# Patient Record
Sex: Female | Born: 1935 | Race: Black or African American | Hispanic: No | Smoking: Former smoker
Health system: Southern US, Community
[De-identification: ages and names within clinical notes are randomized; demographics above are authoritative.]

## PROBLEM LIST (undated history)

## (undated) DIAGNOSIS — K5900 Constipation, unspecified: Secondary | ICD-10-CM

## (undated) DIAGNOSIS — E11649 Type 2 diabetes mellitus with hypoglycemia without coma: Secondary | ICD-10-CM

## (undated) DIAGNOSIS — K219 Gastro-esophageal reflux disease without esophagitis: Secondary | ICD-10-CM

## (undated) DIAGNOSIS — R600 Localized edema: Secondary | ICD-10-CM

## (undated) DIAGNOSIS — I509 Heart failure, unspecified: Secondary | ICD-10-CM

## (undated) DIAGNOSIS — E875 Hyperkalemia: Secondary | ICD-10-CM

## (undated) DIAGNOSIS — R0902 Hypoxemia: Secondary | ICD-10-CM

## (undated) DIAGNOSIS — I442 Atrioventricular block, complete: Secondary | ICD-10-CM

## (undated) DIAGNOSIS — I428 Other cardiomyopathies: Secondary | ICD-10-CM

## (undated) DIAGNOSIS — C73 Malignant neoplasm of thyroid gland: Secondary | ICD-10-CM

## (undated) DIAGNOSIS — M47816 Spondylosis without myelopathy or radiculopathy, lumbar region: Secondary | ICD-10-CM

## (undated) DIAGNOSIS — R1310 Dysphagia, unspecified: Secondary | ICD-10-CM

## (undated) DIAGNOSIS — E785 Hyperlipidemia, unspecified: Secondary | ICD-10-CM

## (undated) DIAGNOSIS — M4310 Spondylolisthesis, site unspecified: Secondary | ICD-10-CM

## (undated) DIAGNOSIS — E119 Type 2 diabetes mellitus without complications: Secondary | ICD-10-CM

## (undated) DIAGNOSIS — E669 Obesity, unspecified: Secondary | ICD-10-CM

## (undated) DIAGNOSIS — J42 Unspecified chronic bronchitis: Secondary | ICD-10-CM

## (undated) DIAGNOSIS — I130 Hypertensive heart and chronic kidney disease with heart failure and stage 1 through stage 4 chronic kidney disease, or unspecified chronic kidney disease: Secondary | ICD-10-CM

## (undated) DIAGNOSIS — K08409 Partial loss of teeth, unspecified cause, unspecified class: Secondary | ICD-10-CM

## (undated) DIAGNOSIS — Z7901 Long term (current) use of anticoagulants: Secondary | ICD-10-CM

## (undated) DIAGNOSIS — D509 Iron deficiency anemia, unspecified: Secondary | ICD-10-CM

## (undated) DIAGNOSIS — H409 Unspecified glaucoma: Secondary | ICD-10-CM

## (undated) DIAGNOSIS — R42 Dizziness and giddiness: Secondary | ICD-10-CM

## (undated) DIAGNOSIS — R569 Unspecified convulsions: Secondary | ICD-10-CM

## (undated) DIAGNOSIS — M1A30X0 Chronic gout due to renal impairment, unspecified site, without tophus (tophi): Secondary | ICD-10-CM

## (undated) DIAGNOSIS — N390 Urinary tract infection, site not specified: Secondary | ICD-10-CM

## (undated) DIAGNOSIS — G4700 Insomnia, unspecified: Secondary | ICD-10-CM

## (undated) DIAGNOSIS — N181 Chronic kidney disease, stage 1: Secondary | ICD-10-CM

## (undated) DIAGNOSIS — D126 Benign neoplasm of colon, unspecified: Secondary | ICD-10-CM

## (undated) DIAGNOSIS — M479 Spondylosis, unspecified: Secondary | ICD-10-CM

## (undated) DIAGNOSIS — I83893 Varicose veins of bilateral lower extremities with other complications: Secondary | ICD-10-CM

## (undated) DIAGNOSIS — Z950 Presence of cardiac pacemaker: Secondary | ICD-10-CM

## (undated) DIAGNOSIS — E1165 Type 2 diabetes mellitus with hyperglycemia: Secondary | ICD-10-CM

## (undated) DIAGNOSIS — J449 Chronic obstructive pulmonary disease, unspecified: Secondary | ICD-10-CM

## (undated) DIAGNOSIS — T7840XA Allergy, unspecified, initial encounter: Secondary | ICD-10-CM

## (undated) DIAGNOSIS — E039 Hypothyroidism, unspecified: Secondary | ICD-10-CM

## (undated) DIAGNOSIS — Z952 Presence of prosthetic heart valve: Secondary | ICD-10-CM

## (undated) DIAGNOSIS — M109 Gout, unspecified: Secondary | ICD-10-CM

## (undated) DIAGNOSIS — R41841 Cognitive communication deficit: Secondary | ICD-10-CM

## (undated) DIAGNOSIS — H269 Unspecified cataract: Secondary | ICD-10-CM

## (undated) HISTORY — DX: Cognitive communication deficit: R41.841

## (undated) HISTORY — DX: Hyperlipidemia, unspecified: E78.5

## (undated) HISTORY — DX: Unspecified cataract: H26.9

## (undated) HISTORY — DX: Spondylolisthesis, site unspecified: M43.10

## (undated) HISTORY — DX: Unspecified chronic bronchitis: J42

## (undated) HISTORY — DX: Localized edema: R60.0

## (undated) HISTORY — DX: Varicose veins of bilateral lower extremities with other complications: I83.893

## (undated) HISTORY — PX: INSERT / REPLACE / REMOVE PACEMAKER: SUR710

## (undated) HISTORY — DX: Spondylosis without myelopathy or radiculopathy, lumbar region: M47.816

## (undated) HISTORY — DX: Iron deficiency anemia, unspecified: D50.9

## (undated) HISTORY — DX: Presence of prosthetic heart valve: Z95.2

## (undated) HISTORY — DX: Hypertensive heart and chronic kidney disease with heart failure and stage 1 through stage 4 chronic kidney disease, or unspecified chronic kidney disease: I13.0

## (undated) HISTORY — DX: Type 2 diabetes mellitus with hypoglycemia without coma: E11.649

## (undated) HISTORY — DX: Chronic obstructive pulmonary disease, unspecified: J44.9

## (undated) HISTORY — DX: Chronic gout due to renal impairment, unspecified site, without tophus (tophi): M1A.30X0

## (undated) HISTORY — DX: Type 2 diabetes mellitus without complications: E11.9

## (undated) HISTORY — DX: Hypoxemia: R09.02

## (undated) HISTORY — DX: Dysphagia, unspecified: R13.10

## (undated) HISTORY — DX: Unspecified convulsions: R56.9

## (undated) HISTORY — DX: Hyperkalemia: E87.5

## (undated) HISTORY — DX: Hypothyroidism, unspecified: E03.9

## (undated) HISTORY — DX: Long term (current) use of anticoagulants: Z79.01

## (undated) HISTORY — DX: Gastro-esophageal reflux disease without esophagitis: K21.9

## (undated) HISTORY — PX: DOPPLER ECHOCARDIOGRAPHY: SHX263

## (undated) HISTORY — PX: CARDIAC CATHETERIZATION: SHX172

## (undated) HISTORY — DX: Chronic kidney disease, stage 1: N18.1

## (undated) HISTORY — PX: THYROIDECTOMY: SHX17

## (undated) HISTORY — DX: Heart failure, unspecified: I50.9

## (undated) HISTORY — PX: CARDIAC VALVE REPLACEMENT: SHX585

## (undated) HISTORY — DX: Malignant neoplasm of thyroid gland: C73

## (undated) HISTORY — PX: OTHER SURGICAL HISTORY: SHX169

## (undated) HISTORY — DX: Obesity, unspecified: E66.9

## (undated) HISTORY — DX: Unspecified glaucoma: H40.9

## (undated) HISTORY — DX: Urinary tract infection, site not specified: N39.0

## (undated) HISTORY — DX: Presence of cardiac pacemaker: Z95.0

## (undated) HISTORY — DX: Allergy, unspecified, initial encounter: T78.40XA

## (undated) HISTORY — DX: Atrioventricular block, complete: I44.2

## (undated) HISTORY — DX: Spondylosis, unspecified: M47.9

## (undated) HISTORY — DX: Partial loss of teeth, unspecified cause, unspecified class: K08.409

## (undated) HISTORY — DX: Benign neoplasm of colon, unspecified: D12.6

---
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
## 1999-09-26 HISTORY — PX: AORTIC AND MITRAL VALVE REPLACEMENT: SHX5056

## 2002-09-25 HISTORY — PX: OTHER SURGICAL HISTORY: SHX169

## 2002-12-16 DIAGNOSIS — D126 Benign neoplasm of colon, unspecified: Secondary | ICD-10-CM

## 2002-12-16 HISTORY — DX: Benign neoplasm of colon, unspecified: D12.6

## 2002-12-16 HISTORY — PX: COLONOSCOPY: SHX174

## 2003-09-26 HISTORY — PX: EYE SURGERY: SHX253

## 2003-10-29 ENCOUNTER — Ambulatory Visit (HOSPITAL_COMMUNITY): Admission: RE | Admit: 2003-10-29 | Discharge: 2003-10-29 | Payer: Self-pay | Admitting: Internal Medicine

## 2003-12-07 ENCOUNTER — Ambulatory Visit (HOSPITAL_COMMUNITY): Admission: RE | Admit: 2003-12-07 | Discharge: 2003-12-07 | Payer: Self-pay | Admitting: Internal Medicine

## 2004-02-29 ENCOUNTER — Ambulatory Visit (HOSPITAL_COMMUNITY): Admission: RE | Admit: 2004-02-29 | Discharge: 2004-02-29 | Payer: Self-pay | Admitting: Ophthalmology

## 2004-03-23 ENCOUNTER — Ambulatory Visit (HOSPITAL_COMMUNITY): Admission: RE | Admit: 2004-03-23 | Discharge: 2004-03-23 | Payer: Self-pay | Admitting: Internal Medicine

## 2004-03-30 ENCOUNTER — Encounter (HOSPITAL_COMMUNITY): Admission: RE | Admit: 2004-03-30 | Discharge: 2004-04-29 | Payer: Self-pay | Admitting: Internal Medicine

## 2004-05-05 ENCOUNTER — Emergency Department (HOSPITAL_COMMUNITY): Admission: EM | Admit: 2004-05-05 | Discharge: 2004-05-05 | Payer: Self-pay | Admitting: Emergency Medicine

## 2004-06-30 ENCOUNTER — Ambulatory Visit (HOSPITAL_COMMUNITY): Admission: RE | Admit: 2004-06-30 | Discharge: 2004-06-30 | Payer: Self-pay | Admitting: Orthopedic Surgery

## 2004-08-31 ENCOUNTER — Ambulatory Visit: Payer: Self-pay | Admitting: Family Medicine

## 2004-09-05 IMAGING — CR DG CHEST 2V
3 series · 3 of 3 positions shown · non-contrast
Comparison: none

CLINICAL DATA: Bronchitis.
 TWO VIEW CHEST
 PA and lateral views of the chest without previous films available at this time for comparison show cardiomegaly with bipolar pacemaker electrode system which appears to be intact within the right atrium and the right ventricle.  There are noted two prosthetic aortic valves, which appear probably to be within the aorta and the mitral valves.  The heart is moderately enlarged.  Aorta is mildly elongated, dilated and calcified.  The lungs show generalized peribronchial thickening, consistent with chronic bronchitis, but no definite edema, pleural effusion or pneumothorax.  There is flattening of the hemidiaphragms and some increased AP diameter of the chest consistent with moderate chronic obstructive lung changes.  The bones show osteopenia and there is an anterior narrowing which appears to be old of one of the mid thoracic vertebral bodies, probably T7.  
 IMPRESSION
 Cardiomegaly, previous valve replacement.  Intact bipolar pacer system.  Chronic bronchitis.  Chronic obstructive pulmonary disease.  No definite active infiltrate is seen.

[view not recorded (1 of 3)]
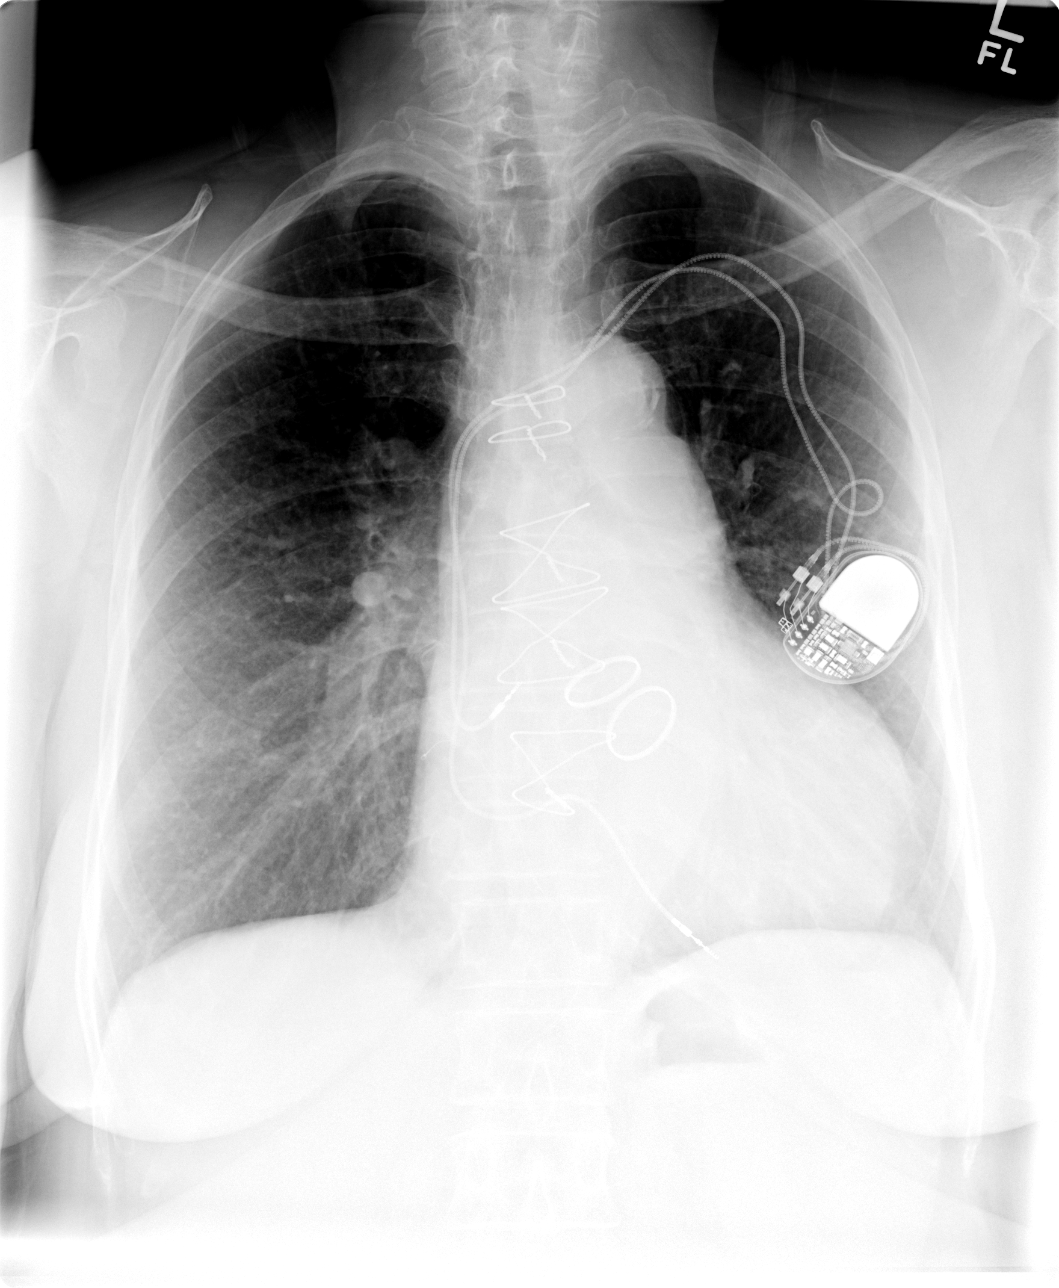

[view not recorded (2 of 3)]
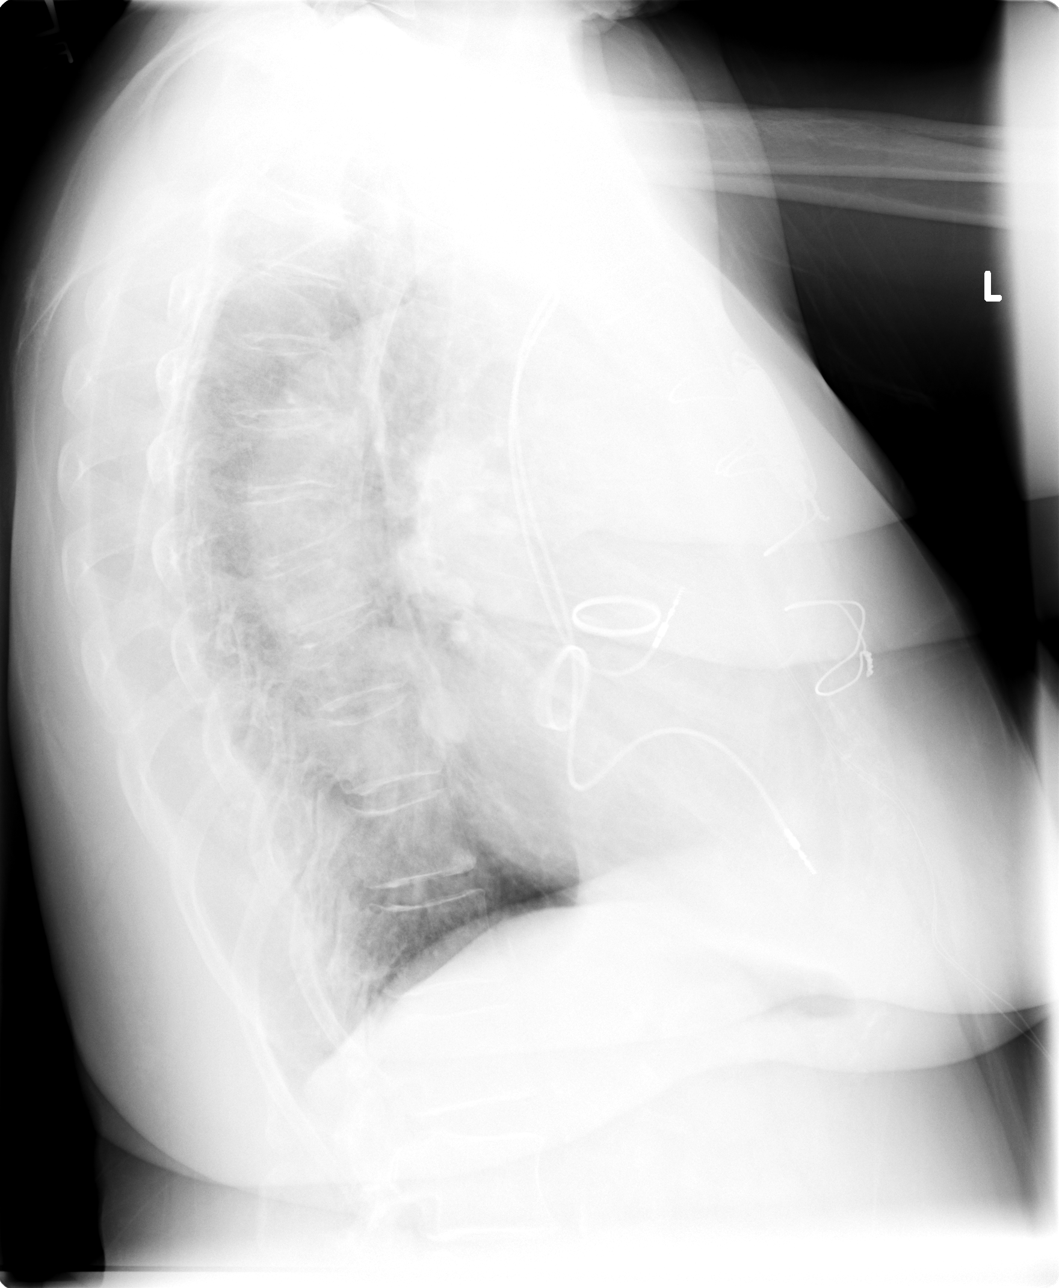

[view not recorded (3 of 3)]
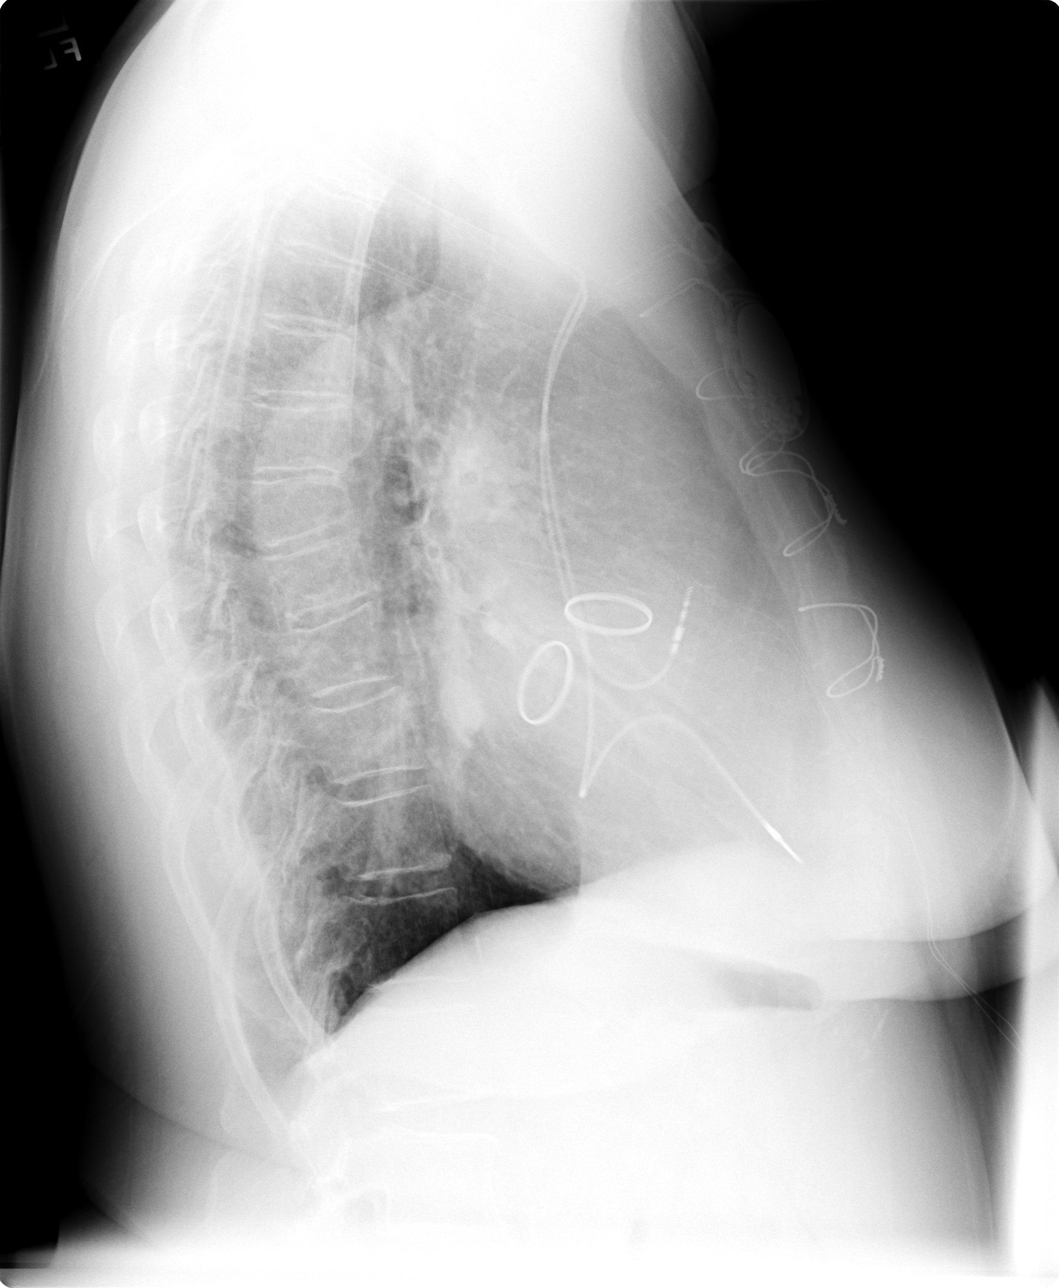

[3 of 3 positions shown; findings below may reference images not displayed]

## 2004-09-07 ENCOUNTER — Ambulatory Visit (HOSPITAL_COMMUNITY): Admission: RE | Admit: 2004-09-07 | Discharge: 2004-09-07 | Payer: Self-pay | Admitting: Family Medicine

## 2004-09-25 DIAGNOSIS — N181 Chronic kidney disease, stage 1: Secondary | ICD-10-CM

## 2004-09-25 DIAGNOSIS — E119 Type 2 diabetes mellitus without complications: Secondary | ICD-10-CM

## 2004-09-25 HISTORY — PX: EYE SURGERY: SHX253

## 2004-09-25 HISTORY — DX: Chronic kidney disease, stage 1: N18.1

## 2004-09-25 HISTORY — DX: Type 2 diabetes mellitus without complications: E11.9

## 2004-09-28 ENCOUNTER — Ambulatory Visit: Payer: Self-pay | Admitting: Family Medicine

## 2004-10-31 ENCOUNTER — Ambulatory Visit (HOSPITAL_COMMUNITY): Admission: RE | Admit: 2004-10-31 | Discharge: 2004-10-31 | Payer: Self-pay | Admitting: Ophthalmology

## 2004-11-23 ENCOUNTER — Ambulatory Visit: Payer: Self-pay | Admitting: Family Medicine

## 2004-12-08 ENCOUNTER — Ambulatory Visit (HOSPITAL_COMMUNITY): Admission: RE | Admit: 2004-12-08 | Discharge: 2004-12-08 | Payer: Self-pay | Admitting: Family Medicine

## 2005-01-25 ENCOUNTER — Inpatient Hospital Stay (HOSPITAL_COMMUNITY): Admission: EM | Admit: 2005-01-25 | Discharge: 2005-02-05 | Payer: Self-pay | Admitting: Emergency Medicine

## 2005-01-29 IMAGING — CR DG CERVICAL SPINE COMPLETE 4+V
6 series · 6 of 6 positions shown · non-contrast
Comparison: none

CLINICAL DATA: Neck pain.
 SIX VIEW CERVICAL SPINE ? 03/23/2004
 No prior studies.

[view not recorded (1 of 6)]
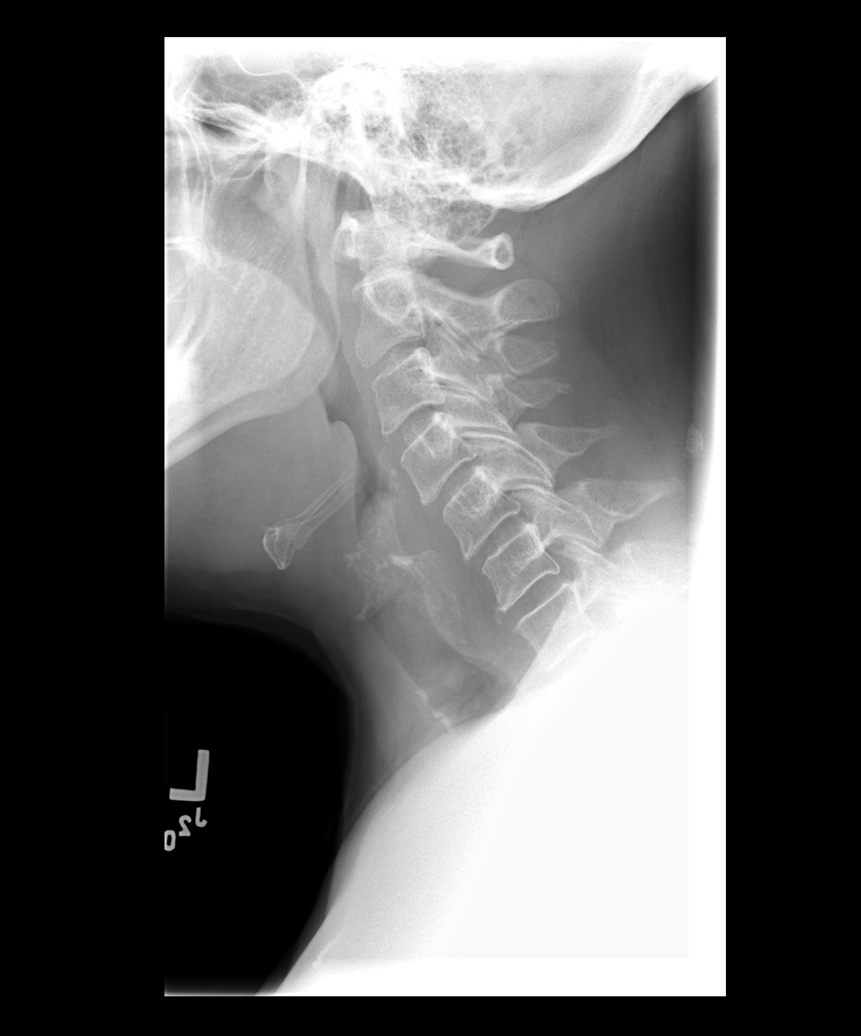

[view not recorded (2 of 6)]
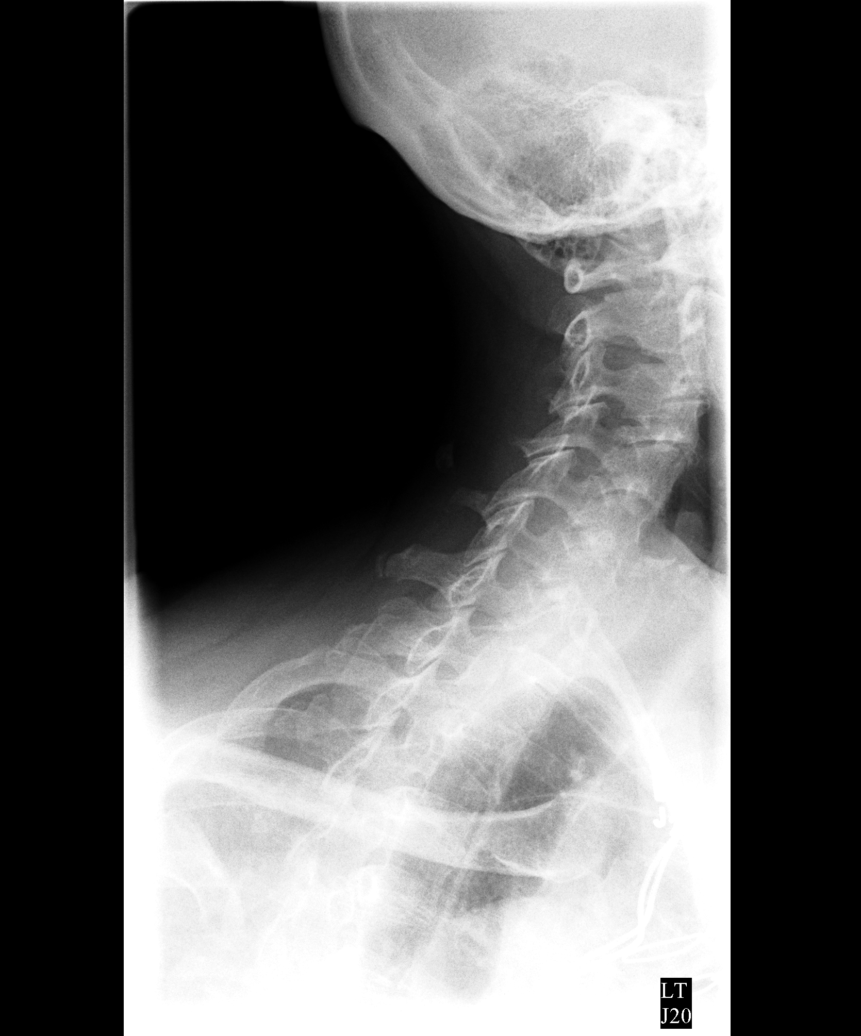

[view not recorded (3 of 6)]
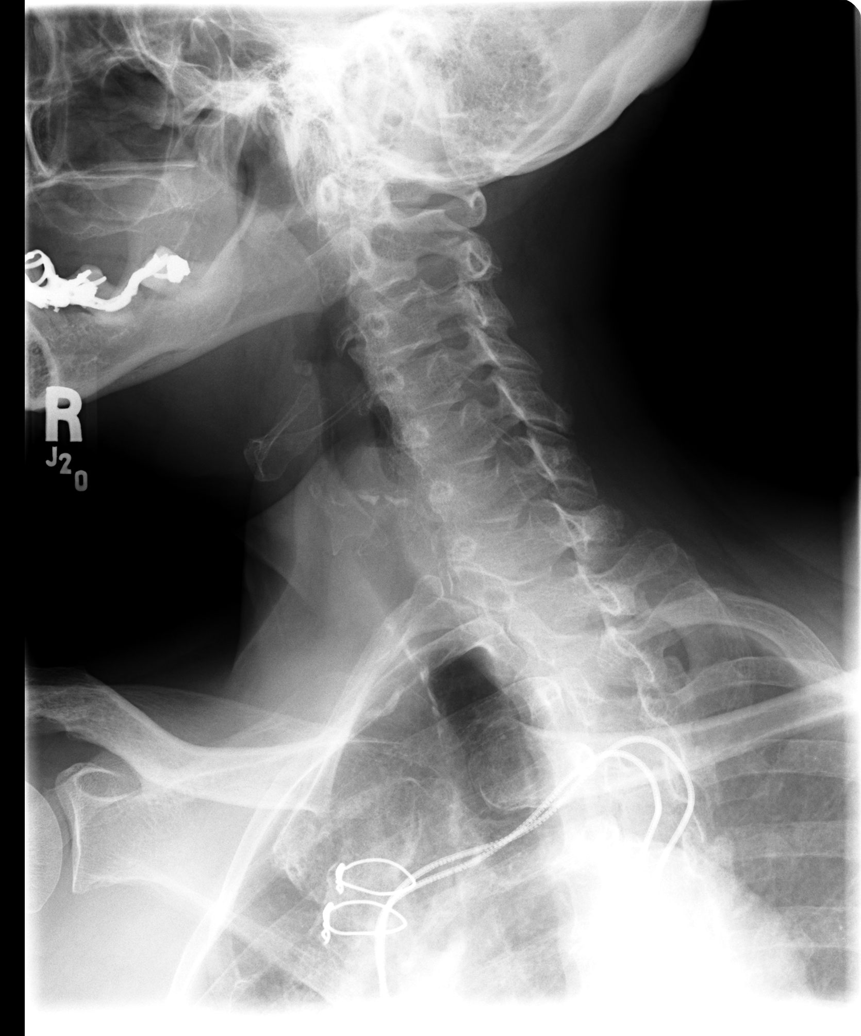

[view not recorded (4 of 6)]
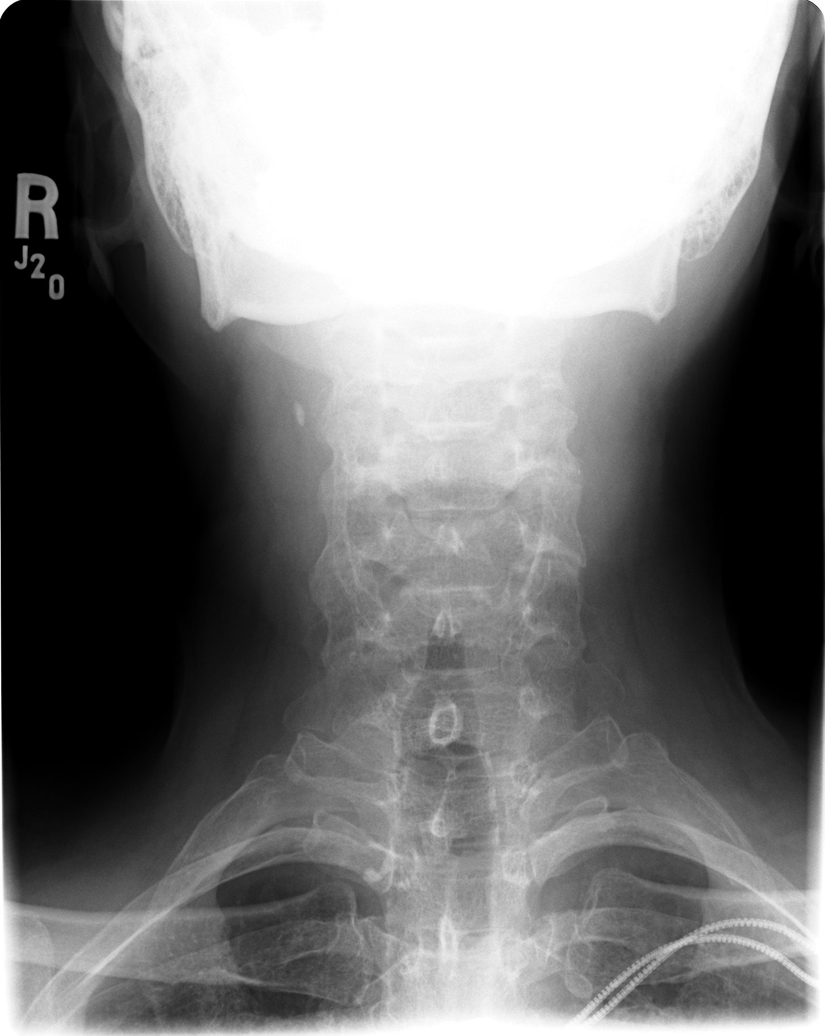

[view not recorded (5 of 6)]
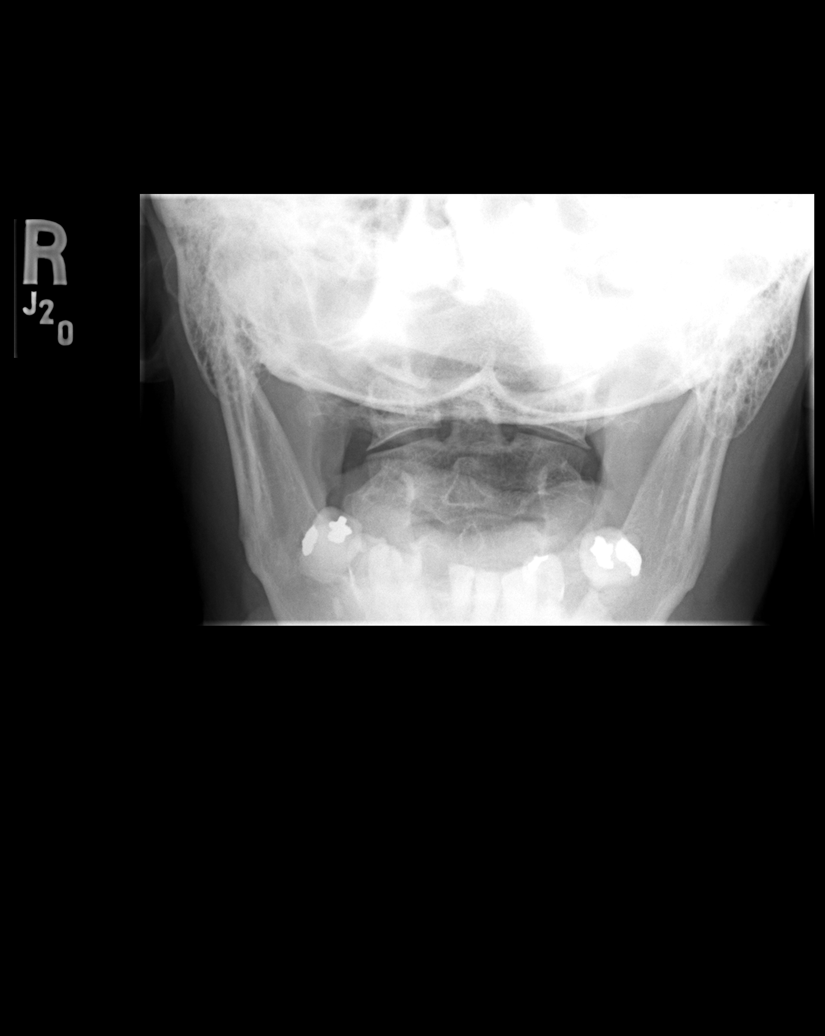

[view not recorded (6 of 6)]
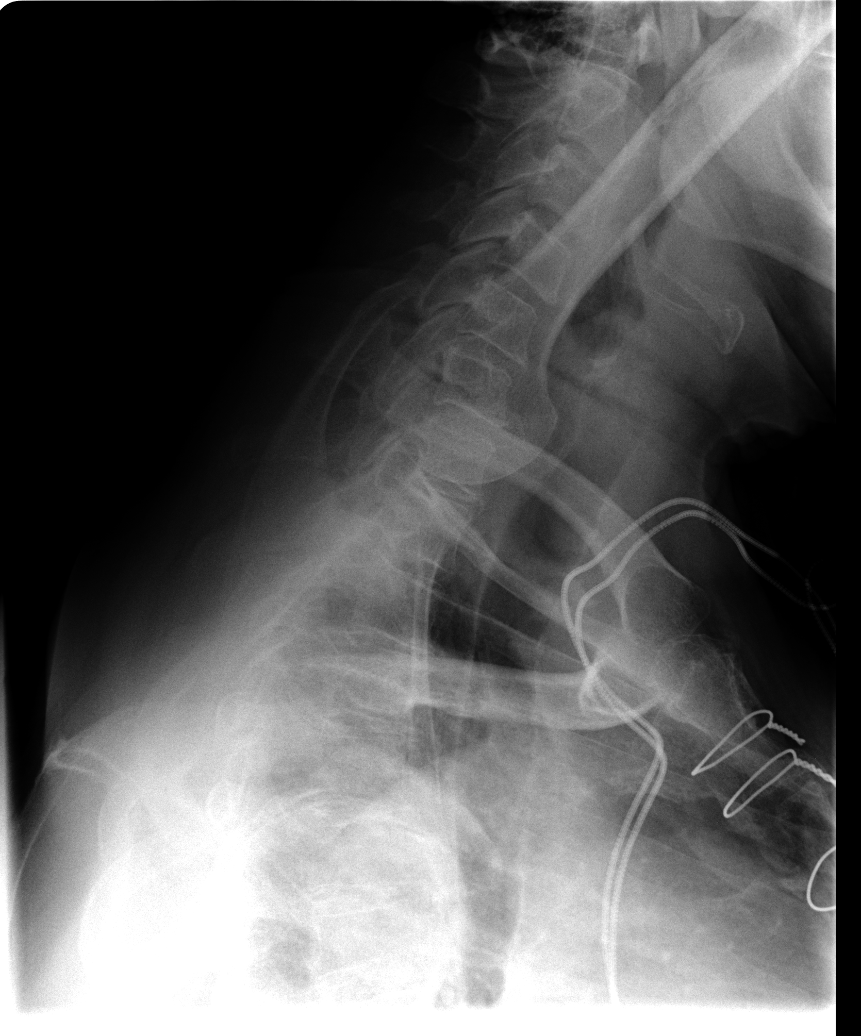

[6 of 6 positions shown; findings below may reference images not displayed]

FINDINGS: There is 1 to 2 mm of degenerative posterior subluxation of C3 on C4.  There is some mild cervical spondylosis, with spurring at C5-6 and C6-7.  No prevertebral soft tissue swelling.  No visible fracture.  There is some facet and uncovertebral spurring on the right at C3-4 potentially leading to mild foraminal narrowing.  Pacer leads are noted.  
 IMPRESSION
 Cervical spondylosis as detailed above.  No visible fracture or signs of acute subluxation.

## 2005-02-14 ENCOUNTER — Inpatient Hospital Stay (HOSPITAL_COMMUNITY): Admission: AD | Admit: 2005-02-14 | Discharge: 2005-02-18 | Payer: Self-pay | Admitting: Internal Medicine

## 2005-02-14 ENCOUNTER — Ambulatory Visit: Payer: Self-pay | Admitting: Family Medicine

## 2005-02-14 ENCOUNTER — Ambulatory Visit (HOSPITAL_COMMUNITY): Admission: RE | Admit: 2005-02-14 | Discharge: 2005-02-14 | Payer: Self-pay | Admitting: Family Medicine

## 2005-02-28 ENCOUNTER — Ambulatory Visit: Payer: Self-pay | Admitting: Family Medicine

## 2005-03-13 IMAGING — CR DG HUMERUS 2V *L*
2 series · 2 of 2 positions shown · non-contrast
Comparison: none

CLINICAL DATA: Arm pain.  
 LEFT HUMERUS (TWO VIEWS)
 Normal alignment.  No fracture.  Calcific tendinitis is noted adjacent to the greater tuberosity. 
 IMPRESSION
 Negative for fracture. 
 Calcific tendinitis.

[view not recorded (1 of 2)]
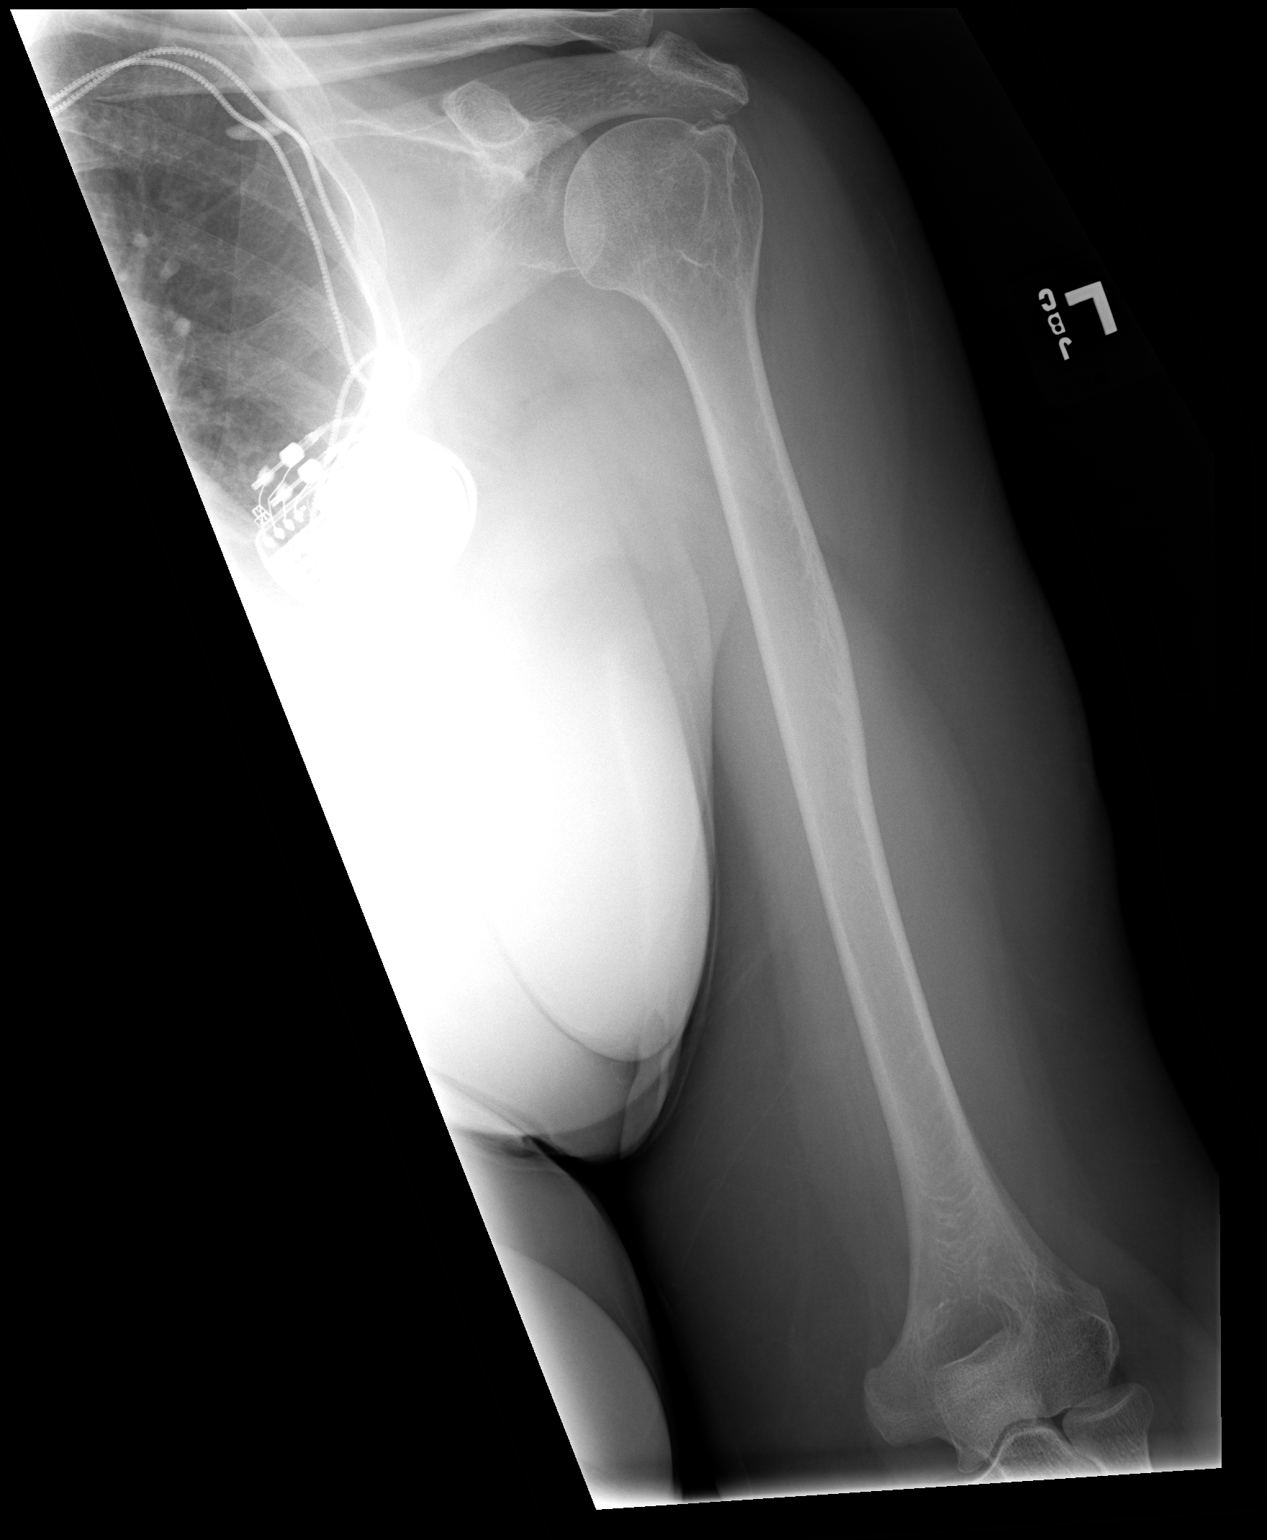

[view not recorded (2 of 2)]
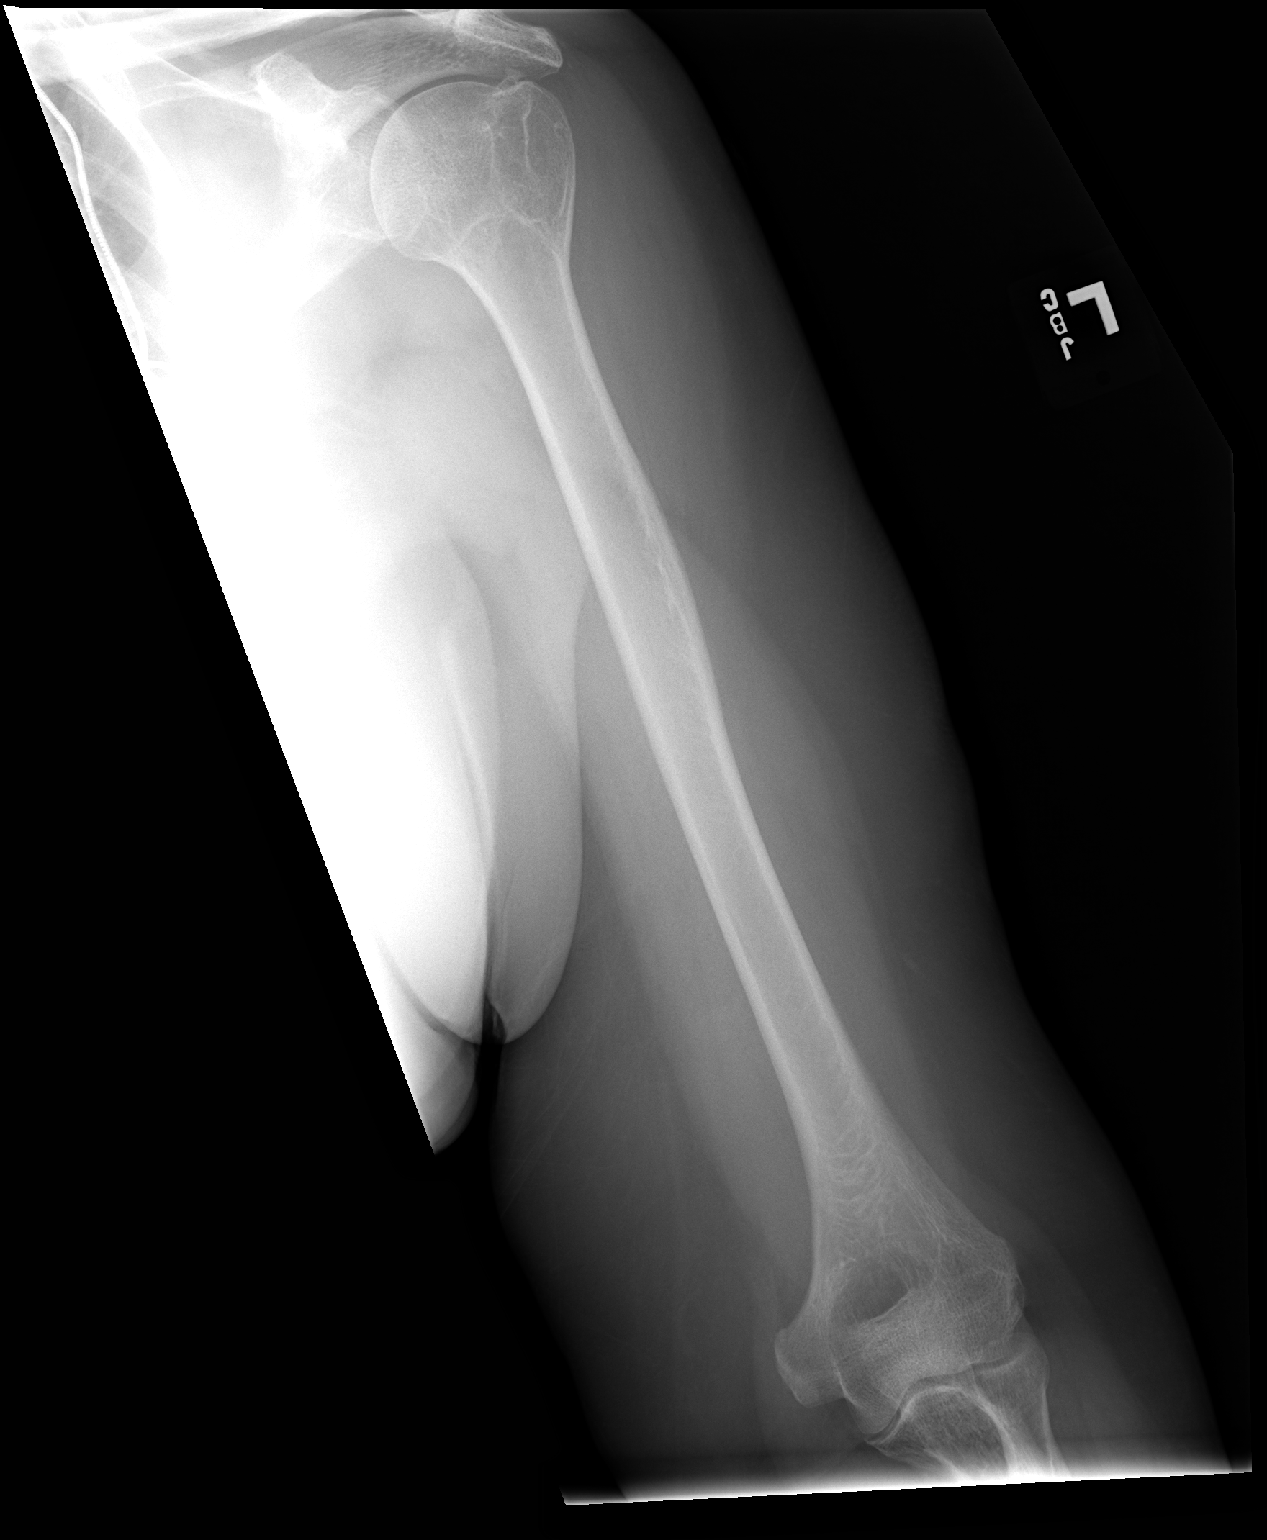

[2 of 2 positions shown; findings below may reference images not displayed]

## 2005-03-22 ENCOUNTER — Ambulatory Visit: Payer: Self-pay | Admitting: Pediatrics

## 2005-04-12 ENCOUNTER — Inpatient Hospital Stay (HOSPITAL_COMMUNITY): Admission: AD | Admit: 2005-04-12 | Discharge: 2005-04-20 | Payer: Self-pay | Admitting: *Deleted

## 2005-04-27 ENCOUNTER — Ambulatory Visit: Payer: Self-pay | Admitting: Family Medicine

## 2005-05-08 IMAGING — CT CT L SPINE W/O CM
2 of 13 series · 7 of 27 positions shown, 9 images · non-contrast
Comparison: none

HISTORY: Low back and left leg pain progressive in last 3 to 4 months

[Series 7616: — · axial · 0.34mm/px · z∈[+1469,+1700]mm · 2 of 145 slices shown, 3 images (1 of 2)]
[im 1/145  soft-tissue]
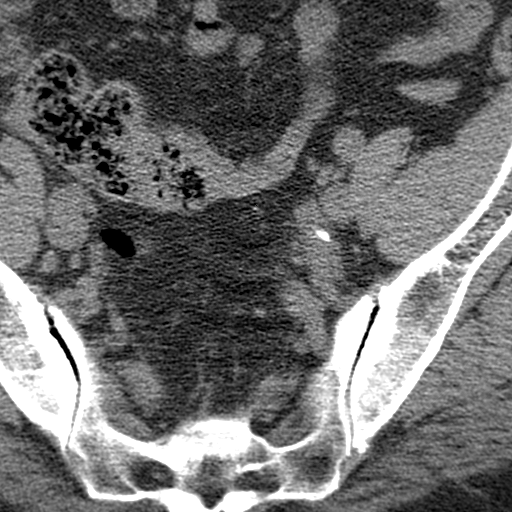
[im 1/145  bone]
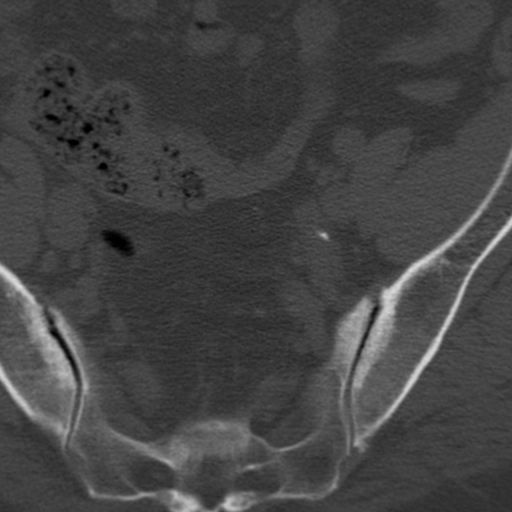
[im 145/145  bone]
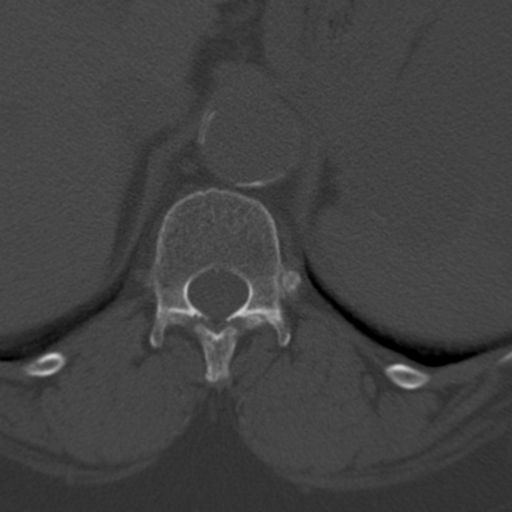

[— · sagittal · 0.45mm/px · 5 of 34 slices shown, 6 images (2 of 2)]
[im 12/34  bone]
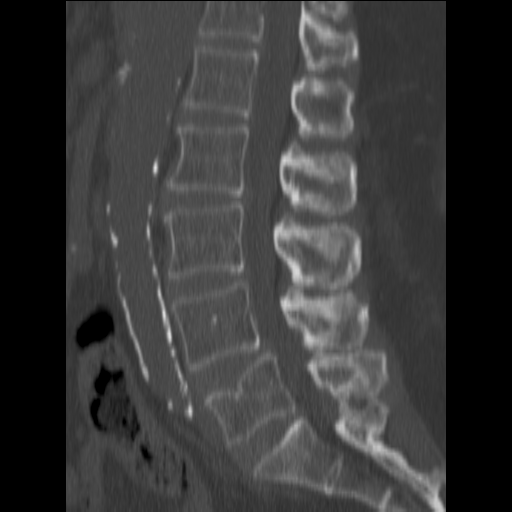
[im 14/34  bone]
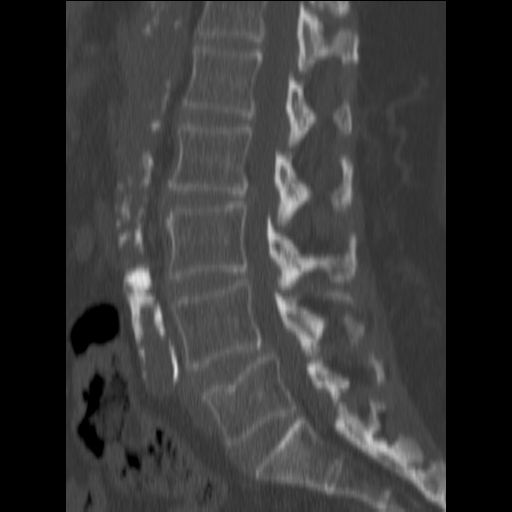
[im 17/34  soft-tissue]
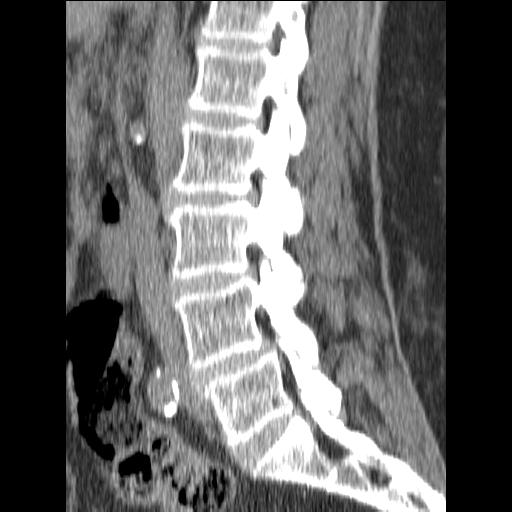
[im 17/34  bone]
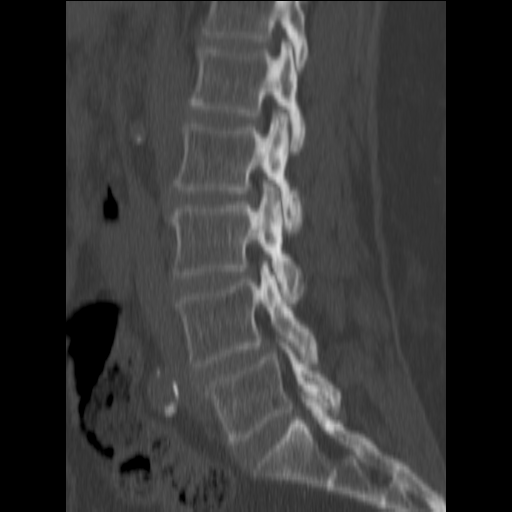
[im 20/34  bone]
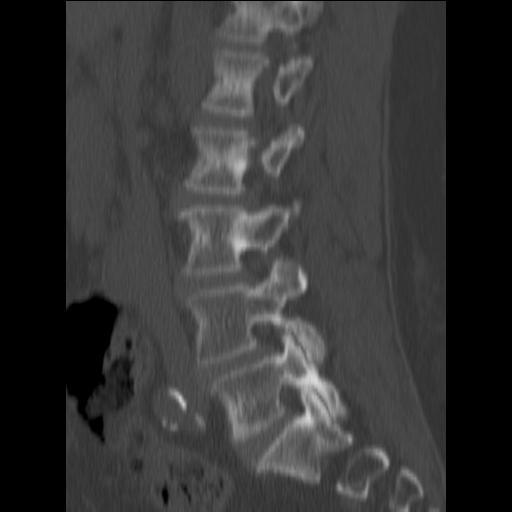
[im 23/34  bone]
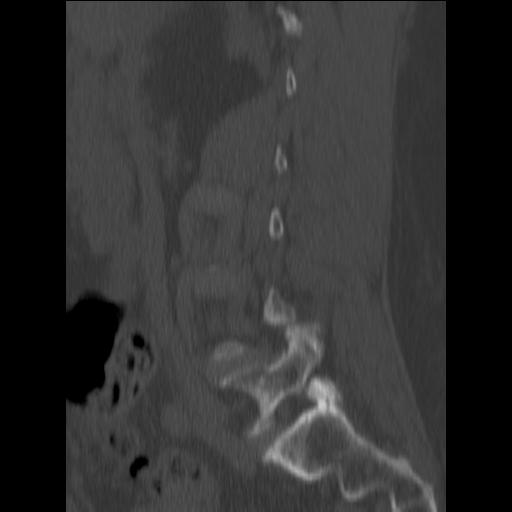

[7 of 27 positions shown; findings below may reference images not displayed]

CT LUMBAR SPINE WITHOUT CONTRAST

Axial noncontrast CT images the lumbar spine performed. Exam correlated with prior MRI lumbar spine
of 05/05/00.

Incidentally noted multiple large masses within spleen. Largest of these measures 10.1 x 6.5 cm in
size. A single mass in the inferior aspect of the spleen, 5.8 x 4.5 cm, image 36, show central
calcification. Malignancy is considered highly suspicious with the above findings and warrants
further evaluation.

Five lumbar vertebrae labeled on previous MRI. Mild bony demineralization without bone destruction
or fracture.
Atherosclerotic calcifications aorta.
Mild scattered facet degenerative changes noted at all levels.
Question few enlarged periportal lymph nodes.
Low attenuation mass posterior mid left kidney, 3.2 x 2.4 cm, image 74, question cyst.
No masses evident within spinal canal by noncontrast CT.

L1-L2: Diffuse disc bulge.

L2-L3: Diffuse disc bulge, flattening ventral aspect of the thecal sac are particularly right of
midline. Residual AP diameter of thecal sac measures 11 mm at midline. No disc herniation or nerve
root compression.

L3-L4: Diffuse disc bulge, flattening thecal sac and creating mild AP spinal stenosis, though
residual diameter of thecal sac measures 11 mm at midline. Bilateral facet hypertrophy. Foramina
remain patent without disc herniation or nerve root compression. 

L4-L5: Bilateral facet degenerative changes. Question broad-based posterior disc herniation versus
pseudo-disc formation with mild anterolisthesis, see below. No focal disc herniation to account for
compression of nerve roots.

L5-S1: Diffuse disc bulge. No focal disc herniation.
IMPRESSION: Question disc herniation versus pseudo-disc at the L4-L5, see below. Multi-level bulging disc and
facet degenerative changes without neural compression. Specifically, no definite cause for
radicular symptoms to the left lower extremities seen.
Probable left renal cyst as above. 
Multiple splenic masses. Recommend CT of the abdomen and pelvis with IV and oral contrast to better assess.

MULTIPLANAR RECONSTRUCTION LUMBAR SPINE CT

Axial data set reformatted into sagittal and oblique axial images. This confirms the presence of
multilevel degenerative disc disease changes with bulging discs. Minimal anterolisthesis, L4-L5, 
noted by prior MRI, with pseudo-disc formation. Chronic superior endplate compression deformity of
L-5 noted, unchanged since MR. No focal disc herniation or nerve root compression at any level. The
greatest degree of AP narrow of the spinal canal occurs at L2-L3, particularly right of midline
extending through right neural foramen by diffusely bulging disc. This lies in close proximity to
the right L-2 root.
IMPRESSION: AP spinal stenosis at L2-L3, L3-4 and L4-L5 due to combination of degenerative disc and facet
disease changes. Mild pseudo-disc formation of L4-5 without focal disc herniation. Please see above
CT lumbar spine report for description of splenic findings.

## 2005-07-16 IMAGING — CT CT ABDOMEN W/ CM
1 of 3 series · 13 of 32 positions shown, 18 images · IV contrast (CONTRAST)
Comparison: none

CLINICAL DATA: Splenic mass/cyst seen on lumbar spine CT.  Please evaluate.  
 CT OF THE ABDOMEN, WITH CONTRAST; 
 Multidetector helical study performed during IV contrast enhancement of 150 cc of Omnipaque 300. There are multiple cystic lesions seen within the spleen which measure 23 Hounsfield units in density.   On the recent CT of the lumbar spine, these lesions measure 21 Hounsfield units of density prior to contrast (do not enhance significantly).  There are however multiple areas of septation and there areas of calcification seen within the lower pole of the kidney consistent with complicated cystic lesion. As a result, I would recommend a follow-up study in 4-6 months to be certain that these remain stable in appearance.  The liver contains a tiny hypodense lesion within its dome (image #15).  This was approximately 4-5 mm in size and most likely represents a tiny cyst.  The pancreas, kidneys and adrenal glands have a normal appearance aside for a simple appearing 3 cm in size lower pole left renal cyst.  The abdominal aorta is partially calcified and normal in size.   There is no adenopathy.  There is a moderate sized hiatal hernia noted.

[Series 4325: — · axial · 0.77mm/px · z∈[+1212,+1612]mm · 13 of 92 slices shown, 18 images]
[im 6/92  soft-tissue]
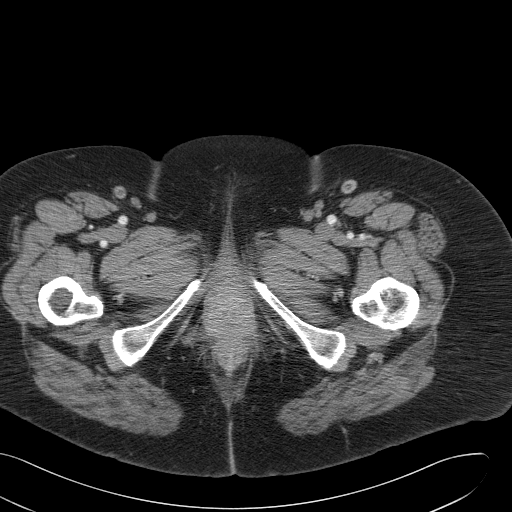
[im 6/92  bone]
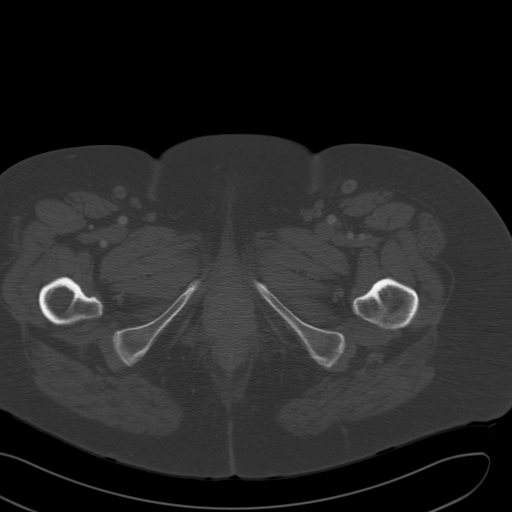
[im 12/92  soft-tissue]
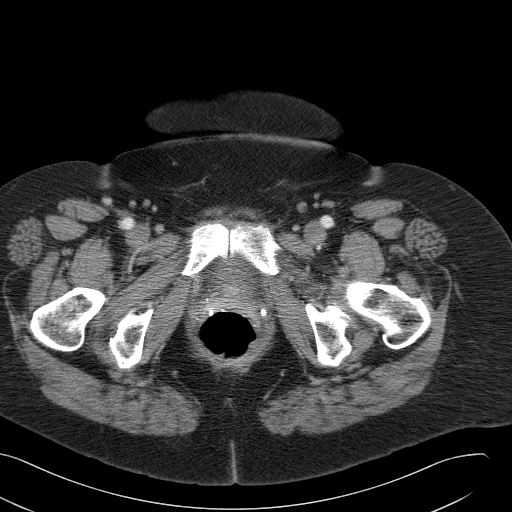
[im 23/92  soft-tissue]
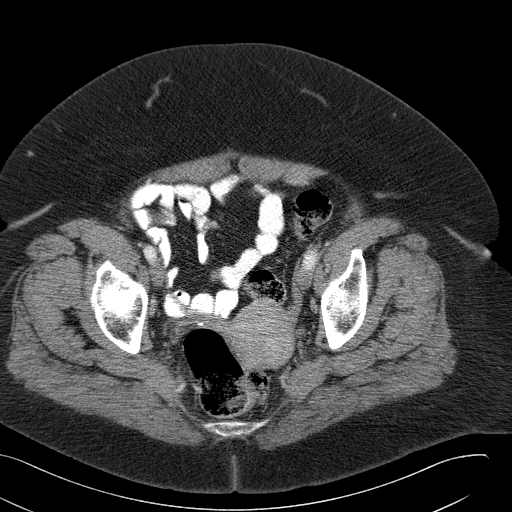
[im 29/92  soft-tissue]
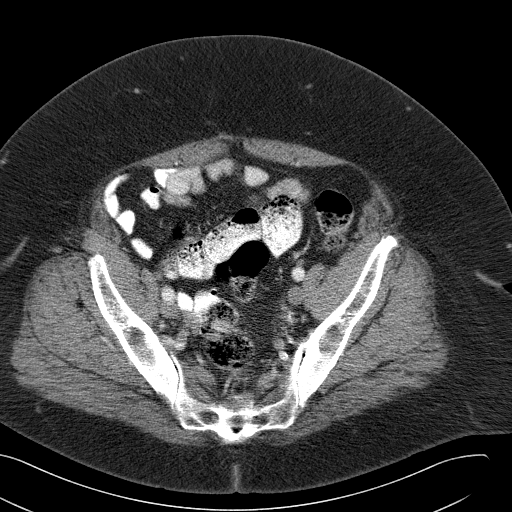
[im 35/92  soft-tissue]
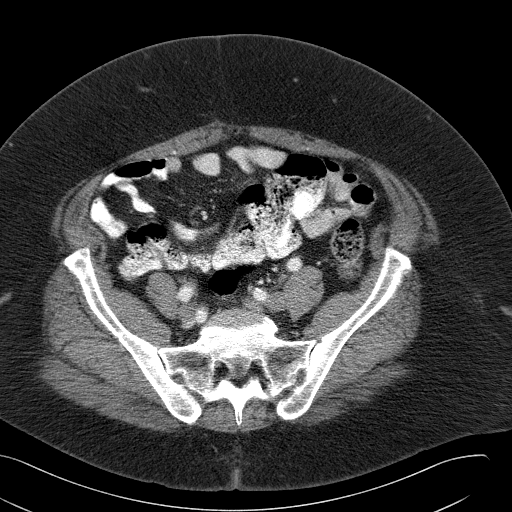
[im 40/92  soft-tissue]
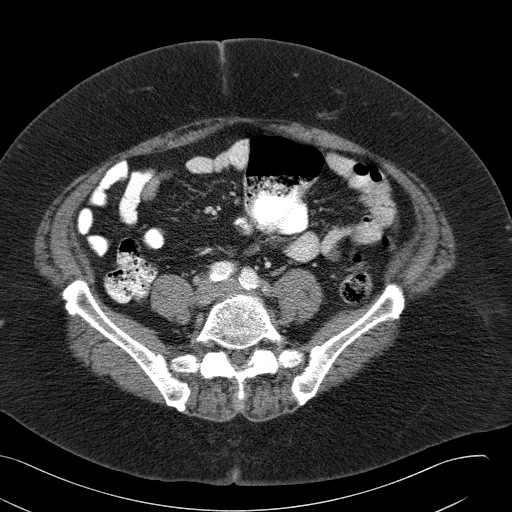
[im 52/92  soft-tissue]
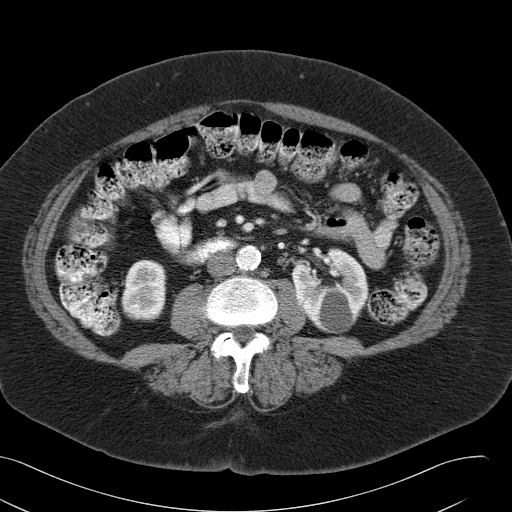
[im 57/92  soft-tissue]
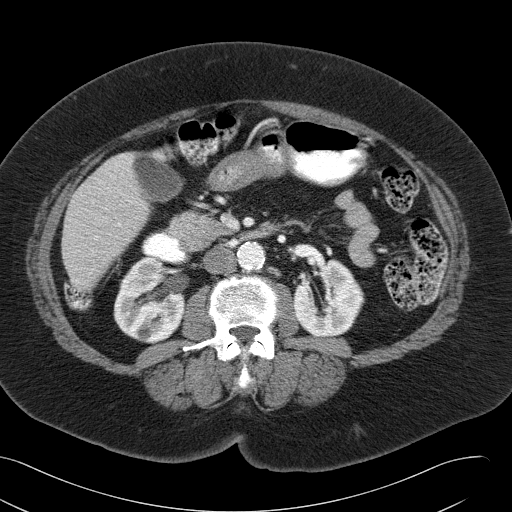
[im 63/92  soft-tissue]
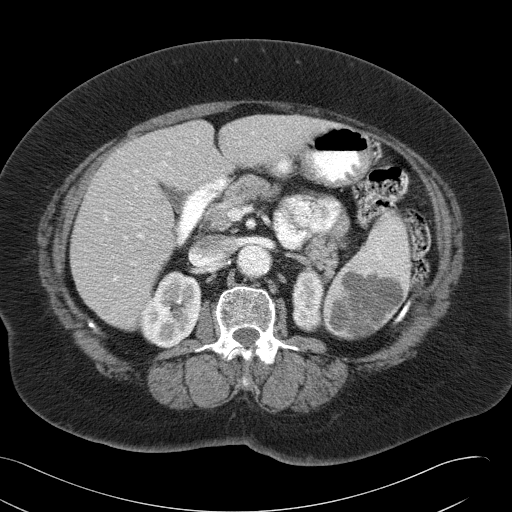
[im 63/92  bone]
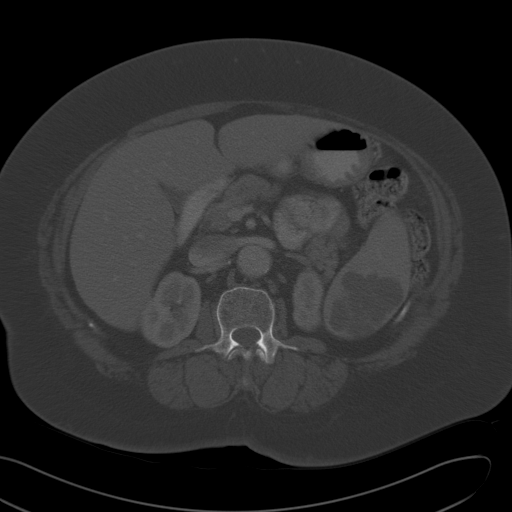
[im 69/92  soft-tissue]
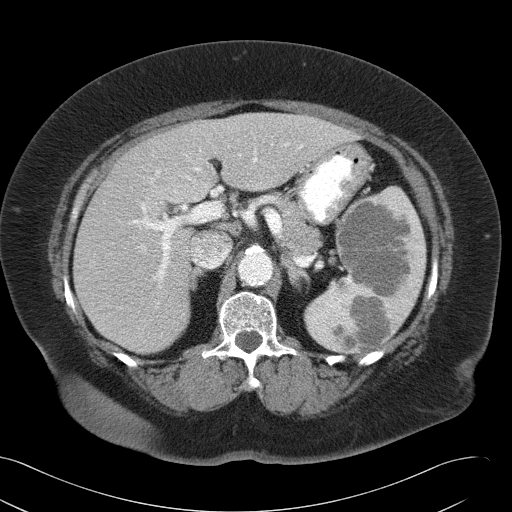
[im 69/92  lung]
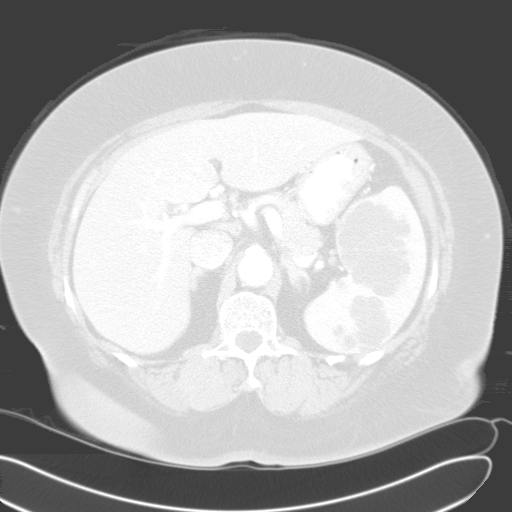
[im 74/92  lung]
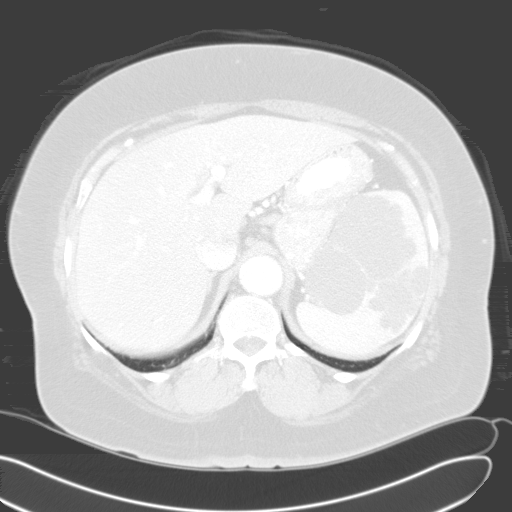
[im 80/92  soft-tissue]
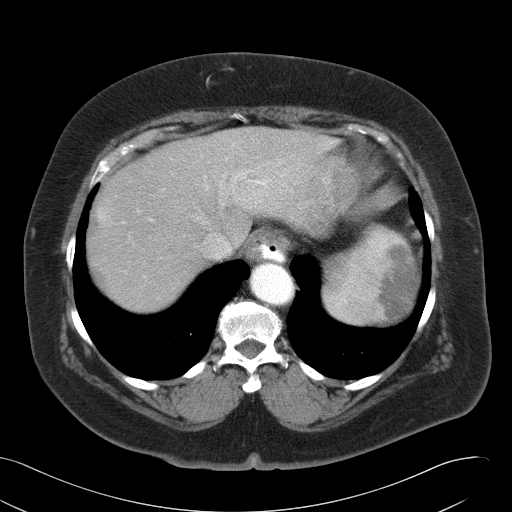
[im 80/92  lung]
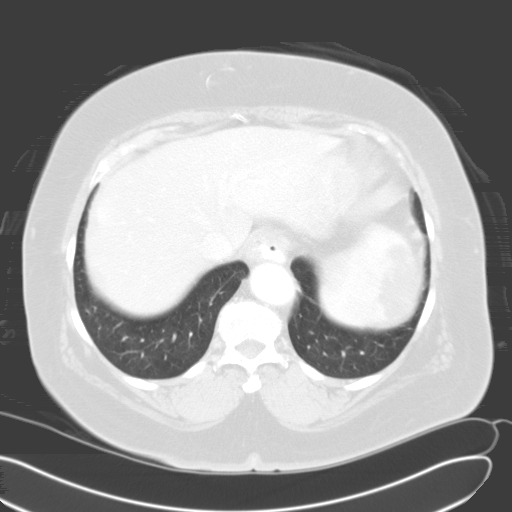
[im 86/92  soft-tissue]
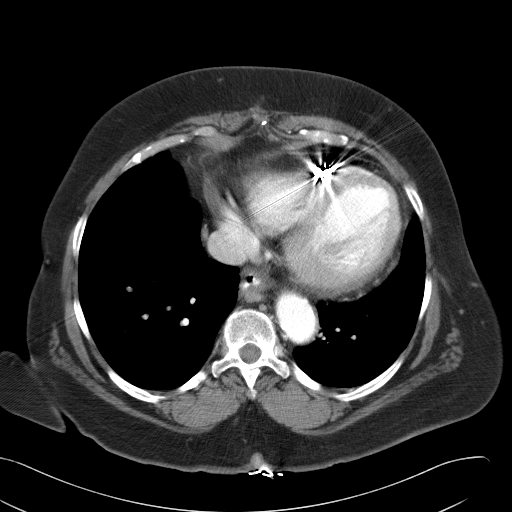
[im 86/92  lung]
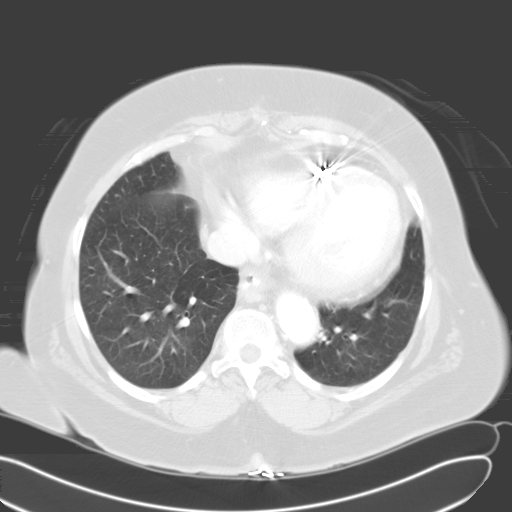

[13 of 32 positions shown; findings below may reference images not displayed]

IMPRESSION: Multiple cystic lesions seen occupying the majority of the spleen with areas of septation and calcification consistent with complicated cystic lesions.  These have not significantly changed when compared with the prior study dated 06/30/04.  However, as this is a relatively short-term follow-up, I would recommend an additional follow-up study in 4-5 months. 
 Tiny probable cyst associated with the dome of the right lobe of liver. 
 Simple appearing lower pole left renal cyst. 
 CT OF THE PELVIS WITH IV CONTRAST:
 There is a calcification seen associated with the uterus most consistent with an area of calcification related to a uterine leiomyoma which had undergone involution.   There is no adenopathy or free pelvic fluid. There is no evidence for an adnexal mass.
IMPRESSION: Calcification associated with the uterus, most consistent with a partially calcified fibroid. Otherwise, negative CT of the pelvis.

## 2005-07-19 ENCOUNTER — Ambulatory Visit: Payer: Self-pay | Admitting: Family Medicine

## 2005-07-27 ENCOUNTER — Ambulatory Visit: Payer: Self-pay | Admitting: Family Medicine

## 2005-09-21 ENCOUNTER — Inpatient Hospital Stay (HOSPITAL_COMMUNITY): Admission: AD | Admit: 2005-09-21 | Discharge: 2005-09-25 | Payer: Self-pay | Admitting: Urology

## 2005-10-02 ENCOUNTER — Ambulatory Visit: Payer: Self-pay | Admitting: Family Medicine

## 2005-11-30 ENCOUNTER — Ambulatory Visit: Payer: Self-pay | Admitting: Family Medicine

## 2005-12-03 IMAGING — CR DG CHEST 2V
2 series · 2 of 2 positions shown · non-contrast
Comparison: none

CLINICAL DATA: Cough, short of breath.  Valve replacements.
 [id number] VIEWS:
 The patient has had previous median sternotomy and mitral and aortic valve replacements.  There is a dual lead pacemaker with the leads grossly well positioned.  Old epicardial leads are in place.  Heart size is normal.  Mediastinum is unremarkable except for calcification of the thoracic aorta.  The vascularity is normal.  No effusions.  There is a minor compression deformity in the mid thoracic spine of indeterminate age.

[w chest pa]
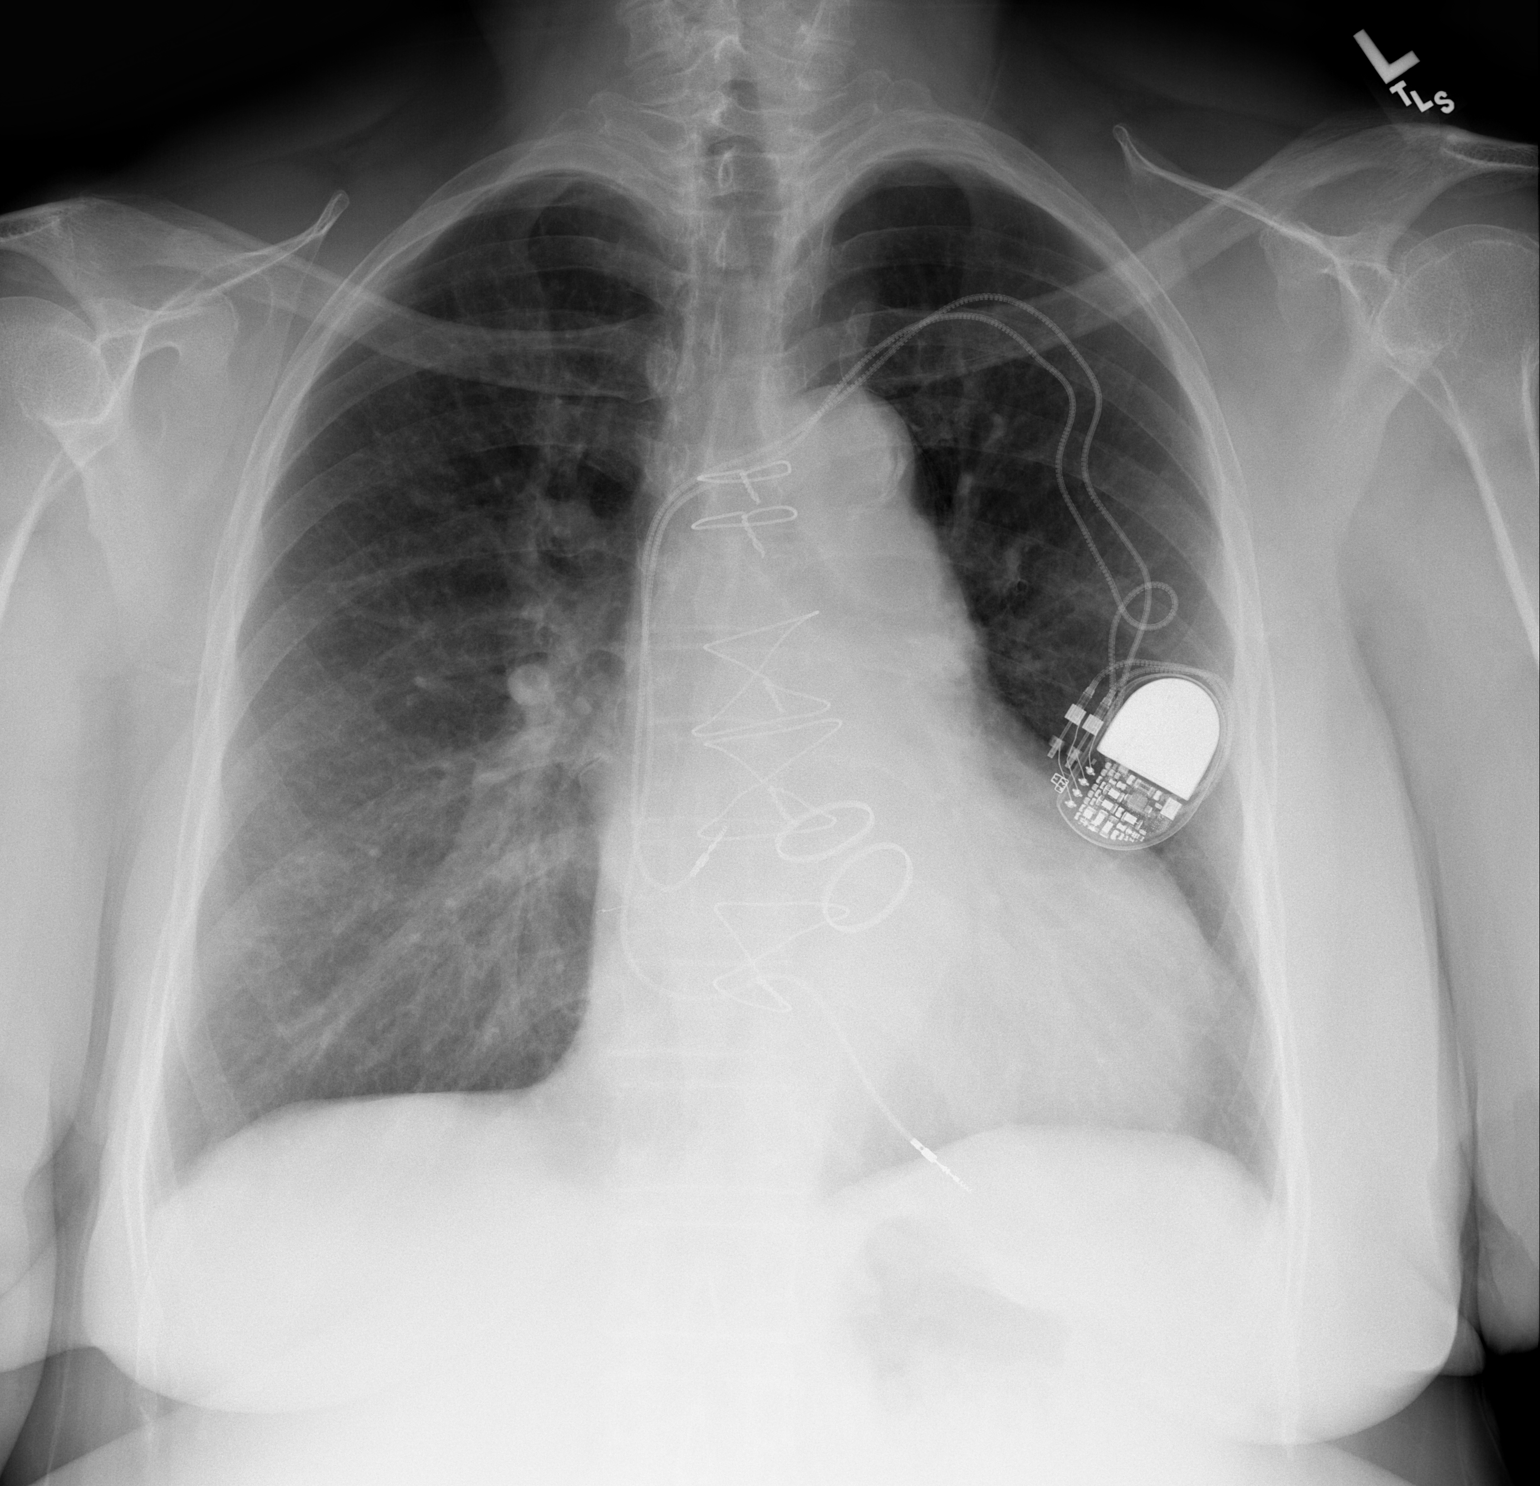

[w chest lat]
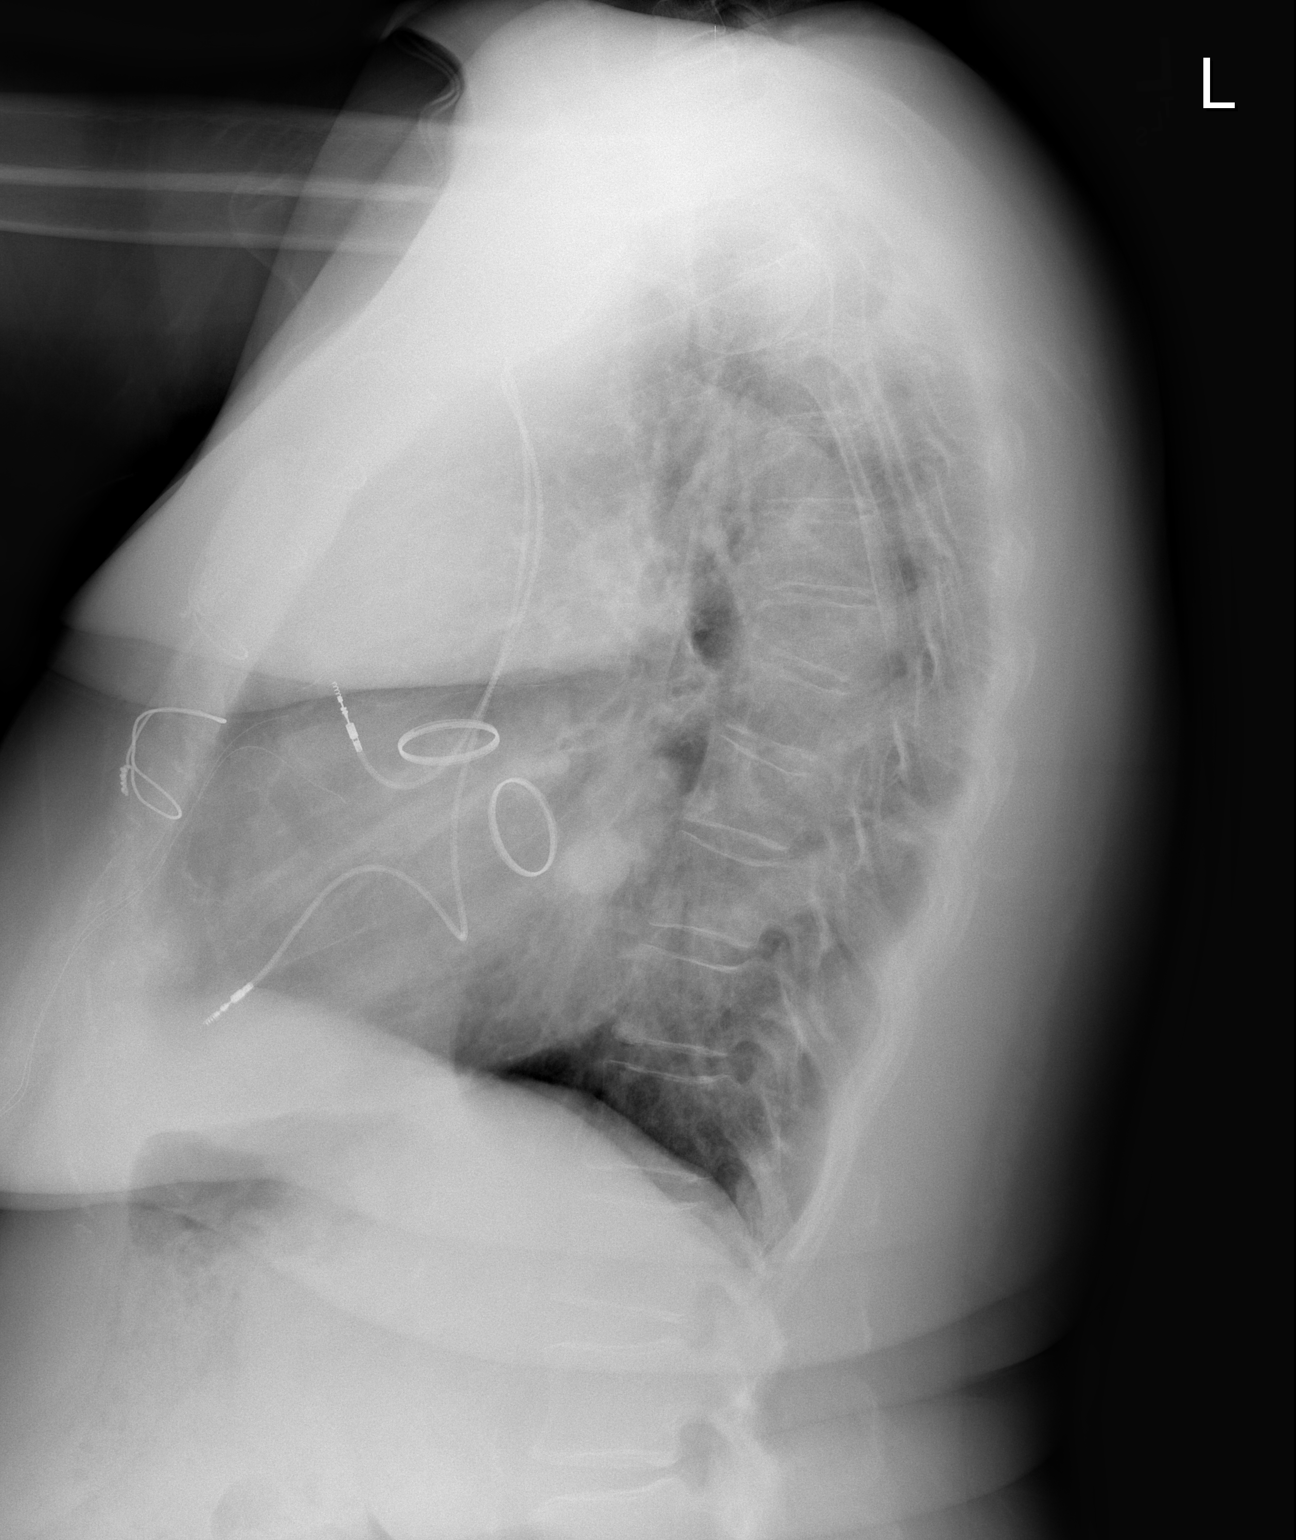

[2 of 2 positions shown; findings below may reference images not displayed]

IMPRESSION: No active disease.

## 2005-12-08 IMAGING — CR DG CHEST 2V
2 series · 2 of 2 positions shown · non-contrast
Comparison: none

CLINICAL DATA: Cough; high INR
 CHEST - 2 VIEW: 
 PA and lateral views of the chest dated 01/30/05 are compared with study of 01/25/05.  Since previous study, there has been increase in heart size with suggestion of venous reversal and early vascular congestion.  No active inflammatory is suggested.

[view not recorded (1 of 2)]
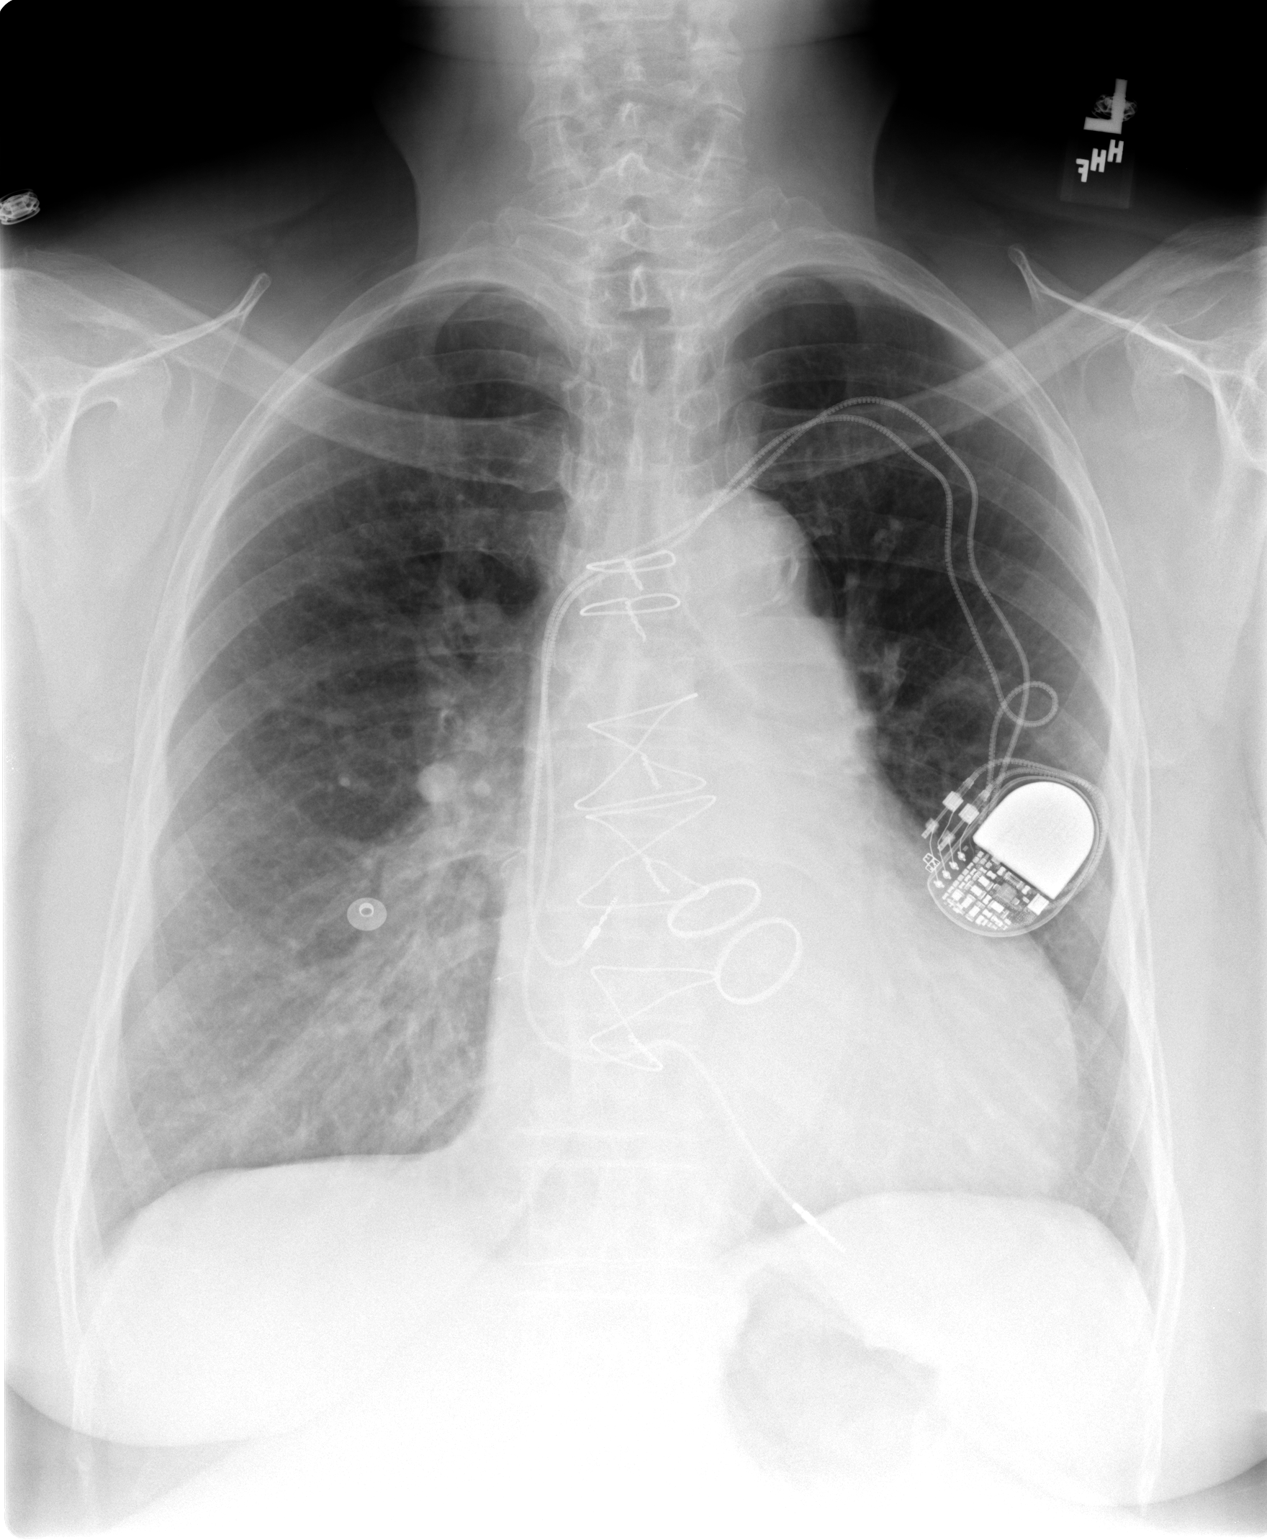

[view not recorded (2 of 2)]
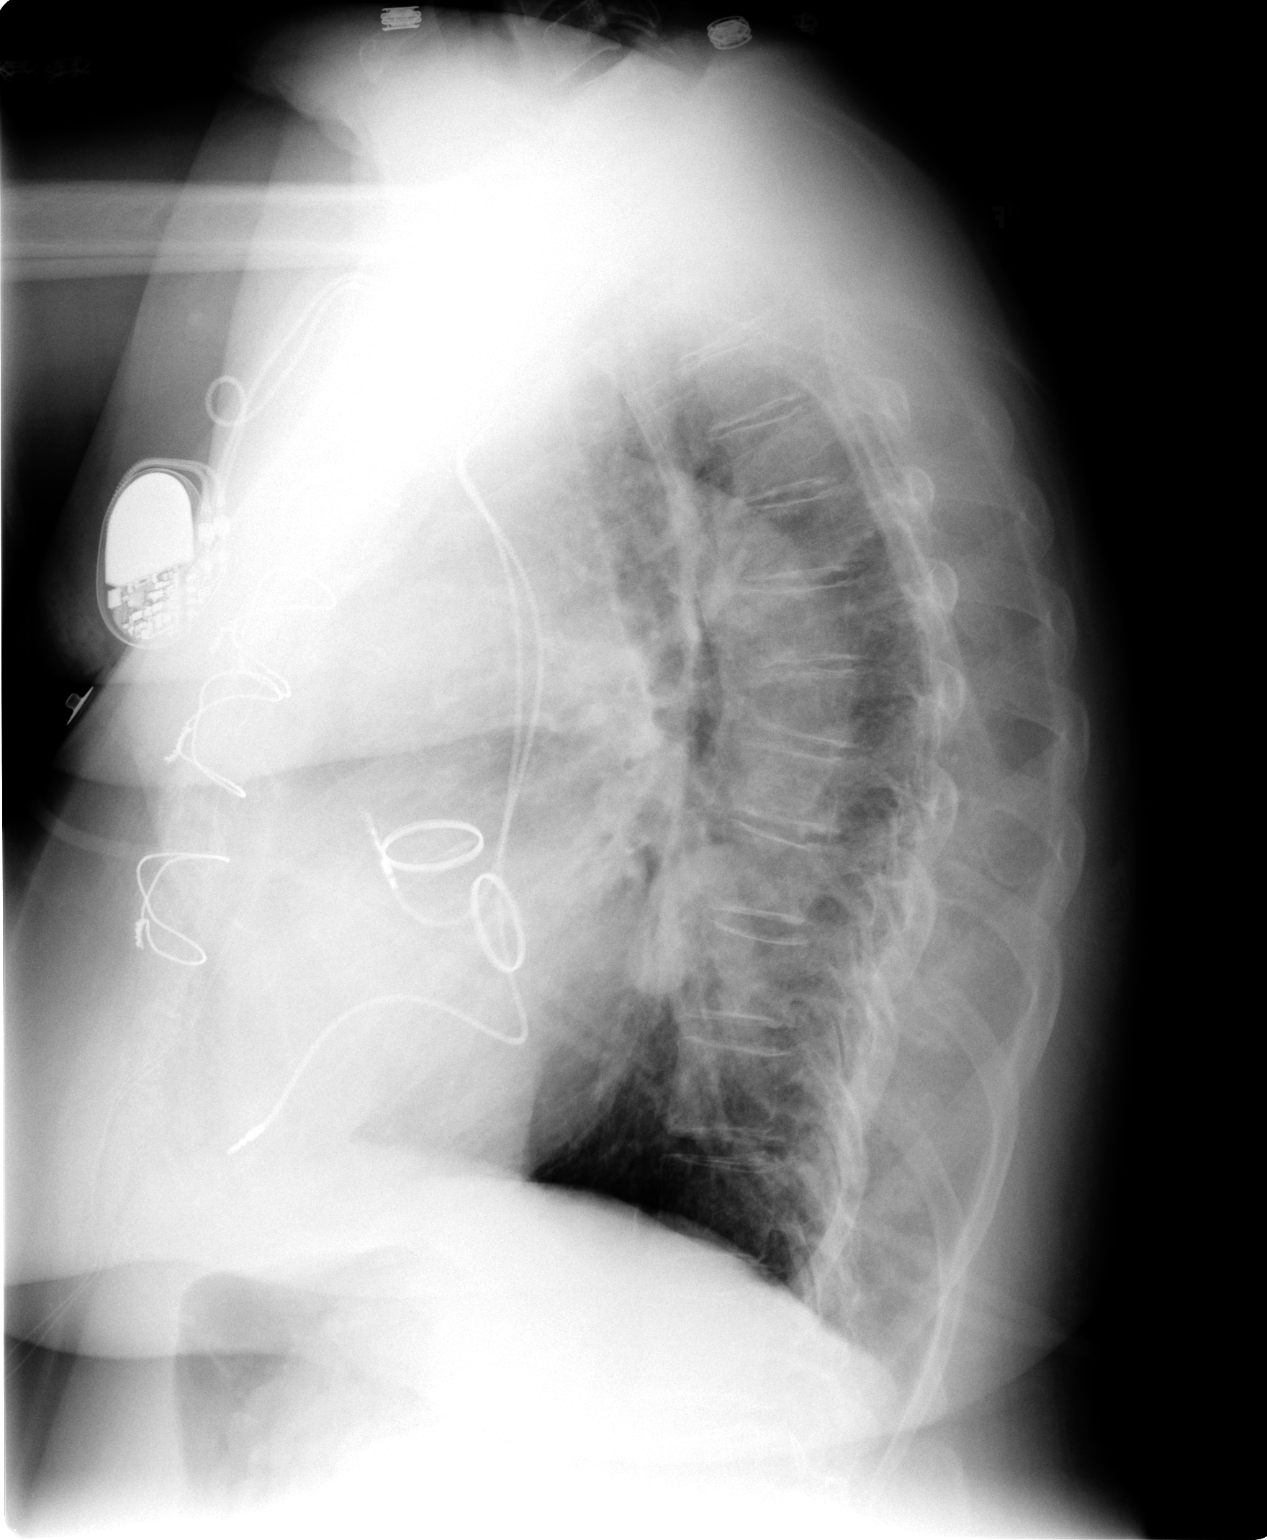

[2 of 2 positions shown; findings below may reference images not displayed]

IMPRESSION: Interval increase in heart size with findings suggesting vascular congestion, ,but without frank pulmonary edema and without active inflammatory change suggested.

## 2005-12-11 ENCOUNTER — Ambulatory Visit (HOSPITAL_COMMUNITY): Admission: RE | Admit: 2005-12-11 | Discharge: 2005-12-11 | Payer: Self-pay | Admitting: Family Medicine

## 2005-12-11 IMAGING — CR DG CHEST 2V
2 series · 2 of 2 positions shown · non-contrast
Comparison: Two-view chest x-ray 01/30/2005.

CLINICAL DATA: Prior aortic and mitral valve replacements. Over-anticoagulated
with high INR. Followup pulmonary edema. 

CHEST - 2 VIEW  02/02/2005:

[w chest pa]
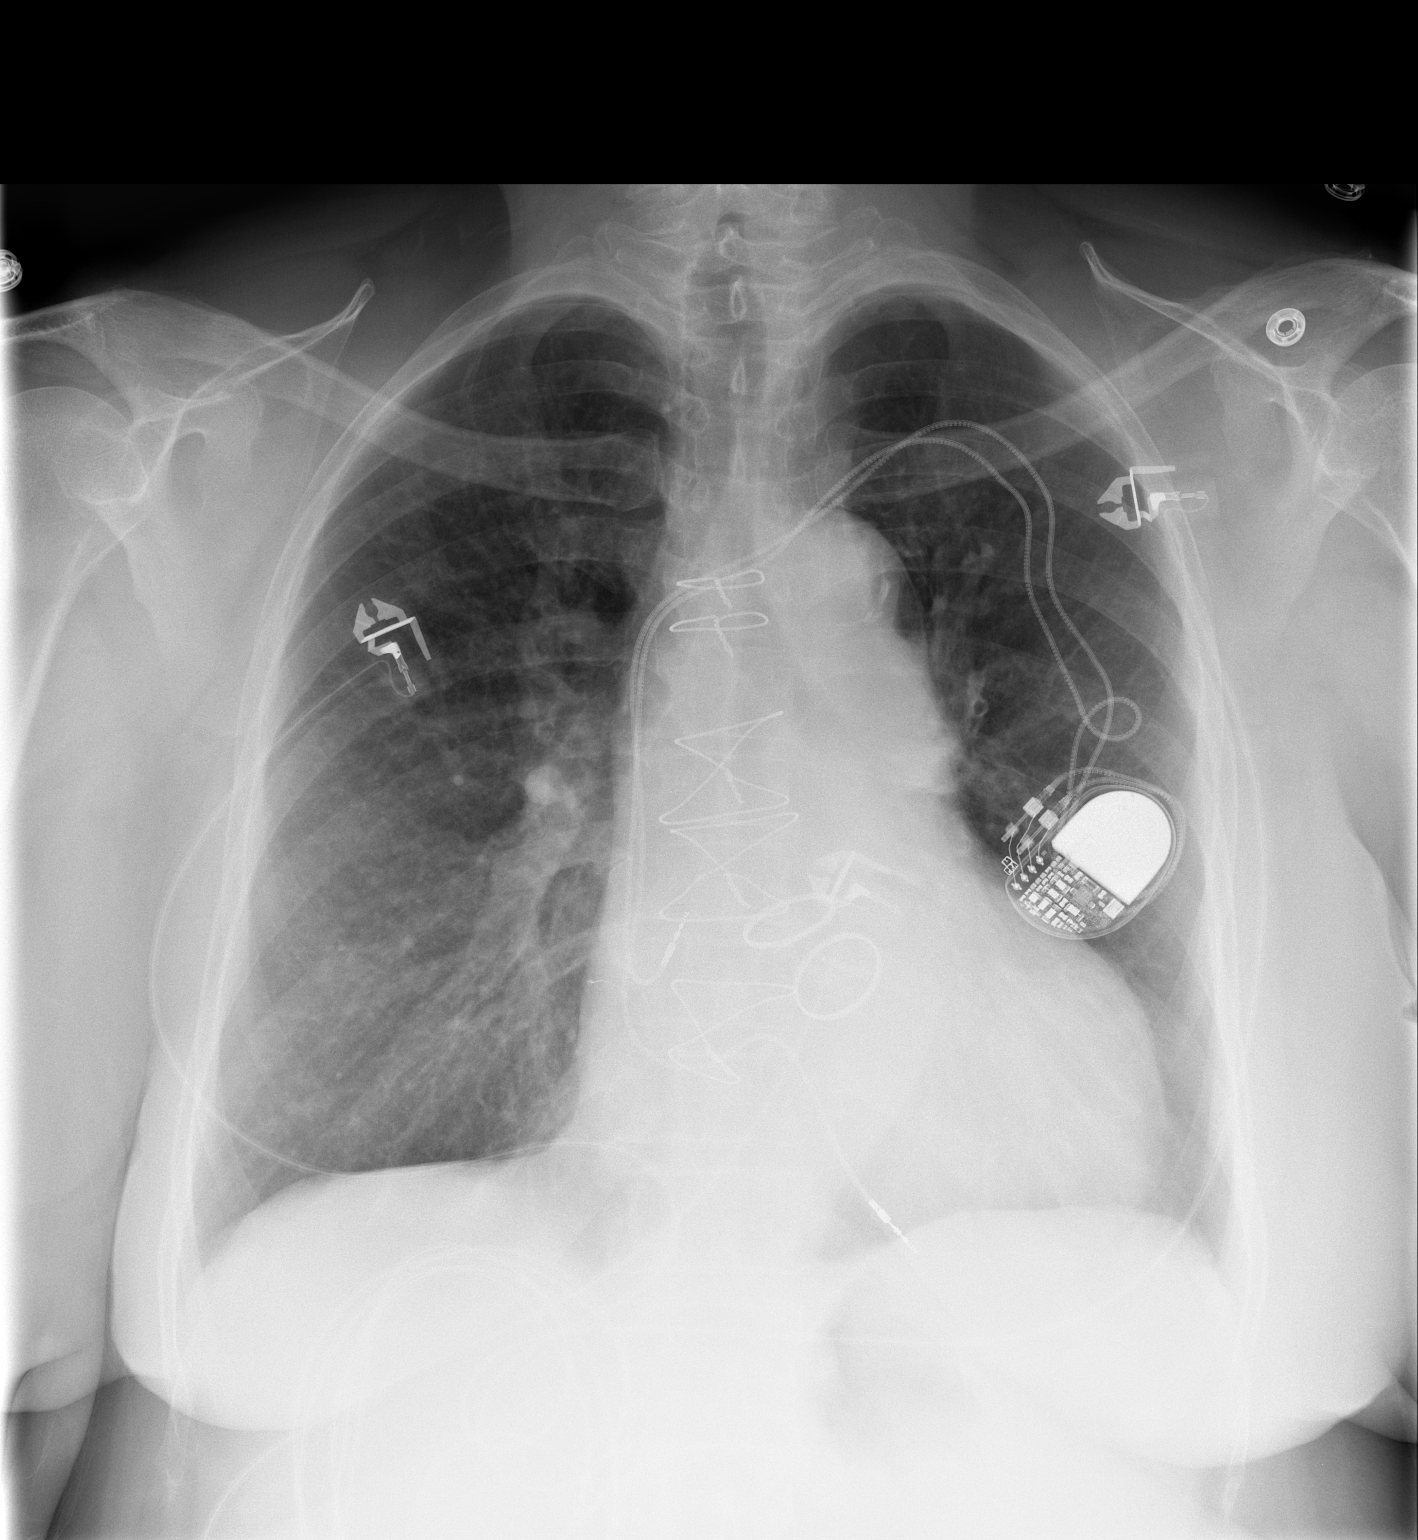

[w chest lat]
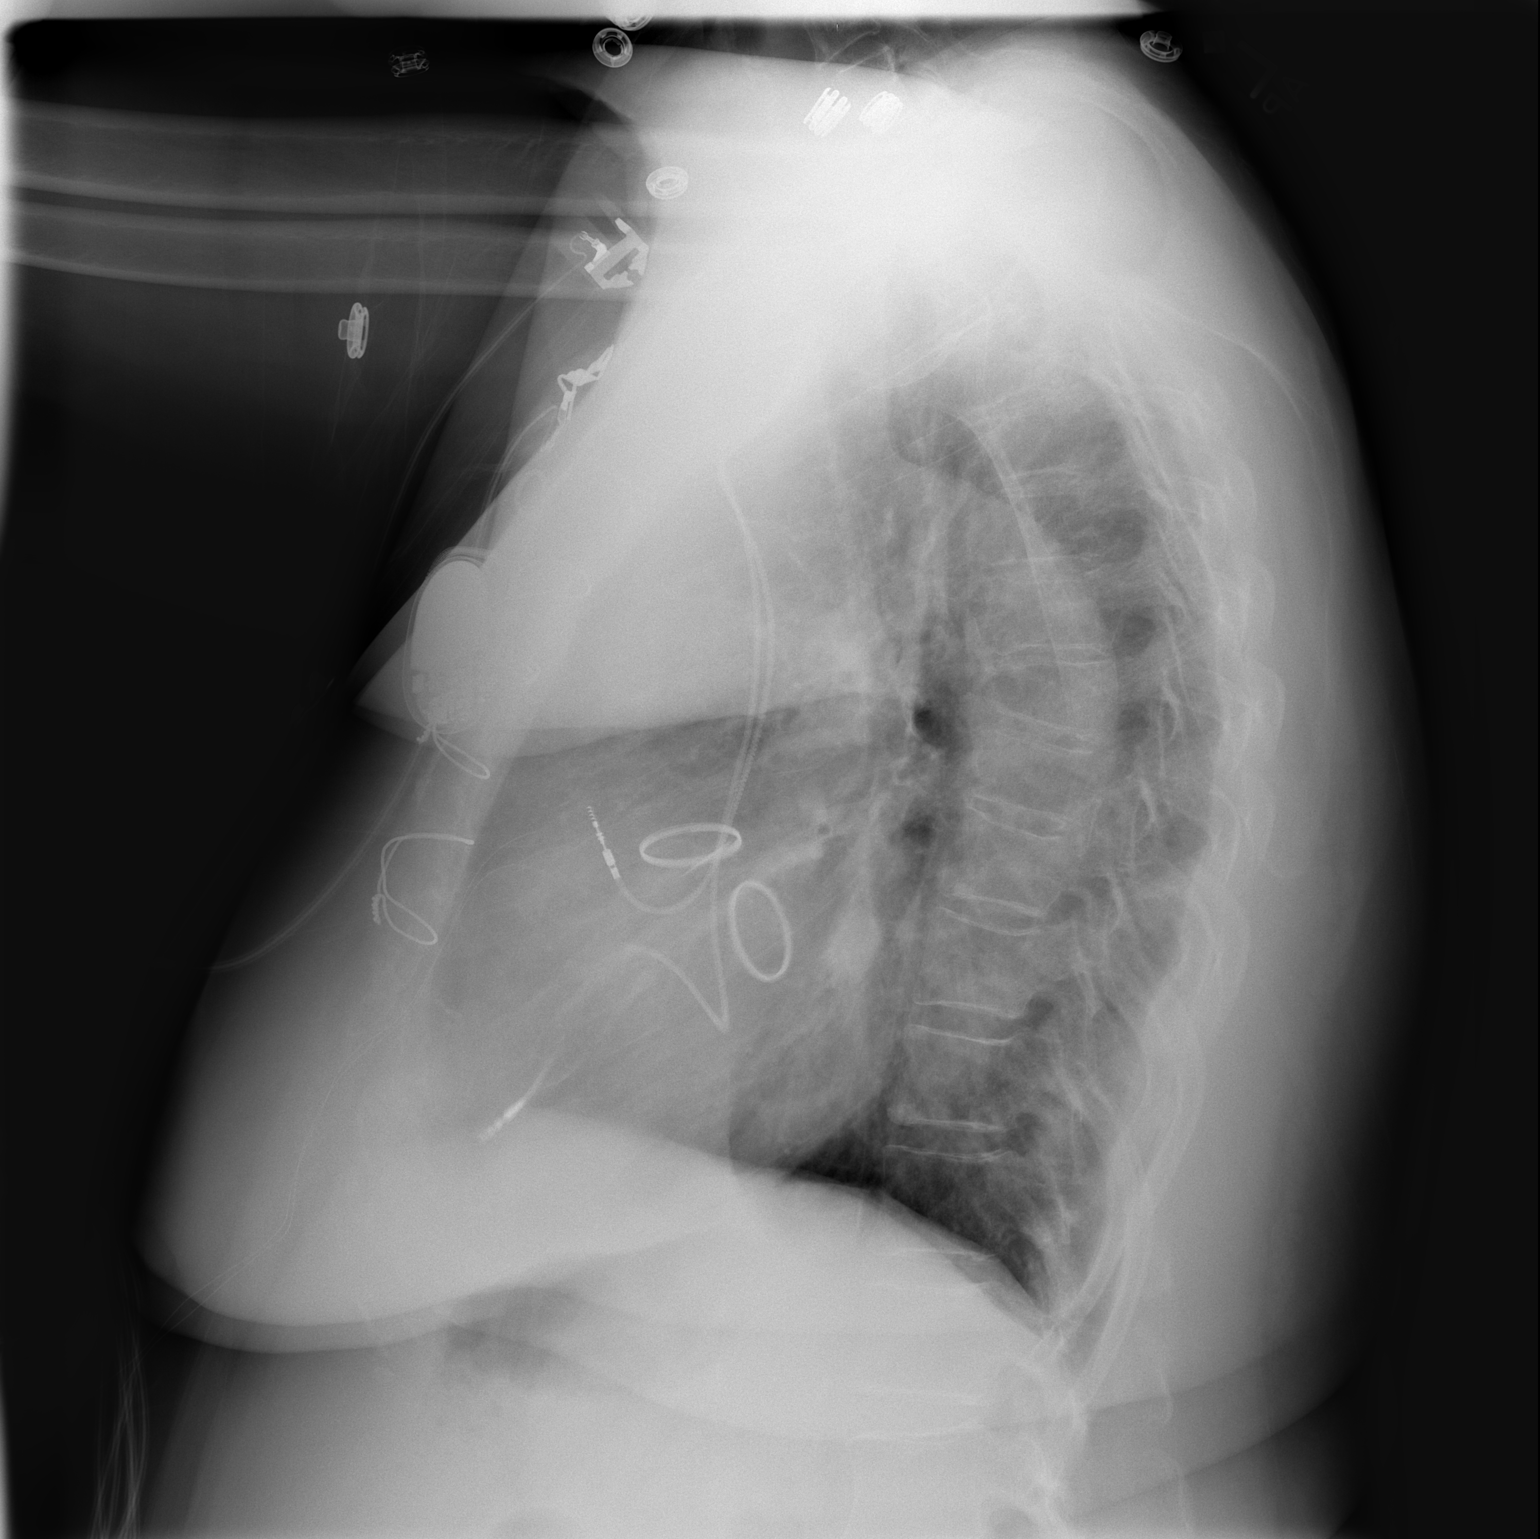

[2 of 2 positions shown; findings below may reference images not displayed]

FINDINGS: The mild interstitial edema present on the previous examination has
resolved. The heart is mildly enlarged but stable. The lungs are now clear.
There are no significant pleural effusions. The dual-lead transvenous pacer is
unchanged. The prosthetic aortic and mitral valves are noted.
IMPRESSION: Resolution of pulmonary edema. No evidence of acute disease at this time. Stable
mild cardiomegaly.

## 2005-12-19 ENCOUNTER — Inpatient Hospital Stay (HOSPITAL_COMMUNITY): Admission: AD | Admit: 2005-12-19 | Discharge: 2005-12-20 | Payer: Self-pay | Admitting: Internal Medicine

## 2005-12-23 IMAGING — CR DG CHEST 2V
2 series · 2 of 2 positions shown · non-contrast
Comparison: none

CLINICAL DATA: Bronchitis, cough.
 2 VIEW CHEST RADIOGRAPH ? 02/14/05: 
 Comparing:    02/02/05.

[view not recorded (1 of 2)]
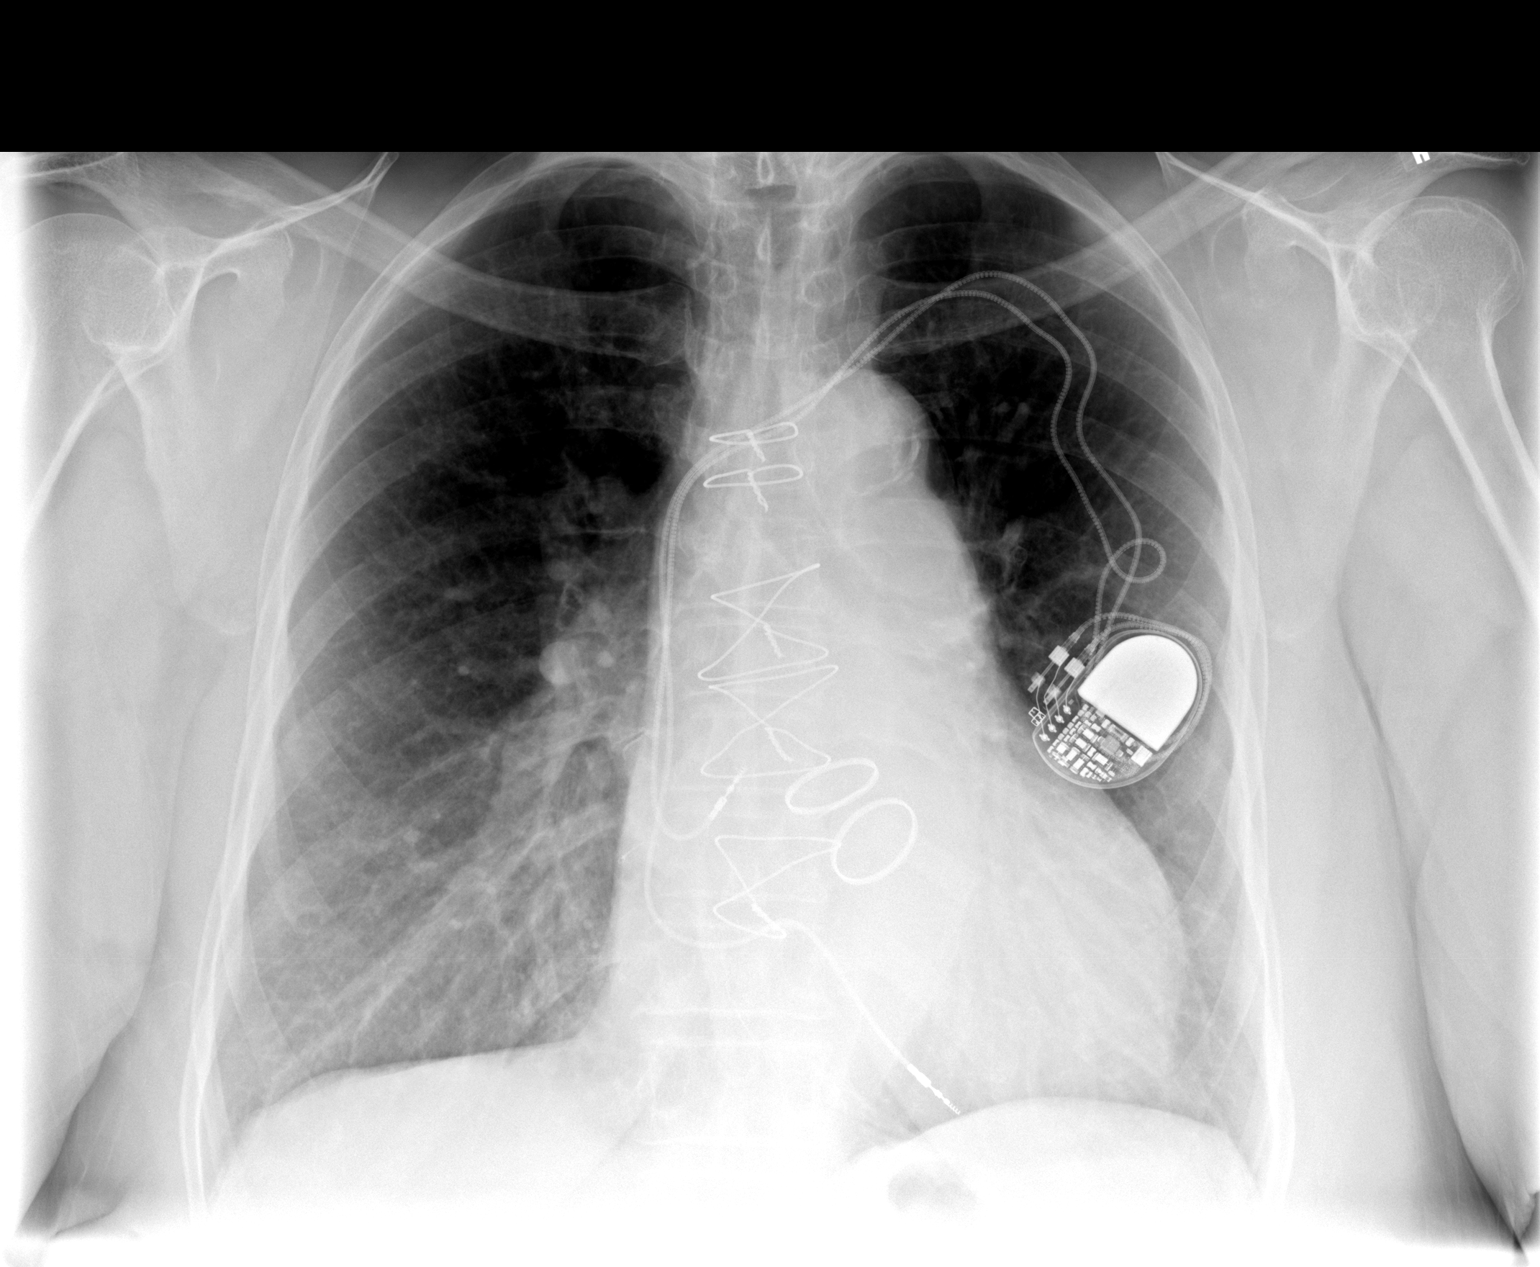

[view not recorded (2 of 2)]
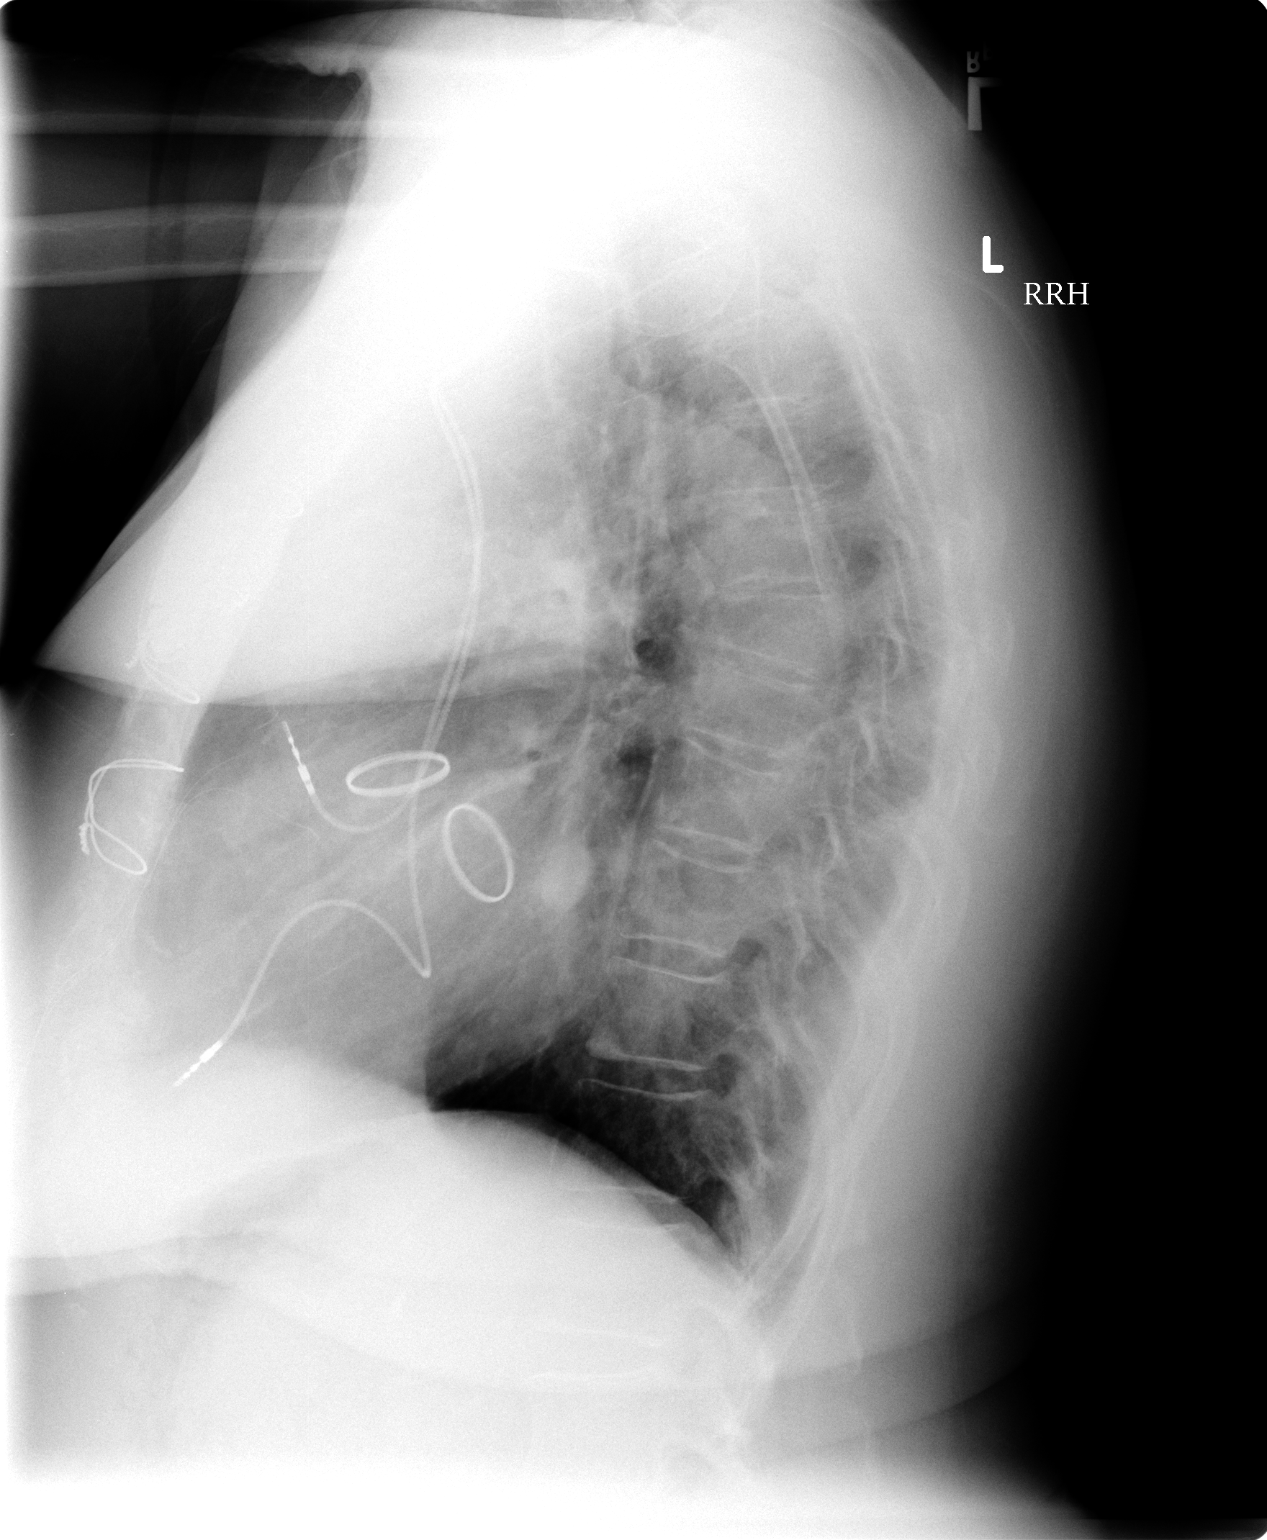

[2 of 2 positions shown; findings below may reference images not displayed]

FINDINGS: A dual lead pacer is noted.  Prosthetic aortic and mitral valves appear stable.  There is pulmonary venous hypertension without overt edema.  Mild anterior wedging of a mid thoracic vertebral body is likely stable.
IMPRESSION: 1.  Cardiomegaly and pulmonary venous hypertension.
 2.  Mild central airway thickening, chronic, and potentially reflecting chronic bronchitis.
 3.  Dual lead pacer is noted.

## 2006-01-10 ENCOUNTER — Encounter (INDEPENDENT_AMBULATORY_CARE_PROVIDER_SITE_OTHER): Payer: Self-pay | Admitting: *Deleted

## 2006-01-10 LAB — CONVERTED CEMR LAB: Pap Smear: NORMAL

## 2006-02-12 ENCOUNTER — Ambulatory Visit: Payer: Self-pay | Admitting: Family Medicine

## 2006-02-18 IMAGING — CR DG CHEST 2V
2 series · 2 of 2 positions shown · non-contrast
Comparison: none

CLINICAL DATA: CHF, short of breath. 
 CHEST ? 2 VIEW:
 Two views of the chest are compared to a chest x-ray of 02/14/05.   There is cardiomegaly present with mild pulmonary vascular congestion.   No effusion is seen.   Permanent pacemaker remains.

[w chest pa]
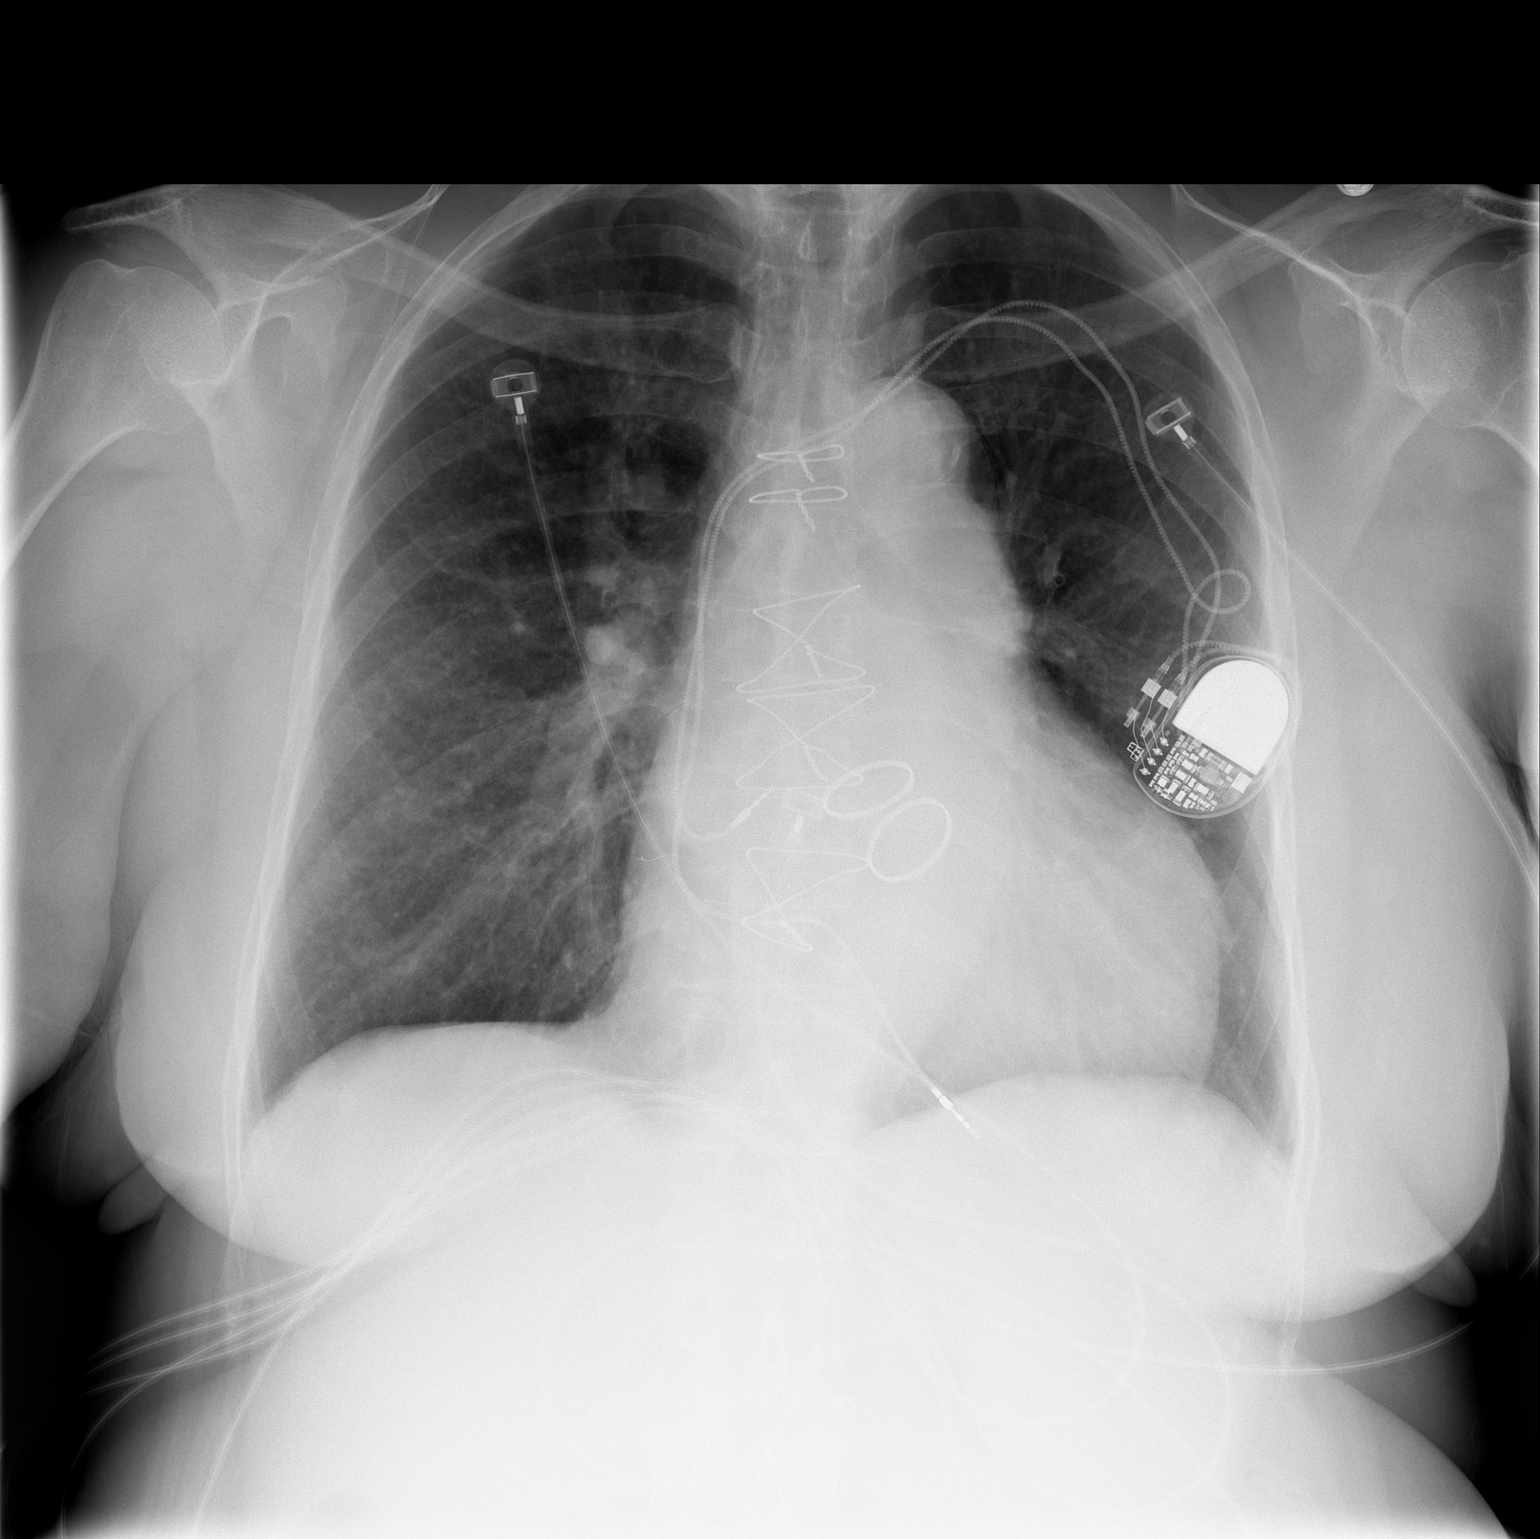

[w chest lat *]
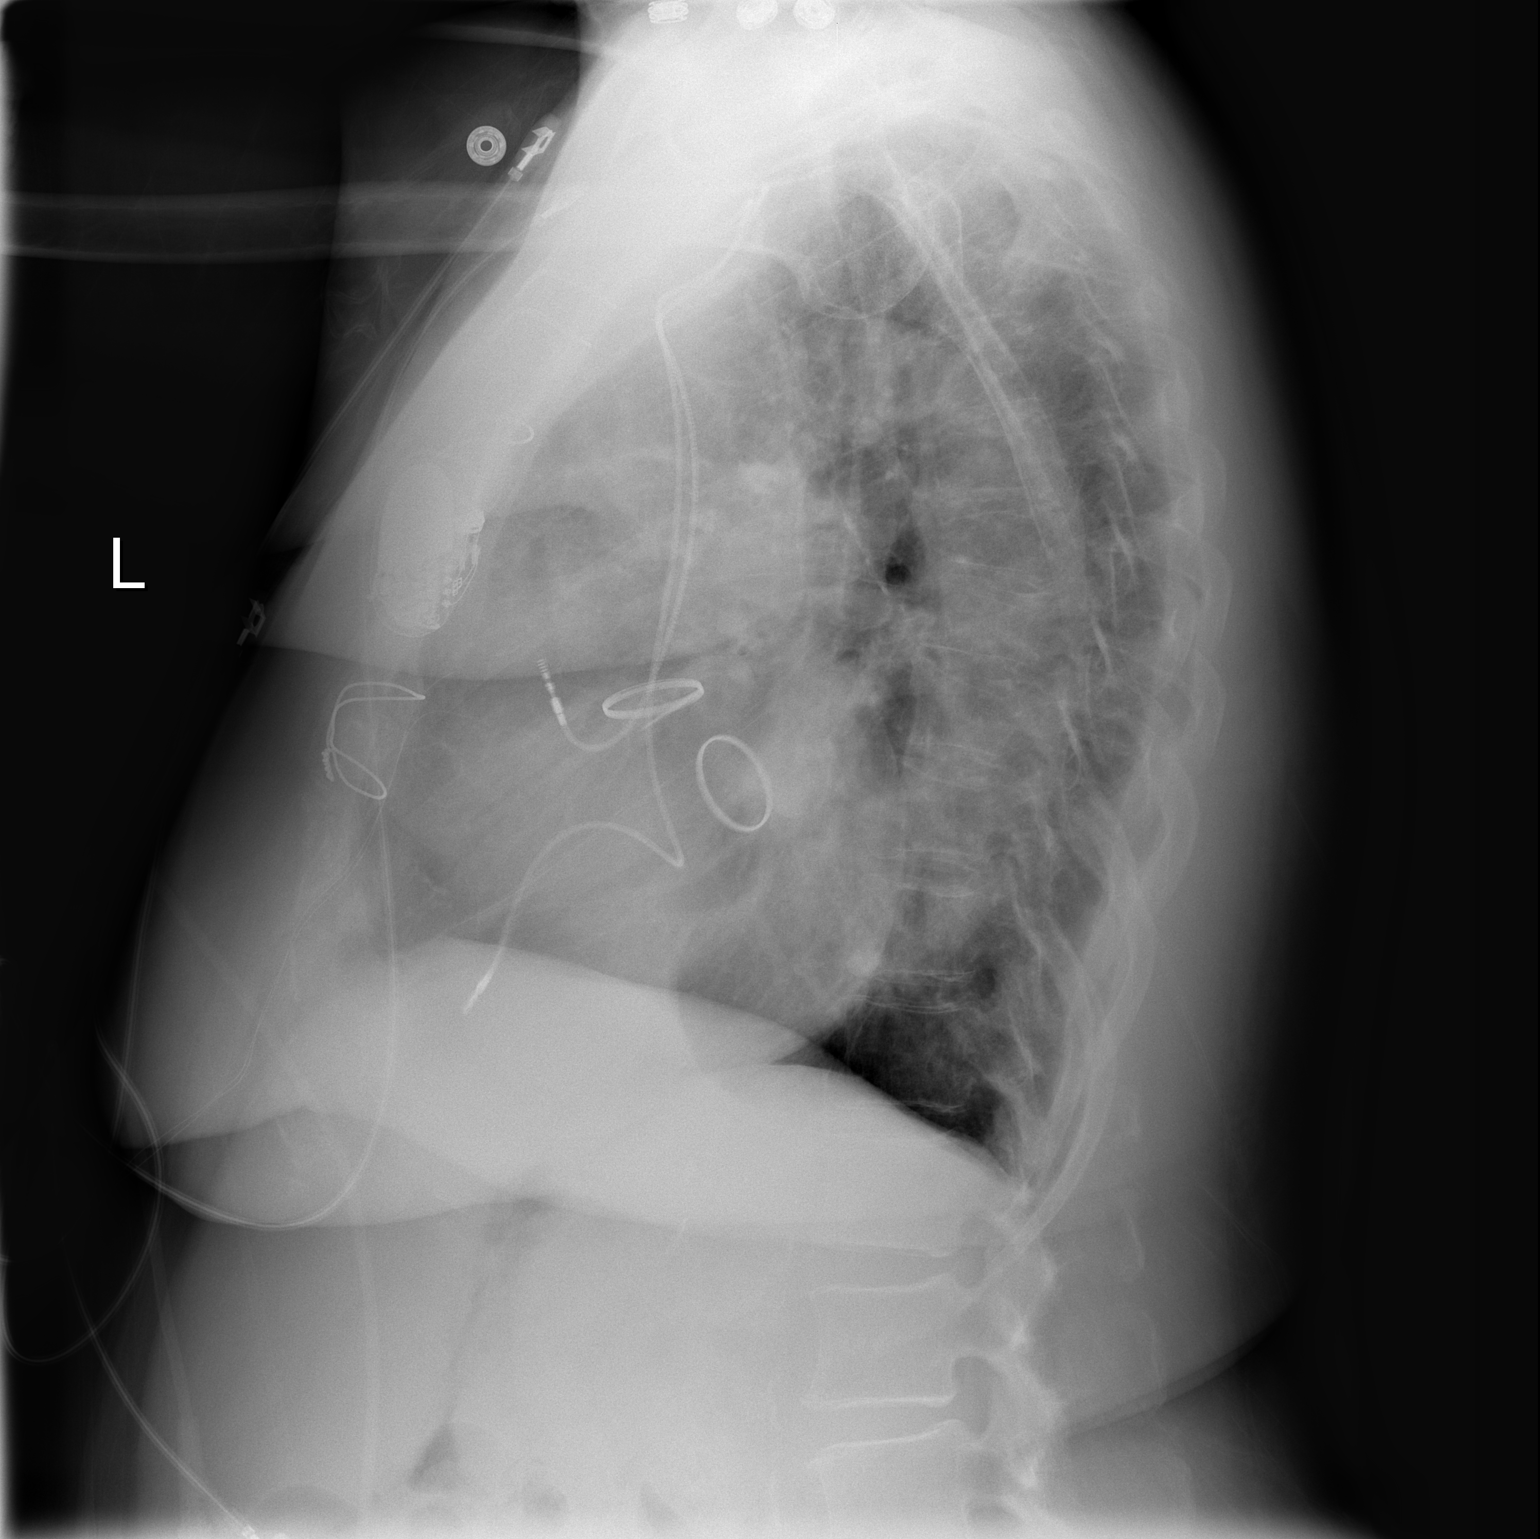

[2 of 2 positions shown; findings below may reference images not displayed]

IMPRESSION: Cardiomegaly with mild congestion.   Permanent pacer present.

## 2006-02-20 IMAGING — CR DG CHEST 2V
2 series · 2 of 2 positions shown · non-contrast
Comparison: 04/12/05.

CLINICAL DATA: ICD revision.  On medication for hypertension.  Nonsmoker.  No present chest complaints. 
 CHEST - 2 VIEWS:

[w chest pa]
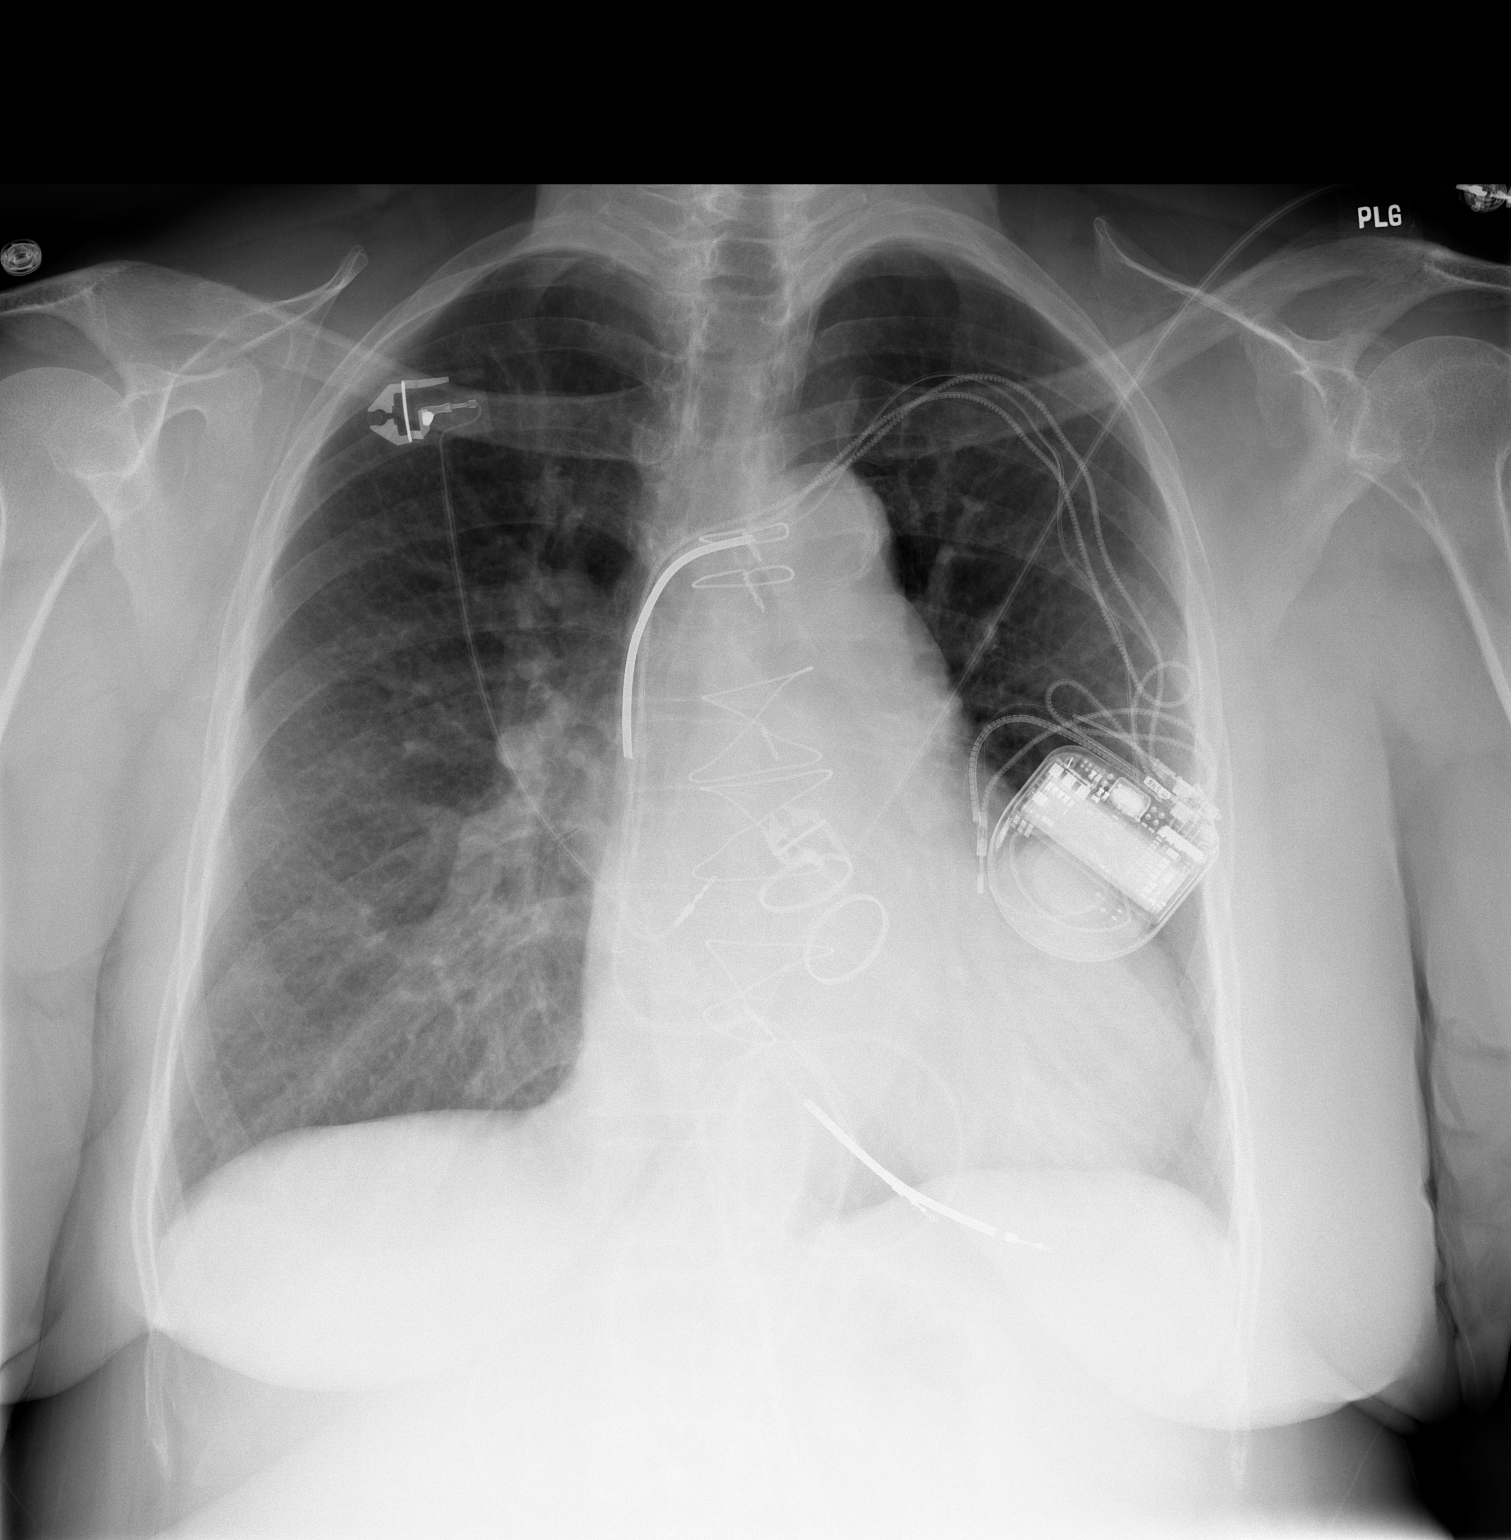

[w chest lat]
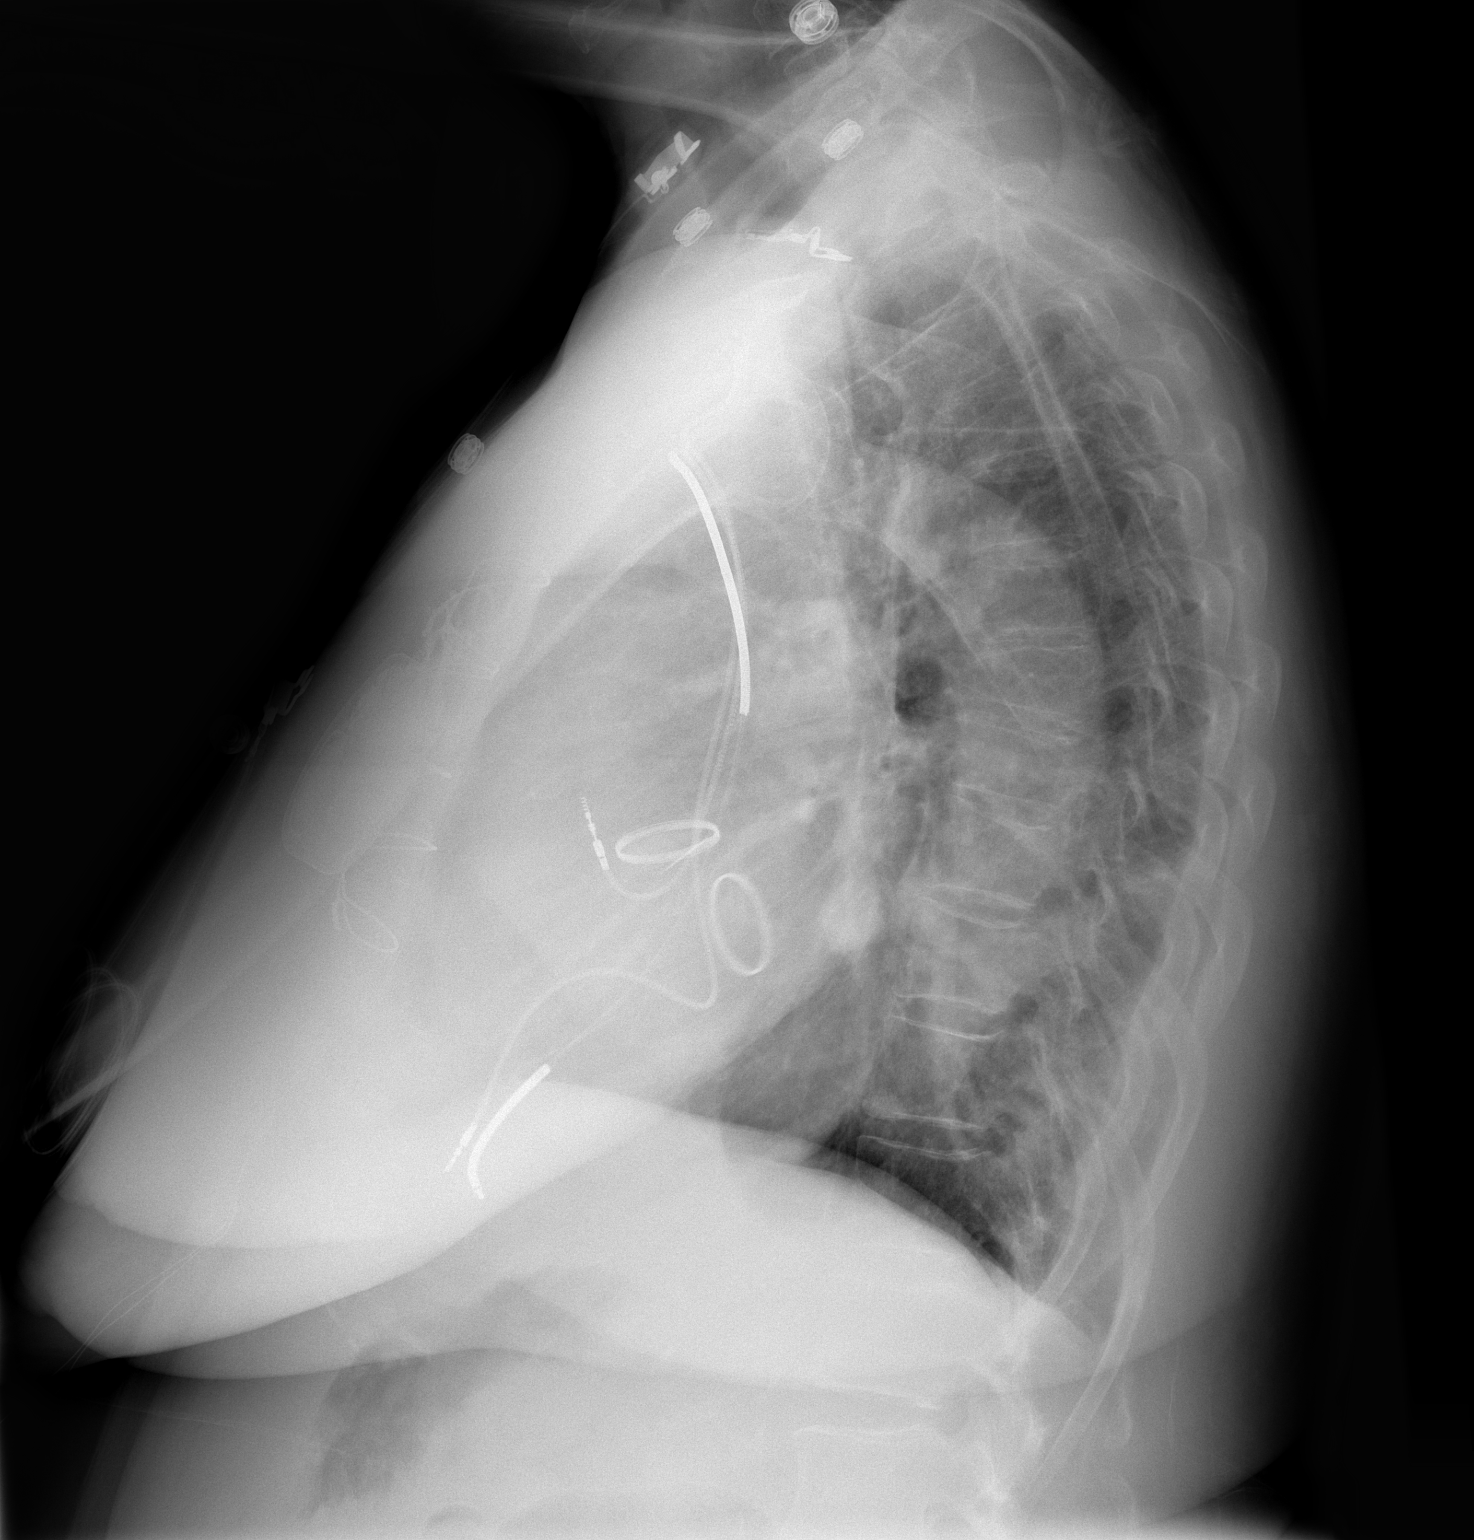

[2 of 2 positions shown; findings below may reference images not displayed]

PA and lateral views of the chest are made and are compared to the previous studies of 04/12/05 and show a new battery and ICD to have been placed in the left upper chest.  The new electrode lies in the aorta region of the ascending arch and in the right ventricle.  There is again noted cardiomegaly and previous double heart valve replacement.  There is no evidence of pneumothorax or pleural effusion.  The lungs show diffuse peribronchial thickening but no definite edema.  The bones show osteopenia and stable anterior narrowing of one of the mid thoracic vertebral bodies.
IMPRESSION: New ICD lead and battery pack in good position.  No evidence of pneumothorax.  Moderate cardiomegaly.  No definite edema or acute infiltrate.

## 2006-03-27 ENCOUNTER — Ambulatory Visit (HOSPITAL_COMMUNITY): Admission: RE | Admit: 2006-03-27 | Discharge: 2006-03-27 | Payer: Self-pay | Admitting: Family Medicine

## 2006-03-27 ENCOUNTER — Ambulatory Visit: Payer: Self-pay | Admitting: Family Medicine

## 2006-05-22 ENCOUNTER — Ambulatory Visit: Payer: Self-pay | Admitting: Family Medicine

## 2006-06-27 ENCOUNTER — Ambulatory Visit: Payer: Self-pay | Admitting: Family Medicine

## 2006-07-02 ENCOUNTER — Ambulatory Visit: Payer: Self-pay | Admitting: Family Medicine

## 2006-08-20 ENCOUNTER — Ambulatory Visit: Payer: Self-pay | Admitting: Family Medicine

## 2006-10-22 ENCOUNTER — Ambulatory Visit: Payer: Self-pay | Admitting: Family Medicine

## 2006-10-22 ENCOUNTER — Encounter (INDEPENDENT_AMBULATORY_CARE_PROVIDER_SITE_OTHER): Payer: Self-pay | Admitting: *Deleted

## 2006-10-22 LAB — CONVERTED CEMR LAB: Pap Smear: NORMAL

## 2006-10-23 ENCOUNTER — Encounter: Payer: Self-pay | Admitting: Family Medicine

## 2006-10-23 LAB — CONVERTED CEMR LAB: Microalb, Ur: 10.4 mg/dL — ABNORMAL HIGH (ref 0.00–1.89)

## 2006-11-26 ENCOUNTER — Encounter: Payer: Self-pay | Admitting: Family Medicine

## 2006-11-26 LAB — CONVERTED CEMR LAB
Hgb A1c MFr Bld: 6.5 % — ABNORMAL HIGH (ref 4.6–6.1)
TSH: 1.545 microintl units/mL (ref 0.350–5.50)

## 2006-12-04 ENCOUNTER — Ambulatory Visit: Payer: Self-pay | Admitting: Family Medicine

## 2007-01-14 ENCOUNTER — Ambulatory Visit (HOSPITAL_COMMUNITY): Admission: RE | Admit: 2007-01-14 | Discharge: 2007-01-14 | Payer: Self-pay | Admitting: Family Medicine

## 2007-01-29 ENCOUNTER — Ambulatory Visit: Payer: Self-pay | Admitting: Family Medicine

## 2007-02-01 ENCOUNTER — Ambulatory Visit (HOSPITAL_COMMUNITY): Admission: RE | Admit: 2007-02-01 | Discharge: 2007-02-01 | Payer: Self-pay | Admitting: Family Medicine

## 2007-03-12 ENCOUNTER — Encounter: Payer: Self-pay | Admitting: Family Medicine

## 2007-03-12 LAB — CONVERTED CEMR LAB
Cholesterol: 100 mg/dL (ref 0–200)
HDL: 28 mg/dL — ABNORMAL LOW (ref >39)
Hgb A1c MFr Bld: 6.5 % — ABNORMAL HIGH (ref 4.6–6.1)
LDL Cholesterol: 43 mg/dL (ref 0–99)
TSH: 1.886 microintl units/mL (ref 0.350–5.50)
Total CHOL/HDL Ratio: 3.6
Triglycerides: 143 mg/dL (ref <150)
VLDL: 29 mg/dL (ref 0–40)

## 2007-03-18 ENCOUNTER — Ambulatory Visit: Payer: Self-pay | Admitting: Family Medicine

## 2007-03-18 LAB — CONVERTED CEMR LAB
BUN: 24 mg/dL — ABNORMAL HIGH (ref 6–23)
CO2: 24 meq/L (ref 19–32)
Calcium: 9.1 mg/dL (ref 8.4–10.5)
Chloride: 103 meq/L (ref 96–112)
Creatinine, Ser: 1.34 mg/dL — ABNORMAL HIGH (ref 0.40–1.20)
Ferritin: 315 ng/mL — ABNORMAL HIGH (ref 10–291)
Folate: >20 ng/mL
Glucose, Bld: 82 mg/dL (ref 70–99)
Iron: 39 ug/dL — ABNORMAL LOW (ref 42–145)
Potassium: 4.3 meq/L (ref 3.5–5.3)
Saturation Ratios: 13 % — ABNORMAL LOW (ref 20–55)
Sodium: 140 meq/L (ref 135–145)
TIBC: 289 ug/dL (ref 250–470)
UIBC: 250 ug/dL
Vitamin B-12: 379 pg/mL (ref 211–911)

## 2007-03-19 ENCOUNTER — Encounter: Payer: Self-pay | Admitting: Family Medicine

## 2007-03-19 LAB — CONVERTED CEMR LAB
Basophils Absolute: 0 10*3/uL (ref 0.0–0.1)
Basophils Relative: 0 % (ref 0–1)
Eosinophils Absolute: 0 10*3/uL (ref 0.0–0.7)
Eosinophils Relative: 0 % (ref 0–5)
HCT: 34 % — ABNORMAL LOW (ref 36.0–46.0)
Hemoglobin: 10.3 g/dL — ABNORMAL LOW (ref 12.0–15.0)
Lymphocytes Relative: 19 % (ref 12–46)
Lymphs Abs: 1.6 10*3/uL (ref 0.7–3.3)
MCHC: 30.3 g/dL (ref 30.0–36.0)
MCV: 86.7 fL (ref 78.0–100.0)
Monocytes Absolute: 0.6 10*3/uL (ref 0.2–0.7)
Monocytes Relative: 8 % (ref 3–11)
Neutro Abs: 6.1 10*3/uL (ref 1.7–7.7)
Neutrophils Relative %: 73 % (ref 43–77)
Platelets: 268 10*3/uL (ref 150–400)
RBC: 3.92 M/uL (ref 3.87–5.11)
RDW: 16.4 % — ABNORMAL HIGH (ref 11.5–14.0)
Retic Count, Absolute: 62.7 (ref 19.0–186.0)
Retic Ct Pct: 1.6 % (ref 0.4–3.1)
WBC: 8.3 10*3/uL (ref 4.0–10.5)

## 2007-04-29 ENCOUNTER — Ambulatory Visit: Payer: Self-pay | Admitting: Family Medicine

## 2007-05-13 ENCOUNTER — Ambulatory Visit: Payer: Self-pay | Admitting: Internal Medicine

## 2007-05-30 ENCOUNTER — Ambulatory Visit: Payer: Self-pay | Admitting: Gastroenterology

## 2007-06-10 ENCOUNTER — Ambulatory Visit: Payer: Self-pay | Admitting: Internal Medicine

## 2007-06-10 ENCOUNTER — Inpatient Hospital Stay (HOSPITAL_COMMUNITY): Admission: AD | Admit: 2007-06-10 | Discharge: 2007-06-16 | Payer: Self-pay | Admitting: Internal Medicine

## 2007-06-11 ENCOUNTER — Ambulatory Visit: Payer: Self-pay | Admitting: Internal Medicine

## 2007-06-12 ENCOUNTER — Ambulatory Visit: Payer: Self-pay | Admitting: Internal Medicine

## 2007-06-12 HISTORY — PX: ESOPHAGOGASTRODUODENOSCOPY: SHX1529

## 2007-06-12 HISTORY — PX: COLONOSCOPY: SHX174

## 2007-06-15 ENCOUNTER — Ambulatory Visit: Payer: Self-pay | Admitting: Internal Medicine

## 2007-06-20 ENCOUNTER — Ambulatory Visit: Payer: Self-pay | Admitting: Family Medicine

## 2007-06-20 LAB — CONVERTED CEMR LAB
BUN: 24 mg/dL — ABNORMAL HIGH (ref 6–23)
Basophils Absolute: 0 10*3/uL (ref 0.0–0.1)
Basophils Relative: 0 % (ref 0–1)
CO2: 28 meq/L (ref 19–32)
Calcium: 9.8 mg/dL (ref 8.4–10.5)
Chloride: 101 meq/L (ref 96–112)
Creatinine, Ser: 1.22 mg/dL — ABNORMAL HIGH (ref 0.40–1.20)
Eosinophils Absolute: 0 10*3/uL (ref 0.0–0.7)
Eosinophils Relative: 0 % (ref 0–5)
Ferritin: 376 ng/mL — ABNORMAL HIGH (ref 10–291)
Folate: >20 ng/mL
Glucose, Bld: 83 mg/dL (ref 70–99)
HCT: 33.9 % — ABNORMAL LOW (ref 36.0–46.0)
Hemoglobin: 10.7 g/dL — ABNORMAL LOW (ref 12.0–15.0)
Iron: 47 ug/dL (ref 42–145)
Lymphocytes Relative: 13 % (ref 12–46)
Lymphs Abs: 1 10*3/uL (ref 0.7–3.3)
MCHC: 31.6 g/dL (ref 30.0–36.0)
MCV: 85.2 fL (ref 78.0–100.0)
Monocytes Absolute: 0.6 10*3/uL (ref 0.2–0.7)
Monocytes Relative: 7 % (ref 3–11)
Neutro Abs: 6.3 10*3/uL (ref 1.7–7.7)
Neutrophils Relative %: 80 % — ABNORMAL HIGH (ref 43–77)
Platelets: 248 10*3/uL (ref 150–400)
Potassium: 4.8 meq/L (ref 3.5–5.3)
RBC: 3.98 M/uL (ref 3.87–5.11)
RDW: 17.2 % — ABNORMAL HIGH (ref 11.5–14.0)
Retic Count, Absolute: 55.7 (ref 19.0–186.0)
Retic Ct Pct: 1.4 % (ref 0.4–3.1)
Saturation Ratios: 15 % — ABNORMAL LOW (ref 20–55)
Sodium: 140 meq/L (ref 135–145)
TIBC: 319 ug/dL (ref 250–470)
UIBC: 272 ug/dL
Vitamin B-12: 574 pg/mL (ref 211–911)
WBC: 7.9 10*3/uL (ref 4.0–10.5)

## 2007-06-24 ENCOUNTER — Ambulatory Visit (HOSPITAL_COMMUNITY): Admission: RE | Admit: 2007-06-24 | Discharge: 2007-06-24 | Payer: Self-pay | Admitting: Family Medicine

## 2007-07-24 ENCOUNTER — Encounter (HOSPITAL_COMMUNITY): Admission: RE | Admit: 2007-07-24 | Discharge: 2007-08-23 | Payer: Self-pay | Admitting: Oncology

## 2007-07-24 ENCOUNTER — Ambulatory Visit (HOSPITAL_COMMUNITY): Payer: Self-pay | Admitting: Oncology

## 2007-08-13 ENCOUNTER — Ambulatory Visit: Payer: Self-pay | Admitting: Family Medicine

## 2007-08-14 ENCOUNTER — Ambulatory Visit (HOSPITAL_COMMUNITY): Admission: RE | Admit: 2007-08-14 | Discharge: 2007-08-14 | Payer: Self-pay | Admitting: Family Medicine

## 2007-09-10 ENCOUNTER — Encounter: Payer: Self-pay | Admitting: Family Medicine

## 2007-09-10 LAB — CONVERTED CEMR LAB
Basophils Absolute: 0 10*3/uL (ref 0.0–0.1)
Basophils Relative: 0 % (ref 0–1)
Eosinophils Absolute: 0 10*3/uL — ABNORMAL LOW (ref 0.2–0.7)
Eosinophils Relative: 0 % (ref 0–5)
HCT: 34.4 % — ABNORMAL LOW (ref 36.0–46.0)
Hemoglobin: 10.6 g/dL — ABNORMAL LOW (ref 12.0–15.0)
Lymphocytes Relative: 23 % (ref 12–46)
Lymphs Abs: 1.3 10*3/uL (ref 0.7–4.0)
MCHC: 30.8 g/dL (ref 30.0–36.0)
MCV: 88.4 fL (ref 78.0–100.0)
Monocytes Absolute: 0.4 10*3/uL (ref 0.1–1.0)
Monocytes Relative: 7 % (ref 3–12)
Neutro Abs: 4.1 10*3/uL (ref 1.7–7.7)
Neutrophils Relative %: 70 % (ref 43–77)
Platelets: 234 10*3/uL (ref 150–400)
RBC: 3.89 M/uL (ref 3.87–5.11)
RDW: 16.8 % — ABNORMAL HIGH (ref 11.5–15.5)
Retic Ct Pct: 1.7 % (ref 0.4–3.1)
WBC: 5.8 10*3/uL (ref 4.0–10.5)

## 2007-09-23 ENCOUNTER — Ambulatory Visit: Payer: Self-pay | Admitting: Family Medicine

## 2007-09-30 ENCOUNTER — Encounter: Payer: Self-pay | Admitting: Family Medicine

## 2007-09-30 LAB — CONVERTED CEMR LAB
BUN: 24 mg/dL — ABNORMAL HIGH (ref 6–23)
Creatinine, Ser: 1.2 mg/dL (ref 0.40–1.20)

## 2007-11-06 ENCOUNTER — Encounter: Payer: Self-pay | Admitting: Family Medicine

## 2007-11-06 ENCOUNTER — Encounter (HOSPITAL_COMMUNITY): Admission: RE | Admit: 2007-11-06 | Discharge: 2007-12-06 | Payer: Self-pay | Admitting: Oncology

## 2007-11-06 ENCOUNTER — Ambulatory Visit (HOSPITAL_COMMUNITY): Payer: Self-pay | Admitting: Oncology

## 2007-11-06 LAB — CONVERTED CEMR LAB: Microalb, Ur: 2.5 mg/dL — ABNORMAL HIGH (ref 0.00–1.89)

## 2007-11-21 ENCOUNTER — Ambulatory Visit: Payer: Self-pay | Admitting: Family Medicine

## 2007-12-02 ENCOUNTER — Ambulatory Visit: Payer: Self-pay | Admitting: Family Medicine

## 2007-12-24 ENCOUNTER — Ambulatory Visit: Payer: Self-pay | Admitting: Family Medicine

## 2007-12-26 ENCOUNTER — Encounter: Payer: Self-pay | Admitting: Family Medicine

## 2007-12-26 LAB — CONVERTED CEMR LAB
HCT: 32.3 % — ABNORMAL LOW (ref 36.0–46.0)
Hemoglobin: 11 g/dL — ABNORMAL LOW (ref 12.0–15.0)
INR: 5.4 (ref 0.0–1.5)
Prothrombin Time: 52.7 s — ABNORMAL HIGH (ref 11.6–15.2)

## 2007-12-27 ENCOUNTER — Ambulatory Visit: Payer: Self-pay | Admitting: Family Medicine

## 2007-12-29 ENCOUNTER — Encounter: Payer: Self-pay | Admitting: Family Medicine

## 2007-12-29 LAB — CONVERTED CEMR LAB
INR: 1.4 (ref 0.0–1.5)
Prothrombin Time: 18.1 s — ABNORMAL HIGH (ref 11.6–15.2)

## 2007-12-31 ENCOUNTER — Ambulatory Visit: Payer: Self-pay | Admitting: Family Medicine

## 2008-01-02 ENCOUNTER — Encounter: Payer: Self-pay | Admitting: Family Medicine

## 2008-01-02 LAB — CONVERTED CEMR LAB
INR: 1.9 — ABNORMAL HIGH (ref 0.0–1.5)
Prothrombin Time: 22.2 s — ABNORMAL HIGH (ref 11.6–15.2)

## 2008-01-14 ENCOUNTER — Other Ambulatory Visit: Admission: RE | Admit: 2008-01-14 | Discharge: 2008-01-14 | Payer: Self-pay | Admitting: Obstetrics and Gynecology

## 2008-01-16 ENCOUNTER — Ambulatory Visit (HOSPITAL_COMMUNITY): Admission: RE | Admit: 2008-01-16 | Discharge: 2008-01-16 | Payer: Self-pay | Admitting: Family Medicine

## 2008-01-27 ENCOUNTER — Ambulatory Visit (HOSPITAL_COMMUNITY): Admission: RE | Admit: 2008-01-27 | Discharge: 2008-01-27 | Payer: Self-pay | Admitting: Family Medicine

## 2008-01-27 ENCOUNTER — Encounter (HOSPITAL_COMMUNITY): Admission: RE | Admit: 2008-01-27 | Discharge: 2008-02-26 | Payer: Self-pay | Admitting: Oncology

## 2008-02-19 ENCOUNTER — Encounter: Payer: Self-pay | Admitting: Family Medicine

## 2008-02-19 LAB — CONVERTED CEMR LAB
ALT: 12 units/L (ref 0–35)
AST: 25 units/L (ref 0–37)
Albumin: 4.2 g/dL (ref 3.5–5.2)
Alkaline Phosphatase: 50 units/L (ref 39–117)
BUN: 20 mg/dL (ref 6–23)
Bilirubin, Direct: <0.1 mg/dL (ref 0.0–0.3)
CO2: 26 meq/L (ref 19–32)
Calcium: 9.9 mg/dL (ref 8.4–10.5)
Chloride: 101 meq/L (ref 96–112)
Cholesterol: 108 mg/dL (ref 0–200)
Creatinine, Ser: 1.38 mg/dL — ABNORMAL HIGH (ref 0.40–1.20)
Glucose, Bld: 118 mg/dL — ABNORMAL HIGH (ref 70–99)
HDL: 30 mg/dL — ABNORMAL LOW (ref >39)
LDL Cholesterol: 42 mg/dL (ref 0–99)
Potassium: 4.5 meq/L (ref 3.5–5.3)
Sodium: 139 meq/L (ref 135–145)
Total Bilirubin: 0.3 mg/dL (ref 0.3–1.2)
Total CHOL/HDL Ratio: 3.6
Total Protein: 7.9 g/dL (ref 6.0–8.3)
Triglycerides: 179 mg/dL — ABNORMAL HIGH (ref <150)
VLDL: 36 mg/dL (ref 0–40)

## 2008-02-24 ENCOUNTER — Encounter (INDEPENDENT_AMBULATORY_CARE_PROVIDER_SITE_OTHER): Payer: Self-pay | Admitting: *Deleted

## 2008-02-24 DIAGNOSIS — E785 Hyperlipidemia, unspecified: Secondary | ICD-10-CM | POA: Insufficient documentation

## 2008-02-24 DIAGNOSIS — E669 Obesity, unspecified: Secondary | ICD-10-CM | POA: Insufficient documentation

## 2008-02-24 DIAGNOSIS — N184 Chronic kidney disease, stage 4 (severe): Secondary | ICD-10-CM | POA: Insufficient documentation

## 2008-02-24 DIAGNOSIS — E890 Postprocedural hypothyroidism: Secondary | ICD-10-CM | POA: Insufficient documentation

## 2008-02-24 DIAGNOSIS — I10 Essential (primary) hypertension: Secondary | ICD-10-CM | POA: Insufficient documentation

## 2008-02-24 DIAGNOSIS — M479 Spondylosis, unspecified: Secondary | ICD-10-CM | POA: Insufficient documentation

## 2008-02-24 DIAGNOSIS — I428 Other cardiomyopathies: Secondary | ICD-10-CM | POA: Insufficient documentation

## 2008-02-24 DIAGNOSIS — K219 Gastro-esophageal reflux disease without esophagitis: Secondary | ICD-10-CM | POA: Insufficient documentation

## 2008-02-25 ENCOUNTER — Encounter: Payer: Self-pay | Admitting: Family Medicine

## 2008-02-25 ENCOUNTER — Ambulatory Visit: Payer: Self-pay | Admitting: Family Medicine

## 2008-02-25 LAB — CONVERTED CEMR LAB
Glucose, Bld: 130 mg/dL
Hgb A1c MFr Bld: 6.4 %
TSH: 2.441 microintl units/mL (ref 0.350–5.50)

## 2008-04-27 ENCOUNTER — Telehealth: Payer: Self-pay | Admitting: Family Medicine

## 2008-05-01 IMAGING — CT CT ABDOMEN W/ CM
2 of 4 series · 13 of 32 positions shown, 19 images · IV contrast (Omnipaque 300)
Comparison: Abdominal CT 09/07/04 and renal ultrasound 02/15/05.

CLINICAL DATA: Follow-up splenic mass.  No history of malignancy.
ABDOMEN CT WITH CONTRAST:
TECHNIQUE: Multidetector CT imaging of the abdomen was performed following the standard protocol during bolus administration of intravenous contrast. 
Contrast:  100 cc Omnipaque 300 IV. Oral contrast was given.

[Series 2: abd_pel 5.0 b40f · axial · 0.69mm/px · z∈[-268,-64]mm · 11 of 51 slices shown, 17 images]
[im 5/51  soft-tissue]
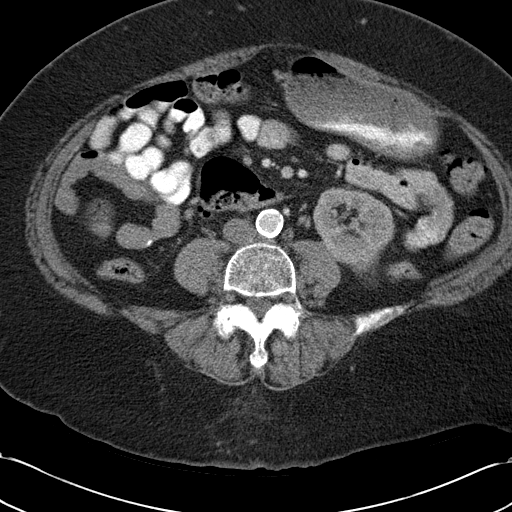
[im 5/51  bone]
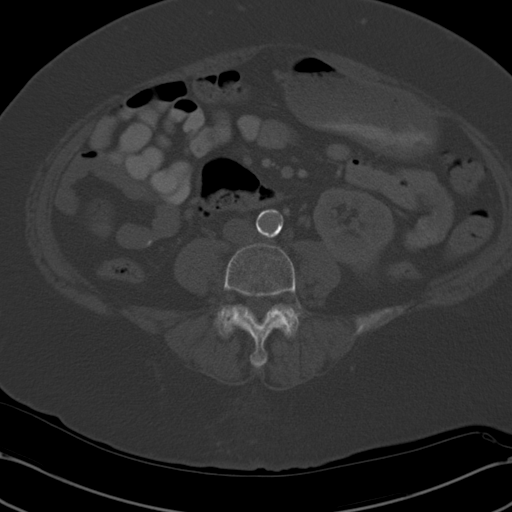
[im 9/51  soft-tissue]
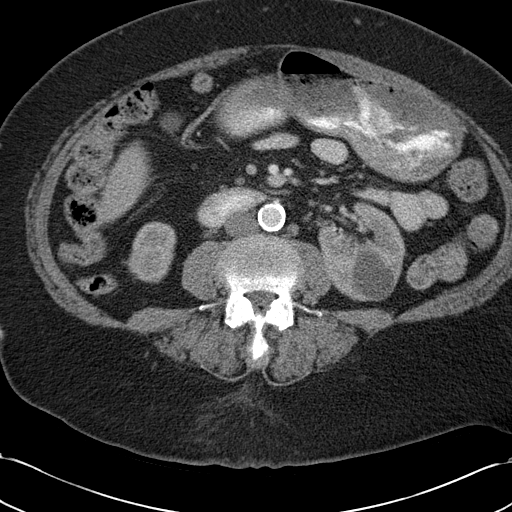
[im 13/51  soft-tissue]
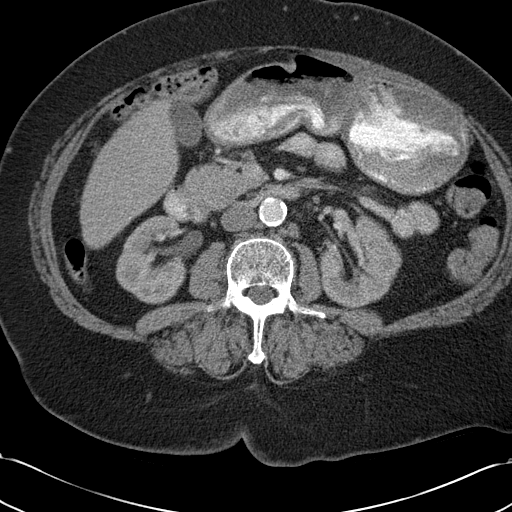
[im 17/51  soft-tissue]
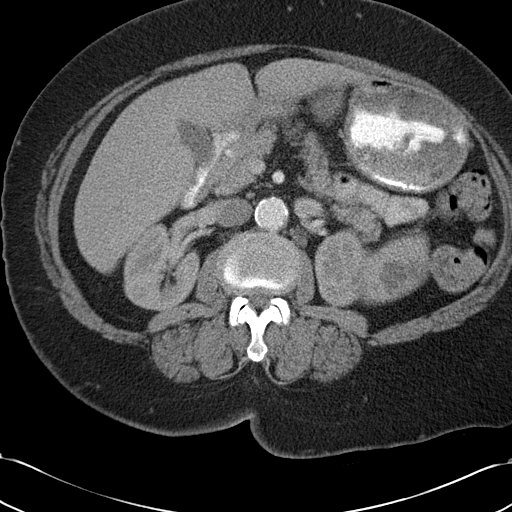
[im 21/51  soft-tissue]
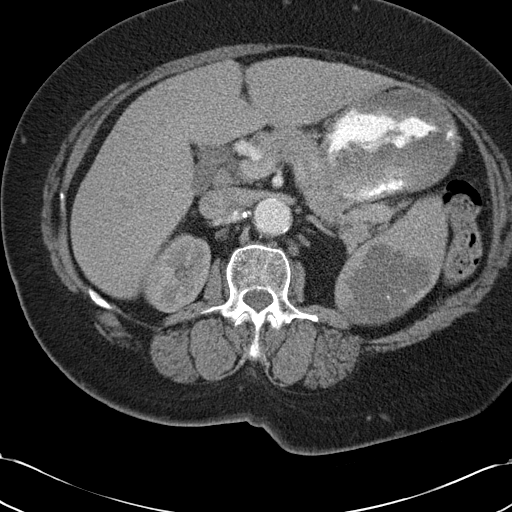
[im 26/51  soft-tissue]
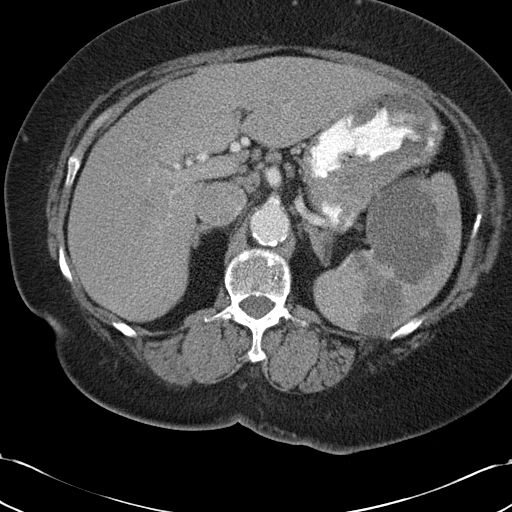
[im 30/51  soft-tissue]
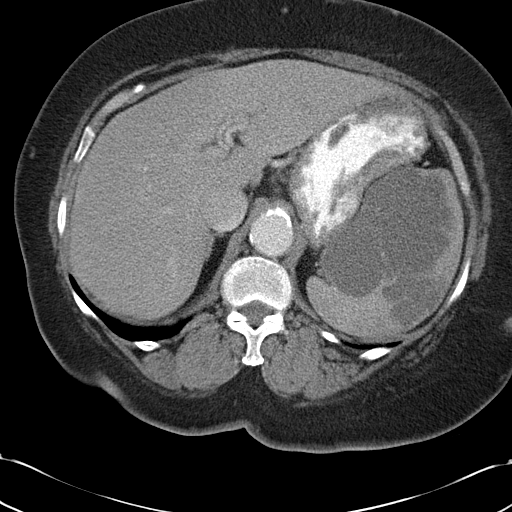
[im 34/51  soft-tissue]
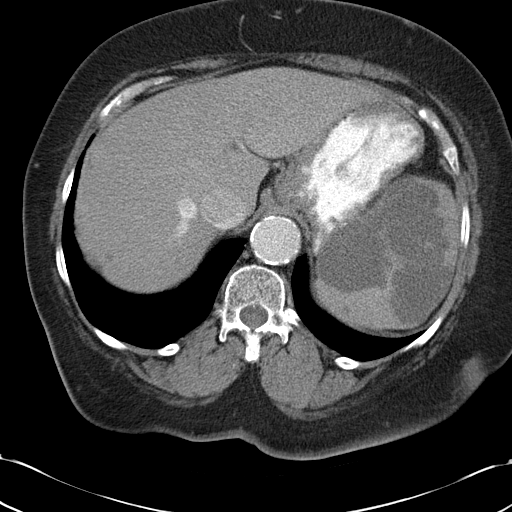
[im 34/51  lung]
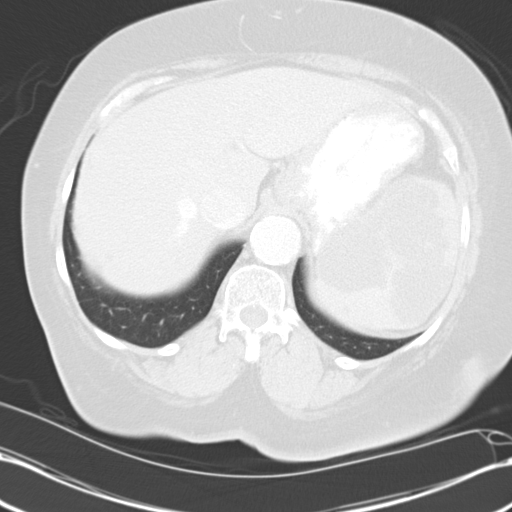
[im 38/51  soft-tissue]
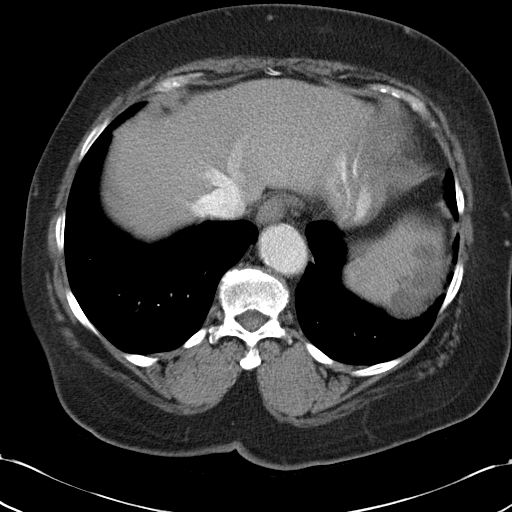
[im 38/51  lung]
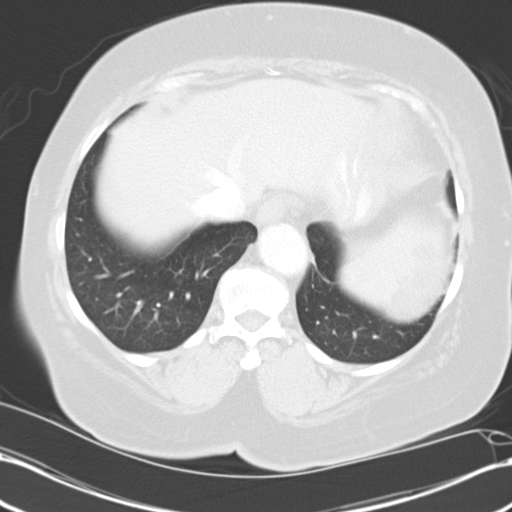
[im 38/51  bone]
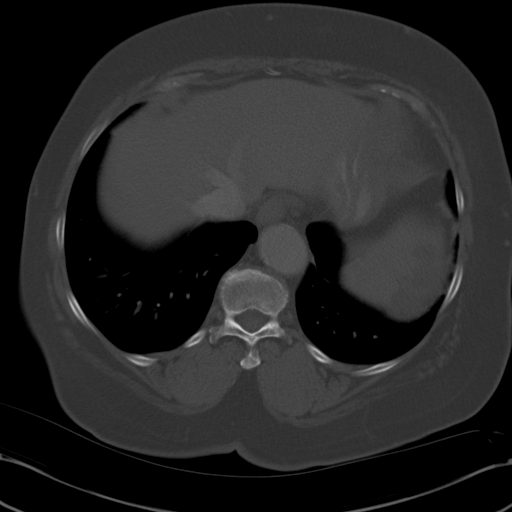
[im 42/51  soft-tissue]
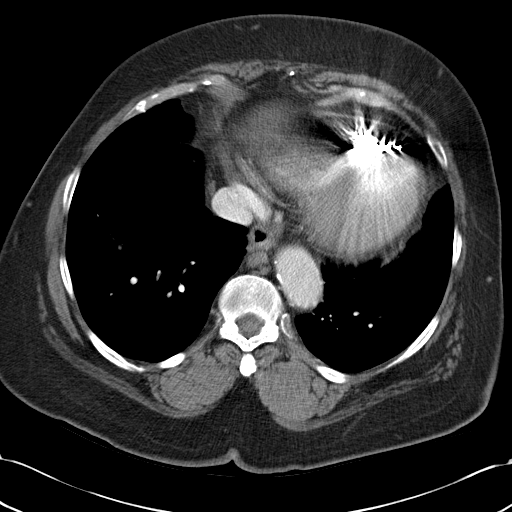
[im 42/51  lung]
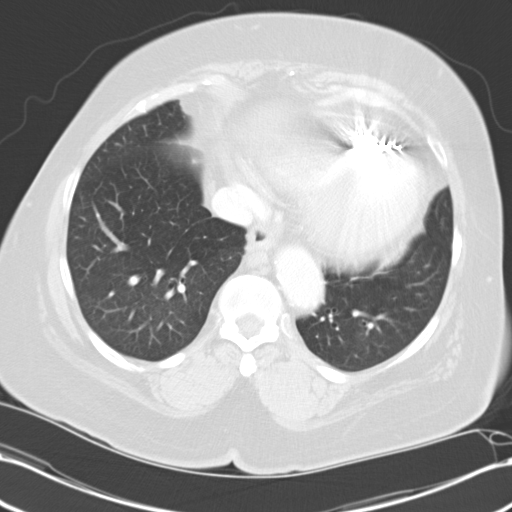
[im 46/51  soft-tissue]
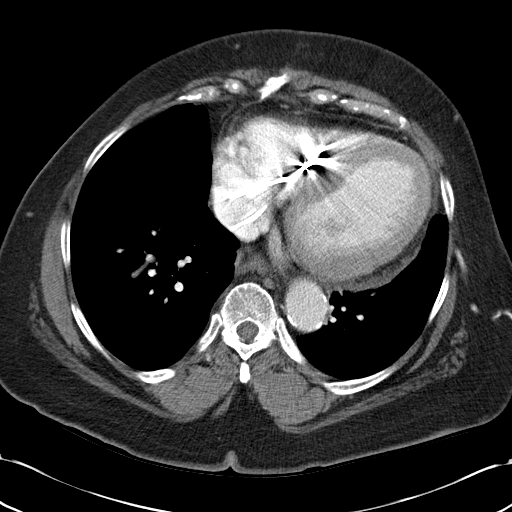
[im 46/51  lung]
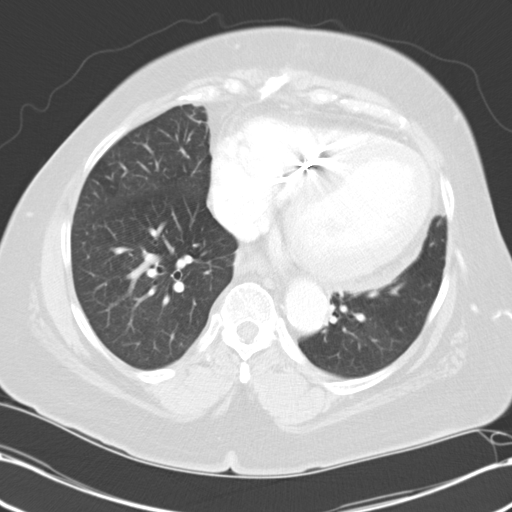

[Series 3: lung 5.0 b70f · axial · 0.69mm/px · z∈[-118,-94]mm · 2 of 22 slices shown]
[im 6/22  bone]
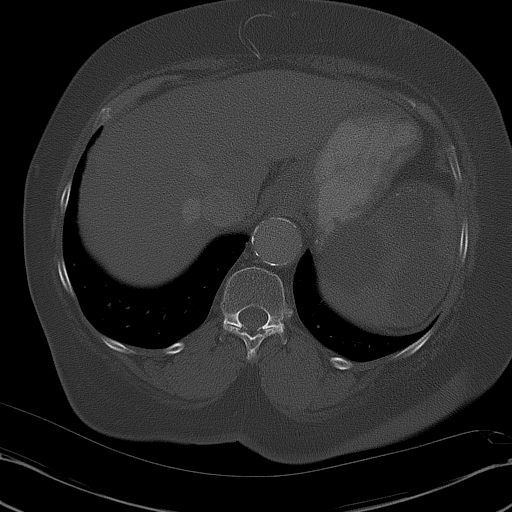
[im 11/22  bone]
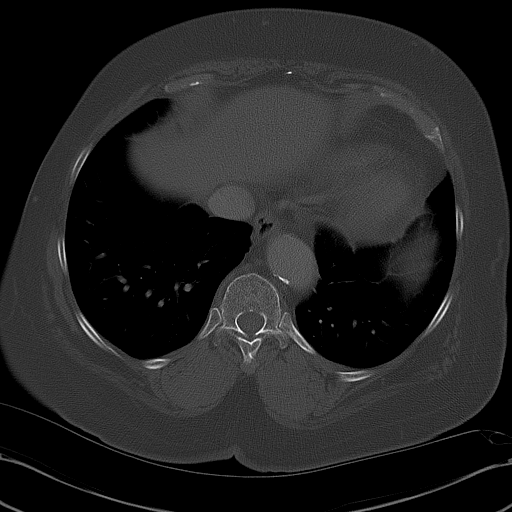

[13 of 32 positions shown; findings below may reference images not displayed]

FINDINGS: The lung bases are clear.  There is no pleural effusion.  Cardiac pacemaker leads are noted.  The heart is mildly enlarged. 
As demonstrated previously, there is moderate splenomegaly with multiple low density splenic lesions, some of which demonstrate peripheral thin calcifications.  Compared with the prior examination, the cystic lesions do not appear significantly changed in size with the largest lesion superiorly measuring up to 6.6 to 7.2 cm transverse on image #24.  There is a lesser contrast bolus on the current examination.  No enhancing splenic lesions are identified.
A subcentimeter low density lesion in the dome of the right hepatic lobe on image #18 is unchanged.  The liver otherwise appears unremarkable.  The gallbladder, spleen, and adrenal glands appear normal.  Prominent lymph nodes in the portacaval space, gastrohepatic ligament, and porta hepatis are unchanged.  Simple renal cysts bilaterally are unchanged, measuring up to 3.6 x 3.0 cm in the lower pole of the left kidney.
IMPRESSION: 1.  Stable complex cysts in the spleen with peripheral calcifications since August 2004.  The multiplicity suggests a post-inflammatory process, possibly echinococcal or hydatid cysts.  Correlate clinically.    
2.  No acute abdominal findings are demonstrated.
3.  Stable bilateral renal cysts.

## 2008-05-06 ENCOUNTER — Encounter (HOSPITAL_COMMUNITY): Admission: RE | Admit: 2008-05-06 | Discharge: 2008-06-05 | Payer: Self-pay | Admitting: Oncology

## 2008-05-06 ENCOUNTER — Ambulatory Visit (HOSPITAL_COMMUNITY): Payer: Self-pay | Admitting: Oncology

## 2008-05-14 ENCOUNTER — Encounter: Payer: Self-pay | Admitting: Family Medicine

## 2008-06-10 ENCOUNTER — Ambulatory Visit: Payer: Self-pay | Admitting: Family Medicine

## 2008-06-10 LAB — CONVERTED CEMR LAB
Glucose, Bld: 91 mg/dL
Hgb A1c MFr Bld: 6.5 %

## 2008-06-15 DIAGNOSIS — R7303 Prediabetes: Secondary | ICD-10-CM | POA: Insufficient documentation

## 2008-06-21 IMAGING — CR DG CHEST 2V
2 series · 2 of 2 positions shown · non-contrast
Comparison: none

HISTORY: Bronchitis, cough

[view not recorded (1 of 2)]
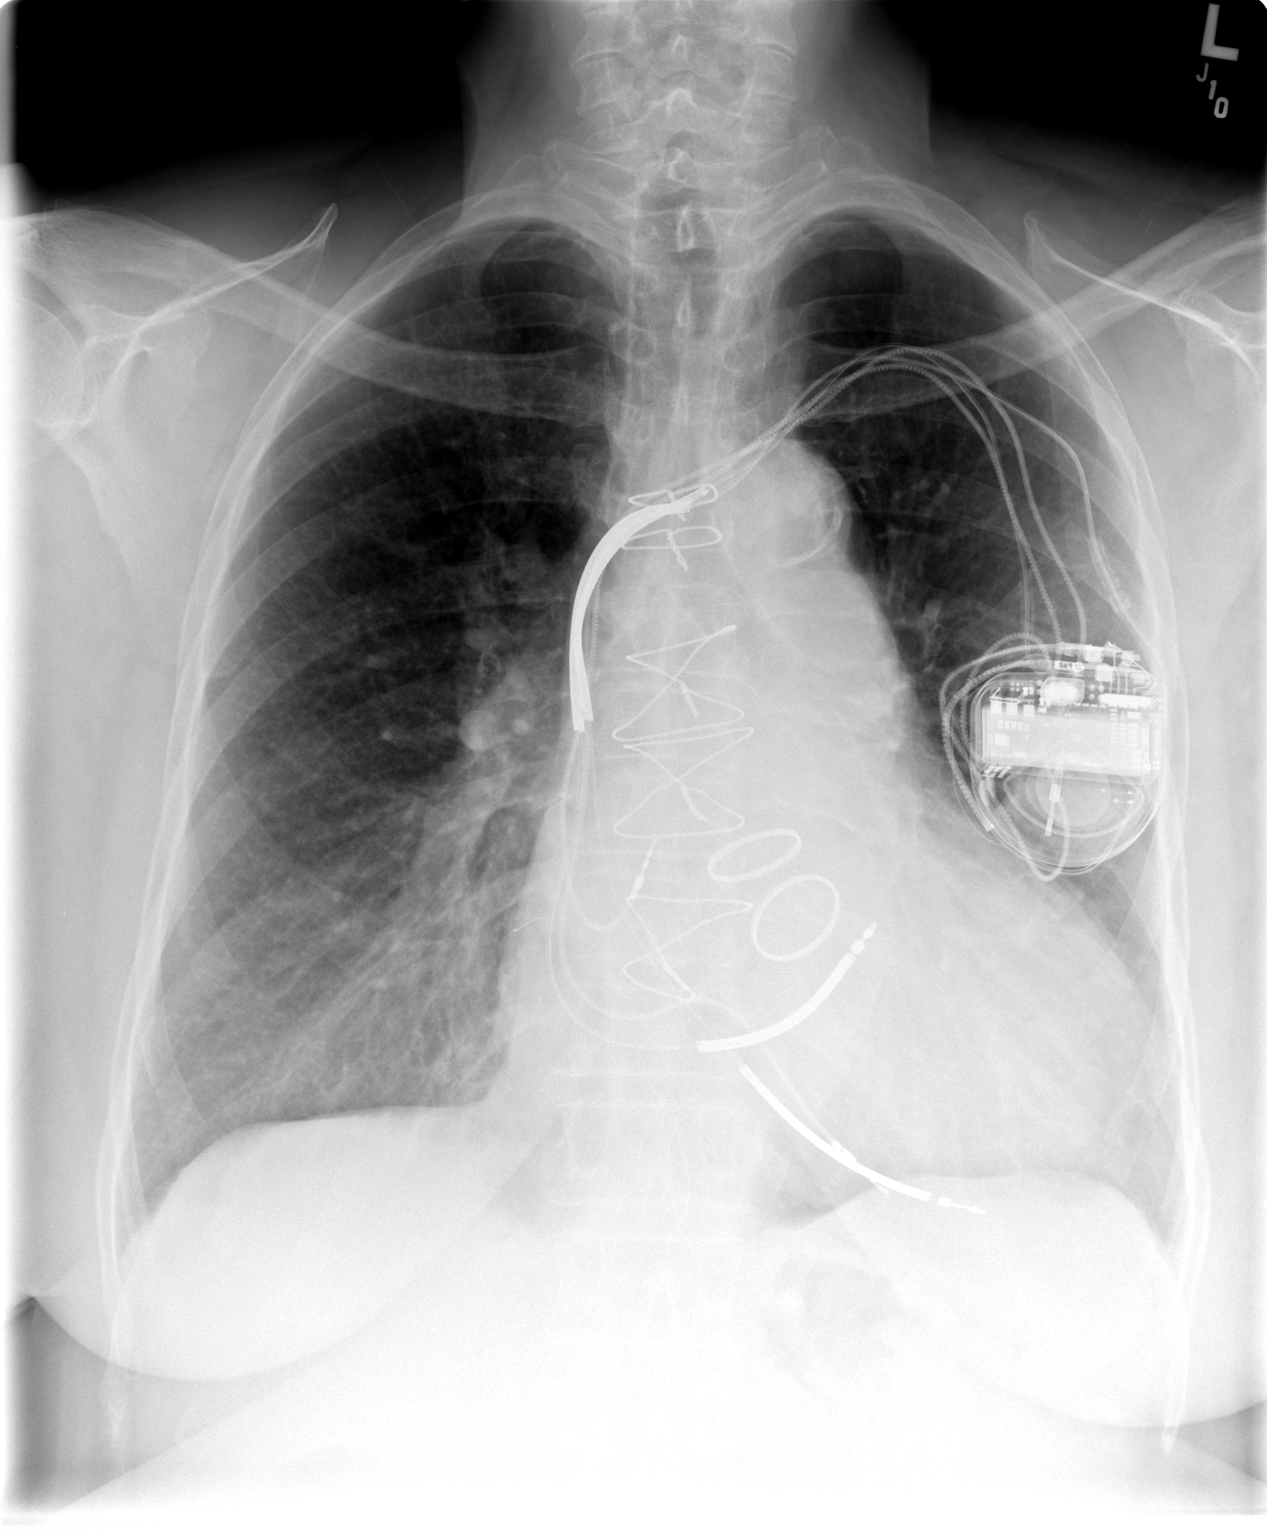

[view not recorded (2 of 2)]
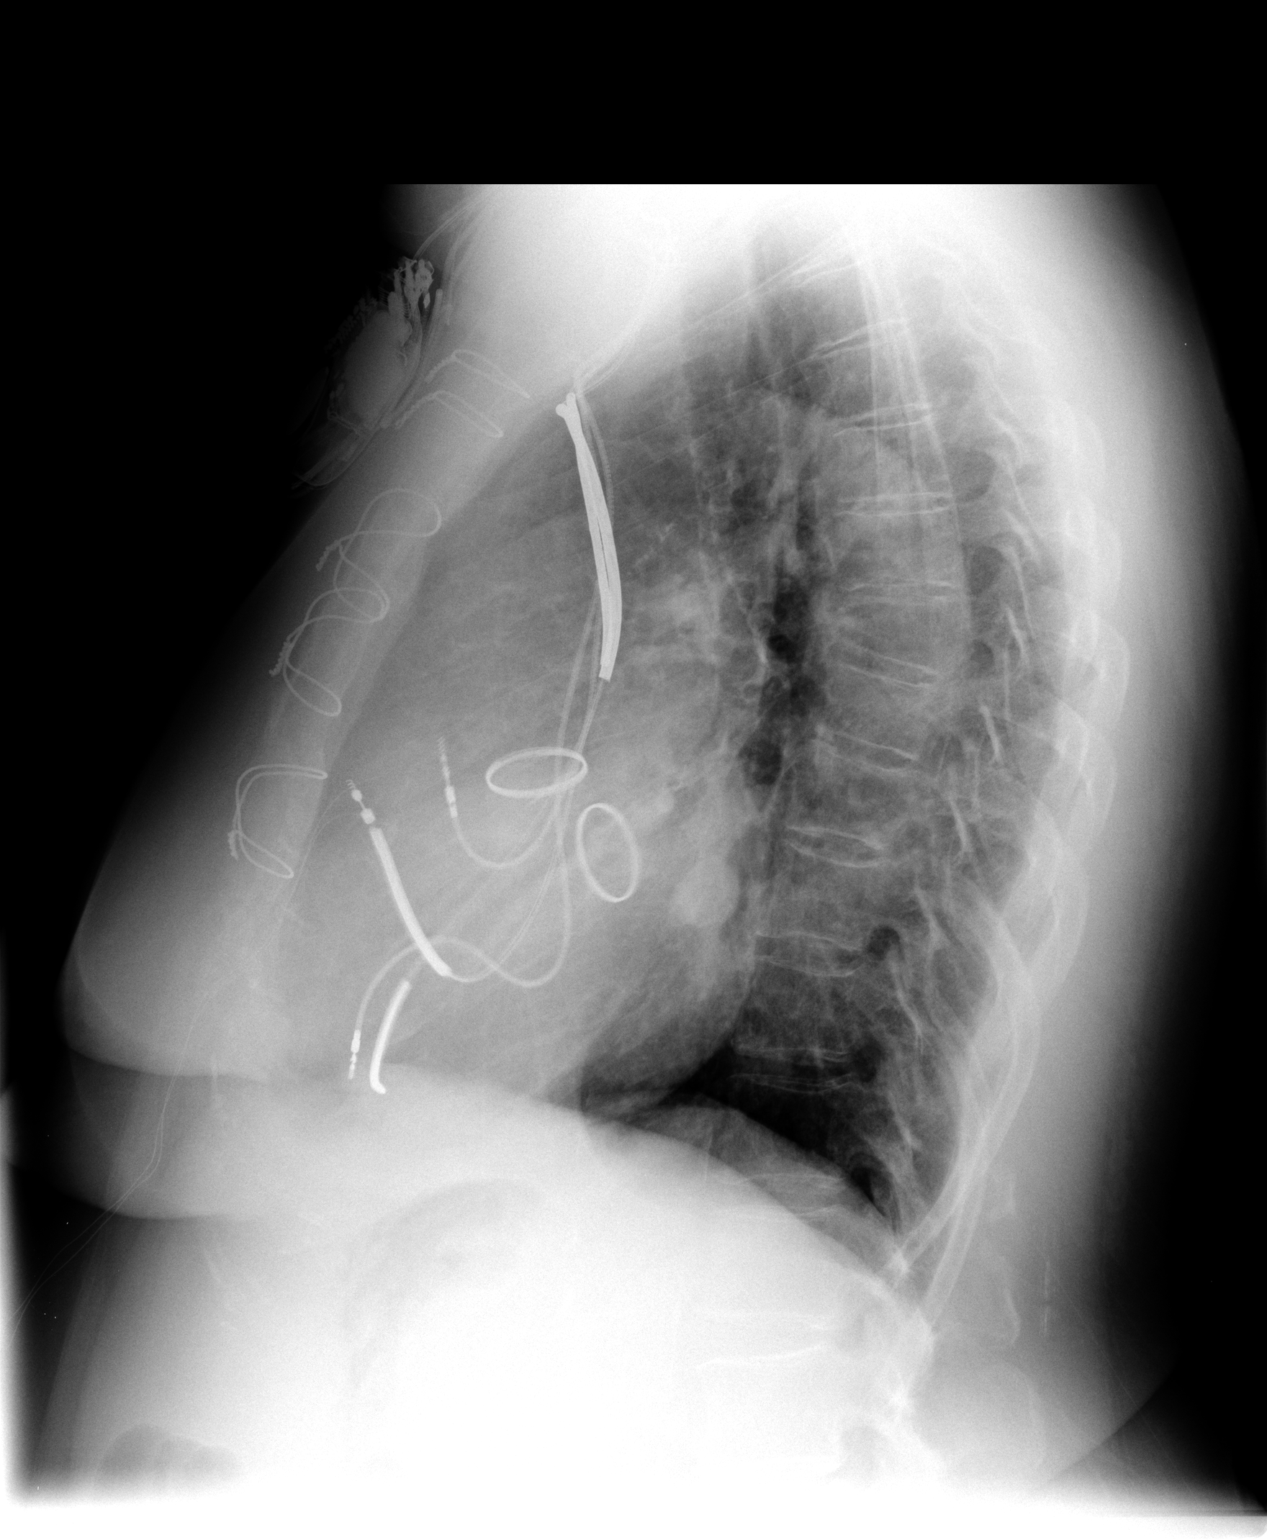

[2 of 2 positions shown; findings below may reference images not displayed]

CHEST 2 VIEWS:

Comparison 03/27/2006

Cardiac enlargement status post median sternotomy, AVR, and MVR.
Tortuous calcified thoracic aorta.
Pulmonary vascularity normal.
Left subclavian transvenous pacemaker and AICD leads stable.
No infiltrate or effusion.
COPD with chronic bronchitic and interstitial changes stable.
No pleural effusion.
Mild compression deformity of a midthoracic vertebra is unchanged since previous
exam.
IMPRESSION: Cardiomegaly status post median sternotomy, AVR, MVR, pacemaker and AICD.
Chronic bronchitic interstitial changes with probable underlying COPD.
No acute abnormalities.

## 2008-06-22 ENCOUNTER — Ambulatory Visit: Payer: Self-pay | Admitting: Family Medicine

## 2008-07-15 ENCOUNTER — Encounter: Payer: Self-pay | Admitting: Family Medicine

## 2008-07-21 ENCOUNTER — Encounter: Payer: Self-pay | Admitting: Family Medicine

## 2008-07-22 ENCOUNTER — Ambulatory Visit: Payer: Self-pay | Admitting: Family Medicine

## 2008-07-22 LAB — CONVERTED CEMR LAB
Bilirubin Urine: NEGATIVE
Blood in Urine, dipstick: NEGATIVE
Glucose, Bld: 118 mg/dL
Glucose, Urine, Semiquant: NEGATIVE
Ketones, urine, test strip: NEGATIVE
Nitrite: NEGATIVE
Protein, U semiquant: NEGATIVE
Specific Gravity, Urine: <1.005
Urobilinogen, UA: 0.2
WBC Urine, dipstick: NEGATIVE
pH: 6

## 2008-07-27 ENCOUNTER — Encounter: Payer: Self-pay | Admitting: Family Medicine

## 2008-07-28 ENCOUNTER — Telehealth: Payer: Self-pay | Admitting: Family Medicine

## 2008-07-30 LAB — CONVERTED CEMR LAB
ALT: 10 units/L (ref 0–35)
AST: 20 units/L (ref 0–37)
Albumin: 4.2 g/dL (ref 3.5–5.2)
Alkaline Phosphatase: 38 units/L — ABNORMAL LOW (ref 39–117)
BUN: 25 mg/dL — ABNORMAL HIGH (ref 6–23)
Basophils Absolute: 0 10*3/uL (ref 0.0–0.1)
Basophils Relative: 0 % (ref 0–1)
Bilirubin, Direct: <0.1 mg/dL (ref 0.0–0.3)
CO2: 25 meq/L (ref 19–32)
Calcium: 9.4 mg/dL (ref 8.4–10.5)
Chloride: 100 meq/L (ref 96–112)
Cholesterol: 103 mg/dL (ref 0–200)
Creatinine, Ser: 1.9 mg/dL — ABNORMAL HIGH (ref 0.40–1.20)
Eosinophils Absolute: 0 10*3/uL (ref 0.0–0.7)
Eosinophils Relative: 0 % (ref 0–5)
Glucose, Bld: 107 mg/dL — ABNORMAL HIGH (ref 70–99)
HCT: 32.1 % — ABNORMAL LOW (ref 36.0–46.0)
HDL: 38 mg/dL — ABNORMAL LOW (ref >39)
Hemoglobin: 10.2 g/dL — ABNORMAL LOW (ref 12.0–15.0)
LDL Cholesterol: 46 mg/dL (ref 0–99)
Lymphocytes Relative: 19 % (ref 12–46)
Lymphs Abs: 1.1 10*3/uL (ref 0.7–4.0)
MCHC: 31.8 g/dL (ref 30.0–36.0)
MCV: 87 fL (ref 78.0–100.0)
Monocytes Absolute: 0.4 10*3/uL (ref 0.1–1.0)
Monocytes Relative: 7 % (ref 3–12)
Neutro Abs: 4.4 10*3/uL (ref 1.7–7.7)
Neutrophils Relative %: 74 % (ref 43–77)
Platelets: 278 10*3/uL (ref 150–400)
Potassium: 4.6 meq/L (ref 3.5–5.3)
RBC: 3.69 M/uL — ABNORMAL LOW (ref 3.87–5.11)
RDW: 16.3 % — ABNORMAL HIGH (ref 11.5–15.5)
Sodium: 138 meq/L (ref 135–145)
TSH: 2.134 microintl units/mL (ref 0.350–4.50)
Total Bilirubin: 0.3 mg/dL (ref 0.3–1.2)
Total CHOL/HDL Ratio: 2.7
Total Protein: 8.2 g/dL (ref 6.0–8.3)
Triglycerides: 95 mg/dL (ref <150)
VLDL: 19 mg/dL (ref 0–40)
WBC: 5.9 10*3/uL (ref 4.0–10.5)

## 2008-07-31 ENCOUNTER — Encounter: Payer: Self-pay | Admitting: Family Medicine

## 2008-07-31 LAB — CONVERTED CEMR LAB
Creatinine, Urine: 111.2 mg/dL
Microalb Creat Ratio: 42.8 mg/g — ABNORMAL HIGH (ref 0.0–30.0)
Microalb, Ur: 4.76 mg/dL — ABNORMAL HIGH (ref 0.00–1.89)

## 2008-08-03 ENCOUNTER — Encounter: Payer: Self-pay | Admitting: Family Medicine

## 2008-08-24 ENCOUNTER — Telehealth: Payer: Self-pay | Admitting: Family Medicine

## 2008-08-29 ENCOUNTER — Emergency Department (HOSPITAL_COMMUNITY): Admission: EM | Admit: 2008-08-29 | Discharge: 2008-08-29 | Payer: Self-pay | Admitting: Emergency Medicine

## 2008-09-01 ENCOUNTER — Ambulatory Visit: Payer: Self-pay | Admitting: Family Medicine

## 2008-09-01 LAB — CONVERTED CEMR LAB
Glucose, Bld: 139 mg/dL
INR: 6.8 (ref 0.0–1.5)
Prothrombin Time: 66.4 s — ABNORMAL HIGH (ref 11.6–15.2)

## 2008-09-05 DIAGNOSIS — Z954 Presence of other heart-valve replacement: Secondary | ICD-10-CM | POA: Insufficient documentation

## 2008-09-08 ENCOUNTER — Telehealth: Payer: Self-pay | Admitting: Family Medicine

## 2008-09-10 ENCOUNTER — Encounter: Payer: Self-pay | Admitting: Family Medicine

## 2008-09-16 ENCOUNTER — Encounter: Payer: Self-pay | Admitting: Family Medicine

## 2008-09-16 LAB — CONVERTED CEMR LAB: TSH: 1.902 microintl units/mL (ref 0.350–4.50)

## 2008-09-22 ENCOUNTER — Telehealth: Payer: Self-pay | Admitting: Family Medicine

## 2008-09-22 ENCOUNTER — Encounter: Payer: Self-pay | Admitting: Family Medicine

## 2008-09-23 ENCOUNTER — Encounter: Payer: Self-pay | Admitting: Family Medicine

## 2008-09-23 ENCOUNTER — Telehealth: Payer: Self-pay | Admitting: Family Medicine

## 2008-09-30 ENCOUNTER — Encounter: Payer: Self-pay | Admitting: Family Medicine

## 2008-11-02 ENCOUNTER — Encounter (HOSPITAL_COMMUNITY): Admission: RE | Admit: 2008-11-02 | Discharge: 2008-12-02 | Payer: Self-pay | Admitting: Oncology

## 2008-11-02 ENCOUNTER — Ambulatory Visit (HOSPITAL_COMMUNITY): Payer: Self-pay | Admitting: Oncology

## 2008-11-02 ENCOUNTER — Telehealth: Payer: Self-pay | Admitting: Family Medicine

## 2008-11-03 ENCOUNTER — Encounter: Payer: Self-pay | Admitting: Family Medicine

## 2008-11-05 ENCOUNTER — Encounter: Payer: Self-pay | Admitting: Family Medicine

## 2008-11-10 ENCOUNTER — Ambulatory Visit: Payer: Self-pay | Admitting: Family Medicine

## 2008-11-10 DIAGNOSIS — S139XXA Sprain of joints and ligaments of unspecified parts of neck, initial encounter: Secondary | ICD-10-CM | POA: Insufficient documentation

## 2008-11-10 LAB — CONVERTED CEMR LAB
Glucose, Bld: 102 mg/dL
Hgb A1c MFr Bld: 6.4 %

## 2008-11-11 ENCOUNTER — Telehealth: Payer: Self-pay | Admitting: Family Medicine

## 2008-11-11 ENCOUNTER — Encounter: Payer: Self-pay | Admitting: Family Medicine

## 2008-11-11 LAB — CONVERTED CEMR LAB: Digitoxin Lvl: 1 ng/mL (ref 0.8–2.0)

## 2008-11-17 ENCOUNTER — Encounter: Payer: Self-pay | Admitting: Family Medicine

## 2008-11-20 ENCOUNTER — Encounter: Payer: Self-pay | Admitting: Family Medicine

## 2008-11-20 ENCOUNTER — Telehealth: Payer: Self-pay | Admitting: Family Medicine

## 2008-12-04 IMAGING — CT CT ABDOMEN W/ CM
1 of 3 series · 12 of 32 positions shown, 18 images · IV contrast (Omnipaque 300)
Comparison: 06/24/07

CLINICAL DATA: Follow up
Abnormal liver lesion.

CT ABDOMEN WITH CONTRAST
TECHNIQUE: Multidetector CT imaging of the abdomen was performed
following the standard protocol during bolus administration of
intravenous contrast.
Contrast: 100 ml omni 300

[Series 6: kidney delay 5.0 b40f · axial · delayed · 0.82mm/px · z∈[-283,-58]mm · 12 of 55 slices shown, 18 images]
[im 5/55  soft-tissue]
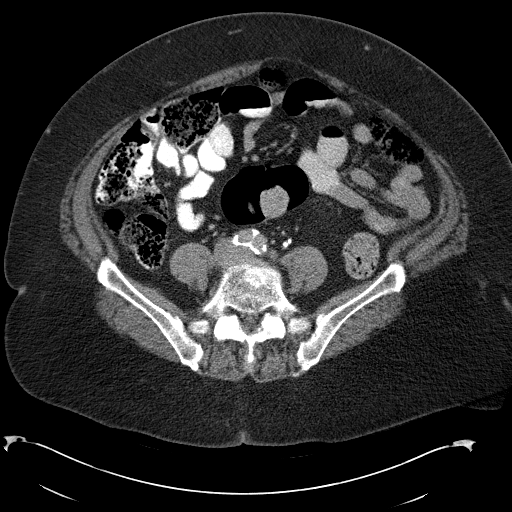
[im 5/55  bone]
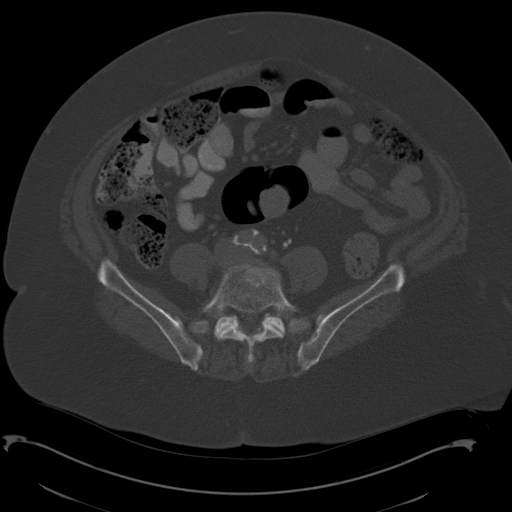
[im 9/55  soft-tissue]
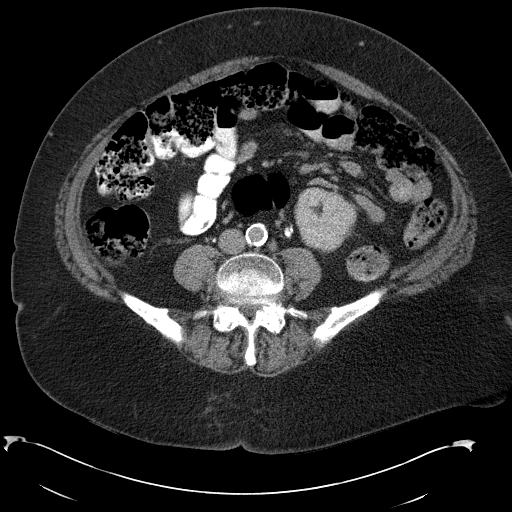
[im 13/55  soft-tissue]
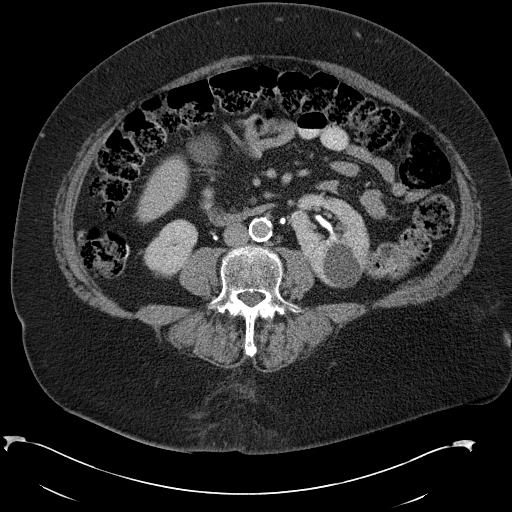
[im 17/55  soft-tissue]
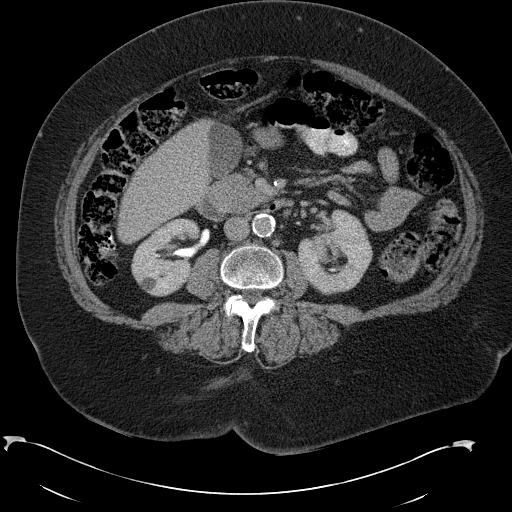
[im 21/55  soft-tissue]
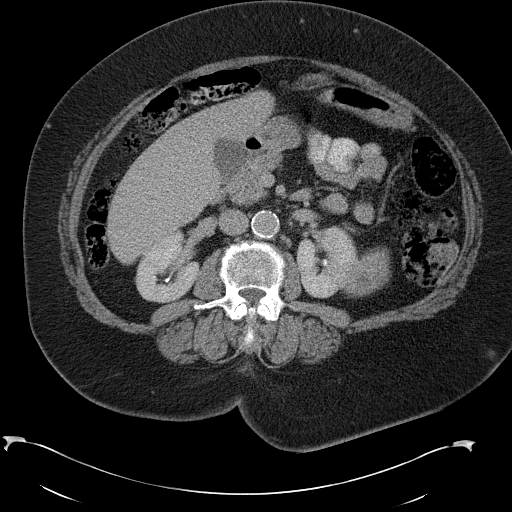
[im 25/55  soft-tissue]
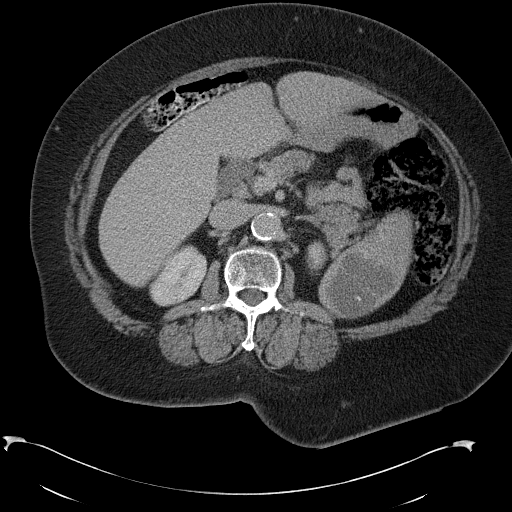
[im 30/55  soft-tissue]
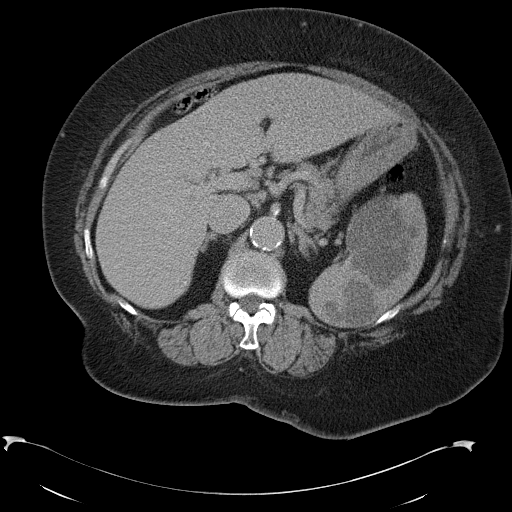
[im 34/55  soft-tissue]
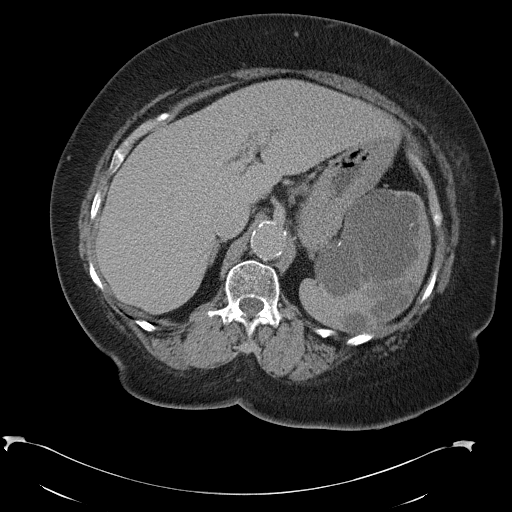
[im 38/55  soft-tissue]
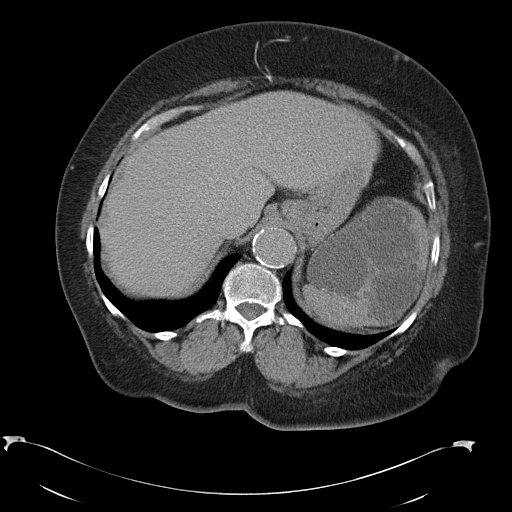
[im 38/55  lung]
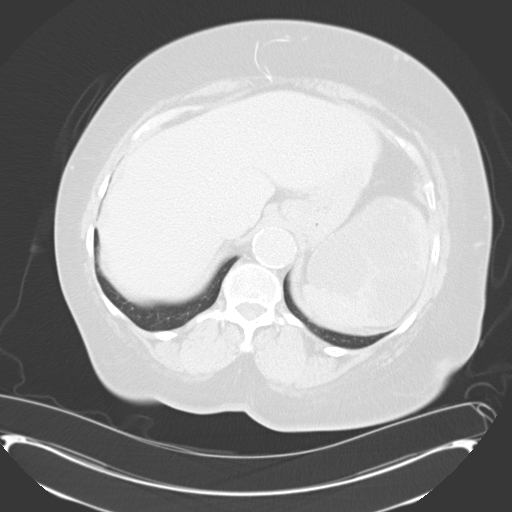
[im 38/55  bone]
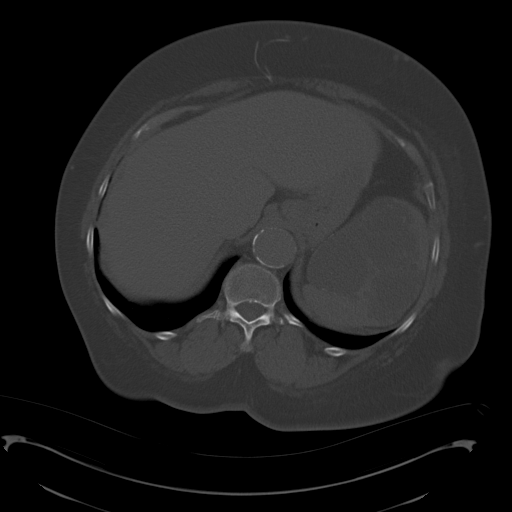
[im 42/55  soft-tissue]
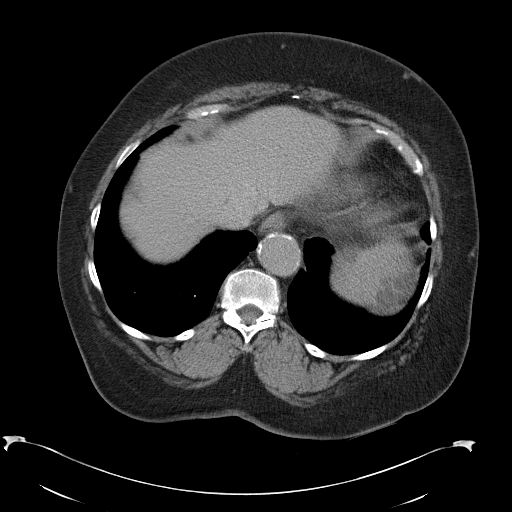
[im 42/55  lung]
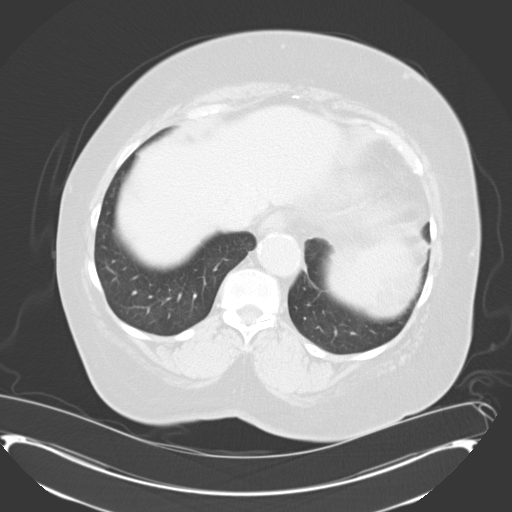
[im 46/55  soft-tissue]
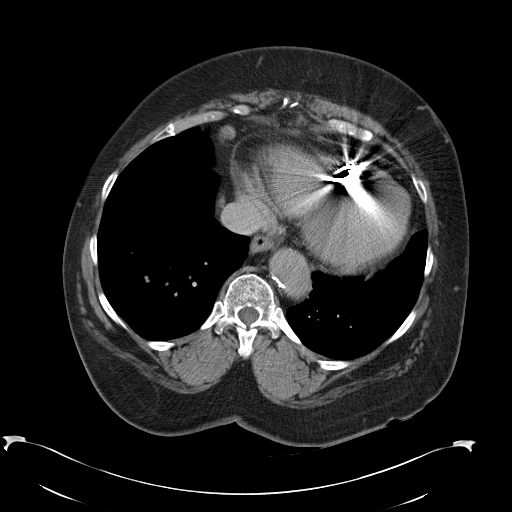
[im 46/55  lung]
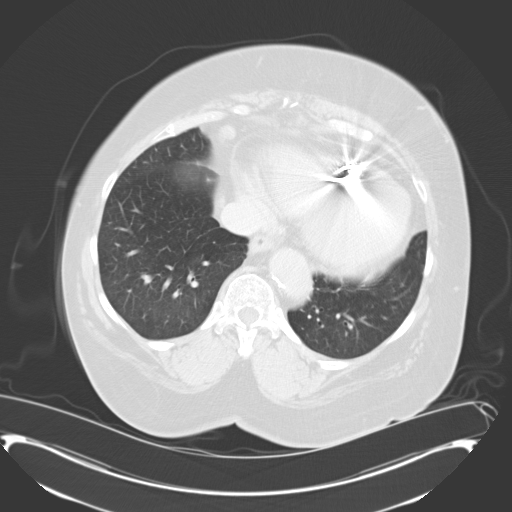
[im 50/55  soft-tissue]
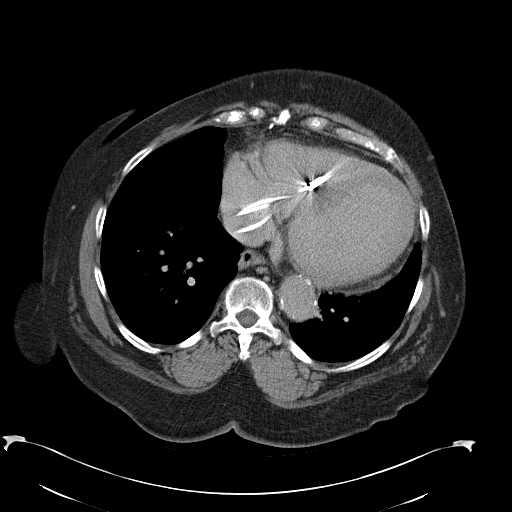
[im 50/55  lung]
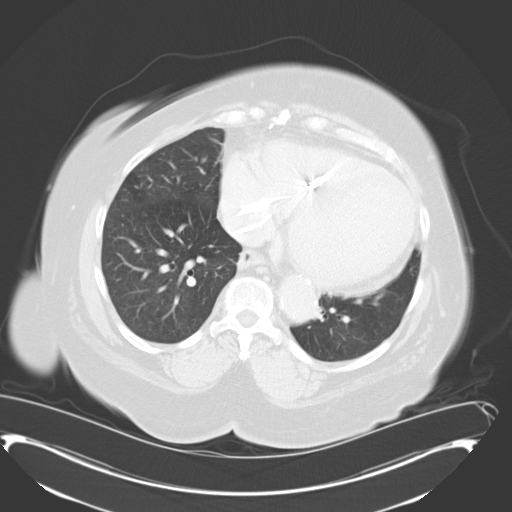

[12 of 32 positions shown; findings below may reference images not displayed]

FINDINGS: The lung bases are clear.

The heart size is enlarged.  The leads from an AICD device are
identified within the right atrium and right ventricle.

The adrenal glands are negative.

The pancreas is negative.

The right kidney is normal.

A cyst arises from the lower pole of the left kidney, image 42.
This is stable from prior exam measuring 3.6 cm.

The pancreatic parenchyma is normal.

The gallbladder is normal.

Tiny 4.8 mm hypodensity within the right hepatic lobe is stable
from prior exam.

There are multiple fluid density lesions involving the splenic
parenchyma many of which are peripherally calcified: index lesion
measures 7.6 by 6.2 cm and is unchanged from prior exam.

The visualized bowel loops of the upper abdomen are nondilated.

There is no free fluid, or abnormal fluid collections.

Review of the visualized osseous structures is unremarkable.
IMPRESSION: 1.  Stable complex partially calcified cysts within the splenic
parenchyma.
2.  No change in tiny hypodensity within the right lobe of liver
which is too small to characterize.
3.  No change in renal cyst.

## 2008-12-23 ENCOUNTER — Encounter: Payer: Self-pay | Admitting: Family Medicine

## 2009-01-11 ENCOUNTER — Telehealth: Payer: Self-pay | Admitting: Family Medicine

## 2009-01-12 ENCOUNTER — Telehealth: Payer: Self-pay | Admitting: Family Medicine

## 2009-01-14 ENCOUNTER — Telehealth: Payer: Self-pay | Admitting: Family Medicine

## 2009-01-27 ENCOUNTER — Ambulatory Visit (HOSPITAL_COMMUNITY): Admission: RE | Admit: 2009-01-27 | Discharge: 2009-01-27 | Payer: Self-pay | Admitting: Family Medicine

## 2009-02-09 ENCOUNTER — Ambulatory Visit: Payer: Self-pay | Admitting: Family Medicine

## 2009-02-09 DIAGNOSIS — K8010 Calculus of gallbladder with chronic cholecystitis without obstruction: Secondary | ICD-10-CM | POA: Insufficient documentation

## 2009-02-09 DIAGNOSIS — I83893 Varicose veins of bilateral lower extremities with other complications: Secondary | ICD-10-CM | POA: Insufficient documentation

## 2009-02-09 HISTORY — DX: Calculus of gallbladder with chronic cholecystitis without obstruction: K80.10

## 2009-02-12 LAB — CONVERTED CEMR LAB
ALT: 10 units/L (ref 0–35)
AST: 22 units/L (ref 0–37)
Albumin: 4.1 g/dL (ref 3.5–5.2)
Alkaline Phosphatase: 34 units/L — ABNORMAL LOW (ref 39–117)
BUN: 25 mg/dL — ABNORMAL HIGH (ref 6–23)
Bilirubin, Direct: <0.1 mg/dL (ref 0.0–0.3)
CO2: 25 meq/L (ref 19–32)
Calcium: 9.6 mg/dL (ref 8.4–10.5)
Chloride: 102 meq/L (ref 96–112)
Cholesterol: 98 mg/dL (ref 0–200)
Creatinine, Ser: 1.61 mg/dL — ABNORMAL HIGH (ref 0.40–1.20)
Glucose, Bld: 113 mg/dL — ABNORMAL HIGH (ref 70–99)
HCT: 32.5 % — ABNORMAL LOW (ref 36.0–46.0)
HDL: 29 mg/dL — ABNORMAL LOW (ref >39)
Hemoglobin: 10.2 g/dL — ABNORMAL LOW (ref 12.0–15.0)
LDL Cholesterol: 42 mg/dL (ref 0–99)
MCHC: 31.4 g/dL (ref 30.0–36.0)
MCV: 88.3 fL (ref 78.0–100.0)
Platelets: 272 10*3/uL (ref 150–400)
Potassium: 4.8 meq/L (ref 3.5–5.3)
RBC: 3.68 M/uL — ABNORMAL LOW (ref 3.87–5.11)
RDW: 15.9 % — ABNORMAL HIGH (ref 11.5–15.5)
Sodium: 139 meq/L (ref 135–145)
TSH: 2.403 microintl units/mL (ref 0.350–4.500)
Total Bilirubin: 0.3 mg/dL (ref 0.3–1.2)
Total CHOL/HDL Ratio: 3.4
Total Protein: 8.1 g/dL (ref 6.0–8.3)
Triglycerides: 136 mg/dL (ref <150)
VLDL: 27 mg/dL (ref 0–40)
WBC: 6.2 10*3/uL (ref 4.0–10.5)

## 2009-02-15 ENCOUNTER — Ambulatory Visit (HOSPITAL_COMMUNITY): Admission: RE | Admit: 2009-02-15 | Discharge: 2009-02-15 | Payer: Self-pay | Admitting: Pediatrics

## 2009-02-15 ENCOUNTER — Encounter: Payer: Self-pay | Admitting: Family Medicine

## 2009-03-02 ENCOUNTER — Telehealth: Payer: Self-pay | Admitting: Family Medicine

## 2009-03-05 ENCOUNTER — Encounter: Payer: Self-pay | Admitting: Family Medicine

## 2009-03-09 ENCOUNTER — Encounter: Payer: Self-pay | Admitting: Family Medicine

## 2009-03-10 ENCOUNTER — Encounter: Payer: Self-pay | Admitting: Family Medicine

## 2009-03-10 ENCOUNTER — Ambulatory Visit: Payer: Self-pay | Admitting: Vascular Surgery

## 2009-03-11 ENCOUNTER — Encounter: Payer: Self-pay | Admitting: Family Medicine

## 2009-03-15 ENCOUNTER — Ambulatory Visit: Payer: Self-pay | Admitting: Family Medicine

## 2009-03-16 ENCOUNTER — Encounter: Payer: Self-pay | Admitting: Family Medicine

## 2009-03-19 ENCOUNTER — Encounter: Payer: Self-pay | Admitting: Family Medicine

## 2009-03-22 ENCOUNTER — Encounter: Payer: Self-pay | Admitting: Family Medicine

## 2009-03-25 ENCOUNTER — Telehealth: Payer: Self-pay | Admitting: Family Medicine

## 2009-04-09 ENCOUNTER — Ambulatory Visit: Payer: Self-pay | Admitting: Family Medicine

## 2009-04-09 LAB — CONVERTED CEMR LAB
Bilirubin Urine: NEGATIVE
Glucose, Urine, Semiquant: NEGATIVE
Ketones, urine, test strip: NEGATIVE
Nitrite: NEGATIVE
Protein, U semiquant: NEGATIVE
Specific Gravity, Urine: 1.01
Urobilinogen, UA: 0.2
WBC Urine, dipstick: NEGATIVE
pH: 5.5

## 2009-04-12 ENCOUNTER — Encounter: Payer: Self-pay | Admitting: Family Medicine

## 2009-04-23 ENCOUNTER — Ambulatory Visit: Payer: Self-pay | Admitting: Family Medicine

## 2009-04-23 DIAGNOSIS — M25559 Pain in unspecified hip: Secondary | ICD-10-CM

## 2009-04-23 HISTORY — DX: Pain in unspecified hip: M25.559

## 2009-04-23 LAB — CONVERTED CEMR LAB: Glucose, Bld: 63 mg/dL

## 2009-04-26 ENCOUNTER — Ambulatory Visit (HOSPITAL_COMMUNITY): Admission: RE | Admit: 2009-04-26 | Discharge: 2009-04-26 | Payer: Self-pay | Admitting: Orthopaedic Surgery

## 2009-05-19 ENCOUNTER — Ambulatory Visit: Payer: Self-pay | Admitting: Family Medicine

## 2009-05-19 LAB — CONVERTED CEMR LAB
Glucose, Bld: 128 mg/dL
Hgb A1c MFr Bld: 6.6 %

## 2009-06-08 ENCOUNTER — Telehealth: Payer: Self-pay | Admitting: Family Medicine

## 2009-06-09 ENCOUNTER — Telehealth: Payer: Self-pay | Admitting: Family Medicine

## 2009-06-11 ENCOUNTER — Encounter: Payer: Self-pay | Admitting: Family Medicine

## 2009-06-24 ENCOUNTER — Ambulatory Visit: Payer: Self-pay | Admitting: Family Medicine

## 2009-06-25 LAB — CONVERTED CEMR LAB
ALT: 12 units/L (ref 0–35)
AST: 27 units/L (ref 0–37)
Albumin: 4.4 g/dL (ref 3.5–5.2)
Alkaline Phosphatase: 37 units/L — ABNORMAL LOW (ref 39–117)
BUN: 32 mg/dL — ABNORMAL HIGH (ref 6–23)
Bilirubin, Direct: 0.2 mg/dL (ref 0.0–0.3)
CO2: 26 meq/L (ref 19–32)
Calcium: 10.5 mg/dL (ref 8.4–10.5)
Chloride: 96 meq/L (ref 96–112)
Cholesterol: 117 mg/dL (ref 0–200)
Creatinine, Ser: 1.69 mg/dL — ABNORMAL HIGH (ref 0.40–1.20)
Glucose, Bld: 104 mg/dL — ABNORMAL HIGH (ref 70–99)
HDL: 35 mg/dL — ABNORMAL LOW (ref >39)
Indirect Bilirubin: 0.2 mg/dL (ref 0.0–0.9)
LDL Cholesterol: 55 mg/dL (ref 0–99)
Potassium: 4.6 meq/L (ref 3.5–5.3)
Sodium: 137 meq/L (ref 135–145)
TSH: 1.787 microintl units/mL (ref 0.350–4.500)
Total Bilirubin: 0.4 mg/dL (ref 0.3–1.2)
Total CHOL/HDL Ratio: 3.3
Total Protein: 8.3 g/dL (ref 6.0–8.3)
Triglycerides: 135 mg/dL (ref <150)
VLDL: 27 mg/dL (ref 0–40)

## 2009-06-29 ENCOUNTER — Ambulatory Visit: Payer: Self-pay | Admitting: Family Medicine

## 2009-06-29 LAB — CONVERTED CEMR LAB: Glucose, Bld: 120 mg/dL

## 2009-07-06 ENCOUNTER — Telehealth (INDEPENDENT_AMBULATORY_CARE_PROVIDER_SITE_OTHER): Payer: Self-pay | Admitting: *Deleted

## 2009-07-07 IMAGING — CR DG CHEST 2V
2 series · 2 of 2 positions shown · non-contrast
Comparison: 08/14/2007

CLINICAL DATA: 76-year-old female shortness of breath, cough,
congestion

CHEST - 2 VIEW

[view not recorded (1 of 2)]
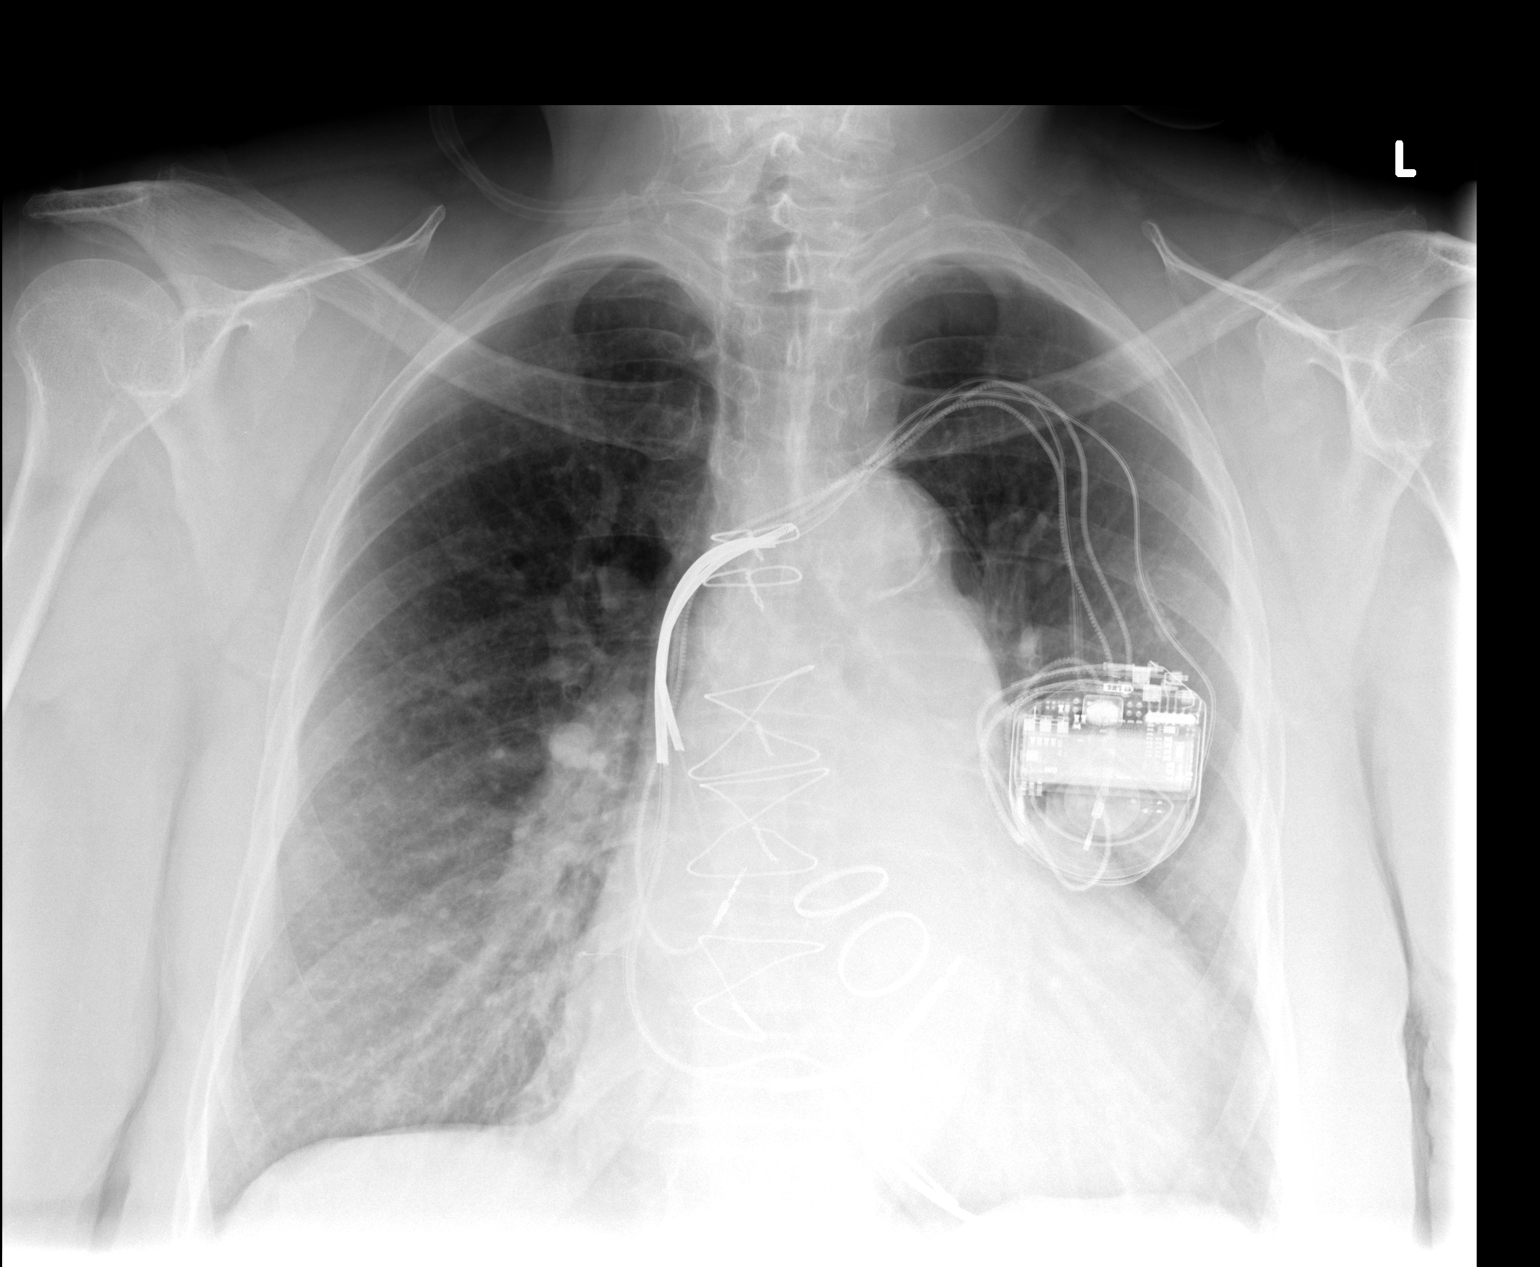

[view not recorded (2 of 2)]
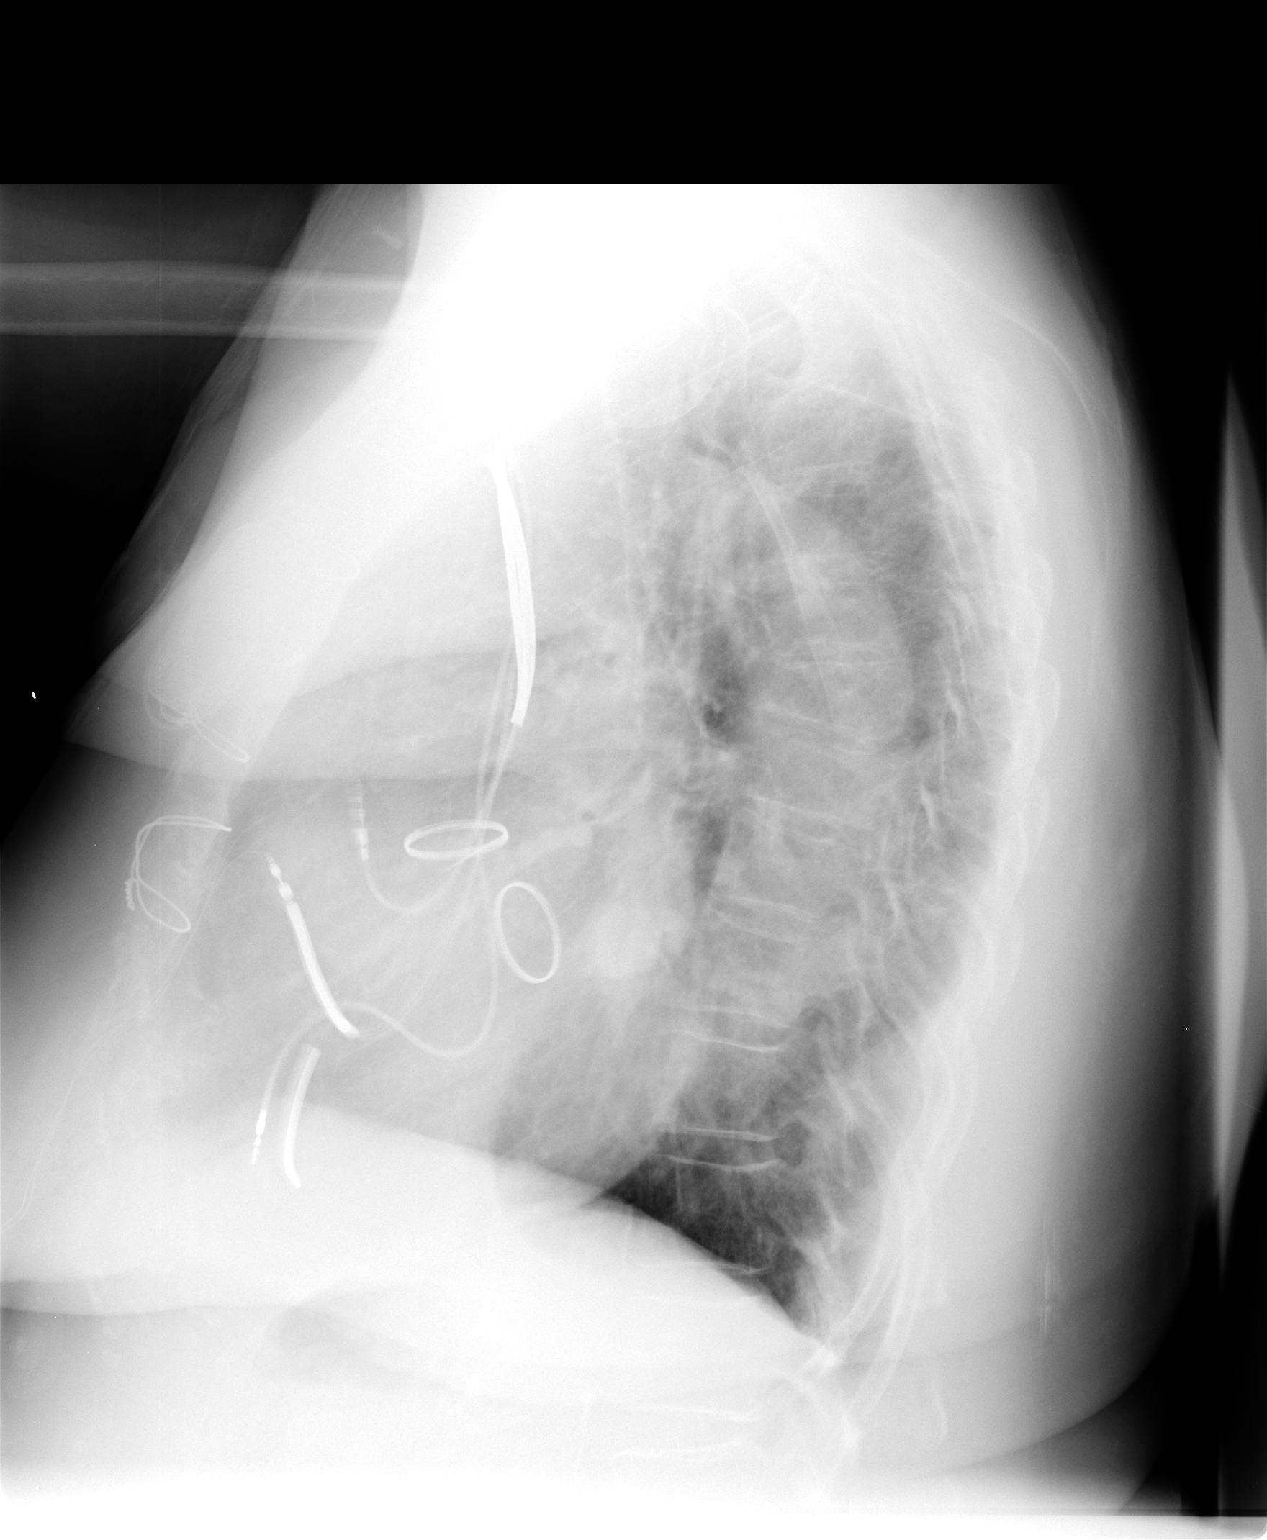

[2 of 2 positions shown; findings below may reference images not displayed]

FINDINGS: The patient is status post aortic and mitral valve replacements.
Left subclavian defibrillator is noted.  Cardiac silhouette remains
enlarged with vascular congestion.  No definite CHF, consolidation,
pneumonia, effusion or pneumothorax.  Trachea is midline.
Atherosclerosis of the aorta.  Stable mild compression of a mid
thoracic vertebral body.
IMPRESSION: Stable postoperative changes.

Cardiomegaly and vascular congestion.

Negative for acute CHF or definite pneumonia.

## 2009-07-08 ENCOUNTER — Telehealth: Payer: Self-pay | Admitting: Family Medicine

## 2009-07-16 ENCOUNTER — Telehealth: Payer: Self-pay | Admitting: Family Medicine

## 2009-07-18 ENCOUNTER — Encounter: Payer: Self-pay | Admitting: Family Medicine

## 2009-07-18 ENCOUNTER — Emergency Department (HOSPITAL_COMMUNITY): Admission: EM | Admit: 2009-07-18 | Discharge: 2009-07-18 | Payer: Self-pay | Admitting: Emergency Medicine

## 2009-07-20 ENCOUNTER — Telehealth: Payer: Self-pay | Admitting: Family Medicine

## 2009-07-26 ENCOUNTER — Telehealth: Payer: Self-pay | Admitting: Family Medicine

## 2009-07-27 ENCOUNTER — Encounter: Payer: Self-pay | Admitting: Family Medicine

## 2009-07-29 ENCOUNTER — Ambulatory Visit (HOSPITAL_COMMUNITY): Admission: RE | Admit: 2009-07-29 | Discharge: 2009-07-29 | Payer: Self-pay | Admitting: Family Medicine

## 2009-08-02 ENCOUNTER — Encounter (HOSPITAL_COMMUNITY): Admission: RE | Admit: 2009-08-02 | Discharge: 2009-09-01 | Payer: Self-pay | Admitting: Oncology

## 2009-08-02 ENCOUNTER — Ambulatory Visit (HOSPITAL_COMMUNITY): Payer: Self-pay | Admitting: Oncology

## 2009-08-02 ENCOUNTER — Ambulatory Visit: Payer: Self-pay | Admitting: Family Medicine

## 2009-08-02 LAB — CONVERTED CEMR LAB
Creatinine, Urine: 58.1 mg/dL
Glucose, Bld: 93 mg/dL
Hgb A1c MFr Bld: 6.5 %
Microalb Creat Ratio: 18.6 mg/g (ref 0.0–30.0)
Microalb, Ur: 1.08 mg/dL (ref 0.00–1.89)

## 2009-08-04 ENCOUNTER — Telehealth: Payer: Self-pay | Admitting: Family Medicine

## 2009-08-09 ENCOUNTER — Encounter: Payer: Self-pay | Admitting: Family Medicine

## 2009-08-10 ENCOUNTER — Encounter: Payer: Self-pay | Admitting: Family Medicine

## 2009-08-27 ENCOUNTER — Encounter: Payer: Self-pay | Admitting: Family Medicine

## 2009-09-06 ENCOUNTER — Telehealth: Payer: Self-pay | Admitting: Family Medicine

## 2009-09-29 LAB — CONVERTED CEMR LAB
ALT: 9 units/L (ref 0–35)
AST: 19 units/L (ref 0–37)
Albumin: 4.1 g/dL (ref 3.5–5.2)
Alkaline Phosphatase: 33 units/L — ABNORMAL LOW (ref 39–117)
BUN: 36 mg/dL — ABNORMAL HIGH (ref 6–23)
Bilirubin, Direct: 0.1 mg/dL (ref 0.0–0.3)
CO2: 26 meq/L (ref 19–32)
Calcium: 10.1 mg/dL (ref 8.4–10.5)
Chloride: 100 meq/L (ref 96–112)
Cholesterol: 114 mg/dL (ref 0–200)
Creatinine, Ser: 1.55 mg/dL — ABNORMAL HIGH (ref 0.40–1.20)
Glucose, Bld: 49 mg/dL — ABNORMAL LOW (ref 70–99)
HDL: 31 mg/dL — ABNORMAL LOW (ref >39)
Indirect Bilirubin: 0.3 mg/dL (ref 0.0–0.9)
LDL Cholesterol: 56 mg/dL (ref 0–99)
Potassium: 4.4 meq/L (ref 3.5–5.3)
Sodium: 140 meq/L (ref 135–145)
TSH: 1.946 microintl units/mL (ref 0.350–4.500)
Total Bilirubin: 0.4 mg/dL (ref 0.3–1.2)
Total CHOL/HDL Ratio: 3.7
Total Protein: 7.4 g/dL (ref 6.0–8.3)
Triglycerides: 133 mg/dL (ref <150)
VLDL: 27 mg/dL (ref 0–40)

## 2009-10-07 ENCOUNTER — Ambulatory Visit: Payer: Self-pay | Admitting: Family Medicine

## 2009-10-07 LAB — CONVERTED CEMR LAB: Glucose, Bld: 122 mg/dL

## 2009-11-03 ENCOUNTER — Telehealth: Payer: Self-pay | Admitting: Family Medicine

## 2009-11-24 ENCOUNTER — Encounter: Payer: Self-pay | Admitting: Family Medicine

## 2009-11-29 ENCOUNTER — Encounter: Payer: Self-pay | Admitting: Family Medicine

## 2009-12-01 ENCOUNTER — Encounter: Payer: Self-pay | Admitting: Family Medicine

## 2009-12-05 ENCOUNTER — Encounter: Payer: Self-pay | Admitting: Family Medicine

## 2009-12-08 ENCOUNTER — Encounter: Payer: Self-pay | Admitting: Family Medicine

## 2009-12-08 ENCOUNTER — Telehealth: Payer: Self-pay | Admitting: Family Medicine

## 2009-12-09 ENCOUNTER — Encounter: Payer: Self-pay | Admitting: Family Medicine

## 2009-12-17 ENCOUNTER — Encounter: Payer: Self-pay | Admitting: Family Medicine

## 2009-12-17 ENCOUNTER — Telehealth: Payer: Self-pay | Admitting: Family Medicine

## 2009-12-23 ENCOUNTER — Encounter: Payer: Self-pay | Admitting: Family Medicine

## 2009-12-23 DIAGNOSIS — I2510 Atherosclerotic heart disease of native coronary artery without angina pectoris: Secondary | ICD-10-CM | POA: Insufficient documentation

## 2009-12-27 ENCOUNTER — Telehealth: Payer: Self-pay | Admitting: Family Medicine

## 2009-12-30 LAB — CONVERTED CEMR LAB
BUN: 32 mg/dL — ABNORMAL HIGH (ref 6–23)
CO2: 27 meq/L (ref 19–32)
Calcium: 9.6 mg/dL (ref 8.4–10.5)
Chloride: 98 meq/L (ref 96–112)
Creatinine, Ser: 1.87 mg/dL — ABNORMAL HIGH (ref 0.40–1.20)
Glucose, Bld: 109 mg/dL — ABNORMAL HIGH (ref 70–99)
Hgb A1c MFr Bld: 6.7 % — ABNORMAL HIGH (ref 4.6–6.1)
Potassium: 4.5 meq/L (ref 3.5–5.3)
Pro B Natriuretic peptide (BNP): 124.7 pg/mL — ABNORMAL HIGH (ref 0.0–100.0)
Sodium: 139 meq/L (ref 135–145)
TSH: 2.24 microintl units/mL (ref 0.350–4.500)

## 2010-01-10 ENCOUNTER — Ambulatory Visit: Payer: Self-pay | Admitting: Family Medicine

## 2010-01-10 DIAGNOSIS — N6459 Other signs and symptoms in breast: Secondary | ICD-10-CM | POA: Insufficient documentation

## 2010-01-12 ENCOUNTER — Encounter: Payer: Self-pay | Admitting: Family Medicine

## 2010-01-13 ENCOUNTER — Ambulatory Visit (HOSPITAL_COMMUNITY): Admission: RE | Admit: 2010-01-13 | Discharge: 2010-01-13 | Payer: Self-pay | Admitting: Ophthalmology

## 2010-02-01 ENCOUNTER — Telehealth: Payer: Self-pay | Admitting: Family Medicine

## 2010-02-02 ENCOUNTER — Ambulatory Visit (HOSPITAL_COMMUNITY): Admission: RE | Admit: 2010-02-02 | Discharge: 2010-02-02 | Payer: Self-pay | Admitting: Family Medicine

## 2010-02-02 ENCOUNTER — Encounter: Payer: Self-pay | Admitting: Family Medicine

## 2010-02-08 ENCOUNTER — Ambulatory Visit (HOSPITAL_COMMUNITY): Admission: RE | Admit: 2010-02-08 | Discharge: 2010-02-08 | Payer: Self-pay | Admitting: Family Medicine

## 2010-02-16 ENCOUNTER — Encounter: Payer: Self-pay | Admitting: Family Medicine

## 2010-02-22 LAB — CONVERTED CEMR LAB
ALT: 12 units/L (ref 0–35)
AST: 23 units/L (ref 0–37)
Albumin: 4.2 g/dL (ref 3.5–5.2)
Alkaline Phosphatase: 31 units/L — ABNORMAL LOW (ref 39–117)
BUN: 35 mg/dL — ABNORMAL HIGH (ref 6–23)
Bilirubin, Direct: 0.1 mg/dL (ref 0.0–0.3)
CO2: 26 meq/L (ref 19–32)
Calcium: 9.7 mg/dL (ref 8.4–10.5)
Chloride: 98 meq/L (ref 96–112)
Cholesterol: 101 mg/dL (ref 0–200)
Creatinine, Ser: 1.86 mg/dL — ABNORMAL HIGH (ref 0.40–1.20)
Glucose, Bld: 116 mg/dL — ABNORMAL HIGH (ref 70–99)
HDL: 30 mg/dL — ABNORMAL LOW (ref >39)
Indirect Bilirubin: 0.2 mg/dL (ref 0.0–0.9)
LDL Cholesterol: 40 mg/dL (ref 0–99)
Potassium: 4.5 meq/L (ref 3.5–5.3)
Sodium: 140 meq/L (ref 135–145)
TSH: 2.178 microintl units/mL (ref 0.350–4.500)
Total Bilirubin: 0.3 mg/dL (ref 0.3–1.2)
Total CHOL/HDL Ratio: 3.4
Total Protein: 7.5 g/dL (ref 6.0–8.3)
Triglycerides: 155 mg/dL — ABNORMAL HIGH (ref <150)
VLDL: 31 mg/dL (ref 0–40)
Vit D, 25-Hydroxy: 40 ng/mL (ref 30–89)

## 2010-02-23 ENCOUNTER — Telehealth: Payer: Self-pay | Admitting: Family Medicine

## 2010-03-03 ENCOUNTER — Encounter: Payer: Self-pay | Admitting: Family Medicine

## 2010-03-03 ENCOUNTER — Telehealth: Payer: Self-pay | Admitting: Family Medicine

## 2010-03-04 IMAGING — CT CT L SPINE W/O CM
3 of 9 series · 12 of 33 positions shown, 14 images · non-contrast
Comparison: CT 01/27/2008.

CLINICAL DATA: Low back pain and right hip pain.  (Defibrillator
patient).  No history of prior lumbar surgery.

CT LUMBAR SPINE WITHOUT CONTRAST
TECHNIQUE: Multidetector CT imaging of the lumbar spine was
performed without intravenous contrast administration. Multiplanar
CT image reconstructions were also generated.

[Series 2: lumbar spine 3.0 b30s · axial · 0.32mm/px · z∈[-260,-110]mm · 4 of 76 slices shown, 5 images]
[im 13/76  soft-tissue]
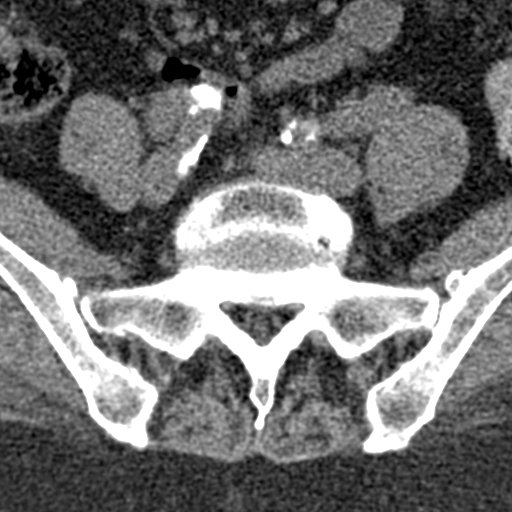
[im 13/76  bone]
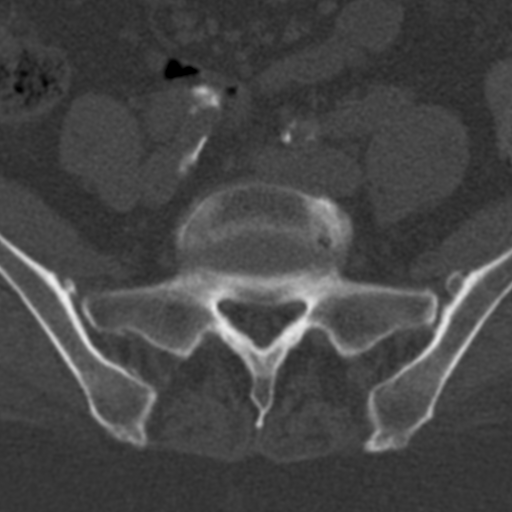
[im 26/76  bone]
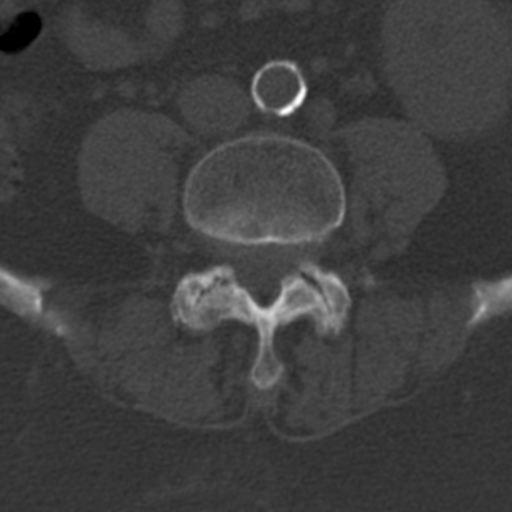
[im 51/76  bone]
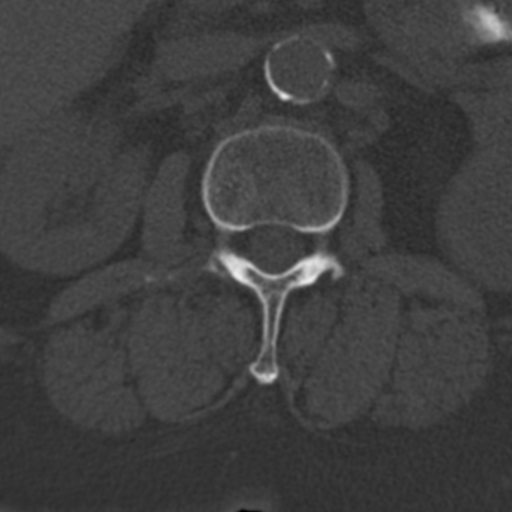
[im 63/76  bone]
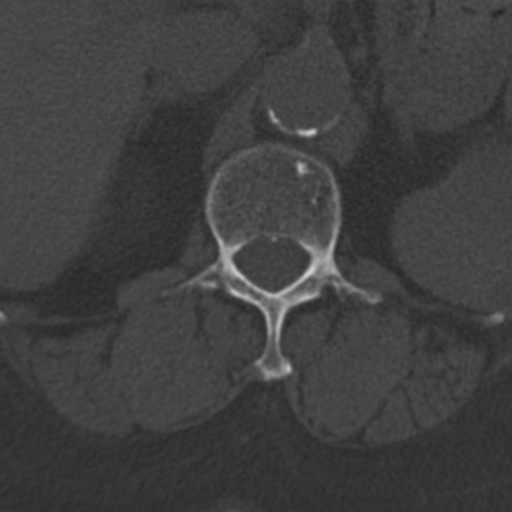

[Series 4: lumbar spine 3.0 spo · coronal · 0.30mm/px · 3 of 46 slices shown]
[im 10/46  bone]
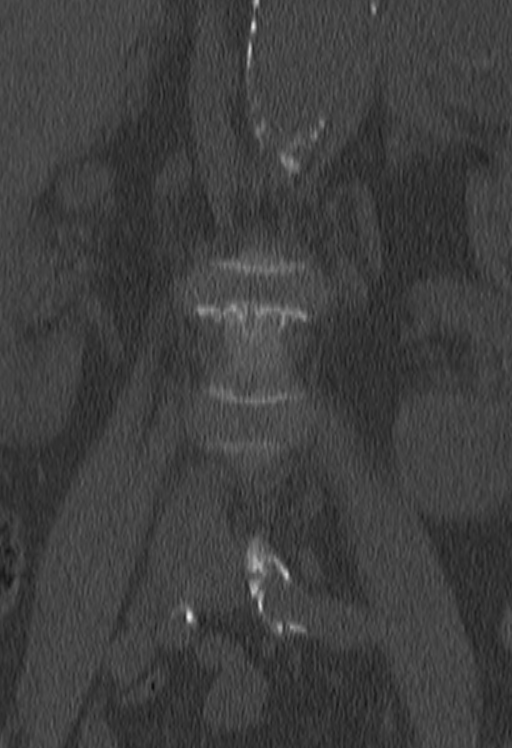
[im 19/46  bone]
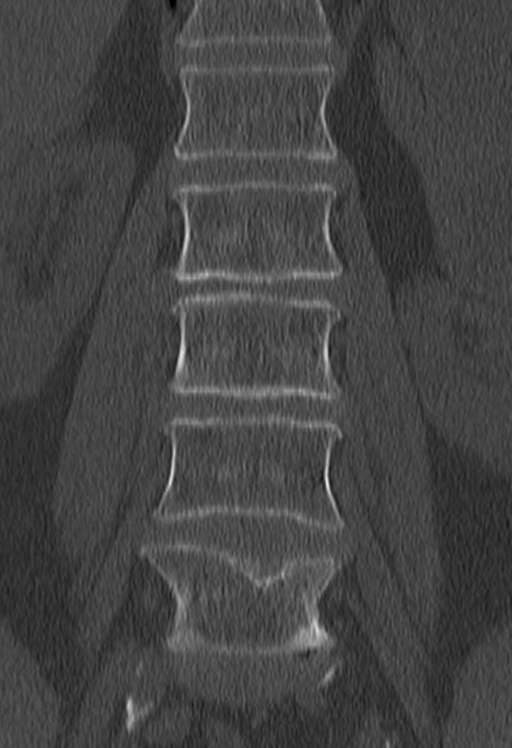
[im 28/46  bone]
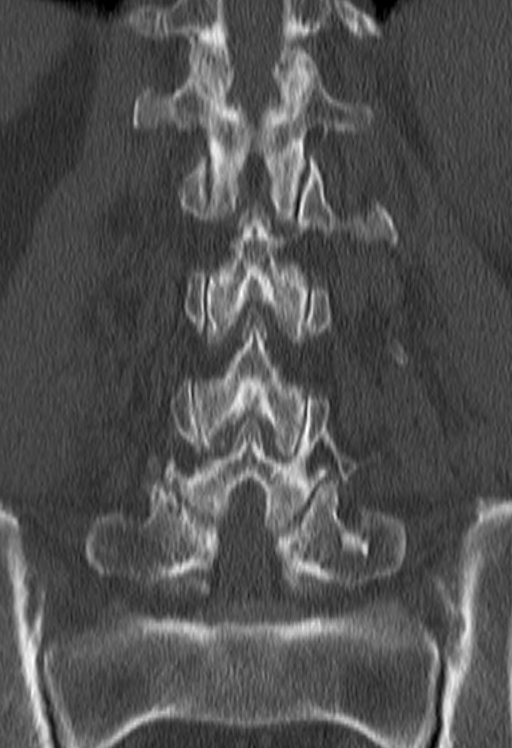

[Series 5: lumbar spine 3.0 spo sag · sagittal · 0.30mm/px · 5 of 54 slices shown, 6 images]
[im 18/54  bone]
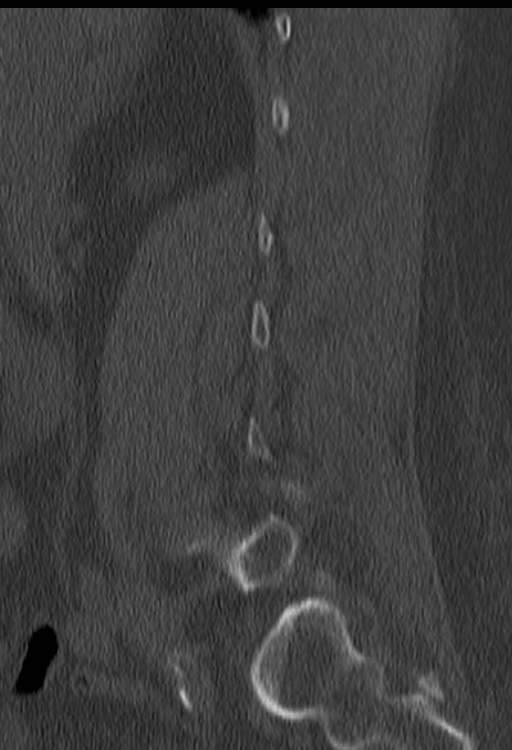
[im 23/54  bone]
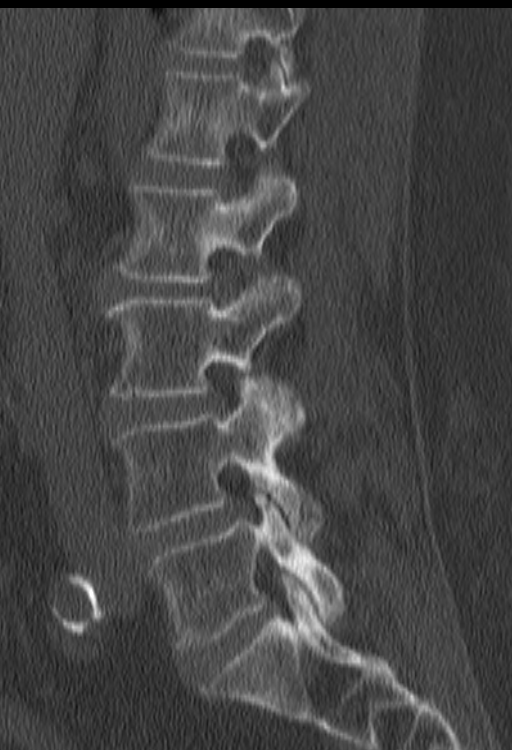
[im 27/54  soft-tissue]
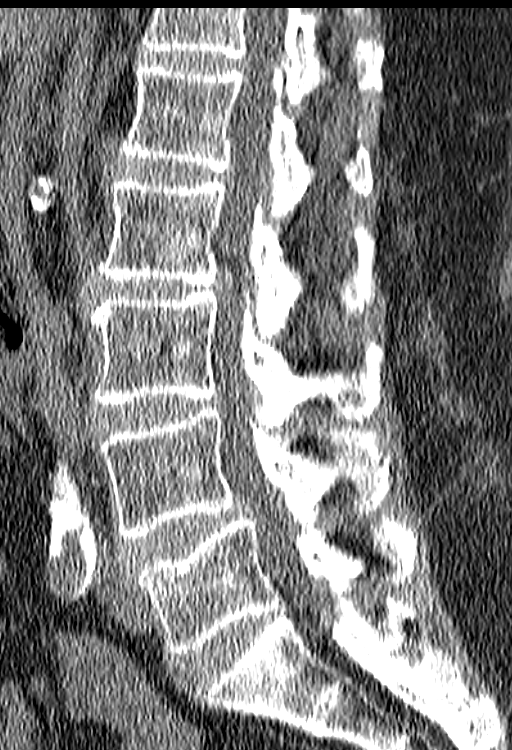
[im 27/54  bone]
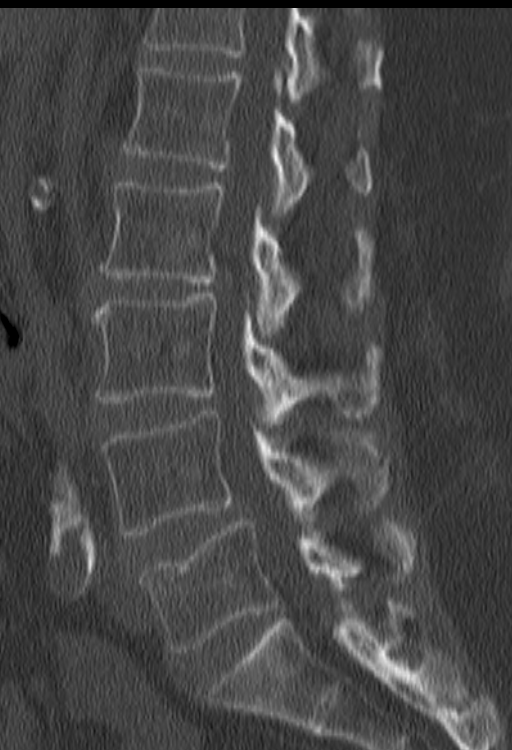
[im 31/54  bone]
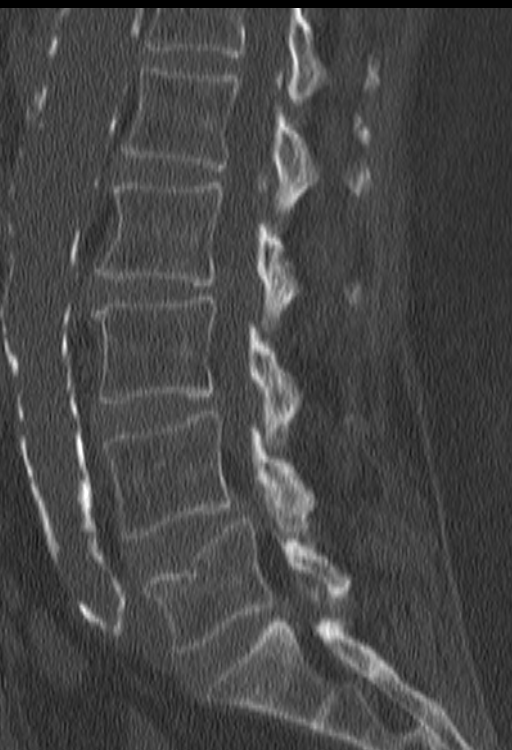
[im 36/54  bone]
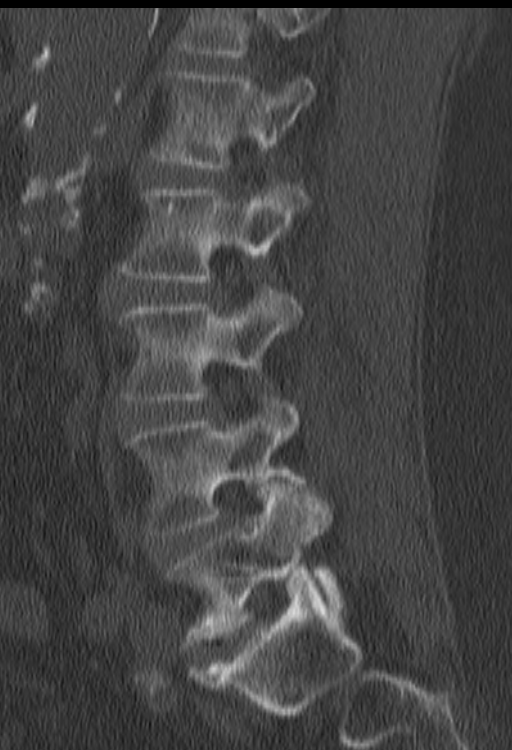

[12 of 33 positions shown; findings below may reference images not displayed]

FINDINGS: Abdominal aortic and visceral atherosclerosis without
aneurysm. Partially visualized peripherally calcified left splenic
lesions seen on prior CT again noted.  Left inferior pole renal
cystic lesion partially visualized as well.  Five lumbar type
vertebral bodies are identified.  Grade 1 anterolisthesis of L4 on
L5.  There is loss of L5 vertebral body height, asymmetric on the
left with an associated Schmorl's node.  The appearance is
consistent with compression fracture with approximately 25% loss of
vertebral body height.

T12-L1:  Mild bilateral facet arthrosis.  No stenosis.

L1-L2: Benign bone island in the inferior L2 vertebra.  Broad-based
posterior disc bulge which appears shallow, with minimal central
stenosis.  Bulging extends into the foramina without significant
neural compression.  Mild bilateral facet arthrosis.

L2-L3:  Bilateral facet arthrosis which is mild to moderate.  There
is loss of disc height with circumferential disc bulge.
Posteriorly, this disc protrusion is broad-based and moderate in
size.  This produces central stenosis which is probably moderate.
Moderate right foraminal stenosis and mild left foraminal stenosis.

L3-L4:  Mild loss of disc height.  Broad-based posterior disc
protrusion is present.  Mild central stenosis.  Moderate bilateral
facet hypertrophy and facet arthrosis.  Mild bilateral symmetric
foraminal narrowing.  No neural compression.  Probable right
lateral recess stenosis associated with facet hypertrophy and disc
bulge.

L4-L5:  Severe bilateral facet arthrosis accounting for grade 1
anterolisthesis.  Uncoverage of the disc.  Mild bilateral foraminal
narrowing associated with slip, bulging disc, and facet disease.
Probable bilateral subarticular lateral recess stenosis associated
with facet hypertrophy.  Mild to moderate central stenosis is
multifactorial.

L5-S1:  Minimal disc bulge.  Neural foramina appear patent.  L5-S1
vacuum disc.

Bilateral SI joint degenerative disease.
IMPRESSION: 1.  L2-L3 multifactorial spinal stenosis with moderate right
foraminal stenosis which could potentially account for right hip
pain.
2.  Multilevel facet arthrosis, most severe at L4-L5 with grade 1
anterolisthesis.  Multifactorial L4-L5 spinal stenosis. Bilateral
L4-L5 foraminal narrowing associated with slip and disc.
3.  L3-L4 mild central stenosis and bilateral facet
hypertrophy/arthrosis.  Probable right lateral recess stenosis
associated with facet disease.
4.  Chronic L5 vertebral body compression fracture of the superior
endplate with associated Schmorl's node (present on prior exam of

## 2010-03-21 ENCOUNTER — Ambulatory Visit: Payer: Self-pay | Admitting: Family Medicine

## 2010-03-22 ENCOUNTER — Encounter: Payer: Self-pay | Admitting: Family Medicine

## 2010-03-28 LAB — CONVERTED CEMR LAB
Bilirubin Urine: NEGATIVE
Hemoglobin, Urine: NEGATIVE
Ketones, ur: NEGATIVE mg/dL
Leukocytes, UA: NEGATIVE
Nitrite: NEGATIVE
Protein, ur: NEGATIVE mg/dL
Specific Gravity, Urine: 1.01 (ref 1.005–1.030)
Urine Glucose: NEGATIVE mg/dL
Urobilinogen, UA: 0.2 (ref 0.0–1.0)
pH: 7 (ref 5.0–8.0)

## 2010-04-04 ENCOUNTER — Ambulatory Visit (HOSPITAL_COMMUNITY): Payer: Self-pay | Admitting: Oncology

## 2010-04-04 ENCOUNTER — Encounter (HOSPITAL_COMMUNITY): Admission: RE | Admit: 2010-04-04 | Discharge: 2010-05-04 | Payer: Self-pay | Admitting: Oncology

## 2010-04-04 ENCOUNTER — Encounter: Payer: Self-pay | Admitting: Family Medicine

## 2010-04-06 ENCOUNTER — Encounter: Payer: Self-pay | Admitting: Family Medicine

## 2010-04-06 ENCOUNTER — Telehealth: Payer: Self-pay | Admitting: Family Medicine

## 2010-04-08 LAB — CONVERTED CEMR LAB: Hgb A1c MFr Bld: 6.5 % — ABNORMAL HIGH (ref <5.7)

## 2010-04-14 ENCOUNTER — Encounter: Payer: Self-pay | Admitting: Family Medicine

## 2010-04-18 ENCOUNTER — Telehealth: Payer: Self-pay | Admitting: Family Medicine

## 2010-04-20 ENCOUNTER — Other Ambulatory Visit: Admission: RE | Admit: 2010-04-20 | Discharge: 2010-04-20 | Payer: Self-pay | Admitting: Obstetrics and Gynecology

## 2010-05-02 ENCOUNTER — Telehealth: Payer: Self-pay | Admitting: Family Medicine

## 2010-05-26 IMAGING — CR DG CHEST 2V
2 series · 2 of 2 positions shown · non-contrast
Comparison: 08/29/2008

CLINICAL DATA: Cough

CHEST - 2 VIEW

[view not recorded (1 of 2)]
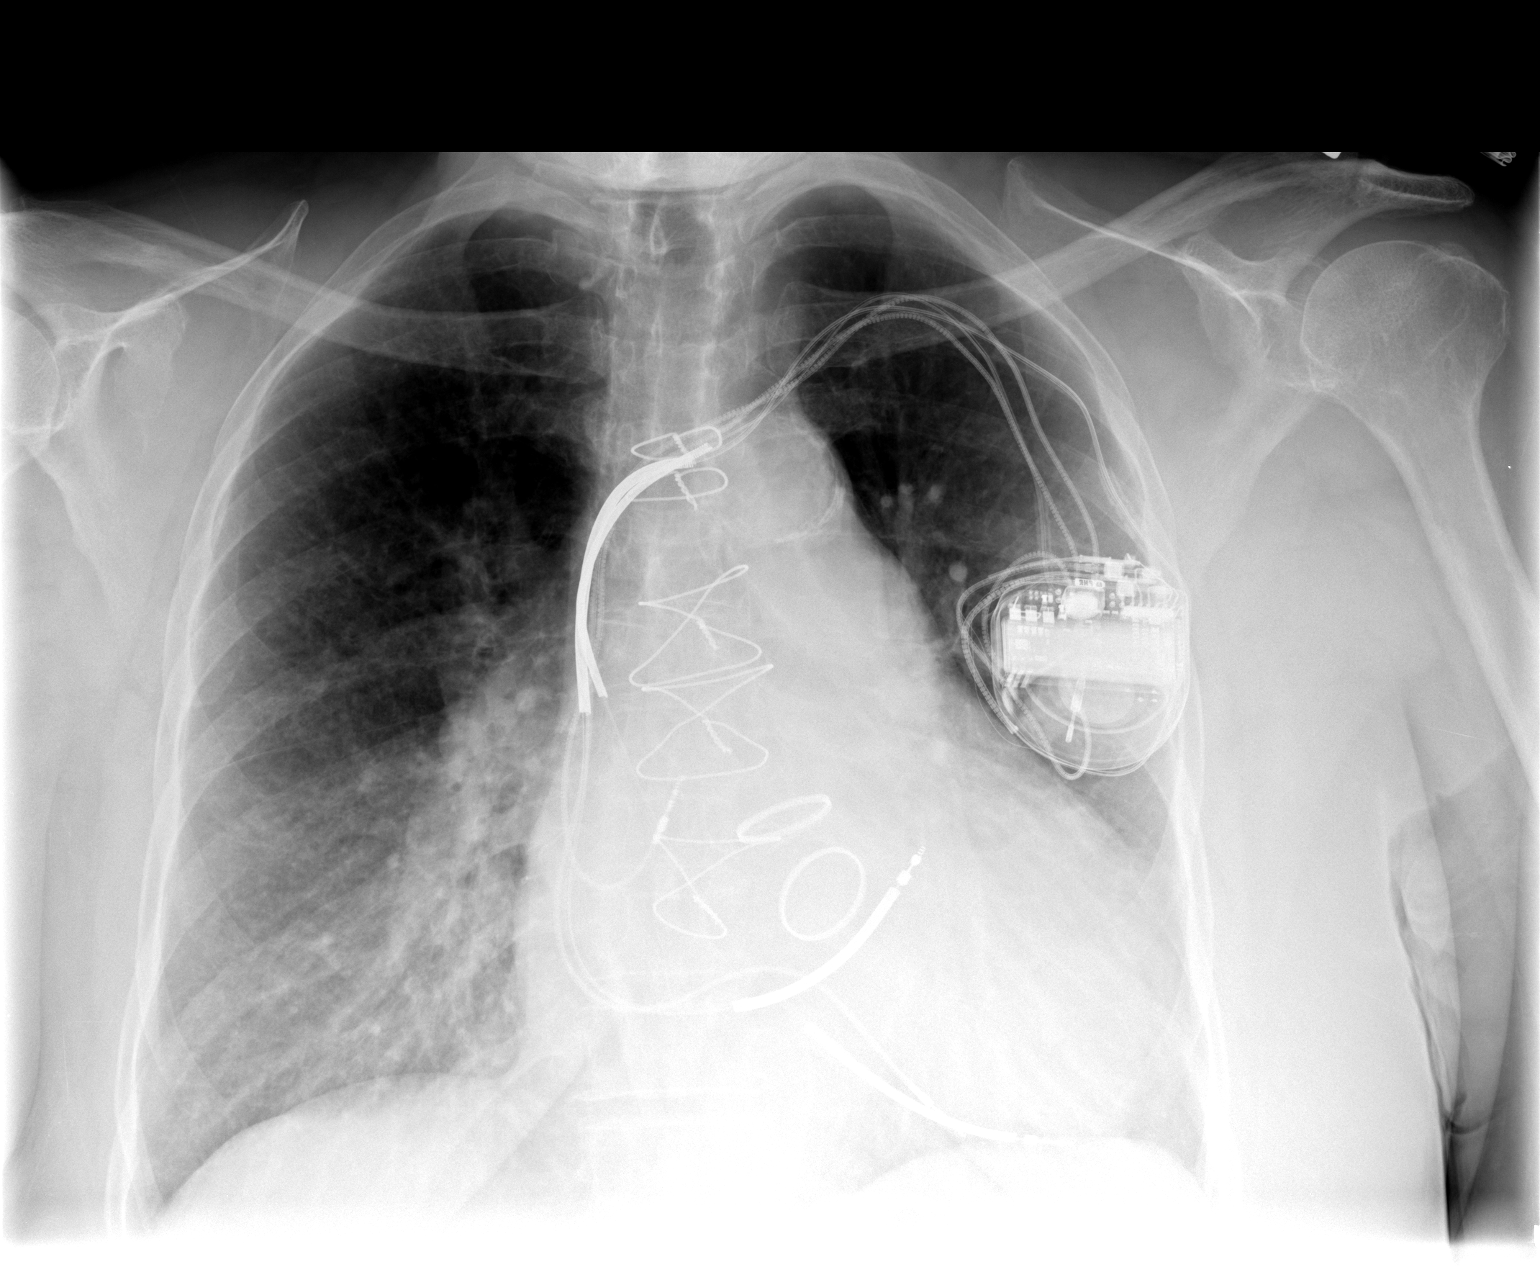

[view not recorded (2 of 2)]
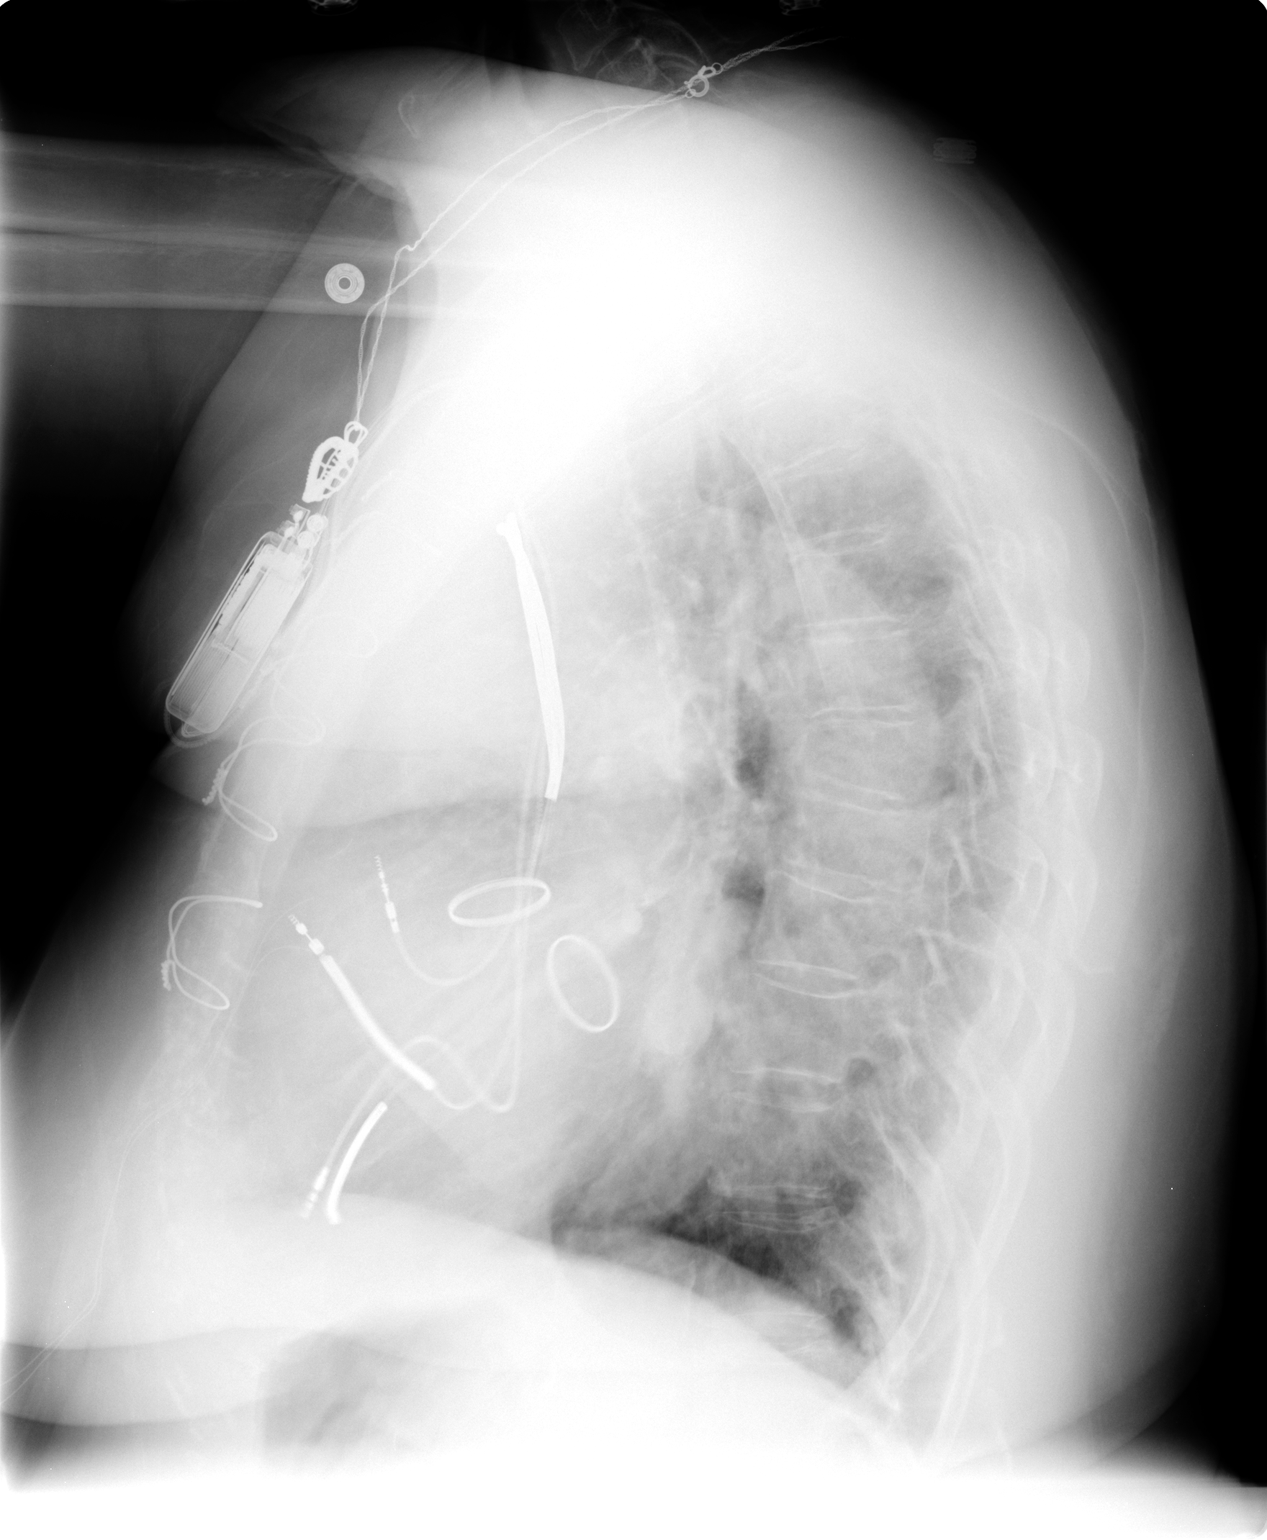

[2 of 2 positions shown; findings below may reference images not displayed]

FINDINGS: Previous median sternotomy and valve surgery.  Left
subclavian AICD stable.  Moderate cardiomegaly stable.  Mild
perihilar and bibasilar interstitial edema or infiltrates stable.
No   effusion.  Mild compression deformity in the mid thoracic
spine unchanged.
IMPRESSION: 1.  Mild perihilar and bibasilar interstitial infiltrates or edema
without significant change.
2.  Stable cardiomegaly and postop changes

## 2010-05-27 ENCOUNTER — Ambulatory Visit: Payer: Self-pay | Admitting: Family Medicine

## 2010-05-27 DIAGNOSIS — J019 Acute sinusitis, unspecified: Secondary | ICD-10-CM | POA: Insufficient documentation

## 2010-05-27 LAB — CONVERTED CEMR LAB
Bilirubin Urine: NEGATIVE
Blood in Urine, dipstick: NEGATIVE
Glucose, Urine, Semiquant: NEGATIVE
Ketones, urine, test strip: NEGATIVE
Nitrite: NEGATIVE
Protein, U semiquant: NEGATIVE
Specific Gravity, Urine: 1.015
Urobilinogen, UA: 0.2
WBC Urine, dipstick: NEGATIVE
pH: 7

## 2010-05-31 LAB — CONVERTED CEMR LAB
BUN: 35 mg/dL — ABNORMAL HIGH (ref 6–23)
Basophils Absolute: 0 10*3/uL (ref 0.0–0.1)
Basophils Relative: 0 % (ref 0–1)
CO2: 31 meq/L (ref 19–32)
Calcium: 10 mg/dL (ref 8.4–10.5)
Chloride: 95 meq/L — ABNORMAL LOW (ref 96–112)
Creatinine, Ser: 2.22 mg/dL — ABNORMAL HIGH (ref 0.40–1.20)
Eosinophils Absolute: 0 10*3/uL (ref 0.0–0.7)
Eosinophils Relative: 0 % (ref 0–5)
Glucose, Bld: 116 mg/dL — ABNORMAL HIGH (ref 70–99)
HCT: 31.2 % — ABNORMAL LOW (ref 36.0–46.0)
Hemoglobin: 10.2 g/dL — ABNORMAL LOW (ref 12.0–15.0)
Lymphocytes Relative: 15 % (ref 12–46)
Lymphs Abs: 1.3 10*3/uL (ref 0.7–4.0)
MCHC: 32.7 g/dL (ref 30.0–36.0)
MCV: 89.1 fL (ref 78.0–100.0)
Monocytes Absolute: 0.7 10*3/uL (ref 0.1–1.0)
Monocytes Relative: 8 % (ref 3–12)
Neutro Abs: 6.3 10*3/uL (ref 1.7–7.7)
Neutrophils Relative %: 77 % (ref 43–77)
Platelets: 299 10*3/uL (ref 150–400)
Potassium: 4.5 meq/L (ref 3.5–5.3)
RBC: 3.5 M/uL — ABNORMAL LOW (ref 3.87–5.11)
RDW: 15.3 % (ref 11.5–15.5)
Sodium: 137 meq/L (ref 135–145)
WBC: 8.3 10*3/uL (ref 4.0–10.5)

## 2010-06-01 ENCOUNTER — Encounter: Payer: Self-pay | Admitting: Family Medicine

## 2010-06-03 ENCOUNTER — Encounter: Payer: Self-pay | Admitting: Family Medicine

## 2010-06-03 ENCOUNTER — Telehealth: Payer: Self-pay | Admitting: Family Medicine

## 2010-06-07 ENCOUNTER — Ambulatory Visit: Payer: Self-pay | Admitting: Family Medicine

## 2010-06-07 DIAGNOSIS — R110 Nausea: Secondary | ICD-10-CM | POA: Insufficient documentation

## 2010-06-07 HISTORY — DX: Nausea: R11.0

## 2010-06-07 LAB — CONVERTED CEMR LAB
BUN: 42 mg/dL — ABNORMAL HIGH (ref 6–23)
CO2: 31 meq/L (ref 19–32)
Calcium: 9.9 mg/dL (ref 8.4–10.5)
Chloride: 95 meq/L — ABNORMAL LOW (ref 96–112)
Creatinine, Ser: 2.61 mg/dL — ABNORMAL HIGH (ref 0.40–1.20)
Glucose, Bld: 75 mg/dL (ref 70–99)
Potassium: 5.1 meq/L (ref 3.5–5.3)
Sodium: 132 meq/L — ABNORMAL LOW (ref 135–145)
TSH: 1.864 microintl units/mL (ref 0.350–4.500)

## 2010-06-08 ENCOUNTER — Inpatient Hospital Stay (HOSPITAL_COMMUNITY): Admission: AD | Admit: 2010-06-08 | Discharge: 2010-06-12 | Payer: Self-pay | Source: Home / Self Care

## 2010-06-08 ENCOUNTER — Ambulatory Visit: Payer: Self-pay | Admitting: Cardiology

## 2010-06-09 ENCOUNTER — Encounter (INDEPENDENT_AMBULATORY_CARE_PROVIDER_SITE_OTHER): Payer: Self-pay | Admitting: Internal Medicine

## 2010-06-12 DIAGNOSIS — E860 Dehydration: Secondary | ICD-10-CM | POA: Insufficient documentation

## 2010-06-23 ENCOUNTER — Encounter: Payer: Self-pay | Admitting: Family Medicine

## 2010-06-27 ENCOUNTER — Telehealth: Payer: Self-pay | Admitting: Family Medicine

## 2010-06-28 ENCOUNTER — Telehealth: Payer: Self-pay | Admitting: Family Medicine

## 2010-07-07 ENCOUNTER — Encounter: Payer: Self-pay | Admitting: Family Medicine

## 2010-07-15 DIAGNOSIS — I495 Sick sinus syndrome: Secondary | ICD-10-CM | POA: Insufficient documentation

## 2010-07-15 DIAGNOSIS — E1165 Type 2 diabetes mellitus with hyperglycemia: Secondary | ICD-10-CM | POA: Insufficient documentation

## 2010-07-15 DIAGNOSIS — E11649 Type 2 diabetes mellitus with hypoglycemia without coma: Secondary | ICD-10-CM | POA: Insufficient documentation

## 2010-07-15 DIAGNOSIS — I059 Rheumatic mitral valve disease, unspecified: Secondary | ICD-10-CM | POA: Insufficient documentation

## 2010-07-15 DIAGNOSIS — F329 Major depressive disorder, single episode, unspecified: Secondary | ICD-10-CM | POA: Insufficient documentation

## 2010-07-15 DIAGNOSIS — E039 Hypothyroidism, unspecified: Secondary | ICD-10-CM | POA: Insufficient documentation

## 2010-07-15 HISTORY — DX: Type 2 diabetes mellitus with hyperglycemia: E11.65

## 2010-07-18 ENCOUNTER — Ambulatory Visit: Payer: Self-pay | Admitting: Family Medicine

## 2010-07-18 DIAGNOSIS — G4700 Insomnia, unspecified: Secondary | ICD-10-CM | POA: Insufficient documentation

## 2010-07-21 ENCOUNTER — Encounter: Payer: Self-pay | Admitting: Family Medicine

## 2010-07-21 ENCOUNTER — Telehealth: Payer: Self-pay | Admitting: Family Medicine

## 2010-07-21 LAB — CONVERTED CEMR LAB
ALT: 10 units/L (ref 0–35)
AST: 21 units/L (ref 0–37)
Albumin: 4.4 g/dL (ref 3.5–5.2)
Alkaline Phosphatase: 28 units/L — ABNORMAL LOW (ref 39–117)
BUN: 34 mg/dL — ABNORMAL HIGH (ref 6–23)
CO2: 28 meq/L (ref 19–32)
Calcium: 10.6 mg/dL — ABNORMAL HIGH (ref 8.4–10.5)
Chloride: 99 meq/L (ref 96–112)
Creatinine, Ser: 1.86 mg/dL — ABNORMAL HIGH (ref 0.40–1.20)
Glucose, Bld: 114 mg/dL — ABNORMAL HIGH (ref 70–99)
HCT: 34.4 % — ABNORMAL LOW (ref 36.0–46.0)
Hemoglobin: 10.9 g/dL — ABNORMAL LOW (ref 12.0–15.0)
Hgb A1c MFr Bld: 6.3 % — ABNORMAL HIGH (ref <5.7)
Potassium: 4.5 meq/L (ref 3.5–5.3)
Sodium: 137 meq/L (ref 135–145)
Total Bilirubin: 0.5 mg/dL (ref 0.3–1.2)
Total Protein: 7.9 g/dL (ref 6.0–8.3)

## 2010-07-22 ENCOUNTER — Telehealth: Payer: Self-pay | Admitting: Family Medicine

## 2010-07-22 ENCOUNTER — Encounter: Payer: Self-pay | Admitting: Family Medicine

## 2010-07-25 ENCOUNTER — Encounter: Payer: Self-pay | Admitting: Family Medicine

## 2010-07-25 ENCOUNTER — Telehealth: Payer: Self-pay | Admitting: Family Medicine

## 2010-07-25 ENCOUNTER — Telehealth (INDEPENDENT_AMBULATORY_CARE_PROVIDER_SITE_OTHER): Payer: Self-pay | Admitting: *Deleted

## 2010-08-04 ENCOUNTER — Telehealth: Payer: Self-pay | Admitting: Family Medicine

## 2010-08-15 ENCOUNTER — Telehealth: Payer: Self-pay | Admitting: Family Medicine

## 2010-08-15 ENCOUNTER — Telehealth (INDEPENDENT_AMBULATORY_CARE_PROVIDER_SITE_OTHER): Payer: Self-pay | Admitting: *Deleted

## 2010-08-17 ENCOUNTER — Encounter: Payer: Self-pay | Admitting: Family Medicine

## 2010-08-17 ENCOUNTER — Encounter: Admission: RE | Admit: 2010-08-17 | Discharge: 2010-08-17 | Payer: Self-pay | Admitting: Surgery

## 2010-08-22 ENCOUNTER — Telehealth: Payer: Self-pay | Admitting: Family Medicine

## 2010-08-25 ENCOUNTER — Encounter: Payer: Self-pay | Admitting: Family Medicine

## 2010-09-02 ENCOUNTER — Encounter: Payer: Self-pay | Admitting: Family Medicine

## 2010-09-20 ENCOUNTER — Encounter: Payer: Self-pay | Admitting: Family Medicine

## 2010-09-20 ENCOUNTER — Encounter (HOSPITAL_COMMUNITY)
Admission: RE | Admit: 2010-09-20 | Discharge: 2010-10-20 | Payer: Self-pay | Source: Home / Self Care | Attending: Oncology | Admitting: Oncology

## 2010-09-20 ENCOUNTER — Ambulatory Visit (HOSPITAL_COMMUNITY): Payer: Self-pay | Admitting: Oncology

## 2010-09-20 LAB — CONVERTED CEMR LAB
BUN: 39 mg/dL — ABNORMAL HIGH (ref 6–23)
CO2: 29 meq/L (ref 19–32)
Calcium: 9.9 mg/dL (ref 8.4–10.5)
Chloride: 103 meq/L (ref 96–112)
Creatinine, Ser: 1.9 mg/dL — ABNORMAL HIGH (ref 0.40–1.20)
Glucose, Bld: 119 mg/dL — ABNORMAL HIGH (ref 70–99)
Hgb A1c MFr Bld: 6.9 % — ABNORMAL HIGH (ref <5.7)
Potassium: 4.6 meq/L (ref 3.5–5.3)
Sodium: 142 meq/L (ref 135–145)
TSH: 1.432 microintl units/mL (ref 0.350–4.500)

## 2010-09-22 ENCOUNTER — Encounter: Payer: Self-pay | Admitting: Family Medicine

## 2010-09-25 HISTORY — PX: DOPPLER ECHOCARDIOGRAPHY: SHX263

## 2010-09-28 ENCOUNTER — Encounter: Payer: Self-pay | Admitting: Family Medicine

## 2010-10-15 ENCOUNTER — Encounter: Payer: Self-pay | Admitting: Internal Medicine

## 2010-10-16 ENCOUNTER — Encounter: Payer: Self-pay | Admitting: Family Medicine

## 2010-10-16 ENCOUNTER — Encounter: Payer: Self-pay | Admitting: Orthopedic Surgery

## 2010-10-18 ENCOUNTER — Ambulatory Visit
Admission: RE | Admit: 2010-10-18 | Discharge: 2010-10-18 | Payer: Self-pay | Source: Home / Self Care | Attending: Family Medicine | Admitting: Family Medicine

## 2010-10-18 DIAGNOSIS — R51 Headache: Secondary | ICD-10-CM | POA: Insufficient documentation

## 2010-10-18 DIAGNOSIS — R519 Headache, unspecified: Secondary | ICD-10-CM | POA: Insufficient documentation

## 2010-10-25 NOTE — Progress Notes (Signed)
Summary: oxygen  Phone Note Call from Patient   Summary of Call: needs her oxygen tank like leon  send to [place] [facility] Initial call taken by: Dierdre Harness,  July 25, 2010 1:23 PM  Follow-up for Phone Call        pls let pt know order will be sent  Follow-up by: [name] MD,  July 25, 2010 6:21 PM  Additional Follow-up for Phone Call Additional follow up Details #1::        noted Additional Follow-up by: Baldomero Lamy LPN,  November  1, 624THL 1:55 PM

## 2010-10-25 NOTE — Assessment & Plan Note (Signed)
Summary: office visit   Vital Signs:  Patient profile:   77 year old female Menstrual status:  postmenopausal Height:      66.5 inches Weight:      256.75 pounds BMI:     40.97 O2 Sat:      93 % on Room air Pulse rate:   83 / minute Pulse rhythm:   regular Resp:     16 per minute BP sitting:   120 / 50  (left arm)  Vitals Entered By: Jimmey Ralph LPN (January 13, 624THL 2:35 PM)  Nutrition Counseling: Patient's BMI is greater than 25 and therefore counseled on weight management options.  O2 Flow:  Room air CC: follow-up visit- weak, no energy, legs and arms sore at night Is Patient Diabetic? Yes Did you bring your meter with you today? No Pain Assessment Patient in pain? no        Primary Care Provider:  [name] MD  CC:  follow-up visit- weak, no energy, and legs and arms sore at night.  History of Present Illness: Pt c/o faTIGUE WHICH IS WORSENING , AS WELL AS SORENESSS I HER ARMS AND LEGS. sHE IS NOT EXERCISING , AND UNFORTUNATELY CONTINUES TO GAIN WEIGHT. sHE REPORTS DIETARY INDISCRETION ALSO. sHE DENIES FEVR OR CHILLS. sHE DENIES DEPRESSIO OR ANXIETY.  Allergies (verified): 1)  ! Pcn 2)  ! * Niaspan  Review of Systems      See HPI General:  Complains of fatigue. Eyes:  Denies blurring and discharge. ENT:  Denies earache, hoarseness, nasal congestion, nosebleeds, postnasal drainage, sinus pressure, and sore throat. CV:  Denies chest pain or discomfort, palpitations, and swelling of feet. Resp:  Denies cough, shortness of breath, and sputum productive. 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 GU:  Denies dysuria and urinary frequency. MS:  Complains of joint pain, low back pain, mid back pain, and stiffness; C/O INCREASED AND UNCONTROLLED JOINT PAINS. Derm:   Denies itching, lesion(s), and rash. Neuro:  Complains of poor balance; denies seizures and sensation of room spinning; PRIMARILY DUE TO mUSCULOSKELETAL PROBLEMS. Psych:  Complains of anxiety and depression; denies mental problems, panic attacks, sense of great danger, suicidal thoughts/plans, thoughts of violence, and unusual visions or sounds; CHRONIC AND MIL TO MODERATE. Endo:  Denies cold intolerance, excessive hunger, excessive thirst, excessive urination, heat intolerance, polyuria, and weight change. Heme:  Complains of abnormal bruising; denies bleeding; PT ON CHRONIC COUMADIN. Allergy:  Complains of seasonal allergies.  Physical Exam  General:  alert, well-hydrated, and overweight-appearing. HEENT: No facial asymmetry,  EOMI, No sinus tenderness, TM's Clear, oropharynx  pink and moist.   Chest: Clear to auscultation bilaterally.  CVS: S1, S2, systolic murmurs, No S3. positveclick.  Abd: Soft, Nontender.  [id number] ROM spine, hips, shoulders and knees.  Ext: No edema.   CNS: CN 2-12 intact, power tone and sensation normal throughout.   Skin: Intact, no visible lesions or rashes.  Psych: Good eye contact, normal affect.  Memory intact, not anxious or depressed appearing.      Impression & Recommendations:  Problem # 1:  HIP PAIN, RIGHT (ICD-719.45) Assessment Deteriorated  Her updated medication list for this problem includes:    Hydrocodone-acetaminophen 5-325 Mg Tabs (Hydrocodone-acetaminophen) ..... One tab by mouth once daily    Amrix 15 Mg Xr24h-cap (Cyclobenzaprine hcl) .Marland Kitchen... Take 1 tablet by mouth once a  day    Cyclobenzaprine Hcl 10 Mg Tabs (Cyclobenzaprine hcl) .Marland Kitchen... Take 1 tab by mouth at bedtime    Cyclobenzaprine Hcl 10 Mg Tabs (Cyclobenzaprine hcl) .Marland Kitchen... Take 1 tab by mouth at bedtime  as needed  Problem # 2:  CHOLELITHIASIS (ICD-574.20) Assessment: Unchanged  Problem # 3:  HYPERTENSION (ICD-401.9) Assessment: Unchanged  Her updated medication list for  this problem includes:    Lasix 40 Mg Tabs (Furosemide) ..... One tab by mouth once daily    Toprol Xl 100 Mg Tb24 (Metoprolol succinate) ..... One tab by mouth once daily    Losartan Potassium-hctz 50-12.5 Mg Tabs (Losartan potassium-hctz) .Marland Kitchen... Take 1 tablet by mouth once a day  BP today: 120/50 Prior BP: 123/73 (08/02/2009)  Labs Reviewed: K+: 4.4 (09/23/2009) Creat: : 1.55 (09/23/2009)   Chol: 114 (09/23/2009)   HDL: 31 (09/23/2009)   LDL: 56 (09/23/2009)   TG: 133 (09/23/2009)  Problem # 4:  HYPOTHYROIDISM (ICD-244.9) Assessment: Unchanged  Her updated medication list for this problem includes:    Synthroid 75 Mcg Tabs (Levothyroxine sodium) ..... One tab by mouth once daily  Orders: T-TSH [phone])  Labs Reviewed: TSH: 1.946 (09/23/2009)    HgBA1c: 6.5 (08/02/2009) Chol: 114 (09/23/2009)   HDL: 31 (09/23/2009)   LDL: 56 (09/23/2009)   TG: 133 (09/23/2009)  Problem # 5:  OBESITY (ICD-278.00) Assessment: Deteriorated  Ht: 66.5 (10/07/2009)   Wt: 256.75 (10/07/2009)   BMI: 40.97 (10/07/2009)  Problem # 6:  DIABETES MELLITUS, TYPE II (ICD-250.00) Assessment: Unchanged  Her updated medication list for this problem includes:    Glipizide 5 Mg Tabs (Glipizide) ..... One tab by mouth once daily    Losartan Potassium-hctz 50-12.5 Mg Tabs (Losartan potassium-hctz) .Marland Kitchen... Take 1 tablet by mouth once a day  Orders: Glucose, (CBG) (82962) T- Hemoglobin A1C [phone])  Labs Reviewed: Creat: 1.55 (09/23/2009)    Reviewed HgBA1c results: 6.5 (08/02/2009)  6.6 (05/19/2009)  Complete Medication List: 1)  Bidil 20-37.5 Mg Tabs (Isosorb dinitrate-hydralazine) .... One tab by mouth three times a day 2)  Coumadin 5 Mg Tabs (Warfarin sodium) .... Take 1 tablet by mouth three times a day 3)  Synthroid 75 Mcg Tabs (Levothyroxine sodium) .... One tab by mouth once daily 4)  Lasix 40 Mg Tabs (Furosemide) .... One tab by mouth once daily 5)  Omeprazole 40 Mg Cpdr (Omeprazole) ....  Take 1 tablet by mouth once a day 6)  Calcium 600 1500 Mg Tabs (Calcium carbonate) .... One tab by mouth once daily 7)  Toprol Xl 100 Mg Tb24 (Metoprolol succinate) .... One tab by mouth once daily 8)  Hydrocodone-acetaminophen 5-325 Mg Tabs (Hydrocodone-acetaminophen) .... One tab by mouth once daily 9)  Klor-con M20 20 Meq Tbcr (Potassium chloride crys cr) .... One and half tabs by mouth once daily 10)  Glipizide 5 Mg Tabs (Glipizide) .... One tab by mouth once daily 11)  Vytorin 10-20 Mg Tabs (Ezetimibe-simvastatin) .... One tab by mouth at bedtime 12)  Tussionex Pennkinetic Er 8-10 Mg/21ml Lqcr (Chlorpheniramine-hydrocodone) .... Take 5cc two times a day as needed 13)  Promethazine Hcl 25 Mg Tabs (Promethazine hcl) .... One tab by mouth two times a day prn 14)  Advair Diskus 100-50 Mcg/dose Misc (Fluticasone-salmeterol) .... Inhale one puff by mouth two times a day 15)  Lanoxin 0.25 Mg Tabs (Digoxin) .... One tab by mouth qd 16)  Folic Acid 1 Mg Tabs (Folic acid) .... Take 1 tablet by mouth once a day 17)  Restoril 15 Mg Caps (  Temazepam) .... Take 1 tab by mouth at bedtime 18)  Amrix 15 Mg Xr24h-cap (Cyclobenzaprine hcl) .... Take 1 tablet by mouth once a day 19)  Coumadin 10 Mg Tabs (Warfarin sodium) .... Take 1 tablet by mouth once a day except on thursday 13 mg 20)  Cyclobenzaprine Hcl 10 Mg Tabs (Cyclobenzaprine hcl) .... Take 1 tab by mouth at bedtime 21)  Diflucan 150 Mg Tabs (Fluconazole) .... Take 1 tablet by mouth once a day 22)  Ciprofloxacin Hcl 500 Mg Tabs (Ciprofloxacin hcl) .... Take 1 tablet by mouth two times a day 23)  Duoneb 0.5-2.5 (3) Mg/11ml Soln (Ipratropium-albuterol) .... Take 1 tablet by mouth three times a day 24)  Losartan Potassium-hctz 50-12.5 Mg Tabs (Losartan potassium-hctz) .... Take 1 tablet by mouth once a day 25)  Gabapentin 100 Mg Caps (Gabapentin) .... One to three capsules at  night for leg pain and burning 26)  Flucinolone Otic  .... 2 drops to affected  ear two times a day x 1 week 27)  Tamiflu 75 Mg Caps (Oseltamivir phosphate) .... Take 1 tablet by mouth two times a day 28)  Tessalon Perles 100 Mg Caps (Benzonatate) .... Take 1 tablet by mouth three times a day 29)  Cyclobenzaprine Hcl 10 Mg Tabs (Cyclobenzaprine hcl) .... Take 1 tab by mouth at bedtime  as needed  Patient Instructions: 1)  Please schedule a follow-up appointment in 3 months. 2)  It is important that you exercise regularly at least 20 minutes 5 times a week. If you develop chest pain, have severe difficulty breathing, or feel very tired , stop exercising immediately and seek medical attention. 3)  You need to lose weight. Consider a lower calorie diet and regular exercise.  4)  new med is sent in for your musclwe spasm 5)  TSH prior to visit, ICD-9: 6)  HbgA1C prior to visit, ICD-9:   end March Prescriptions: CYCLOBENZAPRINE HCL 10 MG TABS (CYCLOBENZAPRINE HCL) Take 1 tab by mouth at bedtime  as needed  #30 x 3   Entered and Authorized by:   [name] MD   Signed by:   [name] MD on 10/07/2009   Method used:   Electronically to        [facility] (retail)       [address]       [place], [place]  [address]       Ph: [phone]       Fax: [phone]   RxID:   [id number]   Laboratory Results   Blood Tests   Date/Time Received: October 07, 2009  Date/Time Reported: October 07, 2009   Glucose (random): 122 mg/dL   (Normal Range: 70-105)

## 2010-10-25 NOTE — Miscellaneous (Signed)
  Clinical Lists Changes  Medications: Rx of LANOXIN 0.25 MG TABS (DIGOXIN) one tab by mouth qd;  #90 x 5;  Signed;  Entered by: Kate Sable;  Authorized by: [name] MD;  Method used: Printed then faxed to [facility] [address] [place], [place], [place]  [address], Ph: [phone], Fax: [phone]    Prescriptions: LANOXIN 0.25 MG TABS (DIGOXIN) one tab by mouth qd  #90 x 5   Entered by:   Kate Sable   Authorized by:   [name] MD   Signed by:   Kate Sable on 11/17/2008   Method used:   Printed then faxed to ...       [facility] (retail)       [address]       [place], [place]  [address]       Ph: [phone]       Fax: [phone]   RxID:   [id number]

## 2010-10-25 NOTE — Assessment & Plan Note (Signed)
Summary: FEELS BAD   Vital Signs:  Patient profile:   77 year old female Menstrual status:  postmenopausal Height:      66.5 inches Weight:      250 pounds BMI:     39.89 O2 Sat:      96 % Pulse rate:   78 / minute Resp:     16 per minute BP sitting:   110 / 60  (left arm) Cuff size:   large  Vitals Entered By: Kate Sable LPN CC: feels so bad, has no energy, and not much of an appetite. Been going on for about a week now   Primary Provider:  [name] MD  CC:  feels so bad, has no energy, and and not much of an appetite. Been going on for about a week now.  History of Present Illness: Pt reports not feeling well x 1 week.  No pain.  c/o no energy and decreased appetitie. Nausea off & on.  She has had this for a long time but seems a little worse in the last week.  No vomiting.  BMs no change.  No dysuria. No cough.  + difficulty breathing with exertion x yrs, no change.  Hx of artificial valves, and defibrillator. Pt states she has been having some nasal congestion x > 1 week.  Is blowing green mucus from the Lt side of her nose.   Hx of HTN, DM, and CKD.  Taking meds as prescribed.     Allergies (verified): 1)  ! Pcn 2)  ! * Niaspan  Past History:  Past medical history reviewed for relevance to current acute and chronic problems.  Past Medical History: Reviewed history from 02/24/2008 and no changes required. 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 ENT:  Complains of nasal congestion; denies ear discharge, postnasal drainage, sinus pressure, and sore throat. CV:  Denies chest pain or discomfort. Resp:  Denies cough and  shortness of breath. GI:  Denies abdominal pain, nausea, and vomiting.  Physical Exam  General:  Well-developed,well-nourished obese,,in no acute distress; alert,appropriate and cooperative throughout examination Head:  Normocephalic and atraumatic without obvious abnormalities. No apparent alopecia or balding. Ears:  External ear exam shows no significant lesions or deformities.  Otoscopic examination reveals clear canals, tympanic membranes are intact bilaterally without bulging, retraction, inflammation or discharge. Hearing is grossly normal bilaterally. Nose:  no external deformity and no external erythema.  Nasal turbs erythematous.  Thick yellowish mucus Lt nare noted. Mouth:  Oral mucosa and oropharynx without lesions or exudates.   Neck:  No deformities, masses, or tenderness noted. Lungs:  Normal respiratory effort, chest expands symmetrically. Lungs are clear to auscultation, no crackles or wheezes. Heart:  normal rate and regular rhythm.   Cervical Nodes:  No lymphadenopathy noted Psych:  Cognition and judgment appear intact. Alert and cooperative with normal attention span and concentration. No apparent delusions, illusions, hallucinations   Impression & Recommendations:  Problem # 1:  SINUSITIS, ACUTE (ICD-461.9) Assessment New  Her updated medication list for this problem includes:    Sulfamethoxazole-tmp Ds 800-160 Mg Tabs (Sulfamethoxazole-trimethoprim) .Marland Kitchen... Take 1 two times a day x 10 days for sinus infection  Orders: T-CBC w/Diff [id number])  Problem # 2:  FATIGUE (ICD-780.79) Assessment: Deteriorated  Problem #  3:  CHRONIC KIDNEY DISEASE STAGE I (ICD-585.1) Assessment: Comment Only  Orders: T-Basic Metabolic Panel (99991111) UA Dipstick w/o Micro (automated)  (81003)  Problem # 4:  HYPERTENSION (ICD-401.9) Assessment: Comment Only  Her updated medication list for this problem includes:    Toprol Xl 100 Mg Tb24 (Metoprolol succinate) ..... One tab by  mouth once daily    Furosemide 40 Mg Tabs (Furosemide) ..... One and one half tabs once daily  BP today: 110/60 Prior BP: 118/74 (03/21/2010)  Labs Reviewed: K+: 4.5 (02/22/2010) Creat: : 1.86 (02/22/2010)   Chol: 101 (02/22/2010)   HDL: 30 (02/22/2010)   LDL: 40 (02/22/2010)   TG: 155 (02/22/2010)  Complete Medication List: 1)  Coumadin 5 Mg Tabs (Warfarin sodium) .... Take 1 tablet by mouth three times a day 2)  Synthroid 75 Mcg Tabs (Levothyroxine sodium) .... One tab by mouth once daily 3)  Oscal 500/200 D-3 500-200 Mg-unit Tabs (Calcium-vitamin d) .... Take 1 tablet by mouth three times a day 4)  Toprol Xl 100 Mg Tb24 (Metoprolol succinate) .... One tab by mouth once daily 5)  Hydrocodone-acetaminophen 5-325 Mg Tabs (Hydrocodone-acetaminophen) .... One tab by mouth once daily 6)  Klor-con M20 20 Meq Tbcr (Potassium chloride crys cr) .... One and half tabs by mouth once daily 7)  Glipizide 5 Mg Tabs (Glipizide) .... One tab by mouth once daily 8)  Vytorin 10-20 Mg Tabs (Ezetimibe-simvastatin) .... One tab by mouth at bedtime 9)  Advair Diskus 100-50 Mcg/dose Misc (Fluticasone-salmeterol) .... Inhale one puff by mouth two times a day 10)  Lanoxin 0.25 Mg Tabs (Digoxin) .... One tab by mouth qd 11)  Folic Acid 1 Mg Tabs (Folic acid) .... Take 1 tablet by mouth once a day 12)  Restoril 15 Mg Caps (Temazepam) .... Take 1 tab by mouth at bedtime 13)  Coumadin 10 Mg Tabs (Warfarin sodium) .... Take 1 tablet by mouth once a day except on thursday 13 mg 14)  Gabapentin 100 Mg Caps (Gabapentin) .... One to three capsules at  night for leg pain and burning 15)  Promethazine Hcl 25 Mg/ml Soln (Promethazine hcl) .... Take 1 tablet by mouth one  time a day as needed 16)  Oxycodone-acetaminophen 5-325 Mg Tabs (Oxycodone-acetaminophen) .... Take 1 tablet by mouth once a day 17)  Furosemide 40 Mg Tabs (Furosemide) .... One and one half tabs once daily 18)  Bidil 20-37.5 Mg Tabs (Isosorb  dinitrate-hydralazine) .... Take 1 tablet by mouth two times a day 19)  Sulfamethoxazole-tmp Ds 800-160 Mg Tabs (Sulfamethoxazole-trimethoprim) .... Take 1 two times a day x 10 days for sinus infection  Other Orders: Influenza Vaccine MCR [id number])  Patient Instructions: 1)  Keep your next appt with DrSimpson. 2)  If you feel that you are worsening over the weekend go to the ER. 3)  I have prescribed an antibiotic for your sinus infection. 4)  I have ordered blood work have this drawn today. Prescriptions: SULFAMETHOXAZOLE-TMP DS 800-160 MG TABS (SULFAMETHOXAZOLE-TRIMETHOPRIM) take 1 two times a day x 10 days for sinus infection  #20 x 0   Entered and Authorized by:   Kennith Gain PA   Signed by:   Kennith Gain PA on 05/27/2010   Method used:   Electronically to        [facility] (retail)       [address]       [place], [place]  [address]  Ph: [phone]       Fax: [phone]   [id number]    Influenza Vaccine    Vaccine Type: Fluvax MCR    Site: right deltoid    Mfr: novartis     Dose: 0.5 ml    Route: IM    Given by: Kate Sable LPN    Exp. Date: 01/2011    Lot #: [id number]    Orders Added: 1)  Influenza Vaccine MCR [00025] 2)  T-Basic Metabolic Panel 0000000 3)  T-CBC w/Diff [id number] 4)  UA Dipstick w/o Micro (automated)  [81003] 5)  Est. Patient Level IV [id number]   Laboratory Results   Urine Tests    Routine Urinalysis   Color: yellow Glucose: negative   (Normal Range: Negative) Bilirubin: negative   (Normal Range: Negative) Ketone: negative   (Normal Range: Negative) Spec. Gravity: 1.015   (Normal Range: 1.003-1.035) Blood: negative   (Normal Range: Negative) pH: 7.0   (Normal Range: 5.0-8.0) Protein: negative   (Normal Range: Negative) Urobilinogen: 0.2   (Normal Range: 0-1) Nitrite: negative   (Normal Range: Negative) Leukocyte Esterace: negative   (Normal Range: Negative)

## 2010-10-25 NOTE — Progress Notes (Signed)
Summary: medicine  Phone Note Call from Patient   Summary of Call: has a bottle for clindamycin 150 mg and she has a few of thoses pills but the date is 2.18.10  and  is this date to old to take this medicine needs some before she goes to have her teeth cleaned the 16th of november  call back at  [phone] Initial call taken by: Dierdre Harness,  August 04, 2010 3:47 PM  Follow-up for Phone Call        pls letpt know med has been sent in tioday, and all the best with her dental work, she needs herteeth in good order for thanksgiving! Follow-up by: [name] MD,  August 08, 2010 8:16 AM  Additional Follow-up for Phone Call Additional follow up Details #1::        patient aware Additional Follow-up by: Dierdre Harness,  August 08, 2010 11:13 AM    New/Updated Medications: CLEOCIN 300 MG CAPS (CLINDAMYCIN HCL) two capsules by mouth one hour before surgery Prescriptions: CLEOCIN 300 MG CAPS (CLINDAMYCIN HCL) two capsules by mouth one hour before surgery  #2 x 3   Entered and Authorized by:   [name] MD   Signed by:   [name] MD on 08/08/2010   Method used:   Electronically to        [facility] (retail)       [address]       [place], [place]  [address]       Ph: [phone]       Fax: [phone]   RxID:   [id number]

## 2010-10-25 NOTE — Letter (Signed)
Summary: [facility]  [facility]   Imported By: Dierdre Harness 09/02/2010 08:50:42  _____________________________________________________________________  External Attachment:    Type:   Image     Comment:   External Document

## 2010-10-25 NOTE — Progress Notes (Signed)
Summary: refill  Phone Note Call from Patient Call back at Work Phone    Summary of Call: pt needs to get some more stomach pills from [facility]. [id number] Initial call taken by: Lenn Cal,  March 02, 2009 3:58 PM      Prescriptions: ACIPHEX 20 MG  TBEC (RABEPRAZOLE SODIUM) one tab by mouth once daily  #30 x 4   Entered by:   Kate Sable   Authorized by:   [name] MD   Signed by:   Kate Sable on 03/03/2009   Method used:   Electronically to        [facility] (retail)       [address]       [place], [place]  [address]       Ph: [phone]       Fax: [phone]   RxID:   [id number]

## 2010-10-25 NOTE — Progress Notes (Signed)
Summary: [facility]  [facility]   Imported By: Dierdre Harness 11/26/2008 13:38:05  _____________________________________________________________________  External Attachment:    Type:   Image     Comment:   External Document

## 2010-10-25 NOTE — Assessment & Plan Note (Signed)
Summary: office visit   Vital Signs:  Patient profile:   77 year old female Menstrual status:  postmenopausal Height:      66.5 inches (168.91 cm) Weight:      243.38 pounds (110.63 kg) BMI:     38.83 Pulse rate:   76 / minute Pulse rhythm:   regular Resp:     16 per minute BP sitting:   122 / 70  (right arm)  Vitals Entered By: Jimmey Ralph LPN (June 21, 624THL 579FGE PM)  Nutrition Counseling: Patient's BMI is greater than 25 and therefore counseled on weight management options. CC: left eye red and swollen, some runnin Is Patient Diabetic? Yes  Pain Assessment Patient in pain? no        CC:  left eye red and swollen and some runnin.  History of Present Illness: 5 day h/o red, painful upper eyelid with drainage from the left eye, no blurry vision.,,, no fever , chills or sunus drainage.She denies fever or chills, she denies trauma to the eye.  The pt has otherwise being doing well, she denies polyuria, polydypsia , blurred vision or hypoglycemic episodes. She has had no recent head or chest congestion.  she tests fasting sugars , which are seldom over 130. She denies dysuria or frequency.    Allergies: 1)  ! Pcn 2)  ! * Niaspan  Review of Systems      See HPI General:  Denies chills and fever. Eyes:  See HPI. CV:  Denies chest pain or discomfort, shortness of breath with exertion, and swelling of feet. Resp:  Denies cough, sputum productive, and wheezing. GI:  Complains of abdominal pain and indigestion; denies constipation, nausea, and vomiting; pt considering elective cholecystectomy, however I think becuse of difficulty with anticoaggulation in the past , the cardiologist is not enthuseed, esp as she is often asymptomatic.Marland Kitchen GU:  Denies dysuria and urinary frequency. MS:  Complains of joint pain and stiffness. Psych:  Denies anxiety and depression. Endo:  Denies cold intolerance, excessive hunger, excessive thirst, excessive urination, heat intolerance, polyuria, and  weight change.  Physical Exam  General:  alert, well-nourished, and well-hydrated.  HEENT: No facial asymmetry,  EOMI, No sinus tenderness, TM's Clear, oropharynx  pink and moist. erythematous swollen left upper eyelid, with drainage  Chest: Clear to auscultation bilaterally.  CVS: S1, S2, No murmurs, No S3.   Abd: Soft, Nontender.  MS: Adequate ROM spine, hips, shoulders and knees.  Ext: No edema.   CNS: CN 2-12 intact, power tone and sensation normal throughout.   Skin: Intact, no visible lesions or rashes.  Psych: Good eye contact, normal affect.  Memory intact, not anxious or depressed appearing.         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   Problem # 3:  HYPERTENSION (ICD-401.9) Assessment: Unchanged  Her updated medication list for this problem includes:    Lasix 40 Mg Tabs (Furosemide) ..... One tab by mouth once daily    Toprol Xl 100 Mg Tb24 (Metoprolol succinate) ..... One tab by mouth once daily  BP today: 122/70 Prior BP: 118/70 (02/09/2009)  Labs Reviewed: K+: 4.8 (02/11/2009) Creat: : 1.61 (02/11/2009)   Chol:  98 (02/11/2009)   HDL: 29 (02/11/2009)   LDL: 42 (02/11/2009)   TG: 136 (02/11/2009)  Problem # 4:  DIABETES MELLITUS, TYPE II (ICD-250.00) Assessment: Unchanged  Her updated medication list for this problem includes:    Glipizide 5 Mg Tabs (Glipizide) ..... One tab by mouth once daily  Labs Reviewed: Creat: 1.61 (02/11/2009)    Reviewed HgBA1c results: 6.5 (02/11/2009)  6.4 (11/10/2008)  Complete Medication List: 1)  Bidil 20-37.5 Mg Tabs (Isosorb dinitrate-hydralazine) .... One tab by  mouth three times a day 2)  Coumadin 5 Mg Tabs (Warfarin sodium) .... Take 1 tablet by mouth three times a day 3)  Synthroid 75 Mcg Tabs (Levothyroxine sodium) .... One tab by mouth once daily 4)  Lasix 40 Mg Tabs (Furosemide) .... One tab by mouth once daily 5)  Omeprazole 40 Mg Cpdr (Omeprazole) .... Take 1 tablet by mouth once a day 6)  Calcium 600 1500 Mg Tabs (Calcium carbonate) .... One tab by mouth once daily 7)  Toprol Xl 100 Mg Tb24 (Metoprolol succinate) .... One tab by mouth once daily 8)  Hydrocodone-acetaminophen 5-325 Mg Tabs (Hydrocodone-acetaminophen) .... One tab by mouth once daily 9)  Klor-con M20 20 Meq Tbcr (Potassium chloride crys cr) .... One and half tabs by mouth once daily 10)  Glipizide 5 Mg Tabs (Glipizide) .... One tab by mouth once daily 11)  Vytorin 10-20 Mg Tabs (Ezetimibe-simvastatin) .... One tab by mouth at bedtime 12)  Tussionex Pennkinetic Er 8-10 Mg/74ml Lqcr (Chlorpheniramine-hydrocodone) .... Take 5cc two times a day as needed 13)  Promethazine Hcl 25 Mg Tabs (Promethazine hcl) .... One tab by mouth two times a day prn 14)  Advair Diskus 100-50 Mcg/dose Misc (Fluticasone-salmeterol) .... Inhale one puff by mouth two times a day 15)  Lanoxin 0.25 Mg Tabs (Digoxin) .... One tab by mouth qd 16)  Folic Acid 1 Mg Tabs (Folic acid) .... Take 1 tablet by mouth once a day 17)  Restoril 15 Mg Caps (Temazepam) .... Take 1 tab by mouth at bedtime 18)  Amrix 15 Mg Xr24h-cap (Cyclobenzaprine hcl) .... Take 1 tablet by mouth once a day 19)  Coumadin 10 Mg Tabs (Warfarin sodium) .... Take 1 tablet by mouth once a day except on thursday 13 mg 20)  Cyclobenzaprine Hcl 10 Mg Tabs (Cyclobenzaprine hcl) .... Take 1 tab by mouth at bedtime 21)  Diflucan 150 Mg Tabs (Fluconazole) .... Take 1 tablet by mouth once a day 22)  Ciprofloxacin Hcl 500 Mg Tabs (Ciprofloxacin hcl) .... Take 1 tablet by mouth two times a day  Patient Instructions: 1)  F/ U as before. 2)  you have an  infection in your left upper eyelid. I will prescribe antibiotics by mouth and I will refer you to Dr. Iona Hansen asap also  Prescriptions: CIPROFLOXACIN HCL 500 MG TABS (CIPROFLOXACIN HCL) Take 1 tablet by mouth two times a day  #20 x 0   Entered and Authorized by:   [name] MD   Signed by:   [name] MD on 03/15/2009   Method used:   Electronically to        [facility] (retail)       [address]       [place], [place]  [address]       Ph: [phone]       Fax: [phone]   RxID:   [id number]

## 2010-10-25 NOTE — Progress Notes (Signed)
Summary: [facility]   Imported By: Dierdre Harness 03/17/2009 10:38:01  _____________________________________________________________________  External Attachment:    Type:   Image     Comment:   External Document

## 2010-10-25 NOTE — Progress Notes (Signed)
Summary: REFILLS  Phone Note Call from Patient   Summary of Call: NEEDS A RX FOR DIGOXIN NEEDS JUST A FEW CALLED AT [facility] Initial call taken by: Dierdre Harness,  September 22, 2008 10:30 AM  Follow-up for Phone Call        Rx Called In Follow-up by: Jimmey Ralph LPN,  December 29, 579FGE 11:03 AM      Prescriptions: ACIPHEX 20 MG  TBEC (RABEPRAZOLE SODIUM) one tab by mouth once daily  #30 x 2   Entered by:   Jimmey Ralph LPN   Authorized by:   [name] MD   Signed by:   Jimmey Ralph LPN on [id number]   Method used:   Electronically to        [facility] (retail)       [address]       [place], [place]  [address]       Ph: [phone]       Fax: [phone]   RxID:   [id number] DIGOXIN 0.25 MG  TABS (DIGOXIN) one tab by mouth once daily  #30 x 2   Entered by:   Jimmey Ralph LPN   Authorized by:   [name] MD   Signed by:   Jimmey Ralph LPN on [id number]   Method used:   Electronically to        [facility] (retail)       [address]       [place],   [address]       Ph: [phone]       Fax: [phone]   RxID:   [id number]

## 2010-10-25 NOTE — Progress Notes (Signed)
Summary: [facility]  [facility]   Imported By: Dierdre Harness 08/27/2009 09:43:07  _____________________________________________________________________  External Attachment:    Type:   Image     Comment:   External Document

## 2010-10-25 NOTE — Letter (Signed)
Summary: [facility]  [facility]   Imported By: Dierdre Harness 07/25/2010 09:47:07  _____________________________________________________________________  External Attachment:    Type:   Image     Comment:   External Document

## 2010-10-25 NOTE — Miscellaneous (Signed)
Summary: Orders Update  Clinical Lists Changes  Orders: Added new Test order of T-Basic Metabolic Panel ([id number]) - Signed Added new Test order of T-Hepatic Function [phone]) - Signed Added new Test order of T-Lipid Profile [phone]) - Signed Added new Test order of T-CBC w/Diff [phone]) - Signed Added new Test order of T-TSH [id number]) - Signed

## 2010-10-25 NOTE — Medication Information (Signed)
Summary: [employer]   Imported By: Dierdre Harness 03/19/2009 08:25:55  _____________________________________________________________________  External Attachment:    Type:   Image     Comment:   External Document

## 2010-10-25 NOTE — Letter (Signed)
[facility]   Imported By: Dierdre Harness 06/08/2010 14:36:35  _____________________________________________________________________  External Attachment:    Type:   Image     Comment:   External Document

## 2010-10-25 NOTE — Progress Notes (Signed)
Summary: refill on med  Phone Note Call from Patient   Summary of Call: needs to get some hydrocodine 5/325mg  1 tab 1 daily. [facility]  [phone]    Initial call taken by: Lenn Cal,  March 25, 2009 11:10 AM      Prescriptions: HYDROCODONE-ACETAMINOPHEN 5-325 MG  TABS (HYDROCODONE-ACETAMINOPHEN) one tab by mouth once daily  #30 x 4   Entered by:   Kate Sable   Authorized by:   [name] MD   Signed by:   Kate Sable on 03/25/2009   Method used:   Printed then faxed to ...       [facility] (retail)       [address]       [place], [place]  [address]       Ph: [phone]       Fax: [phone]   RxID:   [id number]

## 2010-10-25 NOTE — Progress Notes (Signed)
Summary: breast  Phone Note Call from Patient   Summary of Call: She is concerned because some mornings when she wakes up she has a spot of blood on her pajama top near her left breast area. States she has never seen any in her bra or any ever come out of her breast but she always sees it upon awakening in the morning. Wants you to know incase its something that needs to be evaluated. Initial call taken by: Kate Sable,  July 06, 2009 1:18 PM  Follow-up for Phone Call        ask her when her lastmamo was doneneeds onee if over 12 months, she need s a referral for [facility] with h/o bloody nipple d//c per pt,pass on this to [facility] after you talk with her pls Follow-up by: [name] MD,  July 06, 2009 3:58 PM  Additional Follow-up for Phone Call Additional follow up Details #1::        it was May of this year. Is there anything you want her to do? Additional Follow-up by: Kate Sable,  July 07, 2009 10:32 AM    Additional Follow-up for Phone Call Additional follow up Details #2::    I would like to speak with one of the radiologists who focuses on the breast, mamo's pls call the breast center and lv a msg , get me to the phone when thety are available pls, pt has blood over left breast on several occasions, questions pls ask  Follow-up by: [name] MD,  July 07, 2009 5:42 PM  Additional Follow-up for Phone Call Additional follow up Details #3:: Details for Additional Follow-up Action Taken: called breast center for Dr. [name]. Radiologist will be calling her back on this pt Additional Follow-up by: Lenn Cal,  July 09, 2009 9:15 AM  pt  advise d  she needs a diagnostic mamgram of the left breast , i discussed thiswith the radiologist, pt is aware of this plschedule and giveher appt ino  appt at [facility] radiology, diagnostic mammogram, Thurs. Jul 22, 2009 register at 8:30 patient aware Jimmey Ralph LPN  October 15, 624THL 1:21 PM

## 2010-10-25 NOTE — Progress Notes (Signed)
Summary: CALL  Phone Note Call from Patient   Summary of Call: SHE SQUEEZED IT AND NOTHING [place]  Initial call taken by: Dierdre Harness,  July 08, 2009 9:32 AM  Follow-up for Phone Call        Called pt back and she said that you had called and just wanted you to know that she squeezed it and nothing came out of it. Follow-up by: Kate Sable,  July 08, 2009 11:09 AM

## 2010-10-25 NOTE — Progress Notes (Signed)
Summary: fax refill  Phone Note Call from Patient   Summary of Call: needs to have medicine faxed to company for 90day supple. [id number] Initial call taken by: Lenn Cal,  November 11, 2008 8:59 AM  Follow-up for Phone Call        Faxed to [employer] Follow-up by: Kate Sable,  November 11, 2008 10:47 AM

## 2010-10-25 NOTE — Assessment & Plan Note (Signed)
Summary: F UP   Vital Signs:  Patient profile:   77 year old female Menstrual status:  postmenopausal Weight:      255.12 pounds Pulse rate:   92 / minute Pulse rhythm:   regular BP sitting:   110 / 50  (right arm)  Primary Care Provider:  [name] MD   History of Present Illness: Reports  that they are doing well. Denies recent fever or chills. Denies sinus pressure, nasal congestion , ear pain or sore throat. Denies chest congestion, or cough productive of sputum. Denies chest pain, palpitations, PND, orthopnea or leg swelling. Since her last visit she had her pacemaker changed, and this was done succesfully at [facility] in March 2011  Denies abdominal pain, nausea, vomitting, diarrhea or constipation. Denies change in bowel movements or bloody stool. Denies dysuria , frequency, incontinence or hesitancy.  Denies headaches, vertigo, seizures. Denies depression, anxiety or insomnia. Denies  rash, lesions, or itch.     Allergies: 1)  ! Pcn 2)  ! * Niaspan  Past History:  Past Surgical History: Thyroidectomy (1966) Pacemaker placed (2004) Valve replacement aortic and mitral (2001) Mitral valve replacement (2004) Right breast cyst removed benign (1985) Left breast cyst removed benign (1999) Right cataract removed (2005) Defibrillator implanted (2006) Defibrillator replace (2007) defibrillator replaced 2011, March 9  Review of Systems      See HPI Eyes:  Denies blurring, discharge, and red eye. MS:  Complains of joint pain, low back pain, mid back pain, and stiffness. Endo:  Denies cold intolerance, excessive hunger, excessive thirst, excessive urination, heat intolerance, polyuria, and weight change; tests on avg 4 times per week. Heme:  Denies abnormal bruising and bleeding. Allergy:  Complains of seasonal allergies.  Physical Exam  General:  alert, well-hydrated, and overweight-appearing. HEENT: No facial asymmetry,  EOMI, No sinus  tenderness, TM's Clear, oropharynx  pink and moist.   Chest: Clear to auscultation bilaterally.  CVS: S1, S2, systolic murmurs, No S3. positveclick.  Abd: Soft, Nontender.  [id number] ROM spine, hips, shoulders and knees.  Ext: No edema.   CNS: CN 2-12 intact, power tone and sensation normal throughout.   Skin: Intact, no visible lesions or rashes.  Psych: Good eye contact, normal affect.  Memory intact, not anxious or depressed appearing.      Impression & Recommendations:  Problem # 1:  OTHER SIGN AND SYMPTOM IN BREAST (ICD-611.79) Assessment Comment Only  Orders: Radiology Referral (Radiology), pt has a past h/o bloody left breast d/c , needs rept imaging and surgical f/u, she has anxiety as as she has ahd benign breast bx in the past Surgical Referral (Surgery)  Problem # 2:  UNSPECIFIED CARDIOVASCULAR DISEASE (ICD-429.2) Assessment: Unchanged  Her updated medication list for this problem includes:    Lasix 40 Mg Tabs (Furosemide) ..... One tab by mouth once daily    Toprol Xl 100 Mg Tb24 (Metoprolol succinate) ..... One tab by mouth once daily    Losartan Potassium-hctz 50-12.5 Mg Tabs (Losartan potassium-hctz) .Marland Kitchen... Take 1 tablet by mouth once a day  Problem # 3:  DIABETES MELLITUS, TYPE II (ICD-250.00) Assessment: Comment Only  Her updated medication list for this problem includes:    Glipizide 5 Mg Tabs (Glipizide) ..... One tab by mouth once daily    Losartan Potassium-hctz 50-12.5 Mg Tabs (Losartan potassium-hctz) .Marland Kitchen... Take 1 tablet by mouth once a day  Labs Reviewed: Creat: 1.87 (12/23/2009)    Reviewed HgBA1c results: 6.7 (12/23/2009)  6.5 (08/02/2009)  Problem #  4:  HYPERTENSION (ICD-401.9) Assessment: Unchanged  Her updated medication list for this problem includes:    Lasix 40 Mg Tabs (Furosemide) ..... One tab by mouth once daily    Toprol Xl 100 Mg Tb24 (Metoprolol succinate) ..... One tab by mouth once daily    Losartan Potassium-hctz 50-12.5 Mg  Tabs (Losartan potassium-hctz) .Marland Kitchen... Take 1 tablet by mouth once a day  Orders: T-Basic Metabolic Panel (99991111)  BP today: 110/50 Prior BP: 120/50 (10/07/2009)  Labs Reviewed: K+: 4.5 (12/23/2009) Creat: : 1.87 (12/23/2009)   Chol: 114 (09/23/2009)   HDL: 31 (09/23/2009)   LDL: 56 (09/23/2009)   TG: 133 (09/23/2009)  Problem # 5:  HYPERLIPIDEMIA (ICD-272.4) Assessment: Comment Only  Her updated medication list for this problem includes:    Vytorin 10-20 Mg Tabs (Ezetimibe-simvastatin) ..... One tab by mouth at bedtime  Orders: T-Hepatic Function [phone]) T-Lipid Profile [phone])  Labs Reviewed: SGOT: 19 (09/23/2009)   SGPT: 9 (09/23/2009)   HDL:31 (09/23/2009), 35 (06/23/2009)  LDL:56 (09/23/2009), 55 (06/23/2009)  Chol:114 (09/23/2009), 117 (06/23/2009)  Trig:133 (09/23/2009), 135 (06/23/2009)  Problem # 6:  OBESITY (ICD-278.00) Assessment: Deteriorated  Ht: 66.5 (10/07/2009)   Wt: 255.12 (01/10/2010)   BMI: 40.97 (10/07/2009)  Complete Medication List: 1)  Bidil 20-37.5 Mg Tabs (Isosorb dinitrate-hydralazine) .... One tab by mouth three times a day 2)  Coumadin 5 Mg Tabs (Warfarin sodium) .... Take 1 tablet by mouth three times a day 3)  Synthroid 75 Mcg Tabs (Levothyroxine sodium) .... One tab by mouth once daily 4)  Lasix 40 Mg Tabs (Furosemide) .... One tab by mouth once daily 5)  Omeprazole 40 Mg Cpdr (Omeprazole) .... Take 1 tablet by mouth once a day 6)  Calcium 600 1500 Mg Tabs (Calcium carbonate) .... One tab by mouth once daily 7)  Toprol Xl 100 Mg Tb24 (Metoprolol succinate) .... One tab by mouth once daily 8)  Hydrocodone-acetaminophen 5-325 Mg Tabs (Hydrocodone-acetaminophen) .... One tab by mouth once daily 9)  Klor-con M20 20 Meq Tbcr (Potassium chloride crys cr) .... One and half tabs by mouth once daily 10)  Glipizide 5 Mg Tabs (Glipizide) .... One tab by mouth once daily 11)  Vytorin 10-20 Mg Tabs (Ezetimibe-simvastatin) .... One tab by  mouth at bedtime 12)  Tussionex Pennkinetic Er 8-10 Mg/83ml Lqcr (Chlorpheniramine-hydrocodone) .... Take 5cc two times a day as needed 13)  Advair Diskus 100-50 Mcg/dose Misc (Fluticasone-salmeterol) .... Inhale one puff by mouth two times a day 14)  Lanoxin 0.25 Mg Tabs (Digoxin) .... One tab by mouth qd 15)  Folic Acid 1 Mg Tabs (Folic acid) .... Take 1 tablet by mouth once a day 16)  Restoril 15 Mg Caps (Temazepam) .... Take 1 tab by mouth at bedtime 17)  Amrix 15 Mg Xr24h-cap (Cyclobenzaprine hcl) .... Take 1 tablet by mouth once a day 18)  Coumadin 10 Mg Tabs (Warfarin sodium) .... Take 1 tablet by mouth once a day except on thursday 13 mg 19)  Cyclobenzaprine Hcl 10 Mg Tabs (Cyclobenzaprine hcl) .... Take 1 tab by mouth at bedtime 20)  Diflucan 150 Mg Tabs (Fluconazole) .... Take 1 tablet by mouth once a day 21)  Ciprofloxacin Hcl 500 Mg Tabs (Ciprofloxacin hcl) .... Take 1 tablet by mouth two times a day 22)  Duoneb 0.5-2.5 (3) Mg/45ml Soln (Ipratropium-albuterol) .... Take 1 tablet by mouth three times a day 23)  Losartan Potassium-hctz 50-12.5 Mg Tabs (Losartan potassium-hctz) .... Take 1 tablet by mouth once a day 24)  Gabapentin 100  Mg Caps (Gabapentin) .... One to three capsules at  night for leg pain and burning 25)  Flucinolone Otic  .... 2 drops to affected ear two times a day x 1 week 26)  Tamiflu 75 Mg Caps (Oseltamivir phosphate) .... Take 1 tablet by mouth two times a day 27)  Tessalon Perles 100 Mg Caps (Benzonatate) .... Take 1 tablet by mouth three times a day 28)  Cyclobenzaprine Hcl 10 Mg Tabs (Cyclobenzaprine hcl) .... Take 1 tab by mouth at bedtime  as needed 29)  Temazepam 30 Mg Caps (Temazepam) .... One cap by mouth at bedtime 30)  Promethazine Hcl 25 Mg/ml Soln (Promethazine hcl) .... Take 1 tablet by mouth one  time a day as needed  Other Orders: T-TSH [id number]) T-Vitamin D (25-Hydroxy) [id number])  Patient Instructions: 1)  Please schedule a follow-up  appointment in 3 months. 2)  It is important that you exercise regularly at least 20 minutes 5 times a week. If you develop chest pain, have severe difficulty breathing, or feel very tired , stop exercising immediately and seek medical attention. 3)  You need to lose weight. Consider a lower calorie diet and regular exercise.Goal is about 5 pounds 4)  Check your blood sugars regularly. If your readings are usually above 250 or below 70 you should contact our office.pls try to test at least 4 times per week 5)  BMP prior to visit, ICD-9: 6)  Hepatic Panel prior to visit, ICD-9: 7)  Lipid Panel prior to visit, ICD-9:  fasting in 3 monts. 8)  Selinda Eon will be referred for an [place] of t left breasdt and to see the surgeon again in May 9)  TSH prior to visit, ICD-9: 10)  Virt D  Prescriptions: PROMETHAZINE HCL 25 MG/ML SOLN (PROMETHAZINE HCL) Take 1 tablet by mouth one  time a day as needed  #90 x 1   Entered and Authorized by:   [name] MD   Signed by:   [name] MD on 01/10/2010   Method used:   Printed then faxed to ...       [facility] (retail)       [address]       [place], [place]  [address]       Ph: [phone]       Fax: [phone]   RxID:   [id number]

## 2010-10-25 NOTE — Progress Notes (Signed)
Summary: MEDICINE  Phone Note Call from Patient   Summary of Call: NEEDS COUD. SEND TO  [facility] [place]  Initial call taken by: Dierdre Harness,  May 02, 2010 1:16 PM  Follow-up for Phone Call        Rx Called In Follow-up by: Baldomero Lamy LPN,  August  8, 624THL 1:32 PM    Prescriptions: COUMADIN 5 MG TABS (WARFARIN SODIUM) Take 1 tablet by mouth three times a day Brand medically necessary #270 x 0   Entered by:   Baldomero Lamy LPN   Authorized by:   [name] MD   Signed by:   Baldomero Lamy LPN on [id number]   Method used:   Faxed to ...       [facility] (retail)             , [place]         Ph: [phone]       Fax: [phone]   RxID:   [id number]

## 2010-10-25 NOTE — Assessment & Plan Note (Signed)
Summary: OV   Vital Signs:  Patient Profile:   77 Years Old Female Height:     66.5 inches Weight:      247.06 pounds BMI:     39.42 O2 Sat:      98 % Pulse rate:   77 / minute Resp:     16 per minute BP sitting:   110 / 70  (left arm)  Pt. in pain?   no  Vitals Entered By: Jimmey Ralph LPN (December  8, 579FGE 1:07 PM)                  Chief Complaint:  chest congestion x 1 1/2 weeks no fever, no chills, and no body aches.  History of Present Illness: Pt. seen in the ED recently for chest congestion, she states that she was told that her problem was excess fluid in the lungs, that she should inc both the lasix and digoxin and also her INR wasvery high at 7.3.  Pt has had chest congestion and cough for the past 10 days.She sometimes produces yellowish phlegm.She denies upper respiratory symptoms.    Current Allergies: ! PCN ! * NIASPAN     Review of Systems  General      Complains of chills, fatigue, and sweats.  CV      Denies chest pain or discomfort, palpitations, and swelling of feet.  Resp      Complains of cough, shortness of breath, and sputum productive.  GI      Denies abdominal pain, bloody stools, constipation, diarrhea, nausea, and vomiting.  GU      Denies dysuria and urinary frequency.  MS      Complains of joint pain and stiffness.  Psych      Complains of anxiety.      Denies depression.  Endo      Denies cold intolerance, excessive hunger, excessive thirst, excessive urination, heat intolerance, polyuria, and weight change.   Physical Exam  General:     overweight-appearing.   Head:     Normocephalic and atraumatic without obvious abnormalities. No apparent alopecia or balding.maxillary andfrontal sinus tenderness Eyes:     vision grossly intact.   Ears:     External ear exam shows no significant lesions or deformities.  Otoscopic examination reveals clear canals, tympanic membranes are intact bilaterally without bulging,  retraction, inflammation or discharge. Hearing is grossly normal bilaterally. Nose:     no external deformity and no nasal discharge.   Mouth:     pharynx pink and moist and fair dentition.   Neck:     No deformities, masses, or tenderness noted. Lungs:     decreased air entry with bilateral wheezes and crackles Heart:     positive murmur Abdomen:     soft and non-tender.   Extremities:     decreased rOM spine, hips and knees, no edema Neurologic:     alert & oriented X3, cranial nerves II-XII intact, strength normal in all extremities, and sensation intact to light touch.      Impression & Recommendations:  Problem # 1:  ACUTE BRONCHITIS (ICD-466.0) Assessment: Comment Only  Her updated medication list for this problem includes:    Ipratropium-albuterol 0.5-2.5 (3) Mg/71ml Soln (Ipratropium-albuterol) ..... Use 1 vial in nebulizer every 4 hours as needed    Septra Ds 800-160 Mg Tabs (Sulfamethoxazole-trimethoprim) .Marland Kitchen... Take 1 tablet by mouth two times a day    Tessalon Perles 100 Mg Caps (Benzonatate) .Marland Kitchen... Take 1 tablet  by mouth three times a day    Tussionex Pennkinetic Er 8-10 Mg/56ml Lqcr (Chlorpheniramine-hydrocodone) .Marland Kitchen... Take 5cc two times a day as needed    Levaquin 500 Mg Tabs (Levofloxacin) .Marland Kitchen... Take 1 tablet by mouth once a day   Problem # 2:  DIABETES MELLITUS, TYPE II (ICD-250.00) Assessment: Comment Only  Her updated medication list for this problem includes:    Glipizide 5 Mg Tabs (Glipizide) ..... One tab by mouth once daily  Orders: Glucose, (CBG) [phone])  Labs Reviewed: HgBA1c: 6.5 (06/10/2008)   Creat: 1.90 (07/27/2008)   Microalbumin: 4.76 (07/31/2008)   Problem # 3:  HYPERLIPIDEMIA (ICD-272.4) Assessment: Unchanged  Her updated medication list for this problem includes:    Vytorin 10-20 Mg Tabs (Ezetimibe-simvastatin) ..... One tab by mouth at bedtime  Labs Reviewed: Chol: 103 (07/27/2008)   HDL: 38 (07/27/2008)   LDL: 46 (07/27/2008)   TG:  95 (07/27/2008) SGOT: 20 (07/27/2008)   SGPT: 10 (07/27/2008)   Problem # 4:  HYPERTENSION (ICD-401.9) Assessment: Unchanged  Her updated medication list for this problem includes:    Lasix 40 Mg Tabs (Furosemide) ..... One tab by mouth once daily    Toprol Xl 100 Mg Tb24 (Metoprolol succinate) ..... One tab by mouth once daily  BP today: 110/70 Prior BP: 124/62 (07/22/2008)  Labs Reviewed: Creat: 1.90 (07/27/2008) Chol: 103 (07/27/2008)   HDL: 38 (07/27/2008)   LDL: 46 (07/27/2008)   TG: 95 (07/27/2008)   Problem # 5:  HEART VALVE REPLACED BY OTHER MEANS (ICD-V43.3) Assessment: Comment Only currently overanticoagulated , will rept PT/INR and determine if pt is able to resume coumadin at this tome, her lab will be forwarded to the cardiologists who follow her also, and who monitor her coumadin  Complete Medication List: 1)  Digoxin 0.25 Mg Tabs (Digoxin) .... One tab by mouth once daily 2)  Bidil 20-37.5 Mg Tabs (Isosorb dinitrate-hydralazine) .... One tab by mouth three times a day 3)  Coumadin 5 Mg Tabs (Warfarin sodium) .... Uad 4)  Synthroid 75 Mcg Tabs (Levothyroxine sodium) .... One tab by mouth once daily 5)  Lasix 40 Mg Tabs (Furosemide) .... One tab by mouth once daily 6)  Aciphex 20 Mg Tbec (Rabeprazole sodium) .... One tab by mouth once daily 7)  Calcium 600 1500 Mg Tabs (Calcium carbonate) .... One tab by mouth once daily 8)  Toprol Xl 100 Mg Tb24 (Metoprolol succinate) .... One tab by mouth once daily 9)  Hydrocodone-acetaminophen 5-325 Mg Tabs (Hydrocodone-acetaminophen) .... One tab by mouth once daily 10)  Klor-con M20 20 Meq Tbcr (Potassium chloride crys cr) .... One and half tabs by mouth once daily 11)  Glipizide 5 Mg Tabs (Glipizide) .... One tab by mouth once daily 12)  Vytorin 10-20 Mg Tabs (Ezetimibe-simvastatin) .... One tab by mouth at bedtime 13)  Ipratropium-albuterol 0.5-2.5 (3) Mg/25ml Soln (Ipratropium-albuterol) .... Use 1 vial in nebulizer every 4  hours as needed 14)  Septra Ds 800-160 Mg Tabs (Sulfamethoxazole-trimethoprim) .... Take 1 tablet by mouth two times a day 15)  Tessalon Perles 100 Mg Caps (Benzonatate) .... Take 1 tablet by mouth three times a day 16)  Tussionex Pennkinetic Er 8-10 Mg/30ml Lqcr (Chlorpheniramine-hydrocodone) .... Take 5cc two times a day as needed 17)  Levaquin 500 Mg Tabs (Levofloxacin) .... Take 1 tablet by mouth once a day 18)  Digoxin 0.25 Mg Tabs (Digoxin) .... One tab by mouth qd  Other Orders: T- * Misc. Laboratory test [id number])   Patient Instructions: 1)  Please schedule a follow-up appointment in 4 to 6 weeks. 2)  You are being treated fo acute bronchitis. You need to take all the meds  3)      Prescriptions: DIGOXIN 0.25 MG TABS (DIGOXIN) ONE TAB by mouth QD  #90 x 3   Entered and Authorized by:   [name] MD   Signed by:   [name] MD on 09/05/2008   Method used:   Handwritten   [id number] TESSALON PERLES 100 MG CAPS (BENZONATATE) Take 1 tablet by mouth three times a day  #30 x 0   Entered by:   Jimmey Ralph LPN   Authorized by:   [name] MD   Signed by:   Jimmey Ralph LPN on 624THL   Method used:   Electronically to        [facility] (retail)       [address]       [place], [place]  [address]       Ph: [phone]       Fax: [phone]   RxID:   [id number] LEVAQUIN 500 MG TABS (LEVOFLOXACIN) Take 1 tablet by mouth once a day  #7 x 0   Entered and Authorized by:   [name] MD   Signed by:   [name] MD on 09/01/2008   Method used:   Electronically to        [facility] [address] (retail)       [address]       [place], [place]  [address]       Ph: [phone]       Fax: [phone]   RxID:   [id number]  ] Laboratory Results   Blood Tests   Date/Time Received: 09/01/08 1:09p Date/Time Reported: 09/01/08 1:09p  Glucose (random): 139 mg/dL    (Normal Range: 70-105)

## 2010-10-25 NOTE — Progress Notes (Signed)
  Phone Note Other Incoming   Caller: dr simpson/befakado Summary of Call: pt needs chem 7 and h/h to be drawn Oct 24, pls let her know , she can collect on the day of or before, copy to besent to dr befakado, and pls order the labs Initial call taken by: [name] MD,  June 28, 2010 4:22 PM  Follow-up for Phone Call        lab ordered, patient aware Follow-up by: Baldomero Lamy LPN,  October  5, 624THL 10:27 AM

## 2010-10-25 NOTE — Miscellaneous (Signed)
Summary: Orders Update  Clinical Lists Changes  Orders: Added new Test order of T-Basic Metabolic Panel ([id number]) - Signed  

## 2010-10-25 NOTE — Letter (Signed)
Summary: Discharge Summary  Discharge Summary   Imported By: Dierdre Harness 12/06/2009 15:58:49  _____________________________________________________________________  External Attachment:    Type:   Image     Comment:   External Document

## 2010-10-25 NOTE — Progress Notes (Signed)
Summary: RX  Phone Note Call from Patient   Summary of Call: NEEDS A RX 10 PILLS SEND IT TO [facility]  [facility] SHE HAS NONE WAITING OH HER MAIL ORDER TO COME IN ASAP SHE HAS NONE Initial call taken by: Dierdre Harness,  April 18, 2010 9:40 AM  Follow-up for Phone Call        Rx Called In Follow-up by: Baldomero Lamy LPN,  July 25, 624THL [id number] AM    Prescriptions: BIDIL 20-37.5 MG TABS (ISOSORB DINITRATE-HYDRALAZINE) Take 1 tablet by mouth two times a day  #10 x 0   Entered by:   Baldomero Lamy LPN   Authorized by:   [name] MD   Signed by:   Baldomero Lamy LPN on 624THL   Method used:   Electronically to        [facility] (retail)       [address]       [place],   [address]       Ph: [phone]       Fax: [phone]   RxID:   [id number]

## 2010-10-25 NOTE — Progress Notes (Signed)
Summary: [name]  DR. NEIJSTROM   Imported By: Dierdre Harness 03/02/2010 09:53:47  _____________________________________________________________________  External Attachment:    Type:   Image     Comment:   External Document

## 2010-10-25 NOTE — Assessment & Plan Note (Signed)
Summary: FOLLOW UP   Vital Signs:  Patient profile:   77 year old female Menstrual status:  postmenopausal Height:      66.5 inches (168.91 cm) Weight:      242.31 pounds (110.14 kg) BMI:     38.66 O2 Sat:      96 % Pulse rate:   80 / minute Pulse rhythm:   irregular Resp:     16 per minute BP sitting:   118 / 70  (left arm) Cuff size:   large  Vitals Entered By: Kate Sable (Feb 09, 2009 12:56 PM)  Nutrition Counseling: Patient's BMI is greater than 25 and therefore counseled on weight management options. CC: Follow up chronic problems Is Patient Diabetic? Yes   years   days  Menstrual Status postmenopausal Last PAP Result Normal   CC:  Follow up chronic problems.  History of Present Illness: Patient reports doing fairly  well.  Denies any recent fever or chills.  Denies any appetite change or change in bowel movements. Patient denies depression, anxiety or insomnia. She has concerns about increased swelling and tenderness of her varicose veins, esp on the right leg. She wants her vascular surgeon to re-evaluate this. She denies polyuria, polydypsi or hypoglycemic episodes.  she has the podiatrist cut her nails regularly.   Current Medications (verified): 1)  Bidil 20-37.5 Mg  Tabs (Isosorb Dinitrate-Hydralazine) .... One Tab By Mouth Three Times A Day 2)  Coumadin 5 Mg Tabs (Warfarin Sodium) .... Take 1 Tablet By Mouth Three Times A Day 3)  Synthroid 75 Mcg  Tabs (Levothyroxine Sodium) .... One Tab By Mouth Once Daily 4)  Lasix 40 Mg  Tabs (Furosemide) .... One Tab By Mouth Once Daily 5)  Aciphex 20 Mg  Tbec (Rabeprazole Sodium) .... One Tab By Mouth Once Daily 6)  Calcium 600 1500 Mg  Tabs (Calcium Carbonate) .... One Tab By Mouth Once Daily 7)  Toprol Xl 100 Mg  Tb24 (Metoprolol Succinate) .... One Tab By Mouth Once Daily 8)  Hydrocodone-Acetaminophen 5-325 Mg  Tabs (Hydrocodone-Acetaminophen) .... One Tab By Mouth Once Daily 9)  Klor-Con M20 20 Meq  Tbcr  (Potassium Chloride Crys Cr) .... One and Half Tabs By Mouth Once Daily 10)  Glipizide 5 Mg  Tabs (Glipizide) .... One Tab By Mouth Once Daily 11)  Vytorin 10-20 Mg  Tabs (Ezetimibe-Simvastatin) .... One Tab By Mouth At Bedtime 12)  Tussionex Pennkinetic Er 8-10 Mg/70ml Lqcr (Chlorpheniramine-Hydrocodone) .... Take 5cc Two Times A Day As Needed 13)  Promethazine Hcl 25 Mg Tabs (Promethazine Hcl) .... One Tab By Mouth Two Times A Day Prn 14)  Advair Diskus 100-50 Mcg/dose Misc (Fluticasone-Salmeterol) .... Inhale One Puff By Mouth Two Times A Day 15)  Lanoxin 0.25 Mg Tabs (Digoxin) .... One Tab By Mouth Qd 16)  Folic Acid 1 Mg Tabs (Folic Acid) .... Take 1 Tablet By Mouth Once A Day 17)  Restoril 15 Mg Caps (Temazepam) .... Take 1 Tab By Mouth At Bedtime 18)  Amrix 15 Mg Xr24h-Cap (Cyclobenzaprine Hcl) .... Take 1 Tablet By Mouth Once A Day 19)  Coumadin 10 Mg Tabs (Warfarin Sodium) .... Take 1 Tablet By Mouth Once A Day Except On Thursday 13 Mg 20)  Cyclobenzaprine Hcl 10 Mg Tabs (Cyclobenzaprine Hcl) .... Take 1 Tab By Mouth At Bedtime 21)  Diflucan 150 Mg Tabs (Fluconazole) .... Take 1 Tablet By Mouth Once A Day  Allergies (verified): 1)  ! Pcn 2)  ! * Niaspan  Review of  Systems General:  Denies chills and fever. Eyes:  Denies blurring and discharge. CV:  Complains of palpitations; denies chest pain or discomfort and swelling of hands. Resp:  Denies cough and sputum productive. GI:  Complains of nausea; denies abdominal pain, constipation, diarrhea, and vomiting; ocassionally, she has gallstones and is ready for surgical consultation. GU:  Denies dysuria and urinary frequency. MS:  Complains of joint pain and stiffness; chronic esp the knees, shegets intra-articular injections on the R failrly often. Derm:  Denies itching and rash. Psych:  Denies anxiety and depression. Endo:  Denies cold intolerance, excessive hunger, excessive thirst, excessive urination, heat intolerance, polyuria,  and weight change.  Physical Exam  General:  alert, well-hydrated, and overweight-appearing. HEENT: No facial asymmetry,  EOMI, No sinus tenderness, TM's Clear, oropharynx  pink and moist.   Chest: Clear to auscultation bilaterally.  CVS: S1, S2, systolic  murmurs, positive click  Abd: Soft, Nontender.  MS: DECREASED  ROM spine,  and knees.  Ext: No edema.   CNS: CN 2-12 intact, power tone and sensation normal throughout.   Skin: Intact, no visible lesions or rashes.  Psych: Good eye contact, normal affect.  Memory intact, not anxious or depressed appearing.      Impression & Recommendations:  Problem # 1:  VARICOSE VEINS LOWER EXTREMITIES W/OTH COMPS (ICD-454.8) Assessment Deteriorated  Orders: Vascular Clinic (Vascular)  Problem # 2:  CHOLELITHIASIS (ICD-574.20) Assessment: Unchanged  Orders: Surgical Referral (Surgery)  Problem # 3:  DIABETES MELLITUS, TYPE II (ICD-250.00) Assessment: Improved  Her updated medication list for this problem includes:    Glipizide 5 Mg Tabs (Glipizide) ..... One tab by mouth once daily  Labs Reviewed: Creat: 1.90 (07/27/2008)    Reviewed HgBA1c results: 6.4 (11/10/2008)  6.5 (06/10/2008)  Orders: Hemoglobin A1C (83036)6.3  Problem # 4:  HYPERLIPIDEMIA (ICD-272.4) Assessment: Comment Only  Her updated medication list for this problem includes:    Vytorin 10-20 Mg Tabs (Ezetimibe-simvastatin) ..... One tab by mouth at bedtime  Orders: T-Lipid Profile [phone]) T-Hepatic Function [phone])  Labs Reviewed: SGOT: 20 (07/27/2008)   SGPT: 10 (07/27/2008)   HDL:38 (07/27/2008), 30 (02/19/2008)  LDL:46 (07/27/2008), 42 (02/19/2008)  Chol:103 (07/27/2008), 108 (02/19/2008)  Trig:95 (07/27/2008), 179 (02/19/2008)  Problem # 5:  GERD (ICD-530.81) Assessment: Unchanged  Her updated medication list for this problem includes:    Aciphex 20 Mg Tbec (Rabeprazole sodium) ..... One tab by mouth once daily  Complete Medication  List: 1)  Bidil 20-37.5 Mg Tabs (Isosorb dinitrate-hydralazine) .... One tab by mouth three times a day 2)  Coumadin 5 Mg Tabs (Warfarin sodium) .... Take 1 tablet by mouth three times a day 3)  Synthroid 75 Mcg Tabs (Levothyroxine sodium) .... One tab by mouth once daily 4)  Lasix 40 Mg Tabs (Furosemide) .... One tab by mouth once daily 5)  Aciphex 20 Mg Tbec (Rabeprazole sodium) .... One tab by mouth once daily 6)  Calcium 600 1500 Mg Tabs (Calcium carbonate) .... One tab by mouth once daily 7)  Toprol Xl 100 Mg Tb24 (Metoprolol succinate) .... One tab by mouth once daily 8)  Hydrocodone-acetaminophen 5-325 Mg Tabs (Hydrocodone-acetaminophen) .... One tab by mouth once daily 9)  Klor-con M20 20 Meq Tbcr (Potassium chloride crys cr) .... One and half tabs by mouth once daily 10)  Glipizide 5 Mg Tabs (Glipizide) .... One tab by mouth once daily 11)  Vytorin 10-20 Mg Tabs (Ezetimibe-simvastatin) .... One tab by mouth at bedtime 12)  Tussionex Pennkinetic Er 8-10 Mg/12ml  Lqcr (Chlorpheniramine-hydrocodone) .... Take 5cc two times a day as needed 13)  Promethazine Hcl 25 Mg Tabs (Promethazine hcl) .... One tab by mouth two times a day prn 14)  Advair Diskus 100-50 Mcg/dose Misc (Fluticasone-salmeterol) .... Inhale one puff by mouth two times a day 15)  Lanoxin 0.25 Mg Tabs (Digoxin) .... One tab by mouth qd 16)  Folic Acid 1 Mg Tabs (Folic acid) .... Take 1 tablet by mouth once a day 17)  Restoril 15 Mg Caps (Temazepam) .... Take 1 tab by mouth at bedtime 18)  Amrix 15 Mg Xr24h-cap (Cyclobenzaprine hcl) .... Take 1 tablet by mouth once a day 19)  Coumadin 10 Mg Tabs (Warfarin sodium) .... Take 1 tablet by mouth once a day except on thursday 13 mg 20)  Cyclobenzaprine Hcl 10 Mg Tabs (Cyclobenzaprine hcl) .... Take 1 tab by mouth at bedtime 21)  Diflucan 150 Mg Tabs (Fluconazole) .... Take 1 tablet by mouth once a day  Other Orders: T-CBC No Diff ([id number]) T-Basic Metabolic Panel ([id number])  T-TSH [id number]) Radiology Referral (Radiology)  Patient Instructions: 1)  Please schedule a follow-up appointment in 3 months. 2)  It is important that you exercise regularly at least 60 minutes 7 times a week. If you develop chest pain, have severe difficulty breathing, or feel very tired , stop exercising immediately and seek medical attention. 3)  You need to lose weight. Consider a lower calorie diet and regular exercise. cONGRATS on thwe 10 pound weight loss in the past year, keep it up pls. 4)  PLANT 2 pretty flowers, I want to get a bud the next time yu come.Marland Kitchen 5)  Check your feet each night for sore areas, calluses or signs of infection. 6)  dexa will be scheduled  7)  You will be referred to Dr. Donnetta Hutching. 8)  You will be referred to to Dr. Arnoldo Morale about your gall baldder. Prescriptions: VYTORIN 10-20 MG  TABS (EZETIMIBE-SIMVASTATIN) one tab by mouth at bedtime  #49 x 0   Entered by:   Kate Sable   Authorized by:   [name] MD   Signed by:   [name] MD on 02/09/2009   Method used:   Samples Given   RxID:   [id number]      Appended Document: FOLLOW UP pls insert HBA1C value in note  Appended Document: FOLLOW UP    Clinical Lists Changes  Medications: Rx of VYTORIN 10-20 MG  TABS (EZETIMIBE-SIMVASTATIN) one tab by mouth at bedtime;  #90 x 3;  Signed;  Entered by: Kate Sable;  Authorized by: [name] MD;  Method used: Handwritten Orders: Added new Service order of Hemoglobin A1C [id number]) - Signed Observations: Added new observation of HGBA1C: 6.5 % (02/11/2009 9:50)    Prescriptions: VYTORIN 10-20 MG  TABS (EZETIMIBE-SIMVASTATIN) one tab by mouth at bedtime  #90 x 3   Entered by:   Kate Sable   Authorized by:   [name] MD   Signed by:   Kate Sable on 02/11/2009   Method used:   Handwritten   [id number]    Laboratory Results   Blood Tests   Date/Time Received: Feb 11, 2009  Date/Time Reported: Feb 11, 2009 1pm  HGBA1C: 6.5%   (Normal Range: Non-Diabetic - 3-6%   Control Diabetic - 6-8%)

## 2010-10-25 NOTE — Letter (Signed)
Summary: Discharge Summary  Discharge Summary   Imported By: Dierdre Harness 12/28/2009 10:17:50  _____________________________________________________________________  External Attachment:    Type:   Image     Comment:   External Document

## 2010-10-25 NOTE — Medication Information (Signed)
Summary: Visual merchandiser   Imported By: Dierdre Harness 08/27/2009 [id number]  _____________________________________________________________________  External Attachment:    Type:   Image     Comment:   External Document

## 2010-10-25 NOTE — Assessment & Plan Note (Signed)
Summary: FOLLOW UP FROM HOS   Vital Signs:  Patient profile:   77 year old female Menstrual status:  postmenopausal Height:      66.5 inches Weight:      251 pounds BMI:     40.05 O2 Sat:      96 % Pulse rate:   73 / minute Pulse rhythm:   regular Resp:     16 per minute BP sitting:   123 / 73 Cuff size:   large  Vitals Entered By: Kate Sable (August 02, 2009 11:43 AM)  Nutrition Counseling: Patient's BMI is greater than 25 and therefore counseled on weight management options. CC: Follow up chronic problems, still coughing some. Producing a small amount of phlegm   CC:  Follow up chronic problems and still coughing some. Producing a small amount of phlegm.  History of Present Illness: Pt was in the Ed 2 weeks ago with cough, no script given , treated through urgent care had as 10 day course of antibiotic and needs f/u x ray, I will get report from Dr. Jomarie Longs and do f/u CXr  .  Also  she has a Aug 09, 2009, appt with Dr. Margot Chimes for mx of a left intraductal papiloma, will eeed to contact her cardiologist re coumadin mx, letter will be sent today  She denies any recent fever or chills, she still reports a cough which is primarily non producvtive. She denies polyuria , polydypsia, blurred vision or hypoglycemic episodes. Fasting sugars a re seldom over 130. She denies depression or uncontrolled anxiety.  Allergies: 1)  ! Pcn 2)  ! * Niaspan  Review of Systems      See HPI General:  See HPI. ENT:  Complains of postnasal drainage; denies earache, sinus pressure, and sore throat. CV:  Denies chest pain or discomfort, palpitations, and swelling of hands. Resp:  Complains of cough; denies shortness of breath, sputum productive, and wheezing. GI:  Denies abdominal pain, constipation, diarrhea, nausea, and vomiting. GU:  Denies dysuria and urinary frequency. MS:  Complains of joint pain, low back pain, muscle aches, and stiffness. Derm:  Denies itching and rash. Neuro:  Denies  headaches and seizures. Psych:  See HPI. Endo:  Denies cold intolerance, excessive hunger, excessive thirst, excessive urination, heat intolerance, polyuria, and weight change.  Physical Exam  General:  alert, well-hydrated, and overweight-appearing. HEENT: No facial asymmetry,  EOMI, No sinus tenderness, TM's Clear, oropharynx  pink and moist.outer ear canl erythematous   Chest: Clear to auscultation bilaterally.  CVS: S1, S2, systolic murmurs, No S3. positveclick.  Abd: Soft, Nontender.  [id number] ROM spine, hips, shoulders and knees.  Ext: No edema.   CNS: CN 2-12 intact, power tone and sensation normal throughout.   Skin: Intact, no visible lesions or rashes.  Psych: Good eye contact, normal affect.  Memory intact, not anxious or depressed appearing.      Impression & Recommendations:  Problem # 1:  BRONCHITIS, CHRONIC (ICD-491.9) Assessment Improved  Orders: CXR- 2view (CXR) Radiology Referral (Radiology)  Problem # 2:  DIABETES MELLITUS, TYPE II (ICD-250.00) Assessment: Improved  Her updated medication list for this problem includes:    Glipizide 5 Mg Tabs (Glipizide) ..... One tab by mouth once daily    Losartan Potassium-hctz 50-12.5 Mg Tabs (Losartan potassium-hctz) .Marland Kitchen... Take 1 tablet by mouth once a day  Orders: T-Urine Microalbumin w/creat. ratio [phone]) Glucose, (CBG) (82962) Hemoglobin A1C (83036)  Labs Reviewed: Creat: 1.69 (06/23/2009)    Reviewed HgBA1c results: 6.5 (  08/02/2009)  6.6 (05/19/2009)  Problem # 3:  HYPERTENSION (ICD-401.9) Assessment: Unchanged  Her updated medication list for this problem includes:    Lasix 40 Mg Tabs (Furosemide) ..... One tab by mouth once daily    Toprol Xl 100 Mg Tb24 (Metoprolol succinate) ..... One tab by mouth once daily    Losartan Potassium-hctz 50-12.5 Mg Tabs (Losartan potassium-hctz) .Marland Kitchen... Take 1 tablet by mouth once a day  Orders: T-Basic Metabolic Panel (99991111)  BP today: 123/73  Prior BP: 126/70 (06/29/2009)  Labs Reviewed: K+: 4.6 (06/23/2009) Creat: : 1.69 (06/23/2009)   Chol: 117 (06/23/2009)   HDL: 35 (06/23/2009)   LDL: 55 (06/23/2009)   TG: 135 (06/23/2009)  Problem # 4:  HYPOTHYROIDISM (ICD-244.9) Assessment: Unchanged  Her updated medication list for this problem includes:    Synthroid 75 Mcg Tabs (Levothyroxine sodium) ..... One tab by mouth once daily  Orders: T-TSH [id number])  Labs Reviewed: TSH: 1.787 (06/23/2009)    HgBA1c: 6.5 (08/02/2009) Chol: 117 (06/23/2009)   HDL: 35 (06/23/2009)   LDL: 55 (06/23/2009)   TG: 135 (06/23/2009)  Problem # 5:  OBESITY (ICD-278.00) Assessment: Unchanged  Ht: 66.5 (08/02/2009)   Wt: 251 (08/02/2009)   BMI: 40.05 (08/02/2009)  Complete Medication List: 1)  Bidil 20-37.5 Mg Tabs (Isosorb dinitrate-hydralazine) .... One tab by mouth three times a day 2)  Coumadin 5 Mg Tabs (Warfarin sodium) .... Take 1 tablet by mouth three times a day 3)  Synthroid 75 Mcg Tabs (Levothyroxine sodium) .... One tab by mouth once daily 4)  Lasix 40 Mg Tabs (Furosemide) .... One tab by mouth once daily 5)  Omeprazole 40 Mg Cpdr (Omeprazole) .... Take 1 tablet by mouth once a day 6)  Calcium 600 1500 Mg Tabs (Calcium carbonate) .... One tab by mouth once daily 7)  Toprol Xl 100 Mg Tb24 (Metoprolol succinate) .... One tab by mouth once daily 8)  Hydrocodone-acetaminophen 5-325 Mg Tabs (Hydrocodone-acetaminophen) .... One tab by mouth once daily 9)  Klor-con M20 20 Meq Tbcr (Potassium chloride crys cr) .... One and half tabs by mouth once daily 10)  Glipizide 5 Mg Tabs (Glipizide) .... One tab by mouth once daily 11)  Vytorin 10-20 Mg Tabs (Ezetimibe-simvastatin) .... One tab by mouth at bedtime 12)  Tussionex Pennkinetic Er 8-10 Mg/83ml Lqcr (Chlorpheniramine-hydrocodone) .... Take 5cc two times a day as needed 13)  Promethazine Hcl 25 Mg Tabs (Promethazine hcl) .... One tab by mouth two times a day prn 14)  Advair Diskus 100-50  Mcg/dose Misc (Fluticasone-salmeterol) .... Inhale one puff by mouth two times a day 15)  Lanoxin 0.25 Mg Tabs (Digoxin) .... One tab by mouth qd 16)  Folic Acid 1 Mg Tabs (Folic acid) .... Take 1 tablet by mouth once a day 17)  Restoril 15 Mg Caps (Temazepam) .... Take 1 tab by mouth at bedtime 18)  Amrix 15 Mg Xr24h-cap (Cyclobenzaprine hcl) .... Take 1 tablet by mouth once a day 19)  Coumadin 10 Mg Tabs (Warfarin sodium) .... Take 1 tablet by mouth once a day except on thursday 13 mg 20)  Cyclobenzaprine Hcl 10 Mg Tabs (Cyclobenzaprine hcl) .... Take 1 tab by mouth at bedtime 21)  Diflucan 150 Mg Tabs (Fluconazole) .... Take 1 tablet by mouth once a day 22)  Ciprofloxacin Hcl 500 Mg Tabs (Ciprofloxacin hcl) .... Take 1 tablet by mouth two times a day 23)  Duoneb 0.5-2.5 (3) Mg/77ml Soln (Ipratropium-albuterol) .... Take 1 tablet by mouth three times a day 24)  Losartan Potassium-hctz 50-12.5 Mg Tabs (Losartan potassium-hctz) .... Take 1 tablet by mouth once a day 25)  Gabapentin 100 Mg Caps (Gabapentin) .... One to three capsules at  night for leg pain and burning 26)  Flucinolone Otic  .... 2 drops to affected ear two times a day x 1 week 27)  Tamiflu 75 Mg Caps (Oseltamivir phosphate) .... Take 1 tablet by mouth two times a day 28)  Tessalon Perles 100 Mg Caps (Benzonatate) .... Take 1 tablet by mouth three times a day  Other Orders: T-Hepatic Function [id number]) T-Lipid Profile [phone])  Patient Instructions: 1)  Please schedule a follow-up appointment in 2 months. 2)  It is important that you exercise regularly at least 20 minutes 5 times a week. If you develop chest pain, have severe difficulty breathing, or feel very tired , stop exercising immediately and seek medical attention. 3)  You need to lose weight. Consider a lower calorie diet and regular exercise.  4)  Check your blood sugars regularly. If your readings are usually above : or below 70 you should contact our office.  5)  I will contact Dr Rollene Fare about your upcoming surgery. 6)  i will contact Dr Jomarie Longs Prescriptions: PROMETHAZINE HCL 25 MG TABS (PROMETHAZINE HCL) one tab by mouth two times a day prn  #40 x 3   Entered by:   Jimmey Ralph LPN   Authorized by:   [name] MD   Signed by:   Jimmey Ralph LPN on [id number]   Method used:   Electronically to        [facility] (retail)       [address]       [place], [place]  [address]       Ph: [phone]       Fax: [phone]   RxID:   [id number]   Laboratory Results   Blood Tests   Date/Time Received: 08/02/09 Date/Time Reported: 08/02/09  Glucose (random): 93 mg/dL   (Normal Range: 70-105) HGBA1C: 6.5%   (Normal Range: Non-Diabetic - 3-6%   Control Diabetic - 6-8%)

## 2010-10-25 NOTE — Assessment & Plan Note (Signed)
Summary: office visit   Vital Signs:  Patient profile:   77 year old female Menstrual status:  postmenopausal Height:      66.5 inches Weight:      256.50 pounds BMI:     40.93 O2 Sat:      93 % Pulse rate:   88 / minute Pulse rhythm:   regular Resp:     16 per minute BP sitting:   118 / 74  (left arm) Cuff size:   large  Vitals Entered By: Kate Sable LPN (June 27, 624THL 579FGE PM)  Nutrition Counseling: Patient's BMI is greater than 25 and therefore counseled on weight management options. CC: Follow up chronic problems   Primary Care Provider:  [name] MD  CC:  Follow up chronic problems.  History of Present Illness: Pt reports that she just has not been feeling well. She reports increased fatigue, with poor exercise tolerance and very little energy. She no longer attempts regular exercise, her weight is unchanged, her joints hurt alot. She also states her apetitie is not as good as it has been in the past, and she does have chronic ausea from established gall bladder disease.Becuse of her surgical risk, to date elective surgery has been put off, and she clearly is eating sufficient. She also has an abn mamogram due to presumed benign ds, she presented with unilateral bloody nipple d/c , she is being followed by a breast surgeon for this. She denies polyuria, polydypsia, blurred vision or hypoglycemic episodes, she does test her sugars regularly.  Current Medications (verified): 1)  Bidil 20-37.5 Mg  Tabs (Isosorb Dinitrate-Hydralazine) .... One Tab By Mouth Three Times A Day 2)  Coumadin 5 Mg Tabs (Warfarin Sodium) .... Take 1 Tablet By Mouth Three Times A Day 3)  Synthroid 75 Mcg  Tabs (Levothyroxine Sodium) .... One Tab By Mouth Once Daily 4)  Lasix 40 Mg  Tabs (Furosemide) .... One Tab By Mouth Once Daily 5)  Oscal 500/200 D-3 500-200 Mg-Unit Tabs (Calcium-Vitamin D) .... Take 1 Tablet By Mouth Three Times A Day 6)  Toprol Xl 100 Mg  Tb24 (Metoprolol Succinate) ....  One Tab By Mouth Once Daily 7)  Hydrocodone-Acetaminophen 5-325 Mg  Tabs (Hydrocodone-Acetaminophen) .... One Tab By Mouth Once Daily 8)  Klor-Con M20 20 Meq  Tbcr (Potassium Chloride Crys Cr) .... One and Half Tabs By Mouth Once Daily 9)  Glipizide 5 Mg  Tabs (Glipizide) .... One Tab By Mouth Once Daily 10)  Vytorin 10-20 Mg  Tabs (Ezetimibe-Simvastatin) .... One Tab By Mouth At Bedtime 11)  Advair Diskus 100-50 Mcg/dose Misc (Fluticasone-Salmeterol) .... Inhale One Puff By Mouth Two Times A Day 12)  Lanoxin 0.25 Mg Tabs (Digoxin) .... One Tab By Mouth Qd 13)  Folic Acid 1 Mg Tabs (Folic Acid) .... Take 1 Tablet By Mouth Once A Day 14)  Restoril 15 Mg Caps (Temazepam) .... Take 1 Tab By Mouth At Bedtime 15)  Coumadin 10 Mg Tabs (Warfarin Sodium) .... Take 1 Tablet By Mouth Once A Day Except On Thursday 13 Mg 16)  Gabapentin 100 Mg Caps (Gabapentin) .... One To Three Capsules At  Night For Leg Pain and Burning 17)  Promethazine Hcl 25 Mg/ml Soln (Promethazine Hcl) .... Take 1 Tablet By Mouth One  Time A Day As Needed 18)  Oxycodone-Acetaminophen 5-325 Mg Tabs (Oxycodone-Acetaminophen) .... Take 1 Tablet By Mouth Once A Day  Allergies (verified): 1)  ! Pcn 2)  ! * Niaspan  Review of Systems  See HPI General:  Complains of fatigue, sleep disorder, and weakness; denies chills and fever; sleeps on avg 5 hrs, has easy awakenings. Eyes:  Denies discharge, eye pain, and red eye. ENT:  Denies hoarseness, nasal congestion, sinus pressure, and sore throat. CV:  Complains of fatigue, shortness of breath with exertion, and swelling of feet; denies chest pain or discomfort, difficulty breathing while lying down, and swelling of hands. Resp:  Complains of cough and sputum productive; chronic, white sputum, primarily early morning. GI:  Denies abdominal pain, constipation, diarrhea, nausea, and vomiting. GU:  Complains of urinary frequency; denies dysuria and hematuria; 3 day history. MS:  Complains  of low back pain; low back pain radiating down legs relieved by gabapentin. Derm:  Denies itching, lesion(s), and rash. Neuro:  Denies headaches, seizures, and sensation of room spinning. Psych:  Complains of anxiety; denies depression, mental problems, sense of great danger, suicidal thoughts/plans, thoughts of violence, and unusual visions or sounds; mild chronic anxiety. Endo:  Denies excessive hunger, excessive thirst, and excessive urination. Heme:  Denies abnormal bruising and bleeding; pt on chronic coumadin , monitored by cardiology. Allergy:  Complains of seasonal allergies; denies hives or rash and itching eyes.  Physical Exam  General:  Well-developed, obese,in no acute distress; alert,appropriate and cooperative throughout examination HEENT: No facial asymmetry,  EOMI, No sinus tenderness, TM's Clear, oropharynx  pink and moist.   Chest: Clear to auscultation bilaterally.  CVS: S1, S2, systolic  murmur, No S3. Positive click.  Abd: Soft, Nontender.  [id number]  ROM spine, hips, shoulders and knees.  Ext: No edema.   CNS: CN 2-12 intact, power tone and sensation normal throughout.   Skin: Intact, no visible lesions or rashes.  Psych: Good eye contact, normal affect.  Memory intact, not anxious or depressed appearing.    Impression & Recommendations:  Problem # 1:  ACUTE CYSTITIS (ICD-595.0) Assessment Comment Only  The following medications were removed from the medication list:    Ciprofloxacin Hcl 500 Mg Tabs (Ciprofloxacin hcl) .Marland Kitchen... Take 1 tablet by mouth two times a day  Orders: T-Urinalysis [id number]),  Encouraged to push clear liquids, get enough rest, and take acetaminophen as needed. To be seen in 10 days if no improvement, sooner if worse.  Problem # 2:  UNSPECIFIED CARDIOVASCULAR DISEASE (ICD-429.2) Assessment: Unchanged  The following medications were removed from the medication list:    Losartan Potassium-hctz 50-12.5 Mg Tabs (Losartan  potassium-hctz) .Marland Kitchen... Take 1 tablet by mouth once a day Her updated medication list for this problem includes:    Lasix 40 Mg Tabs (Furosemide) ..... One tab by mouth once daily    Toprol Xl 100 Mg Tb24 (Metoprolol succinate) ..... One tab by mouth once daily followed by cardiology, and has a new provider locally  Problem # 3:  DIABETES MELLITUS, TYPE II (ICD-250.00) Assessment: Unchanged  The following medications were removed from the medication list:    Losartan Potassium-hctz 50-12.5 Mg Tabs (Losartan potassium-hctz) .Marland Kitchen... Take 1 tablet by mouth once a day Her updated medication list for this problem includes:    Glipizide 5 Mg Tabs (Glipizide) ..... One tab by mouth once daily  Orders: T- Hemoglobin A1C [id number]) T- Hemoglobin A1C [id number])  Labs Reviewed: Creat: 1.86 (02/22/2010)    Reviewed HgBA1c results: 6.7 (12/23/2009)  6.5 (08/02/2009)  Problem # 4:  HYPERTENSION (ICD-401.9) Assessment: Unchanged  The following medications were removed from the medication list:    Losartan Potassium-hctz 50-12.5 Mg Tabs (Losartan potassium-hctz) .Marland Kitchen... Take 1 tablet  by mouth once a day Her updated medication list for this problem includes:    Lasix 40 Mg Tabs (Furosemide) ..... One tab by mouth once daily    Toprol Xl 100 Mg Tb24 (Metoprolol succinate) ..... One tab by mouth once daily  Orders: T-Basic Metabolic Panel (99991111)  BP today: 118/74 Prior BP: 110/50 (01/10/2010)  Labs Reviewed: K+: 4.5 (02/22/2010) Creat: : 1.86 (02/22/2010)   Chol: 101 (02/22/2010)   HDL: 30 (02/22/2010)   LDL: 40 (02/22/2010)   TG: 155 (02/22/2010)  Problem # 5:  HYPERLIPIDEMIA (ICD-272.4) Assessment: Unchanged  Her updated medication list for this problem includes:    Vytorin 10-20 Mg Tabs (Ezetimibe-simvastatin) ..... One tab by mouth at bedtime, no med change  Labs Reviewed: SGOT: 23 (02/22/2010)   SGPT: 12 (02/22/2010)   HDL:30 (02/22/2010), 31 (09/23/2009)  LDL:40  (02/22/2010), 56 (09/23/2009)  Chol:101 (02/22/2010), 114 (09/23/2009)  Trig:155 (02/22/2010), 133 (09/23/2009)  Problem # 6:  GERD (ICD-530.81) Assessment: Deteriorated  The following medications were removed from the medication list:    Omeprazole 40 Mg Cpdr (Omeprazole) .Marland Kitchen... Take 1 tablet by mouth once a day  Problem # 7:  DEGENERATIVE JOINT DISEASE, SPINE (ICD-721.90) Assessment: Deteriorated good response to gabapentin reportedly  Complete Medication List: 1)  Bidil 20-37.5 Mg Tabs (Isosorb dinitrate-hydralazine) .... One tab by mouth three times a day 2)  Coumadin 5 Mg Tabs (Warfarin sodium) .... Take 1 tablet by mouth three times a day 3)  Synthroid 75 Mcg Tabs (Levothyroxine sodium) .... One tab by mouth once daily 4)  Lasix 40 Mg Tabs (Furosemide) .... One tab by mouth once daily 5)  Oscal 500/200 D-3 500-200 Mg-unit Tabs (Calcium-vitamin d) .... Take 1 tablet by mouth three times a day 6)  Toprol Xl 100 Mg Tb24 (Metoprolol succinate) .... One tab by mouth once daily 7)  Hydrocodone-acetaminophen 5-325 Mg Tabs (Hydrocodone-acetaminophen) .... One tab by mouth once daily 8)  Klor-con M20 20 Meq Tbcr (Potassium chloride crys cr) .... One and half tabs by mouth once daily 9)  Glipizide 5 Mg Tabs (Glipizide) .... One tab by mouth once daily 10)  Vytorin 10-20 Mg Tabs (Ezetimibe-simvastatin) .... One tab by mouth at bedtime 11)  Advair Diskus 100-50 Mcg/dose Misc (Fluticasone-salmeterol) .... Inhale one puff by mouth two times a day 12)  Lanoxin 0.25 Mg Tabs (Digoxin) .... One tab by mouth qd 13)  Folic Acid 1 Mg Tabs (Folic acid) .... Take 1 tablet by mouth once a day 14)  Restoril 15 Mg Caps (Temazepam) .... Take 1 tab by mouth at bedtime 15)  Coumadin 10 Mg Tabs (Warfarin sodium) .... Take 1 tablet by mouth once a day except on thursday 13 mg 16)  Gabapentin 100 Mg Caps (Gabapentin) .... One to three capsules at  night for leg pain and burning 17)  Promethazine Hcl 25 Mg/ml Soln  (Promethazine hcl) .... Take 1 tablet by mouth one  time a day as needed 18)  Oxycodone-acetaminophen 5-325 Mg Tabs (Oxycodone-acetaminophen) .... Take 1 tablet by mouth once a day  Other Orders: T-TSH [id number])  Patient Instructions: 1)  Please schedule a follow-up appointment in 3.5 months. 2)  It is important that you exercise regularly at least 20 minutes 5 times a week. If you develop chest pain, have severe difficulty breathing, or feel very tired , stop exercising immediately and seek medical attention. 3)  You need to lose weight. Consider a lower calorie diet and regular exercise.  4)  HBA1C on July 5  or after. 5)  Pls start getting on your bike regularly Prescriptions: TOPROL XL 100 MG  TB24 (METOPROLOL SUCCINATE) one tab by mouth once daily  #90 x 2   Entered by:   Kate Sable LPN   Authorized by:   [name] MD   Signed by:   Kate Sable LPN on [id number]   Method used:   Electronically to        [facility] (mail-order)       [place]  [address]       Ph: [phone]       Fax: [phone]   [id number]

## 2010-10-25 NOTE — Assessment & Plan Note (Signed)
Summary: FLU SHOT  Nurse Visit    Prior Medications: DIGOXIN 0.25 MG  TABS (DIGOXIN) one tab by mouth once daily BIDIL 20-37.5 MG  TABS (ISOSORB DINITRATE-HYDRALAZINE) one tab by mouth three times a day COUMADIN 5 MG  TABS (WARFARIN SODIUM) UAD SYNTHROID 75 MCG  TABS (LEVOTHYROXINE SODIUM) one tab by mouth once daily LASIX 40 MG  TABS (FUROSEMIDE) one tab by mouth once daily ACIPHEX 20 MG  TBEC (RABEPRAZOLE SODIUM) one tab by mouth once daily CALCIUM 600 1500 MG  TABS (CALCIUM CARBONATE) one tab by mouth once daily TOPROL XL 100 MG  TB24 (METOPROLOL SUCCINATE) one tab by mouth once daily HYDROCODONE-ACETAMINOPHEN 5-325 MG  TABS (HYDROCODONE-ACETAMINOPHEN) one tab by mouth once daily KLOR-CON M20 20 MEQ  TBCR (POTASSIUM CHLORIDE CRYS CR) one and half tabs by mouth once daily GLIPIZIDE 5 MG  TABS (GLIPIZIDE) one tab by mouth once daily VYTORIN 10-20 MG  TABS (EZETIMIBE-SIMVASTATIN) one tab by mouth at bedtime IPRATROPIUM-ALBUTEROL 0.5-2.5 (3) MG/3ML SOLN (IPRATROPIUM-ALBUTEROL) Use 1 vial in nebulizer every 4 hours as needed Current Allergies: ! PCN ! * NIASPAN   Influenza Vaccine    Vaccine Type: Fluvax MCR    Site: right deltoid    Mfr: GlaxoSmithKline    Dose: 0.5 ml    Route: IM    Given by: Jimmey Ralph LPN    Exp. Date: 03/24/2008    Lot #: [id number]    VIS given: 04/18/07 version given June 22, 2008.   Orders Added: 1)  Influenza Vaccine MCR [id number]    ]

## 2010-10-25 NOTE — Cardiovascular Report (Signed)
Summary: ICD FOLLOW UP  ICD FOLLOW UP   Imported By: Dierdre Harness 01/03/2010 14:18:22  _____________________________________________________________________  External Attachment:    Type:   Image     Comment:   External Document

## 2010-10-25 NOTE — Assessment & Plan Note (Signed)
Summary: f up   Vital Signs:  Patient profile:   77 year old female Menstrual status:  postmenopausal Height:      66.5 inches Weight:      244.50 pounds BMI:     39.01 O2 Sat:      96 % on Room air Pulse rate:   89 / minute Pulse rhythm:   regular Resp:     16 per minute BP sitting:   118 / 60  Vitals Entered By: Louie Casa CMA (July 18, 2010 1:10 PM)  Nutrition Counseling: Patient's BMI is greater than 25 and therefore counseled on weight management options.  O2 Flow:  Room air CC: follow up Comments Did not bring meds   Primary Care Provider:  [name] MD  CC:  follow up.  History of Present Illness: Reports  that she has been doing farily well. She does fowever c/o fatigue, as well as poor sleep Denies recent fever or chills. Denies sinus pressure, nasal congestion , ear pain or sore throat. Denies chest congestion, or cough productive of sputum. Denies chest pain, palpitations, PND, orthopnea or leg swelling. Denies abdominal pain, vomitting, diarrhea or constipation.chronic nausea from gallstones Denies change in bowel movements or bloody stool. Denies dysuria , frequency, incontinence or hesitancy.  Denies headaches, vertigo, seizures. Denies depression, anxiety or insomnia. Denies  rash, lesions, or itch.     Allergies (verified): 1)  ! Pcn 2)  ! * Niaspan  Review of Systems      See HPI General:  Complains of fatigue and sleep disorder. Eyes:  Complains of vision loss-both eyes; denies discharge and eye pain. MS:  Complains of joint pain, low back pain, mid back pain, and stiffness. Endo:  Denies excessive thirst and excessive urination; tests daily and fasting sugars are less than 120. Heme:  Denies abnormal bruising and bleeding. Allergy:  Denies hives or rash and itching eyes.  Physical Exam  General:  Ill appearing elderly female in no C/P distress. HEENT: No facial asymmetry,  EOMI, No sinus tenderness, TM's Clear, oropharynx   pink and moist.   Chest: Clear to auscultation bilaterally. Decreased air entry throughout. CVS: S1, S2, No murmurs, No S3.   Abd: Soft, mild epigastric tenderness, no guarding or rebound MS: decreased  ROM spine, hips, shoulders and knees.  Ext: No edema.   CNS: CN 2-12 intact, power tone and sensation normal throughout.   Skin: Intact, no visible lesions or rashes.  Psych: Good eye contact, normal affect.  Memory intact,  anxious  appearing.    Impression & Recommendations:  Problem # 1:  INSOMNIA (ICD-780.52) Assessment Comment Only  The following medications were removed from the medication list:    Restoril 15 Mg Caps (Temazepam) .Marland Kitchen... Take 1 tab by mouth at bedtime Her updated medication list for this problem includes:    Temazepam 30 Mg Caps (Temazepam) .Marland Kitchen... Take 1 capsule by mouth at bedtime as needed for sleep  Orders: Medicare Electronic Prescription [phone])  Discussed sleep hygiene.   Problem # 2:  HYPERTENSION (ICD-401.9) Assessment: Comment Only  Her updated medication list for this problem includes:    Toprol Xl 100 Mg Tb24 (Metoprolol succinate) ..... One tab by mouth once daily    Furosemide 40 Mg Tabs (Furosemide) ..... One and one half tabs once daily Patient advised to follow low sodium diet rich in fruit and vegetables, and to commit to at least 30 minutes 5 days per week of regular exercise , to improve blood  presure control.   Orders: T-Basic Metabolic Panel ([id number])  BP today: 118/60 Prior BP: 116/70 (06/07/2010)  Labs Reviewed: K+: 5.1 (06/07/2010) Creat: : 2.61 (06/07/2010)   Chol: 101 (02/22/2010)   HDL: 30 (02/22/2010)   LDL: 40 (02/22/2010)   TG: 155 (02/22/2010)  Problem # 3:  HYPERLIPIDEMIA (ICD-272.4) Assessment: Comment Only  Her updated medication list for this problem includes:    Vytorin 10-20 Mg Tabs (Ezetimibe-simvastatin) ..... One tab by mouth at bedtime Low fat dietdiscussed and encouraged  Labs Reviewed: SGOT: 23  (02/22/2010)   SGPT: 12 (02/22/2010)   HDL:30 (02/22/2010), 31 (09/23/2009)  LDL:40 (02/22/2010), 56 (09/23/2009)  Chol:101 (02/22/2010), 114 (09/23/2009)  Trig:155 (02/22/2010), 133 (09/23/2009)  Problem # 4:  CHOLELITHIASIS (ICD-574.20) Assessment: Unchanged pt 's surgical risk oputweighs the benefit of elective surgery  Problem # 5:  DIABETES MELLITUS, TYPE II (ICD-250.00) Assessment: Comment Only  Her updated medication list for this problem includes:    Glipizide 5 Mg Tabs (Glipizide) ..... One tab by mouth once daily Patient advised to reduce carbs and sweets, commit to regular physical activity, take meds as prescribed, test blood sugars as directed, and attempt to lose weight , to improve blood sugar control.  Orders: T- Hemoglobin A1C [id number])  Labs Reviewed: Creat: 2.61 (06/07/2010)    Reviewed HgBA1c results: 6.5 (04/04/2010)  6.7 (12/23/2009)  Problem # 6:  OBESITY (ICD-278.00) Assessment: Improved  Ht: 66.5 (07/18/2010)   Wt: 244.50 (07/18/2010)   BMI: 39.01 (07/18/2010) therapeutic lifestyle change discussed and encouraged  Complete Medication List: 1)  Coumadin 5 Mg Tabs (Warfarin sodium) .... Take 1 tablet by mouth three times a day 2)  Synthroid 75 Mcg Tabs (Levothyroxine sodium) .... One tab by mouth once daily 3)  Oscal 500/200 D-3 500-200 Mg-unit Tabs (Calcium-vitamin d) .... Take 1 tablet by mouth three times a day 4)  Toprol Xl 100 Mg Tb24 (Metoprolol succinate) .... One tab by mouth once daily 5)  Hydrocodone-acetaminophen 5-325 Mg Tabs (Hydrocodone-acetaminophen) .... One tab by mouth once daily 6)  Klor-con M20 20 Meq Tbcr (Potassium chloride crys cr) .... One and half tabs by mouth once daily 7)  Glipizide 5 Mg Tabs (Glipizide) .... One tab by mouth once daily 8)  Vytorin 10-20 Mg Tabs (Ezetimibe-simvastatin) .... One tab by mouth at bedtime 9)  Advair Diskus 100-50 Mcg/dose Misc (Fluticasone-salmeterol) .... Inhale one puff by mouth two times a  day 10)  Lanoxin 0.25 Mg Tabs (Digoxin) .... One tab by mouth once daily. 11)  Folic Acid 1 Mg Tabs (Folic acid) .... Take 1 tablet by mouth once a day 12)  Coumadin 10 Mg Tabs (Warfarin sodium) .... Take 1 tablet by mouth once a day except on thursday 13 mg 13)  Gabapentin 100 Mg Caps (Gabapentin) .... One to three capsules at  night for leg pain and burning 14)  Promethazine Hcl 25 Mg/ml Soln (Promethazine hcl) .... Take 1 tablet by mouth one  time a day as needed 15)  Oxycodone-acetaminophen 5-325 Mg Tabs (Oxycodone-acetaminophen) .... Take 1 tablet by mouth once a day 16)  Furosemide 40 Mg Tabs (Furosemide) .... One and one half tabs once daily 17)  Bidil 20-37.5 Mg Tabs (Isosorb dinitrate-hydralazine) .... Take 1 tablet by mouth two times a day 18)  Sulfamethoxazole-tmp Ds 800-160 Mg Tabs (Sulfamethoxazole-trimethoprim) .... Take 1 two times a day x 10 days for sinus infection 19)  Promethazine Hcl 25 Mg Tabs (Promethazine hcl) .... One tab by mouth once daily as needed 20)  Temazepam  30 Mg Caps (Temazepam) .... Take 1 capsule by mouth at bedtime as needed for sleep 21)  Centrum Liqd (Multiple vitamins-minerals) .... One teaspoon twice daily  Other Orders: T-TSH [id number])  Patient Instructions: 1)  Please schedule a follow-up appointment in 3 months. 2)  You will be referred for overnight study at home to see if you need oxygen when you are sleeping 3)  New is a liquid vitamin. 4)  congrats on weight loss, pls keep it up and satrt regular activity 5)  HbgA1C prior to visit, ICD-9: 6)  TSH prior to visit, ICD-9: and chem 7 in 3 months Prescriptions: CENTRUM  LIQD (MULTIPLE VITAMINS-MINERALS) one teaspoon twice daily  #300 ml x 4   Entered and Authorized by:   [name] MD   Signed by:   [name] MD on 07/18/2010   Method used:   Electronically to        [facility] [address] (retail)       [place], [place]  [address]        Ph: [phone]       Fax: [phone]   RxID:   [id number] TEMAZEPAM 30 MG CAPS (TEMAZEPAM) Take 1 capsule by mouth at bedtime as needed for sleep  #30 x 2   Entered and Authorized by:   [name] MD   Signed by:   [name] MD on 07/18/2010   Method used:   Printed then faxed to ...       [facility] [address] (retail)       [address]       [place], [place]  [address]       Ph: [phone]       Fax: [phone]   RxID:   [id number]    Orders Added: 1)  Est. Patient Level IV [id number] 2)  Medicare Electronic Prescription [id number] 3)  T-Basic Metabolic Panel [id number] 4)  T- Hemoglobin A1C [[id number]] 5)  T-TSH [id number]

## 2010-10-25 NOTE — Progress Notes (Signed)
Summary: WATER PILLS  Phone Note Call from Patient   Summary of Call: NEEDS HER WATER PILLS  SHE TAKES 1.5 PILLS A DAY AND NEEDS IT FOR 90 DAY SUPPLY AND SEND TO [place] Initial call taken by: Dierdre Harness,  Feb 01, 2010 2:05 PM  Follow-up for Phone Call        sent in what was on her medlist for 1 once daily [facility] Follow-up by: Kate Sable LPN,  May 10, 624THL QA348G PM    Prescriptions: LASIX 40 MG  TABS (FUROSEMIDE) one tab by mouth once daily  #90 x 2   Entered by:   Kate Sable LPN   Authorized by:   [name] MD   Signed by:   Kate Sable LPN on 075-GRM   Method used:   Faxed to ...       [facility] (retail)             , [place]         Ph: [phone]       Fax: [phone]   RxID:   [id number]

## 2010-10-25 NOTE — Miscellaneous (Signed)
Summary: Refill  Clinical Lists Changes  Medications: Rx of LANOXIN 0.25 MG TABS (DIGOXIN) one tab by mouth qd;  #90 x 5;  Signed;  Entered by: Kate Sable;  Authorized by: [name] MD;  Method used: Printed then faxed to [facility] [address], [place], [place], [place]  [address], Ph: [phone], Fax: [phone]    Prescriptions: LANOXIN 0.25 MG TABS (DIGOXIN) one tab by mouth qd  #90 x 5   Entered by:   Kate Sable   Authorized by:   [name] MD   Signed by:   Kate Sable on 11/20/2008   Method used:   Printed then faxed to ...       [facility] (retail)       [address]       [place], [place]  [address]       Ph: [phone]       Fax: [phone]   RxID:   [id number]

## 2010-10-25 NOTE — Progress Notes (Signed)
Summary: rx   Phone Note Call from Patient   Summary of Call: has the flu and needs cough med and some clear pills. [facility] Initial call taken by: Lenn Cal,  July 16, 2009 8:32 AM  Follow-up for Phone Call        advise and erx tamiflu 75mg  Take 1 capsule by mouth two times a day #10 and tessalon perles 100mg  Take 1 capsule by mouth three times a day #21 pls doc her  symptoms also  Follow-up by: [name] MD,  July 16, 2009 2:27 PM  Additional Follow-up for Phone Call Additional follow up Details #1::        Started Tuesday night and got worse wednesday and thursday night and shes bringing up yellowish phlegm and nasal congestion. Maybe a low grade fever yesterday.  Advised [facility] in [facility]s [facility] Additional Follow-up by: Kate Sable,  July 16, 2009 2:58 PM    New/Updated Medications: TAMIFLU 75 MG CAPS (OSELTAMIVIR PHOSPHATE) Take 1 tablet by mouth two times a day TESSALON PERLES 100 MG CAPS (BENZONATATE) Take 1 tablet by mouth three times a day Prescriptions: TESSALON PERLES 100 MG CAPS (BENZONATATE) Take 1 tablet by mouth three times a day  #21 x 0   Entered by:   Kate Sable   Authorized by:   [name] MD   Signed by:   Kate Sable on 07/16/2009   Method used:   Electronically to        [facility] (retail)       [address]       [place], [place]  [address]       Ph: [phone]       Fax: [phone]   [id number] TAMIFLU 75 MG CAPS (OSELTAMIVIR PHOSPHATE) Take 1 tablet by mouth two times a day  #10 x 0   Entered by:   Kate Sable   Authorized by:   [name] MD   Signed by:   Kate Sable on 07/16/2009   Method used:   Electronically to        [facility] (retail)       [address]       [place], [place]  [address]       Ph: [phone]       Fax: [phone]   RxID:   [id number]

## 2010-10-25 NOTE — Op Note (Signed)
Summary: DEVICE REPLACEMENTS  DEVICE REPLACEMENTS   Imported By: Dierdre Harness 12/06/2009 15:58:28  _____________________________________________________________________  External Attachment:    Type:   Image     Comment:   External Document

## 2010-10-25 NOTE — Letter (Signed)
Summary: OVERNIGHT OXIMETRY  OVERNIGHT OXIMETRY   Imported By: Dierdre Harness 07/25/2010 09:54:10  _____________________________________________________________________  External Attachment:    Type:   Image     Comment:   External Document

## 2010-10-25 NOTE — Letter (Signed)
Summary: QUALIFY  FOR OXYGEN  QUALIFY  FOR OXYGEN   Imported By: Dierdre Harness 07/26/2010 09:29:53  _____________________________________________________________________  External Attachment:    Type:   Image     Comment:   External Document

## 2010-10-25 NOTE — Progress Notes (Signed)
Summary: THROAT  Phone Note Call from Patient   Summary of Call: THROAT SORE AND FLING COMING UP AND SORE ALL OVER FROM [address] Initial call taken by: Dierdre Harness,  August 24, 2008 9:27 AM  Follow-up for Phone Call        advise salt water gargles three times daily also rex and advise tessalon perles 100mg  Take 1 capsule by mouth three times a day #30 zero refill and dukes'magic mouthwash 10cc three times daily as needed x 300cc zero refill. If not allergic to codeine erx tussionex 5cc twice daily as needed x 200cc zero refill. Advise if fever , chills, green sputum needs OV Follow-up by: [name] MD,  August 24, 2008 9:35 PM  Additional Follow-up for Phone Call Additional follow up Details #1::        Patient aware and rx's sent Additional Follow-up by: Kate Sable,  August 25, 2008 11:47 AM    New/Updated Medications: TESSALON PERLES 100 MG CAPS (BENZONATATE) Take 1 tablet by mouth three times a day TUSSIONEX PENNKINETIC ER 8-10 MG/5ML LQCR (CHLORPHENIRAMINE-HYDROCODONE) Take 5cc two times a day as needed   Prescriptions: TUSSIONEX PENNKINETIC ER 8-10 MG/5ML LQCR (CHLORPHENIRAMINE-HYDROCODONE) Take 5cc two times a day as needed  #200 x 0   Entered by:   Kate Sable   Authorized by:   [name] MD   Signed by:   Kate Sable on 08/25/2008   Method used:   Printed then faxed to ...       [facility] (retail)       [address]       [place], [place]  [address]       Ph: [phone]       Fax: [phone]   RxID:   [id number] TESSALON PERLES 100 MG CAPS (BENZONATATE) Take 1 tablet by mouth three times a day  #30 x 0   Entered by:   Kate Sable   Authorized by:   [name] MD   Signed by:   Kate Sable on 08/25/2008   Method used:   Electronically to        [facility] (retail)       [address]       [place], [place]  [address]       Ph:  [phone]       Fax: [phone]   RxID:   [id number]

## 2010-10-25 NOTE — Progress Notes (Signed)
Summary: MEDICINE  Phone Note Call from Patient   Summary of Call: [facility] P[facility] SEND HER POTAS. [facility]ICINE Initial call taken by: Dierdre Harness,  August 22, 2010 9:04 AM    Prescriptions: KLOR-CON M20 20 MEQ  TBCR (POTASSIUM CHLORIDE CRYS CR) one and half tabs by mouth once daily  #135 x 0   Entered by:   Baldomero Lamy LPN   Authorized by:   [name] MD   Signed by:   Baldomero Lamy LPN on 075-GRM   Method used:   Faxed to ...       [facility] (retail)             , [place]         Ph: [phone]       Fax: [phone]   RxID:   [id number]

## 2010-10-25 NOTE — Progress Notes (Signed)
Summary: call  Phone Note Call from Patient   Summary of Call: needs [facility]   coumadin 5 mg not warfarin Initial call taken by: Dierdre Harness,  January 11, 2009 1:04 PM  Follow-up for Phone Call        Rx Called In Follow-up by: Jimmey Ralph LPN,  April 19, 624THL 1:28 PM      Prescriptions: COUMADIN 5 MG TABS (WARFARIN SODIUM) Take 1 tablet by mouth three times a day Brand medically necessary #20 x 0   Entered by:   Jimmey Ralph LPN   Authorized by:   [name] MD   Signed by:   Jimmey Ralph LPN on [id number]   Method used:   Electronically to        [facility] (retail)       [address]       [place], [place]  [address]       Ph: [phone]       Fax: [phone]   RxID:   [id number]

## 2010-10-25 NOTE — Assessment & Plan Note (Signed)
Summary: KIDNEY INFECTION  Nurse Visit   Allergies: 1)  ! Pcn 2)  ! * Niaspan Laboratory Results   Urine Tests  Date/Time Received: 04/09/09 Date/Time Reported: 04/09/09  Routine Urinalysis   Color: yellow Appearance: Clear Glucose: negative   (Normal Range: Negative) Bilirubin: negative   (Normal Range: Negative) Ketone: negative   (Normal Range: Negative) Spec. Gravity: 1.010   (Normal Range: 1.003-1.035) Blood: trace-lysed   (Normal Range: Negative) pH: 5.5   (Normal Range: 5.0-8.0) Protein: negative   (Normal Range: Negative) Urobilinogen: 0.2   (Normal Range: 0-1) Nitrite: negative   (Normal Range: Negative) Leukocyte Esterace: negative   (Normal Range: Negative)       Orders Added: 1)  UA Dipstick W/ Micro (manual) [81000]  Laboratory Results   Urine Tests    Routine Urinalysis   Color: yellow Appearance: Clear Glucose: negative   (Normal Range: Negative) Bilirubin: negative   (Normal Range: Negative) Ketone: negative   (Normal Range: Negative) Spec. Gravity: 1.010   (Normal Range: 1.003-1.035) Blood: trace-lysed   (Normal Range: Negative) pH: 5.5   (Normal Range: 5.0-8.0) Protein: negative   (Normal Range: Negative) Urobilinogen: 0.2   (Normal Range: 0-1) Nitrite: negative   (Normal Range: Negative) Leukocyte Esterace: negative   (Normal Range: Negative)

## 2010-10-25 NOTE — Miscellaneous (Signed)
Summary: med change  Clinical Lists Changes  Medications: Changed medication from FUROSEMIDE 40 MG/4ML SOLN (FUROSEMIDE) one and a half  tablets once daily to FUROSEMIDE 40 MG TABS (FUROSEMIDE) one and one half tabs once daily - Signed Rx of FUROSEMIDE 40 MG TABS (FUROSEMIDE) one and one half tabs once daily;  #135 x 2;  Signed;  Entered by: Kate Sable LPN;  Authorized by: [name] MD;  Method used: Printed then faxed to [facility] [address], [place], [place], [place]  [address], Ph: [phone], Fax: [phone]    Prescriptions: FUROSEMIDE 40 MG TABS (FUROSEMIDE) one and one half tabs once daily  #135 x 2   Entered by:   Kate Sable LPN   Authorized by:   [name] MD   Signed by:   Kate Sable LPN on [id number]   Method used:   Printed then faxed to ...       [facility] (retail)       [address]       [place], [place]  [address]       Ph: [phone]       Fax: [phone]   RxID:   [id number]

## 2010-10-25 NOTE — Progress Notes (Signed)
Summary: [facility]  [facility]   Imported By: Dierdre Harness 05/03/2010 14:12:19  _____________________________________________________________________  External Attachment:    Type:   Image     Comment:   External Document

## 2010-10-25 NOTE — Letter (Signed)
Summary: oxygen referral  oxygen referral   Imported By: Dierdre Harness 09/02/2010 16:08:30  _____________________________________________________________________  External Attachment:    Type:   Image     Comment:   External Document

## 2010-10-25 NOTE — Miscellaneous (Signed)
Summary: refills  Clinical Lists Changes  Medications: Rx of DIGOXIN 0.25 MG  TABS (DIGOXIN) one tab by mouth once daily;  #30 x 2;  Signed;  Entered by: Jimmey Ralph LPN;  Authorized by: [name] MD;  Method used: Electronically to [facility] [address] [place], [place], [place]  [address], Ph: [phone], Fax: [phone]    Prescriptions: DIGOXIN 0.25 MG  TABS (DIGOXIN) one tab by mouth once daily  #30 x 2   Entered by:   Jimmey Ralph LPN   Authorized by:   [name] MD   Signed by:   Jimmey Ralph LPN on [id number]   Method used:   Electronically to        [facility] (retail)       [address]       [place], [place]  [address]       Ph: [phone]       Fax: [phone]   RxID:   [id number]

## 2010-10-25 NOTE — Progress Notes (Signed)
Summary: eye exam  eye exam   Imported By: Dierdre Harness 12/17/2009 13:26:31  _____________________________________________________________________  External Attachment:    Type:   Image     Comment:   External Document

## 2010-10-25 NOTE — Progress Notes (Signed)
Summary: DR. Margot Chimes  DR. Margot Chimes   Imported By: Dierdre Harness 08/24/2009 08:25:37  _____________________________________________________________________  External Attachment:    Type:   Image     Comment:   External Document

## 2010-10-25 NOTE — Medication Information (Signed)
Summary: Visual merchandiser   Imported By: Dierdre Harness 08/17/2010 13:10:21  _____________________________________________________________________  External Attachment:    Type:   Image     Comment:   External Document

## 2010-10-25 NOTE — Progress Notes (Signed)
Summary: blood pressure meds  Phone Note Call from Patient   Summary of Call: pt has questions about blood pressure meds. [id number] Initial call taken by: Lenn Cal,  December 17, 2009 1:19 PM  Follow-up for Phone Call        Phone Call Completed Follow-up by: Baldomero Lamy LPN,  March 29, 624THL 10:24 AM

## 2010-10-25 NOTE — Progress Notes (Signed)
Summary: furosemide  Phone Note Call from Patient   Summary of Call: needs her furosemide 40 mg  [facility]  [phone]  /  [phone] Initial call taken by: Dierdre Harness,  December 08, 2009 10:42 AM    Prescriptions: LASIX 40 MG  TABS (FUROSEMIDE) one tab by mouth once daily  #90 x 1   Entered by:   Baldomero Lamy LPN   Authorized by:   [name] MD   Signed by:   Baldomero Lamy LPN on [id number]   Method used:   Faxed to ...       [facility] (retail)             , [place]         Ph: [phone]       Fax: [phone]   RxID:   [id number]

## 2010-10-25 NOTE — Progress Notes (Signed)
[facility]   Imported By: Dierdre Harness 02/14/2010 08:57:29  _____________________________________________________________________  External Attachment:    Type:   Image     Comment:   External Document

## 2010-10-25 NOTE — Progress Notes (Signed)
[facility]   Imported By: Dierdre Harness 04/15/2009 08:30:53  _____________________________________________________________________  External Attachment:    Type:   Image     Comment:   External Document

## 2010-10-25 NOTE — Letter (Signed)
[facility]   Imported By: Dierdre Harness 06/09/2010 08:13:35  _____________________________________________________________________  External Attachment:    Type:   Image     Comment:   External Document

## 2010-10-25 NOTE — Progress Notes (Signed)
Summary: [facility]  [facility]   Imported By: Dierdre Harness 03/23/2009 10:18:55  _____________________________________________________________________  External Attachment:    Type:   Image     Comment:   External Document

## 2010-10-25 NOTE — Miscellaneous (Signed)
Summary: labs, [place] add on  Clinical Lists Changes  Problems: Added new problem of ENCOUNTER FOR LONG-TERM USE OF OTHER MEDICATIONS (ICD-V58.69) Added new problem of UNSPECIFIED CARDIOVASCULAR DISEASE (ICD-429.2) Orders: Added new Test order of T-Basic Metabolic Panel (99991111) - Signed Added new Test order of T-BNP  (B Natriuretic Peptide) [phone]) - Signed

## 2010-10-25 NOTE — Miscellaneous (Signed)
  Clinical Lists Changes  Medications: Rx of LANOXIN 0.25 MG TABS (DIGOXIN) one tab by mouth qd;  #90 x 5;  Signed;  Entered by: Kate Sable;  Authorized by: [name] MD;  Method used: Printed then faxed to [facility] [address], [place], [place], [place]  [address], Ph: [phone], Fax: [phone]    Prescriptions: LANOXIN 0.25 MG TABS (DIGOXIN) one tab by mouth qd  #90 x 5   Entered by:   Kate Sable   Authorized by:   [name] MD   Signed by:   Kate Sable on 11/17/2008   Method used:   Printed then faxed to ...       [facility] (retail)       [address]       [place], [place]  [address]       Ph: [phone]       Fax: [phone]   RxID:   [id number]

## 2010-10-25 NOTE — Progress Notes (Signed)
Summary: ultra sound  Phone Note Call from Patient   Summary of Call: pt wants to know if she has had a ultra sound of legs before. [id number] Initial call taken by: Lenn Cal,  June 27, 2010 11:04 AM  Follow-up for Phone Call        advised patient i did not see one in her chart Follow-up by: Baldomero Lamy LPN,  October  3, 624THL 11:18 AM

## 2010-10-25 NOTE — Miscellaneous (Signed)
Summary: refill  Clinical Lists Changes  Medications: Rx of SYNTHROID 75 MCG  TABS (LEVOTHYROXINE SODIUM) one tab by mouth once daily;  #90 x 2;  Signed;  Entered by: Jimmey Ralph LPN;  Authorized by: [name] MD;  Method used: Handwritten    Prescriptions: SYNTHROID 75 MCG  TABS (LEVOTHYROXINE SODIUM) one tab by mouth once daily  #90 x 2   Entered by:   Jimmey Ralph LPN   Authorized by:   [name] MD   Signed by:   Jimmey Ralph LPN on 579FGE   Method used:   Handwritten   [id number]

## 2010-10-25 NOTE — Letter (Signed)
Summary: ADD ON LAB  ADD ON LAB   Imported By: Dierdre Harness 07/22/2010 09:02:03  _____________________________________________________________________  External Attachment:    Type:   Image     Comment:   External Document

## 2010-10-25 NOTE — Letter (Signed)
[facility]   Imported By: Dierdre Harness 06/09/2010 08:12:34  _____________________________________________________________________  External Attachment:    Type:   Image     Comment:   External Document

## 2010-10-25 NOTE — Progress Notes (Signed)
Summary: NEEDS NEW RX  Phone Note Call from Patient   Summary of Call: NEEDS RX FOR HER  POT.  COTCHLORID TAB 20 ME2 ER    135 PILLS WILL PICK UP RX TOMORROW Initial call taken by: Luann Bullins,  July 26, 2009 1:16 PM    Prescriptions: KLOR-CON M20 20 MEQ  TBCR (POTASSIUM CHLORIDE CRYS CR) one and half tabs by mouth once daily  #135 x 3   Entered by:   Kate Sable   Authorized by:   [name] MD   Signed by:   Kate Sable on 07/26/2009   Method used:   Electronically to        [facility] (retail)       [address]       [place], [place]  [address]       Ph: [phone]       Fax: [phone]   RxID:   [id number]   Appended Document: NEEDS NEW RX    Clinical Lists Changes  Medications: Rx of KLOR-CON M20 20 MEQ  TBCR (POTASSIUM CHLORIDE CRYS CR) one and half tabs by mouth once daily;  #135 x 3;  Signed;  Entered by: Kate Sable;  Authorized by: [name] MD;  Method used: Faxed to [facility], [address], [place]  [address], Ph: [phone], Fax: [phone]    Prescriptions: KLOR-CON M20 20 MEQ  TBCR (POTASSIUM CHLORIDE CRYS CR) one and half tabs by mouth once daily  #135 x 3   Entered by:   Kate Sable   Authorized by:   [name] MD   Signed by:   Kate Sable on 07/27/2009   Method used:   Faxed to ...       [facility] (mail-order)       [place]  [address]       Ph: [phone]       Fax: [phone]   RxID:   [id number]

## 2010-10-25 NOTE — Progress Notes (Signed)
Summary: DO NOT SEND RX OVER  Phone Note Call from Patient   Summary of Call: DO NOT [place] HERS CAME IN THE MAIL TODAY Initial call taken by: Dierdre Harness,  March 03, 2010 1:44 PM  Follow-up for Phone Call        noted Follow-up by: Kate Sable LPN,  June  9, 624THL [id number] PM

## 2010-10-25 NOTE — Progress Notes (Signed)
Summary: NEEDS A TUBE  Phone Note Call from Patient   Summary of Call: SAID SHE HAD BRON. BEFORE SHE CAME HERE AND THE [facility] SAID SHE HAD BRON.  AND HAS THE MACHINE AND WANTS TO WHY SHE IS NOT GETTING STUFF FOR HER MACHINE CALL BACK AT [id number]   THE TUBE IS WHAT SHE IS NEEDING CELL #  [id number] Initial call taken by: Dierdre Harness,  June 09, 2009 1:13 PM  Follow-up for Phone Call        advise I did not know she had a machine and bronchitis is not on her list of probs, i will add it since she states she has thedx and pls advise and erx duoneb 1 inhalation three times daily as needed 390 refill 3 dx. chronic bronchitis Follow-up by: [name] MD,  June 09, 2009 5:11 PM  New Problems: BRONCHITIS, CHRONIC (ICD-491.9)   New Problems: BRONCHITIS, CHRONIC (ICD-491.9) New/Updated Medications: DUONEB 0.5-2.5 (3) MG/3ML SOLN (IPRATROPIUM-ALBUTEROL) Take 1 tablet by mouth three times a day Prescriptions: DUONEB 0.5-2.5 (3) MG/3ML SOLN (IPRATROPIUM-ALBUTEROL) Take 1 tablet by mouth three times a day  #90 x 3   Entered by:   Kate Sable   Authorized by:   [name] MD   Signed by:   Kate Sable on 06/10/2009   Method used:   Electronically to        [facility] (retail)       [place] [address] [place], [place]  [address]       Ph: [phone]       Fax: [phone]   [id number]   Appended Document: NEEDS A TUBE pt aware

## 2010-10-25 NOTE — Letter (Signed)
Summary: Discharge Summary  Discharge Summary   Imported By: Dierdre [facility]ness 12/10/2009 08:11:41  _____________________________________________________________________  External Attachment:    Type:   Image     Comment:   External Document

## 2010-10-25 NOTE — Assessment & Plan Note (Signed)
Summary: office visit   Vital Signs:  Patient profile:   77 year old female Menstrual status:  postmenopausal Height:      66.5 inches Weight:      249.44 pounds BMI:     39.80 O2 Sat:      97 % Pulse rate:   87 / minute Pulse rhythm:   regular Resp:     16 per minute BP sitting:   120 / 76  (left arm) Cuff size:   large  Vitals Entered By: Kate Sable (May 19, 2009 1:10 PM)  Nutrition Counseling: Patient's BMI is greater than 25 and therefore counseled on weight management options. CC: Follow up chronic problems Is Patient Diabetic? Yes  Pain Assessment Patient in pain? no        CC:  Follow up chronic problems.  History of Present Illness: Patient reports doing well.  Denies any recent fever or chills.  Denies any appetite change or change in bowel movements. Patient denies depression, anxiety or insomnia. The pt when last seen had a flare of arthritic back pain, her options are limited because of difficult anticoagullation, she saw ortho who prescribed hydrocodne, her pain is better. She dnies polyuria, polydypsia or blurred vision, and tests on avg 3 times weekly.  Current Medications (verified): 1)  Bidil 20-37.5 Mg  Tabs (Isosorb Dinitrate-Hydralazine) .... One Tab By Mouth Three Times A Day 2)  Coumadin 5 Mg Tabs (Warfarin Sodium) .... Take 1 Tablet By Mouth Three Times A Day 3)  Synthroid 75 Mcg  Tabs (Levothyroxine Sodium) .... One Tab By Mouth Once Daily 4)  Lasix 40 Mg  Tabs (Furosemide) .... One Tab By Mouth Once Daily 5)  Omeprazole 40 Mg Cpdr (Omeprazole) .... Take 1 Tablet By Mouth Once A Day 6)  Calcium 600 1500 Mg  Tabs (Calcium Carbonate) .... One Tab By Mouth Once Daily 7)  Toprol Xl 100 Mg  Tb24 (Metoprolol Succinate) .... One Tab By Mouth Once Daily 8)  Hydrocodone-Acetaminophen 5-325 Mg  Tabs (Hydrocodone-Acetaminophen) .... One Tab By Mouth Once Daily 9)  Klor-Con M20 20 Meq  Tbcr (Potassium Chloride Crys Cr) .... One and Half Tabs By Mouth  Once Daily 10)  Glipizide 5 Mg  Tabs (Glipizide) .... One Tab By Mouth Once Daily 11)  Vytorin 10-20 Mg  Tabs (Ezetimibe-Simvastatin) .... One Tab By Mouth At Bedtime 12)  Tussionex Pennkinetic Er 8-10 Mg/31ml Lqcr (Chlorpheniramine-Hydrocodone) .... Take 5cc Two Times A Day As Needed 13)  Promethazine Hcl 25 Mg Tabs (Promethazine Hcl) .... One Tab By Mouth Two Times A Day Prn 14)  Advair Diskus 100-50 Mcg/dose Misc (Fluticasone-Salmeterol) .... Inhale One Puff By Mouth Two Times A Day 15)  Lanoxin 0.25 Mg Tabs (Digoxin) .... One Tab By Mouth Qd 16)  Folic Acid 1 Mg Tabs (Folic Acid) .... Take 1 Tablet By Mouth Once A Day 17)  Restoril 15 Mg Caps (Temazepam) .... Take 1 Tab By Mouth At Bedtime 18)  Amrix 15 Mg Xr24h-Cap (Cyclobenzaprine Hcl) .... Take 1 Tablet By Mouth Once A Day 19)  Coumadin 10 Mg Tabs (Warfarin Sodium) .... Take 1 Tablet By Mouth Once A Day Except On Thursday 13 Mg 20)  Cyclobenzaprine Hcl 10 Mg Tabs (Cyclobenzaprine Hcl) .... Take 1 Tab By Mouth At Bedtime 21)  Diflucan 150 Mg Tabs (Fluconazole) .... Take 1 Tablet By Mouth Once A Day 22)  Ciprofloxacin Hcl 500 Mg Tabs (Ciprofloxacin Hcl) .... Take 1 Tablet By Mouth Two Times A Day  Allergies (  verified): 1)  ! Pcn 2)  ! * Niaspan  Review of Systems      See HPI General:  See HPI. ENT:  Denies hoarseness, nasal congestion, sinus pressure, and sore throat. CV:  Complains of shortness of breath with exertion; denies chest pain or discomfort, difficulty breathing while lying down, and swelling of feet. Resp:  Denies cough, sputum productive, and wheezing. GI:  Denies abdominal pain, constipation, diarrhea, nausea, and vomiting. GU:  Denies dysuria, incontinence, and urinary frequency. MS:  See HPI. Derm:  Denies itching and rash. Psych:  Complains of anxiety; denies depression. Endo:  Denies excessive hunger and excessive thirst; testing once every other day blood sugars are averaging about 115. Heme:  Denies abnormal  bruising and bleeding. Allergy:  Denies hives or rash and itching eyes.  Physical Exam  General:  alert, well-hydrated, and overweight-appearing. HEENT: No facial asymmetry,  EOMI, No sinus tenderness, TM's Clear, oropharynx  pink and moist.   Chest: Clear to auscultation bilaterally.  CVS: S1, S2, systolic murmurs, No S3.   Abd: Soft, Nontender.  [id number] ROM spine, hips, shoulders and knees.  Ext: No edema.   CNS: CN 2-12 intact, power tone and sensation normal throughout.   Skin: Intact, no visible lesions or rashes.  Psych: Good eye contact, normal affect.  Memory intact, not anxious or depressed appearing.     Diabetes Management Exam:    Foot Exam (with socks and/or shoes not present):       Sensory-Monofilament:          Left foot: diminished          Right foot: diminished       Inspection:          Left foot: normal          Right foot: normal       Nails:          Left foot: normal          Right foot: normal   Impression & Recommendations:  Problem # 1:  HIP PAIN, RIGHT (ICD-719.45) Assessment Improved  Her updated medication list for this problem includes:    Hydrocodone-acetaminophen 5-325 Mg Tabs (Hydrocodone-acetaminophen) ..... One tab by mouth once daily    Amrix 15 Mg Xr24h-cap (Cyclobenzaprine hcl) .Marland Kitchen... Take 1 tablet by mouth once a day    Cyclobenzaprine Hcl 10 Mg Tabs (Cyclobenzaprine hcl) .Marland Kitchen... Take 1 tab by mouth at bedtime  Problem # 2:  DIABETES MELLITUS, TYPE II (ICD-250.00) Assessment: Unchanged  Her updated medication list for this problem includes:    Glipizide 5 Mg Tabs (Glipizide) ..... One tab by mouth once daily  Orders: Glucose, (CBG) (82962) Hemoglobin A1C (83036)  Labs Reviewed: Creat: 1.61 (02/11/2009)    Reviewed HgBA1c results: 6.6 (05/19/2009)  6.5 (02/11/2009)  Problem # 3:  GERD (ICD-530.81) Assessment: Unchanged  Her updated medication list for this problem includes:    Omeprazole 40 Mg Cpdr (Omeprazole) .Marland Kitchen...  Take 1 tablet by mouth once a day  Problem # 4:  OBESITY (ICD-278.00) Assessment: Unchanged  Ht: 66.5 (05/19/2009)   Wt: 249.44 (05/19/2009)   BMI: 39.80 (05/19/2009)  Problem # 5:  HYPERTENSION (ICD-401.9) Assessment: Unchanged  Her updated medication list for this problem includes:    Lasix 40 Mg Tabs (Furosemide) ..... One tab by mouth once daily    Toprol Xl 100 Mg Tb24 (Metoprolol succinate) ..... One tab by mouth once daily  Orders: T-Basic Metabolic Panel (99991111)  BP today: 120/76 Prior  BP: 120/70 (04/23/2009)  Labs Reviewed: K+: 4.8 (02/11/2009) Creat: : 1.61 (02/11/2009)   Chol: 98 (02/11/2009)   HDL: 29 (02/11/2009)   LDL: 42 (02/11/2009)   TG: 136 (02/11/2009)  Problem # 6:  HYPOTHYROIDISM (ICD-244.9) Assessment: Comment Only  Her updated medication list for this problem includes:    Synthroid 75 Mcg Tabs (Levothyroxine sodium) ..... One tab by mouth once daily  Orders: T-TSH [phone])  Labs Reviewed: TSH: 2.403 (02/11/2009)    HgBA1c: 6.6 (05/19/2009) Chol: 98 (02/11/2009)   HDL: 29 (02/11/2009)   LDL: 42 (02/11/2009)   TG: 136 (02/11/2009)  Complete Medication List: 1)  Bidil 20-37.5 Mg Tabs (Isosorb dinitrate-hydralazine) .... One tab by mouth three times a day 2)  Coumadin 5 Mg Tabs (Warfarin sodium) .... Take 1 tablet by mouth three times a day 3)  Synthroid 75 Mcg Tabs (Levothyroxine sodium) .... One tab by mouth once daily 4)  Lasix 40 Mg Tabs (Furosemide) .... One tab by mouth once daily 5)  Omeprazole 40 Mg Cpdr (Omeprazole) .... Take 1 tablet by mouth once a day 6)  Calcium 600 1500 Mg Tabs (Calcium carbonate) .... One tab by mouth once daily 7)  Toprol Xl 100 Mg Tb24 (Metoprolol succinate) .... One tab by mouth once daily 8)  Hydrocodone-acetaminophen 5-325 Mg Tabs (Hydrocodone-acetaminophen) .... One tab by mouth once daily 9)  Klor-con M20 20 Meq Tbcr (Potassium chloride crys cr) .... One and half tabs by mouth once daily 10)   Glipizide 5 Mg Tabs (Glipizide) .... One tab by mouth once daily 11)  Vytorin 10-20 Mg Tabs (Ezetimibe-simvastatin) .... One tab by mouth at bedtime 12)  Tussionex Pennkinetic Er 8-10 Mg/2ml Lqcr (Chlorpheniramine-hydrocodone) .... Take 5cc two times a day as needed 13)  Promethazine Hcl 25 Mg Tabs (Promethazine hcl) .... One tab by mouth two times a day prn 14)  Advair Diskus 100-50 Mcg/dose Misc (Fluticasone-salmeterol) .... Inhale one puff by mouth two times a day 15)  Lanoxin 0.25 Mg Tabs (Digoxin) .... One tab by mouth qd 16)  Folic Acid 1 Mg Tabs (Folic acid) .... Take 1 tablet by mouth once a day 17)  Restoril 15 Mg Caps (Temazepam) .... Take 1 tab by mouth at bedtime 18)  Amrix 15 Mg Xr24h-cap (Cyclobenzaprine hcl) .... Take 1 tablet by mouth once a day 19)  Coumadin 10 Mg Tabs (Warfarin sodium) .... Take 1 tablet by mouth once a day except on thursday 13 mg 20)  Cyclobenzaprine Hcl 10 Mg Tabs (Cyclobenzaprine hcl) .... Take 1 tab by mouth at bedtime 21)  Diflucan 150 Mg Tabs (Fluconazole) .... Take 1 tablet by mouth once a day 22)  Ciprofloxacin Hcl 500 Mg Tabs (Ciprofloxacin hcl) .... Take 1 tablet by mouth two times a day  Other Orders: T-Hepatic Function [id number]) T-Lipid Profile [id number])  Patient Instructions: 1)  Please schedule a follow-up appointment in 4 months. 2)  It is important that you exercise regularly at least 20 minutes 5 times a week. If you develop chest pain, have severe difficulty breathing, or feel very tired , stop exercising immediately and seek medical attention. 3)  You need to lose weight. Consider a lower calorie diet and regular exercise.  4)  BMP prior to visit, ICD-9: 5)  Hepatic Panel prior to visit, ICD-9:  fasting  labs end September 6)  Lipid Panel prior to visit, ICD-9: 7)  TSH prior to visit, ICD-9: Prescriptions: VYTORIN 10-20 MG  TABS (EZETIMIBE-SIMVASTATIN) one tab by mouth at bedtime  #56  x 0   Entered and Authorized by:    [name] MD   Signed by:   [name] MD on 06/06/2009   Method used:   Samples Given   RxID:   [id number]     Laboratory Results   Blood Tests   Date/Time Received: May 19, 2009  Date/Time Reported: May 19, 2009   Glucose (random): 128 mg/dL   (Normal Range: 70-105) HGBA1C: 6.6%   (Normal Range: Non-Diabetic - 3-6%   Control Diabetic - 6-8%)

## 2010-10-25 NOTE — Miscellaneous (Signed)
Summary: refill  Clinical Lists Changes  Medications: Rx of KLOR-CON M20 20 MEQ  TBCR (POTASSIUM CHLORIDE CRYS CR) one and half tabs by mouth once daily;  #135 x 3;  Signed;  Entered by: Kate Sable;  Authorized by: [name] MD;  Method used: Printed then faxed to [facility] [address], [place], [place], [place]  [address], Ph: [phone], Fax: [phone]    Prescriptions: KLOR-CON M20 20 MEQ  TBCR (POTASSIUM CHLORIDE CRYS CR) one and half tabs by mouth once daily  #135 x 3   Entered by:   Kate Sable   Authorized by:   [name] MD   Signed by:   Kate Sable on 07/27/2009   Method used:   Printed then faxed to ...       [facility] (retail)       [address]       [place], [place]  [address]       Ph: [phone]       Fax: [phone]   RxID:   [id number]

## 2010-10-25 NOTE — Progress Notes (Signed)
Summary: LAB RESULTS  Phone Note Call from Patient   Summary of Call: WANTS HER LAB RESULTS Initial call taken by: Dierdre [facility],  July 22, 2010 10:22 AM  Follow-up for Phone Call        advise labs are good , blood sugar well controlled Follow-up by: [name] MD,  July 22, 2010 12:17 PM  Additional Follow-up for Phone Call Additional follow up Details #1::        patient aware Additional Follow-up by: Baldomero Lamy LPN,  October 28, 624THL 2:12 PM

## 2010-10-25 NOTE — Progress Notes (Signed)
Summary: hip hurts wants shot  Phone Note Call from Patient   Summary of Call: hip hurting wants a shot Initial call taken by: Dierdre Harness,  August 15, 2010 8:48 AM  Follow-up for Phone Call        advise she needs to call dr Luna Glasgow about that the shots we give here cannot be given with her coumadin andthey arenot in the joint, sorry we cannot give her shots here, pls let her know Follow-up by: [name] MD,  August 15, 2010 12:20 PM  Additional Follow-up for Phone Call Additional follow up Details #1::        PATIENT AWARE Additional Follow-up by: Dierdre Harness,  August 15, 2010 1:01 PM

## 2010-10-25 NOTE — Progress Notes (Signed)
Summary: call back  Phone Note Call from Patient   Summary of Call: needs for you to call her about a medicine Initial call taken by: Dierdre Harness,  February 23, 2010 11:05 AM    Prescriptions: SYNTHROID 75 MCG  TABS (LEVOTHYROXINE SODIUM) one tab by mouth once daily  #90 x 3   Entered by:   Kate Sable LPN   Authorized by:   [name] MD   Signed by:   Kate Sable LPN on 579FGE   Method used:   Faxed to ...       [facility] (retail)             , [place]         Ph: [phone]       Fax: [phone]   RxID:   [id number]

## 2010-10-25 NOTE — Progress Notes (Signed)
Summary: COUGH MEDICINE  Phone Note Call from Patient   Summary of Call: NEEDS SOME [place]. IS BREAKING UP BUT NOT NOT TO FAST  [facility] Initial call taken by: Dierdre Harness,  September 08, 2008 1:32 PM  Follow-up for Phone Call        pls verify the name of the med she is asking, if I prescribed it before pls erx 1 refill Follow-up by: [name] MD,  September 08, 2008 4:48 PM  Additional Follow-up for Phone Call Additional follow up Details #1::        Phone Call Completed, Rx Called In Additional Follow-up by: Jimmey Ralph LPN,  December 15, 579FGE 5:05 PM      Prescriptions: Cathie Hoops ER 8-10 MG/5ML LQCR (CHLORPHENIRAMINE-HYDROCODONE) Take 5cc two times a day as needed  #200 x 0   Entered by:   Jimmey Ralph LPN   Authorized by:   [name] MD   Signed by:   Jimmey Ralph LPN on 579FGE   Method used:   Printed then faxed to ...       [facility] (retail)       [address]       [place], [place]  [address]       Ph: [phone]       Fax: [phone]   RxID:   [id number] Cathie Hoops ER 8-10 MG/5ML LQCR (CHLORPHENIRAMINE-HYDROCODONE) Take 5cc two times a day as needed  #200 x 0   Entered by:   Jimmey Ralph LPN   Authorized by:   [name] MD   Signed by:   Jimmey Ralph LPN on 579FGE   Method used:   Printed then faxed to ...       [facility] (retail)       [address]       [place], [place]  [address]       Ph: [phone]       Fax: [phone]   RxID:   [id number]

## 2010-10-25 NOTE — Progress Notes (Signed)
Summary: echocardiography  echocardiography   Imported By: Dierdre Harness 01/13/2010 16:53:19  _____________________________________________________________________  External Attachment:    Type:   Image     Comment:   External Document

## 2010-10-25 NOTE — Letter (Signed)
Summary: DISCHARGE INSTRUCTIONS  DISCHARGE INSTRUCTIONS   Imported By: Dierdre [facility]ness 12/10/2009 08:12:22  _____________________________________________________________________  External Attachment:    Type:   Image     Comment:   External Document

## 2010-10-25 NOTE — Letter (Signed)
Summary: OVERNIGHT  PULSE  OVERNIGHT  PULSE   Imported By: Dierdre [facility]ness 07/20/2010 13:23:05  _____________________________________________________________________  External Attachment:    Type:   Image     Comment:   External Document

## 2010-10-25 NOTE — Progress Notes (Signed)
Summary: order for O2, Adam called  Phone Note Other Incoming   Summary of Call: Quita Skye called and states you sent the paperwork back in this morning about the overnight oximetry.  He states they will need an order for that, if you get me an order I will be glad to fax over.  Thanks Initial call taken by: Eliezer Mccoy,  July 25, 2010 4:01 PM  Follow-up for Phone Call        order written pls fax see in your box Follow-up by: [name] MD,  July 25, 2010 6:23 PM  Additional Follow-up for Phone Call Additional follow up Details #1::        Faxed order over to [facility]. Additional Follow-up by: Eliezer Mccoy,  July 25, 2010 6:25 PM

## 2010-10-25 NOTE — Progress Notes (Signed)
Summary: Office Visit  Office Visit   Imported By: Dierdre [facility]ness 12/29/2009 11:44:04  _____________________________________________________________________  External Attachment:    Type:   Image     Comment:   External Document

## 2010-10-25 NOTE — Progress Notes (Signed)
Summary: DIABETIC TESTING SUPPLIES  Phone Note Call from Patient   Summary of Call: [facility] A FAX FOR DIABETIC TESTING SUPPLIES NEEDS TO KNOW THE STATUS CALL BACK [phone] EXT. [phone]  Initial call taken by: Dierdre Harness,  January 12, 2009 2:38 PM  Follow-up for Phone Call        Just faxed the form that they needed. Follow-up by: Kate Sable,  January 12, 2009 2:51 PM

## 2010-10-25 NOTE — Progress Notes (Signed)
[facility]   Imported By: Dierdre [facility] 03/24/2009 08:44:41  _____________________________________________________________________  External Attachment:    Type:   Image     Comment:   External Document

## 2010-10-25 NOTE — Progress Notes (Signed)
Summary: medicine  Phone Note Call from Patient   Summary of Call: WANTS TO KNOW CAN YOU SEND IN HER SOME HYDROCODONE LIKE YOU HAVE BEFORE TO [facility] please  [facility] PLEASE CALL BACK AT [phone] Initial call taken by: Dierdre Harness,  August 15, 2010 1:43 PM  Follow-up for Phone Call        list has oxycodone pls check pharmacy see if she has any of this pain med, let me know asap Follow-up by: [name] MD,  August 15, 2010 3:31 PM  Additional Follow-up for Phone Call Additional follow up Details #1::        oxycodone filled 05/03/10 no other pain meds Additional Follow-up by: Baldomero Lamy LPN,  November 22, 624THL 2:43 PM    Additional Follow-up for Phone Call Additional follow up Details #2::    pls advise script for [pain med written she will have to collect it it cannot be called in Follow-up by: [name] MD,  August 16, 2010 5:08 PM  Additional Follow-up for Phone Call Additional follow up Details #3:: Details for Additional Follow-up Action Taken: LEFT MESSAGE WITH [name] Additional Follow-up by: Dierdre Harness,  August 17, 2010 8:27 AM

## 2010-10-25 NOTE — Progress Notes (Signed)
Summary: [facility] MEDICINE  Phone Note Call from Patient   Summary of Call: NEEDS HER STOMACH MEDICINE SENT TO [facility] ASAP SHE WAS SICK ALL DAY YESTERDAY WITHOUT IT  90 DAY SUPPLY Initial call taken by: Dierdre Harness,  December 27, 2009 10:39 AM    Prescriptions: PROMETHAZINE HCL 25 MG TABS (PROMETHAZINE HCL) one tab by mouth two times a day prn  #40 x 3   Entered by:   Baldomero Lamy LPN   Authorized by:   [name] MD   Signed by:   Baldomero Lamy LPN on [id number]   Method used:   Electronically to        [facility] (retail)       [address]       [place], [place]  [address]       Ph: [phone]       Fax: [phone]   RxID:   [id number]

## 2010-10-25 NOTE — Letter (Signed)
[facility]   Imported By: Dierdre Harness 06/29/2010 11:08:27  _____________________________________________________________________  External Attachment:    Type:   Image     Comment:   External Document

## 2010-10-25 NOTE — Progress Notes (Signed)
  Phone Note Other Incoming   Caller: dr simpson Summary of Call: pls contac Ms Kien, let her know she is mildly dehydrated, she needs to ensure she drinks  7 glasses of water daily (56 ounces) , needs a rept chem 7 next Thursday morning (not stat) Initial call taken by: [name] MD,  June 03, 2010 8:14 AM  Follow-up for Phone Call        called patient, left message Follow-up by: Baldomero Lamy LPN,  September  9, 624THL 10:17 AM  Additional Follow-up for Phone Call Additional follow up Details #1::        called patient, left message Additional Follow-up by: Kate Sable LPN,  September  9, 624THL 3:59 PM    Additional Follow-up for Phone Call Additional follow up Details #2::    Patient aware Follow-up by: Kate Sable LPN,  September  9, 624THL 4:44 PM

## 2010-10-25 NOTE — Progress Notes (Signed)
Summary: icd follow up   icd follow up   Imported By: Dierdre Harness 12/08/2009 10:43:56  _____________________________________________________________________  External Attachment:    Type:   Image     Comment:   External Document

## 2010-10-25 NOTE — Assessment & Plan Note (Signed)
Summary: ov   Vital Signs:  Patient profile:   77 year old female Menstrual status:  postmenopausal Height:      66.5 inches Weight:      252 pounds BMI:     40.21 O2 Sat:      95 % Pulse rate:   87 / minute Resp:     16 per minute BP sitting:   126 / 70  (left arm) Cuff size:   large  Vitals Entered By: Kate Sable (June 29, 2009 1:09 PM) CC: Follow up chronic problems Is Patient Diabetic? Yes   CC:  Follow up chronic problems.  History of Present Illness: Reports  that she is doing fairly well. Denies recent fever or chills. Denies sinus pressure, nasal congestion , ear pain or sore throat. Denies chest congestion, or cough productive of sputum. Denies chest pain, palpitations, PND, orthopnea or leg swelling. Denies abdominal pain, nausea, vomitting, diarrhea or constipation. Denies change in bowel movements or bloody stool. Denies dysuria , frequency, incontinence or hesitancy. Denies  joint pain, swelling, or reduced mobility. Denies headaches, vertigo, seizures. Denies depression, anxiety or insomnia. Denies  rash, lesions, or itch.     Current Medications (verified): 1)  Bidil 20-37.5 Mg  Tabs (Isosorb Dinitrate-Hydralazine) .... One Tab By Mouth Three Times A Day 2)  Coumadin 5 Mg Tabs (Warfarin Sodium) .... Take 1 Tablet By Mouth Three Times A Day 3)  Synthroid 75 Mcg  Tabs (Levothyroxine Sodium) .... One Tab By Mouth Once Daily 4)  Lasix 40 Mg  Tabs (Furosemide) .... One Tab By Mouth Once Daily 5)  Omeprazole 40 Mg Cpdr (Omeprazole) .... Take 1 Tablet By Mouth Once A Day 6)  Calcium 600 1500 Mg  Tabs (Calcium Carbonate) .... One Tab By Mouth Once Daily 7)  Toprol Xl 100 Mg  Tb24 (Metoprolol Succinate) .... One Tab By Mouth Once Daily 8)  Hydrocodone-Acetaminophen 5-325 Mg  Tabs (Hydrocodone-Acetaminophen) .... One Tab By Mouth Once Daily 9)  Klor-Con M20 20 Meq  Tbcr (Potassium Chloride Crys Cr) .... One and Half Tabs By Mouth Once Daily 10)  Glipizide 5  Mg  Tabs (Glipizide) .... One Tab By Mouth Once Daily 11)  Vytorin 10-20 Mg  Tabs (Ezetimibe-Simvastatin) .... One Tab By Mouth At Bedtime 12)  Tussionex Pennkinetic Er 8-10 Mg/1ml Lqcr (Chlorpheniramine-Hydrocodone) .... Take 5cc Two Times A Day As Needed 13)  Promethazine Hcl 25 Mg Tabs (Promethazine Hcl) .... One Tab By Mouth Two Times A Day Prn 14)  Advair Diskus 100-50 Mcg/dose Misc (Fluticasone-Salmeterol) .... Inhale One Puff By Mouth Two Times A Day 15)  Lanoxin 0.25 Mg Tabs (Digoxin) .... One Tab By Mouth Qd 16)  Folic Acid 1 Mg Tabs (Folic Acid) .... Take 1 Tablet By Mouth Once A Day 17)  Restoril 15 Mg Caps (Temazepam) .... Take 1 Tab By Mouth At Bedtime 18)  Amrix 15 Mg Xr24h-Cap (Cyclobenzaprine Hcl) .... Take 1 Tablet By Mouth Once A Day 19)  Coumadin 10 Mg Tabs (Warfarin Sodium) .... Take 1 Tablet By Mouth Once A Day Except On Thursday 13 Mg 20)  Cyclobenzaprine Hcl 10 Mg Tabs (Cyclobenzaprine Hcl) .... Take 1 Tab By Mouth At Bedtime 21)  Diflucan 150 Mg Tabs (Fluconazole) .... Take 1 Tablet By Mouth Once A Day 22)  Ciprofloxacin Hcl 500 Mg Tabs (Ciprofloxacin Hcl) .... Take 1 Tablet By Mouth Two Times A Day 23)  Duoneb 0.5-2.5 (3) Mg/58ml Soln (Ipratropium-Albuterol) .... Take 1 Tablet By Mouth Three Times  A Day 24)  Losartan Potassium-Hctz 50-12.5 Mg Tabs (Losartan Potassium-Hctz) .... Take 1 Tablet By Mouth Once A Day  Allergies (verified): 1)  ! Pcn 2)  ! * Niaspan  Review of Systems      See HPI MS:  Complains of joint pain, low back pain, mid back pain, muscle weakness, and stiffness; symptoms of back pain radiating down the right leg have worsened. Endo:  Denies cold intolerance, excessive hunger, excessive thirst, excessive urination, heat intolerance, polyuria, and weight change.  Physical Exam  General:  alert, well-hydrated, and overweight-appearing. HEENT: No facial asymmetry,  EOMI, No sinus tenderness, TM's Clear, oropharynx  pink and moist.outer ear canl  erythematous   Chest: Clear to auscultation bilaterally.  CVS: S1, S2, systolic murmurs, No S3. positveclick.  Abd: Soft, Nontender.  [id number] ROM spine, hips, shoulders and knees.  Ext: No edema.   CNS: CN 2-12 intact, power tone and sensation normal throughout.   Skin: Intact, no visible lesions or rashes.  Psych: Good eye contact, normal affect.  Memory intact, not anxious or depressed appearing.      Impression & Recommendations:  Problem # 1:  DIABETES MELLITUS, TYPE II (ICD-250.00) Assessment Unchanged  Her updated medication list for this problem includes:    Glipizide 5 Mg Tabs (Glipizide) ..... One tab by mouth once daily    Losartan Potassium-hctz 50-12.5 Mg Tabs (Losartan potassium-hctz) .Marland Kitchen... Take 1 tablet by mouth once a day  Orders: Glucose, (CBG) [phone])  Labs Reviewed: Creat: 1.69 (06/23/2009)    Reviewed HgBA1c results: 6.6 (05/19/2009)  6.5 (02/11/2009)  Problem # 2:  HYPERLIPIDEMIA (ICD-272.4) Assessment: Comment Only  Her updated medication list for this problem includes:    Vytorin 10-20 Mg Tabs (Ezetimibe-simvastatin) ..... One tab by mouth at bedtime  Labs Reviewed: SGOT: 27 (06/23/2009)   SGPT: 12 (06/23/2009)   HDL:35 (06/23/2009), 29 (02/11/2009)  LDL:55 (06/23/2009), 42 (02/11/2009)  Chol:117 (06/23/2009), 98 (02/11/2009)  Trig:135 (06/23/2009), 136 (02/11/2009)  Problem # 3:  HYPERTENSION (ICD-401.9) Assessment: Unchanged  Her updated medication list for this problem includes:    Lasix 40 Mg Tabs (Furosemide) ..... One tab by mouth once daily    Toprol Xl 100 Mg Tb24 (Metoprolol succinate) ..... One tab by mouth once daily    Losartan Potassium-hctz 50-12.5 Mg Tabs (Losartan potassium-hctz) .Marland Kitchen... Take 1 tablet by mouth once a day  BP today: 126/70 Prior BP: 120/76 (05/19/2009)  Labs Reviewed: K+: 4.6 (06/23/2009) Creat: : 1.69 (06/23/2009)   Chol: 117 (06/23/2009)   HDL: 35 (06/23/2009)   LDL: 55 (06/23/2009)   TG: 135  (06/23/2009)  Problem # 4:  OBESITY (ICD-278.00) Assessment: Deteriorated  Ht: 66.5 (06/29/2009)   Wt: 252 (06/29/2009)   BMI: 40.21 (06/29/2009)  Problem # 5:  DEGENERATIVE JOINT DISEASE, SPINE (ICD-721.90) Assessment: Deteriorated pt to start gabapentin  Problem # 6:  OTITIS EXTERNA (ICD-380.10) Assessment: Comment Only flucinolone otic prescribed  Complete Medication List: 1)  Bidil 20-37.5 Mg Tabs (Isosorb dinitrate-hydralazine) .... One tab by mouth three times a day 2)  Coumadin 5 Mg Tabs (Warfarin sodium) .... Take 1 tablet by mouth three times a day 3)  Synthroid 75 Mcg Tabs (Levothyroxine sodium) .... One tab by mouth once daily 4)  Lasix 40 Mg Tabs (Furosemide) .... One tab by mouth once daily 5)  Omeprazole 40 Mg Cpdr (Omeprazole) .... Take 1 tablet by mouth once a day 6)  Calcium 600 1500 Mg Tabs (Calcium carbonate) .... One tab by mouth once daily 7)  Toprol  Xl 100 Mg Tb24 (Metoprolol succinate) .... One tab by mouth once daily 8)  Hydrocodone-acetaminophen 5-325 Mg Tabs (Hydrocodone-acetaminophen) .... One tab by mouth once daily 9)  Klor-con M20 20 Meq Tbcr (Potassium chloride crys cr) .... One and half tabs by mouth once daily 10)  Glipizide 5 Mg Tabs (Glipizide) .... One tab by mouth once daily 11)  Vytorin 10-20 Mg Tabs (Ezetimibe-simvastatin) .... One tab by mouth at bedtime 12)  Tussionex Pennkinetic Er 8-10 Mg/38ml Lqcr (Chlorpheniramine-hydrocodone) .... Take 5cc two times a day as needed 13)  Promethazine Hcl 25 Mg Tabs (Promethazine hcl) .... One tab by mouth two times a day prn 14)  Advair Diskus 100-50 Mcg/dose Misc (Fluticasone-salmeterol) .... Inhale one puff by mouth two times a day 15)  Lanoxin 0.25 Mg Tabs (Digoxin) .... One tab by mouth qd 16)  Folic Acid 1 Mg Tabs (Folic acid) .... Take 1 tablet by mouth once a day 17)  Restoril 15 Mg Caps (Temazepam) .... Take 1 tab by mouth at bedtime 18)  Amrix 15 Mg Xr24h-cap (Cyclobenzaprine hcl) .... Take 1  tablet by mouth once a day 19)  Coumadin 10 Mg Tabs (Warfarin sodium) .... Take 1 tablet by mouth once a day except on thursday 13 mg 20)  Cyclobenzaprine Hcl 10 Mg Tabs (Cyclobenzaprine hcl) .... Take 1 tab by mouth at bedtime 21)  Diflucan 150 Mg Tabs (Fluconazole) .... Take 1 tablet by mouth once a day 22)  Ciprofloxacin Hcl 500 Mg Tabs (Ciprofloxacin hcl) .... Take 1 tablet by mouth two times a day 23)  Duoneb 0.5-2.5 (3) Mg/95ml Soln (Ipratropium-albuterol) .... Take 1 tablet by mouth three times a day 24)  Losartan Potassium-hctz 50-12.5 Mg Tabs (Losartan potassium-hctz) .... Take 1 tablet by mouth once a day 25)  Gabapentin 100 Mg Caps (Gabapentin) .... One to three capsules at  night for leg pain and burning 26)  Flucinolone Otic  .... 2 drops to affected ear two times a day x 1 week  Patient Instructions: 1)  Keep appt as before. 2)  pls try and cut back the amt you eat , you have gained 7 pounds since the Summer, you really need to lose the weight. 3)  I have sent in meds for the stinging in your leg and also ear drops. Pls stop scratching your ear. Prescriptions: LASIX 40 MG  TABS (FUROSEMIDE) one tab by mouth once daily  #90 x 1   Entered by:   Jimmey Ralph LPN   Authorized by:   [name] MD   Signed by:   Jimmey Ralph LPN on 579FGE   Method used:   Faxed to ...       [facility] (mail-order)       [address] [place], [place]  [address]       Ph: [phone]       Fax: [phone]   [id number] LANOXIN 0.25 MG TABS (DIGOXIN) one tab by mouth qd  #90 x 1   Entered by:   Jimmey Ralph LPN   Authorized by:   [name] MD   Signed by:   Jimmey Ralph LPN on 579FGE   Method used:   Faxed to ...       [facility] (mail-order)       [place]  [address]       Ph: [phone]       Fax: [phone]  [id number] TOPROL XL 100 MG  TB24 (METOPROLOL SUCCINATE) one tab by mouth once daily  #90 x 1   Entered by:   Jimmey Ralph  LPN   Authorized by:   [name] MD   Signed by:   Jimmey Ralph LPN on 579FGE   Method used:   Faxed to ...       [facility] (mail-order)       [address] [place], [place]  [address]       Ph: [phone]       Fax: [phone]   [id number] TOPROL XL 100 MG  TB24 (METOPROLOL SUCCINATE) one tab by mouth once daily  #90 x 4   Entered by:   Kate Sable   Authorized by:   [name] MD   Signed by:   Kate Sable on 06/29/2009   Method used:   Printed then faxed to ...       [facility]  [address] ([facility]ail)       [address]       [place], [place]  [address]       Ph: [phone]       Fax: [phone]   RxID:   [id number] LANOXIN 0.25 MG TABS (DIGOXIN) one tab by mouth qd  #90 x 6   Entered by:   Kate Sable   Authorized by:   [name] MD   Signed by:   Kate Sable on 06/29/2009   Method used:   Printed then faxed to ...       [facility] (retail)       [address]       [place], [place]  [address]       Ph: [phone]       Fax: [phone]   RxID:   [id number] GABAPENTIN 100 MG CAPS (GABAPENTIN) one to three capsules at  night for leg pain and burning  #90 x 4   Entered by:   Kate Sable   Authorized by:   [name] MD   Signed by:   Kate Sable on 06/29/2009   Method used:   Printed then faxed to ...       [facility]  [address] (retail)       [address]       [place], [place]  [address]       Ph: [phone]       Fax: [phone]   RxID:   [id number] LANOXIN 0.25 MG TABS (DIGOXIN) one tab by mouth qd  #90 x 6   Entered by:   Kate Sable   Authorized by:   [name] MD   Signed by:   Kate Sable on 06/29/2009   Method used:   Printed then faxed to ...       [facility]  [address] (retail)       [address]       [place], [place]  [address]       Ph: [phone]       Fax: [phone]   RxID:    [id number] TOPROL XL 100 MG  TB24 (METOPROLOL SUCCINATE) one tab by mouth once daily  #90 x 4   Entered by:   Kate Sable  Authorized by:   [name] MD   Signed by:   Kate Sable on 06/29/2009   Method used:   Printed then faxed to ...       [facility]  [address] (retail)       [address]       [place], [place]  [address]       Ph: [phone]       Fax: [phone]   RxID:   [id number] LASIX 40 MG  TABS (FUROSEMIDE) one tab by mouth once daily  #90 x 4   Entered by:   Kate Sable   Authorized by:   [name] MD   Signed by:   Kate Sable on 06/29/2009   Method used:   Printed then faxed to ...       [facility]  [address] (retail)       [address]       [place], [place]  [address]       Ph: [phone]       Fax: [phone]   RxID:   [id number] Shana [place] 2 drops to affected ear two times a day x 1 week  #20cc x 0   Entered by:   Kate Sable   Authorized by:   [name] MD   Signed by:   Kate Sable on 06/29/2009   Method used:   Printed then faxed to ...       [facility]       [address]       [place], [place]  [address]       Ph: [phone]       Fax: [phone]   RxID:   [id number] GABAPENTIN 100 MG CAPS (GABAPENTIN) one to three capsules at  night for leg pain and burning  #90 x 4   Entered and Authorized by:   [name] MD   Signed by:   [name] MD on 06/29/2009   Method used:   Electronically to        [facility] (retail)       [address]       [place], [place]  [address]       Ph: [phone]       Fax: [phone]   RxID:   [id number]   Laboratory Results   Blood Tests   Date/Time Received: June 29, 2009  Date/Time Reported: June 29, 2009   Glucose (random): 120 mg/dL   (Normal Range: 70-105)

## 2010-10-25 NOTE — Miscellaneous (Signed)
Summary: refill  Clinical Lists Changes  Medications: Removed medication of DIGOXIN 0.25 MG TABS (DIGOXIN) ONE TAB by mouth QD Removed medication of DIGOXIN 0.25 MG  TABS (DIGOXIN) one tab by mouth once daily Added new medication of LANOXIN 0.25 MG TABS (DIGOXIN) one tab by mouth qd - Signed Rx of LANOXIN 0.25 MG TABS (DIGOXIN) one tab by mouth qd;  #90 x 1;  Signed;  Entered by: Jimmey Ralph LPN;  Authorized by: [name] MD;  Method used: Faxed to [facility], [address], [place]  [address], Ph: [phone], Fax: [phone]    Prescriptions: LANOXIN 0.25 MG TABS (DIGOXIN) one tab by mouth qd  #90 x 1   Entered by:   Jimmey Ralph LPN   Authorized by:   [name] MD   Signed by:   Jimmey Ralph LPN on [id number]   Method used:   Faxed to ...       [facility] (mail-order)       [place]  [address]       Ph: [phone]       Fax: [phone]   RxID:   [id number]

## 2010-10-25 NOTE — Letter (Signed)
[facility]   Im[facility] By: Dierdre Harness 07/13/2010 12:55:43  _____________________________________________________________________  External Attachment:    Type:   Image     Comment:   External Document

## 2010-10-25 NOTE — Medication Information (Signed)
Summary: [facility]  [facility]   Imported By: Dierdre Harness 07/26/2010 09:31:24  _____________________________________________________________________  External Attachment:    Type:   Image     Comment:   External Document

## 2010-10-25 NOTE — Progress Notes (Signed)
Summary: meds  Phone Note Call from Patient   Summary of Call: pt needs to speak with Brandi about some meds. [id number] Initial call taken by: Lenn Cal,  April 06, 2010 3:11 PM  Follow-up for Phone Call        wants Bidil 20-37.5mg  [facility] and also the Furosemide. Her last rx was for one a day but she has been taking 1 1/2 tabs once daily and she is almost out.  Ok to send in?  Follow-up by: Kate Sable LPN,  July 14, 624THL 624THL AM  Additional Follow-up for Phone Call Additional follow up Details #1::        dose inc printed  also thenbidl dose changed to twice diaily, pls write dose change, syamp and fax, let pt know Additional Follow-up by: [name] MD,  April 07, 2010 4:39 PM    Additional Follow-up for Phone Call Additional follow up Details #2::    [facility]  Follow-up by: Kate Sable LPN,  July 15, 624THL 579FGE AM  New/Updated Medications: FUROSEMIDE 40 MG/4ML SOLN (FUROSEMIDE) one and a half  tablets once daily BIDIL 20-37.5 MG TABS (ISOSORB DINITRATE-HYDRALAZINE) Take 1 tablet by mouth two times a day Prescriptions: BIDIL 20-37.5 MG TABS (ISOSORB DINITRATE-HYDRALAZINE) Take 1 tablet by mouth two times a day  #180 x 3   Entered by:   Kate Sable LPN   Authorized by:   [name] MD   Signed by:   Kate Sable LPN on [id number]   Method used:   Printed then faxed to ...       [facility] (retail)       [address]       [place], [place]  [address]       Ph: [phone]       Fax: [phone]   RxID:   [id number] FUROSEMIDE 83 MG/4ML SOLN (FUROSEMIDE) one and a half  tablets once daily  #135 x 3   Entered by:   Kate Sable LPN   Authorized by:   [name] MD   Signed by:   Kate Sable LPN on [id number]   Method used:   Printed then faxed to ...       [facility]  [address] ([facility]ail)       [address]       [place], [place]  [address]  Ph: [phone]       Fax: [phone]   RxID:   [id number] BIDIL 20-37.5 MG TABS (ISOSORB DINITRATE-HYDRALAZINE) Take 1 tablet by mouth two times a day  #180 x 3   Entered by:   [name] MD   Authorized by:   Kate Sable LPN   Signed by:   [name] MD on 04/07/2010   Method used:   Printed then faxed to ...       [facility] (retail)       [address]       [place], [place]  [address]       Ph: [phone]       Fax: [phone]   RxID:   [id number] FUROSEMIDE 45 MG/4ML SOLN (FUROSEMIDE) one and a half  tablets once daily  #135 x 3   Entered by:   [name] MD   Authorized by:   Kate Sable  LPN   Signed by:   [name] MD on 04/07/2010   Method used:   Printed then faxed to ...       [facility] ([facility]       [address]       [place], [place]  [address]       Ph: [phone]       Fax: [phone]   RxID:   [id number]

## 2010-10-25 NOTE — Assessment & Plan Note (Signed)
Summary: OV   Vital Signs:  Patient profile:   77 year old female Menstrual status:  postmenopausal Height:      66.5 inches Weight:      250.75 pounds BMI:     40.01 O2 Sat:      96 % on Room air Pulse rate:   81 / minute Pulse rhythm:   regular Resp:     16 per minute BP sitting:   116 / 70  (left arm)  Vitals Entered By: Baldomero Lamy LPN (September 13, 624THL 1:27 PM)  Nutrition Counseling: Patient's BMI is greater than 25 and therefore counseled on weight management options.  O2 Flow:  Room air CC: body aches, nausea, fatigue Is Patient Diabetic? Yes Did you bring your meter with you today? No Comments did not bring meds to ov   Primary Care Provider:  [name] MD  CC:  body aches, nausea, and fatigue.  History of Present Illness: Pt reports that she does not feel well. She feels weak, is constantly nauseated, and recently has been attempting to increase her fluid intake in the hope of correcting dehydration with further deterrioration in her nymbers , unfortunately. She has a poor apetite and is experienvcing generaLISED FATIGUE. sHE DOES HAVE CHOLELITHIASIS, BUT HAS BEEN ADVISED AGAINST SURGERY ELECTIVELY BY HER CARDIOLOGIST BECAUSE OF RISK, SHE ALSO HAS AN INTRADUCT PAPILLOMA WHICH IS BEING FOLLOWED CLOSELY. She is understandably anxious and somewhat overwhelmed. she has chronic joint pains.  Allergies (verified): 1)  ! Pcn 2)  ! * Niaspan  Review of Systems      See HPI General:  Complains of fatigue, loss of appetite, malaise, and weakness. Eyes:  Complains of vision loss-both eyes. ENT:  Denies hoarseness, nosebleeds, postnasal drainage, and sinus pressure. CV:  Complains of shortness of breath with exertion; denies chest pain or discomfort and palpitations. Resp:  Denies cough and sputum productive. GI:  Complains of abdominal pain, indigestion, loss of appetite, and nausea. GU:  Complains of nocturia; denies dysuria. MS:  Complains of joint pain,  loss of strength, low back pain, mid back pain, and stiffness. Endo:  Denies excessive thirst and excessive urination. Heme:  Denies abnormal bruising and bleeding. Allergy:  Complains of seasonal allergies.  Physical Exam  General:  Ill appearing elderly female in no C/P distress. HEENT: No facial asymmetry,  EOMI, No sinus tenderness, TM's Clear, oropharynx  pink and moist.   Chest: Clear to auscultation bilaterally. Decreased air entry throughout. CVS: S1, S2, No murmurs, No S3.   Abd: Soft, mild epigastric tenderness, no guarding or rebound MS: decreased  ROM spine, hips, shoulders and knees.  Ext: No edema.   CNS: CN 2-12 intact, power tone and sensation normal throughout.   Skin: Intact, no visible lesions or rashes.  Psych: Good eye contact, normal affect.  Memory intact,  anxious  appearing.    Impression & Recommendations:  Problem # 1:  NAUSEA (ICD-787.02) Assessment Deteriorated Zofran administered  Problem # 2:  FATIGUE (ICD-780.79) Assessment: Deteriorated  Problem # 3:  DIABETES MELLITUS, TYPE II (ICD-250.00) Assessment: Unchanged  Her updated medication list for this problem includes:    Glipizide 5 Mg Tabs (Glipizide) ..... One tab by mouth once daily  Labs Reviewed: Creat: 2.22 (05/27/2010)    Reviewed HgBA1c results: 6.5 (04/04/2010)  6.7 (12/23/2009)  Problem # 4:  HYPERTENSION (ICD-401.9) Assessment: Unchanged  Her updated medication list for this problem includes:    Toprol Xl 100 Mg Tb24 (Metoprolol succinate) ..... One  tab by mouth once daily    Furosemide 40 Mg Tabs (Furosemide) ..... One and one half tabs once daily  BP today: 116/70 Prior BP: 110/60 (05/27/2010)  Labs Reviewed: K+: 4.5 (05/27/2010) Creat: : 2.22 (05/27/2010)   Chol: 101 (02/22/2010)   HDL: 30 (02/22/2010)   LDL: 40 (02/22/2010)   TG: 155 (02/22/2010)  Problem # 5:  DEHYDRATION (ICD-276.51) Assessment: Deteriorated  Orders: T-Basic Metabolic Panel (99991111), PT  WILL NEED HOSPITALISATION FOR FURTHER ASSESMENT AND TREATMENT , IF HER LABS HAVE DETERIORATD  Complete Medication List: 1)  Coumadin 5 Mg Tabs (Warfarin sodium) .... Take 1 tablet by mouth three times a day 2)  Synthroid 75 Mcg Tabs (Levothyroxine sodium) .... One tab by mouth once daily 3)  Oscal 500/200 D-3 500-200 Mg-unit Tabs (Calcium-vitamin d) .... Take 1 tablet by mouth three times a day 4)  Toprol Xl 100 Mg Tb24 (Metoprolol succinate) .... One tab by mouth once daily 5)  Hydrocodone-acetaminophen 5-325 Mg Tabs (Hydrocodone-acetaminophen) .... One tab by mouth once daily 6)  Klor-con M20 20 Meq Tbcr (Potassium chloride crys cr) .... One and half tabs by mouth once daily 7)  Glipizide 5 Mg Tabs (Glipizide) .... One tab by mouth once daily 8)  Vytorin 10-20 Mg Tabs (Ezetimibe-simvastatin) .... One tab by mouth at bedtime 9)  Advair Diskus 100-50 Mcg/dose Misc (Fluticasone-salmeterol) .... Inhale one puff by mouth two times a day 10)  Lanoxin 0.25 Mg Tabs (Digoxin) .... One tab by mouth qd 11)  Folic Acid 1 Mg Tabs (Folic acid) .... Take 1 tablet by mouth once a day 12)  Restoril 15 Mg Caps (Temazepam) .... Take 1 tab by mouth at bedtime 13)  Coumadin 10 Mg Tabs (Warfarin sodium) .... Take 1 tablet by mouth once a day except on thursday 13 mg 14)  Gabapentin 100 Mg Caps (Gabapentin) .... One to three capsules at  night for leg pain and burning 15)  Promethazine Hcl 25 Mg/ml Soln (Promethazine hcl) .... Take 1 tablet by mouth one  time a day as needed 16)  Oxycodone-acetaminophen 5-325 Mg Tabs (Oxycodone-acetaminophen) .... Take 1 tablet by mouth once a day 17)  Furosemide 40 Mg Tabs (Furosemide) .... One and one half tabs once daily 18)  Bidil 20-37.5 Mg Tabs (Isosorb dinitrate-hydralazine) .... Take 1 tablet by mouth two times a day 19)  Sulfamethoxazole-tmp Ds 800-160 Mg Tabs (Sulfamethoxazole-trimethoprim) .... Take 1 two times a day x 10 days for sinus infection  Other Orders: T-TSH  [id number]) Zofran 1mg . injection [id number]) Admin of Therapeutic Inj  intramuscular or subcutaneous [id number])  Patient Instructions: 1)  Please schedule a follow-up appointment in 1 month. 2)  BMP prior to visit, ICD-9:  stat today 3)  tSH today not stat 4)  injection for nausea today   Medication Administration  Injection # 1:    Medication: Zofran 1mg . injection    Diagnosis: NAUSEA (ICD-787.02)    Route: IM    Site: RUOQ gluteus    Exp Date: 10/12    Lot #: [id number]    Mfr: [email or web address]    Comments: zofran 4mg  given    Patient tolerated injection without complications    Given by: Baldomero Lamy LPN (September 13, 624THL 3:06 PM)  Orders Added: 1)  Est. Patient Level IV [id number] 2)  T-Basic Metabolic Panel 0000000 3)  T-TSH [id number] 4)  Zofran 1mg . injection [[id number]] 5)  Admin of Therapeutic Inj  intramuscular or subcutaneous [id number]

## 2010-10-25 NOTE — Progress Notes (Signed)
  Phone Note Call from Patient   Caller: Patient Summary of Call: requesting temazepma increased to 30mg  from 15mg  ok per dr simpson  Initial call taken by: Jimmey Ralph LPN,  February  9, 624THL 3:34 PM  Follow-up for Phone Call        Rx Called In Follow-up by: Jimmey Ralph LPN,  February  9, 624THL 3:34 PM    New/Updated Medications: TEMAZEPAM 30 MG CAPS (TEMAZEPAM) one cap by mouth at bedtime Prescriptions: TEMAZEPAM 30 MG CAPS (TEMAZEPAM) one cap by mouth at bedtime  #30 x 2   Entered by:   Jimmey Ralph LPN   Authorized by:   [name] MD   Signed by:   Jimmey Ralph LPN on 624THL   Method used:   Printed then faxed to ...       [facility] (retail)       [address]       [place], [place]  [address]       Ph: [phone]       Fax: [phone]   RxID:   [id number]

## 2010-10-25 NOTE — Assessment & Plan Note (Signed)
Summary: office visit   Vital Signs:  Patient Profile:   77 Years Old Female Height:     66.5 inches Weight:      244 pounds BMI:     38.93 BSA:     2.19 O2 Sat:      98 % Pulse rate:   76 / minute Resp:     16 per minute BP sitting:   110 / 60  (left arm)  Vitals Entered By: Kate Sable (November 10, 2008 12:54 PM)                 Chief Complaint:  Follow up chronic problems, headache, and eye pain.  History of Present Illness: Patient reports doing well.  Denies any recent fever or chills.  Denies any appetite change or change in bowel movements. Patient denies depression, anxiety or insomnia. Pt reports intermittent frontal headache with periorbital discomfort over the last several weeks.she states also that her vision seems impaired since the change in her digoxin tablet, I advised that a blood test would readily let us know if this was indeed a problem. She denies polyuria, polydypsia or hypoglycemic episodes.    Updated Prior Medication List: BIDIL 20-37.5 MG  TABS (ISOSORB DINITRATE-HYDRALAZINE) one tab by mouth three times a day COUMADIN 5 MG TABS (WARFARIN SODIUM) Take 1 tablet by mouth three times a day SYNTHROID 75 MCG  TABS (LEVOTHYROXINE SODIUM) one tab by mouth once daily LASIX 40 MG  TABS (FUROSEMIDE) one tab by mouth once daily ACIPHEX 20 MG  TBEC (RABEPRAZOLE SODIUM) one tab by mouth once daily CALCIUM 600 1500 MG  TABS (CALCIUM CARBONATE) one tab by mouth once daily TOPROL XL 100 MG  TB24 (METOPROLOL SUCCINATE) one tab by mouth once daily HYDROCODONE-ACETAMINOPHEN 5-325 MG  TABS (HYDROCODONE-ACETAMINOPHEN) one tab by mouth once daily KLOR-CON M20 20 MEQ  TBCR (POTASSIUM CHLORIDE CRYS CR) one and half tabs by mouth once daily GLIPIZIDE 5 MG  TABS (GLIPIZIDE) one tab by mouth once daily VYTORIN 10-20 MG  TABS (EZETIMIBE-SIMVASTATIN) one tab by mouth at bedtime TUSSIONEX PENNKINETIC ER 8-10 MG/5ML LQCR (CHLORPHENIRAMINE-HYDROCODONE) Take 5cc two times a  day as needed PROMETHAZINE HCL 25 MG TABS (PROMETHAZINE HCL) one tab by mouth two times a day prn ADVAIR DISKUS 100-50 MCG/DOSE MISC (FLUTICASONE-SALMETEROL) Inhale one puff by mouth two times a day LANOXIN 0.25 MG TABS (DIGOXIN) one tab by mouth qd FOLIC ACID 1 MG TABS (FOLIC ACID) Take 1 tablet by mouth once a day RESTORIL 15 MG CAPS (TEMAZEPAM) Take 1 tab by mouth at bedtime  Current Allergies: ! PCN ! * NIASPAN     Review of Systems  Eyes      Complains of blurring.      Right eye blurry vision for the past 3 weeks  ENT      Denies hoarseness, nasal congestion, sinus pressure, and sore throat.  CV      Denies chest pain or discomfort, palpitations, and swelling of feet.  Resp      Denies cough, sputum productive, and wheezing.  GI      Denies abdominal pain, constipation, diarrhea, nausea, and vomiting.  GU      Denies dysuria and urinary frequency.  MS      Complains of cramps.      left neck spasm  Derm      Denies itching, lesion(s), and rash.  Psych      Denies anxiety and depression.  Endo      Denies  cold intolerance, excessive hunger, excessive thirst, excessive urination, heat intolerance, polyuria, and weight change.  Heme      Denies abnormal bruising and bleeding.   Physical Exam  General:     HEENT: No facial asymmetry,  EOMI, No sinus tenderness, TM's Clear, oropharynx  pink and moist.   Chest: Clear to auscultation bilaterally.  CVS: S1, S2, No murmurs, No S3.   Abd: Soft, Nontender.  MS: Adequate ROM spine, hips, shoulders and knees. decreased ROM neck with left spasm Ext: No edema.   CNS: CN 2-12 intact, power tone and sensation normal throughout.   Skin: Intact, no visible lesions or rashes.  Psych: Good eye contact, normal effect.  Memory intact, not anxious or depressed appearing.   Diabetes Management Exam:    Foot Exam (with socks and/or shoes not present):       Sensory-Monofilament:          Left foot: normal           Right foot: normal       Inspection:          Left foot: normal          Right foot: normal       Nails:          Left foot: normal          Right foot: normal    Impression & Recommendations:  Problem # 1:  DIABETES MELLITUS, TYPE II (ICD-250.00) Assessment: Unchanged  Her updated medication list for this problem includes:    Glipizide 5 Mg Tabs (Glipizide) ..... One tab by mouth once daily  Orders: Glucose, (CBG) (82962) Hemoglobin A1C (83036)  Labs Reviewed: HgBA1c: 6.4 (11/10/2008)   Creat: 1.90 (07/27/2008)   Microalbumin: 4.76 (07/31/2008)   Problem # 2:  HYPERLIPIDEMIA (ICD-272.4) Assessment: Comment Only  Her updated medication list for this problem includes:    Vytorin 10-20 Mg Tabs (Ezetimibe-simvastatin) ..... One tab by mouth at bedtime  Labs Reviewed: Chol: 103 (07/27/2008)   HDL: 38 (07/27/2008)   LDL: 46 (07/27/2008)   TG: 95 (07/27/2008) SGOT: 20 (07/27/2008)   SGPT: 10 (07/27/2008)   Problem # 3:  HYPOTHYROIDISM (ICD-244.9) Assessment: Unchanged  Her updated medication list for this problem includes:    Synthroid 75 Mcg Tabs (Levothyroxine sodium) ..... One tab by mouth once daily  Labs Reviewed: TSH: 1.902 (09/16/2008)    HgBA1c: 6.4 (11/10/2008) Chol: 103 (07/27/2008)   HDL: 38 (07/27/2008)   LDL: 46 (07/27/2008)   TG: 95 (07/27/2008)   Problem # 4:  HYPERTENSION (ICD-401.9) Assessment: Unchanged  Her updated medication list for this problem includes:    Lasix 40 Mg Tabs (Furosemide) ..... One tab by mouth once daily    Toprol Xl 100 Mg Tb24 (Metoprolol succinate) ..... One tab by mouth once daily  BP today: 110/60 Prior BP: 110/70 (09/01/2008)  Labs Reviewed: Creat: 1.90 (07/27/2008) Chol: 103 (07/27/2008)   HDL: 38 (07/27/2008)   LDL: 46 (07/27/2008)   TG: 95 (07/27/2008)   Problem # 5:  NECK SPASM (ICD-847.0) Assessment: Comment Only  Her updated medication list for this problem includes:    Hydrocodone-acetaminophen 5-325 Mg  Tabs (Hydrocodone-acetaminophen) ..... One tab by mouth once daily    Amrix 15 Mg Xr24h-cap (Cyclobenzaprine hcl) .Marland Kitchen... Take 1 tablet by mouth once a day    Cyclobenzaprine Hcl 10 Mg Tabs (Cyclobenzaprine hcl) .Marland Kitchen... Take 1 tab by mouth at bedtime   Complete Medication List: 1)  Bidil 20-37.5 Mg Tabs (Isosorb dinitrate-hydralazine) .Marland KitchenMarland KitchenMarland Kitchen  One tab by mouth three times a day 2)  Coumadin 5 Mg Tabs (Warfarin sodium) .... Take 1 tablet by mouth three times a day 3)  Synthroid 75 Mcg Tabs (Levothyroxine sodium) .... One tab by mouth once daily 4)  Lasix 40 Mg Tabs (Furosemide) .... One tab by mouth once daily 5)  Aciphex 20 Mg Tbec (Rabeprazole sodium) .... One tab by mouth once daily 6)  Calcium 600 1500 Mg Tabs (Calcium carbonate) .... One tab by mouth once daily 7)  Toprol Xl 100 Mg Tb24 (Metoprolol succinate) .... One tab by mouth once daily 8)  Hydrocodone-acetaminophen 5-325 Mg Tabs (Hydrocodone-acetaminophen) .... One tab by mouth once daily 9)  Klor-con M20 20 Meq Tbcr (Potassium chloride crys cr) .... One and half tabs by mouth once daily 10)  Glipizide 5 Mg Tabs (Glipizide) .... One tab by mouth once daily 11)  Vytorin 10-20 Mg Tabs (Ezetimibe-simvastatin) .... One tab by mouth at bedtime 12)  Tussionex Pennkinetic Er 8-10 Mg/71ml Lqcr (Chlorpheniramine-hydrocodone) .... Take 5cc two times a day as needed 13)  Promethazine Hcl 25 Mg Tabs (Promethazine hcl) .... One tab by mouth two times a day prn 14)  Advair Diskus 100-50 Mcg/dose Misc (Fluticasone-salmeterol) .... Inhale one puff by mouth two times a day 15)  Lanoxin 0.25 Mg Tabs (Digoxin) .... One tab by mouth qd 16)  Folic Acid 1 Mg Tabs (Folic acid) .... Take 1 tablet by mouth once a day 17)  Restoril 15 Mg Caps (Temazepam) .... Take 1 tab by mouth at bedtime 18)  Amrix 15 Mg Xr24h-cap (Cyclobenzaprine hcl) .... Take 1 tablet by mouth once a day 19)  Coumadin 10 Mg Tabs (Warfarin sodium) .... Take 1 tablet by mouth once a day except on  thursday 13 mg 20)  Cyclobenzaprine Hcl 10 Mg Tabs (Cyclobenzaprine hcl) .... Take 1 tab by mouth at bedtime  Other Orders: T-Digoxin [id number])   Patient Instructions: 1)  Please schedule a follow-up appointment in 3 months. 2)  It is important that you exercise regularly at least 20 minutes 5 times a week. If you develop chest pain, have severe difficulty breathing, or feel very tired , stop exercising immediately and seek medical attention. 3)  You need to lose weight. Consider a lower calorie diet and regular exercise.  4)  Check your Blood Pressure regularly. If it is above150/95  you should make an appointment. 5)  Check your blood sugars regularly. If your readings are usually above : or below 70 you should contact our office. 6)  digoxin level today copy to Dr Rollene Fare. 7)  You will receive samples  of amrix a muscle relaxant  to take 1 at bedtim as needed   Prescriptions: SYNTHROID 75 MCG  TABS (LEVOTHYROXINE SODIUM) one tab by mouth once daily  #90 x 5   Entered by:   Kate Sable   Authorized by:   [name] MD   Signed by:   Kate Sable on 11/11/2008   Method used:   Printed then faxed to ...       [facility] (mail-order)       [place]  [address]       Ph: [phone]       Fax: [phone]   [id number] COUMADIN 5 MG TABS (WARFARIN SODIUM) Take 1 tablet by mouth three times a day  #270 x 5   Entered by:   Kate Sable   Authorized  by:   [name] MD   Signed by:   Kate Sable on 11/11/2008   Method used:   Printed then faxed to ...       [facility] (mail-[place]  [address]       Ph: [phone]       Fax: [phone]   [id number] BIDIL 20-37.5 MG  TABS (ISOSORB DINITRATE-HYDRALAZINE) one tab by mouth three times a day  #270 x 5   Entered by:   Kate Sable   Authorized by:   [name] MD   Signed by:   Kate Sable on 11/11/2008   Method used:   Printed then faxed to  ...       [facility] (mail-[place]  [address]       Ph: [phone]       Fax: [phone]   [id number] SYNTHROID 75 MCG  TABS (LEVOTHYROXINE SODIUM) one tab by mouth once daily  #90 x 5   Entered by:   Kate Sable   Authorized by:   [name] MD   Signed by:   Kate Sable on 11/11/2008   Method used:   Printed then faxed to ...       [facility] (mail-[place]  [address]       Ph: [phone]       Fax: [phone]   [id number] COUMADIN 5 MG TABS (WARFARIN SODIUM) Take 1 tablet by mouth three times a day  #270 x 5   Entered by:   Kate Sable   Authorized by:   [name] MD   Signed by:   Kate Sable on 11/11/2008   Method used:   Printed then faxed to ...       [facility] (mail-order)       [place]  [address]       Ph: [phone]       Fax: [phone]   [id number] BIDIL 20-37.5 MG  TABS (ISOSORB DINITRATE-HYDRALAZINE) one tab by mouth three times a day  #270 x 3   Entered by:   Kate Sable   Authorized by:   [name] MD   Signed by:   Kate Sable on 11/11/2008   Method used:   Printed then faxed to ...       [facility] (mail-order)       [place]  [address]       Ph: [phone]       Fax: [phone]   [id number] COUMADIN 5 MG TABS (WARFARIN SODIUM) Take 1 tablet by mouth three times a day  #270 x 5   Entered by:   Kate Sable   Authorized by:   [name] MD   Signed by:   Kate Sable on 11/11/2008   Method used:   Printed then faxed to ...       [facility] (mail-order)       [place]  [address]       Ph: [phone]       Fax: [phone]  [id number] SYNTHROID 75 MCG  TABS (LEVOTHYROXINE SODIUM) one tab by mouth once daily  #90 x 5   Entered by:   Kate Sable   Authorized by:   [name] MD   Signed by:   Kate Sable on 11/11/2008   Method used:    Printed then faxed to ...       [facility] (mail-[place]  [address]       Ph: [phone]       Fax: [phone]   [id number] BIDIL 20-37.5 MG  TABS (ISOSORB DINITRATE-HYDRALAZINE) one tab by mouth three times a day  #270 x 3   Entered by:   Kate Sable   Authorized by:   [name] MD   Signed by:   Kate Sable on 11/11/2008   Method used:   Printed then faxed to ...       [facility] (mail-order)       [place]  [address]       Ph: [phone]       Fax: [phone]   [id number] CYCLOBENZAPRINE HCL 10 MG TABS (CYCLOBENZAPRINE HCL) Take 1 tab by mouth at bedtime  #30 x 3   Entered by:   Kate Sable   Authorized by:   [name] MD   Signed by:   Kate Sable on 11/11/2008   Method used:   Printed then faxed to ...       [facility] (mail-order)       [place]  [address]       Ph: [phone]       Fax: [phone]   [id number] BIDIL 20-37.5 MG  TABS (ISOSORB DINITRATE-HYDRALAZINE) one tab by mouth three times a day  #270 x 3   Entered by:   Kate Sable   Authorized by:   [name] MD   Signed by:   Kate Sable on 11/11/2008   Method used:   Faxed to ...       [facility] (mail-order)       [place]  [address]       Ph: [phone]       Fax: [phone]   [id number] SYNTHROID 75 MCG  TABS (LEVOTHYROXINE SODIUM) one tab by mouth once daily  #90 x 3   Entered by:   Kate Sable   Authorized by:   [name] MD   Signed by:   Kate Sable on 11/11/2008   Method used:   Faxed to ...       [facility] (mail-[place]  [address]       Ph: [phone]       Fax: [phone]   RxID:   [id number]   Laboratory Results   Blood Tests   Date/Time Received: November 10, 2008 2pm Date/Time Reported: November 10, 2008   Glucose (random): 102 mg/dL   (Normal Range: 70-105) HGBA1C: 6.4%    (Normal Range: Non-Diabetic - 3-6%   Control Diabetic - 6-8%)

## 2010-10-25 NOTE — Progress Notes (Signed)
  Phone Note Call from Patient   Summary of Call: Dalaila has been having a problems with her mail order company sending her Digoxin. She said she used to get Lanoxin but last month they changed it to Digoxin and she doesn't want that and they told her that she would have to get her doctor to change it back. Initial call taken by: Kate Sable,  November 20, 2008 11:47 AM  Follow-up for Phone Call        RX sent to pharmacy Follow-up by: Kate Sable,  November 20, 2008 12:03 PM

## 2010-10-25 NOTE — Progress Notes (Signed)
  Phone Note Call from Patient   Caller: Patient Summary of Call: pt requests samples of all meds available Initial call taken by: Rennie Plowman,  April 27, 2008 10:54 AM  Follow-up for Phone Call        advised pt samples up front for pup Follow-up by: Rennie Plowman,  April 27, 2008 10:54 AM      Prescriptions: VYTORIN 10-20 MG  TABS (EZETIMIBE-SIMVASTATIN) one tab by mouth at bedtime  #42 x 0   Entered by:   Rennie Plowman   Authorized by:   [name] MD   Signed by:   Rennie Plowman on 04/27/2008   Method used:   Samples Given   RxID:   [id number] ACIPHEX 20 MG  TBEC (RABEPRAZOLE SODIUM) one tab by mouth once daily  #9 x 0   Entered by:   Rennie Plowman   Authorized by:   [name] MD   Signed by:   Rennie Plowman on 04/27/2008   Method used:   Samples Given   RxID:   [id number]

## 2010-10-25 NOTE — Medication Information (Signed)
Summary: [employer]   Imported By: Dierdre Harness 07/17/2008 13:35:57  _____________________________________________________________________  External Attachment:    Type:   Image     Comment:   External Document

## 2010-10-25 NOTE — Progress Notes (Signed)
Summary: ADVAIR NEEDS REFILLS  Phone Note Call from Patient   Summary of Call: NEEDS ADVAIR SENT TO [place] REFILLS  ON THIS   CALL HER BACK TO LET HERE NOW Initial call taken by: Dierdre Harness,  September 06, 2009 10:16 AM  Follow-up for Phone Call        Phone Call Completed Follow-up by: Jimmey Ralph LPN,  December 13, 624THL 10:32 AM    Prescriptions: ADVAIR DISKUS 100-50 MCG/DOSE MISC (FLUTICASONE-SALMETEROL) Inhale one puff by mouth two times a day  #60 Each x 4   Entered by:   Jimmey Ralph LPN   Authorized by:   [name] MD   Signed by:   Jimmey Ralph LPN on 624THL   Method used:   Electronically to        [facility] (retail)       [address]       [place], [place]  [address]       Ph: [phone]       Fax: [phone]   RxID:   [id number]

## 2010-10-25 NOTE — Letter (Signed)
Summary: dr. Neldon Mc  dr. Darrick Meigs streck   Imported By: Dierdre Harness 09/02/2010 13:35:10  _____________________________________________________________________  External Attachment:    Type:   Image     Comment:   External Document

## 2010-10-25 NOTE — Progress Notes (Signed)
Summary: neb  Phone Note Call from Patient   Summary of Call: needs nebulizer machine sent to [place] [facility] same as [facility] Initial call taken by: Dierdre Harness,  July 21, 2010 1:27 PM  Follow-up for Phone Call        prescription written, pls let pt know and fax Follow-up by: [name] MD,  July 21, 2010 5:16 PM  Additional Follow-up for Phone Call Additional follow up Details #1::        patient aware Additional Follow-up by: Baldomero Lamy LPN,  October 28, 624THL 2:12 PM

## 2010-10-25 NOTE — Miscellaneous (Signed)
  Clinical Lists Changes  Medications: Added new medication of SYNTHROID 75 MCG TABS (LEVOTHYROXINE SODIUM) Take 1 tablet by mouth once a day - Signed Rx of SYNTHROID 75 MCG TABS (LEVOTHYROXINE SODIUM) Take 1 tablet by mouth once a day;  #3 x 5;  Signed;  Entered by: [name] MD;  Authorized by: [name] MD;  Method used: Electronically to [facility] [address] [place], [place], [place]  [address], Ph: [phone], Fax: [phone]    Prescriptions: SYNTHROID 75 MCG TABS (LEVOTHYROXINE SODIUM) Take 1 tablet by mouth once a day  #3 x 5   Entered and Authorized by:   [name] MD   Signed by:   [name] MD on 03/03/2010   Method used:   Electronically to        [facility] (retail)       [address]       [place],   [address]       Ph: [phone]       Fax: [phone]   RxID:   [id number]

## 2010-10-25 NOTE — Progress Notes (Signed)
  Phone Note Call from Patient   Caller: Patient Summary of Call: needs 30 supply of lanoxin [facility] to [facility], states the digoxin may be interfering with her head and vision. wld like to try lanoxin Initial call taken by: Jimmey Ralph LPN,  February  8, 624THL 3:48 PM  Follow-up for Phone Call        advise her that I suggest she call her cardiologist to change this drug, since she has a very "special " heart Follow-up by: [name] MD,  November 02, 2008 4:57 PM  Additional Follow-up for Phone Call Additional follow up Details #1::        Phone Call Completed Additional Follow-up by: Jimmey Ralph LPN,  February  8, 624THL 5:00 PM

## 2010-10-27 NOTE — Letter (Signed)
Summary: christian streck md  christian streck md   Imported By: Dierdre Harness 09/07/2010 10:20:18  _____________________________________________________________________  External Attachment:    Type:   Image     Comment:   External Document

## 2010-10-27 NOTE — Letter (Signed)
Summary: [facility]   Imported By: Dierdre Harness 10/05/2010 10:45:17  _____________________________________________________________________  External Attachment:    Type:   Image     Comment:   External Document

## 2010-10-27 NOTE — Letter (Signed)
[facility]   Imported By: Dierdre Harness 10/06/2010 10:19:59  _____________________________________________________________________  External Attachment:    Type:   Image     Comment:   External Document

## 2010-10-27 NOTE — Letter (Signed)
Summary: [facility]  [facility]   Imported By: Dierdre Harness 10/06/2010 13:54:08  _____________________________________________________________________  External Attachment:    Type:   Image     Comment:   External Document

## 2010-11-10 NOTE — Assessment & Plan Note (Signed)
Summary: f up   Vital Signs:  Patient profile:   77 year old female Menstrual status:  postmenopausal Height:      66.5 inches Weight:      251 pounds BMI:     40.05 O2 Sat:      92 % Pulse rate:   84 / minute Pulse rhythm:   regular Resp:     16 per minute BP sitting:   110 / 70  (left arm)  Vitals Entered By: Kate Sable LPN (January 24, [id number] 1:00 PM)  Nutrition Counseling: Patient's BMI is greater than 25 and therefore counseled on weight management options. CC: Follow up chronic problems   Primary Care Provider:  [name] MD  CC:  Follow up chronic problems.  History of Present Illness: Reports  that she has been fairly well. Denies recent fever or chills. Denies sinus pressure, nasal congestion , ear pain or sore throat. Denies chest congestion, or cough productive of sputum. Denies chest pain, palpitations, PND, orthopnea or leg swelling. Denies abdominal pain, nausea, vomitting, diarrhea or constipation. Denies change in bowel movements or bloody stool. Denies dysuria , frequency, incontinence or hesitancy.  Denies headaches, vertigo, seizures. Denies depression, anxiety or insomnia. Denies  rash, lesions, or itch.     Current Medications (verified): 1)  Coumadin 5 Mg Tabs (Warfarin Sodium) .... Take 1 Tablet By Mouth Three Times A Day 2)  Synthroid 75 Mcg  Tabs (Levothyroxine Sodium) .... One Tab By Mouth Once Daily 3)  Oscal 500/200 D-3 500-200 Mg-Unit Tabs (Calcium-Vitamin D) .... Take 1 Tablet By Mouth Three Times A Day 4)  Toprol Xl 100 Mg  Tb24 (Metoprolol Succinate) .... One Tab By Mouth Once Daily 5)  Hydrocodone-Acetaminophen 5-325 Mg  Tabs (Hydrocodone-Acetaminophen) .... One Tab By Mouth Once Daily 6)  Klor-Con M20 20 Meq  Tbcr (Potassium Chloride Crys Cr) .... One and Half Tabs By Mouth Once Daily 7)  Glipizide 5 Mg  Tabs (Glipizide) .... One Tab By Mouth Once Daily 8)  Vytorin 10-20 Mg  Tabs (Ezetimibe-Simvastatin) .... One Tab By Mouth At  Bedtime 9)  Advair Diskus 100-50 Mcg/dose Misc (Fluticasone-Salmeterol) .... Inhale One Puff By Mouth Two Times A Day 10)  Lanoxin 0.25 Mg Tabs (Digoxin) .... One Tab By Mouth Once Daily. 11)  Folic Acid 1 Mg Tabs (Folic Acid) .... Take 1 Tablet By Mouth Once A Day 12)  Coumadin 10 Mg Tabs (Warfarin Sodium) .... Take 1 Tablet By Mouth Once A Day Except On Thursday 13 Mg 13)  Gabapentin 100 Mg Caps (Gabapentin) .... One To Three Capsules At  Night For Leg Pain and Burning 14)  Promethazine Hcl 25 Mg/ml Soln (Promethazine Hcl) .... Take 1 Tablet By Mouth One  Time A Day As Needed 15)  Furosemide 40 Mg Tabs (Furosemide) .... One and One Half Tabs Once Daily 16)  Bidil 20-37.5 Mg Tabs (Isosorb Dinitrate-Hydralazine) .... Take 1 Tablet By Mouth Two Times A Day 17)  Promethazine Hcl 25 Mg Tabs (Promethazine Hcl) .... One Tab By Mouth Once Daily As Needed 18)  Temazepam 30 Mg Caps (Temazepam) .... Take 1 Capsule By Mouth At Bedtime As Needed For Sleep 19)  Centrum  Liqd (Multiple Vitamins-Minerals) .... One Teaspoon Twice Daily 20)  Colace 100 Mg Caps (Docusate Sodium) .... As Needed 21)  Losartan Potassium 50 Mg Tabs (Losartan Potassium) .... Take 1 Tablet By Mouth Once A Day 22)  Ferrous Sulfate 325 (65 Fe) Mg Tabs (Ferrous Sulfate) .... 3 Times A  Week  Allergies (verified): 1)  ! Pcn 2)  ! * Niaspan  Review of Systems      See HPI General:  Complains of fatigue. Eyes:  Complains of vision loss-both eyes. MS:  Complains of joint pain, low back pain, mid back pain, and stiffness. Neuro:  Complains of headaches; left posterior headache x 3 weeks, wants meds, left eye bulging, does not want a scan. Psych:  Complains of anxiety; denies easily angered, mental problems, suicidal thoughts/plans, thoughts of violence, and unusual visions or sounds. Endo:  Denies cold intolerance, excessive hunger, excessive thirst, excessive urination, and heat intolerance. Heme:  Denies abnormal bruising, bleeding,  and fevers. Allergy:  Denies hives or rash and itching eyes.  Physical Exam  General:  Well-developed,obese, elderly female,in no acute distress; alert,appropriate and cooperative throughout examination HEENT: No facial asymmetry,  EOMI, No sinus tenderness, TM's Clear, oropharynx  pink and moist.   Chest: Clear to auscultation bilaterally.  CVS: S1, S2, No murmurs, No S3.   Abd: Soft, Nontender.  [id number]  ROM spine, hips, shoulders and knees.  Ext: No edema.   CNS: CN 2-12 intact, power tone and sensation normal throughout.   Skin: Intact, no visible lesions or rashes.  Psych: Good eye contact, normal affect.  Memory intact, not anxious or depressed appearing.   Diabetes Management Exam:    Foot Exam (with socks and/or shoes not present):       Sensory-Monofilament:          Left foot: diminished          Right foot: diminished       Inspection:          Left foot: normal          Right foot: normal       Nails:          Left foot: normal          Right foot: normal   Impression & Recommendations:  Problem # 1:  HEADACHE (ICD-784.0) Assessment Deteriorated  The following medications were removed from the medication list:    Oxycodone-acetaminophen 5-325 Mg Tabs (Oxycodone-acetaminophen) .Marland Kitchen... Take 1 tablet by mouth once a day Her updated medication list for this problem includes:    Toprol Xl 100 Mg Tb24 (Metoprolol succinate) ..... One tab by mouth once daily    Hydrocodone-acetaminophen 5-325 Mg Tabs (Hydrocodone-acetaminophen) ..... One tab by mouth once daily    Fioricet 50-325-40 Mg Tabs (Butalbital-apap-caffeine) ..... One tablet twice dailyas needed for headache, max 4 tabs in 1 week  Orders: Medicare Electronic Prescription [phone])  Problem # 2:  INSOMNIA (ICD-780.52) Assessment: Unchanged  Her updated medication list for this problem includes:    Temazepam 30 Mg Caps (Temazepam) .Marland Kitchen... Take 1 capsule by mouth at bedtime as needed for sleep  Discussed  sleep hygiene.   Problem # 3:  DIABETES MELLITUS, TYPE II (ICD-250.00) Assessment: Deteriorated  Her updated medication list for this problem includes:    Glipizide 5 Mg Tabs (Glipizide) ..... One tab by mouth once daily    Losartan Potassium 50 Mg Tabs (Losartan potassium) .Marland Kitchen... Take 1 tablet by mouth once a day Patient advised to reduce carbs and sweets, commit to regular physical activity, take meds as prescribed, test blood sugars as directed, and attempt to lose weight , to improve blood sugar control.  Orders: T- Hemoglobin A1C [id number])  Labs Reviewed: Creat: 1.90 (09/20/2010)    Reviewed HgBA1c results: 6.9 (09/20/2010)  6.3 (07/21/2010)  Problem #  4:  HYPOTHYROIDISM (ICD-244.9) Assessment: Unchanged  Her updated medication list for this problem includes:    Synthroid 75 Mcg Tabs (Levothyroxine sodium) ..... One tab by mouth once daily  Orders: T-TSH [phone])  Labs Reviewed: TSH: 1.432 (09/20/2010)    HgBA1c: 6.9 (09/20/2010) Chol: 101 (02/22/2010)   HDL: 30 (02/22/2010)   LDL: 40 (02/22/2010)   TG: 155 (02/22/2010)  Complete Medication List: 1)  Coumadin 5 Mg Tabs (Warfarin sodium) .... Take 1 tablet by mouth three times a day 2)  Synthroid 75 Mcg Tabs (Levothyroxine sodium) .... One tab by mouth once daily 3)  Oscal 500/200 D-3 500-200 Mg-unit Tabs (Calcium-vitamin d) .... Take 1 tablet by mouth three times a day 4)  Toprol Xl 100 Mg Tb24 (Metoprolol succinate) .... One tab by mouth once daily 5)  Hydrocodone-acetaminophen 5-325 Mg Tabs (Hydrocodone-acetaminophen) .... One tab by mouth once daily 6)  Klor-con M20 20 Meq Tbcr (Potassium chloride crys cr) .... One and half tabs by mouth once daily 7)  Glipizide 5 Mg Tabs (Glipizide) .... One tab by mouth once daily 8)  Vytorin 10-20 Mg Tabs (Ezetimibe-simvastatin) .... One tab by mouth at bedtime 9)  Advair Diskus 100-50 Mcg/dose Misc (Fluticasone-salmeterol) .... Inhale one puff by mouth two times a day 10)   Lanoxin 0.25 Mg Tabs (Digoxin) .... One tab by mouth once daily. 11)  Folic Acid 1 Mg Tabs (Folic acid) .... Take 1 tablet by mouth once a day 12)  Coumadin 10 Mg Tabs (Warfarin sodium) .... Take 1 tablet by mouth once a day except on thursday 13 mg 13)  Gabapentin 100 Mg Caps (Gabapentin) .... One to three capsules at  night for leg pain and burning 14)  Promethazine Hcl 25 Mg/ml Soln (Promethazine hcl) .... Take 1 tablet by mouth one  time a day as needed 15)  Furosemide 40 Mg Tabs (Furosemide) .... One and one half tabs once daily 16)  Bidil 20-37.5 Mg Tabs (Isosorb dinitrate-hydralazine) .... Take 1 tablet by mouth two times a day 17)  Promethazine Hcl 25 Mg Tabs (Promethazine hcl) .... One tab by mouth once daily as needed 18)  Temazepam 30 Mg Caps (Temazepam) .... Take 1 capsule by mouth at bedtime as needed for sleep 19)  Centrum Liqd (Multiple vitamins-minerals) .... One teaspoon twice daily 20)  Colace 100 Mg Caps (Docusate sodium) .... As needed 21)  Losartan Potassium 50 Mg Tabs (Losartan potassium) .... Take 1 tablet by mouth once a day 22)  Ferrous Sulfate 325 (65 Fe) Mg Tabs (Ferrous sulfate) .... 3 times a week 23)  Fioricet 50-325-40 Mg Tabs (Butalbital-apap-caffeine) .... One tablet twice dailyas needed for headache, max 4 tabs in 1 week  Other Orders: T-Basic Metabolic Panel ([id number]) T-Lipid Profile [phone])  Patient Instructions: 1)  Please schedule a follow-up appointment in 3.5 months. 2)  BMP prior to visit, ICD-9 and eGFR: 3)  Lipid Panel prior to visit, ICD-9   fasting in 3.5 months 4)  HbgA1C prior to visit, ICD-9: 5)  TSH prior to visit, ICD-9: 6)  It is important that you exercise regularly at least 30 minutes 5 times a week. If you develop chest pain, have severe difficulty breathing, or feel very tired , stop exercising immediately and seek medical attention. 7)  You need to lose weight. Consider a lower calorie diet and regular exercise.  8)  Check  your blood sugars regularly. If your readings are usually above 250 or below 70 you should contact our office. Prescriptions:  PROMETHAZINE HCL 25 MG/ML SOLN (PROMETHAZINE HCL) Take 1 tablet by mouth one  time a day as needed  #90 x 1   Entered by:   Baldomero Lamy LPN   Authorized by:   [name] MD   Signed by:   Baldomero Lamy LPN on 624THL   Method used:   Electronically to        [facility] (retail)       [address]       [place], [place]  [address]       Ph: [phone]       Fax: [phone]   RxID:   [id number] GABAPENTIN 100 MG CAPS (GABAPENTIN) one to three capsules at  night for leg pain and burning  #90 Capsule x 1   Entered by:   Baldomero Lamy LPN   Authorized by:   [name] MD   Signed by:   Baldomero Lamy LPN on 624THL   Method used:   Electronically to        [facility] (retail)       [address]       [place], [place]  [address]       Ph: [phone]       Fax: [phone]   [id number] GLIPIZIDE 5 MG  TABS (GLIPIZIDE) one tab by mouth once daily  #90 Tablet x 1   Entered by:   Baldomero Lamy LPN   Authorized by:   [name] MD   Signed by:   Baldomero Lamy LPN on 624THL   Method used:   Electronically to        [facility] (retail)       [address]       [place], [place]  [address]       Ph: [phone]       Fax: [phone]   RxID:   [id number] KLOR-CON M20 20 MEQ  TBCR (POTASSIUM CHLORIDE CRYS CR) one and half tabs by mouth once daily  #135 x 1   Entered by:   Baldomero Lamy LPN   Authorized by:   [name] MD   Signed by:   Baldomero Lamy LPN on 624THL   Method used:   Electronically to        [facility] (retail)       [address]       [place], [place]  [address]       Ph: [phone]       Fax: [phone]   RxID:   [id number] TOPROL XL 100 MG   TB24 (METOPROLOL SUCCINATE) one tab by mouth once daily  #90 x 1   Entered by:   Baldomero Lamy LPN   Authorized by:   [name] MD   Signed by:   Baldomero Lamy LPN on 624THL   Method used:   Electronically to        [facility] (retail)       [address]       [place], [place]  [address]       Ph: [phone]       Fax: [phone]   RxID:   [id number]  MCG  TABS (LEVOTHYROXINE SODIUM) one tab by mouth once daily  #90 x 1   Entered by:   Baldomero Lamy LPN   Authorized by:   [name] MD   Signed by:   Baldomero Lamy LPN on 624THL   Method used:   Electronically to        [facility] (retail)       [address]       [place], [place]  [address]       Ph: [phone]       Fax: [phone]   RxID:   [id number] FIORICET 50-325-40 MG TABS [facility]) one tablet twice dailyas needed for headache, max 4 tabs in 1 week  #20 x 0   Entered and Authorized by:   [name] MD   Signed by:   [name] MD on 10/18/2010   Method used:   Electronically to        [facility] (retail)       [address]       [place],   [address]       Ph: [phone]       Fax: [phone]   RxID:   [id number] ADVAIR DISKUS 100-50 MCG/DOSE MISC (FLUTICASONE-SALMETEROL) Inhale one puff by mouth two times a day  #1 x 0   Entered by:   Kate Sable LPN   Authorized by:   [name] MD   Signed by:   [name] MD on 10/18/2010   Method used:   Samples Given   RxID:   [id number]    Orders Added: 1)  Est. Patient Level IV [id number] 2)  Medicare Electronic Prescription [id number] 3)  T-Basic Metabolic Panel [id number] 4)  T-Lipid Profile [[id number]] 5)  T- Hemoglobin A1C [[id number]] 6)  T-TSH [id number]

## 2010-11-21 IMAGING — CT CT ORBIT/TEMPORAL/IAC W/ CM
3 of 5 series · 13 of 47 positions shown, 15 images · IV contrast (Omnipaque 300)
Comparison: None

CLINICAL DATA: Proptosis on the left.  Surgery as a child.

CT ORBITS WITH CONTRAST
TECHNIQUE: Multidetector CT imaging of the orbits was performed
following the bolus administration of intravenous contrast.
Contrast: 80 ml Imnipaque-8YY

[Series 2: max st axial · axial · 0.33mm/px · z∈[+1149,+1229]mm · 8 of 48 slices shown, 10 images]
[im 4/48  brain]
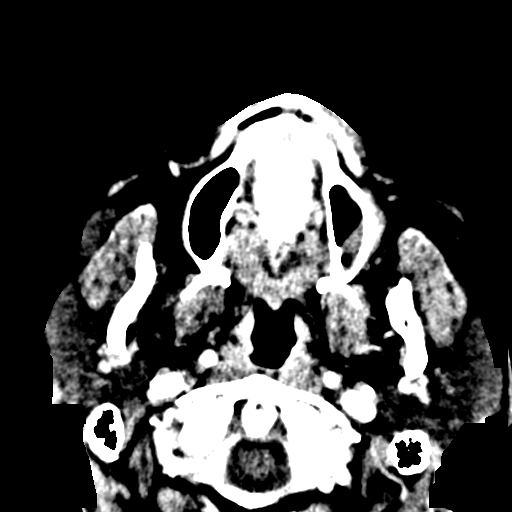
[im 4/48  bone]
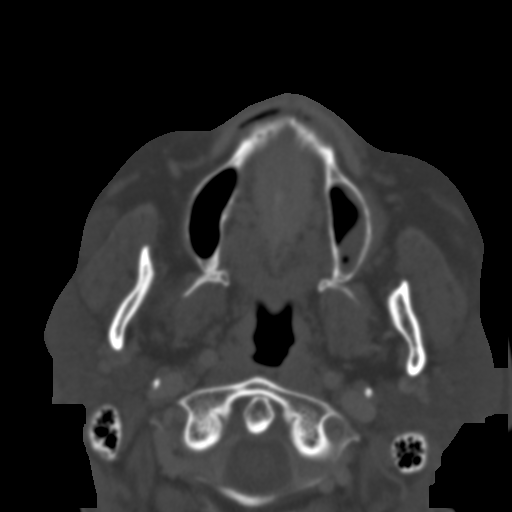
[im 10/48  bone]
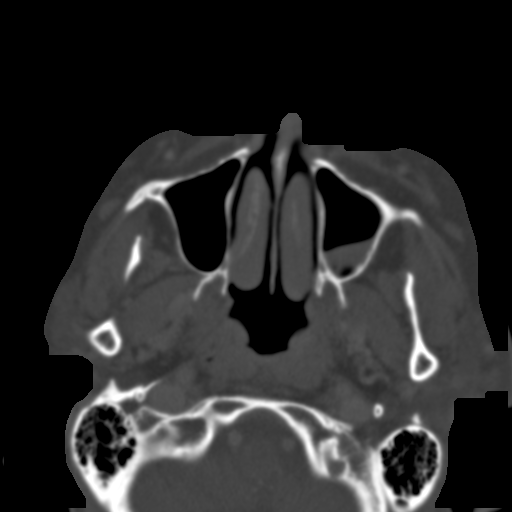
[im 15/48  bone]
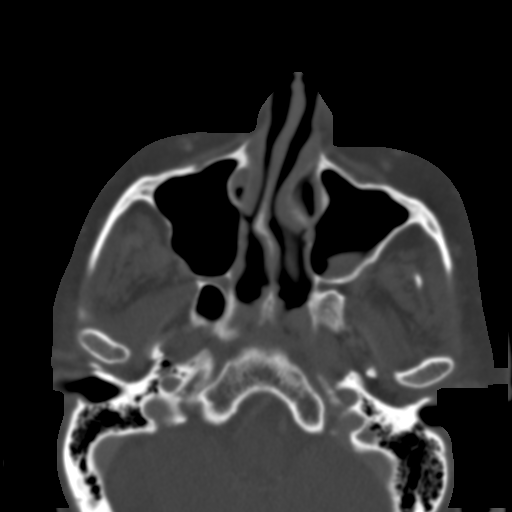
[im 22/48  bone]
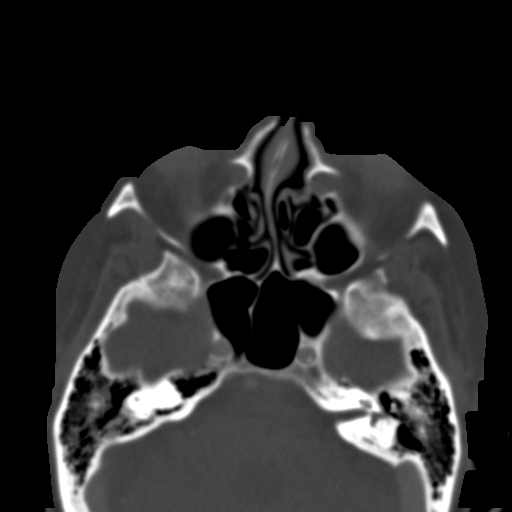
[im 26/48  brain]
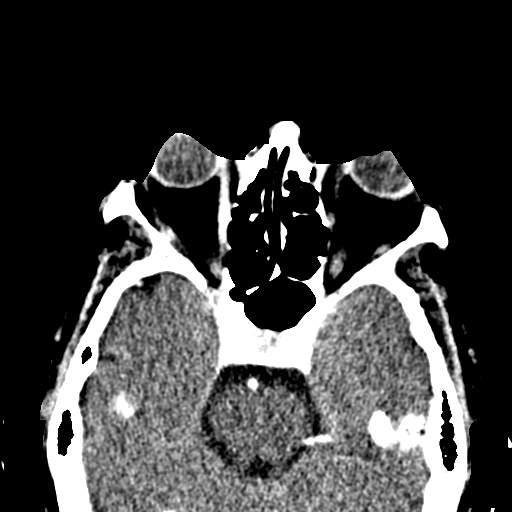
[im 26/48  bone]
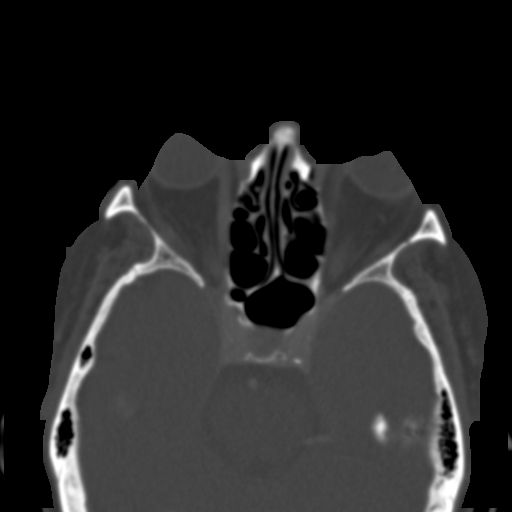
[im 33/48  bone]
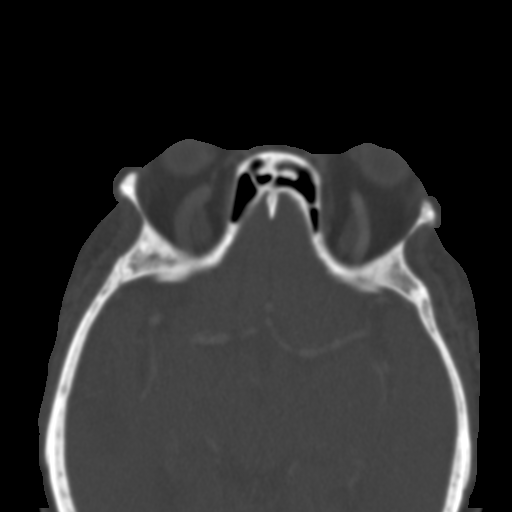
[im 38/48  bone]
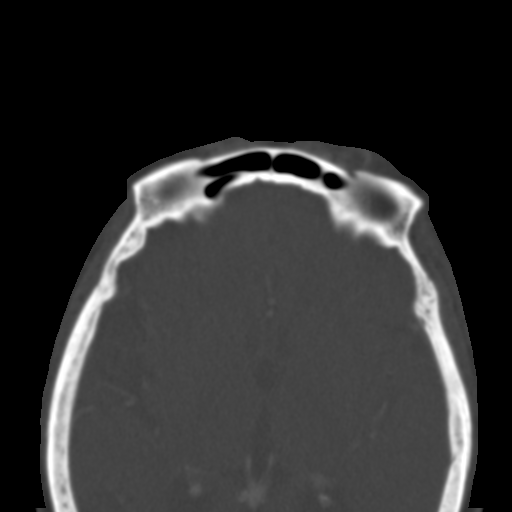
[im 44/48  bone]
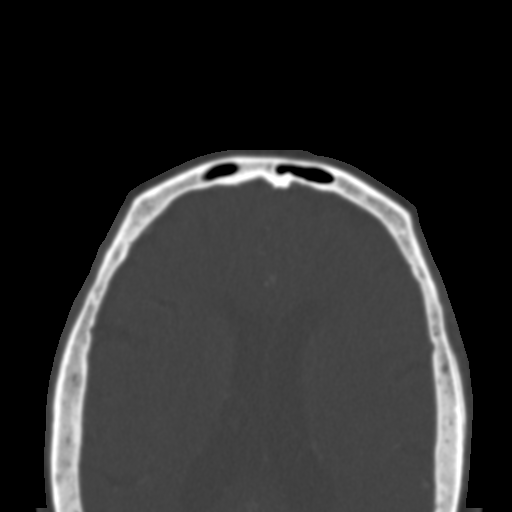

[Series 6: max bone coro · coronal · 0.23mm/px · 3 of 98 slices shown]
[im 20/98  bone]
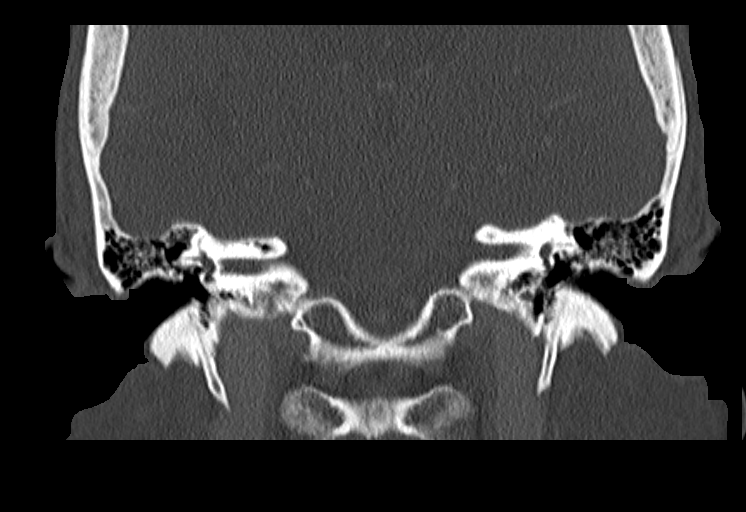
[im 39/98  bone]
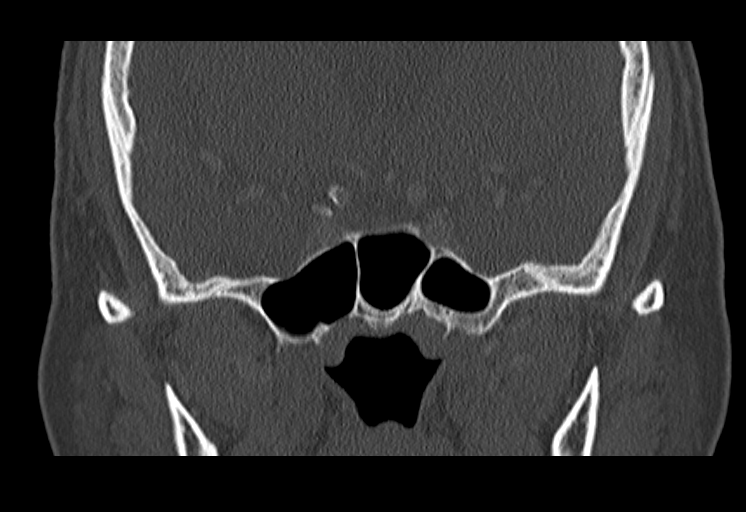
[im 59/98  bone]
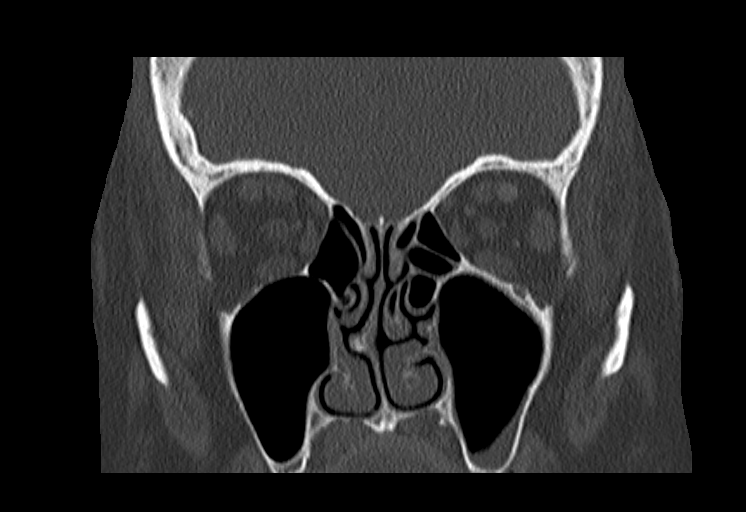

[Series 7: max bone sag · sagittal · 0.20mm/px · 2 of 108 slices shown]
[im 36/108  bone]
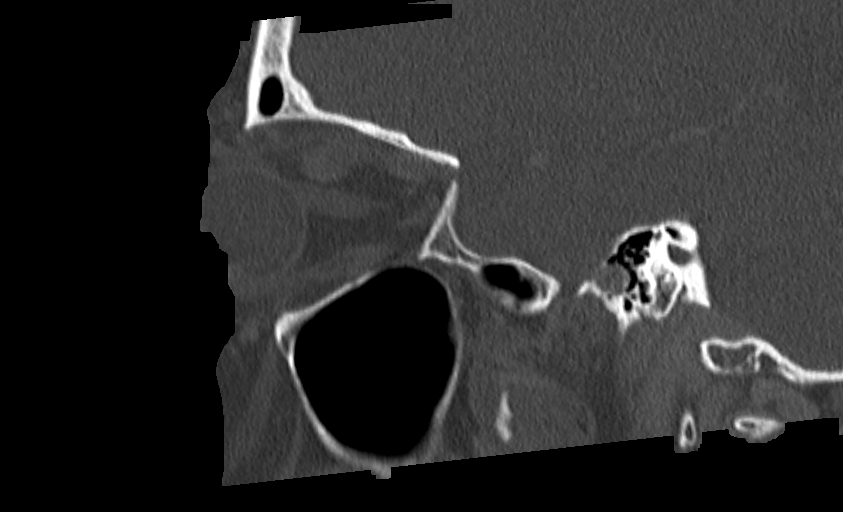
[im 72/108  bone]
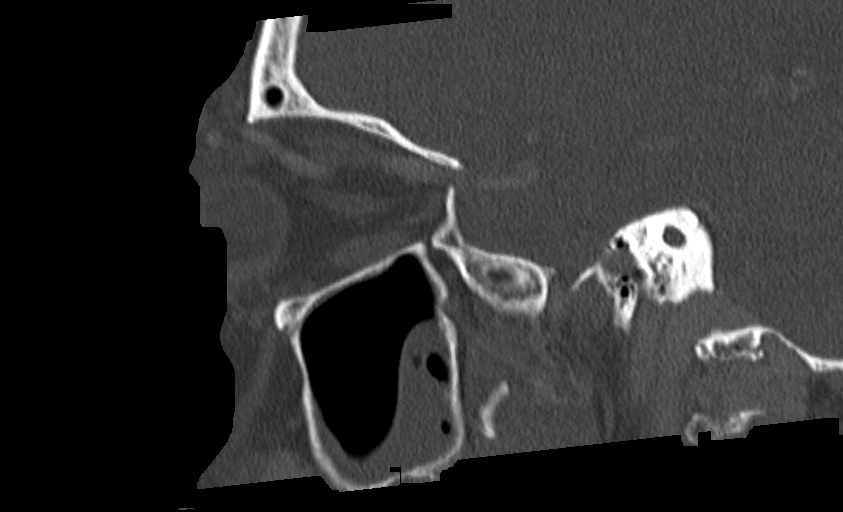

[13 of 47 positions shown; findings below may reference images not displayed]

FINDINGS: There is no osseous abnormality.  There is mild
inflammatory change of the left maxillary sinus.  The other
paranasal sinuses are clear.

The globes themselves appear normal.  Both orbit show prominent
veins in the forehead region draining through the superior
ophthalmic veins.  No sign of thrombosis.  No cavernous sinus
pathology evident.

There is a low-density abnormality superior to the globe on the
left, centered in the midline and measuring approximately 2 x 1.3 x
0.7 cm.  This does not appear to have an epicenter in the lacrimal
gland but could arise from the margin of the lacrimal gland.  The
differential diagnosis includes dermoid or epidermoid, benign cyst
of the eye lid, capillary hemangioma and lymphangioma.  This could
possibly arise from the margin of the lacrimal gland and be a
benign or malignant neoplasm.  Extraocular muscles appear normal.
IMPRESSION: 2 x 1.3 x 0.7 cm low density entity superior to the globe,
superficially on the left. Differential diagnosis includes benign
cyst of the eye lid, epidermoid or dermoid cyst, lymphangioma or
hemangioma, or atypical lacrimal lesion.

Prominent superior ophthalmic veins bilaterally without appreciable
cause.  Cavernous sinuses appear within normal limits.  I do not
see any imaging evidence of carotid cavernous fistula based on this
exam.

## 2010-12-05 LAB — RETICULOCYTES
RBC.: 3.41 MIL/uL — ABNORMAL LOW (ref 3.87–5.11)
Retic Count, Absolute: 54.6 10*3/uL (ref 19.0–186.0)
Retic Ct Pct: 1.6 % (ref 0.4–3.1)

## 2010-12-05 LAB — CBC
HCT: 30.8 % — ABNORMAL LOW (ref 36.0–46.0)
Hemoglobin: 9.9 g/dL — ABNORMAL LOW (ref 12.0–15.0)
MCH: 29 pg (ref 26.0–34.0)
MCHC: 32.1 g/dL (ref 30.0–36.0)
MCV: 90.3 fL (ref 78.0–100.0)
Platelets: 216 10*3/uL (ref 150–400)
RBC: 3.41 MIL/uL — ABNORMAL LOW (ref 3.87–5.11)
RDW: 15.4 % (ref 11.5–15.5)
WBC: 5 10*3/uL (ref 4.0–10.5)

## 2010-12-05 LAB — DIFFERENTIAL
Basophils Absolute: 0 10*3/uL (ref 0.0–0.1)
Basophils Relative: 0 % (ref 0–1)
Eosinophils Absolute: 0 10*3/uL (ref 0.0–0.7)
Eosinophils Relative: 0 % (ref 0–5)
Lymphocytes Relative: 20 % (ref 12–46)
Lymphs Abs: 1 10*3/uL (ref 0.7–4.0)
Monocytes Absolute: 0.2 10*3/uL (ref 0.1–1.0)
Monocytes Relative: 5 % (ref 3–12)
Neutro Abs: 3.7 10*3/uL (ref 1.7–7.7)
Neutrophils Relative %: 75 % (ref 43–77)

## 2010-12-05 LAB — IRON AND TIBC
Iron: 69 ug/dL (ref 42–135)
Saturation Ratios: 16 % — ABNORMAL LOW (ref 20–55)
TIBC: 431 ug/dL (ref 250–470)
UIBC: 362 ug/dL

## 2010-12-05 LAB — FERRITIN: Ferritin: 364 ng/mL — ABNORMAL HIGH (ref 10–291)

## 2010-12-05 LAB — VITAMIN B12: Vitamin B-12: 330 pg/mL (ref 211–911)

## 2010-12-05 LAB — FOLATE: Folate: >20 ng/mL

## 2010-12-05 LAB — HAPTOGLOBIN: Haptoglobin: <6 mg/dL — ABNORMAL LOW (ref 16–200)

## 2010-12-05 LAB — LACTATE DEHYDROGENASE: LDH: 224 U/L (ref 94–250)

## 2010-12-07 ENCOUNTER — Telehealth: Payer: Self-pay | Admitting: Family Medicine

## 2010-12-08 LAB — DIFFERENTIAL
Basophils Absolute: 0 10*3/uL (ref 0.0–0.1)
Basophils Absolute: 0 10*3/uL (ref 0.0–0.1)
Basophils Absolute: 0 10*3/uL (ref 0.0–0.1)
Basophils Absolute: 0 10*3/uL (ref 0.0–0.1)
Basophils Absolute: 0 10*3/uL (ref 0.0–0.1)
Basophils Relative: 0 % (ref 0–1)
Basophils Relative: 0 % (ref 0–1)
Basophils Relative: 0 % (ref 0–1)
Basophils Relative: 0 % (ref 0–1)
Basophils Relative: 0 % (ref 0–1)
Eosinophils Absolute: 0 10*3/uL (ref 0.0–0.7)
Eosinophils Absolute: 0 10*3/uL (ref 0.0–0.7)
Eosinophils Absolute: 0 10*3/uL (ref 0.0–0.7)
Eosinophils Absolute: 0 10*3/uL (ref 0.0–0.7)
Eosinophils Absolute: 0 10*3/uL (ref 0.0–0.7)
Eosinophils Relative: 0 % (ref 0–5)
Eosinophils Relative: 0 % (ref 0–5)
Eosinophils Relative: 0 % (ref 0–5)
Eosinophils Relative: 0 % (ref 0–5)
Eosinophils Relative: 0 % (ref 0–5)
Lymphocytes Relative: 15 % (ref 12–46)
Lymphocytes Relative: 16 % (ref 12–46)
Lymphocytes Relative: 17 % (ref 12–46)
Lymphocytes Relative: 23 % (ref 12–46)
Lymphocytes Relative: 18 % (ref 12–46)
Lymphs Abs: 0.8 10*3/uL (ref 0.7–4.0)
Lymphs Abs: 0.9 10*3/uL (ref 0.7–4.0)
Lymphs Abs: 1 10*3/uL (ref 0.7–4.0)
Lymphs Abs: 1.1 10*3/uL (ref 0.7–4.0)
Lymphs Abs: 1.2 10*3/uL (ref 0.7–4.0)
Monocytes Absolute: 0.5 10*3/uL (ref 0.1–1.0)
Monocytes Absolute: 0.5 10*3/uL (ref 0.1–1.0)
Monocytes Absolute: 0.6 10*3/uL (ref 0.1–1.0)
Monocytes Absolute: 0.7 10*3/uL (ref 0.1–1.0)
Monocytes Absolute: 0.7 10*3/uL (ref 0.1–1.0)
Monocytes Relative: 10 % (ref 3–12)
Monocytes Relative: 11 % (ref 3–12)
Monocytes Relative: 11 % (ref 3–12)
Monocytes Relative: 11 % (ref 3–12)
Monocytes Relative: 11 % (ref 3–12)
Neutro Abs: 3.4 10*3/uL (ref 1.7–7.7)
Neutro Abs: 3.6 10*3/uL (ref 1.7–7.7)
Neutro Abs: 4.5 10*3/uL (ref 1.7–7.7)
Neutro Abs: 4.8 10*3/uL (ref 1.7–7.7)
Neutro Abs: 4.7 10*3/uL (ref 1.7–7.7)
Neutrophils Relative %: 67 % (ref 43–77)
Neutrophils Relative %: 72 % (ref 43–77)
Neutrophils Relative %: 74 % (ref 43–77)
Neutrophils Relative %: 74 % (ref 43–77)
Neutrophils Relative %: 71 % (ref 43–77)

## 2010-12-08 LAB — PROTIME-INR
INR: 1.97 — ABNORMAL HIGH (ref 0.00–1.49)
INR: 2.34 — ABNORMAL HIGH (ref 0.00–1.49)
INR: 2.67 — ABNORMAL HIGH (ref 0.00–1.49)
INR: 2.73 — ABNORMAL HIGH (ref 0.00–1.49)
INR: 2.28 — ABNORMAL HIGH (ref 0.00–1.49)
Prothrombin Time: 22.6 seconds — ABNORMAL HIGH (ref 11.6–15.2)
Prothrombin Time: 25.8 seconds — ABNORMAL HIGH (ref 11.6–15.2)
Prothrombin Time: 28.5 seconds — ABNORMAL HIGH (ref 11.6–15.2)
Prothrombin Time: 29 seconds — ABNORMAL HIGH (ref 11.6–15.2)
Prothrombin Time: 25.3 seconds — ABNORMAL HIGH (ref 11.6–15.2)

## 2010-12-08 LAB — CBC
HCT: 26.1 % — ABNORMAL LOW (ref 36.0–46.0)
HCT: 26.3 % — ABNORMAL LOW (ref 36.0–46.0)
HCT: 27.7 % — ABNORMAL LOW (ref 36.0–46.0)
HCT: 27.8 % — ABNORMAL LOW (ref 36.0–46.0)
HCT: 28.6 % — ABNORMAL LOW (ref 36.0–46.0)
Hemoglobin: 8.9 g/dL — ABNORMAL LOW (ref 12.0–15.0)
Hemoglobin: 9 g/dL — ABNORMAL LOW (ref 12.0–15.0)
Hemoglobin: 9.4 g/dL — ABNORMAL LOW (ref 12.0–15.0)
Hemoglobin: 9.4 g/dL — ABNORMAL LOW (ref 12.0–15.0)
Hemoglobin: 9.6 g/dL — ABNORMAL LOW (ref 12.0–15.0)
MCH: 29.4 pg (ref 26.0–34.0)
MCH: 29.8 pg (ref 26.0–34.0)
MCH: 29.8 pg (ref 26.0–34.0)
MCH: 30.1 pg (ref 26.0–34.0)
MCH: 29.3 pg (ref 26.0–34.0)
MCHC: 33.8 g/dL (ref 30.0–36.0)
MCHC: 33.9 g/dL (ref 30.0–36.0)
MCHC: 34 g/dL (ref 30.0–36.0)
MCHC: 34.4 g/dL (ref 30.0–36.0)
MCHC: 33.6 g/dL (ref 30.0–36.0)
MCV: 86.9 fL (ref 78.0–100.0)
MCV: 87.7 fL (ref 78.0–100.0)
MCV: 87.7 fL (ref 78.0–100.0)
MCV: 88 fL (ref 78.0–100.0)
MCV: 87.2 fL (ref 78.0–100.0)
Platelets: 198 10*3/uL (ref 150–400)
Platelets: 210 10*3/uL (ref 150–400)
Platelets: 213 10*3/uL (ref 150–400)
Platelets: 215 10*3/uL (ref 150–400)
Platelets: 258 10*3/uL (ref 150–400)
RBC: 2.97 MIL/uL — ABNORMAL LOW (ref 3.87–5.11)
RBC: 3 MIL/uL — ABNORMAL LOW (ref 3.87–5.11)
RBC: 3.15 MIL/uL — ABNORMAL LOW (ref 3.87–5.11)
RBC: 3.2 MIL/uL — ABNORMAL LOW (ref 3.87–5.11)
RBC: 3.28 MIL/uL — ABNORMAL LOW (ref 3.87–5.11)
RDW: 14.9 % (ref 11.5–15.5)
RDW: 15.3 % (ref 11.5–15.5)
RDW: 15.3 % (ref 11.5–15.5)
RDW: 15.4 % (ref 11.5–15.5)
RDW: 15.5 % (ref 11.5–15.5)
WBC: 5 10*3/uL (ref 4.0–10.5)
WBC: 5.1 10*3/uL (ref 4.0–10.5)
WBC: 6.1 10*3/uL (ref 4.0–10.5)
WBC: 6.4 10*3/uL (ref 4.0–10.5)
WBC: 6.5 10*3/uL (ref 4.0–10.5)

## 2010-12-08 LAB — BASIC METABOLIC PANEL
BUN: 25 mg/dL — ABNORMAL HIGH (ref 6–23)
BUN: 27 mg/dL — ABNORMAL HIGH (ref 6–23)
BUN: 39 mg/dL — ABNORMAL HIGH (ref 6–23)
CO2: 27 mEq/L (ref 19–32)
CO2: 28 mEq/L (ref 19–32)
CO2: 29 mEq/L (ref 19–32)
Calcium: 9.2 mg/dL (ref 8.4–10.5)
Calcium: 9.4 mg/dL (ref 8.4–10.5)
Calcium: 9.9 mg/dL (ref 8.4–10.5)
Chloride: 100 mEq/L (ref 96–112)
Chloride: 97 mEq/L (ref 96–112)
Chloride: 98 mEq/L (ref 96–112)
Creatinine, Ser: 1.66 mg/dL — ABNORMAL HIGH (ref 0.4–1.2)
Creatinine, Ser: 1.73 mg/dL — ABNORMAL HIGH (ref 0.4–1.2)
Creatinine, Ser: 2.18 mg/dL — ABNORMAL HIGH (ref 0.4–1.2)
GFR calc Af Amer: 27 mL/min — ABNORMAL LOW (ref >60)
GFR calc Af Amer: 35 mL/min — ABNORMAL LOW (ref >60)
GFR calc Af Amer: 36 mL/min — ABNORMAL LOW (ref >60)
GFR calc non Af Amer: 22 mL/min — ABNORMAL LOW (ref >60)
GFR calc non Af Amer: 29 mL/min — ABNORMAL LOW (ref >60)
GFR calc non Af Amer: 30 mL/min — ABNORMAL LOW (ref >60)
Glucose, Bld: 117 mg/dL — ABNORMAL HIGH (ref 70–99)
Glucose, Bld: 97 mg/dL (ref 70–99)
Glucose, Bld: 97 mg/dL (ref 70–99)
Potassium: 4.3 mEq/L (ref 3.5–5.1)
Potassium: 4.4 mEq/L (ref 3.5–5.1)
Potassium: 4.6 mEq/L (ref 3.5–5.1)
Sodium: 131 mEq/L — ABNORMAL LOW (ref 135–145)
Sodium: 132 mEq/L — ABNORMAL LOW (ref 135–145)
Sodium: 132 mEq/L — ABNORMAL LOW (ref 135–145)

## 2010-12-08 LAB — LIPASE, BLOOD
Lipase: 36 U/L (ref 11–59)
Lipase: 41 U/L (ref 11–59)
Lipase: 49 U/L (ref 11–59)
Lipase: 68 U/L — ABNORMAL HIGH (ref 11–59)

## 2010-12-08 LAB — GLUCOSE, CAPILLARY
Glucose-Capillary: 102 mg/dL — ABNORMAL HIGH (ref 70–99)
Glucose-Capillary: 104 mg/dL — ABNORMAL HIGH (ref 70–99)
Glucose-Capillary: 106 mg/dL — ABNORMAL HIGH (ref 70–99)
Glucose-Capillary: 114 mg/dL — ABNORMAL HIGH (ref 70–99)
Glucose-Capillary: 114 mg/dL — ABNORMAL HIGH (ref 70–99)
Glucose-Capillary: 144 mg/dL — ABNORMAL HIGH (ref 70–99)
Glucose-Capillary: 151 mg/dL — ABNORMAL HIGH (ref 70–99)
Glucose-Capillary: 73 mg/dL (ref 70–99)
Glucose-Capillary: 76 mg/dL (ref 70–99)
Glucose-Capillary: 82 mg/dL (ref 70–99)
Glucose-Capillary: 90 mg/dL (ref 70–99)
Glucose-Capillary: 91 mg/dL (ref 70–99)
Glucose-Capillary: 98 mg/dL (ref 70–99)
Glucose-Capillary: 121 mg/dL — ABNORMAL HIGH (ref 70–99)
Glucose-Capillary: 126 mg/dL — ABNORMAL HIGH (ref 70–99)
Glucose-Capillary: 145 mg/dL — ABNORMAL HIGH (ref 70–99)

## 2010-12-08 LAB — IRON AND TIBC
Iron: 72 ug/dL (ref 42–135)
Saturation Ratios: 19 % — ABNORMAL LOW (ref 20–55)
TIBC: 373 ug/dL (ref 250–470)
UIBC: 301 ug/dL

## 2010-12-08 LAB — COMPREHENSIVE METABOLIC PANEL
ALT: 11 U/L (ref 0–35)
ALT: 11 U/L (ref 0–35)
AST: 22 U/L (ref 0–37)
AST: 23 U/L (ref 0–37)
Albumin: 3.4 g/dL — ABNORMAL LOW (ref 3.5–5.2)
Albumin: 3.7 g/dL (ref 3.5–5.2)
Alkaline Phosphatase: 22 U/L — ABNORMAL LOW (ref 39–117)
Alkaline Phosphatase: 24 U/L — ABNORMAL LOW (ref 39–117)
BUN: 22 mg/dL (ref 6–23)
BUN: 43 mg/dL — ABNORMAL HIGH (ref 6–23)
CO2: 29 mEq/L (ref 19–32)
CO2: 30 mEq/L (ref 19–32)
Calcium: 9.1 mg/dL (ref 8.4–10.5)
Calcium: 9.8 mg/dL (ref 8.4–10.5)
Chloride: 96 mEq/L (ref 96–112)
Chloride: 97 mEq/L (ref 96–112)
Creatinine, Ser: 1.64 mg/dL — ABNORMAL HIGH (ref 0.4–1.2)
Creatinine, Ser: 2.82 mg/dL — ABNORMAL HIGH (ref 0.4–1.2)
GFR calc Af Amer: 37 mL/min — ABNORMAL LOW (ref >60)
GFR calc Af Amer: 20 mL/min — ABNORMAL LOW (ref >60)
GFR calc non Af Amer: 30 mL/min — ABNORMAL LOW (ref >60)
GFR calc non Af Amer: 16 mL/min — ABNORMAL LOW (ref >60)
Glucose, Bld: 76 mg/dL (ref 70–99)
Glucose, Bld: 125 mg/dL — ABNORMAL HIGH (ref 70–99)
Potassium: 4.3 mEq/L (ref 3.5–5.1)
Potassium: 4.6 mEq/L (ref 3.5–5.1)
Sodium: 130 mEq/L — ABNORMAL LOW (ref 135–145)
Sodium: 132 mEq/L — ABNORMAL LOW (ref 135–145)
Total Bilirubin: 0.3 mg/dL (ref 0.3–1.2)
Total Bilirubin: 0.4 mg/dL (ref 0.3–1.2)
Total Protein: 7 g/dL (ref 6.0–8.3)
Total Protein: 7.8 g/dL (ref 6.0–8.3)

## 2010-12-08 LAB — URINALYSIS, ROUTINE W REFLEX MICROSCOPIC
Bilirubin Urine: NEGATIVE
Glucose, UA: NEGATIVE mg/dL
Hgb urine dipstick: NEGATIVE
Ketones, ur: NEGATIVE mg/dL
Nitrite: NEGATIVE
Protein, ur: NEGATIVE mg/dL
Specific Gravity, Urine: 1.02 (ref 1.005–1.030)
Urobilinogen, UA: 0.2 mg/dL (ref 0.0–1.0)
pH: 6 (ref 5.0–8.0)

## 2010-12-08 LAB — CK: Total CK: 93 U/L (ref 7–177)

## 2010-12-08 LAB — CREATININE, URINE, RANDOM: Creatinine, Urine: 126.91 mg/dL

## 2010-12-08 LAB — BRAIN NATRIURETIC PEPTIDE
Pro B Natriuretic peptide (BNP): 135 pg/mL — ABNORMAL HIGH (ref 0.0–100.0)
Pro B Natriuretic peptide (BNP): 213 pg/mL — ABNORMAL HIGH (ref 0.0–100.0)
Pro B Natriuretic peptide (BNP): 202 pg/mL — ABNORMAL HIGH (ref 0.0–100.0)

## 2010-12-08 LAB — URINE CULTURE
Colony Count: NO GROWTH
Culture  Setup Time: 201109141920
Culture: NO GROWTH

## 2010-12-08 LAB — PROTEIN / CREATININE RATIO, URINE
Creatinine, Urine: 124.22 mg/dL
Protein Creatinine Ratio: 0.08 (ref 0.00–0.15)
Total Protein, Urine: 10 mg/dL

## 2010-12-08 LAB — RETICULOCYTES
RBC.: 3 MIL/uL — ABNORMAL LOW (ref 3.87–5.11)
Retic Count, Absolute: 72 10*3/uL (ref 19.0–186.0)
Retic Ct Pct: 2.4 % (ref 0.4–3.1)

## 2010-12-08 LAB — AMYLASE
Amylase: 108 U/L — ABNORMAL HIGH (ref 0–105)
Amylase: 116 U/L — ABNORMAL HIGH (ref 0–105)
Amylase: 141 U/L — ABNORMAL HIGH (ref 0–105)
Amylase: 95 U/L (ref 0–105)

## 2010-12-08 LAB — PHOSPHORUS: Phosphorus: 3.2 mg/dL (ref 2.3–4.6)

## 2010-12-08 LAB — TSH: TSH: 1.861 u[IU]/mL (ref 0.350–4.500)

## 2010-12-08 LAB — NA AND K (SODIUM & POTASSIUM), RAND UR
Potassium Urine: 69 mEq/L
Sodium, Ur: 21 mEq/L

## 2010-12-08 LAB — FERRITIN: Ferritin: 451 ng/mL — ABNORMAL HIGH (ref 10–291)

## 2010-12-08 LAB — PTH, INTACT AND CALCIUM
Calcium, Total (PTH): 9.5 mg/dL (ref 8.4–10.5)
PTH: 40.7 pg/mL (ref 14.0–72.0)

## 2010-12-08 LAB — LACTATE DEHYDROGENASE: LDH: 224 U/L (ref 94–250)

## 2010-12-08 LAB — VITAMIN B12: Vitamin B-12: 256 pg/mL (ref 211–911)

## 2010-12-08 LAB — FOLATE: Folate: >20 ng/mL

## 2010-12-08 LAB — DIGOXIN LEVEL: Digoxin Level: 0.6 ng/mL — ABNORMAL LOW (ref 0.8–2.0)

## 2010-12-11 LAB — CBC
HCT: 32.7 % — ABNORMAL LOW (ref 36.0–46.0)
Hemoglobin: 10.9 g/dL — ABNORMAL LOW (ref 12.0–15.0)
MCH: 29.5 pg (ref 26.0–34.0)
MCHC: 33.3 g/dL (ref 30.0–36.0)
MCV: 88.8 fL (ref 78.0–100.0)
Platelets: 235 10*3/uL (ref 150–400)
RBC: 3.68 MIL/uL — ABNORMAL LOW (ref 3.87–5.11)
RDW: 15.6 % — ABNORMAL HIGH (ref 11.5–15.5)
WBC: 7 10*3/uL (ref 4.0–10.5)

## 2010-12-11 LAB — RETICULOCYTES
RBC.: 3.68 MIL/uL — ABNORMAL LOW (ref 3.87–5.11)
Retic Count, Absolute: 81 10*3/uL (ref 19.0–186.0)
Retic Ct Pct: 2.2 % (ref 0.4–3.1)

## 2010-12-13 NOTE — Progress Notes (Signed)
Summary: medicine  Phone Note Call from Patient   Summary of Call: pt needs a refill on advair [facility] [phone] Initial call taken by: Lenn Cal,  December 07, 2010 1:17 PM    Prescriptions: ADVAIR DISKUS 100-50 MCG/DOSE MISC (FLUTICASONE-SALMETEROL) Inhale one puff by mouth two times a day  #1 x 1   Entered by:   Baldomero Lamy LPN   Authorized by:   [name] MD   Signed by:   Baldomero Lamy LPN on [id number]   Method used:   Electronically to        [facility] (retail)       [address]       [place], [place]  [address]       Ph: [phone]       Fax: [phone]   RxID:   [id number]

## 2010-12-28 LAB — DIFFERENTIAL
Basophils Absolute: 0 10*3/uL (ref 0.0–0.1)
Basophils Relative: 0 % (ref 0–1)
Eosinophils Absolute: 0 10*3/uL (ref 0.0–0.7)
Eosinophils Relative: 0 % (ref 0–5)
Lymphocytes Relative: 13 % (ref 12–46)
Lymphs Abs: 0.9 10*3/uL (ref 0.7–4.0)
Monocytes Absolute: 0.4 10*3/uL (ref 0.1–1.0)
Monocytes Relative: 6 % (ref 3–12)
Neutro Abs: 5.9 10*3/uL (ref 1.7–7.7)
Neutrophils Relative %: 81 % — ABNORMAL HIGH (ref 43–77)

## 2010-12-28 LAB — CBC
HCT: 30.2 % — ABNORMAL LOW (ref 36.0–46.0)
Hemoglobin: 10.3 g/dL — ABNORMAL LOW (ref 12.0–15.0)
MCHC: 34.2 g/dL (ref 30.0–36.0)
MCV: 86 fL (ref 78.0–100.0)
Platelets: 220 10*3/uL (ref 150–400)
RBC: 3.51 MIL/uL — ABNORMAL LOW (ref 3.87–5.11)
RDW: 15.2 % (ref 11.5–15.5)
WBC: 7.3 10*3/uL (ref 4.0–10.5)

## 2010-12-28 LAB — IRON AND TIBC
Iron: 53 ug/dL (ref 42–135)
Saturation Ratios: 14 % — ABNORMAL LOW (ref 20–55)
TIBC: 376 ug/dL (ref 250–470)
UIBC: 323 ug/dL

## 2010-12-28 LAB — RETICULOCYTES
RBC.: 3.51 MIL/uL — ABNORMAL LOW (ref 3.87–5.11)
Retic Count, Absolute: 66.7 10*3/uL (ref 19.0–186.0)
Retic Ct Pct: 1.9 % (ref 0.4–3.1)

## 2010-12-28 LAB — FERRITIN: Ferritin: 409 ng/mL — ABNORMAL HIGH (ref 10–291)

## 2010-12-28 LAB — VITAMIN B12: Vitamin B-12: 448 pg/mL (ref 211–911)

## 2010-12-28 LAB — FOLATE: Folate: >20 ng/mL

## 2011-01-05 ENCOUNTER — Telehealth: Payer: Self-pay | Admitting: Family Medicine

## 2011-01-05 NOTE — Telephone Encounter (Signed)
Called patient and she said that she can't remember which one she uses right now but will call back and let us know the name so we can fill out the right form.

## 2011-01-09 ENCOUNTER — Telehealth: Payer: Self-pay | Admitting: Family Medicine

## 2011-01-09 NOTE — Telephone Encounter (Signed)
noted 

## 2011-01-10 ENCOUNTER — Other Ambulatory Visit: Payer: Self-pay | Admitting: Family Medicine

## 2011-01-10 DIAGNOSIS — Z09 Encounter for follow-up examination after completed treatment for conditions other than malignant neoplasm: Secondary | ICD-10-CM

## 2011-01-10 LAB — RETICULOCYTES
RBC.: 3.53 MIL/uL — ABNORMAL LOW (ref 3.87–5.11)
Retic Count, Absolute: 67.1 10*3/uL (ref 19.0–186.0)
Retic Ct Pct: 1.9 % (ref 0.4–3.1)

## 2011-01-10 LAB — DIFFERENTIAL
Basophils Absolute: 0 10*3/uL (ref 0.0–0.1)
Basophils Relative: 0 % (ref 0–1)
Eosinophils Absolute: 0 10*3/uL (ref 0.0–0.7)
Eosinophils Relative: 0 % (ref 0–5)
Lymphocytes Relative: 10 % — ABNORMAL LOW (ref 12–46)
Lymphs Abs: 0.8 10*3/uL (ref 0.7–4.0)
Monocytes Absolute: 0.6 10*3/uL (ref 0.1–1.0)
Monocytes Relative: 8 % (ref 3–12)
Neutro Abs: 6.2 10*3/uL (ref 1.7–7.7)
Neutrophils Relative %: 82 % — ABNORMAL HIGH (ref 43–77)

## 2011-01-10 LAB — CBC
HCT: 30.2 % — ABNORMAL LOW (ref 36.0–46.0)
Hemoglobin: 10.3 g/dL — ABNORMAL LOW (ref 12.0–15.0)
MCHC: 34 g/dL (ref 30.0–36.0)
MCV: 85.8 fL (ref 78.0–100.0)
Platelets: 226 10*3/uL (ref 150–400)
RBC: 3.53 MIL/uL — ABNORMAL LOW (ref 3.87–5.11)
RDW: 16.4 % — ABNORMAL HIGH (ref 11.5–15.5)
WBC: 7.6 10*3/uL (ref 4.0–10.5)

## 2011-01-10 LAB — HAPTOGLOBIN: Haptoglobin: <6 mg/dL — ABNORMAL LOW (ref 16–200)

## 2011-02-01 ENCOUNTER — Other Ambulatory Visit: Payer: Self-pay | Admitting: Family Medicine

## 2011-02-01 LAB — BASIC METABOLIC PANEL
BUN: 40 mg/dL — ABNORMAL HIGH (ref 6–23)
CO2: 28 mEq/L (ref 19–32)
Calcium: 10 mg/dL (ref 8.4–10.5)
Chloride: 100 mEq/L (ref 96–112)
Creat: 1.93 mg/dL — ABNORMAL HIGH (ref 0.40–1.20)
Glucose, Bld: 109 mg/dL — ABNORMAL HIGH (ref 70–99)
Potassium: 4.6 mEq/L (ref 3.5–5.3)
Sodium: 139 mEq/L (ref 135–145)

## 2011-02-01 LAB — LIPID PANEL
Cholesterol: 104 mg/dL (ref 0–200)
HDL: 30 mg/dL — ABNORMAL LOW (ref >39)
LDL Cholesterol: 47 mg/dL (ref 0–99)
Total CHOL/HDL Ratio: 3.5 Ratio
Triglycerides: 136 mg/dL (ref <150)
VLDL: 27 mg/dL (ref 0–40)

## 2011-02-01 LAB — HEMOGLOBIN A1C
Hgb A1c MFr Bld: 6.5 % — ABNORMAL HIGH (ref <5.7)
Mean Plasma Glucose: 140 mg/dL — ABNORMAL HIGH (ref <117)

## 2011-02-01 LAB — TSH: TSH: 1.847 u[IU]/mL (ref 0.350–4.500)

## 2011-02-06 ENCOUNTER — Encounter (INDEPENDENT_AMBULATORY_CARE_PROVIDER_SITE_OTHER): Payer: Self-pay | Admitting: Surgery

## 2011-02-06 ENCOUNTER — Encounter: Payer: Self-pay | Admitting: Family Medicine

## 2011-02-07 NOTE — Assessment & Plan Note (Signed)
OFFICE VISIT   [name], [name]  DOB:  18-Apr-1932                                       03/10/2009  [id number]   [name] [name] is a 77 year old female who I am seeing today for concern  regarding venous pathology in her right leg.  I had seen her back in  January 2008 for similar concerns.  Her main concern continues to be  swelling in her knee.  She reports that she has had cortisone injections  with some relief for this in the past.  She does have a reticular  varicosity over the lateral aspect of her right knee, and this does  tract up to her posterior thigh.  She does not have any tenderness over  this and does not have any specific pain over this.  She does have some  knee discomfort specifically.  She does not have a history of deep  venous thrombosis or superficial thrombophlebitis or bleeding from her  veins.  She does have history of diabetes mellitus, non-insulin-  dependent; hyperlipidemia; hypothyroidism; hypertension; and history of  neck spasms.   PHYSICAL EXAMINATION:  Blood pressure is 127/69, heart rate 87,  respirations 18.  Her radial and dorsalis pedis pulses are 2+  bilaterally.  She does not have any swelling and denies any swelling  other than her knee.  She does have reticular veins over the lateral  aspect of her right knee.   She underwent duplex which showed normal size great saphenous vein in  her right thigh and no evidence of reflux.  I reassured [name] that  I do not feel like her knee symptoms are at all related to venous  pathology and suspect this is primarily arthritic difficulty in her knee  itself.  I do not see any evidence of any dangerous venous pathology.  She was reassured with this discussion and will see [place] again on an as  needed basis.   Rosetta Posner, M.D.  Electronically Signed   TFE/MEDQ  D:  03/10/2009  T:  03/11/2009  Job:  [id number]   cc:   [name] [name], M.D.

## 2011-02-07 NOTE — Op Note (Signed)
NAME:  [name] [name], [name] [name]              ACCOUNT NO.:  [id number]   MEDICAL RECORD NO.:  [id number]          PATIENT TYPE:  INP   LOCATION:  A227                          FACILITY:  [facility]   PHYSICIAN:  R. Garfield Cornea, M.D. DATE OF BIRTH:  01-11-1932   DATE OF PROCEDURE:  06/12/2007  DATE OF DISCHARGE:                               OPERATIVE REPORT   PROCEDURE:  Diagnostic EGD followed by colonoscopy.   INDICATIONS FOR PROCEDURE:  A 77 year old African-American female with a  history of iron deficiency and anemia and history of colonic adenomatous  polyps, now admitted to the hospital for heparin bridging for endoscopic  evaluation.   Last colonoscopy was 2004 in [place].  She also has had some vague  upper abdominal pain relief with AcipHex recently EGD and colonoscopy  are now being done.  As stated above this lady's Coumadin was stopped  over this past week and she has been bridged with heparin.  That was  stopped approximately 5 hours ago.  She has been given vancomycin and  gentamicin for SBE prophylaxis and Dr. Mathis Bud has assisted on the  case.  This approach has discussed with Ms. Studer at length.  Potential risks, benefits and alternatives have been reviewed, questions  answered.  She is agreeable.  The defibrillator rep has come and  inactivated the defibrillator but the pacer has been left functional as  I am told she has the pacer because of complete heart block.   PROCEDURE NOTE:  O2 saturation, blood pressure, pulse and respirations  were monitor the entire procedure.   CONSCIOUS SEDATION:  Versed 2 mg IV, Demerol 50 mg. SBE prophylaxis IV  vancomycin, gentamicin. Cetacaine spray for topical pharyngeal  anesthesia.   INSTRUMENT:  Pentax video chip system.   FINDINGS:  EGD examination of tubular esophagus revealed normal-  appearing mucosa.  EG junction easily traversed.  Stomach:  Gastric cavity was empty.  It insufflated well with air.  Thorough examination of  the gastric mucosa including retroflex view of  proximal stomach, esophagogastric junction demonstrated only a small  hiatal hernia.  Pylorus patent, easily traversed.  Examination bulb,  second portion revealed no abnormalities.   THERAPEUTIC/DIAGNOSTIC MANEUVERS PERFORMED:  None.   The patient tolerated procedure, was prepared for colonoscopy.  Digital  rectal exam revealed no abnormalities.   ENDOSCOPIC FINDINGS:  The prep was excellent.  Examination of colonic  mucosa was undertaken from the rectosigmoid junction through the left,  transverse right colon to area of appendiceal orifice, ileocecal valve  and cecum.  These structures were well seen photographed for the record.  From this level scope slowly withdrawn.  All previously mentioned  mucosal surfaces were again seen.  The colonic mucosa appeared entirely  normal.  Scope was pulled down the rectum.  Thorough examination rectal  mucosa including retroflex view of anal verge demonstrated no  abnormalities.  The patient tolerated the procedure well, was reacted in  endoscopy.   IMPRESSION:  Esophagogastroduodenoscopy normal esophagus, small hiatal  hernia, otherwise normal stomach, D1, D2.   Colonoscopy findings:  1. Normal rectum.  2. Normal colon.  RECOMMENDATIONS:  1. We will continue this nice lady on a proton pump inhibitor      empirically.  If she has any further upper GI tract symptoms      further evaluation warranted.  2. Will restart heparin drip immediately.  We will give her a dose of      Coumadin 15 mg this evening.  Further dosing per Dr. Mathis Bud      tomorrow (discussed with Dr. Mathis Bud).  3. 4 grams carbohydrate modified diet.  4. Will check a pro time on June 14, 2007.  5. Repeat surveillance colonoscopy in 5 years.  6. Defibrillator rep to reactivate defibrillator per protocol (the      patient is to remain on heart monitor until defibrillator is      reactivated).  7. Anticipate discharge  from the hospital in 2-3 days.      Bridgette Habermann, M.D.  Electronically Signed     RMR/MEDQ  D:  06/12/2007  T:  06/13/2007  Job:  [id number]   cc:   Leslye Peer, MD  Fax: [place]. [name], M.D.  Fax: [phone]

## 2011-02-07 NOTE — Discharge Summary (Signed)
NAME:  [name] [name], [name] [name]              ACCOUNT NO.:  [id number]   MEDICAL RECORD NO.:  [id number]          PATIENT TYPE:  INP   LOCATION:  A227                          FACILITY:  [facility]   PHYSICIAN:  R. Garfield Cornea, M.D. DATE OF BIRTH:  1932/08/16   DATE OF ADMISSION:  06/10/2007  DATE OF DISCHARGE:  09/21/2008LH                               DISCHARGE SUMMARY   DISCHARGE DIAGNOSES:  1. Hospitalization solely for heparin bridging for EGD and colonoscopy      performed June 12, 2007.  2. History of adenomatous polyps/history of dyspepsia responsive to      acid suppression therapy.  3. Minimally elevated creatinine on the day of discharge.   SECONDARY DIAGNOSES:  1. History of mixed iron deficiency and anemia of chronic disease.  2. Type 2 diabetes mellitus.  3. Hypothyroidism, hyperlipidemia.  4. Cardiomyopathy with aortic and mitral valve replacement.  5. Status post defibrillator/pacer placement, history of complete      heart block, pacemaker dependent according to Dr. Mathis Bud.   PROCEDURES PERFORMED:  1. EGD and colonoscopy, June 12, 2007.  2. Cardiology consultation June 10, 2007, Dr. Terance Ice.   DISCHARGE DIET:  A 2000 calorie, 4 g sodium, ADA.   DISCHARGE ACTIVITY:  As tolerated.   DISCHARGE FOLLOWUP:  1. [name] is instructed to followup with Dr. Mathis Bud in      her office on June 17, 2007, for INR and BMET.  2. She has been instructed to call and make an appointment to see Dr.      [name] in 2-3 weeks.   DISCHARGE MEDICATIONS:  1. Metoprolol 50 mg orally twice daily.  2. Coumadin 20 mg daily.  Patient is to take her evening dose at home      today.  3. Digoxin 0.125 mg daily.  4. Protonix 40 mg orally daily.  5. Lasix 40 mg orally daily.  6. Synthroid 75 mcg daily.  7. Potassium chloride 30 mEq daily.  8. Calcium 500 mg daily.  9. Os-Cal 1500 mg t.i.d.  10.Glipizide 5 mg once daily.  11.Vitamin C 500 mg daily.  12.Vytorin 10/20 one daily.  13.Hydrocodone 5/325 mg as needed.  14.Albuterol inhaler as needed.  15.Colace one tablet daily.  16.MiraLax 17 g orally daily p.r.n. basis.  17.BiDil -  Isosorbide/hydralazine 20/37.5 mg tablets one twice daily.   HOSPITAL COURSE:  [name] [name] is a very pleasant 77 year old lady  with a history of vague upper abdominal discomfort relieved with acid  suppression therapy (Aciphex) as an outpatient.  She has a history of  colonic adenomas with prior colonoscopies done in [place].  She has a  mixed picture of iron deficiency and anemia of chronic disease.  She is  admitted solely to the hospital so that time off anticoagulation can be  minimized given that she has two mechanical heart valves.  She was  admitted to the hospital.  Coumadin was previously stopped the day  before as an outpatient.  She was started on a heparin drip  (orchestrated by Pharmacy).  She was monitored.  She had no difficulties  with heparin as far as thrombocytopenia or antibodies are concerned.  Her INR dropped down to 1.5, which allowed an EGD and colonoscopy to be  performed.  On June 12, 2007, she was found to have a small hiatal  hernia otherwise and some pigment deposit in the duodenal bulb most  suggestive of recent iron therapy.  Otherwise, her upper GI tract  appeared normal.  Colonoscopy revealed both a normal rectum and colon in  the setting of an excellent prep.   The date of endoscopic evaluation she was started back on heparin and  Coumadin.  Her INR was monitored, on June 16, 2007, it was up to  2.6.  Both Dr. Mathis Bud and Dr. Rollene Fare saw this nice lady while she  was hospitalized and helped with her management.  It was notable she was  somewhat difficult to anticoagulate on Coumadin and per Dr. Martie Round  instructions she is currently on a dose of Coumadin 20 mg in the evening  and will be discharged on that dose.   Also of note during her  hospitalization her digoxin level was found to  be 1.6.  Dr. Mathis Bud decreased her Lanoxin from 0.25 mg daily to 1.25  mg daily.  Also, Dr. Rollene Fare changed her metoprolol from 100 mg once  daily to metoprolol 50 mg orally twice daily.   She was noted to have a stable anemia with a hemoglobin in the 9-10  range during hospitalization.  Her blood sugars were well controlled in  the 80-133 range.  Her hemoglobin and hematocrit on the day of discharge  is 9.5, 28.4 and platelet count 202,000 (her hemoglobin on June 13, 2007, was 9.6).  Her electrolytes on June 16, 2007:  Sodium 138,  potassium 4.6, chloride 102, bicarbonate 30.  BUN 18, creatinine  slightly elevated at 1.22, blood sugar 117, again INR 2.6.   On June 12, 2007, her BUN was 16 and her creatinine was 1.69.   Ms. Stranger admits to not drinking the usual fluids that she consumes  over the past several days while being hospitalized.   On the day of discharge this lady looked very good, as good as she did  on the day of admission, she was quite stable and has received maximal  benefit of her hospitalization.  She is discharged in stable condition.  I went over her medications in some detail with her and stressed the  importance of taking her Coumadin this evening and is seeing Dr.  Mathis Bud tomorrow for lab work as outlined above.   She was started on Protonix 40 mg orally daily during her  hospitalization and has not had any more abdominal pain.  We will  continue that regimen.   She will not need another colonoscopy for 5 years.   She did have some trivial low back pain while hospitalized which was  treated with p.r.n. Darvocet.  Please also note that her defibrillator  was inactivated by the representative of the defibrillator company for  the procedure and then it was reactivated immediately following the  procedure.      Bridgette Habermann, M.D.  Electronically Signed     RMR/MEDQ  D:   06/16/2007  T:  06/16/2007  Job:  [id number]   cc:   Leslye Peer, MD  Fax: [place]. [name], M.D.  Fax: [phone]

## 2011-02-07 NOTE — H&P (Signed)
NAME:  Goller, Serenna              ACCOUNT NO.:  [id number]   MEDICAL RECORD NO.:  [id number]          PATIENT TYPE:  AMB   LOCATION:                                FACILITY:  [facility]   PHYSICIAN:  R. Garfield Cornea, M.D. DATE OF BIRTH:  [birth year]   DATE OF ADMISSION:  DATE OF DISCHARGE:  LH                              HISTORY & PHYSICAL   We had attempted to contact Dr. Martie Round office to arrange for  colonoscopy plus/minus EGD. She had been out of the office last week. We  are attempting to coordinate procedures with Dr. Mathis Bud. Dr. Gala Romney  spoke with Dr. Mathis Bud today. The plan is to stop Mr. Hosaka' Coumadin  the day before she is admitted to the hospital. She will be started on a  heparin drip which will be dosed per pharmacy. She is to have an INR the  day 2 to see where we stand as far as proceeding with the procedure. She  is also going to need to start clear liquids around day 2, and she is  going to need SBE prophylaxis. She has a defibrillator; we will need to  contact the company to turn that off as well, and we will consult Dr.  Mathis Bud once she is admitted to the hospital. She was to return  hemoccult cards to [place]. However, Ms. Orth has not done this yet. We  will be contacting her today to remind her to turn those cards in to [place]  as soon as possible. She is also going to need to hold her iron prior to  the procedure.      Vickey Huger, N.P.      Bridgette Habermann, M.D.  Electronically Signed    KJ/MEDQ  D:  05/22/2007  T:  05/22/2007  Job:  [id number]

## 2011-02-07 NOTE — H&P (Signed)
NAME:  [name] [name], [name] [name]              ACCOUNT NO.:  [id number]   MEDICAL RECORD NO.:  [id number]          PATIENT TYPE:  INP   LOCATION:  A227                          FACILITY:  [facility]   PHYSICIAN:  R. Garfield Cornea, M.D. DATE OF BIRTH:  Oct 03, 1931   DATE OF ADMISSION:  06/10/2007  DATE OF DISCHARGE:  LH                              HISTORY & PHYSICAL   PRIMARY CARE PHYSICIAN:  [name] [name], M.D.   CARDIOLOGIST:  Leslye Peer, M.D.   HISTORY OF PRESENT ILLNESS:  [name] [name] is a 77 year old African  American female who has a history of iron-deficiency anemia as well as  adenomatous polyps.  She is due for a colonoscopy, please see my full  consult note from May 13, 2007.  She did return hemoccult cards to  the office.  She was found to be hemoccult negative.  She did have an  anemia profile which showed an iron of 39, a TIBC percent saturation of  13%, TIBC 289, and UIBC 250, ferritin 315.  Folate B12 were normal.  She  denies any GI complaints at this time.  Admission hemoglobin was 9.4  with hematocrit of 28.4.  Her INR is 3.2.  She has not taken her  Coumadin since Saturday.  Admission creatinine is 1.15.  She has been  seen by Dr. Rollene Fare this admission and is followed by Dr. Mathis Bud her  regular cardiologist.  She has a history of defibrillator, mitral and  aortic valve replacement, cardiomyopathy.   PAST MEDICAL/SURGICAL HISTORY:  See note of May 13, 2007.   MEDICATIONS:  Prior to admission:  1. Digoxin 0.25 mg daily.  2. Lasix 40 mg daily.  3. Synthroid 75 mcg daily.  4. KCl 30 mEq daily.  5. Metoprolol XL 100 mg daily.  6. Calcium 500 mg daily.  7. Os-Cal 1500 mg daily.  8. Glipizide 5 mg daily.  9. Vitamin C 500 mg daily.  10.Coumadin as directed daily.  Her Coumadin looks like it is 15 mg      daily.  11.Vytorin 10/20 mg daily.  12.Hydrocodone 5/325 mg q.6 h. p.r.n.  13.Albuterol p.r.n.  14.Colace 1-2 p.r.n.  15.MiraLax 17 grams p.r.n.  16.BiDil  b.i.d.   ALLERGIES:  PENICILLIN.   FAMILY HISTORY:  See HPI as well as full consult note from May 13, 2007.   SOCIAL HISTORY:  See HPI as well as full consult note from May 13, 2007.   REVIEW OF SYSTEMS:  See HPI as well as full consult note from May 13, 2007.   PHYSICAL EXAMINATION:  VITAL SIGNS:  Weight 108 kg, height 68 inches,  temp 97.9, blood pressure 122/63, pulse 69, respirations 18.  GENERAL:  [name] [name] is an obese African American female who is alert,  oriented, pleasant, and cooperative in no acute distress.  HEENT:  Sclerae clear, nonicteric.  Conjunctivae pink.  Oropharynx pink  and moist without any lesions.  NECK:  Supple without any mass, thyromegaly.  CHEST:  Heart rate with a valvular click noted and a 2/6 murmur.  LUNGS:  Clear to auscultation bilaterally.  ABDOMEN:  Positive bowel sounds x4.  No bruits auscultated.  Soft,  nontender, nondistended without palpable masses or hepatosplenomegaly.  No rebound tenderness or guarding.  Exam is limited due to the patient's  body habitus.  EXTREMITIES:  Without clubbing or edema bilaterally.  SKIN:  Brown, warm, and dry without rash or jaundice.   LABORATORY STUDIES:  See HPI.  WBC 10.1, platelets 258, calcium 8.7.  Sodium 137, potassium 3.9, chloride 101, CO2 32, BUN 24, creatinine  1.15, and glucose 73.  Total bilirubin 0.6, alkaline phosphatase 44, AST  14, ALT 13, total protein 7.  Albumin 3.2.  And, urinalysis positive for  trace blood.   IMPRESSION:  [name] [name] is a 77 year old female with a history of iron-  deficiency anemia despite normal ferritin which may be an acute phase  reaction.  She does have a low TIBC percent saturation and a low iron.  She is hemoccult negative.  She has a history of adenomatous polyps and  is due for followup surveillance as well.   She is going to need colonoscopy and EGD for further evaluation of her  iron-deficiency anemia.  We appreciate cardiology's input.   She has been  started on heparin.  We will plan on procedure when INR is 1.6 or less.  She will need her heparin stopped at least 4 hours prior to the  procedure.  She is going to need her defibrillator turned off prior to  the procedure.  She is going to need SBE prophylaxis prior to the  procedure.   PLAN:  1. See admission orders.  2. We will continue to check INR and plan on colonoscopy and EGD once      INR is 1.6 or less.  3. Hold heparin for 4 hours prior to the procedure.  4. Plan for defibrillator coverage to be discontinued day of      procedure.  5. SBE prophylaxis to be given prior to procedures.  6. Colonoscopy and EGD.  7. Further recommendations pending procedures.  8. Pharmacy to cover heparin.      Vickey Huger, N.P.      Bridgette Habermann, M.D.  Electronically Signed    KJ/MEDQ  D:  06/10/2007  T:  06/10/2007  Job:  [id number]   cc:   [name] [name], M.D.  Fax: [phone]   Leslye Peer, MD  Fax: [phone]

## 2011-02-07 NOTE — Consult Note (Signed)
NAME:  [name] [name], Aryiana              ACCOUNT NO.:  [id number]   MEDICAL RECORD NO.:  [id number]          PATIENT TYPE:  AMB   LOCATION:                                FACILITY:  [facility]   PHYSICIAN:  R. Garfield Cornea, M.D. DATE OF BIRTH:  16-Sep-1932   DATE OF CONSULTATION:  DATE OF DISCHARGE:                                 CONSULTATION   REQUESTING PHYSICIAN:  Dr. [name].   REASON FOR CONSULTATION:  Iron deficiency anemia/needs  colonoscopy/history of adenomatous polyps   HISTORY OF PRESENT ILLNESS:  [name] [name] is a 77 year old female who  tells me she is due for colonoscopy.  She gives history of adenomatous  polyps; last colonoscopy was December 16, 2002 by Dr. Velda Shell at [facility].  She had small hemorrhoids at that  time.  She has a history of chronic iron deficiency anemia; she has been  on iron for years.  She tells me that Dr. [name] recently increased her  dose of iron to 650 mg daily.  She denies any rectal bleeding or melena,  denies any abdominal pain, nausea or vomiting, denies any heartburn,  indigestion, dysphagia or odynophagia.  Rarely, she has constipation.  She does take Colace, which seems to help.  She also uses MiraLax on a  p.r.n. basis.   PAST MEDICAL AND SURGICAL HISTORY:  1. History of adenomatous polyps, last colonoscopy as described in      HPI.  2. Chronic iron deficiency anemia.  3. Diabetes mellitus.  4. Hypothyroidism.  5. Hyperlipidemia.  6. She is status post defibrillator placement.  She has a mitral and      aortic valve and is on chronic Coumadin therapy.  7. She has a history of cardiomyopathy and decreased ejection      fraction; she is followed by Dr. Mathis Bud.   CURRENT MEDICATIONS:  1. Lasix 40 mg daily.  2. Lanoxin 0.25 mg daily.  3. Synthroid 75 mcg daily.  4. Potassium 20 mEq daily.  5. BiDil two daily.  6. Coumadin 15 mg daily.  7. Toprol-XL 100 mg daily.  8. Os-Cal 500 mg plus vitamin D t.i.d.  9. Vitamin C 500 mg daily.  10.Glipizide 5 mg daily.  11.Vytorin 10/20 mg daily.  12.Colace 100 mg p.r.n.  13.Ferrous sulfate 325 mg two p.o. daily.   ALLERGIES:  PENICILLIN.   FAMILY HISTORY:  Positive for mother with pancreatic cancer and deceased  at age 75.   SOCIAL HISTORY:  Ms. Liverman is a widow.  She moved here in 2004 from  [place].  She lives with her brother.  She does not have any children.  She is a retired [employer] copy.  She denies any tobacco, alcohol or drug  use.   REVIEW OF SYSTEMS:  MUSCULOSKELETAL:  She complains of right knee pain,  status post injury, otherwise negative.   PHYSICAL EXAMINATION:  VITAL SIGNS:  Weight 243 pounds, height 69  inches.  Temperature 98.9, blood pressure 110/60 and pulse 72.  GENERAL:  [name] is an obese African American female who is alert, oriented,  pleasant  and cooperative, in no acute distress.  HEENT:  Pupils are equal and clear.  Sclerae anicteric.  Conjunctivae  pink.  Oropharynx pink and moist without any lesions.  NECK:  Supple without any mass or thyromegaly.  CHEST:  Heart regular rate and rhythm.  She does have a valvular click  heard as well as a 2/6 murmur noted.  LUNGS:  Clear to auscultation bilaterally.  ABDOMEN:  Positive bowel sounds x4.  No bruits auscultated.  Soft,  nontender and non-distended without palpable mass or hepatosplenomegaly.  No rebound, tenderness or guarding.  EXTREMITIES:  Without clubbing or edema bilaterally.  SKIN:  Warm and dry without any rash or jaundice.   IMPRESSION:  [name] [name] is a 77 year old female with a history of  adenomatous polyps.  She also has chronic iron-deficiency anemia,  Hemoccult status unknown.  She has a mechanical heart valve, on chronic  Coumadin therapy.  She also has a decreased ejection fraction and is  followed by Dr. Mathis Bud.  She is going to need heparin bridge for her  procedures.  Neil Crouch, P.A.-C has spoken with Dr. Mathis Bud, who  would like  to see [name] [name] outpatient the day she gets admitted to  the hospital in order to assist with management of her care.   As far as her anemia is concerned, her hemoglobin was 10.3, hematocrit  34 and MCV 86.7.  I do not have her anemia profile.   PLAN:  1. [name] is going to need to be admitted to the hospital and      started on heparin bridge prior to colonoscopy.  She also has a      defibrillator which we will need to address prior to the procedure      as well.  2. Will defer to Dr. Mathis Bud for appropriate antibiotic coverage      prior to colonoscopy and EGD.  3. EGD and colonoscopy once hospitalized.  I have discussed both      procedures which include risks and benefits, which include, but are      not limited to bleeding, infection, perforation or drug reaction.      She agrees and signed consent will be obtained.  4. We will request anemia profile from Dr. Griffin Dakin office.  5. Hemoccult stools x3.   We would like to thank Dr. [name] for allowing [place] to participate in the  care of Ms. Gruman and also would like to thank Dr. Mathis Bud for  helping to manage the care of [name] [name].      Vickey Huger, N.P.      Bridgette Habermann, M.D.  Electronically Signed    KJ/MEDQ  D:  05/13/2007  T:  05/14/2007  Job:  [id number]   cc:   [name] [name], M.D.  Fax: [phone]   Leslye Peer, MD  Fax: [phone]

## 2011-02-07 NOTE — H&P (Signed)
NAME:  Laird, Trinka              ACCOUNT NO.:  [id number]   MEDICAL RECORD NO.:  [id number]          PATIENT TYPE:  AMB   LOCATION:                                FACILITY:  [facility]   PHYSICIAN:  R. Garfield Cornea, M.D. DATE OF BIRTH:  Jan 31, 1932   DATE OF ADMISSION:  DATE OF DISCHARGE:  LH                              HISTORY & PHYSICAL   We had attempted to contact Dr. Martie Round office to arrange for  colonoscopy plus/minus EGD. She had been out of the office last week. We  are attempting to coordinate procedures with Dr. Mathis Bud. Dr. Gala Romney  spoke with Dr. Mathis Bud today. The plan is to stop Mr. Sory' Coumadin  the day before she is admitted to the hospital. She will be started on a  heparin drip which will be dosed per pharmacy. She is to have an INR the  day 2 to see where we stand as far as proceeding with the procedure. She  is also going to need to start clear liquids around day 2, and she is  going to need SBE prophylaxis. She has a defibrillator; we will need to  contact the company to turn that off as well, and we will consult Dr.  Mathis Bud once she is admitted to the hospital. She was to return  hemoccult cards to [place]. However, Ms. Teer has not done this yet. We  will be contacting her today to remind her to turn those cards in to [place]  as soon as possible. She is also going to need to hold her iron prior to  the procedure.      Vickey Huger, N.P.      Bridgette Habermann, M.D.  Electronically Signed    KJ/MEDQ  D:  05/22/2007  T:  05/22/2007  Job:  [id number]

## 2011-02-10 NOTE — Cardiovascular Report (Signed)
NAME:  [name], [name]              ACCOUNT NO.:  [id number]   MEDICAL RECORD NO.:  [id number]          PATIENT TYPE:  INP   LOCATION:  4731                         FACILITY:  [facility]   PHYSICIAN:  [name] [name], M.D.    DATE OF BIRTH:  [birth year]   DATE OF PROCEDURE:  04/13/2005  DATE OF DISCHARGE:                              CARDIAC CATHETERIZATION   PROCEDURE PERFORMED:  1.  Explant, dual-chamber pacemaker.  2.  Upgrade to dual chamber implantable cardioverter-defibrillator.  3.  Left axillary venogram.  4.  Fluoroscopy for interpretation.  5.  Pocket revision to accept a larger device.   DIAGNOSES:  1.  Nonischemic cardiomyopathy, ejection fraction less than 30%.  2.  Class III congestive heart failure.  3.  Wide complex QRS secondary to pacing.   COMPLICATIONS:  None.   OPERATOR:  [name] [name], M.D.   ESTIMATED BLOOD LOSS:  Less than 30 mL.   MEDICATIONS:  Vancomycin, Versed, fentanyl, Phenergan.  Local 1% lidocaine  used.   PROCEDURE IN DETAIL:  The patient was brought to the EP laboratory in a  fasted state.  The patient's left pectoral region was prepped and draped in  the usual manner.  Vancomycin 1 g IV infused immediately prior to skin  incision for antimicrobial prophylaxis.  Local anesthesia with subcutaneous  tissue injection of 1% lidocaine.  Conscious sedation with intermittent  injections of midazolam and fentanyl.  The previously used incision line was  anesthetized with subcutaneous lidocaine.  Using a combination of sharp and  blunt dissection, an incision line was developed to the level of the  implanted pacemaker capsule.  Care was used to avoid injuring the leads.  The capsule was sharply incised and the pacemaker, a Medtronic dual-chamber  device, was delivered to the skin surface without a problem.  The leads were  disconnected and checked electronically.  The patient had a relatively fixed  complete heart block with a junctional escape of  approximately 35-40  beats  per minute.  After determining adequate characteristics of the atrial and  ventricular leads, access was attempted for implantation for RV  defibrillator lead and left ventricular lead via coronary sinus for  resynchronization.  A left axillary venogram was performed which  demonstrated an extremely tortuous subclavian brachiocephalic complex with  significant stenosis of the subclavian proximal to the implantation site for  the chronically implanted atrial and ventricular leads.  It was relatively  straightforward to gain access using a thin-walled needle and place 2 J-  tipped 0.035-inch wires into the central venous circulation.  The more  medial site was used to implant a 7-French safe sheath system, which was  advanced without difficulty, but it was extremely difficult to implant the  lateral one required for the CS cannulation to be a 9.5-French system.  Via  the more proximal access site, a Medtronic lead, model [id number] active-fixation  defibrillator coil was advanced into the superior vena cava.  Due to the  marked tortuosity of the venous anatomy, it was extremely difficult to  implant a lead in the right ventricle, but after multiple attempts,  we got  an excellent right ventricular apical implant along the septum.  The major  technical difficulty was inability to torque and guide the lead secondary to  tortuosity interactions.  The lead was actively affixed to the right  ventricular apex.  Active electronic characteristics returned and the lead  was sutured in place to the prepectoral fascia using 0 silk ligatures.  Given the tortuosity and the difficulty secondary to fibrosis and the  stenosis of the vein, it was felt to be not appropriate to try to attempt to  place a left ventricular lead secondary to a very low likelihood of success.  Therefore, the J-tipped wire was removed from the venous circulation without  difficulty and hemostasis was easily  gained.  The chronically implanted  atrial lead, a model #5076, Medtronic, was connected to a Medtronic dual-  chamber ICD, model D1548TG, serial [id number], which was also connected to  the newly implanted defibrillation lead, model #6949.  The pacemaker that  was removed was relatively small and the ICD that needed to be implanted was  considerably larger.  I had to do extensive remodeling of the medial and  inferior aspects of the capsule and pocket with care to avoid injuring the  leads to accept the larger device.  Hemostasis was relatively easily  obtained.  The pocket was actually quite dry.  The whole area was copiously  irrigated with antibiotics.  The defibrillator lead system was easily  introduced into the appropriately sized pocket.  The pocket was closed in  layers of 2-0 and 4-0 Vicryl.  The skin incision was reinforced with Steri-  Strips and a large bulky pressure dressing was applied.  The patient  tolerated the procedure well to include safety margin testing and returned  to the holding area in a stable hemodynamic condition.   Through the analyzer, the electrical characteristics of the atrial lead,  which is a chronic lead, demonstrated a capture threshold of 0.6 volts at  0.5 msec, impedance 907 ohms, P wave of 4.3 mV.  The newly implanted pacing  characteristics of the RV apical lead demonstrated a capture threshold of  0.7 volts at 0.5 msec, impedance 897 ohms and an R wave of 29.2 mV.  There  was absence of diaphragmatic pacing on the newly implanted lead.  It was  very difficult to initiate ventricular fibrillation in this woman.  P waves  shocks were performed at 08 and 1.4 volts with initiation of a VT which self-  terminated before defibrillation.  Using a 50-Hz high-output pacing on 2  different occasions, once for 3-4 seconds and once for 6-8 seconds, there  was inability to induce a ventricular tachyarrhythmia.  Finally, a T wave shock at 1.4 joules given at  320 msec after a drive train of [id number] msec  resulted in initiation of a good episode of sustained ventricular  fibrillation.  The device adequately recognized this, rapidly charged and  was successful with a single 20-joule shock to result in resumption of a  perfusing rhythm.  This correlates with a greater than 10-joule safety  margin for this device.  Device was programmed in a similar way to her  pacemaker chronically, which was dual-chamber pacing with rate response on,  lower rate 60, upper rate 130.  Output in the atrium was returned to the  chronic settings of 2 volts at 0.5 msec.  In the ventricle acutely she was  programmed at 5 volts at 0.5 msec.  A single zone of  ventricular  fibrillation treatment was programmed on for heart rates greater than 185  beats per minute.  The patient received ATP on charging followed by a 30-  joule shock, followed by maximum outputs of the device.       MEP/MEDQ  D:  04/13/2005  T:  04/14/2005  Job:  [id number]

## 2011-02-14 ENCOUNTER — Encounter: Payer: Self-pay | Admitting: Family Medicine

## 2011-02-14 ENCOUNTER — Ambulatory Visit (INDEPENDENT_AMBULATORY_CARE_PROVIDER_SITE_OTHER): Payer: Medicare Other | Admitting: Family Medicine

## 2011-02-14 VITALS — BP 114/72 | HR 78 | Resp 16 | Ht 67.0 in | Wt 238.4 lb

## 2011-02-14 DIAGNOSIS — E119 Type 2 diabetes mellitus without complications: Secondary | ICD-10-CM

## 2011-02-14 DIAGNOSIS — M479 Spondylosis, unspecified: Secondary | ICD-10-CM

## 2011-02-14 DIAGNOSIS — J309 Allergic rhinitis, unspecified: Secondary | ICD-10-CM | POA: Insufficient documentation

## 2011-02-14 DIAGNOSIS — E785 Hyperlipidemia, unspecified: Secondary | ICD-10-CM

## 2011-02-14 DIAGNOSIS — E669 Obesity, unspecified: Secondary | ICD-10-CM

## 2011-02-14 DIAGNOSIS — E039 Hypothyroidism, unspecified: Secondary | ICD-10-CM

## 2011-02-14 DIAGNOSIS — I10 Essential (primary) hypertension: Secondary | ICD-10-CM

## 2011-02-14 MED ORDER — METOPROLOL SUCCINATE ER 100 MG PO TB24: 100.0000 mg | ORAL_TABLET | Freq: Every day | ORAL | Status: DC

## 2011-02-14 MED ORDER — LEVOTHYROXINE SODIUM 75 MCG PO TABS: 75.0000 ug | ORAL_TABLET | Freq: Every day | ORAL | Status: DC

## 2011-02-14 MED ORDER — GLIPIZIDE 5 MG PO TABS: 5.0000 mg | ORAL_TABLET | Freq: Every day | ORAL | Status: DC

## 2011-02-14 MED ORDER — POTASSIUM CHLORIDE CRYS ER 20 MEQ PO TBCR: EXTENDED_RELEASE_TABLET | ORAL | Status: DC

## 2011-02-14 MED ORDER — FUROSEMIDE 40 MG PO TABS: 40.0000 mg | ORAL_TABLET | Freq: Every day | ORAL | Status: DC

## 2011-02-14 MED ORDER — LORATADINE 10 MG PO TBDP: 10.0000 mg | ORAL_TABLET | Freq: Every day | ORAL | Status: DC

## 2011-02-14 MED ORDER — GABAPENTIN 100 MG PO CAPS: ORAL_CAPSULE | ORAL | Status: DC

## 2011-02-14 NOTE — Assessment & Plan Note (Signed)
Deteriorated, will add loratidine  And sudafed as needed

## 2011-02-14 NOTE — Patient Instructions (Addendum)
F/U in 4 months.  Pls reduce the dose of the vytorin, reduce to Monday, Wednesday Friday and Sunday only, take 1.  Your blood sugar is improved, congrats , keep it up  Thyroid test is normal   Use sudafed one daily for the next 5 days, for the ear fullness. I will send in loratidine for your allergies this will help  Congrats on new exercise routine and weight losss.  Pls use your oxygen every night.  Microalb  Today  Fasting lipid, cmp and EGFR, HBA1C and TSH in 4 months

## 2011-02-15 LAB — MICROALBUMIN / CREATININE URINE RATIO
Creatinine, Urine: 106 mg/dL
Microalb Creat Ratio: 26.2 mg/g (ref 0.0–30.0)
Microalb, Ur: 2.78 mg/dL — ABNORMAL HIGH (ref 0.00–1.89)

## 2011-02-20 NOTE — Assessment & Plan Note (Signed)
Unchanged, pt applauded on incerased activity and recent weigh loss, advised her both wil help arthiritis, she is motivated

## 2011-02-20 NOTE — Assessment & Plan Note (Signed)
Improved. Pt applauded on succesful weight loss through lifestyle change, and encouraged to continue same. Weight loss goal set for the next several months.  

## 2011-02-20 NOTE — Assessment & Plan Note (Signed)
Improved and controlled, no med change 

## 2011-02-20 NOTE — Assessment & Plan Note (Signed)
Controlled , no med change °

## 2011-02-20 NOTE — Assessment & Plan Note (Signed)
Improved, reduction in medication dosage

## 2011-02-20 NOTE — Progress Notes (Signed)
  Subjective:    Patient ID: [name] [name], female    DOB: [birth year], 77 y.o.   MRN: [id number]  HPI HYPERTENSION Disease Monitoring Blood pressure range-unknown Chest pain- no      Dyspnea- no Medications Compliance- good Lightheadedness- no   Edema- no   DIABETES Disease Monitoring Blood Sugar ranges-fasting 110 to 120 Polyuria- no New Visual problems- no Medications Compliance- good  Hypoglycemic symptoms- no   HYPERLIPIDEMIA Disease Monitoring See symptoms for Hypertension Medications Compliance- good RUQ pain- no  Muscle aches- no  ROS See HPI above     Review of Systems    Denies recent fever or chills. Denies sinus pressure, nasal congestion, ear pain or sore throat.However she does report increased nasal congestion with clear drainage in the past 1 month. Denies chest congestion, productive cough or wheezing. Denies chest pains, palpitations, paroxysmal nocturnal dyspnea, orthopnea and leg swelling Denies abdominal pain,chronic belching and  Nausea,denies  vomiting,diarrhea or constipation.  Denies rectal bleeding or change in bowel movement. Denies dysuria, frequency, hesitancy or incontinence. Chronic  joint pain, swelling and limitation in mobility. Denies headaches, seizure, numbness, or tingling. Denies depression, anxiety or insomnia. Denies skin break down or rash.     Objective:   Physical Exam Patient alert and oriented and in no Cardiopulmonary distress.  HEENT: No facial asymmetry, EOMI, no sinus tenderness, TM's clear, Oropharynx pink and moist.  Neck supple no adenopathy. Nasal mucosa slightly edematous and erythematous  Chest: Clear to auscultation bilaterally.  CVS: S1, S2 no murmurs, no S3.  ABD: Soft mild epigastric tenderness, no guarding or rebound. Bowel sounds normal.  Ext: No edema  [id number] ROM spine, shoulders, hips and knees.  Skin: Intact, no ulcerations or rash noted.  Psych: Good eye contact, normal affect.  Memory intact not anxious or depressed appearing.  CNS: CN 2-12 intact, power, tone and sensation normal throughout.        Assessment & Plan:

## 2011-02-21 ENCOUNTER — Other Ambulatory Visit (HOSPITAL_COMMUNITY): Payer: Self-pay | Admitting: Oncology

## 2011-02-21 ENCOUNTER — Telehealth: Payer: Self-pay | Admitting: Family Medicine

## 2011-02-21 ENCOUNTER — Encounter (HOSPITAL_COMMUNITY): Payer: Medicare Other | Attending: Oncology

## 2011-02-21 DIAGNOSIS — D599 Acquired hemolytic anemia, unspecified: Secondary | ICD-10-CM | POA: Insufficient documentation

## 2011-02-21 DIAGNOSIS — E119 Type 2 diabetes mellitus without complications: Secondary | ICD-10-CM | POA: Insufficient documentation

## 2011-02-21 DIAGNOSIS — D638 Anemia in other chronic diseases classified elsewhere: Secondary | ICD-10-CM | POA: Insufficient documentation

## 2011-02-21 DIAGNOSIS — D649 Anemia, unspecified: Secondary | ICD-10-CM

## 2011-02-21 DIAGNOSIS — Z954 Presence of other heart-valve replacement: Secondary | ICD-10-CM | POA: Insufficient documentation

## 2011-02-21 DIAGNOSIS — R5383 Other fatigue: Secondary | ICD-10-CM | POA: Insufficient documentation

## 2011-02-21 DIAGNOSIS — R5381 Other malaise: Secondary | ICD-10-CM | POA: Insufficient documentation

## 2011-02-21 LAB — CBC
HCT: 32 % — ABNORMAL LOW (ref 36.0–46.0)
Hemoglobin: 10.1 g/dL — ABNORMAL LOW (ref 12.0–15.0)
MCH: 28.5 pg (ref 26.0–34.0)
MCHC: 31.6 g/dL (ref 30.0–36.0)
MCV: 90.4 fL (ref 78.0–100.0)
Platelets: 257 10*3/uL (ref 150–400)
RBC: 3.54 MIL/uL — ABNORMAL LOW (ref 3.87–5.11)
RDW: 15.5 % (ref 11.5–15.5)
WBC: 5.6 10*3/uL (ref 4.0–10.5)

## 2011-02-21 LAB — DIFFERENTIAL
Basophils Absolute: 0 10*3/uL (ref 0.0–0.1)
Basophils Relative: 0 % (ref 0–1)
Eosinophils Absolute: 0 10*3/uL (ref 0.0–0.7)
Eosinophils Relative: 0 % (ref 0–5)
Lymphocytes Relative: 17 % (ref 12–46)
Lymphs Abs: 0.9 10*3/uL (ref 0.7–4.0)
Monocytes Absolute: 0.3 10*3/uL (ref 0.1–1.0)
Monocytes Relative: 6 % (ref 3–12)
Neutro Abs: 4.4 10*3/uL (ref 1.7–7.7)
Neutrophils Relative %: 78 % — ABNORMAL HIGH (ref 43–77)

## 2011-02-21 LAB — IRON AND TIBC
Iron: 67 ug/dL (ref 42–135)
Saturation Ratios: 15 % — ABNORMAL LOW (ref 20–55)
TIBC: 449 ug/dL (ref 250–470)
UIBC: 382 ug/dL

## 2011-02-21 LAB — FOLATE: Folate: >20 ng/mL

## 2011-02-21 LAB — LACTATE DEHYDROGENASE: LDH: 278 U/L — ABNORMAL HIGH (ref 94–250)

## 2011-02-21 LAB — RETICULOCYTES
RBC.: 3.54 MIL/uL — ABNORMAL LOW (ref 3.87–5.11)
Retic Count, Absolute: 42.5 10*3/uL (ref 19.0–186.0)
Retic Ct Pct: 1.2 % (ref 0.4–3.1)

## 2011-02-21 LAB — FERRITIN: Ferritin: 349 ng/mL — ABNORMAL HIGH (ref 10–291)

## 2011-02-21 LAB — VITAMIN B12: Vitamin B-12: 410 pg/mL (ref 211–911)

## 2011-02-21 NOTE — Telephone Encounter (Signed)
Med sent in and also called mail order to check and they are processing

## 2011-02-22 ENCOUNTER — Other Ambulatory Visit: Payer: Self-pay | Admitting: Family Medicine

## 2011-02-22 ENCOUNTER — Other Ambulatory Visit (HOSPITAL_COMMUNITY): Payer: Self-pay | Admitting: Family Medicine

## 2011-02-22 ENCOUNTER — Ambulatory Visit (HOSPITAL_COMMUNITY)
Admission: RE | Admit: 2011-02-22 | Discharge: 2011-02-22 | Disposition: A | Discharge status: Home / Self Care | Payer: Medicare Other | Source: Ambulatory Visit | Attending: Family Medicine | Admitting: Family Medicine

## 2011-02-22 DIAGNOSIS — D249 Benign neoplasm of unspecified breast: Secondary | ICD-10-CM

## 2011-02-22 DIAGNOSIS — Z09 Encounter for follow-up examination after completed treatment for conditions other than malignant neoplasm: Secondary | ICD-10-CM

## 2011-02-22 DIAGNOSIS — N63 Unspecified lump in unspecified breast: Secondary | ICD-10-CM | POA: Insufficient documentation

## 2011-02-22 LAB — HAPTOGLOBIN: Haptoglobin: <7 mg/dL — ABNORMAL LOW (ref 30–200)

## 2011-02-24 ENCOUNTER — Ambulatory Visit (HOSPITAL_COMMUNITY): Payer: Self-pay

## 2011-02-24 ENCOUNTER — Telehealth: Payer: Self-pay | Admitting: Family Medicine

## 2011-02-24 NOTE — Telephone Encounter (Signed)
Need more info from patient, called patient, no answer

## 2011-02-27 ENCOUNTER — Telehealth: Payer: Self-pay | Admitting: Family Medicine

## 2011-02-27 NOTE — Telephone Encounter (Signed)
Patient states Dr [name] sent in for generic Toprol and she doesn't use generic in that. Already has med.

## 2011-03-06 ENCOUNTER — Other Ambulatory Visit: Payer: Self-pay | Admitting: Family Medicine

## 2011-03-06 ENCOUNTER — Encounter (HOSPITAL_COMMUNITY): Payer: Medicare Other | Attending: Oncology | Admitting: Oncology

## 2011-03-06 DIAGNOSIS — D649 Anemia, unspecified: Secondary | ICD-10-CM

## 2011-03-11 ENCOUNTER — Encounter (HOSPITAL_COMMUNITY): Payer: Self-pay

## 2011-03-17 ENCOUNTER — Telehealth: Payer: Self-pay | Admitting: Family Medicine

## 2011-03-17 NOTE — Telephone Encounter (Signed)
Called patient, no option to leave message

## 2011-03-17 NOTE — Telephone Encounter (Signed)
Iadvise [name] [name] take the hydrocodone prescribed by dr Luna Glasgow one twice daily for the next 2 to 3 days, and try and get an apptt to see him asap so he can help her with this, I think he has given her an injection on the past, I cannot , inject the joint, sorry

## 2011-03-20 ENCOUNTER — Encounter: Payer: Self-pay | Admitting: Family Medicine

## 2011-03-20 NOTE — Telephone Encounter (Signed)
Patient has appt with Dr Luna Glasgow in two weeks, states she was concerned it was her kidneys, advised patient to come in for nurse visit to check urine, patient agrees

## 2011-03-21 ENCOUNTER — Encounter: Payer: Self-pay | Admitting: Family Medicine

## 2011-03-21 ENCOUNTER — Ambulatory Visit (INDEPENDENT_AMBULATORY_CARE_PROVIDER_SITE_OTHER): Payer: Medicare Other | Admitting: Family Medicine

## 2011-03-21 ENCOUNTER — Telehealth: Payer: Self-pay | Admitting: Family Medicine

## 2011-03-21 VITALS — BP 120/70 | HR 77 | Resp 16 | Ht 68.5 in | Wt 239.8 lb

## 2011-03-21 DIAGNOSIS — E039 Hypothyroidism, unspecified: Secondary | ICD-10-CM

## 2011-03-21 DIAGNOSIS — E785 Hyperlipidemia, unspecified: Secondary | ICD-10-CM

## 2011-03-21 DIAGNOSIS — I10 Essential (primary) hypertension: Secondary | ICD-10-CM

## 2011-03-21 DIAGNOSIS — J019 Acute sinusitis, unspecified: Secondary | ICD-10-CM

## 2011-03-21 DIAGNOSIS — R109 Unspecified abdominal pain: Secondary | ICD-10-CM

## 2011-03-21 DIAGNOSIS — J309 Allergic rhinitis, unspecified: Secondary | ICD-10-CM

## 2011-03-21 MED ORDER — FLUTICASONE PROPIONATE 50 MCG/ACT NA SUSP: 1.0000 | Freq: Every day | NASAL | Status: DC

## 2011-03-21 MED ORDER — CIPROFLOXACIN HCL 500 MG PO TABS: 500.0000 mg | ORAL_TABLET | Freq: Two times a day (BID) | ORAL | Status: AC

## 2011-03-21 NOTE — Patient Instructions (Addendum)
F/u as before  You need to start medication for seasonal allergies, pls use the nasal spray. Your urine  Will be checked, and and med will be sent  In for infection in your sinuses  No other medication changes at this time

## 2011-03-21 NOTE — Telephone Encounter (Signed)
Patient has appt today.

## 2011-03-22 LAB — POCT URINALYSIS DIPSTICK
Bilirubin, UA: NEGATIVE
Blood, UA: NEGATIVE
Glucose, UA: NEGATIVE
Ketones, UA: NEGATIVE
Leukocytes, UA: NEGATIVE
Nitrite, UA: NEGATIVE
Protein, UA: NEGATIVE
Spec Grav, UA: 1.01
Urobilinogen, UA: 0.2
pH, UA: 6

## 2011-03-28 ENCOUNTER — Telehealth: Payer: Self-pay | Admitting: Family Medicine

## 2011-03-28 NOTE — Telephone Encounter (Signed)
Patient aware test normal

## 2011-03-31 ENCOUNTER — Telehealth: Payer: Self-pay | Admitting: *Deleted

## 2011-03-31 ENCOUNTER — Other Ambulatory Visit: Payer: Self-pay | Admitting: Family Medicine

## 2011-03-31 DIAGNOSIS — S09309A Unspecified injury of unspecified middle and inner ear, initial encounter: Secondary | ICD-10-CM

## 2011-03-31 NOTE — Telephone Encounter (Signed)
pls refer to ent dr Benjamine Mola, eval ear pain possible foreign object in ear with bleeding, referral will be entered

## 2011-03-31 NOTE — Assessment & Plan Note (Signed)
Controlled, no change in medication  Low fat diet encouraged and discussed

## 2011-03-31 NOTE — Assessment & Plan Note (Signed)
Controlled, no change in medication  

## 2011-03-31 NOTE — Assessment & Plan Note (Signed)
Acute sinus infection, medication sent in for same

## 2011-03-31 NOTE — Assessment & Plan Note (Signed)
Uncontrolled symptoms, pt to start nasal spray

## 2011-03-31 NOTE — Progress Notes (Signed)
  Subjective:    Patient ID: [name] [name], female    DOB: [birth year], 77 y.o.   MRN: [id number]  HPI 5 day h/o increased nasal congestion with sinus pressure and yellow green nasal drainage. She has also had intermittent chills, she denies any fever . She has no cough. Pt reports that her blood sugars are seldom over 120 in the mornings.  She denies any hypoglycemic episodes, polyuria or polydypsia   Review of Systems Denies chest pains, palpitations, paroxysmal nocturnal dyspnea, orthopnea and leg swelling Denies abdominal pain, nausea, vomiting,diarrhea or constipation.  Denies rectal bleeding or change in bowel movement. Denies dysuria, frequency, hesitancy or incontinence. Chronic back and hip pain limiting mobility Denies headaches, seizure, numbness, or tingling. Denies depression, anxiety or insomnia. Denies skin break down or rash.        Objective:   Physical Exam Patient alert and oriented and in no Cardiopulmonary distress.  HEENT: No facial asymmetry, EOMI, maxillary  sinus tenderness, TM's clear, Oropharynx pink and moist.  Neck supple no adenopathy Nasal mucosa erythematous and edematous.  Chest: Clear to auscultation bilaterally.  CVS: S1, S2 no murmurs, no S3.  ABD: Soft non tender. Bowel sounds normal.  Ext: No edema  [id number]  ROM spine, shoulders, hips and knees.  Skin: Intact, no ulcerations or rash noted.  Psych: Good eye contact, normal affect. Memory intact not anxious or depressed appearing.  CNS: CN 2-12 intact, power, tone and sensation normal throughout.        Assessment & Plan:

## 2011-04-11 ENCOUNTER — Other Ambulatory Visit: Payer: Self-pay | Admitting: Family Medicine

## 2011-04-13 ENCOUNTER — Ambulatory Visit (INDEPENDENT_AMBULATORY_CARE_PROVIDER_SITE_OTHER): Payer: Medicare Other | Admitting: Otolaryngology

## 2011-04-13 DIAGNOSIS — H73009 Acute myringitis, unspecified ear: Secondary | ICD-10-CM

## 2011-04-16 IMAGING — CR DG CHEST 2V
2 series · 2 of 2 positions shown · non-contrast
Comparison: 07/18/2009.

CLINICAL DATA: Shortness of breath.

CHEST - 2 VIEW

[view not recorded (1 of 2)]
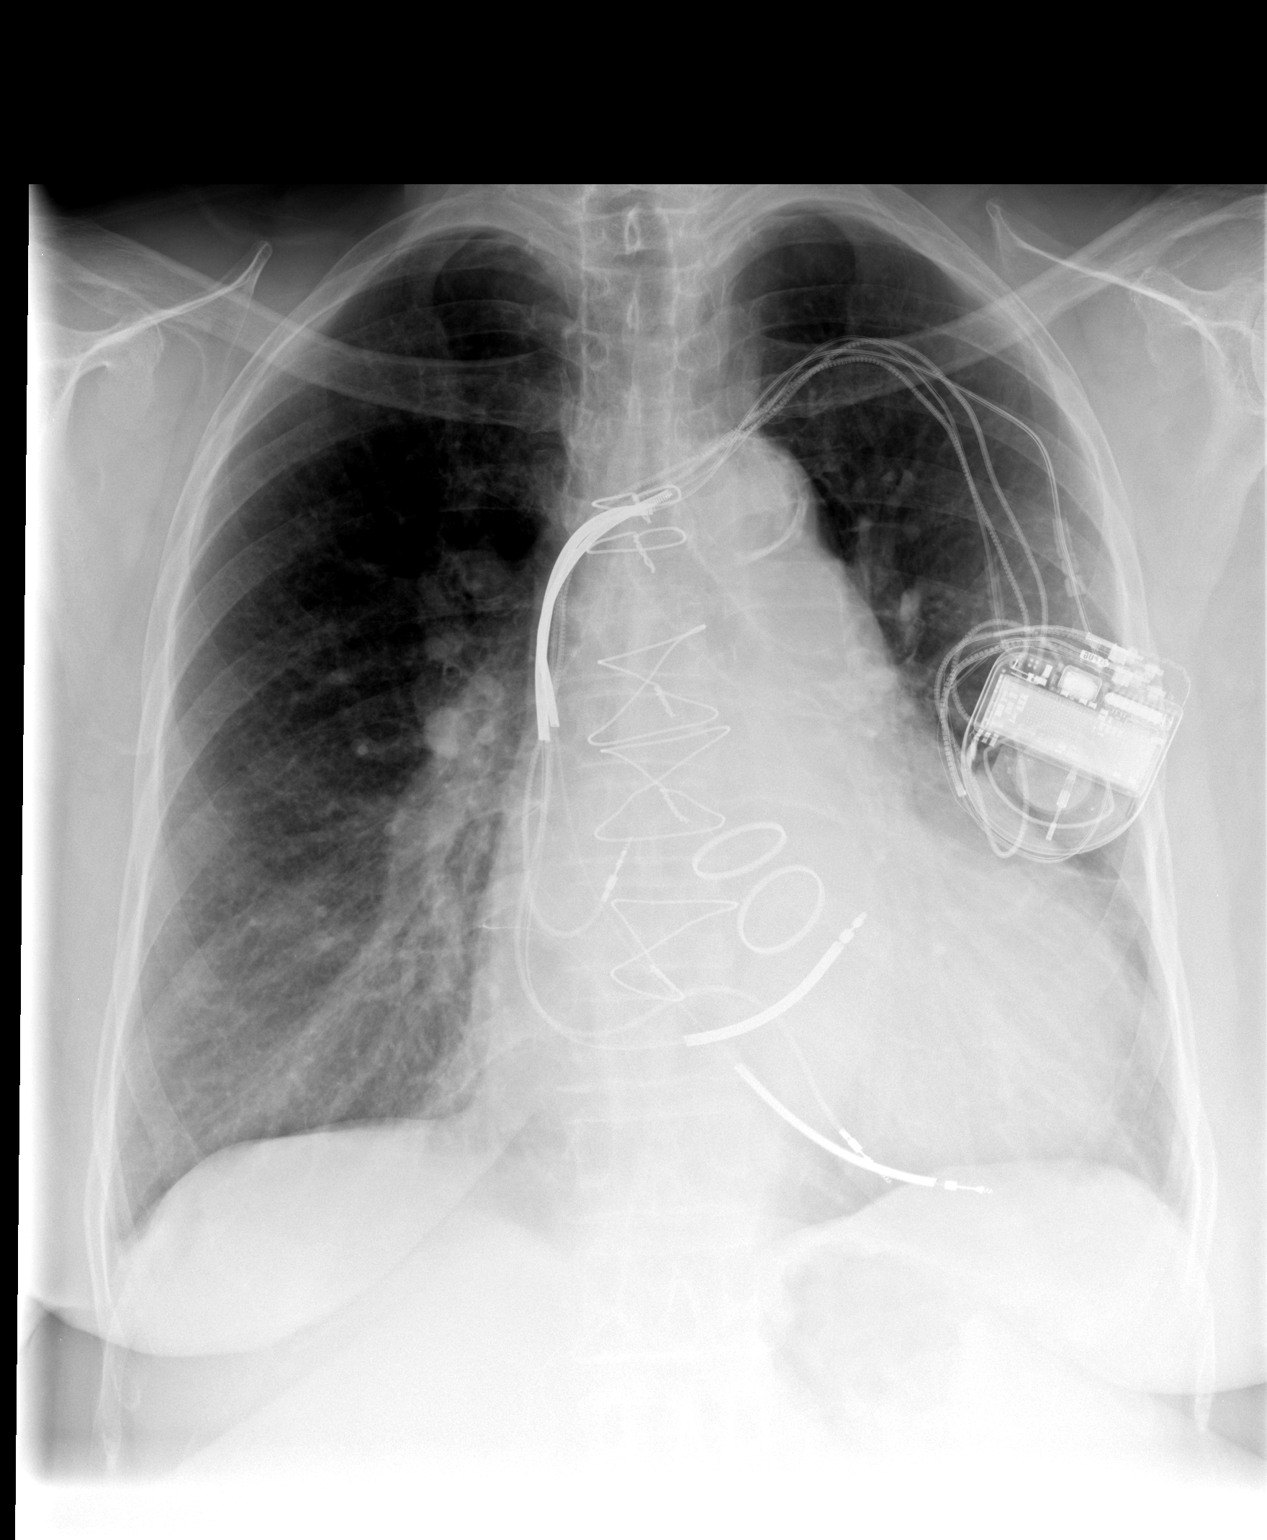

[view not recorded (2 of 2)]
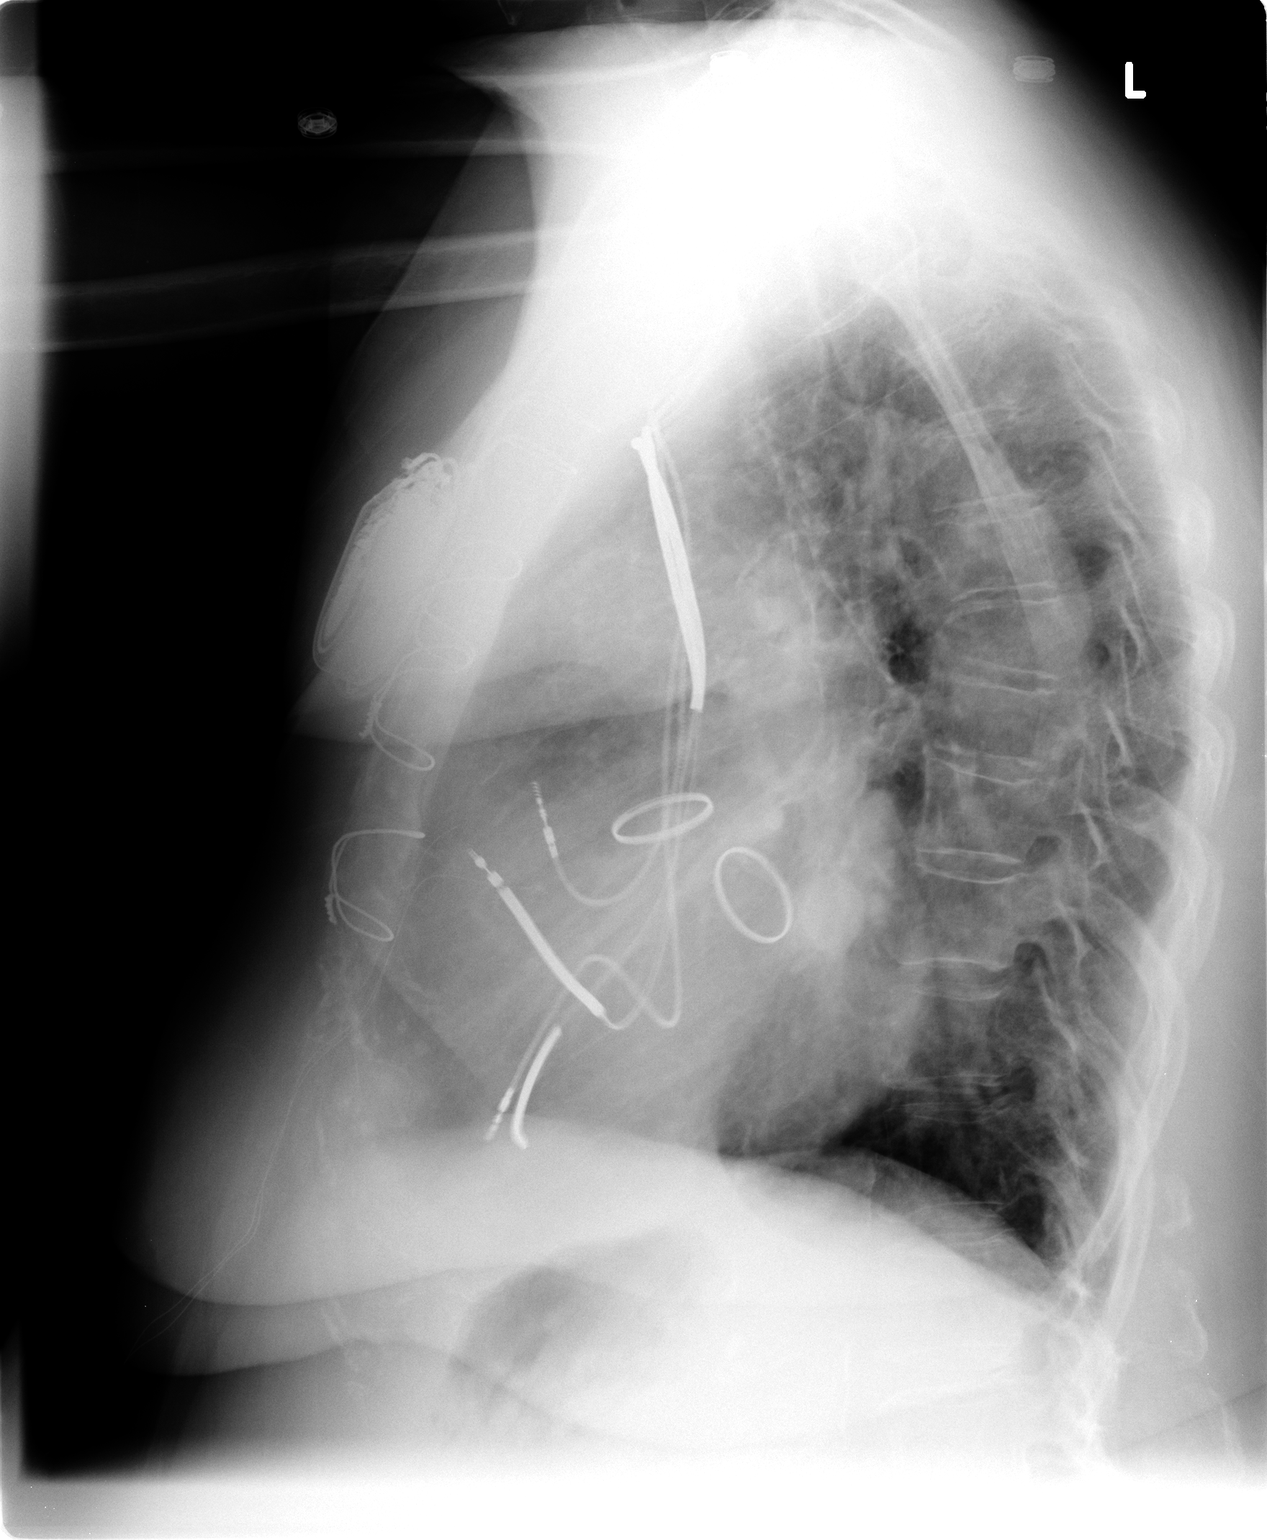

[2 of 2 positions shown; findings below may reference images not displayed]

FINDINGS: Multiple lead AICD is in place with controller device on the left.
Previous median sternotomy and valvular replacement procedures have
been performed.  There is stable moderate enlargement of the
cardiac silhouette. Ectasia and nonaneurysmal calcification of the
thoracic aorta is seen.

There is slight hyperinflation configuration with slight flattening
of the diaphragm on lateral image.  There is central peribronchial
thickening.  No airspace disease or pleural effusion is seen.
There is osteopenic appearance of the bones with degenerative
spondylosis.  Slight compression of the upper mid thoracic
vertebral bodies chronic and stable.
IMPRESSION: Stable moderate enlargement of the cardiac silhouette.  AICD in
place.  Slight hyperinflation configuration with central
peribronchial thickening.  No airspace disease or pleural effusion.

## 2011-04-17 IMAGING — US US RENAL
1 series · 14 of 25 positions shown · non-contrast
Comparison: 02/15/2005

CLINICAL DATA: Dehydration, acute renal insufficiency

RENAL/URINARY TRACT ULTRASOUND COMPLETE

[Series 1: us renal · 0.27mm/px · 14 of 25 slices shown]
[im 1/25]
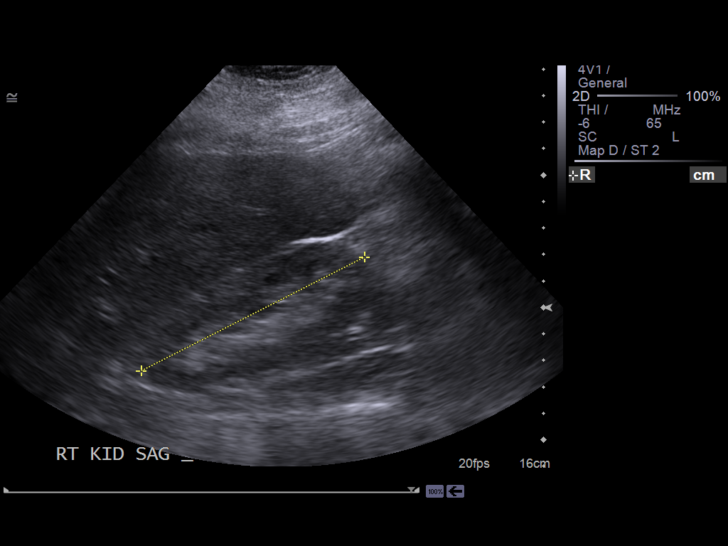
[im 3/25]
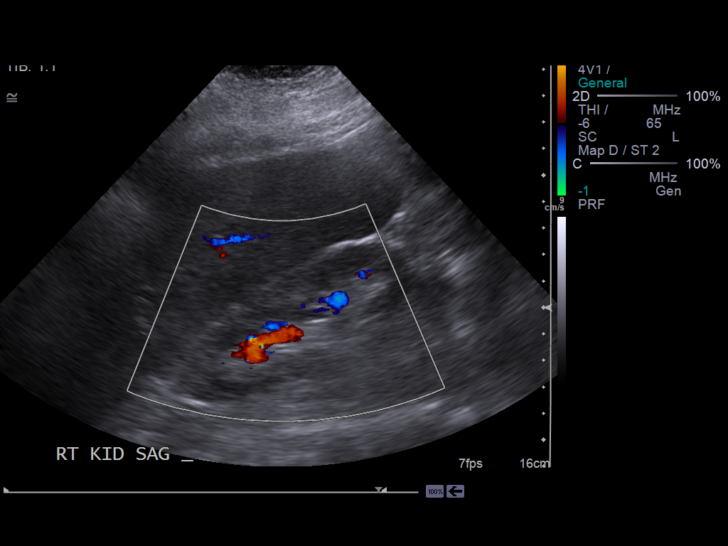
[im 5/25]
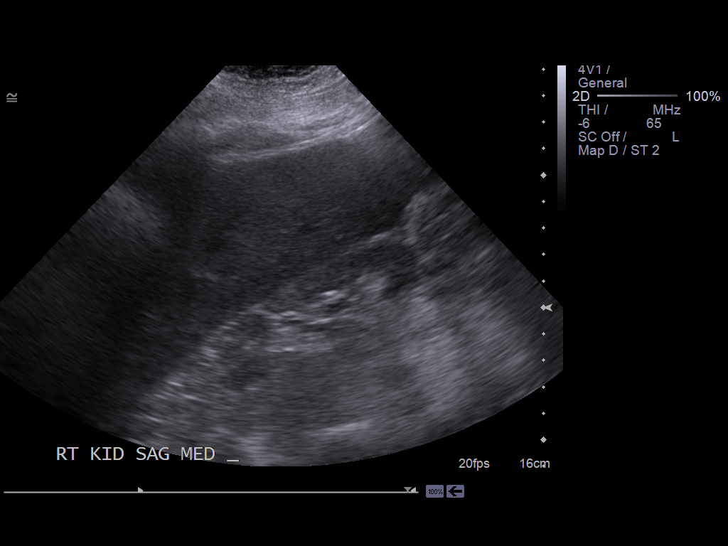
[im 7/25]
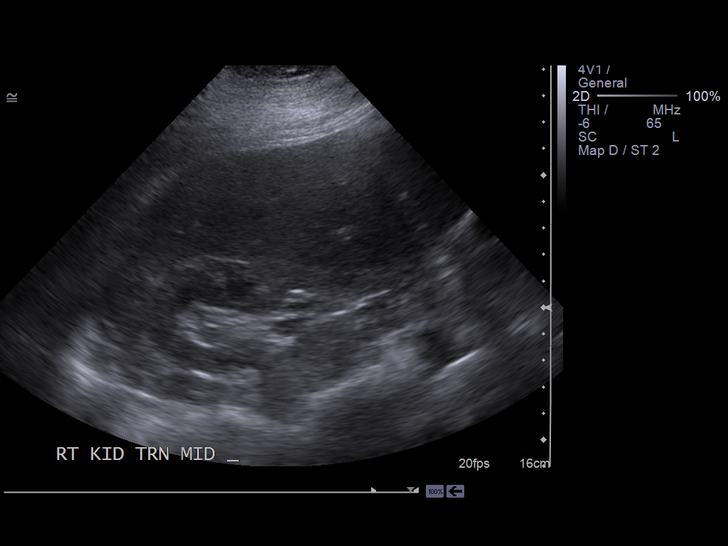
[im 9/25]
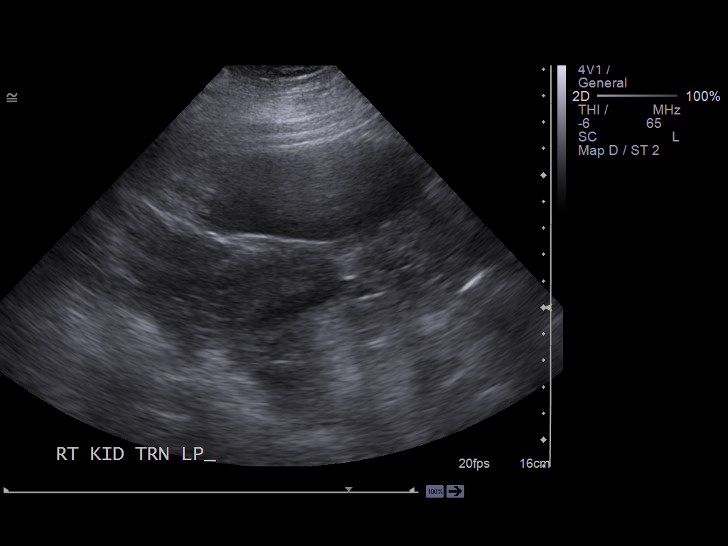
[im 10/25]
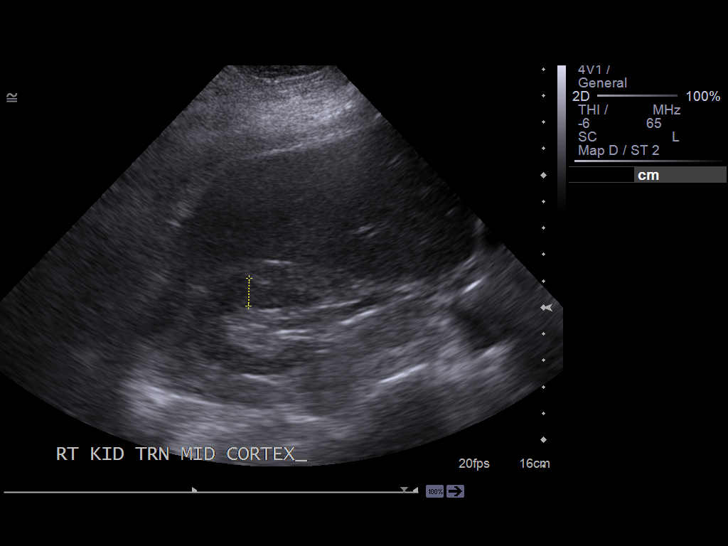
[im 12/25]
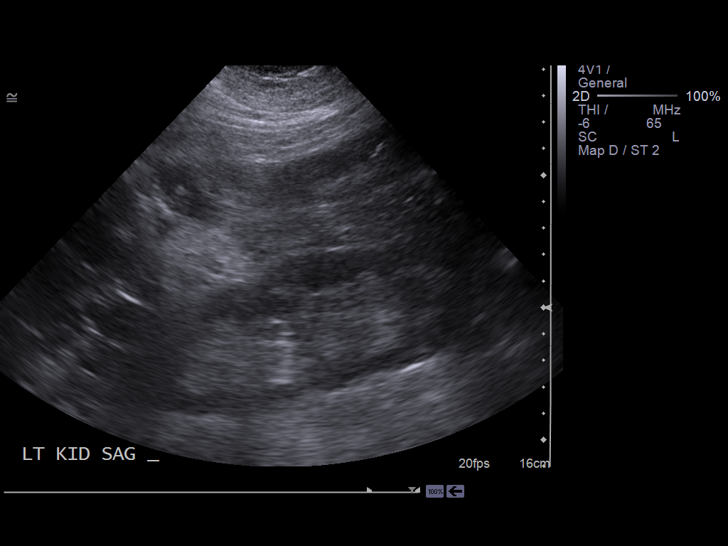
[im 14/25]
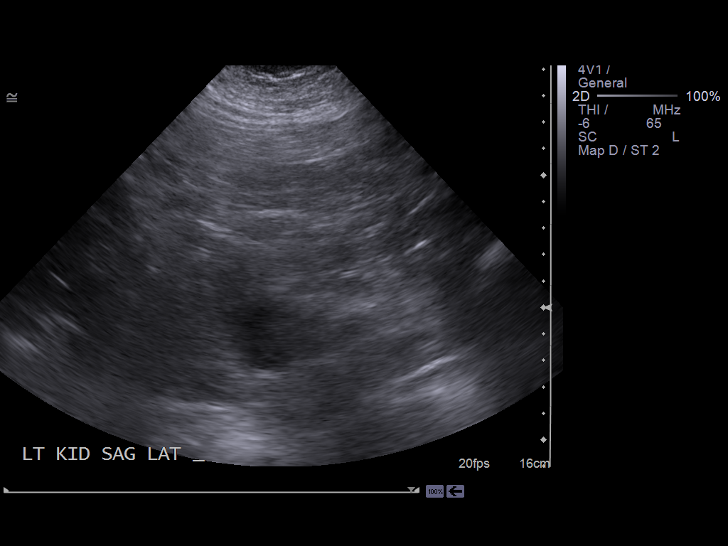
[im 16/25]
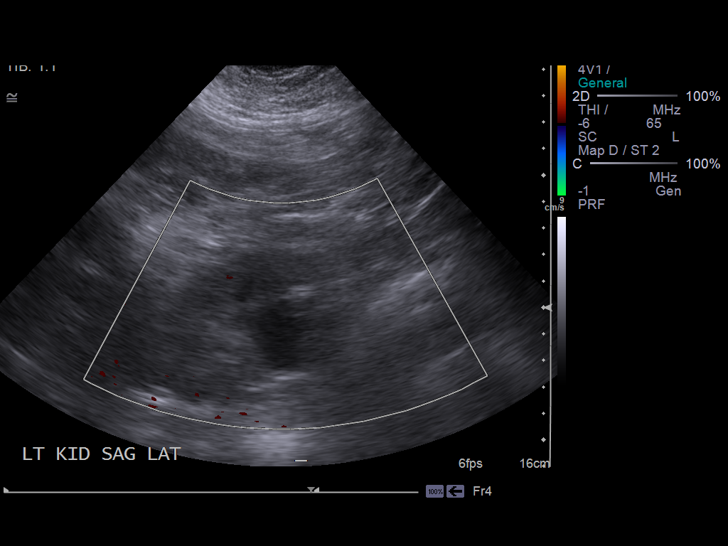
[im 17/25]
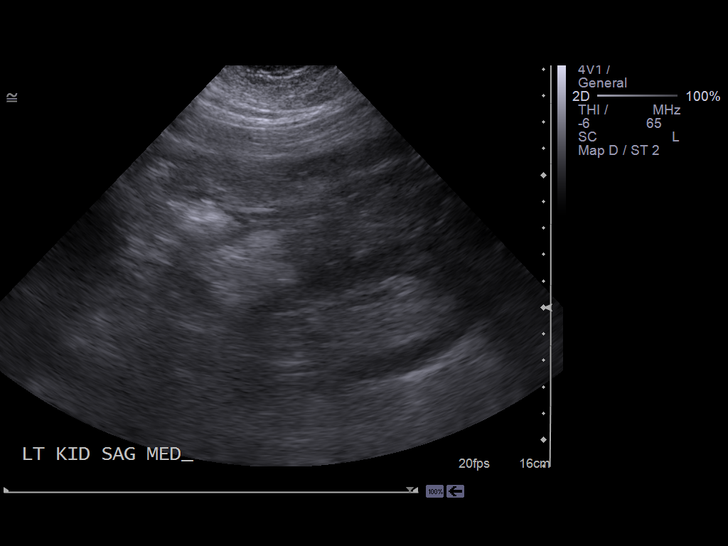
[im 19/25]
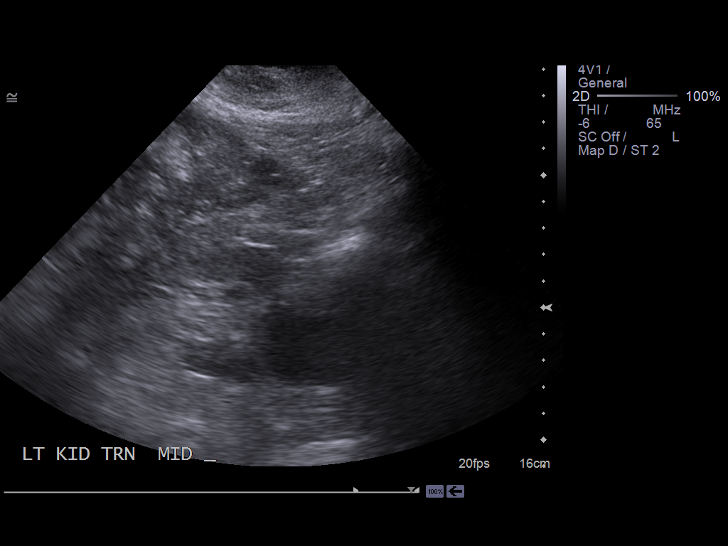
[im 21/25]
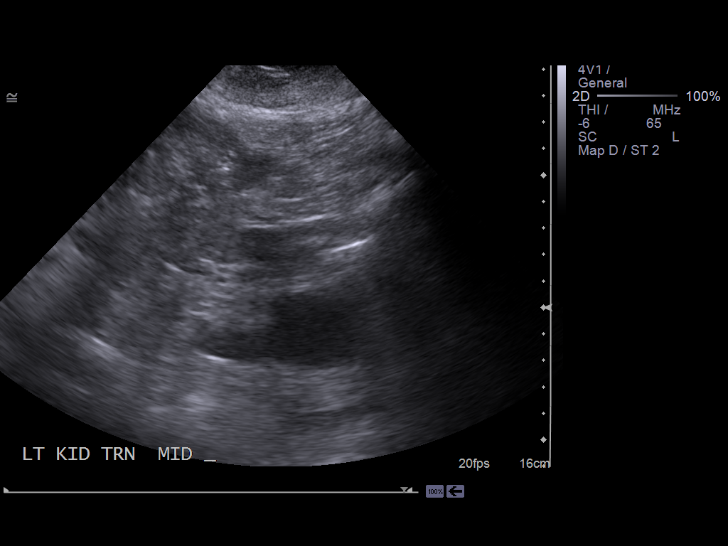
[im 23/25]
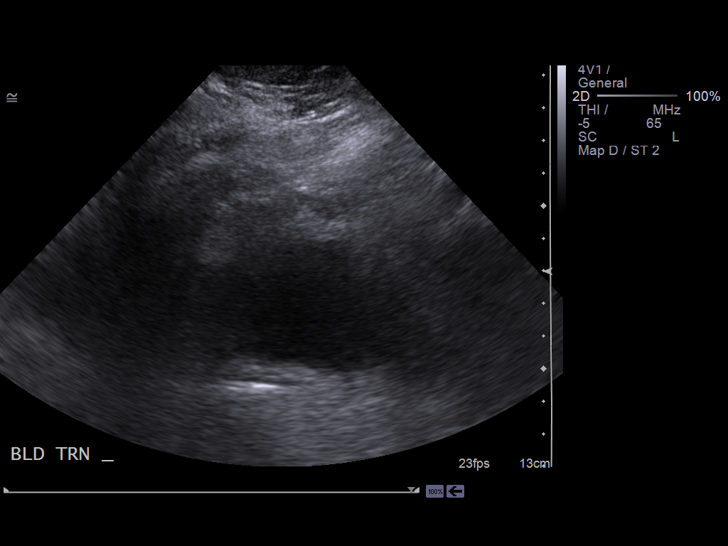
[im 25/25]
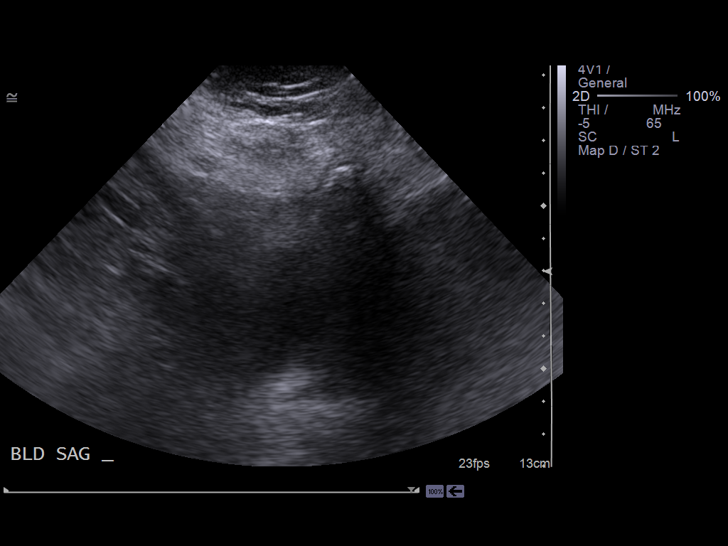

[14 of 25 positions shown; findings below may reference images not displayed]

FINDINGS: Right Kidney:  Normal in size and parenchymal echogenicity.  No
mass or hydronephrosis is seen.

Left Kidney:  Normal in size and parenchymal echogenicity.  There
is a 3.1 x 8-0.9 x 3.0 cm simple cyst from the posterior polar
region of the left kidney.  No hydronephrosis is seen. .

Bladder:  Appears normal for degree of bladder distention.
IMPRESSION: Left renal cyst.  No hydronephrosis.

## 2011-04-18 IMAGING — US US ABDOMEN COMPLETE
1 series · 13 of 25 positions shown · non-contrast
Comparison: None.

CLINICAL DATA: Dehydration and acute renal insufficiency

COMPLETE ABDOMINAL ULTRASOUND

[Series 1: us abdomen complete · 0.35mm/px · 13 of 98 slices shown]
[im 1/98]
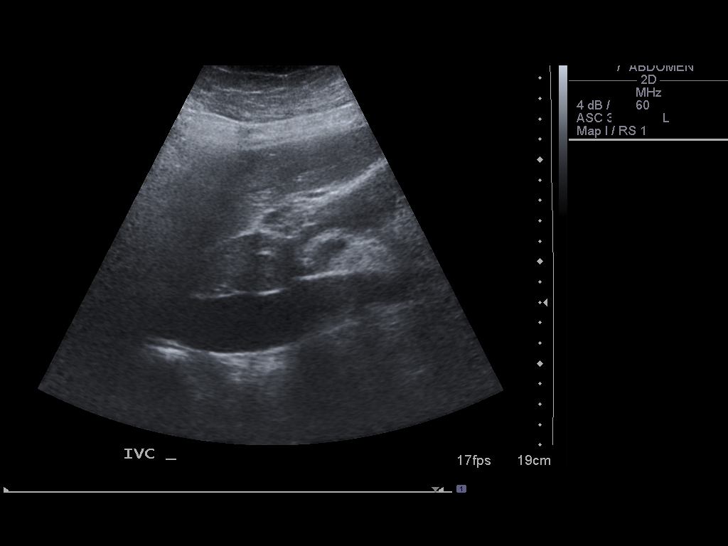
[im 9/98]
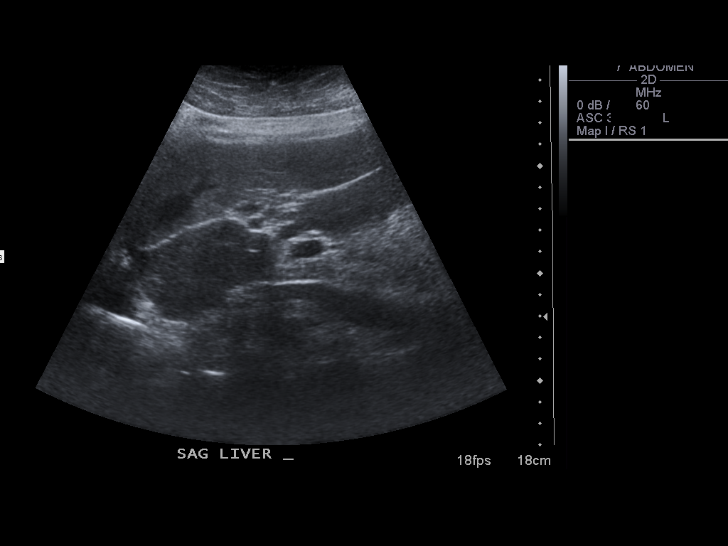
[im 17/98]
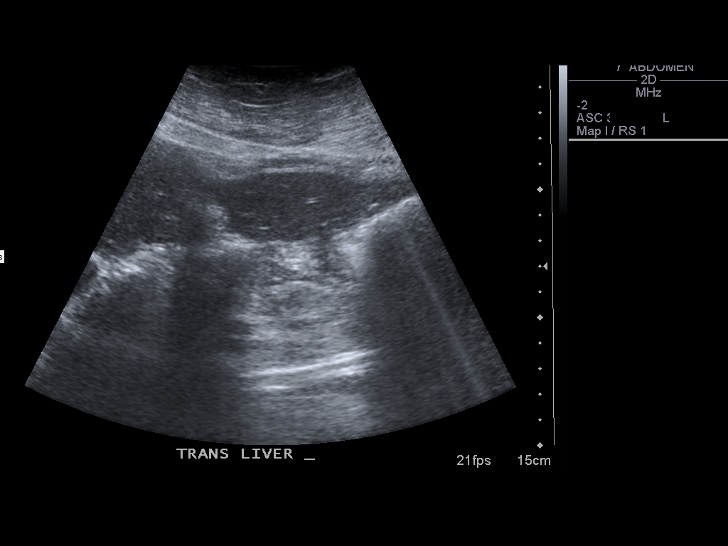
[im 25/98]
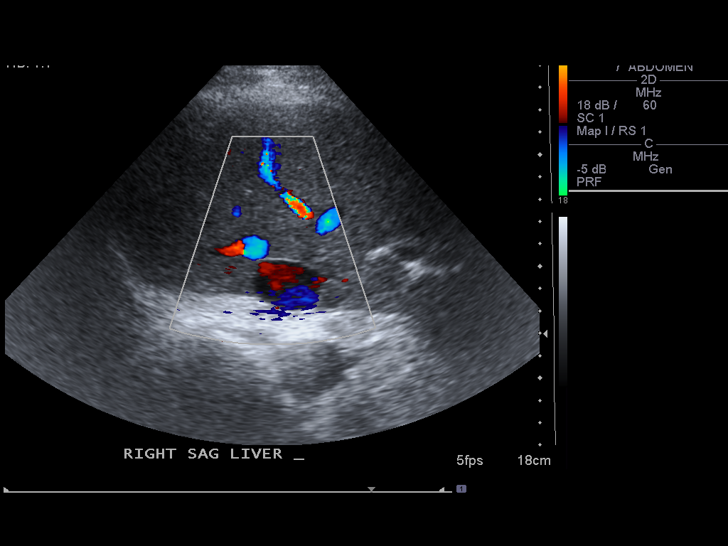
[im 33/98]
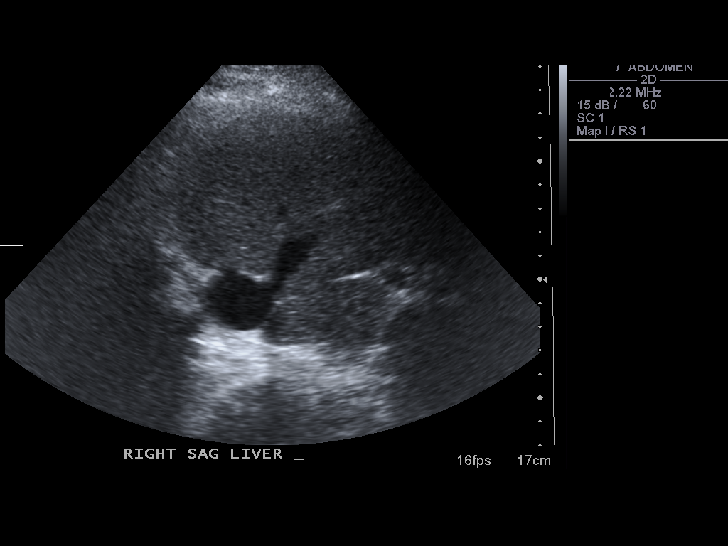
[im 41/98]
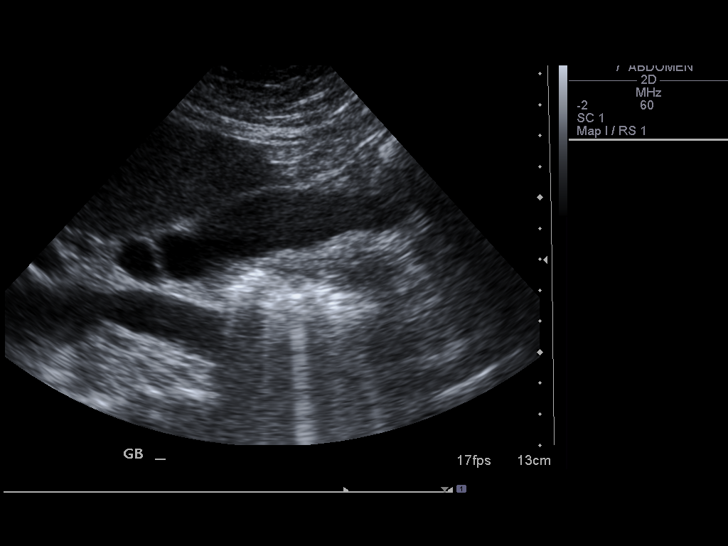
[im 49/98]
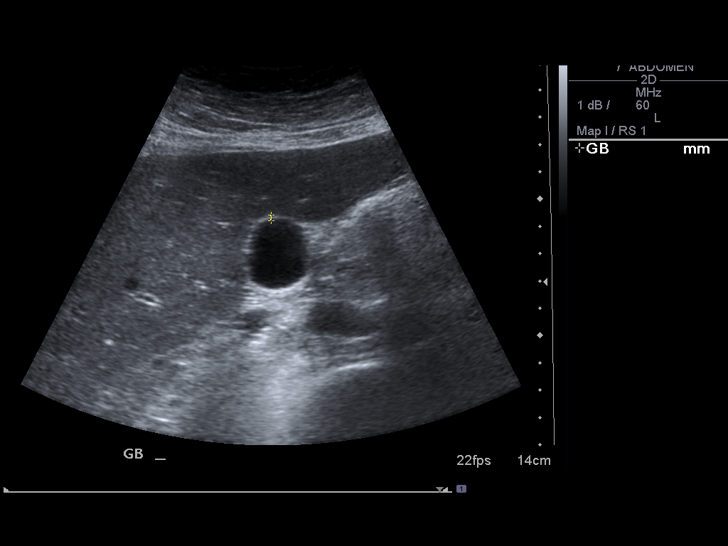
[im 57/98]
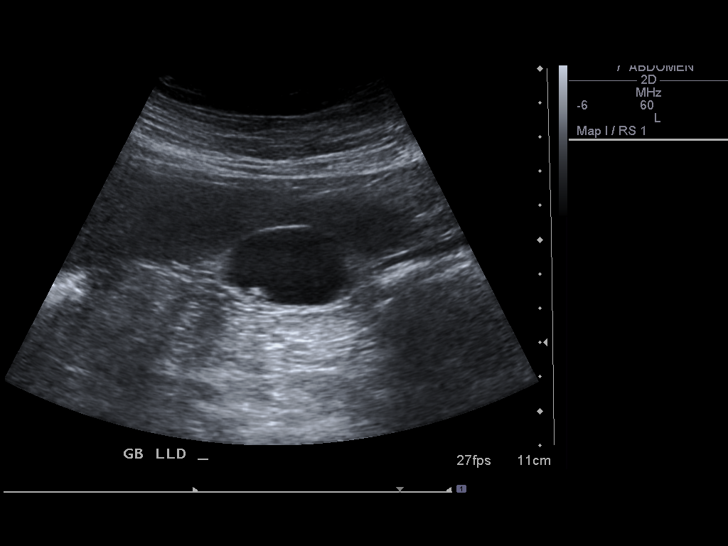
[im 65/98]
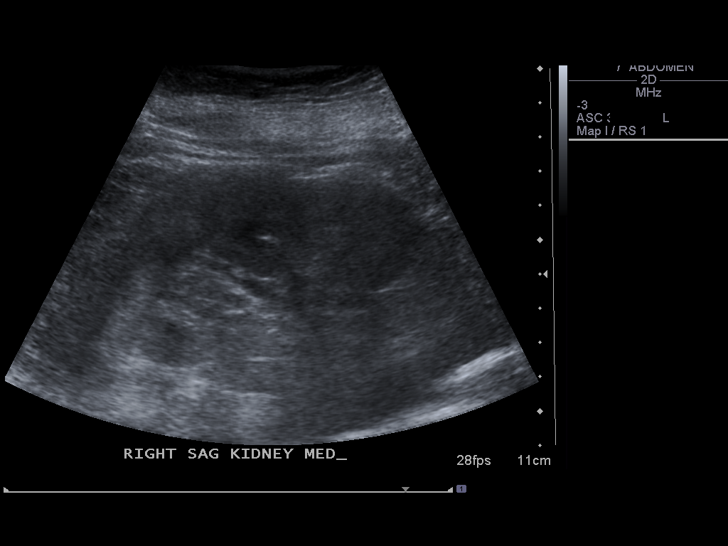
[im 73/98]
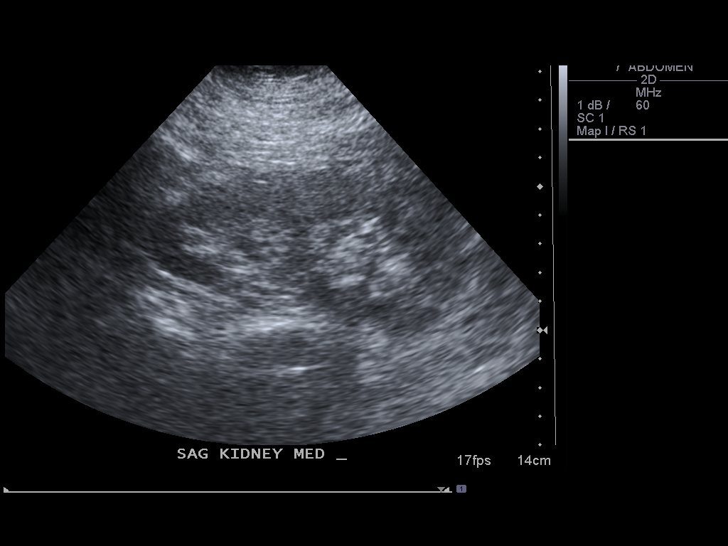
[im 81/98]
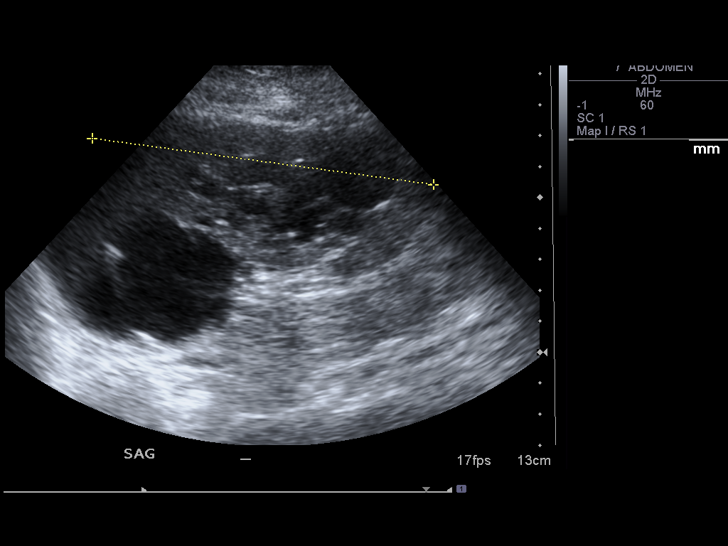
[im 89/98]
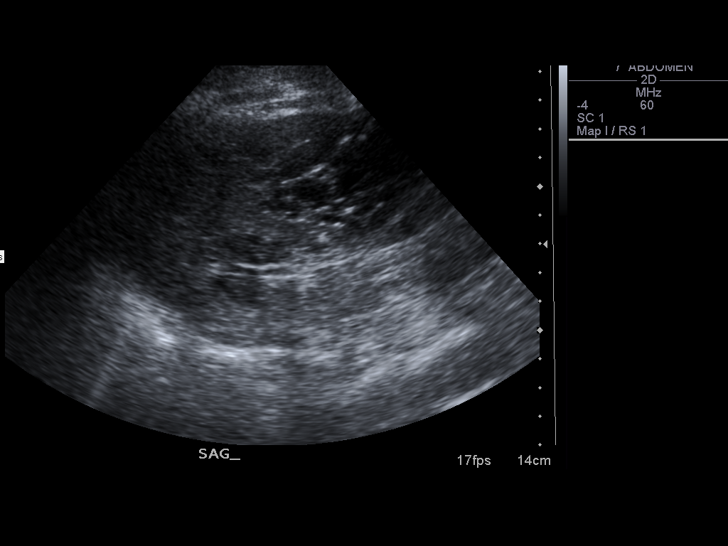
[im 98/98]
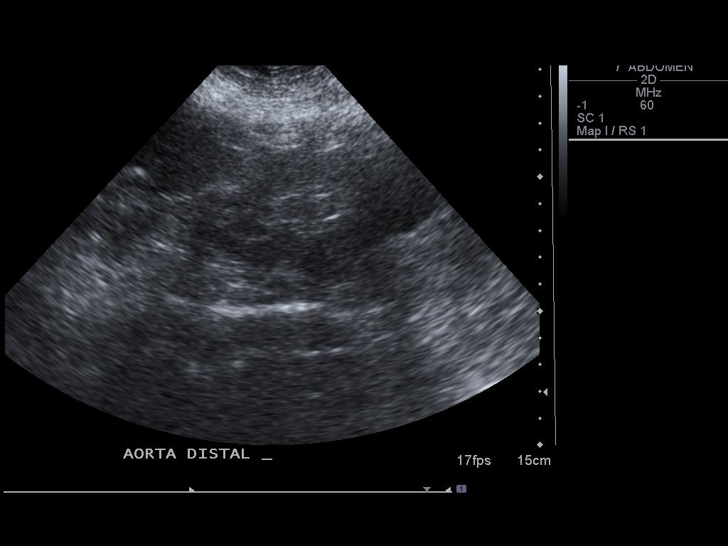

[13 of 25 positions shown; findings below may reference images not displayed]

FINDINGS: Gallbladder:  No multiple small stones are seen in the gallbladder
lumen.  There is no gallbladder wall thickening or pericholecystic
fluid.  No sonographic Murphy's sign is present.

Common bile duct:  Is normal in caliber measuring 4.1 mm

Liver:  No focal lesion identified.  Within normal limits in
parenchymal echogenicity.

IVC:  Appears normal.

Pancreas:  No focal abnormality seen.

Spleen:  Measures 11.9 cm.  Several cystic lesions are again seen
throughout the splenic parenchyma similar compared to CT dated
01/27/2008.

Right Kidney:  Measures 10.7 cm in length.  No focal mass or
hydronephrosis is seen.

Left Kidney:  Measures 11.7 cm.  There is a 3.0 x 2.8 x 3.4 cm cyst
in the interpolar region.  There is no hydronephrosis.

Abdominal aorta:  No aneurysm identified in the mid or upper aorta.
The distal aorta is not visualized secondary overlying bowel gas.
IMPRESSION: 1.  Cholelithiasis without evidence of acute cholecystitis.
2.  Unchanged left renal cyst.  No hydronephrosis.
3.  Unchanged cystic lesions in the splenic parenchyma.

## 2011-05-02 ENCOUNTER — Other Ambulatory Visit (HOSPITAL_COMMUNITY): Payer: Medicare Other

## 2011-05-05 LAB — COMPLETE METABOLIC PANEL WITH GFR
ALT: 9 U/L (ref 0–35)
AST: 20 U/L (ref 0–37)
Albumin: 4 g/dL (ref 3.5–5.2)
Alkaline Phosphatase: 31 U/L — ABNORMAL LOW (ref 39–117)
BUN: 40 mg/dL — ABNORMAL HIGH (ref 6–23)
CO2: 30 mEq/L (ref 19–32)
Calcium: 9.9 mg/dL (ref 8.4–10.5)
Chloride: 100 mEq/L (ref 96–112)
Creat: 1.83 mg/dL — ABNORMAL HIGH (ref 0.50–1.10)
GFR, Est African American: 32 mL/min — ABNORMAL LOW (ref >60)
GFR, Est Non African American: 27 mL/min — ABNORMAL LOW (ref >60)
Glucose, Bld: 92 mg/dL (ref 70–99)
Potassium: 5.2 mEq/L (ref 3.5–5.3)
Sodium: 140 mEq/L (ref 135–145)
Total Bilirubin: 0.3 mg/dL (ref 0.3–1.2)
Total Protein: 7.3 g/dL (ref 6.0–8.3)

## 2011-05-05 LAB — LIPID PANEL
Cholesterol: 107 mg/dL (ref 0–200)
HDL: 38 mg/dL — ABNORMAL LOW (ref >39)
LDL Cholesterol: 49 mg/dL (ref 0–99)
Total CHOL/HDL Ratio: 2.8 Ratio
Triglycerides: 101 mg/dL (ref <150)
VLDL: 20 mg/dL (ref 0–40)

## 2011-05-05 LAB — HEMOGLOBIN A1C
Hgb A1c MFr Bld: 6.9 % — ABNORMAL HIGH (ref <5.7)
Mean Plasma Glucose: 151 mg/dL — ABNORMAL HIGH (ref <117)

## 2011-05-05 LAB — TSH: TSH: 1.597 u[IU]/mL (ref 0.350–4.500)

## 2011-05-09 ENCOUNTER — Ambulatory Visit (HOSPITAL_COMMUNITY): Payer: Medicare Other | Admitting: Oncology

## 2011-05-15 ENCOUNTER — Telehealth: Payer: Self-pay | Admitting: Family Medicine

## 2011-05-15 NOTE — Telephone Encounter (Signed)
pls document her symptoms,let me know

## 2011-05-15 NOTE — Telephone Encounter (Signed)
Is this urine with odor ? is she itching?, if urine has odor needs CCUA  Complaint is a bit confusing to me

## 2011-05-15 NOTE — Telephone Encounter (Signed)
Some odor, some irritation, no discharge

## 2011-05-16 NOTE — Telephone Encounter (Signed)
Called patient, left message.

## 2011-05-17 ENCOUNTER — Other Ambulatory Visit: Payer: Self-pay

## 2011-05-17 MED ORDER — LEVOTHYROXINE SODIUM 75 MCG PO TABS: 75.0000 ug | ORAL_TABLET | Freq: Every day | ORAL | Status: DC

## 2011-05-17 MED ORDER — POTASSIUM CHLORIDE CRYS ER 20 MEQ PO TBCR: EXTENDED_RELEASE_TABLET | ORAL | Status: DC

## 2011-05-17 NOTE — Telephone Encounter (Signed)
Also wants refill on coumadin. I advised her to get whoever checks her coumadin to fill but she said you have always filled it for her

## 2011-05-17 NOTE — Telephone Encounter (Signed)
Check if this really so by verifyuing with the pharmacy if so, pls continue as before, then the cardiologist will direct, ASSUMING the direction states as directed, pls let me know what you find out

## 2011-05-17 NOTE — Telephone Encounter (Signed)
pls erx fluconazole 150mg  #1 only and let her know

## 2011-05-17 NOTE — Telephone Encounter (Signed)
No urinary symptoms, just some odor in vaginal area itch and irritation. Just wants a diflucan

## 2011-05-18 ENCOUNTER — Telehealth: Payer: Self-pay | Admitting: Family Medicine

## 2011-05-18 ENCOUNTER — Other Ambulatory Visit: Payer: Self-pay

## 2011-05-18 MED ORDER — FLUCONAZOLE 150 MG PO TABS: 150.0000 mg | ORAL_TABLET | Freq: Once | ORAL | Status: DC

## 2011-05-18 MED ORDER — WARFARIN SODIUM 5 MG PO TABS: 5.0000 mg | ORAL_TABLET | Freq: Three times a day (TID) | ORAL | Status: DC

## 2011-05-18 NOTE — Telephone Encounter (Signed)
Since we were the last ones to fill it I refilled it to her mail order

## 2011-05-18 NOTE — Telephone Encounter (Signed)
Called into [facility]

## 2011-05-25 ENCOUNTER — Other Ambulatory Visit: Payer: Self-pay | Admitting: Family Medicine

## 2011-06-05 ENCOUNTER — Encounter (HOSPITAL_COMMUNITY): Payer: Medicare Other | Attending: Oncology

## 2011-06-05 DIAGNOSIS — D638 Anemia in other chronic diseases classified elsewhere: Secondary | ICD-10-CM | POA: Insufficient documentation

## 2011-06-05 DIAGNOSIS — E119 Type 2 diabetes mellitus without complications: Secondary | ICD-10-CM | POA: Insufficient documentation

## 2011-06-05 DIAGNOSIS — Z954 Presence of other heart-valve replacement: Secondary | ICD-10-CM | POA: Insufficient documentation

## 2011-06-05 DIAGNOSIS — D649 Anemia, unspecified: Secondary | ICD-10-CM

## 2011-06-05 DIAGNOSIS — D599 Acquired hemolytic anemia, unspecified: Secondary | ICD-10-CM | POA: Insufficient documentation

## 2011-06-05 LAB — CBC
HCT: 32.3 % — ABNORMAL LOW (ref 36.0–46.0)
Hemoglobin: 10.3 g/dL — ABNORMAL LOW (ref 12.0–15.0)
MCH: 28.6 pg (ref 26.0–34.0)
MCHC: 31.9 g/dL (ref 30.0–36.0)
MCV: 89.7 fL (ref 78.0–100.0)
Platelets: 283 10*3/uL (ref 150–400)
RBC: 3.6 MIL/uL — ABNORMAL LOW (ref 3.87–5.11)
RDW: 15.3 % (ref 11.5–15.5)
WBC: 7.2 10*3/uL (ref 4.0–10.5)

## 2011-06-05 LAB — IRON AND TIBC
Iron: 91 ug/dL (ref 42–135)
Saturation Ratios: 21 % (ref 20–55)
TIBC: 435 ug/dL (ref 250–470)
UIBC: 344 ug/dL (ref 125–400)

## 2011-06-05 LAB — RETICULOCYTES
RBC.: 3.6 MIL/uL — ABNORMAL LOW (ref 3.87–5.11)
Retic Count, Absolute: 82.8 10*3/uL (ref 19.0–186.0)
Retic Ct Pct: 2.3 % (ref 0.4–3.1)

## 2011-06-05 LAB — FERRITIN: Ferritin: 448 ng/mL — ABNORMAL HIGH (ref 10–291)

## 2011-06-05 NOTE — Progress Notes (Signed)
Labs drawn today for cbc,retic,feibc,ferr

## 2011-06-16 ENCOUNTER — Encounter: Payer: Self-pay | Admitting: Family Medicine

## 2011-06-16 LAB — COMPREHENSIVE METABOLIC PANEL
ALT: 13
AST: 21
Albumin: 3.6
Alkaline Phosphatase: 45
BUN: 24 — ABNORMAL HIGH
CO2: 31
Calcium: 9.6
Chloride: 101
Creatinine, Ser: 1.35 — ABNORMAL HIGH
GFR calc Af Amer: 47 — ABNORMAL LOW
GFR calc non Af Amer: 38 — ABNORMAL LOW
Glucose, Bld: 65 — ABNORMAL LOW
Potassium: 4
Sodium: 135
Total Bilirubin: 0.6
Total Protein: 7.9

## 2011-06-16 LAB — CBC
HCT: 31.7 — ABNORMAL LOW
Hemoglobin: 10.6 — ABNORMAL LOW
MCHC: 33.5
MCV: 84.9
Platelets: 234
RBC: 3.73 — ABNORMAL LOW
RDW: 16.1 — ABNORMAL HIGH
WBC: 8.2

## 2011-06-16 LAB — RETICULOCYTES
RBC.: 3.73 — ABNORMAL LOW
Retic Count, Absolute: 104.4
Retic Ct Pct: 2.8

## 2011-06-16 LAB — LACTATE DEHYDROGENASE: LDH: 227

## 2011-06-16 LAB — HAPTOGLOBIN: Haptoglobin: <6 — ABNORMAL LOW

## 2011-06-19 ENCOUNTER — Encounter: Payer: Self-pay | Admitting: Family Medicine

## 2011-06-19 ENCOUNTER — Ambulatory Visit (INDEPENDENT_AMBULATORY_CARE_PROVIDER_SITE_OTHER): Payer: Medicare Other | Admitting: Family Medicine

## 2011-06-19 VITALS — BP 120/68 | HR 91 | Resp 16 | Ht 68.0 in | Wt 242.8 lb

## 2011-06-19 DIAGNOSIS — E785 Hyperlipidemia, unspecified: Secondary | ICD-10-CM

## 2011-06-19 DIAGNOSIS — M479 Spondylosis, unspecified: Secondary | ICD-10-CM

## 2011-06-19 DIAGNOSIS — Z23 Encounter for immunization: Secondary | ICD-10-CM

## 2011-06-19 DIAGNOSIS — I10 Essential (primary) hypertension: Secondary | ICD-10-CM

## 2011-06-19 DIAGNOSIS — K8020 Calculus of gallbladder without cholecystitis without obstruction: Secondary | ICD-10-CM

## 2011-06-19 DIAGNOSIS — M25559 Pain in unspecified hip: Secondary | ICD-10-CM

## 2011-06-19 DIAGNOSIS — E119 Type 2 diabetes mellitus without complications: Secondary | ICD-10-CM

## 2011-06-19 DIAGNOSIS — E039 Hypothyroidism, unspecified: Secondary | ICD-10-CM

## 2011-06-19 DIAGNOSIS — E669 Obesity, unspecified: Secondary | ICD-10-CM

## 2011-06-19 MED ORDER — FLUCONAZOLE 150 MG PO TABS: 150.0000 mg | ORAL_TABLET | Freq: Once | ORAL | Status: AC

## 2011-06-19 NOTE — Assessment & Plan Note (Signed)
Controlled, no change in medication  

## 2011-06-19 NOTE — Assessment & Plan Note (Signed)
Deteriorated PT referral

## 2011-06-19 NOTE — Assessment & Plan Note (Signed)
Deteriorated, some pain  Coming from back referred to PT

## 2011-06-19 NOTE — Patient Instructions (Addendum)
F/u early January.  Flu vaccine today.  Please call your insurance company to see if the shingles vaccine is covered, we have, and I recommend it  Fasting labs in January, before next visit.  LABWORK  NEEDS TO BE DONE BETWEEN 3 TO 7 DAYS BEFORE YOUR NEXT SCEDULED  VISIT.  THIS WILL IMPROVE THE QUALITY OF YOUR CARE.  You will be referred for therapy for chronic back pain  You need to go to the diabetic class at the hospital, please call and register

## 2011-06-19 NOTE — Progress Notes (Signed)
  Subjective:    Patient ID: [name] [name], female    DOB: 1932-06-03, 77 y.o.   MRN: [id number]  HPI HYPERTENSION  Disease Monitoring  Blood pressure range-[place] Chest pain- no      Dyspnea- no  Compliance- good Lightheadedness- no   Edema- no  DIABETES  Disease Monitoring  Blood Sugar ranges-fasting range 90 to 120 Polyuria- no New Visual problems- no, but wants to see new opthamologistl next year  Compliance- good Hypoglycemic symptoms- no  Last eye exam: 04/2011      Podiatry visit: every 6 to 10 weeks  HYPERLIPIDEMIA  Compliance- good RUQ pain- no  Muscle aches- no   REGULAR EXERCISE-3 times weekly  Wants referral for chronic back pain and right hip pain to physical therapy   Review of Systems See HPI Denies recent fever or chills. Denies sinus pressure, nasal congestion, ear pain or sore throat. Denies chest congestion, productive cough or wheezing. Denies chest pains, palpitations and leg swelling Denies   vomiting,diarrhea or constipation.  Reports bloating and nausea have lessened. Denies dysuria, frequency, hesitancy or incontinence.  Denies headaches, seizures, numbness, or tingling. Denies depression, anxiety or insomnia. Denies skin break down or rash.        Objective:   Physical Exam Patient alert and oriented and in no cardiopulmonary distress.  HEENT: No facial asymmetry, EOMI, no sinus tenderness,  oropharynx pink and moist.  Neck supple no adenopathy.  Chest: Clear to auscultation bilaterally.  CVS: S1, S2 no murmurs, no S3.  ABD: Soft non tender. Bowel sounds normal.  Ext: No edema  [id number]  ROM spine,  Right hip and knees.  Skin: Intact, no ulcerations or rash noted.  Psych: Good eye contact, normal affect. Memory intact not anxious or depressed appearing.  CNS: CN 2-12 intact, power, tone and sensation normal throughout.        Assessment & Plan:

## 2011-06-19 NOTE — Assessment & Plan Note (Signed)
Deteriorated. Patient re-educated about  the importance of commitment to a  minimum of 150 minutes of exercise per week. The importance of healthy food choices with portion control discussed. Encouraged to start a food diary, count calories and to consider  joining a support group. Sample diet sheets offered. Goals set by the patient for the next several months.    

## 2011-06-19 NOTE — Assessment & Plan Note (Signed)
Deteriorated HBA1C has increased, pt reports she will become more diligent with diet , which has got out of control

## 2011-06-19 NOTE — Assessment & Plan Note (Signed)
Reports reduced symptoms, high surgical risk thus watchful waiting only

## 2011-06-23 LAB — CBC
HCT: 29.8 — ABNORMAL LOW
Hemoglobin: 9.8 — ABNORMAL LOW
MCHC: 32.9
MCV: 86.6
Platelets: 218
RBC: 3.45 — ABNORMAL LOW
RDW: 16.1 — ABNORMAL HIGH
WBC: 7.5

## 2011-06-23 LAB — VITAMIN B12: Vitamin B-12: 392 (ref 211–911)

## 2011-06-23 LAB — COMPREHENSIVE METABOLIC PANEL
ALT: 13
AST: 24
Albumin: 3.7
Alkaline Phosphatase: 42
BUN: 18
CO2: 30
Calcium: 9.5
Chloride: 101
Creatinine, Ser: 1.21 — ABNORMAL HIGH
GFR calc Af Amer: 53 — ABNORMAL LOW
GFR calc non Af Amer: 44 — ABNORMAL LOW
Glucose, Bld: 93
Potassium: 4.1
Sodium: 136
Total Bilirubin: 0.7
Total Protein: 7.7

## 2011-06-23 LAB — FERRITIN: Ferritin: 353 — ABNORMAL HIGH (ref 10–291)

## 2011-06-23 LAB — RETICULOCYTES
RBC.: 3.45 — ABNORMAL LOW
Retic Count, Absolute: 62.1
Retic Ct Pct: 1.8

## 2011-06-23 LAB — FOLATE: Folate: >20

## 2011-06-23 LAB — LACTATE DEHYDROGENASE: LDH: 226

## 2011-06-23 LAB — HAPTOGLOBIN: Haptoglobin: <6 — ABNORMAL LOW

## 2011-06-29 ENCOUNTER — Ambulatory Visit (HOSPITAL_COMMUNITY)
Admission: RE | Admit: 2011-06-29 | Discharge: 2011-06-29 | Disposition: A | Discharge status: Home / Self Care | Payer: Medicare Other | Source: Ambulatory Visit | Attending: Family Medicine | Admitting: Family Medicine

## 2011-06-29 DIAGNOSIS — M6281 Muscle weakness (generalized): Secondary | ICD-10-CM | POA: Insufficient documentation

## 2011-06-29 DIAGNOSIS — M545 Low back pain, unspecified: Secondary | ICD-10-CM | POA: Insufficient documentation

## 2011-06-29 DIAGNOSIS — I10 Essential (primary) hypertension: Secondary | ICD-10-CM | POA: Insufficient documentation

## 2011-06-29 DIAGNOSIS — IMO0001 Reserved for inherently not codable concepts without codable children: Secondary | ICD-10-CM | POA: Insufficient documentation

## 2011-06-29 DIAGNOSIS — E119 Type 2 diabetes mellitus without complications: Secondary | ICD-10-CM | POA: Insufficient documentation

## 2011-06-29 NOTE — Progress Notes (Addendum)
Physical Therapy Evaluation  Patient Details  Name: [name] [name] MRN: [id number] Date of Birth: 1932-08-03  Today's Date: 06/29/2011 Time: [id number] Time Calculation (min): 55 min Charges: 1 eval, 10 min TE Visit#: 1  of 8   Re-eval: 07/29/11    Past Medical History:  Past Medical History  Diagnosis Date  . DJD (degenerative joint disease) of lumbar spine   . Chronic kidney disease, stage I   . Esophageal reflux   . Other and unspecified hyperlipidemia   . Other primary cardiomyopathies   . Obesity, unspecified   . Unspecified hypothyroidism   . Type I (juvenile type) diabetes mellitus without mention of complication, not stated as uncontrolled   . Essential hypertension, benign   . 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 Pain in joint, pelvic region and thigh   . Insomnia, unspecified    Past Surgical History:  Past Surgical History  Procedure Date  . Thyroidectomy   . Pacemaker placed 2004  . Aortic and mitral valve replacement 2001  . Right breast cyst removed     benign   . Right cataract removed     2005  . Defibrillator implanted 2006     Subjective Symptoms/Limitations Symptoms: Pt is a 77 y.o female referred to PT secondary to low back pain with radicular pain to bilateral LE (L>R) which started a few months ago. She reports that the morning is the worst time of her day and she is bent over in the morning and she does a few exercises to help with the pain and during the day she reports she improves.  Has difficulty maintain appropriate posture when walking.  Limitations: Sitting;Standing;Walking How long can you sit comfortably?: 1 hour;  Sit in a car for 30 minutes and unable to take long trips  How long can you stand comfortably?: 5 minutes  How long can you walk comfortably?: 10 minutes  Pain Assessment Currently in Pain?: Yes Pain Score:   7 Pain Location: Back Pain Radiating Towards: BLE Pain Onset: Other (comment)  (Chronic) Pain Frequency: Intermittent Pain Relieving Factors: Difficulty with siiting in a car, standing and walking  POSTURE: Significant slouching when sitting in chair.  Leans to the R when sitting in her chair.  Unable to maintain comfortable position.  OBSERVATION: Leg length discrepancy noted with RLE longer than L in supine and same leg length with supine to sit. Notable tightness to BLE: quads, hamstrings, hip flexors, gastroc and soleus.  Decreased endurance with low level activities. Functional Scale:  Oswestry: 58% Sensation/Coordination/Flexibility Flexibility Hoovers: Positive Thomas: Positive Obers: Positive 90/90: Positive  Assessment RLE Strength Right Hip Flexion: 3+/5 Right Hip Extension: 2+/5 Right Hip ABduction: 4/5 Right Hip ADduction: 4/5 Right Knee Flexion: 3+/5 Right Knee Extension: 3+/5 Right Ankle Dorsiflexion: 4/5 Right Ankle Plantar Flexion: 3/5 Right Ankle Inversion: 3/5 Right Ankle Eversion: 3/5 LLE Strength Left Hip Flexion: 3+/5 Left Hip Extension: 2+/5 Left Hip ABduction: 4/5 Left Hip ADduction: 2+/5 Left Knee Flexion: 4/5 Left Knee Extension: 3+/5 Left Ankle Dorsiflexion: 4/5 Left Ankle Plantar Flexion: 3/5 Left Ankle Inversion: 3/5 Left Ankle Eversion: 3/5 Lumbar AROM Overall Lumbar AROM: Within functional limits for tasks performed Lumbar Strength Lumbar Flexion: 2/5 Lumbar Extension: 2/5 NEUROLOGIC: Able to heel and toe walk GAIT: Antalgic  PALPATION: Could only reproduce pain to low back able to reproduce L hip pain to TFL and gluteus medius region.  Special Test:  -Bil SLR, -Bil ASLR, + SI Compression, - L hip scour, +  BLE 90/90 HS, + BLE Thomas, + BLE Ober's Exercise/Treatments Stretches Prone on Elbows Stretch: 1 rep;60 seconds ITB Stretch: 30 seconds;Limitations ITB Stretch Limitations: LLE only   Stability Straight Leg Raise: Prone;10 reps;Limitations Straight Leg Raises Limitations: BLE w. knee bent Heel Squeeze: 5  reps;5 seconds  Physical Therapy Assessment and Plan PT Assessment and Plan Clinical Impression Statement: Pt is a 77 y.o. female  referred to PT for LBP.  After examination it was found that the patient has current body structure impairments including: increased pain with intermittent radicular pain to BLE, significant gluteal and L hip spasms, decreased core and LE strength, decreased flexibility, leg length discrepancy, decreased activity tolerance and impaired posture, impaired balance and decreased perceived functional ability which are limiting her ability to participate in community and household activities and functions. Pt will benefit from skilled PT service to address the above body structure impairments in order to maximize function in order to improve quality of life. Rehab Potential: Good Clinical Impairments Affecting Rehab Potential: : increased pain with intermittent radicular pain to BLE, significant gluteal and L hip spasms, decreased core and LE strength, decreased flexibility, leg length discrepancy, decreased activity tolerance and impaired posture, impaired balance and decreased perceived functional ability  PT Frequency: Min 2X/week PT Duration: 4 weeks PT Treatment/Interventions: Functional mobility training;Therapeutic exercise;Balance training;Patient/family education;Other (comment) (Manual techniques and modalities for pain control) PT Plan: Review HEP: Bridging, prone on elbows, heel squezze, prone SLR.  Add Ab set, clams, iso hip flexion, LTR, ITB stretch, HS stretch    Goals Home Exercise Program Pt will Perform Home Exercise Program: Independently PT Short Term Goals Time to Complete Short Term Goals: 2 weeks PT Short Term Goal 1: Pt will report pain less than 4/10 for 50% of her day while standing PT Short Term Goal 2: Pt will report decrease in radicular symptoms to 10% for 5 days in a row.  PT Short Term Goal 3: Pt will increase LE and core strength by 1 muscle  grade. PT Short Term Goal 4: Pt will demonstrate L and R SLS x 15 sec on static surface.  PT Long Term Goals Time to Complete Long Term Goals: 4 weeks PT Long Term Goal 1: Pt will report pain less than or equal to 3/10 for 75% of her day for improved quality of life. PT Long Term Goal 2: Pt will report cessation of radicular pain to BLE.  Long Term Goal 3: Pt will improve LE strength to WNL order to ambulate for 30 minutes in order to shop at the grocery store. Long Term Goal 4: Pt will improve core strength to WNL in order to comfortably stand for  30 minutes in order to prepare a meal.  PT Long Term Goal 5: Pt will score less than 48% on Oswestry for improved perceived functional ability.   Problem List Patient Active Problem List  Diagnoses  . HYPOTHYROIDISM  . DIABETES MELLITUS, TYPE II  . HYPERLIPIDEMIA  . DEHYDRATION  . OBESITY  . HYPERTENSION  . CARDIOMYOPATHY  . UNSPECIFIED CARDIOVASCULAR DISEASE  . VARICOSE VEINS LOWER EXTREMITIES W/OTH COMPS  . SINUSITIS, ACUTE  . GERD  . CHOLELITHIASIS  . CHRONIC KIDNEY DISEASE STAGE I  . ACUTE CYSTITIS  . OTHER SIGN AND SYMPTOM IN BREAST  . HIP PAIN, RIGHT  . DEGENERATIVE JOINT DISEASE, SPINE  . INSOMNIA  . FATIGUE  . NAUSEA  . NECK SPASM  . HEART VALVE REPLACED BY OTHER MEANS  . HEADACHE  . Allergic  rhinitis  . Low back pain    PT - End of Session Activity Tolerance: Patient limited by fatigue   Orestes Geiman 06/29/2011, 4:42 PM  Physician Documentation Your signature is required to indicate approval of the treatment plan as stated above.  Please sign and either send electronically or make a copy of this report for your files and return this physician signed original.   Please mark one 1.__approve of plan  2. ___approve of plan with the following conditions.   ______________________________                                                          _____________________ Physician Signature                                                                                                              Date

## 2011-06-30 LAB — DIFFERENTIAL
Basophils Absolute: 0 10*3/uL (ref 0.0–0.1)
Basophils Relative: 0 % (ref 0–1)
Eosinophils Absolute: 0 10*3/uL (ref 0.0–0.7)
Eosinophils Relative: 0 % (ref 0–5)
Lymphocytes Relative: 8 % — ABNORMAL LOW (ref 12–46)
Lymphs Abs: 0.6 10*3/uL — ABNORMAL LOW (ref 0.7–4.0)
Monocytes Absolute: 0.6 10*3/uL (ref 0.1–1.0)
Monocytes Relative: 8 % (ref 3–12)
Neutro Abs: 6.3 10*3/uL (ref 1.7–7.7)
Neutrophils Relative %: 84 % — ABNORMAL HIGH (ref 43–77)

## 2011-06-30 LAB — BASIC METABOLIC PANEL
BUN: 24 mg/dL — ABNORMAL HIGH (ref 6–23)
CO2: 32 mEq/L (ref 19–32)
Calcium: 10 mg/dL (ref 8.4–10.5)
Chloride: 97 mEq/L (ref 96–112)
Creatinine, Ser: 1.84 mg/dL — ABNORMAL HIGH (ref 0.4–1.2)
GFR calc Af Amer: 32 mL/min — ABNORMAL LOW (ref >60)
GFR calc non Af Amer: 27 mL/min — ABNORMAL LOW (ref >60)
Glucose, Bld: 103 mg/dL — ABNORMAL HIGH (ref 70–99)
Potassium: 4.5 mEq/L (ref 3.5–5.1)
Sodium: 136 mEq/L (ref 135–145)

## 2011-06-30 LAB — CBC
HCT: 28.9 % — ABNORMAL LOW (ref 36.0–46.0)
Hemoglobin: 9.6 g/dL — ABNORMAL LOW (ref 12.0–15.0)
MCHC: 33.3 g/dL (ref 30.0–36.0)
MCV: 85 fL (ref 78.0–100.0)
Platelets: 232 10*3/uL (ref 150–400)
RBC: 3.4 MIL/uL — ABNORMAL LOW (ref 3.87–5.11)
RDW: 16.2 % — ABNORMAL HIGH (ref 11.5–15.5)
WBC: 7.5 10*3/uL (ref 4.0–10.5)

## 2011-06-30 LAB — DIGOXIN LEVEL: Digoxin Level: 0.4 ng/mL — ABNORMAL LOW (ref 0.8–2.0)

## 2011-06-30 LAB — POCT CARDIAC MARKERS
CKMB, poc: <1 ng/mL — ABNORMAL LOW (ref 1.0–8.0)
Myoglobin, poc: 379 ng/mL (ref 12–200)
Troponin i, poc: <0.05 ng/mL (ref 0.00–0.09)

## 2011-06-30 LAB — PROTIME-INR
INR: 4.7 — ABNORMAL HIGH (ref 0.00–1.49)
Prothrombin Time: 48.4 seconds — ABNORMAL HIGH (ref 11.6–15.2)

## 2011-06-30 LAB — B-NATRIURETIC PEPTIDE (CONVERTED LAB): Pro B Natriuretic peptide (BNP): 368 pg/mL — ABNORMAL HIGH (ref 0.0–100.0)

## 2011-07-03 ENCOUNTER — Ambulatory Visit (HOSPITAL_COMMUNITY)
Admission: RE | Admit: 2011-07-03 | Discharge: 2011-07-03 | Disposition: A | Discharge status: Home / Self Care | Payer: Medicare Other | Source: Ambulatory Visit | Attending: Family Medicine | Admitting: Family Medicine

## 2011-07-03 NOTE — Progress Notes (Signed)
Physical Therapy Treatment Patient Details  Name: Emilyann Baillargeon MRN: [id number] Date of Birth: 04/07/1932  Today's Date: 07/03/2011 Time: [id number] Time Calculation (min): 28 min Charges: TEx28' Visit#: 2  of 8   Re-eval: 07/29/11    Subjective: Symptoms/Limitations Symptoms: I started to get a nauseated when I was waiting for my brother today. Pt reports she does not have much pain, but reports that her L leg felt as it was going to give out on her first thing this morning and still complains of walking hunched over first thing in the morning.  Pain Assessment Currently in Pain?: No/denies  Precautions/Restrictions     Mobility (including Balance)       Exercise/Treatments Stretches Active Hamstring Stretch: 3 reps;30 seconds;Limitations Active Hamstring Stretch Limitations: BLE Single Knee to Chest Stretch: 3 reps;30 seconds Lower Trunk Rotation: 5 reps;Limitations Lower Trunk Rotation Limitations: 5 sec hold each  Prone on Elbows Stretch: 2 reps;60 seconds ITB Stretch: 3 reps;30 seconds ITB Stretch Limitations: BLE Lumbar Exercises   Stability Clam: Supine;5 reps;Limitations Clam Limitations: Alternating Bridge: 15 reps Ab Set: Supine;5 reps Isometric Hip Flexion: 5 reps;5 seconds Straight Leg Raise: 5 reps Straight Leg Raises Limitations: Quadruped secondary to increased pain with prone BLE Heel Squeeze: Prone;5 reps;5 seconds Single Arm Raise: Quadruped;5 reps;Limitations Single Arm Raises Limitations: BUE Machine Exercises       Physical Therapy Assessment and Plan PT Assessment and Plan Clinical Impression Statement: Pt was able to tolerate all new lumbar ther-ex w/o an increase in pain, however reports that she has some gallbladder pain with prone exercises.  She continues to have the greatest limitations with gluteal and core strength. PT Plan: Add functional squats, heel and toe raises, review exercises from today and progress core stability as  necessary    Goals    Problem List Patient Active Problem List  Diagnoses  . HYPOTHYROIDISM  . DIABETES MELLITUS, TYPE II  . HYPERLIPIDEMIA  . DEHYDRATION  . OBESITY  . HYPERTENSION  . CARDIOMYOPATHY  . UNSPECIFIED CARDIOVASCULAR DISEASE  . VARICOSE VEINS LOWER EXTREMITIES W/OTH COMPS  . SINUSITIS, ACUTE  . GERD  . CHOLELITHIASIS  . CHRONIC KIDNEY DISEASE STAGE I  . ACUTE CYSTITIS  . OTHER SIGN AND SYMPTOM IN BREAST  . HIP PAIN, RIGHT  . DEGENERATIVE JOINT DISEASE, SPINE  . INSOMNIA  . FATIGUE  . NAUSEA  . NECK SPASM  . HEART VALVE REPLACED BY OTHER MEANS  . HEADACHE  . Allergic rhinitis  . Low back pain    PT - End of Session Activity Tolerance: Patient limited by fatigue  Grisela Mesch 07/03/2011, 3:13 PM

## 2011-07-04 LAB — DIFFERENTIAL
Basophils Absolute: 0
Basophils Relative: 0
Eosinophils Absolute: 0 — ABNORMAL LOW
Eosinophils Relative: 0
Lymphocytes Relative: 21
Lymphs Abs: 1.7
Monocytes Absolute: 0.7
Monocytes Relative: 9
Neutro Abs: 5.7
Neutrophils Relative %: 70

## 2011-07-04 LAB — DIRECT ANTIGLOBULIN TEST (NOT AT ARMC): DAT, IgG: NEGATIVE

## 2011-07-04 LAB — OCCULT BLOOD X 1 CARD TO LAB, STOOL
Fecal Occult Bld: NEGATIVE
Fecal Occult Bld: NEGATIVE
Fecal Occult Bld: NEGATIVE
Fecal Occult Bld: NEGATIVE
Fecal Occult Bld: NEGATIVE
Fecal Occult Bld: NEGATIVE

## 2011-07-04 LAB — CBC
HCT: 32.7 — ABNORMAL LOW
Hemoglobin: 10.9 — ABNORMAL LOW
MCHC: 33.2
MCV: 83
Platelets: 302
RBC: 3.94
RDW: 17 — ABNORMAL HIGH
WBC: 8.2

## 2011-07-04 LAB — ANTIBODY SCREEN: Antibody Screen: NEGATIVE

## 2011-07-05 LAB — DIFFERENTIAL
Basophils Absolute: 0
Basophils Relative: 0
Eosinophils Absolute: 0
Eosinophils Relative: 0
Lymphocytes Relative: 18
Lymphs Abs: 1.2
Monocytes Absolute: 0.6
Monocytes Relative: 9
Neutro Abs: 5
Neutrophils Relative %: 73

## 2011-07-05 LAB — RETICULOCYTES
RBC.: 3.86 — ABNORMAL LOW
Retic Count, Absolute: 119.7
Retic Ct Pct: 3.1

## 2011-07-05 LAB — IRON AND TIBC
Iron: 62
Saturation Ratios: 18 — ABNORMAL LOW
TIBC: 337
UIBC: 275

## 2011-07-05 LAB — CBC
HCT: 32.2 — ABNORMAL LOW
Hemoglobin: 10.8 — ABNORMAL LOW
MCHC: 33.5
MCV: 83.4
Platelets: 208
RBC: 3.86 — ABNORMAL LOW
RDW: 17.4 — ABNORMAL HIGH
WBC: 6.8

## 2011-07-05 LAB — TSH: TSH: 1.365

## 2011-07-05 LAB — VITAMIN B12: Vitamin B-12: 480 (ref 211–911)

## 2011-07-05 LAB — LACTATE DEHYDROGENASE: LDH: 226

## 2011-07-05 LAB — HAPTOGLOBIN: Haptoglobin: <6 — ABNORMAL LOW

## 2011-07-05 LAB — FOLATE: Folate: 17.8

## 2011-07-05 LAB — FERRITIN: Ferritin: 320 — ABNORMAL HIGH (ref 10–291)

## 2011-07-06 LAB — HEPARIN LEVEL (UNFRACTIONATED)
Heparin Unfractionated: 0.1 — ABNORMAL LOW
Heparin Unfractionated: 0.1 — ABNORMAL LOW
Heparin Unfractionated: 0.24 — ABNORMAL LOW
Heparin Unfractionated: 0.29 — ABNORMAL LOW
Heparin Unfractionated: 0.38
Heparin Unfractionated: 0.49
Heparin Unfractionated: 0.52
Heparin Unfractionated: 0.64
Heparin Unfractionated: 0.72 — ABNORMAL HIGH
Heparin Unfractionated: 0.81 — ABNORMAL HIGH
Heparin Unfractionated: 0.84 — ABNORMAL HIGH
Heparin Unfractionated: 0.92 — ABNORMAL HIGH

## 2011-07-06 LAB — BASIC METABOLIC PANEL
BUN: 16
BUN: 18
CO2: 30
CO2: 32
Calcium: 8.9
Calcium: 9.2
Chloride: 102
Chloride: 99
Creatinine, Ser: 1.19
Creatinine, Ser: 1.22 — ABNORMAL HIGH
GFR calc Af Amer: 52 — ABNORMAL LOW
GFR calc Af Amer: 54 — ABNORMAL LOW
GFR calc non Af Amer: 43 — ABNORMAL LOW
GFR calc non Af Amer: 44 — ABNORMAL LOW
Glucose, Bld: 103 — ABNORMAL HIGH
Glucose, Bld: 117 — ABNORMAL HIGH
Potassium: 4.2
Potassium: 4.6
Sodium: 137
Sodium: 138

## 2011-07-06 LAB — COMPREHENSIVE METABOLIC PANEL
ALT: 13
AST: 18
Albumin: 3.2 — ABNORMAL LOW
Alkaline Phosphatase: 44
BUN: 24 — ABNORMAL HIGH
CO2: 32
Calcium: 8.7
Chloride: 101
Creatinine, Ser: 1.15
GFR calc Af Amer: 56 — ABNORMAL LOW
GFR calc non Af Amer: 46 — ABNORMAL LOW
Glucose, Bld: 73
Potassium: 3.9
Sodium: 137
Total Bilirubin: 0.6
Total Protein: 7

## 2011-07-06 LAB — CBC
HCT: 28.4 — ABNORMAL LOW
HCT: 28.4 — ABNORMAL LOW
HCT: 29.1 — ABNORMAL LOW
HCT: 29.7 — ABNORMAL LOW
HCT: 29.7 — ABNORMAL LOW
HCT: 29.8 — ABNORMAL LOW
HCT: 30 — ABNORMAL LOW
Hemoglobin: 10 — ABNORMAL LOW
Hemoglobin: 9.4 — ABNORMAL LOW
Hemoglobin: 9.5 — ABNORMAL LOW
Hemoglobin: 9.6 — ABNORMAL LOW
Hemoglobin: 9.8 — ABNORMAL LOW
Hemoglobin: 9.9 — ABNORMAL LOW
Hemoglobin: 9.9 — ABNORMAL LOW
MCHC: 32.7
MCHC: 32.9
MCHC: 33.2
MCHC: 33.2
MCHC: 33.3
MCHC: 33.4
MCHC: 33.6
MCV: 81.9
MCV: 82.3
MCV: 82.7
MCV: 83
MCV: 83.2
MCV: 83.2
MCV: 83.5
Platelets: 199
Platelets: 202
Platelets: 202
Platelets: 221
Platelets: 241
Platelets: 258
Platelets: 266
RBC: 3.43 — ABNORMAL LOW
RBC: 3.47 — ABNORMAL LOW
RBC: 3.5 — ABNORMAL LOW
RBC: 3.56 — ABNORMAL LOW
RBC: 3.58 — ABNORMAL LOW
RBC: 3.61 — ABNORMAL LOW
RBC: 3.63 — ABNORMAL LOW
RDW: 16.5 — ABNORMAL HIGH
RDW: 16.9 — ABNORMAL HIGH
RDW: 16.9 — ABNORMAL HIGH
RDW: 16.9 — ABNORMAL HIGH
RDW: 17 — ABNORMAL HIGH
RDW: 17.1 — ABNORMAL HIGH
RDW: 17.6 — ABNORMAL HIGH
WBC: 10
WBC: 10.1
WBC: 6.7
WBC: 6.7
WBC: 6.9
WBC: 8.3
WBC: 9.5

## 2011-07-06 LAB — DIFFERENTIAL
Band Neutrophils: 0
Basophils Absolute: 0
Basophils Absolute: 0
Basophils Absolute: 0
Basophils Absolute: 0
Basophils Absolute: 0
Basophils Absolute: 0
Basophils Relative: 0
Basophils Relative: 0
Basophils Relative: 0
Basophils Relative: 0
Basophils Relative: 0
Basophils Relative: 0
Basophils Relative: 0
Blasts: 0
Eosinophils Absolute: 0
Eosinophils Absolute: 0
Eosinophils Absolute: 0
Eosinophils Absolute: 0
Eosinophils Absolute: 0
Eosinophils Absolute: 0
Eosinophils Relative: 0
Eosinophils Relative: 0
Eosinophils Relative: 0
Eosinophils Relative: 0
Eosinophils Relative: 0
Eosinophils Relative: 0
Eosinophils Relative: 0
Lymphocytes Relative: 10 — ABNORMAL LOW
Lymphocytes Relative: 14
Lymphocytes Relative: 18
Lymphocytes Relative: 19
Lymphocytes Relative: 19
Lymphocytes Relative: 22
Lymphocytes Relative: 22
Lymphs Abs: 0.8
Lymphs Abs: 1.3
Lymphs Abs: 1.3
Lymphs Abs: 1.5
Lymphs Abs: 1.5
Lymphs Abs: 1.8
Metamyelocytes Relative: 0
Monocytes Absolute: 0.5
Monocytes Absolute: 0.5
Monocytes Absolute: 0.6
Monocytes Absolute: 0.6
Monocytes Absolute: 0.7
Monocytes Absolute: 0.7
Monocytes Relative: 4
Monocytes Relative: 7
Monocytes Relative: 7
Monocytes Relative: 7
Monocytes Relative: 7
Monocytes Relative: 8
Monocytes Relative: 8
Myelocytes: 0
Neutro Abs: 4.7
Neutro Abs: 4.8
Neutro Abs: 5
Neutro Abs: 6.9
Neutro Abs: 7.5
Neutro Abs: 7.5
Neutrophils Relative %: 69
Neutrophils Relative %: 70
Neutrophils Relative %: 74
Neutrophils Relative %: 75
Neutrophils Relative %: 77
Neutrophils Relative %: 79 — ABNORMAL HIGH
Neutrophils Relative %: 83 — ABNORMAL HIGH
Promyelocytes Absolute: 0
nRBC: 0

## 2011-07-06 LAB — PROTIME-INR
INR: 1.5
INR: 1.5
INR: 1.6 — ABNORMAL HIGH
INR: 1.9 — ABNORMAL HIGH
INR: 2.4 — ABNORMAL HIGH
INR: 2.6 — ABNORMAL HIGH
INR: 3.2 — ABNORMAL HIGH
Prothrombin Time: 18.7 — ABNORMAL HIGH
Prothrombin Time: 18.9 — ABNORMAL HIGH
Prothrombin Time: 20.1 — ABNORMAL HIGH
Prothrombin Time: 22.8 — ABNORMAL HIGH
Prothrombin Time: 27 — ABNORMAL HIGH
Prothrombin Time: 29.1 — ABNORMAL HIGH
Prothrombin Time: 34.8 — ABNORMAL HIGH

## 2011-07-06 LAB — URINALYSIS, ROUTINE W REFLEX MICROSCOPIC
Bilirubin Urine: NEGATIVE
Glucose, UA: NEGATIVE
Ketones, ur: NEGATIVE
Leukocytes, UA: NEGATIVE
Nitrite: NEGATIVE
Protein, ur: NEGATIVE
Specific Gravity, Urine: 1.005
Urobilinogen, UA: 0.2
pH: 5.5

## 2011-07-06 LAB — DIGOXIN LEVEL: Digoxin Level: 1.6

## 2011-07-06 LAB — HEMOGLOBIN A1C
Hgb A1c MFr Bld: 6.6 — ABNORMAL HIGH
Mean Plasma Glucose: 158

## 2011-07-06 LAB — TSH: TSH: 2.563

## 2011-07-06 LAB — URINE MICROSCOPIC-ADD ON

## 2011-07-11 ENCOUNTER — Ambulatory Visit (HOSPITAL_COMMUNITY): Payer: Medicare Other | Admitting: Physical Therapy

## 2011-07-13 ENCOUNTER — Ambulatory Visit (HOSPITAL_COMMUNITY): Payer: Medicare Other

## 2011-07-24 ENCOUNTER — Telehealth: Payer: Self-pay | Admitting: Family Medicine

## 2011-07-25 ENCOUNTER — Other Ambulatory Visit: Payer: Self-pay | Admitting: Family Medicine

## 2011-07-25 MED ORDER — CLINDAMYCIN HCL 300 MG PO CAPS: ORAL_CAPSULE | ORAL | Status: DC

## 2011-07-25 NOTE — Telephone Encounter (Signed)
Pt advised med sent to her pharmacy

## 2011-08-01 ENCOUNTER — Telehealth: Payer: Self-pay | Admitting: Family Medicine

## 2011-08-02 MED ORDER — DIGOXIN 250 MCG PO TABS: ORAL_TABLET | ORAL | Status: DC

## 2011-08-02 NOTE — Telephone Encounter (Signed)
Sent in to her mail order for 90 days

## 2011-08-04 ENCOUNTER — Telehealth: Payer: Self-pay | Admitting: Family Medicine

## 2011-08-07 ENCOUNTER — Telehealth: Payer: Self-pay | Admitting: Family Medicine

## 2011-08-07 MED ORDER — DIGOXIN 250 MCG PO TABS: ORAL_TABLET | ORAL | Status: DC

## 2011-08-07 NOTE — Telephone Encounter (Signed)
Already sent a week supply to [facility] per previous message

## 2011-08-07 NOTE — Telephone Encounter (Signed)
1 week sent in

## 2011-08-08 ENCOUNTER — Other Ambulatory Visit: Payer: Self-pay

## 2011-08-08 ENCOUNTER — Inpatient Hospital Stay (HOSPITAL_COMMUNITY): Admission: RE | Admit: 2011-08-08 | Payer: Medicare Other | Source: Ambulatory Visit | Admitting: Physical Therapy

## 2011-08-08 ENCOUNTER — Telehealth: Payer: Self-pay | Admitting: Family Medicine

## 2011-08-08 MED ORDER — DIGOXIN 125 MCG PO TABS: 125.0000 ug | ORAL_TABLET | Freq: Every day | ORAL | Status: DC

## 2011-08-08 NOTE — Telephone Encounter (Signed)
She wanted 7 days of lanoxin while she waited for mail order shipment. I sent what was on our list and she said she takes 0.125? I don't know what to send in now. I called [facility] but she hasn't had it filled there since 09. And it was for the strength WE have on record

## 2011-08-08 NOTE — Telephone Encounter (Signed)
Sorted out. Sent in

## 2011-08-08 NOTE — Telephone Encounter (Signed)
Corrected on our medlist

## 2011-08-08 NOTE — Telephone Encounter (Signed)
Pt advised to bring bottle this pm so this can be sorted out

## 2011-08-10 ENCOUNTER — Other Ambulatory Visit: Payer: Self-pay | Admitting: Family Medicine

## 2011-08-21 ENCOUNTER — Telehealth: Payer: Self-pay | Admitting: Family Medicine

## 2011-08-22 NOTE — Telephone Encounter (Signed)
Script written , pls fax and let her know

## 2011-08-22 NOTE — Telephone Encounter (Signed)
Patient is aware 

## 2011-09-04 ENCOUNTER — Encounter (HOSPITAL_COMMUNITY): Payer: Medicare Other | Attending: Oncology

## 2011-09-04 ENCOUNTER — Other Ambulatory Visit: Payer: Self-pay | Admitting: Family Medicine

## 2011-09-04 DIAGNOSIS — D649 Anemia, unspecified: Secondary | ICD-10-CM

## 2011-09-04 LAB — RENAL FUNCTION PANEL
Albumin: 4 g/dL (ref 3.5–5.2)
BUN: 37 mg/dL — ABNORMAL HIGH (ref 6–23)
CO2: 33 mEq/L — ABNORMAL HIGH (ref 19–32)
Calcium: 10.6 mg/dL — ABNORMAL HIGH (ref 8.4–10.5)
Chloride: 100 mEq/L (ref 96–112)
Creatinine, Ser: 1.89 mg/dL — ABNORMAL HIGH (ref 0.50–1.10)
GFR calc Af Amer: 28 mL/min — ABNORMAL LOW (ref >90)
GFR calc non Af Amer: 25 mL/min — ABNORMAL LOW (ref >90)
Glucose, Bld: 117 mg/dL — ABNORMAL HIGH (ref 70–99)
Phosphorus: 3.9 mg/dL (ref 2.3–4.6)
Potassium: 4.4 mEq/L (ref 3.5–5.1)
Sodium: 140 mEq/L (ref 135–145)

## 2011-09-04 LAB — IRON AND TIBC
Iron: 91 ug/dL (ref 42–135)
Saturation Ratios: 19 % — ABNORMAL LOW (ref 20–55)
TIBC: 482 ug/dL — ABNORMAL HIGH (ref 250–470)
UIBC: 391 ug/dL (ref 125–400)

## 2011-09-04 LAB — CBC
HCT: 32.9 % — ABNORMAL LOW (ref 36.0–46.0)
Hemoglobin: 10.5 g/dL — ABNORMAL LOW (ref 12.0–15.0)
MCH: 28.9 pg (ref 26.0–34.0)
MCHC: 31.9 g/dL (ref 30.0–36.0)
MCV: 90.6 fL (ref 78.0–100.0)
Platelets: 248 10*3/uL (ref 150–400)
RBC: 3.63 MIL/uL — ABNORMAL LOW (ref 3.87–5.11)
RDW: 15.4 % (ref 11.5–15.5)
WBC: 5.2 10*3/uL (ref 4.0–10.5)

## 2011-09-04 LAB — FERRITIN: Ferritin: 376 ng/mL — ABNORMAL HIGH (ref 10–291)

## 2011-09-04 LAB — RETICULOCYTES
RBC.: 3.63 MIL/uL — ABNORMAL LOW (ref 3.87–5.11)
Retic Count, Absolute: 79.9 10*3/uL (ref 19.0–186.0)
Retic Ct Pct: 2.2 % (ref 0.4–3.1)

## 2011-09-04 NOTE — Progress Notes (Signed)
Labs drawn today for cbc,retic, ferr, Fe & TIBC,  Also renal drawm for Dr. Lynnette Caffey.

## 2011-09-06 ENCOUNTER — Encounter (HOSPITAL_BASED_OUTPATIENT_CLINIC_OR_DEPARTMENT_OTHER): Payer: Medicare Other | Admitting: Oncology

## 2011-09-06 VITALS — BP 144/77 | HR 94 | Temp 98.2°F | Ht 68.0 in | Wt 242.0 lb

## 2011-09-06 DIAGNOSIS — E611 Iron deficiency: Secondary | ICD-10-CM

## 2011-09-06 DIAGNOSIS — D599 Acquired hemolytic anemia, unspecified: Secondary | ICD-10-CM

## 2011-09-06 DIAGNOSIS — K5900 Constipation, unspecified: Secondary | ICD-10-CM

## 2011-09-06 DIAGNOSIS — R5381 Other malaise: Secondary | ICD-10-CM

## 2011-09-06 DIAGNOSIS — R5383 Other fatigue: Secondary | ICD-10-CM

## 2011-09-06 MED ORDER — FERUMOXYTOL INJECTION 510 MG/17 ML: 1020.0000 mg | Freq: Once | INTRAVENOUS | Status: DC

## 2011-09-06 NOTE — Patient Instructions (Signed)
Yarimar Reinhart  [id number] Dec 27, 1931  [facility]  Discharge Instructions  RECOMMENDATIONS MADE BY THE CONSULTANT AND ANY TEST RESULTS WILL BE SENT TO YOUR REFERRING DOCTOR.   EXAM FINDINGS BY MD TODAY AND SIGNS AND SYMPTOMS TO REPORT TO CLINIC OR PRIMARY MD: Need to stop your oral iron on December 31. Will schedule you for Feraheme (intravenous iron) for January 9th.  MEDICATIONS PRESCRIBED: none   INSTRUCTIONS GIVEN AND DISCUSSED: Other :  Will check labs a few weeks after the iron infusion and will have you come back to see [name] a few days later.     I acknowledge that I have been informed and understand all the instructions given to me and received a copy. I do not have any more questions at this time, but understand that I may call the [facility] at [facility] at [phone] during business hours should I have any further questions or need assistance in obtaining follow-up care.    __________________________________________  _____________  __________ Signature of Patient or Authorized Representative            Date                   Time    __________________________________________ Nurse's Signature

## 2011-09-06 NOTE — Progress Notes (Signed)
This office note has been dictated.

## 2011-09-06 NOTE — Progress Notes (Signed)
CC:   [name] [name], M.D. [name], M.D. [name], M.D.  DIAGNOSES: 1. 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 2. History of gallstones, asymptomatic. 3. History of nonischemic cardiomyopathy. 4. History of mild renal insufficiency, chronic but stable. 5. History of obesity. 6. Diabetes mellitus. 7. Iron deficiency. 8. Mildly enlarged spleen with multiple cystic-like calcifications     that have been stable since 2005. 9. 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 Margot Chimes. 10.EGD and colonoscopy by Dr. Gala Romney on 06/12/2007. 11.Hypercholesterolemia. 12.Broken left lower leg with chronic discomfort. 13.Tubal resection in her 32s. 14.Thyrotoxicosis in her 63s treated thyroidectomy. 15.Pacemaker insertion in 2004 and defibrillator placement in 2006     with replacement in 2007. 16.Aortic valve and mitral valve replacements in the past.  HISTORY OF PRESENT ILLNESS:  [name] [name] usually has an adequate ferritin, but her serum iron and TIBC study still indicate that she is iron deficient in spite of oral iron on a daily basis.  She feels weak and tired chronically.  The iron constipates her as well, she thinks.  What I am going to try to do is give her 1 dose of IV Feraheme and see if it makes a difference in her sense of well being, both from the standpoint of stopping the oral iron and increasing her iron stores.  I do not think her ferritin is reliable because of the hemolysis.  She would like to give this a go as was well.  In January, after the holidays, on the 9th, we will give her 1,020 mg of IV iron and then we will get blood tests 2 weeks later including CBC, iron, TIBC, and ferritin.  I will see her a week after that.  We will see if she is feeling better and see what her iron studies show, hemoglobin,  etc.  She did ask about this breast lesion which, thus far, has appeared to be a benign papilloma.  She still needs to followup with Dr. Margot Chimes, but I have asked her to try not worry too much about that.  I certainly agree with Dr. Lowella Fairy recent note from October that if she would require surgery, she would need heparin/Coumadin crossover for the prosthetic valves.    ______________________________ Gaston Islam. Tressie Stalker, MD ESN/MEDQ  D:  09/06/2011  T:  09/06/2011  Job:  [id number]

## 2011-09-12 ENCOUNTER — Other Ambulatory Visit: Payer: Self-pay | Admitting: Family Medicine

## 2011-09-14 ENCOUNTER — Telehealth: Payer: Self-pay | Admitting: Family Medicine

## 2011-09-14 NOTE — Telephone Encounter (Signed)
Called and refilled med

## 2011-09-27 DIAGNOSIS — E1129 Type 2 diabetes mellitus with other diabetic kidney complication: Secondary | ICD-10-CM | POA: Diagnosis not present

## 2011-09-27 DIAGNOSIS — J449 Chronic obstructive pulmonary disease, unspecified: Secondary | ICD-10-CM | POA: Diagnosis not present

## 2011-09-27 DIAGNOSIS — D649 Anemia, unspecified: Secondary | ICD-10-CM | POA: Diagnosis not present

## 2011-09-27 DIAGNOSIS — I10 Essential (primary) hypertension: Secondary | ICD-10-CM | POA: Diagnosis not present

## 2011-09-27 DIAGNOSIS — N183 Chronic kidney disease, stage 3 unspecified: Secondary | ICD-10-CM | POA: Diagnosis not present

## 2011-09-27 DIAGNOSIS — E1165 Type 2 diabetes mellitus with hyperglycemia: Secondary | ICD-10-CM | POA: Diagnosis not present

## 2011-09-27 DIAGNOSIS — N189 Chronic kidney disease, unspecified: Secondary | ICD-10-CM | POA: Diagnosis not present

## 2011-09-27 DIAGNOSIS — I509 Heart failure, unspecified: Secondary | ICD-10-CM | POA: Diagnosis not present

## 2011-10-04 ENCOUNTER — Encounter (HOSPITAL_COMMUNITY): Payer: Medicare Other | Attending: Oncology

## 2011-10-04 ENCOUNTER — Encounter (HOSPITAL_COMMUNITY): Payer: Self-pay

## 2011-10-04 VITALS — BP 91/52 | HR 73 | Temp 97.3°F

## 2011-10-04 DIAGNOSIS — D599 Acquired hemolytic anemia, unspecified: Secondary | ICD-10-CM | POA: Diagnosis not present

## 2011-10-04 DIAGNOSIS — E611 Iron deficiency: Secondary | ICD-10-CM

## 2011-10-04 MED ORDER — SODIUM CHLORIDE 0.9 % IJ SOLN
INTRAMUSCULAR | Status: AC
  Administered 2011-10-04: 10 mL
  Filled 2011-10-04: qty 10

## 2011-10-04 MED ORDER — SODIUM CHLORIDE 0.9 % IJ SOLN
10.0000 mL | INTRAMUSCULAR | Status: DC | PRN
  Administered 2011-10-04: 10 mL
  Filled 2011-10-04: qty 10

## 2011-10-04 MED ORDER — SODIUM CHLORIDE 0.9 % IV SOLN
1020.0000 mg | Freq: Once | INTRAVENOUS | Status: AC
  Administered 2011-10-04: 1020 mg via INTRAVENOUS
  Filled 2011-10-04: qty 34

## 2011-10-04 MED ORDER — SODIUM CHLORIDE 0.9 % IV SOLN
Freq: Once | INTRAVENOUS | Status: AC
  Administered 2011-10-04: 50 mL via INTRAVENOUS

## 2011-10-04 NOTE — Progress Notes (Signed)
Tolerated infusion well. BP lower than baseline--no symptoms. Will watch for 15-20 min. BP 101/57 Home without complaints

## 2011-10-10 DIAGNOSIS — H0520 Unspecified exophthalmos: Secondary | ICD-10-CM | POA: Diagnosis not present

## 2011-10-10 DIAGNOSIS — E119 Type 2 diabetes mellitus without complications: Secondary | ICD-10-CM | POA: Diagnosis not present

## 2011-10-10 DIAGNOSIS — M25569 Pain in unspecified knee: Secondary | ICD-10-CM | POA: Diagnosis not present

## 2011-10-10 DIAGNOSIS — H40019 Open angle with borderline findings, low risk, unspecified eye: Secondary | ICD-10-CM | POA: Diagnosis not present

## 2011-10-10 DIAGNOSIS — M76899 Other specified enthesopathies of unspecified lower limb, excluding foot: Secondary | ICD-10-CM | POA: Diagnosis not present

## 2011-10-10 DIAGNOSIS — H02409 Unspecified ptosis of unspecified eyelid: Secondary | ICD-10-CM | POA: Diagnosis not present

## 2011-10-18 ENCOUNTER — Encounter (HOSPITAL_BASED_OUTPATIENT_CLINIC_OR_DEPARTMENT_OTHER): Payer: Medicare Other

## 2011-10-18 DIAGNOSIS — E611 Iron deficiency: Secondary | ICD-10-CM

## 2011-10-18 DIAGNOSIS — D599 Acquired hemolytic anemia, unspecified: Secondary | ICD-10-CM

## 2011-10-18 LAB — RETICULOCYTES
RBC.: 3.72 MIL/uL — ABNORMAL LOW (ref 3.87–5.11)
Retic Count, Absolute: 89.3 10*3/uL (ref 19.0–186.0)
Retic Ct Pct: 2.4 % (ref 0.4–3.1)

## 2011-10-18 LAB — CBC
HCT: 34.3 % — ABNORMAL LOW (ref 36.0–46.0)
Hemoglobin: 10.9 g/dL — ABNORMAL LOW (ref 12.0–15.0)
MCH: 29.3 pg (ref 26.0–34.0)
MCHC: 31.8 g/dL (ref 30.0–36.0)
MCV: 92.2 fL (ref 78.0–100.0)
Platelets: 245 10*3/uL (ref 150–400)
RBC: 3.72 MIL/uL — ABNORMAL LOW (ref 3.87–5.11)
RDW: 16.3 % — ABNORMAL HIGH (ref 11.5–15.5)
WBC: 7.5 10*3/uL (ref 4.0–10.5)

## 2011-10-18 LAB — IRON AND TIBC
Iron: 111 ug/dL (ref 42–135)
Saturation Ratios: 23 % (ref 20–55)
TIBC: 486 ug/dL — ABNORMAL HIGH (ref 250–470)
UIBC: 375 ug/dL (ref 125–400)

## 2011-10-18 LAB — FERRITIN: Ferritin: 1205 ng/mL — ABNORMAL HIGH (ref 10–291)

## 2011-10-18 NOTE — Progress Notes (Signed)
Labs drawn today for cbc,ferr,retic,Iron and IBC 

## 2011-10-19 DIAGNOSIS — E119 Type 2 diabetes mellitus without complications: Secondary | ICD-10-CM | POA: Diagnosis not present

## 2011-10-19 DIAGNOSIS — E1149 Type 2 diabetes mellitus with other diabetic neurological complication: Secondary | ICD-10-CM | POA: Diagnosis not present

## 2011-10-19 DIAGNOSIS — I059 Rheumatic mitral valve disease, unspecified: Secondary | ICD-10-CM | POA: Diagnosis not present

## 2011-10-19 LAB — COMPLETE METABOLIC PANEL WITH GFR
ALT: 12 U/L (ref 0–35)
AST: 28 U/L (ref 0–37)
Albumin: 4.4 g/dL (ref 3.5–5.2)
Alkaline Phosphatase: 36 U/L — ABNORMAL LOW (ref 39–117)
BUN: 54 mg/dL — ABNORMAL HIGH (ref 6–23)
CO2: 29 mEq/L (ref 19–32)
Calcium: 10.1 mg/dL (ref 8.4–10.5)
Chloride: 98 mEq/L (ref 96–112)
Creat: 2.29 mg/dL — ABNORMAL HIGH (ref 0.50–1.10)
GFR, Est African American: 23 mL/min — ABNORMAL LOW (ref >60)
GFR, Est Non African American: 20 mL/min — ABNORMAL LOW (ref >60)
Glucose, Bld: 104 mg/dL — ABNORMAL HIGH (ref 70–99)
Potassium: 4.8 mEq/L (ref 3.5–5.3)
Sodium: 138 mEq/L (ref 135–145)
Total Bilirubin: 0.4 mg/dL (ref 0.3–1.2)
Total Protein: 8 g/dL (ref 6.0–8.3)

## 2011-10-19 LAB — HEMOGLOBIN A1C
Hgb A1c MFr Bld: 6.2 % — ABNORMAL HIGH (ref <5.7)
Mean Plasma Glucose: 131 mg/dL — ABNORMAL HIGH (ref <117)

## 2011-10-19 LAB — TSH: TSH: 1.293 u[IU]/mL (ref 0.350–4.500)

## 2011-10-20 ENCOUNTER — Encounter (HOSPITAL_BASED_OUTPATIENT_CLINIC_OR_DEPARTMENT_OTHER): Payer: Medicare Other | Admitting: Oncology

## 2011-10-20 VITALS — BP 146/78 | HR 89 | Temp 97.5°F | Ht 68.0 in | Wt 245.0 lb

## 2011-10-20 DIAGNOSIS — N6459 Other signs and symptoms in breast: Secondary | ICD-10-CM

## 2011-10-20 DIAGNOSIS — N289 Disorder of kidney and ureter, unspecified: Secondary | ICD-10-CM

## 2011-10-20 DIAGNOSIS — D589 Hereditary hemolytic anemia, unspecified: Secondary | ICD-10-CM

## 2011-10-20 DIAGNOSIS — E611 Iron deficiency: Secondary | ICD-10-CM

## 2011-10-20 DIAGNOSIS — D599 Acquired hemolytic anemia, unspecified: Secondary | ICD-10-CM | POA: Diagnosis not present

## 2011-10-20 NOTE — Progress Notes (Signed)
This office note has been dictated.

## 2011-10-20 NOTE — Patient Instructions (Signed)
[facility]  Discharge Instructions  RECOMMENDATIONS MADE BY THE CONSULTANT AND ANY TEST RESULTS WILL BE SENT TO YOUR REFERRING DOCTOR.   EXAM FINDINGS BY MD TODAY AND SIGNS AND SYMPTOMS TO REPORT TO CLINIC OR PRIMARY MD: exam good    SPECIAL INSTRUCTIONS/FOLLOW-UP: Lab work Needed in June then to see the Doctor.   I acknowledge that I have been informed and understand all the instructions given to me and received a copy. I do not have any more questions at this time, but understand that I may call the Specialty Clinic at [facility] at [phone] during business hours should I have any further questions or need assistance in obtaining follow-up care.    __________________________________________  _____________  __________ Signature of Patient or Authorized Representative            Date                   Time    __________________________________________ Nurse's Signature

## 2011-10-20 NOTE — Progress Notes (Signed)
CC:   [name] [name], M.D. [name] [name], M.D. [name] [name], M.D.  DIAGNOSES: 1. 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 2. Left nipple discharge felt to be due to a benign papilloma followed     by Dr. Margot Chimes and she will see him again in June. 3. Obesity. 4. Mild renal insufficiency. 5. Nonischemic cardiomyopathy. 6. Gallstones, asymptomatic. 7. Diabetes mellitus. 8. Iron deficiency with an elevated TIBC, normal serum iron but     elevated TIBC and low percent saturation. 9. EGD and colonoscopy by Dr. Gala Romney on 06/12/2007. 10.Hypercholesterolemia. 11.Broken left lower leg with chronic discomfort in the past. 12.Tubal resection in her 69s. 13.Thyrotoxicosis in her 81s, treated by thyroidectomy. 14.Pacemaker insertion 2004 and defibrillator placement in 2006 with     replacement 2007.  [name] [name]'s blood work after 1 dose of IV iron done because she has had a serum iron of 91, TIBC of 482, percent saturation 19% only, we thought it was worth a try to see if that would help her feel any better. Interestingly her hemoglobin today is 10.9, the best it has been in more than a year.  White count and platelets are fine but her ferritin is 1205 and interestingly her percent saturation of her serum iron over TIBC is up to 23% but her TIBC still remains above the upper limits of normal interestingly.  She feels better she states but still feels a little worried about the left breast discharge but I think that is the least of her worries.  Her most recent 2D echo showed impaired left ventricular relaxation with an ejection fraction less than or equal to 25% so I think she has other issues to worry about more than this and I do not think we need to rechallenge her again any time soon with IV iron.  She had been on oral iron in the past.  She is actually still on her oral iron and  we will leave that alone.  So I will see her in June.  I will double check her haptoglobin and LDH at that time along with CBC and reticulocyte count.  I do not think I will check her iron studies next time unless her blood counts have changed.    ______________________________ Gaston Islam. Tressie Stalker, MD ESN/MEDQ  D:  10/20/2011  T:  10/20/2011  Job:  [id number]

## 2011-10-25 ENCOUNTER — Encounter: Payer: Self-pay | Admitting: Family Medicine

## 2011-10-25 ENCOUNTER — Ambulatory Visit (INDEPENDENT_AMBULATORY_CARE_PROVIDER_SITE_OTHER): Payer: Medicare Other | Admitting: Family Medicine

## 2011-10-25 VITALS — BP 108/62 | HR 72 | Resp 16 | Ht 68.0 in | Wt 245.1 lb

## 2011-10-25 DIAGNOSIS — E039 Hypothyroidism, unspecified: Secondary | ICD-10-CM | POA: Diagnosis not present

## 2011-10-25 DIAGNOSIS — S139XXA Sprain of joints and ligaments of unspecified parts of neck, initial encounter: Secondary | ICD-10-CM

## 2011-10-25 DIAGNOSIS — I10 Essential (primary) hypertension: Secondary | ICD-10-CM | POA: Diagnosis not present

## 2011-10-25 DIAGNOSIS — E119 Type 2 diabetes mellitus without complications: Secondary | ICD-10-CM | POA: Diagnosis not present

## 2011-10-25 DIAGNOSIS — E785 Hyperlipidemia, unspecified: Secondary | ICD-10-CM

## 2011-10-25 DIAGNOSIS — K8020 Calculus of gallbladder without cholecystitis without obstruction: Secondary | ICD-10-CM

## 2011-10-25 DIAGNOSIS — M542 Cervicalgia: Secondary | ICD-10-CM

## 2011-10-25 DIAGNOSIS — N184 Chronic kidney disease, stage 4 (severe): Secondary | ICD-10-CM

## 2011-10-25 DIAGNOSIS — K219 Gastro-esophageal reflux disease without esophagitis: Secondary | ICD-10-CM

## 2011-10-25 MED ORDER — FLUTICASONE PROPIONATE 50 MCG/ACT NA SUSP: 1.0000 | Freq: Every day | NASAL | Status: DC

## 2011-10-25 MED ORDER — GABAPENTIN 100 MG PO CAPS: ORAL_CAPSULE | ORAL | Status: DC

## 2011-10-25 MED ORDER — GLIPIZIDE ER 2.5 MG PO TB24: 2.5000 mg | ORAL_TABLET | Freq: Every day | ORAL | Status: DC

## 2011-10-25 MED ORDER — TEMAZEPAM 30 MG PO CAPS: ORAL_CAPSULE | ORAL | Status: DC

## 2011-10-25 MED ORDER — METAXALONE 400 MG HALF TABLET: ORAL_TABLET | ORAL | Status: DC

## 2011-10-25 NOTE — Patient Instructions (Signed)
F/U in 4 months.  Dose reduction in glipizide 5mg  tablet, break in HALF , take HALf daily, new pill will be 2.5mg  one daily  New medication skelaxin as a muscle relaxant.   Fasting lipid, cmp and eGFR, HBa1C and TSH in 4 months.  Call if you need me before, I hope you keep well!

## 2011-10-27 ENCOUNTER — Other Ambulatory Visit (HOSPITAL_COMMUNITY): Payer: Self-pay | Admitting: Cardiovascular Disease

## 2011-10-27 DIAGNOSIS — R52 Pain, unspecified: Secondary | ICD-10-CM

## 2011-10-27 DIAGNOSIS — Z4502 Encounter for adjustment and management of automatic implantable cardiac defibrillator: Secondary | ICD-10-CM | POA: Diagnosis not present

## 2011-10-27 DIAGNOSIS — I359 Nonrheumatic aortic valve disorder, unspecified: Secondary | ICD-10-CM | POA: Diagnosis not present

## 2011-10-27 DIAGNOSIS — I059 Rheumatic mitral valve disease, unspecified: Secondary | ICD-10-CM | POA: Diagnosis not present

## 2011-10-27 DIAGNOSIS — Z79899 Other long term (current) drug therapy: Secondary | ICD-10-CM | POA: Diagnosis not present

## 2011-10-27 DIAGNOSIS — I509 Heart failure, unspecified: Secondary | ICD-10-CM | POA: Diagnosis not present

## 2011-10-27 DIAGNOSIS — M109 Gout, unspecified: Secondary | ICD-10-CM | POA: Diagnosis not present

## 2011-10-29 NOTE — Assessment & Plan Note (Signed)
Followed by nephrology. 

## 2011-10-29 NOTE — Assessment & Plan Note (Signed)
Controlled, no change in medication  

## 2011-10-29 NOTE — Assessment & Plan Note (Signed)
Uncontrolled pain, no response to flexeril will try skelaxin which she states has helped in the past

## 2011-10-29 NOTE — Assessment & Plan Note (Signed)
rept labs nex t draw, low fat diet discussed and encouraged, controlled when last  checked

## 2011-10-29 NOTE — Assessment & Plan Note (Signed)
Over corrected, poor renal function likely contributing, reduction in medication

## 2011-10-29 NOTE — Assessment & Plan Note (Signed)
Essentially asymptomatic, pt is a high surgical risk

## 2011-10-29 NOTE — Progress Notes (Signed)
  Subjective:    Patient ID: [name] [name], female    DOB: 1931/10/06, 78 y.o.   MRN: [id number]  HPI The PT is here for follow up and re-evaluation of chronic medical conditions, medication management and review of any available recent lab and radiology data.  Preventive health is updated, specifically  Cancer screening and Immunization.   Questions or concerns regarding consultations or procedures which the PT has had in the interim are  addressed. The PT denies any adverse reactions to current medications since the last visit.  There are no new concerns.  C/o uncontrolled back and neck pain and spasm, no response to flexeril     Review of Systems See HPI Denies recent fever or chills. Denies sinus pressure, nasal congestion, ear pain or sore throat. Denies chest congestion, productive cough or wheezing. Denies chest pains, palpitations and leg swelling Denies abdominal pain, nausea, vomiting,diarrhea or constipation.   Denies dysuria, frequency, hesitancy or incontinence.  Denies headaches, seizures, numbness, or tingling. Denies depression, anxiety or insomnia. Denies skin break down or rash.        Objective:   Physical Exam Patient alert and oriented and in no cardiopulmonary distress.  HEENT: No facial asymmetry, EOMI, no sinus tenderness,  oropharynx pink and moist.  Neck decreased ROM, no adenopathy.  Chest: Clear to auscultation bilaterally.  CVS: S1, S2 no murmurs, no S3.  ABD: Soft non tender. Bowel sounds normal.  Ext: No edema  [id number]   ROM spine, shoulders, hips and knees.  Skin: Intact, no ulcerations or rash noted.  Psych: Good eye contact, normal affect. Memory intact not anxious or depressed appearing.  CNS: CN 2-12 intact, power, tone and sensation normal throughout.        Assessment & Plan:

## 2011-10-29 NOTE — Assessment & Plan Note (Signed)
Controlled on current med  

## 2011-11-06 DIAGNOSIS — J210 Acute bronchiolitis due to respiratory syncytial virus: Secondary | ICD-10-CM | POA: Diagnosis not present

## 2011-11-06 DIAGNOSIS — J392 Other diseases of pharynx: Secondary | ICD-10-CM | POA: Diagnosis not present

## 2011-11-06 DIAGNOSIS — J069 Acute upper respiratory infection, unspecified: Secondary | ICD-10-CM | POA: Diagnosis not present

## 2011-11-08 ENCOUNTER — Ambulatory Visit (HOSPITAL_COMMUNITY)
Admission: RE | Admit: 2011-11-08 | Discharge: 2011-11-08 | Disposition: A | Discharge status: Home / Self Care | Payer: Medicare Other | Source: Ambulatory Visit | Attending: Cardiovascular Disease | Admitting: Cardiovascular Disease

## 2011-11-08 DIAGNOSIS — M5137 Other intervertebral disc degeneration, lumbosacral region: Secondary | ICD-10-CM | POA: Insufficient documentation

## 2011-11-08 DIAGNOSIS — M51379 Other intervertebral disc degeneration, lumbosacral region without mention of lumbar back pain or lower extremity pain: Secondary | ICD-10-CM | POA: Diagnosis not present

## 2011-11-08 DIAGNOSIS — M169 Osteoarthritis of hip, unspecified: Secondary | ICD-10-CM | POA: Diagnosis not present

## 2011-11-08 DIAGNOSIS — M47817 Spondylosis without myelopathy or radiculopathy, lumbosacral region: Secondary | ICD-10-CM | POA: Diagnosis not present

## 2011-11-08 DIAGNOSIS — M545 Low back pain, unspecified: Secondary | ICD-10-CM | POA: Diagnosis not present

## 2011-11-08 DIAGNOSIS — M1610 Unilateral primary osteoarthritis, unspecified hip: Secondary | ICD-10-CM | POA: Diagnosis not present

## 2011-11-08 DIAGNOSIS — R52 Pain, unspecified: Secondary | ICD-10-CM

## 2011-11-08 DIAGNOSIS — M48061 Spinal stenosis, lumbar region without neurogenic claudication: Secondary | ICD-10-CM | POA: Insufficient documentation

## 2011-11-08 DIAGNOSIS — M25559 Pain in unspecified hip: Secondary | ICD-10-CM | POA: Insufficient documentation

## 2011-11-08 DIAGNOSIS — M5126 Other intervertebral disc displacement, lumbar region: Secondary | ICD-10-CM | POA: Diagnosis not present

## 2011-11-17 DIAGNOSIS — I359 Nonrheumatic aortic valve disorder, unspecified: Secondary | ICD-10-CM | POA: Diagnosis not present

## 2011-11-20 DIAGNOSIS — J1289 Other viral pneumonia: Secondary | ICD-10-CM | POA: Diagnosis not present

## 2011-11-23 DIAGNOSIS — J210 Acute bronchiolitis due to respiratory syncytial virus: Secondary | ICD-10-CM | POA: Diagnosis not present

## 2011-12-05 DIAGNOSIS — H02839 Dermatochalasis of unspecified eye, unspecified eyelid: Secondary | ICD-10-CM | POA: Diagnosis not present

## 2011-12-05 DIAGNOSIS — H02539 Eyelid retraction unspecified eye, unspecified lid: Secondary | ICD-10-CM | POA: Diagnosis not present

## 2011-12-05 DIAGNOSIS — H02409 Unspecified ptosis of unspecified eyelid: Secondary | ICD-10-CM | POA: Diagnosis not present

## 2011-12-08 DIAGNOSIS — I359 Nonrheumatic aortic valve disorder, unspecified: Secondary | ICD-10-CM | POA: Diagnosis not present

## 2011-12-21 DIAGNOSIS — M25559 Pain in unspecified hip: Secondary | ICD-10-CM | POA: Diagnosis not present

## 2011-12-21 DIAGNOSIS — M76899 Other specified enthesopathies of unspecified lower limb, excluding foot: Secondary | ICD-10-CM | POA: Diagnosis not present

## 2011-12-21 DIAGNOSIS — M25569 Pain in unspecified knee: Secondary | ICD-10-CM | POA: Diagnosis not present

## 2011-12-28 DIAGNOSIS — E119 Type 2 diabetes mellitus without complications: Secondary | ICD-10-CM | POA: Diagnosis not present

## 2011-12-28 DIAGNOSIS — E1149 Type 2 diabetes mellitus with other diabetic neurological complication: Secondary | ICD-10-CM | POA: Diagnosis not present

## 2011-12-29 DIAGNOSIS — I359 Nonrheumatic aortic valve disorder, unspecified: Secondary | ICD-10-CM | POA: Diagnosis not present

## 2011-12-31 IMAGING — US US BREAST*L*
1 series · 8 of 8 positions shown · non-contrast
Comparison: Left breast ultrasound 08/17/2010, left mammogram
08/17/2010, bilateral mammogram 02/08/2010, bilateral mammogram
01/27/2009.

CLINICAL DATA: Probable papilloma subareolar left breast.  Surgery
has been recommended, but due to cardiac tissues, the patient is
not felt to be a surgical candidate.  Today, she denies any left
breast nipple discharge.  She does not have any new areas of
concern.

DIGITAL DIAGNOSTIC BILATERAL MAMMOGRAM WITH CAD AND LEFT BREAST
ULTRASOUND:

[Series 1: us breast*left* · 0.06mm/px · 8 of 8 slices shown]
[im 1/8]
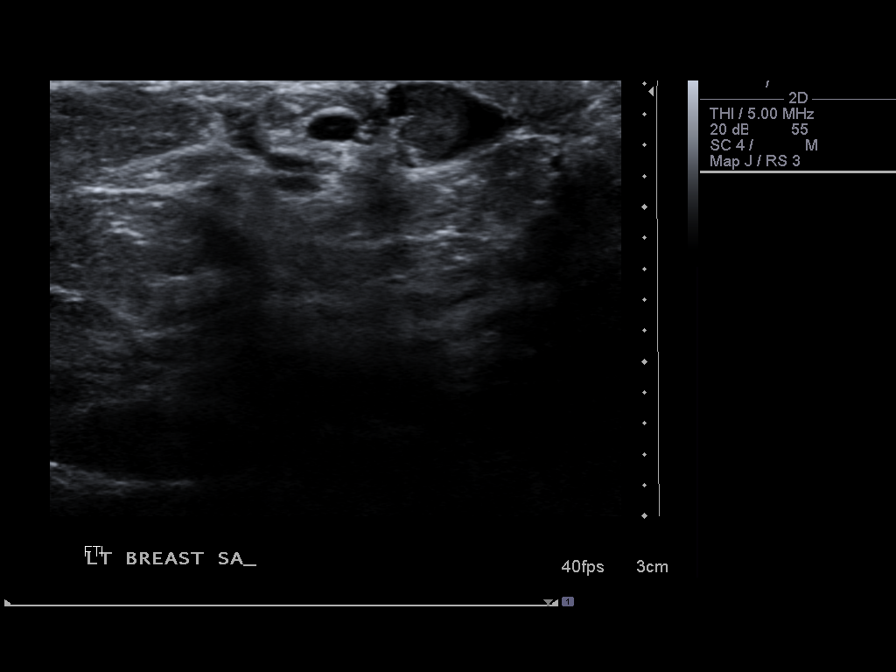
[im 2/8]
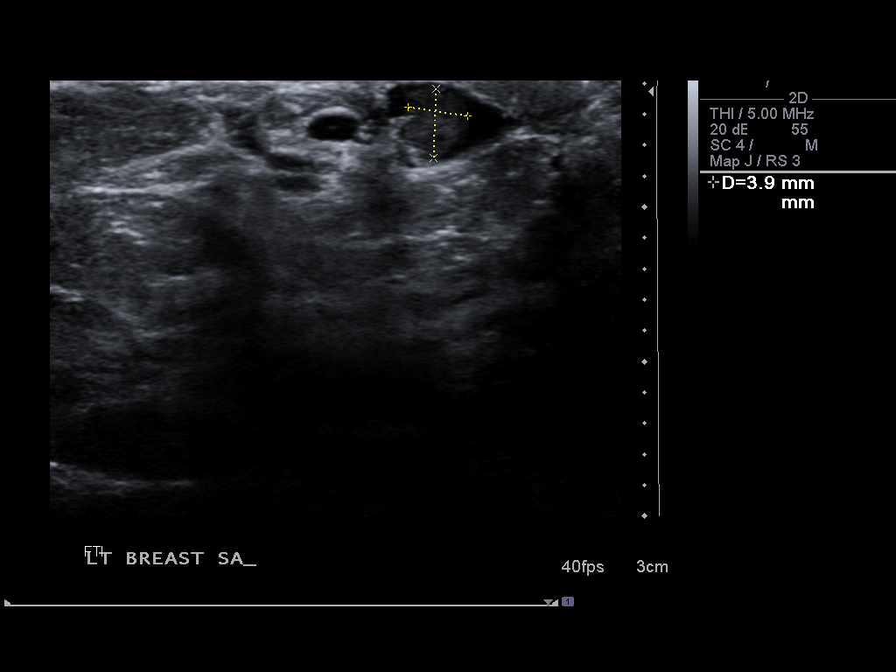
[im 3/8]
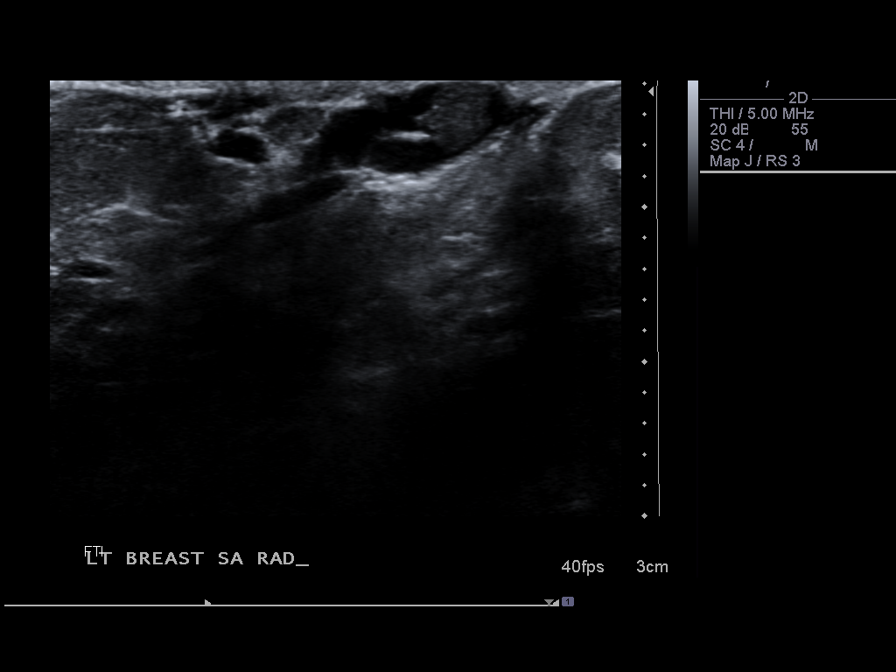
[im 4/8]
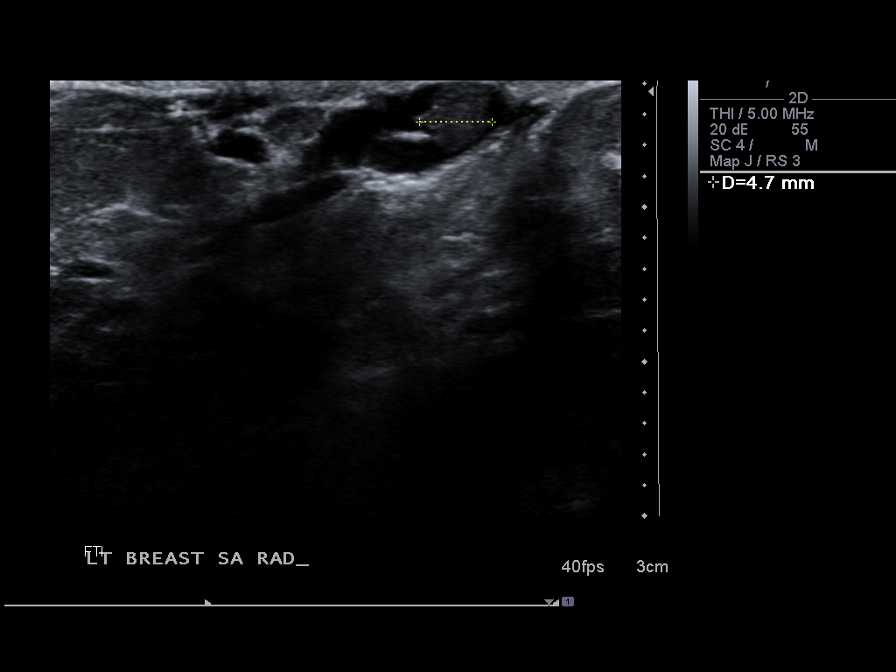
[im 5/8]
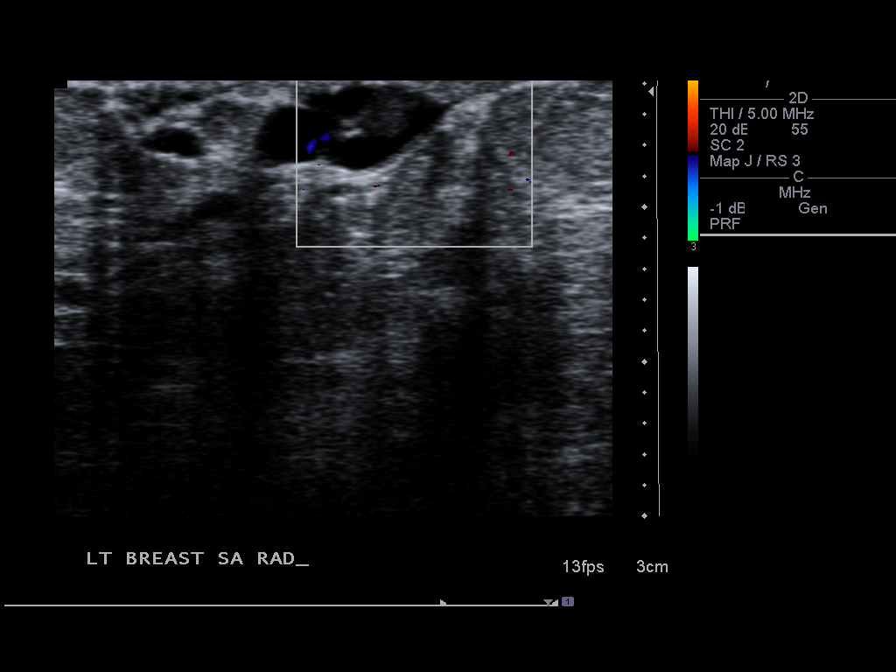
[im 6/8]
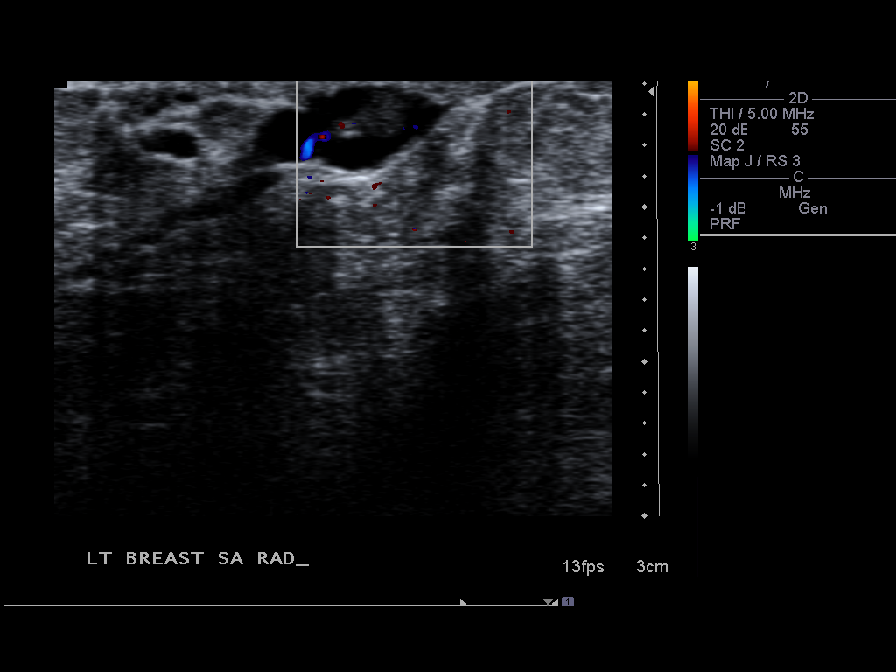
[im 7/8]
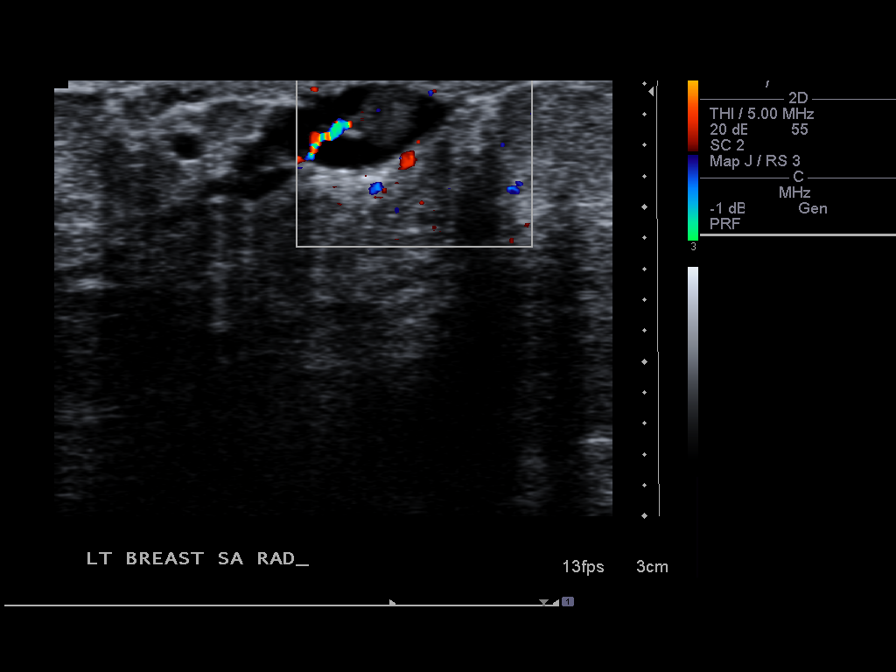
[im 8/8]
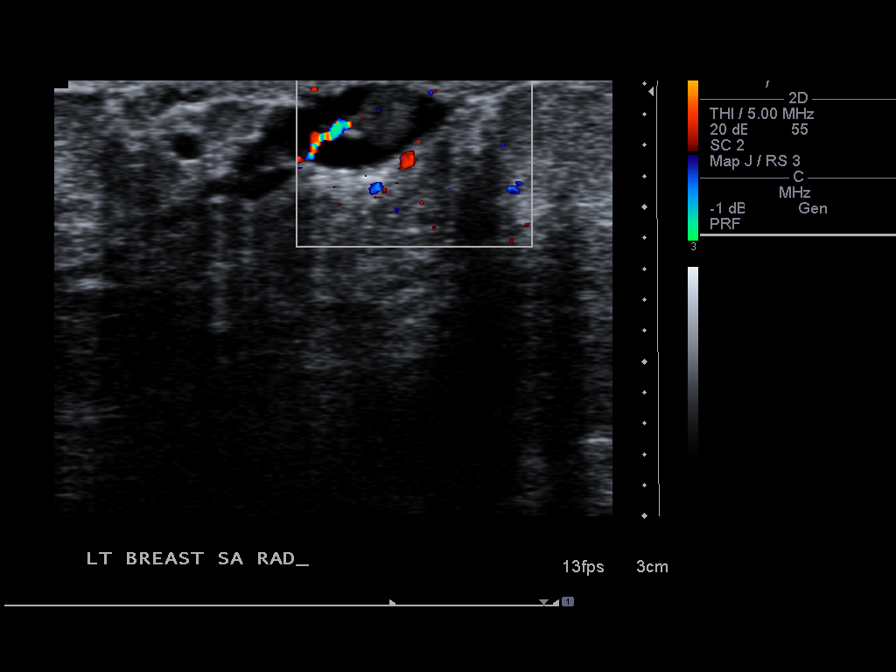

[8 of 8 positions shown; findings below may reference images not displayed]

FINDINGS: There are scattered fibroglandular densities
bilaterally.  Nodular parenchymal pattern in the right breast is
stable.  There are stable benign-appearing calcifications
bilaterally.  No new or suspicious mass is identified in either
breast.  Pacemaker projects over the left breast superiorly.

Mammographic images were processed with CAD.

On physical exam, no mass is palpated in the subareolar left
breast.

Ultrasound is performed, showing an intraductal soft tissue nodule
in the subareolar left breast that measures a maximal diameter of 5
mm.  A vascular stalk feeding this nodule is again identified.
Findings are most consistent with an intraductal papilloma.  It is
stable in size.
IMPRESSION: Stable size and appearance of probable intraductal papilloma
subareolar left breast.  No new areas of concern are identified in
either breast to suggest malignancy.

She has seen Dr. Dorka in surgical consultation previously.
Bilateral diagnostic mammogram in 1 year is suggested, unless new
areas of concern warrant earlier evaluation.

BI-RADS CATEGORY 4:  Suspicious abnormality - biopsy should be
considered.

## 2012-01-07 ENCOUNTER — Other Ambulatory Visit (HOSPITAL_COMMUNITY): Payer: Self-pay | Admitting: Oncology

## 2012-01-08 ENCOUNTER — Telehealth (INDEPENDENT_AMBULATORY_CARE_PROVIDER_SITE_OTHER): Payer: Self-pay | Admitting: Surgery

## 2012-01-08 DIAGNOSIS — N6452 Nipple discharge: Secondary | ICD-10-CM

## 2012-01-09 NOTE — Telephone Encounter (Signed)
Order placed and taken to referral coordinator to schedule.

## 2012-01-10 DIAGNOSIS — H40029 Open angle with borderline findings, high risk, unspecified eye: Secondary | ICD-10-CM | POA: Diagnosis not present

## 2012-01-11 DIAGNOSIS — I359 Nonrheumatic aortic valve disorder, unspecified: Secondary | ICD-10-CM | POA: Diagnosis not present

## 2012-01-18 DIAGNOSIS — Z79899 Other long term (current) drug therapy: Secondary | ICD-10-CM | POA: Diagnosis not present

## 2012-01-18 DIAGNOSIS — E559 Vitamin D deficiency, unspecified: Secondary | ICD-10-CM | POA: Diagnosis not present

## 2012-01-18 DIAGNOSIS — D649 Anemia, unspecified: Secondary | ICD-10-CM | POA: Diagnosis not present

## 2012-01-18 DIAGNOSIS — N189 Chronic kidney disease, unspecified: Secondary | ICD-10-CM | POA: Diagnosis not present

## 2012-01-18 DIAGNOSIS — I10 Essential (primary) hypertension: Secondary | ICD-10-CM | POA: Diagnosis not present

## 2012-01-18 DIAGNOSIS — M25569 Pain in unspecified knee: Secondary | ICD-10-CM | POA: Diagnosis not present

## 2012-01-23 DIAGNOSIS — E559 Vitamin D deficiency, unspecified: Secondary | ICD-10-CM | POA: Diagnosis not present

## 2012-01-23 DIAGNOSIS — E1129 Type 2 diabetes mellitus with other diabetic kidney complication: Secondary | ICD-10-CM | POA: Diagnosis not present

## 2012-01-23 DIAGNOSIS — N183 Chronic kidney disease, stage 3 unspecified: Secondary | ICD-10-CM | POA: Diagnosis not present

## 2012-01-23 DIAGNOSIS — E1165 Type 2 diabetes mellitus with hyperglycemia: Secondary | ICD-10-CM | POA: Diagnosis not present

## 2012-01-31 ENCOUNTER — Other Ambulatory Visit: Payer: Self-pay | Admitting: Family Medicine

## 2012-02-02 ENCOUNTER — Other Ambulatory Visit: Payer: Self-pay | Admitting: Family Medicine

## 2012-02-05 ENCOUNTER — Other Ambulatory Visit: Payer: Self-pay

## 2012-02-05 ENCOUNTER — Telehealth: Payer: Self-pay

## 2012-02-05 MED ORDER — FLUCONAZOLE 150 MG PO TABS: 150.0000 mg | ORAL_TABLET | Freq: Once | ORAL | Status: AC

## 2012-02-05 NOTE — Telephone Encounter (Signed)
States [facility] has been faxing since last week on diflucan. Said she needs one because she thinks she has a yeast infection. Needs the brand names beause generic doesn't do any good

## 2012-02-05 NOTE — Telephone Encounter (Signed)
pls call in non generic diflucan 150mg  #1 tablet only and let pt know

## 2012-02-05 NOTE — Telephone Encounter (Signed)
Med sent.

## 2012-02-08 DIAGNOSIS — I359 Nonrheumatic aortic valve disorder, unspecified: Secondary | ICD-10-CM | POA: Diagnosis not present

## 2012-02-13 ENCOUNTER — Other Ambulatory Visit: Payer: Self-pay

## 2012-02-13 MED ORDER — DIGOXIN 125 MCG PO TABS: 125.0000 ug | ORAL_TABLET | Freq: Every day | ORAL | Status: DC

## 2012-02-14 DIAGNOSIS — E119 Type 2 diabetes mellitus without complications: Secondary | ICD-10-CM | POA: Diagnosis not present

## 2012-02-14 DIAGNOSIS — E039 Hypothyroidism, unspecified: Secondary | ICD-10-CM | POA: Diagnosis not present

## 2012-02-14 DIAGNOSIS — E785 Hyperlipidemia, unspecified: Secondary | ICD-10-CM | POA: Diagnosis not present

## 2012-02-14 LAB — COMPLETE METABOLIC PANEL WITH GFR
ALT: 10 U/L (ref 0–35)
AST: 23 U/L (ref 0–37)
Albumin: 4.4 g/dL (ref 3.5–5.2)
Alkaline Phosphatase: 39 U/L (ref 39–117)
BUN: 38 mg/dL — ABNORMAL HIGH (ref 6–23)
CO2: 29 mEq/L (ref 19–32)
Calcium: 9.8 mg/dL (ref 8.4–10.5)
Chloride: 95 mEq/L — ABNORMAL LOW (ref 96–112)
Creat: 1.67 mg/dL — ABNORMAL HIGH (ref 0.50–1.10)
GFR, Est African American: 34 mL/min — ABNORMAL LOW
GFR, Est Non African American: 29 mL/min — ABNORMAL LOW
Glucose, Bld: 96 mg/dL (ref 70–99)
Potassium: 4.5 mEq/L (ref 3.5–5.3)
Sodium: 131 mEq/L — ABNORMAL LOW (ref 135–145)
Total Bilirubin: 0.4 mg/dL (ref 0.3–1.2)
Total Protein: 7.6 g/dL (ref 6.0–8.3)

## 2012-02-14 LAB — LIPID PANEL
Cholesterol: 120 mg/dL (ref 0–200)
HDL: 29 mg/dL — ABNORMAL LOW (ref >39)
LDL Cholesterol: 57 mg/dL (ref 0–99)
Total CHOL/HDL Ratio: 4.1 Ratio
Triglycerides: 172 mg/dL — ABNORMAL HIGH (ref <150)
VLDL: 34 mg/dL (ref 0–40)

## 2012-02-14 LAB — HEMOGLOBIN A1C
Hgb A1c MFr Bld: 6 % — ABNORMAL HIGH (ref <5.7)
Mean Plasma Glucose: 126 mg/dL — ABNORMAL HIGH (ref <117)

## 2012-02-14 LAB — TSH: TSH: 1.608 u[IU]/mL (ref 0.350–4.500)

## 2012-02-15 DIAGNOSIS — M25569 Pain in unspecified knee: Secondary | ICD-10-CM | POA: Diagnosis not present

## 2012-02-22 ENCOUNTER — Ambulatory Visit (INDEPENDENT_AMBULATORY_CARE_PROVIDER_SITE_OTHER): Payer: Medicare Other | Admitting: Family Medicine

## 2012-02-22 ENCOUNTER — Encounter: Payer: Self-pay | Admitting: Family Medicine

## 2012-02-22 VITALS — BP 124/72 | HR 85 | Resp 18 | Ht 68.0 in | Wt 243.1 lb

## 2012-02-22 DIAGNOSIS — K219 Gastro-esophageal reflux disease without esophagitis: Secondary | ICD-10-CM

## 2012-02-22 DIAGNOSIS — I10 Essential (primary) hypertension: Secondary | ICD-10-CM | POA: Diagnosis not present

## 2012-02-22 DIAGNOSIS — E611 Iron deficiency: Secondary | ICD-10-CM

## 2012-02-22 DIAGNOSIS — E119 Type 2 diabetes mellitus without complications: Secondary | ICD-10-CM | POA: Diagnosis not present

## 2012-02-22 DIAGNOSIS — E039 Hypothyroidism, unspecified: Secondary | ICD-10-CM | POA: Diagnosis not present

## 2012-02-22 DIAGNOSIS — D509 Iron deficiency anemia, unspecified: Secondary | ICD-10-CM | POA: Diagnosis not present

## 2012-02-22 DIAGNOSIS — E785 Hyperlipidemia, unspecified: Secondary | ICD-10-CM

## 2012-02-22 MED ORDER — FUROSEMIDE 40 MG PO TABS: 40.0000 mg | ORAL_TABLET | Freq: Every day | ORAL | Status: DC

## 2012-02-22 MED ORDER — METOPROLOL SUCCINATE ER 100 MG PO TB24: 100.0000 mg | ORAL_TABLET | Freq: Every day | ORAL | Status: DC

## 2012-02-22 MED ORDER — POTASSIUM CHLORIDE CRYS ER 20 MEQ PO TBCR: EXTENDED_RELEASE_TABLET | ORAL | Status: DC

## 2012-02-22 MED ORDER — LANOXIN 125 MCG PO TABS: 125.0000 ug | ORAL_TABLET | Freq: Every day | ORAL | Status: DC

## 2012-02-22 MED ORDER — ISOSORB DINITRATE-HYDRALAZINE 20-37.5 MG PO TABS: 1.0000 | ORAL_TABLET | Freq: Three times a day (TID) | ORAL | Status: DC

## 2012-02-22 MED ORDER — WARFARIN SODIUM 5 MG PO TABS: 5.0000 mg | ORAL_TABLET | Freq: Every day | ORAL | Status: DC

## 2012-02-22 MED ORDER — LEVOTHYROXINE SODIUM 75 MCG PO TABS: 75.0000 ug | ORAL_TABLET | Freq: Every day | ORAL | Status: DC

## 2012-02-22 MED ORDER — GLIPIZIDE ER 2.5 MG PO TB24: ORAL_TABLET | ORAL | Status: DC

## 2012-02-22 NOTE — Patient Instructions (Signed)
F/u in 3 .5 month   REDUCE GLIPIZIDE to oNE tablet EVERY OTHER DAY PLEASE  No change in thyroid medication dose.Marland Kitchen  Please make and keep appointment for eye exam   You will get a script in hand for brand only diflucan #1 tablet only  Chem 7, HBA1C andTSH before next visit  You will get a printout of your labs as requested  Sodium was a little low when checked last week, you need to increase your salt intake just a LITTLE bit

## 2012-02-22 NOTE — Assessment & Plan Note (Signed)
Overcorrected, reduce glipize to alternate day

## 2012-02-22 NOTE — Progress Notes (Signed)
  Subjective:    Patient ID: [name] [name], female    DOB: [birth year], 78 y.o.   MRN: [id number]  HPI The PT is here for follow up and re-evaluation of chronic medical conditions, medication management and review of any available recent lab and radiology data.  Preventive health is updated, specifically  Cancer screening and Immunization.   . The PT denies any adverse reactions to current medications since the last visit.  C/o chronic fatigue, denies chest pain or palpitations. Denies uncontrolled depression or anxiety. Denies any recent fever or  Continues to experience back and hip pain , she is treated for this by orthopedics. Has chronuc nausea,has cholelithiasis , but is a high risk surgical candidate. Expresses concern about breast pathology, "hopes it is not cancer' I assure her that if the feeling was based on medical info available that it was malignant, then she would be referred for definitive treatment Fasting sugars are seldom over 120, denies polyuria, polydipsia or hypoglycemic episodes      Review of Systems See HPI Denies recent fever or chills. Denies sinus pressure, nasal congestion, ear pain or sore throat. Denies chest congestion, productive cough or wheezing. Denies chest pains, palpitations and leg swelling Denies  vomiting,diarrhea or constipation.   Denies dysuria, frequency, hesitancy or incontinence.  Denies headaches, seizures, numbness, or tingling. Denies depression, anxiety or insomnia. Denies skin break down or rash.        Objective:   Physical Exam  Patient alert and oriented and in no cardiopulmonary distress.  HEENT: No facial asymmetry, EOMI, no sinus tenderness,  oropharynx pink and moist.  Neck supple no adenopathy.No JVD  Chest: Clear to auscultation bilaterally.  CVS: S1, S2 click and systolic  murmur, no S3.  ABD: Soft non tender. Bowel sounds normal.  Ext: No edema  MS: decreased  ROM spine, shoulders, hips and  knees.  Skin: Intact, no ulcerations or rash noted.  Psych: Good eye contact, normal affect. Memory intact mildly  anxious not depressed appearing.  CNS: CN 2-12 intact, power, tone and sensation normal throughout.       Assessment & Plan:

## 2012-02-26 NOTE — Assessment & Plan Note (Signed)
Triglycerides elevated and HDL low. No med change, and pt advised to reduce cheese , fried and fatty foods

## 2012-02-26 NOTE — Assessment & Plan Note (Signed)
Controlled, no change in medication  

## 2012-02-26 NOTE — Assessment & Plan Note (Signed)
Continue med has chronic nausea, likely due to gallstones

## 2012-02-26 NOTE — Assessment & Plan Note (Signed)
Followed by hematology, getting parenteral iron

## 2012-02-27 DIAGNOSIS — I739 Peripheral vascular disease, unspecified: Secondary | ICD-10-CM | POA: Diagnosis not present

## 2012-02-27 DIAGNOSIS — I509 Heart failure, unspecified: Secondary | ICD-10-CM | POA: Diagnosis not present

## 2012-02-27 DIAGNOSIS — I359 Nonrheumatic aortic valve disorder, unspecified: Secondary | ICD-10-CM | POA: Diagnosis not present

## 2012-02-27 DIAGNOSIS — Z4502 Encounter for adjustment and management of automatic implantable cardiac defibrillator: Secondary | ICD-10-CM | POA: Diagnosis not present

## 2012-02-28 ENCOUNTER — Other Ambulatory Visit (INDEPENDENT_AMBULATORY_CARE_PROVIDER_SITE_OTHER): Payer: Self-pay | Admitting: Surgery

## 2012-02-28 ENCOUNTER — Ambulatory Visit (HOSPITAL_COMMUNITY)
Admission: RE | Admit: 2012-02-28 | Discharge: 2012-02-28 | Disposition: A | Discharge status: Home / Self Care | Payer: Medicare Other | Source: Ambulatory Visit | Attending: Surgery | Admitting: Surgery

## 2012-02-28 DIAGNOSIS — N6459 Other signs and symptoms in breast: Secondary | ICD-10-CM | POA: Diagnosis not present

## 2012-02-28 DIAGNOSIS — D249 Benign neoplasm of unspecified breast: Secondary | ICD-10-CM | POA: Diagnosis not present

## 2012-02-28 DIAGNOSIS — N6452 Nipple discharge: Secondary | ICD-10-CM

## 2012-03-04 ENCOUNTER — Other Ambulatory Visit: Payer: Self-pay

## 2012-03-04 MED ORDER — GLUCOSE BLOOD VI STRP: ORAL_STRIP | Status: DC

## 2012-03-06 ENCOUNTER — Encounter (INDEPENDENT_AMBULATORY_CARE_PROVIDER_SITE_OTHER): Payer: Self-pay | Admitting: General Surgery

## 2012-03-06 DIAGNOSIS — D249 Benign neoplasm of unspecified breast: Secondary | ICD-10-CM

## 2012-03-06 HISTORY — DX: Benign neoplasm of unspecified breast: D24.9

## 2012-03-07 ENCOUNTER — Encounter (INDEPENDENT_AMBULATORY_CARE_PROVIDER_SITE_OTHER): Payer: Self-pay | Admitting: Surgery

## 2012-03-07 ENCOUNTER — Ambulatory Visit (INDEPENDENT_AMBULATORY_CARE_PROVIDER_SITE_OTHER): Payer: Medicare Other | Admitting: Surgery

## 2012-03-07 VITALS — BP 107/40 | HR 92 | Ht 67.0 in | Wt 234.2 lb

## 2012-03-07 DIAGNOSIS — D249 Benign neoplasm of unspecified breast: Secondary | ICD-10-CM

## 2012-03-07 NOTE — Patient Instructions (Signed)
Continue annual mammograms and let us see you again in a year.

## 2012-03-07 NOTE — Progress Notes (Signed)
  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 She has multiple medical issues, particularly cardiac area therefore not perform surgery but elected to follow her. She notes that she has not noticed a spontaneous discharge for the last several months. She has noticed no change in her breasts.   ROS: Recent cardiac workup showed stability  PE General: The patient is alert oriented and healthy-appearing Vital signs: Breasts: The breasts are symmetric in appearance. There not tender. There is no palpable mass on either side. There no visible skin, nipple, or areolar changes noted. I can elicit clear nipple discharge from both breasts. I cannot palpate any subareolar mass on either side. Lymphatics: There is no axillary or supraclavicular adenopathy noted.  Data Reviewed I have reviewed her recent mammogram and ultrasound reports. There continues to be a 5 mm mass in a duct on the left side completely stable from prior exams.  Assessment 1. Bilateral nipple discharge with likely intraductal papilloma on the left, stable 2. Significant medical issues including significant cardiac risk factors  Plan I think our original plan is best, that is continued followup in annual mammograms with surgery only if there is a change in the character and amount of discharge or change in the very stable lesions in the breast consistent with papillomas.

## 2012-03-11 ENCOUNTER — Other Ambulatory Visit: Payer: Self-pay

## 2012-03-11 ENCOUNTER — Encounter (HOSPITAL_COMMUNITY): Payer: Medicare Other | Attending: Oncology

## 2012-03-11 DIAGNOSIS — E7800 Pure hypercholesterolemia, unspecified: Secondary | ICD-10-CM | POA: Insufficient documentation

## 2012-03-11 DIAGNOSIS — D594 Other nonautoimmune hemolytic anemias: Secondary | ICD-10-CM | POA: Insufficient documentation

## 2012-03-11 DIAGNOSIS — E119 Type 2 diabetes mellitus without complications: Secondary | ICD-10-CM | POA: Insufficient documentation

## 2012-03-11 DIAGNOSIS — R161 Splenomegaly, not elsewhere classified: Secondary | ICD-10-CM | POA: Diagnosis not present

## 2012-03-11 DIAGNOSIS — D638 Anemia in other chronic diseases classified elsewhere: Secondary | ICD-10-CM | POA: Diagnosis not present

## 2012-03-11 DIAGNOSIS — D599 Acquired hemolytic anemia, unspecified: Secondary | ICD-10-CM

## 2012-03-11 DIAGNOSIS — D589 Hereditary hemolytic anemia, unspecified: Secondary | ICD-10-CM

## 2012-03-11 DIAGNOSIS — E611 Iron deficiency: Secondary | ICD-10-CM

## 2012-03-11 DIAGNOSIS — Z954 Presence of other heart-valve replacement: Secondary | ICD-10-CM | POA: Insufficient documentation

## 2012-03-11 LAB — CBC
HCT: 33.4 % — ABNORMAL LOW (ref 36.0–46.0)
Hemoglobin: 10.7 g/dL — ABNORMAL LOW (ref 12.0–15.0)
MCH: 30.3 pg (ref 26.0–34.0)
MCHC: 32 g/dL (ref 30.0–36.0)
MCV: 94.6 fL (ref 78.0–100.0)
Platelets: 248 10*3/uL (ref 150–400)
RBC: 3.53 MIL/uL — ABNORMAL LOW (ref 3.87–5.11)
RDW: 14.5 % (ref 11.5–15.5)
WBC: 5.3 10*3/uL (ref 4.0–10.5)

## 2012-03-11 LAB — RETICULOCYTES
RBC.: 3.53 MIL/uL — ABNORMAL LOW (ref 3.87–5.11)
Retic Count, Absolute: 91.8 10*3/uL (ref 19.0–186.0)
Retic Ct Pct: 2.6 % (ref 0.4–3.1)

## 2012-03-11 LAB — HAPTOGLOBIN: Haptoglobin: <25 mg/dL — ABNORMAL LOW (ref 45–215)

## 2012-03-11 LAB — IRON AND TIBC
Iron: 88 ug/dL (ref 42–135)
Saturation Ratios: 20 % (ref 20–55)
TIBC: 436 ug/dL (ref 250–470)
UIBC: 348 ug/dL (ref 125–400)

## 2012-03-11 LAB — LACTATE DEHYDROGENASE: LDH: 276 U/L — ABNORMAL HIGH (ref 94–250)

## 2012-03-11 LAB — FERRITIN: Ferritin: 874 ng/mL — ABNORMAL HIGH (ref 10–291)

## 2012-03-11 MED ORDER — TOPROL XL 100 MG PO TB24: 100.0000 mg | ORAL_TABLET | Freq: Every day | ORAL | Status: DC

## 2012-03-11 NOTE — Progress Notes (Signed)
Labs drawn today for cbc,retic,hapt,ldh,Iron and IBC,ferr

## 2012-03-12 ENCOUNTER — Encounter (HOSPITAL_BASED_OUTPATIENT_CLINIC_OR_DEPARTMENT_OTHER): Payer: Medicare Other | Admitting: Oncology

## 2012-03-12 ENCOUNTER — Encounter (HOSPITAL_COMMUNITY): Payer: Self-pay | Admitting: Oncology

## 2012-03-12 VITALS — BP 110/58 | HR 72 | Temp 97.0°F | Wt 242.6 lb

## 2012-03-12 DIAGNOSIS — D594 Other nonautoimmune hemolytic anemias: Secondary | ICD-10-CM | POA: Diagnosis not present

## 2012-03-12 DIAGNOSIS — D649 Anemia, unspecified: Secondary | ICD-10-CM

## 2012-03-12 DIAGNOSIS — D638 Anemia in other chronic diseases classified elsewhere: Secondary | ICD-10-CM

## 2012-03-12 NOTE — Progress Notes (Signed)
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 Osborn Coho had his blood counts are very stable she has a hemoglobin greater than 10 g. Her haptoglobin is still less than normal. And a reticulocyte count is usually in the 60-110,000 range. Her ferritin is normal I will check a [id number] and folic acid level because of her increased red cell turnover. They were normal last year appear she takes at folic acid vitamin every day but does not take B12 due to "intolerance"  I will check those levels and see her back in 6 months with repeat lab. She needs occasional IV iron infusion which we also need to monitor her for.

## 2012-03-12 NOTE — Patient Instructions (Signed)
[name] [name]  [id number] 1931-12-19 Dr. Everardo All   [facility]  Discharge Instructions  RECOMMENDATIONS MADE BY THE CONSULTANT AND ANY TEST RESULTS WILL BE SENT TO YOUR REFERRING DOCTOR.   EXAM FINDINGS BY MD TODAY AND SIGNS AND SYMPTOMS TO REPORT TO CLINIC OR PRIMARY MD:   SPECIAL INSTRUCTIONS/FOLLOW-UP: 1.  You are scheduled for labs and another office visit in December, please feel free to contact us in the meantime if you have any questions or concerns regarding your care. 2.  You had labs done today, and we will contact you with any abnormal results.  I acknowledge that I have been informed and understand all the instructions given to me and received a copy. I do not have any more questions at this time, but understand that I may call the [facility] at [facility] at [phone] during business hours should I have any further questions or need assistance in obtaining follow-up care.    __________________________________________  _____________  __________ Signature of Patient or Authorized Representative            Date                   Time    __________________________________________ Nurse's Signature

## 2012-03-13 DIAGNOSIS — D65 Disseminated intravascular coagulation [defibrination syndrome]: Secondary | ICD-10-CM | POA: Diagnosis not present

## 2012-03-13 LAB — VITAMIN B12: Vitamin B-12: 375 pg/mL (ref 211–911)

## 2012-03-13 LAB — FOLATE: Folate: >20 ng/mL

## 2012-03-19 DIAGNOSIS — I70219 Atherosclerosis of native arteries of extremities with intermittent claudication, unspecified extremity: Secondary | ICD-10-CM | POA: Diagnosis not present

## 2012-03-21 ENCOUNTER — Telehealth: Payer: Self-pay | Admitting: Family Medicine

## 2012-03-21 ENCOUNTER — Encounter (HOSPITAL_COMMUNITY): Payer: Self-pay | Admitting: *Deleted

## 2012-03-21 ENCOUNTER — Emergency Department (HOSPITAL_COMMUNITY): Payer: Medicare Other

## 2012-03-21 ENCOUNTER — Inpatient Hospital Stay (HOSPITAL_COMMUNITY)
Admission: EM | Admit: 2012-03-21 | Discharge: 2012-04-02 | DRG: 291 | Disposition: A | Discharge status: Skilled Nursing Facility | Payer: Medicare Other | Attending: Cardiovascular Disease | Admitting: Cardiovascular Disease

## 2012-03-21 ENCOUNTER — Inpatient Hospital Stay (HOSPITAL_COMMUNITY): Payer: Medicare Other

## 2012-03-21 DIAGNOSIS — R040 Epistaxis: Secondary | ICD-10-CM | POA: Diagnosis not present

## 2012-03-21 DIAGNOSIS — M79609 Pain in unspecified limb: Secondary | ICD-10-CM | POA: Diagnosis not present

## 2012-03-21 DIAGNOSIS — G9349 Other encephalopathy: Secondary | ICD-10-CM | POA: Diagnosis not present

## 2012-03-21 DIAGNOSIS — G4700 Insomnia, unspecified: Secondary | ICD-10-CM | POA: Diagnosis present

## 2012-03-21 DIAGNOSIS — D249 Benign neoplasm of unspecified breast: Secondary | ICD-10-CM

## 2012-03-21 DIAGNOSIS — J189 Pneumonia, unspecified organism: Secondary | ICD-10-CM

## 2012-03-21 DIAGNOSIS — N179 Acute kidney failure, unspecified: Secondary | ICD-10-CM

## 2012-03-21 DIAGNOSIS — J9601 Acute respiratory failure with hypoxia: Secondary | ICD-10-CM | POA: Diagnosis present

## 2012-03-21 DIAGNOSIS — R262 Difficulty in walking, not elsewhere classified: Secondary | ICD-10-CM | POA: Diagnosis not present

## 2012-03-21 DIAGNOSIS — R918 Other nonspecific abnormal finding of lung field: Secondary | ICD-10-CM | POA: Diagnosis not present

## 2012-03-21 DIAGNOSIS — R0603 Acute respiratory distress: Secondary | ICD-10-CM

## 2012-03-21 DIAGNOSIS — E876 Hypokalemia: Secondary | ICD-10-CM | POA: Diagnosis not present

## 2012-03-21 DIAGNOSIS — I2489 Other forms of acute ischemic heart disease: Secondary | ICD-10-CM

## 2012-03-21 DIAGNOSIS — S139XXA Sprain of joints and ligaments of unspecified parts of neck, initial encounter: Secondary | ICD-10-CM

## 2012-03-21 DIAGNOSIS — E039 Hypothyroidism, unspecified: Secondary | ICD-10-CM

## 2012-03-21 DIAGNOSIS — Z7982 Long term (current) use of aspirin: Secondary | ICD-10-CM | POA: Diagnosis not present

## 2012-03-21 DIAGNOSIS — Z8701 Personal history of pneumonia (recurrent): Secondary | ICD-10-CM | POA: Diagnosis not present

## 2012-03-21 DIAGNOSIS — I248 Other forms of acute ischemic heart disease: Secondary | ICD-10-CM

## 2012-03-21 DIAGNOSIS — J9600 Acute respiratory failure, unspecified whether with hypoxia or hypercapnia: Secondary | ICD-10-CM

## 2012-03-21 DIAGNOSIS — Z95810 Presence of automatic (implantable) cardiac defibrillator: Secondary | ICD-10-CM

## 2012-03-21 DIAGNOSIS — Z91199 Patient's noncompliance with other medical treatment and regimen due to unspecified reason: Secondary | ICD-10-CM | POA: Diagnosis not present

## 2012-03-21 DIAGNOSIS — M545 Low back pain, unspecified: Secondary | ICD-10-CM

## 2012-03-21 DIAGNOSIS — Z87891 Personal history of nicotine dependence: Secondary | ICD-10-CM

## 2012-03-21 DIAGNOSIS — R0902 Hypoxemia: Secondary | ICD-10-CM | POA: Diagnosis present

## 2012-03-21 DIAGNOSIS — I359 Nonrheumatic aortic valve disorder, unspecified: Secondary | ICD-10-CM | POA: Diagnosis not present

## 2012-03-21 DIAGNOSIS — R5381 Other malaise: Secondary | ICD-10-CM

## 2012-03-21 DIAGNOSIS — N184 Chronic kidney disease, stage 4 (severe): Secondary | ICD-10-CM

## 2012-03-21 DIAGNOSIS — R0602 Shortness of breath: Secondary | ICD-10-CM | POA: Diagnosis not present

## 2012-03-21 DIAGNOSIS — M6281 Muscle weakness (generalized): Secondary | ICD-10-CM | POA: Diagnosis not present

## 2012-03-21 DIAGNOSIS — I1310 Hypertensive heart and chronic kidney disease without heart failure, with stage 1 through stage 4 chronic kidney disease, or unspecified chronic kidney disease: Secondary | ICD-10-CM

## 2012-03-21 DIAGNOSIS — R110 Nausea: Secondary | ICD-10-CM

## 2012-03-21 DIAGNOSIS — D649 Anemia, unspecified: Secondary | ICD-10-CM | POA: Diagnosis not present

## 2012-03-21 DIAGNOSIS — IMO0001 Reserved for inherently not codable concepts without codable children: Secondary | ICD-10-CM | POA: Diagnosis not present

## 2012-03-21 DIAGNOSIS — Z954 Presence of other heart-valve replacement: Secondary | ICD-10-CM | POA: Diagnosis not present

## 2012-03-21 DIAGNOSIS — M109 Gout, unspecified: Secondary | ICD-10-CM | POA: Diagnosis not present

## 2012-03-21 DIAGNOSIS — N181 Chronic kidney disease, stage 1: Secondary | ICD-10-CM | POA: Diagnosis not present

## 2012-03-21 DIAGNOSIS — E785 Hyperlipidemia, unspecified: Secondary | ICD-10-CM

## 2012-03-21 DIAGNOSIS — G589 Mononeuropathy, unspecified: Secondary | ICD-10-CM | POA: Diagnosis not present

## 2012-03-21 DIAGNOSIS — E669 Obesity, unspecified: Secondary | ICD-10-CM | POA: Diagnosis present

## 2012-03-21 DIAGNOSIS — F411 Generalized anxiety disorder: Secondary | ICD-10-CM | POA: Diagnosis not present

## 2012-03-21 DIAGNOSIS — I2510 Atherosclerotic heart disease of native coronary artery without angina pectoris: Secondary | ICD-10-CM

## 2012-03-21 DIAGNOSIS — R7303 Prediabetes: Secondary | ICD-10-CM | POA: Diagnosis present

## 2012-03-21 DIAGNOSIS — Z8249 Family history of ischemic heart disease and other diseases of the circulatory system: Secondary | ICD-10-CM

## 2012-03-21 DIAGNOSIS — K219 Gastro-esophageal reflux disease without esophagitis: Secondary | ICD-10-CM

## 2012-03-21 DIAGNOSIS — N183 Chronic kidney disease, stage 3 unspecified: Secondary | ICD-10-CM

## 2012-03-21 DIAGNOSIS — R778 Other specified abnormalities of plasma proteins: Secondary | ICD-10-CM | POA: Diagnosis present

## 2012-03-21 DIAGNOSIS — Z9119 Patient's noncompliance with other medical treatment and regimen: Secondary | ICD-10-CM

## 2012-03-21 DIAGNOSIS — K8020 Calculus of gallbladder without cholecystitis without obstruction: Secondary | ICD-10-CM

## 2012-03-21 DIAGNOSIS — Z952 Presence of prosthetic heart valve: Secondary | ICD-10-CM

## 2012-03-21 DIAGNOSIS — M479 Spondylosis, unspecified: Secondary | ICD-10-CM

## 2012-03-21 DIAGNOSIS — N189 Chronic kidney disease, unspecified: Secondary | ICD-10-CM | POA: Diagnosis not present

## 2012-03-21 DIAGNOSIS — R079 Chest pain, unspecified: Secondary | ICD-10-CM | POA: Diagnosis not present

## 2012-03-21 DIAGNOSIS — E119 Type 2 diabetes mellitus without complications: Secondary | ICD-10-CM | POA: Diagnosis not present

## 2012-03-21 DIAGNOSIS — M25559 Pain in unspecified hip: Secondary | ICD-10-CM

## 2012-03-21 DIAGNOSIS — I5023 Acute on chronic systolic (congestive) heart failure: Secondary | ICD-10-CM

## 2012-03-21 DIAGNOSIS — J441 Chronic obstructive pulmonary disease with (acute) exacerbation: Secondary | ICD-10-CM | POA: Diagnosis not present

## 2012-03-21 DIAGNOSIS — J984 Other disorders of lung: Secondary | ICD-10-CM | POA: Diagnosis not present

## 2012-03-21 DIAGNOSIS — R509 Fever, unspecified: Secondary | ICD-10-CM | POA: Diagnosis not present

## 2012-03-21 DIAGNOSIS — Z833 Family history of diabetes mellitus: Secondary | ICD-10-CM

## 2012-03-21 DIAGNOSIS — T45515A Adverse effect of anticoagulants, initial encounter: Secondary | ICD-10-CM | POA: Diagnosis not present

## 2012-03-21 DIAGNOSIS — Z7901 Long term (current) use of anticoagulants: Secondary | ICD-10-CM | POA: Diagnosis not present

## 2012-03-21 DIAGNOSIS — I509 Heart failure, unspecified: Secondary | ICD-10-CM

## 2012-03-21 DIAGNOSIS — R791 Abnormal coagulation profile: Secondary | ICD-10-CM | POA: Diagnosis not present

## 2012-03-21 DIAGNOSIS — E611 Iron deficiency: Secondary | ICD-10-CM

## 2012-03-21 DIAGNOSIS — I517 Cardiomegaly: Secondary | ICD-10-CM | POA: Diagnosis not present

## 2012-03-21 DIAGNOSIS — N3000 Acute cystitis without hematuria: Secondary | ICD-10-CM

## 2012-03-21 DIAGNOSIS — R062 Wheezing: Secondary | ICD-10-CM

## 2012-03-21 DIAGNOSIS — I059 Rheumatic mitral valve disease, unspecified: Secondary | ICD-10-CM | POA: Diagnosis not present

## 2012-03-21 DIAGNOSIS — R52 Pain, unspecified: Secondary | ICD-10-CM | POA: Diagnosis not present

## 2012-03-21 DIAGNOSIS — M47817 Spondylosis without myelopathy or radiculopathy, lumbosacral region: Secondary | ICD-10-CM | POA: Diagnosis present

## 2012-03-21 DIAGNOSIS — M79659 Pain in unspecified thigh: Secondary | ICD-10-CM

## 2012-03-21 DIAGNOSIS — Z79899 Other long term (current) drug therapy: Secondary | ICD-10-CM | POA: Diagnosis not present

## 2012-03-21 DIAGNOSIS — E860 Dehydration: Secondary | ICD-10-CM

## 2012-03-21 DIAGNOSIS — R7989 Other specified abnormal findings of blood chemistry: Secondary | ICD-10-CM

## 2012-03-21 DIAGNOSIS — D684 Acquired coagulation factor deficiency: Secondary | ICD-10-CM | POA: Diagnosis not present

## 2012-03-21 DIAGNOSIS — I10 Essential (primary) hypertension: Secondary | ICD-10-CM | POA: Diagnosis not present

## 2012-03-21 DIAGNOSIS — I428 Other cardiomyopathies: Secondary | ICD-10-CM | POA: Diagnosis not present

## 2012-03-21 DIAGNOSIS — N039 Chronic nephritic syndrome with unspecified morphologic changes: Secondary | ICD-10-CM | POA: Diagnosis present

## 2012-03-21 DIAGNOSIS — I130 Hypertensive heart and chronic kidney disease with heart failure and stage 1 through stage 4 chronic kidney disease, or unspecified chronic kidney disease: Principal | ICD-10-CM | POA: Diagnosis present

## 2012-03-21 DIAGNOSIS — G9340 Encephalopathy, unspecified: Secondary | ICD-10-CM

## 2012-03-21 DIAGNOSIS — N6459 Other signs and symptoms in breast: Secondary | ICD-10-CM

## 2012-03-21 DIAGNOSIS — N289 Disorder of kidney and ureter, unspecified: Secondary | ICD-10-CM

## 2012-03-21 DIAGNOSIS — E890 Postprocedural hypothyroidism: Secondary | ICD-10-CM | POA: Diagnosis present

## 2012-03-21 DIAGNOSIS — R51 Headache: Secondary | ICD-10-CM

## 2012-03-21 DIAGNOSIS — Z6835 Body mass index (BMI) 35.0-35.9, adult: Secondary | ICD-10-CM

## 2012-03-21 DIAGNOSIS — R5383 Other fatigue: Secondary | ICD-10-CM

## 2012-03-21 DIAGNOSIS — Z880 Allergy status to penicillin: Secondary | ICD-10-CM

## 2012-03-21 DIAGNOSIS — I83893 Varicose veins of bilateral lower extremities with other complications: Secondary | ICD-10-CM

## 2012-03-21 DIAGNOSIS — J90 Pleural effusion, not elsewhere classified: Secondary | ICD-10-CM | POA: Diagnosis not present

## 2012-03-21 DIAGNOSIS — D6832 Hemorrhagic disorder due to extrinsic circulating anticoagulants: Secondary | ICD-10-CM

## 2012-03-21 LAB — URINALYSIS, ROUTINE W REFLEX MICROSCOPIC
Bilirubin Urine: NEGATIVE
Glucose, UA: NEGATIVE mg/dL
Ketones, ur: NEGATIVE mg/dL
Leukocytes, UA: NEGATIVE
Nitrite: NEGATIVE
Protein, ur: >300 mg/dL — AB
Specific Gravity, Urine: 1.025 (ref 1.005–1.030)
Urobilinogen, UA: 1 mg/dL (ref 0.0–1.0)
pH: 5.5 (ref 5.0–8.0)

## 2012-03-21 LAB — CBC WITH DIFFERENTIAL/PLATELET
Basophils Absolute: 0 10*3/uL (ref 0.0–0.1)
Basophils Relative: 0 % (ref 0–1)
Eosinophils Absolute: 0 10*3/uL (ref 0.0–0.7)
Eosinophils Relative: 0 % (ref 0–5)
HCT: 30.2 % — ABNORMAL LOW (ref 36.0–46.0)
Hemoglobin: 9.7 g/dL — ABNORMAL LOW (ref 12.0–15.0)
Lymphocytes Relative: 4 % — ABNORMAL LOW (ref 12–46)
Lymphs Abs: 0.4 10*3/uL — ABNORMAL LOW (ref 0.7–4.0)
MCH: 29.4 pg (ref 26.0–34.0)
MCHC: 32.1 g/dL (ref 30.0–36.0)
MCV: 91.5 fL (ref 78.0–100.0)
Monocytes Absolute: 0.9 10*3/uL (ref 0.1–1.0)
Monocytes Relative: 8 % (ref 3–12)
Neutro Abs: 10.5 10*3/uL — ABNORMAL HIGH (ref 1.7–7.7)
Neutrophils Relative %: 88 % — ABNORMAL HIGH (ref 43–77)
Platelets: 222 10*3/uL (ref 150–400)
RBC: 3.3 MIL/uL — ABNORMAL LOW (ref 3.87–5.11)
RDW: 14.4 % (ref 11.5–15.5)
WBC: 11.9 10*3/uL — ABNORMAL HIGH (ref 4.0–10.5)

## 2012-03-21 LAB — BASIC METABOLIC PANEL
BUN: 48 mg/dL — ABNORMAL HIGH (ref 6–23)
CO2: 22 mEq/L (ref 19–32)
Calcium: 10.1 mg/dL (ref 8.4–10.5)
Chloride: 96 mEq/L (ref 96–112)
Creatinine, Ser: 2.22 mg/dL — ABNORMAL HIGH (ref 0.50–1.10)
GFR calc Af Amer: 23 mL/min — ABNORMAL LOW (ref >90)
GFR calc non Af Amer: 20 mL/min — ABNORMAL LOW (ref >90)
Glucose, Bld: 157 mg/dL — ABNORMAL HIGH (ref 70–99)
Potassium: 4.6 mEq/L (ref 3.5–5.1)
Sodium: 133 mEq/L — ABNORMAL LOW (ref 135–145)

## 2012-03-21 LAB — MRSA PCR SCREENING: MRSA by PCR: NEGATIVE

## 2012-03-21 LAB — URINE MICROSCOPIC-ADD ON

## 2012-03-21 LAB — BLOOD GAS, ARTERIAL
Acid-Base Excess: 1.4 mmol/L (ref 0.0–2.0)
Bicarbonate: 25.5 mEq/L — ABNORMAL HIGH (ref 20.0–24.0)
Delivery systems: POSITIVE
Drawn by: 22223
Expiratory PAP: 6
FIO2: 30 %
Inspiratory PAP: 12
O2 Saturation: 95.3 %
Patient temperature: 37
TCO2: 23.7 mmol/L (ref 0–100)
pCO2 arterial: 40.5 mmHg (ref 35.0–45.0)
pH, Arterial: 7.415 — ABNORMAL HIGH (ref 7.350–7.400)
pO2, Arterial: 77.5 mmHg — ABNORMAL LOW (ref 80.0–100.0)

## 2012-03-21 LAB — HEPATIC FUNCTION PANEL
ALT: 20 U/L (ref 0–35)
AST: 74 U/L — ABNORMAL HIGH (ref 0–37)
Albumin: 3.6 g/dL (ref 3.5–5.2)
Alkaline Phosphatase: 45 U/L (ref 39–117)
Bilirubin, Direct: 0.6 mg/dL — ABNORMAL HIGH (ref 0.0–0.3)
Indirect Bilirubin: 0.3 mg/dL (ref 0.3–0.9)
Total Bilirubin: 0.9 mg/dL (ref 0.3–1.2)
Total Protein: 8.5 g/dL — ABNORMAL HIGH (ref 6.0–8.3)

## 2012-03-21 LAB — PROCALCITONIN: Procalcitonin: 22.68 ng/mL

## 2012-03-21 LAB — CARDIAC PANEL(CRET KIN+CKTOT+MB+TROPI)
CK, MB: 2.1 ng/mL (ref 0.3–4.0)
Relative Index: 0.3 (ref 0.0–2.5)
Total CK: 713 U/L — ABNORMAL HIGH (ref 7–177)
Troponin I: 0.77 ng/mL (ref <0.30)

## 2012-03-21 LAB — POCT I-STAT TROPONIN I: Troponin i, poc: 0.42 ng/mL (ref 0.00–0.08)

## 2012-03-21 LAB — PROTIME-INR
INR: 4.15 — ABNORMAL HIGH (ref 0.00–1.49)
Prothrombin Time: 40.7 seconds — ABNORMAL HIGH (ref 11.6–15.2)

## 2012-03-21 LAB — PRO B NATRIURETIC PEPTIDE
Pro B Natriuretic peptide (BNP): 34990 pg/mL — ABNORMAL HIGH (ref 0–450)
Pro B Natriuretic peptide (BNP): 29898 pg/mL — ABNORMAL HIGH (ref 0–450)

## 2012-03-21 LAB — TROPONIN I: Troponin I: 0.43 ng/mL (ref <0.30)

## 2012-03-21 LAB — LACTIC ACID, PLASMA: Lactic Acid, Venous: 0.9 mmol/L (ref 0.5–2.2)

## 2012-03-21 LAB — APTT: aPTT: 79 seconds — ABNORMAL HIGH (ref 24–37)

## 2012-03-21 LAB — DIGOXIN LEVEL: Digoxin Level: 0.7 ng/mL — ABNORMAL LOW (ref 0.8–2.0)

## 2012-03-21 MED ORDER — ALBUTEROL SULFATE (5 MG/ML) 0.5% IN NEBU
5.0000 mg | INHALATION_SOLUTION | Freq: Once | RESPIRATORY_TRACT | Status: AC
  Administered 2012-03-21: 5 mg via RESPIRATORY_TRACT
  Filled 2012-03-21: qty 1

## 2012-03-21 MED ORDER — ACETAMINOPHEN 650 MG RE SUPP
650.0000 mg | Freq: Four times a day (QID) | RECTAL | Status: DC | PRN
  Administered 2012-03-21: 650 mg via RECTAL
  Filled 2012-03-21: qty 1

## 2012-03-21 MED ORDER — HYDRALAZINE HCL 20 MG/ML IJ SOLN
10.0000 mg | INTRAMUSCULAR | Status: DC | PRN
  Filled 2012-03-21: qty 0.5

## 2012-03-21 MED ORDER — ISOSORB DINITRATE-HYDRALAZINE 20-37.5 MG PO TABS
1.0000 | ORAL_TABLET | Freq: Three times a day (TID) | ORAL | Status: DC
  Administered 2012-03-22 – 2012-04-02 (×35): 1 via ORAL
  Filled 2012-03-21 (×37): qty 1

## 2012-03-21 MED ORDER — ALBUTEROL SULFATE (5 MG/ML) 0.5% IN NEBU
2.5000 mg | INHALATION_SOLUTION | Freq: Four times a day (QID) | RESPIRATORY_TRACT | Status: DC
  Filled 2012-03-21: qty 0.5

## 2012-03-21 MED ORDER — EZETIMIBE-SIMVASTATIN 10-20 MG PO TABS
1.0000 | ORAL_TABLET | ORAL | Status: DC
  Administered 2012-03-22 – 2012-04-01 (×5): 1 via ORAL
  Filled 2012-03-21 (×8): qty 1

## 2012-03-21 MED ORDER — FOLIC ACID 1 MG PO TABS
1.0000 mg | ORAL_TABLET | Freq: Every day | ORAL | Status: DC
  Administered 2012-03-22 – 2012-04-02 (×12): 1 mg via ORAL
  Filled 2012-03-21 (×12): qty 1

## 2012-03-21 MED ORDER — HYDROCODONE-ACETAMINOPHEN 7.5-325 MG PO TABS: 1.0000 | ORAL_TABLET | ORAL | Status: DC | PRN

## 2012-03-21 MED ORDER — ACETAMINOPHEN 325 MG PO TABS
650.0000 mg | ORAL_TABLET | Freq: Four times a day (QID) | ORAL | Status: DC | PRN
  Administered 2012-03-26 – 2012-04-01 (×5): 650 mg via ORAL
  Filled 2012-03-21 (×5): qty 2

## 2012-03-21 MED ORDER — NITROGLYCERIN 2 % TD OINT
1.0000 [in_us] | TOPICAL_OINTMENT | Freq: Once | TRANSDERMAL | Status: AC
  Administered 2012-03-21: 1 [in_us] via TOPICAL
  Filled 2012-03-21: qty 1

## 2012-03-21 MED ORDER — SODIUM CHLORIDE 0.9 % IJ SOLN
3.0000 mL | Freq: Two times a day (BID) | INTRAMUSCULAR | Status: DC
  Administered 2012-03-21 – 2012-04-01 (×16): 3 mL via INTRAVENOUS

## 2012-03-21 MED ORDER — FUROSEMIDE 10 MG/ML IJ SOLN
40.0000 mg | Freq: Three times a day (TID) | INTRAMUSCULAR | Status: DC
  Administered 2012-03-21 – 2012-03-24 (×8): 40 mg via INTRAVENOUS
  Filled 2012-03-21 (×11): qty 4

## 2012-03-21 MED ORDER — IPRATROPIUM BROMIDE 0.02 % IN SOLN
0.5000 mg | RESPIRATORY_TRACT | Status: AC
  Administered 2012-03-21: 0.5 mg via RESPIRATORY_TRACT
  Filled 2012-03-21: qty 2.5

## 2012-03-21 MED ORDER — WARFARIN - PHARMACIST DOSING INPATIENT
Freq: Every day | Status: DC
  Administered 2012-03-31: 18:00:00
  Administered 2012-04-01: 1

## 2012-03-21 MED ORDER — ONDANSETRON HCL 4 MG PO TABS
4.0000 mg | ORAL_TABLET | Freq: Four times a day (QID) | ORAL | Status: DC | PRN
  Administered 2012-04-02: 4 mg via ORAL
  Filled 2012-03-21: qty 1

## 2012-03-21 MED ORDER — FERROUS SULFATE 325 (65 FE) MG PO TABS
325.0000 mg | ORAL_TABLET | Freq: Every day | ORAL | Status: DC
  Administered 2012-03-22 – 2012-04-02 (×12): 325 mg via ORAL
  Filled 2012-03-21 (×14): qty 1

## 2012-03-21 MED ORDER — POTASSIUM CHLORIDE CRYS ER 20 MEQ PO TBCR
30.0000 meq | EXTENDED_RELEASE_TABLET | Freq: Every day | ORAL | Status: DC
  Administered 2012-03-22 – 2012-04-02 (×11): 30 meq via ORAL
  Filled 2012-03-21 (×12): qty 1

## 2012-03-21 MED ORDER — IPRATROPIUM BROMIDE 0.02 % IN SOLN
0.5000 mg | Freq: Once | RESPIRATORY_TRACT | Status: AC
  Administered 2012-03-21: 0.5 mg via RESPIRATORY_TRACT
  Filled 2012-03-21: qty 2.5

## 2012-03-21 MED ORDER — ALLOPURINOL 100 MG PO TABS: 100.0000 mg | ORAL_TABLET | Freq: Every day | ORAL | Status: DC

## 2012-03-21 MED ORDER — ONDANSETRON HCL 4 MG/2ML IJ SOLN: 4.0000 mg | Freq: Four times a day (QID) | INTRAMUSCULAR | Status: DC | PRN

## 2012-03-21 MED ORDER — GABAPENTIN 100 MG PO CAPS
100.0000 mg | ORAL_CAPSULE | Freq: Every day | ORAL | Status: DC | PRN
  Filled 2012-03-21: qty 3

## 2012-03-21 MED ORDER — PANTOPRAZOLE SODIUM 40 MG PO TBEC
40.0000 mg | DELAYED_RELEASE_TABLET | Freq: Every day | ORAL | Status: DC
  Administered 2012-03-22 – 2012-04-02 (×12): 40 mg via ORAL
  Filled 2012-03-21 (×12): qty 1

## 2012-03-21 MED ORDER — IPRATROPIUM BROMIDE 0.02 % IN SOLN
0.5000 mg | Freq: Four times a day (QID) | RESPIRATORY_TRACT | Status: DC
  Administered 2012-03-22: 0.5 mg via RESPIRATORY_TRACT
  Filled 2012-03-21: qty 2.5

## 2012-03-21 MED ORDER — TEMAZEPAM 15 MG PO CAPS: 15.0000 mg | ORAL_CAPSULE | Freq: Every evening | ORAL | Status: DC | PRN

## 2012-03-21 MED ORDER — GLIPIZIDE ER 2.5 MG PO TB24
2.5000 mg | ORAL_TABLET | Freq: Every day | ORAL | Status: DC
  Filled 2012-03-21: qty 1

## 2012-03-21 MED ORDER — LEVOFLOXACIN IN D5W 500 MG/100ML IV SOLN
500.0000 mg | Freq: Once | INTRAVENOUS | Status: AC
  Administered 2012-03-21: 500 mg via INTRAVENOUS
  Filled 2012-03-21: qty 100

## 2012-03-21 MED ORDER — FUROSEMIDE 10 MG/ML IJ SOLN
80.0000 mg | Freq: Once | INTRAMUSCULAR | Status: AC
  Administered 2012-03-21: 80 mg via INTRAVENOUS
  Filled 2012-03-21: qty 8

## 2012-03-21 MED ORDER — HEPARIN SODIUM (PORCINE) 5000 UNIT/ML IJ SOLN: 5000.0000 [IU] | Freq: Three times a day (TID) | INTRAMUSCULAR | Status: DC

## 2012-03-21 MED ORDER — ALBUTEROL SULFATE (5 MG/ML) 0.5% IN NEBU
2.5000 mg | INHALATION_SOLUTION | RESPIRATORY_TRACT | Status: DC | PRN
  Administered 2012-03-22: 2.5 mg via RESPIRATORY_TRACT

## 2012-03-21 MED ORDER — GABAPENTIN 100 MG PO CAPS: 100.0000 mg | ORAL_CAPSULE | Freq: Every day | ORAL | Status: DC

## 2012-03-21 MED ORDER — BUDESONIDE 0.5 MG/2ML IN SUSP
0.5000 mg | Freq: Two times a day (BID) | RESPIRATORY_TRACT | Status: DC
  Administered 2012-03-22: 0.5 mg via RESPIRATORY_TRACT
  Filled 2012-03-21 (×2): qty 2

## 2012-03-21 MED ORDER — LEVOTHYROXINE SODIUM 75 MCG PO TABS
75.0000 ug | ORAL_TABLET | Freq: Every day | ORAL | Status: DC
Start: 2012-03-22 — End: 2012-03-22
  Filled 2012-03-21: qty 1

## 2012-03-21 MED ORDER — INSULIN ASPART 100 UNIT/ML ~~LOC~~ SOLN
0.0000 [IU] | Freq: Three times a day (TID) | SUBCUTANEOUS | Status: DC
  Administered 2012-03-22 (×2): 2 [IU] via SUBCUTANEOUS
  Administered 2012-03-22: 1 [IU] via SUBCUTANEOUS
  Administered 2012-03-23: 3 [IU] via SUBCUTANEOUS
  Administered 2012-03-23 (×2): 2 [IU] via SUBCUTANEOUS
  Administered 2012-03-24 (×3): 1 [IU] via SUBCUTANEOUS
  Administered 2012-03-25: 2 [IU] via SUBCUTANEOUS
  Administered 2012-03-25 – 2012-03-26 (×2): 1 [IU] via SUBCUTANEOUS
  Administered 2012-03-26 – 2012-03-27 (×2): 2 [IU] via SUBCUTANEOUS
  Administered 2012-03-28 – 2012-03-30 (×6): 1 [IU] via SUBCUTANEOUS
  Administered 2012-03-31: 3 [IU] via SUBCUTANEOUS
  Administered 2012-03-31 – 2012-04-01 (×2): 1 [IU] via SUBCUTANEOUS
  Administered 2012-04-01: 2 [IU] via SUBCUTANEOUS
  Administered 2012-04-01 – 2012-04-02 (×2): 1 [IU] via SUBCUTANEOUS

## 2012-03-21 MED ORDER — METOPROLOL SUCCINATE ER 100 MG PO TB24
100.0000 mg | ORAL_TABLET | Freq: Every day | ORAL | Status: DC
  Administered 2012-03-22 – 2012-03-29 (×8): 100 mg via ORAL
  Filled 2012-03-21 (×8): qty 1

## 2012-03-21 MED ORDER — LEVOFLOXACIN IN D5W 500 MG/100ML IV SOLN
500.0000 mg | INTRAVENOUS | Status: DC
  Filled 2012-03-21: qty 100

## 2012-03-21 NOTE — ED Notes (Signed)
Spoke with bed control regarding bed status - states pending bed opening on stepdown unit.  States no beds available at this time and are unsure of time status.  Explained to pt, pt verbalized understanding.

## 2012-03-21 NOTE — ED Notes (Signed)
[facility] en route to transport pt.

## 2012-03-21 NOTE — ED Notes (Signed)
Pt breathing easier on bipap, no longer tachypneic, no longer labored.  nad noted.  Explained delay in bed status to pt/family.  Verbalized understanding.  Offered meal tray, pt refused stating she has had no appetite lately.

## 2012-03-21 NOTE — ED Notes (Signed)
Pt placed on 02 at 2L/min via [place].

## 2012-03-21 NOTE — H&P (Signed)
[name] [name] is an 78 y.o. female.   PCP - Dr.Margaret [name]. Cardiologist - Dr.wientraub. Chief Complaint: Shortness of breath. HPI: 78 year-old female with known history of nonischemic cardiomyopathy last EF measured was in 2011 was 25%, history of pacemaker and ICD placement, history of mechanical mitral valve aortic valve on Coumadin, COPD, diabetes mellitus type 2 presented to the ER at [facility] with shortness of breath. Patient has been experiencing shortness of breath over the last 4 days which is exertional and has been worsening. Has nonproductive cough and denies any chest pain associated with. In the ER patient was found to be in respiratory distress and was placed on BiPAP. Chest x-rays are showing congestion and labs show increased BNP and also positive troponin. ER physician had contacted [facility] heart and vascular cardiologist and they have requested transfer to [facility] for further management. When I examined the patient patient is to on BiPAP mildly lethargic but not in distress. Patient is able to talk without discomfort and denies any chest pain. Does have some suprapubic pain it is a burning in sensation though denies any nausea vomiting or diarrhea. Specifically denies any chest pain. Patient was found to be febrile.  Past Medical History  Diagnosis Date  . DJD (degenerative joint disease) of lumbar spine   . Chronic kidney disease, stage I   . Esophageal reflux   . Other and unspecified hyperlipidemia   . Other primary cardiomyopathies   . Obesity, unspecified   . Unspecified hypothyroidism   . Type I (juvenile type) diabetes mellitus without mention of complication, not stated as uncontrolled   . Essential hypertension, benign   . 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 Pain in joint, pelvic region and thigh   . Insomnia, unspecified   . Iron deficiency 10/04/2011  . Anemia   . Cancer   . CHF (congestive heart  failure)   . COPD (chronic obstructive pulmonary disease)   . Heart murmur     Past Surgical History  Procedure Date  . Thyroidectomy   . Pacemaker placed 2004  . Aortic and mitral valve replacement 2001  . Right breast cyst removed     benign   . Right cataract removed     2005  . Defibrillator implanted 2006     Family History  Problem Relation Age of Onset  . Cancer Mother     behind pancres  . Heart disease Father   . Heart disease Sister     Heart attack  . Heart disease Brother   . Diabetes Brother    Social History:  reports that she quit smoking about 25 years ago. She does not have any smokeless tobacco history on file. She reports that she does not drink alcohol or use illicit drugs.  Allergies:  Allergies  Allergen Reactions  . Niacin     REACTION: itching  . Penicillins     Medications Prior to Admission  Medication Sig Dispense Refill  . ADVAIR DISKUS 100-50 MCG/DOSE AEPB inhale 1 dose by mouth twice a day as directed  60 each  4  . allopurinol (ZYLOPRIM) 100 MG tablet Take 100 mg by mouth daily.      . Calcium Carbonate (CALCIUM 600 PO) Take 900 mg by mouth daily. Patient takes 1&1/2 tablet daily      . DUONEB 0.5-2.5 (3) MG/3ML SOLN USE 1 VIAL IN NEBULIZER 3 TIMES DAILY  3 mL  270  . ezetimibe-simvastatin (VYTORIN)  10-20 MG per tablet Take 1 tablet by mouth 3 (three) times a week.       . ferrous sulfate 325 (65 FE) MG tablet Take 325 mg by mouth daily with breakfast.       . folic acid (FOLVITE) 1 MG tablet take 1 tablet by mouth once daily  100 tablet  6  . furosemide (LASIX) 40 MG tablet Take 1 tablet (40 mg total) by mouth daily.  90 tablet  3  . gabapentin (NEURONTIN) 100 MG capsule Take 100-300 mg by mouth at bedtime. For leg pain and burning      . glipiZIDE (GLUCOTROL XL) 2.5 MG 24 hr tablet Take 2.5 mg by mouth daily.      Marland Kitchen glucose blood (FREESTYLE TEST STRIPS) test strip Use as instructed once daily with Freestyle Lite meter  50 each  11  .  HYDROcodone-acetaminophen (NORCO) 7.5-325 MG per tablet Take 1 tablet by mouth every 4 (four) hours as needed. pain      . isosorbide-hydrALAZINE (BIDIL) 20-37.5 MG per tablet Take 1 tablet by mouth 3 (three) times daily.  270 tablet  2  . LANOXIN 0.125 MG tablet Take 1 tablet (125 mcg total) by mouth daily.  90 tablet  1  . levothyroxine (SYNTHROID, LEVOTHROID) 75 MCG tablet Take 1 tablet (75 mcg total) by mouth daily.  90 tablet  3  . loratadine (CLARITIN REDITABS) 10 MG dissolvable tablet Take 10 mg by mouth daily as needed. allergies      . losartan (COZAAR) 50 MG tablet Take 50 mg by mouth daily.       . metoprolol succinate (TOPROL-XL) 100 MG 24 hr tablet Take 100 mg by mouth daily. Take with or immediately following a meal.      . omeprazole (PRILOSEC) 40 MG capsule Take 40 mg by mouth daily.        . potassium chloride SA (K-DUR,KLOR-CON) 20 MEQ tablet Take 30 mEq by mouth daily. 1 1/2 tablet daily      . promethazine (PHENERGAN) 25 MG tablet TAKE 1 TABLET BY MOUTH ONCE DAILY AS NEEDED  90 tablet  1  . temazepam (RESTORIL) 30 MG capsule TAKE 1 CAPSULE AT BEDTIME AS NEEDED FOR SLEEP  30 capsule  3  . warfarin (COUMADIN) 5 MG tablet Take 1 tablet (5 mg total) by mouth daily. Mondays and Friday 5 mg Other days 7.5 mg  90 tablet  0    Results for orders placed during the hospital encounter of 03/21/12 (from the past 48 hour(s))  CBC WITH DIFFERENTIAL     Status: Abnormal   Collection Time   03/21/12 11:19 AM      Component Value Range Comment   WBC 11.9 (*) 4.0 - 10.5 K/uL    RBC 3.30 (*) 3.87 - 5.11 MIL/uL    Hemoglobin 9.7 (*) 12.0 - 15.0 g/dL    HCT 30.2 (*) 36.0 - 46.0 %    MCV 91.5  78.0 - 100.0 fL    MCH 29.4  26.0 - 34.0 pg    MCHC 32.1  30.0 - 36.0 g/dL    RDW 14.4  11.5 - 15.5 %    Platelets 222  150 - 400 K/uL    Neutrophils Relative 88 (*) 43 - 77 %    Neutro Abs 10.5 (*) 1.7 - 7.7 K/uL    Lymphocytes Relative 4 (*) 12 - 46 %    Lymphs Abs 0.4 (*) 0.7 - 4.0 K/uL  Monocytes Relative 8  3 - 12 %    Monocytes Absolute 0.9  0.1 - 1.0 K/uL    Eosinophils Relative 0  0 - 5 %    Eosinophils Absolute 0.0  0.0 - 0.7 K/uL    Basophils Relative 0  0 - 1 %    Basophils Absolute 0.0  0.0 - 0.1 K/uL   BASIC METABOLIC PANEL     Status: Abnormal   Collection Time   03/21/12 11:19 AM      Component Value Range Comment   Sodium 133 (*) 135 - 145 mEq/L    Potassium 4.6  3.5 - 5.1 mEq/L    Chloride 96  96 - 112 mEq/L    CO2 22  19 - 32 mEq/L    Glucose, Bld 157 (*) 70 - 99 mg/dL    BUN 48 (*) 6 - 23 mg/dL    Creatinine, Ser 2.22 (*) 0.50 - 1.10 mg/dL    Calcium 10.1  8.4 - 10.5 mg/dL    GFR calc non Af Amer 20 (*) >90 mL/min    GFR calc Af Amer 23 (*) >90 mL/min   PRO B NATRIURETIC PEPTIDE     Status: Abnormal   Collection Time   03/21/12 11:19 AM      Component Value Range Comment   Pro B Natriuretic peptide (BNP) 34990.0 (*) 0 - 450 pg/mL   DIGOXIN LEVEL     Status: Abnormal   Collection Time   03/21/12 11:19 AM      Component Value Range Comment   Digoxin Level 0.7 (*) 0.8 - 2.0 ng/mL   APTT     Status: Abnormal   Collection Time   03/21/12 11:19 AM      Component Value Range Comment   aPTT 79 (*) 24 - 37 seconds   PROTIME-INR     Status: Abnormal   Collection Time   03/21/12 11:19 AM      Component Value Range Comment   Prothrombin Time 40.7 (*) 11.6 - 15.2 seconds    INR 4.15 (*) 0.00 - 1.49   HEPATIC FUNCTION PANEL     Status: Abnormal   Collection Time   03/21/12 11:19 AM      Component Value Range Comment   Total Protein 8.5 (*) 6.0 - 8.3 g/dL    Albumin 3.6  3.5 - 5.2 g/dL    AST 74 (*) 0 - 37 U/L    ALT 20  0 - 35 U/L    Alkaline Phosphatase 45  39 - 117 U/L    Total Bilirubin 0.9  0.3 - 1.2 mg/dL    Bilirubin, Direct 0.6 (*) 0.0 - 0.3 mg/dL    Indirect Bilirubin 0.3  0.3 - 0.9 mg/dL   TROPONIN I     Status: Abnormal   Collection Time   03/21/12 11:19 AM      Component Value Range Comment   Troponin I 0.43 (*) <0.30 ng/mL   POCT  I-STAT TROPONIN I     Status: Abnormal   Collection Time   03/21/12 11:58 AM      Component Value Range Comment   Troponin i, poc 0.42 (*) 0.00 - 0.08 ng/mL    Comment 3            URINALYSIS, ROUTINE W REFLEX MICROSCOPIC     Status: Abnormal   Collection Time   03/21/12 12:29 PM      Component Value Range Comment   Color, Urine YELLOW  YELLOW    APPearance CLEAR  CLEAR    Specific Gravity, Urine 1.025  1.005 - 1.030    pH 5.5  5.0 - 8.0    Glucose, UA NEGATIVE  NEGATIVE mg/dL    Hgb urine dipstick LARGE (*) NEGATIVE    Bilirubin Urine NEGATIVE  NEGATIVE    Ketones, ur NEGATIVE  NEGATIVE mg/dL    Protein, ur >300 (*) NEGATIVE mg/dL    Urobilinogen, UA 1.0  0.0 - 1.0 mg/dL    Nitrite NEGATIVE  NEGATIVE    Leukocytes, UA NEGATIVE  NEGATIVE   URINE MICROSCOPIC-ADD ON     Status: Abnormal   Collection Time   03/21/12 12:29 PM      Component Value Range Comment   Squamous Epithelial / LPF FEW (*) RARE    WBC, UA 0-2  <3 WBC/hpf    RBC / HPF 3-6  <3 RBC/hpf    Bacteria, UA FEW (*) RARE    Casts HYALINE CASTS (*) NEGATIVE GRANULAR CAST  BLOOD GAS, ARTERIAL     Status: Abnormal   Collection Time   03/21/12  7:50 PM      Component Value Range Comment   FIO2 30.00      Delivery systems BILEVEL POSITIVE AIRWAY PRESSURE      Inspiratory PAP 12      Expiratory PAP 6      pH, Arterial 7.415 (*) 7.350 - 7.400    pCO2 arterial 40.5  35.0 - 45.0 mmHg    pO2, Arterial 77.5 (*) 80.0 - 100.0 mmHg    Bicarbonate 25.5 (*) 20.0 - 24.0 mEq/L    TCO2 23.7  0 - 100 mmol/L    Acid-Base Excess 1.4  0.0 - 2.0 mmol/L    O2 Saturation 95.3      Patient temperature 37.0      Collection site RIGHT RADIAL      Drawn by [id number]      Sample type ARTERIAL      Allens test (pass/fail) PASS  PASS    Dg Chest Port 1 View  03/21/2012  *RADIOLOGY REPORT*  Clinical Data: Shortness of breath.  PORTABLE CHEST - 1 VIEW  Comparison: 11/20/2011  Findings: Stable enlargement of the cardiac silhouette.  Post cardiac  surgery changes with a left cardiac ICD.  Increased densities in the right lower lung could be related to overlying soft tissues but also concerning for dependent edema.  Upper lungs are clear.  IMPRESSION: Increased densities in the right lower lung.  Findings could be associated with asymmetric dependent edema.  These densities may be accentuated by overlying soft tissue.  Cardiomegaly with post surgical changes.  Original Report Authenticated By: Markus Daft, M.D.    Review of Systems  Constitutional: Negative.   HENT: Negative.   Eyes: Negative.   Respiratory: Positive for cough and shortness of breath.   Cardiovascular: Negative.   Gastrointestinal: Negative.   Genitourinary: Negative.   Musculoskeletal: Negative.   Skin: Negative.   Neurological: Negative.   Endo/Heme/Allergies: Negative.   Psychiatric/Behavioral: Negative.     Blood pressure 126/66, pulse 100, temperature 103.4 F (39.7 C), temperature source Rectal, resp. rate 34, height 5\' 6"  (1.676 m), weight 110.224 kg (243 lb), SpO2 99.00%. Physical Exam  Constitutional: She is oriented to person, place, and time. She appears well-developed and well-nourished. No distress.  HENT:  Head: Normocephalic and atraumatic.  Right Ear: External ear normal.  Left Ear: External ear normal.  Nose: Nose normal.  Mouth/Throat: Oropharynx is  clear and moist. No oropharyngeal exudate.  Eyes: Conjunctivae are normal. Pupils are equal, round, and reactive to light. Right eye exhibits no discharge. Left eye exhibits no discharge. No scleral icterus.  Neck: Normal range of motion. Neck supple.  Cardiovascular: Normal rate and regular rhythm.   Respiratory: Effort normal and breath sounds normal. No respiratory distress. She has no wheezes. She has no rales.  GI: Soft. Bowel sounds are normal. She exhibits no distension. There is no tenderness. There is no rebound.  Musculoskeletal: Normal range of motion. She exhibits no edema and no  tenderness.  Neurological: She is alert and oriented to person, place, and time.       Moves all extremities.  Skin: She is not diaphoretic.     Assessment/Plan #1. Acute respiratory failure most likely from decompensated CHF in a patient with known history of non-ischemic cardiomyopathy last EF measured was in 2011 was 25% - patient was given 80 mg of IV Lasix at the ER. I have ordered 40 mg IV q. 8. Continue close monitoring take output and metabolic panel. Patient's troponins are positive the patient denies any chest pain. EKG is paced. Patient already has coagulopathy from Coumadin so no heparin has been started. We will continue to cycle cardiac markers. Patient also has history of COPD so patient will be continued on nebulizer and Pulmicort. 2-D echo has been ordered given the fact that patient has mechanical mitral and aortic valve and we need to check the function. [place] heart and vascular cardiologist will be listed for consult. #2. Fever - source not clear. Patient does complain of some suprapubic pain. Denies any dysuria and UA is unremarkable. Patient does have nonproductive cough and chest x-ray does not show any definite infiltrates. I have ordered blood cultures and urine cultures procalcitonin levels and the lactic acid level. I have also ordered Levaquin after blood cultures to treat empirically for now for possible UTI. 2-D echo also has been ordered given the history of mechanical valve. #3. Acute renal failure - closely follow intake output and metabolic panel. I have placed digoxin and Cozaar on hold due to the acute renal failure. #4. 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 #5. COPD - continue nebulizer and Pulmicort. #6. Diabetes mellitus type 2 - continue Glucotrol. Patient has been placed on sliding-scale coverage. #7. Breast intraductal papilloma. #8. Status post ICD placement pacemaker placement.  CODE STATUS - full code.  Rise Patience. 03/21/2012, 10:15 PM

## 2012-03-21 NOTE — Telephone Encounter (Signed)
Will forward 

## 2012-03-21 NOTE — ED Provider Notes (Cosign Needed Addendum)
History  This chart was scribed for [name] Norrie, MD by Jenne Campus. This patient was seen in room APA01/APA01 and the patient's care was started at 11:08AM.  CSN: [id number]  Arrival date & time 03/21/12  1054   First MD Initiated Contact with Patient 03/21/12 1108      Chief Complaint  Patient presents with  . Shortness of Breath    Patient is a 78 y.o. female presenting with shortness of breath. The history is provided by the patient. No language interpreter was used.  Shortness of Breath  The current episode started 3 to 5 days ago. The onset was gradual. The problem occurs continuously. The problem has been gradually worsening. Nothing relieves the symptoms. The symptoms are aggravated by activity. She has had no prior hospitalizations. Her past medical history does not include asthma. Urine output has been normal. There were no sick contacts. She has received no recent medical care.     [name] [name] is a 78 y.o. female with a CHF and COPD who presents to the Emergency Department 3 days of gradual onset, gradually worsening, constant SOB with associated weakness, occasional wheezing, and decreased appetite. The symptoms are worse with exertion but she feels the symptoms at rest as well. She states that she uses a nebulizer at home with no improvement in her symptoms. She reports experiencing prior milder episodes of similar symptoms but denies prior admissions. She reports that she stopped taking lasix 2 days ago, because she was experiencing frequency and nocturia. She states that she has been taking her other medications as prescribed. She reports that she has been going to the bathroom as much as normal but has been having difficulty walking to the bathroom by herself. She normally uses a cane at home and is on 2L of oxygen. She denies chest pain, fever,  lower leg swelling, rash, abdominal pain, and sore throat as associated symptoms. She has a mild cough She has a h/o DJD, DM, HTN,  and anemia. Pt is on coumadin for an artificial valve and defibrillator. She is a former smoker but denies alcohol use.    Dr. [name] is PCP. Cardiologist Dr Rollene Fare  Past Medical History  Diagnosis Date  . DJD (degenerative joint disease) of lumbar spine   . Chronic kidney disease, stage I   . Esophageal reflux   . Other and unspecified hyperlipidemia   . Other primary cardiomyopathies   . Obesity, unspecified   . Unspecified hypothyroidism   . Type I (juvenile type) diabetes mellitus without mention of complication, not stated as uncontrolled   . Essential hypertension, benign   . 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 Pain in joint, pelvic region and thigh   . Insomnia, unspecified   . Iron deficiency 10/04/2011  . Anemia   . Cancer   . CHF (congestive heart failure)   . COPD (chronic obstructive pulmonary disease)   . Heart murmur     Past Surgical History  Procedure Date  . Thyroidectomy   . Pacemaker placed 2004  . Aortic and mitral valve replacement 2001  . Right breast cyst removed     benign   . Right cataract removed     2005  . Defibrillator implanted 2006     Family History  Problem Relation Age of Onset  . Cancer Mother     behind pancres  . Heart disease Father   . Heart disease Sister     Heart attack  .  Heart disease Brother   . Diabetes Brother     History  Substance Use Topics  . Smoking status: Former Smoker    Quit date: 03/08/1987  . Smokeless tobacco: Not on file  . Alcohol Use: No  She uses a cane Uses a cane when feels weak Oxygen at home 2 lpm    No OB history provided.  Review of Systems  All other systems reviewed and are negative.    Allergies  Niacin and Penicillins  Home Medications   Current Outpatient Rx  Name Route Sig Dispense Refill  . ADVAIR DISKUS 100-50 MCG/DOSE IN AEPB  inhale 1 dose by mouth twice a day as directed 60 each 4    Refill Approved  . ALLOPURINOL 300 MG PO TABS  Oral Take 100 mg by mouth daily.     Marland Kitchen CALCIUM 600 PO Oral Take 1,500 mg by mouth daily.      . CYCLOBENZAPRINE HCL ER 15 MG PO CP24 Oral Take 15 mg by mouth daily.      . CYCLOBENZAPRINE HCL PO Oral Take 10 mg by mouth at bedtime.      . DUONEB 0.5-2.5 (3) MG/3ML IN SOLN  USE 1 VIAL IN NEBULIZER 3 TIMES DAILY 3 mL 270  . EZETIMIBE-SIMVASTATIN 10-20 MG PO TABS Oral Take 1 tablet by mouth 3 (three) times a week.     Marland Kitchen FERROUS SULFATE 325 (65 FE) MG PO TABS Oral Take 325 mg by mouth daily with breakfast.     . FLUTICASONE PROPIONATE 50 MCG/ACT NA SUSP Nasal Place 1 spray into the nose daily. 16 g 3  . FOLIC ACID 1 MG PO TABS  take 1 tablet by mouth once daily 100 tablet 6  . FUROSEMIDE 40 MG PO TABS Oral Take 1 tablet (40 mg total) by mouth daily. 90 tablet 3  . GABAPENTIN 100 MG PO CAPS  Take one to three capsules at night for leg pain and burning. 90 capsule 3  . GLIPIZIDE ER 2.5 MG PO TB24  One tablet every other day, dose reductio effective 02/22/2012 45 tablet 3  . GLUCOSE BLOOD VI STRP  Use as instructed once daily with Freestyle Lite meter 50 each 11  . HYDROCODONE-ACETAMINOPHEN 5-325 MG PO TABS Oral Take 1 tablet by mouth daily.      . ISOSORB DINITRATE-HYDRALAZINE 20-37.5 MG PO TABS Oral Take 1 tablet by mouth 3 (three) times daily. 270 tablet 2  . LANOXIN 0.125 MG PO TABS Oral Take 1 tablet (125 mcg total) by mouth daily. 90 tablet 1    Dispense as written.  Marland Kitchen LEVOTHYROXINE SODIUM 75 MCG PO TABS Oral Take 1 tablet (75 mcg total) by mouth daily. 90 tablet 3  . LORATADINE 10 MG PO TBDP Oral Take 10 mg by mouth daily.    Marland Kitchen LOSARTAN POTASSIUM 50 MG PO TABS Oral Take 50 mg by mouth daily.     Marland Kitchen LOSARTAN POTASSIUM-HCTZ 50-12.5 MG PO TABS Oral Take 1 tablet by mouth daily.      Marland Kitchen METAXALONE 400 MG HALF TABLET  One tablet at bedtime as needed for back and neck spasm 30 tablet 3  . OMEPRAZOLE 40 MG PO CPDR Oral Take 40 mg by mouth daily.      Marland Kitchen POTASSIUM CHLORIDE CRYS ER 20 MEQ PO TBCR  1 1/2  tablet daily 135 tablet 3  . PROMETHAZINE HCL 25 MG PO TABS  TAKE 1 TABLET BY MOUTH ONCE DAILY AS NEEDED 90 tablet 1  .  TEMAZEPAM 30 MG PO CAPS  TAKE 1 CAPSULE AT BEDTIME AS NEEDED FOR SLEEP 30 capsule 3  . TOPROL XL 100 MG PO TB24 Oral Take 1 tablet (100 mg total) by mouth daily. Take with or immediately following a meal. 90 tablet 1    Dispense as written.  . TRAMADOL HCL 50 MG PO TABS      . WARFARIN SODIUM 10 MG PO TABS Oral Take 10 mg by mouth daily. Take once a day except on Thursday 13 mg     . WARFARIN SODIUM 5 MG PO TABS Oral Take 1 tablet (5 mg total) by mouth daily. Mondays and Friday 5 mg Other days 7.5 mg 90 tablet 0    Triage Vitals: BP 162/86  Pulse 112  Temp 98.1 F (36.7 C) (Oral)  Resp 24  Ht 5\' 6"  (1.676 m)  Wt 243 lb (110.224 kg)  BMI 39.22 kg/m2  SpO2 96%  Vital signs normal except hypertension, tachycardia   Physical Exam  Nursing note and vitals reviewed. Constitutional: She is oriented to person, place, and time. She appears well-developed and well-nourished. No distress.  HENT:  Head: Normocephalic and atraumatic.  Right Ear: External ear normal.  Left Ear: External ear normal.  Nose: Nose normal.  Mouth/Throat: Oropharynx is clear and moist.  Eyes: Conjunctivae and EOM are normal. Pupils are equal, round, and reactive to light.  Neck: Normal range of motion. Neck supple. No tracheal deviation present.  Cardiovascular: Normal rate and regular rhythm.   Murmur heard. Pulmonary/Chest: Tachypnea (even at rest) noted. She is in respiratory distress. She has wheezes (Diffuse high pitched expiratory wheezing). She has no rales.       Retractions noted, appears SOB at rest and has trouble speaking b/o SOB  Abdominal: Soft. Bowel sounds are normal. She exhibits no distension.  Musculoskeletal: Normal range of motion. She exhibits no edema (no lower leg edema) and no tenderness.       Appears to be swollen but non pitting  Neurological: She is alert and  oriented to person, place, and time. No cranial nerve deficit.  Skin: Skin is warm and dry.  Psychiatric: She has a normal mood and affect. Her behavior is normal.    ED Course  Procedures (including critical care time)   Medications  furosemide (LASIX) injection 80 mg (80 mg Intravenous Given 03/21/12 1148)  nitroGLYCERIN (NITROGLYN) 2 % ointment 1 inch (1 inch Topical Given 03/21/12 1200)  albuterol (PROVENTIL) (5 MG/ML) 0.5% nebulizer solution 5 mg (5 mg Nebulization Given 03/21/12 1153)  ipratropium (ATROVENT) nebulizer solution 0.5 mg (0.5 mg Nebulization Given 03/21/12 1153)  albuterol (PROVENTIL) (5 MG/ML) 0.5% nebulizer solution 5 mg (5 mg Nebulization Given 03/21/12 1451)  ipratropium (ATROVENT) nebulizer solution 0.5 mg (0.5 mg Nebulization Given 03/21/12 1451)    12:30 Recheck after nebulizer. Pt states her breathing is better, refused another nebulizer. Her lungs are clear, still has tachypnea.  13:09 Dr Caryn Section, asks to talk to [facility], since yesterday they didn't have anyone in the [place] office  13:47 Dr Gwenlyn Found, accepts in transfer to [facility] to stepdown.  14:15  Pt has had mild diuretic response to lasix. Still appears tachypneic. Doesn't want another treatment.    14:40 Is agreeable to have another treatment, and to try BiPap  16:09 Recheck on BiPap is feeling better, waiting for bed at [facility] for transfer, Dr Roxanne Mins made aware of patient.     DIAGNOSTIC STUDIES: Oxygen Saturation is 96% on oxygen, adequate by my interpretation.  COORDINATION OF CARE: 11:50AM-Discussed treatment plan with pt and pt argeed to plan  Results for orders placed during the hospital encounter of 03/21/12  CBC WITH DIFFERENTIAL      Component Value Range   WBC 11.9 (*) 4.0 - 10.5 K/uL   RBC 3.30 (*) 3.87 - 5.11 MIL/uL   Hemoglobin 9.7 (*) 12.0 - 15.0 g/dL   HCT 30.2 (*) 36.0 - 46.0 %   MCV 91.5  78.0 - 100.0 fL   MCH 29.4  26.0 - 34.0 pg   MCHC 32.1  30.0 - 36.0 g/dL   RDW 14.4  11.5 - 15.5  %   Platelets 222  150 - 400 K/uL   Neutrophils Relative 88 (*) 43 - 77 %   Neutro Abs 10.5 (*) 1.7 - 7.7 K/uL   Lymphocytes Relative 4 (*) 12 - 46 %   Lymphs Abs 0.4 (*) 0.7 - 4.0 K/uL   Monocytes Relative 8  3 - 12 %   Monocytes Absolute 0.9  0.1 - 1.0 K/uL   Eosinophils Relative 0  0 - 5 %   Eosinophils Absolute 0.0  0.0 - 0.7 K/uL   Basophils Relative 0  0 - 1 %   Basophils Absolute 0.0  0.0 - 0.1 K/uL  BASIC METABOLIC PANEL      Component Value Range   Sodium 133 (*) 135 - 145 mEq/L   Potassium 4.6  3.5 - 5.1 mEq/L   Chloride 96  96 - 112 mEq/L   CO2 22  19 - 32 mEq/L   Glucose, Bld 157 (*) 70 - 99 mg/dL   BUN 48 (*) 6 - 23 mg/dL   Creatinine, Ser 2.22 (*) 0.50 - 1.10 mg/dL   Calcium 10.1  8.4 - 10.5 mg/dL   GFR calc non Af Amer 20 (*) >90 mL/min   GFR calc Af Amer 23 (*) >90 mL/min  PRO B NATRIURETIC PEPTIDE      Component Value Range   Pro B Natriuretic peptide (BNP) 34990.0 (*) 0 - 450 pg/mL  DIGOXIN LEVEL      Component Value Range   Digoxin Level 0.7 (*) 0.8 - 2.0 ng/mL  APTT      Component Value Range   aPTT 79 (*) 24 - 37 seconds  PROTIME-INR      Component Value Range   Prothrombin Time 40.7 (*) 11.6 - 15.2 seconds   INR 4.15 (*) 0.00 - 1.49  HEPATIC FUNCTION PANEL      Component Value Range   Total Protein 8.5 (*) 6.0 - 8.3 g/dL   Albumin 3.6  3.5 - 5.2 g/dL   AST 74 (*) 0 - 37 U/L   ALT 20  0 - 35 U/L   Alkaline Phosphatase 45  39 - 117 U/L   Total Bilirubin 0.9  0.3 - 1.2 mg/dL   Bilirubin, Direct 0.6 (*) 0.0 - 0.3 mg/dL   Indirect Bilirubin 0.3  0.3 - 0.9 mg/dL  URINALYSIS, ROUTINE W REFLEX MICROSCOPIC      Component Value Range   Color, Urine YELLOW  YELLOW   APPearance CLEAR  CLEAR   Specific Gravity, Urine 1.025  1.005 - 1.030   pH 5.5  5.0 - 8.0   Glucose, UA NEGATIVE  NEGATIVE mg/dL   Hgb urine dipstick LARGE (*) NEGATIVE   Bilirubin Urine NEGATIVE  NEGATIVE   Ketones, ur NEGATIVE  NEGATIVE mg/dL   Protein, ur >300 (*) NEGATIVE mg/dL    Urobilinogen, UA 1.0  0.0 -  1.0 mg/dL   Nitrite NEGATIVE  NEGATIVE   Leukocytes, UA NEGATIVE  NEGATIVE  POCT I-STAT TROPONIN I      Component Value Range   Troponin i, poc 0.42 (*) 0.00 - 0.08 ng/mL   Comment 3           TROPONIN I      Component Value Range   Troponin I 0.43 (*) <0.30 ng/mL  URINE MICROSCOPIC-ADD ON      Component Value Range   Squamous Epithelial / LPF FEW (*) RARE   WBC, UA 0-2  <3 WBC/hpf   RBC / HPF 3-6  <3 RBC/hpf   Bacteria, UA FEW (*) RARE   Casts HYALINE CASTS (*) NEGATIVE    Laboratory interpretation all normal except anemia, leukocytosis, very elevated BNP, + troponin, overtherapeutic INR   Dg Chest Port 1 View  03/21/2012  *RADIOLOGY REPORT*  Clinical Data: Shortness of breath.  PORTABLE CHEST - 1 VIEW  Comparison: 11/20/2011  Findings: Stable enlargement of the cardiac silhouette.  Post cardiac surgery changes with a left cardiac ICD.  Increased densities in the right lower lung could be related to overlying soft tissues but also concerning for dependent edema.  Upper lungs are clear.  IMPRESSION: Increased densities in the right lower lung.  Findings could be associated with asymmetric dependent edema.  These densities may be accentuated by overlying soft tissue.  Cardiomegaly with post surgical changes.  Original Report Authenticated By: Markus Daft, M.D.    Date: 03/21/2012  Rate: 106  Rhythm: Electronic paced rhythm  QRS Axis: NA  Intervals: NA  ST/T Wave abnormalities: NA  Conduction Disutrbances:NA  Narrative Interpretation:   Old EKG Reviewed: unchanged from 06/08/2010    1. Congestive heart disease   2. Warfarin-induced coagulopathy   3. Anemia   4. Renal insufficiency   5. Respiratory distress   6. Wheezing     Plan transfer and admission to [place] Performed by: Rolland Porter L   Total critical care time: 38 min  Critical care time was exclusive of separately billable procedures and treating other patients.  Critical  care was necessary to treat or prevent imminent or life-threatening deterioration.  Critical care was time spent personally by me on the following activities: development of treatment plan with patient and/or surrogate as well as nursing, discussions with consultants, evaluation of patient's response to treatment, examination of patient, obtaining history from patient or surrogate, ordering and performing treatments and interventions, ordering and review of laboratory studies, ordering and review of radiographic studies, pulse oximetry and re-evaluation of patient's condition.   MDM   I personally performed the services described in this documentation, which was scribed in my presence. The recorded information has been reviewed and considered.  Rolland Porter, MD, FACEP    [name] Norrie, MD 03/21/12 [place], MD 03/21/12 1610

## 2012-03-21 NOTE — ED Notes (Signed)
Per RT - removed pt from bipap and placed on [place].  Pt tolerating well at this time.  No resp distress noted.

## 2012-03-21 NOTE — ED Notes (Signed)
Pt c/o shortness of breath x 3 days. Worse with exertion. States that she has not taken her fluid pill since the day prior to symptoms starting.

## 2012-03-21 NOTE — ED Notes (Signed)
CRITICAL VALUE ALERT  Critical value received:  Troponin 0.43  Date of notification:  03/21/2012  Time of notification:  1422  Critical value read back:yes  Nurse who received alert:  Felicity Coyer, RN  MD notified (1st page):  Dr. Rolland Porter  Time of first page:  1422  MD notified (2nd page):  Time of second page:  Responding MD:  Dr. Rolland Porter  Time MD responded:  [phone]

## 2012-03-21 NOTE — ED Notes (Signed)
Pt became labored breathing with panting respirations, pt denies being sob.  Unable to speak full sentences.  RT called to place pt back on bipap.

## 2012-03-21 NOTE — Progress Notes (Signed)
ANTICOAGULATION CONSULT NOTE - Initial Consult  Pharmacy Consult for Coumadin and Levaquin Indication: mechanical AVR/MVR, possible UTI  Allergies  Allergen Reactions  . Niacin     REACTION: itching  . Penicillins     Patient Measurements: Height: 5\' 6"  (167.6 cm) Weight: 243 lb (110.224 kg) IBW/kg (Calculated) : 59.3   Vital Signs: Temp: 103.4 F (39.7 C) (06/27 2057) Temp src: Rectal (06/27 2057) BP: 126/66 mmHg (06/27 2000) Pulse Rate: 100  (06/27 2056)  Labs:  Basename 03/21/12 1119  HGB 9.7*  HCT 30.2*  PLT 222  APTT 79*  LABPROT 40.7*  INR 4.15*  HEPARINUNFRC --  CREATININE 2.22*  CKTOTAL --  CKMB --  TROPONINI 0.43*    Estimated Creatinine Clearance: 26.3 ml/min (by C-G formula based on Cr of 2.22).   Medical History: Past Medical History  Diagnosis Date  . DJD (degenerative joint disease) of lumbar spine   . Chronic kidney disease, stage I   . Esophageal reflux   . Other and unspecified hyperlipidemia   . Other primary cardiomyopathies   . Obesity, unspecified   . Unspecified hypothyroidism   . Type I (juvenile type) diabetes mellitus without mention of complication, not stated as uncontrolled   . Essential hypertension, benign   . 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 Pain in joint, pelvic region and thigh   . Insomnia, unspecified   . Iron deficiency 10/04/2011  . Anemia   . Cancer   . CHF (congestive heart failure)   . COPD (chronic obstructive pulmonary disease)   . Heart murmur     Medications:  Prescriptions prior to admission  Medication Sig Dispense Refill  . ADVAIR DISKUS 100-50 MCG/DOSE AEPB inhale 1 dose by mouth twice a day as directed  60 each  4  . allopurinol (ZYLOPRIM) 100 MG tablet Take 100 mg by mouth daily.      . Calcium Carbonate (CALCIUM 600 PO) Take 900 mg by mouth daily. Patient takes 1&1/2 tablet daily      . DUONEB 0.5-2.5 (3) MG/3ML SOLN USE 1 VIAL IN NEBULIZER 3 TIMES DAILY  3 mL  270    . ezetimibe-simvastatin (VYTORIN) 10-20 MG per tablet Take 1 tablet by mouth 3 (three) times a week.       . ferrous sulfate 325 (65 FE) MG tablet Take 325 mg by mouth daily with breakfast.       . folic acid (FOLVITE) 1 MG tablet take 1 tablet by mouth once daily  100 tablet  6  . furosemide (LASIX) 40 MG tablet Take 1 tablet (40 mg total) by mouth daily.  90 tablet  3  . gabapentin (NEURONTIN) 100 MG capsule Take 100-300 mg by mouth at bedtime. For leg pain and burning      . glipiZIDE (GLUCOTROL XL) 2.5 MG 24 hr tablet Take 2.5 mg by mouth daily.      Marland Kitchen glucose blood (FREESTYLE TEST STRIPS) test strip Use as instructed once daily with Freestyle Lite meter  50 each  11  . HYDROcodone-acetaminophen (NORCO) 7.5-325 MG per tablet Take 1 tablet by mouth every 4 (four) hours as needed. pain      . isosorbide-hydrALAZINE (BIDIL) 20-37.5 MG per tablet Take 1 tablet by mouth 3 (three) times daily.  270 tablet  2  . LANOXIN 0.125 MG tablet Take 1 tablet (125 mcg total) by mouth daily.  90 tablet  1  . levothyroxine (SYNTHROID, LEVOTHROID) 75 MCG tablet Take 1  tablet (75 mcg total) by mouth daily.  90 tablet  3  . loratadine (CLARITIN REDITABS) 10 MG dissolvable tablet Take 10 mg by mouth daily as needed. allergies      . losartan (COZAAR) 50 MG tablet Take 50 mg by mouth daily.       . metoprolol succinate (TOPROL-XL) 100 MG 24 hr tablet Take 100 mg by mouth daily. Take with or immediately following a meal.      . omeprazole (PRILOSEC) 40 MG capsule Take 40 mg by mouth daily.        . potassium chloride SA (K-DUR,KLOR-CON) 20 MEQ tablet Take 30 mEq by mouth daily. 1 1/2 tablet daily      . promethazine (PHENERGAN) 25 MG tablet TAKE 1 TABLET BY MOUTH ONCE DAILY AS NEEDED  90 tablet  1  . temazepam (RESTORIL) 30 MG capsule TAKE 1 CAPSULE AT BEDTIME AS NEEDED FOR SLEEP  30 capsule  3  . warfarin (COUMADIN) 5 MG tablet Take 1 tablet (5 mg total) by mouth daily. Mondays and Friday 5 mg Other days 7.5 mg  90  tablet  0    Assessment: 78 yo female admitted with fever, possible UTI for Levaquin, h/o valve replacement to continue anticoagulation INR 4.15 on admit.  Goal of Therapy:  INR 2.5-3.5   Plan:  Levaquin 500 mg IV q48h Hold Coumadin for now F/U daily INR and redose as indicated.  [name] [name], [name] [name] 03/21/2012,10:57 PM

## 2012-03-22 ENCOUNTER — Encounter (HOSPITAL_COMMUNITY): Payer: Self-pay | Admitting: Cardiology

## 2012-03-22 DIAGNOSIS — I5023 Acute on chronic systolic (congestive) heart failure: Secondary | ICD-10-CM | POA: Diagnosis present

## 2012-03-22 DIAGNOSIS — I509 Heart failure, unspecified: Secondary | ICD-10-CM

## 2012-03-22 DIAGNOSIS — Z95810 Presence of automatic (implantable) cardiac defibrillator: Secondary | ICD-10-CM | POA: Diagnosis present

## 2012-03-22 DIAGNOSIS — I2489 Other forms of acute ischemic heart disease: Secondary | ICD-10-CM

## 2012-03-22 DIAGNOSIS — N184 Chronic kidney disease, stage 4 (severe): Secondary | ICD-10-CM | POA: Diagnosis present

## 2012-03-22 DIAGNOSIS — J9600 Acute respiratory failure, unspecified whether with hypoxia or hypercapnia: Secondary | ICD-10-CM

## 2012-03-22 DIAGNOSIS — I248 Other forms of acute ischemic heart disease: Secondary | ICD-10-CM

## 2012-03-22 DIAGNOSIS — G9340 Encephalopathy, unspecified: Secondary | ICD-10-CM

## 2012-03-22 DIAGNOSIS — J189 Pneumonia, unspecified organism: Secondary | ICD-10-CM

## 2012-03-22 DIAGNOSIS — Z952 Presence of prosthetic heart valve: Secondary | ICD-10-CM

## 2012-03-22 DIAGNOSIS — I1310 Hypertensive heart and chronic kidney disease without heart failure, with stage 1 through stage 4 chronic kidney disease, or unspecified chronic kidney disease: Secondary | ICD-10-CM | POA: Diagnosis present

## 2012-03-22 DIAGNOSIS — R778 Other specified abnormalities of plasma proteins: Secondary | ICD-10-CM | POA: Diagnosis present

## 2012-03-22 DIAGNOSIS — N179 Acute kidney failure, unspecified: Secondary | ICD-10-CM

## 2012-03-22 DIAGNOSIS — J441 Chronic obstructive pulmonary disease with (acute) exacerbation: Secondary | ICD-10-CM | POA: Diagnosis present

## 2012-03-22 LAB — GLUCOSE, CAPILLARY
Glucose-Capillary: 108 mg/dL — ABNORMAL HIGH (ref 70–99)
Glucose-Capillary: 119 mg/dL — ABNORMAL HIGH (ref 70–99)
Glucose-Capillary: 120 mg/dL — ABNORMAL HIGH (ref 70–99)
Glucose-Capillary: 134 mg/dL — ABNORMAL HIGH (ref 70–99)
Glucose-Capillary: 164 mg/dL — ABNORMAL HIGH (ref 70–99)
Glucose-Capillary: 179 mg/dL — ABNORMAL HIGH (ref 70–99)
Glucose-Capillary: 208 mg/dL — ABNORMAL HIGH (ref 70–99)

## 2012-03-22 LAB — CARDIAC PANEL(CRET KIN+CKTOT+MB+TROPI)
CK, MB: 1.9 ng/mL (ref 0.3–4.0)
CK, MB: 2.4 ng/mL (ref 0.3–4.0)
Relative Index: 0.3 (ref 0.0–2.5)
Relative Index: 0.4 (ref 0.0–2.5)
Total CK: 640 U/L — ABNORMAL HIGH (ref 7–177)
Total CK: 564 U/L — ABNORMAL HIGH (ref 7–177)
Troponin I: 0.58 ng/mL (ref <0.30)
Troponin I: 0.34 ng/mL (ref <0.30)

## 2012-03-22 LAB — CBC WITH DIFFERENTIAL/PLATELET
Basophils Absolute: 0 10*3/uL (ref 0.0–0.1)
Basophils Relative: 0 % (ref 0–1)
Eosinophils Absolute: 0 10*3/uL (ref 0.0–0.7)
Eosinophils Relative: 0 % (ref 0–5)
HCT: 29.5 % — ABNORMAL LOW (ref 36.0–46.0)
Hemoglobin: 9.8 g/dL — ABNORMAL LOW (ref 12.0–15.0)
Lymphocytes Relative: 3 % — ABNORMAL LOW (ref 12–46)
Lymphs Abs: 0.4 10*3/uL — ABNORMAL LOW (ref 0.7–4.0)
MCH: 30.1 pg (ref 26.0–34.0)
MCHC: 33.2 g/dL (ref 30.0–36.0)
MCV: 90.5 fL (ref 78.0–100.0)
Monocytes Absolute: 0.5 10*3/uL (ref 0.1–1.0)
Monocytes Relative: 4 % (ref 3–12)
Neutro Abs: 10.5 10*3/uL — ABNORMAL HIGH (ref 1.7–7.7)
Neutrophils Relative %: 93 % — ABNORMAL HIGH (ref 43–77)
Platelets: 212 10*3/uL (ref 150–400)
RBC: 3.26 MIL/uL — ABNORMAL LOW (ref 3.87–5.11)
RDW: 14.6 % (ref 11.5–15.5)
WBC: 11.3 10*3/uL — ABNORMAL HIGH (ref 4.0–10.5)

## 2012-03-22 LAB — CBC
HCT: 32 % — ABNORMAL LOW (ref 36.0–46.0)
Hemoglobin: 10.5 g/dL — ABNORMAL LOW (ref 12.0–15.0)
MCH: 29.9 pg (ref 26.0–34.0)
MCHC: 32.8 g/dL (ref 30.0–36.0)
MCV: 91.2 fL (ref 78.0–100.0)
Platelets: 216 10*3/uL (ref 150–400)
RBC: 3.51 MIL/uL — ABNORMAL LOW (ref 3.87–5.11)
RDW: 14.4 % (ref 11.5–15.5)
WBC: 10.4 10*3/uL (ref 4.0–10.5)

## 2012-03-22 LAB — CREATININE, SERUM
Creatinine, Ser: 2.33 mg/dL — ABNORMAL HIGH (ref 0.50–1.10)
GFR calc Af Amer: 22 mL/min — ABNORMAL LOW (ref >90)
GFR calc non Af Amer: 19 mL/min — ABNORMAL LOW (ref >90)

## 2012-03-22 LAB — COMPREHENSIVE METABOLIC PANEL
ALT: 21 U/L (ref 0–35)
AST: 65 U/L — ABNORMAL HIGH (ref 0–37)
Albumin: 3.1 g/dL — ABNORMAL LOW (ref 3.5–5.2)
Alkaline Phosphatase: 42 U/L (ref 39–117)
BUN: 58 mg/dL — ABNORMAL HIGH (ref 6–23)
CO2: 25 mEq/L (ref 19–32)
Calcium: 9.5 mg/dL (ref 8.4–10.5)
Chloride: 97 mEq/L (ref 96–112)
Creatinine, Ser: 2.56 mg/dL — ABNORMAL HIGH (ref 0.50–1.10)
GFR calc Af Amer: 20 mL/min — ABNORMAL LOW (ref >90)
GFR calc non Af Amer: 17 mL/min — ABNORMAL LOW (ref >90)
Glucose, Bld: 131 mg/dL — ABNORMAL HIGH (ref 70–99)
Potassium: 4.3 mEq/L (ref 3.5–5.1)
Sodium: 137 mEq/L (ref 135–145)
Total Bilirubin: 0.9 mg/dL (ref 0.3–1.2)
Total Protein: 7.7 g/dL (ref 6.0–8.3)

## 2012-03-22 LAB — HEMOGLOBIN A1C
Hgb A1c MFr Bld: 6.6 % — ABNORMAL HIGH (ref <5.7)
Mean Plasma Glucose: 143 mg/dL — ABNORMAL HIGH (ref <117)

## 2012-03-22 LAB — PROTIME-INR
INR: 5.19 (ref 0.00–1.49)
Prothrombin Time: 48.5 seconds — ABNORMAL HIGH (ref 11.6–15.2)

## 2012-03-22 LAB — STREP PNEUMONIAE URINARY ANTIGEN: Strep Pneumo Urinary Antigen: NEGATIVE

## 2012-03-22 LAB — DIGOXIN LEVEL: Digoxin Level: 0.5 ng/mL — ABNORMAL LOW (ref 0.8–2.0)

## 2012-03-22 MED ORDER — AZTREONAM 1 G IJ SOLR
1.0000 g | Freq: Three times a day (TID) | INTRAMUSCULAR | Status: DC
  Administered 2012-03-22 – 2012-03-25 (×10): 1 g via INTRAVENOUS
  Filled 2012-03-22 (×12): qty 1

## 2012-03-22 MED ORDER — IPRATROPIUM BROMIDE 0.02 % IN SOLN
0.5000 mg | RESPIRATORY_TRACT | Status: DC
  Administered 2012-03-22 – 2012-03-23 (×8): 0.5 mg via RESPIRATORY_TRACT
  Filled 2012-03-22 (×8): qty 2.5

## 2012-03-22 MED ORDER — FENTANYL CITRATE 0.05 MG/ML IJ SOLN
INTRAMUSCULAR | Status: AC
  Administered 2012-03-22: 25 ug
  Filled 2012-03-22: qty 2

## 2012-03-22 MED ORDER — FENTANYL CITRATE 0.05 MG/ML IJ SOLN
25.0000 ug | INTRAMUSCULAR | Status: DC | PRN
  Administered 2012-03-28: 25 ug via INTRAVENOUS
  Filled 2012-03-22 (×2): qty 2

## 2012-03-22 MED ORDER — ALBUTEROL SULFATE (5 MG/ML) 0.5% IN NEBU: 2.5000 mg | INHALATION_SOLUTION | RESPIRATORY_TRACT | Status: DC | PRN

## 2012-03-22 MED ORDER — ASPIRIN 325 MG PO TABS
325.0000 mg | ORAL_TABLET | Freq: Every day | ORAL | Status: DC
  Administered 2012-03-22 – 2012-03-30 (×9): 325 mg via ORAL
  Filled 2012-03-22 (×10): qty 1

## 2012-03-22 MED ORDER — ALBUTEROL SULFATE (5 MG/ML) 0.5% IN NEBU
2.5000 mg | INHALATION_SOLUTION | RESPIRATORY_TRACT | Status: DC
  Administered 2012-03-22 – 2012-03-23 (×8): 2.5 mg via RESPIRATORY_TRACT
  Filled 2012-03-22 (×8): qty 0.5

## 2012-03-22 MED ORDER — IPRATROPIUM BROMIDE 0.02 % IN SOLN: 0.5000 mg | RESPIRATORY_TRACT | Status: DC | PRN

## 2012-03-22 MED ORDER — METHYLPREDNISOLONE SODIUM SUCC 125 MG IJ SOLR
80.0000 mg | Freq: Two times a day (BID) | INTRAMUSCULAR | Status: DC
  Administered 2012-03-22 – 2012-03-23 (×3): 80 mg via INTRAVENOUS
  Filled 2012-03-22 (×2): qty 1.28
  Filled 2012-03-22: qty 2
  Filled 2012-03-22 (×2): qty 1.28

## 2012-03-22 NOTE — Progress Notes (Signed)
Admit Complaint: shortness of breath  Anticoagulation  Coumadin 5 mg TWRSS 7.5 mg MF for mech AVR/MVR (Goal 2.5 - 3.5); admitting INR 4.15. INR today 5.19.  Plan:  1) Hold coumadin tonight 2) INR in am  Infectious Disease  Suspected PNA. PCN allergy. Levaquin for possible UTI. Added aztreonam. MRSA negative. CKD.  06/27: levauquin>> 06/28: aztreonam  >>  06/27: ucx >> pending 06/27 blood cx x2 >> pending

## 2012-03-22 NOTE — Consult Note (Signed)
Name: [name] [name] MRN: [id number] DOB: 12/14/1931    LOS: 1  Referring Provider:  [facility] Reason for Referral:  Acute respiratory failure  PULMONARY / CRITICAL CARE MEDICINE  The patient is mechanically ventilated with BiPAP and unable to provide history, which was obtained for available medical records.  HPI:  78 yo with COPD and ischemic cardiomyopathy who presented to [facility] with progressive dyspnea for 2 days.  She had some nonproductive cough but no chest pain. She was not compliant with her Lasix for several days prior to admission because of frequency and nocturia.  Her CXR demon started pulmonary congestion and  Pro BNP was elevated.  She was febrile.  She was treated with Lasix and placed on BiPAP. She was transferred to [facility] for farther management.  Past Medical History  Diagnosis Date  . DJD (degenerative joint disease) of lumbar spine   . Chronic kidney disease, stage I   . Esophageal reflux   . Other and unspecified hyperlipidemia   . Other primary cardiomyopathies   . Obesity, unspecified   . Unspecified hypothyroidism   . Type I (juvenile type) diabetes mellitus without mention of complication, not stated as uncontrolled   . Essential hypertension, benign   . 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 Pain in joint, pelvic region and thigh   . Insomnia, unspecified   . Iron deficiency 10/04/2011  . Anemia   . Cancer   . CHF (congestive heart failure)   . COPD (chronic obstructive pulmonary disease)   . Heart murmur    Past Surgical History  Procedure Date  . Thyroidectomy   . Pacemaker placed 2004  . Aortic and mitral valve replacement 2001  . Right breast cyst removed     benign   . Right cataract removed     2005  . Defibrillator implanted 2006    Prior to Admission medications   Medication Sig Start Date End Date Taking? Authorizing Provider  ADVAIR DISKUS 100-50 MCG/DOSE AEPB inhale 1 dose by mouth twice a day as directed 01/31/12  Yes  Fayrene Helper, MD  allopurinol (ZYLOPRIM) 100 MG tablet Take 100 mg by mouth daily.   Yes Historical Provider, MD  Calcium Carbonate (CALCIUM 600 PO) Take 900 mg by mouth daily. Patient takes 1&1/2 tablet daily   Yes Historical Provider, MD  DUONEB 0.5-2.5 (3) MG/3ML SOLN USE 1 VIAL IN NEBULIZER 3 TIMES DAILY 09/04/11  Yes Fayrene Helper, MD  ezetimibe-simvastatin (VYTORIN) 10-20 MG per tablet Take 1 tablet by mouth 3 (three) times a week.    Yes Historical Provider, MD  ferrous sulfate 325 (65 FE) MG tablet Take 325 mg by mouth daily with breakfast.    Yes Historical Provider, MD  folic acid (FOLVITE) 1 MG tablet take 1 tablet by mouth once daily 01/07/12  Yes Pieter Partridge, MD  furosemide (LASIX) 40 MG tablet Take 1 tablet (40 mg total) by mouth daily. 02/22/12  Yes Fayrene Helper, MD  gabapentin (NEURONTIN) 100 MG capsule Take 100-300 mg by mouth at bedtime. For leg pain and burning 10/25/11  Yes Fayrene Helper, MD  glipiZIDE (GLUCOTROL XL) 2.5 MG 24 hr tablet Take 2.5 mg by mouth daily. 02/22/12  Yes Fayrene Helper, MD  glucose blood (FREESTYLE TEST STRIPS) test strip Use as instructed once daily with Freestyle Lite meter 03/04/12  Yes Fayrene Helper, MD  HYDROcodone-acetaminophen (NORCO) 7.5-325 MG per tablet Take 1 tablet by mouth every 4 (  four) hours as needed. pain   Yes Historical Provider, MD  isosorbide-hydrALAZINE (BIDIL) 20-37.5 MG per tablet Take 1 tablet by mouth 3 (three) times daily. 02/22/12  Yes Fayrene Helper, MD  LANOXIN 0.125 MG tablet Take 1 tablet (125 mcg total) by mouth daily. 02/22/12 02/21/13 Yes Fayrene Helper, MD  levothyroxine (SYNTHROID, LEVOTHROID) 75 MCG tablet Take 1 tablet (75 mcg total) by mouth daily. 02/22/12  Yes Fayrene Helper, MD  loratadine (CLARITIN REDITABS) 10 MG dissolvable tablet Take 10 mg by mouth daily as needed. allergies 02/14/11  Yes Fayrene Helper, MD  losartan (COZAAR) 50 MG tablet Take 50 mg by mouth daily.   03/06/11  Yes Historical Provider, MD  metoprolol succinate (TOPROL-XL) 100 MG 24 hr tablet Take 100 mg by mouth daily. Take with or immediately following a meal.   Yes Historical Provider, MD  omeprazole (PRILOSEC) 40 MG capsule Take 40 mg by mouth daily.     Yes Historical Provider, MD  potassium chloride SA (K-DUR,KLOR-CON) 20 MEQ tablet Take 30 mEq by mouth daily. 1 1/2 tablet daily 02/22/12  Yes Fayrene Helper, MD  promethazine (PHENERGAN) 25 MG tablet TAKE 1 TABLET BY MOUTH ONCE DAILY AS NEEDED 09/12/11  Yes Fayrene Helper, MD  temazepam (RESTORIL) 30 MG capsule TAKE 1 CAPSULE AT BEDTIME AS NEEDED FOR SLEEP 10/25/11  Yes Fayrene Helper, MD  warfarin (COUMADIN) 5 MG tablet Take 1 tablet (5 mg total) by mouth daily. Mondays and Friday 5 mg Other days 7.5 mg 02/22/12  Yes Fayrene Helper, MD   Allergies Allergies  Allergen Reactions  . Niacin     REACTION: itching  . Penicillins    Family History Family History  Problem Relation Age of Onset  . Cancer Mother     behind pancres  . Heart disease Father   . Heart disease Sister     Heart attack  . Heart disease Brother   . Diabetes Brother    Social History  reports that she quit smoking about 25 years ago. She does not have any smokeless tobacco history on file. She reports that she does not drink alcohol or use illicit drugs.  Review Of Systems:  Unable to provide.  Brief patient description:  78 yo with COPD / CHF / noncompliant with Lasix admitted for acute respiratory failure requiring BiPAP.  Events Since Admission: 6/27  Admitted for acute respiratory failure requiring BiPAP  Current Status:  Vital Signs: Temp:  [98.1 F (36.7 C)-103.4 F (39.7 C)] 102.7 F (39.3 C) (06/28 0000) Pulse Rate:  [95-112] 104  (06/28 0000) Resp:  [20-43] 38  (06/28 0000) BP: (111-162)/(53-86) 133/53 mmHg (06/28 0000) SpO2:  [88 %-99 %] 97 % (06/28 0000) Weight:  [110.224 kg (243 lb)] 110.224 kg (243 lb) (06/27  1101)  Physical Examination: General:  Mild distress, synchronous with BiPAP Neuro:  Somnolent, but arpusable HEENT:  PERRL, BiPAP mask on Neck:  Could not assess JVD Cardiovascular:  Regular, tachycardic Lungs:  Diminished air entry bilaterally, few exp wheezes / rales Abdomen:  Soft Musculoskeletal:  Trace   Active Problems:  DIABETES MELLITUS, TYPE II  Acute respiratory failure  Warfarin-induced coagulopathy  H/O aortic valve replacement  Acute on chronic renal failure  CAP (community acquired pneumonia)  Acute exacerbation of CHF (congestive heart failure)  COPD exacerbation  Demand ischemia  Encephalopathy acute  ASSESSMENT AND PLAN  PULMONARY  Lab 03/21/12 1950  PHART 7.415*  PCO2ART 40.5  PO2ART 77.5*  HCO3 25.5*  O2SAT 95.3   Ventilator Settings:   CXR:  6/27 >>> Cardiac enlargement of pulmonary vascular congestion and infiltration, atelectasis, or edema in the right lung base.  Probable small right pleural effusion. No significant progression since the previous study.  ETT:  NA  A:  Acute hypoxemic respiratory failure.  Possible COPD exacerbation.  Possible pneumonia (CAP). P:   Repeat CXR in AM Albuterol / Atrovent D/c Pulmicort Start Solu-Medrol ABx /Cx per ID section  CARDIOVASCULAR  Lab 03/21/12 2253 03/21/12 2247 03/21/12 1119  TROPONINI 0.77* -- 0.43*  LATICACIDVEN -- 0.9 --  PROBNP [id number]* -- 34990.0*   ECG:  Paced rhythm Lines: NA  A: Non-ischemic cardiomyopathy (EF < 25% 2012).  Possible CHF (acute systolic) exacerbation.  Demand ischemia vs ACS.  History of mitral / aortic valve replacement. P:  Trend cardiac enzymes ASA, Metoprolol, Vytorin, Bidil  Holding Heparin as coagulopathic Holding Losartan / Digoxin as acute renal failure TTE Lasix  RENAL  Lab 03/21/12 2245 03/21/12 1119  NA -- 133*  K -- 4.6  CL -- 96  CO2 -- 22  BUN -- 48*  CREATININE 2.33* 2.22*  CALCIUM -- 10.1  MG -- --  PHOS -- --   Intake/Output       06/27 0701 - 06/28 0700   Urine (mL/kg/hr) 975 (0.4)   Total Output 975   Net -975        Foley:  6/28 >>>  A:  Acute on chronic renal failure P:   Trend BMP Holding IVF as suspected CHF exacerbation on diuretics  GASTROINTESTINAL  Lab 03/21/12 1119  AST 74*  ALT 20  ALKPHOS 45  BILITOT 0.9  PROT 8.5*  ALBUMIN 3.6   A:  No active issues. P:   NPO for possible intubation  HEMATOLOGIC  Lab 03/21/12 2245 03/21/12 1119  HGB 10.5* 9.7*  HCT 32.0* 30.2*  PLT 216 222  INR -- 4.15*  APTT -- 79*   A:  Stable anemia.  Coumadin-induced coagulopathy.  No overt hemorrhage. P:  Coumadin per pharmacy Trend CBC Ferrous sulfate / folate  INFECTIOUS  Lab 03/21/12 2245 03/21/12 2244 03/21/12 1119  WBC 10.4 -- 11.9*  PROCALCITON -- 22.68 --   Cultures: 6/27  Blood >>> 6/27  Urine >>> Antibiotics: Levaquin 6/27 >>> Aztreonam 6/28 >>>  A:  Suspected pneumonia.  PCN allergy. P:   Add Aztreonam Urine Strep / Legionella Ag  ENDOCRINE  Lab 03/21/12 2317  GLUCAP 119*   A:  IDDM. Hypothyroidism. P:   SSI/CBG Hold Glipizide as NPO Synthroid  NEUROLOGIC  A:  Acute encephalopathy, multifactorial. P:   Hold Gabapentin, Norco, Restoril   BEST PRACTICE / DISPOSITION Level of Care:  SDU Primary Service:  [place] Consultants:  Cardiology, PCCM Code Status:  Full Diet:  NPO DVT Px:  Not indicated (coagulopathic) GI Px:  Protonix Skin Integrity:  Intact  Social / Family:  Not available  The patient is critically ill with multiple organ systems failure and requires high complexity decision making for assessment and support, frequent evaluation and titration of therapies, application of advanced monitoring technologies and extensive interpretation of multiple databases. Critical Care Time devoted to patient care services described in this note is 35 minutes.  Doree Fudge, M.D. Pulmonary and [place] Pager: [phone]  03/22/2012, 1:25 AM

## 2012-03-22 NOTE — Consult Note (Addendum)
Pt. Seen and examined. Agree with the NP/PA-C note as written. Complex patient with a history of non-ischemic cardiomyopathy, EF 25%, s/p AVR/MVR which are mechanical valves, s/p PPM placement for complete heart block in [place] with AICD upgrade in 2006. She had a generator and lead (sprint fidelis) change in 2011. She now presents with 4-5 days of increasing dypsnea on exertion, orthopnea and PND.  Initial work-up shows significant pulmonary edema requiring bipap, elevated troponin commensurate with CHF exacerbation (not likely NSTEMI) which is decreasing and supratherapeutic INR on warfarin. Pro-BNP was almost 30K. Acute on CKD II (Creatinine 1.6 in 5/13), probable due to cardiorenal syndrome. There was also fever, leukocytosis and a left shift on admission. PCCM is following for this and she is on antibiotics and steroids. Diuresing well on IV lasix.  2D echo is pending.  Also note that she is having hemoptysis - probably due to CHF, PNA and high INR.  Pixie Casino, MD, [facility] Attending Cardiologist [facility]

## 2012-03-22 NOTE — Care Management Note (Addendum)
    Page 1 of 1   03/25/2012     3:02:03 PM   CARE MANAGEMENT NOTE 03/25/2012  Patient:  Demarcus,Ramie   Account Number:  [id number]  Date Initiated:  03/22/2012  Documentation initiated by:  Elissa Hefty  Subjective/Objective Assessment:   adm w heart failure     Action/Plan:   lives w brother, pcp dr [name] simpson   Anticipated DC Date:     Anticipated DC Plan:        [place]  CM consult      [place]   Choice offered to / List presented to:  C-1 Patient        [place] arranged  HH-1 RN  [place].   Status of service:   Medicare Important Message given?   (If response is "NO", the following Medicare IM given date fields will be blank) Date Medicare IM given:   Date Additional Medicare IM given:    Discharge Disposition:    Per UR Regulation:  Reviewed for med. necessity/level of care/duration of stay  If discussed at [place] of Stay Meetings, dates discussed:    Comments:  7/1 14:00 debbie Kelbi Renstrom rn,bsn on iv antibiotic, iv lasix and iv hep.spoke w pt and gave her [facility]hc agency list.she had hhc in [place]. no pref. ref to [employer]. also put copy of hhc agency list in chart.  6/28 10:25a debbie Hang [place] rn,bsn [id number]

## 2012-03-22 NOTE — Consult Note (Signed)
Reason for Consult: Resp. Failure with hx. Of CM  Referring Physician: [facility]   [name] [name] is an 78 y.o. female.    Chief Complaint: SOB last pm    HPI: 78 year-old female with known history of nonischemic cardiomyopathy last EF measured was in 2011 was 25%, history of pacemaker and ICD placement, history of mechanical mitral valve aortic valve on Coumadin, COPD, diabetes mellitus type 2 presented to the ER at [facility] with shortness of breath. Patient had been experiencing shortness of breath over the last 4 days which is exertional and has been worsening. Has nonproductive cough and denies any chest pain associated with. In the ER patient was found to be in respiratory distress and was placed on BiPAP. Chest x-rays are showing congestion and labs show increased BNP and also positive troponin. ER physician had contacted [facility] heart and vascular cardiologist and they have requested transfer to [facility] for further management. When examined, on BiPAP, she was mildly lethargic but not in distress. Patient was able to talk without discomfort and denies any chest pain. Does have some suprapubic pain it is a burning in sensation though denies any nausea vomiting or diarrhea. Specifically denies any chest pain. Patient was found to be febrile.  CCM is also following.  Pk. Troponin is 0.77 with CK 713 and MB of 2.1  Pro BNP on admit 29,898 INR 4  Sleeping with bipap, wakes but lethargic.    Past Medical History  Diagnosis Date  . DJD (degenerative joint disease) of lumbar spine   . Chronic kidney disease, stage I   . Esophageal reflux   . Other and unspecified hyperlipidemia   . Other primary cardiomyopathies   . Obesity, unspecified   . Unspecified hypothyroidism   . Type I (juvenile type) diabetes mellitus without mention of complication, not stated as uncontrolled   . Essential hypertension, benign   . Varicose veins of lower extremities with  other complications   . Pain in joint, pelvic region and thigh   . Insomnia, unspecified   . Iron deficiency 10/04/2011  . Anemia   . Cancer   . CHF (congestive heart failure)   . COPD (chronic obstructive pulmonary disease)   . Heart murmur   . ICD (implantable cardiac defibrillator) in place     Past Surgical History  Procedure Date  . Thyroidectomy   . Pacemaker placed 2004  . Aortic and mitral valve replacement 2001  . Right breast cyst removed     benign   . Right cataract removed     2005  . Defibrillator implanted 2006   . Cardiac valve replacement   . Cardiac catheterization   . Insert / replace / remove pacemaker     Family History  Problem Relation Age of Onset  . Cancer Mother     behind pancres  . Heart disease Father   . Heart disease Sister     Heart attack  . Heart disease Brother   . Diabetes Brother    Social History:  reports that she quit smoking about 25 years ago. She does not have any smokeless tobacco history on file. She reports that she does not drink alcohol or use illicit drugs.  Allergies:  Allergies  Allergen Reactions  . Niacin     REACTION: itching  . Penicillins     Medications Prior to Admission  Medication Sig Dispense Refill  . ADVAIR DISKUS 100-50 MCG/DOSE AEPB inhale 1 dose by mouth twice  a day as directed  60 each  4  . allopurinol (ZYLOPRIM) 100 MG tablet Take 100 mg by mouth daily.      . Calcium Carbonate (CALCIUM 600 PO) Take 900 mg by mouth daily. Patient takes 1&1/2 tablet daily      . DUONEB 0.5-2.5 (3) MG/3ML SOLN USE 1 VIAL IN NEBULIZER 3 TIMES DAILY  3 mL  270  . ezetimibe-simvastatin (VYTORIN) 10-20 MG per tablet Take 1 tablet by mouth 3 (three) times a week.       . ferrous sulfate 325 (65 FE) MG tablet Take 325 mg by mouth daily with breakfast.       . folic acid (FOLVITE) 1 MG tablet take 1 tablet by mouth once daily  100 tablet  6  . furosemide (LASIX) 40 MG tablet Take 1 tablet (40 mg total) by mouth daily.   90 tablet  3  . gabapentin (NEURONTIN) 100 MG capsule Take 100-300 mg by mouth at bedtime. For leg pain and burning      . glipiZIDE (GLUCOTROL XL) 2.5 MG 24 hr tablet Take 2.5 mg by mouth daily.      Marland Kitchen glucose blood (FREESTYLE TEST STRIPS) test strip Use as instructed once daily with Freestyle Lite meter  50 each  11  . HYDROcodone-acetaminophen (NORCO) 7.5-325 MG per tablet Take 1 tablet by mouth every 4 (four) hours as needed. pain      . isosorbide-hydrALAZINE (BIDIL) 20-37.5 MG per tablet Take 1 tablet by mouth 3 (three) times daily.  270 tablet  2  . LANOXIN 0.125 MG tablet Take 1 tablet (125 mcg total) by mouth daily.  90 tablet  1  . levothyroxine (SYNTHROID, LEVOTHROID) 75 MCG tablet Take 1 tablet (75 mcg total) by mouth daily.  90 tablet  3  . loratadine (CLARITIN REDITABS) 10 MG dissolvable tablet Take 10 mg by mouth daily as needed. allergies      . losartan (COZAAR) 50 MG tablet Take 50 mg by mouth daily.       . metoprolol succinate (TOPROL-XL) 100 MG 24 hr tablet Take 100 mg by mouth daily. Take with or immediately following a meal.      . omeprazole (PRILOSEC) 40 MG capsule Take 40 mg by mouth daily.        . potassium chloride SA (K-DUR,KLOR-CON) 20 MEQ tablet Take 30 mEq by mouth daily. 1 1/2 tablet daily      . promethazine (PHENERGAN) 25 MG tablet TAKE 1 TABLET BY MOUTH ONCE DAILY AS NEEDED  90 tablet  1  . temazepam (RESTORIL) 30 MG capsule TAKE 1 CAPSULE AT BEDTIME AS NEEDED FOR SLEEP  30 capsule  3  . warfarin (COUMADIN) 5 MG tablet Take 1 tablet (5 mg total) by mouth daily. Mondays and Friday 5 mg Other days 7.5 mg  90 tablet  0    Results for orders placed during the hospital encounter of 03/21/12 (from the past 48 hour(s))  CBC WITH DIFFERENTIAL     Status: Abnormal   Collection Time   03/21/12 11:19 AM      Component Value Range Comment   WBC 11.9 (*) 4.0 - 10.5 K/uL    RBC 3.30 (*) 3.87 - 5.11 MIL/uL    Hemoglobin 9.7 (*) 12.0 - 15.0 g/dL    HCT 30.2 (*) 36.0 -  46.0 %    MCV 91.5  78.0 - 100.0 fL    MCH 29.4  26.0 - 34.0 pg    MCHC  32.1  30.0 - 36.0 g/dL    RDW 14.4  11.5 - 15.5 %    Platelets 222  150 - 400 K/uL    Neutrophils Relative 88 (*) 43 - 77 %    Neutro Abs 10.5 (*) 1.7 - 7.7 K/uL    Lymphocytes Relative 4 (*) 12 - 46 %    Lymphs Abs 0.4 (*) 0.7 - 4.0 K/uL    Monocytes Relative 8  3 - 12 %    Monocytes Absolute 0.9  0.1 - 1.0 K/uL    Eosinophils Relative 0  0 - 5 %    Eosinophils Absolute 0.0  0.0 - 0.7 K/uL    Basophils Relative 0  0 - 1 %    Basophils Absolute 0.0  0.0 - 0.1 K/uL   BASIC METABOLIC PANEL     Status: Abnormal   Collection Time   03/21/12 11:19 AM      Component Value Range Comment   Sodium 133 (*) 135 - 145 mEq/L    Potassium 4.6  3.5 - 5.1 mEq/L    Chloride 96  96 - 112 mEq/L    CO2 22  19 - 32 mEq/L    Glucose, Bld 157 (*) 70 - 99 mg/dL    BUN 48 (*) 6 - 23 mg/dL    Creatinine, Ser 2.22 (*) 0.50 - 1.10 mg/dL    Calcium 10.1  8.4 - 10.5 mg/dL    GFR calc non Af Amer 20 (*) >90 mL/min    GFR calc Af Amer 23 (*) >90 mL/min   PRO B NATRIURETIC PEPTIDE     Status: Abnormal   Collection Time   03/21/12 11:19 AM      Component Value Range Comment   Pro B Natriuretic peptide (BNP) 34990.0 (*) 0 - 450 pg/mL   DIGOXIN LEVEL     Status: Abnormal   Collection Time   03/21/12 11:19 AM      Component Value Range Comment   Digoxin Level 0.7 (*) 0.8 - 2.0 ng/mL   APTT     Status: Abnormal   Collection Time   03/21/12 11:19 AM      Component Value Range Comment   aPTT 79 (*) 24 - 37 seconds   PROTIME-INR     Status: Abnormal   Collection Time   03/21/12 11:19 AM      Component Value Range Comment   Prothrombin Time 40.7 (*) 11.6 - 15.2 seconds    INR 4.15 (*) 0.00 - 1.49   HEPATIC FUNCTION PANEL     Status: Abnormal   Collection Time   03/21/12 11:19 AM      Component Value Range Comment   Total Protein 8.5 (*) 6.0 - 8.3 g/dL    Albumin 3.6  3.5 - 5.2 g/dL    AST 74 (*) 0 - 37 U/L    ALT 20  0 - 35 U/L     Alkaline Phosphatase 45  39 - 117 U/L    Total Bilirubin 0.9  0.3 - 1.2 mg/dL    Bilirubin, Direct 0.6 (*) 0.0 - 0.3 mg/dL    Indirect Bilirubin 0.3  0.3 - 0.9 mg/dL   TROPONIN I     Status: Abnormal   Collection Time   03/21/12 11:19 AM      Component Value Range Comment   Troponin I 0.43 (*) <0.30 ng/mL   POCT I-STAT TROPONIN I     Status: Abnormal   Collection Time  03/21/12 11:58 AM      Component Value Range Comment   Troponin i, poc 0.42 (*) 0.00 - 0.08 ng/mL    Comment 3            URINALYSIS, ROUTINE W REFLEX MICROSCOPIC     Status: Abnormal   Collection Time   03/21/12 12:29 PM      Component Value Range Comment   Color, Urine YELLOW  YELLOW    APPearance CLEAR  CLEAR    Specific Gravity, Urine 1.025  1.005 - 1.030    pH 5.5  5.0 - 8.0    Glucose, UA NEGATIVE  NEGATIVE mg/dL    Hgb urine dipstick LARGE (*) NEGATIVE    Bilirubin Urine NEGATIVE  NEGATIVE    Ketones, ur NEGATIVE  NEGATIVE mg/dL    Protein, ur >300 (*) NEGATIVE mg/dL    Urobilinogen, UA 1.0  0.0 - 1.0 mg/dL    Nitrite NEGATIVE  NEGATIVE    Leukocytes, UA NEGATIVE  NEGATIVE   URINE MICROSCOPIC-ADD ON     Status: Abnormal   Collection Time   03/21/12 12:29 PM      Component Value Range Comment   Squamous Epithelial / LPF FEW (*) RARE    WBC, UA 0-2  <3 WBC/hpf    RBC / HPF 3-6  <3 RBC/hpf    Bacteria, UA FEW (*) RARE    Casts HYALINE CASTS (*) NEGATIVE GRANULAR CAST  BLOOD GAS, ARTERIAL     Status: Abnormal   Collection Time   03/21/12  7:50 PM      Component Value Range Comment   FIO2 30.00      Delivery systems BILEVEL POSITIVE AIRWAY PRESSURE      Inspiratory PAP 12      Expiratory PAP 6      pH, Arterial 7.415 (*) 7.350 - 7.400    pCO2 arterial 40.5  35.0 - 45.0 mmHg    pO2, Arterial 77.5 (*) 80.0 - 100.0 mmHg    Bicarbonate 25.5 (*) 20.0 - 24.0 mEq/L    TCO2 23.7  0 - 100 mmol/L    Acid-Base Excess 1.4  0.0 - 2.0 mmol/L    O2 Saturation 95.3      Patient temperature 37.0      Collection  site RIGHT RADIAL      Drawn by [id number]      Sample type ARTERIAL      Allens test (pass/fail) PASS  PASS   MRSA PCR SCREENING     Status: Normal   Collection Time   03/21/12  8:01 PM      Component Value Range Comment   MRSA by PCR NEGATIVE  NEGATIVE   PROCALCITONIN     Status: Normal   Collection Time   03/21/12 10:44 PM      Component Value Range Comment   Procalcitonin 22.68     DIGOXIN LEVEL     Status: Abnormal   Collection Time   03/21/12 10:45 PM      Component Value Range Comment   Digoxin Level 0.5 (*) 0.8 - 2.0 ng/mL   CBC     Status: Abnormal   Collection Time   03/21/12 10:45 PM      Component Value Range Comment   WBC 10.4  4.0 - 10.5 K/uL    RBC 3.51 (*) 3.87 - 5.11 MIL/uL    Hemoglobin 10.5 (*) 12.0 - 15.0 g/dL    HCT 32.0 (*) 36.0 - 46.0 %  MCV 91.2  78.0 - 100.0 fL    MCH 29.9  26.0 - 34.0 pg    MCHC 32.8  30.0 - 36.0 g/dL    RDW 14.4  11.5 - 15.5 %    Platelets 216  150 - 400 K/uL   CREATININE, SERUM     Status: Abnormal   Collection Time   03/21/12 10:45 PM      Component Value Range Comment   Creatinine, Ser 2.33 (*) 0.50 - 1.10 mg/dL    GFR calc non Af Amer 19 (*) >90 mL/min    GFR calc Af Amer 22 (*) >90 mL/min   LACTIC ACID, PLASMA     Status: Normal   Collection Time   03/21/12 10:47 PM      Component Value Range Comment   Lactic Acid, Venous 0.9  0.5 - 2.2 mmol/L   CARDIAC PANEL(CRET KIN+CKTOT+MB+TROPI)     Status: Abnormal   Collection Time   03/21/12 10:53 PM      Component Value Range Comment   Total CK 713 (*) 7 - 177 U/L    CK, MB 2.1  0.3 - 4.0 ng/mL    Troponin I 0.77 (*) <0.30 ng/mL    Relative Index 0.3  0.0 - 2.5   PRO B NATRIURETIC PEPTIDE     Status: Abnormal   Collection Time   03/21/12 10:53 PM      Component Value Range Comment   Pro B Natriuretic peptide (BNP) 29898.0 (*) 0 - 450 pg/mL   GLUCOSE, CAPILLARY     Status: Abnormal   Collection Time   03/21/12 11:17 PM      Component Value Range Comment   Glucose-Capillary 119  (*) 70 - 99 mg/dL   STREP PNEUMONIAE URINARY ANTIGEN     Status: Normal   Collection Time   03/22/12  2:53 AM      Component Value Range Comment   Strep Pneumo Urinary Antigen NEGATIVE  NEGATIVE   GLUCOSE, CAPILLARY     Status: Abnormal   Collection Time   03/22/12  3:48 AM      Component Value Range Comment   Glucose-Capillary 120 (*) 70 - 99 mg/dL   CARDIAC PANEL(CRET KIN+CKTOT+MB+TROPI)     Status: Abnormal   Collection Time   03/22/12  6:07 AM      Component Value Range Comment   Total CK 640 (*) 7 - 177 U/L    CK, MB 1.9  0.3 - 4.0 ng/mL    Troponin I 0.58 (*) <0.30 ng/mL    Relative Index 0.3  0.0 - 2.5   COMPREHENSIVE METABOLIC PANEL     Status: Abnormal   Collection Time   03/22/12  6:07 AM      Component Value Range Comment   Sodium 137  135 - 145 mEq/L    Potassium 4.3  3.5 - 5.1 mEq/L    Chloride 97  96 - 112 mEq/L    CO2 25  19 - 32 mEq/L    Glucose, Bld 131 (*) 70 - 99 mg/dL    BUN 58 (*) 6 - 23 mg/dL    Creatinine, Ser 2.56 (*) 0.50 - 1.10 mg/dL    Calcium 9.5  8.4 - 10.5 mg/dL    Total Protein 7.7  6.0 - 8.3 g/dL    Albumin 3.1 (*) 3.5 - 5.2 g/dL    AST 65 (*) 0 - 37 U/L    ALT 21  0 - 35 U/L  Alkaline Phosphatase 42  39 - 117 U/L    Total Bilirubin 0.9  0.3 - 1.2 mg/dL    GFR calc non Af Amer 17 (*) >90 mL/min    GFR calc Af Amer 20 (*) >90 mL/min   CBC WITH DIFFERENTIAL     Status: Abnormal   Collection Time   03/22/12  6:07 AM      Component Value Range Comment   WBC 11.3 (*) 4.0 - 10.5 K/uL    RBC 3.26 (*) 3.87 - 5.11 MIL/uL    Hemoglobin 9.8 (*) 12.0 - 15.0 g/dL    HCT 29.5 (*) 36.0 - 46.0 %    MCV 90.5  78.0 - 100.0 fL    MCH 30.1  26.0 - 34.0 pg    MCHC 33.2  30.0 - 36.0 g/dL    RDW 14.6  11.5 - 15.5 %    Platelets 212  150 - 400 K/uL    Neutrophils Relative 93 (*) 43 - 77 %    Neutro Abs 10.5 (*) 1.7 - 7.7 K/uL    Lymphocytes Relative 3 (*) 12 - 46 %    Lymphs Abs 0.4 (*) 0.7 - 4.0 K/uL    Monocytes Relative 4  3 - 12 %    Monocytes  Absolute 0.5  0.1 - 1.0 K/uL    Eosinophils Relative 0  0 - 5 %    Eosinophils Absolute 0.0  0.0 - 0.7 K/uL    Basophils Relative 0  0 - 1 %    Basophils Absolute 0.0  0.0 - 0.1 K/uL   PROTIME-INR     Status: Abnormal   Collection Time   03/22/12  6:07 AM      Component Value Range Comment   Prothrombin Time 48.5 (*) 11.6 - 15.2 seconds    INR 5.19 (*) 0.00 - 1.49    Dg Chest Port 1 View  03/21/2012  *RADIOLOGY REPORT*  Clinical Data: Difficulty breathing.  Fever.  Chest pain.  PORTABLE CHEST - 1 VIEW  Comparison: 03/21/2012 at 1115 hours  Findings: Stable appearance of postoperative changes in the mediastinum as well as cardiac pacemaker leads.  Rotated patient limits technique.  Heart size is enlarged.  Mild pulmonary vascular congestion.  Infiltration in the right lung base could represent edema, pneumonia, or atelectasis.  Mild blunting of the right costophrenic angles suggesting small effusion.  No pneumothorax. Calcification of the aorta.  IMPRESSION: Cardiac enlargement of pulmonary vascular congestion and infiltration, atelectasis, or edema in the right lung base. Probable small right pleural effusion.  No significant progression since the previous study.  Original Report Authenticated By: Neale Burly, M.D.   Dg Chest Port 1 View  03/21/2012  *RADIOLOGY REPORT*  Clinical Data: Shortness of breath.  PORTABLE CHEST - 1 VIEW  Comparison: 11/20/2011  Findings: Stable enlargement of the cardiac silhouette.  Post cardiac surgery changes with a left cardiac ICD.  Increased densities in the right lower lung could be related to overlying soft tissues but also concerning for dependent edema.  Upper lungs are clear.  IMPRESSION: Increased densities in the right lower lung.  Findings could be associated with asymmetric dependent edema.  These densities may be accentuated by overlying soft tissue.  Cardiomegaly with post surgical changes.  Original Report Authenticated By: Markus Daft, M.D.    ROS:  General:Pt lethargic and unable to answer questions, she just falls back to sleep.  On previous ROS she had only complained of cough and SOB.  Blood pressure 109/46, pulse 80, temperature 99.3 F (37.4 C), temperature source Axillary, resp. rate 35, height 5\' 6"  (1.676 m), weight 107.1 kg (236 lb 1.8 oz), SpO2 98.00%. PE: General:Lethargic, wakens when touched and name called., BiPap in place. Does state she feels better and no chest pain.  Skin:W&D, brisk capillary refill Neck: mild JVD with HJR on rt., no carotid bruits  HEENT:normocephalic, sclera clear [id number] RRR with soft 2/6 systolic murmur and click of valve. [id number] ant. Rare wheeze GI:abd soft, non tender, + BS, no masses palpated EXT: no edema -pitting, 2+ pedal pulses Neuro:When awakens she is aware of surroundings, but lethargic     Assessment/Plan Patient Active Problem List  Diagnosis  . HYPOTHYROIDISM  . DIABETES MELLITUS, TYPE II  . HYPERLIPIDEMIA  . DEHYDRATION  . OBESITY  . HYPERTENSION  . CARDIOMYOPATHY  . UNSPECIFIED CARDIOVASCULAR DISEASE  . VARICOSE VEINS LOWER EXTREMITIES W/OTH COMPS  . GERD  . CHOLELITHIASIS  . CKD (chronic kidney disease) stage 4, GFR 15-29 ml/min  . ACUTE CYSTITIS  . OTHER SIGN AND SYMPTOM IN BREAST  . HIP PAIN, RIGHT  . DEGENERATIVE JOINT DISEASE, SPINE  . INSOMNIA  . FATIGUE  . NAUSEA  . NECK SPASM  . HEART VALVE REPLACED BY OTHER MEANS  . HEADACHE  . Allergic rhinitis  . Low back pain  . Iron deficiency  . Papilloma of breast  . Acute respiratory failure  . Warfarin-induced coagulopathy  . H/O aortic valve replacement  . Acute on chronic renal failure  . CAP (community acquired pneumonia)  . Acute exacerbation of CHF (congestive heart failure)  . COPD exacerbation  . Demand ischemia  . Encephalopathy acute   PLAN: Will call for office records, BP somewhat labile with lows of 92 systolic to 0000000 ,  EKG was ventricular pacing with ? Underlying block.       Maliya Marich R 03/22/2012, 7:39 AM

## 2012-03-22 NOTE — Telephone Encounter (Signed)
Noted spoke with Mr [name] [name] today

## 2012-03-22 NOTE — Progress Notes (Signed)
  Echocardiogram 2D Echocardiogram has been performed.  Clydell Alberts FRANCES 03/22/2012, 2:36 PM

## 2012-03-23 ENCOUNTER — Inpatient Hospital Stay (HOSPITAL_COMMUNITY): Payer: Medicare Other

## 2012-03-23 DIAGNOSIS — N184 Chronic kidney disease, stage 4 (severe): Secondary | ICD-10-CM

## 2012-03-23 LAB — BASIC METABOLIC PANEL
BUN: 83 mg/dL — ABNORMAL HIGH (ref 6–23)
CO2: 23 mEq/L (ref 19–32)
Calcium: 9.6 mg/dL (ref 8.4–10.5)
Chloride: 97 mEq/L (ref 96–112)
Creatinine, Ser: 3.01 mg/dL — ABNORMAL HIGH (ref 0.50–1.10)
GFR calc Af Amer: 16 mL/min — ABNORMAL LOW (ref >90)
GFR calc non Af Amer: 14 mL/min — ABNORMAL LOW (ref >90)
Glucose, Bld: 215 mg/dL — ABNORMAL HIGH (ref 70–99)
Potassium: 4.9 mEq/L (ref 3.5–5.1)
Sodium: 136 mEq/L (ref 135–145)

## 2012-03-23 LAB — CBC
HCT: 28.8 % — ABNORMAL LOW (ref 36.0–46.0)
Hemoglobin: 9.6 g/dL — ABNORMAL LOW (ref 12.0–15.0)
MCH: 30.3 pg (ref 26.0–34.0)
MCHC: 33.3 g/dL (ref 30.0–36.0)
MCV: 90.9 fL (ref 78.0–100.0)
Platelets: 237 10*3/uL (ref 150–400)
RBC: 3.17 MIL/uL — ABNORMAL LOW (ref 3.87–5.11)
RDW: 14.5 % (ref 11.5–15.5)
WBC: 12 10*3/uL — ABNORMAL HIGH (ref 4.0–10.5)

## 2012-03-23 LAB — GLUCOSE, CAPILLARY
Glucose-Capillary: 169 mg/dL — ABNORMAL HIGH (ref 70–99)
Glucose-Capillary: 214 mg/dL — ABNORMAL HIGH (ref 70–99)
Glucose-Capillary: 178 mg/dL — ABNORMAL HIGH (ref 70–99)
Glucose-Capillary: 167 mg/dL — ABNORMAL HIGH (ref 70–99)
Glucose-Capillary: 178 mg/dL — ABNORMAL HIGH (ref 70–99)

## 2012-03-23 LAB — URINE CULTURE
Colony Count: NO GROWTH
Culture: NO GROWTH

## 2012-03-23 LAB — PRO B NATRIURETIC PEPTIDE: Pro B Natriuretic peptide (BNP): 9874 pg/mL — ABNORMAL HIGH (ref 0–450)

## 2012-03-23 LAB — PROTIME-INR
INR: 8.48 (ref 0.00–1.49)
Prothrombin Time: 71.2 seconds — ABNORMAL HIGH (ref 11.6–15.2)

## 2012-03-23 LAB — PROCALCITONIN: Procalcitonin: 21.35 ng/mL

## 2012-03-23 MED ORDER — LEVOFLOXACIN IN D5W 500 MG/100ML IV SOLN
500.0000 mg | INTRAVENOUS | Status: DC
  Administered 2012-03-23: 500 mg via INTRAVENOUS
  Filled 2012-03-23 (×2): qty 100

## 2012-03-23 MED ORDER — ZOLPIDEM TARTRATE 5 MG PO TABS
5.0000 mg | ORAL_TABLET | Freq: Every evening | ORAL | Status: DC | PRN
  Administered 2012-03-23: 5 mg via ORAL
  Filled 2012-03-23: qty 1

## 2012-03-23 MED ORDER — ALBUTEROL SULFATE (5 MG/ML) 0.5% IN NEBU
2.5000 mg | INHALATION_SOLUTION | Freq: Four times a day (QID) | RESPIRATORY_TRACT | Status: DC | PRN
  Filled 2012-03-23: qty 0.5

## 2012-03-23 MED ORDER — IPRATROPIUM BROMIDE 0.02 % IN SOLN
0.5000 mg | RESPIRATORY_TRACT | Status: DC
  Administered 2012-03-23 – 2012-03-24 (×4): 0.5 mg via RESPIRATORY_TRACT
  Filled 2012-03-23 (×5): qty 2.5

## 2012-03-23 MED ORDER — VITAMIN K1 10 MG/ML IJ SOLN
5.0000 mg | Freq: Once | INTRAVENOUS | Status: AC
  Administered 2012-03-23: 5 mg via INTRAVENOUS
  Filled 2012-03-23: qty 0.5

## 2012-03-23 MED ORDER — LEVOFLOXACIN IN D5W 750 MG/150ML IV SOLN
750.0000 mg | INTRAVENOUS | Status: DC
Start: 2012-03-23 — End: 2012-03-23
  Filled 2012-03-23: qty 150

## 2012-03-23 NOTE — Progress Notes (Signed)
ANTICOAGULATION CONSULT NOTE - Follow Up Consult  Pharmacy Consult for coumadin, levaquin, and aztreonam Indication: Mech AVR/MVR; suspected UTI, PNA  Allergies  Allergen Reactions  . Niacin     REACTION: itching  . Penicillins     Patient Measurements: Height: 5\' 6"  (167.6 cm) Weight: 232 lb 5.8 oz (105.4 kg) IBW/kg (Calculated) : 59.3  Heparin Dosing Weight:   Vital Signs: Temp: 98 F (36.7 C) (06/29 0805) Temp src: Oral (06/29 0805) BP: 137/64 mmHg (06/29 0754) Pulse Rate: 69  (06/29 0754)  Labs:  Basename 03/23/12 0605 03/22/12 1527 03/22/12 0607 03/21/12 2253 03/21/12 2245 03/21/12 1119  HGB -- -- 9.8* -- 10.5* --  HCT -- -- 29.5* -- 32.0* 30.2*  PLT -- -- 212 -- 216 222  APTT -- -- -- -- -- 79*  LABPROT 71.2* -- 48.5* -- -- 40.7*  INR 8.48* -- 5.19* -- -- 4.15*  HEPARINUNFRC -- -- -- -- -- --  CREATININE -- -- 2.56* -- 2.33* 2.22*  CKTOTAL -- 564* 640* 713* -- --  CKMB -- 2.4 1.9 2.1 -- --  TROPONINI -- 0.34* 0.58* 0.77* -- --    Estimated Creatinine Clearance: 22.2 ml/min (by C-G formula based on Cr of 2.56).   Assessment: Patient is a 78 y.o F on coumadin PTA for mechanical AVR/MVR.  INR continues to trend up with supratherapeutic level of 8.48 today.  No bleeding noted.  Hemoglobin was 9.8 yesterday, no new cbc today.  She's currently on levaquin day #3 and aztreonam day #2 for suspected UTI/PNA.  UCX on 6/27 now back negative and BCXs are NGTD.   No new BMP today.    Goal of Therapy:  INR 2-3   Plan:  1) hold coumadin today 2) no change for aztreonam or levaquin  Dorothee Napierkowski P 03/23/2012,8:18 AM

## 2012-03-23 NOTE — Progress Notes (Signed)
Name: Marzee Keady MRN: [id number] DOB: 12/30/1931    LOS: 2  Referring Provider:  [facility] Reason for Referral:  Acute respiratory failure  PULMONARY / CRITICAL CARE MEDICINE    HPI:  78 yo with COPD and ischemic cardiomyopathy who presented to [facility] with progressive dyspnea for 2 days.  She had some nonproductive cough but no chest pain. She was not compliant with her Lasix for several days prior to admission because of frequency and nocturia.  Her CXR demon started pulmonary congestion and  Pro BNP was elevated.  She was febrile.  She was treated with Lasix and placed on BiPAP. She was transferred to [facility] for farther management.  Past Medical History  Diagnosis Date  . DJD (degenerative joint disease) of lumbar spine   . Chronic kidney disease, stage I   . Esophageal reflux   . Other and unspecified hyperlipidemia   . Other primary cardiomyopathies   . Obesity, unspecified   . Unspecified hypothyroidism   . Type I (juvenile type) diabetes mellitus without mention of complication, not stated as uncontrolled   . Essential hypertension, benign   . 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 Pain in joint, pelvic region and thigh   . Insomnia, unspecified   . Iron deficiency 10/04/2011  . Anemia   . Cancer   . CHF (congestive heart failure)   . COPD (chronic obstructive pulmonary disease)   . Heart murmur   . ICD (implantable cardiac defibrillator) in place    Allergies  Allergen Reactions  . Niacin     REACTION: itching  . Penicillins      Events Since Admission: 6/27  Admitted for acute respiratory failure requiring BiPAP  Current Status: Much improved, off bipap, diuresed 2 L  Vital Signs: Temp:  [97.4 F (36.3 C)-98.7 F (37.1 C)] 98 F (36.7 C) (06/29 0805) Pulse Rate:  [69-87] 69  (06/29 0754) Resp:  [13-37] 19  (06/29 0754) BP: (100-137)/(49-71) 137/64 mmHg (06/29 0754) SpO2:  [96 %-100 %] 98 % (06/29 0754) Weight:  [105.4 kg (232 lb 5.8  oz)] 105.4 kg (232 lb 5.8 oz) (06/29 0500)  Physical Examination: General:  Mild distress, synchronous with BiPAP Neuro:  Somnolent, but arpusable HEENT:  PERRL,  Neck:  Could not assess JVD Cardiovascular:  Regular, tachycardic Lungs:  Diminished air entry bilaterally,  Rales rt base Abdomen:  Soft Musculoskeletal:  Trace   Principal Problem:  *Acute respiratory failure Active Problems:  DIABETES MELLITUS, TYPE II  Non-ischemic cardiomyopathy  Warfarin-induced coagulopathy  H/O aortic valve replacement  Cardiorenal syndrome with renal failure  CAP (community acquired pneumonia)  Acute exacerbation of CHF (congestive heart failure)  COPD exacerbation  Elevated troponin  Encephalopathy acute  S/P AVR (aortic valve replacement)  S/P MVR (mitral valve replacement)  AICD (automatic cardioverter/defibrillator) present  CKD (chronic kidney disease), stage II  ASSESSMENT AND PLAN  PULMONARY  Lab 03/21/12 1950  PHART 7.415*  PCO2ART 40.5  PO2ART 77.5*  HCO3 25.5*  O2SAT 95.3     CXR:  6/27 >>> Cardiac enlargement of pulmonary vascular congestion and infiltration, atelectasis, or edema in the right lung base.  Probable small right pleural effusion. No significant progression since the previous study.  ETT:  NA  A:  Acute hypoxemic respiratory failure.  Possible COPD exacerbation.  Possible pneumonia (CAP). P:   Repeat CXR today Albuterol / Atrovent D/c Pulmicort dc Solu-Medrol - no bspasm ABx /Cx per ID section  CARDIOVASCULAR  Lab 03/22/12  1527 03/22/12 0607 03/21/12 2253 03/21/12 2247 03/21/12 1119  TROPONINI 0.34* 0.58* 0.77* -- 0.43*  LATICACIDVEN -- -- -- 0.9 --  PROBNP -- -- [id number]* -- 34990.0*   ECG:  Paced rhythm Lines: NA  A: Non-ischemic cardiomyopathy (EF < 25% 2012).  Possible CHF (acute systolic) exacerbation.  Demand ischemia vs ACS.  History of mitral / aortic valve replacement. P:  Pos trops ASA, Metoprolol, Vytorin, Bidil  Holding  Heparin as coagulopathic Holding Losartan / Digoxin as acute renal failure TTE Lasix  RENAL  Lab 03/22/12 0607 03/21/12 2245 03/21/12 1119  NA 137 -- 133*  K 4.3 -- 4.6  CL 97 -- 96  CO2 25 -- 22  BUN 58* -- 48*  CREATININE 2.56* 2.33* 2.22*  CALCIUM 9.5 -- 10.1  MG -- -- --  PHOS -- -- --   Intake/Output      06/28 0701 - 06/29 0700 06/29 0701 - 06/30 0700   P.O. 940    IV Piggyback 108    Total Intake(mL/kg) 1048 (9.9)    Urine (mL/kg/hr) 1650 (0.7)    Total Output 1650    Net -602          Foley:  6/28 >>>  A:  Acute on chronic renal failure P:   Trend BMP   GASTROINTESTINAL  Lab 03/22/12 0607 03/21/12 1119  AST 65* 74*  ALT 21 20  ALKPHOS 42 45  BILITOT 0.9 0.9  PROT 7.7 8.5*  ALBUMIN 3.1* 3.6   A:  No active issues. P:  Advance po  HEMATOLOGIC  Lab 03/23/12 0605 03/22/12 0607 03/21/12 2245 03/21/12 1119  HGB -- 9.8* 10.5* 9.7*  HCT -- 29.5* 32.0* 30.2*  PLT -- 212 216 222  INR 8.48* 5.19* -- 4.15*  APTT -- -- -- 79*   A:  Stable anemia.  Coumadin-induced coagulopathy.  No overt hemorrhage. P:  Dc Coumadin , abx related rise in INR, had nose bleed 6/29 - 1 dose of vit K (valve) Trend CBC Ferrous sulfate / folate  INFECTIOUS  Lab 03/22/12 0607 03/21/12 2245 03/21/12 2244 03/21/12 1119  WBC 11.3* 10.4 -- 11.9*  PROCALCITON -- -- 22.68 --   Cultures: 6/27  Blood >>> 6/27  Urine >>>ng Antibiotics: Levaquin 6/27 >>> Aztreonam 6/28 >>>  A:  Suspected pneumonia.  PCN allergy. P:   On Aztreonam/ levaquin Urine Strep neg / Legionella Ag Simplify abx if pct lower  ENDOCRINE  Lab 03/23/12 0804 03/22/12 2136 03/22/12 1708 03/22/12 1201 03/22/12 0759  GLUCAP 169* 208* 179* 164* 134*   A:  IDDM. Hypothyroidism. P:   SSI/CBG Hold Glipizide as NPO Synthroid  NEUROLOGIC  A:  Acute encephalopathy, multifactorial. P:   Hold Gabapentin, Norco, Restoril   BEST PRACTICE / DISPOSITION Level of Care:  Can transfer to tele Primary  Service:  [facility] Consult[facility]:  Cardiology, PCCM Code Status:  Full Diet:  NPO DVT Px:  Not indicated (coagulopathic) GI Px:  Protonix Skin Integrity:  Intact  Social / Family:  Not available    Rigoberto Noel., M.D. [facility] [place] Pager: [phone]  03/23/2012, 9:31 AM

## 2012-03-23 NOTE — Progress Notes (Signed)
[facility] DAILY PROGRESS NOTE  NAME:  [name] [name]   MRN: [id number] DOB:  29-Aug-1932   ADMIT DATE: 03/21/2012   Patient Description   78 y.o. female with PMH below: presented with progressive dyspnea x 4-5  days with cough.  Noted Lasix non-adherence due to frequent urination/nocturia.    Past Medical History  Diagnosis Date  . DJD (degenerative joint disease) of lumbar spine   . Chronic kidney disease, stage I   . Esophageal reflux   . Other and unspecified hyperlipidemia   . Other primary cardiomyopathies   . Obesity, unspecified   . Unspecified hypothyroidism   . Type I (juvenile type) diabetes mellitus without mention of complication, not stated as uncontrolled   . Essential hypertension, benign   . 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 Pain in joint, pelvic region and thigh   . Insomnia, unspecified   . Iron deficiency 10/04/2011  . Anemia   . Cancer   . CHF (congestive heart failure)   . COPD (chronic obstructive pulmonary disease)   . Heart murmur   . ICD (implantable cardiac defibrillator) in place    Length of Stay:  LOS: 2 days    Subjective:   Today [name] [name] says her breathing has improved and coughing less.  Abdomen hurts from coughing. Starting to get an appetite back, but still not eating well.  Very "tired" & weak.  Objective:  Temp:  [97.4 F (36.3 C)-98.6 F (37 C)] 98.1 F (36.7 C) (06/29 1137) Pulse Rate:  [58-83] 74  (06/29 1100) Resp:  [13-37] 25  (06/29 1100) BP: (108-137)/(49-71) 137/64 mmHg (06/29 0754) SpO2:  [95 %-100 %] 95 % (06/29 1100) Weight:  [105.4 kg (232 lb 5.8 oz)] 105.4 kg (232 lb 5.8 oz) (06/29 0500) Weight change: -4.824 kg (-10 lb 10.2 oz) Physical Exam: General appearance: alert, cooperative, appears stated age, no distress and pleasant, but appears weak/tired.  Exopthalmus R>L Neck: no adenopathy, no carotid bruit, supple, symmetrical, trachea midline, thyroid  not enlarged, symmetric, no tenderness/mass/nodules and minimal JVD Lungs: clear to auscultation bilaterally, normal percussion bilaterally and with the exception of RLL intermittent rhonchi. Heart: mostly RRR (paced with intermittent native beats), Metalic [id number] S2, soft Aortic SEM murmut ~2/6. no S3/4. Abdomen: soft, ND, mildly tender - due to couging.  No palpable HSM. NABS. Extremities: edema trace Bilateral pitting edema Neurologic: Grossly normal answers?s appropriately.  more energetic than yesterday.  Intake/Output from previous day: 06/28 0701 - 06/29 0700 In: 1048 [P.O.:940; IV Piggyback:108] Out: 1650 [Urine:1650]  Intake/Output Summary (Last 24 hours) at 03/23/12 1404 Last data filed at 03/23/12 1053  Gross per 24 hour  Intake    844 ml  Output   1250 ml  Net   -406 ml    Results for orders placed during the hospital encounter of 03/21/12 (from the past 24 hour(s))  CARDIAC PANEL(CRET KIN+CKTOT+MB+TROPI)     Status: Abnormal   Collection Time   03/22/12  3:27 PM      Component Value Range   Total CK 564 (*) 7 - 177 U/L   CK, MB 2.4  0.3 - 4.0 ng/mL   Troponin I 0.34 (*) <0.30 ng/mL   Relative Index 0.4  0.0 - 2.5  GLUCOSE, CAPILLARY     Status: Abnormal   Collection Time   03/22/12  5:08 PM      Component Value Range   Glucose-Capillary 179 (*) 70 - 99 mg/dL  Comment 1 Documented in Chart     Comment 2 Notify RN    GLUCOSE, CAPILLARY     Status: Abnormal   Collection Time   03/22/12  9:36 PM      Component Value Range   Glucose-Capillary 208 (*) 70 - 99 mg/dL  PROTIME-INR     Status: Abnormal   Collection Time   03/23/12  6:05 AM      Component Value Range   Prothrombin Time 71.2 (*) 11.6 - 15.2 seconds   INR 8.48 (*) 0.00 - 1.49  GLUCOSE, CAPILLARY     Status: Abnormal   Collection Time   03/23/12  8:04 AM      Component Value Range   Glucose-Capillary 169 (*) 70 - 99 mg/dL   Comment 1 Notify RN    CBC     Status: Abnormal   Collection Time   03/23/12  10:00 AM      Component Value Range   WBC 12.0 (*) 4.0 - 10.5 K/uL   RBC 3.17 (*) 3.87 - 5.11 MIL/uL   Hemoglobin 9.6 (*) 12.0 - 15.0 g/dL   HCT 28.8 (*) 36.0 - 46.0 %   MCV 90.9  78.0 - 100.0 fL   MCH 30.3  26.0 - 34.0 pg   MCHC 33.3  30.0 - 36.0 g/dL   RDW 14.5  11.5 - 15.5 %   Platelets 237  150 - 400 K/uL  BASIC METABOLIC PANEL     Status: Abnormal   Collection Time   03/23/12 10:00 AM      Component Value Range   Sodium 136  135 - 145 mEq/L   Potassium 4.9  3.5 - 5.1 mEq/L   Chloride 97  96 - 112 mEq/L   CO2 23  19 - 32 mEq/L   Glucose, Bld 215 (*) 70 - 99 mg/dL   BUN 83 (*) 6 - 23 mg/dL   Creatinine, Ser 3.01 (*) 0.50 - 1.10 mg/dL   Calcium 9.6  8.4 - 10.5 mg/dL   GFR calc non Af Amer 14 (*) >90 mL/min   GFR calc Af Amer 16 (*) >90 mL/min  PROCALCITONIN     Status: Normal   Collection Time   03/23/12 10:00 AM      Component Value Range   Procalcitonin 21.35    PRO B NATRIURETIC PEPTIDE     Status: Abnormal   Collection Time   03/23/12 10:00 AM      Component Value Range   Pro B Natriuretic peptide (BNP) 9874.0 (*) 0 - 450 pg/mL  GLUCOSE, CAPILLARY     Status: Abnormal   Collection Time   03/23/12 11:38 AM      Component Value Range   Glucose-Capillary 214 (*) 70 - 99 mg/dL   Comment 1 Notify RN      Imaging:  CXR -no significant change from yesterday.  Less suggestive of significant pulmonary edema.  Echo: Reviewed 03/22/12.  Dilated concentric CM with EF~20-25%; Elevated LVEDP/LAP, valves appear to be functioning appropriately.  Elevated Pulm Pressures.  MAR Reviewed  Assessment/Plan:  Principal Problem:  *Acute respiratory failure Active Problems:   CAP (community acquired pneumonia)   Acute exacerbation of CHF (congestive heart  failure)Non-ischemic cardiomyopathy   CKD (chronic kidney disease), stage II   Cardiorenal syndrome with renal failure   HYPERTENSION - with Cardiac Abnormalities   S/P AVR (aortic valve replacement)   S/P MVR (mitral  valve replacement)   DIABETES MELLITUS, TYPE II   AICD (  automatic cardioverter/defibrillator) present   Warfarin-induced coagulopathy   COPD exacerbation   Elevated troponin   Encephalopathy acute  Respiratory failure appears to be multifactorial - partly due to PNA and also due to Acute on Chronic Combined Systolic/Diastolic HF (although the systolic component is not new). Elevated Troponin likely commensurate with volume overload from CHF with endocardial ischemia from increased wall stress and not likely true ACS NSTEMI. -- therefore agree with not using IV Heparin, especially in the setting of elevated INR. (was given Vit K)  Likely PNA - ABx & Nebs per PCCM.  WBC up - ?due to initial steroids. AonC Combined CHF - on stable dose of BB, no need to adjust, On "BiDil" as opposed to ACE-I/ARB due to ARF. Cardiorenal -- creatinine is worse today with diuresis.  Will back off on diuretic for today, but will anticipate increasing back tomorrow.  If continues to worsen, will consider Dobutamine vs. Milrinone (Milronone may be better due to elevated PAP) for improving cardiac output to assist with diuresis.  Hgb with a slight drop since admission, but relatively stable  Coagulopathy - ?combination of home warfarin in setting of possible congestive hepatopathy as well as Abx (Quinolone).  Will definitely need PT/OT in the next few days.  PPI for GI prophylaxis.  Time Spent Directly with Patient:  25  minutes   Ruchi Stoney W, M.D., M.S. [facility] [address] [place], [place]  [address]  [phone]  03/23/2012 2:04 PM

## 2012-03-24 DIAGNOSIS — J189 Pneumonia, unspecified organism: Secondary | ICD-10-CM | POA: Diagnosis present

## 2012-03-24 DIAGNOSIS — Z954 Presence of other heart-valve replacement: Secondary | ICD-10-CM

## 2012-03-24 LAB — COMPREHENSIVE METABOLIC PANEL
ALT: 20 U/L (ref 0–35)
AST: 42 U/L — ABNORMAL HIGH (ref 0–37)
Albumin: 2.6 g/dL — ABNORMAL LOW (ref 3.5–5.2)
Alkaline Phosphatase: 42 U/L (ref 39–117)
BUN: 99 mg/dL — ABNORMAL HIGH (ref 6–23)
CO2: 28 mEq/L (ref 19–32)
Calcium: 9.6 mg/dL (ref 8.4–10.5)
Chloride: 98 mEq/L (ref 96–112)
Creatinine, Ser: 2.96 mg/dL — ABNORMAL HIGH (ref 0.50–1.10)
GFR calc Af Amer: 16 mL/min — ABNORMAL LOW (ref >90)
GFR calc non Af Amer: 14 mL/min — ABNORMAL LOW (ref >90)
Glucose, Bld: 121 mg/dL — ABNORMAL HIGH (ref 70–99)
Potassium: 5.1 mEq/L (ref 3.5–5.1)
Sodium: 137 mEq/L (ref 135–145)
Total Bilirubin: 0.4 mg/dL (ref 0.3–1.2)
Total Protein: 7.2 g/dL (ref 6.0–8.3)

## 2012-03-24 LAB — CBC
HCT: 29.4 % — ABNORMAL LOW (ref 36.0–46.0)
Hemoglobin: 9.5 g/dL — ABNORMAL LOW (ref 12.0–15.0)
MCH: 29.6 pg (ref 26.0–34.0)
MCHC: 32.3 g/dL (ref 30.0–36.0)
MCV: 91.6 fL (ref 78.0–100.0)
Platelets: 262 10*3/uL (ref 150–400)
RBC: 3.21 MIL/uL — ABNORMAL LOW (ref 3.87–5.11)
RDW: 14.5 % (ref 11.5–15.5)
WBC: 11.3 10*3/uL — ABNORMAL HIGH (ref 4.0–10.5)

## 2012-03-24 LAB — GLUCOSE, CAPILLARY
Glucose-Capillary: 121 mg/dL — ABNORMAL HIGH (ref 70–99)
Glucose-Capillary: 150 mg/dL — ABNORMAL HIGH (ref 70–99)
Glucose-Capillary: 134 mg/dL — ABNORMAL HIGH (ref 70–99)
Glucose-Capillary: 171 mg/dL — ABNORMAL HIGH (ref 70–99)

## 2012-03-24 LAB — PROTIME-INR
INR: 1.48 (ref 0.00–1.49)
Prothrombin Time: 18.2 seconds — ABNORMAL HIGH (ref 11.6–15.2)

## 2012-03-24 LAB — PRO B NATRIURETIC PEPTIDE: Pro B Natriuretic peptide (BNP): 6320 pg/mL — ABNORMAL HIGH (ref 0–450)

## 2012-03-24 LAB — HEPARIN LEVEL (UNFRACTIONATED): Heparin Unfractionated: 0.39 IU/mL (ref 0.30–0.70)

## 2012-03-24 MED ORDER — HEPARIN BOLUS VIA INFUSION
4000.0000 [IU] | Freq: Once | INTRAVENOUS | Status: AC
  Administered 2012-03-24: 4000 [IU] via INTRAVENOUS
  Filled 2012-03-24: qty 4000

## 2012-03-24 MED ORDER — TEMAZEPAM 15 MG PO CAPS
30.0000 mg | ORAL_CAPSULE | Freq: Every evening | ORAL | Status: DC | PRN
  Administered 2012-03-24 – 2012-04-01 (×4): 30 mg via ORAL
  Filled 2012-03-24 (×4): qty 2

## 2012-03-24 MED ORDER — IPRATROPIUM BROMIDE 0.02 % IN SOLN
0.5000 mg | Freq: Four times a day (QID) | RESPIRATORY_TRACT | Status: DC | PRN
Start: 2012-03-24 — End: 2012-03-24

## 2012-03-24 MED ORDER — IPRATROPIUM BROMIDE 0.02 % IN SOLN
0.5000 mg | Freq: Four times a day (QID) | RESPIRATORY_TRACT | Status: DC | PRN
  Administered 2012-03-24: 0.5 mg via RESPIRATORY_TRACT
  Filled 2012-03-24: qty 2.5

## 2012-03-24 MED ORDER — HEPARIN (PORCINE) IN NACL 100-0.45 UNIT/ML-% IJ SOLN
1000.0000 [IU]/h | INTRAMUSCULAR | Status: DC
  Administered 2012-03-24 – 2012-03-26 (×3): 1000 [IU]/h via INTRAVENOUS
  Filled 2012-03-24 (×6): qty 250

## 2012-03-24 MED ORDER — FUROSEMIDE 10 MG/ML IJ SOLN
40.0000 mg | Freq: Two times a day (BID) | INTRAMUSCULAR | Status: DC
  Administered 2012-03-24 – 2012-03-25 (×2): 40 mg via INTRAVENOUS
  Filled 2012-03-24 (×4): qty 4

## 2012-03-24 MED ORDER — WARFARIN SODIUM 2.5 MG PO TABS
2.5000 mg | ORAL_TABLET | Freq: Once | ORAL | Status: AC
  Administered 2012-03-24: 2.5 mg via ORAL
  Filled 2012-03-24: qty 1

## 2012-03-24 MED ORDER — ALBUTEROL SULFATE (5 MG/ML) 0.5% IN NEBU
2.5000 mg | INHALATION_SOLUTION | Freq: Four times a day (QID) | RESPIRATORY_TRACT | Status: DC | PRN
  Administered 2012-03-24: 2.5 mg via RESPIRATORY_TRACT
  Filled 2012-03-24: qty 0.5

## 2012-03-24 NOTE — Progress Notes (Addendum)
[facility] DAILY PROGRESS NOTE  NAME:  [name] [name]   MRN: [id number] DOB:  [birth year]   ADMIT DATE: 03/21/2012   Patient Description   78 y.o. female with PMH below: presented with progressive dyspnea x 4-5  days with cough.  Noted Lasix non-adherence due to frequent urination/nocturia.    Past Medical History  Diagnosis Date  . DJD (degenerative joint disease) of lumbar spine   . Chronic kidney disease, stage I   . Esophageal reflux   . Other and unspecified hyperlipidemia   . Other primary cardiomyopathies   . Obesity, unspecified   . Unspecified hypothyroidism   . Type I (juvenile type) diabetes mellitus without mention of complication, not stated as uncontrolled   . Essential hypertension, benign   . 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 Pain in joint, pelvic region and thigh   . Insomnia, unspecified   . Iron deficiency 10/04/2011  . Anemia   . Cancer   . CHF (congestive heart failure)   . COPD (chronic obstructive pulmonary disease)   . Heart murmur   . ICD (implantable cardiac defibrillator) in place    Length of Stay:  LOS: 3 days    Subjective:   Today [name] [name] says looks and feels better.  She is sitting up and has eaten some breakfast.   Has not slept well & has a headache, but feels considerably better than yesterday.    Objective:  Temp:  [97.5 F (36.4 C)-98.1 F (36.7 C)] 97.6 F (36.4 C) (06/30 0758) Pulse Rate:  [58-75] 75  (06/30 0415) Resp:  [21-33] 23  (06/30 0415) BP: (117-129)/(55-67) 128/62 mmHg (06/30 0415) SpO2:  [95 %-99 %] 98 % (06/30 0427) Weight:  [102.5 kg (225 lb 15.5 oz)] 102.5 kg (225 lb 15.5 oz) (06/30 0415) Weight change: -2.9 kg (-6 lb 6.3 oz) Physical Exam: General appearance: alert, cooperative, appears stated age, no distress and pleasant, more alert & vocal today.  Exopthalmus R>L Neck: no adenopathy, no carotid bruit, supple, symmetrical, trachea midline,  thyroid not enlarged, symmetric, no tenderness/mass/nodules and minimal JVD Lungs: clear to auscultation and percussion bilaterally with the exception of RLL rhonchi. Heart: mostly RRR (paced with intermittent native beats), Metalic [id number] S2, soft Aortic SEM murmur ~2/6. no S3/4. Abdomen: soft, ND, mildly tender - due to couging.  No palpable HSM. NABS. Extremities: edema trace Bilateral pitting edema Neurologic: Grossly normal answers?s appropriately.  more energetic than yesterday.  Intake/Output from previous day: 06/29 0701 - 06/30 0700 In: 1182 [P.O.:870; IV Piggyback:312] Out: [id number] [Urine:1725]  Intake/Output Summary (Last 24 hours) at 03/24/12 0912 Last data filed at 03/24/12 0554  Gross per 24 hour  Intake    932 ml  Output   1325 ml  Net   -393 ml    Results for orders placed during the hospital encounter of 03/21/12 (from the past 24 hour(s))  CBC     Status: Abnormal   Collection Time   03/23/12 10:00 AM      Component Value Range   WBC 12.0 (*) 4.0 - 10.5 K/uL   RBC 3.17 (*) 3.87 - 5.11 MIL/uL   Hemoglobin 9.6 (*) 12.0 - 15.0 g/dL   HCT 28.8 (*) 36.0 - 46.0 %   MCV 90.9  78.0 - 100.0 fL   MCH 30.3  26.0 - 34.0 pg   MCHC 33.3  30.0 - 36.0 g/dL   RDW 14.5  11.5 - 15.5 %  Platelets 237  150 - 400 K/uL  BASIC METABOLIC PANEL     Status: Abnormal   Collection Time   03/23/12 10:00 AM      Component Value Range   Sodium 136  135 - 145 mEq/L   Potassium 4.9  3.5 - 5.1 mEq/L   Chloride 97  96 - 112 mEq/L   CO2 23  19 - 32 mEq/L   Glucose, Bld 215 (*) 70 - 99 mg/dL   BUN 83 (*) 6 - 23 mg/dL   Creatinine, Ser 3.01 (*) 0.50 - 1.10 mg/dL   Calcium 9.6  8.4 - 10.5 mg/dL   GFR calc non Af Amer 14 (*) >90 mL/min   GFR calc Af Amer 16 (*) >90 mL/min  PROCALCITONIN     Status: Normal   Collection Time   03/23/12 10:00 AM      Component Value Range   Procalcitonin 21.35    PRO B NATRIURETIC PEPTIDE     Status: Abnormal   Collection Time   03/23/12 10:00 AM      Component  Value Range   Pro B Natriuretic peptide (BNP) 9874.0 (*) 0 - 450 pg/mL  GLUCOSE, CAPILLARY     Status: Abnormal   Collection Time   03/23/12 11:38 AM      Component Value Range   Glucose-Capillary 214 (*) 70 - 99 mg/dL   Comment 1 Notify RN    GLUCOSE, CAPILLARY     Status: Abnormal   Collection Time   03/23/12  2:44 PM      Component Value Range   Glucose-Capillary 178 (*) 70 - 99 mg/dL   Comment 1 Notify RN    GLUCOSE, CAPILLARY     Status: Abnormal   Collection Time   03/23/12  4:34 PM      Component Value Range   Glucose-Capillary 167 (*) 70 - 99 mg/dL  GLUCOSE, CAPILLARY     Status: Abnormal   Collection Time   03/23/12  9:37 PM      Component Value Range   Glucose-Capillary 178 (*) 70 - 99 mg/dL  COMPREHENSIVE METABOLIC PANEL     Status: Abnormal   Collection Time   03/24/12  5:30 AM      Component Value Range   Sodium 137  135 - 145 mEq/L   Potassium 5.1  3.5 - 5.1 mEq/L   Chloride 98  96 - 112 mEq/L   CO2 28  19 - 32 mEq/L   Glucose, Bld 121 (*) 70 - 99 mg/dL   BUN 99 (*) 6 - 23 mg/dL   Creatinine, Ser 2.96 (*) 0.50 - 1.10 mg/dL   Calcium 9.6  8.4 - 10.5 mg/dL   Total Protein 7.2  6.0 - 8.3 g/dL   Albumin 2.6 (*) 3.5 - 5.2 g/dL   AST 42 (*) 0 - 37 U/L   ALT 20  0 - 35 U/L   Alkaline Phosphatase 42  39 - 117 U/L   Total Bilirubin 0.4  0.3 - 1.2 mg/dL   GFR calc non Af Amer 14 (*) >90 mL/min   GFR calc Af Amer 16 (*) >90 mL/min  CBC     Status: Abnormal   Collection Time   03/24/12  5:30 AM      Component Value Range   WBC 11.3 (*) 4.0 - 10.5 K/uL   RBC 3.21 (*) 3.87 - 5.11 MIL/uL   Hemoglobin 9.5 (*) 12.0 - 15.0 g/dL   HCT 29.4 (*) 36.0 -  46.0 %   MCV 91.6  78.0 - 100.0 fL   MCH 29.6  26.0 - 34.0 pg   MCHC 32.3  30.0 - 36.0 g/dL   RDW 14.5  11.5 - 15.5 %   Platelets 262  150 - 400 K/uL  PROTIME-INR     Status: Abnormal   Collection Time   03/24/12  5:30 AM      Component Value Range   Prothrombin Time 18.2 (*) 11.6 - 15.2 seconds   INR 1.48  0.00 - 1.49    PRO B NATRIURETIC PEPTIDE     Status: Abnormal   Collection Time   03/24/12  5:30 AM      Component Value Range   Pro B Natriuretic peptide (BNP) 6320.0 (*) 0 - 450 pg/mL  GLUCOSE, CAPILLARY     Status: Abnormal   Collection Time   03/24/12  7:58 AM      Component Value Range   Glucose-Capillary 121 (*) 70 - 99 mg/dL   Comment 1 Notify RN      Imaging:  CXR -no significant change from yesterday.  Less suggestive of significant pulmonary edema.  Echo: Reviewed 03/22/12.  Dilated concentric CM with EF~20-25%; Elevated LVEDP/LAP, valves appear to be functioning appropriately.  Elevated Pulm Pressures.  MAR Reviewed  Assessment/Plan:  Principal Problem:  *Acute respiratory failure Active Problems:   CAP (community acquired pneumonia)   Acute exacerbation of CHF (congestive heart  failure)Non-ischemic cardiomyopathy   CKD (chronic kidney disease), stage II   Cardiorenal syndrome with renal failure   HYPERTENSION - with Cardiac Abnormalities   S/P AVR (aortic valve replacement)   S/P MVR (mitral valve replacement)   DIABETES MELLITUS, TYPE II   AICD (automatic cardioverter/defibrillator) present   Warfarin-induced coagulopathy   COPD exacerbation   Elevated troponin   Encephalopathy acute  Respiratory failure appears to be multifactorial - partly due to RLL PNA and also due to Acute on Chronic Combined Systolic/Diastolic HF (although the systolic component is not new). Elevated Troponin likely commensurate with volume overload from CHF with endocardial ischemia from increased wall stress and not likely true ACS NSTEMI. -- therefore agree with not using IV Heparin, especially in the setting of elevated INR. (was given Vit K INR now 1.24 - need to restart warfarin with heparin bridge.)   Likely RLL PNA - ABx & Nebs per PCCM.  WBC up - ?due to initial steroids.  Agree with IS per RT.  AonC Combined CHF - on stable dose of BB, no need to adjust, On "BiDil" as opposed to  ACE-I/ARB due to ARF.  BNP down to ~6300 -- continues to diurese slowly, but surely.  Cardiorenal -- creatinine stable overnight after worsening with diuresis.    For now, will continue with BID IV lasix.  If continues to worsen, will consider Milrinone for improving cardiac output to assist with diuresis.   Hgb with a slight drop since admission, but relatively stable   Mechanical MV/AoV -  INR is now subtherapeutic after vitamin K given- will need to bridge with Heparin until INR recovers for his mechanical MV & AoV. Less likely to have hemoptysis post ABx & diuresis.  Will definitely need PT/OT in the next few days.  PPI for GI prophylaxis.  Time Spent Directly with Patient:  25  minutes   [name] W, M.D., M.S. [facility] [address]. [place], [place]  [address]  [phone]  03/24/2012 9:12 AM

## 2012-03-24 NOTE — Progress Notes (Addendum)
ANTICOAGULATION CONSULT NOTE - Follow Up Consult  Pharmacy Consult for coumadin Indication: Mech AVR/MVR  Allergies  Allergen Reactions  . Niacin     REACTION: itching  . Penicillins     Patient Measurements: Height: 5\' 6"  (167.6 cm) Weight: 225 lb 15.5 oz (102.5 kg) IBW/kg (Calculated) : 59.3    Vital Signs: Temp: 97.6 F (36.4 C) (06/30 0758) Temp src: Oral (06/30 0758) BP: 128/62 mmHg (06/30 0415) Pulse Rate: 75  (06/30 0415)  Labs:  Basename 03/24/12 0530 03/23/12 1000 03/23/12 0605 03/22/12 1527 03/22/12 0607 03/21/12 2253 03/21/12 1119  HGB 9.5* 9.6* -- -- -- -- --  HCT 29.4* 28.8* -- -- 29.5* -- --  PLT 262 237 -- -- 212 -- --  APTT -- -- -- -- -- -- 79*  LABPROT 18.2* -- 71.2* -- 48.5* -- --  INR 1.48 -- 8.48* -- 5.19* -- --  HEPARINUNFRC -- -- -- -- -- -- --  CREATININE 2.96* 3.01* -- -- 2.56* -- --  CKTOTAL -- -- -- 564* 640* 713* --  CKMB -- -- -- 2.4 1.9 2.1 --  TROPONINI -- -- -- 0.34* 0.58* 0.77* --    Estimated Creatinine Clearance: 18.9 ml/min (by C-G formula based on Cr of 2.96).   Assessment: Patient is a 78 y.o F on coumadin for mech AVR/MVR.  S/p vit K 5mg  IV x1 on 6/29 with INR decreased from 8.48 to 1.48 today.  No bleeding noted.  Goal of Therapy:  INR 2.5-3.5    Plan:  1) Will give a low dose coumadin 2.5mg  x1 today 2) MD--> With INR subtherapeutic after reversal with K, do you want to bridge with heparin? 3) consider reducing aspirin to 81mg  (if appropriate) since she is on coumadin  Adden:  To start heparin for bridging per Dr. Ellyn Hack.   1) heparin 4000 units IV bolus for 1 2) heparin drip at 1000 units/hr 3) check 8 hour heparin level  Shamell Suarez P 03/24/2012,9:09 AM

## 2012-03-24 NOTE — Progress Notes (Signed)
Name: [name] [name] MRN: [id number] DOB: 1932-04-27    LOS: 3  Referring Provider:  [facility] Reason for Referral:  Acute respiratory failure  PULMONARY / CRITICAL CARE MEDICINE    HPI:  78 yo with COPD on noct O2  , ischemic cardiomyopathy, mech AoV/ MV who presented to [facility] ED with progressive dyspnea for 2 days.  She had some nonproductive cough but no chest pain. She was not compliant with her Lasix for several days prior to admission because of frequency and nocturia.  Her CXR demon started pulmonary congestion and  Pro BNP was elevated.  She was febrile.  She was treated with Lasix and placed on BiPAP. She was transferred to [facility] for farther management.  Allergies  Allergen Reactions  . Niacin     REACTION: itching  . Penicillins      Events Since Admission: 6/27  Admitted for acute respiratory failure requiring BiPAP  Current Status: Much improved with diuresed 2 L No epistaxis, hemoptysis  Vital Signs: Temp:  [97.5 F (36.4 C)-98.1 F (36.7 C)] 97.6 F (36.4 C) (06/30 0758) Pulse Rate:  [67-79] 79  (06/30 0900) Resp:  [21-33] 28  (06/30 0900) BP: (117-129)/(55-67) 128/62 mmHg (06/30 0415) SpO2:  [90 %-99 %] 90 % (06/30 0900) Weight:  [102.5 kg (225 lb 15.5 oz)] 102.5 kg (225 lb 15.5 oz) (06/30 0415)  Physical Examination: General:  No distress, oob to chair, on RA Neuro:  Alert, interactive HEENT:  PERRL, [place] Neck:  Could not assess JVD Cardiovascular:  Regular, tachycardic Lungs:  Diminished air entry bilaterally,  Rales rt base Abdomen:  Soft Musculoskeletal:  Trace   Principal Problem:  *Acute respiratory failure Active Problems:  DIABETES MELLITUS, TYPE II  HYPERTENSION - with Cardiac Abnormalities  Non-ischemic cardiomyopathy  Warfarin-induced coagulopathy  H/O aortic valve replacement  Cardiorenal syndrome with renal failure  CAP (community acquired pneumonia)  Acute exacerbation of CHF (congestive heart failure)  COPD exacerbation  Elevated troponin  Encephalopathy acute  S/P AVR (aortic valve replacement)  S/P MVR (mitral valve replacement)  AICD (automatic cardioverter/defibrillator) present  CKD (chronic kidney disease), stage II  ASSESSMENT AND PLAN  PULMONARY  Lab 03/21/12 1950  PHART 7.415*  PCO2ART 40.5  PO2ART 77.5*  HCO3 25.5*  O2SAT 95.3     CXR:  6/30 >>>mild interstitial pulmonary edema with superimposed areas of atelectasis and/or consolidation in the lung bases bilaterally. In particular, in the right lower lobe   A:  Acute hypoxemic respiratory failure.  Possible COPD exacerbation.  Possible pneumonia (CAP). P:   Albuterol / Atrovent D/c Pulmicort dc Solu-Medrol - no bspasm ABx /Cx per ID section  CARDIOVASCULAR  Lab 03/24/12 0530 03/23/12 1000 03/22/12 1527 03/22/12 0607 03/21/12 2253 03/21/12 2247 03/21/12 1119  TROPONINI -- -- 0.34* 0.58* 0.77* -- 0.43*  LATICACIDVEN -- -- -- -- -- 0.9 --  PROBNP 6320.0* 9874.0* -- -- [id number]* -- 34990.0*   ECG:  Paced rhythm Lines: NA  A: Non-ischemic cardiomyopathy (EF < 25% 2012).  Possible CHF (acute systolic) exacerbation.  Demand ischemia vs ACS.  History of mitral / aortic valve replacement. Echo: 03/22/12. Dilated concentric CM with EF~20-25%; Elevated LVEDP/LAP, valves appear to be functioning appropriately. Elevated Pulm Pressures  P:  Pos trops ASA, Metoprolol, Vytorin, Bidil  Heparin IV for mech valves Holding Losartan / Digoxin as acute renal failure Lasix , agree with inotropes - milrinone, d/w dr Elby Beck  RENAL  Lab 03/24/12 0530 03/23/12 1000 03/22/12 0607 03/21/12 2245 03/21/12 1119  NA 137  136 137 -- 133*  K 5.1 4.9 -- -- --  CL 98 97 97 -- 96  CO2 28 23 25  -- 22  BUN 99* 83* 58* -- 48*  CREATININE 2.96* 3.01* 2.56* 2.33* 2.22*  CALCIUM 9.6 9.6 9.5 -- 10.1  MG -- -- -- -- --  PHOS -- -- -- -- --   Intake/Output      06/29 0701 - 06/30 0700 06/30 0701 - 07/01 0700   P.O. 870    IV Piggyback 312    Total Intake(mL/kg) 1182 (11.5)     Urine (mL/kg/hr) 1725 (0.7)    Total Output 1725    Net -543          Foley:  6/28 >>>  A:  Acute on chronic renal failure, rising cr with lasix P:  Lasix + milrinone     HEMATOLOGIC  Lab 03/24/12 0530 03/23/12 1000 03/23/12 0605 03/22/12 0607 03/21/12 2245 03/21/12 1119  HGB 9.5* 9.6* -- 9.8* 10.5* 9.7*  HCT 29.4* 28.8* -- 29.5* 32.0* 30.2*  PLT 262 237 -- 212 216 222  INR 1.48 -- 8.48* 5.19* -- 4.15*  APTT -- -- -- -- -- 79*   A:  Stable anemia.  Coumadin-induced coagulopathy.  No overt hemorrhage. P:  Dc Coumadin , abx related rise in INR, had nose bleed 6/29 - 1 dose of vit K with drop in INR frmo 8 to 1.4 Agree with heparin bridge for valve Ferrous sulfate / folate  INFECTIOUS  Lab 03/24/12 0530 03/23/12 1000 03/22/12 0607 03/21/12 2245 03/21/12 2244 03/21/12 1119  WBC 11.3* 12.0* 11.3* 10.4 -- 11.9*  PROCALCITON -- 21.35 -- -- 22.68 --   Cultures: 6/27  Blood >>>ng 6/27  Urine >>>ng Antibiotics: Levaquin 6/27 >>> Aztreonam 6/28 >>>  A:  Suspected pneumonia.  PCN allergy. P:   On Aztreonam/ levaquin - for 5-7 ds , use pct to guide therapy Urine Strep neg    ENDOCRINE  Lab 03/24/12 0758 03/23/12 2137 03/23/12 1634 03/23/12 1444 03/23/12 1138  GLUCAP 121* 178* 167* 178* 214*   A:  IDDM. Hypothyroidism. P:   SSI/CBG Hold Glipizide  Synthroid  NEUROLOGIC  A:  Acute encephalopathy, multifactorial -resolved P:   Hold Gabapentin, Norco, Restoril   BEST PRACTICE / DISPOSITION Level of Care:  Can transfer to tele Primary Service:  [place] Consultants:  Cardiology, PCCM Code Status:  Full Diet:  Heart healthy DVT Px:  Not indicated (coagulopathic) GI Px:  Protonix Skin Integrity:  Intact  Social / Family:  Not available    Rigoberto Noel., M.D. Pulmonary and [place] Pager: [phone]  03/24/2012, 11:05 AM

## 2012-03-24 NOTE — Progress Notes (Signed)
ANTICOAGULATION CONSULT NOTE - Follow Up Consult  Pharmacy Consult for coumadin Indication: Mech AVR/MVR  Allergies  Allergen Reactions  . Niacin     REACTION: itching  . Penicillins     Patient Measurements: Height: 5\' 6"  (167.6 cm) Weight: 225 lb 15.5 oz (102.5 kg) IBW/kg (Calculated) : 59.3    Vital Signs: Temp: 97.7 F (36.5 C) (06/30 1604) Temp src: Oral (06/30 1604) Pulse Rate: 66  (06/30 1100)  Labs:  Basename 03/24/12 1730 03/24/12 0530 03/23/12 1000 03/23/12 0605 03/22/12 1527 03/22/12 0607 03/21/12 2253  HGB -- 9.5* 9.6* -- -- -- --  HCT -- 29.4* 28.8* -- -- 29.5* --  PLT -- 262 237 -- -- 212 --  APTT -- -- -- -- -- -- --  LABPROT -- 18.2* -- 71.2* -- 48.5* --  INR -- 1.48 -- 8.48* -- 5.19* --  HEPARINUNFRC 0.39 -- -- -- -- -- --  CREATININE -- 2.96* 3.01* -- -- 2.56* --  CKTOTAL -- -- -- -- 564* 640* 713*  CKMB -- -- -- -- 2.4 1.9 2.1  TROPONINI -- -- -- -- 0.34* 0.58* 0.77*    Estimated Creatinine Clearance: 18.9 ml/min (by C-G formula based on Cr of 2.96).   Assessment: 78 y.o. F on heparin bridge to therapeutic INR for hx mechanical AVR/MVR with a therapeutic heparin level this evening (HL 0.39, goal of 0.3-0.7). No s/sx of bleeding noted per nurse report.   Goal of Therapy:  Anti-Xa level of 0.3-0.7 units/hr   Plan:  1. Continue heparin at current drip rate of 1000 units/hr (10 ml/hr) 2. Will continue to monitor for any signs/symptoms of bleeding and will follow up with heparin level in the a.m to confirm  Alycia Rossetti, PharmD, BCPS Clinical Pharmacist Pager: [phone] 03/24/2012 6:45 PM

## 2012-03-24 NOTE — Progress Notes (Signed)
Notified Md per pt's request for sleep medication. VS stable will continue to monitor. [name] [name]

## 2012-03-25 LAB — GLUCOSE, CAPILLARY
Glucose-Capillary: 109 mg/dL — ABNORMAL HIGH (ref 70–99)
Glucose-Capillary: 121 mg/dL — ABNORMAL HIGH (ref 70–99)
Glucose-Capillary: 168 mg/dL — ABNORMAL HIGH (ref 70–99)
Glucose-Capillary: 117 mg/dL — ABNORMAL HIGH (ref 70–99)

## 2012-03-25 LAB — CBC
HCT: 31 % — ABNORMAL LOW (ref 36.0–46.0)
Hemoglobin: 10.2 g/dL — ABNORMAL LOW (ref 12.0–15.0)
MCH: 30 pg (ref 26.0–34.0)
MCHC: 32.9 g/dL (ref 30.0–36.0)
MCV: 91.2 fL (ref 78.0–100.0)
Platelets: 305 10*3/uL (ref 150–400)
RBC: 3.4 MIL/uL — ABNORMAL LOW (ref 3.87–5.11)
RDW: 14.5 % (ref 11.5–15.5)
WBC: 10.9 10*3/uL — ABNORMAL HIGH (ref 4.0–10.5)

## 2012-03-25 LAB — BASIC METABOLIC PANEL
BUN: 99 mg/dL — ABNORMAL HIGH (ref 6–23)
CO2: 29 mEq/L (ref 19–32)
Calcium: 9.9 mg/dL (ref 8.4–10.5)
Chloride: 96 mEq/L (ref 96–112)
Creatinine, Ser: 2.34 mg/dL — ABNORMAL HIGH (ref 0.50–1.10)
GFR calc Af Amer: 22 mL/min — ABNORMAL LOW (ref >90)
GFR calc non Af Amer: 19 mL/min — ABNORMAL LOW (ref >90)
Glucose, Bld: 126 mg/dL — ABNORMAL HIGH (ref 70–99)
Potassium: 4.4 mEq/L (ref 3.5–5.1)
Sodium: 138 mEq/L (ref 135–145)

## 2012-03-25 LAB — LEGIONELLA ANTIGEN, URINE: Legionella Antigen, Urine: NEGATIVE

## 2012-03-25 LAB — PROTIME-INR
INR: 1.51 — ABNORMAL HIGH (ref 0.00–1.49)
Prothrombin Time: 18.5 seconds — ABNORMAL HIGH (ref 11.6–15.2)

## 2012-03-25 LAB — HEPARIN LEVEL (UNFRACTIONATED): Heparin Unfractionated: 0.48 IU/mL (ref 0.30–0.70)

## 2012-03-25 MED ORDER — WARFARIN SODIUM 7.5 MG PO TABS
7.5000 mg | ORAL_TABLET | Freq: Once | ORAL | Status: AC
  Administered 2012-03-25: 7.5 mg via ORAL
  Filled 2012-03-25 (×2): qty 1

## 2012-03-25 MED ORDER — FUROSEMIDE 40 MG PO TABS
40.0000 mg | ORAL_TABLET | Freq: Two times a day (BID) | ORAL | Status: DC
  Administered 2012-03-25 – 2012-03-30 (×10): 40 mg via ORAL
  Filled 2012-03-25 (×12): qty 1

## 2012-03-25 MED ORDER — LEVOFLOXACIN 500 MG PO TABS
500.0000 mg | ORAL_TABLET | ORAL | Status: AC
  Administered 2012-03-25 – 2012-03-27 (×2): 500 mg via ORAL
  Filled 2012-03-25 (×2): qty 1

## 2012-03-25 NOTE — Progress Notes (Signed)
Pharmacist Heart Failure Core Measure Documentation  Assessment: [name] [name] has an EF documented as 20% on echo  Rationale: Heart failure patients with left ventricular systolic dysfunction (LVSD) and an EF < 40% should be prescribed an angiotensin converting enzyme inhibitor (ACEI) or angiotensin receptor blocker (ARB) at discharge unless a contraindication is documented in the medical record.  This patient is not currently on an ACEI or ARB for HF.  This note is being placed in the record in order to provide documentation that a contraindication to the use of these agents is present for this encounter.  ACE Inhibitor or Angiotensin Receptor Blocker is contraindicated (specify all that apply)  []   ACEI allergy AND ARB allergy []   Angioedema []   Moderate or severe aortic stenosis []   Hyperkalemia []   Hypotension []   Renal artery stenosis [x]   Worsening renal function, preexisting renal disease or dysfunction   Georgina Peer 03/25/2012 1:15 PM

## 2012-03-25 NOTE — Progress Notes (Signed)
ANTICOAGULATION/Antibiotics CONSULT NOTE - Follow Up Consult  Pharmacy Consult for heparin/coumadin Indication: [place] for levaquin/aztreonam Indication: Suspected UTI/PNA  Patient Measurements: Height: 5\' 6"  (167.6 cm) Weight: 230 lb 6.1 oz (104.5 kg) IBW/kg (Calculated) : 59.3   Vital Signs: Temp: 97.3 F (36.3 C) (07/01 0744) Temp src: Oral (07/01 0744) BP: 150/59 mmHg (07/01 0927) Pulse Rate: 84  (07/01 0927)  Labs:  Basename 03/25/12 0329 03/24/12 1730 03/24/12 0530 03/23/12 1000 03/23/12 0605 03/22/12 1527  HGB 10.2* -- 9.5* -- -- --  HCT 31.0* -- 29.4* 28.8* -- --  PLT 305 -- 262 237 -- --  APTT -- -- -- -- -- --  LABPROT 18.5* -- 18.2* -- 71.2* --  INR 1.51* -- 1.48 -- 8.48* --  HEPARINUNFRC 0.48 0.39 -- -- -- --  CREATININE -- -- 2.96* 3.01* -- --  CKTOTAL -- -- -- -- -- 564*  CKMB -- -- -- -- -- 2.4  TROPONINI -- -- -- -- -- 0.34*    Estimated Creatinine Clearance: 19.1 ml/min (by C-G formula based on Cr of 2.96).   Assessment/Plan  1.AVR/MVR-currently on heparin->warfarin bridge until INR at goal. 5mg  IV vitamin k given 6/29. Warfarin resumed last night, INR still subtherapeutic but slowly trending upward. IV heparin gtt at goal. No bleeding complications noted, hgb/plt trending up now.   Goal of Therapy:  Anti-Xa level of 0.3-0.7 units/hr INR goal 2.5-3.5  Plan: Warfarin 7.5mg  tonight Continue heparin at 1000 units/hr Coags in am  2.Possible UTI/PNA - currently on day #4 levaquin and day #3 aztreonam. No fevers noted, WBC stable at 10.9, renal function improving with scr down to 2.3 today. ABX: 06/27: levaquin Rx>> 06/28: aztreonam Rx>> Micro: 06/27: ucx >> negative FINAL 06/27 blood cx x2 >> pending  Plan: Continue levaquin 500mg  IV q48 hours Continue aztreonam 1g IV q 8 hours Follow final culture results, consider monotherapy with levaquin if patient improving  Erin Hearing PharmD, BCPS Clinical  Pharmacist Pager: [phone] 03/25/2012 10:46 AM

## 2012-03-25 NOTE — Progress Notes (Signed)
Pt. Seen and examined. Agree with the NP/PA-C note as written.  She has diuresed well .Marland Kitchen BNP dropping precipitously. Net 4L negative since admit.  I would switch to 40 mg po lasix BID today. Await today's BMET.  Creatinine, I suspect, has reached plateau.  PNA improving with antibiotics. On heparin for subtherapeutic INR. ?why patient got vitamin K.  She was having scant hemotypsis, however, there is concern for provoked valve thrombus with Vit K INR reversal. Coumadin has been restarted. Not on ACE-I/ARB/aldactone due to renal failure. Will certainly need post-discharge rehabilitation.   Pixie Casino, MD, [facility] Attending Cardiologist [facility] [place]

## 2012-03-25 NOTE — Progress Notes (Signed)
[place]   Agencies that are Medicare-Certified and are affiliated with [facility] [place]  Telephone Number Address  [place] has ownership interest in this company; however, you are under no obligation to use this agency. [phone] or  [place], [place] [address]   Agencies that are Medicare-Certified and are not affiliated with [facility] [place] Telephone Number Address  [facility] [phone] Fax [phone] [address] [place], [place]  [address]  [facility] [phone] or [phone] Fax [phone] [id number] [place], [place] [address]  [facility] [phone] Fax [phone] [place], [place] [address]  [place] [phone] Fax [phone] [address] [place], [place]  [address]  [facility] [phone] Fax [phone] [address] [place], [place] [address]  [facility] [place], [place] [address]  [facility] [phone]  [address] [place], [place] [address]  [facility] [phone] or [phone] Fax [phone] [place] [address] [place], [place]  [address]  [facility] [phone] Fax [phone] [place], [place]  [address]  [facility]  [phone] Fax [phone] [address] [place], [place] [address]

## 2012-03-25 NOTE — Progress Notes (Signed)
[facility] Patient Description  78 y.o. female with PMH below: presented with progressive dyspnea x 4-5        days with cough. Noted Lasix non-adherence due to frequent urination/nocturia.   Subjective: +SOB,  Did not sleep very well.  Objective: Vital signs in last 24 hours: Temp:  [97.3 F (36.3 C)-98.3 F (36.8 C)] 97.3 F (36.3 C) (07/01 0744) Pulse Rate:  [42-83] 77  (07/01 0744) Resp:  [24-39] 39  (07/01 0744) BP: (121-144)/(52-76) 144/62 mmHg (07/01 0744) SpO2:  [89 %-97 %] 97 % (07/01 0744) Weight:  [104.5 kg (230 lb 6.1 oz)] 104.5 kg (230 lb 6.1 oz) (07/01 0500) Last BM Date: 03/20/12  Intake/Output from previous day: 06/30 0701 - 07/01 0700 In: 920 [P.O.:600; I.V.:210; IV Piggyback:110] Out: 2175 [Urine:2175] Intake/Output this shift:    Medications Current Facility-Administered Medications  Medication Dose Route Frequency Provider Last Rate Last Dose  . acetaminophen (TYLENOL) tablet 650 mg  650 mg Oral Q6H PRN Rise Patience, MD       Or  . acetaminophen (TYLENOL) suppository 650 mg  650 mg Rectal Q6H PRN Rise Patience, MD   650 mg at 03/21/12 2354  . ipratropium (ATROVENT) nebulizer solution 0.5 mg  0.5 mg Nebulization Q6H PRN Leida Lauth, RRT   0.5 mg at 03/24/12 2142   And  . albuterol (PROVENTIL) (5 MG/ML) 0.5% nebulizer solution 2.5 mg  2.5 mg Nebulization Q6H PRN Leida Lauth, RRT   2.5 mg at 03/24/12 2142  . aspirin tablet 325 mg  325 mg Oral Daily Doree Fudge, MD   325 mg at 03/24/12 1045  . aztreonam (AZACTAM) 1 g in dextrose 5 % 50 mL IVPB  1 g Intravenous Q8H Tomasita Morrow, PHARMD   1 g at 03/25/12 [id number]  . ezetimibe-simvastatin (VYTORIN) 10-20 MG per tablet 1 tablet  1 tablet Oral 3 times weekly Rise Patience, MD   1 tablet at 03/22/12 0906  . fentaNYL (SUBLIMAZE) injection 25 mcg  25 mcg Intravenous Q1H PRN Tanda Rockers, MD      . ferrous sulfate tablet  325 mg  325 mg Oral Q breakfast Rise Patience, MD   325 mg at 03/24/12 1045  . folic acid (FOLVITE) tablet 1 mg  1 mg Oral Daily Rise Patience, MD   1 mg at 03/24/12 1045  . furosemide (LASIX) injection 40 mg  40 mg Intravenous Q12H Leonie Man, MD   40 mg at 03/25/12 0609  . heparin ADULT infusion 100 units/mL (25000 units/250 mL)  1,000 Units/hr Intravenous Continuous Anh P Pham, PHARMD 10 mL/hr at 03/25/12 0624 1,000 Units/hr at 03/25/12 0624  . heparin bolus via infusion 4,000 Units  4,000 Units Intravenous Once Anh P Pham, PHARMD   4,000 Units at 03/24/12 1009  . hydrALAZINE (APRESOLINE) injection 10 mg  10 mg Intravenous Q4H PRN Rise Patience, MD      . insulin aspart (novoLOG) injection 0-9 Units  0-9 Units Subcutaneous TID WC Rise Patience, MD   1 Units at 03/24/12 1817  . isosorbide-hydrALAZINE (BIDIL) 20-37.5 MG per tablet 1 tablet  1 tablet Oral TID Rise Patience, MD   1 tablet at 03/24/12 2134  . levofloxacin (LEVAQUIN) IVPB 500 mg  500 mg Intravenous Q48H Anh P Pham, PHARMD   500 mg at 03/23/12 2307  . metoprolol succinate (TOPROL-XL) 24 hr tablet 100 mg  100 mg Oral Daily Arshad N  Hal Hope, MD   100 mg at 03/24/12 1045  . ondansetron (ZOFRAN) tablet 4 mg  4 mg Oral Q6H PRN Rise Patience, MD       Or  . ondansetron [facility]) injection 4 mg  4 mg Intravenous Q6H PRN Rise Patience, MD      . pantoprazole (PROTONIX) EC tablet 40 mg  40 mg Oral Q1200 Rise Patience, MD   40 mg at 03/24/12 1750  . potassium chloride (K-DUR,KLOR-CON) CR tablet 30 mEq  30 mEq Oral Daily Rise Patience, MD   30 mEq at 03/23/12 0916  . sodium chloride 0.9 % injection 3 mL  3 mL Intravenous Q12H Rise Patience, MD   3 mL at 03/24/12 2135  . temazepam (RESTORIL) capsule 30 mg  30 mg Oral QHS PRN Tarri Fuller, PA   30 mg at 03/24/12 2248  . warfarin (COUMADIN) tablet 2.5 mg  2.5 mg Oral ONCE-1800 Anh P Pham, PHARMD   2.5 mg at 03/24/12 1817  . Warfarin -  Pharmacist Dosing Inpatient   Does not apply q1800 Lorretta Harp, MD      . DISCONTD: albuterol (PROVENTIL) (5 MG/ML) 0.5% nebulizer solution 2.5 mg  2.5 mg Nebulization Q6H PRN Rigoberto Noel, MD      . DISCONTD: furosemide (LASIX) injection 40 mg  40 mg Intravenous Q8H Leonie Man, MD   40 mg at 03/24/12 0554  . DISCONTD: ipratropium (ATROVENT) nebulizer solution 0.5 mg  0.5 mg Nebulization Q2H PRN Doree Fudge, MD      . DISCONTD: ipratropium (ATROVENT) nebulizer solution 0.5 mg  0.5 mg Nebulization Q4H Rigoberto Noel, MD   0.5 mg at 03/24/12 0427  . DISCONTD: ipratropium (ATROVENT) nebulizer solution 0.5 mg  0.5 mg Nebulization Q6H PRN Gena Apple Turner, RRT      . DISCONTD: zolpidem (AMBIEN) tablet 5 mg  5 mg Oral QHS PRN Tarri Fuller, PA   5 mg at 03/23/12 2212    PE: General appearance: alert, cooperative and no distress Lungs: Decreased BS biklaterally, + Rales, Rhonchi Heart: irregularly irregular rhythm, Mech valve clicks crisp, No MM Extremities: Trace LEE Pulses: 2+ and symmetric Neurologic: Grossly normal, Very lethargic  Lab Results:   Basename 03/25/12 0329 03/24/12 0530 03/23/12 1000  WBC 10.9* 11.3* 12.0*  HGB 10.2* 9.5* 9.6*  HCT 31.0* 29.4* 28.8*  PLT 305 262 237   BMET  Basename 03/24/12 0530 03/23/12 1000  NA 137 136  K 5.1 4.9  CL 98 97  CO2 28 23  GLUCOSE 121* 215*  BUN 99* 83*  CREATININE 2.96* 3.01*  CALCIUM 9.6 9.6   PT/INR  Basename 03/25/12 0329 03/24/12 0530 03/23/12 0605  LABPROT 18.5* 18.2* 71.2*  INR 1.51* 1.48 8.48*   PORTABLE CHEST - 1 VIEW  Comparison: Chest x-ray 03/21/2012.  Findings: There are extensive bibasilar opacities which may in part  relate to atelectasis, however, underlying airspace consolidation  is highly suspected, particularly at the right base where there are  numerous air bronchograms. Blunting of the costophrenic sulci  bilaterally suggests small bilateral pleural effusions. Pulmonary  venous  congestion with indistinctness of interstitial markings and  some Kerley B lines in the periphery of the lungs bilaterally  suggesting a background of mild interstitial pulmonary edema.  Heart size is mildly enlarged (unchanged). Status post median  sternotomy for mitral and aortic valve replacement (mechanical  mitral and aortic valves are seen). Atherosclerosis in the  thoracic aorta. A left-sided pacemaker /  AICD with lead tips  projecting over the expected location of the right atrium and right  ventricle.  IMPRESSION:  1. Appearance of the chest is very similar to yesterday's  examination. Findings are again suggestive of a background of mild  interstitial pulmonary edema with superimposed areas of atelectasis  and/or consolidation in the lung bases bilaterally. In particular,  in the right lower lobe findings are highly suspicious for  pneumonia or sequelae of large volume aspiration.  2. Small bilateral pleural effusions are unchanged.  3. Mild cardiomegaly.  4. Atherosclerosis.  5. Postoperative changes and support apparatus, as above.  Assessment/Plan  Principal Problem:  *Acute respiratory failure Active Problems:  DIABETES MELLITUS, TYPE II  HYPERTENSION - with Cardiac Abnormalities  Non-ischemic cardiomyopathy  Warfarin-induced coagulopathy  H/O aortic valve replacement  Cardiorenal syndrome with renal failure  CAP (community acquired pneumonia)  Acute exacerbation of CHF (congestive heart failure)  COPD exacerbation  Elevated troponin  Encephalopathy acute  S/P AVR (aortic valve replacement)  S/P MVR (mitral valve replacement)  AICD (automatic cardioverter/defibrillator) present  CKD (chronic kidney disease), stage II  Pneumonia  Plan:  She appears very weak. Hgb has increased from 9.5 to 10.2.  INR subtherapeutic and with slight increase.   Back on coumadin. BP is stable but with some room for improvement(121/73 - 144/58).  Net fluid -1800 in the last two  days.  BMET ordered for this AM and daily thereafter.  Marked improvement in BNP 29K to 6K.  Consider switching to PO diuretics soon.  WBCs with improvement. She needs PT/OT eval.    LOS: 4 days    [name] [name] 03/25/2012 8:40 AM

## 2012-03-25 NOTE — Progress Notes (Signed)
Name: [name] [name] MRN: [id number] DOB: Dec 03, 1931    LOS: 4  Referring Provider:  [facility] Reason for Referral:  Acute respiratory failure  PULMONARY / CRITICAL CARE MEDICINE    HPI:  78 yo with COPD on noct O2  , ischemic cardiomyopathy, mech AoV/ MV who presented to [facility] ED with progressive dyspnea for 2 days.  She had some nonproductive cough but no chest pain. She was not compliant with her Lasix for several days prior to admission because of frequency and nocturia.  Her CXR demon started pulmonary congestion and  Pro BNP was elevated.  She was febrile.  She was treated with Lasix and placed on BiPAP. She was transferred to [facility] for farther management.   Events Since Admission: 6/27  Admitted for acute respiratory failure requiring BiPAP  Current Status: No distress. No new complaints  Vital Signs: Temp:  [97.3 F (36.3 C)-98.3 F (36.8 C)] 97.3 F (36.3 C) (07/01 1303) Pulse Rate:  [42-84] 74  (07/01 1303) Resp:  [26-39] 34  (07/01 1200) BP: (123-156)/(52-80) 156/80 mmHg (07/01 1303) SpO2:  [89 %-97 %] 96 % (07/01 1303) Weight:  [104.5 kg (230 lb 6.1 oz)] 104.5 kg (230 lb 6.1 oz) (07/01 0500)  Physical Examination: General:  No distress Neuro:  Alert, interactive HEENT:  PERRL, [place] Neck:  Could not assess JVD Cardiovascular:  Regular, tachycardic Lungs:  Few bibasilar rales Abdomen:  Soft Musculoskeletal:  Trace edema  Principal Problem:  *Acute respiratory failure Active Problems:  DIABETES MELLITUS, TYPE II  HYPERTENSION - with Cardiac Abnormalities  Non-ischemic cardiomyopathy  Warfarin-induced coagulopathy  H/O aortic valve replacement  Cardiorenal syndrome with renal failure  CAP (community acquired pneumonia)  Acute exacerbation of CHF (congestive heart failure)  COPD exacerbation  Elevated troponin  Encephalopathy acute  S/P AVR (aortic valve replacement)  S/P MVR (mitral valve replacement)  AICD (automatic cardioverter/defibrillator) present  CKD  (chronic kidney disease), stage II  Pneumonia  ASSESSMENT AND PLAN  Acute respiratory failure due to likely pulmonary edema and possible PNA. Much improved with diuresis. Had isolated fever on admission, none since. Will D/C Aztreonam and complete a course of oral Levofloxacin.   PCCM will sign off. Please call if we can be of further assistance   Merton Border, MD ; [facility] [phone].  After 5:30 PM or weekends, call [phone]

## 2012-03-26 LAB — BASIC METABOLIC PANEL
BUN: 89 mg/dL — ABNORMAL HIGH (ref 6–23)
CO2: 31 mEq/L (ref 19–32)
Calcium: 9.6 mg/dL (ref 8.4–10.5)
Chloride: 101 mEq/L (ref 96–112)
Creatinine, Ser: 1.94 mg/dL — ABNORMAL HIGH (ref 0.50–1.10)
GFR calc Af Amer: 27 mL/min — ABNORMAL LOW (ref >90)
GFR calc non Af Amer: 24 mL/min — ABNORMAL LOW (ref >90)
Glucose, Bld: 111 mg/dL — ABNORMAL HIGH (ref 70–99)
Potassium: 4.6 mEq/L (ref 3.5–5.1)
Sodium: 141 mEq/L (ref 135–145)

## 2012-03-26 LAB — PROTIME-INR
INR: 1.51 — ABNORMAL HIGH (ref 0.00–1.49)
Prothrombin Time: 18.5 seconds — ABNORMAL HIGH (ref 11.6–15.2)

## 2012-03-26 LAB — CBC
HCT: 31.9 % — ABNORMAL LOW (ref 36.0–46.0)
Hemoglobin: 10.2 g/dL — ABNORMAL LOW (ref 12.0–15.0)
MCH: 29.1 pg (ref 26.0–34.0)
MCHC: 32 g/dL (ref 30.0–36.0)
MCV: 91.1 fL (ref 78.0–100.0)
Platelets: 308 10*3/uL (ref 150–400)
RBC: 3.5 MIL/uL — ABNORMAL LOW (ref 3.87–5.11)
RDW: 14.5 % (ref 11.5–15.5)
WBC: 11.5 10*3/uL — ABNORMAL HIGH (ref 4.0–10.5)

## 2012-03-26 LAB — HEPARIN LEVEL (UNFRACTIONATED): Heparin Unfractionated: 0.38 IU/mL (ref 0.30–0.70)

## 2012-03-26 LAB — GLUCOSE, CAPILLARY
Glucose-Capillary: 175 mg/dL — ABNORMAL HIGH (ref 70–99)
Glucose-Capillary: 113 mg/dL — ABNORMAL HIGH (ref 70–99)
Glucose-Capillary: 129 mg/dL — ABNORMAL HIGH (ref 70–99)

## 2012-03-26 MED ORDER — WARFARIN SODIUM 7.5 MG PO TABS
7.5000 mg | ORAL_TABLET | Freq: Once | ORAL | Status: AC
  Administered 2012-03-26: 7.5 mg via ORAL
  Filled 2012-03-26: qty 1

## 2012-03-26 NOTE — Progress Notes (Addendum)
ANTICOAGULATION/Antibiotics CONSULT NOTE - Follow Up Consult  Pharmacy Consult for heparin/coumadin Indication: [place] for levaquin Indication: Suspected UTI/PNA  Patient Measurements: Height: 5\' 6"  (167.6 cm) Weight: 230 lb 6.1 oz (104.5 kg) IBW/kg (Calculated) : 59.3   Vital Signs: Temp: 97.3 F (36.3 C) (07/02 0829) Temp src: Oral (07/02 0829) BP: 126/46 mmHg (07/02 0800) Pulse Rate: 76  (07/02 0829)  Labs:  Basename 03/26/12 0500 03/25/12 0922 03/25/12 0329 03/24/12 1730 03/24/12 0530  HGB 10.2* -- 10.2* -- --  HCT 31.9* -- 31.0* -- 29.4*  PLT 308 -- 305 -- 262  APTT -- -- -- -- --  LABPROT 18.5* -- 18.5* -- 18.2*  INR 1.51* -- 1.51* -- 1.48  HEPARINUNFRC 0.38 -- 0.48 0.39 --  CREATININE 1.94* 2.34* -- -- 2.96*  CKTOTAL -- -- -- -- --  CKMB -- -- -- -- --  TROPONINI -- -- -- -- --    Estimated Creatinine Clearance: 29.2 ml/min (by C-G formula based on Cr of 1.94).   Assessment/Plan  1.AVR/MVR-currently on heparin->warfarin bridge until INR at goal. 5mg  IV vitamin k given 6/29. Warfarin resumed 6/30, INR still subtherapeutic but slowly trending upward. IV heparin gtt at goal. No bleeding complications noted, hgb/plt trending up now.  Goal of Therapy:  Anti-Xa level of 0.3-0.7 units/hr INR goal 2.5-3.5 Monitor platelets by protocol - Yes  Plan: Warfarin 7.5mg  tonight Continue heparin at 1000 units/hr Coags in am  2.Possible UTI/PNA - currently on day #5 levaquin;  Aztreonam d/c'd. No fevers noted, WBC stable at 10.9, renal function improving with scr down to 1.94 today. ABX: 06/27: levaquin Rx>> 06/28: aztreonam Rx>> D/C 7/1. Micro: 06/27: ucx >> negative FINAL 06/27 blood cx x2 >> pending; no growth to date  Plan: Continue levaquin 500mg  po q48 hours Follow final culture results.  [place] Crane, Pharm D 03/26/2012 9:38 AM

## 2012-03-26 NOTE — Progress Notes (Signed)
[facility]  Subjective: Breathing better.  Can not sleep flat.  Objective: Vital signs in last 24 hours: Temp:  [97.2 F (36.2 C)-98.2 F (36.8 C)] 97.5 F (36.4 C) (07/02 0346) Pulse Rate:  [69-84] 69  (07/02 0346) Resp:  [25-34] 28  (07/02 0346) BP: (108-156)/(40-80) 126/46 mmHg (07/02 0800) SpO2:  [95 %-99 %] 99 % (07/02 0346) Last BM Date: 03/20/12  Intake/Output from previous day: 07/01 0701 - 07/02 0700 In: 480 [P.O.:360; I.V.:120] Out: 3150 [Urine:3150] Intake/Output this shift:    Medications Current Facility-Administered Medications  Medication Dose Route Frequency Provider Last Rate Last Dose  . acetaminophen (TYLENOL) tablet 650 mg  650 mg Oral Q6H PRN Rise Patience, MD       Or  . acetaminophen (TYLENOL) suppository 650 mg  650 mg Rectal Q6H PRN Rise Patience, MD   650 mg at 03/21/12 2354  . ipratropium (ATROVENT) nebulizer solution 0.5 mg  0.5 mg Nebulization Q6H PRN Leida Lauth, RRT   0.5 mg at 03/24/12 2142   And  . albuterol (PROVENTIL) (5 MG/ML) 0.5% nebulizer solution 2.5 mg  2.5 mg Nebulization Q6H PRN Leida Lauth, RRT   2.5 mg at 03/24/12 2142  . aspirin tablet 325 mg  325 mg Oral Daily Doree Fudge, MD   325 mg at 03/25/12 0927  . ezetimibe-simvastatin (VYTORIN) 10-20 MG per tablet 1 tablet  1 tablet Oral 3 times weekly Rise Patience, MD   1 tablet at 03/25/12 1145  . fentaNYL (SUBLIMAZE) injection 25 mcg  25 mcg Intravenous Q1H PRN Tanda Rockers, MD      . ferrous sulfate tablet 325 mg  325 mg Oral Q breakfast Rise Patience, MD   325 mg at 03/25/12 0927  . folic acid (FOLVITE) tablet 1 mg  1 mg Oral Daily Rise Patience, MD   1 mg at 03/25/12 [id number]  . furosemide (LASIX) tablet 40 mg  40 mg Oral BID Pixie Casino, MD   40 mg at 03/25/12 1752  . heparin ADULT infusion 100 units/mL (25000 units/250 mL)  1,000 Units/hr Intravenous Continuous Anh P Pham,  PHARMD 10 mL/hr at 03/26/12 0600 1,000 Units/hr at 03/26/12 0600  . hydrALAZINE (APRESOLINE) injection 10 mg  10 mg Intravenous Q4H PRN Rise Patience, MD      . insulin aspart (novoLOG) injection 0-9 Units  0-9 Units Subcutaneous TID WC Rise Patience, MD   2 Units at 03/25/12 1753  . isosorbide-hydrALAZINE (BIDIL) 20-37.5 MG per tablet 1 tablet  1 tablet Oral TID Rise Patience, MD   1 tablet at 03/25/12 2119  . levofloxacin (LEVAQUIN) tablet 500 mg  500 mg Oral Q48H Wilhelmina Mcardle, MD   500 mg at 03/25/12 2119  . metoprolol succinate (TOPROL-XL) 24 hr tablet 100 mg  100 mg Oral Daily Rise Patience, MD   100 mg at 03/25/12 0927  . ondansetron (ZOFRAN) tablet 4 mg  4 mg Oral Q6H PRN Rise Patience, MD       Or  . ondansetron [facility]) injection 4 mg  4 mg Intravenous Q6H PRN Rise Patience, MD      . pantoprazole (PROTONIX) EC tablet 40 mg  40 mg Oral Q1200 Rise Patience, MD   40 mg at 03/25/12 1146  . potassium chloride (K-DUR,KLOR-CON) CR tablet 30 mEq  30 mEq Oral Daily Rise Patience, MD   30 mEq at 03/25/12 0928  .  sodium chloride 0.9 % injection 3 mL  3 mL Intravenous Q12H Rise Patience, MD   3 mL at 03/25/12 2126  . temazepam (RESTORIL) capsule 30 mg  30 mg Oral QHS PRN Tarri Fuller, PA   30 mg at 03/24/12 2248  . warfarin (COUMADIN) tablet 7.5 mg  7.5 mg Oral ONCE-1800 Georgina Peer, PHARMD   7.5 mg at 03/25/12 1752  . Warfarin - Pharmacist Dosing Inpatient   Does not apply q1800 Lorretta Harp, MD      . DISCONTD: aztreonam (AZACTAM) 1 g in dextrose 5 % 50 mL IVPB  1 g Intravenous Q8H Tomasita Morrow, PHARMD   1 g at 03/25/12 [id number]  . DISCONTD: furosemide (LASIX) injection 40 mg  40 mg Intravenous Q12H Leonie Man, MD   40 mg at 03/25/12 [id number]  . DISCONTD: levofloxacin (LEVAQUIN) IVPB 500 mg  500 mg Intravenous Q48H Anh P Pham, PHARMD   500 mg at 03/23/12 2307    PE: General appearance: alert, cooperative and no distress Lungs:  Decreased breath.  Course BS cleared with cough Heart: irregularly irregular rhythm and 2/6 sys MM Abd soft, BS + Extremities: No LEE Pulses: 2+ and symmetric Neurologic: Grossly normal  Lab Results:   Basename 03/26/12 0500 03/25/12 0329 03/24/12 0530  WBC 11.5* 10.9* 11.3*  HGB 10.2* 10.2* 9.5*  HCT 31.9* 31.0* 29.4*  PLT 308 305 262   BMET  Basename 03/26/12 0500 03/25/12 0922 03/24/12 0530  NA 141 138 137  K 4.6 4.4 5.1  CL 101 96 98  CO2 31 29 28   GLUCOSE 111* 126* 121*  BUN 89* 99* 99*  CREATININE 1.94* 2.34* 2.96*  CALCIUM 9.6 9.9 9.6   PT/INR  Basename 03/26/12 0500 03/25/12 0329 03/24/12 0530  LABPROT 18.5* 18.5* 18.2*  INR 1.51* 1.51* 1.48   BNP (last 3 results)  Basename 03/24/12 0530 03/23/12 1000 03/21/12 2253  PROBNP 6320.0* 9874.0* [id number]*   Assessment/Plan  Principal Problem:  *Acute respiratory failure Active Problems:  DIABETES MELLITUS, TYPE II  HYPERTENSION - with Cardiac Abnormalities  Non-ischemic cardiomyopathy, EF 20-25%  Warfarin-induced coagulopathy  H/O aortic valve replacement  Cardiorenal syndrome with renal failure  CAP (community acquired pneumonia)  Acute exacerbation of CHF (congestive heart failure)  COPD exacerbation  Elevated troponin  Encephalopathy acute  S/P AVR (aortic valve replacement)  S/P MVR (mitral valve replacement)  AICD (automatic cardioverter/defibrillator) present  CKD (chronic kidney disease), stage II  Pneumonia  Plan: She looks better today.  Another 3.9L out in the last two days.  Switched to PO lasix yesterday, 40mg  BID.  Home does was 40mg  daily.  Now on PO Levofloxacin.  ACE-I/ARB held due to ARI.  SCr improved more today.  subtherapeutic INR on Heparin.  Coumadin.  Home Health RN and PT arranged.  Will arrange close follow-up within one week after DC.  Like here for a couple more days.    LOS: 5 days    HAGER, BRYAN 03/26/2012 8:26 AM     Patient seen and examined. Agree with assessment  and plan. Pt continues to have excellent diuresis. Renal function is slowly improving. INR is subtherapeutic, now on heparin. Occasional unifocal PVC's persist. Will f/u BNP in am. May need to slowly titrate Toprol if ectopy persists.   Troy Sine, MD, [facility] 03/26/2012 8:57 AM

## 2012-03-27 LAB — GLUCOSE, CAPILLARY
Glucose-Capillary: 117 mg/dL — ABNORMAL HIGH (ref 70–99)
Glucose-Capillary: 154 mg/dL — ABNORMAL HIGH (ref 70–99)
Glucose-Capillary: 104 mg/dL — ABNORMAL HIGH (ref 70–99)
Glucose-Capillary: 185 mg/dL — ABNORMAL HIGH (ref 70–99)

## 2012-03-27 LAB — CBC
HCT: 32.2 % — ABNORMAL LOW (ref 36.0–46.0)
Hemoglobin: 10.2 g/dL — ABNORMAL LOW (ref 12.0–15.0)
MCH: 29.6 pg (ref 26.0–34.0)
MCHC: 31.7 g/dL (ref 30.0–36.0)
MCV: 93.3 fL (ref 78.0–100.0)
Platelets: 319 10*3/uL (ref 150–400)
RBC: 3.45 MIL/uL — ABNORMAL LOW (ref 3.87–5.11)
RDW: 14.6 % (ref 11.5–15.5)
WBC: 12.5 10*3/uL — ABNORMAL HIGH (ref 4.0–10.5)

## 2012-03-27 LAB — URINALYSIS, ROUTINE W REFLEX MICROSCOPIC
Bilirubin Urine: NEGATIVE
Glucose, UA: NEGATIVE mg/dL
Hgb urine dipstick: NEGATIVE
Ketones, ur: NEGATIVE mg/dL
Nitrite: NEGATIVE
Protein, ur: NEGATIVE mg/dL
Specific Gravity, Urine: 1.018 (ref 1.005–1.030)
Urobilinogen, UA: 0.2 mg/dL (ref 0.0–1.0)
pH: 5.5 (ref 5.0–8.0)

## 2012-03-27 LAB — PRO B NATRIURETIC PEPTIDE: Pro B Natriuretic peptide (BNP): 1396 pg/mL — ABNORMAL HIGH (ref 0–450)

## 2012-03-27 LAB — BASIC METABOLIC PANEL
BUN: 80 mg/dL — ABNORMAL HIGH (ref 6–23)
CO2: 32 mEq/L (ref 19–32)
Calcium: 9.9 mg/dL (ref 8.4–10.5)
Chloride: 99 mEq/L (ref 96–112)
Creatinine, Ser: 1.88 mg/dL — ABNORMAL HIGH (ref 0.50–1.10)
GFR calc Af Amer: 28 mL/min — ABNORMAL LOW (ref >90)
GFR calc non Af Amer: 24 mL/min — ABNORMAL LOW (ref >90)
Glucose, Bld: 117 mg/dL — ABNORMAL HIGH (ref 70–99)
Potassium: 4.2 mEq/L (ref 3.5–5.1)
Sodium: 140 mEq/L (ref 135–145)

## 2012-03-27 LAB — PROTIME-INR
INR: 1.48 (ref 0.00–1.49)
Prothrombin Time: 18.2 seconds — ABNORMAL HIGH (ref 11.6–15.2)

## 2012-03-27 LAB — HEPARIN LEVEL (UNFRACTIONATED)
Heparin Unfractionated: 0.27 IU/mL — ABNORMAL LOW (ref 0.30–0.70)
Heparin Unfractionated: 0.4 IU/mL (ref 0.30–0.70)

## 2012-03-27 LAB — URINE MICROSCOPIC-ADD ON

## 2012-03-27 MED ORDER — WARFARIN SODIUM 7.5 MG PO TABS
7.5000 mg | ORAL_TABLET | Freq: Once | ORAL | Status: AC
  Administered 2012-03-27: 7.5 mg via ORAL
  Filled 2012-03-27: qty 1

## 2012-03-27 MED ORDER — ENSURE PUDDING PO PUDG
1.0000 | Freq: Three times a day (TID) | ORAL | Status: DC
  Administered 2012-03-27 – 2012-04-02 (×15): 1 via ORAL

## 2012-03-27 MED ORDER — HEPARIN (PORCINE) IN NACL 100-0.45 UNIT/ML-% IJ SOLN
900.0000 [IU]/h | INTRAMUSCULAR | Status: DC
  Administered 2012-03-27 – 2012-03-28 (×3): 1150 [IU]/h via INTRAVENOUS
  Administered 2012-03-30: 1050 [IU]/h via INTRAVENOUS
  Filled 2012-03-27 (×6): qty 250

## 2012-03-27 MED ORDER — SODIUM CHLORIDE 0.9 % IV SOLN
INTRAVENOUS | Status: DC | PRN
  Administered 2012-03-27 – 2012-03-28 (×2): via INTRAVENOUS

## 2012-03-27 NOTE — Progress Notes (Signed)
CARDIAC REHAB PHASE I   PRE:  Rate/Rhythm: paced 67  BP:  Supine:   Sitting: 114/60  Standing:   SaO2: 99 2lnc  MODE:  Ambulation: 50 ft with O2  2lnc   POST:  Rate/Rhythem: Paced   BP:  Supine:   Sitting:108/72  Standing:    SaO2: 97 2lnc  [name] [name]  Ambulated pt up to Bedside commode then  in hallway x 1 assist with much encouragement.  Pt ambulated slowly and had trouble steering walker away from walls.  Reminders to stay within the walker. Pt stooped over the walker and had difficulty standing upright.  Pt was able to stand upright at the end of the walk. Pt denies any shortness of breath but complained of fatigue and lower back pain.   Pt would benefit from PT evaluation for progressive ambulation.  Pt reported that prior to this hospitalization she did not use any assistive devices.  Pt assisted back to bed by her choice, call bell in place.  Pt denies any complaints at this time.

## 2012-03-27 NOTE — Progress Notes (Signed)
Patient was transferred to unit with foley catheter in place. Orders were checked for appropriateness of keeping foley in place. No orders were found. Patient's foley was discontinued. Peri-care was done. Patient has been voiding on her own with 1 person assist to the bedside commode. Patient has tolerated ambulating to beside commode well.

## 2012-03-27 NOTE — Progress Notes (Signed)
ANTICOAGULATION CONSULT NOTE - Follow Up Consult  Pharmacy Consult for Heparin and Coumadin Indication: mechanical AVR/MVR  Allergies  Allergen Reactions  . Niacin     REACTION: itching  . Penicillins     Patient Measurements: Height: 5\' 7"  (170.2 cm) Weight: 225 lb 3.2 oz (102.15 kg) (scale B) IBW/kg (Calculated) : 61.6  Heparin Dosing Weight: 85 kg  Vital Signs: Temp: 97.1 F (36.2 C) (07/03 1400) Temp src: Oral (07/03 1400) BP: 129/75 mmHg (07/03 1651) Pulse Rate: 70  (07/03 1651)  Labs:  Basename 03/27/12 2031 03/27/12 0535 03/26/12 0500 03/25/12 0922 03/25/12 0329  HGB -- 10.2* 10.2* -- --  HCT -- 32.2* 31.9* -- 31.0*  PLT -- 319 308 -- 305  APTT -- -- -- -- --  LABPROT -- 18.2* 18.5* -- 18.5*  INR -- 1.48 1.51* -- 1.51*  HEPARINUNFRC 0.40 0.27* 0.38 -- --  CREATININE -- 1.88* 1.94* 2.34* --  CKTOTAL -- -- -- -- --  CKMB -- -- -- -- --  TROPONINI -- -- -- -- --    Estimated Creatinine Clearance: 30.3 ml/min (by C-G formula based on Cr of 1.88).  Assessment:   Heparin level is now therapeutic on 1150 units/hr.   S/p Vitamin K 5 mg IV on 6/29 (when INR was 8.48). Coumadin resumed with 2.5 mg on 6/30 (when INR down to 1.48). Third dose of Coumadin 7.5 mg given today.  Goal of Therapy:  Heparin level 0.3-0.7 units/ml INR 2.5-3.5 Monitor platelets by anticoagulation protocol: Yes   Plan:    Continue heparin drip at 1150 units/hr.   Next heparin level, PT/INR and CBC in am.  Arty Baumgartner, [place] Pager: [phone] 03/27/2012,9:27 PM

## 2012-03-27 NOTE — Progress Notes (Signed)
ANTICOAGULATION/Antibiotics CONSULT NOTE - Follow Up Consult  Pharmacy Consult for heparin/coumadin Indication: [place] for levaquin Indication: Suspected UTI/PNA  Patient Measurements: Height: 5\' 7"  (170.2 cm) Weight: 225 lb 3.2 oz (102.15 kg) (scale B) IBW/kg (Calculated) : 61.6   Vital Signs: Temp: 97 F (36.1 C) (07/03 1009) Temp src: Oral (07/03 1009) BP: 133/69 mmHg (07/03 1009) Pulse Rate: 71  (07/03 1009)  Labs:  Basename 03/27/12 0535 03/26/12 0500 03/25/12 0922 03/25/12 0329  HGB 10.2* 10.2* -- --  HCT 32.2* 31.9* -- 31.0*  PLT 319 308 -- 305  APTT -- -- -- --  LABPROT 18.2* 18.5* -- 18.5*  INR 1.48 1.51* -- 1.51*  HEPARINUNFRC 0.27* 0.38 -- 0.48  CREATININE 1.88* 1.94* 2.34* --  CKTOTAL -- -- -- --  CKMB -- -- -- --  TROPONINI -- -- -- --    Estimated Creatinine Clearance: 30.3 ml/min (by C-G formula based on Cr of 1.88).   Assessment/Plan  1.AVR/MVR-currently on heparin->warfarin bridge until INR at goal. 5mg  IV vitamin k given 6/29. Warfarin resumed 6/30, INR still subtherapeutic today due to vit k a few days ago. Heparin level slightly subtherapeutic this AM. No bleeding complications noted.  Goal of Therapy:  Anti-Xa level of 0.3-0.7 units/hr INR goal 2.5-3.5 Monitor platelets by protocol - Yes  Plan: Warfarin 7.5mg  tonight Increase heparin to 1150 units/hr Check 8hr level  2.Possible UTI/PNA - currently on day #6 levaquin;  Aztreonam d/c'd. No fevers noted, WBC 12.5, renal function improving with scr down to 1.88 today. ABX: 06/27: levaquin Rx>> 06/28: aztreonam Rx>> D/C 7/1. Micro: 06/27: ucx >> negative FINAL 06/27 blood cx x2 >> pending; no growth to date  Plan: Continue levaquin 500mg  po q48 hours Follow final culture results.

## 2012-03-27 NOTE — Progress Notes (Addendum)
Subjective: Pt complains of SOB, and fatigue, from having to be up to [facility] --foley was d/c'd yesterday.    Objective: Vital signs in last 24 hours: Temp:  [97.2 F (36.2 C)-98.3 F (36.8 C)] 97.2 F (36.2 C) (07/03 0443) Pulse Rate:  [69-79] 77  (07/03 0443) Resp:  [20-32] 20  (07/03 0443) BP: (115-125)/(48-70) 120/66 mmHg (07/03 0443) SpO2:  [90 %-98 %] 90 % (07/03 0443) Weight:  [102.15 kg (225 lb 3.2 oz)-102.9 kg (226 lb 13.7 oz)] 102.15 kg (225 lb 3.2 oz) (07/03 0443) Weight change:  Last BM Date: 03/26/12 Intake/Output from previous day:-1629   Wt 102.1Kg.  Down from 110.2 on admit 07/02 0701 - 07/03 0700 In: 1113 [P.O.:1000; I.V.:113] Out: 2752 [Urine:2752] Intake/Output this shift: Total I/O In: 134.3 [I.V.:134.3] Out: 200 [Urine:200]  PE: General:Alert and oriented, pleasant affect, complains of fatigue having to get up to [facility] frequently. Neck:no JVD while sitting up [id number] RRR with  Click of valves, Lungs:decreased breath sounds no wheezes Abd:+ BS, soft,non tender Ext:no edema to trace.    Lab Results:  Basename 03/27/12 0535 03/26/12 0500  WBC 12.5* 11.5*  HGB 10.2* 10.2*  HCT 32.2* 31.9*  PLT 319 308   BMET  Basename 03/27/12 0535 03/26/12 0500  NA 140 141  K 4.2 4.6  CL 99 101  CO2 32 31  GLUCOSE 117* 111*  BUN 80* 89*  CREATININE 1.88* 1.94*  CALCIUM 9.9 9.6   No results found for this basename: TROPONINI:2,CK,MB:2 in the last 72 hours  Lab Results  Component Value Date   CHOL 120 02/14/2012   HDL 29* 02/14/2012   LDLCALC 57 02/14/2012   TRIG 172* 02/14/2012   CHOLHDL 4.1 02/14/2012   Lab Results  Component Value Date   HGBA1C 6.6* 03/21/2012     Lab Results  Component Value Date   TSH 1.608 02/14/2012    Hepatic Function Panel No results found for this basename: PROT,ALBUMIN,AST,ALT,ALKPHOS,BILITOT,BILIDIR,IBILI in the last 72 hours No results found for this basename: CHOL in the last 72 hours No results found for this basename:  PROTIME in the last 72 hours    EKG: Orders placed during the hospital encounter of 03/21/12  . EKG 12-LEAD  . EKG 12-LEAD  . EKG    Studies/Results: No results found.  Medications: I have reviewed the patient's current medications.    Marland Kitchen aspirin  325 mg Oral Daily  . ezetimibe-simvastatin  1 tablet Oral 3 times weekly  . ferrous sulfate  325 mg Oral Q breakfast  . folic acid  1 mg Oral Daily  . furosemide  40 mg Oral BID  . insulin aspart  0-9 Units Subcutaneous TID WC  . isosorbide-hydrALAZINE  1 tablet Oral TID  . levofloxacin  500 mg Oral Q48H  . metoprolol succinate  100 mg Oral Daily  . pantoprazole  40 mg Oral Q1200  . potassium chloride SA  30 mEq Oral Daily  . sodium chloride  3 mL Intravenous Q12H  . warfarin  7.5 mg Oral ONCE-1800  . Warfarin - Pharmacist Dosing Inpatient   Does not apply q1800   Assessment/Plan: Patient Active Problem List  Diagnosis  . HYPOTHYROIDISM  . DIABETES MELLITUS, TYPE II  . HYPERLIPIDEMIA  . DEHYDRATION  . OBESITY  . HYPERTENSION - with Cardiac Abnormalities  . Non-ischemic cardiomyopathy  . UNSPECIFIED CARDIOVASCULAR DISEASE  . VARICOSE VEINS LOWER EXTREMITIES W/OTH COMPS  . GERD  . CHOLELITHIASIS  . CKD (chronic kidney disease) stage 4, GFR  15-29 ml/min  . ACUTE CYSTITIS  . OTHER SIGN AND SYMPTOM IN BREAST  . HIP PAIN, RIGHT  . DEGENERATIVE JOINT DISEASE, SPINE  . INSOMNIA  . FATIGUE  . NAUSEA  . NECK SPASM  . HEART VALVE REPLACED BY OTHER MEANS  . HEADACHE  . Allergic rhinitis  . Low back pain  . Iron deficiency  . Papilloma of breast  . Acute respiratory failure  . Warfarin-induced coagulopathy  . H/O aortic valve replacement  . Cardiorenal syndrome with renal failure  . CAP (community acquired pneumonia)  . Acute exacerbation of CHF (congestive heart failure)  . COPD exacerbation  . Elevated troponin  . Encephalopathy acute  . S/P AVR (aortic valve replacement)  . S/P MVR (mitral valve replacement)  .  AICD (automatic cardioverter/defibrillator) present  . CKD (chronic kidney disease), stage II  . Pneumonia   PLAN: Pro BNP now 1396 down from 29898 from admit. INR is 1.48 on heparin crossover. Will have cardiac rehab begin ambulation.  LOS: 6 days   INGOLD,LAURA R 03/27/2012, 8:09 AM  CHF continuing to improve on PO Lasix -- only problem is that she is fatigued from persistent urination once foley removed.   She is quite weak -- needs to have rehab/PT to work with her as we are in the preparation for discharge stage.   Cr continuing to improve.  Unfortunately, her rate limiting factor will likely be her stagnant INR after Vit K administration.  On Heparin bridge.    On Stable BB dose with frequent PVCs  -- may need to increase dose to ~125mg .  Bradycardia is not a concern with AICD.  -- Not on ACE-I until Creatinine fully improves.  On TID BiDil Can probably think about backing down to once daily Lasix tomorrow.  RLL PNA on Avelox per PCCM recommendations.  Still a bit weak & not eating robustly.  Will order dietery supplementation.  Leonie Man, M.D., M.S. [facility] [address] [place], [place]  [address]  [phone] Pager # [phone]  03/27/2012 4:54 PM

## 2012-03-28 LAB — BASIC METABOLIC PANEL
BUN: 66 mg/dL — ABNORMAL HIGH (ref 6–23)
CO2: 29 mEq/L (ref 19–32)
Calcium: 10 mg/dL (ref 8.4–10.5)
Chloride: 100 mEq/L (ref 96–112)
Creatinine, Ser: 1.7 mg/dL — ABNORMAL HIGH (ref 0.50–1.10)
GFR calc Af Amer: 32 mL/min — ABNORMAL LOW (ref >90)
GFR calc non Af Amer: 28 mL/min — ABNORMAL LOW (ref >90)
Glucose, Bld: 120 mg/dL — ABNORMAL HIGH (ref 70–99)
Potassium: 4.3 mEq/L (ref 3.5–5.1)
Sodium: 141 mEq/L (ref 135–145)

## 2012-03-28 LAB — CULTURE, BLOOD (ROUTINE X 2)
Culture: NO GROWTH
Culture: NO GROWTH

## 2012-03-28 LAB — PROTIME-INR
INR: 1.8 — ABNORMAL HIGH (ref 0.00–1.49)
Prothrombin Time: 21.2 seconds — ABNORMAL HIGH (ref 11.6–15.2)

## 2012-03-28 LAB — GLUCOSE, CAPILLARY
Glucose-Capillary: 91 mg/dL (ref 70–99)
Glucose-Capillary: 136 mg/dL — ABNORMAL HIGH (ref 70–99)

## 2012-03-28 LAB — CBC
HCT: 33.6 % — ABNORMAL LOW (ref 36.0–46.0)
Hemoglobin: 10.5 g/dL — ABNORMAL LOW (ref 12.0–15.0)
MCH: 29.1 pg (ref 26.0–34.0)
MCHC: 31.3 g/dL (ref 30.0–36.0)
MCV: 93.1 fL (ref 78.0–100.0)
Platelets: 326 10*3/uL (ref 150–400)
RBC: 3.61 MIL/uL — ABNORMAL LOW (ref 3.87–5.11)
RDW: 14.7 % (ref 11.5–15.5)
WBC: 13.1 10*3/uL — ABNORMAL HIGH (ref 4.0–10.5)

## 2012-03-28 LAB — HEPARIN LEVEL (UNFRACTIONATED): Heparin Unfractionated: 0.64 IU/mL (ref 0.30–0.70)

## 2012-03-28 MED ORDER — WARFARIN SODIUM 7.5 MG PO TABS
7.5000 mg | ORAL_TABLET | Freq: Once | ORAL | Status: AC
  Administered 2012-03-28: 7.5 mg via ORAL
  Filled 2012-03-28: qty 1

## 2012-03-28 NOTE — Clinical Social Work Psychosocial (Signed)
     Clinical Social Work Department BRIEF PSYCHOSOCIAL ASSESSMENT 03/28/2012  Patient:  [name] [name],[name] [name]     Account Number:  [id number]     Admit date:  03/21/2012  Clinical Social Worker:  Katrinka Blazing  Date/Time:  03/28/2012 11:21 AM  Referred by:  Physician  Date Referred:  03/28/2012 Referred for  SNF Placement   Other Referral:   Interview type:  Patient Other interview type:    PSYCHOSOCIAL DATA Living Status:  ALONE Admitted from facility:   Level of care:   Primary support name:  [phone] Primary support relationship to patient:  SIBLING Degree of support available:   Unknown.    CURRENT CONCERNS Current Concerns  Post-Acute Placement   Other Concerns:    SOCIAL WORK ASSESSMENT / PLAN Clinical Social Worker recieved referral indicating that pt could benefit from SNF due to medical deconditioning.  CSW reviewed chart and met with pt at bedside.  CSW introduced self, explained role, and provided support.  Pt expressed that she would like to improve her strength and she was "worried" about not being strong enough.  CSW reviewed MD concerns and pt agreeable to SNF.   Pt shared that she has recieved [facility] previously but, "not in [place]". CSW reviewed SNF process and provided opportunity for questions/concenrs to be addressed.  CSW to submit appropriate paperwork for SNF review.   Assessment/plan status:  Information/Referral to [employer] Other assessment/ plan:   Information/referral to community resources:   SNF    PATIENTS/FAMILYS RESPONSE TO PLAN OF CARE: Pt was pleasant and appropriately engaged.  Pt appeared to communicate in an open and honest manner.  Pt thanked CSW for intervention.

## 2012-03-28 NOTE — Evaluation (Signed)
Physical Therapy Evaluation Patient Details Name: [name] [name] MRN: [id number] DOB: 1932-08-27 Today's Date: 03/28/2012 Time: [id number] PT Time Calculation (min): 26 min  PT Assessment / Plan / Recommendation Clinical Impression  Ms. Bartosik is 78 y/o female admitted for ARF. Presents to physical therapy today with decreased activity tolerance and generalized weakness limiting her mobility and independence. Will benefit physical therapy in the acute setting to accelerate her recovery and maximize function for decreased BOC at next venue. Agree with SNF for rehab prior to d/c home.     PT Assessment  Patient needs continued PT services    Follow Up Recommendations  Skilled nursing facility    Barriers to Discharge Decreased caregiver support      Equipment Recommendations  Defer to next venue    Recommendations for Other Services     Frequency Min 3X/week    Precautions / Restrictions Precautions Precautions: Fall   Pertinent Vitals/Pain Sats dropped to 78% with ambulation on 2 L, rebounded within 30 seconds to 92% with cues for pursed lip breathing and seated rest      Mobility  Bed Mobility Bed Mobility: Supine to Sit Supine to Sit: 6: Modified independent (Device/Increase time);HOB elevated (50 degrees) Transfers Transfers: Sit to Stand;Stand to Sit Sit to Stand: 4: Min guard;From bed;With upper extremity assist Stand to Sit: To chair/3-in-1;With armrests;With upper extremity assist Details for Transfer Assistance: cues for safe hand placement and step by step verbal sequencing cues for safe positioning in prep to sit while using RW Ambulation/Gait Ambulation/Gait Assistance: 4: Min guard Ambulation Distance (Feet): 200 Feet Assistive device: Rolling walker Ambulation/Gait Assistance Details: flexed trunk, cues for upright posture and safe technique with RW, easily fatigued, flexes more with fatigue Gait Pattern: Trunk flexed;Decreased step length - right;Decreased  step length - left    Exercises     PT Diagnosis: Difficulty walking;Abnormality of gait;Generalized weakness  PT Problem List: Decreased strength;Decreased activity tolerance;Decreased mobility;Decreased knowledge of use of DME;Cardiopulmonary status limiting activity PT Treatment Interventions: DME instruction;Gait training;Functional mobility training;Therapeutic activities;Therapeutic exercise;Neuromuscular re-education;Patient/family education   PT Goals Acute Rehab PT Goals PT Goal Formulation: With patient Time For Goal Achievement: 04/11/12 Potential to Achieve Goals: Good Pt will go Sit to Stand: with modified independence PT Goal: Sit to Stand - Progress: Goal set today Pt will go Stand to Sit: with modified independence PT Goal: Stand to Sit - Progress: Goal set today Pt will Transfer Bed to Chair/Chair to Bed: with modified independence PT Transfer Goal: Bed to Chair/Chair to Bed - Progress: Goal set today Pt will Ambulate: >150 feet;with modified independence;with least restrictive assistive device PT Goal: Ambulate - Progress: Goal set today Pt will Perform Home Exercise Program: Independently PT Goal: Perform Home Exercise Program - Progress: Goal set today  Visit Information  Last PT Received On: 03/28/12 Assistance Needed: +1    Subjective Data  Subjective: Im just so tired. I really want to just get out of this bed.    Prior Functioning  Home Living Lives With: Other (Comment) (brother (also disabled)) Available Help at Discharge: Family;Available PRN/intermittently (brother's son is staying with him while she is in hospital) Type of Home: House Home Access: Ramped entrance Home Layout: One level Home Adaptive Equipment: Straight cane Prior Function Level of Independence: Independent Driving: No Vocation: On disability Communication Communication: No difficulties    Cognition  Overall Cognitive Status: Appears within functional limits for tasks  assessed/performed Arousal/Alertness: Awake/alert Orientation Level: Appears intact for tasks assessed Behavior During Session: [facility] [place]  for tasks performed    Extremity/Trunk Assessment Right Upper Extremity Assessment RUE ROM/Strength/Tone: WFL for tasks assessed RUE Sensation: WFL - Light Touch;WFL - Proprioception RUE Coordination: WFL - gross/fine motor Left Upper Extremity Assessment LUE ROM/Strength/Tone: WFL for tasks assessed LUE Sensation: WFL - Light Touch;WFL - Proprioception LUE Coordination: WFL - gross/fine motor Right Lower Extremity Assessment RLE ROM/Strength/Tone: Deficits RLE ROM/Strength/Tone Deficits: generalized weakness, fatigues quickly, grossly 4/5 strength RLE Sensation: WFL - Light Touch;WFL - Proprioception RLE Coordination: WFL - gross/fine motor Left Lower Extremity Assessment LLE ROM/Strength/Tone: Deficits LLE ROM/Strength/Tone Deficits: generalized weakness, fatigues quickly, grossly 4/5 strength LLE Sensation: WFL - Light Touch;WFL - Proprioception LLE Coordination: WFL - gross/fine motor   Balance    End of Session PT - End of Session Equipment Utilized During Treatment: Gait belt Activity Tolerance: Patient tolerated treatment well;Patient limited by fatigue Patient left: in chair;with call bell/phone within reach Nurse Communication: Mobility status  GP     [place] 03/28/2012, 12:22 PM

## 2012-03-28 NOTE — Progress Notes (Addendum)
Clinical Social Work Department CLINICAL SOCIAL WORK PLACEMENT NOTE 03/28/2012  Patient:  [name] [name],[name] [name]  Account Number:  [id number] Admit date:  03/21/2012  Clinical Social Worker:  Katrinka Blazing  Date/time:  03/28/2012 11:36 AM  Clinical Social Work is seeking post-discharge placement for this patient at the following level of care:   [place]   (*CSW will update this form in Epic as items are completed)   03/28/2012  Patient/family provided with [place] Department of Clinical Social Work's list of facilities offering this level of care within the geographic area requested by the patient (or if unable, by the patient's family).  03/28/2012  Patient/family informed of their freedom to choose among providers that offer the needed level of care, that participate in Medicare, Medicaid or managed care program needed by the patient, have an available bed and are willing to accept the patient.  03/28/2012  Patient/family informed of [facility]' ownership interest in [facility], as well as of the fact that they are under no obligation to receive care at this facility.  PASARR submitted to EDS on 03/28/2012 PASARR number received from EDS on   FL2 transmitted to all facilities in geographic area requested by pt/family on  03/28/2012 FL2 transmitted to all facilities within larger geographic area on   Patient informed that his/her managed care company has contracts with or will negotiate with  certain facilities, including the following:     Patient/family informed of bed offers received:  03/29/12 Patient chooses bed at [facility]  Physician recommends and patient chooses bed at    Patient to be transferred to [facility] on  04/02/12 Patient to be transferred to facility by Private vehicle  The following physician request were entered in Epic:   Additional Comments:  Patient will be discharged today 04/02/12 by private vehicle.

## 2012-03-28 NOTE — Progress Notes (Signed)
ANTICOAGULATION CONSULT NOTE - Follow Up Consult  Pharmacy Consult for Heparin and Coumadin Indication: mechanical AVR/MVR  Allergies  Allergen Reactions  . Niacin     REACTION: itching  . Penicillins     Patient Measurements: Height: 5\' 7"  (170.2 cm) Weight: 227 lb 1.2 oz (103 kg) (Scale B.) IBW/kg (Calculated) : 61.6  Heparin Dosing Weight: 85 kg  Vital Signs: Temp: 97.3 F (36.3 C) (07/04 0602) Temp src: Oral (07/04 0602) BP: 126/61 mmHg (07/04 0602) Pulse Rate: 75  (07/04 0602)  Labs:  Flo Shanks 03/28/12 0650 03/27/12 2031 03/27/12 0535 03/26/12 0500  HGB 10.5* -- 10.2* --  HCT 33.6* -- 32.2* 31.9*  PLT 326 -- 319 308  APTT -- -- -- --  LABPROT 21.2* -- 18.2* 18.5*  INR 1.80* -- 1.48 1.51*  HEPARINUNFRC 0.64 0.40 0.27* --  CREATININE 1.70* -- 1.88* 1.94*  CKTOTAL -- -- -- --  CKMB -- -- -- --  TROPONINI -- -- -- --    Estimated Creatinine Clearance: 33.7 ml/min (by C-G formula based on Cr of 1.7).  Assessment:   Heparin level is now therapeutic on 1150 units/hr.   S/p Vitamin K 5 mg IV on 6/29 (when INR was 8.48). INR = 1.8 today after 3 doses of 7.5mg . Heparin level is therapeutic. Plan for dc soon.  Goal of Therapy:  Heparin level 0.3-0.7 units/ml INR 2.5-3.5 Monitor platelets by anticoagulation protocol: Yes   Plan:    Continue heparin drip at 1150 units/hr.   Coumadin 7.5mg  PO x1 today

## 2012-03-28 NOTE — Progress Notes (Signed)
[facility]  DAILY PROGRESS NOTE   Subjective:  Heart failure markedly improved, however, she is still weak and will require rehabilitation. Creatinine continues to improve with diuresis, consistent with improving cardiorenal syndrome. INR 1.8 today.  Objective:  Temp:  [97 F (36.1 C)-98.2 F (36.8 C)] 97.3 F (36.3 C) (07/04 0602) Pulse Rate:  [69-75] 75  (07/04 0602) Resp:  [20-22] 20  (07/04 0602) BP: (117-142)/(52-75) 126/61 mmHg (07/04 0602) SpO2:  [95 %-98 %] 97 % (07/04 0602) Weight:  [103 kg (227 lb 1.2 oz)] 103 kg (227 lb 1.2 oz) (07/04 0602) Weight change: 0.1 kg (3.5 oz)  Intake/Output from previous day: 07/03 0701 - 07/04 0700 In: 2071.1 [P.O.:1560; I.V.:511.1] Out: 1940 [Urine:1940]  Intake/Output from this shift:    Medications: Current Facility-Administered Medications  Medication Dose Route Frequency Provider Last Rate Last Dose  . 0.9 %  sodium chloride infusion   Intravenous Continuous PRN Lorretta Harp, MD 10 mL/hr at 03/28/12 0335    . acetaminophen (TYLENOL) tablet 650 mg  650 mg Oral Q6H PRN Rise Patience, MD   650 mg at 03/26/12 2036   Or  . acetaminophen (TYLENOL) suppository 650 mg  650 mg Rectal Q6H PRN Rise Patience, MD   650 mg at 03/21/12 2354  . ipratropium (ATROVENT) nebulizer solution 0.5 mg  0.5 mg Nebulization Q6H PRN Leida Lauth, RRT   0.5 mg at 03/24/12 2142   And  . albuterol (PROVENTIL) (5 MG/ML) 0.5% nebulizer solution 2.5 mg  2.5 mg Nebulization Q6H PRN Leida Lauth, RRT   2.5 mg at 03/24/12 2142  . aspirin tablet 325 mg  325 mg Oral Daily Doree Fudge, MD   325 mg at 03/27/12 1014  . ezetimibe-simvastatin (VYTORIN) 10-20 MG per tablet 1 tablet  1 tablet Oral 3 times weekly Rise Patience, MD   1 tablet at 03/27/12 1236  . feeding supplement (ENSURE) pudding 1 Container  1 Container Oral TID WC Leonie Man, MD   1 Container at 03/28/12 0700  .  fentaNYL (SUBLIMAZE) injection 25 mcg  25 mcg Intravenous Q1H PRN Tanda Rockers, MD      . ferrous sulfate tablet 325 mg  325 mg Oral Q breakfast Rise Patience, MD   325 mg at 03/28/12 0749  . folic acid (FOLVITE) tablet 1 mg  1 mg Oral Daily Rise Patience, MD   1 mg at 03/27/12 1014  . furosemide (LASIX) tablet 40 mg  40 mg Oral BID Pixie Casino, MD   40 mg at 03/28/12 0748  . heparin ADULT infusion 100 units/mL (25000 units/250 mL)  1,150 Units/hr Intravenous Continuous Lorretta Harp, MD 11.5 mL/hr at 03/28/12 0335 1,150 Units/hr at 03/28/12 0335  . hydrALAZINE (APRESOLINE) injection 10 mg  10 mg Intravenous Q4H PRN Rise Patience, MD      . insulin aspart (novoLOG) injection 0-9 Units  0-9 Units Subcutaneous TID WC Rise Patience, MD   2 Units at 03/27/12 1209  . isosorbide-hydrALAZINE (BIDIL) 20-37.5 MG per tablet 1 tablet  1 tablet Oral TID Rise Patience, MD   1 tablet at 03/27/12 2234  . levofloxacin (LEVAQUIN) tablet 500 mg  500 mg Oral Q48H Wilhelmina Mcardle, MD   500 mg at 03/27/12 2234  . metoprolol succinate (TOPROL-XL) 24 hr tablet 100 mg  100 mg Oral Daily Rise Patience, MD   100 mg at 03/27/12 1014  .  ondansetron (ZOFRAN) tablet 4 mg  4 mg Oral Q6H PRN Rise Patience, MD       Or  . ondansetron [facility]) injection 4 mg  4 mg Intravenous Q6H PRN Rise Patience, MD      . pantoprazole (PROTONIX) EC tablet 40 mg  40 mg Oral Q1200 Rise Patience, MD   40 mg at 03/27/12 1209  . potassium chloride (K-DUR,KLOR-CON) CR tablet 30 mEq  30 mEq Oral Daily Rise Patience, MD   30 mEq at 03/27/12 1014  . sodium chloride 0.9 % injection 3 mL  3 mL Intravenous Q12H Rise Patience, MD   3 mL at 03/27/12 2233  . temazepam (RESTORIL) capsule 30 mg  30 mg Oral QHS PRN Tarri Fuller, PA   30 mg at 03/27/12 2234  . warfarin (COUMADIN) tablet 7.5 mg  7.5 mg Oral ONCE-1800 Lorretta Harp, MD   7.5 mg at 03/27/12 1652  . Warfarin - Pharmacist  Dosing Inpatient   Does not apply q1800 Lorretta Harp, MD      . DISCONTD: heparin ADULT infusion 100 units/mL (25000 units/250 mL)  1,000 Units/hr Intravenous Continuous Anh P Pham, PHARMD 10 mL/hr at 03/26/12 1800 1,000 Units/hr at 03/26/12 1800    Physical Exam: General appearance: alert and appears older than stated age Neck: no adenopathy, no carotid bruit, no JVD, supple, symmetrical, trachea midline and thyroid not enlarged, symmetric, no tenderness/mass/nodules Lungs: clear to auscultation bilaterally Heart: regular rate and rhythm, S1, S2 normal, no murmur, click, rub or gallop Abdomen: soft, non-tender; bowel sounds normal; no masses,  no organomegaly Extremities: extremities normal, atraumatic, no cyanosis or edema Pulses: 2+ and symmetric  Lab Results: Results for orders placed during the hospital encounter of 03/21/12 (from the past 48 hour(s))  GLUCOSE, CAPILLARY     Status: Abnormal   Collection Time   03/26/12  9:26 AM      Component Value Range Comment   Glucose-Capillary 175 (*) 70 - 99 mg/dL   GLUCOSE, CAPILLARY     Status: Abnormal   Collection Time   03/26/12 12:18 PM      Component Value Range Comment   Glucose-Capillary 113 (*) 70 - 99 mg/dL   GLUCOSE, CAPILLARY     Status: Abnormal   Collection Time   03/26/12  9:33 PM      Component Value Range Comment   Glucose-Capillary 129 (*) 70 - 99 mg/dL   PROTIME-INR     Status: Abnormal   Collection Time   03/27/12  5:35 AM      Component Value Range Comment   Prothrombin Time 18.2 (*) 11.6 - 15.2 seconds    INR 1.48  0.00 - 1.49   CBC     Status: Abnormal   Collection Time   03/27/12  5:35 AM      Component Value Range Comment   WBC 12.5 (*) 4.0 - 10.5 K/uL    RBC 3.45 (*) 3.87 - 5.11 MIL/uL    Hemoglobin 10.2 (*) 12.0 - 15.0 g/dL    HCT 32.2 (*) 36.0 - 46.0 %    MCV 93.3  78.0 - 100.0 fL    MCH 29.6  26.0 - 34.0 pg    MCHC 31.7  30.0 - 36.0 g/dL    RDW 14.6  11.5 - 15.5 %    Platelets 319  150 - 400 K/uL     HEPARIN LEVEL (UNFRACTIONATED)     Status: Abnormal   Collection  Time   03/27/12  5:35 AM      Component Value Range Comment   Heparin Unfractionated 0.27 (*) 0.30 - 0.70 IU/mL   BASIC METABOLIC PANEL     Status: Abnormal   Collection Time   03/27/12  5:35 AM      Component Value Range Comment   Sodium 140  135 - 145 mEq/L    Potassium 4.2  3.5 - 5.1 mEq/L    Chloride 99  96 - 112 mEq/L    CO2 32  19 - 32 mEq/L    Glucose, Bld 117 (*) 70 - 99 mg/dL    BUN 80 (*) 6 - 23 mg/dL    Creatinine, Ser 1.88 (*) 0.50 - 1.10 mg/dL    Calcium 9.9  8.4 - 10.5 mg/dL    GFR calc non Af Amer 24 (*) >90 mL/min    GFR calc Af Amer 28 (*) >90 mL/min   PRO B NATRIURETIC PEPTIDE     Status: Abnormal   Collection Time   03/27/12  5:35 AM      Component Value Range Comment   Pro B Natriuretic peptide (BNP) 1396.0 (*) 0 - 450 pg/mL   GLUCOSE, CAPILLARY     Status: Abnormal   Collection Time   03/27/12  6:04 AM      Component Value Range Comment   Glucose-Capillary 117 (*) 70 - 99 mg/dL    Comment 1 Documented in Chart      Comment 2 Notify RN     GLUCOSE, CAPILLARY     Status: Abnormal   Collection Time   03/27/12 11:15 AM      Component Value Range Comment   Glucose-Capillary 154 (*) 70 - 99 mg/dL    Comment 1 Documented in Chart      Comment 2 Notify RN     URINALYSIS, ROUTINE W REFLEX MICROSCOPIC     Status: Abnormal   Collection Time   03/27/12 12:01 PM      Component Value Range Comment   Color, Urine YELLOW  YELLOW    APPearance CLEAR  CLEAR    Specific Gravity, Urine 1.018  1.005 - 1.030    pH 5.5  5.0 - 8.0    Glucose, UA NEGATIVE  NEGATIVE mg/dL    Hgb urine dipstick NEGATIVE  NEGATIVE    Bilirubin Urine NEGATIVE  NEGATIVE    Ketones, ur NEGATIVE  NEGATIVE mg/dL    Protein, ur NEGATIVE  NEGATIVE mg/dL    Urobilinogen, UA 0.2  0.0 - 1.0 mg/dL    Nitrite NEGATIVE  NEGATIVE    Leukocytes, UA MODERATE (*) NEGATIVE   URINE MICROSCOPIC-ADD ON     Status: Abnormal   Collection Time   03/27/12  12:01 PM      Component Value Range Comment   WBC, UA 11-20  <3 WBC/hpf    RBC / HPF 0-2  <3 RBC/hpf    Bacteria, UA MANY (*) RARE   GLUCOSE, CAPILLARY     Status: Abnormal   Collection Time   03/27/12  4:31 PM      Component Value Range Comment   Glucose-Capillary 104 (*) 70 - 99 mg/dL    Comment 1 Documented in Chart      Comment 2 Notify RN     HEPARIN LEVEL (UNFRACTIONATED)     Status: Normal   Collection Time   03/27/12  8:31 PM      Component Value Range Comment   Heparin Unfractionated 0.40  0.30 - 0.70 IU/mL   GLUCOSE, CAPILLARY     Status: Abnormal   Collection Time   03/27/12 10:22 PM      Component Value Range Comment   Glucose-Capillary 185 (*) 70 - 99 mg/dL    Comment 1 Notify RN     GLUCOSE, CAPILLARY     Status: Normal   Collection Time   03/28/12  5:51 AM      Component Value Range Comment   Glucose-Capillary 91  70 - 99 mg/dL    Comment 1 Documented in Chart      Comment 2 Notify RN     PROTIME-INR     Status: Abnormal   Collection Time   03/28/12  6:50 AM      Component Value Range Comment   Prothrombin Time 21.2 (*) 11.6 - 15.2 seconds    INR 1.80 (*) 0.00 - 1.49   CBC     Status: Abnormal   Collection Time   03/28/12  6:50 AM      Component Value Range Comment   WBC 13.1 (*) 4.0 - 10.5 K/uL    RBC 3.61 (*) 3.87 - 5.11 MIL/uL    Hemoglobin 10.5 (*) 12.0 - 15.0 g/dL    HCT 33.6 (*) 36.0 - 46.0 %    MCV 93.1  78.0 - 100.0 fL    MCH 29.1  26.0 - 34.0 pg    MCHC 31.3  30.0 - 36.0 g/dL    RDW 14.7  11.5 - 15.5 %    Platelets 326  150 - 400 K/uL   HEPARIN LEVEL (UNFRACTIONATED)     Status: Normal   Collection Time   03/28/12  6:50 AM      Component Value Range Comment   Heparin Unfractionated 0.64  0.30 - 0.70 IU/mL   BASIC METABOLIC PANEL     Status: Abnormal   Collection Time   03/28/12  6:50 AM      Component Value Range Comment   Sodium 141  135 - 145 mEq/L    Potassium 4.3  3.5 - 5.1 mEq/L    Chloride 100  96 - 112 mEq/L    CO2 29  19 - 32 mEq/L     Glucose, Bld 120 (*) 70 - 99 mg/dL    BUN 66 (*) 6 - 23 mg/dL    Creatinine, Ser 1.70 (*) 0.50 - 1.10 mg/dL    Calcium 10.0  8.4 - 10.5 mg/dL    GFR calc non Af Amer 28 (*) >90 mL/min    GFR calc Af Amer 32 (*) >90 mL/min     Imaging: No results found.  Assessment:  1. Principal Problem: 2.  *Acute respiratory failure 3. Active Problems: 4.  DIABETES MELLITUS, TYPE II 5.  HYPERTENSION - with Cardiac Abnormalities 6.  Non-ischemic cardiomyopathy 7.  Warfarin-induced coagulopathy 8.  H/O aortic valve replacement 9.  Cardiorenal syndrome with renal failure 10.  CAP (community acquired pneumonia) 61.  Acute exacerbation of CHF (congestive heart failure) 12.  COPD exacerbation 13.  Elevated troponin 14.  Encephalopathy acute 15.  S/P AVR (aortic valve replacement) 16.  S/P MVR (mitral valve replacement) 17.  AICD (automatic cardioverter/defibrillator) present 18.  CKD (chronic kidney disease), stage II 19.  Pneumonia 20.   Plan:  1. INR 1.8.  I/o's evenly matched, however, she had 1.5L po intake yesterday which she needs to back off on. I think she definitely needs to be on BID lasix 40 mg at home (  twice her home dose which was not effective when she presented in heart failure). She will need rehabilitation.  She is close to discharge medically if services can be arranged and INR is >2.0 so that we can stop heparin. We discussed short term rehabilitation. She lives in [place] and would be agreeable to [facility].  I have called social work and we will initiate a placement request.  Time Spent Directly with Patient:   15 minutes  Length of Stay:  LOS: 7 days   Pixie Casino, MD, [facility] Attending Cardiologist [facility] 03/28/2012, 8:42 AM

## 2012-03-29 ENCOUNTER — Inpatient Hospital Stay (HOSPITAL_COMMUNITY): Payer: Medicare Other

## 2012-03-29 LAB — CBC
HCT: 30.2 % — ABNORMAL LOW (ref 36.0–46.0)
Hemoglobin: 9.7 g/dL — ABNORMAL LOW (ref 12.0–15.0)
MCH: 29.5 pg (ref 26.0–34.0)
MCHC: 32.1 g/dL (ref 30.0–36.0)
MCV: 91.8 fL (ref 78.0–100.0)
Platelets: 329 10*3/uL (ref 150–400)
RBC: 3.29 MIL/uL — ABNORMAL LOW (ref 3.87–5.11)
RDW: 14.8 % (ref 11.5–15.5)
WBC: 12.1 10*3/uL — ABNORMAL HIGH (ref 4.0–10.5)

## 2012-03-29 LAB — PROTIME-INR
INR: 1.93 — ABNORMAL HIGH (ref 0.00–1.49)
Prothrombin Time: 22.4 seconds — ABNORMAL HIGH (ref 11.6–15.2)

## 2012-03-29 LAB — BASIC METABOLIC PANEL
BUN: 62 mg/dL — ABNORMAL HIGH (ref 6–23)
CO2: 31 mEq/L (ref 19–32)
Calcium: 10.1 mg/dL (ref 8.4–10.5)
Chloride: 98 mEq/L (ref 96–112)
Creatinine, Ser: 2.06 mg/dL — ABNORMAL HIGH (ref 0.50–1.10)
GFR calc Af Amer: 25 mL/min — ABNORMAL LOW (ref >90)
GFR calc non Af Amer: 22 mL/min — ABNORMAL LOW (ref >90)
Glucose, Bld: 123 mg/dL — ABNORMAL HIGH (ref 70–99)
Potassium: 4.3 mEq/L (ref 3.5–5.1)
Sodium: 140 mEq/L (ref 135–145)

## 2012-03-29 LAB — GLUCOSE, CAPILLARY
Glucose-Capillary: 127 mg/dL — ABNORMAL HIGH (ref 70–99)
Glucose-Capillary: 109 mg/dL — ABNORMAL HIGH (ref 70–99)
Glucose-Capillary: 137 mg/dL — ABNORMAL HIGH (ref 70–99)
Glucose-Capillary: 164 mg/dL — ABNORMAL HIGH (ref 70–99)

## 2012-03-29 LAB — HEPARIN LEVEL (UNFRACTIONATED): Heparin Unfractionated: 0.63 IU/mL (ref 0.30–0.70)

## 2012-03-29 MED ORDER — WARFARIN SODIUM 7.5 MG PO TABS
7.5000 mg | ORAL_TABLET | Freq: Once | ORAL | Status: AC
  Administered 2012-03-29: 7.5 mg via ORAL
  Filled 2012-03-29: qty 1

## 2012-03-29 MED ORDER — METOPROLOL SUCCINATE ER 25 MG PO TB24
125.0000 mg | ORAL_TABLET | Freq: Every day | ORAL | Status: DC
  Administered 2012-03-30 – 2012-04-02 (×4): 125 mg via ORAL
  Filled 2012-03-29 (×5): qty 1

## 2012-03-29 NOTE — Progress Notes (Signed)
CARDIAC REHAB PHASE I   PRE:  Rate/Rhythm: pacing 87  BP:  Supine:   Sitting: 111/55  Standing:    SaO2: 93%RA  MODE:  Ambulation: 160 ft   POST:  Rate/Rhythem: 97  BP:  Supine:   Sitting: 113/53  Standing:    SaO2: 93%2L [id number] Pt states nose bleeding so she is trying to not wear oxygen. Put oxygen on for walk. Walked 160 ft on 2L with rolling walker and asst x 1. Gait slow and steady. Tired easily.  Assisted to [facility] after walk. Took oxygen off in room and sats at 90%. Left oxygen within reach and encouraged pt to use if needed. Back to recliner with call bell. Pt thanked me for walk even though at first she was hesitant to go. Tolerated well. Will see pt along with PT.  Jeani Sow

## 2012-03-29 NOTE — Progress Notes (Signed)
Subjective: No complaints awaiting SNF bed for rehab.  Still weak, agrees to go to [facility]   Objective: Vital signs in last 24 hours: Temp:  [98.2 F (36.8 C)-98.4 F (36.9 C)] 98.4 F (36.9 C) (07/05 0454) Pulse Rate:  [70-72] 72  (07/05 0454) Resp:  [20] 20  (07/05 0454) BP: (117-124)/(45-66) 124/66 mmHg (07/05 0454) SpO2:  [78 %-99 %] 96 % (07/05 0454) Weight:  [102 kg (224 lb 13.9 oz)] 102 kg (224 lb 13.9 oz) (07/05 0454) Weight change: -1 kg (-2 lb 3.3 oz) Last BM Date: 03/26/12 Intake/Output from previous day: +677  Weight is stabilizing at 102  Down from 110.2 on admit. -- this is her new "dry weght"  07/04 0701 - 07/05 0700 In: 1385.1 [P.O.:1020; I.V.:365.1] Out: 1401 [Urine:1400; Stool:1] Intake/Output this shift: Total I/O In: 240 [P.O.:240] Out: 72 [Urine:50]  PE: General:No complaints [id number] RRR with click of valves Lungs:decreased breath sounds, no wheezes Abd:+ BS soft non tender Ext:no edema to tr. Neuro:alert and oriented X 3    Lab Results:  Basename 03/29/12 0525 03/28/12 0650  WBC 12.1* 13.1*  HGB 9.7* 10.5*  HCT 30.2* 33.6*  PLT 329 326   BMET  Basename 03/29/12 0525 03/28/12 0650  NA 140 141  K 4.3 4.3  CL 98 100  CO2 31 29  GLUCOSE 123* 120*  BUN 62* 66*  CREATININE 2.06* 1.70*  CALCIUM 10.1 10.0   EKG  Studies/Results: No results found.  Medications: I have reviewed the patient's current medications.    Marland Kitchen aspirin  325 mg Oral Daily  . ezetimibe-simvastatin  1 tablet Oral 3 times weekly  . feeding supplement  1 Container Oral TID WC  . ferrous sulfate  325 mg Oral Q breakfast  . folic acid  1 mg Oral Daily  . furosemide  40 mg Oral BID  . insulin aspart  0-9 Units Subcutaneous TID WC  . isosorbide-hydrALAZINE  1 tablet Oral TID  . metoprolol succinate  125 mg Oral Daily  . pantoprazole  40 mg Oral Q1200  . potassium chloride SA  30 mEq Oral Daily  . sodium chloride  3 mL Intravenous Q12H  . warfarin  7.5 mg Oral  ONCE-1800  . warfarin  7.5 mg Oral ONCE-1800  . Warfarin - Pharmacist Dosing Inpatient   Does not apply q1800  . DISCONTD: metoprolol succinate  100 mg Oral Daily   Assessment/Plan: Principal Problem:  *Acute respiratory failure Active Problems:  Non-ischemic cardiomyopathy  Cardiorenal syndrome with renal failure  CAP (community acquired pneumonia)  Acute exacerbation of CHF (congestive heart failure)  CKD (chronic kidney disease), stage III - due to DM, HTN and cardiorenal syndrome  Pneumonia  HYPERTENSION - with Cardiac Abnormalities  Warfarin-induced coagulopathy  H/O aortic valve replacement  COPD exacerbation  Elevated troponin  S/P AVR (aortic valve replacement)  S/P MVR (mitral valve replacement)  DIABETES MELLITUS, TYPE II  AICD (automatic cardioverter/defibrillator) present  PLAN:Serum Cr. More elevated though pk Cr. Was 3.01  On Lasix 40 mg BID Mild anemia Pro BNP is down from 29,898 to 1,396. INR is up to 1.93.  On Heparin drip for crossover. With mech aortic and mitral valves.   PNA resolving recheck cxr Continue ambulating    LOS: 8 days   Ervine Witucki W 03/29/2012, 11:25 AM   I have seen & examined the patient along with Ms Dorene Ar.  I agree with her findings, examination and recommendations above.  Mrs. Hilgeman has progressed nicely with diuresis.  Her current weight should be documented as her target "dry weight". She is very weak & will benefit from short term rehab.   She continues to have PVCs - and her BP is in the 110s-120s, will increase Torprol to 125mg  daily.   ACE-I/ARB being held due Chronic Renal  Insufficiency (now III) - cardiorenal syndrome.  Using Hydralazine/Nitrate for afterload reduction.  Renal function / Cr seems to have levelled out with the most recent level up a bit.  Her baseline will probably be ~1.9. INR is near therapeutic --> continue with Heparin bridge until > 2. Complete course of Abx for PNA  Kayleigh Broadwell W, M.D.,  M.S. [facility] [address] [place], [place]  [address]  [phone] Pager # [phone]  03/29/2012 11:27 AM

## 2012-03-29 NOTE — Progress Notes (Signed)
Physical Therapy Treatment Patient Details Name: Amberle Garciagonzalez MRN: [id number] DOB: Nov 26, 1931 Today's Date: 03/29/2012 Time: [id number] PT Time Calculation (min): 20 min  PT Assessment / Plan / Recommendation Comments on Treatment Session  Patient limited by dyspnea during mobility.  Continued to improve with gait.    Follow Up Recommendations  Skilled nursing facility    Barriers to Discharge        Equipment Recommendations  Defer to next venue    Recommendations for Other Services    Frequency Min 3X/week   Plan Discharge plan remains appropriate;Frequency remains appropriate    Precautions / Restrictions Precautions Precautions: Fall Restrictions Weight Bearing Restrictions: No       Mobility  Transfers Transfers: Sit to Stand;Stand to Sit Sit to Stand: 4: Min assist;With upper extremity assist;With armrests;From chair/3-in-1 Stand to Sit: 4: Min assist;With upper extremity assist;With armrests;To chair/3-in-1 Details for Transfer Assistance: Cues for safe hand placement, and safety for returning to sitting.  Cues to scoot to edge of chair before standing. Ambulation/Gait Ambulation/Gait Assistance: 4: Min guard Ambulation Distance (Feet): 210 Feet Assistive device: Rolling walker Ambulation/Gait Assistance Details: Cues for upright posture and to look forward during gait for safety. Gait Pattern: Step-through pattern;Decreased stride length;Trunk flexed      PT Goals Acute Rehab PT Goals PT Goal: Sit to Stand - Progress: Progressing toward goal PT Goal: Stand to Sit - Progress: Progressing toward goal PT Transfer Goal: Bed to Chair/Chair to Bed - Progress: Progressing toward goal PT Goal: Ambulate - Progress: Progressing toward goal  Visit Information  Last PT Received On: 03/29/12 Assistance Needed: +1    Subjective Data  Subjective: I'm just so weak   Cognition  Overall Cognitive Status: Appears within functional limits for tasks  assessed/performed Arousal/Alertness: Awake/alert Orientation Level: Appears intact for tasks assessed Behavior During Session: [facility] for tasks performed    Balance     End of Session PT - End of Session Equipment Utilized During Treatment: Gait belt;Oxygen Activity Tolerance: Patient limited by fatigue Patient left: in chair;with call bell/phone within reach Nurse Communication: Mobility status   GP     Despina Pole 03/29/2012, 4:55 PM Carita Pian. Sanjuana Kava, [place] Pager [phone]

## 2012-03-29 NOTE — Progress Notes (Signed)
ANTICOAGULATION CONSULT NOTE - Follow Up Consult  Pharmacy Consult for Heparin and Coumadin Indication: mechanical AVR/MVR  Allergies  Allergen Reactions  . Niacin     REACTION: itching  . Penicillins     Patient Measurements: Height: 5\' 7"  (170.2 cm) Weight: 224 lb 13.9 oz (102 kg) IBW/kg (Calculated) : 61.6  Heparin Dosing Weight: 85 kg  Vital Signs: Temp: 98.4 F (36.9 C) (07/05 0454) Temp src: Oral (07/05 0454) BP: 124/66 mmHg (07/05 0454) Pulse Rate: 72  (07/05 0454)  Labs:  Basename 03/29/12 0525 03/28/12 0650 03/27/12 2031 03/27/12 0535  HGB 9.7* 10.5* -- --  HCT 30.2* 33.6* -- 32.2*  PLT 329 326 -- 319  APTT -- -- -- --  LABPROT 22.4* 21.2* -- 18.2*  INR 1.93* 1.80* -- 1.48  HEPARINUNFRC 0.63 0.64 0.40 --  CREATININE 2.06* 1.70* -- 1.88*  CKTOTAL -- -- -- --  CKMB -- -- -- --  TROPONINI -- -- -- --    Estimated Creatinine Clearance: 27.6 ml/min (by C-G formula based on Cr of 2.06).  Assessment:   Heparin level is now therapeutic on 1150 units/hr.   S/p Vitamin K 5 mg IV on 6/29 (when INR was 8.48). INR = 1.93 today. Heparin level is therapeutic. Plan for dc soon. Dr. Lysbeth Penner note mentioned that heparin can be dced if INR is >2.  Goal of Therapy:  Heparin level = 0.3-0.7 INR = 2-3 Monitor platelets by anticoagulation protocol: Yes   Plan:  Cont heparin at 1150 units/hr Coumadin 7.5mg  PO x1 today

## 2012-03-29 NOTE — Progress Notes (Addendum)
Clinical Social Worker followed up with patient and informed patient of the bed offers received. Patient has bed offers from [facility], [facility] and [facility] SNF's.  Patient stated that she preferred [facility], but they have not responded yet. CSW called and left a message for admissions coordinator at [facility].   10:55am CSW received call from [facility] and they do not have any female beds open. CSW notified patient and encouraged her to speak to family regarding the available facilities. Patient expressed disappointment with [facility] not having any beds available, but was agreeable to speak with her family regarding available choices. Patient stated her family will be coming in this weekend.    Leandro Reasoner MSW, [employer] (Covering Amy Eastman) [phone]

## 2012-03-30 LAB — GLUCOSE, CAPILLARY
Glucose-Capillary: 139 mg/dL — ABNORMAL HIGH (ref 70–99)
Glucose-Capillary: 116 mg/dL — ABNORMAL HIGH (ref 70–99)

## 2012-03-30 LAB — PROTIME-INR
INR: 2.03 — ABNORMAL HIGH (ref 0.00–1.49)
Prothrombin Time: 23.3 seconds — ABNORMAL HIGH (ref 11.6–15.2)

## 2012-03-30 LAB — CBC
HCT: 30.7 % — ABNORMAL LOW (ref 36.0–46.0)
Hemoglobin: 9.8 g/dL — ABNORMAL LOW (ref 12.0–15.0)
MCH: 29.7 pg (ref 26.0–34.0)
MCHC: 31.9 g/dL (ref 30.0–36.0)
MCV: 93 fL (ref 78.0–100.0)
Platelets: 345 10*3/uL (ref 150–400)
RBC: 3.3 MIL/uL — ABNORMAL LOW (ref 3.87–5.11)
RDW: 15.2 % (ref 11.5–15.5)
WBC: 12.6 10*3/uL — ABNORMAL HIGH (ref 4.0–10.5)

## 2012-03-30 LAB — BASIC METABOLIC PANEL
BUN: 57 mg/dL — ABNORMAL HIGH (ref 6–23)
CO2: 28 mEq/L (ref 19–32)
Calcium: 10.2 mg/dL (ref 8.4–10.5)
Chloride: 99 mEq/L (ref 96–112)
Creatinine, Ser: 2.19 mg/dL — ABNORMAL HIGH (ref 0.50–1.10)
GFR calc Af Amer: 24 mL/min — ABNORMAL LOW (ref >90)
GFR calc non Af Amer: 20 mL/min — ABNORMAL LOW (ref >90)
Glucose, Bld: 137 mg/dL — ABNORMAL HIGH (ref 70–99)
Potassium: 4.6 mEq/L (ref 3.5–5.1)
Sodium: 139 mEq/L (ref 135–145)

## 2012-03-30 LAB — HEPARIN LEVEL (UNFRACTIONATED)
Heparin Unfractionated: 0.78 IU/mL — ABNORMAL HIGH (ref 0.30–0.70)
Heparin Unfractionated: 0.73 IU/mL — ABNORMAL HIGH (ref 0.30–0.70)

## 2012-03-30 MED ORDER — FUROSEMIDE 40 MG PO TABS
40.0000 mg | ORAL_TABLET | Freq: Every day | ORAL | Status: DC
  Administered 2012-03-31: 40 mg via ORAL
  Filled 2012-03-30 (×2): qty 1

## 2012-03-30 MED ORDER — HEPARIN (PORCINE) IN NACL 100-0.45 UNIT/ML-% IJ SOLN
900.0000 [IU]/h | INTRAMUSCULAR | Status: DC
  Filled 2012-03-30 (×3): qty 250

## 2012-03-30 MED ORDER — WARFARIN SODIUM 10 MG PO TABS
10.0000 mg | ORAL_TABLET | Freq: Once | ORAL | Status: AC
  Administered 2012-03-30: 10 mg via ORAL
  Filled 2012-03-30: qty 1

## 2012-03-30 NOTE — Progress Notes (Signed)
ANTICOAGULATION CONSULT NOTE - Follow Up Consult  Pharmacy Consult for Heparin and Coumadin Indication: mechanical AVR/MVR  Allergies  Allergen Reactions  . Niacin     REACTION: itching  . Penicillins     Patient Measurements: Height: 5\' 7"  (170.2 cm) Weight: 225 lb 1.4 oz (102.1 kg) IBW/kg (Calculated) : 61.6  Heparin Dosing Weight: 85 kg  Vital Signs: Temp: 98.4 F (36.9 C) (07/06 0414) Temp src: Oral (07/06 0414) BP: 131/48 mmHg (07/06 0414) Pulse Rate: 69  (07/06 0414)  Labs:  Basename 03/30/12 0640 03/29/12 0525 03/28/12 0650  HGB 9.8* 9.7* --  HCT 30.7* 30.2* 33.6*  PLT 345 329 326  APTT -- -- --  LABPROT 23.3* 22.4* 21.2*  INR 2.03* 1.93* 1.80*  HEPARINUNFRC 0.78* 0.63 0.64  CREATININE 2.19* 2.06* 1.70*  CKTOTAL -- -- --  CKMB -- -- --  TROPONINI -- -- --    Estimated Creatinine Clearance: 26 ml/min (by C-G formula based on Cr of 2.19).  Assessment: 78 yo female on heparin for mechanical AVR/MVR. Heparin level is slightly above goal range. No overt bleeding noted, however had some dry blood on tissue from picking at nose per RN. CBC is stable.  Dr. Lysbeth Penner note mentioned that heparin can be discontinued if INR is >2 and it is today. Goal is usually 2.5-3.5 for mechanical MVR and heparin would not be discontinued until INR > 2.5.  Goal of Therapy:  Heparin level 0.3-0.7 units/ml INR 2.5-3.5 Monitor platelets by anticoagulation protocol: Yes   Plan:  1. Decrease heparin to 1050 units/hr 2. MD, please clarify INR goal to determine when heparin should be discontinued

## 2012-03-30 NOTE — Progress Notes (Signed)
CARDIAC REHAB PHASE I   PRE:  Rate/Rhythm: 70 Pacing  BP:  Supine:   Sitting: 123/71  Standing:    SaO2: 98 2L 97 RA  MODE:  Ambulation: 160 ft   POST:  Rate/Rhythem: 92  BP:  Supine:   Sitting: 123/69  Standing:    SaO2: 87 RA 96 2L 1440-1515 Assisted X 1 and used walker to ambulate. Gait steady with walker. Pt c/o of right upper thigh pain with walking, states that it is sore. Walked on RA sat after walk 87%, pt was DOE and tired  by end of walk. O2 replaced after walk sat 96% on 2L. Pt back to recliner after walk with call light in reach.  Deon Pilling

## 2012-03-30 NOTE — Progress Notes (Signed)
ANTICOAGULATION CONSULT NOTE - Follow Up Consult  Pharmacy Consult for Coumadin Indication: mechanical AVR/MVR  Allergies  Allergen Reactions  . Niacin     REACTION: itching  . Penicillins     Patient Measurements: Height: 5\' 7"  (170.2 cm) Weight: 225 lb 1.4 oz (102.1 kg) IBW/kg (Calculated) : 61.6  Heparin Dosing Weight: 85 kg  Vital Signs: Temp: 98.4 F (36.9 C) (07/06 0414) Temp src: Oral (07/06 0414) BP: 131/48 mmHg (07/06 0414) Pulse Rate: 69  (07/06 0414)  Labs:  Basename 03/30/12 0640 03/29/12 0525 03/28/12 0650  HGB 9.8* 9.7* --  HCT 30.7* 30.2* 33.6*  PLT 345 329 326  APTT -- -- --  LABPROT 23.3* 22.4* 21.2*  INR 2.03* 1.93* 1.80*  HEPARINUNFRC 0.78* 0.63 0.64  CREATININE 2.19* 2.06* 1.70*  CKTOTAL -- -- --  CKMB -- -- --  TROPONINI -- -- --    Estimated Creatinine Clearance: 26 ml/min (by C-G formula based on Cr of 2.19).  Assessment:   Heparin level is now therapeutic on 1150 units/hr.   S/p Vitamin K 5 mg IV on 6/29 (when INR was 8.48). INR = 2.03 today. Pt has been getting 7.5mg  qday here and INR has increase very slowly. Will try one higher dose today.  Goal of Therapy:  Heparin level = 0.3-0.7 INR = 2.5-3.5 Monitor platelets by anticoagulation protocol: Yes   Plan:  Coumadin 10mg  PO x1 today

## 2012-03-30 NOTE — Progress Notes (Signed)
Subjective: Had mild nosebleed earlier. No CP or SOB  Objective: Vital signs in last 24 hours: Temp:  [98.1 F (36.7 C)-98.6 F (37 C)] 98.4 F (36.9 C) (07/06 0414) Pulse Rate:  [69-89] 69  (07/06 0414) Resp:  [18-20] 18  (07/06 0414) BP: (108-139)/(48-87) 131/48 mmHg (07/06 0414) SpO2:  [94 %-100 %] 94 % (07/06 0414) Weight:  [102.1 kg (225 lb 1.4 oz)] 102.1 kg (225 lb 1.4 oz) (07/06 0414) Weight change: 0.1 kg (3.5 oz) Last BM Date: 03/29/12 Intake/Output from previous day:-344  Wt 102.1 stable wt. 07/05 0701 - 07/06 0700 In: 978 [P.O.:720; I.V.:258] Out: 950 [Urine:950] Intake/Output this shift: Total I/O In: 240 [P.O.:240] Out: 50 [Urine:50]  PE: General:NAD No JVD Heart: crisp valve clicks; 2/6 SEM Lungs: no wheezing Abd:BS+ Ext:no edema    Lab Results:  Basename 03/30/12 0640 03/29/12 0525  WBC 12.6* 12.1*  HGB 9.8* 9.7*  HCT 30.7* 30.2*  PLT 345 329   BMET  Basename 03/30/12 0640 03/29/12 0525  NA 139 140  K 4.6 4.3  CL 99 98  CO2 28 31  GLUCOSE 137* 123*  BUN 57* 62*  CREATININE 2.19* 2.06*  CALCIUM 10.2 10.1   No results found for this basename: TROPONINI:2,CK,MB:2 in the last 72 hours  Lab Results  Component Value Date   CHOL 120 02/14/2012   HDL 29* 02/14/2012   LDLCALC 57 02/14/2012   TRIG 172* 02/14/2012   CHOLHDL 4.1 02/14/2012   Lab Results  Component Value Date   HGBA1C 6.6* 03/21/2012     Lab Results  Component Value Date   TSH 1.608 02/14/2012   BNP (last 3 results)  Basename 03/27/12 0535 03/24/12 0530 03/23/12 1000  PROBNP 1396.0* 6320.0* 9874.0*    Hepatic Function Panel No results found for this basename: PROT,ALBUMIN,AST,ALT,ALKPHOS,BILITOT,BILIDIR,IBILI in the last 72 hours No results found for this basename: CHOL in the last 72 hours No results found for this basename: PROTIME in the last 72 hours    EKG: Orders placed during the hospital encounter of 03/21/12  . EKG 12-LEAD  . EKG 12-LEAD  . EKG      Studies/Results: Dg Chest 2 View  03/29/2012  *RADIOLOGY REPORT*  Clinical Data: History of pneumonia and congestive heart failure. COPD with weakness and some shortness of breath  CHEST - 2 VIEW  Comparison: 03/23/2012  Findings: A four lead pacer is in place via a left subclavian approach with lead tips stable in appearance and position.  Cardiomegaly and aortic ectasia are unchanged.  Pulmonary vascular congestion without signs of overt congestive failure noted. Interval improvement in aeration of both lung bases has occurred since the previous exam.  No new focal infiltrates are seen and basilar infiltrates have largely cleared since the previous exam. No pleural fluid is noted.  Bony structures demonstrate anterior vertebral body height loss at the approximate T6 vertebral level and are otherwise intact.  IMPRESSION: Improving interstitial edema pattern with clearing density at both lung bases.  No new infiltrates are identified.  Original Report Authenticated By: Ander Gaster, M.D.    Medications: I have reviewed the patient's current medications.    Marland Kitchen aspirin  325 mg Oral Daily  . ezetimibe-simvastatin  1 tablet Oral 3 times weekly  . feeding supplement  1 Container Oral TID WC  . ferrous sulfate  325 mg Oral Q breakfast  . folic acid  1 mg Oral Daily  . furosemide  40 mg Oral BID  . insulin aspart  0-9 Units Subcutaneous TID WC  . isosorbide-hydrALAZINE  1 tablet Oral TID  . metoprolol succinate  125 mg Oral Daily  . pantoprazole  40 mg Oral Q1200  . potassium chloride SA  30 mEq Oral Daily  . sodium chloride  3 mL Intravenous Q12H  . warfarin  7.5 mg Oral ONCE-1800  . Warfarin - Pharmacist Dosing Inpatient   Does not apply q1800  . DISCONTD: metoprolol succinate  100 mg Oral Daily   Assessment/Plan: Patient Active Problem List  Diagnosis  . HYPOTHYROIDISM  . DIABETES MELLITUS, TYPE II  . HYPERLIPIDEMIA  . DEHYDRATION  . OBESITY  . HYPERTENSION - with Cardiac  Abnormalities  . Non-ischemic cardiomyopathy  . UNSPECIFIED CARDIOVASCULAR DISEASE  . VARICOSE VEINS LOWER EXTREMITIES W/OTH COMPS  . GERD  . CHOLELITHIASIS  . CKD (chronic kidney disease) stage 4, GFR 15-29 ml/min  . ACUTE CYSTITIS  . OTHER SIGN AND SYMPTOM IN BREAST  . HIP PAIN, RIGHT  . DEGENERATIVE JOINT DISEASE, SPINE  . INSOMNIA  . FATIGUE  . NAUSEA  . NECK SPASM  . HEART VALVE REPLACED BY OTHER MEANS  . HEADACHE  . Allergic rhinitis  . Low back pain  . Iron deficiency  . Papilloma of breast  . Acute respiratory failure  . Warfarin-induced coagulopathy  . H/O aortic valve replacement  . Cardiorenal syndrome with renal failure  . CAP (community acquired pneumonia)  . Acute exacerbation of CHF (congestive heart failure)  . COPD exacerbation  . Elevated troponin  . S/P AVR (aortic valve replacement)  . S/P MVR (mitral valve replacement)  . AICD (automatic cardioverter/defibrillator) present  . CKD (chronic kidney disease), stage III - due to DM, HTN and cardiorenal syndrome  . Pneumonia   PLAN: Improved cxr.  INR now 2.03 with goal of 2.5 to 3.5  On hep/coumadin crossover, Cr. Still climbing but not at previous peak.  Pt still weak, awaiting bed at [facility] they have no female beds currently, pt is to discuss with family choice of center.  ? Plan for d/c tomorrow. If SNF accepts on Sundays.  Pt uses oxygen at HS only at home, will ambulate on room air to see is Sp02 drops.  She is having occ. Nose bleed.     LOS: 9 days   INGOLD,LAURA R 03/30/2012, 9:59 AM   Patient seen and examined. Agree with assessment and plan. Cr increased to 2.19 today.  Will change Lasix to daily rather than BID dosing. INR 2.03. For SNF transfer once INR at goal. Will recheck BNP in am.   Troy Sine, MD, [facility] 03/30/2012 10:27 AM

## 2012-03-30 NOTE — Progress Notes (Signed)
ANTICOAGULATION CONSULT NOTE - Follow Up Consult  Pharmacy Consult for Heparin Indication: mechanical AVR/MVR  Allergies  Allergen Reactions  . Niacin     REACTION: itching  . Penicillins     Patient Measurements: Height: 5\' 7"  (170.2 cm) Weight: 225 lb 1.4 oz (102.1 kg) IBW/kg (Calculated) : 61.6  Heparin Dosing Weight: 85 kg  Vital Signs: Temp: 97.7 F (36.5 C) (07/06 1314) Temp src: Axillary (07/06 1314) BP: 108/42 mmHg (07/06 1314) Pulse Rate: 72  (07/06 1314)  Labs:  Basename 03/30/12 1725 03/30/12 0640 03/29/12 0525 03/28/12 0650  HGB -- 9.8* 9.7* --  HCT -- 30.7* 30.2* 33.6*  PLT -- 345 329 326  APTT -- -- -- --  LABPROT -- 23.3* 22.4* 21.2*  INR -- 2.03* 1.93* 1.80*  HEPARINUNFRC 0.73* 0.78* 0.63 --  CREATININE -- 2.19* 2.06* 1.70*  CKTOTAL -- -- -- --  CKMB -- -- -- --  TROPONINI -- -- -- --    Estimated Creatinine Clearance: 26 ml/min (by C-G formula based on Cr of 2.19).  Assessment: 78 yo female on heparin for mechanical AVR/MVR.  No overt bleeding noted, however had some dry blood on tissue from picking at nose per RN. CBC is stable. Heparin still slightly supratherapeutic.   Goal of Therapy:  Heparin level 0.3-0.7 units/ml INR 2.5-3.5 Monitor platelets by anticoagulation protocol: Yes   Plan:  1. Decrease heparin to 900 units/hr 2. F/u with AM heparin level

## 2012-03-31 LAB — BASIC METABOLIC PANEL
BUN: 51 mg/dL — ABNORMAL HIGH (ref 6–23)
CO2: 32 mEq/L (ref 19–32)
Calcium: 9.8 mg/dL (ref 8.4–10.5)
Chloride: 100 mEq/L (ref 96–112)
Creatinine, Ser: 2.08 mg/dL — ABNORMAL HIGH (ref 0.50–1.10)
GFR calc Af Amer: 25 mL/min — ABNORMAL LOW (ref >90)
GFR calc non Af Amer: 22 mL/min — ABNORMAL LOW (ref >90)
Glucose, Bld: 117 mg/dL — ABNORMAL HIGH (ref 70–99)
Potassium: 4.7 mEq/L (ref 3.5–5.1)
Sodium: 140 mEq/L (ref 135–145)

## 2012-03-31 LAB — GLUCOSE, CAPILLARY
Glucose-Capillary: 120 mg/dL — ABNORMAL HIGH (ref 70–99)
Glucose-Capillary: 127 mg/dL — ABNORMAL HIGH (ref 70–99)

## 2012-03-31 LAB — CBC
HCT: 27.2 % — ABNORMAL LOW (ref 36.0–46.0)
Hemoglobin: 8.6 g/dL — ABNORMAL LOW (ref 12.0–15.0)
MCH: 29.7 pg (ref 26.0–34.0)
MCHC: 31.6 g/dL (ref 30.0–36.0)
MCV: 93.8 fL (ref 78.0–100.0)
Platelets: 268 10*3/uL (ref 150–400)
RBC: 2.9 MIL/uL — ABNORMAL LOW (ref 3.87–5.11)
RDW: 15.4 % (ref 11.5–15.5)
WBC: 10.4 10*3/uL (ref 4.0–10.5)

## 2012-03-31 LAB — PROTIME-INR
INR: 2 — ABNORMAL HIGH (ref 0.00–1.49)
Prothrombin Time: 23 seconds — ABNORMAL HIGH (ref 11.6–15.2)

## 2012-03-31 LAB — HEPARIN LEVEL (UNFRACTIONATED)
Heparin Unfractionated: 0.65 IU/mL (ref 0.30–0.70)
Heparin Unfractionated: 0.21 IU/mL — ABNORMAL LOW (ref 0.30–0.70)

## 2012-03-31 LAB — PRO B NATRIURETIC PEPTIDE: Pro B Natriuretic peptide (BNP): 1042 pg/mL — ABNORMAL HIGH (ref 0–450)

## 2012-03-31 MED ORDER — ASPIRIN 81 MG PO TBEC: 81.0000 mg | DELAYED_RELEASE_TABLET | Freq: Every day | ORAL | Status: DC

## 2012-03-31 MED ORDER — SIMETHICONE 80 MG PO CHEW
80.0000 mg | CHEWABLE_TABLET | Freq: Four times a day (QID) | ORAL | Status: DC | PRN
  Administered 2012-03-31: 80 mg via ORAL
  Filled 2012-03-31: qty 1

## 2012-03-31 MED ORDER — ASPIRIN EC 81 MG PO TBEC
81.0000 mg | DELAYED_RELEASE_TABLET | Freq: Every day | ORAL | Status: DC
  Administered 2012-03-31 – 2012-04-02 (×3): 81 mg via ORAL
  Filled 2012-03-31 (×3): qty 1

## 2012-03-31 MED ORDER — WARFARIN SODIUM 10 MG PO TABS
10.0000 mg | ORAL_TABLET | Freq: Once | ORAL | Status: AC
  Administered 2012-03-31: 10 mg via ORAL
  Filled 2012-03-31: qty 1

## 2012-03-31 NOTE — Progress Notes (Signed)
Subjective: No complaints, no nose bleed currently  Objective: Vital signs in last 24 hours: Temp:  [97.7 F (36.5 C)-98.6 F (37 C)] 98 F (36.7 C) (07/07 0601) Pulse Rate:  [70-73] 73  (07/07 0601) Resp:  [18] 18  (07/07 0601) BP: (108-138)/(42-63) 138/63 mmHg (07/07 0601) SpO2:  [92 %-99 %] 92 % (07/07 0601) Weight:  [102.2 kg (225 lb 5 oz)] 102.2 kg (225 lb 5 oz) (07/07 0601) Weight change: 0.1 kg (3.5 oz) Last BM Date: 03/30/12 Intake/Output from previous day: -373  Wt 102.2 stable 07/06 0701 - 07/07 0700 In: 780 [P.O.:780] Out: 1325 [Urine:1325] Intake/Output this shift: Total I/O In: 220 [P.O.:220] Out: 300 [Urine:300]  PE: General:alert and oriented Heart:S1S2 RRR Lungs:clear Abd:+ BS soft non tender Ext:no edema    Lab Results:  Basename 03/31/12 0530 03/30/12 0640  WBC 10.4 12.6*  HGB 8.6* 9.8*  HCT 27.2* 30.7*  PLT 268 345   BMET  Basename 03/31/12 0530 03/30/12 0640  NA 140 139  K 4.7 4.6  CL 100 99  CO2 32 28  GLUCOSE 117* 137*  BUN 51* 57*  CREATININE 2.08* 2.19*  CALCIUM 9.8 10.2   No results found for this basename: TROPONINI:2,CK,MB:2 in the last 72 hours  Lab Results  Component Value Date   CHOL 120 02/14/2012   HDL 29* 02/14/2012   LDLCALC 57 02/14/2012   TRIG 172* 02/14/2012   CHOLHDL 4.1 02/14/2012   Lab Results  Component Value Date   HGBA1C 6.6* 03/21/2012     Lab Results  Component Value Date   TSH 1.608 02/14/2012    Hepatic Function Panel No results found for this basename: PROT,ALBUMIN,AST,ALT,ALKPHOS,BILITOT,BILIDIR,IBILI in the last 72 hours No results found for this basename: CHOL in the last 72 hours No results found for this basename: PROTIME in the last 72 hours    EKG: Orders placed during the hospital encounter of 03/21/12  . EKG 12-LEAD  . EKG 12-LEAD  . EKG    Studies/Results: Dg Chest 2 View  03/29/2012  *RADIOLOGY REPORT*  Clinical Data: History of pneumonia and congestive heart failure. COPD with  weakness and some shortness of breath  CHEST - 2 VIEW  Comparison: 03/23/2012  Findings: A four lead pacer is in place via a left subclavian approach with lead tips stable in appearance and position.  Cardiomegaly and aortic ectasia are unchanged.  Pulmonary vascular congestion without signs of overt congestive failure noted. Interval improvement in aeration of both lung bases has occurred since the previous exam.  No new focal infiltrates are seen and basilar infiltrates have largely cleared since the previous exam. No pleural fluid is noted.  Bony structures demonstrate anterior vertebral body height loss at the approximate T6 vertebral level and are otherwise intact.  IMPRESSION: Improving interstitial edema pattern with clearing density at both lung bases.  No new infiltrates are identified.  Original Report Authenticated By: Ander Gaster, M.D.    Medications: I have reviewed the patient's current medications.    Marland Kitchen aspirin  325 mg Oral Daily  . ezetimibe-simvastatin  1 tablet Oral 3 times weekly  . feeding supplement  1 Container Oral TID WC  . ferrous sulfate  325 mg Oral Q breakfast  . folic acid  1 mg Oral Daily  . furosemide  40 mg Oral Daily  . insulin aspart  0-9 Units Subcutaneous TID WC  . isosorbide-hydrALAZINE  1 tablet Oral TID  . metoprolol succinate  125 mg Oral Daily  . pantoprazole  40 mg Oral Q1200  . potassium chloride SA  30 mEq Oral Daily  . sodium chloride  3 mL Intravenous Q12H  . warfarin  10 mg Oral ONCE-1800  . Warfarin - Pharmacist Dosing Inpatient   Does not apply q1800  . DISCONTD: furosemide  40 mg Oral BID   Assessment/Plan: Patient Active Problem List  Diagnosis  . HYPOTHYROIDISM  . DIABETES MELLITUS, TYPE II  . HYPERLIPIDEMIA  . DEHYDRATION  . OBESITY  . HYPERTENSION - with Cardiac Abnormalities  . Non-ischemic cardiomyopathy  . UNSPECIFIED CARDIOVASCULAR DISEASE  . VARICOSE VEINS LOWER EXTREMITIES W/OTH COMPS  . GERD  . CHOLELITHIASIS  .  CKD (chronic kidney disease) stage 4, GFR 15-29 ml/min  . ACUTE CYSTITIS  . OTHER SIGN AND SYMPTOM IN BREAST  . HIP PAIN, RIGHT  . DEGENERATIVE JOINT DISEASE, SPINE  . INSOMNIA  . FATIGUE  . NAUSEA  . NECK SPASM  . HEART VALVE REPLACED BY OTHER MEANS  . HEADACHE  . Allergic rhinitis  . Low back pain  . Iron deficiency  . Papilloma of breast  . Acute respiratory failure  . Warfarin-induced coagulopathy  . H/O aortic valve replacement  . Cardiorenal syndrome with renal failure  . CAP (community acquired pneumonia)  . Acute exacerbation of CHF (congestive heart failure)  . COPD exacerbation  . Elevated troponin  . S/P AVR (aortic valve replacement)  . S/P MVR (mitral valve replacement)  . AICD (automatic cardioverter/defibrillator) present  . CKD (chronic kidney disease), stage III - due to DM, HTN and cardiorenal syndrome  . Pneumonia   PLAN: Improved Cr. Decreasing Lasix.  D/C Heparin with drop in H/H.  And recent nose bleeds,  Was to be d/c'd yesterday, due to bleeding even though INR is not yet therapeutic.  Continue to ambulate.  Plan for discharge to SNF for rehab tomorrow.  Will ask nurse to document Sp02 on room air with ambulation.   LOS: 10 days   INGOLD,LAURA R 03/31/2012, 9:49 AM     Patient seen and examined. Agree with assessment and plan. Cr slightly improved to 2.08 on reduced lasix dosing. No further nose bleeding. C/O mild gas. INR today 2.0.  For SNF tomorrow.   Troy Sine, MD, [facility] 03/31/2012 10:22 AM

## 2012-03-31 NOTE — Progress Notes (Signed)
ANTICOAGULATION CONSULT NOTE - Follow Up Consult  Pharmacy Consult for heparin Indication: AVR/MVR  Labs:  Basename 03/31/12 0530 03/30/12 1725 03/30/12 0640 03/29/12 0525  HGB 8.6* -- 9.8* --  HCT 27.2* -- 30.7* 30.2*  PLT 268 -- 345 329  APTT -- -- -- --  LABPROT 23.0* -- 23.3* 22.4*  INR 2.00* -- 2.03* 1.93*  HEPARINUNFRC 0.65 0.73* 0.78* --  CREATININE 2.08* -- 2.19* 2.06*  CKTOTAL -- -- -- --  CKMB -- -- -- --  TROPONINI -- -- -- --    Assessment/Plan: 78yo female now therapeutic on heparin after several rate decreases though remains at high end of goal.  Will continue at current rate and confirm stable with additional level.  Rogue Bussing PharmD BCPS 03/31/2012,7:05 AM

## 2012-03-31 NOTE — Progress Notes (Addendum)
ANTICOAGULATION CONSULT NOTE - Follow Up Consult  Pharmacy Consult for Coumadin Indication: mechanical AVR/MVR  Allergies  Allergen Reactions  . Niacin     REACTION: itching  . Penicillins     Patient Measurements: Height: 5\' 7"  (170.2 cm) Weight: 225 lb 5 oz (102.2 kg) (Scale B.) IBW/kg (Calculated) : 61.6  Heparin Dosing Weight: 85 kg  Vital Signs: Temp: 98.2 F (36.8 C) (07/07 1025) Temp src: Oral (07/07 1025) BP: 109/54 mmHg (07/07 1025) Pulse Rate: 71  (07/07 1025)  Labs:  Basename 03/31/12 1124 03/31/12 0530 03/30/12 1725 03/30/12 0640 03/29/12 0525  HGB -- 8.6* -- 9.8* --  HCT -- 27.2* -- 30.7* 30.2*  PLT -- 268 -- 345 329  APTT -- -- -- -- --  LABPROT -- 23.0* -- 23.3* 22.4*  INR -- 2.00* -- 2.03* 1.93*  HEPARINUNFRC 0.21* 0.65 0.73* -- --  CREATININE -- 2.08* -- 2.19* 2.06*  CKTOTAL -- -- -- -- --  CKMB -- -- -- -- --  TROPONINI -- -- -- -- --    Estimated Creatinine Clearance: 27.4 ml/min (by C-G formula based on Cr of 2.08).  Assessment: 78 yr old female on coumadin for mechanical AVR/MVR. S/p Vitamin K 5 mg IV on 6/29 (when INR was 8.48). INR = 2.00 today-decreased a little.  H/H drop-heparin dced.  Nose bleed now stopped. Will continue with higher dose of 10mg .   Goal of Therapy:  INR = 2.5-3.5 Monitor platelets by anticoagulation protocol: Yes   Plan:  Coumadin 10mg  PO x1 today Daily PT/INR.  Jerrye Beavers, PharmD, BCPS Clinical Pharmacist  Pager: [phone]

## 2012-04-01 DIAGNOSIS — Z7901 Long term (current) use of anticoagulants: Secondary | ICD-10-CM

## 2012-04-01 LAB — CBC
HCT: 26.8 % — ABNORMAL LOW (ref 36.0–46.0)
Hemoglobin: 8.6 g/dL — ABNORMAL LOW (ref 12.0–15.0)
MCH: 29.6 pg (ref 26.0–34.0)
MCHC: 32.1 g/dL (ref 30.0–36.0)
MCV: 92.1 fL (ref 78.0–100.0)
Platelets: 245 10*3/uL (ref 150–400)
RBC: 2.91 MIL/uL — ABNORMAL LOW (ref 3.87–5.11)
RDW: 15.3 % (ref 11.5–15.5)
WBC: 10.4 10*3/uL (ref 4.0–10.5)

## 2012-04-01 LAB — PROTIME-INR
INR: 2.04 — ABNORMAL HIGH (ref 0.00–1.49)
INR: 2.03 — ABNORMAL HIGH (ref 0.00–1.49)
Prothrombin Time: 23.4 seconds — ABNORMAL HIGH (ref 11.6–15.2)
Prothrombin Time: 23.3 seconds — ABNORMAL HIGH (ref 11.6–15.2)

## 2012-04-01 LAB — BASIC METABOLIC PANEL
BUN: 43 mg/dL — ABNORMAL HIGH (ref 6–23)
BUN: 39 mg/dL — ABNORMAL HIGH (ref 6–23)
CO2: 31 mEq/L (ref 19–32)
CO2: 30 mEq/L (ref 19–32)
Calcium: 10 mg/dL (ref 8.4–10.5)
Calcium: 10.1 mg/dL (ref 8.4–10.5)
Chloride: 100 mEq/L (ref 96–112)
Chloride: 96 mEq/L (ref 96–112)
Creatinine, Ser: 1.95 mg/dL — ABNORMAL HIGH (ref 0.50–1.10)
Creatinine, Ser: 2 mg/dL — ABNORMAL HIGH (ref 0.50–1.10)
GFR calc Af Amer: 27 mL/min — ABNORMAL LOW (ref >90)
GFR calc Af Amer: 26 mL/min — ABNORMAL LOW (ref >90)
GFR calc non Af Amer: 23 mL/min — ABNORMAL LOW (ref >90)
GFR calc non Af Amer: 23 mL/min — ABNORMAL LOW (ref >90)
Glucose, Bld: 112 mg/dL — ABNORMAL HIGH (ref 70–99)
Glucose, Bld: 183 mg/dL — ABNORMAL HIGH (ref 70–99)
Potassium: 4.8 mEq/L (ref 3.5–5.1)
Potassium: 4.6 mEq/L (ref 3.5–5.1)
Sodium: 139 mEq/L (ref 135–145)
Sodium: 136 mEq/L (ref 135–145)

## 2012-04-01 LAB — GLUCOSE, CAPILLARY
Glucose-Capillary: 142 mg/dL — ABNORMAL HIGH (ref 70–99)
Glucose-Capillary: 122 mg/dL — ABNORMAL HIGH (ref 70–99)
Glucose-Capillary: 148 mg/dL — ABNORMAL HIGH (ref 70–99)
Glucose-Capillary: 116 mg/dL — ABNORMAL HIGH (ref 70–99)
Glucose-Capillary: 206 mg/dL — ABNORMAL HIGH (ref 70–99)
Glucose-Capillary: 108 mg/dL — ABNORMAL HIGH (ref 70–99)
Glucose-Capillary: 123 mg/dL — ABNORMAL HIGH (ref 70–99)
Glucose-Capillary: 126 mg/dL — ABNORMAL HIGH (ref 70–99)
Glucose-Capillary: 147 mg/dL — ABNORMAL HIGH (ref 70–99)
Glucose-Capillary: 121 mg/dL — ABNORMAL HIGH (ref 70–99)

## 2012-04-01 LAB — PRO B NATRIURETIC PEPTIDE: Pro B Natriuretic peptide (BNP): 1682 pg/mL — ABNORMAL HIGH (ref 0–450)

## 2012-04-01 LAB — HEPARIN LEVEL (UNFRACTIONATED): Heparin Unfractionated: <0.1 IU/mL — ABNORMAL LOW (ref 0.30–0.70)

## 2012-04-01 MED ORDER — LANOXIN 125 MCG PO TABS: 0.0063 ug | ORAL_TABLET | Freq: Every day | ORAL | Status: DC

## 2012-04-01 MED ORDER — ENSURE PUDDING PO PUDG: 1.0000 | Freq: Three times a day (TID) | ORAL | Status: DC

## 2012-04-01 MED ORDER — TRAMADOL HCL 50 MG PO TABS
50.0000 mg | ORAL_TABLET | Freq: Four times a day (QID) | ORAL | Status: DC | PRN
  Administered 2012-04-01 – 2012-04-02 (×2): 50 mg via ORAL
  Filled 2012-04-01 (×2): qty 1

## 2012-04-01 MED ORDER — LEVOTHYROXINE SODIUM 75 MCG PO TABS
75.0000 ug | ORAL_TABLET | Freq: Every day | ORAL | Status: DC
  Administered 2012-04-02: 75 ug via ORAL
  Filled 2012-04-01 (×2): qty 1

## 2012-04-01 MED ORDER — WARFARIN SODIUM 5 MG PO TABS: 5.0000 mg | ORAL_TABLET | Freq: Every day | ORAL | Status: DC

## 2012-04-01 MED ORDER — FUROSEMIDE 40 MG PO TABS
40.0000 mg | ORAL_TABLET | Freq: Two times a day (BID) | ORAL | Status: DC
  Administered 2012-04-01 – 2012-04-02 (×3): 40 mg via ORAL
  Filled 2012-04-01 (×5): qty 1

## 2012-04-01 MED ORDER — ALPRAZOLAM 0.25 MG PO TABS: 0.2500 mg | ORAL_TABLET | Freq: Three times a day (TID) | ORAL | Status: DC | PRN

## 2012-04-01 MED ORDER — ACETAMINOPHEN 325 MG PO TABS: 650.0000 mg | ORAL_TABLET | Freq: Four times a day (QID) | ORAL | Status: AC | PRN

## 2012-04-01 MED ORDER — ASPIRIN 81 MG PO TBEC: 81.0000 mg | DELAYED_RELEASE_TABLET | Freq: Every day | ORAL | Status: DC

## 2012-04-01 MED ORDER — FUROSEMIDE 40 MG PO TABS: 40.0000 mg | ORAL_TABLET | Freq: Two times a day (BID) | ORAL | Status: DC

## 2012-04-01 MED ORDER — WARFARIN SODIUM 10 MG PO TABS
10.0000 mg | ORAL_TABLET | Freq: Once | ORAL | Status: AC
  Administered 2012-04-01: 10 mg via ORAL
  Filled 2012-04-01: qty 1

## 2012-04-01 MED ORDER — SIMETHICONE 80 MG PO CHEW: 80.0000 mg | CHEWABLE_TABLET | Freq: Four times a day (QID) | ORAL | Status: DC | PRN

## 2012-04-01 MED ORDER — DIGOXIN 0.0625 MG HALF TABLET
0.0625 mg | ORAL_TABLET | Freq: Every day | ORAL | Status: DC
  Administered 2012-04-01 – 2012-04-02 (×2): 0.0625 mg via ORAL
  Filled 2012-04-01 (×2): qty 1

## 2012-04-01 MED ORDER — METOPROLOL SUCCINATE ER 25 MG PO TB24: 125.0000 mg | ORAL_TABLET | Freq: Every day | ORAL | Status: DC

## 2012-04-01 NOTE — Progress Notes (Signed)
Subjective: No complaints, still waiting to hear if [facility] available.   Objective: Vital signs in last 24 hours: Temp:  [98.2 F (36.8 C)-99 F (37.2 C)] 98.6 F (37 C) (07/08 0500) Pulse Rate:  [70-79] 71  (07/08 0500) Resp:  [18] 18  (07/08 0500) BP: (103-128)/(43-54) 128/54 mmHg (07/08 0500) SpO2:  [92 %-95 %] 95 % (07/08 0500) Weight:  [102.5 kg (225 lb 15.5 oz)] 102.5 kg (225 lb 15.5 oz) (07/08 0500) Weight change: 0.3 kg (10.6 oz) Last BM Date: 03/31/12 Intake/Output from previous day: -722  Wt 102.5 stable on 40 lasix daily 07/07 0701 - 07/08 0700 In: 44 [P.O.:880] Out: 1602 [Urine:1600; Stool:2] Intake/Output this shift: Total I/O In: 360 [P.O.:360] Out: 100 [Urine:100]  PE: General:alert and oriented [id number] RRR with click of valves soft aortic murmur Lungs:+ Rhonchi no wheezes Abd:+ BS soft non tender Ext:no edema.    Lab Results:  Basename 04/01/12 0725 03/31/12 0530  WBC 10.4 10.4  HGB 8.6* 8.6*  HCT 26.8* 27.2*  PLT 245 268   BMET  Basename 04/01/12 0725 03/31/12 0530  NA 139 140  K 4.8 4.7  CL 100 100  CO2 31 32  GLUCOSE 112* 117*  BUN 43* 51*  CREATININE 1.95* 2.08*  CALCIUM 10.0 9.8   No results found for this basename: TROPONINI:2,CK,MB:2 in the last 72 hours  Lab Results  Component Value Date   CHOL 120 02/14/2012   HDL 29* 02/14/2012   LDLCALC 57 02/14/2012   TRIG 172* 02/14/2012   CHOLHDL 4.1 02/14/2012   Lab Results  Component Value Date   HGBA1C 6.6* 03/21/2012     Lab Results  Component Value Date   TSH 1.608 02/14/2012      EKG: Orders placed during the hospital encounter of 03/21/12  . EKG 12-LEAD  . EKG 12-LEAD  . EKG    Studies/Results: No results found.  Medications: I have reviewed the patient's current medications.    Marland Kitchen aspirin EC  81 mg Oral Daily  . ezetimibe-simvastatin  1 tablet Oral 3 times weekly  . feeding supplement  1 Container Oral TID WC  . ferrous sulfate  325 mg Oral Q breakfast  .  folic acid  1 mg Oral Daily  . furosemide  40 mg Oral Daily  . insulin aspart  0-9 Units Subcutaneous TID WC  . isosorbide-hydrALAZINE  1 tablet Oral TID  . metoprolol succinate  125 mg Oral Daily  . pantoprazole  40 mg Oral Q1200  . potassium chloride SA  30 mEq Oral Daily  . sodium chloride  3 mL Intravenous Q12H  . warfarin  10 mg Oral ONCE-1800  . warfarin  10 mg Oral ONCE-1800  . Warfarin - Pharmacist Dosing Inpatient   Does not apply q1800  . DISCONTD: aspirin  81 mg Oral Daily  . DISCONTD: aspirin  325 mg Oral Daily   Assessment/Plan: Patient Active Problem List  Diagnosis  . HYPOTHYROIDISM  . DIABETES MELLITUS, TYPE II  . HYPERLIPIDEMIA  . DEHYDRATION  . OBESITY  . HYPERTENSION - with Cardiac Abnormalities  . Non-ischemic cardiomyopathy  . UNSPECIFIED CARDIOVASCULAR DISEASE  . VARICOSE VEINS LOWER EXTREMITIES W/OTH COMPS  . GERD  . CHOLELITHIASIS  . CKD (chronic kidney disease) stage 4, GFR 15-29 ml/min  . ACUTE CYSTITIS  . OTHER SIGN AND SYMPTOM IN BREAST  . HIP PAIN, RIGHT  . DEGENERATIVE JOINT DISEASE, SPINE  . INSOMNIA  . FATIGUE  . NAUSEA  . NECK SPASM  .  HEART VALVE REPLACED BY OTHER MEANS  . HEADACHE  . Allergic rhinitis  . Low back pain  . Iron deficiency  . Papilloma of breast  . Acute respiratory failure  . Warfarin-induced coagulopathy  . H/O aortic valve replacement  . Cardiorenal syndrome with renal failure  . CAP (community acquired pneumonia)  . Acute exacerbation of CHF (congestive heart failure)  . COPD exacerbation  . Elevated troponin  . S/P AVR (aortic valve replacement)  . S/P MVR (mitral valve replacement)  . AICD (automatic cardioverter/defibrillator) present  . CKD (chronic kidney disease), stage III - due to DM, HTN and cardiorenal syndrome  . Pneumonia   PLAN: SP02 improved with ambulation.  Plan for SNF at [facility] center.  INR 2.04.  On lasix 40 once daily, wt stable but PRO BNP is climbing     Have increased lasix back to  BID  LOS: 11 days   INGOLD,LAURA R 04/01/2012, 10:02 AM  I have seen and examined the patient along with [facility] R, NP.  I have reviewed the chart, notes and new data.  I agree with NP's note.  Tight balance between renal insufficiency and CHF. May need an alternating schedule of daily/twice daily furosemide.  PLAN: DC to SNF when bed available.  Sanda Klein, MD, [place] [phone] 04/01/2012, 11:54 AM

## 2012-04-01 NOTE — Progress Notes (Signed)
Clinical Social Worker received call from [place] at [facility] and she will need paperwork to be signed by 2:30pm by family, or the patient can complete admission paperwork by phone. Mardene Celeste will also need discharge summary by 4:30pm today. Patient stated that she prefers to be transported by private vehicle to facility at discharge.   Leandro Reasoner MSW, [place]

## 2012-04-01 NOTE — Progress Notes (Signed)
Clinical Social Worker reviewed chart and saw that the MD anticipates discharge today. CSW followed up with patient regarding decision on SNF placement. Patient was very hesitant on the available SNF's because she wanted [facility] and they did not have any female beds available. CSW spoke with patient further regarding her second choice and patient stated that [facility] would be second choice. CSW informed patient that [facility] is considering, but they have not confirmed or denied bed availability. CSW will follow up with patient around 12:00pm for decision on SNF bed.  Leandro Reasoner MSW, [place]

## 2012-04-01 NOTE — Progress Notes (Signed)
Clinical Social Worker spoke with [name] [name] at [place], [place] and it is fine for patient to be discharged tomorrow morning to facility.   Leandro Reasoner MSW, [place]

## 2012-04-01 NOTE — Progress Notes (Signed)
Physical Therapy Treatment Patient Details Name: [name] [name] MRN: [id number] DOB: [birth year] Today's Date: 04/01/2012 Time: [id number] PT Time Calculation (min): 34 min  PT Assessment / Plan / Recommendation Comments on Treatment Session       Follow Up Recommendations  Skilled nursing facility    Barriers to Discharge        Equipment Recommendations  Defer to next venue    Recommendations for Other Services    Frequency Min 3X/week   Plan Discharge plan remains appropriate;Frequency remains appropriate    Precautions / Restrictions Precautions Precautions: Fall Restrictions Weight Bearing Restrictions: No   Pertinent Vitals/Pain No c/o pain    Mobility  Bed Mobility Bed Mobility: Not assessed Transfers Transfers: Sit to Stand;Stand to Sit Sit to Stand: 5: Supervision;From chair/3-in-1 Stand to Sit: 5: Supervision;To chair/3-in-1 Details for Transfer Assistance: Cues for safe hand placement, and safety for returning to sitting.  Cues to scoot to edge of chair before standing. Ambulation/Gait Ambulation/Gait Assistance: 4: Min assist Ambulation Distance (Feet): 80 Feet Assistive device: Straight cane Ambulation/Gait Assistance Details: Pt required two standing rest breaks to amublate 80 feet secondary to c/o mild to moderate dizziness. SpO2 dropped to high 80s and returned to 90s with rest on Room air.    Gait unsteady and slow. Insturcted pt to continue using walker for now.  Gait Pattern: Step-through pattern;Decreased stride length;Trunk flexed Stairs: No Wheelchair Mobility Wheelchair Mobility: No    Exercises Total Joint Exercises Ankle Circles/Pumps: 10 reps;Both Straight Leg Raises: Both;10 reps Marching in Standing: Both;10 reps   PT Diagnosis:    PT Problem List:   PT Treatment Interventions:     PT Goals Acute Rehab PT Goals PT Goal Formulation: With patient Time For Goal Achievement: 04/11/12 Potential to Achieve Goals: Good Pt will go Sit to  Stand: with modified independence PT Goal: Sit to Stand - Progress: Progressing toward goal Pt will go Stand to Sit: with modified independence PT Goal: Stand to Sit - Progress: Progressing toward goal Pt will Transfer Bed to Chair/Chair to Bed: with modified independence PT Transfer Goal: Bed to Chair/Chair to Bed - Progress: Progressing toward goal Pt will Ambulate: >150 feet;with modified independence;with least restrictive assistive device PT Goal: Ambulate - Progress: Progressing toward goal Pt will Perform Home Exercise Program: Independently PT Goal: Perform Home Exercise Program - Progress: Progressing toward goal  Visit Information  Last PT Received On: 04/01/12    Subjective Data  Subjective: I'm just so weak   Cognition  Overall Cognitive Status: Appears within functional limits for tasks assessed/performed Arousal/Alertness: Awake/alert Orientation Level: Appears intact for tasks assessed Behavior During Session: [facility] for tasks performed    Balance     End of Session PT - End of Session Equipment Utilized During Treatment: Gait belt Activity Tolerance: Patient limited by fatigue Nurse Communication: Mobility status   GP     [name] [name] 04/01/2012, 6:34 PM [name] [name] DPT [phone]

## 2012-04-01 NOTE — Progress Notes (Signed)
ANTICOAGULATION CONSULT NOTE - Follow Up Consult  Pharmacy Consult for Coumadin Indication: mechanical AVR/MVR  Allergies  Allergen Reactions  . Niacin     REACTION: itching  . Penicillins     Labs:  Basename 04/01/12 0725 03/31/12 1124 03/31/12 0530 03/30/12 0640  HGB 8.6* -- 8.6* --  HCT 26.8* -- 27.2* 30.7*  PLT 245 -- 268 345  APTT -- -- -- --  LABPROT 23.4* -- 23.0* 23.3*  INR 2.04* -- 2.00* 2.03*  HEPARINUNFRC <0.10* 0.21* 0.65 --  CREATININE 1.95* -- 2.08* 2.19*  CKTOTAL -- -- -- --  CKMB -- -- -- --  TROPONINI -- -- -- --    Estimated Creatinine Clearance: 29.3 ml/min (by C-G formula based on Cr of 1.95).  Assessment: 78 yr old female on coumadin for mechanical AVR/MVR. S/p Vitamin K 5 mg IV on 6/29 (when INR was 8.48).  CBC still decreased  Goal of Therapy:  INR = 2.5-3.5 Monitor platelets by anticoagulation protocol: Yes   Plan:  Coumadin 10mg  PO x1 today Daily PT/INR.  Anette Guarneri, PharmD [phone]

## 2012-04-01 NOTE — Progress Notes (Signed)
CARDIAC REHAB PHASE I   PRE:  Rate/Rhythm: 71 Pacing  BP:  Supine:   Sitting: 103/44  Standing:    SaO2: 98 RA  MODE:  Ambulation: 220 ft   POST:  Rate/Rhythem: 92  BP:  Supine:   Sitting: 132/48  Standing:    SaO2: 91 RA during walk 91 RA after walk 0850-0940 Assisted X 1 and used walker to ambulate. Gait steady with walker. No c/o with walking today. Pt seems less SOB with walking today. RA sat during walk 91% after walk 90% RA. Pt back to recliner after walk with call light in reach. Completed CHF education with pt.and gave her CHF education packet. Got CHF video on for her to view.  Deon Pilling

## 2012-04-01 NOTE — Progress Notes (Signed)
UNABLE TO COMPLETE DISCHARGE SUMMARY IN APPROPRIATE TIME FRAME FOR  SNF.  WILL PLAN FOR DISCHARGE IN AM.

## 2012-04-02 ENCOUNTER — Encounter (HOSPITAL_COMMUNITY): Payer: Self-pay | Admitting: Cardiology

## 2012-04-02 DIAGNOSIS — M109 Gout, unspecified: Secondary | ICD-10-CM | POA: Diagnosis not present

## 2012-04-02 DIAGNOSIS — R262 Difficulty in walking, not elsewhere classified: Secondary | ICD-10-CM | POA: Diagnosis not present

## 2012-04-02 DIAGNOSIS — I10 Essential (primary) hypertension: Secondary | ICD-10-CM | POA: Diagnosis not present

## 2012-04-02 DIAGNOSIS — E119 Type 2 diabetes mellitus without complications: Secondary | ICD-10-CM | POA: Diagnosis not present

## 2012-04-02 DIAGNOSIS — R5381 Other malaise: Secondary | ICD-10-CM | POA: Diagnosis not present

## 2012-04-02 DIAGNOSIS — E876 Hypokalemia: Secondary | ICD-10-CM | POA: Diagnosis not present

## 2012-04-02 DIAGNOSIS — N189 Chronic kidney disease, unspecified: Secondary | ICD-10-CM | POA: Diagnosis not present

## 2012-04-02 DIAGNOSIS — D689 Coagulation defect, unspecified: Secondary | ICD-10-CM | POA: Diagnosis not present

## 2012-04-02 DIAGNOSIS — D649 Anemia, unspecified: Secondary | ICD-10-CM | POA: Diagnosis not present

## 2012-04-02 DIAGNOSIS — I509 Heart failure, unspecified: Secondary | ICD-10-CM | POA: Diagnosis not present

## 2012-04-02 DIAGNOSIS — J9600 Acute respiratory failure, unspecified whether with hypoxia or hypercapnia: Secondary | ICD-10-CM | POA: Diagnosis not present

## 2012-04-02 DIAGNOSIS — F411 Generalized anxiety disorder: Secondary | ICD-10-CM | POA: Diagnosis not present

## 2012-04-02 DIAGNOSIS — Z45018 Encounter for adjustment and management of other part of cardiac pacemaker: Secondary | ICD-10-CM | POA: Diagnosis not present

## 2012-04-02 DIAGNOSIS — J441 Chronic obstructive pulmonary disease with (acute) exacerbation: Secondary | ICD-10-CM | POA: Diagnosis not present

## 2012-04-02 DIAGNOSIS — I130 Hypertensive heart and chronic kidney disease with heart failure and stage 1 through stage 4 chronic kidney disease, or unspecified chronic kidney disease: Secondary | ICD-10-CM | POA: Diagnosis not present

## 2012-04-02 DIAGNOSIS — E785 Hyperlipidemia, unspecified: Secondary | ICD-10-CM | POA: Diagnosis not present

## 2012-04-02 DIAGNOSIS — G589 Mononeuropathy, unspecified: Secondary | ICD-10-CM | POA: Diagnosis not present

## 2012-04-02 DIAGNOSIS — R791 Abnormal coagulation profile: Secondary | ICD-10-CM | POA: Diagnosis not present

## 2012-04-02 DIAGNOSIS — R52 Pain, unspecified: Secondary | ICD-10-CM | POA: Diagnosis not present

## 2012-04-02 DIAGNOSIS — R5383 Other fatigue: Secondary | ICD-10-CM | POA: Diagnosis not present

## 2012-04-02 DIAGNOSIS — Z7901 Long term (current) use of anticoagulants: Secondary | ICD-10-CM | POA: Diagnosis not present

## 2012-04-02 DIAGNOSIS — E878 Other disorders of electrolyte and fluid balance, not elsewhere classified: Secondary | ICD-10-CM | POA: Diagnosis not present

## 2012-04-02 DIAGNOSIS — IMO0001 Reserved for inherently not codable concepts without codable children: Secondary | ICD-10-CM | POA: Diagnosis not present

## 2012-04-02 DIAGNOSIS — N181 Chronic kidney disease, stage 1: Secondary | ICD-10-CM | POA: Diagnosis not present

## 2012-04-02 DIAGNOSIS — I442 Atrioventricular block, complete: Secondary | ICD-10-CM | POA: Diagnosis not present

## 2012-04-02 DIAGNOSIS — Z79899 Other long term (current) drug therapy: Secondary | ICD-10-CM | POA: Diagnosis not present

## 2012-04-02 DIAGNOSIS — I359 Nonrheumatic aortic valve disorder, unspecified: Secondary | ICD-10-CM | POA: Diagnosis not present

## 2012-04-02 DIAGNOSIS — M79659 Pain in unspecified thigh: Secondary | ICD-10-CM

## 2012-04-02 DIAGNOSIS — M6281 Muscle weakness (generalized): Secondary | ICD-10-CM | POA: Diagnosis not present

## 2012-04-02 DIAGNOSIS — E039 Hypothyroidism, unspecified: Secondary | ICD-10-CM | POA: Diagnosis not present

## 2012-04-02 LAB — PROTIME-INR
INR: 2.2 — ABNORMAL HIGH (ref 0.00–1.49)
Prothrombin Time: 24.8 seconds — ABNORMAL HIGH (ref 11.6–15.2)

## 2012-04-02 LAB — BASIC METABOLIC PANEL
BUN: 39 mg/dL — ABNORMAL HIGH (ref 6–23)
CO2: 29 mEq/L (ref 19–32)
Calcium: 10 mg/dL (ref 8.4–10.5)
Chloride: 100 mEq/L (ref 96–112)
Creatinine, Ser: 2.01 mg/dL — ABNORMAL HIGH (ref 0.50–1.10)
GFR calc Af Amer: 26 mL/min — ABNORMAL LOW (ref >90)
GFR calc non Af Amer: 23 mL/min — ABNORMAL LOW (ref >90)
Glucose, Bld: 120 mg/dL — ABNORMAL HIGH (ref 70–99)
Potassium: 4.9 mEq/L (ref 3.5–5.1)
Sodium: 139 mEq/L (ref 135–145)

## 2012-04-02 LAB — CBC
HCT: 27 % — ABNORMAL LOW (ref 36.0–46.0)
Hemoglobin: 8.7 g/dL — ABNORMAL LOW (ref 12.0–15.0)
MCH: 29.6 pg (ref 26.0–34.0)
MCHC: 32.2 g/dL (ref 30.0–36.0)
MCV: 91.8 fL (ref 78.0–100.0)
Platelets: 234 10*3/uL (ref 150–400)
RBC: 2.94 MIL/uL — ABNORMAL LOW (ref 3.87–5.11)
RDW: 15.5 % (ref 11.5–15.5)
WBC: 12.4 10*3/uL — ABNORMAL HIGH (ref 4.0–10.5)

## 2012-04-02 LAB — GLUCOSE, CAPILLARY
Glucose-Capillary: 112 mg/dL — ABNORMAL HIGH (ref 70–99)
Glucose-Capillary: 144 mg/dL — ABNORMAL HIGH (ref 70–99)

## 2012-04-02 MED ORDER — ALPRAZOLAM 0.25 MG PO TABS: 0.2500 mg | ORAL_TABLET | Freq: Three times a day (TID) | ORAL | Status: AC | PRN

## 2012-04-02 MED ORDER — WARFARIN SODIUM 10 MG PO TABS
10.0000 mg | ORAL_TABLET | Freq: Once | ORAL | Status: DC
  Filled 2012-04-02: qty 1

## 2012-04-02 MED ORDER — TRAMADOL HCL 50 MG PO TABS: 50.0000 mg | ORAL_TABLET | Freq: Four times a day (QID) | ORAL | Status: AC | PRN

## 2012-04-02 NOTE — Progress Notes (Signed)
[id number] Cardiac Rehab Attempted to get pt to ambulate. She declines saying that she felt sick at her stomach this am and does ot feel like walking. Did give pt exercise guidelines and reviewed CHF symptoms and when to call MD

## 2012-04-02 NOTE — Progress Notes (Signed)
Clinical Social Worker facilitated discharge by contacting family and facility, [facility]. Patient's discharge packet will be placed in designated room number at nursing station. Patient will be transported by private vehicle of family.   Leandro Reasoner MSW, [place]

## 2012-04-02 NOTE — Progress Notes (Signed)
IV d/c'd.  Tele d/c'd.  Pt d/c'd to home.  Home meds and d/c instructions have been discussed with pt.  Pt denies any questions or concerns at this time.  Pt being d/c'd to SNF and is being transported via family.  Pt leaving the unit via wheelchair and appears in no acute distress. Gae Gallop RN

## 2012-04-02 NOTE — Progress Notes (Signed)
Subjective: Complains of Rt. Thigh pain from use of BSC  Objective: Vital signs in last 24 hours: Temp:  [98.4 F (36.9 C)-99.2 F (37.3 C)] 98.4 F (36.9 C) (07/09 0439) Pulse Rate:  [71-77] 73  (07/09 0439) Resp:  [18-20] 20  (07/09 0439) BP: (107-120)/(46-55) 110/46 mmHg (07/09 0439) SpO2:  [93 %-95 %] 93 % (07/09 0439) Weight:  [102.286 kg (225 lb 8 oz)] 102.286 kg (225 lb 8 oz) (07/09 0439) Weight change: -0.214 kg (-7.5 oz) Last BM Date: 04/01/12 Intake/Output from previous day:+253   Wt 102.2 down from 110.2 07/08 0701 - 07/09 0700 In: 903 [P.O.:900; I.V.:3] Out: 31 [Urine:650] Intake/Output this shift: Total I/O In: 56 [P.O.:60] Out: -   PE: General:alert and oriented X 3 Neck:no JVD Heart:S1S2 RRR + soft systolic murmur and click of valves Lungs:less rhonchi, otherwise clear Abd:+ BS soft non tender Ext:no edema, rt. Thigh with bruise just above bend of knee, + Pain in upper thigh.     Lab Results:  Basename 04/02/12 0515 04/01/12 0725  WBC 12.4* 10.4  HGB 8.7* 8.6*  HCT 27.0* 26.8*  PLT 234 245   BMET  Basename 04/02/12 0515 04/01/12 1558  NA 139 136  K 4.9 4.6  CL 100 96  CO2 29 30  GLUCOSE 120* 183*  BUN 39* 39*  CREATININE 2.01* 2.00*  CALCIUM 10.0 10.1   No results found for this basename: TROPONINI:2,CK,MB:2 in the last 72 hours  Lab Results  Component Value Date   CHOL 120 02/14/2012   HDL 29* 02/14/2012   LDLCALC 57 02/14/2012   TRIG 172* 02/14/2012   CHOLHDL 4.1 02/14/2012   Lab Results  Component Value Date   HGBA1C 6.6* 03/21/2012     Lab Results  Component Value Date   TSH 1.608 02/14/2012    EKG: Orders placed during the hospital encounter of 03/21/12  . EKG 12-LEAD  . EKG 12-LEAD  . EKG    Studies/Results: No results found.  Medications: I have reviewed the patient's current medications.    Marland Kitchen aspirin EC  81 mg Oral Daily  . digoxin  0.0625 mg Oral Daily  . ezetimibe-simvastatin  1 tablet Oral 3 times weekly  .  feeding supplement  1 Container Oral TID WC  . ferrous sulfate  325 mg Oral Q breakfast  . folic acid  1 mg Oral Daily  . furosemide  40 mg Oral BID  . insulin aspart  0-9 Units Subcutaneous TID WC  . isosorbide-hydrALAZINE  1 tablet Oral TID  . levothyroxine  75 mcg Oral QAC breakfast  . metoprolol succinate  125 mg Oral Daily  . pantoprazole  40 mg Oral Q1200  . potassium chloride SA  30 mEq Oral Daily  . sodium chloride  3 mL Intravenous Q12H  . warfarin  10 mg Oral ONCE-1800  . warfarin  10 mg Oral ONCE-1800  . Warfarin - Pharmacist Dosing Inpatient   Does not apply q1800  . DISCONTD: furosemide  40 mg Oral Daily   Assessment/Plan: Patient Active Problem List  Diagnosis  . HYPOTHYROIDISM  . DIABETES MELLITUS, TYPE II  . HYPERLIPIDEMIA  . DEHYDRATION  . OBESITY  . HYPERTENSION - with Cardiac Abnormalities  . Non-ischemic cardiomyopathy with ICD  . UNSPECIFIED CARDIOVASCULAR DISEASE  . VARICOSE VEINS LOWER EXTREMITIES W/OTH COMPS  . GERD  . CHOLELITHIASIS  . CKD (chronic kidney disease) stage 4, GFR 15-29 ml/min, now improved to CKD stage 3  . ACUTE CYSTITIS  . OTHER  SIGN AND SYMPTOM IN BREAST  . HIP PAIN, RIGHT  . DEGENERATIVE JOINT DISEASE, SPINE  . INSOMNIA  . FATIGUE  . NAUSEA  . NECK SPASM  . HEADACHE  . Allergic rhinitis  . Low back pain  . Iron deficiency  . Papilloma of breast  . Acute respiratory failure  . Warfarin-induced coagulopathy  . Cardiorenal syndrome with renal failure  . CAP (community acquired pneumonia)  . Acute on chronic systolic CHF (congestive heart failure)  . COPD exacerbation  . Elevated troponin  . S/P AVR (aortic valve replacement)  . S/P MVR (mitral valve replacement)  . AICD (automatic cardioverter/defibrillator) present  . CKD (chronic kidney disease), stage III - due to DM, HTN and cardiorenal syndrome  . Chronic anticoagulation, on coumadin for Mechanical AVR and MVR   PLAN:  Cr. Stable 1.9-2.0 We increased Lasix to BID  yesterday was not on lasix according to home med list.   INR 2.2, not quite to goal, but almost.  No further nose bleeds.   Using oxygen only at night.  No arb at D/C due to CKD stage 3-4.  Now on 125 on toprol. On dig 0.0625.    Plan for d/c to SNF for rehab. Today.  LOS: 12 days   INGOLD,LAURA R 04/02/2012, 8:27 AM   Agree with note written by Cecilie Kicks RNP  Diuresed. INR therapeutic. Exam benign. OK for D/C to SNF. ROV with Dr. Bebe Liter 04/02/2012 11:57 AM

## 2012-04-02 NOTE — Progress Notes (Signed)
ANTICOAGULATION CONSULT NOTE - Follow Up Consult  Pharmacy Consult for Coumadin Indication: mechanical AVR/MVR  Allergies  Allergen Reactions  . Niacin     REACTION: itching  . Penicillins     Labs:  Basename 04/02/12 0515 04/01/12 1558 04/01/12 0725 03/31/12 1124 03/31/12 0530  HGB 8.7* -- 8.6* -- --  HCT 27.0* -- 26.8* -- 27.2*  PLT 234 -- 245 -- 268  APTT -- -- -- -- --  LABPROT 24.8* 23.3* 23.4* -- --  INR 2.20* 2.03* 2.04* -- --  HEPARINUNFRC -- -- <0.10* 0.21* 0.65  CREATININE 2.01* 2.00* 1.95* -- --  CKTOTAL -- -- -- -- --  CKMB -- -- -- -- --  TROPONINI -- -- -- -- --    Estimated Creatinine Clearance: 28.4 ml/min (by C-G formula based on Cr of 2.01).  Assessment: 78 yr old female on coumadin for mechanical AVR/MVR. S/p Vitamin K 5 mg IV on 6/29 (when INR was 8.48).  CBC still decreased INR slowly trending back up  Goal of Therapy:  INR = 2.5-3.5 Monitor platelets by anticoagulation protocol: Yes   Plan:  Coumadin 10mg  PO x1 today If discharged today, recommend 7.5 mg daily with 5 mg on Friday Daily PT/INR.  Anette Guarneri, PharmD [phone]

## 2012-04-02 NOTE — Discharge Summary (Signed)
Physician Discharge Summary  Patient ID: [name] [name] MRN: [id number] DOB/AGE: 04-16-1932 78 y.o.  Admit date: 03/21/2012 Discharge date: 04/02/2012  Discharge Diagnoses:  Principal Problem:  *Acute respiratory failure Active Problems:  Non-ischemic cardiomyopathy with ICD  Warfarin-induced coagulopathy  Acute on chronic systolic CHF (congestive heart failure)  , with acute illness now resolved  DIABETES MELLITUS, TYPE II  HYPERTENSION - with Cardiac Abnormalities  CKD (chronic kidney disease) stage 4, GFR 15-29 ml/min, now improved to CKD stage 3  Iron deficiency  Cardiorenal syndrome with renal failure  CAP (community acquired pneumonia)  S/P AVR (aortic valve replacement)  S/P MVR (mitral valve replacement)  AICD (automatic cardioverter/defibrillator) present  HYPOTHYROIDISM  HYPERLIPIDEMIA  Upper leg pain, rt. from use of BSC?    COPD exacerbation  Elevated troponin  CKD (chronic kidney disease), stage III - due to DM, HTN and cardiorenal syndrome  Chronic anticoagulation, on coumadin for Mechanical AVR and MVR   Discharged Condition: fair  Hospital Course: 78 year-old female with known history of nonischemic cardiomyopathy last EF measured was in 2011 was 25%, history of pacemaker and ICD placement, history of mechanical mitral valve aortic valve on Coumadin, COPD, diabetes mellitus type 2 presented to the ER at [facility] with shortness of breath 2033-09-26. Patient has been experiencing shortness of breath over the  4 days before admit which was exertional and has been worsening. Had nonproductive cough and denied any chest pain associated with this. In the ER patient was found to be in respiratory failure and was placed on BiPAP. Chest x-rays were showing congestion and labs show increased BNP and also positive troponin. ER physician had contacted [facility] heart and vascular cardiologist and they have requested transfer to [facility] for further  management. When the patient was examined she was  on BiPAP mildly lethargic but not in distress. Patient was able to talk without discomfort and denies any chest pain. Does have some suprapubic pain it is a burning in sensation though denies any nausea vomiting or diarrhea. Specifically denies any chest pain. Patient was found to be febrile.  She was given IV Lasix 80 mg in the emergency room. Cardiac enzymes were cycled to the echo was ordered.  Patient also has a history of anemia following in [place] and received IV iron. Patient was nondiuretic prior to admission secondary to her noncompliance.  She was brought to [facility] and was seen by cardiology 03/22/2012.  Critical care medicine was also following for her respiratory Status. She was diagnosed with community-acquired pneumonia as well as her acute respiratory failure.  She also had acute on chronic systolic heart fired that there was some form of diastolic failure. She is also confused initially but this cleared fairly quickly.  During the ICU stay she developed hemoptysis and her Coumadin was reversed with vitamin K. She was placed on IV heparin for her aortic and Mitral mechanical valves. The hemoptysis was felt to be secondary to the pneumonia  In addition it was felt there was some COPD exacerbation included with her respiratory failure. She responded to steroids as well as nebulizers.  She did develop acute renal failure her losartan was DC'd.  As well as her Lanoxin.  We have restarted her Lanoxin at a low dose but But not losartan.  Patient was treated with antibiotics during hospitalization as well.  Patient continued to improve, thought was to use IV milrinone if no improvement with heart failure but she did respond to the diuretic.  She was eventually transferred to telemetry bed physical therapy worked with her she continued on IV heparin with Coumadin crossover. She did develop nosebleeds due to oxygen on she was weaned  down to back to her home nightly oxygen only.  Prior to discharge she did complain of right thigh pain she had a bruise from the best I can manage she felt her leg was painful because of the fact that she could walk better with a walker and a cane and Ultram was added for Pain control.   Her diuretic was adjusted towards the end of the hospitalization Due to elevated creatinine at times.  She was ready for discharge 04/02/2012 and will followup as an outpatient.  Consults: cardiology and pulmonary/intensive care  Significant Diagnostic Studies:  Labs at discharge sodium 139 potassium 4.9 chloride 100 CO2 29 BUN 39 creatinine 2.01 with a peak creatinine greater than 3 during hospitalization calcium 10 glucose 120    On admission creatinine was 3.01 with GFR 16 level IV chronic kidney disease  INR on admission was 8.48 Coumadin was held and then reverse when she developed hemoptysis at discharge 2.2   Pro BNP 1682 at discharge  Down from 29,898 on admission Procalcitonin on admission was 21.35  Blood cultures were negative Cardiac enzymes CK was elevated at 640 negative MB  Troponin mildly elevated at 0.58 Secondary to heart failure not MI  Hemoglobin on admission 9.8 hematocrit 29.5 WBC 11.3   At discharge hemoglobin 8.7 hematocrit 27 WBC 12.4 platelets 234  Discharge Exam: Blood pressure 110/46, pulse 73, temperature 98.4 F (36.9 C), temperature source Oral, resp. rate 20, height 5\' 7"  (1.702 m), weight 102.286 kg (225 lb 8 oz), SpO2 93.00%.   General:alert and oriented X 3  Neck:no JVD  Heart:S1S2 RRR + soft systolic murmur and click of valves  Lungs:less rhonchi, otherwise clear  Abd:+ BS soft non tender  Ext:no edema, rt. Thigh with bruise just above bend of knee, + Pain in upper thigh.   Disposition:   Discharge Orders    Future Appointments: Provider: Department: Dept Phone: Center:   06/17/2012 1:00 PM Fayrene Helper, [facility] [place]    09/11/2012 9:30 AM [place] [phone] None   09/13/2012 11:00 AM Pieter Partridge, [place] [phone] None     Future Orders Please Complete By Expires   For home use only DME oxygen      Comments:   oXYGEN 2 l [place] AT hs   Diet - low sodium heart healthy      Diet Carb Modified      Increase activity slowly      Discharge instructions      Heart Failure patients record your daily weight using the same scale at the same time of day      STOP any activity that causes chest pain, shortness of breath, dizziness, sweating, or exessive weakness      (HEART FAILURE PATIENTS) Call MD:  Anytime you have any of the following symptoms: 1) 3 pound weight gain in 24 hours or 5 pounds in 1 week 2) shortness of breath, with or without a dry hacking cough 3) swelling in the hands, feet or stomach 4) if you have to sleep on extra pillows at night in order to breathe.      PT eval and treat   04/02/13   Scheduling Instructions:   For SNF for rehab and will need PT   Contraindication to  ARB at discharge      Scheduling Instructions:   Acute renal failure resolving   Contraindication to ARB at discharge      Scheduling Instructions:   Acute renal failure resolving   Basic Metabolic Panel (BMET)      Comments:   Please do weekly and send to Dr. Rollene Fare, first 04/03/12   Questions: Responses:   Has the patient fasted?    INR/PT      Comments:   Please draw 04/03/12  INR goal is 2.5-3.5     Medication List  As of 04/02/2012  9:47 AM   STOP taking these medications         losartan 50 MG tablet         TAKE these medications         acetaminophen 325 MG tablet   Commonly known as: TYLENOL   Take 2 tablets (650 mg total) by mouth every 6 (six) hours as needed (or Fever >/= 101).      ADVAIR DISKUS 100-50 MCG/DOSE Aepb   Generic drug: Fluticasone-Salmeterol   inhale 1 dose by mouth twice a day as directed      allopurinol 100 MG tablet   Commonly known as:  ZYLOPRIM   Take 100 mg by mouth daily.      ALPRAZolam 0.25 MG tablet   Commonly known as: XANAX   Take 1 tablet (0.25 mg total) by mouth 3 (three) times daily as needed for anxiety.      aspirin 81 MG EC tablet   Take 1 tablet (81 mg total) by mouth daily.      CALCIUM 600 PO   Take 900 mg by mouth daily. Patient takes 1&1/2 tablet daily      DUONEB 0.5-2.5 (3) MG/3ML Soln   Generic drug: ipratropium-albuterol   USE 1 VIAL IN NEBULIZER 3 TIMES DAILY      ezetimibe-simvastatin 10-20 MG per tablet   Commonly known as: VYTORIN   Take 1 tablet by mouth 3 (three) times a week.      feeding supplement Pudg   Take 1 Container by mouth 3 (three) times daily with meals.      ferrous sulfate 325 (65 FE) MG tablet   Take 325 mg by mouth daily with breakfast.      folic acid 1 MG tablet   Commonly known as: FOLVITE   take 1 tablet by mouth once daily      furosemide 40 MG tablet   Commonly known as: LASIX   Take 1 tablet (40 mg total) by mouth 2 (two) times daily.      gabapentin 100 MG capsule   Commonly known as: NEURONTIN   Take 100-300 mg by mouth at bedtime. For leg pain and burning      glipiZIDE 2.5 MG 24 hr tablet   Commonly known as: GLUCOTROL XL   Take 2.5 mg by mouth daily.      glucose blood test strip   Use as instructed once daily with Freestyle Lite meter      HYDROcodone-acetaminophen 7.5-325 MG per tablet   Commonly known as: NORCO   Take 1 tablet by mouth every 4 (four) hours as needed. pain      isosorbide-hydrALAZINE 20-37.5 MG per tablet   Commonly known as: BIDIL   Take 1 tablet by mouth 3 (three) times daily.      LANOXIN 0.125 MG tablet   Generic drug: digoxin   Take 0.5 tablets (62.5 mcg total)  by mouth daily.      levothyroxine 75 MCG tablet   Commonly known as: SYNTHROID, LEVOTHROID   Take 1 tablet (75 mcg total) by mouth daily.      loratadine 10 MG dissolvable tablet   Commonly known as: CLARITIN REDITABS   Take 10 mg by mouth daily as  needed. allergies      metoprolol succinate 25 MG 24 hr tablet   Commonly known as: TOPROL-XL   Take 5 tablets (125 mg total) by mouth daily. Take with or immediately following a meal.      omeprazole 40 MG capsule   Commonly known as: PRILOSEC   Take 40 mg by mouth daily.      potassium chloride SA 20 MEQ tablet   Commonly known as: K-DUR,KLOR-CON   Take 30 mEq by mouth daily. 1 1/2 tablet daily      promethazine 25 MG tablet   Commonly known as: PHENERGAN   TAKE 1 TABLET BY MOUTH ONCE DAILY AS NEEDED      simethicone 80 MG chewable tablet   Commonly known as: MYLICON   Chew 1 tablet (80 mg total) by mouth every 6 (six) hours as needed for flatulence.      temazepam 30 MG capsule   Commonly known as: RESTORIL   TAKE 1 CAPSULE AT BEDTIME AS NEEDED FOR SLEEP      traMADol 50 MG tablet   Commonly known as: ULTRAM   Take 1 tablet (50 mg total) by mouth every 6 (six) hours as needed.      warfarin 5 MG tablet   Commonly known as: COUMADIN   Take 1 tablet (5 mg total) by mouth daily. Mondays and Friday 5 mg  Other days 7.5 mg           Follow-up Information    Follow up with Pixie Casino, MD on 04/10/2012. (in the [place] office  at  11:30  am  This is Dr. Lowella Fairy partner, Dr. Rollene Fare is on vacation.)    Contact information:   [place] [phone]        Discharge summary: Coumadin check --PT/INR  On Wednesday at nsg. Home.  Goal 2.5-3.5  Oxygen at HS, 2 L [place].  With humidity.  Call our office if weight greater than 3 pounds in a day or 5 pounds in a week.  2 gram sodium diet diabetic diet.   Weigh daily.   SignedIsaiah Serge 04/02/2012, 9:47 AM   Agree with note written by Cecilie Kicks RNP  Pt OK for D/C. Admitted for CHF probably secondary to not taking home lasix. AVR/MVR. INR therapeutic but low secondary to nose bleed. BUN/Scr at baseline. Diuresed. Breathing much better. Appears  euvolemic on exam. OK to D/C to SNF today, ROV already set up with Dr. Debara Pickett in [place] office next week.  Lorretta Harp 04/02/2012 10:08 AM

## 2012-04-12 DIAGNOSIS — Z45018 Encounter for adjustment and management of other part of cardiac pacemaker: Secondary | ICD-10-CM | POA: Diagnosis not present

## 2012-04-12 DIAGNOSIS — I359 Nonrheumatic aortic valve disorder, unspecified: Secondary | ICD-10-CM | POA: Diagnosis not present

## 2012-04-12 DIAGNOSIS — I442 Atrioventricular block, complete: Secondary | ICD-10-CM | POA: Diagnosis not present

## 2012-04-12 DIAGNOSIS — I509 Heart failure, unspecified: Secondary | ICD-10-CM | POA: Diagnosis not present

## 2012-04-18 ENCOUNTER — Other Ambulatory Visit: Payer: Self-pay | Admitting: Family Medicine

## 2012-04-26 DIAGNOSIS — N183 Chronic kidney disease, stage 3 unspecified: Secondary | ICD-10-CM | POA: Diagnosis not present

## 2012-04-26 DIAGNOSIS — E119 Type 2 diabetes mellitus without complications: Secondary | ICD-10-CM | POA: Diagnosis not present

## 2012-04-26 DIAGNOSIS — I129 Hypertensive chronic kidney disease with stage 1 through stage 4 chronic kidney disease, or unspecified chronic kidney disease: Secondary | ICD-10-CM | POA: Diagnosis not present

## 2012-04-26 DIAGNOSIS — I509 Heart failure, unspecified: Secondary | ICD-10-CM | POA: Diagnosis not present

## 2012-04-26 DIAGNOSIS — J441 Chronic obstructive pulmonary disease with (acute) exacerbation: Secondary | ICD-10-CM | POA: Diagnosis not present

## 2012-04-26 DIAGNOSIS — I428 Other cardiomyopathies: Secondary | ICD-10-CM | POA: Diagnosis not present

## 2012-04-29 ENCOUNTER — Telehealth: Payer: Self-pay | Admitting: Family Medicine

## 2012-04-29 DIAGNOSIS — N183 Chronic kidney disease, stage 3 unspecified: Secondary | ICD-10-CM | POA: Diagnosis not present

## 2012-04-29 DIAGNOSIS — I428 Other cardiomyopathies: Secondary | ICD-10-CM | POA: Diagnosis not present

## 2012-04-29 DIAGNOSIS — J441 Chronic obstructive pulmonary disease with (acute) exacerbation: Secondary | ICD-10-CM | POA: Diagnosis not present

## 2012-04-29 DIAGNOSIS — I509 Heart failure, unspecified: Secondary | ICD-10-CM | POA: Diagnosis not present

## 2012-04-29 DIAGNOSIS — E119 Type 2 diabetes mellitus without complications: Secondary | ICD-10-CM | POA: Diagnosis not present

## 2012-04-29 DIAGNOSIS — I129 Hypertensive chronic kidney disease with stage 1 through stage 4 chronic kidney disease, or unspecified chronic kidney disease: Secondary | ICD-10-CM | POA: Diagnosis not present

## 2012-04-29 MED ORDER — GLUCOSE BLOOD VI STRP: ORAL_STRIP | Status: DC

## 2012-04-29 NOTE — Telephone Encounter (Signed)
SENT IN AS REQUESTED

## 2012-04-30 DIAGNOSIS — N183 Chronic kidney disease, stage 3 unspecified: Secondary | ICD-10-CM | POA: Diagnosis not present

## 2012-04-30 DIAGNOSIS — J441 Chronic obstructive pulmonary disease with (acute) exacerbation: Secondary | ICD-10-CM | POA: Diagnosis not present

## 2012-04-30 DIAGNOSIS — I428 Other cardiomyopathies: Secondary | ICD-10-CM | POA: Diagnosis not present

## 2012-04-30 DIAGNOSIS — E119 Type 2 diabetes mellitus without complications: Secondary | ICD-10-CM | POA: Diagnosis not present

## 2012-04-30 DIAGNOSIS — I129 Hypertensive chronic kidney disease with stage 1 through stage 4 chronic kidney disease, or unspecified chronic kidney disease: Secondary | ICD-10-CM | POA: Diagnosis not present

## 2012-04-30 DIAGNOSIS — I509 Heart failure, unspecified: Secondary | ICD-10-CM | POA: Diagnosis not present

## 2012-05-01 DIAGNOSIS — J441 Chronic obstructive pulmonary disease with (acute) exacerbation: Secondary | ICD-10-CM | POA: Diagnosis not present

## 2012-05-01 DIAGNOSIS — I509 Heart failure, unspecified: Secondary | ICD-10-CM | POA: Diagnosis not present

## 2012-05-01 DIAGNOSIS — I428 Other cardiomyopathies: Secondary | ICD-10-CM | POA: Diagnosis not present

## 2012-05-01 DIAGNOSIS — E119 Type 2 diabetes mellitus without complications: Secondary | ICD-10-CM | POA: Diagnosis not present

## 2012-05-01 DIAGNOSIS — N183 Chronic kidney disease, stage 3 unspecified: Secondary | ICD-10-CM | POA: Diagnosis not present

## 2012-05-01 DIAGNOSIS — I129 Hypertensive chronic kidney disease with stage 1 through stage 4 chronic kidney disease, or unspecified chronic kidney disease: Secondary | ICD-10-CM | POA: Diagnosis not present

## 2012-05-02 DIAGNOSIS — N183 Chronic kidney disease, stage 3 unspecified: Secondary | ICD-10-CM | POA: Diagnosis not present

## 2012-05-02 DIAGNOSIS — I428 Other cardiomyopathies: Secondary | ICD-10-CM | POA: Diagnosis not present

## 2012-05-02 DIAGNOSIS — J441 Chronic obstructive pulmonary disease with (acute) exacerbation: Secondary | ICD-10-CM | POA: Diagnosis not present

## 2012-05-02 DIAGNOSIS — I509 Heart failure, unspecified: Secondary | ICD-10-CM | POA: Diagnosis not present

## 2012-05-02 DIAGNOSIS — I129 Hypertensive chronic kidney disease with stage 1 through stage 4 chronic kidney disease, or unspecified chronic kidney disease: Secondary | ICD-10-CM | POA: Diagnosis not present

## 2012-05-02 DIAGNOSIS — E119 Type 2 diabetes mellitus without complications: Secondary | ICD-10-CM | POA: Diagnosis not present

## 2012-05-03 DIAGNOSIS — I129 Hypertensive chronic kidney disease with stage 1 through stage 4 chronic kidney disease, or unspecified chronic kidney disease: Secondary | ICD-10-CM | POA: Diagnosis not present

## 2012-05-03 DIAGNOSIS — J441 Chronic obstructive pulmonary disease with (acute) exacerbation: Secondary | ICD-10-CM | POA: Diagnosis not present

## 2012-05-03 DIAGNOSIS — I509 Heart failure, unspecified: Secondary | ICD-10-CM | POA: Diagnosis not present

## 2012-05-03 DIAGNOSIS — E119 Type 2 diabetes mellitus without complications: Secondary | ICD-10-CM | POA: Diagnosis not present

## 2012-05-03 DIAGNOSIS — N183 Chronic kidney disease, stage 3 unspecified: Secondary | ICD-10-CM | POA: Diagnosis not present

## 2012-05-03 DIAGNOSIS — I428 Other cardiomyopathies: Secondary | ICD-10-CM | POA: Diagnosis not present

## 2012-05-06 DIAGNOSIS — Z7901 Long term (current) use of anticoagulants: Secondary | ICD-10-CM | POA: Diagnosis not present

## 2012-05-06 DIAGNOSIS — N183 Chronic kidney disease, stage 3 unspecified: Secondary | ICD-10-CM | POA: Diagnosis not present

## 2012-05-06 DIAGNOSIS — I129 Hypertensive chronic kidney disease with stage 1 through stage 4 chronic kidney disease, or unspecified chronic kidney disease: Secondary | ICD-10-CM | POA: Diagnosis not present

## 2012-05-06 DIAGNOSIS — E119 Type 2 diabetes mellitus without complications: Secondary | ICD-10-CM | POA: Diagnosis not present

## 2012-05-06 DIAGNOSIS — J441 Chronic obstructive pulmonary disease with (acute) exacerbation: Secondary | ICD-10-CM | POA: Diagnosis not present

## 2012-05-06 DIAGNOSIS — I509 Heart failure, unspecified: Secondary | ICD-10-CM | POA: Diagnosis not present

## 2012-05-06 DIAGNOSIS — J449 Chronic obstructive pulmonary disease, unspecified: Secondary | ICD-10-CM | POA: Diagnosis not present

## 2012-05-06 DIAGNOSIS — I428 Other cardiomyopathies: Secondary | ICD-10-CM | POA: Diagnosis not present

## 2012-05-07 DIAGNOSIS — N183 Chronic kidney disease, stage 3 unspecified: Secondary | ICD-10-CM | POA: Diagnosis not present

## 2012-05-07 DIAGNOSIS — I509 Heart failure, unspecified: Secondary | ICD-10-CM | POA: Diagnosis not present

## 2012-05-07 DIAGNOSIS — E119 Type 2 diabetes mellitus without complications: Secondary | ICD-10-CM | POA: Diagnosis not present

## 2012-05-07 DIAGNOSIS — I428 Other cardiomyopathies: Secondary | ICD-10-CM | POA: Diagnosis not present

## 2012-05-07 DIAGNOSIS — J441 Chronic obstructive pulmonary disease with (acute) exacerbation: Secondary | ICD-10-CM | POA: Diagnosis not present

## 2012-05-07 DIAGNOSIS — I129 Hypertensive chronic kidney disease with stage 1 through stage 4 chronic kidney disease, or unspecified chronic kidney disease: Secondary | ICD-10-CM | POA: Diagnosis not present

## 2012-05-09 ENCOUNTER — Encounter: Payer: Self-pay | Admitting: Internal Medicine

## 2012-05-09 DIAGNOSIS — I129 Hypertensive chronic kidney disease with stage 1 through stage 4 chronic kidney disease, or unspecified chronic kidney disease: Secondary | ICD-10-CM | POA: Diagnosis not present

## 2012-05-09 DIAGNOSIS — I428 Other cardiomyopathies: Secondary | ICD-10-CM | POA: Diagnosis not present

## 2012-05-09 DIAGNOSIS — N183 Chronic kidney disease, stage 3 unspecified: Secondary | ICD-10-CM | POA: Diagnosis not present

## 2012-05-09 DIAGNOSIS — J441 Chronic obstructive pulmonary disease with (acute) exacerbation: Secondary | ICD-10-CM | POA: Diagnosis not present

## 2012-05-09 DIAGNOSIS — E119 Type 2 diabetes mellitus without complications: Secondary | ICD-10-CM | POA: Diagnosis not present

## 2012-05-09 DIAGNOSIS — I509 Heart failure, unspecified: Secondary | ICD-10-CM | POA: Diagnosis not present

## 2012-05-13 DIAGNOSIS — I428 Other cardiomyopathies: Secondary | ICD-10-CM | POA: Diagnosis not present

## 2012-05-13 DIAGNOSIS — J441 Chronic obstructive pulmonary disease with (acute) exacerbation: Secondary | ICD-10-CM | POA: Diagnosis not present

## 2012-05-13 DIAGNOSIS — E119 Type 2 diabetes mellitus without complications: Secondary | ICD-10-CM | POA: Diagnosis not present

## 2012-05-13 DIAGNOSIS — I129 Hypertensive chronic kidney disease with stage 1 through stage 4 chronic kidney disease, or unspecified chronic kidney disease: Secondary | ICD-10-CM | POA: Diagnosis not present

## 2012-05-13 DIAGNOSIS — N183 Chronic kidney disease, stage 3 unspecified: Secondary | ICD-10-CM | POA: Diagnosis not present

## 2012-05-13 DIAGNOSIS — I509 Heart failure, unspecified: Secondary | ICD-10-CM | POA: Diagnosis not present

## 2012-05-14 DIAGNOSIS — E119 Type 2 diabetes mellitus without complications: Secondary | ICD-10-CM | POA: Diagnosis not present

## 2012-05-14 DIAGNOSIS — J441 Chronic obstructive pulmonary disease with (acute) exacerbation: Secondary | ICD-10-CM | POA: Diagnosis not present

## 2012-05-14 DIAGNOSIS — I509 Heart failure, unspecified: Secondary | ICD-10-CM | POA: Diagnosis not present

## 2012-05-15 DIAGNOSIS — J441 Chronic obstructive pulmonary disease with (acute) exacerbation: Secondary | ICD-10-CM | POA: Diagnosis not present

## 2012-05-15 DIAGNOSIS — I428 Other cardiomyopathies: Secondary | ICD-10-CM | POA: Diagnosis not present

## 2012-05-15 DIAGNOSIS — I509 Heart failure, unspecified: Secondary | ICD-10-CM | POA: Diagnosis not present

## 2012-05-15 DIAGNOSIS — E119 Type 2 diabetes mellitus without complications: Secondary | ICD-10-CM | POA: Diagnosis not present

## 2012-05-15 DIAGNOSIS — I129 Hypertensive chronic kidney disease with stage 1 through stage 4 chronic kidney disease, or unspecified chronic kidney disease: Secondary | ICD-10-CM | POA: Diagnosis not present

## 2012-05-15 DIAGNOSIS — N183 Chronic kidney disease, stage 3 unspecified: Secondary | ICD-10-CM | POA: Diagnosis not present

## 2012-05-17 ENCOUNTER — Encounter: Payer: Self-pay | Admitting: Family Medicine

## 2012-05-17 ENCOUNTER — Ambulatory Visit (INDEPENDENT_AMBULATORY_CARE_PROVIDER_SITE_OTHER): Payer: Medicare Other | Admitting: Family Medicine

## 2012-05-17 VITALS — BP 112/58 | HR 91 | Resp 16 | Ht 67.0 in | Wt 228.0 lb

## 2012-05-17 DIAGNOSIS — I5022 Chronic systolic (congestive) heart failure: Secondary | ICD-10-CM | POA: Diagnosis not present

## 2012-05-17 DIAGNOSIS — J449 Chronic obstructive pulmonary disease, unspecified: Secondary | ICD-10-CM

## 2012-05-17 DIAGNOSIS — E119 Type 2 diabetes mellitus without complications: Secondary | ICD-10-CM | POA: Diagnosis not present

## 2012-05-17 DIAGNOSIS — I10 Essential (primary) hypertension: Secondary | ICD-10-CM

## 2012-05-17 DIAGNOSIS — E611 Iron deficiency: Secondary | ICD-10-CM

## 2012-05-17 DIAGNOSIS — M79651 Pain in right thigh: Secondary | ICD-10-CM

## 2012-05-17 DIAGNOSIS — N184 Chronic kidney disease, stage 4 (severe): Secondary | ICD-10-CM | POA: Diagnosis not present

## 2012-05-17 DIAGNOSIS — M79609 Pain in unspecified limb: Secondary | ICD-10-CM

## 2012-05-17 DIAGNOSIS — E039 Hypothyroidism, unspecified: Secondary | ICD-10-CM | POA: Diagnosis not present

## 2012-05-17 DIAGNOSIS — D509 Iron deficiency anemia, unspecified: Secondary | ICD-10-CM

## 2012-05-17 LAB — BASIC METABOLIC PANEL
BUN: 50 mg/dL — ABNORMAL HIGH (ref 6–23)
CO2: 25 mEq/L (ref 19–32)
Calcium: 9.4 mg/dL (ref 8.4–10.5)
Chloride: 100 mEq/L (ref 96–112)
Creat: 2.46 mg/dL — ABNORMAL HIGH (ref 0.50–1.10)
Glucose, Bld: 110 mg/dL — ABNORMAL HIGH (ref 70–99)
Potassium: 4.9 mEq/L (ref 3.5–5.3)
Sodium: 138 mEq/L (ref 135–145)

## 2012-05-17 LAB — CBC
HCT: 28.4 % — ABNORMAL LOW (ref 36.0–46.0)
Hemoglobin: 9.3 g/dL — ABNORMAL LOW (ref 12.0–15.0)
MCH: 28.5 pg (ref 26.0–34.0)
MCHC: 32.7 g/dL (ref 30.0–36.0)
MCV: 87.1 fL (ref 78.0–100.0)
Platelets: 285 10*3/uL (ref 150–400)
RBC: 3.26 MIL/uL — ABNORMAL LOW (ref 3.87–5.11)
RDW: 15.9 % — ABNORMAL HIGH (ref 11.5–15.5)
WBC: 7.5 10*3/uL (ref 4.0–10.5)

## 2012-05-17 LAB — HEMOGLOBIN A1C
Hgb A1c MFr Bld: 6.2 % — ABNORMAL HIGH (ref <5.7)
Mean Plasma Glucose: 131 mg/dL — ABNORMAL HIGH (ref <117)

## 2012-05-17 LAB — TSH: TSH: 1.363 u[IU]/mL (ref 0.350–4.500)

## 2012-05-17 NOTE — Patient Instructions (Signed)
Get the labs done - we will fax to Dr. Rollene Fare and Dr. Hinda Lenis  Continue current medications Try your pain medication for your leg  / heating pad  If you leg pain does not get better by next week call  Keep previous f/u appt

## 2012-05-19 ENCOUNTER — Encounter: Payer: Self-pay | Admitting: Family Medicine

## 2012-05-19 DIAGNOSIS — J449 Chronic obstructive pulmonary disease, unspecified: Secondary | ICD-10-CM | POA: Insufficient documentation

## 2012-05-19 DIAGNOSIS — I5033 Acute on chronic diastolic (congestive) heart failure: Secondary | ICD-10-CM | POA: Insufficient documentation

## 2012-05-19 DIAGNOSIS — M79651 Pain in right thigh: Secondary | ICD-10-CM | POA: Insufficient documentation

## 2012-05-19 DIAGNOSIS — J209 Acute bronchitis, unspecified: Secondary | ICD-10-CM | POA: Insufficient documentation

## 2012-05-19 HISTORY — DX: Acute on chronic diastolic (congestive) heart failure: I50.33

## 2012-05-19 NOTE — Assessment & Plan Note (Signed)
BP well controlled today.

## 2012-05-19 NOTE — Assessment & Plan Note (Signed)
Unclear cause, no swelling noted, no mass felt, on coumadin therapy, possible referred from hip, no cause could be found during admission either. She does not want any imaging at this time, we will watch this

## 2012-05-19 NOTE — Assessment & Plan Note (Signed)
F/u with nephrology, labs to be done and will be faxed to Dr. Hinda Lenis

## 2012-05-19 NOTE — Assessment & Plan Note (Signed)
F/u with cardiology established, currently euvolemic

## 2012-05-19 NOTE — Assessment & Plan Note (Signed)
Followed by hematology, with recent events, recheck Hb to ensure returned to baseline

## 2012-05-19 NOTE — Progress Notes (Signed)
  Subjective:    Patient ID: [name] [name], female    DOB: 03-Sep-1932, 78 y.o.   MRN: [id number]  HPI  Pt here for hospital follow-up. Admitted for acute respiratory failure with COPD, CAP and CHF 6/27- 7/9, she then spent 3 weeks in [facility], now has [facility] and home PT. She had a complicated hospitalization with hemoptysis secondary to supratheraputic INR in setting of respiratory distress- she did not require intubation, acute on chronic renal failure and deconditioning.  She has follow-up with her nephrologist and Cardiologist in the next few weeks She is also being seen by hematology for ongoing anemia She has had a mild headache today, but is not concerned about this. She has pain in inner right thigh and felt it was swollen, she had pain in this leg during inpatient but no diagnosis found, she has not felt any mass or nodule but it is sore to touch.  DM- A1C 6.6%, fasting CBG have been in 90's Her breathing has been very good since she left the hospital     Review of Systems - per above GEN- denies fatigue, fever, weight loss,weakness, recent illness HEENT- denies eye drainage, change in vision, nasal discharge, CVS- denies chest pain, palpitations RESP- denies SOB, cough, wheeze ABD- denies N/V, change in stools, abd pain GU- denies dysuria, hematuria, dribbling, incontinence MSK- + joint pain, muscle aches, injury Neuro- + headache, dizziness, syncope, seizure activity      Objective:   Physical Exam GEN- NAD, alert and oriented x3, well appearing, obese HEENT- PERRL, EOMI, non injected sclera, pink conjunctiva, MMM, oropharynx clear Neck- Supple,  CVS- RRR, no murmur RESP-CTAB ABD-NABS,soft,ND,ND EXT- No edema, inner right thigh, no mass felt, no bruising, mild TTP, no pheblitis seen, no swelling seen Neuro- CNII-XII in tact, no focal deficits, walks with cane Pulses- Radial, DP- 2+        Assessment & Plan:

## 2012-05-19 NOTE — Assessment & Plan Note (Signed)
Currently stable, no change to meds 

## 2012-05-20 ENCOUNTER — Emergency Department (HOSPITAL_COMMUNITY): Payer: Medicare Other

## 2012-05-20 ENCOUNTER — Emergency Department (HOSPITAL_COMMUNITY)
Admission: EM | Admit: 2012-05-20 | Discharge: 2012-05-20 | Disposition: A | Discharge status: Home / Self Care | Payer: Medicare Other | Attending: Emergency Medicine | Admitting: Emergency Medicine

## 2012-05-20 ENCOUNTER — Encounter: Payer: Self-pay | Admitting: Internal Medicine

## 2012-05-20 DIAGNOSIS — E785 Hyperlipidemia, unspecified: Secondary | ICD-10-CM | POA: Insufficient documentation

## 2012-05-20 DIAGNOSIS — M79651 Pain in right thigh: Secondary | ICD-10-CM

## 2012-05-20 DIAGNOSIS — I509 Heart failure, unspecified: Secondary | ICD-10-CM | POA: Diagnosis not present

## 2012-05-20 DIAGNOSIS — M7989 Other specified soft tissue disorders: Secondary | ICD-10-CM | POA: Diagnosis not present

## 2012-05-20 DIAGNOSIS — N181 Chronic kidney disease, stage 1: Secondary | ICD-10-CM | POA: Diagnosis not present

## 2012-05-20 DIAGNOSIS — D509 Iron deficiency anemia, unspecified: Secondary | ICD-10-CM | POA: Insufficient documentation

## 2012-05-20 DIAGNOSIS — M79609 Pain in unspecified limb: Secondary | ICD-10-CM | POA: Diagnosis not present

## 2012-05-20 DIAGNOSIS — E039 Hypothyroidism, unspecified: Secondary | ICD-10-CM | POA: Diagnosis not present

## 2012-05-20 DIAGNOSIS — E109 Type 1 diabetes mellitus without complications: Secondary | ICD-10-CM | POA: Diagnosis not present

## 2012-05-20 DIAGNOSIS — M47817 Spondylosis without myelopathy or radiculopathy, lumbosacral region: Secondary | ICD-10-CM | POA: Insufficient documentation

## 2012-05-20 DIAGNOSIS — Z87891 Personal history of nicotine dependence: Secondary | ICD-10-CM | POA: Insufficient documentation

## 2012-05-20 DIAGNOSIS — J449 Chronic obstructive pulmonary disease, unspecified: Secondary | ICD-10-CM | POA: Diagnosis not present

## 2012-05-20 DIAGNOSIS — G4700 Insomnia, unspecified: Secondary | ICD-10-CM | POA: Diagnosis not present

## 2012-05-20 DIAGNOSIS — Z7901 Long term (current) use of anticoagulants: Secondary | ICD-10-CM | POA: Insufficient documentation

## 2012-05-20 DIAGNOSIS — E669 Obesity, unspecified: Secondary | ICD-10-CM | POA: Diagnosis not present

## 2012-05-20 DIAGNOSIS — K219 Gastro-esophageal reflux disease without esophagitis: Secondary | ICD-10-CM | POA: Diagnosis not present

## 2012-05-20 DIAGNOSIS — Z95810 Presence of automatic (implantable) cardiac defibrillator: Secondary | ICD-10-CM | POA: Diagnosis not present

## 2012-05-20 DIAGNOSIS — I129 Hypertensive chronic kidney disease with stage 1 through stage 4 chronic kidney disease, or unspecified chronic kidney disease: Secondary | ICD-10-CM | POA: Diagnosis not present

## 2012-05-20 DIAGNOSIS — J4489 Other specified chronic obstructive pulmonary disease: Secondary | ICD-10-CM | POA: Insufficient documentation

## 2012-05-20 DIAGNOSIS — M25559 Pain in unspecified hip: Secondary | ICD-10-CM | POA: Diagnosis not present

## 2012-05-20 MED ORDER — PREDNISONE 10 MG PO TABS: 20.0000 mg | ORAL_TABLET | Freq: Two times a day (BID) | ORAL | Status: DC

## 2012-05-20 NOTE — ED Notes (Signed)
Pt c/o right inner thigh pain since Rehab on Thursday, receiving therapy due to an admission for CHF

## 2012-05-20 NOTE — ED Notes (Signed)
[place] staff in room performing ultrasound.

## 2012-05-20 NOTE — ED Notes (Signed)
Drink given upon request.

## 2012-05-20 NOTE — ED Provider Notes (Signed)
History  This chart was scribed for Veryl Speak, MD by Roe Coombs. The patient was seen in room APA03/APA03. Patient's care was started at 1343.     CSN: [id number]  Arrival date & time 05/20/12  1343   First MD Initiated Contact with Patient 05/20/12 1414      Chief Complaint  Patient presents with  . Leg Pain    The history is provided by the patient. No language interpreter was used.   [name] [name] is a 78 y.o. female who presents to the Emergency Department complaining of moderate, inner right thigh pain onset 4 days ago. She denies any obvious injury to her leg. Patient says that bearing weight and movement worsen the pain. Patient is ambulatory, but uses a cane to walk. She was seen by her PMD on Friday who sent her home with prescriptions for pain medication and instructions to seek additional care if pain persisted. Patient denies chest pain or SOB. She has a medical history of COPD, heart murmur, ICD, varicose veins, DJD of lumbar spine, chronic kidney disease and esophageal reflux.  Past Medical History  Diagnosis Date  . DJD (degenerative joint disease) of lumbar spine   . Chronic kidney disease, stage I   . Esophageal reflux   . Other and unspecified hyperlipidemia   . Other primary cardiomyopathies   . Obesity, unspecified   . Unspecified hypothyroidism   . Type I (juvenile type) diabetes mellitus without mention of complication, not stated as uncontrolled   . Essential hypertension, benign   . 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 Pain in joint, pelvic region and thigh   . Insomnia, unspecified   . Iron deficiency 10/04/2011  . Anemia   . Cancer   . CHF (congestive heart failure)   . COPD (chronic obstructive pulmonary disease)   . Heart murmur   . ICD (implantable cardiac defibrillator) in place   . , with acute illness now resolved 04/02/2012    Past Surgical History  Procedure Date  . Thyroidectomy   . Pacemaker placed 2004    . Aortic and mitral valve replacement 2001  . Right breast cyst removed     benign   . Right cataract removed     2005  . Defibrillator implanted 2006   . Cardiac valve replacement   . Cardiac catheterization   . Insert / replace / remove pacemaker   . Esophagogastroduodenoscopy 06/12/2007     normal esophagus, small hiatal hernia, otherwise normal stomach, D1, D2  . Colonoscopy 06/12/2007     Normal rectum/Normal colon  . Colonoscopy 12/16/02    small hemorrhoids    Family History  Problem Relation Age of Onset  . Cancer Mother     behind pancres  . Heart disease Father   . Heart disease Sister     Heart attack  . Heart disease Brother   . Diabetes Brother     History  Substance Use Topics  . Smoking status: Former Smoker    Quit date: 03/08/1987  . Smokeless tobacco: Not on file  . Alcohol Use: No    OB History    Grav Para Term Preterm Abortions TAB SAB Ect Mult Living                  Review of Systems  Respiratory: Negative for shortness of breath.   Cardiovascular: Negative for chest pain.  Musculoskeletal:       Positive for leg pain.  All other systems reviewed and are negative.    Allergies  Niacin and Penicillins  Home Medications   Current Outpatient Rx  Name Route Sig Dispense Refill  . ACETAMINOPHEN 325 MG PO TABS Oral Take 2 tablets (650 mg total) by mouth every 6 (six) hours as needed (or Fever >/= 101).    . ADVAIR DISKUS 100-50 MCG/DOSE IN AEPB  inhale 1 dose by mouth twice a day as directed 60 each 4    Refill Approved  . ALLOPURINOL 100 MG PO TABS Oral Take 100 mg by mouth daily.    . ASPIRIN 81 MG PO TBEC Oral Take 1 tablet (81 mg total) by mouth daily.    Marland Kitchen CALCIUM 600 PO Oral Take 900 mg by mouth daily. Patient takes 1&1/2 tablet daily    . DUONEB 0.5-2.5 (3) MG/3ML IN SOLN  USE 1 VIAL IN NEBULIZER 3 TIMES DAILY 3 mL 270  . EZETIMIBE-SIMVASTATIN 10-20 MG PO TABS Oral Take 1 tablet by mouth 3 (three) times a week.     . ENSURE  PUDDING PO PUDG Oral Take 1 Container by mouth 3 (three) times daily with meals.    Lyndle Herrlich SULFATE 325 (65 FE) MG PO TABS Oral Take 325 mg by mouth daily with breakfast.     . FOLIC ACID 1 MG PO TABS  take 1 tablet by mouth once daily 100 tablet 6  . FUROSEMIDE 40 MG PO TABS Oral Take 1 tablet (40 mg total) by mouth 2 (two) times daily. 60 tablet 11  . GABAPENTIN 100 MG PO CAPS Oral Take 100-300 mg by mouth at bedtime. For leg pain and burning    . GLIPIZIDE XL 2.5 MG PO TB24  TAKE 1 TABLET BY MOUTH ONCE DAILY 90 tablet 1  . GLUCOSE BLOOD VI STRP  Use as instructed once daily with Freestyle Lite meter 50 each 11  . HYDROCODONE-ACETAMINOPHEN 7.5-325 MG PO TABS Oral Take 1 tablet by mouth every 4 (four) hours as needed. pain    . ISOSORB DINITRATE-HYDRALAZINE 20-37.5 MG PO TABS Oral Take 1 tablet by mouth 3 (three) times daily. 270 tablet 2  . LANOXIN 0.125 MG PO TABS Oral Take 0.5 tablets (62.5 mcg total) by mouth daily. 90 tablet 1    Dispense as written.  Marland Kitchen LEVOTHYROXINE SODIUM 75 MCG PO TABS Oral Take 1 tablet (75 mcg total) by mouth daily. 90 tablet 3  . LORATADINE 10 MG PO TBDP Oral Take 10 mg by mouth daily as needed. allergies    . METOPROLOL SUCCINATE ER 25 MG PO TB24 Oral Take 5 tablets (125 mg total) by mouth daily. Take with or immediately following a meal. 150 tablet 10  . OMEPRAZOLE 40 MG PO CPDR Oral Take 40 mg by mouth daily.      Marland Kitchen POTASSIUM CHLORIDE CRYS ER 20 MEQ PO TBCR Oral Take 30 mEq by mouth daily. 1 1/2 tablet daily    . PROMETHAZINE HCL 25 MG PO TABS  TAKE 1 TABLET BY MOUTH ONCE DAILY AS NEEDED 90 tablet 1  . SIMETHICONE 80 MG PO CHEW Oral Chew 1 tablet (80 mg total) by mouth every 6 (six) hours as needed for flatulence. 30 tablet   . TEMAZEPAM 30 MG PO CAPS  TAKE 1 CAPSULE AT BEDTIME AS NEEDED FOR SLEEP 30 capsule 3  . WARFARIN SODIUM 5 MG PO TABS Oral Take 1 tablet (5 mg total) by mouth daily. Mondays and Friday 5 mg Other days 7.5 mg 90 tablet  0    BP 118/46   Pulse 72  Temp 98.3 F (36.8 C) (Oral)  Resp 20  Ht 5\' 7"  (1.702 m)  Wt 224 lb (101.606 kg)  BMI 35.08 kg/m2  SpO2 100%  Physical Exam  Constitutional: She is oriented to person, place, and time. She appears well-developed and well-nourished.  HENT:  Head: Normocephalic and atraumatic.  Pulmonary/Chest: Effort normal.  Musculoskeletal: Normal range of motion. She exhibits no edema.       Right upper leg: She exhibits tenderness. She exhibits no swelling and no edema.       Tender to palpation on inside of right thigh. Unable to palpate any obvious abnormalities. No edema or swelling. Strength intact distally.   Neurological: She is alert and oriented to person, place, and time.  Skin: Skin is warm and dry.  Psychiatric: She has a normal mood and affect. Her behavior is normal.    ED Course  Procedures (including critical care time) DIAGNOSTIC STUDIES: Oxygen Saturation is 100% on room air, normal by my interpretation.    COORDINATION OF CARE: 2:18pm- Patient informed of current plan for treatment and evaluation and agrees with plan at this time. Will take [place] of right leg.  3:37pm- Advised patient on [place] results, shows no evidence of a blood clot or thrombocytosis. Will discharge patient with prescription for an anti-inflammatory with instructions to follow up if pain persists.  US Venous Img Lower Unilateral Right  05/20/2012  *RADIOLOGY REPORT*  Clinical Data: Right leg pain and swelling  RIGHT LOWER EXTREMITY VENOUS DUPLEX ULTRASOUND  Technique:  Gray-scale sonography with graded compression, as well as color Doppler and duplex ultrasound, were performed to evaluate the deep venous system of the lower extremity from the level of the common femoral vein through the popliteal and proximal calf veins. Spectral Doppler was utilized to evaluate flow at rest and with distal augmentation maneuvers.  Comparison:  None  Findings: Deep venous system patent and compressible from right groin  through popliteal fossa. Spontaneous venous flow present with intact augmentation and evidence of respiratory phasicity. No intraluminal thrombus identified. Visualized portion of the greater saphenous system unremarkable.  IMPRESSION: No evidence of deep venous thrombosis in the right lower extremity.   Original Report Authenticated By: Burnetta Sabin, M.D.      No diagnosis found.    MDM  The patient presents with pain to the inside of the thigh for the past several days without injury or trauma.  The exam does not reveal any obvious abnormalities.  The [place] does not show dvt.  I suspect that this is musculoskeletal and will treat with prednisone, continued hydrocodone that she already has.  To follow up if not improving.      I personally performed the services described in this documentation, which was scribed in my presence. The recorded information has been reviewed and considered.      Veryl Speak, MD 05/20/12 [phone]

## 2012-05-21 ENCOUNTER — Ambulatory Visit: Payer: Medicare Other | Admitting: Gastroenterology

## 2012-05-21 DIAGNOSIS — N183 Chronic kidney disease, stage 3 unspecified: Secondary | ICD-10-CM | POA: Diagnosis not present

## 2012-05-21 DIAGNOSIS — I428 Other cardiomyopathies: Secondary | ICD-10-CM | POA: Diagnosis not present

## 2012-05-21 DIAGNOSIS — E119 Type 2 diabetes mellitus without complications: Secondary | ICD-10-CM | POA: Diagnosis not present

## 2012-05-21 DIAGNOSIS — I509 Heart failure, unspecified: Secondary | ICD-10-CM | POA: Diagnosis not present

## 2012-05-21 DIAGNOSIS — I129 Hypertensive chronic kidney disease with stage 1 through stage 4 chronic kidney disease, or unspecified chronic kidney disease: Secondary | ICD-10-CM | POA: Diagnosis not present

## 2012-05-21 DIAGNOSIS — J441 Chronic obstructive pulmonary disease with (acute) exacerbation: Secondary | ICD-10-CM | POA: Diagnosis not present

## 2012-05-22 ENCOUNTER — Telehealth: Payer: Self-pay

## 2012-05-22 NOTE — Telephone Encounter (Signed)
Message forwarded to physician

## 2012-05-22 NOTE — Telephone Encounter (Signed)
ADVISE BLOOD SUGAR AND THYROID NORMAL. cUT BACK BLOOD SUGAR MED, GLIPIZIDE, TO EVERY OTHER DAY.  She appears to be slightly dehydrated , if not already seeing a nephrologist I will refer , pls check with her  and let me know

## 2012-05-23 DIAGNOSIS — E1149 Type 2 diabetes mellitus with other diabetic neurological complication: Secondary | ICD-10-CM | POA: Diagnosis not present

## 2012-05-23 DIAGNOSIS — E119 Type 2 diabetes mellitus without complications: Secondary | ICD-10-CM | POA: Diagnosis not present

## 2012-05-24 ENCOUNTER — Encounter: Payer: Self-pay | Admitting: Urgent Care

## 2012-05-24 ENCOUNTER — Ambulatory Visit (INDEPENDENT_AMBULATORY_CARE_PROVIDER_SITE_OTHER): Payer: Medicare Other | Admitting: Urgent Care

## 2012-05-24 VITALS — BP 122/66 | HR 75 | Temp 97.4°F | Ht 66.0 in | Wt 227.4 lb

## 2012-05-24 DIAGNOSIS — Z79899 Other long term (current) drug therapy: Secondary | ICD-10-CM

## 2012-05-24 DIAGNOSIS — Z86010 Personal history of colonic polyps: Secondary | ICD-10-CM | POA: Diagnosis not present

## 2012-05-24 DIAGNOSIS — I059 Rheumatic mitral valve disease, unspecified: Secondary | ICD-10-CM | POA: Diagnosis not present

## 2012-05-24 DIAGNOSIS — Z9189 Other specified personal risk factors, not elsewhere classified: Secondary | ICD-10-CM | POA: Insufficient documentation

## 2012-05-24 DIAGNOSIS — Z860101 Personal history of adenomatous and serrated colon polyps: Secondary | ICD-10-CM

## 2012-05-24 DIAGNOSIS — Z4502 Encounter for adjustment and management of automatic implantable cardiac defibrillator: Secondary | ICD-10-CM | POA: Diagnosis not present

## 2012-05-24 DIAGNOSIS — D65 Disseminated intravascular coagulation [defibrination syndrome]: Secondary | ICD-10-CM | POA: Diagnosis not present

## 2012-05-24 DIAGNOSIS — I509 Heart failure, unspecified: Secondary | ICD-10-CM | POA: Diagnosis not present

## 2012-05-24 DIAGNOSIS — I359 Nonrheumatic aortic valve disorder, unspecified: Secondary | ICD-10-CM | POA: Diagnosis not present

## 2012-05-24 HISTORY — DX: Personal history of colonic polyps: Z86.010

## 2012-05-24 HISTORY — DX: Personal history of adenomatous and serrated colon polyps: Z86.0101

## 2012-05-24 MED ORDER — PEG 3350-KCL-NA BICARB-NACL 420 G PO SOLR: 4000.0000 mL | ORAL | Status: AC

## 2012-05-24 NOTE — Patient Instructions (Addendum)
Colonoscopy w/ Dr Gala Romney Hold glipizide day of prep & day of colonoscopy Bring all your medications to the hospital the day of the procedure. Follow blood sugars, call us or your PCP if any problems.

## 2012-05-24 NOTE — Telephone Encounter (Signed)
Called patient and left message for them to return call at the office   

## 2012-05-24 NOTE — Assessment & Plan Note (Addendum)
[name] [name] is a pleasant 78 y.o. female due for surveillance colonoscopy given history of adenomatous colon polyps in 2004. She does have a defibrillator and Endo is to be made aware.  She is also on chronic Coumadin status post aortic/mitral valve replacement.   We did discuss the fact that she is at a slightly higher risk of GI bleeding given the fact that she is on Coumadin, however the benefits of remaining on the Coumadin outweigh the risk of bleeding. We discussed the life threatening nature of an embolic event. She agrees to remain on Coumadin for this procedure. She also understands that a second procedure may be required since she is on Coumadin if she shows signs of significant bleeding or is in need of significant intervention that cannot be performed while on Coumadin.   I have discussed risks & benefits which include, but are not limited to, bleeding, infection, perforation & drug reaction.  The patient agrees with this plan & written consent will be obtained.    Hold glipizide day of prep & day of colonoscopy Bring all your medications to the hospital the day of the procedure. Follow blood sugars, call us or your PCP if any problems.

## 2012-05-24 NOTE — Progress Notes (Signed)
Primary Care Physician:  [name], MD Primary Gastroenterologist:  Dr. Gala Romney  Chief Complaint  Patient presents with  . Colonoscopy    History of adenomatous colon polyps    HPI:  [name] [name] is a 78 y.o. female here to set up surveillance colonoscopy. She has history of adenomatous colon polyps in 2004. Last colonoscopy in 2008 was normal. She has history of aortic and mitral valve replacement, defibrillator, and is on Coumadin chronically. She denies any GI symptoms at this time. Specifically, she denies any lower GI symptoms including constipation, diarrhea, rectal bleeding, melena or weight loss. Overall she feels very well.   Past Medical History  Diagnosis Date  . DJD (degenerative joint disease) of lumbar spine   . Chronic kidney disease, stage I   . Esophageal reflux   . Other and unspecified hyperlipidemia   . Other primary cardiomyopathies   . Obesity, unspecified   . Unspecified hypothyroidism   . DM (diabetes mellitus)   . Essential hypertension, benign   . 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 Pain in joint, pelvic region and thigh   . Insomnia, unspecified   . Iron deficiency 10/04/2011  . IDA (iron deficiency anemia)     Parenteral iron/[name]  . Cancer   . CHF (congestive heart failure)   . COPD (chronic obstructive pulmonary disease)   . Heart murmur   . ICD (implantable cardiac defibrillator) in place   . Adenomatous polyp of colon 12/16/2002    Dr. [name]/[facility]  . Hyperlipemia     Past Surgical History  Procedure Date  . Thyroidectomy   . Pacemaker placed 2004  . Aortic and mitral valve replacement 2001  . Right breast cyst removed     benign   . Right cataract removed     2005  . Defibrillator implanted 2006   . Cardiac valve replacement   . Cardiac catheterization   . Insert / replace / remove pacemaker   . Esophagogastroduodenoscopy 06/12/2007    Rourk- normal  esophagus, small hiatal hernia, otherwise normal stomach, D1, D2  . Colonoscopy 06/12/2007    Rourk- Normal rectum/Normal colon  . Colonoscopy 12/16/02    small hemorrhoids    Current Outpatient Prescriptions  Medication Sig Dispense Refill  . acetaminophen (TYLENOL) 325 MG tablet Take 2 tablets (650 mg total) by mouth every 6 (six) hours as needed (or Fever >/= 101).      . ADVAIR DISKUS 100-50 MCG/DOSE AEPB inhale 1 dose by mouth twice a day as directed  60 each  4  . allopurinol (ZYLOPRIM) 100 MG tablet Take 100 mg by mouth daily.      Marland Kitchen aspirin EC 81 MG EC tablet Take 1 tablet (81 mg total) by mouth daily.      . Calcium Carbonate (CALCIUM 600 PO) Take 900 mg by mouth daily. Patient takes 1&1/2 tablet daily      . DUONEB 0.5-2.5 (3) MG/3ML SOLN USE 1 VIAL IN NEBULIZER 3 TIMES DAILY  3 mL  270  . ezetimibe-simvastatin (VYTORIN) 10-20 MG per tablet Take 1 tablet by mouth 3 (three) times a week.       . feeding supplement (ENSURE) PUDG Take 1 Container by mouth 3 (three) times daily with meals.      . folic acid (FOLVITE) 1 MG tablet take 1 tablet by mouth once daily  100 tablet  6  . furosemide (LASIX) 40 MG tablet Take 1 tablet (  40 mg total) by mouth 2 (two) times daily.  60 tablet  11  . gabapentin (NEURONTIN) 100 MG capsule Take 100-300 mg by mouth at bedtime. For leg pain and burning      . GLIPIZIDE XL 2.5 MG 24 hr tablet TAKE 1 TABLET BY MOUTH ONCE DAILY  90 tablet  1  . glucose blood (FREESTYLE TEST STRIPS) test strip Use as instructed once daily with Freestyle Lite meter  50 each  11  . HYDROcodone-acetaminophen (NORCO) 7.5-325 MG per tablet Take 1 tablet by mouth every 4 (four) hours as needed. pain      . isosorbide-hydrALAZINE (BIDIL) 20-37.5 MG per tablet Take 1 tablet by mouth 3 (three) times daily.  270 tablet  2  . LANOXIN 0.125 MG tablet Take 0.5 tablets (62.5 mcg total) by mouth daily.  90 tablet  1  . levothyroxine (SYNTHROID, LEVOTHROID) 75 MCG tablet Take 1 tablet (75  mcg total) by mouth daily.  90 tablet  3  . loratadine (CLARITIN REDITABS) 10 MG dissolvable tablet Take 10 mg by mouth daily as needed. allergies      . metoprolol succinate (TOPROL-XL) 25 MG 24 hr tablet Take 5 tablets (125 mg total) by mouth daily. Take with or immediately following a meal.  150 tablet  10  . omeprazole (PRILOSEC) 40 MG capsule Take 40 mg by mouth daily.        . potassium chloride SA (K-DUR,KLOR-CON) 20 MEQ tablet Take 30 mEq by mouth daily. 1 1/2 tablet daily      . predniSONE (DELTASONE) 10 MG tablet Take 2 tablets (20 mg total) by mouth 2 (two) times daily.  20 tablet  0  . promethazine (PHENERGAN) 25 MG tablet TAKE 1 TABLET BY MOUTH ONCE DAILY AS NEEDED  90 tablet  1  . temazepam (RESTORIL) 30 MG capsule TAKE 1 CAPSULE AT BEDTIME AS NEEDED FOR SLEEP  30 capsule  3  . warfarin (COUMADIN) 5 MG tablet Take 1 tablet (5 mg total) by mouth daily. Mondays and Friday 5 mg Other days 7.5 mg  90 tablet  0  . polyethylene glycol-electrolytes (TRILYTE) 420 G solution Take 4,000 mLs by mouth as directed.  4000 mL  0  . simethicone (MYLICON) 80 MG chewable tablet Chew 1 tablet (80 mg total) by mouth every 6 (six) hours as needed for flatulence.  30 tablet      Allergies as of 05/24/2012 - Review Complete 05/24/2012  Allergen Reaction Noted  . Niacin  06/02/2008  . Penicillins  02/24/2008    Family History:There is no known family history of colorectal carcinoma , liver disease, or inflammatory bowel disease.  Problem Relation Age of Onset  . Cancer Mother     behind pancres  . Heart disease Father   . Heart disease Sister     Heart attack  . Heart disease Brother   . Diabetes Brother     History   Social History  . Marital Status: Widowed    Spouse Name: N/A    Number of Children: 0  . Years of Education: N/A   Occupational History  . retired    Social History Main Topics  . Smoking status: Former Smoker    Quit date: 03/08/1987  . Smokeless tobacco: Not on  file  . Alcohol Use: No  . Drug Use: No  . Sexually Active: Not on file   Other Topics Concern  . Not on file   Social History Narrative   From  [place] (2004)    Review of Systems: Gen: Denies any fever, chills, sweats, anorexia, fatigue, weakness, malaise, weight loss, and sleep disorder CV: Denies chest pain, angina, palpitations, syncope, orthopnea, PND, peripheral edema, and claudication. Resp: Denies dyspnea at rest, dyspnea with exercise, cough, sputum, wheezing, coughing up blood, and pleurisy. GI: Denies vomiting blood, jaundice, and fecal incontinence.   Denies dysphagia or odynophagia. GU : Denies urinary burning, blood in urine, urinary frequency, urinary hesitancy, nocturnal urination, and urinary incontinence. MS: Denies joint pain, limitation of movement, and swelling, stiffness, low back pain, extremity pain. Denies muscle weakness, cramps, atrophy.  Derm: Denies rash, itching, dry skin, hives, moles, warts, or unhealing ulcers.  Psych: Denies depression, anxiety, memory loss, suicidal ideation, hallucinations, paranoia, and confusion. Heme: Denies bruising, bleeding, and enlarged lymph nodes. Neuro:  Denies any headaches, dizziness, paresthesias. Endo:  Denies any problems with DM, thyroid, adrenal function.  Physical Exam: BP 122/66  Pulse 75  Temp 97.4 F (36.3 C) (Temporal)  Ht 5\' 6"  (1.676 m)  Wt 227 lb 6.4 oz (103.148 kg)  BMI 36.70 kg/m2 No LMP recorded. Patient has had a hysterectomy. General:   Alert,  Well-developed,obese, pleasant and cooperative in NAD.  Ambulates with a cane. Head:  Normocephalic and atraumatic. Eyes:  Sclera clear, no icterus.   Conjunctiva pink. Ears:  Normal auditory acuity. Nose:  No deformity, discharge, or lesions. Mouth:  No deformity or lesions,oropharynx pink & moist. Neck:  Supple; no masses or thyromegaly. Lungs:  Clear throughout to auscultation.   No wheezes, crackles, or rhonchi. No acute distress. Heart:   Defibrillator left upper anterior chest. 2/6 murmur noted and valvular click noted. Regular rate and rhythm. Abdomen:  Protuberant. Normal bowel sounds.  No bruits.  Soft, non-tender and non-distended without masses, hepatosplenomegaly or hernias noted.  No guarding or rebound tenderness.  Exam limited given patient's body habitus. Rectal:  Deferred. Msk:  Symmetrical without gross deformities. Pulses:  Normal pulses noted. Extremities:  No edema. Neurologic:  Alert and  oriented x4;  grossly normal neurologically. Skin:  Intact without significant lesions or rashes. Lymph Nodes:  No significant cervical adenopathy. Psych:  Alert and cooperative. Normal mood and affect.

## 2012-05-24 NOTE — Telephone Encounter (Signed)
Patient aware. They said she had been on a strict liquid intake but now she has started to drink more water now and wanted to let you know

## 2012-05-27 DIAGNOSIS — E119 Type 2 diabetes mellitus without complications: Secondary | ICD-10-CM | POA: Diagnosis not present

## 2012-05-27 DIAGNOSIS — N183 Chronic kidney disease, stage 3 unspecified: Secondary | ICD-10-CM | POA: Diagnosis not present

## 2012-05-27 DIAGNOSIS — I509 Heart failure, unspecified: Secondary | ICD-10-CM | POA: Diagnosis not present

## 2012-05-27 DIAGNOSIS — I428 Other cardiomyopathies: Secondary | ICD-10-CM | POA: Diagnosis not present

## 2012-05-27 DIAGNOSIS — J441 Chronic obstructive pulmonary disease with (acute) exacerbation: Secondary | ICD-10-CM | POA: Diagnosis not present

## 2012-05-27 DIAGNOSIS — I129 Hypertensive chronic kidney disease with stage 1 through stage 4 chronic kidney disease, or unspecified chronic kidney disease: Secondary | ICD-10-CM | POA: Diagnosis not present

## 2012-05-28 ENCOUNTER — Telehealth: Payer: Self-pay | Admitting: Family Medicine

## 2012-05-28 NOTE — Progress Notes (Signed)
Faxed to PCP

## 2012-05-28 NOTE — Telephone Encounter (Signed)
noted 

## 2012-05-28 NOTE — Telephone Encounter (Signed)
Has appt. tomorrow

## 2012-05-29 ENCOUNTER — Encounter: Payer: Self-pay | Admitting: Family Medicine

## 2012-05-29 ENCOUNTER — Ambulatory Visit (INDEPENDENT_AMBULATORY_CARE_PROVIDER_SITE_OTHER): Payer: Medicare Other | Admitting: Family Medicine

## 2012-05-29 ENCOUNTER — Ambulatory Visit: Payer: Medicare Other | Admitting: Family Medicine

## 2012-05-29 VITALS — BP 114/50 | HR 92 | Resp 16 | Ht 66.0 in | Wt 232.0 lb

## 2012-05-29 DIAGNOSIS — E119 Type 2 diabetes mellitus without complications: Secondary | ICD-10-CM | POA: Diagnosis not present

## 2012-05-29 DIAGNOSIS — E785 Hyperlipidemia, unspecified: Secondary | ICD-10-CM

## 2012-05-29 DIAGNOSIS — M479 Spondylosis, unspecified: Secondary | ICD-10-CM

## 2012-05-29 DIAGNOSIS — M25559 Pain in unspecified hip: Secondary | ICD-10-CM

## 2012-05-29 DIAGNOSIS — R5383 Other fatigue: Secondary | ICD-10-CM | POA: Diagnosis not present

## 2012-05-29 DIAGNOSIS — R5381 Other malaise: Secondary | ICD-10-CM | POA: Diagnosis not present

## 2012-05-29 DIAGNOSIS — N3000 Acute cystitis without hematuria: Secondary | ICD-10-CM

## 2012-05-29 DIAGNOSIS — E039 Hypothyroidism, unspecified: Secondary | ICD-10-CM

## 2012-05-29 LAB — POCT URINALYSIS DIPSTICK
Bilirubin, UA: NEGATIVE
Blood, UA: NEGATIVE
Glucose, UA: NEGATIVE
Ketones, UA: NEGATIVE
Leukocytes, UA: NEGATIVE
Nitrite, UA: NEGATIVE
Protein, UA: NEGATIVE
Spec Grav, UA: 1.01
Urobilinogen, UA: 0.2
pH, UA: 5.5

## 2012-05-29 MED ORDER — FENTANYL 25 MCG/HR TD PT72: 1.0000 | MEDICATED_PATCH | TRANSDERMAL | Status: DC

## 2012-05-29 NOTE — Patient Instructions (Addendum)
F/u end September , as before.  Cbc and chem 7 today since you feel weak.  I want to start a pain patch to help with pain management. Please stop the hydrocodone   Urine shows no sign of infection

## 2012-05-30 DIAGNOSIS — I428 Other cardiomyopathies: Secondary | ICD-10-CM | POA: Diagnosis not present

## 2012-05-30 DIAGNOSIS — D649 Anemia, unspecified: Secondary | ICD-10-CM | POA: Diagnosis not present

## 2012-05-30 DIAGNOSIS — I509 Heart failure, unspecified: Secondary | ICD-10-CM | POA: Diagnosis not present

## 2012-05-30 DIAGNOSIS — N183 Chronic kidney disease, stage 3 unspecified: Secondary | ICD-10-CM | POA: Diagnosis not present

## 2012-05-30 DIAGNOSIS — E559 Vitamin D deficiency, unspecified: Secondary | ICD-10-CM | POA: Diagnosis not present

## 2012-05-30 DIAGNOSIS — R5383 Other fatigue: Secondary | ICD-10-CM | POA: Diagnosis not present

## 2012-05-30 DIAGNOSIS — N189 Chronic kidney disease, unspecified: Secondary | ICD-10-CM | POA: Diagnosis not present

## 2012-05-30 DIAGNOSIS — Z79899 Other long term (current) drug therapy: Secondary | ICD-10-CM | POA: Diagnosis not present

## 2012-05-30 DIAGNOSIS — J441 Chronic obstructive pulmonary disease with (acute) exacerbation: Secondary | ICD-10-CM | POA: Diagnosis not present

## 2012-05-30 DIAGNOSIS — M479 Spondylosis, unspecified: Secondary | ICD-10-CM | POA: Diagnosis not present

## 2012-05-30 DIAGNOSIS — R5381 Other malaise: Secondary | ICD-10-CM | POA: Diagnosis not present

## 2012-05-30 DIAGNOSIS — E119 Type 2 diabetes mellitus without complications: Secondary | ICD-10-CM | POA: Diagnosis not present

## 2012-05-30 DIAGNOSIS — I129 Hypertensive chronic kidney disease with stage 1 through stage 4 chronic kidney disease, or unspecified chronic kidney disease: Secondary | ICD-10-CM | POA: Diagnosis not present

## 2012-05-30 DIAGNOSIS — E039 Hypothyroidism, unspecified: Secondary | ICD-10-CM | POA: Diagnosis not present

## 2012-05-30 DIAGNOSIS — R0602 Shortness of breath: Secondary | ICD-10-CM | POA: Diagnosis not present

## 2012-06-02 NOTE — Assessment & Plan Note (Signed)
Controlled, no change in medication  

## 2012-06-02 NOTE — Assessment & Plan Note (Signed)
Increased and uncontrolled start fentanyl patch

## 2012-06-02 NOTE — Assessment & Plan Note (Signed)
Currently symptomatic , however urinalysis is totally normal, pt reassured

## 2012-06-02 NOTE — Progress Notes (Signed)
  Subjective:    Patient ID: [name] [name], female    DOB: [birth year], 78 y.o.   MRN: [id number]  HPI Pt in with primary c/o uncontrolled hip and leg pain. She was recently very ill, hospitalized from 6/27 to 7/9 with acute respiratory failure, then required rehabilitation prior to returning home.Hospital course was also complicated by a warfarin induced coagulopathy States she still feels weak from her recent illness, esp since rest is disturbed and mobility limited by pain Denies symptoms of uncontrolled blood sugar  Review of Systems See HPI Denies recent fever or chills. Denies sinus pressure, nasal congestion, ear pain or sore throat. Denies chest congestion, productive cough or wheezing. Denies chest pains, palpitations and leg swelling Denies abdominal pain, nausea, vomiting,diarrhea or constipation.   C/o urinary frequency and mild dysuria for the past 2 days, concerned , since she had a catheter   Denies headaches, seizures, numbness, or tingling. Denies depression, anxiety or insomnia. Denies skin break down or rash.        Objective:   Physical Exam  Patient alert and oriented and in no cardiopulmonary distress.  HEENT: No facial asymmetry, EOMI, no sinus tenderness,  oropharynx pink and moist.  Neck supple no adenopathy.  Chest: Clear to auscultation bilaterally.  CVS: S1, S2 no murmurs, no S3.  ABD: Soft non tender. Bowel sounds normal.No renal angle or suprapubic tenderness  Ext: No edema  MS: decreased  ROM spine,  hips and knees.  Skin: Intact, no ulcerations or rash noted.  Psych: Good eye contact, normal affect. Memory intact not anxious or depressed appearing.  CNS: CN 2-12 intact, power,  normal throughout.       Assessment & Plan:

## 2012-06-02 NOTE — Assessment & Plan Note (Signed)
Elevated triglycerides, and low lDL, dietary change discussed

## 2012-06-02 NOTE — Assessment & Plan Note (Signed)
Uncontrolled back and lower extremity pain, start fentanyl patch

## 2012-06-03 ENCOUNTER — Telehealth: Payer: Self-pay | Admitting: Family Medicine

## 2012-06-03 ENCOUNTER — Other Ambulatory Visit: Payer: Self-pay | Admitting: Family Medicine

## 2012-06-03 ENCOUNTER — Telehealth (HOSPITAL_COMMUNITY): Payer: Self-pay | Admitting: Dietician

## 2012-06-03 NOTE — Telephone Encounter (Signed)
Omeprazole has been entered historically, pls fax after you let pt know

## 2012-06-03 NOTE — Telephone Encounter (Signed)
Received referral via fax from Dr. [name] on 05/29/12 for dx: diabetes, HTN. Noted last pt visit was 09/14/05 for group diabetes education class.

## 2012-06-03 NOTE — Telephone Encounter (Signed)
Please advise 

## 2012-06-04 ENCOUNTER — Telehealth (HOSPITAL_COMMUNITY): Payer: Self-pay | Admitting: Oncology

## 2012-06-04 ENCOUNTER — Encounter: Payer: Self-pay | Admitting: *Deleted

## 2012-06-04 ENCOUNTER — Other Ambulatory Visit: Payer: Self-pay

## 2012-06-04 ENCOUNTER — Other Ambulatory Visit (HOSPITAL_COMMUNITY): Payer: Self-pay | Admitting: Oncology

## 2012-06-04 DIAGNOSIS — E611 Iron deficiency: Secondary | ICD-10-CM

## 2012-06-04 DIAGNOSIS — N183 Chronic kidney disease, stage 3 unspecified: Secondary | ICD-10-CM | POA: Diagnosis not present

## 2012-06-04 DIAGNOSIS — J449 Chronic obstructive pulmonary disease, unspecified: Secondary | ICD-10-CM | POA: Diagnosis not present

## 2012-06-04 DIAGNOSIS — E1165 Type 2 diabetes mellitus with hyperglycemia: Secondary | ICD-10-CM | POA: Diagnosis not present

## 2012-06-04 DIAGNOSIS — I509 Heart failure, unspecified: Secondary | ICD-10-CM | POA: Diagnosis not present

## 2012-06-04 DIAGNOSIS — I10 Essential (primary) hypertension: Secondary | ICD-10-CM | POA: Diagnosis not present

## 2012-06-04 DIAGNOSIS — N189 Chronic kidney disease, unspecified: Secondary | ICD-10-CM | POA: Diagnosis not present

## 2012-06-04 DIAGNOSIS — D649 Anemia, unspecified: Secondary | ICD-10-CM | POA: Diagnosis not present

## 2012-06-04 DIAGNOSIS — E1129 Type 2 diabetes mellitus with other diabetic kidney complication: Secondary | ICD-10-CM | POA: Diagnosis not present

## 2012-06-04 MED ORDER — OMEPRAZOLE 40 MG PO CPDR: 40.0000 mg | DELAYED_RELEASE_CAPSULE | Freq: Every day | ORAL | Status: DC

## 2012-06-04 NOTE — Telephone Encounter (Signed)
Sent in

## 2012-06-05 ENCOUNTER — Encounter (HOSPITAL_COMMUNITY): Payer: Self-pay | Admitting: Pharmacy Technician

## 2012-06-06 ENCOUNTER — Other Ambulatory Visit (HOSPITAL_COMMUNITY): Payer: Medicare Other

## 2012-06-06 ENCOUNTER — Other Ambulatory Visit (HOSPITAL_COMMUNITY): Payer: Self-pay | Admitting: Oncology

## 2012-06-06 DIAGNOSIS — J441 Chronic obstructive pulmonary disease with (acute) exacerbation: Secondary | ICD-10-CM | POA: Diagnosis not present

## 2012-06-06 DIAGNOSIS — E611 Iron deficiency: Secondary | ICD-10-CM

## 2012-06-06 DIAGNOSIS — N183 Chronic kidney disease, stage 3 unspecified: Secondary | ICD-10-CM | POA: Diagnosis not present

## 2012-06-06 DIAGNOSIS — I129 Hypertensive chronic kidney disease with stage 1 through stage 4 chronic kidney disease, or unspecified chronic kidney disease: Secondary | ICD-10-CM | POA: Diagnosis not present

## 2012-06-06 DIAGNOSIS — E119 Type 2 diabetes mellitus without complications: Secondary | ICD-10-CM | POA: Diagnosis not present

## 2012-06-06 DIAGNOSIS — I509 Heart failure, unspecified: Secondary | ICD-10-CM | POA: Diagnosis not present

## 2012-06-06 DIAGNOSIS — I428 Other cardiomyopathies: Secondary | ICD-10-CM | POA: Diagnosis not present

## 2012-06-07 NOTE — Telephone Encounter (Signed)
Sent letter to pt home via US Mail in attempt to contact pt to schedule appointment.  

## 2012-06-11 ENCOUNTER — Encounter (HOSPITAL_COMMUNITY): Payer: Medicare Other | Attending: Oncology

## 2012-06-11 VITALS — BP 146/63 | HR 71

## 2012-06-11 DIAGNOSIS — D509 Iron deficiency anemia, unspecified: Secondary | ICD-10-CM | POA: Diagnosis not present

## 2012-06-11 DIAGNOSIS — E611 Iron deficiency: Secondary | ICD-10-CM

## 2012-06-11 MED ORDER — SODIUM CHLORIDE 0.9 % IV SOLN
1020.0000 mg | Freq: Once | INTRAVENOUS | Status: AC
  Administered 2012-06-11: 1020 mg via INTRAVENOUS
  Filled 2012-06-11: qty 34

## 2012-06-11 MED ORDER — SODIUM CHLORIDE 0.9 % IJ SOLN
INTRAMUSCULAR | Status: AC
  Filled 2012-06-11: qty 10

## 2012-06-11 NOTE — Progress Notes (Signed)
Tolerated feraheme infusion without difficulty.

## 2012-06-11 NOTE — Telephone Encounter (Signed)
Sent letter to pt home via US Mail in attempt to contact pt to schedule appointment.  

## 2012-06-12 DIAGNOSIS — H40019 Open angle with borderline findings, low risk, unspecified eye: Secondary | ICD-10-CM | POA: Diagnosis not present

## 2012-06-17 ENCOUNTER — Ambulatory Visit (INDEPENDENT_AMBULATORY_CARE_PROVIDER_SITE_OTHER): Payer: Medicare Other | Admitting: Family Medicine

## 2012-06-17 ENCOUNTER — Encounter: Payer: Self-pay | Admitting: Family Medicine

## 2012-06-17 VITALS — BP 120/60 | HR 80 | Resp 15

## 2012-06-17 DIAGNOSIS — E039 Hypothyroidism, unspecified: Secondary | ICD-10-CM | POA: Diagnosis not present

## 2012-06-17 DIAGNOSIS — M545 Low back pain, unspecified: Secondary | ICD-10-CM

## 2012-06-17 DIAGNOSIS — E785 Hyperlipidemia, unspecified: Secondary | ICD-10-CM | POA: Diagnosis not present

## 2012-06-17 DIAGNOSIS — E669 Obesity, unspecified: Secondary | ICD-10-CM

## 2012-06-17 DIAGNOSIS — I10 Essential (primary) hypertension: Secondary | ICD-10-CM | POA: Diagnosis not present

## 2012-06-17 DIAGNOSIS — E119 Type 2 diabetes mellitus without complications: Secondary | ICD-10-CM | POA: Diagnosis not present

## 2012-06-17 DIAGNOSIS — I5022 Chronic systolic (congestive) heart failure: Secondary | ICD-10-CM

## 2012-06-17 NOTE — Progress Notes (Signed)
  Subjective:    Patient ID: [name] [name], female    DOB: 22-Jun-1932, 78 y.o.   MRN: [id number]  HPI The PT is here for follow up and re-evaluation of chronic medical conditions, medication management and review of any available recent lab and radiology data.  Preventive health is updated, specifically  Cancer screening and Immunization.   Questions or concerns regarding consultations or procedures which the PT has had in the interim are  addressed. The PT denies any adverse reactions to current medications since the last visit. States the pain patch appears to be helping with chronic pain management, she is not excessively sleepy, and has had  no adverse side effects Wants flu vaccine today  Fasting blood sugars are seldom over 110 and she denies hypoglycemic episodes      Review of Systems See HPI Denies recent fever or chills. Denies sinus pressure, nasal congestion, ear pain or sore throat. Denies chest congestion, productive cough or wheezing. Denies chest pains, palpitations and leg swelling Denies abdominal pain, nausea, vomiting,diarrhea or constipation.   Denies dysuria, frequency, hesitancy or incontinence. Improved  joint pain, swelling and limitation in mobility. Denies headaches, seizures, numbness, or tingling. Denies depression, anxiety or insomnia. Denies skin break down or rash.        Objective:   Physical Exam        Assessment & Plan:

## 2012-06-17 NOTE — Assessment & Plan Note (Signed)
Controlled, no change in medication DASH diet and commitment to daily physical activity for a minimum of 30 minutes discussed and encouraged, as a part of hypertension management. The importance of attaining a healthy weight is also discussed.  

## 2012-06-17 NOTE — Assessment & Plan Note (Signed)
Stable at this time, asymptomatic on current meds

## 2012-06-17 NOTE — Assessment & Plan Note (Signed)
Controlled, no change in medication  

## 2012-06-17 NOTE — Assessment & Plan Note (Signed)
Improved control with the addition of duragesic, continue same

## 2012-06-17 NOTE — Assessment & Plan Note (Signed)
Updated labs needed. Elevated triglycerides, no med change at this time Hyperlipidemia:Low fat diet discussed and encouraged.

## 2012-06-17 NOTE — Telephone Encounter (Signed)
Pt has not responded to attempts to contact to schedule appointment. Referral filed.  

## 2012-06-17 NOTE — Patient Instructions (Addendum)
Annual wellness 2nd week in December, pls call if you need me before  Fasting lipid, cmp and EGFR, HBa1C tSH in Decmber  Flu vaccine today  Call every month for your new pain patch which is helping you

## 2012-06-17 NOTE — Assessment & Plan Note (Signed)
Controlled, no change in medication Patient advised to reduce carb and sweets, commit to regular physical activity, take meds as prescribed, test blood as directed, and attempt to lose weight, to improve blood sugar control.  

## 2012-06-19 DIAGNOSIS — I509 Heart failure, unspecified: Secondary | ICD-10-CM | POA: Diagnosis not present

## 2012-06-19 DIAGNOSIS — I129 Hypertensive chronic kidney disease with stage 1 through stage 4 chronic kidney disease, or unspecified chronic kidney disease: Secondary | ICD-10-CM | POA: Diagnosis not present

## 2012-06-19 DIAGNOSIS — E119 Type 2 diabetes mellitus without complications: Secondary | ICD-10-CM | POA: Diagnosis not present

## 2012-06-19 DIAGNOSIS — I428 Other cardiomyopathies: Secondary | ICD-10-CM | POA: Diagnosis not present

## 2012-06-19 DIAGNOSIS — J441 Chronic obstructive pulmonary disease with (acute) exacerbation: Secondary | ICD-10-CM | POA: Diagnosis not present

## 2012-06-19 DIAGNOSIS — N183 Chronic kidney disease, stage 3 unspecified: Secondary | ICD-10-CM | POA: Diagnosis not present

## 2012-06-19 MED ORDER — SODIUM CHLORIDE 0.45 % IV SOLN
INTRAVENOUS | Status: DC
  Administered 2012-06-20: 12:00:00 via INTRAVENOUS

## 2012-06-20 ENCOUNTER — Encounter (HOSPITAL_COMMUNITY): Payer: Self-pay | Admitting: *Deleted

## 2012-06-20 ENCOUNTER — Encounter (HOSPITAL_COMMUNITY)
Admission: RE | Disposition: A | Discharge status: Home / Self Care | Payer: Self-pay | Source: Ambulatory Visit | Attending: Internal Medicine

## 2012-06-20 ENCOUNTER — Ambulatory Visit (HOSPITAL_COMMUNITY)
Admission: RE | Admit: 2012-06-20 | Discharge: 2012-06-20 | Disposition: A | Discharge status: Home / Self Care | Payer: Medicare Other | Source: Ambulatory Visit | Attending: Internal Medicine | Admitting: Internal Medicine

## 2012-06-20 DIAGNOSIS — Z860100 Personal history of colon polyps, unspecified: Secondary | ICD-10-CM | POA: Diagnosis not present

## 2012-06-20 DIAGNOSIS — J449 Chronic obstructive pulmonary disease, unspecified: Secondary | ICD-10-CM | POA: Diagnosis not present

## 2012-06-20 DIAGNOSIS — E119 Type 2 diabetes mellitus without complications: Secondary | ICD-10-CM | POA: Diagnosis not present

## 2012-06-20 DIAGNOSIS — Z01812 Encounter for preprocedural laboratory examination: Secondary | ICD-10-CM | POA: Diagnosis not present

## 2012-06-20 DIAGNOSIS — Z1211 Encounter for screening for malignant neoplasm of colon: Secondary | ICD-10-CM | POA: Diagnosis not present

## 2012-06-20 DIAGNOSIS — J4489 Other specified chronic obstructive pulmonary disease: Secondary | ICD-10-CM | POA: Insufficient documentation

## 2012-06-20 DIAGNOSIS — I10 Essential (primary) hypertension: Secondary | ICD-10-CM | POA: Insufficient documentation

## 2012-06-20 DIAGNOSIS — Z79899 Other long term (current) drug therapy: Secondary | ICD-10-CM | POA: Diagnosis not present

## 2012-06-20 DIAGNOSIS — Z7901 Long term (current) use of anticoagulants: Secondary | ICD-10-CM | POA: Insufficient documentation

## 2012-06-20 HISTORY — PX: COLONOSCOPY: SHX5424

## 2012-06-20 LAB — GLUCOSE, CAPILLARY: Glucose-Capillary: 96 mg/dL (ref 70–99)

## 2012-06-20 SURGERY — COLONOSCOPY
Anesthesia: Moderate Sedation

## 2012-06-20 MED ORDER — MEPERIDINE HCL 100 MG/ML IJ SOLN
INTRAMUSCULAR | Status: AC
  Filled 2012-06-20: qty 1

## 2012-06-20 MED ORDER — MEPERIDINE HCL 100 MG/ML IJ SOLN
INTRAMUSCULAR | Status: DC | PRN
  Administered 2012-06-20 (×2): 25 mg via INTRAVENOUS

## 2012-06-20 MED ORDER — STERILE WATER FOR IRRIGATION IR SOLN
Status: DC | PRN
  Administered 2012-06-20: 13:00:00

## 2012-06-20 MED ORDER — MIDAZOLAM HCL 5 MG/5ML IJ SOLN
INTRAMUSCULAR | Status: DC | PRN
  Administered 2012-06-20 (×3): 1 mg via INTRAVENOUS

## 2012-06-20 MED ORDER — MIDAZOLAM HCL 5 MG/5ML IJ SOLN
INTRAMUSCULAR | Status: AC
  Filled 2012-06-20: qty 10

## 2012-06-20 NOTE — Interval H&P Note (Signed)
History and Physical Interval Note:  06/20/2012 1:17 PM  [name] [name]  has presented today for surgery, with the diagnosis of HISTORY OF COLON POLYPS  AND HIGH RISK  The various methods of treatment have been discussed with the patient and family. After consideration of risks, benefits and other options for treatment, the patient has consented to  Procedure(s) (LRB) with comments: COLONOSCOPY (N/A) - 10:45 as a surgical intervention .  The patient's history has been reviewed, patient examined, no change in status, stable for surgery.  I have reviewed the patient's chart and labs.  Questions were answered to the patient's satisfaction.     Manus Rudd

## 2012-06-20 NOTE — Op Note (Signed)
[facility] [address] [place], [address]   COLONOSCOPY PROCEDURE REPORT  PATIENT: [name] [name], [name] [name]  MR#:         [id number] BIRTHDATE: 09/26/1931 , 39  yrs. old GENDER: Female ENDOSCOPIST: R.  Garfield Cornea, MD FACP FACG REFERRED BY:  [name], M.D. PROCEDURE DATE:  06/20/2012 PROCEDURE:    surveillance colonoscopy INDICATIONS: distant history colonic polyp; negative colonoscopy 2008. Coumadin not stopped for this procedure  INFORMED CONSENT:  The risks, benefits, alternatives and imponderables including but not limited to bleeding, perforation as well as the possibility of a missed lesion have been reviewed.  The potential for biopsy, lesion removal, etc. have also been discussed.  Questions have been answered.  All parties agreeable. Please see the history and physical in the medical record for more information.  MEDICATIONS: Versed 3 mg IV and Demerol 50 mg IV in divided doses  DESCRIPTION OF PROCEDURE:  After a digital rectal exam was performed, the [id number] [place]:[id number])  colonoscope was advanced from the anus through the rectum and colon to the area of the cecum, ileocecal valve and appendiceal orifice.  The cecum was deeply intubated.  These structures were well-seen and photographed for the record.  From the level of the cecum and ileocecal valve, the scope was slowly and cautiously withdrawn.  The mucosal surfaces were carefully surveyed utilizing scope tip deflection to facilitate fold flattening as needed.  The scope was pulled down into the rectum where a thorough examination including retroflexion was performed.    FINDINGS:  suboptimal/marginal preparation. Normal rectum. Somewhat elongated tortuous but otherwise normal appearing colonic mucosa.  THERAPEUTIC / DIAGNOSTIC MANEUVERS PERFORMED:  none  COMPLICATIONS: none  CECAL WITHDRAWAL TIME:  12 minutes  IMPRESSION:  Elongated, tortuous but otherwise normal appearing:-Suboptimal  preparation.  RECOMMENDATIONS: Followup Dr. [name]   _______________________________ eSigned:  R. Garfield Cornea, MD FACP [facility] 06/20/2012 1:44 PM   CC:

## 2012-06-20 NOTE — H&P (View-Only) (Signed)
Primary Care Physician:  [name], MD Primary Gastroenterologist:  Dr. Gala Romney  Chief Complaint  Patient presents with  . Colonoscopy    History of adenomatous colon polyps    HPI:  [name] [name] is a 78 y.o. female here to set up surveillance colonoscopy. She has history of adenomatous colon polyps in 2004. Last colonoscopy in 2008 was normal. She has history of aortic and mitral valve replacement, defibrillator, and is on Coumadin chronically. She denies any GI symptoms at this time. Specifically, she denies any lower GI symptoms including constipation, diarrhea, rectal bleeding, melena or weight loss. Overall she feels very well.   Past Medical History  Diagnosis Date  . DJD (degenerative joint disease) of lumbar spine   . Chronic kidney disease, stage I   . Esophageal reflux   . Other and unspecified hyperlipidemia   . Other primary cardiomyopathies   . Obesity, unspecified   . Unspecified hypothyroidism   . DM (diabetes mellitus)   . Essential hypertension, benign   . 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 Pain in joint, pelvic region and thigh   . Insomnia, unspecified   . Iron deficiency 10/04/2011  . IDA (iron deficiency anemia)     Parenteral iron/[name]  . Cancer   . CHF (congestive heart failure)   . COPD (chronic obstructive pulmonary disease)   . Heart murmur   . ICD (implantable cardiac defibrillator) in place   . Adenomatous polyp of colon 12/16/2002    Dr. [name]/[facility]  . Hyperlipemia     Past Surgical History  Procedure Date  . Thyroidectomy   . Pacemaker placed 2004  . Aortic and mitral valve replacement 2001  . Right breast cyst removed     benign   . Right cataract removed     2005  . Defibrillator implanted 2006   . Cardiac valve replacement   . Cardiac catheterization   . Insert / replace / remove pacemaker   . Esophagogastroduodenoscopy 06/12/2007    Rourk- normal  esophagus, small hiatal hernia, otherwise normal stomach, D1, D2  . Colonoscopy 06/12/2007    Rourk- Normal rectum/Normal colon  . Colonoscopy 12/16/02    small hemorrhoids    Current Outpatient Prescriptions  Medication Sig Dispense Refill  . acetaminophen (TYLENOL) 325 MG tablet Take 2 tablets (650 mg total) by mouth every 6 (six) hours as needed (or Fever >/= 101).      . ADVAIR DISKUS 100-50 MCG/DOSE AEPB inhale 1 dose by mouth twice a day as directed  60 each  4  . allopurinol (ZYLOPRIM) 100 MG tablet Take 100 mg by mouth daily.      Marland Kitchen aspirin EC 81 MG EC tablet Take 1 tablet (81 mg total) by mouth daily.      . Calcium Carbonate (CALCIUM 600 PO) Take 900 mg by mouth daily. Patient takes 1&1/2 tablet daily      . DUONEB 0.5-2.5 (3) MG/3ML SOLN USE 1 VIAL IN NEBULIZER 3 TIMES DAILY  3 mL  270  . ezetimibe-simvastatin (VYTORIN) 10-20 MG per tablet Take 1 tablet by mouth 3 (three) times a week.       . feeding supplement (ENSURE) PUDG Take 1 Container by mouth 3 (three) times daily with meals.      . folic acid (FOLVITE) 1 MG tablet take 1 tablet by mouth once daily  100 tablet  6  . furosemide (LASIX) 40 MG tablet Take 1 tablet (  40 mg total) by mouth 2 (two) times daily.  60 tablet  11  . gabapentin (NEURONTIN) 100 MG capsule Take 100-300 mg by mouth at bedtime. For leg pain and burning      . GLIPIZIDE XL 2.5 MG 24 hr tablet TAKE 1 TABLET BY MOUTH ONCE DAILY  90 tablet  1  . glucose blood (FREESTYLE TEST STRIPS) test strip Use as instructed once daily with Freestyle Lite meter  50 each  11  . HYDROcodone-acetaminophen (NORCO) 7.5-325 MG per tablet Take 1 tablet by mouth every 4 (four) hours as needed. pain      . isosorbide-hydrALAZINE (BIDIL) 20-37.5 MG per tablet Take 1 tablet by mouth 3 (three) times daily.  270 tablet  2  . LANOXIN 0.125 MG tablet Take 0.5 tablets (62.5 mcg total) by mouth daily.  90 tablet  1  . levothyroxine (SYNTHROID, LEVOTHROID) 75 MCG tablet Take 1 tablet (75  mcg total) by mouth daily.  90 tablet  3  . loratadine (CLARITIN REDITABS) 10 MG dissolvable tablet Take 10 mg by mouth daily as needed. allergies      . metoprolol succinate (TOPROL-XL) 25 MG 24 hr tablet Take 5 tablets (125 mg total) by mouth daily. Take with or immediately following a meal.  150 tablet  10  . omeprazole (PRILOSEC) 40 MG capsule Take 40 mg by mouth daily.        . potassium chloride SA (K-DUR,KLOR-CON) 20 MEQ tablet Take 30 mEq by mouth daily. 1 1/2 tablet daily      . predniSONE (DELTASONE) 10 MG tablet Take 2 tablets (20 mg total) by mouth 2 (two) times daily.  20 tablet  0  . promethazine (PHENERGAN) 25 MG tablet TAKE 1 TABLET BY MOUTH ONCE DAILY AS NEEDED  90 tablet  1  . temazepam (RESTORIL) 30 MG capsule TAKE 1 CAPSULE AT BEDTIME AS NEEDED FOR SLEEP  30 capsule  3  . warfarin (COUMADIN) 5 MG tablet Take 1 tablet (5 mg total) by mouth daily. Mondays and Friday 5 mg Other days 7.5 mg  90 tablet  0  . polyethylene glycol-electrolytes (TRILYTE) 420 G solution Take 4,000 mLs by mouth as directed.  4000 mL  0  . simethicone (MYLICON) 80 MG chewable tablet Chew 1 tablet (80 mg total) by mouth every 6 (six) hours as needed for flatulence.  30 tablet      Allergies as of 05/24/2012 - Review Complete 05/24/2012  Allergen Reaction Noted  . Niacin  06/02/2008  . Penicillins  02/24/2008    Family History:There is no known family history of colorectal carcinoma , liver disease, or inflammatory bowel disease.  Problem Relation Age of Onset  . Cancer Mother     behind pancres  . Heart disease Father   . Heart disease Sister     Heart attack  . Heart disease Brother   . Diabetes Brother     History   Social History  . Marital Status: Widowed    Spouse Name: N/A    Number of Children: 0  . Years of Education: N/A   Occupational History  . retired    Social History Main Topics  . Smoking status: Former Smoker    Quit date: 03/08/1987  . Smokeless tobacco: Not on  file  . Alcohol Use: No  . Drug Use: No  . Sexually Active: Not on file   Other Topics Concern  . Not on file   Social History Narrative   From  [place] (2004)    Review of Systems: Gen: Denies any fever, chills, sweats, anorexia, fatigue, weakness, malaise, weight loss, and sleep disorder CV: Denies chest pain, angina, palpitations, syncope, orthopnea, PND, peripheral edema, and claudication. Resp: Denies dyspnea at rest, dyspnea with exercise, cough, sputum, wheezing, coughing up blood, and pleurisy. GI: Denies vomiting blood, jaundice, and fecal incontinence.   Denies dysphagia or odynophagia. GU : Denies urinary burning, blood in urine, urinary frequency, urinary hesitancy, nocturnal urination, and urinary incontinence. MS: Denies joint pain, limitation of movement, and swelling, stiffness, low back pain, extremity pain. Denies muscle weakness, cramps, atrophy.  Derm: Denies rash, itching, dry skin, hives, moles, warts, or unhealing ulcers.  Psych: Denies depression, anxiety, memory loss, suicidal ideation, hallucinations, paranoia, and confusion. Heme: Denies bruising, bleeding, and enlarged lymph nodes. Neuro:  Denies any headaches, dizziness, paresthesias. Endo:  Denies any problems with DM, thyroid, adrenal function.  Physical Exam: BP 122/66  Pulse 75  Temp 97.4 F (36.3 C) (Temporal)  Ht 5\' 6"  (1.676 m)  Wt 227 lb 6.4 oz (103.148 kg)  BMI 36.70 kg/m2 No LMP recorded. Patient has had a hysterectomy. General:   Alert,  Well-developed,obese, pleasant and cooperative in NAD.  Ambulates with a cane. Head:  Normocephalic and atraumatic. Eyes:  Sclera clear, no icterus.   Conjunctiva pink. Ears:  Normal auditory acuity. Nose:  No deformity, discharge, or lesions. Mouth:  No deformity or lesions,oropharynx pink & moist. Neck:  Supple; no masses or thyromegaly. Lungs:  Clear throughout to auscultation.   No wheezes, crackles, or rhonchi. No acute distress. Heart:   Defibrillator left upper anterior chest. 2/6 murmur noted and valvular click noted. Regular rate and rhythm. Abdomen:  Protuberant. Normal bowel sounds.  No bruits.  Soft, non-tender and non-distended without masses, hepatosplenomegaly or hernias noted.  No guarding or rebound tenderness.  Exam limited given patient's body habitus. Rectal:  Deferred. Msk:  Symmetrical without gross deformities. Pulses:  Normal pulses noted. Extremities:  No edema. Neurologic:  Alert and  oriented x4;  grossly normal neurologically. Skin:  Intact without significant lesions or rashes. Lymph Nodes:  No significant cervical adenopathy. Psych:  Alert and cooperative. Normal mood and affect.

## 2012-06-24 ENCOUNTER — Other Ambulatory Visit: Payer: Self-pay

## 2012-06-24 DIAGNOSIS — M479 Spondylosis, unspecified: Secondary | ICD-10-CM

## 2012-06-24 MED ORDER — LEVOTHYROXINE SODIUM 75 MCG PO TABS: 75.0000 ug | ORAL_TABLET | Freq: Every day | ORAL | Status: DC

## 2012-06-24 MED ORDER — FENTANYL 25 MCG/HR TD PT72: 1.0000 | MEDICATED_PATCH | TRANSDERMAL | Status: DC

## 2012-06-25 ENCOUNTER — Telehealth: Payer: Self-pay | Admitting: Family Medicine

## 2012-06-25 NOTE — Telephone Encounter (Signed)
I doubt it will be covered, please let her know , and you can send in the 1 week supply she is requesting brand name only

## 2012-06-25 NOTE — Telephone Encounter (Signed)
She wanted a 7 day supply of her thyroid med sent to [facility]. Said the brand name wasn't sent in. Is it ok to send refill for brand name Synthroid. Will pharmacy cover that?

## 2012-06-26 ENCOUNTER — Encounter (HOSPITAL_COMMUNITY): Payer: Self-pay | Admitting: Internal Medicine

## 2012-06-26 NOTE — Telephone Encounter (Signed)
Called and left message for pt to return call.  

## 2012-06-28 DIAGNOSIS — I059 Rheumatic mitral valve disease, unspecified: Secondary | ICD-10-CM | POA: Diagnosis not present

## 2012-06-28 DIAGNOSIS — D65 Disseminated intravascular coagulation [defibrination syndrome]: Secondary | ICD-10-CM | POA: Diagnosis not present

## 2012-06-28 NOTE — Telephone Encounter (Signed)
Spoke with patient and she stated problem resolved.

## 2012-07-17 DIAGNOSIS — Z23 Encounter for immunization: Secondary | ICD-10-CM

## 2012-07-19 DIAGNOSIS — I059 Rheumatic mitral valve disease, unspecified: Secondary | ICD-10-CM | POA: Diagnosis not present

## 2012-07-23 ENCOUNTER — Telehealth: Payer: Self-pay | Admitting: Family Medicine

## 2012-07-23 ENCOUNTER — Other Ambulatory Visit: Payer: Self-pay | Admitting: Family Medicine

## 2012-07-23 MED ORDER — FENTANYL 50 MCG/HR TD PT72: 1.0000 | MEDICATED_PATCH | TRANSDERMAL | Status: DC

## 2012-07-23 NOTE — Telephone Encounter (Signed)
pls let pt know dose has been increased and give her the new script with the higher dose printed. Advise if she notices excessive sleepiness, wuill need to cut back dose but I do believe this could  work for her

## 2012-07-23 NOTE — Telephone Encounter (Signed)
Patient aware of new order and will collect.

## 2012-08-01 DIAGNOSIS — E1149 Type 2 diabetes mellitus with other diabetic neurological complication: Secondary | ICD-10-CM | POA: Diagnosis not present

## 2012-08-01 DIAGNOSIS — E119 Type 2 diabetes mellitus without complications: Secondary | ICD-10-CM | POA: Diagnosis not present

## 2012-08-04 ENCOUNTER — Other Ambulatory Visit: Payer: Self-pay | Admitting: Family Medicine

## 2012-08-08 DIAGNOSIS — I059 Rheumatic mitral valve disease, unspecified: Secondary | ICD-10-CM | POA: Diagnosis not present

## 2012-08-09 ENCOUNTER — Other Ambulatory Visit: Payer: Self-pay

## 2012-08-09 MED ORDER — FENTANYL 50 MCG/HR TD PT72: 1.0000 | MEDICATED_PATCH | TRANSDERMAL | Status: DC

## 2012-08-16 ENCOUNTER — Other Ambulatory Visit: Payer: Self-pay

## 2012-08-16 ENCOUNTER — Other Ambulatory Visit: Payer: Self-pay | Admitting: Family Medicine

## 2012-08-16 MED ORDER — TOPROL XL 100 MG PO TB24: 100.0000 mg | ORAL_TABLET | Freq: Every day | ORAL | Status: DC

## 2012-08-19 ENCOUNTER — Telehealth: Payer: Self-pay | Admitting: Family Medicine

## 2012-08-19 DIAGNOSIS — I442 Atrioventricular block, complete: Secondary | ICD-10-CM | POA: Diagnosis not present

## 2012-08-19 DIAGNOSIS — I32 Pericarditis in diseases classified elsewhere: Secondary | ICD-10-CM | POA: Diagnosis not present

## 2012-08-19 DIAGNOSIS — Z4502 Encounter for adjustment and management of automatic implantable cardiac defibrillator: Secondary | ICD-10-CM | POA: Diagnosis not present

## 2012-08-19 DIAGNOSIS — I359 Nonrheumatic aortic valve disorder, unspecified: Secondary | ICD-10-CM | POA: Diagnosis not present

## 2012-08-19 NOTE — Telephone Encounter (Signed)
Med refilled.

## 2012-08-21 ENCOUNTER — Telehealth: Payer: Self-pay | Admitting: Family Medicine

## 2012-08-21 NOTE — Telephone Encounter (Signed)
This has been sent

## 2012-08-30 DIAGNOSIS — E119 Type 2 diabetes mellitus without complications: Secondary | ICD-10-CM | POA: Diagnosis not present

## 2012-08-30 DIAGNOSIS — D509 Iron deficiency anemia, unspecified: Secondary | ICD-10-CM | POA: Diagnosis not present

## 2012-08-30 DIAGNOSIS — E039 Hypothyroidism, unspecified: Secondary | ICD-10-CM | POA: Diagnosis not present

## 2012-08-30 DIAGNOSIS — E559 Vitamin D deficiency, unspecified: Secondary | ICD-10-CM | POA: Diagnosis not present

## 2012-08-30 DIAGNOSIS — E785 Hyperlipidemia, unspecified: Secondary | ICD-10-CM | POA: Diagnosis not present

## 2012-08-30 DIAGNOSIS — N183 Chronic kidney disease, stage 3 unspecified: Secondary | ICD-10-CM | POA: Diagnosis not present

## 2012-08-30 DIAGNOSIS — E1129 Type 2 diabetes mellitus with other diabetic kidney complication: Secondary | ICD-10-CM | POA: Diagnosis not present

## 2012-08-30 DIAGNOSIS — E1165 Type 2 diabetes mellitus with hyperglycemia: Secondary | ICD-10-CM | POA: Diagnosis not present

## 2012-08-30 LAB — COMPLETE METABOLIC PANEL WITH GFR
ALT: 9 U/L (ref 0–35)
AST: 25 U/L (ref 0–37)
Albumin: 4.5 g/dL (ref 3.5–5.2)
Alkaline Phosphatase: 43 U/L (ref 39–117)
BUN: 50 mg/dL — ABNORMAL HIGH (ref 6–23)
CO2: 24 mEq/L (ref 19–32)
Calcium: 10 mg/dL (ref 8.4–10.5)
Chloride: 105 mEq/L (ref 96–112)
Creat: 2.19 mg/dL — ABNORMAL HIGH (ref 0.50–1.10)
GFR, Est African American: 24 mL/min — ABNORMAL LOW
GFR, Est Non African American: 21 mL/min — ABNORMAL LOW
Glucose, Bld: 97 mg/dL (ref 70–99)
Potassium: 5.4 mEq/L — ABNORMAL HIGH (ref 3.5–5.3)
Sodium: 144 mEq/L (ref 135–145)
Total Bilirubin: 0.5 mg/dL (ref 0.3–1.2)
Total Protein: 7.7 g/dL (ref 6.0–8.3)

## 2012-08-30 LAB — LIPID PANEL
Cholesterol: 134 mg/dL (ref 0–200)
HDL: 37 mg/dL — ABNORMAL LOW (ref >39)
LDL Cholesterol: 66 mg/dL (ref 0–99)
Total CHOL/HDL Ratio: 3.6 Ratio
Triglycerides: 156 mg/dL — ABNORMAL HIGH (ref <150)
VLDL: 31 mg/dL (ref 0–40)

## 2012-08-30 LAB — HEMOGLOBIN A1C
Hgb A1c MFr Bld: 6.2 % — ABNORMAL HIGH (ref <5.7)
Mean Plasma Glucose: 131 mg/dL — ABNORMAL HIGH (ref <117)

## 2012-08-30 LAB — TSH: TSH: 1.199 u[IU]/mL (ref 0.350–4.500)

## 2012-09-02 ENCOUNTER — Ambulatory Visit (INDEPENDENT_AMBULATORY_CARE_PROVIDER_SITE_OTHER): Payer: Medicare Other | Admitting: Family Medicine

## 2012-09-02 ENCOUNTER — Encounter: Payer: Self-pay | Admitting: Family Medicine

## 2012-09-02 VITALS — BP 128/68 | HR 78 | Resp 18 | Ht 66.0 in | Wt 226.0 lb

## 2012-09-02 DIAGNOSIS — R110 Nausea: Secondary | ICD-10-CM

## 2012-09-02 DIAGNOSIS — Z0000 Encounter for general adult medical examination without abnormal findings: Secondary | ICD-10-CM

## 2012-09-02 DIAGNOSIS — E785 Hyperlipidemia, unspecified: Secondary | ICD-10-CM

## 2012-09-02 DIAGNOSIS — R51 Headache: Secondary | ICD-10-CM

## 2012-09-02 DIAGNOSIS — M479 Spondylosis, unspecified: Secondary | ICD-10-CM | POA: Diagnosis not present

## 2012-09-02 DIAGNOSIS — E119 Type 2 diabetes mellitus without complications: Secondary | ICD-10-CM

## 2012-09-02 DIAGNOSIS — R040 Epistaxis: Secondary | ICD-10-CM

## 2012-09-02 DIAGNOSIS — H9202 Otalgia, left ear: Secondary | ICD-10-CM

## 2012-09-02 DIAGNOSIS — E039 Hypothyroidism, unspecified: Secondary | ICD-10-CM

## 2012-09-02 DIAGNOSIS — Z23 Encounter for immunization: Secondary | ICD-10-CM

## 2012-09-02 MED ORDER — ONDANSETRON HCL 4 MG/2ML IJ SOLN
4.0000 mg | Freq: Once | INTRAMUSCULAR | Status: AC
  Administered 2012-09-02: 4 mg via INTRAMUSCULAR

## 2012-09-02 MED ORDER — TEMAZEPAM 30 MG PO CAPS: 30.0000 mg | ORAL_CAPSULE | Freq: Every evening | ORAL | Status: DC | PRN

## 2012-09-02 NOTE — Patient Instructions (Addendum)
F/u in 3.5 month  You are referred for head CT scan and to see Dr Benjamine Mola re headache, ear pain and nose bleed   STOP glipizide, also no need for blood sugar testing, please continue to follow a low carb diet.  Dose of fentanyl reduced since you became sleepy .  HBA1C, TSH and chem 7 and EGFr in 4 month  Zofran 4mg  for nausea today  Please check your pharmacy for shingles vaccine

## 2012-09-02 NOTE — Progress Notes (Signed)
Subjective:    Patient ID: [name] [name], female    DOB: 26-Dec-1931, 78 y.o.   MRN: [id number]  HPI 1 month h/o uncontrolled left headache, intermittent right epistaxis for years esp in Winter, left ear fullness x 1 month, place spinning at times with  No relationship to activity. Denies any recent fever or chills, sinus pressure, nasal drainage sore throat or cough As far as pain management is concerned the fentanyl patch at 52 made her too sleepy so she is requesting a dose reduction  Preventive Screening-Counseling & Management   Patient present here today for a Medicare annual wellness visit. She does have specific concerns also as described above   Current Problems (verified)   Medications Prior to Visit Allergies (verified)   PAST HISTORY  Family History : 1 Brother living who has CAd,mdiabetes , HTN and hyperlipidemia  Social History Widow x 25 years, no children. 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 Nicotine quit in 1982, alcohol  Intermittent , none now, no street drugs   Risk Factors  Current exercise habits:poor, significant spine and joint disease limits mobility    Dietary issues discussed:low carb, low fat diet discussed   Cardiac risk factors: Established valvular heart disease with defibrillator  Depression Screen  (Note: if answer to either of the following is "Yes", a more complete depression screening is indicated)   Over the past two weeks, have you felt down, depressed or hopeless? No  Over the past two weeks, have you felt little interest or pleasure in doing things? No  Have you lost interest or pleasure in daily life? No  Do you often feel hopeless? No  Do you cry easily over simple problems? No   Activities of Daily Living  In your present state of health, do you have any difficulty performing the following activities?  Driving?: never a driver Managing money?: No Feeding yourself?:No Getting from bed to chair?:yes Climbing a flight of  stairs?:yes Preparing food and eating?:No Bathing or showering?:No Getting dressed?:No Getting to the toilet?:No Using the toilet?:No Moving around from place to place?: yes uses a cane  Fall Risk Assessment In the past year have you fallen or had a near fall?:No, ambulates with cane Are you currently taking any medications that make you dizziness?:sleepy with pain patch   Hearing Difficulties: No Do you often ask people to speak up or repeat themselves?:No Do you experience ringing or noises in your ears?:No Do you have difficulty understanding soft or whispered voices?:No  Cognitive Testing  Alert? Yes Normal Appearance?Yes  Oriented to person? Yes Place? Yes  Time? Yes  Displays appropriate judgment?Yes  Can read the correct time from a watch face? yes Are you having problems remembering things?No  Advanced Directives have been discussed with the patient?Yes    List the Names of Other Physician/Practitioners you currently use: [facility]     Assessment:    Annual Wellness Exam   Plan:    During the course of the visit the patient was educated and counseled about appropriate screening and preventive services including:  A healthy diet is rich in fruit, vegetables and whole grains. Poultry fish, nuts and beans are a healthy choice for protein rather then red meat. A low sodium diet and drinking 64 ounces of water daily is generally recommended. Oils and sweet should be limited. Carbohydrates especially for those who are diabetic or overweight, should be limited to 30-45 gram per meal. It is important to eat on a regular schedule,  at least 3 times daily. Snacks should be primarily fruits, vegetables or nuts. It is important that you exercise regularly at least 30 minutes 5 times a week. If you develop chest pain, have severe difficulty breathing, or feel very tired, stop exercising immediately and seek medical attention  Immunization reviewed and  updated. Cancer screening reviewed and updated    Patient Instructions (the written plan) was given to the patient.  Medicare Attestation  I have personally reviewed:  The patient's medical and social history  Their use of alcohol, tobacco or illicit drugs  Their current medications and supplements  The patient's functional ability including ADLs,fall risks, home safety risks, cognitive, and hearing and visual impairment  Diet and physical activities  Evidence for depression or mood disorders  The patient's weight, height, BMI, and visual acuity have been recorded in the chart. I have made referrals, counseling, and provided education to the patient based on review of the above and I have provided the patient with a written personalized care plan for preventive services.      Review of Systems     Objective:   Physical Exam Patient alert and oriented and in no cardiopulmonary distress.Ill appearing  HEENT: No facial asymmetry, EOMI, no sinus tenderness,  oropharynx pink and moist.  Neck decreased ROM, no adenopathy.  Chest: Clear to auscultation bilaterally.  CVS: S1, S2 systolic  murmur, no S3.  ABD: Soft non tender. Bowel sounds normal.  Ext: No edema  MS: decreased ROM spine, shoulders, hips and knees.  Skin: Intact, no ulcerations or rash noted.  Psych: Good eye contact, normal affect. Memory intact not anxious or depressed appearing.  CNS: CN 2-12 intact, power, tone  normal throughout.        Assessment & Plan:

## 2012-09-04 ENCOUNTER — Ambulatory Visit (HOSPITAL_COMMUNITY)
Admission: RE | Admit: 2012-09-04 | Discharge: 2012-09-04 | Disposition: A | Discharge status: Home / Self Care | Payer: Medicare Other | Source: Ambulatory Visit | Attending: Family Medicine | Admitting: Family Medicine

## 2012-09-04 DIAGNOSIS — R51 Headache: Secondary | ICD-10-CM | POA: Diagnosis not present

## 2012-09-04 DIAGNOSIS — G319 Degenerative disease of nervous system, unspecified: Secondary | ICD-10-CM | POA: Insufficient documentation

## 2012-09-04 DIAGNOSIS — E559 Vitamin D deficiency, unspecified: Secondary | ICD-10-CM | POA: Diagnosis not present

## 2012-09-04 DIAGNOSIS — N184 Chronic kidney disease, stage 4 (severe): Secondary | ICD-10-CM | POA: Diagnosis not present

## 2012-09-04 DIAGNOSIS — I10 Essential (primary) hypertension: Secondary | ICD-10-CM | POA: Diagnosis not present

## 2012-09-04 DIAGNOSIS — D649 Anemia, unspecified: Secondary | ICD-10-CM | POA: Diagnosis not present

## 2012-09-05 ENCOUNTER — Ambulatory Visit (INDEPENDENT_AMBULATORY_CARE_PROVIDER_SITE_OTHER): Payer: Medicare Other | Admitting: Otolaryngology

## 2012-09-05 DIAGNOSIS — H6980 Other specified disorders of Eustachian tube, unspecified ear: Secondary | ICD-10-CM

## 2012-09-05 DIAGNOSIS — I359 Nonrheumatic aortic valve disorder, unspecified: Secondary | ICD-10-CM | POA: Diagnosis not present

## 2012-09-06 ENCOUNTER — Other Ambulatory Visit: Payer: Self-pay | Admitting: *Deleted

## 2012-09-11 ENCOUNTER — Encounter (HOSPITAL_COMMUNITY): Payer: Medicare Other | Attending: Oncology

## 2012-09-11 DIAGNOSIS — D509 Iron deficiency anemia, unspecified: Secondary | ICD-10-CM | POA: Insufficient documentation

## 2012-09-11 DIAGNOSIS — D599 Acquired hemolytic anemia, unspecified: Secondary | ICD-10-CM | POA: Diagnosis not present

## 2012-09-11 DIAGNOSIS — R5381 Other malaise: Secondary | ICD-10-CM | POA: Diagnosis not present

## 2012-09-11 DIAGNOSIS — E119 Type 2 diabetes mellitus without complications: Secondary | ICD-10-CM | POA: Diagnosis not present

## 2012-09-11 DIAGNOSIS — Z79899 Other long term (current) drug therapy: Secondary | ICD-10-CM | POA: Diagnosis not present

## 2012-09-11 DIAGNOSIS — D649 Anemia, unspecified: Secondary | ICD-10-CM | POA: Diagnosis not present

## 2012-09-11 DIAGNOSIS — E7800 Pure hypercholesterolemia, unspecified: Secondary | ICD-10-CM | POA: Insufficient documentation

## 2012-09-11 DIAGNOSIS — D539 Nutritional anemia, unspecified: Secondary | ICD-10-CM | POA: Diagnosis not present

## 2012-09-11 DIAGNOSIS — R5383 Other fatigue: Secondary | ICD-10-CM | POA: Diagnosis not present

## 2012-09-11 DIAGNOSIS — E782 Mixed hyperlipidemia: Secondary | ICD-10-CM | POA: Diagnosis not present

## 2012-09-11 DIAGNOSIS — D594 Other nonautoimmune hemolytic anemias: Secondary | ICD-10-CM

## 2012-09-11 DIAGNOSIS — M109 Gout, unspecified: Secondary | ICD-10-CM | POA: Diagnosis not present

## 2012-09-11 LAB — FERRITIN: Ferritin: 996 ng/mL — ABNORMAL HIGH (ref 10–291)

## 2012-09-11 LAB — CBC
HCT: 34.8 % — ABNORMAL LOW (ref 36.0–46.0)
Hemoglobin: 11.2 g/dL — ABNORMAL LOW (ref 12.0–15.0)
MCH: 29.6 pg (ref 26.0–34.0)
MCHC: 32.2 g/dL (ref 30.0–36.0)
MCV: 92.1 fL (ref 78.0–100.0)
Platelets: 259 10*3/uL (ref 150–400)
RBC: 3.78 MIL/uL — ABNORMAL LOW (ref 3.87–5.11)
RDW: 15.2 % (ref 11.5–15.5)
WBC: 4.9 10*3/uL (ref 4.0–10.5)

## 2012-09-11 LAB — RETICULOCYTES
RBC.: 3.78 MIL/uL — ABNORMAL LOW (ref 3.87–5.11)
Retic Count, Absolute: 71.8 10*3/uL (ref 19.0–186.0)
Retic Ct Pct: 1.9 % (ref 0.4–3.1)

## 2012-09-11 LAB — HAPTOGLOBIN: Haptoglobin: <25 mg/dL — ABNORMAL LOW (ref 45–215)

## 2012-09-11 LAB — LACTATE DEHYDROGENASE: LDH: 270 U/L — ABNORMAL HIGH (ref 94–250)

## 2012-09-11 NOTE — Progress Notes (Signed)
Labs drawn today for cbc,retic,ldh,ferr,haptoglobin

## 2012-09-13 ENCOUNTER — Ambulatory Visit (HOSPITAL_COMMUNITY): Payer: Medicare Other | Admitting: Oncology

## 2012-09-13 ENCOUNTER — Other Ambulatory Visit: Payer: Self-pay

## 2012-09-13 DIAGNOSIS — M25569 Pain in unspecified knee: Secondary | ICD-10-CM | POA: Diagnosis not present

## 2012-09-13 DIAGNOSIS — M479 Spondylosis, unspecified: Secondary | ICD-10-CM

## 2012-09-13 MED ORDER — FENTANYL 25 MCG/HR TD PT72: MEDICATED_PATCH | TRANSDERMAL | Status: DC

## 2012-09-15 IMAGING — CT CT L SPINE W/O CM
3 of 9 series · 12 of 35 positions shown, 14 images · non-contrast
Comparison: 04/26/2009

CLINICAL DATA: Right hip and low back pain

CT LUMBAR SPINE WITHOUT CONTRAST
TECHNIQUE: Multidetector CT imaging of the lumbar spine was
performed without intravenous contrast administration. Multiplanar
CT image reconstructions were also generated.

[Series 4: lumbar spine 2.0 b30s · axial · 0.35mm/px · z∈[-268,-112]mm · 4 of 110 slices shown, 5 images]
[im 16/110  soft-tissue]
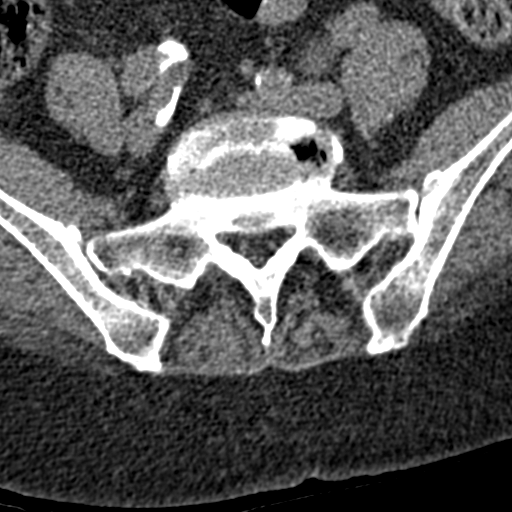
[im 16/110  bone]
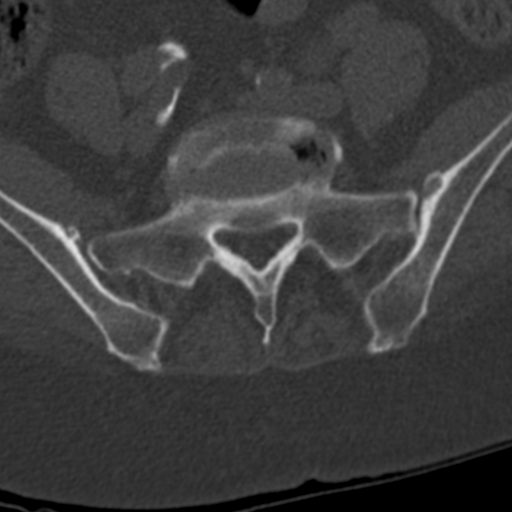
[im 47/110  bone]
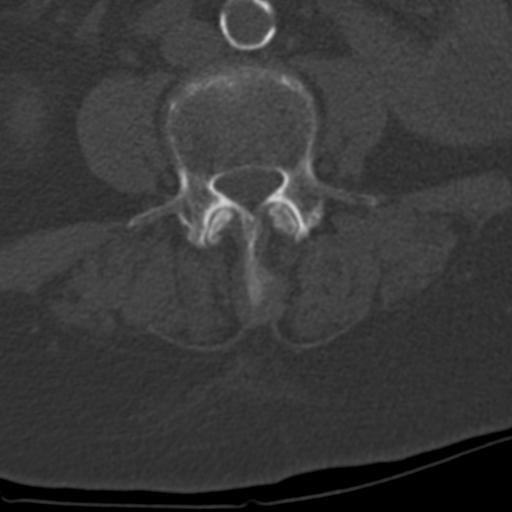
[im 63/110  bone]
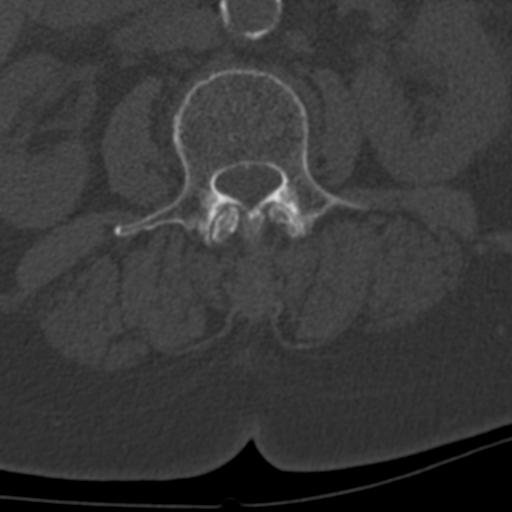
[im 94/110  bone]
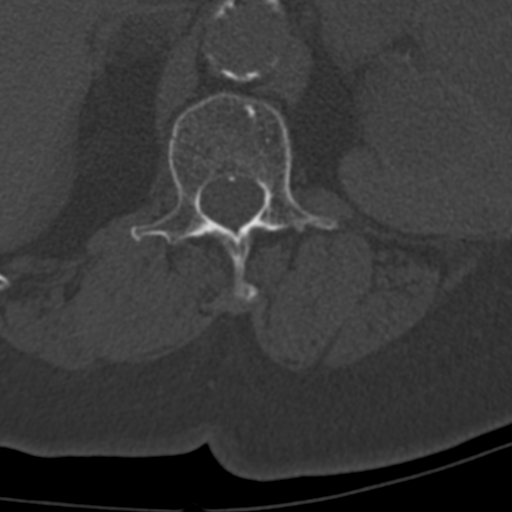

[Series 5: lumbar spine 2.0 spo · coronal · 0.33mm/px · 3 of 74 slices shown (1 of 2)]
[im 15/74  bone]
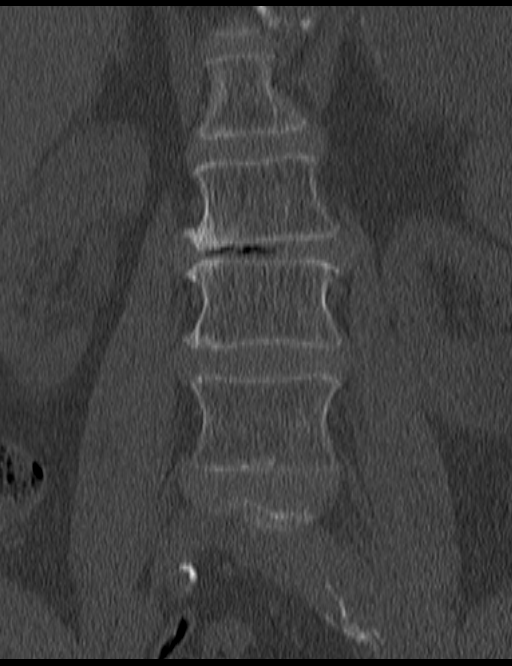
[im 30/74  bone]
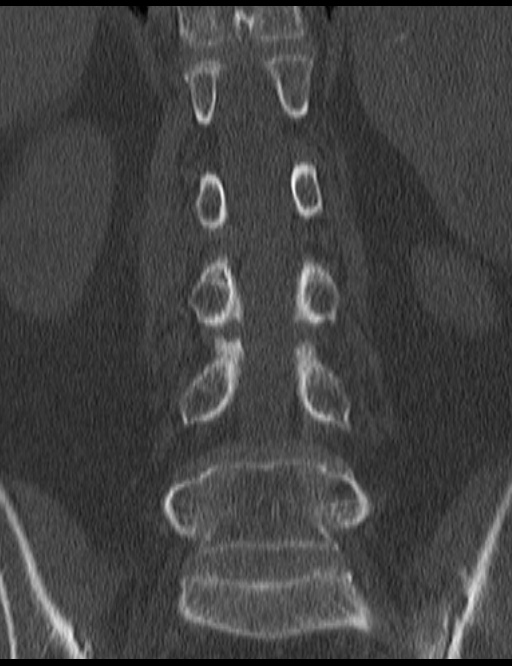
[im 44/74  bone]
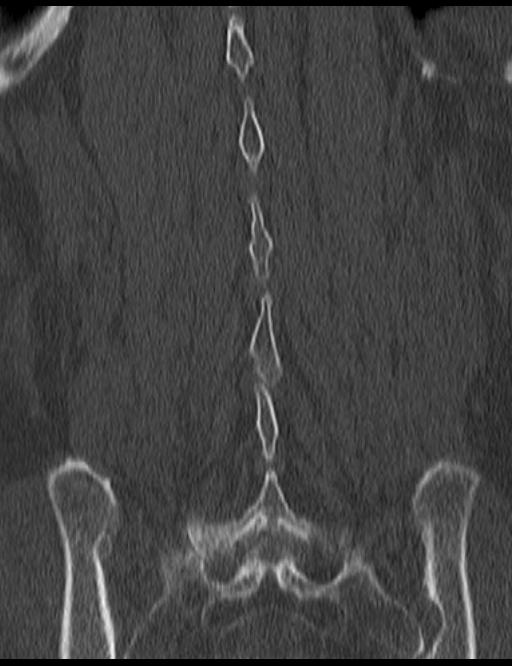

[Series 6: lumbar spine 2.0 spo · sagittal · 0.29mm/px · 5 of 72 slices shown, 6 images (2 of 2)]
[im 24/72  bone]
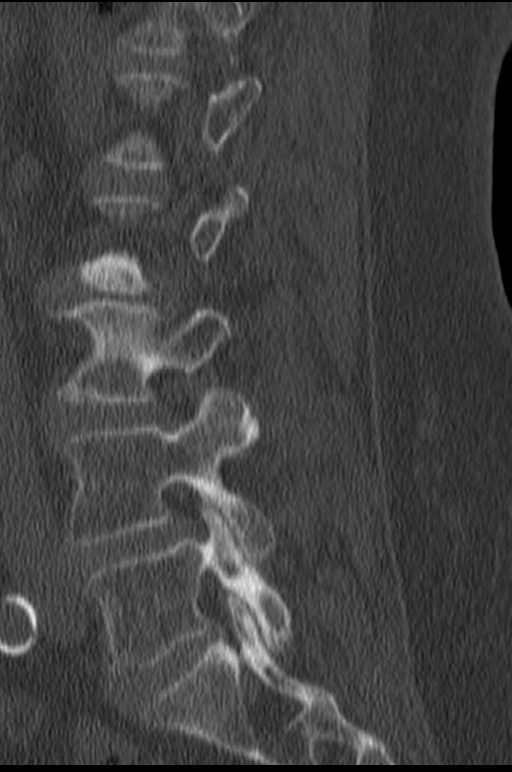
[im 30/72  bone]
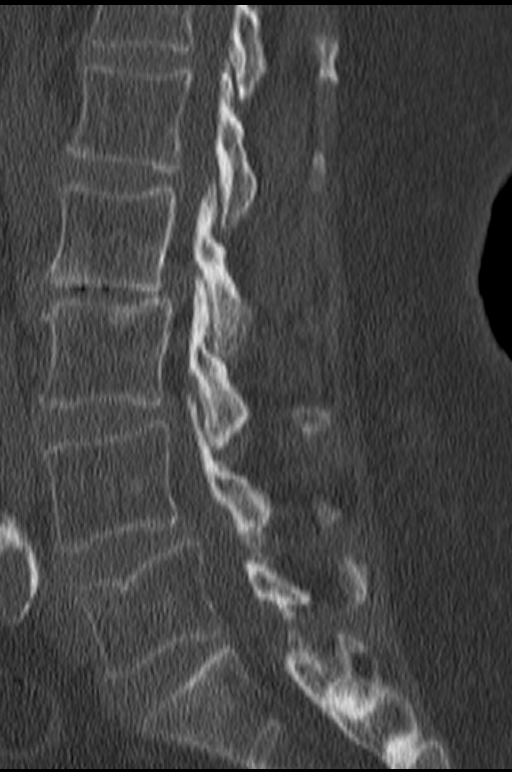
[im 36/72  soft-tissue]
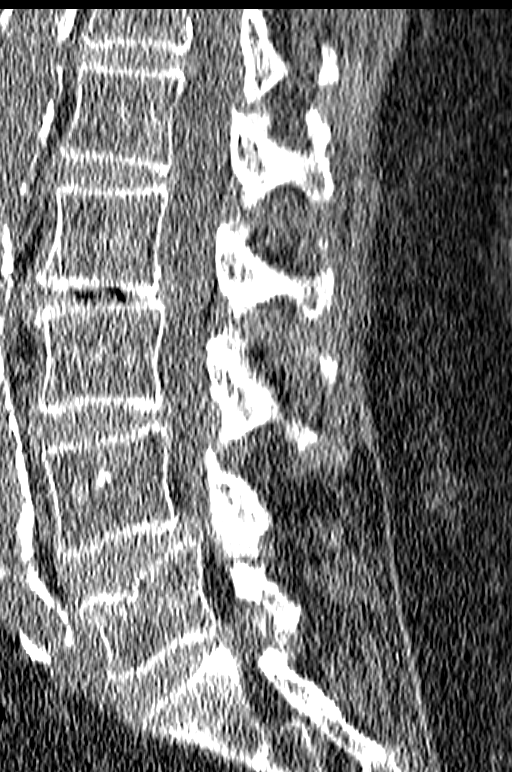
[im 36/72  bone]
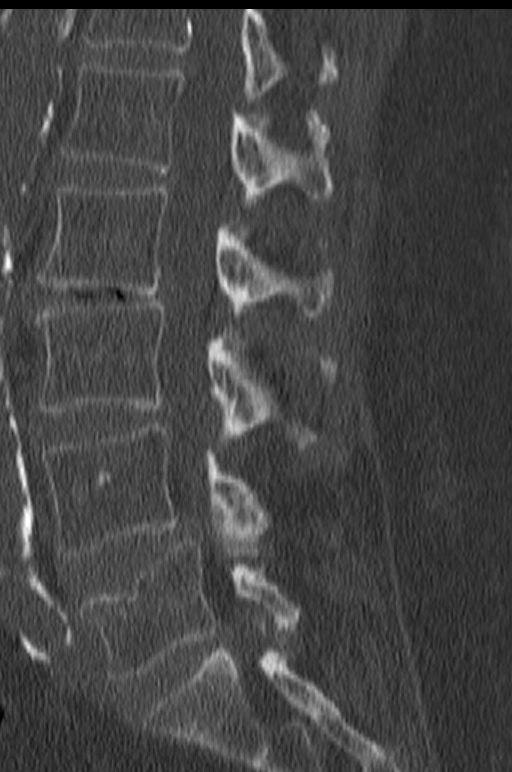
[im 42/72  bone]
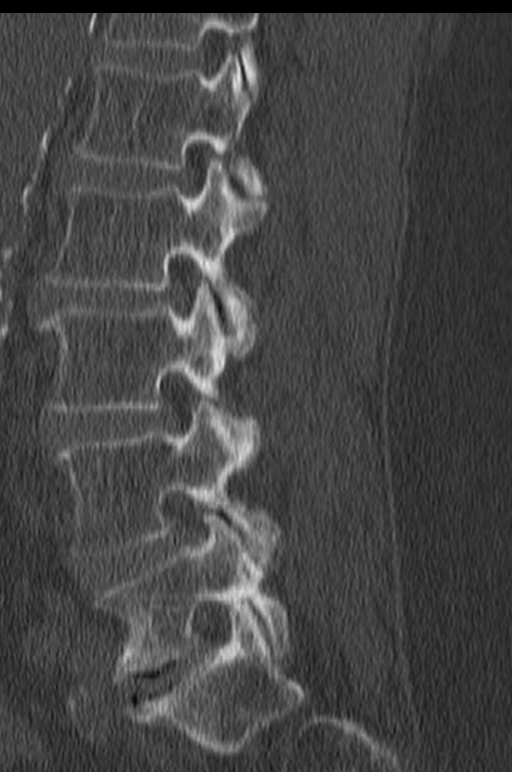
[im 48/72  bone]
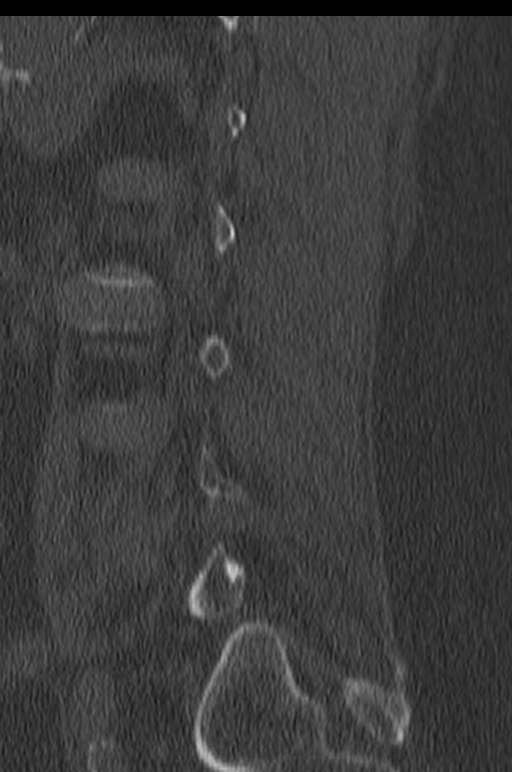

[12 of 35 positions shown; findings below may reference images not displayed]

FINDINGS: T12-L1:  Mild bulging of the disc.  Mild facet
degeneration.  No apparent stenosis.

L1-2:  Mild bulging of the disc.  Mild facet degeneration.  No
apparent stenosis.

L2-3:  Worsening of degenerative disc disease since the previous
study.  There is loss of disc height.  There is vacuum phenomenon
within the disc.  There are is more endplate sclerosis.  There is
circumferential protrusion of disc material.  There is facet
degeneration.  Multifactorial stenosis at this level could cause
neural compression.  It appears that there could be some caudally
migrated disc material on the right that would have potential to
cause focal right-sided neural compression.  I think there is
potential to affect the right L2 and/or L3 nerve roots.

L3-4:  Circumferential bulging of the disc.  Bilateral facet
degeneration.  Moderate multifactorial stenosis.

L4-5:  Bilateral facet degeneration and left worse than right.  2
mm of anterolisthesis.  Circumferential bulging of the disc.
Moderate multifactorial stenosis of the canal.  Foraminal narrowing
left more than right.  There is an old superior endplate deformity
of L5 on the left side.

L5-S1:  Bilateral facet degeneration without slippage.
Circumferential bulging of the disc.  No compressive stenosis.

Osteoarthritis of both sacroiliac joints is present.

Circumferential calcific atherosclerosis of the aorta and iliac
vessels is noted without apparent aneurysm.  The areas of cyst
formation and calcification in the spleen are noted, a chronic
finding previously studied by abdominal CT.
IMPRESSION: The significant findings are probably the L2-3 level.  There is
worsening of degenerative disc disease at that level with more loss
of disc height and development of vacuum phenomenon.  There is
circumferential protrusion of disc material with focal right
posterolateral herniation and some caudal migration.  There would
be a potential for compression of the right L2 and/or L3 nerve
roots based on the findings at this level.

Moderate multifactorial stenosis at L3-4 and L4-5 without definite
focal nerve root compression.  Findings at these levels could be
significant, but are less likely to relate to the patient's right
hip symptoms.

Chronic non compressive degenerative disease at L5-S1.  Chronic
bilateral sacroiliac osteoarthritis.

## 2012-09-17 DIAGNOSIS — Z0000 Encounter for general adult medical examination without abnormal findings: Secondary | ICD-10-CM | POA: Insufficient documentation

## 2012-09-17 NOTE — Assessment & Plan Note (Signed)
Annual wellness documented. Limitation due to severe arthriitis and chronic pain syndrome, no falls in past year No memory loss or uncontrolled depression. Has had end of life issues addressed at rent hospitalization

## 2012-09-17 NOTE — Assessment & Plan Note (Signed)
New uncontrolled headache will obtain scan to eval same

## 2012-09-17 NOTE — Assessment & Plan Note (Signed)
Chronic nausea, currently uncontrolled, zofran in office and continue as needed phenergan

## 2012-09-17 NOTE — Assessment & Plan Note (Signed)
Controlled with hypoglycemia, discontinue meds diet controlled only

## 2012-09-17 NOTE — Assessment & Plan Note (Signed)
Controlled, no change in medication  

## 2012-09-17 NOTE — Assessment & Plan Note (Signed)
Chronic pain syndrome, over corrected on fentanyl 39mcg, dose reduced

## 2012-09-24 ENCOUNTER — Encounter (HOSPITAL_BASED_OUTPATIENT_CLINIC_OR_DEPARTMENT_OTHER): Payer: Medicare Other | Admitting: Oncology

## 2012-09-24 ENCOUNTER — Encounter (HOSPITAL_COMMUNITY): Payer: Self-pay | Admitting: Oncology

## 2012-09-24 VITALS — BP 128/76 | HR 86 | Temp 97.8°F | Resp 20 | Wt 220.0 lb

## 2012-09-24 DIAGNOSIS — D509 Iron deficiency anemia, unspecified: Secondary | ICD-10-CM | POA: Diagnosis not present

## 2012-09-24 DIAGNOSIS — D638 Anemia in other chronic diseases classified elsewhere: Secondary | ICD-10-CM

## 2012-09-24 DIAGNOSIS — D589 Hereditary hemolytic anemia, unspecified: Secondary | ICD-10-CM | POA: Insufficient documentation

## 2012-09-24 DIAGNOSIS — E119 Type 2 diabetes mellitus without complications: Secondary | ICD-10-CM | POA: Diagnosis not present

## 2012-09-24 DIAGNOSIS — D594 Other nonautoimmune hemolytic anemias: Secondary | ICD-10-CM

## 2012-09-24 DIAGNOSIS — E611 Iron deficiency: Secondary | ICD-10-CM

## 2012-09-24 DIAGNOSIS — IMO0002 Reserved for concepts with insufficient information to code with codable children: Secondary | ICD-10-CM

## 2012-09-24 NOTE — Patient Instructions (Addendum)
[facility] Discharge Instructions  RECOMMENDATIONS MADE BY THE CONSULTANT AND ANY TEST RESULTS WILL BE SENT TO YOUR REFERRING PHYSICIAN.  EXAM FINDINGS BY THE PHYSICIAN TODAY AND SIGNS OR SYMPTOMS TO REPORT TO CLINIC OR PRIMARY PHYSICIAN: exam and discussion by PA.  You are doing well.  Report increased fatigue or shortness of breath.  MEDICATIONS PRESCRIBED:  none    SPECIAL INSTRUCTIONS/FOLLOW-UP: Blood work in 6 months and to be seen in follow-up after labs in 6 months.  Thank you for choosing [facility] to provide your oncology and hematology care.  To afford each patient quality time with our providers, please arrive at least 15 minutes before your scheduled appointment time.  With your help, our goal is to use those 15 minutes to complete the necessary work-up to ensure our physicians have the information they need to help with your evaluation and healthcare recommendations.    Effective January 1st, 2014, we ask that you re-schedule your appointment with our physicians should you arrive 10 or more minutes late for your appointment.  We strive to give you quality time with our providers, and arriving late affects you and other patients whose appointments are after yours.    Again, thank you for choosing [facility].  Our hope is that these requests will decrease the amount of time that you wait before being seen by our physicians.       _____________________________________________________________  I acknowledge that I have been informed and understand all the instructions given to me and received a copy. I do not have anymore questions at this time but understand that I may call the [place] at [facility] at [phone] during business hours should I have any further questions or need assistance in obtaining follow-up care.

## 2012-09-24 NOTE — Progress Notes (Signed)
[name] Nakayama, MD [address], [place] [address]  1. Iron deficiency   2. Hemolysis     CURRENT THERAPY: Observation  INTERVAL HISTORY: [name] [name] 78 y.o. 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   [name] [name] had a little scare with her mammogram in June 2013 that was subsequently follow-up with [place].  The ultrasound on 02/28/2012 showed an intraductal soft tissue nodule in the subareolar region of the left breast measuring maximal diameter of 5 mm. A stalk feeding this nodule is again identified. It is stable from the prior ultrasounds.  Stable size and appearance of a probable intraductal papilloma. No new areas of concern are identified in either breast to suggest malignancy.  So with this negative work-up, she will continue mammogram and [place] follow-up as indicated.   Otherwise, she reports some thinning of her hair.  Her TSH is stable.  I recommended she follow-up with her PCP.  She thought that she was losing her hair because she had cancer.  I quickly rectified that misconception and reviewed the above mentioned information with her.   I personally reviewed and went over laboratory results with the patient.  Her labs are very stable.  Her ferritin is not low, so she is not in need of IV iron.  Otherwise, she is doing great.  Complete ROS questioning is negative.   Past Medical History  Diagnosis Date  . DJD (degenerative joint disease) of lumbar spine   . Chronic kidney disease, stage I   . Esophageal reflux   . Other and unspecified hyperlipidemia   . Other primary cardiomyopathies   . Obesity, unspecified   . Unspecified hypothyroidism   . DM (diabetes mellitus)   . Essential hypertension, benign   . 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 Pain in joint, pelvic region and thigh   . Insomnia, unspecified   . Iron  deficiency 10/04/2011  . IDA (iron deficiency anemia)     Parenteral iron/[name]  . Cancer   . CHF (congestive heart failure)   . COPD (chronic obstructive pulmonary disease)   . Heart murmur   . ICD (implantable cardiac defibrillator) in place   . Adenomatous polyp of colon 12/16/2002    Dr. [name]/[facility]  . Hyperlipemia   . Hemolysis 09/24/2012    has HYPOTHYROIDISM; DIABETES MELLITUS, TYPE II; HYPERLIPIDEMIA; OBESITY; HYPERTENSION - with Cardiac Abnormalities; Non-ischemic cardiomyopathy with ICD; UNSPECIFIED CARDIOVASCULAR DISEASE; VARICOSE VEINS LOWER EXTREMITIES W/OTH COMPS; GERD; CHOLELITHIASIS; CKD (chronic kidney disease) stage 4, GFR 15-29 ml/min, now improved to CKD stage 3; OTHER SIGN AND SYMPTOM IN BREAST; HIP PAIN, RIGHT; DEGENERATIVE JOINT DISEASE, SPINE; INSOMNIA; NAUSEA; NECK SPASM; HEADACHE; Allergic rhinitis; Low back pain; Iron deficiency; Papilloma of breast; Warfarin-induced coagulopathy; Cardiorenal syndrome with renal failure; S/P AVR (aortic valve replacement); S/P MVR (mitral valve replacement); AICD (automatic cardioverter/defibrillator) present; CKD (chronic kidney disease), stage III - due to DM, HTN and cardiorenal syndrome; Chronic anticoagulation, on coumadin for Mechanical AVR and MVR; Upper leg pain, rt. from use of BSC?  ; COPD (chronic obstructive pulmonary disease); Chronic systolic heart failure; Right thigh pain; H/O adenomatous polyp of colon; High risk medication use; Cystitis, acute; Otalgia of left ear; Routine general medical examination at a health care facility; and Hemolysis on her problem list.     is allergic to niacin and  penicillins.  [name] [name] had no medications administered during this visit.  Past Surgical History  Procedure Date  . Thyroidectomy   . Pacemaker placed 2004  . Aortic and mitral valve replacement 2001  . Right breast cyst removed     benign   . Right cataract removed     2005  .  Defibrillator implanted 2006   . Cardiac valve replacement   . Cardiac catheterization   . Insert / replace / remove pacemaker   . Esophagogastroduodenoscopy 06/12/2007    Rourk- normal esophagus, small hiatal hernia, otherwise normal stomach, D1, D2  . Colonoscopy 06/12/2007    Rourk- Normal rectum/Normal colon  . Colonoscopy 12/16/02    small hemorrhoids  . Colonoscopy 06/20/2012    Procedure: COLONOSCOPY;  Surgeon: Daneil Dolin, MD;  Location: [facility];  Service: Endoscopy;  Laterality: N/A;  10:45    Denies any headaches, dizziness, double vision, fevers, chills, night sweats, nausea, vomiting, diarrhea, constipation, chest pain, heart palpitations, shortness of breath, blood in stool, black tarry stool, urinary pain, urinary burning, urinary frequency, hematuria.   PHYSICAL EXAMINATION  ECOG PERFORMANCE STATUS: 0 - Asymptomatic  Filed Vitals:   09/24/12 1100  BP: 128/76  Pulse: 86  Temp: 97.8 F (36.6 C)  Resp: 20    GENERAL:alert, no distress, well nourished, well developed, comfortable, cooperative, obese and smiling SKIN: skin color, texture, turgor are normal, no rashes or significant lesions HEAD: Normocephalic, No masses, lesions, tenderness or abnormalities EYES: normal, Conjunctiva are pink and non-injected EARS: External ears normal OROPHARYNX:mucous membranes are moist  NECK: supple, no adenopathy, thyroid normal size, non-tender, without nodularity, no stridor, non-tender, trachea midline LYMPH:  no palpable lymphadenopathy BREAST:not examined LUNGS: clear to auscultation and percussion HEART: regular rate & rhythm, click of valves noted, no gallops, S1 normal and S2 normal ABDOMEN:abdomen soft, non-tender, obese and normal bowel sounds BACK: Back symmetric, no curvature., No CVA tenderness EXTREMITIES:less then 2 second capillary refill, no joint deformities, effusion, or inflammation, no edema, no skin discoloration, no clubbing, no cyanosis  NEURO:  alert & oriented x 3 with fluent speech, no focal motor/sensory deficits, gait normal   LABORATORY DATA: CBC    Component Value Date/Time   WBC 4.9 09/11/2012 0945   RBC 3.78* 09/11/2012 0945   HGB 11.2* 09/11/2012 0945   HCT 34.8* 09/11/2012 0945   PLT 259 09/11/2012 0945   MCV 92.1 09/11/2012 0945   MCH 29.6 09/11/2012 0945   MCHC 32.2 09/11/2012 0945   RDW 15.2 09/11/2012 0945   LYMPHSABS 0.4* 03/22/2012 0607   MONOABS 0.5 03/22/2012 0607   EOSABS 0.0 03/22/2012 0607   BASOSABS 0.0 03/22/2012 0607      Chemistry      Component Value Date/Time   NA 144 08/30/2012 1025   K 5.4* 08/30/2012 1025   CL 105 08/30/2012 1025   CO2 24 08/30/2012 1025   BUN 50* 08/30/2012 1025   CREATININE 2.19* 08/30/2012 1025   CREATININE 2.01* 04/02/2012 0515      Component Value Date/Time   CALCIUM 10.0 08/30/2012 1025   CALCIUM 9.5 06/12/2010 0459   ALKPHOS 43 08/30/2012 1025   AST 25 08/30/2012 1025   ALT 9 08/30/2012 1025   BILITOT 0.5 08/30/2012 1025     Lab Results  Component Value Date   FERRITIN 996* 09/11/2012     RADIOGRAPHIC STUDIES:  02/28/2012  *RADIOLOGY REPORT*  Clinical Data: History of a probable papilloma in the subareolar  region of the left  breast. Surgery was recommended but due to  cardiac issues, the patient is felt not to be a surgical candidate.  Followup study. The patient has no current complaints.  DIGITAL DIAGNOSTIC BILATERAL MAMMOGRAM WITH CAD  Comparison: With priors  Findings: There are scattered fibroglandular densities. There is  no new suspicious mass or malignant-type microcalcifications in  either breast. A pacemaker projects over the left breast  superiorly.  Mammographic images were processed with CAD.  On physical exam, I do not palpate a mass in the left breast.  Ultrasound is performed, showing an intraductal soft tissue nodule  in the subareolar region of the left breast measuring maximal  diameter of 5 mm. A stalk feeding this nodule is again  identified.  It is stable from the prior ultrasounds.  IMPRESSION:  Stable size and appearance of a probable intraductal papilloma. No  new areas of concern are identified in either breast to suggest  malignancy.  The patient has previously seen Dr. Margot Chimes for surgical  consultation. The patient is not a surgical candidate. Bilateral  diagnostic mammogram and left breast ultrasound in 1 year is  recommended for follow up, unless new areas of concern warrant  earlier evaluation.  BI-RADS CATEGORY 4: Suspicious abnormality - biopsy should be  considered.  Original Report Authenticated By: Jeanie Cooks. ARCEO, M.D.     ASSESSMENT:  1. 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  2. Anemia of chronic disease element  3. Iron deficiency  4. Diabetes mellitus under great control  5. 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 Hypercholesterolemia  7. Tubal resection in her 65  8. 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 Nipple discharge in the past felt to be benign Dr. Osborn Coho    PLAN:  1. I personally reviewed and went over laboratory results with the patient. 2. Labs in 6 months: CBC diff, Retic count, Iron/TIBC, Ferritin 3. Mammogram as recommended per radiology 4. Return in 6 months for follow-up.    All questions were answered. The patient knows to call the clinic with any problems, questions or concerns. We can certainly see the patient much sooner if necessary.  The patient and plan discussed with Everardo All, MD and he is in agreement with the aforementioned.   KEFALAS,THOMAS

## 2012-09-25 DIAGNOSIS — R0902 Hypoxemia: Secondary | ICD-10-CM

## 2012-09-25 HISTORY — DX: Hypoxemia: R09.02

## 2012-09-30 DIAGNOSIS — I359 Nonrheumatic aortic valve disorder, unspecified: Secondary | ICD-10-CM | POA: Diagnosis not present

## 2012-10-02 ENCOUNTER — Telehealth: Payer: Self-pay | Admitting: Family Medicine

## 2012-10-02 MED ORDER — LANOXIN 125 MCG PO TABS: 125.0000 ug | ORAL_TABLET | Freq: Every day | ORAL | Status: DC

## 2012-10-02 NOTE — Telephone Encounter (Signed)
Refill sent in

## 2012-10-03 ENCOUNTER — Ambulatory Visit (INDEPENDENT_AMBULATORY_CARE_PROVIDER_SITE_OTHER): Payer: PRIVATE HEALTH INSURANCE | Admitting: Otolaryngology

## 2012-10-03 DIAGNOSIS — R490 Dysphonia: Secondary | ICD-10-CM | POA: Diagnosis not present

## 2012-10-03 DIAGNOSIS — H903 Sensorineural hearing loss, bilateral: Secondary | ICD-10-CM | POA: Diagnosis not present

## 2012-10-03 DIAGNOSIS — J310 Chronic rhinitis: Secondary | ICD-10-CM | POA: Diagnosis not present

## 2012-10-03 DIAGNOSIS — R0982 Postnasal drip: Secondary | ICD-10-CM

## 2012-10-09 ENCOUNTER — Telehealth: Payer: Self-pay | Admitting: Family Medicine

## 2012-10-09 NOTE — Telephone Encounter (Signed)
Weak and sore, been going on for about a week. Especially when she gets up in the am around her waistline. Doesn't know whats causing it and wants to know if she can have something for it

## 2012-10-09 NOTE — Telephone Encounter (Signed)
Is there a rash where she is sore?? If the soreness goers away afwer a  Short time then I wouldn't worry too much. She may try tylenol 1 tablet in the pm , for next 3 days see if relieves morning symptoms Can she provide anymore history as to what exactly the symptoms are???

## 2012-10-10 ENCOUNTER — Other Ambulatory Visit: Payer: Self-pay | Admitting: Family Medicine

## 2012-10-10 NOTE — Telephone Encounter (Signed)
NO RASH. PT ADVISED AND WILL COME IN IF NO BETTER IN A FEW DAYS

## 2012-10-16 ENCOUNTER — Ambulatory Visit (INDEPENDENT_AMBULATORY_CARE_PROVIDER_SITE_OTHER): Payer: Medicare Other | Admitting: Family Medicine

## 2012-10-16 ENCOUNTER — Encounter: Payer: Self-pay | Admitting: Family Medicine

## 2012-10-16 VITALS — BP 132/60 | HR 85 | Resp 18 | Ht 66.0 in | Wt 226.0 lb

## 2012-10-16 DIAGNOSIS — E669 Obesity, unspecified: Secondary | ICD-10-CM

## 2012-10-16 DIAGNOSIS — E785 Hyperlipidemia, unspecified: Secondary | ICD-10-CM

## 2012-10-16 DIAGNOSIS — R109 Unspecified abdominal pain: Secondary | ICD-10-CM

## 2012-10-16 DIAGNOSIS — E119 Type 2 diabetes mellitus without complications: Secondary | ICD-10-CM | POA: Diagnosis not present

## 2012-10-16 DIAGNOSIS — N3000 Acute cystitis without hematuria: Secondary | ICD-10-CM

## 2012-10-16 DIAGNOSIS — M479 Spondylosis, unspecified: Secondary | ICD-10-CM | POA: Diagnosis not present

## 2012-10-16 DIAGNOSIS — R110 Nausea: Secondary | ICD-10-CM | POA: Diagnosis not present

## 2012-10-16 DIAGNOSIS — I10 Essential (primary) hypertension: Secondary | ICD-10-CM

## 2012-10-16 LAB — POCT URINALYSIS DIPSTICK
Bilirubin, UA: NEGATIVE
Blood, UA: NEGATIVE
Glucose, UA: NEGATIVE
Ketones, UA: NEGATIVE
Leukocytes, UA: NEGATIVE
Nitrite, UA: NEGATIVE
Protein, UA: NEGATIVE
Spec Grav, UA: 1.015
Urobilinogen, UA: 0.2
pH, UA: 6

## 2012-10-16 NOTE — Assessment & Plan Note (Signed)
Controlled, no change in medication DASH diet and commitment to daily physical activity for a minimum of 30 minutes discussed and encouraged, as a part of hypertension management. The importance of attaining a healthy weight is also discussed.  

## 2012-10-16 NOTE — Assessment & Plan Note (Addendum)
Controlled with diet only, new change, updated lab for next visit

## 2012-10-16 NOTE — Assessment & Plan Note (Signed)
Triglycerides slightly elevated, low fat die discussed and encouraged, no med change

## 2012-10-16 NOTE — Assessment & Plan Note (Signed)
Deteriorated. Patient re-educated about  the importance of commitment to a  minimum of 150 minutes of exercise per week. The importance of healthy food choices with portion control discussed. Encouraged to start a food diary, count calories and to consider  joining a support group. Sample diet sheets offered. Goals set by the patient for the next several months.    

## 2012-10-16 NOTE — Assessment & Plan Note (Signed)
cCUA in office today normal though pt reports frequency and back pain, she is reassured no infectioin

## 2012-10-16 NOTE — Patient Instructions (Addendum)
F/u as before.  I believe the pain you are having is from arthritis in your low back  Increase the gabapentin to twice daily for the next 3 to 5 days see if this helps.  The pain is not due to your gallbladder.  Urine is being checked for infection  Please check the pharmacy to get your shingles vaccine

## 2012-10-16 NOTE — Assessment & Plan Note (Signed)
Increased and uncontrolled, current pain complaint likely due to this. Pt intolerant of fentanyl, states gabapentin helps Increase gabapentin to 300mg  twice daily if tolerates and improves pain

## 2012-10-16 NOTE — Progress Notes (Signed)
  Subjective:    Patient ID: [name] [name], female    DOB: 1932/03/31, 79 y.o.   MRN: [id number]  HPI 1 week h/o low back pain radiating around right hip and flank to anterior abdomen. Denies any change in bowel movements, she denies vomiting but has chronic nausea and requests injection for this. Denies fever, chills or skin rash States the gabapentin helps her back pain but the fentanyl is too strong wants to hold off on this. C/o urinary frequency Concerned that pain is not due to gallstones   Review of Systems See HPI Denies recent fever or chills. Denies sinus pressure, nasal congestion, ear pain or sore throat. Denies chest congestion, productive cough or wheezing. Denies chest pains, palpitations and leg swelling Denies a vomiting,diarrhea or constipation.   Denies dysuria,  hesitancy or incontinence.  Denies headaches, seizures, numbness, or tingling. Denies depression, anxiety or insomnia. Denies skin break down or rash.        Objective:   Physical Exam  Patient alert and oriented and in no cardiopulmonary distress.  HEENT: No facial asymmetry, EOMI, no sinus tenderness,  oropharynx pink and moist.  Neck supple no adenopathy.  Chest: Clear to auscultation bilaterally.  CVS: S1, S2 no murmurs, no S3.  ABD: Soft mild suprapubic tenderness, no guarding or rebound. Bowel sounds normal.  Ext: No edema  MS: Decreased ROM spine, hips and knees.  Skin: Intact, no ulcerations or rash noted.  Psych: Good eye contact, normal affect. Memory intact not anxious or depressed appearing.  CNS: CN 2-12 intact, power, tone and sensation normal throughout.       Assessment & Plan:

## 2012-10-16 NOTE — Assessment & Plan Note (Signed)
Chronic nausea with cholelithiasis, pt to get zofran 4 mg at OV

## 2012-10-17 ENCOUNTER — Ambulatory Visit: Payer: Medicare Other

## 2012-10-17 DIAGNOSIS — R110 Nausea: Secondary | ICD-10-CM | POA: Diagnosis not present

## 2012-10-17 MED ORDER — ONDANSETRON HCL 4 MG/2ML IJ SOLN
4.0000 mg | Freq: Once | INTRAMUSCULAR | Status: AC
  Administered 2012-10-17: 4 mg via INTRAMUSCULAR

## 2012-10-17 NOTE — Addendum Note (Signed)
Addended by: Eual Fines on: 10/17/2012 04:58 PM   Modules accepted: Orders

## 2012-10-21 ENCOUNTER — Other Ambulatory Visit: Payer: Self-pay | Admitting: Family Medicine

## 2012-10-22 ENCOUNTER — Other Ambulatory Visit: Payer: Self-pay | Admitting: Family Medicine

## 2012-10-23 DIAGNOSIS — I359 Nonrheumatic aortic valve disorder, unspecified: Secondary | ICD-10-CM | POA: Diagnosis not present

## 2012-10-24 DIAGNOSIS — E119 Type 2 diabetes mellitus without complications: Secondary | ICD-10-CM | POA: Diagnosis not present

## 2012-10-24 DIAGNOSIS — E1149 Type 2 diabetes mellitus with other diabetic neurological complication: Secondary | ICD-10-CM | POA: Diagnosis not present

## 2012-10-29 DIAGNOSIS — H40019 Open angle with borderline findings, low risk, unspecified eye: Secondary | ICD-10-CM | POA: Diagnosis not present

## 2012-10-29 DIAGNOSIS — H35419 Lattice degeneration of retina, unspecified eye: Secondary | ICD-10-CM | POA: Diagnosis not present

## 2012-10-29 DIAGNOSIS — H43819 Vitreous degeneration, unspecified eye: Secondary | ICD-10-CM | POA: Diagnosis not present

## 2012-11-13 DIAGNOSIS — I359 Nonrheumatic aortic valve disorder, unspecified: Secondary | ICD-10-CM | POA: Diagnosis not present

## 2012-11-13 DIAGNOSIS — I32 Pericarditis in diseases classified elsewhere: Secondary | ICD-10-CM | POA: Diagnosis not present

## 2012-11-18 DIAGNOSIS — E878 Other disorders of electrolyte and fluid balance, not elsewhere classified: Secondary | ICD-10-CM | POA: Diagnosis not present

## 2012-11-18 DIAGNOSIS — I359 Nonrheumatic aortic valve disorder, unspecified: Secondary | ICD-10-CM | POA: Diagnosis not present

## 2012-11-18 DIAGNOSIS — R5383 Other fatigue: Secondary | ICD-10-CM | POA: Diagnosis not present

## 2012-11-18 DIAGNOSIS — I509 Heart failure, unspecified: Secondary | ICD-10-CM | POA: Diagnosis not present

## 2012-11-18 DIAGNOSIS — R5381 Other malaise: Secondary | ICD-10-CM | POA: Diagnosis not present

## 2012-11-18 DIAGNOSIS — D500 Iron deficiency anemia secondary to blood loss (chronic): Secondary | ICD-10-CM | POA: Diagnosis not present

## 2012-11-18 DIAGNOSIS — Z4502 Encounter for adjustment and management of automatic implantable cardiac defibrillator: Secondary | ICD-10-CM | POA: Diagnosis not present

## 2012-11-18 DIAGNOSIS — Z79899 Other long term (current) drug therapy: Secondary | ICD-10-CM | POA: Diagnosis not present

## 2012-11-18 DIAGNOSIS — T460X1A Poisoning by cardiac-stimulant glycosides and drugs of similar action, accidental (unintentional), initial encounter: Secondary | ICD-10-CM | POA: Diagnosis not present

## 2012-11-18 DIAGNOSIS — M109 Gout, unspecified: Secondary | ICD-10-CM | POA: Diagnosis not present

## 2012-12-09 ENCOUNTER — Other Ambulatory Visit (HOSPITAL_COMMUNITY): Payer: Self-pay | Admitting: Oncology

## 2012-12-09 DIAGNOSIS — E611 Iron deficiency: Secondary | ICD-10-CM

## 2012-12-10 ENCOUNTER — Encounter (HOSPITAL_COMMUNITY): Payer: Medicare Other | Attending: Oncology

## 2012-12-10 DIAGNOSIS — D509 Iron deficiency anemia, unspecified: Secondary | ICD-10-CM | POA: Diagnosis not present

## 2012-12-10 DIAGNOSIS — I359 Nonrheumatic aortic valve disorder, unspecified: Secondary | ICD-10-CM | POA: Diagnosis not present

## 2012-12-10 DIAGNOSIS — E611 Iron deficiency: Secondary | ICD-10-CM

## 2012-12-10 LAB — CBC WITH DIFFERENTIAL/PLATELET
Basophils Absolute: 0 10*3/uL (ref 0.0–0.1)
Basophils Relative: 0 % (ref 0–1)
Eosinophils Absolute: 0 10*3/uL (ref 0.0–0.7)
Eosinophils Relative: 0 % (ref 0–5)
HCT: 34.2 % — ABNORMAL LOW (ref 36.0–46.0)
Hemoglobin: 11 g/dL — ABNORMAL LOW (ref 12.0–15.0)
Lymphocytes Relative: 21 % (ref 12–46)
Lymphs Abs: 1.6 10*3/uL (ref 0.7–4.0)
MCH: 30.2 pg (ref 26.0–34.0)
MCHC: 32.2 g/dL (ref 30.0–36.0)
MCV: 94 fL (ref 78.0–100.0)
Monocytes Absolute: 0.4 10*3/uL (ref 0.1–1.0)
Monocytes Relative: 5 % (ref 3–12)
Neutro Abs: 5.6 10*3/uL (ref 1.7–7.7)
Neutrophils Relative %: 74 % (ref 43–77)
Platelets: 256 10*3/uL (ref 150–400)
RBC: 3.64 MIL/uL — ABNORMAL LOW (ref 3.87–5.11)
RDW: 14.5 % (ref 11.5–15.5)
WBC: 7.6 10*3/uL (ref 4.0–10.5)

## 2012-12-10 NOTE — Progress Notes (Signed)
Labs drawn today for cbc/diff,ferr,Iron and IBC 

## 2012-12-11 ENCOUNTER — Encounter (HOSPITAL_COMMUNITY): Payer: Self-pay

## 2012-12-11 LAB — IRON AND TIBC
Iron: 116 ug/dL (ref 42–135)
Saturation Ratios: 25 % (ref 20–55)
TIBC: 455 ug/dL (ref 250–470)
UIBC: 339 ug/dL (ref 125–400)

## 2012-12-11 LAB — FERRITIN: Ferritin: 930 ng/mL — ABNORMAL HIGH (ref 10–291)

## 2012-12-16 ENCOUNTER — Other Ambulatory Visit (HOSPITAL_COMMUNITY): Payer: Self-pay | Admitting: Oncology

## 2012-12-16 DIAGNOSIS — E611 Iron deficiency: Secondary | ICD-10-CM

## 2012-12-19 ENCOUNTER — Encounter (HOSPITAL_BASED_OUTPATIENT_CLINIC_OR_DEPARTMENT_OTHER): Payer: Medicare Other

## 2012-12-19 VITALS — BP 110/51 | HR 73 | Temp 97.7°F | Resp 18

## 2012-12-19 DIAGNOSIS — D509 Iron deficiency anemia, unspecified: Secondary | ICD-10-CM

## 2012-12-19 DIAGNOSIS — E611 Iron deficiency: Secondary | ICD-10-CM

## 2012-12-19 MED ORDER — SODIUM CHLORIDE 0.9 % IV SOLN
Freq: Once | INTRAVENOUS | Status: AC
  Administered 2012-12-19: 12:00:00 via INTRAVENOUS

## 2012-12-19 MED ORDER — SODIUM CHLORIDE 0.9 % IJ SOLN
10.0000 mL | Freq: Once | INTRAMUSCULAR | Status: DC
  Filled 2012-12-19: qty 10

## 2012-12-19 MED ORDER — FERUMOXYTOL INJECTION 510 MG/17 ML
1020.0000 mg | Freq: Once | INTRAVENOUS | Status: AC
  Administered 2012-12-19: 1020 mg via INTRAVENOUS
  Filled 2012-12-19: qty 34

## 2012-12-23 DIAGNOSIS — E559 Vitamin D deficiency, unspecified: Secondary | ICD-10-CM | POA: Diagnosis not present

## 2012-12-23 DIAGNOSIS — E039 Hypothyroidism, unspecified: Secondary | ICD-10-CM | POA: Diagnosis not present

## 2012-12-23 DIAGNOSIS — Z79899 Other long term (current) drug therapy: Secondary | ICD-10-CM | POA: Diagnosis not present

## 2012-12-23 DIAGNOSIS — I10 Essential (primary) hypertension: Secondary | ICD-10-CM | POA: Diagnosis not present

## 2012-12-23 DIAGNOSIS — N189 Chronic kidney disease, unspecified: Secondary | ICD-10-CM | POA: Diagnosis not present

## 2012-12-23 DIAGNOSIS — E119 Type 2 diabetes mellitus without complications: Secondary | ICD-10-CM | POA: Diagnosis not present

## 2012-12-23 DIAGNOSIS — R809 Proteinuria, unspecified: Secondary | ICD-10-CM | POA: Diagnosis not present

## 2012-12-24 DIAGNOSIS — D65 Disseminated intravascular coagulation [defibrination syndrome]: Secondary | ICD-10-CM | POA: Diagnosis not present

## 2012-12-24 DIAGNOSIS — I359 Nonrheumatic aortic valve disorder, unspecified: Secondary | ICD-10-CM | POA: Diagnosis not present

## 2012-12-25 ENCOUNTER — Encounter: Payer: Self-pay | Admitting: Oncology

## 2012-12-25 DIAGNOSIS — N184 Chronic kidney disease, stage 4 (severe): Secondary | ICD-10-CM | POA: Diagnosis not present

## 2012-12-25 DIAGNOSIS — I10 Essential (primary) hypertension: Secondary | ICD-10-CM | POA: Diagnosis not present

## 2012-12-25 DIAGNOSIS — D649 Anemia, unspecified: Secondary | ICD-10-CM | POA: Diagnosis not present

## 2013-01-02 ENCOUNTER — Ambulatory Visit (INDEPENDENT_AMBULATORY_CARE_PROVIDER_SITE_OTHER): Payer: Medicare Other | Admitting: Family Medicine

## 2013-01-02 ENCOUNTER — Encounter: Payer: Self-pay | Admitting: Family Medicine

## 2013-01-02 VITALS — BP 118/68 | HR 79 | Resp 16 | Ht 66.0 in | Wt 233.0 lb

## 2013-01-02 DIAGNOSIS — I10 Essential (primary) hypertension: Secondary | ICD-10-CM | POA: Diagnosis not present

## 2013-01-02 DIAGNOSIS — M25569 Pain in unspecified knee: Secondary | ICD-10-CM | POA: Diagnosis not present

## 2013-01-02 DIAGNOSIS — R110 Nausea: Secondary | ICD-10-CM | POA: Diagnosis not present

## 2013-01-02 DIAGNOSIS — G8929 Other chronic pain: Secondary | ICD-10-CM | POA: Insufficient documentation

## 2013-01-02 DIAGNOSIS — K8020 Calculus of gallbladder without cholecystitis without obstruction: Secondary | ICD-10-CM

## 2013-01-02 DIAGNOSIS — J449 Chronic obstructive pulmonary disease, unspecified: Secondary | ICD-10-CM

## 2013-01-02 DIAGNOSIS — E785 Hyperlipidemia, unspecified: Secondary | ICD-10-CM

## 2013-01-02 DIAGNOSIS — R5381 Other malaise: Secondary | ICD-10-CM

## 2013-01-02 DIAGNOSIS — R42 Dizziness and giddiness: Secondary | ICD-10-CM

## 2013-01-02 DIAGNOSIS — E119 Type 2 diabetes mellitus without complications: Secondary | ICD-10-CM

## 2013-01-02 DIAGNOSIS — E1149 Type 2 diabetes mellitus with other diabetic neurological complication: Secondary | ICD-10-CM | POA: Diagnosis not present

## 2013-01-02 DIAGNOSIS — E039 Hypothyroidism, unspecified: Secondary | ICD-10-CM

## 2013-01-02 DIAGNOSIS — R5383 Other fatigue: Secondary | ICD-10-CM

## 2013-01-02 DIAGNOSIS — M25561 Pain in right knee: Secondary | ICD-10-CM

## 2013-01-02 MED ORDER — MECLIZINE HCL 12.5 MG PO TABS: ORAL_TABLET | ORAL | Status: AC

## 2013-01-02 NOTE — Patient Instructions (Addendum)
Annual wellness early October, please call if you need me before.  Sleeping pills will be refilled  We will call re recent labs  They were excellent  No more glipizide  Injection today for nausea   Antivert for dizziness only  You can get xray of right knee and are referred to Dr Aline Brochure also  Fasting lipid, cmp and EGFr, hBA1C TSH early October  Shingles shot still needed

## 2013-01-02 NOTE — Progress Notes (Signed)
  Subjective:    Patient ID: [name] [name], female    DOB: [birth year], 79 y.o.   MRN: [id number]  HPI The PT is here for follow up and re-evaluation of chronic medical conditions, medication management and review of any available recent lab and radiology data.  Preventive health is updated, specifically  Cancer screening and Immunization.   Questions or concerns regarding consultations or procedures which the PT has had in the interim are  addressed. The PT denies any adverse reactions to current medications since the last visit.  C/o ongoing right knee pain, concerned about the number of injections she has been getting in the joint and requests 2nd ortho opinion, denies falls, but does experience light headedness at times, also vertigo. C/o chronic nausea, and requests injection for this     Review of Systems See HPI Denies recent fever or chills. Denies sinus pressure, nasal congestion, ear pain or sore throat. Denies chest congestion, productive cough or wheezing. Denies chest pains, palpitations and leg swelling Denies abdominal pain,  vomiting,diarrhea or constipation.   Denies dysuria, frequency, hesitancy or incontinence.  Denies headaches, seizures, numbness, or tingling. Denies depression, anxiety or insomnia. Denies skin break down or rash.        Objective:   Physical Exam  Patient alert and oriented and in no cardiopulmonary distress.  HEENT: No facial asymmetry, EOMI, no sinus tenderness,  oropharynx pink and moist.  Neck supple no adenopathy.  Chest: Clear to auscultation bilaterally.  CVS: S1, S2 systolic  murmur, no S3.Click  ABD: Soft non tender. Bowel sounds normal.  Ext: No edema  MS: decreased  ROM spine, shoulders, hips and knees.  Skin: Intact, no ulcerations or rash noted.  Psych: Good eye contact, normal affect. Memory intact not anxious or depressed appearing.  CNS: CN 2-12 intact, power, tone and sensation normal throughout.        Assessment & Plan:

## 2013-01-03 ENCOUNTER — Ambulatory Visit (HOSPITAL_COMMUNITY)
Admission: RE | Admit: 2013-01-03 | Discharge: 2013-01-03 | Disposition: A | Discharge status: Home / Self Care | Payer: Medicare Other | Source: Ambulatory Visit | Attending: Family Medicine | Admitting: Family Medicine

## 2013-01-03 DIAGNOSIS — R110 Nausea: Secondary | ICD-10-CM

## 2013-01-03 DIAGNOSIS — M25569 Pain in unspecified knee: Secondary | ICD-10-CM | POA: Insufficient documentation

## 2013-01-03 DIAGNOSIS — M1710 Unilateral primary osteoarthritis, unspecified knee: Secondary | ICD-10-CM | POA: Diagnosis not present

## 2013-01-03 DIAGNOSIS — IMO0002 Reserved for concepts with insufficient information to code with codable children: Secondary | ICD-10-CM | POA: Diagnosis not present

## 2013-01-03 DIAGNOSIS — M25561 Pain in right knee: Secondary | ICD-10-CM

## 2013-01-03 MED ORDER — ONDANSETRON HCL 4 MG/2ML IJ SOLN
4.0000 mg | Freq: Once | INTRAMUSCULAR | Status: AC
Start: 2013-01-03 — End: 2013-01-03
  Administered 2013-01-03: 4 mg via INTRAMUSCULAR

## 2013-01-04 NOTE — Assessment & Plan Note (Signed)
Excellent, no need for medication, diet controlled

## 2013-01-04 NOTE — Assessment & Plan Note (Signed)
Uncontrolled, daily nausea, zofran in office today

## 2013-01-04 NOTE — Assessment & Plan Note (Signed)
Intermittent l;ight headedness, esp with change in position, also vertigo, antivert prescribed for vertigo only Advised slow change in position , esp since on multiple cardiac drugs

## 2013-01-04 NOTE — Assessment & Plan Note (Signed)
Controlled, no change in medication DASH diet and commitment to daily physical activity for a minimum of 30 minutes discussed and encouraged, as a part of hypertension management. The importance of attaining a healthy weight is also discussed.  

## 2013-01-04 NOTE — Assessment & Plan Note (Signed)
Symptomatic with nausea, zofran in office

## 2013-01-04 NOTE — Assessment & Plan Note (Signed)
Controlled, no change in medication  

## 2013-01-04 NOTE — Assessment & Plan Note (Signed)
Ongoing and persistent, has had miltiple intra articular injections , requests 2nd orthopedic opinion, will get xray and refer

## 2013-01-04 NOTE — Assessment & Plan Note (Signed)
Hyperlipidemia:Low fat diet discussed and encouraged.  Controlled, no change in medication   

## 2013-01-05 IMAGING — US US BREAST*L*
1 series · 5 of 5 positions shown · non-contrast
Comparison: With priors

CLINICAL DATA: History of a probable papilloma in the subareolar
region of the left breast.  Surgery was recommended but due to
cardiac issues, the patient is felt not to be a surgical candidate.
Followup study.  The patient has no current complaints.

DIGITAL DIAGNOSTIC BILATERAL MAMMOGRAM WITH CAD

[Series 1: us breast*left* · 0.08mm/px · 5 of 5 slices shown]
[im 1/5]
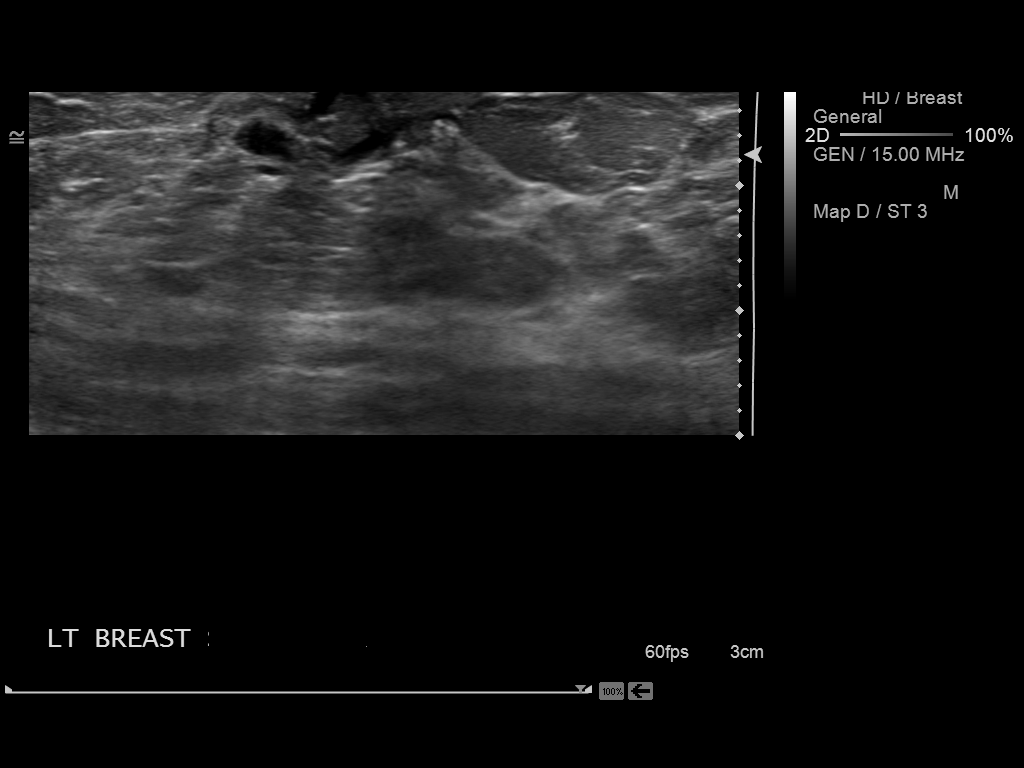
[im 2/5]
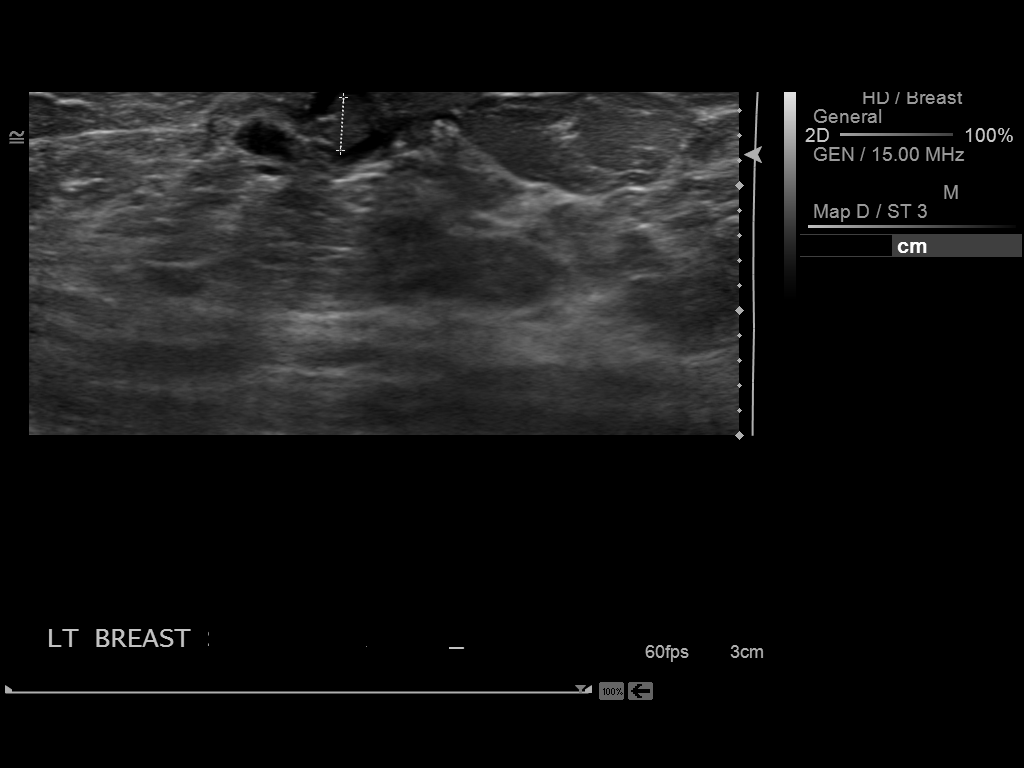
[im 3/5]
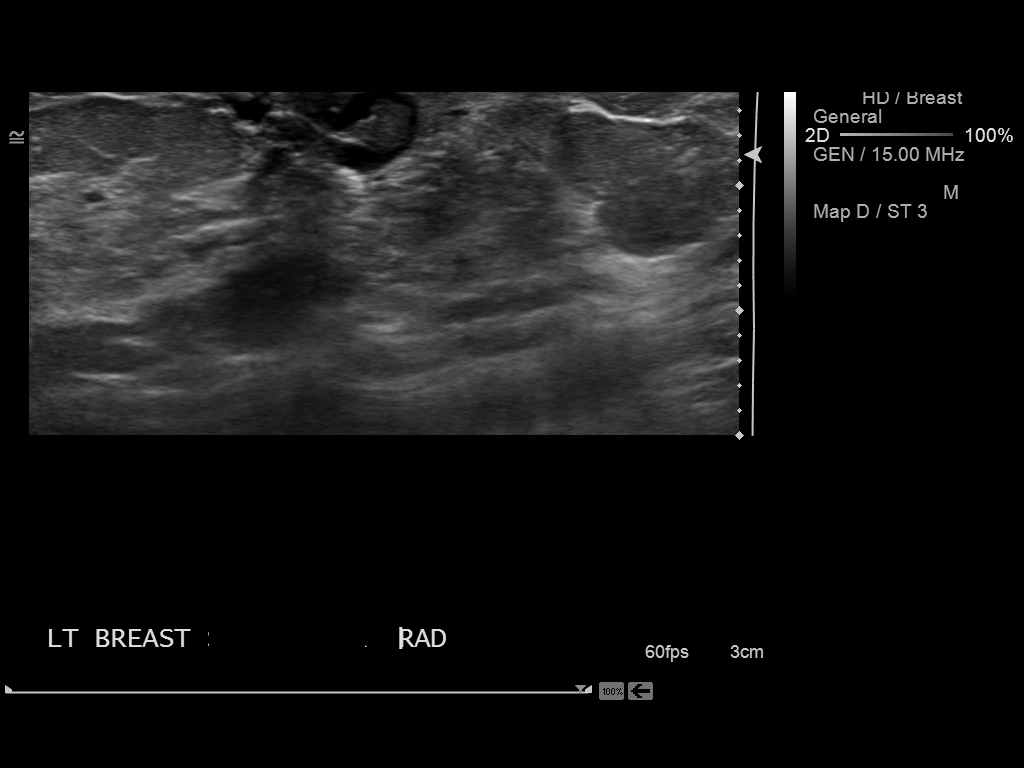
[im 4/5]
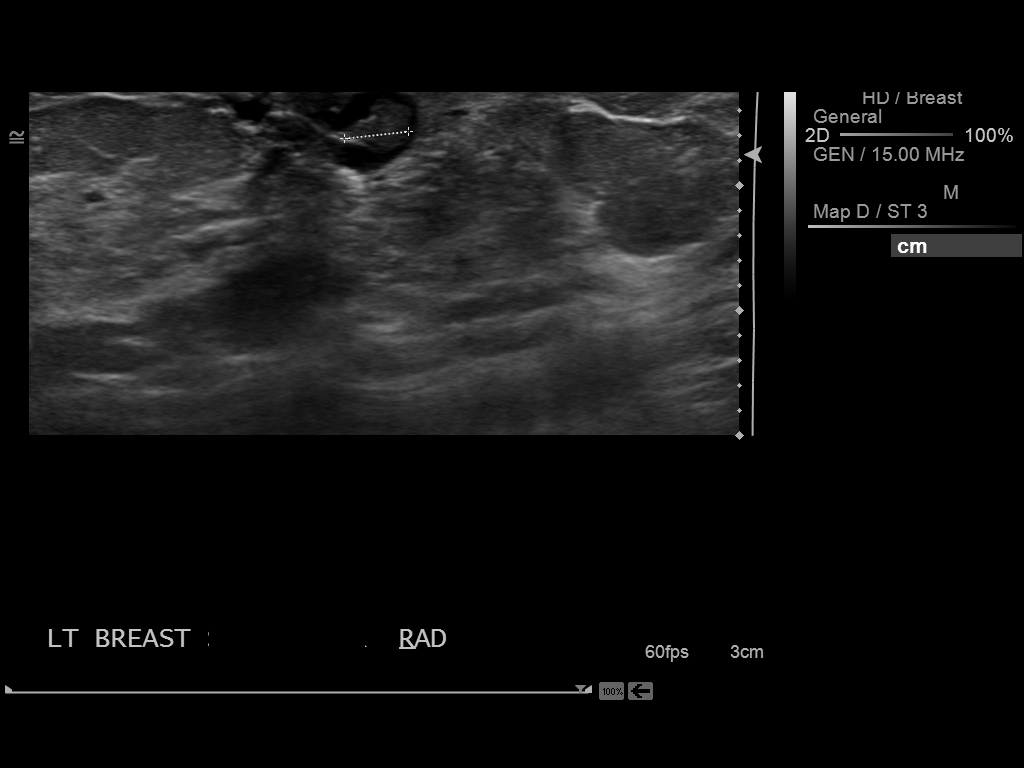
[im 5/5]
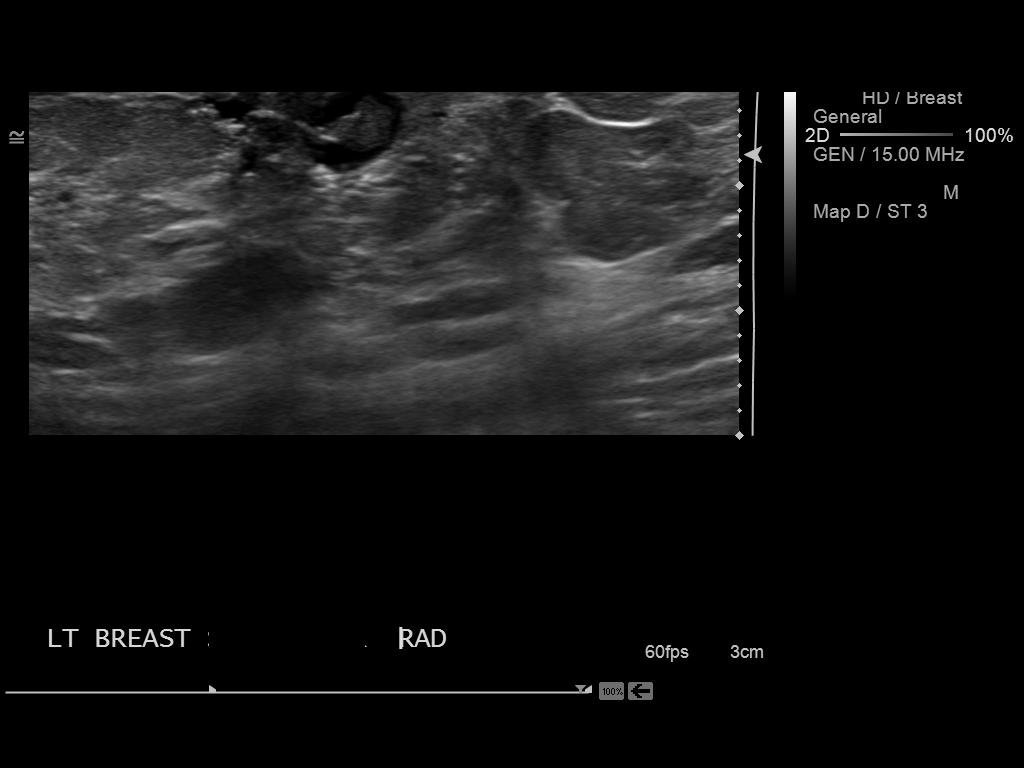

[5 of 5 positions shown; findings below may reference images not displayed]

FINDINGS: There are scattered fibroglandular densities.  There is
no new suspicious mass or malignant-type microcalcifications in
either breast.  A pacemaker projects over the left breast
superiorly.
Mammographic images were processed with CAD.

On physical exam, I do not palpate a mass in the left breast.

Ultrasound is performed, showing an intraductal soft tissue nodule
in the subareolar region of the left breast measuring maximal
diameter of 5 mm.  A stalk feeding this nodule is again identified.
It is stable from the prior ultrasounds.
IMPRESSION: Stable size and appearance of a probable intraductal papilloma.  No
new areas of concern are identified in either breast to suggest
malignancy.

The patient has previously seen Dr. Soo for surgical
consultation.  The patient is not a surgical candidate.  Bilateral
diagnostic mammogram and left breast ultrasound in 1 year is
recommended for follow up, unless new areas of concern warrant
earlier evaluation.

BI-RADS CATEGORY 4:  Suspicious abnormality - biopsy should be
considered.

## 2013-01-07 DIAGNOSIS — I359 Nonrheumatic aortic valve disorder, unspecified: Secondary | ICD-10-CM | POA: Diagnosis not present

## 2013-01-10 ENCOUNTER — Encounter: Payer: Self-pay | Admitting: Pharmacist Clinician (PhC)/ Clinical Pharmacy Specialist

## 2013-01-10 DIAGNOSIS — Z952 Presence of prosthetic heart valve: Secondary | ICD-10-CM

## 2013-01-10 DIAGNOSIS — Z7901 Long term (current) use of anticoagulants: Secondary | ICD-10-CM

## 2013-01-16 ENCOUNTER — Other Ambulatory Visit: Payer: Self-pay | Admitting: Family Medicine

## 2013-01-20 ENCOUNTER — Other Ambulatory Visit: Payer: Self-pay | Admitting: Family Medicine

## 2013-01-22 ENCOUNTER — Other Ambulatory Visit: Payer: Self-pay | Admitting: Family Medicine

## 2013-01-22 DIAGNOSIS — R928 Other abnormal and inconclusive findings on diagnostic imaging of breast: Secondary | ICD-10-CM

## 2013-01-27 IMAGING — CR DG CHEST 1V PORT
1 series · 1 of 1 positions shown · non-contrast
Comparison: 03/21/2012 at 7775 hours

CLINICAL DATA: Difficulty breathing.  Fever.  Chest pain.

PORTABLE CHEST - 1 VIEW

[AP]
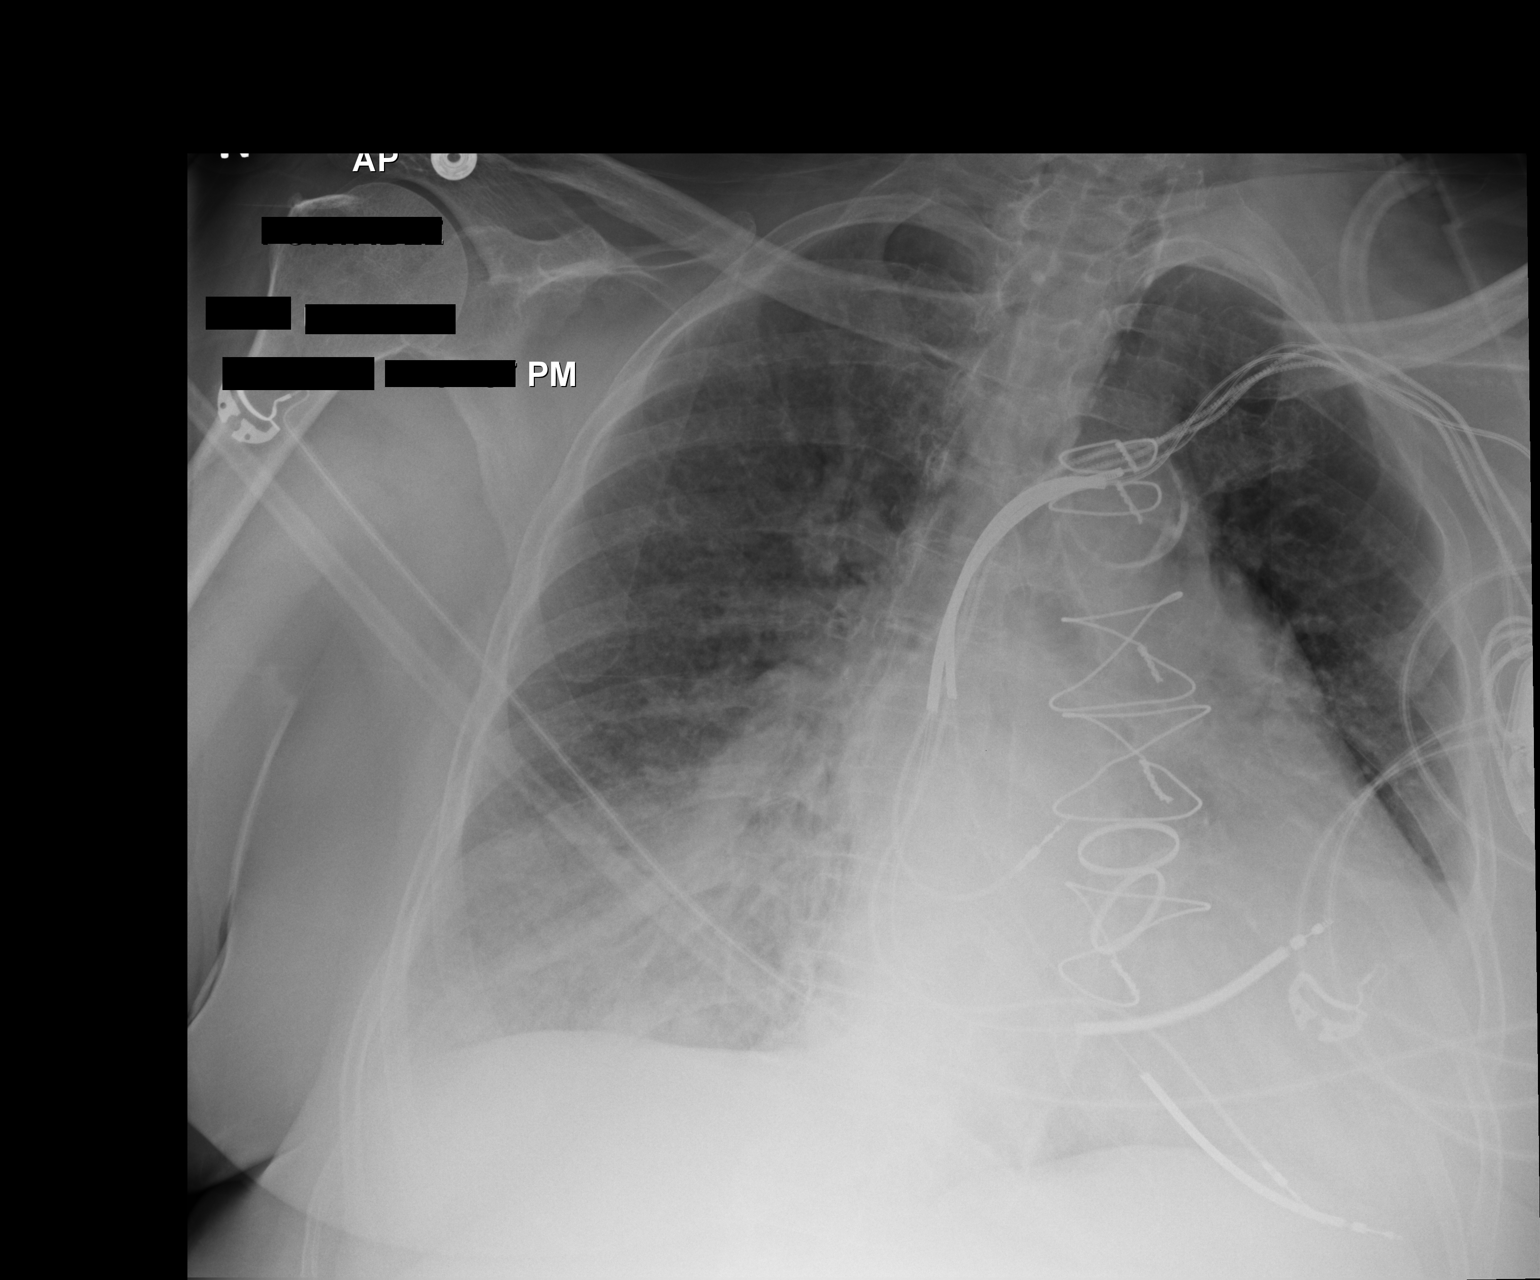

[1 of 1 positions shown; findings below may reference images not displayed]

FINDINGS: Stable appearance of postoperative changes in the
mediastinum as well as cardiac pacemaker leads.  Rotated patient
limits technique.  Heart size is enlarged.  Mild pulmonary vascular
congestion.  Infiltration in the right lung base could represent
edema, pneumonia, or atelectasis.  Mild blunting of the right
costophrenic angles suggesting small effusion.  No pneumothorax.
Calcification of the aorta.
IMPRESSION: Cardiac enlargement of pulmonary vascular congestion and
infiltration, atelectasis, or edema in the right lung base.
Probable small right pleural effusion.  No significant progression
since the previous study.

## 2013-01-28 DIAGNOSIS — H40019 Open angle with borderline findings, low risk, unspecified eye: Secondary | ICD-10-CM | POA: Diagnosis not present

## 2013-01-28 DIAGNOSIS — E1139 Type 2 diabetes mellitus with other diabetic ophthalmic complication: Secondary | ICD-10-CM | POA: Diagnosis not present

## 2013-01-28 DIAGNOSIS — H43819 Vitreous degeneration, unspecified eye: Secondary | ICD-10-CM | POA: Diagnosis not present

## 2013-01-28 DIAGNOSIS — Z961 Presence of intraocular lens: Secondary | ICD-10-CM | POA: Diagnosis not present

## 2013-02-03 ENCOUNTER — Other Ambulatory Visit (HOSPITAL_COMMUNITY): Payer: Self-pay | Admitting: Oncology

## 2013-02-04 IMAGING — CR DG CHEST 2V
2 series · 2 of 2 positions shown · non-contrast
Comparison: 03/23/2012

CLINICAL DATA: History of pneumonia and congestive heart failure.
COPD with weakness and some shortness of breath

CHEST - 2 VIEW

[w chest pa]
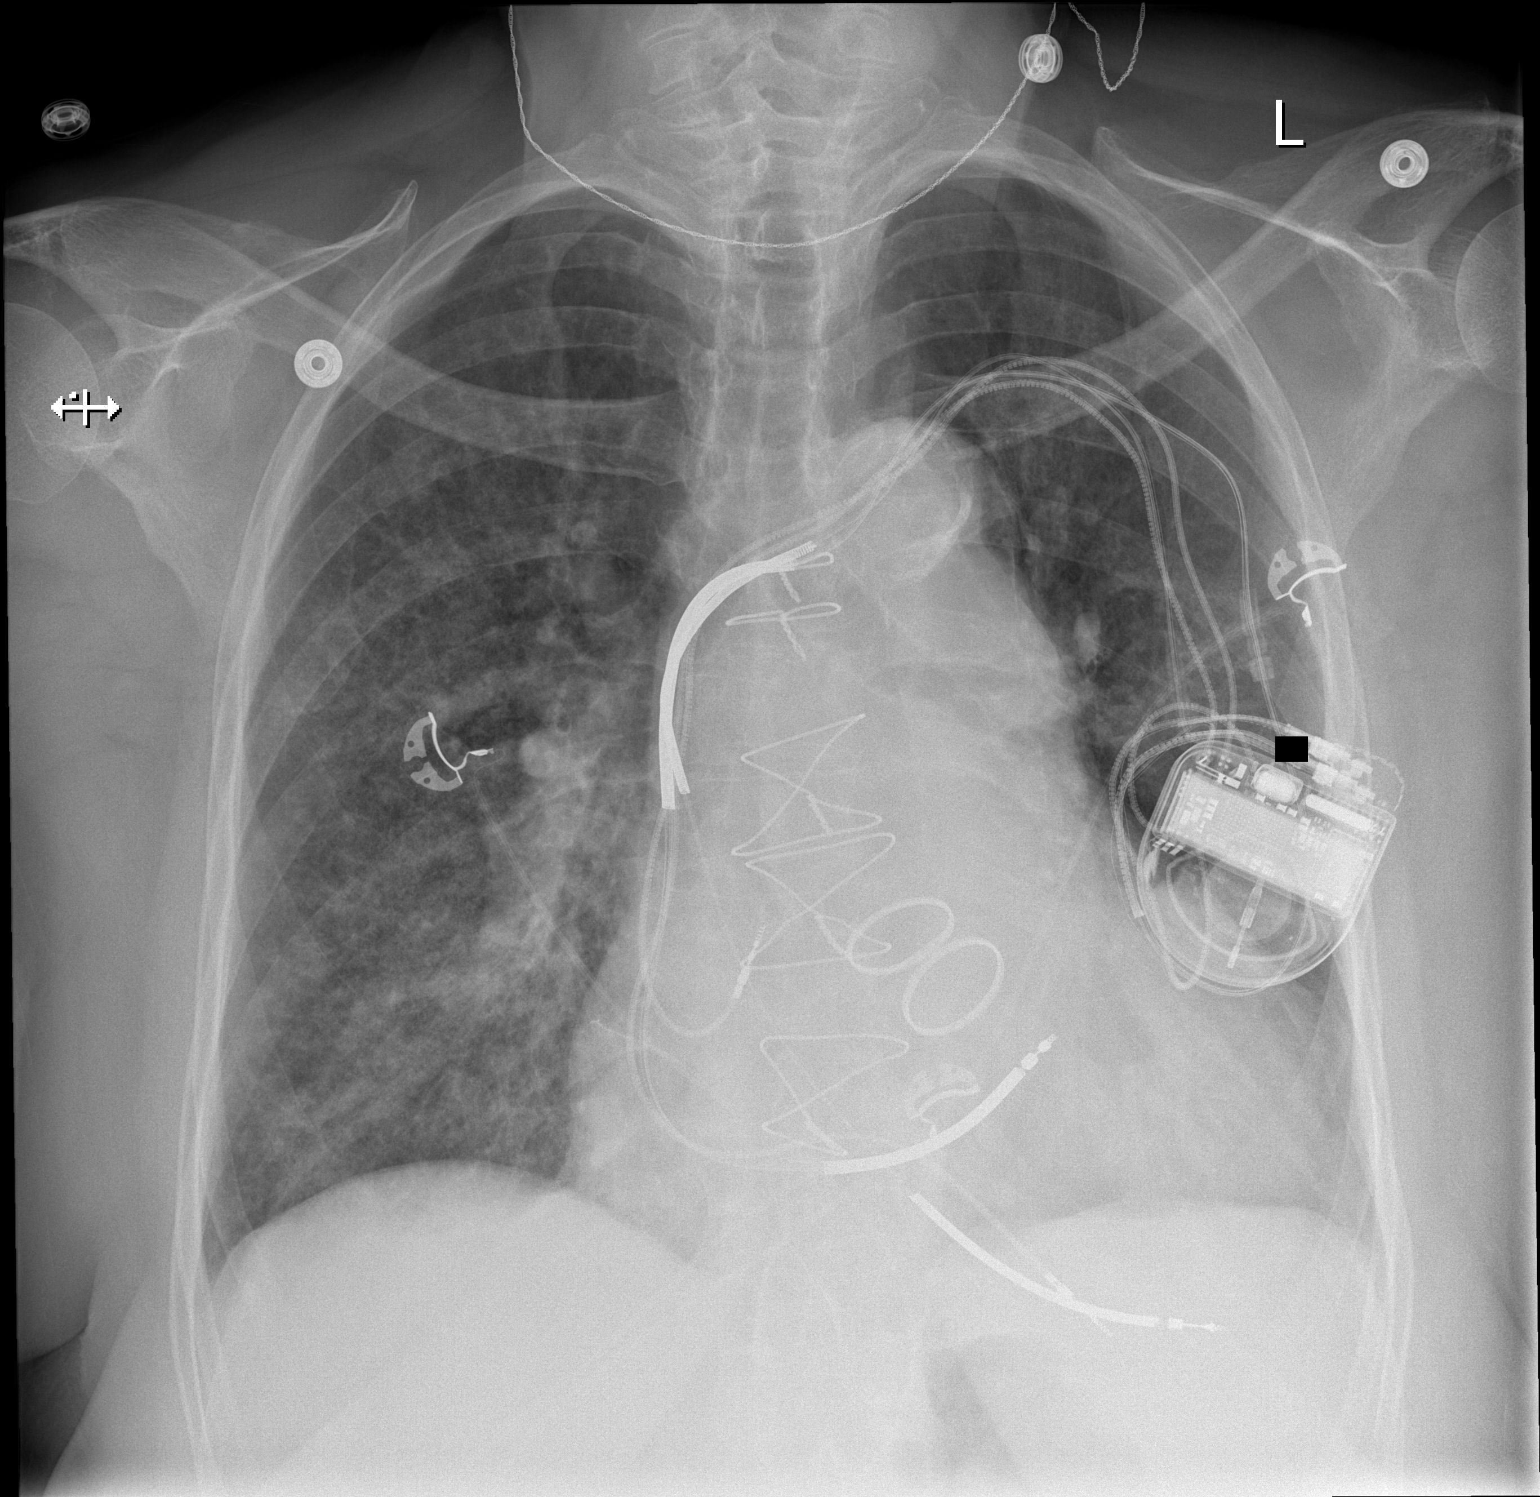

[w chest lat]
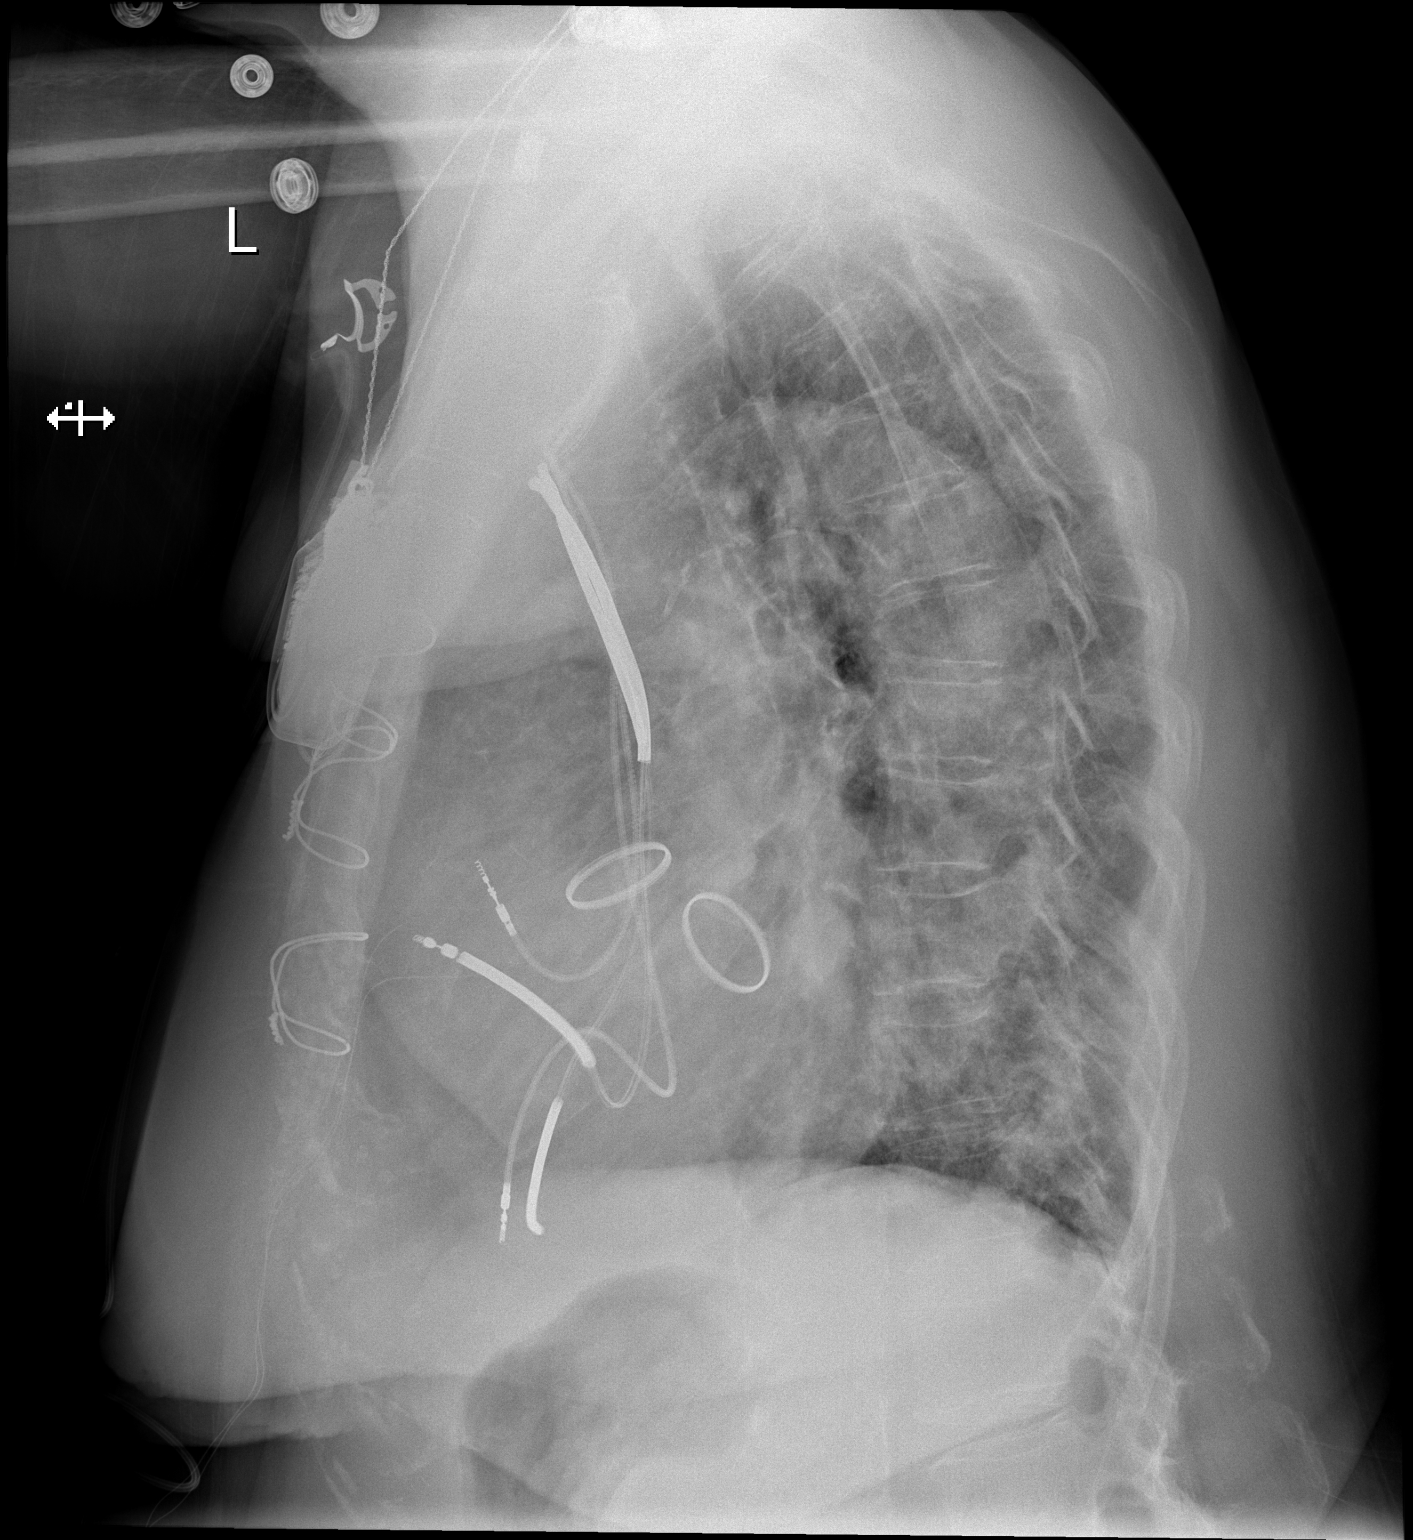

[2 of 2 positions shown; findings below may reference images not displayed]

FINDINGS: A four lead pacer is in place via a left subclavian
approach with lead tips stable in appearance and position.

Cardiomegaly and aortic ectasia are unchanged.  Pulmonary vascular
congestion without signs of overt congestive failure noted.
Interval improvement in aeration of both lung bases has occurred
since the previous exam.  No new focal infiltrates are seen and
basilar infiltrates have largely cleared since the previous exam.
No pleural fluid is noted.

Bony structures demonstrate anterior vertebral body height loss at
the approximate T6 vertebral level and are otherwise intact.
IMPRESSION: Improving interstitial edema pattern with clearing density at both
lung bases.  No new infiltrates are identified.

## 2013-02-05 ENCOUNTER — Ambulatory Visit: Payer: Medicare Other

## 2013-02-05 ENCOUNTER — Ambulatory Visit (INDEPENDENT_AMBULATORY_CARE_PROVIDER_SITE_OTHER): Payer: Medicare Other | Admitting: Pharmacist Clinician (PhC)/ Clinical Pharmacy Specialist

## 2013-02-05 DIAGNOSIS — Z954 Presence of other heart-valve replacement: Secondary | ICD-10-CM | POA: Diagnosis not present

## 2013-02-05 DIAGNOSIS — Z7901 Long term (current) use of anticoagulants: Secondary | ICD-10-CM | POA: Diagnosis not present

## 2013-02-05 DIAGNOSIS — Z952 Presence of prosthetic heart valve: Secondary | ICD-10-CM

## 2013-02-05 LAB — POCT INR: INR: 2.9

## 2013-02-06 ENCOUNTER — Telehealth: Payer: Self-pay | Admitting: Pharmacist Clinician (PhC)/ Clinical Pharmacy Specialist

## 2013-02-06 NOTE — Telephone Encounter (Signed)
Message copied by Rockne Menghini on Thu Feb 06, 2013  1:51 PM ------      Message from: Lorretta Harp      Created: Wed Feb 05, 2013  7:43 PM       ok ------

## 2013-02-06 NOTE — Telephone Encounter (Signed)
Opened in error

## 2013-02-13 ENCOUNTER — Other Ambulatory Visit: Payer: Self-pay | Admitting: *Deleted

## 2013-02-19 ENCOUNTER — Ambulatory Visit (INDEPENDENT_AMBULATORY_CARE_PROVIDER_SITE_OTHER): Payer: Medicare Other | Admitting: Orthopedic Surgery

## 2013-02-19 ENCOUNTER — Encounter: Payer: Self-pay | Admitting: Orthopedic Surgery

## 2013-02-19 VITALS — BP 114/60 | Ht 67.0 in | Wt 228.0 lb

## 2013-02-19 DIAGNOSIS — M179 Osteoarthritis of knee, unspecified: Secondary | ICD-10-CM | POA: Insufficient documentation

## 2013-02-19 DIAGNOSIS — M75102 Unspecified rotator cuff tear or rupture of left shoulder, not specified as traumatic: Secondary | ICD-10-CM | POA: Insufficient documentation

## 2013-02-19 DIAGNOSIS — M48061 Spinal stenosis, lumbar region without neurogenic claudication: Secondary | ICD-10-CM

## 2013-02-19 DIAGNOSIS — IMO0002 Reserved for concepts with insufficient information to code with codable children: Secondary | ICD-10-CM

## 2013-02-19 DIAGNOSIS — M1710 Unilateral primary osteoarthritis, unspecified knee: Secondary | ICD-10-CM

## 2013-02-19 DIAGNOSIS — M719 Bursopathy, unspecified: Secondary | ICD-10-CM | POA: Diagnosis not present

## 2013-02-19 DIAGNOSIS — M67919 Unspecified disorder of synovium and tendon, unspecified shoulder: Secondary | ICD-10-CM | POA: Diagnosis not present

## 2013-02-19 HISTORY — DX: Spinal stenosis, lumbar region without neurogenic claudication: M48.061

## 2013-02-19 NOTE — Progress Notes (Signed)
Patient ID: [name] [name], female   DOB: May 12, 1932, 79 y.o.   MRN: [id number] Chief Complaint  Patient presents with  . Knee Pain    second opinion to evaluate right knee. Referred by Dr. [name]    79 year old female here for second opinion regarding her right knee. She also complains left shoulder pain she got an injection from me for over 5 years ago. She's been followed in town by local orthopedist with history of osteoarthritis mild as documented on x-ray in the last 6 months she's been taking hydrocodone and getting injections. She's concerned as she's not getting the right injections because they don't last long. She complains of sharp throbbing burning 8/10 intermittent pain it is most prominent when she's walking on her leg however, she also has numbness and tingling in her right thigh and some swelling as well in the right knee  She complains of painful for elevation and painful range of motion of the left shoulder which has recurred after injection over 5 years ago  She would like an injection in her left shoulder she was evaluated for further treatment in her right knee  She reports weight gain fatigue shortness of breath and wheezing nausea constipation joint pain swelling stiffness muscle pain numbness tingling easy bruising as far as her review of systems goes the other systems were reviewed and were negative.  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 Her peripheral pulses are intact  extremities are warm there is no edema there is no tenderness nose no varicose veins or swelling  Lymphatic system is normal upper and lower extremities  Her ambulation is altered her gait pattern is slow her stride length is short  Her right upper extremity is normally aligned no crepitation no joint contracture no instability normal muscle strength muscle tone  Left shoulder painful range of motion no tenderness no instability normal strength and muscle tone positive impingement sign  Skin 4 extremities normal  She is oriented to time person and place mood and affect are normal  Lower extremities left knee mild crepitation flexion ARC 125 knee stable strength and muscle tone normal  Right knee crepitance on range of motion clicking and popping on range of motion joint lines nontender patellar tendon nontender peripatellar region nontender knee stable muscle strength and tone normal  Skin lower extremities normal  Reflexes normal positive straight leg raise on the right negative on the left distal sensation normal all 4 extremities  X-rays of her knee show mild arthritis  CT scan lumbar spine shows severe spinal stenosis at L2-3 and multilevel spinal stenosis throughout her lumbar spine  Therefore our diagnosis is mild arthritis of the knee and spinal stenosis this accounts for her left thigh pain and also for some of her knee pain which is not fully relieved by steroid injection  Option for an injection was offered and she wanted injection in knee and shoulder and I've advised her to start physical therapy on her back for lumbar stabilization  Knee  Injection Procedure Note  Pre-operative Diagnosis: right knee oa  Post-operative Diagnosis: same  Indications: pain  Anesthesia: ethyl chloride   Procedure Details   Verbal consent was obtained for the procedure. Time out was completed.The joint was prepped with alcohol, followed by  Ethyl chloride spray and A 20 gauge needle  was inserted into the knee via lateral approach; 44ml 1% lidocaine and 1 ml of depomedrol  was then injected into the joint . The needle was removed and the area cleansed and dressed.  Complications:  None; patient tolerated the procedure well. Subacromial Shoulder Injection Procedure Note  Pre-operative Diagnosis: left RC Syndrome  Post-operative Diagnosis: same  Indications: pain   Anesthesia: ethyl chloride   Procedure Details   Verbal consent was obtained for the procedure. The shoulder was prepped withalcohol and the skin was anesthetized. A 20 gauge needle was advanced into the subacromial space through posterior approach without difficulty  The space was then injected with 3 ml 1% lidocaine and 1 ml of depomedrol. The injection site was cleansed with isopropyl alcohol and a dressing was applied.  Complications:  None; patient tolerated the procedure well.

## 2013-02-19 NOTE — Patient Instructions (Addendum)
Arthritis knee   Spinal stenosis caused by arthritis and disc disease   Recommend Lumbar stabilization with therapy

## 2013-02-21 ENCOUNTER — Telehealth: Payer: Self-pay | Admitting: Family Medicine

## 2013-02-21 ENCOUNTER — Other Ambulatory Visit: Payer: Self-pay | Admitting: Family Medicine

## 2013-02-21 DIAGNOSIS — Z952 Presence of prosthetic heart valve: Secondary | ICD-10-CM

## 2013-02-21 DIAGNOSIS — Z5181 Encounter for therapeutic drug level monitoring: Secondary | ICD-10-CM

## 2013-02-21 DIAGNOSIS — Z95810 Presence of automatic (implantable) cardiac defibrillator: Secondary | ICD-10-CM

## 2013-02-21 NOTE — Telephone Encounter (Signed)
pls do not refer until pt let's [place] know that she wants to transfer her care

## 2013-02-21 NOTE — Telephone Encounter (Signed)
This pt's coumadin is very difficult to manage. I suggest she see a new cardiologist and will be referring her to [facility], pls let her know, also pls directly contact the coumadin clinic for them to take over her management asap  I tried to spk with her message machine came on, no message was left

## 2013-02-21 NOTE — Telephone Encounter (Signed)
pls let pt understand, if she wants to stay with her cardiologists, not a problem , but I am UNABLE to manage her coumadin, so she will need to see them in [facility] for this, otherwise she needs to transfer her care to [facility] cardiology, unless the [facility] is just willing to follow her coumadin only, pls let me know how she feels I have entered the referral butwill ask referral staff to hold on this until pt lets [place] know what she wants

## 2013-02-24 ENCOUNTER — Telehealth (HOSPITAL_COMMUNITY): Payer: Self-pay | Admitting: Oncology

## 2013-02-24 ENCOUNTER — Other Ambulatory Visit (HOSPITAL_COMMUNITY): Payer: Self-pay | Admitting: Oncology

## 2013-02-24 DIAGNOSIS — E611 Iron deficiency: Secondary | ICD-10-CM

## 2013-02-24 NOTE — Telephone Encounter (Signed)
Noted, thanks!

## 2013-02-24 NOTE — Telephone Encounter (Signed)
Pt said he b/p is low she is tired she would like to have some lab work done to see if she needs iron again

## 2013-02-24 NOTE — Telephone Encounter (Signed)
Referral was already entered so pls schedule appt , she will need the coumadin clinic asap, her coumadin is extremely difficult to manage, thanks

## 2013-02-24 NOTE — Telephone Encounter (Signed)
Patient aware of appt with [facility] on 03/27/2013 @ 1:30pm   She states that she will keep followup appt with [facility] for the month of June so that she will stay up to date on Coumadin.  She will also transfer her records to [facility] at that appt.

## 2013-02-24 NOTE — Telephone Encounter (Signed)
Labs placed.  Schedule lab appt.

## 2013-02-24 NOTE — Telephone Encounter (Signed)
Patient understands that if she changes to [place] that she would have to see cardiology there as well.  She is accepting of this and would like referral put in for cardiology and coumadin clinic.

## 2013-02-24 NOTE — Telephone Encounter (Signed)
Patient notified to come in for blood work on 02/25/13 @ 11am.

## 2013-02-25 ENCOUNTER — Encounter (HOSPITAL_COMMUNITY): Payer: Medicare Other | Attending: Oncology

## 2013-02-25 DIAGNOSIS — D596 Hemoglobinuria due to hemolysis from other external causes: Secondary | ICD-10-CM | POA: Diagnosis not present

## 2013-02-25 DIAGNOSIS — D509 Iron deficiency anemia, unspecified: Secondary | ICD-10-CM

## 2013-02-25 DIAGNOSIS — E611 Iron deficiency: Secondary | ICD-10-CM

## 2013-02-25 LAB — CBC WITH DIFFERENTIAL/PLATELET
Basophils Absolute: 0 10*3/uL (ref 0.0–0.1)
Basophils Relative: 0 % (ref 0–1)
Eosinophils Absolute: 0 10*3/uL (ref 0.0–0.7)
Eosinophils Relative: 0 % (ref 0–5)
HCT: 33.3 % — ABNORMAL LOW (ref 36.0–46.0)
Hemoglobin: 11.2 g/dL — ABNORMAL LOW (ref 12.0–15.0)
Lymphocytes Relative: 13 % (ref 12–46)
Lymphs Abs: 1.2 10*3/uL (ref 0.7–4.0)
MCH: 31.3 pg (ref 26.0–34.0)
MCHC: 33.6 g/dL (ref 30.0–36.0)
MCV: 93 fL (ref 78.0–100.0)
Monocytes Absolute: 0.6 10*3/uL (ref 0.1–1.0)
Monocytes Relative: 7 % (ref 3–12)
Neutro Abs: 7.4 10*3/uL (ref 1.7–7.7)
Neutrophils Relative %: 80 % — ABNORMAL HIGH (ref 43–77)
Platelets: 327 10*3/uL (ref 150–400)
RBC: 3.58 MIL/uL — ABNORMAL LOW (ref 3.87–5.11)
RDW: 13.9 % (ref 11.5–15.5)
WBC: 9.3 10*3/uL (ref 4.0–10.5)

## 2013-02-25 LAB — FERRITIN: Ferritin: 1306 ng/mL — ABNORMAL HIGH (ref 10–291)

## 2013-02-25 LAB — IRON AND TIBC
Iron: 77 ug/dL (ref 42–135)
Saturation Ratios: 19 % — ABNORMAL LOW (ref 20–55)
TIBC: 409 ug/dL (ref 250–470)
UIBC: 332 ug/dL (ref 125–400)

## 2013-02-25 NOTE — Progress Notes (Signed)
Labs drawn today for cbc/diff,ferr,Iron and IBC 

## 2013-02-27 ENCOUNTER — Ambulatory Visit (INDEPENDENT_AMBULATORY_CARE_PROVIDER_SITE_OTHER): Payer: Medicare Other

## 2013-02-27 VITALS — BP 128/78 | Wt 233.0 lb

## 2013-02-27 DIAGNOSIS — R110 Nausea: Secondary | ICD-10-CM | POA: Diagnosis not present

## 2013-02-28 ENCOUNTER — Other Ambulatory Visit (HOSPITAL_COMMUNITY): Payer: Self-pay | Admitting: Cardiovascular Disease

## 2013-02-28 ENCOUNTER — Other Ambulatory Visit: Payer: Self-pay | Admitting: Cardiovascular Disease

## 2013-02-28 DIAGNOSIS — Z4502 Encounter for adjustment and management of automatic implantable cardiac defibrillator: Secondary | ICD-10-CM | POA: Diagnosis not present

## 2013-02-28 DIAGNOSIS — R6889 Other general symptoms and signs: Secondary | ICD-10-CM | POA: Diagnosis not present

## 2013-02-28 DIAGNOSIS — R110 Nausea: Secondary | ICD-10-CM | POA: Diagnosis not present

## 2013-02-28 DIAGNOSIS — R5381 Other malaise: Secondary | ICD-10-CM | POA: Diagnosis not present

## 2013-02-28 DIAGNOSIS — M109 Gout, unspecified: Secondary | ICD-10-CM | POA: Diagnosis not present

## 2013-02-28 DIAGNOSIS — D539 Nutritional anemia, unspecified: Secondary | ICD-10-CM | POA: Diagnosis not present

## 2013-02-28 DIAGNOSIS — Z7901 Long term (current) use of anticoagulants: Secondary | ICD-10-CM | POA: Diagnosis not present

## 2013-02-28 DIAGNOSIS — Z952 Presence of prosthetic heart valve: Secondary | ICD-10-CM

## 2013-02-28 DIAGNOSIS — I359 Nonrheumatic aortic valve disorder, unspecified: Secondary | ICD-10-CM | POA: Diagnosis not present

## 2013-02-28 DIAGNOSIS — R5383 Other fatigue: Secondary | ICD-10-CM | POA: Diagnosis not present

## 2013-02-28 DIAGNOSIS — I32 Pericarditis in diseases classified elsewhere: Secondary | ICD-10-CM | POA: Diagnosis not present

## 2013-02-28 DIAGNOSIS — Z9889 Other specified postprocedural states: Secondary | ICD-10-CM

## 2013-02-28 LAB — COMPREHENSIVE METABOLIC PANEL
ALT: 11 U/L (ref 0–35)
AST: 24 U/L (ref 0–37)
Albumin: 4 g/dL (ref 3.5–5.2)
Alkaline Phosphatase: 39 U/L (ref 39–117)
BUN: 75 mg/dL — ABNORMAL HIGH (ref 6–23)
CO2: 30 mEq/L (ref 19–32)
Calcium: 9.1 mg/dL (ref 8.4–10.5)
Chloride: 96 mEq/L (ref 96–112)
Creat: 3.34 mg/dL — ABNORMAL HIGH (ref 0.50–1.10)
Glucose, Bld: 117 mg/dL — ABNORMAL HIGH (ref 70–99)
Potassium: 4.7 mEq/L (ref 3.5–5.3)
Sodium: 136 mEq/L (ref 135–145)
Total Bilirubin: 0.4 mg/dL (ref 0.3–1.2)
Total Protein: 7.2 g/dL (ref 6.0–8.3)

## 2013-02-28 LAB — IRON AND TIBC
%SAT: 21 % (ref 20–55)
Iron: 84 ug/dL (ref 42–145)
TIBC: 391 ug/dL (ref 250–470)
UIBC: 307 ug/dL (ref 125–400)

## 2013-02-28 LAB — CBC WITH DIFFERENTIAL/PLATELET
Basophils Absolute: 0 10*3/uL (ref 0.0–0.1)
Basophils Relative: 0 % (ref 0–1)
Eosinophils Absolute: 0 10*3/uL (ref 0.0–0.7)
Eosinophils Relative: 0 % (ref 0–5)
HCT: 31.1 % — ABNORMAL LOW (ref 36.0–46.0)
Hemoglobin: 10.5 g/dL — ABNORMAL LOW (ref 12.0–15.0)
Lymphocytes Relative: 16 % (ref 12–46)
Lymphs Abs: 1.5 10*3/uL (ref 0.7–4.0)
MCH: 30.3 pg (ref 26.0–34.0)
MCHC: 33.8 g/dL (ref 30.0–36.0)
MCV: 89.9 fL (ref 78.0–100.0)
Monocytes Absolute: 0.7 10*3/uL (ref 0.1–1.0)
Monocytes Relative: 8 % (ref 3–12)
Neutro Abs: 7.1 10*3/uL (ref 1.7–7.7)
Neutrophils Relative %: 76 % (ref 43–77)
Platelets: 348 10*3/uL (ref 150–400)
RBC: 3.46 MIL/uL — ABNORMAL LOW (ref 3.87–5.11)
RDW: 14.6 % (ref 11.5–15.5)
WBC: 9.4 10*3/uL (ref 4.0–10.5)

## 2013-02-28 LAB — PROTIME-INR
INR: 3.52 — ABNORMAL HIGH (ref <1.50)
Prothrombin Time: 33.5 seconds — ABNORMAL HIGH (ref 11.6–15.2)

## 2013-02-28 LAB — PACEMAKER DEVICE OBSERVATION

## 2013-02-28 LAB — SEDIMENTATION RATE: Sed Rate: 91 mm/hr — ABNORMAL HIGH (ref 0–22)

## 2013-02-28 LAB — URIC ACID: Uric Acid, Serum: 8.4 mg/dL — ABNORMAL HIGH (ref 2.4–7.0)

## 2013-02-28 MED ORDER — ONDANSETRON HCL 4 MG/2ML IJ SOLN
4.0000 mg | Freq: Once | INTRAMUSCULAR | Status: AC
  Administered 2013-02-28: 4 mg via INTRAMUSCULAR

## 2013-02-28 NOTE — Progress Notes (Signed)
Patient presented to the office c/o nausea.  Dr. Ree Kida and advised injection of Zofran 4mg  to be given.  Zofran 4 mg given in right upper quadrant of gluteal.  No sign or symptom of adverse reaction.  No voiced complaints.  Patient aware to call office if not effective.

## 2013-03-01 LAB — TSH: TSH: 1.044 u[IU]/mL (ref 0.350–4.500)

## 2013-03-01 LAB — FERRITIN: Ferritin: 1671 ng/mL — ABNORMAL HIGH (ref 10–291)

## 2013-03-01 LAB — FOLATE: Folate: >20 ng/mL

## 2013-03-03 ENCOUNTER — Ambulatory Visit (HOSPITAL_COMMUNITY): Payer: Medicare Other | Admitting: Physical Therapy

## 2013-03-03 ENCOUNTER — Ambulatory Visit (INDEPENDENT_AMBULATORY_CARE_PROVIDER_SITE_OTHER): Payer: Self-pay | Admitting: Pharmacist Clinician (PhC)/ Clinical Pharmacy Specialist

## 2013-03-03 ENCOUNTER — Ambulatory Visit: Payer: Medicare Other

## 2013-03-03 DIAGNOSIS — Z7901 Long term (current) use of anticoagulants: Secondary | ICD-10-CM

## 2013-03-03 DIAGNOSIS — Z954 Presence of other heart-valve replacement: Secondary | ICD-10-CM

## 2013-03-03 DIAGNOSIS — Z952 Presence of prosthetic heart valve: Secondary | ICD-10-CM

## 2013-03-04 ENCOUNTER — Telehealth: Payer: Self-pay | Admitting: Cardiovascular Disease

## 2013-03-04 NOTE — Telephone Encounter (Signed)
Returning call from this am?

## 2013-03-04 NOTE — Telephone Encounter (Signed)
Returned call.  No answer/voicemail.  No calls made from triage today or recently.  Spoke w/ Mliss Sax and no calls today.  Per Erasmo Downer, she spoke w/ pt yesterday and gave INR results.

## 2013-03-05 ENCOUNTER — Encounter (HOSPITAL_COMMUNITY): Payer: Medicare Other

## 2013-03-05 ENCOUNTER — Ambulatory Visit: Payer: Medicare Other

## 2013-03-06 ENCOUNTER — Encounter: Payer: Self-pay | Admitting: Cardiovascular Disease

## 2013-03-07 ENCOUNTER — Telehealth: Payer: Self-pay | Admitting: Cardiovascular Disease

## 2013-03-07 NOTE — Telephone Encounter (Signed)
Wants lab results fom 02-28-13!

## 2013-03-10 ENCOUNTER — Telehealth: Payer: Self-pay

## 2013-03-10 ENCOUNTER — Ambulatory Visit (INDEPENDENT_AMBULATORY_CARE_PROVIDER_SITE_OTHER): Payer: Medicare Other | Admitting: Family Medicine

## 2013-03-10 ENCOUNTER — Encounter: Payer: Self-pay | Admitting: Family Medicine

## 2013-03-10 VITALS — BP 98/58 | HR 68 | Temp 97.9°F | Resp 18 | Ht 66.0 in | Wt 224.0 lb

## 2013-03-10 DIAGNOSIS — I2510 Atherosclerotic heart disease of native coronary artery without angina pectoris: Secondary | ICD-10-CM | POA: Diagnosis not present

## 2013-03-10 DIAGNOSIS — R0609 Other forms of dyspnea: Secondary | ICD-10-CM

## 2013-03-10 DIAGNOSIS — K8020 Calculus of gallbladder without cholecystitis without obstruction: Secondary | ICD-10-CM

## 2013-03-10 DIAGNOSIS — Z952 Presence of prosthetic heart valve: Secondary | ICD-10-CM

## 2013-03-10 DIAGNOSIS — I428 Other cardiomyopathies: Secondary | ICD-10-CM

## 2013-03-10 DIAGNOSIS — N3000 Acute cystitis without hematuria: Secondary | ICD-10-CM | POA: Diagnosis not present

## 2013-03-10 DIAGNOSIS — N183 Chronic kidney disease, stage 3 unspecified: Secondary | ICD-10-CM | POA: Diagnosis not present

## 2013-03-10 DIAGNOSIS — I5022 Chronic systolic (congestive) heart failure: Secondary | ICD-10-CM

## 2013-03-10 DIAGNOSIS — I509 Heart failure, unspecified: Secondary | ICD-10-CM

## 2013-03-10 DIAGNOSIS — R110 Nausea: Secondary | ICD-10-CM

## 2013-03-10 DIAGNOSIS — Z954 Presence of other heart-valve replacement: Secondary | ICD-10-CM | POA: Diagnosis not present

## 2013-03-10 DIAGNOSIS — R5383 Other fatigue: Secondary | ICD-10-CM | POA: Insufficient documentation

## 2013-03-10 DIAGNOSIS — R5381 Other malaise: Secondary | ICD-10-CM

## 2013-03-10 DIAGNOSIS — E611 Iron deficiency: Secondary | ICD-10-CM

## 2013-03-10 DIAGNOSIS — D509 Iron deficiency anemia, unspecified: Secondary | ICD-10-CM | POA: Diagnosis not present

## 2013-03-10 DIAGNOSIS — R0989 Other specified symptoms and signs involving the circulatory and respiratory systems: Secondary | ICD-10-CM | POA: Diagnosis not present

## 2013-03-10 DIAGNOSIS — N3090 Cystitis, unspecified without hematuria: Secondary | ICD-10-CM | POA: Insufficient documentation

## 2013-03-10 LAB — CBC WITH DIFFERENTIAL/PLATELET
Basophils Absolute: 0 10*3/uL (ref 0.0–0.1)
Basophils Relative: 0 % (ref 0–1)
Eosinophils Absolute: 0 10*3/uL (ref 0.0–0.7)
Eosinophils Relative: 0 % (ref 0–5)
HCT: 34.4 % — ABNORMAL LOW (ref 36.0–46.0)
Hemoglobin: 11.1 g/dL — ABNORMAL LOW (ref 12.0–15.0)
Lymphocytes Relative: 15 % (ref 12–46)
Lymphs Abs: 1 10*3/uL (ref 0.7–4.0)
MCH: 30.7 pg (ref 26.0–34.0)
MCHC: 32.3 g/dL (ref 30.0–36.0)
MCV: 95 fL (ref 78.0–100.0)
Monocytes Absolute: 0.4 10*3/uL (ref 0.1–1.0)
Monocytes Relative: 6 % (ref 3–12)
Neutro Abs: 5.5 10*3/uL (ref 1.7–7.7)
Neutrophils Relative %: 78 % — ABNORMAL HIGH (ref 43–77)
Platelets: 262 10*3/uL (ref 150–400)
RBC: 3.62 MIL/uL — ABNORMAL LOW (ref 3.87–5.11)
RDW: 14.3 % (ref 11.5–15.5)
WBC: 7 10*3/uL (ref 4.0–10.5)

## 2013-03-10 LAB — BASIC METABOLIC PANEL
BUN: 56 mg/dL — ABNORMAL HIGH (ref 6–23)
CO2: 25 mEq/L (ref 19–32)
Calcium: 9.2 mg/dL (ref 8.4–10.5)
Chloride: 96 mEq/L (ref 96–112)
Creat: 2.4 mg/dL — ABNORMAL HIGH (ref 0.50–1.10)
Glucose, Bld: 118 mg/dL — ABNORMAL HIGH (ref 70–99)
Potassium: 4.9 mEq/L (ref 3.5–5.3)
Sodium: 135 mEq/L (ref 135–145)

## 2013-03-10 LAB — BRAIN NATRIURETIC PEPTIDE: Brain Natriuretic Peptide: 1009 pg/mL — ABNORMAL HIGH (ref 0.0–100.0)

## 2013-03-10 MED ORDER — ONDANSETRON HCL 4 MG/2ML IJ SOLN
4.0000 mg | Freq: Once | INTRAMUSCULAR | Status: AC
  Administered 2013-03-10: 4 mg via INTRAMUSCULAR

## 2013-03-10 NOTE — Assessment & Plan Note (Signed)
Chronic nausea. Zofran in office. Exam and history not consistent with acute cholecystitis

## 2013-03-10 NOTE — Telephone Encounter (Signed)
States she needs to be seen. Feels awful- no energy/strength. Had stomach virus last week and she has to force herself to eat just to take her meds. Still having some upset stomach. Doesn't usually ask to be seen unless she feels its necessary. Ok to work in?

## 2013-03-10 NOTE — Assessment & Plan Note (Signed)
Marked deterioration noted in 06/6 lab before acute illness, rept lab today, may need hospitalization

## 2013-03-10 NOTE — Assessment & Plan Note (Addendum)
significant fatigue and mildly hypotensive, check BNP, may need hospitalization

## 2013-03-10 NOTE — Telephone Encounter (Signed)
Pt worked in this am

## 2013-03-10 NOTE — Patient Instructions (Addendum)
F/u in 3 weeks, call if you need me before  Urine is being tested for infection, THIS IS NEGATIVE  Recent labs suggest worsened kidney function, and dehydration. We will re[peat labs today, based on your complaints, and I believe that you may need hospitalization, you will be contacted by early afternoon  Zofran 4mg  IM today  FfOLLOW THE BRAT diet until your stomach becomes more settled ( you will get this)  Stat chem 7 , BNP, cbc and diff today

## 2013-03-10 NOTE — Assessment & Plan Note (Signed)
Acute illness with marked deterioration in energy level and appetitie, stat cbc and chem 7, also BNP

## 2013-03-10 NOTE — Telephone Encounter (Signed)
Work in this morning pls

## 2013-03-10 NOTE — Progress Notes (Signed)
  Subjective:    Patient ID: [name] [name], female    DOB: 1932-05-20, 79 y.o.   MRN: [id number]  HPI 1 week h/o poor appetitie with loose stools and nausea, took immodium with some relief, but again today stool is runny. She ahs chronic nausea  From gallstones. Feels weak, states urine has foul smell. Based on labs from 6/6, her nephrology appt has been moved forward to tomorrow, absed on todays symptoms, lab will be repeated stat   Review of Systems See HPI Denies recent fever or chills. Denies sinus pressure, nasal congestion, ear pain or sore throat. Denies chest congestion, productive cough or wheezing. Occasional epigastric discomfort, denies palpitations, unable to lie flat     Chronic  joint pain,  and limitation in mobility. Denies headaches, seizures, numbness, or tingling. Denies depression, anxiety or insomnia. Denies skin break down or rash.        Objective:   Physical Exam  Patient alert and oriented and in no cardiopulmonary distress.Ill appearing, appears "fatigued"  HEENT: No facial asymmetry, EOMI, no sinus tenderness,  oropharynx pink and moist.  Neck decreased ROM no adenopathy.  Chest: Clear to auscultation bilaterally.  CVS: S1, S2  murmurs, and click ABD: Soft no localized tenderness or guarding, no palpable organomegaly or masses. Bowel sounds increased Ext: No edema  MS: decreased  ROM spine, shoulders, hips and knees.  Skin: Intact, no ulcerations or rash noted.  Psych: Good eye contact, flat affect. Memory intact  depressed appearing.  CNS: CN 2-12 intact, power, tone and sensation normal throughout.       Assessment & Plan:

## 2013-03-10 NOTE — Addendum Note (Signed)
Addended by: Denman George B on: 03/10/2013 12:57 PM   Modules accepted: Orders

## 2013-03-10 NOTE — Assessment & Plan Note (Signed)
Urinalysis is negative for infection, despite symptom, which is reassuring

## 2013-03-10 NOTE — Telephone Encounter (Signed)
Message forwarded to [facility]. Tressie Stalker, LPN.  [place] pt. Paper chart# [id number] on Dr. Lowella Fairy cart w/ this note.

## 2013-03-10 NOTE — Assessment & Plan Note (Signed)
Uncontrolled, zofran administered at visit

## 2013-03-11 ENCOUNTER — Telehealth: Payer: Self-pay

## 2013-03-11 ENCOUNTER — Ambulatory Visit (INDEPENDENT_AMBULATORY_CARE_PROVIDER_SITE_OTHER): Payer: Medicare Other | Admitting: Surgery

## 2013-03-11 DIAGNOSIS — N184 Chronic kidney disease, stage 4 (severe): Secondary | ICD-10-CM | POA: Diagnosis not present

## 2013-03-11 DIAGNOSIS — Z954 Presence of other heart-valve replacement: Secondary | ICD-10-CM | POA: Diagnosis not present

## 2013-03-11 DIAGNOSIS — E1129 Type 2 diabetes mellitus with other diabetic kidney complication: Secondary | ICD-10-CM | POA: Diagnosis not present

## 2013-03-11 DIAGNOSIS — D649 Anemia, unspecified: Secondary | ICD-10-CM | POA: Diagnosis not present

## 2013-03-11 DIAGNOSIS — E1165 Type 2 diabetes mellitus with hyperglycemia: Secondary | ICD-10-CM | POA: Diagnosis not present

## 2013-03-11 LAB — POCT URINALYSIS DIPSTICK
Bilirubin, UA: NEGATIVE
Blood, UA: NEGATIVE
Glucose, UA: NEGATIVE
Ketones, UA: NEGATIVE
Leukocytes, UA: NEGATIVE
Nitrite, UA: NEGATIVE
Protein, UA: NEGATIVE
Spec Grav, UA: 1.015
Urobilinogen, UA: 0.2
pH, UA: 5.5

## 2013-03-11 NOTE — Addendum Note (Signed)
Addended by: Eual Fines on: 03/11/2013 03:02 PM   Modules accepted: Orders

## 2013-03-11 NOTE — Telephone Encounter (Signed)
error 

## 2013-03-12 ENCOUNTER — Other Ambulatory Visit: Payer: Self-pay | Admitting: Family Medicine

## 2013-03-12 ENCOUNTER — Ambulatory Visit (HOSPITAL_COMMUNITY): Payer: Medicare Other | Admitting: Physical Therapy

## 2013-03-12 ENCOUNTER — Ambulatory Visit (HOSPITAL_COMMUNITY)
Admission: RE | Admit: 2013-03-12 | Discharge: 2013-03-12 | Disposition: A | Discharge status: Home / Self Care | Payer: Medicare Other | Source: Ambulatory Visit | Attending: Family Medicine | Admitting: Family Medicine

## 2013-03-12 DIAGNOSIS — R928 Other abnormal and inconclusive findings on diagnostic imaging of breast: Secondary | ICD-10-CM

## 2013-03-12 DIAGNOSIS — N63 Unspecified lump in unspecified breast: Secondary | ICD-10-CM | POA: Diagnosis not present

## 2013-03-12 NOTE — Telephone Encounter (Signed)
[name] [name] informed of her lab results

## 2013-03-13 ENCOUNTER — Ambulatory Visit (HOSPITAL_COMMUNITY)
Admission: RE | Admit: 2013-03-13 | Discharge: 2013-03-13 | Disposition: A | Discharge status: Home / Self Care | Payer: Medicare Other | Source: Ambulatory Visit | Attending: Cardiovascular Disease | Admitting: Cardiovascular Disease

## 2013-03-13 DIAGNOSIS — Z9889 Other specified postprocedural states: Secondary | ICD-10-CM

## 2013-03-13 DIAGNOSIS — Z954 Presence of other heart-valve replacement: Secondary | ICD-10-CM | POA: Insufficient documentation

## 2013-03-13 DIAGNOSIS — Z952 Presence of prosthetic heart valve: Secondary | ICD-10-CM

## 2013-03-13 DIAGNOSIS — I359 Nonrheumatic aortic valve disorder, unspecified: Secondary | ICD-10-CM | POA: Diagnosis not present

## 2013-03-13 DIAGNOSIS — I447 Left bundle-branch block, unspecified: Secondary | ICD-10-CM | POA: Diagnosis not present

## 2013-03-13 DIAGNOSIS — I509 Heart failure, unspecified: Secondary | ICD-10-CM | POA: Diagnosis not present

## 2013-03-13 NOTE — Progress Notes (Signed)
2D Echo Performed 03/13/2013    Marygrace Drought, RCS

## 2013-03-20 DIAGNOSIS — E1149 Type 2 diabetes mellitus with other diabetic neurological complication: Secondary | ICD-10-CM | POA: Diagnosis not present

## 2013-03-20 DIAGNOSIS — E119 Type 2 diabetes mellitus without complications: Secondary | ICD-10-CM | POA: Diagnosis not present

## 2013-03-24 ENCOUNTER — Other Ambulatory Visit: Payer: Self-pay | Admitting: Family Medicine

## 2013-03-24 ENCOUNTER — Encounter (HOSPITAL_BASED_OUTPATIENT_CLINIC_OR_DEPARTMENT_OTHER): Payer: Medicare Other

## 2013-03-24 DIAGNOSIS — D594 Other nonautoimmune hemolytic anemias: Secondary | ICD-10-CM | POA: Diagnosis not present

## 2013-03-24 DIAGNOSIS — IMO0002 Reserved for concepts with insufficient information to code with codable children: Secondary | ICD-10-CM

## 2013-03-24 DIAGNOSIS — E611 Iron deficiency: Secondary | ICD-10-CM

## 2013-03-24 LAB — CBC WITH DIFFERENTIAL/PLATELET
Basophils Absolute: 0 10*3/uL (ref 0.0–0.1)
Basophils Relative: 0 % (ref 0–1)
Eosinophils Absolute: 0 10*3/uL (ref 0.0–0.7)
Eosinophils Relative: 0 % (ref 0–5)
HCT: 33.1 % — ABNORMAL LOW (ref 36.0–46.0)
Hemoglobin: 10.7 g/dL — ABNORMAL LOW (ref 12.0–15.0)
Lymphocytes Relative: 13 % (ref 12–46)
Lymphs Abs: 1 10*3/uL (ref 0.7–4.0)
MCH: 30.7 pg (ref 26.0–34.0)
MCHC: 32.3 g/dL (ref 30.0–36.0)
MCV: 95.1 fL (ref 78.0–100.0)
Monocytes Absolute: 0.7 10*3/uL (ref 0.1–1.0)
Monocytes Relative: 10 % (ref 3–12)
Neutro Abs: 5.6 10*3/uL (ref 1.7–7.7)
Neutrophils Relative %: 77 % (ref 43–77)
Platelets: 315 10*3/uL (ref 150–400)
RBC: 3.48 MIL/uL — ABNORMAL LOW (ref 3.87–5.11)
RDW: 14.6 % (ref 11.5–15.5)
WBC: 7.3 10*3/uL (ref 4.0–10.5)

## 2013-03-24 LAB — IRON AND TIBC
Iron: 108 ug/dL (ref 42–135)
Saturation Ratios: 24 % (ref 20–55)
TIBC: 447 ug/dL (ref 250–470)
UIBC: 339 ug/dL (ref 125–400)

## 2013-03-24 LAB — FERRITIN: Ferritin: 2381 ng/mL — ABNORMAL HIGH (ref 10–291)

## 2013-03-24 LAB — RETICULOCYTES
RBC.: 3.48 MIL/uL — ABNORMAL LOW (ref 3.87–5.11)
Retic Count, Absolute: 94 10*3/uL (ref 19.0–186.0)
Retic Ct Pct: 2.7 % (ref 0.4–3.1)

## 2013-03-24 NOTE — Progress Notes (Signed)
Labs drawn today for cbc/diff,fer,Iron and IBC,retic

## 2013-03-25 ENCOUNTER — Encounter (INDEPENDENT_AMBULATORY_CARE_PROVIDER_SITE_OTHER): Payer: Self-pay | Admitting: Surgery

## 2013-03-25 ENCOUNTER — Ambulatory Visit (INDEPENDENT_AMBULATORY_CARE_PROVIDER_SITE_OTHER): Payer: Medicare Other | Admitting: Surgery

## 2013-03-25 ENCOUNTER — Other Ambulatory Visit (HOSPITAL_COMMUNITY): Payer: Self-pay | Admitting: Oncology

## 2013-03-25 VITALS — BP 130/76 | HR 64 | Resp 14 | Ht 67.0 in | Wt 224.2 lb

## 2013-03-25 DIAGNOSIS — D242 Benign neoplasm of left breast: Secondary | ICD-10-CM

## 2013-03-25 DIAGNOSIS — D249 Benign neoplasm of unspecified breast: Secondary | ICD-10-CM | POA: Diagnosis not present

## 2013-03-25 DIAGNOSIS — E611 Iron deficiency: Secondary | ICD-10-CM

## 2013-03-25 NOTE — Progress Notes (Signed)
  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 She has multiple medical issues, particularly cardiac area so we decided to  not perform surgery but elected to follow her. She notes that she has not noticed a spontaneous discharge since her last visit here. She has noticed no change in her breasts.   ROS: Recent cardiac workup showed stability  PE General: The patient is alert oriented and healthy-appearing Vital signs: Breasts: The breasts are symmetric in appearance. There not tender. There is no palpable mass on either side. There no visible skin, nipple, or areolar changes noted. I can elicit clear nipple discharge from both breasts. I cannot palpate any subareolar mass on either side. Lymphatics: There is no axillary or supraclavicular adenopathy noted.  Data Reviewed I have reviewed her recent mammogram and ultrasound reports. RADIOLOGY REPORT*  Clinical Data: 79 year old female with a history of a probable  papilloma in the subareolar region of the left breast. Surgical  excision was recommended but due to cardiac issues, the patient is  felt not to be a surgical candidate. Followup study.  DIGITAL DIAGNOSTIC BILATERAL MAMMOGRAM WITH CAD AND LEFT BREAST  ULTRASOUND:  Comparison: With priors  Findings:  ACR Breast Density Category 2: There is a scattered fibroglandular  pattern.  No suspicious mass or malignant-type microcalcifications identified  in either breast.  Mammographic images were processed with CAD.  On physical exam, I do not palpate a mass in the left breast.  Ultrasound is performed, showing a stable intraductal soft tissue  mass measuring 5 mm. A feeding stalk to the nodule appears stable.  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  RECOMMENDATION:  The patient is not a surgical candidate  therefore yearly follow up  would be suggested.  I have discussed the findings and recommendations with the patient.  Results were also provided in writing at the conclusion of the  visit. If applicable, a reminder letter will be sent to the  patient regarding the next appointment.  BI-RADS CATEGORY 4: Suspicious abnormality - biopsy should be  considered.  Original Report Authenticated By: Lillia Mountain, M.D.   Assessment 1. Bilateral nipple discharge with likely intraductal papilloma on the left, stable 2. Significant medical issues including significant cardiac risk factors  Plan I think our original plan is best, that is continued followup in annual mammograms with surgery only if there is a change in the character and amount of discharge or change in the very stable lesions in the breast consistent with papillomas.

## 2013-03-25 NOTE — Patient Instructions (Signed)
See me again in six months

## 2013-03-26 ENCOUNTER — Ambulatory Visit (HOSPITAL_COMMUNITY): Payer: Self-pay

## 2013-03-27 ENCOUNTER — Ambulatory Visit: Payer: Medicare Other | Admitting: Internal Medicine

## 2013-03-27 ENCOUNTER — Encounter (HOSPITAL_BASED_OUTPATIENT_CLINIC_OR_DEPARTMENT_OTHER): Payer: BLUE CROSS/BLUE SHIELD

## 2013-03-27 ENCOUNTER — Encounter (HOSPITAL_COMMUNITY): Payer: BLUE CROSS/BLUE SHIELD | Attending: Internal Medicine

## 2013-03-27 ENCOUNTER — Encounter (HOSPITAL_COMMUNITY): Payer: Self-pay

## 2013-03-27 VITALS — BP 107/60 | HR 71 | Temp 97.8°F | Resp 18 | Wt 226.0 lb

## 2013-03-27 DIAGNOSIS — E611 Iron deficiency: Secondary | ICD-10-CM

## 2013-03-27 DIAGNOSIS — D509 Iron deficiency anemia, unspecified: Secondary | ICD-10-CM | POA: Insufficient documentation

## 2013-03-27 MED ORDER — FERUMOXYTOL INJECTION 510 MG/17 ML
510.0000 mg | Freq: Once | INTRAVENOUS | Status: AC
  Administered 2013-03-27: 510 mg via INTRAVENOUS
  Filled 2013-03-27: qty 17

## 2013-03-27 MED ORDER — SODIUM CHLORIDE 0.9 % IV SOLN
Freq: Once | INTRAVENOUS | Status: AC
  Administered 2013-03-27: 12:00:00 via INTRAVENOUS

## 2013-03-27 NOTE — Patient Instructions (Signed)
[facility] Discharge Instructions  RECOMMENDATIONS MADE BY THE CONSULTANT AND ANY TEST RESULTS WILL BE SENT TO YOUR REFERRING PHYSICIAN.  EXAM FINDINGS BY THE PHYSICIAN TODAY AND SIGNS OR SYMPTOMS TO REPORT TO CLINIC OR PRIMARY PHYSICIAN: Exam findings as discussed by Dr. Ma Hillock.  SPECIAL INSTRUCTIONS/FOLLOW-UP: 1.  Return as scheduled in 3 and 6 months for repeat labwork. 2.  Please keep your appointment in 6 months to see the physician again.  Contact us sooner if questions or concerns.  Thank you for choosing [facility] to provide your oncology and hematology care.  To afford each patient quality time with our providers, please arrive at least 15 minutes before your scheduled appointment time.  With your help, our goal is to use those 15 minutes to complete the necessary work-up to ensure our physicians have the information they need to help with your evaluation and healthcare recommendations.    Effective January 1st, 2014, we ask that you re-schedule your appointment with our physicians should you arrive 10 or more minutes late for your appointment.  We strive to give you quality time with our providers, and arriving late affects you and other patients whose appointments are after yours.    Again, thank you for choosing [facility].  Our hope is that these requests will decrease the amount of time that you wait before being seen by our physicians.       _____________________________________________________________  Should you have questions after your visit to [facility], please contact our office at [phone] between the hours of 8:30 a.m. and 5:00 p.m.  Voicemails left after 4:30 p.m. will not be returned until the following business day.  For prescription refill requests, have your pharmacy contact our office with your prescription refill request.

## 2013-03-27 NOTE — Progress Notes (Signed)
Tolerated iron infusion withoutproblems 

## 2013-03-27 NOTE — Progress Notes (Signed)
[name] Nakayama, MD [address] [place] [address]  Iron deficiency - Plan: CBC with Differential, Iron and TIBC, Ferritin, Ferritin, Ferritin, CBC with Differential, Comprehensive metabolic panel, Ferritin, Iron and TIBC, CANCELED: CBC with Differential, CANCELED: Iron and TIBC, CANCELED: Comprehensive metabolic panel  CURRENT THERAPY: Observation  INTERVAL HISTORY: [name] [name] 79 y.o. 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   Patient states that she is overall doing straight yesterday, she has chronic fatigue and exertional from anemia and cardiac issues which is unchanged. Denies any recent episodes of congestive heart failure. Labs done on 03/24/2013 shows hemoglobin of 10.7, reticulocyte count 2.7%, serum 108, iron saturation 24%, serum ferritin 2381. Patient has already been given dose of IV Feraheme today before I saw her. She denies any progressive dyspnea, orthopnea or PND. No new bone pains. She has intermittent mild itching but no rash. She is on chronic anticoagulation, denies any bleeding symptoms including BRBPR, melena or hematuria. Appetite is good.   Past Medical History  Diagnosis Date  . DJD (degenerative joint disease) of lumbar spine   . Chronic kidney disease, stage I   . Esophageal reflux   . Other and unspecified hyperlipidemia   . Other primary cardiomyopathies   . Obesity, unspecified   . Unspecified hypothyroidism   . DM (diabetes mellitus)   . Essential hypertension, benign   . 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 Pain in joint, pelvic region and thigh   . Insomnia, unspecified   . Iron deficiency 10/04/2011  . IDA (iron deficiency anemia)     Parenteral iron/[name]  . Cancer   . CHF (congestive heart failure)   . COPD (chronic obstructive pulmonary disease)   . Heart murmur   . ICD  (implantable cardiac defibrillator) in place   . Adenomatous polyp of colon 12/16/2002    Dr. [name]/[facility]  . Hyperlipemia   . Hemolysis 09/24/2012    has HYPOTHYROIDISM; DIABETES MELLITUS, TYPE II; HYPERLIPIDEMIA; OBESITY; HYPERTENSION - with Cardiac Abnormalities; Non-ischemic cardiomyopathy with ICD; UNSPECIFIED CARDIOVASCULAR DISEASE; VARICOSE VEINS LOWER EXTREMITIES W/OTH COMPS; GERD; CHOLELITHIASIS; CKD (chronic kidney disease) stage 4, GFR 15-29 ml/min, now improved to CKD stage 3; OTHER SIGN AND SYMPTOM IN BREAST; HIP PAIN, RIGHT; DEGENERATIVE JOINT DISEASE, SPINE; INSOMNIA; NAUSEA; NECK SPASM; HEADACHE; Allergic rhinitis; Low back pain; Iron deficiency; Papilloma of breast; Warfarin-induced coagulopathy; Cardiorenal syndrome with renal failure; S/P AVR (aortic valve replacement); S/P MVR (mitral valve replacement); AICD (automatic cardioverter/defibrillator) present; CKD (chronic kidney disease), stage III - due to DM, HTN and cardiorenal syndrome; Chronic anticoagulation, on coumadin for Mechanical AVR and MVR; Upper leg pain, rt. from use of BSC?  ; COPD (chronic obstructive pulmonary disease); Chronic systolic heart failure; Right thigh pain; H/O adenomatous polyp of colon; High risk medication use; Otalgia of left ear; Routine general medical examination at a health care facility; Hemolysis; Pain in right knee; Dizziness and giddiness; Spinal stenosis of lumbar region; OA (osteoarthritis) of knee; Rotator cuff syndrome of left shoulder; Other malaise and fatigue; and Cystitis, acute on her problem list.     is allergic to niacin and penicillins.  [name] does not currently have medications on file.  Past Surgical History  Procedure Laterality Date  . Thyroidectomy    . Pacemaker placed  2004  . Aortic and mitral valve replacement  2001  .  Right breast cyst removed      benign   . Right cataract removed      2005  . Defibrillator implanted  2006    . Cardiac valve replacement    . Cardiac catheterization    . Insert / replace / remove pacemaker    . Esophagogastroduodenoscopy  06/12/2007    Rourk- normal esophagus, small hiatal hernia, otherwise normal stomach, D1, D2  . Colonoscopy  06/12/2007    Rourk- Normal rectum/Normal colon  . Colonoscopy  12/16/02    small hemorrhoids  . Colonoscopy  06/20/2012    Procedure: COLONOSCOPY;  Surgeon: Daneil Dolin, MD;  Location: [facility];  Service: Endoscopy;  Laterality: N/A;  10:45    Denies any fevers, night sweats, headaches, dizziness, double vision, nausea, vomiting, diarrhea, constipation, chest pain, heart palpitations, shortness of breath, blood in stool, black tarry stool, urinary pain, urinary burning, urinary frequency, hematuria, imbalance or falls.   PHYSICAL EXAMINATION  ECOG PERFORMANCE STATUS: 0 - Asymptomatic  Filed Vitals:   03/27/13 1127  BP: 107/60  Pulse: 71  Temp: 97.8 F (36.6 C)  Resp: 18    GENERAL: Patient is alert, no distress, well nourished, well developed, no acute distress. No pallor SKIN: skin color, texture, turgor are normal, no rashes or discoloration HEENT: EOMs intact. No icterus. No cervical adenopathy.  CVS: S1-S2, regular, clinical of valves noted. No gallop. LUNGS: clear to auscultation, no crepitations ABDOMEN: soft, non-tender, obese, no organomegaly clinically EXTREMITIES: trace pedal edema, no cyanosis  NEURO: alert & oriented x 3 with fluent speech   LABORATORY DATA: CBC    Component Value Date/Time   WBC 7.3 03/24/2013 0933   RBC 3.48* 03/24/2013 0933   HGB 10.7* 03/24/2013 0933   HCT 33.1* 03/24/2013 0933   PLT 315 03/24/2013 0933   MCV 95.1 03/24/2013 0933   MCH 30.7 03/24/2013 0933   MCHC 32.3 03/24/2013 0933   RDW 14.6 03/24/2013 0933   LYMPHSABS 1.0 03/24/2013 0933   MONOABS 0.7 03/24/2013 0933   EOSABS 0.0 03/24/2013 0933   BASOSABS 0.0 03/24/2013 0933      Chemistry      Component Value Date/Time   NA 135  03/10/2013 1120   K 4.9 03/10/2013 1120   CL 96 03/10/2013 1120   CO2 25 03/10/2013 1120   BUN 56* 03/10/2013 1120   CREATININE 2.40* 03/10/2013 1120   CREATININE 2.01* 04/02/2012 0515      Component Value Date/Time   CALCIUM 9.2 03/10/2013 1120   CALCIUM 9.5 06/12/2010 0459   ALKPHOS 39 02/28/2013 1233   AST 24 02/28/2013 1233   ALT 11 02/28/2013 1233   BILITOT 0.4 02/28/2013 1233     Lab Results  Component Value Date   FERRITIN 2381* 03/24/2013     ASSESSMENT/PLAN:  1. 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  2. Anemia of chronic disease element  3. Iron deficiency  Patient overall clinically doing steady. Recent labs on June 30 shows hemoglobin is in a fairly steady range at 10.7 g, patient explained that the anemia is multifactorial but given that she maintains Hb at > 10 g/dL I do not see any indication to consider ESA therapy like Procrit. Patient has already received IV Feraheme today before I saw her, iron studies seem to be doing well and ferritin is elevated, plan therefore is to hold off on further scheduled IV Feraheme treatments and monitor. Will get CBC, ferritin, iron  and TIBC at 3 months from now. Next MD followup in 6 months with CBC, ferritin, iron and TIBC, creatinine, LFTs to make further treatment planning. In between visits, patient was to call of come to ER in case of any worsening anemia symptoms, bleeding issues or acute sickness. She is agreeable to this plan.    Ekam Bonebrake

## 2013-03-28 IMAGING — US US EXTREM LOW VENOUS*R*
1 series · 14 of 24 positions shown · non-contrast
Comparison: None

CLINICAL DATA: Right leg pain and swelling

RIGHT LOWER EXTREMITY VENOUS DUPLEX ULTRASOUND
TECHNIQUE: Gray-scale sonography with graded compression, as well
as color Doppler and duplex ultrasound, were performed to evaluate
the deep venous system of the lower extremity from the level of the
common femoral vein through the popliteal and proximal calf veins.
Spectral Doppler was utilized to evaluate flow at rest and with
distal augmentation maneuvers.

[Series 1: us extrem low venous*right* · 0.09mm/px · 14 of 26 slices shown]
[im 1/26]
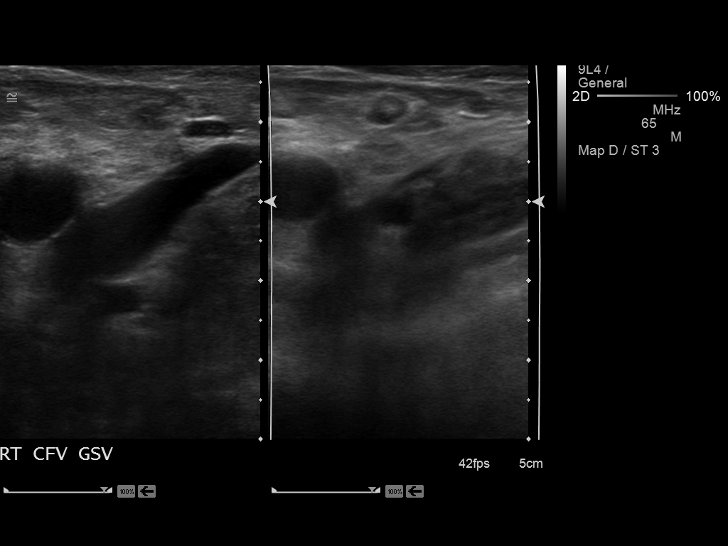
[im 3/26]
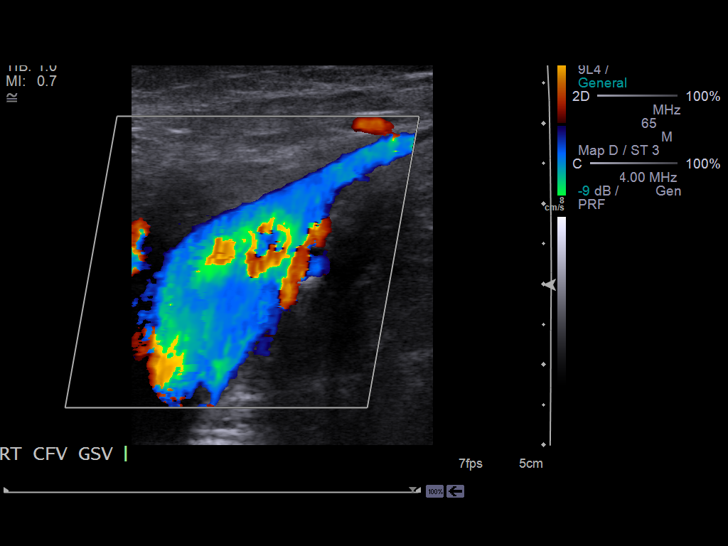
[im 5/26]
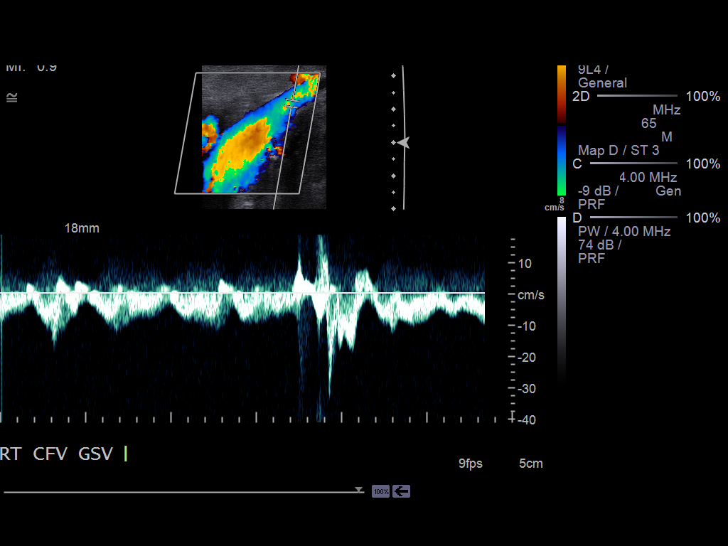
[im 7/26]
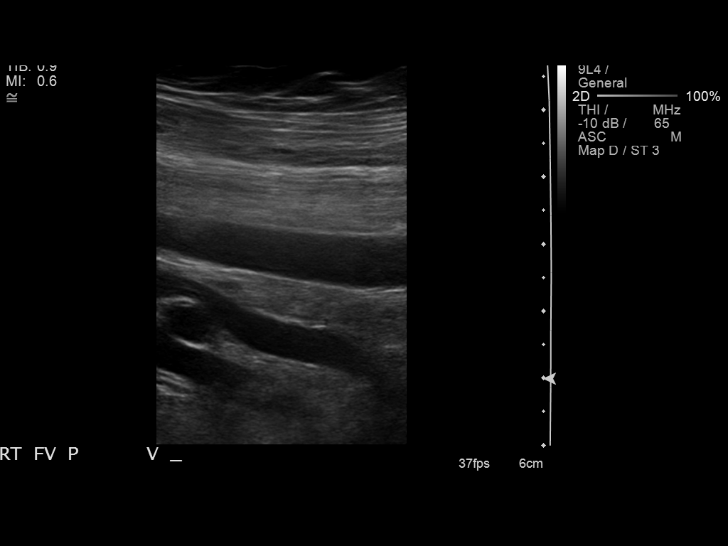
[im 8/26]
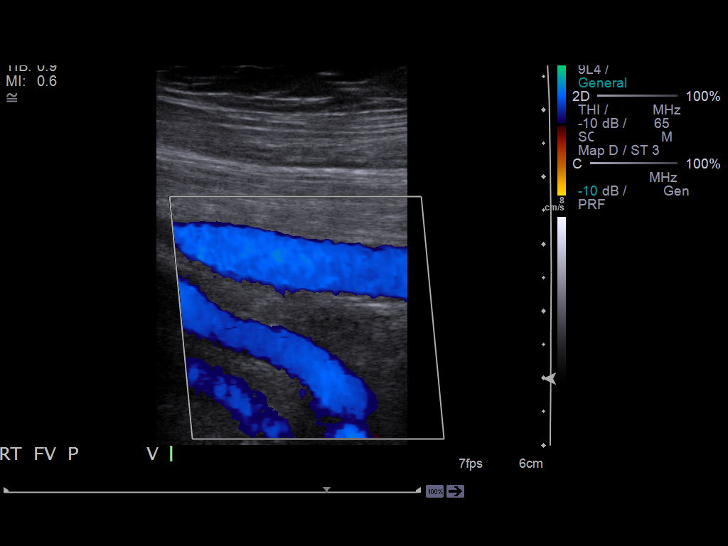
[im 10/26]
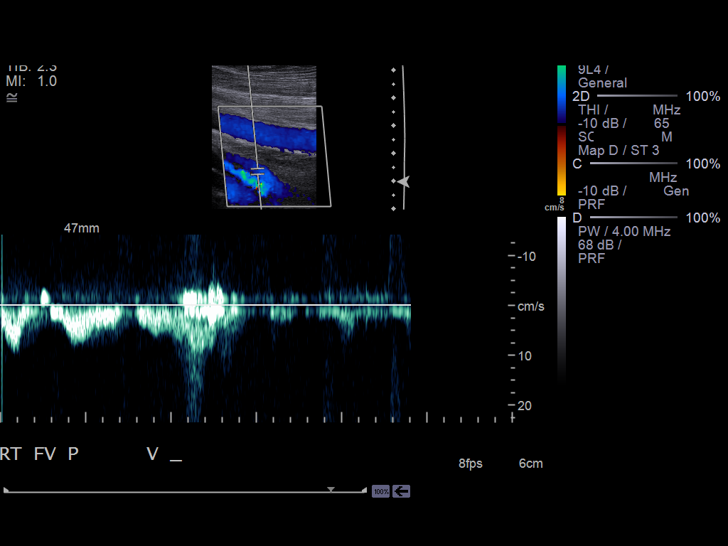
[im 12/26]
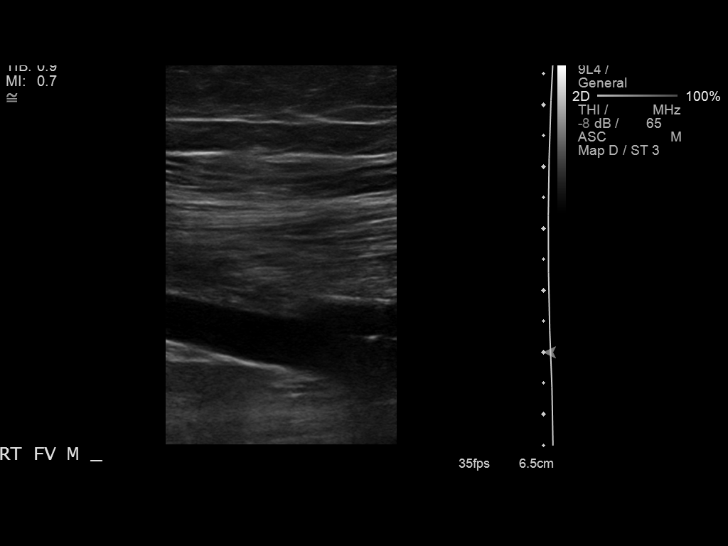
[im 14/26]
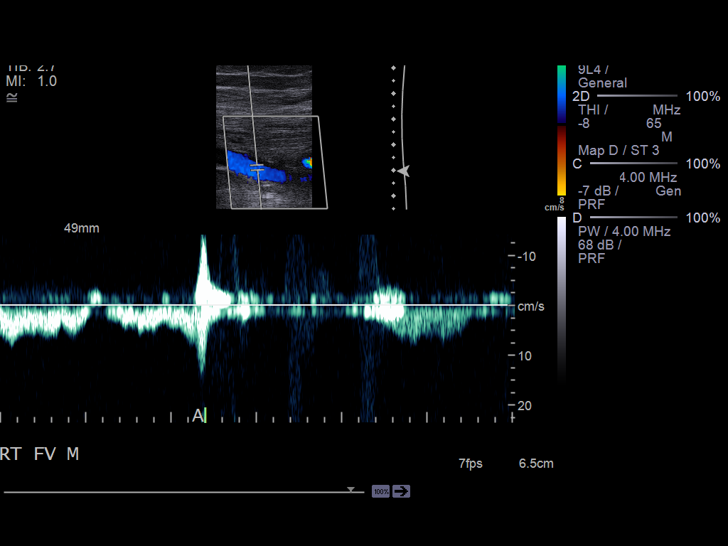
[im 16/26]
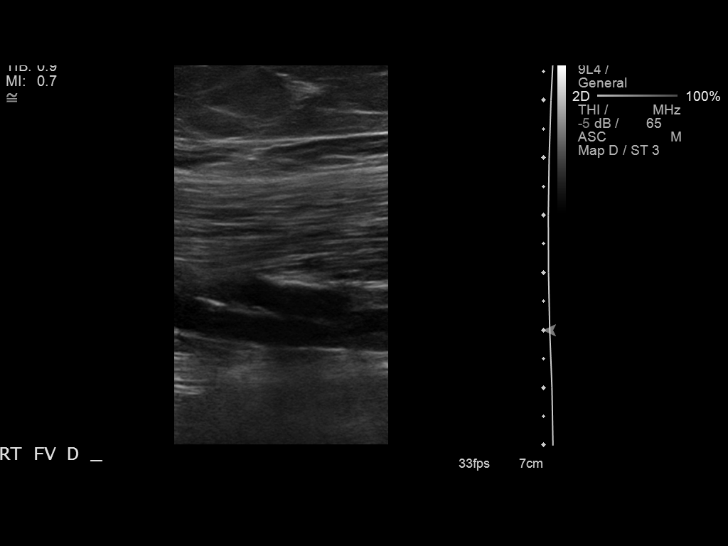
[im 18/26]
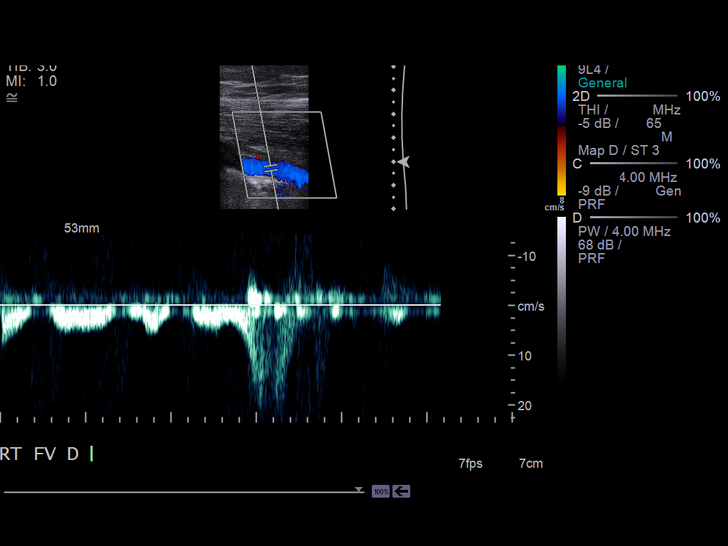
[im 20/26]
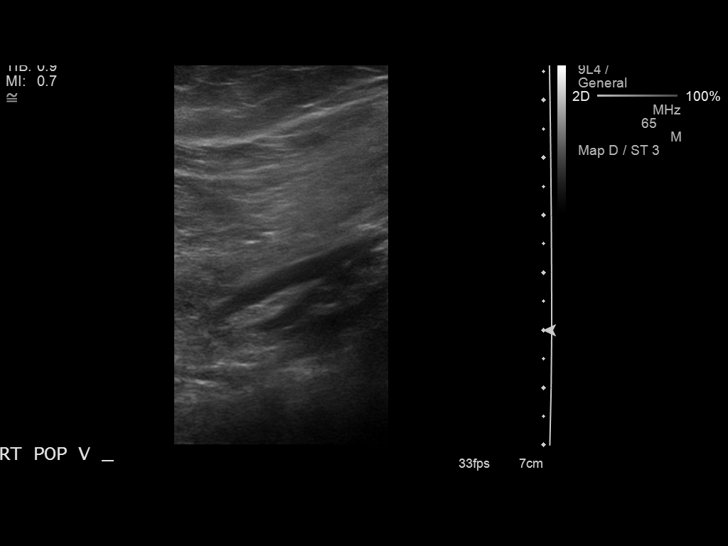
[im 21/26]
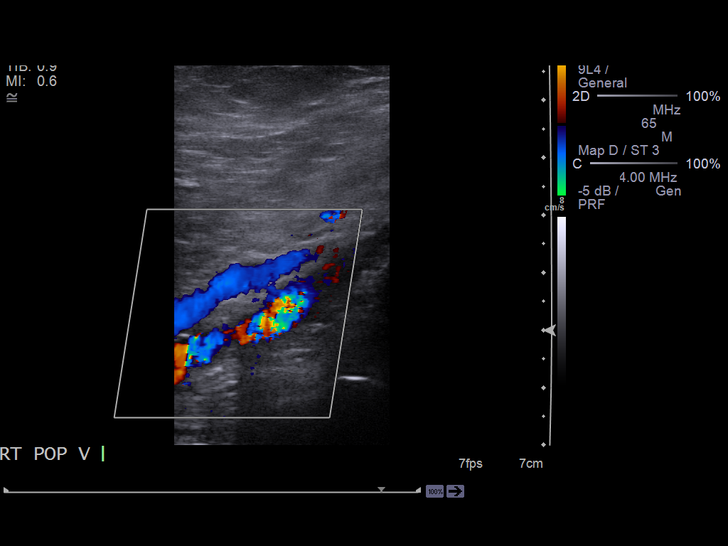
[im 23/26]
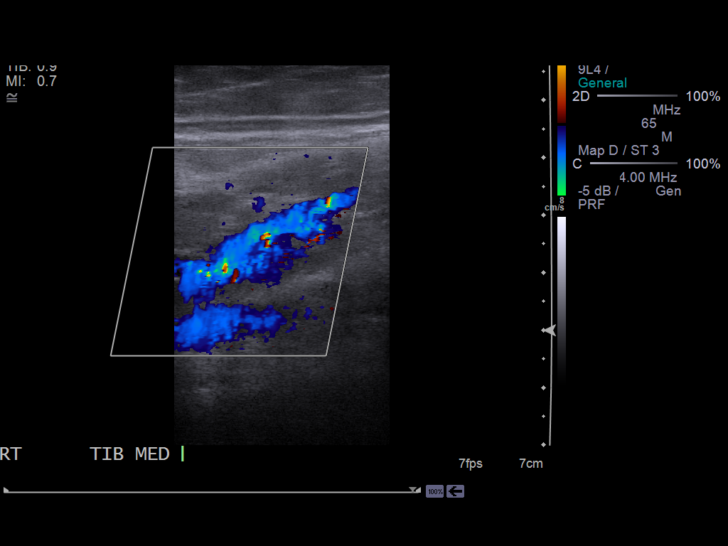
[im 26/26]
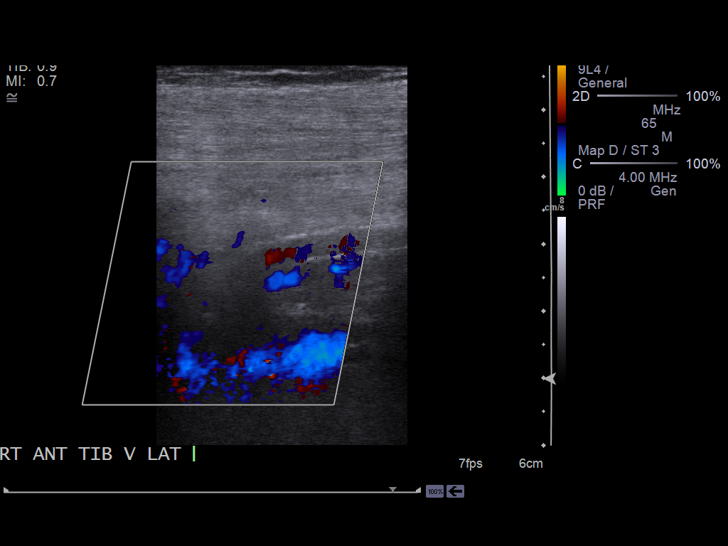

[14 of 24 positions shown; findings below may reference images not displayed]

FINDINGS: Deep venous system patent and compressible from right groin through
popliteal fossa.
Spontaneous venous flow present with intact augmentation and
evidence of respiratory phasicity.
No intraluminal thrombus identified.
Visualized portion of the greater saphenous system unremarkable.
IMPRESSION: No evidence of deep venous thrombosis in the right lower extremity.

## 2013-04-02 ENCOUNTER — Ambulatory Visit: Payer: Medicare Other | Admitting: Family Medicine

## 2013-04-02 ENCOUNTER — Ambulatory Visit (INDEPENDENT_AMBULATORY_CARE_PROVIDER_SITE_OTHER): Payer: Medicare Other | Admitting: Pharmacist Clinician (PhC)/ Clinical Pharmacy Specialist

## 2013-04-02 VITALS — BP 128/60 | HR 92

## 2013-04-02 DIAGNOSIS — Z954 Presence of other heart-valve replacement: Secondary | ICD-10-CM | POA: Diagnosis not present

## 2013-04-02 DIAGNOSIS — Z7901 Long term (current) use of anticoagulants: Secondary | ICD-10-CM

## 2013-04-02 DIAGNOSIS — Z952 Presence of prosthetic heart valve: Secondary | ICD-10-CM

## 2013-04-02 LAB — POCT INR: INR: 3.8

## 2013-04-03 ENCOUNTER — Other Ambulatory Visit: Payer: Self-pay

## 2013-04-03 DIAGNOSIS — M79609 Pain in unspecified limb: Secondary | ICD-10-CM | POA: Diagnosis not present

## 2013-04-03 DIAGNOSIS — G600 Hereditary motor and sensory neuropathy: Secondary | ICD-10-CM | POA: Diagnosis not present

## 2013-04-07 ENCOUNTER — Other Ambulatory Visit: Payer: Self-pay | Admitting: Cardiovascular Disease

## 2013-04-07 DIAGNOSIS — R5381 Other malaise: Secondary | ICD-10-CM | POA: Diagnosis not present

## 2013-04-07 DIAGNOSIS — R0602 Shortness of breath: Secondary | ICD-10-CM | POA: Diagnosis not present

## 2013-04-07 DIAGNOSIS — Z4502 Encounter for adjustment and management of automatic implantable cardiac defibrillator: Secondary | ICD-10-CM | POA: Diagnosis not present

## 2013-04-07 DIAGNOSIS — Z7901 Long term (current) use of anticoagulants: Secondary | ICD-10-CM | POA: Diagnosis not present

## 2013-04-07 DIAGNOSIS — R6889 Other general symptoms and signs: Secondary | ICD-10-CM | POA: Diagnosis not present

## 2013-04-07 DIAGNOSIS — E039 Hypothyroidism, unspecified: Secondary | ICD-10-CM | POA: Diagnosis not present

## 2013-04-07 DIAGNOSIS — N289 Disorder of kidney and ureter, unspecified: Secondary | ICD-10-CM | POA: Diagnosis not present

## 2013-04-07 DIAGNOSIS — I359 Nonrheumatic aortic valve disorder, unspecified: Secondary | ICD-10-CM | POA: Diagnosis not present

## 2013-04-07 DIAGNOSIS — R5383 Other fatigue: Secondary | ICD-10-CM | POA: Diagnosis not present

## 2013-04-07 LAB — PACEMAKER DEVICE OBSERVATION

## 2013-04-08 ENCOUNTER — Encounter: Payer: Self-pay | Admitting: Cardiovascular Disease

## 2013-04-08 ENCOUNTER — Encounter: Payer: Self-pay | Admitting: Family Medicine

## 2013-04-08 ENCOUNTER — Ambulatory Visit (INDEPENDENT_AMBULATORY_CARE_PROVIDER_SITE_OTHER): Payer: Medicare Other | Admitting: Family Medicine

## 2013-04-08 VITALS — BP 122/70 | HR 89 | Resp 16 | Ht 66.0 in | Wt 224.8 lb

## 2013-04-08 DIAGNOSIS — R7303 Prediabetes: Secondary | ICD-10-CM

## 2013-04-08 DIAGNOSIS — I10 Essential (primary) hypertension: Secondary | ICD-10-CM

## 2013-04-08 DIAGNOSIS — H01009 Unspecified blepharitis unspecified eye, unspecified eyelid: Secondary | ICD-10-CM

## 2013-04-08 DIAGNOSIS — R7309 Other abnormal glucose: Secondary | ICD-10-CM | POA: Diagnosis not present

## 2013-04-08 DIAGNOSIS — E039 Hypothyroidism, unspecified: Secondary | ICD-10-CM

## 2013-04-08 DIAGNOSIS — E785 Hyperlipidemia, unspecified: Secondary | ICD-10-CM | POA: Diagnosis not present

## 2013-04-08 DIAGNOSIS — H00029 Hordeolum internum unspecified eye, unspecified eyelid: Secondary | ICD-10-CM | POA: Diagnosis not present

## 2013-04-08 DIAGNOSIS — H01006 Unspecified blepharitis left eye, unspecified eyelid: Secondary | ICD-10-CM

## 2013-04-08 LAB — COMPREHENSIVE METABOLIC PANEL
ALT: 11 U/L (ref 0–35)
AST: 25 U/L (ref 0–37)
Albumin: 4.2 g/dL (ref 3.5–5.2)
Alkaline Phosphatase: 36 U/L — ABNORMAL LOW (ref 39–117)
BUN: 51 mg/dL — ABNORMAL HIGH (ref 6–23)
CO2: 28 mEq/L (ref 19–32)
Calcium: 9.6 mg/dL (ref 8.4–10.5)
Chloride: 99 mEq/L (ref 96–112)
Creat: 2.03 mg/dL — ABNORMAL HIGH (ref 0.50–1.10)
Glucose, Bld: 86 mg/dL (ref 70–99)
Potassium: 5.4 mEq/L — ABNORMAL HIGH (ref 3.5–5.3)
Sodium: 139 mEq/L (ref 135–145)
Total Bilirubin: 0.5 mg/dL (ref 0.3–1.2)
Total Protein: 7.4 g/dL (ref 6.0–8.3)

## 2013-04-08 LAB — BRAIN NATRIURETIC PEPTIDE: Brain Natriuretic Peptide: 161.2 pg/mL — ABNORMAL HIGH (ref 0.0–100.0)

## 2013-04-08 LAB — CBC WITH DIFFERENTIAL/PLATELET
Basophils Absolute: 0 10*3/uL (ref 0.0–0.1)
Basophils Relative: 0 % (ref 0–1)
Eosinophils Absolute: 0 10*3/uL (ref 0.0–0.7)
Eosinophils Relative: 0 % (ref 0–5)
HCT: 30.7 % — ABNORMAL LOW (ref 36.0–46.0)
Hemoglobin: 10 g/dL — ABNORMAL LOW (ref 12.0–15.0)
Lymphocytes Relative: 15 % (ref 12–46)
Lymphs Abs: 1 10*3/uL (ref 0.7–4.0)
MCH: 29.7 pg (ref 26.0–34.0)
MCHC: 32.6 g/dL (ref 30.0–36.0)
MCV: 91.1 fL (ref 78.0–100.0)
Monocytes Absolute: 0.6 10*3/uL (ref 0.1–1.0)
Monocytes Relative: 9 % (ref 3–12)
Neutro Abs: 5.2 10*3/uL (ref 1.7–7.7)
Neutrophils Relative %: 76 % (ref 43–77)
Platelets: 267 10*3/uL (ref 150–400)
RBC: 3.37 MIL/uL — ABNORMAL LOW (ref 3.87–5.11)
RDW: 15 % (ref 11.5–15.5)
WBC: 6.9 10*3/uL (ref 4.0–10.5)

## 2013-04-08 LAB — PROTIME-INR
INR: 2.01 — ABNORMAL HIGH (ref <1.50)
Prothrombin Time: 22.1 seconds — ABNORMAL HIGH (ref 11.6–15.2)

## 2013-04-08 LAB — TSH: TSH: 1.063 u[IU]/mL (ref 0.350–4.500)

## 2013-04-08 NOTE — Patient Instructions (Addendum)
F/u in October as before  You need  To go directly  To the eye Doc we have called

## 2013-04-08 NOTE — Progress Notes (Signed)
  Subjective:    Patient ID: [name] [name], female    DOB: 11-04-1931, 79 y.o.   MRN: [id number]  HPI  Swelling and redness of left upper eyelid x 3 days, also started having itching in the left eye since yesterday. States she has noted reduced vision in left eye  Feels better than at last visit, when she had both cardiology and nephrology consultation to try to improve her overall wellbeing. No recent fever, no sinus pressure or draiage  Review of Systems See HPI Denies recent fever or chills. Denies sinus pressure, nasal congestion, ear pain or sore throat. Denies chest congestion, productive cough or wheezing. Denies chest pains, palpitations and leg swelling Denies abdominal pain, nausea, vomiting,diarrhea or constipation.   Denies dysuria, frequency, hesitancy or incontinence. Chronic joint pain, swelling and limitation in mobility.       Objective:   Physical Exam  Patient alert and oriented and in no cardiopulmonary distress.  HEENT: No facial asymmetry, EOMI, no sinus tenderness,  oropharynx pink and moist.  Neck supple no adenopathy.Marked erythema and swelling of left upper eyelid, causing half way closure of left eye. Sticky discharge noted from left eye also  Chest: Clear to auscultation bilaterally.  CVS: S1, S2 systolic murmur, no S3.  ABD: Soft non tender. Bowel sounds normal.  Ext: No edema  MS: Adequate though reduced  ROM spine, shoulders, hips and knees.   CNS: CN 2-12 intact, power, tone and sensation normal throughout.       Assessment & Plan:

## 2013-04-13 ENCOUNTER — Telehealth: Payer: Self-pay | Admitting: Family Medicine

## 2013-04-13 DIAGNOSIS — E785 Hyperlipidemia, unspecified: Secondary | ICD-10-CM

## 2013-04-13 DIAGNOSIS — R7301 Impaired fasting glucose: Secondary | ICD-10-CM

## 2013-04-13 DIAGNOSIS — I10 Essential (primary) hypertension: Secondary | ICD-10-CM

## 2013-04-13 DIAGNOSIS — H01006 Unspecified blepharitis left eye, unspecified eyelid: Secondary | ICD-10-CM | POA: Insufficient documentation

## 2013-04-13 NOTE — Assessment & Plan Note (Signed)
Hyperlipidemia:Low fat diet discussed and encouraged.   updated lab in October

## 2013-04-13 NOTE — Assessment & Plan Note (Signed)
Acute onset with reported loss of vision, urgent opthal eval

## 2013-04-13 NOTE — Assessment & Plan Note (Signed)
Normal TSH on no supplement

## 2013-04-13 NOTE — Telephone Encounter (Signed)
Pls let [name] [name] know she will need fasting lipid and HBa1C  For October and pls order,aalso order CMP and EGFr, thanks. Either mail or send in lab order or have her collect Also enquire about left eyelid, hope much better also her vision

## 2013-04-13 NOTE — Assessment & Plan Note (Signed)
Controlled, no change in medication DASH diet and commitment to daily physical activity for a minimum of 30 minutes discussed and encouraged, as a part of hypertension management. The importance of attaining a healthy weight is also discussed.  

## 2013-04-13 NOTE — Assessment & Plan Note (Signed)
Updated lab in October. Diet controlled at this time

## 2013-04-14 NOTE — Telephone Encounter (Signed)
Called patient no answer. Pt coming to appt today with brother. Will give her lab order then and ask about her eye

## 2013-04-14 NOTE — Addendum Note (Signed)
Addended by: Eual Fines on: 04/14/2013 10:48 AM   Modules accepted: Orders

## 2013-04-21 DIAGNOSIS — N189 Chronic kidney disease, unspecified: Secondary | ICD-10-CM | POA: Diagnosis not present

## 2013-04-21 DIAGNOSIS — D649 Anemia, unspecified: Secondary | ICD-10-CM | POA: Diagnosis not present

## 2013-04-21 DIAGNOSIS — E559 Vitamin D deficiency, unspecified: Secondary | ICD-10-CM | POA: Diagnosis not present

## 2013-04-21 DIAGNOSIS — Z79899 Other long term (current) drug therapy: Secondary | ICD-10-CM | POA: Diagnosis not present

## 2013-04-21 DIAGNOSIS — R809 Proteinuria, unspecified: Secondary | ICD-10-CM | POA: Diagnosis not present

## 2013-04-23 DIAGNOSIS — D649 Anemia, unspecified: Secondary | ICD-10-CM | POA: Diagnosis not present

## 2013-04-23 DIAGNOSIS — I10 Essential (primary) hypertension: Secondary | ICD-10-CM | POA: Diagnosis not present

## 2013-04-23 DIAGNOSIS — N184 Chronic kidney disease, stage 4 (severe): Secondary | ICD-10-CM | POA: Diagnosis not present

## 2013-04-24 ENCOUNTER — Other Ambulatory Visit: Payer: Self-pay | Admitting: Pharmacist Clinician (PhC)/ Clinical Pharmacy Specialist

## 2013-04-24 DIAGNOSIS — Z954 Presence of other heart-valve replacement: Secondary | ICD-10-CM | POA: Diagnosis not present

## 2013-04-24 DIAGNOSIS — Z7901 Long term (current) use of anticoagulants: Secondary | ICD-10-CM | POA: Diagnosis not present

## 2013-04-25 ENCOUNTER — Telehealth: Payer: Self-pay | Admitting: Cardiovascular Disease

## 2013-04-25 LAB — PROTIME-INR
INR: 2.77 — ABNORMAL HIGH (ref <1.50)
Prothrombin Time: 28.1 seconds — ABNORMAL HIGH (ref 11.6–15.2)

## 2013-04-25 NOTE — Telephone Encounter (Signed)
Calling to get her lab results .Marland KitchenPlease call..   Thanks

## 2013-04-28 ENCOUNTER — Ambulatory Visit (INDEPENDENT_AMBULATORY_CARE_PROVIDER_SITE_OTHER): Payer: Self-pay | Admitting: Pharmacist

## 2013-04-28 DIAGNOSIS — Z952 Presence of prosthetic heart valve: Secondary | ICD-10-CM

## 2013-04-28 DIAGNOSIS — Z7901 Long term (current) use of anticoagulants: Secondary | ICD-10-CM

## 2013-04-28 DIAGNOSIS — Z954 Presence of other heart-valve replacement: Secondary | ICD-10-CM

## 2013-04-28 NOTE — Telephone Encounter (Signed)
Spoke to patient regarding her INR results.  She is aware and will recheck in 3 weeks.

## 2013-04-28 NOTE — Telephone Encounter (Signed)
ok 

## 2013-05-09 ENCOUNTER — Other Ambulatory Visit: Payer: Self-pay | Admitting: Family Medicine

## 2013-05-09 MED ORDER — CLINDAMYCIN HCL 300 MG PO CAPS: ORAL_CAPSULE | ORAL | Status: DC

## 2013-05-14 ENCOUNTER — Other Ambulatory Visit: Payer: Self-pay | Admitting: Pharmacist Clinician (PhC)/ Clinical Pharmacy Specialist

## 2013-05-14 DIAGNOSIS — Z7901 Long term (current) use of anticoagulants: Secondary | ICD-10-CM | POA: Diagnosis not present

## 2013-05-14 DIAGNOSIS — Z954 Presence of other heart-valve replacement: Secondary | ICD-10-CM | POA: Diagnosis not present

## 2013-05-14 LAB — PROTIME-INR
INR: 2.59 — ABNORMAL HIGH (ref <1.50)
Prothrombin Time: 26.7 seconds — ABNORMAL HIGH (ref 11.6–15.2)

## 2013-05-15 ENCOUNTER — Ambulatory Visit (INDEPENDENT_AMBULATORY_CARE_PROVIDER_SITE_OTHER): Payer: Medicare Other | Admitting: Pharmacist Clinician (PhC)/ Clinical Pharmacy Specialist

## 2013-05-15 DIAGNOSIS — Z954 Presence of other heart-valve replacement: Secondary | ICD-10-CM

## 2013-05-15 DIAGNOSIS — Z952 Presence of prosthetic heart valve: Secondary | ICD-10-CM

## 2013-05-15 DIAGNOSIS — Z7901 Long term (current) use of anticoagulants: Secondary | ICD-10-CM

## 2013-05-21 ENCOUNTER — Other Ambulatory Visit: Payer: Self-pay

## 2013-05-21 MED ORDER — LANOXIN 125 MCG PO TABS: 125.0000 ug | ORAL_TABLET | Freq: Every day | ORAL | Status: DC

## 2013-05-21 MED ORDER — TOPROL XL 100 MG PO TB24: 100.0000 mg | ORAL_TABLET | Freq: Every day | ORAL | Status: DC

## 2013-05-23 DIAGNOSIS — E119 Type 2 diabetes mellitus without complications: Secondary | ICD-10-CM | POA: Diagnosis not present

## 2013-05-23 DIAGNOSIS — H40019 Open angle with borderline findings, low risk, unspecified eye: Secondary | ICD-10-CM | POA: Diagnosis not present

## 2013-05-23 DIAGNOSIS — H3589 Other specified retinal disorders: Secondary | ICD-10-CM | POA: Diagnosis not present

## 2013-05-29 DIAGNOSIS — E119 Type 2 diabetes mellitus without complications: Secondary | ICD-10-CM | POA: Diagnosis not present

## 2013-05-29 DIAGNOSIS — E1149 Type 2 diabetes mellitus with other diabetic neurological complication: Secondary | ICD-10-CM | POA: Diagnosis not present

## 2013-06-03 DIAGNOSIS — M25569 Pain in unspecified knee: Secondary | ICD-10-CM | POA: Diagnosis not present

## 2013-06-05 ENCOUNTER — Other Ambulatory Visit: Payer: Self-pay

## 2013-06-05 MED ORDER — EZETIMIBE-SIMVASTATIN 10-20 MG PO TABS: 1.0000 | ORAL_TABLET | ORAL | Status: DC

## 2013-06-06 ENCOUNTER — Other Ambulatory Visit: Payer: Self-pay

## 2013-06-06 MED ORDER — EZETIMIBE-SIMVASTATIN 10-20 MG PO TABS: 1.0000 | ORAL_TABLET | ORAL | Status: DC

## 2013-06-10 DIAGNOSIS — I359 Nonrheumatic aortic valve disorder, unspecified: Secondary | ICD-10-CM | POA: Diagnosis not present

## 2013-06-10 DIAGNOSIS — I509 Heart failure, unspecified: Secondary | ICD-10-CM | POA: Diagnosis not present

## 2013-06-10 DIAGNOSIS — Z4502 Encounter for adjustment and management of automatic implantable cardiac defibrillator: Secondary | ICD-10-CM | POA: Diagnosis not present

## 2013-06-10 DIAGNOSIS — I447 Left bundle-branch block, unspecified: Secondary | ICD-10-CM | POA: Diagnosis not present

## 2013-06-10 LAB — PACEMAKER DEVICE OBSERVATION

## 2013-06-12 ENCOUNTER — Other Ambulatory Visit: Payer: Self-pay | Admitting: Cardiovascular Disease

## 2013-06-12 ENCOUNTER — Other Ambulatory Visit: Payer: Self-pay | Admitting: Pharmacist Clinician (PhC)/ Clinical Pharmacy Specialist

## 2013-06-12 DIAGNOSIS — R5381 Other malaise: Secondary | ICD-10-CM | POA: Diagnosis not present

## 2013-06-12 DIAGNOSIS — R0602 Shortness of breath: Secondary | ICD-10-CM | POA: Diagnosis not present

## 2013-06-12 DIAGNOSIS — Z954 Presence of other heart-valve replacement: Secondary | ICD-10-CM | POA: Diagnosis not present

## 2013-06-12 DIAGNOSIS — R6889 Other general symptoms and signs: Secondary | ICD-10-CM | POA: Diagnosis not present

## 2013-06-12 DIAGNOSIS — R5383 Other fatigue: Secondary | ICD-10-CM | POA: Diagnosis not present

## 2013-06-12 DIAGNOSIS — T460X1A Poisoning by cardiac-stimulant glycosides and drugs of similar action, accidental (unintentional), initial encounter: Secondary | ICD-10-CM | POA: Diagnosis not present

## 2013-06-12 DIAGNOSIS — D689 Coagulation defect, unspecified: Secondary | ICD-10-CM | POA: Diagnosis not present

## 2013-06-12 DIAGNOSIS — Z7901 Long term (current) use of anticoagulants: Secondary | ICD-10-CM | POA: Diagnosis not present

## 2013-06-12 LAB — PROTIME-INR
INR: 2.36 — ABNORMAL HIGH (ref <1.50)
Prothrombin Time: 24.9 seconds — ABNORMAL HIGH (ref 11.6–15.2)

## 2013-06-12 LAB — CBC WITH DIFFERENTIAL/PLATELET
Basophils Absolute: 0 10*3/uL (ref 0.0–0.1)
Basophils Relative: 0 % (ref 0–1)
Eosinophils Absolute: 0 10*3/uL (ref 0.0–0.7)
Eosinophils Relative: 0 % (ref 0–5)
HCT: 31.2 % — ABNORMAL LOW (ref 36.0–46.0)
Hemoglobin: 10.2 g/dL — ABNORMAL LOW (ref 12.0–15.0)
Lymphocytes Relative: 17 % (ref 12–46)
Lymphs Abs: 1.1 10*3/uL (ref 0.7–4.0)
MCH: 30.4 pg (ref 26.0–34.0)
MCHC: 32.7 g/dL (ref 30.0–36.0)
MCV: 93.1 fL (ref 78.0–100.0)
Monocytes Absolute: 0.5 10*3/uL (ref 0.1–1.0)
Monocytes Relative: 8 % (ref 3–12)
Neutro Abs: 4.9 10*3/uL (ref 1.7–7.7)
Neutrophils Relative %: 75 % (ref 43–77)
Platelets: 233 10*3/uL (ref 150–400)
RBC: 3.35 MIL/uL — ABNORMAL LOW (ref 3.87–5.11)
RDW: 14.9 % (ref 11.5–15.5)
WBC: 6.5 10*3/uL (ref 4.0–10.5)

## 2013-06-13 LAB — COMPREHENSIVE METABOLIC PANEL
ALT: 12 U/L (ref 0–35)
AST: 23 U/L (ref 0–37)
Albumin: 4.1 g/dL (ref 3.5–5.2)
Alkaline Phosphatase: 35 U/L — ABNORMAL LOW (ref 39–117)
BUN: 59 mg/dL — ABNORMAL HIGH (ref 6–23)
CO2: 32 mEq/L (ref 19–32)
Calcium: 9.4 mg/dL (ref 8.4–10.5)
Chloride: 96 mEq/L (ref 96–112)
Creat: 2.43 mg/dL — ABNORMAL HIGH (ref 0.50–1.10)
Glucose, Bld: 136 mg/dL — ABNORMAL HIGH (ref 70–99)
Potassium: 4.1 mEq/L (ref 3.5–5.3)
Sodium: 139 mEq/L (ref 135–145)
Total Bilirubin: 0.4 mg/dL (ref 0.3–1.2)
Total Protein: 7.3 g/dL (ref 6.0–8.3)

## 2013-06-13 LAB — BRAIN NATRIURETIC PEPTIDE: Brain Natriuretic Peptide: 159 pg/mL — ABNORMAL HIGH (ref 0.0–100.0)

## 2013-06-13 LAB — DIGOXIN LEVEL: Digoxin Level: 0.4 ng/mL — ABNORMAL LOW (ref 0.8–2.0)

## 2013-06-13 LAB — TSH: TSH: 0.875 u[IU]/mL (ref 0.350–4.500)

## 2013-06-16 ENCOUNTER — Telehealth: Payer: Self-pay | Admitting: Pharmacist Clinician (PhC)/ Clinical Pharmacy Specialist

## 2013-06-16 ENCOUNTER — Ambulatory Visit (INDEPENDENT_AMBULATORY_CARE_PROVIDER_SITE_OTHER): Payer: Medicare Other | Admitting: Pharmacist Clinician (PhC)/ Clinical Pharmacy Specialist

## 2013-06-16 DIAGNOSIS — Z954 Presence of other heart-valve replacement: Secondary | ICD-10-CM

## 2013-06-16 DIAGNOSIS — Z7901 Long term (current) use of anticoagulants: Secondary | ICD-10-CM

## 2013-06-16 DIAGNOSIS — Z952 Presence of prosthetic heart valve: Secondary | ICD-10-CM

## 2013-06-16 NOTE — Telephone Encounter (Signed)
Needs protime results

## 2013-06-25 ENCOUNTER — Telehealth: Payer: Self-pay | Admitting: Pharmacist Clinician (PhC)/ Clinical Pharmacy Specialist

## 2013-06-25 NOTE — Telephone Encounter (Signed)
Spoke with patient, she will get INR at [facility] office, appointment already set.  LM with Tobin Chad to get her device checks.

## 2013-06-25 NOTE — Telephone Encounter (Signed)
Please call.

## 2013-06-27 ENCOUNTER — Encounter (HOSPITAL_COMMUNITY): Payer: Medicare Other | Attending: Internal Medicine

## 2013-06-27 ENCOUNTER — Other Ambulatory Visit (HOSPITAL_COMMUNITY): Payer: Self-pay | Admitting: Oncology

## 2013-06-27 DIAGNOSIS — E611 Iron deficiency: Secondary | ICD-10-CM

## 2013-06-27 DIAGNOSIS — D509 Iron deficiency anemia, unspecified: Secondary | ICD-10-CM

## 2013-06-27 LAB — CBC WITH DIFFERENTIAL/PLATELET
Basophils Absolute: 0 10*3/uL (ref 0.0–0.1)
Basophils Relative: 0 % (ref 0–1)
Eosinophils Absolute: 0 10*3/uL (ref 0.0–0.7)
Eosinophils Relative: 0 % (ref 0–5)
HCT: 34.7 % — ABNORMAL LOW (ref 36.0–46.0)
Hemoglobin: 11.2 g/dL — ABNORMAL LOW (ref 12.0–15.0)
Lymphocytes Relative: 22 % (ref 12–46)
Lymphs Abs: 1.4 10*3/uL (ref 0.7–4.0)
MCH: 31.1 pg (ref 26.0–34.0)
MCHC: 32.3 g/dL (ref 30.0–36.0)
MCV: 96.4 fL (ref 78.0–100.0)
Monocytes Absolute: 0.6 10*3/uL (ref 0.1–1.0)
Monocytes Relative: 9 % (ref 3–12)
Neutro Abs: 4.4 10*3/uL (ref 1.7–7.7)
Neutrophils Relative %: 69 % (ref 43–77)
Platelets: 213 10*3/uL (ref 150–400)
RBC: 3.6 MIL/uL — ABNORMAL LOW (ref 3.87–5.11)
RDW: 14.2 % (ref 11.5–15.5)
WBC: 6.4 10*3/uL (ref 4.0–10.5)

## 2013-06-27 LAB — IRON AND TIBC
Iron: 136 ug/dL — ABNORMAL HIGH (ref 42–135)
Saturation Ratios: 32 % (ref 20–55)
TIBC: 426 ug/dL (ref 250–470)
UIBC: 290 ug/dL (ref 125–400)

## 2013-06-27 LAB — RETICULOCYTES
RBC.: 3.6 MIL/uL — ABNORMAL LOW (ref 3.87–5.11)
Retic Count, Absolute: 93.6 10*3/uL (ref 19.0–186.0)
Retic Ct Pct: 2.6 % (ref 0.4–3.1)

## 2013-06-27 LAB — FERRITIN: Ferritin: 1764 ng/mL — ABNORMAL HIGH (ref 10–291)

## 2013-06-27 NOTE — Progress Notes (Signed)
Labs drawn today for cbc/diff,Iron and IBC,ferr,retic,

## 2013-06-30 ENCOUNTER — Other Ambulatory Visit: Payer: Self-pay

## 2013-06-30 MED ORDER — LEVOTHYROXINE SODIUM 75 MCG PO TABS: 75.0000 ug | ORAL_TABLET | Freq: Every day | ORAL | Status: DC

## 2013-07-01 ENCOUNTER — Other Ambulatory Visit: Payer: Self-pay | Admitting: Family Medicine

## 2013-07-01 DIAGNOSIS — M25569 Pain in unspecified knee: Secondary | ICD-10-CM | POA: Diagnosis not present

## 2013-07-01 DIAGNOSIS — M25519 Pain in unspecified shoulder: Secondary | ICD-10-CM | POA: Diagnosis not present

## 2013-07-03 ENCOUNTER — Other Ambulatory Visit: Payer: Self-pay | Admitting: Family Medicine

## 2013-07-04 ENCOUNTER — Other Ambulatory Visit: Payer: Self-pay

## 2013-07-04 MED ORDER — GABAPENTIN 300 MG PO CAPS: ORAL_CAPSULE | ORAL | Status: DC

## 2013-07-04 MED ORDER — FLUTICASONE-SALMETEROL 100-50 MCG/DOSE IN AEPB: INHALATION_SPRAY | RESPIRATORY_TRACT | Status: DC

## 2013-07-07 DIAGNOSIS — E1129 Type 2 diabetes mellitus with other diabetic kidney complication: Secondary | ICD-10-CM | POA: Diagnosis not present

## 2013-07-07 DIAGNOSIS — Z79899 Other long term (current) drug therapy: Secondary | ICD-10-CM | POA: Diagnosis not present

## 2013-07-07 DIAGNOSIS — I10 Essential (primary) hypertension: Secondary | ICD-10-CM | POA: Diagnosis not present

## 2013-07-07 DIAGNOSIS — D649 Anemia, unspecified: Secondary | ICD-10-CM | POA: Diagnosis not present

## 2013-07-07 DIAGNOSIS — N189 Chronic kidney disease, unspecified: Secondary | ICD-10-CM | POA: Diagnosis not present

## 2013-07-07 DIAGNOSIS — R809 Proteinuria, unspecified: Secondary | ICD-10-CM | POA: Diagnosis not present

## 2013-07-07 DIAGNOSIS — E559 Vitamin D deficiency, unspecified: Secondary | ICD-10-CM | POA: Diagnosis not present

## 2013-07-09 DIAGNOSIS — D649 Anemia, unspecified: Secondary | ICD-10-CM | POA: Diagnosis not present

## 2013-07-09 DIAGNOSIS — N184 Chronic kidney disease, stage 4 (severe): Secondary | ICD-10-CM | POA: Diagnosis not present

## 2013-07-09 DIAGNOSIS — I10 Essential (primary) hypertension: Secondary | ICD-10-CM | POA: Diagnosis not present

## 2013-07-13 IMAGING — CT CT HEAD W/O CM
1 series · 16 of 30 positions shown, 20 images · non-contrast
Comparison: None.

CLINICAL DATA: Intractable left sided headache for 1 month,
diabetic

CT HEAD WITHOUT CONTRAST
TECHNIQUE: Contiguous axial images were obtained from the base of
the skull through the vertex without contrast.

[Series 2: headseq 4.8 h37s · axial · 0.43mm/px · z∈[+68,+230]mm · 16 of 36 slices shown, 20 images]
[im 2/36  brain]
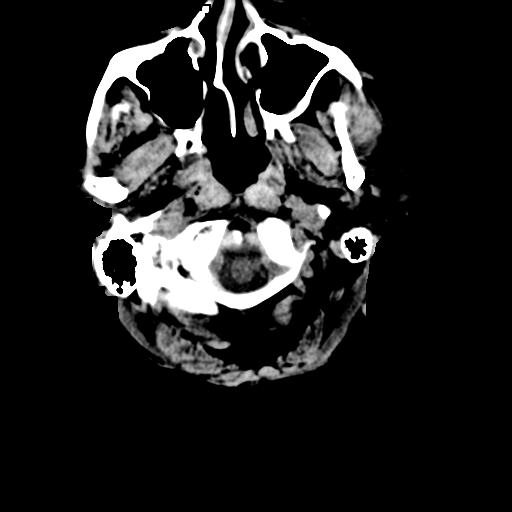
[im 2/36  bone]
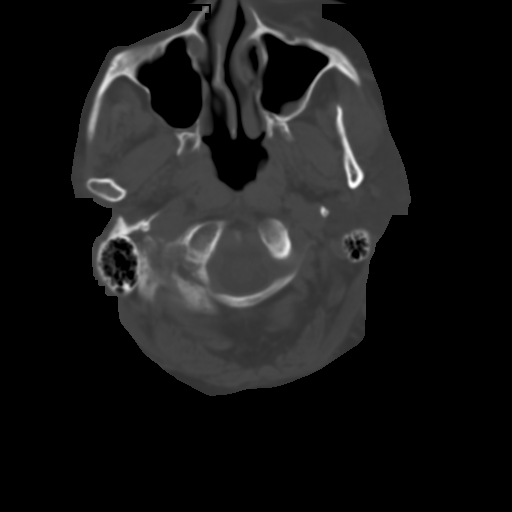
[im 4/36  brain]
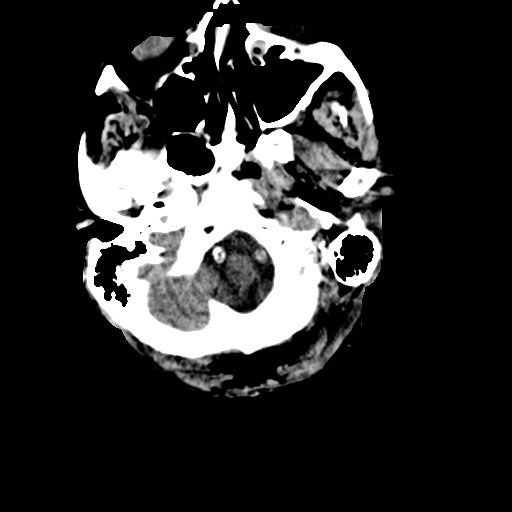
[im 7/36  brain]
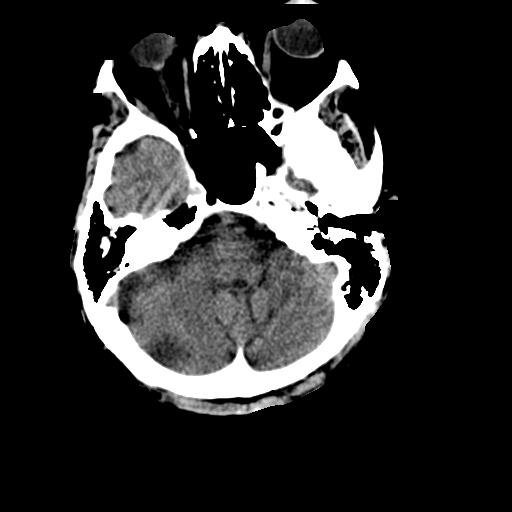
[im 9/36  brain]
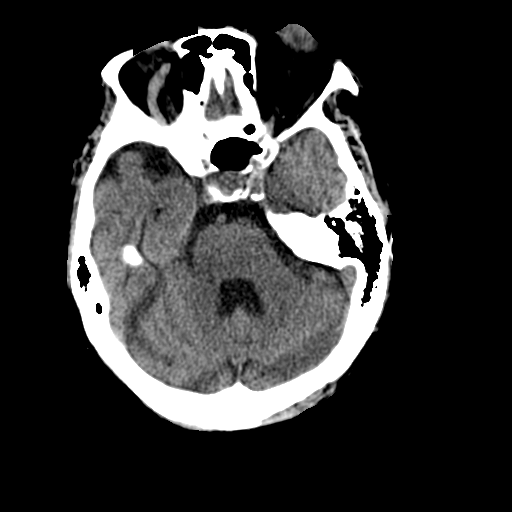
[im 10/36  brain]
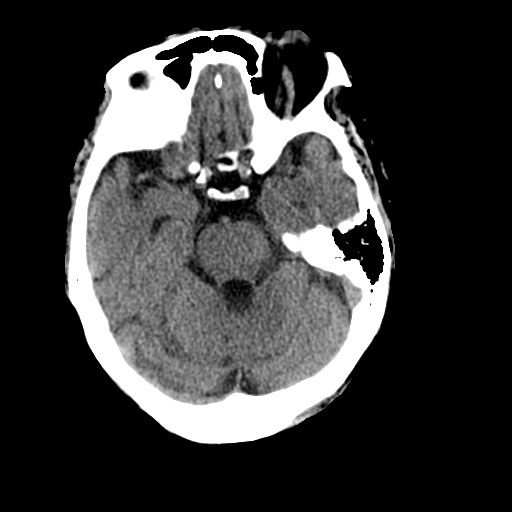
[im 10/36  bone]
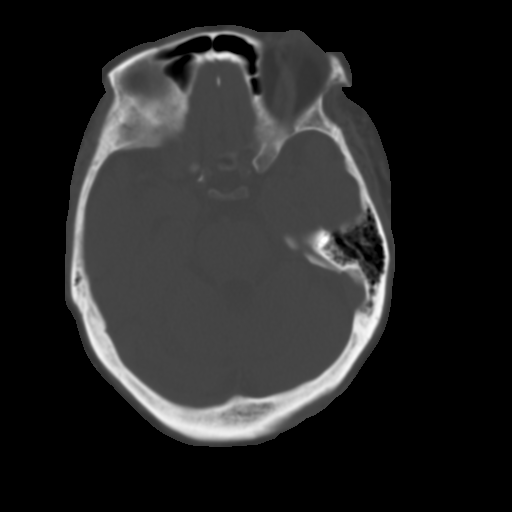
[im 13/36  brain]
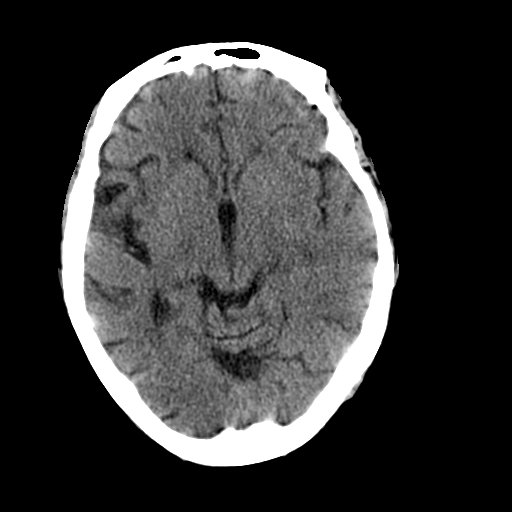
[im 15/36  brain]
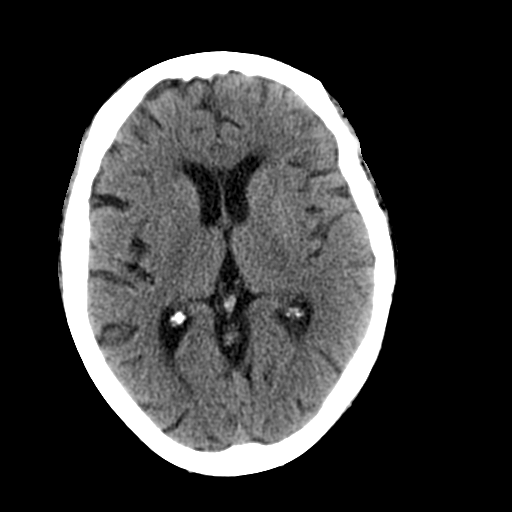
[im 17/36  brain]
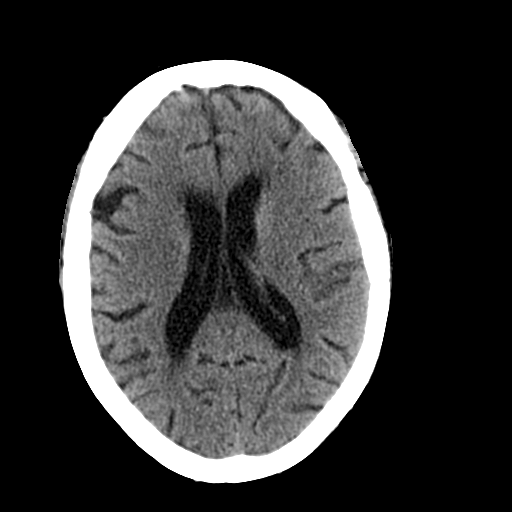
[im 19/36  brain]
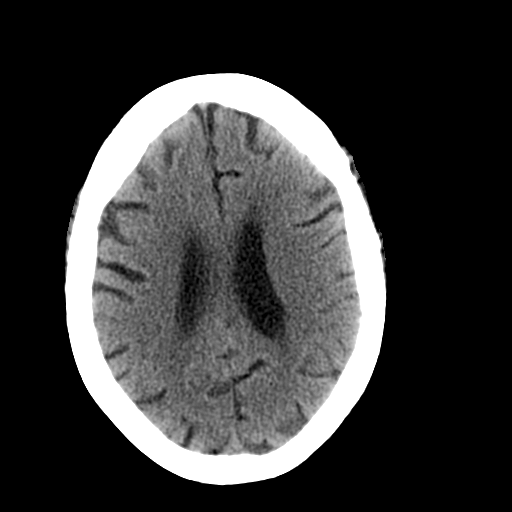
[im 19/36  bone]
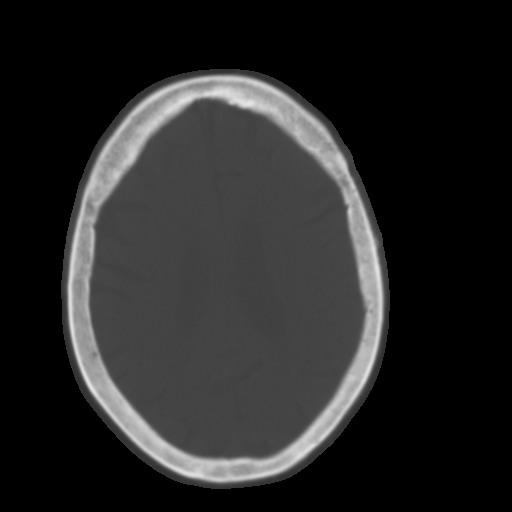
[im 21/36  brain]
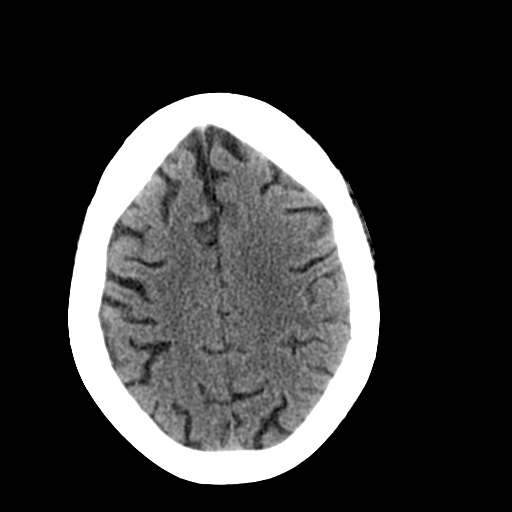
[im 23/36  brain]
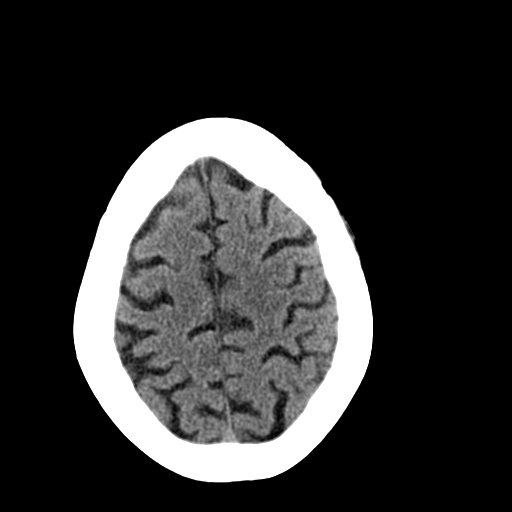
[im 26/36  brain]
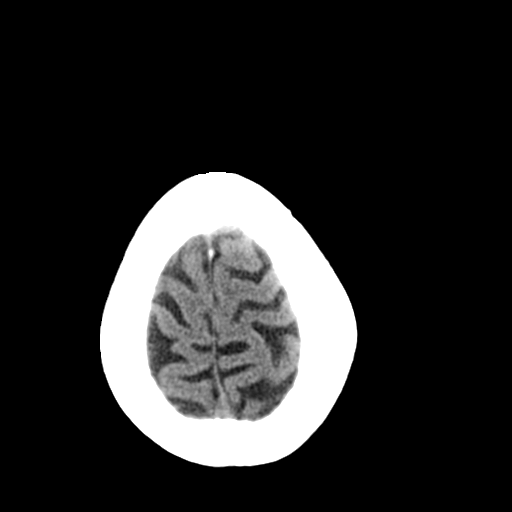
[im 27/36  brain]
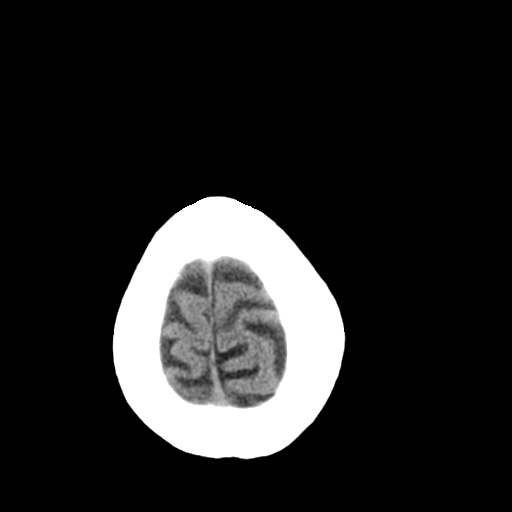
[im 27/36  bone]
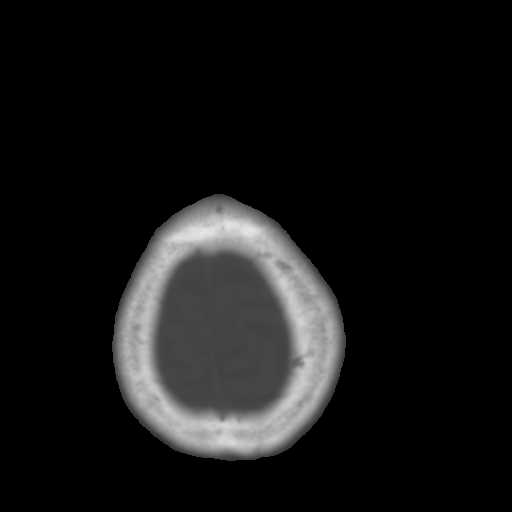
[im 29/36  brain]
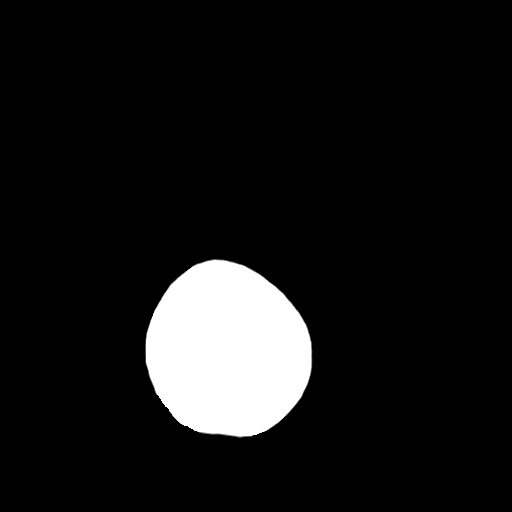
[im 32/36  brain]
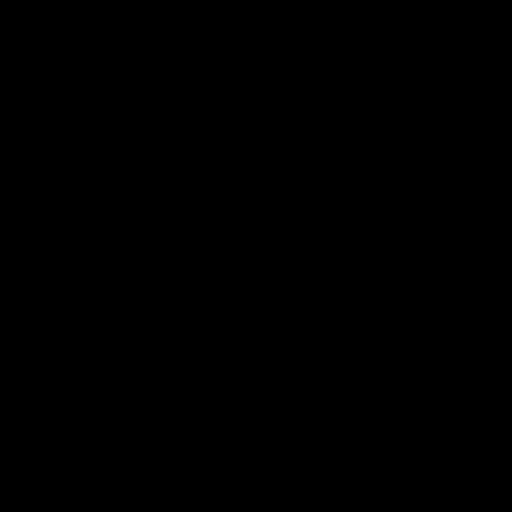
[im 34/36  brain]
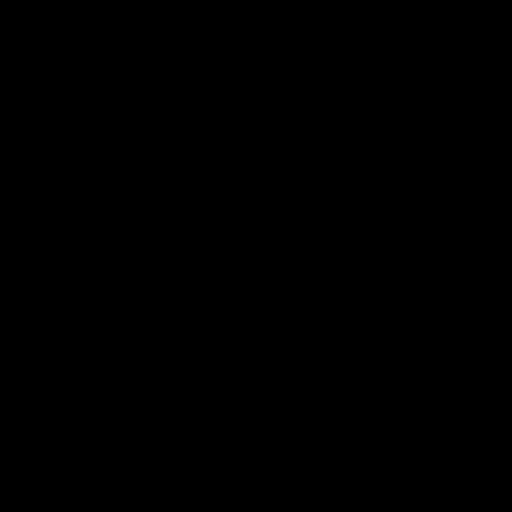

[16 of 30 positions shown; findings below may reference images not displayed]

FINDINGS: The ventricular system is slightly prominent as are the
cortical sulci, indicative of diffuse entry.  The septum is midline
position.  No hemorrhage, mass lesion, or acute infarction is seen.
The previously noted soft tissue lesion in the region of the left
hilar area is stable. Mild mucosal thickening is present in the
posterior left maxillary sinus.
IMPRESSION: Atrophy.  No acute intracranial abnormality.

## 2013-07-14 ENCOUNTER — Ambulatory Visit (INDEPENDENT_AMBULATORY_CARE_PROVIDER_SITE_OTHER): Payer: Medicare Other | Admitting: *Deleted

## 2013-07-14 DIAGNOSIS — Z954 Presence of other heart-valve replacement: Secondary | ICD-10-CM | POA: Diagnosis not present

## 2013-07-14 DIAGNOSIS — Z7901 Long term (current) use of anticoagulants: Secondary | ICD-10-CM | POA: Diagnosis not present

## 2013-07-14 DIAGNOSIS — Z952 Presence of prosthetic heart valve: Secondary | ICD-10-CM

## 2013-07-14 LAB — POCT INR: INR: 2.4

## 2013-07-15 ENCOUNTER — Ambulatory Visit (INDEPENDENT_AMBULATORY_CARE_PROVIDER_SITE_OTHER): Payer: Medicare Other | Admitting: Pharmacist Clinician (PhC)/ Clinical Pharmacy Specialist

## 2013-07-15 ENCOUNTER — Ambulatory Visit (INDEPENDENT_AMBULATORY_CARE_PROVIDER_SITE_OTHER): Payer: Medicare Other | Admitting: Family Medicine

## 2013-07-15 ENCOUNTER — Encounter (INDEPENDENT_AMBULATORY_CARE_PROVIDER_SITE_OTHER): Payer: Self-pay

## 2013-07-15 ENCOUNTER — Encounter: Payer: Self-pay | Admitting: Family Medicine

## 2013-07-15 VITALS — BP 130/62 | HR 98 | Resp 18 | Ht 66.0 in | Wt 228.0 lb

## 2013-07-15 DIAGNOSIS — I10 Essential (primary) hypertension: Secondary | ICD-10-CM | POA: Diagnosis not present

## 2013-07-15 DIAGNOSIS — D242 Benign neoplasm of left breast: Secondary | ICD-10-CM

## 2013-07-15 DIAGNOSIS — N183 Chronic kidney disease, stage 3 unspecified: Secondary | ICD-10-CM

## 2013-07-15 DIAGNOSIS — K219 Gastro-esophageal reflux disease without esophagitis: Secondary | ICD-10-CM

## 2013-07-15 DIAGNOSIS — E039 Hypothyroidism, unspecified: Secondary | ICD-10-CM

## 2013-07-15 DIAGNOSIS — I5022 Chronic systolic (congestive) heart failure: Secondary | ICD-10-CM

## 2013-07-15 DIAGNOSIS — K8020 Calculus of gallbladder without cholecystitis without obstruction: Secondary | ICD-10-CM

## 2013-07-15 DIAGNOSIS — Z23 Encounter for immunization: Secondary | ICD-10-CM | POA: Diagnosis not present

## 2013-07-15 DIAGNOSIS — R7309 Other abnormal glucose: Secondary | ICD-10-CM | POA: Diagnosis not present

## 2013-07-15 DIAGNOSIS — Z952 Presence of prosthetic heart valve: Secondary | ICD-10-CM

## 2013-07-15 DIAGNOSIS — E785 Hyperlipidemia, unspecified: Secondary | ICD-10-CM | POA: Diagnosis not present

## 2013-07-15 DIAGNOSIS — E1129 Type 2 diabetes mellitus with other diabetic kidney complication: Secondary | ICD-10-CM

## 2013-07-15 DIAGNOSIS — J4489 Other specified chronic obstructive pulmonary disease: Secondary | ICD-10-CM

## 2013-07-15 DIAGNOSIS — J449 Chronic obstructive pulmonary disease, unspecified: Secondary | ICD-10-CM

## 2013-07-15 DIAGNOSIS — R7303 Prediabetes: Secondary | ICD-10-CM

## 2013-07-15 DIAGNOSIS — D249 Benign neoplasm of unspecified breast: Secondary | ICD-10-CM

## 2013-07-15 DIAGNOSIS — Z954 Presence of other heart-valve replacement: Secondary | ICD-10-CM

## 2013-07-15 DIAGNOSIS — Z7901 Long term (current) use of anticoagulants: Secondary | ICD-10-CM

## 2013-07-15 MED ORDER — WARFARIN SODIUM 5 MG PO TABS: 5.0000 mg | ORAL_TABLET | Freq: Every day | ORAL | Status: DC

## 2013-07-15 MED ORDER — LEVOTHYROXINE SODIUM 75 MCG PO TABS: 75.0000 ug | ORAL_TABLET | Freq: Every day | ORAL | Status: DC

## 2013-07-15 MED ORDER — ISOSORB DINITRATE-HYDRALAZINE 20-37.5 MG PO TABS: 1.0000 | ORAL_TABLET | Freq: Two times a day (BID) | ORAL | Status: DC

## 2013-07-15 MED ORDER — POTASSIUM CHLORIDE CRYS ER 20 MEQ PO TBCR: 20.0000 meq | EXTENDED_RELEASE_TABLET | Freq: Every day | ORAL | Status: DC

## 2013-07-15 MED ORDER — LANOXIN 125 MCG PO TABS: 0.0625 mg | ORAL_TABLET | Freq: Every day | ORAL | Status: DC

## 2013-07-15 NOTE — Progress Notes (Signed)
  Subjective:    Patient ID: [name] [name], female    DOB: 07-06-1932, 79 y.o.   MRN: [id number]  HPI The PT is here for follow up and re-evaluation of chronic medical conditions, medication management and review of any available recent lab and radiology data.  Preventive health is updated, specifically  Cancer screening and Immunization.   Questions or concerns regarding consultations or procedures which the PT has had in the interim are  addressed. The PT denies any adverse reactions to current medications since the last visit.  There are no new concerns.  There are no specific complaints       Review of Systems See HPI Denies recent fever or chills.Chronic fatigue with poor exercise tolerance is unchanged Denies sinus pressure, nasal congestion, ear pain or sore throat. Denies chest congestion, productive cough or wheezing. Denies chest pains, palpitations and leg swelling Denies abdominal pain, nausea, vomiting,diarrhea or constipation.   Denies dysuria, frequency, hesitancy or incontinence. Chronic back, hip and knee pain and limitation in mobility. Denies headaches, seizures, numbness, or tingling. Denies depression, anxiety or insomnia. Denies skin break down or rash.        Objective:   Physical Exam Patient alert and oriented and in no cardiopulmonary distress.  HEENT: No facial asymmetry, EOMI, no sinus tenderness,  oropharynx pink and moist.  Neck decreased though adequate ROM, no adenopathy.  Chest: Clear to auscultation bilaterally.  CVS: S1, S2 systolic murmur, no S3.  ABD: Soft non tender. Bowel sounds normal.  Ext: No edema  MS: Decreased  ROM spine, shoulders, hips and knees.  Skin: Intact, no ulcerations or rash noted.  Psych: Good eye contact, normal affect. Memory intact not anxious or depressed appearing.  CNS: CN 2-12 intact, power, tone and sensation normal throughout.        Assessment & Plan:

## 2013-07-15 NOTE — Patient Instructions (Signed)
Annual wellness, end January, please call if you need me before  Flu vaccine today  Fasting lipid, chem 7 and HBA1C this week please  Refills as requested.  No clinical suspicion that toe is broken, be careful not to stump again    TSH, hBA1C , fasting lipid, cmp and EGFR end January before visit

## 2013-07-17 ENCOUNTER — Telehealth: Payer: Self-pay | Admitting: *Deleted

## 2013-07-17 ENCOUNTER — Telehealth: Payer: Self-pay | Admitting: Family Medicine

## 2013-07-17 ENCOUNTER — Other Ambulatory Visit: Payer: Self-pay | Admitting: Family Medicine

## 2013-07-17 DIAGNOSIS — E039 Hypothyroidism, unspecified: Secondary | ICD-10-CM | POA: Diagnosis not present

## 2013-07-17 DIAGNOSIS — R7309 Other abnormal glucose: Secondary | ICD-10-CM | POA: Diagnosis not present

## 2013-07-17 DIAGNOSIS — E785 Hyperlipidemia, unspecified: Secondary | ICD-10-CM | POA: Diagnosis not present

## 2013-07-17 LAB — COMPLETE METABOLIC PANEL WITH GFR
ALT: 10 U/L (ref 0–35)
AST: 24 U/L (ref 0–37)
Albumin: 4.1 g/dL (ref 3.5–5.2)
Alkaline Phosphatase: 30 U/L — ABNORMAL LOW (ref 39–117)
BUN: 45 mg/dL — ABNORMAL HIGH (ref 6–23)
CO2: 30 mEq/L (ref 19–32)
Calcium: 9.7 mg/dL (ref 8.4–10.5)
Chloride: 104 mEq/L (ref 96–112)
Creat: 1.97 mg/dL — ABNORMAL HIGH (ref 0.50–1.10)
GFR, Est African American: 27 mL/min — ABNORMAL LOW
GFR, Est Non African American: 24 mL/min — ABNORMAL LOW
Glucose, Bld: 92 mg/dL (ref 70–99)
Potassium: 4.9 mEq/L (ref 3.5–5.3)
Sodium: 138 mEq/L (ref 135–145)
Total Bilirubin: 0.4 mg/dL (ref 0.3–1.2)
Total Protein: 7.4 g/dL (ref 6.0–8.3)

## 2013-07-17 LAB — HEMOGLOBIN A1C
Hgb A1c MFr Bld: 6.4 % — ABNORMAL HIGH (ref <5.7)
Mean Plasma Glucose: 137 mg/dL — ABNORMAL HIGH (ref <117)

## 2013-07-17 LAB — TSH: TSH: 1.476 u[IU]/mL (ref 0.350–4.500)

## 2013-07-17 LAB — LIPID PANEL
Cholesterol: 132 mg/dL (ref 0–200)
HDL: 38 mg/dL — ABNORMAL LOW (ref >39)
LDL Cholesterol: 72 mg/dL (ref 0–99)
Total CHOL/HDL Ratio: 3.5 Ratio
Triglycerides: 110 mg/dL (ref <150)
VLDL: 22 mg/dL (ref 0–40)

## 2013-07-17 MED ORDER — WARFARIN SODIUM 5 MG PO TABS: ORAL_TABLET | ORAL | Status: DC

## 2013-07-17 NOTE — Telephone Encounter (Signed)
Message sent through brother Careers adviser) as to where medicine was sent.

## 2013-07-17 NOTE — Telephone Encounter (Signed)
Return phone call/tgs

## 2013-07-17 NOTE — Telephone Encounter (Signed)
Pt called and states there was a voice mail on her phone with coumadin instructions from [place].  She dosed pt 1 day after I had checked and dosed her.  Told pt to follow my instructions and and keep INR appt as scheduled which is what pt wanted to do as well.

## 2013-07-28 ENCOUNTER — Telehealth: Payer: Self-pay

## 2013-07-28 NOTE — Telephone Encounter (Signed)
Patient stating since she got the flu shot she has been aching all over. Wanted an antibiotic. I advised her with no other symptoms to just take tylenol as needed and call back if any other symptoms develop

## 2013-08-01 ENCOUNTER — Telehealth: Payer: Self-pay | Admitting: Cardiology

## 2013-08-01 NOTE — Telephone Encounter (Signed)
New Problem  Pt has additional questions about Dr. Donneta Romberg requests a call back to discuss before the appointment made

## 2013-08-01 NOTE — Telephone Encounter (Signed)
Returned call to patient no answer.LMTC. 

## 2013-08-04 ENCOUNTER — Telehealth: Payer: Self-pay

## 2013-08-04 ENCOUNTER — Other Ambulatory Visit: Payer: Self-pay | Admitting: Family Medicine

## 2013-08-04 ENCOUNTER — Other Ambulatory Visit: Payer: Self-pay

## 2013-08-04 DIAGNOSIS — M25569 Pain in unspecified knee: Secondary | ICD-10-CM | POA: Diagnosis not present

## 2013-08-04 NOTE — Telephone Encounter (Signed)
I entered a 59.110mcg tablet one daily historically, verify this dose with pharmacist, also there is generic and brand, verify what pt wants before you send  Discuss, let me know if my dose is incorrect please

## 2013-08-04 NOTE — Telephone Encounter (Signed)
Patient states her lanoxin was sent wrong. She had wanted half of the .125mg  because she is unable to break the tablet in half without it crushing. She wants it sent in as 0.0625mg  1 tab daily which I verified they do make. Is this change ok to do? [facility]

## 2013-08-04 NOTE — Telephone Encounter (Signed)
OK to change the way script is written, please do

## 2013-08-05 MED ORDER — DIGOXIN 62.5 MCG PO TABS: 1.0000 | ORAL_TABLET | Freq: Every day | ORAL | Status: DC

## 2013-08-05 NOTE — Telephone Encounter (Signed)
Follow  Up     Pt calling you back please call her back.

## 2013-08-05 NOTE — Telephone Encounter (Signed)
Dose is correct per [facility] pharmacist. Brand name only called in to [facility] per patient for 1 daily

## 2013-08-06 NOTE — Telephone Encounter (Signed)
Returned call to patient she stated she has decided she wants to see cardiologist in [place].Stated she don't drive and would like to stay in [place].Advised to call our [place] and make appointment.

## 2013-08-06 NOTE — Telephone Encounter (Signed)
Follow up     Returned Cheryl's call.

## 2013-08-07 ENCOUNTER — Telehealth: Payer: Self-pay | Admitting: Family Medicine

## 2013-08-07 DIAGNOSIS — E1149 Type 2 diabetes mellitus with other diabetic neurological complication: Secondary | ICD-10-CM | POA: Diagnosis not present

## 2013-08-07 DIAGNOSIS — E119 Type 2 diabetes mellitus without complications: Secondary | ICD-10-CM | POA: Diagnosis not present

## 2013-08-07 MED ORDER — EZETIMIBE-SIMVASTATIN 10-20 MG PO TABS: 1.0000 | ORAL_TABLET | ORAL | Status: DC

## 2013-08-07 NOTE — Telephone Encounter (Signed)
Sent 7 days to [facility] and then a 3 month supply to her mail order

## 2013-08-07 NOTE — Telephone Encounter (Signed)
Called her back to see where she wanted her med sent to. No answer, left message

## 2013-08-11 ENCOUNTER — Ambulatory Visit (INDEPENDENT_AMBULATORY_CARE_PROVIDER_SITE_OTHER): Payer: PRIVATE HEALTH INSURANCE | Admitting: *Deleted

## 2013-08-11 ENCOUNTER — Other Ambulatory Visit: Payer: Self-pay | Admitting: *Deleted

## 2013-08-11 DIAGNOSIS — Z7901 Long term (current) use of anticoagulants: Secondary | ICD-10-CM | POA: Diagnosis not present

## 2013-08-11 DIAGNOSIS — Z952 Presence of prosthetic heart valve: Secondary | ICD-10-CM

## 2013-08-11 DIAGNOSIS — Z954 Presence of other heart-valve replacement: Secondary | ICD-10-CM | POA: Diagnosis not present

## 2013-08-11 LAB — POCT INR: INR: 2.5

## 2013-08-11 MED ORDER — METOLAZONE 2.5 MG PO TABS: 2.5000 mg | ORAL_TABLET | ORAL | Status: DC

## 2013-08-11 NOTE — Telephone Encounter (Signed)
Rx was sent to pharmacy electronically. 

## 2013-08-13 ENCOUNTER — Other Ambulatory Visit: Payer: Self-pay | Admitting: Family Medicine

## 2013-08-14 ENCOUNTER — Other Ambulatory Visit: Payer: Self-pay

## 2013-08-14 MED ORDER — FENOFIBRIC ACID 135 MG PO CPDR: 1.0000 | DELAYED_RELEASE_CAPSULE | Freq: Every day | ORAL | Status: DC

## 2013-08-14 NOTE — Telephone Encounter (Signed)
Rx was sent to pharmacy electronically. 

## 2013-08-25 ENCOUNTER — Encounter: Payer: Self-pay | Admitting: Family Medicine

## 2013-08-25 NOTE — Assessment & Plan Note (Signed)
Maximize medicacal management pt to see nephrologist on at least annual basis

## 2013-08-25 NOTE — Assessment & Plan Note (Signed)
Continue dietary management only

## 2013-08-25 NOTE — Assessment & Plan Note (Signed)
Maintained  symptom free on chronic meds, continue same

## 2013-08-25 NOTE — Assessment & Plan Note (Signed)
Controlled, no change in medication  

## 2013-08-25 NOTE — Assessment & Plan Note (Signed)
Remains relatively symptom free, does have chronic nausea, but high surgical risk, expectant management only

## 2013-08-25 NOTE — Assessment & Plan Note (Signed)
No decompensation currently, continue current meds and cardiology to continue  Manage closely

## 2013-08-25 NOTE — Assessment & Plan Note (Signed)
Mammogram uTD and pt is followed by breast surgeon also

## 2013-08-26 ENCOUNTER — Other Ambulatory Visit: Payer: Self-pay

## 2013-08-26 MED ORDER — MECLIZINE HCL 12.5 MG PO TABS: 12.5000 mg | ORAL_TABLET | Freq: Two times a day (BID) | ORAL | Status: DC | PRN

## 2013-09-01 ENCOUNTER — Encounter (INDEPENDENT_AMBULATORY_CARE_PROVIDER_SITE_OTHER): Payer: Self-pay

## 2013-09-01 ENCOUNTER — Encounter (INDEPENDENT_AMBULATORY_CARE_PROVIDER_SITE_OTHER): Payer: Self-pay | Admitting: Surgery

## 2013-09-01 ENCOUNTER — Ambulatory Visit (INDEPENDENT_AMBULATORY_CARE_PROVIDER_SITE_OTHER): Payer: Medicare Other | Admitting: Surgery

## 2013-09-01 VITALS — BP 106/58 | HR 92 | Temp 98.7°F | Resp 16 | Ht 67.0 in | Wt 230.4 lb

## 2013-09-01 DIAGNOSIS — D242 Benign neoplasm of left breast: Secondary | ICD-10-CM

## 2013-09-01 DIAGNOSIS — D249 Benign neoplasm of unspecified breast: Secondary | ICD-10-CM

## 2013-09-01 NOTE — Patient Instructions (Signed)
Continue annual mammograms and see [place] after your next one.

## 2013-09-01 NOTE — Progress Notes (Signed)
  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 She has multiple medical issues, particularly cardiac so we decided to  not perform surgery but elected to follow her. She notes that she has not noticed a spontaneous discharge since her last visit here. She has noticed no change in her breasts.   ROS: No change  PE General: The patient is alert oriented and healthy-appearing Vital signs: Breasts: The breasts are symmetric in appearance. There not tender. There is no palpable mass on either side. There no visible skin, nipple, or areolar changes noted. I can elicit clear nipple discharge from both breasts. I cannot palpate any subareolar mass on either side. Lymphatics: There is no axillary or supraclavicular adenopathy noted.  Data Reviewed June mammogram: RADIOLOGY REPORT*  Clinical Data: 79 year old female with a history of a probable  papilloma in the subareolar region of the left breast. Surgical  excision was recommended but due to cardiac issues, the patient is  felt not to be a surgical candidate. Followup study.  DIGITAL DIAGNOSTIC BILATERAL MAMMOGRAM WITH CAD AND LEFT BREAST  ULTRASOUND:  Comparison: With priors  Findings:  ACR Breast Density Category 2: There is a scattered fibroglandular  pattern.  No suspicious mass or malignant-type microcalcifications identified  in either breast.  Mammographic images were processed with CAD.  On physical exam, I do not palpate a mass in the left breast.  Ultrasound is performed, showing a stable intraductal soft tissue  mass measuring 5 mm. A feeding stalk to the nodule appears stable.  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  RECOMMENDATION:  The patient is not a surgical candidate therefore yearly follow up  would be suggested.  I have discussed the findings  and recommendations with the patient.  Results were also provided in writing at the conclusion of the  visit. If applicable, a reminder letter will be sent to the  patient regarding the next appointment.  BI-RADS CATEGORY 4: Suspicious abnormality - biopsy should be  considered.  Original Report Authenticated By: Lillia Mountain, M.D.   Assessment 1. Bilateral nipple discharge with likely intraductal papilloma on the left, stable 2. Significant medical issues including significant cardiac risk factors  Plan I think our original plan is best, that is continued followup in annual mammograms with surgery only if there is a change in the character and amount of discharge or change in the very stable lesions in the breast consistent with papillomas.She will come back for a recheck in six months, after the next mammogram

## 2013-09-04 DIAGNOSIS — E559 Vitamin D deficiency, unspecified: Secondary | ICD-10-CM | POA: Diagnosis not present

## 2013-09-04 DIAGNOSIS — I10 Essential (primary) hypertension: Secondary | ICD-10-CM | POA: Diagnosis not present

## 2013-09-04 DIAGNOSIS — D649 Anemia, unspecified: Secondary | ICD-10-CM | POA: Diagnosis not present

## 2013-09-04 DIAGNOSIS — R809 Proteinuria, unspecified: Secondary | ICD-10-CM | POA: Diagnosis not present

## 2013-09-04 DIAGNOSIS — Z79899 Other long term (current) drug therapy: Secondary | ICD-10-CM | POA: Diagnosis not present

## 2013-09-04 DIAGNOSIS — N189 Chronic kidney disease, unspecified: Secondary | ICD-10-CM | POA: Diagnosis not present

## 2013-09-08 ENCOUNTER — Ambulatory Visit (INDEPENDENT_AMBULATORY_CARE_PROVIDER_SITE_OTHER): Payer: Medicare Other | Admitting: *Deleted

## 2013-09-08 DIAGNOSIS — Z954 Presence of other heart-valve replacement: Secondary | ICD-10-CM | POA: Diagnosis not present

## 2013-09-08 DIAGNOSIS — Z952 Presence of prosthetic heart valve: Secondary | ICD-10-CM

## 2013-09-08 DIAGNOSIS — Z7901 Long term (current) use of anticoagulants: Secondary | ICD-10-CM | POA: Diagnosis not present

## 2013-09-08 LAB — POCT INR: INR: 2.5

## 2013-09-10 DIAGNOSIS — N184 Chronic kidney disease, stage 4 (severe): Secondary | ICD-10-CM | POA: Diagnosis not present

## 2013-09-10 DIAGNOSIS — D649 Anemia, unspecified: Secondary | ICD-10-CM | POA: Diagnosis not present

## 2013-09-10 DIAGNOSIS — N2581 Secondary hyperparathyroidism of renal origin: Secondary | ICD-10-CM | POA: Diagnosis not present

## 2013-09-10 DIAGNOSIS — I10 Essential (primary) hypertension: Secondary | ICD-10-CM | POA: Diagnosis not present

## 2013-09-12 ENCOUNTER — Other Ambulatory Visit: Payer: Self-pay | Admitting: *Deleted

## 2013-09-12 NOTE — Telephone Encounter (Signed)
Refill for allopurinol refused - refill not appropriate - defer to PCP

## 2013-09-21 ENCOUNTER — Other Ambulatory Visit: Payer: Self-pay | Admitting: Family Medicine

## 2013-09-23 ENCOUNTER — Other Ambulatory Visit: Payer: Self-pay

## 2013-09-23 MED ORDER — ALBUTEROL SULFATE (2.5 MG/3ML) 0.083% IN NEBU: INHALATION_SOLUTION | RESPIRATORY_TRACT | Status: DC

## 2013-09-24 DIAGNOSIS — J392 Other diseases of pharynx: Secondary | ICD-10-CM | POA: Diagnosis not present

## 2013-09-24 DIAGNOSIS — J069 Acute upper respiratory infection, unspecified: Secondary | ICD-10-CM | POA: Diagnosis not present

## 2013-09-24 DIAGNOSIS — I8310 Varicose veins of unspecified lower extremity with inflammation: Secondary | ICD-10-CM | POA: Diagnosis not present

## 2013-09-29 ENCOUNTER — Encounter (HOSPITAL_COMMUNITY): Payer: Medicare Other | Attending: Internal Medicine

## 2013-09-29 DIAGNOSIS — D509 Iron deficiency anemia, unspecified: Secondary | ICD-10-CM | POA: Diagnosis not present

## 2013-09-29 DIAGNOSIS — T82897A Other specified complication of cardiac prosthetic devices, implants and grafts, initial encounter: Secondary | ICD-10-CM

## 2013-09-29 DIAGNOSIS — D594 Other nonautoimmune hemolytic anemias: Secondary | ICD-10-CM | POA: Diagnosis not present

## 2013-09-29 DIAGNOSIS — E611 Iron deficiency: Secondary | ICD-10-CM

## 2013-09-29 LAB — RETICULOCYTES
RBC.: 3.34 MIL/uL — ABNORMAL LOW (ref 3.87–5.11)
Retic Count, Absolute: 80.2 10*3/uL (ref 19.0–186.0)
Retic Ct Pct: 2.4 % (ref 0.4–3.1)

## 2013-09-29 LAB — CBC WITH DIFFERENTIAL/PLATELET
Basophils Absolute: 0 10*3/uL (ref 0.0–0.1)
Basophils Relative: 0 % (ref 0–1)
Eosinophils Absolute: 0 10*3/uL (ref 0.0–0.7)
Eosinophils Relative: 0 % (ref 0–5)
HCT: 32.2 % — ABNORMAL LOW (ref 36.0–46.0)
Hemoglobin: 10.5 g/dL — ABNORMAL LOW (ref 12.0–15.0)
Lymphocytes Relative: 22 % (ref 12–46)
Lymphs Abs: 1 10*3/uL (ref 0.7–4.0)
MCH: 31.4 pg (ref 26.0–34.0)
MCHC: 32.6 g/dL (ref 30.0–36.0)
MCV: 96.4 fL (ref 78.0–100.0)
Monocytes Absolute: 0.4 10*3/uL (ref 0.1–1.0)
Monocytes Relative: 8 % (ref 3–12)
Neutro Abs: 3.3 10*3/uL (ref 1.7–7.7)
Neutrophils Relative %: 70 % (ref 43–77)
Platelets: 231 10*3/uL (ref 150–400)
RBC: 3.34 MIL/uL — ABNORMAL LOW (ref 3.87–5.11)
RDW: 13.7 % (ref 11.5–15.5)
WBC: 4.8 10*3/uL (ref 4.0–10.5)

## 2013-09-29 LAB — IRON AND TIBC
Iron: 120 ug/dL (ref 42–135)
Saturation Ratios: 32 % (ref 20–55)
TIBC: 377 ug/dL (ref 250–470)
UIBC: 257 ug/dL (ref 125–400)

## 2013-09-29 LAB — COMPREHENSIVE METABOLIC PANEL
ALT: 10 U/L (ref 0–35)
AST: 24 U/L (ref 0–37)
Albumin: 3.9 g/dL (ref 3.5–5.2)
Alkaline Phosphatase: 39 U/L (ref 39–117)
BUN: 50 mg/dL — ABNORMAL HIGH (ref 6–23)
CO2: 29 mEq/L (ref 19–32)
Calcium: 9.6 mg/dL (ref 8.4–10.5)
Chloride: 96 mEq/L (ref 96–112)
Creatinine, Ser: 2 mg/dL — ABNORMAL HIGH (ref 0.50–1.10)
GFR calc Af Amer: 26 mL/min — ABNORMAL LOW (ref >90)
GFR calc non Af Amer: 23 mL/min — ABNORMAL LOW (ref >90)
Glucose, Bld: 108 mg/dL — ABNORMAL HIGH (ref 70–99)
Potassium: 4.6 mEq/L (ref 3.7–5.3)
Sodium: 137 mEq/L (ref 137–147)
Total Bilirubin: 0.3 mg/dL (ref 0.3–1.2)
Total Protein: 8 g/dL (ref 6.0–8.3)

## 2013-09-29 LAB — FERRITIN: Ferritin: 1525 ng/mL — ABNORMAL HIGH (ref 10–291)

## 2013-09-29 LAB — HAPTOGLOBIN: Haptoglobin: <25 mg/dL — ABNORMAL LOW (ref 45–215)

## 2013-09-29 NOTE — Progress Notes (Signed)
Labs drawn today for transferrin receptor,cbc/diff,cmp,ferr,Iron and IBC,retic,haptogloblin

## 2013-09-30 ENCOUNTER — Other Ambulatory Visit: Payer: Self-pay

## 2013-09-30 MED ORDER — FUROSEMIDE 40 MG PO TABS: 40.0000 mg | ORAL_TABLET | Freq: Every day | ORAL | Status: DC

## 2013-10-01 ENCOUNTER — Ambulatory Visit (HOSPITAL_COMMUNITY): Payer: Medicare Other

## 2013-10-01 ENCOUNTER — Encounter (HOSPITAL_BASED_OUTPATIENT_CLINIC_OR_DEPARTMENT_OTHER): Payer: Medicare Other

## 2013-10-01 ENCOUNTER — Encounter (HOSPITAL_COMMUNITY): Payer: Self-pay

## 2013-10-01 VITALS — BP 137/73 | HR 88 | Temp 98.1°F | Resp 20 | Wt 228.0 lb

## 2013-10-01 DIAGNOSIS — Z7901 Long term (current) use of anticoagulants: Secondary | ICD-10-CM

## 2013-10-01 DIAGNOSIS — D594 Other nonautoimmune hemolytic anemias: Secondary | ICD-10-CM

## 2013-10-01 NOTE — Patient Instructions (Signed)
.  [place] Discharge Instructions  RECOMMENDATIONS MADE BY THE CONSULTANT AND ANY TEST RESULTS WILL BE SENT TO YOUR REFERRING PHYSICIAN.  EXAM FINDINGS BY THE PHYSICIAN TODAY AND SIGNS OR SYMPTOMS TO REPORT TO CLINIC OR PRIMARY PHYSICIAN: Exam and findings as discussed by Dr. Barnet Glasgow. Do not need iron this time MEDICATIONS PRESCRIBED:  Coumadin 5 mg daily until you finish antibiotic. Check labs with cardiology as scheduled  INSTRUCTIONS/FOLLOW-UP: 3 months with labs  Thank you for choosing [facility] to provide your oncology and hematology care.  To afford each patient quality time with our providers, please arrive at least 15 minutes before your scheduled appointment time.  With your help, our goal is to use those 15 minutes to complete the necessary work-up to ensure our physicians have the information they need to help with your evaluation and healthcare recommendations.    Effective January 1st, 2014, we ask that you re-schedule your appointment with our physicians should you arrive 10 or more minutes late for your appointment.  We strive to give you quality time with our providers, and arriving late affects you and other patients whose appointments are after yours.    Again, thank you for choosing [facility].  Our hope is that these requests will decrease the amount of time that you wait before being seen by our physicians.       _____________________________________________________________  Should you have questions after your visit to [facility], please contact our office at [phone] between the hours of 8:30 a.m. and 5:00 p.m.  Voicemails left after 4:30 p.m. will not be returned until the following business day.  For prescription refill requests, have your pharmacy contact our office with your prescription refill request.

## 2013-10-01 NOTE — Progress Notes (Signed)
[place] [facility]  OFFICE PROGRESS NOTE  [name] Nakayama, MD [address] [address]  DIAGNOSIS: Microangiopathic hemolytic anemia  Chronic anticoagulation, on coumadin for Mechanical AVR and MVR  Chief Complaint  Patient presents with  . 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  INTERVAL HISTORY: [name] [name] 80 y.o. 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 Last Feraheme infusion was on 03/27/2013.  She went to surgery Center recently and is on antibiotics for sinusitis. No adjustment and warfarin dose was recommended. She has had increasing bruising involving the right upper extremity with mild contact. She denies any PND, orthopnea, palpitations, but does have nocturia x2. Urine is not excessively dark. She denies any melena, hematochezia, epistaxis, or hemoptysis. She also denies any vaginal bleeding. Appetite has been good with no nausea, vomiting, incontinence, skin rash, headache, or seizures. She denies craving ice.  MEDICAL HISTORY: Past Medical History  Diagnosis Date  . DJD (degenerative joint disease) of lumbar spine   . Chronic kidney disease, stage I   . Esophageal reflux   . Other and unspecified hyperlipidemia   . Other primary cardiomyopathies   . Obesity, unspecified   . Unspecified hypothyroidism   . DM (diabetes mellitus)   . Essential hypertension, benign   . 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 Pain in joint, pelvic region and thigh   . Insomnia, unspecified   . Iron deficiency 10/04/2011  . IDA (iron deficiency anemia)     Parenteral iron/[name]  . CHF (congestive heart failure)   . COPD (chronic  obstructive pulmonary disease)   . Heart murmur   . ICD (implantable cardiac defibrillator) in place   . Adenomatous polyp of colon 12/16/2002    Dr. [name]/[facility]  . Hyperlipemia   . Hemolysis 09/24/2012  . 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, not stated as uncontrolled(250.40); HYPERLIPIDEMIA; OBESITY; HYPERTENSION - with Cardiac Abnormalities; Non-ischemic cardiomyopathy with ICD; UNSPECIFIED CARDIOVASCULAR DISEASE; VARICOSE VEINS LOWER EXTREMITIES W/OTH COMPS; GERD; CHOLELITHIASIS; CKD (chronic kidney disease) stage 4, GFR 15-29 ml/min, now improved to CKD stage 3; OTHER SIGN AND SYMPTOM IN BREAST; HIP PAIN, RIGHT; DEGENERATIVE JOINT DISEASE, SPINE; INSOMNIA; NAUSEA; NECK SPASM; HEADACHE; Allergic rhinitis; Low back pain; Iron deficiency; Papilloma of breast; Warfarin-induced coagulopathy; Cardiorenal syndrome with renal failure; S/P AVR (aortic valve replacement); S/P MVR (mitral valve replacement); AICD (automatic cardioverter/defibrillator) present; CKD (chronic kidney disease), stage III - due to DM, HTN and cardiorenal syndrome; Chronic anticoagulation, on coumadin for Mechanical AVR and MVR; Upper leg pain, rt. from use of BSC?  ; COPD (chronic obstructive pulmonary disease); Chronic systolic heart failure; Right thigh pain; H/O adenomatous polyp of colon; High risk medication use; Otalgia of left ear; Routine general medical examination at a health care facility; Hemolysis; Pain in right knee; Dizziness and giddiness; Spinal stenosis of lumbar region; OA (osteoarthritis) of knee; Rotator cuff syndrome of left shoulder; Other malaise and fatigue; and Blepharitis of left eye on her problem list.    ALLERGIES:  is allergic to aspirin; niacin; and penicillins.  MEDICATIONS: has a current medication list which includes the following prescription(s): albuterol, allopurinol,  ester-c, fenofibric acid, fenofibric acid, clindamycin, digoxin, ezetimibe-simvastatin, fluticasone-salmeterol, folic acid, furosemide, gabapentin, hydrocodone-acetaminophen, ipratropium-albuterol, isosorbide-hydralazine, levothyroxine, losartan, meclizine, metolazone, omeprazole, polyethylene glycol powder, potassium chloride sa, promethazine, temazepam, toprol xl, and warfarin.  SURGICAL HISTORY:  Past Surgical History  Procedure Laterality Date  . Thyroidectomy    . Pacemaker placed  2004  . Aortic and mitral valve replacement  2001  . Right breast cyst removed      benign   . Right cataract removed      2005  . Defibrillator implanted 2006    . Cardiac valve replacement    . Cardiac catheterization    . Insert / replace / remove pacemaker    . Esophagogastroduodenoscopy  06/12/2007    Rourk- normal esophagus, small hiatal hernia, otherwise normal stomach, D1, D2  . Colonoscopy  06/12/2007    Rourk- Normal rectum/Normal colon  . Colonoscopy  12/16/02    small hemorrhoids  . Colonoscopy  06/20/2012    Procedure: COLONOSCOPY;  Surgeon: Daneil Dolin, MD;  [facility];  Service: Endoscopy;  Laterality: N/A;  10:45    FAMILY HISTORY: family history includes Cancer in her mother; Diabetes in her brother; Heart disease in her brother, father, and sister.  SOCIAL HISTORY:  reports that she quit smoking about 26 years ago. She does not have any smokeless tobacco history on file. She reports that she does not drink alcohol or use illicit drugs.  REVIEW OF SYSTEMS:  Other than that discussed above is noncontributory.  PHYSICAL EXAMINATION: ECOG PERFORMANCE STATUS: 1 - Symptomatic but completely ambulatory  Blood pressure 137/73, pulse 88, temperature 98.1 F (36.7 C), resp. rate 20, weight 228 lb (103.42 kg).  GENERAL:alert, no distress and comfortable SINUSES: Nontender. SKIN: skin color, texture, turgor are normal, no rashes or significant lesions EYES: PERLA;  Conjunctiva are pink and non-injected, sclera clear OROPHARYNX:no exudate, no erythema on lips, buccal mucosa, or tongue. NECK: supple, thyroid normal size, non-tender, without nodularity. No masses CHEST: Midline sternotomy scar is well-healed. LYMPH:  no palpable lymphadenopathy in the cervical, axillary or inguinal LUNGS: clear to auscultation and percussion with normal breathing effort HEART: regular rate & rhythm . Opening click heard over mitral and aortic area with no diastolic murmur. ABDOMEN:abdomen soft, non-tender and normal bowel sounds MUSCULOSKELETAL:no cyanosis of digits and no clubbing. Range of motion normal. Right upper extremity humeral bruise. NEURO: alert & oriented x 3 with fluent speech, no focal motor/sensory deficits   LABORATORY DATA: Infusion on 09/29/2013  Component Date Value Range Status  . WBC 09/29/2013 4.8  4.0 - 10.5 K/uL Final  . RBC 09/29/2013 3.34* 3.87 - 5.11 MIL/uL Final  . Hemoglobin 09/29/2013 10.5* 12.0 - 15.0 g/dL Final  . HCT 09/29/2013 32.2* 36.0 - 46.0 % Final  . MCV 09/29/2013 96.4  78.0 - 100.0 fL Final  . MCH 09/29/2013 31.4  26.0 - 34.0 pg Final  . MCHC 09/29/2013 32.6  30.0 - 36.0 g/dL Final  . RDW 09/29/2013 13.7  11.5 - 15.5 % Final  . Platelets 09/29/2013 231  150 - 400 K/uL Final  . Neutrophils Relative % 09/29/2013 70  43 - 77 % Final  . Neutro Abs 09/29/2013 3.3  1.7 - 7.7 K/uL Final  . Lymphocytes Relative 09/29/2013 22  12 - 46 % Final  . Lymphs Abs 09/29/2013 1.0  0.7 - 4.0 K/uL Final  . Monocytes Relative 09/29/2013 8  3 - 12 % Final  . Monocytes Absolute 09/29/2013 0.4  0.1 -  1.0 K/uL Final  . Eosinophils Relative 09/29/2013 0  0 - 5 % Final  . Eosinophils Absolute 09/29/2013 0.0  0.0 - 0.7 K/uL Final  . Basophils Relative 09/29/2013 0  0 - 1 % Final  . Basophils Absolute 09/29/2013 0.0  0.0 - 0.1 K/uL Final  . Sodium 09/29/2013 137  137 - 147 mEq/L Final  . Potassium 09/29/2013 4.6  3.7 - 5.3 mEq/L Final  . Chloride  09/29/2013 96  96 - 112 mEq/L Final  . CO2 09/29/2013 29  19 - 32 mEq/L Final  . Glucose, Bld 09/29/2013 108* 70 - 99 mg/dL Final  . BUN 09/29/2013 50* 6 - 23 mg/dL Final  . Creatinine, Ser 09/29/2013 2.00* 0.50 - 1.10 mg/dL Final  . Calcium 09/29/2013 9.6  8.4 - 10.5 mg/dL Final  . Total Protein 09/29/2013 8.0  6.0 - 8.3 g/dL Final  . Albumin 09/29/2013 3.9  3.5 - 5.2 g/dL Final  . AST 09/29/2013 24  0 - 37 U/L Final  . ALT 09/29/2013 10  0 - 35 U/L Final  . Alkaline Phosphatase 09/29/2013 39  39 - 117 U/L Final  . Total Bilirubin 09/29/2013 0.3  0.3 - 1.2 mg/dL Final  . GFR calc non Af Amer 09/29/2013 23* >90 mL/min Final  . GFR calc Af Amer 09/29/2013 26* >90 mL/min Final   Comment: (NOTE)                          The eGFR has been calculated using the CKD EPI equation.                          This calculation has not been validated in all clinical situations.                          eGFR's persistently <90 mL/min signify possible Chronic Kidney                          Disease.  . Ferritin 09/29/2013 1525* 10 - 291 ng/mL Final   Comment: Result repeated and verified.                          Performed at [facility]  . Iron 09/29/2013 120  42 - 135 ug/dL Final  . TIBC 09/29/2013 377  250 - 470 ug/dL Final  . Saturation Ratios 09/29/2013 32  20 - 55 % Final  . UIBC 09/29/2013 257  125 - 400 ug/dL Final   Performed at [facility]  . Retic Ct Pct 09/29/2013 2.4  0.4 - 3.1 % Final  . RBC. 09/29/2013 3.34* 3.87 - 5.11 MIL/uL Final  . Retic Count, Manual 09/29/2013 80.2  19.0 - 186.0 K/uL Final  . Haptoglobin 09/29/2013 <25* 45 - 215 mg/dL Final   Performed at [facility]  Anti-coag visit on 09/08/2013  Component Date Value Range Status  . INR 09/08/2013 2.5   Final    PATHOLOGY: No new pathology. Peripheral smear showed schistocytes.  Urinalysis    Component Value Date/Time   COLORURINE YELLOW 03/27/2012 1201   APPEARANCEUR CLEAR 03/27/2012 1201    LABSPEC 1.018 03/27/2012 1201   PHURINE 5.5 03/27/2012 1201   GLUCOSEU NEGATIVE 03/27/2012 1201   HGBUR NEGATIVE 03/27/2012 1201   HGBUR negative 05/27/2010 1122   BILIRUBINUR  neg 03/11/2013 1443   BILIRUBINUR NEGATIVE 03/27/2012 1201   KETONESUR NEGATIVE 03/27/2012 1201   PROTEINUR NEGATIVE 03/27/2012 1201   UROBILINOGEN 0.2 03/11/2013 1443   UROBILINOGEN 0.2 03/27/2012 1201   NITRITE neg 03/11/2013 1443   NITRITE NEGATIVE 03/27/2012 1201   LEUKOCYTESUR Negative 03/11/2013 1443    RADIOGRAPHIC STUDIES: MM Digital Diagnostic Bilat Status: Final result            Study Result    *RADIOLOGY REPORT*  Clinical Data: 80 year old female with a history of a probable  papilloma in the subareolar region of the left breast. Surgical  excision was recommended but due to cardiac issues, the patient is  felt not to be a surgical candidate. Followup study.  DIGITAL DIAGNOSTIC BILATERAL MAMMOGRAM WITH CAD AND LEFT BREAST  ULTRASOUND:  Comparison: With priors  Findings:  ACR Breast Density Category 2: There is a scattered fibroglandular  pattern.  No suspicious mass or malignant-type microcalcifications identified  in either breast.  Mammographic images were processed with CAD.  On physical exam, I do not palpate a mass in the left breast.  Ultrasound is performed, showing a stable intraductal soft tissue  mass measuring 5 mm. A feeding stalk to the nodule appears stable.  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  RECOMMENDATION:  The patient is not a surgical candidate therefore yearly follow up  would be suggested.  I have discussed the findings and recommendations with the patient.  Results were also provided in writing at the conclusion of the  visit. If applicable, a reminder letter will be sent to the  patient regarding the next appointment.  BI-RADS CATEGORY 4: Suspicious abnormality - biopsy should be  considered.  Original Report Authenticated  By: Lillia [facility], M.D.      ASSESSMENT:  #1. 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 #2. Sinusitis, on treatment.   PLAN:  #1. Patient was told to decrease warfarin 25 mg daily until antibiotics are finished. #2. Followup in 3 months with CBC, ferritin, LDH,  haptoglobin, and soluble transferrin receptor.   All questions were answered. The patient knows to call the clinic with any problems, questions or concerns. We can certainly see the patient much sooner if necessary.   I spent 25 minutes counseling the patient face to face. The total time spent in the appointment was 30 minutes.    Doroteo Bradford, MD 10/01/2013 1:15 PM

## 2013-10-02 NOTE — Progress Notes (Signed)
This encounter was created in error - please disregard.

## 2013-10-08 ENCOUNTER — Other Ambulatory Visit: Payer: Self-pay | Admitting: Internal Medicine

## 2013-10-08 ENCOUNTER — Encounter: Payer: Self-pay | Admitting: Internal Medicine

## 2013-10-08 ENCOUNTER — Ambulatory Visit (INDEPENDENT_AMBULATORY_CARE_PROVIDER_SITE_OTHER): Payer: Medicare Other | Admitting: Internal Medicine

## 2013-10-08 ENCOUNTER — Ambulatory Visit (INDEPENDENT_AMBULATORY_CARE_PROVIDER_SITE_OTHER): Payer: Medicare Other | Admitting: *Deleted

## 2013-10-08 VITALS — BP 129/60 | HR 80 | Ht 67.0 in | Wt 229.0 lb

## 2013-10-08 DIAGNOSIS — I5022 Chronic systolic (congestive) heart failure: Secondary | ICD-10-CM

## 2013-10-08 DIAGNOSIS — Z954 Presence of other heart-valve replacement: Secondary | ICD-10-CM

## 2013-10-08 DIAGNOSIS — Z95810 Presence of automatic (implantable) cardiac defibrillator: Secondary | ICD-10-CM

## 2013-10-08 DIAGNOSIS — I10 Essential (primary) hypertension: Secondary | ICD-10-CM

## 2013-10-08 DIAGNOSIS — Z952 Presence of prosthetic heart valve: Secondary | ICD-10-CM

## 2013-10-08 DIAGNOSIS — Z7901 Long term (current) use of anticoagulants: Secondary | ICD-10-CM | POA: Diagnosis not present

## 2013-10-08 DIAGNOSIS — I428 Other cardiomyopathies: Secondary | ICD-10-CM

## 2013-10-08 LAB — MDC_IDC_ENUM_SESS_TYPE_INCLINIC
Battery Voltage: 2.87 V
Brady Statistic AP VP Percent: 76.86 %
Brady Statistic AP VS Percent: 0.01 %
Brady Statistic AS VP Percent: 23.12 %
Brady Statistic AS VS Percent: 0.01 %
Brady Statistic RA Percent Paced: 76.87 %
Brady Statistic RV Percent Paced: 99.98 %
Date Time Interrogation Session: 20150114144357
HighPow Impedance: 36 Ohm
HighPow Impedance: 43 Ohm
Lead Channel Impedance Value: 380 Ohm
Lead Channel Impedance Value: 437 Ohm
Lead Channel Pacing Threshold Amplitude: 0.5 V
Lead Channel Pacing Threshold Amplitude: 1.5 V
Lead Channel Pacing Threshold Pulse Width: 0.4 ms
Lead Channel Pacing Threshold Pulse Width: 0.6 ms
Lead Channel Sensing Intrinsic Amplitude: 3.25 mV
Lead Channel Setting Pacing Amplitude: 1.5 V
Lead Channel Setting Pacing Amplitude: 3 V
Lead Channel Setting Pacing Pulse Width: 0.6 ms
Lead Channel Setting Sensing Sensitivity: 0.3 mV
Zone Setting Detection Interval: 320 ms
Zone Setting Detection Interval: 350 ms
Zone Setting Detection Interval: 360 ms
Zone Setting Detection Interval: 410 ms

## 2013-10-08 LAB — POCT INR: INR: 2.2

## 2013-10-08 NOTE — Progress Notes (Signed)
HPI [name] [name] is referred today for ongoing evaluation and management of her ICD in the setting of chronic systolic heart failure. The patient is a very pleasant 80 year old woman with a history of chronic systolic heart failure and a nonischemic cardiomyopathy. She has severe left ventricular dysfunction, and is status post ICD implantation. She has not had any recent ICD shocks. She denies peripheral edema. She does admit to a fairly sedentary lifestyle, and has not been exercising. She denies any problems with food intake. She admits to sodium indiscretion. Allergies  Allergen Reactions  . Aspirin     Chronic coumadin  . Niacin Itching  . Penicillins Other (See Comments)    Bruises      Current Outpatient Prescriptions  Medication Sig Dispense Refill  . albuterol (PROVENTIL) (2.5 MG/3ML) 0.083% nebulizer solution INHALE 1 VIAL VIA NEBULIZER THREE TIMES DAILY.  300 mL  4  . allopurinol (ZYLOPRIM) 100 MG tablet take 1 tablet by mouth once daily  90 tablet  0  . Bioflavonoid Products (ESTER-C) 1000-50 MG TABS Take 1 tablet by mouth daily.      . Choline Fenofibrate (FENOFIBRIC ACID) 135 MG CPDR take 1 capsule by mouth at bedtime  30 capsule  6  . Choline Fenofibrate (FENOFIBRIC ACID) 135 MG CPDR Take 1 capsule by mouth at bedtime.  30 capsule  1  . Digoxin 62.5 MCG TABS Take 1 tablet by mouth daily.  90 tablet  3  . ezetimibe-simvastatin (VYTORIN) 10-20 MG per tablet Take 1 tablet by mouth 3 (three) times a week.  36 tablet  1  . Fluticasone-Salmeterol (ADVAIR DISKUS) 100-50 MCG/DOSE AEPB inhale 1 dose by mouth twice a day  60 each  4  . folic acid (FOLVITE) 1 MG tablet take 1 tablet by mouth once daily  100 tablet  6  . furosemide (LASIX) 40 MG tablet Take 1 tablet (40 mg total) by mouth daily.  90 tablet  1  . gabapentin (NEURONTIN) 300 MG capsule TAKE 1 CAPSULE BY MOUTH AT BEDTIME  90 capsule  1  . HYDROcodone-acetaminophen (NORCO/VICODIN) 5-325 MG per tablet Take 1 tablet by  mouth every 6 (six) hours as needed for pain.      Marland Kitchen ipratropium-albuterol (DUONEB) 0.5-2.5 (3) MG/3ML SOLN Take 3 mLs by nebulization 2 (two) times daily as needed. Shortness of Breath      . isosorbide-hydrALAZINE (BIDIL) 20-37.5 MG per tablet Take 1 tablet by mouth 2 (two) times daily.  180 tablet  1  . levothyroxine (SYNTHROID, LEVOTHROID) 75 MCG tablet Take 1 tablet (75 mcg total) by mouth daily.  90 tablet  1  . losartan (COZAAR) 50 MG tablet Take 1 tablet (50 mg total) by mouth daily.  30 tablet  11  . meclizine (ANTIVERT) 12.5 MG tablet Take 1 tablet (12.5 mg total) by mouth 2 (two) times daily as needed for dizziness or nausea.  30 tablet  1  . metolazone (ZAROXOLYN) 2.5 MG tablet Take 1 tablet (2.5 mg total) by mouth once a week. One every 7 days  5 tablet  10  . omeprazole (PRILOSEC) 40 MG capsule take 1 capsule by mouth once daily  30 capsule  1  . polyethylene glycol powder (MIRALAX) powder Take 17 g by mouth as needed.      . potassium chloride SA (K-DUR,KLOR-CON) 20 MEQ tablet Take 1 tablet (20 mEq total) by mouth daily.  90 tablet  1  . promethazine (PHENERGAN) 25 MG tablet take 1  tablet by mouth once daily if needed  30 tablet  1  . temazepam (RESTORIL) 30 MG capsule take 1 capsule at bedtime if needed  30 capsule  2  . TOPROL XL 100 MG 24 hr tablet Take 1 tablet (100 mg total) by mouth daily. Take with or immediately following a meal.  90 tablet  1  . warfarin (COUMADIN) 5 MG tablet Take 7.5 mg by mouth daily.       No current facility-administered medications for this visit.     Past Medical History  Diagnosis Date  . DJD (degenerative joint disease) of lumbar spine   . Chronic kidney disease, stage I   . Esophageal reflux   . Other and unspecified hyperlipidemia   . Other primary cardiomyopathies   . Obesity, unspecified   . Unspecified hypothyroidism   . DM (diabetes mellitus)   . Essential hypertension, benign   . Varicose veins of lower extremities with other  complications   . Pain in joint, pelvic region and thigh   . Insomnia, unspecified   . Iron deficiency 10/04/2011  . IDA (iron deficiency anemia)     Parenteral iron/[name]  . CHF (congestive heart failure)   . COPD (chronic obstructive pulmonary disease)   . Heart murmur   . ICD (implantable cardiac defibrillator) in place   . Adenomatous polyp of colon 12/16/2002    Dr. [name]/[facility]  . Hyperlipemia   . Hemolysis 09/24/2012  . Cancer     ROS:   All systems reviewed and negative except as noted in the HPI.   Past Surgical History  Procedure Laterality Date  . Thyroidectomy    . Pacemaker placed  2004  . Aortic and mitral valve replacement  2001  . Right breast cyst removed      benign   . Right cataract removed      2005  . Defibrillator implanted 2006    . Cardiac valve replacement    . Cardiac catheterization    . Insert / replace / remove pacemaker    . Esophagogastroduodenoscopy  06/12/2007    Rourk- normal esophagus, small hiatal hernia, otherwise normal stomach, D1, D2  . Colonoscopy  06/12/2007    Rourk- Normal rectum/Normal colon  . Colonoscopy  12/16/02    small hemorrhoids  . Colonoscopy  06/20/2012    Procedure: COLONOSCOPY;  Surgeon: Daneil Dolin, MD;  Location: [facility];  Service: Endoscopy;  Laterality: N/A;  10:45     Family History  Problem Relation Age of Onset  . Cancer Mother     behind pancres  . Heart disease Father   . Heart disease Sister     Heart attack  . Heart disease Brother   . Diabetes Brother      History   Social History  . Marital Status: Widowed    Spouse Name: N/A    Number of Children: 0  . Years of Education: N/A   Occupational History  . retired    Social History Main Topics  . Smoking status: Former Smoker    Quit date: 03/08/1987  . Smokeless tobacco: Not on file  . Alcohol Use: No  . Drug Use: No  . Sexual Activity: Not on file   Other Topics Concern  .  Not on file   Social History Narrative   [facility] ([facility])     BP 129/60  Pulse 80  Ht 5\' 7"  (1.702 m)  Wt 229  lb (103.874 kg)  BMI 35.86 kg/m2  Physical Exam:  Well appearing elderly woman, who looks younger than her stated age, and in NAD HEENT: Unremarkable Neck:  7 cm JVD, no thyromegally Back:  No CVA tenderness Lungs:  Clear with no wheezes, rales, or rhonchi. Well-healed ICD incision. HEART:  Regular rate rhythm, no murmurs, no rubs, no clicks, mechanical aortic and mitral valve closure sounds Abd:  soft, positive bowel sounds, no organomegally, no rebound, no guarding Ext:  2 plus pulses, no edema, no cyanosis, no clubbing Skin:  No rashes no nodules Neuro:  CN II through XII intact, motor grossly intact   DEVICE  Normal device function.  See PaceArt for details.   Assess/Plan:

## 2013-10-08 NOTE — Assessment & Plan Note (Signed)
Her chronic systolic heart failure is currently well compensated class II. I am concerned about her lack of physical activity. I've encouraged her increase her physical activity. She will maintain a low-sodium diet and continue her current medications.

## 2013-10-08 NOTE — Patient Instructions (Addendum)
Your physician recommends that you schedule a follow-up appointment in: 12 months with Dr Knox Saliva will receive a reminder letter two months in advance reminding you to call and schedule your appointment. If you don't receive this letter, please contact our office.   Care link transmission on 01-09-14

## 2013-10-08 NOTE — Assessment & Plan Note (Signed)
Her blood pressure is well controlled today. She will continue her current medical therapy.

## 2013-10-08 NOTE — Assessment & Plan Note (Signed)
The patient's ICD is working normally. She has had no ventricular arrhythmias. Device interrogation demonstrates very poor patient activity.

## 2013-10-09 ENCOUNTER — Encounter: Payer: Self-pay | Admitting: Internal Medicine

## 2013-10-09 LAB — MISCELLANEOUS TEST

## 2013-10-14 ENCOUNTER — Telehealth: Payer: Self-pay | Admitting: *Deleted

## 2013-10-14 NOTE — Telephone Encounter (Signed)
PT STATES THAT SHE DOESN'T WAS THE DEVICE CHECKING SYSTEM TO BE SENT TO HER. SHE STATES THAT SHE WILL JUST COME TO HER VISITS AS NEEDED OR GO TO EMERGENCY ROOM WHEN NEEDED/TMJ

## 2013-10-15 NOTE — Telephone Encounter (Signed)
Noted in [place] that the patient does not want [place] visits.  Reminder created for 4/15 with the device clinic in [place].

## 2013-10-16 DIAGNOSIS — E785 Hyperlipidemia, unspecified: Secondary | ICD-10-CM | POA: Diagnosis not present

## 2013-10-16 DIAGNOSIS — E119 Type 2 diabetes mellitus without complications: Secondary | ICD-10-CM | POA: Diagnosis not present

## 2013-10-16 DIAGNOSIS — I10 Essential (primary) hypertension: Secondary | ICD-10-CM | POA: Diagnosis not present

## 2013-10-16 DIAGNOSIS — R7309 Other abnormal glucose: Secondary | ICD-10-CM | POA: Diagnosis not present

## 2013-10-16 DIAGNOSIS — E1149 Type 2 diabetes mellitus with other diabetic neurological complication: Secondary | ICD-10-CM | POA: Diagnosis not present

## 2013-10-16 LAB — HEMOGLOBIN A1C
Hgb A1c MFr Bld: 5.9 % — ABNORMAL HIGH (ref <5.7)
Mean Plasma Glucose: 123 mg/dL — ABNORMAL HIGH (ref <117)

## 2013-10-17 LAB — LIPID PANEL
Cholesterol: 150 mg/dL (ref 0–200)
HDL: 37 mg/dL — ABNORMAL LOW (ref >39)
LDL Cholesterol: 80 mg/dL (ref 0–99)
Total CHOL/HDL Ratio: 4.1 Ratio
Triglycerides: 164 mg/dL — ABNORMAL HIGH (ref <150)
VLDL: 33 mg/dL (ref 0–40)

## 2013-10-17 LAB — BASIC METABOLIC PANEL
BUN: 68 mg/dL — ABNORMAL HIGH (ref 6–23)
CO2: 30 mEq/L (ref 19–32)
Calcium: 9.8 mg/dL (ref 8.4–10.5)
Chloride: 96 mEq/L (ref 96–112)
Creat: 2.42 mg/dL — ABNORMAL HIGH (ref 0.50–1.10)
Glucose, Bld: 100 mg/dL — ABNORMAL HIGH (ref 70–99)
Potassium: 5 mEq/L (ref 3.5–5.3)
Sodium: 136 mEq/L (ref 135–145)

## 2013-10-23 ENCOUNTER — Ambulatory Visit (INDEPENDENT_AMBULATORY_CARE_PROVIDER_SITE_OTHER): Payer: Medicare Other | Admitting: Family Medicine

## 2013-10-23 ENCOUNTER — Encounter: Payer: Self-pay | Admitting: Family Medicine

## 2013-10-23 ENCOUNTER — Other Ambulatory Visit: Payer: Self-pay | Admitting: Family Medicine

## 2013-10-23 VITALS — BP 124/72 | HR 87 | Resp 16 | Ht 66.0 in | Wt 231.1 lb

## 2013-10-23 DIAGNOSIS — Z0000 Encounter for general adult medical examination without abnormal findings: Secondary | ICD-10-CM

## 2013-10-23 DIAGNOSIS — E039 Hypothyroidism, unspecified: Secondary | ICD-10-CM | POA: Diagnosis not present

## 2013-10-23 DIAGNOSIS — E669 Obesity, unspecified: Secondary | ICD-10-CM

## 2013-10-23 MED ORDER — WARFARIN SODIUM 5 MG PO TABS: 7.5000 mg | ORAL_TABLET | Freq: Every day | ORAL | Status: DC

## 2013-10-23 MED ORDER — EZETIMIBE-SIMVASTATIN 10-20 MG PO TABS: 1.0000 | ORAL_TABLET | ORAL | Status: DC

## 2013-10-23 MED ORDER — LEVOTHYROXINE SODIUM 75 MCG PO TABS: 75.0000 ug | ORAL_TABLET | Freq: Every day | ORAL | Status: DC

## 2013-10-23 MED ORDER — TOPROL XL 100 MG PO TB24: 100.0000 mg | ORAL_TABLET | Freq: Every day | ORAL | Status: DC

## 2013-10-23 NOTE — Progress Notes (Signed)
Subjective:    Patient ID: [name] [name], female    DOB: 1932-09-03, 80 y.o.   MRN: [id number]  HPI  Preventive Screening-Counseling & Management   Patient present here today for a Medicare annual wellness visit.   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 No  Over the past two weeks, have you felt little interest or pleasure in doing things? No  Have you lost interest or pleasure in daily life? No  Do you often feel hopeless? No  Do you cry easily over simple problems? No   Activities of Daily Living  In your present state of health, do you have any difficulty performing the following activities?  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 Climbing a flight of stairs?:yes Preparing food and eating?:No Bathing or showering?:No Getting dressed?:No Getting to the toilet?:No Using the toilet?:No Moving around from place to place?: yes, uses assistive device  Fall Risk Assessment In the past year have you fallen or had a near fall?:No, uses assistive device all the time Are you currently taking any medications that make you dizzy?:No, if changes position too quickly, light headed   Hearing Difficulties:  No Do you often ask people to speak up or repeat themselves?:No Do you experience ringing or noises in your ears?:occasionally Do you have difficulty understanding soft or whispered voices?:No  Cognitive Testing  Alert? Yes Normal Appearance?Yes  Oriented to person? Yes Place? Yes  Time? Yes  Displays appropriate judgment?Yes  Can read the correct time from a watch face? yes Are you having problems remembering things?No  Advanced Directives have been discussed with the patient?Yes , full code   List the Names of Other Physician/Practitioners you currently use: Listed    Indicate any recent Medical Services you may have received from other than [facility] providers in the past year (date may be approximate).   Assessment:    Annual Wellness Exam   Plan:    During the course of the visit the patient was educated and counseled about appropriate screening and preventive services including:  A healthy diet is rich in fruit, vegetables and whole grains. Poultry fish, nuts and beans are a healthy choice for protein rather then red meat. A low sodium diet and drinking 64 ounces of water daily is generally recommended. Oils and sweet should be limited. Carbohydrates especially for those who are diabetic or overweight, should be limited to 30-45 gram per meal. It is important to eat on a regular schedule, at least 3 times daily. Snacks should be primarily fruits, vegetables or nuts. It is important that you exercise regularly at least 30 minutes 5 times a week. If you develop chest pain, have severe difficulty breathing, or feel very tired, stop exercising immediately  and seek medical attention  Immunization reviewed and updated. Cancer screening reviewed and updated    Patient Instructions (the written plan) was given to the patient.  Medicare Attestation  I have personally reviewed:  The patient's medical and social history  Their use of alcohol, tobacco or illicit drugs  Their current  medications and supplements  The patient's functional ability including ADLs,fall risks, home safety risks, cognitive, and hearing and visual impairment  Diet and physical activities  Evidence for depression or mood disorders  The patient's weight, height, BMI, and visual acuity have been recorded in the chart. I have made referrals, counseling, and provided education to the patient based on review of the above and I have provided the patient with a written personalized care plan for preventive services.     Review of Systems     Objective:   Physical Exam        Assessment & Plan:

## 2013-10-23 NOTE — Patient Instructions (Signed)
F/u in 4.5 month   Pls rept non fasting chem 7 and TSH least week in February  Drink 2 liters water daily    Reduce fatty foods esp cheese

## 2013-10-26 NOTE — Assessment & Plan Note (Signed)
Annual exam as documented. Counseling done  re healthy lifestyle involving commitment to 150 minutes exercise per week, heart healthy diet, and attaining healthy weight.The importance of adequate sleep also discussed. Regular seat belt use and safe storage  of firearms if patient has them, is also discussed. Changes in health habits are decided on by the patient with goals and time frames  set for achieving them. Immunization and cancer screening needs are specifically addressed at this visit.  

## 2013-10-29 ENCOUNTER — Ambulatory Visit (INDEPENDENT_AMBULATORY_CARE_PROVIDER_SITE_OTHER): Payer: Medicare Other | Admitting: *Deleted

## 2013-10-29 DIAGNOSIS — Z952 Presence of prosthetic heart valve: Secondary | ICD-10-CM

## 2013-10-29 DIAGNOSIS — Z954 Presence of other heart-valve replacement: Secondary | ICD-10-CM

## 2013-10-29 DIAGNOSIS — Z5181 Encounter for therapeutic drug level monitoring: Secondary | ICD-10-CM | POA: Diagnosis not present

## 2013-10-29 DIAGNOSIS — Z7901 Long term (current) use of anticoagulants: Secondary | ICD-10-CM

## 2013-10-29 LAB — POCT INR: INR: 3.4

## 2013-10-30 ENCOUNTER — Telehealth: Payer: Self-pay | Admitting: *Deleted

## 2013-10-30 DIAGNOSIS — M25519 Pain in unspecified shoulder: Secondary | ICD-10-CM | POA: Diagnosis not present

## 2013-10-30 DIAGNOSIS — M25569 Pain in unspecified knee: Secondary | ICD-10-CM | POA: Diagnosis not present

## 2013-10-30 NOTE — Telephone Encounter (Signed)
Pt needs have a tooth pulled and needs to know when to come off coumadin

## 2013-10-30 NOTE — Telephone Encounter (Signed)
Pt needs to have 1 front tooth pulled.  Told pt if she had to come off coumadin she would need to be bridged with Lovenox.  Recommended pt check with dentist to see if he feels comfortable pulling tooth on coumadin.  She will check with him and let me know.

## 2013-11-04 ENCOUNTER — Telehealth: Payer: Self-pay | Admitting: Family Medicine

## 2013-11-04 MED ORDER — ONDANSETRON HCL 4 MG PO TABS: 4.0000 mg | ORAL_TABLET | Freq: Every day | ORAL | Status: DC

## 2013-11-04 NOTE — Telephone Encounter (Signed)
Patient aware and med sent  

## 2013-11-04 NOTE — Telephone Encounter (Signed)
pls send in zofran 4 mg one daily as needed, for nausea  6 month total. Discontinue phenergan due to s/e  Indication is chronic nausea due to cholelithiasis (high surgical risk)  Let pt and pharmacy know , [employer] is sending safety msgs Questions pls ask

## 2013-11-04 NOTE — Telephone Encounter (Signed)
Called patient and left message for them to return call at the office   

## 2013-11-10 ENCOUNTER — Ambulatory Visit (INDEPENDENT_AMBULATORY_CARE_PROVIDER_SITE_OTHER): Payer: Medicare Other | Admitting: *Deleted

## 2013-11-10 DIAGNOSIS — Z7901 Long term (current) use of anticoagulants: Secondary | ICD-10-CM | POA: Diagnosis not present

## 2013-11-10 DIAGNOSIS — Z5181 Encounter for therapeutic drug level monitoring: Secondary | ICD-10-CM

## 2013-11-10 DIAGNOSIS — Z954 Presence of other heart-valve replacement: Secondary | ICD-10-CM | POA: Diagnosis not present

## 2013-11-10 DIAGNOSIS — Z952 Presence of prosthetic heart valve: Secondary | ICD-10-CM

## 2013-11-10 LAB — POCT INR: INR: 3

## 2013-11-11 IMAGING — CR DG KNEE COMPLETE 4+V*R*
4 series · 4 of 4 positions shown · non-contrast
Comparison: None.

CLINICAL DATA: Chronic knee pain

RIGHT KNEE - COMPLETE 4+ VIEW

[view not recorded (1 of 4)]
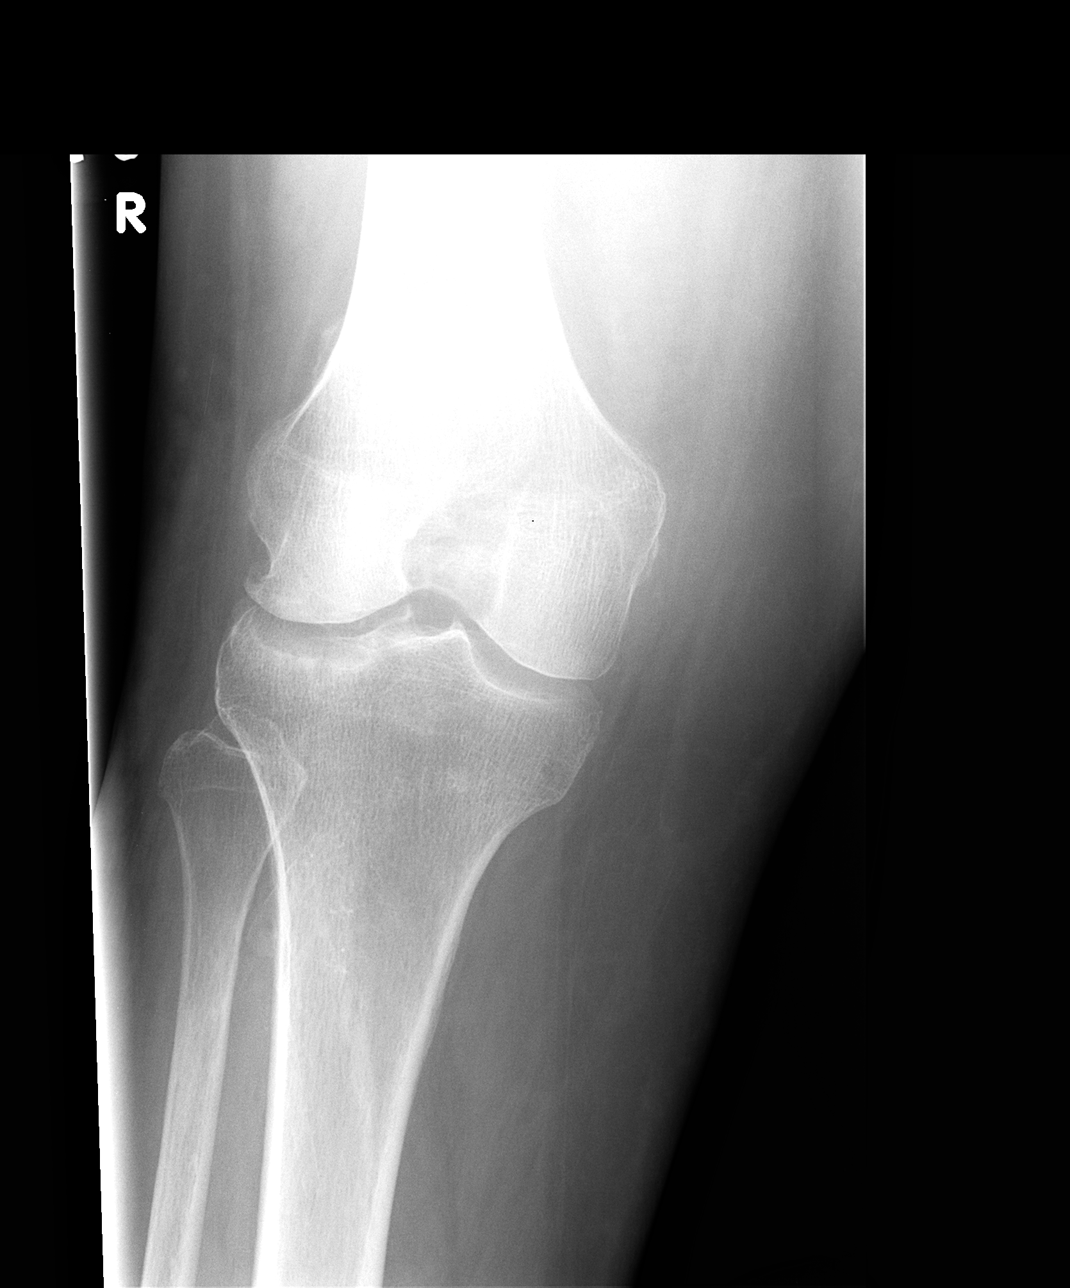

[view not recorded (2 of 4)]
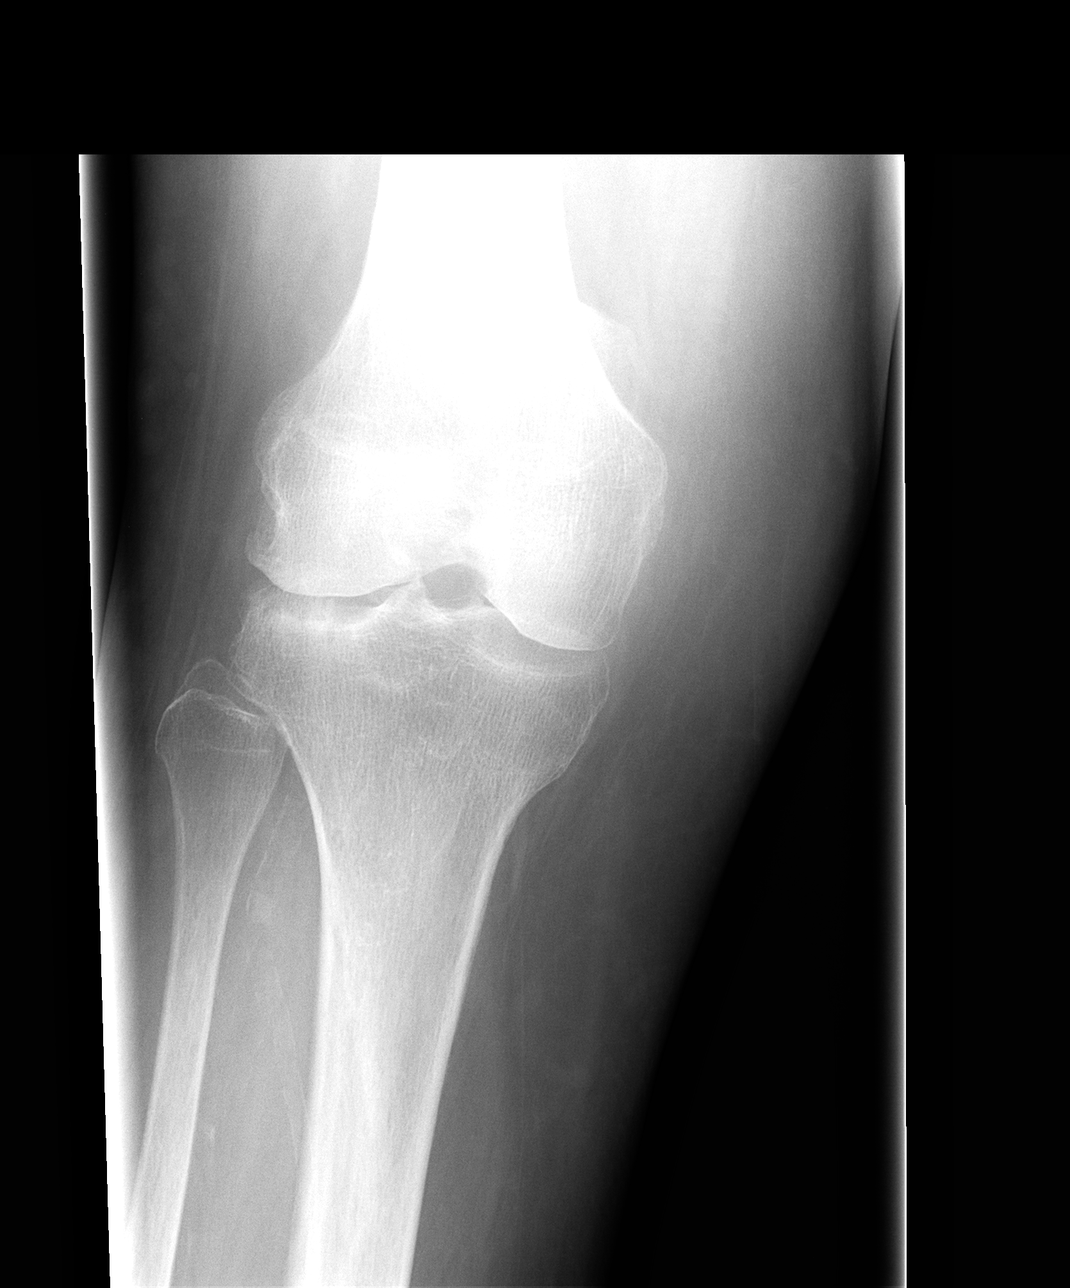

[view not recorded (3 of 4)]
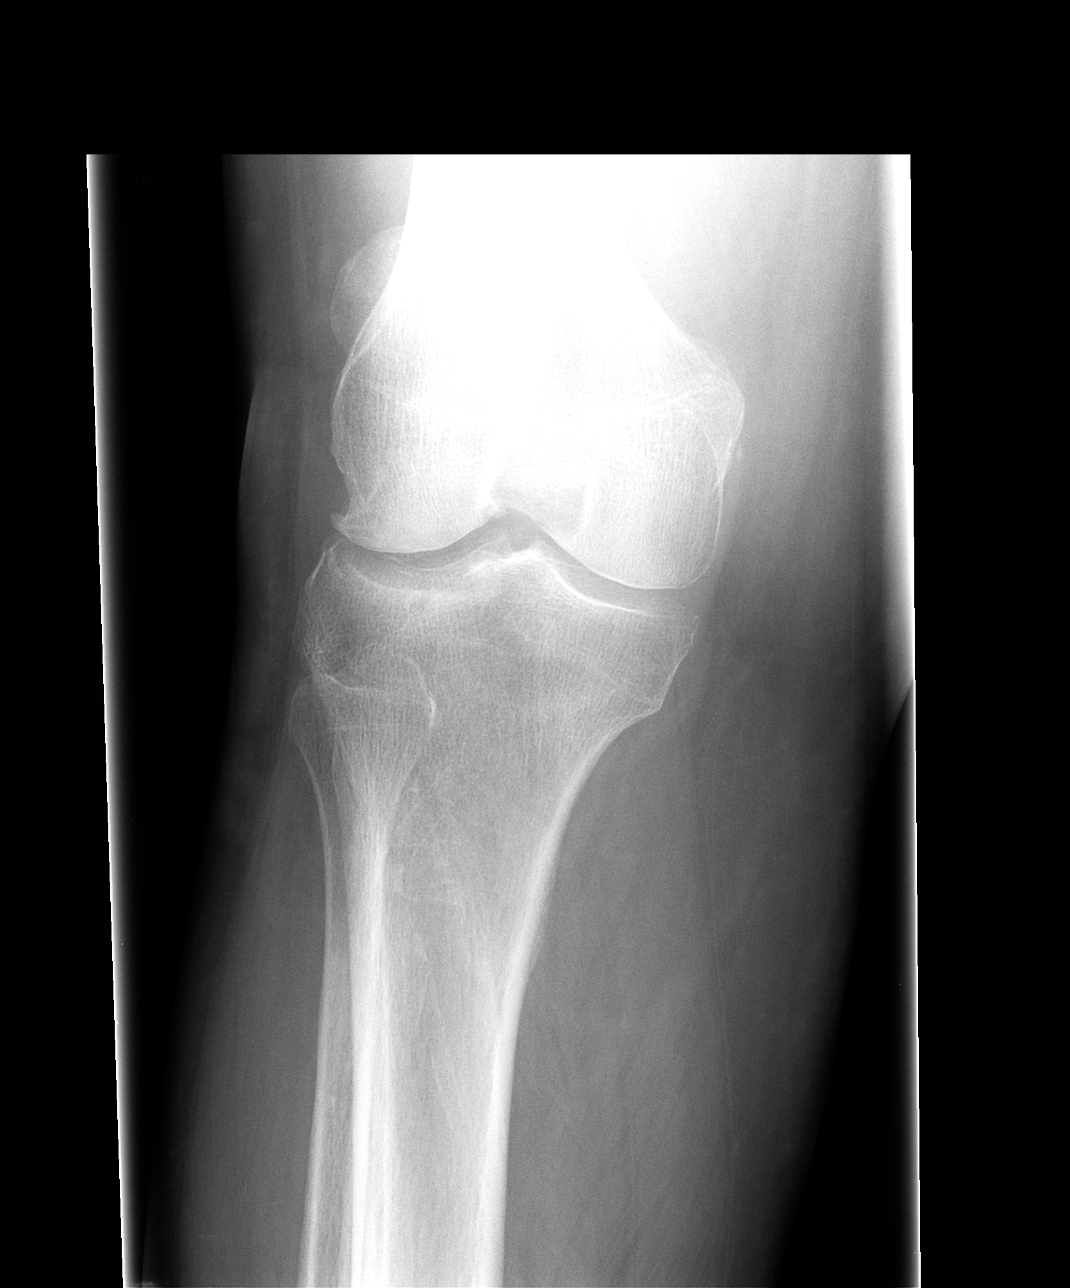

[view not recorded (4 of 4)]
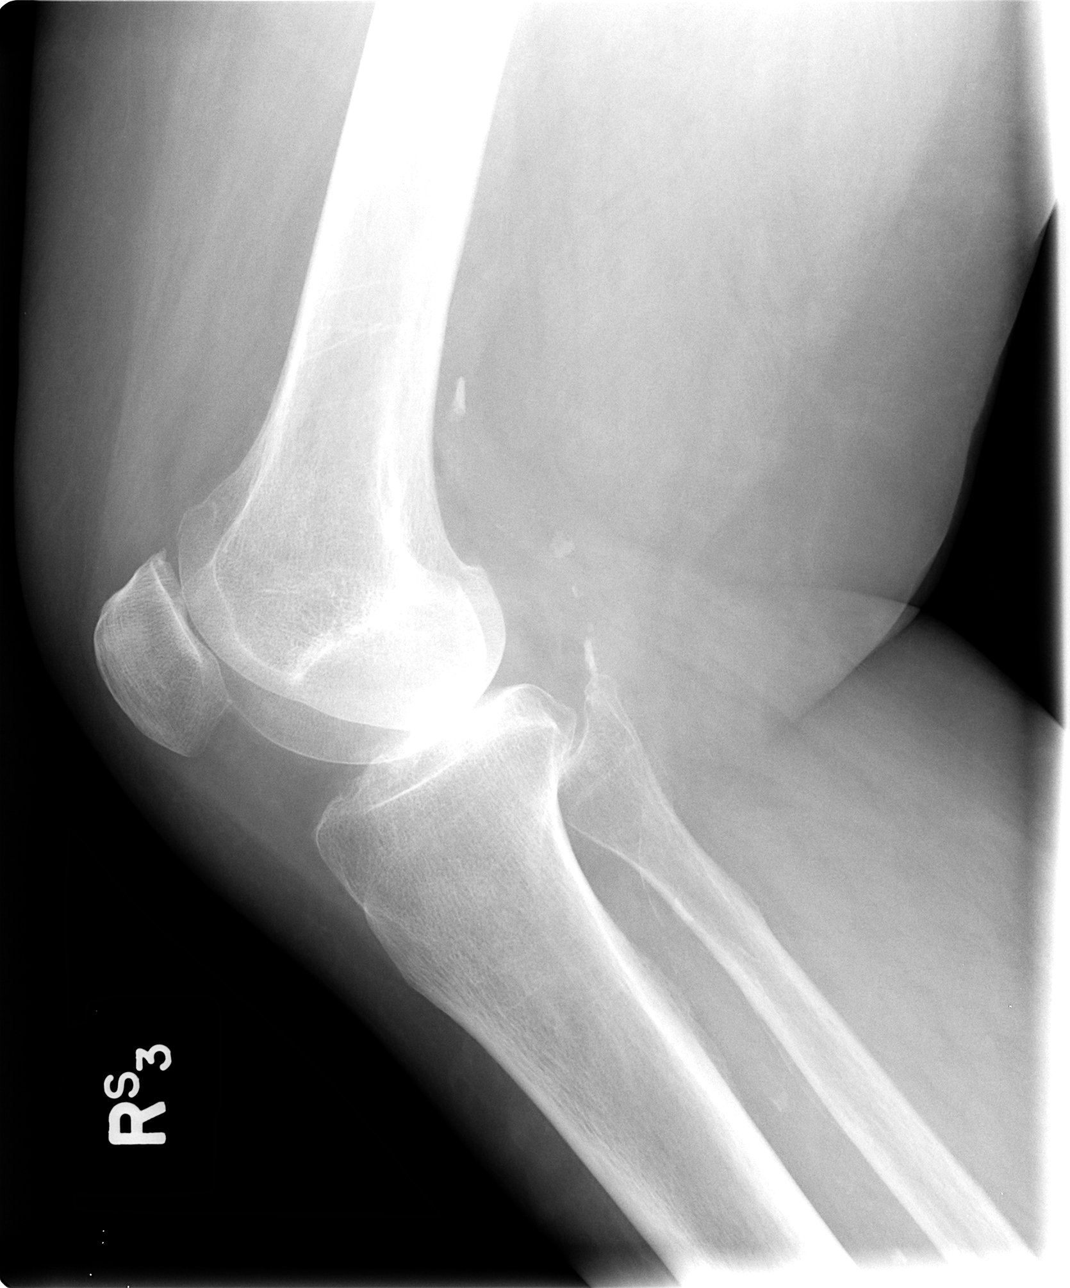

[4 of 4 positions shown; findings below may reference images not displayed]

FINDINGS: Four views of the right knee submitted.  Diffuse mild
narrowing of joint space.  There is mild spurring of the lateral
femoral condyle.  Small joint effusion.  Narrowing of
patellofemoral joint space.  No acute fracture or subluxation.
IMPRESSION: No acute fracture or subluxation.  Mild degenerative changes as
described above.

## 2013-11-15 ENCOUNTER — Telehealth: Payer: Self-pay | Admitting: Family Medicine

## 2013-11-15 DIAGNOSIS — R040 Epistaxis: Secondary | ICD-10-CM

## 2013-11-15 NOTE — Telephone Encounter (Signed)
Unilateral epistaxis 3 days ago, states she was told not due to coumadin when she had it chescked, needs ENT eval since unilaeral , she understands and agrees , I will refer

## 2013-11-18 DIAGNOSIS — E669 Obesity, unspecified: Secondary | ICD-10-CM | POA: Diagnosis not present

## 2013-11-18 DIAGNOSIS — E039 Hypothyroidism, unspecified: Secondary | ICD-10-CM | POA: Diagnosis not present

## 2013-11-18 LAB — BASIC METABOLIC PANEL
BUN: 59 mg/dL — ABNORMAL HIGH (ref 6–23)
CO2: 29 mEq/L (ref 19–32)
Calcium: 9.5 mg/dL (ref 8.4–10.5)
Chloride: 97 mEq/L (ref 96–112)
Creat: 2.19 mg/dL — ABNORMAL HIGH (ref 0.50–1.10)
Glucose, Bld: 104 mg/dL — ABNORMAL HIGH (ref 70–99)
Potassium: 4.1 mEq/L (ref 3.5–5.3)
Sodium: 138 mEq/L (ref 135–145)

## 2013-11-18 LAB — TSH: TSH: 1.41 u[IU]/mL (ref 0.350–4.500)

## 2013-12-01 ENCOUNTER — Telehealth: Payer: Self-pay | Admitting: Family Medicine

## 2013-12-01 ENCOUNTER — Ambulatory Visit (INDEPENDENT_AMBULATORY_CARE_PROVIDER_SITE_OTHER): Payer: Medicare Other | Admitting: *Deleted

## 2013-12-01 DIAGNOSIS — Z5181 Encounter for therapeutic drug level monitoring: Secondary | ICD-10-CM | POA: Diagnosis not present

## 2013-12-01 DIAGNOSIS — Z952 Presence of prosthetic heart valve: Secondary | ICD-10-CM

## 2013-12-01 DIAGNOSIS — Z954 Presence of other heart-valve replacement: Secondary | ICD-10-CM

## 2013-12-01 DIAGNOSIS — Z7901 Long term (current) use of anticoagulants: Secondary | ICD-10-CM

## 2013-12-01 LAB — POCT INR: INR: 3.2

## 2013-12-01 NOTE — Telephone Encounter (Signed)
Pl let her know thyroid function is normal, potassium is normal and kidney function test is a bit better than previous so good result, no med changes

## 2013-12-01 NOTE — Telephone Encounter (Signed)
Creatinine not normal. Any result notes for pt before I call?

## 2013-12-01 NOTE — Telephone Encounter (Signed)
I will enter the result note pls see that , thanks

## 2013-12-02 NOTE — Telephone Encounter (Signed)
Patient aware.

## 2013-12-02 NOTE — Telephone Encounter (Signed)
Noted  

## 2013-12-04 ENCOUNTER — Encounter (HOSPITAL_COMMUNITY): Payer: Self-pay | Admitting: Emergency Medicine

## 2013-12-04 ENCOUNTER — Telehealth: Payer: Self-pay | Admitting: Internal Medicine

## 2013-12-04 ENCOUNTER — Emergency Department (HOSPITAL_COMMUNITY)
Admission: EM | Admit: 2013-12-04 | Discharge: 2013-12-04 | Disposition: A | Discharge status: Home / Self Care | Payer: Medicare Other | Attending: Emergency Medicine | Admitting: Emergency Medicine

## 2013-12-04 ENCOUNTER — Emergency Department (HOSPITAL_COMMUNITY): Payer: Medicare Other

## 2013-12-04 DIAGNOSIS — Z7901 Long term (current) use of anticoagulants: Secondary | ICD-10-CM | POA: Insufficient documentation

## 2013-12-04 DIAGNOSIS — R011 Cardiac murmur, unspecified: Secondary | ICD-10-CM | POA: Diagnosis not present

## 2013-12-04 DIAGNOSIS — Z87891 Personal history of nicotine dependence: Secondary | ICD-10-CM | POA: Insufficient documentation

## 2013-12-04 DIAGNOSIS — I129 Hypertensive chronic kidney disease with stage 1 through stage 4 chronic kidney disease, or unspecified chronic kidney disease: Secondary | ICD-10-CM | POA: Diagnosis not present

## 2013-12-04 DIAGNOSIS — E669 Obesity, unspecified: Secondary | ICD-10-CM | POA: Diagnosis not present

## 2013-12-04 DIAGNOSIS — D509 Iron deficiency anemia, unspecified: Secondary | ICD-10-CM | POA: Insufficient documentation

## 2013-12-04 DIAGNOSIS — E785 Hyperlipidemia, unspecified: Secondary | ICD-10-CM | POA: Insufficient documentation

## 2013-12-04 DIAGNOSIS — Z954 Presence of other heart-valve replacement: Secondary | ICD-10-CM | POA: Insufficient documentation

## 2013-12-04 DIAGNOSIS — Z8739 Personal history of other diseases of the musculoskeletal system and connective tissue: Secondary | ICD-10-CM | POA: Insufficient documentation

## 2013-12-04 DIAGNOSIS — Z860100 Personal history of colon polyps, unspecified: Secondary | ICD-10-CM | POA: Insufficient documentation

## 2013-12-04 DIAGNOSIS — Z950 Presence of cardiac pacemaker: Secondary | ICD-10-CM | POA: Insufficient documentation

## 2013-12-04 DIAGNOSIS — J4489 Other specified chronic obstructive pulmonary disease: Secondary | ICD-10-CM | POA: Diagnosis not present

## 2013-12-04 DIAGNOSIS — G4700 Insomnia, unspecified: Secondary | ICD-10-CM | POA: Diagnosis not present

## 2013-12-04 DIAGNOSIS — I10 Essential (primary) hypertension: Secondary | ICD-10-CM | POA: Diagnosis not present

## 2013-12-04 DIAGNOSIS — J449 Chronic obstructive pulmonary disease, unspecified: Secondary | ICD-10-CM | POA: Diagnosis not present

## 2013-12-04 DIAGNOSIS — K219 Gastro-esophageal reflux disease without esophagitis: Secondary | ICD-10-CM | POA: Insufficient documentation

## 2013-12-04 DIAGNOSIS — I509 Heart failure, unspecified: Secondary | ICD-10-CM | POA: Diagnosis not present

## 2013-12-04 DIAGNOSIS — N181 Chronic kidney disease, stage 1: Secondary | ICD-10-CM | POA: Insufficient documentation

## 2013-12-04 DIAGNOSIS — Z9889 Other specified postprocedural states: Secondary | ICD-10-CM | POA: Insufficient documentation

## 2013-12-04 DIAGNOSIS — R079 Chest pain, unspecified: Secondary | ICD-10-CM | POA: Insufficient documentation

## 2013-12-04 DIAGNOSIS — Z859 Personal history of malignant neoplasm, unspecified: Secondary | ICD-10-CM | POA: Diagnosis not present

## 2013-12-04 DIAGNOSIS — E039 Hypothyroidism, unspecified: Secondary | ICD-10-CM | POA: Insufficient documentation

## 2013-12-04 DIAGNOSIS — Z79899 Other long term (current) drug therapy: Secondary | ICD-10-CM | POA: Diagnosis not present

## 2013-12-04 DIAGNOSIS — E119 Type 2 diabetes mellitus without complications: Secondary | ICD-10-CM | POA: Diagnosis not present

## 2013-12-04 DIAGNOSIS — Z880 Allergy status to penicillin: Secondary | ICD-10-CM | POA: Insufficient documentation

## 2013-12-04 LAB — CBC WITH DIFFERENTIAL/PLATELET
Basophils Absolute: 0 10*3/uL (ref 0.0–0.1)
Basophils Relative: 0 % (ref 0–1)
Eosinophils Absolute: 0 10*3/uL (ref 0.0–0.7)
Eosinophils Relative: 0 % (ref 0–5)
HCT: 30.8 % — ABNORMAL LOW (ref 36.0–46.0)
Hemoglobin: 10 g/dL — ABNORMAL LOW (ref 12.0–15.0)
Lymphocytes Relative: 16 % (ref 12–46)
Lymphs Abs: 1 10*3/uL (ref 0.7–4.0)
MCH: 31.8 pg (ref 26.0–34.0)
MCHC: 32.5 g/dL (ref 30.0–36.0)
MCV: 98.1 fL (ref 78.0–100.0)
Monocytes Absolute: 0.4 10*3/uL (ref 0.1–1.0)
Monocytes Relative: 7 % (ref 3–12)
Neutro Abs: 4.7 10*3/uL (ref 1.7–7.7)
Neutrophils Relative %: 77 % (ref 43–77)
Platelets: 230 10*3/uL (ref 150–400)
RBC: 3.14 MIL/uL — ABNORMAL LOW (ref 3.87–5.11)
RDW: 14.2 % (ref 11.5–15.5)
WBC: 6.1 10*3/uL (ref 4.0–10.5)

## 2013-12-04 LAB — COMPREHENSIVE METABOLIC PANEL
ALT: 11 U/L (ref 0–35)
AST: 27 U/L (ref 0–37)
Albumin: 4.1 g/dL (ref 3.5–5.2)
Alkaline Phosphatase: 34 U/L — ABNORMAL LOW (ref 39–117)
BUN: 50 mg/dL — ABNORMAL HIGH (ref 6–23)
CO2: 34 mEq/L — ABNORMAL HIGH (ref 19–32)
Calcium: 10.2 mg/dL (ref 8.4–10.5)
Chloride: 101 mEq/L (ref 96–112)
Creatinine, Ser: 2.19 mg/dL — ABNORMAL HIGH (ref 0.50–1.10)
GFR calc Af Amer: 23 mL/min — ABNORMAL LOW (ref >90)
GFR calc non Af Amer: 20 mL/min — ABNORMAL LOW (ref >90)
Glucose, Bld: 116 mg/dL — ABNORMAL HIGH (ref 70–99)
Potassium: 4.9 mEq/L (ref 3.7–5.3)
Sodium: 145 mEq/L (ref 137–147)
Total Bilirubin: 0.3 mg/dL (ref 0.3–1.2)
Total Protein: 8.3 g/dL (ref 6.0–8.3)

## 2013-12-04 LAB — TROPONIN I
Troponin I: <0.3 ng/mL (ref <0.30)
Troponin I: <0.3 ng/mL (ref <0.30)

## 2013-12-04 LAB — PRO B NATRIURETIC PEPTIDE: Pro B Natriuretic peptide (BNP): 2217 pg/mL — ABNORMAL HIGH (ref 0–450)

## 2013-12-04 MED ORDER — HYDROCODONE-ACETAMINOPHEN 5-325 MG PO TABS
1.0000 | ORAL_TABLET | Freq: Once | ORAL | Status: AC
  Administered 2013-12-04: 1 via ORAL
  Filled 2013-12-04: qty 1

## 2013-12-04 MED ORDER — FUROSEMIDE 10 MG/ML IJ SOLN
40.0000 mg | Freq: Once | INTRAMUSCULAR | Status: AC
  Administered 2013-12-04: 40 mg via INTRAVENOUS
  Filled 2013-12-04: qty 4

## 2013-12-04 NOTE — ED Notes (Signed)
Pt ate meal tray, ambulating back and forth to the restroom without difficulty.

## 2013-12-04 NOTE — ED Provider Notes (Signed)
CSN: [id number]     Arrival date & time 12/04/13  1436 History  This chart was scribed for Maudry Diego, MD by Jenne Campus, ED Scribe. This patient was seen in room APA04/APA04 and the patient's care was started at 3:19 PM.   Chief Complaint  Patient presents with  . Chest Pain   Patient is a 80 y.o. female presenting with chest pain. The history is provided by the patient. No language interpreter was used.  Chest Pain Pain location:  R chest Pain quality comment:  Stinging Pain radiates to:  Does not radiate Duration:  1 day Timing:  Intermittent Chronicity:  New Context: movement   Associated symptoms: no abdominal pain, no back pain, no cough, no fatigue, no headache, no nausea and not vomiting     HPI Comments: [name] [name] is a 80 y.o. female who presents to the Emergency Department complaining of intermittent right sided CP that started yesterday afternoon. The pain is non-radiating and described as a "quick sting". Movement triggers the pain. Episodes are brief, lasting only seconds. She denies having any pain currently. She has 2 artifical heart valves but denies any prior MI or CAD.   Cards is Dr. Wynonia Lawman PCP is Dr. [name]   Past Medical History  Diagnosis Date  . DJD (degenerative joint disease) of lumbar spine   . Chronic kidney disease, stage I   . Esophageal reflux   . Other and unspecified hyperlipidemia   . Other primary cardiomyopathies   . Obesity, unspecified   . Unspecified hypothyroidism   . DM (diabetes mellitus)   . Essential hypertension, benign   . 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 Pain in joint, pelvic region and thigh   . Insomnia, unspecified   . Iron deficiency 10/04/2011  . IDA (iron deficiency anemia)     Parenteral iron/[name]  . CHF (congestive heart failure)   . COPD (chronic obstructive pulmonary disease)   . Heart murmur   . ICD (implantable cardiac defibrillator) in place   . Adenomatous  polyp of colon 12/16/2002    Dr. [name]/[facility]  . Hyperlipemia   . Hemolysis 09/24/2012  . Cancer    Past Surgical History  Procedure Laterality Date  . Thyroidectomy    . Pacemaker placed  2004  . Aortic and mitral valve replacement  2001  . Right breast cyst removed      benign   . Right cataract removed      2005  . Defibrillator implanted 2006    . Cardiac valve replacement    . Cardiac catheterization    . Insert / replace / remove pacemaker    . Esophagogastroduodenoscopy  06/12/2007    Rourk- normal esophagus, small hiatal hernia, otherwise normal stomach, D1, D2  . Colonoscopy  06/12/2007    Rourk- Normal rectum/Normal colon  . Colonoscopy  12/16/02    small hemorrhoids  . Colonoscopy  06/20/2012    Procedure: COLONOSCOPY;  Surgeon: Daneil Dolin, MD;  Location: [facility];  Service: Endoscopy;  Laterality: N/A;  10:45   Family History  Problem Relation Age of Onset  . Cancer Mother     behind pancres  . Heart disease Father   . Heart disease Sister     Heart attack  . Heart disease Brother   . Diabetes Brother    History  Substance Use Topics  . Smoking status: Former Smoker -- 0.75 packs/day for 40  years    Types: Cigarettes    Quit date: 03/08/1987  . Smokeless tobacco: Never Used  . Alcohol Use: No   No OB history provided.   Review of Systems  Constitutional: Negative for appetite change and fatigue.  HENT: Negative for congestion, ear discharge and sinus pressure.   Eyes: Negative for discharge.  Respiratory: Negative for cough.   Cardiovascular: Positive for chest pain.  Gastrointestinal: Negative for nausea, vomiting, abdominal pain and diarrhea.  Genitourinary: Negative for frequency and hematuria.  Musculoskeletal: Negative for back pain.  Skin: Negative for rash.  Neurological: Negative for seizures and headaches.  Psychiatric/Behavioral: Negative for hallucinations.      Allergies  Aspirin;  Niacin; and Penicillins  Home Medications   Current Outpatient Rx  Name  Route  Sig  Dispense  Refill  . albuterol (PROVENTIL) (2.5 MG/3ML) 0.083% nebulizer solution      INHALE 1 VIAL VIA NEBULIZER THREE TIMES DAILY.   300 mL   4   . allopurinol (ZYLOPRIM) 100 MG tablet      take 1 tablet by mouth once daily   90 tablet   0     Only fill if patient is requesting   . Bioflavonoid Products (ESTER-C) 1000-50 MG TABS   Oral   Take 1 tablet by mouth daily.         . Choline Fenofibrate (FENOFIBRIC ACID) 135 MG CPDR   Oral   Take 1 capsule by mouth at bedtime.   30 capsule   1   . Digoxin 62.5 MCG TABS   Oral   Take 1 tablet by mouth daily.   90 tablet   3   . ezetimibe-simvastatin (VYTORIN) 10-20 MG per tablet   Oral   Take 1 tablet by mouth 3 (three) times a week.   36 tablet   1   . Fluticasone-Salmeterol (ADVAIR DISKUS) 100-50 MCG/DOSE AEPB   Inhalation   Inhale 1 puff into the lungs.   60 each   5   . folic acid (FOLVITE) 1 MG tablet      take 1 tablet by mouth once daily   100 tablet   6   . furosemide (LASIX) 40 MG tablet   Oral   Take 1 tablet (40 mg total) by mouth daily.   90 tablet   1   . gabapentin (NEURONTIN) 300 MG capsule      TAKE 1 CAPSULE BY MOUTH AT BEDTIME   90 capsule   1   . HYDROcodone-acetaminophen (NORCO/VICODIN) 5-325 MG per tablet   Oral   Take 1 tablet by mouth every 6 (six) hours as needed for pain.         Marland Kitchen ipratropium-albuterol (DUONEB) 0.5-2.5 (3) MG/3ML SOLN   Nebulization   Take 3 mLs by nebulization 2 (two) times daily as needed. Shortness of Breath         . isosorbide-hydrALAZINE (BIDIL) 20-37.5 MG per tablet   Oral   Take 1 tablet by mouth 2 (two) times daily.   180 tablet   1   . levothyroxine (SYNTHROID, LEVOTHROID) 75 MCG tablet   Oral   Take 1 tablet (75 mcg total) by mouth daily.   90 tablet   1     Dispense as written.    Patient requests brand name   . losartan (COZAAR) 50 MG  tablet   Oral   Take 1 tablet (50 mg total) by mouth daily.   30 tablet   11   .  meclizine (ANTIVERT) 12.5 MG tablet   Oral   Take 1 tablet (12.5 mg total) by mouth 2 (two) times daily as needed for dizziness or nausea.   30 tablet   1   . metolazone (ZAROXOLYN) 2.5 MG tablet   Oral   Take 1 tablet (2.5 mg total) by mouth once a week. One every 7 days   5 tablet   10   . omeprazole (PRILOSEC) 40 MG capsule      take 1 capsule by mouth once daily   30 capsule   1   . ondansetron (ZOFRAN) 4 MG tablet   Oral   Take 1 tablet (4 mg total) by mouth daily.   90 tablet   1     D/c promethazine   . polyethylene glycol powder (MIRALAX) powder   Oral   Take 17 g by mouth as needed.         . potassium chloride SA (K-DUR,KLOR-CON) 20 MEQ tablet   Oral   Take 1 tablet (20 mEq total) by mouth daily.   90 tablet   1   . temazepam (RESTORIL) 30 MG capsule      take 1 capsule at bedtime if needed   30 capsule   2   . TOPROL XL 100 MG 24 hr tablet   Oral   Take 1 tablet (100 mg total) by mouth daily. Take with or immediately following a meal.   90 tablet   1     Dispense as written.   . warfarin (COUMADIN) 5 MG tablet   Oral   Take 1.5 tablets (7.5 mg total) by mouth daily.   135 tablet   1    Triage Vitals: BP 119/67  Pulse 87  Temp(Src) 98.8 F (37.1 C) (Oral)  Resp 18  Ht 5\' 7"  (1.702 m)  Wt 233 lb 7 oz (105.887 kg)  BMI 36.55 kg/m2  SpO2 92%  Physical Exam  Nursing note and vitals reviewed. Constitutional: She is oriented to person, place, and time. She appears well-developed and well-nourished.  HENT:  Head: Normocephalic and atraumatic.  Eyes: Conjunctivae and EOM are normal. No scleral icterus.  Neck: Neck supple. No thyromegaly present.  Cardiovascular: Normal rate and regular rhythm.  Exam reveals no gallop and no friction rub.   Murmur (aortic ball valve murumur ) heard. Pulmonary/Chest: Effort normal and breath sounds normal. No stridor.  She has no wheezes. She has no rales. She exhibits no tenderness.  Abdominal: Soft. She exhibits no distension. There is no tenderness. There is no rebound.  Musculoskeletal: Normal range of motion. She exhibits no edema.  Lymphadenopathy:    She has no cervical adenopathy.  Neurological: She is alert and oriented to person, place, and time. She exhibits normal muscle tone. Coordination normal.  Skin: Skin is warm and dry. No rash noted. No erythema.  Psychiatric: She has a normal mood and affect. Her behavior is normal.    ED Course  Procedures (including critical care time)  DIAGNOSTIC STUDIES: Oxygen Saturation is 92% on RA, adequate by my interpretation.    COORDINATION OF CARE: 3:22 PM-Discussed treatment plan which includes CXR, CBC panel, CMP and troponin with pt at bedside and pt agreed to plan.   Labs Review Labs Reviewed  CBC WITH DIFFERENTIAL  COMPREHENSIVE METABOLIC PANEL  TROPONIN I   Imaging Review No results found.   EKG Interpretation   Date/Time:  Thursday December 04 2013 14:40:55 EDT Ventricular Rate:  83 PR Interval:  230 QRS Duration: 170 QT Interval:  436 QTC Calculation: 512 R Axis:   103 Text Interpretation:  Atrial-sensed ventricular-paced rhythm with  prolonged AV conduction with Premature atrial complexes with Abberant  conduction Abnormal ECG When compared with ECG of 21-Mar-2012 10:59,  Abberant conduction is now Present Vent. rate has decreased BY  23 BPM  Confirmed by Randye Treichler  MD, Syna Gad [phone]) on 12/04/2013 7:15:21 PM      MDM   Final diagnoses:  None   The chart was scribed for me under my direct supervision.  I personally performed the history, physical, and medical decision making and all procedures in the evaluation of this patient.Maudry Diego, MD 12/04/13 [phone]

## 2013-12-04 NOTE — Telephone Encounter (Signed)
Received message from front desk that she has had CP on and off since last, rated 8/10 with "sweating"  States these sx's are new,directed to go direct to ED

## 2013-12-04 NOTE — ED Notes (Signed)
Pt requesting to take her nightly doses of bydil, lanoxin, and folic acid.  Notified Dr. Roderic Palau, ok per edp for pt to take her own medication.

## 2013-12-04 NOTE — Discharge Instructions (Signed)
Follow up with your md next week for recheck °

## 2013-12-04 NOTE — ED Notes (Signed)
Patient c/o intermittent right side chest pain, non radiating pain. Per patient only "a little" short of breath at times. Denies any nausea, vomiting, dizziness, or weakness.

## 2013-12-04 NOTE — ED Notes (Signed)
Pt reports r sided chest pain off and on since yesterday afternoon.  Reports rubs her chest, belches, then pain goes away.  Denies SOB or n/v.

## 2013-12-04 NOTE — Telephone Encounter (Signed)
Received call from patient with the following:  Chest Pain:  Duration? On and off since last night  Is this new? yes  On pain scale of 1-10, with 10 being the worst, where does your pain rate? 8  Is it associated with shortness of breath or sweating? Sweating but no SOB  Is it constant pain or comes and goes? Comes and goes  Call status: note routed to nurse   -Instruction note:  If patient says pain is greater than 6/10, lasting greater than two days, coming and going and is associated with symptoms, transfer call to nurse.

## 2013-12-11 ENCOUNTER — Ambulatory Visit (INDEPENDENT_AMBULATORY_CARE_PROVIDER_SITE_OTHER): Payer: Medicare Other | Admitting: Otolaryngology

## 2013-12-11 DIAGNOSIS — R040 Epistaxis: Secondary | ICD-10-CM | POA: Diagnosis not present

## 2013-12-11 DIAGNOSIS — J342 Deviated nasal septum: Secondary | ICD-10-CM

## 2013-12-16 DIAGNOSIS — H40009 Preglaucoma, unspecified, unspecified eye: Secondary | ICD-10-CM | POA: Diagnosis not present

## 2013-12-17 ENCOUNTER — Other Ambulatory Visit: Payer: Self-pay

## 2013-12-17 ENCOUNTER — Telehealth: Payer: Self-pay

## 2013-12-17 MED ORDER — FLUTICASONE PROPIONATE 50 MCG/ACT NA SUSP: 2.0000 | Freq: Every day | NASAL | Status: DC

## 2013-12-17 NOTE — Telephone Encounter (Signed)
pls send and advise flonase 2 puffs per nostril daily #1 refill 2

## 2013-12-17 NOTE — Telephone Encounter (Signed)
Patient aware and med sent  

## 2013-12-19 DIAGNOSIS — J210 Acute bronchiolitis due to respiratory syncytial virus: Secondary | ICD-10-CM | POA: Diagnosis not present

## 2013-12-22 ENCOUNTER — Other Ambulatory Visit: Payer: Self-pay | Admitting: Family Medicine

## 2013-12-24 DIAGNOSIS — R809 Proteinuria, unspecified: Secondary | ICD-10-CM | POA: Diagnosis not present

## 2013-12-24 DIAGNOSIS — Z79899 Other long term (current) drug therapy: Secondary | ICD-10-CM | POA: Diagnosis not present

## 2013-12-24 DIAGNOSIS — N189 Chronic kidney disease, unspecified: Secondary | ICD-10-CM | POA: Diagnosis not present

## 2013-12-24 DIAGNOSIS — E559 Vitamin D deficiency, unspecified: Secondary | ICD-10-CM | POA: Diagnosis not present

## 2013-12-24 DIAGNOSIS — D649 Anemia, unspecified: Secondary | ICD-10-CM | POA: Diagnosis not present

## 2013-12-24 DIAGNOSIS — I10 Essential (primary) hypertension: Secondary | ICD-10-CM | POA: Diagnosis not present

## 2013-12-25 DIAGNOSIS — E119 Type 2 diabetes mellitus without complications: Secondary | ICD-10-CM | POA: Diagnosis not present

## 2013-12-25 DIAGNOSIS — E1149 Type 2 diabetes mellitus with other diabetic neurological complication: Secondary | ICD-10-CM | POA: Diagnosis not present

## 2013-12-29 ENCOUNTER — Ambulatory Visit (INDEPENDENT_AMBULATORY_CARE_PROVIDER_SITE_OTHER): Payer: Medicare Other | Admitting: *Deleted

## 2013-12-29 DIAGNOSIS — Z954 Presence of other heart-valve replacement: Secondary | ICD-10-CM | POA: Diagnosis not present

## 2013-12-29 DIAGNOSIS — Z952 Presence of prosthetic heart valve: Secondary | ICD-10-CM

## 2013-12-29 DIAGNOSIS — Z5181 Encounter for therapeutic drug level monitoring: Secondary | ICD-10-CM

## 2013-12-29 DIAGNOSIS — Z7901 Long term (current) use of anticoagulants: Secondary | ICD-10-CM | POA: Diagnosis not present

## 2013-12-29 LAB — POCT INR: INR: 3.6

## 2013-12-30 ENCOUNTER — Encounter: Payer: Self-pay | Admitting: Family Medicine

## 2013-12-30 ENCOUNTER — Ambulatory Visit (INDEPENDENT_AMBULATORY_CARE_PROVIDER_SITE_OTHER): Payer: Medicare Other | Admitting: Family Medicine

## 2013-12-30 ENCOUNTER — Encounter: Payer: Self-pay | Admitting: Internal Medicine

## 2013-12-30 ENCOUNTER — Other Ambulatory Visit (HOSPITAL_COMMUNITY): Payer: PRIVATE HEALTH INSURANCE

## 2013-12-30 VITALS — BP 122/62 | HR 86 | Resp 18 | Ht 66.0 in | Wt 235.0 lb

## 2013-12-30 DIAGNOSIS — R5381 Other malaise: Secondary | ICD-10-CM

## 2013-12-30 DIAGNOSIS — I10 Essential (primary) hypertension: Secondary | ICD-10-CM

## 2013-12-30 DIAGNOSIS — I5022 Chronic systolic (congestive) heart failure: Secondary | ICD-10-CM

## 2013-12-30 DIAGNOSIS — E039 Hypothyroidism, unspecified: Secondary | ICD-10-CM

## 2013-12-30 DIAGNOSIS — J3089 Other allergic rhinitis: Secondary | ICD-10-CM

## 2013-12-30 DIAGNOSIS — R5383 Other fatigue: Secondary | ICD-10-CM

## 2013-12-30 DIAGNOSIS — J42 Unspecified chronic bronchitis: Secondary | ICD-10-CM

## 2013-12-30 DIAGNOSIS — E1129 Type 2 diabetes mellitus with other diabetic kidney complication: Secondary | ICD-10-CM

## 2013-12-30 MED ORDER — BENZONATATE 100 MG PO CAPS: 100.0000 mg | ORAL_CAPSULE | Freq: Three times a day (TID) | ORAL | Status: DC

## 2013-12-30 NOTE — Patient Instructions (Signed)
F/u as before, calll if you worsen, sinusitis and bronchitis have much improved   You will be referred to cardiology for follow up   Start breathing treatments twice daily for next 5 to 7 days  Tessalon perles are prescribed to help with chest congestion.  Remember to weigh daily for heart failure surveillance  Labs for next visit as before

## 2014-01-02 ENCOUNTER — Encounter (HOSPITAL_COMMUNITY): Payer: Medicare Other | Attending: Internal Medicine

## 2014-01-02 ENCOUNTER — Encounter (HOSPITAL_COMMUNITY): Payer: Self-pay

## 2014-01-02 VITALS — BP 114/68 | HR 90 | Temp 98.7°F | Resp 22 | Wt 233.8 lb

## 2014-01-02 DIAGNOSIS — N189 Chronic kidney disease, unspecified: Secondary | ICD-10-CM | POA: Diagnosis not present

## 2014-01-02 DIAGNOSIS — N183 Chronic kidney disease, stage 3 unspecified: Secondary | ICD-10-CM | POA: Diagnosis not present

## 2014-01-02 DIAGNOSIS — J209 Acute bronchitis, unspecified: Secondary | ICD-10-CM | POA: Diagnosis not present

## 2014-01-02 DIAGNOSIS — D631 Anemia in chronic kidney disease: Secondary | ICD-10-CM | POA: Diagnosis not present

## 2014-01-02 DIAGNOSIS — D649 Anemia, unspecified: Secondary | ICD-10-CM | POA: Insufficient documentation

## 2014-01-02 DIAGNOSIS — E611 Iron deficiency: Secondary | ICD-10-CM

## 2014-01-02 DIAGNOSIS — D509 Iron deficiency anemia, unspecified: Secondary | ICD-10-CM | POA: Insufficient documentation

## 2014-01-02 DIAGNOSIS — Z7901 Long term (current) use of anticoagulants: Secondary | ICD-10-CM

## 2014-01-02 DIAGNOSIS — N039 Chronic nephritic syndrome with unspecified morphologic changes: Secondary | ICD-10-CM

## 2014-01-02 DIAGNOSIS — D594 Other nonautoimmune hemolytic anemias: Secondary | ICD-10-CM | POA: Diagnosis not present

## 2014-01-02 LAB — CBC WITH DIFFERENTIAL/PLATELET
Basophils Absolute: 0 10*3/uL (ref 0.0–0.1)
Basophils Relative: 0 % (ref 0–1)
Eosinophils Absolute: 0 10*3/uL (ref 0.0–0.7)
Eosinophils Relative: 0 % (ref 0–5)
HCT: 30.2 % — ABNORMAL LOW (ref 36.0–46.0)
Hemoglobin: 10.1 g/dL — ABNORMAL LOW (ref 12.0–15.0)
Lymphocytes Relative: 15 % (ref 12–46)
Lymphs Abs: 1.3 10*3/uL (ref 0.7–4.0)
MCH: 32.3 pg (ref 26.0–34.0)
MCHC: 33.4 g/dL (ref 30.0–36.0)
MCV: 96.5 fL (ref 78.0–100.0)
Monocytes Absolute: 0.4 10*3/uL (ref 0.1–1.0)
Monocytes Relative: 5 % (ref 3–12)
Neutro Abs: 6.9 10*3/uL (ref 1.7–7.7)
Neutrophils Relative %: 80 % — ABNORMAL HIGH (ref 43–77)
Platelets: 236 10*3/uL (ref 150–400)
RBC: 3.13 MIL/uL — ABNORMAL LOW (ref 3.87–5.11)
RDW: 14.2 % (ref 11.5–15.5)
WBC: 8.6 10*3/uL (ref 4.0–10.5)

## 2014-01-02 LAB — LACTATE DEHYDROGENASE: LDH: 272 U/L — ABNORMAL HIGH (ref 94–250)

## 2014-01-02 MED ORDER — EPOETIN ALFA 20000 UNIT/ML IJ SOLN
INTRAMUSCULAR | Status: AC
  Filled 2014-01-02: qty 1

## 2014-01-02 MED ORDER — EPOETIN ALFA 20000 UNIT/ML IJ SOLN
20000.0000 [IU] | Freq: Once | INTRAMUSCULAR | Status: AC
  Administered 2014-01-02: 20000 [IU] via SUBCUTANEOUS

## 2014-01-02 NOTE — Progress Notes (Signed)
[place]  OFFICE PROGRESS NOTE  [name], MD [address], [address] [place] [address]  DIAGNOSIS: Anemia, perpetuated erythropoietin deficiency - Plan: Erythropoietin, Erythropoietin, CBC with Differential, Reticulocytes  Microangiopathic hemolytic anemia - Plan: CBC with Differential, Lactate dehydrogenase, Ferritin, Transferrin Receptor, Soluable, CBC with Differential, Reticulocytes  CKD (chronic kidney disease), stage III - due to DM, HTN and cardiorenal syndrome  Chronic anticoagulation  Iron deficiency - Plan: epoetin alfa (EPOGEN,PROCRIT) injection 20,000 Units  Chief Complaint  Patient presents with  . Microangiopathic hemolytic anemia    CURRENT THERAPY: Intermittent iron infusion for microangiopathic hemolytic anemia secondary to mechanical mitral and aortic valve replacements.  INTERVAL HISTORY: [name] [name] 80 y.o. female returns for followup of iron deficiency secondary to microangiopathic hemolytic anemia due to mechanical mitral and aortic valve replacements with last ferritin infusion given on 03/27/2013. Last ferritin determination was 1525 performed on 09/29/2013. Patient continues on warfarin. She is currently on treatment for bronchitis. She feels very fatigued. Urine color is normal. She denies any PND, orthopnea, palpitations, and does hear the clicking sounds of her artificial valves. She denies any worsening lower extremity swelling or redness, melena, hematochezia, hematuria, epistaxis, hemoptysis, fever, or night sweats. MEDICAL HISTORY: Past Medical History  Diagnosis Date  . DJD (degenerative joint disease) of lumbar spine   . Chronic kidney disease, stage I   . Esophageal reflux   . Other and unspecified hyperlipidemia   . Other primary cardiomyopathies   . Obesity, unspecified   . Unspecified hypothyroidism   . DM (diabetes mellitus)   . Essential hypertension, benign   . Varicose veins of  lower extremities with other complications   . Pain in joint, pelvic region and thigh   . Insomnia, unspecified   . Iron deficiency 10/04/2011  . IDA (iron deficiency anemia)     Parenteral iron/[name]  . CHF (congestive heart failure)   . COPD (chronic obstructive pulmonary disease)   . Heart murmur   . ICD (implantable cardiac defibrillator) in place   . Adenomatous polyp of colon 12/16/2002    Dr. [name]/[facility]  . Hyperlipemia   . Hemolysis 09/24/2012  . 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, not stated as uncontrolled(250.40); HYPERLIPIDEMIA; OBESITY; HYPERTENSION - with Cardiac Abnormalities; Non-ischemic cardiomyopathy with ICD; VARICOSE VEINS LOWER EXTREMITIES W/OTH COMPS; GERD; CHOLELITHIASIS; HIP PAIN, RIGHT; DEGENERATIVE JOINT DISEASE, SPINE; NAUSEA; Allergic rhinitis; Low back pain; Iron deficiency; Papilloma of breast; Warfarin-induced coagulopathy; Cardiorenal syndrome with renal failure; S/P AVR (aortic valve replacement); S/P MVR (mitral valve replacement); AICD (automatic cardioverter/defibrillator) present; CKD (chronic kidney disease), stage III - due to DM, HTN and cardiorenal syndrome; Chronic anticoagulation, on coumadin for Mechanical AVR and MVR; Upper leg pain, rt. from use of BSC?  ; COPD (chronic obstructive pulmonary disease); Chronic systolic heart failure; H/O adenomatous polyp of colon; High risk medication use; Routine general medical examination at a health care facility; Hemolysis; Spinal stenosis of lumbar region; OA (osteoarthritis) of knee; Rotator cuff syndrome of left shoulder; Other malaise and fatigue; and Encounter for therapeutic drug monitoring on her problem list.    ALLERGIES:  is allergic to aspirin; niacin; and penicillins.  MEDICATIONS: has a current medication list which includes the following prescription(s): acetaminophen,  albuterol, allopurinol, benzonatate, digoxin, ezetimibe-simvastatin, fluticasone, fluticasone-salmeterol, folic acid, furosemide, gabapentin, hydrocodone-acetaminophen, isosorbide-hydralazine, levothyroxine, losartan, metolazone, metoprolol succinate,  ondansetron, polyethylene glycol powder, potassium chloride sa, warfarin, ester-c, and centrum silver adult 50+.  SURGICAL HISTORY:  Past Surgical History  Procedure Laterality Date  . Thyroidectomy    . Pacemaker placed  2004  . Aortic and mitral valve replacement  2001  . Right breast cyst removed      benign   . Right cataract removed      2005  . Defibrillator implanted 2006    . Cardiac valve replacement    . Cardiac catheterization    . Insert / replace / remove pacemaker    . Esophagogastroduodenoscopy  06/12/2007    Rourk- normal esophagus, small hiatal hernia, otherwise normal stomach, D1, D2  . Colonoscopy  06/12/2007    Rourk- Normal rectum/Normal colon  . Colonoscopy  12/16/02    small hemorrhoids  . Colonoscopy  06/20/2012    Procedure: COLONOSCOPY;  Surgeon: Daneil Dolin, MD;  Location: [facility];  Service: Endoscopy;  Laterality: N/A;  10:45    FAMILY HISTORY: family history includes Cancer in her mother; Diabetes in her brother; Heart disease in her brother, father, and sister.  SOCIAL HISTORY:  reports that she quit smoking about 26 years ago. Her smoking use included Cigarettes. She has a 30 pack-year smoking history. She has never used smokeless tobacco. She reports that she does not drink alcohol or use illicit drugs.  REVIEW OF SYSTEMS:  Other than that discussed above is noncontributory.  PHYSICAL EXAMINATION: ECOG PERFORMANCE STATUS: 2 - Symptomatic, <50% confined to bed  Blood pressure 114/68, pulse 90, temperature 98.7 F (37.1 C), temperature source Oral, resp. rate 22, weight 233 lb 12.8 oz (106.051 kg), SpO2 96.00%.  GENERAL:alert, no distress and comfortable. Morbidly obese SKIN: skin color,  texture, turgor are normal, no rashes or significant lesions EYES: PERLA; Conjunctiva are pink and non-injected, sclera clear SINUSES: No redness or tenderness over maxillary or ethmoid sinuses OROPHARYNX:no exudate, no erythema on lips, buccal mucosa, or tongue. NECK: supple, thyroid normal size, non-tender, without nodularity. No masses CHEST: Midline sternotomy scar is well-healed. No breast masses. LYMPH:  no palpable lymphadenopathy in the cervical, axillary or inguinal LUNGS: clear to auscultation and percussion with normal breathing effort HEART: regular rate & rhythm and no murmurs..Artificial valve clicks are heard with no diastolic murmurs. ABDOMEN:abdomen soft, non-tender and normal bowel sounds MUSCULOSKELETAL:no cyanosis of digits and no clubbing. Range of motion normal. +1 lower extremity edema. NEURO: alert & oriented x 3 with fluent speech, no focal motor/sensory deficits   LABORATORY DATA:  Results for [name] [name], [name] [name] (MRN [id number]) as of 01/02/2014 13:16  Ref. Range 03/24/2013 09:33 06/27/2013 09:34 09/29/2013 09:47  Retic Ct Pct Latest Range: 0.4-3.1 % 2.7 2.6 2.4      Results for [name] [name], [name] [name] (MRN [id number]) as of 01/02/2014 13:16  Ref. Range 04/07/2013 15:40 06/12/2013 17:25 06/27/2013 09:34 09/29/2013 09:47 12/04/2013 15:09  Hemoglobin Latest Range: 12.0-15.0 g/dL 10.0 (L) 10.2 (L) 11.2 (L) 10.5 (L) 10.0 (L)     Office Visit on 01/02/2014  Component Date Value Ref Range Status  . WBC 01/02/2014 8.6  4.0 - 10.5 K/uL Final  . RBC 01/02/2014 3.13* 3.87 - 5.11 MIL/uL Final  . Hemoglobin 01/02/2014 10.1* 12.0 - 15.0 g/dL Final  . HCT 01/02/2014 30.2* 36.0 - 46.0 % Final  . MCV 01/02/2014 96.5  78.0 - 100.0 fL Final  . MCH 01/02/2014 32.3  26.0 - 34.0 pg Final  . MCHC 01/02/2014 33.4  30.0 - 36.0 g/dL Final  . RDW 01/02/2014  14.2  11.5 - 15.5 % Final  . Platelets 01/02/2014 236  150 - 400 K/uL Final  . Neutrophils Relative % 01/02/2014 80* 43 - 77 % Final  . Neutro  Abs 01/02/2014 6.9  1.7 - 7.7 K/uL Final  . Lymphocytes Relative 01/02/2014 15  12 - 46 % Final  . Lymphs Abs 01/02/2014 1.3  0.7 - 4.0 K/uL Final  . Monocytes Relative 01/02/2014 5  3 - 12 % Final  . Monocytes Absolute 01/02/2014 0.4  0.1 - 1.0 K/uL Final  . Eosinophils Relative 01/02/2014 0  0 - 5 % Final  . Eosinophils Absolute 01/02/2014 0.0  0.0 - 0.7 K/uL Final  . Basophils Relative 01/02/2014 0  0 - 1 % Final  . Basophils Absolute 01/02/2014 0.0  0.0 - 0.1 K/uL Final  . LDH 01/02/2014 272* 94 - 250 U/L Final  Anti-coag visit on 12/29/2013  Component Date Value Ref Range Status  . INR 12/29/2013 3.6   Final  Admission on 12/04/2013, Discharged on 12/04/2013  Component Date Value Ref Range Status  . WBC 12/04/2013 6.1  4.0 - 10.5 K/uL Final  . RBC 12/04/2013 3.14* 3.87 - 5.11 MIL/uL Final  . Hemoglobin 12/04/2013 10.0* 12.0 - 15.0 g/dL Final  . HCT 12/04/2013 30.8* 36.0 - 46.0 % Final  . MCV 12/04/2013 98.1  78.0 - 100.0 fL Final  . MCH 12/04/2013 31.8  26.0 - 34.0 pg Final  . MCHC 12/04/2013 32.5  30.0 - 36.0 g/dL Final  . RDW 12/04/2013 14.2  11.5 - 15.5 % Final  . Platelets 12/04/2013 230  150 - 400 K/uL Final  . Neutrophils Relative % 12/04/2013 77  43 - 77 % Final  . Neutro Abs 12/04/2013 4.7  1.7 - 7.7 K/uL Final  . Lymphocytes Relative 12/04/2013 16  12 - 46 % Final  . Lymphs Abs 12/04/2013 1.0  0.7 - 4.0 K/uL Final  . Monocytes Relative 12/04/2013 7  3 - 12 % Final  . Monocytes Absolute 12/04/2013 0.4  0.1 - 1.0 K/uL Final  . Eosinophils Relative 12/04/2013 0  0 - 5 % Final  . Eosinophils Absolute 12/04/2013 0.0  0.0 - 0.7 K/uL Final  . Basophils Relative 12/04/2013 0  0 - 1 % Final  . Basophils Absolute 12/04/2013 0.0  0.0 - 0.1 K/uL Final  . Sodium 12/04/2013 145  137 - 147 mEq/L Final  . Potassium 12/04/2013 4.9  3.7 - 5.3 mEq/L Final  . Chloride 12/04/2013 101  96 - 112 mEq/L Final  . CO2 12/04/2013 34* 19 - 32 mEq/L Final  . Glucose, Bld 12/04/2013 116* 70 - 99  mg/dL Final  . BUN 12/04/2013 50* 6 - 23 mg/dL Final  . Creatinine, Ser 12/04/2013 2.19* 0.50 - 1.10 mg/dL Final  . Calcium 12/04/2013 10.2  8.4 - 10.5 mg/dL Final  . Total Protein 12/04/2013 8.3  6.0 - 8.3 g/dL Final  . Albumin 12/04/2013 4.1  3.5 - 5.2 g/dL Final  . AST 12/04/2013 27  0 - 37 U/L Final  . ALT 12/04/2013 11  0 - 35 U/L Final  . Alkaline Phosphatase 12/04/2013 34* 39 - 117 U/L Final  . Total Bilirubin 12/04/2013 0.3  0.3 - 1.2 mg/dL Final  . GFR calc non Af Amer 12/04/2013 20* >90 mL/min Final  . GFR calc Af Amer 12/04/2013 23* >90 mL/min Final   Comment: (NOTE)  The eGFR has been calculated using the CKD EPI equation.                          This calculation has not been validated in all clinical situations.                          eGFR's persistently <90 mL/min signify possible Chronic Kidney                          Disease.  . Troponin I 12/04/2013 <0.30  <0.30 ng/mL Final   Comment:                                 Due to the release kinetics of cTnI,                          a negative result within the first hours                          of the onset of symptoms does not rule out                          myocardial infarction with certainty.                          If myocardial infarction is still suspected,                          repeat the test at appropriate intervals.  . Pro B Natriuretic peptide (BNP) 12/04/2013 2217.0* 0 - 450 pg/mL Final  . Troponin I 12/04/2013 <0.30  <0.30 ng/mL Final   Comment:                                 Due to the release kinetics of cTnI,                          a negative result within the first hours                          of the onset of symptoms does not rule out                          myocardial infarction with certainty.                          If myocardial infarction is still suspected,                          repeat the test at appropriate intervals.    PATHOLOGY: No new  pathology.  Urinalysis    Component Value Date/Time   COLORURINE YELLOW 03/27/2012 1201   APPEARANCEUR CLEAR 03/27/2012 1201   LABSPEC 1.018 03/27/2012 1201   PHURINE 5.5 03/27/2012 1201   GLUCOSEU NEGATIVE 03/27/2012 1201   GLUCOSEU NEG mg/dL 03/22/2010 2313   HGBUR NEGATIVE 03/27/2012 1201   HGBUR negative 05/27/2010  [place] 03/11/2013 [place] 03/27/2012 [place] 03/27/2012 1201   PROTEINUR neg 03/11/2013 1443   PROTEINUR NEGATIVE 03/27/2012 1201   UROBILINOGEN 0.2 03/11/2013 1443   UROBILINOGEN 0.2 03/27/2012 1201   NITRITE neg 03/11/2013 1443   NITRITE NEGATIVE 03/27/2012 1201   LEUKOCYTESUR Negative 03/11/2013 1443    RADIOGRAPHIC STUDIES: Dg Chest 2 View  12/04/2013   CLINICAL DATA:  Right-sided chest pain, history of CHF, cough  EXAM: CHEST  2 VIEW  COMPARISON:  Prior chest x-ray 03/29/2012  FINDINGS: Stable cardiomegaly. Atherosclerotic vascular calcifications. Patient is status post median sternotomy with evidence of aortic and mitral valve replacement. A left subclavian approach cardiac rhythm maintenance device is present. Leads project over the right atrium and ventricle. Pulmonary vascular congestion without overt edema. No large pleural effusion. Background changes of COPD. No pneumothorax or focal airspace consolidation. No acute osseous abnormality. Extensive atherosclerotic vascular calcifications.  IMPRESSION: 1. Pulmonary vascular congestion without overt edema. 2. No definite focal airspace consolidation. 3. Stable cardiomegaly, aortic atherosclerosis and postoperative changes.   Electronically Signed   By: Jacqulynn Cadet M.D.   On: 12/04/2013 15:52    ASSESSMENT:  #1. 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 Reticulocyte count is only between 2 and 3% and in the setting of prior renal disease, the  patient may benefit from Procrit. #2. Status post aortic and mitral valve replacements. #3. Chronic kidney disease. #4. Acute bronchitis.    PLAN:  #1. Procrit 20,000 units subcutaneously and weekly for 4 weeks. #2. Office visit 6 weeks with CBC and reticulocyte count.   All questions were answered. The patient knows to call the clinic with any problems, questions or concerns. We can certainly see the patient much sooner if necessary.   I spent 25 minutes counseling the patient face to face. The total time spent in the appointment was 30 minutes.    Farrel Gobble, MD 01/02/2014 2:50 PM

## 2014-01-02 NOTE — Progress Notes (Signed)
[name] [name] presents today for injection per MD orders. Procrit 20,000 administered SQ in left Abdomen. Administration without incident. Patient tolerated well. Labs drawn from right ac, 23 gauge butterfly. Pt. Tolerated well.

## 2014-01-02 NOTE — Patient Instructions (Signed)
[name] [name] Discharge Instructions  RECOMMENDATIONS MADE BY THE CONSULTANT AND ANY TEST RESULTS WILL BE SENT TO YOUR REFERRING PHYSICIAN. We will see you weekly for the next 4 weeks for procrit injection.   You will see the doctor in 6 weeks.  Thank you for choosing [facility] to provide your oncology and hematology care.  To afford each patient quality time with our providers, please arrive at least 15 minutes before your scheduled appointment time.  With your help, our goal is to use those 15 minutes to complete the necessary work-up to ensure our physicians have the information they need to help with your evaluation and healthcare recommendations.    Effective January 1st, 2014, we ask that you re-schedule your appointment with our physicians should you arrive 10 or more minutes late for your appointment.  We strive to give you quality time with our providers, and arriving late affects you and other patients whose appointments are after yours.    Again, thank you for choosing [facility].  Our hope is that these requests will decrease the amount of time that you wait before being seen by our physicians.       _____________________________________________________________  Should you have questions after your visit to [facility], please contact our office at [phone] between the hours of 8:30 a.m. and 5:00 p.m.  Voicemails left after 4:30 p.m. will not be returned until the following business day.  For prescription refill requests, have your pharmacy contact our office with your prescription refill request.

## 2014-01-03 DIAGNOSIS — J40 Bronchitis, not specified as acute or chronic: Secondary | ICD-10-CM | POA: Diagnosis not present

## 2014-01-03 DIAGNOSIS — D649 Anemia, unspecified: Secondary | ICD-10-CM | POA: Diagnosis not present

## 2014-01-03 DIAGNOSIS — I10 Essential (primary) hypertension: Secondary | ICD-10-CM | POA: Diagnosis not present

## 2014-01-03 DIAGNOSIS — N184 Chronic kidney disease, stage 4 (severe): Secondary | ICD-10-CM | POA: Diagnosis not present

## 2014-01-03 LAB — FERRITIN: Ferritin: 1788 ng/mL — ABNORMAL HIGH (ref 10–291)

## 2014-01-05 LAB — ERYTHROPOIETIN: Erythropoietin: 10.8 m[IU]/mL (ref 2.6–18.5)

## 2014-01-06 ENCOUNTER — Encounter (HOSPITAL_COMMUNITY): Payer: Self-pay | Admitting: Emergency Medicine

## 2014-01-06 ENCOUNTER — Emergency Department (HOSPITAL_COMMUNITY)
Admission: EM | Admit: 2014-01-06 | Discharge: 2014-01-06 | Disposition: A | Discharge status: Home / Self Care | Payer: Medicare Other | Attending: Emergency Medicine | Admitting: Emergency Medicine

## 2014-01-06 ENCOUNTER — Emergency Department (HOSPITAL_COMMUNITY): Payer: Medicare Other

## 2014-01-06 DIAGNOSIS — N181 Chronic kidney disease, stage 1: Secondary | ICD-10-CM | POA: Diagnosis not present

## 2014-01-06 DIAGNOSIS — E119 Type 2 diabetes mellitus without complications: Secondary | ICD-10-CM | POA: Insufficient documentation

## 2014-01-06 DIAGNOSIS — E785 Hyperlipidemia, unspecified: Secondary | ICD-10-CM | POA: Insufficient documentation

## 2014-01-06 DIAGNOSIS — J441 Chronic obstructive pulmonary disease with (acute) exacerbation: Secondary | ICD-10-CM | POA: Insufficient documentation

## 2014-01-06 DIAGNOSIS — G4700 Insomnia, unspecified: Secondary | ICD-10-CM | POA: Diagnosis not present

## 2014-01-06 DIAGNOSIS — D509 Iron deficiency anemia, unspecified: Secondary | ICD-10-CM | POA: Insufficient documentation

## 2014-01-06 DIAGNOSIS — Z859 Personal history of malignant neoplasm, unspecified: Secondary | ICD-10-CM | POA: Diagnosis not present

## 2014-01-06 DIAGNOSIS — I129 Hypertensive chronic kidney disease with stage 1 through stage 4 chronic kidney disease, or unspecified chronic kidney disease: Secondary | ICD-10-CM | POA: Diagnosis not present

## 2014-01-06 DIAGNOSIS — J449 Chronic obstructive pulmonary disease, unspecified: Secondary | ICD-10-CM | POA: Diagnosis not present

## 2014-01-06 DIAGNOSIS — Z880 Allergy status to penicillin: Secondary | ICD-10-CM | POA: Diagnosis not present

## 2014-01-06 DIAGNOSIS — Z860100 Personal history of colon polyps, unspecified: Secondary | ICD-10-CM | POA: Insufficient documentation

## 2014-01-06 DIAGNOSIS — J811 Chronic pulmonary edema: Secondary | ICD-10-CM | POA: Insufficient documentation

## 2014-01-06 DIAGNOSIS — Z95810 Presence of automatic (implantable) cardiac defibrillator: Secondary | ICD-10-CM | POA: Insufficient documentation

## 2014-01-06 DIAGNOSIS — E039 Hypothyroidism, unspecified: Secondary | ICD-10-CM | POA: Insufficient documentation

## 2014-01-06 DIAGNOSIS — Z79899 Other long term (current) drug therapy: Secondary | ICD-10-CM | POA: Diagnosis not present

## 2014-01-06 DIAGNOSIS — K219 Gastro-esophageal reflux disease without esophagitis: Secondary | ICD-10-CM | POA: Insufficient documentation

## 2014-01-06 DIAGNOSIS — IMO0002 Reserved for concepts with insufficient information to code with codable children: Secondary | ICD-10-CM | POA: Insufficient documentation

## 2014-01-06 DIAGNOSIS — Z8719 Personal history of other diseases of the digestive system: Secondary | ICD-10-CM | POA: Insufficient documentation

## 2014-01-06 DIAGNOSIS — E669 Obesity, unspecified: Secondary | ICD-10-CM | POA: Insufficient documentation

## 2014-01-06 DIAGNOSIS — Z9889 Other specified postprocedural states: Secondary | ICD-10-CM | POA: Diagnosis not present

## 2014-01-06 DIAGNOSIS — I509 Heart failure, unspecified: Secondary | ICD-10-CM | POA: Diagnosis not present

## 2014-01-06 DIAGNOSIS — R011 Cardiac murmur, unspecified: Secondary | ICD-10-CM | POA: Diagnosis not present

## 2014-01-06 DIAGNOSIS — Z87891 Personal history of nicotine dependence: Secondary | ICD-10-CM | POA: Diagnosis not present

## 2014-01-06 DIAGNOSIS — I10 Essential (primary) hypertension: Secondary | ICD-10-CM | POA: Diagnosis not present

## 2014-01-06 MED ORDER — PREDNISONE 50 MG PO TABS
60.0000 mg | ORAL_TABLET | Freq: Once | ORAL | Status: AC
  Administered 2014-01-06: 60 mg via ORAL
  Filled 2014-01-06 (×2): qty 1

## 2014-01-06 MED ORDER — FUROSEMIDE 40 MG PO TABS
40.0000 mg | ORAL_TABLET | Freq: Once | ORAL | Status: AC
  Administered 2014-01-06: 40 mg via ORAL
  Filled 2014-01-06: qty 1

## 2014-01-06 MED ORDER — PREDNISONE 20 MG PO TABS: ORAL_TABLET | ORAL | Status: DC

## 2014-01-06 MED ORDER — ALBUTEROL SULFATE (2.5 MG/3ML) 0.083% IN NEBU
2.5000 mg | INHALATION_SOLUTION | Freq: Once | RESPIRATORY_TRACT | Status: AC
  Administered 2014-01-06: 2.5 mg via RESPIRATORY_TRACT
  Filled 2014-01-06: qty 3

## 2014-01-06 MED ORDER — IPRATROPIUM-ALBUTEROL 0.5-2.5 (3) MG/3ML IN SOLN
3.0000 mL | Freq: Once | RESPIRATORY_TRACT | Status: AC
  Administered 2014-01-06: 3 mL via RESPIRATORY_TRACT
  Filled 2014-01-06: qty 3

## 2014-01-06 NOTE — ED Notes (Signed)
Pt presents with chest congestion, shortness of breath, and cough with small amount of yellow sputum. Pt reports symptoms started 3 weeks ago, and have gotten progressively worse with time. Pt reports wearing 4L [place] at home during the night. Pt denies swelling in extremities. Pt in NAD. Pt alert and oriented. Airway patent.

## 2014-01-06 NOTE — ED Provider Notes (Signed)
CSN: [id number]     Arrival date & time 01/06/14  0932 History  This chart was scribed for [name] Christen, MD by Roxan Diesel, ED scribe.  This patient was seen in room APA07/APA07 and the patient's care was started at 9:40 AM.   Chief Complaint  Patient presents with  . Shortness of Breath    The history is provided by the patient. No language interpreter was used.    HPI Comments: [name] [name] is a 80 y.o. female who presents to the Emergency Department complaining of persistent, progressively-worsening SOB that began 2 weeks ago.  Pt states she was diagnosed with bronchitis at Urgent Care.  She states her symptoms have continued to progressively worsen.  She is still SOB and wheezing.  She reports cough productive of a small amount of yellow sputum.  Pt wears 4L [place] at night.  She is not on dialysis.  She reports h/o pulmonary edema several years ago.   Past Medical History  Diagnosis Date  . DJD (degenerative joint disease) of lumbar spine   . Chronic kidney disease, stage I   . Esophageal reflux   . Other and unspecified hyperlipidemia   . Other primary cardiomyopathies   . Obesity, unspecified   . Unspecified hypothyroidism   . DM (diabetes mellitus)   . Essential hypertension, benign   . 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 Pain in joint, pelvic region and thigh   . Insomnia, unspecified   . Iron deficiency 10/04/2011  . IDA (iron deficiency anemia)     Parenteral iron/[name]  . CHF (congestive heart failure)   . COPD (chronic obstructive pulmonary disease)   . Heart murmur   . ICD (implantable cardiac defibrillator) in place   . Adenomatous polyp of colon 12/16/2002    Dr. [name]/[facility]  . Hyperlipemia   . Hemolysis 09/24/2012  . Cancer     Past Surgical History  Procedure Laterality Date  . Thyroidectomy    . Pacemaker placed  2004  . Aortic and mitral valve replacement  2001  . Right breast  cyst removed      benign   . Right cataract removed      2005  . Defibrillator implanted 2006    . Cardiac valve replacement    . Cardiac catheterization    . Insert / replace / remove pacemaker    . Esophagogastroduodenoscopy  06/12/2007    Rourk- normal esophagus, small hiatal hernia, otherwise normal stomach, D1, D2  . Colonoscopy  06/12/2007    Rourk- Normal rectum/Normal colon  . Colonoscopy  12/16/02    small hemorrhoids  . Colonoscopy  06/20/2012    Procedure: COLONOSCOPY;  Surgeon: Daneil Dolin, MD;  Location: [facility];  Service: Endoscopy;  Laterality: N/A;  10:45    Family History  Problem Relation Age of Onset  . Cancer Mother     behind pancres  . Heart disease Father   . Heart disease Sister     Heart attack  . Heart disease Brother   . Diabetes Brother     History  Substance Use Topics  . Smoking status: Former Smoker -- 0.75 packs/day for 40 years    Types: Cigarettes    Quit date: 03/08/1987  . Smokeless tobacco: Never Used  . Alcohol Use: No    OB History   Grav Para Term Preterm Abortions TAB SAB Ect Mult Living  0       Review of Systems A complete 10 system review of systems was obtained and all systems are negative except as noted in the HPI and PMH.     Allergies  Aspirin; Niacin; and Penicillins  Home Medications   Prior to Admission medications   Medication Sig Start Date End Date Taking? Authorizing Provider  acetaminophen (TYLENOL) 325 MG tablet Take 325-650 mg by mouth every 6 (six) hours as needed for mild pain, moderate pain or headache.    Historical Provider, MD  albuterol (PROVENTIL) (2.5 MG/3ML) 0.083% nebulizer solution Take 2.5 mg by nebulization 2 (two) times daily as needed for wheezing or shortness of breath.    Historical Provider, MD  allopurinol (ZYLOPRIM) 100 MG tablet take 1 tablet by mouth once daily    Fayrene Helper, MD  benzonatate (TESSALON) 100 MG capsule Take 1 capsule (100 mg total) by  mouth 3 (three) times daily. 12/30/13   Fayrene Helper, MD  Bioflavonoid Products (ESTER-C) 1000-50 MG TABS Take 1 tablet by mouth 3 (three) times a week.     Historical Provider, MD  digoxin (LANOXIN) 0.125 MG tablet Take 0.125 mg by mouth every morning.     Historical Provider, MD  ezetimibe-simvastatin (VYTORIN) 10-20 MG per tablet Take 1 tablet by mouth every Monday, Wednesday, and Friday. 10/23/13   Fayrene Helper, MD  fluticasone (FLONASE) 50 MCG/ACT nasal spray Place 2 sprays into both nostrils daily. 12/17/13   Fayrene Helper, MD  Fluticasone-Salmeterol (ADVAIR DISKUS) 100-50 MCG/DOSE AEPB Inhale 1 puff into the lungs 2 (two) times daily.  10/23/13   Fayrene Helper, MD  folic acid (FOLVITE) 1 MG tablet take 1 tablet by mouth once daily 02/03/13   Pieter Partridge, MD  furosemide (LASIX) 40 MG tablet Take 1 tablet (40 mg total) by mouth daily. 09/30/13 01/13/20  Fayrene Helper, MD  gabapentin (NEURONTIN) 300 MG capsule Take 300 mg by mouth at bedtime.    Historical Provider, MD  HYDROcodone-acetaminophen (NORCO/VICODIN) 5-325 MG per tablet Take 1 tablet by mouth every 6 (six) hours as needed for pain.    Historical Provider, MD  isosorbide-hydrALAZINE (BIDIL) 20-37.5 MG per tablet Take 1 tablet by mouth 2 (two) times daily. 07/15/13   Fayrene Helper, MD  levothyroxine (SYNTHROID, LEVOTHROID) 75 MCG tablet Take 1 tablet (75 mcg total) by mouth daily. 10/23/13   Fayrene Helper, MD  losartan (COZAAR) 50 MG tablet Take 50 mg by mouth every morning.  06/17/12   Fayrene Helper, MD  metolazone (ZAROXOLYN) 2.5 MG tablet Take 1 tablet (2.5 mg total) by mouth once a week. One every 7 days 08/11/13   Lorretta Harp, MD  metoprolol succinate (TOPROL XL) 100 MG 24 hr tablet Take 100 mg by mouth every evening. Take with or immediately following a meal. 10/23/13 10/23/14  Fayrene Helper, MD  Multiple Vitamins-Minerals (CENTRUM SILVER ADULT 50+) TABS Take 1 tablet by mouth once a week.     Historical Provider, MD  ondansetron (ZOFRAN) 4 MG tablet Take 1 tablet (4 mg total) by mouth daily. 11/04/13   Fayrene Helper, MD  polyethylene glycol powder [facility]) powder Take 17 g by mouth daily as needed for mild constipation, moderate constipation or severe constipation.     Historical Provider, MD  potassium chloride SA (K-DUR,KLOR-CON) 20 MEQ tablet Take 1 tablet (20 mEq total) by mouth daily. 07/15/13   Fayrene Helper, MD  warfarin (COUMADIN) 5 MG  tablet Take 7.5 mg by mouth every evening. 10/23/13   Fayrene Helper, MD   BP 118/87  Pulse 72  Temp(Src) 98.5 F (36.9 C) (Oral)  Ht 5\' 7"  (1.702 m)  Wt 233 lb (105.688 kg)  BMI 36.48 kg/m2  SpO2 95%  Physical Exam  Nursing note and vitals reviewed. Constitutional: She is oriented to person, place, and time. She appears well-developed and well-nourished.  Appears comfortable on oxygen  HENT:  Head: Normocephalic and atraumatic.  Eyes: Conjunctivae and EOM are normal. Pupils are equal, round, and reactive to light.  Neck: Normal range of motion. Neck supple.  Cardiovascular: Normal rate, regular rhythm and normal heart sounds.   Pulmonary/Chest: Effort normal. She has wheezes.  Wheezing bilaterally   Abdominal: Soft. Bowel sounds are normal.  Musculoskeletal: Normal range of motion.  Neurological: She is alert and oriented to person, place, and time.  Skin: Skin is warm and dry.  Psychiatric: She has a normal mood and affect. Her behavior is normal.    ED Course  Procedures (including critical care time)  DIAGNOSTIC STUDIES: Oxygen Saturation is 95% on [place], adequate by my interpretation.    COORDINATION OF CARE: 9:44 AM-Discussed treatment plan which includes breathing treatment, CXR, and prednisone with pt at bedside and pt agreed to plan.     Dg Chest 2 View  01/06/2014   CLINICAL DATA:  SHORTNESS OF BREATH  EXAM: CHEST  2 VIEW  COMPARISON:  DG CHEST 2 VIEW dated 12/04/2013; DG CHEST 2 VIEW dated  06/08/2010  FINDINGS: There is flattening of the hemidiaphragms. Cardiac silhouette is enlarged. Patient is status post median sternotomy and mitral and aortic valve replacement. Atherosclerotic calcifications are appreciated within the aorta A left chest wall dual chamber AICD. There is mild prominence of the interstitial markings and areas of mild peribronchial cuffing have developed. No focal regions of consolidation or focal infiltrates. Stable chronic compression deformity within the mid thoracic spine.  IMPRESSION: COPD  Mild pulmonary edema  No focal regions of consolidation or focal infiltrates.  Stable cardiomegaly   Electronically Signed   By: Margaree Mackintosh M.D.   On: 01/06/2014 11:04     MDM   Final diagnoses:  Pulmonary edema  COPD (chronic obstructive pulmonary disease)    Patient is in minimal distress. Chest x-ray shows mild pulmonary edema. Will give additional dose of Lasix in the emergency department. Recommend increasing Lasix to 60 mg daily. Will add prednisone daily for 5 days. Patient is in no acute distress at discharge. Follow her Dr. this week I personally performed the services described in this documentation, which was scribed in my presence. The recorded information has been reviewed and is accurate.    [name] Christen, MD 01/06/14 1325

## 2014-01-06 NOTE — Discharge Instructions (Signed)
Increase Lasix to 1-1/2 tablets daily.   Prescription for prednisone. Start tomorrow. Followup your Dr. this week.

## 2014-01-08 ENCOUNTER — Ambulatory Visit (INDEPENDENT_AMBULATORY_CARE_PROVIDER_SITE_OTHER): Payer: Medicare Other | Admitting: Otolaryngology

## 2014-01-08 DIAGNOSIS — R040 Epistaxis: Secondary | ICD-10-CM

## 2014-01-09 ENCOUNTER — Encounter (HOSPITAL_BASED_OUTPATIENT_CLINIC_OR_DEPARTMENT_OTHER): Payer: Medicare Other

## 2014-01-09 ENCOUNTER — Ambulatory Visit (INDEPENDENT_AMBULATORY_CARE_PROVIDER_SITE_OTHER): Payer: Medicare Other | Admitting: *Deleted

## 2014-01-09 ENCOUNTER — Encounter (HOSPITAL_COMMUNITY): Payer: Medicare Other

## 2014-01-09 DIAGNOSIS — I428 Other cardiomyopathies: Secondary | ICD-10-CM

## 2014-01-09 DIAGNOSIS — D594 Other nonautoimmune hemolytic anemias: Secondary | ICD-10-CM

## 2014-01-09 DIAGNOSIS — I5022 Chronic systolic (congestive) heart failure: Secondary | ICD-10-CM | POA: Diagnosis not present

## 2014-01-09 DIAGNOSIS — D649 Anemia, unspecified: Secondary | ICD-10-CM | POA: Diagnosis not present

## 2014-01-09 DIAGNOSIS — D509 Iron deficiency anemia, unspecified: Secondary | ICD-10-CM | POA: Diagnosis not present

## 2014-01-09 LAB — MDC_IDC_ENUM_SESS_TYPE_INCLINIC
Battery Voltage: 2.76 V
Brady Statistic AP VP Percent: 78.06 %
Brady Statistic AP VS Percent: 0 %
Brady Statistic AS VP Percent: 21.91 %
Brady Statistic AS VS Percent: 0.03 %
Brady Statistic RA Percent Paced: 78.07 %
Brady Statistic RV Percent Paced: 99.97 %
Date Time Interrogation Session: 20150417113241
HighPow Impedance: 38 Ohm
HighPow Impedance: 46 Ohm
Lead Channel Impedance Value: 342 Ohm
Lead Channel Impedance Value: 437 Ohm
Lead Channel Pacing Threshold Amplitude: 0.625 V
Lead Channel Pacing Threshold Amplitude: 1.25 V
Lead Channel Pacing Threshold Pulse Width: 0.4 ms
Lead Channel Pacing Threshold Pulse Width: 0.4 ms
Lead Channel Sensing Intrinsic Amplitude: 0.625 mV
Lead Channel Sensing Intrinsic Amplitude: 1.875 mV
Lead Channel Setting Pacing Amplitude: 1.5 V
Lead Channel Setting Pacing Amplitude: 2.75 V
Lead Channel Setting Pacing Pulse Width: 0.4 ms
Lead Channel Setting Sensing Sensitivity: 0.3 mV
Zone Setting Detection Interval: 320 ms
Zone Setting Detection Interval: 350 ms
Zone Setting Detection Interval: 360 ms
Zone Setting Detection Interval: 410 ms

## 2014-01-09 LAB — CBC
HCT: 34.9 % — ABNORMAL LOW (ref 36.0–46.0)
Hemoglobin: 11.6 g/dL — ABNORMAL LOW (ref 12.0–15.0)
MCH: 31.9 pg (ref 26.0–34.0)
MCHC: 33.2 g/dL (ref 30.0–36.0)
MCV: 95.9 fL (ref 78.0–100.0)
Platelets: 269 10*3/uL (ref 150–400)
RBC: 3.64 MIL/uL — ABNORMAL LOW (ref 3.87–5.11)
RDW: 14.4 % (ref 11.5–15.5)
WBC: 7.5 10*3/uL (ref 4.0–10.5)

## 2014-01-09 NOTE — Progress Notes (Signed)
ICD check in office with ICM. 

## 2014-01-09 NOTE — Progress Notes (Signed)
Labs drawn today for cbc 

## 2014-01-13 DIAGNOSIS — M25569 Pain in unspecified knee: Secondary | ICD-10-CM | POA: Diagnosis not present

## 2014-01-15 ENCOUNTER — Other Ambulatory Visit (HOSPITAL_COMMUNITY): Payer: Self-pay

## 2014-01-15 ENCOUNTER — Encounter (HOSPITAL_COMMUNITY): Payer: Medicare Other | Attending: Hematology and Oncology

## 2014-01-15 ENCOUNTER — Encounter (HOSPITAL_COMMUNITY): Payer: Medicare Other

## 2014-01-15 DIAGNOSIS — D638 Anemia in other chronic diseases classified elsewhere: Secondary | ICD-10-CM

## 2014-01-15 DIAGNOSIS — D649 Anemia, unspecified: Secondary | ICD-10-CM | POA: Diagnosis not present

## 2014-01-15 DIAGNOSIS — D594 Other nonautoimmune hemolytic anemias: Secondary | ICD-10-CM | POA: Diagnosis not present

## 2014-01-15 LAB — CBC WITH DIFFERENTIAL/PLATELET
Basophils Absolute: 0 10*3/uL (ref 0.0–0.1)
Basophils Relative: 0 % (ref 0–1)
Eosinophils Absolute: 0 10*3/uL (ref 0.0–0.7)
Eosinophils Relative: 0 % (ref 0–5)
HCT: 35.1 % — ABNORMAL LOW (ref 36.0–46.0)
Hemoglobin: 11.7 g/dL — ABNORMAL LOW (ref 12.0–15.0)
Lymphocytes Relative: 21 % (ref 12–46)
Lymphs Abs: 1.8 10*3/uL (ref 0.7–4.0)
MCH: 32 pg (ref 26.0–34.0)
MCHC: 33.3 g/dL (ref 30.0–36.0)
MCV: 95.9 fL (ref 78.0–100.0)
Monocytes Absolute: 0.7 10*3/uL (ref 0.1–1.0)
Monocytes Relative: 8 % (ref 3–12)
Neutro Abs: 6.1 10*3/uL (ref 1.7–7.7)
Neutrophils Relative %: 71 % (ref 43–77)
Platelets: 230 10*3/uL (ref 150–400)
RBC: 3.66 MIL/uL — ABNORMAL LOW (ref 3.87–5.11)
RDW: 14.7 % (ref 11.5–15.5)
WBC: 8.7 10*3/uL (ref 4.0–10.5)

## 2014-01-15 NOTE — Progress Notes (Signed)
Hemoglobin today 11.7. Procrit not given, treatment parameters not met. RTC in 1 week.

## 2014-01-15 NOTE — Progress Notes (Signed)
Labs drawn today for cbc/diff 

## 2014-01-16 ENCOUNTER — Other Ambulatory Visit (HOSPITAL_COMMUNITY): Payer: Medicare Other

## 2014-01-16 ENCOUNTER — Ambulatory Visit (HOSPITAL_COMMUNITY): Payer: Medicare Other

## 2014-01-18 IMAGING — US US BREAST*L*
1 series · 4 of 4 positions shown · non-contrast
Comparison: 03/12/2013, 02/28/2012, 02/22/2011.

CLINICAL DATA: Patient has a history of a left breast subareolar
mass felt to represent most likely a papilloma. Due to the patient's
medical condition, surgical excision was not performed. Followup
evaluation.

EXAM:
DIGITAL DIAGNOSTIC  bilateral MAMMOGRAM WITH CAD
ULTRASOUND left BREAST

[Series 1: us breast*left* · 0.08mm/px · 4 of 4 slices shown]
[im 1/4]
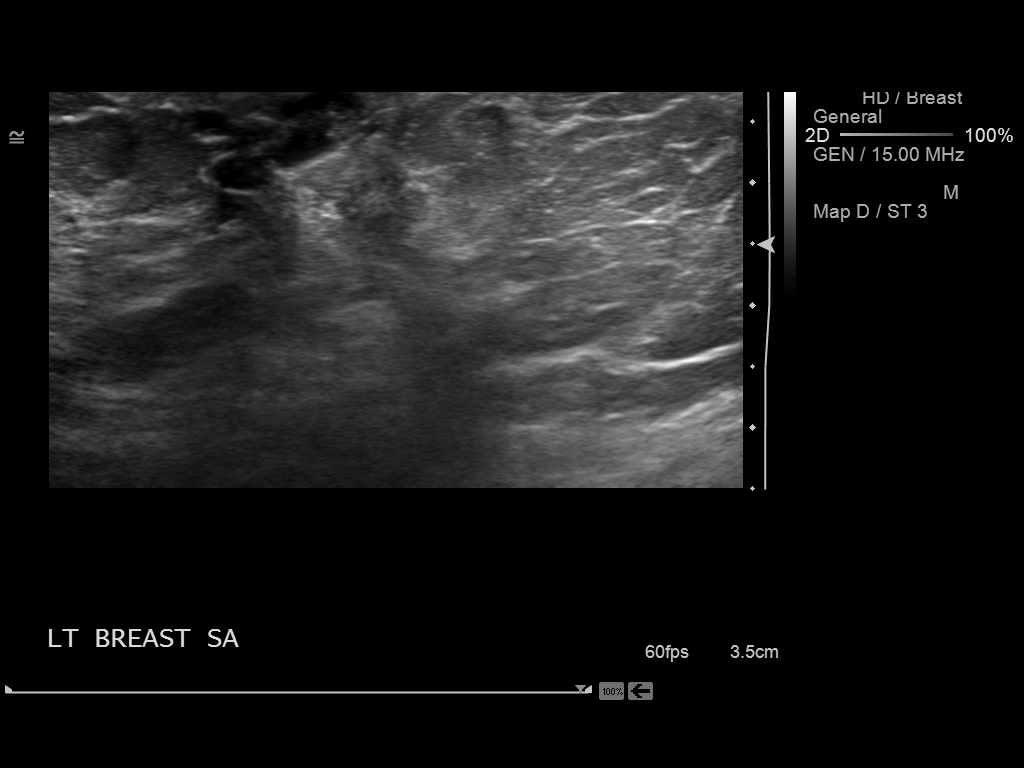
[im 2/4]
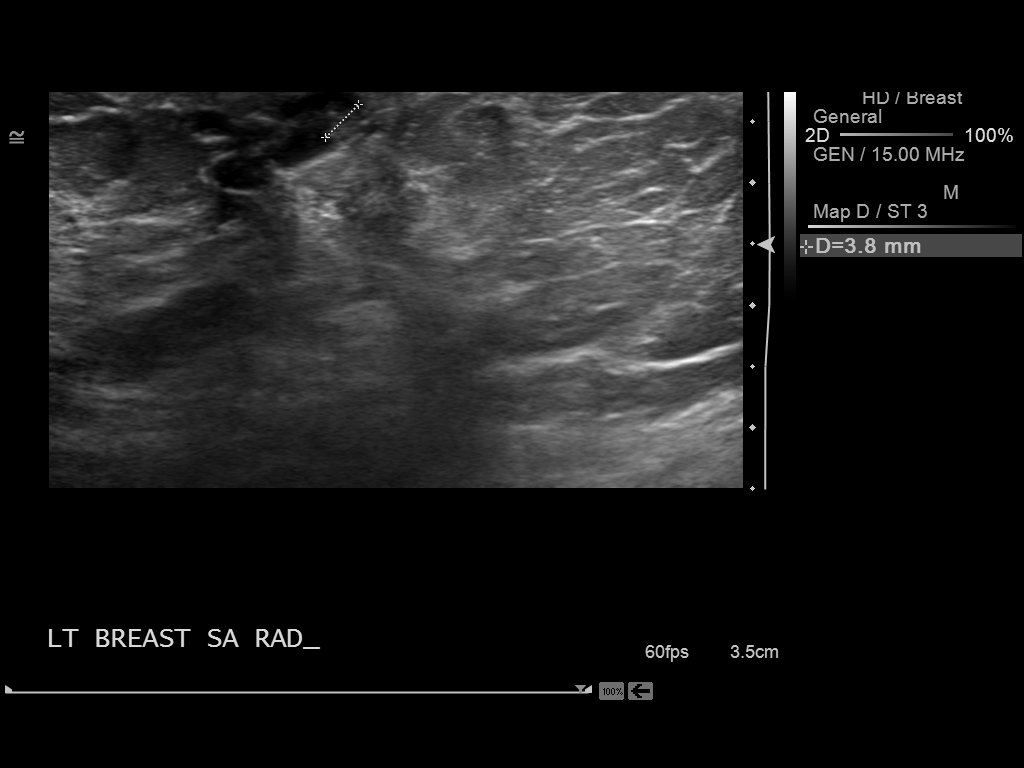
[im 3/4]
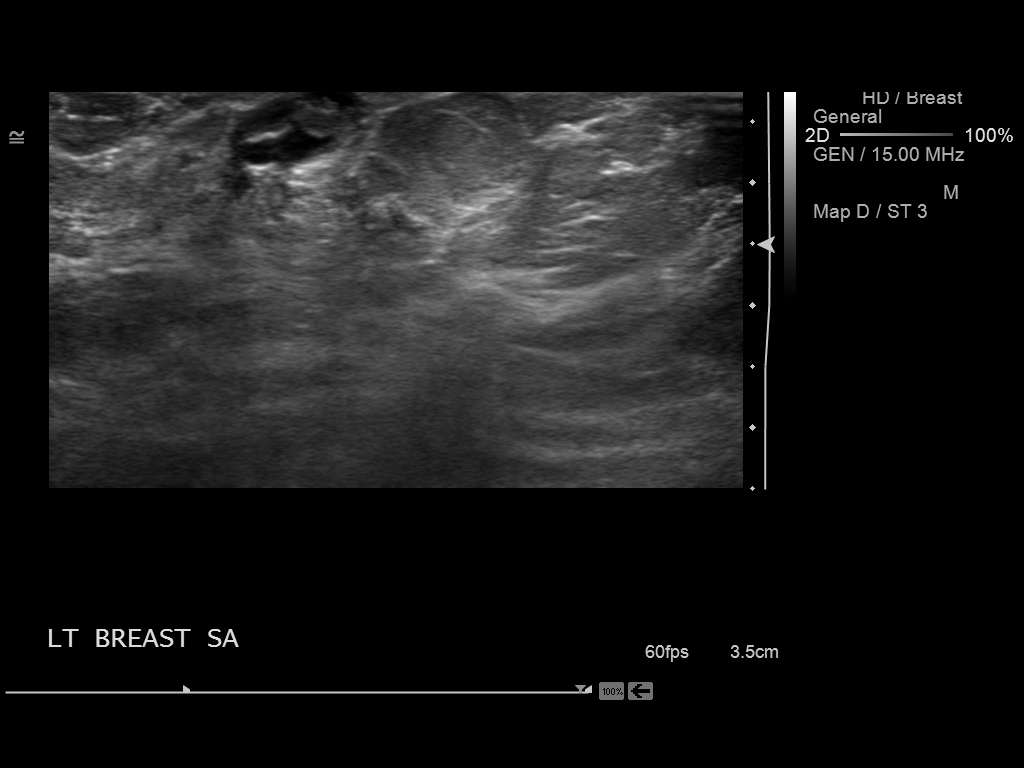
[im 4/4]
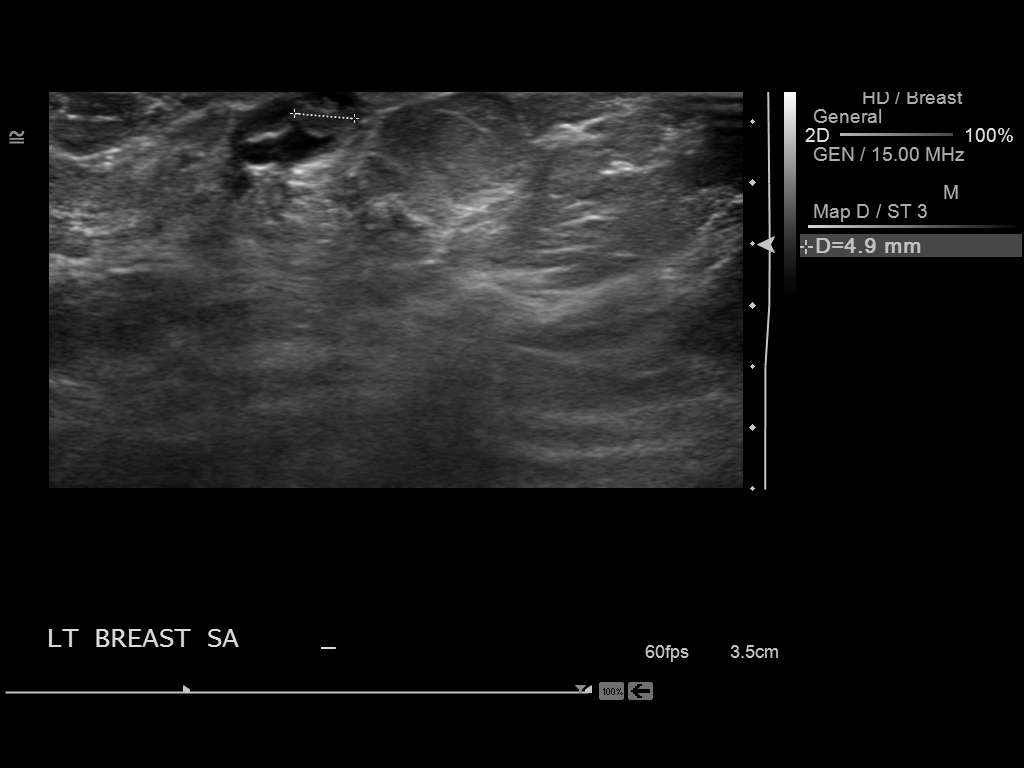

[4 of 4 positions shown; findings below may reference images not displayed]

ACR Breast Density Category c: The breast tissue is heterogeneously
dense, which may obscure small masses.
FINDINGS: There is a stable breast parenchymal pattern present. There is no
worrisome mass, distortion, or worrisome calcification within either
breast.

Mammographic images were processed with CAD.

On physical exam, there is no discrete palpable abnormality within
the subareolar left breast. There is no nipple discharge expressed
at this time.

Ultrasound is performed, showing ectatic ducts within the subareolar
portion of the left breast with a 5 x 4 x 3 mm intraductal mass
again noted -stable when compared with the prior study.
IMPRESSION: Stable breast parenchymal pattern and stable left breast subareolar
intraductal mass most likely representing a small papilloma. Due to
the patient's medical condition, surgical excision was not felt to
be indicated. As result, recommend follow-up bilateral diagnostic
mammography and left breast ultrasound in 12 months.

RECOMMENDATION:
Bilateral diagnostic mammography and left breast ultrasound in 12
months.

I have discussed the findings and recommendations with the patient.
Results were also provided in writing at the conclusion of the
visit. If applicable, a reminder letter will be sent to the patient
regarding the next appointment.

BI-RADS CATEGORY  4: Suspicious.

## 2014-01-19 ENCOUNTER — Ambulatory Visit (INDEPENDENT_AMBULATORY_CARE_PROVIDER_SITE_OTHER): Payer: Medicare Other | Admitting: *Deleted

## 2014-01-19 ENCOUNTER — Telehealth: Payer: Self-pay | Admitting: Family Medicine

## 2014-01-19 DIAGNOSIS — Z952 Presence of prosthetic heart valve: Secondary | ICD-10-CM

## 2014-01-19 DIAGNOSIS — E039 Hypothyroidism, unspecified: Secondary | ICD-10-CM

## 2014-01-19 DIAGNOSIS — M899 Disorder of bone, unspecified: Secondary | ICD-10-CM

## 2014-01-19 DIAGNOSIS — E785 Hyperlipidemia, unspecified: Secondary | ICD-10-CM

## 2014-01-19 DIAGNOSIS — Z954 Presence of other heart-valve replacement: Secondary | ICD-10-CM

## 2014-01-19 DIAGNOSIS — Z7901 Long term (current) use of anticoagulants: Secondary | ICD-10-CM | POA: Diagnosis not present

## 2014-01-19 DIAGNOSIS — Z5181 Encounter for therapeutic drug level monitoring: Secondary | ICD-10-CM

## 2014-01-19 DIAGNOSIS — M949 Disorder of cartilage, unspecified: Secondary | ICD-10-CM

## 2014-01-19 DIAGNOSIS — E1129 Type 2 diabetes mellitus with other diabetic kidney complication: Secondary | ICD-10-CM

## 2014-01-19 DIAGNOSIS — I10 Essential (primary) hypertension: Secondary | ICD-10-CM

## 2014-01-19 LAB — POCT INR: INR: 5.9

## 2014-01-19 NOTE — Addendum Note (Signed)
Addended by: Eual Fines on: 01/19/2014 02:51 PM   Modules accepted: Orders

## 2014-01-19 NOTE — Telephone Encounter (Signed)
Ordered and mailed to her and left her a message

## 2014-01-19 NOTE — Telephone Encounter (Signed)
Does she need any labs ordered, wants to collect

## 2014-01-19 NOTE — Telephone Encounter (Signed)
Fasting lipid, cmp and EGFr and HBA1C and vit D

## 2014-01-23 ENCOUNTER — Encounter (HOSPITAL_BASED_OUTPATIENT_CLINIC_OR_DEPARTMENT_OTHER): Payer: Medicare Other

## 2014-01-23 ENCOUNTER — Encounter (HOSPITAL_COMMUNITY): Payer: Medicare Other | Attending: Internal Medicine

## 2014-01-23 DIAGNOSIS — E785 Hyperlipidemia, unspecified: Secondary | ICD-10-CM | POA: Diagnosis not present

## 2014-01-23 DIAGNOSIS — D509 Iron deficiency anemia, unspecified: Secondary | ICD-10-CM | POA: Insufficient documentation

## 2014-01-23 DIAGNOSIS — D594 Other nonautoimmune hemolytic anemias: Secondary | ICD-10-CM

## 2014-01-23 DIAGNOSIS — D649 Anemia, unspecified: Secondary | ICD-10-CM | POA: Diagnosis not present

## 2014-01-23 DIAGNOSIS — D638 Anemia in other chronic diseases classified elsewhere: Secondary | ICD-10-CM | POA: Insufficient documentation

## 2014-01-23 DIAGNOSIS — M949 Disorder of cartilage, unspecified: Secondary | ICD-10-CM | POA: Diagnosis not present

## 2014-01-23 DIAGNOSIS — E1129 Type 2 diabetes mellitus with other diabetic kidney complication: Secondary | ICD-10-CM | POA: Diagnosis not present

## 2014-01-23 DIAGNOSIS — M899 Disorder of bone, unspecified: Secondary | ICD-10-CM | POA: Diagnosis not present

## 2014-01-23 LAB — CBC WITH DIFFERENTIAL/PLATELET
Basophils Absolute: 0 10*3/uL (ref 0.0–0.1)
Basophils Relative: 0 % (ref 0–1)
Eosinophils Absolute: 0 10*3/uL (ref 0.0–0.7)
Eosinophils Relative: 0 % (ref 0–5)
HCT: 34.3 % — ABNORMAL LOW (ref 36.0–46.0)
Hemoglobin: 11 g/dL — ABNORMAL LOW (ref 12.0–15.0)
Lymphocytes Relative: 15 % (ref 12–46)
Lymphs Abs: 1.1 10*3/uL (ref 0.7–4.0)
MCH: 31.2 pg (ref 26.0–34.0)
MCHC: 32.1 g/dL (ref 30.0–36.0)
MCV: 97.2 fL (ref 78.0–100.0)
Monocytes Absolute: 0.6 10*3/uL (ref 0.1–1.0)
Monocytes Relative: 8 % (ref 3–12)
Neutro Abs: 5.6 10*3/uL (ref 1.7–7.7)
Neutrophils Relative %: 77 % (ref 43–77)
Platelets: 208 10*3/uL (ref 150–400)
RBC: 3.53 MIL/uL — ABNORMAL LOW (ref 3.87–5.11)
RDW: 14.2 % (ref 11.5–15.5)
WBC: 7.3 10*3/uL (ref 4.0–10.5)

## 2014-01-23 NOTE — Progress Notes (Signed)
[name] [name] presented for labwork. Labs per MD order drawn via Peripheral Line 23 gauge needle inserted in right antecubital.  Good blood return present. Procedure without incident.  Needle removed intact. Patient tolerated procedure well.  Spoke with Dr.Formanek regarding supportive therapy. His instructions are to not give procrit today for hemoglobin 11. Instruct pt to return to clinic as scheduled next week for lab work +/- procrit.

## 2014-01-24 LAB — COMPLETE METABOLIC PANEL WITH GFR
ALT: 13 U/L (ref 0–35)
AST: 26 U/L (ref 0–37)
Albumin: 4.3 g/dL (ref 3.5–5.2)
Alkaline Phosphatase: 28 U/L — ABNORMAL LOW (ref 39–117)
BUN: 62 mg/dL — ABNORMAL HIGH (ref 6–23)
CO2: 28 mEq/L (ref 19–32)
Calcium: 9.9 mg/dL (ref 8.4–10.5)
Chloride: 102 mEq/L (ref 96–112)
Creat: 2.17 mg/dL — ABNORMAL HIGH (ref 0.50–1.10)
GFR, Est African American: 24 mL/min — ABNORMAL LOW
GFR, Est Non African American: 21 mL/min — ABNORMAL LOW
Glucose, Bld: 98 mg/dL (ref 70–99)
Potassium: 4.2 mEq/L (ref 3.5–5.3)
Sodium: 140 mEq/L (ref 135–145)
Total Bilirubin: 0.6 mg/dL (ref 0.2–1.2)
Total Protein: 7.9 g/dL (ref 6.0–8.3)

## 2014-01-24 LAB — LIPID PANEL
Cholesterol: 129 mg/dL (ref 0–200)
HDL: 43 mg/dL (ref >39)
LDL Cholesterol: 65 mg/dL (ref 0–99)
Total CHOL/HDL Ratio: 3 Ratio
Triglycerides: 103 mg/dL (ref <150)
VLDL: 21 mg/dL (ref 0–40)

## 2014-01-24 LAB — HEMOGLOBIN A1C
Hgb A1c MFr Bld: 6.2 % — ABNORMAL HIGH (ref <5.7)
Mean Plasma Glucose: 131 mg/dL — ABNORMAL HIGH (ref <117)

## 2014-01-24 LAB — VITAMIN D 25 HYDROXY (VIT D DEFICIENCY, FRACTURES): Vit D, 25-Hydroxy: 49 ng/mL (ref 30–89)

## 2014-01-26 ENCOUNTER — Ambulatory Visit (INDEPENDENT_AMBULATORY_CARE_PROVIDER_SITE_OTHER): Payer: Medicare Other | Admitting: *Deleted

## 2014-01-26 ENCOUNTER — Telehealth: Payer: Self-pay

## 2014-01-26 DIAGNOSIS — Z7901 Long term (current) use of anticoagulants: Secondary | ICD-10-CM

## 2014-01-26 DIAGNOSIS — Z954 Presence of other heart-valve replacement: Secondary | ICD-10-CM | POA: Diagnosis not present

## 2014-01-26 DIAGNOSIS — Z5181 Encounter for therapeutic drug level monitoring: Secondary | ICD-10-CM

## 2014-01-26 DIAGNOSIS — Z952 Presence of prosthetic heart valve: Secondary | ICD-10-CM

## 2014-01-26 LAB — POCT INR: INR: 2.3

## 2014-01-26 NOTE — Telephone Encounter (Signed)
Patient aware.  States that she would like to try Meclizine first before referral.  Will try taking Meclizine more often and will call back if she feels that she needs referral.

## 2014-01-26 NOTE — Telephone Encounter (Signed)
pls let her know safe to take the flonase daily if it is not irritating her nose. Also should add claritin OTC 10mg  one daily if allergies are uncontrolled and also saline /netty po nasal flushes 2 to 3 times daily Try to keep head steady Use meclizine one  Up 3 times daily. If vertigo is very severe and she feels at high risk for fall, then will ned to see NT urgently will refer to one pls enter referral if she is interested in this, I will sign

## 2014-01-26 NOTE — Telephone Encounter (Signed)
Spoke with patient who states that she is having problems with dizziness that started on yesterday.  Took 1 meclizine and states that she had no relief with this.  Please advise.  States that she does have some sinus drainage. And only takes Flonase for 2 days and then stops per recommendation from another dr.

## 2014-01-30 ENCOUNTER — Encounter (HOSPITAL_BASED_OUTPATIENT_CLINIC_OR_DEPARTMENT_OTHER): Payer: Medicare Other

## 2014-01-30 ENCOUNTER — Encounter: Payer: Self-pay | Admitting: Internal Medicine

## 2014-01-30 VITALS — BP 126/66 | HR 85 | Resp 22

## 2014-01-30 DIAGNOSIS — D594 Other nonautoimmune hemolytic anemias: Secondary | ICD-10-CM

## 2014-01-30 DIAGNOSIS — N183 Chronic kidney disease, stage 3 unspecified: Secondary | ICD-10-CM

## 2014-01-30 DIAGNOSIS — D631 Anemia in chronic kidney disease: Secondary | ICD-10-CM | POA: Diagnosis not present

## 2014-01-30 DIAGNOSIS — N039 Chronic nephritic syndrome with unspecified morphologic changes: Secondary | ICD-10-CM

## 2014-01-30 DIAGNOSIS — D509 Iron deficiency anemia, unspecified: Secondary | ICD-10-CM | POA: Diagnosis not present

## 2014-01-30 DIAGNOSIS — E611 Iron deficiency: Secondary | ICD-10-CM

## 2014-01-30 DIAGNOSIS — D649 Anemia, unspecified: Secondary | ICD-10-CM

## 2014-01-30 LAB — CBC WITH DIFFERENTIAL/PLATELET
Basophils Absolute: 0 10*3/uL (ref 0.0–0.1)
Basophils Relative: 0 % (ref 0–1)
Eosinophils Absolute: 0 10*3/uL (ref 0.0–0.7)
Eosinophils Relative: 0 % (ref 0–5)
HCT: 33 % — ABNORMAL LOW (ref 36.0–46.0)
Hemoglobin: 10.6 g/dL — ABNORMAL LOW (ref 12.0–15.0)
Lymphocytes Relative: 22 % (ref 12–46)
Lymphs Abs: 1.4 10*3/uL (ref 0.7–4.0)
MCH: 31.5 pg (ref 26.0–34.0)
MCHC: 32.1 g/dL (ref 30.0–36.0)
MCV: 97.9 fL (ref 78.0–100.0)
Monocytes Absolute: 0.4 10*3/uL (ref 0.1–1.0)
Monocytes Relative: 6 % (ref 3–12)
Neutro Abs: 4.4 10*3/uL (ref 1.7–7.7)
Neutrophils Relative %: 72 % (ref 43–77)
Platelets: 189 10*3/uL (ref 150–400)
RBC: 3.37 MIL/uL — ABNORMAL LOW (ref 3.87–5.11)
RDW: 14 % (ref 11.5–15.5)
WBC: 6.1 10*3/uL (ref 4.0–10.5)

## 2014-01-30 LAB — RETICULOCYTES
RBC.: 3.37 MIL/uL — ABNORMAL LOW (ref 3.87–5.11)
Retic Count, Absolute: 70.8 10*3/uL (ref 19.0–186.0)
Retic Ct Pct: 2.1 % (ref 0.4–3.1)

## 2014-01-30 MED ORDER — EPOETIN ALFA 20000 UNIT/ML IJ SOLN
INTRAMUSCULAR | Status: AC
  Filled 2014-01-30: qty 1

## 2014-01-30 MED ORDER — EPOETIN ALFA 20000 UNIT/ML IJ SOLN
20000.0000 [IU] | Freq: Once | INTRAMUSCULAR | Status: AC
  Administered 2014-01-30: 20000 [IU] via SUBCUTANEOUS

## 2014-01-30 NOTE — Progress Notes (Signed)
[name] [name] presents today for injection per MD orders. Procrit 20,000 units administered SQ in right Abdomen. Administration without incident. Patient tolerated well.

## 2014-02-05 ENCOUNTER — Encounter: Payer: Self-pay | Admitting: Family Medicine

## 2014-02-05 ENCOUNTER — Ambulatory Visit (INDEPENDENT_AMBULATORY_CARE_PROVIDER_SITE_OTHER): Payer: Medicare Other | Admitting: Family Medicine

## 2014-02-05 VITALS — BP 100/62 | HR 86 | Resp 18 | Ht 66.0 in | Wt 233.0 lb

## 2014-02-05 DIAGNOSIS — E039 Hypothyroidism, unspecified: Secondary | ICD-10-CM | POA: Diagnosis not present

## 2014-02-05 DIAGNOSIS — M479 Spondylosis, unspecified: Secondary | ICD-10-CM

## 2014-02-05 DIAGNOSIS — K8020 Calculus of gallbladder without cholecystitis without obstruction: Secondary | ICD-10-CM

## 2014-02-05 DIAGNOSIS — E1129 Type 2 diabetes mellitus with other diabetic kidney complication: Secondary | ICD-10-CM

## 2014-02-05 DIAGNOSIS — I10 Essential (primary) hypertension: Secondary | ICD-10-CM

## 2014-02-05 DIAGNOSIS — E785 Hyperlipidemia, unspecified: Secondary | ICD-10-CM

## 2014-02-05 DIAGNOSIS — I5022 Chronic systolic (congestive) heart failure: Secondary | ICD-10-CM

## 2014-02-05 DIAGNOSIS — Z13820 Encounter for screening for osteoporosis: Secondary | ICD-10-CM

## 2014-02-05 DIAGNOSIS — J449 Chronic obstructive pulmonary disease, unspecified: Secondary | ICD-10-CM

## 2014-02-05 DIAGNOSIS — D249 Benign neoplasm of unspecified breast: Secondary | ICD-10-CM

## 2014-02-05 DIAGNOSIS — E669 Obesity, unspecified: Secondary | ICD-10-CM

## 2014-02-05 NOTE — Patient Instructions (Addendum)
F/u in 4 month, call if you need me before  Labs are great, no changes in meds  You are referred for dexa, foot exam today is good  Non fast lipid, chem 7 and EGFr, hBA1C and tSH in 4 month  Wax softener is OTC, ear exam is normal, q tips are dangerous!

## 2014-02-06 ENCOUNTER — Encounter (HOSPITAL_COMMUNITY): Payer: Medicare Other

## 2014-02-06 ENCOUNTER — Encounter (HOSPITAL_BASED_OUTPATIENT_CLINIC_OR_DEPARTMENT_OTHER): Payer: Medicare Other

## 2014-02-06 DIAGNOSIS — D638 Anemia in other chronic diseases classified elsewhere: Secondary | ICD-10-CM | POA: Diagnosis not present

## 2014-02-06 DIAGNOSIS — D649 Anemia, unspecified: Secondary | ICD-10-CM

## 2014-02-06 DIAGNOSIS — D594 Other nonautoimmune hemolytic anemias: Secondary | ICD-10-CM

## 2014-02-06 DIAGNOSIS — E611 Iron deficiency: Secondary | ICD-10-CM

## 2014-02-06 LAB — CBC WITH DIFFERENTIAL/PLATELET
Basophils Absolute: 0 10*3/uL (ref 0.0–0.1)
Basophils Relative: 0 % (ref 0–1)
Eosinophils Absolute: 0 10*3/uL (ref 0.0–0.7)
Eosinophils Relative: 0 % (ref 0–5)
HCT: 32.9 % — ABNORMAL LOW (ref 36.0–46.0)
Hemoglobin: 10.6 g/dL — ABNORMAL LOW (ref 12.0–15.0)
Lymphocytes Relative: 19 % (ref 12–46)
Lymphs Abs: 1.2 10*3/uL (ref 0.7–4.0)
MCH: 31.5 pg (ref 26.0–34.0)
MCHC: 32.2 g/dL (ref 30.0–36.0)
MCV: 97.9 fL (ref 78.0–100.0)
Monocytes Absolute: 0.5 10*3/uL (ref 0.1–1.0)
Monocytes Relative: 8 % (ref 3–12)
Neutro Abs: 4.3 10*3/uL (ref 1.7–7.7)
Neutrophils Relative %: 73 % (ref 43–77)
Platelets: 247 10*3/uL (ref 150–400)
RBC: 3.36 MIL/uL — ABNORMAL LOW (ref 3.87–5.11)
RDW: 14.7 % (ref 11.5–15.5)
WBC: 5.9 10*3/uL (ref 4.0–10.5)

## 2014-02-06 MED ORDER — EPOETIN ALFA 20000 UNIT/ML IJ SOLN
20000.0000 [IU] | Freq: Once | INTRAMUSCULAR | Status: AC
  Administered 2014-02-06: 20000 [IU] via SUBCUTANEOUS

## 2014-02-06 MED ORDER — EPOETIN ALFA 20000 UNIT/ML IJ SOLN
INTRAMUSCULAR | Status: AC
  Filled 2014-02-06: qty 1

## 2014-02-06 NOTE — Progress Notes (Signed)
[name] [name] presented for labwork. Labs per MD order drawn via Peripheral Line 23 gauge needle inserted in left antecubital.  Good blood return present. Procedure without incident.  Needle removed intact. Patient tolerated procedure well.  [name] presents today for injection per MD orders. Procrit 20,000 units administered SQ in left lower abdomen. Administration without incident. Patient tolerated well.

## 2014-02-07 LAB — MICROALBUMIN / CREATININE URINE RATIO
Creatinine, Urine: 72.1 mg/dL
Microalb Creat Ratio: 9.8 mg/g (ref 0.0–30.0)
Microalb, Ur: 0.71 mg/dL (ref 0.00–1.89)

## 2014-02-08 NOTE — Assessment & Plan Note (Signed)
Stable no med change

## 2014-02-08 NOTE — Assessment & Plan Note (Signed)
Controlled on multiple meds, systolic pressure kept as low as tolerated due to severe heart failure, management per cardiology

## 2014-02-08 NOTE — Assessment & Plan Note (Signed)
Controlled, no change in medication  

## 2014-02-08 NOTE — Progress Notes (Signed)
   Subjective:    Patient ID: [name] [name], female    DOB: 1931/12/26, 80 y.o.   MRN: [id number]  HPI The PT is here for follow up and re-evaluation of chronic medical conditions, medication management and review of any available recent lab and radiology data.  Preventive health is updated, specifically  Cancer screening and Immunization.   Questions or concerns regarding consultations or procedures which the PT has had in the interim are  addressed. The PT denies any adverse reactions to current medications since the last visit.  There are no new concerns.  There are no specific complaints       Review of Systems See HPI Denies recent fever or chills. Denies sinus pressure, uncontrolled  nasal congestion, ear pain or sore throat. Denies chest congestion, productive cough or wheezing. Denies chest pains, palpitations and leg swelling Denies abdominal pain, nausea, vomiting,diarrhea or constipation.   Denies dysuria, frequency, hesitancy or incontinence. Chronic joint pain, swelling and limitation in mobility. Denies headaches, seizures, numbness, or tingling. Denies depression, anxiety or insomnia. Denies skin break down or rash.        Objective:   Physical Exam  BP 100/62  Pulse 86  Resp 18  Ht 5\' 6"  (1.676 m)  Wt 233 lb (105.688 kg)  BMI 37.63 kg/m2  SpO2 90% Patient alert and oriented and in no cardiopulmonary distress.  HEENT: No facial asymmetry, EOMI, no sinus tenderness,  oropharynx pink and moist.  Neck supple no adenopathy.no JVD  Chest: Clear to auscultation bilaterally.  CVS: S1, S2  Clicks and systolic murmur, no S3.  ABD: Soft non tender. Bowel sounds normal.  Ext: No edema  MS: decreased  ROM spine,  hips and knees.  Skin: Intact, no ulcerations or rash noted.  Psych: Good eye contact, normal affect. Memory intact not anxious or depressed appearing.  CNS: CN 2-12 intact, power, tone and sensation normal throughout.       Assessment &  Plan:  Type II or unspecified type diabetes mellitus with renal manifestations, not stated as uncontrolled(250.40) Diet controlled and improved, pt applauded on this and will continue with this management Patient advised to reduce carb and sweets, commit to regular physical activity, take meds as prescribed, test blood as directed, and attempt to lose weight, to improve blood sugar control.   HYPOTHYROIDISM Controlled, no change in medication   HYPERTENSION - with Cardiac Abnormalities Controlled on multiple meds, systolic pressure kept as low as tolerated due to severe heart failure, management per cardiology  Chronic systolic heart failure Stable, no symptoms of decompensation at this time  CHOLELITHIASIS Stable no acute flare, risk of surgery outweighs benefit  COPD (chronic obstructive pulmonary disease) Stable no med change  DEGENERATIVE JOINT DISEASE, SPINE Chronic pain, treated by ortho  Papilloma of breast Followed by breast surgeon, pt is at high risk as far as surgery is concerned, no indication for excision to date   OBESITY Deteriorated. Patient re-educated about  the importance of commitment to a  minimum of 150 minutes of exercise per week. The importance of healthy food choices with portion control discussed. Encouraged to start a food diary, count calories and to consider  joining a support group. Sample diet sheets offered. Goals set by the patient for the next several months.

## 2014-02-08 NOTE — Assessment & Plan Note (Signed)
Followed by breast surgeon, pt is at high risk as far as surgery is concerned, no indication for excision to date

## 2014-02-08 NOTE — Assessment & Plan Note (Signed)
Deteriorated. Patient re-educated about  the importance of commitment to a  minimum of 150 minutes of exercise per week. The importance of healthy food choices with portion control discussed. Encouraged to start a food diary, count calories and to consider  joining a support group. Sample diet sheets offered. Goals set by the patient for the next several months.    

## 2014-02-08 NOTE — Assessment & Plan Note (Signed)
Controlled, no change in medication Hyperlipidemia:Low fat diet discussed and encouraged.  \ 

## 2014-02-08 NOTE — Assessment & Plan Note (Signed)
Stable, no symptoms of decompensation at this time

## 2014-02-08 NOTE — Assessment & Plan Note (Signed)
Stable no acute flare, risk of surgery outweighs benefit

## 2014-02-08 NOTE — Assessment & Plan Note (Signed)
Chronic pain, treated by ortho

## 2014-02-08 NOTE — Assessment & Plan Note (Signed)
Diet controlled and improved, pt applauded on this and will continue with this management Patient advised to reduce carb and sweets, commit to regular physical activity, take meds as prescribed, test blood as directed, and attempt to lose weight, to improve blood sugar control.

## 2014-02-09 ENCOUNTER — Ambulatory Visit (INDEPENDENT_AMBULATORY_CARE_PROVIDER_SITE_OTHER): Payer: Medicare Other | Admitting: *Deleted

## 2014-02-09 DIAGNOSIS — Z954 Presence of other heart-valve replacement: Secondary | ICD-10-CM | POA: Diagnosis not present

## 2014-02-09 DIAGNOSIS — Z7901 Long term (current) use of anticoagulants: Secondary | ICD-10-CM | POA: Diagnosis not present

## 2014-02-09 DIAGNOSIS — Z5181 Encounter for therapeutic drug level monitoring: Secondary | ICD-10-CM

## 2014-02-09 DIAGNOSIS — Z952 Presence of prosthetic heart valve: Secondary | ICD-10-CM

## 2014-02-09 LAB — POCT INR: INR: 4.8

## 2014-02-10 ENCOUNTER — Ambulatory Visit (HOSPITAL_COMMUNITY)
Admission: RE | Admit: 2014-02-10 | Discharge: 2014-02-10 | Disposition: A | Discharge status: Home / Self Care | Payer: Medicare Other | Source: Ambulatory Visit | Attending: Family Medicine | Admitting: Family Medicine

## 2014-02-10 ENCOUNTER — Encounter: Payer: Self-pay | Admitting: Family Medicine

## 2014-02-10 DIAGNOSIS — Z780 Asymptomatic menopausal state: Secondary | ICD-10-CM | POA: Diagnosis not present

## 2014-02-10 DIAGNOSIS — M8580 Other specified disorders of bone density and structure, unspecified site: Secondary | ICD-10-CM | POA: Insufficient documentation

## 2014-02-10 DIAGNOSIS — Z13820 Encounter for screening for osteoporosis: Secondary | ICD-10-CM | POA: Diagnosis not present

## 2014-02-10 DIAGNOSIS — M899 Disorder of bone, unspecified: Secondary | ICD-10-CM | POA: Diagnosis not present

## 2014-02-10 DIAGNOSIS — M949 Disorder of cartilage, unspecified: Secondary | ICD-10-CM | POA: Diagnosis not present

## 2014-02-11 ENCOUNTER — Other Ambulatory Visit: Payer: Self-pay | Admitting: Family Medicine

## 2014-02-11 DIAGNOSIS — Z09 Encounter for follow-up examination after completed treatment for conditions other than malignant neoplasm: Secondary | ICD-10-CM

## 2014-02-11 DIAGNOSIS — D242 Benign neoplasm of left breast: Secondary | ICD-10-CM

## 2014-02-12 ENCOUNTER — Encounter: Payer: Self-pay | Admitting: Family Medicine

## 2014-02-12 DIAGNOSIS — M25569 Pain in unspecified knee: Secondary | ICD-10-CM | POA: Diagnosis not present

## 2014-02-12 DIAGNOSIS — M25519 Pain in unspecified shoulder: Secondary | ICD-10-CM | POA: Diagnosis not present

## 2014-02-13 ENCOUNTER — Encounter (HOSPITAL_BASED_OUTPATIENT_CLINIC_OR_DEPARTMENT_OTHER): Payer: Medicare Other

## 2014-02-13 ENCOUNTER — Other Ambulatory Visit (HOSPITAL_COMMUNITY): Payer: Self-pay | Admitting: Hematology and Oncology

## 2014-02-13 ENCOUNTER — Encounter (HOSPITAL_COMMUNITY): Payer: Self-pay

## 2014-02-13 VITALS — BP 132/73 | HR 73 | Temp 98.0°F | Resp 20 | Wt 234.9 lb

## 2014-02-13 DIAGNOSIS — N189 Chronic kidney disease, unspecified: Secondary | ICD-10-CM | POA: Diagnosis not present

## 2014-02-13 DIAGNOSIS — D594 Other nonautoimmune hemolytic anemias: Secondary | ICD-10-CM | POA: Diagnosis not present

## 2014-02-13 DIAGNOSIS — D509 Iron deficiency anemia, unspecified: Secondary | ICD-10-CM

## 2014-02-13 DIAGNOSIS — E611 Iron deficiency: Secondary | ICD-10-CM

## 2014-02-13 DIAGNOSIS — D631 Anemia in chronic kidney disease: Secondary | ICD-10-CM | POA: Diagnosis not present

## 2014-02-13 DIAGNOSIS — D638 Anemia in other chronic diseases classified elsewhere: Secondary | ICD-10-CM

## 2014-02-13 DIAGNOSIS — N039 Chronic nephritic syndrome with unspecified morphologic changes: Secondary | ICD-10-CM

## 2014-02-13 LAB — CBC WITH DIFFERENTIAL/PLATELET
Basophils Absolute: 0 10*3/uL (ref 0.0–0.1)
Basophils Relative: 0 % (ref 0–1)
Eosinophils Absolute: 0 10*3/uL (ref 0.0–0.7)
Eosinophils Relative: 0 % (ref 0–5)
HCT: 31.7 % — ABNORMAL LOW (ref 36.0–46.0)
Hemoglobin: 10.2 g/dL — ABNORMAL LOW (ref 12.0–15.0)
Lymphocytes Relative: 9 % — ABNORMAL LOW (ref 12–46)
Lymphs Abs: 0.8 10*3/uL (ref 0.7–4.0)
MCH: 31.1 pg (ref 26.0–34.0)
MCHC: 32.2 g/dL (ref 30.0–36.0)
MCV: 96.6 fL (ref 78.0–100.0)
Monocytes Absolute: 0.9 10*3/uL (ref 0.1–1.0)
Monocytes Relative: 10 % (ref 3–12)
Neutro Abs: 7.5 10*3/uL (ref 1.7–7.7)
Neutrophils Relative %: 81 % — ABNORMAL HIGH (ref 43–77)
Platelets: 274 10*3/uL (ref 150–400)
RBC: 3.28 MIL/uL — ABNORMAL LOW (ref 3.87–5.11)
RDW: 14.8 % (ref 11.5–15.5)
WBC: 9.2 10*3/uL (ref 4.0–10.5)

## 2014-02-13 MED ORDER — EPOETIN ALFA 40000 UNIT/ML IJ SOLN
INTRAMUSCULAR | Status: AC
  Filled 2014-02-13: qty 1

## 2014-02-13 MED ORDER — EPOETIN ALFA 40000 UNIT/ML IJ SOLN
40000.0000 [IU] | Freq: Once | INTRAMUSCULAR | Status: AC
  Administered 2014-02-13: 40000 [IU] via SUBCUTANEOUS

## 2014-02-13 NOTE — Patient Instructions (Signed)
[facility] Discharge Instructions  RECOMMENDATIONS MADE BY THE CONSULTANT AND ANY TEST RESULTS WILL BE SENT TO YOUR REFERRING PHYSICIAN.  EXAM FINDINGS BY THE PHYSICIAN TODAY AND SIGNS OR SYMPTOMS TO REPORT TO CLINIC OR PRIMARY PHYSICIAN: Exam and findings as discussed by Dr. Barnet Glasgow.  Will increase your procrit dosage to 40,000 units every 3 weeks.  Report increased shortness of breath, fatigue, etc.  MEDICATIONS PRESCRIBED:  none  INSTRUCTIONS/FOLLOW-UP: Follow-up in 3 weeks with labs, office visit and possible injection.  Thank you for choosing [facility] to provide your oncology and hematology care.  To afford each patient quality time with our providers, please arrive at least 15 minutes before your scheduled appointment time.  With your help, our goal is to use those 15 minutes to complete the necessary work-up to ensure our physicians have the information they need to help with your evaluation and healthcare recommendations.    Effective January 1st, 2014, we ask that you re-schedule your appointment with our physicians should you arrive 10 or more minutes late for your appointment.  We strive to give you quality time with our providers, and arriving late affects you and other patients whose appointments are after yours.    Again, thank you for choosing [facility].  Our hope is that these requests will decrease the amount of time that you wait before being seen by our physicians.       _____________________________________________________________  Should you have questions after your visit to [facility], please contact our office at [phone] between the hours of 8:30 a.m. and 5:00 p.m.  Voicemails left after 4:30 p.m. will not be returned until the following business day.  For prescription refill requests, have your pharmacy contact our office with your prescription refill request.

## 2014-02-13 NOTE — Progress Notes (Signed)
[name] [name] presented for labwork. Labs per MD order drawn via Peripheral Line 23 gauge needle inserted in right AC  Good blood return present. Procedure without incident.  Needle removed intact. Patient tolerated procedure well.  [name] presents today for injection per MD orders. Procrit 40,000 administered SQ in right Abdomen. Administration without incident. Patient tolerated well.

## 2014-02-13 NOTE — Progress Notes (Signed)
[place]  OFFICE PROGRESS NOTE  [name] Nakayama, MD [address], [address] [place] [address]  DIAGNOSIS: No diagnosis found.  Chief Complaint  Patient presents with  . 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  INTERVAL HISTORY: [name] [name] 80 y.o. female returns for followup of microangiopathic hemolytic anemia receiving intravenous iron periodically along with erythropoietin when appropriate, lately receiving 20,000 units weekly with hemoglobin between 10.5 and 11.2. Last serum ferritin was 1788. She still feels short of breath on exertion and is using a cane. She's had increasing lower extremity swelling without PND, orthopnea, or palpitations. She continues to you the clicking sound of her heart valves. She denies any fever, night sweats, sore throat, diarrhea, constipation, dysuria, hematuria, skin rash, headache, or seizures. She also denies any epistaxis, hematuria, or vaginal bleeding.  MEDICAL HISTORY: Past Medical History  Diagnosis Date  . DJD (degenerative joint disease) of lumbar spine   . Chronic kidney disease, stage I   . Esophageal reflux   . Other and unspecified hyperlipidemia   . Other primary cardiomyopathies   . Obesity, unspecified   . Unspecified hypothyroidism   . DM (diabetes mellitus)   . Essential hypertension, benign   . 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 Pain in joint, pelvic region and thigh   . Insomnia, unspecified   . Iron deficiency 10/04/2011  . IDA (iron deficiency anemia)     Parenteral iron/[name]  . CHF (congestive heart failure)   . COPD (chronic obstructive pulmonary disease)   . Heart murmur   . ICD (implantable cardiac defibrillator) in place   . Adenomatous polyp of  colon 12/16/2002    Dr. [name]/[facility]  . Hyperlipemia   . Hemolysis 09/24/2012  . 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, not stated as uncontrolled(250.40); HYPERLIPIDEMIA; OBESITY; HYPERTENSION - with Cardiac Abnormalities; Non-ischemic cardiomyopathy with ICD; VARICOSE VEINS LOWER EXTREMITIES W/OTH COMPS; GERD; CHOLELITHIASIS; HIP PAIN, RIGHT; DEGENERATIVE JOINT DISEASE, SPINE; NAUSEA; Allergic rhinitis; Low back pain; Iron deficiency; Papilloma of breast; Warfarin-induced coagulopathy; Cardiorenal syndrome with renal failure; S/P AVR (aortic valve replacement); S/P MVR (mitral valve replacement); AICD (automatic cardioverter/defibrillator) present; CKD (chronic kidney disease), stage III - due to DM, HTN and cardiorenal syndrome; Chronic anticoagulation, on coumadin for Mechanical AVR and MVR; Upper leg pain, rt. from use of BSC?  ; COPD (chronic obstructive pulmonary disease); Chronic systolic heart failure; H/O adenomatous polyp of colon; High risk medication use; Routine general medical examination at a health care facility; Hemolysis; Spinal stenosis of lumbar region; OA (osteoarthritis) of knee; Rotator cuff syndrome of left shoulder; Other malaise and fatigue; Encounter for therapeutic drug monitoring; and Osteopenia on her problem list.    ALLERGIES:  is allergic to aspirin; niacin; and penicillins.  MEDICATIONS: has a current medication list which includes the following prescription(s): acetaminophen, albuterol, allopurinol, choline fenofibrate, digoxin, docusate sodium, ezetimibe-simvastatin, fluticasone, fluticasone-salmeterol, folic acid, furosemide, gabapentin, hydrocodone-acetaminophen, isosorbide-hydralazine, levothyroxine, losartan, meclizine, metolazone, metoprolol succinate, centrum silver adult 50+, ondansetron, polyethylene glycol powder, potassium chloride sa,  promethazine, temazepam, warfarin, and ester-c.  SURGICAL HISTORY:  Past Surgical History  Procedure Laterality Date  . Thyroidectomy    . Pacemaker placed  2004  . Aortic  and mitral valve replacement  2001  . Right breast cyst removed      benign   . Right cataract removed      2005  . Defibrillator implanted 2006    . Cardiac valve replacement    . Cardiac catheterization    . Insert / replace / remove pacemaker    . Esophagogastroduodenoscopy  06/12/2007    Rourk- normal esophagus, small hiatal hernia, otherwise normal stomach, D1, D2  . Colonoscopy  06/12/2007    Rourk- Normal rectum/Normal colon  . Colonoscopy  12/16/02    small hemorrhoids  . Colonoscopy  06/20/2012    Procedure: COLONOSCOPY;  Surgeon: Daneil Dolin, MD;  Location: [facility];  Service: Endoscopy;  Laterality: N/A;  10:45    FAMILY HISTORY: family history includes Cancer in her mother; Diabetes in her brother; Heart disease in her brother, father, and sister.  SOCIAL HISTORY:  reports that she quit smoking about 26 years ago. Her smoking use included Cigarettes. She has a 30 pack-year smoking history. She has never used smokeless tobacco. She reports that she does not drink alcohol or use illicit drugs.  REVIEW OF SYSTEMS:  Other than that discussed above is noncontributory.  PHYSICAL EXAMINATION: ECOG PERFORMANCE STATUS: 1 - Symptomatic but completely ambulatory  Blood pressure 132/73, pulse 73, temperature 98 F (36.7 C), resp. rate 20, weight 234 lb 14.4 oz (106.55 kg).  GENERAL:alert, no distress and comfortable SKIN: skin color, texture, turgor are normal, no rashes or significant lesions EYES: PERLA; Conjunctiva are pink and non-injected, sclera clear SINUSES: No redness or tenderness over maxillary or ethmoid sinuses OROPHARYNX:no exudate, no erythema on lips, buccal mucosa, or tongue. NECK: supple, thyroid normal size, non-tender, without nodularity. No masses CHEST: Normal AP diameter with  no breast masses. LYMPH:  no palpable lymphadenopathy in the cervical, axillary or inguinal LUNGS: clear to auscultation and percussion with normal breathing effort HEART: regular rate & rhythm. Artificial valve click heard with no diastolic murmur. ABDOMEN:abdomen soft, non-tender and normal bowel sounds MUSCULOSKELETAL:no cyanosis of digits and no clubbing. Range of motion normal. +1 lower 70 edema bilaterally. NEURO: alert & oriented x 3 with fluent speech, no focal motor/sensory deficits   LABORATORY DATA:   Results for [name] [name], [name] [name] (MRN [id number]) as of 02/13/2014 13:03  Ref. Range 01/09/2014 11:46 01/15/2014 10:44 01/23/2014 13:15 01/30/2014 12:55 02/06/2014 12:00  Hemoglobin Latest Range: 12.0-15.0 g/dL 11.6 (L) 11.7 (L) 11.0 (L) 10.6 (L) 10.6 (L)       Appointment on 02/13/2014  Component Date Value Ref Range Status  . WBC 02/13/2014 9.2  4.0 - 10.5 K/uL Final  . RBC 02/13/2014 3.28* 3.87 - 5.11 MIL/uL Final  . Hemoglobin 02/13/2014 10.2* 12.0 - 15.0 g/dL Final  . HCT 02/13/2014 31.7* 36.0 - 46.0 % Final  . MCV 02/13/2014 96.6  78.0 - 100.0 fL Final  . MCH 02/13/2014 31.1  26.0 - 34.0 pg Final  . MCHC 02/13/2014 32.2  30.0 - 36.0 g/dL Final  . RDW 02/13/2014 14.8  11.5 - 15.5 % Final  . Platelets 02/13/2014 274  150 - 400 K/uL Final  . Neutrophils Relative % 02/13/2014 81* 43 - 77 % Final  . Neutro Abs 02/13/2014 7.5  1.7 - 7.7 K/uL Final  . Lymphocytes Relative 02/13/2014 9* 12 - 46 % Final  . Lymphs Abs 02/13/2014 0.8  0.7 - 4.0 K/uL Final  . Monocytes Relative 02/13/2014 10  3 - 12 % Final  . Monocytes Absolute 02/13/2014 0.9  0.1 - 1.0 K/uL Final  . Eosinophils Relative 02/13/2014 0  0 - 5 % Final  . Eosinophils Absolute 02/13/2014 0.0  0.0 - 0.7 K/uL Final  . Basophils Relative 02/13/2014 0  0 - 1 % Final  . Basophils Absolute 02/13/2014 0.0  0.0 - 0.1 K/uL Final  Anti-coag visit on 02/09/2014  Component Date Value Ref Range Status  . INR 02/09/2014 4.8   Final    Infusion on 02/06/2014  Component Date Value Ref Range Status  . WBC 02/06/2014 5.9  4.0 - 10.5 K/uL Final  . RBC 02/06/2014 3.36* 3.87 - 5.11 MIL/uL Final  . Hemoglobin 02/06/2014 10.6* 12.0 - 15.0 g/dL Final  . HCT 02/06/2014 32.9* 36.0 - 46.0 % Final  . MCV 02/06/2014 97.9  78.0 - 100.0 fL Final  . MCH 02/06/2014 31.5  26.0 - 34.0 pg Final  . MCHC 02/06/2014 32.2  30.0 - 36.0 g/dL Final  . RDW 02/06/2014 14.7  11.5 - 15.5 % Final  . Platelets 02/06/2014 247  150 - 400 K/uL Final  . Neutrophils Relative % 02/06/2014 73  43 - 77 % Final  . Neutro Abs 02/06/2014 4.3  1.7 - 7.7 K/uL Final  . Lymphocytes Relative 02/06/2014 19  12 - 46 % Final  . Lymphs Abs 02/06/2014 1.2  0.7 - 4.0 K/uL Final  . Monocytes Relative 02/06/2014 8  3 - 12 % Final  . Monocytes Absolute 02/06/2014 0.5  0.1 - 1.0 K/uL Final  . Eosinophils Relative 02/06/2014 0  0 - 5 % Final  . Eosinophils Absolute 02/06/2014 0.0  0.0 - 0.7 K/uL Final  . Basophils Relative 02/06/2014 0  0 - 1 % Final  . Basophils Absolute 02/06/2014 0.0  0.0 - 0.1 K/uL Final  Office Visit on 02/05/2014  Component Date Value Ref Range Status  . Microalb, Ur 02/05/2014 0.71  0.00 - 1.89 mg/dL Final  . Creatinine, Urine 02/05/2014 72.1   Final  . Microalb Creat Ratio 02/05/2014 9.8  0.0 - 30.0 mg/g Final  Appointment on 01/30/2014  Component Date Value Ref Range Status  . WBC 01/30/2014 6.1  4.0 - 10.5 K/uL Final  . RBC 01/30/2014 3.37* 3.87 - 5.11 MIL/uL Final  . Hemoglobin 01/30/2014 10.6* 12.0 - 15.0 g/dL Final  . HCT 01/30/2014 33.0* 36.0 - 46.0 % Final  . MCV 01/30/2014 97.9  78.0 - 100.0 fL Final  . MCH 01/30/2014 31.5  26.0 - 34.0 pg Final  . MCHC 01/30/2014 32.1  30.0 - 36.0 g/dL Final  . RDW 01/30/2014 14.0  11.5 - 15.5 % Final  . Platelets 01/30/2014 189  150 - 400 K/uL Final  . Neutrophils Relative % 01/30/2014 72  43 - 77 % Final  . Neutro Abs 01/30/2014 4.4  1.7 - 7.7 K/uL Final  . Lymphocytes Relative 01/30/2014 22  12 -  46 % Final  . Lymphs Abs 01/30/2014 1.4  0.7 - 4.0 K/uL Final  . Monocytes Relative 01/30/2014 6  3 - 12 % Final  . Monocytes Absolute 01/30/2014 0.4  0.1 - 1.0 K/uL Final  . Eosinophils Relative 01/30/2014 0  0 - 5 % Final  . Eosinophils Absolute 01/30/2014 0.0  0.0 - 0.7 K/uL Final  . Basophils Relative 01/30/2014 0  0 - 1 % Final  . Basophils Absolute 01/30/2014 0.0  0.0 - 0.1 K/uL Final  . Retic Ct Pct 01/30/2014 2.1  0.4 - 3.1 % Final  . RBC. 01/30/2014 3.37* 3.87 - 5.11 MIL/uL  Final  . Retic Count, Manual 01/30/2014 70.8  19.0 - 186.0 K/uL Final  Anti-coag visit on 01/26/2014  Component Date Value Ref Range Status  . INR 01/26/2014 2.3   Final  Appointment on 01/23/2014  Component Date Value Ref Range Status  . WBC 01/23/2014 7.3  4.0 - 10.5 K/uL Final  . RBC 01/23/2014 3.53* 3.87 - 5.11 MIL/uL Final  . Hemoglobin 01/23/2014 11.0* 12.0 - 15.0 g/dL Final  . HCT 01/23/2014 34.3* 36.0 - 46.0 % Final  . MCV 01/23/2014 97.2  78.0 - 100.0 fL Final  . MCH 01/23/2014 31.2  26.0 - 34.0 pg Final  . MCHC 01/23/2014 32.1  30.0 - 36.0 g/dL Final  . RDW 01/23/2014 14.2  11.5 - 15.5 % Final  . Platelets 01/23/2014 208  150 - 400 K/uL Final  . Neutrophils Relative % 01/23/2014 77  43 - 77 % Final  . Neutro Abs 01/23/2014 5.6  1.7 - 7.7 K/uL Final  . Lymphocytes Relative 01/23/2014 15  12 - 46 % Final  . Lymphs Abs 01/23/2014 1.1  0.7 - 4.0 K/uL Final  . Monocytes Relative 01/23/2014 8  3 - 12 % Final  . Monocytes Absolute 01/23/2014 0.6  0.1 - 1.0 K/uL Final  . Eosinophils Relative 01/23/2014 0  0 - 5 % Final  . Eosinophils Absolute 01/23/2014 0.0  0.0 - 0.7 K/uL Final  . Basophils Relative 01/23/2014 0  0 - 1 % Final  . Basophils Absolute 01/23/2014 0.0  0.0 - 0.1 K/uL Final  Anti-coag visit on 01/19/2014  Component Date Value Ref Range Status  . INR 01/19/2014 5.9   Final  Telephone on 01/19/2014  Component Date Value Ref Range Status  . Cholesterol 01/23/2014 129  0 - 200 mg/dL Final    Comment: ATP III Classification:                                < 200        mg/dL        Desirable                               200 - 239     mg/dL        Borderline High                               >= 240        mg/dL        High                             . Triglycerides 01/23/2014 103  <150 mg/dL Final  . HDL 01/23/2014 43  >39 mg/dL Final  . Total CHOL/HDL Ratio 01/23/2014 3.0   Final  . VLDL 01/23/2014 21  0 - 40 mg/dL Final  . LDL Cholesterol 01/23/2014 65  0 - 99 mg/dL Final   Comment:                            Total Cholesterol/HDL Ratio:CHD Risk  Coronary Heart Disease Risk Table                                                                 Men       Women                                   1/2 Average Risk              3.4        3.3                                       Average Risk              5.0        4.4                                    2X Average Risk              9.6        7.1                                    3X Average Risk             23.4       11.0                          Use the calculated Patient Ratio above and the CHD Risk table                           to determine the patient's CHD Risk.                          ATP III Classification (LDL):                                < 100        mg/dL         Optimal                               100 - 129     mg/dL         Near or Above Optimal                               130 - 159     mg/dL         Borderline High                               160 - 189     mg/dL           High                                > 190        mg/dL         Very High                             . Sodium 01/23/2014 140  135 - 145 mEq/L Final  . Potassium 01/23/2014 4.2  3.5 - 5.3 mEq/L Final  . Chloride 01/23/2014 102  96 - 112 mEq/L Final  . CO2 01/23/2014 28  19 - 32 mEq/L Final  . Glucose, Bld 01/23/2014 98  70 - 99 mg/dL Final  . BUN 01/23/2014 62* 6 - 23 mg/dL Final  .  Creat 01/23/2014 2.17* 0.50 - 1.10 mg/dL Final  . Total Bilirubin 01/23/2014 0.6  0.2 - 1.2 mg/dL Final  . Alkaline Phosphatase 01/23/2014 28* 39 - 117 U/L Final  . AST 01/23/2014 26  0 - 37 U/L Final  . ALT 01/23/2014 13  0 - 35 U/L Final  . Total Protein 01/23/2014 7.9  6.0 - 8.3 g/dL Final  . Albumin 01/23/2014 4.3  3.5 - 5.2 g/dL Final  . Calcium 01/23/2014 9.9  8.4 - 10.5 mg/dL Final  . GFR, Est African American 01/23/2014 24*  Final  . GFR, Est Non African American 01/23/2014 21*  Final   Comment:                            The estimated GFR is a calculation valid for adults (>=31 years old)                          that uses the CKD-EPI algorithm to adjust for age and sex. It is                            not to be used for children, pregnant women, hospitalized patients,                             patients on dialysis, or with rapidly changing kidney function.                          According to the NKDEP, eGFR >89 is normal, 60-89 shows mild                          impairment, 30-59 shows moderate impairment, 15-29 shows severe                          impairment and <15 is ESRD.                             Marland Kitchen Hemoglobin A1C 01/23/2014 6.2* <5.7 % Final   Comment:  According to the ADA Clinical Practice Recommendations for 2011, when                          HbA1c is used as a screening test:                                                       >=6.5%   Diagnostic of Diabetes Mellitus                                     (if abnormal result is confirmed)                                                     5.7-6.4%   Increased risk of developing Diabetes Mellitus                                                     References:Diagnosis and Classification of Diabetes Mellitus,Diabetes                          FOYD,7412,87(OMVEH 1):S62-S69 and Standards of Medical Care in                                   Diabetes - 2011,Diabetes MCNO,7096,28 (Suppl 1):S11-S61.                             . Mean Plasma Glucose 01/23/2014 131* <117 mg/dL Final  . Vit D, 25-Hydroxy 01/23/2014 49  30 - 89 ng/mL Final   Comment: This assay accurately quantifies Vitamin D, which is the sum of the                          25-Hydroxy forms of Vitamin D2 and D3.  Studies have shown that the                          optimum concentration of 25-Hydroxy Vitamin D is 30 ng/mL or higher.                           Concentrations of Vitamin D between 20 and 29 ng/mL are considered to                          be insufficient and concentrations less than 20 ng/mL are considered                          to be deficient for Vitamin D.  Infusion on 01/15/2014  Component Date Value Ref Range Status  . WBC 01/15/2014 8.7  4.0 - 10.5 K/uL  Final  . RBC 01/15/2014 3.66* 3.87 - 5.11 MIL/uL Final  . Hemoglobin 01/15/2014 11.7* 12.0 - 15.0 g/dL Final  . HCT 01/15/2014 35.1* 36.0 - 46.0 % Final  . MCV 01/15/2014 95.9  78.0 - 100.0 fL Final  . MCH 01/15/2014 32.0  26.0 - 34.0 pg Final  . MCHC 01/15/2014 33.3  30.0 - 36.0 g/dL Final  . RDW 01/15/2014 14.7  11.5 - 15.5 % Final  . Platelets 01/15/2014 230  150 - 400 K/uL Final  . Neutrophils Relative % 01/15/2014 71  43 - 77 % Final  . Neutro Abs 01/15/2014 6.1  1.7 - 7.7 K/uL Final  . Lymphocytes Relative 01/15/2014 21  12 - 46 % Final  . Lymphs Abs 01/15/2014 1.8  0.7 - 4.0 K/uL Final  . Monocytes Relative 01/15/2014 8  3 - 12 % Final  . Monocytes Absolute 01/15/2014 0.7  0.1 - 1.0 K/uL Final  . Eosinophils Relative 01/15/2014 0  0 - 5 % Final  . Eosinophils Absolute 01/15/2014 0.0  0.0 - 0.7 K/uL Final  . Basophils Relative 01/15/2014 0  0 - 1 % Final  . Basophils Absolute 01/15/2014 0.0  0.0 - 0.1 K/uL Final  There may be more visits with results that are not included.  PATHOLOGY: No new pathology.  Urinalysis    Component Value Date/Time   COLORURINE  YELLOW 03/27/2012 1201   APPEARANCEUR CLEAR 03/27/2012 1201   LABSPEC 1.018 03/27/2012 1201   PHURINE 5.5 03/27/2012 1201   GLUCOSEU NEGATIVE 03/27/2012 1201   GLUCOSEU NEG mg/dL 03/22/2010 2313   HGBUR NEGATIVE 03/27/2012 1201   HGBUR negative 05/27/2010 1122   BILIRUBINUR neg 03/11/2013 1443   BILIRUBINUR NEGATIVE 03/27/2012 1201   KETONESUR NEGATIVE 03/27/2012 1201   PROTEINUR neg 03/11/2013 1443   PROTEINUR NEGATIVE 03/27/2012 1201   UROBILINOGEN 0.2 03/11/2013 1443   UROBILINOGEN 0.2 03/27/2012 1201   NITRITE neg 03/11/2013 1443   NITRITE NEGATIVE 03/27/2012 1201   LEUKOCYTESUR Negative 03/11/2013 1443    RADIOGRAPHIC STUDIES: Dg Bone Density  02/10/2014   EXAM: DG DEXA BONE DENSITY STUDY  The Bone Mineral Densitometry hard-copy report (which includes all data, graphical display, and FRAX results when applicable) has been sent directly to the ordering physician.  This report can also be obtained electronically by viewing images for this exam through the performing facility's EMR, or by logging directly into [employer].   Electronically Signed   By: Lavonia Dana M.D.   On: 02/10/2014 14:17    ASSESSMENT:  #1. 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 Procrit is maintain hemoglobin between 10.5 and 11. #2. Status post aortic and mitral valve replacements.  #3. Chronic kidney disease.  #4. Increasing lower extremity edema.    PLAN:  #1. In order to maintain stability of hemoglobin and yet not make as many visits, the patient was given Procrit 40,000 units subcutaneously today to be repeated every 3 weeks. #2. Increase furosemide to 40 mg daily for 3 or 4 days until lower extremity edema has dissipated and then to resume 40 mg daily. #3. Followup in 3 weeks with CBC.   All questions were answered. The patient knows to call the clinic with any problems, questions or  concerns. We can certainly see the patient much sooner if necessary.   I spent 25 minutes counseling the patient face to face. The total time  spent in the appointment was 30 minutes.    Farrel Gobble, MD 02/13/2014 1:49 PM  DISCLAIMER:  This note was dictated with voice recognition software.  Similar sounding words can inadvertently be transcribed inaccurately and may not be corrected upon review.

## 2014-02-13 NOTE — Addendum Note (Signed)
Addended by: Mellissa Kohut on: 02/13/2014 02:24 PM   Modules accepted: Orders

## 2014-02-20 ENCOUNTER — Other Ambulatory Visit (HOSPITAL_COMMUNITY): Payer: Medicare Other

## 2014-02-20 ENCOUNTER — Ambulatory Visit (HOSPITAL_COMMUNITY): Payer: Medicare Other

## 2014-02-20 NOTE — Progress Notes (Signed)
Labs drawn

## 2014-02-23 ENCOUNTER — Ambulatory Visit (INDEPENDENT_AMBULATORY_CARE_PROVIDER_SITE_OTHER): Payer: Medicare Other | Admitting: *Deleted

## 2014-02-23 DIAGNOSIS — Z954 Presence of other heart-valve replacement: Secondary | ICD-10-CM

## 2014-02-23 DIAGNOSIS — Z7901 Long term (current) use of anticoagulants: Secondary | ICD-10-CM | POA: Diagnosis not present

## 2014-02-23 DIAGNOSIS — Z952 Presence of prosthetic heart valve: Secondary | ICD-10-CM

## 2014-02-23 DIAGNOSIS — Z5181 Encounter for therapeutic drug level monitoring: Secondary | ICD-10-CM

## 2014-02-23 LAB — POCT INR: INR: 4

## 2014-02-27 ENCOUNTER — Other Ambulatory Visit: Payer: Self-pay

## 2014-02-27 MED ORDER — FLUTICASONE-SALMETEROL 100-50 MCG/DOSE IN AEPB: 1.0000 | INHALATION_SPRAY | Freq: Two times a day (BID) | RESPIRATORY_TRACT | Status: DC

## 2014-03-02 ENCOUNTER — Telehealth: Payer: Self-pay

## 2014-03-02 ENCOUNTER — Other Ambulatory Visit: Payer: Self-pay

## 2014-03-02 ENCOUNTER — Other Ambulatory Visit: Payer: Self-pay | Admitting: *Deleted

## 2014-03-02 MED ORDER — ISOSORB DINITRATE-HYDRALAZINE 20-37.5 MG PO TABS: 1.0000 | ORAL_TABLET | Freq: Two times a day (BID) | ORAL | Status: DC

## 2014-03-02 MED ORDER — METOLAZONE 2.5 MG PO TABS: 2.5000 mg | ORAL_TABLET | ORAL | Status: DC

## 2014-03-02 MED ORDER — CHOLINE FENOFIBRATE 135 MG PO CPDR: 135.0000 mg | DELAYED_RELEASE_CAPSULE | Freq: Every day | ORAL | Status: DC

## 2014-03-02 NOTE — Telephone Encounter (Signed)
This was already done by [facility]

## 2014-03-02 NOTE — Telephone Encounter (Signed)
Rx was sent to pharmacy electronically. 

## 2014-03-03 ENCOUNTER — Telehealth: Payer: Self-pay | Admitting: Internal Medicine

## 2014-03-03 ENCOUNTER — Other Ambulatory Visit: Payer: Self-pay

## 2014-03-03 MED ORDER — LOSARTAN POTASSIUM 50 MG PO TABS: 50.0000 mg | ORAL_TABLET | Freq: Every morning | ORAL | Status: DC

## 2014-03-03 NOTE — Telephone Encounter (Signed)
Received fax refill request  Rx # [id number] Medication:  Losartan Potassium 50 mg tab Qty 90 Sig:  Take on tablet by mouth once daily Physician:  Lovena Le

## 2014-03-03 NOTE — Telephone Encounter (Signed)
Please see paper in refill bin / tgs  °

## 2014-03-03 NOTE — Telephone Encounter (Signed)
Received refill for Metolazone please advise if this is ok to refill.

## 2014-03-03 NOTE — Telephone Encounter (Signed)
Medication sent via escribe.  

## 2014-03-05 ENCOUNTER — Encounter (HOSPITAL_COMMUNITY): Payer: Medicare Other | Attending: Internal Medicine

## 2014-03-05 ENCOUNTER — Encounter (HOSPITAL_COMMUNITY): Payer: Self-pay

## 2014-03-05 ENCOUNTER — Other Ambulatory Visit: Payer: Self-pay | Admitting: Family Medicine

## 2014-03-05 ENCOUNTER — Encounter (HOSPITAL_COMMUNITY): Payer: Medicare Other

## 2014-03-05 ENCOUNTER — Encounter (HOSPITAL_BASED_OUTPATIENT_CLINIC_OR_DEPARTMENT_OTHER): Payer: Medicare Other

## 2014-03-05 VITALS — BP 95/57 | HR 77 | Temp 98.0°F | Resp 21 | Wt 235.5 lb

## 2014-03-05 DIAGNOSIS — D638 Anemia in other chronic diseases classified elsewhere: Secondary | ICD-10-CM

## 2014-03-05 DIAGNOSIS — N183 Chronic kidney disease, stage 3 unspecified: Secondary | ICD-10-CM | POA: Diagnosis not present

## 2014-03-05 DIAGNOSIS — D594 Other nonautoimmune hemolytic anemias: Secondary | ICD-10-CM

## 2014-03-05 DIAGNOSIS — E1149 Type 2 diabetes mellitus with other diabetic neurological complication: Secondary | ICD-10-CM | POA: Diagnosis not present

## 2014-03-05 DIAGNOSIS — E119 Type 2 diabetes mellitus without complications: Secondary | ICD-10-CM | POA: Diagnosis not present

## 2014-03-05 DIAGNOSIS — R609 Edema, unspecified: Secondary | ICD-10-CM | POA: Diagnosis not present

## 2014-03-05 LAB — CBC WITH DIFFERENTIAL/PLATELET
Basophils Absolute: 0 10*3/uL (ref 0.0–0.1)
Basophils Relative: 0 % (ref 0–1)
Eosinophils Absolute: 0 10*3/uL (ref 0.0–0.7)
Eosinophils Relative: 0 % (ref 0–5)
HCT: 34.1 % — ABNORMAL LOW (ref 36.0–46.0)
Hemoglobin: 11.1 g/dL — ABNORMAL LOW (ref 12.0–15.0)
Lymphocytes Relative: 16 % (ref 12–46)
Lymphs Abs: 1.1 10*3/uL (ref 0.7–4.0)
MCH: 31.5 pg (ref 26.0–34.0)
MCHC: 32.6 g/dL (ref 30.0–36.0)
MCV: 96.9 fL (ref 78.0–100.0)
Monocytes Absolute: 0.5 10*3/uL (ref 0.1–1.0)
Monocytes Relative: 8 % (ref 3–12)
Neutro Abs: 5.2 10*3/uL (ref 1.7–7.7)
Neutrophils Relative %: 76 % (ref 43–77)
Platelets: 228 10*3/uL (ref 150–400)
RBC: 3.52 MIL/uL — ABNORMAL LOW (ref 3.87–5.11)
RDW: 14.2 % (ref 11.5–15.5)
WBC: 6.8 10*3/uL (ref 4.0–10.5)

## 2014-03-05 NOTE — Patient Instructions (Signed)
[place] Discharge Instructions  RECOMMENDATIONS MADE BY THE CONSULTANT AND ANY TEST RESULTS WILL BE SENT TO YOUR REFERRING PHYSICIAN.  EXAM FINDINGS BY THE PHYSICIAN TODAY AND SIGNS OR SYMPTOMS TO REPORT TO CLINIC OR PRIMARY PHYSICIAN: Exam and findings as discussed by Dr. Barnet Glasgow.  Your hemoglobin is 11.1 so you do not get procrit today.  Report increased fatigue, shortness of breath or other problems.  MEDICATIONS PRESCRIBED:  none  INSTRUCTIONS/FOLLOW-UP: Follow-up every 3 weeks with blood work and possible injections and office visit in 9 weeks.  Thank you for choosing [facility] to provide your oncology and hematology care.  To afford each patient quality time with our providers, please arrive at least 15 minutes before your scheduled appointment time.  With your help, our goal is to use those 15 minutes to complete the necessary work-up to ensure our physicians have the information they need to help with your evaluation and healthcare recommendations.    Effective January 1st, 2014, we ask that you re-schedule your appointment with our physicians should you arrive 10 or more minutes late for your appointment.  We strive to give you quality time with our providers, and arriving late affects you and other patients whose appointments are after yours.    Again, thank you for choosing [facility].  Our hope is that these requests will decrease the amount of time that you wait before being seen by our physicians.       _____________________________________________________________  Should you have questions after your visit to [facility], please contact our office at [phone] between the hours of 8:30 a.m. and 5:00 p.m.  Voicemails left after 4:30 p.m. will not be returned until the following business day.  For prescription refill requests, have your pharmacy contact our office with your prescription refill request.

## 2014-03-05 NOTE — Progress Notes (Signed)
[name] [name]  OFFICE PROGRESS NOTE  [name] Nakayama, MD [address] [place] [address]  DIAGNOSIS: No diagnosis found.  No chief complaint on file.   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  INTERVAL HISTORY: [name] [name] 80 y.o. 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 Last ferritin on 4/10/was 1788. She is receiving receiving Procrit with last dose of 40,000 units given on 02/13/2014 when hemoglobin was 10.2. Today is her 82th birthday! She is concerned about an ulcer between the cheeks of her blocks. She denies any fever, night sweats, diarrhea, constipation, PND, orthopnea, headaches, lower 70 swelling that is worsening, cough, wheezing, headache, or seizures.  MEDICAL HISTORY: Past Medical History  Diagnosis Date  . DJD (degenerative joint disease) of lumbar spine   . Chronic kidney disease, stage I   . Esophageal reflux   . Other and unspecified hyperlipidemia   . Other primary cardiomyopathies   . Obesity, unspecified   . Unspecified hypothyroidism   . DM (diabetes mellitus)   . Essential hypertension, benign   . 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 Pain in joint, pelvic region and thigh   . Insomnia, unspecified   . Iron deficiency 10/04/2011  . IDA (iron deficiency anemia)     Parenteral iron/Dr. Tressie [name]  . CHF (congestive heart failure)   . COPD (chronic obstructive pulmonary disease)   . Heart murmur   . ICD (implantable cardiac defibrillator) in place   . Adenomatous polyp of colon 12/16/2002    Dr. Collier Salina [name]/[facility]  . Hyperlipemia   . Hemolysis 09/24/2012  . Cancer     INTERIM HISTORY: has  HYPOTHYROIDISM; Type II or unspecified type diabetes mellitus with renal manifestations, not stated as uncontrolled(250.40); HYPERLIPIDEMIA; OBESITY; HYPERTENSION - with Cardiac Abnormalities; Non-ischemic cardiomyopathy with ICD; VARICOSE VEINS LOWER EXTREMITIES W/OTH COMPS; GERD; CHOLELITHIASIS; HIP PAIN, RIGHT; DEGENERATIVE JOINT DISEASE, SPINE; NAUSEA; Allergic rhinitis; Low back pain; Iron deficiency; Papilloma of breast; Warfarin-induced coagulopathy; Cardiorenal syndrome with renal failure; S/P AVR (aortic valve replacement); S/P MVR (mitral valve replacement); AICD (automatic cardioverter/defibrillator) present; CKD (chronic kidney disease), stage III - due to DM, HTN and cardiorenal syndrome; Chronic anticoagulation, on coumadin for Mechanical AVR and MVR; Upper leg pain, rt. from use of BSC?  ; COPD (chronic obstructive pulmonary disease); Chronic systolic heart failure; H/O adenomatous polyp of colon; High risk medication use; Routine general medical examination at a health care facility; Hemolysis; Spinal stenosis of lumbar region; OA (osteoarthritis) of knee; Rotator cuff syndrome of left shoulder; Other malaise and fatigue; Encounter for therapeutic drug monitoring; and Osteopenia on her problem list.    ALLERGIES:  is allergic to aspirin; niacin; and penicillins.  MEDICATIONS: has a current medication list which includes the following prescription(s): acetaminophen, albuterol, allopurinol, ester-c, choline fenofibrate, digoxin, docusate sodium, ezetimibe-simvastatin, fluticasone, fluticasone-salmeterol, folic acid, furosemide, gabapentin, hydrocodone-acetaminophen, isosorbide-hydralazine, levothyroxine, losartan, meclizine, metolazone, metoprolol succinate, centrum silver adult 50+, ondansetron, polyethylene glycol powder, potassium chloride sa, promethazine, temazepam, and warfarin.  SURGICAL HISTORY:  Past Surgical History  Procedure Laterality Date  . Thyroidectomy    . Pacemaker placed   2004  . Aortic and mitral valve replacement  2001  . Right breast cyst removed      benign   .  Right cataract removed      2005  . Defibrillator implanted 2006    . Cardiac valve replacement    . Cardiac catheterization    . Insert / replace / remove pacemaker    . Esophagogastroduodenoscopy  06/12/2007    Rourk- normal esophagus, small hiatal hernia, otherwise normal stomach, D1, D2  . Colonoscopy  06/12/2007    Rourk- Normal rectum/Normal colon  . Colonoscopy  12/16/02    small hemorrhoids  . Colonoscopy  06/20/2012    Procedure: COLONOSCOPY;  Surgeon: [name] Dolin, MD;  Location: [facility];  Service: Endoscopy;  Laterality: N/A;  10:45    FAMILY HISTORY: family history includes Cancer in her mother; Diabetes in her brother; Heart disease in her brother, father, and sister.  SOCIAL HISTORY:  reports that she quit smoking about 27 years ago. Her smoking use included Cigarettes. She has a 30 pack-year smoking history. She has never used smokeless tobacco. She reports that she does not drink alcohol or use illicit drugs.  REVIEW OF SYSTEMS:  Other than that discussed above is noncontributory.  PHYSICAL EXAMINATION: ECOG PERFORMANCE STATUS: 1 - Symptomatic but completely ambulatory  Blood pressure 95/57, pulse 77, temperature 98 F (36.7 C), temperature source Oral, resp. rate 21, weight 235 lb 8 oz (106.822 kg).  GENERAL:alert, no distress and comfortable SKIN: skin color, texture, turgor are normal, no rashes or significant lesions. Kissing ulcer on the right buttock. EYES: PERLA; Conjunctiva are pink and non-injected, sclera clear SINUSES: No redness or tenderness over maxillary or ethmoid sinuses OROPHARYNX:no exudate, no erythema on lips, buccal mucosa, or tongue. NECK: supple, thyroid normal size, non-tender, without nodularity. No masses CHEST: Normal AP diameter with no breast masses. LYMPH:  no palpable lymphadenopathy in the cervical, axillary or inguinal LUNGS:  clear to auscultation and percussion with normal breathing effort HEART: Regular rhythm with clicking sounds of replaced heart valves. No diastolic murmur.. ABDOMEN:abdomen soft, non-tender and normal bowel sounds MUSCULOSKELETAL:no cyanosis of digits and no clubbing. Range of motion normal.  NEURO: alert & oriented x 3 with fluent speech, no focal motor/sensory deficits   LABORATORY DATA: Anti-coag visit on 02/23/2014  Component Date Value Ref Range Status  . INR 02/23/2014 4.0   Final  Infusion on 02/13/2014  Component Date Value Ref Range Status  . WBC 02/13/2014 9.2  4.0 - 10.5 K/uL Final  . RBC 02/13/2014 3.28* 3.87 - 5.11 MIL/uL Final  . Hemoglobin 02/13/2014 10.2* 12.0 - 15.0 g/dL Final  . HCT 02/13/2014 31.7* 36.0 - 46.0 % Final  . MCV 02/13/2014 96.6  78.0 - 100.0 fL Final  . MCH 02/13/2014 31.1  26.0 - 34.0 pg Final  . MCHC 02/13/2014 32.2  30.0 - 36.0 g/dL Final  . RDW 02/13/2014 14.8  11.5 - 15.5 % Final  . Platelets 02/13/2014 274  150 - 400 K/uL Final  . Neutrophils Relative % 02/13/2014 81* 43 - 77 % Final  . Neutro Abs 02/13/2014 7.5  1.7 - 7.7 K/uL Final  . Lymphocytes Relative 02/13/2014 9* 12 - 46 % Final  . Lymphs Abs 02/13/2014 0.8  0.7 - 4.0 K/uL Final  . Monocytes Relative 02/13/2014 10  3 - 12 % Final  . Monocytes Absolute 02/13/2014 0.9  0.1 - 1.0 K/uL Final  . Eosinophils Relative 02/13/2014 0  0 - 5 % Final  . Eosinophils Absolute 02/13/2014 0.0  0.0 - 0.7 K/uL Final  . Basophils Relative 02/13/2014 0  0 - 1 % Final  .  Basophils Absolute 02/13/2014 0.0  0.0 - 0.1 K/uL Final  Anti-coag visit on 02/09/2014  Component Date Value Ref Range Status  . INR 02/09/2014 4.8   Final  Infusion on 02/06/2014  Component Date Value Ref Range Status  . WBC 02/06/2014 5.9  4.0 - 10.5 K/uL Final  . RBC 02/06/2014 3.36* 3.87 - 5.11 MIL/uL Final  . Hemoglobin 02/06/2014 10.6* 12.0 - 15.0 g/dL Final  . HCT 02/06/2014 32.9* 36.0 - 46.0 % Final  . MCV 02/06/2014 97.9   78.0 - 100.0 fL Final  . MCH 02/06/2014 31.5  26.0 - 34.0 pg Final  . MCHC 02/06/2014 32.2  30.0 - 36.0 g/dL Final  . RDW 02/06/2014 14.7  11.5 - 15.5 % Final  . Platelets 02/06/2014 247  150 - 400 K/uL Final  . Neutrophils Relative % 02/06/2014 73  43 - 77 % Final  . Neutro Abs 02/06/2014 4.3  1.7 - 7.7 K/uL Final  . Lymphocytes Relative 02/06/2014 19  12 - 46 % Final  . Lymphs Abs 02/06/2014 1.2  0.7 - 4.0 K/uL Final  . Monocytes Relative 02/06/2014 8  3 - 12 % Final  . Monocytes Absolute 02/06/2014 0.5  0.1 - 1.0 K/uL Final  . Eosinophils Relative 02/06/2014 0  0 - 5 % Final  . Eosinophils Absolute 02/06/2014 0.0  0.0 - 0.7 K/uL Final  . Basophils Relative 02/06/2014 0  0 - 1 % Final  . Basophils Absolute 02/06/2014 0.0  0.0 - 0.1 K/uL Final  Office Visit on 02/05/2014  Component Date Value Ref Range Status  . Microalb, Ur 02/05/2014 0.71  0.00 - 1.89 mg/dL Final  . Creatinine, Urine 02/05/2014 72.1   Final  . Microalb Creat Ratio 02/05/2014 9.8  0.0 - 30.0 mg/g Final    PATHOLOGY: No new pathology.  Urinalysis    Component Value Date/Time   COLORURINE YELLOW 03/27/2012 1201   APPEARANCEUR CLEAR 03/27/2012 1201   LABSPEC 1.018 03/27/2012 1201   PHURINE 5.5 03/27/2012 1201   GLUCOSEU NEGATIVE 03/27/2012 1201   GLUCOSEU NEG mg/dL 03/22/2010 2313   HGBUR NEGATIVE 03/27/2012 1201   HGBUR negative 05/27/2010 1122   BILIRUBINUR neg 03/11/2013 1443   BILIRUBINUR NEGATIVE 03/27/2012 1201   KETONESUR NEGATIVE 03/27/2012 1201   PROTEINUR neg 03/11/2013 1443   PROTEINUR NEGATIVE 03/27/2012 1201   UROBILINOGEN 0.2 03/11/2013 1443   UROBILINOGEN 0.2 03/27/2012 1201   NITRITE neg 03/11/2013 1443   NITRITE NEGATIVE 03/27/2012 1201   LEUKOCYTESUR Negative 03/11/2013 1443    RADIOGRAPHIC STUDIES: Dg Bone Density  02/10/2014   EXAM: DG DEXA BONE DENSITY STUDY  The Bone Mineral Densitometry hard-copy report (which includes all data, graphical display, and FRAX results when applicable) has been sent directly to  the ordering physician.  This report can also be obtained electronically by viewing images for this exam through the performing facility's EMR, or by logging directly into [employer].   Electronically Signed   By: Lavonia Dana M.D.   On: 02/10/2014 14:17    ASSESSMENT:  #1. 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 Procrit is maintain hemoglobin between 10.5 and 11.  #2. Status post aortic and mitral valve replacements.  #3. Chronic kidney disease.  #4. Increasing lower extremity edema, improved.    PLAN:  #1. Procrit 40,000 units subcutaneously if hemoglobin is less than 11 to be repeated every 3 weeks. #  2. Office visit 9 weeks with CBC and ferritin.   All questions were answered. The patient knows to call the clinic with any problems, questions or concerns. We can certainly see the patient much sooner if necessary.   I spent 25 minutes counseling the patient face to face. The total time spent in the appointment was 30 minutes.    Doroteo Bradford, MD 03/05/2014 3:03 PM  DISCLAIMER:  This note was dictated with voice recognition software.  Similar sounding words can inadvertently be transcribed inaccurately and may not be corrected upon review.

## 2014-03-05 NOTE — Progress Notes (Signed)
[name] [name] presented for labwork. Labs per MD order drawn via Peripheral Line 23 gauge needle inserted in right AC  Good blood return present. Procedure without incident.  Needle removed intact. Patient tolerated procedure well.

## 2014-03-05 NOTE — Telephone Encounter (Signed)
ok 

## 2014-03-06 ENCOUNTER — Other Ambulatory Visit (HOSPITAL_COMMUNITY): Payer: Medicare Other

## 2014-03-06 MED ORDER — METOLAZONE 2.5 MG PO TABS: 2.5000 mg | ORAL_TABLET | ORAL | Status: DC

## 2014-03-06 NOTE — Telephone Encounter (Signed)
Medication sent to pharmacy  

## 2014-03-06 NOTE — Addendum Note (Signed)
Addended by: Hilarie Fredrickson T on: 03/06/2014 07:11 AM   Modules accepted: Orders

## 2014-03-09 ENCOUNTER — Ambulatory Visit (INDEPENDENT_AMBULATORY_CARE_PROVIDER_SITE_OTHER): Payer: Medicare Other | Admitting: *Deleted

## 2014-03-09 DIAGNOSIS — Z5181 Encounter for therapeutic drug level monitoring: Secondary | ICD-10-CM | POA: Diagnosis not present

## 2014-03-09 DIAGNOSIS — Z7901 Long term (current) use of anticoagulants: Secondary | ICD-10-CM | POA: Diagnosis not present

## 2014-03-09 DIAGNOSIS — Z954 Presence of other heart-valve replacement: Secondary | ICD-10-CM | POA: Diagnosis not present

## 2014-03-09 DIAGNOSIS — Z952 Presence of prosthetic heart valve: Secondary | ICD-10-CM

## 2014-03-09 LAB — POCT INR: INR: 3.1

## 2014-03-11 ENCOUNTER — Telehealth: Payer: Self-pay

## 2014-03-11 MED ORDER — FOLIC ACID 1 MG PO TABS: ORAL_TABLET | ORAL | Status: DC

## 2014-03-11 NOTE — Telephone Encounter (Signed)
Med sent to pharmacy.

## 2014-03-17 ENCOUNTER — Other Ambulatory Visit: Payer: Self-pay

## 2014-03-17 MED ORDER — GABAPENTIN 300 MG PO CAPS: 300.0000 mg | ORAL_CAPSULE | Freq: Every day | ORAL | Status: DC

## 2014-03-17 MED ORDER — ALLOPURINOL 100 MG PO TABS: ORAL_TABLET | ORAL | Status: DC

## 2014-03-18 ENCOUNTER — Ambulatory Visit (HOSPITAL_COMMUNITY)
Admission: RE | Admit: 2014-03-18 | Discharge: 2014-03-18 | Disposition: A | Discharge status: Home / Self Care | Payer: Medicare Other | Source: Ambulatory Visit | Attending: Family Medicine | Admitting: Family Medicine

## 2014-03-18 ENCOUNTER — Other Ambulatory Visit: Payer: Self-pay | Admitting: Family Medicine

## 2014-03-18 DIAGNOSIS — D242 Benign neoplasm of left breast: Secondary | ICD-10-CM

## 2014-03-18 DIAGNOSIS — Z09 Encounter for follow-up examination after completed treatment for conditions other than malignant neoplasm: Secondary | ICD-10-CM

## 2014-03-18 DIAGNOSIS — N63 Unspecified lump in unspecified breast: Secondary | ICD-10-CM | POA: Insufficient documentation

## 2014-03-23 ENCOUNTER — Ambulatory Visit (INDEPENDENT_AMBULATORY_CARE_PROVIDER_SITE_OTHER): Payer: Medicare Other | Admitting: *Deleted

## 2014-03-23 DIAGNOSIS — Z5181 Encounter for therapeutic drug level monitoring: Secondary | ICD-10-CM | POA: Diagnosis not present

## 2014-03-23 DIAGNOSIS — Z7901 Long term (current) use of anticoagulants: Secondary | ICD-10-CM | POA: Diagnosis not present

## 2014-03-23 DIAGNOSIS — Z954 Presence of other heart-valve replacement: Secondary | ICD-10-CM | POA: Diagnosis not present

## 2014-03-23 DIAGNOSIS — Z952 Presence of prosthetic heart valve: Secondary | ICD-10-CM

## 2014-03-23 LAB — POCT INR: INR: 3.3

## 2014-03-26 ENCOUNTER — Encounter (HOSPITAL_COMMUNITY): Payer: Medicare Other | Attending: Internal Medicine

## 2014-03-26 ENCOUNTER — Encounter (HOSPITAL_COMMUNITY): Payer: Medicare Other

## 2014-03-26 DIAGNOSIS — D638 Anemia in other chronic diseases classified elsewhere: Secondary | ICD-10-CM

## 2014-03-26 DIAGNOSIS — D594 Other nonautoimmune hemolytic anemias: Secondary | ICD-10-CM

## 2014-03-26 LAB — CBC WITH DIFFERENTIAL/PLATELET
Basophils Absolute: 0 10*3/uL (ref 0.0–0.1)
Basophils Relative: 0 % (ref 0–1)
Eosinophils Absolute: 0 10*3/uL (ref 0.0–0.7)
Eosinophils Relative: 0 % (ref 0–5)
HCT: 34.3 % — ABNORMAL LOW (ref 36.0–46.0)
Hemoglobin: 11.1 g/dL — ABNORMAL LOW (ref 12.0–15.0)
Lymphocytes Relative: 21 % (ref 12–46)
Lymphs Abs: 1.4 10*3/uL (ref 0.7–4.0)
MCH: 31.1 pg (ref 26.0–34.0)
MCHC: 32.4 g/dL (ref 30.0–36.0)
MCV: 96.1 fL (ref 78.0–100.0)
Monocytes Absolute: 0.5 10*3/uL (ref 0.1–1.0)
Monocytes Relative: 8 % (ref 3–12)
Neutro Abs: 4.6 10*3/uL (ref 1.7–7.7)
Neutrophils Relative %: 71 % (ref 43–77)
Platelets: 208 10*3/uL (ref 150–400)
RBC: 3.57 MIL/uL — ABNORMAL LOW (ref 3.87–5.11)
RDW: 14.2 % (ref 11.5–15.5)
WBC: 6.4 10*3/uL (ref 4.0–10.5)

## 2014-03-26 NOTE — Progress Notes (Signed)
[name] [name] presented for labwork. Labs per MD order drawn via Peripheral Line 23 gauge needle inserted in right antecubital.  Good blood return present. Procedure without incident.  Needle removed intact. Patient tolerated procedure well.   Hemoglobin 11.1 today. Procrit not given, treatment parameters not met. Patient to return to clinic as scheduled.

## 2014-04-01 ENCOUNTER — Other Ambulatory Visit: Payer: Self-pay | Admitting: Family Medicine

## 2014-04-03 ENCOUNTER — Encounter: Payer: Self-pay | Admitting: Internal Medicine

## 2014-04-03 ENCOUNTER — Ambulatory Visit (INDEPENDENT_AMBULATORY_CARE_PROVIDER_SITE_OTHER): Payer: Medicare Other | Admitting: *Deleted

## 2014-04-03 DIAGNOSIS — I428 Other cardiomyopathies: Secondary | ICD-10-CM | POA: Diagnosis not present

## 2014-04-03 DIAGNOSIS — I5022 Chronic systolic (congestive) heart failure: Secondary | ICD-10-CM | POA: Diagnosis not present

## 2014-04-03 LAB — MDC_IDC_ENUM_SESS_TYPE_INCLINIC
Battery Voltage: 2.69 V
Brady Statistic AP VP Percent: 75.57 %
Brady Statistic AP VS Percent: 0.03 %
Brady Statistic AS VP Percent: 24.35 %
Brady Statistic AS VS Percent: 0.05 %
Brady Statistic RA Percent Paced: 75.6 %
Brady Statistic RV Percent Paced: 99.92 %
Date Time Interrogation Session: 20150710141005
HighPow Impedance: 35 Ohm
HighPow Impedance: 44 Ohm
Lead Channel Impedance Value: 342 Ohm
Lead Channel Impedance Value: 418 Ohm
Lead Channel Pacing Threshold Amplitude: 0.625 V
Lead Channel Pacing Threshold Amplitude: 1.375 V
Lead Channel Pacing Threshold Pulse Width: 0.4 ms
Lead Channel Pacing Threshold Pulse Width: 0.4 ms
Lead Channel Sensing Intrinsic Amplitude: 1.875 mV
Lead Channel Sensing Intrinsic Amplitude: 11.625 mV
Lead Channel Sensing Intrinsic Amplitude: 11.625 mV
Lead Channel Sensing Intrinsic Amplitude: 2.5 mV
Lead Channel Setting Pacing Amplitude: 2 V
Lead Channel Setting Pacing Amplitude: 2.5 V
Lead Channel Setting Pacing Pulse Width: 0.8 ms
Lead Channel Setting Sensing Sensitivity: 0.3 mV
Zone Setting Detection Interval: 320 ms
Zone Setting Detection Interval: 350 ms
Zone Setting Detection Interval: 360 ms
Zone Setting Detection Interval: 410 ms

## 2014-04-03 NOTE — Progress Notes (Signed)
ICD check in clinic. Battery voltage 2.69 V. Normal device function. No episodes recorded. Optivol increase from 6-25 and ongoing. Pt complains of SOB on exertion. Changed RA output to 2.00 V and RV output to 2.50 @ 0.80 ms. ROV in 3 mths w/device clinic/RDS.

## 2014-04-05 ENCOUNTER — Other Ambulatory Visit: Payer: Self-pay

## 2014-04-05 ENCOUNTER — Emergency Department (HOSPITAL_COMMUNITY): Payer: Medicare Other

## 2014-04-05 ENCOUNTER — Encounter (HOSPITAL_COMMUNITY): Payer: Self-pay | Admitting: Emergency Medicine

## 2014-04-05 ENCOUNTER — Emergency Department (HOSPITAL_COMMUNITY)
Admission: EM | Admit: 2014-04-05 | Discharge: 2014-04-05 | Disposition: A | Discharge status: Home / Self Care | Payer: Medicare Other | Attending: Emergency Medicine | Admitting: Emergency Medicine

## 2014-04-05 DIAGNOSIS — Z880 Allergy status to penicillin: Secondary | ICD-10-CM | POA: Diagnosis not present

## 2014-04-05 DIAGNOSIS — D589 Hereditary hemolytic anemia, unspecified: Secondary | ICD-10-CM | POA: Insufficient documentation

## 2014-04-05 DIAGNOSIS — Z860100 Personal history of colon polyps, unspecified: Secondary | ICD-10-CM | POA: Insufficient documentation

## 2014-04-05 DIAGNOSIS — I129 Hypertensive chronic kidney disease with stage 1 through stage 4 chronic kidney disease, or unspecified chronic kidney disease: Secondary | ICD-10-CM | POA: Insufficient documentation

## 2014-04-05 DIAGNOSIS — E119 Type 2 diabetes mellitus without complications: Secondary | ICD-10-CM | POA: Insufficient documentation

## 2014-04-05 DIAGNOSIS — Z95810 Presence of automatic (implantable) cardiac defibrillator: Secondary | ICD-10-CM | POA: Insufficient documentation

## 2014-04-05 DIAGNOSIS — E039 Hypothyroidism, unspecified: Secondary | ICD-10-CM | POA: Insufficient documentation

## 2014-04-05 DIAGNOSIS — M79609 Pain in unspecified limb: Secondary | ICD-10-CM | POA: Insufficient documentation

## 2014-04-05 DIAGNOSIS — I509 Heart failure, unspecified: Secondary | ICD-10-CM | POA: Insufficient documentation

## 2014-04-05 DIAGNOSIS — R011 Cardiac murmur, unspecified: Secondary | ICD-10-CM | POA: Diagnosis not present

## 2014-04-05 DIAGNOSIS — J441 Chronic obstructive pulmonary disease with (acute) exacerbation: Secondary | ICD-10-CM | POA: Diagnosis not present

## 2014-04-05 DIAGNOSIS — Z9889 Other specified postprocedural states: Secondary | ICD-10-CM | POA: Diagnosis not present

## 2014-04-05 DIAGNOSIS — Z79899 Other long term (current) drug therapy: Secondary | ICD-10-CM | POA: Diagnosis not present

## 2014-04-05 DIAGNOSIS — Z7901 Long term (current) use of anticoagulants: Secondary | ICD-10-CM | POA: Diagnosis not present

## 2014-04-05 DIAGNOSIS — I10 Essential (primary) hypertension: Secondary | ICD-10-CM | POA: Diagnosis not present

## 2014-04-05 DIAGNOSIS — K219 Gastro-esophageal reflux disease without esophagitis: Secondary | ICD-10-CM | POA: Diagnosis not present

## 2014-04-05 DIAGNOSIS — E669 Obesity, unspecified: Secondary | ICD-10-CM | POA: Insufficient documentation

## 2014-04-05 DIAGNOSIS — N181 Chronic kidney disease, stage 1: Secondary | ICD-10-CM | POA: Diagnosis not present

## 2014-04-05 DIAGNOSIS — Z87891 Personal history of nicotine dependence: Secondary | ICD-10-CM | POA: Insufficient documentation

## 2014-04-05 DIAGNOSIS — IMO0002 Reserved for concepts with insufficient information to code with codable children: Secondary | ICD-10-CM | POA: Insufficient documentation

## 2014-04-05 DIAGNOSIS — R0602 Shortness of breath: Secondary | ICD-10-CM | POA: Diagnosis not present

## 2014-04-05 DIAGNOSIS — M79606 Pain in leg, unspecified: Secondary | ICD-10-CM

## 2014-04-05 LAB — BASIC METABOLIC PANEL
Anion gap: 11 (ref 5–15)
BUN: 57 mg/dL — ABNORMAL HIGH (ref 6–23)
CO2: 34 mEq/L — ABNORMAL HIGH (ref 19–32)
Calcium: 10.1 mg/dL (ref 8.4–10.5)
Chloride: 96 mEq/L (ref 96–112)
Creatinine, Ser: 2.43 mg/dL — ABNORMAL HIGH (ref 0.50–1.10)
GFR calc Af Amer: 21 mL/min — ABNORMAL LOW (ref >90)
GFR calc non Af Amer: 18 mL/min — ABNORMAL LOW (ref >90)
Glucose, Bld: 106 mg/dL — ABNORMAL HIGH (ref 70–99)
Potassium: 4.3 mEq/L (ref 3.7–5.3)
Sodium: 141 mEq/L (ref 137–147)

## 2014-04-05 LAB — CBC WITH DIFFERENTIAL/PLATELET
Basophils Absolute: 0 10*3/uL (ref 0.0–0.1)
Basophils Relative: 0 % (ref 0–1)
Eosinophils Absolute: 0 10*3/uL (ref 0.0–0.7)
Eosinophils Relative: 0 % (ref 0–5)
HCT: 32.8 % — ABNORMAL LOW (ref 36.0–46.0)
Hemoglobin: 10.7 g/dL — ABNORMAL LOW (ref 12.0–15.0)
Lymphocytes Relative: 16 % (ref 12–46)
Lymphs Abs: 1 10*3/uL (ref 0.7–4.0)
MCH: 30.9 pg (ref 26.0–34.0)
MCHC: 32.6 g/dL (ref 30.0–36.0)
MCV: 94.8 fL (ref 78.0–100.0)
Monocytes Absolute: 0.4 10*3/uL (ref 0.1–1.0)
Monocytes Relative: 7 % (ref 3–12)
Neutro Abs: 4.8 10*3/uL (ref 1.7–7.7)
Neutrophils Relative %: 77 % (ref 43–77)
Platelets: 196 10*3/uL (ref 150–400)
RBC: 3.46 MIL/uL — ABNORMAL LOW (ref 3.87–5.11)
RDW: 14.2 % (ref 11.5–15.5)
WBC: 6.2 10*3/uL (ref 4.0–10.5)

## 2014-04-05 LAB — D-DIMER, QUANTITATIVE: D-Dimer, Quant: 0.54 ug/mL-FEU — ABNORMAL HIGH (ref 0.00–0.48)

## 2014-04-05 LAB — TROPONIN I: Troponin I: <0.3 ng/mL (ref <0.30)

## 2014-04-05 LAB — PRO B NATRIURETIC PEPTIDE: Pro B Natriuretic peptide (BNP): 1713 pg/mL — ABNORMAL HIGH (ref 0–450)

## 2014-04-05 LAB — PROTIME-INR
INR: 2.02 — ABNORMAL HIGH (ref 0.00–1.49)
Prothrombin Time: 22.9 seconds — ABNORMAL HIGH (ref 11.6–15.2)

## 2014-04-05 MED ORDER — METHOCARBAMOL 500 MG PO TABS: 500.0000 mg | ORAL_TABLET | Freq: Three times a day (TID) | ORAL | Status: DC

## 2014-04-05 NOTE — Discharge Instructions (Signed)
Try the robaxin for your leg pain. Take your hydrocodone for your pain. Return to radiology tomorrow at 2 pm to get a doppler ultrasound to check your legs for clots. Take your coumadin pill tonight. You INR was 2.0 today.   Chronic Pain Discharge Instructions  Emergency care providers appreciate that many patients coming to [place] are in severe pain and we wish to address their pain in the safest, most responsible manner.  It is important to recognize however, that the proper treatment of chronic pain differs from that of the pain of injuries and acute illnesses.  Our goal is to provide quality, safe, personalized care and we thank you for giving [place] the opportunity to serve you. The use of narcotics and related agents for chronic pain syndromes may lead to additional physical and psychological problems.  Nearly as many people die from prescription narcotics each year as die from car crashes.  Additionally, this risk is increased if such prescriptions are obtained from a variety of sources.  Therefore, only your primary care physician or a pain management specialist is able to safely treat such syndromes with narcotic medications long-term.    Documentation revealing such prescriptions have been sought from multiple sources may prohibit [place] from providing a refill or different narcotic medication.  Your name may be checked first through the [facility].  This database is a record of controlled substance medication prescriptions that the patient has received.  This has been established by [facility] in an effort to eliminate the dangerous, and often life threatening, practice of obtaining multiple prescriptions from different medical providers.   If you have a chronic pain syndrome (i.e. chronic headaches, recurrent back or neck pain, dental pain, abdominal or pelvis pain without a specific diagnosis, or neuropathic pain such as fibromyalgia) or recurrent visits for  the same condition without an acute diagnosis, you may be treated with non-narcotics and other non-addictive medicines.  Allergic reactions or negative side effects that may be reported by a patient to such medications will not typically lead to the use of a narcotic analgesic or other controlled substance as an alternative.   Patients managing chronic pain with a personal physician should have provisions in place for breakthrough pain.  If you are in crisis, you should call your physician.  If your physician directs you to the emergency department, please have the doctor call and speak to our attending physician concerning your care.   When patients come to the Emergency Department (ED) with acute medical conditions in which the Emergency Department physician feels appropriate to prescribe narcotic or sedating pain medication, the physician will prescribe these in very limited quantities.  The amount of these medications will last only until you can see your primary care physician in his/her office.  Any patient who returns to the ED seeking refills should expect only non-narcotic pain medications.   In the event of an acute medical condition exists and the emergency physician feels it is necessary that the patient be given a narcotic or sedating medication -  a responsible adult driver should be present in the room prior to the medication being given by the nurse.   Prescriptions for narcotic or sedating medications that have been lost, stolen or expired will not be refilled in the Emergency Department.    Patients who have chronic pain may receive non-narcotic prescriptions until seen by their primary care physician.  It is every patients personal responsibility to maintain active prescriptions with  his or her primary care physician or specialist.

## 2014-04-05 NOTE — ED Notes (Signed)
Patient c/o bilateral leg pain that started this morning. Denies any known injury. Per patient occasional swelling in legs. Per patient takes coumadin.

## 2014-04-05 NOTE — ED Provider Notes (Signed)
CSN: [id number]     Arrival date & time 04/05/14  1255 History  This chart was scribed for [name] Norrie, MD by Vernell Barrier, ED scribe. This patient was seen in room APA04/APA04 and the patient's care was started at 1:24 PM.  Chief Complaint  Patient presents with  . Leg Pain   The history is provided by the patient. No language interpreter was used.   HPI Comments: [name] [name] is a 80 y.o. female who presents to the Emergency Department complaining of gradually worsening intermittent bilateral lower extremity pain w/ intermittent lower extremity edema. Pain has been present "for some time now" but worsening over the past couple of days. Pain primarily from the front of the knee and down but states she had some stinging in her thighs this morning. Some mild calf soreness; right worse than left. Intermittent swelling usually in the bilateral ankles; no wounds, cuts, or areas draining around the ankle. No swelling currently. Able to walk but with SOB. States SOB is becoming more frequent over the past few weeks, but is also chronic. Also reports some mild left sided chest tightness yesterday.  Episode lasted approximately 15-20 minutes. Sitting when pain began. Resolved with Tums. Reports similar pain some time ago; no intervention needed at that time. Uses cane for ambulatory assistance. Been getting steroid injection in the right knee for some time but states as of recently is not providing relief. Artificial heart valve last replaced in 2011. Taking Coumadin; June 29th INR was 3.3. No recent travel. No trauma to her legs. No hx of blood clot. Quit smoking years ago. Lives with her brother who is also in the ED today.  PCP Dr [name]  Past Medical History  Diagnosis Date  . DJD (degenerative joint disease) of lumbar spine   . Chronic kidney disease, stage I   . Esophageal reflux   . Other and unspecified hyperlipidemia   . Other primary cardiomyopathies   . Obesity, unspecified   .  Unspecified hypothyroidism   . DM (diabetes mellitus)   . Essential hypertension, benign   . 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 Pain in joint, pelvic region and thigh   . Insomnia, unspecified   . Iron deficiency 10/04/2011  . IDA (iron deficiency anemia)     Parenteral iron/[name]  . CHF (congestive heart failure)   . COPD (chronic obstructive pulmonary disease)   . Heart murmur   . ICD (implantable cardiac defibrillator) in place   . Adenomatous polyp of colon 12/16/2002    Dr. [name]/[facility]  . Hyperlipemia   . Hemolysis 09/24/2012  . Cancer    Past Surgical History  Procedure Laterality Date  . Thyroidectomy    . Pacemaker placed  2004  . Aortic and mitral valve replacement  2001  . Right breast cyst removed      benign   . Right cataract removed      2005  . Defibrillator implanted 2006    . Cardiac valve replacement    . Cardiac catheterization    . Insert / replace / remove pacemaker    . Esophagogastroduodenoscopy  06/12/2007    Rourk- normal esophagus, small hiatal hernia, otherwise normal stomach, D1, D2  . Colonoscopy  06/12/2007    Rourk- Normal rectum/Normal colon  . Colonoscopy  12/16/02    small hemorrhoids  . Colonoscopy  06/20/2012    Procedure: COLONOSCOPY;  Surgeon: Daneil Dolin, MD;  Location: [facility];  Service: Endoscopy;  Laterality: N/A;  10:45   Family History  Problem Relation Age of Onset  . Cancer Mother     behind pancres  . Heart disease Father   . Heart disease Sister     Heart attack  . Heart disease Brother   . Diabetes Brother    History  Substance Use Topics  . Smoking status: Former Smoker -- 0.75 packs/day for 40 years    Types: Cigarettes    Quit date: 03/08/1987  . Smokeless tobacco: Never Used  . Alcohol Use: No   Uses a cane Lives with her brother   OB History   Grav Para Term Preterm Abortions TAB SAB Ect Mult Living            0      Review of Systems  Respiratory: Positive for shortness of breath. Negative for chest tightness.   Cardiovascular: Negative for leg swelling.  Musculoskeletal: Positive for myalgias.  All other systems reviewed and are negative.  Allergies  Aspirin; Niacin; and Penicillins  Home Medications   Prior to Admission medications   Medication Sig Start Date End Date Taking? Authorizing Provider  acetaminophen (TYLENOL) 325 MG tablet Take 325-650 mg by mouth every 6 (six) hours as needed for mild pain, moderate pain or headache.   Yes Historical Provider, MD  albuterol (PROVENTIL) (2.5 MG/3ML) 0.083% nebulizer solution Take 2.5 mg by nebulization 2 (two) times daily as needed for wheezing or shortness of breath.   Yes Historical Provider, MD  allopurinol (ZYLOPRIM) 100 MG tablet Take 100 mg by mouth daily. 03/17/14  Yes Fayrene Helper, MD  Choline Fenofibrate (TRILIPIX) 135 MG capsule Take 1 capsule (135 mg total) by mouth daily. 03/02/14  Yes Fayrene Helper, MD  digoxin (LANOXIN) 0.125 MG tablet Take 0.125 mg by mouth every morning.    Yes Historical Provider, MD  docusate sodium (COLACE) 100 MG capsule Take 3 mg by mouth at bedtime.    Yes Historical Provider, MD  ezetimibe-simvastatin (VYTORIN) 10-20 MG per tablet Take 1 tablet by mouth every Monday, Wednesday, and Friday. 10/23/13  Yes Fayrene Helper, MD  fluticasone (FLONASE) 50 MCG/ACT nasal spray Place 2 sprays into both nostrils daily as needed for allergies. 12/17/13  Yes Fayrene Helper, MD  Fluticasone-Salmeterol (ADVAIR DISKUS) 100-50 MCG/DOSE AEPB Inhale 1 puff into the lungs 2 (two) times daily. 02/27/14  Yes Fayrene Helper, MD  folic acid (FOLVITE) 1 MG tablet take 1 tablet by mouth once daily 03/11/14  Yes Fayrene Helper, MD  furosemide (LASIX) 40 MG tablet Take 40 mg by mouth daily.   Yes Historical Provider, MD  gabapentin (NEURONTIN) 300 MG capsule Take 1 capsule (300 mg total) by mouth at bedtime. 03/17/14  Yes  Fayrene Helper, MD  HYDROcodone-acetaminophen (NORCO/VICODIN) 5-325 MG per tablet Take 0.5 tablets by mouth every 6 (six) hours as needed for pain.    Yes Historical Provider, MD  isosorbide-hydrALAZINE (BIDIL) 20-37.5 MG per tablet Take 1 tablet by mouth 2 (two) times daily. 03/02/14  Yes Fayrene Helper, MD  losartan (COZAAR) 50 MG tablet Take 50 mg by mouth daily.   Yes Historical Provider, MD  meclizine (ANTIVERT) 12.5 MG tablet Take 12.5 mg by mouth 2 (two) times daily as needed for dizziness.   Yes Historical Provider, MD  metolazone (ZAROXOLYN) 2.5 MG tablet Take 2.5 mg by mouth once a week. Uses on Wednesday 03/06/14  Yes Evans Lance,  MD  metoprolol succinate (TOPROL XL) 100 MG 24 hr tablet Take 100 mg by mouth every evening. Take with or immediately following a meal. 10/23/13 10/23/14 Yes Fayrene Helper, MD  Multiple Vitamins-Minerals (CENTRUM SILVER ADULT 50+) TABS Take 1 tablet by mouth once a week.   Yes Historical Provider, MD  ondansetron (ZOFRAN) 4 MG tablet Take 4 mg by mouth daily as needed for nausea. 11/04/13  Yes Fayrene Helper, MD  polyethylene glycol powder [facility]) powder Take 17 g by mouth daily as needed for mild constipation.    Yes Historical Provider, MD  potassium chloride SA (K-DUR,KLOR-CON) 20 MEQ tablet Take 1 tablet (20 mEq total) by mouth daily. 07/15/13  Yes Fayrene Helper, MD  promethazine (PHENERGAN) 25 MG tablet Take 25 mg by mouth every 6 (six) hours as needed for nausea or vomiting.   Yes Historical Provider, MD  SYNTHROID 75 MCG tablet Take 1 tablet by mouth  daily   Yes Fayrene Helper, MD  temazepam (RESTORIL) 30 MG capsule Take 30 mg by mouth at bedtime as needed for sleep.   Yes Historical Provider, MD  warfarin (COUMADIN) 5 MG tablet Take 5-7.5 mg by mouth every evening. Takes 7.5 mg every day except of Mon and Thurs when patient takes 5 mg. 10/23/13  Yes Fayrene Helper, MD   Triage vitals: BP 116/64  Pulse 85  Temp(Src) 97.9 F  (36.6 C) (Oral)  Resp 20  Ht 5\' 7"  (1.702 m)  Wt 230 lb (104.327 kg)  BMI 36.01 kg/m2  SpO2 95%  Vital signs normal    Physical Exam  Nursing note and vitals reviewed. Constitutional: She is oriented to person, place, and time. She appears well-developed and well-nourished.  Non-toxic appearance. She does not appear ill. No distress.  HENT:  Head: Normocephalic and atraumatic.  Right Ear: External ear normal.  Left Ear: External ear normal.  Nose: Nose normal. No mucosal edema or rhinorrhea.  Mouth/Throat: Oropharynx is clear and moist and mucous membranes are normal. No dental abscesses or uvula swelling.  Eyes: Conjunctivae and EOM are normal. Pupils are equal, round, and reactive to light.  Neck: Normal range of motion and full passive range of motion without pain. Neck supple.  Cardiovascular: Normal rate, regular rhythm and normal heart sounds.  Exam reveals no gallop and no friction rub.   No murmur heard. Mechanical click   Pulmonary/Chest: Effort normal and breath sounds normal. No respiratory distress. She has no wheezes. She has no rhonchi. She has no rales. She exhibits no tenderness and no crepitus.  Abdominal: Soft. Normal appearance and bowel sounds are normal. She exhibits no distension. There is no tenderness. There is no rebound and no guarding.  Musculoskeletal: Normal range of motion. She exhibits no edema and no tenderness.  Moves all extremities well. Calves tender bilaterally; right worse than left. Non tender medial thighs. No pitting edema, open wounds  Neurological: She is alert and oriented to person, place, and time. She has normal strength. No cranial nerve deficit.  Skin: Skin is warm, dry and intact. No rash noted. No erythema. No pallor.  Psychiatric: She has a normal mood and affect. Her speech is normal and behavior is normal. Her mood appears not anxious.    ED Course  Procedures (including critical care time) DIAGNOSTIC STUDIES: Oxygen  Saturation is 95% on room air, normal. by my interpretation.    COORDINATION OF CARE: At 1:37 PM: Discussed treatment plan with patient which includes blood work  and CXR. Patient agrees.   3:57 PM: Test results discussed with patient. Screen for MI normal. Chronic anemia. Kidney function slightly elevated and stable INR is 2.0. Screen for CHF was 1700; better than prior screenings. CXR showed heart was slightly enlarged but no excess fluid. Discussed with patient to return tomorrow to have test completed to check for blood clot in her legs. Patient appears to have chronic pain in her legs. She is on chronic Coumadin and although her INR is less than 3 her cardiologist wants her to shoot for with her mechanical valve she is still in the therapeutic range at  2. Pt not given lovenox for that reason.   Pt to return tomorrow for 2:15 appt for bilateral doppler US of her legs.   Labs Review Results for orders placed during the hospital encounter of 04/05/14  CBC WITH DIFFERENTIAL      Result Value Ref Range   WBC 6.2  4.0 - 10.5 K/uL   RBC 3.46 (*) 3.87 - 5.11 MIL/uL   Hemoglobin 10.7 (*) 12.0 - 15.0 g/dL   HCT 32.8 (*) 36.0 - 46.0 %   MCV 94.8  78.0 - 100.0 fL   MCH 30.9  26.0 - 34.0 pg   MCHC 32.6  30.0 - 36.0 g/dL   RDW 14.2  11.5 - 15.5 %   Platelets 196  150 - 400 K/uL   Neutrophils Relative % 77  43 - 77 %   Neutro Abs 4.8  1.7 - 7.7 K/uL   Lymphocytes Relative 16  12 - 46 %   Lymphs Abs 1.0  0.7 - 4.0 K/uL   Monocytes Relative 7  3 - 12 %   Monocytes Absolute 0.4  0.1 - 1.0 K/uL   Eosinophils Relative 0  0 - 5 %   Eosinophils Absolute 0.0  0.0 - 0.7 K/uL   Basophils Relative 0  0 - 1 %   Basophils Absolute 0.0  0.0 - 0.1 K/uL  BASIC METABOLIC PANEL      Result Value Ref Range   Sodium 141  137 - 147 mEq/L   Potassium 4.3  3.7 - 5.3 mEq/L   Chloride 96  96 - 112 mEq/L   CO2 34 (*) 19 - 32 mEq/L   Glucose, Bld 106 (*) 70 - 99 mg/dL   BUN 57 (*) 6 - 23 mg/dL   Creatinine, Ser  2.43 (*) 0.50 - 1.10 mg/dL   Calcium 10.1  8.4 - 10.5 mg/dL   GFR calc non Af Amer 18 (*) >90 mL/min   GFR calc Af Amer 21 (*) >90 mL/min   Anion gap 11  5 - 15  PROTIME-INR      Result Value Ref Range   Prothrombin Time 22.9 (*) 11.6 - 15.2 seconds   INR 2.02 (*) 0.00 - 1.49  D-DIMER, QUANTITATIVE      Result Value Ref Range   D-Dimer, Quant 0.54 (*) 0.00 - 0.48 ug/mL-FEU  PRO B NATRIURETIC PEPTIDE      Result Value Ref Range   Pro B Natriuretic peptide (BNP) 1713.0 (*) 0 - 450 pg/mL  TROPONIN I      Result Value Ref Range   Troponin I <0.30  <0.30 ng/mL    Laboratory interpretation all normal except stable anemia, minimally elevated d-dimer, BNP elevated but lower than her high of 6000, stable renal insufficiency    Imaging Review Dg Chest 2 View  04/05/2014   CLINICAL DATA:  Lower extremity pain.  Shortness of breath.  EXAM: CHEST  2 VIEW  COMPARISON:  PA and lateral chest 01/06/2014 in 03/29/2012.  FINDINGS: AICD is again seen. The patient is status post aortic and mitral valve replacement. There is marked cardiomegaly. No pulmonary edema. No consolidative process, pneumothorax or effusion. Remote, mild mid thoracic compression fracture is unchanged.  IMPRESSION: Cardiomegaly without acute disease.   Electronically Signed   By: Inge Rise M.D.   On: 04/05/2014 14:56     EKG Interpretation None        Date: 04/05/2014  Rate: 72  Rhythm: Paced rhythm   Old EKG Reviewed: NSCSLT    MDM   Final diagnoses:  Pain of lower extremity, unspecified laterality     Discharge Medication List as of 04/05/2014  4:17 PM    Robaxin 500 mg TID  Plan discharge   Rolland Porter, MD, [place]   .i   I personally performed the services described in this documentation, which was scribed in my presence. The recorded information has been reviewed and considered.  Rolland Porter, MD, FACEP    [name] Norrie, MD 04/05/14 [phone]

## 2014-04-05 NOTE — ED Notes (Signed)
Pt c/o bilateral leg pain that began late last night. Pt denies any strenuous activity, denies injury. Pt ambulatory. Pt also denies swelling, SOB, chest pain.

## 2014-04-06 ENCOUNTER — Ambulatory Visit (HOSPITAL_COMMUNITY)
Admit: 2014-04-06 | Discharge: 2014-04-06 | Disposition: A | Discharge status: Home / Self Care | Payer: Medicare Other | Source: Ambulatory Visit | Attending: Emergency Medicine | Admitting: Emergency Medicine

## 2014-04-06 DIAGNOSIS — M7989 Other specified soft tissue disorders: Secondary | ICD-10-CM | POA: Insufficient documentation

## 2014-04-06 DIAGNOSIS — M7120 Synovial cyst of popliteal space [Baker], unspecified knee: Secondary | ICD-10-CM | POA: Insufficient documentation

## 2014-04-06 DIAGNOSIS — M79609 Pain in unspecified limb: Secondary | ICD-10-CM | POA: Insufficient documentation

## 2014-04-06 NOTE — ED Provider Notes (Signed)
Patient returned for followup ultrasound of her legs as planned with no evidence of DVT in either leg; the patient was informed of these results and will follow up with her primary care Dr. within one week.  [name] Relic, MD 04/06/14 [phone]

## 2014-04-13 DIAGNOSIS — N189 Chronic kidney disease, unspecified: Secondary | ICD-10-CM | POA: Diagnosis not present

## 2014-04-13 DIAGNOSIS — R809 Proteinuria, unspecified: Secondary | ICD-10-CM | POA: Diagnosis not present

## 2014-04-13 DIAGNOSIS — I10 Essential (primary) hypertension: Secondary | ICD-10-CM | POA: Diagnosis not present

## 2014-04-13 DIAGNOSIS — D649 Anemia, unspecified: Secondary | ICD-10-CM | POA: Diagnosis not present

## 2014-04-13 DIAGNOSIS — Z79899 Other long term (current) drug therapy: Secondary | ICD-10-CM | POA: Diagnosis not present

## 2014-04-13 DIAGNOSIS — E559 Vitamin D deficiency, unspecified: Secondary | ICD-10-CM | POA: Diagnosis not present

## 2014-04-14 ENCOUNTER — Telehealth: Payer: Self-pay | Admitting: Family Medicine

## 2014-04-14 DIAGNOSIS — I429 Cardiomyopathy, unspecified: Secondary | ICD-10-CM

## 2014-04-14 DIAGNOSIS — I5022 Chronic systolic (congestive) heart failure: Secondary | ICD-10-CM

## 2014-04-14 NOTE — Telephone Encounter (Signed)
Pls refer pt to cardiology for appt to see MD, pt requesting twice yearly evaluation by cardiologist for evaluation of her heart,referral is entered

## 2014-04-15 DIAGNOSIS — N184 Chronic kidney disease, stage 4 (severe): Secondary | ICD-10-CM | POA: Diagnosis not present

## 2014-04-15 DIAGNOSIS — I10 Essential (primary) hypertension: Secondary | ICD-10-CM | POA: Diagnosis not present

## 2014-04-15 DIAGNOSIS — R809 Proteinuria, unspecified: Secondary | ICD-10-CM | POA: Diagnosis not present

## 2014-04-15 DIAGNOSIS — D649 Anemia, unspecified: Secondary | ICD-10-CM | POA: Diagnosis not present

## 2014-04-15 DIAGNOSIS — M25569 Pain in unspecified knee: Secondary | ICD-10-CM | POA: Diagnosis not present

## 2014-04-16 ENCOUNTER — Encounter (HOSPITAL_COMMUNITY): Payer: Medicare Other | Attending: Hematology

## 2014-04-16 ENCOUNTER — Encounter (HOSPITAL_BASED_OUTPATIENT_CLINIC_OR_DEPARTMENT_OTHER): Payer: Medicare Other

## 2014-04-16 DIAGNOSIS — D638 Anemia in other chronic diseases classified elsewhere: Secondary | ICD-10-CM | POA: Insufficient documentation

## 2014-04-16 LAB — CBC WITH DIFFERENTIAL/PLATELET
Basophils Absolute: 0 10*3/uL (ref 0.0–0.1)
Basophils Relative: 0 % (ref 0–1)
Eosinophils Absolute: 0 10*3/uL (ref 0.0–0.7)
Eosinophils Relative: 0 % (ref 0–5)
HCT: 33.9 % — ABNORMAL LOW (ref 36.0–46.0)
Hemoglobin: 11.2 g/dL — ABNORMAL LOW (ref 12.0–15.0)
Lymphocytes Relative: 11 % — ABNORMAL LOW (ref 12–46)
Lymphs Abs: 1.1 10*3/uL (ref 0.7–4.0)
MCH: 31.3 pg (ref 26.0–34.0)
MCHC: 33 g/dL (ref 30.0–36.0)
MCV: 94.7 fL (ref 78.0–100.0)
Monocytes Absolute: 0.9 10*3/uL (ref 0.1–1.0)
Monocytes Relative: 9 % (ref 3–12)
Neutro Abs: 8.3 10*3/uL — ABNORMAL HIGH (ref 1.7–7.7)
Neutrophils Relative %: 80 % — ABNORMAL HIGH (ref 43–77)
Platelets: 208 10*3/uL (ref 150–400)
RBC: 3.58 MIL/uL — ABNORMAL LOW (ref 3.87–5.11)
RDW: 14.1 % (ref 11.5–15.5)
WBC: 10.3 10*3/uL (ref 4.0–10.5)

## 2014-04-16 NOTE — Progress Notes (Signed)
Labs drawn for cbcd 

## 2014-04-16 NOTE — Progress Notes (Signed)
Injection not needed, hemoglobin > 11.

## 2014-04-20 ENCOUNTER — Ambulatory Visit (INDEPENDENT_AMBULATORY_CARE_PROVIDER_SITE_OTHER): Payer: Medicare Other | Admitting: *Deleted

## 2014-04-20 ENCOUNTER — Other Ambulatory Visit: Payer: Self-pay | Admitting: Family Medicine

## 2014-04-20 DIAGNOSIS — Z7901 Long term (current) use of anticoagulants: Secondary | ICD-10-CM

## 2014-04-20 DIAGNOSIS — Z5181 Encounter for therapeutic drug level monitoring: Secondary | ICD-10-CM

## 2014-04-20 DIAGNOSIS — Z954 Presence of other heart-valve replacement: Secondary | ICD-10-CM

## 2014-04-20 DIAGNOSIS — Z952 Presence of prosthetic heart valve: Secondary | ICD-10-CM

## 2014-04-20 LAB — POCT INR: INR: 2.8

## 2014-05-01 ENCOUNTER — Telehealth: Payer: Self-pay

## 2014-05-01 NOTE — Telephone Encounter (Signed)
Received call from patient requesting to change care to Dr.Croitoru.Stated she still wants to have INRs done in our [place] office.

## 2014-05-07 ENCOUNTER — Encounter (HOSPITAL_COMMUNITY): Payer: Self-pay

## 2014-05-07 ENCOUNTER — Encounter (HOSPITAL_COMMUNITY): Payer: Medicare Other

## 2014-05-07 ENCOUNTER — Other Ambulatory Visit: Payer: Self-pay | Admitting: Family Medicine

## 2014-05-07 ENCOUNTER — Encounter (HOSPITAL_COMMUNITY): Payer: Medicare Other | Attending: Internal Medicine

## 2014-05-07 ENCOUNTER — Encounter (HOSPITAL_BASED_OUTPATIENT_CLINIC_OR_DEPARTMENT_OTHER): Payer: Medicare Other

## 2014-05-07 VITALS — BP 117/49 | HR 70 | Temp 98.7°F | Resp 18 | Wt 235.7 lb

## 2014-05-07 DIAGNOSIS — N183 Chronic kidney disease, stage 3 unspecified: Secondary | ICD-10-CM | POA: Diagnosis not present

## 2014-05-07 DIAGNOSIS — D638 Anemia in other chronic diseases classified elsewhere: Secondary | ICD-10-CM

## 2014-05-07 DIAGNOSIS — D594 Other nonautoimmune hemolytic anemias: Secondary | ICD-10-CM | POA: Diagnosis not present

## 2014-05-07 LAB — CBC WITH DIFFERENTIAL/PLATELET
Basophils Absolute: 0 10*3/uL (ref 0.0–0.1)
Basophils Relative: 0 % (ref 0–1)
Eosinophils Absolute: 0 10*3/uL (ref 0.0–0.7)
Eosinophils Relative: 0 % (ref 0–5)
HCT: 34.7 % — ABNORMAL LOW (ref 36.0–46.0)
Hemoglobin: 11 g/dL — ABNORMAL LOW (ref 12.0–15.0)
Lymphocytes Relative: 23 % (ref 12–46)
Lymphs Abs: 1.4 10*3/uL (ref 0.7–4.0)
MCH: 30.6 pg (ref 26.0–34.0)
MCHC: 31.7 g/dL (ref 30.0–36.0)
MCV: 96.4 fL (ref 78.0–100.0)
Monocytes Absolute: 0.4 10*3/uL (ref 0.1–1.0)
Monocytes Relative: 7 % (ref 3–12)
Neutro Abs: 4.2 10*3/uL (ref 1.7–7.7)
Neutrophils Relative %: 70 % (ref 43–77)
Platelets: 214 10*3/uL (ref 150–400)
RBC: 3.6 MIL/uL — ABNORMAL LOW (ref 3.87–5.11)
RDW: 14.7 % (ref 11.5–15.5)
WBC: 6 10*3/uL (ref 4.0–10.5)

## 2014-05-07 NOTE — Progress Notes (Signed)
1220:  [name] [name] presented for labwork. Labs per MD order drawn via Peripheral Line 23 gauge needle inserted in right antecubital  Good blood return present. Procedure without incident.  Needle removed intact. Patient tolerated procedure well.

## 2014-05-07 NOTE — Progress Notes (Signed)
[place]  OFFICE PROGRESS NOTE  [name], MD [address] [place] [address]  DIAGNOSIS: Anemia of chronic disease - Plan: CBC with Differential, Ferritin  Microangiopathic hemolytic anemia  CKD (chronic kidney disease), stage III  Chief Complaint  Patient presents with  . Microangiopathic hemolytic anemia    CURRENT THERAPY: Intermittent iron infusion for microangiopathic hemolytic anemia secondary to mechanical mitral and aortic valves with erythropoietin when appropriate.  INTERVAL HISTORY: [name] [name] 80 y.o. 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 Last ferritin on 4/10/was 1788. She is receiving receiving Procrit with last dose of 40,000 units given on 02/13/2014 when hemoglobin was 10.2. Last Feraheme infusion was 03/27/2013. Primary problem is back pain with radiation down both legs. She denies any significant constipation, diarrhea, or incontinence. She also denies any fever, night sweats, sore throat, headache, or seizures. She needs energy is not sleeping well because of pain.    MEDICAL HISTORY: Past Medical History  Diagnosis Date  . DJD (degenerative joint disease) of lumbar spine   . Chronic kidney disease, stage I   . Esophageal reflux   . Other and unspecified hyperlipidemia   . Other primary cardiomyopathies   . Obesity, unspecified   . Unspecified hypothyroidism   . DM (diabetes mellitus)   . Essential hypertension, benign   . 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 Pain in joint, pelvic region and thigh   . Insomnia, unspecified   . Iron deficiency 10/04/2011  . IDA (iron deficiency anemia)     Parenteral iron/[name]  . CHF (congestive heart failure)   . COPD (chronic obstructive pulmonary disease)   . Heart murmur   . ICD (implantable cardiac  defibrillator) in place   . Adenomatous polyp of colon 12/16/2002    Dr. [name]/[facility]  . Hyperlipemia   . Hemolysis 09/24/2012  . 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, not stated as uncontrolled(250.40); HYPERLIPIDEMIA; OBESITY; HYPERTENSION - with Cardiac Abnormalities; Non-ischemic cardiomyopathy with ICD; VARICOSE VEINS LOWER EXTREMITIES W/OTH COMPS; GERD; CHOLELITHIASIS; HIP PAIN, RIGHT; DEGENERATIVE JOINT DISEASE, SPINE; NAUSEA; Allergic rhinitis; Low back pain; Iron deficiency; Papilloma of breast; Warfarin-induced coagulopathy; Cardiorenal syndrome with renal failure; S/P AVR (aortic valve replacement); S/P MVR (mitral valve replacement); AICD (automatic cardioverter/defibrillator) present; CKD (chronic kidney disease), stage III - due to DM, HTN and cardiorenal syndrome; Chronic anticoagulation, on coumadin for Mechanical AVR and MVR; Upper leg pain, rt. from use of BSC?  ; COPD (chronic obstructive pulmonary disease); Chronic systolic heart failure; H/O adenomatous polyp of colon; High risk medication use; Routine general medical examination at a health care facility; Hemolysis; Spinal stenosis of lumbar region; OA (osteoarthritis) of knee; Rotator cuff syndrome of left shoulder; Other malaise and fatigue; Encounter for therapeutic drug monitoring; and Osteopenia on her problem list.    ALLERGIES:  is allergic to aspirin; niacin; and penicillins.  MEDICATIONS: has a current medication list which includes the following prescription(s): acetaminophen, albuterol, allopurinol, choline fenofibrate, digoxin, ezetimibe-simvastatin, fluticasone, fluticasone-salmeterol, folic acid, furosemide, gabapentin, hydrocodone-acetaminophen, isosorbide-hydralazine, klor-con m20, lanoxin, salonpas, losartan, meclizine, metolazone, ondansetron, polyethylene glycol powder, promethazine, synthroid,  temazepam, toprol xl, warfarin, methocarbamol, and centrum silver adult 50+.  SURGICAL HISTORY:  Past Surgical History  Procedure Laterality Date  .  Thyroidectomy    . Pacemaker placed  2004  . Aortic and mitral valve replacement  2001  . Right breast cyst removed      benign   . Right cataract removed      2005  . Defibrillator implanted 2006    . Cardiac valve replacement    . Cardiac catheterization    . Insert / replace / remove pacemaker    . Esophagogastroduodenoscopy  06/12/2007    Rourk- normal esophagus, small hiatal hernia, otherwise normal stomach, D1, D2  . Colonoscopy  06/12/2007    Rourk- Normal rectum/Normal colon  . Colonoscopy  12/16/02    small hemorrhoids  . Colonoscopy  06/20/2012    Procedure: COLONOSCOPY;  Surgeon: Daneil Dolin, MD;  Location: [facility];  Service: Endoscopy;  Laterality: N/A;  10:45    FAMILY HISTORY: family history includes Cancer in her mother; Diabetes in her brother; Heart disease in her brother, father, and sister.  SOCIAL HISTORY:  reports that she quit smoking about 27 years ago. Her smoking use included Cigarettes. She has a 30 pack-year smoking history. She has never used smokeless tobacco. She reports that she does not drink alcohol or use illicit drugs.  REVIEW OF SYSTEMS:  Other than that discussed above is noncontributory.  PHYSICAL EXAMINATION: ECOG PERFORMANCE STATUS: 1 - Symptomatic but completely ambulatory  Blood pressure 117/49, pulse 70, temperature 98.7 F (37.1 C), temperature source Oral, resp. rate 18, weight 235 lb 11.2 oz (106.913 kg), SpO2 96.00%.  GENERAL:alert, no distress and comfortable. Morbidly obese. SKIN: skin color, texture, turgor are normal, no rashes or significant lesions EYES: PERLA; Conjunctiva are pink and non-injected, sclera clear SINUSES: No redness or tenderness over maxillary or ethmoid sinuses OROPHARYNX:no exudate, no erythema on lips, buccal mucosa, or tongue. NECK: supple, thyroid  normal size, non-tender, without nodularity. No masses CHEST: Normal AP diameter with no breast masses. LYMPH:  no palpable lymphadenopathy in the cervical, axillary or inguinal LUNGS: clear to auscultation and percussion with normal breathing effort HEART: Regular with clicking sounds from replaced heart valves. No diastolic murmur. ABDOMEN:abdomen soft, non-tender and normal bowel sounds MUSCULOSKELETAL:no cyanosis of digits and no clubbing. Range of motion normal. Tenderness over the lumbar spine. A 2  NEURO: alert & oriented x 3 with fluent speech, no focal motor/sensory deficits   LABORATORY DATA:  Results for KALYSA, PLUMMER (MRN [id number]) as of 05/07/2014 12:32  Ref. Range 03/05/2014 14:50 03/09/2014 14:15 03/23/2014 13:48 03/26/2014 15:00 04/05/2014 13:46 04/05/2014 14:01 04/16/2014 12:31 04/20/2014 14:01  Hemoglobin Latest Range: 12.0-15.0 g/dL 11.1 (L)   11.1 (L) 10.7 (L)  11.2 (L)     Lab on 05/07/2014  Component Date Value Ref Range Status  . WBC 05/07/2014 6.0  4.0 - 10.5 K/uL Final  . RBC 05/07/2014 3.60* 3.87 - 5.11 MIL/uL Final  . Hemoglobin 05/07/2014 11.0* 12.0 - 15.0 g/dL Final  . HCT 05/07/2014 34.7* 36.0 - 46.0 % Final  . MCV 05/07/2014 96.4  78.0 - 100.0 fL Final  . MCH 05/07/2014 30.6  26.0 - 34.0 pg Final  . MCHC 05/07/2014 31.7  30.0 - 36.0 g/dL Final  . RDW 05/07/2014 14.7  11.5 - 15.5 % Final  . Platelets 05/07/2014 214  150 - 400 K/uL Final  . Neutrophils Relative % 05/07/2014 70  43 - 77 % Final  . Neutro Abs 05/07/2014 4.2  1.7 - 7.7 K/uL Final  . Lymphocytes Relative 05/07/2014 23  12 - 46 % Final  . Lymphs  Abs 05/07/2014 1.4  0.7 - 4.0 K/uL Final  . Monocytes Relative 05/07/2014 7  3 - 12 % Final  . Monocytes Absolute 05/07/2014 0.4  0.1 - 1.0 K/uL Final  . Eosinophils Relative 05/07/2014 0  0 - 5 % Final  . Eosinophils Absolute 05/07/2014 0.0  0.0 - 0.7 K/uL Final  . Basophils Relative 05/07/2014 0  0 - 1 % Final  . Basophils Absolute 05/07/2014 0.0  0.0 -  0.1 K/uL Final  Anti-coag visit on 04/20/2014  Component Date Value Ref Range Status  . INR 04/20/2014 2.8   Final  Lab on 04/16/2014  Component Date Value Ref Range Status  . WBC 04/16/2014 10.3  4.0 - 10.5 K/uL Final  . RBC 04/16/2014 3.58* 3.87 - 5.11 MIL/uL Final  . Hemoglobin 04/16/2014 11.2* 12.0 - 15.0 g/dL Final  . HCT 04/16/2014 33.9* 36.0 - 46.0 % Final  . MCV 04/16/2014 94.7  78.0 - 100.0 fL Final  . MCH 04/16/2014 31.3  26.0 - 34.0 pg Final  . MCHC 04/16/2014 33.0  30.0 - 36.0 g/dL Final  . RDW 04/16/2014 14.1  11.5 - 15.5 % Final  . Platelets 04/16/2014 208  150 - 400 K/uL Final  . Neutrophils Relative % 04/16/2014 80* 43 - 77 % Final  . Neutro Abs 04/16/2014 8.3* 1.7 - 7.7 K/uL Final  . Lymphocytes Relative 04/16/2014 11* 12 - 46 % Final  . Lymphs Abs 04/16/2014 1.1  0.7 - 4.0 K/uL Final  . Monocytes Relative 04/16/2014 9  3 - 12 % Final  . Monocytes Absolute 04/16/2014 0.9  0.1 - 1.0 K/uL Final  . Eosinophils Relative 04/16/2014 0  0 - 5 % Final  . Eosinophils Absolute 04/16/2014 0.0  0.0 - 0.7 K/uL Final  . Basophils Relative 04/16/2014 0  0 - 1 % Final  . Basophils Absolute 04/16/2014 0.0  0.0 - 0.1 K/uL Final    PATHOLOGY: No new pathology.  Urinalysis    Component Value Date/Time   COLORURINE YELLOW 03/27/2012 1201   APPEARANCEUR CLEAR 03/27/2012 1201   LABSPEC 1.018 03/27/2012 1201   PHURINE 5.5 03/27/2012 1201   GLUCOSEU NEGATIVE 03/27/2012 1201   GLUCOSEU NEG mg/dL 03/22/2010 2313   HGBUR NEGATIVE 03/27/2012 1201   HGBUR negative 05/27/2010 1122   BILIRUBINUR neg 03/11/2013 1443   BILIRUBINUR NEGATIVE 03/27/2012 1201   KETONESUR NEGATIVE 03/27/2012 1201   PROTEINUR neg 03/11/2013 1443   PROTEINUR NEGATIVE 03/27/2012 1201   UROBILINOGEN 0.2 03/11/2013 1443   UROBILINOGEN 0.2 03/27/2012 1201   NITRITE neg 03/11/2013 1443   NITRITE NEGATIVE 03/27/2012 1201   LEUKOCYTESUR Negative 03/11/2013 1443    RADIOGRAPHIC STUDIES: No results found.  ASSESSMENT:  #1. Microangiopathic  hemolytic anemia secondary to mechanical heart valves with iron loss through the urine, stable with last iron infusion given on 03/27/2013 receiving 510 mg of Feraheme. Procrit is maintain hemoglobin between 10.5 and 11.  #2. Status post aortic and mitral valve replacements.  #3. Chronic kidney disease.  #4. Increasing lower extremity edema, improved.       PLAN:  #1. Take hydrocodone half tablet every 4 hours while awake and one tablet at bedtime to control pain better. #2. CBC and Procrit every 3 weeks if appropriate. #3. Office visit with CBC and ferritin in 9 weeks.   All questions were answered. The patient knows to call the clinic with any problems, questions or concerns. We can certainly see the patient much sooner if necessary.   I spent 25  minutes counseling the patient face to face. The total time spent in the appointment was 30 minutes.    Doroteo Bradford, MD 05/07/2014 2:06 PM  DISCLAIMER:  This note was dictated with voice recognition software.  Similar sounding words can inadvertently be transcribed inaccurately and may not be corrected upon review.

## 2014-05-07 NOTE — Patient Instructions (Signed)
[facility] Discharge Instructions  RECOMMENDATIONS MADE BY THE CONSULTANT AND ANY TEST RESULTS WILL BE SENT TO YOUR REFERRING PHYSICIAN.  EXAM FINDINGS BY THE PHYSICIAN TODAY AND SIGNS OR SYMPTOMS TO REPORT TO CLINIC OR PRIMARY PHYSICIAN: Exam and findings as discussed by Dr. Barnet Glasgow. Your hemoglobin is 11 grams today and you do not need procrit.  Try taking your pain medication more often to be more comfortable. Report increased fatigue or shortness of breath.   INSTRUCTIONS/FOLLOW-UP: Follow-up with labs and procrit every 3 weeks and office visit in 9 weeks.  Thank you for choosing [facility] to provide your oncology and hematology care.  To afford each patient quality time with our providers, please arrive at least 15 minutes before your scheduled appointment time.  With your help, our goal is to use those 15 minutes to complete the necessary work-up to ensure our physicians have the information they need to help with your evaluation and healthcare recommendations.    Effective January 1st, 2014, we ask that you re-schedule your appointment with our physicians should you arrive 10 or more minutes late for your appointment.  We strive to give you quality time with our providers, and arriving late affects you and other patients whose appointments are after yours.    Again, thank you for choosing [facility].  Our hope is that these requests will decrease the amount of time that you wait before being seen by our physicians.       _____________________________________________________________  Should you have questions after your visit to [facility], please contact our office at [phone] between the hours of 8:30 a.m. and 4:30 p.m.  Voicemails left after 4:30 p.m. will not be returned until the following business day.  For prescription refill requests, have your pharmacy contact our office with your prescription refill request.     _______________________________________________________________  We hope that we have given you very good care.  You may receive a patient satisfaction survey in the mail, please complete it and return it as soon as possible.  We value your feedback!  _______________________________________________________________  Have you asked about our STAR program?  STAR stands for Survivorship Training and Rehabilitation, and this is a nationally recognized cancer care program that focuses on survivorship and rehabilitation.  Cancer and cancer treatments may cause problems, such as, pain, making you feel tired and keeping you from doing the things that you need or want to do. Cancer rehabilitation can help. Our goal is to reduce these troubling effects and help you have the best quality of life possible.  You may receive a survey from a nurse that asks questions about your current state of health.  Based on the survey results, all eligible patients will be referred to the [facility] program for an evaluation so we can better serve you!  A frequently asked questions sheet is available upon request.

## 2014-05-08 LAB — FERRITIN: Ferritin: 2266 ng/mL — ABNORMAL HIGH (ref 10–291)

## 2014-05-11 ENCOUNTER — Telehealth: Payer: Self-pay

## 2014-05-11 ENCOUNTER — Other Ambulatory Visit: Payer: Self-pay

## 2014-05-11 MED ORDER — FLUTICASONE-SALMETEROL 100-50 MCG/DOSE IN AEPB: INHALATION_SPRAY | RESPIRATORY_TRACT | Status: DC

## 2014-05-11 NOTE — Telephone Encounter (Signed)
Med refilled on 8/15 and today

## 2014-05-12 ENCOUNTER — Ambulatory Visit (INDEPENDENT_AMBULATORY_CARE_PROVIDER_SITE_OTHER): Payer: Medicare Other | Admitting: Family Medicine

## 2014-05-12 ENCOUNTER — Telehealth: Payer: Self-pay

## 2014-05-12 ENCOUNTER — Encounter: Payer: Self-pay | Admitting: Family Medicine

## 2014-05-12 VITALS — BP 118/58 | HR 88 | Resp 18 | Ht 66.0 in | Wt 237.1 lb

## 2014-05-12 DIAGNOSIS — E039 Hypothyroidism, unspecified: Secondary | ICD-10-CM

## 2014-05-12 DIAGNOSIS — I5022 Chronic systolic (congestive) heart failure: Secondary | ICD-10-CM

## 2014-05-12 DIAGNOSIS — J3089 Other allergic rhinitis: Secondary | ICD-10-CM

## 2014-05-12 DIAGNOSIS — I10 Essential (primary) hypertension: Secondary | ICD-10-CM

## 2014-05-12 DIAGNOSIS — R110 Nausea: Secondary | ICD-10-CM

## 2014-05-12 DIAGNOSIS — M25559 Pain in unspecified hip: Secondary | ICD-10-CM

## 2014-05-12 DIAGNOSIS — E1129 Type 2 diabetes mellitus with other diabetic kidney complication: Secondary | ICD-10-CM

## 2014-05-12 DIAGNOSIS — M25551 Pain in right hip: Secondary | ICD-10-CM

## 2014-05-12 DIAGNOSIS — E785 Hyperlipidemia, unspecified: Secondary | ICD-10-CM

## 2014-05-12 MED ORDER — ONDANSETRON HCL 4 MG/2ML IJ SOLN
4.0000 mg | Freq: Once | INTRAMUSCULAR | Status: AC
  Administered 2014-05-12: 4 mg via INTRAMUSCULAR

## 2014-05-12 MED ORDER — MECLIZINE HCL 12.5 MG PO TABS: 12.5000 mg | ORAL_TABLET | Freq: Two times a day (BID) | ORAL | Status: DC | PRN

## 2014-05-12 NOTE — Patient Instructions (Addendum)
F/u as before  I am concerned that you are more short of breath and recommend an evaluation by cardiology.  Pls let me know as soon as possible , what you decide to do in terms of the cardiologist you will see.  Oxygen levels are good.  No sign of "punctured lung" , bruising or swelling where you hit posterior chest   Ear drum on right is normal, but freash blood in ear canal where you scratched it.  Pls reduce hydrocodone  Use, it  makes you feel badly  Zofran today for nausea

## 2014-05-12 NOTE — Telephone Encounter (Signed)
Patient coming in for 2 work in

## 2014-05-12 NOTE — Telephone Encounter (Signed)
Work in this pm pls

## 2014-05-13 ENCOUNTER — Ambulatory Visit (INDEPENDENT_AMBULATORY_CARE_PROVIDER_SITE_OTHER): Payer: Medicare Other | Admitting: Surgery

## 2014-05-13 VITALS — BP 130/60 | HR 84 | Temp 98.0°F | Ht 67.0 in | Wt 236.2 lb

## 2014-05-13 DIAGNOSIS — D249 Benign neoplasm of unspecified breast: Secondary | ICD-10-CM | POA: Diagnosis not present

## 2014-05-13 DIAGNOSIS — D242 Benign neoplasm of left breast: Secondary | ICD-10-CM

## 2014-05-13 NOTE — Progress Notes (Signed)
  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 She has multiple medical issues, particularly cardiac so we decided to  not perform surgery but elected to follow her. She notes that she has not noticed a spontaneous discharge since her last visit here. She has noticed no change in her breasts.   ROS: No change  PE General: The patient is alert oriented and healthy-appearing Vital signs: Breasts: The breasts are symmetric in appearance. There not tender. There is no palpable mass on either side. There no visible skin, nipple, or areolar changes noted. I can elicit clear nipple discharge from both breasts. I cannot palpate any subareolar mass on either side. Lymphatics: There is no axillary or supraclavicular adenopathy noted.  Data Reviewed CLINICAL DATA: Patient has a history of a left breast subareolar  mass felt to represent most likely a papilloma. Due to the patient's  medical condition, surgical excision was not performed. Followup  evaluation.  EXAM:  DIGITAL DIAGNOSTIC bilateral MAMMOGRAM WITH CAD  ULTRASOUND left BREAST  COMPARISON: 03/12/2013, 02/28/2012, 02/22/2011.  ACR Breast Density Category c: The breast tissue is heterogeneously  dense, which may obscure small masses.  FINDINGS:  There is a stable breast parenchymal pattern present. There is no  worrisome mass, distortion, or worrisome calcification within either  breast.  Mammographic images were processed with CAD.  On physical exam, there is no discrete palpable abnormality within  the subareolar left breast. There is no nipple discharge expressed  at this time.  Ultrasound is performed, showing ectatic ducts within the subareolar  portion of the left breast with a 5 x 4 x 3 mm intraductal mass  again noted -stable when compared with the prior study.  IMPRESSION:  Stable breast parenchymal  pattern and stable left breast subareolar  intraductal mass most likely representing a small papilloma. Due to  the patient's medical condition, surgical excision was not felt to  be indicated. As result, recommend follow-up bilateral diagnostic  mammography and left breast ultrasound in 12 months.  RECOMMENDATION:  Bilateral diagnostic mammography and left breast ultrasound in 12  months.  I have discussed the findings and recommendations with the patient.  Results were also provided in writing at the conclusion of the  visit. If applicable, a reminder letter will be sent to the patient  regarding the next appointment.  BI-RADS CATEGORY 4: Suspicious.  Electronically Signed  By: Luberta Robertson M.D.  On: 03/18/2014 09:40  Assessment 1. Bilateral nipple discharge with likely intraductal papilloma on the left, stable 2. Significant medical issues including significant cardiac risk factors  Plan I think our original plan is best, that is continued followup in annual mammograms with surgery only if there is a change in the character and amount of discharge or change in the very stable lesions in the breast consistent with papillomas.She will come back for a recheck in six months, after the next mammogram

## 2014-05-13 NOTE — Patient Instructions (Signed)
Return 6 months

## 2014-05-16 ENCOUNTER — Other Ambulatory Visit: Payer: Self-pay | Admitting: Family Medicine

## 2014-05-17 NOTE — Progress Notes (Signed)
   Subjective:    Patient ID: [name] [name], female    DOB: 03-27-1932, 80 y.o.   MRN: [id number]  HPI Pt in with concerns of increased shortness of breath at rest and with minimal activity over past several weeks. She has been concerned about  limited access to cardiologist this year, seems frustrated at this point and has been making independent inquiries about transferring her care back to previous cardiology group. Denies chest pain or leg swelling. Chronically props herself on 3 to 4 pillows, no PND C/o increased nasal congestion with clear drainage for past 5 days, no fever, chills or productive cough Increased nausea, has been using more hydrocodone for pain and feels  This is adding to how badly she is feeling   Review of Systems See HPI Denies recent fever or chills. Denies sinus pressure, nasal congestion, ear pain or sore throat.  Denies chest pains, palpitations and leg swelling Denies dysuria, frequency, hesitancy or incontinence.  Denies headaches, seizures, numbness, or tingling. Denies depression, anxiety or insomnia. Denies skin break down or rash.        Objective:   Physical Exam BP 118/58  Pulse 88  Resp 18  Ht 5\' 6"  (1.676 m)  Wt 237 lb 1.3 oz (107.539 kg)  BMI 38.28 kg/m2  SpO2 93% Patient alert and oriented and in no cardiopulmonary distress.  HEENT: No facial asymmetry, EOMI,   oropharynx pink and moist.  Neck supple no JVD, no mass.  Chest: Clear to auscultation bilaterally.No crackles or wheezes  CVS: S1, S2  murmurs, no S3.Regular rate.  ABD: Soft non tender.   Ext: No edema  MS: Decreased  ROM spine, , hips and knees.  Skin: Intact, no ulcerations or rash noted.  Psych: Good eye contact, normal affect. Memory intact  anxious not  depressed appearing.  CNS: CN 2-12 intact, power,  normal throughout.no focal deficits noted.        Assessment & Plan:  NAUSEA Chronic nausea due to cholelithiasis, recently increased with excess  hydrocodone use from ortho. Advised to reduce hydrocodone, zofran administered  Chronic systolic heart failure C/o increased shortness of breath, reduced exercise tolerance and symptoms suggestive of decompensation. Pt currently in process of determining where she will continue cardiology follow up. States she has been unable to access MD as she would desire with all of her heart issues, and is not satisfied with eval by mid level Provider in place of MD. I offered to directly contact my local colleague, however, she has already started attempting to make arrangements to again transfer care back to original group and asks that I await her decision on this Exam is inconsistent with history, as afar as extent  Of decompensation is concerned, at this time  HYPERTENSION - with Cardiac Abnormalities Controlled, no change in medication   HYPOTHYROIDISM Controlled, no change in medication   Type II or unspecified type diabetes mellitus with renal manifestations, not stated as uncontrolled(250.40) Controlled, no change in medication   HYPERLIPIDEMIA Controlled, no change in medication Hyperlipidemia:Low fat diet discussed and encouraged.  Updated lab needed at/ before next visit.   HIP PAIN, RIGHT Reports use of hydrocodone from ortho, causes her to "feel badly" and she also suffers fronm chronic nausea. Reduced dependence advised  Allergic rhinitis Increased symptoms in past 5 days, use of clartitn or zyrtec advised

## 2014-05-17 NOTE — Assessment & Plan Note (Signed)
Reports use of hydrocodone from ortho, causes her to "feel badly" and she also suffers fronm chronic nausea. Reduced dependence advised

## 2014-05-17 NOTE — Assessment & Plan Note (Signed)
Controlled, no change in medication  

## 2014-05-17 NOTE — Assessment & Plan Note (Signed)
Increased symptoms in past 5 days, use of clartitn or zyrtec advised

## 2014-05-17 NOTE — Assessment & Plan Note (Signed)
Chronic nausea due to cholelithiasis, recently increased with excess hydrocodone use from ortho. Advised to reduce hydrocodone, zofran administered

## 2014-05-17 NOTE — Assessment & Plan Note (Signed)
Controlled, no change in medication Hyperlipidemia:Low fat diet discussed and encouraged.  Updated lab needed at/ before next visit.  

## 2014-05-17 NOTE — Assessment & Plan Note (Signed)
C/o increased shortness of breath, reduced exercise tolerance and symptoms suggestive of decompensation. Pt currently in process of determining where she will continue cardiology follow up. States she has been unable to access MD as she would desire with all of her heart issues, and is not satisfied with eval by mid level Provider in place of MD. I offered to directly contact my local colleague, however, she has already started attempting to make arrangements to again transfer care back to original group and asks that I await her decision on this Exam is inconsistent with history, as afar as extent  Of decompensation is concerned, at this time

## 2014-05-18 ENCOUNTER — Ambulatory Visit (INDEPENDENT_AMBULATORY_CARE_PROVIDER_SITE_OTHER): Payer: Medicare Other | Admitting: *Deleted

## 2014-05-18 DIAGNOSIS — Z5181 Encounter for therapeutic drug level monitoring: Secondary | ICD-10-CM

## 2014-05-18 DIAGNOSIS — Z954 Presence of other heart-valve replacement: Secondary | ICD-10-CM

## 2014-05-18 DIAGNOSIS — Z7901 Long term (current) use of anticoagulants: Secondary | ICD-10-CM

## 2014-05-18 DIAGNOSIS — Z952 Presence of prosthetic heart valve: Secondary | ICD-10-CM

## 2014-05-18 LAB — POCT INR: INR: 3.2

## 2014-05-20 ENCOUNTER — Telehealth: Payer: Self-pay | Admitting: Cardiovascular Disease

## 2014-05-21 ENCOUNTER — Other Ambulatory Visit: Payer: Self-pay

## 2014-05-21 ENCOUNTER — Other Ambulatory Visit: Payer: Self-pay | Admitting: *Deleted

## 2014-05-21 ENCOUNTER — Other Ambulatory Visit: Payer: Self-pay | Admitting: Family Medicine

## 2014-05-21 MED ORDER — WARFARIN SODIUM 5 MG PO TABS: ORAL_TABLET | ORAL | Status: DC

## 2014-05-22 ENCOUNTER — Other Ambulatory Visit (HOSPITAL_COMMUNITY): Payer: Self-pay

## 2014-05-25 ENCOUNTER — Other Ambulatory Visit: Payer: Self-pay | Admitting: Internal Medicine

## 2014-05-25 ENCOUNTER — Telehealth: Payer: Self-pay

## 2014-05-25 DIAGNOSIS — E039 Hypothyroidism, unspecified: Secondary | ICD-10-CM | POA: Diagnosis not present

## 2014-05-25 DIAGNOSIS — H40009 Preglaucoma, unspecified, unspecified eye: Secondary | ICD-10-CM | POA: Diagnosis not present

## 2014-05-25 DIAGNOSIS — E1129 Type 2 diabetes mellitus with other diabetic kidney complication: Secondary | ICD-10-CM | POA: Diagnosis not present

## 2014-05-25 DIAGNOSIS — E785 Hyperlipidemia, unspecified: Secondary | ICD-10-CM | POA: Diagnosis not present

## 2014-05-25 LAB — HEMOGLOBIN A1C
Hgb A1c MFr Bld: 6.4 % — ABNORMAL HIGH (ref <5.7)
Mean Plasma Glucose: 137 mg/dL — ABNORMAL HIGH (ref <117)

## 2014-05-25 NOTE — Telephone Encounter (Signed)
Spoke with patient and she is aware that request was sent to Coumadin nurse to send in refill to [facility]

## 2014-05-25 NOTE — Telephone Encounter (Signed)
Closed encounter °

## 2014-05-26 LAB — COMPLETE METABOLIC PANEL WITH GFR
ALT: 12 U/L (ref 0–35)
AST: 25 U/L (ref 0–37)
Albumin: 4.2 g/dL (ref 3.5–5.2)
Alkaline Phosphatase: 25 U/L — ABNORMAL LOW (ref 39–117)
BUN: 59 mg/dL — ABNORMAL HIGH (ref 6–23)
CO2: 30 mEq/L (ref 19–32)
Calcium: 9.8 mg/dL (ref 8.4–10.5)
Chloride: 99 mEq/L (ref 96–112)
Creat: 2.37 mg/dL — ABNORMAL HIGH (ref 0.50–1.10)
GFR, Est African American: 22 mL/min — ABNORMAL LOW
GFR, Est Non African American: 19 mL/min — ABNORMAL LOW
Glucose, Bld: 92 mg/dL (ref 70–99)
Potassium: 4.7 mEq/L (ref 3.5–5.3)
Sodium: 137 mEq/L (ref 135–145)
Total Bilirubin: 0.5 mg/dL (ref 0.2–1.2)
Total Protein: 7.6 g/dL (ref 6.0–8.3)

## 2014-05-26 LAB — LIPID PANEL
Cholesterol: 145 mg/dL (ref 0–200)
HDL: 36 mg/dL — ABNORMAL LOW (ref >39)
LDL Cholesterol: 72 mg/dL (ref 0–99)
Total CHOL/HDL Ratio: 4 Ratio
Triglycerides: 183 mg/dL — ABNORMAL HIGH (ref <150)
VLDL: 37 mg/dL (ref 0–40)

## 2014-05-26 LAB — TSH: TSH: 1.068 u[IU]/mL (ref 0.350–4.500)

## 2014-05-27 ENCOUNTER — Other Ambulatory Visit: Payer: Self-pay | Admitting: *Deleted

## 2014-05-27 ENCOUNTER — Telehealth: Payer: Self-pay | Admitting: Cardiovascular Disease

## 2014-05-27 MED ORDER — WARFARIN SODIUM 5 MG PO TABS: ORAL_TABLET | ORAL | Status: DC

## 2014-05-27 NOTE — Telephone Encounter (Signed)
Forward to Coumadin nurse

## 2014-05-27 NOTE — Telephone Encounter (Signed)
Patient needs 10 day supply of Coumadin sent in to [facility] until she can get her mail order supply. / tgs

## 2014-05-27 NOTE — Telephone Encounter (Signed)
Spoke with pt and refill to [facility] done as requested

## 2014-05-27 NOTE — Telephone Encounter (Signed)
Refill done as requested Pt is aware

## 2014-05-28 ENCOUNTER — Encounter (HOSPITAL_COMMUNITY): Payer: Self-pay

## 2014-05-28 ENCOUNTER — Encounter (HOSPITAL_COMMUNITY): Payer: Medicare Other | Attending: Internal Medicine

## 2014-05-28 ENCOUNTER — Encounter (HOSPITAL_BASED_OUTPATIENT_CLINIC_OR_DEPARTMENT_OTHER): Payer: Medicare Other

## 2014-05-28 VITALS — BP 120/51 | HR 83 | Temp 98.0°F | Resp 18

## 2014-05-28 DIAGNOSIS — D638 Anemia in other chronic diseases classified elsewhere: Secondary | ICD-10-CM

## 2014-05-28 DIAGNOSIS — N181 Chronic kidney disease, stage 1: Secondary | ICD-10-CM | POA: Diagnosis not present

## 2014-05-28 DIAGNOSIS — E611 Iron deficiency: Secondary | ICD-10-CM

## 2014-05-28 DIAGNOSIS — M25569 Pain in unspecified knee: Secondary | ICD-10-CM | POA: Diagnosis not present

## 2014-05-28 LAB — CBC WITH DIFFERENTIAL/PLATELET
Basophils Absolute: 0 10*3/uL (ref 0.0–0.1)
Basophils Relative: 0 % (ref 0–1)
Eosinophils Absolute: 0 10*3/uL (ref 0.0–0.7)
Eosinophils Relative: 0 % (ref 0–5)
HCT: 31.9 % — ABNORMAL LOW (ref 36.0–46.0)
Hemoglobin: 10.4 g/dL — ABNORMAL LOW (ref 12.0–15.0)
Lymphocytes Relative: 20 % (ref 12–46)
Lymphs Abs: 1.2 10*3/uL (ref 0.7–4.0)
MCH: 31.2 pg (ref 26.0–34.0)
MCHC: 32.6 g/dL (ref 30.0–36.0)
MCV: 95.8 fL (ref 78.0–100.0)
Monocytes Absolute: 0.5 10*3/uL (ref 0.1–1.0)
Monocytes Relative: 7 % (ref 3–12)
Neutro Abs: 4.4 10*3/uL (ref 1.7–7.7)
Neutrophils Relative %: 73 % (ref 43–77)
Platelets: 213 10*3/uL (ref 150–400)
RBC: 3.33 MIL/uL — ABNORMAL LOW (ref 3.87–5.11)
RDW: 14.7 % (ref 11.5–15.5)
WBC: 6.1 10*3/uL (ref 4.0–10.5)

## 2014-05-28 MED ORDER — EPOETIN ALFA 40000 UNIT/ML IJ SOLN
40000.0000 [IU] | Freq: Once | INTRAMUSCULAR | Status: AC
  Administered 2014-05-28: 40000 [IU] via SUBCUTANEOUS

## 2014-05-28 MED ORDER — EPOETIN ALFA 40000 UNIT/ML IJ SOLN
INTRAMUSCULAR | Status: AC
  Filled 2014-05-28: qty 1

## 2014-05-28 NOTE — Progress Notes (Signed)
Labs for cbcd 

## 2014-05-28 NOTE — Progress Notes (Signed)
Teygan Breech presents today for injection per MD orders. Procrit 40000 units administered SQ in right Abdomen. Administration without incident. Patient tolerated well.

## 2014-06-03 ENCOUNTER — Ambulatory Visit (INDEPENDENT_AMBULATORY_CARE_PROVIDER_SITE_OTHER): Payer: Medicare Other | Admitting: Family Medicine

## 2014-06-03 ENCOUNTER — Encounter: Payer: Self-pay | Admitting: Family Medicine

## 2014-06-03 VITALS — BP 132/66 | HR 88 | Resp 18 | Ht 66.0 in | Wt 235.1 lb

## 2014-06-03 DIAGNOSIS — E611 Iron deficiency: Secondary | ICD-10-CM

## 2014-06-03 DIAGNOSIS — Z23 Encounter for immunization: Secondary | ICD-10-CM | POA: Diagnosis not present

## 2014-06-03 DIAGNOSIS — E039 Hypothyroidism, unspecified: Secondary | ICD-10-CM | POA: Diagnosis not present

## 2014-06-03 DIAGNOSIS — I1310 Hypertensive heart and chronic kidney disease without heart failure, with stage 1 through stage 4 chronic kidney disease, or unspecified chronic kidney disease: Secondary | ICD-10-CM

## 2014-06-03 DIAGNOSIS — E785 Hyperlipidemia, unspecified: Secondary | ICD-10-CM

## 2014-06-03 DIAGNOSIS — R110 Nausea: Secondary | ICD-10-CM

## 2014-06-03 DIAGNOSIS — I10 Essential (primary) hypertension: Secondary | ICD-10-CM

## 2014-06-03 DIAGNOSIS — D509 Iron deficiency anemia, unspecified: Secondary | ICD-10-CM

## 2014-06-03 DIAGNOSIS — E1129 Type 2 diabetes mellitus with other diabetic kidney complication: Secondary | ICD-10-CM

## 2014-06-03 MED ORDER — ALBUTEROL SULFATE HFA 108 (90 BASE) MCG/ACT IN AERS: 2.0000 | INHALATION_SPRAY | Freq: Four times a day (QID) | RESPIRATORY_TRACT | Status: DC | PRN

## 2014-06-03 NOTE — Patient Instructions (Signed)
F/u in 4 month, call if you need me before  Blood pressure, LDL and blood sugar are excellent , you get certificate fior that!  Flu vaccine today   Cut back on cheese, egg yoplks and butter   Fasting lipid, cmp and EGFR, and HBA1C , and tSH in 4 month pls

## 2014-06-03 NOTE — Assessment & Plan Note (Signed)
Treated by hematology

## 2014-06-03 NOTE — Assessment & Plan Note (Signed)
Followed by nephrology in [place]

## 2014-06-03 NOTE — Assessment & Plan Note (Signed)
Elevated tG, needs to reduce cheese and egg yolks

## 2014-06-03 NOTE — Assessment & Plan Note (Signed)
Controlled, no change in medication  

## 2014-06-03 NOTE — Assessment & Plan Note (Signed)
Controlled, no change in medication Has upcoming appt with cardiology

## 2014-06-03 NOTE — Assessment & Plan Note (Signed)
Vaccine administered at visit.  

## 2014-06-03 NOTE — Assessment & Plan Note (Signed)
Controlled, continue dietary management only

## 2014-06-04 ENCOUNTER — Other Ambulatory Visit: Payer: Self-pay

## 2014-06-04 MED ORDER — GABAPENTIN 300 MG PO CAPS: 300.0000 mg | ORAL_CAPSULE | Freq: Every day | ORAL | Status: DC

## 2014-06-04 MED ORDER — ALLOPURINOL 100 MG PO TABS: 100.0000 mg | ORAL_TABLET | Freq: Every day | ORAL | Status: DC

## 2014-06-07 NOTE — Assessment & Plan Note (Signed)
chronic due to cholelithiasis, uses zofran as needed

## 2014-06-07 NOTE — Progress Notes (Signed)
   Subjective:    Patient ID: [name] [name], female    DOB: 12-16-1931, 80 y.o.   MRN: [id number]  HPI The PT is here for follow up and re-evaluation of chronic medical conditions, medication management and review of any available recent lab and radiology data.  Preventive health is updated, specifically  Cancer screening and Immunization.   Questions or concerns regarding consultations or procedures which the PT has had in the interim are  Addressed.Recently saw hematology and oncology and has upcoming appt with cardiology in [place] with her old group The PT denies any adverse reactions to current medications since the last visit.  There are no new concerns.  There are no specific complaints       Review of Systems See HPI Denies recent fever or chills. Denies sinus pressure, nasal congestion, ear pain or sore throat. Denies chest congestion, productive cough or wheezing. Denies chest pains, and leg swelling, does have few palpitations,uses 2 pillows Denies abdominal pain, , vomiting,diarrhea or constipation.Chronic nausea   Denies dysuria, frequency, hesitancy or incontinence. Chronic  joint pain,  and limitation in mobility. Denies headaches, seizures, numbness, or tingling. Denies depression, anxiety or insomnia. Denies skin break down or rash.        Objective:   Physical Exam BP 132/66  Pulse 88  Resp 18  Ht 5\' 6"  (1.676 m)  Wt 235 lb 1.9 oz (106.65 kg)  BMI 37.97 kg/m2  SpO2 99%  Patient alert and oriented and in no cardiopulmonary distress.  HEENT: No facial asymmetry, EOMI,   oropharynx pink and moist.  Neck adequate ROM, though reduced, no JVD, no mass.  Chest: Clear to auscultation bilaterally.No crackles or wheezes  CVS: S1, S2  Systolic  Murmur and click, no S3.Regular rate.  ABD: Soft non tender.   Ext: No edema  MS: decreased  ROM spine, shoulders, hips and knees.  Skin: Intact, no ulcerations or rash noted.  Psych: Good eye contact,  normal affect. Memory intact not anxious or depressed appearing.  CNS: CN 2-12 intact, power,  normal throughout.no focal deficits noted.         Assessment & Plan:  HYPERTENSION - with Cardiac Abnormalities Controlled, no change in medication Has upcoming appt with cardiology  Cardiorenal syndrome with renal failure Followed by nephrology in [place] Controlled, no change in medication   HYPERLIPIDEMIA Elevated tG, needs to reduce cheese and egg yolks  Iron deficiency Treated by hematology  Type II or unspecified type diabetes mellitus with renal manifestations, not stated as uncontrolled(250.40) Controlled, continue dietary management only  Need for prophylactic vaccination and inoculation against influenza Vaccine administered at visit.   NAUSEA chronic due to cholelithiasis, uses zofran as needed

## 2014-06-09 ENCOUNTER — Other Ambulatory Visit: Payer: Self-pay | Admitting: *Deleted

## 2014-06-09 MED ORDER — CHOLINE FENOFIBRATE 135 MG PO CPDR: 135.0000 mg | DELAYED_RELEASE_CAPSULE | Freq: Every day | ORAL | Status: DC

## 2014-06-10 NOTE — Assessment & Plan Note (Signed)
Uncontrolled with excessive cough and chest congestrion, daily medication for allergies to be used

## 2014-06-10 NOTE — Assessment & Plan Note (Signed)
Controlled, no change in medication  

## 2014-06-10 NOTE — Assessment & Plan Note (Signed)
Uncontrolled with flare in symptoms, no bacterial superinfection, symptomatic treatment with the addition of decongestants only, call if worsens

## 2014-06-10 NOTE — Progress Notes (Signed)
   Subjective:    Patient ID: [name] [name], female    DOB: 1932/02/16, 80 y.o.   MRN: [id number]  HPI Pt in with main c/o increased chest congestion and cough productive of white sputum. No fever or chills , nasal drainage is clear and somewhat excessive Very concerned about  Her heart, feels as though not getting the same type of care , and seeing an MD on as regular a basis as when she was followed by the [facility]. Denies PND orthopnea or leg swelling does have intermittent chest pain, and was in the Ed about 2 weeks ago with this complaint   Review of Systems See HPI Denies recent fever or chills. Denies abdominal pain, nausea, vomiting,diarrhea or constipation.   Denies dysuria, frequency, hesitancy or incontinence. Chronic joint pain, swelling and limitation in mobility. Denies headaches, seizures, numbness, or tingling. Denies depression, does report increase  anxiety and mild  insomnia. Denies skin break down or rash.        Objective:   Physical Exam BP 122/62  Pulse 86  Resp 18  Ht 5\' 6"  (1.676 m)  Wt 235 lb (106.595 kg)  BMI 37.95 kg/m2  SpO2 92% Patient alert and oriented and in no cardiopulmonary distress.  HEENT: No facial asymmetry, EOMI,   oropharynx pink and moist.  Neck supple no JVD, no mass. No sinus tenderness, TM clear Chest: Adequate iar entry .No wheezes scattered bibasilar crackles  CVS: S1, S2  Systolic murmur  And click, no S3.Regular rate.  ABD: Soft non tender.   Ext: No edema  MS: Adequate ROM spine, shoulders, hips and knees.  Skin: Intact, no ulcerations or rash noted.  Psych: Good eye contact, normal affect. Memory intact not anxious or depressed appearing.  CNS: CN 2-12 intact, power,  normal throughout.no focal deficits noted.        Assessment & Plan:  Allergic rhinitis Uncontrolled with excessive cough and chest congestrion, daily medication for allergies to be used  COPD (chronic obstructive pulmonary  disease) Uncontrolled with flare in symptoms, no bacterial superinfection, symptomatic treatment with the addition of decongestants only, call if worsens  Other malaise and fatigue Worsening fatigue and exertion, pt has concerns about her heart , requests re eval by cardiologist, no sign or  symptoms of decompensated heart failure at this visit of note  Chronic systolic heart failure satble with no sign s of decompensation at thsi visit, advised pt to continue  To restrict both sodium and fluid intake and to weigh daily Will attempt to get cardiology appt based o pt request  HYPERTENSION - with Cardiac Abnormalities Controlled, no change in medication   HYPOTHYROIDISM Controlled, no change in medication   Type II or unspecified type diabetes mellitus with renal manifestations, not stated as uncontrolled(250.40) Controlled, no change in medication

## 2014-06-10 NOTE — Assessment & Plan Note (Signed)
Worsening fatigue and exertion, pt has concerns about her heart , requests re eval by cardiologist, no sign or  symptoms of decompensated heart failure at this visit of note

## 2014-06-10 NOTE — Assessment & Plan Note (Signed)
satble with no sign s of decompensation at thsi visit, advised pt to continue  To restrict both sodium and fluid intake and to weigh daily Will attempt to get cardiology appt based o pt request

## 2014-06-11 ENCOUNTER — Other Ambulatory Visit (HOSPITAL_COMMUNITY): Payer: Self-pay

## 2014-06-11 DIAGNOSIS — N183 Chronic kidney disease, stage 3 unspecified: Secondary | ICD-10-CM

## 2014-06-11 DIAGNOSIS — D649 Anemia, unspecified: Secondary | ICD-10-CM

## 2014-06-11 DIAGNOSIS — E611 Iron deficiency: Secondary | ICD-10-CM

## 2014-06-15 DIAGNOSIS — M79609 Pain in unspecified limb: Secondary | ICD-10-CM | POA: Diagnosis not present

## 2014-06-15 DIAGNOSIS — B351 Tinea unguium: Secondary | ICD-10-CM | POA: Diagnosis not present

## 2014-06-15 DIAGNOSIS — L609 Nail disorder, unspecified: Secondary | ICD-10-CM | POA: Diagnosis not present

## 2014-06-15 DIAGNOSIS — E1149 Type 2 diabetes mellitus with other diabetic neurological complication: Secondary | ICD-10-CM | POA: Diagnosis not present

## 2014-06-18 ENCOUNTER — Encounter (HOSPITAL_COMMUNITY): Payer: Medicare Other

## 2014-06-18 ENCOUNTER — Encounter (HOSPITAL_BASED_OUTPATIENT_CLINIC_OR_DEPARTMENT_OTHER): Payer: Medicare Other

## 2014-06-18 ENCOUNTER — Encounter (HOSPITAL_COMMUNITY): Payer: Medicare Other | Attending: Hematology

## 2014-06-18 VITALS — BP 147/51 | HR 85 | Temp 97.9°F | Resp 18

## 2014-06-18 DIAGNOSIS — D638 Anemia in other chronic diseases classified elsewhere: Secondary | ICD-10-CM

## 2014-06-18 DIAGNOSIS — N183 Chronic kidney disease, stage 3 unspecified: Secondary | ICD-10-CM

## 2014-06-18 DIAGNOSIS — E611 Iron deficiency: Secondary | ICD-10-CM

## 2014-06-18 DIAGNOSIS — N181 Chronic kidney disease, stage 1: Secondary | ICD-10-CM

## 2014-06-18 DIAGNOSIS — D649 Anemia, unspecified: Secondary | ICD-10-CM

## 2014-06-18 LAB — CBC WITH DIFFERENTIAL/PLATELET
Basophils Absolute: 0 10*3/uL (ref 0.0–0.1)
Basophils Relative: 0 % (ref 0–1)
Eosinophils Absolute: 0 10*3/uL (ref 0.0–0.7)
Eosinophils Relative: 0 % (ref 0–5)
HCT: 31.9 % — ABNORMAL LOW (ref 36.0–46.0)
Hemoglobin: 10.5 g/dL — ABNORMAL LOW (ref 12.0–15.0)
Lymphocytes Relative: 19 % (ref 12–46)
Lymphs Abs: 1 10*3/uL (ref 0.7–4.0)
MCH: 31.4 pg (ref 26.0–34.0)
MCHC: 32.9 g/dL (ref 30.0–36.0)
MCV: 95.5 fL (ref 78.0–100.0)
Monocytes Absolute: 0.4 10*3/uL (ref 0.1–1.0)
Monocytes Relative: 7 % (ref 3–12)
Neutro Abs: 3.9 10*3/uL (ref 1.7–7.7)
Neutrophils Relative %: 74 % (ref 43–77)
Platelets: 213 10*3/uL (ref 150–400)
RBC: 3.34 MIL/uL — ABNORMAL LOW (ref 3.87–5.11)
RDW: 14.6 % (ref 11.5–15.5)
WBC: 5.3 10*3/uL (ref 4.0–10.5)

## 2014-06-18 MED ORDER — EPOETIN ALFA 40000 UNIT/ML IJ SOLN
40000.0000 [IU] | Freq: Once | INTRAMUSCULAR | Status: AC
  Administered 2014-06-18: 40000 [IU] via SUBCUTANEOUS

## 2014-06-18 MED ORDER — EPOETIN ALFA 40000 UNIT/ML IJ SOLN
INTRAMUSCULAR | Status: AC
  Filled 2014-06-18: qty 1

## 2014-06-18 NOTE — Progress Notes (Signed)
[name] [name] presents today for injection per the provider's orders.  Procrit administration without incident; see MAR for injection details.  Patient tolerated procedure well and without incident.  No questions or complaints noted at this time. 

## 2014-06-18 NOTE — Progress Notes (Signed)
LABS FOR FERR,CBCD 

## 2014-06-19 LAB — FERRITIN: Ferritin: 1567 ng/mL — ABNORMAL HIGH (ref 10–291)

## 2014-06-24 ENCOUNTER — Ambulatory Visit (INDEPENDENT_AMBULATORY_CARE_PROVIDER_SITE_OTHER): Payer: Medicare Other | Admitting: Cardiovascular Disease

## 2014-06-24 ENCOUNTER — Encounter: Payer: Self-pay | Admitting: Cardiovascular Disease

## 2014-06-24 VITALS — BP 126/70 | HR 90 | Resp 16 | Ht 67.0 in | Wt 240.1 lb

## 2014-06-24 DIAGNOSIS — I5022 Chronic systolic (congestive) heart failure: Secondary | ICD-10-CM | POA: Diagnosis not present

## 2014-06-24 DIAGNOSIS — Z95810 Presence of automatic (implantable) cardiac defibrillator: Secondary | ICD-10-CM

## 2014-06-24 DIAGNOSIS — D649 Anemia, unspecified: Secondary | ICD-10-CM | POA: Insufficient documentation

## 2014-06-24 DIAGNOSIS — Z952 Presence of prosthetic heart valve: Secondary | ICD-10-CM

## 2014-06-24 DIAGNOSIS — I428 Other cardiomyopathies: Secondary | ICD-10-CM

## 2014-06-24 DIAGNOSIS — Z954 Presence of other heart-valve replacement: Secondary | ICD-10-CM

## 2014-06-24 MED ORDER — EZETIMIBE-SIMVASTATIN 10-20 MG PO TABS: 1.0000 | ORAL_TABLET | ORAL | Status: DC

## 2014-06-24 NOTE — Assessment & Plan Note (Addendum)
Device size unknown. Reportedly a St. Jude valve. Gradients were slightly higher than expected on her last echocardiogram, but both the patient's heart rates and hemoglobin level could have a big impact on this. Unfortunately these were not documented in the echo report. It is reasonable to reevaluate her echocardiogram, to make sure she does not have evidence of prosthetic valve stenosis.

## 2014-06-24 NOTE — Progress Notes (Signed)
Patient ID: [name] [name], female   DOB: 1932-01-26, 80 y.o.   MRN: [id number]      Reason for office visit ICD followup, valvular heart disease, congestive heart failure, nonischemic cardiomyopathy  [name] [name] was a long-term patient of Dr. Terance Ice and saw Dr. Lovena Le in the refill device clinic once. She wants to consolidate her cardiac followup in one office.  Has a complex history of cardiac problems. She has undergone aortic and mitral valve replacements as 2 separate surgeries in 2001 in 2004 in [place]. Both prostheses are St. Jude's mechanical valves. Have not been able to secure any records regarding the size of these valves  Severe cardiomyopathy with left ventricular ejection fraction of 20-25%, which has been stable at this level at least 2004. Coronary angiography which was performed in 2001 before her first valve replacement showed normal coronaries (at that time LVEF was 60%).  Her most recent echocardiogram was performed in June of 2014. LVEF was 25-30%. Aortic prosthesis gradients were peak 25 and mean 15 mm Hg. Mitral valve prosthesis gradients were peak 15 and mean 8 mm Hg (heart rate not documented) in the mitral pressure half-time was 100 ms  In 2004 she also had complete heart block and received a dual chamber pacemaker in 2004. In 2006 she was upgraded to a dual-chamber a defibrillator. An attempt was made to upgrade the device to a biventricular ICD in 2006, but a coronary sinus lead could not be placed because of tortuosity of the brachiocephalic system and subclavian vein stenosis. She also had a sprint fidelis lead under advisory and a new defibrillator lead, Sprint Quattro secure was placed in 2007. On her chest x-ray she still has 2 defibrillator leads as well as a right ventricular pacemaker leads (3 ventricular leads are present) as well as an atrial lead. He was offered to upgrade to a biventricular device in 2011, but reportedly declined so she had a  EOL generator change performed by Dr. Rosita Fire at that time.  Her last device check in July 2015 and again today both showed normal function. She is pacemaker dependent due to complete heart block. Her current device is a Medtronic secura implanted in March 2011. Her atrial lead is a Medtronic [id number] implanted in May 2004. Her right ventricular lead is a Medtronic [id number] implanted in December 2007. She requires 100% ventricular pacing there is no underlying escape rhythm. She also has roughly 77% atrial pacing. There has been no evidence of ventricular or atrial tachyarrhythmia. To my knowledge has never received therapy from any of her ICPs. Heart rate histograms show very blunted the distribution she is very inactive spending less than 30 minutes a day moving.  Optivol thoracic impedance monitoring is currently in the normal range. She appears to be hypervolemic in July/August of this year. Was seen in the emergency room around that time.  Lower extremity arterial Dopplers were normal in 2013. Venous Doppler of the right lower extremity did not show DVT in 2013.  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 She has diabetes mellitus and hyperlipidemia and hyperuricemia.    Allergies  Allergen Reactions  . Aspirin     Chronic coumadin  . Niacin Itching  . Penicillins Other (See Comments)    Bruises     Current Outpatient Prescriptions  Medication Sig Dispense Refill  . acetaminophen (TYLENOL) 325 MG tablet Take 325-650 mg by mouth every 6 (six) hours  as needed for mild pain, moderate pain or headache.      . albuterol (PROVENTIL HFA;VENTOLIN HFA) 108 (90 BASE) MCG/ACT inhaler Inhale 2 puffs into the lungs every 6 (six) hours as needed for wheezing or shortness of breath.  1 Inhaler  3  . albuterol (PROVENTIL) (2.5 MG/3ML) 0.083% nebulizer solution Take 2.5 mg by nebulization 2 (two) times daily as needed for wheezing or  shortness of breath.      . allopurinol (ZYLOPRIM) 100 MG tablet Take 1 tablet (100 mg total) by mouth daily.  90 tablet  1  . BIDIL 20-37.5 MG per tablet Take 1 tablet by mouth two  times daily  180 tablet  0  . Choline Fenofibrate (TRILIPIX) 135 MG capsule Take 1 capsule (135 mg total) by mouth daily.  90 capsule  1  . digoxin (LANOXIN) 0.125 MG tablet Take 0.125 mg by mouth every morning.       . ezetimibe-simvastatin (VYTORIN) 10-20 MG per tablet Take 1 tablet by mouth every Monday, Wednesday, and Friday.  21 tablet  0  . fluticasone (FLONASE) 50 MCG/ACT nasal spray Place 2 sprays into both nostrils daily as needed for allergies.      . Fluticasone-Salmeterol (ADVAIR DISKUS) 100-50 MCG/DOSE AEPB inhale 1 dose by mouth twice a day  60 each  4  . folic acid (FOLVITE) 1 MG tablet take 1 tablet by mouth once daily  100 tablet  6  . furosemide (LASIX) 40 MG tablet Take 40 mg by mouth daily.      Marland Kitchen gabapentin (NEURONTIN) 300 MG capsule Take 1 capsule (300 mg total) by mouth at bedtime.  90 capsule  1  . HYDROcodone-acetaminophen (NORCO/VICODIN) 5-325 MG per tablet Take 0.5 tablets by mouth every 6 (six) hours as needed for pain.       Marland Kitchen KLOR-CON M20 20 MEQ tablet Take 1 tablet by mouth  daily  90 tablet  0  . LANOXIN 62.5 MCG TABS Take 1 tablet by mouth  every day  90 tablet  0  . Liniments (SALONPAS) PADS Apply 1 each topically as needed.      Marland Kitchen losartan (COZAAR) 50 MG tablet Take 50 mg by mouth daily.      . meclizine (ANTIVERT) 12.5 MG tablet Take 1 tablet (12.5 mg total) by mouth 2 (two) times daily as needed for dizziness.  30 tablet  2  . methocarbamol (ROBAXIN) 500 MG tablet Take 1 tablet (500 mg total) by mouth 3 (three) times daily.  30 tablet  0  . metolazone (ZAROXOLYN) 2.5 MG tablet Take 2.5 mg by mouth once a week. Uses on Wednesday      . Multiple Vitamins-Minerals (CENTRUM SILVER ADULT 50+) TABS Take 1 tablet by mouth once a week.      . ondansetron (ZOFRAN) 4 MG tablet Take 4 mg by  mouth daily as needed for nausea.      . polyethylene glycol powder (MIRALAX) powder Take 17 g by mouth daily as needed for mild constipation.       Marland Kitchen SYNTHROID 75 MCG tablet Take 1 tablet by mouth  daily  90 tablet  0  . temazepam (RESTORIL) 30 MG capsule Take 30 mg by mouth at bedtime as needed for sleep.      . TOPROL XL 100 MG 24 hr tablet Take 1 tablet by mouth  daily with or immediately  following a meal  90 tablet  0  . warfarin (COUMADIN) 5 MG tablet Take as  directed by coumadin clinic  10 tablet  0   No current facility-administered medications for this visit.    Past Medical History  Diagnosis Date  . DJD (degenerative joint disease) of lumbar spine   . Chronic kidney disease, stage I   . Esophageal reflux   . Other and unspecified hyperlipidemia   . Other primary cardiomyopathies   . Obesity, unspecified   . Unspecified hypothyroidism   . DM (diabetes mellitus)   . Essential hypertension, benign   . 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 Pain in joint, pelvic region and thigh   . Insomnia, unspecified   . Iron deficiency 10/04/2011  . IDA (iron deficiency anemia)     Parenteral iron/[name]  . CHF (congestive heart failure)   . COPD (chronic obstructive pulmonary disease)   . Heart murmur   . ICD (implantable cardiac defibrillator) in place   . Adenomatous polyp of colon 12/16/2002    Dr. [name]/[facility]  . Hyperlipemia   . Hemolysis 09/24/2012  . Cancer     Past Surgical History  Procedure Laterality Date  . Thyroidectomy    . Pacemaker placed  2004  . Aortic and mitral valve replacement  2001  . Right breast cyst removed      benign   . Right cataract removed      2005  . Defibrillator implanted 2006    . Cardiac valve replacement    . Cardiac catheterization    . Insert / replace / remove pacemaker    . Esophagogastroduodenoscopy  06/12/2007    Rourk- normal esophagus, small hiatal  hernia, otherwise normal stomach, D1, D2  . Colonoscopy  06/12/2007    Rourk- Normal rectum/Normal colon  . Colonoscopy  12/16/02    small hemorrhoids  . Colonoscopy  06/20/2012    Procedure: COLONOSCOPY;  Surgeon: Daneil Dolin, MD;  Location: [facility];  Service: Endoscopy;  Laterality: N/A;  10:45    Family History  Problem Relation Age of Onset  . Cancer Mother     behind pancres  . Heart disease Father   . Heart disease Sister     Heart attack  . Heart disease Brother   . Diabetes Brother     History   Social History  . Marital Status: Widowed    Spouse Name: N/A    Number of Children: 0  . Years of Education: N/A   Occupational History  . retired    Social History Main Topics  . Smoking status: Former Smoker -- 0.75 packs/day for 40 years    Types: Cigarettes    Quit date: 03/08/1987  . Smokeless tobacco: Never Used  . Alcohol Use: No  . Drug Use: No  . Sexual Activity: Not on file   Other Topics Concern  . Not on file   Social History Narrative   From [place] (2004)    Review of systems: She has chronic dyspnea, likely functional class 2-3 exertional dyspnea. She does not become short of breath dressing but does when she tries to make the bed or perform housework. This is unchanged for many years. She used to weigh herself on a daily basis but is no longer doing that. She does not have edema of the lower extremities, at least not recently. She denies chest pain. She does not have orthopnea or PND and has not felt palpitations. She has not had recent bleeding complications and denies a history  of stroke/TIA.  PHYSICAL EXAM BP 126/70  Pulse 90  Resp 16  Ht 5\' 7"  (1.702 m)  Wt 108.909 kg (240 lb 1.6 oz)  BMI 37.60 kg/m2  General: Alert, oriented x3, no distress, moderate to severely obese Head: no evidence of trauma, PERRL, EOMI, no exophtalmos or lid lag, no myxedema, no xanthelasma; normal ears, nose and oropharynx Neck: normal jugular venous  pulsations and no hepatojugular reflux; brisk carotid pulses without delay and no carotid bruits Chest: clear to auscultation, no signs of consolidation by percussion or palpation, normal fremitus, symmetrical and full respiratory excursions. At the left subclavian pacemaker site shows 2 scars that are both well-healed. There are large distended collateral veins overlying the left shoulder the entire left anterior chest and the left cervical area consistent with an occluded left subclavian/brachiocephalic vein Cardiovascular: normal position and quality of the apical impulse, regular rhythm, very crisp prosthetic valve sounds, grade 2/6 systolic ejection murmur in the aortic focus, no diastolic rumble or murmur ,  No rubs or gallops Abdomen: no tenderness or distention, no masses by palpation, no abnormal pulsatility or arterial bruits, normal bowel sounds, no hepatosplenomegaly Extremities: no clubbing, cyanosis or edema; 2+ radial, ulnar and brachial pulses bilaterally; 2+ right femoral, posterior tibial and dorsalis pedis pulses; 2+ left femoral, posterior tibial and dorsalis pedis pulses; no subclavian or femoral bruits Neurological: grossly nonfocal   EKG: AV sequential pacing, QRS 194 ms  Lipid Panel     Component Value Date/Time   CHOL 145 05/25/2014 1220   TRIG 183* 05/25/2014 1220   HDL 36* 05/25/2014 1220   CHOLHDL 4.0 05/25/2014 1220   VLDL 37 05/25/2014 1220   LDLCALC 72 05/25/2014 1220    BMET    Component Value Date/Time   NA 137 05/25/2014 1220   K 4.7 05/25/2014 1220   CL 99 05/25/2014 1220   CO2 30 05/25/2014 1220   GLUCOSE 92 05/25/2014 1220   BUN 59* 05/25/2014 1220   CREATININE 2.37* 05/25/2014 1220   CREATININE 2.43* 04/05/2014 1346   CALCIUM 9.8 05/25/2014 1220   CALCIUM 9.5 06/12/2010 0459   GFRNONAA 19* 05/25/2014 1220   GFRNONAA 18* 04/05/2014 1346   GFRAA 22* 05/25/2014 1220   GFRAA 21* 04/05/2014 1346     ASSESSMENT AND PLAN Non-ischemic cardiomyopathy with ICD The  patient has severe nonischemic cardiomyopathy, likely secondary to valvular heart disease. Her left ventricular systolic function has been stable for the last many years. She is on appropriate vasodilator and beta blocker therapy. She is also on digoxin, which needs to be used with great caution because of renal insufficiency.  Chronic systolic heart failure She appears fairly stable NYHA class II-III heart failure and as far as I can tell today he is euvolemic. Use of diuretics has to be cautious because she also has fairly significant renal failure. Her device thoracic impedance recording suggests that she had hypervolemia in July and August, but this has now returned to baseline. She is on angiotensin receptor blockers despite her advanced kidney disease.  AICD (automatic cardioverter/defibrillator) approaching ERI Mrs. Grubich has had numerous interventions via her left subclavian pocket. Her pacemaker was implanted in 2004. This was upgraded to a dual-chamber defibrillator in 2006, but she had to undergo re\re intervention in 2007 to place a second defibrillator lead (Sprint Fidelis [phone] recall). 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 The current device is  functioning normally, but the generator is approaching ERI. She benefit clinically from a grade to a cardiac resynchronization device. This cannot be easily done. She has had 2 previous open heart surgeries which make placement of an epicardial lead via surgical approach more challenging. There is no option to place another endocardial lead from the left side. The only option would be to place an entirely new system via the right subclavian approach. The risks of such a procedure would be substantial, since she has 2 prosthetic valves and would be at increased risk of bleeding or stroke. In addition, the potential for infection  could have catastrophic consequences and the settings of 2 mechanical valves in a patient that is pacemaker dependent. Personally, I think the best approach would be to simply perform a generator change out. Consider discussing with electrophysiology.  S/P MVR (mitral valve replacement) Device size unknown. Reportedly a St. Jude valve. Gradients were slightly higher than expected on her last echocardiogram, but both the patient's heart rates and hemoglobin level could have a big impact on this. Unfortunately these were not documented in the echo report. It is reasonable to reevaluate her echocardiogram, to make sure she does not have evidence of prosthetic valve stenosis.  S/P AVR (aortic valve replacement) Device size unknown. Reportedly a St. Jude valve.  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 Anemia will be poorly tolerated due to the presence of a mechanical mitral valve with increased gradients and left ventricular systolic failure.   No orders of the defined types were placed in this encounter.   Meds ordered this encounter  Medications  . ezetimibe-simvastatin (VYTORIN) 10-20 MG per tablet    Sig: Take 1 tablet by mouth every Monday, Wednesday, and Friday.    Dispense:  21 tablet    Refill:  0    Lot [id number] exp: 01/2015    Holli Humbles, MD, [facility] [phone] office [phone] pager

## 2014-06-24 NOTE — Assessment & Plan Note (Signed)
The patient has severe nonischemic cardiomyopathy, likely secondary to valvular heart disease. Her left ventricular systolic function has been stable for the last many years. She is on appropriate vasodilator and beta blocker therapy. She is also on digoxin, which needs to be used with great caution because of renal insufficiency.

## 2014-06-24 NOTE — Assessment & Plan Note (Signed)
She appears fairly stable NYHA class II-III heart failure and as far as I can tell today he is euvolemic. Use of diuretics has to be cautious because she also has fairly significant renal failure. Her device thoracic impedance recording suggests that she had hypervolemia in July and August, but this has now returned to baseline. She is on angiotensin receptor blockers despite her advanced kidney disease.

## 2014-06-24 NOTE — Patient Instructions (Signed)
Your physician has requested that you have an echocardiogram. Echocardiography is a painless test that uses sound waves to create images of your heart. It provides your doctor with information about the size and shape of your heart and how well your heart's chambers and valves are working. This procedure takes approximately one hour. There are no restrictions for this procedure.  Dr. Sallyanne Kuster recommends that you schedule a follow-up appointment in: One Month.   PLEASE CALL [phone] ASK FOR Chandler Swiderski AND HAVE YOUR MEDICATIONS IN FRONT OF YOU AND WE WILL GO OVER ALL YOUR MEDS.

## 2014-06-24 NOTE — Assessment & Plan Note (Signed)
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 Anemia will be poorly tolerated due to the presence of a mechanical mitral valve with increased gradients and left ventricular systolic failure.

## 2014-06-24 NOTE — Assessment & Plan Note (Signed)
Device size unknown. Reportedly a [employer] valve.

## 2014-06-24 NOTE — Assessment & Plan Note (Signed)
[name] [name] has had numerous interventions via her left subclavian pocket. Her pacemaker was implanted in 2004. This was upgraded to a dual-chamber defibrillator in 2006, but she had to undergo re\re intervention in 2007 to place a second defibrillator lead ([employer] [phone] recall). 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 The current device is functioning normally, but the generator is approaching ERI. She benefit clinically from a grade to a cardiac resynchronization device. This cannot be easily done. She has had 2 previous open heart surgeries which make placement of an epicardial lead via surgical approach more challenging. There is no option to place another endocardial lead from the left side. The only option would be to place an entirely new system via the right subclavian approach. The risks of such a procedure would be substantial, since she has 2 prosthetic valves and would be at increased risk of bleeding or stroke. In addition, the potential for infection could have catastrophic consequences and the settings of 2 mechanical valves in a patient that is pacemaker dependent. Personally, I think the best approach would be to simply perform a generator change out. Consider discussing with electrophysiology.

## 2014-06-25 ENCOUNTER — Telehealth: Payer: Self-pay | Admitting: *Deleted

## 2014-06-25 ENCOUNTER — Ambulatory Visit (INDEPENDENT_AMBULATORY_CARE_PROVIDER_SITE_OTHER): Payer: Medicare Other | Admitting: *Deleted

## 2014-06-25 DIAGNOSIS — Z954 Presence of other heart-valve replacement: Secondary | ICD-10-CM | POA: Diagnosis not present

## 2014-06-25 DIAGNOSIS — Z7901 Long term (current) use of anticoagulants: Secondary | ICD-10-CM | POA: Diagnosis not present

## 2014-06-25 DIAGNOSIS — Z952 Presence of prosthetic heart valve: Secondary | ICD-10-CM

## 2014-06-25 DIAGNOSIS — Z5181 Encounter for therapeutic drug level monitoring: Secondary | ICD-10-CM | POA: Diagnosis not present

## 2014-06-25 DIAGNOSIS — R0602 Shortness of breath: Secondary | ICD-10-CM

## 2014-06-25 DIAGNOSIS — I5021 Acute systolic (congestive) heart failure: Secondary | ICD-10-CM

## 2014-06-25 LAB — POCT INR: INR: 2.2

## 2014-06-25 NOTE — Telephone Encounter (Signed)
Order placed for Echo at [facility].

## 2014-06-25 NOTE — Telephone Encounter (Signed)
Called in to update her medication list . Read off each bottle she has.  See med list

## 2014-06-25 NOTE — Telephone Encounter (Signed)
Message copied by Tressa Busman on Thu Jun 25, 2014  1:10 PM ------      Message from: Shirl Harris I      Created: Wed Jun 24, 2014  2:58 PM      Regarding: order for echo at [facility]            Can you put an order in for an echo at [facility]?            Thanks      [place] ------

## 2014-06-30 LAB — MDC_IDC_ENUM_SESS_TYPE_INCLINIC
Battery Voltage: 2.64 V
Brady Statistic AP VP Percent: 76.6 %
Brady Statistic AP VS Percent: 0.1 % — CL
Brady Statistic AS VP Percent: 23.4 %
Brady Statistic AS VS Percent: 0.1 % — CL
Lead Channel Impedance Value: 380 Ohm
Lead Channel Impedance Value: 418 Ohm
Lead Channel Pacing Threshold Amplitude: 0.75 V
Lead Channel Pacing Threshold Amplitude: 1.25 V
Lead Channel Pacing Threshold Pulse Width: 0.4 ms
Lead Channel Pacing Threshold Pulse Width: 0.4 ms
Lead Channel Sensing Intrinsic Amplitude: 1.8 mV
Lead Channel Sensing Intrinsic Amplitude: 8.6 mV
Lead Channel Setting Pacing Amplitude: 2 V
Lead Channel Setting Pacing Amplitude: 2.75 V
Lead Channel Setting Pacing Pulse Width: 0.4 ms
Lead Channel Setting Sensing Sensitivity: 0.3 mV
Zone Setting Detection Interval: 320 ms
Zone Setting Detection Interval: 350 ms
Zone Setting Detection Interval: 360 ms
Zone Setting Detection Interval: 410 ms

## 2014-07-01 ENCOUNTER — Ambulatory Visit (HOSPITAL_COMMUNITY)
Admission: RE | Admit: 2014-07-01 | Discharge: 2014-07-01 | Disposition: A | Discharge status: Home / Self Care | Payer: Medicare Other | Source: Ambulatory Visit | Attending: Cardiovascular Disease | Admitting: Cardiovascular Disease

## 2014-07-01 DIAGNOSIS — I080 Rheumatic disorders of both mitral and aortic valves: Secondary | ICD-10-CM | POA: Insufficient documentation

## 2014-07-01 DIAGNOSIS — J449 Chronic obstructive pulmonary disease, unspecified: Secondary | ICD-10-CM | POA: Insufficient documentation

## 2014-07-01 DIAGNOSIS — E119 Type 2 diabetes mellitus without complications: Secondary | ICD-10-CM | POA: Insufficient documentation

## 2014-07-01 DIAGNOSIS — I509 Heart failure, unspecified: Secondary | ICD-10-CM | POA: Insufficient documentation

## 2014-07-01 DIAGNOSIS — I10 Essential (primary) hypertension: Secondary | ICD-10-CM | POA: Insufficient documentation

## 2014-07-01 DIAGNOSIS — Z950 Presence of cardiac pacemaker: Secondary | ICD-10-CM | POA: Diagnosis not present

## 2014-07-01 DIAGNOSIS — Z87891 Personal history of nicotine dependence: Secondary | ICD-10-CM | POA: Diagnosis not present

## 2014-07-01 DIAGNOSIS — Z6837 Body mass index (BMI) 37.0-37.9, adult: Secondary | ICD-10-CM | POA: Insufficient documentation

## 2014-07-01 DIAGNOSIS — I5021 Acute systolic (congestive) heart failure: Secondary | ICD-10-CM

## 2014-07-01 DIAGNOSIS — R0602 Shortness of breath: Secondary | ICD-10-CM | POA: Diagnosis not present

## 2014-07-01 DIAGNOSIS — Z952 Presence of prosthetic heart valve: Secondary | ICD-10-CM | POA: Diagnosis not present

## 2014-07-01 DIAGNOSIS — E669 Obesity, unspecified: Secondary | ICD-10-CM | POA: Diagnosis not present

## 2014-07-01 DIAGNOSIS — I369 Nonrheumatic tricuspid valve disorder, unspecified: Secondary | ICD-10-CM | POA: Diagnosis not present

## 2014-07-01 DIAGNOSIS — I071 Rheumatic tricuspid insufficiency: Secondary | ICD-10-CM | POA: Insufficient documentation

## 2014-07-01 NOTE — Progress Notes (Signed)
  Echocardiogram 2D Echocardiogram has been performed.  [place], [place] 07/01/2014, 4:47 PM

## 2014-07-02 ENCOUNTER — Encounter: Payer: Self-pay | Admitting: Internal Medicine

## 2014-07-03 ENCOUNTER — Telehealth: Payer: Self-pay | Admitting: *Deleted

## 2014-07-03 MED ORDER — METOPROLOL SUCCINATE ER 50 MG PO TB24
150.0000 mg | ORAL_TABLET | Freq: Every day | ORAL | Status: DC
Start: 2014-07-03 — End: 2014-07-20

## 2014-07-03 NOTE — Telephone Encounter (Signed)
Message copied by Tressa Busman on Fri Jul 03, 2014  3:18 PM ------      Message from: Sanda Klein      Created: Thu Jul 02, 2014  2:25 PM       Echo is not much changed from before. The gradients across her mitral valve prosthesis are a little high, and she might do better with a slower heart rate.       Pamala Hurry, I would like like her to increase her Toprol to 150 mg daily (one and a half tablets) and come back to see me in about a month ------

## 2014-07-03 NOTE — Telephone Encounter (Signed)
Patient notified to increase Toprol to 150mg  daily.  Voiced understanding.

## 2014-07-06 ENCOUNTER — Telehealth: Payer: Self-pay | Admitting: *Deleted

## 2014-07-06 NOTE — Telephone Encounter (Signed)
Pt states she has appt with Cardiologist in [place] at 8:30am on 10/26.  Has appt with me that afternoon.  Wants to know if she is late if I can still see her.  Told her I would be glad to accommodate her.

## 2014-07-06 NOTE — Telephone Encounter (Signed)
Patient would like return phone call to ask you questions. / tgs

## 2014-07-09 ENCOUNTER — Encounter (HOSPITAL_COMMUNITY): Payer: Medicare Other

## 2014-07-09 ENCOUNTER — Encounter (HOSPITAL_COMMUNITY): Payer: Self-pay

## 2014-07-09 ENCOUNTER — Encounter (HOSPITAL_COMMUNITY): Payer: Medicare Other | Attending: Internal Medicine

## 2014-07-09 VITALS — BP 116/52 | HR 89 | Temp 98.2°F | Resp 18 | Wt 236.1 lb

## 2014-07-09 DIAGNOSIS — D649 Anemia, unspecified: Secondary | ICD-10-CM

## 2014-07-09 DIAGNOSIS — N183 Chronic kidney disease, stage 3 unspecified: Secondary | ICD-10-CM

## 2014-07-09 DIAGNOSIS — Z952 Presence of prosthetic heart valve: Secondary | ICD-10-CM

## 2014-07-09 DIAGNOSIS — E611 Iron deficiency: Secondary | ICD-10-CM | POA: Diagnosis present

## 2014-07-09 DIAGNOSIS — N181 Chronic kidney disease, stage 1: Secondary | ICD-10-CM | POA: Diagnosis not present

## 2014-07-09 DIAGNOSIS — D638 Anemia in other chronic diseases classified elsewhere: Secondary | ICD-10-CM | POA: Insufficient documentation

## 2014-07-09 DIAGNOSIS — D631 Anemia in chronic kidney disease: Secondary | ICD-10-CM

## 2014-07-09 DIAGNOSIS — D594 Other nonautoimmune hemolytic anemias: Secondary | ICD-10-CM | POA: Diagnosis not present

## 2014-07-09 DIAGNOSIS — E559 Vitamin D deficiency, unspecified: Secondary | ICD-10-CM | POA: Diagnosis not present

## 2014-07-09 DIAGNOSIS — Z79899 Other long term (current) drug therapy: Secondary | ICD-10-CM | POA: Diagnosis not present

## 2014-07-09 DIAGNOSIS — R809 Proteinuria, unspecified: Secondary | ICD-10-CM | POA: Diagnosis not present

## 2014-07-09 LAB — CBC WITH DIFFERENTIAL/PLATELET
Basophils Absolute: 0 10*3/uL (ref 0.0–0.1)
Basophils Relative: 0 % (ref 0–1)
Eosinophils Absolute: 0 10*3/uL (ref 0.0–0.7)
Eosinophils Relative: 0 % (ref 0–5)
HCT: 32.4 % — ABNORMAL LOW (ref 36.0–46.0)
Hemoglobin: 10.4 g/dL — ABNORMAL LOW (ref 12.0–15.0)
Lymphocytes Relative: 18 % (ref 12–46)
Lymphs Abs: 1 10*3/uL (ref 0.7–4.0)
MCH: 30.9 pg (ref 26.0–34.0)
MCHC: 32.1 g/dL (ref 30.0–36.0)
MCV: 96.1 fL (ref 78.0–100.0)
Monocytes Absolute: 0.3 10*3/uL (ref 0.1–1.0)
Monocytes Relative: 5 % (ref 3–12)
Neutro Abs: 4.5 10*3/uL (ref 1.7–7.7)
Neutrophils Relative %: 77 % (ref 43–77)
Platelets: 251 10*3/uL (ref 150–400)
RBC: 3.37 MIL/uL — ABNORMAL LOW (ref 3.87–5.11)
RDW: 14.5 % (ref 11.5–15.5)
WBC: 5.8 10*3/uL (ref 4.0–10.5)

## 2014-07-09 MED ORDER — EPOETIN ALFA 40000 UNIT/ML IJ SOLN: 40000.0000 [IU] | Freq: Once | INTRAMUSCULAR | Status: DC

## 2014-07-09 MED ORDER — EPOETIN ALFA 40000 UNIT/ML IJ SOLN
INTRAMUSCULAR | Status: AC
  Filled 2014-07-09: qty 1

## 2014-07-09 MED ORDER — EPOETIN ALFA 40000 UNIT/ML IJ SOLN
40000.0000 [IU] | Freq: Once | INTRAMUSCULAR | Status: AC
  Administered 2014-07-09: 40000 [IU] via SUBCUTANEOUS

## 2014-07-09 NOTE — Patient Instructions (Signed)
[facility] Discharge Instructions  RECOMMENDATIONS MADE BY THE CONSULTANT AND ANY TEST RESULTS WILL BE SENT TO YOUR REFERRING PHYSICIAN.  EXAM FINDINGS BY THE PHYSICIAN TODAY AND SIGNS OR SYMPTOMS TO REPORT TO CLINIC OR PRIMARY PHYSICIAN: You saw Dr Barnet Glasgow today  MEDICATIONS PRESCRIBED:  Nothing new given.  INSTRUCTIONS GIVEN AND DISCUSSED: Procrit 40,000 units today.  Your hemoglobin was 10.4  SPECIAL INSTRUCTIONS/FOLLOW-UP: Lab work and possible procrit injection every 3 weeks.  Follow up with the doctor in 9 weeks  Thank you for choosing [facility] to provide your oncology and hematology care.  To afford each patient quality time with our providers, please arrive at least 15 minutes before your scheduled appointment time.  With your help, our goal is to use those 15 minutes to complete the necessary work-up to ensure our physicians have the information they need to help with your evaluation and healthcare recommendations.    Effective January 1st, 2014, we ask that you re-schedule your appointment with our physicians should you arrive 10 or more minutes late for your appointment.  We strive to give you quality time with our providers, and arriving late affects you and other patients whose appointments are after yours.    Again, thank you for choosing [facility].  Our hope is that these requests will decrease the amount of time that you wait before being seen by our physicians.       _____________________________________________________________  Should you have questions after your visit to [facility], please contact our office at [phone] between the hours of 8:30 a.m. and 5:00 p.m.  Voicemails left after 4:30 p.m. will not be returned until the following business day.  For prescription refill requests, have your pharmacy contact our office with your prescription refill request.

## 2014-07-09 NOTE — Progress Notes (Signed)
[facility]  OFFICE PROGRESS NOTE  [name], MD [address] [place] [address]  DIAGNOSIS: Iron deficiency - Plan: Ferritin, epoetin alfa (EPOGEN,PROCRIT) injection 40,000 Units, CBC with Differential, Ferritin  No chief complaint on file.   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  INTERVAL HISTORY: Junette Moody 80 y.o. 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 Last ferritin on 06/18/2014 was 1567. She is receiving receiving Procrit with last dose of 40,000 units given on 06/18/2014 when hemoglobin was 10.5. Last Feraheme infusion was 03/27/2013. She does have body is once in a while but does have pain medicine at home to relieve. She denies any palpitations, dark urine, epistaxis, melena, hematochezia, lower extremity swelling or redness, chest pain, skin rash, headache, or seizures.    MEDICAL HISTORY: Past Medical History  Diagnosis Date  . DJD (degenerative joint disease) of lumbar spine   . Chronic kidney disease, stage I   . Esophageal reflux   . Other and unspecified hyperlipidemia   . Other primary cardiomyopathies   . Obesity, unspecified   . Unspecified hypothyroidism   . DM (diabetes mellitus)   . Essential hypertension, benign   . 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 Pain in joint, pelvic region and thigh   . Insomnia, unspecified   . Iron deficiency 10/04/2011  . IDA (iron deficiency anemia)     Parenteral iron/[name]  . CHF (congestive heart failure)   . COPD (chronic obstructive pulmonary disease)   . Heart murmur   . ICD (implantable cardiac defibrillator) in place   . Adenomatous polyp of colon 12/16/2002    Dr. [name]/[facility]  . Hyperlipemia   . Hemolysis 09/24/2012  . 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, not stated as uncontrolled(250.40); HYPERLIPIDEMIA; OBESITY; HYPERTENSION - with Cardiac Abnormalities; Non-ischemic cardiomyopathy with ICD; VARICOSE VEINS LOWER EXTREMITIES W/OTH COMPS; GERD; CHOLELITHIASIS; HIP PAIN, RIGHT; DEGENERATIVE JOINT DISEASE, SPINE; NAUSEA; Allergic rhinitis; Low back pain; Iron deficiency; Papilloma of breast; Warfarin-induced coagulopathy; Cardiorenal syndrome with renal failure; S/P AVR (aortic valve replacement); S/P MVR (mitral valve replacement); AICD (automatic cardioverter/defibrillator) present; CKD (chronic kidney disease), stage III - due to DM, HTN and cardiorenal syndrome; Chronic anticoagulation, on coumadin for Mechanical AVR and MVR; Upper leg pain, rt. from use of BSC?  ; COPD (chronic obstructive pulmonary disease); Chronic systolic heart failure; H/O adenomatous polyp of colon; High risk medication use; Routine general medical examination at a health care facility; Hemolysis; Spinal stenosis of lumbar region; OA (osteoarthritis) of knee; Rotator cuff syndrome of left shoulder; Other malaise and fatigue; Encounter for therapeutic drug monitoring; Osteopenia; Need for prophylactic vaccination and inoculation against influenza; and Anemia on her problem list.    ALLERGIES:  is allergic to aspirin; niacin; and penicillins.  MEDICATIONS: has a current medication list which includes the following prescription(s): acetaminophen, albuterol, albuterol, allopurinol, bidil, choline fenofibrate, ezetimibe-simvastatin, fluticasone, fluticasone-salmeterol, folic acid, furosemide, hydrocodone-acetaminophen, klor-con m20, lanoxin, salonpas, losartan, meclizine, methocarbamol, metolazone, metoprolol succinate, centrum silver adult 50+, ondansetron, polyethylene glycol powder,  synthroid, temazepam, warfarin, and gabapentin, and the following Facility-Administered Medications: epoetin alfa.  SURGICAL HISTORY:  Past Surgical History  Procedure Laterality Date  .  Thyroidectomy    . Pacemaker placed  2004  . Aortic and mitral valve replacement  2001  . Right breast cyst removed      benign   . Right cataract removed      2005  . Defibrillator implanted 2006    . Cardiac valve replacement    . Cardiac catheterization    . Insert / replace / remove pacemaker    . Esophagogastroduodenoscopy  06/12/2007    Rourk- normal esophagus, small hiatal hernia, otherwise normal stomach, D1, D2  . Colonoscopy  06/12/2007    Rourk- Normal rectum/Normal colon  . Colonoscopy  12/16/02    small hemorrhoids  . Colonoscopy  06/20/2012    Procedure: COLONOSCOPY;  Surgeon: Daneil Dolin, MD;  Location: [facility];  Service: Endoscopy;  Laterality: N/A;  10:45    FAMILY HISTORY: family history includes Cancer in her mother; Diabetes in her brother; Heart disease in her brother, father, and sister.  SOCIAL HISTORY:  reports that she quit smoking about 27 years ago. Her smoking use included Cigarettes. She has a 30 pack-year smoking history. She has never used smokeless tobacco. She reports that she does not drink alcohol or use illicit drugs.  REVIEW OF SYSTEMS:  Other than that discussed above is noncontributory.  PHYSICAL EXAMINATION: ECOG PERFORMANCE STATUS: 1 - Symptomatic but completely ambulatory  Blood pressure 116/52, pulse 89, temperature 98.2 F (36.8 C), temperature source Oral, resp. rate 18, weight 236 lb 1.6 oz (107.094 kg), SpO2 93.00%.  GENERAL:alert, no distress and comfortable. Morbidly obese. SKIN: skin color, texture, turgor are normal, no rashes or significant lesions. A few ecchymoses on upper extremities. EYES: PERLA; Conjunctiva are pink and non-injected, sclera clear SINUSES: No redness or tenderness over maxillary or ethmoid sinuses OROPHARYNX:no  exudate, no erythema on lips, buccal mucosa, or tongue. NECK: supple, thyroid normal size, non-tender, without nodularity. No masses CHEST: Normal AP diameter with no breast masses. LYMPH:  no palpable lymphadenopathy in the cervical, axillary or inguinal LUNGS: clear to auscultation and percussion with normal breathing effort HEART: regular  with clicking sounds are replace her bowels without diastolic murmur. ABDOMEN:abdomen soft, non-tender and normal bowel sounds MUSCULOSKELETAL:no cyanosis of digits and no clubbing. Range of motion normal.  NEURO: alert & oriented x 3 with fluent speech, no focal motor/sensory deficits   LABORATORY DATA: Lab on 07/09/2014  Component Date Value Ref Range Status  . WBC 07/09/2014 5.8  4.0 - 10.5 K/uL Final  . RBC 07/09/2014 3.37* 3.87 - 5.11 MIL/uL Final  . Hemoglobin 07/09/2014 10.4* 12.0 - 15.0 g/dL Final  . HCT 07/09/2014 32.4* 36.0 - 46.0 % Final  . MCV 07/09/2014 96.1  78.0 - 100.0 fL Final  . MCH 07/09/2014 30.9  26.0 - 34.0 pg Final  . MCHC 07/09/2014 32.1  30.0 - 36.0 g/dL Final  . RDW 07/09/2014 14.5  11.5 - 15.5 % Final  . Platelets 07/09/2014 251  150 - 400 K/uL Final  . Neutrophils Relative % 07/09/2014 77  43 - 77 % Final  . Neutro Abs 07/09/2014 4.5  1.7 - 7.7 K/uL Final  . Lymphocytes Relative 07/09/2014 18  12 - 46 % Final  . Lymphs Abs 07/09/2014 1.0  0.7 - 4.0 K/uL Final  . Monocytes Relative 07/09/2014 5  3 - 12 % Final  . Monocytes Absolute 07/09/2014 0.3  0.1 - 1.0 K/uL Final  . Eosinophils Relative 07/09/2014 0  0 - 5 % Final  . Eosinophils Absolute 07/09/2014 0.0  0.0 - 0.7 K/uL Final  . Basophils Relative 07/09/2014 0  0 - 1 % Final  . Basophils Absolute 07/09/2014 0.0  0.0 - 0.1 K/uL Final  Anti-coag visit on 06/25/2014  Component Date Value Ref Range Status  . INR 06/25/2014 2.2   Final  Office Visit on 06/24/2014  Component Date Value Ref Range Status  . Pulse Generator Manufacturer 06/30/2014 Medtronic   Final  .  Pulse Gen Model 06/30/2014 [id number] Secura DR   Final  . Pulse Gen Serial Number 06/30/2014 [id number]   Final  . RV Sense Sensitivity 06/30/2014 0.3   Final  . RA Pace Amplitude 06/30/2014 2   Final  . RV Pace PulseWidth 06/30/2014 0.4   Final  . RV Pace Amplitude 06/30/2014 2.75   Final  . Zone Setting Type Category 06/30/2014 VF   Final  . Zone Detect Interval 06/30/2014 320   Final  . Zone Setting Type Category 06/30/2014 VT   Final  . Zone Detect Interval 06/30/2014 Blank   Final  . Zone Setting Type Category 06/30/2014 VENTRICULAR_TACHYCARDIA_1   Final  . Zone Detect Interval 06/30/2014 360   Final  . Zone Setting Type Category 06/30/2014 VENTRICULAR_TACHYCARDIA_2   Final  . Zone Detect Interval 06/30/2014 410   Final  . Zone Setting Type Category 06/30/2014 ATRIAL_FIBRILLATION   Final  . Zone Setting Type Category 06/30/2014 ATAF   Final  . Zone Detect Interval 06/30/2014 350   Final  . RA Impedance 06/30/2014 418   Final  . RA Amplitude 06/30/2014 1.8   Final  . RA Pacing Amplitude 06/30/2014 0.75   Final  . RA Pacing PulseWidth 06/30/2014 0.4   Final  . RV IMPEDANCE 06/30/2014 380   Final  . RV Amplitude 06/30/2014 8.6   Final  . RV Pacing Amplitude 06/30/2014 1.25   Final  . RV Pacing PulseWidth 06/30/2014 0.4   Final  . HighPow Impedance 06/30/2014 33/41   Final  . Battery Voltage 06/30/2014 2.64   Final  . Brady AP VP Percent 06/30/2014 76.6   Final  . Brady AS VP Percent 06/30/2014 23.4   Final  . Brady AP VS Percent 06/30/2014 0.1*  Final  . Brady AS VS Percent 06/30/2014 0.1*  Final  . Eval Rhythm 06/30/2014 CHB(VP)   Final  . Miscellaneous Comment 06/30/2014    Final                   Value:ICD check in clinic (industry checked). Normal device function. Thresholds and sensing consistent with previous device measurements. Impedance trends stable over time. No evidence of any ventricular arrhythmias. No mode switches. Thoracic impedance WNL                          at  present---abn btwn 6/25-8/7. Sensor threshold changed to "Low". Device programmed at appropriate safety margins. Device programmed to optimize intrinsic conduction. Batt voltage 2.64V (ERI 2.63V). Pt enrolled in remote follow-up. Plan to follow up                          with [facility] in 1 months.  Lab on 06/18/2014  Component Date Value Ref Range Status  . WBC 06/18/2014 5.3  4.0 - 10.5 K/uL Final  . RBC 06/18/2014 3.34* 3.87 - 5.11 MIL/uL Final  . Hemoglobin 06/18/2014 10.5* 12.0 - 15.0 g/dL Final  . HCT 06/18/2014 31.9* 36.0 - 46.0 % Final  .  MCV 06/18/2014 95.5  78.0 - 100.0 fL Final  . MCH 06/18/2014 31.4  26.0 - 34.0 pg Final  . MCHC 06/18/2014 32.9  30.0 - 36.0 g/dL Final  . RDW 06/18/2014 14.6  11.5 - 15.5 % Final  . Platelets 06/18/2014 213  150 - 400 K/uL Final  . Neutrophils Relative % 06/18/2014 74  43 - 77 % Final  . Neutro Abs 06/18/2014 3.9  1.7 - 7.7 K/uL Final  . Lymphocytes Relative 06/18/2014 19  12 - 46 % Final  . Lymphs Abs 06/18/2014 1.0  0.7 - 4.0 K/uL Final  . Monocytes Relative 06/18/2014 7  3 - 12 % Final  . Monocytes Absolute 06/18/2014 0.4  0.1 - 1.0 K/uL Final  . Eosinophils Relative 06/18/2014 0  0 - 5 % Final  . Eosinophils Absolute 06/18/2014 0.0  0.0 - 0.7 K/uL Final  . Basophils Relative 06/18/2014 0  0 - 1 % Final  . Basophils Absolute 06/18/2014 0.0  0.0 - 0.1 K/uL Final  . Ferritin 06/18/2014 1567* 10 - 291 ng/mL Final   Performed at [facility]: No new pathology.  Urinalysis    Component Value Date/Time   COLORURINE YELLOW 03/27/2012 1201   APPEARANCEUR CLEAR 03/27/2012 1201   LABSPEC 1.018 03/27/2012 1201   PHURINE 5.5 03/27/2012 1201   GLUCOSEU NEGATIVE 03/27/2012 1201   GLUCOSEU NEG mg/dL 03/22/2010 2313   HGBUR NEGATIVE 03/27/2012 1201   HGBUR negative 05/27/2010 1122   BILIRUBINUR neg 03/11/2013 1443   BILIRUBINUR NEGATIVE 03/27/2012 1201   KETONESUR NEGATIVE 03/27/2012 1201   PROTEINUR neg 03/11/2013 1443   PROTEINUR NEGATIVE  03/27/2012 1201   UROBILINOGEN 0.2 03/11/2013 1443   UROBILINOGEN 0.2 03/27/2012 1201   NITRITE neg 03/11/2013 1443   NITRITE NEGATIVE 03/27/2012 1201   LEUKOCYTESUR Negative 03/11/2013 1443    RADIOGRAPHIC STUDIES: No results found.  ASSESSMENT:  #1. 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 Procrit is maintain hemoglobin between 10.5 and 11.  #2. Status post aortic and mitral valve replacements.  #3. Chronic kidney disease.  #4. Increasing lower extremity edema, improved    PLAN:  #1. Procrit 40,000 units subcutaneous didn't see today and every 3 weeks if hemoglobin is less than 11. #2. Followup in 9 weeks with CBC and ferritin.   All questions were answered. The patient knows to call the clinic with any problems, questions or concerns. We can certainly see the patient much sooner if necessary.   I spent 25 minutes counseling the patient face to face. The total time spent in the appointment was 30 minutes.    Doroteo Bradford, MD 07/09/2014 2:26 PM  DISCLAIMER:  This note was dictated with voice recognition software.  Similar sounding words can inadvertently be transcribed inaccurately and may not be corrected upon review.

## 2014-07-09 NOTE — Progress Notes (Signed)
LABS DRAWN FOR [facility].DR B. ORDERS SENT OUT

## 2014-07-09 NOTE — Progress Notes (Signed)
[name] [name]'s reason for visit today is for an injection and labs as scheduled per MD orders.  Labs were drawn prior to administration of ordered medication.[name] [name] also received procrit 40,000 units per MD orders; see [facility] for administration details.  [name] [name] tolerated all procedures well and without incident; questions were answered and patient was discharged.

## 2014-07-20 ENCOUNTER — Ambulatory Visit (INDEPENDENT_AMBULATORY_CARE_PROVIDER_SITE_OTHER): Payer: Medicare Other | Admitting: Cardiology

## 2014-07-20 ENCOUNTER — Encounter: Payer: Self-pay | Admitting: Cardiology

## 2014-07-20 ENCOUNTER — Ambulatory Visit (INDEPENDENT_AMBULATORY_CARE_PROVIDER_SITE_OTHER): Payer: Medicare Other | Admitting: *Deleted

## 2014-07-20 VITALS — BP 122/62 | HR 85 | Ht 67.0 in | Wt 236.7 lb

## 2014-07-20 DIAGNOSIS — Z952 Presence of prosthetic heart valve: Secondary | ICD-10-CM

## 2014-07-20 DIAGNOSIS — Z954 Presence of other heart-valve replacement: Secondary | ICD-10-CM | POA: Diagnosis not present

## 2014-07-20 DIAGNOSIS — R5383 Other fatigue: Secondary | ICD-10-CM | POA: Insufficient documentation

## 2014-07-20 DIAGNOSIS — N189 Chronic kidney disease, unspecified: Secondary | ICD-10-CM

## 2014-07-20 DIAGNOSIS — I5022 Chronic systolic (congestive) heart failure: Secondary | ICD-10-CM

## 2014-07-20 DIAGNOSIS — Z7901 Long term (current) use of anticoagulants: Secondary | ICD-10-CM

## 2014-07-20 DIAGNOSIS — N184 Chronic kidney disease, stage 4 (severe): Secondary | ICD-10-CM

## 2014-07-20 DIAGNOSIS — D631 Anemia in chronic kidney disease: Secondary | ICD-10-CM

## 2014-07-20 DIAGNOSIS — I428 Other cardiomyopathies: Secondary | ICD-10-CM

## 2014-07-20 DIAGNOSIS — Z95810 Presence of automatic (implantable) cardiac defibrillator: Secondary | ICD-10-CM | POA: Diagnosis not present

## 2014-07-20 DIAGNOSIS — I429 Cardiomyopathy, unspecified: Secondary | ICD-10-CM

## 2014-07-20 DIAGNOSIS — Z5181 Encounter for therapeutic drug level monitoring: Secondary | ICD-10-CM | POA: Diagnosis not present

## 2014-07-20 LAB — POCT INR: INR: 3

## 2014-07-20 NOTE — Patient Instructions (Addendum)
Your physician recommends that you schedule a follow-up appointment in: 5 Months with Dr Sallyanne Kuster  Your physician has recommended you make the following change in your medication: Decrease Metoprolol to 100 mg daily

## 2014-07-20 NOTE — Assessment & Plan Note (Signed)
Followed by Dr Befakadu 

## 2014-07-20 NOTE — Progress Notes (Signed)
07/20/2014 [name] [name]   1932/08/09  SN:[id number]  Primary Physicia [name], MD Primary Cardiologist: Dr Sallyanne Kuster  HPI:  [name] [name] 80 y/o AA female, former long-term patient of [name] [name], now followed in this office by Dr Sallyanne Kuster.. She has a complex history of cardiac problems. She has undergone aortic and mitral valve replacements as 2 separate surgeries in 2001 in 2004 in [place]. Both prostheses are St. Jude's mechanical valves.  She has a severe cardiomyopathy with left ventricular ejection fraction of 20-25%, which has been stable at this level at least 2004. Coronary angiography which was performed in 2001 before her first valve replacement showed normal coronaries. Her most recent echocardiogram was performed on 07/01/2014. LVEF was 25-30%.            In 2004 she also had complete heart block and received a dual chamber pacemaker in. In 2006 she was upgraded to a dual-chamber a defibrillator. An attempt was made to upgrade the device to a biventricular ICD in 2006, but a coronary sinus lead could not be placed because of tortuosity of the brachiocephalic system and subclavian vein stenosis. She also had a sprint fidelis lead under advisory and a new defibrillator lead, Sprint Quattro secure was placed in 2007. She was offered an upgrade to a biventricular device in 2011, but reportedly declined so she had a EOL generator change performed by Dr. Rosita Fire at that time.  Her last device check 06/24/14 showed normal function. She is pacemaker dependent due to complete heart block.  She requires 100% ventricular pacing there is no underlying escape rhythm.              Dr Sallyanne Kuster suggested she increase her Toprol to 150 mg daily after her last echo 07/01/14. The gradients across her mitral valve prosthesis were a little high, and he thought she might do better with a slower heart rate. She did increase the Toprol but is here today complaining of fatigue since then. She  denies any increased dyspnea, just generalized lack of energy.      Current Outpatient Prescriptions  Medication Sig Dispense Refill  . acetaminophen (TYLENOL) 325 MG tablet Take 325-650 mg by mouth every 6 (six) hours as needed for mild pain, moderate pain or headache.      . albuterol (PROVENTIL HFA;VENTOLIN HFA) 108 (90 BASE) MCG/ACT inhaler Inhale 2 puffs into the lungs every 6 (six) hours as needed for wheezing or shortness of breath.  1 Inhaler  3  . albuterol (PROVENTIL) (2.5 MG/3ML) 0.083% nebulizer solution Take 2.5 mg by nebulization 2 (two) times daily as needed for wheezing or shortness of breath.      . allopurinol (ZYLOPRIM) 100 MG tablet Take 1 tablet (100 mg total) by mouth daily.  90 tablet  1  . BIDIL 20-37.5 MG per tablet Take 1 tablet by mouth two  times daily  180 tablet  0  . Choline Fenofibrate (TRILIPIX) 135 MG capsule Take 1 capsule (135 mg total) by mouth daily.  90 capsule  1  . ezetimibe-simvastatin (VYTORIN) 10-20 MG per tablet Take 1 tablet by mouth every Monday, Wednesday, and Friday.  21 tablet  0  . fluticasone (FLONASE) 50 MCG/ACT nasal spray Place 2 sprays into both nostrils daily as needed for allergies.      . Fluticasone-Salmeterol (ADVAIR DISKUS) 100-50 MCG/DOSE AEPB inhale 1 dose by mouth twice a day  60 each  4  . folic acid (FOLVITE) 1 MG tablet take 1  tablet by mouth once daily  100 tablet  6  . furosemide (LASIX) 40 MG tablet Take 40 mg by mouth daily.      Marland Kitchen gabapentin (NEURONTIN) 300 MG capsule Take 1 capsule (300 mg total) by mouth at bedtime.  90 capsule  1  . HYDROcodone-acetaminophen (NORCO/VICODIN) 5-325 MG per tablet Take 0.5 tablets by mouth every 6 (six) hours as needed for pain.       Marland Kitchen KLOR-CON M20 20 MEQ tablet Take 1 tablet by mouth  daily  90 tablet  0  . LANOXIN 62.5 MCG TABS Take 1 tablet by mouth  every day  90 tablet  0  . Liniments (SALONPAS) PADS Apply 1 each topically as needed.      Marland Kitchen losartan (COZAAR) 50 MG tablet Take 50 mg  by mouth daily.      . meclizine (ANTIVERT) 12.5 MG tablet Take 1 tablet (12.5 mg total) by mouth 2 (two) times daily as needed for dizziness.  30 tablet  2  . methocarbamol (ROBAXIN) 500 MG tablet Take 1 tablet (500 mg total) by mouth 3 (three) times daily.  30 tablet  0  . metolazone (ZAROXOLYN) 2.5 MG tablet Take 2.5 mg by mouth once a week. Uses on Wednesday      . metoprolol succinate (TOPROL-XL) 50 MG 24 hr tablet Take 100 mg by mouth daily. Take with or immediately following a meal.      . Multiple Vitamins-Minerals (CENTRUM SILVER ADULT 50+) TABS Take 1 tablet by mouth once a week.      . ondansetron (ZOFRAN) 4 MG tablet Take 4 mg by mouth daily as needed for nausea.      . polyethylene glycol powder (MIRALAX) powder Take 17 g by mouth daily as needed for mild constipation.       Marland Kitchen SYNTHROID 75 MCG tablet Take 1 tablet by mouth  daily  90 tablet  0  . temazepam (RESTORIL) 30 MG capsule Take 30 mg by mouth at bedtime as needed for sleep.      Marland Kitchen warfarin (COUMADIN) 5 MG tablet Take as directed by coumadin clinic  10 tablet  0   No current facility-administered medications for this visit.   Facility-Administered Medications Ordered in Other Visits  Medication Dose Route Frequency Provider Last Rate Last Dose  . epoetin alfa (EPOGEN,PROCRIT) injection 40,000 Units  40,000 Units Subcutaneous Once Farrel Gobble, MD        Allergies  Allergen Reactions  . Aspirin     Chronic coumadin  . Niacin Itching  . Penicillins Other (See Comments)    Bruises     History   Social History  . Marital Status: Widowed    Spouse Name: N/A    Number of Children: 0  . Years of Education: N/A   Occupational History  . retired    Social History Main Topics  . Smoking status: Former Smoker -- 0.75 packs/day for 40 years    Types: Cigarettes    Quit date: 03/08/1987  . Smokeless tobacco: Never Used  . Alcohol Use: No  . Drug Use: No  . Sexual Activity: Not on file   Other Topics Concern    . Not on file   Social History Narrative   From [place] (2004)     Review of Systems: General: negative for chills, fever, night sweats or weight changes.  Cardiovascular: negative for chest pain, dyspnea on exertion, edema, orthopnea, palpitations, paroxysmal nocturnal dyspnea or shortness of breath Dermatological: negative  for rash Respiratory: negative for cough or wheezing Urologic: negative for hematuria Abdominal: negative for nausea, vomiting, diarrhea, bright red blood per rectum, melena, or hematemesis Neurologic: negative for visual changes, syncope, or dizziness All other systems reviewed and are otherwise negative except as noted above.    Blood pressure 122/62, pulse 85, height 5\' 7"  (1.702 m), weight 236 lb 11.2 oz (107.366 kg).  General appearance: alert, cooperative, no distress and moderately obese Lungs: clear to auscultation bilaterally Heart: regular rate and rhythm and positive valve sounds Extremities: no edema  EKG AV paced  ASSESSMENT AND PLAN:   Fatigue due to treatment She feels more fatigued since her Toprol was increased  CKD (chronic kidney disease), stage IV Followed by Dr Hinda Lenis  AICD (automatic cardioverter/defibrillator) present 2011 generator change - Medtronic  (Bi-V AICD could not be placed in the past due to tortuous vein anatomy)  S/P AVR (aortic valve replacement) .  S/P MVR (mitral valve replacement) St Jude #25, March 2004  Non-ischemic cardiomyopathy with ICD EF 25%  Anemia Followed at cancer center in [place].  Chronic systolic heart failure Shew appears compensated today.  Chronic anticoagulation, on coumadin for Mechanical AVR and MVR .   PLAN   I suggested she go back to 100 mg daily of Toprol. Follow up with Dr Sallyanne Kuster in 5 months.  Deidre Carino KPA-C 07/20/2014 8:50 AM

## 2014-07-20 NOTE — Assessment & Plan Note (Signed)
EF 25%

## 2014-07-20 NOTE — Assessment & Plan Note (Signed)
St Jude #25, March 2004

## 2014-07-20 NOTE — Assessment & Plan Note (Signed)
2011 generator change - Medtronic  (Bi-V AICD could not be placed in the past due to tortuous vein anatomy)

## 2014-07-20 NOTE — Assessment & Plan Note (Signed)
Followed at cancer center in [place].

## 2014-07-20 NOTE — Assessment & Plan Note (Signed)
Shew appears compensated today.

## 2014-07-20 NOTE — Assessment & Plan Note (Signed)
She feels more fatigued since her Toprol was increased

## 2014-07-21 DIAGNOSIS — D649 Anemia, unspecified: Secondary | ICD-10-CM | POA: Diagnosis not present

## 2014-07-21 DIAGNOSIS — I129 Hypertensive chronic kidney disease with stage 1 through stage 4 chronic kidney disease, or unspecified chronic kidney disease: Secondary | ICD-10-CM | POA: Diagnosis not present

## 2014-07-21 DIAGNOSIS — N184 Chronic kidney disease, stage 4 (severe): Secondary | ICD-10-CM | POA: Diagnosis not present

## 2014-07-21 DIAGNOSIS — R809 Proteinuria, unspecified: Secondary | ICD-10-CM | POA: Diagnosis not present

## 2014-07-21 NOTE — Addendum Note (Signed)
Addended by: Vennie Homans on: 07/21/2014 08:57 AM   Modules accepted: Orders

## 2014-07-29 ENCOUNTER — Other Ambulatory Visit: Payer: Self-pay

## 2014-07-29 ENCOUNTER — Telehealth: Payer: Self-pay

## 2014-07-29 MED ORDER — ALBUTEROL SULFATE HFA 108 (90 BASE) MCG/ACT IN AERS: 2.0000 | INHALATION_SPRAY | Freq: Four times a day (QID) | RESPIRATORY_TRACT | Status: DC | PRN

## 2014-07-29 NOTE — Telephone Encounter (Signed)
Med refilled with 3 additional refills

## 2014-07-30 ENCOUNTER — Encounter (HOSPITAL_BASED_OUTPATIENT_CLINIC_OR_DEPARTMENT_OTHER): Payer: Medicare Other

## 2014-07-30 ENCOUNTER — Other Ambulatory Visit: Payer: Self-pay | Admitting: Family Medicine

## 2014-07-30 ENCOUNTER — Encounter (HOSPITAL_COMMUNITY): Payer: Medicare Other | Attending: Internal Medicine

## 2014-07-30 DIAGNOSIS — D638 Anemia in other chronic diseases classified elsewhere: Secondary | ICD-10-CM | POA: Diagnosis not present

## 2014-07-30 DIAGNOSIS — D631 Anemia in chronic kidney disease: Secondary | ICD-10-CM

## 2014-07-30 DIAGNOSIS — E611 Iron deficiency: Secondary | ICD-10-CM | POA: Diagnosis not present

## 2014-07-30 DIAGNOSIS — D649 Anemia, unspecified: Secondary | ICD-10-CM

## 2014-07-30 DIAGNOSIS — N183 Chronic kidney disease, stage 3 unspecified: Secondary | ICD-10-CM

## 2014-07-30 LAB — CBC WITH DIFFERENTIAL/PLATELET
Basophils Absolute: 0 10*3/uL (ref 0.0–0.1)
Basophils Relative: 0 % (ref 0–1)
Eosinophils Absolute: 0 10*3/uL (ref 0.0–0.7)
Eosinophils Relative: 0 % (ref 0–5)
HCT: 35.1 % — ABNORMAL LOW (ref 36.0–46.0)
Hemoglobin: 11.3 g/dL — ABNORMAL LOW (ref 12.0–15.0)
Lymphocytes Relative: 18 % (ref 12–46)
Lymphs Abs: 1.1 10*3/uL (ref 0.7–4.0)
MCH: 30.7 pg (ref 26.0–34.0)
MCHC: 32.2 g/dL (ref 30.0–36.0)
MCV: 95.4 fL (ref 78.0–100.0)
Monocytes Absolute: 0.3 10*3/uL (ref 0.1–1.0)
Monocytes Relative: 6 % (ref 3–12)
Neutro Abs: 4.3 10*3/uL (ref 1.7–7.7)
Neutrophils Relative %: 76 % (ref 43–77)
Platelets: 235 10*3/uL (ref 150–400)
RBC: 3.68 MIL/uL — ABNORMAL LOW (ref 3.87–5.11)
RDW: 14.4 % (ref 11.5–15.5)
WBC: 5.7 10*3/uL (ref 4.0–10.5)

## 2014-07-30 NOTE — Progress Notes (Signed)
Procrit not administered today - hgb 11.3.

## 2014-07-30 NOTE — Progress Notes (Signed)
LABS FOR [facility]CD 

## 2014-08-10 ENCOUNTER — Telehealth: Payer: Self-pay | Admitting: Cardiovascular Disease

## 2014-08-13 ENCOUNTER — Other Ambulatory Visit: Payer: Self-pay | Admitting: Family Medicine

## 2014-08-13 DIAGNOSIS — M25561 Pain in right knee: Secondary | ICD-10-CM | POA: Diagnosis not present

## 2014-08-13 DIAGNOSIS — M25512 Pain in left shoulder: Secondary | ICD-10-CM | POA: Diagnosis not present

## 2014-08-16 ENCOUNTER — Other Ambulatory Visit: Payer: Self-pay | Admitting: Family Medicine

## 2014-08-17 ENCOUNTER — Ambulatory Visit (INDEPENDENT_AMBULATORY_CARE_PROVIDER_SITE_OTHER): Payer: Medicare Other | Admitting: *Deleted

## 2014-08-17 DIAGNOSIS — Z7901 Long term (current) use of anticoagulants: Secondary | ICD-10-CM | POA: Diagnosis not present

## 2014-08-17 DIAGNOSIS — Z954 Presence of other heart-valve replacement: Secondary | ICD-10-CM | POA: Diagnosis not present

## 2014-08-17 DIAGNOSIS — Z5181 Encounter for therapeutic drug level monitoring: Secondary | ICD-10-CM | POA: Diagnosis not present

## 2014-08-17 DIAGNOSIS — Z952 Presence of prosthetic heart valve: Secondary | ICD-10-CM

## 2014-08-17 LAB — POCT INR: INR: 3.4

## 2014-08-18 NOTE — Telephone Encounter (Signed)
Closed enocunter °

## 2014-08-19 ENCOUNTER — Telehealth: Payer: Self-pay | Admitting: Family Medicine

## 2014-08-19 ENCOUNTER — Encounter (HOSPITAL_BASED_OUTPATIENT_CLINIC_OR_DEPARTMENT_OTHER): Payer: Medicare Other

## 2014-08-19 ENCOUNTER — Encounter (HOSPITAL_COMMUNITY): Payer: Medicare Other

## 2014-08-19 DIAGNOSIS — N183 Chronic kidney disease, stage 3 unspecified: Secondary | ICD-10-CM

## 2014-08-19 DIAGNOSIS — N189 Chronic kidney disease, unspecified: Secondary | ICD-10-CM

## 2014-08-19 DIAGNOSIS — E611 Iron deficiency: Secondary | ICD-10-CM | POA: Diagnosis not present

## 2014-08-19 DIAGNOSIS — D594 Other nonautoimmune hemolytic anemias: Secondary | ICD-10-CM | POA: Diagnosis not present

## 2014-08-19 DIAGNOSIS — D649 Anemia, unspecified: Secondary | ICD-10-CM

## 2014-08-19 LAB — CBC WITH DIFFERENTIAL/PLATELET
Basophils Absolute: 0 10*3/uL (ref 0.0–0.1)
Basophils Relative: 0 % (ref 0–1)
Eosinophils Absolute: 0 10*3/uL (ref 0.0–0.7)
Eosinophils Relative: 0 % (ref 0–5)
HCT: 36.9 % (ref 36.0–46.0)
Hemoglobin: 11.5 g/dL — ABNORMAL LOW (ref 12.0–15.0)
Lymphocytes Relative: 21 % (ref 12–46)
Lymphs Abs: 1.5 10*3/uL (ref 0.7–4.0)
MCH: 30.2 pg (ref 26.0–34.0)
MCHC: 31.2 g/dL (ref 30.0–36.0)
MCV: 96.9 fL (ref 78.0–100.0)
Monocytes Absolute: 0.6 10*3/uL (ref 0.1–1.0)
Monocytes Relative: 8 % (ref 3–12)
Neutro Abs: 5.1 10*3/uL (ref 1.7–7.7)
Neutrophils Relative %: 71 % (ref 43–77)
Platelets: 216 10*3/uL (ref 150–400)
RBC: 3.81 MIL/uL — ABNORMAL LOW (ref 3.87–5.11)
RDW: 14.3 % (ref 11.5–15.5)
WBC: 7.1 10*3/uL (ref 4.0–10.5)

## 2014-08-19 MED ORDER — EZETIMIBE-SIMVASTATIN 10-20 MG PO TABS: ORAL_TABLET | ORAL | Status: DC

## 2014-08-19 NOTE — Progress Notes (Signed)
Hemoglobin 11.5 today, procrit not given, treatment parameters not met. Return to clinic in 3 weeks as scheduled.

## 2014-08-19 NOTE — Progress Notes (Signed)
LABS FOR [facility]CD 

## 2014-08-19 NOTE — Telephone Encounter (Signed)
Med sent.

## 2014-08-24 ENCOUNTER — Other Ambulatory Visit: Payer: Self-pay

## 2014-08-24 DIAGNOSIS — B351 Tinea unguium: Secondary | ICD-10-CM | POA: Diagnosis not present

## 2014-08-24 DIAGNOSIS — E1342 Other specified diabetes mellitus with diabetic polyneuropathy: Secondary | ICD-10-CM | POA: Diagnosis not present

## 2014-08-24 DIAGNOSIS — M79671 Pain in right foot: Secondary | ICD-10-CM | POA: Diagnosis not present

## 2014-08-24 DIAGNOSIS — L609 Nail disorder, unspecified: Secondary | ICD-10-CM | POA: Diagnosis not present

## 2014-08-24 MED ORDER — ISOSORB DINITRATE-HYDRALAZINE 20-37.5 MG PO TABS: ORAL_TABLET | ORAL | Status: DC

## 2014-08-24 MED ORDER — LOSARTAN POTASSIUM 50 MG PO TABS: 50.0000 mg | ORAL_TABLET | Freq: Every day | ORAL | Status: DC

## 2014-08-24 MED ORDER — FUROSEMIDE 40 MG PO TABS: 40.0000 mg | ORAL_TABLET | Freq: Every day | ORAL | Status: DC

## 2014-08-31 ENCOUNTER — Other Ambulatory Visit: Payer: Self-pay | Admitting: Family Medicine

## 2014-09-01 DIAGNOSIS — M25512 Pain in left shoulder: Secondary | ICD-10-CM | POA: Diagnosis not present

## 2014-09-01 DIAGNOSIS — M25561 Pain in right knee: Secondary | ICD-10-CM | POA: Diagnosis not present

## 2014-09-03 ENCOUNTER — Other Ambulatory Visit: Payer: Self-pay | Admitting: Family Medicine

## 2014-09-04 ENCOUNTER — Other Ambulatory Visit: Payer: Self-pay

## 2014-09-04 MED ORDER — ISOSORB DINITRATE-HYDRALAZINE 20-37.5 MG PO TABS: ORAL_TABLET | ORAL | Status: DC

## 2014-09-08 ENCOUNTER — Telehealth: Payer: Self-pay | Admitting: *Deleted

## 2014-09-08 MED ORDER — ISOSORB DINITRATE-HYDRALAZINE 20-37.5 MG PO TABS: ORAL_TABLET | ORAL | Status: DC

## 2014-09-08 NOTE — Telephone Encounter (Signed)
meds refilled 

## 2014-09-08 NOTE — Telephone Encounter (Signed)
Pt called requesting a med refill on Bideal pt is requesting a 30 or 90 day supply. Pt states she is almost out because she is taking the twice a day. Please advise

## 2014-09-09 ENCOUNTER — Telehealth: Payer: Self-pay | Admitting: Family Medicine

## 2014-09-09 MED ORDER — ISOSORB DINITRATE-HYDRALAZINE 20-37.5 MG PO TABS: ORAL_TABLET | ORAL | Status: DC

## 2014-09-09 NOTE — Telephone Encounter (Signed)
Med sent.

## 2014-09-10 ENCOUNTER — Encounter (HOSPITAL_COMMUNITY): Payer: Medicare Other | Attending: Internal Medicine

## 2014-09-10 ENCOUNTER — Encounter (HOSPITAL_COMMUNITY): Payer: Medicare Other | Attending: Hematology and Oncology

## 2014-09-10 ENCOUNTER — Encounter (HOSPITAL_BASED_OUTPATIENT_CLINIC_OR_DEPARTMENT_OTHER): Payer: Medicare Other

## 2014-09-10 VITALS — BP 130/54 | HR 88 | Temp 98.5°F | Resp 22 | Wt 236.0 lb

## 2014-09-10 DIAGNOSIS — D638 Anemia in other chronic diseases classified elsewhere: Secondary | ICD-10-CM | POA: Insufficient documentation

## 2014-09-10 DIAGNOSIS — E611 Iron deficiency: Secondary | ICD-10-CM | POA: Diagnosis not present

## 2014-09-10 DIAGNOSIS — N184 Chronic kidney disease, stage 4 (severe): Secondary | ICD-10-CM

## 2014-09-10 DIAGNOSIS — D594 Other nonautoimmune hemolytic anemias: Secondary | ICD-10-CM

## 2014-09-10 DIAGNOSIS — N183 Chronic kidney disease, stage 3 unspecified: Secondary | ICD-10-CM

## 2014-09-10 DIAGNOSIS — D631 Anemia in chronic kidney disease: Secondary | ICD-10-CM | POA: Diagnosis not present

## 2014-09-10 LAB — CBC WITH DIFFERENTIAL/PLATELET
Basophils Absolute: 0 10*3/uL (ref 0.0–0.1)
Basophils Relative: 0 % (ref 0–1)
Eosinophils Absolute: 0 10*3/uL (ref 0.0–0.7)
Eosinophils Relative: 0 % (ref 0–5)
HCT: 33.3 % — ABNORMAL LOW (ref 36.0–46.0)
Hemoglobin: 10.7 g/dL — ABNORMAL LOW (ref 12.0–15.0)
Lymphocytes Relative: 16 % (ref 12–46)
Lymphs Abs: 1 10*3/uL (ref 0.7–4.0)
MCH: 30.6 pg (ref 26.0–34.0)
MCHC: 32.1 g/dL (ref 30.0–36.0)
MCV: 95.1 fL (ref 78.0–100.0)
Monocytes Absolute: 0.4 10*3/uL (ref 0.1–1.0)
Monocytes Relative: 7 % (ref 3–12)
Neutro Abs: 4.7 10*3/uL (ref 1.7–7.7)
Neutrophils Relative %: 77 % (ref 43–77)
Platelets: 214 10*3/uL (ref 150–400)
RBC: 3.5 MIL/uL — ABNORMAL LOW (ref 3.87–5.11)
RDW: 14.9 % (ref 11.5–15.5)
WBC: 6.1 10*3/uL (ref 4.0–10.5)

## 2014-09-10 MED ORDER — EPOETIN ALFA 40000 UNIT/ML IJ SOLN: 40000.0000 [IU] | Freq: Once | INTRAMUSCULAR | Status: DC

## 2014-09-10 MED ORDER — EPOETIN ALFA 40000 UNIT/ML IJ SOLN
40000.0000 [IU] | Freq: Once | INTRAMUSCULAR | Status: AC
  Administered 2014-09-10: 40000 [IU] via SUBCUTANEOUS

## 2014-09-10 MED ORDER — EPOETIN ALFA 40000 UNIT/ML IJ SOLN
INTRAMUSCULAR | Status: AC
  Filled 2014-09-10: qty 1

## 2014-09-10 NOTE — Patient Instructions (Signed)
[facility] Discharge Instructions  RECOMMENDATIONS MADE BY THE CONSULTANT AND ANY TEST RESULTS WILL BE SENT TO YOUR REFERRING PHYSICIAN.  Return every 3 weeks for lab work and possible Procrit injection Office visit in 9 weeks  Thank you for choosing [facility] to provide your oncology and hematology care.  To afford each patient quality time with our providers, please arrive at least 15 minutes before your scheduled appointment time.  With your help, our goal is to use those 15 minutes to complete the necessary work-up to ensure our physicians have the information they need to help with your evaluation and healthcare recommendations.    Effective January 1st, 2014, we ask that you re-schedule your appointment with our physicians should you arrive 10 or more minutes late for your appointment.  We strive to give you quality time with our providers, and arriving late affects you and other patients whose appointments are after yours.    Again, thank you for choosing [facility]  Our hope is that these requests will decrease the amount of time that you wait before being seen by our physicians.       _____________________________________________________________  Should you have questions after your visit to [facility], please contact our office at [phone] between the hours of 8:30 a.m. and 4:30 p.m.  Voicemails left after 4:30 p.m. will not be returned until the following business day.  For prescription refill requests, have your pharmacy contact our office with your prescription refill request.    _______________________________________________________________  We hope that we have given you very good care.  You may receive a patient satisfaction survey in the mail, please complete it and return it as soon as possible.  We value your feedback!  _______________________________________________________________  Have you asked  about our STAR program?  STAR stands for Survivorship Training and Rehabilitation, and this is a nationally recognized cancer care program that focuses on survivorship and rehabilitation.  Cancer and cancer treatments may cause problems, such as, pain, making you feel tired and keeping you from doing the things that you need or want to do. Cancer rehabilitation can help. Our goal is to reduce these troubling effects and help you have the best quality of life possible.  You may receive a survey from a nurse that asks questions about your current state of health.  Based on the survey results, all eligible patients will be referred to the [facility] program for an evaluation so we can better serve you!  A frequently asked questions sheet is available upon request.

## 2014-09-10 NOTE — Progress Notes (Unsigned)
[name] [name] presents today for injection per the provider's orders.  Procrit administration without incident; see MAR for injection details.  Patient tolerated procedure well and without incident.  No questions or complaints noted at this time. 

## 2014-09-10 NOTE — Progress Notes (Signed)
LABS FOR FERR,CBCD 

## 2014-09-10 NOTE — Progress Notes (Signed)
[facility]  OFFICE PROGRESS NOTE  [name] Nakayama, MD [address] [place] [address]  DIAGNOSIS: Iron deficiency - Plan: epoetin alfa (EPOGEN,PROCRIT) injection 40,000 Units  Anemia of chronic disease  Microangiopathic hemolytic anemia  CKD (chronic kidney disease), stage III  Chief Complaint  Patient presents with  . MAHA  . Iron deficiency  . Erythropoietin deficiency    CURRENT THERAPY: Intermittent iron infusion for microangiopathic hemolytic anemia secondary to mechanical mitral and aortic valves with erythropoietin when appropriate.  INTERVAL HISTORY: [name] [name] 80 y.o. female returns for follow-up of iron deficiency in association with microangiopathic hemolytic anemia due to mitral and aortic valve replacements. Hemoglobin has ranged above 11 so patient has not received Procrit since 07/09/2014 when she received 40,000 units. Most recent hemoglobin is 11.5 on 08/19/2014. She does get short of breath when changing environments. Appetite is good with no nausea or vomiting. She denies any worsening lower extremity swelling or redness. She denies melena, hematochezia, hematuria, hemoptysis, or epistaxis. No episodes of PND, orthopnea, or palpitations.   MEDICAL HISTORY: Past Medical History  Diagnosis Date  . DJD (degenerative joint disease) of lumbar spine   . Chronic kidney disease, stage I   . Esophageal reflux   . Other and unspecified hyperlipidemia   . Other primary cardiomyopathies   . Obesity, unspecified   . Unspecified hypothyroidism   . DM (diabetes mellitus)   . Essential hypertension, benign   . 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 Pain in joint, pelvic region and thigh   . Insomnia, unspecified   . Iron deficiency 10/04/2011  . IDA (iron deficiency anemia)     Parenteral iron/[name]  . CHF (congestive heart failure)   . COPD (chronic obstructive  pulmonary disease)   . Heart murmur   . ICD (implantable cardiac defibrillator) in place   . Adenomatous polyp of colon 12/16/2002    Dr. [name]/[facility]  . Hyperlipemia   . Hemolysis 09/24/2012  . Cancer   . Valvular disease   . Thyroid cancer     remote thyroidectomy, no recurrence    INTERIM HISTORY: has HYPOTHYROIDISM; Type II or unspecified type diabetes mellitus with renal manifestations, not stated as uncontrolled(250.40); HYPERLIPIDEMIA; OBESITY; HYPERTENSION - with Cardiac Abnormalities; Non-ischemic cardiomyopathy with ICD; VARICOSE VEINS LOWER EXTREMITIES W/OTH COMPS; GERD; CHOLELITHIASIS; HIP PAIN, RIGHT; DEGENERATIVE JOINT DISEASE, SPINE; NAUSEA; Allergic rhinitis; Low back pain; Iron deficiency; Papilloma of breast; Warfarin-induced coagulopathy; Cardiorenal syndrome with renal failure; S/P AVR (aortic valve replacement); S/P MVR (mitral valve replacement); AICD (automatic cardioverter/defibrillator) present; CKD (chronic kidney disease), stage IV; Chronic anticoagulation, on coumadin for Mechanical AVR and MVR; Upper leg pain, rt. from use of BSC?  ; COPD (chronic obstructive pulmonary disease); Chronic systolic heart failure; H/O adenomatous polyp of colon; High risk medication use; Routine general medical examination at a health care facility; Hemolysis; Spinal stenosis of lumbar region; OA (osteoarthritis) of knee; Rotator cuff syndrome of left shoulder; Fatigue due to treatment; Encounter for therapeutic drug monitoring; Osteopenia; Need for prophylactic vaccination and inoculation against influenza; Anemia; and Fatigue on her problem list.    ALLERGIES:  is allergic to aspirin; niacin; and penicillins.  MEDICATIONS: has a current medication list which includes the following prescription(s): acetaminophen, albuterol, albuterol, allopurinol, allopurinol, choline fenofibrate, ezetimibe-simvastatin, fluticasone, fluticasone-salmeterol, folic acid,  furosemide, gabapentin, gabapentin, hydrocodone-acetaminophen, isosorbide-hydralazine, klor-con m20, lanoxin, salonpas, losartan, meclizine, methocarbamol,  metolazone, metoprolol succinate, ondansetron, polyethylene glycol powder, synthroid, temazepam, warfarin, and centrum silver adult 50+, and the following Facility-Administered Medications: epoetin alfa and epoetin alfa.  SURGICAL HISTORY:  Past Surgical History  Procedure Laterality Date  . Thyroidectomy    . Pacemaker placed  2004  . Aortic and mitral valve replacement  2001  . Right breast cyst removed      benign   . Right cataract removed      2005  . Defibrillator implanted 2006    . Cardiac valve replacement    . Cardiac catheterization    . Insert / replace / remove pacemaker    . Esophagogastroduodenoscopy  06/12/2007    Rourk- normal esophagus, small hiatal hernia, otherwise normal stomach, D1, D2  . Colonoscopy  06/12/2007    Rourk- Normal rectum/Normal colon  . Colonoscopy  12/16/02    small hemorrhoids  . Colonoscopy  06/20/2012    Procedure: COLONOSCOPY;  Surgeon: Daneil Dolin, MD;  Location: [facility];  Service: Endoscopy;  Laterality: N/A;  10:45  . Doppler echocardiography  2012  . Doppler echocardiography  05,06,07,08,09,2011    FAMILY HISTORY: family history includes Cancer in her mother; Diabetes in her brother; Heart disease in her brother, father, and sister.  SOCIAL HISTORY:  reports that she quit smoking about 27 years ago. Her smoking use included Cigarettes. She has a 30 pack-year smoking history. She has never used smokeless tobacco. She reports that she does not drink alcohol or use illicit drugs.  REVIEW OF SYSTEMS:  Other than that discussed above is noncontributory.  PHYSICAL EXAMINATION: ECOG PERFORMANCE STATUS: 1 - Symptomatic but completely ambulatory  Blood pressure 130/54, pulse 88, temperature 98.5 F (36.9 C), temperature source Oral, resp. rate 22, weight 236 lb (107.049 kg), SpO2 97  %.  GENERAL:alert, no distress and comfortable SKIN: skin color, texture, turgor are normal, no rashes or significant lesions EYES: PERLA; Conjunctiva are pink and non-injected, sclera clear SINUSES: No redness or tenderness over maxillary or ethmoid sinuses OROPHARYNX:no exudate, no erythema on lips, buccal mucosa, or tongue. NECK: supple, thyroid normal size, non-tender, without nodularity. No masses CHEST: Increased AP diameter with no breast masses. LYMPH:  no palpable lymphadenopathy in the cervical, axillary or inguinal LUNGS: clear to auscultation and percussion with normal breathing effort. Basilar crackles and wheezing. HEART: regular rate & rhythm and no murmurs. Clicking sounds of prosthetic valves are heard with no diastolic murmurs. ABDOMEN:abdomen soft, non-tender and normal bowel sounds MUSCULOSKELETAL:no cyanosis of digits and no clubbing. Range of motion normal.  NEURO: alert & oriented x 3 with fluent speech, no focal motor/sensory deficits   LABORATORY DATA: Appointment on 09/10/2014  Component Date Value Ref Range Status  . WBC 09/10/2014 6.1  4.0 - 10.5 K/uL Final  . RBC 09/10/2014 3.50* 3.87 - 5.11 MIL/uL Final  . Hemoglobin 09/10/2014 10.7* 12.0 - 15.0 g/dL Final  . HCT 09/10/2014 33.3* 36.0 - 46.0 % Final  . MCV 09/10/2014 95.1  78.0 - 100.0 fL Final  . MCH 09/10/2014 30.6  26.0 - 34.0 pg Final  . MCHC 09/10/2014 32.1  30.0 - 36.0 g/dL Final  . RDW 09/10/2014 14.9  11.5 - 15.5 % Final  . Platelets 09/10/2014 214  150 - 400 K/uL Final  . Neutrophils Relative % 09/10/2014 77  43 - 77 % Final  . Neutro Abs 09/10/2014 4.7  1.7 - 7.7 K/uL Final  . Lymphocytes Relative 09/10/2014 16  12 - 46 % Final  . Lymphs  Abs 09/10/2014 1.0  0.7 - 4.0 K/uL Final  . Monocytes Relative 09/10/2014 7  3 - 12 % Final  . Monocytes Absolute 09/10/2014 0.4  0.1 - 1.0 K/uL Final  . Eosinophils Relative 09/10/2014 0  0 - 5 % Final  . Eosinophils Absolute 09/10/2014 0.0  0.0 - 0.7 K/uL  Final  . Basophils Relative 09/10/2014 0  0 - 1 % Final  . Basophils Absolute 09/10/2014 0.0  0.0 - 0.1 K/uL Final  Lab on 08/19/2014  Component Date Value Ref Range Status  . WBC 08/19/2014 7.1  4.0 - 10.5 K/uL Final  . RBC 08/19/2014 3.81* 3.87 - 5.11 MIL/uL Final  . Hemoglobin 08/19/2014 11.5* 12.0 - 15.0 g/dL Final  . HCT 08/19/2014 36.9  36.0 - 46.0 % Final  . MCV 08/19/2014 96.9  78.0 - 100.0 fL Final  . MCH 08/19/2014 30.2  26.0 - 34.0 pg Final  . MCHC 08/19/2014 31.2  30.0 - 36.0 g/dL Final  . RDW 08/19/2014 14.3  11.5 - 15.5 % Final  . Platelets 08/19/2014 216  150 - 400 K/uL Final  . Neutrophils Relative % 08/19/2014 71  43 - 77 % Final  . Neutro Abs 08/19/2014 5.1  1.7 - 7.7 K/uL Final  . Lymphocytes Relative 08/19/2014 21  12 - 46 % Final  . Lymphs Abs 08/19/2014 1.5  0.7 - 4.0 K/uL Final  . Monocytes Relative 08/19/2014 8  3 - 12 % Final  . Monocytes Absolute 08/19/2014 0.6  0.1 - 1.0 K/uL Final  . Eosinophils Relative 08/19/2014 0  0 - 5 % Final  . Eosinophils Absolute 08/19/2014 0.0  0.0 - 0.7 K/uL Final  . Basophils Relative 08/19/2014 0  0 - 1 % Final  . Basophils Absolute 08/19/2014 0.0  0.0 - 0.1 K/uL Final  Anti-coag visit on 08/17/2014  Component Date Value Ref Range Status  . INR 08/17/2014 3.4   Final    PATHOLOGY: No new pathology.  Urinalysis    Component Value Date/Time   COLORURINE YELLOW 03/27/2012 1201   APPEARANCEUR CLEAR 03/27/2012 1201   LABSPEC 1.018 03/27/2012 1201   PHURINE 5.5 03/27/2012 1201   GLUCOSEU NEGATIVE 03/27/2012 1201   GLUCOSEU NEG mg/dL 03/22/2010 2313   HGBUR NEGATIVE 03/27/2012 1201   HGBUR negative 05/27/2010 1122   BILIRUBINUR neg 03/11/2013 1443   BILIRUBINUR NEGATIVE 03/27/2012 1201   KETONESUR NEGATIVE 03/27/2012 1201   PROTEINUR neg 03/11/2013 1443   PROTEINUR NEGATIVE 03/27/2012 1201   UROBILINOGEN 0.2 03/11/2013 1443   UROBILINOGEN 0.2 03/27/2012 1201   NITRITE neg 03/11/2013 1443   NITRITE NEGATIVE 03/27/2012  1201   LEUKOCYTESUR Negative 03/11/2013 1443    RADIOGRAPHIC STUDIES: No results found.  ASSESSMENT:  #1. 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 Procrit is maintain hemoglobin between 10.5 and 11 with last injection on 07/09/2014 #2. Status post aortic and mitral valve replacements.  #3. Chronic kidney disease. #4. COPD with hyperactive airways disease.   PLAN:  #1. Use ProAir HFA prior to changing environments to lessen the probability of wheezing. #2. Continue Advair twice a day. #3. Procrit 40,000 units today and every 3 weeks if hemoglobin is less than 11. #4. Follow-up in 9 weeks with CBC, ferritin.   All questions were answered. The patient knows to call the clinic with any problems, questions or concerns. We can certainly see the patient much  sooner if necessary.   I spent 25 minutes counseling the patient face to face. The total time spent in the appointment was 30 minutes.    Doroteo Bradford, MD 09/10/2014 11:49 AM  DISCLAIMER:  This note was dictated with voice recognition software.  Similar sounding words can inadvertently be transcribed inaccurately and may not be corrected upon review.

## 2014-09-11 LAB — FERRITIN: Ferritin: 1911 ng/mL — ABNORMAL HIGH (ref 10–291)

## 2014-09-14 ENCOUNTER — Ambulatory Visit (INDEPENDENT_AMBULATORY_CARE_PROVIDER_SITE_OTHER): Payer: Medicare Other | Admitting: *Deleted

## 2014-09-14 DIAGNOSIS — Z5181 Encounter for therapeutic drug level monitoring: Secondary | ICD-10-CM | POA: Diagnosis not present

## 2014-09-14 DIAGNOSIS — Z7901 Long term (current) use of anticoagulants: Secondary | ICD-10-CM | POA: Diagnosis not present

## 2014-09-14 DIAGNOSIS — E039 Hypothyroidism, unspecified: Secondary | ICD-10-CM | POA: Diagnosis not present

## 2014-09-14 DIAGNOSIS — Z954 Presence of other heart-valve replacement: Secondary | ICD-10-CM | POA: Diagnosis not present

## 2014-09-14 DIAGNOSIS — Z952 Presence of prosthetic heart valve: Secondary | ICD-10-CM

## 2014-09-14 DIAGNOSIS — E1129 Type 2 diabetes mellitus with other diabetic kidney complication: Secondary | ICD-10-CM | POA: Diagnosis not present

## 2014-09-14 DIAGNOSIS — E785 Hyperlipidemia, unspecified: Secondary | ICD-10-CM | POA: Diagnosis not present

## 2014-09-14 LAB — POCT INR: INR: 2.9

## 2014-09-15 LAB — TSH: TSH: 1.114 u[IU]/mL (ref 0.350–4.500)

## 2014-09-15 LAB — HEMOGLOBIN A1C
Hgb A1c MFr Bld: 6.3 % — ABNORMAL HIGH (ref <5.7)
Mean Plasma Glucose: 134 mg/dL — ABNORMAL HIGH (ref <117)

## 2014-09-15 LAB — COMPLETE METABOLIC PANEL WITH GFR
ALT: 10 U/L (ref 0–35)
AST: 23 U/L (ref 0–37)
Albumin: 4.2 g/dL (ref 3.5–5.2)
Alkaline Phosphatase: 25 U/L — ABNORMAL LOW (ref 39–117)
BUN: 52 mg/dL — ABNORMAL HIGH (ref 6–23)
CO2: 28 mEq/L (ref 19–32)
Calcium: 9.4 mg/dL (ref 8.4–10.5)
Chloride: 102 mEq/L (ref 96–112)
Creat: 2.43 mg/dL — ABNORMAL HIGH (ref 0.50–1.10)
GFR, Est African American: 21 mL/min — ABNORMAL LOW
GFR, Est Non African American: 18 mL/min — ABNORMAL LOW
Glucose, Bld: 93 mg/dL (ref 70–99)
Potassium: 4.8 mEq/L (ref 3.5–5.3)
Sodium: 140 mEq/L (ref 135–145)
Total Bilirubin: 0.4 mg/dL (ref 0.2–1.2)
Total Protein: 7.3 g/dL (ref 6.0–8.3)

## 2014-09-15 LAB — LIPID PANEL
Cholesterol: 130 mg/dL (ref 0–200)
HDL: 35 mg/dL — ABNORMAL LOW (ref >39)
LDL Cholesterol: 69 mg/dL (ref 0–99)
Total CHOL/HDL Ratio: 3.7 Ratio
Triglycerides: 132 mg/dL (ref <150)
VLDL: 26 mg/dL (ref 0–40)

## 2014-09-29 ENCOUNTER — Other Ambulatory Visit (HOSPITAL_COMMUNITY): Payer: Self-pay

## 2014-09-29 DIAGNOSIS — N184 Chronic kidney disease, stage 4 (severe): Secondary | ICD-10-CM

## 2014-09-29 DIAGNOSIS — E611 Iron deficiency: Secondary | ICD-10-CM

## 2014-09-30 ENCOUNTER — Other Ambulatory Visit: Payer: Self-pay

## 2014-09-30 MED ORDER — ISOSORB DINITRATE-HYDRALAZINE 20-37.5 MG PO TABS: ORAL_TABLET | ORAL | Status: DC

## 2014-10-01 ENCOUNTER — Encounter (HOSPITAL_COMMUNITY): Payer: Medicare Other | Attending: Internal Medicine

## 2014-10-01 ENCOUNTER — Encounter (HOSPITAL_BASED_OUTPATIENT_CLINIC_OR_DEPARTMENT_OTHER): Payer: Medicare Other

## 2014-10-01 ENCOUNTER — Other Ambulatory Visit (HOSPITAL_COMMUNITY): Payer: Self-pay | Admitting: Oncology

## 2014-10-01 ENCOUNTER — Encounter (HOSPITAL_COMMUNITY): Payer: Self-pay

## 2014-10-01 ENCOUNTER — Telehealth: Payer: Self-pay | Admitting: Cardiovascular Disease

## 2014-10-01 DIAGNOSIS — D594 Other nonautoimmune hemolytic anemias: Secondary | ICD-10-CM

## 2014-10-01 DIAGNOSIS — D631 Anemia in chronic kidney disease: Secondary | ICD-10-CM | POA: Diagnosis not present

## 2014-10-01 DIAGNOSIS — N183 Chronic kidney disease, stage 3 (moderate): Secondary | ICD-10-CM | POA: Diagnosis not present

## 2014-10-01 DIAGNOSIS — E611 Iron deficiency: Secondary | ICD-10-CM

## 2014-10-01 DIAGNOSIS — D638 Anemia in other chronic diseases classified elsewhere: Secondary | ICD-10-CM | POA: Diagnosis not present

## 2014-10-01 DIAGNOSIS — N184 Chronic kidney disease, stage 4 (severe): Secondary | ICD-10-CM

## 2014-10-01 LAB — CBC WITH DIFFERENTIAL/PLATELET
Basophils Absolute: 0 10*3/uL (ref 0.0–0.1)
Basophils Relative: 0 % (ref 0–1)
Eosinophils Absolute: 0 10*3/uL (ref 0.0–0.7)
Eosinophils Relative: 0 % (ref 0–5)
HCT: 32.2 % — ABNORMAL LOW (ref 36.0–46.0)
Hemoglobin: 10.5 g/dL — ABNORMAL LOW (ref 12.0–15.0)
Lymphocytes Relative: 17 % (ref 12–46)
Lymphs Abs: 1 10*3/uL (ref 0.7–4.0)
MCH: 31.1 pg (ref 26.0–34.0)
MCHC: 32.6 g/dL (ref 30.0–36.0)
MCV: 95.3 fL (ref 78.0–100.0)
Monocytes Absolute: 0.4 10*3/uL (ref 0.1–1.0)
Monocytes Relative: 7 % (ref 3–12)
Neutro Abs: 4.4 10*3/uL (ref 1.7–7.7)
Neutrophils Relative %: 76 % (ref 43–77)
Platelets: 254 10*3/uL (ref 150–400)
RBC: 3.38 MIL/uL — ABNORMAL LOW (ref 3.87–5.11)
RDW: 15.3 % (ref 11.5–15.5)
WBC: 5.8 10*3/uL (ref 4.0–10.5)

## 2014-10-01 MED ORDER — EPOETIN ALFA 40000 UNIT/ML IJ SOLN
INTRAMUSCULAR | Status: AC
  Filled 2014-10-01: qty 1

## 2014-10-01 MED ORDER — EPOETIN ALFA 40000 UNIT/ML IJ SOLN
40000.0000 [IU] | Freq: Once | INTRAMUSCULAR | Status: AC
  Administered 2014-10-01: 40000 [IU] via SUBCUTANEOUS

## 2014-10-01 MED ORDER — ISOSORBIDE DINITRATE ER 40 MG PO TBCR: EXTENDED_RELEASE_TABLET | ORAL | Status: DC

## 2014-10-01 MED ORDER — HYDRALAZINE HCL 25 MG PO TABS: 25.0000 mg | ORAL_TABLET | Freq: Three times a day (TID) | ORAL | Status: DC

## 2014-10-01 NOTE — Patient Instructions (Signed)
[place] [facility] Discharge Instructions  RECOMMENDATIONS MADE BY THE CONSULTANT AND ANY TEST RESULTS WILL BE SENT TO YOUR REFERRING PHYSICIAN.  You had your procrit injection today.  Follow up as scheduled.  PLease call the clinic if you have any questions or concerns  Thank you for choosing [place] [facility] to provide your oncology and hematology care.  To afford each patient quality time with our provider, please arrive at least 15 minutes before your scheduled appointment time.    You need to re-schedule your appointment should you arrive 10 or more minutes late.  We strive to give you quality time with our providers, and arriving late affects you and other patients whose appointments are after yours.  Also, if you no show three or more times for appointments you may be dismissed from the clinic at the providers discretion.     Again, thank you for choosing [facility].  Our hope is that these requests will decrease the amount of time that you wait before being seen by our physicians.       _____________________________________________________________  Should you have questions after your visit to [facility], please contact our office at [phone] between the hours of 8:30 a.m. and 4:30 p.m.  Voicemails left after 4:30 p.m. will not be returned until the following business day.  For prescription refill requests, have your pharmacy contact our office.

## 2014-10-01 NOTE — Telephone Encounter (Signed)
Pt need another medicine in the place of Bidil. She can not find it no where. Please call in a new prescription for something else to [facility].Please call this in today.

## 2014-10-01 NOTE — Progress Notes (Signed)
LABS FOR [facility]CD 

## 2014-10-01 NOTE — Progress Notes (Signed)
[name] [name]'s reason for visit today is for an injection and labs as scheduled per MD orders.  Labs were drawn prior to administration of ordered medication.     [name] [name] also received procrit 40,000 units per MD orders; see [facility] for administration details.  [name] [name] tolerated all procedures well and without incident; questions were answered and patient was discharged.

## 2014-10-01 NOTE — Telephone Encounter (Signed)
Medication changed under Dr. Evette Georges recommendations. I called pt and submitted rx to pharmacy of preference, pt elicited understanding regarding changes.

## 2014-10-06 ENCOUNTER — Ambulatory Visit (INDEPENDENT_AMBULATORY_CARE_PROVIDER_SITE_OTHER): Payer: Medicare Other | Admitting: Cardiovascular Disease

## 2014-10-06 ENCOUNTER — Encounter: Payer: Self-pay | Admitting: Cardiovascular Disease

## 2014-10-06 VITALS — BP 110/60 | HR 88 | Resp 16 | Ht 67.0 in | Wt 232.5 lb

## 2014-10-06 DIAGNOSIS — I429 Cardiomyopathy, unspecified: Secondary | ICD-10-CM

## 2014-10-06 DIAGNOSIS — Z954 Presence of other heart-valve replacement: Secondary | ICD-10-CM

## 2014-10-06 DIAGNOSIS — Z7901 Long term (current) use of anticoagulants: Secondary | ICD-10-CM

## 2014-10-06 DIAGNOSIS — Z952 Presence of prosthetic heart valve: Secondary | ICD-10-CM

## 2014-10-06 DIAGNOSIS — I5022 Chronic systolic (congestive) heart failure: Secondary | ICD-10-CM

## 2014-10-06 DIAGNOSIS — I10 Essential (primary) hypertension: Secondary | ICD-10-CM | POA: Diagnosis not present

## 2014-10-06 DIAGNOSIS — Z95810 Presence of automatic (implantable) cardiac defibrillator: Secondary | ICD-10-CM | POA: Diagnosis not present

## 2014-10-06 DIAGNOSIS — I442 Atrioventricular block, complete: Secondary | ICD-10-CM

## 2014-10-06 DIAGNOSIS — I428 Other cardiomyopathies: Secondary | ICD-10-CM

## 2014-10-06 MED ORDER — HYDRALAZINE HCL 25 MG PO TABS
25.0000 mg | ORAL_TABLET | Freq: Two times a day (BID) | ORAL | Status: DC
Start: 2014-10-06 — End: 2015-09-21

## 2014-10-06 NOTE — Patient Instructions (Addendum)
Your physician has requested that you have an echocardiogram. Echocardiography is a painless test that uses sound waves to create images of your heart. It provides your doctor with information about the size and shape of your heart and how well your heart's chambers and valves are working. This procedure takes approximately one hour. There are no restrictions for this procedure.  DECREASE Hydralazine to 25mg  twice a day.  Dr. Sallyanne Kuster recommends that you schedule a follow-up appointment in: One month with Dr. Lovena Le in the device clinic in [place].

## 2014-10-07 ENCOUNTER — Encounter: Payer: Self-pay | Admitting: Cardiovascular Disease

## 2014-10-07 DIAGNOSIS — I442 Atrioventricular block, complete: Secondary | ICD-10-CM | POA: Insufficient documentation

## 2014-10-07 HISTORY — DX: Atrioventricular block, complete: I44.2

## 2014-10-07 LAB — MDC_IDC_ENUM_SESS_TYPE_INCLINIC
Battery Voltage: 2.61 V
Brady Statistic AP VP Percent: 87.9 %
Brady Statistic AP VS Percent: 0.1 % — CL
Brady Statistic AS VP Percent: 12.1 %
Brady Statistic AS VS Percent: 0.1 % — CL
Lead Channel Impedance Value: 380 Ohm
Lead Channel Impedance Value: 437 Ohm
Lead Channel Pacing Threshold Amplitude: 0.5 V
Lead Channel Pacing Threshold Amplitude: 1.25 V
Lead Channel Pacing Threshold Pulse Width: 0.4 ms
Lead Channel Pacing Threshold Pulse Width: 1 ms
Lead Channel Sensing Intrinsic Amplitude: 1.8 mV
Lead Channel Setting Pacing Amplitude: 2 V
Lead Channel Setting Pacing Amplitude: 2.75 V
Lead Channel Setting Pacing Pulse Width: 0.4 ms
Lead Channel Setting Sensing Sensitivity: 0.3 mV
Zone Setting Detection Interval: 320 ms
Zone Setting Detection Interval: 350 ms
Zone Setting Detection Interval: 360 ms
Zone Setting Detection Interval: 410 ms

## 2014-10-07 NOTE — Progress Notes (Signed)
Patient ID: [name] [name], female   DOB: Oct 23, 1931, 81 y.o.   MRN: [id number]      Reason for office visit Nonischemic cardiomyopathy, congestive heart failure, CRT-D follow-up, status post mechanical prosthesis aortic and mitral valve replacement  [name] [name] is here for follow-up on her ICD and her generator is approaching elective replacement indicator (battery voltage is actually 2.61 V, lower than the nominal 2.63 V, but she has not yet "tripped" the replacement indicator). She will probably require generator change out within the next 2-3 months. She is pacemaker dependent secondary to complete heart block and already has 3 leads implanted in her right ventricle. Her chart states that she had a failed attempt at upgrade to biventricular pacemaker system due to tortuosity of the brachiocephalic system and subclavian vein stenosis in 2006. Unfortunately, since that time she has also developed increasing pacing thresholds at the right ventricular lead. Today these thresholds are 1.75 V at 0.4 ms pulse width (1.25 V at 1.0 ms pulse width, 1.5 V at 0.8 ms pulse width).  She seems to be doing fairly well from a heart failure point of view. NYHA functional class II. She started walking around October 1 and there is a remarkable increase in her activity level as recorded by her ICD accelerometer here at she is now moving roughly 1.3 hours a day (compared to 0.3 hours a day). She has 100% ventricular pacing and also has 88% atrial pacing. Heart rate histogram distribution is fair. There have been no recent episodes of hypervolemia according to her Optivol index.  She felt a little dizzy when taking a new medication yesterday. It sounds like she is talking about the generic hydralazine which has replaced her previous prescription for BiDil. The dose of hydralazine is actual little lower than the equivalent in BiDil. She was taking the BiDil only twice daily whereas the hydralazine was prescribed 3 times  daily.  Her warfarin anticoagulation is followed in the [facility] and has been therapeutic.  Has a complex history of cardiac problems. She has undergone aortic and mitral valve replacements as 2 separate surgeries in 2001 in 2004 in [place]. Both prostheses are St. Jude's mechanical valves. Have not been able to secure any records regarding the size of these valves  Severe cardiomyopathy with left ventricular ejection fraction of 20-25%, which has been stable at this level at least 2004. Coronary angiography which was performed in 2001 before her first valve replacement showed normal coronaries (at that time LVEF was 60%).  Her most recent echocardiogram was performed in June of 2014. LVEF was 25-30%. Aortic prosthesis gradients were peak 25 and mean 15 mm Hg. Mitral valve prosthesis gradients were peak 15 and mean 8 mm Hg (heart rate not documented) in the mitral pressure half-time was 100 ms  In 2004 she also had complete heart block and received a dual chamber pacemaker in 2004. In 2006 she was upgraded to a dual-chamber a defibrillator. An attempt was made to upgrade the device to a biventricular ICD in 2006, but a coronary sinus lead could not be placed because of tortuosity of the brachiocephalic system and subclavian vein stenosis. She also had a sprint fidelis lead under advisory and a new defibrillator lead, Sprint Quattro secure was placed in 2007. On her chest x-ray she still has 2 defibrillator leads as well as a right ventricular pacemaker leads (3 ventricular leads are present) as well as an atrial lead. She was offered to upgrade to a biventricular  device in 2011, but reportedly declined so she had a EOL generator change performed by Dr. Rosita Fire at that time.  Her last device check in July 2015 and again today both showed normal function. She is pacemaker dependent due to complete heart block. Her current device is a Medtronic secura implanted in March 2011. Her  atrial lead is a Medtronic [id number] implanted in May 2004. Her right ventricular lead is a Medtronic [id number] implanted in December 2007. She requires 100% ventricular pacing there is no underlying escape rhythm. She also has roughly 77% atrial pacing. There has been no evidence of ventricular or atrial tachyarrhythmia. To my knowledge has never received therapy from any of her ICDs. Heart rate histograms show very blunted the distribution she is very inactive spending less than 30 minutes a day moving.  Optivol thoracic impedance monitoring is currently in the normal range. She appears to be hypervolemic in July/August of this year. Was seen in the emergency room around that time.  Lower extremity arterial Dopplers were normal in 2013. Venous Doppler of the right lower extremity did not show DVT in 2013.  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 She has diabetes mellitus and hyperlipidemia and hyperuricemia. Allergies  Allergen Reactions  . Aspirin     Chronic coumadin  . Niacin Itching  . Penicillins Other (See Comments)    Bruises     Current Outpatient Prescriptions  Medication Sig Dispense Refill  . acetaminophen (TYLENOL) 325 MG tablet Take 325-650 mg by mouth every 6 (six) hours as needed for mild pain, moderate pain or headache.    . albuterol (PROVENTIL HFA;VENTOLIN HFA) 108 (90 BASE) MCG/ACT inhaler Inhale 2 puffs into the lungs every 6 (six) hours as needed for wheezing or shortness of breath. 1 Inhaler 3  . albuterol (PROVENTIL) (2.5 MG/3ML) 0.083% nebulizer solution INHALE 1 VIAL VIA NEBULIZER THREE TIMES DAILY. 300 mL 3  . allopurinol (ZYLOPRIM) 100 MG tablet take 1 tablet by mouth once daily 90 tablet 1  . Choline Fenofibrate (TRILIPIX) 135 MG capsule Take 1 capsule (135 mg total) by mouth daily. 90 capsule 1  . ezetimibe-simvastatin (VYTORIN) 10-20 MG per tablet Take 1 tablet by mouth 3  times weekly 7  tablet 0  . fluticasone (FLONASE) 50 MCG/ACT nasal spray Place 2 sprays into both nostrils daily as needed for allergies.    . Fluticasone-Salmeterol (ADVAIR DISKUS) 100-50 MCG/DOSE AEPB inhale 1 dose by mouth twice a day 60 each 4  . folic acid (FOLVITE) 1 MG tablet take 1 tablet by mouth once daily 100 tablet 6  . furosemide (LASIX) 40 MG tablet Take 1 tablet (40 mg total) by mouth daily. 90 tablet 1  . gabapentin (NEURONTIN) 300 MG capsule Take 1 capsule (300 mg total) by mouth at bedtime. 90 capsule 1  . hydrALAZINE (APRESOLINE) 25 MG tablet Take 1 tablet (25 mg total) by mouth 2 (two) times daily. 270 tablet 3  . HYDROcodone-acetaminophen (NORCO) 7.5-325 MG per tablet 1 tablet every 4 (four) hours as needed.  0  . isosorbide dinitrate (ISOCHRON) 40 MG CR tablet Take 1/2 tablet (20mg  total) twice a day. 45 tablet 3  . KLOR-CON M20 20 MEQ tablet Take 1 tablet by mouth  daily 90 tablet 1  . LANOXIN 62.5 MCG TABS Take 1 tablet by mouth  every day 90 tablet 0  . Liniments (SALONPAS) PADS Apply 1 each topically as needed.    Marland Kitchen  losartan (COZAAR) 50 MG tablet Take 1 tablet (50 mg total) by mouth daily. 90 tablet 1  . meclizine (ANTIVERT) 12.5 MG tablet Take 1 tablet (12.5 mg total) by mouth 2 (two) times daily as needed for dizziness. 30 tablet 2  . methocarbamol (ROBAXIN) 500 MG tablet Take 1 tablet (500 mg total) by mouth 3 (three) times daily. 30 tablet 0  . metolazone (ZAROXOLYN) 2.5 MG tablet Take 2.5 mg by mouth once a week. Uses on Wednesday    . metoprolol succinate (TOPROL-XL) 50 MG 24 hr tablet Take 100 mg by mouth daily. Take with or immediately following a meal.    . Multiple Vitamins-Minerals (CENTRUM SILVER ADULT 50+) TABS Take 1 tablet by mouth once a week.    . ondansetron (ZOFRAN) 4 MG tablet Take 4 mg by mouth daily as needed for nausea.    . polyethylene glycol powder (MIRALAX) powder Take 17 g by mouth daily as needed for mild constipation.     Marland Kitchen SYNTHROID 75 MCG tablet Take 1  tablet by mouth  daily 90 tablet 0  . temazepam (RESTORIL) 30 MG capsule Take 30 mg by mouth at bedtime as needed for sleep.    Marland Kitchen warfarin (COUMADIN) 5 MG tablet Take as directed by coumadin clinic 10 tablet 0   No current facility-administered medications for this visit.   Facility-Administered Medications Ordered in Other Visits  Medication Dose Route Frequency Provider Last Rate Last Dose  . epoetin alfa (EPOGEN,PROCRIT) injection 40,000 Units  40,000 Units Subcutaneous Once Farrel Gobble, MD        Past Medical History  Diagnosis Date  . DJD (degenerative joint disease) of lumbar spine   . Chronic kidney disease, stage I   . Esophageal reflux   . Other and unspecified hyperlipidemia   . Other primary cardiomyopathies   . Obesity, unspecified   . Unspecified hypothyroidism   . DM (diabetes mellitus)   . Essential hypertension, benign   . 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 Pain in joint, pelvic region and thigh   . Insomnia, unspecified   . Iron deficiency 10/04/2011  . IDA (iron deficiency anemia)     Parenteral iron/[name]  . CHF (congestive heart failure)   . COPD (chronic obstructive pulmonary disease)   . Heart murmur   . ICD (implantable cardiac defibrillator) in place   . Adenomatous polyp of colon 12/16/2002    Dr. [name]/[facility]  . Hyperlipemia   . Hemolysis 09/24/2012  . Cancer   . Valvular disease   . Thyroid cancer     remote thyroidectomy, no recurrence    Past Surgical History  Procedure Laterality Date  . Thyroidectomy    . Pacemaker placed  2004  . Aortic and mitral valve replacement  2001  . Right breast cyst removed      benign   . Right cataract removed      2005  . Defibrillator implanted 2006    . Cardiac valve replacement    . Cardiac catheterization    . Insert / replace / remove pacemaker    . Esophagogastroduodenoscopy  06/12/2007    Rourk- normal esophagus,  small hiatal hernia, otherwise normal stomach, D1, D2  . Colonoscopy  06/12/2007    Rourk- Normal rectum/Normal colon  . Colonoscopy  12/16/02    small hemorrhoids  . Colonoscopy  06/20/2012    Procedure: COLONOSCOPY;  Surgeon: Daneil Dolin, MD;  Location: [facility]  ENDO [facility]ITE;  Service: Endoscopy;  Laterality: N/A;  10:45  . Doppler echocardiography  2012  . Doppler echocardiography  05,06,07,08,09,2011    Family History  Problem Relation Age of Onset  . Cancer Mother     behind pancres  . Heart disease Father   . Heart disease Sister     Heart attack  . Heart disease Brother   . Diabetes Brother     History   Social History  . Marital Status: Widowed    Spouse Name: N/A    Number of Children: 0  . Years of Education: N/A   Occupational History  . retired    Social History Main Topics  . Smoking status: Former Smoker -- 0.75 packs/day for 40 years    Types: Cigarettes    Quit date: 03/08/1987  . Smokeless tobacco: Never Used  . Alcohol Use: No  . Drug Use: No  . Sexual Activity: Not on file   Other Topics Concern  . Not on file   Social History Narrative   From [place] (2004)    Review of systems: She has chronic dyspnea, likely functional class 2-3 exertional dyspnea. She does not become short of breath dressing but does when she tries to make the bed or perform housework. This is unchanged for many years. She used to weigh herself on a daily basis but is no longer doing that. She does not have edema of the lower extremities, at least not recently. She denies chest pain. She does not have orthopnea or PND and has not felt palpitations. She has not had recent bleeding complications and denies a history of stroke/TIA.  PHYSICAL EXAM BP 110/60 mmHg  Pulse 88  Resp 16  Ht 5\' 7"  (1.702 m)  Wt 232 lb 8 oz (105.461 kg)  BMI 36.41 kg/m2 General: Alert, oriented x3, no distress, moderate to severely obese Head: no evidence of trauma, PERRL, EOMI, no exophtalmos or lid  lag, no myxedema, no xanthelasma; normal ears, nose and oropharynx Neck: normal jugular venous pulsations and no hepatojugular reflux; brisk carotid pulses without delay and no carotid bruits Chest: clear to auscultation, no signs of consolidation by percussion or palpation, normal fremitus, symmetrical and full respiratory excursions. At the left subclavian pacemaker site shows 2 scars that are both well-healed. There are large distended collateral veins overlying the left shoulder the entire left anterior chest and the left cervical area consistent with an occluded left subclavian/brachiocephalic vein Cardiovascular: normal position and quality of the apical impulse, regular rhythm, very crisp prosthetic valve sounds, grade 2/6 systolic ejection murmur in the aortic focus, no diastolic rumble or murmur , No rubs or gallops Abdomen: no tenderness or distention, no masses by palpation, no abnormal pulsatility or arterial bruits, normal bowel sounds, no hepatosplenomegaly Extremities: no clubbing, cyanosis or edema; 2+ radial, ulnar and brachial pulses bilaterally; 2+ right femoral, posterior tibial and dorsalis pedis pulses; 2+ left femoral, posterior tibial and dorsalis pedis pulses; no subclavian or femoral bruits Neurological: grossly nonfocal   EKG: AV sequential pacing, QRS >190 ms  Lipid Panel     Component Value Date/Time   CHOL 130 09/14/2014 1239   TRIG 132 09/14/2014 1239   HDL 35* 09/14/2014 1239   CHOLHDL 3.7 09/14/2014 1239   VLDL 26 09/14/2014 1239   LDLCALC 69 09/14/2014 1239    BMET    Component Value Date/Time   NA 140 09/14/2014 1239   K 4.8 09/14/2014 1239   CL 102 09/14/2014 1239  CO2 28 09/14/2014 1239   GLUCOSE 93 09/14/2014 1239   BUN 52* 09/14/2014 1239   CREATININE 2.43* 09/14/2014 1239   CREATININE 2.43* 04/05/2014 1346   CALCIUM 9.4 09/14/2014 1239   CALCIUM 9.5 06/12/2010 0459   GFRNONAA 18* 09/14/2014 1239   GFRNONAA 18* 04/05/2014 1346   GFRAA 21*  09/14/2014 1239   GFRAA 21* 04/05/2014 1346     ASSESSMENT AND PLAN  Mrs. Trussel has an extremely complicated cardiac history and an complex problems regarding her cardiac rhythm device are becoming an issue as she is approaching ERI. She is pacemaker dependent with slowly worsening right ventricular pacing thresholds and has congestive heart failure with severely depressed left ventricular ejection fraction. She has 3 separate leads in her right ventricle and attempts at upgrading to a CRT device failed in the past from the left subclavian approach.  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 She has had 2 separate open heart surgeries which make epicardial lead placement difficult. The need for anticoagulation therapy for dual mechanical heart valve prostheses further complicates the situation.   We'll ask Dr. Tanna Furry opinion whether it is worth to consider implanting an entirely new system from the right subclavian approach. Certainly, my inclination is to just perform a generator change out, but would definitely defer to his expertise.  Non-ischemic cardiomyopathy with ICD The patient has severe nonischemic cardiomyopathy, likely secondary to valvular heart disease. Her left ventricular systolic function has been stable for the last many years. She is on appropriate vasodilator and beta blocker therapy. She is also on digoxin, which needs to be used with great caution because of renal insufficiency.  Chronic systolic heart failure She appears fairly stable NYHA class II-III heart failure and as far as I can tell today by clinical exam and thoracic impedance, she is euvolemic. Use of diuretics has to be cautious because she also has fairly significant renal failure. Her device thoracic impedance recording suggests that she had hypervolemia in July and August, but this has now returned to baseline. She is on angiotensin receptor blockers despite her advanced  kidney disease.  AICD (automatic cardioverter/defibrillator) approaching ERI Mrs. Stobaugh has had numerous interventions via her left subclavian pocket. Her pacemaker was implanted in 2004. This was upgraded to a dual-chamber defibrillator in 2006, but she had to undergo re\re intervention in 2007 to place a second defibrillator lead (Sprint Fidelis [id number] recall). 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 The current device is functioning normally, but the generator is approaching ERI. She benefit clinically from a grade to a cardiac resynchronization device. This cannot be easily done. She has had 2 previous open heart surgeries which make placement of an epicardial lead via surgical approach more challenging. There is no option to place another endocardial lead from the left side. The only option would be to place an entirely new system via the right subclavian approach. The risks of such a procedure would be substantial, since she has 2 prosthetic valves and would be at increased risk of bleeding or stroke. In addition, the potential for infection could have catastrophic consequences and the settings of 2 mechanical valves in a patient that is pacemaker dependent. Personally, I think the best approach would be to simply perform a generator change out. Consider discussing with electrophysiology.  S/P MVR (mitral valve replacement) Device size unknown. Reportedly a  St. Jude valve. Gradients were slightly higher than expected on her last echocardiogram, but both the patient's heart rates and hemoglobin level could have a big impact on this. Unfortunately these were not documented in the echo report. It is reasonable to reevaluate her echocardiogram, to make sure she does not have evidence of prosthetic valve stenosis.  S/P AVR (aortic valve replacement) Device size unknown. Reportedly  a St. Jude valve.  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 Anemia will be poorly tolerated due to the presence of a mechanical mitral valve with increased gradients and left ventricular systolic failure. Orders Placed This Encounter  Procedures  . 2D Echocardiogram without contrast   Meds ordered this encounter  Medications  . hydrALAZINE (APRESOLINE) 25 MG tablet    Sig: Take 1 tablet (25 mg total) by mouth 2 (two) times daily.    Dispense:  270 tablet    Refill:  Montverde Forney Kleinpeter, MD, [facility] [phone] office [phone] pager

## 2014-10-09 ENCOUNTER — Telehealth: Payer: Self-pay | Admitting: *Deleted

## 2014-10-09 NOTE — Telephone Encounter (Signed)
C/O dizziness since switching Bidil to Hydralazine and Isosorbide.  Starts in the mornings after her AM meds and lasts through most of the day.  Would like to stop taking the meds and see if she feels better.  Will review with an on call MD in Dr. Lurline Del absence for review and advise.  Patient voiced understanding.

## 2014-10-12 ENCOUNTER — Other Ambulatory Visit: Payer: Self-pay | Admitting: Family Medicine

## 2014-10-12 IMAGING — CR DG CHEST 2V
2 series · 2 of 2 positions shown · non-contrast
Comparison: Prior chest x-ray 03/29/2012

CLINICAL DATA: Right-sided chest pain, history of CHF, cough

EXAM:
CHEST  2 VIEW

[view not recorded (1 of 2)]
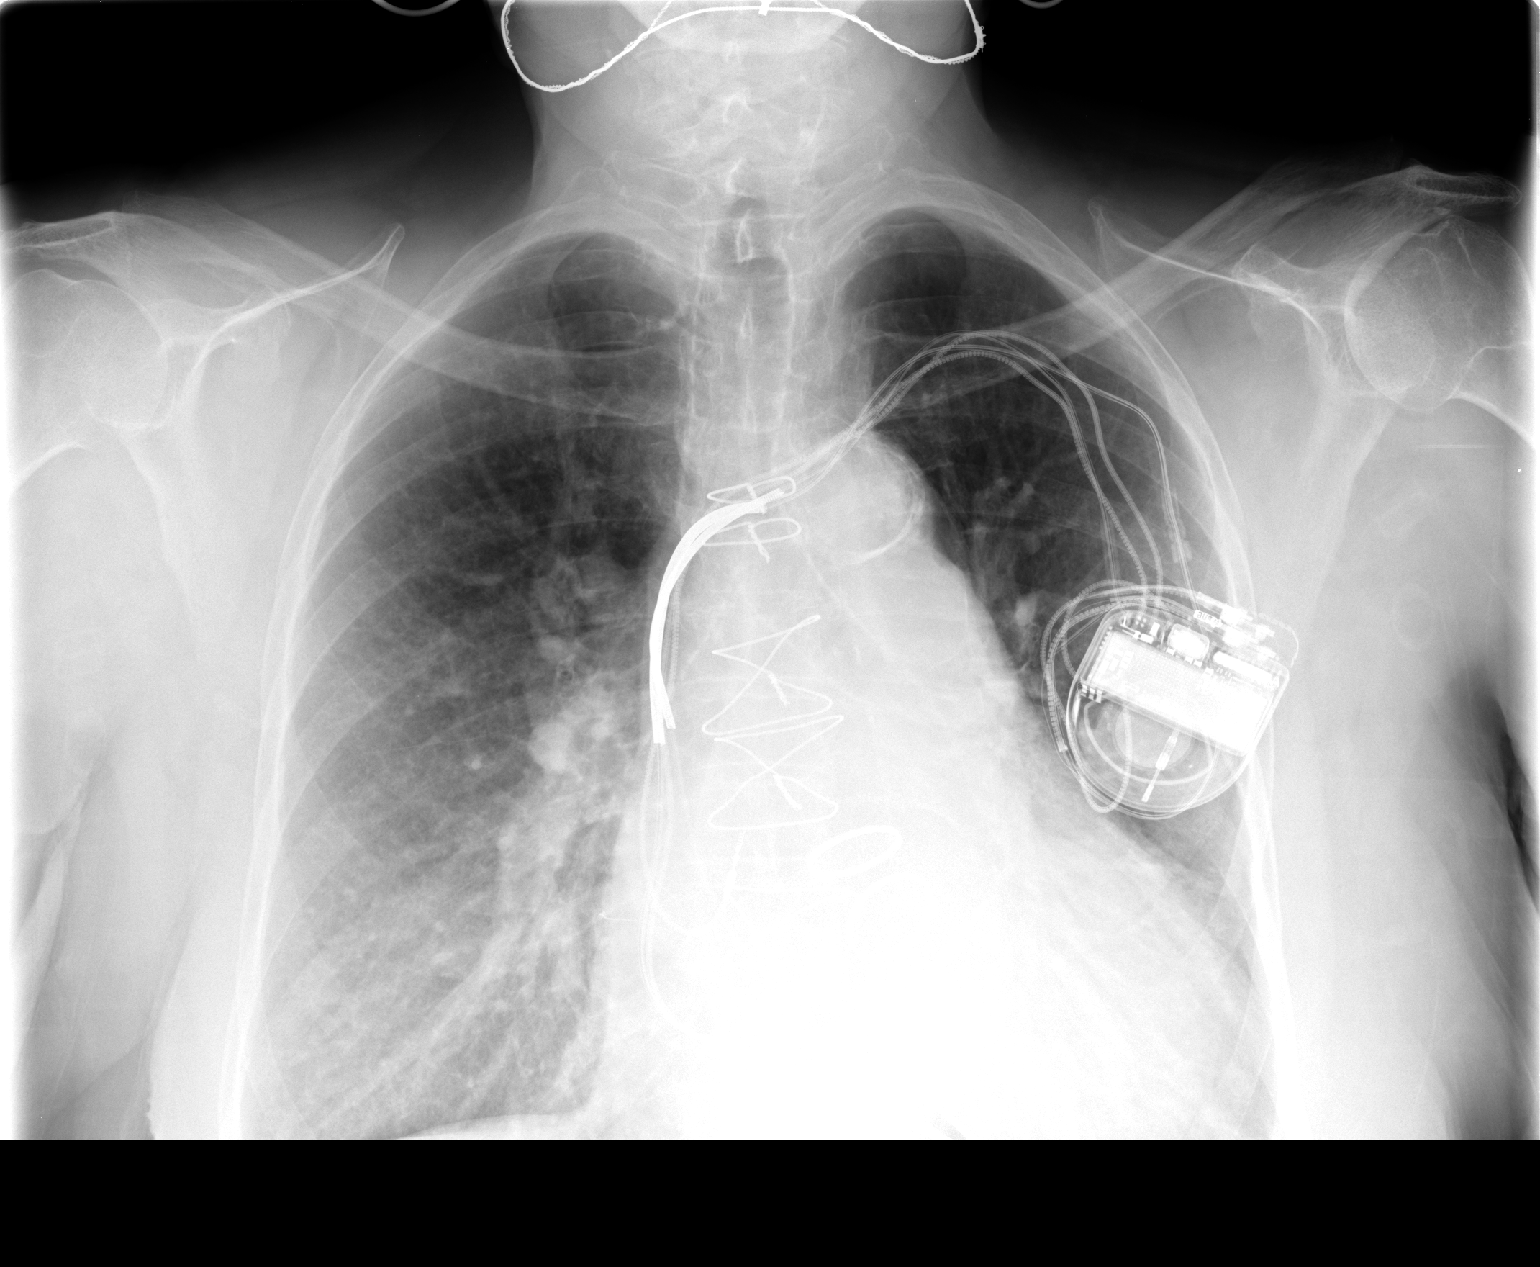

[view not recorded (2 of 2)]
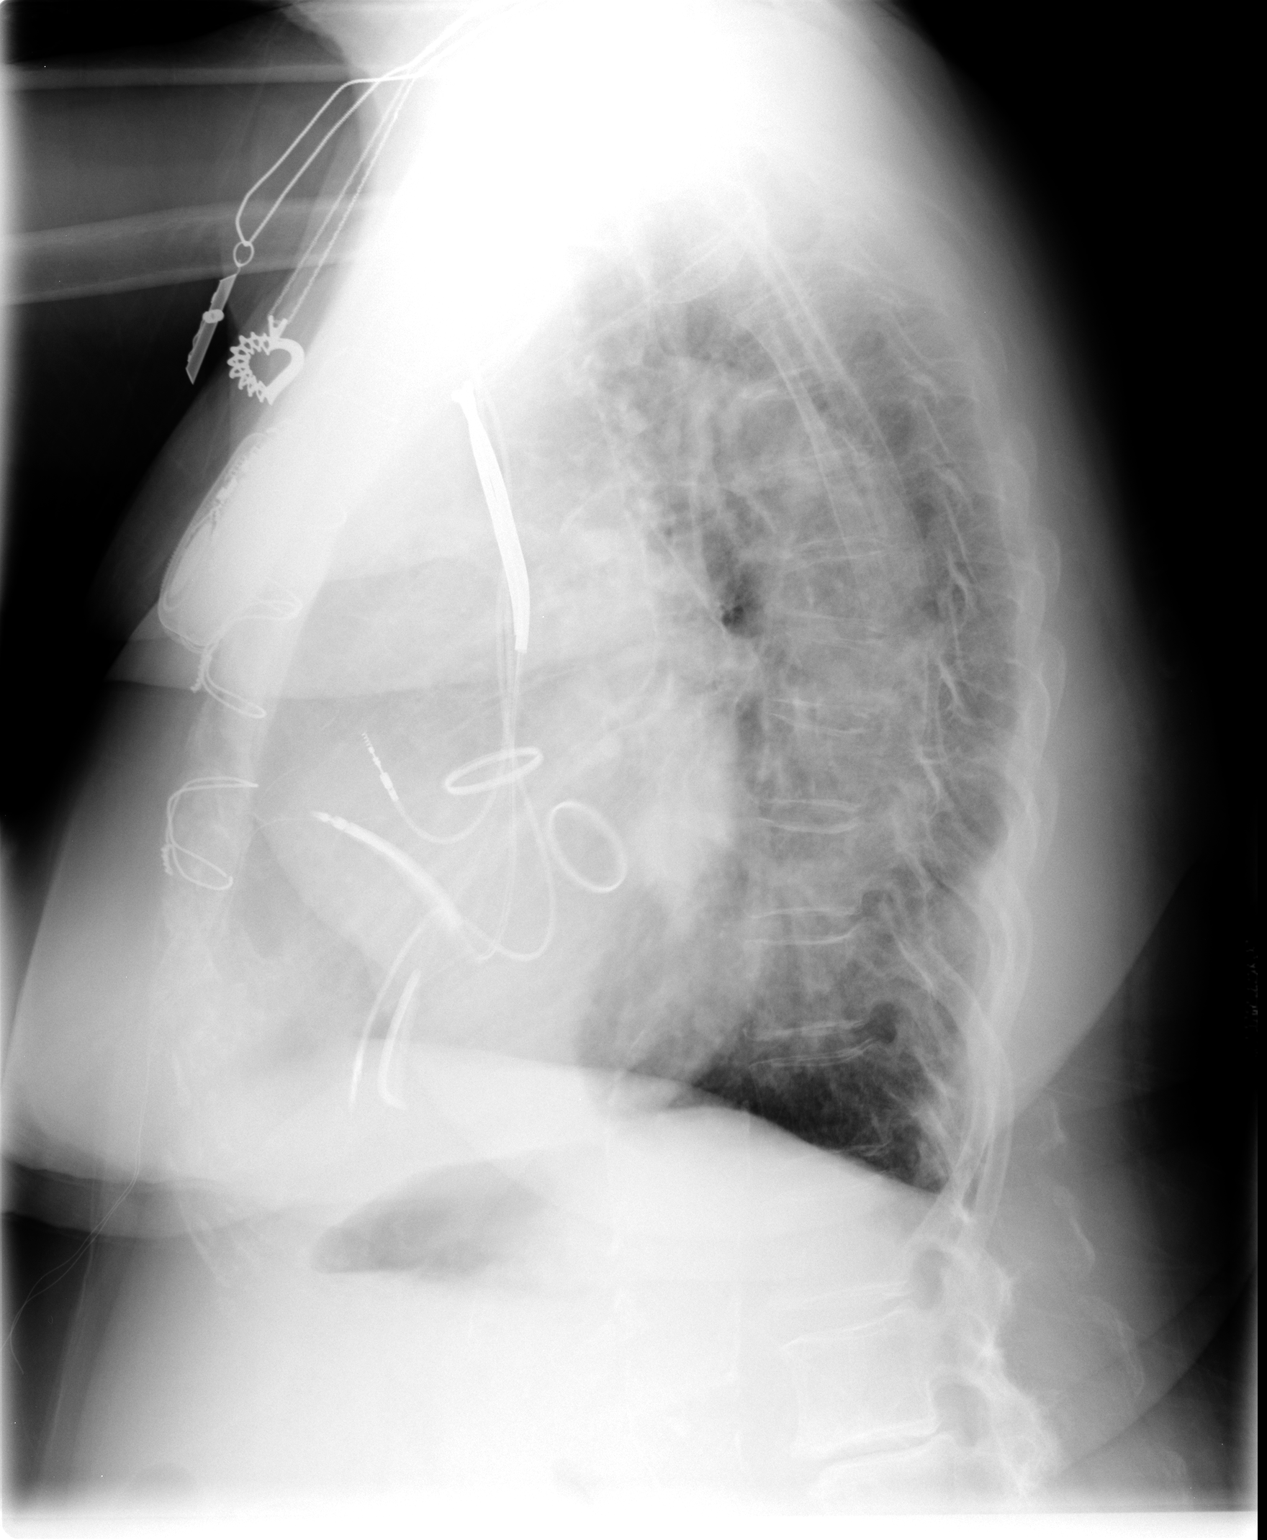

[2 of 2 positions shown; findings below may reference images not displayed]

FINDINGS: Stable cardiomegaly. Atherosclerotic vascular calcifications.
Patient is status post median sternotomy with evidence of aortic and
mitral valve replacement. A left subclavian approach cardiac rhythm
maintenance device is present. Leads project over the right atrium
and ventricle. Pulmonary vascular congestion without overt edema. No
large pleural effusion. Background changes of COPD. No pneumothorax
or focal airspace consolidation. No acute osseous abnormality.
Extensive atherosclerotic vascular calcifications.
IMPRESSION: 1. Pulmonary vascular congestion without overt edema.
2. No definite focal airspace consolidation.
3. Stable cardiomegaly, aortic atherosclerosis and postoperative
changes.

## 2014-10-13 ENCOUNTER — Ambulatory Visit (INDEPENDENT_AMBULATORY_CARE_PROVIDER_SITE_OTHER): Payer: Medicare Other | Admitting: Family Medicine

## 2014-10-13 ENCOUNTER — Encounter: Payer: Self-pay | Admitting: Family Medicine

## 2014-10-13 VITALS — BP 114/74 | HR 91 | Resp 16 | Ht 67.0 in | Wt 232.8 lb

## 2014-10-13 DIAGNOSIS — E039 Hypothyroidism, unspecified: Secondary | ICD-10-CM | POA: Diagnosis not present

## 2014-10-13 DIAGNOSIS — E119 Type 2 diabetes mellitus without complications: Secondary | ICD-10-CM | POA: Diagnosis not present

## 2014-10-13 DIAGNOSIS — I10 Essential (primary) hypertension: Secondary | ICD-10-CM

## 2014-10-13 DIAGNOSIS — M549 Dorsalgia, unspecified: Secondary | ICD-10-CM

## 2014-10-13 DIAGNOSIS — Z23 Encounter for immunization: Secondary | ICD-10-CM

## 2014-10-13 DIAGNOSIS — E785 Hyperlipidemia, unspecified: Secondary | ICD-10-CM | POA: Diagnosis not present

## 2014-10-13 DIAGNOSIS — R110 Nausea: Secondary | ICD-10-CM | POA: Diagnosis not present

## 2014-10-13 MED ORDER — FENTANYL 25 MCG/HR TD PT72: 25.0000 ug | MEDICATED_PATCH | TRANSDERMAL | Status: DC

## 2014-10-13 NOTE — Progress Notes (Signed)
   Subjective:    Patient ID: [name] [name], female    DOB: 03/13/1932, 81 y.o.   MRN: [id number]  HPI The PT is here for follow up and re-evaluation of chronic medical conditions, medication management and review of any available recent lab and radiology data.  Preventive health is updated, specifically  Cancer screening and Immunization.   Questions or concerns regarding consultations or procedures which the PT has had in the interim are  Addressed.Has upcoming appt in [place] with Dr Lovena Le, consideration is being given to replacing her pacemaker , though not likely to  Go through with this. 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  C/o uncontrolled an disabling back and hip pain    Review of Systems See HPI Denies recent fever or chills.c/o fatigue Denies sinus pressure, nasal congestion, ear pain or sore throat. Denies chest congestion, productive cough or wheezing. Denies chest pains, palpitations and leg swelling Denies abdominal pain, vomiting,diarrhea or constipation.   Denies dysuria, frequency, hesitancy or incontinence. . Denies headaches, seizures, numbness, or tingling. Denies depression, anxiety or insomnia. Denies skin break down or rash.        Objective:   Physical Exam  BP 114/74 mmHg  Pulse 91  Resp 16  Ht 5\' 7"  (1.702 m)  Wt 232 lb 12.8 oz (105.597 kg)  BMI 36.45 kg/m2  SpO2 93% Patient alert and oriented and in no cardiopulmonary distress.Pt in pain  HEENT: No facial asymmetry, EOMI,   oropharynx pink and moist.  Neck supple no JVD, no mass.  Chest: Clear to  auscultation bilaterally.  CVS: S1, S2 systolic  murmurs, no [id number].Regular rate.  ABD: Soft non tender.   Ext: No edema  MS: decreased  ROM spine, shoulders, hips and knees.  Skin: Intact, no ulcerations or rash noted.  Psych: Good eye contact, normal affect. Memory intact mildly anxious or depressed appearing.  CNS: CN 2-12 intact, power,  normal throughout.no focal deficits noted.       Assessment & Plan:  Back pain without radiation Uncontrolled , will change to fentanyl patch which she has had success with in the past, she is to d/c hydrocodone and Dr Arlyce Harman will also be notified   Hyperlipidemia LDL goal <70 Controlled, no change in medication Hyperlipidemia:Low fat diet discussed and encouraged.     Nausea without vomiting Unchanged due to cholelithiasois, risk of surgery outweighs benefit   Need for vaccination with 13-polyvalent pneumococcal conjugate vaccine Vaccine administered at visit.    Hypothyroidism Controlled, no change in medication    Type 2 diabetes mellitus Controlled, no change in management    Essential hypertension Controlled, no change in medication

## 2014-10-13 NOTE — Patient Instructions (Addendum)
Annual wellness  in  May 20 or after, call if you need me before  New for back pain is the fentanyl patch which you have used in the past. STOP hydrocodone while using thsi. I am letting Dr Luna Glasgow know that I have prescribed the patch also   Labs are excelllent   Prevnar today  Pain contract today  Chem 7 and EGFR and hyBa1c and TSH in 3.5 month, also microalb May 15 or after

## 2014-10-14 ENCOUNTER — Encounter: Payer: Self-pay | Admitting: Internal Medicine

## 2014-10-14 ENCOUNTER — Telehealth: Payer: Self-pay

## 2014-10-14 NOTE — Telephone Encounter (Signed)
Patient aware she already has this and took it with her yesterday

## 2014-10-15 ENCOUNTER — Telehealth: Payer: Self-pay | Admitting: *Deleted

## 2014-10-15 NOTE — Telephone Encounter (Signed)
-----   Message from Pixie Casino, MD sent at 10/13/2014  5:34 PM EST ----- Pamala Hurry-  She can stop the hydralazine and isosorbide for a few days and see if it gets better. May need to go back to bidil or other vasodilators.  Dr. Lemmie Evens  ----- Message -----    From: Tressa Busman, CMA    Sent: 10/13/2014   8:58 AM      To: Sanda Klein, MD  I took this message Friday but it didn't appear to get forwarded.  Please review and advise.  C/O dizziness since switching Bidil to Hydralazine and Isosorbide. Starts in the mornings after her AM meds and lasts through most of the day. Would like to stop taking the meds and see if she feels better. Will review with an on call MD in Dr. Lurline Del absence for review and advise. Patient voiced understanding.  Thank you Pamala Hurry

## 2014-10-15 NOTE — Telephone Encounter (Signed)
States she held the Hydralazine and Isosorbide for several days on her own.  Dizziness resolved - restarted meds and had only one mild dizzy spell.  Will continue meds and call if she has any problems.

## 2014-10-17 NOTE — Assessment & Plan Note (Signed)
Controlled , no change in management 

## 2014-10-17 NOTE — Assessment & Plan Note (Signed)
Unchanged due to cholelithiasois, risk of surgery outweighs benefit

## 2014-10-17 NOTE — Assessment & Plan Note (Signed)
Controlled, no change in medication  

## 2014-10-17 NOTE — Assessment & Plan Note (Signed)
Vaccine administered at visit.  

## 2014-10-17 NOTE — Assessment & Plan Note (Signed)
Uncontrolled , will change to fentanyl patch which she has had success with in the past, she is to d/c hydrocodone and Dr Arlyce Harman will also be notified

## 2014-10-17 NOTE — Assessment & Plan Note (Signed)
Controlled, no change in medication Hyperlipidemia:Low fat diet discussed and encouraged.  \ 

## 2014-10-22 ENCOUNTER — Encounter (HOSPITAL_COMMUNITY): Payer: Self-pay

## 2014-10-22 ENCOUNTER — Encounter (HOSPITAL_BASED_OUTPATIENT_CLINIC_OR_DEPARTMENT_OTHER): Payer: Medicare Other

## 2014-10-22 ENCOUNTER — Encounter (HOSPITAL_COMMUNITY): Payer: Medicare Other | Attending: Hematology & Oncology

## 2014-10-22 DIAGNOSIS — D638 Anemia in other chronic diseases classified elsewhere: Secondary | ICD-10-CM | POA: Diagnosis not present

## 2014-10-22 DIAGNOSIS — D631 Anemia in chronic kidney disease: Secondary | ICD-10-CM | POA: Diagnosis not present

## 2014-10-22 DIAGNOSIS — E611 Iron deficiency: Secondary | ICD-10-CM

## 2014-10-22 DIAGNOSIS — N184 Chronic kidney disease, stage 4 (severe): Secondary | ICD-10-CM

## 2014-10-22 LAB — CBC WITH DIFFERENTIAL/PLATELET
Basophils Absolute: 0 10*3/uL (ref 0.0–0.1)
Basophils Relative: 0 % (ref 0–1)
Eosinophils Absolute: 0 10*3/uL (ref 0.0–0.7)
Eosinophils Relative: 0 % (ref 0–5)
HCT: 33.3 % — ABNORMAL LOW (ref 36.0–46.0)
Hemoglobin: 10.6 g/dL — ABNORMAL LOW (ref 12.0–15.0)
Lymphocytes Relative: 19 % (ref 12–46)
Lymphs Abs: 1.1 10*3/uL (ref 0.7–4.0)
MCH: 30.6 pg (ref 26.0–34.0)
MCHC: 31.8 g/dL (ref 30.0–36.0)
MCV: 96.2 fL (ref 78.0–100.0)
Monocytes Absolute: 0.5 10*3/uL (ref 0.1–1.0)
Monocytes Relative: 8 % (ref 3–12)
Neutro Abs: 4.4 10*3/uL (ref 1.7–7.7)
Neutrophils Relative %: 73 % (ref 43–77)
Platelets: 222 10*3/uL (ref 150–400)
RBC: 3.46 MIL/uL — ABNORMAL LOW (ref 3.87–5.11)
RDW: 15.5 % (ref 11.5–15.5)
WBC: 6.1 10*3/uL (ref 4.0–10.5)

## 2014-10-22 MED ORDER — EPOETIN ALFA 40000 UNIT/ML IJ SOLN
40000.0000 [IU] | Freq: Once | INTRAMUSCULAR | Status: AC
  Administered 2014-10-22: 40000 [IU] via SUBCUTANEOUS

## 2014-10-22 MED ORDER — EPOETIN ALFA 40000 UNIT/ML IJ SOLN
INTRAMUSCULAR | Status: AC
  Filled 2014-10-22: qty 1

## 2014-10-22 NOTE — Progress Notes (Signed)
LABS FOR [facility]CD 

## 2014-10-22 NOTE — Patient Instructions (Signed)
[facility] Discharge Instructions for Patients Receiving Chemotherapy  Today your received Procrit 40,000 units.  Return as scheduled for lab work and injections.  The clinic phone number is [phone]. Office hours are Monday-Friday 8:30am-5:00pm.  BELOW ARE SYMPTOMS THAT SHOULD BE REPORTED IMMEDIATELY:  *FEVER GREATER THAN 101.0 F  *CHILLS WITH OR WITHOUT FEVER  NAUSEA AND VOMITING THAT IS NOT CONTROLLED WITH YOUR NAUSEA MEDICATION  *UNUSUAL SHORTNESS OF BREATH  *UNUSUAL BRUISING OR BLEEDING  TENDERNESS IN MOUTH AND THROAT WITH OR WITHOUT PRESENCE OF ULCERS  *URINARY PROBLEMS  *BOWEL PROBLEMS  UNUSUAL RASH Items with * indicate a potential emergency and should be followed up as soon as possible. If you have an emergency after office hours please contact your primary care physician or go to the nearest emergency department.  Please call the clinic during office hours if you have any questions or concerns.   You may also contact the Patient Navigator at [phone] should you have any questions or need assistance in obtaining follow up care. _____________________________________________________________________ Have you asked about our STAR program?    STAR stands for Survivorship Training and Rehabilitation, and this is a nationally recognized cancer care program that focuses on survivorship and rehabilitation.  Cancer and cancer treatments may cause problems, such as, pain, making you feel tired and keeping you from doing the things that you need or want to do. Cancer rehabilitation can help. Our goal is to reduce these troubling effects and help you have the best quality of life possible.  You may receive a survey from a nurse that asks questions about your current state of health.  Based on the survey results, all eligible patients will be referred to the [facility] program for an evaluation so we can better serve you! A frequently asked questions sheet is  available upon request.

## 2014-10-22 NOTE — Progress Notes (Signed)
1300:  [name] [name] presents today for injection per the provider's orders.  Procrit administration without incident; see MAR for injection details.  Patient tolerated procedure well and without incident.  No questions or complaints noted at this time.

## 2014-10-26 ENCOUNTER — Ambulatory Visit (INDEPENDENT_AMBULATORY_CARE_PROVIDER_SITE_OTHER): Payer: Medicare Other | Admitting: *Deleted

## 2014-10-26 DIAGNOSIS — Z7901 Long term (current) use of anticoagulants: Secondary | ICD-10-CM | POA: Diagnosis not present

## 2014-10-26 DIAGNOSIS — Z5181 Encounter for therapeutic drug level monitoring: Secondary | ICD-10-CM

## 2014-10-26 DIAGNOSIS — Z952 Presence of prosthetic heart valve: Secondary | ICD-10-CM

## 2014-10-26 DIAGNOSIS — N6452 Nipple discharge: Secondary | ICD-10-CM | POA: Diagnosis not present

## 2014-10-26 DIAGNOSIS — Z954 Presence of other heart-valve replacement: Secondary | ICD-10-CM | POA: Diagnosis not present

## 2014-10-26 LAB — POCT INR: INR: 3.5

## 2014-11-02 DIAGNOSIS — L609 Nail disorder, unspecified: Secondary | ICD-10-CM | POA: Diagnosis not present

## 2014-11-02 DIAGNOSIS — E1342 Other specified diabetes mellitus with diabetic polyneuropathy: Secondary | ICD-10-CM | POA: Diagnosis not present

## 2014-11-02 DIAGNOSIS — M79671 Pain in right foot: Secondary | ICD-10-CM | POA: Diagnosis not present

## 2014-11-02 DIAGNOSIS — B351 Tinea unguium: Secondary | ICD-10-CM | POA: Diagnosis not present

## 2014-11-02 DIAGNOSIS — L851 Acquired keratosis [keratoderma] palmaris et plantaris: Secondary | ICD-10-CM | POA: Diagnosis not present

## 2014-11-05 ENCOUNTER — Other Ambulatory Visit: Payer: Self-pay

## 2014-11-05 DIAGNOSIS — I10 Essential (primary) hypertension: Secondary | ICD-10-CM | POA: Diagnosis not present

## 2014-11-05 DIAGNOSIS — D649 Anemia, unspecified: Secondary | ICD-10-CM | POA: Diagnosis not present

## 2014-11-05 DIAGNOSIS — E559 Vitamin D deficiency, unspecified: Secondary | ICD-10-CM | POA: Diagnosis not present

## 2014-11-05 DIAGNOSIS — N183 Chronic kidney disease, stage 3 (moderate): Secondary | ICD-10-CM | POA: Diagnosis not present

## 2014-11-05 DIAGNOSIS — R809 Proteinuria, unspecified: Secondary | ICD-10-CM | POA: Diagnosis not present

## 2014-11-05 DIAGNOSIS — Z79899 Other long term (current) drug therapy: Secondary | ICD-10-CM | POA: Diagnosis not present

## 2014-11-05 MED ORDER — GLUCOSE BLOOD VI STRP
ORAL_STRIP | Status: DC
Start: 2014-11-05 — End: 2014-11-10

## 2014-11-06 ENCOUNTER — Ambulatory Visit (HOSPITAL_COMMUNITY): Admission: RE | Admit: 2014-11-06 | Payer: Medicare Other | Source: Ambulatory Visit

## 2014-11-06 ENCOUNTER — Other Ambulatory Visit: Payer: Self-pay | Admitting: Family Medicine

## 2014-11-06 ENCOUNTER — Telehealth: Payer: Self-pay | Admitting: Family Medicine

## 2014-11-06 NOTE — Telephone Encounter (Signed)
Last fill was hydrocodone 7.5/325 filled on 09/04/2014 for #120 tabs

## 2014-11-06 NOTE — Telephone Encounter (Signed)
Pls download her narcotic med profile, and send a tele message to me as to when she last had pain meds dispensed and at what dose and number of tabs. Dr Luna Glasgow was prescribing before. Pls also leave the printed list in my box Let her  know that I am aware of her request and will be in touch next week , thanks

## 2014-11-10 ENCOUNTER — Other Ambulatory Visit: Payer: Self-pay | Admitting: Family Medicine

## 2014-11-10 NOTE — Telephone Encounter (Signed)
I spoke directly with the patient, she states that she takes one pain pill once daily, most of the time, and sometimes needs to take two. Not in need of a script currently. Will enter new script for one daily, so can be filled when she calls in for this , I have encouraged her to take least needed, and try to stay on a schedule

## 2014-11-10 NOTE — Addendum Note (Signed)
Addended by: Fayrene Helper on: 11/10/2014 08:42 AM   Modules accepted: Orders

## 2014-11-10 NOTE — Telephone Encounter (Signed)
New pain med entered historically

## 2014-11-11 DIAGNOSIS — N184 Chronic kidney disease, stage 4 (severe): Secondary | ICD-10-CM | POA: Diagnosis not present

## 2014-11-11 DIAGNOSIS — N2581 Secondary hyperparathyroidism of renal origin: Secondary | ICD-10-CM | POA: Diagnosis not present

## 2014-11-11 DIAGNOSIS — I10 Essential (primary) hypertension: Secondary | ICD-10-CM | POA: Diagnosis not present

## 2014-11-11 DIAGNOSIS — R809 Proteinuria, unspecified: Secondary | ICD-10-CM | POA: Diagnosis not present

## 2014-11-12 ENCOUNTER — Encounter (HOSPITAL_COMMUNITY): Payer: Medicare Other

## 2014-11-13 ENCOUNTER — Ambulatory Visit (INDEPENDENT_AMBULATORY_CARE_PROVIDER_SITE_OTHER): Payer: Medicare Other | Admitting: *Deleted

## 2014-11-13 ENCOUNTER — Encounter (HOSPITAL_COMMUNITY): Payer: Medicare Other | Attending: Hematology

## 2014-11-13 ENCOUNTER — Encounter (HOSPITAL_COMMUNITY): Payer: Self-pay | Admitting: Hematology & Oncology

## 2014-11-13 ENCOUNTER — Encounter (HOSPITAL_BASED_OUTPATIENT_CLINIC_OR_DEPARTMENT_OTHER): Payer: Medicare Other

## 2014-11-13 ENCOUNTER — Encounter (HOSPITAL_COMMUNITY): Payer: Medicare Other | Attending: Internal Medicine | Admitting: Hematology & Oncology

## 2014-11-13 DIAGNOSIS — D649 Anemia, unspecified: Secondary | ICD-10-CM | POA: Insufficient documentation

## 2014-11-13 DIAGNOSIS — D509 Iron deficiency anemia, unspecified: Secondary | ICD-10-CM

## 2014-11-13 DIAGNOSIS — Z7901 Long term (current) use of anticoagulants: Secondary | ICD-10-CM | POA: Diagnosis not present

## 2014-11-13 DIAGNOSIS — D631 Anemia in chronic kidney disease: Secondary | ICD-10-CM | POA: Diagnosis not present

## 2014-11-13 DIAGNOSIS — I5022 Chronic systolic (congestive) heart failure: Secondary | ICD-10-CM

## 2014-11-13 DIAGNOSIS — Z954 Presence of other heart-valve replacement: Secondary | ICD-10-CM

## 2014-11-13 DIAGNOSIS — N184 Chronic kidney disease, stage 4 (severe): Secondary | ICD-10-CM

## 2014-11-13 DIAGNOSIS — E611 Iron deficiency: Secondary | ICD-10-CM

## 2014-11-13 DIAGNOSIS — I442 Atrioventricular block, complete: Secondary | ICD-10-CM

## 2014-11-13 DIAGNOSIS — D638 Anemia in other chronic diseases classified elsewhere: Secondary | ICD-10-CM | POA: Diagnosis not present

## 2014-11-13 DIAGNOSIS — N183 Chronic kidney disease, stage 3 (moderate): Secondary | ICD-10-CM | POA: Insufficient documentation

## 2014-11-13 DIAGNOSIS — D594 Other nonautoimmune hemolytic anemias: Secondary | ICD-10-CM

## 2014-11-13 LAB — MDC_IDC_ENUM_SESS_TYPE_INCLINIC
Battery Voltage: 2.61 V
Brady Statistic AP VP Percent: 89.55 %
Brady Statistic AP VS Percent: 0.04 %
Brady Statistic AS VP Percent: 10.35 %
Brady Statistic AS VS Percent: 0.06 %
Brady Statistic RA Percent Paced: 89.6 %
Brady Statistic RV Percent Paced: 99.9 %
Date Time Interrogation Session: 20160219132839
HighPow Impedance: 37 Ohm
HighPow Impedance: 47 Ohm
Lead Channel Impedance Value: 342 Ohm
Lead Channel Impedance Value: 437 Ohm
Lead Channel Pacing Threshold Amplitude: 0.75 V
Lead Channel Pacing Threshold Amplitude: 1.265 V
Lead Channel Pacing Threshold Amplitude: 1.75 V
Lead Channel Pacing Threshold Pulse Width: 0.4 ms
Lead Channel Pacing Threshold Pulse Width: 0.4 ms
Lead Channel Pacing Threshold Pulse Width: 0.8 ms
Lead Channel Sensing Intrinsic Amplitude: 1.875 mV
Lead Channel Sensing Intrinsic Amplitude: 10.625 mV
Lead Channel Sensing Intrinsic Amplitude: 10.625 mV
Lead Channel Sensing Intrinsic Amplitude: 2.125 mV
Lead Channel Setting Pacing Amplitude: 2 V
Lead Channel Setting Pacing Amplitude: 2.5 V
Lead Channel Setting Pacing Pulse Width: 0.8 ms
Lead Channel Setting Sensing Sensitivity: 0.3 mV
Zone Setting Detection Interval: 320 ms
Zone Setting Detection Interval: 350 ms
Zone Setting Detection Interval: 360 ms
Zone Setting Detection Interval: 410 ms

## 2014-11-13 LAB — CBC WITH DIFFERENTIAL/PLATELET
Basophils Absolute: 0 10*3/uL (ref 0.0–0.1)
Basophils Relative: 0 % (ref 0–1)
Eosinophils Absolute: 0 10*3/uL (ref 0.0–0.7)
Eosinophils Relative: 0 % (ref 0–5)
HCT: 33.1 % — ABNORMAL LOW (ref 36.0–46.0)
Hemoglobin: 10.6 g/dL — ABNORMAL LOW (ref 12.0–15.0)
Lymphocytes Relative: 18 % (ref 12–46)
Lymphs Abs: 1.1 10*3/uL (ref 0.7–4.0)
MCH: 30.7 pg (ref 26.0–34.0)
MCHC: 32 g/dL (ref 30.0–36.0)
MCV: 95.9 fL (ref 78.0–100.0)
Monocytes Absolute: 0.5 10*3/uL (ref 0.1–1.0)
Monocytes Relative: 9 % (ref 3–12)
Neutro Abs: 4.5 10*3/uL (ref 1.7–7.7)
Neutrophils Relative %: 73 % (ref 43–77)
Platelets: 227 10*3/uL (ref 150–400)
RBC: 3.45 MIL/uL — ABNORMAL LOW (ref 3.87–5.11)
RDW: 15.3 % (ref 11.5–15.5)
WBC: 6.2 10*3/uL (ref 4.0–10.5)

## 2014-11-13 LAB — FERRITIN: Ferritin: 1752 ng/mL — ABNORMAL HIGH (ref 10–291)

## 2014-11-13 MED ORDER — EPOETIN ALFA 40000 UNIT/ML IJ SOLN
40000.0000 [IU] | Freq: Once | INTRAMUSCULAR | Status: AC
  Administered 2014-11-13: 40000 [IU] via SUBCUTANEOUS

## 2014-11-13 MED ORDER — EPOETIN ALFA 40000 UNIT/ML IJ SOLN: 40000.0000 [IU] | Freq: Once | INTRAMUSCULAR | Status: DC

## 2014-11-13 MED ORDER — EPOETIN ALFA 40000 UNIT/ML IJ SOLN
INTRAMUSCULAR | Status: AC
  Filled 2014-11-13: qty 1

## 2014-11-13 NOTE — Progress Notes (Signed)
[name] Nakayama, MD [address] [place] [address]    DIAGNOSIS: Stage III CKD   Anemia   Microangiopathic hemolytic anemia   Iron deficiency   Mechanical mitral and aortic valves   06/10/2012 colonoscopy with a long torturous colon and poor prep  CURRENT THERAPY: ESA's, IV iron replacement   INTERVAL HISTORY: [name] [name] 81 y.o. female returns for follow-up of anemia. She has a mechanical mitral and aortic valves and is felt to have a low grade hemolysis that contributes. She has no major complaints today and she states she feels well. Her appetite is excellent. She had her valves replaced in 2001 and 2004.   Her major complaint today revolves around a bad right knee and she needs to use a cane.  MEDICAL HISTORY: Past Medical History  Diagnosis Date  . DJD (degenerative joint disease) of lumbar spine   . Chronic kidney disease, stage I   . Esophageal reflux   . Other and unspecified hyperlipidemia   . Other primary cardiomyopathies   . Obesity, unspecified   . Unspecified hypothyroidism   . DM (diabetes mellitus)   . Essential hypertension, benign   . 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 Pain in joint, pelvic region and thigh   . Insomnia, unspecified   . Iron deficiency 10/04/2011  . IDA (iron deficiency anemia)     Parenteral iron/[name]  . CHF (congestive heart failure)   . COPD (chronic obstructive pulmonary disease)   . Heart murmur   . ICD (implantable cardiac defibrillator) in place   . Adenomatous polyp of colon 12/16/2002    Dr. [name]/[facility]  . Hyperlipemia   . Hemolysis 09/24/2012  . Cancer   . Valvular disease   . Thyroid cancer     remote thyroidectomy, no recurrence  . CHB (complete heart block) 10/07/2014    has Hypothyroidism; Type 2 diabetes mellitus; Hyperlipidemia LDL goal <70; OBESITY; Essential hypertension; Non-ischemic cardiomyopathy with ICD;  VARICOSE VEINS LOWER EXTREMITIES W/OTH COMPS; GERD; CHOLELITHIASIS; HIP PAIN, RIGHT; DEGENERATIVE JOINT DISEASE, SPINE; Nausea without vomiting; Allergic rhinitis; Low back pain; Iron deficiency; Papilloma of breast; Warfarin-induced coagulopathy; Cardiorenal syndrome with renal failure; S/P AVR (aortic valve replacement); S/P MVR (mitral valve replacement); AICD (automatic cardioverter/defibrillator) present; CKD (chronic kidney disease), stage IV; Chronic anticoagulation, on coumadin for Mechanical AVR and MVR; Upper leg pain, rt. from use of BSC?  ; COPD (chronic obstructive pulmonary disease); Chronic systolic heart failure; H/O adenomatous polyp of colon; High risk medication use; Routine general medical examination at a health care facility; Hemolysis; Spinal stenosis of lumbar region; OA (osteoarthritis) of knee; Rotator cuff syndrome of left shoulder; Fatigue due to treatment; Encounter for therapeutic drug monitoring; Osteopenia; Need for prophylactic vaccination and inoculation against influenza; Anemia; Fatigue; CHB (complete heart block); Need for vaccination with 13-polyvalent pneumococcal conjugate vaccine; and Back pain without radiation on her problem list.     is allergic to aspirin; fentanyl; niacin; and penicillins.  We administered epoetin alfa.  SURGICAL HISTORY: Past Surgical History  Procedure Laterality Date  . Thyroidectomy    . Pacemaker placed  2004  . Aortic and mitral valve replacement  2001  . Right breast cyst removed      benign   . Right cataract removed      2005  . Defibrillator implanted 2006    . Cardiac valve replacement    . Cardiac catheterization    .  Insert / replace / remove pacemaker    . Esophagogastroduodenoscopy  06/12/2007    Rourk- normal esophagus, small hiatal hernia, otherwise normal stomach, D1, D2  . Colonoscopy  06/12/2007    Rourk- Normal rectum/Normal colon  . Colonoscopy  12/16/02    small hemorrhoids  . Colonoscopy  06/20/2012     Procedure: COLONOSCOPY;  Surgeon: Daneil Dolin, MD;  Location: [facility];  Service: Endoscopy;  Laterality: N/A;  10:45  . Doppler echocardiography  2012  . Doppler echocardiography  05,06,07,08,09,2011    SOCIAL HISTORY: History   Social History  . Marital Status: Widowed    Spouse Name: N/A  . Number of Children: 0  . Years of Education: N/A   Occupational History  . retired    Social History Main Topics  . Smoking status: Former Smoker -- 0.75 packs/day for 40 years    Types: Cigarettes    Quit date: 03/08/1987  . Smokeless tobacco: Never Used  . Alcohol Use: No  . Drug Use: No  . Sexual Activity: Not on file   Other Topics Concern  . Not on file   Social History Narrative   From [place] (2004)    FAMILY HISTORY: Family History  Problem Relation Age of Onset  . Cancer Mother     behind pancres  . Heart disease Father   . Heart disease Sister     Heart attack  . Heart disease Brother   . Diabetes Brother     Review of Systems  Constitutional: Negative for fever, chills, weight loss and malaise/fatigue.  HENT: Negative for congestion, hearing loss, nosebleeds, sore throat and tinnitus.   Eyes: Negative for blurred vision, double vision, pain and discharge.  Respiratory: Negative for cough, hemoptysis, sputum production, shortness of breath and wheezing.   Cardiovascular: Negative for chest pain, palpitations, claudication, leg swelling and PND.  Gastrointestinal: Negative for heartburn, nausea, vomiting, abdominal pain, diarrhea, constipation, blood in stool and melena.  Genitourinary: Negative for dysuria, urgency, frequency and hematuria.  Musculoskeletal: Negative for myalgias, joint pain and falls.  Skin: Negative for itching and rash.  Neurological: Negative for dizziness, tingling, tremors, sensory change, speech change, focal weakness, seizures, loss of consciousness, weakness and headaches.  Endo/Heme/Allergies: Does not bruise/bleed easily.    Psychiatric/Behavioral: Negative for depression, suicidal ideas, memory loss and substance abuse. The patient is not nervous/anxious and does not have insomnia.     PHYSICAL EXAMINATION  ECOG PERFORMANCE STATUS: 0 - Asymptomatic  Filed Vitals:   11/13/14 1100  BP: 107/56  Pulse: 71  Temp: 97.4 F (36.3 C)  Resp: 18    Physical Exam  Constitutional: She is oriented to person, place, and time and well-developed, well-nourished, and in no distress.  HENT:  Head: Normocephalic and atraumatic.  Nose: Nose normal.  Mouth/Throat: Oropharynx is clear and moist. No oropharyngeal exudate.  Eyes: Conjunctivae and EOM are normal. Pupils are equal, round, and reactive to light. Right eye exhibits no discharge. Left eye exhibits no discharge. No scleral icterus.  Neck: Normal range of motion. Neck supple. No tracheal deviation present. No thyromegaly present.  Cardiovascular: Normal rate and regular rhythm.  Exam reveals no gallop and no friction rub.   No murmur heard. mechanical heart valves noted  Pulmonary/Chest: Effort normal and breath sounds normal. She has no wheezes. She has no rales.  Abdominal: Soft. Bowel sounds are normal. She exhibits no distension and no mass. There is no tenderness. There is no rebound and no guarding.  Musculoskeletal: Normal range of motion. She exhibits no edema.  Lymphadenopathy:    She has no cervical adenopathy.  Neurological: She is alert and oriented to person, place, and time. She has normal reflexes. No cranial nerve deficit. Gait normal. Coordination normal.  Skin: Skin is warm and dry. No rash noted.  Psychiatric: Mood, memory, affect and judgment normal.  Nursing note and vitals reviewed.   LABORATORY DATA:  CBC    Component Value Date/Time   WBC 6.2 11/13/2014 1118   RBC 3.45* 11/13/2014 1118   RBC 3.37* 01/30/2014 1255   HGB 10.6* 11/13/2014 1118   HCT 33.1* 11/13/2014 1118   PLT 227 11/13/2014 1118   MCV 95.9 11/13/2014 1118   MCH  30.7 11/13/2014 1118   MCHC 32.0 11/13/2014 1118   RDW 15.3 11/13/2014 1118   LYMPHSABS 1.1 11/13/2014 1118   MONOABS 0.5 11/13/2014 1118   EOSABS 0.0 11/13/2014 1118   BASOSABS 0.0 11/13/2014 1118   CMP     Component Value Date/Time   NA 140 09/14/2014 1239   K 4.8 09/14/2014 1239   CL 102 09/14/2014 1239   CO2 28 09/14/2014 1239   GLUCOSE 93 09/14/2014 1239   BUN 52* 09/14/2014 1239   CREATININE 2.43* 09/14/2014 1239   CREATININE 2.43* 04/05/2014 1346   CALCIUM 9.4 09/14/2014 1239   CALCIUM 9.5 06/12/2010 0459   PROT 7.3 09/14/2014 1239   ALBUMIN 4.2 09/14/2014 1239   AST 23 09/14/2014 1239   ALT 10 09/14/2014 1239   ALKPHOS 25* 09/14/2014 1239   BILITOT 0.4 09/14/2014 1239   GFRNONAA 18* 09/14/2014 1239   GFRNONAA 18* 04/05/2014 1346   GFRAA 21* 09/14/2014 1239   GFRAA 21* 04/05/2014 1346     ASSESSMENT and THERAPY PLAN:   Iron deficiency anemia  76 -year-old female with anemia felt to be secondary to ongoing mild hemolysis from 2 mechanical heart valves. Looking back over her ferritin levels she certainly does not need any additional iron. Weather or not to continue her on Procrit, I think should be reconsidered and we can address this at follow-up. She seems to be doing well. We will schedule a return appointment in 6 months. Sooner if needed.    All questions were answered. The patient knows to call the clinic with any problems, questions or concerns. We can certainly see the patient much sooner if necessary. This note was electronically signed. Molli Hazard 11/13/2014

## 2014-11-13 NOTE — Progress Notes (Signed)
[name] [name]'s reason for visit today is for an injection and labs as scheduled per MD orders.  Labs were drawn prior to administration of ordered medication.    [name] [name] also received procrit 40,000 units per MD orders; see [facility] for administration details.  [name] [name] tolerated all procedures well and without incident; questions were answered and patient was discharged.

## 2014-11-13 NOTE — Progress Notes (Signed)
LABS FOR CBCD,FERR  

## 2014-11-13 NOTE — Progress Notes (Signed)
Please see office encounter for more information

## 2014-11-13 NOTE — Patient Instructions (Signed)
[place] at [facility] Discharge Instructions  RECOMMENDATIONS MADE BY THE CONSULTANT AND ANY TEST RESULTS WILL BE SENT TO YOUR REFERRING PHYSICIAN.  Exam and discussion by Dr. Whitney Muse.  Your labs are stable and we will continue with lab work  and your procrit every 3 weeks.  Will check your iron levels every 9 weeks. Report increased fatigue or shortness of breath.  Thank you for choosing [place] at [facility] to provide your oncology and hematology care.  To afford each patient quality time with our provider, please arrive at least 15 minutes before your scheduled appointment time.    You need to re-schedule your appointment should you arrive 10 or more minutes late.  We strive to give you quality time with our providers, and arriving late affects you and other patients whose appointments are after yours.  Also, if you no show three or more times for appointments you may be dismissed from the clinic at the providers discretion.     Again, thank you for choosing [facility].  Our hope is that these requests will decrease the amount of time that you wait before being seen by our physicians.       _____________________________________________________________  Should you have questions after your visit to [facility], please contact our office at [phone] between the hours of 8:30 a.m. and 4:30 p.m.  Voicemails left after 4:30 p.m. will not be returned until the following business day.  For prescription refill requests, have your pharmacy contact our office.

## 2014-11-13 NOTE — Progress Notes (Signed)
ICD check in office with ICM. 

## 2014-11-14 IMAGING — CR DG CHEST 2V
2 series · 2 of 2 positions shown · non-contrast
Comparison: DG CHEST 2 VIEW dated 12/04/2013; DG CHEST 2 VIEW dated
06/08/2010

CLINICAL DATA: SHORTNESS OF BREATH

EXAM:
CHEST  2 VIEW

[view not recorded (1 of 2)]
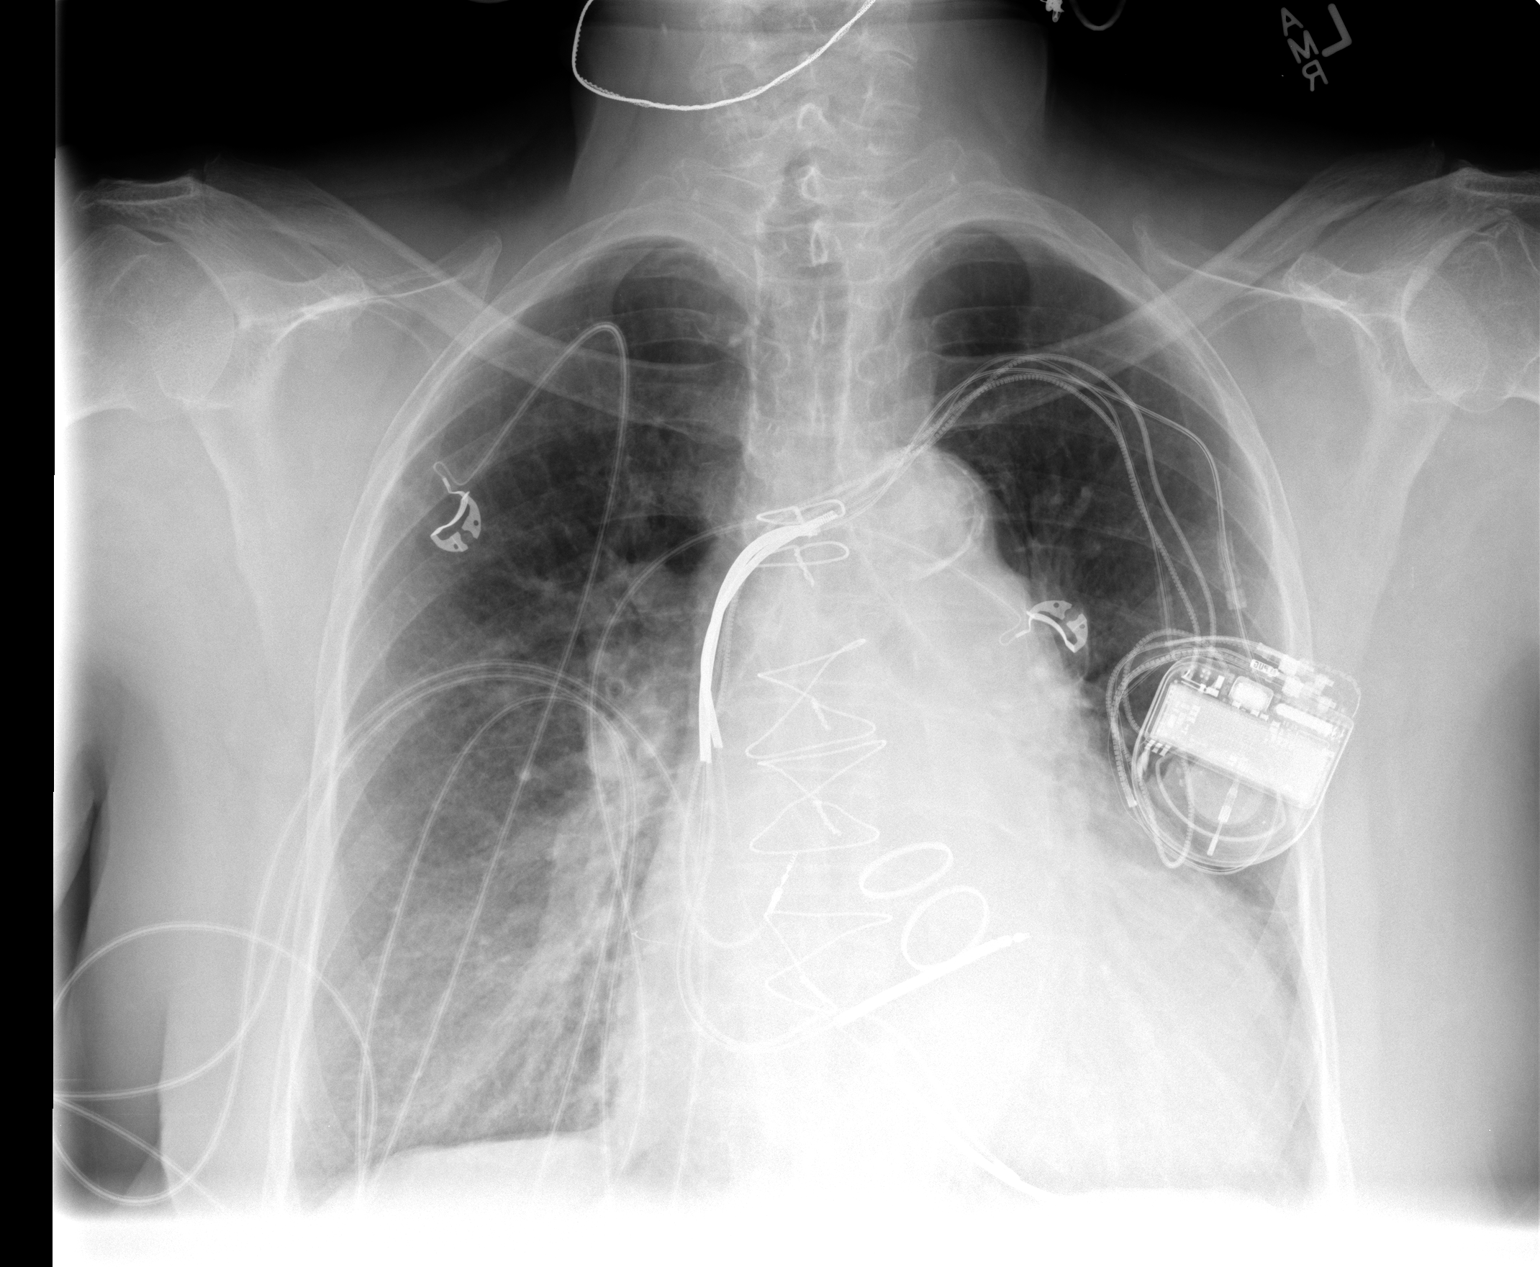

[view not recorded (2 of 2)]
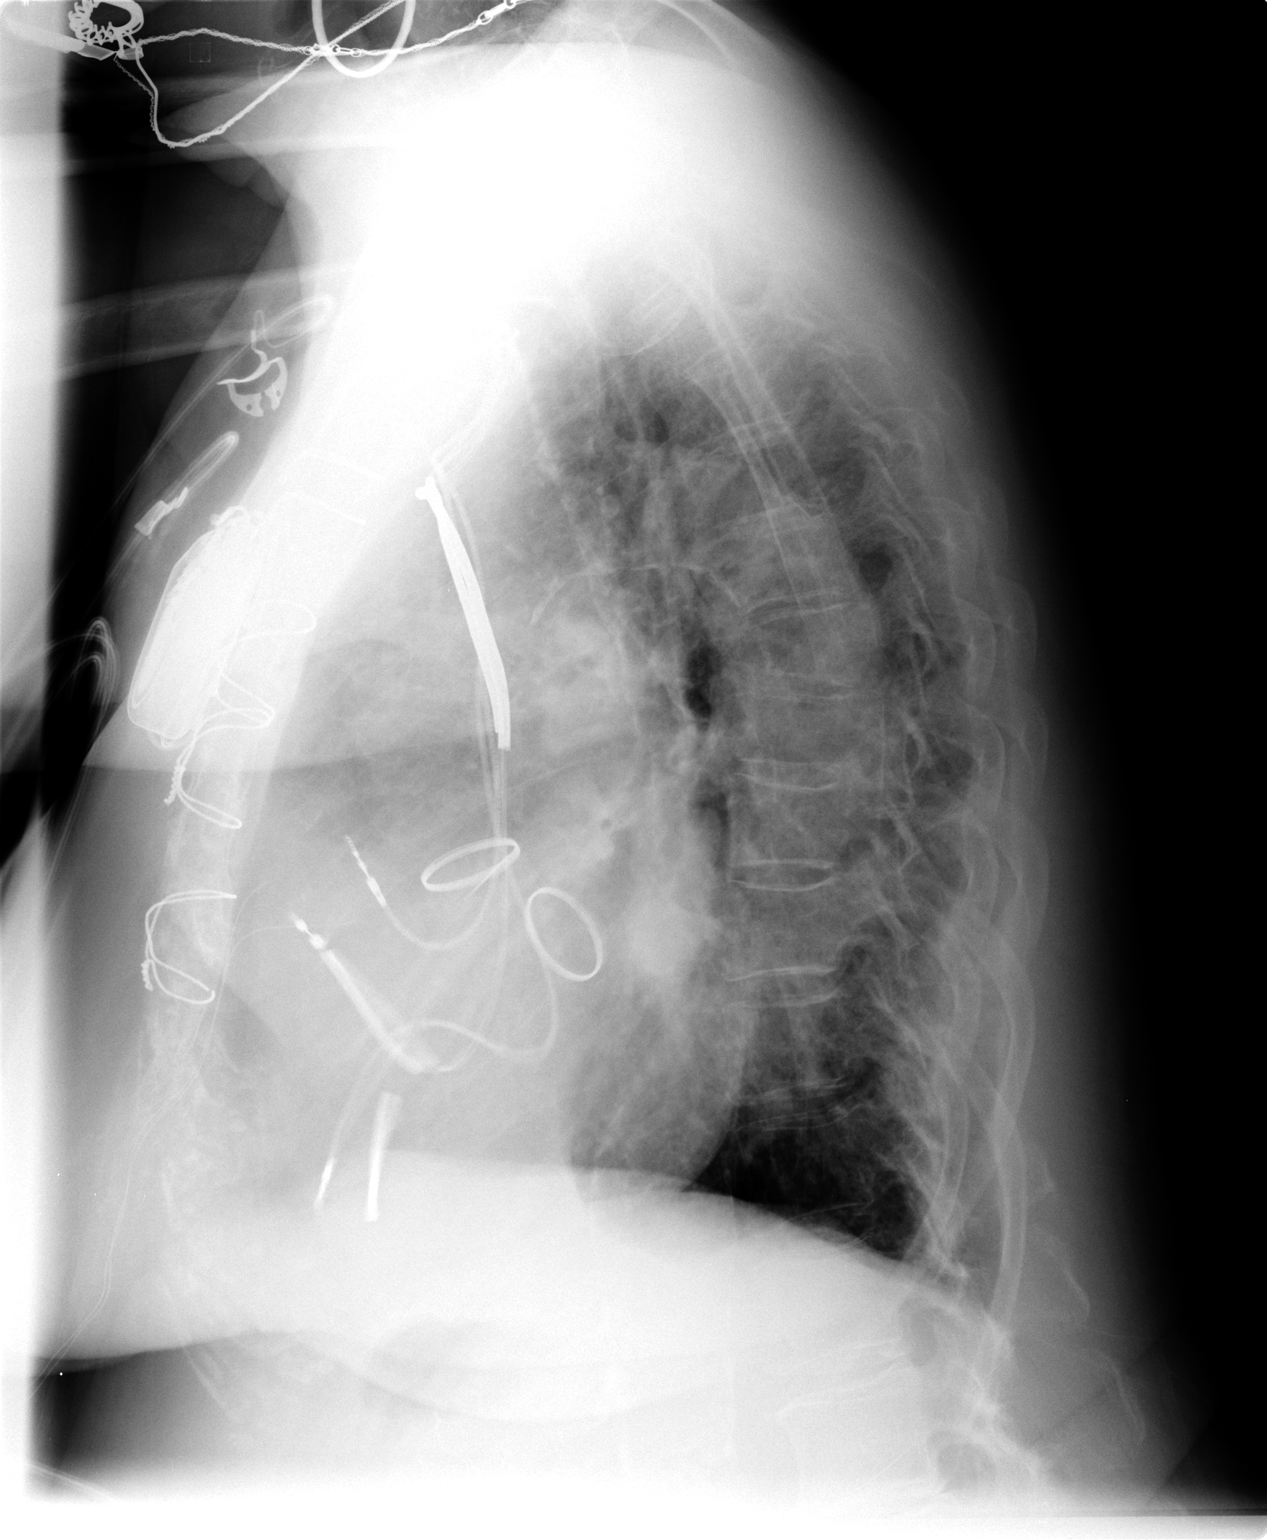

[2 of 2 positions shown; findings below may reference images not displayed]

FINDINGS: There is flattening of the hemidiaphragms. Cardiac silhouette is
enlarged. Patient is status post median sternotomy and mitral and
aortic valve replacement. Atherosclerotic calcifications are
appreciated within the aorta A left chest wall dual chamber AICD.
There is mild prominence of the interstitial markings and areas of
mild peribronchial cuffing have developed. No focal regions of
consolidation or focal infiltrates. Stable chronic compression
deformity within the mid thoracic spine.
IMPRESSION: COPD

Mild pulmonary edema

No focal regions of consolidation or focal infiltrates.

Stable cardiomegaly

## 2014-11-19 ENCOUNTER — Encounter (HOSPITAL_COMMUNITY): Payer: Medicare Other

## 2014-11-19 ENCOUNTER — Other Ambulatory Visit: Payer: Self-pay | Admitting: Family Medicine

## 2014-11-20 ENCOUNTER — Telehealth: Payer: Self-pay

## 2014-11-20 ENCOUNTER — Ambulatory Visit (INDEPENDENT_AMBULATORY_CARE_PROVIDER_SITE_OTHER): Payer: Medicare Other | Admitting: *Deleted

## 2014-11-20 ENCOUNTER — Telehealth: Payer: Self-pay | Admitting: Cardiology

## 2014-11-20 DIAGNOSIS — I255 Ischemic cardiomyopathy: Secondary | ICD-10-CM

## 2014-11-20 LAB — MDC_IDC_ENUM_SESS_TYPE_INCLINIC

## 2014-11-20 NOTE — Telephone Encounter (Signed)
Melton Alar will handle

## 2014-11-20 NOTE — Progress Notes (Signed)
ICD check in clinic for alert tone heard. Battery reached RRT 11-20-14. Alert tones turned off for low battery. ROV 12-18-14 @ 1145 with GT/RDS.

## 2014-11-20 NOTE — Telephone Encounter (Signed)
Pt stated she heard her pacemaker alert go off twice this morning. Pt agreed to appt in [place] office today at 1:30.

## 2014-11-20 NOTE — Telephone Encounter (Signed)
Pt states ICD "made one long noise" twice this morning.States she was told to call EP immediately if she ever hear that sound.She will call [place] office now and I Janan Halter RN

## 2014-11-20 NOTE — Telephone Encounter (Signed)
Spoke with patient and she will be seen in the [place] office @ 1:30pm today.

## 2014-12-01 ENCOUNTER — Encounter: Payer: Self-pay | Admitting: Internal Medicine

## 2014-12-01 ENCOUNTER — Other Ambulatory Visit: Payer: Self-pay | Admitting: Family Medicine

## 2014-12-04 ENCOUNTER — Encounter (HOSPITAL_COMMUNITY): Payer: PRIVATE HEALTH INSURANCE | Attending: Hematology & Oncology

## 2014-12-04 ENCOUNTER — Encounter (HOSPITAL_COMMUNITY): Payer: Medicare Other | Attending: Internal Medicine

## 2014-12-04 DIAGNOSIS — D509 Iron deficiency anemia, unspecified: Secondary | ICD-10-CM

## 2014-12-04 DIAGNOSIS — D638 Anemia in other chronic diseases classified elsewhere: Secondary | ICD-10-CM | POA: Insufficient documentation

## 2014-12-04 DIAGNOSIS — E611 Iron deficiency: Secondary | ICD-10-CM | POA: Insufficient documentation

## 2014-12-04 DIAGNOSIS — N184 Chronic kidney disease, stage 4 (severe): Secondary | ICD-10-CM

## 2014-12-04 LAB — CBC WITH DIFFERENTIAL/PLATELET
Basophils Absolute: 0 10*3/uL (ref 0.0–0.1)
Basophils Relative: 0 % (ref 0–1)
Eosinophils Absolute: 0 10*3/uL (ref 0.0–0.7)
Eosinophils Relative: 0 % (ref 0–5)
HCT: 36 % (ref 36.0–46.0)
Hemoglobin: 11.5 g/dL — ABNORMAL LOW (ref 12.0–15.0)
Lymphocytes Relative: 22 % (ref 12–46)
Lymphs Abs: 1.3 10*3/uL (ref 0.7–4.0)
MCH: 31 pg (ref 26.0–34.0)
MCHC: 31.9 g/dL (ref 30.0–36.0)
MCV: 97 fL (ref 78.0–100.0)
Monocytes Absolute: 0.6 10*3/uL (ref 0.1–1.0)
Monocytes Relative: 11 % (ref 3–12)
Neutro Abs: 3.9 10*3/uL (ref 1.7–7.7)
Neutrophils Relative %: 67 % (ref 43–77)
Platelets: 178 10*3/uL (ref 150–400)
RBC: 3.71 MIL/uL — ABNORMAL LOW (ref 3.87–5.11)
RDW: 15.3 % (ref 11.5–15.5)
WBC: 5.8 10*3/uL (ref 4.0–10.5)

## 2014-12-04 NOTE — Progress Notes (Signed)
Hemoglobin 11.5 today. Procrit not given, treatment parameters not met.

## 2014-12-04 NOTE — Progress Notes (Signed)
Lab draw

## 2014-12-07 ENCOUNTER — Encounter (HOSPITAL_COMMUNITY): Payer: Self-pay | Admitting: Hematology & Oncology

## 2014-12-07 ENCOUNTER — Ambulatory Visit (INDEPENDENT_AMBULATORY_CARE_PROVIDER_SITE_OTHER): Payer: Medicare Other | Admitting: *Deleted

## 2014-12-07 DIAGNOSIS — Z7901 Long term (current) use of anticoagulants: Secondary | ICD-10-CM | POA: Diagnosis not present

## 2014-12-07 DIAGNOSIS — Z5181 Encounter for therapeutic drug level monitoring: Secondary | ICD-10-CM | POA: Diagnosis not present

## 2014-12-07 DIAGNOSIS — Z954 Presence of other heart-valve replacement: Secondary | ICD-10-CM

## 2014-12-07 DIAGNOSIS — Z952 Presence of prosthetic heart valve: Secondary | ICD-10-CM

## 2014-12-07 LAB — POCT INR: INR: 4.5

## 2014-12-16 ENCOUNTER — Other Ambulatory Visit (HOSPITAL_COMMUNITY): Payer: Self-pay

## 2014-12-18 ENCOUNTER — Ambulatory Visit (INDEPENDENT_AMBULATORY_CARE_PROVIDER_SITE_OTHER): Payer: Medicare Other | Admitting: Internal Medicine

## 2014-12-18 ENCOUNTER — Encounter: Payer: Self-pay | Admitting: Internal Medicine

## 2014-12-18 ENCOUNTER — Encounter: Payer: Self-pay | Admitting: *Deleted

## 2014-12-18 VITALS — BP 120/62 | HR 74 | Ht 67.0 in | Wt 228.0 lb

## 2014-12-18 DIAGNOSIS — Z7901 Long term (current) use of anticoagulants: Secondary | ICD-10-CM

## 2014-12-18 DIAGNOSIS — I10 Essential (primary) hypertension: Secondary | ICD-10-CM | POA: Diagnosis not present

## 2014-12-18 DIAGNOSIS — Z01818 Encounter for other preprocedural examination: Secondary | ICD-10-CM

## 2014-12-18 DIAGNOSIS — I255 Ischemic cardiomyopathy: Secondary | ICD-10-CM

## 2014-12-18 DIAGNOSIS — I5022 Chronic systolic (congestive) heart failure: Secondary | ICD-10-CM

## 2014-12-18 DIAGNOSIS — Z95810 Presence of automatic (implantable) cardiac defibrillator: Secondary | ICD-10-CM

## 2014-12-18 NOTE — Assessment & Plan Note (Signed)
Her blood pressure is well controlled. No change in her meds. 

## 2014-12-18 NOTE — Assessment & Plan Note (Signed)
Will hold her coumadin one day before her procedure.

## 2014-12-18 NOTE — Progress Notes (Signed)
HPI [name] returns today for followup. She has a h/o complete heart block and a non-ischemic CM, chronic class 3 systolic heart failure and pacing induced left BBB. She has reached ERI on her DDD ICD. She is pacing 99% of the time. No other complaints except for fatigue.  Allergies  Allergen Reactions  . Aspirin     Chronic coumadin  . Fentanyl Dermatitis    Rash with patch   . Niacin Itching  . Penicillins Other (See Comments)    Bruises      Current Outpatient Prescriptions  Medication Sig Dispense Refill  . acetaminophen (TYLENOL) 325 MG tablet Take 325-650 mg by mouth every 6 (six) hours as needed for mild pain, moderate pain or headache.    . albuterol (PROVENTIL HFA;VENTOLIN HFA) 108 (90 BASE) MCG/ACT inhaler Inhale 2 puffs into the lungs every 6 (six) hours as needed for wheezing or shortness of breath. 1 Inhaler 3  . albuterol (PROVENTIL) (2.5 MG/3ML) 0.083% nebulizer solution INHALE 1 VIAL VIA NEBULIZER THREE TIMES DAILY. 300 mL 3  . allopurinol (ZYLOPRIM) 100 MG tablet take 1 tablet by mouth once daily 90 tablet 1  . Choline Fenofibrate (TRILIPIX) 135 MG capsule Take 1 capsule (135 mg total) by mouth daily. 90 capsule 1  . clindamycin (CLEOCIN) 150 MG capsule Take 1 capsule by mouth 4 (four) times daily.  0  . Fluticasone-Salmeterol (ADVAIR DISKUS) 100-50 MCG/DOSE AEPB inhale 1 dose by mouth twice a day 60 each 4  . folic acid (FOLVITE) 1 MG tablet take 1 tablet by mouth once daily 100 tablet 6  . furosemide (LASIX) 40 MG tablet Take 1 tablet (40 mg total) by mouth daily. 90 tablet 1  . gabapentin (NEURONTIN) 300 MG capsule Take 1 capsule (300 mg total) by mouth at bedtime. 90 capsule 1  . hydrALAZINE (APRESOLINE) 25 MG tablet Take 1 tablet (25 mg total) by mouth 2 (two) times daily. 270 tablet 3  . HYDROcodone-acetaminophen (NORCO) 7.5-325 MG per tablet One tablet once daily , as needed, for uncontrolled pain 30 tablet 0  . isosorbide dinitrate (ISOCHRON) 40 MG  CR tablet Take 1/2 tablet (20mg  total) twice a day. 45 tablet 3  . KLOR-CON M20 20 MEQ tablet Take 1 tablet by mouth  daily 90 tablet 1  . LANOXIN 62.5 MCG TABS Take 1 tablet by mouth  every day 90 tablet 0  . Liniments (SALONPAS) PADS Apply 1 each topically as needed.    Marland Kitchen losartan (COZAAR) 50 MG tablet Take 1 tablet (50 mg total) by mouth daily. 90 tablet 1  . meclizine (ANTIVERT) 12.5 MG tablet Take 1 tablet (12.5 mg total) by mouth 2 (two) times daily as needed for dizziness. 30 tablet 2  . methocarbamol (ROBAXIN) 500 MG tablet Take 1 tablet (500 mg total) by mouth 3 (three) times daily. 30 tablet 0  . metolazone (ZAROXOLYN) 2.5 MG tablet Take 2.5 mg by mouth once a week. Uses on Wednesday    . metoprolol succinate (TOPROL-XL) 50 MG 24 hr tablet Take 100 mg by mouth daily. Take with or immediately following a meal.    . Multiple Vitamins-Minerals (CENTRUM SILVER ADULT 50+) TABS Take 1 tablet by mouth once a week.    . ondansetron (ZOFRAN) 4 MG tablet Take 4 mg by mouth daily as needed for nausea.    . polyethylene glycol powder (MIRALAX) powder Take 17 g by mouth daily as needed for mild constipation.     Marland Kitchen  SYNTHROID 75 MCG tablet Take 1 tablet by mouth  daily 90 tablet 0  . temazepam (RESTORIL) 30 MG capsule take 1 capsule at bedtime if needed 30 capsule 2  . VYTORIN 10-20 MG per tablet Take 1 tablet by mouth 3  times weekly 39 tablet 0  . warfarin (COUMADIN) 5 MG tablet Take as directed by coumadin clinic 10 tablet 0   No current facility-administered medications for this visit.   Facility-Administered Medications Ordered in Other Visits  Medication Dose Route Frequency Provider Last Rate Last Dose  . epoetin alfa (EPOGEN,PROCRIT) injection 40,000 Units  40,000 Units Subcutaneous Once Farrel Gobble, MD         Past Medical History  Diagnosis Date  . DJD (degenerative joint disease) of lumbar spine   . Chronic kidney disease, stage I   . Esophageal reflux   . Other and  unspecified hyperlipidemia   . Other primary cardiomyopathies   . Obesity, unspecified   . Unspecified hypothyroidism   . DM (diabetes mellitus)   . Essential hypertension, benign   . 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 Pain in joint, pelvic region and thigh   . Insomnia, unspecified   . Iron deficiency 10/04/2011  . IDA (iron deficiency anemia)     Parenteral iron/[name]  . CHF (congestive heart failure)   . COPD (chronic obstructive pulmonary disease)   . Heart murmur   . ICD (implantable cardiac defibrillator) in place   . Adenomatous polyp of colon 12/16/2002    Dr. [name]/[facility]  . Hyperlipemia   . Hemolysis 09/24/2012  . Cancer   . Valvular disease   . Thyroid cancer     remote thyroidectomy, no recurrence  . CHB (complete heart block) 10/07/2014    ROS:   All systems reviewed and negative except as noted in the HPI.   Past Surgical History  Procedure Laterality Date  . Thyroidectomy    . Pacemaker placed  2004  . Aortic and mitral valve replacement  2001  . Right breast cyst removed      benign   . Right cataract removed      2005  . Defibrillator implanted 2006    . Cardiac valve replacement    . Cardiac catheterization    . Insert / replace / remove pacemaker    . Esophagogastroduodenoscopy  06/12/2007    Rourk- normal esophagus, small hiatal hernia, otherwise normal stomach, D1, D2  . Colonoscopy  06/12/2007    Rourk- Normal rectum/Normal colon  . Colonoscopy  12/16/02    small hemorrhoids  . Colonoscopy  06/20/2012    Procedure: COLONOSCOPY;  Surgeon: Daneil Dolin, MD;  [facility];  Service: Endoscopy;  Laterality: N/A;  10:45  . Doppler echocardiography  2012  . Doppler echocardiography  05,06,07,08,09,2011     Family History  Problem Relation Age of Onset  . Cancer Mother     behind pancres  . Heart disease Father   . Heart disease Sister     Heart attack  .  Heart disease Brother   . Diabetes Brother      History   Social History  . Marital Status: Widowed    Spouse Name: N/A  . Number of Children: 0  . Years of Education: N/A   Occupational History  . retired    Social History Main Topics  . Smoking status: Former Smoker -- 0.75 packs/day for 40 years  Types: Cigarettes    Quit date: 03/08/1987  . Smokeless tobacco: Never Used  . Alcohol Use: No  . Drug Use: No  . Sexual Activity: Not on file   Other Topics Concern  . Not on file   Social History Narrative   From [place] (2004)     BP 120/62 mmHg  Pulse 74  Ht 5\' 7"  (1.702 m)  Wt 228 lb (103.42 kg)  BMI 35.70 kg/m2  SpO2 92%  Physical Exam:  Well appearing 81 yo woman, NAD HEENT: Unremarkable Neck:  8 cm JVD, no thyromegally Back:  No CVA tenderness Lungs:  Clear with no wheezes HEART:  Regular rate rhythm, no murmurs, no rubs, no clicks Abd:  soft, positive bowel sounds, no organomegally, no rebound, no guarding Ext:  2 plus pulses, no edema, no cyanosis, no clubbing Skin:  No rashes no nodules Neuro:  CN II through XII intact, motor grossly intact   DEVICE  Normal device function.  See PaceArt for details.   Assess/Plan:

## 2014-12-18 NOTE — Patient Instructions (Signed)
Your physician recommends that you schedule a follow-up appointment in:  After Change out  Your physician recommends that you continue on your current medications as directed. Please refer to the Current Medication list given to you today.  Your physician has recommended that you have a pacemaker inserted. A pacemaker is a small device that is placed under the skin of your chest or abdomen to help control abnormal heart rhythms. This device uses electrical pulses to prompt the heart to beat at a normal rate. Pacemakers are used to treat heart rhythms that are too slow. Wire (leads) are attached to the pacemaker that goes into the chambers of you heart. This is done in the hospital and usually requires and overnight stay. Please see the instruction sheet given to you today for more information.  Your physician recommends that you return for lab work Done on 01-15-15   Thank you for choosing [facility]!

## 2014-12-18 NOTE — Assessment & Plan Note (Signed)
She is at [facility]. Will schedule generator change and upgrade to a biv ICD

## 2014-12-18 NOTE — Assessment & Plan Note (Signed)
She has class 3 symptoms and pacing induced LBBB. I have scheduled her to undergo upgrade to a BiV ICD. We will schedule in the next few weeks after she has undergone removal of her infected tooth. She will need a venogram prior to her procedure to confirm the her vein is patient.

## 2014-12-21 ENCOUNTER — Other Ambulatory Visit: Payer: Self-pay | Admitting: Family Medicine

## 2014-12-21 ENCOUNTER — Ambulatory Visit (INDEPENDENT_AMBULATORY_CARE_PROVIDER_SITE_OTHER): Payer: Medicare Other | Admitting: *Deleted

## 2014-12-21 DIAGNOSIS — Z5181 Encounter for therapeutic drug level monitoring: Secondary | ICD-10-CM | POA: Diagnosis not present

## 2014-12-21 DIAGNOSIS — Z7901 Long term (current) use of anticoagulants: Secondary | ICD-10-CM

## 2014-12-21 DIAGNOSIS — Z954 Presence of other heart-valve replacement: Secondary | ICD-10-CM

## 2014-12-21 DIAGNOSIS — Z952 Presence of prosthetic heart valve: Secondary | ICD-10-CM

## 2014-12-21 LAB — POCT INR: INR: 3

## 2014-12-25 ENCOUNTER — Encounter (HOSPITAL_COMMUNITY): Payer: Medicare Other | Attending: Internal Medicine

## 2014-12-25 ENCOUNTER — Encounter (HOSPITAL_COMMUNITY): Payer: Medicare Other | Attending: Hematology & Oncology

## 2014-12-25 DIAGNOSIS — E611 Iron deficiency: Secondary | ICD-10-CM | POA: Insufficient documentation

## 2014-12-25 DIAGNOSIS — D638 Anemia in other chronic diseases classified elsewhere: Secondary | ICD-10-CM | POA: Insufficient documentation

## 2014-12-29 ENCOUNTER — Encounter (HOSPITAL_BASED_OUTPATIENT_CLINIC_OR_DEPARTMENT_OTHER): Payer: Medicare Other

## 2014-12-29 ENCOUNTER — Encounter (HOSPITAL_COMMUNITY): Payer: Medicare Other

## 2014-12-29 DIAGNOSIS — D631 Anemia in chronic kidney disease: Secondary | ICD-10-CM | POA: Diagnosis not present

## 2014-12-29 DIAGNOSIS — E611 Iron deficiency: Secondary | ICD-10-CM

## 2014-12-29 DIAGNOSIS — N184 Chronic kidney disease, stage 4 (severe): Secondary | ICD-10-CM

## 2014-12-29 DIAGNOSIS — D638 Anemia in other chronic diseases classified elsewhere: Secondary | ICD-10-CM | POA: Diagnosis not present

## 2014-12-29 LAB — CBC WITH DIFFERENTIAL/PLATELET
Basophils Absolute: 0 10*3/uL (ref 0.0–0.1)
Basophils Relative: 0 % (ref 0–1)
Eosinophils Absolute: 0 10*3/uL (ref 0.0–0.7)
Eosinophils Relative: 0 % (ref 0–5)
HCT: 34.8 % — ABNORMAL LOW (ref 36.0–46.0)
Hemoglobin: 11.3 g/dL — ABNORMAL LOW (ref 12.0–15.0)
Lymphocytes Relative: 17 % (ref 12–46)
Lymphs Abs: 1.2 10*3/uL (ref 0.7–4.0)
MCH: 30.9 pg (ref 26.0–34.0)
MCHC: 32.5 g/dL (ref 30.0–36.0)
MCV: 95.1 fL (ref 78.0–100.0)
Monocytes Absolute: 0.7 10*3/uL (ref 0.1–1.0)
Monocytes Relative: 9 % (ref 3–12)
Neutro Abs: 5.3 10*3/uL (ref 1.7–7.7)
Neutrophils Relative %: 74 % (ref 43–77)
Platelets: 224 10*3/uL (ref 150–400)
RBC: 3.66 MIL/uL — ABNORMAL LOW (ref 3.87–5.11)
RDW: 15.2 % (ref 11.5–15.5)
WBC: 7.2 10*3/uL (ref 4.0–10.5)

## 2014-12-29 NOTE — Progress Notes (Signed)
Hemoglobin 11.3. Procrit not needed,treatment parameters not met. Discussed treatment/frequency with Dr.Penland. Patient is to come back in 6 weeks for lab +/- Procrit. Discussed treatment changes with patient. Instruct patient that if she feels weak,tired,SOB,etc, she is to call clinic for prn lab work. Patient verbalizes understanding.

## 2014-12-29 NOTE — Progress Notes (Signed)
LABS DRAWN

## 2014-12-29 NOTE — Addendum Note (Signed)
Addended by: Berneta Levins on: 12/29/2014 02:43 PM   Modules accepted: Orders

## 2014-12-29 NOTE — Patient Instructions (Signed)
[place] at [facility] Discharge Instructions  RECOMMENDATIONS MADE BY THE CONSULTANT AND ANY TEST RESULTS WILL BE SENT TO YOUR REFERRING PHYSICIAN.  Hemoglobin 11.3 today, procrit not needed. We will change current treatment plan. Return to clinic in 6 weeks for lab work and procrit injection if needed. Call clinic if you are feeling poorly and think lab work should be done before May 17, we will get you in sooner.  Thank you for choosing [place] at [facility] to provide your oncology and hematology care.  To afford each patient quality time with our provider, please arrive at least 15 minutes before your scheduled appointment time.    You need to re-schedule your appointment should you arrive 10 or more minutes late.  We strive to give you quality time with our providers, and arriving late affects you and other patients whose appointments are after yours.  Also, if you no show three or more times for appointments you may be dismissed from the clinic at the providers discretion.     Again, thank you for choosing [facility].  Our hope is that these requests will decrease the amount of time that you wait before being seen by our physicians.       _____________________________________________________________  Should you have questions after your visit to [facility], please contact our office at [phone] between the hours of 8:30 a.m. and 4:30 p.m.  Voicemails left after 4:30 p.m. will not be returned until the following business day.  For prescription refill requests, have your pharmacy contact our office.

## 2014-12-30 LAB — FERRITIN: Ferritin: 1812 ng/mL — ABNORMAL HIGH (ref 10–291)

## 2015-01-11 ENCOUNTER — Other Ambulatory Visit: Payer: Self-pay | Admitting: Family Medicine

## 2015-01-11 DIAGNOSIS — L609 Nail disorder, unspecified: Secondary | ICD-10-CM | POA: Diagnosis not present

## 2015-01-11 DIAGNOSIS — M79671 Pain in right foot: Secondary | ICD-10-CM | POA: Diagnosis not present

## 2015-01-11 DIAGNOSIS — B351 Tinea unguium: Secondary | ICD-10-CM | POA: Diagnosis not present

## 2015-01-11 DIAGNOSIS — L851 Acquired keratosis [keratoderma] palmaris et plantaris: Secondary | ICD-10-CM | POA: Diagnosis not present

## 2015-01-11 DIAGNOSIS — E1342 Other specified diabetes mellitus with diabetic polyneuropathy: Secondary | ICD-10-CM | POA: Diagnosis not present

## 2015-01-13 ENCOUNTER — Ambulatory Visit (HOSPITAL_COMMUNITY): Payer: PRIVATE HEALTH INSURANCE

## 2015-01-13 ENCOUNTER — Other Ambulatory Visit (HOSPITAL_COMMUNITY): Payer: PRIVATE HEALTH INSURANCE

## 2015-01-14 ENCOUNTER — Encounter: Payer: Self-pay | Admitting: Internal Medicine

## 2015-01-15 DIAGNOSIS — Z7901 Long term (current) use of anticoagulants: Secondary | ICD-10-CM | POA: Diagnosis not present

## 2015-01-15 DIAGNOSIS — I5022 Chronic systolic (congestive) heart failure: Secondary | ICD-10-CM | POA: Diagnosis not present

## 2015-01-15 DIAGNOSIS — I10 Essential (primary) hypertension: Secondary | ICD-10-CM | POA: Diagnosis not present

## 2015-01-15 DIAGNOSIS — Z01818 Encounter for other preprocedural examination: Secondary | ICD-10-CM | POA: Diagnosis not present

## 2015-01-15 DIAGNOSIS — Z95810 Presence of automatic (implantable) cardiac defibrillator: Secondary | ICD-10-CM | POA: Diagnosis not present

## 2015-01-16 LAB — CBC WITH DIFFERENTIAL/PLATELET
Basophils Absolute: 0 10*3/uL (ref 0.0–0.1)
Basophils Relative: 0 % (ref 0–1)
Eosinophils Absolute: 0 10*3/uL (ref 0.0–0.7)
Eosinophils Relative: 0 % (ref 0–5)
HCT: 31.6 % — ABNORMAL LOW (ref 36.0–46.0)
Hemoglobin: 10.5 g/dL — ABNORMAL LOW (ref 12.0–15.0)
Lymphocytes Relative: 23 % (ref 12–46)
Lymphs Abs: 1.3 10*3/uL (ref 0.7–4.0)
MCH: 30.4 pg (ref 26.0–34.0)
MCHC: 33.2 g/dL (ref 30.0–36.0)
MCV: 91.6 fL (ref 78.0–100.0)
MPV: 10.9 fL (ref 8.6–12.4)
Monocytes Absolute: 0.4 10*3/uL (ref 0.1–1.0)
Monocytes Relative: 8 % (ref 3–12)
Neutro Abs: 3.8 10*3/uL (ref 1.7–7.7)
Neutrophils Relative %: 69 % (ref 43–77)
Platelets: 234 10*3/uL (ref 150–400)
RBC: 3.45 MIL/uL — ABNORMAL LOW (ref 3.87–5.11)
RDW: 15.7 % — ABNORMAL HIGH (ref 11.5–15.5)
WBC: 5.5 10*3/uL (ref 4.0–10.5)

## 2015-01-16 LAB — BASIC METABOLIC PANEL
BUN: 54 mg/dL — ABNORMAL HIGH (ref 6–23)
CO2: 29 mEq/L (ref 19–32)
Calcium: 9.8 mg/dL (ref 8.4–10.5)
Chloride: 101 mEq/L (ref 96–112)
Creat: 2.17 mg/dL — ABNORMAL HIGH (ref 0.50–1.10)
Glucose, Bld: 87 mg/dL (ref 70–99)
Potassium: 5 mEq/L (ref 3.5–5.3)
Sodium: 141 mEq/L (ref 135–145)

## 2015-01-16 LAB — PROTIME-INR
INR: 3.86 — ABNORMAL HIGH (ref <1.50)
Prothrombin Time: 37.9 seconds — ABNORMAL HIGH (ref 11.6–15.2)

## 2015-01-16 LAB — APTT: aPTT: 57 seconds — ABNORMAL HIGH (ref 24–37)

## 2015-01-17 DIAGNOSIS — Z952 Presence of prosthetic heart valve: Secondary | ICD-10-CM | POA: Diagnosis not present

## 2015-01-17 DIAGNOSIS — J449 Chronic obstructive pulmonary disease, unspecified: Secondary | ICD-10-CM | POA: Diagnosis not present

## 2015-01-17 DIAGNOSIS — M1990 Unspecified osteoarthritis, unspecified site: Secondary | ICD-10-CM | POA: Diagnosis not present

## 2015-01-17 DIAGNOSIS — E669 Obesity, unspecified: Secondary | ICD-10-CM | POA: Diagnosis not present

## 2015-01-17 DIAGNOSIS — K219 Gastro-esophageal reflux disease without esophagitis: Secondary | ICD-10-CM | POA: Diagnosis not present

## 2015-01-17 DIAGNOSIS — Z85850 Personal history of malignant neoplasm of thyroid: Secondary | ICD-10-CM | POA: Diagnosis not present

## 2015-01-17 DIAGNOSIS — E039 Hypothyroidism, unspecified: Secondary | ICD-10-CM | POA: Diagnosis not present

## 2015-01-17 DIAGNOSIS — I5022 Chronic systolic (congestive) heart failure: Secondary | ICD-10-CM | POA: Diagnosis not present

## 2015-01-17 DIAGNOSIS — Z6836 Body mass index (BMI) 36.0-36.9, adult: Secondary | ICD-10-CM | POA: Diagnosis not present

## 2015-01-17 DIAGNOSIS — E119 Type 2 diabetes mellitus without complications: Secondary | ICD-10-CM | POA: Diagnosis not present

## 2015-01-17 DIAGNOSIS — Z95810 Presence of automatic (implantable) cardiac defibrillator: Secondary | ICD-10-CM | POA: Diagnosis not present

## 2015-01-17 DIAGNOSIS — I82B12 Acute embolism and thrombosis of left subclavian vein: Secondary | ICD-10-CM | POA: Diagnosis not present

## 2015-01-17 DIAGNOSIS — Z7901 Long term (current) use of anticoagulants: Secondary | ICD-10-CM | POA: Diagnosis not present

## 2015-01-17 DIAGNOSIS — E785 Hyperlipidemia, unspecified: Secondary | ICD-10-CM | POA: Diagnosis not present

## 2015-01-17 DIAGNOSIS — Z87891 Personal history of nicotine dependence: Secondary | ICD-10-CM | POA: Diagnosis not present

## 2015-01-17 DIAGNOSIS — Z86010 Personal history of colonic polyps: Secondary | ICD-10-CM | POA: Diagnosis not present

## 2015-01-17 DIAGNOSIS — Z9861 Coronary angioplasty status: Secondary | ICD-10-CM | POA: Diagnosis not present

## 2015-01-17 DIAGNOSIS — Z880 Allergy status to penicillin: Secondary | ICD-10-CM | POA: Diagnosis not present

## 2015-01-17 DIAGNOSIS — Z888 Allergy status to other drugs, medicaments and biological substances status: Secondary | ICD-10-CM | POA: Diagnosis not present

## 2015-01-17 DIAGNOSIS — I442 Atrioventricular block, complete: Secondary | ICD-10-CM | POA: Diagnosis not present

## 2015-01-17 DIAGNOSIS — L309 Dermatitis, unspecified: Secondary | ICD-10-CM | POA: Diagnosis not present

## 2015-01-17 MED ORDER — SODIUM CHLORIDE 0.9 % IV SOLN
1500.0000 mg | INTRAVENOUS | Status: AC
  Filled 2015-01-17 (×2): qty 1500

## 2015-01-17 MED ORDER — SODIUM CHLORIDE 0.9 % IR SOLN
80.0000 mg | Status: AC
  Filled 2015-01-17 (×2): qty 2

## 2015-01-17 MED ORDER — SODIUM CHLORIDE 0.9 % IV SOLN
INTRAVENOUS | Status: DC
  Administered 2015-01-18: 11:00:00 via INTRAVENOUS

## 2015-01-18 ENCOUNTER — Ambulatory Visit (HOSPITAL_COMMUNITY)
Admission: RE | Admit: 2015-01-18 | Discharge: 2015-01-18 | Disposition: A | Discharge status: Home / Self Care | Payer: Medicare Other | Source: Ambulatory Visit | Attending: Internal Medicine | Admitting: Internal Medicine

## 2015-01-18 ENCOUNTER — Other Ambulatory Visit: Payer: Self-pay

## 2015-01-18 ENCOUNTER — Encounter (HOSPITAL_COMMUNITY): Payer: Self-pay | Admitting: Internal Medicine

## 2015-01-18 ENCOUNTER — Encounter (HOSPITAL_COMMUNITY)
Admission: RE | Disposition: A | Discharge status: Home / Self Care | Payer: Self-pay | Source: Ambulatory Visit | Attending: Internal Medicine

## 2015-01-18 DIAGNOSIS — I5022 Chronic systolic (congestive) heart failure: Secondary | ICD-10-CM | POA: Diagnosis not present

## 2015-01-18 DIAGNOSIS — L309 Dermatitis, unspecified: Secondary | ICD-10-CM | POA: Insufficient documentation

## 2015-01-18 DIAGNOSIS — Z952 Presence of prosthetic heart valve: Secondary | ICD-10-CM | POA: Insufficient documentation

## 2015-01-18 DIAGNOSIS — E669 Obesity, unspecified: Secondary | ICD-10-CM | POA: Insufficient documentation

## 2015-01-18 DIAGNOSIS — Z9861 Coronary angioplasty status: Secondary | ICD-10-CM | POA: Insufficient documentation

## 2015-01-18 DIAGNOSIS — Z880 Allergy status to penicillin: Secondary | ICD-10-CM | POA: Diagnosis not present

## 2015-01-18 DIAGNOSIS — I82B12 Acute embolism and thrombosis of left subclavian vein: Secondary | ICD-10-CM | POA: Diagnosis not present

## 2015-01-18 DIAGNOSIS — I442 Atrioventricular block, complete: Secondary | ICD-10-CM | POA: Insufficient documentation

## 2015-01-18 DIAGNOSIS — Z86010 Personal history of colonic polyps: Secondary | ICD-10-CM | POA: Insufficient documentation

## 2015-01-18 DIAGNOSIS — Z95810 Presence of automatic (implantable) cardiac defibrillator: Secondary | ICD-10-CM | POA: Insufficient documentation

## 2015-01-18 DIAGNOSIS — M1990 Unspecified osteoarthritis, unspecified site: Secondary | ICD-10-CM | POA: Insufficient documentation

## 2015-01-18 DIAGNOSIS — Z87891 Personal history of nicotine dependence: Secondary | ICD-10-CM | POA: Insufficient documentation

## 2015-01-18 DIAGNOSIS — K219 Gastro-esophageal reflux disease without esophagitis: Secondary | ICD-10-CM | POA: Insufficient documentation

## 2015-01-18 DIAGNOSIS — E039 Hypothyroidism, unspecified: Secondary | ICD-10-CM | POA: Insufficient documentation

## 2015-01-18 DIAGNOSIS — Z85850 Personal history of malignant neoplasm of thyroid: Secondary | ICD-10-CM | POA: Insufficient documentation

## 2015-01-18 DIAGNOSIS — Z7901 Long term (current) use of anticoagulants: Secondary | ICD-10-CM | POA: Insufficient documentation

## 2015-01-18 DIAGNOSIS — E119 Type 2 diabetes mellitus without complications: Secondary | ICD-10-CM | POA: Insufficient documentation

## 2015-01-18 DIAGNOSIS — Z888 Allergy status to other drugs, medicaments and biological substances status: Secondary | ICD-10-CM | POA: Insufficient documentation

## 2015-01-18 DIAGNOSIS — Z6836 Body mass index (BMI) 36.0-36.9, adult: Secondary | ICD-10-CM | POA: Insufficient documentation

## 2015-01-18 DIAGNOSIS — E785 Hyperlipidemia, unspecified: Secondary | ICD-10-CM | POA: Insufficient documentation

## 2015-01-18 DIAGNOSIS — J449 Chronic obstructive pulmonary disease, unspecified: Secondary | ICD-10-CM | POA: Insufficient documentation

## 2015-01-18 HISTORY — PX: BI-VENTRICULAR IMPLANTABLE CARDIOVERTER DEFIBRILLATOR UPGRADE: SHX5461

## 2015-01-18 LAB — SURGICAL PCR SCREEN
MRSA, PCR: NEGATIVE
Staphylococcus aureus: NEGATIVE

## 2015-01-18 LAB — PROTIME-INR
INR: 2.93 — ABNORMAL HIGH (ref 0.00–1.49)
Prothrombin Time: 30.8 seconds — ABNORMAL HIGH (ref 11.6–15.2)

## 2015-01-18 LAB — GLUCOSE, CAPILLARY: Glucose-Capillary: 88 mg/dL (ref 70–99)

## 2015-01-18 SURGERY — BI-VENTRICULAR IMPLANTABLE CARDIOVERTER DEFIBRILLATOR UPGRADE
Anesthesia: LOCAL

## 2015-01-18 MED ORDER — FENTANYL CITRATE (PF) 100 MCG/2ML IJ SOLN
INTRAMUSCULAR | Status: AC
  Filled 2015-01-18: qty 2

## 2015-01-18 MED ORDER — MIDAZOLAM HCL 5 MG/5ML IJ SOLN
INTRAMUSCULAR | Status: AC
  Filled 2015-01-18: qty 5

## 2015-01-18 MED ORDER — LIDOCAINE HCL (PF) 1 % IJ SOLN
INTRAMUSCULAR | Status: AC
  Filled 2015-01-18: qty 60

## 2015-01-18 MED ORDER — SODIUM CHLORIDE 0.9 % IJ SOLN: 3.0000 mL | INTRAMUSCULAR | Status: DC | PRN

## 2015-01-18 MED ORDER — CHLORHEXIDINE GLUCONATE 4 % EX LIQD
60.0000 mL | Freq: Once | CUTANEOUS | Status: DC
  Filled 2015-01-18: qty 60

## 2015-01-18 MED ORDER — ONDANSETRON HCL 4 MG/2ML IJ SOLN: 4.0000 mg | Freq: Four times a day (QID) | INTRAMUSCULAR | Status: DC | PRN

## 2015-01-18 MED ORDER — SODIUM CHLORIDE 0.9 % IV SOLN: 250.0000 mL | INTRAVENOUS | Status: DC | PRN

## 2015-01-18 MED ORDER — ACETAMINOPHEN 325 MG PO TABS
650.0000 mg | ORAL_TABLET | ORAL | Status: DC | PRN
  Filled 2015-01-18: qty 2

## 2015-01-18 MED ORDER — MUPIROCIN 2 % EX OINT
1.0000 "application " | TOPICAL_OINTMENT | Freq: Once | CUTANEOUS | Status: AC
  Administered 2015-01-18: 1 via TOPICAL
  Filled 2015-01-18: qty 22

## 2015-01-18 MED ORDER — MUPIROCIN 2 % EX OINT
TOPICAL_OINTMENT | CUTANEOUS | Status: AC
  Filled 2015-01-18: qty 22

## 2015-01-18 MED ORDER — SODIUM CHLORIDE 0.9 % IJ SOLN: 3.0000 mL | Freq: Two times a day (BID) | INTRAMUSCULAR | Status: DC

## 2015-01-18 MED ORDER — HEPARIN (PORCINE) IN NACL 2-0.9 UNIT/ML-% IJ SOLN
INTRAMUSCULAR | Status: AC
  Filled 2015-01-18: qty 500

## 2015-01-18 NOTE — Discharge Instructions (Signed)
Care after proceedure  Refer to this sheet in the next few weeks. These instructions provide you with information on caring for yourself after your procedure. Your health care provider may also give you more specific instructions. Your treatment has been planned according to current medical practices, but problems sometimes occur. Call your health care provider if you have any problems or questions after your procedure. WHAT TO EXPECT AFTER THE PROCEDURE After your procedure, it is typical to have the following sensations:  Mild discomfort at the insertion site. HOME CARE INSTRUCTIONS   Take all medicines exactly as directed.  Follow any prescribed diet.  Drink more fluids for the first several days after the procedure in order to help flush dye from your kidneys. SEEK MEDICAL CARE IF:  You develop a rash.  You have fever not controlled by medicine. SEEK IMMEDIATE MEDICAL CARE IF:  There is pain, drainage, bleeding, redness, swelling, warmth or a red streak at the site of the pucture.  The area where the puncture was done becomes discolored, numb, or cool.  You have difficulty breathing or shortness of breath.  You develop chest pain.  You have excessive dizziness or fainting. Document Released: 07/02/2013 Document Revised: 09/16/2013 Document Reviewed: 07/02/2013 [facility] Patient Information 2015 [place], [place]. This information is not intended to replace advice given to you by your health care provider. Make sure you discuss any questions you have with your health care provider.

## 2015-01-18 NOTE — CV Procedure (Signed)
EP procedure Note   Procedure: left upper extremity venography  Preoperative diagnosis: chronic systolic heart failure, complete heart block, s/p DDD ICD, with her current device at [facility]  Postoperative diagnosis: Same as preop and occluded left subclavian vein  Description of the procedure: After informed consent was obtained, the patient was prepped and draped in the usual manner. 15 cc of lidocain was injected into the left arm. Venography was carried out of the left upper extremity. There was a subtotally occuded vein although it appeared to reconstitute with collaterals. The vein was punctured and a glide wire advanced up to the point of the venous occlusion. We spent 30 minutes trying to traverse the subtotally occluded vein. We were able to get into the left jugular vein but still could not get past the occlusion. The catheter and glide wire were removed and the patient was taken to recovery for observation. She will return for a different procedure in the next few weeks.   Mikle Bosworth.D.

## 2015-01-18 NOTE — H&P (Signed)
HPI [name] [name] returns today for followup. She has a h/o complete heart block and a non-ischemic CM, chronic class 3 systolic heart failure and pacing induced left BBB. She has reached ERI on her DDD ICD. She is pacing 99% of the time. No other complaints except for fatigue.  Allergies  Allergen Reactions  . Aspirin     Chronic coumadin  . Fentanyl Dermatitis    Rash with patch   . Niacin Itching  . Penicillins Other (See Comments)    Bruises      Current Outpatient Prescriptions  Medication Sig Dispense Refill  . acetaminophen (TYLENOL) 325 MG tablet Take 325-650 mg by mouth every 6 (six) hours as needed for mild pain, moderate pain or headache.    . albuterol (PROVENTIL HFA;VENTOLIN HFA) 108 (90 BASE) MCG/ACT inhaler Inhale 2 puffs into the lungs every 6 (six) hours as needed for wheezing or shortness of breath. 1 Inhaler 3  . albuterol (PROVENTIL) (2.5 MG/3ML) 0.083% nebulizer solution INHALE 1 VIAL VIA NEBULIZER THREE TIMES DAILY. 300 mL 3  . allopurinol (ZYLOPRIM) 100 MG tablet take 1 tablet by mouth once daily 90 tablet 1  . Choline Fenofibrate (TRILIPIX) 135 MG capsule Take 1 capsule (135 mg total) by mouth daily. 90 capsule 1  . clindamycin (CLEOCIN) 150 MG capsule Take 1 capsule by mouth 4 (four) times daily.  0  . Fluticasone-Salmeterol (ADVAIR DISKUS) 100-50 MCG/DOSE AEPB inhale 1 dose by mouth twice a day 60 each 4  . folic acid (FOLVITE) 1 MG tablet take 1 tablet by mouth once daily 100 tablet 6  . furosemide (LASIX) 40 MG tablet Take 1 tablet (40 mg total) by mouth daily. 90 tablet 1  . gabapentin (NEURONTIN) 300 MG capsule Take 1 capsule (300 mg total) by mouth at bedtime. 90 capsule 1  . hydrALAZINE (APRESOLINE) 25 MG tablet Take 1 tablet (25 mg total) by mouth 2 (two) times daily. 270 tablet 3  . HYDROcodone-acetaminophen (NORCO) 7.5-325 MG per tablet One tablet once daily , as  needed, for uncontrolled pain 30 tablet 0  . isosorbide dinitrate (ISOCHRON) 40 MG CR tablet Take 1/2 tablet (20mg  total) twice a day. 45 tablet 3  . KLOR-CON M20 20 MEQ tablet Take 1 tablet by mouth daily 90 tablet 1  . LANOXIN 62.5 MCG TABS Take 1 tablet by mouth every day 90 tablet 0  . Liniments (SALONPAS) PADS Apply 1 each topically as needed.    Marland Kitchen losartan (COZAAR) 50 MG tablet Take 1 tablet (50 mg total) by mouth daily. 90 tablet 1  . meclizine (ANTIVERT) 12.5 MG tablet Take 1 tablet (12.5 mg total) by mouth 2 (two) times daily as needed for dizziness. 30 tablet 2  . methocarbamol (ROBAXIN) 500 MG tablet Take 1 tablet (500 mg total) by mouth 3 (three) times daily. 30 tablet 0  . metolazone (ZAROXOLYN) 2.5 MG tablet Take 2.5 mg by mouth once a week. Uses on Wednesday    . metoprolol succinate (TOPROL-XL) 50 MG 24 hr tablet Take 100 mg by mouth daily. Take with or immediately following a meal.    . Multiple Vitamins-Minerals (CENTRUM SILVER ADULT 50+) TABS Take 1 tablet by mouth once a week.    . ondansetron (ZOFRAN) 4 MG tablet Take 4 mg by mouth daily as needed for nausea.    . polyethylene glycol powder (MIRALAX) powder Take 17 g by mouth daily as needed for mild constipation.     Marland Kitchen SYNTHROID 75 MCG tablet Take 1  tablet by mouth daily 90 tablet 0  . temazepam (RESTORIL) 30 MG capsule take 1 capsule at bedtime if needed 30 capsule 2  . VYTORIN 10-20 MG per tablet Take 1 tablet by mouth 3 times weekly 39 tablet 0  . warfarin (COUMADIN) 5 MG tablet Take as directed by coumadin clinic 10 tablet 0   No current facility-administered medications for this visit.   Facility-Administered Medications Ordered in Other Visits  Medication Dose Route Frequency Provider Last Rate Last Dose  . epoetin alfa (EPOGEN,PROCRIT) injection 40,000 Units 40,000 Units Subcutaneous Once Farrel Gobble, MD        Past Medical History  Diagnosis Date  . DJD (degenerative joint disease) of lumbar spine   . Chronic kidney disease, stage I   . Esophageal reflux   . Other and unspecified hyperlipidemia   . Other primary cardiomyopathies   . Obesity, unspecified   . Unspecified hypothyroidism   . DM (diabetes mellitus)   . Essential hypertension, benign   . 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 Pain in joint, pelvic region and thigh   . Insomnia, unspecified   . Iron deficiency 10/04/2011  . IDA (iron deficiency anemia)     Parenteral iron/[name]  . CHF (congestive heart failure)   . COPD (chronic obstructive pulmonary disease)   . Heart murmur   . ICD (implantable cardiac defibrillator) in place   . Adenomatous polyp of colon 12/16/2002    Dr. [name]/[facility]  . Hyperlipemia   . Hemolysis 09/24/2012  . Cancer   . Valvular disease   . Thyroid cancer     remote thyroidectomy, no recurrence  . CHB (complete heart block) 10/07/2014    ROS:  All systems reviewed and negative except as noted in the HPI.   Past Surgical History  Procedure Laterality Date  . Thyroidectomy    . Pacemaker placed  2004  . Aortic and mitral valve replacement  2001  . Right breast cyst removed      benign   . Right cataract removed      2005  . Defibrillator implanted 2006    . Cardiac valve replacement    . Cardiac catheterization    . Insert / replace / remove pacemaker    . Esophagogastroduodenoscopy  06/12/2007    Rourk- normal esophagus, small hiatal hernia, otherwise normal stomach, D1, D2  . Colonoscopy  06/12/2007    Rourk- Normal rectum/Normal colon  . Colonoscopy  12/16/02    small hemorrhoids  . Colonoscopy  06/20/2012    Procedure: COLONOSCOPY; Surgeon:  Daneil Dolin, MD; Location[facility]; Service: Endoscopy; Laterality: N/A; 10:45  . Doppler echocardiography  2012  . Doppler echocardiography  05,06,07,08,09,2011     Family History  Problem Relation Age of Onset  . Cancer Mother     behind pancres  . Heart disease Father   . Heart disease Sister     Heart attack  . Heart disease Brother   . Diabetes Brother      History   Social History  . Marital Status: Widowed    Spouse Name: N/A  . Number of Children: 0  . Years of Education: N/A   Occupational History  . retired    Social History Main Topics  . Smoking status: Former Smoker -- 0.75 packs/day for 40 years    Types: Cigarettes    Quit date: 03/08/1987  . Smokeless tobacco: Never Used  .  Alcohol Use: No  . Drug Use: No  . Sexual Activity: Not on file   Other Topics Concern  . Not on file   Social History Narrative   From [place] (2004)     BP 120/62 mmHg  Pulse 74  Ht 5\' 7"  (1.702 m)  Wt 228 lb (103.42 kg)  BMI 35.70 kg/m2  SpO2 92%  Physical Exam:  Well appearing 81 yo woman, NAD HEENT: Unremarkable Neck: 8 cm JVD, no thyromegally Back: No CVA tenderness Lungs: Clear with no wheezes HEART: Regular rate rhythm, no murmurs, no rubs, no clicks Abd: soft, positive bowel sounds, no organomegally, no rebound, no guarding Ext: 2 plus pulses, no edema, no cyanosis, no clubbing Skin: No rashes no nodules Neuro: CN II through XII intact, motor grossly intact   DEVICE  Normal device function. See PaceArt for details.   Assess/Plan:            Chronic systolic heart failure - Evans Lance, MD at 12/18/2014 11:38 AM     Status: Written Related Problem: Chronic systolic heart failure   Expand All Collapse All   She has class 3 symptoms and pacing induced LBBB. I have scheduled her to undergo upgrade to a BiV ICD. We  will schedule in the next few weeks after she has undergone removal of her infected tooth. She will need a venogram prior to her procedure to confirm the her vein is patient.            Essential hypertension - Evans Lance, MD at 12/18/2014 11:38 AM     Status: Written Related Problem: Essential hypertension   Expand All Collapse All   Her blood pressure is well controlled. No change in her meds.            AICD (automatic cardioverter/defibrillator) present - Evans Lance, MD at 12/18/2014 11:39 AM     Status: Written Related Problem: AICD (automatic cardioverter/defibrillator) present   Expand All Collapse All   She is at [facility]. Will schedule generator change and upgrade to a biv ICD            Chronic anticoagulation, on coumadin for Mechanical AVR and MVR - Evans Lance, MD at 12/18/2014 11:40 AM     Status: Written Related Problem: Chronic anticoagulation, on coumadin for Mechanical AVR and MVR   Expand All Collapse All   Will hold her coumadin one day before her procedure.       Mikle Bosworth.D.

## 2015-01-20 ENCOUNTER — Telehealth: Payer: Self-pay | Admitting: *Deleted

## 2015-01-20 ENCOUNTER — Encounter: Payer: PRIVATE HEALTH INSURANCE | Admitting: Internal Medicine

## 2015-01-20 NOTE — Telephone Encounter (Signed)
Pt told to continue coumadin 7.5mg  daily except 5mg  on Tuesdays, Thursdays and Saturdays and come for INR appt on 01/25/15.

## 2015-01-20 NOTE — Telephone Encounter (Signed)
Patient calling for Coumadin dosage instructions /tg

## 2015-01-24 HISTORY — PX: PACEMAKER INSERTION: SHX728

## 2015-01-24 IMAGING — US US BREAST*L* LIMITED INC AXILLA
1 series · 6 of 6 positions shown · non-contrast
Comparison: none

[Series 1: us breast*left* limited inc axilla · 0.06mm/px · 6 of 6 slices shown]
[im 1/6]
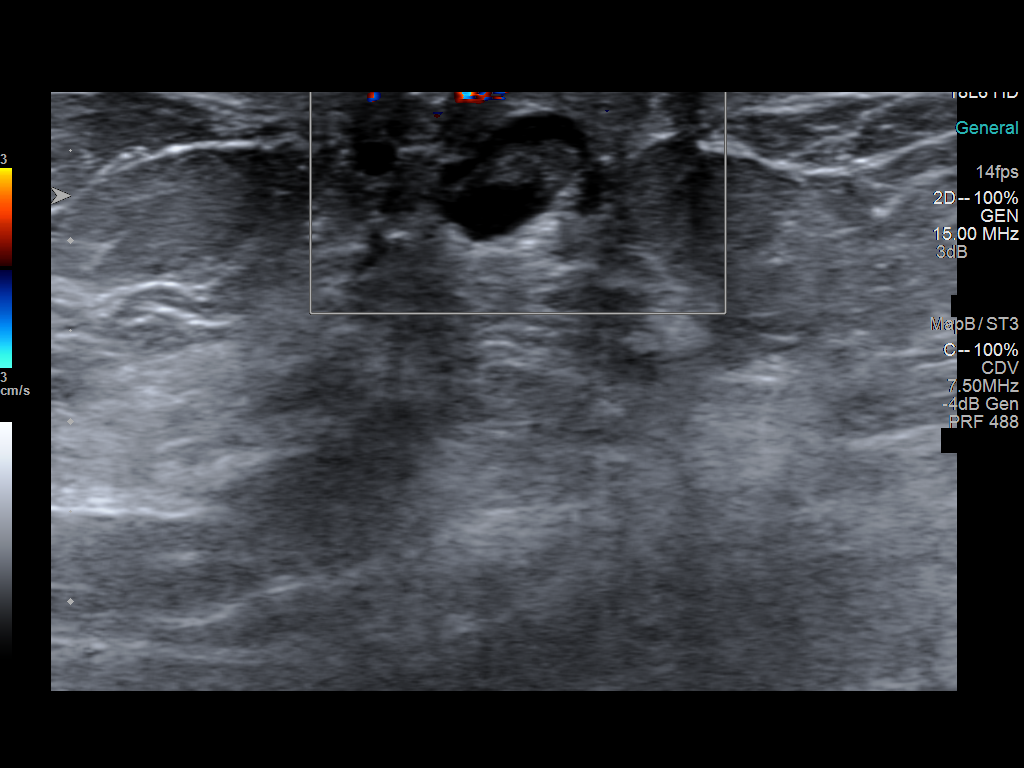
[im 2/6]
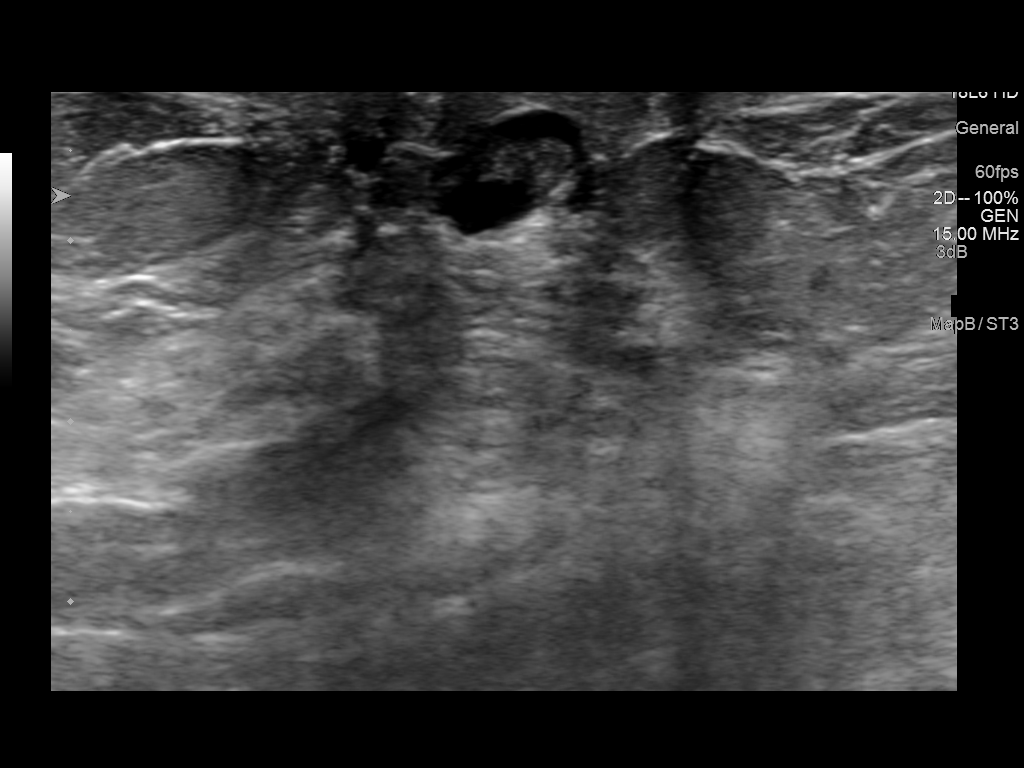
[im 3/6]
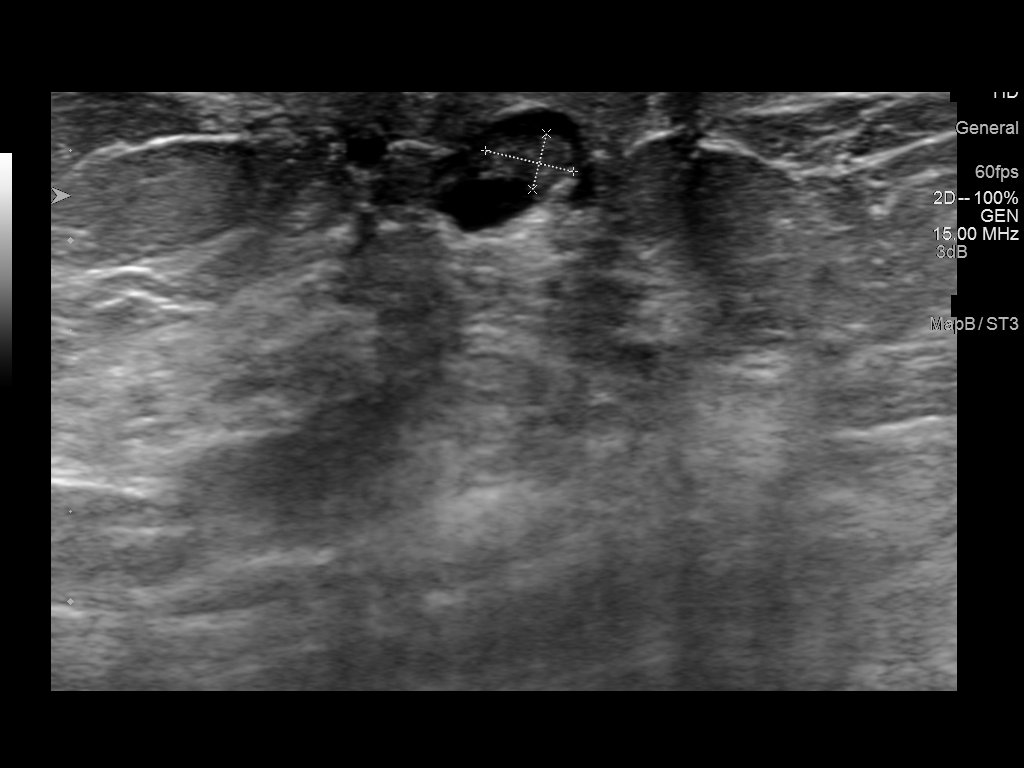
[im 4/6]
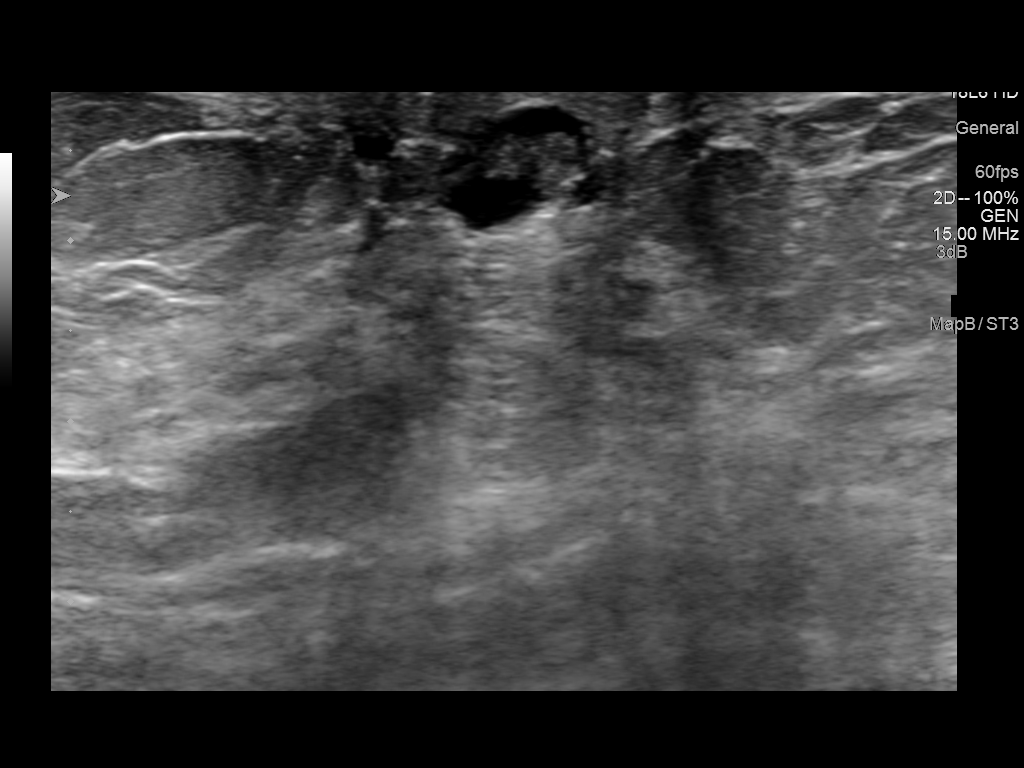
[im 5/6]
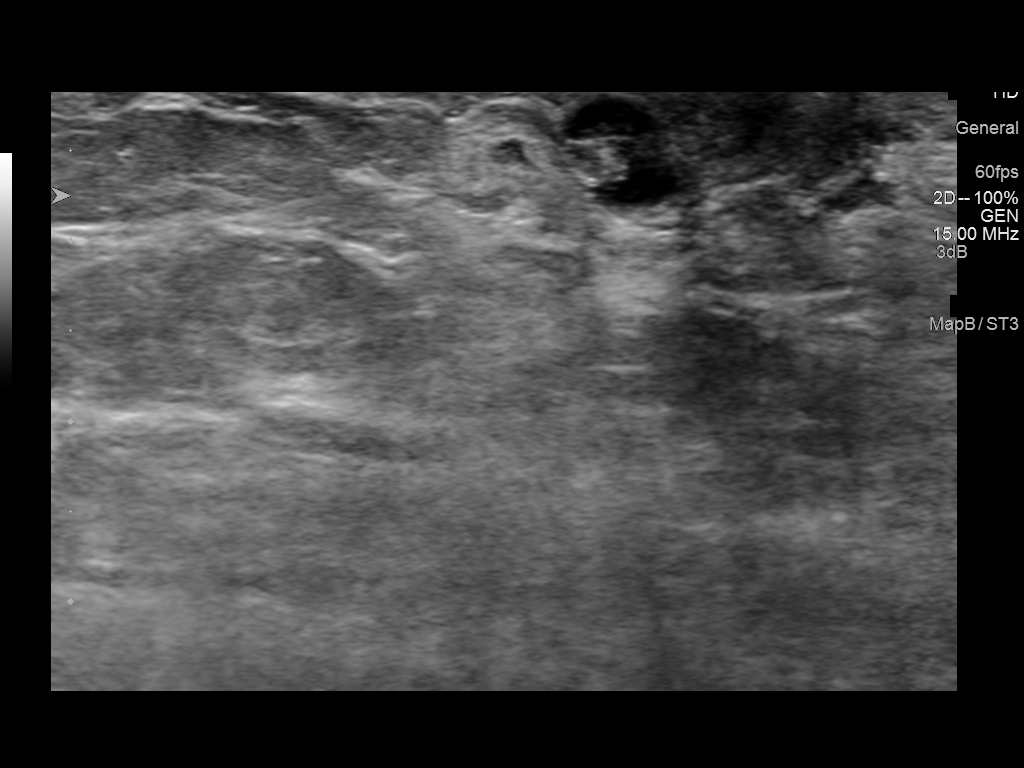
[im 6/6]
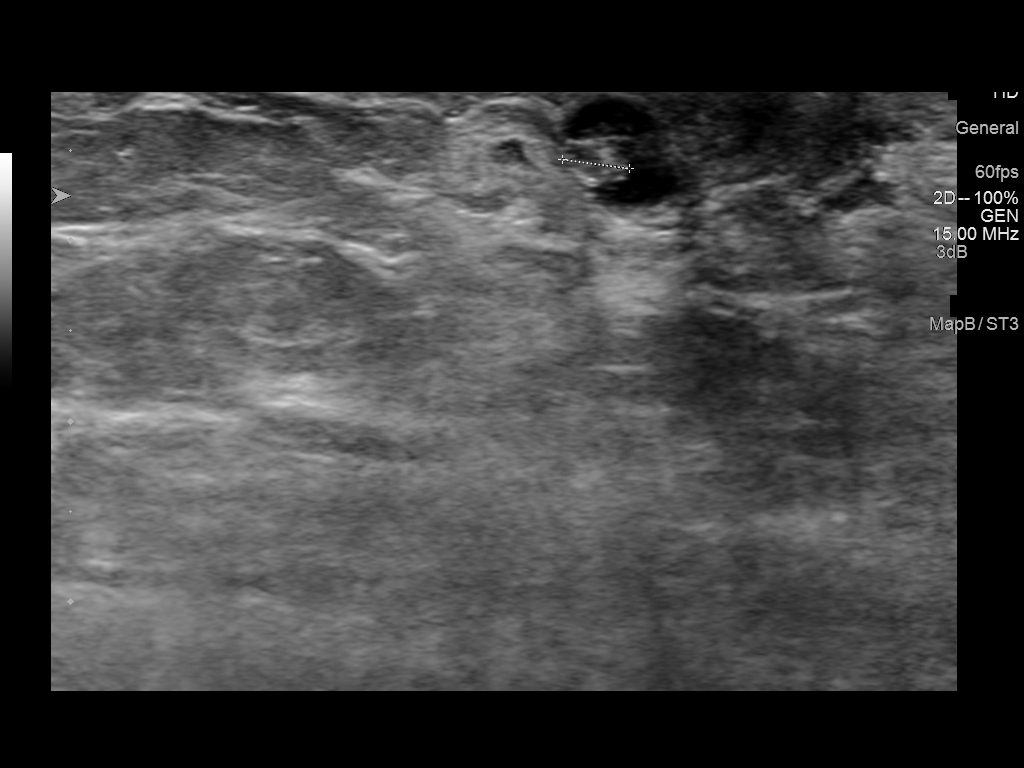

[6 of 6 positions shown; findings below may reference images not displayed]

Canned report from images found in remote index.

Refer to host system for actual result text.

## 2015-01-25 ENCOUNTER — Ambulatory Visit (INDEPENDENT_AMBULATORY_CARE_PROVIDER_SITE_OTHER): Payer: Medicare Other | Admitting: *Deleted

## 2015-01-25 DIAGNOSIS — Z7901 Long term (current) use of anticoagulants: Secondary | ICD-10-CM

## 2015-01-25 DIAGNOSIS — Z952 Presence of prosthetic heart valve: Secondary | ICD-10-CM

## 2015-01-25 DIAGNOSIS — Z5181 Encounter for therapeutic drug level monitoring: Secondary | ICD-10-CM

## 2015-01-25 DIAGNOSIS — Z954 Presence of other heart-valve replacement: Secondary | ICD-10-CM | POA: Diagnosis not present

## 2015-01-25 LAB — POCT INR: INR: 3.5

## 2015-01-26 ENCOUNTER — Encounter: Payer: PRIVATE HEALTH INSURANCE | Admitting: Internal Medicine

## 2015-01-27 ENCOUNTER — Ambulatory Visit (INDEPENDENT_AMBULATORY_CARE_PROVIDER_SITE_OTHER): Payer: Medicare Other | Admitting: Internal Medicine

## 2015-01-27 ENCOUNTER — Encounter: Payer: Self-pay | Admitting: Internal Medicine

## 2015-01-27 VITALS — BP 126/60 | HR 72 | Ht 67.0 in | Wt 227.2 lb

## 2015-01-27 DIAGNOSIS — I10 Essential (primary) hypertension: Secondary | ICD-10-CM

## 2015-01-27 DIAGNOSIS — Z7901 Long term (current) use of anticoagulants: Secondary | ICD-10-CM

## 2015-01-27 DIAGNOSIS — I255 Ischemic cardiomyopathy: Secondary | ICD-10-CM | POA: Diagnosis not present

## 2015-01-27 DIAGNOSIS — Z95810 Presence of automatic (implantable) cardiac defibrillator: Secondary | ICD-10-CM | POA: Diagnosis not present

## 2015-01-27 DIAGNOSIS — I5022 Chronic systolic (congestive) heart failure: Secondary | ICD-10-CM

## 2015-01-27 NOTE — Assessment & Plan Note (Signed)
She will need to hold her coumadin for 2 days prior to procedure. I would anticipate performing with INR in the low 2's or high one's.

## 2015-01-27 NOTE — Assessment & Plan Note (Signed)
Her blood pressure is well controlled. No change in her meds. 

## 2015-01-27 NOTE — Assessment & Plan Note (Signed)
Her symptoms remain class 3. She will continue her current meds. We discussed the risks/benefits of ICD lead removal and insertion of a new LV lead with removal of her ICD which is at [facility] and insertion of a biv PPM. We also discussed generator change out with insertion of a new PPM and removal of old ICD without an LV lead.

## 2015-01-27 NOTE — Assessment & Plan Note (Signed)
Her device is at [facility]. Will plan extraction and insertion of a BiV PM.

## 2015-01-27 NOTE — Progress Notes (Signed)
HPI [name] [name] returns today for followup. She has a h/o complete heart block and a non-ischemic CM, chronic class 3 systolic heart failure and pacing induced left BBB. She has reached ERI on her DDD ICD. She is pacing 99% of the time. No other complaints except for a rash which has developed since she started her new vitamin. When she underwent attempted LV lead insertion, we found that she had an occluded left subclavian vein. She returns today to discuss her options.  Allergies  Allergen Reactions  . Aspirin Other (See Comments)    On coumadin  . Fentanyl Dermatitis    Rash with patch   . Niacin Itching  . Penicillins Other (See Comments)    Bruises      Current Outpatient Prescriptions  Medication Sig Dispense Refill  . acetaminophen (TYLENOL) 500 MG tablet Take 500 mg by mouth every 6 (six) hours as needed (pain).    Marland Kitchen albuterol (PROVENTIL HFA;VENTOLIN HFA) 108 (90 BASE) MCG/ACT inhaler Inhale 2 puffs into the lungs every 6 (six) hours as needed for wheezing or shortness of breath. 1 Inhaler 3  . albuterol (PROVENTIL) (2.5 MG/3ML) 0.083% nebulizer solution INHALE 1 VIAL VIA NEBULIZER THREE TIMES DAILY. (Patient taking differently: INHALE 1 VIAL VIA NEBULIZER TWO TIMES DAILY.) 300 mL 3  . allopurinol (ZYLOPRIM) 100 MG tablet take 1 tablet by mouth once daily 90 tablet 1  . cholecalciferol (VITAMIN D) 1000 UNITS tablet Take 1,000 Units by mouth daily.    . Choline Fenofibrate (TRILIPIX) 135 MG capsule Take 1 capsule (135 mg total) by mouth daily. (Patient taking differently: Take 135 mg by mouth at bedtime. ) 90 capsule 1  . clindamycin (CLEOCIN) 300 MG capsule Take 600 mg by mouth See admin instructions. Take 2 capsules (600 mg) one hour prior to dental appointment  0  . docusate sodium (COLACE) 100 MG capsule Take 300 mg by mouth at bedtime.    . fluticasone (FLONASE) 50 MCG/ACT nasal spray instill 2 sprays into each nostril once daily (Patient taking differently: instill 2  sprays into each nostril once daily as needed for congestion) 16 g 2  . Fluticasone-Salmeterol (ADVAIR DISKUS) 100-50 MCG/DOSE AEPB inhale 1 dose by mouth twice a day (Patient taking differently: Inhale 1 puff into the lungs 2 (two) times daily. ) 60 each 4  . folic acid (FOLVITE) 1 MG tablet take 1 tablet by mouth once daily (Patient taking differently: Take 1 mg by mouth daily at 6 PM. ) 100 tablet 6  . furosemide (LASIX) 40 MG tablet Take 1 tablet (40 mg total) by mouth daily. 90 tablet 1  . gabapentin (NEURONTIN) 300 MG capsule Take 1 capsule (300 mg total) by mouth at bedtime. 90 capsule 1  . hydrALAZINE (APRESOLINE) 25 MG tablet Take 1 tablet (25 mg total) by mouth 2 (two) times daily. 270 tablet 3  . HYDROcodone-acetaminophen (NORCO) 7.5-325 MG per tablet Take 0.5-1 tablets by mouth 2 (two) times daily as needed (pain).  30 tablet 0  . isosorbide dinitrate (ISOCHRON) 40 MG CR tablet Take 1/2 tablet (20mg  total) twice a day. (Patient taking differently: Take 20 mg by mouth 2 (two) times daily. ) 45 tablet 3  . KLOR-CON M20 20 MEQ tablet Take 1 tablet by mouth  daily 90 tablet 1  . LANOXIN 62.5 MCG TABS Take 1 tablet by mouth  every day 90 tablet 0  . Liniments (SALONPAS) PADS Apply 1 each topically daily as needed (pain).     Marland Kitchen  losartan (COZAAR) 50 MG tablet Take 1 tablet (50 mg total) by mouth daily. 90 tablet 1  . meclizine (ANTIVERT) 12.5 MG tablet Take 1 tablet (12.5 mg total) by mouth 2 (two) times daily as needed for dizziness. 30 tablet 2  . metolazone (ZAROXOLYN) 2.5 MG tablet Take 2.5 mg by mouth once a week. On Wednesday    . metoprolol succinate (TOPROL-XL) 50 MG 24 hr tablet Take 100 mg by mouth daily at 6 PM. Take with or immediately following a meal.    . Multiple Vitamin (MULTIVITAMIN WITH MINERALS) TABS tablet Take 1 tablet by mouth daily. Centrum Silver    . ondansetron (ZOFRAN) 4 MG tablet Take 4 mg by mouth daily as needed for nausea or vomiting.     [name]  [name] (SYSTANE OP) Place 1 drop into both eyes daily as needed (dry eyes).    . polyethylene glycol powder (MIRALAX) powder Take 17 g by mouth daily as needed for mild constipation.     Marland Kitchen SYNTHROID 75 MCG tablet Take 1 tablet by mouth  daily 90 tablet 0  . temazepam (RESTORIL) 30 MG capsule take 1 capsule at bedtime if needed (Patient taking differently: take 1 capsule at bedtime if needed for sleep) 30 capsule 2  . Vitamin Mixture (ESTER-C PO) Take 1 tablet by mouth daily as needed (for immune system boost).    . VYTORIN 10-20 MG per tablet Take 1 tablet by mouth 3  times weekly (Patient taking differently: Take 1 tablet by mouth 3  times weekly- Monday, Wednesday, Friday) 39 tablet 0  . warfarin (COUMADIN) 5 MG tablet Take as directed by coumadin clinic (Patient taking differently: Take 5-7.5 mg by mouth at bedtime. Take 1 tablet (5 mg) on Tuesday, Thursday, Saturday, take 1 1/2 tablets (7.5 mg) on Sunday, Monday, Wednesday, Frday - or as directed by coumadin clinic) 10 tablet 0   No current facility-administered medications for this visit.   Facility-Administered Medications Ordered in Other Visits  Medication Dose Route Frequency Provider Last Rate Last Dose  . epoetin alfa (EPOGEN,PROCRIT) injection 40,000 Units  40,000 Units Subcutaneous Once Farrel Gobble, MD         Past Medical History  Diagnosis Date  . DJD (degenerative joint disease) of lumbar spine   . Chronic kidney disease, stage I   . Esophageal reflux   . Other and unspecified hyperlipidemia   . Other primary cardiomyopathies   . Obesity, unspecified   . Unspecified hypothyroidism   . DM (diabetes mellitus)   . Essential hypertension, benign   . 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 Pain in joint, pelvic region and thigh   . Insomnia, unspecified   . Iron deficiency 10/04/2011  . IDA (iron deficiency anemia)     Parenteral iron/[name]  . CHF (congestive heart failure)    . COPD (chronic obstructive pulmonary disease)   . Heart murmur   . ICD (implantable cardiac defibrillator) in place   . Adenomatous polyp of colon 12/16/2002    Dr. [name]/[facility]  . Hyperlipemia   . Hemolysis 09/24/2012  . Cancer   . Valvular disease   . Thyroid cancer     remote thyroidectomy, no recurrence  . CHB (complete heart block) 10/07/2014    ROS:   All systems reviewed and negative except as noted in the HPI.   Past Surgical History  Procedure Laterality Date  . Thyroidectomy    .  Pacemaker placed  2004  . Aortic and mitral valve replacement  2001  . Right breast cyst removed      benign   . Right cataract removed      2005  . Defibrillator implanted 2006    . Cardiac valve replacement    . Cardiac catheterization    . Insert / replace / remove pacemaker    . Esophagogastroduodenoscopy  06/12/2007    Rourk- normal esophagus, small hiatal hernia, otherwise normal stomach, D1, D2  . Colonoscopy  06/12/2007    Rourk- Normal rectum/Normal colon  . Colonoscopy  12/16/02    small hemorrhoids  . Colonoscopy  06/20/2012    Procedure: COLONOSCOPY;  Surgeon: Daneil Dolin, MD;  Location: [facility];  Service: Endoscopy;  Laterality: N/A;  10:45  . Doppler echocardiography  2012  . Doppler echocardiography  05,06,07,08,09,2011  . Bi-ventricular implantable cardioverter defibrillator upgrade N/A 01/18/2015    Procedure: BI-VENTRICULAR IMPLANTABLE CARDIOVERTER DEFIBRILLATOR UPGRADE;  Surgeon: Evans Lance, MD;  [facility];  Service: Cardiovascular;  Laterality: N/A;     Family History  Problem Relation Age of Onset  . Cancer Mother     behind pancres  . Heart disease Father   . Heart disease Sister     Heart attack  . Heart disease Brother   . Diabetes Brother      History   Social History  . Marital Status: Widowed    Spouse Name: N/A  . Number of Children: 0  . Years of Education: N/A   Occupational  History  . retired    Social History Main Topics  . Smoking status: Former Smoker -- 0.75 packs/day for 40 years    Types: Cigarettes    Quit date: 03/08/1987  . Smokeless tobacco: Never Used  . Alcohol Use: No  . Drug Use: No  . Sexual Activity: Not on file   Other Topics Concern  . Not on file   Social History Narrative   From [place] (2004)     BP 126/60 mmHg  Pulse 72  Ht 5\' 7"  (1.702 m)  Wt 227 lb 4 oz (103.08 kg)  BMI 35.58 kg/m2  Physical Exam:  Well appearing 81 yo woman, NAD HEENT: Unremarkable Neck:  8 cm JVD, no thyromegally Back:  No CVA tenderness Lungs:  Clear with no wheezes HEART:  Regular rate rhythm, no murmurs, no rubs, no clicks Abd:  soft, positive bowel sounds, no organomegally, no rebound, no guarding Ext:  2 plus pulses, no edema, no cyanosis, no clubbing Skin:  Patchy erythematous macular rash on arms, legs and part of trunk, no nodules Neuro:  CN II through XII intact, motor grossly intact   DEVICE  Normal device function.  See PaceArt for details.   Assess/Plan:

## 2015-01-27 NOTE — Patient Instructions (Addendum)
Medication Instructions: - no change  Labwork: - none  Procedures/Testing: - Lead extraction and Bi-V pacemaker implant. - We will call you to confirm the date and time of the procedure  Follow-Up: - this will be determined post procedure.  Any Additional Special Instructions Will Be Listed Below (If Applicable).  Arrive at [facility] Entrance "A" at _______________ on __________________ We will do labs at the hospital that morning Nothing to eat or drink after midnight the night before your procedure No medications the morning of your procedure Plan for an over night stay. You should go home the following day

## 2015-01-28 ENCOUNTER — Telehealth: Payer: Self-pay

## 2015-01-28 ENCOUNTER — Other Ambulatory Visit: Payer: Self-pay | Admitting: Family Medicine

## 2015-01-28 ENCOUNTER — Telehealth: Payer: Self-pay | Admitting: *Deleted

## 2015-01-28 MED ORDER — HYDROXYZINE HCL 10 MG PO TABS: 10.0000 mg | ORAL_TABLET | Freq: Three times a day (TID) | ORAL | Status: DC | PRN

## 2015-01-28 NOTE — Telephone Encounter (Signed)
Patient aware and med sent to pharmacy.  

## 2015-01-28 NOTE — Telephone Encounter (Signed)
Spoke with patient and she is schedule for lead extraction on 02/08/15 at 12  She is to be NPO after MN and arrive at the [facility] at 10:00am.  She is aware not to take any medications the morning of the procedure.  It is booked in OR 56 with Dr Roxan Hockey as back up

## 2015-01-28 NOTE — Telephone Encounter (Signed)
Hydroxyzine printed pls send and let her knwo of warning re lightheadedness

## 2015-02-04 ENCOUNTER — Encounter (HOSPITAL_COMMUNITY)
Admission: RE | Admit: 2015-02-04 | Discharge: 2015-02-04 | Disposition: A | Discharge status: Home / Self Care | Payer: Medicare Other | Source: Ambulatory Visit | Attending: Internal Medicine | Admitting: Internal Medicine

## 2015-02-04 ENCOUNTER — Other Ambulatory Visit: Payer: Self-pay | Admitting: Nurse Practitioner

## 2015-02-04 ENCOUNTER — Encounter (HOSPITAL_COMMUNITY): Payer: Self-pay

## 2015-02-04 HISTORY — DX: Insomnia, unspecified: G47.00

## 2015-02-04 HISTORY — DX: Dizziness and giddiness: R42

## 2015-02-04 HISTORY — DX: Gout, unspecified: M10.9

## 2015-02-04 HISTORY — DX: Constipation, unspecified: K59.00

## 2015-02-04 LAB — CBC
HCT: 29.4 % — ABNORMAL LOW (ref 36.0–46.0)
Hemoglobin: 9.3 g/dL — ABNORMAL LOW (ref 12.0–15.0)
MCH: 29.9 pg (ref 26.0–34.0)
MCHC: 31.6 g/dL (ref 30.0–36.0)
MCV: 94.5 fL (ref 78.0–100.0)
Platelets: 237 10*3/uL (ref 150–400)
RBC: 3.11 MIL/uL — ABNORMAL LOW (ref 3.87–5.11)
RDW: 15.7 % — ABNORMAL HIGH (ref 11.5–15.5)
WBC: 6.2 10*3/uL (ref 4.0–10.5)

## 2015-02-04 LAB — BASIC METABOLIC PANEL
Anion gap: 11 (ref 5–15)
BUN: 60 mg/dL — ABNORMAL HIGH (ref 6–20)
CO2: 30 mmol/L (ref 22–32)
Calcium: 9.5 mg/dL (ref 8.9–10.3)
Chloride: 100 mmol/L — ABNORMAL LOW (ref 101–111)
Creatinine, Ser: 2.99 mg/dL — ABNORMAL HIGH (ref 0.44–1.00)
GFR calc Af Amer: 16 mL/min — ABNORMAL LOW (ref >60)
GFR calc non Af Amer: 14 mL/min — ABNORMAL LOW (ref >60)
Glucose, Bld: 112 mg/dL — ABNORMAL HIGH (ref 65–99)
Potassium: 4.5 mmol/L (ref 3.5–5.1)
Sodium: 141 mmol/L (ref 135–145)

## 2015-02-04 LAB — PROTIME-INR
INR: 3.05 — ABNORMAL HIGH (ref 0.00–1.49)
Prothrombin Time: 31.8 seconds — ABNORMAL HIGH (ref 11.6–15.2)

## 2015-02-04 LAB — ABO/RH: ABO/RH(D): A POS

## 2015-02-04 MED ORDER — CHLORHEXIDINE GLUCONATE 4 % EX LIQD: 60.0000 mL | Freq: Once | CUTANEOUS | Status: DC

## 2015-02-04 NOTE — Progress Notes (Signed)
Called [name] [name] to let her know about upcoming surgery.Her voicemail said for me to call [phone] and ask for [place].I called this number to have [place] paged

## 2015-02-04 NOTE — Progress Notes (Signed)
Pt wasn't sure when to stop her Coumadin.I called Amber, NP with Dr.Taylor and she said to have her take Coumadin Fri,May 13.Amber wanted me to do PT/INR today and if her level is abnormal then she will call pt with instructions on what she needs to do.Pt verbalized understanding.

## 2015-02-04 NOTE — Progress Notes (Signed)
Multiple echo reports in epic  EKG in epic from 01-18-15  CXR in epic from 04-05-14  Medical Md is Dr.Margaret [name]  Stress test  Heart cath

## 2015-02-04 NOTE — Pre-Procedure Instructions (Signed)
[name] [name]  02/04/2015   Your procedure is scheduled on:  Mon, May 16 @ 12:30 PM  Report to [facility]  at 10:30 AM.  Call this number if you have problems the morning of surgery: [phone]   Remember:   Do not eat food or drink liquids after midnight.   Take these medicines the morning of surgery with A SIP OF WATER: Albuterol<Bring Your Inhaler With You>,Allopurinol(Zyloprim),Flonase(Fluticasone-if needed),Apresoline(Hydralazine),Pain Pill(if needed),Isosorbide(Isochron),Meclizine(Antivert-if needed),Zofran(Ondansetron-if needed),and Synthroid(Levothyroxine)              Stop taking your Coumadin as you have been instructed. No Goody's,BC's,Aleve,Aspirin,Ibuprofen,Fish Oil,or any Herbal Medications.   Do not wear jewelry, make-up or nail polish.  Do not wear lotions, powders, or perfumes.   Do not shave 48 hours prior to surgery.   Do not bring valuables to the hospital.  [facility] is not responsible                  for any belongings or valuables.               Contacts, dentures or bridgework may not be worn into surgery.  Leave suitcase in the car. After surgery it may be brought to your room.  For patients admitted to the hospital, discharge time is determined by your                treatment team.               Special Instructions:  [facility] - Preparing for Surgery  Before surgery, you can play an important role.  Because skin is not sterile, your skin needs to be as free of germs as possible.  You can reduce the number of germs on you skin by washing with CHG (chlorahexidine gluconate) soap before surgery.  CHG is an antiseptic cleaner which kills germs and bonds with the skin to continue killing germs even after washing.  Please DO NOT use if you have an allergy to CHG or antibacterial soaps.  If your skin becomes reddened/irritated stop using the CHG and inform your nurse when you arrive at Short Stay.  Do not shave (including legs and underarms) for at  least 48 hours prior to the first CHG shower.  You may shave your face.  Please follow these instructions carefully:   1.  Shower with CHG Soap the night before surgery and the                                morning of Surgery.  2.  If you choose to wash your hair, wash your hair first as usual with your       normal shampoo.  3.  After you shampoo, rinse your hair and body thoroughly to remove the                      Shampoo.  4.  Use CHG as you would any other liquid soap.  You can apply chg directly       to the skin and wash gently with scrungie or a clean washcloth.  5.  Apply the CHG Soap to your body ONLY FROM THE NECK DOWN.        Do not use on open wounds or open sores.  Avoid contact with your eyes,       ears, mouth and genitals (private parts).  Wash genitals (private  parts)       with your normal soap.  6.  Wash thoroughly, paying special attention to the area where your surgery        will be performed.  7.  Thoroughly rinse your body with warm water from the neck down.  8.  DO NOT shower/wash with your normal soap after using and rinsing off       the CHG Soap.  9.  Pat yourself dry with a clean towel.            10.  Wear clean pajamas.            11.  Place clean sheets on your bed the night of your first shower and do not        sleep with pets.  Day of Surgery  Do not apply any lotions/deoderants the morning of surgery.  Please wear clean clothes to the hospital/surgery center.     Please read over the following fact sheets that you were given: Pain Booklet, Coughing and Deep Breathing, MRSA Information and Surgical Site Infection Prevention

## 2015-02-05 ENCOUNTER — Encounter (HOSPITAL_COMMUNITY): Payer: BLUE CROSS/BLUE SHIELD

## 2015-02-05 ENCOUNTER — Encounter (HOSPITAL_COMMUNITY): Payer: PRIVATE HEALTH INSURANCE

## 2015-02-05 ENCOUNTER — Telehealth: Payer: Self-pay | Admitting: Nurse Practitioner

## 2015-02-05 NOTE — Telephone Encounter (Signed)
Left message to call back  

## 2015-02-05 NOTE — Telephone Encounter (Signed)
Creat elevated on pre-procedure labs.  Left message for patient to call.  Need to hold Losartan over the weekend and take 1/2 dose Lasix. Stop Digoxin. Needs to hold Warfarin until procedure on Monday.  Will forward to triage to follow up with patient later today as I am in the hospital today.   Chanetta Marshall, NP 02/05/2015 11:51 AM

## 2015-02-05 NOTE — Progress Notes (Signed)
Anesthesia Chart Review:  Patient is a 81 year old female scheduled for left ventricular lead extraction and reimplantation of Bi-V pacemaker on 02/08/15 by Dr. Lovena Le. DX: CHF, LV lead dislodgement. CT surgeon Dr. Roxan Hockey is back-up. (Current device is an AICD.)  Other history includes former smker, CKD, HLD, GERD, DM2, iron deficiency anemia, thyroid cancer s/p thyroidectomy, COPD, chronic systolic CHF, non-ischemic CM, CHB, s/p PPM in '04 with upgrade to AICD '06 ([employer]), s/p AVR and MVR (replacements; St. Jude) as 2 separate surgeries in 2001 and 2004 in [place], HTN. BMI is consistent with obesity. PCP is Dr. [name]. Primary cardiologist is listed as Dr. Sallyanne Kuster.   Dr. Lovena Le asked her to hold Coumadin for 2 days prior to procedure. He is anticipating performing with the procedure in the low 2's or high one's.    07/01/14 Echo: - Procedure narrative: HR 70 bpm range. - Left ventricle: Systolic function is severely reduced, estimated EF 25-30%. Global hypokinesis noted. Diastolic dysfunction noted, indeterminate grade. The cavity size was normal. Wall thickness was increased in a pattern of moderate LVH. - Aortic valve: Normally functioning St. Jude mechanical valve. No dehiscence noted. There was no significant regurgitation. Peak velocity 2.85 m/s. Mean gradient 19 mmHg. Peak gradient (S): 30 mm Hg. - Mitral valve: Normally functioning St. Jude mechanical valve. No dehiscence noted. There was no significant regurgitation. Pressure half-time: 78 ms. Mean gradient (D): 9 mm Hg. Peak gradient (D): 12 mm Hg. Valve area by continuity equation (using LVOT flow): 0.7 cm^2. - Left atrium: The atrium was severely dilated. Volume/bsa, S: 67ml/m^2. - Right ventricle: The cavity size was mildly dilated. Pacer wire or catheter noted in right ventricle. Systolic function was mildly reduced. - Right atrium: The atrium was moderately dilated. Pacer wire or catheter noted in right atrium. -  Tricuspid valve: There was mild-moderate regurgitation. - Pulmonic valve: There was mild regurgitation. - Pulmonary arteries: PA peak pressure: 59 mm Hg (S). Moderately elevated pulmonary pressures. - Pericardium, extracardiac: A trivial pericardial effusion was identified.  According to Reino Bellis notes, patient had normal coronaries by angiography in 2001. I don't see any more recent cath.   01/18/15 EKG: AV dual-paced rhythm with prolonged AV conduction.   Preoperative labs noted. H/H 9.3/29.4, down from 10.5/31.6 on 01/15/15. BUN 60 (previously 50-62), Cr 2.99 (previously 2.17-2.43 since 11/2103). PT/INR 31.8/3.05. T&S done.  Abnormal labs called to Lynnell Jude, NP with Dr. Lovena Le.  She is instructing patent to hold Losartan and warfarin over the weekend and take 1/2 dose of Lasix over the weekend.  I will order a PT/INR and BMET on arrival. If labs are felt acceptable then plan to proceed. Reviewed plan with anesthesiologist Dr. Oletta Lamas.  George Hugh [facility] Short Stay Center/Anesthesiology Phone [phone] 02/05/2015 1:22 PM

## 2015-02-07 MED ORDER — VANCOMYCIN HCL 10 G IV SOLR
1500.0000 mg | INTRAVENOUS | Status: AC
  Administered 2015-02-08: 1500 mg via INTRAVENOUS
  Filled 2015-02-07 (×2): qty 1500

## 2015-02-07 MED ORDER — SODIUM CHLORIDE 0.9 % IR SOLN
80.0000 mg | Status: DC
  Filled 2015-02-07 (×2): qty 2

## 2015-02-08 ENCOUNTER — Ambulatory Visit (HOSPITAL_COMMUNITY): Payer: Medicare Other

## 2015-02-08 ENCOUNTER — Encounter (HOSPITAL_COMMUNITY)
Admission: RE | Disposition: A | Discharge status: Home Health | Payer: Self-pay | Source: Ambulatory Visit | Attending: Internal Medicine

## 2015-02-08 ENCOUNTER — Ambulatory Visit (HOSPITAL_COMMUNITY): Payer: Medicare Other | Admitting: Vascular Surgery

## 2015-02-08 ENCOUNTER — Inpatient Hospital Stay (HOSPITAL_COMMUNITY)
Admission: RE | Admit: 2015-02-08 | Discharge: 2015-02-11 | DRG: 226 | Disposition: A | Discharge status: Home Health | Payer: Medicare Other | Source: Ambulatory Visit | Attending: Internal Medicine | Admitting: Internal Medicine

## 2015-02-08 ENCOUNTER — Ambulatory Visit (HOSPITAL_COMMUNITY): Payer: Medicare Other | Admitting: Certified Registered"

## 2015-02-08 ENCOUNTER — Encounter (HOSPITAL_COMMUNITY): Payer: Self-pay | Admitting: *Deleted

## 2015-02-08 DIAGNOSIS — Z950 Presence of cardiac pacemaker: Secondary | ICD-10-CM | POA: Insufficient documentation

## 2015-02-08 DIAGNOSIS — E669 Obesity, unspecified: Secondary | ICD-10-CM | POA: Diagnosis present

## 2015-02-08 DIAGNOSIS — E119 Type 2 diabetes mellitus without complications: Secondary | ICD-10-CM | POA: Diagnosis present

## 2015-02-08 DIAGNOSIS — I442 Atrioventricular block, complete: Principal | ICD-10-CM | POA: Diagnosis present

## 2015-02-08 DIAGNOSIS — Z85850 Personal history of malignant neoplasm of thyroid: Secondary | ICD-10-CM

## 2015-02-08 DIAGNOSIS — N181 Chronic kidney disease, stage 1: Secondary | ICD-10-CM | POA: Diagnosis present

## 2015-02-08 DIAGNOSIS — I5023 Acute on chronic systolic (congestive) heart failure: Secondary | ICD-10-CM | POA: Diagnosis not present

## 2015-02-08 DIAGNOSIS — Z885 Allergy status to narcotic agent status: Secondary | ICD-10-CM

## 2015-02-08 DIAGNOSIS — Z6835 Body mass index (BMI) 35.0-35.9, adult: Secondary | ICD-10-CM

## 2015-02-08 DIAGNOSIS — K219 Gastro-esophageal reflux disease without esophagitis: Secondary | ICD-10-CM | POA: Diagnosis present

## 2015-02-08 DIAGNOSIS — Z79899 Other long term (current) drug therapy: Secondary | ICD-10-CM

## 2015-02-08 DIAGNOSIS — I429 Cardiomyopathy, unspecified: Secondary | ICD-10-CM | POA: Diagnosis not present

## 2015-02-08 DIAGNOSIS — Z952 Presence of prosthetic heart valve: Secondary | ICD-10-CM

## 2015-02-08 DIAGNOSIS — Z4502 Encounter for adjustment and management of automatic implantable cardiac defibrillator: Secondary | ICD-10-CM | POA: Diagnosis not present

## 2015-02-08 DIAGNOSIS — Z8249 Family history of ischemic heart disease and other diseases of the circulatory system: Secondary | ICD-10-CM

## 2015-02-08 DIAGNOSIS — E785 Hyperlipidemia, unspecified: Secondary | ICD-10-CM | POA: Diagnosis present

## 2015-02-08 DIAGNOSIS — I10 Essential (primary) hypertension: Secondary | ICD-10-CM | POA: Diagnosis not present

## 2015-02-08 DIAGNOSIS — Z87891 Personal history of nicotine dependence: Secondary | ICD-10-CM

## 2015-02-08 DIAGNOSIS — Z452 Encounter for adjustment and management of vascular access device: Secondary | ICD-10-CM

## 2015-02-08 DIAGNOSIS — J449 Chronic obstructive pulmonary disease, unspecified: Secondary | ICD-10-CM | POA: Diagnosis not present

## 2015-02-08 DIAGNOSIS — Z95810 Presence of automatic (implantable) cardiac defibrillator: Secondary | ICD-10-CM

## 2015-02-08 DIAGNOSIS — I509 Heart failure, unspecified: Secondary | ICD-10-CM | POA: Diagnosis not present

## 2015-02-08 DIAGNOSIS — I447 Left bundle-branch block, unspecified: Secondary | ICD-10-CM | POA: Diagnosis present

## 2015-02-08 DIAGNOSIS — Z886 Allergy status to analgesic agent status: Secondary | ICD-10-CM

## 2015-02-08 DIAGNOSIS — N179 Acute kidney failure, unspecified: Secondary | ICD-10-CM | POA: Diagnosis not present

## 2015-02-08 DIAGNOSIS — I5043 Acute on chronic combined systolic (congestive) and diastolic (congestive) heart failure: Secondary | ICD-10-CM

## 2015-02-08 DIAGNOSIS — Z7901 Long term (current) use of anticoagulants: Secondary | ICD-10-CM

## 2015-02-08 DIAGNOSIS — T82129A Displacement of unspecified cardiac electronic device, initial encounter: Secondary | ICD-10-CM | POA: Diagnosis not present

## 2015-02-08 DIAGNOSIS — I5022 Chronic systolic (congestive) heart failure: Secondary | ICD-10-CM | POA: Diagnosis not present

## 2015-02-08 DIAGNOSIS — Z833 Family history of diabetes mellitus: Secondary | ICD-10-CM

## 2015-02-08 DIAGNOSIS — E890 Postprocedural hypothyroidism: Secondary | ICD-10-CM | POA: Diagnosis present

## 2015-02-08 DIAGNOSIS — Z880 Allergy status to penicillin: Secondary | ICD-10-CM

## 2015-02-08 DIAGNOSIS — I129 Hypertensive chronic kidney disease with stage 1 through stage 4 chronic kidney disease, or unspecified chronic kidney disease: Secondary | ICD-10-CM | POA: Diagnosis present

## 2015-02-08 DIAGNOSIS — I517 Cardiomegaly: Secondary | ICD-10-CM | POA: Diagnosis not present

## 2015-02-08 HISTORY — PX: IMPLANTABLE CARDIOVERTER DEFIBRILLATOR (ICD) GENERATOR CHANGE: SHX5469

## 2015-02-08 HISTORY — DX: Other cardiomyopathies: I42.8

## 2015-02-08 HISTORY — PX: ICD LEAD REMOVAL: SHX5855

## 2015-02-08 LAB — BASIC METABOLIC PANEL
Anion gap: 11 (ref 5–15)
BUN: 60 mg/dL — ABNORMAL HIGH (ref 6–20)
CO2: 26 mmol/L (ref 22–32)
Calcium: 9.1 mg/dL (ref 8.9–10.3)
Chloride: 101 mmol/L (ref 101–111)
Creatinine, Ser: 2.5 mg/dL — ABNORMAL HIGH (ref 0.44–1.00)
GFR calc Af Amer: 20 mL/min — ABNORMAL LOW (ref >60)
GFR calc non Af Amer: 17 mL/min — ABNORMAL LOW (ref >60)
Glucose, Bld: 93 mg/dL (ref 65–99)
Potassium: 4.5 mmol/L (ref 3.5–5.1)
Sodium: 138 mmol/L (ref 135–145)

## 2015-02-08 LAB — GLUCOSE, CAPILLARY
Glucose-Capillary: 81 mg/dL (ref 65–99)
Glucose-Capillary: 82 mg/dL (ref 65–99)
Glucose-Capillary: 108 mg/dL — ABNORMAL HIGH (ref 65–99)
Glucose-Capillary: 96 mg/dL (ref 65–99)

## 2015-02-08 LAB — PROTIME-INR
INR: 2.19 — ABNORMAL HIGH (ref 0.00–1.49)
Prothrombin Time: 24.6 seconds — ABNORMAL HIGH (ref 11.6–15.2)

## 2015-02-08 LAB — PREPARE RBC (CROSSMATCH)

## 2015-02-08 LAB — POCT I-STAT 4, (NA,K, GLUC, HGB,HCT)
Glucose, Bld: 117 mg/dL — ABNORMAL HIGH (ref 65–99)
HCT: 25 % — ABNORMAL LOW (ref 36.0–46.0)
Hemoglobin: 8.5 g/dL — ABNORMAL LOW (ref 12.0–15.0)
Potassium: 3.7 mmol/L (ref 3.5–5.1)
Sodium: 140 mmol/L (ref 135–145)

## 2015-02-08 SURGERY — REMOVAL, ELECTRODE LEAD, ICD
Anesthesia: General | Site: Chest | Laterality: Left

## 2015-02-08 MED ORDER — PROPOFOL 10 MG/ML IV BOLUS
INTRAVENOUS | Status: AC
  Filled 2015-02-08: qty 20

## 2015-02-08 MED ORDER — GENTAMICIN SULFATE 40 MG/ML IJ SOLN
INTRAMUSCULAR | Status: DC | PRN
  Administered 2015-02-08: 500 mL

## 2015-02-08 MED ORDER — LIDOCAINE HCL (PF) 1 % IJ SOLN
INTRAMUSCULAR | Status: DC | PRN
  Administered 2015-02-08: 30 mL

## 2015-02-08 MED ORDER — LOSARTAN POTASSIUM 50 MG PO TABS: 50.0000 mg | ORAL_TABLET | Freq: Every day | ORAL | Status: DC

## 2015-02-08 MED ORDER — FENTANYL CITRATE (PF) 100 MCG/2ML IJ SOLN
INTRAMUSCULAR | Status: DC | PRN
  Administered 2015-02-08: 25 ug via INTRAVENOUS
  Administered 2015-02-08 (×2): 50 ug via INTRAVENOUS

## 2015-02-08 MED ORDER — METOLAZONE 5 MG PO TABS: 2.5000 mg | ORAL_TABLET | ORAL | Status: DC

## 2015-02-08 MED ORDER — ONDANSETRON HCL 4 MG/2ML IJ SOLN
INTRAMUSCULAR | Status: AC
  Filled 2015-02-08: qty 2

## 2015-02-08 MED ORDER — LIDOCAINE HCL (PF) 1 % IJ SOLN
INTRAMUSCULAR | Status: AC
  Filled 2015-02-08: qty 30

## 2015-02-08 MED ORDER — LIDOCAINE HCL (CARDIAC) 20 MG/ML IV SOLN
INTRAVENOUS | Status: AC
  Filled 2015-02-08: qty 10

## 2015-02-08 MED ORDER — SODIUM CHLORIDE 0.9 % IV SOLN
INTRAVENOUS | Status: DC
  Administered 2015-02-08: 11:00:00 via INTRAVENOUS

## 2015-02-08 MED ORDER — HEPARIN SODIUM (PORCINE) 5000 UNIT/ML IJ SOLN
INTRAMUSCULAR | Status: DC | PRN
  Administered 2015-02-08 (×2): 500 mL

## 2015-02-08 MED ORDER — HYDROCODONE-ACETAMINOPHEN 7.5-325 MG PO TABS
0.5000 | ORAL_TABLET | Freq: Two times a day (BID) | ORAL | Status: DC | PRN
  Administered 2015-02-08: 1 via ORAL
  Filled 2015-02-08: qty 1

## 2015-02-08 MED ORDER — ACETAMINOPHEN 325 MG PO TABS
325.0000 mg | ORAL_TABLET | ORAL | Status: DC | PRN
Start: 2015-02-08 — End: 2015-02-11

## 2015-02-08 MED ORDER — ALBUTEROL SULFATE (2.5 MG/3ML) 0.083% IN NEBU: 3.0000 mL | INHALATION_SOLUTION | Freq: Four times a day (QID) | RESPIRATORY_TRACT | Status: DC | PRN

## 2015-02-08 MED ORDER — ONDANSETRON HCL 4 MG/2ML IJ SOLN: 4.0000 mg | Freq: Four times a day (QID) | INTRAMUSCULAR | Status: DC | PRN

## 2015-02-08 MED ORDER — HYDROMORPHONE HCL 1 MG/ML IJ SOLN
INTRAMUSCULAR | Status: AC
  Filled 2015-02-08: qty 1

## 2015-02-08 MED ORDER — DOCUSATE SODIUM 100 MG PO CAPS
300.0000 mg | ORAL_CAPSULE | Freq: Every day | ORAL | Status: DC
  Administered 2015-02-08 – 2015-02-10 (×3): 300 mg via ORAL
  Filled 2015-02-08 (×4): qty 3

## 2015-02-08 MED ORDER — METOPROLOL SUCCINATE ER 100 MG PO TB24
100.0000 mg | ORAL_TABLET | Freq: Every day | ORAL | Status: DC
  Administered 2015-02-09 – 2015-02-10 (×2): 100 mg via ORAL
  Filled 2015-02-08 (×2): qty 1

## 2015-02-08 MED ORDER — MOMETASONE FURO-FORMOTEROL FUM 100-5 MCG/ACT IN AERO
2.0000 | INHALATION_SPRAY | Freq: Two times a day (BID) | RESPIRATORY_TRACT | Status: DC
  Administered 2015-02-09 – 2015-02-11 (×4): 2 via RESPIRATORY_TRACT
  Filled 2015-02-08 (×2): qty 8.8

## 2015-02-08 MED ORDER — ACETAMINOPHEN 500 MG PO TABS
500.0000 mg | ORAL_TABLET | Freq: Four times a day (QID) | ORAL | Status: DC | PRN
  Administered 2015-02-09 – 2015-02-10 (×3): 500 mg via ORAL
  Filled 2015-02-08 (×3): qty 1

## 2015-02-08 MED ORDER — ARTIFICIAL TEARS OP OINT
TOPICAL_OINTMENT | OPHTHALMIC | Status: AC
  Filled 2015-02-08: qty 3.5

## 2015-02-08 MED ORDER — ALLOPURINOL 100 MG PO TABS
100.0000 mg | ORAL_TABLET | Freq: Every day | ORAL | Status: DC
  Administered 2015-02-09 – 2015-02-11 (×3): 100 mg via ORAL
  Filled 2015-02-08 (×4): qty 1

## 2015-02-08 MED ORDER — FUROSEMIDE 40 MG PO TABS
40.0000 mg | ORAL_TABLET | Freq: Every day | ORAL | Status: DC
  Administered 2015-02-08: 40 mg via ORAL
  Filled 2015-02-08: qty 1

## 2015-02-08 MED ORDER — WARFARIN SODIUM 5 MG PO TABS: 5.0000 mg | ORAL_TABLET | Freq: Once | ORAL | Status: DC

## 2015-02-08 MED ORDER — GABAPENTIN 300 MG PO CAPS
300.0000 mg | ORAL_CAPSULE | Freq: Every day | ORAL | Status: DC
  Administered 2015-02-08 – 2015-02-10 (×3): 300 mg via ORAL
  Filled 2015-02-08 (×3): qty 1

## 2015-02-08 MED ORDER — MECLIZINE HCL 25 MG PO TABS: 12.5000 mg | ORAL_TABLET | Freq: Two times a day (BID) | ORAL | Status: DC | PRN

## 2015-02-08 MED ORDER — ALBUMIN HUMAN 5 % IV SOLN
INTRAVENOUS | Status: DC | PRN
  Administered 2015-02-08 (×2): via INTRAVENOUS

## 2015-02-08 MED ORDER — PHENYLEPHRINE HCL 10 MG/ML IJ SOLN
10.0000 mg | INTRAVENOUS | Status: DC | PRN
  Administered 2015-02-08: 15 ug/min via INTRAVENOUS

## 2015-02-08 MED ORDER — SODIUM CHLORIDE 0.9 % IV SOLN
INTRAVENOUS | Status: DC | PRN
  Administered 2015-02-08 (×2): via INTRAVENOUS

## 2015-02-08 MED ORDER — HYDROMORPHONE HCL 1 MG/ML IJ SOLN
0.2500 mg | INTRAMUSCULAR | Status: DC | PRN
  Administered 2015-02-08: 0.25 mg via INTRAVENOUS

## 2015-02-08 MED ORDER — ROCURONIUM BROMIDE 100 MG/10ML IV SOLN
INTRAVENOUS | Status: DC | PRN
  Administered 2015-02-08: 50 mg via INTRAVENOUS

## 2015-02-08 MED ORDER — ONDANSETRON HCL 4 MG PO TABS
4.0000 mg | ORAL_TABLET | Freq: Four times a day (QID) | ORAL | Status: DC | PRN
  Administered 2015-02-10: 4 mg via ORAL
  Filled 2015-02-08: qty 1

## 2015-02-08 MED ORDER — ETOMIDATE 2 MG/ML IV SOLN
INTRAVENOUS | Status: AC
  Filled 2015-02-08: qty 10

## 2015-02-08 MED ORDER — VITAMIN D 1000 UNITS PO TABS
1000.0000 [IU] | ORAL_TABLET | Freq: Every day | ORAL | Status: DC
  Administered 2015-02-08 – 2015-02-11 (×4): 1000 [IU] via ORAL
  Filled 2015-02-08 (×4): qty 1

## 2015-02-08 MED ORDER — SODIUM CHLORIDE 0.9 % IV SOLN: Freq: Once | INTRAVENOUS | Status: DC

## 2015-02-08 MED ORDER — ONDANSETRON HCL 4 MG/2ML IJ SOLN: 4.0000 mg | Freq: Once | INTRAMUSCULAR | Status: DC | PRN

## 2015-02-08 MED ORDER — ARTIFICIAL TEARS OP OINT
TOPICAL_OINTMENT | OPHTHALMIC | Status: DC | PRN
  Administered 2015-02-08: 1 via OPHTHALMIC

## 2015-02-08 MED ORDER — SODIUM CHLORIDE 0.9 % IJ SOLN
INTRAMUSCULAR | Status: AC
  Filled 2015-02-08: qty 20

## 2015-02-08 MED ORDER — VANCOMYCIN HCL IN DEXTROSE 1-5 GM/200ML-% IV SOLN
1000.0000 mg | Freq: Two times a day (BID) | INTRAVENOUS | Status: AC
  Administered 2015-02-09: 1000 mg via INTRAVENOUS
  Filled 2015-02-08 (×2): qty 200

## 2015-02-08 MED ORDER — FENTANYL CITRATE (PF) 250 MCG/5ML IJ SOLN
INTRAMUSCULAR | Status: AC
  Filled 2015-02-08: qty 5

## 2015-02-08 MED ORDER — ONDANSETRON HCL 4 MG/2ML IJ SOLN
INTRAMUSCULAR | Status: DC | PRN
Start: 2015-02-08 — End: 2015-02-08
  Administered 2015-02-08: 4 mg via INTRAVENOUS

## 2015-02-08 MED ORDER — CLINDAMYCIN HCL 300 MG PO CAPS: 600.0000 mg | ORAL_CAPSULE | ORAL | Status: DC

## 2015-02-08 MED ORDER — WARFARIN - PHYSICIAN DOSING INPATIENT: Freq: Every day | Status: DC

## 2015-02-08 MED ORDER — PROPOFOL 10 MG/ML IV BOLUS
INTRAVENOUS | Status: DC | PRN
  Administered 2015-02-08: 90 mg via INTRAVENOUS

## 2015-02-08 MED ORDER — IOHEXOL 300 MG/ML  SOLN
INTRAMUSCULAR | Status: DC | PRN
  Administered 2015-02-08: 50 mL via INTRAVENOUS

## 2015-02-08 MED ORDER — HYDRALAZINE HCL 25 MG PO TABS
25.0000 mg | ORAL_TABLET | Freq: Two times a day (BID) | ORAL | Status: DC
  Administered 2015-02-10 – 2015-02-11 (×3): 25 mg via ORAL
  Filled 2015-02-08 (×4): qty 1

## 2015-02-08 SURGICAL SUPPLY — 76 items
ADAPTER SEALING SSA-EW-09 (MISCELLANEOUS) ×1 IMPLANT
ADPR INTRO LNG 9FR SL XTD WNG (MISCELLANEOUS) ×2
APL SKNCLS STERI-STRIP NONHPOA (GAUZE/BANDAGES/DRESSINGS) ×12
ATTAIN COMMAND ×1 IMPLANT
ATTAIN SELECT ×1 IMPLANT
BAG BANDED W/RUBBER/TAPE 36X54 (MISCELLANEOUS) ×1 IMPLANT
BAG DECANTER FOR FLEXI CONT (MISCELLANEOUS) IMPLANT
BAG EQP BAND 135X91 W/RBR TAPE (MISCELLANEOUS) ×2
BENZOIN TINCTURE PRP APPL 2/3 (GAUZE/BANDAGES/DRESSINGS) ×6 IMPLANT
BLADE 10 SAFETY STRL DISP (BLADE) ×3 IMPLANT
BLADE CORE FAN STRYKER (BLADE) ×1 IMPLANT
BLADE STERNUM SYSTEM 6 (BLADE) IMPLANT
BLADE SURG ROTATE 9660 (MISCELLANEOUS) IMPLANT
BNDG ADH 5X4 AIR PERM ELC (GAUZE/BANDAGES/DRESSINGS) ×2
BNDG COHESIVE 4X5 WHT NS (GAUZE/BANDAGES/DRESSINGS) ×3 IMPLANT
CANISTER SUCTION 2500CC (MISCELLANEOUS) ×3 IMPLANT
CATH ATTAIN EXT HOOK (CATHETERS) ×1 IMPLANT
CATH ATTAIN SELECT 6238TEL (CATHETERS) ×1 IMPLANT
CLSR STERI-STRIP ANTIMIC 1/2X4 (GAUZE/BANDAGES/DRESSINGS) ×1 IMPLANT
COIL ONE TIE COMPRESSION (MISCELLANEOUS) ×1 IMPLANT
CONT SPECI 4OZ STER CLIK (MISCELLANEOUS) ×1 IMPLANT
COVER TABLE BACK 60X90 (DRAPES) ×3 IMPLANT
DEVICE DISSECT PLASMABLAD 3.0S (MISCELLANEOUS) IMPLANT
DEVICE TORQUE KENDALL .025-038 (MISCELLANEOUS) ×1 IMPLANT
DRAPE C-ARM 42X72 X-RAY (DRAPES) IMPLANT
DRAPE CARDIOVASCULAR INCISE (DRAPES) ×3
DRAPE INCISE IOBAN 66X45 STRL (DRAPES) IMPLANT
DRAPE PROXIMA HALF (DRAPES) ×6 IMPLANT
DRAPE SRG 135X102X78XABS (DRAPES) ×2 IMPLANT
DRSG TEGADERM 4X4.75 (GAUZE/BANDAGES/DRESSINGS) ×1 IMPLANT
ELECT REM PT RETURN 9FT ADLT (ELECTROSURGICAL) ×6
ELECTRODE REM PT RTRN 9FT ADLT (ELECTROSURGICAL) ×4 IMPLANT
GAUZE PACKING IODOFORM 1 (PACKING) IMPLANT
GAUZE SPONGE 4X4 12PLY STRL (GAUZE/BANDAGES/DRESSINGS) ×3 IMPLANT
GAUZE SPONGE 4X4 16PLY XRAY LF (GAUZE/BANDAGES/DRESSINGS) IMPLANT
GLOVE BIOGEL PI IND STRL 6.5 (GLOVE) IMPLANT
GLOVE BIOGEL PI IND STRL 7.5 (GLOVE) ×2 IMPLANT
GLOVE BIOGEL PI INDICATOR 6.5 (GLOVE) ×1
GLOVE BIOGEL PI INDICATOR 7.5 (GLOVE) ×1
GLOVE ECLIPSE 8.0 STRL XLNG CF (GLOVE) ×3 IMPLANT
GOWN STRL REUS W/ TWL LRG LVL3 (GOWN DISPOSABLE) ×2 IMPLANT
GOWN STRL REUS W/ TWL XL LVL3 (GOWN DISPOSABLE) ×2 IMPLANT
GOWN STRL REUS W/TWL LRG LVL3 (GOWN DISPOSABLE) ×3
GOWN STRL REUS W/TWL XL LVL3 (GOWN DISPOSABLE) ×3
GUIDEWIRE ANGLED .035X150CM (WIRE) ×1 IMPLANT
GUIDEWIRE WHOLEY HI TOR 145CM (WIRE) ×1 IMPLANT
ICD VIVA QUAD XT CRT-D DTBA1Q1 (ICD Generator) ×1 IMPLANT
KIT ROOM TURNOVER OR (KITS) ×3 IMPLANT
LEAD ATTAIN PERFORMA S 4598-88 (Lead) ×1 IMPLANT
NDL PERC 18GX7CM (NEEDLE) IMPLANT
NEEDLE PERC 18GX7CM (NEEDLE) ×3 IMPLANT
NS IRRIG 1000ML POUR BTL (IV SOLUTION) IMPLANT
PAD ARMBOARD 7.5X6 YLW CONV (MISCELLANEOUS) ×6 IMPLANT
PAD ELECT DEFIB RADIOL ZOLL (MISCELLANEOUS) ×3 IMPLANT
PLASMABLADE 3.0S (MISCELLANEOUS) ×3
SAFE SHEATH SEALING ADAPTER ×1 IMPLANT
SHEATH EVOLUTION RL 11F (SHEATH) ×1 IMPLANT
SHEATH EVOLUTION SHORTE RL 11F (SHEATH) ×1 IMPLANT
SHEATH PINNACLE 6F 10CM (SHEATH) ×1 IMPLANT
SLITTER 6232ADJ (MISCELLANEOUS) ×1 IMPLANT
SPONGE GAUZE 4X4 12PLY STER LF (GAUZE/BANDAGES/DRESSINGS) ×1 IMPLANT
SPONGE LAP 18X18 X RAY DECT (DISPOSABLE) ×1 IMPLANT
STYLET LIBERATOR LOCKING (MISCELLANEOUS) ×1 IMPLANT
SUT PROLENE 2 0 CT2 30 (SUTURE) IMPLANT
SUT PROLENE 2 0 SH DA (SUTURE) IMPLANT
SUT SILK 1 TIES 10X30 (SUTURE) ×1 IMPLANT
SUT VIC AB 2-0 CT2 18 VCP726D (SUTURE) IMPLANT
SUT VIC AB 3-0 X1 27 (SUTURE) IMPLANT
SYR 20CC LL (SYRINGE) ×1 IMPLANT
SYRINGE 20CC LL (MISCELLANEOUS) ×1 IMPLANT
TOWEL OR 17X24 6PK STRL BLUE (TOWEL DISPOSABLE) ×6 IMPLANT
TOWEL OR 17X26 10 PK STRL BLUE (TOWEL DISPOSABLE) ×6 IMPLANT
TRAY FOLEY CATH 16FRSI W/METER (SET/KITS/TRAYS/PACK) ×3 IMPLANT
TUBE CONNECTING 12X1/4 (SUCTIONS) IMPLANT
WIRE MAILMAN 182CM (WIRE) ×2 IMPLANT
YANKAUER SUCT BULB TIP NO VENT (SUCTIONS) IMPLANT

## 2015-02-08 NOTE — H&P (View-Only) (Signed)
HPI [name] [name] returns today for followup. She has a h/o complete heart block and a non-ischemic CM, chronic class 3 systolic heart failure and pacing induced left BBB. She has reached ERI on her DDD ICD. She is pacing 99% of the time. No other complaints except for fatigue.  Allergies  Allergen Reactions  . Aspirin     Chronic coumadin  . Fentanyl Dermatitis    Rash with patch   . Niacin Itching  . Penicillins Other (See Comments)    Bruises      Current Outpatient Prescriptions  Medication Sig Dispense Refill  . acetaminophen (TYLENOL) 325 MG tablet Take 325-650 mg by mouth every 6 (six) hours as needed for mild pain, moderate pain or headache.    . albuterol (PROVENTIL HFA;VENTOLIN HFA) 108 (90 BASE) MCG/ACT inhaler Inhale 2 puffs into the lungs every 6 (six) hours as needed for wheezing or shortness of breath. 1 Inhaler 3  . albuterol (PROVENTIL) (2.5 MG/3ML) 0.083% nebulizer solution INHALE 1 VIAL VIA NEBULIZER THREE TIMES DAILY. 300 mL 3  . allopurinol (ZYLOPRIM) 100 MG tablet take 1 tablet by mouth once daily 90 tablet 1  . Choline Fenofibrate (TRILIPIX) 135 MG capsule Take 1 capsule (135 mg total) by mouth daily. 90 capsule 1  . clindamycin (CLEOCIN) 150 MG capsule Take 1 capsule by mouth 4 (four) times daily.  0  . Fluticasone-Salmeterol (ADVAIR DISKUS) 100-50 MCG/DOSE AEPB inhale 1 dose by mouth twice a day 60 each 4  . folic acid (FOLVITE) 1 MG tablet take 1 tablet by mouth once daily 100 tablet 6  . furosemide (LASIX) 40 MG tablet Take 1 tablet (40 mg total) by mouth daily. 90 tablet 1  . gabapentin (NEURONTIN) 300 MG capsule Take 1 capsule (300 mg total) by mouth at bedtime. 90 capsule 1  . hydrALAZINE (APRESOLINE) 25 MG tablet Take 1 tablet (25 mg total) by mouth 2 (two) times daily. 270 tablet 3  . HYDROcodone-acetaminophen (NORCO) 7.5-325 MG per tablet One tablet once daily , as  needed, for uncontrolled pain 30 tablet 0  . isosorbide dinitrate (ISOCHRON) 40 MG CR tablet Take 1/2 tablet (20mg  total) twice a day. 45 tablet 3  . KLOR-CON M20 20 MEQ tablet Take 1 tablet by mouth daily 90 tablet 1  . LANOXIN 62.5 MCG TABS Take 1 tablet by mouth every day 90 tablet 0  . Liniments (SALONPAS) PADS Apply 1 each topically as needed.    Marland Kitchen losartan (COZAAR) 50 MG tablet Take 1 tablet (50 mg total) by mouth daily. 90 tablet 1  . meclizine (ANTIVERT) 12.5 MG tablet Take 1 tablet (12.5 mg total) by mouth 2 (two) times daily as needed for dizziness. 30 tablet 2  . methocarbamol (ROBAXIN) 500 MG tablet Take 1 tablet (500 mg total) by mouth 3 (three) times daily. 30 tablet 0  . metolazone (ZAROXOLYN) 2.5 MG tablet Take 2.5 mg by mouth once a week. Uses on Wednesday    . metoprolol succinate (TOPROL-XL) 50 MG 24 hr tablet Take 100 mg by mouth daily. Take with or immediately following a meal.    . Multiple Vitamins-Minerals (CENTRUM SILVER ADULT 50+) TABS Take 1 tablet by mouth once a week.    . ondansetron (ZOFRAN) 4 MG tablet Take 4 mg by mouth daily as needed for nausea.    . polyethylene glycol powder (MIRALAX) powder Take 17 g by mouth daily as needed for mild constipation.     Marland Kitchen SYNTHROID 75 MCG tablet Take 1  tablet by mouth daily 90 tablet 0  . temazepam (RESTORIL) 30 MG capsule take 1 capsule at bedtime if needed 30 capsule 2  . VYTORIN 10-20 MG per tablet Take 1 tablet by mouth 3 times weekly 39 tablet 0  . warfarin (COUMADIN) 5 MG tablet Take as directed by coumadin clinic 10 tablet 0   No current facility-administered medications for this visit.   Facility-Administered Medications Ordered in Other Visits  Medication Dose Route Frequency Provider Last Rate Last Dose  . epoetin alfa (EPOGEN,PROCRIT) injection 40,000 Units 40,000 Units Subcutaneous Once Farrel Gobble, MD        Past Medical History  Diagnosis Date  . DJD (degenerative joint disease) of lumbar spine   . Chronic kidney disease, stage I   . Esophageal reflux   . Other and unspecified hyperlipidemia   . Other primary cardiomyopathies   . Obesity, unspecified   . Unspecified hypothyroidism   . DM (diabetes mellitus)   . Essential hypertension, benign   . 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 Pain in joint, pelvic region and thigh   . Insomnia, unspecified   . Iron deficiency 10/04/2011  . IDA (iron deficiency anemia)     Parenteral iron/[name]  . CHF (congestive heart failure)   . COPD (chronic obstructive pulmonary disease)   . Heart murmur   . ICD (implantable cardiac defibrillator) in place   . Adenomatous polyp of colon 12/16/2002    Dr. [name]/[facility]  . Hyperlipemia   . Hemolysis 09/24/2012  . Cancer   . Valvular disease   . Thyroid cancer     remote thyroidectomy, no recurrence  . CHB (complete heart block) 10/07/2014    ROS:  All systems reviewed and negative except as noted in the HPI.   Past Surgical History  Procedure Laterality Date  . Thyroidectomy    . Pacemaker placed  2004  . Aortic and mitral valve replacement  2001  . Right breast cyst removed      benign   . Right cataract removed      2005  . Defibrillator implanted 2006    . Cardiac valve replacement    . Cardiac catheterization    . Insert / replace / remove pacemaker    . Esophagogastroduodenoscopy  06/12/2007    Rourk- normal esophagus, small hiatal hernia, otherwise normal stomach, D1, D2  . Colonoscopy  06/12/2007    Rourk- Normal rectum/Normal colon  . Colonoscopy  12/16/02    small hemorrhoids  . Colonoscopy  06/20/2012    Procedure: COLONOSCOPY; Surgeon:  Daneil Dolin, MD; Location: [facility]; Service: Endoscopy; Laterality: N/A; 10:45  . Doppler echocardiography  2012  . Doppler echocardiography  05,06,07,08,09,2011     Family History  Problem Relation Age of Onset  . Cancer Mother     behind pancres  . Heart disease Father   . Heart disease Sister     Heart attack  . Heart disease Brother   . Diabetes Brother      History   Social History  . Marital Status: Widowed    Spouse Name: N/A  . Number of Children: 0  . Years of Education: N/A   Occupational History  . retired    Social History Main Topics  . Smoking status: Former Smoker -- 0.75 packs/day for 40 years    Types: Cigarettes    Quit date: 03/08/1987  . Smokeless tobacco: Never Used  .  Alcohol Use: No  . Drug Use: No  . Sexual Activity: Not on file   Other Topics Concern  . Not on file   Social History Narrative   From [place] (2004)     BP 120/62 mmHg  Pulse 74  Ht 5\' 7"  (1.702 m)  Wt 228 lb (103.42 kg)  BMI 35.70 kg/m2  SpO2 92%  Physical Exam:  Well appearing 81 yo woman, NAD HEENT: Unremarkable Neck: 8 cm JVD, no thyromegally Back: No CVA tenderness Lungs: Clear with no wheezes HEART: Regular rate rhythm, no murmurs, no rubs, no clicks Abd: soft, positive bowel sounds, no organomegally, no rebound, no guarding Ext: 2 plus pulses, no edema, no cyanosis, no clubbing Skin: No rashes no nodules Neuro: CN II through XII intact, motor grossly intact   DEVICE  Normal device function. See PaceArt for details.   Assess/Plan:            Chronic systolic heart failure - [name] Lance, MD at 12/18/2014 11:38 AM     Status: Written Related Problem: Chronic systolic heart failure   Expand All Collapse All   She has class 3 symptoms and pacing induced LBBB. I have scheduled her to undergo upgrade to a BiV ICD. We  will schedule in the next few weeks after she has undergone removal of her infected tooth. She will need a venogram prior to her procedure to confirm the her vein is patient.            Essential hypertension - [name] Lance, MD at 12/18/2014 11:38 AM     Status: Written Related Problem: Essential hypertension   Expand All Collapse All   Her blood pressure is well controlled. No change in her meds.            AICD (automatic cardioverter/defibrillator) present - [name] Lance, MD at 12/18/2014 11:39 AM     Status: Written Related Problem: AICD (automatic cardioverter/defibrillator) present   Expand All Collapse All   [facility] is [facility]. Will schedule generator change and upgrade to a biv ICD            Chronic anticoagulation, on coumadin for Mechanical AVR and MVR - [name] Lance, MD at 12/18/2014 11:40 AM     Status: Written Related Problem: Chronic anticoagulation, on coumadin for Mechanical AVR and MVR   Expand All Collapse All   Will hold her coumadin one day before her procedure.       [name] [name].D.

## 2015-02-08 NOTE — Anesthesia Postprocedure Evaluation (Signed)
  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 Roxan Hockey backing up case** ICD GENERATOR CHANGE (Left)  Patient Location: PACU  Anesthesia Type: General   Level of Consciousness: awake, alert  and oriented  Airway and Oxygen Therapy: Patient Spontanous Breathing  Post-op Pain: mild  Post-op Assessment: Post-op Vital signs reviewed  Post-op Vital Signs: Reviewed  Last Vitals:  Filed Vitals:   02/08/15 1935  BP:   Pulse:   Temp: 36.9 C  Resp:     Complications: No apparent anesthesia complications

## 2015-02-08 NOTE — Anesthesia Preprocedure Evaluation (Addendum)
Anesthesia Evaluation  Patient identified by MRN, date of birth, ID band  Reviewed: Allergy & Precautions, NPO status , Patient's Chart, lab work & pertinent test results  Airway Mallampati: II  TM Distance: >3 FB Neck ROM: Full    Dental  (+) Edentulous Upper, Partial Lower, Dental Advisory Given   Pulmonary COPDformer smoker,  breath sounds clear to auscultation        Cardiovascular hypertension, + Peripheral Vascular Disease and +CHF + Cardiac Defibrillator + Valvular Problems/Murmurs Rhythm:Regular Rate:Normal + Systolic Click    Neuro/Psych    GI/Hepatic   Endo/Other  diabetes  Renal/GU Renal InsufficiencyRenal disease     Musculoskeletal  (+) Arthritis -,   Abdominal (+) + obese,   Peds  Hematology  (+) anemia ,   Anesthesia Other Findings   Reproductive/Obstetrics                            Anesthesia Physical Anesthesia Plan  ASA: III  Anesthesia Plan: General   Post-op Pain Management:    Induction: Intravenous  Airway Management Planned: Oral ETT  Additional Equipment:   Intra-op Plan:   Post-operative Plan: Extubation in OR  Informed Consent: I have reviewed the patients History and Physical, chart, labs and discussed the procedure including the risks, benefits and alternatives for the proposed anesthesia with the patient or authorized representative who has indicated his/her understanding and acceptance.   Dental advisory given  Plan Discussed with: CRNA and Anesthesiologist  Anesthesia Plan Comments: (Nonischemic Cardiomyopathy EF 30% in complete HB pacer dependent with CHF symptoms needs biventricular pacing S/P AVR and MVR with St. Jude prostheses 2004 Renal insufficiency Cr-2.99 COPD former smoker Obesity  Plan GA with oral ETT  Roberts Gaudy, MD )        Anesthesia Quick Evaluation

## 2015-02-08 NOTE — Telephone Encounter (Signed)
Contacted the pt to inform her that per Chanetta Marshall NP, the pt is to go to the hospital for her procedure as planned today, and they will recheck her labs then.  Pt verbalized understanding and agrees with this plan.

## 2015-02-08 NOTE — Progress Notes (Signed)
Orthopedic Tech Progress Note Patient Details:  [name] [name] [birth year] [id number]  Ortho Devices Type of Ortho Device: Arm sling Ortho Device/Splint Location: lue Ortho Device/Splint Interventions: Application   Jalene Demo 02/08/2015, 8:26 PM

## 2015-02-08 NOTE — Transfer of Care (Signed)
Immediate Anesthesia Transfer of Care Note  Patient: [name] [name]  Procedure(s) Performed: Procedure(s) with comments: ICD LEAD REMOVAL (Left) - "Will plan extraction and insertion of a BiV PM"  **Dr. Roxan Hockey backing up case** ICD GENERATOR CHANGE (Left)  Patient Location: PACU  Anesthesia Type:General  Level of Consciousness: awake and oriented  Airway & Oxygen Therapy: Patient Spontanous Breathing and Patient connected to face mask oxygen  Post-op Assessment: Report given to RN, Post -op Vital signs reviewed and stable and Patient moving all extremities  Post vital signs: Reviewed and stable  Last Vitals:  Filed Vitals:   02/08/15 1126  BP: 122/55  Pulse: 71  Temp: 36.7 C  Resp: 20    Complications: No apparent anesthesia complications

## 2015-02-08 NOTE — Telephone Encounter (Signed)
Pt returned call back from Friday this a.m. Pt would like to know if she should still have her procedure done today.  New orders per Chanetta Marshall NP was never endorsed to the pt because the pt did not answer the phone.  Informed the pt that Amber will arrive this morning and we will ask her plan of care for the pt and follow-up shortly thereafter.

## 2015-02-08 NOTE — H&P (Signed)
  ICD Criteria  Current LVEF:25% ;Obtained > 3 months ago and < or = 6 months ago.   NYHA Functional Classification: Class III  Heart Failure History:  Yes, Duration of heart failure since onset is > 9 months  Non-Ischemic Dilated Cardiomyopathy History:  No.  Atrial Fibrillation/Atrial Flutter:  No.  Ventricular Tachycardia History:  No.  Cardiac Arrest History:  No  History of Syndromes with Risk of Sudden Death:  No.  Previous ICD:  Yes, ICD Type:  Dual, Reason for ICD:  Primary prevention.  25%  Electrophysiology Study: No.  Prior MI: No.  PPM: No.  OSA:  No  Patient Life Expectancy of >=1 year: Yes.  Anticoagulation Therapy:  Patient is on anticoagulation therapy, anticoagulation was held prior to procedure.   Beta Blocker Therapy:  Yes.   Ace Inhibitor/ARB Therapy:  No, Reason not on Ace Inhibitor/ARB therapy:  renal insufficiency

## 2015-02-08 NOTE — CV Procedure (Signed)
Electrophysiology procedure note  Procedure performed: Extraction of her right ventricular pacing lead with insertion of a new left ventricular pacing lead utilizing coronary sinus venography and removal of an old dual-chamber ICD and insertion of a new biventricular ICD in a patient with class IIIB congestive heart failure, EF 25%, pacing induced left bundle branch block, QRS duration greater than 180 ms, and ICD pocket revision  Preprocedure diagnosis: Complete heart block, chronic systolic heart failure, ejection fraction 25%, class IIIB congestive heart failure, pacing induced left bundle branch block, with a QRS duration of greater than 180 ms, and an occluded left subclavian vein  Postprocedure diagnosis: Same as preprocedure diagnosis  Description of the procedure: After informed consent was obtained, the patient was taken to the operating room in a fasting state. The anesthesia service was utilized to provide general anesthesia. Invasive arterial hemodynamic monitoring was carried out. An appropriate timeout was carried out. A 6 French sheath was inserted percutaneously into the right femoral vein. A 6 cm incision was carried out over the old ICD insertion site. Electrocautery was utilized to dissect down to the ICD generator. Electrocautery was utilized to free up the dense fibrous scar tissue surrounding the ICD lead and pacing leads. The generator was removed with gentle traction. The old right ventricular pacing lead was selected for removal so that we might access the central circulation. A stylette was inserted down to the tip of the lead and attempts to retract the helix were carried out. A Cook Liberator locking stylette was then inserted after the lead was cut and advanced into the tip of the right ventricle and fixed in place. The [facility] [place] short sheath was advanced over the pacing lead after the proximal portion of the lead was secured. Next the [place] sheath was advanced  over the lead. Utilizing a combination of traction and countertraction, pressure and counter pressure, the dissection sheath was advanced down to the junction of the innominate vein and superior vena cava, and the lead was removed in total. Access to the central circulation was. A Glidewire was advanced through the sheath and into the inferior vena cava by way of the right atrium. A Medtronic guiding catheter was advanced over the Glidewire and into the right atrium. The coronary sinus was cannulated with the help of a 6 [place] hexapolar EP catheter. Venography of the coronary sinus was carried out. Venography demonstrated a large posterior vein and a small lateral vein. Initial attempts to cannulate the lateral veins were successful, at the vein was quite tortuous and small. Ultimately the vein could only be accessed with the angioplasty guidewire but a pacing lead could not be advanced over the vein. Attention was then turned to the posterior vein which was much larger. An angioplasty guidewire was advanced into the vein without difficulty and both a Medtronic as well as a St. Jude quadripolar left ventricular pacing lead, serial number [id number] was advanced over the angioplasty guidewire and into the posterior vein. When the lead was advanced to the region near the apex, or was diaphragmatic stimulation. In all other locations except for 1, the capture threshold was very high. Pacing from within the vein just next to the coronary sinus demonstrated satisfactory capture threshold in the lead was deployed in this location. The guiding catheter was removed in the usual manner. Electrocautery was utilized to assure hemostasis which was difficult. A silk suture was placed at the junction of the vein and the soft tissue to secure the  LV lead in place. The sewing sleeve was secured with silk suture as well. The pocket was irrigated with antibiotic irrigation. The old atrial and defibrillator leads were then connected  to a new Medtronic biventricular ICD, serial [id number] and placed back in the subcutaneous pocket. Previously the pocket had been revised to accommodate the larger device. The pocket was irrigated with antibiotic irrigation, and the incision was closed with 2 layers of Vicryl suture. Benzoin and Steri-Strips followed by a pressure dressing were placed and the patient was returned to the recovery area in satisfactory condition.  Complications: The procedure was complicated by a modest amount of bleeding and the patient was transfused one unit of packed red blood cells electively by the anesthesiologist  Conclusion: Successful extraction of a previously implanted Medtronic right ventricular pacing lead to allow access to the central circulation with successful implantation of a new Medtronic biventricular ICD with insertion of a left ventricular pacing lead in a patient with chronic class IIIB systolic heart failure, complete heart block, and pacing induced left bundle branch block with a QRS duration of 180 ms.  Cristopher Peru, M.D.

## 2015-02-08 NOTE — Anesthesia Procedure Notes (Signed)
Procedure Name: Intubation Date/Time: 02/08/2015 1:52 PM Performed by: Gaylene Brooks Pre-anesthesia Checklist: Patient identified, Emergency Drugs available, Suction available, Patient being monitored and Timeout performed Patient Re-evaluated:Patient Re-evaluated prior to inductionOxygen Delivery Method: Circle system utilized Preoxygenation: Pre-oxygenation with 100% oxygen Intubation Type: IV induction Ventilation: Mask ventilation without difficulty and Oral airway inserted - appropriate to patient size Laryngoscope Size: Sabra Heck and 2 Grade View: Grade I Tube type: Oral Tube size: 7.5 mm Number of attempts: 1 Airway Equipment and Method: Stylet Placement Confirmation: ETT inserted through vocal cords under direct vision,  positive ETCO2 and breath sounds checked- equal and bilateral Secured at: 22 cm Tube secured with: Tape Dental Injury: Teeth and Oropharynx as per pre-operative assessment

## 2015-02-08 NOTE — Interval H&P Note (Signed)
History and Physical Interval Note:  02/08/2015 12:22 PM  [name] [name]  has presented today for surgery, with the diagnosis of chronic systolic heart failure and complete heart block The various methods of treatment have been discussed with the patient and family. After consideration of risks, benefits and other options for treatment, the patient has consented to  Extraction of a RV ICD lead and insertion of a new LV pacing lead, removal of the old DDD ICD and insertion of a BiV ICD as a surgical intervention .  The patient's history has been reviewed, patient examined, no change in status, stable for surgery.  I have reviewed the patient's chart and labs.  Questions were answered to the patient's satisfaction.     Mikle Bosworth.D.

## 2015-02-09 ENCOUNTER — Other Ambulatory Visit: Payer: Self-pay

## 2015-02-09 ENCOUNTER — Other Ambulatory Visit (HOSPITAL_COMMUNITY): Payer: PRIVATE HEALTH INSURANCE

## 2015-02-09 ENCOUNTER — Ambulatory Visit (HOSPITAL_COMMUNITY): Payer: Medicare Other

## 2015-02-09 ENCOUNTER — Ambulatory Visit (HOSPITAL_COMMUNITY): Payer: PRIVATE HEALTH INSURANCE

## 2015-02-09 DIAGNOSIS — K219 Gastro-esophageal reflux disease without esophagitis: Secondary | ICD-10-CM | POA: Diagnosis present

## 2015-02-09 DIAGNOSIS — E119 Type 2 diabetes mellitus without complications: Secondary | ICD-10-CM | POA: Diagnosis present

## 2015-02-09 DIAGNOSIS — E669 Obesity, unspecified: Secondary | ICD-10-CM | POA: Diagnosis present

## 2015-02-09 DIAGNOSIS — Z886 Allergy status to analgesic agent status: Secondary | ICD-10-CM | POA: Diagnosis not present

## 2015-02-09 DIAGNOSIS — N181 Chronic kidney disease, stage 1: Secondary | ICD-10-CM | POA: Diagnosis present

## 2015-02-09 DIAGNOSIS — Z79899 Other long term (current) drug therapy: Secondary | ICD-10-CM | POA: Diagnosis not present

## 2015-02-09 DIAGNOSIS — Z87891 Personal history of nicotine dependence: Secondary | ICD-10-CM | POA: Diagnosis not present

## 2015-02-09 DIAGNOSIS — Z6835 Body mass index (BMI) 35.0-35.9, adult: Secondary | ICD-10-CM | POA: Diagnosis not present

## 2015-02-09 DIAGNOSIS — Z95810 Presence of automatic (implantable) cardiac defibrillator: Secondary | ICD-10-CM | POA: Diagnosis not present

## 2015-02-09 DIAGNOSIS — E785 Hyperlipidemia, unspecified: Secondary | ICD-10-CM | POA: Diagnosis present

## 2015-02-09 DIAGNOSIS — N179 Acute kidney failure, unspecified: Secondary | ICD-10-CM | POA: Diagnosis not present

## 2015-02-09 DIAGNOSIS — Z85850 Personal history of malignant neoplasm of thyroid: Secondary | ICD-10-CM | POA: Diagnosis not present

## 2015-02-09 DIAGNOSIS — I442 Atrioventricular block, complete: Secondary | ICD-10-CM | POA: Diagnosis not present

## 2015-02-09 DIAGNOSIS — Z833 Family history of diabetes mellitus: Secondary | ICD-10-CM | POA: Diagnosis not present

## 2015-02-09 DIAGNOSIS — J449 Chronic obstructive pulmonary disease, unspecified: Secondary | ICD-10-CM | POA: Diagnosis not present

## 2015-02-09 DIAGNOSIS — E890 Postprocedural hypothyroidism: Secondary | ICD-10-CM | POA: Diagnosis present

## 2015-02-09 DIAGNOSIS — Z880 Allergy status to penicillin: Secondary | ICD-10-CM | POA: Diagnosis not present

## 2015-02-09 DIAGNOSIS — R05 Cough: Secondary | ICD-10-CM | POA: Diagnosis not present

## 2015-02-09 DIAGNOSIS — I429 Cardiomyopathy, unspecified: Secondary | ICD-10-CM | POA: Diagnosis not present

## 2015-02-09 DIAGNOSIS — I5023 Acute on chronic systolic (congestive) heart failure: Secondary | ICD-10-CM | POA: Diagnosis not present

## 2015-02-09 DIAGNOSIS — Z8249 Family history of ischemic heart disease and other diseases of the circulatory system: Secondary | ICD-10-CM | POA: Diagnosis not present

## 2015-02-09 DIAGNOSIS — I447 Left bundle-branch block, unspecified: Secondary | ICD-10-CM | POA: Diagnosis present

## 2015-02-09 DIAGNOSIS — I129 Hypertensive chronic kidney disease with stage 1 through stage 4 chronic kidney disease, or unspecified chronic kidney disease: Secondary | ICD-10-CM | POA: Diagnosis present

## 2015-02-09 DIAGNOSIS — Z952 Presence of prosthetic heart valve: Secondary | ICD-10-CM | POA: Diagnosis not present

## 2015-02-09 DIAGNOSIS — R509 Fever, unspecified: Secondary | ICD-10-CM | POA: Diagnosis not present

## 2015-02-09 DIAGNOSIS — Z885 Allergy status to narcotic agent status: Secondary | ICD-10-CM | POA: Diagnosis not present

## 2015-02-09 DIAGNOSIS — Z7901 Long term (current) use of anticoagulants: Secondary | ICD-10-CM | POA: Diagnosis not present

## 2015-02-09 LAB — TYPE AND SCREEN
ABO/RH(D): A POS
Antibody Screen: NEGATIVE
Unit division: 0
Unit division: 0

## 2015-02-09 LAB — BASIC METABOLIC PANEL
Anion gap: 9 (ref 5–15)
BUN: 58 mg/dL — ABNORMAL HIGH (ref 6–20)
CO2: 23 mmol/L (ref 22–32)
Calcium: 8.3 mg/dL — ABNORMAL LOW (ref 8.9–10.3)
Chloride: 108 mmol/L (ref 101–111)
Creatinine, Ser: 3.03 mg/dL — ABNORMAL HIGH (ref 0.44–1.00)
GFR calc Af Amer: 16 mL/min — ABNORMAL LOW (ref >60)
GFR calc non Af Amer: 14 mL/min — ABNORMAL LOW (ref >60)
Glucose, Bld: 146 mg/dL — ABNORMAL HIGH (ref 65–99)
Potassium: 5.4 mmol/L — ABNORMAL HIGH (ref 3.5–5.1)
Sodium: 140 mmol/L (ref 135–145)

## 2015-02-09 LAB — GLUCOSE, CAPILLARY
Glucose-Capillary: 120 mg/dL — ABNORMAL HIGH (ref 65–99)
Glucose-Capillary: 120 mg/dL — ABNORMAL HIGH (ref 65–99)
Glucose-Capillary: 102 mg/dL — ABNORMAL HIGH (ref 65–99)
Glucose-Capillary: 133 mg/dL — ABNORMAL HIGH (ref 65–99)

## 2015-02-09 LAB — PROTIME-INR
INR: 2.1 — ABNORMAL HIGH (ref 0.00–1.49)
INR: 2.15 — ABNORMAL HIGH (ref 0.00–1.49)
Prothrombin Time: 23.7 seconds — ABNORMAL HIGH (ref 11.6–15.2)
Prothrombin Time: 24.2 seconds — ABNORMAL HIGH (ref 11.6–15.2)

## 2015-02-09 MED ORDER — FUROSEMIDE 10 MG/ML IJ SOLN
40.0000 mg | Freq: Once | INTRAMUSCULAR | Status: AC
  Administered 2015-02-09: 40 mg via INTRAVENOUS

## 2015-02-09 MED ORDER — WARFARIN - PHARMACIST DOSING INPATIENT
Freq: Every day | Status: DC
Start: 2015-02-09 — End: 2015-02-11
  Administered 2015-02-09: 18:00:00

## 2015-02-09 MED ORDER — WARFARIN SODIUM 7.5 MG PO TABS
7.5000 mg | ORAL_TABLET | Freq: Once | ORAL | Status: AC
  Administered 2015-02-09: 7.5 mg via ORAL
  Filled 2015-02-09: qty 1

## 2015-02-09 MED ORDER — FUROSEMIDE 10 MG/ML IJ SOLN
INTRAMUSCULAR | Status: AC
  Filled 2015-02-09: qty 4

## 2015-02-09 NOTE — Anesthesia Postprocedure Evaluation (Signed)
  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 Roxan Hockey backing up case** ICD GENERATOR CHANGE (Left)  Patient Location: PACU  Anesthesia Type:General  Level of Consciousness: awake, alert  and oriented  Airway and Oxygen Therapy: Patient Spontanous Breathing and Patient connected to nasal cannula oxygen  Post-op Pain: mild  Post-op Assessment: Post-op Vital signs reviewed, Patient's Cardiovascular Status Stable, Respiratory Function Stable, Patent Airway and Pain level controlled  Post-op Vital Signs: stable  Last Vitals:  Filed Vitals:   02/09/15 1020  BP: 98/52  Pulse:   Temp:   Resp:     Complications: No apparent anesthesia complications

## 2015-02-09 NOTE — Progress Notes (Signed)
ANTICOAGULATION CONSULT NOTE - Initial Consult  Pharmacy Consult for Coumadin Indication: Mechanical valve  Allergies  Allergen Reactions  . Aspirin Other (See Comments)    On coumadin  . Fentanyl Dermatitis    Rash with patch   . Niacin Itching  . Penicillins Other (See Comments)    Bruises     Patient Measurements: Height: 5\' 7"  (170.2 cm) Weight: 240 lb 11.2 oz (109.181 kg) IBW/kg (Calculated) : 61.6 Heparin Dosing Weight:   Vital Signs: Temp: 98.5 F (36.9 C) (05/17 0457) Temp Source: Oral (05/17 0457) BP: 110/36 mmHg (05/17 0510) Pulse Rate: 86 (05/17 0457)  Labs:  Recent Labs  02/08/15 1059 02/08/15 1655 02/09/15 0020  HGB  --  8.5*  --   HCT  --  25.0*  --   LABPROT 24.6*  --  24.2*  INR 2.19*  --  2.15*  CREATININE 2.50*  --   --     Estimated Creatinine Clearance: 22.8 mL/min (by C-G formula based on Cr of 2.5).   Medical History: Past Medical History  Diagnosis Date  . DJD (degenerative joint disease) of lumbar spine   . Chronic kidney disease, stage I   . Esophageal reflux   . Other and unspecified hyperlipidemia     takes Vytorin daily  . Other primary cardiomyopathies   . Obesity, unspecified   . DM (diabetes mellitus)   . 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 Pain in joint, pelvic region and thigh   . Insomnia, unspecified   . Iron deficiency 10/04/2011  . IDA (iron deficiency anemia)     Parenteral iron/[name]  . Heart murmur   . ICD (implantable cardiac defibrillator) in place   . Adenomatous polyp of colon 12/16/2002    Dr. [name]/[facility]  . Hyperlipemia   . Hemolysis 09/24/2012  . Cancer   . Valvular disease   . Thyroid cancer     remote thyroidectomy, no recurrence  . COPD (chronic obstructive pulmonary disease)     Albuterol inhaler prn and Albuterol neb daily  . CHF (congestive heart failure)     takes Lasix daily   . Constipation     takes Colace  daily as well as Miralax if needed  . Gout     takes Allopurinol daily  . CHB (complete heart block) 10/07/2014    takes Coumadin daily  . Insomnia     takes Restoril nightly  . Unspecified hypothyroidism     takes Synthroid daily  . Essential hypertension, benign     takes Metoprolol,Losartan,and Isosorbide daily  . Vertigo     takes Meclizine daily as needed    Medications:  Prescriptions prior to admission  Medication Sig Dispense Refill Last Dose  . acetaminophen (TYLENOL) 500 MG tablet Take 500 mg by mouth every 6 (six) hours as needed (pain).   02/07/2015 at Unknown time  . albuterol (PROVENTIL HFA;VENTOLIN HFA) 108 (90 BASE) MCG/ACT inhaler Inhale 2 puffs into the lungs every 6 (six) hours as needed for wheezing or shortness of breath. 1 Inhaler 3 Taking  . albuterol (PROVENTIL) (2.5 MG/3ML) 0.083% nebulizer solution INHALE 1 VIAL VIA NEBULIZER THREE TIMES DAILY. (Patient taking differently: INHALE 1 VIAL VIA NEBULIZER TWO TIMES DAILY.) 300 mL 3 02/07/2015 at Unknown time  . allopurinol (ZYLOPRIM) 100 MG tablet take 1 tablet by mouth once daily 90 tablet 1 02/07/2015 at Unknown time  . cholecalciferol (VITAMIN D) 1000 UNITS tablet Take 1,000  Units by mouth daily.   02/07/2015 at Unknown time  . Choline Fenofibrate (TRILIPIX) 135 MG capsule Take 1 capsule (135 mg total) by mouth daily. (Patient taking differently: Take 135 mg by mouth at bedtime. ) 90 capsule 1 02/07/2015 at Unknown time  . clindamycin (CLEOCIN) 300 MG capsule Take 600 mg by mouth See admin instructions. Take 2 capsules (600 mg) one hour prior to dental appointment  0 Taking  . docusate sodium (COLACE) 100 MG capsule Take 300 mg by mouth at bedtime.   02/07/2015 at Unknown time  . epoetin alfa (EPOGEN,PROCRIT) 16109 UNIT/ML injection Inject 40,000 Units into the skin once.     . fluticasone (FLONASE) 50 MCG/ACT nasal spray instill 2 sprays into each nostril once daily (Patient taking differently: instill 2 sprays into each  nostril once daily as needed for congestion) 16 g 2 02/06/2015  . Fluticasone-Salmeterol (ADVAIR DISKUS) 100-50 MCG/DOSE AEPB inhale 1 dose by mouth twice a day (Patient taking differently: Inhale 1 puff into the lungs 2 (two) times daily. ) 60 each 4 02/07/2015 at Unknown time  . folic acid (FOLVITE) 1 MG tablet take 1 tablet by mouth once daily (Patient taking differently: Take 1 mg by mouth daily at 6 PM. ) 100 tablet 6 02/07/2015 at Unknown time  . furosemide (LASIX) 40 MG tablet Take 1 tablet (40 mg total) by mouth daily. 90 tablet 1 02/07/2015 at Unknown time  . gabapentin (NEURONTIN) 300 MG capsule Take 1 capsule (300 mg total) by mouth at bedtime. 90 capsule 1 02/07/2015 at Unknown time  . hydrALAZINE (APRESOLINE) 25 MG tablet Take 1 tablet (25 mg total) by mouth 2 (two) times daily. 270 tablet 3 02/07/2015 at Unknown time  . HYDROcodone-acetaminophen (NORCO) 7.5-325 MG per tablet Take 0.5-1 tablets by mouth 2 (two) times daily as needed (pain).  30 tablet 0 2- 3 days  . hydrOXYzine (ATARAX/VISTARIL) 10 MG tablet Take 1 tablet (10 mg total) by mouth 3 (three) times daily as needed. 30 tablet 0 02/07/2015 at Unknown time  . isosorbide dinitrate (ISOCHRON) 40 MG CR tablet Take 1/2 tablet (20mg  total) twice a day. (Patient taking differently: Take 20 mg by mouth 2 (two) times daily. ) 45 tablet 3 02/07/2015 at Unknown time  . KLOR-CON M20 20 MEQ tablet Take 1 tablet by mouth  daily 90 tablet 1 02/07/2015 at Unknown time  . LANOXIN 62.5 MCG TABS Take 1 tablet by mouth  every day 90 tablet 0 02/07/2015 at Unknown time  . Liniments (SALONPAS) PADS Apply 1 each topically daily as needed (pain).    Taking  . losartan (COZAAR) 50 MG tablet Take 1 tablet (50 mg total) by mouth daily. 90 tablet 1 02/07/2015 at Unknown time  . meclizine (ANTIVERT) 12.5 MG tablet Take 1 tablet (12.5 mg total) by mouth 2 (two) times daily as needed for dizziness. 30 tablet 2 ~ 1 month  . metolazone (ZAROXOLYN) 2.5 MG tablet Take 2.5 mg  by mouth once a week. On Wednesday   02/03/2015  . metoprolol succinate (TOPROL-XL) 50 MG 24 hr tablet Take 100 mg by mouth daily at 6 PM. Take with or immediately following a meal.   02/07/2015 at 1800  . Multiple Vitamin (MULTIVITAMIN WITH MINERALS) TABS tablet Take 1 tablet by mouth daily. Centrum Silver   Past Week at Unknown time  . ondansetron (ZOFRAN) 4 MG tablet Take 4 mg by mouth daily as needed for nausea or vomiting.    Past Week at Unknown time  .  Polyethyl [name] (SYSTANE OP) Place 1 drop into both eyes daily as needed (dry eyes).   02/08/2015 at Unknown time  . polyethylene glycol powder (MIRALAX) powder Take 17 g by mouth daily as needed for mild constipation.    Past Week at Unknown time  . SYNTHROID 75 MCG tablet Take 1 tablet by mouth  daily 90 tablet 0 02/08/2015 at Unknown time  . temazepam (RESTORIL) 30 MG capsule take 1 capsule at bedtime if needed (Patient taking differently: take 1 capsule at bedtime if needed for sleep) 30 capsule 2 ~ 1 month  . Vitamin Mixture (ESTER-C PO) Take 1 tablet by mouth daily as needed (for immune system boost).   Past Week at Unknown time  . VYTORIN 10-20 MG per tablet Take 1 tablet by mouth 3  times weekly (Patient taking differently: Take 1 tablet by mouth 3  times weekly- Monday, Wednesday, Friday) 39 tablet 0 02/05/2015  . warfarin (COUMADIN) 5 MG tablet Take as directed by coumadin clinic (Patient taking differently: Take 5-7.5 mg by mouth at bedtime. Take 1 tablet (5 mg) on Tuesday, Thursday, Saturday, take 1 1/2 tablets (7.5 mg) on Sunday, Monday, Wednesday, Frday - or as directed by coumadin clinic) 10 tablet 0 02/05/2015    Assessment: sHF, CHB 81 y/o F presents for extraction of a RV ICD lead and insertion of a new LV pacing lead, removal of the old DDD ICD and insertion of a BiV ICD. EF 25%. Patient has a h/o valvular heart disease s/p AVR/MVR on Coumadin PTA.  Anticoag: AVR/MVR on chronic Coumadin (5mg  TThSat, 7.5mg  MWFSun).  Patient says last dose of Coumadin (per instructions) was Fri 5/13. Admit INR 2.19 on 5/16. Today's INR 2.15. Anemia with Hgb 8.5 (11.5 back in March).   Goal of Therapy:  INR 2.5-3.5 per Coumadin clinic note Monitor platelets by anticoagulation protocol: Yes   Plan:  Coumadin 7.5mg  po x 1 tonight. Daily INR  Vaneza Pickart S. Alford Highland, PharmD, BCPS Clinical Staff Pharmacist Pager [place], [place] 02/09/2015,9:03 AM

## 2015-02-09 NOTE — Discharge Instructions (Addendum)
Supplemental Discharge Instructions for  Pacemaker/Defibrillator Patients  Activity No heavy lifting or vigorous activity with your left/right arm for 6 to 8 weeks.  Do not raise your left/right arm above your head for one week.  Gradually raise your affected arm as drawn below.           __     02/13/15                    02/14/15                      02/15/15                02/16/15  NO DRIVING for  1 week   ; you may begin driving on  [id number]   .  WOUND CARE - Keep the wound area clean and dry.  Do not get this area wet for one week. No showers for one week; you may shower on  02/16/15   . - The tape/steri-strips on your wound will fall off; do not pull them off.  No bandage is needed on the site.  DO  NOT apply any creams, oils, or ointments to the wound area. - If you notice any drainage or discharge from the wound, any swelling or bruising at the site, or you develop a fever > 101? F after you are discharged home, call the office at once.  Special Instructions - You are still able to use cellular telephones; use the ear opposite the side where you have your pacemaker/defibrillator.  Avoid carrying your cellular phone near your device. - When traveling through airports, show security personnel your identification card to avoid being screened in the metal detectors.  Ask the security personnel to use the hand wand. - Avoid arc welding equipment, MRI testing (magnetic resonance imaging), TENS units (transcutaneous nerve stimulators).  Call the office for questions about other devices. - Avoid electrical appliances that are in poor condition or are not properly grounded. - Microwave ovens are safe to be near or to operate.  Additional information for defibrillator patients should your device go off: - If your device goes off ONCE and you feel fine afterward, notify the device clinic nurses. - If your device goes off ONCE and you do not feel well afterward, call 911. - If your device  goes off TWICE, call 911. - If your device goes off THREE times in one day, call 911.  DO NOT DRIVE YOURSELF OR A FAMILY MEMBER WITH A DEFIBRILLATOR TO THE HOSPITAL--CALL 911.   Information on my medicine - Coumadin   (Warfarin)  This medication education was reviewed with me or my healthcare representative as part of my discharge preparation.  The pharmacist that spoke with me during my hospital stay was:  Wayland Salinas, [facility]  Why was Coumadin prescribed for you? Coumadin was prescribed for you because you have a blood clot or a medical condition that can cause an increased risk of forming blood clots. Blood clots can cause serious health problems by blocking the flow of blood to the heart, lung, or brain. Coumadin can prevent harmful blood clots from forming. As a reminder your indication for Coumadin is:   Blood Clot Prevention After Heart Valve Surgery  What test will check on my response to Coumadin? While on Coumadin (warfarin) you will need to have an INR test regularly to ensure that your dose is keeping you in the desired range. The  INR (international normalized ratio) number is calculated from the result of the laboratory test called prothrombin time (PT).  If an INR APPOINTMENT HAS NOT ALREADY BEEN MADE FOR YOU please schedule an appointment to have this lab work done by your health care provider within 7 days. Your INR goal is usually a number between:  2 to 3 or your provider may give you a more narrow range like 2-2.5.  Ask your health care provider during an office visit what your goal INR is.  What  do you need to  know  About  COUMADIN? Take Coumadin (warfarin) exactly as prescribed by your healthcare provider about the same time each day.  DO NOT stop taking without talking to the doctor who prescribed the medication.  Stopping without other blood clot prevention medication to take the place of Coumadin may increase your risk of developing a new clot or stroke.   Get refills before you run out.  What do you do if you miss a dose? If you miss a dose, take it as soon as you remember on the same day then continue your regularly scheduled regimen the next day.  Do not take two doses of Coumadin at the same time.  Important Safety Information A possible side effect of Coumadin (Warfarin) is an increased risk of bleeding. You should call your healthcare provider right away if you experience any of the following: ? Bleeding from an injury or your nose that does not stop. ? Unusual colored urine (red or dark brown) or unusual colored stools (red or black). ? Unusual bruising for unknown reasons. ? A serious fall or if you hit your head (even if there is no bleeding).  Some foods or medicines interact with Coumadin (warfarin) and might alter your response to warfarin. To help avoid this: ? Eat a balanced diet, maintaining a consistent amount of Vitamin K. ? Notify your provider about major diet changes you plan to make. ? Avoid alcohol or limit your intake to 1 drink for women and 2 drinks for men per day. (1 drink is 5 oz. wine, 12 oz. beer, or 1.5 oz. liquor.)  Make sure that ANY health care provider who prescribes medication for you knows that you are taking Coumadin (warfarin).  Also make sure the healthcare provider who is monitoring your Coumadin knows when you have started a new medication including herbals and non-prescription products.  Coumadin (Warfarin)  Major Drug Interactions  Increased Warfarin Effect Decreased Warfarin Effect  Alcohol (large quantities) Antibiotics (esp. Septra/Bactrim, Flagyl, Cipro) Amiodarone (Cordarone) Aspirin (ASA) Cimetidine (Tagamet) Megestrol (Megace) NSAIDs (ibuprofen, naproxen, etc.) Piroxicam (Feldene) Propafenone (Rythmol SR) Propranolol (Inderal) Isoniazid (INH) Posaconazole (Noxafil) Barbiturates (Phenobarbital) Carbamazepine (Tegretol) Chlordiazepoxide (Librium) Cholestyramine  (Questran) Griseofulvin Oral Contraceptives Rifampin Sucralfate (Carafate) Vitamin K   Coumadin (Warfarin) Major Herbal Interactions  Increased Warfarin Effect Decreased Warfarin Effect  Garlic Ginseng Ginkgo biloba Coenzyme Q10 Green tea St. Johns wort    Coumadin (Warfarin) FOOD Interactions  Eat a consistent number of servings per week of foods HIGH in Vitamin K (1 serving =  cup)  Collards (cooked, or boiled & drained) Kale (cooked, or boiled & drained) Mustard greens (cooked, or boiled & drained) Parsley *serving size only =  cup Spinach (cooked, or boiled & drained) Swiss chard (cooked, or boiled & drained) Turnip greens (cooked, or boiled & drained)  Eat a consistent number of servings per week of foods MEDIUM-HIGH in Vitamin K (1 serving = 1 cup)  Asparagus (cooked, or boiled &  drained) Broccoli (cooked, boiled & drained, or raw & chopped) Brussel sprouts (cooked, or boiled & drained) *serving size only =  cup Lettuce, raw (green leaf, endive, romaine) Spinach, raw Turnip greens, raw & chopped   These websites have more information on Coumadin (warfarin):  FailFactory.se; VeganReport.com.au;

## 2015-02-09 NOTE — Care Management Note (Signed)
Case Management Note  Patient Details  Name: [name] [name] MRN: [id number] Date of Birth: July 28, 1932  Subjective/Objective:   Pt s/p pacemaker. Pt from home and PT recommendations for SNF.                 Action/Plan: CM did attempt to speak with pt in regards to disposition to SNF. However, pt was asleep. Per Nurse pt is agreeable to SNF, however pt will need a 3 day qualifying stay for rehab due to pt has traditional medicare. CM will speak with pt in am in regards to  [facility] vs SNF.    Expected Discharge Date:  02/09/15               Expected Discharge Plan:  Skilled Nursing Facility  In-House Referral:  Clinical Social Work  Discharge planning Services  CM Consult  Post Acute Care Choice:    Choice offered to:     DME Arranged:    DME Agency:     HH Arranged:    [place] Agency:     Status of Service:     Medicare Important Message Given:    Date Medicare IM Given:    Medicare IM give by:    Date Additional Medicare IM Given:    Additional Medicare Important Message give by:     If discussed at [place] of Stay Meetings, dates discussed:    Additional Comments:  Bethena Roys, RN 02/09/2015, 3:57 PM

## 2015-02-09 NOTE — Progress Notes (Signed)
SUBJECTIVE: The patient denies chest pain.  She is slightly short of breath this morning.    CURRENT MEDICATIONS: . allopurinol  100 mg Oral Daily  . cholecalciferol  1,000 Units Oral Daily  . docusate sodium  300 mg Oral QHS  . furosemide  40 mg Oral Daily  . gabapentin  300 mg Oral QHS  . hydrALAZINE  25 mg Oral BID  . losartan  50 mg Oral Daily  . [START ON 02/10/2015] metolazone  2.5 mg Oral Weekly  . metoprolol succinate  100 mg Oral q1800  . mometasone-formoterol  2 puff Inhalation BID  . warfarin  5 mg Oral ONCE-1800  . Warfarin - Physician Dosing Inpatient   Does not apply q1800      OBJECTIVE: Physical Exam: Filed Vitals:   02/08/15 2035 02/08/15 2054 02/09/15 0457 02/09/15 0510  BP: 125/51 100/35 85/43 110/36  Pulse: 76 77 86   Temp:  99.6 F (37.6 C) 98.5 F (36.9 C)   TempSrc:  Oral Oral   Resp: 27     Height:      Weight:   240 lb 11.2 oz (109.181 kg)   SpO2: 100% 95% 93%     Intake/Output Summary (Last 24 hours) at 02/09/15 0850 Last data filed at 02/08/15 2030  Gross per 24 hour  Intake   3170 ml  Output   1165 ml  Net   2005 ml    Telemetry reveals sinus rhythm with ventricular pacing  GEN- The patient is well appearing, alert and oriented x 3 today.   Head- normocephalic, atraumatic Eyes-  Sclera clear, conjunctiva pink Ears- hearing intact Oropharynx- clear Neck- supple, no JVP Lymph- no cervical lymphadenopathy Lungs- Clear to ausculation bilaterally, normal work of breathing Heart- Regular rate and rhythm, +mechanical heart sounds GI- soft, NT, ND, + BS Extremities- no clubbing, cyanosis, or edema Skin- no rash or lesion, left chest without hematoma/ecchymosis Psych- euthymic mood, full affect Neuro- strength and sensation are intact  LABS: Basic Metabolic Panel:  Recent Labs  02/08/15 1059 02/08/15 1655  NA 138 140  K 4.5 3.7  CL 101  --   CO2 26  --   GLUCOSE 93 117*  BUN 60*  --   CREATININE 2.50*  --   CALCIUM 9.1   --    CBC:  Recent Labs  02/08/15 1655  HGB 8.5*  HCT 25.0*    RADIOLOGY: Dg Chest 2 View 02/09/2015   CLINICAL DATA:  Arm discomfort status post pacemaker placement, weakness.  EXAM: CHEST  2 VIEW  COMPARISON:  Chest x-ray of Feb 08, 2015  FINDINGS: The lungs are well-expanded. The interstitial markings remain increased bilaterally. The central pulmonary vascularity is engorged. The left hemidiaphragm is less well demonstrated today. The cardiac silhouette is enlarged. The permanent pacemaker defibrillator is unchanged in position. Aortic and mitral valve rings are visible. The right internal jugular venous catheter tip projects over the proximal SVC.  IMPRESSION: Mild CHF superimposed upon COPD. Increased density at the left lung base is consistent with atelectasis or small pleural effusion.   Electronically Signed   By: David  Martinique M.D.   On: 02/09/2015 07:54   ASSESSMENT AND PLAN:  Active Problems:   S/P ICD (internal cardiac defibrillator) procedure  1.  Chronic systolic heart failure s/p LV lead extraction and reimplantation this admission Device interrogation normal this morning CXR without ptx Left chest without hematoma/ecchymosis Pt with slight volume overload this morning.  Will check BMET and  give Lasix depending on result  2.  Valvular heart disease s/p AVR/MVR Continue Warfarin - dosing per pharmacy  3.  HTN Stable No change required today  Will ambulate today and plan discharge tomorrow.   Chanetta Marshall, NP 02/09/2015 8:55 AM  EP Attending  Patient seen and examined. I agree with the findings as documented above with minimal modification. She is stable after lead extraction and device reimplant. She appears a bit volume overloaded today and creatinine is up. Will give IV lasix today. She got minimal contrast with BiV yesterday.

## 2015-02-09 NOTE — Evaluation (Signed)
Physical Therapy Evaluation Patient Details Name: [name] [name] MRN: [id number] DOB: [birth year] Today's Date: 02/09/2015   History of Present Illness  Patient is an 81 y.o. female admitted with h/o complete heart block for ICD exchange.  PMH also positive for DJD, GERD, HLD, HTN, cardiomyopathy, DM, COPD, and CHF.  Clinical Impression  Patient presents with decreased independence with mobility due to deficits listed in PT problem list.  She will benefit from skilled PT in the acute setting to allow return home to independent level following SNF level rehab stay.     Follow Up Recommendations SNF    Equipment Recommendations  None recommended by PT    Recommendations for Other Services       Precautions / Restrictions Precautions Precautions: Fall;ICD/Pacemaker Required Braces or Orthoses: Sling Restrictions Weight Bearing Restrictions: No      Mobility  Bed Mobility               General bed mobility comments: NT, up on EOB with nursing staff  Transfers Overall transfer level: Needs assistance Equipment used: 1 person hand held assist Transfers: Sit to/from Stand Sit to Stand: Mod assist;Max assist         General transfer comment: lifting assist from edge of bed and increased assist from low armchair  Ambulation/Gait Ambulation/Gait assistance: Mod assist Ambulation Distance (Feet): 12 Feet (x 2) Assistive device: 1 person hand held assist Gait Pattern/deviations: Step-to pattern;Trunk flexed;Wide base of support;Decreased stride length;Drifts right/left     General Gait Details: assist for in room ambulation with hand hold due to left arm restricitons; wide based gait and unsteady with 30-40%  help needed for balance/stability with ambulation and cues for upright posture.  Stairs            Wheelchair Mobility    Modified Rankin (Stroke Patients Only)       Balance Overall balance assessment: Needs assistance   Sitting balance-Leahy Scale:  Fair     Standing balance support: Single extremity supported Standing balance-Leahy Scale: Poor Standing balance comment: unable to stand safely without assist and UE support                             Pertinent Vitals/Pain Pain Assessment: Faces Faces Pain Scale: Hurts little more Pain Location: left shoulder Pain Descriptors / Indicators: Aching Pain Intervention(s): Monitored during session;Repositioned    Home Living Family/patient expects to be discharged to:: Skilled nursing facility Living Arrangements: Other relatives Available Help at Discharge: Family;Available PRN/intermittently (brother who is also handicapped) Type of Home: House Home Access: Ramped entrance     Home Layout: One level Home Equipment: [place] - 2 wheels;Cane - single point      Prior Function Level of Independence: Independent with assistive device(s)               Hand Dominance   Dominant Hand: Right    Extremity/Trunk Assessment               Lower Extremity Assessment: RLE deficits/detail;LLE deficits/detail RLE Deficits / Details: AROM grossly WFL, strength hip flexion 3+/5, knee extension 4/5, ankle DF 4/5 LLE Deficits / Details: AROM grossly WFL, strength hip flexion 3+/5, knee extension 4/5, ankle DF 4/5  Cervical / Trunk Assessment: Kyphotic;Other exceptions  Communication   Communication: No difficulties  Cognition Arousal/Alertness: Awake/alert Behavior During Therapy: WFL for tasks assessed/performed Overall Cognitive Status: Within Functional Limits for tasks assessed  General Comments General comments (skin integrity, edema, etc.): Patient unable to straighten up with sling on neck so removed arm from sling and cues to let it hang so won't use it and won't pull on neck; left arm positioned on pilows in chair for comfort    Exercises        Assessment/Plan    PT Assessment Patient needs continued PT services  PT  Diagnosis Difficulty walking;Abnormality of gait;Generalized weakness   PT Problem List Decreased strength;Decreased mobility;Decreased activity tolerance;Decreased balance;Decreased knowledge of use of DME;Pain;Decreased knowledge of precautions;Decreased safety awareness  PT Treatment Interventions DME instruction;Therapeutic exercise;Gait training;Balance training   PT Goals (Current goals can be found in the Care Plan section) Acute Rehab PT Goals Patient Stated Goal: To return to independent PT Goal Formulation: With patient Time For Goal Achievement: 02/23/15 Potential to Achieve Goals: Good    Frequency Min 3X/week   Barriers to discharge Decreased caregiver support      Co-evaluation               End of Session Equipment Utilized During Treatment: Gait belt Activity Tolerance: Patient limited by fatigue Patient left: in chair;with call bell/phone within reach Nurse Communication: Mobility status    Functional Assessment Tool Used: Clinical Judgement Functional Limitation: Mobility: Walking and moving around Mobility: Walking and Moving Around Current Status [id number]): At least 40 percent but less than 60 percent impaired, limited or restricted Mobility: Walking and Moving Around Goal Status [phone]): At least 20 percent but less than 40 percent impaired, limited or restricted    Time: 1039-1109 PT Time Calculation (min) (ACUTE ONLY): 30 min   Charges:   PT Evaluation $Initial PT Evaluation Tier I: 1 Procedure PT Treatments $Gait Training: 8-22 mins   PT G Codes:   PT G-Codes **NOT FOR INPATIENT CLASS** Functional Assessment Tool Used: Clinical Judgement Functional Limitation: Mobility: Walking and moving around Mobility: Walking and Moving Around Current Status [id number]): At least 40 percent but less than 60 percent impaired, limited or restricted Mobility: Walking and Moving Around Goal Status [phone]): At least 20 percent but less than 40 percent impaired,  limited or restricted    [facility] 02/09/2015, 11:57 AM  Magda Kiel, PT [phone] 02/09/2015

## 2015-02-10 ENCOUNTER — Encounter (HOSPITAL_COMMUNITY): Payer: Self-pay | Admitting: Nurse Practitioner

## 2015-02-10 ENCOUNTER — Inpatient Hospital Stay (HOSPITAL_COMMUNITY): Payer: Medicare Other

## 2015-02-10 ENCOUNTER — Other Ambulatory Visit: Payer: Self-pay | Admitting: Family Medicine

## 2015-02-10 LAB — BASIC METABOLIC PANEL
Anion gap: 7 (ref 5–15)
BUN: 62 mg/dL — ABNORMAL HIGH (ref 6–20)
CO2: 25 mmol/L (ref 22–32)
Calcium: 8.6 mg/dL — ABNORMAL LOW (ref 8.9–10.3)
Chloride: 107 mmol/L (ref 101–111)
Creatinine, Ser: 2.91 mg/dL — ABNORMAL HIGH (ref 0.44–1.00)
GFR calc Af Amer: 16 mL/min — ABNORMAL LOW (ref >60)
GFR calc non Af Amer: 14 mL/min — ABNORMAL LOW (ref >60)
Glucose, Bld: 106 mg/dL — ABNORMAL HIGH (ref 65–99)
Potassium: 4.6 mmol/L (ref 3.5–5.1)
Sodium: 139 mmol/L (ref 135–145)

## 2015-02-10 LAB — GLUCOSE, CAPILLARY
Glucose-Capillary: 94 mg/dL (ref 65–99)
Glucose-Capillary: 133 mg/dL — ABNORMAL HIGH (ref 65–99)
Glucose-Capillary: 94 mg/dL (ref 65–99)
Glucose-Capillary: 107 mg/dL — ABNORMAL HIGH (ref 65–99)

## 2015-02-10 MED ORDER — WARFARIN SODIUM 5 MG PO TABS
5.0000 mg | ORAL_TABLET | Freq: Once | ORAL | Status: AC
  Administered 2015-02-10: 5 mg via ORAL
  Filled 2015-02-10: qty 1

## 2015-02-10 MED ORDER — FUROSEMIDE 10 MG/ML IJ SOLN
40.0000 mg | Freq: Every day | INTRAMUSCULAR | Status: DC
  Administered 2015-02-10: 40 mg via INTRAVENOUS
  Filled 2015-02-10: qty 4

## 2015-02-10 NOTE — Progress Notes (Signed)
Right IJ central line removed per orders without complications.  Vaseline gauze occlusive dressing placed and patient instructed to lye in bed for 20 minutes.  Sanda Linger

## 2015-02-10 NOTE — Progress Notes (Signed)
Patient ID: Marissa Rassel, female   DOB: 10-Sep-1932, 81 y.o.   MRN: [id number]    Patient Name: [name] [name] Date of Encounter: 02/10/2015     Active Problems:   S/P ICD (internal cardiac defibrillator) procedure    SUBJECTIVE  "my legs are sore", denies sob or chest pain. Got out of bed yesterday  CURRENT MEDS . allopurinol  100 mg Oral Daily  . cholecalciferol  1,000 Units Oral Daily  . docusate sodium  300 mg Oral QHS  . gabapentin  300 mg Oral QHS  . hydrALAZINE  25 mg Oral BID  . metoprolol succinate  100 mg Oral q1800  . mometasone-formoterol  2 puff Inhalation BID  . Warfarin - Pharmacist Dosing Inpatient   Does not apply q1800    OBJECTIVE  Filed Vitals:   02/09/15 1747 02/09/15 2022 02/09/15 2153 02/10/15 0454  BP: 130/54 100/45  122/49  Pulse: 92 87 79 69  Temp:  100.2 F (37.9 C)  98.3 F (36.8 C)  TempSrc:  Oral  Oral  Resp:   16   Height:      Weight:    240 lb 3.2 oz (108.954 kg)  SpO2:  96% 97% 99%    Intake/Output Summary (Last 24 hours) at 02/10/15 0715 Last data filed at 02/09/15 1700  Gross per 24 hour  Intake    460 ml  Output    500 ml  Net    -40 ml   Filed Weights   02/08/15 1126 02/09/15 0457 02/10/15 0454  Weight: 227 lb (102.967 kg) 240 lb 11.2 oz (109.181 kg) 240 lb 3.2 oz (108.954 kg)    PHYSICAL EXAM  General: Pleasant, NAD. Neuro: Alert and oriented X 3. Moves all extremities spontaneously. Psych: Normal affect. HEENT:  Normal  Neck: Supple without bruits or JVD. Lungs:  Resp regular and unlabored, CTA. Bandage on her left chest is clean and dry. Heart: RRR no s3, s4, or murmurs. Abdomen: Soft, non-tender, non-distended, BS + x 4.  Extremities: No clubbing, cyanosis or edema. DP/PT/Radials 2+ and equal bilaterally.  Accessory Clinical Findings  CBC  Recent Labs  02/08/15 1655  HGB 8.5*  HCT [id number]*   Basic Metabolic Panel  Recent Labs  02/09/15 0908 02/10/15 0429  NA 140 139  K 5.4* 4.6  CL 108 107  CO2  23 25  GLUCOSE 146* 106*  BUN 58* 62*  CREATININE 3.03* 2.91*  CALCIUM 8.3* 8.6*   Liver Function Tests No results for input(s): AST, ALT, ALKPHOS, BILITOT, PROT, ALBUMIN in the last 72 hours. No results for input(s): LIPASE, AMYLASE in the last 72 hours. Cardiac Enzymes No results for input(s): CKTOTAL, CKMB, CKMBINDEX, TROPONINI in the last 72 hours. BNP Invalid input(s): POCBNP D-Dimer No results for input(s): DDIMER in the last 72 hours. Hemoglobin A1C No results for input(s): HGBA1C in the last 72 hours. Fasting Lipid Panel No results for input(s): CHOL, HDL, LDLCALC, TRIG, CHOLHDL, LDLDIRECT in the last 72 hours. Thyroid Function Tests No results for input(s): TSH, T4TOTAL, T3FREE, THYROIDAB in the last 72 hours.  Invalid input(s): FREET3  TELE  NSR with BiV pacing  Radiology/Studies  Dg Chest 2 View  02/09/2015   CLINICAL DATA:  Arm discomfort status post pacemaker placement, weakness.  EXAM: CHEST  2 VIEW  COMPARISON:  Chest x-ray of Feb 08, 2015  FINDINGS: The lungs are well-expanded. The interstitial markings remain increased bilaterally. The central pulmonary vascularity is engorged. The left hemidiaphragm is less well demonstrated today.  The cardiac silhouette is enlarged. The permanent pacemaker defibrillator is unchanged in position. Aortic and mitral valve rings are visible. The right internal jugular venous catheter tip projects over the proximal SVC.  IMPRESSION: Mild CHF superimposed upon COPD. Increased density at the left lung base is consistent with atelectasis or small pleural effusion.   Electronically Signed   By: David  Martinique M.D.   On: 02/09/2015 07:54   Dg Chest Port 1 View  02/08/2015   CLINICAL DATA:  Pacemaker revision, central line placement  EXAM: PORTABLE CHEST - 1 VIEW  COMPARISON:  04/05/2014  FINDINGS: There is stable cardiomegaly. There is a 3 lead AICD. There is a right jugular central venous catheter with the tip projecting over the SVC. There  is no pleural effusion, pneumothorax or focal consolidation. There is central vascular prominence. The osseous structures are unremarkable.  IMPRESSION: Stable cardiomegaly.  No CHF.   Electronically Signed   By: Kathreen Devoid   On: 02/08/2015 18:36    ASSESSMENT AND PLAN  1. Chronic systolic heart failure 2. Complete heart block 3. S/p PM lead extraction and insertion of a new LV pacing lead 4. Acute on chronic renal failure 5. Debilitation Rec: Will continue IV lasix today. Check CXR. Remove bandage. Ambulate in hall with assist. Adjust coumadin dose. DC to SNF tomorrow. Patient requests [facility]  Gregg Taylor,M.D.  Gregg Taylor,M.D.  02/10/2015 7:15 AM

## 2015-02-10 NOTE — Progress Notes (Signed)
Pt ambulated with 1x assist with walker today, to the door and back to bed.

## 2015-02-10 NOTE — Discharge Summary (Signed)
ELECTROPHYSIOLOGY PROCEDURE DISCHARGE SUMMARY    Patient ID: Max Waren,  MRN: [id number], DOB/AGE: March 11, 1932 81 y.o.  Admit date: 02/08/2015 Discharge date: 02/11/2015  Primary Care Physician: [name], MD Primary Cardiologist: [place] Electrophysiologist: Lovena Le  Primary Discharge Diagnosis:  1.  Chronic systolic heart failure s/p RV lead extraction with LV lead implantation and CRTD implantation this admission 2.  Acute on chronic renal insufficiency 3.  Deconditioning  Secondary Discharge Diagnosis:  1.  NICM 2.  Valvular heart disease s/p AVR/MVR 3.  Complete heart block 4.  Hyperlipidemia 5.  CKD 6.  Diabetes 7.  HTN 8.  Obesity 9.  GERD 10.  DJD 11.  COPD 88.  Thyroid cancer s/p thyroidectomy  Allergies  Allergen Reactions  . Aspirin Other (See Comments)    On coumadin  . Fentanyl Dermatitis    Rash with patch   . Niacin Itching  . Penicillins Other (See Comments)    Bruises     Procedures This Admission:  1.  Extraction of a previously implanted RV lead and upgrade to a Medtronic CRTD on 02/08/15 by Dr Lovena Le.  See op note for full details. There was a moderate amount of bleeding and the patient was transfused 1 unit of PRBC by anesthesia post procedure.  2.  CXR on 02/09/15 demonstrated no pneumothorax status post device implantation.   Brief HPI: [name] [name] is a 81 y.o. female with a past medical history as above.  She has complete heart block and underwent failed CRT upgrade several weeks ago.  Due to heart failure symptoms, RV lead extraction (of previously abandoned RV lead) was recommended with upgrade to CRTD. Risks, benefits were reviewed with the patient who wished to proceed.   Hospital Course:  The patient was admitted and underwent upgrade to a Medtronic CRTD with details as outlined above. She was monitored on telemetry overnight which demonstrated sinus rhythm with ventricular pacing.  Post op course was complicated by  acute on chronic renal insufficiency and acute on chronic systolic heart failure. She was diuresed with improvement.  She was evaluated by PT who recommended SNF at discharge.  She will be discharged to the [facility] for [facility]hab.  Left chest was without hematoma or ecchymosis.  The device was interrogated and found to be functioning normally.  CXR was obtained and demonstrated no pneumothorax status post device implantation.  Wound care, arm mobility, and restrictions were reviewed with the patient.  The patient was examined and considered stable for discharge to home.   The patient's discharge medications include a beta blocker (metoprolol).  ARB was discontinued in the setting of acute on chronic renal insufficiency.   Physical Exam: Filed Vitals:   02/10/15 1430 02/10/15 2047 02/10/15 2123 02/11/15 0500  BP: 126/45 159/69  137/57  Pulse: 75 82  72  Temp:  99.1 F (37.3 C)  98.7 F (37.1 C)  TempSrc:  Oral  Oral  Resp: 16 18  18   Height:      Weight:    229 lb 6.4 oz (104.055 kg)  SpO2:  92% 96% 98%    GEN- The patient is well appearing, alert and oriented x 3 today.   HEENT: normocephalic, atraumatic; sclera clear, conjunctiva pink; hearing intact; oropharynx clear; neck supple, no JVP Lymph- no cervical lymphadenopathy Lungs- Clear to ausculation bilaterally, normal work of breathing.  No wheezes, rales, rhonchi Heart- Regular rate and rhythm (paced) GI- soft, non-tender, non-distended, bowel sounds present  Extremities- no clubbing, cyanosis, or  edema  MS- no significant deformity or atrophy Skin- warm and dry, no rash or lesion, left chest +hematoma Psych- euthymic mood, full affect Neuro- strength and sensation are intact   Labs:   Lab Results  Component Value Date   WBC 6.2 02/04/2015   HGB 8.5* 02/08/2015   HCT 25.0* 02/08/2015   MCV 94.5 02/04/2015   PLT 237 02/04/2015     Recent Labs Lab 02/10/15 0429  NA 139  K 4.6  CL 107  CO2 25  BUN 62*  CREATININE  2.91*  CALCIUM 8.6*  GLUCOSE 106*    Discharge Medications:    Medication List    STOP taking these medications        LANOXIN 62.5 MCG Tabs  Generic drug:  Digoxin     losartan 50 MG tablet  Commonly known as:  COZAAR      TAKE these medications        acetaminophen 500 MG tablet  Commonly known as:  TYLENOL  Take 500 mg by mouth every 6 (six) hours as needed (pain).     albuterol 108 (90 BASE) MCG/ACT inhaler  Commonly known as:  PROVENTIL HFA;VENTOLIN HFA  Inhale 2 puffs into the lungs every 6 (six) hours as needed for wheezing or shortness of breath.     albuterol (2.5 MG/3ML) 0.083% nebulizer solution  Commonly known as:  PROVENTIL  INHALE 1 VIAL VIA NEBULIZER THREE TIMES DAILY.     allopurinol 100 MG tablet  Commonly known as:  ZYLOPRIM  take 1 tablet by mouth once daily     cholecalciferol 1000 UNITS tablet  Commonly known as:  VITAMIN D  Take 1,000 Units by mouth daily.     Choline Fenofibrate 135 MG capsule  Commonly known as:  TRILIPIX  Take 1 capsule (135 mg total) by mouth daily.     clindamycin 300 MG capsule  Commonly known as:  CLEOCIN  Take 600 mg by mouth See admin instructions. Take 2 capsules (600 mg) one hour prior to dental appointment     docusate sodium 100 MG capsule  Commonly known as:  COLACE  Take 300 mg by mouth at bedtime.     epoetin alfa 40000 UNIT/ML injection  Commonly known as:  EPOGEN,PROCRIT  Inject 40,000 Units into the skin once.     ESTER-C PO  Take 1 tablet by mouth daily as needed (for immune system boost).     fluticasone 50 MCG/ACT nasal spray  Commonly known as:  FLONASE  instill 2 sprays into each nostril once daily     Fluticasone-Salmeterol 100-50 MCG/DOSE Aepb  Commonly known as:  ADVAIR DISKUS  inhale 1 dose by mouth twice a day     folic acid 1 MG tablet  Commonly known as:  FOLVITE  take 1 tablet by mouth once daily     furosemide 40 MG tablet  Commonly known as:  LASIX  Take 1 tablet (40 mg  total) by mouth daily.     gabapentin 300 MG capsule  Commonly known as:  NEURONTIN  Take 1 capsule (300 mg total) by mouth at bedtime.     hydrALAZINE 25 MG tablet  Commonly known as:  APRESOLINE  Take 1 tablet (25 mg total) by mouth 2 (two) times daily.     HYDROcodone-acetaminophen 7.5-325 MG per tablet  Commonly known as:  NORCO  Take 0.5-1 tablets by mouth 2 (two) times daily as needed (pain).     hydrOXYzine 10 MG tablet  Commonly known as:  ATARAX/VISTARIL  Take 1 tablet (10 mg total) by mouth 3 (three) times daily as needed.     isosorbide dinitrate 40 MG CR tablet  Commonly known as:  ISOCHRON  Take 1/2 tablet (20mg  total) twice a day.     KLOR-CON M20 20 MEQ tablet  Generic drug:  potassium chloride SA  Take 1 tablet by mouth  daily     meclizine 12.5 MG tablet  Commonly known as:  ANTIVERT  Take 1 tablet (12.5 mg total) by mouth 2 (two) times daily as needed for dizziness.     metolazone 2.5 MG tablet  Commonly known as:  ZAROXOLYN  Take 2.5 mg by mouth once a week. On Wednesday     metoprolol succinate 50 MG 24 hr tablet  Commonly known as:  TOPROL-XL  Take 100 mg by mouth daily at 6 PM. Take with or immediately following a meal.     MIRALAX powder  Generic drug:  polyethylene glycol powder  Take 17 g by mouth daily as needed for mild constipation.     multivitamin with minerals Tabs tablet  Take 1 tablet by mouth daily. Centrum Silver     ondansetron 4 MG tablet  Commonly known as:  ZOFRAN  Take 4 mg by mouth daily as needed for nausea or vomiting.     SALONPAS Pads  Apply 1 each topically daily as needed (pain).     SYNTHROID 75 MCG tablet  Generic drug:  levothyroxine  Take 1 tablet by mouth  daily     SYSTANE OP  Place 1 drop into both eyes daily as needed (dry eyes).     temazepam 30 MG capsule  Commonly known as:  RESTORIL  take 1 capsule at bedtime if needed     VYTORIN 10-20 MG per tablet  Generic drug:  ezetimibe-simvastatin  Take  1 tablet by mouth 3  times weekly     warfarin 5 MG tablet  Commonly known as:  COUMADIN  Take as directed by coumadin clinic        Disposition:  Discharge Instructions    Diet - low sodium heart healthy    Complete by:  As directed      Increase activity slowly    Complete by:  As directed           Follow-up Information    Follow up with [facility] On 02/18/2015.   Why:  at [place] information:   [address] [place] [phone]       Follow up with Sanda Klein, MD On 03/15/2015.   Specialty:  Cardiology   Why:  at [facility] information:   [address] [place] [address] [phone]       Follow up with [facility] On 02/15/2015.   Why:  at 1:50PM for coumadin check   Contact information:   [address] [place] [phone]       Duration of Discharge Encounter: Greater than 30 minutes including physician time.  Signed, Chanetta Marshall, NP 02/11/2015 7:52 AM   EP Attending  Patient seen and examined. Agree with the findings as noted above.  She is stable for discharge and will need early followup due to her multiple comorbidities  Cristopher Peru, M.D.

## 2015-02-10 NOTE — Progress Notes (Signed)
ANTICOAGULATION CONSULT NOTE - Follow-up Consult  Pharmacy Consult for Coumadin Indication: Mechanical valve  Allergies  Allergen Reactions  . Aspirin Other (See Comments)    On coumadin  . Fentanyl Dermatitis    Rash with patch   . Niacin Itching  . Penicillins Other (See Comments)    Bruises     Patient Measurements: Height: 5\' 7"  (170.2 cm) Weight: 240 lb 3.2 oz (108.954 kg) IBW/kg (Calculated) : 61.6 Heparin Dosing Weight:   Vital Signs: Temp: 98.3 F (36.8 C) (05/18 0454) Temp Source: Oral (05/18 0454) BP: 129/61 mmHg (05/18 0935) Pulse Rate: 69 (05/18 0454)  Labs:  Recent Labs  02/08/15 1059 02/08/15 1655 02/09/15 0020 02/09/15 0908 02/09/15 1010 02/10/15 0429  HGB  --  8.5*  --   --   --   --   HCT  --  25.0*  --   --   --   --   LABPROT 24.6*  --  24.2*  --  23.7*  --   INR 2.19*  --  2.15*  --  2.10*  --   CREATININE 2.50*  --   --  3.03*  --  2.91*    Estimated Creatinine Clearance: 19.6 mL/min (by C-G formula based on Cr of 2.91).      Assessment: sHF, CHB 81 y/o F presents for extraction of a RV ICD lead and insertion of a new LV pacing lead, removal of the old DDD ICD and insertion of a BiV ICD. EF 25%. Patient has a h/o valvular heart disease s/p AVR/MVR on Coumadin PTA.  Anticoag: AVR/MVR on chronic Coumadin (5mg  TThSat, 7.5mg  MWFSun). Patient says last dose of Coumadin (per instructions) was Fri 5/13. Admit INR 2.19 on 5/16. No INR/labs today.   Goal of Therapy:  INR 2.5-3.5 per Coumadin clinic note Monitor platelets by anticoagulation protocol: Yes   Plan:  Coumadin 5mg  po x 1 tonight. Daily INR  Levester Fresh, PharmD, BCPS Clinical Pharmacist [phone] 5/18/20161:41 PM

## 2015-02-10 NOTE — Clinical Social Work Note (Signed)
Clinical Social Work Assessment  Patient Details  Name: [name] [name] MRN: [id number] Date of Birth: [birth year]  Date of referral:  02/10/15               Reason for consult:  Discharge Planning                Permission sought to share information with:  Family Supports, Chartered certified accountant granted to share information::  Yes, Verbal Permission Granted  Name::        Agency::     Relationship::     Contact Information:     Housing/Transportation Living arrangements for the past 2 months:  Single Family Home Source of Information:  Patient Patient Interpreter Needed:  None Criminal Activity/Legal Involvement Pertinent to Current Situation/Hospitalization:  No - Comment as needed Significant Relationships:  Siblings Lives with:  Siblings Do you feel safe going back to the place where you live?  Yes Need for family participation in patient care:  No (Coment)  Care giving concerns:  Patient doesn't feel she will be able to manage at home at this time as she lives with her brother and he is handicapped.   Social Worker assessment / plan:  Patient plans to DC to SNF once medically stable. CSW explained SNF search/placement process and answered patient's questions. Patient reports that she has been to [facility] in the past but would like to DC to [place] if at all possible. Patient reports that she was admitted from home where she lives with her brother. Patient is NOT agreeable to [facility] [place]. CSW will follow up with bed offers.  Employment status:  Retired Forensic scientist:  Medicare PT Recommendations:  [place] / Referral to community resources:  [place]  Patient/Family's Response to care:  Patient agreeable to plan for DC to SNF.  Patient/Family's Understanding of and Emotional Response to Diagnosis, Current Treatment, and Prognosis:  Patient appears to have fair insight into reason for  admission and understands what her post discharge needs will be.  Emotional Assessment Appearance:  Appears stated age Attitude/Demeanor/Rapport:  Other (Appropriate) Affect (typically observed):  Accepting, Happy, Calm, Stable Orientation:  Oriented to Self, Oriented to Place, Oriented to  Time, Oriented to Situation Alcohol / Substance use:  Tobacco Use (Hx of) Psych involvement (Current and /or in the community):  No (Comment)  Discharge Needs  Concerns to be addressed:  Discharge Planning Concerns Readmission within the last 30 days:  No Current discharge risk:  Physical Impairment Barriers to Discharge:  Continued Medical Work up   Rigoberto Noel, LCSW 02/10/2015, 4:10 PM

## 2015-02-11 ENCOUNTER — Other Ambulatory Visit: Payer: Self-pay | Admitting: Physician Assistant

## 2015-02-11 DIAGNOSIS — E119 Type 2 diabetes mellitus without complications: Secondary | ICD-10-CM

## 2015-02-11 DIAGNOSIS — I5023 Acute on chronic systolic (congestive) heart failure: Secondary | ICD-10-CM

## 2015-02-11 LAB — BASIC METABOLIC PANEL
Anion gap: 10 (ref 5–15)
BUN: 52 mg/dL — ABNORMAL HIGH (ref 6–20)
CO2: 25 mmol/L (ref 22–32)
Calcium: 9.1 mg/dL (ref 8.9–10.3)
Chloride: 105 mmol/L (ref 101–111)
Creatinine, Ser: 2.27 mg/dL — ABNORMAL HIGH (ref 0.44–1.00)
GFR calc Af Amer: 22 mL/min — ABNORMAL LOW (ref >60)
GFR calc non Af Amer: 19 mL/min — ABNORMAL LOW (ref >60)
Glucose, Bld: 129 mg/dL — ABNORMAL HIGH (ref 65–99)
Potassium: 4.5 mmol/L (ref 3.5–5.1)
Sodium: 140 mmol/L (ref 135–145)

## 2015-02-11 LAB — GLUCOSE, CAPILLARY
Glucose-Capillary: 88 mg/dL (ref 65–99)
Glucose-Capillary: 109 mg/dL — ABNORMAL HIGH (ref 65–99)
Glucose-Capillary: 104 mg/dL — ABNORMAL HIGH (ref 65–99)

## 2015-02-11 LAB — PROTIME-INR
INR: 2.51 — ABNORMAL HIGH (ref 0.00–1.49)
Prothrombin Time: 27.3 seconds — ABNORMAL HIGH (ref 11.6–15.2)

## 2015-02-11 IMAGING — CR DG CHEST 2V
2 series · 2 of 2 positions shown · non-contrast
Comparison: PA and lateral chest 01/06/2014 in 03/29/2012.

CLINICAL DATA: Lower extremity pain.  Shortness of breath.

EXAM:
CHEST  2 VIEW

[view not recorded (1 of 2)]
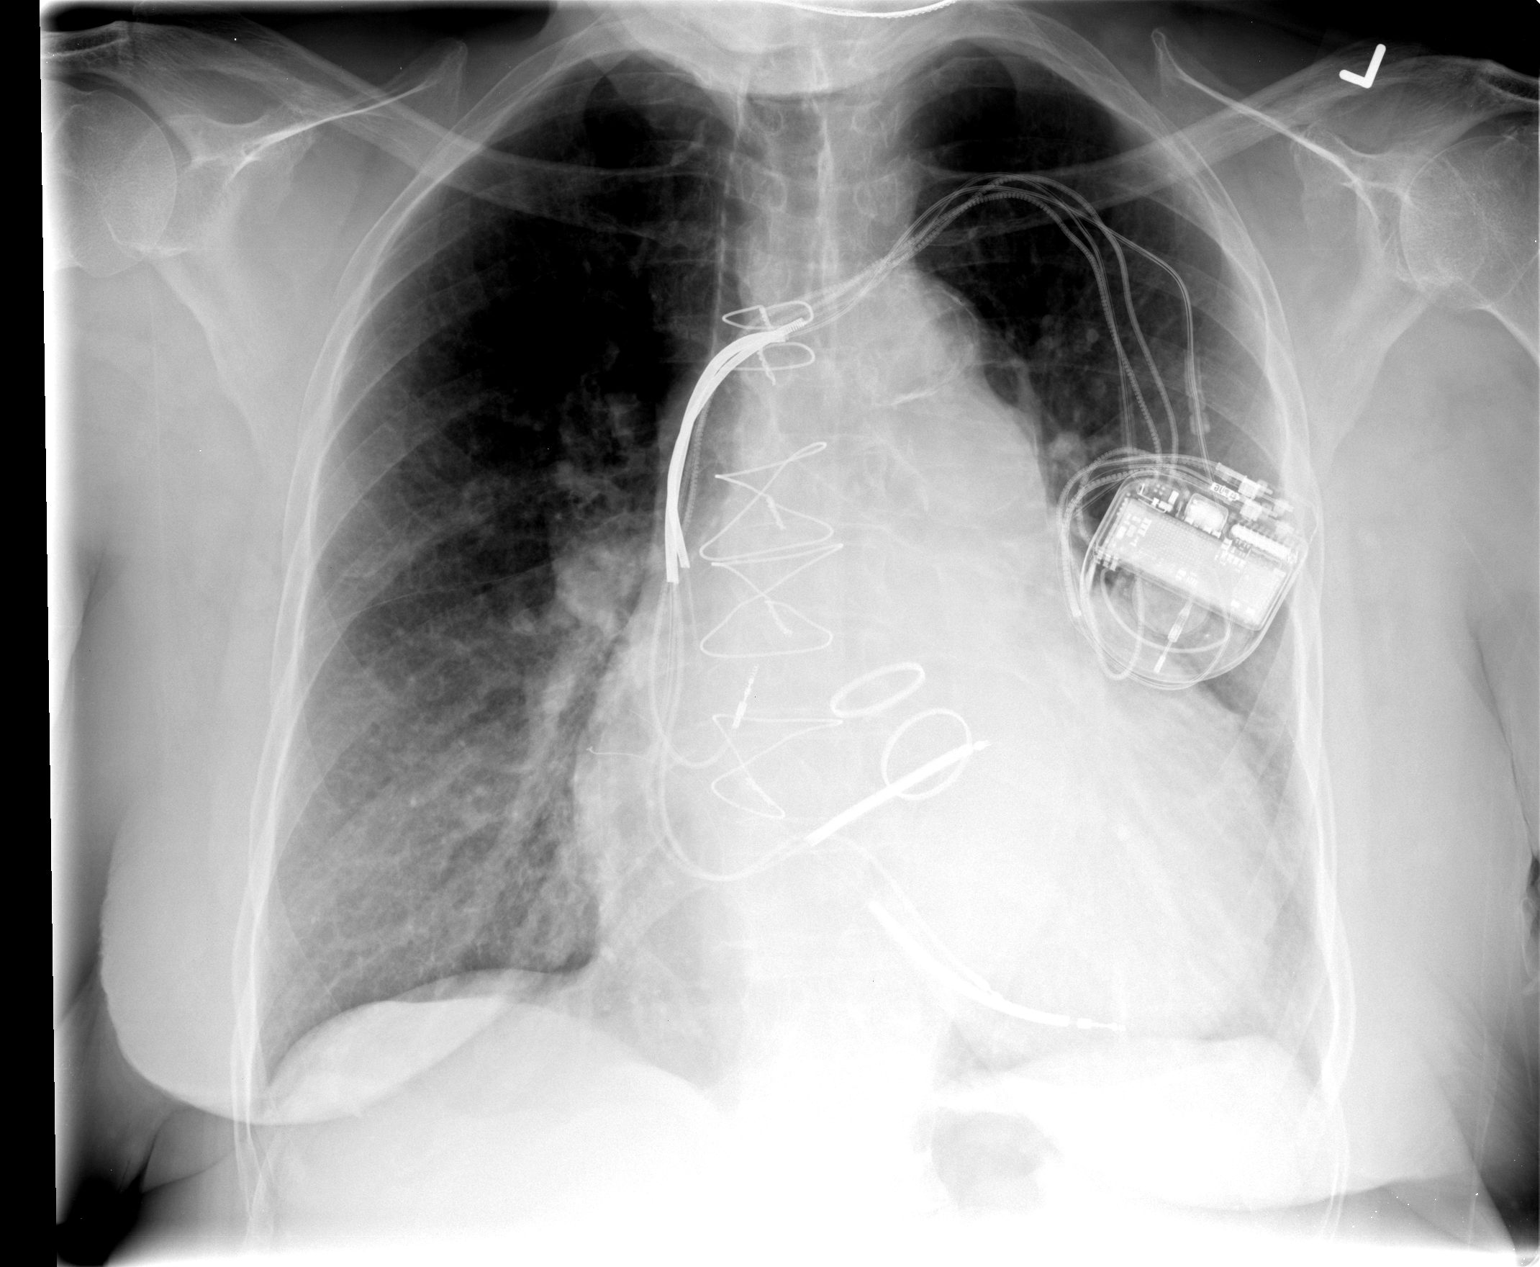

[view not recorded (2 of 2)]
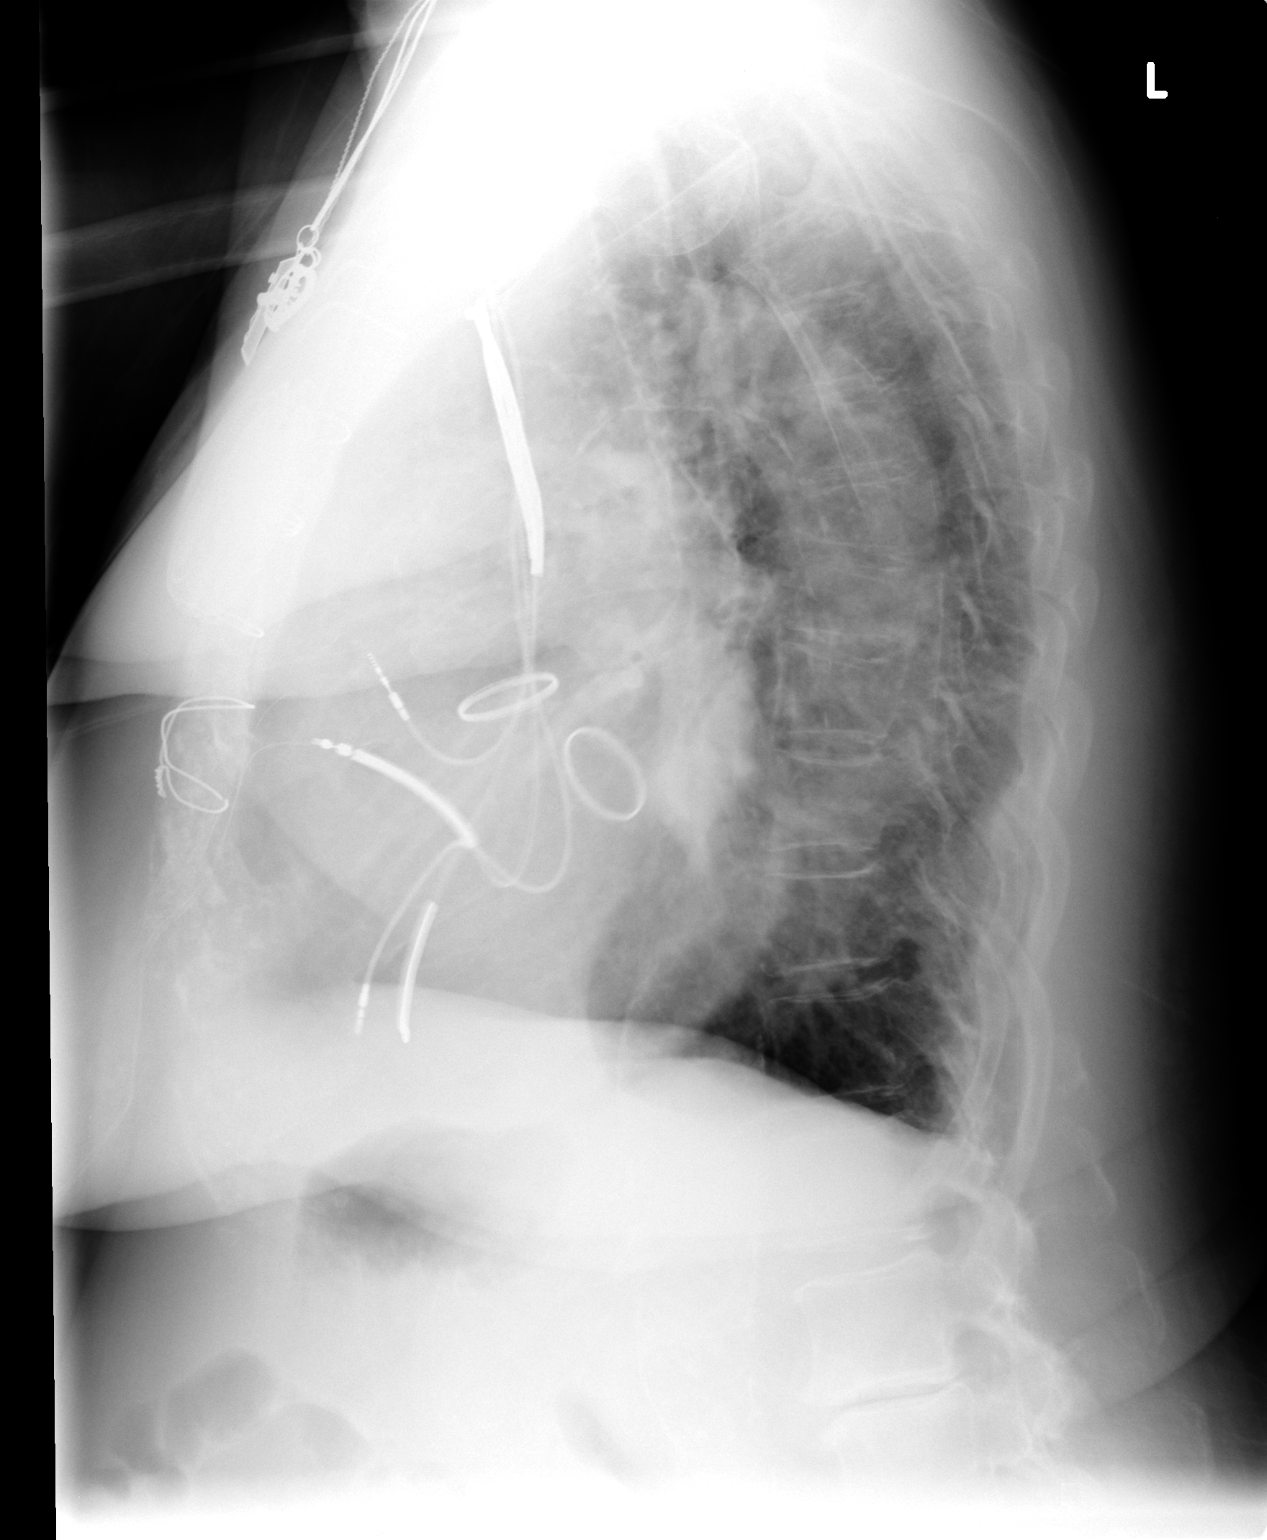

[2 of 2 positions shown; findings below may reference images not displayed]

FINDINGS: AICD is again seen. The patient is status post aortic and mitral
valve replacement. There is marked cardiomegaly. No pulmonary edema.
No consolidative process, pneumothorax or effusion. Remote, mild mid
thoracic compression fracture is unchanged.
IMPRESSION: Cardiomegaly without acute disease.

## 2015-02-11 MED ORDER — FUROSEMIDE 40 MG PO TABS
40.0000 mg | ORAL_TABLET | Freq: Every day | ORAL | Status: DC
  Administered 2015-02-11: 40 mg via ORAL
  Filled 2015-02-11: qty 1

## 2015-02-11 MED ORDER — WARFARIN SODIUM 7.5 MG PO TABS: 7.5000 mg | ORAL_TABLET | Freq: Once | ORAL | Status: DC

## 2015-02-11 NOTE — Progress Notes (Signed)
Physical Therapy Treatment Patient Details Name: [name] [name] MRN: [id number] DOB: 1931-12-13 Today's Date: 03/13/2015    History of Present Illness Patient is an 81 y.o. female admitted with h/o complete heart block for ICD exchange.  PMH also positive for DJD, GERD, HLD, HTN, cardiomyopathy, DM, COPD, and CHF.    PT Comments    Patient progressing with improved transfers and ambulation, but still limited in activity tolerance and with generalized weakness.  Continue to recommend SNF rehab prior to d/c home.  Follow Up Recommendations  SNF     Equipment Recommendations  None recommended by PT    Recommendations for Other Services       Precautions / Restrictions Precautions Precautions: Fall;ICD/Pacemaker    Mobility  Bed Mobility               General bed mobility comments: NT, up on EOB with nursing staff  Transfers Overall transfer level: Needs assistance Equipment used: Straight cane Transfers: Sit to/from Stand Sit to Stand: Min assist         General transfer comment: assist for balance from EOB, stood from toilet unaided  Ambulation/Gait Ambulation/Gait assistance: Min assist Ambulation Distance (Feet): 50 Feet Assistive device: Straight cane Gait Pattern/deviations: Step-through pattern;Trunk flexed;Decreased stride length;Wide base of support     General Gait Details: sequencing well with cane, c/o back pain and noted flexed posture throughout.  c/o fatigue and noted SOB   Stairs            Wheelchair Mobility    Modified Rankin (Stroke Patients Only)       Balance Overall balance assessment: Needs assistance         Standing balance support: Single extremity supported Standing balance-Leahy Scale: Fair Standing balance comment: stands at sink without UE support to wash hands                     Cognition Arousal/Alertness: Awake/alert Behavior During Therapy: WFL for tasks assessed/performed Overall Cognitive  Status: Within Functional Limits for tasks assessed                      Exercises      General Comments        Pertinent Vitals/Pain Faces Pain Scale: Hurts little more Pain Location: left side chest after dressing removal Pain Descriptors / Indicators: Sore Pain Intervention(s): Monitored during session    Home Living                      Prior Function            PT Goals (current goals can now be found in the care plan section) Progress towards PT goals: Progressing toward goals    Frequency  Min 3X/week    PT Plan Current plan remains appropriate    Co-evaluation             End of Session Equipment Utilized During Treatment: Gait belt Activity Tolerance: Patient limited by fatigue Patient left: in chair;with call bell/phone within reach     Time: 1205-1228 PT Time Calculation (min) (ACUTE ONLY): 23 min  Charges:  $Gait Training: 8-22 mins $Therapeutic Activity: 8-22 mins                    G Codes:      [name] [name],[name] [name] Mar 13, 2015, 1:22 PM  [place], [place] 03/13/15

## 2015-02-11 NOTE — Clinical Social Work Note (Signed)
Clinical Social Worker continuing to follow patient for support and discharge planning needs.  Patient received a bed at [facility], however she was not able to have a 3 night qualifying stay with Medicare to approve a SNF stay.  Patient provided with the option to pay privately, however that is not financial feasible at this time.  CSW updated [facility] on patient plans to return home and CM arranged home health services.  Patient to discharge home today.    Clinical Social Worker will sign off for now as social work intervention is no longer needed. Please consult [place] again if new need arises.  Barbette Or, [place]

## 2015-02-11 NOTE — Progress Notes (Addendum)
ANTICOAGULATION CONSULT NOTE - Follow-up Consult  Pharmacy Consult for Coumadin Indication: Mechanical valve  Allergies  Allergen Reactions  . Aspirin Other (See Comments)    On coumadin  . Fentanyl Dermatitis    Rash with patch   . Niacin Itching  . Penicillins Other (See Comments)    Bruises     Patient Measurements: Height: 5\' 7"  (170.2 cm) Weight: 229 lb 6.4 oz (104.055 kg) IBW/kg (Calculated) : 61.6 Heparin Dosing Weight:   Vital Signs: Temp: 98.7 F (37.1 C) (05/19 0500) Temp Source: Oral (05/19 0500) BP: 111/43 mmHg (05/19 0854) Pulse Rate: 72 (05/19 0500)  Labs:  Recent Labs  02/08/15 1059 02/08/15 1655 02/09/15 0020 02/09/15 0908 02/09/15 1010 02/10/15 0429 02/11/15 0420  HGB  --  8.5*  --   --   --   --   --   HCT  --  25.0*  --   --   --   --   --   LABPROT 24.6*  --  24.2*  --  23.7*  --  27.3*  INR 2.19*  --  2.15*  --  2.10*  --  2.51*  CREATININE 2.50*  --   --  3.03*  --  2.91*  --     Estimated Creatinine Clearance: 19.1 mL/min (by C-G formula based on Cr of 2.91).      Assessment: sHF, CHB 81 y/o F presents for extraction of a RV ICD lead and insertion of a new LV pacing lead, removal of the old DDD ICD and insertion of a BiV ICD. EF 25%. Patient has a h/o valvular heart disease s/p AVR/MVR on Coumadin PTA.  Anticoag: AVR/MVR on chronic Coumadin (5mg  TThSat, 7.5mg  MWFSun). Patient says last dose of Coumadin (per instructions) was Fri 5/13. Admit INR 2.19 on 5/16. INR 2.51  Goal of Therapy:  INR 2.5-3.5 per Coumadin clinic note Monitor platelets by anticoagulation protocol: Yes   Plan:  Coumadin 7.5mg  po x 1 tonight. Daily INR  Levester Fresh, PharmD, BCPS Clinical Pharmacist [phone] 5/19/201610:39 AM

## 2015-02-11 NOTE — Progress Notes (Signed)
Heart Failure Navigator Consult Note  Presentation: Carrera Pengelly is a 81 y.o. female with a past history as noted below. She has complete heart block and underwent failed CRT upgrade several weeks ago. Due to heart failure symptoms, RV lead extraction (of previously abandoned RV lead) was recommended with upgrade to CRTD. Risks, benefits were reviewed with the patient who wished to proceed.    Past Medical History  Diagnosis Date  . DJD (degenerative joint disease) of lumbar spine   . Chronic kidney disease, stage I   . Esophageal reflux   . Other and unspecified hyperlipidemia   . Non-ischemic cardiomyopathy     a. s/p MDT CRTD  . Obesity, unspecified   . DM (diabetes mellitus)   . 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 IDA (iron deficiency anemia)     Parenteral iron/[name]  . Adenomatous polyp of colon 12/16/2002    Dr. [name]/[facility]  . Hyperlipemia   . Thyroid cancer     remote thyroidectomy, no recurrence  . COPD (chronic obstructive pulmonary disease)        . CHF (congestive heart failure)        . Constipation        . Gout        . CHB (complete heart block) 10/07/2014       . Insomnia        . Unspecified hypothyroidism        . Essential hypertension, benign        . Vertigo          History   Social History  . Marital Status: Widowed    Spouse Name: N/A  . Number of Children: 0  . Years of Education: N/A   Occupational History  . retired    Social History Main Topics  . Smoking status: Former Smoker -- 0.75 packs/day for 40 years    Types: Cigarettes    Quit date: 03/08/1987  . Smokeless tobacco: Never Used  . Alcohol Use: No  . Drug Use: No  . Sexual Activity: Not on file   Other Topics Concern  . None   Social History Narrative   From [facility] (2004)    ECHO: Study Conclusions--07/01/14 - Procedure narrative: HR 70 bpm range. - Left ventricle: Systolic  function is severely reduced, estimated EF 25-30%. Global hypokinesis noted. Diastolic dysfunction noted, indeterminate grade. The cavity size was normal. Wall thickness was increased in a pattern of moderate LVH. - Aortic valve: Normally functioning St. Jude mechanical valve. No dehiscence noted. There was no significant regurgitation. Peak velocity 2.85 m/s. Mean gradient 19 mmHg. Peak gradient (S): 30 mm Hg. - Mitral valve: Normally functioning St. Jude mechanical valve. No dehiscence noted. There was no significant regurgitation. Pressure half-time: 78 ms. Mean gradient (D): 9 mm Hg. Peak gradient (D): 12 mm Hg. Valve area by continuity equation (using LVOT flow): 0.7 cm^2. - Left atrium: The atrium was severely dilated. Volume/bsa, S: 62 ml/m^2. - Right ventricle: The cavity size was mildly dilated. Pacer wire or catheter noted in right ventricle. Systolic function was mildly reduced. - Right atrium: The atrium was moderately dilated. Pacer wire or catheter noted in right atrium. - Tricuspid valve: There was mild-moderate regurgitation. - Pulmonic valve: There was mild regurgitation. - Pulmonary arteries: PA peak pressure: 59 mm Hg (S). Moderately elevated pulmonary pressures. - Pericardium, extracardiac: A trivial pericardial effusion was identified.  ------------------------------------------------------------------- Labs, prior tests,  procedures, and surgery: AICD 2004. MVR-St Jude 2001, AVR-St Jude 2004. Transthoracic echocardiography. M-mode, complete 2D, spectral Doppler, and color Doppler. Birthdate: Patient birthdate: 1932-09-08. Age: Patient is 81 yr old. Sex: Gender: female. BMI: 37.6 kg/m^2. Blood pressure:   126/70 Patient status: Outpatient. Study date: Study date: 07/01/2014. Study time: 02:27 PM. Location: Echo laboratory. BNP No results found for: BNP  ProBNP    Component Value Date/Time   PROBNP 1713.0*  04/05/2014 1346     Education Assessment and Provision:  Detailed education and instructions provided on heart failure disease management including the following:  Signs and symptoms of Heart Failure When to call the physician Importance of daily weights Low sodium diet Fluid restriction Medication management Anticipated future follow-up appointments  Patient education given on each of the above topics.  Patient acknowledges understanding and acceptance of all instructions.  I spoke briefly with Ms. Barbarito about her HF and HF recomendations.  She plans to return home to live with her brother--who according to her is not in "great shape" either.  She says that she has a scale and does not weigh daily except sometimes immediately after leaving the hospital.  I reinforced the importance of daily weights and how to use them in relation to signs and symptoms of HF.  She says that she takes all medications as prescribed.  She also says she "tries" to eat low sodium.  I reviewed high sodium foods to avoid and a low sodium diet with her.  She tells me she sees Dr. Lovena Le as her cardiologist.    Education Materials:  "Living Better With Heart Failure" Booklet, Daily Weight Tracker Tool and Heart Failure Educational Video.   High Risk Criteria for Readmission and/or Poor Patient Outcomes:   EF <30%- Yes-25-30%  2 or more admissions in 6 months- No  Difficult social situation- No  Demonstrates medication noncompliance- No   Barriers of Care:  Knowledge, compliance and ?her ability to care for herself  Discharge Planning:   Plans to return home with brother.  She will have HHRN as well

## 2015-02-11 NOTE — Care Management Note (Signed)
Case Management Note  Patient Details  Name: Natayah Bojorquez MRN: [id number] Date of Birth: 1932/07/08  Subjective/Objective:   Pt without 3 day qualifying stay for SNF.                  Action/Plan: Plan for home today with [facility] services. CM did make referral for [facility] PT, RN and Aide. No further needs from CM at this time.    Expected Discharge Date:  02/09/15               Expected Discharge Plan:  [place]  In-House Referral:  Clinical Social Work  Discharge planning Services  CM Consult  Post Acute Care Choice:    Choice offered to:     DME Arranged:    DME Agency:  [place] Arranged:  RN, PT, Nurse's Aide [place] Agency:  [place]  Status of Service:  Completed, signed off  Medicare Important Message Given:  N/A - LOS <3 / Initial given by admissions Date Medicare IM Given:    Medicare IM give by:    Date Additional Medicare IM Given:    Additional Medicare Important Message give by:     If discussed at [place] of Stay Meetings, dates discussed:    Additional Comments:  Bethena Roys, RN 02/11/2015, 2:37 PM

## 2015-02-12 ENCOUNTER — Telehealth: Payer: Self-pay | Admitting: Family Medicine

## 2015-02-12 IMAGING — US US EXTREM LOW VENOUS BILAT
1 series · 13 of 24 positions shown · non-contrast
Comparison: None.

CLINICAL DATA: 82-year-old female with bilateral lower extremity
pain and swelling. Currently on Coumadin



[Series 1: us extrem low venous bilat · 0.08mm/px · 13 of 83 slices shown]
[im 1/83]
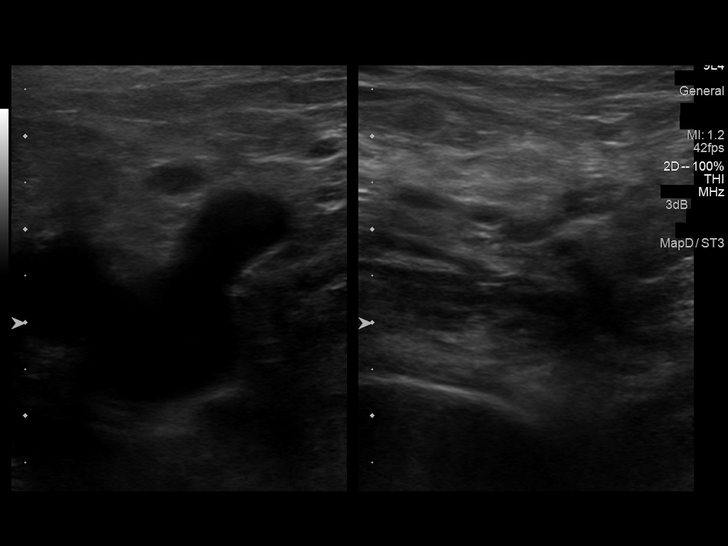
[im 8/83]
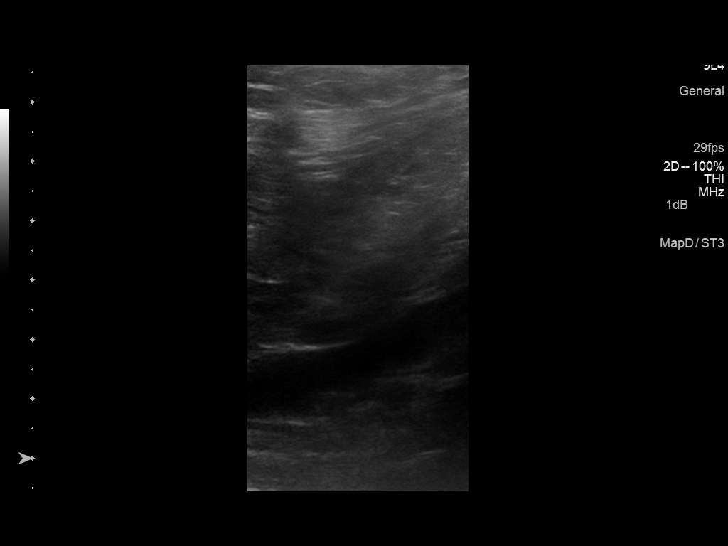
[im 15/83]
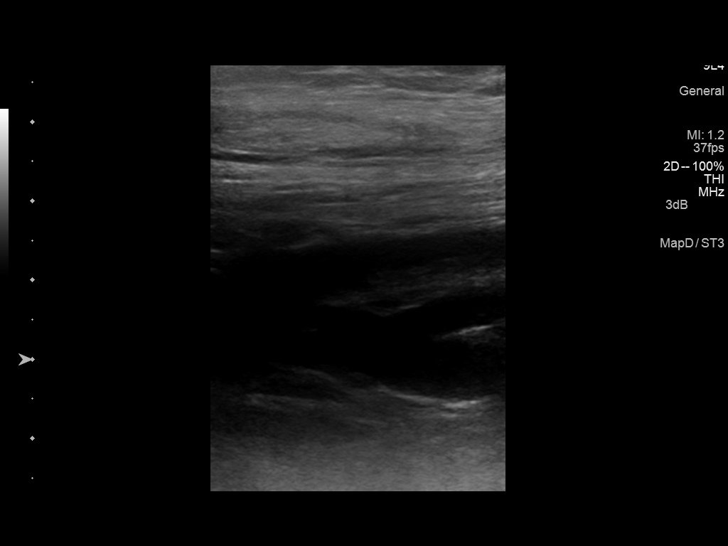
[im 22/83]
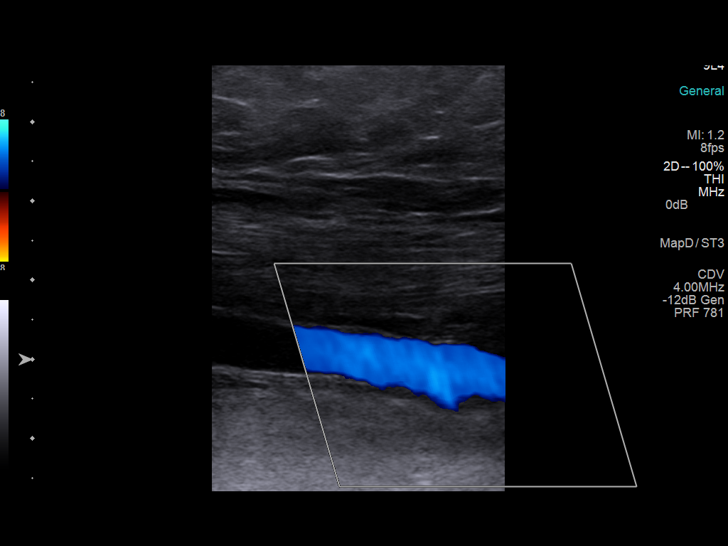
[im 29/83]
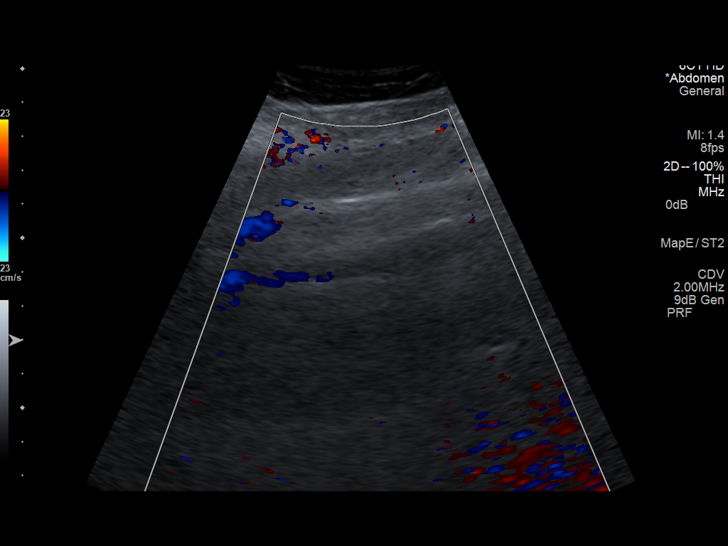
[im 36/83]
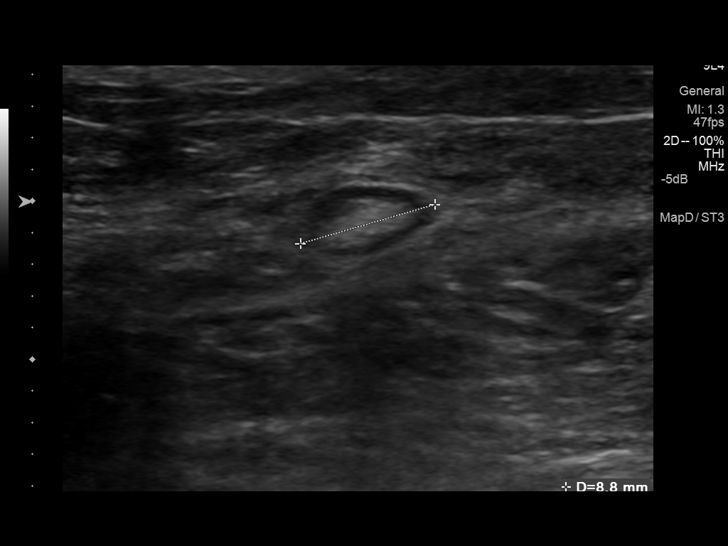
[im 43/83]
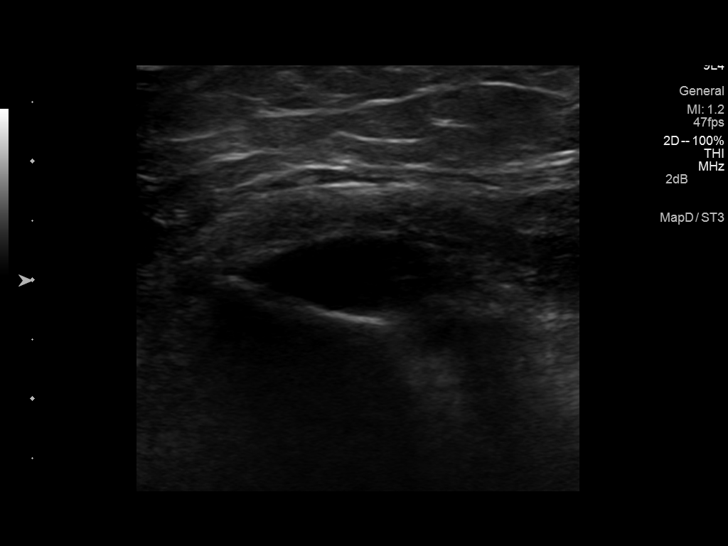
[im 47/83]
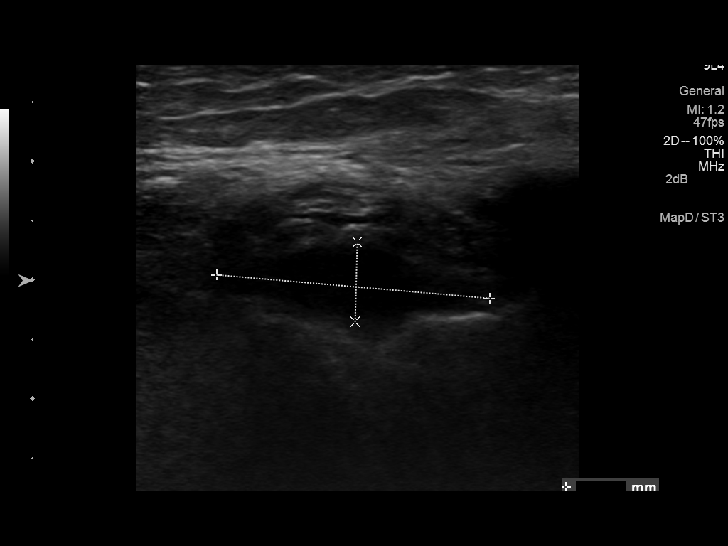
[im 54/83]
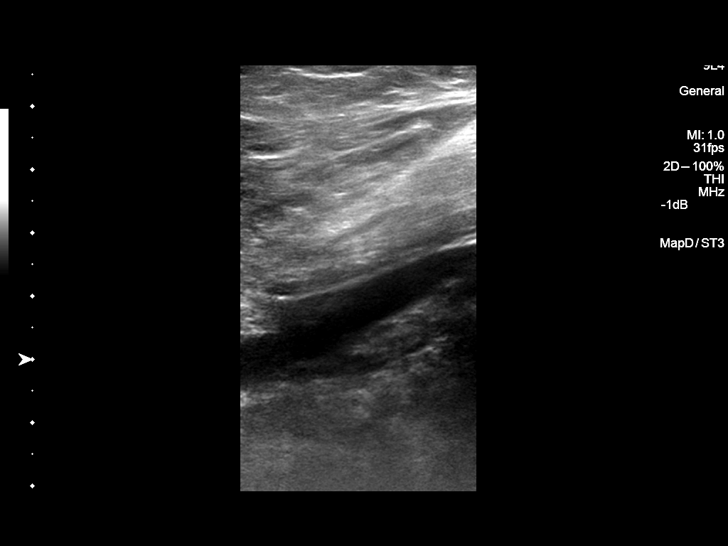
[im 61/83]
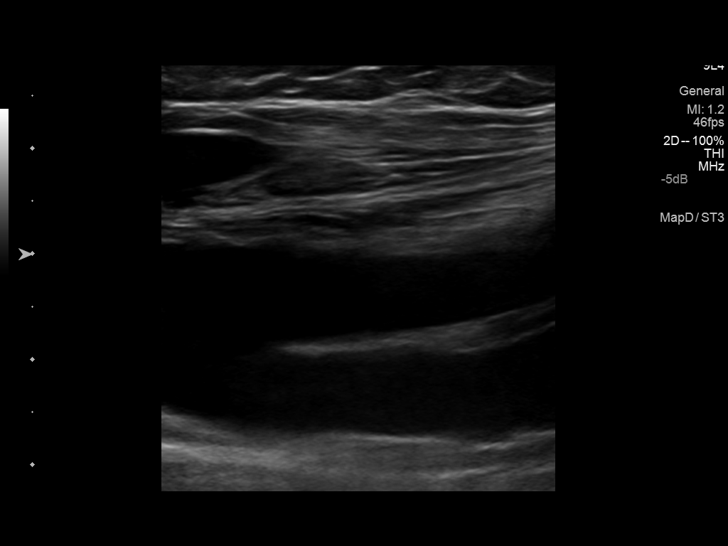
[im 68/83]
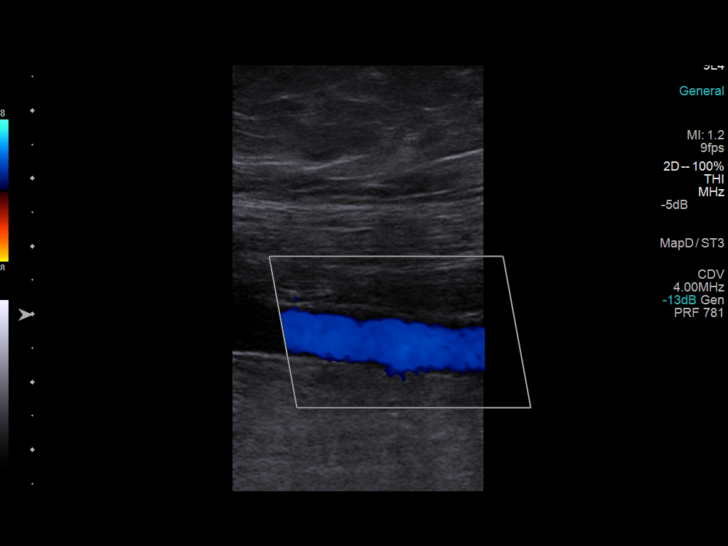
[im 75/83]
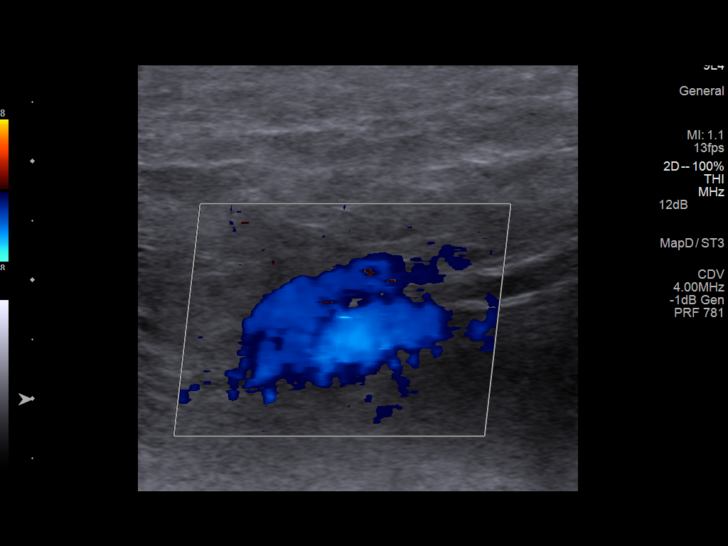
[im 83/83]
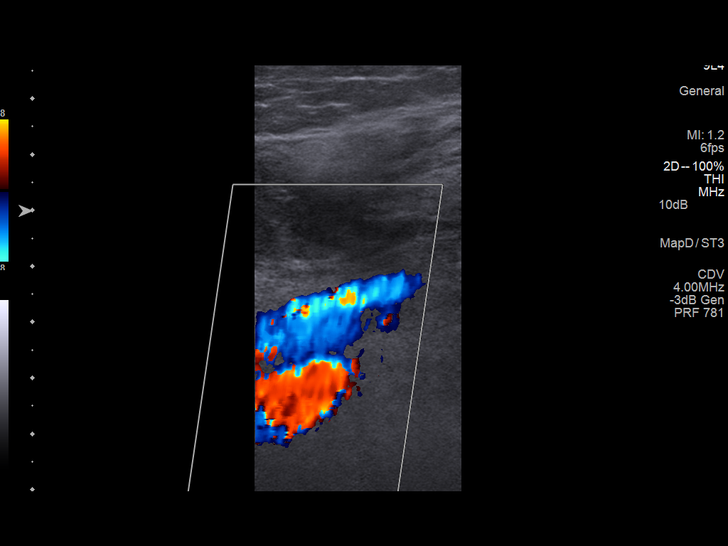

[13 of 24 positions shown; findings below may reference images not displayed]

FINDINGS: RIGHT LOWER EXTREMITY

Common Femoral Vein: No evidence of thrombus. Normal
compressibility, respiratory phasicity and response to augmentation.

Saphenofemoral Junction: No evidence of thrombus. Normal
compressibility and flow on color Doppler imaging.

Profunda Femoral Vein: No evidence of thrombus. Normal
compressibility and flow on color Doppler imaging.

Femoral Vein: No evidence of thrombus. Normal compressibility,
respiratory phasicity and response to augmentation.

Popliteal Vein: No evidence of thrombus. Normal compressibility,
respiratory phasicity and response to augmentation.

Calf Veins: No evidence of thrombus. Normal compressibility and flow
on color Doppler imaging.

Superficial Great Saphenous Vein: No evidence of thrombus. Normal
compressibility and flow on color Doppler imaging.

Venous Reflux:  None.

Other Findings: A 2.3 x 0.7 x 1.8 cm popliteal/ Baker's cyst is
present.

LEFT LOWER EXTREMITY

Common Femoral Vein: No evidence of thrombus. Normal
compressibility, respiratory phasicity and response to augmentation.

Saphenofemoral Junction: No evidence of thrombus. Normal
compressibility and flow on color Doppler imaging.

Profunda Femoral Vein: No evidence of thrombus. Normal
compressibility and flow on color Doppler imaging.

Femoral Vein: No evidence of thrombus. Normal compressibility,
respiratory phasicity and response to augmentation.

Popliteal Vein: No evidence of thrombus. Normal compressibility,
respiratory phasicity and response to augmentation.

Calf Veins: No evidence of thrombus. Normal compressibility and flow
on color Doppler imaging.

Superficial Great Saphenous Vein: No evidence of thrombus. Normal
compressibility and flow on color Doppler imaging.

Venous Reflux:  None.

Other Findings:  None.
IMPRESSION: No evidence of deep venous thrombosis.

2.3 x 0.7 x 1.8 cm right popliteal/Baker's cyst.

## 2015-02-12 MED ORDER — POTASSIUM CHLORIDE CRYS ER 20 MEQ PO TBCR: EXTENDED_RELEASE_TABLET | ORAL | Status: DC

## 2015-02-12 NOTE — Telephone Encounter (Signed)
Refill sent.

## 2015-02-14 DIAGNOSIS — E119 Type 2 diabetes mellitus without complications: Secondary | ICD-10-CM | POA: Diagnosis not present

## 2015-02-14 DIAGNOSIS — N181 Chronic kidney disease, stage 1: Secondary | ICD-10-CM | POA: Diagnosis not present

## 2015-02-14 DIAGNOSIS — Z48812 Encounter for surgical aftercare following surgery on the circulatory system: Secondary | ICD-10-CM | POA: Diagnosis not present

## 2015-02-14 DIAGNOSIS — J449 Chronic obstructive pulmonary disease, unspecified: Secondary | ICD-10-CM | POA: Diagnosis not present

## 2015-02-14 DIAGNOSIS — I129 Hypertensive chronic kidney disease with stage 1 through stage 4 chronic kidney disease, or unspecified chronic kidney disease: Secondary | ICD-10-CM | POA: Diagnosis not present

## 2015-02-14 DIAGNOSIS — I5020 Unspecified systolic (congestive) heart failure: Secondary | ICD-10-CM | POA: Diagnosis not present

## 2015-02-14 DIAGNOSIS — Z950 Presence of cardiac pacemaker: Secondary | ICD-10-CM | POA: Diagnosis not present

## 2015-02-14 DIAGNOSIS — E785 Hyperlipidemia, unspecified: Secondary | ICD-10-CM | POA: Diagnosis not present

## 2015-02-14 DIAGNOSIS — Z87891 Personal history of nicotine dependence: Secondary | ICD-10-CM | POA: Diagnosis not present

## 2015-02-14 DIAGNOSIS — I429 Cardiomyopathy, unspecified: Secondary | ICD-10-CM | POA: Diagnosis not present

## 2015-02-14 DIAGNOSIS — E669 Obesity, unspecified: Secondary | ICD-10-CM | POA: Diagnosis not present

## 2015-02-14 DIAGNOSIS — M47896 Other spondylosis, lumbar region: Secondary | ICD-10-CM | POA: Diagnosis not present

## 2015-02-15 ENCOUNTER — Ambulatory Visit (INDEPENDENT_AMBULATORY_CARE_PROVIDER_SITE_OTHER): Payer: Medicare Other | Admitting: *Deleted

## 2015-02-15 ENCOUNTER — Encounter (HOSPITAL_COMMUNITY): Payer: Self-pay | Admitting: Internal Medicine

## 2015-02-15 ENCOUNTER — Other Ambulatory Visit: Payer: Self-pay | Admitting: Family Medicine

## 2015-02-15 DIAGNOSIS — Z954 Presence of other heart-valve replacement: Secondary | ICD-10-CM | POA: Diagnosis not present

## 2015-02-15 DIAGNOSIS — Z5181 Encounter for therapeutic drug level monitoring: Secondary | ICD-10-CM | POA: Diagnosis not present

## 2015-02-15 DIAGNOSIS — Z7901 Long term (current) use of anticoagulants: Secondary | ICD-10-CM | POA: Diagnosis not present

## 2015-02-15 DIAGNOSIS — E119 Type 2 diabetes mellitus without complications: Secondary | ICD-10-CM | POA: Diagnosis not present

## 2015-02-15 DIAGNOSIS — Z952 Presence of prosthetic heart valve: Secondary | ICD-10-CM

## 2015-02-15 LAB — HEMOGLOBIN A1C
Hgb A1c MFr Bld: 6.1 % — ABNORMAL HIGH (ref <5.7)
Mean Plasma Glucose: 128 mg/dL — ABNORMAL HIGH (ref <117)

## 2015-02-15 LAB — POCT INR: INR: 2.1

## 2015-02-16 DIAGNOSIS — I129 Hypertensive chronic kidney disease with stage 1 through stage 4 chronic kidney disease, or unspecified chronic kidney disease: Secondary | ICD-10-CM | POA: Diagnosis not present

## 2015-02-16 DIAGNOSIS — N181 Chronic kidney disease, stage 1: Secondary | ICD-10-CM | POA: Diagnosis not present

## 2015-02-16 DIAGNOSIS — I5020 Unspecified systolic (congestive) heart failure: Secondary | ICD-10-CM | POA: Diagnosis not present

## 2015-02-16 DIAGNOSIS — E119 Type 2 diabetes mellitus without complications: Secondary | ICD-10-CM | POA: Diagnosis not present

## 2015-02-16 DIAGNOSIS — I429 Cardiomyopathy, unspecified: Secondary | ICD-10-CM | POA: Diagnosis not present

## 2015-02-16 DIAGNOSIS — Z48812 Encounter for surgical aftercare following surgery on the circulatory system: Secondary | ICD-10-CM | POA: Diagnosis not present

## 2015-02-16 LAB — COMPLETE METABOLIC PANEL WITH GFR
ALT: 12 U/L (ref 0–35)
AST: 26 U/L (ref 0–37)
Albumin: 3.6 g/dL (ref 3.5–5.2)
Alkaline Phosphatase: 36 U/L — ABNORMAL LOW (ref 39–117)
BUN: 54 mg/dL — ABNORMAL HIGH (ref 6–23)
CO2: 26 mEq/L (ref 19–32)
Calcium: 9.3 mg/dL (ref 8.4–10.5)
Chloride: 103 mEq/L (ref 96–112)
Creat: 2.34 mg/dL — ABNORMAL HIGH (ref 0.50–1.10)
GFR, Est African American: 22 mL/min — ABNORMAL LOW
GFR, Est Non African American: 19 mL/min — ABNORMAL LOW
Glucose, Bld: 92 mg/dL (ref 70–99)
Potassium: 4.7 mEq/L (ref 3.5–5.3)
Sodium: 142 mEq/L (ref 135–145)
Total Bilirubin: 0.7 mg/dL (ref 0.2–1.2)
Total Protein: 7.1 g/dL (ref 6.0–8.3)

## 2015-02-16 LAB — MICROALBUMIN / CREATININE URINE RATIO
Creatinine, Urine: 113.8 mg/dL
Microalb Creat Ratio: 19.3 mg/g (ref 0.0–30.0)
Microalb, Ur: 2.2 mg/dL — ABNORMAL HIGH (ref <2.0)

## 2015-02-16 LAB — TSH: TSH: 3.07 u[IU]/mL (ref 0.350–4.500)

## 2015-02-17 ENCOUNTER — Encounter: Payer: Medicare Other | Admitting: Family Medicine

## 2015-02-17 ENCOUNTER — Encounter: Payer: Self-pay | Admitting: Family Medicine

## 2015-02-17 ENCOUNTER — Ambulatory Visit (INDEPENDENT_AMBULATORY_CARE_PROVIDER_SITE_OTHER): Payer: Medicare Other | Admitting: Family Medicine

## 2015-02-17 VITALS — BP 124/60 | HR 83 | Resp 16 | Ht 67.0 in | Wt 224.4 lb

## 2015-02-17 DIAGNOSIS — Z23 Encounter for immunization: Secondary | ICD-10-CM | POA: Diagnosis not present

## 2015-02-17 DIAGNOSIS — J302 Other seasonal allergic rhinitis: Secondary | ICD-10-CM

## 2015-02-17 DIAGNOSIS — E1122 Type 2 diabetes mellitus with diabetic chronic kidney disease: Secondary | ICD-10-CM

## 2015-02-17 DIAGNOSIS — E890 Postprocedural hypothyroidism: Secondary | ICD-10-CM

## 2015-02-17 DIAGNOSIS — I10 Essential (primary) hypertension: Secondary | ICD-10-CM

## 2015-02-17 DIAGNOSIS — E559 Vitamin D deficiency, unspecified: Secondary | ICD-10-CM

## 2015-02-17 DIAGNOSIS — E790 Hyperuricemia without signs of inflammatory arthritis and tophaceous disease: Secondary | ICD-10-CM

## 2015-02-17 DIAGNOSIS — Z0000 Encounter for general adult medical examination without abnormal findings: Secondary | ICD-10-CM

## 2015-02-17 DIAGNOSIS — E785 Hyperlipidemia, unspecified: Secondary | ICD-10-CM

## 2015-02-17 DIAGNOSIS — E039 Hypothyroidism, unspecified: Secondary | ICD-10-CM

## 2015-02-17 DIAGNOSIS — N189 Chronic kidney disease, unspecified: Secondary | ICD-10-CM

## 2015-02-17 MED ORDER — MONTELUKAST SODIUM 10 MG PO TABS: 10.0000 mg | ORAL_TABLET | Freq: Every day | ORAL | Status: DC

## 2015-02-17 NOTE — Assessment & Plan Note (Addendum)
Surgical site from recent pacemaker placement still has steri strips in place, wound still open partially. TdAp is past due and is administered by lPN after informed consent

## 2015-02-17 NOTE — Patient Instructions (Signed)
Annual physical exam in 4.5 month, call if you need me before  TdAP today due to incision from recent surgery  Pls get zostavax when able  New for allergies is singulair  You are referred for eye exam end August  Please bring back stool cards for testing  Schedule mammogram when able and follow up with surgeon  TSH, HBA1C, fasting lipid, cmp and EGFR and uric acid level and vit D in 4.5 month  Thanks for choosing [facility], we consider it a privelige to serve you.

## 2015-02-17 NOTE — Progress Notes (Signed)
Subjective:    Patient ID: [name] [name], female    DOB: 05/13/1932, 81 y.o.   MRN: [id number]  HPI Preventive Screening-Counseling & Management   Patient present here today for a Medicare annual wellness visit.   Current Problems (verified)   Medications Prior to Visit Allergies (verified)   PAST HISTORY  Family History (verified)   Social History Widow, no biological children but raised 2 children after their mother passed. Lives with her brother currently    Risk Factors  Current exercise habits: Getting ready to start PT, walks with a cane so physical activity is limited   Dietary issues discussed: Encouraged to eat more fruits and vegetables and limit fried foods and carbs    Cardiac risk factors: Dm, father had heart disease and brother passed from an MI   Depression Screen  (Note: if answer to either of the following is "Yes", a more complete depression screening is indicated)   Over the past two weeks, have you felt down, depressed or hopeless? A little down from just getting out of the hospital  Over the past two weeks, have you felt little interest or pleasure in doing things? No  Have you lost interest or pleasure in daily life? No  Do you often feel hopeless? No  Do you cry easily over simple problems? No   Activities of Daily Living  In your present state of health, do you have any difficulty performing the following activities?  Driving?: Doesn't drive anymore  Managing money?: No Feeding yourself?:No Getting from bed to chair?: uses cane  Climbing a flight of stairs? Some trouble, maybe a few if she moves slowly  Preparing food and eating?yes just had pacemaker replaced 1 week ago Getting dressed?:yes, pacemaker replaced yesterday Getting to the toilet?:No Using the toilet?:No Moving around from place to place?: uses cane for stability   Fall Risk Assessment In the past year have you fallen or had a near fall?:No, uses a cane Are you currently  taking any medications that make you dizzy?:No   Hearing Difficulties: No Do you often ask people to speak up or repeat themselves?:No Do you experience ringing or noises in your ears?:No Do you have difficulty understanding soft or whispered voices?:No  Cognitive Testing  Alert? Yes Normal Appearance?Yes  Oriented to person? Yes Place? Yes  Time? Yes  Displays appropriate judgment?Yes  Can read the correct time from a watch face? yes Are you having problems remembering things?No  Advanced Directives have been discussed with the patient? No living will, already has the brochure , full code   List the Names of Other Physician/Practitioners you currently use:  Dr Lovena Le (cardio) , nephrology, Dr Glo Herring, [place]   Indicate any recent Medical Services you may have received from other than [facility] providers in the past year (date may be approximate).   Assessment:    Annual Wellness Exam   Plan:     Medicare Attestation  I have personally reviewed:  The patient's medical and social history  Their use of alcohol, tobacco or illicit drugs  Their current medications and supplements  The patient's functional ability including ADLs,fall risks, home safety risks, cognitive, and hearing and visual impairment  Diet and physical activities  Evidence for depression or mood disorders  The patient's weight, height, BMI, and visual acuity have been recorded in the chart. I have made referrals, counseling, and provided education to the patient based on review of the above and I have provided the patient with a written  personalized care plan for preventive services.      Review of Systems     Objective:   Physical Exam  BP 124/60 mmHg  Pulse 83  Resp 16  Ht 5\' 7"  (1.702 m)  Wt 224 lb 6.4 oz (101.787 kg)  BMI 35.14 kg/m2  SpO2 93%  Anterior chest wall, left, steri strips intact and covering surgical scar from recent incision where pt had pacemaker replaced on 02/10/2015        Assessment & Plan:  Seasonal allergies Uncontrolled x 4 weeks, add singulair   Need for Tdap vaccination Surgical site from recent pacemaker placement still has steri strips in place, wound still open partially. TdAp is past due and is administered by lPN after informed consent   Medicare annual wellness visit, subsequent Annual exam as documented. Counseling done  re healthy lifestyle involving commitment to 150 minutes exercise per week, heart healthy diet, and attaining healthy weight.The importance of adequate sleep also discussed. Regular seat belt use and home safety, is also discussed. Changes in health habits are decided on by the patient with goals and time frames  set for achieving them. Immunization and cancer screening needs are specifically addressed at this visit.

## 2015-02-17 NOTE — Assessment & Plan Note (Signed)
Uncontrolled x 4 weeks, add singulair

## 2015-02-17 NOTE — Assessment & Plan Note (Signed)

## 2015-02-18 ENCOUNTER — Ambulatory Visit: Payer: PRIVATE HEALTH INSURANCE

## 2015-02-18 DIAGNOSIS — I429 Cardiomyopathy, unspecified: Secondary | ICD-10-CM | POA: Diagnosis not present

## 2015-02-18 DIAGNOSIS — E119 Type 2 diabetes mellitus without complications: Secondary | ICD-10-CM | POA: Diagnosis not present

## 2015-02-18 DIAGNOSIS — I5020 Unspecified systolic (congestive) heart failure: Secondary | ICD-10-CM | POA: Diagnosis not present

## 2015-02-18 DIAGNOSIS — Z48812 Encounter for surgical aftercare following surgery on the circulatory system: Secondary | ICD-10-CM | POA: Diagnosis not present

## 2015-02-18 DIAGNOSIS — N181 Chronic kidney disease, stage 1: Secondary | ICD-10-CM | POA: Diagnosis not present

## 2015-02-18 DIAGNOSIS — I129 Hypertensive chronic kidney disease with stage 1 through stage 4 chronic kidney disease, or unspecified chronic kidney disease: Secondary | ICD-10-CM | POA: Diagnosis not present

## 2015-02-19 DIAGNOSIS — N181 Chronic kidney disease, stage 1: Secondary | ICD-10-CM | POA: Diagnosis not present

## 2015-02-19 DIAGNOSIS — Z48812 Encounter for surgical aftercare following surgery on the circulatory system: Secondary | ICD-10-CM | POA: Diagnosis not present

## 2015-02-19 DIAGNOSIS — I429 Cardiomyopathy, unspecified: Secondary | ICD-10-CM | POA: Diagnosis not present

## 2015-02-19 DIAGNOSIS — I5020 Unspecified systolic (congestive) heart failure: Secondary | ICD-10-CM | POA: Diagnosis not present

## 2015-02-19 DIAGNOSIS — E119 Type 2 diabetes mellitus without complications: Secondary | ICD-10-CM | POA: Diagnosis not present

## 2015-02-19 DIAGNOSIS — I129 Hypertensive chronic kidney disease with stage 1 through stage 4 chronic kidney disease, or unspecified chronic kidney disease: Secondary | ICD-10-CM | POA: Diagnosis not present

## 2015-02-22 DIAGNOSIS — N181 Chronic kidney disease, stage 1: Secondary | ICD-10-CM | POA: Diagnosis not present

## 2015-02-22 DIAGNOSIS — E119 Type 2 diabetes mellitus without complications: Secondary | ICD-10-CM | POA: Diagnosis not present

## 2015-02-22 DIAGNOSIS — I129 Hypertensive chronic kidney disease with stage 1 through stage 4 chronic kidney disease, or unspecified chronic kidney disease: Secondary | ICD-10-CM | POA: Diagnosis not present

## 2015-02-22 DIAGNOSIS — I5020 Unspecified systolic (congestive) heart failure: Secondary | ICD-10-CM | POA: Diagnosis not present

## 2015-02-22 DIAGNOSIS — I429 Cardiomyopathy, unspecified: Secondary | ICD-10-CM | POA: Diagnosis not present

## 2015-02-22 DIAGNOSIS — Z48812 Encounter for surgical aftercare following surgery on the circulatory system: Secondary | ICD-10-CM | POA: Diagnosis not present

## 2015-02-23 ENCOUNTER — Ambulatory Visit (INDEPENDENT_AMBULATORY_CARE_PROVIDER_SITE_OTHER): Payer: Medicare Other | Admitting: *Deleted

## 2015-02-23 DIAGNOSIS — I442 Atrioventricular block, complete: Secondary | ICD-10-CM | POA: Diagnosis not present

## 2015-02-23 DIAGNOSIS — I5020 Unspecified systolic (congestive) heart failure: Secondary | ICD-10-CM | POA: Diagnosis not present

## 2015-02-23 DIAGNOSIS — I129 Hypertensive chronic kidney disease with stage 1 through stage 4 chronic kidney disease, or unspecified chronic kidney disease: Secondary | ICD-10-CM | POA: Diagnosis not present

## 2015-02-23 DIAGNOSIS — I428 Other cardiomyopathies: Secondary | ICD-10-CM

## 2015-02-23 DIAGNOSIS — I5022 Chronic systolic (congestive) heart failure: Secondary | ICD-10-CM

## 2015-02-23 DIAGNOSIS — N181 Chronic kidney disease, stage 1: Secondary | ICD-10-CM | POA: Diagnosis not present

## 2015-02-23 DIAGNOSIS — I429 Cardiomyopathy, unspecified: Secondary | ICD-10-CM

## 2015-02-23 DIAGNOSIS — Z48812 Encounter for surgical aftercare following surgery on the circulatory system: Secondary | ICD-10-CM | POA: Diagnosis not present

## 2015-02-23 DIAGNOSIS — E119 Type 2 diabetes mellitus without complications: Secondary | ICD-10-CM | POA: Diagnosis not present

## 2015-02-23 LAB — CUP PACEART INCLINIC DEVICE CHECK
Battery Remaining Longevity: 23 mo
Battery Voltage: 2.99 V
Brady Statistic AP VP Percent: 65.66 %
Brady Statistic AP VS Percent: 0.01 %
Brady Statistic AS VP Percent: 34.28 %
Brady Statistic AS VS Percent: 0.05 %
Brady Statistic RA Percent Paced: 65.67 %
Brady Statistic RV Percent Paced: 99.63 %
Date Time Interrogation Session: 20160531172057
HighPow Impedance: 171 Ohm
HighPow Impedance: 32 Ohm
HighPow Impedance: 42 Ohm
Lead Channel Impedance Value: 304 Ohm
Lead Channel Impedance Value: 342 Ohm
Lead Channel Pacing Threshold Amplitude: 0.5 V
Lead Channel Pacing Threshold Amplitude: 0.5 V
Lead Channel Pacing Threshold Amplitude: 3.75 V
Lead Channel Pacing Threshold Pulse Width: 0.4 ms
Lead Channel Pacing Threshold Pulse Width: 0.8 ms
Lead Channel Pacing Threshold Pulse Width: 1.5 ms
Lead Channel Sensing Intrinsic Amplitude: 2.75 mV
Lead Channel Setting Pacing Amplitude: 1.5 V
Lead Channel Setting Pacing Amplitude: 3.5 V
Lead Channel Setting Pacing Amplitude: 6 V
Lead Channel Setting Pacing Pulse Width: 0.8 ms
Lead Channel Setting Pacing Pulse Width: 1.5 ms
Lead Channel Setting Sensing Sensitivity: 0.3 mV
Zone Setting Detection Interval: 320 ms
Zone Setting Detection Interval: 350 ms
Zone Setting Detection Interval: 360 ms
Zone Setting Detection Interval: 410 ms

## 2015-02-23 NOTE — Progress Notes (Signed)
CRT-D wound check in office. Wound edges approximated, no redness, swelling, or drainage. Pt instructed about activity restrictions and wound care. Thresholds and sensing consistent with previous device measurements. Lead impedance trends stable over time. No mode switch episodes recorded. No ventricular arrhythmia episodes recorded. Patient bi-ventricularly pacing >99% of the time. Device programmed with appropriate safety margins. Heart failure diagnostics reviewed and trends are stable for patient. No changes made this session. Estimated longevity 1.9 years.  Patient enrolled in remote follow up. ROV with [facility] in August. Pt wishes to follow in RDS.

## 2015-02-24 ENCOUNTER — Other Ambulatory Visit: Payer: Self-pay | Admitting: Family Medicine

## 2015-02-24 ENCOUNTER — Telehealth: Payer: Self-pay | Admitting: Family Medicine

## 2015-02-24 DIAGNOSIS — Z48812 Encounter for surgical aftercare following surgery on the circulatory system: Secondary | ICD-10-CM | POA: Diagnosis not present

## 2015-02-24 DIAGNOSIS — I429 Cardiomyopathy, unspecified: Secondary | ICD-10-CM | POA: Diagnosis not present

## 2015-02-24 DIAGNOSIS — N181 Chronic kidney disease, stage 1: Secondary | ICD-10-CM | POA: Diagnosis not present

## 2015-02-24 DIAGNOSIS — I5020 Unspecified systolic (congestive) heart failure: Secondary | ICD-10-CM | POA: Diagnosis not present

## 2015-02-24 DIAGNOSIS — E119 Type 2 diabetes mellitus without complications: Secondary | ICD-10-CM | POA: Diagnosis not present

## 2015-02-24 DIAGNOSIS — I129 Hypertensive chronic kidney disease with stage 1 through stage 4 chronic kidney disease, or unspecified chronic kidney disease: Secondary | ICD-10-CM | POA: Diagnosis not present

## 2015-02-24 NOTE — Telephone Encounter (Signed)
Attempted to call patient back.  Phone busy.  Will try again.

## 2015-02-25 DIAGNOSIS — Z1211 Encounter for screening for malignant neoplasm of colon: Secondary | ICD-10-CM

## 2015-02-25 LAB — HEMOCCULT GUIAC POC 1CARD (OFFICE)
Card #2 Fecal Occult Blod, POC: NEGATIVE
Card #3 Fecal Occult Blood, POC: NEGATIVE
Fecal Occult Blood, POC: NEGATIVE

## 2015-02-26 ENCOUNTER — Encounter (HOSPITAL_COMMUNITY): Payer: PRIVATE HEALTH INSURANCE

## 2015-02-26 ENCOUNTER — Encounter (HOSPITAL_COMMUNITY): Payer: BLUE CROSS/BLUE SHIELD

## 2015-02-26 DIAGNOSIS — N181 Chronic kidney disease, stage 1: Secondary | ICD-10-CM | POA: Diagnosis not present

## 2015-02-26 DIAGNOSIS — I129 Hypertensive chronic kidney disease with stage 1 through stage 4 chronic kidney disease, or unspecified chronic kidney disease: Secondary | ICD-10-CM | POA: Diagnosis not present

## 2015-02-26 DIAGNOSIS — I429 Cardiomyopathy, unspecified: Secondary | ICD-10-CM | POA: Diagnosis not present

## 2015-02-26 DIAGNOSIS — Z48812 Encounter for surgical aftercare following surgery on the circulatory system: Secondary | ICD-10-CM | POA: Diagnosis not present

## 2015-02-26 DIAGNOSIS — I5020 Unspecified systolic (congestive) heart failure: Secondary | ICD-10-CM | POA: Diagnosis not present

## 2015-02-26 DIAGNOSIS — E119 Type 2 diabetes mellitus without complications: Secondary | ICD-10-CM | POA: Diagnosis not present

## 2015-02-26 NOTE — Telephone Encounter (Signed)
Called and left message for patient.  I think that the concern has been addressed but asked that patient call back if she still needs assistance.

## 2015-03-01 DIAGNOSIS — Z48812 Encounter for surgical aftercare following surgery on the circulatory system: Secondary | ICD-10-CM | POA: Diagnosis not present

## 2015-03-01 DIAGNOSIS — I129 Hypertensive chronic kidney disease with stage 1 through stage 4 chronic kidney disease, or unspecified chronic kidney disease: Secondary | ICD-10-CM | POA: Diagnosis not present

## 2015-03-01 DIAGNOSIS — I5020 Unspecified systolic (congestive) heart failure: Secondary | ICD-10-CM | POA: Diagnosis not present

## 2015-03-01 DIAGNOSIS — N181 Chronic kidney disease, stage 1: Secondary | ICD-10-CM | POA: Diagnosis not present

## 2015-03-01 DIAGNOSIS — E119 Type 2 diabetes mellitus without complications: Secondary | ICD-10-CM | POA: Diagnosis not present

## 2015-03-01 DIAGNOSIS — I429 Cardiomyopathy, unspecified: Secondary | ICD-10-CM | POA: Diagnosis not present

## 2015-03-02 DIAGNOSIS — E119 Type 2 diabetes mellitus without complications: Secondary | ICD-10-CM | POA: Diagnosis not present

## 2015-03-02 DIAGNOSIS — Z48812 Encounter for surgical aftercare following surgery on the circulatory system: Secondary | ICD-10-CM | POA: Diagnosis not present

## 2015-03-02 DIAGNOSIS — N181 Chronic kidney disease, stage 1: Secondary | ICD-10-CM | POA: Diagnosis not present

## 2015-03-02 DIAGNOSIS — I429 Cardiomyopathy, unspecified: Secondary | ICD-10-CM | POA: Diagnosis not present

## 2015-03-02 DIAGNOSIS — I129 Hypertensive chronic kidney disease with stage 1 through stage 4 chronic kidney disease, or unspecified chronic kidney disease: Secondary | ICD-10-CM | POA: Diagnosis not present

## 2015-03-02 DIAGNOSIS — I5020 Unspecified systolic (congestive) heart failure: Secondary | ICD-10-CM | POA: Diagnosis not present

## 2015-03-03 DIAGNOSIS — E119 Type 2 diabetes mellitus without complications: Secondary | ICD-10-CM | POA: Diagnosis not present

## 2015-03-03 DIAGNOSIS — I129 Hypertensive chronic kidney disease with stage 1 through stage 4 chronic kidney disease, or unspecified chronic kidney disease: Secondary | ICD-10-CM | POA: Diagnosis not present

## 2015-03-03 DIAGNOSIS — I5020 Unspecified systolic (congestive) heart failure: Secondary | ICD-10-CM | POA: Diagnosis not present

## 2015-03-03 DIAGNOSIS — Z48812 Encounter for surgical aftercare following surgery on the circulatory system: Secondary | ICD-10-CM | POA: Diagnosis not present

## 2015-03-03 DIAGNOSIS — N181 Chronic kidney disease, stage 1: Secondary | ICD-10-CM | POA: Diagnosis not present

## 2015-03-03 DIAGNOSIS — I429 Cardiomyopathy, unspecified: Secondary | ICD-10-CM | POA: Diagnosis not present

## 2015-03-05 ENCOUNTER — Other Ambulatory Visit: Payer: Self-pay

## 2015-03-05 ENCOUNTER — Other Ambulatory Visit: Payer: Self-pay | Admitting: Family Medicine

## 2015-03-05 DIAGNOSIS — E119 Type 2 diabetes mellitus without complications: Secondary | ICD-10-CM | POA: Diagnosis not present

## 2015-03-05 DIAGNOSIS — I5020 Unspecified systolic (congestive) heart failure: Secondary | ICD-10-CM | POA: Diagnosis not present

## 2015-03-05 DIAGNOSIS — N181 Chronic kidney disease, stage 1: Secondary | ICD-10-CM | POA: Diagnosis not present

## 2015-03-05 DIAGNOSIS — I429 Cardiomyopathy, unspecified: Secondary | ICD-10-CM | POA: Diagnosis not present

## 2015-03-05 DIAGNOSIS — Z48812 Encounter for surgical aftercare following surgery on the circulatory system: Secondary | ICD-10-CM | POA: Diagnosis not present

## 2015-03-05 DIAGNOSIS — I129 Hypertensive chronic kidney disease with stage 1 through stage 4 chronic kidney disease, or unspecified chronic kidney disease: Secondary | ICD-10-CM | POA: Diagnosis not present

## 2015-03-05 MED ORDER — LOSARTAN POTASSIUM 50 MG PO TABS: ORAL_TABLET | ORAL | Status: DC

## 2015-03-08 ENCOUNTER — Encounter: Payer: Self-pay | Admitting: Cardiovascular Disease

## 2015-03-08 ENCOUNTER — Ambulatory Visit (INDEPENDENT_AMBULATORY_CARE_PROVIDER_SITE_OTHER): Payer: Medicare Other | Admitting: Cardiovascular Disease

## 2015-03-08 ENCOUNTER — Ambulatory Visit (INDEPENDENT_AMBULATORY_CARE_PROVIDER_SITE_OTHER): Payer: Medicare Other | Admitting: Pharmacist Clinician (PhC)/ Clinical Pharmacy Specialist

## 2015-03-08 VITALS — BP 121/62 | HR 78 | Ht 67.0 in | Wt 225.1 lb

## 2015-03-08 DIAGNOSIS — Z7901 Long term (current) use of anticoagulants: Secondary | ICD-10-CM

## 2015-03-08 DIAGNOSIS — Z95810 Presence of automatic (implantable) cardiac defibrillator: Secondary | ICD-10-CM

## 2015-03-08 DIAGNOSIS — Z952 Presence of prosthetic heart valve: Secondary | ICD-10-CM

## 2015-03-08 DIAGNOSIS — N181 Chronic kidney disease, stage 1: Secondary | ICD-10-CM | POA: Diagnosis not present

## 2015-03-08 DIAGNOSIS — Z48812 Encounter for surgical aftercare following surgery on the circulatory system: Secondary | ICD-10-CM | POA: Diagnosis not present

## 2015-03-08 DIAGNOSIS — Z954 Presence of other heart-valve replacement: Secondary | ICD-10-CM | POA: Diagnosis not present

## 2015-03-08 DIAGNOSIS — E119 Type 2 diabetes mellitus without complications: Secondary | ICD-10-CM | POA: Diagnosis not present

## 2015-03-08 DIAGNOSIS — I129 Hypertensive chronic kidney disease with stage 1 through stage 4 chronic kidney disease, or unspecified chronic kidney disease: Secondary | ICD-10-CM | POA: Diagnosis not present

## 2015-03-08 DIAGNOSIS — I5022 Chronic systolic (congestive) heart failure: Secondary | ICD-10-CM | POA: Diagnosis not present

## 2015-03-08 DIAGNOSIS — Z5181 Encounter for therapeutic drug level monitoring: Secondary | ICD-10-CM

## 2015-03-08 DIAGNOSIS — I255 Ischemic cardiomyopathy: Secondary | ICD-10-CM

## 2015-03-08 DIAGNOSIS — I442 Atrioventricular block, complete: Secondary | ICD-10-CM | POA: Diagnosis not present

## 2015-03-08 DIAGNOSIS — I429 Cardiomyopathy, unspecified: Secondary | ICD-10-CM | POA: Diagnosis not present

## 2015-03-08 DIAGNOSIS — I5020 Unspecified systolic (congestive) heart failure: Secondary | ICD-10-CM | POA: Diagnosis not present

## 2015-03-08 DIAGNOSIS — I428 Other cardiomyopathies: Secondary | ICD-10-CM

## 2015-03-08 LAB — POCT INR: INR: 1.8

## 2015-03-08 NOTE — Progress Notes (Signed)
Patient ID: [name] [name], female   DOB: [birth year], 81 y.o.   MRN: [id number]     Cardiology Office Note   Date:  03/08/2015   ID:  [name] [name], DOB 1932/05/05, MRN [id number]  PCP:  [name], MD  Cardiologist:   Sanda Klein, MD ; Cristopher Peru, MD  Chief Complaint  Patient presents with  . Follow-up    post surgery check up, feels tighe on and off, some bilateral swelling of feet      History of Present Illness: [name] [name] is a 81 y.o. female who presents for for valvular heart disease and congestive heart failure. Since I last saw her she underwent a complicated procedure with extraction of an old right ventricular lead and implantation of a new Medtronic Viva Quad XT CRT-D device on May 16, Dr. Lovena Le. She is pacemaker dependent and has complete heart block. She has swelling around the device site, which she reports has been present ever since the initial procedure. It looks like a hematoma. She is on warfarin anticoagulation for mechanical heart valve. She has normal white blood cell count has not had fever or chills. She is however feeling poorly, primarily weakness and fatigue. She reports that she missed 2 or 3 shots of erythropoietin around the time of this procedure. Her last hemoglobin was 8.5. She is due to have a repeat hemoglobin level and erythropoietin shot a week from today.  Device interrogation shows successful 99% biventricular pacing, but the left ventricular pacing threshold is rather high. It was 2.75V at 0.8 ms at implant, had increased to 3.75 V at 0.8 ms at the time of wound check on May 31, the automatic capture threshold today was 4.25 V at 1.5 ms. Based on this parameter, the estimated device longevity is only 2 years. Manual threshold testing was not performed today and no changes are made to device programming. No episodes of atrial fibrillation or ventricular tachycardia have been recorded. Atrial pacing occurs 83% of the time.  Has a complex  history of cardiac problems. She has undergone aortic and mitral valve replacements as 2 separate surgeries in 2001 in 2004 in [place]. Both prostheses are St. Jude's mechanical valves. Have not been able to secure any records regarding the size of these valves  Severe cardiomyopathy with left ventricular ejection fraction of 25%, which has been stable at this level at least 2004. Coronary angiography which was performed in 2001 before her first valve replacement showed normal coronaries (at that time LVEF was 60%).  Her most recent echocardiogram was performed in October of 2015. LVEF was 25-30%. Aortic prosthesis gradients were peak 25 and mean 15 mm Hg. Mitral valve prosthesis gradients were peak 12 and mean 9 mm Hg (heart rate not documented) in the mitral pressure half-time was 78 ms  In 2004 she also had complete heart block and received a dual chamber pacemaker in 2004. In 2006 she was upgraded to a dual-chamber a defibrillator. An attempt was made to upgrade the device to a biventricular ICD in 2006, but a coronary sinus lead could not be placed because of tortuosity of the brachiocephalic system and subclavian vein stenosis. She also had a sprint fidelis lead under advisory and a new defibrillator lead, Sprint Quattro secure was placed in 2007. On her chest x-ray she still has 2 defibrillator leads as well as a right ventricular pacemaker leads (3 ventricular leads are present) as well as an atrial lead. She was offered to upgrade to a biventricular device  in 2011, but reportedly declined so she had a EOL generator change performed by Dr. Rosita Fire at that time.  To my knowledge has never received therapy from any of her ICDs. Heart rate histograms show very blunted the distribution she is very inactive.  Lower extremity arterial Dopplers were normal in 2013. Venous Doppler of the right lower extremity did not show DVT in 2013.  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 She has diabetes mellitus and hyperlipidemia and hyperuricemia.  Past Medical History  Diagnosis Date  . DJD (degenerative joint disease) of lumbar spine   . Esophageal reflux   . Other and unspecified hyperlipidemia   . Non-ischemic cardiomyopathy     a. s/p MDT CRTD  . Obesity, unspecified   . 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 IDA (iron deficiency anemia)     Parenteral iron/[name]  . Adenomatous polyp of colon 12/16/2002    Dr. [name]/[facility]  . Hyperlipemia   . COPD (chronic obstructive pulmonary disease)        . CHF (congestive heart failure)        . Constipation        . Gout        . CHB (complete heart block) 10/07/2014       . Insomnia        . Unspecified hypothyroidism        . Vertigo        . DM (diabetes mellitus) 2006  . Chronic kidney disease, stage I 2006  . Thyroid cancer     remote thyroidectomy, no recurrence, pt denies in 2016 thyroid cancer  . Oxygen deficiency 2014    nocturnal;  . Heart murmur   . Allergy     Past Surgical History  Procedure Laterality Date  . Thyroidectomy    . Pacemaker placed  2004  . Aortic and mitral valve replacement  2001  . Right breast cyst removed      benign   . Right cataract removed      2005  . Defibrillator implanted 2006    . Cardiac valve replacement    . Cardiac catheterization    . Insert / replace / remove pacemaker    . Esophagogastroduodenoscopy  06/12/2007    Rourk- normal esophagus, small hiatal hernia, otherwise normal stomach, D1, D2  . Colonoscopy  06/12/2007    Rourk- Normal rectum/Normal colon  . Colonoscopy  12/16/02    small hemorrhoids  . Colonoscopy  06/20/2012    Procedure: COLONOSCOPY;  Surgeon: Daneil Dolin, MD;  Location: [facility];  Service: Endoscopy;  Laterality: N/A;  10:45  . Doppler echocardiography  2012  . Doppler echocardiography   05,06,07,08,09,2011  . Bi-ventricular implantable cardioverter defibrillator upgrade N/A 01/18/2015    Procedure: BI-VENTRICULAR IMPLANTABLE CARDIOVERTER DEFIBRILLATOR UPGRADE;  Surgeon: Evans Lance, MD;  Location: [facility] CATH LAB;  Service: Cardiovascular;  Laterality: N/A;  . Icd lead removal Left 02/08/2015    Procedure: ICD LEAD REMOVAL;  Surgeon: Evans Lance, MD;  Location: [facility] OR;  Service: Cardiovascular;  Laterality: Left;  "Will plan extraction and insertion of a BiV PM"  **Dr. Roxan Hockey backing up case**  . Implantable cardioverter defibrillator (icd) generator change Left 02/08/2015    Procedure: ICD GENERATOR CHANGE;  Surgeon: Evans Lance, MD;  Location: [place];  Service: Cardiovascular;  Laterality: Left;  .  Eye surgery Right 2005  . Eye surgery Left 2006     Current Outpatient Prescriptions  Medication Sig Dispense Refill  . acetaminophen (TYLENOL) 500 MG tablet Take 500 mg by mouth every 6 (six) hours as needed (pain).    Marland Kitchen albuterol (PROVENTIL HFA;VENTOLIN HFA) 108 (90 BASE) MCG/ACT inhaler Inhale 2 puffs into the lungs every 6 (six) hours as needed for wheezing or shortness of breath. 1 Inhaler 3  . albuterol (PROVENTIL) (2.5 MG/3ML) 0.083% nebulizer solution INHALE 1 VIAL VIA NEBULIZER THREE TIMES DAILY. (Patient taking differently: INHALE 1 VIAL VIA NEBULIZER TWO TIMES DAILY.) 300 mL 3  . allopurinol (ZYLOPRIM) 100 MG tablet take 1 tablet by mouth once daily 90 tablet 1  . cholecalciferol (VITAMIN D) 1000 UNITS tablet Take 1,000 Units by mouth daily.    . Choline Fenofibrate (TRILIPIX) 135 MG capsule Take 1 capsule (135 mg total) by mouth daily. (Patient taking differently: Take 135 mg by mouth at bedtime. ) 90 capsule 1  . clindamycin (CLEOCIN) 300 MG capsule Take 600 mg by mouth See admin instructions. Take 2 capsules (600 mg) one hour prior to dental appointment  0  . docusate sodium (COLACE) 100 MG capsule Take 300 mg by mouth at bedtime.    Marland Kitchen epoetin alfa  (EPOGEN,PROCRIT) 16109 UNIT/ML injection Inject 40,000 Units into the skin once.    . fluticasone (FLONASE) 50 MCG/ACT nasal spray instill 2 sprays into each nostril once daily (Patient taking differently: instill 2 sprays into each nostril once daily as needed for congestion) 16 g 2  . Fluticasone-Salmeterol (ADVAIR DISKUS) 100-50 MCG/DOSE AEPB inhale 1 dose by mouth twice a day (Patient taking differently: Inhale 1 puff into the lungs 2 (two) times daily. ) 60 each 4  . folic acid (FOLVITE) 1 MG tablet take 1 tablet by mouth once daily (Patient taking differently: Take 1 mg by mouth daily at 6 PM. ) 100 tablet 6  . furosemide (LASIX) 40 MG tablet Take 1 tablet by mouth  daily 90 tablet 0  . gabapentin (NEURONTIN) 300 MG capsule take 1 capsule by mouth at bedtime 90 capsule 1  . hydrALAZINE (APRESOLINE) 25 MG tablet Take 1 tablet (25 mg total) by mouth 2 (two) times daily. 270 tablet 3  . HYDROcodone-acetaminophen (NORCO) 7.5-325 MG per tablet Take 0.5-1 tablets by mouth 2 (two) times daily as needed (pain).  30 tablet 0  . isosorbide dinitrate (ISOCHRON) 40 MG CR tablet Take 1/2 tablet (20mg  total) twice a day. (Patient taking differently: Take 20 mg by mouth 2 (two) times daily. ) 45 tablet 3  . losartan (COZAAR) 50 MG tablet Take 1 tablet by mouth  daily 10 tablet 0  . meclizine (ANTIVERT) 12.5 MG tablet Take 1 tablet (12.5 mg total) by mouth 2 (two) times daily as needed for dizziness. 30 tablet 2  . metolazone (ZAROXOLYN) 2.5 MG tablet Take 2.5 mg by mouth once a week. On Wednesday    . metoprolol succinate (TOPROL-XL) 50 MG 24 hr tablet Take 100 mg by mouth daily at 6 PM. Take with or immediately following a meal.    . Multiple Vitamin (MULTIVITAMIN WITH MINERALS) TABS tablet Take 1 tablet by mouth daily. Centrum Silver    . ondansetron (ZOFRAN) 4 MG tablet Take 4 mg by mouth daily as needed for nausea or vomiting.     [name] [name] (SYSTANE OP) Place 1 drop into both eyes  daily as needed (dry eyes).    . polyethylene glycol  powder (MIRALAX) powder Take 17 g by mouth daily as needed for mild constipation.     . potassium chloride SA (KLOR-CON M20) 20 MEQ tablet Take 1 tablet by mouth  daily 90 tablet 1  . SYNTHROID 75 MCG tablet Take 1 tablet by mouth  daily 90 tablet 0  . temazepam (RESTORIL) 30 MG capsule take 1 capsule at bedtime if needed (Patient taking differently: take 1 capsule at bedtime if needed for sleep) 30 capsule 2  . Vitamin Mixture (ESTER-C PO) Take 1 tablet by mouth daily as needed (for immune system boost).    . VYTORIN 10-20 MG per tablet Take 1 tablet by mouth 3  times weekly 39 tablet 0  . warfarin (COUMADIN) 5 MG tablet Take as directed by coumadin clinic (Patient taking differently: Take 5-7.5 mg by mouth at bedtime. Take 1 tablet (5 mg) on Tuesday, Thursday, Saturday, take 1 1/2 tablets (7.5 mg) on Sunday, Monday, Wednesday, Frday - or as directed by coumadin clinic) 10 tablet 0   No current facility-administered medications for this visit.   Facility-Administered Medications Ordered in Other Visits  Medication Dose Route Frequency Provider Last Rate Last Dose  . epoetin alfa (EPOGEN,PROCRIT) injection 40,000 Units  40,000 Units Subcutaneous Once Farrel Gobble, MD        Allergies:   Aspirin; Fentanyl; Niacin; and Penicillins    Social History:  The patient  reports that she quit smoking about 28 years ago. Her smoking use included Cigarettes. She has a 30 pack-year smoking history. She has never used smokeless tobacco. She reports that she does not drink alcohol or use illicit drugs.   Family History:  The patient's family history includes Cancer in her mother; Diabetes in her brother and brother; Heart disease in her brother, brother, father, and sister; Hypertension in her brother.    ROS:  Please see the history of present illness.    Otherwise, review of systems positive for fatigue but denies anorexia, fever or chills.   All  other systems are reviewed and negative.    PHYSICAL EXAM: VS:  BP 121/62 mmHg  Pulse 78  Ht 5\' 7"  (1.702 m)  Wt 225 lb 1.6 oz (102.105 kg)  BMI 35.25 kg/m2 , BMI Body mass index is 35.25 kg/(m^2). General: Alert, oriented x3, no distress, moderate to severely obese Head: no evidence of trauma, PERRL, EOMI, no exophtalmos or lid lag, no myxedema, no xanthelasma; normal ears, nose and oropharynx Neck: normal jugular venous pulsations and no hepatojugular reflux; brisk carotid pulses without delay and no carotid bruits Chest: clear to auscultation, no signs of consolidation by percussion or palpation, normal fremitus, symmetrical and full respiratory excursions. The left subclavian site shows 2 scars that are both well-healed. The defibrillator pocket is moderately distended, but not tense with an overlying hematoma. The area is not warm or red, but is fluctuant. The appearance is most consistent with a pocket hematoma. There are large distended collateral veins overlying the left shoulder the entire left anterior chest and the left cervical area consistent with an occluded left subclavian/brachiocephalic vein.  Cardiovascular: normal position and quality of the apical impulse, regular rhythm, very crisp prosthetic valve sounds, grade 2/6 systolic ejection murmur in the aortic focus, no diastolic rumble or murmur , No rubs or gallops Abdomen: no tenderness or distention, no masses by palpation, no abnormal pulsatility or arterial bruits, normal bowel sounds, no hepatosplenomegaly Extremities: no clubbing, cyanosis or edema; 2+ radial, ulnar and brachial pulses bilaterally; 2+ right femoral,  posterior tibial and dorsalis pedis pulses; 2+ left femoral, posterior tibial and dorsalis pedis pulses; no subclavian or femoral bruits Neurological: grossly nonfocal Psych: euthymic mood, full affect   EKG:  EKG is not ordered today.   Recent Labs: 04/05/2014: Pro B Natriuretic peptide (BNP)  1713.0* 02/04/2015: Platelets 237 02/08/2015: Hemoglobin 8.5* 02/15/2015: ALT 12; BUN 54*; Creat 2.34*; Potassium 4.7; Sodium 142; TSH 3.070    Lipid Panel    Component Value Date/Time   CHOL 130 09/14/2014 1239   TRIG 132 09/14/2014 1239   HDL 35* 09/14/2014 1239   CHOLHDL 3.7 09/14/2014 1239   VLDL 26 09/14/2014 1239   LDLCALC 69 09/14/2014 1239      Wt Readings from Last 3 Encounters:  03/08/15 225 lb 1.6 oz (102.105 kg)  02/17/15 224 lb 6.4 oz (101.787 kg)  02/11/15 229 lb 6.4 oz (104.055 kg)      Other studies Reviewed: Additional studies/ records that were reviewed today include: records of CRT-D implant and subsequent f/u and device check..    ASSESSMENT AND PLAN:  ICD pocket hematoma Don't think she has a pocket infection. She is fully anticoagulated and should remain so for her prosthetic valves. She has no systemic signs of infection and had a recent normal white blood count. INR today was actually low at 1.8 and the warfarin dose was adjusted.  Left ventricular lead thresholds are steadily increasing. Her next appointment Dr. Lovena Le is in August. [facility] ask him with a he believes he needs to see her sooner. Was not able to perform multi vector testing today with [facility].  Non-ischemic cardiomyopathy with ICD The patient has severe nonischemic cardiomyopathy, likely secondary to valvular heart disease. Her left ventricular systolic function has been stable for the last many years. She is on appropriate vasodilator and beta blocker therapy. She is also on digoxin, which needs to be used with great caution because of renal insufficiency.  Chronic systolic heart failure She appears fairly stable NYHA class II-III heart failure and as far as I can tell today by clinical exam, she is euvolemic. Use of diuretics has to be cautious because she also has fairly significant renal failure. Her device thoracic impedance recording is not yet mature for use. She is on  angiotensin receptor blockers despite her advanced kidney disease.  S/P MVR (mitral valve replacement) Device size unknown. Reportedly a St. Jude valve. Gradients were slightly higher than expected on her last echocardiogram, but unchanged from 2014.   S/P AVR (aortic valve replacement) Device size unknown. Reportedly a St. Jude valve.  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 Recent worsening anemia is related to missing several erythropoietin doses Anemia will be poorly tolerated due to the presence of a mechanical mitral valve with increased gradients and left ventricular systolic failure. I think the anemia is the most slight reason she does not feel well today.Marland Kitchen Has an upcoming appointment for erythropoietin administration and her hemoglobin will be rechecked at that time.   Current medicines are reviewed at length with the patient today.  The patient does not have concerns regarding medicines.  The following changes have been made:  no change  Labs/ tests ordered today include:  No orders of the defined types were placed in this encounter.    Patient Instructions  Dr. Sallyanne Kuster  recommends that you schedule a follow-up appointment in: 6 months.     Mikael Spray, MD  03/08/2015 6:08 PM    Sanda Klein, MD, [facility] [phone] office [phone] pager

## 2015-03-08 NOTE — Patient Instructions (Signed)
Dr. Croitoru recommends that you schedule a follow-up appointment in: 6 months    

## 2015-03-09 ENCOUNTER — Other Ambulatory Visit: Payer: Self-pay | Admitting: Family Medicine

## 2015-03-09 ENCOUNTER — Telehealth: Payer: Self-pay | Admitting: Family Medicine

## 2015-03-09 ENCOUNTER — Other Ambulatory Visit (HOSPITAL_COMMUNITY): Payer: Self-pay

## 2015-03-09 DIAGNOSIS — E785 Hyperlipidemia, unspecified: Secondary | ICD-10-CM

## 2015-03-09 DIAGNOSIS — Z48812 Encounter for surgical aftercare following surgery on the circulatory system: Secondary | ICD-10-CM | POA: Diagnosis not present

## 2015-03-09 DIAGNOSIS — I129 Hypertensive chronic kidney disease with stage 1 through stage 4 chronic kidney disease, or unspecified chronic kidney disease: Secondary | ICD-10-CM | POA: Diagnosis not present

## 2015-03-09 DIAGNOSIS — I429 Cardiomyopathy, unspecified: Secondary | ICD-10-CM | POA: Diagnosis not present

## 2015-03-09 DIAGNOSIS — E1122 Type 2 diabetes mellitus with diabetic chronic kidney disease: Secondary | ICD-10-CM

## 2015-03-09 DIAGNOSIS — E119 Type 2 diabetes mellitus without complications: Secondary | ICD-10-CM | POA: Diagnosis not present

## 2015-03-09 DIAGNOSIS — I5020 Unspecified systolic (congestive) heart failure: Secondary | ICD-10-CM | POA: Diagnosis not present

## 2015-03-09 DIAGNOSIS — N181 Chronic kidney disease, stage 1: Secondary | ICD-10-CM | POA: Diagnosis not present

## 2015-03-09 LAB — CUP PACEART INCLINIC DEVICE CHECK
Battery Remaining Longevity: 24 mo
Battery Voltage: 2.98 V
Brady Statistic AP VP Percent: 83.28 %
Brady Statistic AP VS Percent: 0.14 %
Brady Statistic AS VP Percent: 16.56 %
Brady Statistic AS VS Percent: 0.03 %
Brady Statistic RA Percent Paced: 83.41 %
Brady Statistic RV Percent Paced: 99.62 %
Date Time Interrogation Session: 20160613181828
HighPow Impedance: 32 Ohm
HighPow Impedance: 42 Ohm
Lead Channel Impedance Value: 304 Ohm
Lead Channel Impedance Value: 304 Ohm
Lead Channel Impedance Value: 361 Ohm
Lead Channel Impedance Value: 361 Ohm
Lead Channel Impedance Value: 361 Ohm
Lead Channel Impedance Value: 418 Ohm
Lead Channel Impedance Value: 513 Ohm
Lead Channel Impedance Value: 551 Ohm
Lead Channel Impedance Value: 608 Ohm
Lead Channel Impedance Value: 646 Ohm
Lead Channel Impedance Value: 665 Ohm
Lead Channel Impedance Value: 760 Ohm
Lead Channel Impedance Value: 779 Ohm
Lead Channel Pacing Threshold Amplitude: 0.5 V
Lead Channel Pacing Threshold Amplitude: 1.375 V
Lead Channel Pacing Threshold Amplitude: 4.25 V
Lead Channel Pacing Threshold Pulse Width: 0.4 ms
Lead Channel Pacing Threshold Pulse Width: 0.4 ms
Lead Channel Pacing Threshold Pulse Width: 1.5 ms
Lead Channel Sensing Intrinsic Amplitude: 1.625 mV
Lead Channel Sensing Intrinsic Amplitude: 1.625 mV
Lead Channel Setting Pacing Amplitude: 1.5 V
Lead Channel Setting Pacing Amplitude: 3.5 V
Lead Channel Setting Pacing Amplitude: 6 V
Lead Channel Setting Pacing Pulse Width: 0.8 ms
Lead Channel Setting Pacing Pulse Width: 1.5 ms
Lead Channel Setting Sensing Sensitivity: 0.3 mV
Zone Setting Detection Interval: 320 ms
Zone Setting Detection Interval: 350 ms
Zone Setting Detection Interval: 360 ms
Zone Setting Detection Interval: 410 ms

## 2015-03-09 NOTE — Telephone Encounter (Signed)
Pl contact o pt and advise her to stop fenofibrate, and use only vytorin and low fat diet for her cholesterol management, based on new guiodelines this is recommendation to use fewer agent for safety reasons, needs re[pt fasting lipid, cmp and eGFr, in 3.5 months  Assure her no liver damage from her current meds pls  I have d/c trilipix pLS also send msg or notify her pharmacy

## 2015-03-10 DIAGNOSIS — N181 Chronic kidney disease, stage 1: Secondary | ICD-10-CM | POA: Diagnosis not present

## 2015-03-10 DIAGNOSIS — I429 Cardiomyopathy, unspecified: Secondary | ICD-10-CM | POA: Diagnosis not present

## 2015-03-10 DIAGNOSIS — D649 Anemia, unspecified: Secondary | ICD-10-CM | POA: Diagnosis not present

## 2015-03-10 DIAGNOSIS — I129 Hypertensive chronic kidney disease with stage 1 through stage 4 chronic kidney disease, or unspecified chronic kidney disease: Secondary | ICD-10-CM | POA: Diagnosis not present

## 2015-03-10 DIAGNOSIS — I10 Essential (primary) hypertension: Secondary | ICD-10-CM | POA: Diagnosis not present

## 2015-03-10 DIAGNOSIS — E119 Type 2 diabetes mellitus without complications: Secondary | ICD-10-CM | POA: Diagnosis not present

## 2015-03-10 DIAGNOSIS — N183 Chronic kidney disease, stage 3 (moderate): Secondary | ICD-10-CM | POA: Diagnosis not present

## 2015-03-10 DIAGNOSIS — R809 Proteinuria, unspecified: Secondary | ICD-10-CM | POA: Diagnosis not present

## 2015-03-10 DIAGNOSIS — E559 Vitamin D deficiency, unspecified: Secondary | ICD-10-CM | POA: Diagnosis not present

## 2015-03-10 DIAGNOSIS — Z79899 Other long term (current) drug therapy: Secondary | ICD-10-CM | POA: Diagnosis not present

## 2015-03-10 DIAGNOSIS — I5020 Unspecified systolic (congestive) heart failure: Secondary | ICD-10-CM | POA: Diagnosis not present

## 2015-03-10 DIAGNOSIS — Z48812 Encounter for surgical aftercare following surgery on the circulatory system: Secondary | ICD-10-CM | POA: Diagnosis not present

## 2015-03-11 DIAGNOSIS — N181 Chronic kidney disease, stage 1: Secondary | ICD-10-CM | POA: Diagnosis not present

## 2015-03-11 DIAGNOSIS — I5020 Unspecified systolic (congestive) heart failure: Secondary | ICD-10-CM | POA: Diagnosis not present

## 2015-03-11 DIAGNOSIS — Z48812 Encounter for surgical aftercare following surgery on the circulatory system: Secondary | ICD-10-CM | POA: Diagnosis not present

## 2015-03-11 DIAGNOSIS — E119 Type 2 diabetes mellitus without complications: Secondary | ICD-10-CM | POA: Diagnosis not present

## 2015-03-11 DIAGNOSIS — I129 Hypertensive chronic kidney disease with stage 1 through stage 4 chronic kidney disease, or unspecified chronic kidney disease: Secondary | ICD-10-CM | POA: Diagnosis not present

## 2015-03-11 DIAGNOSIS — I429 Cardiomyopathy, unspecified: Secondary | ICD-10-CM | POA: Diagnosis not present

## 2015-03-12 ENCOUNTER — Telehealth: Payer: Self-pay | Admitting: Internal Medicine

## 2015-03-12 NOTE — Addendum Note (Signed)
Addended by: Denman George B on: 03/12/2015 10:05 AM   Modules accepted: Orders

## 2015-03-12 NOTE — Telephone Encounter (Signed)
New Prob   Wanting to continue PT for 2 more weeks. Please call and advise.

## 2015-03-12 NOTE — Telephone Encounter (Signed)
Patient aware. D/c note sent to pharmacies.  Repeat labs mailed to patient to do in early October.

## 2015-03-12 NOTE — Telephone Encounter (Signed)
Left message for physical therapist to return call.

## 2015-03-14 ENCOUNTER — Other Ambulatory Visit: Payer: Self-pay | Admitting: Family Medicine

## 2015-03-15 ENCOUNTER — Ambulatory Visit: Payer: PRIVATE HEALTH INSURANCE | Admitting: Cardiovascular Disease

## 2015-03-15 ENCOUNTER — Other Ambulatory Visit (HOSPITAL_COMMUNITY): Payer: Self-pay

## 2015-03-15 ENCOUNTER — Encounter (HOSPITAL_BASED_OUTPATIENT_CLINIC_OR_DEPARTMENT_OTHER): Payer: Medicare Other

## 2015-03-15 ENCOUNTER — Encounter (HOSPITAL_COMMUNITY): Payer: Medicare Other | Attending: Internal Medicine

## 2015-03-15 ENCOUNTER — Encounter (HOSPITAL_COMMUNITY): Payer: Self-pay

## 2015-03-15 VITALS — BP 149/57 | HR 72 | Temp 98.0°F | Resp 20

## 2015-03-15 DIAGNOSIS — D638 Anemia in other chronic diseases classified elsewhere: Secondary | ICD-10-CM | POA: Insufficient documentation

## 2015-03-15 DIAGNOSIS — D649 Anemia, unspecified: Secondary | ICD-10-CM | POA: Diagnosis present

## 2015-03-15 DIAGNOSIS — E611 Iron deficiency: Secondary | ICD-10-CM

## 2015-03-15 LAB — CBC
HCT: 27.7 % — ABNORMAL LOW (ref 36.0–46.0)
Hemoglobin: 8.8 g/dL — ABNORMAL LOW (ref 12.0–15.0)
MCH: 30.9 pg (ref 26.0–34.0)
MCHC: 31.8 g/dL (ref 30.0–36.0)
MCV: 97.2 fL (ref 78.0–100.0)
Platelets: 199 10*3/uL (ref 150–400)
RBC: 2.85 MIL/uL — ABNORMAL LOW (ref 3.87–5.11)
RDW: 16.7 % — ABNORMAL HIGH (ref 11.5–15.5)
WBC: 5.4 10*3/uL (ref 4.0–10.5)

## 2015-03-15 LAB — FERRITIN: Ferritin: 1380 ng/mL — ABNORMAL HIGH (ref 11–307)

## 2015-03-15 MED ORDER — EPOETIN ALFA 40000 UNIT/ML IJ SOLN
40000.0000 [IU] | Freq: Once | INTRAMUSCULAR | Status: AC
  Administered 2015-03-15: 40000 [IU] via SUBCUTANEOUS

## 2015-03-15 MED ORDER — EPOETIN ALFA 40000 UNIT/ML IJ SOLN: 40000.0000 [IU] | Freq: Once | INTRAMUSCULAR | Status: DC

## 2015-03-15 MED ORDER — EPOETIN ALFA 40000 UNIT/ML IJ SOLN
INTRAMUSCULAR | Status: AC
  Filled 2015-03-15: qty 1

## 2015-03-15 NOTE — Progress Notes (Signed)
[name] [name] presents today for injection per MD orders. Procrit 40,000 units administered SQ in left Abdomen. Administration without incident. Patient tolerated well.  

## 2015-03-15 NOTE — Telephone Encounter (Signed)
Informed Amy OK to continue PT, she voiced understanding, thanks.

## 2015-03-15 NOTE — Patient Instructions (Signed)
[place] at [facility] Discharge Instructions  RECOMMENDATIONS MADE BY THE CONSULTANT AND ANY TEST RESULTS WILL BE SENT TO YOUR REFERRING PHYSICIAN.  You received your procrit today. Call for any concerns or questions. Follow up as scheduled.  Thank you for choosing [place] at [facility] to provide your oncology and hematology care.  To afford each patient quality time with our provider, please arrive at least 15 minutes before your scheduled appointment time.    You need to re-schedule your appointment should you arrive 10 or more minutes late.  We strive to give you quality time with our providers, and arriving late affects you and other patients whose appointments are after yours.  Also, if you no show three or more times for appointments you may be dismissed from the clinic at the providers discretion.     Again, thank you for choosing [facility].  Our hope is that these requests will decrease the amount of time that you wait before being seen by our physicians.       _____________________________________________________________  Should you have questions after your visit to [facility], please contact our office at [phone] between the hours of 8:30 a.m. and 4:30 p.m.  Voicemails left after 4:30 p.m. will not be returned until the following business day.  For prescription refill requests, have your pharmacy contact our office.

## 2015-03-15 NOTE — Telephone Encounter (Signed)
Ok to continue PT.

## 2015-03-15 NOTE — Progress Notes (Signed)
Labs drawn

## 2015-03-15 NOTE — Telephone Encounter (Signed)
Received VM message from Amy, Phys Therapist. Requests verbal order to continue patient's PT for 2 more weeks. Cites decreased endurance, continued fatigue w/ physical activity.  Routing to Dr. Sallyanne Kuster for OK.

## 2015-03-16 ENCOUNTER — Encounter: Payer: Self-pay | Admitting: Internal Medicine

## 2015-03-16 ENCOUNTER — Other Ambulatory Visit (HOSPITAL_COMMUNITY): Payer: Self-pay | Admitting: Oncology

## 2015-03-17 DIAGNOSIS — N181 Chronic kidney disease, stage 1: Secondary | ICD-10-CM | POA: Diagnosis not present

## 2015-03-17 DIAGNOSIS — I129 Hypertensive chronic kidney disease with stage 1 through stage 4 chronic kidney disease, or unspecified chronic kidney disease: Secondary | ICD-10-CM | POA: Diagnosis not present

## 2015-03-17 DIAGNOSIS — I429 Cardiomyopathy, unspecified: Secondary | ICD-10-CM | POA: Diagnosis not present

## 2015-03-17 DIAGNOSIS — E119 Type 2 diabetes mellitus without complications: Secondary | ICD-10-CM | POA: Diagnosis not present

## 2015-03-17 DIAGNOSIS — Z48812 Encounter for surgical aftercare following surgery on the circulatory system: Secondary | ICD-10-CM | POA: Diagnosis not present

## 2015-03-17 DIAGNOSIS — D649 Anemia, unspecified: Secondary | ICD-10-CM | POA: Diagnosis not present

## 2015-03-17 DIAGNOSIS — I5020 Unspecified systolic (congestive) heart failure: Secondary | ICD-10-CM | POA: Diagnosis not present

## 2015-03-17 DIAGNOSIS — I10 Essential (primary) hypertension: Secondary | ICD-10-CM | POA: Diagnosis not present

## 2015-03-17 DIAGNOSIS — R809 Proteinuria, unspecified: Secondary | ICD-10-CM | POA: Diagnosis not present

## 2015-03-17 DIAGNOSIS — E875 Hyperkalemia: Secondary | ICD-10-CM | POA: Diagnosis not present

## 2015-03-17 DIAGNOSIS — N184 Chronic kidney disease, stage 4 (severe): Secondary | ICD-10-CM | POA: Diagnosis not present

## 2015-03-19 ENCOUNTER — Encounter (HOSPITAL_COMMUNITY): Payer: BLUE CROSS/BLUE SHIELD

## 2015-03-19 ENCOUNTER — Encounter (HOSPITAL_COMMUNITY): Payer: PRIVATE HEALTH INSURANCE

## 2015-03-19 DIAGNOSIS — N181 Chronic kidney disease, stage 1: Secondary | ICD-10-CM | POA: Diagnosis not present

## 2015-03-19 DIAGNOSIS — E119 Type 2 diabetes mellitus without complications: Secondary | ICD-10-CM | POA: Diagnosis not present

## 2015-03-19 DIAGNOSIS — I429 Cardiomyopathy, unspecified: Secondary | ICD-10-CM | POA: Diagnosis not present

## 2015-03-19 DIAGNOSIS — Z48812 Encounter for surgical aftercare following surgery on the circulatory system: Secondary | ICD-10-CM | POA: Diagnosis not present

## 2015-03-19 DIAGNOSIS — I5020 Unspecified systolic (congestive) heart failure: Secondary | ICD-10-CM | POA: Diagnosis not present

## 2015-03-19 DIAGNOSIS — I129 Hypertensive chronic kidney disease with stage 1 through stage 4 chronic kidney disease, or unspecified chronic kidney disease: Secondary | ICD-10-CM | POA: Diagnosis not present

## 2015-03-22 ENCOUNTER — Telehealth: Payer: Self-pay

## 2015-03-22 ENCOUNTER — Ambulatory Visit (INDEPENDENT_AMBULATORY_CARE_PROVIDER_SITE_OTHER): Payer: Medicare Other | Admitting: *Deleted

## 2015-03-22 DIAGNOSIS — I429 Cardiomyopathy, unspecified: Secondary | ICD-10-CM | POA: Diagnosis not present

## 2015-03-22 DIAGNOSIS — B351 Tinea unguium: Secondary | ICD-10-CM | POA: Diagnosis not present

## 2015-03-22 DIAGNOSIS — Z7901 Long term (current) use of anticoagulants: Secondary | ICD-10-CM | POA: Diagnosis not present

## 2015-03-22 DIAGNOSIS — E1342 Other specified diabetes mellitus with diabetic polyneuropathy: Secondary | ICD-10-CM | POA: Diagnosis not present

## 2015-03-22 DIAGNOSIS — Z954 Presence of other heart-valve replacement: Secondary | ICD-10-CM

## 2015-03-22 DIAGNOSIS — Z952 Presence of prosthetic heart valve: Secondary | ICD-10-CM

## 2015-03-22 DIAGNOSIS — L851 Acquired keratosis [keratoderma] palmaris et plantaris: Secondary | ICD-10-CM | POA: Diagnosis not present

## 2015-03-22 DIAGNOSIS — I129 Hypertensive chronic kidney disease with stage 1 through stage 4 chronic kidney disease, or unspecified chronic kidney disease: Secondary | ICD-10-CM | POA: Diagnosis not present

## 2015-03-22 DIAGNOSIS — I5020 Unspecified systolic (congestive) heart failure: Secondary | ICD-10-CM | POA: Diagnosis not present

## 2015-03-22 DIAGNOSIS — L609 Nail disorder, unspecified: Secondary | ICD-10-CM | POA: Diagnosis not present

## 2015-03-22 DIAGNOSIS — N181 Chronic kidney disease, stage 1: Secondary | ICD-10-CM | POA: Diagnosis not present

## 2015-03-22 DIAGNOSIS — Z5181 Encounter for therapeutic drug level monitoring: Secondary | ICD-10-CM | POA: Diagnosis not present

## 2015-03-22 DIAGNOSIS — M79671 Pain in right foot: Secondary | ICD-10-CM | POA: Diagnosis not present

## 2015-03-22 DIAGNOSIS — Z48812 Encounter for surgical aftercare following surgery on the circulatory system: Secondary | ICD-10-CM | POA: Diagnosis not present

## 2015-03-22 DIAGNOSIS — E119 Type 2 diabetes mellitus without complications: Secondary | ICD-10-CM | POA: Diagnosis not present

## 2015-03-22 LAB — POCT INR: INR: 1.6

## 2015-03-22 MED ORDER — AZITHROMYCIN 250 MG PO TABS: ORAL_TABLET | ORAL | Status: DC

## 2015-03-22 NOTE — Addendum Note (Signed)
Addended by: Eual Fines on: 03/22/2015 10:41 AM   Modules accepted: Orders

## 2015-03-22 NOTE — Telephone Encounter (Signed)
States she got sick from [place]. Coughing up yellow thick phlegm and body aches. Maybe a low grade fever. Has been going on for a week or so. Feels worse this am. Has been using robitussin with not much relief. Please advise [facility]ses [facility]

## 2015-03-22 NOTE — Telephone Encounter (Signed)
ls send z pack x 1 , and offer appt in next 2 days if worsens

## 2015-03-22 NOTE — Telephone Encounter (Signed)
Med sent and patient aware

## 2015-03-23 ENCOUNTER — Other Ambulatory Visit: Payer: Self-pay | Admitting: Family Medicine

## 2015-03-23 ENCOUNTER — Other Ambulatory Visit: Payer: Self-pay

## 2015-03-23 ENCOUNTER — Encounter: Payer: PRIVATE HEALTH INSURANCE | Admitting: Family Medicine

## 2015-03-23 DIAGNOSIS — I129 Hypertensive chronic kidney disease with stage 1 through stage 4 chronic kidney disease, or unspecified chronic kidney disease: Secondary | ICD-10-CM | POA: Diagnosis not present

## 2015-03-23 DIAGNOSIS — Z48812 Encounter for surgical aftercare following surgery on the circulatory system: Secondary | ICD-10-CM | POA: Diagnosis not present

## 2015-03-23 DIAGNOSIS — I429 Cardiomyopathy, unspecified: Secondary | ICD-10-CM | POA: Diagnosis not present

## 2015-03-23 DIAGNOSIS — I5020 Unspecified systolic (congestive) heart failure: Secondary | ICD-10-CM | POA: Diagnosis not present

## 2015-03-23 DIAGNOSIS — N181 Chronic kidney disease, stage 1: Secondary | ICD-10-CM | POA: Diagnosis not present

## 2015-03-23 DIAGNOSIS — E119 Type 2 diabetes mellitus without complications: Secondary | ICD-10-CM | POA: Diagnosis not present

## 2015-03-23 MED ORDER — ONDANSETRON HCL 4 MG PO TABS: 4.0000 mg | ORAL_TABLET | Freq: Every day | ORAL | Status: DC | PRN

## 2015-03-24 ENCOUNTER — Encounter: Payer: Self-pay | Admitting: Cardiovascular Disease

## 2015-03-25 ENCOUNTER — Telehealth: Payer: Self-pay | Admitting: Internal Medicine

## 2015-03-25 DIAGNOSIS — M25561 Pain in right knee: Secondary | ICD-10-CM | POA: Diagnosis not present

## 2015-03-25 DIAGNOSIS — I129 Hypertensive chronic kidney disease with stage 1 through stage 4 chronic kidney disease, or unspecified chronic kidney disease: Secondary | ICD-10-CM | POA: Diagnosis not present

## 2015-03-25 DIAGNOSIS — Z48812 Encounter for surgical aftercare following surgery on the circulatory system: Secondary | ICD-10-CM | POA: Diagnosis not present

## 2015-03-25 DIAGNOSIS — I5020 Unspecified systolic (congestive) heart failure: Secondary | ICD-10-CM | POA: Diagnosis not present

## 2015-03-25 DIAGNOSIS — E119 Type 2 diabetes mellitus without complications: Secondary | ICD-10-CM | POA: Diagnosis not present

## 2015-03-25 DIAGNOSIS — I429 Cardiomyopathy, unspecified: Secondary | ICD-10-CM | POA: Diagnosis not present

## 2015-03-25 DIAGNOSIS — Z6835 Body mass index (BMI) 35.0-35.9, adult: Secondary | ICD-10-CM | POA: Diagnosis not present

## 2015-03-25 DIAGNOSIS — N181 Chronic kidney disease, stage 1: Secondary | ICD-10-CM | POA: Diagnosis not present

## 2015-03-25 NOTE — Telephone Encounter (Signed)
lmom this time okay to do 3 more weeks and let her know I was unable to leave message earlier

## 2015-03-25 NOTE — Telephone Encounter (Signed)
New Message  Amy the Physical Therapst with [facility] called requests a to have the nurse to please call to confirm additional 3 weeks of Therapy. Pt has been sick and request extended time to assist.

## 2015-03-25 NOTE — Telephone Encounter (Signed)
Tried to call [facility] back to discuss.  Unable to leave a message as her VM is full

## 2015-03-30 ENCOUNTER — Telehealth: Payer: Self-pay | Admitting: *Deleted

## 2015-03-30 ENCOUNTER — Other Ambulatory Visit: Payer: Self-pay | Admitting: *Deleted

## 2015-03-30 DIAGNOSIS — R059 Cough, unspecified: Secondary | ICD-10-CM

## 2015-03-30 DIAGNOSIS — Z48812 Encounter for surgical aftercare following surgery on the circulatory system: Secondary | ICD-10-CM | POA: Diagnosis not present

## 2015-03-30 DIAGNOSIS — I5020 Unspecified systolic (congestive) heart failure: Secondary | ICD-10-CM | POA: Diagnosis not present

## 2015-03-30 DIAGNOSIS — I129 Hypertensive chronic kidney disease with stage 1 through stage 4 chronic kidney disease, or unspecified chronic kidney disease: Secondary | ICD-10-CM | POA: Diagnosis not present

## 2015-03-30 DIAGNOSIS — I429 Cardiomyopathy, unspecified: Secondary | ICD-10-CM | POA: Diagnosis not present

## 2015-03-30 DIAGNOSIS — E119 Type 2 diabetes mellitus without complications: Secondary | ICD-10-CM | POA: Diagnosis not present

## 2015-03-30 DIAGNOSIS — R05 Cough: Secondary | ICD-10-CM

## 2015-03-30 DIAGNOSIS — N181 Chronic kidney disease, stage 1: Secondary | ICD-10-CM | POA: Diagnosis not present

## 2015-03-30 MED ORDER — ISOSORBIDE DINITRATE ER 40 MG PO TBCR: EXTENDED_RELEASE_TABLET | ORAL | Status: DC

## 2015-03-30 MED ORDER — PROMETHAZINE-DM 6.25-15 MG/5ML PO SYRP: 5.0000 mL | ORAL_SOLUTION | Freq: Every evening | ORAL | Status: DC | PRN

## 2015-03-30 NOTE — Telephone Encounter (Signed)
Pt called requesting to be seen, pt states she is not any better pt states Dr. [name] gave her a antibiotic and she is still not any better, pt states she can't cough that stuff up. Please advise

## 2015-03-30 NOTE — Telephone Encounter (Signed)
Patient aware and med sent  

## 2015-03-30 NOTE — Addendum Note (Signed)
Addended by: Denman George B on: 03/30/2015 04:37 PM   Modules accepted: Orders

## 2015-03-30 NOTE — Telephone Encounter (Signed)
Patient states that she has completed ABT.  She is still having problems with cough and is unable to produce sputum.  Please advise.  Is asking for cough syrup.

## 2015-03-30 NOTE — Telephone Encounter (Signed)
pls order cxr pa and lateral, also phenergan dm one tsp at bedtime as needed x 118 ml, also offer  an appt for this Thursday,

## 2015-03-31 ENCOUNTER — Encounter (HOSPITAL_BASED_OUTPATIENT_CLINIC_OR_DEPARTMENT_OTHER): Payer: Medicare Other

## 2015-03-31 ENCOUNTER — Encounter (HOSPITAL_COMMUNITY): Payer: Medicare Other | Attending: Internal Medicine

## 2015-03-31 ENCOUNTER — Ambulatory Visit (HOSPITAL_COMMUNITY)
Admission: RE | Admit: 2015-03-31 | Discharge: 2015-03-31 | Disposition: A | Discharge status: Home / Self Care | Payer: Medicare Other | Source: Ambulatory Visit | Attending: Family Medicine | Admitting: Family Medicine

## 2015-03-31 DIAGNOSIS — I509 Heart failure, unspecified: Secondary | ICD-10-CM | POA: Diagnosis not present

## 2015-03-31 DIAGNOSIS — Z952 Presence of prosthetic heart valve: Secondary | ICD-10-CM | POA: Diagnosis not present

## 2015-03-31 DIAGNOSIS — D638 Anemia in other chronic diseases classified elsewhere: Secondary | ICD-10-CM | POA: Diagnosis not present

## 2015-03-31 DIAGNOSIS — D649 Anemia, unspecified: Secondary | ICD-10-CM | POA: Diagnosis present

## 2015-03-31 DIAGNOSIS — E611 Iron deficiency: Secondary | ICD-10-CM | POA: Insufficient documentation

## 2015-03-31 DIAGNOSIS — R059 Cough, unspecified: Secondary | ICD-10-CM

## 2015-03-31 DIAGNOSIS — Z95810 Presence of automatic (implantable) cardiac defibrillator: Secondary | ICD-10-CM | POA: Insufficient documentation

## 2015-03-31 DIAGNOSIS — R05 Cough: Secondary | ICD-10-CM | POA: Diagnosis not present

## 2015-03-31 LAB — CBC
HCT: 32.5 % — ABNORMAL LOW (ref 36.0–46.0)
Hemoglobin: 10.6 g/dL — ABNORMAL LOW (ref 12.0–15.0)
MCH: 31.9 pg (ref 26.0–34.0)
MCHC: 32.6 g/dL (ref 30.0–36.0)
MCV: 97.9 fL (ref 78.0–100.0)
Platelets: 228 10*3/uL (ref 150–400)
RBC: 3.32 MIL/uL — ABNORMAL LOW (ref 3.87–5.11)
RDW: 15.9 % — ABNORMAL HIGH (ref 11.5–15.5)
WBC: 6.1 10*3/uL (ref 4.0–10.5)

## 2015-03-31 NOTE — Progress Notes (Signed)
LABS DRAWN

## 2015-03-31 NOTE — Progress Notes (Signed)
Procrit held today to return next week after discussing hemoglobin/frequency of injection

## 2015-04-01 DIAGNOSIS — I5020 Unspecified systolic (congestive) heart failure: Secondary | ICD-10-CM | POA: Diagnosis not present

## 2015-04-01 DIAGNOSIS — Z48812 Encounter for surgical aftercare following surgery on the circulatory system: Secondary | ICD-10-CM | POA: Diagnosis not present

## 2015-04-01 DIAGNOSIS — I129 Hypertensive chronic kidney disease with stage 1 through stage 4 chronic kidney disease, or unspecified chronic kidney disease: Secondary | ICD-10-CM | POA: Diagnosis not present

## 2015-04-01 DIAGNOSIS — I429 Cardiomyopathy, unspecified: Secondary | ICD-10-CM | POA: Diagnosis not present

## 2015-04-01 DIAGNOSIS — E119 Type 2 diabetes mellitus without complications: Secondary | ICD-10-CM | POA: Diagnosis not present

## 2015-04-01 DIAGNOSIS — N181 Chronic kidney disease, stage 1: Secondary | ICD-10-CM | POA: Diagnosis not present

## 2015-04-02 ENCOUNTER — Other Ambulatory Visit (HOSPITAL_COMMUNITY): Payer: Self-pay

## 2015-04-05 ENCOUNTER — Ambulatory Visit (INDEPENDENT_AMBULATORY_CARE_PROVIDER_SITE_OTHER): Payer: Medicare Other | Admitting: *Deleted

## 2015-04-05 DIAGNOSIS — Z954 Presence of other heart-valve replacement: Secondary | ICD-10-CM | POA: Diagnosis not present

## 2015-04-05 DIAGNOSIS — Z5181 Encounter for therapeutic drug level monitoring: Secondary | ICD-10-CM

## 2015-04-05 DIAGNOSIS — Z7901 Long term (current) use of anticoagulants: Secondary | ICD-10-CM

## 2015-04-05 DIAGNOSIS — Z952 Presence of prosthetic heart valve: Secondary | ICD-10-CM

## 2015-04-05 LAB — POCT INR: INR: 1.4

## 2015-04-06 DIAGNOSIS — I5020 Unspecified systolic (congestive) heart failure: Secondary | ICD-10-CM | POA: Diagnosis not present

## 2015-04-06 DIAGNOSIS — N181 Chronic kidney disease, stage 1: Secondary | ICD-10-CM | POA: Diagnosis not present

## 2015-04-06 DIAGNOSIS — E119 Type 2 diabetes mellitus without complications: Secondary | ICD-10-CM | POA: Diagnosis not present

## 2015-04-06 DIAGNOSIS — I129 Hypertensive chronic kidney disease with stage 1 through stage 4 chronic kidney disease, or unspecified chronic kidney disease: Secondary | ICD-10-CM | POA: Diagnosis not present

## 2015-04-06 DIAGNOSIS — I429 Cardiomyopathy, unspecified: Secondary | ICD-10-CM | POA: Diagnosis not present

## 2015-04-06 DIAGNOSIS — Z48812 Encounter for surgical aftercare following surgery on the circulatory system: Secondary | ICD-10-CM | POA: Diagnosis not present

## 2015-04-07 DIAGNOSIS — I129 Hypertensive chronic kidney disease with stage 1 through stage 4 chronic kidney disease, or unspecified chronic kidney disease: Secondary | ICD-10-CM | POA: Diagnosis not present

## 2015-04-07 DIAGNOSIS — E119 Type 2 diabetes mellitus without complications: Secondary | ICD-10-CM | POA: Diagnosis not present

## 2015-04-07 DIAGNOSIS — I429 Cardiomyopathy, unspecified: Secondary | ICD-10-CM | POA: Diagnosis not present

## 2015-04-07 DIAGNOSIS — N181 Chronic kidney disease, stage 1: Secondary | ICD-10-CM | POA: Diagnosis not present

## 2015-04-07 DIAGNOSIS — I5020 Unspecified systolic (congestive) heart failure: Secondary | ICD-10-CM | POA: Diagnosis not present

## 2015-04-07 DIAGNOSIS — Z48812 Encounter for surgical aftercare following surgery on the circulatory system: Secondary | ICD-10-CM | POA: Diagnosis not present

## 2015-04-08 ENCOUNTER — Encounter (HOSPITAL_BASED_OUTPATIENT_CLINIC_OR_DEPARTMENT_OTHER): Payer: Medicare Other

## 2015-04-08 VITALS — BP 140/56 | HR 67 | Temp 98.2°F | Resp 18

## 2015-04-08 DIAGNOSIS — D649 Anemia, unspecified: Secondary | ICD-10-CM

## 2015-04-08 DIAGNOSIS — N184 Chronic kidney disease, stage 4 (severe): Secondary | ICD-10-CM

## 2015-04-08 DIAGNOSIS — E611 Iron deficiency: Secondary | ICD-10-CM | POA: Diagnosis not present

## 2015-04-08 DIAGNOSIS — E119 Type 2 diabetes mellitus without complications: Secondary | ICD-10-CM | POA: Diagnosis not present

## 2015-04-08 DIAGNOSIS — I5020 Unspecified systolic (congestive) heart failure: Secondary | ICD-10-CM | POA: Diagnosis not present

## 2015-04-08 DIAGNOSIS — Z48812 Encounter for surgical aftercare following surgery on the circulatory system: Secondary | ICD-10-CM | POA: Diagnosis not present

## 2015-04-08 DIAGNOSIS — D638 Anemia in other chronic diseases classified elsewhere: Secondary | ICD-10-CM | POA: Diagnosis not present

## 2015-04-08 DIAGNOSIS — I129 Hypertensive chronic kidney disease with stage 1 through stage 4 chronic kidney disease, or unspecified chronic kidney disease: Secondary | ICD-10-CM | POA: Diagnosis not present

## 2015-04-08 DIAGNOSIS — I429 Cardiomyopathy, unspecified: Secondary | ICD-10-CM | POA: Diagnosis not present

## 2015-04-08 DIAGNOSIS — N181 Chronic kidney disease, stage 1: Secondary | ICD-10-CM | POA: Diagnosis not present

## 2015-04-08 LAB — CBC
HCT: 30.3 % — ABNORMAL LOW (ref 36.0–46.0)
Hemoglobin: 10.2 g/dL — ABNORMAL LOW (ref 12.0–15.0)
MCH: 32 pg (ref 26.0–34.0)
MCHC: 33.7 g/dL (ref 30.0–36.0)
MCV: 95 fL (ref 78.0–100.0)
Platelets: 188 10*3/uL (ref 150–400)
RBC: 3.19 MIL/uL — ABNORMAL LOW (ref 3.87–5.11)
RDW: 14.8 % (ref 11.5–15.5)
WBC: 4.6 10*3/uL (ref 4.0–10.5)

## 2015-04-08 MED ORDER — EPOETIN ALFA 40000 UNIT/ML IJ SOLN
INTRAMUSCULAR | Status: AC
  Filled 2015-04-08: qty 1

## 2015-04-08 MED ORDER — EPOETIN ALFA 40000 UNIT/ML IJ SOLN
40000.0000 [IU] | Freq: Once | INTRAMUSCULAR | Status: AC
  Administered 2015-04-08: 40000 [IU] via SUBCUTANEOUS

## 2015-04-08 NOTE — Progress Notes (Signed)
[name] [name] presents today for injection per MD orders. Procrit 40,000 units administered SQ in right Abdomen. Administration without incident. Patient tolerated well.  

## 2015-04-08 NOTE — Progress Notes (Signed)
LABS DRAWN

## 2015-04-09 ENCOUNTER — Encounter (HOSPITAL_COMMUNITY): Payer: Medicare Other

## 2015-04-09 ENCOUNTER — Encounter (HOSPITAL_COMMUNITY): Payer: PRIVATE HEALTH INSURANCE

## 2015-04-12 ENCOUNTER — Ambulatory Visit (INDEPENDENT_AMBULATORY_CARE_PROVIDER_SITE_OTHER): Payer: Medicare Other | Admitting: *Deleted

## 2015-04-12 ENCOUNTER — Telehealth: Payer: Self-pay | Admitting: *Deleted

## 2015-04-12 DIAGNOSIS — I429 Cardiomyopathy, unspecified: Secondary | ICD-10-CM | POA: Diagnosis not present

## 2015-04-12 DIAGNOSIS — I129 Hypertensive chronic kidney disease with stage 1 through stage 4 chronic kidney disease, or unspecified chronic kidney disease: Secondary | ICD-10-CM | POA: Diagnosis not present

## 2015-04-12 DIAGNOSIS — E119 Type 2 diabetes mellitus without complications: Secondary | ICD-10-CM | POA: Diagnosis not present

## 2015-04-12 DIAGNOSIS — Z7901 Long term (current) use of anticoagulants: Secondary | ICD-10-CM | POA: Diagnosis not present

## 2015-04-12 DIAGNOSIS — Z5181 Encounter for therapeutic drug level monitoring: Secondary | ICD-10-CM | POA: Diagnosis not present

## 2015-04-12 DIAGNOSIS — I5020 Unspecified systolic (congestive) heart failure: Secondary | ICD-10-CM | POA: Diagnosis not present

## 2015-04-12 DIAGNOSIS — Z952 Presence of prosthetic heart valve: Secondary | ICD-10-CM

## 2015-04-12 DIAGNOSIS — Z48812 Encounter for surgical aftercare following surgery on the circulatory system: Secondary | ICD-10-CM | POA: Diagnosis not present

## 2015-04-12 DIAGNOSIS — Z954 Presence of other heart-valve replacement: Secondary | ICD-10-CM

## 2015-04-12 DIAGNOSIS — N181 Chronic kidney disease, stage 1: Secondary | ICD-10-CM | POA: Diagnosis not present

## 2015-04-12 LAB — POCT INR: INR: 1.5

## 2015-04-12 MED ORDER — TOPROL XL 100 MG PO TB24: 100.0000 mg | ORAL_TABLET | Freq: Every day | ORAL | Status: DC

## 2015-04-12 NOTE — Telephone Encounter (Signed)
Med sent in.

## 2015-04-12 NOTE — Telephone Encounter (Signed)
Pt called stating [facility] told her to call and get a refill on her toperx pt needs enough until September, pt is completely out. Please advise

## 2015-04-13 DIAGNOSIS — I129 Hypertensive chronic kidney disease with stage 1 through stage 4 chronic kidney disease, or unspecified chronic kidney disease: Secondary | ICD-10-CM | POA: Diagnosis not present

## 2015-04-13 DIAGNOSIS — N181 Chronic kidney disease, stage 1: Secondary | ICD-10-CM | POA: Diagnosis not present

## 2015-04-13 DIAGNOSIS — E119 Type 2 diabetes mellitus without complications: Secondary | ICD-10-CM | POA: Diagnosis not present

## 2015-04-13 DIAGNOSIS — I5020 Unspecified systolic (congestive) heart failure: Secondary | ICD-10-CM | POA: Diagnosis not present

## 2015-04-13 DIAGNOSIS — I429 Cardiomyopathy, unspecified: Secondary | ICD-10-CM | POA: Diagnosis not present

## 2015-04-13 DIAGNOSIS — Z48812 Encounter for surgical aftercare following surgery on the circulatory system: Secondary | ICD-10-CM | POA: Diagnosis not present

## 2015-04-15 DIAGNOSIS — E119 Type 2 diabetes mellitus without complications: Secondary | ICD-10-CM | POA: Diagnosis not present

## 2015-04-15 DIAGNOSIS — J449 Chronic obstructive pulmonary disease, unspecified: Secondary | ICD-10-CM | POA: Diagnosis not present

## 2015-04-15 DIAGNOSIS — I129 Hypertensive chronic kidney disease with stage 1 through stage 4 chronic kidney disease, or unspecified chronic kidney disease: Secondary | ICD-10-CM | POA: Diagnosis not present

## 2015-04-15 DIAGNOSIS — Z950 Presence of cardiac pacemaker: Secondary | ICD-10-CM | POA: Diagnosis not present

## 2015-04-15 DIAGNOSIS — E669 Obesity, unspecified: Secondary | ICD-10-CM | POA: Diagnosis not present

## 2015-04-15 DIAGNOSIS — I5020 Unspecified systolic (congestive) heart failure: Secondary | ICD-10-CM | POA: Diagnosis not present

## 2015-04-15 DIAGNOSIS — E785 Hyperlipidemia, unspecified: Secondary | ICD-10-CM | POA: Diagnosis not present

## 2015-04-15 DIAGNOSIS — M47896 Other spondylosis, lumbar region: Secondary | ICD-10-CM | POA: Diagnosis not present

## 2015-04-15 DIAGNOSIS — I429 Cardiomyopathy, unspecified: Secondary | ICD-10-CM | POA: Diagnosis not present

## 2015-04-15 DIAGNOSIS — N181 Chronic kidney disease, stage 1: Secondary | ICD-10-CM | POA: Diagnosis not present

## 2015-04-15 DIAGNOSIS — Z87891 Personal history of nicotine dependence: Secondary | ICD-10-CM | POA: Diagnosis not present

## 2015-04-16 ENCOUNTER — Other Ambulatory Visit (HOSPITAL_COMMUNITY): Payer: Self-pay | Admitting: Surgery

## 2015-04-16 DIAGNOSIS — Z09 Encounter for follow-up examination after completed treatment for conditions other than malignant neoplasm: Secondary | ICD-10-CM

## 2015-04-16 DIAGNOSIS — Z1231 Encounter for screening mammogram for malignant neoplasm of breast: Secondary | ICD-10-CM

## 2015-04-19 ENCOUNTER — Ambulatory Visit (INDEPENDENT_AMBULATORY_CARE_PROVIDER_SITE_OTHER): Payer: Medicare Other | Admitting: *Deleted

## 2015-04-19 DIAGNOSIS — Z5181 Encounter for therapeutic drug level monitoring: Secondary | ICD-10-CM

## 2015-04-19 DIAGNOSIS — Z7901 Long term (current) use of anticoagulants: Secondary | ICD-10-CM | POA: Diagnosis not present

## 2015-04-19 DIAGNOSIS — Z954 Presence of other heart-valve replacement: Secondary | ICD-10-CM | POA: Diagnosis not present

## 2015-04-19 DIAGNOSIS — Z952 Presence of prosthetic heart valve: Secondary | ICD-10-CM

## 2015-04-19 LAB — POCT INR: INR: 2.2

## 2015-04-20 DIAGNOSIS — I429 Cardiomyopathy, unspecified: Secondary | ICD-10-CM | POA: Diagnosis not present

## 2015-04-20 DIAGNOSIS — J449 Chronic obstructive pulmonary disease, unspecified: Secondary | ICD-10-CM | POA: Diagnosis not present

## 2015-04-20 DIAGNOSIS — N181 Chronic kidney disease, stage 1: Secondary | ICD-10-CM | POA: Diagnosis not present

## 2015-04-20 DIAGNOSIS — I5020 Unspecified systolic (congestive) heart failure: Secondary | ICD-10-CM | POA: Diagnosis not present

## 2015-04-20 DIAGNOSIS — I129 Hypertensive chronic kidney disease with stage 1 through stage 4 chronic kidney disease, or unspecified chronic kidney disease: Secondary | ICD-10-CM | POA: Diagnosis not present

## 2015-04-20 DIAGNOSIS — E119 Type 2 diabetes mellitus without complications: Secondary | ICD-10-CM | POA: Diagnosis not present

## 2015-04-22 ENCOUNTER — Other Ambulatory Visit: Payer: Self-pay | Admitting: Family Medicine

## 2015-04-22 ENCOUNTER — Other Ambulatory Visit: Payer: Self-pay

## 2015-04-22 ENCOUNTER — Telehealth: Payer: Self-pay | Admitting: *Deleted

## 2015-04-22 MED ORDER — SYNTHROID 75 MCG PO TABS: ORAL_TABLET | ORAL | Status: DC

## 2015-04-22 NOTE — Telephone Encounter (Signed)
meds refilled 

## 2015-04-22 NOTE — Telephone Encounter (Signed)
Pt called stating she only has 2 pills of her thyroid medication pt said she needs a 90 day supply. Pt said she needs it to go to [facility] and she gets name brand.

## 2015-04-23 ENCOUNTER — Other Ambulatory Visit: Payer: Self-pay | Admitting: Family Medicine

## 2015-04-23 DIAGNOSIS — I429 Cardiomyopathy, unspecified: Secondary | ICD-10-CM | POA: Diagnosis not present

## 2015-04-23 DIAGNOSIS — E119 Type 2 diabetes mellitus without complications: Secondary | ICD-10-CM | POA: Diagnosis not present

## 2015-04-23 DIAGNOSIS — N181 Chronic kidney disease, stage 1: Secondary | ICD-10-CM | POA: Diagnosis not present

## 2015-04-23 DIAGNOSIS — I5020 Unspecified systolic (congestive) heart failure: Secondary | ICD-10-CM | POA: Diagnosis not present

## 2015-04-23 DIAGNOSIS — J449 Chronic obstructive pulmonary disease, unspecified: Secondary | ICD-10-CM | POA: Diagnosis not present

## 2015-04-23 DIAGNOSIS — I129 Hypertensive chronic kidney disease with stage 1 through stage 4 chronic kidney disease, or unspecified chronic kidney disease: Secondary | ICD-10-CM | POA: Diagnosis not present

## 2015-04-27 ENCOUNTER — Ambulatory Visit (HOSPITAL_COMMUNITY)
Admission: RE | Admit: 2015-04-27 | Discharge: 2015-04-27 | Disposition: A | Discharge status: Home / Self Care | Payer: Medicare Other | Source: Ambulatory Visit | Attending: Surgery | Admitting: Surgery

## 2015-04-27 ENCOUNTER — Other Ambulatory Visit (HOSPITAL_COMMUNITY): Payer: Self-pay | Admitting: Surgery

## 2015-04-27 DIAGNOSIS — E119 Type 2 diabetes mellitus without complications: Secondary | ICD-10-CM | POA: Diagnosis not present

## 2015-04-27 DIAGNOSIS — I5020 Unspecified systolic (congestive) heart failure: Secondary | ICD-10-CM | POA: Diagnosis not present

## 2015-04-27 DIAGNOSIS — J449 Chronic obstructive pulmonary disease, unspecified: Secondary | ICD-10-CM | POA: Diagnosis not present

## 2015-04-27 DIAGNOSIS — Z09 Encounter for follow-up examination after completed treatment for conditions other than malignant neoplasm: Secondary | ICD-10-CM

## 2015-04-27 DIAGNOSIS — R922 Inconclusive mammogram: Secondary | ICD-10-CM | POA: Diagnosis not present

## 2015-04-27 DIAGNOSIS — R928 Other abnormal and inconclusive findings on diagnostic imaging of breast: Secondary | ICD-10-CM | POA: Diagnosis not present

## 2015-04-27 DIAGNOSIS — N181 Chronic kidney disease, stage 1: Secondary | ICD-10-CM | POA: Diagnosis not present

## 2015-04-27 DIAGNOSIS — I429 Cardiomyopathy, unspecified: Secondary | ICD-10-CM | POA: Diagnosis not present

## 2015-04-27 DIAGNOSIS — I129 Hypertensive chronic kidney disease with stage 1 through stage 4 chronic kidney disease, or unspecified chronic kidney disease: Secondary | ICD-10-CM | POA: Diagnosis not present

## 2015-04-29 ENCOUNTER — Encounter (HOSPITAL_COMMUNITY): Payer: Medicare Other | Attending: Hematology & Oncology

## 2015-04-29 ENCOUNTER — Ambulatory Visit (HOSPITAL_COMMUNITY): Payer: Medicare Other

## 2015-04-29 VITALS — BP 133/55 | HR 69 | Temp 97.9°F | Resp 20

## 2015-04-29 DIAGNOSIS — E119 Type 2 diabetes mellitus without complications: Secondary | ICD-10-CM | POA: Diagnosis not present

## 2015-04-29 DIAGNOSIS — I129 Hypertensive chronic kidney disease with stage 1 through stage 4 chronic kidney disease, or unspecified chronic kidney disease: Secondary | ICD-10-CM | POA: Diagnosis not present

## 2015-04-29 DIAGNOSIS — D649 Anemia, unspecified: Secondary | ICD-10-CM | POA: Diagnosis present

## 2015-04-29 DIAGNOSIS — J449 Chronic obstructive pulmonary disease, unspecified: Secondary | ICD-10-CM | POA: Diagnosis not present

## 2015-04-29 DIAGNOSIS — N184 Chronic kidney disease, stage 4 (severe): Secondary | ICD-10-CM | POA: Diagnosis not present

## 2015-04-29 DIAGNOSIS — N181 Chronic kidney disease, stage 1: Secondary | ICD-10-CM | POA: Diagnosis not present

## 2015-04-29 DIAGNOSIS — I5020 Unspecified systolic (congestive) heart failure: Secondary | ICD-10-CM | POA: Diagnosis not present

## 2015-04-29 DIAGNOSIS — I429 Cardiomyopathy, unspecified: Secondary | ICD-10-CM | POA: Diagnosis not present

## 2015-04-29 LAB — CBC
HCT: 33.7 % — ABNORMAL LOW (ref 36.0–46.0)
Hemoglobin: 11 g/dL — ABNORMAL LOW (ref 12.0–15.0)
MCH: 31.7 pg (ref 26.0–34.0)
MCHC: 32.6 g/dL (ref 30.0–36.0)
MCV: 97.1 fL (ref 78.0–100.0)
Platelets: 211 10*3/uL (ref 150–400)
RBC: 3.47 MIL/uL — ABNORMAL LOW (ref 3.87–5.11)
RDW: 14.6 % (ref 11.5–15.5)
WBC: 5.3 10*3/uL (ref 4.0–10.5)

## 2015-04-29 MED ORDER — EPOETIN ALFA 40000 UNIT/ML IJ SOLN
INTRAMUSCULAR | Status: AC
  Filled 2015-04-29: qty 1

## 2015-04-29 MED ORDER — EPOETIN ALFA 40000 UNIT/ML IJ SOLN
40000.0000 [IU] | Freq: Once | INTRAMUSCULAR | Status: AC
Start: 2015-04-29 — End: 2015-04-29
  Administered 2015-04-29: 40000 [IU] via SUBCUTANEOUS

## 2015-04-29 NOTE — Patient Instructions (Signed)
[place] at [facility] Discharge Instructions  RECOMMENDATIONS MADE BY THE CONSULTANT AND ANY TEST RESULTS WILL BE SENT TO YOUR REFERRING PHYSICIAN.  Hemoglobin 11.0 today. Procrit 40,000 units injection given today as ordered. Return as scheduled.  Thank you for choosing [place] at [facility] to provide your oncology and hematology care.  To afford each patient quality time with our provider, please arrive at least 15 minutes before your scheduled appointment time.    You need to re-schedule your appointment should you arrive 10 or more minutes late.  We strive to give you quality time with our providers, and arriving late affects you and other patients whose appointments are after yours.  Also, if you no show three or more times for appointments you may be dismissed from the clinic at the providers discretion.     Again, thank you for choosing [facility].  Our hope is that these requests will decrease the amount of time that you wait before being seen by our physicians.       _____________________________________________________________  Should you have questions after your visit to [facility], please contact our office at [phone] between the hours of 8:30 a.m. and 4:30 p.m.  Voicemails left after 4:30 p.m. will not be returned until the following business day.  For prescription refill requests, have your pharmacy contact our office.

## 2015-04-29 NOTE — Progress Notes (Signed)
Elfriede Karapetyan presents today for injection per MD orders. Procrit 40,000 units administered SQ in right Abdomen. Administration without incident. Patient tolerated well.  

## 2015-04-29 NOTE — Progress Notes (Signed)
Labs drawn

## 2015-04-30 ENCOUNTER — Other Ambulatory Visit: Payer: Self-pay | Admitting: Family Medicine

## 2015-04-30 ENCOUNTER — Encounter (HOSPITAL_COMMUNITY): Payer: PRIVATE HEALTH INSURANCE

## 2015-04-30 ENCOUNTER — Encounter (HOSPITAL_COMMUNITY): Payer: Medicare Other

## 2015-05-03 ENCOUNTER — Ambulatory Visit (INDEPENDENT_AMBULATORY_CARE_PROVIDER_SITE_OTHER): Payer: Medicare Other | Admitting: *Deleted

## 2015-05-03 ENCOUNTER — Telehealth: Payer: Self-pay | Admitting: Internal Medicine

## 2015-05-03 DIAGNOSIS — Z952 Presence of prosthetic heart valve: Secondary | ICD-10-CM

## 2015-05-03 DIAGNOSIS — Z7901 Long term (current) use of anticoagulants: Secondary | ICD-10-CM | POA: Diagnosis not present

## 2015-05-03 DIAGNOSIS — Z5181 Encounter for therapeutic drug level monitoring: Secondary | ICD-10-CM | POA: Diagnosis not present

## 2015-05-03 DIAGNOSIS — Z954 Presence of other heart-valve replacement: Secondary | ICD-10-CM

## 2015-05-03 LAB — POCT INR: INR: 2.9

## 2015-05-03 NOTE — Telephone Encounter (Signed)
New Message  Are you waiting on a nurse to call you back to confirm if orders were received. YES! Anderson Malta from [facility] calling to determine if Interim orders were received. Order # ends with [id number]. Last sent on 03/18/2015. Please let them know if you have received it and please return.

## 2015-05-03 NOTE — Telephone Encounter (Signed)
The number that was given to me is correct but wrong extension  They have tried to get me to the correct location but no luck.  I have left a message with the department that I was transferred to.  I left that I have no outstanding orders and to refax

## 2015-05-04 DIAGNOSIS — E119 Type 2 diabetes mellitus without complications: Secondary | ICD-10-CM | POA: Diagnosis not present

## 2015-05-04 DIAGNOSIS — N181 Chronic kidney disease, stage 1: Secondary | ICD-10-CM | POA: Diagnosis not present

## 2015-05-04 DIAGNOSIS — I5020 Unspecified systolic (congestive) heart failure: Secondary | ICD-10-CM | POA: Diagnosis not present

## 2015-05-04 DIAGNOSIS — I129 Hypertensive chronic kidney disease with stage 1 through stage 4 chronic kidney disease, or unspecified chronic kidney disease: Secondary | ICD-10-CM | POA: Diagnosis not present

## 2015-05-04 DIAGNOSIS — I429 Cardiomyopathy, unspecified: Secondary | ICD-10-CM | POA: Diagnosis not present

## 2015-05-04 DIAGNOSIS — J449 Chronic obstructive pulmonary disease, unspecified: Secondary | ICD-10-CM | POA: Diagnosis not present

## 2015-05-06 DIAGNOSIS — I5020 Unspecified systolic (congestive) heart failure: Secondary | ICD-10-CM | POA: Diagnosis not present

## 2015-05-06 DIAGNOSIS — E119 Type 2 diabetes mellitus without complications: Secondary | ICD-10-CM | POA: Diagnosis not present

## 2015-05-06 DIAGNOSIS — J449 Chronic obstructive pulmonary disease, unspecified: Secondary | ICD-10-CM | POA: Diagnosis not present

## 2015-05-06 DIAGNOSIS — N181 Chronic kidney disease, stage 1: Secondary | ICD-10-CM | POA: Diagnosis not present

## 2015-05-06 DIAGNOSIS — I129 Hypertensive chronic kidney disease with stage 1 through stage 4 chronic kidney disease, or unspecified chronic kidney disease: Secondary | ICD-10-CM | POA: Diagnosis not present

## 2015-05-06 DIAGNOSIS — I429 Cardiomyopathy, unspecified: Secondary | ICD-10-CM | POA: Diagnosis not present

## 2015-05-11 DIAGNOSIS — E079 Disorder of thyroid, unspecified: Secondary | ICD-10-CM | POA: Diagnosis not present

## 2015-05-11 DIAGNOSIS — Z6835 Body mass index (BMI) 35.0-35.9, adult: Secondary | ICD-10-CM | POA: Diagnosis not present

## 2015-05-11 DIAGNOSIS — Z8719 Personal history of other diseases of the digestive system: Secondary | ICD-10-CM | POA: Diagnosis not present

## 2015-05-11 DIAGNOSIS — E119 Type 2 diabetes mellitus without complications: Secondary | ICD-10-CM | POA: Diagnosis not present

## 2015-05-11 DIAGNOSIS — M25561 Pain in right knee: Secondary | ICD-10-CM | POA: Diagnosis not present

## 2015-05-11 DIAGNOSIS — D649 Anemia, unspecified: Secondary | ICD-10-CM | POA: Diagnosis not present

## 2015-05-12 DIAGNOSIS — I129 Hypertensive chronic kidney disease with stage 1 through stage 4 chronic kidney disease, or unspecified chronic kidney disease: Secondary | ICD-10-CM | POA: Diagnosis not present

## 2015-05-12 DIAGNOSIS — N181 Chronic kidney disease, stage 1: Secondary | ICD-10-CM | POA: Diagnosis not present

## 2015-05-12 DIAGNOSIS — I429 Cardiomyopathy, unspecified: Secondary | ICD-10-CM | POA: Diagnosis not present

## 2015-05-12 DIAGNOSIS — E119 Type 2 diabetes mellitus without complications: Secondary | ICD-10-CM | POA: Diagnosis not present

## 2015-05-12 DIAGNOSIS — I5020 Unspecified systolic (congestive) heart failure: Secondary | ICD-10-CM | POA: Diagnosis not present

## 2015-05-12 DIAGNOSIS — J449 Chronic obstructive pulmonary disease, unspecified: Secondary | ICD-10-CM | POA: Diagnosis not present

## 2015-05-14 ENCOUNTER — Ambulatory Visit (HOSPITAL_COMMUNITY): Payer: PRIVATE HEALTH INSURANCE | Admitting: Hematology & Oncology

## 2015-05-18 ENCOUNTER — Telehealth: Payer: Self-pay | Admitting: *Deleted

## 2015-05-18 MED ORDER — ONDANSETRON HCL 4 MG PO TABS: 4.0000 mg | ORAL_TABLET | Freq: Every day | ORAL | Status: DC | PRN

## 2015-05-18 NOTE — Telephone Encounter (Signed)
Pt called requesting the pills she can take for being sick on her stomach, pt said Dr. [name] will know what she is talking about.

## 2015-05-18 NOTE — Telephone Encounter (Signed)
Med sent.

## 2015-05-19 ENCOUNTER — Encounter (HOSPITAL_BASED_OUTPATIENT_CLINIC_OR_DEPARTMENT_OTHER): Payer: Medicare Other

## 2015-05-19 ENCOUNTER — Encounter (HOSPITAL_COMMUNITY): Payer: Medicare Other | Attending: Internal Medicine

## 2015-05-19 DIAGNOSIS — D649 Anemia, unspecified: Secondary | ICD-10-CM

## 2015-05-19 DIAGNOSIS — E611 Iron deficiency: Secondary | ICD-10-CM | POA: Insufficient documentation

## 2015-05-19 DIAGNOSIS — D638 Anemia in other chronic diseases classified elsewhere: Secondary | ICD-10-CM | POA: Diagnosis not present

## 2015-05-19 LAB — CBC
HCT: 35.4 % — ABNORMAL LOW (ref 36.0–46.0)
Hemoglobin: 11.8 g/dL — ABNORMAL LOW (ref 12.0–15.0)
MCH: 31.9 pg (ref 26.0–34.0)
MCHC: 33.3 g/dL (ref 30.0–36.0)
MCV: 95.7 fL (ref 78.0–100.0)
Platelets: 209 10*3/uL (ref 150–400)
RBC: 3.7 MIL/uL — ABNORMAL LOW (ref 3.87–5.11)
RDW: 14.4 % (ref 11.5–15.5)
WBC: 7.2 10*3/uL (ref 4.0–10.5)

## 2015-05-19 NOTE — Progress Notes (Signed)
Hemoglobin 11.8 no injection needed

## 2015-05-19 NOTE — Progress Notes (Signed)
Labs drawn

## 2015-05-21 ENCOUNTER — Encounter (HOSPITAL_COMMUNITY): Payer: Medicare Other

## 2015-05-21 ENCOUNTER — Ambulatory Visit (HOSPITAL_COMMUNITY): Payer: PRIVATE HEALTH INSURANCE | Admitting: Hematology & Oncology

## 2015-05-21 ENCOUNTER — Other Ambulatory Visit (HOSPITAL_COMMUNITY): Payer: Medicare Other

## 2015-05-21 ENCOUNTER — Encounter (HOSPITAL_COMMUNITY): Payer: PRIVATE HEALTH INSURANCE

## 2015-05-24 DIAGNOSIS — N6452 Nipple discharge: Secondary | ICD-10-CM | POA: Diagnosis not present

## 2015-05-26 ENCOUNTER — Telehealth: Payer: Self-pay | Admitting: Cardiovascular Disease

## 2015-05-26 DIAGNOSIS — N181 Chronic kidney disease, stage 1: Secondary | ICD-10-CM | POA: Diagnosis not present

## 2015-05-26 DIAGNOSIS — I5020 Unspecified systolic (congestive) heart failure: Secondary | ICD-10-CM | POA: Diagnosis not present

## 2015-05-26 DIAGNOSIS — E119 Type 2 diabetes mellitus without complications: Secondary | ICD-10-CM | POA: Diagnosis not present

## 2015-05-26 DIAGNOSIS — I429 Cardiomyopathy, unspecified: Secondary | ICD-10-CM | POA: Diagnosis not present

## 2015-05-26 DIAGNOSIS — I129 Hypertensive chronic kidney disease with stage 1 through stage 4 chronic kidney disease, or unspecified chronic kidney disease: Secondary | ICD-10-CM | POA: Diagnosis not present

## 2015-05-26 DIAGNOSIS — J449 Chronic obstructive pulmonary disease, unspecified: Secondary | ICD-10-CM | POA: Diagnosis not present

## 2015-05-26 NOTE — Telephone Encounter (Signed)
Returned call to [facility]. Pt weight apparently up 3+ lbs this week, pt "a little short of breath". Takes zaroxolyn once weekly - took today. Also taking 40mg  lasix daily AM. Pt BP 138/68 w HR 76 Advised extra dose of lasix this afternoon, tomorrow afternoon - call Friday if not improved.   Routing to Dr. Sallyanne Kuster for additional advice.

## 2015-05-26 NOTE — Telephone Encounter (Signed)
Needs to speak with DrLurline Del nurse.  Patient has had weight gain and is now short of breath.

## 2015-05-27 ENCOUNTER — Other Ambulatory Visit: Payer: Self-pay | Admitting: *Deleted

## 2015-05-27 ENCOUNTER — Other Ambulatory Visit: Payer: Self-pay | Admitting: Family Medicine

## 2015-05-27 ENCOUNTER — Other Ambulatory Visit: Payer: Self-pay | Admitting: Internal Medicine

## 2015-05-27 MED ORDER — WARFARIN SODIUM 5 MG PO TABS: ORAL_TABLET | ORAL | Status: DC

## 2015-05-27 NOTE — Telephone Encounter (Signed)
RX SENT IN PRIOR ENCOUNTER

## 2015-05-27 NOTE — Telephone Encounter (Signed)
Refills complete 

## 2015-06-01 ENCOUNTER — Other Ambulatory Visit: Payer: Self-pay

## 2015-06-01 DIAGNOSIS — L603 Nail dystrophy: Secondary | ICD-10-CM | POA: Diagnosis not present

## 2015-06-01 DIAGNOSIS — E875 Hyperkalemia: Secondary | ICD-10-CM | POA: Diagnosis not present

## 2015-06-01 DIAGNOSIS — E1142 Type 2 diabetes mellitus with diabetic polyneuropathy: Secondary | ICD-10-CM | POA: Diagnosis not present

## 2015-06-01 MED ORDER — WARFARIN SODIUM 5 MG PO TABS: ORAL_TABLET | ORAL | Status: DC

## 2015-06-02 ENCOUNTER — Ambulatory Visit (INDEPENDENT_AMBULATORY_CARE_PROVIDER_SITE_OTHER): Payer: Medicare Other | Admitting: *Deleted

## 2015-06-02 ENCOUNTER — Telehealth: Payer: Self-pay | Admitting: *Deleted

## 2015-06-02 DIAGNOSIS — Z5181 Encounter for therapeutic drug level monitoring: Secondary | ICD-10-CM | POA: Diagnosis not present

## 2015-06-02 DIAGNOSIS — Z7901 Long term (current) use of anticoagulants: Secondary | ICD-10-CM | POA: Diagnosis not present

## 2015-06-02 DIAGNOSIS — Z954 Presence of other heart-valve replacement: Secondary | ICD-10-CM

## 2015-06-02 DIAGNOSIS — Z952 Presence of prosthetic heart valve: Secondary | ICD-10-CM

## 2015-06-02 LAB — POCT INR: INR: 2.4

## 2015-06-02 MED ORDER — WARFARIN SODIUM 5 MG PO TABS: ORAL_TABLET | ORAL | Status: DC

## 2015-06-02 NOTE — Telephone Encounter (Signed)
Signed order for Lasix 40mg  8/31 and if no decrease in weight 05/27/15 40mg  bid on 9/1.  Instructed to call and schedule appt if no improvement in condition faxed to [place].

## 2015-06-03 ENCOUNTER — Telehealth: Payer: Self-pay | Admitting: Family Medicine

## 2015-06-03 NOTE — Telephone Encounter (Signed)
Ms. Agosta is calling stating that she needs a new lab order she has lost the previous on before she sees Dr. [name] on 06/21/15, please advise?

## 2015-06-04 ENCOUNTER — Ambulatory Visit (INDEPENDENT_AMBULATORY_CARE_PROVIDER_SITE_OTHER): Payer: Medicare Other | Admitting: Internal Medicine

## 2015-06-04 ENCOUNTER — Encounter: Payer: Self-pay | Admitting: Internal Medicine

## 2015-06-04 VITALS — BP 116/62 | HR 86 | Ht 67.0 in | Wt 222.0 lb

## 2015-06-04 DIAGNOSIS — I429 Cardiomyopathy, unspecified: Secondary | ICD-10-CM | POA: Diagnosis not present

## 2015-06-04 DIAGNOSIS — I428 Other cardiomyopathies: Secondary | ICD-10-CM

## 2015-06-04 DIAGNOSIS — I442 Atrioventricular block, complete: Secondary | ICD-10-CM | POA: Diagnosis not present

## 2015-06-04 DIAGNOSIS — Z95810 Presence of automatic (implantable) cardiac defibrillator: Secondary | ICD-10-CM | POA: Diagnosis not present

## 2015-06-04 DIAGNOSIS — I10 Essential (primary) hypertension: Secondary | ICD-10-CM

## 2015-06-04 DIAGNOSIS — I5022 Chronic systolic (congestive) heart failure: Secondary | ICD-10-CM | POA: Diagnosis not present

## 2015-06-04 LAB — CUP PACEART INCLINIC DEVICE CHECK
Battery Remaining Longevity: 66 mo
Battery Voltage: 2.92 V
Brady Statistic AP VP Percent: 82.57 %
Brady Statistic AP VS Percent: 0.06 %
Brady Statistic AS VP Percent: 17.33 %
Brady Statistic AS VS Percent: 0.04 %
Brady Statistic RA Percent Paced: 82.63 %
Brady Statistic RV Percent Paced: 99.46 %
Date Time Interrogation Session: 20160909120928
HighPow Impedance: 171 Ohm
HighPow Impedance: 36 Ohm
HighPow Impedance: 42 Ohm
Lead Channel Impedance Value: 342 Ohm
Lead Channel Impedance Value: 361 Ohm
Lead Channel Pacing Threshold Amplitude: 0.5 V
Lead Channel Pacing Threshold Amplitude: 1 V
Lead Channel Pacing Threshold Amplitude: 1.25 V
Lead Channel Pacing Threshold Pulse Width: 0.4 ms
Lead Channel Pacing Threshold Pulse Width: 0.4 ms
Lead Channel Pacing Threshold Pulse Width: 1.5 ms
Lead Channel Sensing Intrinsic Amplitude: 1.5 mV
Lead Channel Sensing Intrinsic Amplitude: 2.25 mV
Lead Channel Setting Pacing Amplitude: 2 V
Lead Channel Setting Pacing Amplitude: 2 V
Lead Channel Setting Pacing Amplitude: 2.5 V
Lead Channel Setting Pacing Pulse Width: 0.6 ms
Lead Channel Setting Pacing Pulse Width: 1.5 ms
Lead Channel Setting Sensing Sensitivity: 0.3 mV
Zone Setting Detection Interval: 320 ms
Zone Setting Detection Interval: 350 ms
Zone Setting Detection Interval: 360 ms
Zone Setting Detection Interval: 410 ms

## 2015-06-04 NOTE — Telephone Encounter (Signed)
Called and left message for patient notifying at a lab order will be faxed to [facility] and that she should be fasting.

## 2015-06-04 NOTE — Assessment & Plan Note (Signed)
Her blood pressure is well controlled. She is encouraged to lose weight, and reduce her sodium intake.

## 2015-06-04 NOTE — Assessment & Plan Note (Signed)
Her medtronic BiV ICD is working satisfactorily. We reprogrammed her pacing vectors today to maximize battery longevity.

## 2015-06-04 NOTE — Patient Instructions (Signed)
Your physician wants you to follow-up in: 9 months Dr Knox Saliva will receive a reminder letter in the mail two months in advance. If you don't receive a letter, please call our office to schedule the follow-up appointment.        Remote monitoring is used to monitor your Pacemaker of ICD from home. This monitoring reduces the number of office visits required to check your device to one time per year. It allows [place] to keep an eye on the functioning of your device to ensure it is working properly. You are scheduled for a device check from home on  09/06/15. You may send your transmission at any time that day. If you have a wireless device, the transmission will be sent automatically. After your physician reviews your transmission, you will receive a postcard with your next transmission date.    Thank you for choosing [facility] !

## 2015-06-04 NOTE — Progress Notes (Signed)
HPI [name] [name] returns today for followup. 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 Her old ICD lead had broken. In the interim, She has been stable. She still has class 2B CHF symptoms. She has been sedentary. She wants to go to the [facility] and start exercising. Allergies  Allergen Reactions  . Aspirin Other (See Comments)    On coumadin  . Fentanyl Dermatitis    Rash with patch   . Niacin Itching  . Penicillins Other (See Comments)    Bruises      Current Outpatient Prescriptions  Medication Sig Dispense Refill  . acetaminophen (TYLENOL) 500 MG tablet Take 500 mg by mouth every 6 (six) hours as needed (pain).    . ADVAIR DISKUS 100-50 MCG/DOSE AEPB inhale 1 dose by mouth twice a day 60 each 4  . albuterol (PROVENTIL HFA;VENTOLIN HFA) 108 (90 BASE) MCG/ACT inhaler Inhale 2 puffs into the lungs every 6 (six) hours as needed for wheezing or shortness of breath. 1 Inhaler 3  . albuterol (PROVENTIL) (2.5 MG/3ML) 0.083% nebulizer solution INHALE 1 VIAL VIA NEBULIZER THREE TIMES DAILY. (Patient taking differently: INHALE 1 VIAL VIA NEBULIZER TWO TIMES DAILY.) 300 mL 3  . allopurinol (ZYLOPRIM) 100 MG tablet take 1 tablet by mouth once daily 90 tablet 1  . cholecalciferol (VITAMIN D) 1000 UNITS tablet Take 1,000 Units by mouth daily.    Marland Kitchen docusate sodium (COLACE) 100 MG capsule Take 300 mg by mouth at bedtime.    Marland Kitchen epoetin alfa (EPOGEN,PROCRIT) 36644 UNIT/ML injection Inject 40,000 Units into the skin once.    . fluticasone (FLONASE) 50 MCG/ACT nasal spray instill 2 sprays into each nostril once daily (Patient taking differently: instill 2 sprays into each nostril once daily as needed for congestion) 16 g 2  . folic acid (FOLVITE) 1 MG tablet take 1 tablet by mouth  once daily 100 tablet 6  . furosemide (LASIX) 40 MG tablet Take 1 tablet by mouth  daily 90 tablet 2  . gabapentin (NEURONTIN) 300 MG capsule take 1 capsule by mouth at bedtime 90 capsule 1  . hydrALAZINE (APRESOLINE) 25 MG tablet Take 1 tablet (25 mg total) by mouth 2 (two) times daily. 270 tablet 3  . HYDROcodone-acetaminophen (NORCO) 7.5-325 MG per tablet Take 0.5-1 tablets by mouth 2 (two) times daily as needed (pain).  30 tablet 0  . isosorbide dinitrate (ISOCHRON) 40 MG CR tablet Take 1/2 tablet (20mg  total) twice a day. 30 tablet 6  . LANOXIN 62.5 MCG TABS Take 1 tablet by mouth  every day 90 tablet 0  . losartan (COZAAR) 50 MG tablet Take 1 tablet by mouth  daily 10 tablet 0  . meclizine (ANTIVERT) 12.5 MG tablet Take 1 tablet (12.5 mg total) by mouth 2 (two) times daily as needed for dizziness. 30 tablet 2  . metolazone (ZAROXOLYN) 2.5 MG tablet Take 1 tablet by mouth once a week 9 tablet 1  . montelukast (SINGULAIR) 10 MG tablet   0  . Multiple Vitamin (MULTIVITAMIN WITH MINERALS) TABS tablet Take 1 tablet by mouth daily. Centrum Silver    . ondansetron (ZOFRAN) 4 MG tablet take 1 tablet by mouth once daily 90 tablet 0  . Polyethyl [name] (SYSTANE OP) Place 1 drop  into both eyes daily as needed (dry eyes).    . polyethylene glycol powder (MIRALAX) powder Take 17 g by mouth daily as needed for mild constipation.     . potassium chloride SA (KLOR-CON M20) 20 MEQ tablet Take 1 tablet by mouth  daily 90 tablet 1  . promethazine-dextromethorphan (PROMETHAZINE-DM) 6.25-15 MG/5ML syrup Take 5 mLs by mouth at bedtime as needed for cough. 118 mL 0  . SYNTHROID 75 MCG tablet Take 1 tablet by mouth  daily 30 tablet 0  . temazepam (RESTORIL) 30 MG capsule take 1 capsule at bedtime if needed 30 capsule 2  . TOPROL XL 100 MG 24 hr tablet Take 1 tablet (100 mg total) by mouth daily. Take with or immediately following a meal. 90 tablet 0  . Vitamin Mixture (ESTER-C PO) Take 1 tablet by  mouth daily as needed (for immune system boost).    . VYTORIN 10-20 MG per tablet Take 1 tablet by mouth 3  times weekly 39 tablet 2  . warfarin (COUMADIN) 5 MG tablet Take 2 tablets daily or as directed by coumadin clinic 10 tablet 1   No current facility-administered medications for this visit.   Facility-Administered Medications Ordered in Other Visits  Medication Dose Route Frequency Provider Last Rate Last Dose  . epoetin alfa (EPOGEN,PROCRIT) injection 40,000 Units  40,000 Units Subcutaneous Once Farrel Gobble, MD         Past Medical History  Diagnosis Date  . DJD (degenerative joint disease) of lumbar spine   . Esophageal reflux   . Other and unspecified hyperlipidemia   . Non-ischemic cardiomyopathy     a. s/p MDT CRTD  . Obesity, unspecified   . 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 IDA (iron deficiency anemia)     Parenteral iron/[name]  . Adenomatous polyp of colon 12/16/2002    Dr. [name]/[facility]  . Hyperlipemia   . COPD (chronic obstructive pulmonary disease)        . CHF (congestive heart failure)        . Constipation        . Gout        . CHB (complete heart block) 10/07/2014       . Insomnia        . Unspecified hypothyroidism        . Vertigo        . DM (diabetes mellitus) 2006  . Chronic kidney disease, stage I 2006  . Thyroid cancer     remote thyroidectomy, no recurrence, pt denies in 2016 thyroid cancer  . Oxygen deficiency 2014    nocturnal;  . Heart murmur   . Allergy     ROS:   All systems reviewed and negative except as noted in the HPI.   Past Surgical History  Procedure Laterality Date  . Thyroidectomy    . Pacemaker placed  2004  . Aortic and mitral valve replacement  2001  . Right breast cyst removed      benign   . Right cataract removed      2005  . Defibrillator implanted 2006    . Cardiac valve replacement    . Cardiac catheterization    .  Insert / replace / remove pacemaker    . Esophagogastroduodenoscopy  06/12/2007    Rourk- normal esophagus, small hiatal hernia, otherwise normal stomach, D1, D2  . Colonoscopy  06/12/2007    Rourk- Normal rectum/Normal colon  .  Colonoscopy  12/16/02    small hemorrhoids  . Colonoscopy  06/20/2012    Procedure: COLONOSCOPY;  Surgeon: Daneil Dolin, MD;  Location: [facility];  Service: Endoscopy;  Laterality: N/A;  10:45  . Doppler echocardiography  2012  . Doppler echocardiography  05,06,07,08,09,2011  . Bi-ventricular implantable cardioverter defibrillator upgrade N/A 01/18/2015    Procedure: BI-VENTRICULAR IMPLANTABLE CARDIOVERTER DEFIBRILLATOR UPGRADE;  Surgeon: Evans Lance, MD;  Location: [facility] CATH LAB;  Service: Cardiovascular;  Laterality: N/A;  . Icd lead removal Left 02/08/2015    Procedure: ICD LEAD REMOVAL;  Surgeon: Evans Lance, MD;  Location: [facility] OR;  Service: Cardiovascular;  Laterality: Left;  "Will plan extraction and insertion of a BiV PM"  **Dr. Roxan Hockey backing up case**  . Implantable cardioverter defibrillator (icd) generator change Left 02/08/2015    Procedure: ICD GENERATOR CHANGE;  Surgeon: Evans Lance, MD;  Location: [facility];  Service: Cardiovascular;  Laterality: Left;  Marland Kitchen Eye surgery Right 2005  . Eye surgery Left 2006  . Pacemaker insertion  May 2016     Family History  Problem Relation Age of Onset  . Cancer Mother     behind pancres  . Heart disease Father   . Heart disease Sister     Heart attack  . Heart disease Brother   . Diabetes Brother   . Heart disease Brother   . Diabetes Brother   . Hypertension Brother      Social History   Social History  . Marital Status: Widowed    Spouse Name: N/A  . Number of Children: 0  . Years of Education: N/A   Occupational History  . retired    Social History Main Topics  . Smoking status: Former Smoker -- 0.75 packs/day for 40 years    Types: Cigarettes    Quit date: 03/08/1987  .  Smokeless tobacco: Never Used  . Alcohol Use: No  . Drug Use: No  . Sexual Activity: Not on file   Other Topics Concern  . Not on file   Social History Narrative   From [place] (2004)     BP 116/62 mmHg  Pulse 86  Ht 5\' 7"  (1.702 m)  Wt 222 lb (100.699 kg)  BMI 34.76 kg/m2  SpO2 95%  Physical Exam:  stable appearing 81 yo man, NAD HEENT: Unremarkable Neck:  7 cm JVD, no thyromegally Back:  No CVA tenderness Lungs:  Clear with no wheezes HEART:  Regular rate rhythm, no murmurs, no rubs, no clicks Abd:  soft, positive bowel sounds, no organomegally, no rebound, no guarding Ext:  2 plus pulses, no edema, no cyanosis, no clubbing Skin:  No rashes no nodules Neuro:  CN II through XII intact, motor grossly intact  EKG - nsr with biv pacing  DEVICE  Normal device function.  See PaceArt for details.   Assess/Plan:

## 2015-06-04 NOTE — Assessment & Plan Note (Signed)
Her symptoms are class 2B. I have encouraged her to increase her physical activity. No change in her meds today.

## 2015-06-07 DIAGNOSIS — N181 Chronic kidney disease, stage 1: Secondary | ICD-10-CM | POA: Diagnosis not present

## 2015-06-07 DIAGNOSIS — I429 Cardiomyopathy, unspecified: Secondary | ICD-10-CM | POA: Diagnosis not present

## 2015-06-07 DIAGNOSIS — J449 Chronic obstructive pulmonary disease, unspecified: Secondary | ICD-10-CM | POA: Diagnosis not present

## 2015-06-07 DIAGNOSIS — I129 Hypertensive chronic kidney disease with stage 1 through stage 4 chronic kidney disease, or unspecified chronic kidney disease: Secondary | ICD-10-CM | POA: Diagnosis not present

## 2015-06-07 DIAGNOSIS — I5020 Unspecified systolic (congestive) heart failure: Secondary | ICD-10-CM | POA: Diagnosis not present

## 2015-06-07 DIAGNOSIS — E119 Type 2 diabetes mellitus without complications: Secondary | ICD-10-CM | POA: Diagnosis not present

## 2015-06-08 DIAGNOSIS — D509 Iron deficiency anemia, unspecified: Secondary | ICD-10-CM | POA: Diagnosis not present

## 2015-06-08 DIAGNOSIS — Z79899 Other long term (current) drug therapy: Secondary | ICD-10-CM | POA: Diagnosis not present

## 2015-06-08 DIAGNOSIS — R809 Proteinuria, unspecified: Secondary | ICD-10-CM | POA: Diagnosis not present

## 2015-06-08 DIAGNOSIS — E1165 Type 2 diabetes mellitus with hyperglycemia: Secondary | ICD-10-CM | POA: Diagnosis not present

## 2015-06-08 DIAGNOSIS — E559 Vitamin D deficiency, unspecified: Secondary | ICD-10-CM | POA: Diagnosis not present

## 2015-06-08 DIAGNOSIS — E079 Disorder of thyroid, unspecified: Secondary | ICD-10-CM | POA: Diagnosis not present

## 2015-06-08 DIAGNOSIS — E780 Pure hypercholesterolemia: Secondary | ICD-10-CM | POA: Diagnosis not present

## 2015-06-08 DIAGNOSIS — N183 Chronic kidney disease, stage 3 (moderate): Secondary | ICD-10-CM | POA: Diagnosis not present

## 2015-06-08 DIAGNOSIS — I10 Essential (primary) hypertension: Secondary | ICD-10-CM | POA: Diagnosis not present

## 2015-06-08 DIAGNOSIS — D649 Anemia, unspecified: Secondary | ICD-10-CM | POA: Diagnosis not present

## 2015-06-09 DIAGNOSIS — N183 Chronic kidney disease, stage 3 (moderate): Secondary | ICD-10-CM | POA: Diagnosis not present

## 2015-06-09 DIAGNOSIS — R809 Proteinuria, unspecified: Secondary | ICD-10-CM | POA: Diagnosis not present

## 2015-06-09 DIAGNOSIS — E559 Vitamin D deficiency, unspecified: Secondary | ICD-10-CM | POA: Diagnosis not present

## 2015-06-09 DIAGNOSIS — D649 Anemia, unspecified: Secondary | ICD-10-CM | POA: Diagnosis not present

## 2015-06-10 ENCOUNTER — Encounter (HOSPITAL_COMMUNITY): Payer: Medicare Other | Attending: Hematology & Oncology

## 2015-06-10 ENCOUNTER — Telehealth: Payer: Self-pay | Admitting: *Deleted

## 2015-06-10 ENCOUNTER — Other Ambulatory Visit: Payer: Self-pay

## 2015-06-10 ENCOUNTER — Encounter (HOSPITAL_COMMUNITY): Payer: Medicare Other | Attending: Internal Medicine

## 2015-06-10 ENCOUNTER — Encounter (HOSPITAL_COMMUNITY): Payer: Self-pay | Admitting: Hematology & Oncology

## 2015-06-10 ENCOUNTER — Encounter (HOSPITAL_BASED_OUTPATIENT_CLINIC_OR_DEPARTMENT_OTHER): Payer: Medicare Other | Admitting: Hematology & Oncology

## 2015-06-10 VITALS — BP 137/62 | HR 78 | Resp 22 | Wt 224.0 lb

## 2015-06-10 DIAGNOSIS — D649 Anemia, unspecified: Secondary | ICD-10-CM

## 2015-06-10 DIAGNOSIS — N189 Chronic kidney disease, unspecified: Secondary | ICD-10-CM

## 2015-06-10 DIAGNOSIS — D638 Anemia in other chronic diseases classified elsewhere: Secondary | ICD-10-CM | POA: Insufficient documentation

## 2015-06-10 DIAGNOSIS — N184 Chronic kidney disease, stage 4 (severe): Secondary | ICD-10-CM

## 2015-06-10 DIAGNOSIS — E611 Iron deficiency: Secondary | ICD-10-CM | POA: Insufficient documentation

## 2015-06-10 MED ORDER — EPOETIN ALFA 40000 UNIT/ML IJ SOLN
40000.0000 [IU] | Freq: Once | INTRAMUSCULAR | Status: AC
  Administered 2015-06-10: 40000 [IU] via SUBCUTANEOUS
  Filled 2015-06-10: qty 1

## 2015-06-10 MED ORDER — LOSARTAN POTASSIUM 50 MG PO TABS: ORAL_TABLET | ORAL | Status: DC

## 2015-06-10 NOTE — Progress Notes (Signed)
[name], MD [address], [address] [place] [address]    DIAGNOSIS:  Stage III CKD Anemia Microangiopathic hemolytic anemia Iron deficiency Mechanical mitral and aortic valves 06/10/2012 colonoscopy with a long torturous colon and poor prep  CURRENT THERAPY: ESA's, IV iron replacement   INTERVAL HISTORY: [name] [name] 81 y.o. female returns for follow-up of anemia. She has a mechanical mitral and aortic valves and is felt to have a low grade hemolysis that contributes. She has no major complaints today and she states she feels well. Her appetite is excellent. She had her valves replaced in 2001 and 2004.   Her major complaint today revolves around a bad right knee and she needs to use a cane.   The patient is on Coumadin.  Her blood counts from 3 weeks ago were excellent.  She takes a daily D vitamin.  She does not take iron pills.  She has no concerns at this time.     MEDICAL HISTORY: Past Medical History  Diagnosis Date   DJD (degenerative joint disease) of lumbar spine    Esophageal reflux    Other and unspecified hyperlipidemia    Non-ischemic cardiomyopathy     a. s/p MDT CRTD   Obesity, unspecified    Varicose veins of lower extremities with other complications    IDA (iron deficiency anemia)     Parenteral iron/[name]   Adenomatous polyp of colon 12/16/2002    Dr. [name]/[facility]   Hyperlipemia    COPD (chronic obstructive pulmonary disease)         CHF (congestive heart failure)         Constipation         Gout         CHB (complete heart block) 10/07/2014        Insomnia         Unspecified hypothyroidism         Vertigo         DM (diabetes mellitus) 2006   Chronic kidney disease, stage I 2006   Thyroid cancer     remote thyroidectomy, no recurrence, pt denies in 2016 thyroid cancer   Oxygen deficiency 2014    nocturnal;   Heart murmur    Allergy     has  Hypothyroidism, postsurgical; Type 2 diabetes mellitus; Hyperlipidemia LDL goal <70; OBESITY; Essential hypertension; Non-ischemic cardiomyopathy with ICD; VARICOSE VEINS LOWER EXTREMITIES W/OTH COMPS; GERD; CHOLELITHIASIS; HIP PAIN, RIGHT; DEGENERATIVE JOINT DISEASE, SPINE; Nausea without vomiting; Allergic rhinitis; Iron deficiency; Papilloma of breast; Warfarin-induced coagulopathy; Cardiorenal syndrome with renal failure; S/P AVR (aortic valve replacement); S/P MVR (mitral valve replacement); AICD (automatic cardioverter/defibrillator) present; CKD (chronic kidney disease), stage IV; Chronic anticoagulation, on coumadin for Mechanical AVR and MVR; COPD (chronic obstructive pulmonary disease); Chronic systolic heart failure; H/O adenomatous polyp of colon; High risk medication use; Hemolysis; Spinal stenosis of lumbar region; OA (osteoarthritis) of knee; Rotator cuff syndrome of left shoulder; Fatigue due to treatment; Encounter for therapeutic drug monitoring; Osteopenia; Anemia; Fatigue; CHB (complete heart block); Back pain without radiation; S/P ICD (internal cardiac defibrillator) procedure; Seasonal allergies; Medicare annual wellness visit, subsequent; and Need for Tdap vaccination on her problem list.     is allergic to aspirin; fentanyl; niacin; and penicillins.  We administered epoetin alfa.  SURGICAL HISTORY: Past Surgical History  Procedure Laterality Date   Thyroidectomy     Pacemaker placed  2004   Aortic and mitral valve replacement  2001   Right breast cyst removed      benign    Right cataract removed      2005   Defibrillator implanted 2006     Cardiac valve replacement     Cardiac catheterization     Insert / replace / remove pacemaker     Esophagogastroduodenoscopy  06/12/2007    Rourk- normal esophagus, small hiatal hernia, otherwise normal stomach, D1, D2   Colonoscopy  06/12/2007    Rourk- Normal rectum/Normal colon   Colonoscopy  12/16/02    small  hemorrhoids   Colonoscopy  06/20/2012    Procedure: COLONOSCOPY;  Surgeon: Daneil Dolin, MD;  Location: [facility];  Service: Endoscopy;  Laterality: N/A;  10:45   Doppler echocardiography  2012   Doppler echocardiography  05,06,07,08,09,2011   Bi-ventricular implantable cardioverter defibrillator upgrade N/A 01/18/2015    Procedure: BI-VENTRICULAR IMPLANTABLE CARDIOVERTER DEFIBRILLATOR UPGRADE;  Surgeon: Evans Lance, MD;  Location: [facility] CATH LAB;  Service: Cardiovascular;  Laterality: N/A;   Icd lead removal Left 02/08/2015    Procedure: ICD LEAD REMOVAL;  Surgeon: Evans Lance, MD;  Location: [place];  Service: Cardiovascular;  Laterality: Left;  "Will plan extraction and insertion of a BiV PM"  **Dr. Roxan Hockey backing up case**   Implantable cardioverter defibrillator (icd) generator change Left 02/08/2015    Procedure: ICD GENERATOR CHANGE;  Surgeon: Evans Lance, MD;  Location: [place];  Service: Cardiovascular;  Laterality: Left;   Eye surgery Right 2005   Eye surgery Left 2006   Pacemaker insertion  May 2016    SOCIAL HISTORY: Social History   Social History   Marital Status: Widowed    Spouse Name: N/A   Number of Children: 0   Years of Education: N/A   Occupational History   retired    Social History Main Topics   Smoking status: Former Smoker -- 0.75 packs/day for 40 years    Types: Cigarettes    Quit date: 03/08/1987   Smokeless tobacco: Never Used   Alcohol Use: No   Drug Use: No   Sexual Activity: Not on file   Other Topics Concern   Not on file   Social History Narrative   From [place] (2004)    FAMILY HISTORY: Family History  Problem Relation Age of Onset   Cancer Mother     behind pancres   Heart disease Father    Heart disease Sister     Heart attack   Heart disease Brother    Diabetes Brother    Heart disease Brother    Diabetes Brother    Hypertension Brother     Review of Systems  Constitutional:  Negative for fever, chills, weight loss and malaise/fatigue.  HENT: Negative for congestion, hearing loss, nosebleeds, sore throat and tinnitus.   Eyes: Negative for blurred vision, double vision, pain and discharge.  Respiratory: Negative for cough, hemoptysis, sputum production, shortness of breath and wheezing.   Cardiovascular: Negative for chest pain, palpitations, claudication, leg swelling and PND.  Gastrointestinal: Negative for heartburn, nausea, vomiting, abdominal pain, diarrhea, constipation, blood in stool and melena.  Genitourinary: Negative for dysuria, urgency, frequency and hematuria.  Musculoskeletal: Negative for myalgias, joint pain and falls.  Skin: Negative for itching and rash.  Neurological: Negative for dizziness, tingling, tremors, sensory change, speech change, focal weakness, seizures, loss of consciousness, weakness and headaches.  Endo/Heme/Allergies: Does not bruise/bleed easily.  Psychiatric/Behavioral: Negative for depression, suicidal ideas, memory loss and substance abuse. The patient  is not nervous/anxious and does not have insomnia.   14 point review of systems was performed and is negative except as detailed under history of present illness and above   PHYSICAL EXAMINATION  ECOG PERFORMANCE STATUS: 0 - Asymptomatic  Filed Vitals:   06/10/15 1154  BP: 137/62  Pulse: 78  Resp: 22    Physical Exam  Constitutional: She is oriented to person, place, and time and well-developed, well-nourished, and in no distress.  HENT:  Head: Normocephalic and atraumatic.  Nose: Nose normal.  Mouth/Throat: Oropharynx is clear and moist. No oropharyngeal exudate.  Eyes: Conjunctivae and EOM are normal. Pupils are equal, round, and reactive to light. Right eye exhibits no discharge. Left eye exhibits no discharge. No scleral icterus.  Neck: Normal range of motion. Neck supple. No tracheal deviation present. No thyromegaly present.  Cardiovascular: Normal rate and  regular rhythm.  Exam reveals no gallop and no friction rub.   No murmur heard. mechanical heart valves noted  Pulmonary/Chest: Effort normal and breath sounds normal. She has no wheezes. She has no rales.  Abdominal: Soft. Bowel sounds are normal. She exhibits no distension and no mass. There is no tenderness. There is no rebound and no guarding.  Musculoskeletal: Normal range of motion. She exhibits no edema.  Lymphadenopathy:    She has no cervical adenopathy.  Neurological: She is alert and oriented to person, place, and time. She has normal reflexes. No cranial nerve deficit. Gait normal. Coordination normal.  Skin: Skin is warm and dry. No rash noted.  Psychiatric: Mood, memory, affect and judgment normal.  Nursing note and vitals reviewed.   LABORATORY DATA:  CBC    Component Value Date/Time   WBC 7.2 05/19/2015 1113   RBC 3.70* 05/19/2015 1113   RBC 3.37* 01/30/2014 1255   HGB 11.8* 05/19/2015 1113   HCT 35.4* 05/19/2015 1113   PLT 209 05/19/2015 1113   MCV 95.7 05/19/2015 1113   MCH 31.9 05/19/2015 1113   MCHC 33.3 05/19/2015 1113   RDW 14.4 05/19/2015 1113   LYMPHSABS 1.3 01/15/2015 1131   MONOABS 0.4 01/15/2015 1131   EOSABS 0.0 01/15/2015 1131   BASOSABS 0.0 01/15/2015 1131   CMP     Component Value Date/Time   NA 142 02/15/2015 1253   K 4.7 02/15/2015 1253   CL 103 02/15/2015 1253   CO2 26 02/15/2015 1253   GLUCOSE 92 02/15/2015 1253   BUN 54* 02/15/2015 1253   CREATININE 2.34* 02/15/2015 1253   CREATININE 2.27* 02/11/2015 1015   CALCIUM 9.3 02/15/2015 1253   CALCIUM 9.5 06/12/2010 0459   PROT 7.1 02/15/2015 1253   ALBUMIN 3.6 02/15/2015 1253   AST 26 02/15/2015 1253   ALT 12 02/15/2015 1253   ALKPHOS 36* 02/15/2015 1253   BILITOT 0.7 02/15/2015 1253   GFRNONAA 19* 02/15/2015 1253   GFRNONAA 19* 02/11/2015 1015   GFRAA 22* 02/15/2015 1253   GFRAA 22* 02/11/2015 1015     ASSESSMENT and THERAPY PLAN:   Iron deficiency anemia  44 -year-old  female with anemia felt to be secondary to ongoing mild hemolysis from 2 mechanical heart valves. Looking back over her ferritin levels she certainly does not need any additional iron. Whether or not to continue her on Procrit, I think should be reconsidered and we can address this at follow-up. She seems to be doing well. We will schedule a return appointment in 6 months. Sooner if needed.   Chronic kidney disease  This certainly contributes to her  underlying anemia, this is an indication to continue her growth factor support. She does follow with nephrology.  All questions were answered. The patient knows to call the clinic with any problems, questions or concerns. We can certainly see the patient much sooner if necessary.   This document serves as a record of services personally performed by Ancil Linsey, MD. It was created on her behalf by Janace Hoard, a trained medical scribe. The creation of this record is based on the scribe's personal observations and the provider's statements to them. This document has been checked and approved by the attending provider.  I have reviewed the above documentation for accuracy and completeness, and I agree with the above.  This note was electronically signed.  Kelby Fam. Whitney Muse, MD

## 2015-06-10 NOTE — Progress Notes (Signed)
[name] [name] presents today for injection per MD orders. Procrit 40,000 units administered SQ in left Abdomen. Administration without incident. Patient tolerated well.  

## 2015-06-10 NOTE — Telephone Encounter (Signed)
Patient called to discuss medications, patient states one of her medications is 118$. Please advise

## 2015-06-10 NOTE — Progress Notes (Signed)
Please see doctors encounter for more information 

## 2015-06-10 NOTE — Patient Instructions (Signed)
[place] at [facility] Discharge Instructions  RECOMMENDATIONS MADE BY THE CONSULTANT AND ANY TEST RESULTS WILL BE SENT TO YOUR REFERRING PHYSICIAN.  Exam done and seen today by Dr.Penland Labs with Ferritin in 32months Follow up in 6 months  Thank you for choosing [place] at [facility] to provide your oncology and hematology care.  To afford each patient quality time with our provider, please arrive at least 15 minutes before your scheduled appointment time.    You need to re-schedule your appointment should you arrive 10 or more minutes late.  We strive to give you quality time with our providers, and arriving late affects you and other patients whose appointments are after yours.  Also, if you no show three or more times for appointments you may be dismissed from the clinic at the providers discretion.     Again, thank you for choosing [facility].  Our hope is that these requests will decrease the amount of time that you wait before being seen by our physicians.       _____________________________________________________________  Should you have questions after your visit to [facility], please contact our office at [phone] between the hours of 8:30 a.m. and 4:30 p.m.  Voicemails left after 4:30 p.m. will not be returned until the following business day.  For prescription refill requests, have your pharmacy contact our office.

## 2015-06-10 NOTE — Telephone Encounter (Signed)
Patient is asking for a 90 day supply of Losartan be sent to [facility]. Med sent

## 2015-06-21 ENCOUNTER — Other Ambulatory Visit: Payer: Self-pay | Admitting: Family Medicine

## 2015-06-21 ENCOUNTER — Ambulatory Visit (INDEPENDENT_AMBULATORY_CARE_PROVIDER_SITE_OTHER): Payer: Medicare Other | Admitting: Family Medicine

## 2015-06-21 ENCOUNTER — Encounter: Payer: Self-pay | Admitting: Family Medicine

## 2015-06-21 VITALS — BP 128/60 | HR 76 | Resp 18 | Ht 67.0 in | Wt 232.0 lb

## 2015-06-21 DIAGNOSIS — E785 Hyperlipidemia, unspecified: Secondary | ICD-10-CM | POA: Diagnosis not present

## 2015-06-21 DIAGNOSIS — R51 Headache: Secondary | ICD-10-CM | POA: Diagnosis not present

## 2015-06-21 DIAGNOSIS — Z23 Encounter for immunization: Secondary | ICD-10-CM

## 2015-06-21 DIAGNOSIS — I428 Other cardiomyopathies: Secondary | ICD-10-CM

## 2015-06-21 DIAGNOSIS — I429 Cardiomyopathy, unspecified: Secondary | ICD-10-CM

## 2015-06-21 DIAGNOSIS — J449 Chronic obstructive pulmonary disease, unspecified: Secondary | ICD-10-CM

## 2015-06-21 DIAGNOSIS — I10 Essential (primary) hypertension: Secondary | ICD-10-CM | POA: Diagnosis not present

## 2015-06-21 DIAGNOSIS — E1122 Type 2 diabetes mellitus with diabetic chronic kidney disease: Secondary | ICD-10-CM

## 2015-06-21 DIAGNOSIS — I255 Ischemic cardiomyopathy: Secondary | ICD-10-CM

## 2015-06-21 DIAGNOSIS — E890 Postprocedural hypothyroidism: Secondary | ICD-10-CM

## 2015-06-21 DIAGNOSIS — Z01419 Encounter for gynecological examination (general) (routine) without abnormal findings: Secondary | ICD-10-CM

## 2015-06-21 DIAGNOSIS — N189 Chronic kidney disease, unspecified: Secondary | ICD-10-CM

## 2015-06-21 DIAGNOSIS — R519 Headache, unspecified: Secondary | ICD-10-CM | POA: Insufficient documentation

## 2015-06-21 MED ORDER — ISOSORBIDE DINITRATE ER 40 MG PO TBCR: EXTENDED_RELEASE_TABLET | ORAL | Status: DC

## 2015-06-21 MED ORDER — CLINDAMYCIN HCL 300 MG PO CAPS: 600.0000 mg | ORAL_CAPSULE | ORAL | Status: DC

## 2015-06-21 NOTE — Progress Notes (Signed)
Subjective:    Patient ID: [name] [name], female    DOB: 1931/12/03, 81 y.o.   MRN: [id number]  HPI   [name] [name]     MRN: [id number]      DOB: March 23, 1932   HPI [name] is here for follow up and re-evaluation of chronic medical conditions, medication management and review of any available recent lab and radiology data.  Preventive health is updated, specifically  Cancer screening and Immunization.  Needs flu vaccine Had colonoscopy since last visit  The PT denies any adverse reactions to current medications since the last visit.  C/o 1 month h/o left sided headaches, reports 3 different occasions involving unintentional  head trauma in the past month, is concerned , especially as she remains chronically anti coagulated C/o becoming increasingly agitated with the humming noises that her brother who she lives with makes, loves him dearly , but is concerned that unless he can get help for this , she may need to move out as it rattles her nerves. She agrees with directly asking him to get help from [psych, and when he was asked about this , he agreed to go for help Denies polyuria, polydipsia, blurred vision , or hypoglycemic episodes.   ROS Denies recent fever or chills. Denies sinus pressure, nasal congestion, ear pain or sore throat. Denies chest congestion, productive cough or wheezing. Denies chest pains, palpitations and leg swelling Denies abdominal pain, nausea, vomiting,diarrhea or constipation.   Denies dysuria, frequency, hesitancy or incontinence. Denies joint pain, swelling and limitation in mobility. .  Denies skin break down or rash.   PE  BP 128/60 mmHg  Pulse 76  Resp 18  Ht 5\' 7"  (1.702 m)  Wt 232 lb (105.235 kg)  BMI 36.33 kg/m2  SpO2 97%  Patient alert and oriented and in no cardiopulmonary distress.  HEENT: No facial asymmetry, EOMI,   oropharynx pink and moist.  Neck supple no JVD, no mass.  Chest: Clear to auscultation bilaterally.  CVS: S1,  S2 click and murmurs, no S3.IrRegular rate.  ABD: Soft non tender.   Ext: No edema  MS: Adequate though reduced  ROM spine, shoulders, hips and knees.  Skin: Intact, no ulcerations or rash noted.  Psych: Good eye contact, normal affect. Memory intact not anxious or depressed appearing.  CNS: CN 2-12 intact, power,  normal throughout.no focal deficits noted.   Assessment & Plan   Essential hypertension Controlled, no change in medication DASH diet and commitment to daily physical activity for a minimum of 30 minutes discussed and encouraged, as a part of hypertension management. The importance of attaining a healthy weight is also discussed.  BP/Weight 06/21/2015 06/10/2015 06/04/2015 04/29/2015 04/08/2015 03/15/2015 [id number]  Systolic BP 0000000 0000000 [id number] [id number]  Diastolic BP 60 62 62 55 56 57 62  Wt. (Lbs) 232 224 222 - - - 225.1  BMI 36.33 35.08 34.76 - - - 35.25        Non-ischemic cardiomyopathy with ICD Clinically stable, no symptoms/ signs of cardiovascular decompensation at visit Followed closely by cardiology  Hypothyroidism, postsurgical Controlled, no change in medication   Type 2 diabetes mellitus Controlled, no change in medication [name] is reminded of the importance of commitment to daily physical activity for 30 minutes or more, as able and the need to limit carbohydrate intake to 30 to 60 grams per meal to help with blood sugar control.   The need to take medication as prescribed, test blood sugar as directed,  and to call between visits if there is a concern that blood sugar is uncontrolled is also discussed.   [name] is reminded of the importance of daily foot exam, annual eye examination, and good blood sugar, blood pressure and cholesterol control.  Diabetic Labs Latest Ref Rng 02/15/2015 02/11/2015 02/10/2015 02/09/2015 02/08/2015  HbA1c <5.7 % 6.1(H) - - - -  Microalbumin <2.0 mg/dL 2.2(H) - - - -  Micro/Creat Ratio 0.0 - 30.0 mg/g 19.3 - - -  -  Chol 0 - 200 mg/dL - - - - -  HDL >39 mg/dL - - - - -  Calc LDL 0 - 99 mg/dL - - - - -  Triglycerides <150 mg/dL - - - - -  Creatinine 0.50 - 1.10 mg/dL 2.34(H) 2.27(H) 2.91(H) 3.03(H) 2.50(H)   BP/Weight 06/21/2015 06/10/2015 06/04/2015 04/29/2015 04/08/2015 03/15/2015 [id number]  Systolic BP 0000000 0000000 [id number] Q000111Q [id number]  Diastolic BP 60 62 62 55 56 57 62  Wt. (Lbs) 232 224 222 - - - 225.1  BMI 36.33 35.08 34.76 - - - 35.25   Foot/eye exam completion dates 02/17/2015 02/05/2014  Foot Form Completion Done Done         Hyperlipidemia LDL goal <70 Hyperlipidemia:Low fat diet discussed and encouraged.   Lipid Panel  Lab Results  Component Value Date   CHOL 130 09/14/2014   HDL 35* 09/14/2014   LDLCALC 69 09/14/2014   TRIG 132 09/14/2014   CHOLHDL 3.7 09/14/2014   Updated lab entered elsewhere in chart   Controlled, no change in medication   New onset of headaches after age 81 1 month h/o new left sided headaches, with h/o recurrent head trauma, head CT scan to furhter assess and r/o intracranial bleed as pt on chronic anti coagullation. Neuro exam at visit is non focal  COPD (chronic obstructive pulmonary disease) Controlled, no change in medication   Morbid obesity Deteriorated. Patient re-educated about  the importance of commitment to a  minimum of 150 minutes of exercise per week.  The importance of healthy food choices with portion control discussed. Encouraged to start a food diary, count calories and to consider  joining a support group. Sample diet sheets offered. Goals set by the patient for the next several months.   Weight /BMI 06/21/2015 06/10/2015 06/04/2015  WEIGHT 232 lb 224 lb 222 lb  HEIGHT 5\' 7"  - 5\' 7"   BMI 36.33 kg/m2 35.08 kg/m2 34.76 kg/m2    Current exercise per week 60 minutes.       Review of Systems     Objective:   Physical Exam        Assessment & Plan:

## 2015-06-21 NOTE — Patient Instructions (Addendum)
F/u in 4.5 month, call if you need me before   You are referred for annual exam with gyne in January, per your request  You  Are referred for head CT scan, due to headache following repeated head trauma    Labs are very good  Fastiing lipid, cmp and EGFR, HBA1C and tSH  Flu vaccine today

## 2015-06-22 ENCOUNTER — Other Ambulatory Visit: Payer: Self-pay

## 2015-06-22 ENCOUNTER — Other Ambulatory Visit (HOSPITAL_COMMUNITY): Payer: Self-pay

## 2015-06-22 MED ORDER — CLINDAMYCIN HCL 300 MG PO CAPS: 600.0000 mg | ORAL_CAPSULE | ORAL | Status: DC

## 2015-06-23 ENCOUNTER — Telehealth: Payer: Self-pay

## 2015-06-24 NOTE — Telephone Encounter (Signed)
Patient aware.  States that she will call before her next dentist visit which is in March and get the clindamycin refilled.

## 2015-06-24 NOTE — Telephone Encounter (Signed)
She does need preventive antibiotic prior to any  dental work, even with "minimal bleeding" she has prosthetic heart valaves and is on a blood thinner recommendation for her is clindamycin 600 mg total, 300mg  two capsules PRIOR to procedure. This is safe for her to take though she is allergic to penicillin, pls help her to understand so no confusion in the future Script that was sent was correct and had the correct directions , enough medication  Was sent for FIVE individual uses for this purpose, pls explain

## 2015-06-25 ENCOUNTER — Ambulatory Visit (HOSPITAL_COMMUNITY)
Admission: RE | Admit: 2015-06-25 | Discharge: 2015-06-25 | Disposition: A | Discharge status: Home / Self Care | Payer: Medicare Other | Source: Ambulatory Visit | Attending: Family Medicine | Admitting: Family Medicine

## 2015-06-25 DIAGNOSIS — R51 Headache: Secondary | ICD-10-CM | POA: Insufficient documentation

## 2015-06-25 DIAGNOSIS — R519 Headache, unspecified: Secondary | ICD-10-CM

## 2015-06-25 NOTE — Assessment & Plan Note (Signed)
Controlled, no change in medication  

## 2015-06-25 NOTE — Assessment & Plan Note (Signed)
Hyperlipidemia:Low fat diet discussed and encouraged.   Lipid Panel  Lab Results  Component Value Date   CHOL 130 09/14/2014   HDL 35* 09/14/2014   LDLCALC 69 09/14/2014   TRIG 132 09/14/2014   CHOLHDL 3.7 09/14/2014   Updated lab entered elsewhere in chart   Controlled, no change in medication

## 2015-06-25 NOTE — Assessment & Plan Note (Signed)
Controlled, no change in medication [name] [name] is reminded of the importance of commitment to daily physical activity for 30 minutes or more, as able and the need to limit carbohydrate intake to 30 to 60 grams per meal to help with blood sugar control.   The need to take medication as prescribed, test blood sugar as directed, and to call between visits if there is a concern that blood sugar is uncontrolled is also discussed.   [name] [name] is reminded of the importance of daily foot exam, annual eye examination, and good blood sugar, blood pressure and cholesterol control.  Diabetic Labs Latest Ref Rng 02/15/2015 02/11/2015 02/10/2015 02/09/2015 02/08/2015  HbA1c <5.7 % 6.1(H) - - - -  Microalbumin <2.0 mg/dL 2.2(H) - - - -  Micro/Creat Ratio 0.0 - 30.0 mg/g 19.3 - - - -  Chol 0 - 200 mg/dL - - - - -  HDL >39 mg/dL - - - - -  Calc LDL 0 - 99 mg/dL - - - - -  Triglycerides <150 mg/dL - - - - -  Creatinine 0.50 - 1.10 mg/dL 2.34(H) 2.27(H) 2.91(H) 3.03(H) 2.50(H)   BP/Weight 06/21/2015 06/10/2015 06/04/2015 04/29/2015 04/08/2015 03/15/2015 [id number]  Systolic BP 0000000 0000000 [id number] [id number] [id number]  Diastolic BP 60 62 62 55 56 57 62  Wt. (Lbs) 232 224 222 - - - 225.1  BMI 36.33 35.08 34.76 - - - 35.25   Foot/eye exam completion dates 02/17/2015 02/05/2014  Foot Form Completion Done Done

## 2015-06-25 NOTE — Assessment & Plan Note (Signed)
Clinically stable, no symptoms/ signs of cardiovascular decompensation at visit Followed closely by cardiology

## 2015-06-25 NOTE — Assessment & Plan Note (Signed)
Deteriorated. Patient re-educated about  the importance of commitment to a  minimum of 150 minutes of exercise per week.  The importance of healthy food choices with portion control discussed. Encouraged to start a food diary, count calories and to consider  joining a support group. Sample diet sheets offered. Goals set by the patient for the next several months.   Weight /BMI 06/21/2015 06/10/2015 06/04/2015  WEIGHT 232 lb 224 lb 222 lb  HEIGHT 5\' 7"  - 5\' 7"   BMI 36.33 kg/m2 35.08 kg/m2 34.76 kg/m2    Current exercise per week 60 minutes.

## 2015-06-25 NOTE — Assessment & Plan Note (Signed)
Controlled, no change in medication DASH diet and commitment to daily physical activity for a minimum of 30 minutes discussed and encouraged, as a part of hypertension management. The importance of attaining a healthy weight is also discussed.  BP/Weight 06/21/2015 06/10/2015 06/04/2015 04/29/2015 04/08/2015 03/15/2015 [id number]  Systolic BP 0000000 0000000 [id number] Q000111Q [id number]  Diastolic BP 60 62 62 55 56 57 62  Wt. (Lbs) 232 224 222 - - - 225.1  BMI 36.33 35.08 34.76 - - - 35.25

## 2015-06-25 NOTE — Assessment & Plan Note (Signed)
1 month h/o new left sided headaches, with h/o recurrent head trauma, head CT scan to furhter assess and r/o intracranial bleed as pt on chronic anti coagullation. Neuro exam at visit is non focal

## 2015-06-30 ENCOUNTER — Ambulatory Visit (INDEPENDENT_AMBULATORY_CARE_PROVIDER_SITE_OTHER): Payer: Medicare Other | Admitting: *Deleted

## 2015-06-30 DIAGNOSIS — Z7901 Long term (current) use of anticoagulants: Secondary | ICD-10-CM

## 2015-06-30 DIAGNOSIS — Z954 Presence of other heart-valve replacement: Secondary | ICD-10-CM

## 2015-06-30 DIAGNOSIS — Z952 Presence of prosthetic heart valve: Secondary | ICD-10-CM

## 2015-06-30 DIAGNOSIS — Z5181 Encounter for therapeutic drug level monitoring: Secondary | ICD-10-CM

## 2015-06-30 LAB — POCT INR: INR: 2.8

## 2015-07-01 ENCOUNTER — Other Ambulatory Visit: Payer: Self-pay | Admitting: Obstetrics and Gynecology

## 2015-07-02 ENCOUNTER — Encounter (HOSPITAL_COMMUNITY): Payer: Medicare Other | Attending: Hematology & Oncology

## 2015-07-02 ENCOUNTER — Encounter (HOSPITAL_COMMUNITY): Payer: Medicare Other

## 2015-07-02 DIAGNOSIS — N184 Chronic kidney disease, stage 4 (severe): Secondary | ICD-10-CM | POA: Diagnosis not present

## 2015-07-02 DIAGNOSIS — D649 Anemia, unspecified: Secondary | ICD-10-CM

## 2015-07-02 DIAGNOSIS — D631 Anemia in chronic kidney disease: Secondary | ICD-10-CM | POA: Diagnosis not present

## 2015-07-02 DIAGNOSIS — E611 Iron deficiency: Secondary | ICD-10-CM

## 2015-07-02 LAB — HEMOGLOBIN: Hemoglobin: 11.3 g/dL — ABNORMAL LOW (ref 12.0–15.0)

## 2015-07-02 LAB — FERRITIN: Ferritin: 1007 ng/mL — ABNORMAL HIGH (ref 11–307)

## 2015-07-02 NOTE — Progress Notes (Signed)
Hemoglobin WNL no injection needed

## 2015-07-05 DIAGNOSIS — H02402 Unspecified ptosis of left eyelid: Secondary | ICD-10-CM | POA: Diagnosis not present

## 2015-07-05 DIAGNOSIS — H18899 Other specified disorders of cornea, unspecified eye: Secondary | ICD-10-CM | POA: Diagnosis not present

## 2015-07-05 DIAGNOSIS — H40003 Preglaucoma, unspecified, bilateral: Secondary | ICD-10-CM | POA: Diagnosis not present

## 2015-07-05 DIAGNOSIS — H40019 Open angle with borderline findings, low risk, unspecified eye: Secondary | ICD-10-CM | POA: Diagnosis not present

## 2015-07-05 LAB — HM DIABETES EYE EXAM

## 2015-07-08 ENCOUNTER — Other Ambulatory Visit: Payer: Self-pay

## 2015-07-08 ENCOUNTER — Ambulatory Visit (HOSPITAL_COMMUNITY): Payer: Medicare Other

## 2015-07-08 MED ORDER — MECLIZINE HCL 12.5 MG PO TABS: 12.5000 mg | ORAL_TABLET | Freq: Two times a day (BID) | ORAL | Status: DC | PRN

## 2015-07-08 MED ORDER — TOPROL XL 100 MG PO TB24: 100.0000 mg | ORAL_TABLET | Freq: Every day | ORAL | Status: DC

## 2015-07-09 ENCOUNTER — Other Ambulatory Visit: Payer: Self-pay | Admitting: Obstetrics and Gynecology

## 2015-07-09 ENCOUNTER — Other Ambulatory Visit: Payer: Self-pay

## 2015-07-09 MED ORDER — GABAPENTIN 300 MG PO CAPS: 300.0000 mg | ORAL_CAPSULE | Freq: Every day | ORAL | Status: DC

## 2015-07-12 ENCOUNTER — Other Ambulatory Visit: Payer: Self-pay

## 2015-07-12 MED ORDER — GABAPENTIN 300 MG PO CAPS: 300.0000 mg | ORAL_CAPSULE | Freq: Two times a day (BID) | ORAL | Status: DC

## 2015-07-16 ENCOUNTER — Other Ambulatory Visit: Payer: Self-pay | Admitting: Family Medicine

## 2015-07-21 ENCOUNTER — Other Ambulatory Visit: Payer: Self-pay

## 2015-07-21 MED ORDER — SYNTHROID 75 MCG PO TABS: ORAL_TABLET | ORAL | Status: DC

## 2015-07-21 MED ORDER — DIGOXIN 62.5 MCG PO TABS: ORAL_TABLET | ORAL | Status: DC

## 2015-07-22 ENCOUNTER — Encounter (HOSPITAL_BASED_OUTPATIENT_CLINIC_OR_DEPARTMENT_OTHER): Payer: Medicare Other

## 2015-07-22 VITALS — BP 130/48 | HR 69 | Temp 97.8°F | Resp 20

## 2015-07-22 DIAGNOSIS — D649 Anemia, unspecified: Secondary | ICD-10-CM

## 2015-07-22 DIAGNOSIS — N184 Chronic kidney disease, stage 4 (severe): Secondary | ICD-10-CM

## 2015-07-22 DIAGNOSIS — D631 Anemia in chronic kidney disease: Secondary | ICD-10-CM | POA: Diagnosis not present

## 2015-07-22 LAB — HEMOGLOBIN: Hemoglobin: 10.6 g/dL — ABNORMAL LOW (ref 12.0–15.0)

## 2015-07-22 MED ORDER — EPOETIN ALFA 40000 UNIT/ML IJ SOLN
40000.0000 [IU] | Freq: Once | INTRAMUSCULAR | Status: AC
  Administered 2015-07-22: 40000 [IU] via SUBCUTANEOUS
  Filled 2015-07-22: qty 1

## 2015-07-22 NOTE — Progress Notes (Signed)
[name] [name] presents today for injection per MD orders. Procrit 40,000 units administered SQ in right Abdomen. Administration without incident. Patient tolerated well.  

## 2015-07-22 NOTE — Patient Instructions (Signed)
[place] at [facility] Discharge Instructions  RECOMMENDATIONS MADE BY THE CONSULTANT AND ANY TEST RESULTS WILL BE SENT TO YOUR REFERRING PHYSICIAN.  Hemoglobin 10.6. Procrit 40,000 units given as ordered. Return as scheduled.  Thank you for choosing [place] at [facility] to provide your oncology and hematology care.  To afford each patient quality time with our provider, please arrive at least 15 minutes before your scheduled appointment time.    You need to re-schedule your appointment should you arrive 10 or more minutes late.  We strive to give you quality time with our providers, and arriving late affects you and other patients whose appointments are after yours.  Also, if you no show three or more times for appointments you may be dismissed from the clinic at the providers discretion.     [facility], [facility]  Our hope is that these requests will decrease the amount of time that you wait before being seen by our physicians.       _____________________________________________________________  Should you have questions after your visit to [facility], please contact our office at [phone] between the hours of 8:30 a.m. and 4:30 p.m.  Voicemails left after 4:30 p.m. will not be returned until the following business day.  For prescription refill requests, have your pharmacy contact our office.

## 2015-07-23 NOTE — Progress Notes (Signed)
LABS DRAWN

## 2015-07-29 ENCOUNTER — Ambulatory Visit (HOSPITAL_COMMUNITY): Payer: Medicare Other

## 2015-08-07 ENCOUNTER — Other Ambulatory Visit: Payer: Self-pay | Admitting: Family Medicine

## 2015-08-09 ENCOUNTER — Other Ambulatory Visit: Payer: Self-pay | Admitting: Family Medicine

## 2015-08-10 ENCOUNTER — Encounter (HOSPITAL_COMMUNITY): Payer: Self-pay

## 2015-08-10 ENCOUNTER — Encounter (HOSPITAL_BASED_OUTPATIENT_CLINIC_OR_DEPARTMENT_OTHER): Payer: Medicare Other

## 2015-08-10 ENCOUNTER — Encounter (HOSPITAL_COMMUNITY): Payer: Medicare Other | Attending: Internal Medicine

## 2015-08-10 VITALS — BP 153/61 | HR 73 | Temp 97.7°F | Resp 18

## 2015-08-10 DIAGNOSIS — D649 Anemia, unspecified: Secondary | ICD-10-CM

## 2015-08-10 DIAGNOSIS — E611 Iron deficiency: Secondary | ICD-10-CM | POA: Diagnosis not present

## 2015-08-10 DIAGNOSIS — N184 Chronic kidney disease, stage 4 (severe): Secondary | ICD-10-CM

## 2015-08-10 DIAGNOSIS — D631 Anemia in chronic kidney disease: Secondary | ICD-10-CM

## 2015-08-10 DIAGNOSIS — D638 Anemia in other chronic diseases classified elsewhere: Secondary | ICD-10-CM | POA: Insufficient documentation

## 2015-08-10 LAB — HEMOGLOBIN: Hemoglobin: 10.4 g/dL — ABNORMAL LOW (ref 12.0–15.0)

## 2015-08-10 MED ORDER — EPOETIN ALFA 40000 UNIT/ML IJ SOLN
40000.0000 [IU] | Freq: Once | INTRAMUSCULAR | Status: AC
  Administered 2015-08-10: 40000 [IU] via SUBCUTANEOUS

## 2015-08-10 MED ORDER — EPOETIN ALFA 40000 UNIT/ML IJ SOLN
INTRAMUSCULAR | Status: AC
  Filled 2015-08-10: qty 1

## 2015-08-10 NOTE — Progress Notes (Signed)
[name] [name]'s reason for visit today is for an injection and labs as scheduled per MD orders.  Labs were drawn prior to administration of ordered medication.   [name] [name] also received procrit 40,000 units per MD orders; see [facility] for administration details.  [name] [name] tolerated all procedures well and without incident; questions were answered and patient was discharged.

## 2015-08-10 NOTE — Patient Instructions (Signed)
[place] at [facility] Discharge Instructions  RECOMMENDATIONS MADE BY THE CONSULTANT AND ANY TEST RESULTS WILL BE SENT TO YOUR REFERRING PHYSICIAN.  Hemoglobin 10.4 Procrit 40,000 units today Follow up as scheduled Please call the clinic if you have any questions or concerns  Thank you for choosing [place] at [facility] to provide your oncology and hematology care.  To afford each patient quality time with our provider, please arrive at least 15 minutes before your scheduled appointment time.    You need to re-schedule your appointment should you arrive 10 or more minutes late.  We strive to give you quality time with our providers, and arriving late affects you and other patients whose appointments are after yours.  Also, if you no show three or more times for appointments you may be dismissed from the clinic at the providers discretion.     Again, thank you for choosing [facility].  Our hope is that these requests will decrease the amount of time that you wait before being seen by our physicians.       _____________________________________________________________  Should you have questions after your visit to [facility], please contact our office at [phone] between the hours of 8:30 a.m. and 4:30 p.m.  Voicemails left after 4:30 p.m. will not be returned until the following business day.  For prescription refill requests, have your pharmacy contact our office.

## 2015-08-10 NOTE — Progress Notes (Signed)
LABS DRAWN

## 2015-08-11 ENCOUNTER — Other Ambulatory Visit (HOSPITAL_COMMUNITY): Payer: Medicare Other

## 2015-08-11 ENCOUNTER — Ambulatory Visit (HOSPITAL_COMMUNITY): Payer: Medicare Other

## 2015-08-11 ENCOUNTER — Ambulatory Visit (INDEPENDENT_AMBULATORY_CARE_PROVIDER_SITE_OTHER): Payer: Medicare Other | Admitting: *Deleted

## 2015-08-11 DIAGNOSIS — Z5181 Encounter for therapeutic drug level monitoring: Secondary | ICD-10-CM

## 2015-08-11 DIAGNOSIS — Z954 Presence of other heart-valve replacement: Secondary | ICD-10-CM | POA: Diagnosis not present

## 2015-08-11 DIAGNOSIS — Z7901 Long term (current) use of anticoagulants: Secondary | ICD-10-CM | POA: Diagnosis not present

## 2015-08-11 DIAGNOSIS — Z952 Presence of prosthetic heart valve: Secondary | ICD-10-CM

## 2015-08-11 LAB — POCT INR: INR: 2.2

## 2015-08-12 ENCOUNTER — Ambulatory Visit (HOSPITAL_COMMUNITY): Payer: Medicare Other

## 2015-08-12 ENCOUNTER — Other Ambulatory Visit (HOSPITAL_COMMUNITY): Payer: Medicare Other

## 2015-08-17 DIAGNOSIS — L851 Acquired keratosis [keratoderma] palmaris et plantaris: Secondary | ICD-10-CM | POA: Diagnosis not present

## 2015-08-17 DIAGNOSIS — L603 Nail dystrophy: Secondary | ICD-10-CM | POA: Diagnosis not present

## 2015-08-17 DIAGNOSIS — E1142 Type 2 diabetes mellitus with diabetic polyneuropathy: Secondary | ICD-10-CM | POA: Diagnosis not present

## 2015-08-23 ENCOUNTER — Encounter: Payer: PRIVATE HEALTH INSURANCE | Admitting: Family Medicine

## 2015-08-23 ENCOUNTER — Ambulatory Visit (HOSPITAL_COMMUNITY): Payer: Medicare Other

## 2015-08-23 ENCOUNTER — Other Ambulatory Visit: Payer: Self-pay | Admitting: Cardiovascular Disease

## 2015-08-23 NOTE — Telephone Encounter (Signed)
Rx request sent to pharmacy.  

## 2015-08-30 ENCOUNTER — Ambulatory Visit (INDEPENDENT_AMBULATORY_CARE_PROVIDER_SITE_OTHER): Payer: Medicare Other | Admitting: Cardiovascular Disease

## 2015-08-30 ENCOUNTER — Encounter: Payer: Self-pay | Admitting: Cardiovascular Disease

## 2015-08-30 VITALS — BP 142/72 | HR 73 | Resp 16 | Ht 67.0 in | Wt 231.5 lb

## 2015-08-30 DIAGNOSIS — I428 Other cardiomyopathies: Secondary | ICD-10-CM

## 2015-08-30 DIAGNOSIS — Z954 Presence of other heart-valve replacement: Secondary | ICD-10-CM

## 2015-08-30 DIAGNOSIS — I429 Cardiomyopathy, unspecified: Secondary | ICD-10-CM | POA: Diagnosis not present

## 2015-08-30 DIAGNOSIS — I5022 Chronic systolic (congestive) heart failure: Secondary | ICD-10-CM

## 2015-08-30 DIAGNOSIS — I10 Essential (primary) hypertension: Secondary | ICD-10-CM

## 2015-08-30 DIAGNOSIS — I255 Ischemic cardiomyopathy: Secondary | ICD-10-CM | POA: Diagnosis not present

## 2015-08-30 DIAGNOSIS — Z952 Presence of prosthetic heart valve: Secondary | ICD-10-CM

## 2015-08-30 DIAGNOSIS — E785 Hyperlipidemia, unspecified: Secondary | ICD-10-CM

## 2015-08-30 DIAGNOSIS — Z79899 Other long term (current) drug therapy: Secondary | ICD-10-CM | POA: Diagnosis not present

## 2015-08-30 DIAGNOSIS — N184 Chronic kidney disease, stage 4 (severe): Secondary | ICD-10-CM

## 2015-08-30 DIAGNOSIS — I442 Atrioventricular block, complete: Secondary | ICD-10-CM | POA: Diagnosis not present

## 2015-08-30 DIAGNOSIS — Z95810 Presence of automatic (implantable) cardiac defibrillator: Secondary | ICD-10-CM

## 2015-08-30 NOTE — Patient Instructions (Signed)
Your physician recommends that you return for lab work in: AT [place] monitoring is used to monitor your Pacemaker from home. This monitoring reduces the number of office visits required to check your device to one time per year. It allows [place] to monitor the functioning of your device to ensure it is working properly. You are scheduled for a device check from home on November 29, 2015. You may send your transmission at any time that day. If you have a wireless device, the transmission will be sent automatically. After your physician reviews your transmission, you will receive a postcard with your next transmission date.  Dr. Sallyanne Kuster recommends that you schedule a follow-up appointment in: 6 MONTHS

## 2015-08-30 NOTE — Progress Notes (Signed)
Patient ID: [name] [name], female   DOB: [birth year], 81 y.o.   MRN: [id number]     Cardiology Office Note   Date:  08/31/2015   ID:  Crystle Espitia, DOB 1931-11-05, MRN [id number]  PCP:  [name], MD  Cardiologist:  Cristopher Peru, MD;  Sanda Klein, MD   Chief Complaint  Patient presents with  . Shortness of Breath    only when walking alot, pt states only once in a while she may get dizzy   . Annual Exam    pt states no chest pain   . Edema    just a little in her ankles       History of Present Illness: [name] [name] is a 81 y.o. female who presents for Follow-up of valvular heart disease, complete heart block and chronic combined systolic and diastolic heart failure.  She generally feels well. She only complains of dyspnea after greater than usual activity (NYHA functional class II) and has edema in her ankles only towards the end of the day. She has not required recent hospitalization or any adjustment in the dose of her diuretic. She has not had any bleeding complications on chronic warfarin anticoagulation has not had any neurological events.  Last September the left ventricular lead pacing vector was switched by Dr. Lovena Le to the LV2-LV4 configuration with a pacing threshold that was superior to the initial setting. At that time it was 1.0 1.5 ms pulse width. Using autocapture, her device has increased the left ventricular pacing outputs to 4.75 V at 1.5 ms pulse width after detecting a large jump in pacing threshold and reports inability to capture more recently. However, manual testing of left ventricular pacing threshold is substantially lower when checked manually today: 1.25 V at 1.5 ms pulse width. Despite these issues, there is still 99.5% biventricular pacing efficiency and clinically she is doing quite well. Her thoracic impedance (optivol) has been very steady. There is also roughly 79% atrial pacing. There are no meaningful episodes of high ventricular rate or  atrial mode switch, although there are incessant very brief episodes of probable atrial tachycardia  Due to the jump in left ventricular pacing threshold her generator longevity was estimated to be 1.8 years. After adjusting the output today the reported generator longevity will be 5 years.  She is pacemaker dependent due to complete heart block following aortic and mitral valve replacement with mechanical prostheses. (AVR 2001, MVR 2004, both [employer] prostheses, surgeries performed in [place]). She has severe cardiomyopathy with an estimated left ventricular ejection fraction of 25%, unchanged since 2004. She did not have any coronary artery disease by angiography before her first operation. In May 2016 her initial pacemaker lead was extracted and she received a CRT-D Medtronic Viva XT device by Dr. Cristopher Peru.  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 She has diabetes mellitus and hyperlipidemia and hyperuricemia. Her most recent hemoglobin on November 15 was 10.4. Her ferritin is quite high, not consistent with iron deficiency. She has chronic kidney disease and her most recent creatinine was 2.34, with an estimated GFR of around 20 mL/minute. Hemoglobin A1c earlier this year was 6.1%.  Past Medical History  Diagnosis Date  . DJD (degenerative joint disease) of lumbar spine   . Esophageal reflux   . Other and unspecified hyperlipidemia   . Non-ischemic cardiomyopathy ([place])     a. s/p MDT CRTD  . Obesity, unspecified   .  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 IDA (iron deficiency anemia)     Parenteral iron/[name]  . Adenomatous polyp of colon 12/16/2002    Dr. [name]/[facility]  . Hyperlipemia   . COPD (chronic obstructive pulmonary disease) ([place])        . CHF (congestive heart failure) ([place])        . Constipation        . Gout          . CHB (complete heart block) ([place]) 10/07/2014       . Insomnia        . Unspecified hypothyroidism        . Vertigo        . DM (diabetes mellitus) ([place]) 2006  . Chronic kidney disease, stage I 2006  . Thyroid cancer ([place])     remote thyroidectomy, no recurrence, pt denies in 2016 thyroid cancer  . Oxygen deficiency 2014    nocturnal;  . Heart murmur   . Allergy     Past Surgical History  Procedure Laterality Date  . Thyroidectomy    . Pacemaker placed  2004  . Aortic and mitral valve replacement  2001  . Right breast cyst removed      benign   . Right cataract removed      2005  . Defibrillator implanted 2006    . Cardiac valve replacement    . Cardiac catheterization    . Insert / replace / remove pacemaker    . Esophagogastroduodenoscopy  06/12/2007    Rourk- normal esophagus, small hiatal hernia, otherwise normal stomach, D1, D2  . Colonoscopy  06/12/2007    Rourk- Normal rectum/Normal colon  . Colonoscopy  12/16/02    small hemorrhoids  . Colonoscopy  06/20/2012    Procedure: COLONOSCOPY;  Surgeon: Daneil Dolin, MD;  Location: [facility];  Service: Endoscopy;  Laterality: N/A;  10:45  . Doppler echocardiography  2012  . Doppler echocardiography  05,06,07,08,09,2011  . Bi-ventricular implantable cardioverter defibrillator upgrade N/A 01/18/2015    Procedure: BI-VENTRICULAR IMPLANTABLE CARDIOVERTER DEFIBRILLATOR UPGRADE;  Surgeon: Evans Lance, MD;  Location: [facility] CATH LAB;  Service: Cardiovascular;  Laterality: N/A;  . Icd lead removal Left 02/08/2015    Procedure: ICD LEAD REMOVAL;  Surgeon: Evans Lance, MD;  Location[facility] OR;  Service: Cardiovascular;  Laterality: Left;  "Will plan extraction and insertion of a BiV PM"  **Dr. Roxan Hockey backing up case**  . Implantable cardioverter defibrillator (icd) generator change Left 02/08/2015    Procedure: ICD GENERATOR CHANGE;  Surgeon: Evans Lance, MD;  Location: [place];  Service: Cardiovascular;  Laterality:  Left;  Marland Kitchen Eye surgery Right 2005  . Eye surgery Left 2006  . Pacemaker insertion  May 2016     Current Outpatient Prescriptions  Medication Sig Dispense Refill  . acetaminophen (TYLENOL) 500 MG tablet Take 500 mg by mouth every 6 (six) hours as needed (pain).    . ADVAIR DISKUS 100-50 MCG/DOSE AEPB inhale 1 dose by mouth twice a day 60 each 4  . albuterol (PROVENTIL HFA;VENTOLIN HFA) 108 (90 BASE) MCG/ACT inhaler Inhale 2 puffs into the lungs every 6 (six) hours as needed for wheezing or shortness of breath. 1 Inhaler 3  . albuterol (PROVENTIL) (2.5 MG/3ML) 0.083% nebulizer solution INHALE 1 VIAL VIA NEBULIZER THREE TIMES DAILY. (Patient taking differently: INHALE 1 VIAL VIA NEBULIZER TWO TIMES DAILY.) 300 mL 3  . allopurinol (  ZYLOPRIM) 100 MG tablet take 1 tablet by mouth once daily 90 tablet 0  . cholecalciferol (VITAMIN D) 1000 UNITS tablet Take 1,000 Units by mouth daily.    . clindamycin (CLEOCIN) 300 MG capsule Take 2 capsules (600 mg total) by mouth See admin instructions. Take 2 capsules (600 mg) one hour prior to dental appointment 10 capsule 0  . Digoxin (LANOXIN) 62.5 MCG TABS Take 1 tablet by mouth  every day 90 tablet 1  . docusate sodium (COLACE) 100 MG capsule Take 300 mg by mouth at bedtime.    Marland Kitchen epoetin alfa (EPOGEN,PROCRIT) 60454 UNIT/ML injection Inject 40,000 Units into the skin once.    . fluticasone (FLONASE) 50 MCG/ACT nasal spray instill 2 sprays into each nostril once daily (Patient taking differently: instill 2 sprays into each nostril once daily as needed for congestion) 16 g 2  . folic acid (FOLVITE) 1 MG tablet take 1 tablet by mouth once daily 100 tablet 6  . furosemide (LASIX) 40 MG tablet Take 1 tablet by mouth  daily 90 tablet 2  . gabapentin (NEURONTIN) 300 MG capsule Take 1 capsule (300 mg total) by mouth 2 (two) times daily. 180 capsule 1  . hydrALAZINE (APRESOLINE) 25 MG tablet Take 1 tablet (25 mg total) by mouth 2 (two) times daily. 270 tablet 3  .  HYDROcodone-acetaminophen (NORCO) 7.5-325 MG per tablet Take 0.5-1 tablets by mouth 2 (two) times daily as needed (pain).  30 tablet 0  . isosorbide dinitrate (ISOCHRON) 40 MG CR tablet Take one-half tablet by  mouth twice a day 90 tablet 0  . losartan (COZAAR) 50 MG tablet Take 1 tablet by mouth  daily 90 tablet 0  . meclizine (ANTIVERT) 12.5 MG tablet Take 1 tablet (12.5 mg total) by mouth 2 (two) times daily as needed for dizziness. 30 tablet 2  . metolazone (ZAROXOLYN) 2.5 MG tablet Take 1 tablet by mouth  every week 5 tablet 4  . montelukast (SINGULAIR) 10 MG tablet   0  . ondansetron (ZOFRAN) 4 MG tablet take 1 tablet by mouth once daily 90 tablet 0  . Polyethyl [name] (SYSTANE OP) Place 1 drop into both eyes daily as needed (dry eyes).    . polyethylene glycol powder (MIRALAX) powder Take 17 g by mouth daily as needed for mild constipation.     . potassium chloride SA (KLOR-CON M20) 20 MEQ tablet Take 1 tablet by mouth  daily 90 tablet 1  . promethazine-dextromethorphan (PROMETHAZINE-DM) 6.25-15 MG/5ML syrup Take 5 mLs by mouth at bedtime as needed for cough. 118 mL 0  . SYNTHROID 75 MCG tablet Take 1 tablet by mouth  daily 90 tablet 1  . temazepam (RESTORIL) 30 MG capsule take 1 capsule at bedtime if needed 30 capsule 2  . TOPROL XL 100 MG 24 hr tablet Take 1 tablet (100 mg total) by mouth daily. Take with or immediately following a meal. 90 tablet 1  . Vitamin Mixture (ESTER-C PO) Take 1 tablet by mouth daily as needed (for immune system boost).    . VYTORIN 10-20 MG per tablet Take 1 tablet by mouth 3  times weekly 39 tablet 2  . warfarin (COUMADIN) 5 MG tablet Take 2 tablets daily or as directed by coumadin clinic 10 tablet 1   No current facility-administered medications for this visit.   Facility-Administered Medications Ordered in Other Visits  Medication Dose Route Frequency Provider Last Rate Last Dose  . epoetin alfa (EPOGEN,PROCRIT) injection 40,000 Units  40,000  Units Subcutaneous Once Farrel Gobble, MD        Allergies:   Aspirin; Fentanyl; Niacin; and Penicillins    Social History:  The patient  reports that she quit smoking about 28 years ago. Her smoking use included Cigarettes. She has a 30 pack-year smoking history. She has never used smokeless tobacco. She reports that she does not drink alcohol or use illicit drugs.   Family History:  The patient's family history includes Cancer in her mother; Diabetes in her brother and brother; Heart disease in her brother, brother, father, and sister; Hypertension in her brother.    ROS:  Please see the history of present illness.    Otherwise, review of systems positive for none.   All other systems are reviewed and negative.    PHYSICAL EXAM: VS:  BP 142/72 mmHg  Pulse 73  Resp 16  Ht 5\' 7"  (1.702 m)  Wt 231 lb 8 oz (105.008 kg)  BMI 36.25 kg/m2 , BMI Body mass index is 36.25 kg/(m^2).  General: Alert, oriented x3, no distress Head: no evidence of trauma, PERRL, EOMI, no exophtalmos or lid lag, no myxedema, no xanthelasma; normal ears, nose and oropharynx Neck: normal jugular venous pulsations and no hepatojugular reflux; brisk carotid pulses without delay and no carotid bruits Chest: clear to auscultation, no signs of consolidation by percussion or palpation, normal fremitus, symmetrical and full respiratory excursions, healthy appearing defibrillator site without any evidence of residual hematoma or any signs of infection Cardiovascular: normal position and quality of the apical impulse, regular rhythm, very crisp prosthetic valve clicks, 2/6 aortic ejection murmur heard on both sides of the sternum, no diastolic murmurs, rubs or gallops Abdomen: no tenderness or distention, no masses by palpation, no abnormal pulsatility or arterial bruits, normal bowel sounds, no hepatosplenomegaly Extremities: no clubbing, cyanosis or edema; 2+ radial, ulnar and brachial pulses bilaterally; 2+ right  femoral, posterior tibial and dorsalis pedis pulses; 2+ left femoral, posterior tibial and dorsalis pedis pulses; no subclavian or femoral bruits Neurological: grossly nnfocal Psych: euthymic mood, full affect   EKG:  EKG is not ordered today.    Recent Labs: 02/15/2015: ALT 12; BUN 54*; Creat 2.34*; Potassium 4.7; Sodium 142; TSH 3.070 05/19/2015: Platelets 209 08/10/2015: Hemoglobin 10.4*    Lipid Panel    Component Value Date/Time   CHOL 130 09/14/2014 1239   TRIG 132 09/14/2014 1239   HDL 35* 09/14/2014 1239   CHOLHDL 3.7 09/14/2014 1239   VLDL 26 09/14/2014 1239   LDLCALC 69 09/14/2014 1239      Wt Readings from Last 3 Encounters:  08/30/15 231 lb 8 oz (105.008 kg)  06/21/15 232 lb (105.235 kg)  06/10/15 224 lb (101.606 kg)     ASSESSMENT AND PLAN:  1. Chronic combined systolic and diastolic heart failure, NYHA functional class II, euvolemic  2. Complete heart block, pacemaker dependent  3. S/P CRT-D with what appears to be artifactual sudden increase in left ventricular lead pacing thresholds leading to excessive battery drain. Manual left ventricular lead testing today shows a much lower pacing threshold. If this phenomenon happens again, will probably need to switch to a fixed pacing output. Have asked her device clinic staff to really test the device remotely in 1 week to make sure that the pacing threshold has not yet again been escalated unnecessarily. Will discuss options with Dr. Lovena Le    Current medicines are reviewed at length with the patient today.  The patient does not have concerns regarding medicines.  The following changes have been made:  no change  Labs/ tests ordered today include:  Orders Placed This Encounter  Procedures  . Lipid panel  . Comprehensive metabolic panel    Patient Instructions  Your physician recommends that you return for lab work in: AT [place] monitoring is used to monitor your  Pacemaker from home. This monitoring reduces the number of office visits required to check your device to one time per year. It allows [place] to monitor the functioning of your device to ensure it is working properly. You are scheduled for a device check from home on November 29, 2015. You may send your transmission at any time that day. If you have a wireless device, the transmission will be sent automatically. After your physician reviews your transmission, you will receive a postcard with your next transmission date.  Dr. Sallyanne Kuster recommends that you schedule a follow-up appointment in: [place], Nuha Degner, MD  08/31/2015 3:21 PM    Sanda Klein, MD, [facility] [phone] office [phone] pager

## 2015-08-31 ENCOUNTER — Encounter: Payer: Self-pay | Admitting: Cardiovascular Disease

## 2015-09-01 ENCOUNTER — Ambulatory Visit (INDEPENDENT_AMBULATORY_CARE_PROVIDER_SITE_OTHER): Payer: Medicare Other | Admitting: *Deleted

## 2015-09-01 ENCOUNTER — Other Ambulatory Visit: Payer: Self-pay | Admitting: Family Medicine

## 2015-09-01 DIAGNOSIS — Z7901 Long term (current) use of anticoagulants: Secondary | ICD-10-CM | POA: Diagnosis not present

## 2015-09-01 DIAGNOSIS — Z5181 Encounter for therapeutic drug level monitoring: Secondary | ICD-10-CM | POA: Diagnosis not present

## 2015-09-01 DIAGNOSIS — Z952 Presence of prosthetic heart valve: Secondary | ICD-10-CM

## 2015-09-01 DIAGNOSIS — Z954 Presence of other heart-valve replacement: Secondary | ICD-10-CM | POA: Diagnosis not present

## 2015-09-01 LAB — POCT INR: INR: 2.3

## 2015-09-02 ENCOUNTER — Encounter (HOSPITAL_COMMUNITY): Payer: Self-pay

## 2015-09-02 ENCOUNTER — Encounter (HOSPITAL_COMMUNITY): Payer: Medicare Other

## 2015-09-02 ENCOUNTER — Encounter (HOSPITAL_COMMUNITY): Payer: Medicare Other | Attending: Internal Medicine

## 2015-09-02 VITALS — BP 127/65 | HR 70 | Temp 98.3°F | Resp 16

## 2015-09-02 DIAGNOSIS — D649 Anemia, unspecified: Secondary | ICD-10-CM

## 2015-09-02 DIAGNOSIS — N184 Chronic kidney disease, stage 4 (severe): Secondary | ICD-10-CM

## 2015-09-02 DIAGNOSIS — D631 Anemia in chronic kidney disease: Secondary | ICD-10-CM

## 2015-09-02 DIAGNOSIS — E611 Iron deficiency: Secondary | ICD-10-CM

## 2015-09-02 DIAGNOSIS — D638 Anemia in other chronic diseases classified elsewhere: Secondary | ICD-10-CM | POA: Diagnosis not present

## 2015-09-02 LAB — FERRITIN: Ferritin: 1025 ng/mL — ABNORMAL HIGH (ref 11–307)

## 2015-09-02 LAB — HEMOGLOBIN: Hemoglobin: 10.4 g/dL — ABNORMAL LOW (ref 12.0–15.0)

## 2015-09-02 MED ORDER — EPOETIN ALFA 40000 UNIT/ML IJ SOLN
INTRAMUSCULAR | Status: AC
  Filled 2015-09-02: qty 1

## 2015-09-02 MED ORDER — EPOETIN ALFA 40000 UNIT/ML IJ SOLN
40000.0000 [IU] | Freq: Once | INTRAMUSCULAR | Status: AC
  Administered 2015-09-02: 40000 [IU] via SUBCUTANEOUS

## 2015-09-02 NOTE — Patient Instructions (Signed)
..  [place] at [facility] Discharge Instructions  RECOMMENDATIONS MADE BY THE CONSULTANT AND ANY TEST RESULTS WILL BE SENT TO YOUR REFERRING PHYSICIAN.  Procrit 40,000 units today Your hemoglobin was 10.4 Return every 3 weeks as ordered  Thank you for choosing [place] at [facility] to provide your oncology and hematology care.  To afford each patient quality time with our provider, please arrive at least 15 minutes before your scheduled appointment time.    You need to re-schedule your appointment should you arrive 10 or more minutes late.  We strive to give you quality time with our providers, and arriving late affects you and other patients whose appointments are after yours.  Also, if you no show three or more times for appointments you may be dismissed from the clinic at the providers discretion.     Again, thank you for choosing [facility].  Our hope is that these requests will decrease the amount of time that you wait before being seen by our physicians.       _____________________________________________________________  Should you have questions after your visit to [facility], please contact our office at [phone] between the hours of 8:30 a.m. and 4:30 p.m.  Voicemails left after 4:30 p.m. will not be returned until the following business day.  For prescription refill requests, have your pharmacy contact our office.

## 2015-09-02 NOTE — Progress Notes (Signed)
..  [name] [name] presents today for injection per the provider's orders.  procrit administration without incident; see MAR for injection details.  Patient tolerated procedure well and without incident.  No questions or complaints noted at this time.

## 2015-09-03 ENCOUNTER — Other Ambulatory Visit: Payer: Self-pay | Admitting: Family Medicine

## 2015-09-06 ENCOUNTER — Ambulatory Visit (INDEPENDENT_AMBULATORY_CARE_PROVIDER_SITE_OTHER): Payer: Medicare Other | Admitting: *Deleted

## 2015-09-06 ENCOUNTER — Ambulatory Visit: Payer: Medicare Other | Admitting: Cardiovascular Disease

## 2015-09-06 ENCOUNTER — Telehealth: Payer: Self-pay | Admitting: Cardiology

## 2015-09-06 DIAGNOSIS — I429 Cardiomyopathy, unspecified: Secondary | ICD-10-CM

## 2015-09-06 DIAGNOSIS — I5022 Chronic systolic (congestive) heart failure: Secondary | ICD-10-CM | POA: Diagnosis not present

## 2015-09-06 DIAGNOSIS — I428 Other cardiomyopathies: Secondary | ICD-10-CM

## 2015-09-06 NOTE — Progress Notes (Signed)
Remote ICD transmission.   

## 2015-09-06 NOTE — Telephone Encounter (Signed)
Spoke with pt and reminded pt of remote transmission that is due today. Pt verbalized understanding.   

## 2015-09-08 ENCOUNTER — Telehealth: Payer: Self-pay

## 2015-09-08 DIAGNOSIS — E079 Disorder of thyroid, unspecified: Secondary | ICD-10-CM | POA: Diagnosis not present

## 2015-09-08 DIAGNOSIS — D649 Anemia, unspecified: Secondary | ICD-10-CM | POA: Diagnosis not present

## 2015-09-08 DIAGNOSIS — Z8719 Personal history of other diseases of the digestive system: Secondary | ICD-10-CM | POA: Diagnosis not present

## 2015-09-08 DIAGNOSIS — M25561 Pain in right knee: Secondary | ICD-10-CM | POA: Diagnosis not present

## 2015-09-08 DIAGNOSIS — Z6836 Body mass index (BMI) 36.0-36.9, adult: Secondary | ICD-10-CM | POA: Diagnosis not present

## 2015-09-08 DIAGNOSIS — E119 Type 2 diabetes mellitus without complications: Secondary | ICD-10-CM | POA: Diagnosis not present

## 2015-09-08 NOTE — Telephone Encounter (Signed)
pls print narc use, I think Dr Luna Glasgow prescribeds medication for her

## 2015-09-08 NOTE — Telephone Encounter (Signed)
120 hydrocodone 7.5/325 from [place] collected on 12/8.

## 2015-09-08 NOTE — Telephone Encounter (Signed)
Patient aware.

## 2015-09-08 NOTE — Telephone Encounter (Signed)
pls let [name] [name] know that she already has the pain medication she needs from Dr Luna Glasgow, if this is not working she needs to call him to discuss this furhter / sched an appt to seehim

## 2015-09-08 NOTE — Telephone Encounter (Signed)
Has been having a hurting in her right hip for the past 3 weeks. Some days hurts more than others. Aching pain. Some days it hurts pretty bad and hurts to even lean over to tie her shoes. Wants to know if she can have something to help it. Please advise

## 2015-09-09 ENCOUNTER — Ambulatory Visit (HOSPITAL_COMMUNITY): Payer: Medicare Other

## 2015-09-14 DIAGNOSIS — M7061 Trochanteric bursitis, right hip: Secondary | ICD-10-CM | POA: Diagnosis not present

## 2015-09-14 DIAGNOSIS — Z6836 Body mass index (BMI) 36.0-36.9, adult: Secondary | ICD-10-CM | POA: Diagnosis not present

## 2015-09-14 DIAGNOSIS — D649 Anemia, unspecified: Secondary | ICD-10-CM | POA: Diagnosis not present

## 2015-09-14 DIAGNOSIS — E079 Disorder of thyroid, unspecified: Secondary | ICD-10-CM | POA: Diagnosis not present

## 2015-09-14 DIAGNOSIS — E119 Type 2 diabetes mellitus without complications: Secondary | ICD-10-CM | POA: Diagnosis not present

## 2015-09-14 DIAGNOSIS — Z8719 Personal history of other diseases of the digestive system: Secondary | ICD-10-CM | POA: Diagnosis not present

## 2015-09-15 ENCOUNTER — Telehealth: Payer: Self-pay | Admitting: Family Medicine

## 2015-09-15 ENCOUNTER — Other Ambulatory Visit: Payer: Self-pay

## 2015-09-15 ENCOUNTER — Ambulatory Visit (INDEPENDENT_AMBULATORY_CARE_PROVIDER_SITE_OTHER): Payer: Medicare Other | Admitting: Pharmacist

## 2015-09-15 DIAGNOSIS — Z954 Presence of other heart-valve replacement: Secondary | ICD-10-CM | POA: Diagnosis not present

## 2015-09-15 DIAGNOSIS — Z5181 Encounter for therapeutic drug level monitoring: Secondary | ICD-10-CM

## 2015-09-15 DIAGNOSIS — Z7901 Long term (current) use of anticoagulants: Secondary | ICD-10-CM

## 2015-09-15 DIAGNOSIS — Z952 Presence of prosthetic heart valve: Secondary | ICD-10-CM

## 2015-09-15 LAB — POCT INR: INR: 2.9

## 2015-09-15 MED ORDER — CLINDAMYCIN HCL 300 MG PO CAPS: ORAL_CAPSULE | ORAL | Status: DC

## 2015-09-15 NOTE — Telephone Encounter (Signed)
[name] [name] states that she has a dentist appointment today at 3:00 and she is supposed to take an Antibiotic an hour before and she does not have any, please advise?

## 2015-09-15 NOTE — Telephone Encounter (Signed)
Clindamycin sent to [facility]  Patient aware.

## 2015-09-18 ENCOUNTER — Other Ambulatory Visit: Payer: Self-pay | Admitting: Cardiovascular Disease

## 2015-09-21 ENCOUNTER — Other Ambulatory Visit: Payer: Self-pay | Admitting: Family Medicine

## 2015-09-21 NOTE — Telephone Encounter (Signed)
Rx request sent to pharmacy.  

## 2015-09-23 ENCOUNTER — Encounter (HOSPITAL_BASED_OUTPATIENT_CLINIC_OR_DEPARTMENT_OTHER): Payer: Medicare Other

## 2015-09-23 ENCOUNTER — Other Ambulatory Visit (HOSPITAL_COMMUNITY)
Admission: RE | Admit: 2015-09-23 | Discharge: 2015-09-23 | Disposition: A | Discharge status: Home / Self Care | Payer: Medicare Other | Source: Ambulatory Visit | Attending: Cardiovascular Disease | Admitting: Cardiovascular Disease

## 2015-09-23 ENCOUNTER — Encounter (HOSPITAL_COMMUNITY): Payer: Medicare Other

## 2015-09-23 DIAGNOSIS — E611 Iron deficiency: Secondary | ICD-10-CM | POA: Diagnosis not present

## 2015-09-23 DIAGNOSIS — N184 Chronic kidney disease, stage 4 (severe): Secondary | ICD-10-CM

## 2015-09-23 DIAGNOSIS — Z79899 Other long term (current) drug therapy: Secondary | ICD-10-CM | POA: Insufficient documentation

## 2015-09-23 DIAGNOSIS — D631 Anemia in chronic kidney disease: Secondary | ICD-10-CM | POA: Diagnosis not present

## 2015-09-23 DIAGNOSIS — E785 Hyperlipidemia, unspecified: Secondary | ICD-10-CM | POA: Diagnosis not present

## 2015-09-23 DIAGNOSIS — D638 Anemia in other chronic diseases classified elsewhere: Secondary | ICD-10-CM | POA: Diagnosis not present

## 2015-09-23 DIAGNOSIS — D649 Anemia, unspecified: Secondary | ICD-10-CM

## 2015-09-23 LAB — LIPID PANEL
Cholesterol: 146 mg/dL (ref 0–200)
HDL: 40 mg/dL — ABNORMAL LOW (ref >40)
LDL Cholesterol: 84 mg/dL (ref 0–99)
Total CHOL/HDL Ratio: 3.7 RATIO
Triglycerides: 108 mg/dL (ref <150)
VLDL: 22 mg/dL (ref 0–40)

## 2015-09-23 LAB — COMPREHENSIVE METABOLIC PANEL
ALT: 24 U/L (ref 14–54)
AST: 33 U/L (ref 15–41)
Albumin: 4.1 g/dL (ref 3.5–5.0)
Alkaline Phosphatase: 65 U/L (ref 38–126)
Anion gap: 4 — ABNORMAL LOW (ref 5–15)
BUN: 76 mg/dL — ABNORMAL HIGH (ref 6–20)
CO2: 29 mmol/L (ref 22–32)
Calcium: 9.2 mg/dL (ref 8.9–10.3)
Chloride: 102 mmol/L (ref 101–111)
Creatinine, Ser: 1.74 mg/dL — ABNORMAL HIGH (ref 0.44–1.00)
GFR calc Af Amer: 30 mL/min — ABNORMAL LOW (ref >60)
GFR calc non Af Amer: 26 mL/min — ABNORMAL LOW (ref >60)
Glucose, Bld: 102 mg/dL — ABNORMAL HIGH (ref 65–99)
Potassium: 4.7 mmol/L (ref 3.5–5.1)
Sodium: 135 mmol/L (ref 135–145)
Total Bilirubin: 0.4 mg/dL (ref 0.3–1.2)
Total Protein: 8.1 g/dL (ref 6.5–8.1)

## 2015-09-23 LAB — FERRITIN: Ferritin: 1109 ng/mL — ABNORMAL HIGH (ref 11–307)

## 2015-09-23 LAB — HEMOGLOBIN: Hemoglobin: 11.7 g/dL — ABNORMAL LOW (ref 12.0–15.0)

## 2015-09-23 NOTE — Progress Notes (Signed)
[name] presented for labwork. Labs per MD order drawn via Peripheral Line 23 gauge needle inserted in right antecubital.  Good blood return present. Procedure without incident.  Needle removed intact. Patient tolerated procedure well.    Hemoglobin 11.7 today. Procrit not given, treatment parameters not met.

## 2015-09-23 NOTE — Patient Instructions (Signed)
[place] at [facility] Discharge Instructions  RECOMMENDATIONS MADE BY THE CONSULTANT AND ANY TEST RESULTS WILL BE SENT TO YOUR REFERRING PHYSICIAN.  Hemoglobin 11.7 today. Procrit not needed. Return as scheduled.  Thank you for choosing [place] at [facility] to provide your oncology and hematology care.  To afford each patient quality time with our provider, please arrive at least 15 minutes before your scheduled appointment time.    You need to re-schedule your appointment should you arrive 10 or more minutes late.  We strive to give you quality time with our providers, and arriving late affects you and other patients whose appointments are after yours.  Also, if you no show three or more times for appointments you may be dismissed from the clinic at the providers discretion.     Again, thank you for choosing [facility].  Our hope is that these requests will decrease the amount of time that you wait before being seen by our physicians.       _____________________________________________________________  Should you have questions after your visit to [facility], please contact our office at [phone] between the hours of 8:30 a.m. and 4:30 p.m.  Voicemails left after 4:30 p.m. will not be returned until the following business day.  For prescription refill requests, have your pharmacy contact our office.

## 2015-09-28 ENCOUNTER — Telehealth: Payer: Self-pay

## 2015-09-28 MED ORDER — BENZONATATE 100 MG PO CAPS: 100.0000 mg | ORAL_CAPSULE | Freq: Two times a day (BID) | ORAL | Status: DC

## 2015-09-28 NOTE — Telephone Encounter (Signed)
Tessalon perles twice daily as needed # 20 with pt education please

## 2015-09-28 NOTE — Telephone Encounter (Signed)
Patient aware.   Will call back if symptoms worsen.   Medication sent to pharmacy.

## 2015-09-28 NOTE — Addendum Note (Signed)
Addended by: Denman George B on: 09/28/2015 03:51 PM   Modules accepted: Orders

## 2015-09-30 ENCOUNTER — Other Ambulatory Visit: Payer: Self-pay | Admitting: Family Medicine

## 2015-09-30 ENCOUNTER — Ambulatory Visit (HOSPITAL_COMMUNITY): Payer: Medicare Other

## 2015-10-01 ENCOUNTER — Telehealth: Payer: Self-pay | Admitting: Family Medicine

## 2015-10-01 NOTE — Telephone Encounter (Signed)
Patient is asking for a prescription called in for cough medication, she has started coughing a lot, please advise?

## 2015-10-01 NOTE — Telephone Encounter (Signed)
Please advise 

## 2015-10-04 MED ORDER — BENZONATATE 100 MG PO CAPS: 100.0000 mg | ORAL_CAPSULE | Freq: Two times a day (BID) | ORAL | Status: DC | PRN

## 2015-10-04 MED ORDER — PROMETHAZINE-DM 6.25-15 MG/5ML PO SYRP: ORAL_SOLUTION | ORAL | Status: DC

## 2015-10-04 MED ORDER — AZELASTINE HCL 0.1 % NA SOLN: 2.0000 | Freq: Two times a day (BID) | NASAL | Status: DC

## 2015-10-04 NOTE — Telephone Encounter (Signed)
Phenergan dm sent to [place] [facility] on 10/04/2015, I spoke directly wiyth pt today, denies fever or chills, states coughing all weekend, tessalon perles and astelin also sent in

## 2015-10-06 ENCOUNTER — Ambulatory Visit (INDEPENDENT_AMBULATORY_CARE_PROVIDER_SITE_OTHER): Payer: Medicare Other | Admitting: *Deleted

## 2015-10-06 DIAGNOSIS — Z7901 Long term (current) use of anticoagulants: Secondary | ICD-10-CM | POA: Diagnosis not present

## 2015-10-06 DIAGNOSIS — Z954 Presence of other heart-valve replacement: Secondary | ICD-10-CM | POA: Diagnosis not present

## 2015-10-06 DIAGNOSIS — Z5181 Encounter for therapeutic drug level monitoring: Secondary | ICD-10-CM | POA: Diagnosis not present

## 2015-10-06 DIAGNOSIS — Z952 Presence of prosthetic heart valve: Secondary | ICD-10-CM

## 2015-10-06 LAB — POCT INR: INR: 3

## 2015-10-08 ENCOUNTER — Other Ambulatory Visit: Payer: Self-pay | Admitting: Family Medicine

## 2015-10-11 ENCOUNTER — Telehealth: Payer: Self-pay | Admitting: Family Medicine

## 2015-10-11 NOTE — Telephone Encounter (Signed)
[name] [name] is calling asking for a refill on her Ibuprofren she states that there is no refills left at the pharmacy, please advise?

## 2015-10-12 ENCOUNTER — Other Ambulatory Visit: Payer: Self-pay

## 2015-10-12 NOTE — Telephone Encounter (Signed)
Wasn't asking for ibuprofen was asking for advair. Already refilled

## 2015-10-14 ENCOUNTER — Encounter (HOSPITAL_COMMUNITY): Payer: Medicare Other

## 2015-10-14 ENCOUNTER — Other Ambulatory Visit (HOSPITAL_COMMUNITY)
Admission: RE | Admit: 2015-10-14 | Discharge: 2015-10-14 | Disposition: A | Discharge status: Home / Self Care | Payer: Medicare Other | Source: Other Acute Inpatient Hospital | Attending: Family Medicine | Admitting: Family Medicine

## 2015-10-14 ENCOUNTER — Other Ambulatory Visit (HOSPITAL_COMMUNITY): Payer: Medicare Other

## 2015-10-14 ENCOUNTER — Encounter (HOSPITAL_COMMUNITY): Payer: Medicare Other | Attending: Internal Medicine

## 2015-10-14 DIAGNOSIS — E1122 Type 2 diabetes mellitus with diabetic chronic kidney disease: Secondary | ICD-10-CM | POA: Diagnosis not present

## 2015-10-14 DIAGNOSIS — E559 Vitamin D deficiency, unspecified: Secondary | ICD-10-CM | POA: Insufficient documentation

## 2015-10-14 DIAGNOSIS — N189 Chronic kidney disease, unspecified: Secondary | ICD-10-CM | POA: Insufficient documentation

## 2015-10-14 DIAGNOSIS — E790 Hyperuricemia without signs of inflammatory arthritis and tophaceous disease: Secondary | ICD-10-CM | POA: Insufficient documentation

## 2015-10-14 DIAGNOSIS — E611 Iron deficiency: Secondary | ICD-10-CM | POA: Diagnosis not present

## 2015-10-14 DIAGNOSIS — E039 Hypothyroidism, unspecified: Secondary | ICD-10-CM | POA: Insufficient documentation

## 2015-10-14 DIAGNOSIS — E785 Hyperlipidemia, unspecified: Secondary | ICD-10-CM | POA: Diagnosis not present

## 2015-10-14 DIAGNOSIS — N184 Chronic kidney disease, stage 4 (severe): Secondary | ICD-10-CM

## 2015-10-14 DIAGNOSIS — D638 Anemia in other chronic diseases classified elsewhere: Secondary | ICD-10-CM | POA: Insufficient documentation

## 2015-10-14 DIAGNOSIS — D649 Anemia, unspecified: Secondary | ICD-10-CM

## 2015-10-14 LAB — TSH: TSH: 1.064 u[IU]/mL (ref 0.350–4.500)

## 2015-10-14 LAB — COMPREHENSIVE METABOLIC PANEL
ALT: 17 U/L (ref 14–54)
AST: 29 U/L (ref 15–41)
Albumin: 4.2 g/dL (ref 3.5–5.0)
Alkaline Phosphatase: 61 U/L (ref 38–126)
Anion gap: 9 (ref 5–15)
BUN: 69 mg/dL — ABNORMAL HIGH (ref 6–20)
CO2: 27 mmol/L (ref 22–32)
Calcium: 9.5 mg/dL (ref 8.9–10.3)
Chloride: 98 mmol/L — ABNORMAL LOW (ref 101–111)
Creatinine, Ser: 1.89 mg/dL — ABNORMAL HIGH (ref 0.44–1.00)
GFR calc Af Amer: 28 mL/min — ABNORMAL LOW (ref >60)
GFR calc non Af Amer: 24 mL/min — ABNORMAL LOW (ref >60)
Glucose, Bld: 111 mg/dL — ABNORMAL HIGH (ref 65–99)
Potassium: 4.5 mmol/L (ref 3.5–5.1)
Sodium: 134 mmol/L — ABNORMAL LOW (ref 135–145)
Total Bilirubin: 0.6 mg/dL (ref 0.3–1.2)
Total Protein: 8.1 g/dL (ref 6.5–8.1)

## 2015-10-14 LAB — HEMOGLOBIN: Hemoglobin: 11.1 g/dL — ABNORMAL LOW (ref 12.0–15.0)

## 2015-10-14 LAB — LIPID PANEL
Cholesterol: 148 mg/dL (ref 0–200)
HDL: 29 mg/dL — ABNORMAL LOW (ref >40)
LDL Cholesterol: 79 mg/dL (ref 0–99)
Total CHOL/HDL Ratio: 5.1 RATIO
Triglycerides: 201 mg/dL — ABNORMAL HIGH (ref <150)
VLDL: 40 mg/dL (ref 0–40)

## 2015-10-14 LAB — URIC ACID: Uric Acid, Serum: 6.8 mg/dL — ABNORMAL HIGH (ref 2.3–6.6)

## 2015-10-14 NOTE — Progress Notes (Signed)
Injection held due to hgb 11.1

## 2015-10-15 LAB — HEMOGLOBIN A1C
Hgb A1c MFr Bld: 6.1 % — ABNORMAL HIGH (ref 4.8–5.6)
Mean Plasma Glucose: 128 mg/dL

## 2015-10-15 LAB — VITAMIN D 25 HYDROXY (VIT D DEFICIENCY, FRACTURES): Vit D, 25-Hydroxy: 41.9 ng/mL (ref 30.0–100.0)

## 2015-10-21 ENCOUNTER — Ambulatory Visit (INDEPENDENT_AMBULATORY_CARE_PROVIDER_SITE_OTHER): Payer: Medicare Other | Admitting: Family Medicine

## 2015-10-21 ENCOUNTER — Ambulatory Visit (HOSPITAL_COMMUNITY): Payer: Medicare Other

## 2015-10-21 ENCOUNTER — Encounter: Payer: Self-pay | Admitting: Family Medicine

## 2015-10-21 VITALS — BP 124/60 | HR 86 | Resp 18 | Ht 67.0 in | Wt 224.0 lb

## 2015-10-21 DIAGNOSIS — E785 Hyperlipidemia, unspecified: Secondary | ICD-10-CM

## 2015-10-21 DIAGNOSIS — J302 Other seasonal allergic rhinitis: Secondary | ICD-10-CM

## 2015-10-21 DIAGNOSIS — E890 Postprocedural hypothyroidism: Secondary | ICD-10-CM

## 2015-10-21 DIAGNOSIS — N184 Chronic kidney disease, stage 4 (severe): Secondary | ICD-10-CM

## 2015-10-21 DIAGNOSIS — I10 Essential (primary) hypertension: Secondary | ICD-10-CM | POA: Diagnosis not present

## 2015-10-21 DIAGNOSIS — J3089 Other allergic rhinitis: Secondary | ICD-10-CM | POA: Diagnosis not present

## 2015-10-21 DIAGNOSIS — I5022 Chronic systolic (congestive) heart failure: Secondary | ICD-10-CM

## 2015-10-21 DIAGNOSIS — E1122 Type 2 diabetes mellitus with diabetic chronic kidney disease: Secondary | ICD-10-CM

## 2015-10-21 MED ORDER — ZAROXOLYN 2.5 MG PO TABS: 2.5000 mg | ORAL_TABLET | Freq: Every day | ORAL | Status: DC

## 2015-10-21 MED ORDER — FLUTICASONE-SALMETEROL 100-50 MCG/DOSE IN AEPB: INHALATION_SPRAY | RESPIRATORY_TRACT | Status: DC

## 2015-10-21 MED ORDER — FUROSEMIDE 40 MG PO TABS: ORAL_TABLET | ORAL | Status: DC

## 2015-10-21 MED ORDER — DIGOXIN 62.5 MCG PO TABS: ORAL_TABLET | ORAL | Status: DC

## 2015-10-21 MED ORDER — POTASSIUM CHLORIDE CRYS ER 20 MEQ PO TBCR: EXTENDED_RELEASE_TABLET | ORAL | Status: DC

## 2015-10-21 MED ORDER — PROMETHAZINE-DM 6.25-15 MG/5ML PO SYRP: ORAL_SOLUTION | ORAL | Status: DC

## 2015-10-21 MED ORDER — BENZONATATE 100 MG PO CAPS
100.0000 mg | ORAL_CAPSULE | Freq: Two times a day (BID) | ORAL | Status: DC | PRN
Start: 2015-10-21 — End: 2015-11-17

## 2015-10-21 MED ORDER — GABAPENTIN 300 MG PO CAPS: 300.0000 mg | ORAL_CAPSULE | Freq: Two times a day (BID) | ORAL | Status: DC

## 2015-10-21 MED ORDER — LOSARTAN POTASSIUM 50 MG PO TABS: ORAL_TABLET | ORAL | Status: DC

## 2015-10-21 MED ORDER — ISOSORBIDE DINITRATE ER 40 MG PO TBCR: EXTENDED_RELEASE_TABLET | ORAL | Status: DC

## 2015-10-21 MED ORDER — TEMAZEPAM 30 MG PO CAPS: ORAL_CAPSULE | ORAL | Status: DC

## 2015-10-21 MED ORDER — SYNTHROID 75 MCG PO TABS: ORAL_TABLET | ORAL | Status: DC

## 2015-10-21 MED ORDER — MONTELUKAST SODIUM 10 MG PO TABS: 10.0000 mg | ORAL_TABLET | Freq: Every day | ORAL | Status: DC

## 2015-10-21 MED ORDER — ALLOPURINOL 100 MG PO TABS: 100.0000 mg | ORAL_TABLET | Freq: Every day | ORAL | Status: DC

## 2015-10-21 MED ORDER — FOLIC ACID 1 MG PO TABS: 1.0000 mg | ORAL_TABLET | Freq: Every day | ORAL | Status: DC

## 2015-10-21 MED ORDER — TOPROL XL 100 MG PO TB24: 100.0000 mg | ORAL_TABLET | Freq: Every day | ORAL | Status: DC

## 2015-10-21 MED ORDER — EZETIMIBE-SIMVASTATIN 10-20 MG PO TABS: ORAL_TABLET | ORAL | Status: DC

## 2015-10-21 NOTE — Progress Notes (Signed)
   Subjective:    Patient ID: [name] [name], female    DOB: 1932-03-19, 82 y.o.   MRN: [id number]  HPI   [name] [name]     MRN: [id number]      DOB: 19-Jul-1932   HPI [name] [name] is here for follow up and re-evaluation of chronic medical conditions, medication management and review of any available recent lab and radiology data.  Preventive health is updated, specifically  Cancer screening and Immunization.   Questions or concerns regarding consultations or procedures which the PT has had in the interim are  addressed. The PT denies any adverse reactions to current medications since the last visit.  C/o increased dry cough and post nasal drainage ROS Denies recent fever or chills. Denies sinus pressure,  ear pain or sore throat. Denies chest congestion, productive cough or wheezing. Denies chest pains, palpitations and leg swelling Denies abdominal pain, nausea, vomiting,diarrhea or constipation.   Denies dysuria, frequency, hesitancy or incontinence. C/o chronic joint pain, swelling and limitation in mobility. Denies headaches, seizures, numbness, or tingling. Denies depression, anxiety or insomnia. Denies skin break down or rash.   PE  BP 124/60 mmHg  Pulse 86  Resp 18  Ht 5\' 7"  (1.702 m)  Wt 224 lb (101.606 kg)  BMI 35.08 kg/m2  SpO2 92%  Patient alert and oriented and in no cardiopulmonary distress.  HEENT: No facial asymmetry, EOMI,   oropharynx pink and moist.  Neck supple no JVD, no mass. No Sinus tenderness, TM clear, erythema and edema of nasal mucosa Chest: Clear to auscultation bilaterally.  CVS: S1, S2 no murmurs, no S3.Regular rate.  ABD: Soft non tender.   Ext: No edema  MS: decreased  ROM spine, shoulders, hips and knees.  Skin: Intact, no ulcerations or rash noted.  Psych: Good eye contact, normal affect. Memory intact not anxious or depressed appearing.  CNS: CN 2-12 intact, power,  normal throughout.no focal deficits noted.   Assessment &  Plan  Essential hypertension Controlled, no change in medication DASH diet and commitment to daily physical activity for a minimum of 30 minutes discussed and encouraged, as a part of hypertension management. The importance of attaining a healthy weight is also discussed.  BP/Weight 10/21/2015 09/02/2015 08/30/2015 08/10/2015 07/22/2015 06/21/2015 A999333  Systolic BP A999333 [id number] 0000000 [id number] 0000000 0000000  Diastolic BP 60 65 72 61 48 60 62  Wt. (Lbs) 224 - 231.5 - - 232 224  BMI 35.08 - 36.25 - - 36.33 35.08        Chronic systolic heart failure Com[pensated and currently stable  Allergic rhinitis Uncontrolled and symptomatic, commitment to daily use of medication is stressed, also cough suppressant and decongestant sent for symptoms  Hypothyroidism, postsurgical Controlled, no change in medication   Type 2 diabetes mellitus Controlled, no change in management   CKD (chronic kidney disease), stage IV Stable , also followed by nephrology  Hyperlipidemia LDL goal <70 Hyperlipidemia:Low fat diet discussed and encouraged.   Lipid Panel  Lab Results  Component Value Date   CHOL 148 10/14/2015   HDL 29* 10/14/2015   LDLCALC 79 10/14/2015   TRIG 201* 10/14/2015   CHOLHDL 5.1 10/14/2015      Uncontrolled , elevated TG, no med change       Review of Systems     Objective:   Physical Exam        Assessment & Plan:

## 2015-10-21 NOTE — Patient Instructions (Addendum)
Annual wellness May 26 or after, call if you need me sooner  Please commit to daily use of allergy tablet, singulair, and both nose sprays, as long as you have tickle and cough  I have sent cough syrup and decongestant pills to your local pharmacy, use ONLY IF NEEDED  Blood work is excellent as far as kidney, liver , blood sugar and thyroid, no changes in medication  Weight is back to weight in September which is great  Cut back on cheese and butter and increase vegetables for improved health  Thanks for choosing [facility], we consider it a privelige to serve you.  All the best for 2017!

## 2015-10-23 NOTE — Assessment & Plan Note (Addendum)
Uncontrolled and symptomatic, commitment to daily use of medication is stressed, also cough suppressant and decongestant sent for symptoms

## 2015-10-23 NOTE — Assessment & Plan Note (Signed)
Stable , also followed by nephrology

## 2015-10-23 NOTE — Assessment & Plan Note (Signed)
Controlled , no change in management 

## 2015-10-23 NOTE — Assessment & Plan Note (Signed)
Controlled, no change in medication  

## 2015-10-23 NOTE — Assessment & Plan Note (Signed)
Com[pensated and currently stable

## 2015-10-23 NOTE — Assessment & Plan Note (Signed)
Hyperlipidemia:Low fat diet discussed and encouraged.   Lipid Panel  Lab Results  Component Value Date   CHOL 148 10/14/2015   HDL 29* 10/14/2015   LDLCALC 79 10/14/2015   TRIG 201* 10/14/2015   CHOLHDL 5.1 10/14/2015      Uncontrolled , elevated TG, no med change

## 2015-10-23 NOTE — Assessment & Plan Note (Signed)
Controlled, no change in medication DASH diet and commitment to daily physical activity for a minimum of 30 minutes discussed and encouraged, as a part of hypertension management. The importance of attaining a healthy weight is also discussed.  BP/Weight 10/21/2015 09/02/2015 08/30/2015 08/10/2015 07/22/2015 06/21/2015 A999333  Systolic BP A999333 [id number] A999333 0000000 [id number] 0000000 0000000  Diastolic BP 60 65 72 61 48 60 62  Wt. (Lbs) 224 - 231.5 - - 232 224  BMI 35.08 - 36.25 - - 36.33 35.08

## 2015-10-25 ENCOUNTER — Other Ambulatory Visit: Payer: Self-pay

## 2015-10-25 DIAGNOSIS — I5022 Chronic systolic (congestive) heart failure: Secondary | ICD-10-CM

## 2015-10-25 MED ORDER — ZAROXOLYN 2.5 MG PO TABS: ORAL_TABLET | ORAL | Status: DC

## 2015-10-26 DIAGNOSIS — M7061 Trochanteric bursitis, right hip: Secondary | ICD-10-CM | POA: Diagnosis not present

## 2015-10-26 DIAGNOSIS — L851 Acquired keratosis [keratoderma] palmaris et plantaris: Secondary | ICD-10-CM | POA: Diagnosis not present

## 2015-10-26 DIAGNOSIS — Z8719 Personal history of other diseases of the digestive system: Secondary | ICD-10-CM | POA: Diagnosis not present

## 2015-10-26 DIAGNOSIS — M25551 Pain in right hip: Secondary | ICD-10-CM | POA: Diagnosis not present

## 2015-10-26 DIAGNOSIS — E119 Type 2 diabetes mellitus without complications: Secondary | ICD-10-CM | POA: Diagnosis not present

## 2015-10-26 DIAGNOSIS — L603 Nail dystrophy: Secondary | ICD-10-CM | POA: Diagnosis not present

## 2015-10-26 DIAGNOSIS — E079 Disorder of thyroid, unspecified: Secondary | ICD-10-CM | POA: Diagnosis not present

## 2015-10-26 DIAGNOSIS — D649 Anemia, unspecified: Secondary | ICD-10-CM | POA: Diagnosis not present

## 2015-10-26 DIAGNOSIS — Z6835 Body mass index (BMI) 35.0-35.9, adult: Secondary | ICD-10-CM | POA: Diagnosis not present

## 2015-10-26 DIAGNOSIS — E1142 Type 2 diabetes mellitus with diabetic polyneuropathy: Secondary | ICD-10-CM | POA: Diagnosis not present

## 2015-10-29 ENCOUNTER — Telehealth: Payer: Self-pay | Admitting: Family Medicine

## 2015-10-29 NOTE — Telephone Encounter (Signed)
Patient is asking if Dr. [name] would Send in  An order for her to have an O2 tank like [place], they are coming to pick the one she currently has up , please advise?

## 2015-11-01 ENCOUNTER — Telehealth: Payer: Self-pay | Admitting: Family Medicine

## 2015-11-01 NOTE — Telephone Encounter (Signed)
Patient is having problems getting her medications refilled, she is asking to speak to the nurse

## 2015-11-01 NOTE — Telephone Encounter (Signed)
Refills have already been sent

## 2015-11-02 ENCOUNTER — Other Ambulatory Visit: Payer: Self-pay | Admitting: Family Medicine

## 2015-11-02 NOTE — Telephone Encounter (Signed)
[employer] is calling in regards to [facility] Reference # [id number] please advise?

## 2015-11-03 ENCOUNTER — Ambulatory Visit (INDEPENDENT_AMBULATORY_CARE_PROVIDER_SITE_OTHER): Payer: Medicare Other | Admitting: *Deleted

## 2015-11-03 DIAGNOSIS — R809 Proteinuria, unspecified: Secondary | ICD-10-CM | POA: Diagnosis not present

## 2015-11-03 DIAGNOSIS — D509 Iron deficiency anemia, unspecified: Secondary | ICD-10-CM | POA: Diagnosis not present

## 2015-11-03 DIAGNOSIS — Z7901 Long term (current) use of anticoagulants: Secondary | ICD-10-CM

## 2015-11-03 DIAGNOSIS — Z5181 Encounter for therapeutic drug level monitoring: Secondary | ICD-10-CM

## 2015-11-03 DIAGNOSIS — N183 Chronic kidney disease, stage 3 (moderate): Secondary | ICD-10-CM | POA: Diagnosis not present

## 2015-11-03 DIAGNOSIS — Z952 Presence of prosthetic heart valve: Secondary | ICD-10-CM

## 2015-11-03 DIAGNOSIS — Z954 Presence of other heart-valve replacement: Secondary | ICD-10-CM | POA: Diagnosis not present

## 2015-11-03 DIAGNOSIS — E559 Vitamin D deficiency, unspecified: Secondary | ICD-10-CM | POA: Diagnosis not present

## 2015-11-03 DIAGNOSIS — I10 Essential (primary) hypertension: Secondary | ICD-10-CM | POA: Diagnosis not present

## 2015-11-03 LAB — POCT INR: INR: 2.9

## 2015-11-03 MED ORDER — SYNTHROID 75 MCG PO TABS: ORAL_TABLET | ORAL | Status: DC

## 2015-11-03 MED ORDER — POTASSIUM CHLORIDE CRYS ER 20 MEQ PO TBCR: EXTENDED_RELEASE_TABLET | ORAL | Status: DC

## 2015-11-03 MED ORDER — LANOXIN 62.5 MCG PO TABS: ORAL_TABLET | ORAL | Status: DC

## 2015-11-03 NOTE — Telephone Encounter (Signed)
[name] [name] needs to speak to a nurse she has let some of her medications run completely out and cant get it from [place] until they get her check which she didn't mail until yesterday, please advise?

## 2015-11-03 NOTE — Telephone Encounter (Signed)
Patient aware and med sent  

## 2015-11-04 ENCOUNTER — Encounter (HOSPITAL_COMMUNITY): Payer: Medicare Other | Attending: Internal Medicine

## 2015-11-04 ENCOUNTER — Encounter (HOSPITAL_COMMUNITY): Payer: Medicare Other

## 2015-11-04 ENCOUNTER — Encounter (HOSPITAL_COMMUNITY): Payer: Self-pay

## 2015-11-04 VITALS — BP 146/54 | HR 86 | Temp 98.4°F | Resp 18

## 2015-11-04 DIAGNOSIS — D638 Anemia in other chronic diseases classified elsewhere: Secondary | ICD-10-CM | POA: Insufficient documentation

## 2015-11-04 DIAGNOSIS — N184 Chronic kidney disease, stage 4 (severe): Secondary | ICD-10-CM

## 2015-11-04 DIAGNOSIS — D631 Anemia in chronic kidney disease: Secondary | ICD-10-CM

## 2015-11-04 DIAGNOSIS — D649 Anemia, unspecified: Secondary | ICD-10-CM

## 2015-11-04 DIAGNOSIS — E611 Iron deficiency: Secondary | ICD-10-CM | POA: Insufficient documentation

## 2015-11-04 LAB — HEMOGLOBIN: Hemoglobin: 10.6 g/dL — ABNORMAL LOW (ref 12.0–15.0)

## 2015-11-04 MED ORDER — EPOETIN ALFA 40000 UNIT/ML IJ SOLN
INTRAMUSCULAR | Status: AC
  Filled 2015-11-04: qty 1

## 2015-11-04 MED ORDER — EPOETIN ALFA 40000 UNIT/ML IJ SOLN
40000.0000 [IU] | Freq: Once | INTRAMUSCULAR | Status: AC
  Administered 2015-11-04: 40000 [IU] via SUBCUTANEOUS

## 2015-11-04 NOTE — Patient Instructions (Signed)
[place] at [facility] Discharge Instructions  RECOMMENDATIONS MADE BY THE CONSULTANT AND ANY TEST RESULTS WILL BE SENT TO YOUR REFERRING PHYSICIAN.  Hemoglobin 10.6. Procrit 40,000 units given as ordered. Return as scheduled.  Thank you for choosing [place] at [facility] to provide your oncology and hematology care.  To afford each patient quality time with our provider, please arrive at least 15 minutes before your scheduled appointment time.   Beginning January 23rd 2017 lab work for the [facility] will be done in the  Main lab at [facility] on 1st floor. If you have a lab appointment with the [facility] please come in thru the  Main Entrance and check in at the main information desk  You need to re-schedule your appointment should you arrive 10 or more minutes late.  We strive to give you quality time with our providers, and arriving late affects you and other patients whose appointments are after yours.  Also, if you no show three or more times for appointments you may be dismissed from the clinic at the providers discretion.     Again, thank you for choosing [facility].  Our hope is that these requests will decrease the amount of time that you wait before being seen by our physicians.       _____________________________________________________________  Should you have questions after your visit to [facility], please contact our office at [phone] between the hours of 8:30 a.m. and 4:30 p.m.  Voicemails left after 4:30 p.m. will not be returned until the following business day.  For prescription refill requests, have your pharmacy contact our office.

## 2015-11-04 NOTE — Progress Notes (Signed)
..  [name] [name] presents today for injection per the provider's orders.  procrit administration without incident; see MAR for injection details.  Patient tolerated procedure well and without incident.  No questions or complaints noted at this time.

## 2015-11-05 ENCOUNTER — Telehealth: Payer: Self-pay | Admitting: *Deleted

## 2015-11-05 NOTE — Telephone Encounter (Signed)
Device check appt made for 2/13 in [place]. Patient voiced understanding.

## 2015-11-05 NOTE — Telephone Encounter (Signed)
LMTCB//sss 

## 2015-11-08 ENCOUNTER — Encounter: Payer: Self-pay | Admitting: Internal Medicine

## 2015-11-08 ENCOUNTER — Ambulatory Visit (INDEPENDENT_AMBULATORY_CARE_PROVIDER_SITE_OTHER): Payer: Medicare Other | Admitting: *Deleted

## 2015-11-08 DIAGNOSIS — I5022 Chronic systolic (congestive) heart failure: Secondary | ICD-10-CM | POA: Diagnosis not present

## 2015-11-08 DIAGNOSIS — I428 Other cardiomyopathies: Secondary | ICD-10-CM

## 2015-11-08 DIAGNOSIS — I429 Cardiomyopathy, unspecified: Secondary | ICD-10-CM

## 2015-11-08 NOTE — Progress Notes (Signed)
CRT-D device check in office. Thresholds and sensing consistent with previous device measurements. Lead impedance trends stable over time. No mode switch episodes recorded. No ventricular arrhythmia episodes recorded. Patient bi-ventricularly pacing 99.8% of the time. Device programmed with appropriate safety margins. Heart failure diagnostics reviewed and trends are stable for patient. RV output changed to 2.5V@0 .76ms, LV output fixed at 3.0V@1 .56ms, Adaptive LV capture management d/c'd. Estimated longevity 1.5 years.  Patient will follow up via [facility] on 5/15 and with [facility] in 02-2016.

## 2015-11-09 ENCOUNTER — Other Ambulatory Visit: Payer: Self-pay

## 2015-11-09 MED ORDER — METOLAZONE 2.5 MG PO TABS: 2.5000 mg | ORAL_TABLET | ORAL | Status: DC

## 2015-11-09 NOTE — Telephone Encounter (Signed)
Patient will hold on this request at this time.

## 2015-11-10 DIAGNOSIS — D638 Anemia in other chronic diseases classified elsewhere: Secondary | ICD-10-CM | POA: Diagnosis not present

## 2015-11-10 DIAGNOSIS — R809 Proteinuria, unspecified: Secondary | ICD-10-CM | POA: Diagnosis not present

## 2015-11-10 DIAGNOSIS — N2581 Secondary hyperparathyroidism of renal origin: Secondary | ICD-10-CM | POA: Diagnosis not present

## 2015-11-10 DIAGNOSIS — N183 Chronic kidney disease, stage 3 (moderate): Secondary | ICD-10-CM | POA: Diagnosis not present

## 2015-11-11 ENCOUNTER — Ambulatory Visit (HOSPITAL_COMMUNITY): Payer: Medicare Other

## 2015-11-16 DIAGNOSIS — H16142 Punctate keratitis, left eye: Secondary | ICD-10-CM | POA: Diagnosis not present

## 2015-11-16 DIAGNOSIS — H16212 Exposure keratoconjunctivitis, left eye: Secondary | ICD-10-CM | POA: Diagnosis not present

## 2015-11-17 ENCOUNTER — Other Ambulatory Visit: Payer: Self-pay

## 2015-11-17 ENCOUNTER — Telehealth: Payer: Self-pay | Admitting: Family Medicine

## 2015-11-17 DIAGNOSIS — J302 Other seasonal allergic rhinitis: Secondary | ICD-10-CM

## 2015-11-17 MED ORDER — BENZONATATE 100 MG PO CAPS: 100.0000 mg | ORAL_CAPSULE | Freq: Two times a day (BID) | ORAL | Status: DC | PRN

## 2015-11-17 MED ORDER — ONDANSETRON HCL 4 MG PO TABS: 4.0000 mg | ORAL_TABLET | Freq: Every day | ORAL | Status: DC

## 2015-11-17 NOTE — Telephone Encounter (Signed)
Patient requesting refills on medication.

## 2015-11-17 NOTE — Telephone Encounter (Signed)
Brandi please return Hatties phone call

## 2015-11-22 ENCOUNTER — Telehealth: Payer: Self-pay | Admitting: Family Medicine

## 2015-11-22 ENCOUNTER — Telehealth: Payer: Self-pay | Admitting: *Deleted

## 2015-11-22 MED ORDER — FIRST-DUKES MOUTHWASH MT SUSP: OROMUCOSAL | Status: DC

## 2015-11-22 NOTE — Telephone Encounter (Signed)
7.5-325 

## 2015-11-22 NOTE — Telephone Encounter (Signed)
Dose?

## 2015-11-22 NOTE — Telephone Encounter (Signed)
Med sent, attempted to let pt know, pt aware

## 2015-11-22 NOTE — Telephone Encounter (Signed)
Patient called requesting hydrocodone qty 120 to be refilled. Please advise

## 2015-11-22 NOTE — Telephone Encounter (Signed)
Patient c/o of sore throat x 2 days.  No fever.  Sputum is clear-white.  Minimal coughing.  Does hurt to swallow.  Has been doing salt water gargles.  Please advise

## 2015-11-22 NOTE — Telephone Encounter (Signed)
Patient is asking if something could be called in for her sorethroat, please advise

## 2015-11-23 ENCOUNTER — Other Ambulatory Visit: Payer: Self-pay

## 2015-11-23 DIAGNOSIS — M4806 Spinal stenosis, lumbar region: Secondary | ICD-10-CM | POA: Diagnosis not present

## 2015-11-23 DIAGNOSIS — M5416 Radiculopathy, lumbar region: Secondary | ICD-10-CM | POA: Diagnosis not present

## 2015-11-23 DIAGNOSIS — M47816 Spondylosis without myelopathy or radiculopathy, lumbar region: Secondary | ICD-10-CM | POA: Diagnosis not present

## 2015-11-23 MED ORDER — ALLOPURINOL 100 MG PO TABS: 100.0000 mg | ORAL_TABLET | Freq: Every day | ORAL | Status: DC

## 2015-11-23 MED ORDER — HYDROCODONE-ACETAMINOPHEN 7.5-325 MG PO TABS: 1.0000 | ORAL_TABLET | ORAL | Status: DC | PRN

## 2015-11-23 NOTE — Telephone Encounter (Signed)
Called patient to notify per our nurse. Left voice message.

## 2015-11-23 NOTE — Telephone Encounter (Signed)
Rx printed

## 2015-11-24 ENCOUNTER — Other Ambulatory Visit: Payer: Self-pay

## 2015-11-24 MED ORDER — FIRST-DUKES MOUTHWASH MT SUSP: OROMUCOSAL | Status: DC

## 2015-11-25 ENCOUNTER — Other Ambulatory Visit: Payer: Self-pay | Admitting: Physical Medicine and Rehabilitation

## 2015-11-25 ENCOUNTER — Other Ambulatory Visit: Payer: Self-pay

## 2015-11-25 ENCOUNTER — Other Ambulatory Visit (HOSPITAL_COMMUNITY): Payer: Medicare Other

## 2015-11-25 ENCOUNTER — Ambulatory Visit (HOSPITAL_COMMUNITY): Payer: Medicare Other | Admitting: Hematology & Oncology

## 2015-11-25 ENCOUNTER — Encounter (HOSPITAL_BASED_OUTPATIENT_CLINIC_OR_DEPARTMENT_OTHER): Payer: Medicare Other | Admitting: Oncology

## 2015-11-25 ENCOUNTER — Encounter (HOSPITAL_COMMUNITY): Payer: Self-pay | Admitting: Oncology

## 2015-11-25 ENCOUNTER — Encounter (HOSPITAL_COMMUNITY): Payer: Medicare Other | Attending: Internal Medicine

## 2015-11-25 ENCOUNTER — Encounter (HOSPITAL_COMMUNITY): Payer: Medicare Other

## 2015-11-25 VITALS — BP 147/53 | HR 78 | Temp 99.0°F | Resp 20 | Wt 225.0 lb

## 2015-11-25 DIAGNOSIS — N189 Chronic kidney disease, unspecified: Secondary | ICD-10-CM | POA: Diagnosis not present

## 2015-11-25 DIAGNOSIS — E611 Iron deficiency: Secondary | ICD-10-CM | POA: Insufficient documentation

## 2015-11-25 DIAGNOSIS — D631 Anemia in chronic kidney disease: Secondary | ICD-10-CM

## 2015-11-25 DIAGNOSIS — M5416 Radiculopathy, lumbar region: Secondary | ICD-10-CM

## 2015-11-25 DIAGNOSIS — D638 Anemia in other chronic diseases classified elsewhere: Secondary | ICD-10-CM | POA: Insufficient documentation

## 2015-11-25 DIAGNOSIS — N184 Chronic kidney disease, stage 4 (severe): Secondary | ICD-10-CM

## 2015-11-25 DIAGNOSIS — D649 Anemia, unspecified: Secondary | ICD-10-CM

## 2015-11-25 LAB — CBC
HCT: 32.7 % — ABNORMAL LOW (ref 36.0–46.0)
Hemoglobin: 10.6 g/dL — ABNORMAL LOW (ref 12.0–15.0)
MCH: 30.9 pg (ref 26.0–34.0)
MCHC: 32.4 g/dL (ref 30.0–36.0)
MCV: 95.3 fL (ref 78.0–100.0)
Platelets: 216 10*3/uL (ref 150–400)
RBC: 3.43 MIL/uL — ABNORMAL LOW (ref 3.87–5.11)
RDW: 15.7 % — ABNORMAL HIGH (ref 11.5–15.5)
WBC: 7.4 10*3/uL (ref 4.0–10.5)

## 2015-11-25 MED ORDER — EPOETIN ALFA 40000 UNIT/ML IJ SOLN
40000.0000 [IU] | Freq: Once | INTRAMUSCULAR | Status: AC
  Administered 2015-11-25: 40000 [IU] via SUBCUTANEOUS
  Filled 2015-11-25: qty 1

## 2015-11-25 NOTE — Progress Notes (Signed)
[name] Nakayama, MD [name] [place] [address]  Anemia in chronic renal disease - Plan: CBC, CBC, CBC, CANCELED: Ferritin, CANCELED: Ferritin  CURRENT THERAPY: Procrit 40,000 units every 3-4 weeks  INTERVAL HISTORY: [name] [name] 82 y.o. 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  I personally reviewed and went over laboratory results with the patient.  The results are noted within this dictation. Her hemoglobin today is 10.6 g/dL which is very stable.  She satisfies parameters for Procrit today.  She denies any complaints. She denies any obvious blood loss. She denies any blood in her stool, black tarry stool, hematuria, hemoptysis, vaginal bleeding, gingival bleeding.  We discussed her previous employment with the [employer] in [place].   Past Medical History  Diagnosis Date  . DJD (degenerative joint disease) of lumbar spine   . Esophageal reflux   . Other and unspecified hyperlipidemia   . Non-ischemic cardiomyopathy ([place])     a. s/p MDT CRTD  . Obesity, unspecified   . 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 IDA (iron deficiency anemia)     Parenteral iron/Dr. Tressie [name]  . Adenomatous polyp of colon 12/16/2002    Dr. Collier Salina [name]  . Hyperlipemia   . COPD (chronic obstructive pulmonary disease) ([place])        . CHF (congestive heart failure) ([place])        . Constipation        . Gout        . CHB (complete heart block) ([place]) 10/07/2014       . Insomnia        . Unspecified hypothyroidism        . Vertigo        . DM (diabetes mellitus) ([place]) 2006  . Chronic kidney disease, stage I 2006  . Thyroid cancer ([place])    remote thyroidectomy, no recurrence, pt denies in 2016 thyroid cancer  . Oxygen deficiency 2014    nocturnal;  . Heart murmur   . Allergy     has Hypothyroidism, postsurgical; Type 2 diabetes mellitus ([place]); Hyperlipidemia LDL goal <70; Morbid obesity ([place]); Essential hypertension; Non-ischemic cardiomyopathy with ICD; VARICOSE VEINS LOWER EXTREMITIES W/OTH COMPS; GERD; CHOLELITHIASIS; HIP PAIN, RIGHT; DEGENERATIVE JOINT DISEASE, SPINE; Nausea without vomiting; Allergic rhinitis; Iron deficiency; Papilloma of breast; Warfarin-induced coagulopathy ([place]); Cardiorenal syndrome with renal failure; S/P AVR (aortic valve replacement); S/P MVR (mitral valve replacement); AICD (automatic cardioverter/defibrillator) present; CKD (chronic kidney disease), stage IV ([place]); Chronic anticoagulation, on coumadin for Mechanical AVR and MVR; COPD (chronic obstructive pulmonary disease) ([place]); Chronic systolic heart failure ([place]); H/O adenomatous polyp of colon; High risk medication use; Hemolysis; Spinal stenosis of lumbar region; OA (osteoarthritis) of knee; Rotator cuff syndrome of left shoulder; Encounter for therapeutic drug monitoring; Osteopenia; Anemia; Fatigue; CHB (complete heart block) ([place]); Back pain without radiation; and S/P ICD (internal cardiac defibrillator) procedure on her problem list.     is allergic to aspirin; fentanyl; niacin; and penicillins.  Current Outpatient Prescriptions on File Prior to Visit  Medication Sig Dispense Refill  . acetaminophen (TYLENOL) 500 MG tablet Take 500 mg by mouth every 6 (six) hours as needed (pain).    Marland Kitchen  albuterol (PROVENTIL HFA;VENTOLIN HFA) 108 (90 BASE) MCG/ACT inhaler Inhale 2 puffs into the lungs every 6 (six) hours as needed for wheezing or shortness of breath. 1 Inhaler 3  . albuterol (PROVENTIL) (2.5 MG/3ML) 0.083% nebulizer solution INHALE 1 VIAL VIA NEBULIZER THREE TIMES DAILY. 300 mL 0  . allopurinol (ZYLOPRIM) 100 MG tablet Take 1 tablet (100 mg total)  by mouth daily. 90 tablet 1  . azelastine (ASTELIN) 0.1 % nasal spray Place 2 sprays into both nostrils 2 (two) times daily. Use in each nostril as directed 30 mL 12  . benzonatate (TESSALON) 100 MG capsule Take 1 capsule (100 mg total) by mouth 2 (two) times daily as needed for cough. 20 capsule 2  . cholecalciferol (VITAMIN D) 1000 UNITS tablet Take 1,000 Units by mouth daily.    . clindamycin (CLEOCIN) 300 MG capsule Take 2 capsules (600 mg) one hour prior to dental appointment 6 capsule 0  . Diphenhyd-Hydrocort-Nystatin (FIRST-DUKES MOUTHWASH) SUSP 2 tsp three times daily, gargle, swish and swallow for 3 days, then as needed, up to 1 week total 237 mL 0  . docusate sodium (COLACE) 100 MG capsule Take 300 mg by mouth at bedtime.    Marland Kitchen epoetin alfa (EPOGEN,PROCRIT) 96295 UNIT/ML injection Inject 40,000 Units into the skin once.    . ezetimibe-simvastatin (VYTORIN) 10-20 MG tablet Take 1 tablet by mouth 3  times weekly 39 tablet 1  . fluticasone (FLONASE) 50 MCG/ACT nasal spray instill 2 sprays into each nostril once daily (Patient taking differently: instill 2 sprays into each nostril once daily as needed for congestion) 16 g 2  . Fluticasone-Salmeterol (ADVAIR DISKUS) 100-50 MCG/DOSE AEPB inhale 1 dose by mouth twice a day 99991111 each 1  . folic acid (FOLVITE) 1 MG tablet Take 1 tablet (1 mg total) by mouth daily. 100 tablet 1  . furosemide (LASIX) 40 MG tablet Take 1 tablet by mouth  daily 90 tablet 1  . gabapentin (NEURONTIN) 300 MG capsule Take 1 capsule (300 mg total) by mouth 2 (two) times daily. 180 capsule 1  . hydrALAZINE (APRESOLINE) 25 MG tablet take 1 tablet by mouth three times a day 270 tablet 3  . HYDROcodone-acetaminophen (NORCO) 7.5-325 MG tablet Take 1 tablet by mouth every 4 (four) hours as needed for moderate pain (Must last 30 days.  Do not drive or operate machinery while taking this medicine.). 120 tablet 0  . isosorbide dinitrate (ISOCHRON) 40 MG CR tablet Take one-half tablet  by  mouth twice a day 90 tablet 1  . LANOXIN 62.5 MCG TABS Take 1 tablet by mouth  every day 30 tablet 0  . losartan (COZAAR) 50 MG tablet Take 1 tablet by mouth  daily 90 tablet 1  . meclizine (ANTIVERT) 12.5 MG tablet Take 1 tablet (12.5 mg total) by mouth 2 (two) times daily as needed for dizziness. 30 tablet 2  . metolazone (ZAROXOLYN) 2.5 MG tablet Take 1 tablet (2.5 mg total) by mouth once a week. 2 tablet 0  . montelukast (SINGULAIR) 10 MG tablet Take 1 tablet (10 mg total) by mouth at bedtime. 90 tablet 3  . ondansetron (ZOFRAN) 4 MG tablet Take 1 tablet (4 mg total) by mouth daily. 90 tablet 0  . Polyethyl [name] (SYSTANE OP) Place 1 drop into both eyes daily as needed (dry eyes).    . polyethylene glycol powder (MIRALAX) powder Take 17 g by mouth daily as needed for mild constipation.     . potassium chloride SA (  KLOR-CON M20) 20 MEQ tablet Take 1 tablet by mouth  daily 30 tablet 0  . PROAIR HFA 108 (90 BASE) MCG/ACT inhaler USE 2 PUFFS INTO THE LUNGS EVERY 6 HOURS AS NEEDED FOR WHEEZING OR SHORTNESS OF BREATH. 8.5 Inhaler 3  . promethazine-dextromethorphan (PROMETHAZINE-DM) 6.25-15 MG/5ML syrup One teaspoon at bedtime as needed, for cough 180 mL 0  . SYNTHROID 75 MCG tablet Take 1 tablet by mouth  daily 30 tablet 0  . temazepam (RESTORIL) 30 MG capsule take 1 capsule at bedtime if needed 90 capsule 0  . TOPROL XL 100 MG 24 hr tablet Take 1 tablet (100 mg total) by mouth daily. Take with or immediately following a meal. 90 tablet 1  . Vitamin Mixture (ESTER-C PO) Take 1 tablet by mouth daily as needed (for immune system boost).    . warfarin (COUMADIN) 5 MG tablet Take 2 tablets daily or as directed by coumadin clinic 10 tablet 1  . ZAROXOLYN 2.5 MG tablet One tablet once weekly 12 tablet 3   Current Facility-Administered Medications on File Prior to Visit  Medication Dose Route Frequency Provider Last Rate Last Dose  . epoetin alfa (EPOGEN,PROCRIT) injection 40,000 Units   40,000 Units Subcutaneous Once Farrel Gobble, MD        Past Surgical History  Procedure Laterality Date  . Thyroidectomy    . Pacemaker placed  2004  . Aortic and mitral valve replacement  2001  . Right breast cyst removed      benign   . Right cataract removed      2005  . Defibrillator implanted 2006    . Cardiac valve replacement    . Cardiac catheterization    . Insert / replace / remove pacemaker    . Esophagogastroduodenoscopy  06/12/2007    Rourk- normal esophagus, small hiatal hernia, otherwise normal stomach, D1, D2  . Colonoscopy  06/12/2007    Rourk- Normal rectum/Normal colon  . Colonoscopy  12/16/02    small hemorrhoids  . Colonoscopy  06/20/2012    Procedure: COLONOSCOPY;  Surgeon: Daneil Dolin, MD;  Location: [facility];  Service: Endoscopy;  Laterality: N/A;  10:45  . Doppler echocardiography  2012  . Doppler echocardiography  05,06,07,08,09,2011  . Bi-ventricular implantable cardioverter defibrillator upgrade N/A 01/18/2015    Procedure: BI-VENTRICULAR IMPLANTABLE CARDIOVERTER DEFIBRILLATOR UPGRADE;  Surgeon: Evans Lance, MD;  Location: [facility] CATH LAB;  Service: Cardiovascular;  Laterality: N/A;  . Icd lead removal Left 02/08/2015    Procedure: ICD LEAD REMOVAL;  Surgeon: Evans Lance, MD;  [facility];  Service: Cardiovascular;  Laterality: Left;  "Will plan extraction and insertion of a BiV PM"  **Dr. Roxan Hockey backing up case**  . Implantable cardioverter defibrillator (icd) generator change Left 02/08/2015    Procedure: ICD GENERATOR CHANGE;  Surgeon: Evans Lance, MD;  Location: [place];  Service: Cardiovascular;  Laterality: Left;  Marland Kitchen Eye surgery Right 2005  . Eye surgery Left 2006  . Pacemaker insertion  May 2016    Denies any headaches, dizziness, double vision, fevers, chills, night sweats, nausea, vomiting, diarrhea, constipation, chest pain, heart palpitations, shortness of breath, blood in stool, black tarry stool, urinary pain, urinary  burning, urinary frequency, hematuria.   PHYSICAL EXAMINATION  ECOG PERFORMANCE STATUS: 1 - Symptomatic but completely ambulatory  Filed Vitals:   11/25/15 1442  BP: 147/53  Pulse: 78  Temp: 99 F (37.2 C)  Resp: 20    GENERAL:alert, no distress, well nourished, well developed,  comfortable, cooperative, obese, smiling and unaccompanied SKIN: skin color, texture, turgor are normal, no rashes or significant lesions HEAD: Normocephalic, No masses, lesions, tenderness or abnormalities EYES: normal, EOMI, Conjunctiva are pink and non-injected EARS: External ears normal OROPHARYNX:lips, buccal mucosa, and tongue normal and mucous membranes are moist  NECK: supple, trachea midline LYMPH:  not examined BREAST:not examined LUNGS: clear to auscultation  HEART: regular rate & rhythm ABDOMEN:abdomen soft, non-tender, obese and normal bowel sounds BACK: Back symmetric, no curvature., No CVA tenderness EXTREMITIES:less then 2 second capillary refill, no joint deformities, effusion, or inflammation, no skin discoloration, no cyanosis  NEURO: alert & oriented x 3 with fluent speech, no focal motor/sensory deficits   LABORATORY DATA: CBC    Component Value Date/Time   WBC 7.4 11/25/2015 1354   RBC 3.43* 11/25/2015 1354   RBC 3.37* 01/30/2014 1255   HGB 10.6* 11/25/2015 1354   HCT 32.7* 11/25/2015 1354   PLT 216 11/25/2015 1354   MCV 95.3 11/25/2015 1354   MCH 30.9 11/25/2015 1354   MCHC 32.4 11/25/2015 1354   RDW 15.7* 11/25/2015 1354   LYMPHSABS 1.3 01/15/2015 1131   MONOABS 0.4 01/15/2015 1131   EOSABS 0.0 01/15/2015 1131   BASOSABS 0.0 01/15/2015 1131      Chemistry      Component Value Date/Time   NA 134* 10/14/2015 1130   K 4.5 10/14/2015 1130   CL 98* 10/14/2015 1130   CO2 27 10/14/2015 1130   BUN 69* 10/14/2015 1130   CREATININE 1.89* 10/14/2015 1130   CREATININE 2.34* 02/15/2015 1253      Component Value Date/Time   CALCIUM 9.5 10/14/2015 1130   CALCIUM 9.5  06/12/2010 0459   ALKPHOS 61 10/14/2015 1130   AST 29 10/14/2015 1130   ALT 17 10/14/2015 1130   BILITOT 0.6 10/14/2015 1130        PENDING LABS:   RADIOGRAPHIC STUDIES:  No results found.   PATHOLOGY:    ASSESSMENT AND PLAN:  Anemia Chronic anemia felt to be secondary to low-grade hemolysis secondary to mechanical mitral and aortic valves replaced in 2001 and 2004 in addition to an element of anemia of chronic renal disease, Stage IV, and possible element of iron deficiency anemia.  Ferritin has been in the 1000's and therefore there is no indication for any iron replacement at this time.  Labs every 3 weeks: CBC  Procrit 40,000 units every 3 weeks for a HGB < 11 g/dL.  Ferritin in ~3 weeks and then every 90 days.  Return in 6 months for follow-up.   THERAPY PLAN:  Continue with ESA therapy for chronic renal disease with follow-up with nephrologist and primary care provider as directed.  All questions were answered. The patient knows to call the clinic with any problems, questions or concerns. We can certainly see the patient much sooner if necessary.  Patient and plan discussed with Dr. Ancil Linsey and she is in agreement with the aforementioned.   This note is electronically signed by: Doy Mince 11/25/2015 6:09 PM

## 2015-11-25 NOTE — Patient Instructions (Addendum)
[place] at [facility] Discharge Instructions  RECOMMENDATIONS MADE BY THE CONSULTANT AND ANY TEST RESULTS WILL BE SENT TO YOUR REFERRING PHYSICIAN.  Exam and discussion today with Kirby Crigler, PA. Procrit today (hemoglobin 10.6). Lab work and possible procrit injection in 3 weeks.  Ferritin level every 90 days. Return in 6 months for office visit.    Thank you for choosing [place] at [facility] to provide your oncology and hematology care.  To afford each patient quality time with our provider, please arrive at least 15 minutes before your scheduled appointment time.   Beginning January 23rd 2017 lab work for the [facility] will be done in the  Main lab at [facility] on 1st floor. If you have a lab appointment with the [facility] please come in thru the  Main Entrance and check in at the main information desk  You need to re-schedule your appointment should you arrive 10 or more minutes late.  We strive to give you quality time with our providers, and arriving late affects you and other patients whose appointments are after yours.  Also, if you no show three or more times for appointments you may be dismissed from the clinic at the providers discretion.     Again, thank you for choosing [facility].  Our hope is that these requests will decrease the amount of time that you wait before being seen by our physicians.       _____________________________________________________________  Should you have questions after your visit to [facility], please contact our office at [phone] between the hours of 8:30 a.m. and 4:30 p.m.  Voicemails left after 4:30 p.m. will not be returned until the following business day.  For prescription refill requests, have your pharmacy contact our office.         Resources For Cancer Patients and their Caregivers ? American Cancer Society: Can assist with  transportation, wigs, general needs, runs Look Good Feel Better.        854-762-8038 ? Cancer Care: Provides financial assistance, online support groups, medication/co-pay assistance.  1-800-813-HOPE (805) 589-7695) ? [place] Assists [place] cancer patients and their families through emotional , educational and financial support.  (651)571-2248 ? [place] DSS Where to apply for food stamps, Medicaid and utility assistance. 657-061-7895 ? RCATS: Transportation to medical appointments. ([phone] ? Social Security Administration: May apply for disability if have a Stage IV cancer. [phone] 4702737205 ? [employer], Disability and Transit Services: Assists with nutrition, care and transit needs. [phone]

## 2015-11-25 NOTE — Progress Notes (Signed)
1515:  [name] [name] presents today for injection per the provider's orders.  Procrit administration without incident; see MAR for injection details.  Patient tolerated procedure well and without incident.  No questions or complaints noted at this time.

## 2015-11-25 NOTE — Assessment & Plan Note (Addendum)
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  Ferritin has been in the 1000's and therefore there is no indication for any iron replacement at this time.  Labs every 3 weeks: CBC  Procrit 40,000 units every 3 weeks for a HGB < 11 g/dL.  Ferritin in ~3 weeks and then every 90 days.  Return in 6 months for follow-up.

## 2015-11-30 ENCOUNTER — Other Ambulatory Visit: Payer: Self-pay

## 2015-11-30 ENCOUNTER — Telehealth: Payer: Self-pay | Admitting: *Deleted

## 2015-11-30 ENCOUNTER — Other Ambulatory Visit: Payer: Self-pay | Admitting: *Deleted

## 2015-11-30 DIAGNOSIS — I5022 Chronic systolic (congestive) heart failure: Secondary | ICD-10-CM

## 2015-11-30 MED ORDER — WARFARIN SODIUM 5 MG PO TABS: ORAL_TABLET | ORAL | Status: DC

## 2015-11-30 MED ORDER — LANOXIN 62.5 MCG PO TABS: ORAL_TABLET | ORAL | Status: DC

## 2015-11-30 NOTE — Telephone Encounter (Signed)
May not D/C Coumadin for surgical procedure - high risk of thromboembolic complications with mechanical mitral and aortic valve prothesis order faxed 11/25/15.

## 2015-11-30 NOTE — Telephone Encounter (Signed)
Spoke with pt and she wants enough tablets of coumadin called in to [facility] until can get refill from [employer] Pt informed will call in 20 tablets to [facility] and will send in 90 day supply to [employer] and pt states understanding Spoke with Midwife at [facility] in [place] and ordered 20 tablets of coumadin 5mg   as pt wanted brand name and needed to have local pharmacy  refill of coumadin until can get refill from [employer]

## 2015-11-30 NOTE — Telephone Encounter (Signed)
Pt is out of her Coumadin and is wondering if some can be sent to the [facility] in [place], she uses the [facility] order pharmacy and they're taking longer to get to her

## 2015-12-02 ENCOUNTER — Ambulatory Visit (HOSPITAL_COMMUNITY): Payer: Medicare Other

## 2015-12-02 ENCOUNTER — Other Ambulatory Visit: Payer: Self-pay

## 2015-12-02 MED ORDER — ALBUTEROL SULFATE (2.5 MG/3ML) 0.083% IN NEBU: INHALATION_SOLUTION | RESPIRATORY_TRACT | Status: DC

## 2015-12-03 ENCOUNTER — Inpatient Hospital Stay: Admission: RE | Admit: 2015-12-03 | Payer: Medicare Other | Source: Ambulatory Visit

## 2015-12-03 ENCOUNTER — Ambulatory Visit
Admission: RE | Admit: 2015-12-03 | Discharge: 2015-12-03 | Disposition: A | Discharge status: Home / Self Care | Payer: Medicare Other | Source: Ambulatory Visit | Attending: Physical Medicine and Rehabilitation | Admitting: Physical Medicine and Rehabilitation

## 2015-12-03 DIAGNOSIS — M5416 Radiculopathy, lumbar region: Secondary | ICD-10-CM

## 2015-12-03 DIAGNOSIS — M4806 Spinal stenosis, lumbar region: Secondary | ICD-10-CM | POA: Diagnosis not present

## 2015-12-07 ENCOUNTER — Telehealth: Payer: Self-pay | Admitting: Family Medicine

## 2015-12-07 ENCOUNTER — Other Ambulatory Visit: Payer: Self-pay

## 2015-12-07 MED ORDER — LOSARTAN POTASSIUM 50 MG PO TABS: ORAL_TABLET | ORAL | Status: DC

## 2015-12-07 NOTE — Telephone Encounter (Signed)
Patient is asking for a refill onlosartan (COZAAR) 50 MG tablet  Sent to [facility]

## 2015-12-07 NOTE — Telephone Encounter (Signed)
Medication refilled

## 2015-12-08 ENCOUNTER — Other Ambulatory Visit: Payer: Self-pay

## 2015-12-08 MED ORDER — LANOXIN 62.5 MCG PO TABS: ORAL_TABLET | ORAL | Status: DC

## 2015-12-09 DIAGNOSIS — J449 Chronic obstructive pulmonary disease, unspecified: Secondary | ICD-10-CM | POA: Diagnosis not present

## 2015-12-10 ENCOUNTER — Other Ambulatory Visit: Payer: Self-pay | Admitting: *Deleted

## 2015-12-10 MED ORDER — WARFARIN SODIUM 5 MG PO TABS: ORAL_TABLET | ORAL | Status: DC

## 2015-12-10 NOTE — Telephone Encounter (Signed)
Pt is having trouble getting her coumadin through the [facility] --she has yet to receive her coumadin and is about to run out again, she would like her coumadin to be sent to the [facility] in [place] for a 90 day supply, pt stated that her ins would cover it

## 2015-12-10 NOTE — Addendum Note (Signed)
Addended by: Barbarann Ehlers A on: 12/10/2015 11:47 AM   Modules accepted: Orders

## 2015-12-10 NOTE — Telephone Encounter (Signed)
[facility] at [facility] request

## 2015-12-13 ENCOUNTER — Telehealth: Payer: Self-pay | Admitting: Family Medicine

## 2015-12-13 MED ORDER — LOSARTAN POTASSIUM 50 MG PO TABS: ORAL_TABLET | ORAL | Status: DC

## 2015-12-13 MED ORDER — LANOXIN 62.5 MCG PO TABS: ORAL_TABLET | ORAL | Status: DC

## 2015-12-13 NOTE — Telephone Encounter (Signed)
Patient is having problems getting 2 of her medications because she is totally out starting to use [employer] [facility], please advise?

## 2015-12-13 NOTE — Telephone Encounter (Signed)
Refilled both meds [facility] and mailed a letter to [employer] to cancel all her refills and that she was going to start using local pharmacy only

## 2015-12-15 ENCOUNTER — Ambulatory Visit (INDEPENDENT_AMBULATORY_CARE_PROVIDER_SITE_OTHER): Payer: Medicare Other | Admitting: *Deleted

## 2015-12-15 ENCOUNTER — Telehealth: Payer: Self-pay | Admitting: Family Medicine

## 2015-12-15 DIAGNOSIS — Z954 Presence of other heart-valve replacement: Secondary | ICD-10-CM

## 2015-12-15 DIAGNOSIS — Z5181 Encounter for therapeutic drug level monitoring: Secondary | ICD-10-CM | POA: Diagnosis not present

## 2015-12-15 DIAGNOSIS — Z7901 Long term (current) use of anticoagulants: Secondary | ICD-10-CM

## 2015-12-15 DIAGNOSIS — Z952 Presence of prosthetic heart valve: Secondary | ICD-10-CM

## 2015-12-15 LAB — POCT INR: INR: 2.5

## 2015-12-15 MED ORDER — COUMADIN 5 MG PO TABS: ORAL_TABLET | ORAL | Status: DC

## 2015-12-15 MED ORDER — WARFARIN SODIUM 5 MG PO TABS: ORAL_TABLET | ORAL | Status: DC

## 2015-12-15 MED ORDER — LANOXIN 62.5 MCG PO TABS: ORAL_TABLET | ORAL | Status: DC

## 2015-12-15 NOTE — Telephone Encounter (Signed)
needs to talk to someone about medicine. they are too expensive

## 2015-12-16 ENCOUNTER — Encounter (HOSPITAL_COMMUNITY): Payer: Medicare Other

## 2015-12-16 DIAGNOSIS — E611 Iron deficiency: Secondary | ICD-10-CM | POA: Diagnosis not present

## 2015-12-16 DIAGNOSIS — D631 Anemia in chronic kidney disease: Secondary | ICD-10-CM

## 2015-12-16 DIAGNOSIS — N189 Chronic kidney disease, unspecified: Principal | ICD-10-CM

## 2015-12-16 DIAGNOSIS — D638 Anemia in other chronic diseases classified elsewhere: Secondary | ICD-10-CM | POA: Diagnosis not present

## 2015-12-16 LAB — CBC
HCT: 35.2 % — ABNORMAL LOW (ref 36.0–46.0)
Hemoglobin: 11.3 g/dL — ABNORMAL LOW (ref 12.0–15.0)
MCH: 30.9 pg (ref 26.0–34.0)
MCHC: 32.1 g/dL (ref 30.0–36.0)
MCV: 96.2 fL (ref 78.0–100.0)
Platelets: 191 10*3/uL (ref 150–400)
RBC: 3.66 MIL/uL — ABNORMAL LOW (ref 3.87–5.11)
RDW: 15.8 % — ABNORMAL HIGH (ref 11.5–15.5)
WBC: 6.4 10*3/uL (ref 4.0–10.5)

## 2015-12-16 NOTE — Progress Notes (Signed)
Hemoglobin 11.3. Procrit not given, treatment parameters not met.

## 2015-12-17 IMAGING — CR DG CHEST 1V PORT
1 series · 1 of 1 positions shown · non-contrast
Comparison: 04/05/2014

CLINICAL DATA: Pacemaker revision, central line placement

EXAM:
PORTABLE CHEST - 1 VIEW

[AP]
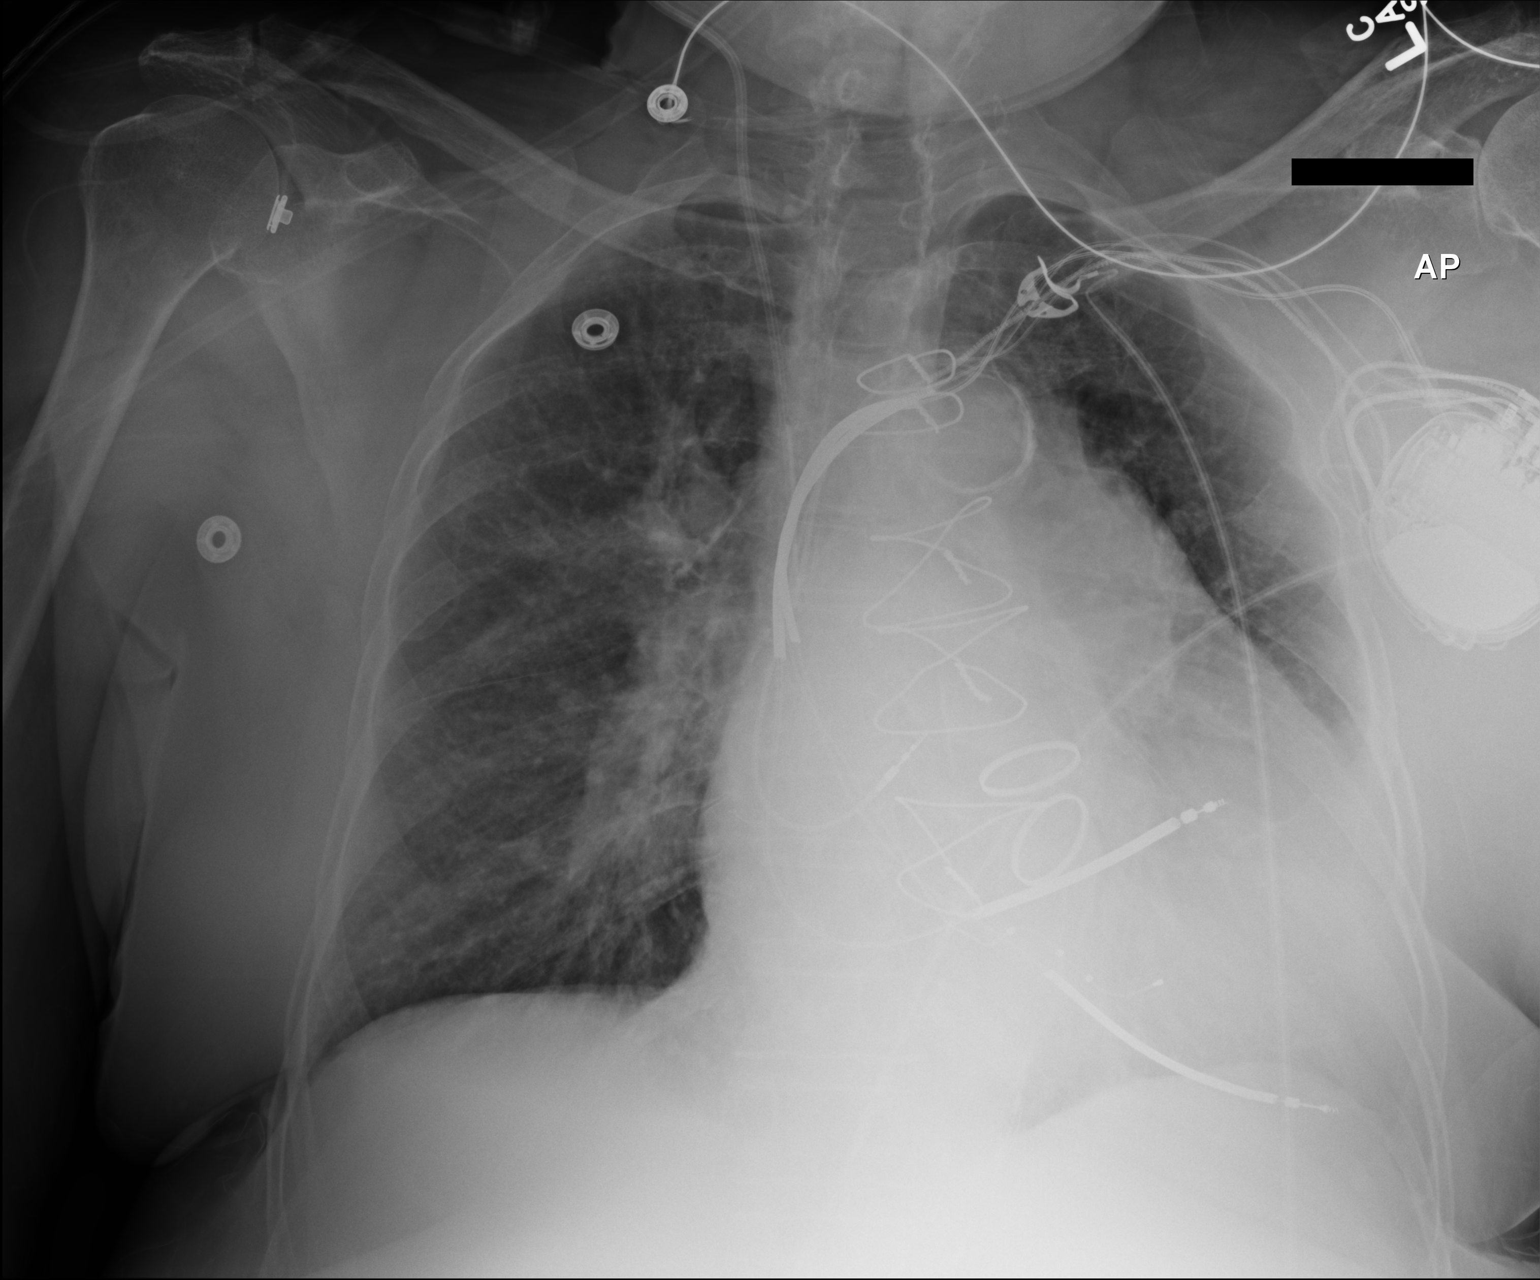

[1 of 1 positions shown; findings below may reference images not displayed]

FINDINGS: There is stable cardiomegaly. There is a 3 lead AICD. There is a
right jugular central venous catheter with the tip projecting over
the SVC. There is no pleural effusion, pneumothorax or focal
consolidation. There is central vascular prominence. The osseous
structures are unremarkable.
IMPRESSION: Stable cardiomegaly.  No CHF.

## 2015-12-18 IMAGING — CR DG CHEST 2V
2 series · 2 of 2 positions shown · non-contrast
Comparison: Chest x-ray of February 08, 2015

CLINICAL DATA: Arm discomfort status post pacemaker placement,
weakness.

EXAM:
CHEST  2 VIEW

[chest lat]
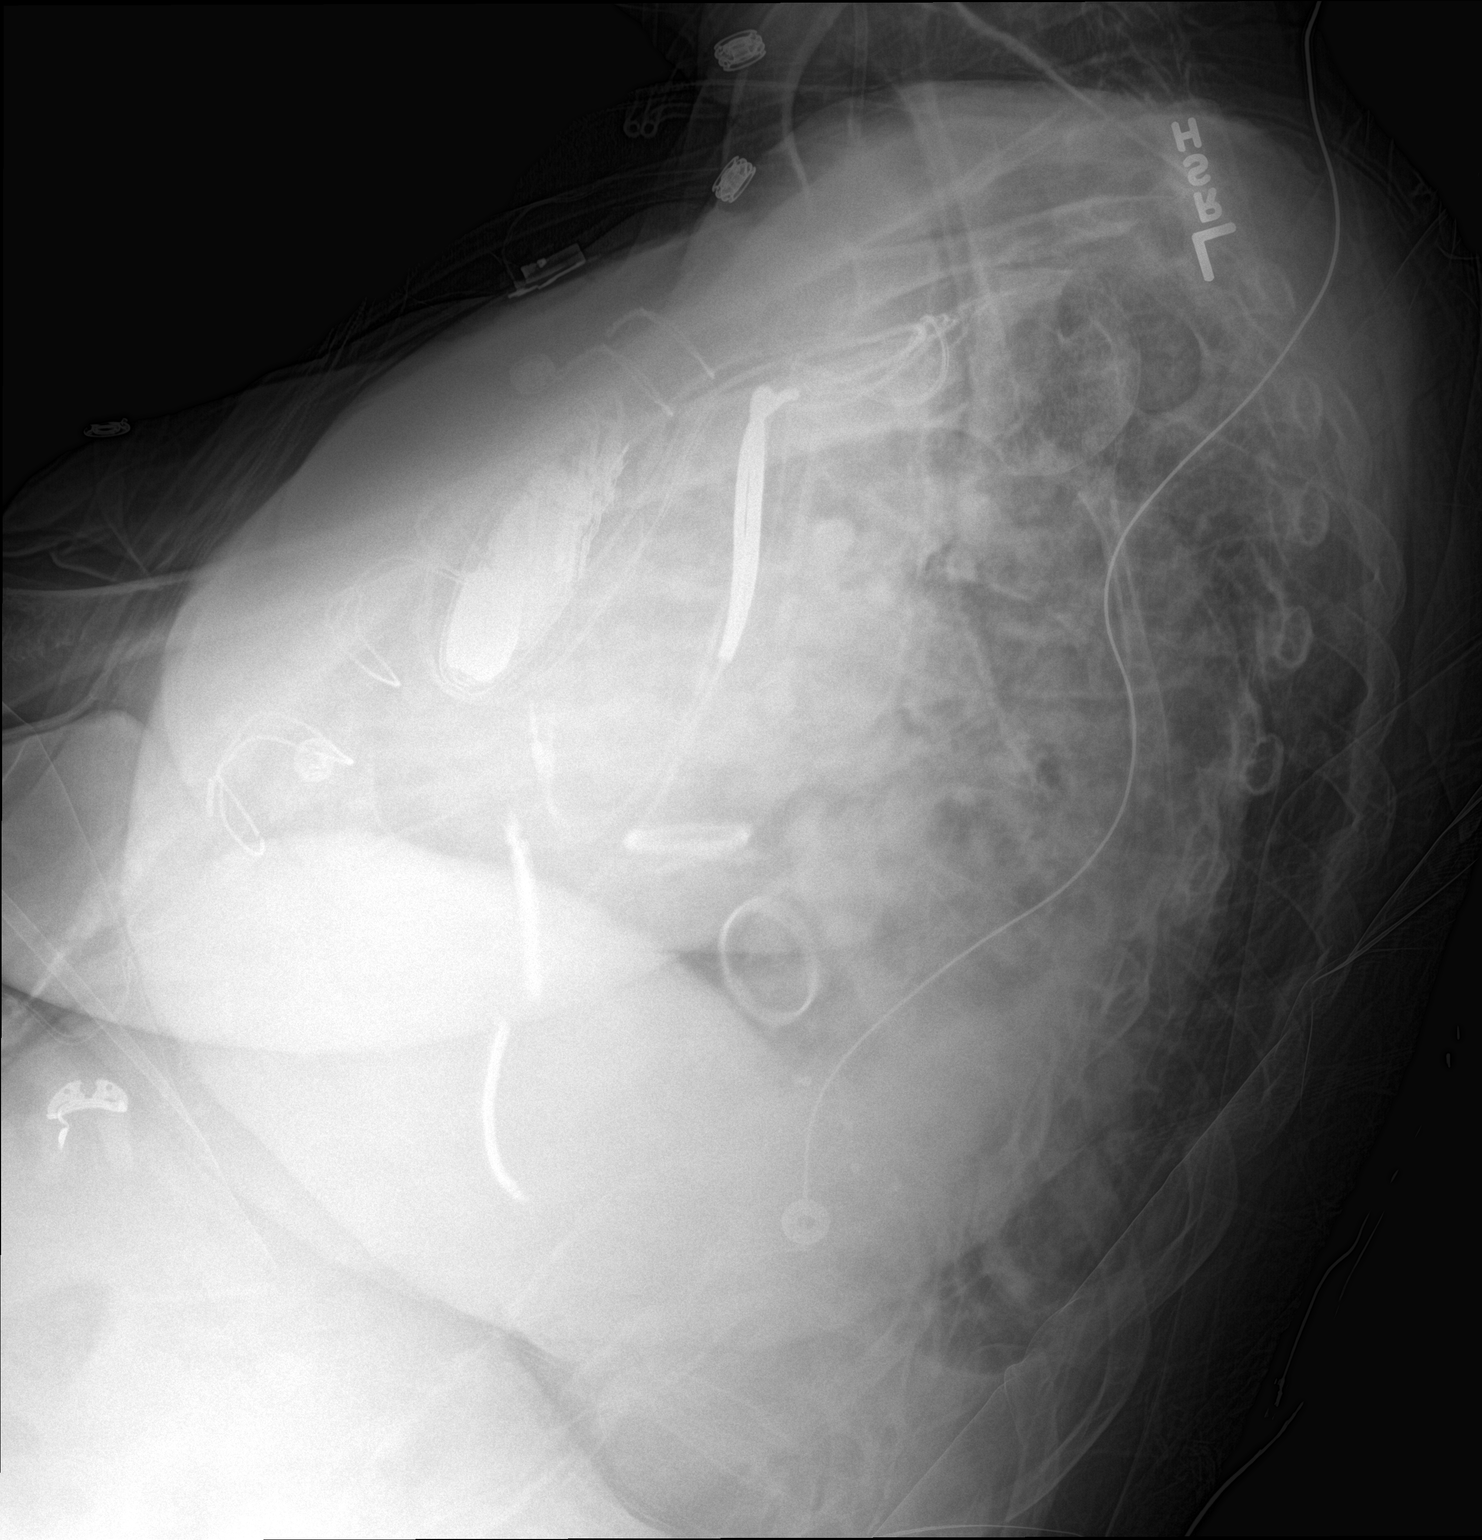

[chest ap]
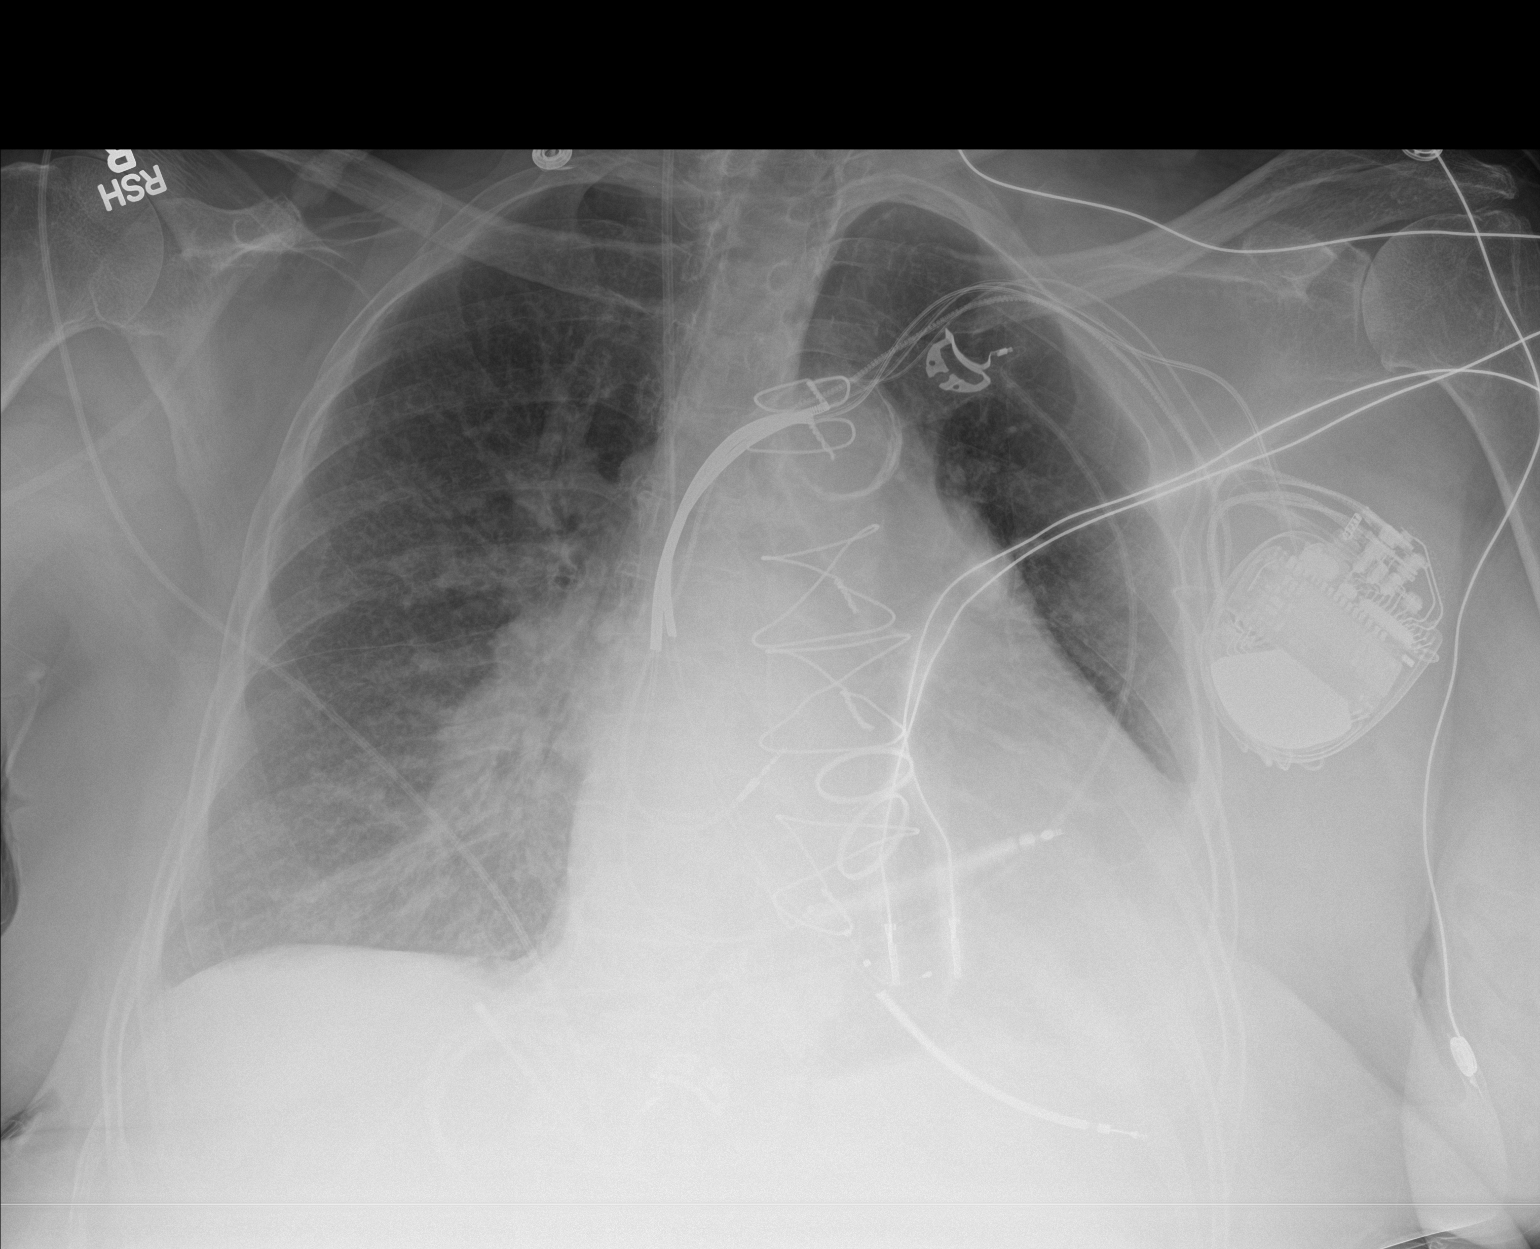

[2 of 2 positions shown; findings below may reference images not displayed]

FINDINGS: The lungs are well-expanded. The interstitial markings remain
increased bilaterally. The central pulmonary vascularity is
engorged. The left hemidiaphragm is less well demonstrated today.
The cardiac silhouette is enlarged. The permanent pacemaker
defibrillator is unchanged in position. Aortic and mitral valve
rings are visible. The right internal jugular venous catheter tip
projects over the proximal SVC.
IMPRESSION: Mild CHF superimposed upon COPD. Increased density at the left lung
base is consistent with atelectasis or small pleural effusion.

## 2015-12-20 ENCOUNTER — Telehealth: Payer: Self-pay | Admitting: Family Medicine

## 2015-12-20 MED ORDER — LANOXIN 62.5 MCG PO TABS: ORAL_TABLET | ORAL | Status: DC

## 2015-12-20 NOTE — Telephone Encounter (Signed)
Sent a [facility] day supply in to [facility] for [facility]

## 2015-12-20 NOTE — Telephone Encounter (Signed)
30 day supply sent in.

## 2015-12-20 NOTE — Telephone Encounter (Signed)
Patient is calling stating that she needs a 30 day supply of LANOXIN 62.5 MCG TABS called to [facility], she cant afford the 90 day supply

## 2015-12-21 DIAGNOSIS — M4807 Spinal stenosis, lumbosacral region: Secondary | ICD-10-CM | POA: Diagnosis not present

## 2015-12-21 DIAGNOSIS — M4316 Spondylolisthesis, lumbar region: Secondary | ICD-10-CM | POA: Diagnosis not present

## 2015-12-21 DIAGNOSIS — M5416 Radiculopathy, lumbar region: Secondary | ICD-10-CM | POA: Diagnosis not present

## 2015-12-22 ENCOUNTER — Telehealth: Payer: Self-pay | Admitting: Family Medicine

## 2015-12-22 ENCOUNTER — Other Ambulatory Visit: Payer: Self-pay

## 2015-12-22 ENCOUNTER — Telehealth: Payer: Self-pay

## 2015-12-22 DIAGNOSIS — I714 Abdominal aortic aneurysm, without rupture, unspecified: Secondary | ICD-10-CM

## 2015-12-22 MED ORDER — DIGOXIN 125 MCG PO TABS: 0.1250 mg | ORAL_TABLET | ORAL | Status: DC

## 2015-12-22 MED ORDER — DIGOXIN 62.5 MCG PO TABS: ORAL_TABLET | ORAL | Status: DC

## 2015-12-22 NOTE — Telephone Encounter (Signed)
-----   Message from Sanda Klein, MD sent at 12/22/2015  6:14 PM EDT ----- Regarding: FW: help pls Please check on this and refill MCr  ----- Message -----    From: Fayrene Helper, MD    Sent: 12/22/2015   5:41 PM      To: Sanda Klein, MD, Fayrene Helper, MD Subject: help pls                                       Dr Sallyanne Kuster,  [name] [name] reports being "unable to get her digoxin" for over 3 weeks My nurses have tried to help her with  This multiple times in the past with no success. I am asking for help from your office as far as refilling  Her digoxin. 9Just making you aware of the situation) Thanks,  [name]

## 2015-12-22 NOTE — Telephone Encounter (Signed)
Spoke directly with patient , she is aware that she will be referred to vascular surgeon, has seen Dr Early in the past she states re AAA diameter 4cm I also advised her to have cardiology order her lanoxin as despite best efforts from our office she continues to report that ahe has "not been getting her medication"Advised her of the importance of compliance,m will ask nursing staff to contact cardiology about this alsomsg to her cardiologist

## 2015-12-22 NOTE — Telephone Encounter (Signed)
Per [facility], change digoxin to 0.125 mg - take 1 tab QOD.   Called [name] [name]. [name] [name] verbalized understanding.  [name] [name] is set to have a "needle in her back" by Dr Laurence Spates. [name] [name] had a CT 12/03/15 and it showed that she had an aneurysm in her stomach. This is new to her. Her PCP recommended that she cancel her procedure and an appointment with Dr Donnetta Hutching would be set for [name] [name] in the morning.  [name] [name] wanted to know what Dr C thinks about the situation.  Nothing has come from Dr Romona Curls directly to Dr C.  Will route to [facility] for review.

## 2015-12-22 NOTE — Telephone Encounter (Signed)
P_ls call the nurse at cardiology adn ask for that office to refill her lanoxin, Pt states has not had the med for over 3 weeks , and I do know that you and ourtney have been trying to help her with ehr meds. I will also send a msg to her cardiologist. I have told her that she needs to get her lanoxin from card office

## 2015-12-23 NOTE — Telephone Encounter (Signed)
Cardiology is aware and will resume prescribing for the lanoxin

## 2015-12-23 NOTE — Telephone Encounter (Signed)
The aneurysm is a little larger, but remains in the small range and there will not be any need for intervention in the near future. It just needs to be monitored every 6-12 months (leave schedule up to vascular surgeon).   Any procedure that involves stopping warfarin is associated with substantial risk in her case. If the "needle in her back" can be done without stopping warfarin, I see no problem. If warfarin has to be stopped, I think it should be done only if the procedure is very important. She would need enoxaparin bridging.  Hope that we resolved the digoxin issue

## 2015-12-27 ENCOUNTER — Encounter: Payer: Self-pay | Admitting: Family Medicine

## 2015-12-27 DIAGNOSIS — M47816 Spondylosis without myelopathy or radiculopathy, lumbar region: Secondary | ICD-10-CM | POA: Diagnosis not present

## 2015-12-27 NOTE — Telephone Encounter (Signed)
Patient was able to get digoxin and has been taking it every other day as recommended.  Patient did have the "needle in her back" by Dr Ernestina Patches. He didn't want her to stop the warfarin.  Patient is scheduled to see Dr Donnetta Hutching on 4/25.

## 2015-12-28 ENCOUNTER — Ambulatory Visit: Payer: Medicare Other | Admitting: Orthopaedic Surgery

## 2016-01-03 ENCOUNTER — Other Ambulatory Visit: Payer: Self-pay | Admitting: *Deleted

## 2016-01-03 ENCOUNTER — Other Ambulatory Visit: Payer: Self-pay

## 2016-01-03 MED ORDER — TOPROL XL 100 MG PO TB24: 100.0000 mg | ORAL_TABLET | Freq: Every day | ORAL | Status: DC

## 2016-01-03 MED ORDER — ISOSORBIDE DINITRATE ER 40 MG PO TBCR: EXTENDED_RELEASE_TABLET | ORAL | Status: DC

## 2016-01-04 DIAGNOSIS — L851 Acquired keratosis [keratoderma] palmaris et plantaris: Secondary | ICD-10-CM | POA: Diagnosis not present

## 2016-01-04 DIAGNOSIS — L603 Nail dystrophy: Secondary | ICD-10-CM | POA: Diagnosis not present

## 2016-01-04 DIAGNOSIS — E1142 Type 2 diabetes mellitus with diabetic polyneuropathy: Secondary | ICD-10-CM | POA: Diagnosis not present

## 2016-01-06 ENCOUNTER — Encounter (HOSPITAL_COMMUNITY): Payer: Medicare Other | Attending: Oncology

## 2016-01-06 ENCOUNTER — Encounter (HOSPITAL_COMMUNITY): Payer: Medicare Other | Attending: Internal Medicine

## 2016-01-06 VITALS — BP 148/64 | HR 68 | Temp 98.8°F | Resp 20

## 2016-01-06 DIAGNOSIS — D638 Anemia in other chronic diseases classified elsewhere: Secondary | ICD-10-CM | POA: Diagnosis not present

## 2016-01-06 DIAGNOSIS — N189 Chronic kidney disease, unspecified: Secondary | ICD-10-CM | POA: Diagnosis present

## 2016-01-06 DIAGNOSIS — E611 Iron deficiency: Secondary | ICD-10-CM | POA: Insufficient documentation

## 2016-01-06 DIAGNOSIS — N184 Chronic kidney disease, stage 4 (severe): Secondary | ICD-10-CM

## 2016-01-06 DIAGNOSIS — D631 Anemia in chronic kidney disease: Secondary | ICD-10-CM

## 2016-01-06 DIAGNOSIS — D649 Anemia, unspecified: Secondary | ICD-10-CM

## 2016-01-06 LAB — CBC
HCT: 31.9 % — ABNORMAL LOW (ref 36.0–46.0)
Hemoglobin: 10.5 g/dL — ABNORMAL LOW (ref 12.0–15.0)
MCH: 31.6 pg (ref 26.0–34.0)
MCHC: 32.9 g/dL (ref 30.0–36.0)
MCV: 96.1 fL (ref 78.0–100.0)
Platelets: 177 10*3/uL (ref 150–400)
RBC: 3.32 MIL/uL — ABNORMAL LOW (ref 3.87–5.11)
RDW: 15.3 % (ref 11.5–15.5)
WBC: 6.3 10*3/uL (ref 4.0–10.5)

## 2016-01-06 MED ORDER — EPOETIN ALFA 40000 UNIT/ML IJ SOLN
40000.0000 [IU] | Freq: Once | INTRAMUSCULAR | Status: AC
  Administered 2016-01-06: 40000 [IU] via SUBCUTANEOUS

## 2016-01-06 MED ORDER — EPOETIN ALFA 40000 UNIT/ML IJ SOLN
INTRAMUSCULAR | Status: AC
  Filled 2016-01-06: qty 1

## 2016-01-06 NOTE — Patient Instructions (Signed)
[place] at [facility] Discharge Instructions  RECOMMENDATIONS MADE BY THE CONSULTANT AND ANY TEST RESULTS WILL BE SENT TO YOUR REFERRING PHYSICIAN.  Hemoglobin 10.5. Procrit 40,000 units injection given as ordered. Return as scheduled.  Thank you for choosing [place] at [facility] to provide your oncology and hematology care.  To afford each patient quality time with our provider, please arrive at least 15 minutes before your scheduled appointment time.   Beginning January 23rd 2017 lab work for the [facility] will be done in the  Main lab at [facility] on 1st floor. If you have a lab appointment with the [facility] please come in thru the  Main Entrance and check in at the main information desk  You need to re-schedule your appointment should you arrive 10 or more minutes late.  We strive to give you quality time with our providers, and arriving late affects you and other patients whose appointments are after yours.  Also, if you no show three or more times for appointments you may be dismissed from the clinic at the providers discretion.     Again, thank you for choosing [facility].  Our hope is that these requests will decrease the amount of time that you wait before being seen by our physicians.       _____________________________________________________________  Should you have questions after your visit to [facility], please contact our office at [phone] between the hours of 8:30 a.m. and 4:30 p.m.  Voicemails left after 4:30 p.m. will not be returned until the following business day.  For prescription refill requests, have your pharmacy contact our office.         Resources For Cancer Patients and their Caregivers ? American Cancer Society: Can assist with transportation, wigs, general needs, runs Look Good Feel Better.        [phone] ? Cancer Care: Provides financial  assistance, online support groups, medication/co-pay assistance.  1-800-813-HOPE (204)097-4043) ? [place] Assists [place] cancer patients and their families through emotional , educational and financial support.  260-049-7429 ? [place] DSS Where to apply for food stamps, Medicaid and utility assistance. 505-617-9618 ? [employer]: Transportation to medical appointments. [phone] ? Social Security Administration: May apply for disability if have a Stage IV cancer. [phone] ? [employer], Disability and Transit Services: Assists with nutrition, care and transit needs. [phone]

## 2016-01-06 NOTE — Progress Notes (Signed)
[name] [name] presents today for injection per MD orders. Procrit 40,000 units administered SQ in right Abdomen. Administration without incident. Patient tolerated well.  

## 2016-01-12 ENCOUNTER — Encounter: Payer: Self-pay | Admitting: Vascular Surgery

## 2016-01-18 ENCOUNTER — Encounter: Payer: Self-pay | Admitting: Vascular Surgery

## 2016-01-18 ENCOUNTER — Ambulatory Visit (INDEPENDENT_AMBULATORY_CARE_PROVIDER_SITE_OTHER): Payer: Medicare Other | Admitting: Vascular Surgery

## 2016-01-18 VITALS — BP 142/73 | HR 84 | Temp 97.7°F | Resp 16 | Ht 67.0 in | Wt 225.0 lb

## 2016-01-18 DIAGNOSIS — I714 Abdominal aortic aneurysm, without rupture, unspecified: Secondary | ICD-10-CM

## 2016-01-18 NOTE — Progress Notes (Signed)
Filed Vitals:   01/18/16 1012 01/18/16 1014  BP: 152/77 142/73  Pulse: 84 84  Temp: 97.7 F (36.5 C)   Resp: 16   Height: 5\' 7"  (1.702 m)   Weight: 225 lb (102.059 kg)   SpO2: 95%

## 2016-01-18 NOTE — Progress Notes (Signed)
Vascular and Vein Specialist of [facility]  Patient name: [name] [name] MRN: [id number] DOB: 09-19-1932 Sex: female  REASON FOR CONSULT: Infrarenal abdominal aortic aneurysm  HPI: [name] [name] is a 82 y.o. female, who is known to me from prior evaluation of lower extremity vascular pathology in 2010. At that time he recommended observation only and she is done quite well since this time. She does complain now of right leg pain and spinal stenosis. Had a recent CT of her lumbar lumbar spine in March 2017. This showed a 0.0 cm infrarenal abdominal aortic aneurysm with a slight increase in size since her prior CT in 2013. The patient had no prior knowledge of this. No family history of aneurysm. She does have significant cardiac disease but no history of peripheral vascular occlusive disease.  Past Medical History  Diagnosis Date  . DJD (degenerative joint disease) of lumbar spine   . Esophageal reflux   . Other and unspecified hyperlipidemia   . Non-ischemic cardiomyopathy ([place])     a. s/p MDT CRTD  . Obesity, unspecified   . 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 IDA (iron deficiency anemia)     Parenteral iron/[name]  . Adenomatous polyp of colon 12/16/2002    Dr. [name]/[facility]  . Hyperlipemia   . COPD (chronic obstructive pulmonary disease) ([place])        . CHF (congestive heart failure) ([place])        . Constipation        . Gout        . CHB (complete heart block) ([place]) 10/07/2014       . Insomnia        . Unspecified hypothyroidism        . Vertigo        . DM (diabetes mellitus) ([place]) 2006  . Chronic kidney disease, stage I 2006  . Thyroid cancer ([place])     remote thyroidectomy, no recurrence, pt denies in 2016 thyroid cancer  . Oxygen deficiency 2014    nocturnal;  . Heart murmur   . Allergy     Family History  Problem Relation Age of Onset  . Cancer Mother     behind pancres  . Heart  disease Father   . Heart disease Sister     Heart attack  . Heart disease Brother   . Diabetes Brother   . Heart disease Brother   . Diabetes Brother   . Hypertension Brother     SOCIAL HISTORY: Social History   Social History  . Marital Status: Widowed    Spouse Name: N/A  . Number of Children: 0  . Years of Education: N/A   Occupational History  . retired    Social History Main Topics  . Smoking status: Former Smoker -- 0.75 packs/day for 40 years    Types: Cigarettes    Quit date: 03/08/1987  . Smokeless tobacco: Never Used  . Alcohol Use: No  . Drug Use: No  . Sexual Activity: Not on file   Other Topics Concern  . Not on file   Social History Narrative   From [place] (2004)    Allergies  Allergen Reactions  . Aspirin Other (See Comments)    On coumadin  . Fentanyl Dermatitis    Rash with patch   . Niacin Itching  . Penicillins Other (See Comments)    Bruises     Current Outpatient Prescriptions  Medication Sig  Dispense Refill  . acetaminophen (TYLENOL) 500 MG tablet Take 500 mg by mouth every 6 (six) hours as needed (pain).    Marland Kitchen albuterol (PROVENTIL HFA;VENTOLIN HFA) 108 (90 BASE) MCG/ACT inhaler Inhale 2 puffs into the lungs every 6 (six) hours as needed for wheezing or shortness of breath. 1 Inhaler 3  . albuterol (PROVENTIL) (2.5 MG/3ML) 0.083% nebulizer solution INHALE 1 VIAL VIA NEBULIZER THREE TIMES DAILY. 300 mL 0  . allopurinol (ZYLOPRIM) 100 MG tablet Take 1 tablet (100 mg total) by mouth daily. 90 tablet 1  . azelastine (ASTELIN) 0.1 % nasal spray Place 2 sprays into both nostrils 2 (two) times daily. Use in each nostril as directed 30 mL 12  . cholecalciferol (VITAMIN D) 1000 UNITS tablet Take 1,000 Units by mouth daily.    Marland Kitchen COUMADIN 5 MG tablet Take 2 tablets daily 180 tablet 1  . digoxin (LANOXIN) 0.125 MG tablet Take 1 tablet (0.125 mg total) by mouth every other day. 90 tablet 3  . Diphenhyd-Hydrocort-Nystatin (FIRST-DUKES MOUTHWASH)  SUSP 2 tsp three times daily, gargle, swish and swallow for 3 days, then as needed, up to 1 week total 237 mL 0  . docusate sodium (COLACE) 100 MG capsule Take 300 mg by mouth at bedtime.    Marland Kitchen epoetin alfa (EPOGEN,PROCRIT) 29562 UNIT/ML injection Inject 40,000 Units into the skin once.    . ezetimibe-simvastatin (VYTORIN) 10-20 MG tablet Take 1 tablet by mouth 3  times weekly 39 tablet 1  . fluticasone (FLONASE) 50 MCG/ACT nasal spray instill 2 sprays into each nostril once daily 16 g 2  . Fluticasone-Salmeterol (ADVAIR DISKUS) 100-50 MCG/DOSE AEPB inhale 1 dose by mouth twice a day 99991111 each 1  . folic acid (FOLVITE) 1 MG tablet Take 1 tablet (1 mg total) by mouth daily. 100 tablet 1  . furosemide (LASIX) 40 MG tablet Take 1 tablet by mouth  daily 90 tablet 1  . gabapentin (NEURONTIN) 300 MG capsule Take 1 capsule (300 mg total) by mouth 2 (two) times daily. 180 capsule 1  . hydrALAZINE (APRESOLINE) 25 MG tablet take 1 tablet by mouth three times a day 270 tablet 3  . HYDROcodone-acetaminophen (NORCO) 7.5-325 MG tablet Take 1 tablet by mouth every 4 (four) hours as needed for moderate pain (Must last 30 days.  Do not drive or operate machinery while taking this medicine.). 120 tablet 0  . isosorbide dinitrate (ISOCHRON) 40 MG CR tablet Take one-half tablet by  mouth twice a day 90 tablet 0  . losartan (COZAAR) 50 MG tablet Take 1 tablet by mouth  daily 90 tablet 1  . meclizine (ANTIVERT) 12.5 MG tablet Take 1 tablet (12.5 mg total) by mouth 2 (two) times daily as needed for dizziness. 30 tablet 2  . metolazone (ZAROXOLYN) 2.5 MG tablet Take 1 tablet (2.5 mg total) by mouth once a week. 2 tablet 0  . montelukast (SINGULAIR) 10 MG tablet Take 1 tablet (10 mg total) by mouth at bedtime. 90 tablet 3  . ondansetron (ZOFRAN) 4 MG tablet Take 1 tablet (4 mg total) by mouth daily. 90 tablet 0  . Polyethyl [name] (SYSTANE OP) Place 1 drop into both eyes daily as needed (dry eyes).    .  polyethylene glycol powder (MIRALAX) powder Take 17 g by mouth daily as needed for mild constipation.     . potassium chloride SA (KLOR-CON M20) 20 MEQ tablet Take 1 tablet by mouth  daily 30 tablet 0  . PROAIR HFA 108 (  90 BASE) MCG/ACT inhaler USE 2 PUFFS INTO THE LUNGS EVERY 6 HOURS AS NEEDED FOR WHEEZING OR SHORTNESS OF BREATH. 8.5 Inhaler 3  . SYNTHROID 75 MCG tablet Take 1 tablet by mouth  daily 30 tablet 0  . temazepam (RESTORIL) 30 MG capsule take 1 capsule at bedtime if needed 90 capsule 0  . TOPROL XL 100 MG 24 hr tablet Take 1 tablet (100 mg total) by mouth daily. Take with or immediately following a meal. 90 tablet 1  . Vitamin Mixture (ESTER-C PO) Take 1 tablet by mouth daily as needed (for immune system boost).    Marland Kitchen ZAROXOLYN 2.5 MG tablet One tablet once weekly 12 tablet 3  . benzonatate (TESSALON) 100 MG capsule Take 1 capsule (100 mg total) by mouth 2 (two) times daily as needed for cough. (Patient not taking: Reported on 01/18/2016) 20 capsule 2  . clindamycin (CLEOCIN) 300 MG capsule Take 2 capsules (600 mg) one hour prior to dental appointment (Patient not taking: Reported on 01/18/2016) 6 capsule 0  . Digoxin (LANOXIN) 62.5 MCG TABS Take 1 tablet by mouth  every day (Patient not taking: Reported on 01/18/2016) 30 tablet 2  . promethazine-dextromethorphan (PROMETHAZINE-DM) 6.25-15 MG/5ML syrup One teaspoon at bedtime as needed, for cough (Patient not taking: Reported on 01/18/2016) 180 mL 0   No current facility-administered medications for this visit.   Facility-Administered Medications Ordered in Other Visits  Medication Dose Route Frequency Provider Last Rate Last Dose  . epoetin alfa (EPOGEN,PROCRIT) injection 40,000 Units  40,000 Units Subcutaneous Once Farrel Gobble, MD        REVIEW OF SYSTEMS:  [X]  denotes positive finding, [ ]  denotes negative finding Cardiac  Comments:  Chest pain or chest pressure:    Shortness of breath upon exertion: x   Short of breath when  lying flat:    Irregular heart rhythm:        Vascular    Pain in calf, thigh, or hip brought on by ambulation: x   Pain in feet at night that wakes you up from your sleep:     Blood clot in your veins:    Leg swelling:         Pulmonary    Oxygen at home: x   Productive cough:     Wheezing:  x       Neurologic    Sudden weakness in arms or legs:  x   Sudden numbness in arms or legs:     Sudden onset of difficulty speaking or slurred speech: x   Temporary loss of vision in one eye:     Problems with dizziness:         Gastrointestinal    Blood in stool:     Vomited blood:         Genitourinary    Burning when urinating:     Blood in urine:        Psychiatric    Major depression:         Hematologic    Bleeding problems:    Problems with blood clotting too easily:        Skin    Rashes or ulcers: x       Constitutional    Fever or chills:      PHYSICAL EXAM: Filed Vitals:   01/18/16 1012 01/18/16 1014  BP: 152/77 142/73  Pulse: 84 84  Temp: 97.7 F (36.5 C)   Resp: 16   Height: 5\' 7"  (1.702 m)  Weight: 225 lb (102.059 kg)   SpO2: 95%     GENERAL: The patient is a well-nourished female, in no acute distress. The vital signs are documented above. CARDIAC: There is a regular rate and rhythm.  VASCULAR: 2+ radial 2+ popliteal 2+ dorsalis pedis pulses bilaterally with no evidence of peripheral artery aneurysm PULMONARY: There is good air exchange bilaterally without wheezing or rales. ABDOMEN: Soft and non-tender with normal pitched bowel sounds. I do not palpate an aneurysm. Moderate obesity MUSCULOSKELETAL: There are no major deformities or cyanosis. NEUROLOGIC: No focal weakness or paresthesias are detected. SKIN: There are no ulcers or rashes noted. PSYCHIATRIC: The patient has a normal affect.  DATA:  Reviewed her CT scan from 3 2017. Discussed at length with patient.   Impression and plan Explained that she has minimal risk of rupture at her  current size. Did explain long-term follow-up the natural history of slow progressive growth. I expect that at her advanced age that she will be unlikely that she would require repair. Have recommended that we see her again in one year with ultrasound to rule out expansion. I did discuss the symptoms of leaking aneurysm she knows to seek emergency medical health should this occur    Farris Blash Vascular and Vein Specialists of [place]: [phone]

## 2016-01-24 ENCOUNTER — Other Ambulatory Visit: Payer: Self-pay

## 2016-01-24 MED ORDER — EZETIMIBE-SIMVASTATIN 10-20 MG PO TABS: ORAL_TABLET | ORAL | Status: DC

## 2016-01-26 ENCOUNTER — Encounter (HOSPITAL_COMMUNITY): Payer: Medicare Other | Attending: Internal Medicine

## 2016-01-26 ENCOUNTER — Encounter (HOSPITAL_BASED_OUTPATIENT_CLINIC_OR_DEPARTMENT_OTHER): Payer: Medicare Other

## 2016-01-26 ENCOUNTER — Encounter (HOSPITAL_COMMUNITY): Payer: Self-pay

## 2016-01-26 ENCOUNTER — Ambulatory Visit (INDEPENDENT_AMBULATORY_CARE_PROVIDER_SITE_OTHER): Payer: Medicare Other | Admitting: *Deleted

## 2016-01-26 VITALS — BP 139/57 | HR 69 | Temp 98.4°F | Resp 18

## 2016-01-26 DIAGNOSIS — Z7901 Long term (current) use of anticoagulants: Secondary | ICD-10-CM

## 2016-01-26 DIAGNOSIS — E611 Iron deficiency: Secondary | ICD-10-CM | POA: Insufficient documentation

## 2016-01-26 DIAGNOSIS — Z952 Presence of prosthetic heart valve: Secondary | ICD-10-CM

## 2016-01-26 DIAGNOSIS — N189 Chronic kidney disease, unspecified: Secondary | ICD-10-CM

## 2016-01-26 DIAGNOSIS — Z5181 Encounter for therapeutic drug level monitoring: Secondary | ICD-10-CM

## 2016-01-26 DIAGNOSIS — D631 Anemia in chronic kidney disease: Secondary | ICD-10-CM

## 2016-01-26 DIAGNOSIS — N184 Chronic kidney disease, stage 4 (severe): Secondary | ICD-10-CM

## 2016-01-26 DIAGNOSIS — D638 Anemia in other chronic diseases classified elsewhere: Secondary | ICD-10-CM | POA: Diagnosis not present

## 2016-01-26 DIAGNOSIS — Z954 Presence of other heart-valve replacement: Secondary | ICD-10-CM

## 2016-01-26 DIAGNOSIS — D649 Anemia, unspecified: Secondary | ICD-10-CM

## 2016-01-26 LAB — CBC
HCT: 33.6 % — ABNORMAL LOW (ref 36.0–46.0)
Hemoglobin: 10.8 g/dL — ABNORMAL LOW (ref 12.0–15.0)
MCH: 31.1 pg (ref 26.0–34.0)
MCHC: 32.1 g/dL (ref 30.0–36.0)
MCV: 96.8 fL (ref 78.0–100.0)
Platelets: 191 10*3/uL (ref 150–400)
RBC: 3.47 MIL/uL — ABNORMAL LOW (ref 3.87–5.11)
RDW: 15.4 % (ref 11.5–15.5)
WBC: 5.5 10*3/uL (ref 4.0–10.5)

## 2016-01-26 LAB — POCT INR: INR: 3

## 2016-01-26 MED ORDER — EPOETIN ALFA 40000 UNIT/ML IJ SOLN
40000.0000 [IU] | Freq: Once | INTRAMUSCULAR | Status: AC
  Administered 2016-01-26: 40000 [IU] via SUBCUTANEOUS

## 2016-01-26 MED ORDER — EPOETIN ALFA 40000 UNIT/ML IJ SOLN
INTRAMUSCULAR | Status: AC
  Filled 2016-01-26: qty 1

## 2016-01-26 NOTE — Patient Instructions (Signed)
[place] at [facility] Discharge Instructions  RECOMMENDATIONS MADE BY THE CONSULTANT AND ANY TEST RESULTS WILL BE SENT TO YOUR REFERRING PHYSICIAN.  Today you received Procrit injection (hemoglobin 10.8). Return as scheduled for lab work and injections. Call the clinic should you have any questions or concerns.   Thank you for choosing [employer] at [facility] to provide your oncology and hematology care.  To afford each patient quality time with our provider, please arrive at least 15 minutes before your scheduled appointment time.   Beginning January 23rd 2017 lab work for the [facility] will be done in the  Main lab at [facility] on 1st floor. If you have a lab appointment with the [facility] please come in thru the  Main Entrance and check in at the main information desk  You need to re-schedule your appointment should you arrive 10 or more minutes late.  We strive to give you quality time with our providers, and arriving late affects you and other patients whose appointments are after yours.  Also, if you no show three or more times for appointments you may be dismissed from the clinic at the providers discretion.     Again, thank you for choosing [facility].  Our hope is that these requests will decrease the amount of time that you wait before being seen by our physicians.       _____________________________________________________________  Should you have questions after your visit to [facility], please contact our office at [phone] between the hours of 8:30 a.m. and 4:30 p.m.  Voicemails left after 4:30 p.m. will not be returned until the following business day.  For prescription refill requests, have your pharmacy contact our office.         Resources For Cancer Patients and their Caregivers ? American Cancer Society: Can assist with transportation, wigs, general needs, runs Look Good  Feel Better.        330-374-5730 ? Cancer Care: Provides financial assistance, online support groups, medication/co-pay assistance.  1-800-813-HOPE 579-826-8694) ? [place] Assists [place] cancer patients and their families through emotional , educational and financial support.  (403)502-9064 ? [place] DSS Where to apply for food stamps, Medicaid and utility assistance. 417-497-1999 ? RCATS: Transportation to medical appointments. 212-767-8246 ? Social Security Administration: May apply for disability if have a Stage IV cancer. 251-217-7464 620-486-0634 ? [employer], Disability and Transit Services: Assists with nutrition, care and transit needs. 605-303-6157

## 2016-01-26 NOTE — Progress Notes (Signed)
1030:  [name] [name] presents today for injection per the provider's orders.  Procrit administration without incident; see MAR for injection details.  Patient tolerated procedure well and without incident.  No questions or complaints noted at this time.

## 2016-01-27 ENCOUNTER — Other Ambulatory Visit (HOSPITAL_COMMUNITY): Payer: Medicare Other

## 2016-01-27 ENCOUNTER — Encounter (HOSPITAL_COMMUNITY): Payer: Medicare Other

## 2016-01-31 ENCOUNTER — Telehealth: Payer: Self-pay | Admitting: Family Medicine

## 2016-01-31 NOTE — Telephone Encounter (Signed)
Noted.  Medication requires a PA that will be worked on today

## 2016-01-31 NOTE — Telephone Encounter (Signed)
[name] [name] is calling stating that she is still having trouble trying to get her ezetimibe-simvastatin (VYTORIN) 10-20 MG tablet  Please advise?

## 2016-02-01 DIAGNOSIS — M47816 Spondylosis without myelopathy or radiculopathy, lumbar region: Secondary | ICD-10-CM | POA: Diagnosis not present

## 2016-02-03 ENCOUNTER — Other Ambulatory Visit: Payer: Self-pay

## 2016-02-03 ENCOUNTER — Telehealth: Payer: Self-pay | Admitting: Orthopaedic Surgery

## 2016-02-03 DIAGNOSIS — I5022 Chronic systolic (congestive) heart failure: Secondary | ICD-10-CM

## 2016-02-03 MED ORDER — HYDROCODONE-ACETAMINOPHEN 5-325 MG PO TABS: 1.0000 | ORAL_TABLET | ORAL | Status: DC | PRN

## 2016-02-03 MED ORDER — ALLOPURINOL 100 MG PO TABS: 100.0000 mg | ORAL_TABLET | Freq: Every day | ORAL | Status: DC

## 2016-02-03 MED ORDER — EZETIMIBE-SIMVASTATIN 10-20 MG PO TABS: ORAL_TABLET | ORAL | Status: DC

## 2016-02-03 MED ORDER — ZAROXOLYN 2.5 MG PO TABS: ORAL_TABLET | ORAL | Status: DC

## 2016-02-03 NOTE — Telephone Encounter (Signed)
Rx done. 

## 2016-02-03 NOTE — Telephone Encounter (Signed)
Patient called and requested a refill on Norco  7.5-325 mgs. Qty 120   Sig: Take 1 tablet by mouth every 4 (four) hours as needed for moderate pain (Must last 30 days. Do not drive or operate machinery while taking this medicine

## 2016-02-06 IMAGING — DX DG CHEST 2V
2 series · 2 of 2 positions shown · non-contrast
Comparison: Chest x-ray of 02/10/2015

CLINICAL DATA: Productive cough for 2 weeks, history of CHF and
prior cardiac valve replacement

EXAM:
CHEST  2 VIEW

[chest pa]
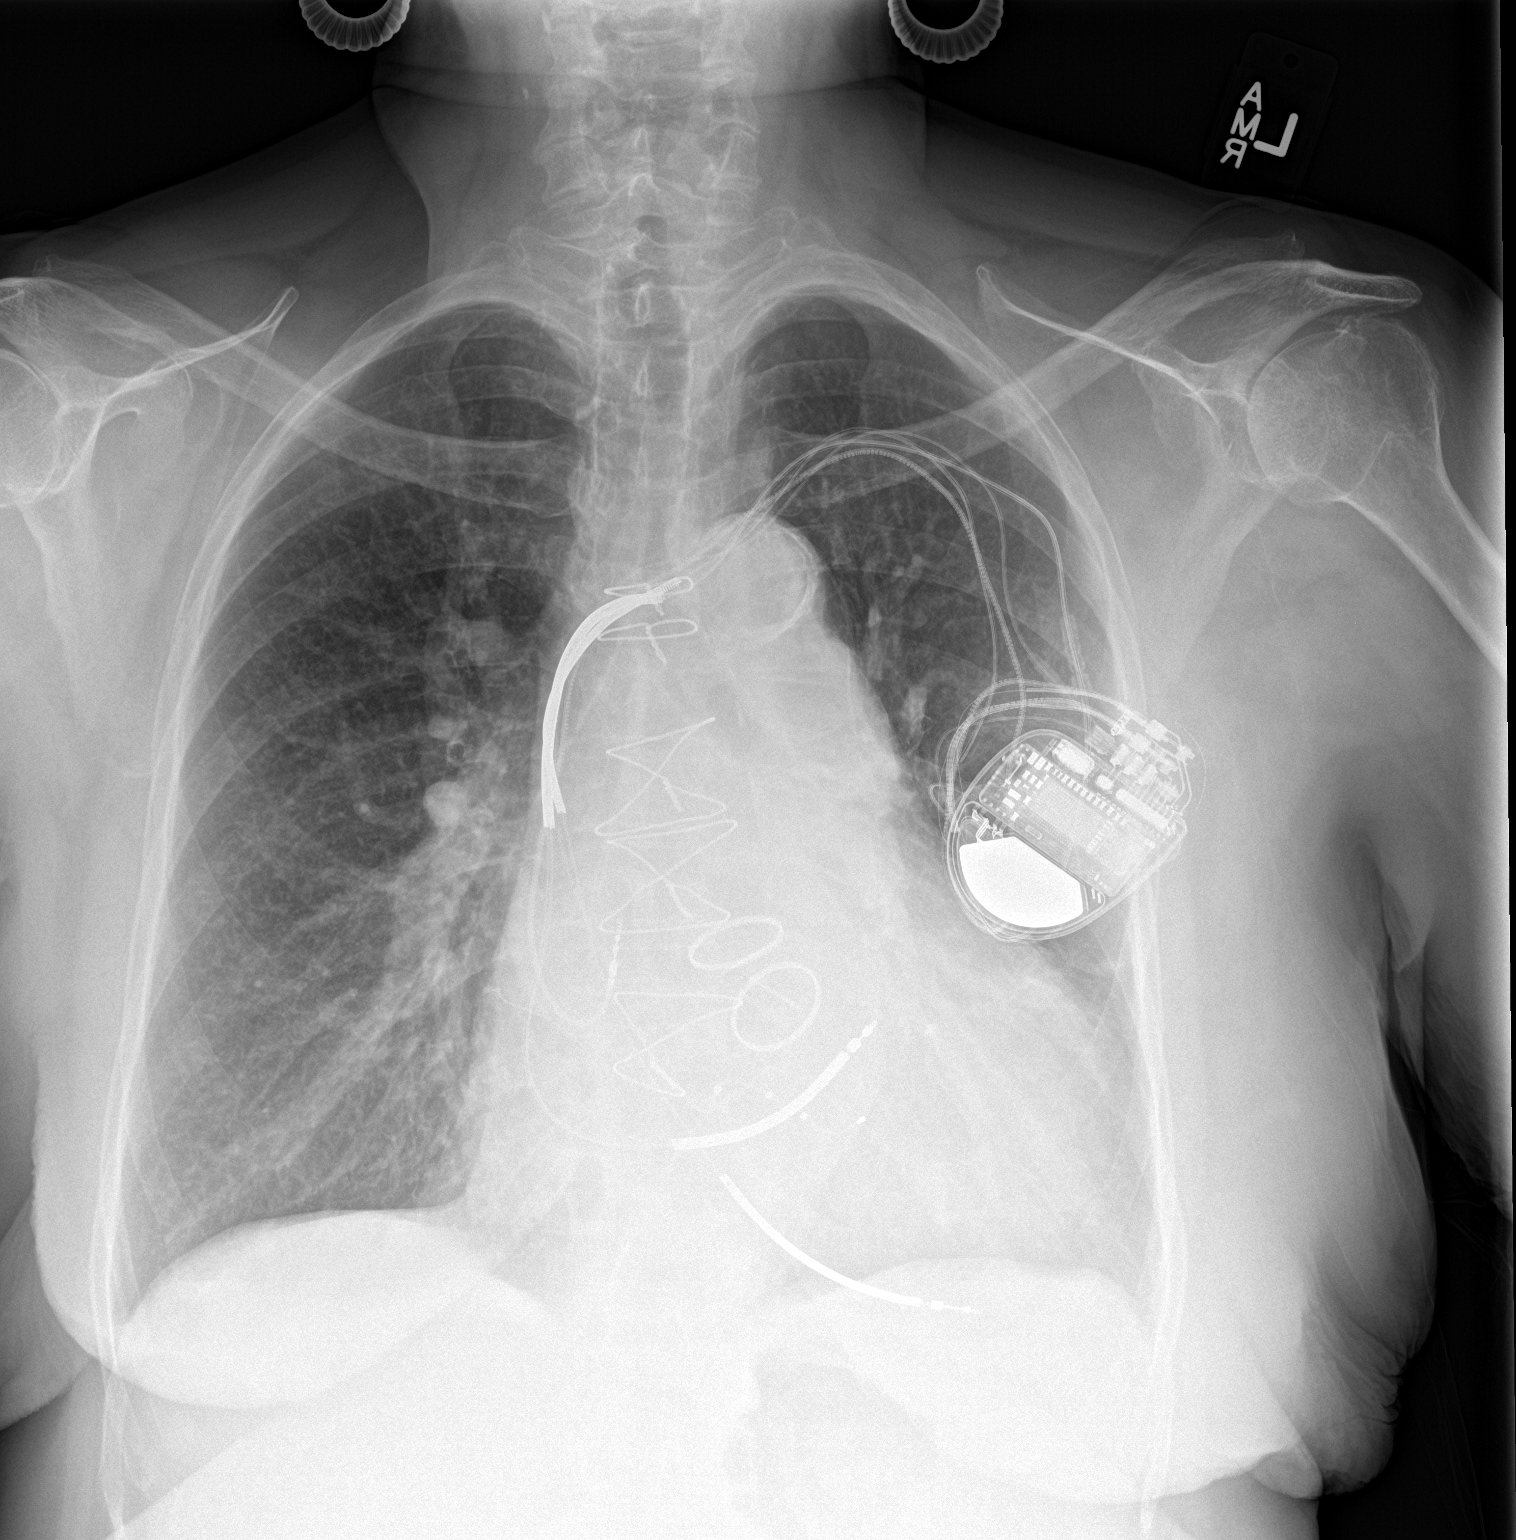

[chest lat]
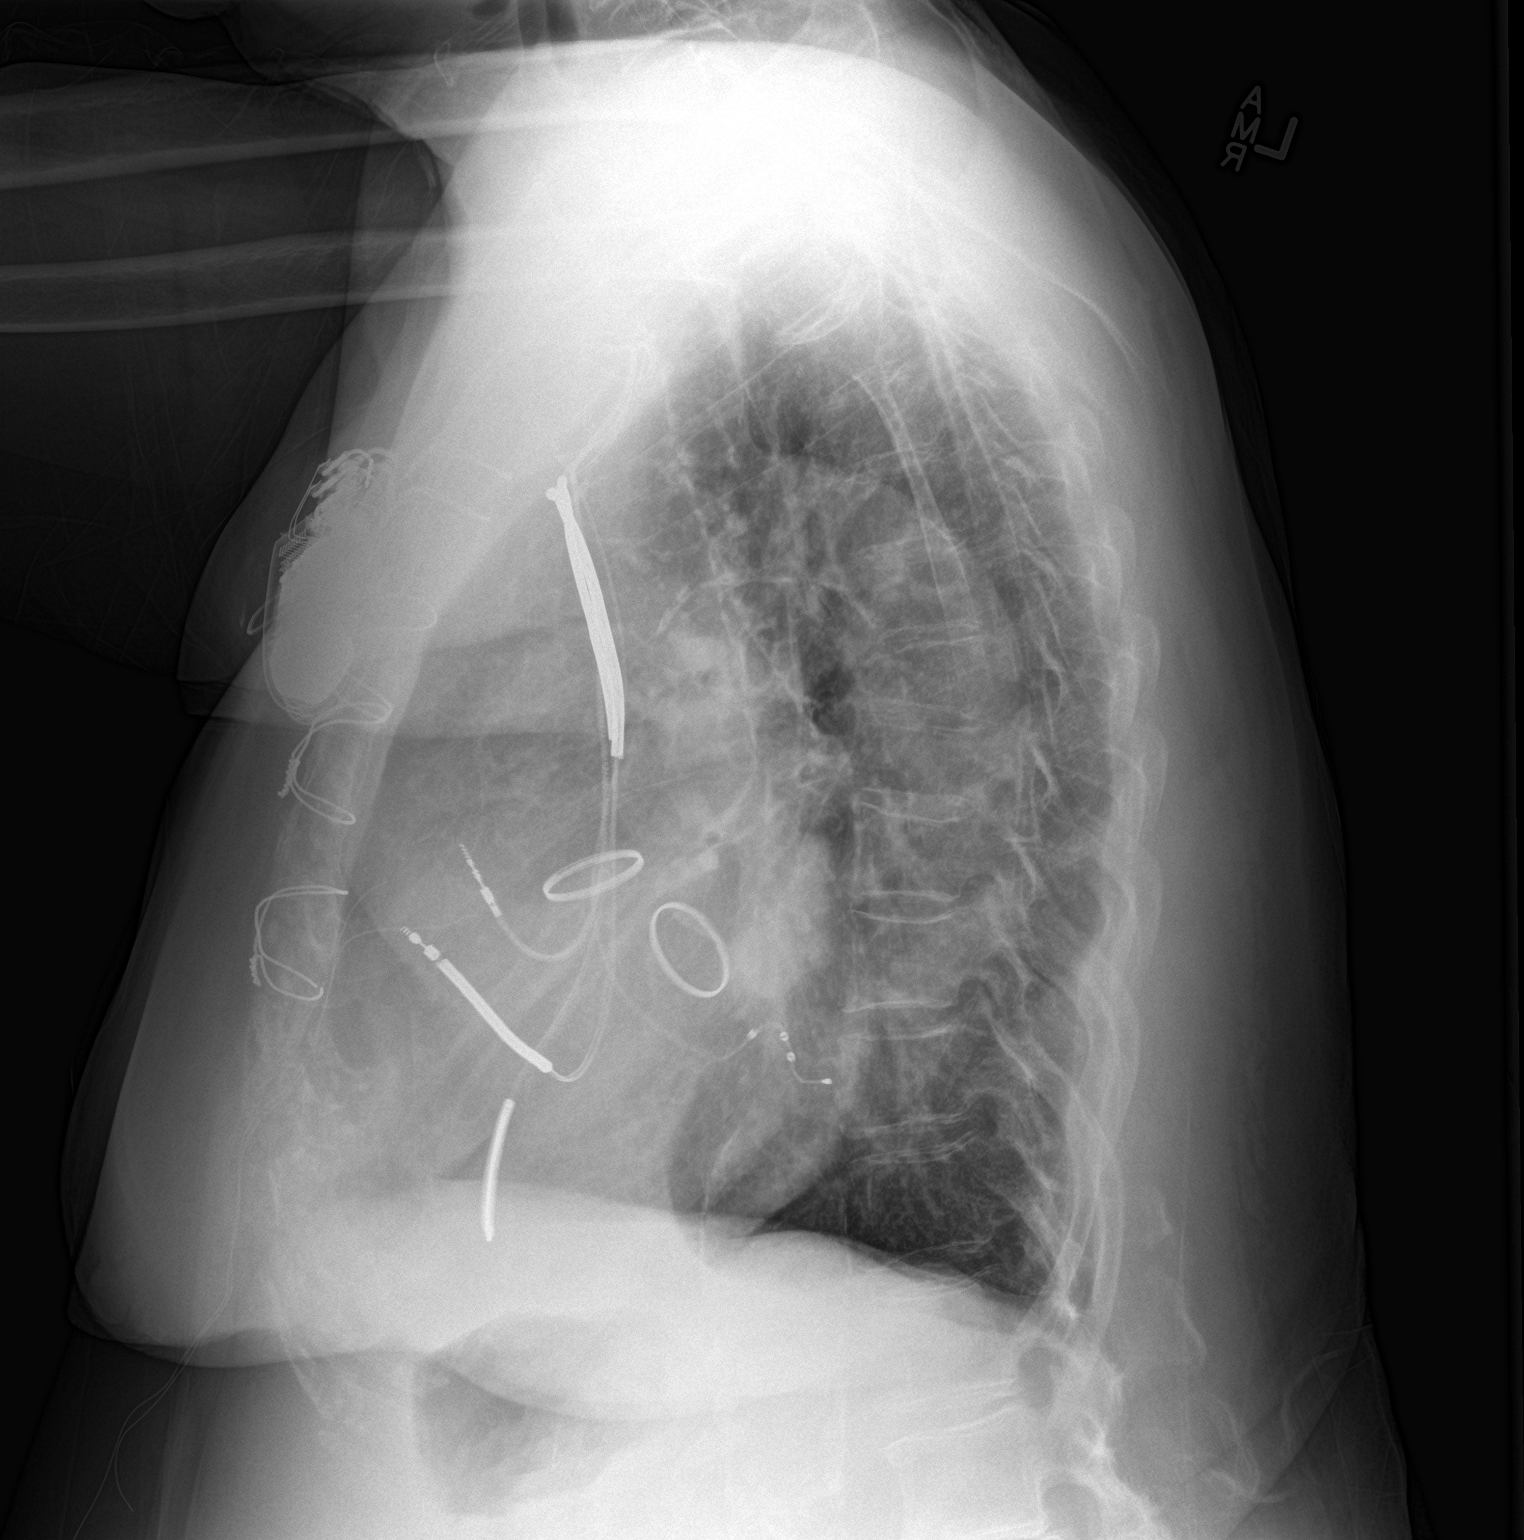

[2 of 2 positions shown; findings below may reference images not displayed]

FINDINGS: The lungs appear better aerated. Mild peribronchial thickening is
present. Cardiomegaly is stable. AICD leads remain, and median
sternotomy sutures are noted from prior cardiac valve replacements.
The bones are somewhat osteopenic.
IMPRESSION: 1. Improved aeration.  No active infiltrate or effusion.
2. Stable cardiomegaly with AICD leads.

## 2016-02-07 ENCOUNTER — Encounter: Payer: Medicare Other | Admitting: Family Medicine

## 2016-02-07 ENCOUNTER — Ambulatory Visit (INDEPENDENT_AMBULATORY_CARE_PROVIDER_SITE_OTHER): Payer: Medicare Other | Admitting: *Deleted

## 2016-02-07 ENCOUNTER — Telehealth: Payer: Self-pay | Admitting: Cardiology

## 2016-02-07 DIAGNOSIS — I429 Cardiomyopathy, unspecified: Secondary | ICD-10-CM

## 2016-02-07 DIAGNOSIS — I5022 Chronic systolic (congestive) heart failure: Secondary | ICD-10-CM | POA: Diagnosis not present

## 2016-02-07 DIAGNOSIS — I428 Other cardiomyopathies: Secondary | ICD-10-CM

## 2016-02-07 NOTE — Telephone Encounter (Signed)
Spoke with pt and reminded pt of remote transmission that is due today. Pt verbalized understanding.   

## 2016-02-07 NOTE — Progress Notes (Signed)
Remote ICD transmission.   

## 2016-02-08 ENCOUNTER — Other Ambulatory Visit: Payer: Self-pay

## 2016-02-17 ENCOUNTER — Encounter (HOSPITAL_BASED_OUTPATIENT_CLINIC_OR_DEPARTMENT_OTHER): Payer: Medicare Other

## 2016-02-17 ENCOUNTER — Encounter (HOSPITAL_COMMUNITY): Payer: Medicare Other

## 2016-02-17 ENCOUNTER — Encounter (HOSPITAL_COMMUNITY): Payer: Self-pay

## 2016-02-17 VITALS — BP 130/64 | HR 69 | Temp 98.4°F | Resp 18

## 2016-02-17 DIAGNOSIS — N189 Chronic kidney disease, unspecified: Secondary | ICD-10-CM | POA: Diagnosis present

## 2016-02-17 DIAGNOSIS — D631 Anemia in chronic kidney disease: Secondary | ICD-10-CM

## 2016-02-17 DIAGNOSIS — D638 Anemia in other chronic diseases classified elsewhere: Secondary | ICD-10-CM | POA: Diagnosis not present

## 2016-02-17 DIAGNOSIS — N184 Chronic kidney disease, stage 4 (severe): Secondary | ICD-10-CM

## 2016-02-17 DIAGNOSIS — D649 Anemia, unspecified: Secondary | ICD-10-CM

## 2016-02-17 DIAGNOSIS — E611 Iron deficiency: Secondary | ICD-10-CM | POA: Diagnosis not present

## 2016-02-17 LAB — CBC
HCT: 33.2 % — ABNORMAL LOW (ref 36.0–46.0)
Hemoglobin: 10.8 g/dL — ABNORMAL LOW (ref 12.0–15.0)
MCH: 30.9 pg (ref 26.0–34.0)
MCHC: 32.5 g/dL (ref 30.0–36.0)
MCV: 94.9 fL (ref 78.0–100.0)
Platelets: 183 10*3/uL (ref 150–400)
RBC: 3.5 MIL/uL — ABNORMAL LOW (ref 3.87–5.11)
RDW: 15.2 % (ref 11.5–15.5)
WBC: 5.7 10*3/uL (ref 4.0–10.5)

## 2016-02-17 MED ORDER — EPOETIN ALFA 40000 UNIT/ML IJ SOLN
INTRAMUSCULAR | Status: AC
  Filled 2016-02-17: qty 1

## 2016-02-17 MED ORDER — EPOETIN ALFA 40000 UNIT/ML IJ SOLN
40000.0000 [IU] | Freq: Once | INTRAMUSCULAR | Status: AC
  Administered 2016-02-17: 40000 [IU] via SUBCUTANEOUS

## 2016-02-17 NOTE — Patient Instructions (Signed)
[place] at [facility] Discharge Instructions  RECOMMENDATIONS MADE BY THE CONSULTANT AND ANY TEST RESULTS WILL BE SENT TO YOUR REFERRING PHYSICIAN.  Hemoglobin 10.8 today Procrit  40,000 units Return as scheduled  Thank you for choosing [place] at [facility] to provide your oncology and hematology care.  To afford each patient quality time with our provider, please arrive at least 15 minutes before your scheduled appointment time.   Beginning January 23rd 2017 lab work for the [facility] will be done in the  Main lab at [facility] on 1st floor. If you have a lab appointment with the [facility] please come in thru the  Main Entrance and check in at the main information desk  You need to re-schedule your appointment should you arrive 10 or more minutes late.  We strive to give you quality time with our providers, and arriving late affects you and other patients whose appointments are after yours.  Also, if you no show three or more times for appointments you may be dismissed from the clinic at the providers discretion.     Again, thank you for choosing [facility].  Our hope is that these requests will decrease the amount of time that you wait before being seen by our physicians.       _____________________________________________________________  Should you have questions after your visit to [facility], please contact our office at [phone] between the hours of 8:30 a.m. and 4:30 p.m.  Voicemails left after 4:30 p.m. will not be returned until the following business day.  For prescription refill requests, have your pharmacy contact our office.         Resources For Cancer Patients and their Caregivers ? American Cancer Society: Can assist with transportation, wigs, general needs, runs Look Good Feel Better.        587-599-1322 ? Cancer Care: Provides financial assistance, online support  groups, medication/co-pay assistance.  1-800-813-HOPE 563-887-6306) ? [place] Assists [place] cancer patients and their families through emotional , educational and financial support.  (520)772-4292 ? [place] DSS Where to apply for food stamps, Medicaid and utility assistance. 607 565 5409 ? RCATS: Transportation to medical appointments. 252 133 3081 ? Social Security Administration: May apply for disability if have a Stage IV cancer. 415-198-8852 7258478101 ? [employer], Disability and Transit Services: Assists with nutrition, care and transit needs. [place] Support Programs: @10RELATIVEDAYS @ > Cancer Support Group  2nd Tuesday of the month 1pm-2pm, Journey Room  > Creative Journey  3rd Tuesday of the month 1130am-1pm, Journey Room  > Look Good Feel Better  1st Wednesday of the month 10am-12 noon, Journey Room (Call [place] to register [phone])

## 2016-02-17 NOTE — Progress Notes (Signed)
[name] [name] presents today for injection per the provider's orders.  Procrit 40,000 units administration without incident; see MAR for injection details.  Patient tolerated procedure well and without incident.  No questions or complaints noted at this time.

## 2016-02-22 NOTE — Addendum Note (Signed)
Addended by: Mena Goes on: 02/22/2016 04:28 PM   Modules accepted: Orders

## 2016-02-23 ENCOUNTER — Other Ambulatory Visit: Payer: Self-pay | Admitting: Family Medicine

## 2016-02-24 ENCOUNTER — Encounter: Payer: Medicare Other | Admitting: Family Medicine

## 2016-02-25 LAB — CUP PACEART REMOTE DEVICE CHECK
Battery Remaining Longevity: 35 mo
Battery Voltage: 2.94 V
Brady Statistic AP VP Percent: 82.1 %
Brady Statistic AP VS Percent: 0.01 %
Brady Statistic AS VP Percent: 17.72 %
Brady Statistic AS VS Percent: 0.17 %
Brady Statistic RA Percent Paced: 82.11 %
Brady Statistic RV Percent Paced: 97.1 %
Date Time Interrogation Session: 20170515154024
HighPow Impedance: 37 Ohm
HighPow Impedance: 47 Ohm
Implantable Lead Implant Date: 20040521
Implantable Lead Implant Date: 20071218
Implantable Lead Implant Date: 20160516
Implantable Lead Location: 753858
Implantable Lead Location: 753859
Implantable Lead Location: 753860
Implantable Lead Model: 5076
Implantable Lead Model: 6947
Lead Channel Impedance Value: 285 Ohm
Lead Channel Impedance Value: 361 Ohm
Lead Channel Impedance Value: 361 Ohm
Lead Channel Impedance Value: 399 Ohm
Lead Channel Impedance Value: 456 Ohm
Lead Channel Impedance Value: 456 Ohm
Lead Channel Impedance Value: 456 Ohm
Lead Channel Impedance Value: 646 Ohm
Lead Channel Impedance Value: 665 Ohm
Lead Channel Impedance Value: 722 Ohm
Lead Channel Impedance Value: 760 Ohm
Lead Channel Impedance Value: 779 Ohm
Lead Channel Impedance Value: 817 Ohm
Lead Channel Pacing Threshold Amplitude: 0.5 V
Lead Channel Pacing Threshold Amplitude: 1.625 V
Lead Channel Pacing Threshold Amplitude: 5 V
Lead Channel Pacing Threshold Pulse Width: 0.4 ms
Lead Channel Pacing Threshold Pulse Width: 0.4 ms
Lead Channel Pacing Threshold Pulse Width: 1.5 ms
Lead Channel Sensing Intrinsic Amplitude: 1.375 mV
Lead Channel Sensing Intrinsic Amplitude: 1.375 mV
Lead Channel Setting Pacing Amplitude: 2 V
Lead Channel Setting Pacing Amplitude: 3 V
Lead Channel Setting Pacing Amplitude: 3.25 V
Lead Channel Setting Pacing Pulse Width: 0.4 ms
Lead Channel Setting Pacing Pulse Width: 1 ms
Lead Channel Setting Sensing Sensitivity: 0.3 mV

## 2016-03-03 LAB — CUP PACEART REMOTE DEVICE CHECK
Battery Remaining Longevity: 24 mo
Battery Voltage: 2.93 V
Brady Statistic AP VP Percent: 84.33 %
Brady Statistic AP VS Percent: 0.01 %
Brady Statistic AS VP Percent: 15.58 %
Brady Statistic AS VS Percent: 0.08 %
Brady Statistic RA Percent Paced: 84.34 %
Brady Statistic RV Percent Paced: 98.36 %
Date Time Interrogation Session: 20161212194322
HighPow Impedance: 38 Ohm
HighPow Impedance: 44 Ohm
Implantable Lead Implant Date: 20040521
Implantable Lead Implant Date: 20071218
Implantable Lead Implant Date: 20160516
Implantable Lead Location: 753858
Implantable Lead Location: 753859
Implantable Lead Location: 753860
Implantable Lead Model: 5076
Implantable Lead Model: 6947
Lead Channel Impedance Value: 304 Ohm
Lead Channel Impedance Value: 342 Ohm
Lead Channel Impedance Value: 361 Ohm
Lead Channel Impedance Value: 418 Ohm
Lead Channel Impedance Value: 456 Ohm
Lead Channel Impedance Value: 475 Ohm
Lead Channel Impedance Value: 551 Ohm
Lead Channel Impedance Value: 722 Ohm
Lead Channel Impedance Value: 779 Ohm
Lead Channel Impedance Value: 779 Ohm
Lead Channel Impedance Value: 874 Ohm
Lead Channel Impedance Value: 893 Ohm
Lead Channel Impedance Value: 893 Ohm
Lead Channel Pacing Threshold Amplitude: 0.5 V
Lead Channel Pacing Threshold Amplitude: 1.125 V
Lead Channel Pacing Threshold Amplitude: 4.5 V
Lead Channel Pacing Threshold Pulse Width: 0.4 ms
Lead Channel Pacing Threshold Pulse Width: 0.4 ms
Lead Channel Pacing Threshold Pulse Width: 1.5 ms
Lead Channel Sensing Intrinsic Amplitude: 1.75 mV
Lead Channel Sensing Intrinsic Amplitude: 1.75 mV
Lead Channel Setting Pacing Amplitude: 2 V
Lead Channel Setting Pacing Amplitude: 2.5 V
Lead Channel Setting Pacing Amplitude: 5.5 V
Lead Channel Setting Pacing Pulse Width: 0.4 ms
Lead Channel Setting Pacing Pulse Width: 1.5 ms
Lead Channel Setting Sensing Sensitivity: 0.3 mV

## 2016-03-03 LAB — CUP PACEART INCLINIC DEVICE CHECK
Battery Remaining Longevity: 32 mo
Battery Voltage: 2.92 V
Brady Statistic AP VP Percent: 86.55 %
Brady Statistic AP VS Percent: 0.01 %
Brady Statistic AS VP Percent: 13.43 %
Brady Statistic AS VS Percent: 0.02 %
Brady Statistic RA Percent Paced: 86.56 %
Brady Statistic RV Percent Paced: 99.73 %
Date Time Interrogation Session: 20170213180107
HighPow Impedance: 36 Ohm
HighPow Impedance: 43 Ohm
Implantable Lead Implant Date: 20040521
Implantable Lead Implant Date: 20071218
Implantable Lead Implant Date: 20160516
Implantable Lead Location: 753858
Implantable Lead Location: 753859
Implantable Lead Location: 753860
Implantable Lead Model: 5076
Implantable Lead Model: 6947
Lead Channel Impedance Value: 285 Ohm
Lead Channel Impedance Value: 342 Ohm
Lead Channel Impedance Value: 342 Ohm
Lead Channel Impedance Value: 399 Ohm
Lead Channel Impedance Value: 399 Ohm
Lead Channel Impedance Value: 418 Ohm
Lead Channel Impedance Value: 475 Ohm
Lead Channel Impedance Value: 665 Ohm
Lead Channel Impedance Value: 703 Ohm
Lead Channel Impedance Value: 703 Ohm
Lead Channel Impedance Value: 760 Ohm
Lead Channel Impedance Value: 760 Ohm
Lead Channel Impedance Value: 760 Ohm
Lead Channel Pacing Threshold Amplitude: 0.5 V
Lead Channel Pacing Threshold Amplitude: 1.25 V
Lead Channel Pacing Threshold Amplitude: 2 V
Lead Channel Pacing Threshold Pulse Width: 0.4 ms
Lead Channel Pacing Threshold Pulse Width: 0.8 ms
Lead Channel Pacing Threshold Pulse Width: 1 ms
Lead Channel Sensing Intrinsic Amplitude: 1 mV
Lead Channel Sensing Intrinsic Amplitude: 2.25 mV
Lead Channel Setting Pacing Amplitude: 2 V
Lead Channel Setting Pacing Amplitude: 2.5 V
Lead Channel Setting Pacing Amplitude: 3 V
Lead Channel Setting Pacing Pulse Width: 0.8 ms
Lead Channel Setting Pacing Pulse Width: 1 ms
Lead Channel Setting Sensing Sensitivity: 0.3 mV

## 2016-03-04 IMAGING — US US BREAST LTD UNI LEFT INC AXILLA
1 series · 8 of 8 positions shown · non-contrast
Comparison: Previous exam(s).

CLINICAL DATA: Followup left breast probable intraductal papilloma.
Surgical excision was recommended in the past but not performed due
to the fact that the patient was not felt to be a surgical
candidate.

EXAM:
DIGITAL DIAGNOSTIC BILATERAL MAMMOGRAM WITH 3D TOMOSYNTHESIS WITH
CAD
ULTRASOUND LEFT BREAST

[Series 1: us breast ltd uni left inc axilla · 0.07mm/px · 8 of 8 slices shown]
[im 1/8]
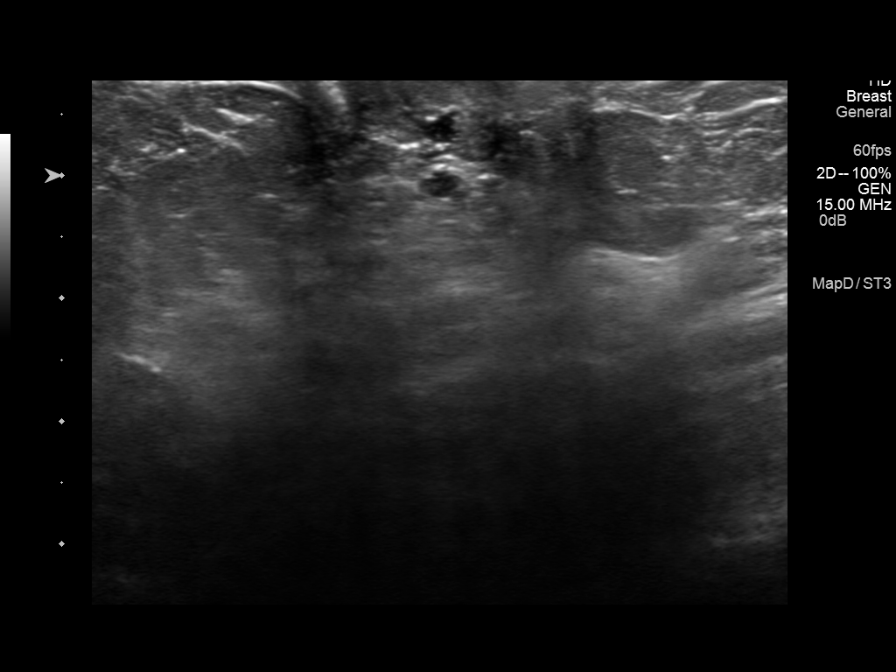
[im 2/8]
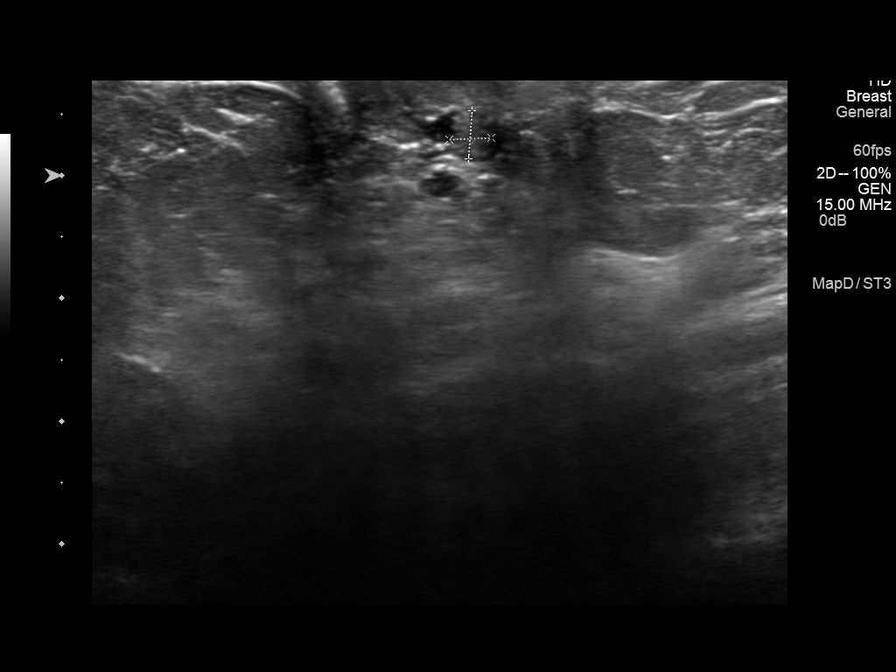
[im 3/8]
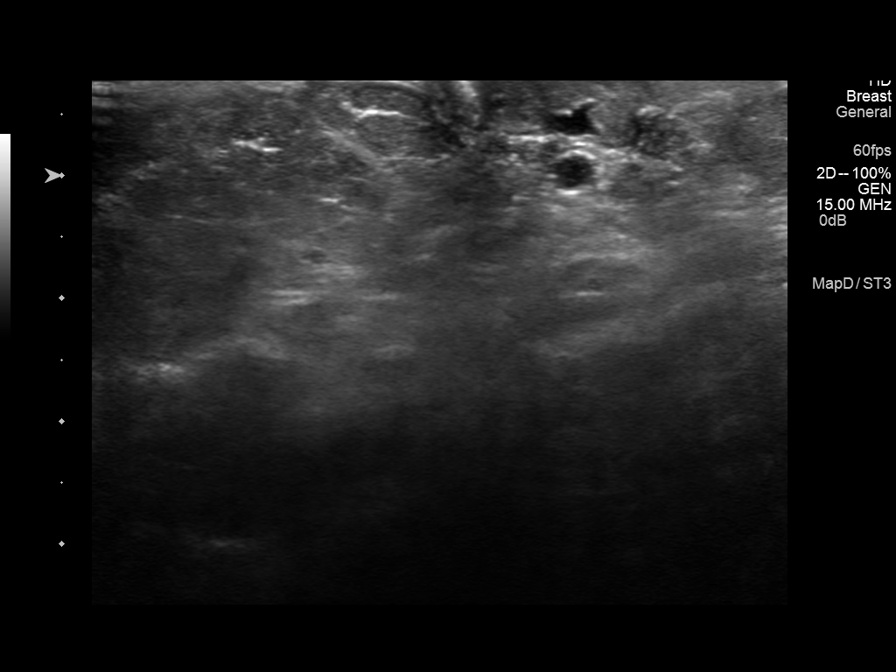
[im 4/8]
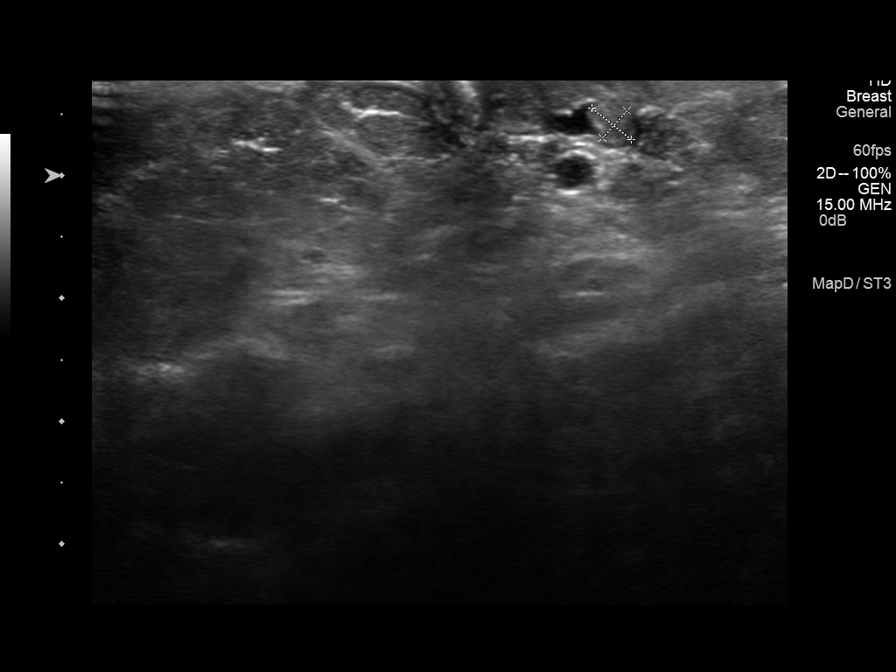
[im 5/8]
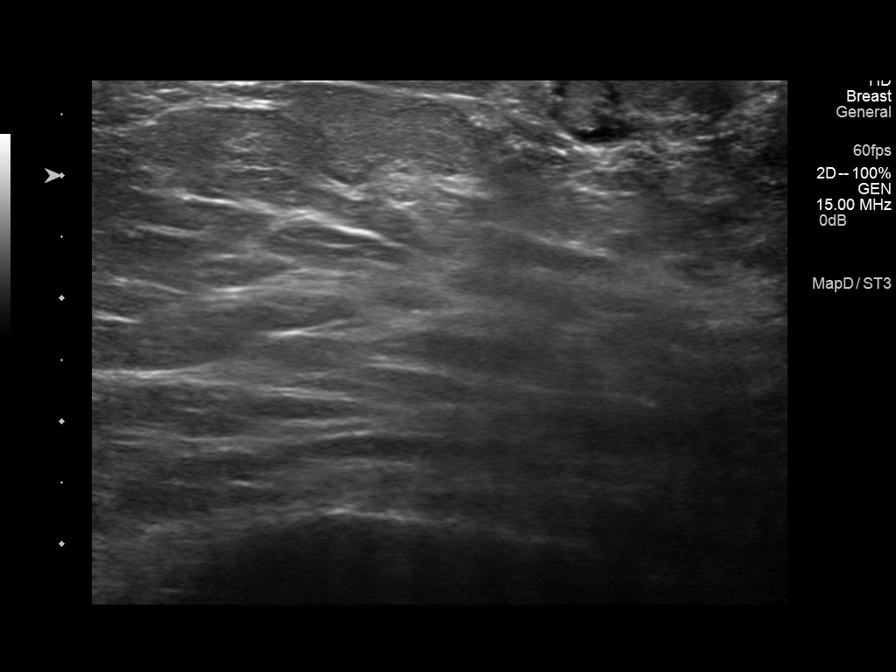
[im 6/8]
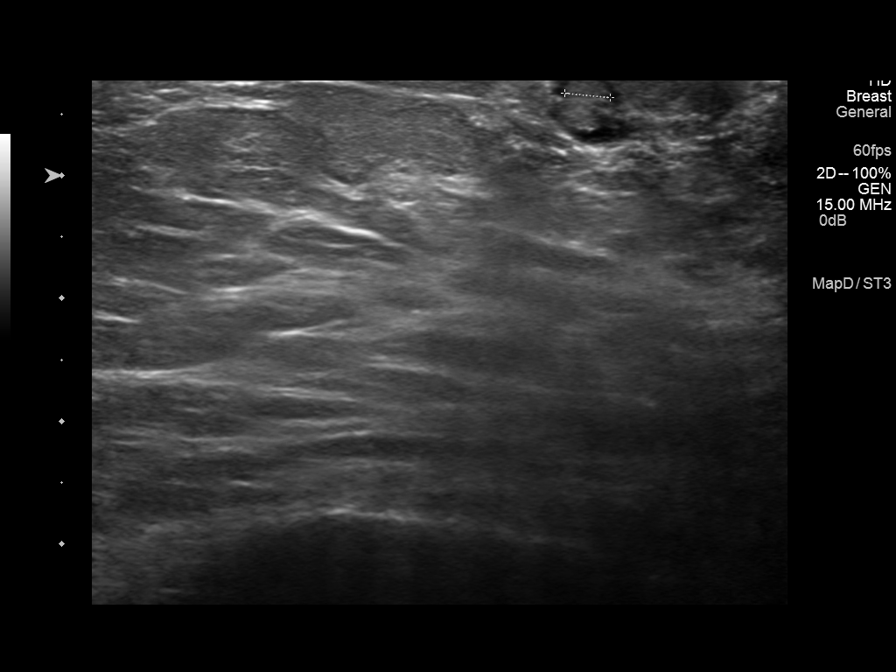
[im 7/8]
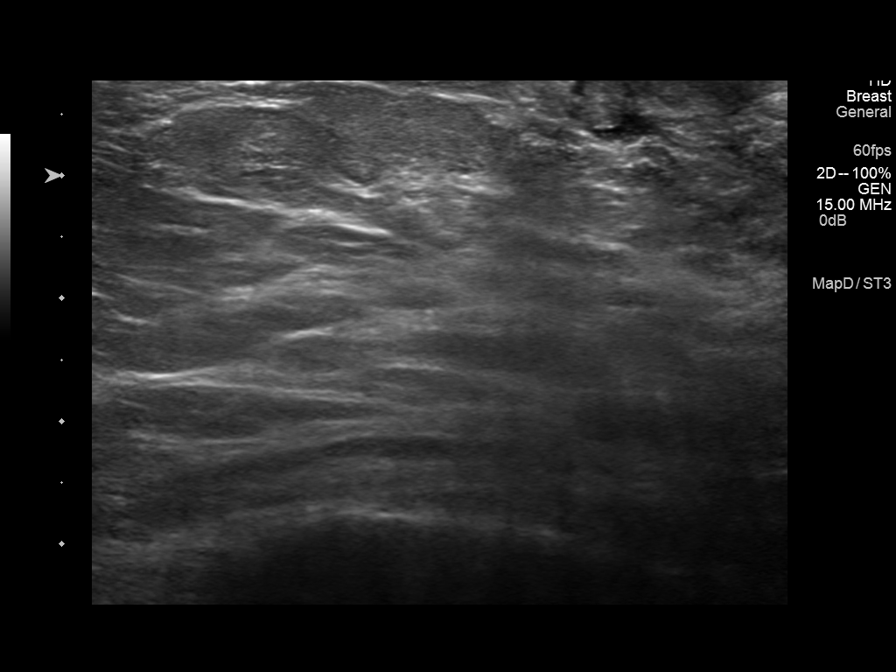
[im 8/8]
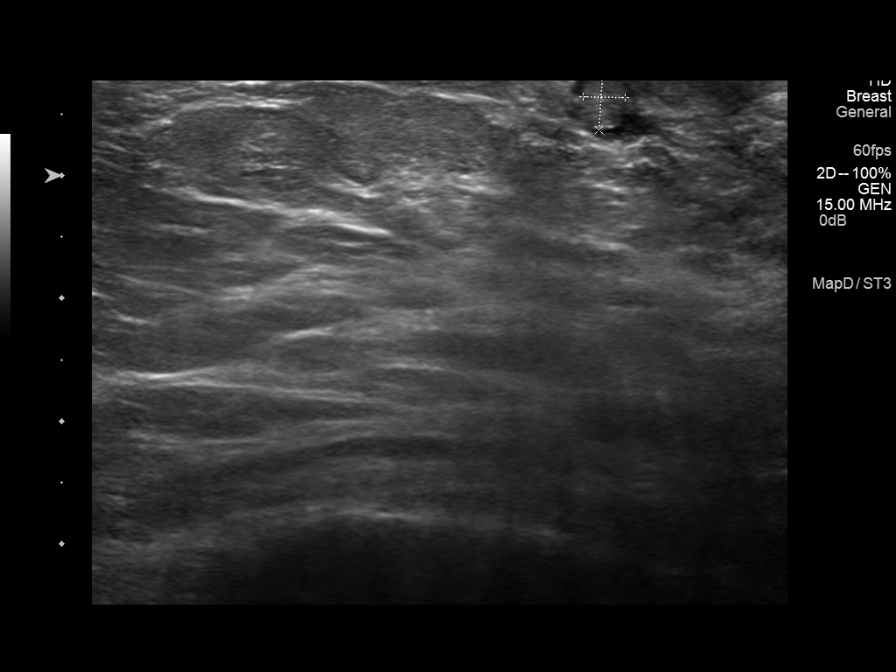

[8 of 8 positions shown; findings below may reference images not displayed]

ACR Breast Density Category c: The breast tissue is heterogeneously
dense, which may obscure small masses.
FINDINGS: Stable mammographic appearance of the breasts with no new findings
suspicious for malignancy in either breast.

Mammographic images were processed with CAD.

On physical exam, no mass is palpable in the retroareolar left
breast.

Targeted ultrasound is performed, showing a 5 x 4 x 3 mm oval,
medium echotexture mass within a focally dilated duct in the retro
nipple region of the left breast. This is slightly smaller than seen
on 02/02/2010, measuring 6 x 5 x 5 mm at that time. This is
unchanged in size compared to the most recent examination dated
03/18/2014.
IMPRESSION: 1. Stable left retronipple probable intraductal papilloma since
03/18/2014, slightly decreased in size since 02/02/2010.
[DATE]. No evidence of malignancy elsewhere in either breast.

RECOMMENDATION:
Bilateral diagnostic mammogram and left breast ultrasound in 1 year.

I have discussed the findings and recommendations with the patient.
Results were also provided in writing at the conclusion of the
visit. If applicable, a reminder letter will be sent to the patient
regarding the next appointment.

BI-RADS CATEGORY  3: Probably benign.

## 2016-03-06 ENCOUNTER — Encounter: Payer: Medicare Other | Admitting: Family Medicine

## 2016-03-06 DIAGNOSIS — M47816 Spondylosis without myelopathy or radiculopathy, lumbar region: Secondary | ICD-10-CM | POA: Diagnosis not present

## 2016-03-08 ENCOUNTER — Telehealth: Payer: Self-pay | Admitting: Family Medicine

## 2016-03-08 ENCOUNTER — Ambulatory Visit (INDEPENDENT_AMBULATORY_CARE_PROVIDER_SITE_OTHER): Payer: Medicare Other | Admitting: *Deleted

## 2016-03-08 ENCOUNTER — Other Ambulatory Visit: Payer: Self-pay

## 2016-03-08 DIAGNOSIS — Z7901 Long term (current) use of anticoagulants: Secondary | ICD-10-CM

## 2016-03-08 DIAGNOSIS — J302 Other seasonal allergic rhinitis: Secondary | ICD-10-CM

## 2016-03-08 DIAGNOSIS — Z954 Presence of other heart-valve replacement: Secondary | ICD-10-CM

## 2016-03-08 DIAGNOSIS — Z5181 Encounter for therapeutic drug level monitoring: Secondary | ICD-10-CM

## 2016-03-08 DIAGNOSIS — Z952 Presence of prosthetic heart valve: Secondary | ICD-10-CM

## 2016-03-08 LAB — POCT INR: INR: 4.3

## 2016-03-08 MED ORDER — COUMADIN 5 MG PO TABS: ORAL_TABLET | ORAL | Status: DC

## 2016-03-08 MED ORDER — PROMETHAZINE-DM 6.25-15 MG/5ML PO SYRP: ORAL_SOLUTION | ORAL | Status: DC

## 2016-03-08 NOTE — Telephone Encounter (Signed)
[name] [name] states that she has been coughing a lot and its worse at night, she cant sleep and she is asking for a refill on cough med Dr. [name] prescribed before 5mg 

## 2016-03-08 NOTE — Telephone Encounter (Signed)
Pt aware.

## 2016-03-08 NOTE — Telephone Encounter (Signed)
Has been coughing a lot, producing yellow mucus, no fever and she can't sleep at night and wants cough med refilled. Please advise

## 2016-03-08 NOTE — Telephone Encounter (Signed)
Refill x 1 please

## 2016-03-09 ENCOUNTER — Encounter (HOSPITAL_COMMUNITY): Payer: Medicare Other | Attending: Internal Medicine

## 2016-03-09 ENCOUNTER — Encounter (HOSPITAL_COMMUNITY): Payer: Medicare Other | Attending: Hematology & Oncology

## 2016-03-09 DIAGNOSIS — E611 Iron deficiency: Secondary | ICD-10-CM | POA: Insufficient documentation

## 2016-03-09 DIAGNOSIS — N184 Chronic kidney disease, stage 4 (severe): Secondary | ICD-10-CM

## 2016-03-09 DIAGNOSIS — D649 Anemia, unspecified: Secondary | ICD-10-CM

## 2016-03-09 DIAGNOSIS — D638 Anemia in other chronic diseases classified elsewhere: Secondary | ICD-10-CM | POA: Insufficient documentation

## 2016-03-09 DIAGNOSIS — N189 Chronic kidney disease, unspecified: Secondary | ICD-10-CM

## 2016-03-09 DIAGNOSIS — D631 Anemia in chronic kidney disease: Secondary | ICD-10-CM

## 2016-03-09 LAB — CBC
HCT: 35.6 % — ABNORMAL LOW (ref 36.0–46.0)
Hemoglobin: 11.4 g/dL — ABNORMAL LOW (ref 12.0–15.0)
MCH: 30.8 pg (ref 26.0–34.0)
MCHC: 32 g/dL (ref 30.0–36.0)
MCV: 96.2 fL (ref 78.0–100.0)
Platelets: 219 10*3/uL (ref 150–400)
RBC: 3.7 MIL/uL — ABNORMAL LOW (ref 3.87–5.11)
RDW: 15.8 % — ABNORMAL HIGH (ref 11.5–15.5)
WBC: 9.1 10*3/uL (ref 4.0–10.5)

## 2016-03-09 NOTE — Progress Notes (Signed)
Injection held today - hgb 11.4

## 2016-03-09 NOTE — Progress Notes (Signed)
prolia not needed today per guidelines Hemoglobin is 11.4

## 2016-03-14 ENCOUNTER — Other Ambulatory Visit (HOSPITAL_COMMUNITY)
Admission: RE | Admit: 2016-03-14 | Discharge: 2016-03-14 | Disposition: A | Discharge status: Home / Self Care | Payer: Medicare Other | Source: Ambulatory Visit | Attending: Family Medicine | Admitting: Family Medicine

## 2016-03-14 ENCOUNTER — Emergency Department (HOSPITAL_COMMUNITY)
Admission: EM | Admit: 2016-03-14 | Discharge: 2016-03-14 | Disposition: A | Discharge status: Home / Self Care | Payer: Medicare Other | Attending: Emergency Medicine | Admitting: Emergency Medicine

## 2016-03-14 ENCOUNTER — Encounter (HOSPITAL_COMMUNITY): Payer: Self-pay | Admitting: Emergency Medicine

## 2016-03-14 ENCOUNTER — Emergency Department (HOSPITAL_COMMUNITY): Payer: Medicare Other

## 2016-03-14 DIAGNOSIS — Z6834 Body mass index (BMI) 34.0-34.9, adult: Secondary | ICD-10-CM | POA: Insufficient documentation

## 2016-03-14 DIAGNOSIS — R319 Hematuria, unspecified: Secondary | ICD-10-CM | POA: Insufficient documentation

## 2016-03-14 DIAGNOSIS — N181 Chronic kidney disease, stage 1: Secondary | ICD-10-CM | POA: Diagnosis not present

## 2016-03-14 DIAGNOSIS — E1122 Type 2 diabetes mellitus with diabetic chronic kidney disease: Secondary | ICD-10-CM | POA: Insufficient documentation

## 2016-03-14 DIAGNOSIS — I509 Heart failure, unspecified: Secondary | ICD-10-CM | POA: Insufficient documentation

## 2016-03-14 DIAGNOSIS — E039 Hypothyroidism, unspecified: Secondary | ICD-10-CM | POA: Diagnosis not present

## 2016-03-14 DIAGNOSIS — E669 Obesity, unspecified: Secondary | ICD-10-CM | POA: Insufficient documentation

## 2016-03-14 DIAGNOSIS — J449 Chronic obstructive pulmonary disease, unspecified: Secondary | ICD-10-CM | POA: Insufficient documentation

## 2016-03-14 DIAGNOSIS — Z79899 Other long term (current) drug therapy: Secondary | ICD-10-CM | POA: Diagnosis not present

## 2016-03-14 DIAGNOSIS — E785 Hyperlipidemia, unspecified: Secondary | ICD-10-CM | POA: Insufficient documentation

## 2016-03-14 DIAGNOSIS — Z87891 Personal history of nicotine dependence: Secondary | ICD-10-CM | POA: Diagnosis not present

## 2016-03-14 DIAGNOSIS — N281 Cyst of kidney, acquired: Secondary | ICD-10-CM | POA: Diagnosis not present

## 2016-03-14 LAB — COMPREHENSIVE METABOLIC PANEL
ALT: 16 U/L (ref 14–54)
ALT: 16 U/L (ref 14–54)
AST: 23 U/L (ref 15–41)
AST: 23 U/L (ref 15–41)
Albumin: 4 g/dL (ref 3.5–5.0)
Albumin: 4 g/dL (ref 3.5–5.0)
Alkaline Phosphatase: 53 U/L (ref 38–126)
Alkaline Phosphatase: 54 U/L (ref 38–126)
Anion gap: 6 (ref 5–15)
Anion gap: 8 (ref 5–15)
BUN: 52 mg/dL — ABNORMAL HIGH (ref 6–20)
BUN: 55 mg/dL — ABNORMAL HIGH (ref 6–20)
CO2: 28 mmol/L (ref 22–32)
CO2: 30 mmol/L (ref 22–32)
Calcium: 9.3 mg/dL (ref 8.9–10.3)
Calcium: 9.3 mg/dL (ref 8.9–10.3)
Chloride: 100 mmol/L — ABNORMAL LOW (ref 101–111)
Chloride: 100 mmol/L — ABNORMAL LOW (ref 101–111)
Creatinine, Ser: 1.54 mg/dL — ABNORMAL HIGH (ref 0.44–1.00)
Creatinine, Ser: 1.54 mg/dL — ABNORMAL HIGH (ref 0.44–1.00)
GFR calc Af Amer: 35 mL/min — ABNORMAL LOW (ref >60)
GFR calc Af Amer: 35 mL/min — ABNORMAL LOW (ref >60)
GFR calc non Af Amer: 30 mL/min — ABNORMAL LOW (ref >60)
GFR calc non Af Amer: 30 mL/min — ABNORMAL LOW (ref >60)
Glucose, Bld: 106 mg/dL — ABNORMAL HIGH (ref 65–99)
Glucose, Bld: 107 mg/dL — ABNORMAL HIGH (ref 65–99)
Potassium: 4.8 mmol/L (ref 3.5–5.1)
Potassium: 4.8 mmol/L (ref 3.5–5.1)
Sodium: 136 mmol/L (ref 135–145)
Sodium: 136 mmol/L (ref 135–145)
Total Bilirubin: 0.6 mg/dL (ref 0.3–1.2)
Total Bilirubin: 0.7 mg/dL (ref 0.3–1.2)
Total Protein: 7.7 g/dL (ref 6.5–8.1)
Total Protein: 7.8 g/dL (ref 6.5–8.1)

## 2016-03-14 LAB — PROTIME-INR
INR: 1.99 — ABNORMAL HIGH (ref 0.00–1.49)
Prothrombin Time: 22.5 seconds — ABNORMAL HIGH (ref 11.6–15.2)

## 2016-03-14 LAB — CBC WITH DIFFERENTIAL/PLATELET
Basophils Absolute: 0 10*3/uL (ref 0.0–0.1)
Basophils Relative: 0 %
Eosinophils Absolute: 0 10*3/uL (ref 0.0–0.7)
Eosinophils Relative: 0 %
HCT: 33.6 % — ABNORMAL LOW (ref 36.0–46.0)
Hemoglobin: 10.9 g/dL — ABNORMAL LOW (ref 12.0–15.0)
Lymphocytes Relative: 12 %
Lymphs Abs: 1.1 10*3/uL (ref 0.7–4.0)
MCH: 31.1 pg (ref 26.0–34.0)
MCHC: 32.4 g/dL (ref 30.0–36.0)
MCV: 95.7 fL (ref 78.0–100.0)
Monocytes Absolute: 0.7 10*3/uL (ref 0.1–1.0)
Monocytes Relative: 7 %
Neutro Abs: 8 10*3/uL — ABNORMAL HIGH (ref 1.7–7.7)
Neutrophils Relative %: 81 %
Platelets: 189 10*3/uL (ref 150–400)
RBC: 3.51 MIL/uL — ABNORMAL LOW (ref 3.87–5.11)
RDW: 15.9 % — ABNORMAL HIGH (ref 11.5–15.5)
WBC: 9.8 10*3/uL (ref 4.0–10.5)

## 2016-03-14 LAB — URINE MICROSCOPIC-ADD ON

## 2016-03-14 LAB — URINALYSIS, ROUTINE W REFLEX MICROSCOPIC
Bilirubin Urine: NEGATIVE
Glucose, UA: NEGATIVE mg/dL
Ketones, ur: NEGATIVE mg/dL
Nitrite: NEGATIVE
Protein, ur: 30 mg/dL — AB
Specific Gravity, Urine: 1.01 (ref 1.005–1.030)
pH: 7 (ref 5.0–8.0)

## 2016-03-14 LAB — TSH: TSH: 0.815 u[IU]/mL (ref 0.350–4.500)

## 2016-03-14 NOTE — ED Notes (Signed)
PT c/o hematuria that started yesterday but denies any pain or frequent urination.

## 2016-03-14 NOTE — Discharge Instructions (Signed)
Hematuria, Adult Hematuria is blood in your urine. It can be caused by a bladder infection, kidney infection, prostate infection, kidney stone, or cancer of your urinary tract. Infections can usually be treated with medicine, and a kidney stone usually will pass through your urine. If neither of these is the cause of your hematuria, further workup to find out the reason may be needed. It is very important that you tell your health care provider about any blood you see in your urine, even if the blood stops without treatment or happens without causing pain. Blood in your urine that happens and then stops and then happens again can be a symptom of a very serious condition. Also, pain is not a symptom in the initial stages of many urinary cancers. HOME CARE INSTRUCTIONS   Drink lots of fluid, 3-4 quarts a day. If you have been diagnosed with an infection, cranberry juice is especially recommended, in addition to large amounts of water.  Avoid caffeine, tea, and carbonated beverages because they tend to irritate the bladder.  Avoid alcohol because it may irritate the prostate.  Take all medicines as directed by your health care provider.  If you were prescribed an antibiotic medicine, finish it all even if you start to feel better.  If you have been diagnosed with a kidney stone, follow your health care provider's instructions regarding straining your urine to catch the stone.  Empty your bladder often. Avoid holding urine for long periods of time.  After a bowel movement, women should cleanse front to back. Use each tissue only once.  Empty your bladder before and after sexual intercourse if you are a female. SEEK MEDICAL CARE IF:  You develop back pain.  You have a fever.  You have a feeling of sickness in your stomach (nausea) or vomiting.  Your symptoms are not better in 3 days. Return sooner if you are getting worse. SEEK IMMEDIATE MEDICAL CARE IF:   You develop severe vomiting and  are unable to keep the medicine down.  You develop severe back or abdominal pain despite taking your medicines.  You begin passing a large amount of blood or clots in your urine.  You feel extremely weak or faint, or you pass out. MAKE SURE YOU:   Understand these instructions.  Will watch your condition.  Will get help right away if you are not doing well or get worse.   This information is not intended to replace advice given to you by your health care provider. Make sure you discuss any questions you have with your health care provider.   Document Released: 09/11/2005 Document Revised: 10/02/2014 Document Reviewed: 05/12/2013 Elsevier Interactive Patient Education 2016 Elsevier Inc.  

## 2016-03-14 NOTE — ED Provider Notes (Signed)
CSN: [id number]     Arrival date & time 03/14/16  1213 History   First MD Initiated Contact with Patient 03/14/16 1411     Chief Complaint  Patient presents with  . Hematuria     HPI Pt noticed blood in her urine yesterday.  It continues today although it isn't quite as dark as it was yesterday.  No dysuria.  No fevers. No abdominal pain.  No back pain. She has not had this trouble before.    She developed a cough recently and has been taking some cold medications but otherwise no changes.   Past Medical History  Diagnosis Date  . DJD (degenerative joint disease) of lumbar spine   . Esophageal reflux   . Other and unspecified hyperlipidemia   . Non-ischemic cardiomyopathy ([place])     a. s/p MDT CRTD  . Obesity, unspecified   . 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 IDA (iron deficiency anemia)     Parenteral iron/[name]  . Adenomatous polyp of colon 12/16/2002    Dr. [name]/[facility]  . Hyperlipemia   . COPD (chronic obstructive pulmonary disease) ([place])        . CHF (congestive heart failure) ([place])        . Constipation        . Gout        . CHB (complete heart block) ([place]) 10/07/2014       . Insomnia        . Unspecified hypothyroidism        . Vertigo        . DM (diabetes mellitus) ([place]) 2006  . Chronic kidney disease, stage I 2006  . Thyroid cancer ([place])     remote thyroidectomy, no recurrence, pt denies in 2016 thyroid cancer  . Oxygen deficiency 2014    nocturnal;  . Heart murmur   . Allergy    Past Surgical History  Procedure Laterality Date  . Thyroidectomy    . Pacemaker placed  2004  . Aortic and mitral valve replacement  2001  . Right breast cyst removed      benign   . Right cataract removed      2005  . Defibrillator implanted 2006    . Cardiac valve replacement    . Cardiac catheterization    . Insert / replace / remove pacemaker    . Esophagogastroduodenoscopy  06/12/2007     Rourk- normal esophagus, small hiatal hernia, otherwise normal stomach, D1, D2  . Colonoscopy  06/12/2007    Rourk- Normal rectum/Normal colon  . Colonoscopy  12/16/02    small hemorrhoids  . Colonoscopy  06/20/2012    Procedure: COLONOSCOPY;  Surgeon: Daneil Dolin, MD;  [facility];  Service: Endoscopy;  Laterality: N/A;  10:45  . Doppler echocardiography  2012  . Doppler echocardiography  05,06,07,08,09,2011  . Bi-ventricular implantable cardioverter defibrillator upgrade N/A 01/18/2015    Procedure: BI-VENTRICULAR IMPLANTABLE CARDIOVERTER DEFIBRILLATOR UPGRADE;  Surgeon: Evans Lance, MD;  Location: [facility] CATH LAB;  Service: Cardiovascular;  Laterality: N/A;  . Icd lead removal Left 02/08/2015    Procedure: ICD LEAD REMOVAL;  Surgeon: Evans Lance, MD;  Location: [facility] OR;  Service: Cardiovascular;  Laterality: Left;  "Will plan extraction and insertion of a BiV PM"  **Dr. Roxan Hockey backing up case**  . Implantable cardioverter defibrillator (icd) generator change Left 02/08/2015    Procedure: ICD GENERATOR CHANGE;  Surgeon: Carleene Overlie  Peyton Najjar, MD;  Location: [facility];  Service: Cardiovascular;  Laterality: Left;  Marland Kitchen Eye surgery Right 2005  . Eye surgery Left 2006  . Pacemaker insertion  May 2016   Family History  Problem Relation Age of Onset  . Cancer Mother     behind pancres  . Heart disease Father   . Heart disease Sister     Heart attack  . Heart disease Brother   . Diabetes Brother   . Heart disease Brother   . Diabetes Brother   . Hypertension Brother    Social History  Substance Use Topics  . Smoking status: Former Smoker -- 0.75 packs/day for 40 years    Types: Cigarettes    Quit date: 03/08/1987  . Smokeless tobacco: Never Used  . Alcohol Use: No   OB History    Gravida Para Term Preterm AB TAB SAB Ectopic Multiple Living            0     Review of Systems    Allergies  Aspirin; Fentanyl; Niacin; and Penicillins  Home Medications   Prior to  Admission medications   Medication Sig Start Date End Date Taking? Authorizing Provider  acetaminophen (TYLENOL) 500 MG tablet Take 500 mg by mouth every 6 (six) hours as needed (pain).   Yes Historical Provider, MD  albuterol (PROVENTIL HFA;VENTOLIN HFA) 108 (90 BASE) MCG/ACT inhaler Inhale 2 puffs into the lungs every 6 (six) hours as needed for wheezing or shortness of breath. 07/29/14  Yes Fayrene Helper, MD  albuterol (PROVENTIL) (2.5 MG/3ML) 0.083% nebulizer solution INHALE 1 VIAL VIA NEBULIZER THREE TIMES DAILY. 02/24/16  Yes Fayrene Helper, MD  allopurinol (ZYLOPRIM) 100 MG tablet Take 1 tablet (100 mg total) by mouth daily. 02/03/16  Yes Fayrene Helper, MD  cholecalciferol (VITAMIN D) 1000 UNITS tablet Take 1,000 Units by mouth daily.   Yes Historical Provider, MD  COUMADIN 5 MG tablet Take 2 tablets daily 03/08/16  Yes Josue Hector, MD  digoxin (LANOXIN) 0.125 MG tablet Take 1 tablet (0.125 mg total) by mouth every other day. 12/22/15  Yes Mihai Croitoru, MD  docusate sodium (COLACE) 100 MG capsule Take 300 mg by mouth at bedtime.   Yes Historical Provider, MD  ezetimibe-simvastatin (VYTORIN) 10-20 MG tablet Take 1 tablet by mouth 3  times weekly 02/03/16  Yes Fayrene Helper, MD  Fluticasone-Salmeterol (ADVAIR DISKUS) 100-50 MCG/DOSE AEPB inhale 1 dose by mouth twice a day 10/21/15  Yes Fayrene Helper, MD  folic acid (FOLVITE) 1 MG tablet Take 1 tablet (1 mg total) by mouth daily. 10/21/15  Yes Fayrene Helper, MD  furosemide (LASIX) 40 MG tablet Take 1 tablet by mouth  daily 10/21/15  Yes Fayrene Helper, MD  gabapentin (NEURONTIN) 300 MG capsule Take 1 capsule (300 mg total) by mouth 2 (two) times daily. 10/21/15  Yes Fayrene Helper, MD  hydrALAZINE (APRESOLINE) 25 MG tablet take 1 tablet by mouth three times a day 09/21/15  Yes Mihai Croitoru, MD  HYDROcodone-acetaminophen (NORCO/VICODIN) 5-325 MG tablet Take 1 tablet by mouth every 4 (four) hours as needed for  moderate pain (Must last 30 days.  Do not take and drive a car or use machinery.). 02/03/16  Yes Sanjuana Kava, MD  isosorbide dinitrate (ISOCHRON) 40 MG CR tablet Take one-half tablet by  mouth twice a day 01/03/16  Yes Troy Sine, MD  losartan (COZAAR) 50 MG tablet Take 1 tablet by mouth  daily 12/13/15  Yes Fayrene Helper, MD  meclizine (ANTIVERT) 12.5 MG tablet Take 1 tablet (12.5 mg total) by mouth 2 (two) times daily as needed for dizziness. 07/08/15  Yes Fayrene Helper, MD  metolazone (ZAROXOLYN) 2.5 MG tablet Take 1 tablet (2.5 mg total) by mouth once a week. 11/09/15  Yes Fayrene Helper, MD  ondansetron (ZOFRAN) 4 MG tablet Take 1 tablet (4 mg total) by mouth daily. 11/17/15  Yes Fayrene Helper, MD  Polyethyl [name] (SYSTANE OP) Place 1 drop into both eyes daily as needed (dry eyes).   Yes Historical Provider, MD  potassium chloride SA (KLOR-CON M20) 20 MEQ tablet Take 1 tablet by mouth  daily 11/03/15  Yes Fayrene Helper, MD  PROAIR HFA 108 (90 BASE) MCG/ACT inhaler USE 2 PUFFS INTO THE LUNGS EVERY 6 HOURS AS NEEDED FOR WHEEZING OR SHORTNESS OF BREATH. 09/01/15  Yes Fayrene Helper, MD  promethazine-dextromethorphan (PROMETHAZINE-DM) 6.25-15 MG/5ML syrup One teaspoon at bedtime as needed, for cough 03/08/16  Yes Fayrene Helper, MD  SYNTHROID 75 MCG tablet Take 1 tablet by mouth  daily 11/03/15  Yes Fayrene Helper, MD  temazepam (RESTORIL) 30 MG capsule take 1 capsule at bedtime if needed 10/21/15  Yes Fayrene Helper, MD  TOPROL XL 100 MG 24 hr tablet Take 1 tablet (100 mg total) by mouth daily. Take with or immediately following a meal. 01/03/16  Yes Fayrene Helper, MD  azelastine (ASTELIN) 0.1 % nasal spray Place 2 sprays into both nostrils 2 (two) times daily. Use in each nostril as directed Patient not taking: Reported on 03/14/2016 10/04/15   Fayrene Helper, MD  benzonatate (TESSALON) 100 MG capsule Take 1 capsule (100 mg total) by mouth 2  (two) times daily as needed for cough. Patient not taking: Reported on 01/18/2016 11/17/15   Fayrene Helper, MD  Digoxin (LANOXIN) 62.5 MCG TABS Take 1 tablet by mouth  every day Patient not taking: Reported on 01/18/2016 12/22/15   Fayrene Helper, MD  Diphenhyd-Hydrocort-Nystatin (FIRST-DUKES MOUTHWASH) SUSP 2 tsp three times daily, gargle, swish and swallow for 3 days, then as needed, up to 1 week total Patient not taking: Reported on 03/14/2016 11/24/15   Fayrene Helper, MD  fluticasone [facility]) 50 MCG/ACT nasal spray instill 2 sprays into each nostril once daily Patient not taking: Reported on 03/14/2016 12/22/14   Fayrene Helper, MD  montelukast (SINGULAIR) 10 MG tablet Take 1 tablet (10 mg total) by mouth at bedtime. Patient not taking: Reported on 03/14/2016 10/21/15   Fayrene Helper, MD   BP 130/72 mmHg  Pulse 69  Temp(Src) 98 F (36.7 C) (Temporal)  Resp 18  Ht 5\' 7"  (1.702 m)  Wt 100.699 kg  BMI 34.76 kg/m2  SpO2 95% Physical Exam  Constitutional: No distress.  HENT:  Head: Normocephalic and atraumatic.  Right Ear: External ear normal.  Left Ear: External ear normal.  Eyes: Conjunctivae are normal. Right eye exhibits no discharge. Left eye exhibits no discharge. No scleral icterus.  Neck: Neck supple. No tracheal deviation present.  Cardiovascular: Normal rate, regular rhythm and intact distal pulses.   Pulmonary/Chest: Effort normal and breath sounds normal. No stridor. No respiratory distress. She has no wheezes. She has no rales.  Abdominal: Soft. Bowel sounds are normal. She exhibits no distension. There is no tenderness. There is no rebound and no guarding.  Musculoskeletal: She exhibits no edema or tenderness.  Neurological: She is alert. She has normal strength.  No cranial nerve deficit (no facial droop, extraocular movements intact, no slurred speech) or sensory deficit. She exhibits normal muscle tone. She displays no seizure activity. Coordination normal.   Skin: Skin is warm and dry. No rash noted.  Psychiatric: She has a normal mood and affect.  Nursing note and vitals reviewed.   ED Course  Procedures (including critical care time) Labs Review Labs Reviewed  URINALYSIS, ROUTINE W REFLEX MICROSCOPIC (NOT AT [facility]) - Abnormal; Notable for the following:    Color, Urine RED (*)    APPearance HAZY (*)    Hgb urine dipstick LARGE (*)    Protein, ur 30 (*)    Leukocytes, UA TRACE (*)    All other components within normal limits  COMPREHENSIVE METABOLIC PANEL - Abnormal; Notable for the following:    Chloride 100 (*)    Glucose, Bld 107 (*)    BUN 52 (*)    Creatinine, Ser 1.54 (*)    GFR calc non Af Amer 30 (*)    GFR calc Af Amer 35 (*)    All other components within normal limits  CBC WITH DIFFERENTIAL/PLATELET - Abnormal; Notable for the following:    RBC 3.51 (*)    Hemoglobin 10.9 (*)    HCT 33.6 (*)    RDW 15.9 (*)    Neutro Abs 8.0 (*)    All other components within normal limits  PROTIME-INR - Abnormal; Notable for the following:    Prothrombin Time 22.5 (*)    INR 1.99 (*)    All other components within normal limits  URINE MICROSCOPIC-ADD ON - Abnormal; Notable for the following:    Squamous Epithelial / LPF 0-5 (*)    Bacteria, UA RARE (*)    All other components within normal limits  URINE CULTURE    Imaging Review Ct Renal Stone Study  03/14/2016  CLINICAL DATA:  Hematuria, right flank pain starting yesterday EXAM: CT ABDOMEN AND PELVIS WITHOUT CONTRAST TECHNIQUE: Multidetector CT imaging of the abdomen and pelvis was performed following the standard protocol without IV contrast. COMPARISON:  01/27/2008 and lumbar spine CT 12/03/2015. FINDINGS: Lower chest: Atherosclerotic calcifications of thoracic aorta. Small hiatal hernia. Mild elevation of the left hemidiaphragm. Partially visualized cardiac pacemaker leads. Postsurgical changes post mitral valve repair. Hepatobiliary: Unenhanced liver shows no biliary ductal  dilatation. Small layering gallstones and gallbladder sludge noted within gallbladder. The largest gallstone measures 4.5 mm. Pancreas: Unenhanced pancreas is unremarkable. Spleen: Stable complex partially calcified cystic lesions within spleen. The largest anteriorly axial image 22 measures 7.7 cm without significant change from prior exam. Adrenals/Urinary Tract: No adrenal gland mass is noted. Unenhanced kidneys shows no nephrolithiasis. There is a cyst in midpole posterior aspect of the left kidney measures 3.6 cm. Stable from prior exam. Tiny exophytic cyst is noted in lower pole of the right kidney measures 8 mm. There is a cyst in midpole of the right kidney measures 9 mm. No nephrolithiasis. No hydronephrosis or hydroureter. No calcified ureteral calculi are noted. Stomach/Bowel: There is no gastric outlet obstruction. No small bowel obstruction. Axial image 61 there is probable a small bowel lipoma in right lower anterior pelvis measures 3.1 cm without evidence of acute complication or small bowel obstruction. There is moderate stool are noted within cecum. No pericecal inflammation. Normal appendix is noted in axial image 50. Moderate stool noted in right colon. No colonic obstruction. There is no evidence of colitis or diverticulitis. Moderate gas and stool noted within rectum. Vascular/Lymphatic: Extensive atherosclerotic  calcifications of abdominal aorta and iliac arteries. SMA calcifications are noted. Splenic artery calcifications are noted. Atherosclerotic calcifications are noted bilateral renal artery origin. No retroperitoneal or mesenteric adenopathy. Reproductive: The uterus is atrophic. Probable small calcified uterine fibroids. No adnexal masses noted. The urinary bladder is under distended grossly unremarkable. No calcified calculi are noted within urinary bladder. Pelvic phleboliths are noted. Other: There is no ascites or free abdominal air. Musculoskeletal: Sagittal images of the spine  shows diffuse osteopenia. Significant disc space flattening with vacuum disc phenomenon at L1-L2 level. Significant disc space flattening with vacuum disc phenomenon endplate sclerotic changes at L2-L3 level. There is mild compression deformity upper endplate of L5 vertebral body. This is stable from 12/03/2015. IMPRESSION: 1. There is no evidence of nephrolithiasis. No hydronephrosis or hydroureter. 2. Bilateral renal cysts. 3. No calcified ureteral calculi are noted bilaterally. 4. Stable complex partially calcified cystic lesions within spleen. 5. Extensive atherosclerotic vascular calcifications as described above. 6. Moderate stool noted within cecum. No pericecal inflammation. Normal appendix. 7. Probable small bowel lipoma in right lower quadrant measures about 3.1 cm without evidence of acute complication. No evidence of small bowel obstruction. 8. Atrophic uterus. Probable small calcified fibroids within uterus. No adnexal mass. 9. Moderate stool and gas noted within rectum. 10. Degenerative changes and osteopenia lumbar spine. Electronically Signed   By: Lahoma Crocker M.D.   On: 03/14/2016 17:04   I have personally reviewed and evaluated these images and lab results as part of my medical decision-making.    MDM   Final diagnoses:  Hematuria    Patient's laboratory tests are notable for hematuria. Urinalysis is not suggestive of a urinary tract infection. Patient has a stable anemia and her INR is therapeutic. She does have renal insufficiency that is  better than previous values.  CT scan does not show any acute findings to account for her hematuria.  the patient is stable to follow up with a urologist as an outpatient for further evaluation   Dorie Rank, MD 03/14/16 1752

## 2016-03-15 LAB — HEMOGLOBIN A1C
Hgb A1c MFr Bld: 5.9 % — ABNORMAL HIGH (ref 4.8–5.6)
Mean Plasma Glucose: 123 mg/dL

## 2016-03-16 DIAGNOSIS — E559 Vitamin D deficiency, unspecified: Secondary | ICD-10-CM | POA: Diagnosis not present

## 2016-03-16 DIAGNOSIS — Z79899 Other long term (current) drug therapy: Secondary | ICD-10-CM | POA: Diagnosis not present

## 2016-03-16 DIAGNOSIS — N183 Chronic kidney disease, stage 3 (moderate): Secondary | ICD-10-CM | POA: Diagnosis not present

## 2016-03-16 DIAGNOSIS — D509 Iron deficiency anemia, unspecified: Secondary | ICD-10-CM | POA: Diagnosis not present

## 2016-03-16 DIAGNOSIS — I10 Essential (primary) hypertension: Secondary | ICD-10-CM | POA: Diagnosis not present

## 2016-03-16 DIAGNOSIS — R809 Proteinuria, unspecified: Secondary | ICD-10-CM | POA: Diagnosis not present

## 2016-03-16 LAB — URINE CULTURE

## 2016-03-20 ENCOUNTER — Ambulatory Visit (HOSPITAL_COMMUNITY)
Admission: RE | Admit: 2016-03-20 | Discharge: 2016-03-20 | Disposition: A | Discharge status: Home / Self Care | Payer: Medicare Other | Source: Ambulatory Visit | Attending: Family Medicine | Admitting: Family Medicine

## 2016-03-20 ENCOUNTER — Ambulatory Visit (INDEPENDENT_AMBULATORY_CARE_PROVIDER_SITE_OTHER): Payer: Medicare Other | Admitting: Family Medicine

## 2016-03-20 ENCOUNTER — Encounter: Payer: Self-pay | Admitting: Family Medicine

## 2016-03-20 VITALS — BP 104/60 | HR 54 | Resp 16 | Ht 67.0 in | Wt 225.0 lb

## 2016-03-20 DIAGNOSIS — J302 Other seasonal allergic rhinitis: Secondary | ICD-10-CM | POA: Diagnosis not present

## 2016-03-20 DIAGNOSIS — E890 Postprocedural hypothyroidism: Secondary | ICD-10-CM

## 2016-03-20 DIAGNOSIS — R319 Hematuria, unspecified: Secondary | ICD-10-CM

## 2016-03-20 DIAGNOSIS — R05 Cough: Secondary | ICD-10-CM | POA: Diagnosis not present

## 2016-03-20 DIAGNOSIS — E785 Hyperlipidemia, unspecified: Secondary | ICD-10-CM

## 2016-03-20 DIAGNOSIS — J3089 Other allergic rhinitis: Secondary | ICD-10-CM | POA: Diagnosis not present

## 2016-03-20 DIAGNOSIS — I700 Atherosclerosis of aorta: Secondary | ICD-10-CM | POA: Insufficient documentation

## 2016-03-20 DIAGNOSIS — J449 Chronic obstructive pulmonary disease, unspecified: Secondary | ICD-10-CM

## 2016-03-20 DIAGNOSIS — Z0000 Encounter for general adult medical examination without abnormal findings: Secondary | ICD-10-CM

## 2016-03-20 DIAGNOSIS — R059 Cough, unspecified: Secondary | ICD-10-CM

## 2016-03-20 DIAGNOSIS — R0602 Shortness of breath: Secondary | ICD-10-CM | POA: Diagnosis not present

## 2016-03-20 DIAGNOSIS — E1122 Type 2 diabetes mellitus with diabetic chronic kidney disease: Secondary | ICD-10-CM

## 2016-03-20 DIAGNOSIS — I10 Essential (primary) hypertension: Secondary | ICD-10-CM

## 2016-03-20 DIAGNOSIS — I509 Heart failure, unspecified: Secondary | ICD-10-CM | POA: Insufficient documentation

## 2016-03-20 DIAGNOSIS — Z950 Presence of cardiac pacemaker: Secondary | ICD-10-CM | POA: Diagnosis not present

## 2016-03-20 DIAGNOSIS — E790 Hyperuricemia without signs of inflammatory arthritis and tophaceous disease: Secondary | ICD-10-CM

## 2016-03-20 MED ORDER — MONTELUKAST SODIUM 10 MG PO TABS: 10.0000 mg | ORAL_TABLET | Freq: Every day | ORAL | Status: DC

## 2016-03-20 MED ORDER — BENZONATATE 100 MG PO CAPS: 100.0000 mg | ORAL_CAPSULE | Freq: Two times a day (BID) | ORAL | Status: DC | PRN

## 2016-03-20 MED ORDER — FLUTICASONE-SALMETEROL 100-50 MCG/DOSE IN AEPB: INHALATION_SPRAY | RESPIRATORY_TRACT | Status: DC

## 2016-03-20 NOTE — Assessment & Plan Note (Signed)
Painless hematuria in pt on chronic coumadin, not supra therapeutic, urology to evaluate

## 2016-03-20 NOTE — Assessment & Plan Note (Signed)

## 2016-03-20 NOTE — Progress Notes (Signed)
Subjective:    Patient ID: [name] [name], female    DOB: 12-15-1931, 82 y.o.   MRN: [id number]  HPI Preventive Screening-Counseling & Management   Patient present here today for a Medicare annual wellness visit. Presented with frank hematuria to the ED 1 week ago, first episode, not over anti coagulated, will refer to urology for further eval as deemed necessary C/o chronic cough worse at night, states she has advair and uses it tho not on current med list, not taking singulair prescribed  Also, will refer fore pulmonary eval, she is specifically which meds to take for her cough and is being referred for lung function testing  Current Problems (verified)   Medications Prior to Visit Allergies (verified)   PAST HISTORY  Family History (verified)  Social History widow, no children, helped raise 2 children after mother passed. Lives with her brother currently    Risk Factors  Current exercise habits:  No exercises currently. Will give information on chair exercises   Dietary issues discussed: heart healthy diet encouraged. Limiting fried fatty foods and carbs    Cardiac risk factors: type 2 dm, father had heart disease and brother passed with an MI   Depression Screen  (Note: if answer to either of the following is "Yes", a more complete depression screening is indicated)   Over the past two weeks, have you felt down, depressed or hopeless? No  Over the past two weeks, have you felt little interest or pleasure in doing things? No  Have you lost interest or pleasure in daily life? No  Do you often feel hopeless? No  Do you cry easily over simple problems? No   Activities of Daily Living  In your present state of health, do you have any difficulty performing the following activities?  Driving?: doesn't drive Managing money?: No Feeding yourself?:No Getting from bed to chair?:uses cane Climbing a flight of stairs?: very limited, can do a few if she moves slowly Preparing  food and eating?:prepares food now herself but states its getting difficult  Bathing or showering?:No Getting dressed?:No Getting to the toilet?:uses cane Using the toilet?:No Moving around from place to place?: has to use cane for stability   Fall Risk Assessment In the past year have you fallen or had a near fall?:No Are you currently taking any medications that make you dizzy?:No   Hearing Difficulties: No Do you often ask people to speak up or repeat themselves?:No Do you experience ringing or noises in your ears?:No Do you have difficulty understanding soft or whispered voices?:No  Cognitive Testing  Alert? Yes Normal Appearance?Yes  Oriented to person? Yes Place? Yes  Time? Yes  Displays appropriate judgment?Yes  Can read the correct time from a watch face? yes Are you having problems remembering things? Sometimes she will forget small things but eventually remembers   Advanced Directives have been discussed with the patient?Yes, full code, brochure for advanced directives given     List the Names of Other Physician/Practitioners you currently use: (updated)   Indicate any recent Medical Services you may have received from other than [facility] providers in the past year (date may be approximate).   Assessment:    Annual Wellness Exam   Plan:      Medicare Attestation  I have personally reviewed:  The patient's medical and social history  Their use of alcohol, tobacco or illicit drugs  Their current medications and supplements  The patient's functional ability including ADLs,fall risks, home safety risks, cognitive, and hearing  and visual impairment  Diet and physical activities  Evidence for depression or mood disorders  The patient's weight, height, BMI, and visual acuity have been recorded in the chart. I have made referrals, counseling, and provided education to the patient based on review of the above and I have provided the patient with a written personalized  care plan for preventive services.     Review of Systems     Objective:   Physical Exam BP 104/60 mmHg  Pulse 54  Resp 16  Ht 5\' 7"  (1.702 m)  Wt 225 lb (102.059 kg)  BMI 35.23 kg/m2  SpO2 90% Chest: adequate air entry bilaterally , no crackles or wheezes      Assessment & Plan:  Medicare annual wellness visit, subsequent Annual exam as documented. Counseling done  re healthy lifestyle involving commitment to 150 minutes exercise per week, heart healthy diet, and attaining healthy weight.The importance of adequate sleep also discussed. Regular seat belt use and home safety, is also discussed. Changes in health habits are decided on by the patient with goals and time frames  set for achieving them. Immunization and cancer screening needs are specifically addressed at this visit.   COPD (chronic obstructive pulmonary disease) Uncontrolled with chronic cough, re educated and med sent for [employer], advair and singulair. Refer for PFT and to pulmonary  Hematuria Painless hematuria in pt on chronic coumadin, not supra therapeutic, urology to evaluate

## 2016-03-20 NOTE — Assessment & Plan Note (Signed)
Uncontrolled with chronic cough, re educated and med sent for [employer], advair and singulair. Refer for PFT and to pulmonary

## 2016-03-20 NOTE — Patient Instructions (Addendum)
F/u with medication in 3.5 month, call if you need me sooner  CXR today and you are referred for lung function test and to see Dr Luan Pulling  For chronic cough, you are referred to Dr Luan Pulling and f9or a lung function test  Start tessalon perles , singulair and continue   advair  You are also referred to urology re blood in urine, which may well be because of the coumadin  Do Not use sleeping pill when you take cough syrup at night  Fasting lipid, cmp and EGFR, hBA1C, uric acid, TSH  In 3.5 months

## 2016-03-22 ENCOUNTER — Other Ambulatory Visit: Payer: Self-pay

## 2016-03-22 ENCOUNTER — Telehealth: Payer: Self-pay | Admitting: Family Medicine

## 2016-03-22 DIAGNOSIS — J449 Chronic obstructive pulmonary disease, unspecified: Secondary | ICD-10-CM

## 2016-03-22 DIAGNOSIS — R809 Proteinuria, unspecified: Secondary | ICD-10-CM | POA: Diagnosis not present

## 2016-03-22 DIAGNOSIS — N184 Chronic kidney disease, stage 4 (severe): Secondary | ICD-10-CM | POA: Diagnosis not present

## 2016-03-22 DIAGNOSIS — I10 Essential (primary) hypertension: Secondary | ICD-10-CM | POA: Diagnosis not present

## 2016-03-22 DIAGNOSIS — E1129 Type 2 diabetes mellitus with other diabetic kidney complication: Secondary | ICD-10-CM | POA: Diagnosis not present

## 2016-03-22 MED ORDER — FLUTICASONE-SALMETEROL 100-50 MCG/DOSE IN AEPB: INHALATION_SPRAY | RESPIRATORY_TRACT | Status: DC

## 2016-03-22 NOTE — Telephone Encounter (Signed)
Patient aware.

## 2016-03-22 NOTE — Telephone Encounter (Signed)
[name] [name] is asking for xray results, please advise?

## 2016-03-23 DIAGNOSIS — M47816 Spondylosis without myelopathy or radiculopathy, lumbar region: Secondary | ICD-10-CM | POA: Diagnosis not present

## 2016-03-24 ENCOUNTER — Encounter: Payer: Self-pay | Admitting: Internal Medicine

## 2016-03-24 ENCOUNTER — Encounter: Payer: Medicare Other | Admitting: Family Medicine

## 2016-03-24 ENCOUNTER — Ambulatory Visit (INDEPENDENT_AMBULATORY_CARE_PROVIDER_SITE_OTHER): Payer: Medicare Other | Admitting: Internal Medicine

## 2016-03-24 VITALS — BP 155/76 | HR 85 | Ht 67.0 in | Wt 223.0 lb

## 2016-03-24 DIAGNOSIS — I428 Other cardiomyopathies: Secondary | ICD-10-CM

## 2016-03-24 DIAGNOSIS — I429 Cardiomyopathy, unspecified: Secondary | ICD-10-CM | POA: Diagnosis not present

## 2016-03-24 DIAGNOSIS — I5022 Chronic systolic (congestive) heart failure: Secondary | ICD-10-CM

## 2016-03-24 LAB — CUP PACEART INCLINIC DEVICE CHECK
Date Time Interrogation Session: 20171006162822
Implantable Lead Implant Date: 20040521
Implantable Lead Implant Date: 20071218
Implantable Lead Implant Date: 20160516
Implantable Lead Location: 753858
Implantable Lead Location: 753859
Implantable Lead Location: 753860
Implantable Lead Model: 5076
Implantable Lead Model: 6947
Lead Channel Pacing Threshold Amplitude: 0.5 V
Lead Channel Pacing Threshold Amplitude: 1 V
Lead Channel Pacing Threshold Amplitude: 1.25 V
Lead Channel Pacing Threshold Pulse Width: 0.4 ms
Lead Channel Pacing Threshold Pulse Width: 0.6 ms
Lead Channel Pacing Threshold Pulse Width: 1 ms
Lead Channel Sensing Intrinsic Amplitude: 1.6 mV
Lead Channel Setting Pacing Amplitude: 2 V
Lead Channel Setting Pacing Amplitude: 3 V
Lead Channel Setting Pacing Amplitude: 3.25 V
Lead Channel Setting Pacing Pulse Width: 0.4 ms
Lead Channel Setting Pacing Pulse Width: 1 ms
Lead Channel Setting Sensing Sensitivity: 0.3 mV

## 2016-03-24 NOTE — Patient Instructions (Signed)
Your physician wants you to follow-up in: 1 Year with Dr. Lovena Le. You will receive a reminder letter in the mail two months in advance. If you don't receive a letter, please call our office to schedule the follow-up appointment.  Remote monitoring is used to monitor your Pacemaker of ICD from home. This monitoring reduces the number of office visits required to check your device to one time per year. It allows [place] to keep an eye on the functioning of your device to ensure it is working properly. You are scheduled for a device check from home on 06/26/16. You may send your transmission at any time that day. If you have a wireless device, the transmission will be sent automatically. After your physician reviews your transmission, you will receive a postcard with your next transmission date.  Your physician recommends that you continue on your current medications as directed. Please refer to the Current Medication list given to you today.  If you need a refill on your cardiac medications before your next appointment, please call your pharmacy.  Thank you for choosing [facility]!

## 2016-03-24 NOTE — Progress Notes (Signed)
HPI [name] returns today for followup. 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 Her old ICD lead had broken. In the interim, She has been stable. She has class 2 CHF symptoms. She has been exercising some. No edema.  Allergies  Allergen Reactions  . Aspirin Other (See Comments)    On coumadin  . Fentanyl Dermatitis    Rash with patch   . Niacin Itching  . Penicillins Other (See Comments)    Bruises  Has patient had a PCN reaction causing immediate rash, facial/tongue/throat swelling, SOB or lightheadedness with hypotension: Nono Has patient had a PCN reaction causing severe rash involving mucus membranes or skin necrosis: Nono Has patient had a PCN reaction that required hospitalization Nono Has patient had a PCN reaction occurring within the last 10 years: {Yes/No:no If all of the above answers are "NO", then may proceed with Cephalosporin      Current Outpatient Prescriptions  Medication Sig Dispense Refill  . acetaminophen (TYLENOL) 500 MG tablet Take 500 mg by mouth every 6 (six) hours as needed (pain).    Marland Kitchen albuterol (PROVENTIL HFA;VENTOLIN HFA) 108 (90 BASE) MCG/ACT inhaler Inhale 2 puffs into the lungs every 6 (six) hours as needed for wheezing or shortness of breath. 1 Inhaler 3  . albuterol (PROVENTIL) (2.5 MG/3ML) 0.083% nebulizer solution INHALE 1 VIAL VIA NEBULIZER THREE TIMES DAILY. 300 mL 2  . allopurinol (ZYLOPRIM) 100 MG tablet Take 1 tablet (100 mg total) by mouth daily. 90 tablet 1  . azelastine (ASTELIN) 0.1 % nasal spray Place 2 sprays into both nostrils 2 (two) times daily. Use in each nostril as directed 30 mL 12  . benzonatate (TESSALON) 100 MG capsule Take 1 capsule (100 mg total) by mouth 2 (two) times daily as needed for cough.  20 capsule 0  . cholecalciferol (VITAMIN D) 1000 UNITS tablet Take 1,000 Units by mouth daily.    Marland Kitchen COUMADIN 5 MG tablet Take 2 tablets daily 180 tablet 3  . digoxin (LANOXIN) 0.125 MG tablet Take 1 tablet (0.125 mg total) by mouth every other day. 90 tablet 3  . Digoxin (LANOXIN) 62.5 MCG TABS Take 1 tablet by mouth  every day 30 tablet 2  . Diphenhyd-Hydrocort-Nystatin (FIRST-DUKES MOUTHWASH) SUSP 2 tsp three times daily, gargle, swish and swallow for 3 days, then as needed, up to 1 week total 237 mL 0  . docusate sodium (COLACE) 100 MG capsule Take 300 mg by mouth at bedtime.    Marland Kitchen ezetimibe-simvastatin (VYTORIN) 10-20 MG tablet Take 1 tablet by mouth 3  times weekly 7 tablet 0  . fluticasone (FLONASE) 50 MCG/ACT nasal spray instill 2 sprays into each nostril once daily 16 g 2  . Fluticasone-Salmeterol (ADVAIR DISKUS) 100-50 MCG/DOSE AEPB inhale 1 dose by mouth twice a day 99991111 each 3  . folic acid (FOLVITE) 1 MG tablet Take 1 tablet (1 mg total) by mouth daily. 100 tablet 1  . furosemide (LASIX) 40 MG tablet Take 1 tablet by mouth  daily 90 tablet 1  . gabapentin (NEURONTIN) 300 MG capsule Take 1 capsule (300 mg total) by mouth 2 (two) times daily. 180 capsule 1  . hydrALAZINE (APRESOLINE) 25 MG tablet take 1 tablet by mouth three times a day 270  tablet 3  . HYDROcodone-acetaminophen (NORCO/VICODIN) 5-325 MG tablet Take 1 tablet by mouth every 4 (four) hours as needed for moderate pain (Must last 30 days.  Do not take and drive a car or use machinery.). 120 tablet 0  . isosorbide dinitrate (ISOCHRON) 40 MG CR tablet Take one-half tablet by  mouth twice a day 90 tablet 0  . losartan (COZAAR) 50 MG tablet Take 1 tablet by mouth  daily 90 tablet 1  . meclizine (ANTIVERT) 12.5 MG tablet Take 1 tablet (12.5 mg total) by mouth 2 (two) times daily as needed for dizziness. 30 tablet 2  . metolazone (ZAROXOLYN) 2.5 MG tablet Take 1 tablet (2.5 mg total) by mouth once a week. 2 tablet 0  . montelukast  (SINGULAIR) 10 MG tablet Take 1 tablet (10 mg total) by mouth at bedtime. 90 tablet 1  . ondansetron (ZOFRAN) 4 MG tablet Take 1 tablet (4 mg total) by mouth daily. 90 tablet 0  . Polyethyl [name] (SYSTANE OP) Place 1 drop into both eyes daily as needed (dry eyes).    . potassium chloride SA (KLOR-CON M20) 20 MEQ tablet Take 1 tablet by mouth  daily 30 tablet 0  . PROAIR HFA 108 (90 BASE) MCG/ACT inhaler USE 2 PUFFS INTO THE LUNGS EVERY 6 HOURS AS NEEDED FOR WHEEZING OR SHORTNESS OF BREATH. 8.5 Inhaler 3  . promethazine-dextromethorphan (PROMETHAZINE-DM) 6.25-15 MG/5ML syrup One teaspoon at bedtime as needed, for cough 180 mL 0  . SYNTHROID 75 MCG tablet Take 1 tablet by mouth  daily 30 tablet 0  . TOPROL XL 100 MG 24 hr tablet Take 1 tablet (100 mg total) by mouth daily. Take with or immediately following a meal. 90 tablet 1   No current facility-administered medications for this visit.   Facility-Administered Medications Ordered in Other Visits  Medication Dose Route Frequency Provider Last Rate Last Dose  . epoetin alfa (EPOGEN,PROCRIT) injection 40,000 Units  40,000 Units Subcutaneous Once Farrel Gobble, MD         Past Medical History  Diagnosis Date  . DJD (degenerative joint disease) of lumbar spine   . Esophageal reflux   . Other and unspecified hyperlipidemia   . Non-ischemic cardiomyopathy ([place])     a. s/p MDT CRTD  . Obesity, unspecified   . 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 IDA (iron deficiency anemia)     Parenteral iron/[name]  . Adenomatous polyp of colon 12/16/2002    Dr. [name]/[facility]  . Hyperlipemia   . COPD (chronic obstructive pulmonary disease) ([place])        . CHF (congestive heart failure) ([place])        . Constipation        . Gout        . CHB (complete heart block) ([place]) 10/07/2014       . Insomnia        . Unspecified hypothyroidism        . Vertigo        .  DM (diabetes mellitus) ([place]) 2006  . Chronic kidney disease, stage I 2006  . Thyroid cancer ([place])     remote thyroidectomy, no recurrence, pt denies in 2016 thyroid cancer  . Oxygen deficiency 2014    nocturnal;  . Heart murmur   . Allergy     ROS:   All systems reviewed and negative except as noted in the HPI.   Past Surgical History  Procedure Laterality Date  . Thyroidectomy    . Pacemaker placed  2004  . Aortic and mitral valve replacement  2001  . Right breast cyst removed      benign   . Right cataract removed      2005  . Defibrillator implanted 2006    . Cardiac valve replacement    . Cardiac catheterization    . Insert / replace / remove pacemaker    . Esophagogastroduodenoscopy  06/12/2007    Rourk- normal esophagus, small hiatal hernia, otherwise normal stomach, D1, D2  . Colonoscopy  06/12/2007    Rourk- Normal rectum/Normal colon  . Colonoscopy  12/16/02    small hemorrhoids  . Colonoscopy  06/20/2012    Procedure: COLONOSCOPY;  Surgeon: Daneil Dolin, MD;  Location: [facility];  Service: Endoscopy;  Laterality: N/A;  10:45  . Doppler echocardiography  2012  . Doppler echocardiography  05,06,07,08,09,2011  . Bi-ventricular implantable cardioverter defibrillator upgrade N/A 01/18/2015    Procedure: BI-VENTRICULAR IMPLANTABLE CARDIOVERTER DEFIBRILLATOR UPGRADE;  Surgeon: Evans Lance, MD;  Location: [facility] CATH LAB;  Service: Cardiovascular;  Laterality: N/A;  . Icd lead removal Left 02/08/2015    Procedure: ICD LEAD REMOVAL;  Surgeon: Evans Lance, MD;  Location: [facility] OR;  Service: Cardiovascular;  Laterality: Left;  "Will plan extraction and insertion of a BiV PM"  **Dr. Roxan Hockey backing up case**  . Implantable cardioverter defibrillator (icd) generator change Left 02/08/2015    Procedure: ICD GENERATOR CHANGE;  Surgeon: Evans Lance, MD;  Location: [facility];  Service: Cardiovascular;  Laterality: Left;  Marland Kitchen Eye surgery Right 2005  . Eye surgery Left 2006  .  Pacemaker insertion  May 2016     Family History  Problem Relation Age of Onset  . Cancer Mother     behind pancres  . Heart disease Father   . Heart disease Sister     Heart attack  . Heart disease Brother   . Diabetes Brother   . Heart disease Brother   . Diabetes Brother   . Hypertension Brother   . Cancer Brother     lung     Social History   Social History  . Marital Status: Widowed    Spouse Name: N/A  . Number of Children: 0  . Years of Education: N/A   Occupational History  . retired    Social History Main Topics  . Smoking status: Former Smoker -- 0.75 packs/day for 40 years    Types: Cigarettes    Quit date: 03/08/1987  . Smokeless tobacco: Never Used  . Alcohol Use: No  . Drug Use: No  . Sexual Activity: Not Currently   Other Topics Concern  . Not on file   Social History Narrative   From [place] (2004)     BP 155/76 mmHg  Pulse 85  Ht 5\' 7"  (1.702 m)  Wt 223 lb (101.152 kg)  BMI 34.92 kg/m2  SpO2 93%  Physical Exam:  stable appearing 82 yo man, NAD HEENT: Unremarkable Neck:  6 cm JVD, no thyromegally Back:  No CVA tenderness Lungs:  Clear with no wheezes HEART:  Regular rate rhythm, no murmurs, no rubs, no clicks Abd:  soft, positive bowel sounds, no organomegally, no rebound, no guarding Ext:  2 plus pulses, no edema, no cyanosis, no clubbing Skin:  No rashes no nodules Neuro:  CN II through XII intact, motor grossly intact  EKG - nsr with biv pacing  DEVICE  Normal  device function.  See PaceArt for details.   Assess/Plan: 1. Chronic systolic heart failure - her symptoms are class 2. She will continue her current meds. I have encouraged her to increase her physical activity. 2. HTN - her blood pressure is well controlled.  3. ICD - Her medtronic Biv device is working normally. Will recheck in several months.  Mikle Bosworth.D.

## 2016-03-29 ENCOUNTER — Ambulatory Visit (INDEPENDENT_AMBULATORY_CARE_PROVIDER_SITE_OTHER): Payer: Medicare Other | Admitting: *Deleted

## 2016-03-29 DIAGNOSIS — Z952 Presence of prosthetic heart valve: Secondary | ICD-10-CM

## 2016-03-29 DIAGNOSIS — Z5181 Encounter for therapeutic drug level monitoring: Secondary | ICD-10-CM | POA: Diagnosis not present

## 2016-03-29 DIAGNOSIS — Z7901 Long term (current) use of anticoagulants: Secondary | ICD-10-CM

## 2016-03-29 DIAGNOSIS — Z954 Presence of other heart-valve replacement: Secondary | ICD-10-CM

## 2016-03-29 LAB — POCT INR: INR: 2.3

## 2016-03-30 ENCOUNTER — Encounter (HOSPITAL_COMMUNITY): Payer: Medicare Other | Attending: Internal Medicine

## 2016-03-30 ENCOUNTER — Encounter (HOSPITAL_COMMUNITY): Payer: Medicare Other | Attending: Hematology & Oncology

## 2016-03-30 VITALS — BP 138/60 | HR 82 | Temp 98.0°F | Resp 18 | Ht 67.0 in | Wt 223.0 lb

## 2016-03-30 DIAGNOSIS — D638 Anemia in other chronic diseases classified elsewhere: Secondary | ICD-10-CM | POA: Diagnosis not present

## 2016-03-30 DIAGNOSIS — D631 Anemia in chronic kidney disease: Secondary | ICD-10-CM | POA: Diagnosis not present

## 2016-03-30 DIAGNOSIS — D649 Anemia, unspecified: Secondary | ICD-10-CM

## 2016-03-30 DIAGNOSIS — N184 Chronic kidney disease, stage 4 (severe): Secondary | ICD-10-CM | POA: Diagnosis not present

## 2016-03-30 DIAGNOSIS — E611 Iron deficiency: Secondary | ICD-10-CM | POA: Diagnosis not present

## 2016-03-30 DIAGNOSIS — N189 Chronic kidney disease, unspecified: Secondary | ICD-10-CM

## 2016-03-30 LAB — CBC
HCT: 33.1 % — ABNORMAL LOW (ref 36.0–46.0)
Hemoglobin: 10.6 g/dL — ABNORMAL LOW (ref 12.0–15.0)
MCH: 30.4 pg (ref 26.0–34.0)
MCHC: 32 g/dL (ref 30.0–36.0)
MCV: 94.8 fL (ref 78.0–100.0)
Platelets: 196 10*3/uL (ref 150–400)
RBC: 3.49 MIL/uL — ABNORMAL LOW (ref 3.87–5.11)
RDW: 16.3 % — ABNORMAL HIGH (ref 11.5–15.5)
WBC: 9.9 10*3/uL (ref 4.0–10.5)

## 2016-03-30 MED ORDER — EPOETIN ALFA 40000 UNIT/ML IJ SOLN
40000.0000 [IU] | Freq: Once | INTRAMUSCULAR | Status: AC
  Administered 2016-03-30: 40000 [IU] via SUBCUTANEOUS
  Filled 2016-03-30: qty 1

## 2016-03-30 NOTE — Progress Notes (Signed)
PT tolerated injection well at this time with NAD noted.

## 2016-03-31 ENCOUNTER — Ambulatory Visit (INDEPENDENT_AMBULATORY_CARE_PROVIDER_SITE_OTHER): Payer: Medicare Other | Admitting: Cardiovascular Disease

## 2016-03-31 ENCOUNTER — Encounter: Payer: Self-pay | Admitting: Cardiovascular Disease

## 2016-03-31 VITALS — BP 112/52 | HR 81 | Ht 67.0 in | Wt 223.4 lb

## 2016-03-31 DIAGNOSIS — Z954 Presence of other heart-valve replacement: Secondary | ICD-10-CM | POA: Diagnosis not present

## 2016-03-31 DIAGNOSIS — Z95810 Presence of automatic (implantable) cardiac defibrillator: Secondary | ICD-10-CM | POA: Diagnosis not present

## 2016-03-31 DIAGNOSIS — I5022 Chronic systolic (congestive) heart failure: Secondary | ICD-10-CM | POA: Diagnosis not present

## 2016-03-31 DIAGNOSIS — D594 Other nonautoimmune hemolytic anemias: Secondary | ICD-10-CM

## 2016-03-31 DIAGNOSIS — I442 Atrioventricular block, complete: Secondary | ICD-10-CM | POA: Diagnosis not present

## 2016-03-31 DIAGNOSIS — N184 Chronic kidney disease, stage 4 (severe): Secondary | ICD-10-CM

## 2016-03-31 DIAGNOSIS — Z952 Presence of prosthetic heart valve: Secondary | ICD-10-CM

## 2016-03-31 DIAGNOSIS — I10 Essential (primary) hypertension: Secondary | ICD-10-CM

## 2016-03-31 DIAGNOSIS — J449 Chronic obstructive pulmonary disease, unspecified: Secondary | ICD-10-CM

## 2016-03-31 NOTE — Patient Instructions (Signed)
Medication Instructions: Dr Sallyanne Kuster recommends that you continue on your current medications as directed. Please refer to the Current Medication list given to you today.  Labwork: NONE ORDERED  Testing/Procedures: 1. Echocardiogram - Your physician has requested that you have an echocardiogram. Echocardiography is a painless test that uses sound waves to create images of your heart. It provides your doctor with information about the size and shape of your heart and how well your heart's chambers and valves are working. This procedure takes approximately one hour. There are no restrictions for this procedure. This will be done at our [facility] location - [address], [address].  Follow-up: Dr Sallyanne Kuster recommends that you schedule a follow-up appointment in 6 months. You will receive a reminder letter in the mail two months in advance. If you don't receive a letter, please call our office to schedule the follow-up appointment.  If you need a refill on your cardiac medications before your next appointment, please call your pharmacy.

## 2016-03-31 NOTE — Progress Notes (Signed)
Cardiology Office Note    Date:  03/31/2016   ID:  [name] [name], DOB 11-15-1931, MRN [id number]  PCP:  [name], MD  Cardiologist:  Cristopher Peru, M.D. (EP/ICD); Sanda Klein, MD   Chief Complaint  Patient presents with  . Follow-up    History of Present Illness:  [name] [name] is a 82 y.o. female returning in follow-up for valvular heart disease, complete heart block and chronic combined systolic and diastolic heart failure.  She has a compensated heart failure symptoms (NYHA functional class II) and does not have frequent problems with leg swelling. In the hot weather at the end of the day she may have mild edema that resolves by the next morning. She denies chest pain, palpitations, syncope, focal neurological events, other embolic events, bleeding problems.  She has very infrequent wheezing. She is taking her Advair only once daily since she cannot afford it. She rarely needs to use rescue inhaled albuterol.  She saw Dr. Cristopher Peru on June 30 for a CRT-D device check. Device function was normal. No reprogramming was necessary.  She is pacemaker dependent due to complete heart block following aortic and mitral valve replacement with mechanical prostheses. (AVR 2001, MVR 2004, both [employer] prostheses, surgeries performed in [place]). She has severe cardiomyopathy with an estimated left ventricular ejection fraction of 25%, unchanged since 2004. She did not have any coronary artery disease by angiography before her first operation. In May 2016 her initial pacemaker lead was extracted and she received a CRT-D Medtronic Viva XT device by Dr. Cristopher Peru.  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  Her most recent hemoglobin on 03/30/2016 was 10.6 with normocytic normochromic indices. Her ferritin  is quite high, not consistent with iron deficiency. She has chronic kidney disease and her most recent creatinine was 1.54 on 03/14/2016, with an estimated GFR of around 20 mL/minute. She has diabetes mellitus and hyperlipidemia and hyperuricemia. Hemoglobin A1c earlier this year was 5.9%.   Past Medical History  Diagnosis Date  . DJD (degenerative joint disease) of lumbar spine   . Esophageal reflux   . Other and unspecified hyperlipidemia   . Non-ischemic cardiomyopathy ([place])     a. s/p MDT CRTD  . Obesity, unspecified   . 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 IDA (iron deficiency anemia)     Parenteral iron/[name]  . Adenomatous polyp of colon 12/16/2002    Dr. [name]/[facility]  . Hyperlipemia   . COPD (chronic obstructive pulmonary disease) ([place])        . CHF (congestive heart failure) ([place])        . Constipation        . Gout        . CHB (complete heart block) ([place]) 10/07/2014       . Insomnia        . Unspecified hypothyroidism        . Vertigo        . DM (diabetes mellitus) ([place]) 2006  . Chronic kidney disease, stage I 2006  . Thyroid cancer ([place])     remote thyroidectomy, no recurrence, pt denies in 2016 thyroid cancer  . Oxygen deficiency 2014    nocturnal;  . Heart murmur   . Allergy     Past  Surgical History  Procedure Laterality Date  . Thyroidectomy    . Pacemaker placed  2004  . Aortic and mitral valve replacement  2001  . Right breast cyst removed      benign   . Right cataract removed      2005  . Defibrillator implanted 2006    . Cardiac valve replacement    . Cardiac catheterization    . Insert / replace / remove pacemaker    . Esophagogastroduodenoscopy  06/12/2007    Rourk- normal esophagus, small hiatal hernia, otherwise normal stomach, D1, D2  . Colonoscopy  06/12/2007    Rourk- Normal rectum/Normal colon  . Colonoscopy  12/16/02    small hemorrhoids  . Colonoscopy   06/20/2012    Procedure: COLONOSCOPY;  Surgeon: Daneil Dolin, MD;  Location[facility];  Service: Endoscopy;  Laterality: N/A;  10:45  . Doppler echocardiography  2012  . Doppler echocardiography  05,06,07,08,09,2011  . Bi-ventricular implantable cardioverter defibrillator upgrade N/A 01/18/2015    Procedure: BI-VENTRICULAR IMPLANTABLE CARDIOVERTER DEFIBRILLATOR UPGRADE;  Surgeon: Evans Lance, MD;  Location: [facility] CATH LAB;  Service: Cardiovascular;  Laterality: N/A;  . Icd lead removal Left 02/08/2015    Procedure: ICD LEAD REMOVAL;  Surgeon: Evans Lance, MD;  Location[facility];  Service: Cardiovascular;  Laterality: Left;  "Will plan extraction and insertion of a BiV PM"  **Dr. Roxan Hockey backing up case**  . Implantable cardioverter defibrillator (icd) generator change Left 02/08/2015    Procedure: ICD GENERATOR CHANGE;  Surgeon: Evans Lance, MD;  Location: [place];  Service: Cardiovascular;  Laterality: Left;  Marland Kitchen Eye surgery Right 2005  . Eye surgery Left 2006  . Pacemaker insertion  May 2016    Current Medications: Outpatient Prescriptions Prior to Visit  Medication Sig Dispense Refill  . acetaminophen (TYLENOL) 500 MG tablet Take 500 mg by mouth every 6 (six) hours as needed (pain).    Marland Kitchen albuterol (PROVENTIL HFA;VENTOLIN HFA) 108 (90 BASE) MCG/ACT inhaler Inhale 2 puffs into the lungs every 6 (six) hours as needed for wheezing or shortness of breath. 1 Inhaler 3  . albuterol (PROVENTIL) (2.5 MG/3ML) 0.083% nebulizer solution INHALE 1 VIAL VIA NEBULIZER THREE TIMES DAILY. 300 mL 2  . allopurinol (ZYLOPRIM) 100 MG tablet Take 1 tablet (100 mg total) by mouth daily. 90 tablet 1  . azelastine (ASTELIN) 0.1 % nasal spray Place 2 sprays into both nostrils 2 (two) times daily. Use in each nostril as directed 30 mL 12  . benzonatate (TESSALON) 100 MG capsule Take 1 capsule (100 mg total) by mouth 2 (two) times daily as needed for cough. 20 capsule 0  . cholecalciferol (VITAMIN D) 1000  UNITS tablet Take 1,000 Units by mouth daily.    Marland Kitchen COUMADIN 5 MG tablet Take 2 tablets daily 180 tablet 3  . digoxin (LANOXIN) 0.125 MG tablet Take 1 tablet (0.125 mg total) by mouth every other day. 90 tablet 3  . Diphenhyd-Hydrocort-Nystatin (FIRST-DUKES MOUTHWASH) SUSP 2 tsp three times daily, gargle, swish and swallow for 3 days, then as needed, up to 1 week total 237 mL 0  . docusate sodium (COLACE) 100 MG capsule Take 300 mg by mouth at bedtime.    Marland Kitchen ezetimibe-simvastatin (VYTORIN) 10-20 MG tablet Take 1 tablet by mouth 3  times weekly 7 tablet 0  . fluticasone (FLONASE) 50 MCG/ACT nasal spray instill 2 sprays into each nostril once daily 16 g 2  . Fluticasone-Salmeterol (ADVAIR DISKUS) 100-50 MCG/DOSE AEPB inhale  1 dose by mouth twice a day [id number] each 3  . folic acid (FOLVITE) 1 MG tablet Take 1 tablet (1 mg total) by mouth daily. 100 tablet 1  . furosemide (LASIX) 40 MG tablet Take 1 tablet by mouth  daily 90 tablet 1  . gabapentin (NEURONTIN) 300 MG capsule Take 1 capsule (300 mg total) by mouth 2 (two) times daily. 180 capsule 1  . hydrALAZINE (APRESOLINE) 25 MG tablet take 1 tablet by mouth three times a day 270 tablet 3  . HYDROcodone-acetaminophen (NORCO/VICODIN) 5-325 MG tablet Take 1 tablet by mouth every 4 (four) hours as needed for moderate pain (Must last 30 days.  Do not take and drive a car or use machinery.). 120 tablet 0  . isosorbide dinitrate (ISOCHRON) 40 MG CR tablet Take one-half tablet by  mouth twice a day 90 tablet 0  . losartan (COZAAR) 50 MG tablet Take 1 tablet by mouth  daily 90 tablet 1  . meclizine (ANTIVERT) 12.5 MG tablet Take 1 tablet (12.5 mg total) by mouth 2 (two) times daily as needed for dizziness. 30 tablet 2  . metolazone (ZAROXOLYN) 2.5 MG tablet Take 1 tablet (2.5 mg total) by mouth once a week. 2 tablet 0  . montelukast (SINGULAIR) 10 MG tablet Take 1 tablet (10 mg total) by mouth at bedtime. 90 tablet 1  . ondansetron (ZOFRAN) 4 MG tablet Take 1  tablet (4 mg total) by mouth daily. 90 tablet 0  . Polyethyl [name] (SYSTANE OP) Place 1 drop into both eyes daily as needed (dry eyes).    . potassium chloride SA (KLOR-CON M20) 20 MEQ tablet Take 1 tablet by mouth  daily 30 tablet 0  . PROAIR HFA 108 (90 BASE) MCG/ACT inhaler USE 2 PUFFS INTO THE LUNGS EVERY 6 HOURS AS NEEDED FOR WHEEZING OR SHORTNESS OF BREATH. 8.5 Inhaler 3  . promethazine-dextromethorphan (PROMETHAZINE-DM) 6.25-15 MG/5ML syrup One teaspoon at bedtime as needed, for cough 180 mL 0  . SYNTHROID 75 MCG tablet Take 1 tablet by mouth  daily 30 tablet 0  . TOPROL XL 100 MG 24 hr tablet Take 1 tablet (100 mg total) by mouth daily. Take with or immediately following a meal. 90 tablet 1  . Digoxin (LANOXIN) 62.5 MCG TABS Take 1 tablet by mouth  every day (Patient not taking: Reported on 03/31/2016) 30 tablet 2   Facility-Administered Medications Prior to Visit  Medication Dose Route Frequency Provider Last Rate Last Dose  . epoetin alfa (EPOGEN,PROCRIT) injection 40,000 Units  40,000 Units Subcutaneous Once Farrel Gobble, MD         Allergies:   Aspirin; Fentanyl; Niacin; and Penicillins   Social History   Social History  . Marital Status: Widowed    Spouse Name: N/A  . Number of Children: 0  . Years of Education: N/A   Occupational History  . retired    Social History Main Topics  . Smoking status: Former Smoker -- 0.75 packs/day for 40 years    Types: Cigarettes    Quit date: 03/08/1987  . Smokeless tobacco: Never Used  . Alcohol Use: No  . Drug Use: No  . Sexual Activity: Not Currently   Other Topics Concern  . None   Social History Narrative   From [place] (2004)     Family History:  The patient's family history includes Cancer in her brother and mother; Diabetes in her brother and brother; Heart disease in her brother, brother, father, and sister; Hypertension in her  brother.   ROS:   Please see the history of present illness.    ROS  All other systems reviewed and are negative.   PHYSICAL EXAM:   VS:  BP 112/52 mmHg  Pulse 81  Ht 5\' 7"  (1.702 m)  Wt 101.334 kg (223 lb 6.4 oz)  BMI 34.98 kg/m2   GEN: Well nourished, well developed, in no acute distress HEENT: normal Neck: no JVD, carotid bruits, or masses Cardiac: Crisp mechanical prosthetic valve clicks RRR; 1/6 aortic ejection murmur is early peaking, no diastolic murmurs, rubs, or gallops,no edema  Respiratory:  clear to auscultation bilaterally, normal work of breathing GI: soft, nontender, nondistended, + BS MS: no deformity or atrophy Skin: warm and dry, no rash Neuro:  Alert and Oriented x 3, Strength and sensation are intact Psych: euthymic mood, full affect  Wt Readings from Last 3 Encounters:  03/31/16 101.334 kg (223 lb 6.4 oz)  03/30/16 101.152 kg (223 lb)  03/24/16 101.152 kg (223 lb)      Studies/Labs Reviewed:   EKG:  EKG is ordered today.  The ekg ordered today demonstrates AV sequential pacing with occasional PVCs  Recent Labs: 03/14/2016: ALT 16; ALT 16; BUN 55*; BUN 52*; Creatinine, Ser 1.54*; Creatinine, Ser 1.54*; Potassium 4.8; Potassium 4.8; Sodium 136; Sodium 136; TSH 0.815 03/30/2016: Hemoglobin 10.6*; Platelets 196   Lipid Panel    Component Value Date/Time   CHOL 148 10/14/2015 1130   TRIG 201* 10/14/2015 1130   HDL 29* 10/14/2015 1130   CHOLHDL 5.1 10/14/2015 1130   VLDL 40 10/14/2015 1130   LDLCALC 79 10/14/2015 1130     ASSESSMENT:    1. Chronic systolic heart failure ([place])   2. CHB (complete heart block) (HCC)   3. S/P ICD (internal cardiac defibrillator) procedure   4. S/P MVR (mitral valve replacement)   5. S/P AVR (aortic valve replacement)   6. 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 Essential hypertension   8. CKD (chronic kidney disease), stage IV ([place])   9. Chronic obstructive pulmonary disease, unspecified COPD type ([place])   10. Morbid obesity due to excess calories ([place])      PLAN:  In  order of problems listed above:  1. CHF: She appears well compensated, euvolemic/NYHA functional class II on a very low dose of loop diuretic as well as a low dose of metolazone once a week. Continue current management. Reminded about the importance of daily weights and sodium restriction. Time to reevaluate left ventricular ejection fraction following initiation of CRT. 2. CHB: Pacemaker dependent. 3. CRT-D: Recent check with Dr. Lovena Le showed normal findings 4. Mechanical mitral valve prosthesis 5. Mechanical aortic valve prosthesis: Check echo. 6. 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 7. HTN: Well controlled 8. CKD: Stable and improved from earlier in the year 9. COPD/asthma: Seems to be well compensated even on lower than recommended doses of maintenance bronchodilator/inhaled steroid 10. Weight loss recommended    Medication Adjustments/Labs and Tests Ordered: Current medicines are reviewed at length with the patient today.  Concerns regarding medicines are outlined above.  Medication changes, Labs and Tests ordered today are listed in the Patient Instructions below. Patient Instructions  Medication Instructions: Dr Sallyanne Kuster recommends that you continue on your current medications as directed. Please refer to the Current Medication list given to you today.  Labwork: NONE ORDERED  Testing/Procedures: 1. Echocardiogram - Your physician has requested that you have an echocardiogram. Echocardiography is a  painless test that uses sound waves to create images of your heart. It provides your doctor with information about the size and shape of your heart and how well your heart's chambers and valves are working. This procedure takes approximately one hour. There are no restrictions for this procedure. This will be done at our [facility] location - [address].  Follow-up: Dr Sallyanne Kuster recommends that  you schedule a follow-up appointment in 6 months. You will receive a reminder letter in the mail two months in advance. If you don't receive a letter, please call our office to schedule the follow-up appointment.  If you need a refill on your cardiac medications before your next appointment, please call your pharmacy.     Signed, Sanda Klein, MD  03/31/2016 3:10 PM    [facility] [place], [place], [place]  [address] Phone: [phone]; Fax: [phone]

## 2016-04-04 ENCOUNTER — Other Ambulatory Visit: Payer: Self-pay

## 2016-04-04 ENCOUNTER — Telehealth: Payer: Self-pay | Admitting: Family Medicine

## 2016-04-04 DIAGNOSIS — J449 Chronic obstructive pulmonary disease, unspecified: Secondary | ICD-10-CM

## 2016-04-04 DIAGNOSIS — I5022 Chronic systolic (congestive) heart failure: Secondary | ICD-10-CM

## 2016-04-04 DIAGNOSIS — Z952 Presence of prosthetic heart valve: Secondary | ICD-10-CM

## 2016-04-04 MED ORDER — FOLIC ACID 1 MG PO TABS: 1.0000 mg | ORAL_TABLET | Freq: Every day | ORAL | Status: DC

## 2016-04-04 NOTE — Telephone Encounter (Signed)
Pt has questions as to whether she needs lung function test, she does have appt with Dr Luan Pulling and is advised to have this done  Requests short term placement at [facility] center for pain management and I explained that is not likely , HOWEVER, pls get [facility]/ social work involved to see if affordable assistance with house  chores , in particular, with her spinal stenosis, standing, cooking, cleaning etc is increasingly difficult if not "impossible"  Pls help as able

## 2016-04-04 NOTE — Telephone Encounter (Signed)
Patient is asking for Dr. [name] to please call her this afternoon after she has finished seeing patients, please advise?

## 2016-04-05 ENCOUNTER — Ambulatory Visit (HOSPITAL_COMMUNITY): Admission: RE | Admit: 2016-04-05 | Payer: Medicare Other | Source: Ambulatory Visit

## 2016-04-07 ENCOUNTER — Other Ambulatory Visit (HOSPITAL_COMMUNITY): Payer: Self-pay | Admitting: Surgery

## 2016-04-07 DIAGNOSIS — Z09 Encounter for follow-up examination after completed treatment for conditions other than malignant neoplasm: Secondary | ICD-10-CM

## 2016-04-07 DIAGNOSIS — N6452 Nipple discharge: Secondary | ICD-10-CM

## 2016-04-07 NOTE — Telephone Encounter (Signed)
Referral entered  

## 2016-04-07 NOTE — Addendum Note (Signed)
Addended by: Denman George B on: 04/07/2016 03:21 PM   Modules accepted: Orders

## 2016-04-07 NOTE — Telephone Encounter (Signed)
Spoke with patient and [facility].  Patient does qualify for [facility] services.  She appreciates the help and is willing to try this option.

## 2016-04-14 ENCOUNTER — Other Ambulatory Visit: Payer: Self-pay | Admitting: *Deleted

## 2016-04-14 ENCOUNTER — Ambulatory Visit (HOSPITAL_COMMUNITY)
Admission: RE | Admit: 2016-04-14 | Discharge: 2016-04-14 | Disposition: A | Discharge status: Home / Self Care | Payer: Medicare Other | Source: Ambulatory Visit | Attending: Family Medicine | Admitting: Family Medicine

## 2016-04-14 DIAGNOSIS — R05 Cough: Secondary | ICD-10-CM | POA: Diagnosis not present

## 2016-04-14 DIAGNOSIS — R059 Cough, unspecified: Secondary | ICD-10-CM

## 2016-04-14 LAB — PULMONARY FUNCTION TEST
DL/VA % pred: 55 %
DL/VA: 2.84 ml/min/mmHg/L
DLCO unc % pred: 32 %
DLCO unc: 9.15 ml/min/mmHg
FEF 25-75 Post: 0.63 L/sec
FEF 25-75 Pre: 0.74 L/sec
FEF2575-%Change-Post: -14 %
FEF2575-%Pred-Post: 43 %
FEF2575-%Pred-Pre: 50 %
FEV1-%Change-Post: -8 %
FEV1-%Pred-Post: 53 %
FEV1-%Pred-Pre: 57 %
FEV1-Post: 0.95 L
FEV1-Pre: 1.03 L
FEV1FVC-%Change-Post: -9 %
FEV1FVC-%Pred-Pre: 93 %
FEV6-%Change-Post: 0 %
FEV6-%Pred-Post: 66 %
FEV6-%Pred-Pre: 66 %
FEV6-Post: 1.47 L
FEV6-Pre: 1.47 L
FEV6FVC-%Change-Post: -1 %
FEV6FVC-%Pred-Post: 102 %
FEV6FVC-%Pred-Pre: 104 %
FVC-%Change-Post: 2 %
FVC-%Pred-Post: 65 %
FVC-%Pred-Pre: 64 %
FVC-Post: 1.5 L
FVC-Pre: 1.47 L
Post FEV1/FVC ratio: 63 %
Post FEV6/FVC ratio: 98 %
Pre FEV1/FVC ratio: 70 %
Pre FEV6/FVC Ratio: 100 %
RV % pred: 129 %
RV: 3.34 L
TLC % pred: 93 %
TLC: 5.14 L

## 2016-04-14 MED ORDER — ALBUTEROL SULFATE (2.5 MG/3ML) 0.083% IN NEBU
2.5000 mg | INHALATION_SOLUTION | Freq: Once | RESPIRATORY_TRACT | Status: AC
  Administered 2016-04-14: 2.5 mg via RESPIRATORY_TRACT

## 2016-04-14 NOTE — Patient Outreach (Signed)
[place] [facility]) Care Management  04/14/2016  [name] [name] [birth year] [id number]  Referral from MD office: Patient in need of respite help & transportation.  Telephone call to patient; left HIPPA compliant voice mail requesting call back.  Plan: Will follow up.  Sherrin Daisy, RN BSN [place] Management Coordinator [facility] Care Management  [phone]

## 2016-04-17 ENCOUNTER — Other Ambulatory Visit: Payer: Self-pay | Admitting: *Deleted

## 2016-04-17 DIAGNOSIS — I5022 Chronic systolic (congestive) heart failure: Secondary | ICD-10-CM

## 2016-04-17 NOTE — Patient Outreach (Signed)
[facility]) Care Management  04/17/2016  Alxis Brodnax October 15, 1931 [id number]  MD referral -"patient in need of respite help, transportation, and nursing needs.   Telephone call to patient who was advised of [facility] services. Patient aware of MD referral for case management. HIPPA verification received from patient.   Patient states major health concern is her heart. States she has heart failure and has automatic defibrillator, and 2 artificial valves in place. . States her current weight is 223 pounds and she has been advised to notify MD office if she weighs over 240 pounds. States has scales but does not weigh daily. States she is seeing heart specialist as recommended.   States other conditions include COPD. Using oxygen at night at 2 liters. Uses inhalers daily and has emergency inhaler & nebulizer if unable to breathe. States she has appointment to see lung specialist this week.  States she has kidney disease and had to go to emergency room in June because of blood in urine. States she is not on dialysis. Voices that her diabetes is under control without use of medication. (last A1c 5.9). States has arthritis in joints and does have pain. States currently using cane or walker to get around. Uses scooter when grocery shopping.    Patient voices that she currently lives alone and cares & cooks for self. States does not drive but has been able to get relative to take to MD appointments. States more difficult to get to appointments out of town. Patient getting medications from local pharmacy & mail order. States managing her own medications.     Assessment: -Lives alone -Multiple complex diagnoses _Transportation provided by relative; difficult to get to out of town appointments. -Taking 15 medications -Having pain due to arthritis _Falls risk; using walker or cane to ambulate. [facility] services would be of value to patient. Patient agrees to services.  Plan: Refer to care  management assistant to assign to [facility] for complex case management; refer to Clinical Social worker for community resources; refer to pharmacist for taking 15 medications.   Sherrin Daisy, RN BSN [place] Management Coordinator [facility] Care Management  [phone]

## 2016-04-18 ENCOUNTER — Other Ambulatory Visit: Payer: Self-pay | Admitting: *Deleted

## 2016-04-18 DIAGNOSIS — M5416 Radiculopathy, lumbar region: Secondary | ICD-10-CM | POA: Diagnosis not present

## 2016-04-18 NOTE — Patient Outreach (Signed)
Referral received for [facility] community care coordination. Call to patient phone numbers listed. No answer to either, was able to leave a message requesting call back. Plan to attempt again later. [name] [name]. Laymond Purser, RN, BSN, [place] [phone]

## 2016-04-19 ENCOUNTER — Ambulatory Visit (INDEPENDENT_AMBULATORY_CARE_PROVIDER_SITE_OTHER): Payer: Medicare Other | Admitting: *Deleted

## 2016-04-19 DIAGNOSIS — Z954 Presence of other heart-valve replacement: Secondary | ICD-10-CM | POA: Diagnosis not present

## 2016-04-19 DIAGNOSIS — Z5181 Encounter for therapeutic drug level monitoring: Secondary | ICD-10-CM

## 2016-04-19 DIAGNOSIS — Z7901 Long term (current) use of anticoagulants: Secondary | ICD-10-CM | POA: Diagnosis not present

## 2016-04-19 DIAGNOSIS — R05 Cough: Secondary | ICD-10-CM | POA: Diagnosis not present

## 2016-04-19 DIAGNOSIS — J449 Chronic obstructive pulmonary disease, unspecified: Secondary | ICD-10-CM | POA: Diagnosis not present

## 2016-04-19 DIAGNOSIS — I10 Essential (primary) hypertension: Secondary | ICD-10-CM | POA: Diagnosis not present

## 2016-04-19 DIAGNOSIS — Z952 Presence of prosthetic heart valve: Secondary | ICD-10-CM

## 2016-04-19 LAB — POCT INR: INR: 3.8

## 2016-04-19 MED ORDER — ISOSORBIDE DINITRATE ER 40 MG PO TBCR: EXTENDED_RELEASE_TABLET | ORAL | 6 refills | Status: DC

## 2016-04-20 ENCOUNTER — Encounter (HOSPITAL_COMMUNITY): Payer: Medicare Other

## 2016-04-20 ENCOUNTER — Encounter (HOSPITAL_BASED_OUTPATIENT_CLINIC_OR_DEPARTMENT_OTHER): Payer: Medicare Other

## 2016-04-20 VITALS — BP 116/54 | HR 68 | Temp 98.2°F | Resp 20

## 2016-04-20 DIAGNOSIS — N184 Chronic kidney disease, stage 4 (severe): Secondary | ICD-10-CM

## 2016-04-20 DIAGNOSIS — E611 Iron deficiency: Secondary | ICD-10-CM | POA: Diagnosis not present

## 2016-04-20 DIAGNOSIS — D649 Anemia, unspecified: Secondary | ICD-10-CM

## 2016-04-20 DIAGNOSIS — D638 Anemia in other chronic diseases classified elsewhere: Secondary | ICD-10-CM | POA: Diagnosis not present

## 2016-04-20 DIAGNOSIS — D631 Anemia in chronic kidney disease: Secondary | ICD-10-CM

## 2016-04-20 LAB — HEMOGLOBIN: Hemoglobin: 10.8 g/dL — ABNORMAL LOW (ref 12.0–15.0)

## 2016-04-20 MED ORDER — EPOETIN ALFA 40000 UNIT/ML IJ SOLN
40000.0000 [IU] | Freq: Once | INTRAMUSCULAR | Status: AC
  Administered 2016-04-20: 40000 [IU] via SUBCUTANEOUS
  Filled 2016-04-20: qty 1

## 2016-04-20 NOTE — Progress Notes (Signed)
[name] [name] presents today for injection per MD orders. Procrit 40,000units/ml administered SQ in left Abdomen. Administration without incident. Patient tolerated well.

## 2016-04-21 ENCOUNTER — Other Ambulatory Visit: Payer: Self-pay | Admitting: *Deleted

## 2016-04-21 NOTE — Patient Outreach (Signed)
Call to patient to schedule initial assessment. Patient referred to [facility] from MD office. Spoke with patient she states she is not doing too well today as her brother passed away and his funeral service is tomorrow. Patient did agree to schedule home visit. Preferred for it to be in a couple of weeks due to the death in her family and due to other medical appointments she has already scheduled. Scheduled appointment for 8/10. Reminded patient of referrals to other [facility] disciplines-SW and pharmacy and that they may call. She states she has an Answering machine and they can leave a message if needed. Plan to visit in August. Patient has RNCM contact if she needs to reschedule. [name] [name]. Laymond Purser, RN, BSN, [place] [phone]

## 2016-04-24 ENCOUNTER — Encounter: Payer: Self-pay | Admitting: Licensed Clinical Social Worker

## 2016-04-24 ENCOUNTER — Telehealth: Payer: Self-pay

## 2016-04-24 ENCOUNTER — Other Ambulatory Visit: Payer: Self-pay | Admitting: Licensed Clinical Social Worker

## 2016-04-24 NOTE — Patient Outreach (Signed)
Assessment:  CSW received referral on [name] [name]. CSW completed chart review on client on 04/24/16. Client does live alone at her home. She sees Dr. [name] as primary care doctor.  Client has also been referred to [place] for [place] care. CSW spoke via phone with client on 04/24/16. CSW verified client identity. CSW received verbal permission on 04/24/16 from client for CSW to speak with client about current client needs. CSW informed [name] [name] that [name] [name], case management assistant, would be mailing [name] [name] a [facility] consent form to review.  She said she would review [name] [name] consent form.  CSW informed [name] [name] of [name] [name] program support in pharmacy, nursing and social work [name]. [name] [name] and CSW reviewed and completed needed [name] [name] assessment forms for client.  Client said she has some support from her cousins. She said sometimes she has transport help for local appointments but needs assistance sometimes with transport to her out of town appointments. She has [employer] but not Medicaid. CSW and client spoke of client care plan. CSW encouraged [name] [name] to communicate with CSW in next 30 days to discuss transportation resources for client in the community. Client also said that her brother had died recently and she and her family were trying to manage arrangements and financial matters related to recent death of her brother. She said she was close to her brother and her brother had also helped her occasionally by transporting her to and from some of her scheduled medical appointments. CSW expressed sympathy to [name] [name] on recent death of her brother.  CSW informed [name] [name] that CSW and RN [name] [name] had been assigned to provide [name] [name] program support for client.  Client said she uses either a cane or walker to help her ambulate.  She said that transportation to her out of town appointments was sometimes challenging to her. CSW thanked [name] for phone conversation with CSW on 04/24/16. CSW  informed [name] [name] that CSW would speak further with her about her current transport needs related to attending scheduled client medical appointments. [name] [name] was appreciative of call from [name] [name] on 04/24/16.   Plan:  Client to communicate with CSW in the next 30 days to discuss transportation resources for client in the community. CSW to collaborate with RN [name] [name] in monitoring needs of client. CSW to call client in one week to assess client needs at that time.[name] [name].[name] [name] MSW, LCSW Licensed Clinical Social Worker [facility] Care Management [phone]

## 2016-04-24 NOTE — Telephone Encounter (Signed)
Request for surgical clearance:   1. What type surgery is being performed? Spine procedure 2. When is this surgery scheduled? pending 3. Are there any medications that need to be held prior to surgery and how long? Coumadin; 5 days prior to procedure 4. Name of the physician performing surgery: Dr Ernestina Patches at [facility] 5. What is the office phone and fax number?  Phone [phone]  Fax [phone]   Patient has been cleared by Tana [place], PharmD. May discontinue anticoagulant medication with a bridging medication protocol to be managed by the prescribing physician. Patient has routine INR checks at our [place] location with Edrick Oh, RN. Georgina Peer has sent a message to [place].  Clearance form faxed to [facility].

## 2016-04-27 ENCOUNTER — Telehealth: Payer: Self-pay | Admitting: Family Medicine

## 2016-04-27 ENCOUNTER — Other Ambulatory Visit: Payer: Self-pay

## 2016-04-27 MED ORDER — ALLOPURINOL 100 MG PO TABS: 100.0000 mg | ORAL_TABLET | Freq: Every day | ORAL | 1 refills | Status: DC

## 2016-04-27 MED ORDER — POTASSIUM CHLORIDE CRYS ER 20 MEQ PO TBCR: EXTENDED_RELEASE_TABLET | ORAL | 1 refills | Status: DC

## 2016-04-27 MED ORDER — SYNTHROID 75 MCG PO TABS: ORAL_TABLET | ORAL | 1 refills | Status: DC

## 2016-04-27 MED ORDER — FUROSEMIDE 40 MG PO TABS: ORAL_TABLET | ORAL | 1 refills | Status: DC

## 2016-04-27 NOTE — Telephone Encounter (Signed)
potassium chloride SA (KLOR-CON M20) 20 MEQ tablet, allopurinol (ZYLOPRIM) 100 MG tablet , furosemide (LASIX) 40 MG tablet , SYNTHROID 75 MCG tablet,  Please send 90 day supply to mail order

## 2016-04-27 NOTE — Telephone Encounter (Signed)
Requested medications refilled to pharmacy.

## 2016-04-27 NOTE — Telephone Encounter (Signed)
[facility] is asking for 90 supply of SYNTHROID 75 MCG tablet  ,furosemide (LASIX) 40 MG tablet, allopurinol (ZYLOPRIM) 100 MG tablet,TOPROL XL 100 MG 24 hr tablet,potassium chloride SA (KLOR-CON M20) 20 MEQ tablet sent to mail order      Order Details   Order Details  Order Details

## 2016-04-28 ENCOUNTER — Other Ambulatory Visit (HOSPITAL_COMMUNITY): Payer: Self-pay | Admitting: Surgery

## 2016-04-28 ENCOUNTER — Other Ambulatory Visit: Payer: Self-pay | Admitting: Licensed Clinical Social Worker

## 2016-04-28 DIAGNOSIS — Z09 Encounter for follow-up examination after completed treatment for conditions other than malignant neoplasm: Secondary | ICD-10-CM

## 2016-04-28 DIAGNOSIS — N6452 Nipple discharge: Secondary | ICD-10-CM

## 2016-04-28 NOTE — Patient Outreach (Signed)
Assessment:  CSW spoke via phone with client on 04/28/16. CSW verified client client identity. CSW received verbal permission from client on 04/28/16 for CSW to speak with client about current client needs.CSW and client spoke of client needs.  Client does live alone. Client sees Dr. [name] as primary care doctor.  RN Burgess Amor was referred for [place] support for client.  Client said she uses a cane or a walker to help her ambulate as needed.  Client said she has some family help with transport to local appointments.  She said she sometimes is challenged by transport support for out of town client medical appointmetns.  Client has Medicare but not Medicaid. CSW and client spoke of client care plan. CSW encouraged client to communicate with CSW in next 30 days to discuss community resources for transportation assistance for client.  CSW spoke with Robert about [employer] transport services. [employer] said she had heard of this service. CSW encouraged Aubriannah and her niece to call [employer] and talk with transport agency about cost for client transport to and from some of her [place], [place] medical appointments. CSW talked with Yatzary about customary cost of other transport agencies in area and that [employer] may be most economical way for client to go to and from some of her [place], [place] medical appointments. Client was appreciative of information and said she and her niece would call [employer] and talk with agency representative about transport rates for client transport with that company. Client said she goes to orthopedic doctor in [place] as scheduled.  She said she also sees a Adult nurse in [place], [place] as scheduled.   CSW thanked client for phone call with CSW on 04/28/16. CSW encouraged Jandy to call CSW at [phone] as needed to discuss social work needs of client.    Plan:  Client to communicate with CSW in next 30 days to discuss community resources for transportation  assistance for client. CSW to call client in 3 weeks to assess client needs at that time.  [name].Radha Coggins MSW, LCSW Licensed Clinical Social Worker [facility] Care Management [phone]

## 2016-05-02 IMAGING — CT CT HEAD W/O CM
1 series · 16 of 30 positions shown, 20 images · non-contrast
Comparison: 09/04/2012

CLINICAL DATA: New onset headache after hitting left temporal
region 3 times over the past 2 months.

EXAM:
CT HEAD WITHOUT CONTRAST
TECHNIQUE: Contiguous axial images were obtained from the base of the skull
through the vertex without intravenous contrast.

[Series 2: headseq 4.8 h37s · axial · 0.43mm/px · z∈[+84,+219]mm · 16 of 30 slices shown, 20 images]
[im 2/30  brain]
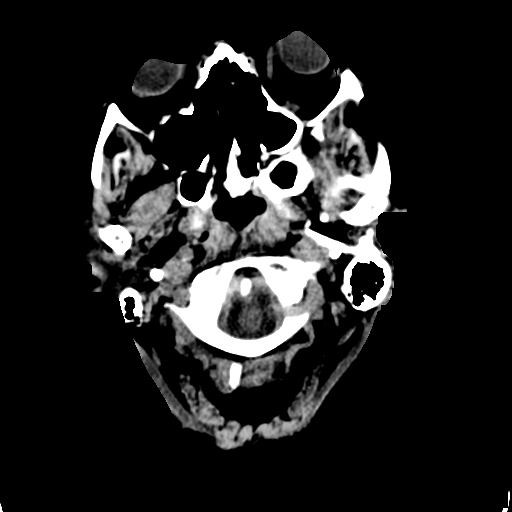
[im 2/30  bone]
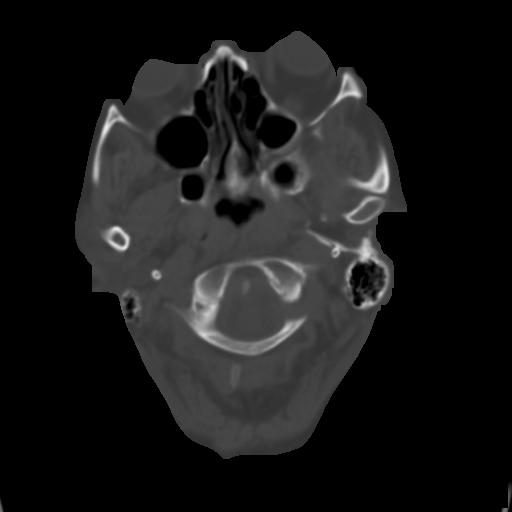
[im 4/30  brain]
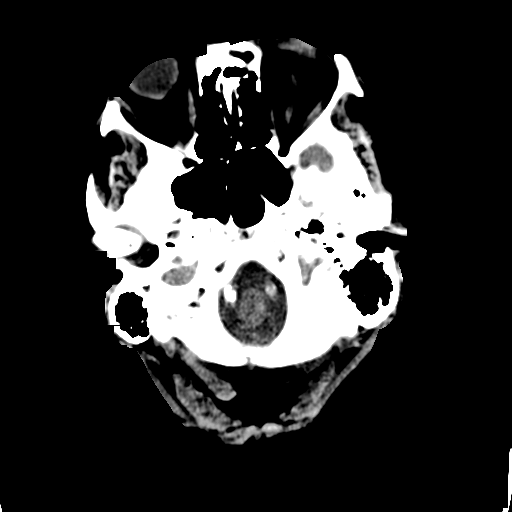
[im 6/30  brain]
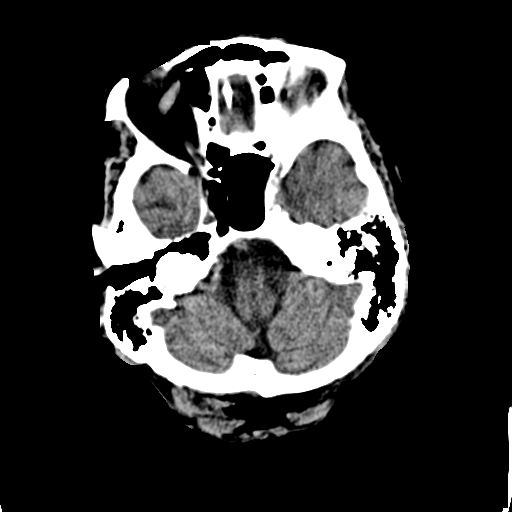
[im 8/30  brain]
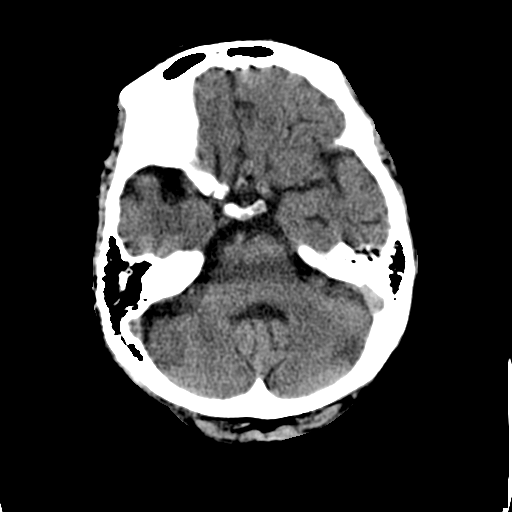
[im 9/30  brain]
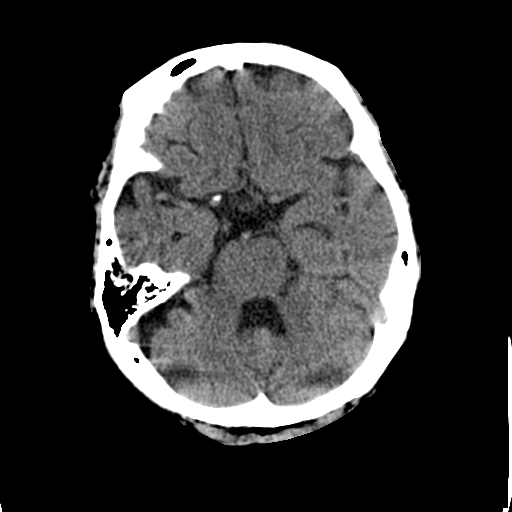
[im 9/30  bone]
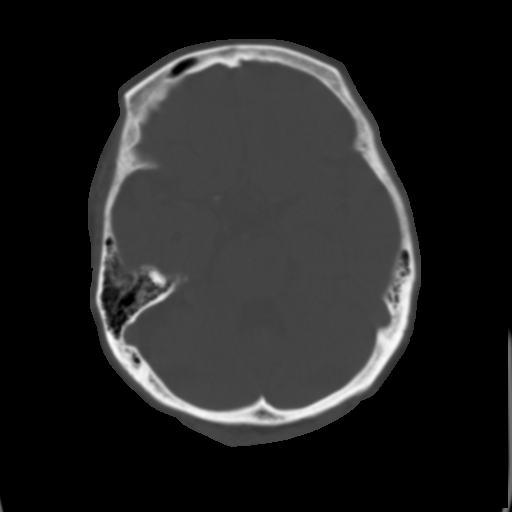
[im 11/30  brain]
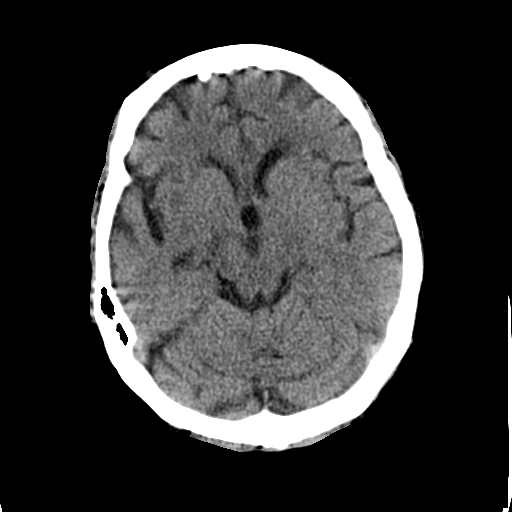
[im 13/30  brain]
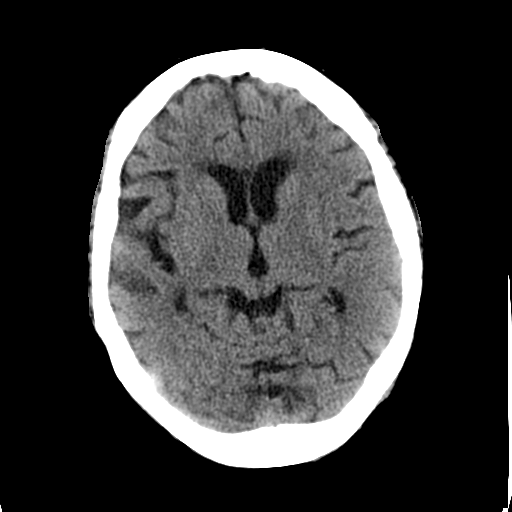
[im 15/30  brain]
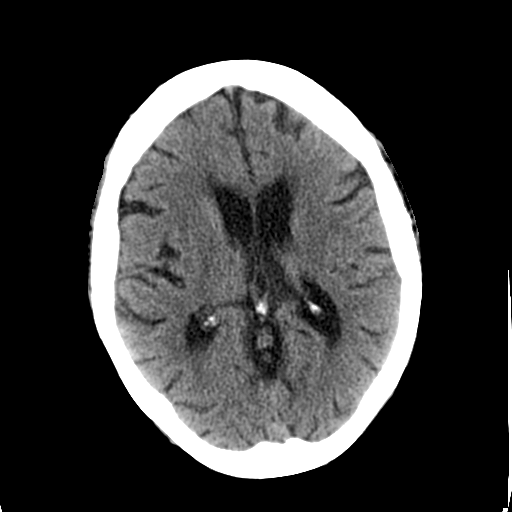
[im 16/30  brain]
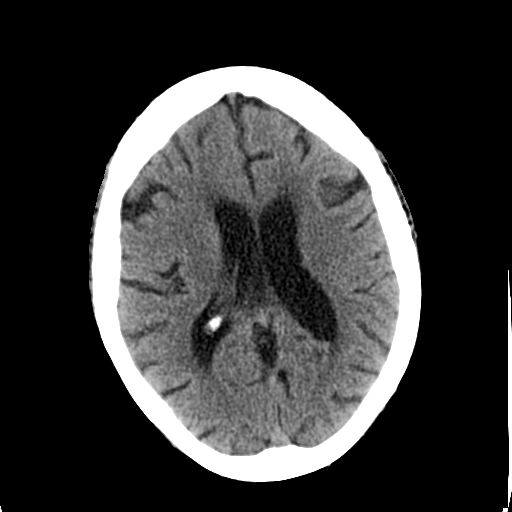
[im 16/30  bone]
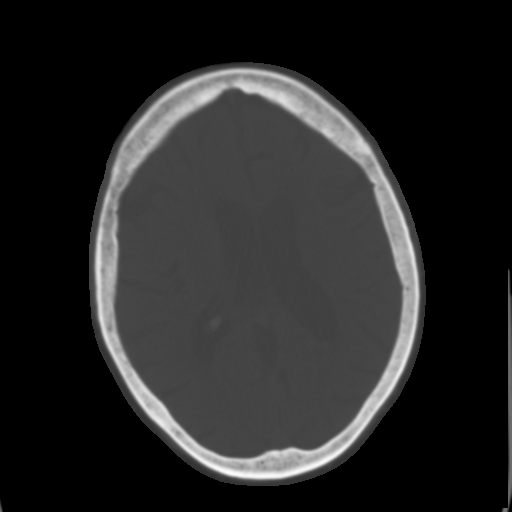
[im 18/30  brain]
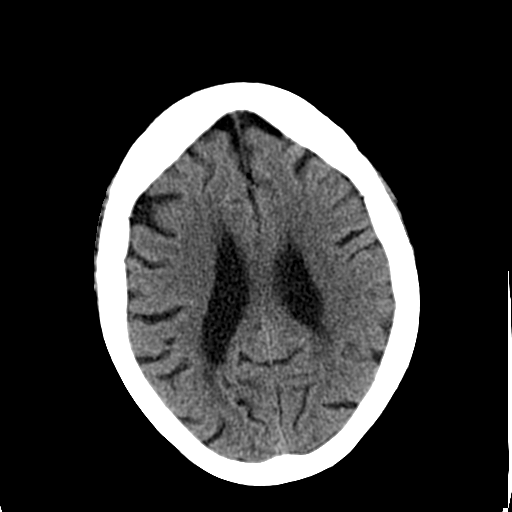
[im 20/30  brain]
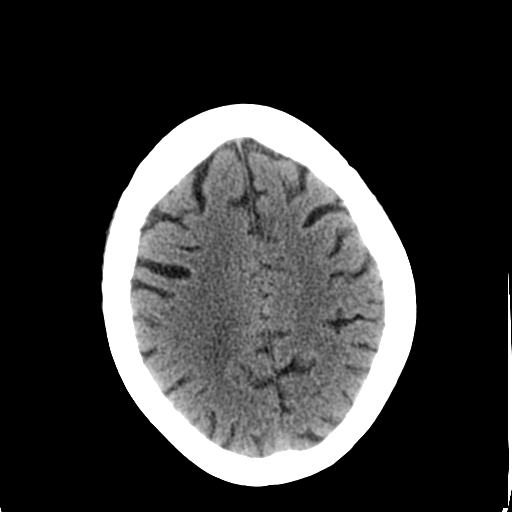
[im 22/30  brain]
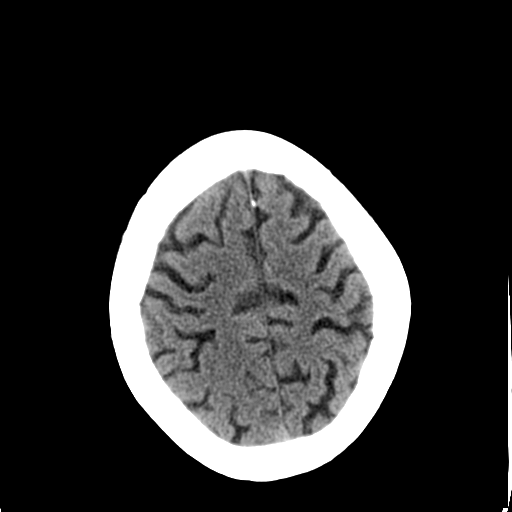
[im 23/30  brain]
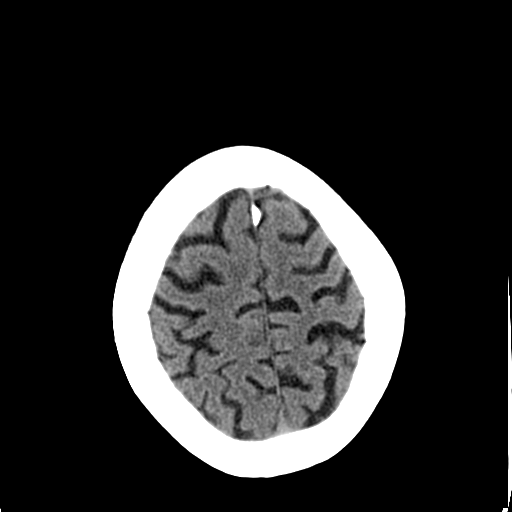
[im 23/30  bone]
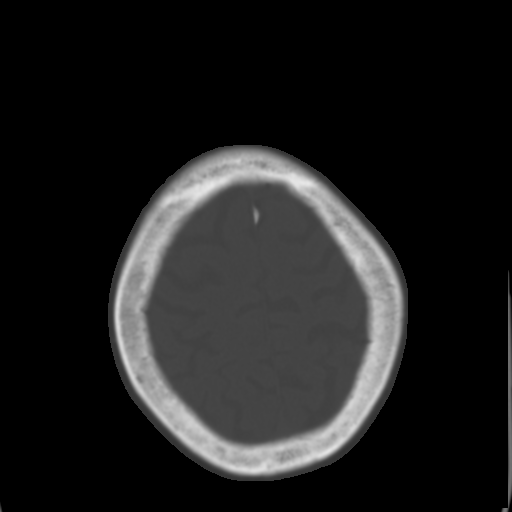
[im 25/30  brain]
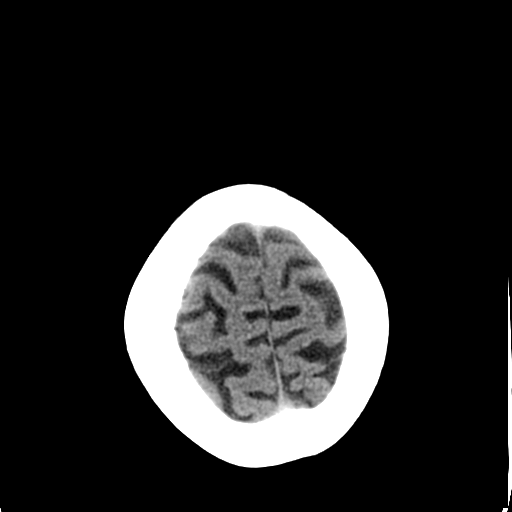
[im 27/30  brain]
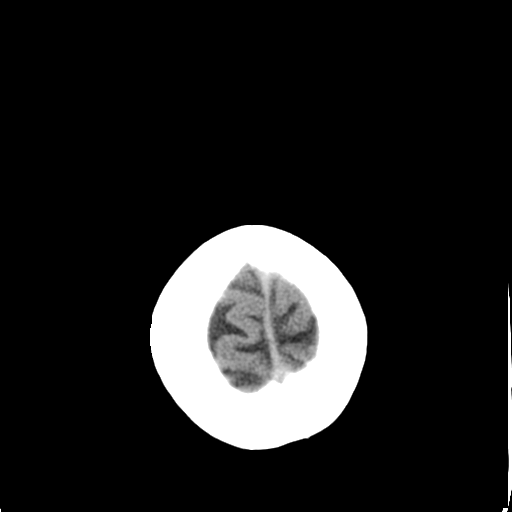
[im 29/30  brain]
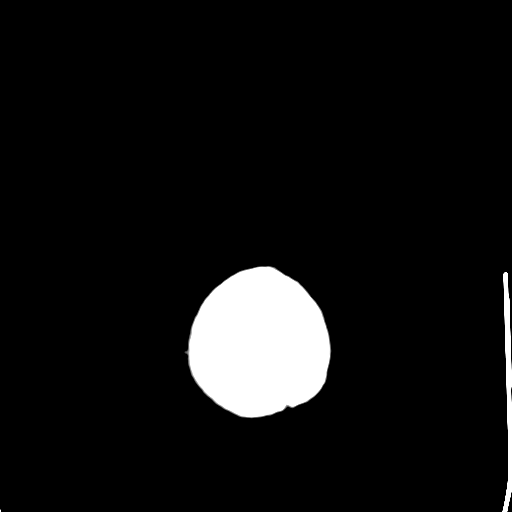

[16 of 30 positions shown; findings below may reference images not displayed]

FINDINGS: Mild age related atrophy. No acute intracranial abnormality.
Specifically, no hemorrhage, hydrocephalus, mass lesion, acute
infarction, or significant intracranial injury. No acute calvarial
abnormality. Visualized paranasal sinuses and mastoids clear.
Orbital soft tissues unremarkable.
IMPRESSION: No acute intracranial abnormality.

## 2016-05-04 ENCOUNTER — Encounter: Payer: Self-pay | Admitting: *Deleted

## 2016-05-04 ENCOUNTER — Other Ambulatory Visit: Payer: Self-pay | Admitting: Family Medicine

## 2016-05-04 ENCOUNTER — Ambulatory Visit: Payer: Self-pay | Admitting: *Deleted

## 2016-05-04 ENCOUNTER — Other Ambulatory Visit: Payer: Self-pay | Admitting: *Deleted

## 2016-05-04 NOTE — Patient Outreach (Signed)
Call to patient to remind of visit today. Patient states she has a lot of "running around to do" and requests to cancel and reschedule. RNCM had cancellation for 05/05/16 Patient states tomorrow will "work better". Plan to make home visit tomorrow. Royetta Crochet. Laymond Purser, RN, BSN, [facility] Liaison [phone]

## 2016-05-05 ENCOUNTER — Other Ambulatory Visit: Payer: Self-pay | Admitting: *Deleted

## 2016-05-05 ENCOUNTER — Encounter: Payer: Self-pay | Admitting: *Deleted

## 2016-05-05 NOTE — Patient Outreach (Signed)
[facility]) Care Management   05/05/2016  [name] [name] 10-02-1931 [id number]  [name] [name] is an 82 y.o. female  Subjective:  Patient reports she has a lot of errands to continue to run regarding her brother's death. Patient states she had moved to [place] several years ago to live with her brother. She did not expect to outlive him.   Patient reports she has some medication affordability issues (note [place] referral already made)  Patient reports she is getting transportation from family, but has resources from [place] worker to review when she has a chance.  Patient agrees to start weighing daily. Patient will review EMMI education later in month.  Patient denies any hypotensive symptoms.   Objective:   Patient neatly groomed and dressed, home cluttered but clean. BP (!) 98/50 (BP Location: Right Arm)   Pulse 70   Resp 20   SpO2 94%  Review of Systems  Eyes: Negative.   Respiratory: Negative.   Cardiovascular: Negative.   Musculoskeletal: Positive for back pain.       Pain when ambulating, getting injection on Monday    Physical Exam  Constitutional: She is oriented to person, place, and time.  Cardiovascular: Normal rate.   Respiratory: Effort normal and breath sounds normal.  GI: Soft. Bowel sounds are normal.  Musculoskeletal: Normal range of motion.  Neurological: She is alert and oriented to person, place, and time.  Skin: Skin is warm and dry.    Encounter Medications:   Outpatient Encounter Prescriptions as of 05/05/2016  Medication Sig Note  . acetaminophen (TYLENOL) 500 MG tablet Take 500 mg by mouth every 6 (six) hours as needed (pain).   Marland Kitchen albuterol (PROVENTIL HFA;VENTOLIN HFA) 108 (90 BASE) MCG/ACT inhaler Inhale 2 puffs into the lungs every 6 (six) hours as needed for wheezing or shortness of breath.   Marland Kitchen albuterol (PROVENTIL) (2.5 MG/3ML) 0.083% nebulizer solution INHALE 1 VIAL VIA NEBULIZER THREE TIMES DAILY.   Marland Kitchen allopurinol  (ZYLOPRIM) 100 MG tablet Take 1 tablet (100 mg total) by mouth daily.   . benzonatate (TESSALON) 100 MG capsule Take 1 capsule (100 mg total) by mouth 2 (two) times daily as needed for cough.   . calcitRIOL (ROCALTROL) 0.25 MCG capsule Take 1 capsule by mouth 3 (three) times a week. 03/31/2016: Received from: External Pharmacy Received Sig: TAKE 1 TAB ON [place], AND FRIDAYS.  Marland Kitchen cholecalciferol (VITAMIN D) 1000 UNITS tablet Take 1,000 Units by mouth daily.   Marland Kitchen COUMADIN 5 MG tablet Take 2 tablets daily   . digoxin (LANOXIN) 0.125 MG tablet Take 1 tablet (0.125 mg total) by mouth every other day.   . docusate sodium (COLACE) 100 MG capsule Take 300 mg by mouth at bedtime.   Marland Kitchen ezetimibe-simvastatin (VYTORIN) 10-20 MG tablet Take 1 tablet by mouth 3  times weekly   . fluticasone furoate-vilanterol (BREO ELLIPTA) 100-25 MCG/INH AEPB Inhale 1 puff into the lungs daily.   . fluticasone furoate-vilanterol (BREO ELLIPTA) 100-25 MCG/INH AEPB Inhale 1 puff into the lungs daily.   . folic acid (FOLVITE) 1 MG tablet Take 1 tablet (1 mg total) by mouth daily.   . furosemide (LASIX) 40 MG tablet Take 1 tablet by mouth  daily   . gabapentin (NEURONTIN) 300 MG capsule Take 1 capsule (300 mg total) by mouth 2 (two) times daily.   . hydrALAZINE (APRESOLINE) 25 MG tablet take 1 tablet by mouth three times a day   . HYDROcodone-acetaminophen (NORCO/VICODIN) 5-325 MG tablet Take 1  tablet by mouth every 4 (four) hours as needed for moderate pain (Must last 30 days.  Do not take and drive a car or use machinery.).   Marland Kitchen isosorbide dinitrate (ISOCHRON) 40 MG CR tablet Take one-half tablet by  mouth twice a day   . losartan (COZAAR) 50 MG tablet Take 1 tablet by mouth  daily   . meclizine (ANTIVERT) 12.5 MG tablet Take 1 tablet (12.5 mg total) by mouth 2 (two) times daily as needed for dizziness.   . metolazone (ZAROXOLYN) 2.5 MG tablet Take 1 tablet (2.5 mg total) by mouth once a week.   . montelukast (SINGULAIR)  10 MG tablet Take 1 tablet (10 mg total) by mouth at bedtime.   . ondansetron (ZOFRAN) 4 MG tablet Take 1 tablet (4 mg total) by mouth daily.   [name] [name] (SYSTANE OP) Place 1 drop into both eyes daily as needed (dry eyes).   . potassium chloride SA (KLOR-CON M20) 20 MEQ tablet Take 1 tablet by mouth  daily   . PROAIR HFA 108 (90 BASE) MCG/ACT inhaler USE 2 PUFFS INTO THE LUNGS EVERY 6 HOURS AS NEEDED FOR WHEEZING OR SHORTNESS OF BREATH.   . SYNTHROID 75 MCG tablet Take 1 tablet by mouth  daily   . temazepam (RESTORIL) 30 MG capsule Take 30 mg by mouth at bedtime as needed for sleep.   . TOPROL XL 100 MG 24 hr tablet Take 1 tablet (100 mg total) by mouth daily. Take with or immediately following a meal.   . umeclidinium bromide (INCRUSE ELLIPTA) 62.5 MCG/INH AEPB Inhale 1 puff into the lungs daily.   Marland Kitchen azelastine (ASTELIN) 0.1 % nasal spray Place 2 sprays into both nostrils 2 (two) times daily. Use in each nostril as directed (Patient not taking: Reported on 05/05/2016)   . Diphenhyd-Hydrocort-Nystatin (FIRST-DUKES MOUTHWASH) SUSP 2 tsp three times daily, gargle, swish and swallow for 3 days, then as needed, up to 1 week total (Patient not taking: Reported on 05/05/2016)   . fluticasone (FLONASE) 50 MCG/ACT nasal spray instill 2 sprays into each nostril once daily (Patient not taking: Reported on 05/05/2016)   . Fluticasone-Salmeterol (ADVAIR DISKUS) 100-50 MCG/DOSE AEPB inhale 1 dose by mouth twice a day (Patient not taking: Reported on 05/05/2016)   . promethazine-dextromethorphan (PROMETHAZINE-DM) 6.25-15 MG/5ML syrup One teaspoon at bedtime as needed, for cough (Patient not taking: Reported on 05/05/2016)    Facility-Administered Encounter Medications as of 05/05/2016  Medication  . epoetin alfa (EPOGEN,PROCRIT) injection 40,000 Units    Functional Status:   In your present state of health, do you have any difficulty performing the following activities: 04/24/2016 04/24/2016   Hearing? N N  Vision? N N  Difficulty concentrating or making decisions? Tempie Donning  Walking or climbing stairs? Y Y  Dressing or bathing? Y Y  Doing errands, shopping? Tempie Donning  Preparing Food and eating ? N -  Using the Toilet? N -  In the past six months, have you accidently leaked urine? N -  Do you have problems with loss of bowel control? N -  Managing your Medications? N -  Managing your Finances? N -  Housekeeping or managing your Housekeeping? N -  Some recent data might be hidden    Fall/Depression Screening:    PHQ 2/9 Scores 04/24/2016 04/24/2016 04/17/2016 10/21/2015 07/09/2014 10/23/2013 04/08/2013  PHQ - 2 Score 2 2 1  0 0 0 0  PHQ- 9 Score 9 6 - - - - -  Assessment:   HF: Patient needs to start weighing again daily, Will need ongoing education around HF zones. Gave weight log to log daily weights, Gave EMMI education around low sodium diet, reviewed HF zones  Hypotensive:  BP reading low this visit, patient denies any lightheaded or dizziness. She states her BP is "usually real good" but is not sure of any numerical readings to compare.  Grief: Patient brother just passed away   Medication: Gave med box for medication management  Plan:  Plan to visit again next month. RNCM will send MD initial barrier letter, emphasize BP reading Patient will keep MD appointments. Patient to weigh daily and log weights  Royetta Probus E. Laymond Purser, RN, BSN, [place] [phone]

## 2016-05-08 DIAGNOSIS — M5416 Radiculopathy, lumbar region: Secondary | ICD-10-CM | POA: Diagnosis not present

## 2016-05-09 ENCOUNTER — Other Ambulatory Visit (HOSPITAL_COMMUNITY): Payer: Self-pay | Admitting: Surgery

## 2016-05-09 DIAGNOSIS — Z09 Encounter for follow-up examination after completed treatment for conditions other than malignant neoplasm: Secondary | ICD-10-CM

## 2016-05-09 DIAGNOSIS — D242 Benign neoplasm of left breast: Secondary | ICD-10-CM

## 2016-05-09 DIAGNOSIS — N6452 Nipple discharge: Secondary | ICD-10-CM

## 2016-05-10 ENCOUNTER — Ambulatory Visit (INDEPENDENT_AMBULATORY_CARE_PROVIDER_SITE_OTHER): Payer: Medicare Other | Admitting: *Deleted

## 2016-05-10 ENCOUNTER — Telehealth: Payer: Self-pay | Admitting: *Deleted

## 2016-05-10 DIAGNOSIS — Z5181 Encounter for therapeutic drug level monitoring: Secondary | ICD-10-CM

## 2016-05-10 DIAGNOSIS — Z954 Presence of other heart-valve replacement: Secondary | ICD-10-CM

## 2016-05-10 DIAGNOSIS — Z7901 Long term (current) use of anticoagulants: Secondary | ICD-10-CM | POA: Diagnosis not present

## 2016-05-10 DIAGNOSIS — Z952 Presence of prosthetic heart valve: Secondary | ICD-10-CM

## 2016-05-10 LAB — POCT INR: INR: 1.1

## 2016-05-10 NOTE — Telephone Encounter (Signed)
-----   Message from Erskine Emery, [facility] sent at 04/24/2016  4:41 PM EDT ----- Regarding: RE: Procedure with Lovenox Bridge She will need to be off for 5 days prior. And I will forward along any information about when the procedure is scheduled when I receive it. Thank you!  ----- Message ----- From: Malen Gauze, RN Sent: 04/24/2016   4:30 PM To: Erskine Emery, [facility] Subject: RE: Procedure with Lovenox [place]              Just let me know then date of her procedure when it is scheduled and how many days the MD wants her off coumadin and I will bridge her. ----- Message ----- From: Erskine Emery, [facility] Sent: 04/24/2016   4:24 PM To: Malen Gauze, RN Subject: Procedure with Lovenox [place] this patient will be having a procedure (unscheduled) with [facility] in the near future and will need to be bridged with Lovenox. Your next appt with her is 05/10/16.

## 2016-05-11 ENCOUNTER — Encounter (HOSPITAL_COMMUNITY): Payer: Medicare Other | Attending: Oncology

## 2016-05-11 ENCOUNTER — Encounter (HOSPITAL_COMMUNITY): Payer: Medicare Other | Attending: Internal Medicine

## 2016-05-11 ENCOUNTER — Encounter (HOSPITAL_COMMUNITY): Payer: Self-pay

## 2016-05-11 VITALS — BP 131/63 | HR 70 | Temp 98.0°F | Resp 18

## 2016-05-11 DIAGNOSIS — D638 Anemia in other chronic diseases classified elsewhere: Secondary | ICD-10-CM | POA: Diagnosis not present

## 2016-05-11 DIAGNOSIS — D631 Anemia in chronic kidney disease: Secondary | ICD-10-CM

## 2016-05-11 DIAGNOSIS — D649 Anemia, unspecified: Secondary | ICD-10-CM

## 2016-05-11 DIAGNOSIS — N189 Chronic kidney disease, unspecified: Secondary | ICD-10-CM

## 2016-05-11 DIAGNOSIS — E611 Iron deficiency: Secondary | ICD-10-CM | POA: Insufficient documentation

## 2016-05-11 DIAGNOSIS — N184 Chronic kidney disease, stage 4 (severe): Secondary | ICD-10-CM

## 2016-05-11 LAB — CBC
HCT: 34.1 % — ABNORMAL LOW (ref 36.0–46.0)
Hemoglobin: 10.9 g/dL — ABNORMAL LOW (ref 12.0–15.0)
MCH: 30.7 pg (ref 26.0–34.0)
MCHC: 32 g/dL (ref 30.0–36.0)
MCV: 96.1 fL (ref 78.0–100.0)
Platelets: 206 10*3/uL (ref 150–400)
RBC: 3.55 MIL/uL — ABNORMAL LOW (ref 3.87–5.11)
RDW: 15.9 % — ABNORMAL HIGH (ref 11.5–15.5)
WBC: 7.3 10*3/uL (ref 4.0–10.5)

## 2016-05-11 MED ORDER — EPOETIN ALFA 40000 UNIT/ML IJ SOLN
40000.0000 [IU] | Freq: Once | INTRAMUSCULAR | Status: AC
  Administered 2016-05-11: 40000 [IU] via SUBCUTANEOUS
  Filled 2016-05-11: qty 1

## 2016-05-11 NOTE — Patient Instructions (Signed)
[place] at [facility] Discharge Instructions  RECOMMENDATIONS MADE BY THE CONSULTANT AND ANY TEST RESULTS WILL BE SENT TO YOUR REFERRING PHYSICIAN.  Procrit injection today. Return as scheduled for lab work and injections. Return as scheduled for office visit.   Thank you for choosing [place] at [facility] to provide your oncology and hematology care.  To afford each patient quality time with our provider, please arrive at least 15 minutes before your scheduled appointment time.   Beginning January 23rd 2017 lab work for the [facility] will be done in the  Main lab at [facility] on 1st floor. If you have a lab appointment with the [facility] [place] please come in thru the  Main Entrance and check in at the main information desk  You need to re-schedule your appointment should you arrive 10 or more minutes late.  We strive to give you quality time with our providers, and arriving late affects you and other patients whose appointments are after yours.  Also, if you no show three or more times for appointments you may be dismissed from the clinic at the providers discretion.     Again, thank you for choosing [facility].  Our hope is that these requests will decrease the amount of time that you wait before being seen by our physicians.       _____________________________________________________________  Should you have questions after your visit to [facility], please contact our office at [phone] between the hours of 8:30 a.m. and 4:30 p.m.  Voicemails left after 4:30 p.m. will not be returned until the following business day.  For prescription refill requests, have your pharmacy contact our office.         Resources For Cancer Patients and their Caregivers ? American Cancer Society: Can assist with transportation, wigs, general needs, runs Look Good Feel Better.        608-154-3907 ? Cancer  Care: Provides financial assistance, online support groups, medication/co-pay assistance.  1-800-813-HOPE 202-363-3384) ? [place] Assists [place] cancer patients and their families through emotional , educational and financial support.  (343)219-1565 ? [place] DSS Where to apply for food stamps, Medicaid and utility assistance. 585-505-3599 ? [facility]: Transportation to medical appointments. 318-064-2535 ? Social Security Administration: May apply for disability if have a Stage IV cancer. 502-230-6101 (628)176-3038 ? [employer], Disability and Transit Services: Assists with nutrition, care and transit needs. [place] Support Programs: @10RELATIVEDAYS @ > Cancer Support Group  2nd Tuesday of the month 1pm-2pm, Journey Room  > Creative Journey  3rd Tuesday of the month 1130am-1pm, Journey Room  > Look Good Feel Better  1st Wednesday of the month 10am-12 noon, Journey Room (Call [place] to register [phone])

## 2016-05-11 NOTE — Progress Notes (Signed)
[name] [name] presents today for injection per the provider's orders.  Procrit administration without incident; see MAR for injection details.  Patient tolerated procedure well and without incident.  No questions or complaints noted at this time. 

## 2016-05-13 ENCOUNTER — Encounter (INDEPENDENT_AMBULATORY_CARE_PROVIDER_SITE_OTHER): Payer: Self-pay

## 2016-05-15 ENCOUNTER — Telehealth: Payer: Self-pay | Admitting: *Deleted

## 2016-05-15 ENCOUNTER — Ambulatory Visit (INDEPENDENT_AMBULATORY_CARE_PROVIDER_SITE_OTHER): Payer: Medicare Other | Admitting: *Deleted

## 2016-05-15 DIAGNOSIS — Z954 Presence of other heart-valve replacement: Secondary | ICD-10-CM | POA: Diagnosis not present

## 2016-05-15 DIAGNOSIS — Z7901 Long term (current) use of anticoagulants: Secondary | ICD-10-CM | POA: Diagnosis not present

## 2016-05-15 DIAGNOSIS — Z5181 Encounter for therapeutic drug level monitoring: Secondary | ICD-10-CM | POA: Diagnosis not present

## 2016-05-15 DIAGNOSIS — Z952 Presence of prosthetic heart valve: Secondary | ICD-10-CM

## 2016-05-15 LAB — POCT INR: INR: 2.1

## 2016-05-15 NOTE — Telephone Encounter (Signed)
Pt wanted earlier appt time today.  Moved up to 12:50pm.

## 2016-05-16 ENCOUNTER — Ambulatory Visit (HOSPITAL_COMMUNITY)
Admission: RE | Admit: 2016-05-16 | Discharge: 2016-05-16 | Disposition: A | Discharge status: Home / Self Care | Payer: Medicare Other | Source: Ambulatory Visit | Attending: Surgery | Admitting: Surgery

## 2016-05-16 ENCOUNTER — Telehealth: Payer: Self-pay | Admitting: Family Medicine

## 2016-05-16 ENCOUNTER — Other Ambulatory Visit: Payer: Self-pay

## 2016-05-16 DIAGNOSIS — Z09 Encounter for follow-up examination after completed treatment for conditions other than malignant neoplasm: Secondary | ICD-10-CM

## 2016-05-16 DIAGNOSIS — N6452 Nipple discharge: Secondary | ICD-10-CM | POA: Diagnosis not present

## 2016-05-16 DIAGNOSIS — N63 Unspecified lump in breast: Secondary | ICD-10-CM | POA: Insufficient documentation

## 2016-05-16 MED ORDER — LOSARTAN POTASSIUM 50 MG PO TABS: ORAL_TABLET | ORAL | 1 refills | Status: DC

## 2016-05-16 MED ORDER — TOPROL XL 100 MG PO TB24: 100.0000 mg | ORAL_TABLET | Freq: Every day | ORAL | 1 refills | Status: DC

## 2016-05-16 MED ORDER — HYDRALAZINE HCL 25 MG PO TABS: 25.0000 mg | ORAL_TABLET | Freq: Three times a day (TID) | ORAL | 3 refills | Status: DC

## 2016-05-16 NOTE — Telephone Encounter (Signed)
meds refilled 

## 2016-05-16 NOTE — Telephone Encounter (Signed)
[name] [name] is calling stating that she is needing blood pressure medication(s) refilled, please advise?

## 2016-05-19 ENCOUNTER — Other Ambulatory Visit: Payer: Self-pay | Admitting: Licensed Clinical Social Worker

## 2016-05-19 NOTE — Patient Outreach (Signed)
Assessment:  CSW spoke via phone with client on 05/19/16. CSW verified client identity. CSW received verbal permission from client on 05/19/16 for CSW to speak with client about current client needs. Client sees Dr. [name] as primary care provider. Client is receiving [facility] nursing support with RN Burgess Amor. Client said she lived alone. She said she has assistance from her cousins. She said she sometimes needs transport assistance to out of town medical appointments.  Client has Medicare. Client and CSW spoke of client care plan. CSW encouraged client to communicate with CSW in next 30 days to discuss community resources for transportation support/asssitance for client.  Client said she had her prescribed medications and is taking medications as prescribed. Client uses a cane or walker to help her ambulate. Client said she is still adjusting to death of her brother recently. She said she and her family members are trying to manage financial matters related to death of her brother.  CSW had previously encouraged client to call [employer] transport agency to talk with agency representative about transport rates for client to go to and from her [place], [place] medical appointments. CSW again discussed [employer] transport agency with client on 05/19/16.  CSW encouraged client and her family to call [employer] transport agency to discuss with agency representative transport rates for client to go to and from her [place], [place] medical appointments.   Client said she has appointment with Dr. [name] in October of 2017. Client said she has some other medical appointments in near future but that her niece will transport her to and from these scheduled medical appointments. Client becomes fatigued occasionally. She said she uses oxygen as prescribed at night to help her breathe. CSW encouraged client to call CSW or RN Burgess Amor as needed for [facility] program support. Client was appreciative of call from [place] on  05/19/16.    Plan:  Client to communicate with CSW in next 30 days to discuss community resources for transportation support/assistance for client.  CSW to call client in 4 weeks to assess needs of client at that time.  [name].Jonaven Hilgers MSW, LCSW Licensed Clinical Social Worker [facility] Care Management [phone]

## 2016-05-22 DIAGNOSIS — N6452 Nipple discharge: Secondary | ICD-10-CM | POA: Diagnosis not present

## 2016-05-25 ENCOUNTER — Encounter: Payer: Self-pay | Admitting: Orthopaedic Surgery

## 2016-05-25 ENCOUNTER — Ambulatory Visit (INDEPENDENT_AMBULATORY_CARE_PROVIDER_SITE_OTHER): Payer: Medicare Other

## 2016-05-25 ENCOUNTER — Ambulatory Visit (INDEPENDENT_AMBULATORY_CARE_PROVIDER_SITE_OTHER): Payer: Medicare Other | Admitting: Orthopaedic Surgery

## 2016-05-25 VITALS — BP 122/69 | HR 75 | Ht 66.5 in | Wt 216.0 lb

## 2016-05-25 DIAGNOSIS — M25561 Pain in right knee: Secondary | ICD-10-CM | POA: Diagnosis not present

## 2016-05-25 MED ORDER — HYDROCODONE-ACETAMINOPHEN 5-325 MG PO TABS: 1.0000 | ORAL_TABLET | ORAL | 0 refills | Status: DC | PRN

## 2016-05-25 NOTE — Progress Notes (Signed)
Patient [name] [name], female DOB:20-Jul-1932, 82 y.o. [id number]  Chief Complaint  Patient presents with  . Follow-up    Right knee and back pain    HPI  [name] [name] is a 82 y.o. female who has chronic right knee pain and lower back pain.  Her right knee is more painful today.  She has popping and swelling and feelings it may give way.  She has no trauma,no redness, no locking.  She has tried rubs, ice, heat.  She uses a cane.  Her lower back is chronically painful. She has no new trauma.  She is doing her exercises and taking her medicine. She has no bowel or bladder problems or paresthesias.   HPI  Body mass index is 34.34 kg/m.  ROS  Review of Systems  HENT: Negative for congestion.   Respiratory: Negative for cough and shortness of breath.   Cardiovascular: Negative for chest pain and leg swelling.  Endocrine: Positive for cold intolerance.  Musculoskeletal: Positive for arthralgias, back pain, gait problem and joint swelling.  Allergic/Immunologic: Positive for environmental allergies.  Psychiatric/Behavioral: The patient is nervous/anxious.     Past Medical History:  Diagnosis Date  . Adenomatous polyp of colon 12/16/2002   Dr. [name]/[facility]  . Allergy   . Cataract   . CHB (complete heart block) ([place]) 10/07/2014      . CHF (congestive heart failure) ([place])       . Chronic kidney disease, stage I 2006  . Constipation       . COPD (chronic obstructive pulmonary disease) ([place])       . DJD (degenerative joint disease) of lumbar spine   . DM (diabetes mellitus) ([place]) 2006  . Esophageal reflux   . Glaucoma   . Gout       . Heart murmur   . Hyperlipemia   . IDA (iron deficiency anemia)    Parenteral iron/[name]  . Insomnia       . Non-ischemic cardiomyopathy ([place])    a. s/p MDT CRTD  . Obesity, unspecified   . Other and unspecified hyperlipidemia   . Oxygen deficiency 2014   nocturnal;  .  Spondylolisthesis   . Spondylosis   . Thyroid cancer ([place])    remote thyroidectomy, no recurrence, pt denies in 2016 thyroid cancer  . Unspecified hypothyroidism       . 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 Vertigo         Past Surgical History:  Procedure Laterality Date  . AORTIC AND MITRAL VALVE REPLACEMENT  2001  . BI-VENTRICULAR IMPLANTABLE CARDIOVERTER DEFIBRILLATOR UPGRADE N/A 01/18/2015   Procedure: BI-VENTRICULAR IMPLANTABLE CARDIOVERTER DEFIBRILLATOR UPGRADE;  Surgeon: Evans Lance, MD;  Location: [facility] CATH LAB;  Service: Cardiovascular;  Laterality: N/A;  . CARDIAC CATHETERIZATION    . CARDIAC VALVE REPLACEMENT    . COLONOSCOPY  06/12/2007   [place]- Normal rectum/Normal colon  . COLONOSCOPY  12/16/02   small hemorrhoids  . COLONOSCOPY  06/20/2012   Procedure: COLONOSCOPY;  Surgeon: Daneil Dolin, MD;  Location: [facility];  Service: Endoscopy;  Laterality: N/A;  10:45  . defibrillator implanted 2006    . DOPPLER ECHOCARDIOGRAPHY  2012  . DOPPLER ECHOCARDIOGRAPHY  05,06,07,08,09,2011  . ESOPHAGOGASTRODUODENOSCOPY  06/12/2007   Rourk- normal esophagus, small hiatal hernia, otherwise normal stomach, D1, D2  . EYE SURGERY Right 2005  . EYE SURGERY Left 2006  . ICD LEAD REMOVAL Left 02/08/2015   Procedure:  ICD LEAD REMOVAL;  Surgeon: Evans Lance, MD;  Location: [facility];  Service: Cardiovascular;  Laterality: Left;  "Will plan extraction and insertion of a BiV PM"  **Dr. Roxan Hockey backing up case**  . IMPLANTABLE CARDIOVERTER DEFIBRILLATOR (ICD) GENERATOR CHANGE Left 02/08/2015   Procedure: ICD GENERATOR CHANGE;  Surgeon: Evans Lance, MD;  Location: [facility];  Service: Cardiovascular;  Laterality: Left;  . INSERT / REPLACE / REMOVE PACEMAKER    . PACEMAKER INSERTION  May 2016  . pacemaker placed  2004  . right breast cyst removed     benign   . right cataract removed     2005  . THYROIDECTOMY      Family History  Problem Relation Age  of Onset  . Cancer Mother     behind pancres  . Heart disease Father   . Heart disease Sister     Heart attack  . Heart disease Brother   . Diabetes Brother   . Heart disease Brother   . Diabetes Brother   . Hypertension Brother   . Cancer Brother     lung    Social History Social History  Substance Use Topics  . Smoking status: Former Smoker    Packs/day: 0.75    Years: 40.00    Types: Cigarettes    Quit date: 03/08/1987  . Smokeless tobacco: Never Used  . Alcohol use No      Current Outpatient Prescriptions  Medication Sig Dispense Refill  . acetaminophen (TYLENOL) 500 MG tablet Take 500 mg by mouth every 6 (six) hours as needed (pain).    Marland Kitchen albuterol (PROVENTIL HFA;VENTOLIN HFA) 108 (90 BASE) MCG/ACT inhaler Inhale 2 puffs into the lungs every 6 (six) hours as needed for wheezing or shortness of breath. 1 Inhaler 3  . albuterol (PROVENTIL) (2.5 MG/3ML) 0.083% nebulizer solution INHALE 1 VIAL VIA NEBULIZER THREE TIMES DAILY. 300 mL 2  . allopurinol (ZYLOPRIM) 100 MG tablet Take 1 tablet (100 mg total) by mouth daily. 90 tablet 1  . azelastine (ASTELIN) 0.1 % nasal spray Place 2 sprays into both nostrils 2 (two) times daily. Use in each nostril as directed (Patient not taking: Reported on 05/05/2016) 30 mL 12  . benzonatate (TESSALON) 100 MG capsule Take 1 capsule (100 mg total) by mouth 2 (two) times daily as needed for cough. 20 capsule 0  . calcitRIOL (ROCALTROL) 0.25 MCG capsule Take 1 capsule by mouth 3 (three) times a week.  0  . cholecalciferol (VITAMIN D) 1000 UNITS tablet Take 1,000 Units by mouth daily.    Marland Kitchen COUMADIN 5 MG tablet Take 2 tablets daily 180 tablet 3  . digoxin (LANOXIN) 0.125 MG tablet Take 1 tablet (0.125 mg total) by mouth every other day. 90 tablet 3  . Diphenhyd-Hydrocort-Nystatin (FIRST-DUKES MOUTHWASH) SUSP 2 tsp three times daily, gargle, swish and swallow for 3 days, then as needed, up to 1 week total (Patient not taking: Reported on 05/05/2016)  237 mL 0  . docusate sodium (COLACE) 100 MG capsule Take 300 mg by mouth at bedtime.    Marland Kitchen ezetimibe-simvastatin (VYTORIN) 10-20 MG tablet Take 1 tablet by mouth 3  times weekly 7 tablet 0  . fluticasone (FLONASE) 50 MCG/ACT nasal spray instill 2 sprays into each nostril once daily (Patient not taking: Reported on 05/05/2016) 16 g 2  . fluticasone furoate-vilanterol (BREO ELLIPTA) 100-25 MCG/INH AEPB Inhale 1 puff into the lungs daily.    . fluticasone furoate-vilanterol (BREO ELLIPTA) 100-25 MCG/INH AEPB Inhale 1  puff into the lungs daily.    . Fluticasone-Salmeterol (ADVAIR DISKUS) 100-50 MCG/DOSE AEPB inhale 1 dose by mouth twice a day (Patient not taking: Reported on 05/05/2016) [id number] each 3  . folic acid (FOLVITE) 1 MG tablet Take 1 tablet (1 mg total) by mouth daily. 100 tablet 3  . furosemide (LASIX) 40 MG tablet Take 1 tablet by mouth  daily 90 tablet 1  . gabapentin (NEURONTIN) 300 MG capsule Take 1 capsule (300 mg total) by mouth 2 (two) times daily. 180 capsule 1  . hydrALAZINE (APRESOLINE) 25 MG tablet Take 1 tablet (25 mg total) by mouth 3 (three) times daily. 270 tablet 3  . HYDROcodone-acetaminophen (NORCO/VICODIN) 5-325 MG tablet Take 1 tablet by mouth every 4 (four) hours as needed for moderate pain (Must last 30 days.  Do not take and drive a car or use machinery.). 120 tablet 0  . isosorbide dinitrate (ISOCHRON) 40 MG CR tablet Take one-half tablet by  mouth twice a day 90 tablet 6  . losartan (COZAAR) 50 MG tablet Take 1 tablet by mouth  daily 90 tablet 1  . meclizine (ANTIVERT) 12.5 MG tablet Take 1 tablet (12.5 mg total) by mouth 2 (two) times daily as needed for dizziness. 30 tablet 2  . metolazone (ZAROXOLYN) 2.5 MG tablet Take 1 tablet (2.5 mg total) by mouth once a week. 2 tablet 0  . montelukast (SINGULAIR) 10 MG tablet Take 1 tablet (10 mg total) by mouth at bedtime. 90 tablet 1  . ondansetron (ZOFRAN) 4 MG tablet Take 1 tablet (4 mg total) by mouth daily. 90 tablet 0  .  Polyethyl [name] (SYSTANE OP) Place 1 drop into both eyes daily as needed (dry eyes).    . potassium chloride SA (KLOR-CON M20) 20 MEQ tablet Take 1 tablet by mouth  daily 90 tablet 1  . PROAIR HFA 108 (90 Base) MCG/ACT inhaler inhale 2 puffs by mouth every 6 hours if needed for wheezing or shortness of breath 8.5 Inhaler 3  . promethazine-dextromethorphan (PROMETHAZINE-DM) 6.25-15 MG/5ML syrup One teaspoon at bedtime as needed, for cough (Patient not taking: Reported on 05/05/2016) 180 mL 0  . SYNTHROID 75 MCG tablet Take 1 tablet by mouth  daily 30 tablet 1  . temazepam (RESTORIL) 30 MG capsule Take 30 mg by mouth at bedtime as needed for sleep.    . TOPROL XL 100 MG 24 hr tablet Take 1 tablet (100 mg total) by mouth daily. Take with or immediately following a meal. 90 tablet 1  . umeclidinium bromide (INCRUSE ELLIPTA) 62.5 MCG/INH AEPB Inhale 1 puff into the lungs daily.     No current facility-administered medications for this visit.    Facility-Administered Medications Ordered in Other Visits  Medication Dose Route Frequency Provider Last Rate Last Dose  . epoetin alfa (EPOGEN,PROCRIT) injection 40,000 Units  40,000 Units Subcutaneous Once Farrel Gobble, MD         Physical Exam  Blood pressure 122/69, pulse 75, height 5' 6.5" (1.689 m), weight 216 lb (98 kg).  Constitutional: overall normal hygiene, normal nutrition, well developed, normal grooming, normal body habitus. Assistive device:cane  Musculoskeletal: gait and station Limp right, muscle tone and strength are normal, no tremors or atrophy is present.  .  Neurological: coordination overall normal.  Deep tendon reflex/nerve stretch intact.  Sensation normal.  Cranial nerves II-XII intact.   Skin:   normal overall no scars, lesions, ulcers or rashes. No psoriasis.  Psychiatric: Alert and oriented x 3.  Recent memory intact, remote memory unclear.  Normal mood and affect. Well groomed.  Good eye  contact.  Cardiovascular: overall no swelling, no varicosities, no edema bilaterally, normal temperatures of the legs and arms, no clubbing, cyanosis and good capillary refill.  Lymphatic: palpation is normal.  Spine/Pelvis examination:  Inspection:  Overall, sacoiliac joint benign and hips nontender; without crepitus or defects.   Thoracic spine inspection: Alignment normal without kyphosis present   Lumbar spine inspection:  Alignment  with normal lumbar lordosis, without scoliosis apparent.   Thoracic spine palpation:  without tenderness of spinal processes   Lumbar spine palpation: with tenderness of lumbar area; without tightness of lumbar muscles    Range of Motion:   Lumbar flexion, forward flexion is 40 without pain or tenderness    Lumbar extension is 10 without pain or tenderness   Left lateral bend is Normal  without pain or tenderness   Right lateral bend is Normal without pain or tenderness   Straight leg raising is Normal   Strength & tone: Normal   Stability overall normal stability   The right lower extremity is examined:  Inspection:  Thigh:  Non-tender and no defects  Knee has swelling 1+ effusion.                        Joint tenderness is present                        Patient is tender over the medial joint line  Lower Leg:  Has normal appearance and no tenderness or defects  Ankle:  Non-tender and no defects  Foot:  Non-tender and no defects Range of Motion:  Knee:  Range of motion is: 0-100                        Crepitus is  present  Ankle:  Range of motion is normal. Strength and Tone:  The right lower extremity has normal strength and tone. Stability:  Knee:  The knee is stable.  Ankle:  The ankle is stable.    The patient has been educated about the nature of the problem(s) and counseled on treatment options.  The patient appeared to understand what I have discussed and is in agreement with it.  Encounter Diagnosis  Name Primary?  .  Right knee pain Yes    PLAN Call if any problems.  Precautions discussed.  Continue current medications.   Return to clinic 1 month   Electronically Signed [name] Kava, MD 8/31/20174:41 PM

## 2016-05-25 NOTE — Progress Notes (Signed)
DG knee

## 2016-05-26 ENCOUNTER — Other Ambulatory Visit: Payer: Self-pay

## 2016-05-26 ENCOUNTER — Other Ambulatory Visit: Payer: Self-pay | Admitting: *Deleted

## 2016-05-26 ENCOUNTER — Encounter: Payer: Self-pay | Admitting: *Deleted

## 2016-05-26 MED ORDER — FLUTICASONE PROPIONATE 50 MCG/ACT NA SUSP: NASAL | 1 refills | Status: DC

## 2016-05-26 MED ORDER — EZETIMIBE-SIMVASTATIN 10-20 MG PO TABS: ORAL_TABLET | ORAL | 1 refills | Status: DC

## 2016-05-26 MED ORDER — SYNTHROID 75 MCG PO TABS: ORAL_TABLET | ORAL | 1 refills | Status: DC

## 2016-05-26 NOTE — Patient Outreach (Signed)
[facility]) Care Management   05/26/2016  [name] [name] [birth year] [id number]  [name] [name] is an 82 y.o. female  Subjective:  Patient not home at start of visit, was at pharmacy getting medications.  Patient reports she saw Ortho yesterday for her knee. Patient states that she is still dealing with brother's death and states she has not been weighing and logging weights daily. Patient states that she had a nose bleed yesterday, states she feels it is from dry nose from oxygen use at night.  Patient states she needs some refills on medications, will let MD know Patient was referred to urologist, patient had to rescheduled due to death, patient is unsure if she will keep appointment, states she had one episode of blood in urine but it has resolved. Objective:   Review of Systems  HENT: Negative.   Eyes: Negative.   Respiratory: Negative.   Gastrointestinal: Negative.   Genitourinary: Negative.   Musculoskeletal: Positive for back pain and joint pain.  Skin: Negative.   Neurological: Negative.   Endo/Heme/Allergies: Negative.   Psychiatric/Behavioral: Negative.    BP 122/60   Pulse 72   Resp 20   Wt 216 lb (98 kg)   BMI 34.34 kg/m  Physical Exam  Constitutional: She is oriented to person, place, and time. She appears well-developed.  Cardiovascular: Normal rate.   Valve click  Respiratory: Effort normal.  GI: Soft. Bowel sounds are normal.  Musculoskeletal: Normal range of motion.  Neurological: She is alert and oriented to person, place, and time.  Skin: Skin is warm and dry.    Encounter Medications:   Outpatient Encounter Prescriptions as of 05/26/2016  Medication Sig Note  . acetaminophen (TYLENOL) 500 MG tablet Take 500 mg by mouth every 6 (six) hours as needed (pain).   Marland Kitchen albuterol (PROVENTIL HFA;VENTOLIN HFA) 108 (90 BASE) MCG/ACT inhaler Inhale 2 puffs into the lungs every 6 (six) hours as needed for wheezing or shortness of breath.   Marland Kitchen  albuterol (PROVENTIL) (2.5 MG/3ML) 0.083% nebulizer solution INHALE 1 VIAL VIA NEBULIZER THREE TIMES DAILY.   Marland Kitchen allopurinol (ZYLOPRIM) 100 MG tablet Take 1 tablet (100 mg total) by mouth daily.   Marland Kitchen azelastine (ASTELIN) 0.1 % nasal spray Place 2 sprays into both nostrils 2 (two) times daily. Use in each nostril as directed (Patient not taking: Reported on 05/05/2016)   . benzonatate (TESSALON) 100 MG capsule Take 1 capsule (100 mg total) by mouth 2 (two) times daily as needed for cough.   . calcitRIOL (ROCALTROL) 0.25 MCG capsule Take 1 capsule by mouth 3 (three) times a week. 03/31/2016: Received from: External Pharmacy Received Sig: TAKE 1 TAB ON [place], AND FRIDAYS.  Marland Kitchen cholecalciferol (VITAMIN D) 1000 UNITS tablet Take 1,000 Units by mouth daily.   Marland Kitchen COUMADIN 5 MG tablet Take 2 tablets daily   . digoxin (LANOXIN) 0.125 MG tablet Take 1 tablet (0.125 mg total) by mouth every other day.   . Diphenhyd-Hydrocort-Nystatin (FIRST-DUKES MOUTHWASH) SUSP 2 tsp three times daily, gargle, swish and swallow for 3 days, then as needed, up to 1 week total (Patient not taking: Reported on 05/05/2016)   . docusate sodium (COLACE) 100 MG capsule Take 300 mg by mouth at bedtime.   Marland Kitchen ezetimibe-simvastatin (VYTORIN) 10-20 MG tablet Take 1 tablet by mouth 3  times weekly   . fluticasone (FLONASE) 50 MCG/ACT nasal spray instill 2 sprays into each nostril once daily (Patient not taking: Reported on 05/05/2016)   . fluticasone furoate-vilanterol (BREO ELLIPTA)  100-25 MCG/INH AEPB Inhale 1 puff into the lungs daily.   . fluticasone furoate-vilanterol (BREO ELLIPTA) 100-25 MCG/INH AEPB Inhale 1 puff into the lungs daily.   . Fluticasone-Salmeterol (ADVAIR DISKUS) 100-50 MCG/DOSE AEPB inhale 1 dose by mouth twice a day (Patient not taking: Reported on 05/05/2016)   . folic acid (FOLVITE) 1 MG tablet Take 1 tablet (1 mg total) by mouth daily.   . furosemide (LASIX) 40 MG tablet Take 1 tablet by mouth  daily   .  gabapentin (NEURONTIN) 300 MG capsule Take 1 capsule (300 mg total) by mouth 2 (two) times daily.   . hydrALAZINE (APRESOLINE) 25 MG tablet Take 1 tablet (25 mg total) by mouth 3 (three) times daily.   Marland Kitchen HYDROcodone-acetaminophen (NORCO/VICODIN) 5-325 MG tablet Take 1 tablet by mouth every 4 (four) hours as needed for moderate pain (Must last 30 days.  Do not take and drive a car or use machinery.).   Marland Kitchen isosorbide dinitrate (ISOCHRON) 40 MG CR tablet Take one-half tablet by  mouth twice a day   . losartan (COZAAR) 50 MG tablet Take 1 tablet by mouth  daily   . meclizine (ANTIVERT) 12.5 MG tablet Take 1 tablet (12.5 mg total) by mouth 2 (two) times daily as needed for dizziness.   . metolazone (ZAROXOLYN) 2.5 MG tablet Take 1 tablet (2.5 mg total) by mouth once a week.   . montelukast (SINGULAIR) 10 MG tablet Take 1 tablet (10 mg total) by mouth at bedtime.   . ondansetron (ZOFRAN) 4 MG tablet Take 1 tablet (4 mg total) by mouth daily.   [name] [name] (SYSTANE OP) Place 1 drop into both eyes daily as needed (dry eyes).   . potassium chloride SA (KLOR-CON M20) 20 MEQ tablet Take 1 tablet by mouth  daily   . PROAIR HFA 108 (90 Base) MCG/ACT inhaler inhale 2 puffs by mouth every 6 hours if needed for wheezing or shortness of breath   . promethazine-dextromethorphan (PROMETHAZINE-DM) 6.25-15 MG/5ML syrup One teaspoon at bedtime as needed, for cough (Patient not taking: Reported on 05/05/2016)   . SYNTHROID 75 MCG tablet Take 1 tablet by mouth  daily   . temazepam (RESTORIL) 30 MG capsule Take 30 mg by mouth at bedtime as needed for sleep.   . TOPROL XL 100 MG 24 hr tablet Take 1 tablet (100 mg total) by mouth daily. Take with or immediately following a meal.   . umeclidinium bromide (INCRUSE ELLIPTA) 62.5 MCG/INH AEPB Inhale 1 puff into the lungs daily.    Facility-Administered Encounter Medications as of 05/26/2016  Medication  . epoetin alfa (EPOGEN,PROCRIT) injection 40,000 Units     Functional Status:   In your present state of health, do you have any difficulty performing the following activities: 04/24/2016 04/24/2016  Hearing? N N  Vision? N N  Difficulty concentrating or making decisions? Tempie Donning  Walking or climbing stairs? Y Y  Dressing or bathing? Y Y  Doing errands, shopping? Tempie Donning  Preparing Food and eating ? N -  Using the Toilet? N -  In the past six months, have you accidently leaked urine? N -  Do you have problems with loss of bowel control? N -  Managing your Medications? N -  Managing your Finances? N -  Housekeeping or managing your Housekeeping? N -  Some recent data might be hidden    Fall/Depression Screening:    PHQ 2/9 Scores 04/24/2016 04/24/2016 04/17/2016 10/21/2015 07/09/2014 10/23/2013 04/08/2013  PHQ -  2 Score 2 2 1  0 0 0 0  PHQ- 9 Score 9 6 - - - - -    Assessment:   HF: patient needs to start weighing daily. Reviewed symptoms to monitor  Medication: will let Md know about refills. Educated patient to not use tylenol with new pain medications. Reinforced need to monitor for bleeding due to coumadin use and report if continues either the nose bleed or the blood in urine.   Multiple providers: Instructed patient to keep upcoming appointment with Urologist   Plan:   [facility] CM Care Plan Problem One   Flowsheet Row Most Recent Value  Care Plan Problem One  Knowledge deficit related to HF  Role Documenting the Problem One  Care Management [place] for Problem One  Active  [facility] Long Term Goal (31-90 days)  Patient will become familiar with HF disease process and be able to verbalize HF zones for self management over the next 31 days  [facility] CM Short Term Goal #1 (0-30 days)  Client will log daily weights over the next 31 days  [facility] CM Short Term Goal #1 Start Date  05/05/16  [facility] CM Short Term Goal #2 (0-30 days)  Client will be able to recognize yellow HF zone over the next 21 days  [facility] CM Short Term Goal #2 Start Date  05/05/16      RNCM will let MD know about medication refill needs Plan to visit again next month Patient to call with new issues Mary E. Laymond Purser, RN, BSN, [place] [phone]

## 2016-05-31 ENCOUNTER — Encounter (HOSPITAL_COMMUNITY): Payer: Medicare Other | Attending: Hematology & Oncology

## 2016-05-31 ENCOUNTER — Telehealth (HOSPITAL_COMMUNITY): Payer: Self-pay | Admitting: *Deleted

## 2016-05-31 ENCOUNTER — Other Ambulatory Visit: Payer: Self-pay | Admitting: *Deleted

## 2016-05-31 ENCOUNTER — Encounter (HOSPITAL_COMMUNITY): Payer: Medicare Other | Attending: Internal Medicine | Admitting: Oncology

## 2016-05-31 ENCOUNTER — Encounter (HOSPITAL_COMMUNITY): Payer: Medicare Other

## 2016-05-31 ENCOUNTER — Encounter (HOSPITAL_COMMUNITY): Payer: Self-pay | Admitting: Oncology

## 2016-05-31 VITALS — BP 129/57 | HR 70 | Temp 99.1°F | Resp 20

## 2016-05-31 VITALS — Ht 67.0 in | Wt 218.0 lb

## 2016-05-31 DIAGNOSIS — E611 Iron deficiency: Secondary | ICD-10-CM | POA: Insufficient documentation

## 2016-05-31 DIAGNOSIS — N184 Chronic kidney disease, stage 4 (severe): Secondary | ICD-10-CM

## 2016-05-31 DIAGNOSIS — N189 Chronic kidney disease, unspecified: Secondary | ICD-10-CM | POA: Diagnosis not present

## 2016-05-31 DIAGNOSIS — D631 Anemia in chronic kidney disease: Secondary | ICD-10-CM | POA: Diagnosis not present

## 2016-05-31 DIAGNOSIS — Z952 Presence of prosthetic heart valve: Secondary | ICD-10-CM | POA: Diagnosis not present

## 2016-05-31 DIAGNOSIS — D649 Anemia, unspecified: Secondary | ICD-10-CM

## 2016-05-31 DIAGNOSIS — D638 Anemia in other chronic diseases classified elsewhere: Secondary | ICD-10-CM | POA: Insufficient documentation

## 2016-05-31 LAB — CBC
HCT: 32.9 % — ABNORMAL LOW (ref 36.0–46.0)
Hemoglobin: 10.5 g/dL — ABNORMAL LOW (ref 12.0–15.0)
MCH: 30.4 pg (ref 26.0–34.0)
MCHC: 31.9 g/dL (ref 30.0–36.0)
MCV: 95.4 fL (ref 78.0–100.0)
Platelets: 170 10*3/uL (ref 150–400)
RBC: 3.45 MIL/uL — ABNORMAL LOW (ref 3.87–5.11)
RDW: 16.1 % — ABNORMAL HIGH (ref 11.5–15.5)
WBC: 6 10*3/uL (ref 4.0–10.5)

## 2016-05-31 LAB — FERRITIN: Ferritin: 394 ng/mL — ABNORMAL HIGH (ref 11–307)

## 2016-05-31 LAB — BASIC METABOLIC PANEL
Anion gap: 7 (ref 5–15)
BUN: 56 mg/dL — ABNORMAL HIGH (ref 6–20)
CO2: 28 mmol/L (ref 22–32)
Calcium: 9.4 mg/dL (ref 8.9–10.3)
Chloride: 100 mmol/L — ABNORMAL LOW (ref 101–111)
Creatinine, Ser: 1.47 mg/dL — ABNORMAL HIGH (ref 0.44–1.00)
GFR calc Af Amer: 37 mL/min — ABNORMAL LOW (ref >60)
GFR calc non Af Amer: 32 mL/min — ABNORMAL LOW (ref >60)
Glucose, Bld: 94 mg/dL (ref 65–99)
Potassium: 4.4 mmol/L (ref 3.5–5.1)
Sodium: 135 mmol/L (ref 135–145)

## 2016-05-31 MED ORDER — EPOETIN ALFA 40000 UNIT/ML IJ SOLN
INTRAMUSCULAR | Status: AC
  Filled 2016-05-31: qty 1

## 2016-05-31 MED ORDER — EPOETIN ALFA 40000 UNIT/ML IJ SOLN
40000.0000 [IU] | Freq: Once | INTRAMUSCULAR | Status: AC
  Administered 2016-05-31: 40000 [IU] via SUBCUTANEOUS

## 2016-05-31 NOTE — Patient Outreach (Signed)
Opened in error

## 2016-05-31 NOTE — Progress Notes (Signed)
[name] Nakayama, MD [address], [address] [place] [address]  Anemia in chronic renal disease - Plan: Ferritin, Ferritin, Comprehensive metabolic panel, Ferritin  CKD (chronic kidney disease), stage IV (HCC) - Plan: Basic metabolic panel, Comprehensive metabolic panel  CURRENT THERAPY: Procrit 40,000 units every 3 weeks  INTERVAL HISTORY: [name] [name] 82 y.o. 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  She is doing well hematologically.  She has some primary care complaints which she will address with Dr. [name]: numbness/burning in feet/fatigue/weakness/itching/hair loss.  Her brother recently passed and she notes that he peacefully passed away.  She notes that her medical bills are causing a concern for her.    She asks about decreasing her frequency of Procrit to every 4 weeks instead of every 3 weeks.  Based upon her hemoglobin I would not advise changing her Procrit frequency at this time.  She agrees as she wants to feel the best that she can.  Review of Systems  Constitutional: Positive for malaise/fatigue. Negative for chills, fever and weight loss.  HENT: Negative.   Eyes: Negative.   Respiratory: Negative.   Cardiovascular: Negative.   Gastrointestinal: Negative.   Genitourinary: Negative.   Musculoskeletal: Negative.   Skin: Negative.   Neurological: Negative.   Endo/Heme/Allergies: Negative.   Psychiatric/Behavioral: Negative.     Past Medical History:  Diagnosis Date  . Adenomatous polyp of colon 12/16/2002   Dr. [name]/[facility]  . Allergy   . Cataract   . CHB (complete heart block) ([place]) 10/07/2014      . CHF (congestive heart failure) ([place])       . Chronic kidney  disease, stage I 2006  . Constipation       . COPD (chronic obstructive pulmonary disease) ([place])       . DJD (degenerative joint disease) of lumbar spine   . DM (diabetes mellitus) ([place]) 2006  . Esophageal reflux   . Glaucoma   . Gout       . Heart murmur   . Hyperlipemia   . IDA (iron deficiency anemia)    Parenteral iron/[name]  . Insomnia       . Non-ischemic cardiomyopathy ([place])    a. s/p MDT CRTD  . Obesity, unspecified   . Other and unspecified hyperlipidemia   . Oxygen deficiency 2014   nocturnal;  . Spondylolisthesis   . Spondylosis   . Thyroid cancer ([place])    remote thyroidectomy, no recurrence, pt denies in 2016 thyroid cancer  . Unspecified hypothyroidism       . 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 Vertigo         Past Surgical History:  Procedure Laterality Date  . AORTIC AND MITRAL VALVE REPLACEMENT  2001  . BI-VENTRICULAR IMPLANTABLE CARDIOVERTER DEFIBRILLATOR UPGRADE N/A 01/18/2015   Procedure: BI-VENTRICULAR IMPLANTABLE CARDIOVERTER DEFIBRILLATOR UPGRADE;  Surgeon: Evans Lance, MD;  Location: [facility] CATH LAB;  Service: Cardiovascular;  Laterality: N/A;  . CARDIAC CATHETERIZATION    . CARDIAC VALVE REPLACEMENT    . COLONOSCOPY  06/12/2007   [place]- Normal rectum/Normal colon  . COLONOSCOPY  12/16/02   small hemorrhoids  . COLONOSCOPY  06/20/2012   Procedure: COLONOSCOPY;  Surgeon: Daneil Dolin, MD;  [facility];  Service: Endoscopy;  Laterality: N/A;  10:45  . defibrillator implanted 2006    . DOPPLER ECHOCARDIOGRAPHY  2012  . DOPPLER ECHOCARDIOGRAPHY  05,06,07,08,09,2011  . ESOPHAGOGASTRODUODENOSCOPY  06/12/2007   Rourk- normal esophagus, small hiatal hernia, otherwise normal stomach, D1, D2  . EYE SURGERY Right 2005  . EYE SURGERY Left 2006  . ICD LEAD REMOVAL Left 02/08/2015   Procedure: ICD LEAD REMOVAL;  Surgeon: Evans Lance, MD;  Location: [facility] OR;  Service: Cardiovascular;  Laterality: Left;   "Will plan extraction and insertion of a BiV PM"  **Dr. Roxan Hockey backing up case**  . IMPLANTABLE CARDIOVERTER DEFIBRILLATOR (ICD) GENERATOR CHANGE Left 02/08/2015   Procedure: ICD GENERATOR CHANGE;  Surgeon: Evans Lance, MD;  Location: [facility];  Service: Cardiovascular;  Laterality: Left;  . INSERT / REPLACE / REMOVE PACEMAKER    . PACEMAKER INSERTION  May 2016  . pacemaker placed  2004  . right breast cyst removed     benign   . right cataract removed     2005  . THYROIDECTOMY      Family History  Problem Relation Age of Onset  . Cancer Mother     behind pancres  . Heart disease Father   . Heart disease Sister     Heart attack  . Heart disease Brother   . Diabetes Brother   . Heart disease Brother   . Diabetes Brother   . Hypertension Brother   . Cancer Brother     lung    Social History   Social History  . Marital status: Widowed    Spouse name: N/A  . Number of children: 0  . Years of education: N/A   Occupational History  . retired Retired   Social History Main Topics  . Smoking status: Former Smoker    Packs/day: 0.75    Years: 40.00    Types: Cigarettes    Quit date: 03/08/1987  . Smokeless tobacco: Never Used  . Alcohol use No  . Drug use: No  . Sexual activity: Not Currently   Other Topics Concern  . None   Social History Narrative   From [place] (2004)     PHYSICAL EXAMINATION  ECOG PERFORMANCE STATUS: 1 - Symptomatic but completely ambulatory  There were no vitals filed for this visit.  BP 129/57 P 70 R 20 T 99.1 O2 98% on RA  GENERAL:alert, no distress, well nourished, well developed, comfortable, cooperative, obese, smiling and unaccompanied SKIN: skin color, texture, turgor are normal, no rashes or significant lesions HEAD: Normocephalic, No masses, lesions, tenderness or abnormalities EYES: normal, EOMI, Conjunctiva are pink and non-injected EARS: External ears normal OROPHARYNX:lips, buccal mucosa, and tongue normal and  mucous membranes are moist  NECK: supple, trachea midline LYMPH:  no palpable lymphadenopathy BREAST:not examined LUNGS: clear to auscultation  HEART: regular rate & rhythm and systolic click ABDOMEN:abdomen soft, non-tender and normal bowel sounds BACK: Back symmetric, no curvature. EXTREMITIES:less then 2 second capillary refill, no joint deformities, effusion, or inflammation, no skin discoloration, no cyanosis  NEURO: alert & oriented x 3 with fluent speech, no focal motor/sensory deficits, gait normal with cane  LABORATORY DATA: CBC    Component Value Date/Time   WBC 6.0 05/31/2016 0849   RBC 3.45 (L) 05/31/2016 0849   HGB 10.5 (L) 05/31/2016 0849   HCT 32.9 (L) 05/31/2016 0849   PLT 170 05/31/2016 0849   MCV 95.4 05/31/2016 0849  MCH 30.4 05/31/2016 0849   MCHC 31.9 05/31/2016 0849   RDW 16.1 (H) 05/31/2016 0849   LYMPHSABS 1.1 03/14/2016 1458   MONOABS 0.7 03/14/2016 1458   EOSABS 0.0 03/14/2016 1458   BASOSABS 0.0 03/14/2016 1458      Chemistry      Component Value Date/Time   NA 135 05/31/2016 0849   K 4.4 05/31/2016 0849   CL 100 (L) 05/31/2016 0849   CO2 28 05/31/2016 0849   BUN 56 (H) 05/31/2016 0849   CREATININE 1.47 (H) 05/31/2016 0849   CREATININE 2.34 (H) 02/15/2015 1253      Component Value Date/Time   CALCIUM 9.4 05/31/2016 0849   CALCIUM 9.5 06/12/2010 0459   ALKPHOS 53 03/14/2016 1458   ALKPHOS 54 03/14/2016 1458   AST 23 03/14/2016 1458   AST 23 03/14/2016 1458   ALT 16 03/14/2016 1458   ALT 16 03/14/2016 1458   BILITOT 0.7 03/14/2016 1458   BILITOT 0.6 03/14/2016 1458        PENDING LABS:   RADIOGRAPHIC STUDIES:  US Breast Ltd Uni Left Inc Axilla  Result Date: 05/16/2016 CLINICAL DATA:  82 year old patient presents for follow-up of a probable intraductal papilloma in the retroareolar left breast. She is due for annual examination of both breasts. She reports that she has had no nipple discharge over the past year, since her  mammogram of August 2016. She has no lumps or areas of concern in either breast. EXAM: 2D DIGITAL DIAGNOSTIC BILATERAL MAMMOGRAM WITH CAD AND ADJUNCT TOMO ULTRASOUND LEFT BREAST COMPARISON:  04/27/2015, 03/18/2014, 03/12/2013, 02/28/2012, 02/22/2011, 02/02/2010 ACR Breast Density Category c: The breast tissue is heterogeneously dense, which may obscure small masses. FINDINGS: Stable parenchymal pattern bilaterally. No new or suspicious mass, distortion, or suspicious microcalcification. There are multiple benign secretory type calcifications and calcified degenerating fibroadenomas bilaterally. Mammographic images were processed with CAD. Targeted ultrasound is performed, showing an echogenic intraductal mass surrounded by fluid in the immediate retroareolar left breast that measures 5 x 3 x 2 mm. This has imaging features suggestive of a papilloma, and is stable in size and appearance dating back to an ultrasound exam of May 2011. IMPRESSION: Stable 5 mm intraductal retroareolar mass in the left breast with imaging features suggestive of papilloma. Given stability in size and appearance dating back to May 2011, this can be considered benign. RECOMMENDATION: Screening mammogram in one year.(Code:SM-B-01Y) I have discussed the findings and recommendations with the patient. Results were also provided in writing at the conclusion of the visit. If applicable, a reminder letter will be sent to the patient regarding the next appointment. BI-RADS CATEGORY  2: Benign. Electronically Signed   By: Curlene Dolphin M.D.   On: 05/16/2016 14:51   Mm Diag Breast Tomo Bilateral  Result Date: 05/16/2016 CLINICAL DATA:  82 year old patient presents for follow-up of a probable intraductal papilloma in the retroareolar left breast. She is due for annual examination of both breasts. She reports that she has had no nipple discharge over the past year, since her mammogram of August 2016. She has no lumps or areas of concern in either  breast. EXAM: 2D DIGITAL DIAGNOSTIC BILATERAL MAMMOGRAM WITH CAD AND ADJUNCT TOMO ULTRASOUND LEFT BREAST COMPARISON:  04/27/2015, 03/18/2014, 03/12/2013, 02/28/2012, 02/22/2011, 02/02/2010 ACR Breast Density Category c: The breast tissue is heterogeneously dense, which may obscure small masses. FINDINGS: Stable parenchymal pattern bilaterally. No new or suspicious mass, distortion, or suspicious microcalcification. There are multiple benign secretory type calcifications and calcified degenerating fibroadenomas bilaterally.  Mammographic images were processed with CAD. Targeted ultrasound is performed, showing an echogenic intraductal mass surrounded by fluid in the immediate retroareolar left breast that measures 5 x 3 x 2 mm. This has imaging features suggestive of a papilloma, and is stable in size and appearance dating back to an ultrasound exam of May 2011. IMPRESSION: Stable 5 mm intraductal retroareolar mass in the left breast with imaging features suggestive of papilloma. Given stability in size and appearance dating back to May 2011, this can be considered benign. RECOMMENDATION: Screening mammogram in one year.(Code:SM-B-01Y) I have discussed the findings and recommendations with the patient. Results were also provided in writing at the conclusion of the visit. If applicable, a reminder letter will be sent to the patient regarding the next appointment. BI-RADS CATEGORY  2: Benign. Electronically Signed   By: Curlene Dolphin M.D.   On: 05/16/2016 14:51   Dg Knee 3 Views Right  Result Date: 05/25/2016 Clinical:  Right knee pain, no trauma X-rays were done of the right knee, three views. There is severe degenerative changes of the right knee laterally with loss of joint space and bone on bone and small osteophyte, no bone cysts.  Medial narrowing is present.  Patellofemoral joint narrowing and osteophyte present.  No fracture or loose body seen. Impression:  Severe degenerative joint disease of the right  knee, laterally with bone on bone and mild to moderate degenerative joint disease in other compartments.  No acute findings. 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  Labs today: CBC, BMET, Ferritin.  I personally reviewed and went over laboratory results with the patient.  The results are noted within this dictation.  Labs every 3 weeks: CBC  Procrit 40,000 units every 3 weeks for a HGB < 11 g/dL.  Ferritin in 90 days.  CMET in 6 months.  Return in 6 months for follow-up.    ORDERS PLACED FOR THIS ENCOUNTER: Orders Placed This Encounter  Procedures  . Basic metabolic panel  . Ferritin  . Ferritin  . Comprehensive metabolic panel  . Ferritin    MEDICATIONS PRESCRIBED THIS ENCOUNTER: No orders of the defined types were placed in this encounter.   THERAPY PLAN:  Continue with ESA therapy for chronic renal disease with follow-up with nephrologist and primary care provider as directed.  All questions were answered. The patient knows to call the clinic with any problems, questions or concerns. We can certainly see the patient much sooner if necessary.  Patient and plan discussed with Dr. Ancil Linsey and she is in agreement with the aforementioned.   This note is electronically signed by: Doy Mince 05/31/2016 9:05 PM

## 2016-05-31 NOTE — Progress Notes (Signed)
[name] [name]'s reason for visit today is for an injection and labs as scheduled per MD orders.  Labs were drawn prior to administration of ordered medication.  [name] [name] also received Procrit 40,000 units SQ per MD orders; see [facility] for administration details.  [name] [name] tolerated all procedures well and without incident; questions were answered and patient was discharged via wheelchair to see PA today. Family present

## 2016-05-31 NOTE — Patient Instructions (Signed)
[facility] at [facility] Discharge Instructions  RECOMMENDATIONS MADE BY THE CONSULTANT AND ANY TEST RESULTS WILL BE SENT TO YOUR REFERRING PHYSICIAN.  Received Procrit injection today. Follow-up as scheduled. Call clinic for any questions or concerns  Thank you for choosing [facility] at [facility] to provide your oncology and hematology care.  To afford each patient quality time with our provider, please arrive at least 15 minutes before your scheduled appointment time.   Beginning January 23rd 2017 lab work for the Cancer Center will be done in the  Main lab at [facility] on 1st floor. If you have a lab appointment with the Cancer Center please come in thru the  Main Entrance and check in at the main information desk  You need to re-schedule your appointment should you arrive 10 or more minutes late.  We strive to give you quality time with our providers, and arriving late affects you and other patients whose appointments are after yours.  Also, if you no show three or more times for appointments you may be dismissed from the clinic at the providers discretion.     Again, thank you for choosing [facility].  Our hope is that these requests will decrease the amount of time that you wait before being seen by our physicians.       _____________________________________________________________  Should you have questions after your visit to [facility], please contact our office at [phone] between the hours of 8:30 a.m. and 4:30 p.m.  Voicemails left after 4:30 p.m. will not be returned until the following business day.  For prescription refill requests, have your pharmacy contact our office.         Resources For Cancer Patients and their Caregivers ? American Cancer Society: Can assist with transportation, wigs, general needs, runs Look Good Feel Better.        1-888-227-6333 ? Cancer Care: Provides  financial assistance, online support groups, medication/co-pay assistance.  1-800-813-HOPE (4673) ? [facility] Assists [place] cancer patients and their families through emotional , educational and financial support.  336-427-4357 ? [place] DSS Where to apply for food stamps, Medicaid and utility assistance. 336-342-1394 ? RCATS: Transportation to medical appointments. 336-347-2287 ? Social Security Administration: May apply for disability if have a Stage IV cancer. 336-342-7796 1-800-772-1213 ? [place] Aging, Disability and Transit Services: Assists with nutrition, care and transit needs. 336-349-2343  Cancer Center Support Programs: @10RELATIVEDAYS@ > Cancer Support Group  2nd Tuesday of the month 1pm-2pm, Journey Room  > Creative Journey  3rd Tuesday of the month 1130am-1pm, Journey Room  > Look Good Feel Better  1st Wednesday of the month 10am-12 noon, Journey Room (Call American Cancer [employer] to register 1-800-395-5775)   

## 2016-05-31 NOTE — Telephone Encounter (Signed)
Pt aware Ferritin is good.

## 2016-05-31 NOTE — Assessment & Plan Note (Signed)
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  Labs today: CBC, BMET, Ferritin.  I personally reviewed and went over laboratory results with the patient.  The results are noted within this dictation.  Labs every 3 weeks: CBC  Procrit 40,000 units every 3 weeks for a HGB < 11 g/dL.  Ferritin in 90 days.  CMET in 6 months.  Return in 6 months for follow-up.

## 2016-05-31 NOTE — Telephone Encounter (Signed)
-----   Message from [facility], PA-C sent at 05/31/2016  3:39 PM EDT ----- Ferritin is good.

## 2016-05-31 NOTE — Patient Instructions (Signed)
[place] at [facility] Discharge Instructions  RECOMMENDATIONS MADE BY THE CONSULTANT AND ANY TEST RESULTS WILL BE SENT TO YOUR REFERRING PHYSICIAN.  You were seen today by Kirby Crigler PA-C. You had your Procrit injection today and will need to return in 3 weeks for another one.  Return in 3 weeks for labs as well.  Follow up in 6 months with a provider.   Thank you for choosing [place] at [facility] to provide your oncology and hematology care.  To afford each patient quality time with our provider, please arrive at least 15 minutes before your scheduled appointment time.   Beginning January 23rd 2017 lab work for the [facility] will be done in the  Main lab at [facility] on 1st floor. If you have a lab appointment with the [facility] please come in thru the  Main Entrance and check in at the main information desk  You need to re-schedule your appointment should you arrive 10 or more minutes late.  We strive to give you quality time with our providers, and arriving late affects you and other patients whose appointments are after yours.  Also, if you no show three or more times for appointments you may be dismissed from the clinic at the providers discretion.     Again, thank you for choosing [facility].  Our hope is that these requests will decrease the amount of time that you wait before being seen by our physicians.       _____________________________________________________________  Should you have questions after your visit to [facility], please contact our office at [phone] between the hours of 8:30 a.m. and 4:30 p.m.  Voicemails left after 4:30 p.m. will not be returned until the following business day.  For prescription refill requests, have your pharmacy contact our office.         Resources For Cancer Patients and their Caregivers ? American Cancer Society: Can assist with  transportation, wigs, general needs, runs Look Good Feel Better.        650 215 1530 ? Cancer Care: Provides financial assistance, online support groups, medication/co-pay assistance.  1-800-813-HOPE 650-780-7637) ? [facility] Assists [place] cancer patients and their families through emotional , educational and financial support.  (587) 323-0075 ? [place] DSS Where to apply for food stamps, Medicaid and utility assistance. 248-140-0168 ? RCATS: Transportation to medical appointments. 951-530-5303 ? Social Security Administration: May apply for disability if have a Stage IV cancer. 719-053-5009 407-832-5603 ? [employer], Disability and Transit Services: Assists with nutrition, care and transit needs. [place] Support Programs: @10RELATIVEDAYS @ > Cancer Support Group  2nd Tuesday of the month 1pm-2pm, Journey Room  > Creative Journey  3rd Tuesday of the month 1130am-1pm, Journey Room  > Look Good Feel Better  1st Wednesday of the month 10am-12 noon, Journey Room (Call [facility] to register [phone]) [phone])

## 2016-06-01 ENCOUNTER — Telehealth: Payer: Self-pay | Admitting: Family Medicine

## 2016-06-01 DIAGNOSIS — I10 Essential (primary) hypertension: Secondary | ICD-10-CM

## 2016-06-01 DIAGNOSIS — E1122 Type 2 diabetes mellitus with diabetic chronic kidney disease: Secondary | ICD-10-CM

## 2016-06-01 DIAGNOSIS — D631 Anemia in chronic kidney disease: Secondary | ICD-10-CM

## 2016-06-01 DIAGNOSIS — N189 Chronic kidney disease, unspecified: Secondary | ICD-10-CM

## 2016-06-01 NOTE — Telephone Encounter (Signed)
Lab order mailed.

## 2016-06-01 NOTE — Telephone Encounter (Signed)
I spoke with pt this weekend, tho she thinks she has an appt with me I see none Needs fasting lipid, cmp and  EGFR, HBa1C, TSH and vit D for October appt which also needs to be scheduled , pls arrange, thanks

## 2016-06-02 ENCOUNTER — Ambulatory Visit (HOSPITAL_COMMUNITY)
Admission: RE | Admit: 2016-06-02 | Discharge: 2016-06-02 | Disposition: A | Discharge status: Home / Self Care | Payer: Medicare Other | Source: Ambulatory Visit | Attending: Cardiovascular Disease | Admitting: Cardiovascular Disease

## 2016-06-02 DIAGNOSIS — Z954 Presence of other heart-valve replacement: Secondary | ICD-10-CM

## 2016-06-02 DIAGNOSIS — I517 Cardiomegaly: Secondary | ICD-10-CM | POA: Diagnosis not present

## 2016-06-02 DIAGNOSIS — Z952 Presence of prosthetic heart valve: Secondary | ICD-10-CM | POA: Insufficient documentation

## 2016-06-02 LAB — ECHOCARDIOGRAM COMPLETE
AO mean calculated velocity dopler: 172 cm/s
AV Area VTI index: 0.7 cm2/m2
AV Area VTI: 1.46 cm2
AV Area mean vel: 1.44 cm2
AV Mean grad: 15 mmHg
AV Peak grad: 28 mmHg
AV VEL mean LVOT/AV: 0.46
AV area mean vel ind: 0.66 cm2/m2
AV peak Index: 0.66
AV pk vel: 263 cm/s
AV vel: 1.54
Ao pk vel: 0.46 m/s
Area-P 1/2: 2.29 cm2
E decel time: 324 msec
E/e' ratio: 28.17
FS: 17 % — AB (ref 28–44)
IVS/LV PW RATIO, ED: 0.97
LA ID, A-P, ES: 44 mm
LA diam end sys: 44 mm
LA diam index: 2 cm/m2
LA vol A4C: 117 ml
LA vol index: 44 mL/m2
LA vol: 96.8 mL
LV E/e' medial: 28.17
LV E/e'average: 28.17
LV PW d: 13.5 mm — AB (ref 0.6–1.1)
LV dias vol index: 45 mL/m2
LV dias vol: 98 mL (ref 46–106)
LV e' LATERAL: 3.94 cm/s
LV sys vol index: 27 mL/m2
LV sys vol: 59 mL — AB (ref 14–42)
LVOT MV VTI INDEX: 0.7 cm2/m2
LVOT MV VTI: 1.53
LVOT SV: 70 mL
LVOT VTI: 22.4 cm
LVOT area: 3.14 cm2
LVOT diameter: 20 mm
LVOT peak VTI: 0.49 cm
LVOT peak grad rest: 6 mmHg
LVOT peak vel: 122 cm/s
Lateral S' vel: 8.33 cm/s
MV Annulus VTI: 46.1 cm
MV Dec: 324
MV M vel: 74.6
MV Peak grad: 5 mmHg
MV pk A vel: 149 m/s
MV pk E vel: 111 m/s
Mean grad: 3 mmHg
P 1/2 time: 96 ms
RV sys press: 36 mmHg
Reg peak vel: 286 cm/s
Simpson's disk: 40
Stroke v: 39 ml
TAPSE: 20.7 mm
TDI e' lateral: 3.94
TDI e' medial: 3.26
TR max vel: 286 cm/s
VTI: 45.8 cm
Valve area index: 0.7
Valve area: 1.54 cm2

## 2016-06-02 NOTE — Progress Notes (Signed)
*  PRELIMINARY RESULTS* Echocardiogram 2D Echocardiogram has been performed.  [name] [name] 06/02/2016, 2:10 PM

## 2016-06-05 ENCOUNTER — Ambulatory Visit (INDEPENDENT_AMBULATORY_CARE_PROVIDER_SITE_OTHER): Payer: Medicare Other | Admitting: *Deleted

## 2016-06-05 ENCOUNTER — Other Ambulatory Visit: Payer: Self-pay | Admitting: Urology

## 2016-06-05 DIAGNOSIS — Z5181 Encounter for therapeutic drug level monitoring: Secondary | ICD-10-CM

## 2016-06-05 DIAGNOSIS — Z7901 Long term (current) use of anticoagulants: Secondary | ICD-10-CM

## 2016-06-05 DIAGNOSIS — Z954 Presence of other heart-valve replacement: Secondary | ICD-10-CM | POA: Diagnosis not present

## 2016-06-05 DIAGNOSIS — Z952 Presence of prosthetic heart valve: Secondary | ICD-10-CM

## 2016-06-05 LAB — POCT INR: INR: 3.8

## 2016-06-06 ENCOUNTER — Telehealth: Payer: Self-pay | Admitting: Cardiovascular Disease

## 2016-06-06 ENCOUNTER — Telehealth: Payer: Self-pay | Admitting: Family Medicine

## 2016-06-06 ENCOUNTER — Ambulatory Visit (INDEPENDENT_AMBULATORY_CARE_PROVIDER_SITE_OTHER): Payer: Medicare Other | Admitting: Urology

## 2016-06-06 DIAGNOSIS — R310 Gross hematuria: Secondary | ICD-10-CM

## 2016-06-06 NOTE — Telephone Encounter (Signed)
Opened In Error

## 2016-06-06 NOTE — Telephone Encounter (Signed)
Returned call to patient-aware of echo results:  Notes Recorded by Sanda Klein, MD on 06/02/2016 at 5:12 PM EDT Little change from last study, maybe a slight strengthening of heart muscle (still moderately to severely decreased EF 30-35%), aortic valve unchanged, mitral valve gradients are indeed lower (better) with the change in meds and slower heart rate.        Pt verbalized understanding and denies further questions/concerns at this time.

## 2016-06-06 NOTE — Telephone Encounter (Signed)
New message ° ° ° ° ° °Returning a call to the nurse to get echo results °

## 2016-06-06 NOTE — Telephone Encounter (Signed)
[name] [name] is having a problem getting her Cholesterol Medication she states she hasnt had it since May, please advise?

## 2016-06-07 NOTE — Telephone Encounter (Signed)
Patient would like to hold on taking medication until she has labs before next visit.  She would like to address cholesterol medication at next visit.

## 2016-06-07 NOTE — Telephone Encounter (Signed)
Working with [employer].   Medication is not covered.

## 2016-06-07 NOTE — Telephone Encounter (Signed)
noted 

## 2016-06-08 ENCOUNTER — Other Ambulatory Visit: Payer: Self-pay | Admitting: Urology

## 2016-06-08 DIAGNOSIS — R310 Gross hematuria: Secondary | ICD-10-CM

## 2016-06-14 NOTE — Telephone Encounter (Signed)
pls see if she can be seen tomorrow to deal with the issues , has not been in since sept, she will get flu vaccine then as well

## 2016-06-14 NOTE — Telephone Encounter (Signed)
noited and pt decision stands

## 2016-06-14 NOTE — Telephone Encounter (Signed)
Noted  

## 2016-06-16 DIAGNOSIS — L851 Acquired keratosis [keratoderma] palmaris et plantaris: Secondary | ICD-10-CM | POA: Diagnosis not present

## 2016-06-16 DIAGNOSIS — L603 Nail dystrophy: Secondary | ICD-10-CM | POA: Diagnosis not present

## 2016-06-16 DIAGNOSIS — E1142 Type 2 diabetes mellitus with diabetic polyneuropathy: Secondary | ICD-10-CM | POA: Diagnosis not present

## 2016-06-16 NOTE — Telephone Encounter (Signed)
Patient will wait until next ov with Dr. [name]

## 2016-06-20 ENCOUNTER — Other Ambulatory Visit: Payer: Self-pay | Admitting: Licensed Clinical Social Worker

## 2016-06-20 ENCOUNTER — Telehealth: Payer: Self-pay | Admitting: Family Medicine

## 2016-06-20 NOTE — Telephone Encounter (Signed)
[name] [name] is calling stating that she is real congested and coughing and she is asking if a cough med could be called into [facility] in [place], please advise?

## 2016-06-20 NOTE — Patient Outreach (Signed)
Assessment:  CSW spoke via phone with client on 06/20/16. CSW verified client identity. CSW received verbal permission from client on 06/20/16 for CSW to speak with client about current client needs. Client does live alone. Client sees Dr. [name] as primary care doctor. Client said she had her prescribed medications and is taking medications as prescribed.   CSW spoke with client about client care plan.  CSW encouraged client to communicate with CSW in next 30 days to discuss community resources for transport assistance for client. Client uses a cane or walker to help her ambulate. She receives support from her cousins. She has Medicare benefit. Client said she uses oxygen as prescribed to help her sleep at night. Client and CSW had previously spoken of [employer] transport agency as a possible transport assist agency for client. Client said she has medical appointment with Dr. [name] on 07/06/16.  Client has appointment in December of 2017 with pulmonologist, as scheduled. Client said her family members help transport her to and from her local medical appointments.  Client said she is eating well.  Client said she is sleeping adequately.  Client uses inhalers as prescribed to help her with breathing. She uses nebulizer as prescribed daily. She said she likes to watch TV to relax. She talks with family and friends via phone to socialize and to relax.  Client said she takes prescribed medication for pain. She said she has periodic pain in her back that radiates down her right hip and right leg.  She said she planned to speak with Dr. [name] on July 06, 2016 medical appointment about pain issues for client.  Client said she will also see orthopedic doctor on June 22, 2016 and will also speak with that doctor about her current pain issues and symptoms. Client said she is aware of [facility] services ([facility]). CSW encouraged client to call [employer] transport service to inquire  about transport help for client through that agency.  CSW spoke with Nesiah about [facility] program support in nursing, social work and pharmacy. CSW encouraged Rosabella to call CSW at [phone] to discuss social work needs of client.  Plan:  Client to communicate with CSW in next 30 days to discuss community resources for transport assistance for client.  CSW to call client in 4 weeks to assess client needs.  [name].Samie Reasons MSW, LCSW Licensed Clinical Social Worker [facility] Care Management [phone]

## 2016-06-21 ENCOUNTER — Telehealth (HOSPITAL_COMMUNITY): Payer: Self-pay | Admitting: *Deleted

## 2016-06-21 ENCOUNTER — Encounter (HOSPITAL_COMMUNITY): Payer: Medicare Other

## 2016-06-21 ENCOUNTER — Encounter (HOSPITAL_COMMUNITY): Payer: Self-pay

## 2016-06-21 ENCOUNTER — Encounter (HOSPITAL_BASED_OUTPATIENT_CLINIC_OR_DEPARTMENT_OTHER): Payer: Medicare Other

## 2016-06-21 ENCOUNTER — Encounter: Payer: Self-pay | Admitting: Family Medicine

## 2016-06-21 ENCOUNTER — Ambulatory Visit (INDEPENDENT_AMBULATORY_CARE_PROVIDER_SITE_OTHER): Payer: Medicare Other | Admitting: *Deleted

## 2016-06-21 VITALS — BP 127/56 | HR 71 | Temp 98.4°F | Resp 18

## 2016-06-21 DIAGNOSIS — E785 Hyperlipidemia, unspecified: Secondary | ICD-10-CM | POA: Diagnosis not present

## 2016-06-21 DIAGNOSIS — E559 Vitamin D deficiency, unspecified: Secondary | ICD-10-CM | POA: Diagnosis not present

## 2016-06-21 DIAGNOSIS — Z79899 Other long term (current) drug therapy: Secondary | ICD-10-CM | POA: Diagnosis not present

## 2016-06-21 DIAGNOSIS — Z954 Presence of other heart-valve replacement: Secondary | ICD-10-CM | POA: Diagnosis not present

## 2016-06-21 DIAGNOSIS — E611 Iron deficiency: Secondary | ICD-10-CM | POA: Diagnosis not present

## 2016-06-21 DIAGNOSIS — D631 Anemia in chronic kidney disease: Secondary | ICD-10-CM | POA: Diagnosis not present

## 2016-06-21 DIAGNOSIS — Z952 Presence of prosthetic heart valve: Secondary | ICD-10-CM

## 2016-06-21 DIAGNOSIS — E1122 Type 2 diabetes mellitus with diabetic chronic kidney disease: Secondary | ICD-10-CM | POA: Diagnosis not present

## 2016-06-21 DIAGNOSIS — N183 Chronic kidney disease, stage 3 (moderate): Secondary | ICD-10-CM | POA: Diagnosis not present

## 2016-06-21 DIAGNOSIS — N189 Chronic kidney disease, unspecified: Principal | ICD-10-CM

## 2016-06-21 DIAGNOSIS — E890 Postprocedural hypothyroidism: Secondary | ICD-10-CM | POA: Diagnosis not present

## 2016-06-21 DIAGNOSIS — N184 Chronic kidney disease, stage 4 (severe): Secondary | ICD-10-CM

## 2016-06-21 DIAGNOSIS — D638 Anemia in other chronic diseases classified elsewhere: Secondary | ICD-10-CM | POA: Diagnosis not present

## 2016-06-21 DIAGNOSIS — D649 Anemia, unspecified: Secondary | ICD-10-CM

## 2016-06-21 DIAGNOSIS — Z7901 Long term (current) use of anticoagulants: Secondary | ICD-10-CM | POA: Diagnosis not present

## 2016-06-21 DIAGNOSIS — E790 Hyperuricemia without signs of inflammatory arthritis and tophaceous disease: Secondary | ICD-10-CM | POA: Diagnosis not present

## 2016-06-21 DIAGNOSIS — Z5181 Encounter for therapeutic drug level monitoring: Secondary | ICD-10-CM | POA: Diagnosis not present

## 2016-06-21 DIAGNOSIS — R809 Proteinuria, unspecified: Secondary | ICD-10-CM | POA: Diagnosis not present

## 2016-06-21 DIAGNOSIS — I10 Essential (primary) hypertension: Secondary | ICD-10-CM | POA: Diagnosis not present

## 2016-06-21 DIAGNOSIS — D509 Iron deficiency anemia, unspecified: Secondary | ICD-10-CM | POA: Diagnosis not present

## 2016-06-21 LAB — CBC
HCT: 32.2 % — ABNORMAL LOW (ref 36.0–46.0)
Hemoglobin: 10.4 g/dL — ABNORMAL LOW (ref 12.0–15.0)
MCH: 30.5 pg (ref 26.0–34.0)
MCHC: 32.3 g/dL (ref 30.0–36.0)
MCV: 94.4 fL (ref 78.0–100.0)
Platelets: 208 10*3/uL (ref 150–400)
RBC: 3.41 MIL/uL — ABNORMAL LOW (ref 3.87–5.11)
RDW: 15.8 % — ABNORMAL HIGH (ref 11.5–15.5)
WBC: 6.7 10*3/uL (ref 4.0–10.5)

## 2016-06-21 LAB — COMPREHENSIVE METABOLIC PANEL
ALT: 12 U/L — ABNORMAL LOW (ref 14–54)
AST: 23 U/L (ref 15–41)
Albumin: 3.9 g/dL (ref 3.5–5.0)
Alkaline Phosphatase: 48 U/L (ref 38–126)
Anion gap: 8 (ref 5–15)
BUN: 64 mg/dL — ABNORMAL HIGH (ref 6–20)
CO2: 28 mmol/L (ref 22–32)
Calcium: 9.2 mg/dL (ref 8.9–10.3)
Chloride: 100 mmol/L — ABNORMAL LOW (ref 101–111)
Creatinine, Ser: 1.97 mg/dL — ABNORMAL HIGH (ref 0.44–1.00)
GFR calc Af Amer: 26 mL/min — ABNORMAL LOW (ref >60)
GFR calc non Af Amer: 22 mL/min — ABNORMAL LOW (ref >60)
Glucose, Bld: 109 mg/dL — ABNORMAL HIGH (ref 65–99)
Potassium: 4.3 mmol/L (ref 3.5–5.1)
Sodium: 136 mmol/L (ref 135–145)
Total Bilirubin: 0.4 mg/dL (ref 0.3–1.2)
Total Protein: 8 g/dL (ref 6.5–8.1)

## 2016-06-21 LAB — HEMOGLOBIN A1C: Hemoglobin A1C: 5.8

## 2016-06-21 LAB — LIPID PANEL: LDL Cholesterol: 147 mg/dL

## 2016-06-21 LAB — FERRITIN: Ferritin: 955 ng/mL — ABNORMAL HIGH (ref 11–307)

## 2016-06-21 LAB — POCT INR: INR: 2.9

## 2016-06-21 MED ORDER — EPOETIN ALFA 40000 UNIT/ML IJ SOLN
40000.0000 [IU] | Freq: Once | INTRAMUSCULAR | Status: AC
  Administered 2016-06-21: 40000 [IU] via SUBCUTANEOUS

## 2016-06-21 MED ORDER — EPOETIN ALFA 40000 UNIT/ML IJ SOLN
INTRAMUSCULAR | Status: AC
  Filled 2016-06-21: qty 1

## 2016-06-21 NOTE — Telephone Encounter (Signed)
Pt aware of results 

## 2016-06-21 NOTE — Telephone Encounter (Signed)
-----   Message from Baird Cancer, PA-C sent at 06/21/2016  4:20 PM EDT ----- Stable.  Make sure she continues to follow with nephrologist.

## 2016-06-21 NOTE — Patient Instructions (Signed)
[place] at [facility] Discharge Instructions  RECOMMENDATIONS MADE BY THE CONSULTANT AND ANY TEST RESULTS WILL BE SENT TO YOUR REFERRING PHYSICIAN.  Procrit given today. Follow up as scheduled  Thank you for choosing [place] at [facility] to provide your oncology and hematology care.  To afford each patient quality time with our provider, please arrive at least 15 minutes before your scheduled appointment time.   Beginning January 23rd 2017 lab work for the [facility] will be done in the  Main lab at [facility] on 1st floor. If you have a lab appointment with the [facility] please come in thru the  Main Entrance and check in at the main information desk  You need to re-schedule your appointment should you arrive 10 or more minutes late.  We strive to give you quality time with our providers, and arriving late affects you and other patients whose appointments are after yours.  Also, if you no show three or more times for appointments you may be dismissed from the clinic at the providers discretion.     Again, thank you for choosing [facility].  Our hope is that these requests will decrease the amount of time that you wait before being seen by our physicians.       _____________________________________________________________  Should you have questions after your visit to [facility], please contact our office at [phone] between the hours of 8:30 a.m. and 4:30 p.m.  Voicemails left after 4:30 p.m. will not be returned until the following business day.  For prescription refill requests, have your pharmacy contact our office.         Resources For Cancer Patients and their Caregivers ? American Cancer Society: Can assist with transportation, wigs, general needs, runs Look Good Feel Better.        912-102-7835 ? Cancer Care: Provides financial assistance, online support groups, medication/co-pay  assistance.  1-800-813-HOPE (930)860-8971) ? [facility] Assists [place] cancer patients and their families through emotional , educational and financial support.  864 219 8048 ? [place] DSS Where to apply for food stamps, Medicaid and utility assistance. 845-396-0652 ? [facility]: Transportation to medical appointments. 207-003-2612 ? Social Security Administration: May apply for disability if have a Stage IV cancer. (601)394-6782 (786)041-7892 ? [employer], Disability and Transit Services: Assists with nutrition, care and transit needs. [facility] Support Programs: @10RELATIVEDAYS @ > Cancer Support Group  2nd Tuesday of the month 1pm-2pm, Journey Room  > Creative Journey  3rd Tuesday of the month 1130am-1pm, Journey Room  > Look Good Feel Better  1st Wednesday of the month 10am-12 noon, Journey Room (Call [place] to register [phone])

## 2016-06-21 NOTE — Progress Notes (Signed)
[name] [name] presents today for injection per MD orders. Procrit 40,000 units administered SQ in right Abdomen. Administration without incident. Patient tolerated well.  Procrit given per protocol. Vitals stable, discharged via wheelchair with her friend from clinic.

## 2016-06-21 NOTE — Telephone Encounter (Signed)
Called and left message for patient to return call.  

## 2016-06-22 ENCOUNTER — Ambulatory Visit (INDEPENDENT_AMBULATORY_CARE_PROVIDER_SITE_OTHER): Payer: Medicare Other | Admitting: Orthopaedic Surgery

## 2016-06-22 VITALS — BP 127/52 | HR 81 | Temp 97.8°F | Ht 67.0 in | Wt 218.0 lb

## 2016-06-22 DIAGNOSIS — M25561 Pain in right knee: Secondary | ICD-10-CM | POA: Diagnosis not present

## 2016-06-22 MED ORDER — HYDROCODONE-ACETAMINOPHEN 7.5-325 MG PO TABS: 1.0000 | ORAL_TABLET | Freq: Four times a day (QID) | ORAL | 0 refills | Status: DC | PRN

## 2016-06-22 NOTE — Progress Notes (Signed)
Patient [name] [name], female DOB:1931-10-07, 82 y.o. TMA:[id number]  Chief Complaint  Patient presents with  . Follow-up    right knee pain    HPI  [name] [name] is a 82 y.o. female who has chronic right knee pain.  She has no new trauma. She has swelling and popping but no giving way, no locking, no redness. HPI  Body mass index is 34.14 kg/m.  ROS  Review of Systems  HENT: Negative for congestion.   Respiratory: Negative for cough and shortness of breath.   Cardiovascular: Negative for chest pain and leg swelling.  Endocrine: Positive for cold intolerance.  Musculoskeletal: Positive for arthralgias, back pain, gait problem and joint swelling.  Allergic/Immunologic: Positive for environmental allergies.  Psychiatric/Behavioral: The patient is nervous/anxious.     Past Medical History:  Diagnosis Date  . Adenomatous polyp of colon 12/16/2002   Dr. [name]/[facility]  . Allergy   . Cataract   . CHB (complete heart block) ([place]) 10/07/2014      . CHF (congestive heart failure) ([place])       . Chronic kidney disease, stage I 2006  . Constipation       . COPD (chronic obstructive pulmonary disease) ([place])       . DJD (degenerative joint disease) of lumbar spine   . DM (diabetes mellitus) ([place]) 2006  . Esophageal reflux   . Glaucoma   . Gout       . Heart murmur   . Hyperlipemia   . IDA (iron deficiency anemia)    Parenteral iron/[name]  . Insomnia       . Non-ischemic cardiomyopathy ([place])    a. s/p MDT CRTD  . Obesity, unspecified   . Other and unspecified hyperlipidemia   . Oxygen deficiency 2014   nocturnal;  . Spondylolisthesis   . Spondylosis   . Thyroid cancer ([place])    remote thyroidectomy, no recurrence, pt denies in 2016 thyroid cancer  . Unspecified hypothyroidism       . 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 Vertigo         Past Surgical History:  Procedure Laterality Date  .  AORTIC AND MITRAL VALVE REPLACEMENT  2001  . BI-VENTRICULAR IMPLANTABLE CARDIOVERTER DEFIBRILLATOR UPGRADE N/A 01/18/2015   Procedure: BI-VENTRICULAR IMPLANTABLE CARDIOVERTER DEFIBRILLATOR UPGRADE;  Surgeon: Evans Lance, MD;  Location: [facility] CATH LAB;  Service: Cardiovascular;  Laterality: N/A;  . CARDIAC CATHETERIZATION    . CARDIAC VALVE REPLACEMENT    . COLONOSCOPY  06/12/2007   [place]k- Normal rectum/Normal colon  . COLONOSCOPY  12/16/02   small hemorrhoids  . COLONOSCOPY  06/20/2012   Procedure: COLONOSCOPY;  Surgeon: Daneil Dolin, MD;  Location: [facility] END[facility] SUITE;  Service: Endoscopy;  Laterality: N/A;  10:45  . defibrillator implanted 2006    . DOPPLER ECHOCARDIOGRAPHY  2012  . DOPPLER ECHOCARDIOGRAPHY  05,06,07,08,09,2011  . ESOPHAGOGASTRODUODENOSCOPY  06/12/2007   Rourk- normal esophagus, small hiatal hernia, otherwise normal stomach, D1, D2  . EYE SURGERY Right 2005  . EYE SURGERY Left 2006  . ICD LEAD REMOVAL Left 02/08/2015   Procedure: ICD LEAD REMOVAL;  Surgeon: Evans Lance, MD;  Location: [facility] OR;  Service: Cardiovascular;  Laterality: Left;  "Will plan extraction and insertion of a BiV PM"  **Dr. Roxan Hockey backing up case**  . IMPLANTABLE CARDIOVERTER DEFIBRILLATOR (ICD) GENERATOR CHANGE Left 02/08/2015   Procedure: ICD GENERATOR CHANGE;  Surgeon: Evans Lance, MD;  Location: [facility]  OR;  Service: Cardiovascular;  Laterality: Left;  . INSERT / REPLACE / REMOVE PACEMAKER    . PACEMAKER INSERTION  May 2016  . pacemaker placed  2004  . right breast cyst removed     benign   . right cataract removed     2005  . THYROIDECTOMY      Family History  Problem Relation Age of Onset  . Cancer Mother     behind pancres  . Heart disease Father   . Heart disease Sister     Heart attack  . Heart disease Brother   . Diabetes Brother   . Heart disease Brother   . Diabetes Brother   . Hypertension Brother   . Cancer Brother     lung    Social History Social History  Substance  Use Topics  . Smoking status: Former Smoker    Packs/day: 0.75    Years: 40.00    Types: Cigarettes    Quit date: 03/08/1987  . Smokeless tobacco: Never Used  . Alcohol use No      Current Outpatient Prescriptions  Medication Sig Dispense Refill  . acetaminophen (TYLENOL) 500 MG tablet Take 500 mg by mouth every 6 (six) hours as needed (pain).    Marland Kitchen albuterol (PROVENTIL HFA;VENTOLIN HFA) 108 (90 BASE) MCG/ACT inhaler Inhale 2 puffs into the lungs every 6 (six) hours as needed for wheezing or shortness of breath. 1 Inhaler 3  . albuterol (PROVENTIL) (2.5 MG/3ML) 0.083% nebulizer solution INHALE 1 VIAL VIA NEBULIZER THREE TIMES DAILY. 300 mL 2  . allopurinol (ZYLOPRIM) 100 MG tablet Take 1 tablet (100 mg total) by mouth daily. 90 tablet 1  . benzonatate (TESSALON) 100 MG capsule Take 1 capsule (100 mg total) by mouth 2 (two) times daily as needed for cough. 20 capsule 0  . calcitRIOL (ROCALTROL) 0.25 MCG capsule Take 1 capsule by mouth 3 (three) times a week.  0  . cholecalciferol (VITAMIN D) 1000 UNITS tablet Take 1,000 Units by mouth daily.    Marland Kitchen COUMADIN 5 MG tablet Take 2 tablets daily 180 tablet 3  . digoxin (LANOXIN) 0.125 MG tablet Take 1 tablet (0.125 mg total) by mouth every other day. 90 tablet 3  . docusate sodium (COLACE) 100 MG capsule Take 300 mg by mouth at bedtime.    Marland Kitchen ezetimibe-simvastatin (VYTORIN) 10-20 MG tablet Take 1 tablet by mouth 3  times weekly 36 tablet 1  . fluticasone (FLONASE) 50 MCG/ACT nasal spray instill 2 sprays into each nostril once daily 48 g 1  . fluticasone furoate-vilanterol (BREO ELLIPTA) 100-25 MCG/INH AEPB Inhale 1 puff into the lungs daily.    . fluticasone furoate-vilanterol (BREO ELLIPTA) 100-25 MCG/INH AEPB Inhale 1 puff into the lungs daily.    . folic acid (FOLVITE) 1 MG tablet Take 1 tablet (1 mg total) by mouth daily. 100 tablet 3  . furosemide (LASIX) 40 MG tablet Take 1 tablet by mouth  daily 90 tablet 1  . gabapentin (NEURONTIN) 300 MG  capsule Take 1 capsule (300 mg total) by mouth 2 (two) times daily. 180 capsule 1  . hydrALAZINE (APRESOLINE) 25 MG tablet Take 1 tablet (25 mg total) by mouth 3 (three) times daily. 270 tablet 3  . isosorbide dinitrate (ISOCHRON) 40 MG CR tablet Take one-half tablet by  mouth twice a day 90 tablet 6  . losartan (COZAAR) 50 MG tablet Take 1 tablet by mouth  daily 90 tablet 1  . meclizine (ANTIVERT) 12.5 MG tablet Take  1 tablet (12.5 mg total) by mouth 2 (two) times daily as needed for dizziness. 30 tablet 2  . metolazone (ZAROXOLYN) 2.5 MG tablet Take 1 tablet (2.5 mg total) by mouth once a week. 2 tablet 0  . montelukast (SINGULAIR) 10 MG tablet Take 1 tablet (10 mg total) by mouth at bedtime. 90 tablet 1  . ondansetron (ZOFRAN) 4 MG tablet Take 1 tablet (4 mg total) by mouth daily. 90 tablet 0  . Polyethyl [name] (SYSTANE OP) Place 1 drop into both eyes daily as needed (dry eyes).    . potassium chloride SA (KLOR-CON M20) 20 MEQ tablet Take 1 tablet by mouth  daily 90 tablet 1  . PROAIR HFA 108 (90 Base) MCG/ACT inhaler inhale 2 puffs by mouth every 6 hours if needed for wheezing or shortness of breath 8.5 Inhaler 3  . SYNTHROID 75 MCG tablet Take 1 tablet by mouth  daily 30 tablet 1  . temazepam (RESTORIL) 30 MG capsule Take 30 mg by mouth at bedtime as needed for sleep.    . TOPROL XL 100 MG 24 hr tablet Take 1 tablet (100 mg total) by mouth daily. Take with or immediately following a meal. 90 tablet 1  . umeclidinium bromide (INCRUSE ELLIPTA) 62.5 MCG/INH AEPB Inhale 1 puff into the lungs daily.    Marland Kitchen azelastine (ASTELIN) 0.1 % nasal spray Place 2 sprays into both nostrils 2 (two) times daily. Use in each nostril as directed (Patient not taking: Reported on 06/22/2016) 30 mL 12  . Diphenhyd-Hydrocort-Nystatin (FIRST-DUKES MOUTHWASH) SUSP 2 tsp three times daily, gargle, swish and swallow for 3 days, then as needed, up to 1 week total (Patient not taking: Reported on 06/22/2016) 237 mL  0  . Fluticasone-Salmeterol (ADVAIR DISKUS) 100-50 MCG/DOSE AEPB inhale 1 dose by mouth twice a day (Patient not taking: Reported on 06/22/2016) 180 each 3  . HYDROcodone-acetaminophen (NORCO) 7.5-325 MG tablet Take 1 tablet by mouth every 6 (six) hours as needed for moderate pain (Must last 30 days.Do not drive or operate machinery while taking this medicine.). 110 tablet 0  . promethazine-dextromethorphan (PROMETHAZINE-DM) 6.25-15 MG/5ML syrup One teaspoon at bedtime as needed, for cough (Patient not taking: Reported on 06/22/2016) 180 mL 0   No current facility-administered medications for this visit.    Facility-Administered Medications Ordered in Other Visits  Medication Dose Route Frequency Provider Last Rate Last Dose  . epoetin alfa (EPOGEN,PROCRIT) injection 40,000 Units  40,000 Units Subcutaneous Once Farrel Gobble, MD         Physical Exam  Blood pressure (!) 127/52, pulse 81, temperature 97.8 F (36.6 C), height 5\' 7"  (1.702 m), weight 218 lb (98.9 kg).  Constitutional: overall normal hygiene, normal nutrition, well developed, normal grooming, normal body habitus. Assistive device:cane  Musculoskeletal: gait and station Limp right, muscle tone and strength are normal, no tremors or atrophy is present.  .  Neurological: coordination overall normal.  Deep tendon reflex/nerve stretch intact.  Sensation normal.  Cranial nerves II-XII intact.   Skin:   Normal overall no scars, lesions, ulcers or rashes. No psoriasis.  Psychiatric: Alert and oriented x 3.  Recent memory intact, remote memory unclear.  Normal mood and affect. Well groomed.  Good eye contact.  Cardiovascular: overall no swelling, no varicosities, no edema bilaterally, normal temperatures of the legs and arms, no clubbing, cyanosis and good capillary refill.  Lymphatic: palpation is normal.  The right lower extremity is examined:  Inspection:  Thigh:  Non-tender and no defects  Knee has swelling 1+  effusion.                        Joint tenderness is present                        Patient is tender over the medial joint line  Lower Leg:  Has normal appearance and no tenderness or defects  Ankle:  Non-tender and no defects  Foot:  Non-tender and no defects Range of Motion:  Knee:  Range of motion is: 0-105                        Crepitus is  present  Ankle:  Range of motion is normal. Strength and Tone:  The right lower extremity has normal strength and tone. Stability:  Knee:  The knee is stable.  Ankle:  The ankle is stable.    The patient has been educated about the nature of the problem(s) and counseled on treatment options.  The patient appeared to understand what I have discussed and is in agreement with it.  Encounter Diagnosis  Name Primary?  . Right knee pain Yes    PLAN Call if any problems.  Precautions discussed.  Continue current medications.   Return to clinic 1 month   Electronically Signed Sanjuana Kava, MD 9/28/20171:46 PM

## 2016-06-26 ENCOUNTER — Telehealth: Payer: Self-pay | Admitting: Cardiology

## 2016-06-26 ENCOUNTER — Ambulatory Visit (HOSPITAL_COMMUNITY)
Admission: RE | Admit: 2016-06-26 | Discharge: 2016-06-26 | Disposition: A | Discharge status: Home / Self Care | Payer: Medicare Other | Source: Ambulatory Visit | Attending: Urology | Admitting: Urology

## 2016-06-26 ENCOUNTER — Ambulatory Visit (INDEPENDENT_AMBULATORY_CARE_PROVIDER_SITE_OTHER): Payer: Medicare Other | Admitting: *Deleted

## 2016-06-26 DIAGNOSIS — K8010 Calculus of gallbladder with chronic cholecystitis without obstruction: Secondary | ICD-10-CM | POA: Diagnosis not present

## 2016-06-26 DIAGNOSIS — I5022 Chronic systolic (congestive) heart failure: Secondary | ICD-10-CM

## 2016-06-26 DIAGNOSIS — I700 Atherosclerosis of aorta: Secondary | ICD-10-CM | POA: Diagnosis not present

## 2016-06-26 DIAGNOSIS — I442 Atrioventricular block, complete: Secondary | ICD-10-CM

## 2016-06-26 DIAGNOSIS — R310 Gross hematuria: Secondary | ICD-10-CM | POA: Diagnosis not present

## 2016-06-26 DIAGNOSIS — R109 Unspecified abdominal pain: Secondary | ICD-10-CM | POA: Diagnosis not present

## 2016-06-26 NOTE — Telephone Encounter (Signed)
Spoke with pt and reminded pt of remote transmission that is due today. Pt verbalized understanding.   

## 2016-06-26 NOTE — Progress Notes (Signed)
Remote ICD transmission.   

## 2016-06-28 ENCOUNTER — Encounter: Payer: Self-pay | Admitting: Cardiology

## 2016-06-28 DIAGNOSIS — R809 Proteinuria, unspecified: Secondary | ICD-10-CM | POA: Diagnosis not present

## 2016-06-28 DIAGNOSIS — N184 Chronic kidney disease, stage 4 (severe): Secondary | ICD-10-CM | POA: Diagnosis not present

## 2016-06-28 DIAGNOSIS — N2581 Secondary hyperparathyroidism of renal origin: Secondary | ICD-10-CM | POA: Diagnosis not present

## 2016-06-28 DIAGNOSIS — D638 Anemia in other chronic diseases classified elsewhere: Secondary | ICD-10-CM | POA: Diagnosis not present

## 2016-06-30 ENCOUNTER — Other Ambulatory Visit: Payer: Self-pay | Admitting: *Deleted

## 2016-06-30 LAB — CUP PACEART REMOTE DEVICE CHECK
Battery Remaining Longevity: 33 mo
Battery Voltage: 2.94 V
Brady Statistic AP VP Percent: 81.17 %
Brady Statistic AP VS Percent: 0.01 %
Brady Statistic AS VP Percent: 18.72 %
Brady Statistic AS VS Percent: 0.1 %
Brady Statistic RA Percent Paced: 81.18 %
Brady Statistic RV Percent Paced: 97.79 %
Date Time Interrogation Session: 20171002152146
HighPow Impedance: 37 Ohm
HighPow Impedance: 47 Ohm
Implantable Lead Implant Date: 20040521
Implantable Lead Implant Date: 20071218
Implantable Lead Implant Date: 20160516
Implantable Lead Location: 753858
Implantable Lead Location: 753859
Implantable Lead Location: 753860
Implantable Lead Model: 5076
Implantable Lead Model: 6947
Lead Channel Impedance Value: 304 Ohm
Lead Channel Impedance Value: 342 Ohm
Lead Channel Impedance Value: 361 Ohm
Lead Channel Impedance Value: 399 Ohm
Lead Channel Impedance Value: 399 Ohm
Lead Channel Impedance Value: 456 Ohm
Lead Channel Impedance Value: 475 Ohm
Lead Channel Impedance Value: 646 Ohm
Lead Channel Impedance Value: 703 Ohm
Lead Channel Impedance Value: 722 Ohm
Lead Channel Impedance Value: 836 Ohm
Lead Channel Impedance Value: 836 Ohm
Lead Channel Impedance Value: 836 Ohm
Lead Channel Pacing Threshold Amplitude: 0.5 V
Lead Channel Pacing Threshold Amplitude: 1.625 V
Lead Channel Pacing Threshold Amplitude: 5 V
Lead Channel Pacing Threshold Pulse Width: 0.4 ms
Lead Channel Pacing Threshold Pulse Width: 0.4 ms
Lead Channel Pacing Threshold Pulse Width: 1.5 ms
Lead Channel Sensing Intrinsic Amplitude: 1.375 mV
Lead Channel Sensing Intrinsic Amplitude: 1.375 mV
Lead Channel Sensing Intrinsic Amplitude: 10.5 mV
Lead Channel Sensing Intrinsic Amplitude: 10.5 mV
Lead Channel Setting Pacing Amplitude: 2 V
Lead Channel Setting Pacing Amplitude: 2 V
Lead Channel Setting Pacing Amplitude: 2.5 V
Lead Channel Setting Pacing Pulse Width: 0.6 ms
Lead Channel Setting Pacing Pulse Width: 1 ms
Lead Channel Setting Sensing Sensitivity: 0.3 mV

## 2016-06-30 NOTE — Patient Outreach (Signed)
06/30/16- RN CM drove to patient's home for scheduled home visit, no answer to locked door.  PLAN Attempt to reach pt next week Transfer to RN health coach/ telephonic  Jacqlyn Larsen [facility], [place] Coordinator [phone]

## 2016-07-03 ENCOUNTER — Telehealth: Payer: Self-pay | Admitting: *Deleted

## 2016-07-03 DIAGNOSIS — H40003 Preglaucoma, unspecified, bilateral: Secondary | ICD-10-CM | POA: Diagnosis not present

## 2016-07-03 DIAGNOSIS — H40013 Open angle with borderline findings, low risk, bilateral: Secondary | ICD-10-CM | POA: Diagnosis not present

## 2016-07-03 DIAGNOSIS — H02402 Unspecified ptosis of left eyelid: Secondary | ICD-10-CM | POA: Diagnosis not present

## 2016-07-03 DIAGNOSIS — H0520 Unspecified exophthalmos: Secondary | ICD-10-CM | POA: Diagnosis not present

## 2016-07-03 LAB — HM DIABETES EYE EXAM

## 2016-07-03 MED ORDER — COUMADIN 5 MG PO TABS: ORAL_TABLET | ORAL | 1 refills | Status: DC

## 2016-07-03 NOTE — Telephone Encounter (Signed)
Refill sent to [facility]

## 2016-07-03 NOTE — Telephone Encounter (Signed)
Patient states that she needs 30 day supply of Coumadin sent to [facility] in [place] until she can get her supply from [place]. /t g

## 2016-07-05 ENCOUNTER — Other Ambulatory Visit: Payer: Self-pay | Admitting: *Deleted

## 2016-07-05 ENCOUNTER — Encounter: Payer: Self-pay | Admitting: *Deleted

## 2016-07-05 ENCOUNTER — Telehealth: Payer: Self-pay | Admitting: *Deleted

## 2016-07-05 DIAGNOSIS — I509 Heart failure, unspecified: Secondary | ICD-10-CM

## 2016-07-05 MED ORDER — COUMADIN 5 MG PO TABS: ORAL_TABLET | ORAL | 1 refills | Status: DC

## 2016-07-05 NOTE — Patient Outreach (Signed)
RN CM called pt for telephone assessment, spoke with pt, HIPAA verified,  Pt states her nephew and other family members assist her with transportation and activities of daily living, pt lives with nephew, diabetes under control without medication, pt is weighing daily but not recording and does not plan to record.  Pt gets protime/ INR checked at MD office, reports having all medications except advair which she cannot afford.  Pt agreeable to pharmacy referral and transfer to RN health coach due to issues with keeping appointments for home visits.  Case transferred to RN health coach.  RN CM faxed letter to Dr. [name] reporting pt will now be managed by health coach.  THN CM Care Plan Problem One   Flowsheet Row Most Recent Value  THN Long Term Goal (31-90 days)  Patient will become familiar with HF disease process and be able to verbalize HF zones for self management over the next 45 days  THN Long Term Goal Start Date  07/05/16 [goal restarted]  Interventions for Problem One Long Term Goal  RN CM reviewed CHF zones/ action plan      PLAN Health coach to contact pt.  Jacqlyn Larsen [facility], [place] Coordinator [phone]

## 2016-07-05 NOTE — Telephone Encounter (Signed)
Rx foe coumadin was sent in 10/9 to [facility].  Resent today.

## 2016-07-05 NOTE — Telephone Encounter (Signed)
Pt is out of her coumadin, she only has 2 left to take today

## 2016-07-06 ENCOUNTER — Ambulatory Visit (INDEPENDENT_AMBULATORY_CARE_PROVIDER_SITE_OTHER): Payer: Medicare Other | Admitting: Family Medicine

## 2016-07-06 ENCOUNTER — Encounter: Payer: Self-pay | Admitting: Family Medicine

## 2016-07-06 VITALS — BP 118/70 | HR 73 | Ht 67.0 in | Wt 219.0 lb

## 2016-07-06 DIAGNOSIS — I10 Essential (primary) hypertension: Secondary | ICD-10-CM | POA: Diagnosis not present

## 2016-07-06 DIAGNOSIS — E1122 Type 2 diabetes mellitus with diabetic chronic kidney disease: Secondary | ICD-10-CM

## 2016-07-06 DIAGNOSIS — I5022 Chronic systolic (congestive) heart failure: Secondary | ICD-10-CM

## 2016-07-06 DIAGNOSIS — Z23 Encounter for immunization: Secondary | ICD-10-CM | POA: Diagnosis not present

## 2016-07-06 DIAGNOSIS — E559 Vitamin D deficiency, unspecified: Secondary | ICD-10-CM

## 2016-07-06 DIAGNOSIS — E785 Hyperlipidemia, unspecified: Secondary | ICD-10-CM

## 2016-07-06 DIAGNOSIS — Y92099 Unspecified place in other non-institutional residence as the place of occurrence of the external cause: Secondary | ICD-10-CM

## 2016-07-06 DIAGNOSIS — M25551 Pain in right hip: Secondary | ICD-10-CM

## 2016-07-06 DIAGNOSIS — M1711 Unilateral primary osteoarthritis, right knee: Secondary | ICD-10-CM

## 2016-07-06 DIAGNOSIS — W19XXXA Unspecified fall, initial encounter: Secondary | ICD-10-CM

## 2016-07-06 DIAGNOSIS — K8010 Calculus of gallbladder with chronic cholecystitis without obstruction: Secondary | ICD-10-CM

## 2016-07-06 DIAGNOSIS — Y92009 Unspecified place in unspecified non-institutional (private) residence as the place of occurrence of the external cause: Secondary | ICD-10-CM

## 2016-07-06 DIAGNOSIS — M549 Dorsalgia, unspecified: Secondary | ICD-10-CM

## 2016-07-06 MED ORDER — PRAVASTATIN SODIUM 20 MG PO TABS: 20.0000 mg | ORAL_TABLET | Freq: Every day | ORAL | 4 refills | Status: DC

## 2016-07-06 NOTE — Assessment & Plan Note (Signed)
Severe osteo right knee, impaired mobility, high fall risk with recent fall. In home PT twice weekly x 4 to 6 weeks Fall precautions discussed also

## 2016-07-06 NOTE — Patient Instructions (Signed)
F/u in 3.5 month, call if you need me sooner  Flu vaccine today  New medication for high cholesterol, pravachol sent to your pharmacy, take one at bedtime, and cut back on fried and fatty foods as able  Excellent labs otherwise   You are referred for in home pT twice weekly , [facility] [place] if possible , for next 4 to 6 weeks, due to recent fall, increased back, hip and knee pain, be careful not to fall  If you feel the need , call for referral to therapist, grief is human and normal, and my assessment today is that you are coping as best you can and it is good enough  You are referred for eye exam  Fasting labs in 3.5 months, please  Thank you  for choosing [facility]. We consider it a privelige to serve you.  Delivering excellent health care in a caring and  compassionate way is our goal.  Partnering with you,  so that together we can achieve this goal is our strategy.

## 2016-07-06 NOTE — Assessment & Plan Note (Signed)
Chronic back pain managed by ortho, pt reports using 2 pain pills daily, I have recommended she lets Prescriber know and will also send a message

## 2016-07-06 NOTE — Assessment & Plan Note (Signed)
Increased pain following recent fall, PT in home for 4 to 6 weeks

## 2016-07-06 NOTE — Assessment & Plan Note (Signed)
Stable, no decompensation noted, continue to follow up with cardiology

## 2016-07-06 NOTE — Assessment & Plan Note (Signed)
Controlled, no change in management, diet controlled

## 2016-07-06 NOTE — Assessment & Plan Note (Signed)
Controlled, no change in medication DASH diet and commitment to daily physical activity for a minimum of 30 minutes discussed and encouraged, as a part of hypertension management. The importance of attaining a healthy weight is also discussed.  BP/Weight 07/06/2016 06/22/2016 06/21/2016 05/31/2016 05/31/2016 05/26/2016 1/99/4129  Systolic BP 047 533 917 921 - 783 754  Diastolic BP 70 52 56 57 - 60 69  Wt. (Lbs) 219 218 - - 218 216 216  BMI 34.3 34.14 - - 34.14 34.34 34.34

## 2016-07-06 NOTE — Assessment & Plan Note (Signed)
Slid off chair 1 week ago, no bruising or skin breakdown, ambulating as before Refer pT and home safety discussed

## 2016-07-06 NOTE — Assessment & Plan Note (Signed)
Chronic nausea, poor surgical candidate., watchful management only

## 2016-07-06 NOTE — Progress Notes (Signed)
[name] [name]     MRN: [id number]      DOB: [birth year]   HPI [name] [name] is here for follow up and re-evaluation of chronic medical conditions, medication management and review of any available recent lab and radiology data.  Preventive health is updated, specifically  Cancer screening and Immunization.   Questions or concerns regarding consultations or procedures which the PT has had in the interim are  addressed. The PT denies any adverse reactions to current medications since the last visit.  Slipped off of her chair at home last week, and is experiencing increased hip and low back pain, alos repors stiffness in knees and instability Requests pT in home as this has helped in the past, states she does do the home exercises regularly Grieving recent loss of her brother , overall , doing as well as to be expected, tearful at times , 10 mins allowed to ventilate, has local support , family, [name] [name] and friends, no interest in therapy at this time  ROS Denies recent fever or chills. Denies sinus pressure, nasal congestion, ear pain or sore throat. Denies chest congestion, productive cough or wheezing. Denies chest pains, palpitations and leg swelling Denies abdominal pain, , vomiting,diarrhea or constipation.   Denies dysuria, frequency, hesitancy or incontinence.  Denies headaches, seizures, numbness, or tingling. Denies skin break down or rash.   PE  BP 118/70 (BP Location: Left Arm, Patient Position: Sitting, Cuff Size: Large)   Pulse 73   Ht 5\' 7"  (1.702 m)   Wt 219 lb (99.3 kg)   SpO2 96%   BMI 34.30 kg/m   Patient alert and oriented and in no cardiopulmonary distress.  HEENT: No facial asymmetry, EOMI,   oropharynx pink and moist.  Neck adequate though reduced ROM no JVD, no mass.  Chest: Clear to auscultation bilaterally.  CVS: S1, S2 murmur and click, no S3.IrRegular rate.  ABD: Soft non tender.   Ext: No edema  MS: markedly reduced  ROM spine, shoulders, hips  and knees.  Skin: Intact, no ulcerations or rash noted.  Psych: Good eye contact, normal affect. Memory intact not anxious at times teary and mildly depressed appearing.  CNS: CN 2-12 intact.  Assessment & Plan  OA (osteoarthritis) of knee Severe osteo right knee, impaired mobility, high fall risk with recent fall. In home PT twice weekly x 4 to 6 weeks Fall precautions discussed also  Back pain without radiation Chronic back pain managed by ortho, pt reports using 2 pain pills daily, I have recommended she lets Prescriber know and will also send a message  HIP PAIN, RIGHT Increased pain following recent fall, PT in home for 4 to 6 weeks  Hyperlipidemia LDL goal <70 Uncontrolled Hyperlipidemia:Low fat diet discussed and encouraged.   Lipid Panel  Lab Results  Component Value Date   CHOL 148 10/14/2015   HDL 29 (L) 10/14/2015   LDLCALC 79 10/14/2015   TRIG 201 (H) 10/14/2015   CHOLHDL 5.1 10/14/2015     Start pravachol rept in 3.5 month  Essential hypertension Controlled, no change in medication DASH diet and commitment to daily physical activity for a minimum of 30 minutes discussed and encouraged, as a part of hypertension management. The importance of attaining a healthy weight is also discussed.  BP/Weight 07/06/2016 06/22/2016 06/21/2016 05/31/2016 05/31/2016 05/26/2016 5/80/9983  Systolic BP 382 505 397 673 - 419 379  Diastolic BP 70 52 56 57 - 60 69  Wt. (Lbs) 219 218 - - 218 216 216  BMI 34.3 34.14 - - 34.14 34.34 34.34       Chronic systolic heart failure Stable, no decompensation noted, continue to follow up with cardiology  Type 2 diabetes mellitus Controlled, no change in management, diet controlled   Calculus of gallbladder Chronic nausea, poor surgical candidate., watchful management only  Fall at home, initial encounter Slid off chair 1 week ago, no bruising or skin breakdown, ambulating as before Refer pT and home safety discussed

## 2016-07-06 NOTE — Assessment & Plan Note (Signed)
Uncontrolled Hyperlipidemia:Low fat diet discussed and encouraged.   Lipid Panel  Lab Results  Component Value Date   CHOL 148 10/14/2015   HDL 29 (L) 10/14/2015   LDLCALC 79 10/14/2015   TRIG 201 (H) 10/14/2015   CHOLHDL 5.1 10/14/2015     Start pravachol rept in 3.5 month

## 2016-07-12 ENCOUNTER — Encounter (HOSPITAL_COMMUNITY): Payer: Self-pay

## 2016-07-12 ENCOUNTER — Encounter (HOSPITAL_COMMUNITY): Payer: Medicare Other

## 2016-07-12 ENCOUNTER — Ambulatory Visit (INDEPENDENT_AMBULATORY_CARE_PROVIDER_SITE_OTHER): Payer: Medicare Other | Admitting: *Deleted

## 2016-07-12 ENCOUNTER — Encounter (HOSPITAL_COMMUNITY): Payer: Medicare Other | Attending: Hematology & Oncology

## 2016-07-12 VITALS — BP 122/57 | HR 70 | Temp 98.0°F | Resp 18

## 2016-07-12 DIAGNOSIS — N184 Chronic kidney disease, stage 4 (severe): Secondary | ICD-10-CM | POA: Insufficient documentation

## 2016-07-12 DIAGNOSIS — N189 Chronic kidney disease, unspecified: Principal | ICD-10-CM

## 2016-07-12 DIAGNOSIS — Z952 Presence of prosthetic heart valve: Secondary | ICD-10-CM | POA: Diagnosis not present

## 2016-07-12 DIAGNOSIS — D631 Anemia in chronic kidney disease: Secondary | ICD-10-CM

## 2016-07-12 DIAGNOSIS — Z5181 Encounter for therapeutic drug level monitoring: Secondary | ICD-10-CM

## 2016-07-12 DIAGNOSIS — Z7901 Long term (current) use of anticoagulants: Secondary | ICD-10-CM | POA: Diagnosis not present

## 2016-07-12 DIAGNOSIS — D649 Anemia, unspecified: Secondary | ICD-10-CM

## 2016-07-12 LAB — HEMOGLOBIN: Hemoglobin: 10.9 g/dL — ABNORMAL LOW (ref 12.0–15.0)

## 2016-07-12 LAB — FERRITIN: Ferritin: 1008 ng/mL — ABNORMAL HIGH (ref 11–307)

## 2016-07-12 LAB — POCT INR: INR: 3.6

## 2016-07-12 MED ORDER — EPOETIN ALFA 40000 UNIT/ML IJ SOLN
40000.0000 [IU] | Freq: Once | INTRAMUSCULAR | Status: AC
  Administered 2016-07-12: 40000 [IU] via SUBCUTANEOUS
  Filled 2016-07-12: qty 1

## 2016-07-12 NOTE — Patient Instructions (Signed)
[place] at [facility] Discharge Instructions  RECOMMENDATIONS MADE BY THE CONSULTANT AND ANY TEST RESULTS WILL BE SENT TO YOUR REFERRING PHYSICIAN.  Procrit injection today (hemoglobin was 10.9). Return as scheduled for lab work and injections. Return as scheduled for office visit.   Thank you for choosing [place] at [facility] to provide your oncology and hematology care.  To afford each patient quality time with our provider, please arrive at least 15 minutes before your scheduled appointment time.   Beginning January 23rd 2017 lab work for the [facility] will be done in the  Main lab at [facility] on 1st floor. If you have a lab appointment with the [facility] [place] please come in thru the  Main Entrance and check in at the main information desk  You need to re-schedule your appointment should you arrive 10 or more minutes late.  We strive to give you quality time with our providers, and arriving late affects you and other patients whose appointments are after yours.  Also, if you no show three or more times for appointments you may be dismissed from the clinic at the providers discretion.     Again, thank you for choosing [facility].  Our hope is that these requests will decrease the amount of time that you wait before being seen by our physicians.       _____________________________________________________________  Should you have questions after your visit to [facility], please contact our office at [phone] between the hours of 8:30 a.m. and 4:30 p.m.  Voicemails left after 4:30 p.m. will not be returned until the following business day.  For prescription refill requests, have your pharmacy contact our office.         Resources For Cancer Patients and their Caregivers ? American Cancer Society: Can assist with transportation, wigs, general needs, runs Look Good Feel Better.         6183503145 ? Cancer Care: Provides financial assistance, online support groups, medication/co-pay assistance.  1-800-813-HOPE 306-640-3172) ? [place] [facility]ists [place] cancer patients and their families through emotional , educational and financial support.  408-253-1948 ? [place] DSS Where to apply for food stamps, Medicaid and utility assistance. 858-102-2965 ? RCATS: Transportation to medical appointments. 857-482-7255 ? Social Security Administration: May apply for disability if have a Stage IV cancer. 437 726 8648 [phone] ? [employer], Disability and Transit Services: Assists with nutrition, care and transit needs. [place] Support Programs: @10RELATIVEDAYS @ > Cancer Support Group  2nd Tuesday of the month 1pm-2pm, Journey Room  > Creative Journey  3rd Tuesday of the month 1130am-1pm, Journey Room  > Look Good Feel Better  1st Wednesday of the month 10am-12 noon, Journey Room (Call [place] to register [phone])

## 2016-07-12 NOTE — Progress Notes (Signed)
[name] [name] presents today for injection per the provider's orders.  Procrit administration without incident; see MAR for injection details.  Patient tolerated procedure well and without incident.  No questions or complaints noted at this time.

## 2016-07-13 ENCOUNTER — Other Ambulatory Visit: Payer: Self-pay

## 2016-07-13 NOTE — Patient Outreach (Signed)
[facility]) Care Management  07/13/2016  [name] [name] 07/01/1932 [id number]   Telephone call to patient for introductory call. Patient reports she is doing ok today. Discussed with patient health coach role. Patient receptive to call.    Plan: RN Health Coach will contact patient in the month of November and patient agrees to next outreach.  Jone Baseman, RN, MSN [place] [phone]

## 2016-07-17 DIAGNOSIS — E669 Obesity, unspecified: Secondary | ICD-10-CM | POA: Diagnosis not present

## 2016-07-17 DIAGNOSIS — M25551 Pain in right hip: Secondary | ICD-10-CM | POA: Diagnosis not present

## 2016-07-17 DIAGNOSIS — Z6834 Body mass index (BMI) 34.0-34.9, adult: Secondary | ICD-10-CM | POA: Diagnosis not present

## 2016-07-17 DIAGNOSIS — F329 Major depressive disorder, single episode, unspecified: Secondary | ICD-10-CM | POA: Diagnosis not present

## 2016-07-17 DIAGNOSIS — E785 Hyperlipidemia, unspecified: Secondary | ICD-10-CM | POA: Diagnosis not present

## 2016-07-17 DIAGNOSIS — M545 Low back pain: Secondary | ICD-10-CM | POA: Diagnosis not present

## 2016-07-17 DIAGNOSIS — N189 Chronic kidney disease, unspecified: Secondary | ICD-10-CM | POA: Diagnosis not present

## 2016-07-17 DIAGNOSIS — Z950 Presence of cardiac pacemaker: Secondary | ICD-10-CM | POA: Diagnosis not present

## 2016-07-17 DIAGNOSIS — W19XXXD Unspecified fall, subsequent encounter: Secondary | ICD-10-CM | POA: Diagnosis not present

## 2016-07-17 DIAGNOSIS — E1122 Type 2 diabetes mellitus with diabetic chronic kidney disease: Secondary | ICD-10-CM | POA: Diagnosis not present

## 2016-07-17 DIAGNOSIS — Z9981 Dependence on supplemental oxygen: Secondary | ICD-10-CM | POA: Diagnosis not present

## 2016-07-17 DIAGNOSIS — M1711 Unilateral primary osteoarthritis, right knee: Secondary | ICD-10-CM | POA: Diagnosis not present

## 2016-07-17 DIAGNOSIS — I5022 Chronic systolic (congestive) heart failure: Secondary | ICD-10-CM | POA: Diagnosis not present

## 2016-07-17 DIAGNOSIS — I110 Hypertensive heart disease with heart failure: Secondary | ICD-10-CM | POA: Diagnosis not present

## 2016-07-18 ENCOUNTER — Encounter: Payer: Self-pay | Admitting: Family Medicine

## 2016-07-18 DIAGNOSIS — N390 Urinary tract infection, site not specified: Secondary | ICD-10-CM | POA: Diagnosis not present

## 2016-07-19 ENCOUNTER — Telehealth: Payer: Self-pay

## 2016-07-19 ENCOUNTER — Other Ambulatory Visit: Payer: Self-pay | Admitting: Family Medicine

## 2016-07-19 DIAGNOSIS — I110 Hypertensive heart disease with heart failure: Secondary | ICD-10-CM | POA: Diagnosis not present

## 2016-07-19 DIAGNOSIS — E1122 Type 2 diabetes mellitus with diabetic chronic kidney disease: Secondary | ICD-10-CM | POA: Diagnosis not present

## 2016-07-19 DIAGNOSIS — M25551 Pain in right hip: Secondary | ICD-10-CM | POA: Diagnosis not present

## 2016-07-19 DIAGNOSIS — I5022 Chronic systolic (congestive) heart failure: Secondary | ICD-10-CM | POA: Diagnosis not present

## 2016-07-19 DIAGNOSIS — M545 Low back pain: Secondary | ICD-10-CM | POA: Diagnosis not present

## 2016-07-19 DIAGNOSIS — M1711 Unilateral primary osteoarthritis, right knee: Secondary | ICD-10-CM | POA: Diagnosis not present

## 2016-07-19 MED ORDER — CIPROFLOXACIN HCL 500 MG PO TABS: 500.0000 mg | ORAL_TABLET | Freq: Two times a day (BID) | ORAL | 0 refills | Status: DC

## 2016-07-19 NOTE — Telephone Encounter (Signed)
Medication sent to pharmacy.  Patient aware that if she did not hear back from office today to check with pharmacy.

## 2016-07-19 NOTE — Telephone Encounter (Signed)
Verbal standing order given for urinalysis with reflex culture.   [facility] will send over results when available.

## 2016-07-19 NOTE — Telephone Encounter (Signed)
threendays of cipro printed for faxing to her local pharmacy

## 2016-07-20 ENCOUNTER — Ambulatory Visit: Payer: Medicare Other | Admitting: Orthopaedic Surgery

## 2016-07-20 ENCOUNTER — Other Ambulatory Visit: Payer: Self-pay | Admitting: Licensed Clinical Social Worker

## 2016-07-20 NOTE — Patient Outreach (Signed)
Assessment:  CSW spoke via phone with client. CSW verified client identity. CSW received verbal permission from client on 07/20/16 for CSW to speak with client about current client needs. Client sees Dr. [name] as primary care doctor. Client had medical appointment with Dr. Dorothea Ogle on 07/06/16. Client is now staying with her nephew for support. Nephew helps with client transport needs and helps with daily activity needs of client. She said she has some assistance from her cousins periodically as well. . She has transport challenges related to attending out of town medical appointments. She has Medicare. Client and CSW spoke of client care plan. CSW encouraged client to communicate with CSW in next 30 days to discuss community resources for transport assistance for client. CSW had previously talked with client about [employer] transport agency. CSW had encouraged client to call [employer] agency to discuss transport costs for transport assist for client to go to and from her medical appointments in [place], [place]. Client uses a cane or walker to help her ambulate. She has prescribed medications and is taking medications as prescribed. Client said she fatigues easily. She uses oxygen as prescribed at night to help her with her breathing. Client said she takes prescribed medication for pain. She said that sometimes her family members help transport her to and from local medical appointments. Client is at risk for falls. Client said she has started today taking an antibiotic for infection.  Client is receiving some physical therapy sessions in the home 2 times weekly.  She said she is eating well and sleeping adequately.  She said she thinks home health physical therapy sessions will be helpful to her. CSW spoke with client about [facility] support in social work, nursing and pharmacy programs. CSW encouraged client to call CSW at [phone] as needed to discuss social work needs of client.  Plan:  Client to  communicate with CSW in next 30 days to discuss community resources for transport assistance for client.    CSW to call client in 4 weeks to assess client needs at that time.  [name].Keaundre Thelin MSW, LCSW Licensed Clinical Social Worker [facility] Care Management [phone]

## 2016-07-21 DIAGNOSIS — M545 Low back pain: Secondary | ICD-10-CM | POA: Diagnosis not present

## 2016-07-21 DIAGNOSIS — E1122 Type 2 diabetes mellitus with diabetic chronic kidney disease: Secondary | ICD-10-CM | POA: Diagnosis not present

## 2016-07-21 DIAGNOSIS — I110 Hypertensive heart disease with heart failure: Secondary | ICD-10-CM | POA: Diagnosis not present

## 2016-07-21 DIAGNOSIS — I5022 Chronic systolic (congestive) heart failure: Secondary | ICD-10-CM | POA: Diagnosis not present

## 2016-07-21 DIAGNOSIS — M25551 Pain in right hip: Secondary | ICD-10-CM | POA: Diagnosis not present

## 2016-07-21 DIAGNOSIS — M1711 Unilateral primary osteoarthritis, right knee: Secondary | ICD-10-CM | POA: Diagnosis not present

## 2016-07-24 ENCOUNTER — Other Ambulatory Visit: Payer: Self-pay | Admitting: Pharmacist

## 2016-07-24 ENCOUNTER — Ambulatory Visit: Payer: Medicare Other | Admitting: Family Medicine

## 2016-07-24 DIAGNOSIS — I110 Hypertensive heart disease with heart failure: Secondary | ICD-10-CM | POA: Diagnosis not present

## 2016-07-24 DIAGNOSIS — E1122 Type 2 diabetes mellitus with diabetic chronic kidney disease: Secondary | ICD-10-CM | POA: Diagnosis not present

## 2016-07-24 DIAGNOSIS — M25551 Pain in right hip: Secondary | ICD-10-CM | POA: Diagnosis not present

## 2016-07-24 DIAGNOSIS — I5022 Chronic systolic (congestive) heart failure: Secondary | ICD-10-CM | POA: Diagnosis not present

## 2016-07-24 DIAGNOSIS — M545 Low back pain: Secondary | ICD-10-CM | POA: Diagnosis not present

## 2016-07-24 DIAGNOSIS — M1711 Unilateral primary osteoarthritis, right knee: Secondary | ICD-10-CM | POA: Diagnosis not present

## 2016-07-24 NOTE — Patient Outreach (Addendum)
[name] [name] is a 82 y.o. female referred to pharmacy for medication assistance related to the cost of her Advair inhaler. Called and spoke with patient. HIPAA identifiers verified and verbal consent received.  [name] [name] reports that she sees Dr. Luan Pulling for her lungs. Reports that she entered the donut hole of her insurance a couple of months ago. Reports that she was unable to afford her Advair so Dr. Luan Pulling gave her samples of Breo. Reports that she takes the [facility] usually once daily as directed. However, reports that she occasionally misses a dose. Counsel on the importance of medication adherence. Confirm that patient has her rescue inhaler. Reports that she has a couple of sample inhalers left. Denies difficulty with affording the rest of her medications.  Reports that she has never applied for extra help. Discuss the application with the patient and offer to help her to complete it. Reports that she does not feel comfortable giving all of the required information out over the phone. Let her know that she can pick up a papercopy from the local social security office. [name] [name] reports that she needs to go to the social security office anyway. [name] [name] reports that she needs to get off of the phone now because her therapist is coming over. Asks that I call her back to finish our discussion about patient assistance options.   PLAN  Will call [name] [name] back on Wednesday, 07/26/16 at [place], PharmD Clinical Pharmacist [facility] [phone]

## 2016-07-25 ENCOUNTER — Telehealth: Payer: Self-pay

## 2016-07-25 DIAGNOSIS — M25551 Pain in right hip: Secondary | ICD-10-CM | POA: Diagnosis not present

## 2016-07-25 DIAGNOSIS — M545 Low back pain: Secondary | ICD-10-CM | POA: Diagnosis not present

## 2016-07-25 DIAGNOSIS — I5022 Chronic systolic (congestive) heart failure: Secondary | ICD-10-CM | POA: Diagnosis not present

## 2016-07-25 DIAGNOSIS — I110 Hypertensive heart disease with heart failure: Secondary | ICD-10-CM | POA: Diagnosis not present

## 2016-07-25 DIAGNOSIS — E1122 Type 2 diabetes mellitus with diabetic chronic kidney disease: Secondary | ICD-10-CM | POA: Diagnosis not present

## 2016-07-25 DIAGNOSIS — M1711 Unilateral primary osteoarthritis, right knee: Secondary | ICD-10-CM | POA: Diagnosis not present

## 2016-07-25 NOTE — Telephone Encounter (Signed)
Needs positive urine culture to prevent antibiotic failure when she does needs it. May submit urine c/s. If has bothersome urine symptoms if nefg then urology eval warranted

## 2016-07-26 ENCOUNTER — Other Ambulatory Visit: Payer: Self-pay | Admitting: Pharmacist

## 2016-07-26 DIAGNOSIS — E1122 Type 2 diabetes mellitus with diabetic chronic kidney disease: Secondary | ICD-10-CM | POA: Diagnosis not present

## 2016-07-26 DIAGNOSIS — M25551 Pain in right hip: Secondary | ICD-10-CM | POA: Diagnosis not present

## 2016-07-26 DIAGNOSIS — M545 Low back pain: Secondary | ICD-10-CM | POA: Diagnosis not present

## 2016-07-26 DIAGNOSIS — I110 Hypertensive heart disease with heart failure: Secondary | ICD-10-CM | POA: Diagnosis not present

## 2016-07-26 DIAGNOSIS — I5022 Chronic systolic (congestive) heart failure: Secondary | ICD-10-CM | POA: Diagnosis not present

## 2016-07-26 DIAGNOSIS — M1711 Unilateral primary osteoarthritis, right knee: Secondary | ICD-10-CM | POA: Diagnosis not present

## 2016-07-26 NOTE — Patient Outreach (Signed)
Follow up telephone appointment with Ms. Lightsey to discuss patient assistance for her Advair. Called and spoke with patient. HIPAA identifiers verified and verbal consent received.  Ms. Radcliffe reports that she has not yet had a chance to go to social security to pick up a copy of the extra help application, but states that she will be doing so. Remind patient that I am available to help her if she has any questions while completing this application.   Discuss with Ms. Keeling the patient assistance available through [place] for Advair. Based on Ms. Seki' reported income and out-of-pocket prescription expense for this year, the patient would be eligible for this program. However, Ms. Diefendorf reports that given that it is toward the end of the calendar year and that she is able to obtain further samples from her pulmonologist to the end of the year, she would like to hold off on completing this application until the new year. Counsel patient on the importance of medication adherence.   Patient reports that she has no further medication questions/concerns for me at this time. Provide patient with my phone number. Will close pharmacy episode at this time.  Harlow Asa, PharmD Clinical Pharmacist [place] [facility] [phone]

## 2016-07-31 NOTE — Telephone Encounter (Signed)
Patient aware.

## 2016-08-01 DIAGNOSIS — E1122 Type 2 diabetes mellitus with diabetic chronic kidney disease: Secondary | ICD-10-CM | POA: Diagnosis not present

## 2016-08-01 DIAGNOSIS — M1711 Unilateral primary osteoarthritis, right knee: Secondary | ICD-10-CM | POA: Diagnosis not present

## 2016-08-01 DIAGNOSIS — M545 Low back pain: Secondary | ICD-10-CM | POA: Diagnosis not present

## 2016-08-01 DIAGNOSIS — I5022 Chronic systolic (congestive) heart failure: Secondary | ICD-10-CM | POA: Diagnosis not present

## 2016-08-01 DIAGNOSIS — I110 Hypertensive heart disease with heart failure: Secondary | ICD-10-CM | POA: Diagnosis not present

## 2016-08-01 DIAGNOSIS — M25551 Pain in right hip: Secondary | ICD-10-CM | POA: Diagnosis not present

## 2016-08-02 ENCOUNTER — Encounter (HOSPITAL_COMMUNITY): Payer: Medicare Other | Attending: Hematology & Oncology

## 2016-08-02 ENCOUNTER — Ambulatory Visit (INDEPENDENT_AMBULATORY_CARE_PROVIDER_SITE_OTHER): Payer: Medicare Other | Admitting: *Deleted

## 2016-08-02 VITALS — BP 119/58 | HR 69 | Temp 98.3°F | Resp 18

## 2016-08-02 DIAGNOSIS — N184 Chronic kidney disease, stage 4 (severe): Secondary | ICD-10-CM

## 2016-08-02 DIAGNOSIS — D631 Anemia in chronic kidney disease: Secondary | ICD-10-CM

## 2016-08-02 DIAGNOSIS — D649 Anemia, unspecified: Secondary | ICD-10-CM | POA: Diagnosis not present

## 2016-08-02 DIAGNOSIS — C184 Malignant neoplasm of transverse colon: Secondary | ICD-10-CM | POA: Insufficient documentation

## 2016-08-02 DIAGNOSIS — Z952 Presence of prosthetic heart valve: Secondary | ICD-10-CM | POA: Diagnosis not present

## 2016-08-02 DIAGNOSIS — Z5181 Encounter for therapeutic drug level monitoring: Secondary | ICD-10-CM | POA: Diagnosis not present

## 2016-08-02 DIAGNOSIS — Z7901 Long term (current) use of anticoagulants: Secondary | ICD-10-CM | POA: Diagnosis not present

## 2016-08-02 DIAGNOSIS — N189 Chronic kidney disease, unspecified: Secondary | ICD-10-CM

## 2016-08-02 LAB — CBC
HCT: 33.9 % — ABNORMAL LOW (ref 36.0–46.0)
Hemoglobin: 10.8 g/dL — ABNORMAL LOW (ref 12.0–15.0)
MCH: 30.3 pg (ref 26.0–34.0)
MCHC: 31.9 g/dL (ref 30.0–36.0)
MCV: 95 fL (ref 78.0–100.0)
Platelets: 198 10*3/uL (ref 150–400)
RBC: 3.57 MIL/uL — ABNORMAL LOW (ref 3.87–5.11)
RDW: 16 % — ABNORMAL HIGH (ref 11.5–15.5)
WBC: 6 10*3/uL (ref 4.0–10.5)

## 2016-08-02 LAB — POCT INR: INR: 4

## 2016-08-02 MED ORDER — EPOETIN ALFA 40000 UNIT/ML IJ SOLN
40000.0000 [IU] | Freq: Once | INTRAMUSCULAR | Status: AC
  Administered 2016-08-02: 40000 [IU] via SUBCUTANEOUS

## 2016-08-02 MED ORDER — EPOETIN ALFA 40000 UNIT/ML IJ SOLN
INTRAMUSCULAR | Status: AC
  Filled 2016-08-02: qty 1

## 2016-08-02 NOTE — Patient Instructions (Signed)
[place] at [facility] Discharge Instructions  RECOMMENDATIONS MADE BY THE CONSULTANT AND ANY TEST RESULTS WILL BE SENT TO YOUR REFERRING PHYSICIAN.  Hemoglobin 10.8 today. Procrit 40,000 units given as ordered. Return as scheduled.  Thank you for choosing [place] at [facility] to provide your oncology and hematology care.  To afford each patient quality time with our provider, please arrive at least 15 minutes before your scheduled appointment time.   Beginning January 23rd 2017 lab work for the [facility] will be done in the  Main lab at [facility] on 1st floor. If you have a lab appointment with the [place] please come in thru the  Main Entrance and check in at the main information desk  You need to re-schedule your appointment should you arrive 10 or more minutes late.  We strive to give you quality time with our providers, and arriving late affects you and other patients whose appointments are after yours.  Also, if you no show three or more times for appointments you may be dismissed from the clinic at the providers discretion.     Again, thank you for choosing [facility].  Our hope is that these requests will decrease the amount of time that you wait before being seen by our physicians.       _____________________________________________________________  Should you have questions after your visit to [facility], please contact our office at [phone] between the hours of 8:30 a.m. and 4:30 p.m.  Voicemails left after 4:30 p.m. will not be returned until the following business day.  For prescription refill requests, have your pharmacy contact our office.         Resources For Cancer Patients and their Caregivers ? American Cancer Society: Can assist with transportation, wigs, general needs, runs Look Good Feel Better.        254-022-6102 ? Cancer Care: Provides financial assistance,  online support groups, medication/co-pay assistance.  1-800-813-HOPE (419) 091-3899) ? [place] Assists [place] cancer patients and their families through emotional , educational and financial support.  (863)362-4323 ? [place] DSS Where to apply for food stamps, Medicaid and utility assistance. (906)054-7464 ? RCATS: Transportation to medical appointments. 607-753-8260 ? Social Security Administration: May apply for disability if have a Stage IV cancer. 479-886-3890 727-003-3495 ? [employer], Disability and Transit Services: Assists with nutrition, care and transit needs. [place] Support Programs: @10RELATIVEDAYS @ > Cancer Support Group  2nd Tuesday of the month 1pm-2pm, Journey Room  > Creative Journey  3rd Tuesday of the month 1130am-1pm, Journey Room  > Look Good Feel Better  1st Wednesday of the month 10am-12 noon, Journey Room (Call [place] to register [phone])

## 2016-08-02 NOTE — Progress Notes (Signed)
[name] [name] presents today for injection per MD orders. Procrit 40,000 units administered SQ in left Abdomen. Administration without incident. Patient tolerated well.

## 2016-08-03 ENCOUNTER — Telehealth: Payer: Self-pay | Admitting: Cardiovascular Disease

## 2016-08-03 DIAGNOSIS — M1711 Unilateral primary osteoarthritis, right knee: Secondary | ICD-10-CM | POA: Diagnosis not present

## 2016-08-03 DIAGNOSIS — M545 Low back pain: Secondary | ICD-10-CM | POA: Diagnosis not present

## 2016-08-03 DIAGNOSIS — I5022 Chronic systolic (congestive) heart failure: Secondary | ICD-10-CM | POA: Diagnosis not present

## 2016-08-03 DIAGNOSIS — E1122 Type 2 diabetes mellitus with diabetic chronic kidney disease: Secondary | ICD-10-CM | POA: Diagnosis not present

## 2016-08-03 DIAGNOSIS — M25551 Pain in right hip: Secondary | ICD-10-CM | POA: Diagnosis not present

## 2016-08-03 DIAGNOSIS — I110 Hypertensive heart disease with heart failure: Secondary | ICD-10-CM | POA: Diagnosis not present

## 2016-08-03 NOTE — Telephone Encounter (Signed)
New message  Pharmacy says isosorbide dintrate 40mg  1/2 pill 2xdaily, is no longer made/90 day supply  Can a substitute be prescribed  [facility]

## 2016-08-07 DIAGNOSIS — I5022 Chronic systolic (congestive) heart failure: Secondary | ICD-10-CM | POA: Diagnosis not present

## 2016-08-07 DIAGNOSIS — I110 Hypertensive heart disease with heart failure: Secondary | ICD-10-CM | POA: Diagnosis not present

## 2016-08-07 DIAGNOSIS — M545 Low back pain: Secondary | ICD-10-CM | POA: Diagnosis not present

## 2016-08-07 DIAGNOSIS — M25551 Pain in right hip: Secondary | ICD-10-CM | POA: Diagnosis not present

## 2016-08-07 DIAGNOSIS — E1122 Type 2 diabetes mellitus with diabetic chronic kidney disease: Secondary | ICD-10-CM | POA: Diagnosis not present

## 2016-08-07 DIAGNOSIS — M1711 Unilateral primary osteoarthritis, right knee: Secondary | ICD-10-CM | POA: Diagnosis not present

## 2016-08-08 ENCOUNTER — Other Ambulatory Visit: Payer: Self-pay

## 2016-08-08 NOTE — Patient Outreach (Signed)
[place] [facility]) Care Management  [place]  08/08/2016   [name] [name] 08/03/1932 [id number]  Subjective: Telephone call to patient for initial assessment.  Patient reports she is doing well. She reports that therapy continues to see her from advanced home care. She states she saw her primary doctor last month and visit went well. Asked patient about her weights she states she does not weigh daily but several times a week.  Reviewed with patient signs of heart failure and heart failure zones and when to notify physician.  She verbalized understanding.   Objective:   Encounter Medications:  Outpatient Encounter Prescriptions as of 08/08/2016  Medication Sig Note  . acetaminophen (TYLENOL) 500 MG tablet Take 500 mg by mouth every 6 (six) hours as needed (pain).   Marland Kitchen albuterol (PROVENTIL HFA;VENTOLIN HFA) 108 (90 BASE) MCG/ACT inhaler Inhale 2 puffs into the lungs every 6 (six) hours as needed for wheezing or shortness of breath.   Marland Kitchen albuterol (PROVENTIL) (2.5 MG/3ML) 0.083% nebulizer solution INHALE 1 VIAL VIA NEBULIZER THREE TIMES DAILY.   Marland Kitchen allopurinol (ZYLOPRIM) 100 MG tablet Take 1 tablet (100 mg total) by mouth daily.   Marland Kitchen azelastine (ASTELIN) 0.1 % nasal spray Place 2 sprays into both nostrils 2 (two) times daily. Use in each nostril as directed   . calcitRIOL (ROCALTROL) 0.25 MCG capsule Take 1 capsule by mouth 3 (three) times a week. 03/31/2016: Received from: External Pharmacy Received Sig: TAKE 1 TAB ON [place], AND FRIDAYS.  Marland Kitchen cholecalciferol (VITAMIN D) 1000 UNITS tablet Take 1,000 Units by mouth daily.   Marland Kitchen COUMADIN 5 MG tablet Take 2 tablets daily   . digoxin (LANOXIN) 0.125 MG tablet Take 1 tablet (0.125 mg total) by mouth every other day.   . Diphenhyd-Hydrocort-Nystatin (FIRST-DUKES MOUTHWASH) SUSP 2 tsp three times daily, gargle, swish and swallow for 3 days, then as needed, up to 1 week total   . docusate sodium (COLACE) 100 MG capsule Take 300  mg by mouth at bedtime.   Marland Kitchen ezetimibe-simvastatin (VYTORIN) 10-20 MG tablet Take 1 tablet by mouth 3  times weekly   . fluticasone (FLONASE) 50 MCG/ACT nasal spray instill 2 sprays into each nostril once daily   . fluticasone furoate-vilanterol (BREO ELLIPTA) 100-25 MCG/INH AEPB Inhale 1 puff into the lungs daily.   . folic acid (FOLVITE) 1 MG tablet Take 1 tablet (1 mg total) by mouth daily.   . furosemide (LASIX) 40 MG tablet Take 1 tablet by mouth  daily   . gabapentin (NEURONTIN) 300 MG capsule Take 1 capsule (300 mg total) by mouth 2 (two) times daily.   . hydrALAZINE (APRESOLINE) 25 MG tablet Take 1 tablet (25 mg total) by mouth 3 (three) times daily.   Marland Kitchen HYDROcodone-acetaminophen (NORCO) 7.5-325 MG tablet Take 1 tablet by mouth every 6 (six) hours as needed for moderate pain (Must last 30 days.Do not drive or operate machinery while taking this medicine.).   Marland Kitchen isosorbide dinitrate (ISOCHRON) 40 MG CR tablet Take one-half tablet by  mouth twice a day   . losartan (COZAAR) 50 MG tablet Take 1 tablet by mouth  daily   . meclizine (ANTIVERT) 12.5 MG tablet Take 1 tablet (12.5 mg total) by mouth 2 (two) times daily as needed for dizziness.   . metolazone (ZAROXOLYN) 2.5 MG tablet Take 1 tablet (2.5 mg total) by mouth once a week.   . ondansetron (ZOFRAN) 4 MG tablet Take 1 tablet (4 mg total) by mouth daily.   Marland Kitchen  Polyethyl [name] (SYSTANE OP) Place 1 drop into both eyes daily as needed (dry eyes).   . potassium chloride SA (KLOR-CON M20) 20 MEQ tablet Take 1 tablet by mouth  daily   . pravastatin (PRAVACHOL) 20 MG tablet Take 1 tablet (20 mg total) by mouth daily.   Marland Kitchen PROAIR HFA 108 (90 Base) MCG/ACT inhaler inhale 2 puffs by mouth every 6 hours if needed for wheezing or shortness of breath   . SYNTHROID 75 MCG tablet Take 1 tablet by mouth  daily   . temazepam (RESTORIL) 30 MG capsule Take 30 mg by mouth at bedtime as needed for sleep.   . TOPROL XL 100 MG 24 hr tablet Take 1  tablet (100 mg total) by mouth daily. Take with or immediately following a meal.   . umeclidinium bromide (INCRUSE ELLIPTA) 62.5 MCG/INH AEPB Inhale 1 puff into the lungs daily.   . benzonatate (TESSALON) 100 MG capsule Take 1 capsule (100 mg total) by mouth 2 (two) times daily as needed for cough. (Patient not taking: Reported on 08/08/2016)   . ciprofloxacin (CIPRO) 500 MG tablet Take 1 tablet (500 mg total) by mouth 2 (two) times daily. (Patient not taking: Reported on 08/08/2016)   . montelukast (SINGULAIR) 10 MG tablet Take 1 tablet (10 mg total) by mouth at bedtime. (Patient not taking: Reported on 08/08/2016)   . promethazine-dextromethorphan (PROMETHAZINE-DM) 6.25-15 MG/5ML syrup One teaspoon at bedtime as needed, for cough (Patient not taking: Reported on 08/08/2016)    Facility-Administered Encounter Medications as of 08/08/2016  Medication  . epoetin alfa (EPOGEN,PROCRIT) injection 40,000 Units    Functional Status:  In your present state of health, do you have any difficulty performing the following activities: 08/08/2016 04/24/2016  Hearing? N N  Vision? N N  Difficulty concentrating or making decisions? N Y  Walking or climbing stairs? N Y  Dressing or bathing? N Y  Doing errands, shopping? [place]  Preparing Food and eating ? N N  Using the Toilet? N N  In the past six months, have you accidently leaked urine? N N  Do you have problems with loss of bowel control? N N  Managing your Medications? N N  Managing your Finances? N N  Housekeeping or managing your Housekeeping? N N  Some recent data might be hidden    Fall/Depression Screening: PHQ 2/9 Scores 08/08/2016 07/05/2016 04/24/2016 04/24/2016 04/17/2016 10/21/2015 07/09/2014  PHQ - 2 Score 0 0 2 2 1  0 0  PHQ- 9 Score - - 9 6 - - -    Assessment: Patient will benefit from health coach outreach for reinforcement of heart failure for disease management.  Plan:  [facility] CM Care Plan Problem One   Flowsheet Row Most Recent  Value  Care Plan Problem One  Heart Failure knowledge deficit  Role Documenting the Problem One  Health Coach  Care Plan for Problem One  Active  [facility]N Long Term Goal (31-90 days)  Patient will become familiar with heart failure disease process and be able to verbalize heart failure zones for self management wtihin 90 days.    [facility]N Long Term Goal Start Date  08/08/16  Interventions for Problem One [place] reviewed heart failure zones and when to notify physician.       RN Health Coach will provide ongoing education for patient on heart failure through phone calls and sending printed information to patient for further discussion.  RN Health Coach will sent welcome  letter. RN Health Coach will send initial barriers letter, assessment, and care plan to primary care physician. RN Health Coach will contact patient in the month of December and patient agrees to next contact.  Jone Baseman, RN, MSN [place] [phone]

## 2016-08-09 DIAGNOSIS — M1711 Unilateral primary osteoarthritis, right knee: Secondary | ICD-10-CM | POA: Diagnosis not present

## 2016-08-09 DIAGNOSIS — I110 Hypertensive heart disease with heart failure: Secondary | ICD-10-CM | POA: Diagnosis not present

## 2016-08-09 DIAGNOSIS — M25551 Pain in right hip: Secondary | ICD-10-CM | POA: Diagnosis not present

## 2016-08-09 DIAGNOSIS — M545 Low back pain: Secondary | ICD-10-CM | POA: Diagnosis not present

## 2016-08-09 DIAGNOSIS — E1122 Type 2 diabetes mellitus with diabetic chronic kidney disease: Secondary | ICD-10-CM | POA: Diagnosis not present

## 2016-08-09 DIAGNOSIS — I5022 Chronic systolic (congestive) heart failure: Secondary | ICD-10-CM | POA: Diagnosis not present

## 2016-08-10 DIAGNOSIS — M545 Low back pain: Secondary | ICD-10-CM | POA: Diagnosis not present

## 2016-08-10 DIAGNOSIS — M1711 Unilateral primary osteoarthritis, right knee: Secondary | ICD-10-CM | POA: Diagnosis not present

## 2016-08-10 DIAGNOSIS — I110 Hypertensive heart disease with heart failure: Secondary | ICD-10-CM | POA: Diagnosis not present

## 2016-08-10 DIAGNOSIS — E1122 Type 2 diabetes mellitus with diabetic chronic kidney disease: Secondary | ICD-10-CM | POA: Diagnosis not present

## 2016-08-10 DIAGNOSIS — I5022 Chronic systolic (congestive) heart failure: Secondary | ICD-10-CM | POA: Diagnosis not present

## 2016-08-10 DIAGNOSIS — M25551 Pain in right hip: Secondary | ICD-10-CM | POA: Diagnosis not present

## 2016-08-14 DIAGNOSIS — M25551 Pain in right hip: Secondary | ICD-10-CM | POA: Diagnosis not present

## 2016-08-14 DIAGNOSIS — M1711 Unilateral primary osteoarthritis, right knee: Secondary | ICD-10-CM | POA: Diagnosis not present

## 2016-08-14 DIAGNOSIS — I5022 Chronic systolic (congestive) heart failure: Secondary | ICD-10-CM | POA: Diagnosis not present

## 2016-08-14 DIAGNOSIS — I110 Hypertensive heart disease with heart failure: Secondary | ICD-10-CM | POA: Diagnosis not present

## 2016-08-14 DIAGNOSIS — E1122 Type 2 diabetes mellitus with diabetic chronic kidney disease: Secondary | ICD-10-CM | POA: Diagnosis not present

## 2016-08-14 DIAGNOSIS — M545 Low back pain: Secondary | ICD-10-CM | POA: Diagnosis not present

## 2016-08-16 ENCOUNTER — Telehealth: Payer: Self-pay | Admitting: Cardiovascular Disease

## 2016-08-16 ENCOUNTER — Other Ambulatory Visit: Payer: Self-pay | Admitting: Family Medicine

## 2016-08-16 MED ORDER — ISOSORBIDE DINITRATE ER 40 MG PO TBCR: EXTENDED_RELEASE_TABLET | ORAL | 2 refills | Status: DC

## 2016-08-16 NOTE — Telephone Encounter (Signed)
New message       *STAT* If patient is at the pharmacy, call can be transferred to refill team.   1. Which medications need to be refilled? (please list name of each medication and dose if known)  Isosorbide 40mg   2. Which pharmacy/location (including street and city if local pharmacy) is medication to be sent to?  [facility] [place] 3. Do they need a 30 day or 90 day supply?  90 day supply-----pt has 2 pills left

## 2016-08-16 NOTE — Telephone Encounter (Signed)
Rx has been sent to the pharmacy electronically. ° °

## 2016-08-18 DIAGNOSIS — M545 Low back pain: Secondary | ICD-10-CM | POA: Diagnosis not present

## 2016-08-18 DIAGNOSIS — M1711 Unilateral primary osteoarthritis, right knee: Secondary | ICD-10-CM | POA: Diagnosis not present

## 2016-08-18 DIAGNOSIS — I110 Hypertensive heart disease with heart failure: Secondary | ICD-10-CM | POA: Diagnosis not present

## 2016-08-18 DIAGNOSIS — M25551 Pain in right hip: Secondary | ICD-10-CM | POA: Diagnosis not present

## 2016-08-18 DIAGNOSIS — E1122 Type 2 diabetes mellitus with diabetic chronic kidney disease: Secondary | ICD-10-CM | POA: Diagnosis not present

## 2016-08-18 DIAGNOSIS — I5022 Chronic systolic (congestive) heart failure: Secondary | ICD-10-CM | POA: Diagnosis not present

## 2016-08-20 NOTE — Telephone Encounter (Signed)
Can change to isosorbide mononitrate 60 mg daily

## 2016-08-21 ENCOUNTER — Ambulatory Visit (INDEPENDENT_AMBULATORY_CARE_PROVIDER_SITE_OTHER): Payer: Medicare Other | Admitting: *Deleted

## 2016-08-21 ENCOUNTER — Other Ambulatory Visit: Payer: Self-pay | Admitting: *Deleted

## 2016-08-21 DIAGNOSIS — I110 Hypertensive heart disease with heart failure: Secondary | ICD-10-CM | POA: Diagnosis not present

## 2016-08-21 DIAGNOSIS — E1122 Type 2 diabetes mellitus with diabetic chronic kidney disease: Secondary | ICD-10-CM | POA: Diagnosis not present

## 2016-08-21 DIAGNOSIS — Z7901 Long term (current) use of anticoagulants: Secondary | ICD-10-CM

## 2016-08-21 DIAGNOSIS — Z952 Presence of prosthetic heart valve: Secondary | ICD-10-CM | POA: Diagnosis not present

## 2016-08-21 DIAGNOSIS — Z5181 Encounter for therapeutic drug level monitoring: Secondary | ICD-10-CM | POA: Diagnosis not present

## 2016-08-21 DIAGNOSIS — M1711 Unilateral primary osteoarthritis, right knee: Secondary | ICD-10-CM | POA: Diagnosis not present

## 2016-08-21 DIAGNOSIS — M545 Low back pain: Secondary | ICD-10-CM | POA: Diagnosis not present

## 2016-08-21 DIAGNOSIS — I5022 Chronic systolic (congestive) heart failure: Secondary | ICD-10-CM | POA: Diagnosis not present

## 2016-08-21 DIAGNOSIS — M25551 Pain in right hip: Secondary | ICD-10-CM | POA: Diagnosis not present

## 2016-08-21 LAB — POCT INR: INR: 3.1

## 2016-08-21 MED ORDER — ISOSORBIDE MONONITRATE ER 60 MG PO TB24: 60.0000 mg | ORAL_TABLET | Freq: Every day | ORAL | 0 refills | Status: DC

## 2016-08-22 ENCOUNTER — Other Ambulatory Visit (HOSPITAL_COMMUNITY): Payer: Self-pay | Admitting: *Deleted

## 2016-08-22 DIAGNOSIS — E611 Iron deficiency: Secondary | ICD-10-CM

## 2016-08-23 ENCOUNTER — Encounter (HOSPITAL_COMMUNITY): Payer: Medicare Other

## 2016-08-23 ENCOUNTER — Other Ambulatory Visit: Payer: Self-pay | Admitting: Licensed Clinical Social Worker

## 2016-08-23 DIAGNOSIS — D649 Anemia, unspecified: Secondary | ICD-10-CM | POA: Diagnosis not present

## 2016-08-23 DIAGNOSIS — C184 Malignant neoplasm of transverse colon: Secondary | ICD-10-CM | POA: Diagnosis not present

## 2016-08-23 DIAGNOSIS — N184 Chronic kidney disease, stage 4 (severe): Secondary | ICD-10-CM

## 2016-08-23 LAB — HEMOGLOBIN: Hemoglobin: 11.3 g/dL — ABNORMAL LOW (ref 12.0–15.0)

## 2016-08-23 NOTE — Progress Notes (Signed)
Hemoglobin 11.3. Aranesp not given, treatment parameters not met. Return as scheduled.

## 2016-08-23 NOTE — Patient Outreach (Signed)
Assessment:  CSW spoke via phone with client. CSW verified client identity. CSW received verbal permission from client on 08/23/16 for CSW to speak with client about current client needs. Client does live alone. She said she has her prescribed medications and is taking medications as prescribed.  Client sees Dr. [name] as primary care doctor. Client uses a cane or walker to help her with ambulation. She said she has some family support in transporting her to and from her scheduled medical appointments. Client has Medicare but not Medicaid. CSW has talked with client about [employer] transport agency and encouraged client to call that agency to discuss transport needs of client and customary transport costs related to client upcoming appointments.  Client said she and her niece would call [employer] agency to discuss transport needs of client and customary costs to  transport client to and from client's scheuduled appointments in [place], [place].  [place] and client spoke of client care plan. CSW encouraged client to communicate with CSW in next 30 days to discuss community resources for transport assistance for client.  Client said she uses oxygen as prescribed to help her sleep at night. Client said she uses inhalers as prescribed to help her with breathing. She uses nebulizer as prescribed for breathing treatments.Client receives support from [place], Jon [place], RN.  CSW spoke with client about [facility] program support. CSW encouraged client to call CSW at [phone] as needed to discuss social work needs of client. Client said she is eating well and sleeping adequately.  Client was appreciative of phone call from [place] on 08/23/16.    Plan:  Client to communicate with CSW in next 30 days to discuss community resources for transport assistance for client.  CSW to call client in 4 weeks to assess client needs.  [name] [name].[name] [name] MSW, LCSW Licensed Clinical Social Worker [name] Care  Management [phone]

## 2016-08-24 DIAGNOSIS — M1711 Unilateral primary osteoarthritis, right knee: Secondary | ICD-10-CM | POA: Diagnosis not present

## 2016-08-24 DIAGNOSIS — I5022 Chronic systolic (congestive) heart failure: Secondary | ICD-10-CM | POA: Diagnosis not present

## 2016-08-24 DIAGNOSIS — E1122 Type 2 diabetes mellitus with diabetic chronic kidney disease: Secondary | ICD-10-CM | POA: Diagnosis not present

## 2016-08-24 DIAGNOSIS — I110 Hypertensive heart disease with heart failure: Secondary | ICD-10-CM | POA: Diagnosis not present

## 2016-08-24 DIAGNOSIS — M545 Low back pain: Secondary | ICD-10-CM | POA: Diagnosis not present

## 2016-08-24 DIAGNOSIS — M25551 Pain in right hip: Secondary | ICD-10-CM | POA: Diagnosis not present

## 2016-08-25 DIAGNOSIS — M25551 Pain in right hip: Secondary | ICD-10-CM | POA: Diagnosis not present

## 2016-08-25 DIAGNOSIS — I110 Hypertensive heart disease with heart failure: Secondary | ICD-10-CM | POA: Diagnosis not present

## 2016-08-25 DIAGNOSIS — I5022 Chronic systolic (congestive) heart failure: Secondary | ICD-10-CM | POA: Diagnosis not present

## 2016-08-25 DIAGNOSIS — M1711 Unilateral primary osteoarthritis, right knee: Secondary | ICD-10-CM | POA: Diagnosis not present

## 2016-08-25 DIAGNOSIS — E1122 Type 2 diabetes mellitus with diabetic chronic kidney disease: Secondary | ICD-10-CM | POA: Diagnosis not present

## 2016-08-25 DIAGNOSIS — M545 Low back pain: Secondary | ICD-10-CM | POA: Diagnosis not present

## 2016-08-28 DIAGNOSIS — M25551 Pain in right hip: Secondary | ICD-10-CM | POA: Diagnosis not present

## 2016-08-28 DIAGNOSIS — M545 Low back pain: Secondary | ICD-10-CM | POA: Diagnosis not present

## 2016-08-28 DIAGNOSIS — M1711 Unilateral primary osteoarthritis, right knee: Secondary | ICD-10-CM | POA: Diagnosis not present

## 2016-08-28 DIAGNOSIS — E1122 Type 2 diabetes mellitus with diabetic chronic kidney disease: Secondary | ICD-10-CM | POA: Diagnosis not present

## 2016-08-28 DIAGNOSIS — I5022 Chronic systolic (congestive) heart failure: Secondary | ICD-10-CM | POA: Diagnosis not present

## 2016-08-28 DIAGNOSIS — I110 Hypertensive heart disease with heart failure: Secondary | ICD-10-CM | POA: Diagnosis not present

## 2016-08-31 DIAGNOSIS — M25551 Pain in right hip: Secondary | ICD-10-CM | POA: Diagnosis not present

## 2016-08-31 DIAGNOSIS — I110 Hypertensive heart disease with heart failure: Secondary | ICD-10-CM | POA: Diagnosis not present

## 2016-08-31 DIAGNOSIS — M1711 Unilateral primary osteoarthritis, right knee: Secondary | ICD-10-CM | POA: Diagnosis not present

## 2016-08-31 DIAGNOSIS — E1122 Type 2 diabetes mellitus with diabetic chronic kidney disease: Secondary | ICD-10-CM | POA: Diagnosis not present

## 2016-08-31 DIAGNOSIS — M545 Low back pain: Secondary | ICD-10-CM | POA: Diagnosis not present

## 2016-08-31 DIAGNOSIS — I5022 Chronic systolic (congestive) heart failure: Secondary | ICD-10-CM | POA: Diagnosis not present

## 2016-09-01 DIAGNOSIS — L603 Nail dystrophy: Secondary | ICD-10-CM | POA: Diagnosis not present

## 2016-09-01 DIAGNOSIS — E1142 Type 2 diabetes mellitus with diabetic polyneuropathy: Secondary | ICD-10-CM | POA: Diagnosis not present

## 2016-09-01 DIAGNOSIS — L851 Acquired keratosis [keratoderma] palmaris et plantaris: Secondary | ICD-10-CM | POA: Diagnosis not present

## 2016-09-04 ENCOUNTER — Other Ambulatory Visit: Payer: Self-pay

## 2016-09-04 DIAGNOSIS — J449 Chronic obstructive pulmonary disease, unspecified: Secondary | ICD-10-CM | POA: Diagnosis not present

## 2016-09-04 DIAGNOSIS — I10 Essential (primary) hypertension: Secondary | ICD-10-CM | POA: Diagnosis not present

## 2016-09-04 DIAGNOSIS — N184 Chronic kidney disease, stage 4 (severe): Secondary | ICD-10-CM | POA: Diagnosis not present

## 2016-09-04 MED ORDER — SYNTHROID 75 MCG PO TABS: ORAL_TABLET | ORAL | 1 refills | Status: DC

## 2016-09-04 MED ORDER — GABAPENTIN 300 MG PO CAPS: 300.0000 mg | ORAL_CAPSULE | Freq: Two times a day (BID) | ORAL | 1 refills | Status: DC

## 2016-09-04 NOTE — Patient Outreach (Signed)
[place] [facility]) Care Management  09/04/2016  [name] [name] 01-18-1932 [id number]   Telephone call to patient for monthly call.  No answer.  HIPAA compliant voice message left.  Plan: RN Health Coach will attempt patient again in the month of December.   Jone Baseman, RN, MSN [place] [phone]

## 2016-09-05 ENCOUNTER — Encounter: Payer: Self-pay | Admitting: Orthopaedic Surgery

## 2016-09-05 ENCOUNTER — Ambulatory Visit (INDEPENDENT_AMBULATORY_CARE_PROVIDER_SITE_OTHER): Payer: Medicare Other | Admitting: Orthopaedic Surgery

## 2016-09-05 VITALS — BP 150/76 | HR 70 | Temp 97.7°F | Ht 67.0 in | Wt 222.0 lb

## 2016-09-05 DIAGNOSIS — M7061 Trochanteric bursitis, right hip: Secondary | ICD-10-CM | POA: Diagnosis not present

## 2016-09-05 MED ORDER — HYDROCODONE-ACETAMINOPHEN 7.5-325 MG PO TABS: 1.0000 | ORAL_TABLET | Freq: Four times a day (QID) | ORAL | 0 refills | Status: DC | PRN

## 2016-09-05 NOTE — Progress Notes (Signed)
Patient [name] [name], female DOB:28-Mar-1932, 82 y.o. OMB:[id number]  Chief Complaint  Patient presents with  . Hip Pain    right    HPI  [name] [name] is a 82 y.o. female who has developed pain in the right hip laterally.  She has no trauma. She has no redness.  She has pain that radiates almost to the right knee.  She has tried ice and heat with little help. HPI  Body mass index is 34.77 kg/m.  ROS  Review of Systems  HENT: Negative for congestion.   Respiratory: Negative for cough and shortness of breath.   Cardiovascular: Negative for chest pain and leg swelling.  Endocrine: Positive for cold intolerance.  Musculoskeletal: Positive for arthralgias, back pain, gait problem and joint swelling.  Allergic/Immunologic: Positive for environmental allergies.  Psychiatric/Behavioral: The patient is nervous/anxious.     Past Medical History:  Diagnosis Date  . Adenomatous polyp of colon 12/16/2002   Dr. [name]/[facility]  . Allergy   . Cataract   . CHB (complete heart block) ([place]) 10/07/2014      . CHF (congestive heart failure) ([place])       . Chronic kidney disease, stage I 2006  . Constipation       . COPD (chronic obstructive pulmonary disease) ([place])       . DJD (degenerative joint disease) of lumbar spine   . DM (diabetes mellitus) ([place]) 2006  . Esophageal reflux   . Glaucoma   . Gout       . Heart murmur   . Hyperlipemia   . IDA (iron deficiency anemia)    Parenteral iron/[name]  . Insomnia       . Non-ischemic cardiomyopathy ([place])    a. s/p MDT CRTD  . Obesity, unspecified   . Other and unspecified hyperlipidemia   . Oxygen deficiency 2014   nocturnal;  . Spondylolisthesis   . Spondylosis   . Thyroid cancer ([place])    remote thyroidectomy, no recurrence, pt denies in 2016 thyroid cancer  . Unspecified hypothyroidism       . 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 Vertigo          Past Surgical History:  Procedure Laterality Date  . AORTIC AND MITRAL VALVE REPLACEMENT  2001  . BI-VENTRICULAR IMPLANTABLE CARDIOVERTER DEFIBRILLATOR UPGRADE N/A 01/18/2015   Procedure: BI-VENTRICULAR IMPLANTABLE CARDIOVERTER DEFIBRILLATOR UPGRADE;  Surgeon: Evans Lance, MD;  Location: [facility] CATH LAB;  Service: Cardiovascular;  Laterality: N/A;  . CARDIAC CATHETERIZATION    . CARDIAC VALVE REPLACEMENT    . COLONOSCOPY  06/12/2007   Rourk- Normal rectum/Normal colon  . COLONOSCOPY  12/16/02   small hemorrhoids  . COLONOSCOPY  06/20/2012   Procedure: COLONOSCOPY;  Surgeon: Daneil Dolin, MD;  Location[facility] ENDO [facility];  Service: Endoscopy;  Laterality: N/A;  10:45  . defibrillator implanted 2006    . DOPPLER ECHOCARDIOGRAPHY  2012  . DOPPLER ECHOCARDIOGRAPHY  05,06,07,08,09,2011  . ESOPHAGOGASTRODUODENOSCOPY  06/12/2007   Rourk- normal esophagus, small hiatal hernia, otherwise normal stomach, D1, D2  . EYE SURGERY Right 2005  . EYE SURGERY Left 2006  . ICD LEAD REMOVAL Left 02/08/2015   Procedure: ICD LEAD REMOVAL;  Surgeon: Evans Lance, MD;  Location: [facility] OR;  Service: Cardiovascular;  Laterality: Left;  "Will plan extraction and insertion of a BiV PM"  **Dr. Roxan Hockey backing up case**  . IMPLANTABLE CARDIOVERTER DEFIBRILLATOR (ICD) GENERATOR CHANGE Left 02/08/2015  Procedure: ICD GENERATOR CHANGE;  Surgeon: Evans Lance, MD;  Location: [place];  Service: Cardiovascular;  Laterality: Left;  . INSERT / REPLACE / REMOVE PACEMAKER    . PACEMAKER INSERTION  May 2016  . pacemaker placed  2004  . right breast cyst removed     benign   . right cataract removed     2005  . THYROIDECTOMY      Family History  Problem Relation Age of Onset  . Cancer Mother     behind pancres  . Heart disease Father   . Heart disease Sister     Heart attack  . Heart disease Brother   . Diabetes Brother   . Heart disease Brother   . Diabetes Brother   . Hypertension Brother   . Cancer  Brother     lung    Social History Social History  Substance Use Topics  . Smoking status: Former Smoker    Packs/day: 0.75    Years: 40.00    Types: Cigarettes    Quit date: 03/08/1987  . Smokeless tobacco: Never Used  . Alcohol use No      Current Outpatient Prescriptions  Medication Sig Dispense Refill  . acetaminophen (TYLENOL) 500 MG tablet Take 500 mg by mouth every 6 (six) hours as needed (pain).    Marland Kitchen albuterol (PROVENTIL HFA;VENTOLIN HFA) 108 (90 BASE) MCG/ACT inhaler Inhale 2 puffs into the lungs every 6 (six) hours as needed for wheezing or shortness of breath. 1 Inhaler 3  . albuterol (PROVENTIL) (2.5 MG/3ML) 0.083% nebulizer solution INHALE 1 VIAL VIA NEBULIZER THREE TIMES DAILY. 300 mL 2  . allopurinol (ZYLOPRIM) 100 MG tablet Take 1 tablet (100 mg total) by mouth daily. 90 tablet 1  . azelastine (ASTELIN) 0.1 % nasal spray Place 2 sprays into both nostrils 2 (two) times daily. Use in each nostril as directed 30 mL 12  . benzonatate (TESSALON) 100 MG capsule Take 1 capsule (100 mg total) by mouth 2 (two) times daily as needed for cough. (Patient not taking: Reported on 08/08/2016) 20 capsule 0  . calcitRIOL (ROCALTROL) 0.25 MCG capsule Take 1 capsule by mouth 3 (three) times a week.  0  . cholecalciferol (VITAMIN D) 1000 UNITS tablet Take 1,000 Units by mouth daily.    . ciprofloxacin (CIPRO) 500 MG tablet Take 1 tablet (500 mg total) by mouth 2 (two) times daily. (Patient not taking: Reported on 08/08/2016) 6 tablet 0  . COUMADIN 5 MG tablet Take 2 tablets daily 60 tablet 1  . digoxin (LANOXIN) 0.125 MG tablet Take 1 tablet (0.125 mg total) by mouth every other day. 90 tablet 3  . Diphenhyd-Hydrocort-Nystatin (FIRST-DUKES MOUTHWASH) SUSP 2 tsp three times daily, gargle, swish and swallow for 3 days, then as needed, up to 1 week total 237 mL 0  . docusate sodium (COLACE) 100 MG capsule Take 300 mg by mouth at bedtime.    Marland Kitchen ezetimibe-simvastatin (VYTORIN) 10-20 MG tablet  Take 1 tablet by mouth 3  times weekly 36 tablet 1  . fluticasone (FLONASE) 50 MCG/ACT nasal spray instill 2 sprays into each nostril once daily 48 g 1  . fluticasone furoate-vilanterol (BREO ELLIPTA) 100-25 MCG/INH AEPB Inhale 1 puff into the lungs daily.    . folic acid (FOLVITE) 1 MG tablet Take 1 tablet (1 mg total) by mouth daily. 100 tablet 3  . furosemide (LASIX) 40 MG tablet Take 1 tablet by mouth  daily 90 tablet 1  . gabapentin (NEURONTIN) 300  MG capsule Take 1 capsule (300 mg total) by mouth 2 (two) times daily. 180 capsule 1  . hydrALAZINE (APRESOLINE) 25 MG tablet Take 1 tablet (25 mg total) by mouth 3 (three) times daily. 270 tablet 3  . HYDROcodone-acetaminophen (NORCO) 7.5-325 MG tablet Take 1 tablet by mouth every 6 (six) hours as needed for moderate pain (Must last 30 days.Do not drive or operate machinery while taking this medicine.). 110 tablet 0  . isosorbide mononitrate (IMDUR) 60 MG 24 hr tablet Take 1 tablet (60 mg total) by mouth daily. 90 tablet 0  . losartan (COZAAR) 50 MG tablet Take 1 tablet by mouth  daily 90 tablet 1  . meclizine (ANTIVERT) 12.5 MG tablet take 1 tablet by mouth twice a day if needed for dizziness 30 tablet 2  . metolazone (ZAROXOLYN) 2.5 MG tablet Take 1 tablet (2.5 mg total) by mouth once a week. 2 tablet 0  . montelukast (SINGULAIR) 10 MG tablet Take 1 tablet (10 mg total) by mouth at bedtime. (Patient not taking: Reported on 08/08/2016) 90 tablet 1  . ondansetron (ZOFRAN) 4 MG tablet Take 1 tablet (4 mg total) by mouth daily. 90 tablet 0  . Polyethyl [name] (SYSTANE OP) Place 1 drop into both eyes daily as needed (dry eyes).    . potassium chloride SA (KLOR-CON M20) 20 MEQ tablet Take 1 tablet by mouth  daily 90 tablet 1  . pravastatin (PRAVACHOL) 20 MG tablet Take 1 tablet (20 mg total) by mouth daily. 30 tablet 4  . PROAIR HFA 108 (90 Base) MCG/ACT inhaler inhale 2 puffs by mouth every 6 hours if needed for wheezing or shortness of  breath 8.5 Inhaler 3  . promethazine-dextromethorphan (PROMETHAZINE-DM) 6.25-15 MG/5ML syrup One teaspoon at bedtime as needed, for cough (Patient not taking: Reported on 08/08/2016) 180 mL 0  . SYNTHROID 75 MCG tablet Take 1 tablet by mouth  daily 90 tablet 1  . temazepam (RESTORIL) 30 MG capsule Take 30 mg by mouth at bedtime as needed for sleep.    . TOPROL XL 100 MG 24 hr tablet Take 1 tablet (100 mg total) by mouth daily. Take with or immediately following a meal. 90 tablet 1  . umeclidinium bromide (INCRUSE ELLIPTA) 62.5 MCG/INH AEPB Inhale 1 puff into the lungs daily.     No current facility-administered medications for this visit.    Facility-Administered Medications Ordered in Other Visits  Medication Dose Route Frequency Provider Last Rate Last Dose  . epoetin alfa (EPOGEN,PROCRIT) injection 40,000 Units  40,000 Units Subcutaneous Once Farrel Gobble, MD         Physical Exam  Blood pressure (!) 150/76, pulse 70, temperature 97.7 F (36.5 C), height 5\' 7"  (1.702 m), weight 222 lb (100.7 kg).  Constitutional: overall normal hygiene, normal nutrition, well developed, normal grooming, normal body habitus. Assistive device:none  Musculoskeletal: gait and station Limp right, muscle tone and strength are normal, no tremors or atrophy is present.  .  Neurological: coordination overall normal.  Deep tendon reflex/nerve stretch intact.  Sensation normal.  Cranial nerves II-XII intact.   Skin:   Normal overall no scars, lesions, ulcers or rashes. No psoriasis.  Psychiatric: Alert and oriented x 3.  Recent memory intact, remote memory unclear.  Normal mood and affect. Well groomed.  Good eye contact.  Cardiovascular: overall no swelling, no varicosities, no edema bilaterally, normal temperatures of the legs and arms, no clubbing, cyanosis and good capillary refill.  Lymphatic: palpation is normal.  The  right hip has tenderness of the greater trochanteric area. She has no redness  or swelling.  She has a limp to the right. ROM of the right hip is full.  NV intact.  The patient has been educated about the nature of the problem(s) and counseled on treatment options.  The patient appeared to understand what I have discussed and is in agreement with it.  Encounter Diagnosis  Name Primary?  . Trochanteric bursitis, right hip Yes   PROCEDURE NOTE:  The patient request injection, verbal consent was obtained.  The right trochanteric area of the hip was prepped appropriately after time out was performed.   Sterile technique was observed and injection of 1 cc of Depo-Medrol 40 mg with several cc's of plain xylocaine. Anesthesia was provided by ethyl chloride and a 20-gauge needle was used to inject the hip area. The injection was tolerated well.  A band aid dressing was applied.  The patient was advised to apply ice later today and tomorrow to the injection sight as needed.  PLAN Call if any problems.  Precautions discussed.  Continue current medications.   Return to clinic 3 months   Electronically Signed Sanjuana Kava, MD 12/12/20172:10 PM

## 2016-09-06 DIAGNOSIS — E1122 Type 2 diabetes mellitus with diabetic chronic kidney disease: Secondary | ICD-10-CM | POA: Diagnosis not present

## 2016-09-06 DIAGNOSIS — M545 Low back pain: Secondary | ICD-10-CM | POA: Diagnosis not present

## 2016-09-06 DIAGNOSIS — M1711 Unilateral primary osteoarthritis, right knee: Secondary | ICD-10-CM | POA: Diagnosis not present

## 2016-09-06 DIAGNOSIS — M25551 Pain in right hip: Secondary | ICD-10-CM | POA: Diagnosis not present

## 2016-09-06 DIAGNOSIS — I110 Hypertensive heart disease with heart failure: Secondary | ICD-10-CM | POA: Diagnosis not present

## 2016-09-06 DIAGNOSIS — I5022 Chronic systolic (congestive) heart failure: Secondary | ICD-10-CM | POA: Diagnosis not present

## 2016-09-07 DIAGNOSIS — I110 Hypertensive heart disease with heart failure: Secondary | ICD-10-CM | POA: Diagnosis not present

## 2016-09-07 DIAGNOSIS — M25551 Pain in right hip: Secondary | ICD-10-CM | POA: Diagnosis not present

## 2016-09-07 DIAGNOSIS — E1122 Type 2 diabetes mellitus with diabetic chronic kidney disease: Secondary | ICD-10-CM | POA: Diagnosis not present

## 2016-09-07 DIAGNOSIS — M1711 Unilateral primary osteoarthritis, right knee: Secondary | ICD-10-CM | POA: Diagnosis not present

## 2016-09-07 DIAGNOSIS — I5022 Chronic systolic (congestive) heart failure: Secondary | ICD-10-CM | POA: Diagnosis not present

## 2016-09-07 DIAGNOSIS — M545 Low back pain: Secondary | ICD-10-CM | POA: Diagnosis not present

## 2016-09-08 DIAGNOSIS — I110 Hypertensive heart disease with heart failure: Secondary | ICD-10-CM | POA: Diagnosis not present

## 2016-09-08 DIAGNOSIS — I5022 Chronic systolic (congestive) heart failure: Secondary | ICD-10-CM | POA: Diagnosis not present

## 2016-09-08 DIAGNOSIS — M25551 Pain in right hip: Secondary | ICD-10-CM | POA: Diagnosis not present

## 2016-09-08 DIAGNOSIS — M1711 Unilateral primary osteoarthritis, right knee: Secondary | ICD-10-CM | POA: Diagnosis not present

## 2016-09-08 DIAGNOSIS — E1122 Type 2 diabetes mellitus with diabetic chronic kidney disease: Secondary | ICD-10-CM | POA: Diagnosis not present

## 2016-09-08 DIAGNOSIS — M545 Low back pain: Secondary | ICD-10-CM | POA: Diagnosis not present

## 2016-09-11 ENCOUNTER — Other Ambulatory Visit: Payer: Self-pay

## 2016-09-11 MED ORDER — ONDANSETRON HCL 4 MG PO TABS: 4.0000 mg | ORAL_TABLET | Freq: Every day | ORAL | 0 refills | Status: DC | PRN

## 2016-09-12 ENCOUNTER — Other Ambulatory Visit (HOSPITAL_COMMUNITY): Payer: Self-pay

## 2016-09-12 DIAGNOSIS — I110 Hypertensive heart disease with heart failure: Secondary | ICD-10-CM | POA: Diagnosis not present

## 2016-09-12 DIAGNOSIS — E1122 Type 2 diabetes mellitus with diabetic chronic kidney disease: Secondary | ICD-10-CM | POA: Diagnosis not present

## 2016-09-12 DIAGNOSIS — M1711 Unilateral primary osteoarthritis, right knee: Secondary | ICD-10-CM | POA: Diagnosis not present

## 2016-09-12 DIAGNOSIS — M25551 Pain in right hip: Secondary | ICD-10-CM | POA: Diagnosis not present

## 2016-09-12 DIAGNOSIS — D649 Anemia, unspecified: Secondary | ICD-10-CM

## 2016-09-12 DIAGNOSIS — M545 Low back pain: Secondary | ICD-10-CM | POA: Diagnosis not present

## 2016-09-12 DIAGNOSIS — I5022 Chronic systolic (congestive) heart failure: Secondary | ICD-10-CM | POA: Diagnosis not present

## 2016-09-12 DIAGNOSIS — E611 Iron deficiency: Secondary | ICD-10-CM

## 2016-09-13 ENCOUNTER — Encounter (HOSPITAL_COMMUNITY): Payer: Self-pay

## 2016-09-13 ENCOUNTER — Ambulatory Visit (INDEPENDENT_AMBULATORY_CARE_PROVIDER_SITE_OTHER): Payer: Medicare Other | Admitting: *Deleted

## 2016-09-13 ENCOUNTER — Encounter (HOSPITAL_COMMUNITY): Payer: Medicare Other | Attending: Hematology & Oncology

## 2016-09-13 ENCOUNTER — Encounter (HOSPITAL_COMMUNITY): Payer: Medicare Other

## 2016-09-13 VITALS — BP 120/56 | HR 71 | Resp 16

## 2016-09-13 DIAGNOSIS — N184 Chronic kidney disease, stage 4 (severe): Secondary | ICD-10-CM

## 2016-09-13 DIAGNOSIS — Z952 Presence of prosthetic heart valve: Secondary | ICD-10-CM

## 2016-09-13 DIAGNOSIS — Z5181 Encounter for therapeutic drug level monitoring: Secondary | ICD-10-CM

## 2016-09-13 DIAGNOSIS — C184 Malignant neoplasm of transverse colon: Secondary | ICD-10-CM | POA: Diagnosis not present

## 2016-09-13 DIAGNOSIS — D649 Anemia, unspecified: Secondary | ICD-10-CM | POA: Diagnosis not present

## 2016-09-13 DIAGNOSIS — E611 Iron deficiency: Secondary | ICD-10-CM

## 2016-09-13 DIAGNOSIS — D631 Anemia in chronic kidney disease: Secondary | ICD-10-CM

## 2016-09-13 LAB — CBC WITH DIFFERENTIAL/PLATELET
Basophils Absolute: 0 10*3/uL (ref 0.0–0.1)
Basophils Relative: 0 %
Eosinophils Absolute: 0 10*3/uL (ref 0.0–0.7)
Eosinophils Relative: 0 %
HCT: 33.7 % — ABNORMAL LOW (ref 36.0–46.0)
Hemoglobin: 10.8 g/dL — ABNORMAL LOW (ref 12.0–15.0)
Lymphocytes Relative: 16 %
Lymphs Abs: 1 10*3/uL (ref 0.7–4.0)
MCH: 30.3 pg (ref 26.0–34.0)
MCHC: 32 g/dL (ref 30.0–36.0)
MCV: 94.7 fL (ref 78.0–100.0)
Monocytes Absolute: 0.5 10*3/uL (ref 0.1–1.0)
Monocytes Relative: 7 %
Neutro Abs: 4.8 10*3/uL (ref 1.7–7.7)
Neutrophils Relative %: 77 %
Platelets: 156 10*3/uL (ref 150–400)
RBC: 3.56 MIL/uL — ABNORMAL LOW (ref 3.87–5.11)
RDW: 16.3 % — ABNORMAL HIGH (ref 11.5–15.5)
WBC: 6.2 10*3/uL (ref 4.0–10.5)

## 2016-09-13 LAB — POCT INR: INR: 2.7

## 2016-09-13 LAB — FERRITIN: Ferritin: 758 ng/mL — ABNORMAL HIGH (ref 11–307)

## 2016-09-13 MED ORDER — EPOETIN ALFA 40000 UNIT/ML IJ SOLN
INTRAMUSCULAR | Status: AC
  Filled 2016-09-13: qty 1

## 2016-09-13 MED ORDER — EPOETIN ALFA 40000 UNIT/ML IJ SOLN
40000.0000 [IU] | Freq: Once | INTRAMUSCULAR | Status: AC
  Administered 2016-09-13: 40000 [IU] via SUBCUTANEOUS

## 2016-09-13 NOTE — Progress Notes (Signed)
[name] [name] presents today for injection per MD orders. Procrit 40,0109mcg administered SQ in left Abdomen. Administration without incident. Patient tolerated well. Vitals stable and discharged from clinic via wheelchair from clinic with family. Follow up as scheduled.

## 2016-09-13 NOTE — Patient Instructions (Signed)
[place] at [facility] Discharge Instructions  RECOMMENDATIONS MADE BY THE CONSULTANT AND ANY TEST RESULTS WILL BE SENT TO YOUR REFERRING PHYSICIAN.  Procrit given today Follow up as scheduled  Thank you for choosing [place] at [facility] to provide your oncology and hematology care.  To afford each patient quality time with our provider, please arrive at least 15 minutes before your scheduled appointment time.   Beginning January 23rd 2017 lab work for the [facility] will be done in the  Main lab at [facility] on 1st floor. If you have a lab appointment with the [place] please come in thru the  Main Entrance and check in at the main information desk  You need to re-schedule your appointment should you arrive 10 or more minutes late.  We strive to give you quality time with our providers, and arriving late affects you and other patients whose appointments are after yours.  Also, if you no show three or more times for appointments you may be dismissed from the clinic at the providers discretion.     Again, thank you for choosing [facility].  Our hope is that these requests will decrease the amount of time that you wait before being seen by our physicians.       _____________________________________________________________  Should you have questions after your visit to [facility], please contact our office at [phone] between the hours of 8:30 a.m. and 4:30 p.m.  Voicemails left after 4:30 p.m. will not be returned until the following business day.  For prescription refill requests, have your pharmacy contact our office.         Resources For Cancer Patients and their Caregivers ? American Cancer Society: Can assist with transportation, wigs, general needs, runs Look Good Feel Better.        639 216 0803 ? Cancer Care: Provides financial assistance, online support groups, medication/co-pay  assistance.  1-800-813-HOPE 573 734 4404) ? [place] Assists [place] cancer patients and their families through emotional , educational and financial support.  (727)564-5422 ? [place] DSS Where to apply for food stamps, Medicaid and utility assistance. (905)373-4844 ? [facility]: Transportation to medical appointments. 501-783-3233 ? Social Security Administration: May apply for disability if have a Stage IV cancer. 713-124-0381 (416)680-8468 ? [employer], Disability and Transit Services: Assists with nutrition, care and transit needs. [place] Support Programs: @10RELATIVEDAYS @ > Cancer Support Group  2nd Tuesday of the month 1pm-2pm, Journey Room  > Creative Journey  3rd Tuesday of the month 1130am-1pm, Journey Room  > Look Good Feel Better  1st Wednesday of the month 10am-12 noon, Journey Room (Call [place] to register [phone])

## 2016-09-14 DIAGNOSIS — M545 Low back pain: Secondary | ICD-10-CM | POA: Diagnosis not present

## 2016-09-14 DIAGNOSIS — I110 Hypertensive heart disease with heart failure: Secondary | ICD-10-CM | POA: Diagnosis not present

## 2016-09-14 DIAGNOSIS — M25551 Pain in right hip: Secondary | ICD-10-CM | POA: Diagnosis not present

## 2016-09-14 DIAGNOSIS — M1711 Unilateral primary osteoarthritis, right knee: Secondary | ICD-10-CM | POA: Diagnosis not present

## 2016-09-14 DIAGNOSIS — I5022 Chronic systolic (congestive) heart failure: Secondary | ICD-10-CM | POA: Diagnosis not present

## 2016-09-14 DIAGNOSIS — E1122 Type 2 diabetes mellitus with diabetic chronic kidney disease: Secondary | ICD-10-CM | POA: Diagnosis not present

## 2016-09-19 ENCOUNTER — Other Ambulatory Visit: Payer: Self-pay

## 2016-09-19 NOTE — Patient Outreach (Signed)
[place] [facility]) Care Management  [place]  09/19/2016   Meylin Stenzel 09/25/1932 [id number]  Subjective: Telephone call to patient for monthly call. Patient reports she is doing pretty good. She reports that her last weight was 221 lbs.  Discussed with patient heart failure and importance of diet ans daily weights. She verbalized understanding.   Objective:   Encounter Medications:  Outpatient Encounter Prescriptions as of 09/19/2016  Medication Sig Note  . acetaminophen (TYLENOL) 500 MG tablet Take 500 mg by mouth every 6 (six) hours as needed (pain).   Marland Kitchen albuterol (PROVENTIL HFA;VENTOLIN HFA) 108 (90 BASE) MCG/ACT inhaler Inhale 2 puffs into the lungs every 6 (six) hours as needed for wheezing or shortness of breath.   Marland Kitchen albuterol (PROVENTIL) (2.5 MG/3ML) 0.083% nebulizer solution INHALE 1 VIAL VIA NEBULIZER THREE TIMES DAILY.   Marland Kitchen allopurinol (ZYLOPRIM) 100 MG tablet Take 1 tablet (100 mg total) by mouth daily.   Marland Kitchen azelastine (ASTELIN) 0.1 % nasal spray Place 2 sprays into both nostrils 2 (two) times daily. Use in each nostril as directed   . benzonatate (TESSALON) 100 MG capsule Take 1 capsule (100 mg total) by mouth 2 (two) times daily as needed for cough.   . calcitRIOL (ROCALTROL) 0.25 MCG capsule Take 1 capsule by mouth 3 (three) times a week. 03/31/2016: Received from: External Pharmacy Received Sig: TAKE 1 TAB ON [place], AND FRIDAYS.  Marland Kitchen cholecalciferol (VITAMIN D) 1000 UNITS tablet Take 1,000 Units by mouth daily.   Marland Kitchen COUMADIN 5 MG tablet Take 2 tablets daily   . digoxin (LANOXIN) 0.125 MG tablet Take 1 tablet (0.125 mg total) by mouth every other day.   . Diphenhyd-Hydrocort-Nystatin (FIRST-DUKES MOUTHWASH) SUSP 2 tsp three times daily, gargle, swish and swallow for 3 days, then as needed, up to 1 week total   . docusate sodium (COLACE) 100 MG capsule Take 300 mg by mouth at bedtime.   Marland Kitchen ezetimibe-simvastatin (VYTORIN) 10-20 MG tablet Take 1  tablet by mouth 3  times weekly   . fluticasone (FLONASE) 50 MCG/ACT nasal spray instill 2 sprays into each nostril once daily   . fluticasone furoate-vilanterol (BREO ELLIPTA) 100-25 MCG/INH AEPB Inhale 1 puff into the lungs daily.   . folic acid (FOLVITE) 1 MG tablet Take 1 tablet (1 mg total) by mouth daily.   . furosemide (LASIX) 40 MG tablet Take 1 tablet by mouth  daily   . gabapentin (NEURONTIN) 300 MG capsule Take 1 capsule (300 mg total) by mouth 2 (two) times daily.   . hydrALAZINE (APRESOLINE) 25 MG tablet Take 1 tablet (25 mg total) by mouth 3 (three) times daily.   Marland Kitchen HYDROcodone-acetaminophen (NORCO) 7.5-325 MG tablet Take 1 tablet by mouth every 6 (six) hours as needed for moderate pain (Must last 30 days.Do not drive or operate machinery while taking this medicine.).   Marland Kitchen isosorbide mononitrate (IMDUR) 60 MG 24 hr tablet Take 1 tablet (60 mg total) by mouth daily.   Marland Kitchen losartan (COZAAR) 50 MG tablet Take 1 tablet by mouth  daily   . meclizine (ANTIVERT) 12.5 MG tablet take 1 tablet by mouth twice a day if needed for dizziness   . metolazone (ZAROXOLYN) 2.5 MG tablet Take 1 tablet (2.5 mg total) by mouth once a week.   . ondansetron (ZOFRAN) 4 MG tablet Take 1 tablet (4 mg total) by mouth daily as needed for nausea or vomiting.   [name] [name] (SYSTANE OP) Place 1 drop into both eyes  daily as needed (dry eyes).   . potassium chloride SA (KLOR-CON M20) 20 MEQ tablet Take 1 tablet by mouth  daily   . pravastatin (PRAVACHOL) 20 MG tablet Take 1 tablet (20 mg total) by mouth daily.   Marland Kitchen PROAIR HFA 108 (90 Base) MCG/ACT inhaler inhale 2 puffs by mouth every 6 hours if needed for wheezing or shortness of breath   . SYNTHROID 75 MCG tablet Take 1 tablet by mouth  daily   . temazepam (RESTORIL) 30 MG capsule Take 30 mg by mouth at bedtime as needed for sleep.   . TOPROL XL 100 MG 24 hr tablet Take 1 tablet (100 mg total) by mouth daily. Take with or immediately following a  meal.   . umeclidinium bromide (INCRUSE ELLIPTA) 62.5 MCG/INH AEPB Inhale 1 puff into the lungs daily.   . ciprofloxacin (CIPRO) 500 MG tablet Take 1 tablet (500 mg total) by mouth 2 (two) times daily. (Patient not taking: Reported on 09/19/2016)   . montelukast (SINGULAIR) 10 MG tablet Take 1 tablet (10 mg total) by mouth at bedtime. (Patient not taking: Reported on 09/19/2016)   . promethazine-dextromethorphan (PROMETHAZINE-DM) 6.25-15 MG/5ML syrup One teaspoon at bedtime as needed, for cough (Patient not taking: Reported on 09/19/2016)    Facility-Administered Encounter Medications as of 09/19/2016  Medication  . epoetin alfa (EPOGEN,PROCRIT) injection 40,000 Units    Functional Status:  In your present state of health, do you have any difficulty performing the following activities: 08/08/2016 04/24/2016  Hearing? N N  Vision? N N  Difficulty concentrating or making decisions? N Y  Walking or climbing stairs? N Y  Dressing or bathing? N Y  Doing errands, shopping? [place]  Preparing Food and eating ? N N  Using the Toilet? N N  In the past six months, have you accidently leaked urine? N N  Do you have problems with loss of bowel control? N N  Managing your Medications? N N  Managing your Finances? N N  Housekeeping or managing your Housekeeping? N N  Some recent data might be hidden    Fall/Depression Screening: PHQ 2/9 Scores 09/19/2016 08/08/2016 07/05/2016 04/24/2016 04/24/2016 04/17/2016 10/21/2015  PHQ - 2 Score 0 0 0 2 2 1  0  PHQ- 9 Score - - - 9 6 - -    Assessment: Patient continues to benefit from health coach outreach for disease management and support.    Plan:  [facility] CM Care Plan Problem One   Flowsheet Row Most Recent Value  Care Plan Problem One  Heart Failure knowledge deficit  Role Documenting the Problem One  Health Coach  Care Plan for Problem One  Active  THN Long Term Goal (31-90 days)  Patient will become familiar with heart failure disease process and be able  to verbalize heart failure zones for self management wtihin 90 days.    THN Long Term Goal Start Date  09/19/16  Interventions for Problem One Long Term Goal  RN Health Coach reiterated heart failure zones and when to notify physician.       RN Health Coach will contact patient in the month of January and patient agrees to next outreach.  Jone Baseman, RN, MSN [place] [phone]

## 2016-09-20 ENCOUNTER — Other Ambulatory Visit: Payer: Self-pay | Admitting: Licensed Clinical Social Worker

## 2016-09-20 NOTE — Patient Outreach (Signed)
Assessment:  CSW spoke via phone with client. CSW verified client identity. CSW received verbal permission from client on 09/20/16 for CSW to speak with client about current client needs. Client does live alone, She sees Dr.Simspon as primary care doctor. Client said she has an appointment scheduled for next month with Dr. [name].    She said she had her prescribed medications and is taking medications as prescribed. Client uses a cane or walker to help her with ambulation. Client said she has some family support in transporting her to and from client's scheduled medical appointments.   CSW has previously talked with client about [employer] agency. CSW has encouraged client to call [employer] to discuss transport needs of client and customary costs for transport assistance through [employer] agency. CSW and client spoke of client care plan.  CSW encouraged client to communicate with CSW in next 30 days to discuss community resources for transport assistance for client. Client said she uses oxygen as prescribed to help her with breathing at night. She said she uses inhalers as prescribed to assist her with breathing needs. Client is receiving support from [place], Jon Billings RN.  Client said she takes pain medication as prescribed. She said she had appointment with Dr. Luna Glasgow on 09/05/16 and received prescribed  Injection for client in right hip of client. Client is scheduled to see Dr. Luna Glasgow for appointment in 3 months.  Client said injection she received from Dr. Luna Glasgow in December of 2017 was to help clent with pain management in right hip for client. Client said she continued to receive some help from family members in transporting client to and from client's scheduled medical appointments.  CSW and client spoke of [place]. Client said she was aware of [employer] transport services and had used [facility] transport help in the past.  CSW  encouraged client to use [facility] transport services, if needed, for transport assistance for client. Client said she was managing pain in her right hip. She said she did take pain medication as prescribed.  She and CSW spoke of support client received with [place], Jon Billings, RN.  CSW thanked client for phone call with CSW on 09/20/16. CSW encouraged [place] to call CSW at [phone] as needed to discuss social work needs of client.     Plan:  Client to communicate with CSW in next 30 days to discuss community resources for transport assistance for client.    CSW to call client in 4 weeks to assess client needs.  [name].Rosendo Couser MSW, LCSW Licensed Clinical Social Worker [facility] Care Management [phone]

## 2016-09-26 ENCOUNTER — Ambulatory Visit (INDEPENDENT_AMBULATORY_CARE_PROVIDER_SITE_OTHER): Payer: Medicare Other | Admitting: *Deleted

## 2016-09-26 DIAGNOSIS — I255 Ischemic cardiomyopathy: Secondary | ICD-10-CM

## 2016-09-27 DIAGNOSIS — H04122 Dry eye syndrome of left lacrimal gland: Secondary | ICD-10-CM | POA: Diagnosis not present

## 2016-09-27 DIAGNOSIS — H16222 Keratoconjunctivitis sicca, not specified as Sjogren's, left eye: Secondary | ICD-10-CM | POA: Diagnosis not present

## 2016-09-27 DIAGNOSIS — H02402 Unspecified ptosis of left eyelid: Secondary | ICD-10-CM | POA: Diagnosis not present

## 2016-09-29 ENCOUNTER — Encounter: Payer: Self-pay | Admitting: Cardiology

## 2016-09-29 ENCOUNTER — Telehealth: Payer: Self-pay

## 2016-09-29 LAB — CUP PACEART REMOTE DEVICE CHECK
Battery Remaining Longevity: 26 mo
Battery Voltage: 2.94 V
Brady Statistic AP VP Percent: 18.73 %
Brady Statistic AP VS Percent: 0 %
Brady Statistic AS VP Percent: 80.54 %
Brady Statistic AS VS Percent: 0.72 %
Brady Statistic RA Percent Paced: 17.49 %
Brady Statistic RV Percent Paced: 99.29 %
Date Time Interrogation Session: 20180102073325
HighPow Impedance: 34 Ohm
HighPow Impedance: 43 Ohm
Implantable Lead Implant Date: 20040521
Implantable Lead Implant Date: 20071218
Implantable Lead Implant Date: 20160516
Implantable Lead Location: 753858
Implantable Lead Location: 753859
Implantable Lead Location: 753860
Implantable Lead Model: 5076
Implantable Lead Model: 6947
Implantable Pulse Generator Implant Date: 20160516
Lead Channel Impedance Value: 285 Ohm
Lead Channel Impedance Value: 342 Ohm
Lead Channel Impedance Value: 361 Ohm
Lead Channel Impedance Value: 361 Ohm
Lead Channel Impedance Value: 361 Ohm
Lead Channel Impedance Value: 418 Ohm
Lead Channel Impedance Value: 456 Ohm
Lead Channel Impedance Value: 551 Ohm
Lead Channel Impedance Value: 608 Ohm
Lead Channel Impedance Value: 646 Ohm
Lead Channel Impedance Value: 646 Ohm
Lead Channel Impedance Value: 665 Ohm
Lead Channel Impedance Value: 722 Ohm
Lead Channel Pacing Threshold Amplitude: 0.5 V
Lead Channel Pacing Threshold Amplitude: 1.5 V
Lead Channel Pacing Threshold Amplitude: 5 V
Lead Channel Pacing Threshold Pulse Width: 0.4 ms
Lead Channel Pacing Threshold Pulse Width: 0.4 ms
Lead Channel Pacing Threshold Pulse Width: 1.5 ms
Lead Channel Sensing Intrinsic Amplitude: 1.875 mV
Lead Channel Sensing Intrinsic Amplitude: 1.875 mV
Lead Channel Sensing Intrinsic Amplitude: 10.5 mV
Lead Channel Sensing Intrinsic Amplitude: 10.5 mV
Lead Channel Setting Pacing Amplitude: 2 V
Lead Channel Setting Pacing Amplitude: 2 V
Lead Channel Setting Pacing Amplitude: 2.5 V
Lead Channel Setting Pacing Pulse Width: 0.6 ms
Lead Channel Setting Pacing Pulse Width: 1 ms
Lead Channel Setting Sensing Sensitivity: 0.3 mV

## 2016-09-29 NOTE — Telephone Encounter (Signed)
Called pt d/t to heart rates on remote, pt stated she notices her heart beating hard sometimes and some SOB, but otherwise she is feeling fine. Informed pt that information would be reviewed w/ Dr. Lovena Le and would call her back if he has any further recommendations. Pt voiced understanding and appreciative of call.

## 2016-10-03 ENCOUNTER — Telehealth: Payer: Self-pay

## 2016-10-03 ENCOUNTER — Other Ambulatory Visit (HOSPITAL_COMMUNITY): Payer: Self-pay | Admitting: *Deleted

## 2016-10-03 DIAGNOSIS — E611 Iron deficiency: Secondary | ICD-10-CM

## 2016-10-03 NOTE — Telephone Encounter (Signed)
At her age and with co morbidities, unsafe , and not in her best interest to "treat empirically" pls encourage her to seek transport aid, NEEDS meedication I believe , DOES need evaluation

## 2016-10-04 ENCOUNTER — Ambulatory Visit (INDEPENDENT_AMBULATORY_CARE_PROVIDER_SITE_OTHER): Payer: Medicare Other | Admitting: Family Medicine

## 2016-10-04 ENCOUNTER — Encounter (HOSPITAL_COMMUNITY): Payer: Self-pay

## 2016-10-04 ENCOUNTER — Encounter (HOSPITAL_COMMUNITY): Payer: Medicare Other | Attending: Hematology & Oncology

## 2016-10-04 ENCOUNTER — Encounter (HOSPITAL_COMMUNITY): Payer: Medicare Other

## 2016-10-04 ENCOUNTER — Encounter: Payer: Self-pay | Admitting: Family Medicine

## 2016-10-04 VITALS — BP 142/56 | HR 69 | Temp 99.3°F | Resp 18

## 2016-10-04 VITALS — BP 140/70 | HR 68 | Temp 99.2°F | Resp 16 | Ht 67.0 in | Wt 219.0 lb

## 2016-10-04 DIAGNOSIS — I442 Atrioventricular block, complete: Secondary | ICD-10-CM | POA: Diagnosis not present

## 2016-10-04 DIAGNOSIS — E611 Iron deficiency: Secondary | ICD-10-CM | POA: Insufficient documentation

## 2016-10-04 DIAGNOSIS — I255 Ischemic cardiomyopathy: Secondary | ICD-10-CM

## 2016-10-04 DIAGNOSIS — N184 Chronic kidney disease, stage 4 (severe): Secondary | ICD-10-CM | POA: Diagnosis present

## 2016-10-04 DIAGNOSIS — Z9981 Dependence on supplemental oxygen: Secondary | ICD-10-CM

## 2016-10-04 DIAGNOSIS — E559 Vitamin D deficiency, unspecified: Secondary | ICD-10-CM

## 2016-10-04 DIAGNOSIS — J42 Unspecified chronic bronchitis: Secondary | ICD-10-CM | POA: Diagnosis not present

## 2016-10-04 DIAGNOSIS — E890 Postprocedural hypothyroidism: Secondary | ICD-10-CM

## 2016-10-04 DIAGNOSIS — I10 Essential (primary) hypertension: Secondary | ICD-10-CM

## 2016-10-04 DIAGNOSIS — D631 Anemia in chronic kidney disease: Secondary | ICD-10-CM

## 2016-10-04 DIAGNOSIS — E1122 Type 2 diabetes mellitus with diabetic chronic kidney disease: Secondary | ICD-10-CM | POA: Diagnosis not present

## 2016-10-04 DIAGNOSIS — E785 Hyperlipidemia, unspecified: Secondary | ICD-10-CM | POA: Diagnosis not present

## 2016-10-04 DIAGNOSIS — G4734 Idiopathic sleep related nonobstructive alveolar hypoventilation: Secondary | ICD-10-CM | POA: Diagnosis not present

## 2016-10-04 DIAGNOSIS — K8010 Calculus of gallbladder with chronic cholecystitis without obstruction: Secondary | ICD-10-CM

## 2016-10-04 DIAGNOSIS — J209 Acute bronchitis, unspecified: Secondary | ICD-10-CM | POA: Diagnosis not present

## 2016-10-04 DIAGNOSIS — D649 Anemia, unspecified: Secondary | ICD-10-CM

## 2016-10-04 LAB — CBC
HCT: 32.7 % — ABNORMAL LOW (ref 36.0–46.0)
Hemoglobin: 10.8 g/dL — ABNORMAL LOW (ref 12.0–15.0)
MCH: 30.9 pg (ref 26.0–34.0)
MCHC: 33 g/dL (ref 30.0–36.0)
MCV: 93.4 fL (ref 78.0–100.0)
Platelets: 163 10*3/uL (ref 150–400)
RBC: 3.5 MIL/uL — ABNORMAL LOW (ref 3.87–5.11)
RDW: 16.6 % — ABNORMAL HIGH (ref 11.5–15.5)
WBC: 3.9 10*3/uL — ABNORMAL LOW (ref 4.0–10.5)

## 2016-10-04 MED ORDER — AZITHROMYCIN 250 MG PO TABS: ORAL_TABLET | ORAL | 0 refills | Status: DC

## 2016-10-04 MED ORDER — PROMETHAZINE-DM 6.25-15 MG/5ML PO SYRP: ORAL_SOLUTION | ORAL | 0 refills | Status: DC

## 2016-10-04 MED ORDER — BENZONATATE 100 MG PO CAPS: 100.0000 mg | ORAL_CAPSULE | Freq: Two times a day (BID) | ORAL | 0 refills | Status: DC | PRN

## 2016-10-04 MED ORDER — EPOETIN ALFA 40000 UNIT/ML IJ SOLN
40000.0000 [IU] | Freq: Once | INTRAMUSCULAR | Status: AC
  Administered 2016-10-04: 40000 [IU] via SUBCUTANEOUS

## 2016-10-04 NOTE — Progress Notes (Unsigned)
[name] [name] presents today for injection per MD orders. {CHL ONC AP INJECTABLE MEDS:115400409} administered SQ in {left/right:311354} {CHL ONC AP SQ LOCATIONS:115400410}. Administration without incident. Patient tolerated well.

## 2016-10-04 NOTE — Patient Instructions (Addendum)
F/u as before  Call if you need me before  Medication sent in for acute bronchitis,. z pack, tessalon perle and cough syrup  Fasting lipid, cmp and eGFR, tSH and vit D 1 week before next visit.  Require oxygen based on health conditions   Thank you  for choosing [facility]. We consider it a privelige to serve you.  Delivering excellent health care in a caring and  compassionate way is our goal.  Partnering with you,  so that together we can achieve this goal is our strategy.

## 2016-10-04 NOTE — Telephone Encounter (Signed)
Spoke with patient.  She can come in today.  Verbal received from MD to work patient in this afternoon

## 2016-10-08 ENCOUNTER — Encounter: Payer: Self-pay | Admitting: Family Medicine

## 2016-10-08 DIAGNOSIS — G4734 Idiopathic sleep related nonobstructive alveolar hypoventilation: Secondary | ICD-10-CM | POA: Insufficient documentation

## 2016-10-08 DIAGNOSIS — Z9981 Dependence on supplemental oxygen: Secondary | ICD-10-CM | POA: Insufficient documentation

## 2016-10-08 DIAGNOSIS — J209 Acute bronchitis, unspecified: Secondary | ICD-10-CM | POA: Insufficient documentation

## 2016-10-08 NOTE — Assessment & Plan Note (Signed)
Need for supplemental nocturnal oxygen established by pulse oximetry, also her co morbidity justifies this alsos of CHF

## 2016-10-08 NOTE — Assessment & Plan Note (Signed)
Ongoing need for supplemental oxygen, as chronic health conditions requiring supplementation are unchanged

## 2016-10-08 NOTE — Progress Notes (Signed)
   [name] [name]     MRN: [id number]      DOB: 09-14-1932   HPI [name] [name]  C/o 1 week of excess cough , wheeze and chest congestion, intermittent chill, sputum mainly white. Last night symptoms worsened with increased shortness of breath and fatigue. Denies sinus pressure, nasal drainage and ear pain Chronic back and lower extremity pain managed by orthopedics Visit will also serve to justify her need for ongoing nocturnal oxygen . [place] has established nocturnal hypoxia on testing, she also has severe heart failure and COPD, all of these are lifetime co morbidities which necessitate lifelong SUPPLEMENTAL  Nocturnal oxygen ROS  Denies chest pains, palpitations and leg swelling Denies abdominal pain, nausea, vomiting,diarrhea or constipation.   Denies dysuria, frequency, hesitancy or incontinence.  Denies headaches, seizures, numbness, or tingling. Denies depression, anxiety c/o  Insomnia due to excess cough Denies skin break down or rash.   PE  BP 140/70   Pulse 68   Temp 99.2 F (37.3 C) (Oral)   Resp 16   Ht 5\' 7"  (1.702 m)   Wt 219 lb (99.3 kg)   SpO2 91%   BMI 34.30 kg/m   Patient alert and oriented and in mild  cardiopulmonary distress.  HEENT: No facial asymmetry, EOMI,   oropharynx pink and moist.  Neck supple no JVD, no mass.  Chest: decreased air entry bilaterally with scattered crackles and few wheezes  CVS: S1, S2 systolic  Murmur and click , no S3.Regular rate.  ABD: Soft non tender.   Ext: No edema  MS: Decreased ROM spine, shoulders, hips and knees.  Skin: Intact, no ulcerations or rash noted.  Psych: Good eye contact, normal affect. Memory intact not anxious or depressed appearing.  CNS: CN 2-12 intact, power,  normal throughout.no focal deficits noted.   Assessment & Plan  Acute bronchitis Decongestant , cough suppressant and Z pack prescribed, pt to call if worsens or not improved in next 1 week  CHB (complete heart block) Pacemaker  dependent, needs cardiology f/u will arrange at next visit  Calculus of gallbladder Chronic nausea, risk of surgery outweighs benefit  COPD (chronic obstructive pulmonary disease) Need for supplemental nocturnal oxygen established by pulse oximetry, also her co morbidity justifies this alsos of CHF  Nocturnal hypoxia Ongoing need for supplemental oxygen, as chronic health conditions requiring supplementation are unchanged  Dependence on nocturnal oxygen therapy Lifetime need established based on co morbidities of heart failure, nocturnal hypoxia and COPD

## 2016-10-08 NOTE — Assessment & Plan Note (Signed)
Decongestant , cough suppressant and Z pack prescribed, pt to call if worsens or not improved in next 1 week

## 2016-10-08 NOTE — Assessment & Plan Note (Signed)
Pacemaker dependent, needs cardiology f/u will arrange at next visit

## 2016-10-08 NOTE — Assessment & Plan Note (Signed)
Chronic nausea, risk of surgery outweighs benefit

## 2016-10-08 NOTE — Assessment & Plan Note (Signed)
Lifetime need established based on co morbidities of heart failure, nocturnal hypoxia and COPD

## 2016-10-09 ENCOUNTER — Emergency Department (HOSPITAL_COMMUNITY)
Admission: EM | Admit: 2016-10-09 | Discharge: 2016-10-09 | Disposition: A | Discharge status: Home / Self Care | Payer: Medicare Other | Attending: Emergency Medicine | Admitting: Emergency Medicine

## 2016-10-09 ENCOUNTER — Emergency Department (HOSPITAL_COMMUNITY): Payer: Medicare Other

## 2016-10-09 ENCOUNTER — Other Ambulatory Visit: Payer: Self-pay

## 2016-10-09 ENCOUNTER — Other Ambulatory Visit: Payer: Self-pay | Admitting: Family Medicine

## 2016-10-09 ENCOUNTER — Telehealth: Payer: Self-pay

## 2016-10-09 ENCOUNTER — Encounter (HOSPITAL_COMMUNITY): Payer: Self-pay | Admitting: Emergency Medicine

## 2016-10-09 DIAGNOSIS — E039 Hypothyroidism, unspecified: Secondary | ICD-10-CM | POA: Diagnosis not present

## 2016-10-09 DIAGNOSIS — Z85850 Personal history of malignant neoplasm of thyroid: Secondary | ICD-10-CM | POA: Insufficient documentation

## 2016-10-09 DIAGNOSIS — J441 Chronic obstructive pulmonary disease with (acute) exacerbation: Secondary | ICD-10-CM | POA: Diagnosis not present

## 2016-10-09 DIAGNOSIS — Z87891 Personal history of nicotine dependence: Secondary | ICD-10-CM | POA: Insufficient documentation

## 2016-10-09 DIAGNOSIS — J069 Acute upper respiratory infection, unspecified: Secondary | ICD-10-CM | POA: Diagnosis not present

## 2016-10-09 DIAGNOSIS — B9789 Other viral agents as the cause of diseases classified elsewhere: Secondary | ICD-10-CM

## 2016-10-09 DIAGNOSIS — Z79899 Other long term (current) drug therapy: Secondary | ICD-10-CM | POA: Insufficient documentation

## 2016-10-09 DIAGNOSIS — J449 Chronic obstructive pulmonary disease, unspecified: Secondary | ICD-10-CM | POA: Insufficient documentation

## 2016-10-09 DIAGNOSIS — E119 Type 2 diabetes mellitus without complications: Secondary | ICD-10-CM | POA: Diagnosis not present

## 2016-10-09 DIAGNOSIS — I5022 Chronic systolic (congestive) heart failure: Secondary | ICD-10-CM | POA: Insufficient documentation

## 2016-10-09 DIAGNOSIS — R05 Cough: Secondary | ICD-10-CM

## 2016-10-09 DIAGNOSIS — N184 Chronic kidney disease, stage 4 (severe): Secondary | ICD-10-CM | POA: Diagnosis not present

## 2016-10-09 DIAGNOSIS — Z950 Presence of cardiac pacemaker: Secondary | ICD-10-CM | POA: Diagnosis not present

## 2016-10-09 DIAGNOSIS — E1122 Type 2 diabetes mellitus with diabetic chronic kidney disease: Secondary | ICD-10-CM | POA: Insufficient documentation

## 2016-10-09 DIAGNOSIS — I130 Hypertensive heart and chronic kidney disease with heart failure and stage 1 through stage 4 chronic kidney disease, or unspecified chronic kidney disease: Secondary | ICD-10-CM | POA: Diagnosis not present

## 2016-10-09 DIAGNOSIS — J209 Acute bronchitis, unspecified: Secondary | ICD-10-CM

## 2016-10-09 DIAGNOSIS — R059 Cough, unspecified: Secondary | ICD-10-CM

## 2016-10-09 LAB — CBC WITH DIFFERENTIAL/PLATELET
Basophils Absolute: 0 10*3/uL (ref 0.0–0.1)
Basophils Relative: 0 %
Eosinophils Absolute: 0 10*3/uL (ref 0.0–0.7)
Eosinophils Relative: 0 %
HCT: 35.1 % — ABNORMAL LOW (ref 36.0–46.0)
Hemoglobin: 11.4 g/dL — ABNORMAL LOW (ref 12.0–15.0)
Lymphocytes Relative: 15 %
Lymphs Abs: 0.9 10*3/uL (ref 0.7–4.0)
MCH: 30.4 pg (ref 26.0–34.0)
MCHC: 32.5 g/dL (ref 30.0–36.0)
MCV: 93.6 fL (ref 78.0–100.0)
Monocytes Absolute: 0.6 10*3/uL (ref 0.1–1.0)
Monocytes Relative: 9 %
Neutro Abs: 4.7 10*3/uL (ref 1.7–7.7)
Neutrophils Relative %: 76 %
Platelets: 218 10*3/uL (ref 150–400)
RBC: 3.75 MIL/uL — ABNORMAL LOW (ref 3.87–5.11)
RDW: 16.4 % — ABNORMAL HIGH (ref 11.5–15.5)
WBC: 6.2 10*3/uL (ref 4.0–10.5)

## 2016-10-09 LAB — BASIC METABOLIC PANEL
Anion gap: 8 (ref 5–15)
BUN: 53 mg/dL — ABNORMAL HIGH (ref 6–20)
CO2: 27 mmol/L (ref 22–32)
Calcium: 9.2 mg/dL (ref 8.9–10.3)
Chloride: 99 mmol/L — ABNORMAL LOW (ref 101–111)
Creatinine, Ser: 1.96 mg/dL — ABNORMAL HIGH (ref 0.44–1.00)
GFR calc Af Amer: 26 mL/min — ABNORMAL LOW (ref >60)
GFR calc non Af Amer: 23 mL/min — ABNORMAL LOW (ref >60)
Glucose, Bld: 100 mg/dL — ABNORMAL HIGH (ref 65–99)
Potassium: 4.9 mmol/L (ref 3.5–5.1)
Sodium: 134 mmol/L — ABNORMAL LOW (ref 135–145)

## 2016-10-09 LAB — BRAIN NATRIURETIC PEPTIDE: B Natriuretic Peptide: 232 pg/mL — ABNORMAL HIGH (ref 0.0–100.0)

## 2016-10-09 LAB — I-STAT TROPONIN, ED: Troponin i, poc: 0.03 ng/mL (ref 0.00–0.08)

## 2016-10-09 MED ORDER — PREDNISONE 50 MG PO TABS
50.0000 mg | ORAL_TABLET | Freq: Once | ORAL | Status: AC
  Administered 2016-10-09: 50 mg via ORAL
  Filled 2016-10-09: qty 1

## 2016-10-09 MED ORDER — FUROSEMIDE 10 MG/ML IJ SOLN
40.0000 mg | Freq: Once | INTRAMUSCULAR | Status: AC
  Administered 2016-10-09: 40 mg via INTRAVENOUS
  Filled 2016-10-09: qty 4

## 2016-10-09 MED ORDER — IPRATROPIUM-ALBUTEROL 0.5-2.5 (3) MG/3ML IN SOLN
3.0000 mL | Freq: Once | RESPIRATORY_TRACT | Status: AC
  Administered 2016-10-09: 3 mL via RESPIRATORY_TRACT
  Filled 2016-10-09: qty 3

## 2016-10-09 MED ORDER — IPRATROPIUM-ALBUTEROL 0.5-2.5 (3) MG/3ML IN SOLN
3.0000 mL | Freq: Once | RESPIRATORY_TRACT | Status: AC
  Administered 2016-10-09: 3 mL via RESPIRATORY_TRACT

## 2016-10-09 MED ORDER — PREDNISONE 10 MG PO TABS: 40.0000 mg | ORAL_TABLET | Freq: Every day | ORAL | 0 refills | Status: DC

## 2016-10-09 MED ORDER — PREDNISONE 5 MG PO TABS: 5.0000 mg | ORAL_TABLET | Freq: Two times a day (BID) | ORAL | 0 refills | Status: AC

## 2016-10-09 NOTE — Progress Notes (Signed)
Dg cxr  

## 2016-10-09 NOTE — Discharge Instructions (Signed)
Please take steroids and breathing treatments as prescribed.  Follow-up closely with your PCP.  Return for worsening symptoms, including difficulty breathing, worsening swelling, passing out, chest pain or any other symptoms concerning to you.

## 2016-10-09 NOTE — ED Provider Notes (Signed)
[facility] DEPT Provider Note   CSN: [id number] Arrival date & time: 10/09/16  1312     History   Chief Complaint Chief Complaint  Patient presents with  . Cough    HPI [name] [name] is a 83 y.o. female.  HPI  83 year old female who presents with cough and wheezing. History of NICM EF 30-35% s/p AICD, complete heart block with pacemaker, COPD on nighttime 2-4L supplemental oxygen, DM, HTN, HLD. States one week of mild cough, with clear sputum. Seen by PCP one week ago, and felt this may likely be due to viral illness. She had some wheezing today and was sent to ED by PCP for evaluation. She denies chest pain, shortness of breath, LE edema, orthopnea or PND. No fever, but mild chills. No body aches, sore throat, runny nose or congestion.   Past Medical History:  Diagnosis Date  . Adenomatous polyp of colon 12/16/2002   Dr. [name]/[facility]  . Allergy   . Cataract   . CHB (complete heart block) ([place]) 10/07/2014      . CHF (congestive heart failure) ([place])       . Chronic kidney disease, stage I 2006  . Constipation       . COPD (chronic obstructive pulmonary disease) ([place])       . DJD (degenerative joint disease) of lumbar spine   . DM (diabetes mellitus) ([place]) 2006  . Esophageal reflux   . Glaucoma   . Gout       . Heart murmur   . Hyperlipemia   . IDA (iron deficiency anemia)    Parenteral iron/[name]  . Insomnia       . Non-ischemic cardiomyopathy ([place])    a. s/p MDT CRTD  . Obesity, unspecified   . Other and unspecified hyperlipidemia   . Oxygen deficiency 2014   nocturnal;  . Spondylolisthesis   . Spondylosis   . Thyroid cancer ([place])    remote thyroidectomy, no recurrence, pt denies in 2016 thyroid cancer  . Unspecified hypothyroidism       . 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 Vertigo         Patient Active Problem List   Diagnosis Date Noted  . Acute bronchitis 10/08/2016    . Nocturnal hypoxia 10/08/2016  . Dependence on nocturnal oxygen therapy 10/08/2016  . Hematuria 03/20/2016  . S/P ICD (internal cardiac defibrillator) procedure 02/08/2015  . Back pain without radiation 10/13/2014  . CHB (complete heart block) ([place]) 10/07/2014  . Fatigue 07/20/2014  . Anemia 06/24/2014  . Osteopenia 02/10/2014  . Encounter for therapeutic drug monitoring 10/29/2013  . Spinal stenosis of lumbar region 02/19/2013  . OA (osteoarthritis) of knee 02/19/2013  . Hemolytic anemia ([place]) 09/24/2012  . H/O adenomatous polyp of colon 05/24/2012  . High risk medication use 05/24/2012  . COPD (chronic obstructive pulmonary disease) ([place]) 05/19/2012  . Chronic systolic heart failure ([place]) 05/19/2012  . Chronic anticoagulation, on coumadin for Mechanical AVR and MVR 04/01/2012  . Cardiorenal syndrome with renal failure 03/22/2012  . S/P AVR (aortic valve replacement) 03/22/2012  . S/P MVR (mitral valve replacement) 03/22/2012  . AICD (automatic cardioverter/defibrillator) present 03/22/2012  . CKD (chronic kidney disease), stage IV ([place]) 03/22/2012  . Warfarin-induced coagulopathy ([place]) 03/21/2012  . Papilloma of breast 03/06/2012  . Iron deficiency 10/04/2011  . Allergic rhinitis 02/14/2011  . Nausea without vomiting 06/07/2010  . HIP PAIN, RIGHT 04/23/2009  . VARICOSE  VEINS LOWER EXTREMITIES W/OTH COMPS 02/09/2009  . Calculus of gallbladder 02/09/2009  . Type 2 diabetes mellitus ([place]) 06/15/2008  . Hypothyroidism, postsurgical 02/24/2008  . Hyperlipidemia LDL goal <70 02/24/2008  . Essential hypertension 02/24/2008  . Non-ischemic cardiomyopathy with ICD 02/24/2008  . GERD 02/24/2008  . DEGENERATIVE JOINT DISEASE, SPINE 02/24/2008    Past Surgical History:  Procedure Laterality Date  . AORTIC AND MITRAL VALVE REPLACEMENT  2001  . BI-VENTRICULAR IMPLANTABLE CARDIOVERTER DEFIBRILLATOR UPGRADE N/A 01/18/2015   Procedure: BI-VENTRICULAR IMPLANTABLE CARDIOVERTER  DEFIBRILLATOR UPGRADE;  Surgeon: Evans Lance, MD;  Location: [facility] CATH LAB;  Service: Cardiovascular;  Laterality: N/A;  . CARDIAC CATHETERIZATION    . CARDIAC VALVE REPLACEMENT    . COLONOSCOPY  06/12/2007   Rourk- Normal rectum/Normal colon  . COLONOSCOPY  12/16/02   small hemorrhoids  . COLONOSCOPY  06/20/2012   Procedure: COLONOSCOPY;  Surgeon: Daneil Dolin, MD;  Location: [facility] END[facility];  Service: Endoscopy;  Laterality: N/A;  10:45  . defibrillator implanted 2006    . DOPPLER ECHOCARDIOGRAPHY  2012  . DOPPLER ECHOCARDIOGRAPHY  05,06,07,08,09,2011  . ESOPHAGOGASTRODUODENOSCOPY  06/12/2007   Rourk- normal esophagus, small hiatal hernia, otherwise normal stomach, D1, D2  . EYE SURGERY Right 2005  . EYE SURGERY Left 2006  . ICD LEAD REMOVAL Left 02/08/2015   Procedure: ICD LEAD REMOVAL;  Surgeon: Evans Lance, MD;  Location: [facility] OR;  Service: Cardiovascular;  Laterality: Left;  "Will plan extraction and insertion of a BiV PM"  **Dr. Roxan Hockey backing up case**  . IMPLANTABLE CARDIOVERTER DEFIBRILLATOR (ICD) GENERATOR CHANGE Left 02/08/2015   Procedure: ICD GENERATOR CHANGE;  Surgeon: Evans Lance, MD;  Location: [facility];  Service: Cardiovascular;  Laterality: Left;  . INSERT / REPLACE / REMOVE PACEMAKER    . PACEMAKER INSERTION  May 2016  . pacemaker placed  2004  . right breast cyst removed     benign   . right cataract removed     2005  . THYROIDECTOMY      OB History    Gravida Para Term Preterm AB Living             0   SAB TAB Ectopic Multiple Live Births                   Home Medications    Prior to Admission medications   Medication Sig Start Date End Date Taking? Authorizing Provider  acetaminophen (TYLENOL) 500 MG tablet Take 500 mg by mouth every 6 (six) hours as needed (pain).   Yes Historical Provider, MD  albuterol (PROVENTIL HFA;VENTOLIN HFA) 108 (90 BASE) MCG/ACT inhaler Inhale 2 puffs into the lungs every 6 (six) hours as needed for wheezing or  shortness of breath. 07/29/14  Yes Fayrene Helper, MD  albuterol (PROVENTIL) (2.5 MG/3ML) 0.083% nebulizer solution INHALE 1 VIAL VIA NEBULIZER THREE TIMES DAILY. 02/24/16  Yes Fayrene Helper, MD  allopurinol (ZYLOPRIM) 100 MG tablet Take 1 tablet (100 mg total) by mouth daily. 04/27/16  Yes Fayrene Helper, MD  azelastine (ASTELIN) 0.1 % nasal spray Place 2 sprays into both nostrils 2 (two) times daily. Use in each nostril as directed 10/04/15  Yes Fayrene Helper, MD  benzonatate (TESSALON) 100 MG capsule Take 1 capsule (100 mg total) by mouth 2 (two) times daily as needed for cough. 10/04/16  Yes Fayrene Helper, MD  calcitRIOL (ROCALTROL) 0.25 MCG capsule Take 1 capsule by mouth 3 (three) times a week. 03/22/16  Yes Historical Provider, MD  cholecalciferol (VITAMIN D) 1000 UNITS tablet Take 1,000 Units by mouth daily.   Yes Historical Provider, MD  COUMADIN 5 MG tablet Take 2 tablets daily Patient taking differently: Take 5-10 mg by mouth daily at 6 PM. Take 5 mg daily except on Wednesday and Saturday take 10 mg. 07/05/16  Yes Mihai Croitoru, MD  digoxin (LANOXIN) 0.125 MG tablet Take 1 tablet (0.125 mg total) by mouth every other day. 12/22/15  Yes Mihai Croitoru, MD  docusate sodium (COLACE) 100 MG capsule Take 300 mg by mouth at bedtime.   Yes Historical Provider, MD  ezetimibe-simvastatin (VYTORIN) 10-20 MG tablet Take 1 tablet by mouth 3  times weekly 05/26/16  Yes Fayrene Helper, MD  fluticasone [facility]) 50 MCG/ACT nasal spray instill 2 sprays into each nostril once daily 05/26/16  Yes Fayrene Helper, MD  fluticasone furoate-vilanterol (BREO ELLIPTA) 100-25 MCG/INH AEPB Inhale 1 puff into the lungs daily.   Yes Historical Provider, MD  folic acid (FOLVITE) 1 MG tablet Take 1 tablet (1 mg total) by mouth daily. 04/04/16  Yes Fayrene Helper, MD  furosemide (LASIX) 40 MG tablet Take 1 tablet by mouth  daily 04/27/16  Yes Fayrene Helper, MD  gabapentin (NEURONTIN) 300 MG  capsule Take 1 capsule (300 mg total) by mouth 2 (two) times daily. Patient taking differently: Take 300 mg by mouth daily.  09/04/16  Yes Fayrene Helper, MD  hydrALAZINE (APRESOLINE) 25 MG tablet Take 1 tablet (25 mg total) by mouth 3 (three) times daily. 05/16/16  Yes Fayrene Helper, MD  HYDROcodone-acetaminophen (NORCO) 7.5-325 MG tablet Take 1 tablet by mouth every 6 (six) hours as needed for moderate pain (Must last 30 days.Do not drive or operate machinery while taking this medicine.). 09/05/16  Yes Sanjuana Kava, MD  isosorbide mononitrate (IMDUR) 60 MG 24 hr tablet Take 1 tablet (60 mg total) by mouth daily. 08/21/16 11/19/16 Yes [place] Sine, MD  losartan (COZAAR) 50 MG tablet Take 1 tablet by mouth  daily 05/16/16  Yes Fayrene Helper, MD  meclizine (ANTIVERT) 12.5 MG tablet take 1 tablet by mouth twice a day if needed for dizziness 08/16/16  Yes Raylene Everts, MD  metolazone (ZAROXOLYN) 2.5 MG tablet Take 1 tablet (2.5 mg total) by mouth once a week. 11/09/15  Yes Fayrene Helper, MD  ondansetron (ZOFRAN) 4 MG tablet Take 1 tablet (4 mg total) by mouth daily as needed for nausea or vomiting. 09/11/16  Yes Fayrene Helper, MD  Polyethyl [name] (SYSTANE OP) Place 1 drop into both eyes daily as needed (dry eyes).   Yes Historical Provider, MD  potassium chloride SA (KLOR-CON M20) 20 MEQ tablet Take 1 tablet by mouth  daily 04/27/16  Yes Fayrene Helper, MD  pravastatin (PRAVACHOL) 20 MG tablet Take 1 tablet (20 mg total) by mouth daily. 07/06/16  Yes Fayrene Helper, MD  promethazine-dextromethorphan (PROMETHAZINE-DM) 6.25-15 MG/5ML syrup One teaspoon at bedtime, as needed, for excessive cough 10/04/16  Yes Fayrene Helper, MD  SYNTHROID 75 MCG tablet Take 1 tablet by mouth  daily 09/04/16  Yes Fayrene Helper, MD  temazepam (RESTORIL) 30 MG capsule Take 30 mg by mouth at bedtime as needed for sleep.   Yes Historical Provider, MD  TOPROL XL 100 MG 24  hr tablet Take 1 tablet (100 mg total) by mouth daily. Take with or immediately following a meal. 05/16/16  Yes Fayrene Helper, MD  azithromycin (ZITHROMAX) 250 MG tablet Two tablets on day one, then one tablet once daily for an additional 4 days Patient not taking: Reported on 10/09/2016 10/04/16   Fayrene Helper, MD  predniSONE (DELTASONE) 10 MG tablet Take 4 tablets (40 mg total) by mouth daily. 10/10/16   Forde Dandy, MD  predniSONE (DELTASONE) 5 MG tablet Take 1 tablet (5 mg total) by mouth 2 (two) times daily. Patient not taking: Reported on 10/09/2016 10/09/16 10/13/16  Fayrene Helper, MD    Family History Family History  Problem Relation Age of Onset  . Cancer Mother     behind pancres  . Heart disease Father   . Heart disease Sister     Heart attack  . Heart disease Brother   . Diabetes Brother   . Heart disease Brother   . Diabetes Brother   . Hypertension Brother   . Cancer Brother     lung    Social History Social History  Substance Use Topics  . Smoking status: Former Smoker    Packs/day: 0.75    Years: 40.00    Types: Cigarettes    Quit date: 03/08/1987  . Smokeless tobacco: Never Used  . Alcohol use No     Allergies   Aspirin; Fentanyl; Niacin; and Penicillins   Review of Systems Review of Systems 10/14 systems reviewed and are negative other than those stated in the HPI   Physical Exam Updated Vital Signs BP (!) 125/54 (BP Location: Left Arm)   Pulse 70   Temp 97.5 F (36.4 C) (Oral)   Resp 22   Ht 5\' 7"  (1.702 m)   Wt 219 lb (99.3 kg)   SpO2 95%   BMI 34.30 kg/m   Physical Exam Physical Exam  Nursing note and vitals reviewed. Constitutional: Well developed, well nourished, non-toxic, and in no acute distress Head: Normocephalic and atraumatic.  Mouth/Throat: Oropharynx is clear and moist.  Neck: Normal range of motion. Neck supple.  Cardiovascular: Normal rate and regular rhythm.  Trace LE lower extremity edema.    Pulmonary/Chest: Effort normal. No conversational dyspnea.  scattered expiratory wheezing on lung exam. Abdominal: Soft. There is no tenderness. There is no rebound and no guarding.  Musculoskeletal: Normal range of motion.  Neurological: Alert, no facial droop, fluent speech, moves all extremities symmetrically Skin: Skin is warm and dry.  Psychiatric: Cooperative   ED Treatments / Results  Labs (all labs ordered are listed, but only abnormal results are displayed) Labs Reviewed  CBC WITH DIFFERENTIAL/PLATELET - Abnormal; Notable for the following:       Result Value   RBC 3.75 (*)    Hemoglobin 11.4 (*)    HCT 35.1 (*)    RDW 16.4 (*)    All other components within normal limits  BASIC METABOLIC PANEL - Abnormal; Notable for the following:    Sodium 134 (*)    Chloride 99 (*)    Glucose, Bld 100 (*)    BUN 53 (*)    Creatinine, Ser 1.96 (*)    GFR calc non Af Amer 23 (*)    GFR calc Af Amer 26 (*)    All other components within normal limits  BRAIN NATRIURETIC PEPTIDE - Abnormal; Notable for the following:    B Natriuretic Peptide 232.0 (*)    All other components within normal limits  I-STAT TROPOININ, ED    EKG  EKG Interpretation None       Radiology Dg Chest 2 View  Result Date: 10/09/2016 CLINICAL DATA:  Cough and wheezing 1 week. EXAM: CHEST  2 VIEW COMPARISON:  03/20/2016 FINDINGS: Sternotomy wire and left-sided pacemaker unchanged. Lungs are adequately inflated without focal consolidation or effusion. There is minimal prominence of the perihilar markings unchanged likely mild vascular congestion. Mild stable cardiomegaly. Calcified plaque over the thoracic aorta. Mild stable wedge compression deformity over the mid thoracic spine. IMPRESSION: Stable cardiomegaly with suggestion of mild vascular congestion. Aortic atherosclerosis. Stable mild wedge compression deformity mid thoracic spine. Electronically Signed   By: Marin Olp M.D.   On: 10/09/2016 14:08     Procedures Procedures (including critical care time)  Medications Ordered in ED Medications  ipratropium-albuterol (DUONEB) 0.5-2.5 (3) MG/3ML nebulizer solution 3 mL (3 mLs Nebulization Given 10/09/16 1510)  furosemide (LASIX) injection 40 mg (40 mg Intravenous Given 10/09/16 1605)  ipratropium-albuterol (DUONEB) 0.5-2.5 (3) MG/3ML nebulizer solution 3 mL (3 mLs Nebulization Given 10/09/16 1601)  predniSONE (DELTASONE) tablet 50 mg (50 mg Oral Given 10/09/16 1605)     Initial Impression / Assessment and Plan / ED Course  I have reviewed the triage vital signs and the nursing notes.  Pertinent labs & imaging results that were available during my care of the patient were reviewed by me and considered in my medical decision making (see chart for details).  Clinical Course     With history of CHF and COPD who presents with cough and clear sputum. She is well appearing. Normal oxygenation on room air and with normal work of breathing. No dyspnea or chest pain. CXR visualized. With no infiltrate or pneumonia. With mild vascular congestion but no significant pulmonary edema.  Question potential early CHF, but no other symptoms of fluid overload. Did receive single dose 40 IV lasix in ED. Possible mild COPD exacerbation in setting of viral illness. Given breathing treatments.  Given well appearing, and feels improved. Will discharged with COPD treatment. She has close PCP follow-up in one week. Will also call her cardiologist.   Final Clinical Impressions(s) / ED Diagnoses   Final diagnoses:  Cough  Viral URI with cough  COPD exacerbation ([place])    New Prescriptions Discharge Medication List as of 10/09/2016  4:11 PM    START taking these medications   Details  !! predniSONE (DELTASONE) 10 MG tablet Take 4 tablets (40 mg total) by mouth daily., Starting Tue 10/10/2016, Print     !! - Potential duplicate medications found. Please discuss with provider.       Forde Dandy,  MD 10/09/16 2006

## 2016-10-09 NOTE — Telephone Encounter (Signed)
pls advise her that I recommend and have ordered a cXR and sent in 5 days of prednisone to help with wheezing. If she is significantly short of breath and wheezing , may benefit and need eD evaluation for l possible admoission, again based on her co morbidities , this is my primary recommendatio, ED eval, if unwilling then alternate course of prednisone ,cXr n may be pursued

## 2016-10-09 NOTE — Telephone Encounter (Signed)
Patient aware and will go to the ED for eval

## 2016-10-09 NOTE — ED Notes (Signed)
ED Provider at bedside. 

## 2016-10-09 NOTE — ED Notes (Signed)
Pt ambulated to bathroom on RA. O2 at 95 %

## 2016-10-09 NOTE — ED Triage Notes (Signed)
Patient complaining of cough x 1 week. States she is coughing up white sputum.

## 2016-10-13 ENCOUNTER — Other Ambulatory Visit: Payer: Self-pay

## 2016-10-13 NOTE — Patient Outreach (Signed)
[facility]) Care Management  [place]  10/13/2016   Chizuko Trine [birth year] [id number]  Subjective: Telephone call to patient for monthly call.  Patient reports she is getting over the flu. She states she saw her primary doctor and was given medication and then ended up at the emergency room as well. Patient reports that she feels much better and that she takes her last dose of prednisone today. Patient also reports she sees her primary doctor for follow up on 10-19-16.  Discussed with patient her inhaler that will no longer be covered. She states that she has talked with her doctor about it and will mention it again on her visit next week as they were awaiting for another medication that would be covered from the insurance company. Discussed with patient worsening symptoms of respirator infection and when to seek medical attention. She verbalized understanding.  Also discussed with patient heart failure and continuing to monitor.  She verbalized understanding.    Objective:   Encounter Medications:  Outpatient Encounter Prescriptions as of 10/13/2016  Medication Sig  . acetaminophen (TYLENOL) 500 MG tablet Take 500 mg by mouth every 6 (six) hours as needed (pain).  Marland Kitchen albuterol (PROVENTIL HFA;VENTOLIN HFA) 108 (90 BASE) MCG/ACT inhaler Inhale 2 puffs into the lungs every 6 (six) hours as needed for wheezing or shortness of breath.  Marland Kitchen albuterol (PROVENTIL) (2.5 MG/3ML) 0.083% nebulizer solution INHALE 1 VIAL VIA NEBULIZER THREE TIMES DAILY.  Marland Kitchen allopurinol (ZYLOPRIM) 100 MG tablet Take 1 tablet (100 mg total) by mouth daily.  Marland Kitchen azelastine (ASTELIN) 0.1 % nasal spray Place 2 sprays into both nostrils 2 (two) times daily. Use in each nostril as directed  . benzonatate (TESSALON) 100 MG capsule Take 1 capsule (100 mg total) by mouth 2 (two) times daily as needed for cough.  . calcitRIOL (ROCALTROL) 0.25 MCG capsule Take 1 capsule by mouth 3 (three) times a week.  .  cholecalciferol (VITAMIN D) 1000 UNITS tablet Take 1,000 Units by mouth daily.  Marland Kitchen COUMADIN 5 MG tablet Take 2 tablets daily (Patient taking differently: Take 5-10 mg by mouth daily at 6 PM. Take 5 mg daily except on Wednesday and Saturday take 10 mg.)  . digoxin (LANOXIN) 0.125 MG tablet Take 1 tablet (0.125 mg total) by mouth every other day.  . docusate sodium (COLACE) 100 MG capsule Take 300 mg by mouth at bedtime.  Marland Kitchen ezetimibe-simvastatin (VYTORIN) 10-20 MG tablet Take 1 tablet by mouth 3  times weekly  . fluticasone (FLONASE) 50 MCG/ACT nasal spray instill 2 sprays into each nostril once daily  . fluticasone furoate-vilanterol (BREO ELLIPTA) 100-25 MCG/INH AEPB Inhale 1 puff into the lungs daily.  . folic acid (FOLVITE) 1 MG tablet Take 1 tablet (1 mg total) by mouth daily.  . furosemide (LASIX) 40 MG tablet Take 1 tablet by mouth  daily  . gabapentin (NEURONTIN) 300 MG capsule Take 1 capsule (300 mg total) by mouth 2 (two) times daily. (Patient taking differently: Take 300 mg by mouth daily. )  . hydrALAZINE (APRESOLINE) 25 MG tablet Take 1 tablet (25 mg total) by mouth 3 (three) times daily.  Marland Kitchen HYDROcodone-acetaminophen (NORCO) 7.5-325 MG tablet Take 1 tablet by mouth every 6 (six) hours as needed for moderate pain (Must last 30 days.Do not drive or operate machinery while taking this medicine.).  Marland Kitchen isosorbide mononitrate (IMDUR) 60 MG 24 hr tablet Take 1 tablet (60 mg total) by mouth daily.  Marland Kitchen losartan (COZAAR) 50 MG tablet Take  1 tablet by mouth  daily  . meclizine (ANTIVERT) 12.5 MG tablet take 1 tablet by mouth twice a day if needed for dizziness  . metolazone (ZAROXOLYN) 2.5 MG tablet Take 1 tablet (2.5 mg total) by mouth once a week.  . ondansetron (ZOFRAN) 4 MG tablet Take 1 tablet (4 mg total) by mouth daily as needed for nausea or vomiting.  [name] [name] (SYSTANE OP) Place 1 drop into both eyes daily as needed (dry eyes).  . potassium chloride SA (KLOR-CON M20)  20 MEQ tablet Take 1 tablet by mouth  daily  . pravastatin (PRAVACHOL) 20 MG tablet Take 1 tablet (20 mg total) by mouth daily.  . predniSONE (DELTASONE) 10 MG tablet Take 4 tablets (40 mg total) by mouth daily.  . promethazine-dextromethorphan (PROMETHAZINE-DM) 6.25-15 MG/5ML syrup One teaspoon at bedtime, as needed, for excessive cough  . SYNTHROID 75 MCG tablet Take 1 tablet by mouth  daily  . temazepam (RESTORIL) 30 MG capsule Take 30 mg by mouth at bedtime as needed for sleep.  . TOPROL XL 100 MG 24 hr tablet Take 1 tablet (100 mg total) by mouth daily. Take with or immediately following a meal.  . azithromycin (ZITHROMAX) 250 MG tablet Two tablets on day one, then one tablet once daily for an additional 4 days (Patient not taking: Reported on 10/09/2016)  . predniSONE (DELTASONE) 5 MG tablet Take 1 tablet (5 mg total) by mouth 2 (two) times daily. (Patient not taking: Reported on 10/09/2016)   Facility-Administered Encounter Medications as of 10/13/2016  Medication  . epoetin alfa (EPOGEN,PROCRIT) injection 40,000 Units    Functional Status:  In your present state of health, do you have any difficulty performing the following activities: 08/08/2016 04/24/2016  Hearing? N N  Vision? N N  Difficulty concentrating or making decisions? N Y  Walking or climbing stairs? N Y  Dressing or bathing? N Y  Doing errands, shopping? Tempie Donning  Preparing Food and eating ? N N  Using the Toilet? N N  In the past six months, have you accidently leaked urine? N N  Do you have problems with loss of bowel control? N N  Managing your Medications? N N  Managing your Finances? N N  Housekeeping or managing your Housekeeping? N N  Some recent data might be hidden    Fall/Depression Screening: PHQ 2/9 Scores 10/13/2016 09/19/2016 08/08/2016 07/05/2016 04/24/2016 04/24/2016 04/17/2016  PHQ - 2 Score 0 0 0 0 2 2 1   PHQ- 9 Score - - - - 9 6 -    Assessment: Patient continues to benefit from health coach outreach  for disease management and support.    Plan:  [facility] CM Care Plan Problem One   Flowsheet Row Most Recent Value  Care Plan Problem One  Heart Failure knowledge deficit  Role Documenting the Problem One  Health Coach  Care Plan for Problem One  Active  [facility] Long Term Goal (31-90 days)  Patient will become familiar with heart failure disease process and be able to verbalize heart failure zones for self management wtihin 90 days.    [facility] Long Term Goal Start Date  10/13/16  Interventions for Problem One Long Term Goal  RN Health Coach reviewed heart failure zones and when to notify physician.       RN Health Coach will contact patient in the month of February and patient agrees to next outreach.  Jone Baseman, RN, MSN [place]  [phone]    

## 2016-10-16 ENCOUNTER — Other Ambulatory Visit: Payer: Self-pay | Admitting: Family Medicine

## 2016-10-16 ENCOUNTER — Ambulatory Visit (INDEPENDENT_AMBULATORY_CARE_PROVIDER_SITE_OTHER): Payer: Medicare Other | Admitting: *Deleted

## 2016-10-16 DIAGNOSIS — Z5181 Encounter for therapeutic drug level monitoring: Secondary | ICD-10-CM | POA: Diagnosis not present

## 2016-10-16 DIAGNOSIS — N183 Chronic kidney disease, stage 3 (moderate): Secondary | ICD-10-CM | POA: Diagnosis not present

## 2016-10-16 DIAGNOSIS — D509 Iron deficiency anemia, unspecified: Secondary | ICD-10-CM | POA: Diagnosis not present

## 2016-10-16 DIAGNOSIS — Z952 Presence of prosthetic heart valve: Secondary | ICD-10-CM | POA: Diagnosis not present

## 2016-10-16 DIAGNOSIS — R809 Proteinuria, unspecified: Secondary | ICD-10-CM | POA: Diagnosis not present

## 2016-10-16 DIAGNOSIS — I255 Ischemic cardiomyopathy: Secondary | ICD-10-CM

## 2016-10-16 DIAGNOSIS — E1122 Type 2 diabetes mellitus with diabetic chronic kidney disease: Secondary | ICD-10-CM | POA: Diagnosis not present

## 2016-10-16 DIAGNOSIS — E559 Vitamin D deficiency, unspecified: Secondary | ICD-10-CM | POA: Diagnosis not present

## 2016-10-16 DIAGNOSIS — E785 Hyperlipidemia, unspecified: Secondary | ICD-10-CM | POA: Diagnosis not present

## 2016-10-16 DIAGNOSIS — I10 Essential (primary) hypertension: Secondary | ICD-10-CM | POA: Diagnosis not present

## 2016-10-16 DIAGNOSIS — Z79899 Other long term (current) drug therapy: Secondary | ICD-10-CM | POA: Diagnosis not present

## 2016-10-16 DIAGNOSIS — E890 Postprocedural hypothyroidism: Secondary | ICD-10-CM | POA: Diagnosis not present

## 2016-10-16 LAB — POCT INR: INR: 3.2

## 2016-10-17 LAB — COMPREHENSIVE METABOLIC PANEL
ALT: 22 IU/L (ref 0–32)
AST: 25 IU/L (ref 0–40)
Albumin/Globulin Ratio: 1.2 (ref 1.2–2.2)
Albumin: 3.9 g/dL (ref 3.5–4.7)
Alkaline Phosphatase: 59 IU/L (ref 39–117)
BUN/Creatinine Ratio: 39 — ABNORMAL HIGH (ref 12–28)
BUN: 74 mg/dL — ABNORMAL HIGH (ref 8–27)
Bilirubin Total: 0.3 mg/dL (ref 0.0–1.2)
CO2: 25 mmol/L (ref 18–29)
Calcium: 9.3 mg/dL (ref 8.7–10.3)
Chloride: 99 mmol/L (ref 96–106)
Creatinine, Ser: 1.89 mg/dL — ABNORMAL HIGH (ref 0.57–1.00)
GFR calc Af Amer: 28 mL/min/{1.73_m2} — ABNORMAL LOW (ref >59)
GFR calc non Af Amer: 24 mL/min/{1.73_m2} — ABNORMAL LOW (ref >59)
Globulin, Total: 3.2 g/dL (ref 1.5–4.5)
Glucose: 112 mg/dL — ABNORMAL HIGH (ref 65–99)
Potassium: 4.6 mmol/L (ref 3.5–5.2)
Sodium: 141 mmol/L (ref 134–144)
Total Protein: 7.1 g/dL (ref 6.0–8.5)

## 2016-10-17 LAB — LIPID PANEL W/O CHOL/HDL RATIO
Cholesterol, Total: 165 mg/dL (ref 100–199)
HDL: 31 mg/dL — ABNORMAL LOW (ref >39)
LDL Calculated: 81 mg/dL (ref 0–99)
Triglycerides: 265 mg/dL — ABNORMAL HIGH (ref 0–149)
VLDL Cholesterol Cal: 53 mg/dL — ABNORMAL HIGH (ref 5–40)

## 2016-10-17 LAB — VITAMIN D 25 HYDROXY (VIT D DEFICIENCY, FRACTURES): Vit D, 25-Hydroxy: 33.4 ng/mL (ref 30.0–100.0)

## 2016-10-17 LAB — TSH: TSH: 1.25 u[IU]/mL (ref 0.450–4.500)

## 2016-10-19 ENCOUNTER — Ambulatory Visit (INDEPENDENT_AMBULATORY_CARE_PROVIDER_SITE_OTHER): Payer: Medicare Other | Admitting: Family Medicine

## 2016-10-19 ENCOUNTER — Encounter: Payer: Self-pay | Admitting: Family Medicine

## 2016-10-19 VITALS — BP 100/52 | HR 78 | Resp 16 | Ht 67.0 in | Wt 215.0 lb

## 2016-10-19 DIAGNOSIS — E785 Hyperlipidemia, unspecified: Secondary | ICD-10-CM | POA: Diagnosis not present

## 2016-10-19 DIAGNOSIS — M8580 Other specified disorders of bone density and structure, unspecified site: Secondary | ICD-10-CM

## 2016-10-19 DIAGNOSIS — E1122 Type 2 diabetes mellitus with diabetic chronic kidney disease: Secondary | ICD-10-CM

## 2016-10-19 DIAGNOSIS — I10 Essential (primary) hypertension: Secondary | ICD-10-CM | POA: Diagnosis not present

## 2016-10-19 DIAGNOSIS — E890 Postprocedural hypothyroidism: Secondary | ICD-10-CM

## 2016-10-19 DIAGNOSIS — G4734 Idiopathic sleep related nonobstructive alveolar hypoventilation: Secondary | ICD-10-CM

## 2016-10-19 DIAGNOSIS — I255 Ischemic cardiomyopathy: Secondary | ICD-10-CM

## 2016-10-19 DIAGNOSIS — E559 Vitamin D deficiency, unspecified: Secondary | ICD-10-CM

## 2016-10-19 DIAGNOSIS — I5022 Chronic systolic (congestive) heart failure: Secondary | ICD-10-CM

## 2016-10-19 DIAGNOSIS — J410 Simple chronic bronchitis: Secondary | ICD-10-CM

## 2016-10-19 MED ORDER — METOLAZONE 2.5 MG PO TABS: 2.5000 mg | ORAL_TABLET | ORAL | 2 refills | Status: DC

## 2016-10-19 MED ORDER — ALBUTEROL SULFATE HFA 108 (90 BASE) MCG/ACT IN AERS: 2.0000 | INHALATION_SPRAY | Freq: Four times a day (QID) | RESPIRATORY_TRACT | 3 refills | Status: DC | PRN

## 2016-10-19 MED ORDER — TOPROL XL 100 MG PO TB24: 100.0000 mg | ORAL_TABLET | Freq: Every day | ORAL | 1 refills | Status: DC

## 2016-10-19 MED ORDER — BENZONATATE 100 MG PO CAPS: 100.0000 mg | ORAL_CAPSULE | Freq: Two times a day (BID) | ORAL | 0 refills | Status: DC | PRN

## 2016-10-19 NOTE — Patient Instructions (Addendum)
F/u in 4.5 month, call if you need me sooner  Please cut back on donuts and cookies  Fasting lipid, cmp , HBA1C, TSH and vit D in 4.5 months, try to get labs when you are getting at the hospital.if able  You are referred to cardiology for 6 month follow up   Tessalon perle sent for chest congestion  Be careful not to fall

## 2016-10-20 NOTE — Assessment & Plan Note (Addendum)
Currently stable, 6 month cardiology f/u past due , will refer

## 2016-10-20 NOTE — Assessment & Plan Note (Signed)
Chronic congestion persists, symptomatic relief with decongestant perles, will prescribe for as needed use. Continue follow up  With pulmonary, no change in chronic med management

## 2016-10-20 NOTE — Progress Notes (Signed)
   [name] [name]     MRN: [id number]      DOB: 26-Dec-1931   HPI [name] [name] is here for follow up and re-evaluation of chronic medical conditions, medication management and review of any available recent lab and radiology data.  Preventive health is updated, specifically  Cancer screening and Immunization.   Questions or concerns regarding consultations or procedures which the PT has had in the interim are  addressed. The PT denies any adverse reactions to current medications since the last visit.  Still c/o excess cough esp at night, denies fever , chills or sputum, just "rattling in chest" Denies sinus pressure or nasal drainage ROS Denies recent fever or chills. . Denies chest pains, palpitations and leg swelling Denies abdominal pain, nausea, vomiting,diarrhea or constipation.   Denies dysuria, frequency, hesitancy or incontinence. Denies uncontrolled  joint pain, swelling and limitation in mobility. Denies headaches, seizures, numbness, or tingling. Denies depression, anxiety or insomnia. Denies skin break down or rash.   PE  BP (!) 100/52   Pulse 78   Resp 16   Ht 5\' 7"  (1.702 m)   Wt 215 lb (97.5 kg)   SpO2 94%   BMI 33.67 kg/m   Patient alert and oriented and in no cardiopulmonary distress.  HEENT: No facial asymmetry, EOMI,   oropharynx pink and moist.  Neck supple no JVD, no mass.  Chest: Adequate air entry bilaterally,  No wheezes , few basilar crackles  CVS: S1, S2 systolic  Murmur and click, no S3.Regular rate.  ABD: Soft non tender.   Ext: No edema  MS: decreased  ROM spine, shoulders, hips and knees.  Skin: Intact, no ulcerations or rash noted.  Psych: Good eye contact, normal affect. Memory intact not anxious or depressed appearing.  CNS: CN 2-12 intact, power,  normal throughout.no focal deficits noted.   Assessment & Plan  COPD (chronic obstructive pulmonary disease) Chronic congestion persists, symptomatic relief with decongestant perles,  will prescribe for as needed use. Continue follow up  With pulmonary, no change in chronic med management  Nocturnal hypoxia Ongoing need for nocturnal oxygen, which will be lifelong  Hypothyroidism, postsurgical Controlled, no change in medication   Hyperlipidemia LDL goal <70 Hyperlipidemia:Low fat diet discussed and encouraged.   Lipid Panel  Lab Results  Component Value Date   CHOL 165 10/16/2016   HDL 31 (L) 10/16/2016   LDLCALC 81 10/16/2016   TRIG 265 (H) 10/16/2016   CHOLHDL 5.1 10/14/2015   Needs to cut back on high fat food, donuts and cookies    Chronic systolic heart failure Currently stable, cardiology f/u past due , will refer

## 2016-10-20 NOTE — Assessment & Plan Note (Signed)
Controlled, no change in medication  

## 2016-10-20 NOTE — Assessment & Plan Note (Signed)
Hyperlipidemia:Low fat diet discussed and encouraged.   Lipid Panel  Lab Results  Component Value Date   CHOL 165 10/16/2016   HDL 31 (L) 10/16/2016   LDLCALC 81 10/16/2016   TRIG 265 (H) 10/16/2016   CHOLHDL 5.1 10/14/2015   Needs to cut back on high fat food, donuts and cookies

## 2016-10-20 NOTE — Assessment & Plan Note (Signed)
Ongoing need for nocturnal oxygen, which will be lifelong

## 2016-10-23 ENCOUNTER — Other Ambulatory Visit: Payer: Self-pay | Admitting: Licensed Clinical Social Worker

## 2016-10-23 ENCOUNTER — Telehealth: Payer: Self-pay | Admitting: *Deleted

## 2016-10-23 MED ORDER — COUMADIN 5 MG PO TABS: ORAL_TABLET | ORAL | 1 refills | Status: DC

## 2016-10-23 NOTE — Telephone Encounter (Signed)
Please call regarding RX for Coumadin   / tg

## 2016-10-23 NOTE — Patient Outreach (Signed)
Assessment:  CSW spoke via phone with client. CSW verified client identity. CSW received verbal permission from client on 10/23/16 for CSW to speak with client about current client needs and status. Client sees Dr. [name] as primary care doctor.  Client said she had appointment with Dr. [name] on 10/19/16.  Client said she had her prescribed medications and is taking medications as prescribed. Client does live alone. She uses a cane or walker to help her with ambulation. Client said she had some family support in transporting her to and from her scheduled medical appointments.  CSW and client spoke of client care plan. CSW encouraged clnet to communicate with CSW in next 30 days to discuss community resources for transport assistance for client. Client is receiving support from [place], Jon Billings, RN. Client said she is taking prescribed pain medication as scheduled. CSW has spoken previously with client about [employer] transport agency and client's contacting that agency for transport information. CSW has talked previously with client about [place] and transport assistance for client through that agency. CSW encouraged client to remember these transport agencies in case client needed transport assistance if family could not transport client to appointments scheduled for client. Client said she is eating adequately.  She is sleeping adequately. Client said she has support from her niece who lives nearby.  Client and CSW spoke of [facility] program support services in social work, pharmacy and nursing. CSW encouraged client to call CSW at [phone] as needed to discuss social work needs of client. Client was appreciative of phone call from [place] on 10/23/16.    Plan:  Client to communicate with CSW in next 30 days to discuss community resources for transport assistance for client.  CSW to call client in 4 weeks to assess client needs at that time.  [name].Anyeli Hockenbury MSW, LCSW Licensed Clinical Social Worker [facility] [phone]

## 2016-10-23 NOTE — Telephone Encounter (Signed)
Pt's mail order has not arrived,requests a few coumadin from local pharmacy,e-scribed to [facility]

## 2016-10-24 ENCOUNTER — Other Ambulatory Visit: Payer: Self-pay | Admitting: Family Medicine

## 2016-10-24 ENCOUNTER — Other Ambulatory Visit: Payer: Self-pay

## 2016-10-24 MED ORDER — FLUTICASONE PROPIONATE 50 MCG/ACT NA SUSP: NASAL | 1 refills | Status: DC

## 2016-10-25 DIAGNOSIS — I509 Heart failure, unspecified: Secondary | ICD-10-CM | POA: Diagnosis not present

## 2016-10-25 DIAGNOSIS — N2581 Secondary hyperparathyroidism of renal origin: Secondary | ICD-10-CM | POA: Diagnosis not present

## 2016-10-25 DIAGNOSIS — N183 Chronic kidney disease, stage 3 (moderate): Secondary | ICD-10-CM | POA: Diagnosis not present

## 2016-10-25 DIAGNOSIS — I10 Essential (primary) hypertension: Secondary | ICD-10-CM | POA: Diagnosis not present

## 2016-10-30 ENCOUNTER — Telehealth: Payer: Self-pay | Admitting: Family Medicine

## 2016-10-30 ENCOUNTER — Other Ambulatory Visit: Payer: Self-pay | Admitting: Family Medicine

## 2016-10-30 NOTE — Telephone Encounter (Signed)
Please advise 

## 2016-10-30 NOTE — Telephone Encounter (Signed)
[name] [name] is c/o a lot of weakness, she states that the flu has left her weak and she is asking if Dr. [name] could send her in something to help her,please advise?

## 2016-10-30 NOTE — Telephone Encounter (Signed)
Lab repoprt does not tell me that any medication  Is needed, time helps people get stronger and feel better. Encourage her to eat healthily and regularly, and get sufficient rest please

## 2016-10-31 ENCOUNTER — Encounter (HOSPITAL_COMMUNITY): Payer: Self-pay | Admitting: Emergency Medicine

## 2016-10-31 ENCOUNTER — Other Ambulatory Visit: Payer: Self-pay

## 2016-10-31 ENCOUNTER — Emergency Department (HOSPITAL_COMMUNITY)
Admission: EM | Admit: 2016-10-31 | Discharge: 2016-10-31 | Disposition: A | Discharge status: Home / Self Care | Payer: Medicare Other | Attending: Emergency Medicine | Admitting: Emergency Medicine

## 2016-10-31 ENCOUNTER — Ambulatory Visit: Payer: Medicare Other | Admitting: Cardiovascular Disease

## 2016-10-31 DIAGNOSIS — Z87891 Personal history of nicotine dependence: Secondary | ICD-10-CM | POA: Insufficient documentation

## 2016-10-31 DIAGNOSIS — E1122 Type 2 diabetes mellitus with diabetic chronic kidney disease: Secondary | ICD-10-CM | POA: Diagnosis not present

## 2016-10-31 DIAGNOSIS — N390 Urinary tract infection, site not specified: Secondary | ICD-10-CM | POA: Diagnosis not present

## 2016-10-31 DIAGNOSIS — J029 Acute pharyngitis, unspecified: Secondary | ICD-10-CM | POA: Diagnosis not present

## 2016-10-31 DIAGNOSIS — Z79899 Other long term (current) drug therapy: Secondary | ICD-10-CM | POA: Diagnosis not present

## 2016-10-31 DIAGNOSIS — E039 Hypothyroidism, unspecified: Secondary | ICD-10-CM | POA: Diagnosis not present

## 2016-10-31 DIAGNOSIS — I5022 Chronic systolic (congestive) heart failure: Secondary | ICD-10-CM | POA: Diagnosis not present

## 2016-10-31 DIAGNOSIS — Z85850 Personal history of malignant neoplasm of thyroid: Secondary | ICD-10-CM | POA: Diagnosis not present

## 2016-10-31 DIAGNOSIS — N184 Chronic kidney disease, stage 4 (severe): Secondary | ICD-10-CM | POA: Diagnosis not present

## 2016-10-31 DIAGNOSIS — E860 Dehydration: Secondary | ICD-10-CM | POA: Diagnosis not present

## 2016-10-31 DIAGNOSIS — Z952 Presence of prosthetic heart valve: Secondary | ICD-10-CM | POA: Insufficient documentation

## 2016-10-31 DIAGNOSIS — J449 Chronic obstructive pulmonary disease, unspecified: Secondary | ICD-10-CM | POA: Diagnosis not present

## 2016-10-31 DIAGNOSIS — I130 Hypertensive heart and chronic kidney disease with heart failure and stage 1 through stage 4 chronic kidney disease, or unspecified chronic kidney disease: Secondary | ICD-10-CM | POA: Diagnosis not present

## 2016-10-31 DIAGNOSIS — R531 Weakness: Secondary | ICD-10-CM | POA: Diagnosis not present

## 2016-10-31 DIAGNOSIS — Z95810 Presence of automatic (implantable) cardiac defibrillator: Secondary | ICD-10-CM | POA: Diagnosis not present

## 2016-10-31 DIAGNOSIS — J069 Acute upper respiratory infection, unspecified: Secondary | ICD-10-CM | POA: Diagnosis not present

## 2016-10-31 LAB — URINALYSIS, ROUTINE W REFLEX MICROSCOPIC
Bilirubin Urine: NEGATIVE
Glucose, UA: NEGATIVE mg/dL
Hgb urine dipstick: NEGATIVE
Ketones, ur: NEGATIVE mg/dL
Nitrite: NEGATIVE
Protein, ur: 30 mg/dL — AB
Specific Gravity, Urine: 1.012 (ref 1.005–1.030)
pH: 7 (ref 5.0–8.0)

## 2016-10-31 LAB — CBC
HCT: 33.3 % — ABNORMAL LOW (ref 36.0–46.0)
Hemoglobin: 10.8 g/dL — ABNORMAL LOW (ref 12.0–15.0)
MCH: 30.3 pg (ref 26.0–34.0)
MCHC: 32.4 g/dL (ref 30.0–36.0)
MCV: 93.5 fL (ref 78.0–100.0)
Platelets: 133 10*3/uL — ABNORMAL LOW (ref 150–400)
RBC: 3.56 MIL/uL — ABNORMAL LOW (ref 3.87–5.11)
RDW: 15.9 % — ABNORMAL HIGH (ref 11.5–15.5)
WBC: 5 10*3/uL (ref 4.0–10.5)

## 2016-10-31 LAB — BASIC METABOLIC PANEL
Anion gap: 8 (ref 5–15)
BUN: 34 mg/dL — ABNORMAL HIGH (ref 6–20)
CO2: 29 mmol/L (ref 22–32)
Calcium: 9.6 mg/dL (ref 8.9–10.3)
Chloride: 101 mmol/L (ref 101–111)
Creatinine, Ser: 1.61 mg/dL — ABNORMAL HIGH (ref 0.44–1.00)
GFR calc Af Amer: 33 mL/min — ABNORMAL LOW (ref >60)
GFR calc non Af Amer: 29 mL/min — ABNORMAL LOW (ref >60)
Glucose, Bld: 115 mg/dL — ABNORMAL HIGH (ref 65–99)
Potassium: 4.3 mmol/L (ref 3.5–5.1)
Sodium: 138 mmol/L (ref 135–145)

## 2016-10-31 LAB — TROPONIN I
Troponin I: 0.03 ng/mL (ref <0.03)
Troponin I: 0.03 ng/mL (ref <0.03)

## 2016-10-31 MED ORDER — CEPHALEXIN 500 MG PO CAPS
500.0000 mg | ORAL_CAPSULE | Freq: Once | ORAL | Status: AC
  Administered 2016-10-31: 500 mg via ORAL
  Filled 2016-10-31: qty 1

## 2016-10-31 MED ORDER — CEPHALEXIN 500 MG PO CAPS: 500.0000 mg | ORAL_CAPSULE | Freq: Four times a day (QID) | ORAL | 0 refills | Status: DC

## 2016-10-31 NOTE — ED Notes (Signed)
Pt very upset at this time regarding how long she has been here.  States she went to the bathroom and nobody came back to hook her up and she has not seen the doctor.  Went and discussed with Dr. Lacinda Axon who states he was in the room discussing plan with them about 15 minutes ago.  Medicated as ordered.  Monitor connected. Lab called to draw blood.

## 2016-10-31 NOTE — ED Triage Notes (Signed)
Patient complains of weakness that started 3 days ago. States that she had the flu last week. Patient was sent here by [place] [facility] due to dehydration and to rule out CHF.

## 2016-10-31 NOTE — Discharge Instructions (Signed)
You have evidence of a urinary tract infection. Increase fluids. Antibiotic. There was no change in your heart test

## 2016-10-31 NOTE — Telephone Encounter (Signed)
Called and left message for patient.  Left advice on voicemail.

## 2016-10-31 NOTE — ED Notes (Signed)
CRITICAL VALUE ALERT  Critical value received:  Troponin - 0.03  Date of notification:  10/31/2016  Time of notification:  1650  Critical value read back: yes  Nurse who received alert:  LJS  MD notified (1st page):  Dr Lacinda Axon  Time of first page:  1651  MD notified (2nd page):  Time of second page:  Responding MD:  Dr Lacinda Axon  Time MD responded:  [phone]

## 2016-10-31 NOTE — ED Notes (Signed)
Went in to patient's room to obtain IV. Patient states "why do they want an IV? The doctor said he didn't think I was dehydrated. I'd rather you wait and see if it's necessary." IV not obtained at this time.

## 2016-10-31 NOTE — ED Notes (Signed)
Patient ambulated to bathroom with one assist and cane.

## 2016-10-31 NOTE — ED Provider Notes (Signed)
[facility] DEPT Provider Note   CSN: [id number] Arrival date & time: 10/31/16  1223     History   Chief Complaint Chief Complaint  Patient presents with  . Weakness    HPI [name] [name] is a 83 y.o. female.  Patient had recent viral syndrome and now feels weak and tired. No chest pain, dyspnea, stiff neck, lower extremity edema, fever, sweats, chills, neuro deficits.  She apparently went to an urgent care center today and was transferred to the ED.      Past Medical History:  Diagnosis Date  . Adenomatous polyp of colon 12/16/2002   Dr. [name]/[facility]  . Allergy   . Cataract   . CHB (complete heart block) ([place]) 10/07/2014      . CHF (congestive heart failure) ([place])       . Chronic kidney disease, stage I 2006  . Constipation       . COPD (chronic obstructive pulmonary disease) ([place])       . DJD (degenerative joint disease) of lumbar spine   . DM (diabetes mellitus) ([place]) 2006  . Esophageal reflux   . Glaucoma   . Gout       . Heart murmur   . Hyperlipemia   . IDA (iron deficiency anemia)    Parenteral iron/[name]  . Insomnia       . Non-ischemic cardiomyopathy ([place])    a. s/p MDT CRTD  . Obesity, unspecified   . Other and unspecified hyperlipidemia   . Oxygen deficiency 2014   nocturnal;  . Spondylolisthesis   . Spondylosis   . Thyroid cancer ([place])    remote thyroidectomy, no recurrence, pt denies in 2016 thyroid cancer  . Unspecified hypothyroidism       . 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 Vertigo         Patient Active Problem List   Diagnosis Date Noted  . Nocturnal hypoxia 10/08/2016  . Dependence on nocturnal oxygen therapy 10/08/2016  . Hematuria 03/20/2016  . S/P ICD (internal cardiac defibrillator) procedure 02/08/2015  . Back pain without radiation 10/13/2014  . CHB (complete heart block) ([place]) 10/07/2014  . Fatigue 07/20/2014  . Anemia 06/24/2014  .  Osteopenia 02/10/2014  . Encounter for therapeutic drug monitoring 10/29/2013  . Spinal stenosis of lumbar region 02/19/2013  . OA (osteoarthritis) of knee 02/19/2013  . Hemolytic anemia ([place]) 09/24/2012  . H/O adenomatous polyp of colon 05/24/2012  . High risk medication use 05/24/2012  . COPD (chronic obstructive pulmonary disease) ([place]) 05/19/2012  . Chronic systolic heart failure ([place]) 05/19/2012  . Chronic anticoagulation, on coumadin for Mechanical AVR and MVR 04/01/2012  . Cardiorenal syndrome with renal failure 03/22/2012  . S/P AVR (aortic valve replacement) 03/22/2012  . S/P MVR (mitral valve replacement) 03/22/2012  . AICD (automatic cardioverter/defibrillator) present 03/22/2012  . CKD (chronic kidney disease), stage IV ([place]) 03/22/2012  . Warfarin-induced coagulopathy ([place]) 03/21/2012  . Papilloma of breast 03/06/2012  . Iron deficiency 10/04/2011  . Allergic rhinitis 02/14/2011  . Nausea without vomiting 06/07/2010  . HIP PAIN, RIGHT 04/23/2009  . VARICOSE VEINS LOWER EXTREMITIES W/OTH COMPS 02/09/2009  . Calculus of gallbladder 02/09/2009  . Type 2 diabetes mellitus ([place]) 06/15/2008  . Hypothyroidism, postsurgical 02/24/2008  . Hyperlipidemia LDL goal <70 02/24/2008  . Essential hypertension 02/24/2008  . Non-ischemic cardiomyopathy with ICD 02/24/2008  . GERD 02/24/2008  . DEGENERATIVE JOINT DISEASE, SPINE 02/24/2008    Past Surgical  History:  Procedure Laterality Date  . AORTIC AND MITRAL VALVE REPLACEMENT  2001  . BI-VENTRICULAR IMPLANTABLE CARDIOVERTER DEFIBRILLATOR UPGRADE N/A 01/18/2015   Procedure: BI-VENTRICULAR IMPLANTABLE CARDIOVERTER DEFIBRILLATOR UPGRADE;  Surgeon: Evans Lance, MD;  Location: [facility] CATH LAB;  Service: Cardiovascular;  Laterality: N/A;  . CARDIAC CATHETERIZATION    . CARDIAC VALVE REPLACEMENT    . COLONOSCOPY  06/12/2007   Rourk- Normal rectum/Normal colon  . COLONOSCOPY  12/16/02   small hemorrhoids  . COLONOSCOPY  06/20/2012    Procedure: COLONOSCOPY;  Surgeon: Daneil Dolin, MD;  Location: [facility]O SU[facility];  Service: Endoscopy;  Laterality: N/A;  10:45  . defibrillator implanted 2006    . DOPPLER ECHOCARDIOGRAPHY  2012  . DOPPLER ECHOCARDIOGRAPHY  05,06,07,08,09,2011  . ESOPHAGOGASTRODUODENOSCOPY  06/12/2007   Rourk- normal esophagus, small hiatal hernia, otherwise normal stomach, D1, D2  . EYE SURGERY Right 2005  . EYE SURGERY Left 2006  . ICD LEAD REMOVAL Left 02/08/2015   Procedure: ICD LEAD REMOVAL;  Surgeon: Evans Lance, MD;  [facility];  Service: Cardiovascular;  Laterality: Left;  "Will plan extraction and insertion of a BiV PM"  **Dr. Roxan Hockey backing up case**  . IMPLANTABLE CARDIOVERTER DEFIBRILLATOR (ICD) GENERATOR CHANGE Left 02/08/2015   Procedure: ICD GENERATOR CHANGE;  Surgeon: Evans Lance, MD;  Location: [facility];  Service: Cardiovascular;  Laterality: Left;  . INSERT / REPLACE / REMOVE PACEMAKER    . PACEMAKER INSERTION  May 2016  . pacemaker placed  2004  . right breast cyst removed     benign   . right cataract removed     2005  . THYROIDECTOMY      OB History    Gravida Para Term Preterm AB Living             0   SAB TAB Ectopic Multiple Live Births                   Home Medications    Prior to Admission medications   Medication Sig Start Date End Date Taking? Authorizing Provider  acetaminophen (TYLENOL) 500 MG tablet Take 500 mg by mouth every 6 (six) hours as needed (pain).   Yes Historical Provider, MD  albuterol (PROVENTIL) (2.5 MG/3ML) 0.083% nebulizer solution INHALE 1 VIAL VIA NEBULIZER THREE TIMES DAILY. Patient taking differently: INHALE 1 VIAL VIA NEBULIZER TWO TIMES DAILY. )AS NEEDED) 02/24/16  Yes Fayrene Helper, MD  albuterol (VENTOLIN HFA) 108 (90 Base) MCG/ACT inhaler Inhale 2 puffs into the lungs every 6 (six) hours as needed for wheezing or shortness of breath. 10/19/16  Yes Fayrene Helper, MD  allopurinol (ZYLOPRIM) 100 MG tablet Take 1 tablet  (100 mg total) by mouth daily. 04/27/16  Yes Fayrene Helper, MD  azelastine (ASTELIN) 0.1 % nasal spray Place 2 sprays into both nostrils 2 (two) times daily. Use in each nostril as directed 10/04/15  Yes Fayrene Helper, MD  benzonatate (TESSALON) 100 MG capsule Take 1 capsule (100 mg total) by mouth 2 (two) times daily as needed for cough. 10/19/16  Yes Fayrene Helper, MD  calcitRIOL (ROCALTROL) 0.25 MCG capsule Take 1 capsule by mouth 3 (three) times a week. 03/22/16  Yes Historical Provider, MD  cholecalciferol (VITAMIN D) 1000 UNITS tablet Take 1,000 Units by mouth daily.   Yes Historical Provider, MD  COUMADIN 5 MG tablet Take 2 tablets daily Patient taking differently: Take 10 mg by mouth every evening. Take 2 tablets daily 10/23/16  Yes Evans Lance, MD  digoxin (LANOXIN) 0.125 MG tablet Take 1 tablet (0.125 mg total) by mouth every other day. 12/22/15  Yes Mihai Croitoru, MD  docusate sodium (COLACE) 100 MG capsule Take 300 mg by mouth at bedtime.   Yes Historical Provider, MD  ezetimibe-simvastatin (VYTORIN) 10-20 MG tablet Take 1 tablet by mouth 3  times weekly Patient taking differently: Take 1 tablet by mouth every morning.  05/26/16  Yes Fayrene Helper, MD  fluticasone (FLONASE) 50 MCG/ACT nasal spray USE 2 SPRAYS IN EACH       NOSTRIL ONCE DAILY Patient taking differently: USE 2 SPRAYS IN EACH       NOSTRIL ONCE DAILY AS NEEDED FOR CONGESTION 10/25/16  Yes Fayrene Helper, MD  folic acid (FOLVITE) 1 MG tablet Take 1 tablet (1 mg total) by mouth daily. 04/04/16  Yes Fayrene Helper, MD  furosemide (LASIX) 40 MG tablet Take 1 tablet by mouth  daily Patient taking differently: Take 40 mg by mouth daily. Take 1 tablet by mouth  daily 04/27/16  Yes Fayrene Helper, MD  gabapentin (NEURONTIN) 300 MG capsule Take 1 capsule (300 mg total) by mouth 2 (two) times daily. Patient taking differently: Take 300 mg by mouth daily.  09/04/16  Yes Fayrene Helper, MD  hydrALAZINE  (APRESOLINE) 25 MG tablet Take 1 tablet (25 mg total) by mouth 3 (three) times daily. 05/16/16  Yes Fayrene Helper, MD  HYDROcodone-acetaminophen (NORCO) 7.5-325 MG tablet Take 1 tablet by mouth every 6 (six) hours as needed for moderate pain (Must last 30 days.Do not drive or operate machinery while taking this medicine.). 09/05/16  Yes Sanjuana Kava, MD  isosorbide mononitrate (IMDUR) 60 MG 24 hr tablet Take 1 tablet (60 mg total) by mouth daily. 08/21/16 11/19/16 Yes Troy Sine, MD  losartan (COZAAR) 50 MG tablet Take 1 tablet by mouth  daily Patient taking differently: Take 50 mg by mouth daily.  05/16/16  Yes Fayrene Helper, MD  metolazone (ZAROXOLYN) 2.5 MG tablet Take 1 tablet (2.5 mg total) by mouth once a week. 10/19/16  Yes Fayrene Helper, MD  ondansetron (ZOFRAN) 4 MG tablet Take 1 tablet (4 mg total) by mouth daily as needed for nausea or vomiting. 09/11/16  Yes Fayrene Helper, MD  Polyethyl [name] (SYSTANE OP) Place 1 drop into both eyes daily as needed (dry eyes).   Yes Historical Provider, MD  potassium chloride SA (KLOR-CON M20) 20 MEQ tablet Take 1 tablet by mouth  daily 04/27/16  Yes Fayrene Helper, MD  pravastatin (PRAVACHOL) 20 MG tablet Take 1 tablet (20 mg total) by mouth daily. 07/06/16  Yes Fayrene Helper, MD  promethazine-dextromethorphan (PROMETHAZINE-DM) 6.25-15 MG/5ML syrup One teaspoon at bedtime, as needed, for excessive cough Patient taking differently: Take 5 mLs by mouth at bedtime as needed for cough. as needed, for excessive cough 10/04/16  Yes Fayrene Helper, MD  SYNTHROID 75 MCG tablet Take 1 tablet by mouth  daily 09/04/16  Yes Fayrene Helper, MD  TOPROL XL 100 MG 24 hr tablet Take 1 tablet (100 mg total) by mouth daily. Take with or immediately following a meal. 10/19/16  Yes Fayrene Helper, MD  cephALEXin (KEFLEX) 500 MG capsule Take 1 capsule (500 mg total) by mouth 4 (four) times daily. 10/31/16   Nat Christen, MD     Family History Family History  Problem Relation Age of Onset  . Cancer Mother  behind pancres  . Heart disease Father   . Heart disease Sister     Heart attack  . Heart disease Brother   . Diabetes Brother   . Heart disease Brother   . Diabetes Brother   . Hypertension Brother   . Cancer Brother     lung    Social History Social History  Substance Use Topics  . Smoking status: Former Smoker    Packs/day: 0.75    Years: 40.00    Types: Cigarettes    Quit date: 03/08/1987  . Smokeless tobacco: Never Used  . Alcohol use No     Allergies   Aspirin; Fentanyl; Niacin; and Penicillins   Review of Systems Review of Systems  All other systems reviewed and are negative.    Physical Exam Updated Vital Signs BP 154/71   Pulse 70   Temp 98.4 F (36.9 C) (Oral)   Resp 26   Ht 5\' 7"  (1.702 m)   Wt 219 lb (99.3 kg)   SpO2 93%   BMI 34.30 kg/m   Physical Exam  Constitutional: She is oriented to person, place, and time.  Obese, no acute distress, well-hydrated  HENT:  Head: Normocephalic and atraumatic.  Eyes: Conjunctivae are normal.  Neck: Neck supple.  Cardiovascular: Normal rate and regular rhythm.   Pulmonary/Chest: Effort normal and breath sounds normal.  Abdominal: Soft. Bowel sounds are normal.  Musculoskeletal: Normal range of motion.  Neurological: She is alert and oriented to person, place, and time.  Skin: Skin is warm and dry.  Psychiatric: She has a normal mood and affect. Her behavior is normal.  Nursing note and vitals reviewed.    ED Treatments / Results  Labs (all labs ordered are listed, but only abnormal results are displayed) Labs Reviewed  BASIC METABOLIC PANEL - Abnormal; Notable for the following:       Result Value   Glucose, Bld 115 (*)    BUN 34 (*)    Creatinine, Ser 1.61 (*)    GFR calc non Af Amer 29 (*)    GFR calc Af Amer 33 (*)    All other components within normal limits  CBC - Abnormal; Notable for the  following:    RBC 3.56 (*)    Hemoglobin 10.8 (*)    HCT 33.3 (*)    RDW 15.9 (*)    Platelets 133 (*)    All other components within normal limits  URINALYSIS, ROUTINE W REFLEX MICROSCOPIC - Abnormal; Notable for the following:    Protein, ur 30 (*)    Leukocytes, UA LARGE (*)    Bacteria, UA RARE (*)    All other components within normal limits  TROPONIN I - Abnormal; Notable for the following:    Troponin I 0.03 (*)    All other components within normal limits  TROPONIN I - Abnormal; Notable for the following:    Troponin I 0.03 (*)    All other components within normal limits  URINE CULTURE    EKG  EKG Interpretation None       Radiology No results found.  Procedures Procedures (including critical care time)  Medications Ordered in ED Medications  cephALEXin (KEFLEX) capsule 500 mg (500 mg Oral Given 10/31/16 1838)     Initial Impression / Assessment and Plan / ED Course  I have reviewed the triage vital signs and the nursing notes.  Pertinent labs & imaging results that were available during my care of the patient were reviewed by  me and considered in my medical decision making (see chart for details).     Patient is in no acute distress. No clinical evidence of dehydration or heart failure. She does have a minor urinary infection. Trop's were 0.032 different readings. Discharge medications cephalexin 500 mg. Discussed with the patient and her niece  Final Clinical Impressions(s) / ED Diagnoses   Final diagnoses:  Weakness  Urinary tract infection without hematuria, site unspecified    New Prescriptions New Prescriptions   CEPHALEXIN (KEFLEX) 500 MG CAPSULE    Take 1 capsule (500 mg total) by mouth 4 (four) times daily.     Nat Christen, MD 10/31/16 2029

## 2016-11-02 ENCOUNTER — Other Ambulatory Visit: Payer: Self-pay

## 2016-11-02 ENCOUNTER — Other Ambulatory Visit: Payer: Self-pay | Admitting: Cardiovascular Disease

## 2016-11-02 LAB — URINE CULTURE: Culture: <10000 — AB

## 2016-11-02 MED ORDER — PRAVASTATIN SODIUM 20 MG PO TABS: 20.0000 mg | ORAL_TABLET | Freq: Every day | ORAL | 1 refills | Status: DC

## 2016-11-02 MED ORDER — FUROSEMIDE 40 MG PO TABS: ORAL_TABLET | ORAL | 1 refills | Status: DC

## 2016-11-02 MED ORDER — ALLOPURINOL 100 MG PO TABS: 100.0000 mg | ORAL_TABLET | Freq: Every day | ORAL | 1 refills | Status: DC

## 2016-11-02 NOTE — Telephone Encounter (Signed)
Rx(s) sent to pharmacy electronically.  

## 2016-11-03 ENCOUNTER — Ambulatory Visit (INDEPENDENT_AMBULATORY_CARE_PROVIDER_SITE_OTHER): Payer: Medicare Other | Admitting: Pharmacist

## 2016-11-03 ENCOUNTER — Encounter: Payer: Self-pay | Admitting: Cardiovascular Disease

## 2016-11-03 ENCOUNTER — Other Ambulatory Visit: Payer: Self-pay

## 2016-11-03 ENCOUNTER — Encounter: Payer: Self-pay | Admitting: Internal Medicine

## 2016-11-03 ENCOUNTER — Ambulatory Visit (INDEPENDENT_AMBULATORY_CARE_PROVIDER_SITE_OTHER): Payer: Medicare Other | Admitting: Cardiovascular Disease

## 2016-11-03 VITALS — BP 138/78 | HR 79 | Ht 67.0 in | Wt 217.8 lb

## 2016-11-03 DIAGNOSIS — I5022 Chronic systolic (congestive) heart failure: Secondary | ICD-10-CM | POA: Diagnosis not present

## 2016-11-03 DIAGNOSIS — Z7901 Long term (current) use of anticoagulants: Secondary | ICD-10-CM | POA: Diagnosis not present

## 2016-11-03 DIAGNOSIS — E669 Obesity, unspecified: Secondary | ICD-10-CM | POA: Diagnosis not present

## 2016-11-03 DIAGNOSIS — Z5181 Encounter for therapeutic drug level monitoring: Secondary | ICD-10-CM

## 2016-11-03 DIAGNOSIS — Z95810 Presence of automatic (implantable) cardiac defibrillator: Secondary | ICD-10-CM | POA: Diagnosis not present

## 2016-11-03 DIAGNOSIS — I442 Atrioventricular block, complete: Secondary | ICD-10-CM

## 2016-11-03 DIAGNOSIS — D594 Other nonautoimmune hemolytic anemias: Secondary | ICD-10-CM

## 2016-11-03 DIAGNOSIS — E1122 Type 2 diabetes mellitus with diabetic chronic kidney disease: Secondary | ICD-10-CM

## 2016-11-03 DIAGNOSIS — Z952 Presence of prosthetic heart valve: Secondary | ICD-10-CM

## 2016-11-03 DIAGNOSIS — I481 Persistent atrial fibrillation: Secondary | ICD-10-CM | POA: Diagnosis not present

## 2016-11-03 DIAGNOSIS — N184 Chronic kidney disease, stage 4 (severe): Secondary | ICD-10-CM

## 2016-11-03 DIAGNOSIS — Z01812 Encounter for preprocedural laboratory examination: Secondary | ICD-10-CM | POA: Diagnosis not present

## 2016-11-03 DIAGNOSIS — D689 Coagulation defect, unspecified: Secondary | ICD-10-CM | POA: Diagnosis not present

## 2016-11-03 DIAGNOSIS — I10 Essential (primary) hypertension: Secondary | ICD-10-CM | POA: Diagnosis not present

## 2016-11-03 DIAGNOSIS — I255 Ischemic cardiomyopathy: Secondary | ICD-10-CM | POA: Diagnosis not present

## 2016-11-03 DIAGNOSIS — J410 Simple chronic bronchitis: Secondary | ICD-10-CM | POA: Diagnosis not present

## 2016-11-03 DIAGNOSIS — I4819 Other persistent atrial fibrillation: Secondary | ICD-10-CM

## 2016-11-03 LAB — POCT INR: INR: 1.8

## 2016-11-03 LAB — CBC
HCT: 33.3 % — ABNORMAL LOW (ref 35.0–45.0)
Hemoglobin: 10.6 g/dL — ABNORMAL LOW (ref 11.7–15.5)
MCH: 29 pg (ref 27.0–33.0)
MCHC: 31.8 g/dL — ABNORMAL LOW (ref 32.0–36.0)
MCV: 91.2 fL (ref 80.0–100.0)
MPV: 10.7 fL (ref 7.5–12.5)
Platelets: 173 10*3/uL (ref 140–400)
RBC: 3.65 MIL/uL — ABNORMAL LOW (ref 3.80–5.10)
RDW: 16.3 % — ABNORMAL HIGH (ref 11.0–15.0)
WBC: 5.5 10*3/uL (ref 3.8–10.8)

## 2016-11-03 LAB — BASIC METABOLIC PANEL
BUN: 29 mg/dL — ABNORMAL HIGH (ref 7–25)
CO2: 27 mmol/L (ref 20–31)
Calcium: 9.5 mg/dL (ref 8.6–10.4)
Chloride: 100 mmol/L (ref 98–110)
Creat: 1.54 mg/dL — ABNORMAL HIGH (ref 0.60–0.88)
Glucose, Bld: 105 mg/dL — ABNORMAL HIGH (ref 65–99)
Potassium: 4.4 mmol/L (ref 3.5–5.3)
Sodium: 137 mmol/L (ref 135–146)

## 2016-11-03 NOTE — Progress Notes (Signed)
Cardiology Office Note    Date:  11/04/2016   ID:  [name] [name], DOB 01-07-1932, MRN [id number]  PCP:  [name] Nakayama, MD  Cardiologist:  [name] [name], M.D. (EP/ICD); Sanda Klein, MD   Chief Complaint  Patient presents with  . Follow-up    History of Present Illness:  [name] [name] is a 83 y.o. female returning in follow-up for valvular heart disease, complete heart block and chronic combined systolic and diastolic heart failure.  She had "flu" in January (I am not sure if this was confirmed influenza or another viral illness). She received a prescription for azithromycin on January 10th. She was seen in the emergency room on January 15 with dyspnea, but was not found to be in overt heart failure. She received a single dose of intravenous furosemide as well as nebulized bronchodilators. She was discharged with prednisone. Her dyspnea has improved since then and remains better than it was before she had her CRT-D implanted. However she complains of substantial fatigue for several weeks. He was seen in the emergency room for weakness on February 6. She was prescribed cephalexin 500 mg 4 times daily for a "minor urinary infection".  Device interrogation shows that she has been in persistent atrial fibrillation since October 24. Ventricular rate control is not an issue due to heart block. Biventricular pacing is still present 98.6% of the time. Thoracic impedance has shown gradual fluid accumulation since mid January. No significant ventricular arrhythmia has been recorded. Lead parameters remain good.  She has not had any bleeding complications and usually her anticoagulation is excellent. Just today her INR was low at 1.8. It had not been below 2.0 since last August.  She has a compensated heart failure symptoms (NYHA functional class II) and does not have frequent problems with leg swelling. In the hot weather at the end of the day she may have mild edema that resolves by the next  morning. She denies chest pain, palpitations, syncope, focal neurological events, other embolic events, bleeding problems.  She saw Dr. Cristopher [name] on June 30 for a CRT-D device check. Device function was normal. No reprogramming was necessary.  She is pacemaker dependent due to complete heart block following aortic and mitral valve replacement with mechanical prostheses. (AVR 2001, MVR 2004, both [employer] prostheses, surgeries performed in [place]). She has severe cardiomyopathy with an estimated left ventricular ejection fraction of 25%, unchanged since 2004. She did not have any coronary artery disease by angiography before her first operation. In May 2016 her initial pacemaker lead was extracted and she received a CRT-D Medtronic Viva XT device by Dr. Cristopher [name].  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  Her most recent hemoglobin on 03/30/2016 was 10.8 with normocytic normochromic indices. Her ferritin is quite high, not consistent with iron deficiency. She has chronic kidney disease and her most recent creatinine was 1.54 on 11/03/2016, with an estimated GFR of around 30 mL/minute. She has diabetes mellitus and hyperlipidemia and hyperuricemia. Hemoglobin A1c in September 2017 was 5.8%. In January 2018 her LDL cholesterol was 81, triglycerides moderately elevated at 265. Has COPD and uses oxygen at night only..   Past Medical History:  Diagnosis Date  . Adenomatous polyp of colon 12/16/2002   Dr. [name]/[facility]  . Allergy   . Cataract   .  CHB (complete heart block) ([place]) 10/07/2014      . CHF (congestive heart failure) ([place])       . Chronic kidney disease, stage I 2006  . Constipation       . COPD (chronic obstructive pulmonary disease) ([place])       . DJD (degenerative  joint disease) of lumbar spine   . DM (diabetes mellitus) ([place]) 2006  . Esophageal reflux   . Glaucoma   . Gout       . Heart murmur   . Hyperlipemia   . IDA (iron deficiency anemia)    Parenteral iron/[name]  . Insomnia       . Non-ischemic cardiomyopathy ([place])    a. s/p MDT CRTD  . Obesity, unspecified   . Other and unspecified hyperlipidemia   . Oxygen deficiency 2014   nocturnal;  . Spondylolisthesis   . Spondylosis   . Thyroid cancer ([place])    remote thyroidectomy, no recurrence, pt denies in 2016 thyroid cancer  . Unspecified hypothyroidism       . 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 Vertigo         Past Surgical History:  Procedure Laterality Date  . AORTIC AND MITRAL VALVE REPLACEMENT  2001  . BI-VENTRICULAR IMPLANTABLE CARDIOVERTER DEFIBRILLATOR UPGRADE N/A 01/18/2015   Procedure: BI-VENTRICULAR IMPLANTABLE CARDIOVERTER DEFIBRILLATOR UPGRADE;  Surgeon: Evans Lance, MD;  Location[facility] CATH LAB;  Service: Cardiovascular;  Laterality: N/A;  . CARDIAC CATHETERIZATION    . CARDIAC VALVE REPLACEMENT    . COLONOSCOPY  06/12/2007   Rourk- Normal rectum/Normal colon  . COLONOSCOPY  12/16/02   small hemorrhoids  . COLONOSCOPY  06/20/2012   Procedure: COLONOSCOPY;  Surgeon: Daneil Dolin, MD;  Location: [facility] ENDO SU[facility];  Service: Endoscopy;  Laterality: N/A;  10:45  . defibrillator implanted 2006    . DOPPLER ECHOCARDIOGRAPHY  2012  . DOPPLER ECHOCARDIOGRAPHY  05,06,07,08,09,2011  . ESOPHAGOGASTRODUODENOSCOPY  06/12/2007   Rourk- normal esophagus, small hiatal hernia, otherwise normal stomach, D1, D2  . EYE SURGERY Right 2005  . EYE SURGERY Left 2006  . ICD LEAD REMOVAL Left 02/08/2015   Procedure: ICD LEAD REMOVAL;  Surgeon: Evans Lance, MD;  Location: [facility] OR;  Service: Cardiovascular;  Laterality: Left;  "Will plan extraction and insertion of a BiV PM"  **Dr. Roxan Hockey backing up case**  . IMPLANTABLE CARDIOVERTER DEFIBRILLATOR  (ICD) GENERATOR CHANGE Left 02/08/2015   Procedure: ICD GENERATOR CHANGE;  Surgeon: Evans Lance, MD;  Location: [place];  Service: Cardiovascular;  Laterality: Left;  . INSERT / REPLACE / REMOVE PACEMAKER    . PACEMAKER INSERTION  May 2016  . pacemaker placed  2004  . right breast cyst removed     benign   . right cataract removed     2005  . THYROIDECTOMY      Current Medications: Outpatient Medications Prior to Visit  Medication Sig Dispense Refill  . acetaminophen (TYLENOL) 500 MG tablet Take 500 mg by mouth every 6 (six) hours as needed (pain).    Marland Kitchen albuterol (PROVENTIL) (2.5 MG/3ML) 0.083% nebulizer solution INHALE 1 VIAL VIA NEBULIZER THREE TIMES DAILY. (Patient taking differently: INHALE 1 VIAL VIA NEBULIZER TWO TIMES DAILY. )AS NEEDED)) 300 mL 2  . albuterol (VENTOLIN HFA) 108 (90 Base) MCG/ACT inhaler Inhale 2 puffs into the lungs every 6 (six) hours as needed for wheezing or shortness of breath. 3 Inhaler 3  . allopurinol (ZYLOPRIM) 100 MG tablet Take  1 tablet (100 mg total) by mouth daily. 90 tablet 1  . azelastine (ASTELIN) 0.1 % nasal spray Place 2 sprays into both nostrils 2 (two) times daily. Use in each nostril as directed 30 mL 12  . benzonatate (TESSALON) 100 MG capsule Take 1 capsule (100 mg total) by mouth 2 (two) times daily as needed for cough. 30 capsule 0  . calcitRIOL (ROCALTROL) 0.25 MCG capsule Take 1 capsule by mouth 3 (three) times a week.  0  . cephALEXin (KEFLEX) 500 MG capsule Take 1 capsule (500 mg total) by mouth 4 (four) times daily. 28 capsule 0  . cholecalciferol (VITAMIN D) 1000 UNITS tablet Take 1,000 Units by mouth daily.    Marland Kitchen COUMADIN 5 MG tablet Take 2 tablets daily (Patient taking differently: Take 10 mg by mouth every evening. Take 2 tablets daily) 10 tablet 1  . digoxin (LANOXIN) 0.125 MG tablet Take 1 tablet (0.125 mg total) by mouth every other day. 90 tablet 3  . docusate sodium (COLACE) 100 MG capsule Take 300 mg by mouth at bedtime.    Marland Kitchen  ezetimibe-simvastatin (VYTORIN) 10-20 MG tablet Take 1 tablet by mouth 3  times weekly (Patient taking differently: Take 1 tablet by mouth every morning. ) 36 tablet 1  . fluticasone (FLONASE) 50 MCG/ACT nasal spray USE 2 SPRAYS IN EACH       NOSTRIL ONCE DAILY (Patient taking differently: USE 2 SPRAYS IN EACH       NOSTRIL ONCE DAILY AS NEEDED FOR CONGESTION) 48 g 1  . folic acid (FOLVITE) 1 MG tablet Take 1 tablet (1 mg total) by mouth daily. 100 tablet 3  . furosemide (LASIX) 40 MG tablet Take 1 tablet by mouth  daily 90 tablet 1  . gabapentin (NEURONTIN) 300 MG capsule Take 1 capsule (300 mg total) by mouth 2 (two) times daily. (Patient taking differently: Take 300 mg by mouth daily. ) 180 capsule 1  . hydrALAZINE (APRESOLINE) 25 MG tablet Take 1 tablet (25 mg total) by mouth 3 (three) times daily. 270 tablet 3  . HYDROcodone-acetaminophen (NORCO) 7.5-325 MG tablet Take 1 tablet by mouth every 6 (six) hours as needed for moderate pain (Must last 30 days.Do not drive or operate machinery while taking this medicine.). 110 tablet 0  . isosorbide mononitrate (IMDUR) 60 MG 24 hr tablet take 1 tablet by mouth once daily 90 tablet 1  . losartan (COZAAR) 50 MG tablet Take 1 tablet by mouth  daily (Patient taking differently: Take 50 mg by mouth daily. ) 90 tablet 1  . metolazone (ZAROXOLYN) 2.5 MG tablet Take 1 tablet (2.5 mg total) by mouth once a week. 12 tablet 2  . ondansetron (ZOFRAN) 4 MG tablet Take 1 tablet (4 mg total) by mouth daily as needed for nausea or vomiting. 90 tablet 0  . Polyethyl [name] (SYSTANE OP) Place 1 drop into both eyes daily as needed (dry eyes).    . potassium chloride SA (KLOR-CON M20) 20 MEQ tablet Take 1 tablet by mouth  daily 90 tablet 1  . pravastatin (PRAVACHOL) 20 MG tablet Take 1 tablet (20 mg total) by mouth daily. 90 tablet 1  . promethazine-dextromethorphan (PROMETHAZINE-DM) 6.25-15 MG/5ML syrup One teaspoon at bedtime, as needed, for excessive cough  (Patient taking differently: Take 5 mLs by mouth at bedtime as needed for cough. as needed, for excessive cough) 180 mL 0  . SYNTHROID 75 MCG tablet Take 1 tablet by mouth  daily 90 tablet 1  .  TOPROL XL 100 MG 24 hr tablet Take 1 tablet (100 mg total) by mouth daily. Take with or immediately following a meal. 90 tablet 1   Facility-Administered Medications Prior to Visit  Medication Dose Route Frequency Provider Last Rate Last Dose  . epoetin alfa (EPOGEN,PROCRIT) injection 40,000 Units  40,000 Units Subcutaneous Once Farrel Gobble, MD         Allergies:   Penicillins; Aspirin; Fentanyl; and Niacin   Social History   Social History  . Marital status: Widowed    Spouse name: N/A  . Number of children: 0  . Years of education: N/A   Occupational History  . retired Retired   Social History Main Topics  . Smoking status: Former Smoker    Packs/day: 0.75    Years: 40.00    Types: Cigarettes    Quit date: 03/08/1987  . Smokeless tobacco: Never Used  . Alcohol use No  . Drug use: No  . Sexual activity: Not Currently   Other Topics Concern  . None   Social History Narrative   From [place] (2004)     Family History:  The patient's family history includes Cancer in her brother and mother; Diabetes in her brother and brother; Heart disease in her brother, brother, father, and sister; Hypertension in her brother.   ROS:   Please see the history of present illness.    ROS All other systems reviewed and are negative.   PHYSICAL EXAM:   VS:  BP 138/78 (BP Location: Right Arm, Patient Position: Sitting, Cuff Size: Normal)   Pulse 79   Ht 5\' 7"  (1.702 m)   Wt 98.8 kg (217 lb 12.8 oz)   SpO2 95%   BMI 34.11 kg/m    GEN: Well nourished, well developed, in no acute distress  HEENT: normal  Neck: no JVD, carotid bruits, or masses Cardiac: Crisp mechanical prosthetic valve clicks RRR; 1/6 aortic ejection murmur is early peaking, no diastolic murmurs, rubs, or gallops,no edema   Respiratory:  clear to auscultation bilaterally, normal work of breathing GI: soft, nontender, nondistended, + BS MS: no deformity or atrophy  Skin: warm and dry, no rash Neuro:  Alert and Oriented x 3, Strength and sensation are intact Psych: euthymic mood, full affect  Wt Readings from Last 3 Encounters:  11/03/16 98.8 kg (217 lb 12.8 oz)  10/31/16 99.3 kg (219 lb)  10/19/16 97.5 kg (215 lb)      Studies/Labs Reviewed:   EKG:  EKG is ordered today.  The ekg ordered today demonstrates AV sequential pacing with occasional PVCs  Recent Labs: 10/09/2016: B Natriuretic Peptide 232.0 10/16/2016: ALT 22 11/03/2016: BUN 29; Creat 1.54; Hemoglobin 10.6; Platelets 173; Potassium 4.4; Sodium 137; TSH 1.51   Lipid Panel    Component Value Date/Time   CHOL 165 10/16/2016 1213   TRIG 265 (H) 10/16/2016 1213   HDL 31 (L) 10/16/2016 1213   CHOLHDL 5.1 10/14/2015 1130   VLDL 40 10/14/2015 1130   LDLCALC 81 10/16/2016 1213     ASSESSMENT:    1. Persistent atrial fibrillation ([place])   2. Chronic systolic heart failure ([place])   3. CHB (complete heart block) ([facility])   4. Biventricular ICD (implantable cardioverter-defibrillator) in place   5. S/P MVR (mitral valve replacement)   6. S/P AVR (aortic valve replacement)   7. Chronic anticoagulation, on coumadin for Mechanical AVR and MVR   8. Other non-autoimmune hemolytic anemias ([place])   9. Essential hypertension   10. CKD (chronic kidney  disease), stage IV ([place])   11. Simple chronic bronchitis ([place])   12. Type 2 diabetes mellitus with chronic kidney disease, without long-term current use of insulin, unspecified CKD stage ([place])   13. Mild obesity      PLAN:  In order of problems listed above:  1. AFib: Her current symptom decompensation might be secondary to atrial fibrillation, which has now been persistent for a couple of months. Would like to see if symptoms improve after cardioversion. Unfortunately her anticoagulation which is  usually excellent is now slightly subpar. Will have to do a TEE guided cardioversion. The TEE will enable [place] to get a closer look at her prosthetic valves and make sure that these are also functioning normally. The risks and benefits of TEE, cardioversion, sedation with anesthesiology were all discussed in detail.  2. CHF: She appears well compensated, euvolemic/NYHA functional class II on a very low dose of loop diuretic as well as a low dose of metolazone once a week. Her weight is actually a few pounds lower than it was when I saw her last July. Continue current management. Reminded about the importance of daily weights and sodium restriction. She had a modest improvement in left ventricular ejection fraction following initiation of CRT. 3. CHB: Pacemaker dependent. 4. CRT-D: device checked today with normal findings except for the presence of persistent atrial fibrillation and Optivol evidence of early fluid overload  5. Mechanical mitral valve prosthesis: INR anticoagulation target 2.5-3.5  6. Mechanical aortic valve prosthesis 7. Warfarin: Until today, the last time her INR was subtherapeutic was August 2017. Dose adjusted today. 8. 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 Myoglobin is consistently around 10.6; note suppressed haptoglobin in the past and normal iron stores  9. HTN: Well controlled usually, slightly high today. No change in medications. She is on hydralazine/nitrates, ARB, beta blocker (metoprolol preferred to carvedilol due to reactive airway disease). 10. CKD: Stable and improved from earlier in the year 35. COPD/asthma: Seems to be well compensated. Her wheezing today. Uses supplemental oxygen at night only.  12. DM: Well controlled 13. Obesity: Weight loss recommended    Medication Adjustments/Labs and Tests Ordered: Current medicines are reviewed at length with the patient today.  Concerns regarding  medicines are outlined above.  Medication changes, Labs and Tests ordered today are listed in the Patient Instructions below. Patient Instructions  Your physician has requested that you have a TEE/Cardioversion Tuesday, February 13th, 2018 at 11:00a with Dr Sallyanne Kuster. During a TEE, sound waves are used to create images of your heart. It provides your doctor with information about the size and shape of your heart and how well your heart's chambers and valves are working. In this test, a transducer is attached to the end of a flexible tube that is guided down you throat and into your esophagus (the tube leading from your mouth to your stomach) to get a more detailed image of your heart. Once the TEE has determined that a blood clot is not present, the cardioversion begins. Electrical Cardioversion uses a jolt of electricity to your heart either through paddles or wired patches attached to your chest. This is a controlled, usually prescheduled, procedure. This procedure is done at the hospital and you are not awake during the procedure. You usually go home the day of the procedure. Please see the instruction sheet given to you today for more information.  Your physician recommends that you return for lab work TODAY.  Transesophageal Echocardiogram Transesophageal echocardiography (TEE) is a special type of test that produces images of the heart by using sound waves (echocardiogram). This type of echocardiography can obtain better images of the heart than standard echocardiography. TEE is done by passing a flexible tube down the esophagus. The heart is located in front of the esophagus. Because the heart and esophagus are close to one another, your health care provider can take very clear, detailed pictures of the heart via ultrasound waves. TEE may be done:  If your health care provider needs more information based on standard echocardiography findings.  If you had a stroke. This might have happened  because a clot formed in your heart. TEE can visualize different areas of the heart and check for clots.  To check valve anatomy and function.  To check for infection on the inside of your heart (endocarditis).  To evaluate the dividing wall (septum) of the heart and presence of a hole that did not close after birth (patent foramen ovale or atrial septal defect).  To help diagnose a tear in the wall of the aorta (aortic dissection).  During cardiac valve surgery. This allows the surgeon to assess the valve repair before closing the chest.  During a variety of other cardiac procedures to guide positioning of catheters.  Sometimes before a cardioversion, which is a shock to convert heart rhythm back to normal. LET [facility] CARE PROVIDER KNOW ABOUT:   Any allergies you have.  All medicines you are taking, including vitamins, herbs, eye drops, creams, and over-the-counter medicines.  Previous problems you or members of your family have had with the use of anesthetics.  Any blood disorders you have.  Previous surgeries you have had.  Medical conditions you have.  Swallowing difficulties.  An esophageal obstruction. RISKS AND COMPLICATIONS  Generally, TEE is a safe procedure. However, as with any procedure, complications can occur. Possible complications include an esophageal tear (rupture). BEFORE THE PROCEDURE   Do not eat or drink for 6 hours before the procedure or as directed by your health care provider.  Arrange for someone to drive you home after the procedure. Do not drive yourself home. During the procedure, you will be given medicines that can continue to make you feel drowsy and can impair your reflexes.  An IV access tube will be started in the arm. PROCEDURE   A medicine to help you relax (sedative) will be given through the IV access tube.  A medicine may be sprayed or gargled to numb the back of the throat.  Your blood pressure, heart rate, and breathing  (vital signs) will be monitored during the procedure.  The TEE probe is a long, flexible tube. The tip of the probe is placed into the back of the mouth, and you will be asked to swallow. This helps to pass the tip of the probe into the esophagus. Once the tip of the probe is in the correct area, your health care provider can take pictures of the heart.  TEE is usually not a painful procedure. You may feel the probe press against the back of the throat. The probe does not enter the trachea and does not affect your breathing. AFTER THE PROCEDURE   You will be in bed, resting, until you have fully returned to consciousness.  When you first awaken, your throat may feel slightly sore and will probably still feel numb. This will improve slowly over time.  You will not be allowed to eat or drink until  it is clear that the numbness has improved.  Once you have been able to drink, urinate, and sit on the edge of the bed without feeling sick to your stomach (nausea) or dizzy, you may be cleared to go home.  You should have a friend or family member with you for the next 24 hours after your procedure. This information is not intended to replace advice given to you by your health care provider. Make sure you discuss any questions you have with your health care provider. Document Released: 12/02/2002 Document Revised: 09/16/2013 Document Reviewed: 03/13/2013 Elsevier Interactive Patient Education  2017 [employer].  Hospital doctor cardioversion is the delivery of a jolt of electricity to restore a normal rhythm to the heart. A rhythm that is too fast or is not regular keeps the heart from pumping well. In this procedure, sticky patches or metal paddles are placed on the chest to deliver electricity to the heart from a device. This procedure may be done in an emergency if:  There is low or no blood pressure as a result of the heart rhythm.  Normal rhythm must be restored as fast  as possible to protect the brain and heart from further damage.  It may save a life. This procedure may also be done for irregular or fast heart rhythms that are not immediately life-threatening. Tell a health care provider about:  Any allergies you have.  All medicines you are taking, including vitamins, herbs, eye drops, creams, and over-the-counter medicines.  Any problems you or family members have had with anesthetic medicines.  Any blood disorders you have.  Any surgeries you have had.  Any medical conditions you have.  Whether you are pregnant or may be pregnant. What are the risks? Generally, this is a safe procedure. However, problems may occur, including:  Allergic reactions to medicines.  A blood clot that breaks free and travels to other parts of your body.  The possible return of an abnormal heart rhythm within hours or days after the procedure.  Your heart stopping (cardiac arrest). This is rare. What happens before the procedure? Medicines  Your health care provider may have you start taking:  Blood-thinning medicines (anticoagulants) so your blood does not clot as easily.  Medicines may be given to help stabilize your heart rate and rhythm.  Ask your health care provider about changing or stopping your regular medicines. This is especially important if you are taking diabetes medicines or blood thinners. General instructions  Plan to have someone take you home from the hospital or clinic.  If you will be going home right after the procedure, plan to have someone with you for 24 hours.  Follow instructions from your health care provider about eating or drinking restrictions. What happens during the procedure?  To lower your risk of infection:  Your health care team will wash or sanitize their hands.  Your skin will be washed with soap.  An IV tube will be inserted into one of your veins.  You will be given a medicine to help you relax  (sedative).  Sticky patches (electrodes) or metal paddles may be placed on your chest.  An electrical shock will be delivered. The procedure may vary among health care providers and hospitals. What happens after the procedure?  Your blood pressure, heart rate, breathing rate, and blood oxygen level will be monitored until the medicines you were given have worn off.  Do not drive for 24 hours if you were given a sedative.  Your heart rhythm will be watched to make sure it does not change. This information is not intended to replace advice given to you by your health care provider. Make sure you discuss any questions you have with your health care provider. Document Released: 09/01/2002 Document Revised: 05/10/2016 Document Reviewed: 03/17/2016 Elsevier Interactive Patient Education  2017 [place], Sanda Klein, MD  11/04/2016 9:46 AM    [facility] [address], [place], [place]  [address] Phone: [phone]; Fax: [phone]

## 2016-11-03 NOTE — Patient Instructions (Addendum)
Your physician has requested that you have a TEE/Cardioversion Tuesday, February 13th, 2018 at 11:00a with Dr Sallyanne Kuster. During a TEE, sound waves are used to create images of your heart. It provides your doctor with information about the size and shape of your heart and how well your heart's chambers and valves are working. In this test, a transducer is attached to the end of a flexible tube that is guided down you throat and into your esophagus (the tube leading from your mouth to your stomach) to get a more detailed image of your heart. Once the TEE has determined that a blood clot is not present, the cardioversion begins. Electrical Cardioversion uses a jolt of electricity to your heart either through paddles or wired patches attached to your chest. This is a controlled, usually prescheduled, procedure. This procedure is done at the hospital and you are not awake during the procedure. You usually go home the day of the procedure. Please see the instruction sheet given to you today for more information.  Your physician recommends that you return for lab work TODAY.   Transesophageal Echocardiogram Transesophageal echocardiography (TEE) is a special type of test that produces images of the heart by using sound waves (echocardiogram). This type of echocardiography can obtain better images of the heart than standard echocardiography. TEE is done by passing a flexible tube down the esophagus. The heart is located in front of the esophagus. Because the heart and esophagus are close to one another, your health care provider can take very clear, detailed pictures of the heart via ultrasound waves. TEE may be done:  If your health care provider needs more information based on standard echocardiography findings.  If you had a stroke. This might have happened because a clot formed in your heart. TEE can visualize different areas of the heart and check for clots.  To check valve anatomy and function.  To check  for infection on the inside of your heart (endocarditis).  To evaluate the dividing wall (septum) of the heart and presence of a hole that did not close after birth (patent foramen ovale or atrial septal defect).  To help diagnose a tear in the wall of the aorta (aortic dissection).  During cardiac valve surgery. This allows the surgeon to assess the valve repair before closing the chest.  During a variety of other cardiac procedures to guide positioning of catheters.  Sometimes before a cardioversion, which is a shock to convert heart rhythm back to normal. LET [facility] CARE PROVIDER KNOW ABOUT:   Any allergies you have.  All medicines you are taking, including vitamins, herbs, eye drops, creams, and over-the-counter medicines.  Previous problems you or members of your family have had with the use of anesthetics.  Any blood disorders you have.  Previous surgeries you have had.  Medical conditions you have.  Swallowing difficulties.  An esophageal obstruction. RISKS AND COMPLICATIONS  Generally, TEE is a safe procedure. However, as with any procedure, complications can occur. Possible complications include an esophageal tear (rupture). BEFORE THE PROCEDURE   Do not eat or drink for 6 hours before the procedure or as directed by your health care provider.  Arrange for someone to drive you home after the procedure. Do not drive yourself home. During the procedure, you will be given medicines that can continue to make you feel drowsy and can impair your reflexes.  An IV access tube will be started in the arm. PROCEDURE   A medicine to help you relax (  sedative) will be given through the IV access tube.  A medicine may be sprayed or gargled to numb the back of the throat.  Your blood pressure, heart rate, and breathing (vital signs) will be monitored during the procedure.  The TEE probe is a long, flexible tube. The tip of the probe is placed into the back of the mouth, and  you will be asked to swallow. This helps to pass the tip of the probe into the esophagus. Once the tip of the probe is in the correct area, your health care provider can take pictures of the heart.  TEE is usually not a painful procedure. You may feel the probe press against the back of the throat. The probe does not enter the trachea and does not affect your breathing. AFTER THE PROCEDURE   You will be in bed, resting, until you have fully returned to consciousness.  When you first awaken, your throat may feel slightly sore and will probably still feel numb. This will improve slowly over time.  You will not be allowed to eat or drink until it is clear that the numbness has improved.  Once you have been able to drink, urinate, and sit on the edge of the bed without feeling sick to your stomach (nausea) or dizzy, you may be cleared to go home.  You should have a friend or family member with you for the next 24 hours after your procedure. This information is not intended to replace advice given to you by your health care provider. Make sure you discuss any questions you have with your health care provider. Document Released: 12/02/2002 Document Revised: 09/16/2013 Document Reviewed: 03/13/2013 Elsevier Interactive Patient Education  2017 [employer].  Hospital doctor cardioversion is the delivery of a jolt of electricity to restore a normal rhythm to the heart. A rhythm that is too fast or is not regular keeps the heart from pumping well. In this procedure, sticky patches or metal paddles are placed on the chest to deliver electricity to the heart from a device. This procedure may be done in an emergency if:  There is low or no blood pressure as a result of the heart rhythm.  Normal rhythm must be restored as fast as possible to protect the brain and heart from further damage.  It may save a life. This procedure may also be done for irregular or fast heart rhythms that  are not immediately life-threatening. Tell a health care provider about:  Any allergies you have.  All medicines you are taking, including vitamins, herbs, eye drops, creams, and over-the-counter medicines.  Any problems you or family members have had with anesthetic medicines.  Any blood disorders you have.  Any surgeries you have had.  Any medical conditions you have.  Whether you are pregnant or may be pregnant. What are the risks? Generally, this is a safe procedure. However, problems may occur, including:  Allergic reactions to medicines.  A blood clot that breaks free and travels to other parts of your body.  The possible return of an abnormal heart rhythm within hours or days after the procedure.  Your heart stopping (cardiac arrest). This is rare. What happens before the procedure? Medicines  Your health care provider may have you start taking:  Blood-thinning medicines (anticoagulants) so your blood does not clot as easily.  Medicines may be given to help stabilize your heart rate and rhythm.  Ask your health care provider about changing or stopping your regular medicines. This  is especially important if you are taking diabetes medicines or blood thinners. General instructions  Plan to have someone take you home from the hospital or clinic.  If you will be going home right after the procedure, plan to have someone with you for 24 hours.  Follow instructions from your health care provider about eating or drinking restrictions. What happens during the procedure?  To lower your risk of infection:  Your health care team will wash or sanitize their hands.  Your skin will be washed with soap.  An IV tube will be inserted into one of your veins.  You will be given a medicine to help you relax (sedative).  Sticky patches (electrodes) or metal paddles may be placed on your chest.  An electrical shock will be delivered. The procedure may vary among health  care providers and hospitals. What happens after the procedure?  Your blood pressure, heart rate, breathing rate, and blood oxygen level will be monitored until the medicines you were given have worn off.  Do not drive for 24 hours if you were given a sedative.  Your heart rhythm will be watched to make sure it does not change. This information is not intended to replace advice given to you by your health care provider. Make sure you discuss any questions you have with your health care provider. Document Released: 09/01/2002 Document Revised: 05/10/2016 Document Reviewed: 03/17/2016 Elsevier Interactive Patient Education  2017 [employer].

## 2016-11-03 NOTE — Patient Outreach (Signed)
[facility]) Care Management  [place]  11/03/2016   [name] [name] 1931/10/02 [id number]  Subjective: Telephone call to patient for monthly call. Patient reports she went to the doctor Tuesday and was sent to the ED due to her not bouncing back from her flu bout.  She reports that she was diagnosed with a UTI and that she is on an antibiotic. She states she feels some better now.  She states she has a heart doctor appointment today.  Patient continues to weigh and her weight was 219 lbs today.  Discussed with patient signs of active heart failure and when to notify physician.  She verbalized understanding.    Objective:   Encounter Medications:  Outpatient Encounter Prescriptions as of 11/03/2016  Medication Sig Note  . acetaminophen (TYLENOL) 500 MG tablet Take 500 mg by mouth every 6 (six) hours as needed (pain).   Marland Kitchen albuterol (PROVENTIL) (2.5 MG/3ML) 0.083% nebulizer solution INHALE 1 VIAL VIA NEBULIZER THREE TIMES DAILY. (Patient taking differently: INHALE 1 VIAL VIA NEBULIZER TWO TIMES DAILY. )AS NEEDED))   . albuterol (VENTOLIN HFA) 108 (90 Base) MCG/ACT inhaler Inhale 2 puffs into the lungs every 6 (six) hours as needed for wheezing or shortness of breath.   . allopurinol (ZYLOPRIM) 100 MG tablet Take 1 tablet (100 mg total) by mouth daily.   Marland Kitchen azelastine (ASTELIN) 0.1 % nasal spray Place 2 sprays into both nostrils 2 (two) times daily. Use in each nostril as directed   . benzonatate (TESSALON) 100 MG capsule Take 1 capsule (100 mg total) by mouth 2 (two) times daily as needed for cough.   . calcitRIOL (ROCALTROL) 0.25 MCG capsule Take 1 capsule by mouth 3 (three) times a week.   . cephALEXin (KEFLEX) 500 MG capsule Take 1 capsule (500 mg total) by mouth 4 (four) times daily.   . cholecalciferol (VITAMIN D) 1000 UNITS tablet Take 1,000 Units by mouth daily.   Marland Kitchen COUMADIN 5 MG tablet Take 2 tablets daily (Patient taking differently: Take 10 mg by mouth every  evening. Take 2 tablets daily) 10/31/2016: BRAND ONLY  . digoxin (LANOXIN) 0.125 MG tablet Take 1 tablet (0.125 mg total) by mouth every other day. 10/31/2016: BRAND ONLY  . docusate sodium (COLACE) 100 MG capsule Take 300 mg by mouth at bedtime.   Marland Kitchen ezetimibe-simvastatin (VYTORIN) 10-20 MG tablet Take 1 tablet by mouth 3  times weekly (Patient taking differently: Take 1 tablet by mouth every morning. )   . fluticasone (FLONASE) 50 MCG/ACT nasal spray USE 2 SPRAYS IN EACH       NOSTRIL ONCE DAILY (Patient taking differently: USE 2 SPRAYS IN EACH       NOSTRIL ONCE DAILY AS NEEDED FOR CONGESTION)   . folic acid (FOLVITE) 1 MG tablet Take 1 tablet (1 mg total) by mouth daily.   . furosemide (LASIX) 40 MG tablet Take 1 tablet by mouth  daily   . gabapentin (NEURONTIN) 300 MG capsule Take 1 capsule (300 mg total) by mouth 2 (two) times daily. (Patient taking differently: Take 300 mg by mouth daily. )   . hydrALAZINE (APRESOLINE) 25 MG tablet Take 1 tablet (25 mg total) by mouth 3 (three) times daily.   Marland Kitchen HYDROcodone-acetaminophen (NORCO) 7.5-325 MG tablet Take 1 tablet by mouth every 6 (six) hours as needed for moderate pain (Must last 30 days.Do not drive or operate machinery while taking this medicine.).   Marland Kitchen isosorbide mononitrate (IMDUR) 60 MG 24 hr tablet take 1  tablet by mouth once daily   . losartan (COZAAR) 50 MG tablet Take 1 tablet by mouth  daily (Patient taking differently: Take 50 mg by mouth daily. )   . metolazone (ZAROXOLYN) 2.5 MG tablet Take 1 tablet (2.5 mg total) by mouth once a week.   . ondansetron (ZOFRAN) 4 MG tablet Take 1 tablet (4 mg total) by mouth daily as needed for nausea or vomiting.   [name] [name] (SYSTANE OP) Place 1 drop into both eyes daily as needed (dry eyes).   . potassium chloride SA (KLOR-CON M20) 20 MEQ tablet Take 1 tablet by mouth  daily   . pravastatin (PRAVACHOL) 20 MG tablet Take 1 tablet (20 mg total) by mouth daily.   .  promethazine-dextromethorphan (PROMETHAZINE-DM) 6.25-15 MG/5ML syrup One teaspoon at bedtime, as needed, for excessive cough (Patient taking differently: Take 5 mLs by mouth at bedtime as needed for cough. as needed, for excessive cough)   . SYNTHROID 75 MCG tablet Take 1 tablet by mouth  daily   . TOPROL XL 100 MG 24 hr tablet Take 1 tablet (100 mg total) by mouth daily. Take with or immediately following a meal.    Facility-Administered Encounter Medications as of 11/03/2016  Medication  . epoetin alfa (EPOGEN,PROCRIT) injection 40,000 Units    Functional Status:  In your present state of health, do you have any difficulty performing the following activities: 08/08/2016 04/24/2016  Hearing? N N  Vision? N N  Difficulty concentrating or making decisions? N Y  Walking or climbing stairs? N Y  Dressing or bathing? N Y  Doing errands, shopping? Tempie Donning  Preparing Food and eating ? N N  Using the Toilet? N N  In the past six months, have you accidently leaked urine? N N  Do you have problems with loss of bowel control? N N  Managing your Medications? N N  Managing your Finances? N N  Housekeeping or managing your Housekeeping? N N  Some recent data might be hidden    Fall/Depression Screening: PHQ 2/9 Scores 11/03/2016 10/13/2016 09/19/2016 08/08/2016 07/05/2016 04/24/2016 04/24/2016  PHQ - 2 Score 0 0 0 0 0 2 2  PHQ- 9 Score - - - - - 9 6    Assessment: Patient continues to benefit from health coach outreach for disease management and support.    Plan:  [facility] CM Care Plan Problem One   Flowsheet Row Most Recent Value  Care Plan Problem One  Heart Failure knowledge deficit  Role Documenting the Problem One  Health Coach  Care Plan for Problem One  Active  [facility] Long Term Goal (31-90 days)  Patient will become familiar with heart failure disease process and be able to verbalize heart failure zones for self management wtihin 90 days.    [facility]N Long Term Goal Start Date  11/03/16 [goal continued]   Interventions for Problem One Long Term Goal  RN Health Coach reiterated heart failure zones and when to notify physician.       RN Health Coach will contact patient in the month of March and patient agrees to next outreach.;  Jone Baseman, RN, MSN [place] [phone]

## 2016-11-04 LAB — TSH: TSH: 1.51 mIU/L

## 2016-11-04 LAB — APTT: aPTT: 34 s (ref 22–34)

## 2016-11-04 LAB — PROTIME-INR
INR: 1.7 — ABNORMAL HIGH
Prothrombin Time: 17.6 s — ABNORMAL HIGH (ref 9.0–11.5)

## 2016-11-06 ENCOUNTER — Other Ambulatory Visit: Payer: Self-pay

## 2016-11-06 ENCOUNTER — Other Ambulatory Visit: Payer: Self-pay | Admitting: Cardiovascular Disease

## 2016-11-06 ENCOUNTER — Encounter: Payer: Self-pay | Admitting: Cardiovascular Disease

## 2016-11-06 ENCOUNTER — Other Ambulatory Visit: Payer: Self-pay | Admitting: Family Medicine

## 2016-11-06 MED ORDER — POTASSIUM CHLORIDE CRYS ER 20 MEQ PO TBCR: EXTENDED_RELEASE_TABLET | ORAL | 0 refills | Status: DC

## 2016-11-06 MED ORDER — POTASSIUM CHLORIDE CRYS ER 20 MEQ PO TBCR: EXTENDED_RELEASE_TABLET | ORAL | 1 refills | Status: DC

## 2016-11-07 ENCOUNTER — Other Ambulatory Visit: Payer: Self-pay | Admitting: Family Medicine

## 2016-11-07 ENCOUNTER — Telehealth: Payer: Self-pay | Admitting: Cardiovascular Disease

## 2016-11-07 NOTE — Telephone Encounter (Signed)
Spoke to patient. She wanted to know why original cardioversion date was cancelled/changed. Looks like this was originally for the 13th, now set up for 19th. Pt aware of new procedure date. Advised I'm not sure why this was changed but could have been due to provider availability for slated days or other reason. Explained I would seek further information and return her call.

## 2016-11-07 NOTE — Telephone Encounter (Signed)
Returned call to patient.  Patient was only initially scheduled for a TEE. Patient needs a TEE Cardioversion. Endoscopy had no availability to add the cardioversion on for the 11/07/16. Entire procedure had to rescheduled to 11/13/16. Patient verbalized understanding and voiced appreciation for phone call. Advised patient to arrive at [facility] no later than 9a for her procedure on 11/13/16. Patient agreed with plan.

## 2016-11-07 NOTE — Telephone Encounter (Signed)
New message  Pt call requesting to speak with RN. She states she was to have a shock test on 2/12, but it was canceled. Pt would like to know why. Please call back to discuss

## 2016-11-08 LAB — CUP PACEART INCLINIC DEVICE CHECK
Date Time Interrogation Session: 20180214172107
Implantable Lead Implant Date: 20040521
Implantable Lead Implant Date: 20071218
Implantable Lead Implant Date: 20160516
Implantable Lead Location: 753858
Implantable Lead Location: 753859
Implantable Lead Location: 753860
Implantable Lead Model: 5076
Implantable Lead Model: 6947
Implantable Pulse Generator Implant Date: 20160516

## 2016-11-10 ENCOUNTER — Other Ambulatory Visit: Payer: Self-pay | Admitting: Cardiovascular Disease

## 2016-11-10 DIAGNOSIS — L851 Acquired keratosis [keratoderma] palmaris et plantaris: Secondary | ICD-10-CM | POA: Diagnosis not present

## 2016-11-10 DIAGNOSIS — E1142 Type 2 diabetes mellitus with diabetic polyneuropathy: Secondary | ICD-10-CM | POA: Diagnosis not present

## 2016-11-10 DIAGNOSIS — I4819 Other persistent atrial fibrillation: Secondary | ICD-10-CM

## 2016-11-10 DIAGNOSIS — L603 Nail dystrophy: Secondary | ICD-10-CM | POA: Diagnosis not present

## 2016-11-13 ENCOUNTER — Other Ambulatory Visit (HOSPITAL_COMMUNITY): Payer: Self-pay | Admitting: *Deleted

## 2016-11-13 ENCOUNTER — Encounter (HOSPITAL_COMMUNITY)
Admission: RE | Disposition: A | Discharge status: Home / Self Care | Payer: Self-pay | Source: Ambulatory Visit | Attending: Cardiovascular Disease

## 2016-11-13 ENCOUNTER — Ambulatory Visit (HOSPITAL_COMMUNITY): Payer: Medicare Other | Admitting: Certified Registered"

## 2016-11-13 ENCOUNTER — Ambulatory Visit (HOSPITAL_COMMUNITY)
Admission: RE | Admit: 2016-11-13 | Discharge: 2016-11-13 | Disposition: A | Discharge status: Home / Self Care | Payer: Medicare Other | Source: Ambulatory Visit | Attending: Cardiovascular Disease | Admitting: Cardiovascular Disease

## 2016-11-13 ENCOUNTER — Encounter (HOSPITAL_COMMUNITY): Payer: Self-pay

## 2016-11-13 ENCOUNTER — Ambulatory Visit (HOSPITAL_BASED_OUTPATIENT_CLINIC_OR_DEPARTMENT_OTHER): Payer: Medicare Other

## 2016-11-13 DIAGNOSIS — E1122 Type 2 diabetes mellitus with diabetic chronic kidney disease: Secondary | ICD-10-CM | POA: Insufficient documentation

## 2016-11-13 DIAGNOSIS — I130 Hypertensive heart and chronic kidney disease with heart failure and stage 1 through stage 4 chronic kidney disease, or unspecified chronic kidney disease: Secondary | ICD-10-CM | POA: Diagnosis not present

## 2016-11-13 DIAGNOSIS — Z95810 Presence of automatic (implantable) cardiac defibrillator: Secondary | ICD-10-CM | POA: Diagnosis not present

## 2016-11-13 DIAGNOSIS — Z7901 Long term (current) use of anticoagulants: Secondary | ICD-10-CM | POA: Diagnosis not present

## 2016-11-13 DIAGNOSIS — J449 Chronic obstructive pulmonary disease, unspecified: Secondary | ICD-10-CM | POA: Diagnosis not present

## 2016-11-13 DIAGNOSIS — Z880 Allergy status to penicillin: Secondary | ICD-10-CM | POA: Insufficient documentation

## 2016-11-13 DIAGNOSIS — E119 Type 2 diabetes mellitus without complications: Secondary | ICD-10-CM | POA: Diagnosis not present

## 2016-11-13 DIAGNOSIS — I513 Intracardiac thrombosis, not elsewhere classified: Secondary | ICD-10-CM | POA: Diagnosis not present

## 2016-11-13 DIAGNOSIS — E890 Postprocedural hypothyroidism: Secondary | ICD-10-CM | POA: Insufficient documentation

## 2016-11-13 DIAGNOSIS — Z9981 Dependence on supplemental oxygen: Secondary | ICD-10-CM | POA: Diagnosis not present

## 2016-11-13 DIAGNOSIS — Z792 Long term (current) use of antibiotics: Secondary | ICD-10-CM | POA: Diagnosis not present

## 2016-11-13 DIAGNOSIS — I5042 Chronic combined systolic (congestive) and diastolic (congestive) heart failure: Secondary | ICD-10-CM | POA: Diagnosis not present

## 2016-11-13 DIAGNOSIS — M109 Gout, unspecified: Secondary | ICD-10-CM | POA: Diagnosis not present

## 2016-11-13 DIAGNOSIS — E668 Other obesity: Secondary | ICD-10-CM | POA: Diagnosis not present

## 2016-11-13 DIAGNOSIS — I361 Nonrheumatic tricuspid (valve) insufficiency: Secondary | ICD-10-CM

## 2016-11-13 DIAGNOSIS — E785 Hyperlipidemia, unspecified: Secondary | ICD-10-CM | POA: Diagnosis not present

## 2016-11-13 DIAGNOSIS — Z87891 Personal history of nicotine dependence: Secondary | ICD-10-CM | POA: Insufficient documentation

## 2016-11-13 DIAGNOSIS — I4819 Other persistent atrial fibrillation: Secondary | ICD-10-CM

## 2016-11-13 DIAGNOSIS — I442 Atrioventricular block, complete: Secondary | ICD-10-CM

## 2016-11-13 DIAGNOSIS — I129 Hypertensive chronic kidney disease with stage 1 through stage 4 chronic kidney disease, or unspecified chronic kidney disease: Secondary | ICD-10-CM | POA: Diagnosis not present

## 2016-11-13 DIAGNOSIS — I5022 Chronic systolic (congestive) heart failure: Secondary | ICD-10-CM

## 2016-11-13 DIAGNOSIS — I4891 Unspecified atrial fibrillation: Secondary | ICD-10-CM

## 2016-11-13 DIAGNOSIS — Z952 Presence of prosthetic heart valve: Secondary | ICD-10-CM

## 2016-11-13 DIAGNOSIS — I429 Cardiomyopathy, unspecified: Secondary | ICD-10-CM | POA: Diagnosis not present

## 2016-11-13 DIAGNOSIS — Z79899 Other long term (current) drug therapy: Secondary | ICD-10-CM | POA: Insufficient documentation

## 2016-11-13 DIAGNOSIS — Z6834 Body mass index (BMI) 34.0-34.9, adult: Secondary | ICD-10-CM | POA: Insufficient documentation

## 2016-11-13 DIAGNOSIS — I481 Persistent atrial fibrillation: Secondary | ICD-10-CM | POA: Diagnosis not present

## 2016-11-13 DIAGNOSIS — N184 Chronic kidney disease, stage 4 (severe): Secondary | ICD-10-CM | POA: Diagnosis not present

## 2016-11-13 DIAGNOSIS — Z85850 Personal history of malignant neoplasm of thyroid: Secondary | ICD-10-CM | POA: Insufficient documentation

## 2016-11-13 DIAGNOSIS — E1151 Type 2 diabetes mellitus with diabetic peripheral angiopathy without gangrene: Secondary | ICD-10-CM | POA: Insufficient documentation

## 2016-11-13 HISTORY — PX: TEE WITHOUT CARDIOVERSION: SHX5443

## 2016-11-13 HISTORY — PX: CARDIOVERSION: SHX1299

## 2016-11-13 LAB — POCT I-STAT, CHEM 8
BUN: 50 mg/dL — ABNORMAL HIGH (ref 6–20)
Calcium, Ion: 1.21 mmol/L (ref 1.15–1.40)
Chloride: 101 mmol/L (ref 101–111)
Creatinine, Ser: 1.8 mg/dL — ABNORMAL HIGH (ref 0.44–1.00)
Glucose, Bld: 100 mg/dL — ABNORMAL HIGH (ref 65–99)
HCT: 30 % — ABNORMAL LOW (ref 36.0–46.0)
Hemoglobin: 10.2 g/dL — ABNORMAL LOW (ref 12.0–15.0)
Potassium: 4.6 mmol/L (ref 3.5–5.1)
Sodium: 138 mmol/L (ref 135–145)
TCO2: 28 mmol/L (ref 0–100)

## 2016-11-13 LAB — PROTIME-INR
INR: 2.28
Prothrombin Time: 25.5 seconds — ABNORMAL HIGH (ref 11.4–15.2)

## 2016-11-13 SURGERY — CARDIOVERSION
Anesthesia: General

## 2016-11-13 MED ORDER — BUTAMBEN-TETRACAINE-BENZOCAINE 2-2-14 % EX AERO
INHALATION_SPRAY | CUTANEOUS | Status: DC | PRN
  Administered 2016-11-13: 2 via TOPICAL

## 2016-11-13 MED ORDER — SODIUM CHLORIDE 0.9 % IV SOLN: INTRAVENOUS | Status: DC

## 2016-11-13 MED ORDER — PROPOFOL 500 MG/50ML IV EMUL
INTRAVENOUS | Status: DC | PRN
  Administered 2016-11-13: 100 ug/kg/min via INTRAVENOUS

## 2016-11-13 MED ORDER — PROPOFOL 10 MG/ML IV BOLUS
INTRAVENOUS | Status: DC | PRN
  Administered 2016-11-13: 40 mg via INTRAVENOUS

## 2016-11-13 MED ORDER — LIDOCAINE HCL (CARDIAC) 20 MG/ML IV SOLN
INTRAVENOUS | Status: DC | PRN
  Administered 2016-11-13: 100 mg via INTRATRACHEAL

## 2016-11-13 MED ORDER — LACTATED RINGERS IV SOLN
INTRAVENOUS | Status: DC | PRN
  Administered 2016-11-13: 10:00:00 via INTRAVENOUS

## 2016-11-13 NOTE — Anesthesia Postprocedure Evaluation (Signed)
Anesthesia Post Note  Patient: Scientific laboratory technician  Procedure(s) Performed: Procedure(s) (LRB): CARDIOVERSION (N/A) TRANSESOPHAGEAL ECHOCARDIOGRAM (TEE) (N/A)  Patient location during evaluation: PACU Anesthesia Type: General Level of consciousness: awake and alert Pain management: pain level controlled Vital Signs Assessment: post-procedure vital signs reviewed and stable Respiratory status: spontaneous breathing, nonlabored ventilation and respiratory function stable Cardiovascular status: blood pressure returned to baseline and stable Postop Assessment: no signs of nausea or vomiting Anesthetic complications: no       Last Vitals:  Vitals:   11/13/16 1120 11/13/16 1130  BP: (!) 164/78 (!) 171/72  Pulse: 70 70  Resp: (!) 27 (!) 21  Temp:      Last Pain:  Vitals:   11/13/16 1100  TempSrc: Oral                 Arius Harnois,W. EDMOND

## 2016-11-13 NOTE — Anesthesia Preprocedure Evaluation (Addendum)
Anesthesia Evaluation  Patient identified by MRN, date of birth, ID band Patient awake    Reviewed: Allergy & Precautions, H&P , NPO status , Patient's Chart, lab work & pertinent test results  Airway Mallampati: II  TM Distance: >3 FB Neck ROM: Full    Dental no notable dental hx. (+) Upper Dentures, Partial Lower, Dental Advisory Given   Pulmonary COPD,  COPD inhaler, former smoker,    Pulmonary exam normal breath sounds clear to auscultation       Cardiovascular hypertension, Pt. on medications and Pt. on home beta blockers + Peripheral Vascular Disease and +CHF  + dysrhythmias Atrial Fibrillation + Cardiac Defibrillator  Rhythm:Irregular Rate:Normal     Neuro/Psych negative neurological ROS  negative psych ROS   GI/Hepatic Neg liver ROS, GERD  Controlled,  Endo/Other  diabetesHypothyroidism   Renal/GU Renal InsufficiencyRenal disease  negative genitourinary   Musculoskeletal  (+) Arthritis , Osteoarthritis,    Abdominal   Peds  Hematology negative hematology ROS (+) anemia ,   Anesthesia Other Findings   Reproductive/Obstetrics negative OB ROS                            Anesthesia Physical Anesthesia Plan  ASA: III  Anesthesia Plan: General   Post-op Pain Management:    Induction: Intravenous  Airway Management Planned: Nasal Cannula  Additional Equipment:   Intra-op Plan:   Post-operative Plan:   Informed Consent: I have reviewed the patients History and Physical, chart, labs and discussed the procedure including the risks, benefits and alternatives for the proposed anesthesia with the patient or authorized representative who has indicated his/her understanding and acceptance.   Dental advisory given  Plan Discussed with: CRNA  Anesthesia Plan Comments:         Anesthesia Quick Evaluation

## 2016-11-13 NOTE — Op Note (Signed)
INDICATIONS: atrial fibrillation  PROCEDURE:   Informed consent was obtained prior to the procedure. The risks, benefits and alternatives for the procedure were discussed and the patient comprehended these risks.  Risks include, but are not limited to, cough, sore throat, vomiting, nausea, somnolence, esophageal and stomach trauma or perforation, bleeding, low blood pressure, aspiration, pneumonia, infection, trauma to the teeth and death.    After a procedural time-out, the oropharynx was anesthetized with 20% benzocaine spray.   During this procedure the patient was administered IV propofol by Anestesiology to achieve and maintain moderate conscious sedation.  The patient's heart rate, blood pressure, and oxygen saturation were monitored continuously during the procedure. The  The transesophageal probe was inserted in the esophagus and stomach without difficulty and multiple views were obtained.  The patient was kept under observation until the patient left the procedure room.  The patient left the procedure room in stable condition.   Agitated microbubble saline contrast was not administered.  COMPLICATIONS:    There were no immediate complications.  FINDINGS:  No LA appendage thrombus. Prominent left atrial thrombus. Normal MV prosthesis function. "physiological MR". Unable to enter stomach for transgastric views, so unable to measure AV gradients. No prosthetic AR. LVEF approximately 30-35%.    RECOMMENDATIONS:    Proceed with DCCV  Time Spent Directly with the Patient:  30 minutes   [name] [name] 11/13/2016, 10:53 AM

## 2016-11-13 NOTE — Interval H&P Note (Signed)
History and Physical Interval Note:  11/13/2016 10:09 AM  [name] [name]  has presented today for surgery, with the diagnosis of AFIB  The various methods of treatment have been discussed with the patient and family. After consideration of risks, benefits and other options for treatment, the patient has consented to  Procedure(s): CARDIOVERSION (N/A) TRANSESOPHAGEAL ECHOCARDIOGRAM (TEE) (N/A) as a surgical intervention .  The patient's history has been reviewed, patient examined, no change in status, stable for surgery.  I have reviewed the patient's chart and labs.  Questions were answered to the patient's satisfaction.     [name]

## 2016-11-13 NOTE — Progress Notes (Signed)
  Echocardiogram Echocardiogram Transesophageal has been performed.  [name] [name] 11/13/2016, 11:03 AM

## 2016-11-13 NOTE — H&P (View-Only) (Signed)
Cardiology Office Note    Date:  11/04/2016   ID:  Yzabelle Calles, DOB 21-Jun-1932, MRN [id number]  PCP:  [name], MD  Cardiologist:  Cristopher Peru, M.D. (EP/ICD); Sanda Klein, MD   Chief Complaint  Patient presents with  . Follow-up    History of Present Illness:  [name] [name] is a 83 y.o. female returning in follow-up for valvular heart disease, complete heart block and chronic combined systolic and diastolic heart failure.  She had "flu" in January (I am not sure if this was confirmed influenza or another viral illness). She received a prescription for azithromycin on January 10th. She was seen in the emergency room on January 15 with dyspnea, but was not found to be in overt heart failure. She received a single dose of intravenous furosemide as well as nebulized bronchodilators. She was discharged with prednisone. Her dyspnea has improved since then and remains better than it was before she had her CRT-D implanted. However she complains of substantial fatigue for several weeks. He was seen in the emergency room for weakness on February 6. She was prescribed cephalexin 500 mg 4 times daily for a "minor urinary infection".  Device interrogation shows that she has been in persistent atrial fibrillation since October 24. Ventricular rate control is not an issue due to heart block. Biventricular pacing is still present 98.6% of the time. Thoracic impedance has shown gradual fluid accumulation since mid January. No significant ventricular arrhythmia has been recorded. Lead parameters remain good.  She has not had any bleeding complications and usually her anticoagulation is excellent. Just today her INR was low at 1.8. It had not been below 2.0 since last August.  She has a compensated heart failure symptoms (NYHA functional class II) and does not have frequent problems with leg swelling. In the hot weather at the end of the day she may have mild edema that resolves by the next  morning. She denies chest pain, palpitations, syncope, focal neurological events, other embolic events, bleeding problems.  She saw Dr. Cristopher Peru on June 30 for a CRT-D device check. Device function was normal. No reprogramming was necessary.  She is pacemaker dependent due to complete heart block following aortic and mitral valve replacement with mechanical prostheses. (AVR 2001, MVR 2004, both [employer] prostheses, surgeries performed in [place]). She has severe cardiomyopathy with an estimated left ventricular ejection fraction of 25%, unchanged since 2004. She did not have any coronary artery disease by angiography before her first operation. In May 2016 her initial pacemaker lead was extracted and she received a CRT-D Medtronic Viva XT device by Dr. Cristopher Peru.  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  Her most recent hemoglobin on 03/30/2016 was 10.8 with normocytic normochromic indices. Her ferritin is quite high, not consistent with iron deficiency. She has chronic kidney disease and her most recent creatinine was 1.54 on 11/03/2016, with an estimated GFR of around 30 mL/minute. She has diabetes mellitus and hyperlipidemia and hyperuricemia. Hemoglobin A1c in September 2017 was 5.8%. In January 2018 her LDL cholesterol was 81, triglycerides moderately elevated at 265. Has COPD and uses oxygen at night only..   Past Medical History:  Diagnosis Date  . Adenomatous polyp of colon 12/16/2002   Dr. [name]/[facility]  . Allergy   . Cataract   .  CHB (complete heart block) ([place]) 10/07/2014      . CHF (congestive heart failure) ([place])       . Chronic kidney disease, stage I 2006  . Constipation       . COPD (chronic obstructive pulmonary disease) ([place])       . DJD (degenerative  joint disease) of lumbar spine   . DM (diabetes mellitus) ([place]) 2006  . Esophageal reflux   . Glaucoma   . Gout       . Heart murmur   . Hyperlipemia   . IDA (iron deficiency anemia)    Parenteral iron/[name]  . Insomnia       . Non-ischemic cardiomyopathy ([place])    a. s/p MDT CRTD  . Obesity, unspecified   . Other and unspecified hyperlipidemia   . Oxygen deficiency 2014   nocturnal;  . Spondylolisthesis   . Spondylosis   . Thyroid cancer ([place])    remote thyroidectomy, no recurrence, pt denies in 2016 thyroid cancer  . Unspecified hypothyroidism       . 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 Vertigo         Past Surgical History:  Procedure Laterality Date  . AORTIC AND MITRAL VALVE REPLACEMENT  2001  . BI-VENTRICULAR IMPLANTABLE CARDIOVERTER DEFIBRILLATOR UPGRADE N/A 01/18/2015   Procedure: BI-VENTRICULAR IMPLANTABLE CARDIOVERTER DEFIBRILLATOR UPGRADE;  Surgeon: Evans Lance, MD;  Location: [facility] CATH LAB;  Service: Cardiovascular;  Laterality: N/A;  . CARDIAC CATHETERIZATION    . CARDIAC VALVE REPLACEMENT    . COLONOSCOPY  06/12/2007   [place]- Normal rectum/Normal colon  . COLONOSCOPY  12/16/02   small hemorrhoids  . COLONOSCOPY  06/20/2012   Procedure: COLONOSCOPY;  Surgeon: Daneil Dolin, MD;  Location: [facility];  Service: Endoscopy;  Laterality: N/A;  10:45  . defibrillator implanted 2006    . DOPPLER ECHOCARDIOGRAPHY  2012  . DOPPLER ECHOCARDIOGRAPHY  05,06,07,08,09,2011  . ESOPHAGOGASTRODUODENOSCOPY  06/12/2007   [place]ourk- normal esophagus, small hiatal hernia, otherwise normal stomach, D1, D2  . EYE SURGERY Right 2005  . EYE SURGERY Left 2006  . ICD LEAD REMOVAL Left 02/08/2015   Procedure: ICD LEAD REMOVAL;  Surgeon: Evans Lance, MD;  Location: [facility] OR;  Service: Cardiovascular;  Laterality: Left;  "Will plan extraction and insertion of a BiV PM"  **Dr. Roxan Hockey backing up case**  . IMPLANTABLE CARDIOVERTER DEFIBRILLATOR  (ICD) GENERATOR CHANGE Left 02/08/2015   Procedure: ICD GENERATOR CHANGE;  Surgeon: Evans Lance, MD;  Location: [facility];  Service: Cardiovascular;  Laterality: Left;  . INSERT / REPLACE / REMOVE PACEMAKER    . PACEMAKER INSERTION  May 2016  . pacemaker placed  2004  . right breast cyst removed     benign   . right cataract removed     2005  . THYROIDECTOMY      Current Medications: Outpatient Medications Prior to Visit  Medication Sig Dispense Refill  . acetaminophen (TYLENOL) 500 MG tablet Take 500 mg by mouth every 6 (six) hours as needed (pain).    Marland Kitchen albuterol (PROVENTIL) (2.5 MG/3ML) 0.083% nebulizer solution INHALE 1 VIAL VIA NEBULIZER THREE TIMES DAILY. (Patient taking differently: INHALE 1 VIAL VIA NEBULIZER TWO TIMES DAILY. )AS NEEDED)) 300 mL 2  . albuterol (VENTOLIN HFA) 108 (90 Base) MCG/ACT inhaler Inhale 2 puffs into the lungs every 6 (six) hours as needed for wheezing or shortness of breath. 3 Inhaler 3  . allopurinol (ZYLOPRIM) 100 MG tablet Take  1 tablet (100 mg total) by mouth daily. 90 tablet 1  . azelastine (ASTELIN) 0.1 % nasal spray Place 2 sprays into both nostrils 2 (two) times daily. Use in each nostril as directed 30 mL 12  . benzonatate (TESSALON) 100 MG capsule Take 1 capsule (100 mg total) by mouth 2 (two) times daily as needed for cough. 30 capsule 0  . calcitRIOL (ROCALTROL) 0.25 MCG capsule Take 1 capsule by mouth 3 (three) times a week.  0  . cephALEXin (KEFLEX) 500 MG capsule Take 1 capsule (500 mg total) by mouth 4 (four) times daily. 28 capsule 0  . cholecalciferol (VITAMIN D) 1000 UNITS tablet Take 1,000 Units by mouth daily.    Marland Kitchen COUMADIN 5 MG tablet Take 2 tablets daily (Patient taking differently: Take 10 mg by mouth every evening. Take 2 tablets daily) 10 tablet 1  . digoxin (LANOXIN) 0.125 MG tablet Take 1 tablet (0.125 mg total) by mouth every other day. 90 tablet 3  . docusate sodium (COLACE) 100 MG capsule Take 300 mg by mouth at bedtime.    Marland Kitchen  ezetimibe-simvastatin (VYTORIN) 10-20 MG tablet Take 1 tablet by mouth 3  times weekly (Patient taking differently: Take 1 tablet by mouth every morning. ) 36 tablet 1  . fluticasone (FLONASE) 50 MCG/ACT nasal spray USE 2 SPRAYS IN EACH       NOSTRIL ONCE DAILY (Patient taking differently: USE 2 SPRAYS IN EACH       NOSTRIL ONCE DAILY AS NEEDED FOR CONGESTION) 48 g 1  . folic acid (FOLVITE) 1 MG tablet Take 1 tablet (1 mg total) by mouth daily. 100 tablet 3  . furosemide (LASIX) 40 MG tablet Take 1 tablet by mouth  daily 90 tablet 1  . gabapentin (NEURONTIN) 300 MG capsule Take 1 capsule (300 mg total) by mouth 2 (two) times daily. (Patient taking differently: Take 300 mg by mouth daily. ) 180 capsule 1  . hydrALAZINE (APRESOLINE) 25 MG tablet Take 1 tablet (25 mg total) by mouth 3 (three) times daily. 270 tablet 3  . HYDROcodone-acetaminophen (NORCO) 7.5-325 MG tablet Take 1 tablet by mouth every 6 (six) hours as needed for moderate pain (Must last 30 days.Do not drive or operate machinery while taking this medicine.). 110 tablet 0  . isosorbide mononitrate (IMDUR) 60 MG 24 hr tablet take 1 tablet by mouth once daily 90 tablet 1  . losartan (COZAAR) 50 MG tablet Take 1 tablet by mouth  daily (Patient taking differently: Take 50 mg by mouth daily. ) 90 tablet 1  . metolazone (ZAROXOLYN) 2.5 MG tablet Take 1 tablet (2.5 mg total) by mouth once a week. 12 tablet 2  . ondansetron (ZOFRAN) 4 MG tablet Take 1 tablet (4 mg total) by mouth daily as needed for nausea or vomiting. 90 tablet 0  . Polyethyl [name] (SYSTANE OP) Place 1 drop into both eyes daily as needed (dry eyes).    . potassium chloride SA (KLOR-CON M20) 20 MEQ tablet Take 1 tablet by mouth  daily 90 tablet 1  . pravastatin (PRAVACHOL) 20 MG tablet Take 1 tablet (20 mg total) by mouth daily. 90 tablet 1  . promethazine-dextromethorphan (PROMETHAZINE-DM) 6.25-15 MG/5ML syrup One teaspoon at bedtime, as needed, for excessive cough  (Patient taking differently: Take 5 mLs by mouth at bedtime as needed for cough. as needed, for excessive cough) 180 mL 0  . SYNTHROID 75 MCG tablet Take 1 tablet by mouth  daily 90 tablet 1  .  TOPROL XL 100 MG 24 hr tablet Take 1 tablet (100 mg total) by mouth daily. Take with or immediately following a meal. 90 tablet 1   Facility-Administered Medications Prior to Visit  Medication Dose Route Frequency Provider Last Rate Last Dose  . epoetin alfa (EPOGEN,PROCRIT) injection 40,000 Units  40,000 Units Subcutaneous Once Farrel Gobble, MD         Allergies:   Penicillins; Aspirin; Fentanyl; and Niacin   Social History   Social History  . Marital status: Widowed    Spouse name: N/A  . Number of children: 0  . Years of education: N/A   Occupational History  . retired Retired   Social History Main Topics  . Smoking status: Former Smoker    Packs/day: 0.75    Years: 40.00    Types: Cigarettes    Quit date: 03/08/1987  . Smokeless tobacco: Never Used  . Alcohol use No  . Drug use: No  . Sexual activity: Not Currently   Other Topics Concern  . None   Social History Narrative   From [place] (2004)     Family History:  The patient's family history includes Cancer in her brother and mother; Diabetes in her brother and brother; Heart disease in her brother, brother, father, and sister; Hypertension in her brother.   ROS:   Please see the history of present illness.    ROS All other systems reviewed and are negative.   PHYSICAL EXAM:   VS:  BP 138/78 (BP Location: Right Arm, Patient Position: Sitting, Cuff Size: Normal)   Pulse 79   Ht 5\' 7"  (1.702 m)   Wt 98.8 kg (217 lb 12.8 oz)   SpO2 95%   BMI 34.11 kg/m    GEN: Well nourished, well developed, in no acute distress  HEENT: normal  Neck: no JVD, carotid bruits, or masses Cardiac: Crisp mechanical prosthetic valve clicks RRR; 1/6 aortic ejection murmur is early peaking, no diastolic murmurs, rubs, or gallops,no edema   Respiratory:  clear to auscultation bilaterally, normal work of breathing GI: soft, nontender, nondistended, + BS MS: no deformity or atrophy  Skin: warm and dry, no rash Neuro:  Alert and Oriented x 3, Strength and sensation are intact Psych: euthymic mood, full affect  Wt Readings from Last 3 Encounters:  11/03/16 98.8 kg (217 lb 12.8 oz)  10/31/16 99.3 kg (219 lb)  10/19/16 97.5 kg (215 lb)      Studies/Labs Reviewed:   EKG:  EKG is ordered today.  The ekg ordered today demonstrates AV sequential pacing with occasional PVCs  Recent Labs: 10/09/2016: B Natriuretic Peptide 232.0 10/16/2016: ALT 22 11/03/2016: BUN 29; Creat 1.54; Hemoglobin 10.6; Platelets 173; Potassium 4.4; Sodium 137; TSH 1.51   Lipid Panel    Component Value Date/Time   CHOL 165 10/16/2016 1213   TRIG 265 (H) 10/16/2016 1213   HDL 31 (L) 10/16/2016 1213   CHOLHDL 5.1 10/14/2015 1130   VLDL 40 10/14/2015 1130   LDLCALC 81 10/16/2016 1213     ASSESSMENT:    1. Persistent atrial fibrillation ([place])   2. Chronic systolic heart failure ([place])   3. CHB (complete heart block) ([facility])   4. Biventricular ICD (implantable cardioverter-defibrillator) in place   5. S/P MVR (mitral valve replacement)   6. S/P AVR (aortic valve replacement)   7. Chronic anticoagulation, on coumadin for Mechanical AVR and MVR   8. Other non-autoimmune hemolytic anemias ([place])   9. Essential hypertension   10. CKD (chronic kidney  disease), stage IV ([place])   11. Simple chronic bronchitis ([place])   12. Type 2 diabetes mellitus with chronic kidney disease, without long-term current use of insulin, unspecified CKD stage ([place])   13. Mild obesity      PLAN:  In order of problems listed above:  1. AFib: Her current symptom decompensation might be secondary to atrial fibrillation, which has now been persistent for a couple of months. Would like to see if symptoms improve after cardioversion. Unfortunately her anticoagulation which is  usually excellent is now slightly subpar. Will have to do a TEE guided cardioversion. The TEE will enable [place] to get a closer look at her prosthetic valves and make sure that these are also functioning normally. The risks and benefits of TEE, cardioversion, sedation with anesthesiology were all discussed in detail.  2. CHF: She appears well compensated, euvolemic/NYHA functional class II on a very low dose of loop diuretic as well as a low dose of metolazone once a week. Her weight is actually a few pounds lower than it was when I saw her last July. Continue current management. Reminded about the importance of daily weights and sodium restriction. She had a modest improvement in left ventricular ejection fraction following initiation of CRT. 3. CHB: Pacemaker dependent. 4. CRT-D: device checked today with normal findings except for the presence of persistent atrial fibrillation and Optivol evidence of early fluid overload  5. Mechanical mitral valve prosthesis: INR anticoagulation target 2.5-3.5  6. Mechanical aortic valve prosthesis 7. Warfarin: Until today, the last time her INR was subtherapeutic was August 2017. Dose adjusted today. 8. 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 Myoglobin is consistently around 10.6; note suppressed haptoglobin in the past and normal iron stores  9. HTN: Well controlled usually, slightly high today. No change in medications. She is on hydralazine/nitrates, ARB, beta blocker (metoprolol preferred to carvedilol due to reactive airway disease). 10. CKD: Stable and improved from earlier in the year 52. COPD/asthma: Seems to be well compensated. Her wheezing today. Uses supplemental oxygen at night only.  12. DM: Well controlled 13. Obesity: Weight loss recommended    Medication Adjustments/Labs and Tests Ordered: Current medicines are reviewed at length with the patient today.  Concerns regarding  medicines are outlined above.  Medication changes, Labs and Tests ordered today are listed in the Patient Instructions below. Patient Instructions  Your physician has requested that you have a TEE/Cardioversion Tuesday, February 13th, 2018 at 11:00a with Dr Sallyanne Kuster. During a TEE, sound waves are used to create images of your heart. It provides your doctor with information about the size and shape of your heart and how well your heart's chambers and valves are working. In this test, a transducer is attached to the end of a flexible tube that is guided down you throat and into your esophagus (the tube leading from your mouth to your stomach) to get a more detailed image of your heart. Once the TEE has determined that a blood clot is not present, the cardioversion begins. Electrical Cardioversion uses a jolt of electricity to your heart either through paddles or wired patches attached to your chest. This is a controlled, usually prescheduled, procedure. This procedure is done at the hospital and you are not awake during the procedure. You usually go home the day of the procedure. Please see the instruction sheet given to you today for more information.  Your physician recommends that you return for lab work TODAY.  Transesophageal Echocardiogram Transesophageal echocardiography (TEE) is a special type of test that produces images of the heart by using sound waves (echocardiogram). This type of echocardiography can obtain better images of the heart than standard echocardiography. TEE is done by passing a flexible tube down the esophagus. The heart is located in front of the esophagus. Because the heart and esophagus are close to one another, your health care provider can take very clear, detailed pictures of the heart via ultrasound waves. TEE may be done:  If your health care provider needs more information based on standard echocardiography findings.  If you had a stroke. This might have happened  because a clot formed in your heart. TEE can visualize different areas of the heart and check for clots.  To check valve anatomy and function.  To check for infection on the inside of your heart (endocarditis).  To evaluate the dividing wall (septum) of the heart and presence of a hole that did not close after birth (patent foramen ovale or atrial septal defect).  To help diagnose a tear in the wall of the aorta (aortic dissection).  During cardiac valve surgery. This allows the surgeon to assess the valve repair before closing the chest.  During a variety of other cardiac procedures to guide positioning of catheters.  Sometimes before a cardioversion, which is a shock to convert heart rhythm back to normal. LET [facility] CARE PROVIDER KNOW ABOUT:   Any allergies you have.  All medicines you are taking, including vitamins, herbs, eye drops, creams, and over-the-counter medicines.  Previous problems you or members of your family have had with the use of anesthetics.  Any blood disorders you have.  Previous surgeries you have had.  Medical conditions you have.  Swallowing difficulties.  An esophageal obstruction. RISKS AND COMPLICATIONS  Generally, TEE is a safe procedure. However, as with any procedure, complications can occur. Possible complications include an esophageal tear (rupture). BEFORE THE PROCEDURE   Do not eat or drink for 6 hours before the procedure or as directed by your health care provider.  Arrange for someone to drive you home after the procedure. Do not drive yourself home. During the procedure, you will be given medicines that can continue to make you feel drowsy and can impair your reflexes.  An IV access tube will be started in the arm. PROCEDURE   A medicine to help you relax (sedative) will be given through the IV access tube.  A medicine may be sprayed or gargled to numb the back of the throat.  Your blood pressure, heart rate, and breathing  (vital signs) will be monitored during the procedure.  The TEE probe is a long, flexible tube. The tip of the probe is placed into the back of the mouth, and you will be asked to swallow. This helps to pass the tip of the probe into the esophagus. Once the tip of the probe is in the correct area, your health care provider can take pictures of the heart.  TEE is usually not a painful procedure. You may feel the probe press against the back of the throat. The probe does not enter the trachea and does not affect your breathing. AFTER THE PROCEDURE   You will be in bed, resting, until you have fully returned to consciousness.  When you first awaken, your throat may feel slightly sore and will probably still feel numb. This will improve slowly over time.  You will not be allowed to eat or drink until  it is clear that the numbness has improved.  Once you have been able to drink, urinate, and sit on the edge of the bed without feeling sick to your stomach (nausea) or dizzy, you may be cleared to go home.  You should have a friend or family member with you for the next 24 hours after your procedure. This information is not intended to replace advice given to you by your health care provider. Make sure you discuss any questions you have with your health care provider. Document Released: 12/02/2002 Document Revised: 09/16/2013 Document Reviewed: 03/13/2013 Elsevier Interactive Patient Education  2017 [employer].  Hospital doctor cardioversion is the delivery of a jolt of electricity to restore a normal rhythm to the heart. A rhythm that is too fast or is not regular keeps the heart from pumping well. In this procedure, sticky patches or metal paddles are placed on the chest to deliver electricity to the heart from a device. This procedure may be done in an emergency if:  There is low or no blood pressure as a result of the heart rhythm.  Normal rhythm must be restored as fast  as possible to protect the brain and heart from further damage.  It may save a life. This procedure may also be done for irregular or fast heart rhythms that are not immediately life-threatening. Tell a health care provider about:  Any allergies you have.  All medicines you are taking, including vitamins, herbs, eye drops, creams, and over-the-counter medicines.  Any problems you or family members have had with anesthetic medicines.  Any blood disorders you have.  Any surgeries you have had.  Any medical conditions you have.  Whether you are pregnant or may be pregnant. What are the risks? Generally, this is a safe procedure. However, problems may occur, including:  Allergic reactions to medicines.  A blood clot that breaks free and travels to other parts of your body.  The possible return of an abnormal heart rhythm within hours or days after the procedure.  Your heart stopping (cardiac arrest). This is rare. What happens before the procedure? Medicines  Your health care provider may have you start taking:  Blood-thinning medicines (anticoagulants) so your blood does not clot as easily.  Medicines may be given to help stabilize your heart rate and rhythm.  Ask your health care provider about changing or stopping your regular medicines. This is especially important if you are taking diabetes medicines or blood thinners. General instructions  Plan to have someone take you home from the hospital or clinic.  If you will be going home right after the procedure, plan to have someone with you for 24 hours.  Follow instructions from your health care provider about eating or drinking restrictions. What happens during the procedure?  To lower your risk of infection:  Your health care team will wash or sanitize their hands.  Your skin will be washed with soap.  An IV tube will be inserted into one of your veins.  You will be given a medicine to help you relax  (sedative).  Sticky patches (electrodes) or metal paddles may be placed on your chest.  An electrical shock will be delivered. The procedure may vary among health care providers and hospitals. What happens after the procedure?  Your blood pressure, heart rate, breathing rate, and blood oxygen level will be monitored until the medicines you were given have worn off.  Do not drive for 24 hours if you were given a sedative.  Your heart rhythm will be watched to make sure it does not change. This information is not intended to replace advice given to you by your health care provider. Make sure you discuss any questions you have with your health care provider. Document Released: 09/01/2002 Document Revised: 05/10/2016 Document Reviewed: 03/17/2016 Elsevier Interactive Patient Education  [address] [place], Sanda Klein, MD  11/04/2016 9:46 AM    [facility] [place], [place], [place]  [address] Phone: [phone]; Fax: [phone]

## 2016-11-13 NOTE — Op Note (Signed)
Procedure: Electrical Cardioversion Indications:  Atrial Fibrillation  Procedure Details:  Consent: Risks of procedure as well as the alternatives and risks of each were explained to the (patient/caregiver).  Consent for procedure obtained.  Time Out: Verified patient identification, verified procedure, site/side was marked, verified correct patient position, special equipment/implants available, medications/allergies/relevent history reviewed, required imaging and test results available.  Performed  Patient placed on cardiac monitor, pulse oximetry, supplemental oxygen as necessary.  Sedation given: IV propofol Pacer pads placed anterior and posterior chest.  Cardioverted 1 time(s).  Cardioversion with synchronized biphasic 150J shock.  Evaluation: Findings: Post procedure device check shows: A paced BiV paced rhythm. Normal CRT-D device function. Better LV pacing threshold found via LV2-RV coil vector, so changed pacing to this configuration. So far no complaints of diaphragmatic stimulation. Complications: None Patient did tolerate procedure well.  Time Spent Directly with the Patient:  30 minutes   [name] [name] 11/13/2016, 10:59 AM

## 2016-11-13 NOTE — Progress Notes (Signed)
Patient did not take blood pressure medications this morning as she thought she needed to be NPO. Blood pressure did increase in the recovery area following procedure; 171/72. Patient states that it can go up if she doesn't take her medications. Patient and niece state that she will take her pills as soon as she gets home.

## 2016-11-13 NOTE — Transfer of Care (Signed)
Immediate Anesthesia Transfer of Care Note  Patient: [name] [name]  Procedure(s) Performed: Procedure(s): CARDIOVERSION (N/A) TRANSESOPHAGEAL ECHOCARDIOGRAM (TEE) (N/A)  Patient Location: PACU  Anesthesia Type:MAC  Level of Consciousness: awake, alert  and patient cooperative  Airway & Oxygen Therapy: Patient Spontanous Breathing and Patient connected to nasal cannula oxygen  Post-op Assessment: Report given to RN and Post -op Vital signs reviewed and stable  Post vital signs: Reviewed and stable  Last Vitals:  Vitals:   11/13/16 0920 11/13/16 1100  BP: (!) 132/59 138/62  Pulse: 70 71  Resp: (!) 25 18  Temp:  36.6 C    Last Pain:  Vitals:   11/13/16 1100  TempSrc: Oral         Complications: No apparent anesthesia complications

## 2016-11-13 NOTE — Discharge Instructions (Signed)
TEE ° °YOU HAD AN CARDIAC PROCEDURE TODAY: Refer to the procedure report and other information in the discharge instructions given to you for any specific questions about what was found during the examination. If this information does not answer your questions, please call [facility] office at [phone] to clarify.  ° °DIET: Your first meal following the procedure should be a light meal and then it is ok to progress to your normal diet. A half-sandwich or bowl of soup is an example of a good first meal. Heavy or fried foods are harder to digest and may make you feel nauseous or bloated. Drink plenty of fluids but you should avoid alcoholic beverages for 24 hours. If you had a esophageal dilation, please see attached instructions for diet.  ° °ACTIVITY: Your care partner should take you home directly after the procedure. You should plan to take it easy, moving slowly for the rest of the day. You can resume normal activity the day after the procedure however YOU SHOULD NOT DRIVE, use power tools, machinery or perform tasks that involve climbing or major physical exertion for 24 hours (because of the sedation medicines used during the test).  ° °SYMPTOMS TO REPORT IMMEDIATELY: °A cardiologist can be reached at any hour. Please call [phone] for any of the following symptoms:  °Vomiting of blood or coffee ground material  °New, significant abdominal pain  °New, significant chest pain or pain under the shoulder blades  °Painful or persistently difficult swallowing  °New shortness of breath  °Black, tarry-looking or red, bloody stools ° °FOLLOW UP:  °Please also call with any specific questions about appointments or follow up tests. ° ° °Electrical Cardioversion, Care After °This sheet gives you information about how to care for yourself after your procedure. Your health care provider may also give you more specific instructions. If you have problems or questions, contact your health care provider. °What can I  expect after the procedure? °After the procedure, it is common to have: °· Some redness on the skin where the shocks were given. °Follow these instructions at home: °· Do not drive for 24 hours if you were given a medicine to help you relax (sedative). °· Take over-the-counter and prescription medicines only as told by your health care provider. °· Ask your health care provider how to check your pulse. Check it often. °· Rest for 48 hours after the procedure or as told by your health care provider. °· Avoid or limit your caffeine use as told by your health care provider. °Contact a health care provider if: °· You feel like your heart is beating too quickly or your pulse is not regular. °· You have a serious muscle cramp that does not go away. °Get help right away if: °· You have discomfort in your chest. °· You are dizzy or you feel faint. °· You have trouble breathing or you are short of breath. °· Your speech is slurred. °· You have trouble moving an arm or leg on one side of your body. °· Your fingers or toes turn cold or blue. °This information is not intended to replace advice given to you by your health care provider. Make sure you discuss any questions you have with your health care provider. °Document Released: 07/02/2013 Document Revised: 04/14/2016 Document Reviewed: 03/17/2016 °[employer]vier Interactive Patient Education © 2017 [employer] ° °

## 2016-11-13 NOTE — Anesthesia Procedure Notes (Signed)
Procedure Name: MAC Date/Time: 11/13/2016 10:31 AM Performed by: Lance Coon Pre-anesthesia Checklist: Patient identified, Emergency Drugs available, Suction available, Timeout performed and Patient being monitored Patient Re-evaluated:Patient Re-evaluated prior to inductionOxygen Delivery Method: Nasal cannula

## 2016-11-15 ENCOUNTER — Telehealth: Payer: Self-pay | Admitting: Cardiovascular Disease

## 2016-11-15 NOTE — Telephone Encounter (Signed)
Closed Encounter  °

## 2016-11-16 ENCOUNTER — Telehealth: Payer: Self-pay | Admitting: Cardiovascular Disease

## 2016-11-16 ENCOUNTER — Emergency Department (HOSPITAL_COMMUNITY): Payer: Medicare Other

## 2016-11-16 ENCOUNTER — Ambulatory Visit (INDEPENDENT_AMBULATORY_CARE_PROVIDER_SITE_OTHER): Payer: Medicare Other

## 2016-11-16 ENCOUNTER — Encounter (HOSPITAL_COMMUNITY): Payer: Self-pay | Admitting: Emergency Medicine

## 2016-11-16 ENCOUNTER — Other Ambulatory Visit: Payer: Self-pay

## 2016-11-16 ENCOUNTER — Observation Stay (HOSPITAL_COMMUNITY)
Admission: EM | Admit: 2016-11-16 | Discharge: 2016-11-17 | Disposition: A | Discharge status: Home / Self Care | Payer: Medicare Other | Attending: Internal Medicine | Admitting: Internal Medicine

## 2016-11-16 ENCOUNTER — Telehealth: Payer: Self-pay

## 2016-11-16 VITALS — BP 126/60 | HR 70 | Wt 217.0 lb

## 2016-11-16 DIAGNOSIS — I130 Hypertensive heart and chronic kidney disease with heart failure and stage 1 through stage 4 chronic kidney disease, or unspecified chronic kidney disease: Principal | ICD-10-CM | POA: Insufficient documentation

## 2016-11-16 DIAGNOSIS — N184 Chronic kidney disease, stage 4 (severe): Secondary | ICD-10-CM | POA: Diagnosis present

## 2016-11-16 DIAGNOSIS — I5043 Acute on chronic combined systolic (congestive) and diastolic (congestive) heart failure: Secondary | ICD-10-CM | POA: Diagnosis present

## 2016-11-16 DIAGNOSIS — J410 Simple chronic bronchitis: Secondary | ICD-10-CM

## 2016-11-16 DIAGNOSIS — J449 Chronic obstructive pulmonary disease, unspecified: Secondary | ICD-10-CM | POA: Diagnosis present

## 2016-11-16 DIAGNOSIS — R002 Palpitations: Secondary | ICD-10-CM | POA: Diagnosis not present

## 2016-11-16 DIAGNOSIS — R0602 Shortness of breath: Secondary | ICD-10-CM

## 2016-11-16 DIAGNOSIS — I10 Essential (primary) hypertension: Secondary | ICD-10-CM | POA: Diagnosis not present

## 2016-11-16 DIAGNOSIS — Z79899 Other long term (current) drug therapy: Secondary | ICD-10-CM | POA: Diagnosis not present

## 2016-11-16 DIAGNOSIS — I5023 Acute on chronic systolic (congestive) heart failure: Secondary | ICD-10-CM | POA: Diagnosis present

## 2016-11-16 DIAGNOSIS — I509 Heart failure, unspecified: Secondary | ICD-10-CM | POA: Insufficient documentation

## 2016-11-16 DIAGNOSIS — Z87891 Personal history of nicotine dependence: Secondary | ICD-10-CM | POA: Insufficient documentation

## 2016-11-16 DIAGNOSIS — I428 Other cardiomyopathies: Secondary | ICD-10-CM

## 2016-11-16 DIAGNOSIS — R7303 Prediabetes: Secondary | ICD-10-CM

## 2016-11-16 DIAGNOSIS — J209 Acute bronchitis, unspecified: Secondary | ICD-10-CM | POA: Diagnosis present

## 2016-11-16 DIAGNOSIS — Z7901 Long term (current) use of anticoagulants: Secondary | ICD-10-CM

## 2016-11-16 DIAGNOSIS — Z952 Presence of prosthetic heart valve: Secondary | ICD-10-CM | POA: Diagnosis not present

## 2016-11-16 LAB — CBC WITH DIFFERENTIAL/PLATELET
Basophils Absolute: 0 10*3/uL (ref 0.0–0.1)
Basophils Relative: 0 %
Eosinophils Absolute: 0 10*3/uL (ref 0.0–0.7)
Eosinophils Relative: 0 %
HCT: 31.2 % — ABNORMAL LOW (ref 36.0–46.0)
Hemoglobin: 10.2 g/dL — ABNORMAL LOW (ref 12.0–15.0)
Lymphocytes Relative: 18 %
Lymphs Abs: 1.2 10*3/uL (ref 0.7–4.0)
MCH: 30.6 pg (ref 26.0–34.0)
MCHC: 32.7 g/dL (ref 30.0–36.0)
MCV: 93.7 fL (ref 78.0–100.0)
Monocytes Absolute: 0.5 10*3/uL (ref 0.1–1.0)
Monocytes Relative: 8 %
Neutro Abs: 5 10*3/uL (ref 1.7–7.7)
Neutrophils Relative %: 74 %
Platelets: 180 10*3/uL (ref 150–400)
RBC: 3.33 MIL/uL — ABNORMAL LOW (ref 3.87–5.11)
RDW: 16.1 % — ABNORMAL HIGH (ref 11.5–15.5)
WBC: 6.8 10*3/uL (ref 4.0–10.5)

## 2016-11-16 LAB — BRAIN NATRIURETIC PEPTIDE: B Natriuretic Peptide: 290 pg/mL — ABNORMAL HIGH (ref 0.0–100.0)

## 2016-11-16 LAB — PROTIME-INR
INR: 2.38
Prothrombin Time: 26.4 seconds — ABNORMAL HIGH (ref 11.4–15.2)

## 2016-11-16 LAB — COMPREHENSIVE METABOLIC PANEL
ALT: 17 U/L (ref 14–54)
AST: 27 U/L (ref 15–41)
Albumin: 3.9 g/dL (ref 3.5–5.0)
Alkaline Phosphatase: 59 U/L (ref 38–126)
Anion gap: 7 (ref 5–15)
BUN: 49 mg/dL — ABNORMAL HIGH (ref 6–20)
CO2: 30 mmol/L (ref 22–32)
Calcium: 9.3 mg/dL (ref 8.9–10.3)
Chloride: 99 mmol/L — ABNORMAL LOW (ref 101–111)
Creatinine, Ser: 1.61 mg/dL — ABNORMAL HIGH (ref 0.44–1.00)
GFR calc Af Amer: 33 mL/min — ABNORMAL LOW (ref >60)
GFR calc non Af Amer: 29 mL/min — ABNORMAL LOW (ref >60)
Glucose, Bld: 104 mg/dL — ABNORMAL HIGH (ref 65–99)
Potassium: 4.5 mmol/L (ref 3.5–5.1)
Sodium: 136 mmol/L (ref 135–145)
Total Bilirubin: 0.3 mg/dL (ref 0.3–1.2)
Total Protein: 7.3 g/dL (ref 6.5–8.1)

## 2016-11-16 LAB — TROPONIN I
Troponin I: 0.04 ng/mL (ref <0.03)
Troponin I: 0.04 ng/mL (ref <0.03)

## 2016-11-16 LAB — DIGOXIN LEVEL: Digoxin Level: 0.3 ng/mL — ABNORMAL LOW (ref 0.8–2.0)

## 2016-11-16 LAB — D-DIMER, QUANTITATIVE: D-Dimer, Quant: 0.82 ug/mL-FEU — ABNORMAL HIGH (ref 0.00–0.50)

## 2016-11-16 MED ORDER — ALLOPURINOL 100 MG PO TABS
100.0000 mg | ORAL_TABLET | Freq: Every day | ORAL | Status: DC
  Administered 2016-11-17: 100 mg via ORAL
  Filled 2016-11-16: qty 1

## 2016-11-16 MED ORDER — FOLIC ACID 1 MG PO TABS
1.0000 mg | ORAL_TABLET | Freq: Every day | ORAL | Status: DC
  Administered 2016-11-17: 1 mg via ORAL
  Filled 2016-11-16: qty 1

## 2016-11-16 MED ORDER — DOCUSATE SODIUM 100 MG PO CAPS
300.0000 mg | ORAL_CAPSULE | Freq: Every day | ORAL | Status: DC
  Administered 2016-11-16: 300 mg via ORAL
  Filled 2016-11-16: qty 3

## 2016-11-16 MED ORDER — BENZONATATE 100 MG PO CAPS: 100.0000 mg | ORAL_CAPSULE | Freq: Two times a day (BID) | ORAL | Status: DC | PRN

## 2016-11-16 MED ORDER — ISOSORBIDE MONONITRATE ER 60 MG PO TB24
60.0000 mg | ORAL_TABLET | Freq: Every day | ORAL | Status: DC
  Administered 2016-11-17: 60 mg via ORAL
  Filled 2016-11-16: qty 1

## 2016-11-16 MED ORDER — SODIUM CHLORIDE 0.9 % IV SOLN: 250.0000 mL | INTRAVENOUS | Status: DC | PRN

## 2016-11-16 MED ORDER — WARFARIN SODIUM 5 MG PO TABS
10.0000 mg | ORAL_TABLET | Freq: Once | ORAL | Status: AC
  Administered 2016-11-16: 10 mg via ORAL
  Filled 2016-11-16: qty 2

## 2016-11-16 MED ORDER — FUROSEMIDE 10 MG/ML IJ SOLN
40.0000 mg | Freq: Once | INTRAMUSCULAR | Status: AC
  Administered 2016-11-16: 40 mg via INTRAVENOUS
  Filled 2016-11-16: qty 4

## 2016-11-16 MED ORDER — CALCITRIOL 0.25 MCG PO CAPS
0.2500 ug | ORAL_CAPSULE | ORAL | Status: DC
  Administered 2016-11-17: 0.25 ug via ORAL
  Filled 2016-11-16: qty 1

## 2016-11-16 MED ORDER — HYDROCODONE-ACETAMINOPHEN 7.5-325 MG PO TABS
1.0000 | ORAL_TABLET | Freq: Four times a day (QID) | ORAL | Status: DC | PRN
  Administered 2016-11-17: 1 via ORAL
  Filled 2016-11-16: qty 1

## 2016-11-16 MED ORDER — FUROSEMIDE 10 MG/ML IJ SOLN
40.0000 mg | Freq: Two times a day (BID) | INTRAMUSCULAR | Status: DC
  Administered 2016-11-17: 40 mg via INTRAVENOUS
  Filled 2016-11-16: qty 4

## 2016-11-16 MED ORDER — METOPROLOL SUCCINATE ER 50 MG PO TB24
100.0000 mg | ORAL_TABLET | Freq: Every day | ORAL | Status: DC
Start: 2016-11-17 — End: 2016-11-17
  Administered 2016-11-17: 100 mg via ORAL
  Filled 2016-11-16: qty 2

## 2016-11-16 MED ORDER — METOLAZONE 5 MG PO TABS: 2.5000 mg | ORAL_TABLET | ORAL | Status: DC

## 2016-11-16 MED ORDER — ONDANSETRON HCL 4 MG/2ML IJ SOLN
4.0000 mg | Freq: Four times a day (QID) | INTRAMUSCULAR | Status: DC | PRN
  Administered 2016-11-17: 4 mg via INTRAVENOUS
  Filled 2016-11-16: qty 2

## 2016-11-16 MED ORDER — ACETAMINOPHEN 325 MG PO TABS: 650.0000 mg | ORAL_TABLET | ORAL | Status: DC | PRN

## 2016-11-16 MED ORDER — ONDANSETRON HCL 4 MG PO TABS: 4.0000 mg | ORAL_TABLET | Freq: Every day | ORAL | Status: DC | PRN

## 2016-11-16 MED ORDER — WARFARIN - PHARMACIST DOSING INPATIENT: Freq: Every day | Status: DC

## 2016-11-16 MED ORDER — AZELASTINE HCL 0.1 % NA SOLN
2.0000 | Freq: Two times a day (BID) | NASAL | Status: DC
  Administered 2016-11-16 – 2016-11-17 (×2): 2 via NASAL
  Filled 2016-11-16: qty 30

## 2016-11-16 MED ORDER — PRAVASTATIN SODIUM 10 MG PO TABS: 20.0000 mg | ORAL_TABLET | Freq: Every day | ORAL | Status: DC

## 2016-11-16 MED ORDER — SODIUM CHLORIDE 0.9% FLUSH: 3.0000 mL | INTRAVENOUS | Status: DC | PRN

## 2016-11-16 MED ORDER — INFLUENZA VAC SPLIT QUAD 0.5 ML IM SUSY
0.5000 mL | PREFILLED_SYRINGE | INTRAMUSCULAR | Status: AC
  Administered 2016-11-17: 0.5 mL via INTRAMUSCULAR
  Filled 2016-11-16: qty 0.5

## 2016-11-16 MED ORDER — ALBUTEROL SULFATE (2.5 MG/3ML) 0.083% IN NEBU: 3.0000 mL | INHALATION_SOLUTION | Freq: Four times a day (QID) | RESPIRATORY_TRACT | Status: DC | PRN

## 2016-11-16 MED ORDER — ACETAMINOPHEN 500 MG PO TABS: 500.0000 mg | ORAL_TABLET | Freq: Four times a day (QID) | ORAL | Status: DC | PRN

## 2016-11-16 MED ORDER — IOPAMIDOL (ISOVUE-370) INJECTION 76%
50.0000 mL | Freq: Once | INTRAVENOUS | Status: AC | PRN
  Administered 2016-11-16: 50 mL via INTRAVENOUS

## 2016-11-16 MED ORDER — VITAMIN D 1000 UNITS PO TABS
1000.0000 [IU] | ORAL_TABLET | Freq: Every day | ORAL | Status: DC
  Administered 2016-11-17: 1000 [IU] via ORAL
  Filled 2016-11-16: qty 1

## 2016-11-16 MED ORDER — HYDRALAZINE HCL 25 MG PO TABS
25.0000 mg | ORAL_TABLET | Freq: Three times a day (TID) | ORAL | Status: DC
  Administered 2016-11-16 – 2016-11-17 (×2): 25 mg via ORAL
  Filled 2016-11-16 (×2): qty 1

## 2016-11-16 MED ORDER — AZELASTINE HCL 0.1 % NA SOLN
NASAL | Status: AC
  Filled 2016-11-16: qty 30

## 2016-11-16 MED ORDER — SODIUM CHLORIDE 0.9% FLUSH
3.0000 mL | Freq: Two times a day (BID) | INTRAVENOUS | Status: DC
  Administered 2016-11-16 – 2016-11-17 (×2): 3 mL via INTRAVENOUS

## 2016-11-16 MED ORDER — DIGOXIN 125 MCG PO TABS
0.1250 mg | ORAL_TABLET | ORAL | Status: DC
  Administered 2016-11-17: 0.125 mg via ORAL
  Filled 2016-11-16: qty 1

## 2016-11-16 NOTE — Telephone Encounter (Signed)
LMTCB  Patient had TEE/cardioversion on 2/19

## 2016-11-16 NOTE — Telephone Encounter (Signed)
Follow up    Pt is returning call back to nurse. She was out and missed the call.

## 2016-11-16 NOTE — ED Provider Notes (Signed)
[facility] DEPT Provider Note   CSN: [id number] Arrival date & time: 11/16/16  1649     History   Chief Complaint Chief Complaint  Patient presents with  . Shortness of Breath    HPI [name] [name] is a 83 y.o. female.  Patient complains of shortness of breath   The history is provided by the patient.  Shortness of Breath  This is a new problem. The problem occurs continuously.The current episode started 3 to 5 hours ago. The problem has not changed since onset.Pertinent negatives include no fever, no headaches, no cough, no chest pain, no abdominal pain and no rash. It is unknown what precipitated the problem.    Past Medical History:  Diagnosis Date  . Adenomatous polyp of colon 12/16/2002   Dr. [name]/[facility] [place]  . Allergy   . Cataract   . CHB (complete heart block) ([place]) 10/07/2014      . CHF (congestive heart failure) ([place])       . Chronic kidney disease, stage I 2006  . Constipation       . COPD (chronic obstructive pulmonary disease) ([place])       . DJD (degenerative joint disease) of lumbar spine   . DM (diabetes mellitus) ([place]) 2006  . Esophageal reflux   . Glaucoma   . Gout       . Heart murmur   . Hyperlipemia   . IDA (iron deficiency anemia)    Parenteral iron/[name]  . Insomnia       . Non-ischemic cardiomyopathy ([place])    a. s/p MDT CRTD  . Obesity, unspecified   . Other and unspecified hyperlipidemia   . Oxygen deficiency 2014   nocturnal;  . Spondylolisthesis   . Spondylosis   . Thyroid cancer ([place])    remote thyroidectomy, no recurrence, pt denies in 2016 thyroid cancer  . Unspecified hypothyroidism       . 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 Vertigo         Patient Active Problem List   Diagnosis Date Noted  . SOB (shortness of breath) 11/16/2016  . Persistent atrial fibrillation ([place])   . Nocturnal hypoxia 10/08/2016  . Dependence on nocturnal oxygen  therapy 10/08/2016  . Hematuria 03/20/2016  . S/P ICD (internal cardiac defibrillator) procedure 02/08/2015  . Back pain without radiation 10/13/2014  . CHB (complete heart block) ([place]) 10/07/2014  . Fatigue 07/20/2014  . Anemia 06/24/2014  . Osteopenia 02/10/2014  . Encounter for therapeutic drug monitoring 10/29/2013  . Spinal stenosis of lumbar region 02/19/2013  . OA (osteoarthritis) of knee 02/19/2013  . Hemolytic anemia ([place]) 09/24/2012  . H/O adenomatous polyp of colon 05/24/2012  . High risk medication use 05/24/2012  . COPD (chronic obstructive pulmonary disease) ([place]) 05/19/2012  . Chronic systolic heart failure ([place]) 05/19/2012  . Chronic anticoagulation, on coumadin for Mechanical AVR and MVR 04/01/2012  . Cardiorenal syndrome with renal failure 03/22/2012  . S/P AVR (aortic valve replacement) 03/22/2012  . S/P MVR (mitral valve replacement) 03/22/2012  . AICD (automatic cardioverter/defibrillator) present 03/22/2012  . CKD (chronic kidney disease), stage IV ([place]) 03/22/2012  . Warfarin-induced coagulopathy ([place]) 03/21/2012  . Papilloma of breast 03/06/2012  . Iron deficiency 10/04/2011  . Allergic rhinitis 02/14/2011  . Nausea without vomiting 06/07/2010  . HIP PAIN, RIGHT 04/23/2009  . VARICOSE VEINS LOWER EXTREMITIES W/OTH COMPS 02/09/2009  . Calculus of gallbladder 02/09/2009  . Type 2 diabetes mellitus ([place])  06/15/2008  . Hypothyroidism, postsurgical 02/24/2008  . Hyperlipidemia LDL goal <70 02/24/2008  . Mild obesity 02/24/2008  . Essential hypertension 02/24/2008  . Non-ischemic cardiomyopathy with ICD 02/24/2008  . GERD 02/24/2008  . DEGENERATIVE JOINT DISEASE, SPINE 02/24/2008    Past Surgical History:  Procedure Laterality Date  . AORTIC AND MITRAL VALVE REPLACEMENT  2001  . BI-VENTRICULAR IMPLANTABLE CARDIOVERTER DEFIBRILLATOR UPGRADE N/A 01/18/2015   Procedure: BI-VENTRICULAR IMPLANTABLE CARDIOVERTER DEFIBRILLATOR UPGRADE;  Surgeon: Evans Lance,  MD;  Location: [facility] CATH LAB;  Service: Cardiovascular;  Laterality: N/A;  . CARDIAC CATHETERIZATION    . CARDIAC VALVE REPLACEMENT    . CARDIOVERSION N/A 11/13/2016   Procedure: CARDIOVERSION;  Surgeon: Sanda Klein, MD;  Location: [facility] ENDOSCOPY;  Service: Cardiovascular;  Laterality: N/A;  . COLONOSCOPY  06/12/2007   [place]k- Normal rectum/Normal colon  . COLONOSCOPY  12/16/02   small hemorrhoids  . COLONOSCOPY  06/20/2012   Procedure: COLONOSCOPY;  Surgeon: Daneil Dolin, MD;  Location: [facility] ENDO SUITE;  Service: Endoscopy;  Laterality: N/A;  10:45  . defibrillator implanted 2006    . DOPPLER ECHOCARDIOGRAPHY  2012  . DOPPLER ECHOCARDIOGRAPHY  05,06,07,08,09,2011  . ESOPHAGOGASTRODUODENOSCOPY  06/12/2007   Rourk- normal esophagus, small hiatal hernia, otherwise normal stomach, D1, D2  . EYE SURGERY Right 2005  . EYE SURGERY Left 2006  . ICD LEAD REMOVAL Left 02/08/2015   Procedure: ICD LEAD REMOVAL;  Surgeon: Evans Lance, MD;  Location: [facility] OR;  Service: Cardiovascular;  Laterality: Left;  "Will plan extraction and insertion of a BiV PM"  **Dr. Roxan Hockey backing up case**  . IMPLANTABLE CARDIOVERTER DEFIBRILLATOR (ICD) GENERATOR CHANGE Left 02/08/2015   Procedure: ICD GENERATOR CHANGE;  Surgeon: Evans Lance, MD;  Location: [facility];  Service: Cardiovascular;  Laterality: Left;  . INSERT / REPLACE / REMOVE PACEMAKER    . PACEMAKER INSERTION  May 2016  . pacemaker placed  2004  . right breast cyst removed     benign   . right cataract removed     2005  . TEE WITHOUT CARDIOVERSION N/A 11/13/2016   Procedure: TRANSESOPHAGEAL ECHOCARDIOGRAM (TEE);  Surgeon: Sanda Klein, MD;  Location: [facility] ENDOSCOPY;  Service: Cardiovascular;  Laterality: N/A;  . THYROIDECTOMY      OB History    Gravida Para Term Preterm AB Living             0   SAB TAB Ectopic Multiple Live Births                   Home Medications    Prior to Admission medications   Medication Sig Start Date End Date  Taking? Authorizing Provider  acetaminophen (TYLENOL) 500 MG tablet Take 500 mg by mouth every 6 (six) hours as needed (pain).   Yes Historical Provider, MD  albuterol (PROVENTIL) (2.5 MG/3ML) 0.083% nebulizer solution INHALE 1 VIAL VIA NEBULIZER THREE TIMES DAILY. 11/08/16  Yes Fayrene Helper, MD  albuterol (VENTOLIN HFA) 108 (90 Base) MCG/ACT inhaler Inhale 2 puffs into the lungs every 6 (six) hours as needed for wheezing or shortness of breath. 10/19/16  Yes Fayrene Helper, MD  allopurinol (ZYLOPRIM) 100 MG tablet Take 1 tablet (100 mg total) by mouth daily. 11/02/16  Yes Fayrene Helper, MD  azelastine (ASTELIN) 0.1 % nasal spray Place 2 sprays into both nostrils 2 (two) times daily. Use in each nostril as directed 10/04/15  Yes Fayrene Helper, MD  benzonatate (TESSALON) 100 MG capsule Take 1 capsule (  100 mg total) by mouth 2 (two) times daily as needed for cough. 10/19/16  Yes Fayrene Helper, MD  cholecalciferol (VITAMIN D) 1000 UNITS tablet Take 1,000 Units by mouth daily.   Yes Historical Provider, MD  COUMADIN 5 MG tablet Take 2 tablets daily Patient taking differently: Take 10 mg by mouth every evening. Take 5mg  on Wednesdays and Saturdays and take 10mg  on all other days 10/23/16  Yes Evans Lance, MD  digoxin (LANOXIN) 0.125 MG tablet Take 1 tablet (0.125 mg total) by mouth every other day. 12/22/15  Yes Mihai Croitoru, MD  docusate sodium (COLACE) 100 MG capsule Take 300 mg by mouth at bedtime.   Yes Historical Provider, MD  ezetimibe-simvastatin (VYTORIN) 10-20 MG tablet Take 1 tablet by mouth 3  times weekly Patient taking differently: Take 1 tablet by mouth every morning.  05/26/16  Yes Fayrene Helper, MD  fluticasone (FLONASE) 50 MCG/ACT nasal spray USE 2 SPRAYS IN EACH       NOSTRIL ONCE DAILY Patient taking differently: USE 2 SPRAYS IN EACH       NOSTRIL ONCE DAILY AS NEEDED FOR CONGESTION 10/25/16  Yes Fayrene Helper, MD  folic acid (FOLVITE) 1 MG tablet Take 1  tablet (1 mg total) by mouth daily. 04/04/16  Yes Fayrene Helper, MD  furosemide (LASIX) 40 MG tablet Take 1 tablet by mouth  daily 11/02/16  Yes Fayrene Helper, MD  gabapentin (NEURONTIN) 300 MG capsule Take 1 capsule (300 mg total) by mouth 2 (two) times daily. Patient taking differently: Take 300 mg by mouth at bedtime.  09/04/16  Yes Fayrene Helper, MD  hydrALAZINE (APRESOLINE) 25 MG tablet Take 1 tablet (25 mg total) by mouth 3 (three) times daily. 05/16/16  Yes Fayrene Helper, MD  HYDROcodone-acetaminophen (NORCO) 7.5-325 MG tablet Take 1 tablet by mouth every 6 (six) hours as needed for moderate pain (Must last 30 days.Do not drive or operate machinery while taking this medicine.). 09/05/16  Yes Sanjuana Kava, MD  isosorbide mononitrate (IMDUR) 60 MG 24 hr tablet take 1 tablet by mouth once daily 11/02/16  Yes Mihai Croitoru, MD  losartan (COZAAR) 50 MG tablet Take 1 tablet by mouth  daily Patient taking differently: Take 50 mg by mouth daily.  05/16/16  Yes Fayrene Helper, MD  metolazone (ZAROXOLYN) 2.5 MG tablet Take 1 tablet (2.5 mg total) by mouth once a week. 10/19/16  Yes Fayrene Helper, MD  ondansetron (ZOFRAN) 4 MG tablet Take 1 tablet (4 mg total) by mouth daily as needed for nausea or vomiting. 09/11/16  Yes Fayrene Helper, MD  Polyethyl [name] (SYSTANE OP) Place 1 drop into both eyes daily as needed (dry eyes).   Yes Historical Provider, MD  potassium chloride SA (KLOR-CON M20) 20 MEQ tablet Take 1 tablet by mouth  daily Patient taking differently: Take 20 mEq by mouth daily. Take 1 tablet by mouth  daily 11/06/16  Yes Fayrene Helper, MD  pravastatin (PRAVACHOL) 20 MG tablet Take 1 tablet (20 mg total) by mouth daily. 11/02/16  Yes Fayrene Helper, MD  promethazine-dextromethorphan (PROMETHAZINE-DM) 6.25-15 MG/5ML syrup One teaspoon at bedtime, as needed, for excessive cough Patient taking differently: Take 5 mLs by mouth at bedtime as needed  for cough. as needed, for excessive cough 10/04/16  Yes Fayrene Helper, MD  SYNTHROID 75 MCG tablet Take 1 tablet by mouth  daily 09/04/16  Yes Fayrene Helper, MD  TOPROL XL 100  MG 24 hr tablet Take 1 tablet (100 mg total) by mouth daily. Take with or immediately following a meal. 10/19/16  Yes Fayrene Helper, MD  calcitRIOL (ROCALTROL) 0.25 MCG capsule Take 1 capsule by mouth 3 (three) times a week. 03/22/16   Historical Provider, MD  cephALEXin (KEFLEX) 500 MG capsule Take 1 capsule (500 mg total) by mouth 4 (four) times daily. Patient not taking: Reported on 11/16/2016 10/31/16   Nat Christen, MD    Family History Family History  Problem Relation Age of Onset  . Cancer Mother     behind pancres  . Heart disease Father   . Heart disease Sister     Heart attack  . Heart disease Brother   . Diabetes Brother   . Heart disease Brother   . Diabetes Brother   . Hypertension Brother   . Cancer Brother     lung    Social History Social History  Substance Use Topics  . Smoking status: Former Smoker    Packs/day: 0.75    Years: 40.00    Types: Cigarettes    Quit date: 03/08/1987  . Smokeless tobacco: Never Used  . Alcohol use No     Allergies   Penicillins; Aspirin; Fentanyl; and Niacin   Review of Systems Review of Systems  Constitutional: Negative for appetite change, fatigue and fever.  HENT: Negative for congestion, ear discharge and sinus pressure.   Eyes: Negative for discharge.  Respiratory: Positive for shortness of breath. Negative for cough.   Cardiovascular: Negative for chest pain.  Gastrointestinal: Negative for abdominal pain and diarrhea.  Genitourinary: Negative for frequency and hematuria.  Musculoskeletal: Negative for back pain.  Skin: Negative for rash.  Neurological: Negative for seizures and headaches.  Psychiatric/Behavioral: Negative for hallucinations.     Physical Exam Updated Vital Signs BP 166/80   Pulse 72   Temp 98.6 F (37 C)  (Oral)   Resp (!) 28   Ht 5\' 7"  (1.702 m)   Wt 217 lb (98.4 kg)   SpO2 92%   BMI 33.99 kg/m   Physical Exam  Constitutional: She is oriented to person, place, and time. She appears well-developed.  HENT:  Head: Normocephalic.  Eyes: Conjunctivae and EOM are normal. No scleral icterus.  Neck: Neck supple. No thyromegaly present.  Cardiovascular: Normal rate and regular rhythm.  Exam reveals no gallop and no friction rub.   No murmur heard. Pulmonary/Chest: No stridor. She has no wheezes. She has no rales. She exhibits no tenderness.  Abdominal: She exhibits no distension. There is no tenderness. There is no rebound.  Musculoskeletal: Normal range of motion. She exhibits edema.  Lymphadenopathy:    She has no cervical adenopathy.  Neurological: She is oriented to person, place, and time. She exhibits normal muscle tone. Coordination normal.  Skin: No rash noted. No erythema.  Psychiatric: She has a normal mood and affect. Her behavior is normal.     ED Treatments / Results  Labs (all labs ordered are listed, but only abnormal results are displayed) Labs Reviewed  CBC WITH DIFFERENTIAL/PLATELET - Abnormal; Notable for the following:       Result Value   RBC 3.33 (*)    Hemoglobin 10.2 (*)    HCT 31.2 (*)    RDW 16.1 (*)    All other components within normal limits  COMPREHENSIVE METABOLIC PANEL - Abnormal; Notable for the following:    Chloride 99 (*)    Glucose, Bld 104 (*)  BUN 49 (*)    Creatinine, Ser 1.61 (*)    GFR calc non Af Amer 29 (*)    GFR calc Af Amer 33 (*)    All other components within normal limits  TROPONIN I - Abnormal; Notable for the following:    Troponin I 0.04 (*)    All other components within normal limits  D-DIMER, QUANTITATIVE (NOT AT [facility]) - Abnormal; Notable for the following:    D-Dimer, Quant 0.82 (*)    All other components within normal limits  BRAIN NATRIURETIC PEPTIDE - Abnormal; Notable for the following:    B Natriuretic  Peptide 290.0 (*)    All other components within normal limits    EKG  EKG Interpretation None       Radiology Dg Chest 2 View  Result Date: 11/16/2016 CLINICAL DATA:  Acute onset of shortness of breath. Initial encounter. EXAM: CHEST  2 VIEW COMPARISON:  Chest radiograph performed 10/09/2016 FINDINGS: The lungs are well-aerated. Vascular congestion is noted. There is stable nodularity at the right hilum, largely unchanged from 2005. Mildly increased interstitial markings may reflect minimal interstitial edema. There is no evidence of pleural effusion or pneumothorax. The heart is mildly enlarged. A pacemaker/AICD is noted at the left chest wall, with leads ending at the right atrium, right ventricle and coronary sinus. No acute osseous abnormalities are seen. Minimal chronic anterior wedging is noted at the mid thoracic spine. IMPRESSION: Vascular congestion and mild cardiomegaly. Mildly increased interstitial markings may reflect minimal interstitial edema. Electronically Signed   By: Garald Balding M.D.   On: 11/16/2016 17:30   Ct Angio Chest Pe W And/or Wo Contrast  Result Date: 11/16/2016 CLINICAL DATA:  SOB x 2 days, elevated troponin, pacer, chf, reduced dose due to gfr of 33 EXAM: CT ANGIOGRAPHY CHEST WITH CONTRAST TECHNIQUE: Multidetector CT imaging of the chest was performed using the standard protocol during bolus administration of intravenous contrast. Multiplanar CT image reconstructions and MIPs were obtained to evaluate the vascular anatomy. CONTRAST:  50 mL Isovue 370 IV COMPARISON:  None. FINDINGS: Cardiovascular: Right arm IV contrast administration. Left subclavian transvenous pacing AICD leads extend into the coronary sinus, right atrium and right ventricle. SVC remains patent. Right atrial enlargement. Some reflux of contrast from the right atrium into the IVC. Right ventricle is nondilated. Dilated central pulmonary arteries. Satisfactory opacification of pulmonary arteries  noted, and there is no evidence of pulmonary emboli. Patent pulmonary veins drain into the left atrium. There is mild left atrial enlargement. Previous mitral valve replacement surgery. Scattered coronary calcifications. Previous aortic valve replacement surgery. Adequate contrast opacification of the thoracic aorta with no evidence of dissection, aneurysm, or stenosis. There is dilated ascending aorta up to 4.2 cm diameter. Classic 3 vessel Brachiocephalic arch anatomy without proximal stenosis. Moderate atheromatous calcifications throughout the thoracic aorta. Mediastinum/Nodes: No pericardial effusion. No adenopathy localized. Lungs/Pleura: No pleural effusion. No pneumothorax. Lungs are clear. Upper Abdomen: Multiple cystic lesions in the enlarged spleen with associated curvilinear coarse calcifications, as before. No acute findings. Musculoskeletal: Mild anterior compression deformity of T7 vertebral body, visible on previous chest radiographs. Previous median sternotomy. No acute fracture or worrisome bone lesion. Review of the MIP images confirms the above findings. IMPRESSION: 1. Negative for acute PE or thoracic aortic dissection. 2. Extensive coronary and aortic atheromatous calcifications. 3. 4.2 cm ascending aortic aneurysm. Recommend annual imaging followup by CTA or MRA. This recommendation follows 2010 ACCF/AHA/AATS/ACR/ASA/SCA/SCAI/SIR/STS/SVM Guidelines for the Diagnosis and Management of Patients with Thoracic  Aortic Disease. Circulation. 2010; 121: D326-Z124 Electronically Signed   By: Lucrezia Europe M.D.   On: 11/16/2016 19:25    Procedures Procedures (including critical care time)  Medications Ordered in ED Medications  furosemide (LASIX) injection 40 mg (not administered)  iopamidol (ISOVUE-370) 76 % injection 50 mL (50 mLs Intravenous Contrast Given 11/16/16 1903)     Initial Impression / Assessment and Plan / ED Course  I have reviewed the triage vital signs and the nursing  notes.  Pertinent labs & imaging results that were available during my care of the patient were reviewed by me and considered in my medical decision making (see chart for details).     Patient with mild congestive heart failure. She will be admitted and diuresed  Final Clinical Impressions(s) / ED Diagnoses   Final diagnoses:  SOB (shortness of breath)    New Prescriptions New Prescriptions   No medications on file     Milton Ferguson, MD 11/16/16 2013

## 2016-11-16 NOTE — Telephone Encounter (Signed)
EKG obtained direct to ED per Dr Bronson Ing

## 2016-11-16 NOTE — Patient Instructions (Signed)
We will take you to the ED now

## 2016-11-16 NOTE — Progress Notes (Signed)
Arrives SOB,ankles swollen with pitting edema, wt is up 9  Lbs, pacer interrogated by Memory Dance RN, in SR but thoracic impedance has been abnormal since 11/09/16     Will notify Dr Bronson Ing and take patient to ED

## 2016-11-16 NOTE — Telephone Encounter (Signed)
Per notes, patient went to ED

## 2016-11-16 NOTE — ED Notes (Signed)
CRITICAL VALUE ALERT  Critical value received:  Troponin 0.04  Date of notification:  11/16/16  Time of notification:  4081  Critical value read back:Yes.    Nurse who received alert:  Norm Salt, RN  MD notified (1st page):  Dr. Roderic Palau  Time of first page:  [phone]  MD notified (2nd page):  Time of second page:  Responding MD:  Dr. Roderic Palau  Time MD responded:  [phone]

## 2016-11-16 NOTE — H&P (Signed)
History and Physical    [name] [name] WCH:[id number] DOB: [birth year] DOA: 11/16/2016  PCP: [name], MD  Patient coming from: home  Chief Complaint:  DOE  HPI: [name] [name] is a 83 y.o. female with medical history significant of multiple valve replacement on coumadin, AICD, complete heart block, systolic CHF, COPD, DM, HTN comes in with 2 days of DOE, no chest pain.  Has some mild edema in her legs.  Her dry wt is 223 lbs, she has not weighed at home in a week.  She does not think she has gained much.  Her wt here today indicates she has gained 9 lbs, which is probably inaccurate.  She denies any PND, orthopnea, chest pain, palpitations.  At rest she is fine.  She reports her symptoms have resolved since arrival to ED.  She was given 40mg  iv lasix in ED and has urinated a lot.  She is compliant with her home meds.  Pt has normal oxygen sats on RA, referred for admission for chf exacerbation by EDP.  Review of Systems: As per HPI otherwise 10 point review of systems negative.   Past Medical History:  Diagnosis Date  . Adenomatous polyp of colon 12/16/2002   Dr. [name]/[facility]  . Allergy   . Cataract   . CHB (complete heart block) ([place]) 10/07/2014      . CHF (congestive heart failure) ([place])       . Chronic kidney disease, stage I 2006  . Constipation       . COPD (chronic obstructive pulmonary disease) ([place])       . DJD (degenerative joint disease) of lumbar spine   . DM (diabetes mellitus) ([place]) 2006  . Esophageal reflux   . Glaucoma   . Gout       . Heart murmur   . Hyperlipemia   . IDA (iron deficiency anemia)    Parenteral iron/[name]  . Insomnia       . Non-ischemic cardiomyopathy ([place])    a. s/p MDT CRTD  . Obesity, unspecified   . Other and unspecified hyperlipidemia   . Oxygen deficiency 2014   nocturnal;  . Spondylolisthesis   . Spondylosis   . Thyroid cancer ([place])    remote thyroidectomy, no recurrence, pt  denies in 2016 thyroid cancer  . Unspecified hypothyroidism       . 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 Vertigo         Past Surgical History:  Procedure Laterality Date  . AORTIC AND MITRAL VALVE REPLACEMENT  2001  . BI-VENTRICULAR IMPLANTABLE CARDIOVERTER DEFIBRILLATOR UPGRADE N/A 01/18/2015   Procedure: BI-VENTRICULAR IMPLANTABLE CARDIOVERTER DEFIBRILLATOR UPGRADE;  Surgeon: Evans Lance, MD;  Location: [facility] CATH LAB;  Service: Cardiovascular;  Laterality: N/A;  . CARDIAC CATHETERIZATION    . CARDIAC VALVE REPLACEMENT    . CARDIOVERSION N/A 11/13/2016   Procedure: CARDIOVERSION;  Surgeon: Sanda Klein, MD;  Location: [facility] ENDOSCOPY;  Service: Cardiovascular;  Laterality: N/A;  . COLONOSCOPY  06/12/2007   Rourk- Normal rectum/Normal colon  . COLONOSCOPY  12/16/02   small hemorrhoids  . COLONOSCOPY  06/20/2012   Procedure: COLONOSCOPY;  Surgeon: Daneil Dolin, MD;  Location: [facility] ENDO SUITE;  Service: Endoscopy;  Laterality: N/A;  10:45  . defibrillator implanted 2006    . DOPPLER ECHOCARDIOGRAPHY  2012  . DOPPLER ECHOCARDIOGRAPHY  05,06,07,08,09,2011  . ESOPHAGOGASTRODUODENOSCOPY  06/12/2007   Rourk- normal esophagus, small hiatal hernia, otherwise normal stomach,  D1, D2  . EYE SURGERY Right 2005  . EYE SURGERY Left 2006  . ICD LEAD REMOVAL Left 02/08/2015   Procedure: ICD LEAD REMOVAL;  Surgeon: Evans Lance, MD;  Location: [facility] OR;  Service: Cardiovascular;  Laterality: Left;  "Will plan extraction and insertion of a BiV PM"  **Dr. Roxan Hockey backing up case**  . IMPLANTABLE CARDIOVERTER DEFIBRILLATOR (ICD) GENERATOR CHANGE Left 02/08/2015   Procedure: ICD GENERATOR CHANGE;  Surgeon: Evans Lance, MD;  Location: [place];  Service: Cardiovascular;  Laterality: Left;  . INSERT / REPLACE / REMOVE PACEMAKER    . PACEMAKER INSERTION  May 2016  . pacemaker placed  2004  . right breast cyst removed     benign   . right cataract removed     2005  . TEE  WITHOUT CARDIOVERSION N/A 11/13/2016   Procedure: TRANSESOPHAGEAL ECHOCARDIOGRAM (TEE);  Surgeon: Sanda Klein, MD;  Location: [facility] ENDOSCOPY;  Service: Cardiovascular;  Laterality: N/A;  . THYROIDECTOMY       reports that she quit smoking about 29 years ago. Her smoking use included Cigarettes. She has a 30.00 pack-year smoking history. She has never used smokeless tobacco. She reports that she does not drink alcohol or use drugs.  Allergies pcn, fentanyl , other all reviewed  Family History  Problem Relation Age of Onset  . Cancer Mother     behind pancres  . Heart disease Father   . Heart disease Sister     Heart attack  . Heart disease Brother   . Diabetes Brother   . Heart disease Brother   . Diabetes Brother   . Hypertension Brother   . Cancer Brother     lung    Prior to Admission medications   Medication Sig Start Date End Date Taking? Authorizing Provider  acetaminophen (TYLENOL) 500 MG tablet Take 500 mg by mouth every 6 (six) hours as needed (pain).   Yes Historical Provider, MD  albuterol (PROVENTIL) (2.5 MG/3ML) 0.083% nebulizer solution INHALE 1 VIAL VIA NEBULIZER THREE TIMES DAILY. 11/08/16  Yes Fayrene Helper, MD  albuterol (VENTOLIN HFA) 108 (90 Base) MCG/ACT inhaler Inhale 2 puffs into the lungs every 6 (six) hours as needed for wheezing or shortness of breath. 10/19/16  Yes Fayrene Helper, MD  allopurinol (ZYLOPRIM) 100 MG tablet Take 1 tablet (100 mg total) by mouth daily. 11/02/16  Yes Fayrene Helper, MD  azelastine (ASTELIN) 0.1 % nasal spray Place 2 sprays into both nostrils 2 (two) times daily. Use in each nostril as directed 10/04/15  Yes Fayrene Helper, MD  benzonatate (TESSALON) 100 MG capsule Take 1 capsule (100 mg total) by mouth 2 (two) times daily as needed for cough. 10/19/16  Yes Fayrene Helper, MD  cholecalciferol (VITAMIN D) 1000 UNITS tablet Take 1,000 Units by mouth daily.   Yes Historical Provider, MD  COUMADIN 5 MG tablet Take 2  tablets daily Patient taking differently: Take 10 mg by mouth every evening. Take 5mg  on Wednesdays and Saturdays and take 10mg  on all other days 10/23/16  Yes Evans Lance, MD  digoxin (LANOXIN) 0.125 MG tablet Take 1 tablet (0.125 mg total) by mouth every other day. 12/22/15  Yes Mihai Croitoru, MD  docusate sodium (COLACE) 100 MG capsule Take 300 mg by mouth at bedtime.   Yes Historical Provider, MD  ezetimibe-simvastatin (VYTORIN) 10-20 MG tablet Take 1 tablet by mouth 3  times weekly Patient taking differently: Take 1 tablet by mouth every morning.  05/26/16  Yes Fayrene Helper, MD  fluticasone (FLONASE) 50 MCG/ACT nasal spray USE 2 SPRAYS IN EACH       NOSTRIL ONCE DAILY Patient taking differently: USE 2 SPRAYS IN EACH       NOSTRIL ONCE DAILY AS NEEDED FOR CONGESTION 10/25/16  Yes Fayrene Helper, MD  folic acid (FOLVITE) 1 MG tablet Take 1 tablet (1 mg total) by mouth daily. 04/04/16  Yes Fayrene Helper, MD  furosemide (LASIX) 40 MG tablet Take 1 tablet by mouth  daily 11/02/16  Yes Fayrene Helper, MD  gabapentin (NEURONTIN) 300 MG capsule Take 1 capsule (300 mg total) by mouth 2 (two) times daily. Patient taking differently: Take 300 mg by mouth at bedtime.  09/04/16  Yes Fayrene Helper, MD  hydrALAZINE (APRESOLINE) 25 MG tablet Take 1 tablet (25 mg total) by mouth 3 (three) times daily. 05/16/16  Yes Fayrene Helper, MD  HYDROcodone-acetaminophen (NORCO) 7.5-325 MG tablet Take 1 tablet by mouth every 6 (six) hours as needed for moderate pain (Must last 30 days.Do not drive or operate machinery while taking this medicine.). 09/05/16  Yes Sanjuana Kava, MD  isosorbide mononitrate (IMDUR) 60 MG 24 hr tablet take 1 tablet by mouth once daily 11/02/16  Yes Mihai Croitoru, MD  losartan (COZAAR) 50 MG tablet Take 1 tablet by mouth  daily Patient taking differently: Take 50 mg by mouth daily.  05/16/16  Yes Fayrene Helper, MD  metolazone (ZAROXOLYN) 2.5 MG tablet Take 1 tablet  (2.5 mg total) by mouth once a week. 10/19/16  Yes Fayrene Helper, MD  ondansetron (ZOFRAN) 4 MG tablet Take 1 tablet (4 mg total) by mouth daily as needed for nausea or vomiting. 09/11/16  Yes Fayrene Helper, MD  Polyethyl [name] (SYSTANE OP) Place 1 drop into both eyes daily as needed (dry eyes).   Yes Historical Provider, MD  potassium chloride SA (KLOR-CON M20) 20 MEQ tablet Take 1 tablet by mouth  daily Patient taking differently: Take 20 mEq by mouth daily. Take 1 tablet by mouth  daily 11/06/16  Yes Fayrene Helper, MD  pravastatin (PRAVACHOL) 20 MG tablet Take 1 tablet (20 mg total) by mouth daily. 11/02/16  Yes Fayrene Helper, MD  promethazine-dextromethorphan (PROMETHAZINE-DM) 6.25-15 MG/5ML syrup One teaspoon at bedtime, as needed, for excessive cough Patient taking differently: Take 5 mLs by mouth at bedtime as needed for cough. as needed, for excessive cough 10/04/16  Yes Fayrene Helper, MD  SYNTHROID 75 MCG tablet Take 1 tablet by mouth  daily 09/04/16  Yes Fayrene Helper, MD  TOPROL XL 100 MG 24 hr tablet Take 1 tablet (100 mg total) by mouth daily. Take with or immediately following a meal. 10/19/16  Yes Fayrene Helper, MD  calcitRIOL (ROCALTROL) 0.25 MCG capsule Take 1 capsule by mouth 3 (three) times a week. 03/22/16   Historical Provider, MD  cephALEXin (KEFLEX) 500 MG capsule Take 1 capsule (500 mg total) by mouth 4 (four) times daily. Patient not taking: Reported on 11/16/2016 10/31/16   Nat Christen, MD    Physical Exam: Vitals:   11/16/16 1659 11/16/16 1730 11/16/16 1800 11/16/16 1830  BP: 140/58 132/61 147/66 166/80  Pulse: 70 69 67 72  Resp: 17 22 (!) 29 (!) 28  Temp: 98.6 F (37 C)     TempSrc: Oral     SpO2: 96% 95% 97% 92%  Weight:      Height:  Constitutional: NAD, calm, comfortable Vitals:   11/16/16 1659 11/16/16 1730 11/16/16 1800 11/16/16 1830  BP: 140/58 132/61 147/66 166/80  Pulse: 70 69 67 72  Resp: 17 22 (!) 29  (!) 28  Temp: 98.6 F (37 C)     TempSrc: Oral     SpO2: 96% 95% 97% 92%  Weight:      Height:       Eyes: PERRL, lids and conjunctivae normal ENMT: Mucous membranes are moist. Posterior pharynx clear of any exudate or lesions.Normal dentition.  Neck: normal, supple, no masses, no thyromegaly Respiratory: clear to auscultation bilaterally, no wheezing, no crackles. Normal respiratory effort. No accessory muscle use.  Cardiovascular: Regular rate and rhythm, no murmurs / rubs / gallops. Trace ble edema. 2+ pedal pulses. No carotid bruits.  Abdomen: no tenderness, no masses palpated. No hepatosplenomegaly. Bowel sounds positive.  Musculoskeletal: no clubbing / cyanosis. No joint deformity upper and lower extremities. Good ROM, no contractures. Normal muscle tone.  Skin: no rashes, lesions, ulcers. No induration Neurologic: CN 2-12 grossly intact. Sensation intact, DTR normal. Strength 5/5 in all 4.  Psychiatric: Normal judgment and insight. Alert and oriented x 3. Normal mood.    Labs on Admission: I have personally reviewed following labs and imaging studies  CBC:  Recent Labs Lab 11/13/16 0944 11/16/16 1727  WBC  --  6.8  NEUTROABS  --  5.0  HGB 10.2* 10.2*  HCT 30.0* 31.2*  MCV  --  93.7  PLT  --  768   Basic Metabolic Panel:  Recent Labs Lab 11/13/16 0944 11/16/16 1727  NA 138 136  K 4.6 4.5  CL 101 99*  CO2  --  30  GLUCOSE 100* 104*  BUN 50* 49*  CREATININE 1.80* 1.61*  CALCIUM  --  9.3   GFR: Estimated Creatinine Clearance: 32.4 mL/min (by C-G formula based on SCr of 1.61 mg/dL (H)). Liver Function Tests:  Recent Labs Lab 11/16/16 1727  AST 27  ALT 17  ALKPHOS 59  BILITOT 0.3  PROT 7.3  ALBUMIN 3.9   Coagulation Profile:  Recent Labs Lab 11/13/16 0915  INR 2.28   Cardiac Enzymes:  Recent Labs Lab 11/16/16 1727  TROPONINI 0.04*   Radiological Exams on Admission: Dg Chest 2 View  Result Date: 11/16/2016 CLINICAL DATA:  Acute onset of  shortness of breath. Initial encounter. EXAM: CHEST  2 VIEW COMPARISON:  Chest radiograph performed 10/09/2016 FINDINGS: The lungs are well-aerated. Vascular congestion is noted. There is stable nodularity at the right hilum, largely unchanged from 2005. Mildly increased interstitial markings may reflect minimal interstitial edema. There is no evidence of pleural effusion or pneumothorax. The heart is mildly enlarged. A pacemaker/AICD is noted at the left chest wall, with leads ending at the right atrium, right ventricle and coronary sinus. No acute osseous abnormalities are seen. Minimal chronic anterior wedging is noted at the mid thoracic spine. IMPRESSION: Vascular congestion and mild cardiomegaly. Mildly increased interstitial markings may reflect minimal interstitial edema. Electronically Signed   By: Garald Balding M.D.   On: 11/16/2016 17:30   Ct Angio Chest Pe W And/or Wo Contrast  Result Date: 11/16/2016 CLINICAL DATA:  SOB x 2 days, elevated troponin, pacer, chf, reduced dose due to gfr of 33 EXAM: CT ANGIOGRAPHY CHEST WITH CONTRAST TECHNIQUE: Multidetector CT imaging of the chest was performed using the standard protocol during bolus administration of intravenous contrast. Multiplanar CT image reconstructions and MIPs were obtained to evaluate the vascular anatomy. CONTRAST:  50 mL Isovue 370 IV COMPARISON:  None. FINDINGS: Cardiovascular: Right arm IV contrast administration. Left subclavian transvenous pacing AICD leads extend into the coronary sinus, right atrium and right ventricle. SVC remains patent. Right atrial enlargement. Some reflux of contrast from the right atrium into the IVC. Right ventricle is nondilated. Dilated central pulmonary arteries. Satisfactory opacification of pulmonary arteries noted, and there is no evidence of pulmonary emboli. Patent pulmonary veins drain into the left atrium. There is mild left atrial enlargement. Previous mitral valve replacement surgery. Scattered  coronary calcifications. Previous aortic valve replacement surgery. Adequate contrast opacification of the thoracic aorta with no evidence of dissection, aneurysm, or stenosis. There is dilated ascending aorta up to 4.2 cm diameter. Classic 3 vessel Brachiocephalic arch anatomy without proximal stenosis. Moderate atheromatous calcifications throughout the thoracic aorta. Mediastinum/Nodes: No pericardial effusion. No adenopathy localized. Lungs/Pleura: No pleural effusion. No pneumothorax. Lungs are clear. Upper Abdomen: Multiple cystic lesions in the enlarged spleen with associated curvilinear coarse calcifications, as before. No acute findings. Musculoskeletal: Mild anterior compression deformity of T7 vertebral body, visible on previous chest radiographs. Previous median sternotomy. No acute fracture or worrisome bone lesion. Review of the MIP images confirms the above findings. IMPRESSION: 1. Negative for acute PE or thoracic aortic dissection. 2. Extensive coronary and aortic atheromatous calcifications. 3. 4.2 cm ascending aortic aneurysm. Recommend annual imaging followup by CTA or MRA. This recommendation follows 2010 ACCF/AHA/AATS/ACR/ASA/SCA/SCAI/SIR/STS/SVM Guidelines for the Diagnosis and Management of Patients with Thoracic Aortic Disease. Circulation. 2010; 121: D983-J825 Electronically Signed   By: Lucrezia Europe M.D.   On: 11/16/2016 19:25   Old chart reviewed Case discussed with EDP cxr reviewed with edema ekg reviewed paced  Assessment/Plan 83 yo female with mild acute chf exacerbation systolic dysfunction  Principal Problem:   Acute on chronic systolic CHF (congestive heart failure) ([place])- lungs are clear.  Vitals are all normal on RA including oxygen sats.  Place on iv lasix 40mg  q 12 hours.  Will not repeat echo. Ambulate pt in am and measure o2 on RA, she wears 2 liters [place] only at night.  Can likely go home tomorrow if remains same or better.  Ck dig level.  Active Problems:   SOB  (shortness of breath)- as above   Type 2 diabetes mellitus ([place])-  SSI   Essential hypertension- stable   Non-ischemic cardiomyopathy with ICD- noted   S/P AVR (aortic valve replacement)- coumadin    S/P MVR (mitral valve replacement)- cont coumadin   CKD (chronic kidney disease), stage IV (HCC)- at baseline cr 1. 6   Chronic anticoagulation, on coumadin for Mechanical AVR and MVR- cont coumadin   COPD (chronic obstructive pulmonary disease) ([place])- stable and compensated   Med rec not complete, await completion   DVT prophylaxis:  coumadin Code Status:  full Family Communication: none Disposition Plan:  Per day team Consults called:  none Admission status:  Observation    Adrin Julian A MD [facility]  If 7PM-7AM, please contact night-coverage [email or web address] Password [facility]  11/16/2016, 8:19 PM

## 2016-11-16 NOTE — Telephone Encounter (Signed)
Received call from Sheral Apley RN at [facility] that Deborha was c/o palpitations.She just a  ICD cardioversion on 11/13/16. She declined to see her cardiologist in [place] due to distance.Offered for her to have EKG here and contact Dr Marca Ancona rounder of results.    Await patient arrival.

## 2016-11-16 NOTE — Telephone Encounter (Signed)
New message    Pt calling asking if Dr. Sallyanne Kuster has any suggestions for her heart beating faster than normal.   Patient c/o Palpitations:  High priority if patient c/o lightheadedness and shortness of breath.  1. How long have you been having palpitations? A couple days ago-after procedure.  2. Are you currently experiencing lightheadedness and shortness of breath? Some SOB-can not hear over the phone-pt states she used her inhaler. Was dizzy this morning but took some medication for it.  3. Have you checked your BP and heart rate? (document readings) no.  4. Are you experiencing any other symptoms? No.

## 2016-11-16 NOTE — ED Triage Notes (Signed)
[facility] from [facility] to have [facility] performed.  Pt has been sob for 2 days, denies cp. Pt has ICD from May 2016 and had settings "reset" on feb 22.

## 2016-11-16 NOTE — Telephone Encounter (Signed)
Patient had TEE/cardioversion on 2/19 Complains of shortness of breath & palpitations since 2/19 Not short of breath on the phone She states her palpitations occur at night No chest pain with this Patient does not monitor BP or HR Patient is taking all medication as prescribed Patient states her inhaler helps with her palpitations  Asked patient if it would be OK if I made a call to our [place] office to see if she can go get an EKG check  [place] office can add patient on for EKG check today  Contacted patient w/this info. She will contact someone to see if she can get a ride.  Called patient back. She states she has a ride and is getting ready to leave to go to [place] office for EKG check.  Patient aware of office location.   Routed to [facility] triage & MD as Juluis Rainier

## 2016-11-16 NOTE — Telephone Encounter (Signed)
Even better, have her do a remote device check and notify device clinic.

## 2016-11-17 ENCOUNTER — Encounter: Payer: Self-pay | Admitting: Internal Medicine

## 2016-11-17 DIAGNOSIS — R0602 Shortness of breath: Secondary | ICD-10-CM

## 2016-11-17 DIAGNOSIS — Z7901 Long term (current) use of anticoagulants: Secondary | ICD-10-CM

## 2016-11-17 DIAGNOSIS — N184 Chronic kidney disease, stage 4 (severe): Secondary | ICD-10-CM

## 2016-11-17 DIAGNOSIS — Z9111 Patient's noncompliance with dietary regimen: Secondary | ICD-10-CM | POA: Diagnosis not present

## 2016-11-17 DIAGNOSIS — I5023 Acute on chronic systolic (congestive) heart failure: Secondary | ICD-10-CM

## 2016-11-17 DIAGNOSIS — Z952 Presence of prosthetic heart valve: Secondary | ICD-10-CM

## 2016-11-17 DIAGNOSIS — I10 Essential (primary) hypertension: Secondary | ICD-10-CM

## 2016-11-17 DIAGNOSIS — I428 Other cardiomyopathies: Secondary | ICD-10-CM | POA: Diagnosis not present

## 2016-11-17 DIAGNOSIS — I4891 Unspecified atrial fibrillation: Secondary | ICD-10-CM | POA: Diagnosis not present

## 2016-11-17 DIAGNOSIS — I5043 Acute on chronic combined systolic (congestive) and diastolic (congestive) heart failure: Secondary | ICD-10-CM | POA: Diagnosis not present

## 2016-11-17 DIAGNOSIS — I130 Hypertensive heart and chronic kidney disease with heart failure and stage 1 through stage 4 chronic kidney disease, or unspecified chronic kidney disease: Secondary | ICD-10-CM | POA: Diagnosis not present

## 2016-11-17 LAB — BASIC METABOLIC PANEL
Anion gap: 8 (ref 5–15)
BUN: 45 mg/dL — ABNORMAL HIGH (ref 6–20)
CO2: 28 mmol/L (ref 22–32)
Calcium: 9.2 mg/dL (ref 8.9–10.3)
Chloride: 100 mmol/L — ABNORMAL LOW (ref 101–111)
Creatinine, Ser: 1.51 mg/dL — ABNORMAL HIGH (ref 0.44–1.00)
GFR calc Af Amer: 36 mL/min — ABNORMAL LOW (ref >60)
GFR calc non Af Amer: 31 mL/min — ABNORMAL LOW (ref >60)
Glucose, Bld: 102 mg/dL — ABNORMAL HIGH (ref 65–99)
Potassium: 4.1 mmol/L (ref 3.5–5.1)
Sodium: 136 mmol/L (ref 135–145)

## 2016-11-17 LAB — PROTIME-INR
INR: 2.31
Prothrombin Time: 25.8 seconds — ABNORMAL HIGH (ref 11.4–15.2)

## 2016-11-17 LAB — TROPONIN I
Troponin I: 0.04 ng/mL (ref <0.03)
Troponin I: 0.03 ng/mL (ref <0.03)

## 2016-11-17 MED ORDER — WARFARIN SODIUM 5 MG PO TABS: 10.0000 mg | ORAL_TABLET | Freq: Once | ORAL | Status: DC

## 2016-11-17 MED ORDER — WARFARIN - PHARMACIST DOSING INPATIENT: Status: DC

## 2016-11-17 NOTE — Care Management Note (Signed)
Case Management Note  Patient Details  Name: [name] [name] MRN: [id number] Date of Birth: [birth year]  Subjective/Objective:                  Pt admitted obs for CHF. She is from home, lives with her nephew. She uses a cane with ambulation and is ind with ADL's. Her PCP is Dr. [name]. Her family members drive her to appointments. She was recently DC'd from [facility]. We will resume those services with pt's approval. Pt is aware that [facility] has 48hrs to make first visit. Romualdo Bolk, of [facility], aware of referral and will obtain pt info from chart.   Action/Plan: Discharging home today with [facility] services through [facility].   Expected Discharge Date:  11/17/16               Expected Discharge Plan:  [place]  In-House Referral:    N/A  Discharge planning Services  CM Consult  Post Acute Care Choice:  Home Health Choice offered to:  Patient  HH Arranged:  RN, PT [facility] Agency:  [place]  Status of Service:  Completed, signed off  Sherald Barge, RN 11/17/2016, 12:50 PM

## 2016-11-17 NOTE — Progress Notes (Signed)
Pt's IV catheter removed and intact. Pt's IV site clean dry and intact. Discharge instructions including medications and follow up appointments were reviewed and discussed with patient. Pt verbalized understanding of discharge instructions including follow up appointments. All questions were answered and no further questions at this time. Pt in no acute distress at time of discharge. Pt escorted by nurse tech.

## 2016-11-17 NOTE — Progress Notes (Signed)
ANTICOAGULATION CONSULT NOTE - Initial Consult  Pharmacy Consult for COUMADIN (chronic Rx PTA) Indication: mechanical valve  Patient Measurements: Height: 5\' 7"  (170.2 cm) Weight: 223 lb 5.2 oz (101.3 kg) IBW/kg (Calculated) : 61.6  Vital Signs: Temp: 98.3 F (36.8 C) (02/23 0543) Temp Source: Oral (02/23 0543) BP: 134/56 (02/23 0543) Pulse Rate: 70 (02/23 0543)  Labs:  Recent Labs  11/16/16 1727 11/16/16 2224 11/17/16 0428  HGB 10.2*  --   --   HCT 31.2*  --   --   PLT 180  --   --   LABPROT 26.4*  --  25.8*  INR 2.38  --  2.31  CREATININE 1.61*  --  1.51*  TROPONINI 0.04* 0.04* 0.04*    Estimated Creatinine Clearance: 35.1 mL/min (by C-G formula based on SCr of 1.51 mg/dL (H)).   Medical History: Past Medical History:  Diagnosis Date  . Adenomatous polyp of colon 12/16/2002   Dr. [name]/[facility]  . Allergy   . Cataract   . CHB (complete heart block) ([place]) 10/07/2014      . CHF (congestive heart failure) ([place])       . Chronic kidney disease, stage I 2006  . Constipation       . COPD (chronic obstructive pulmonary disease) ([place])       . DJD (degenerative joint disease) of lumbar spine   . DM (diabetes mellitus) ([place]) 2006  . Esophageal reflux   . Glaucoma   . Gout       . Heart murmur   . Hyperlipemia   . IDA (iron deficiency anemia)    Parenteral iron/[name]  . Insomnia       . Non-ischemic cardiomyopathy ([place])    a. s/p MDT CRTD  . Obesity, unspecified   . Other and unspecified hyperlipidemia   . Oxygen deficiency 2014   nocturnal;  . Spondylolisthesis   . Spondylosis   . Thyroid cancer ([place])    remote thyroidectomy, no recurrence, pt denies in 2016 thyroid cancer  . Unspecified hypothyroidism       . 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 Vertigo        Medications:  Prescriptions Prior to Admission  Medication Sig Dispense Refill Last Dose  . acetaminophen (TYLENOL)  500 MG tablet Take 500 mg by mouth every 6 (six) hours as needed (pain).   unknown  . albuterol (PROVENTIL) (2.5 MG/3ML) 0.083% nebulizer solution INHALE 1 VIAL VIA NEBULIZER THREE TIMES DAILY. 300 mL 0 11/16/2016 at Unknown time  . albuterol (VENTOLIN HFA) 108 (90 Base) MCG/ACT inhaler Inhale 2 puffs into the lungs every 6 (six) hours as needed for wheezing or shortness of breath. 3 Inhaler 3 11/16/2016 at Unknown time  . allopurinol (ZYLOPRIM) 100 MG tablet Take 1 tablet (100 mg total) by mouth daily. 90 tablet 1 11/16/2016 at Unknown time  . azelastine (ASTELIN) 0.1 % nasal spray Place 2 sprays into both nostrils 2 (two) times daily. Use in each nostril as directed 30 mL 12 11/16/2016 at Unknown time  . benzonatate (TESSALON) 100 MG capsule Take 1 capsule (100 mg total) by mouth 2 (two) times daily as needed for cough. 30 capsule 0 Past Week at Unknown time  . cholecalciferol (VITAMIN D) 1000 UNITS tablet Take 1,000 Units by mouth daily.   11/16/2016 at Unknown time  . COUMADIN 5 MG tablet Take 2 tablets daily (Patient taking differently: Take 10 mg by mouth every  evening. Take 5mg  on Wednesdays and Saturdays and take 10mg  on all other days) 10 tablet 1 11/15/2016 at 2030  . digoxin (LANOXIN) 0.125 MG tablet Take 1 tablet (0.125 mg total) by mouth every other day. 90 tablet 3 11/16/2016 at Unknown time  . docusate sodium (COLACE) 100 MG capsule Take 300 mg by mouth at bedtime.   11/15/2016 at Unknown time  . ezetimibe-simvastatin (VYTORIN) 10-20 MG tablet Take 1 tablet by mouth 3  times weekly (Patient taking differently: Take 1 tablet by mouth every morning. ) 36 tablet 1 11/16/2016 at Unknown time  . fluticasone (FLONASE) 50 MCG/ACT nasal spray USE 2 SPRAYS IN EACH       NOSTRIL ONCE DAILY (Patient taking differently: USE 2 SPRAYS IN EACH       NOSTRIL ONCE DAILY AS NEEDED FOR CONGESTION) 48 g 1 unknown  . folic acid (FOLVITE) 1 MG tablet Take 1 tablet (1 mg total) by mouth daily. 100 tablet 3 11/16/2016 at  Unknown time  . furosemide (LASIX) 40 MG tablet Take 1 tablet by mouth  daily 90 tablet 1 11/16/2016 at Unknown time  . gabapentin (NEURONTIN) 300 MG capsule Take 1 capsule (300 mg total) by mouth 2 (two) times daily. (Patient taking differently: Take 300 mg by mouth at bedtime. ) 180 capsule 1 11/15/2016 at Unknown time  . hydrALAZINE (APRESOLINE) 25 MG tablet Take 1 tablet (25 mg total) by mouth 3 (three) times daily. 270 tablet 3 11/16/2016 at Unknown time  . HYDROcodone-acetaminophen (NORCO) 7.5-325 MG tablet Take 1 tablet by mouth every 6 (six) hours as needed for moderate pain (Must last 30 days.Do not drive or operate machinery while taking this medicine.). 110 tablet 0 unknown  . isosorbide mononitrate (IMDUR) 60 MG 24 hr tablet take 1 tablet by mouth once daily 90 tablet 1 11/16/2016 at Unknown time  . losartan (COZAAR) 50 MG tablet Take 1 tablet by mouth  daily (Patient taking differently: Take 50 mg by mouth daily. ) 90 tablet 1 11/16/2016 at Unknown time  . metolazone (ZAROXOLYN) 2.5 MG tablet Take 1 tablet (2.5 mg total) by mouth once a week. 12 tablet 2 11/15/2016 at Unknown time  . ondansetron (ZOFRAN) 4 MG tablet Take 1 tablet (4 mg total) by mouth daily as needed for nausea or vomiting. 90 tablet 0 unknown  . Polyethyl [name] (SYSTANE OP) Place 1 drop into both eyes daily as needed (dry eyes).   unknown  . potassium chloride SA (KLOR-CON M20) 20 MEQ tablet Take 1 tablet by mouth  daily (Patient taking differently: Take 20 mEq by mouth daily. Take 1 tablet by mouth  daily) 30 tablet 0 11/16/2016 at Unknown time  . pravastatin (PRAVACHOL) 20 MG tablet Take 1 tablet (20 mg total) by mouth daily. 90 tablet 1 11/16/2016 at Unknown time  . promethazine-dextromethorphan (PROMETHAZINE-DM) 6.25-15 MG/5ML syrup One teaspoon at bedtime, as needed, for excessive cough (Patient taking differently: Take 5 mLs by mouth at bedtime as needed for cough. as needed, for excessive cough) 180 mL 0  unknown  . SYNTHROID 75 MCG tablet Take 1 tablet by mouth  daily 90 tablet 1 11/16/2016 at Unknown time  . TOPROL XL 100 MG 24 hr tablet Take 1 tablet (100 mg total) by mouth daily. Take with or immediately following a meal. 90 tablet 1 11/15/2016 at 2030  . calcitRIOL (ROCALTROL) 0.25 MCG capsule Take 1 capsule by mouth 3 (three) times a week.  0 Taking  . cephALEXin (KEFLEX) 500  MG capsule Take 1 capsule (500 mg total) by mouth 4 (four) times daily. (Patient not taking: Reported on 11/16/2016) 28 capsule 0 Not Taking at Unknown time   Assessment: 83yo female on chronic Coumadin PTA.  Pt w/ h/o SVR and MVR.  Per Coumadin clinic INR goal 2.5 - 3.5.  Home dose listed above.  INR slightly below target today.    Goal of Therapy:  2.5 - 3.5 Monitor platelets by anticoagulation protocol: Yes   Plan:  Coumadin 10mg  today x 1 INR daily  Mc Hollen A 11/17/2016,8:38 AM

## 2016-11-17 NOTE — Discharge Summary (Signed)
Physician Discharge Summary  [name] [name] UMP:[id number] DOB: Sep 24, 1932 DOA: 11/16/2016  PCP: [name], MD  Admit date: 11/16/2016 Discharge date: 11/17/2016  Time spent: 45 minutes  Recommendations for Outpatient Follow-up:  -Will be discharged home today. -Advised to follow up with Dr. Sallyanne Kuster in 1-2 weeks.   Discharge Diagnoses:  Principal Problem:   Acute on chronic systolic CHF (congestive heart failure) (HCC) Active Problems:   Type 2 diabetes mellitus (HCC)   Essential hypertension   Non-ischemic cardiomyopathy with ICD   S/P AVR (aortic valve replacement)   S/P MVR (mitral valve replacement)   CKD (chronic kidney disease), stage IV (HCC)   Chronic anticoagulation, on coumadin for Mechanical AVR and MVR   COPD (chronic obstructive pulmonary disease) (HCC)   SOB (shortness of breath)   Discharge Condition: Stable and improved  Filed Weights   11/16/16 1658 11/16/16 2240 11/17/16 0543  Weight: 98.4 kg (217 lb) 100.6 kg (221 lb 12.8 oz) 101.3 kg (223 lb 5.2 oz)    History of present illness:  As per Dr. Shanon Brow on 2/22: [name] [name] is a 83 y.o. female with medical history significant of multiple valve replacement on coumadin, AICD, complete heart block, systolic CHF, COPD, DM, HTN comes in with 2 days of DOE, no chest pain.  Has some mild edema in her legs.  Her dry wt is 223 lbs, she has not weighed at home in a week.  She does not think she has gained much.  Her wt here today indicates she has gained 9 lbs, which is probably inaccurate.  She denies any PND, orthopnea, chest pain, palpitations.  At rest she is fine.  She reports her symptoms have resolved since arrival to ED.  She was given 40mg  iv lasix in ED and has urinated a lot.  She is compliant with her home meds.  Pt has normal oxygen sats on RA, referred for admission for chf exacerbation by EDP.  Hospital Course:   Mild exacerbation of acute or chronic systolic CHF -Is currently not short of  breath, appears euvolemic, has no oxygen requirements. -Patient anxious to be discharged home, cardiology agrees. Follow up outpatient with her primary cardiologist.  Rest of chronic conditions are stable during the assessment of her hospitalization.  Procedures:  None   Consultations:  Cardiology  Discharge Instructions  Discharge Instructions    Diet - low sodium heart healthy    Complete by:  As directed    Increase activity slowly    Complete by:  As directed            Medication List    STOP taking these medications   cephALEXin 500 MG capsule Commonly known as:  KEFLEX     TAKE these medications   acetaminophen 500 MG tablet Commonly known as:  TYLENOL Take 500 mg by mouth every 6 (six) hours as needed (pain).   albuterol 108 (90 Base) MCG/ACT inhaler Commonly known as:  VENTOLIN HFA Inhale 2 puffs into the lungs every 6 (six) hours as needed for wheezing or shortness of breath.   albuterol (2.5 MG/3ML) 0.083% nebulizer solution Commonly known as:  PROVENTIL INHALE 1 VIAL VIA NEBULIZER THREE TIMES DAILY.   allopurinol 100 MG tablet Commonly known as:  ZYLOPRIM Take 1 tablet (100 mg total) by mouth daily.   azelastine 0.1 % nasal spray Commonly known as:  ASTELIN Place 2 sprays into both nostrils 2 (two) times daily. Use in each nostril as directed   benzonatate 100  MG capsule Commonly known as:  TESSALON Take 1 capsule (100 mg total) by mouth 2 (two) times daily as needed for cough.   cholecalciferol 1000 units tablet Commonly known as:  VITAMIN D Take 1,000 Units by mouth daily.   COUMADIN 5 MG tablet Generic drug:  warfarin Take 2 tablets daily What changed:  how much to take  how to take this  when to take this  additional instructions   digoxin 0.125 MG tablet Commonly known as:  LANOXIN Take 1 tablet (0.125 mg total) by mouth every other day.   docusate sodium 100 MG capsule Commonly known as:  COLACE Take 300 mg by mouth at  bedtime.   ezetimibe-simvastatin 10-20 MG tablet Commonly known as:  VYTORIN Take 1 tablet by mouth 3  times weekly What changed:  how much to take  how to take this  when to take this  additional instructions   fluticasone 50 MCG/ACT nasal spray Commonly known as:  FLONASE USE 2 SPRAYS IN EACH       NOSTRIL ONCE DAILY What changed:  See the new instructions.   folic acid 1 MG tablet Commonly known as:  FOLVITE Take 1 tablet (1 mg total) by mouth daily.   furosemide 40 MG tablet Commonly known as:  LASIX Take 1 tablet by mouth  daily   gabapentin 300 MG capsule Commonly known as:  NEURONTIN Take 1 capsule (300 mg total) by mouth 2 (two) times daily. What changed:  when to take this   hydrALAZINE 25 MG tablet Commonly known as:  APRESOLINE Take 1 tablet (25 mg total) by mouth 3 (three) times daily.   HYDROcodone-acetaminophen 7.5-325 MG tablet Commonly known as:  NORCO Take 1 tablet by mouth every 6 (six) hours as needed for moderate pain (Must last 30 days.Do not drive or operate machinery while taking this medicine.).   isosorbide mononitrate 60 MG 24 hr tablet Commonly known as:  IMDUR take 1 tablet by mouth once daily   losartan 50 MG tablet Commonly known as:  COZAAR Take 1 tablet by mouth  daily What changed:  how much to take  how to take this  when to take this  additional instructions   metolazone 2.5 MG tablet Commonly known as:  ZAROXOLYN Take 1 tablet (2.5 mg total) by mouth once a week.   ondansetron 4 MG tablet Commonly known as:  ZOFRAN Take 1 tablet (4 mg total) by mouth daily as needed for nausea or vomiting.   potassium chloride SA 20 MEQ tablet Commonly known as:  KLOR-CON M20 Take 1 tablet by mouth  daily What changed:  how much to take  how to take this  when to take this  additional instructions   pravastatin 20 MG tablet Commonly known as:  PRAVACHOL Take 1 tablet (20 mg total) by mouth daily.     promethazine-dextromethorphan 6.25-15 MG/5ML syrup Commonly known as:  PROMETHAZINE-DM One teaspoon at bedtime, as needed, for excessive cough What changed:  how much to take  how to take this  when to take this  reasons to take this  additional instructions   SYNTHROID 75 MCG tablet Generic drug:  levothyroxine Take 1 tablet by mouth  daily   SYSTANE OP Place 1 drop into both eyes daily as needed (dry eyes).   TOPROL XL 100 MG 24 hr tablet Generic drug:  metoprolol succinate Take 1 tablet (100 mg total) by mouth daily. Take with or immediately following a meal.  Follow-up Information    [name], MD. Schedule an appointment as soon as possible for a visit in 2 weeks.   Specialty:  Family Medicine Contact information: [address] [place] [address] [place] Follow up.   Contact information: [address] [place]  [address] [phone]            The results of significant diagnostics from this hospitalization (including imaging, microbiology, ancillary and laboratory) are listed below for reference.    Significant Diagnostic Studies: Dg Chest 2 View  Result Date: 11/16/2016 CLINICAL DATA:  Acute onset of shortness of breath. Initial encounter. EXAM: CHEST  2 VIEW COMPARISON:  Chest radiograph performed 10/09/2016 FINDINGS: The lungs are well-aerated. Vascular congestion is noted. There is stable nodularity at the right hilum, largely unchanged from 2005. Mildly increased interstitial markings may reflect minimal interstitial edema. There is no evidence of pleural effusion or pneumothorax. The heart is mildly enlarged. A pacemaker/AICD is noted at the left chest wall, with leads ending at the right atrium, right ventricle and coronary sinus. No acute osseous abnormalities are seen. Minimal chronic anterior wedging is noted at the mid thoracic spine. IMPRESSION: Vascular  congestion and mild cardiomegaly. Mildly increased interstitial markings may reflect minimal interstitial edema. Electronically Signed   By: Garald Balding M.D.   On: 11/16/2016 17:30   Ct Angio Chest Pe W And/or Wo Contrast  Result Date: 11/16/2016 CLINICAL DATA:  SOB x 2 days, elevated troponin, pacer, chf, reduced dose due to gfr of 33 EXAM: CT ANGIOGRAPHY CHEST WITH CONTRAST TECHNIQUE: Multidetector CT imaging of the chest was performed using the standard protocol during bolus administration of intravenous contrast. Multiplanar CT image reconstructions and MIPs were obtained to evaluate the vascular anatomy. CONTRAST:  50 mL Isovue 370 IV COMPARISON:  None. FINDINGS: Cardiovascular: Right arm IV contrast administration. Left subclavian transvenous pacing AICD leads extend into the coronary sinus, right atrium and right ventricle. SVC remains patent. Right atrial enlargement. Some reflux of contrast from the right atrium into the IVC. Right ventricle is nondilated. Dilated central pulmonary arteries. Satisfactory opacification of pulmonary arteries noted, and there is no evidence of pulmonary emboli. Patent pulmonary veins drain into the left atrium. There is mild left atrial enlargement. Previous mitral valve replacement surgery. Scattered coronary calcifications. Previous aortic valve replacement surgery. Adequate contrast opacification of the thoracic aorta with no evidence of dissection, aneurysm, or stenosis. There is dilated ascending aorta up to 4.2 cm diameter. Classic 3 vessel Brachiocephalic arch anatomy without proximal stenosis. Moderate atheromatous calcifications throughout the thoracic aorta. Mediastinum/Nodes: No pericardial effusion. No adenopathy localized. Lungs/Pleura: No pleural effusion. No pneumothorax. Lungs are clear. Upper Abdomen: Multiple cystic lesions in the enlarged spleen with associated curvilinear coarse calcifications, as before. No acute findings. Musculoskeletal: Mild  anterior compression deformity of T7 vertebral body, visible on previous chest radiographs. Previous median sternotomy. No acute fracture or worrisome bone lesion. Review of the MIP images confirms the above findings. IMPRESSION: 1. Negative for acute PE or thoracic aortic dissection. 2. Extensive coronary and aortic atheromatous calcifications. 3. 4.2 cm ascending aortic aneurysm. Recommend annual imaging followup by CTA or MRA. This recommendation follows 2010 ACCF/AHA/AATS/ACR/ASA/SCA/SCAI/SIR/STS/SVM Guidelines for the Diagnosis and Management of Patients with Thoracic Aortic Disease. Circulation. 2010; 121: [id number] Electronically Signed   By: Lucrezia Europe M.D.   On: 11/16/2016 19:25    Microbiology: No results found for this or any previous visit (  from the past 240 hour(s)).   Labs: Basic Metabolic Panel:  Recent Labs Lab 11/13/16 0944 11/16/16 1727 11/17/16 0428  NA 138 136 136  K 4.6 4.5 4.1  CL 101 99* 100*  CO2  --  30 28  GLUCOSE 100* 104* 102*  BUN 50* 49* 45*  CREATININE 1.80* 1.61* 1.51*  CALCIUM  --  9.3 9.2   Liver Function Tests:  Recent Labs Lab 11/16/16 1727  AST 27  ALT 17  ALKPHOS 59  BILITOT 0.3  PROT 7.3  ALBUMIN 3.9   No results for input(s): LIPASE, AMYLASE in the last 168 hours. No results for input(s): AMMONIA in the last 168 hours. CBC:  Recent Labs Lab 11/13/16 0944 11/16/16 1727  WBC  --  6.8  NEUTROABS  --  5.0  HGB 10.2* 10.2*  HCT 30.0* 31.2*  MCV  --  93.7  PLT  --  180   Cardiac Enzymes:  Recent Labs Lab 11/16/16 1727 11/16/16 2224 11/17/16 0428 11/17/16 1031  TROPONINI 0.04* 0.04* 0.04* 0.03*   BNP: BNP (last 3 results)  Recent Labs  10/09/16 1433 11/16/16 1832  BNP 232.0* 290.0*    ProBNP (last 3 results) No results for input(s): PROBNP in the last 8760 hours.  CBG: No results for input(s): GLUCAP in the last 168 hours.     SignedLelon Frohlich  [facility] Pager:  [phone] 11/17/2016, 5:05 PM

## 2016-11-17 NOTE — Consult Note (Signed)
CARDIOLOGY CONSULT NOTE   Patient ID: [name] [name] MRN: [id number] DOB/AGE: 11-24-1931 83 y.o.  Admit Date: 11/16/2016 Referring Physician: Erasmo Score MD Primary Physician: [name], MD Consulting Cardiologist:Koneswaran, Jamesetta So MD Primary Cardiologist: Sallyanne Kuster, MD Reason for Consultation: CHF  Clinical Summary [name] is a 83 y.o.female with known history of Atrial fibrillation, status post DCCV by Dr. Sallyanne Kuster,  oh on 11/13/2016, with a postprocedure device check showing a paced with bi-V paced rhythm. Normal CRT device function. This was completed after transesophageal echocardiogram which did not reveal LA thrombus, normal MV the prosthesis function  "physiological MR.  Other history includes complete heart block, CRT-D in situ, Mitral valve replacement on coumadin, chronic combined systolic and diastolic heart failure, recent history of "the flu" for which she was treated with azithromycin, COPD.    She admits to eating salty foods at home, to include chicken noodle soup and potato chips. She noticed increase wheezing, lower extremity edema and shortness of breath which led to ER evaluation.    She came to ER with complaints of dyspnea, with LEE and weight gain and admitted for CHF exacerbation. On arrival to ER, BP 126./60, HR 70, O2 Sat 93%. BNP 290.0 Hgb 10.2, Hct 31.2. Creatinine 1.61. Troponin 0.04,0.04, and 0.03. CXR revealed vascular congestion and mild cardiomegaly. Possible minimal edema. She was treated with IV lasix 40 mg. she continues on Lasix, 40 mg IV twice a day. I know have not actually been documented at this time. The preparation reports frequent urination with improvement in breathing status and lower extremity edema. Weight does not reflect diuresis.    Medications Scheduled Medications: . allopurinol  100 mg Oral Daily  . azelastine  2 spray Each Nare BID  . calcitRIOL  0.25 mcg Oral Once per day on Mon Wed Fri  . cholecalciferol  1,000  Units Oral Daily  . digoxin  0.125 mg Oral QODAY  . docusate sodium  300 mg Oral QHS  . folic acid  1 mg Oral Daily  . furosemide  40 mg Intravenous BID  . hydrALAZINE  25 mg Oral TID  . isosorbide mononitrate  60 mg Oral Daily  . [START ON 11/22/2016] metolazone  2.5 mg Oral Weekly  . metoprolol succinate  100 mg Oral Daily  . pravastatin  20 mg Oral q1800  . sodium chloride flush  3 mL Intravenous Q12H  . warfarin  10 mg Oral Once  . Warfarin - Pharmacist Dosing Inpatient   Does not apply Q24H     Infusions:   PRN Medications:  sodium chloride, acetaminophen, acetaminophen, albuterol, benzonatate, HYDROcodone-acetaminophen, ondansetron (ZOFRAN) IV, ondansetron, sodium chloride flush   Past Medical History:  Diagnosis Date  . Adenomatous polyp of colon 12/16/2002   Dr. [name]/[facility]  . Allergy   . Cataract   . CHB (complete heart block) ([place]) 10/07/2014      . CHF (congestive heart failure) ([place])       . Chronic kidney disease, stage I 2006  . Constipation       . COPD (chronic obstructive pulmonary disease) ([place])       . DJD (degenerative joint disease) of lumbar spine   . DM (diabetes mellitus) ([place]) 2006  . Esophageal reflux   . Glaucoma   . Gout       . Heart murmur   . Hyperlipemia   . IDA (iron deficiency anemia)    Parenteral iron/[name]  . Insomnia       .  Non-ischemic cardiomyopathy ([place])    a. s/p MDT CRTD  . Obesity, unspecified   . Other and unspecified hyperlipidemia   . Oxygen deficiency 2014   nocturnal;  . Spondylolisthesis   . Spondylosis   . Thyroid cancer ([place])    remote thyroidectomy, no recurrence, pt denies in 2016 thyroid cancer  . Unspecified hypothyroidism       . 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 Vertigo         Past Surgical History:  Procedure Laterality Date  . AORTIC AND MITRAL VALVE REPLACEMENT  2001  . BI-VENTRICULAR IMPLANTABLE CARDIOVERTER  DEFIBRILLATOR UPGRADE N/A 01/18/2015   Procedure: BI-VENTRICULAR IMPLANTABLE CARDIOVERTER DEFIBRILLATOR UPGRADE;  Surgeon: Evans Lance, MD;  Location: [facility] CATH LAB;  Service: Cardiovascular;  Laterality: N/A;  . CARDIAC CATHETERIZATION    . CARDIAC VALVE REPLACEMENT    . CARDIOVERSION N/A 11/13/2016   Procedure: CARDIOVERSION;  Surgeon: Sanda Klein, MD;  Location: [facility] ENDOSCOPY;  Service: Cardiovascular;  Laterality: N/A;  . COLONOSCOPY  06/12/2007   Rourk- Normal rectum/Normal colon  . COLONOSCOPY  12/16/02   small hemorrhoids  . COLONOSCOPY  06/20/2012   Procedure: COLONOSCOPY;  Surgeon: Daneil Dolin, MD;  Location: [facility] ENDO SUITE;  Service: Endoscopy;  Laterality: N/A;  10:45  . defibrillator implanted 2006    . DOPPLER ECHOCARDIOGRAPHY  2012  . DOPPLER ECHOCARDIOGRAPHY  05,06,07,08,09,2011  . ESOPHAGOGASTRODUODENOSCOPY  06/12/2007   Rourk- normal esophagus, small hiatal hernia, otherwise normal stomach, D1, D2  . EYE SURGERY Right 2005  . EYE SURGERY Left 2006  . ICD LEAD REMOVAL Left 02/08/2015   Procedure: ICD LEAD REMOVAL;  Surgeon: Evans Lance, MD;  Location: [facility] OR;  Service: Cardiovascular;  Laterality: Left;  "Will plan extraction and insertion of a BiV PM"  **Dr. Roxan Hockey backing up case**  . IMPLANTABLE CARDIOVERTER DEFIBRILLATOR (ICD) GENERATOR CHANGE Left 02/08/2015   Procedure: ICD GENERATOR CHANGE;  Surgeon: Evans Lance, MD;  Location: [facility];  Service: Cardiovascular;  Laterality: Left;  . INSERT / REPLACE / REMOVE PACEMAKER    . PACEMAKER INSERTION  May 2016  . pacemaker placed  2004  . right breast cyst removed     benign   . right cataract removed     2005  . TEE WITHOUT CARDIOVERSION N/A 11/13/2016   Procedure: TRANSESOPHAGEAL ECHOCARDIOGRAM (TEE);  Surgeon: Sanda Klein, MD;  Location: [facility] ENDOSCOPY;  Service: Cardiovascular;  Laterality: N/A;  . THYROIDECTOMY      Family History  Problem Relation Age of Onset  . Cancer Mother     behind pancres    . Heart disease Father   . Heart disease Sister     Heart attack  . Heart disease Brother   . Diabetes Brother   . Heart disease Brother   . Diabetes Brother   . Hypertension Brother   . Cancer Brother     lung     Social History Ms. Rebello reports that she quit smoking about 29 years ago. Her smoking use included Cigarettes. She has a 30.00 pack-year smoking history. She has never used smokeless tobacco. Ms. Rando reports that she does not drink alcohol.  Review of Systems Complete review of systems are found to be negative unless outlined in H&P above.  Physical Examination Blood pressure (!) 155/68, pulse 70, temperature 98.3 F (36.8 C), temperature source Oral, resp. rate 20, height 5\' 7"  (1.702 m), weight 223 lb 5.2 oz (101.3 kg), SpO2 96 %.  Intake/Output Summary (Last 24 hours) at 11/17/16 1150 Last data filed at 11/17/16 0700  Gross per 24 hour  Intake              240 ml  Output                0 ml  Net              240 ml    Telemetry:AV pacing  GEN: No acute distress HEENT: Conjunctiva and lids normal, oropharynx clear with moist mucosa. Neck: Supple, no elevated JVP or carotid bruits, no thyromegaly. Lungs: Bilateral crackles, worse on the lower left than on the right, no wheezes, no rhonchi. Cardiac: Regular rate and rhythm, with criisp click,  no S3 or significant systolic murmur, no pericardial rub. Abdomen: Soft, nontender, no hepatomegaly, bowel sounds present, no guarding or rebound. Extremities: No pitting edema, distal pulses 2+. Skin: Warm and dry. Musculoskeletal: No kyphosis. Neuropsychiatric: Alert and oriented x3, affect grossly appropriate.  Prior Cardiac Testing/Procedures Echocardiogram: 06/02/2017.  Left ventricle: The cavity size was normal. Wall thickness was   increased in a pattern of moderate LVH. Systolic function was   moderately to severely reduced. The estimated ejection fraction   was in the range of 30% to 35%. Diffuse  hypokinesis. The study is   not technically sufficient to allow evaluation of LV diastolic   function. - Aortic valve: Normally functioning mechanical aortic valve noted. - Mitral valve: Normally functioning mechanical mitral valve noted. - Left atrium: The atrium was moderately dilated. - Right ventricle: Pacer wire or catheter noted in right ventricle.   Systolic function was mildly reduced. - Right atrium: The atrium was mildly dilated. Pacer wire or   catheter noted in right atrium. - Tricuspid valve: There was mild regurgitation. - Pulmonic valve: There was mild regurgitation. - Pulmonary arteries: PA peak pressure: 36 mm Hg (S). - Pericardium, extracardiac: A trivial pericardial effusion was   identified.  Implantable device check 11/08/2016 Conclusion   Device check in clinic via [place] [facility]. See scanned report for details. 48.4% AT/AF burden + coumadin. (1) VT-NS episode. Stable thoracic impedance. 99.1%Bp with 0.4% as VSRp. DCCV scheduled.[place]    Lab Results  Basic Metabolic Panel:  Recent Labs Lab 11/13/16 0944 11/16/16 1727 11/17/16 0428  NA 138 136 136  K 4.6 4.5 4.1  CL 101 99* 100*  CO2  --  30 28  GLUCOSE 100* 104* 102*  BUN 50* 49* 45*  CREATININE 1.80* 1.61* 1.51*  CALCIUM  --  9.3 9.2    Liver Function Tests:  Recent Labs Lab 11/16/16 1727  AST 27  ALT 17  ALKPHOS 59  BILITOT 0.3  PROT 7.3  ALBUMIN 3.9    CBC:  Recent Labs Lab 11/13/16 0944 11/16/16 1727  WBC  --  6.8  NEUTROABS  --  5.0  HGB 10.2* 10.2*  HCT 30.0* 31.2*  MCV  --  93.7  PLT  --  180    Cardiac Enzymes:  Recent Labs Lab 11/16/16 1727 11/16/16 2224 11/17/16 0428 11/17/16 1031  TROPONINI 0.04* 0.04* 0.04* 0.03*    Radiology: Dg Chest 2 View  Result Date: 11/16/2016 CLINICAL DATA:  Acute onset of shortness of breath. Initial encounter. EXAM: CHEST  2 VIEW COMPARISON:  Chest radiograph performed 10/09/2016 FINDINGS: The lungs are  well-aerated. Vascular congestion is noted. There is stable nodularity at the right hilum, largely unchanged from 2005. Mildly increased interstitial markings may reflect minimal interstitial edema.  There is no evidence of pleural effusion or pneumothorax. The heart is mildly enlarged. A pacemaker/AICD is noted at the left chest wall, with leads ending at the right atrium, right ventricle and coronary sinus. No acute osseous abnormalities are seen. Minimal chronic anterior wedging is noted at the mid thoracic spine. IMPRESSION: Vascular congestion and mild cardiomegaly. Mildly increased interstitial markings may reflect minimal interstitial edema. Electronically Signed   By: Garald Balding M.D.   On: 11/16/2016 17:30   Ct Angio Chest Pe W And/or Wo Contrast  Result Date: 11/16/2016 CLINICAL DATA:  SOB x 2 days, elevated troponin, pacer, chf, reduced dose due to gfr of 33 EXAM: CT ANGIOGRAPHY CHEST WITH CONTRAST TECHNIQUE: Multidetector CT imaging of the chest was performed using the standard protocol during bolus administration of intravenous contrast. Multiplanar CT image reconstructions and MIPs were obtained to evaluate the vascular anatomy. CONTRAST:  50 mL Isovue 370 IV COMPARISON:  None. FINDINGS: Cardiovascular: Right arm IV contrast administration. Left subclavian transvenous pacing AICD leads extend into the coronary sinus, right atrium and right ventricle. SVC remains patent. Right atrial enlargement. Some reflux of contrast from the right atrium into the IVC. Right ventricle is nondilated. Dilated central pulmonary arteries. Satisfactory opacification of pulmonary arteries noted, and there is no evidence of pulmonary emboli. Patent pulmonary veins drain into the left atrium. There is mild left atrial enlargement. Previous mitral valve replacement surgery. Scattered coronary calcifications. Previous aortic valve replacement surgery. Adequate contrast opacification of the thoracic aorta with no evidence  of dissection, aneurysm, or stenosis. There is dilated ascending aorta up to 4.2 cm diameter. Classic 3 vessel Brachiocephalic arch anatomy without proximal stenosis. Moderate atheromatous calcifications throughout the thoracic aorta. Mediastinum/Nodes: No pericardial effusion. No adenopathy localized. Lungs/Pleura: No pleural effusion. No pneumothorax. Lungs are clear. Upper Abdomen: Multiple cystic lesions in the enlarged spleen with associated curvilinear coarse calcifications, as before. No acute findings. Musculoskeletal: Mild anterior compression deformity of T7 vertebral body, visible on previous chest radiographs. Previous median sternotomy. No acute fracture or worrisome bone lesion. Review of the MIP images confirms the above findings. IMPRESSION: 1. Negative for acute PE or thoracic aortic dissection. 2. Extensive coronary and aortic atheromatous calcifications. 3. 4.2 cm ascending aortic aneurysm. Recommend annual imaging followup by CTA or MRA. This recommendation follows 2010 ACCF/AHA/AATS/ACR/ASA/SCA/SCAI/SIR/STS/SVM Guidelines for the Diagnosis and Management of Patients with Thoracic Aortic Disease. Circulation. 2010; 121: [id number] Electronically Signed   By: Lucrezia Europe M.D.   On: 11/16/2016 19:25     ECG: AV pacing   Impression and Recommendations   1.Acute on Chronic Combined CHF: The patient admits to dietary noncompliance. She remains on IV Lasix. Accurate I and O's have not been documented at this time. Weight is not reflect patient's reported diuresis which she states has been frequent since admission. Breathing status is improved. No significant edema. Review of home medications reveals that she is on Lasix 40 mg daily. May need to increase his dose if she continues to have issues with salt intake. She remains on digoxin 0.125 mg daily, losartan, and metolazone. Would follow up with Dr. Sallyanne Kuster  as outpatient for ongoing management on discharge.  2. Hypertension: Elevated, and  not optimal for current LV function. Patient remains on hydralazine, ARB, and isosorbide. She is currently is not on beta blocker, likely because of history of COPD.Marland Kitchen Expect blood pressure to improve with diuresis, but will likely need to adjust medications. Consider increasing hydralazine to 50 mg 3 times a day.  3. Mitral valve repair: Remains on Coumadin therapy. INR this morning 2.31. With PT of 25.8. She denies active bleeding or bruising.  4. ICD in situ: Has been interrogated within the last 2 weeks and functioning appropriately. Continue ongoing remote checks along with in person evaluation per protocol    Signed: Phill Myron. Lawrence NP [place]  11/17/2016, 11:50 AM Co-Sign MD  The patient was seen and examined, and I agree with the history, physical exam, assessment and plan as documented above, with modifications as noted below. 83 yr old woman with aforementioned medical history admitted with acute on chronic combined systolic and diastolic heart failure, LVEF 30-35% by TEE on 11/15/16. She does not weigh herself daily nor adhere to a low sodium diet as prescribed by Dr. Sallyanne Kuster in his most recent office note. I explained the importance of this to the patient.  I told her should her weight go up by 3 lbs in 24 hrs associated with leg swelling and/or shortness of breath, she can take an extra 40 mg of Lasix.  She is feeling much better. I agree that hydralazine may need to be increased to 50 mg TID for more optimal BP control.   She can be discharged from my perspective and follow up with Dr. Sallyanne Kuster.  Kate Sable, MD, [facility]  11/17/2016 12:49 PM

## 2016-11-17 NOTE — Care Management Obs Status (Signed)
[facility] NOTIFICATION   Patient Details  Name: Lark Langenfeld MRN: [id number] Date of Birth: 1932-05-08   Medicare Observation Status Notification Given:  Other (see comment) (pt DC <24hrs)    Sherald Barge, RN 11/17/2016, 12:48 PM

## 2016-11-19 DIAGNOSIS — J449 Chronic obstructive pulmonary disease, unspecified: Secondary | ICD-10-CM | POA: Diagnosis not present

## 2016-11-19 DIAGNOSIS — M5136 Other intervertebral disc degeneration, lumbar region: Secondary | ICD-10-CM | POA: Diagnosis not present

## 2016-11-19 DIAGNOSIS — Z7901 Long term (current) use of anticoagulants: Secondary | ICD-10-CM | POA: Diagnosis not present

## 2016-11-19 DIAGNOSIS — I130 Hypertensive heart and chronic kidney disease with heart failure and stage 1 through stage 4 chronic kidney disease, or unspecified chronic kidney disease: Secondary | ICD-10-CM | POA: Diagnosis not present

## 2016-11-19 DIAGNOSIS — N184 Chronic kidney disease, stage 4 (severe): Secondary | ICD-10-CM | POA: Diagnosis not present

## 2016-11-19 DIAGNOSIS — Z5181 Encounter for therapeutic drug level monitoring: Secondary | ICD-10-CM | POA: Diagnosis not present

## 2016-11-19 DIAGNOSIS — I429 Cardiomyopathy, unspecified: Secondary | ICD-10-CM | POA: Diagnosis not present

## 2016-11-19 DIAGNOSIS — Z952 Presence of prosthetic heart valve: Secondary | ICD-10-CM | POA: Diagnosis not present

## 2016-11-19 DIAGNOSIS — I5023 Acute on chronic systolic (congestive) heart failure: Secondary | ICD-10-CM | POA: Diagnosis not present

## 2016-11-19 DIAGNOSIS — Z7951 Long term (current) use of inhaled steroids: Secondary | ICD-10-CM | POA: Diagnosis not present

## 2016-11-19 DIAGNOSIS — Z95810 Presence of automatic (implantable) cardiac defibrillator: Secondary | ICD-10-CM | POA: Diagnosis not present

## 2016-11-19 DIAGNOSIS — E1122 Type 2 diabetes mellitus with diabetic chronic kidney disease: Secondary | ICD-10-CM | POA: Diagnosis not present

## 2016-11-19 DIAGNOSIS — Z87891 Personal history of nicotine dependence: Secondary | ICD-10-CM | POA: Diagnosis not present

## 2016-11-20 ENCOUNTER — Telehealth: Payer: Self-pay | Admitting: Orthopaedic Surgery

## 2016-11-20 ENCOUNTER — Telehealth: Payer: Self-pay | Admitting: Cardiology

## 2016-11-20 ENCOUNTER — Ambulatory Visit (INDEPENDENT_AMBULATORY_CARE_PROVIDER_SITE_OTHER): Payer: Medicare Other | Admitting: *Deleted

## 2016-11-20 DIAGNOSIS — Z95810 Presence of automatic (implantable) cardiac defibrillator: Secondary | ICD-10-CM

## 2016-11-20 MED ORDER — HYDROCODONE-ACETAMINOPHEN 7.5-325 MG PO TABS: 1.0000 | ORAL_TABLET | Freq: Four times a day (QID) | ORAL | 0 refills | Status: DC | PRN

## 2016-11-20 NOTE — Telephone Encounter (Signed)
Spoke with pt and reminded pt of remote transmission that is due today. Pt verbalized understanding.   

## 2016-11-20 NOTE — Progress Notes (Signed)
Remote ICD transmission.   

## 2016-11-20 NOTE — Telephone Encounter (Signed)
Patient requests refill on Hydrocodone/Acetaminophen 7.5-325   Mgs.  Qty  110  Sig: Take 1 tablet by mouth every 6 (six) hours as needed for moderate pain (Must last 30 days.Do not drive or operate machinery while taking this medicine.).

## 2016-11-21 ENCOUNTER — Encounter: Payer: Self-pay | Admitting: Cardiology

## 2016-11-21 DIAGNOSIS — I429 Cardiomyopathy, unspecified: Secondary | ICD-10-CM | POA: Diagnosis not present

## 2016-11-21 DIAGNOSIS — I5023 Acute on chronic systolic (congestive) heart failure: Secondary | ICD-10-CM | POA: Diagnosis not present

## 2016-11-21 DIAGNOSIS — N184 Chronic kidney disease, stage 4 (severe): Secondary | ICD-10-CM | POA: Diagnosis not present

## 2016-11-21 DIAGNOSIS — I130 Hypertensive heart and chronic kidney disease with heart failure and stage 1 through stage 4 chronic kidney disease, or unspecified chronic kidney disease: Secondary | ICD-10-CM | POA: Diagnosis not present

## 2016-11-21 DIAGNOSIS — J449 Chronic obstructive pulmonary disease, unspecified: Secondary | ICD-10-CM | POA: Diagnosis not present

## 2016-11-21 DIAGNOSIS — E1122 Type 2 diabetes mellitus with diabetic chronic kidney disease: Secondary | ICD-10-CM | POA: Diagnosis not present

## 2016-11-22 ENCOUNTER — Ambulatory Visit (INDEPENDENT_AMBULATORY_CARE_PROVIDER_SITE_OTHER): Payer: Medicare Other | Admitting: *Deleted

## 2016-11-22 DIAGNOSIS — I429 Cardiomyopathy, unspecified: Secondary | ICD-10-CM | POA: Diagnosis not present

## 2016-11-22 DIAGNOSIS — N184 Chronic kidney disease, stage 4 (severe): Secondary | ICD-10-CM | POA: Diagnosis not present

## 2016-11-22 DIAGNOSIS — I5023 Acute on chronic systolic (congestive) heart failure: Secondary | ICD-10-CM | POA: Diagnosis not present

## 2016-11-22 DIAGNOSIS — Z952 Presence of prosthetic heart valve: Secondary | ICD-10-CM

## 2016-11-22 DIAGNOSIS — J449 Chronic obstructive pulmonary disease, unspecified: Secondary | ICD-10-CM | POA: Diagnosis not present

## 2016-11-22 DIAGNOSIS — I130 Hypertensive heart and chronic kidney disease with heart failure and stage 1 through stage 4 chronic kidney disease, or unspecified chronic kidney disease: Secondary | ICD-10-CM | POA: Diagnosis not present

## 2016-11-22 DIAGNOSIS — Z5181 Encounter for therapeutic drug level monitoring: Secondary | ICD-10-CM

## 2016-11-22 DIAGNOSIS — E1122 Type 2 diabetes mellitus with diabetic chronic kidney disease: Secondary | ICD-10-CM | POA: Diagnosis not present

## 2016-11-22 LAB — CUP PACEART REMOTE DEVICE CHECK
Battery Remaining Longevity: 20 mo
Battery Voltage: 2.92 V
Brady Statistic AP VP Percent: 83.19 %
Brady Statistic AP VS Percent: 0 %
Brady Statistic AS VP Percent: 16.78 %
Brady Statistic AS VS Percent: 0.03 %
Brady Statistic RA Percent Paced: 82.23 %
Brady Statistic RV Percent Paced: 99 %
Date Time Interrogation Session: 20180226173204
HighPow Impedance: 34 Ohm
HighPow Impedance: 40 Ohm
Implantable Lead Implant Date: 20040521
Implantable Lead Implant Date: 20071218
Implantable Lead Implant Date: 20160516
Implantable Lead Location: 753858
Implantable Lead Location: 753859
Implantable Lead Location: 753860
Implantable Lead Model: 5076
Implantable Lead Model: 6947
Implantable Pulse Generator Implant Date: 20160516
Lead Channel Impedance Value: 285 Ohm
Lead Channel Impedance Value: 304 Ohm
Lead Channel Impedance Value: 342 Ohm
Lead Channel Impedance Value: 342 Ohm
Lead Channel Impedance Value: 361 Ohm
Lead Channel Impedance Value: 399 Ohm
Lead Channel Impedance Value: 418 Ohm
Lead Channel Impedance Value: 532 Ohm
Lead Channel Impedance Value: 608 Ohm
Lead Channel Impedance Value: 608 Ohm
Lead Channel Impedance Value: 646 Ohm
Lead Channel Impedance Value: 646 Ohm
Lead Channel Impedance Value: 703 Ohm
Lead Channel Pacing Threshold Amplitude: 0.625 V
Lead Channel Pacing Threshold Amplitude: 1.5 V
Lead Channel Pacing Threshold Amplitude: 5 V
Lead Channel Pacing Threshold Pulse Width: 0.4 ms
Lead Channel Pacing Threshold Pulse Width: 0.4 ms
Lead Channel Pacing Threshold Pulse Width: 1.5 ms
Lead Channel Sensing Intrinsic Amplitude: 1.625 mV
Lead Channel Sensing Intrinsic Amplitude: 1.625 mV
Lead Channel Sensing Intrinsic Amplitude: 10.5 mV
Lead Channel Sensing Intrinsic Amplitude: 10.5 mV
Lead Channel Setting Pacing Amplitude: 2 V
Lead Channel Setting Pacing Amplitude: 2.5 V
Lead Channel Setting Pacing Amplitude: 2.5 V
Lead Channel Setting Pacing Pulse Width: 0.6 ms
Lead Channel Setting Pacing Pulse Width: 1.5 ms
Lead Channel Setting Sensing Sensitivity: 0.3 mV

## 2016-11-22 LAB — POCT INR: INR: 2.1

## 2016-11-23 ENCOUNTER — Other Ambulatory Visit: Payer: Self-pay | Admitting: Licensed Clinical Social Worker

## 2016-11-23 ENCOUNTER — Other Ambulatory Visit: Payer: Self-pay

## 2016-11-23 DIAGNOSIS — I130 Hypertensive heart and chronic kidney disease with heart failure and stage 1 through stage 4 chronic kidney disease, or unspecified chronic kidney disease: Secondary | ICD-10-CM | POA: Diagnosis not present

## 2016-11-23 DIAGNOSIS — N184 Chronic kidney disease, stage 4 (severe): Secondary | ICD-10-CM | POA: Diagnosis not present

## 2016-11-23 DIAGNOSIS — I429 Cardiomyopathy, unspecified: Secondary | ICD-10-CM | POA: Diagnosis not present

## 2016-11-23 DIAGNOSIS — J449 Chronic obstructive pulmonary disease, unspecified: Secondary | ICD-10-CM | POA: Diagnosis not present

## 2016-11-23 DIAGNOSIS — E1122 Type 2 diabetes mellitus with diabetic chronic kidney disease: Secondary | ICD-10-CM | POA: Diagnosis not present

## 2016-11-23 DIAGNOSIS — I5023 Acute on chronic systolic (congestive) heart failure: Secondary | ICD-10-CM | POA: Diagnosis not present

## 2016-11-23 NOTE — Patient Outreach (Signed)
  [facility]) Care Management  11/23/2016  [name] [name] September 18, 1932 [id number]   Received call from [place] working with patient. He states that patient has had lots of medical activity in the last month.  He states that patient was admitted recently and had some ED visits as well.  He states that the patient also has home health nurse and PT involved.  After we discussed patient case, we discussed possible community nurse involvement due to ED usage and recent admission to assess patient.  Advised him I would call patient to assess.     Telephone call to patient after receiving a call from Veterinary surgeon.  Patient reports it has taken her a long time to get over having the flu and now with some heart issues.  Patient reports she still feels weak but some better.  Discussed with patient the possibility of community nurse coming to assess and help with getting patient back on the right track.  Patient agreeable to community nurse involvement to get back on track.  Plan: RN Health Coach will refer patient to community nurse for assessment.    Jone Baseman, RN, MSN [place] [phone]

## 2016-11-23 NOTE — Patient Outreach (Signed)
Assessment:  CSW spoke via phone with client. CSW verified client identity. CSW received verbal permission from client on 11/23/16 for CSW to speak with client about current client needs. Client said she had her prescribed medications and is taking medications as prescribed. Client sees Dr. [name] as primary care doctor. Client said she has a scheduled appointment with Dr. [name] on 12/04/16.  Client receives support with [place], Jon Billings RN. Client does live alone. She uses a cane or walker to help her with ambulation. Client has some family support in transporting client to and from client's scheduled medical appointments. CSW and client spoke of clent  care plan. CSW encouraged client to communicate with CSW in next 30 days to discuss community resources for transport assistance for client. Client said she is taking prescribed pain medication as scheduled.  Client said that pain medication prescribed is helpful to client in managing client pain. Client is eating adequately. CSW has informed client previously about [employer] transport agency and possible transport assistance for client through that agency. CSW had informed client previously about [place] and possible transpot assstnce for client through that agency. Client said she has some support from her niece who lives nearby. Client sees Dr. Luan Pulling, pulmonologist, as scheduled. Client said that RN from [place] is scheduled to begin making home visits for nursing support for client in the home. Client also said that [place] Therapist from [facility] is scheduled to begin conducting in home physical therapy sessions with client. CSW spoke with client about [facility] program support services. CSW encouraged client to call CSW at [phone] as needed to discuss social work needs of client.   Plan:  Client to communiucate with CSW in next 30 days to discuss community resources for  transport assistance for client.  CSW to call client in 4 weeks to assess client needs at that time.  [name].Jarrah Babich MSW, LCSW Licensed Clinical Social Worker [facility] Care Management [phone]

## 2016-11-24 ENCOUNTER — Other Ambulatory Visit: Payer: Self-pay | Admitting: Internal Medicine

## 2016-11-24 ENCOUNTER — Other Ambulatory Visit: Payer: Self-pay | Admitting: *Deleted

## 2016-11-24 NOTE — Patient Outreach (Signed)
RN CM called pt for transition of care week 1, pt hospitalized 2/22-2/23 for exacerbation CHF and was previously active with [facility] RN health coach.  Spoke with pt, HIPAA verified, pt reports it would be better for RN CM to see pt medications in person, RN CM read off all medications over the phone and pt states she has everything except her inhaler which she will be getting from pharmacy tomorrow.  Pt is weighing daily, Rn CM reviewed CHF action plan and upcoming appointments.  Pt states she calls "heart doctor" for CHF action plan.  [facility] CM Care Plan Problem One   Flowsheet Row Most Recent Value  Care Plan Problem One  Heart Failure knowledge deficit  Role Documenting the Problem One  Care Management Coordinator  Care Plan for Problem One  Active  [facility]N Long Term Goal (31-90 days)  Patient will become familiar with heart failure disease process and be able to verbalize heart failure zones for self management wtihin 90 days.    [facility] Long Term Goal Start Date  11/03/16 Barrie Folk continued]  Interventions for Problem One Long Term Goal  RN CM reviewed CHF action plan and zones and which MD to call for issues (cardiologist per pt)      PLAN See pt for home visit next week Continue weekly transition of care calls  Jacqlyn Larsen [facility], [place] Coordinator [phone]

## 2016-11-27 DIAGNOSIS — N184 Chronic kidney disease, stage 4 (severe): Secondary | ICD-10-CM | POA: Diagnosis not present

## 2016-11-27 DIAGNOSIS — I429 Cardiomyopathy, unspecified: Secondary | ICD-10-CM | POA: Diagnosis not present

## 2016-11-27 DIAGNOSIS — E1122 Type 2 diabetes mellitus with diabetic chronic kidney disease: Secondary | ICD-10-CM | POA: Diagnosis not present

## 2016-11-27 DIAGNOSIS — I5023 Acute on chronic systolic (congestive) heart failure: Secondary | ICD-10-CM | POA: Diagnosis not present

## 2016-11-27 DIAGNOSIS — I130 Hypertensive heart and chronic kidney disease with heart failure and stage 1 through stage 4 chronic kidney disease, or unspecified chronic kidney disease: Secondary | ICD-10-CM | POA: Diagnosis not present

## 2016-11-27 DIAGNOSIS — J449 Chronic obstructive pulmonary disease, unspecified: Secondary | ICD-10-CM | POA: Diagnosis not present

## 2016-11-29 ENCOUNTER — Encounter (HOSPITAL_COMMUNITY): Payer: Medicare Other | Attending: Hematology & Oncology | Admitting: Oncology

## 2016-11-29 ENCOUNTER — Encounter (HOSPITAL_COMMUNITY): Payer: Self-pay | Admitting: Oncology

## 2016-11-29 ENCOUNTER — Encounter (HOSPITAL_BASED_OUTPATIENT_CLINIC_OR_DEPARTMENT_OTHER): Payer: Medicare Other

## 2016-11-29 VITALS — BP 145/53 | HR 71 | Resp 16 | Ht 67.0 in | Wt 221.0 lb

## 2016-11-29 DIAGNOSIS — Z952 Presence of prosthetic heart valve: Secondary | ICD-10-CM

## 2016-11-29 DIAGNOSIS — D649 Anemia, unspecified: Secondary | ICD-10-CM | POA: Diagnosis not present

## 2016-11-29 DIAGNOSIS — D631 Anemia in chronic kidney disease: Secondary | ICD-10-CM

## 2016-11-29 DIAGNOSIS — E1122 Type 2 diabetes mellitus with diabetic chronic kidney disease: Secondary | ICD-10-CM | POA: Diagnosis not present

## 2016-11-29 DIAGNOSIS — I5023 Acute on chronic systolic (congestive) heart failure: Secondary | ICD-10-CM | POA: Diagnosis not present

## 2016-11-29 DIAGNOSIS — I130 Hypertensive heart and chronic kidney disease with heart failure and stage 1 through stage 4 chronic kidney disease, or unspecified chronic kidney disease: Secondary | ICD-10-CM | POA: Diagnosis not present

## 2016-11-29 DIAGNOSIS — N184 Chronic kidney disease, stage 4 (severe): Secondary | ICD-10-CM | POA: Diagnosis not present

## 2016-11-29 DIAGNOSIS — C184 Malignant neoplasm of transverse colon: Secondary | ICD-10-CM | POA: Diagnosis not present

## 2016-11-29 LAB — BASIC METABOLIC PANEL
Anion gap: 6 (ref 5–15)
BUN: 38 mg/dL — ABNORMAL HIGH (ref 6–20)
CO2: 29 mmol/L (ref 22–32)
Calcium: 9.4 mg/dL (ref 8.9–10.3)
Chloride: 101 mmol/L (ref 101–111)
Creatinine, Ser: 1.41 mg/dL — ABNORMAL HIGH (ref 0.44–1.00)
GFR calc Af Amer: 39 mL/min — ABNORMAL LOW (ref >60)
GFR calc non Af Amer: 34 mL/min — ABNORMAL LOW (ref >60)
Glucose, Bld: 100 mg/dL — ABNORMAL HIGH (ref 65–99)
Potassium: 4.4 mmol/L (ref 3.5–5.1)
Sodium: 136 mmol/L (ref 135–145)

## 2016-11-29 LAB — IRON AND TIBC
Iron: 62 ug/dL (ref 28–170)
Saturation Ratios: 22 % (ref 10.4–31.8)
TIBC: 281 ug/dL (ref 250–450)
UIBC: 219 ug/dL

## 2016-11-29 LAB — CBC
HCT: 30.1 % — ABNORMAL LOW (ref 36.0–46.0)
Hemoglobin: 9.8 g/dL — ABNORMAL LOW (ref 12.0–15.0)
MCH: 30.4 pg (ref 26.0–34.0)
MCHC: 32.6 g/dL (ref 30.0–36.0)
MCV: 93.5 fL (ref 78.0–100.0)
Platelets: 182 10*3/uL (ref 150–400)
RBC: 3.22 MIL/uL — ABNORMAL LOW (ref 3.87–5.11)
RDW: 16.1 % — ABNORMAL HIGH (ref 11.5–15.5)
WBC: 5.8 10*3/uL (ref 4.0–10.5)

## 2016-11-29 MED ORDER — EPOETIN ALFA 40000 UNIT/ML IJ SOLN
INTRAMUSCULAR | Status: AC
  Filled 2016-11-29: qty 1

## 2016-11-29 MED ORDER — EPOETIN ALFA 40000 UNIT/ML IJ SOLN
40000.0000 [IU] | Freq: Once | INTRAMUSCULAR | Status: AC
  Administered 2016-11-29: 40000 [IU] via SUBCUTANEOUS

## 2016-11-29 NOTE — Progress Notes (Signed)
[name] Nakayama, MD [address], [address] [place] [address]  Anemia, unspecified type - Plan: Basic metabolic panel, Iron and TIBC, CBC with Differential, Basic metabolic panel, Iron and TIBC, Ferritin, Basic metabolic panel  CKD (chronic kidney disease), stage IV (HCC) - Plan: Basic metabolic panel, Iron and TIBC, CBC with Differential, Basic metabolic panel, Basic metabolic panel  CURRENT THERAPY: Procrit 40,000 units every 3 weeks  INTERVAL HISTORY: [name] [name] 83 y.o. 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  She is doing very well.  She denies any blood in her stools or black stools.  Her appetite is good.  Her weight stable.  She otherwise denies any complaints.  She does note that her Procrit is expensive I suspect this is secondary to being a new year and she has not met her deductible/maximum-out-of-pocket expense this year.  Hematologically, the patient denies any complaints.  Review of Systems  Constitutional: Negative.  Negative for chills, fever and weight loss.  HENT: Negative.   Eyes: Negative.   Respiratory: Negative.  Negative for cough.   Cardiovascular: Negative.  Negative for chest pain.  Gastrointestinal: Negative.  Negative for blood in stool, constipation, diarrhea, melena, nausea and vomiting.  Genitourinary: Negative.   Musculoskeletal: Negative.   Skin: Negative.   Neurological: Negative.  Negative for weakness.  Endo/Heme/Allergies: Negative.   Psychiatric/Behavioral: Negative.     Past Medical History:  Diagnosis Date  . Adenomatous polyp of colon 12/16/2002   Dr. Collier Salina [name]/[facility]  . Allergy   . Cataract   . CHB (complete heart block) ([place]) 10/07/2014      . CHF (congestive heart failure) ([place])       . Chronic kidney disease, stage I 2006  . Constipation       . COPD (chronic obstructive pulmonary disease) ([place])       . DJD (degenerative joint disease) of lumbar spine   . DM (diabetes mellitus) ([place]) 2006  . Esophageal reflux   . Glaucoma   . Gout       . Heart murmur   . Hyperlipemia   . IDA (iron deficiency anemia)    Parenteral iron/Dr. Tressie [name]  . Insomnia       . Non-ischemic cardiomyopathy ([place])    a. s/p MDT CRTD  . Obesity, unspecified   . Other and unspecified hyperlipidemia   . Oxygen deficiency 2014   nocturnal;  . Spondylolisthesis   . Spondylosis   . Thyroid cancer ([place])    remote thyroidectomy, no recurrence, pt denies in 2016 thyroid cancer  . Unspecified hypothyroidism       . 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 Vertigo         Past Surgical History:  Procedure Laterality Date  . AORTIC AND MITRAL VALVE REPLACEMENT  2001  . BI-VENTRICULAR IMPLANTABLE CARDIOVERTER DEFIBRILLATOR UPGRADE N/A 01/18/2015   Procedure: BI-VENTRICULAR IMPLANTABLE CARDIOVERTER DEFIBRILLATOR UPGRADE;  Surgeon: [name] Lance, MD;  Location: [facility] CATH LAB;  Service: Cardiovascular;  Laterality: N/A;  . CARDIAC CATHETERIZATION    . CARDIAC VALVE REPLACEMENT    . CARDIOVERSION N/A 11/13/2016   Procedure: CARDIOVERSION;  Surgeon: [name] Klein, MD;  Location: [place];  Service:  Cardiovascular;  Laterality: N/A;  . COLONOSCOPY  06/12/2007   [name] [name]- Normal rectum/Normal colon  . COLONOSCOPY  12/16/02   small hemorrhoids  . COLONOSCOPY  06/20/2012   Procedure: COLONOSCOPY;  Surgeon: [name] Dolin, MD;  [facility];  Service: Endoscopy;  Laterality: N/A;  10:45  . defibrillator implanted 2006    . DOPPLER ECHOCARDIOGRAPHY  2012  . DOPPLER ECHOCARDIOGRAPHY  05,06,07,08,09,2011  . ESOPHAGOGASTRODUODENOSCOPY  06/12/2007   [name] [name]- normal esophagus, small hiatal hernia, otherwise normal  stomach, D1, D2  . EYE SURGERY Right 2005  . EYE SURGERY Left 2006  . ICD LEAD REMOVAL Left 02/08/2015   Procedure: ICD LEAD REMOVAL;  Surgeon: [name] Lance, MD;  Location: [facility] OR;  Service: Cardiovascular;  Laterality: Left;  "Will plan extraction and insertion of a BiV PM"  **Dr. Roxan Hockey backing up case**  . IMPLANTABLE CARDIOVERTER DEFIBRILLATOR (ICD) GENERATOR CHANGE Left 02/08/2015   Procedure: ICD GENERATOR CHANGE;  Surgeon: [name] Lance, MD;  Location: [place];  Service: Cardiovascular;  Laterality: Left;  . INSERT / REPLACE / REMOVE PACEMAKER    . PACEMAKER INSERTION  May 2016  . pacemaker placed  2004  . right breast cyst removed     benign   . right cataract removed     2005  . TEE WITHOUT CARDIOVERSION N/A 11/13/2016   Procedure: TRANSESOPHAGEAL ECHOCARDIOGRAM (TEE);  Surgeon: [name] Klein, MD;  Location: [facility] ENDOSCOPY;  Service: Cardiovascular;  Laterality: N/A;  . THYROIDECTOMY      Family History  Problem Relation Age of Onset  . Cancer Mother     behind pancres  . Heart disease Father   . Heart disease Sister     Heart attack  . Heart disease Brother   . Diabetes Brother   . Heart disease Brother   . Diabetes Brother   . Hypertension Brother   . Cancer Brother     lung    Social History   Social History  . Marital status: Widowed    Spouse name: N/A  . Number of children: 0  . Years of education: N/A   Occupational History  . retired Retired   Social History Main Topics  . Smoking status: Former Smoker    Packs/day: 0.75    Years: 40.00    Types: Cigarettes    Quit date: 03/08/1987  . Smokeless tobacco: Never Used  . Alcohol use No  . Drug use: No  . Sexual activity: Not Currently   Other Topics Concern  . None   Social History Narrative   From [name] [name] (2004)     PHYSICAL EXAMINATION  ECOG PERFORMANCE STATUS: 1 - Symptomatic but completely ambulatory  Vitals:   11/29/16 1115  BP: (!) 145/53  Pulse: 71  Resp: 16      GENERAL:alert, no distress, well nourished, well developed, comfortable, cooperative, obese, smiling and unaccompanied, in wheelchair SKIN: skin color, texture, turgor are normal, no rashes or significant lesions HEAD: Normocephalic, No masses, lesions, tenderness or abnormalities EYES: normal, EOMI, Conjunctiva are pink and non-injected EARS: External ears normal OROPHARYNX:lips, buccal mucosa, and tongue normal and mucous membranes are moist  NECK: supple, trachea midline LYMPH:  no palpable lymphadenopathy BREAST:not examined LUNGS: clear to auscultation bilaterally without wheezes, rales, rhonchi. HEART: regular rate & rhythm and systolic click.  No S3/S4. ABDOMEN:abdomen soft, non-tender and normal bowel sounds BACK: Back symmetric, no curvature. EXTREMITIES:less then 2 second capillary refill, no joint deformities, effusion, or inflammation, no skin discoloration, no cyanosis  NEURO: alert & oriented x 3 with fluent speech, no focal motor/sensory deficits, in wheelchair.  LABORATORY DATA: CBC    Component Value Date/Time   WBC 6.8 11/16/2016 1727   RBC 3.33 (L) 11/16/2016 1727   HGB 10.2 (L) 11/16/2016 1727   HCT 31.2 (L) 11/16/2016 1727   PLT 180 11/16/2016 1727   MCV 93.7 11/16/2016 1727   MCH 30.6 11/16/2016 1727   MCHC 32.7 11/16/2016 1727   RDW 16.1 (H) 11/16/2016 1727   LYMPHSABS 1.2 11/16/2016 1727   MONOABS 0.5 11/16/2016 1727   EOSABS 0.0 11/16/2016 1727   BASOSABS 0.0 11/16/2016 1727      Chemistry      Component Value Date/Time   NA 136 11/17/2016 0428   NA 141 10/16/2016 1213   K 4.1 11/17/2016 0428   CL 100 (L) 11/17/2016 0428   CO2 28 11/17/2016 0428   BUN 45 (H) 11/17/2016 0428   BUN 74 (H) 10/16/2016 1213   CREATININE 1.51 (H) 11/17/2016 0428   CREATININE 1.54 (H) 11/03/2016 1602      Component Value Date/Time   CALCIUM 9.2 11/17/2016 0428   CALCIUM 9.5 06/12/2010 0459   ALKPHOS 59 11/16/2016 1727   AST 27 11/16/2016 1727   ALT 17  11/16/2016 1727   BILITOT 0.3 11/16/2016 1727   BILITOT 0.3 10/16/2016 1213        PENDING LABS:   RADIOGRAPHIC STUDIES:  Dg Chest 2 View  Result Date: 11/16/2016 CLINICAL DATA:  Acute onset of shortness of breath. Initial encounter. EXAM: CHEST  2 VIEW COMPARISON:  Chest radiograph performed 10/09/2016 FINDINGS: The lungs are well-aerated. Vascular congestion is noted. There is stable nodularity at the right hilum, largely unchanged from 2005. Mildly increased interstitial markings may reflect minimal interstitial edema. There is no evidence of pleural effusion or pneumothorax. The heart is mildly enlarged. A pacemaker/AICD is noted at the left chest wall, with leads ending at the right atrium, right ventricle and coronary sinus. No acute osseous abnormalities are seen. Minimal chronic anterior wedging is noted at the mid thoracic spine. IMPRESSION: Vascular congestion and mild cardiomegaly. Mildly increased interstitial markings may reflect minimal interstitial edema. Electronically Signed   By: Garald Balding M.D.   On: 11/16/2016 17:30   Ct Angio Chest Pe W And/or Wo Contrast  Result Date: 11/16/2016 CLINICAL DATA:  SOB x 2 days, elevated troponin, pacer, chf, reduced dose due to gfr of 33 EXAM: CT ANGIOGRAPHY CHEST WITH CONTRAST TECHNIQUE: Multidetector CT imaging of the chest was performed using the standard protocol during bolus administration of intravenous contrast. Multiplanar CT image reconstructions and MIPs were obtained to evaluate the vascular anatomy. CONTRAST:  50 mL Isovue 370 IV COMPARISON:  None. FINDINGS: Cardiovascular: Right arm IV contrast administration. Left subclavian transvenous pacing AICD leads extend into the coronary sinus, right atrium and right ventricle. SVC remains patent. Right atrial enlargement. Some reflux of contrast from the right atrium into the IVC. Right ventricle is nondilated. Dilated central pulmonary arteries. Satisfactory opacification of pulmonary  arteries noted, and there is no evidence of pulmonary emboli. Patent pulmonary veins drain into the left atrium. There is mild left atrial enlargement. Previous mitral valve replacement surgery. Scattered coronary calcifications. Previous aortic valve replacement surgery. Adequate contrast opacification of the thoracic aorta with no evidence of dissection, aneurysm, or stenosis. There is dilated ascending aorta up to 4.2 cm diameter. Classic 3 vessel Brachiocephalic arch anatomy without proximal stenosis. Moderate atheromatous calcifications throughout the thoracic aorta. Mediastinum/Nodes: No pericardial  effusion. No adenopathy localized. Lungs/Pleura: No pleural effusion. No pneumothorax. Lungs are clear. Upper Abdomen: Multiple cystic lesions in the enlarged spleen with associated curvilinear coarse calcifications, as before. No acute findings. Musculoskeletal: Mild anterior compression deformity of T7 vertebral body, visible on previous chest radiographs. Previous median sternotomy. No acute fracture or worrisome bone lesion. Review of the MIP images confirms the above findings. IMPRESSION: 1. Negative for acute PE or thoracic aortic dissection. 2. Extensive coronary and aortic atheromatous calcifications. 3. 4.2 cm ascending aortic aneurysm. Recommend annual imaging followup by CTA or MRA. This recommendation follows 2010 ACCF/AHA/AATS/ACR/ASA/SCA/SCAI/SIR/STS/SVM Guidelines for the Diagnosis and Management of Patients with Thoracic Aortic Disease. Circulation. 2010; 121: [id number] Electronically Signed   By: Lucrezia Europe M.D.   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  Labs today: CBC, BMET, iron/TIBC, Ferritin.  I personally  reviewed and went over laboratory results with the patient.  The results are noted within this dictation.  Labs every 3 weeks: CBC  Procrit 40,000 units every 3 weeks for a HGB < 11 g/dL.  Labs in 3 months: CBC diff, BMET, iron/TIBC, ferritin.  Return in 3 months for follow-up.   ORDERS PLACED FOR THIS ENCOUNTER: Orders Placed This Encounter  Procedures  . Basic metabolic panel  . Iron and TIBC  . CBC with Differential  . Basic metabolic panel  . Iron and TIBC  . Ferritin    MEDICATIONS PRESCRIBED THIS ENCOUNTER: No orders of the defined types were placed in this encounter.   THERAPY PLAN:  Continue with ESA therapy for chronic renal disease with follow-up with nephrologist and primary care provider as directed.  All questions were answered. The patient knows to call the clinic with any problems, questions or concerns. We can certainly see the patient much sooner if necessary.  Patient and plan discussed with Dr. Twana First and she is in agreement with the aforementioned.   This note is electronically signed by: Doy Mince 11/29/2016 11:54 AM

## 2016-11-29 NOTE — Patient Instructions (Signed)
[place] at [facility] Discharge Instructions  RECOMMENDATIONS MADE BY THE CONSULTANT AND ANY TEST RESULTS WILL BE SENT TO YOUR REFERRING PHYSICIAN.  You were seen today by Kirby Crigler PA-C. Labs today and possible procrit injection. Labs and Procrit every 3 weeks. Labs and follow up in 3 months.  Thank you for choosing [place] at [facility] to provide your oncology and hematology care.  To afford each patient quality time with our provider, please arrive at least 15 minutes before your scheduled appointment time.    If you have a lab appointment with the [facility] please come in thru the  Main Entrance and check in at the main information desk  You need to re-schedule your appointment should you arrive 10 or more minutes late.  We strive to give you quality time with our providers, and arriving late affects you and other patients whose appointments are after yours.  Also, if you no show three or more times for appointments you may be dismissed from the clinic at the providers discretion.     Again, thank you for choosing [facility].  Our hope is that these requests will decrease the amount of time that you wait before being seen by our physicians.       _____________________________________________________________  Should you have questions after your visit to [facility], please contact our office at [phone] between the hours of 8:30 a.m. and 4:30 p.m.  Voicemails left after 4:30 p.m. will not be returned until the following business day.  For prescription refill requests, have your pharmacy contact our office.       Resources For Cancer Patients and their Caregivers ? American Cancer Society: Can assist with transportation, wigs, general needs, runs Look Good Feel Better.        (740)871-2234 ? Cancer Care: Provides financial assistance, online support groups, medication/co-pay assistance.   1-800-813-HOPE 930-727-3120) ? [place] Assists [place] cancer patients and their families through emotional , educational and financial support.  747-365-5162 ? [place] DSS Where to apply for food stamps, Medicaid and utility assistance. 973-216-1784 ? [facility]: Transportation to medical appointments. 986-102-5115 ? Social Security Administration: May apply for disability if have a Stage IV cancer. 864 772 7602 226-453-0571 ? [employer], Disability and Transit Services: Assists with nutrition, care and transit needs. [place] Support Programs: @10RELATIVEDAYS @ > Cancer Support Group  2nd Tuesday of the month 1pm-2pm, Journey Room  > Creative Journey  3rd Tuesday of the month 1130am-1pm, Journey Room  > Look Good Feel Better  1st Wednesday of the month 10am-12 noon, Journey Room (Call [place] to register [phone])

## 2016-11-29 NOTE — Progress Notes (Signed)
Pt given Procrit injection in right abdomen. Pt tolerated well. Pt stable and discharged home in wheel chair.

## 2016-11-29 NOTE — Assessment & Plan Note (Addendum)
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  Labs today: CBC, BMET, iron/TIBC, Ferritin.  I personally reviewed and went over laboratory results with the patient.  The results are noted within this dictation.  Labs every 3 weeks: CBC  Procrit 40,000 units every 3 weeks for a HGB < 11 g/dL.  Labs in 3 months: CBC diff, BMET, iron/TIBC, ferritin.  Return in 3 months for follow-up.

## 2016-11-30 ENCOUNTER — Encounter: Payer: Self-pay | Admitting: *Deleted

## 2016-11-30 ENCOUNTER — Ambulatory Visit (INDEPENDENT_AMBULATORY_CARE_PROVIDER_SITE_OTHER): Payer: Medicare Other | Admitting: *Deleted

## 2016-11-30 ENCOUNTER — Other Ambulatory Visit: Payer: Self-pay | Admitting: *Deleted

## 2016-11-30 DIAGNOSIS — I130 Hypertensive heart and chronic kidney disease with heart failure and stage 1 through stage 4 chronic kidney disease, or unspecified chronic kidney disease: Secondary | ICD-10-CM | POA: Diagnosis not present

## 2016-11-30 DIAGNOSIS — I429 Cardiomyopathy, unspecified: Secondary | ICD-10-CM | POA: Diagnosis not present

## 2016-11-30 DIAGNOSIS — Z952 Presence of prosthetic heart valve: Secondary | ICD-10-CM

## 2016-11-30 DIAGNOSIS — I5023 Acute on chronic systolic (congestive) heart failure: Secondary | ICD-10-CM | POA: Diagnosis not present

## 2016-11-30 DIAGNOSIS — J449 Chronic obstructive pulmonary disease, unspecified: Secondary | ICD-10-CM | POA: Diagnosis not present

## 2016-11-30 DIAGNOSIS — N184 Chronic kidney disease, stage 4 (severe): Secondary | ICD-10-CM | POA: Diagnosis not present

## 2016-11-30 DIAGNOSIS — E1122 Type 2 diabetes mellitus with diabetic chronic kidney disease: Secondary | ICD-10-CM | POA: Diagnosis not present

## 2016-11-30 DIAGNOSIS — Z5181 Encounter for therapeutic drug level monitoring: Secondary | ICD-10-CM

## 2016-11-30 LAB — POCT INR: INR: 2.2

## 2016-11-30 NOTE — Patient Outreach (Signed)
[facility]) Care Management   11/30/2016  [name] [name] 1931-11-20 [id number]  [name] [name] is an 83 y.o. female  Subjective: Initial home visit with pt, HIPAA verified, pt reports she lives with nephew and family members assist her and transport her to appointments.  Pt weighs daily and records, states " diabetes under control, I'm not on medication for this"   Pt reports " I look after my own medicine and prefill med box"  Pt states home health RN and PT working with her.  Pt is attending all MD appointments.  Objective:   Vitals:   11/30/16 1313  BP: 118/60  Pulse: 74  Resp: 16  SpO2: 95%  Weight: 217 lb (98.4 kg)  Height: 1.702 m (5\' 7" )  CBG 108 Pt checks CBG several times weekly, is not on medication for diabetes  ROS  Physical Exam  Constitutional: She is oriented to person, place, and time. She appears well-developed and well-nourished.  HENT:  Head: Normocephalic.  Neck: Normal range of motion. Neck supple.  Cardiovascular: Normal rate and regular rhythm.   Respiratory: Effort normal and breath sounds normal.  GI: Soft. Bowel sounds are normal.  Musculoskeletal: Normal range of motion. She exhibits edema.  1+ edema lower extremites bil  Neurological: She is alert and oriented to person, place, and time.  Skin: Skin is warm and dry.  Psychiatric: She has a normal mood and affect. Her behavior is normal. Judgment and thought content normal.    Encounter Medications:   Outpatient Encounter Prescriptions as of 11/30/2016  Medication Sig Note  . acetaminophen (TYLENOL) 500 MG tablet Take 500 mg by mouth every 6 (six) hours as needed (pain).   Marland Kitchen albuterol (PROVENTIL) (2.5 MG/3ML) 0.083% nebulizer solution INHALE 1 VIAL VIA NEBULIZER THREE TIMES DAILY.   Marland Kitchen albuterol (VENTOLIN HFA) 108 (90 Base) MCG/ACT inhaler Inhale 2 puffs into the lungs every 6 (six) hours as needed for wheezing or shortness of breath.   . allopurinol (ZYLOPRIM) 100 MG tablet Take 1  tablet (100 mg total) by mouth daily.   Marland Kitchen azelastine (ASTELIN) 0.1 % nasal spray Place 2 sprays into both nostrils 2 (two) times daily. Use in each nostril as directed   . benzonatate (TESSALON) 100 MG capsule Take 1 capsule (100 mg total) by mouth 2 (two) times daily as needed for cough.   . cholecalciferol (VITAMIN D) 1000 UNITS tablet Take 1,000 Units by mouth daily.   Marland Kitchen COUMADIN 5 MG tablet Take 2 tablets daily (Patient taking differently: Take 10 mg by mouth every evening. Take 5mg  on Wednesdays and Saturdays and take 10mg  on all other days) 10/31/2016: BRAND ONLY  . digoxin (LANOXIN) 0.125 MG tablet Take 1 tablet (0.125 mg total) by mouth every other day. 10/31/2016: BRAND ONLY  . docusate sodium (COLACE) 100 MG capsule Take 300 mg by mouth at bedtime.   . fluticasone (FLONASE) 50 MCG/ACT nasal spray USE 2 SPRAYS IN EACH       NOSTRIL ONCE DAILY (Patient taking differently: USE 2 SPRAYS IN EACH       NOSTRIL ONCE DAILY AS NEEDED FOR CONGESTION)   . folic acid (FOLVITE) 1 MG tablet Take 1 tablet (1 mg total) by mouth daily.   . furosemide (LASIX) 40 MG tablet Take 1 tablet by mouth  daily   . gabapentin (NEURONTIN) 300 MG capsule Take 1 capsule (300 mg total) by mouth 2 (two) times daily. (Patient taking differently: Take 300 mg by mouth at bedtime. )   .  hydrALAZINE (APRESOLINE) 25 MG tablet Take 1 tablet (25 mg total) by mouth 3 (three) times daily.   Marland Kitchen HYDROcodone-acetaminophen (NORCO) 7.5-325 MG tablet Take 1 tablet by mouth every 6 (six) hours as needed for moderate pain (Must last 30 days.Do not drive or operate machinery while taking this medicine.).   Marland Kitchen isosorbide mononitrate (IMDUR) 60 MG 24 hr tablet take 1 tablet by mouth once daily   . losartan (COZAAR) 50 MG tablet Take 1 tablet by mouth  daily (Patient taking differently: Take 50 mg by mouth daily. )   . metolazone (ZAROXOLYN) 2.5 MG tablet Take 1 tablet (2.5 mg total) by mouth once a week. 11/16/2016: Patient took two doses 11/15/2016   . ondansetron (ZOFRAN) 4 MG tablet Take 1 tablet (4 mg total) by mouth daily as needed for nausea or vomiting.   [name] [name] (SYSTANE OP) Place 1 drop into both eyes daily as needed (dry eyes).   . potassium chloride SA (KLOR-CON M20) 20 MEQ tablet Take 1 tablet by mouth  daily (Patient taking differently: Take 20 mEq by mouth daily. Take 1 tablet by mouth  daily)   . pravastatin (PRAVACHOL) 20 MG tablet Take 1 tablet (20 mg total) by mouth daily.   . promethazine-dextromethorphan (PROMETHAZINE-DM) 6.25-15 MG/5ML syrup One teaspoon at bedtime, as needed, for excessive cough (Patient taking differently: Take 5 mLs by mouth at bedtime as needed for cough. as needed, for excessive cough)   . SYNTHROID 75 MCG tablet Take 1 tablet by mouth  daily   . TOPROL XL 100 MG 24 hr tablet Take 1 tablet (100 mg total) by mouth daily. Take with or immediately following a meal.   . [DISCONTINUED] ezetimibe-simvastatin (VYTORIN) 10-20 MG tablet Take 1 tablet by mouth 3  times weekly (Patient not taking: Reported on 11/30/2016)    Facility-Administered Encounter Medications as of 11/30/2016  Medication  . epoetin alfa (EPOGEN,PROCRIT) injection 40,000 Units    Functional Status:   In your present state of health, do you have any difficulty performing the following activities: 11/30/2016 11/17/2016  Hearing? N -  Vision? N -  Difficulty concentrating or making decisions? N -  Walking or climbing stairs? Y -  Dressing or bathing? N -  Doing errands, shopping? N N  Preparing Food and eating ? N -  Using the Toilet? N -  In the past six months, have you accidently leaked urine? N -  Do you have problems with loss of bowel control? N -  Managing your Medications? N -  Managing your Finances? N -  Housekeeping or managing your Housekeeping? Y -  Some recent data might be hidden    Fall/Depression Screening:    PHQ 2/9 Scores 11/30/2016 11/03/2016 10/13/2016 09/19/2016 08/08/2016 07/05/2016  04/24/2016  PHQ - 2 Score 1 0 0 0 0 0 2  PHQ- 9 Score - - - - - - 9    Assessment:  Pt using cane for ambulation, RN CM reviewed safety precautions, home health active with pt, gave [facility] calendar and reviewed, gave EMMI handouts related to HF, diabetic diet and breathing exercises. RN CM observed all medication bottles and reviewed with pt.  Pt has shower chair and takes her own shower, uses oxygen 2 liters at hs only, is able to cook light meals.  Pt has no children and relies on neices and nephews for assistance as needed.  Pt feels she is managing well at present but does feel reinforcement and reminders are helpful  for her.  RN CM faxed initial home visit and barrier letter to primary MD Dr. [name].  THN CM Care Plan Problem One   Flowsheet Row Most Recent Value  Care Plan Problem One  Heart Failure knowledge deficit  Role Documenting the Problem One  Care Management Coordinator  Care Plan for Problem One  Active  THN Long Term Goal (31-90 days)  Patient will become familiar with heart failure disease process and be able to verbalize heart failure zones for self management wtihin 90 days.    THN Long Term Goal Start Date  11/03/16 [goal continued]  Interventions for Problem One Long Term Goal  RN CM reviewed (from [facility] calendar) CHF action plan and zones and which MD to call for issues (cardiologist per pt)  THN CM Short Term Goal #1 (0-30 days)  knowledge deficit related to low sodium diet  THN CM Short Term Goal #1 Start Date  11/30/16  Interventions for Short Term Goal #1  RN CM reviewed foods high in sodium to limit, avoid, reviewed correlation to high sodium diet and fluid retention.  THN CM Short Term Goal #2 (0-30 days)  Decreased endurance  THN CM Short Term Goal #2 Start Date  11/30/16  Interventions for Short Term Goal #2  RNCM ask pt to continue working with home health PT and doing prescribed exercises,  gave EMMI handout HF- Rest, Exercise and blood pressure, reviewed energy  conservation.      Plan: continue weekly transition of care calls See pt for home visit in April  Jacqlyn Larsen [facility], [place] Coordinator [phone]

## 2016-12-01 ENCOUNTER — Ambulatory Visit: Payer: Self-pay

## 2016-12-04 ENCOUNTER — Ambulatory Visit: Payer: Medicare Other | Admitting: Family Medicine

## 2016-12-05 ENCOUNTER — Ambulatory Visit (INDEPENDENT_AMBULATORY_CARE_PROVIDER_SITE_OTHER): Payer: Medicare Other | Admitting: Orthopaedic Surgery

## 2016-12-05 VITALS — BP 118/70 | HR 75 | Temp 97.3°F | Ht 67.0 in | Wt 220.0 lb

## 2016-12-05 DIAGNOSIS — M7061 Trochanteric bursitis, right hip: Secondary | ICD-10-CM | POA: Diagnosis not present

## 2016-12-05 NOTE — Progress Notes (Signed)
PROCEDURE NOTE:  The patient request injection, verbal consent was obtained.  The right trochanteric area of the hip was prepped appropriately after time out was performed.   Sterile technique was observed and injection of 1 cc of Depo-Medrol 40 mg with several cc's of plain xylocaine. Anesthesia was provided by ethyl chloride and a 20-gauge needle was used to inject the hip area. The injection was tolerated well.  A band aid dressing was applied.  The patient was advised to apply ice later today and tomorrow to the injection sight as needed.  Encounter Diagnosis  Name Primary?  . Trochanteric bursitis, right hip Yes   Return in one month.  Continue present medicine.  She cannot take any NSAID as she is on Coumadin.  Electronically Signed Sanjuana Kava, MD 3/13/20182:34 PM

## 2016-12-06 ENCOUNTER — Encounter: Payer: Self-pay | Admitting: Family Medicine

## 2016-12-06 ENCOUNTER — Ambulatory Visit (INDEPENDENT_AMBULATORY_CARE_PROVIDER_SITE_OTHER): Payer: Medicare Other | Admitting: Family Medicine

## 2016-12-06 ENCOUNTER — Other Ambulatory Visit: Payer: Self-pay | Admitting: *Deleted

## 2016-12-06 VITALS — BP 136/60 | HR 84 | Temp 97.6°F | Resp 18 | Ht 67.0 in | Wt 224.0 lb

## 2016-12-06 DIAGNOSIS — N184 Chronic kidney disease, stage 4 (severe): Secondary | ICD-10-CM | POA: Diagnosis not present

## 2016-12-06 DIAGNOSIS — E611 Iron deficiency: Secondary | ICD-10-CM

## 2016-12-06 DIAGNOSIS — Z9981 Dependence on supplemental oxygen: Secondary | ICD-10-CM | POA: Diagnosis not present

## 2016-12-06 DIAGNOSIS — I130 Hypertensive heart and chronic kidney disease with heart failure and stage 1 through stage 4 chronic kidney disease, or unspecified chronic kidney disease: Secondary | ICD-10-CM | POA: Diagnosis not present

## 2016-12-06 DIAGNOSIS — I429 Cardiomyopathy, unspecified: Secondary | ICD-10-CM | POA: Diagnosis not present

## 2016-12-06 DIAGNOSIS — I255 Ischemic cardiomyopathy: Secondary | ICD-10-CM | POA: Diagnosis not present

## 2016-12-06 DIAGNOSIS — E785 Hyperlipidemia, unspecified: Secondary | ICD-10-CM | POA: Diagnosis not present

## 2016-12-06 DIAGNOSIS — I10 Essential (primary) hypertension: Secondary | ICD-10-CM | POA: Diagnosis not present

## 2016-12-06 DIAGNOSIS — K8010 Calculus of gallbladder with chronic cholecystitis without obstruction: Secondary | ICD-10-CM

## 2016-12-06 DIAGNOSIS — J449 Chronic obstructive pulmonary disease, unspecified: Secondary | ICD-10-CM | POA: Diagnosis not present

## 2016-12-06 DIAGNOSIS — I5023 Acute on chronic systolic (congestive) heart failure: Secondary | ICD-10-CM | POA: Diagnosis not present

## 2016-12-06 DIAGNOSIS — E890 Postprocedural hypothyroidism: Secondary | ICD-10-CM

## 2016-12-06 DIAGNOSIS — E1122 Type 2 diabetes mellitus with diabetic chronic kidney disease: Secondary | ICD-10-CM | POA: Diagnosis not present

## 2016-12-06 DIAGNOSIS — Z95810 Presence of automatic (implantable) cardiac defibrillator: Secondary | ICD-10-CM

## 2016-12-06 MED ORDER — GABAPENTIN 300 MG PO CAPS: 300.0000 mg | ORAL_CAPSULE | Freq: Two times a day (BID) | ORAL | 1 refills | Status: DC

## 2016-12-06 NOTE — Patient Instructions (Addendum)
F/u in June as before , call if you need me sooner  Fasting labs 1 week bwefore June visit  No splinter seen by 2 Docs, keep clean and do not pick skin  You are [facility] bETTER  Thank you  for choosing [facility]. We consider it a privelige to serve you.  Delivering excellent health care in a caring and  compassionate way is our goal.  Partnering with you,  so that together we can achieve this goal is our strategy.

## 2016-12-06 NOTE — Patient Outreach (Signed)
Telephone call to pt for transition of care week 3, spoke with pt, HIPAA verified, pt states she saw primary MD today and no changes made, received steroid injection yesterday for right hip pain, pt states home health continues and PT saw pt today, weight today 217 pounds, pt continues weighing daily, reports has all medications and taking as prescribed.    THN CM Care Plan Problem One   Flowsheet Row Most Recent Value  Care Plan Problem One  Heart Failure knowledge deficit  Role Documenting the Problem One  Care Management Coordinator  Care Plan for Problem One  Active  THN Long Term Goal (31-90 days)  Patient will become familiar with heart failure disease process and be able to verbalize heart failure zones for self management wtihin 90 days.    THN Long Term Goal Start Date  11/03/16 [goal continued]  Interventions for Problem One Long Term Goal  RN CM reviewed CHF action plan, importance of working with home health and continuing to do exercises, energy conservation  THN CM Short Term Goal #1 (0-30 days)  knowledge deficit related to low sodium diet  THN CM Short Term Goal #1 Start Date  11/30/16  Interventions for Short Term Goal #1  RN CM reinforced foods high in sodium to limit, avoid, reviewed correlation to high sodium diet and fluid retention.  THN CM Short Term Goal #2 (0-30 days)  Decreased endurance  THN CM Short Term Goal #2 Start Date  11/30/16  Interventions for Short Term Goal #2  RNCM reviewed alternating activity with rest and taking rest periods between activity, ask for assistance as needed.      PLAN Continue weekly transition of care calls  Jacqlyn Larsen [facility], [place] Coordinator [phone]

## 2016-12-07 ENCOUNTER — Ambulatory Visit (INDEPENDENT_AMBULATORY_CARE_PROVIDER_SITE_OTHER): Payer: Medicare Other | Admitting: *Deleted

## 2016-12-07 ENCOUNTER — Telehealth: Payer: Self-pay | Admitting: *Deleted

## 2016-12-07 DIAGNOSIS — N184 Chronic kidney disease, stage 4 (severe): Secondary | ICD-10-CM | POA: Diagnosis not present

## 2016-12-07 DIAGNOSIS — Z5181 Encounter for therapeutic drug level monitoring: Secondary | ICD-10-CM

## 2016-12-07 DIAGNOSIS — I429 Cardiomyopathy, unspecified: Secondary | ICD-10-CM | POA: Diagnosis not present

## 2016-12-07 DIAGNOSIS — Z952 Presence of prosthetic heart valve: Secondary | ICD-10-CM

## 2016-12-07 DIAGNOSIS — I5023 Acute on chronic systolic (congestive) heart failure: Secondary | ICD-10-CM | POA: Diagnosis not present

## 2016-12-07 DIAGNOSIS — J449 Chronic obstructive pulmonary disease, unspecified: Secondary | ICD-10-CM | POA: Diagnosis not present

## 2016-12-07 DIAGNOSIS — E1122 Type 2 diabetes mellitus with diabetic chronic kidney disease: Secondary | ICD-10-CM | POA: Diagnosis not present

## 2016-12-07 DIAGNOSIS — I130 Hypertensive heart and chronic kidney disease with heart failure and stage 1 through stage 4 chronic kidney disease, or unspecified chronic kidney disease: Secondary | ICD-10-CM | POA: Diagnosis not present

## 2016-12-07 LAB — POCT INR: INR: 3.6

## 2016-12-07 NOTE — Telephone Encounter (Signed)
Done.  See coumadin note. 

## 2016-12-07 NOTE — Telephone Encounter (Signed)
Sarah with [facility] called. INR  3.6  PT 43.0   Please call [phone]

## 2016-12-08 DIAGNOSIS — J449 Chronic obstructive pulmonary disease, unspecified: Secondary | ICD-10-CM | POA: Diagnosis not present

## 2016-12-08 DIAGNOSIS — I429 Cardiomyopathy, unspecified: Secondary | ICD-10-CM | POA: Diagnosis not present

## 2016-12-08 DIAGNOSIS — E1122 Type 2 diabetes mellitus with diabetic chronic kidney disease: Secondary | ICD-10-CM | POA: Diagnosis not present

## 2016-12-08 DIAGNOSIS — N184 Chronic kidney disease, stage 4 (severe): Secondary | ICD-10-CM | POA: Diagnosis not present

## 2016-12-08 DIAGNOSIS — I130 Hypertensive heart and chronic kidney disease with heart failure and stage 1 through stage 4 chronic kidney disease, or unspecified chronic kidney disease: Secondary | ICD-10-CM | POA: Diagnosis not present

## 2016-12-08 DIAGNOSIS — I5023 Acute on chronic systolic (congestive) heart failure: Secondary | ICD-10-CM | POA: Diagnosis not present

## 2016-12-11 DIAGNOSIS — N184 Chronic kidney disease, stage 4 (severe): Secondary | ICD-10-CM | POA: Diagnosis not present

## 2016-12-11 DIAGNOSIS — J449 Chronic obstructive pulmonary disease, unspecified: Secondary | ICD-10-CM | POA: Diagnosis not present

## 2016-12-11 DIAGNOSIS — I130 Hypertensive heart and chronic kidney disease with heart failure and stage 1 through stage 4 chronic kidney disease, or unspecified chronic kidney disease: Secondary | ICD-10-CM | POA: Diagnosis not present

## 2016-12-11 DIAGNOSIS — E1122 Type 2 diabetes mellitus with diabetic chronic kidney disease: Secondary | ICD-10-CM | POA: Diagnosis not present

## 2016-12-11 DIAGNOSIS — I5023 Acute on chronic systolic (congestive) heart failure: Secondary | ICD-10-CM | POA: Diagnosis not present

## 2016-12-11 DIAGNOSIS — I429 Cardiomyopathy, unspecified: Secondary | ICD-10-CM | POA: Diagnosis not present

## 2016-12-12 ENCOUNTER — Other Ambulatory Visit: Payer: Self-pay | Admitting: *Deleted

## 2016-12-12 NOTE — Patient Outreach (Signed)
Telephone call to patient for transition of care week 4, no answer to home phone or cell phone and no option to leave voicemail.  Pt called back and left voicemail.  RN CM tried again to reach pt with no answer to telephone and no option to leave voicemail.  PLAN See for for home visit beginning of April  Jacqlyn Larsen [facility], [place] Coordinator [phone]

## 2016-12-13 ENCOUNTER — Ambulatory Visit: Payer: Self-pay | Admitting: *Deleted

## 2016-12-14 ENCOUNTER — Telehealth: Payer: Self-pay | Admitting: *Deleted

## 2016-12-14 ENCOUNTER — Ambulatory Visit (INDEPENDENT_AMBULATORY_CARE_PROVIDER_SITE_OTHER): Payer: Medicare Other | Admitting: *Deleted

## 2016-12-14 DIAGNOSIS — Z5181 Encounter for therapeutic drug level monitoring: Secondary | ICD-10-CM

## 2016-12-14 DIAGNOSIS — I130 Hypertensive heart and chronic kidney disease with heart failure and stage 1 through stage 4 chronic kidney disease, or unspecified chronic kidney disease: Secondary | ICD-10-CM | POA: Diagnosis not present

## 2016-12-14 DIAGNOSIS — N184 Chronic kidney disease, stage 4 (severe): Secondary | ICD-10-CM | POA: Diagnosis not present

## 2016-12-14 DIAGNOSIS — I429 Cardiomyopathy, unspecified: Secondary | ICD-10-CM | POA: Diagnosis not present

## 2016-12-14 DIAGNOSIS — Z952 Presence of prosthetic heart valve: Secondary | ICD-10-CM

## 2016-12-14 DIAGNOSIS — E1122 Type 2 diabetes mellitus with diabetic chronic kidney disease: Secondary | ICD-10-CM | POA: Diagnosis not present

## 2016-12-14 DIAGNOSIS — J449 Chronic obstructive pulmonary disease, unspecified: Secondary | ICD-10-CM | POA: Diagnosis not present

## 2016-12-14 DIAGNOSIS — I5023 Acute on chronic systolic (congestive) heart failure: Secondary | ICD-10-CM | POA: Diagnosis not present

## 2016-12-14 LAB — POCT INR: INR: 2.2

## 2016-12-14 NOTE — Telephone Encounter (Signed)
Cathy with [facility] called in regards to patients INR. INR 2.2    Please call [phone].

## 2016-12-14 NOTE — Telephone Encounter (Signed)
Done.  See coumadin note. 

## 2016-12-16 NOTE — Assessment & Plan Note (Signed)
Resolved following overnight hospitalization in Feb, continued close cardiology follow up indicated. Denies exertional dyspnea and fatigue and on exam she has no signs of cardiac decompensation

## 2016-12-16 NOTE — Assessment & Plan Note (Signed)
Controlled, no change in medication  

## 2016-12-16 NOTE — Assessment & Plan Note (Signed)
Regularly checked and denies spontaneous malfunction, and has had no episodes of spontaneous defibrillation

## 2016-12-16 NOTE — Assessment & Plan Note (Signed)
Hyperlipidemia:Low fat diet discussed and encouraged.   Lipid Panel  Lab Results  Component Value Date   CHOL 165 10/16/2016   HDL 31 (L) 10/16/2016   LDLCALC 81 10/16/2016   TRIG 265 (H) 10/16/2016   CHOLHDL 5.1 10/14/2015   Needs o lower fat intake

## 2016-12-16 NOTE — Assessment & Plan Note (Signed)
Symptomatic treatment only, high risk for surgery

## 2016-12-16 NOTE — Assessment & Plan Note (Signed)
Heart failure with nocturnal hypoxia from overnight oximetry, she is to continue with supplemental oxygen

## 2016-12-16 NOTE — Progress Notes (Signed)
   [name] [name]     MRN: [id number]      DOB: Sep 11, 1932   HPI [name] [name] is here for follow up and re-evaluation of chronic medical conditions, medication management and review of any available recent lab and radiology data.  Preventive health is updated, specifically  Cancer screening and Immunization.   Hospitalized 2/22 to 2/23 because of acute on chronic heart failure, states much improved The PT denies any adverse reactions to current medications since the last visit.  c/o pain in index finger , feels as though a splinter is stuck inside , she has been picking at it with no success  ROS Denies recent fever or chills. Denies sinus pressure, nasal congestion, ear pain or sore throat. Denies chest congestion, productive cough or wheezing. Denies chest pains, palpitations and leg swelling Denies abdominal pain, nausea, vomiting,diarrhea or constipation.   Denies dysuria, frequency, hesitancy or incontinence. Denies uncontrolled joint pain, swelling and limitation in mobility. Denies headaches, seizures, numbness, or tingling. Denies depression, anxiety or insomnia.   PE  BP 136/60 (BP Location: Right Arm, Patient Position: Sitting, Cuff Size: Large)   Pulse 84   Temp 97.6 F (36.4 C) (Temporal)   Resp 18   Ht 5\' 7"  (1.702 m)   Wt 224 lb (101.6 kg)   SpO2 (!) 89%   BMI 35.08 kg/m   Patient alert and oriented and in no cardiopulmonary distress.  HEENT: No facial asymmetry, EOMI,   oropharynx pink and moist.  Neck supple no JVD, no mass.  Chest: Clear to auscultation bilaterally.  CVS: S1, S2 systolic  Murmur and click, no S3.IrRegular rate.  ABD: Soft non tender.   Ext: No edema  MS: decreased  ROM spine, shoulders, hips and knees.  Skin: ulceration of left index finger in area of concern where she feels that splinter remains  Psych: Good eye contact, normal affect. Memory intact not anxious or depressed appearing.  CNS: CN 2-12 intact, power,  normal  throughout.no focal deficits noted.   Assessment & Plan  Acute on chronic systolic CHF (congestive heart failure) (HCC) Resolved following overnight hospitalization in Feb, continued close cardiology follow up indicated. Denies exertional dyspnea and fatigue and on exam she has no signs of cardiac decompensation  AICD (automatic cardioverter/defibrillator) present Regularly checked and denies spontaneous malfunction, and has had no episodes of spontaneous defibrillation  Calculus of gallbladder with chronic cholecystitis without obstruction Symptomatic treatment only, high risk for surgery  Essential hypertension Controlled, no change in medication   Dependence on nocturnal oxygen therapy Heart failure with nocturnal hypoxia from overnight oximetry, she is to continue with supplemental oxygen  Hypothyroidism, postsurgical Controlled, no change in medication   Iron deficiency Excellent response to parenteral iron  Hyperlipidemia LDL goal <70 Hyperlipidemia:Low fat diet discussed and encouraged.   Lipid Panel  Lab Results  Component Value Date   CHOL 165 10/16/2016   HDL 31 (L) 10/16/2016   LDLCALC 81 10/16/2016   TRIG 265 (H) 10/16/2016   CHOLHDL 5.1 10/14/2015   Needs o lower fat intake

## 2016-12-16 NOTE — Assessment & Plan Note (Signed)
Excellent response to parenteral iron

## 2016-12-18 ENCOUNTER — Telehealth: Payer: Self-pay | Admitting: Cardiovascular Disease

## 2016-12-18 NOTE — Telephone Encounter (Signed)
Can we please ask her to do a pacemaker download and ask the device clinic to see if she has recurrent atrial fibrillation? Thank you MCr

## 2016-12-18 NOTE — Telephone Encounter (Signed)
Thank you! Please let the patient know she is still in normal rhythm MCr

## 2016-12-18 NOTE — Telephone Encounter (Signed)
Spoke with patient and assisted her in sending a remote transmission.

## 2016-12-18 NOTE — Telephone Encounter (Signed)
New message    Pt c/o of Chest Pain: STAT if CP now or developed within 24 hours  1. Are you having CP right now? no  2. Are you experiencing any other symptoms (ex. SOB, nausea, vomiting, sweating)? A little SOB  3. How long have you been experiencing CP?since last night   4. Is your CP continuous or coming and going? Coming and going   5. Have you taken Nitroglycerin? No doesn't have any ?

## 2016-12-18 NOTE — Telephone Encounter (Signed)
Called the patient to let her know she is in normal rhythm according to her device readings. She stated she has not had any pains since this morning and is feeling better.

## 2016-12-18 NOTE — Telephone Encounter (Signed)
Called the patient back to discuss her chest pain. She stated the pain started last night after dinner. She had three episodes last night and two this morning which last a few seconds each. She stated that she took Tums and it did provide some relief. She stated that the pain is a 9/10 and feels like a quick pinch.She denies nausea and vomiting. She stated she gets a little short of breath when up. She stated she did not want to go to the hospital yet but would like to see how she does this afternoon. If needed she stated she would go to [facility].

## 2016-12-18 NOTE — Telephone Encounter (Signed)
Called the patient back to check on her and she stated that she has not had any more chest pain this morning. I asked her if she knew how to do the pacemaker download and she stated she did not that the device clinic calls her for that.

## 2016-12-18 NOTE — Telephone Encounter (Signed)
Remote transmission reviewed. No monitored AT/AF episodes. Histograms show rates into the 200s with AT/AF burden of <0.1%.

## 2016-12-19 DIAGNOSIS — I130 Hypertensive heart and chronic kidney disease with heart failure and stage 1 through stage 4 chronic kidney disease, or unspecified chronic kidney disease: Secondary | ICD-10-CM | POA: Diagnosis not present

## 2016-12-19 DIAGNOSIS — E1122 Type 2 diabetes mellitus with diabetic chronic kidney disease: Secondary | ICD-10-CM | POA: Diagnosis not present

## 2016-12-19 DIAGNOSIS — I429 Cardiomyopathy, unspecified: Secondary | ICD-10-CM | POA: Diagnosis not present

## 2016-12-19 DIAGNOSIS — I5023 Acute on chronic systolic (congestive) heart failure: Secondary | ICD-10-CM | POA: Diagnosis not present

## 2016-12-19 DIAGNOSIS — J449 Chronic obstructive pulmonary disease, unspecified: Secondary | ICD-10-CM | POA: Diagnosis not present

## 2016-12-19 DIAGNOSIS — N184 Chronic kidney disease, stage 4 (severe): Secondary | ICD-10-CM | POA: Diagnosis not present

## 2016-12-20 ENCOUNTER — Encounter (HOSPITAL_BASED_OUTPATIENT_CLINIC_OR_DEPARTMENT_OTHER): Payer: Medicare Other

## 2016-12-20 ENCOUNTER — Encounter (HOSPITAL_COMMUNITY): Payer: Medicare Other

## 2016-12-20 ENCOUNTER — Encounter (HOSPITAL_COMMUNITY): Payer: Self-pay

## 2016-12-20 VITALS — BP 91/54 | HR 72 | Temp 98.4°F | Resp 18

## 2016-12-20 DIAGNOSIS — N184 Chronic kidney disease, stage 4 (severe): Secondary | ICD-10-CM | POA: Diagnosis present

## 2016-12-20 DIAGNOSIS — D649 Anemia, unspecified: Secondary | ICD-10-CM

## 2016-12-20 DIAGNOSIS — D631 Anemia in chronic kidney disease: Secondary | ICD-10-CM | POA: Diagnosis not present

## 2016-12-20 DIAGNOSIS — E611 Iron deficiency: Secondary | ICD-10-CM

## 2016-12-20 DIAGNOSIS — C184 Malignant neoplasm of transverse colon: Secondary | ICD-10-CM | POA: Diagnosis not present

## 2016-12-20 LAB — CBC
HCT: 33.1 % — ABNORMAL LOW (ref 36.0–46.0)
Hemoglobin: 10.6 g/dL — ABNORMAL LOW (ref 12.0–15.0)
MCH: 30.5 pg (ref 26.0–34.0)
MCHC: 32 g/dL (ref 30.0–36.0)
MCV: 95.4 fL (ref 78.0–100.0)
Platelets: 186 10*3/uL (ref 150–400)
RBC: 3.47 MIL/uL — ABNORMAL LOW (ref 3.87–5.11)
RDW: 16.2 % — ABNORMAL HIGH (ref 11.5–15.5)
WBC: 7.6 10*3/uL (ref 4.0–10.5)

## 2016-12-20 LAB — FERRITIN: Ferritin: 902 ng/mL — ABNORMAL HIGH (ref 11–307)

## 2016-12-20 MED ORDER — EPOETIN ALFA 40000 UNIT/ML IJ SOLN
INTRAMUSCULAR | Status: AC
  Filled 2016-12-20: qty 1

## 2016-12-20 MED ORDER — EPOETIN ALFA 40000 UNIT/ML IJ SOLN
40000.0000 [IU] | Freq: Once | INTRAMUSCULAR | Status: AC
  Administered 2016-12-20: 40000 [IU] via SUBCUTANEOUS

## 2016-12-20 NOTE — Patient Instructions (Signed)
[place] at [facility] Discharge Instructions  RECOMMENDATIONS MADE BY THE CONSULTANT AND ANY TEST RESULTS WILL BE SENT TO YOUR REFERRING PHYSICIAN.  Procrit 40,000 units.    Thank you for choosing [facility] to provide your oncology and hematology care.  To afford each patient quality time with our provider, please arrive at least 15 minutes before your scheduled appointment time.    If you have a lab appointment with the [facility] please come in thru the  Main Entrance and check in at the main information desk  You need to re-schedule your appointment should you arrive 10 or more minutes late.  We strive to give you quality time with our providers, and arriving late affects you and other patients whose appointments are after yours.  Also, if you no show three or more times for appointments you may be dismissed from the clinic at the providers discretion.     Again, thank you for choosing [facility].  Our hope is that these requests will decrease the amount of time that you wait before being seen by our physicians.       _____________________________________________________________  Should you have questions after your visit to [facility], please contact our office at [phone] between the hours of 8:30 a.m. and 4:30 p.m.  Voicemails left after 4:30 p.m. will not be returned until the following business day.  For prescription refill requests, have your pharmacy contact our office.       Resources For Cancer Patients and their Caregivers ? American Cancer Society: Can assist with transportation, wigs, general needs, runs Look Good Feel Better.        985 822 8683 ? Cancer Care: Provides financial assistance, online support groups, medication/co-pay assistance.  1-800-813-HOPE 670-272-1022) ? [employer] Assists [place] cancer patients and their families through  emotional , educational and financial support.  308-729-8455 ? [place] DSS Where to apply for food stamps, Medicaid and utility assistance. 416-605-1090 ? [employer]: Transportation to medical appointments. 323-295-2122 ? Social Security Administration: May apply for disability if have a Stage IV cancer. 438 249 7043 313-289-9281 ? [employer], Disability and Transit Services: Assists with nutrition, care and transit needs. [place] Support Programs: @10RELATIVEDAYS @ > Cancer Support Group  2nd Tuesday of the month 1pm-2pm, Journey Room  > Creative Journey  3rd Tuesday of the month 1130am-1pm, Journey Room  > Look Good Feel Better  1st Wednesday of the month 10am-12 noon, Journey Room (Call [facility] to register [phone])

## 2016-12-20 NOTE — Progress Notes (Signed)
[name] [name] presents today for injection per MD orders. Procrit 40,000 units administered SQ in left Abdomen. Administration without incident. Patient tolerated well.

## 2016-12-21 ENCOUNTER — Ambulatory Visit (INDEPENDENT_AMBULATORY_CARE_PROVIDER_SITE_OTHER): Payer: Medicare Other | Admitting: *Deleted

## 2016-12-21 DIAGNOSIS — Z952 Presence of prosthetic heart valve: Secondary | ICD-10-CM

## 2016-12-21 DIAGNOSIS — N184 Chronic kidney disease, stage 4 (severe): Secondary | ICD-10-CM | POA: Diagnosis not present

## 2016-12-21 DIAGNOSIS — I5023 Acute on chronic systolic (congestive) heart failure: Secondary | ICD-10-CM | POA: Diagnosis not present

## 2016-12-21 DIAGNOSIS — I130 Hypertensive heart and chronic kidney disease with heart failure and stage 1 through stage 4 chronic kidney disease, or unspecified chronic kidney disease: Secondary | ICD-10-CM | POA: Diagnosis not present

## 2016-12-21 DIAGNOSIS — E1122 Type 2 diabetes mellitus with diabetic chronic kidney disease: Secondary | ICD-10-CM | POA: Diagnosis not present

## 2016-12-21 DIAGNOSIS — I429 Cardiomyopathy, unspecified: Secondary | ICD-10-CM | POA: Diagnosis not present

## 2016-12-21 DIAGNOSIS — Z5181 Encounter for therapeutic drug level monitoring: Secondary | ICD-10-CM

## 2016-12-21 DIAGNOSIS — J449 Chronic obstructive pulmonary disease, unspecified: Secondary | ICD-10-CM | POA: Diagnosis not present

## 2016-12-21 LAB — POCT INR: INR: 3.8

## 2016-12-22 ENCOUNTER — Other Ambulatory Visit: Payer: Self-pay | Admitting: Family Medicine

## 2016-12-25 ENCOUNTER — Other Ambulatory Visit: Payer: Self-pay | Admitting: *Deleted

## 2016-12-25 ENCOUNTER — Other Ambulatory Visit: Payer: Self-pay | Admitting: Licensed Clinical Social Worker

## 2016-12-25 ENCOUNTER — Encounter: Payer: Self-pay | Admitting: *Deleted

## 2016-12-25 ENCOUNTER — Telehealth: Payer: Self-pay | Admitting: *Deleted

## 2016-12-25 NOTE — Patient Outreach (Addendum)
[facility]) Care Management  12/25/2016  [name] [name] [birth year] [id number]    THN CM received TOC patient report from Bryn Gulling, [facility] CM.   [facility] CM spoke with [name] to schedule next home visit   Plan Mrs Jon will be seen for home visit in first week in May 2018 with weekly calls (TOC patient)   Mickel Crow. Lavina [name], RN, BSN, [facility] Care Management [phone]

## 2016-12-25 NOTE — Patient Outreach (Signed)
Assessment:  CSW spoke via phone with client. CSW verified client identity. CSW received verbal permission from client on 12/25/16 for CSW to speak with client about current client needs.  Client sees Dr. [name] as primary care doctor.Client had appointment with Dr. [name] on 12/06/16.  Client said she also had a prescribed injection recently in her hip for pain management for client. Client resides with her nephew. Client has no children but relies on family members for support. She uses a cane to assist with ambulation. She receives transport assistance from family members to transport. her to and from her scheduled medical appointments. Client said she had dental needs, swelling in her jaw. She said she has appointment on 12/27/16 for dental examination. She said she did not have a fever. CSW to inform RN Jacqlyn Larsen on 12/25/16 of current dental needs of client. Client said she is receiving home health nursing care and home health physical therapy support. CSW and client spoke of client care plan.  CSW encouraged client to participate in scheduled client in home physical therapy sessions for client in next 30 days. CSW encouraged client to call RN Jacqlyn Larsen for nursing needs of client. CSW encouraged client to call CSW at [phone] as needed to discuss social work needs of client.  Client said she was appreciative of [facility] services she was currently receiving.    Plan:  CSW to collaborate with RN Jacqlyn Larsen in monitoring needs of client.  CSW to call client in 3 weeks to assess client needs.  [facility] CM Care Plan Problem One     Most Recent Value  Care Plan Problem One  Client needs information on transportation resources for client in the area  Role Documenting the Problem One  [place] for Problem One  Active  [facility] CM Short Term Goal #1 (0-30 days)  Client will communicate with CSW in the next 30 days to discuss transportation resources for client in the community  [facility]  CM Short Term Goal #1 Start Date  10/23/16  [facility] CM Short Term Goal #1 Met Date  11/23/16  Interventions for Short Term Goal #1  goal met. CSW spoke with client in above time period about  community resources for transpot assistance for client.  [facility] CM Short Term Goal #2 (0-30 days)  Client will communicate with CSW in next 30 days to discuss transport resources for client in the community.   [facility] CM Short Term Goal #2 Start Date  11/23/16  [facility] CM Short Term Goal #2 Met Date  12/25/16  Interventions for Short Term Goal #2  goal met. CSW communicated with client in above time period about transport resources for client in the community. Goal is met.    [facility] CM Care Plan Problem Two     Most Recent Value  Care Plan Problem Two  Client needs to participate actively in home physicla therpay sessions scheudled for dlient  Role Documenting the Problem Two  Clinical Social Worker  Care Plan for Problem Two  Active  [facility] CM Short Term Goal #1 (0-30 days)  Client will participate actively in next 30 days in all scheudled in home physical therapy sessions for client  [facility] CM Short Term Goal #1 Start Date  12/25/16  Interventions for Short Term Goal #2   CSW encouraged client to participate in all scheduled clinet in home physical therapy sesions for client in next 30 days.       [name].Yaakov Saindon MSW,  LCSW Licensed Clinical Social Worker [facility] Care Management [phone]

## 2016-12-25 NOTE — Patient Outreach (Signed)
[place] [facility]) Care Management   12/25/2016  [name] [name] 03-04-1932 [id number]  [name] [name] is an 83 y.o. female  Subjective: Routine home visit with pt, HIPAA verified, pt reports overall she is doing well but has some left jaw swelling and states " I've had this for a long time, due to problems with partial plate and I'm seeing dentist on Wednesday"  Pt states she continues to weigh daily, CBG 105 today, home health continues to work with pt.  Objective:   Vitals:   12/25/16 1735  BP: 110/60  Pulse: 70  Resp: 16  SpO2: 96%  Weight: 216 lb (98 kg)   ROS  Physical Exam  Constitutional: She is oriented to person, place, and time. She appears well-developed and well-nourished.  HENT:  Head: Normocephalic.  Neck: Normal range of motion. Neck supple.  Cardiovascular: Normal rate.   Respiratory: Effort normal and breath sounds normal.  GI: Soft. Bowel sounds are normal.  Musculoskeletal: Normal range of motion. She exhibits edema.  Dependent edema lower extremities bil  Neurological: She is alert and oriented to person, place, and time.  Skin: Skin is warm and dry.  Psychiatric: She has a normal mood and affect. Her behavior is normal. Judgment and thought content normal.    Encounter Medications:   Outpatient Encounter Prescriptions as of 12/25/2016  Medication Sig Note  . acetaminophen (TYLENOL) 500 MG tablet Take 500 mg by mouth every 6 (six) hours as needed (pain).   Marland Kitchen albuterol (PROVENTIL) (2.5 MG/3ML) 0.083% nebulizer solution INHALE 1 VIAL VIA NEBULIZER THREE TIMES DAILY.   Marland Kitchen albuterol (VENTOLIN HFA) 108 (90 Base) MCG/ACT inhaler Inhale 2 puffs into the lungs every 6 (six) hours as needed for wheezing or shortness of breath.   . allopurinol (ZYLOPRIM) 100 MG tablet Take 1 tablet (100 mg total) by mouth daily.   Marland Kitchen azelastine (ASTELIN) 0.1 % nasal spray Place 2 sprays into both nostrils 2 (two) times daily. Use in each nostril as directed   .  cholecalciferol (VITAMIN D) 1000 UNITS tablet Take 1,000 Units by mouth daily.   Marland Kitchen COUMADIN 5 MG tablet Take 2 tablets daily (Patient taking differently: Take 10 mg by mouth every evening. Take 5mg  on Wednesdays and Saturdays and take 10mg  on all other days) 10/31/2016: BRAND ONLY  . digoxin (LANOXIN) 0.125 MG tablet Take 1 tablet (0.125 mg total) by mouth every other day. 10/31/2016: BRAND ONLY  . docusate sodium (COLACE) 100 MG capsule Take 300 mg by mouth at bedtime.   . fluticasone (FLONASE) 50 MCG/ACT nasal spray USE 2 SPRAYS IN EACH       NOSTRIL ONCE DAILY (Patient taking differently: USE 2 SPRAYS IN EACH       NOSTRIL ONCE DAILY AS NEEDED FOR CONGESTION)   . folic acid (FOLVITE) 1 MG tablet Take 1 tablet (1 mg total) by mouth daily.   . furosemide (LASIX) 40 MG tablet Take 1 tablet by mouth  daily   . gabapentin (NEURONTIN) 300 MG capsule Take 1 capsule (300 mg total) by mouth 2 (two) times daily.   . hydrALAZINE (APRESOLINE) 25 MG tablet Take 1 tablet (25 mg total) by mouth 3 (three) times daily.   Marland Kitchen HYDROcodone-acetaminophen (NORCO) 7.5-325 MG tablet Take 1 tablet by mouth every 6 (six) hours as needed for moderate pain (Must last 30 days.Do not drive or operate machinery while taking this medicine.).   Marland Kitchen isosorbide mononitrate (IMDUR) 60 MG 24 hr tablet take 1 tablet by mouth once  daily   . losartan (COZAAR) 50 MG tablet take 1 tablet by mouth once daily   . metolazone (ZAROXOLYN) 2.5 MG tablet Take 1 tablet (2.5 mg total) by mouth once a week. 11/16/2016: Patient took two doses 11/15/2016  . ondansetron (ZOFRAN) 4 MG tablet Take 1 tablet (4 mg total) by mouth daily as needed for nausea or vomiting.   [name] [name] (SYSTANE OP) Place 1 drop into both eyes daily as needed (dry eyes).   . potassium chloride SA (KLOR-CON M20) 20 MEQ tablet Take 1 tablet by mouth  daily (Patient taking differently: Take 20 mEq by mouth daily. Take 1 tablet by mouth  daily)   . pravastatin  (PRAVACHOL) 20 MG tablet Take 1 tablet (20 mg total) by mouth daily.   . promethazine-dextromethorphan (PROMETHAZINE-DM) 6.25-15 MG/5ML syrup One teaspoon at bedtime, as needed, for excessive cough (Patient taking differently: Take 5 mLs by mouth at bedtime as needed for cough. as needed, for excessive cough)   . SYNTHROID 75 MCG tablet Take 1 tablet by mouth  daily   . TOPROL XL 100 MG 24 hr tablet Take 1 tablet (100 mg total) by mouth daily. Take with or immediately following a meal.    Facility-Administered Encounter Medications as of 12/25/2016  Medication  . epoetin alfa (EPOGEN,PROCRIT) injection 40,000 Units    Functional Status:   In your present state of health, do you have any difficulty performing the following activities: 11/30/2016 11/17/2016  Hearing? N -  Vision? N -  Difficulty concentrating or making decisions? N -  Walking or climbing stairs? Y -  Dressing or bathing? N -  Doing errands, shopping? N N  Preparing Food and eating ? N -  Using the Toilet? N -  In the past six months, have you accidently leaked urine? N -  Do you have problems with loss of bowel control? N -  Managing your Medications? N -  Managing your Finances? N -  Housekeeping or managing your Housekeeping? Y -  Some recent data might be hidden    Fall/Depression Screening:    PHQ 2/9 Scores 11/30/2016 11/03/2016 10/13/2016 09/19/2016 08/08/2016 07/05/2016 04/24/2016  PHQ - 2 Score 1 0 0 0 0 0 2  PHQ- 9 Score - - - - - - 9    Assessment:  RN CM continues to reiterate action plan, pt would like another home visit in May then will consider transfer to health coach, care transferred to [place], called and sent in basket report.  Plan: see pt for home visit in May Assess weight, CBG Plan to transfer to RN health coach at that time  Jacqlyn Larsen [facility], [place] [phone]

## 2016-12-26 ENCOUNTER — Telehealth: Payer: Self-pay

## 2016-12-26 ENCOUNTER — Ambulatory Visit (INDEPENDENT_AMBULATORY_CARE_PROVIDER_SITE_OTHER): Payer: Medicare Other | Admitting: *Deleted

## 2016-12-26 DIAGNOSIS — I5022 Chronic systolic (congestive) heart failure: Secondary | ICD-10-CM | POA: Diagnosis not present

## 2016-12-26 DIAGNOSIS — I255 Ischemic cardiomyopathy: Secondary | ICD-10-CM

## 2016-12-26 NOTE — Progress Notes (Signed)
Remote ICD transmission.   

## 2016-12-26 NOTE — Telephone Encounter (Signed)
Also faxed signed INR orders (from 11/22/16, 11/30/16, and 12/07/16) to April Holding at [facility] on 12/15/16.

## 2016-12-26 NOTE — Telephone Encounter (Signed)
Faxed signed INR orders (from 12/15/16 and 12/22/16) to [name] [name] at [facility].

## 2016-12-27 ENCOUNTER — Other Ambulatory Visit: Payer: Self-pay | Admitting: Family Medicine

## 2016-12-27 ENCOUNTER — Encounter: Payer: Self-pay | Admitting: Cardiology

## 2016-12-27 DIAGNOSIS — E1122 Type 2 diabetes mellitus with diabetic chronic kidney disease: Secondary | ICD-10-CM | POA: Diagnosis not present

## 2016-12-27 DIAGNOSIS — I130 Hypertensive heart and chronic kidney disease with heart failure and stage 1 through stage 4 chronic kidney disease, or unspecified chronic kidney disease: Secondary | ICD-10-CM | POA: Diagnosis not present

## 2016-12-27 DIAGNOSIS — J449 Chronic obstructive pulmonary disease, unspecified: Secondary | ICD-10-CM | POA: Diagnosis not present

## 2016-12-27 DIAGNOSIS — N184 Chronic kidney disease, stage 4 (severe): Secondary | ICD-10-CM | POA: Diagnosis not present

## 2016-12-27 DIAGNOSIS — I429 Cardiomyopathy, unspecified: Secondary | ICD-10-CM | POA: Diagnosis not present

## 2016-12-27 DIAGNOSIS — I5023 Acute on chronic systolic (congestive) heart failure: Secondary | ICD-10-CM | POA: Diagnosis not present

## 2016-12-27 LAB — CUP PACEART REMOTE DEVICE CHECK
Battery Remaining Longevity: 17 mo
Battery Voltage: 2.92 V
Brady Statistic AP VP Percent: 85.93 %
Brady Statistic AP VS Percent: 0 %
Brady Statistic AS VP Percent: 14.04 %
Brady Statistic AS VS Percent: 0.03 %
Brady Statistic RA Percent Paced: 85.75 %
Brady Statistic RV Percent Paced: 99.48 %
Date Time Interrogation Session: 20180403062604
HighPow Impedance: 35 Ohm
HighPow Impedance: 44 Ohm
Implantable Lead Implant Date: 20040521
Implantable Lead Implant Date: 20071218
Implantable Lead Implant Date: 20160516
Implantable Lead Location: 753858
Implantable Lead Location: 753859
Implantable Lead Location: 753860
Implantable Lead Model: 5076
Implantable Lead Model: 6947
Implantable Pulse Generator Implant Date: 20160516
Lead Channel Impedance Value: 285 Ohm
Lead Channel Impedance Value: 342 Ohm
Lead Channel Impedance Value: 342 Ohm
Lead Channel Impedance Value: 361 Ohm
Lead Channel Impedance Value: 399 Ohm
Lead Channel Impedance Value: 399 Ohm
Lead Channel Impedance Value: 456 Ohm
Lead Channel Impedance Value: 551 Ohm
Lead Channel Impedance Value: 608 Ohm
Lead Channel Impedance Value: 646 Ohm
Lead Channel Impedance Value: 703 Ohm
Lead Channel Impedance Value: 703 Ohm
Lead Channel Impedance Value: 760 Ohm
Lead Channel Pacing Threshold Amplitude: 0.5 V
Lead Channel Pacing Threshold Amplitude: 1.5 V
Lead Channel Pacing Threshold Amplitude: 5 V
Lead Channel Pacing Threshold Pulse Width: 0.4 ms
Lead Channel Pacing Threshold Pulse Width: 0.4 ms
Lead Channel Pacing Threshold Pulse Width: 1.5 ms
Lead Channel Sensing Intrinsic Amplitude: 1.625 mV
Lead Channel Sensing Intrinsic Amplitude: 1.625 mV
Lead Channel Sensing Intrinsic Amplitude: 10.5 mV
Lead Channel Sensing Intrinsic Amplitude: 10.5 mV
Lead Channel Setting Pacing Amplitude: 2 V
Lead Channel Setting Pacing Amplitude: 2.5 V
Lead Channel Setting Pacing Amplitude: 2.5 V
Lead Channel Setting Pacing Pulse Width: 0.6 ms
Lead Channel Setting Pacing Pulse Width: 1.5 ms
Lead Channel Setting Sensing Sensitivity: 0.3 mV

## 2016-12-29 ENCOUNTER — Ambulatory Visit (INDEPENDENT_AMBULATORY_CARE_PROVIDER_SITE_OTHER): Payer: Medicare Other | Admitting: Internal Medicine

## 2016-12-29 DIAGNOSIS — N184 Chronic kidney disease, stage 4 (severe): Secondary | ICD-10-CM | POA: Diagnosis not present

## 2016-12-29 DIAGNOSIS — Z952 Presence of prosthetic heart valve: Secondary | ICD-10-CM

## 2016-12-29 DIAGNOSIS — I429 Cardiomyopathy, unspecified: Secondary | ICD-10-CM | POA: Diagnosis not present

## 2016-12-29 DIAGNOSIS — Z5181 Encounter for therapeutic drug level monitoring: Secondary | ICD-10-CM

## 2016-12-29 DIAGNOSIS — I5023 Acute on chronic systolic (congestive) heart failure: Secondary | ICD-10-CM | POA: Diagnosis not present

## 2016-12-29 DIAGNOSIS — E1122 Type 2 diabetes mellitus with diabetic chronic kidney disease: Secondary | ICD-10-CM | POA: Diagnosis not present

## 2016-12-29 DIAGNOSIS — J449 Chronic obstructive pulmonary disease, unspecified: Secondary | ICD-10-CM | POA: Diagnosis not present

## 2016-12-29 DIAGNOSIS — I130 Hypertensive heart and chronic kidney disease with heart failure and stage 1 through stage 4 chronic kidney disease, or unspecified chronic kidney disease: Secondary | ICD-10-CM | POA: Diagnosis not present

## 2016-12-29 LAB — POCT INR: INR: 3

## 2017-01-02 DIAGNOSIS — N184 Chronic kidney disease, stage 4 (severe): Secondary | ICD-10-CM | POA: Diagnosis not present

## 2017-01-02 DIAGNOSIS — J449 Chronic obstructive pulmonary disease, unspecified: Secondary | ICD-10-CM | POA: Diagnosis not present

## 2017-01-02 DIAGNOSIS — I429 Cardiomyopathy, unspecified: Secondary | ICD-10-CM | POA: Diagnosis not present

## 2017-01-02 DIAGNOSIS — E1122 Type 2 diabetes mellitus with diabetic chronic kidney disease: Secondary | ICD-10-CM | POA: Diagnosis not present

## 2017-01-02 DIAGNOSIS — I130 Hypertensive heart and chronic kidney disease with heart failure and stage 1 through stage 4 chronic kidney disease, or unspecified chronic kidney disease: Secondary | ICD-10-CM | POA: Diagnosis not present

## 2017-01-02 DIAGNOSIS — I5023 Acute on chronic systolic (congestive) heart failure: Secondary | ICD-10-CM | POA: Diagnosis not present

## 2017-01-04 ENCOUNTER — Telehealth: Payer: Self-pay | Admitting: Orthopaedic Surgery

## 2017-01-04 DIAGNOSIS — N184 Chronic kidney disease, stage 4 (severe): Secondary | ICD-10-CM | POA: Diagnosis not present

## 2017-01-04 DIAGNOSIS — J449 Chronic obstructive pulmonary disease, unspecified: Secondary | ICD-10-CM | POA: Diagnosis not present

## 2017-01-04 DIAGNOSIS — I130 Hypertensive heart and chronic kidney disease with heart failure and stage 1 through stage 4 chronic kidney disease, or unspecified chronic kidney disease: Secondary | ICD-10-CM | POA: Diagnosis not present

## 2017-01-04 DIAGNOSIS — I5023 Acute on chronic systolic (congestive) heart failure: Secondary | ICD-10-CM | POA: Diagnosis not present

## 2017-01-04 DIAGNOSIS — E1122 Type 2 diabetes mellitus with diabetic chronic kidney disease: Secondary | ICD-10-CM | POA: Diagnosis not present

## 2017-01-04 DIAGNOSIS — I429 Cardiomyopathy, unspecified: Secondary | ICD-10-CM | POA: Diagnosis not present

## 2017-01-04 MED ORDER — HYDROCODONE-ACETAMINOPHEN 7.5-325 MG PO TABS: 1.0000 | ORAL_TABLET | Freq: Four times a day (QID) | ORAL | 0 refills | Status: DC | PRN

## 2017-01-04 NOTE — Telephone Encounter (Signed)
Patient requests refill on Hydrocodone/Acetaminophen  7.5-325  mgs.   Qty  105  Sig: Take 1 tablet by mouth every 6 (six) hours as needed for moderate pain (Must last 30 days.Do not drive or operate machinery while taking this medicine.).

## 2017-01-05 ENCOUNTER — Telehealth: Payer: Self-pay

## 2017-01-05 NOTE — Telephone Encounter (Signed)
Faxed signed physician orders to April Holding at [facility].

## 2017-01-09 DIAGNOSIS — J449 Chronic obstructive pulmonary disease, unspecified: Secondary | ICD-10-CM | POA: Diagnosis not present

## 2017-01-09 DIAGNOSIS — I429 Cardiomyopathy, unspecified: Secondary | ICD-10-CM | POA: Diagnosis not present

## 2017-01-09 DIAGNOSIS — N184 Chronic kidney disease, stage 4 (severe): Secondary | ICD-10-CM | POA: Diagnosis not present

## 2017-01-09 DIAGNOSIS — E1122 Type 2 diabetes mellitus with diabetic chronic kidney disease: Secondary | ICD-10-CM | POA: Diagnosis not present

## 2017-01-09 DIAGNOSIS — I5023 Acute on chronic systolic (congestive) heart failure: Secondary | ICD-10-CM | POA: Diagnosis not present

## 2017-01-09 DIAGNOSIS — I130 Hypertensive heart and chronic kidney disease with heart failure and stage 1 through stage 4 chronic kidney disease, or unspecified chronic kidney disease: Secondary | ICD-10-CM | POA: Diagnosis not present

## 2017-01-10 ENCOUNTER — Encounter (HOSPITAL_COMMUNITY): Payer: Medicare Other | Attending: Oncology

## 2017-01-10 ENCOUNTER — Encounter (HOSPITAL_COMMUNITY): Payer: Self-pay

## 2017-01-10 ENCOUNTER — Encounter (HOSPITAL_COMMUNITY): Payer: Medicare Other

## 2017-01-10 VITALS — BP 141/52 | HR 71 | Temp 98.2°F | Resp 16

## 2017-01-10 DIAGNOSIS — I429 Cardiomyopathy, unspecified: Secondary | ICD-10-CM | POA: Diagnosis not present

## 2017-01-10 DIAGNOSIS — E611 Iron deficiency: Secondary | ICD-10-CM | POA: Insufficient documentation

## 2017-01-10 DIAGNOSIS — D631 Anemia in chronic kidney disease: Secondary | ICD-10-CM

## 2017-01-10 DIAGNOSIS — I5023 Acute on chronic systolic (congestive) heart failure: Secondary | ICD-10-CM | POA: Diagnosis not present

## 2017-01-10 DIAGNOSIS — N184 Chronic kidney disease, stage 4 (severe): Secondary | ICD-10-CM | POA: Diagnosis present

## 2017-01-10 DIAGNOSIS — E1122 Type 2 diabetes mellitus with diabetic chronic kidney disease: Secondary | ICD-10-CM | POA: Diagnosis not present

## 2017-01-10 DIAGNOSIS — I130 Hypertensive heart and chronic kidney disease with heart failure and stage 1 through stage 4 chronic kidney disease, or unspecified chronic kidney disease: Secondary | ICD-10-CM | POA: Diagnosis not present

## 2017-01-10 DIAGNOSIS — D649 Anemia, unspecified: Secondary | ICD-10-CM

## 2017-01-10 DIAGNOSIS — J449 Chronic obstructive pulmonary disease, unspecified: Secondary | ICD-10-CM | POA: Diagnosis not present

## 2017-01-10 LAB — FERRITIN: Ferritin: 894 ng/mL — ABNORMAL HIGH (ref 11–307)

## 2017-01-10 LAB — CBC
HCT: 33.6 % — ABNORMAL LOW (ref 36.0–46.0)
Hemoglobin: 10.7 g/dL — ABNORMAL LOW (ref 12.0–15.0)
MCH: 30.4 pg (ref 26.0–34.0)
MCHC: 31.8 g/dL (ref 30.0–36.0)
MCV: 95.5 fL (ref 78.0–100.0)
Platelets: 180 10*3/uL (ref 150–400)
RBC: 3.52 MIL/uL — ABNORMAL LOW (ref 3.87–5.11)
RDW: 15.8 % — ABNORMAL HIGH (ref 11.5–15.5)
WBC: 6.3 10*3/uL (ref 4.0–10.5)

## 2017-01-10 MED ORDER — EPOETIN ALFA 40000 UNIT/ML IJ SOLN
40000.0000 [IU] | Freq: Once | INTRAMUSCULAR | Status: AC
  Administered 2017-01-10: 40000 [IU] via SUBCUTANEOUS
  Filled 2017-01-10: qty 1

## 2017-01-10 NOTE — Progress Notes (Signed)
[name] [name] presents today for injection per the provider's orders.  Procrit administration without incident; see MAR for injection details.  Patient tolerated procedure well and without incident.  No questions or complaints noted at this time.

## 2017-01-10 NOTE — Patient Instructions (Signed)
[facility] at [facility] Discharge Instructions  RECOMMENDATIONS MADE BY THE CONSULTANT AND ANY TEST RESULTS WILL BE SENT TO YOUR REFERRING PHYSICIAN.  Procrit injection today. Return as scheduled.   Thank you for choosing [facility] at [facility] to provide your oncology and hematology care.  To afford each patient quality time with our provider, please arrive at least 15 minutes before your scheduled appointment time.    If you have a lab appointment with the Cancer Center please come in thru the  Main Entrance and check in at the main information desk  You need to re-schedule your appointment should you arrive 10 or more minutes late.  We strive to give you quality time with our providers, and arriving late affects you and other patients whose appointments are after yours.  Also, if you no show three or more times for appointments you may be dismissed from the clinic at the providers discretion.     Again, thank you for choosing [facility].  Our hope is that these requests will decrease the amount of time that you wait before being seen by our physicians.       _____________________________________________________________  Should you have questions after your visit to [facility], please contact our office at [phone] between the hours of 8:30 a.m. and 4:30 p.m.  Voicemails left after 4:30 p.m. will not be returned until the following business day.  For prescription refill requests, have your pharmacy contact our office.       Resources For Cancer Patients and their Caregivers ? American Cancer Society: Can assist with transportation, wigs, general needs, runs Look Good Feel Better.        1-888-227-6333 ? Cancer Care: Provides financial assistance, online support groups, medication/co-pay assistance.  1-800-813-HOPE (4673) ? [facility] Assists [place] cancer patients and  their families through emotional , educational and financial support.  336-427-4357 ? [place] DSS Where to apply for food stamps, Medicaid and utility assistance. 336-342-1394 ? [facility]: Transportation to medical appointments. 336-347-2287 ? Social Security Administration: May apply for disability if have a Stage IV cancer. 336-342-7796 1-800-772-1213 ? [place] Aging, Disability and Transit Services: Assists with nutrition, care and transit needs. 336-349-2343  Cancer Center Support Programs: @10RELATIVEDAYS@ > Cancer Support Group  2nd Tuesday of the month 1pm-2pm, Journey Room  > Creative Journey  3rd Tuesday of the month 1130am-1pm, Journey Room  > Look Good Feel Better  1st Wednesday of the month 10am-12 noon, Journey Room (Call American Cancer Society to register 1-800-395-5775)   

## 2017-01-11 ENCOUNTER — Telehealth: Payer: Self-pay | Admitting: Cardiovascular Disease

## 2017-01-11 ENCOUNTER — Ambulatory Visit (INDEPENDENT_AMBULATORY_CARE_PROVIDER_SITE_OTHER): Payer: Medicare Other | Admitting: Cardiovascular Disease

## 2017-01-11 DIAGNOSIS — Z952 Presence of prosthetic heart valve: Secondary | ICD-10-CM

## 2017-01-11 DIAGNOSIS — J449 Chronic obstructive pulmonary disease, unspecified: Secondary | ICD-10-CM | POA: Diagnosis not present

## 2017-01-11 DIAGNOSIS — Z5181 Encounter for therapeutic drug level monitoring: Secondary | ICD-10-CM

## 2017-01-11 DIAGNOSIS — N184 Chronic kidney disease, stage 4 (severe): Secondary | ICD-10-CM | POA: Diagnosis not present

## 2017-01-11 DIAGNOSIS — I429 Cardiomyopathy, unspecified: Secondary | ICD-10-CM | POA: Diagnosis not present

## 2017-01-11 DIAGNOSIS — I130 Hypertensive heart and chronic kidney disease with heart failure and stage 1 through stage 4 chronic kidney disease, or unspecified chronic kidney disease: Secondary | ICD-10-CM | POA: Diagnosis not present

## 2017-01-11 DIAGNOSIS — I5023 Acute on chronic systolic (congestive) heart failure: Secondary | ICD-10-CM | POA: Diagnosis not present

## 2017-01-11 DIAGNOSIS — E1122 Type 2 diabetes mellitus with diabetic chronic kidney disease: Secondary | ICD-10-CM | POA: Diagnosis not present

## 2017-01-11 LAB — POCT INR: INR: 3.7

## 2017-01-11 NOTE — Telephone Encounter (Signed)
See Coumadin encounter of this date

## 2017-01-11 NOTE — Telephone Encounter (Signed)
inr 3.7

## 2017-01-15 ENCOUNTER — Other Ambulatory Visit: Payer: Self-pay | Admitting: Licensed Clinical Social Worker

## 2017-01-15 NOTE — Patient Outreach (Signed)
Assessment:  CSW spoke via phone with client. CSW verified client identity. CSW and client spoke of client needs. Client said she had her prescribed medications and is taking medications as prescribed. Client sees Dr. [name] as primary care doctor.Client resides with her nephew. She has no children but relies on family members for support as needed. She uses a cane to assist her with ambulation. Client receives transport assistance from her family members to transport client to and from client's medical appointments. On previous CSW call with client, client had spoken of dental needs of client. Client said she planned to scheduled dental exam appointment for client.Client went to dental appointment on 12/27/16 and received some dental treatment/care.  Client said she has scheduled medical appointment with Dr. [name] in June of 2018.  Client had been receiving home health nursing support and had been receiving home health physical therapy support. Client said she is nearing the end of her home health services support at present. Client is receiving [facility] nursing support with RN Jackelyn Poling.  CSW and client spoke of client care plan. CSW encouraged client to participate in scheduled client in home physical therapy sessions for client in the next 30 days. Client takes a prescribed pain medication.  Client said she is eating well. She said she has decreased energy and is sleeping more, even during the day.  She said she receives some assistance from her nephew. She said she receives prescribed injection, as scheduled to help her manage her anemia.  She said her right leg is sometimes weak.  She said she sometimes has difficulty upon standing and even leans against furniture to steady herself. She said her legs feel weak in walking (on occasion). CSW informed client on 01/15/17 that [place] would update RN Jackelyn Poling regarding above client symptoms. Client agreed to this plan. CSW thanked client for phone call with  CSW. CSW encouraged client to call CSW at [phone] as needed to discuss social work needs of client.   Plan:  Client to participate in scheduled client in home physical therapy sessions for client in the next 30 days. .  CSW to collaborate with RN Jackelyn Poling, as needed, in monitoring needs of client.  CSW to call client in 4 weeks to assess client needs.   [name].Rania Prothero MSW, LCSW Licensed Clinical Social Worker [facility] Care Management [phone]

## 2017-01-16 ENCOUNTER — Other Ambulatory Visit: Payer: Self-pay | Admitting: *Deleted

## 2017-01-16 DIAGNOSIS — J449 Chronic obstructive pulmonary disease, unspecified: Secondary | ICD-10-CM | POA: Diagnosis not present

## 2017-01-16 DIAGNOSIS — N184 Chronic kidney disease, stage 4 (severe): Secondary | ICD-10-CM | POA: Diagnosis not present

## 2017-01-16 DIAGNOSIS — I130 Hypertensive heart and chronic kidney disease with heart failure and stage 1 through stage 4 chronic kidney disease, or unspecified chronic kidney disease: Secondary | ICD-10-CM | POA: Diagnosis not present

## 2017-01-16 DIAGNOSIS — I5023 Acute on chronic systolic (congestive) heart failure: Secondary | ICD-10-CM | POA: Diagnosis not present

## 2017-01-16 DIAGNOSIS — I429 Cardiomyopathy, unspecified: Secondary | ICD-10-CM | POA: Diagnosis not present

## 2017-01-16 DIAGNOSIS — E1122 Type 2 diabetes mellitus with diabetic chronic kidney disease: Secondary | ICD-10-CM | POA: Diagnosis not present

## 2017-01-16 NOTE — Patient Outreach (Signed)
[facility]) Care Management  01/16/2017  [name] [name] 07-05-1932 [id number]   Care coordination:  [facility] CM received a call from [name] [name] after a call to [name] [name] on 01/15/17.  [facility] CM returned a call and [facility] SW shared information [name] [name] provider during her telephone call to include per SW continues t live with nephew, weak, hard to stand from sitting (fall risk potential-uses cane), increased sleeping during day but has medications for dizziness, and transportation services   Plan: [place] shared with [facility] SW [name] [name] received from [employer] and Cm initial home visit on 01/23/17 but CM has spoken with [name] [name] and will call as needed   Joelene Millin L. Lavina [name], RN, BSN, [place] Care Management [phone]

## 2017-01-19 DIAGNOSIS — L603 Nail dystrophy: Secondary | ICD-10-CM | POA: Diagnosis not present

## 2017-01-19 DIAGNOSIS — Z79899 Other long term (current) drug therapy: Secondary | ICD-10-CM | POA: Diagnosis not present

## 2017-01-19 DIAGNOSIS — E1142 Type 2 diabetes mellitus with diabetic polyneuropathy: Secondary | ICD-10-CM | POA: Diagnosis not present

## 2017-01-19 DIAGNOSIS — I10 Essential (primary) hypertension: Secondary | ICD-10-CM | POA: Diagnosis not present

## 2017-01-19 DIAGNOSIS — D509 Iron deficiency anemia, unspecified: Secondary | ICD-10-CM | POA: Diagnosis not present

## 2017-01-19 DIAGNOSIS — N183 Chronic kidney disease, stage 3 (moderate): Secondary | ICD-10-CM | POA: Diagnosis not present

## 2017-01-19 DIAGNOSIS — E559 Vitamin D deficiency, unspecified: Secondary | ICD-10-CM | POA: Diagnosis not present

## 2017-01-19 DIAGNOSIS — L851 Acquired keratosis [keratoderma] palmaris et plantaris: Secondary | ICD-10-CM | POA: Diagnosis not present

## 2017-01-19 DIAGNOSIS — R809 Proteinuria, unspecified: Secondary | ICD-10-CM | POA: Diagnosis not present

## 2017-01-20 IMAGING — CT CT RENAL STONE PROTOCOL
2 of 4 series · 15 of 46 positions shown, 17 images · non-contrast
Comparison: 01/27/2008 and lumbar spine CT 12/03/2015.

CLINICAL DATA: Hematuria, right flank pain starting yesterday

EXAM:
CT ABDOMEN AND PELVIS WITHOUT CONTRAST
TECHNIQUE: Multidetector CT imaging of the abdomen and pelvis was performed
following the standard protocol without IV contrast.

[Series 2: routine abd pel with · axial · 0.82mm/px · z∈[-385,-25]mm · 12 of 83 slices shown, 14 images]
[im 7/83  soft-tissue]
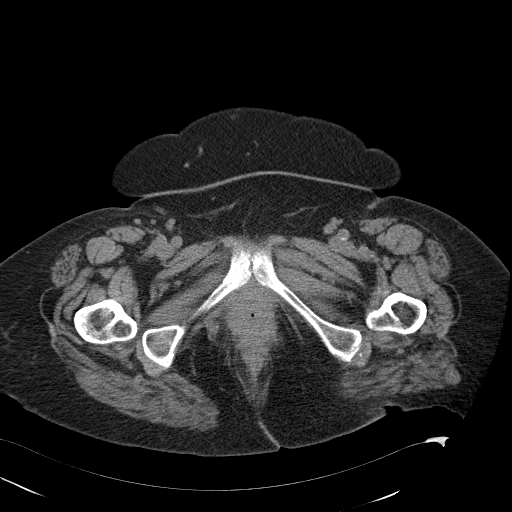
[im 7/83  bone]
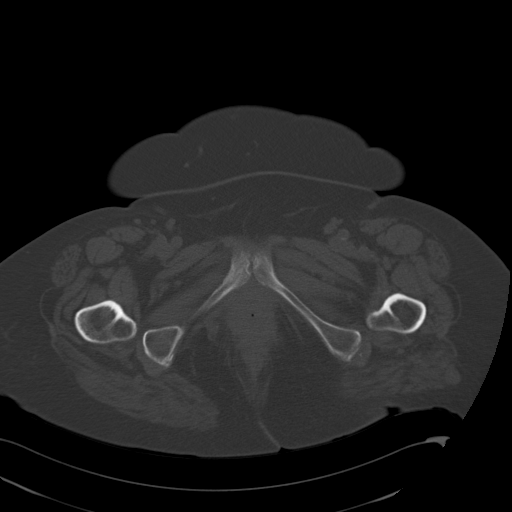
[im 14/83  soft-tissue]
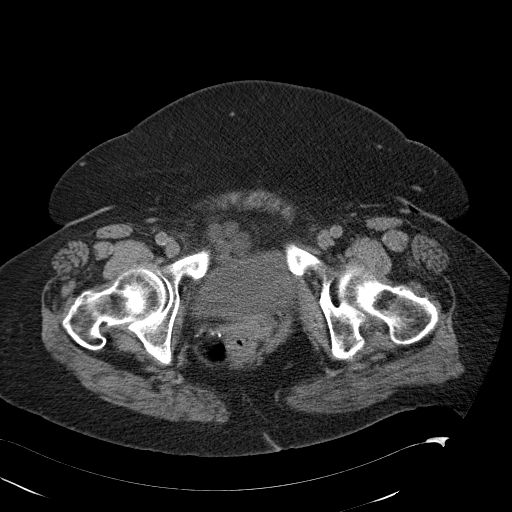
[im 20/83  soft-tissue]
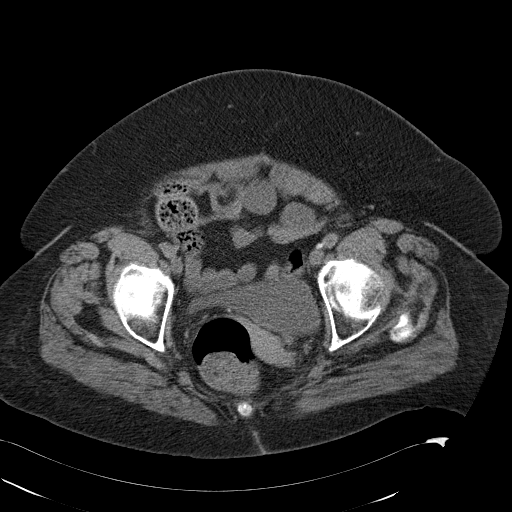
[im 27/83  soft-tissue]
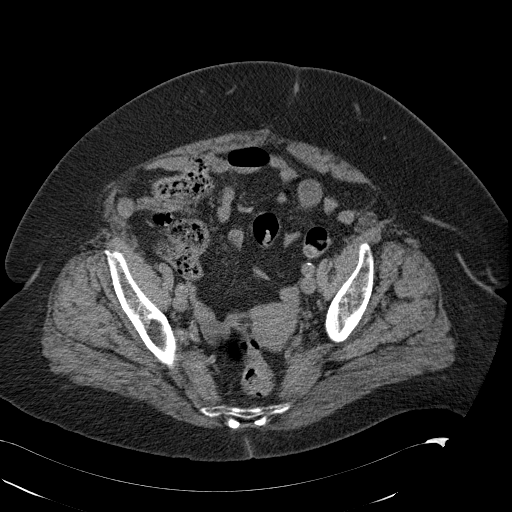
[im 33/83  soft-tissue]
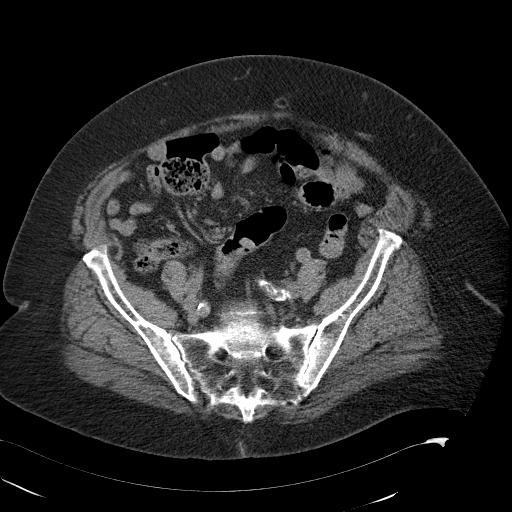
[im 40/83  soft-tissue]
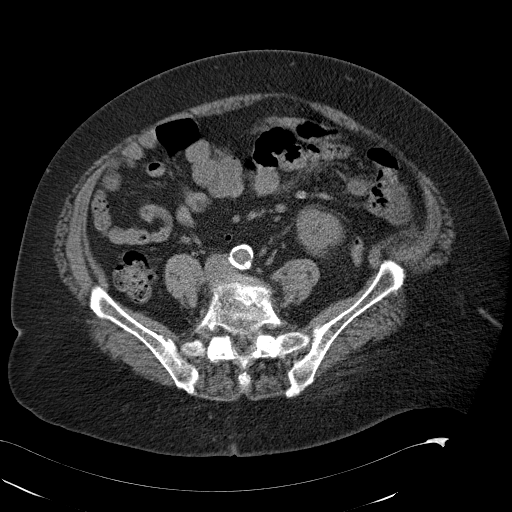
[im 46/83  soft-tissue]
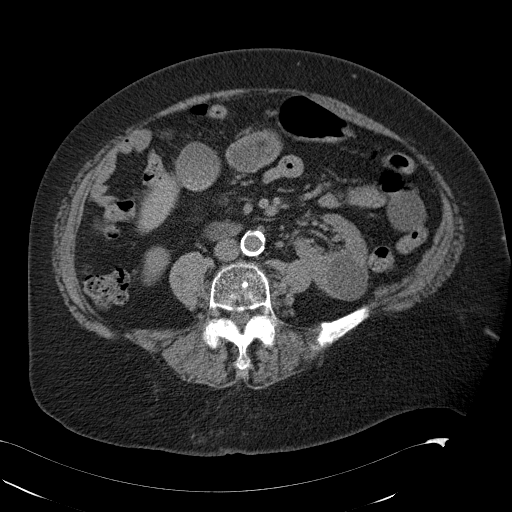
[im 53/83  soft-tissue]
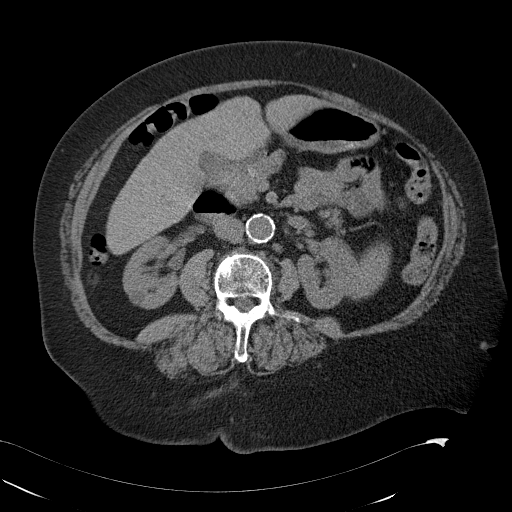
[im 60/83  soft-tissue]
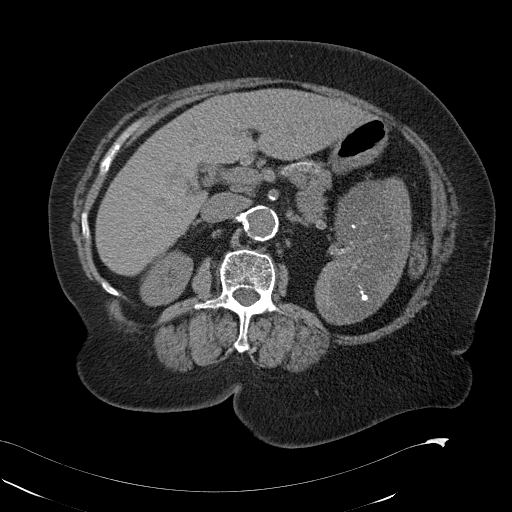
[im 60/83  bone]
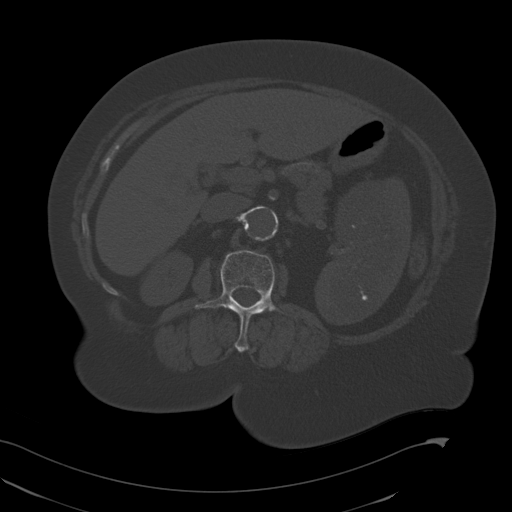
[im 66/83  soft-tissue]
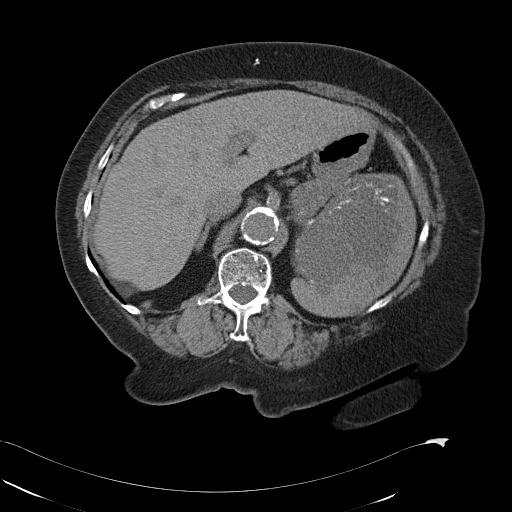
[im 73/83  soft-tissue]
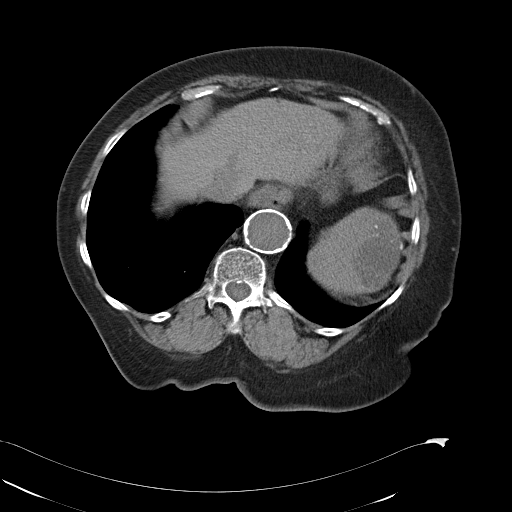
[im 79/83  soft-tissue]
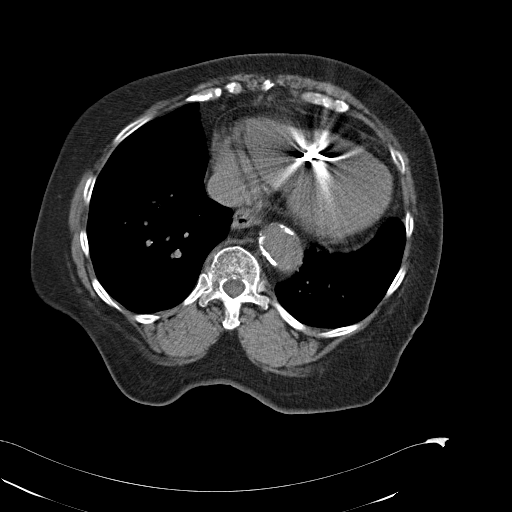

[Series 4: coronal · coronal · 0.81mm/px · 3 of 158 slices shown]
[im 53/158  soft-tissue]
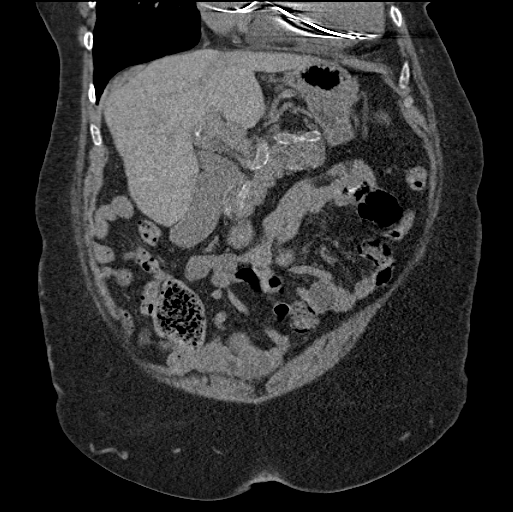
[im 70/158  soft-tissue]
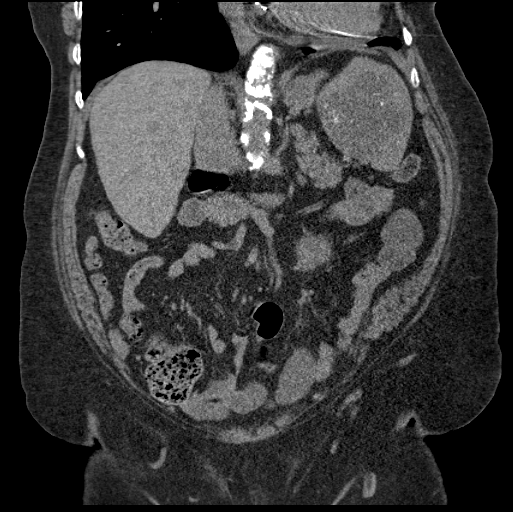
[im 88/158  soft-tissue]
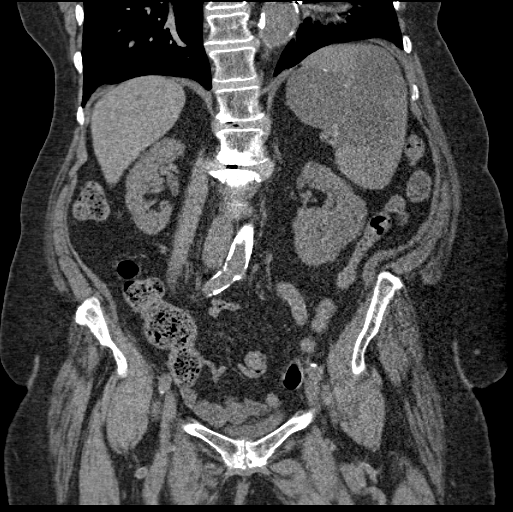

[15 of 46 positions shown; findings below may reference images not displayed]

FINDINGS: Lower chest: Atherosclerotic calcifications of thoracic aorta. Small
hiatal hernia. Mild elevation of the left hemidiaphragm. Partially
visualized cardiac pacemaker leads. Postsurgical changes post mitral
valve repair.

Hepatobiliary: Unenhanced liver shows no biliary ductal dilatation.
Small layering gallstones and gallbladder sludge noted within
gallbladder. The largest gallstone measures 4.5 mm.

Pancreas: Unenhanced pancreas is unremarkable.

Spleen: Stable complex partially calcified cystic lesions within
spleen. The largest anteriorly axial image 22 measures 7.7 cm
without significant change from prior exam.

Adrenals/Urinary Tract: No adrenal gland mass is noted. Unenhanced
kidneys shows no nephrolithiasis. There is a cyst in midpole
posterior aspect of the left kidney measures 3.6 cm. Stable from
prior exam. Tiny exophytic cyst is noted in lower pole of the right
kidney measures 8 mm. There is a cyst in midpole of the right kidney
measures 9 mm. No nephrolithiasis. No hydronephrosis or hydroureter.
No calcified ureteral calculi are noted.

Stomach/Bowel: There is no gastric outlet obstruction. No small
bowel obstruction. Axial image 61 there is probable a small bowel
lipoma in right lower anterior pelvis measures 3.1 cm without
evidence of acute complication or small bowel obstruction. There is
moderate stool are noted within cecum. No pericecal inflammation.
Normal appendix is noted in axial image 50. Moderate stool noted in
right colon. No colonic obstruction. There is no evidence of colitis
or diverticulitis. Moderate gas and stool noted within rectum.

Vascular/Lymphatic: Extensive atherosclerotic calcifications of
abdominal aorta and iliac arteries. SMA calcifications are noted.
Splenic artery calcifications are noted. Atherosclerotic
calcifications are noted bilateral renal artery origin. No
retroperitoneal or mesenteric adenopathy.

Reproductive: The uterus is atrophic. Probable small calcified
uterine fibroids. No adnexal masses noted. The urinary bladder is
under distended grossly unremarkable. No calcified calculi are noted
within urinary bladder. Pelvic phleboliths are noted.

Other: There is no ascites or free abdominal air.

Musculoskeletal: Sagittal images of the spine shows diffuse
osteopenia. Significant disc space flattening with vacuum disc
phenomenon at L1-L2 level. Significant disc space flattening with
vacuum disc phenomenon endplate sclerotic changes at L2-L3 level.
There is mild compression deformity upper endplate of L5 vertebral
body. This is stable from 12/03/2015.
IMPRESSION: 1. There is no evidence of nephrolithiasis. No hydronephrosis or
hydroureter.
2. Bilateral renal cysts.
3. No calcified ureteral calculi are noted bilaterally.
4. Stable complex partially calcified cystic lesions within spleen.
5. Extensive atherosclerotic vascular calcifications as described
above.
6. Moderate stool noted within cecum. No pericecal inflammation.
Normal appendix.
7. Probable small bowel lipoma in right lower quadrant measures
about 3.1 cm without evidence of acute complication. No evidence of
small bowel obstruction.
8. Atrophic uterus. Probable small calcified fibroids within uterus.
No adnexal mass.
9. Moderate stool and gas noted within rectum.
10. Degenerative changes and osteopenia lumbar spine.

## 2017-01-23 ENCOUNTER — Other Ambulatory Visit: Payer: Self-pay | Admitting: *Deleted

## 2017-01-23 NOTE — Patient Outreach (Signed)
[facility]) Care Management   01/23/2017  Birdia Jaycox 08-14-1932 [id number]  Ly Wass is an 83 y.o. 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  [facility] CM referral received from [place] CM in April 2018 to continue to follow for [facility] and home visit for May 2018.  She had been hospitalized 2/22-2/23/18 for exacerbation CHF and was previously active with [facility] RN health coach, D Dia Sitter.  Subjective: "I have problems with walking and staying warm"  Objective:   BP 130/82 (BP Location: Left Arm, Patient Position: Sitting, Cuff Size: Large)   Pulse 78   Temp 98 F (36.7 C) (Oral)   Resp 20   SpO2 94%   Review of Systems  Constitutional: Negative for chills, diaphoresis, fever, malaise/fatigue and weight loss.  HENT: Negative for congestion, ear discharge, ear pain, hearing loss, nosebleeds, sinus pain, sore throat and tinnitus.   Eyes: Negative for blurred vision, double vision, photophobia, pain, discharge and redness.  Respiratory: Negative for cough, hemoptysis, sputum production, shortness of breath, wheezing and stridor.   Cardiovascular: Positive for leg swelling. Negative for chest pain, palpitations, orthopnea, claudication and PND.  Gastrointestinal: Positive for constipation, diarrhea, nausea and vomiting. Negative for abdominal pain, blood in stool, heartburn and melena.  Genitourinary: Negative for dysuria, flank pain, frequency, hematuria and urgency.  Musculoskeletal: Positive for back pain and joint pain. Negative for falls, myalgias and neck pain.  Skin: Negative.  Negative for itching and rash.  Neurological: Positive for weakness. Negative for dizziness, tingling, tremors, sensory change, speech change,  focal weakness, seizures, loss of consciousness and headaches.  Endo/Heme/Allergies: Negative for environmental allergies. Bruises/bleeds easily.  Psychiatric/Behavioral: Negative for depression, hallucinations, memory loss, substance abuse and suicidal ideas. The patient is not nervous/anxious and does not have insomnia.     Physical Exam  Constitutional: She is oriented to person, place, and time. She appears well-developed and well-nourished.  HENT:  Head: Normocephalic and atraumatic.  Eyes: Conjunctivae are normal. Pupils are equal, round, and reactive to light.  Neck: Normal range of motion. Neck supple.  Cardiovascular: Normal rate, regular rhythm and normal heart sounds.   Respiratory: Effort normal and breath sounds normal.  GI: Soft. Bowel sounds are normal.  Musculoskeletal: Normal range of motion.  Neurological: She is alert and oriented to person, place, and time.  Skin: Skin is warm and dry.  Psychiatric: She has a normal mood and affect. Her behavior is normal. Judgment and thought content normal.    Encounter Medications:   Outpatient Encounter Prescriptions as of 01/23/2017  Medication Sig Note  . acetaminophen (TYLENOL) 500 MG tablet Take 500 mg by mouth every 6 (six) hours as needed (pain).   Marland Kitchen albuterol (PROVENTIL) (2.5 MG/3ML) 0.083% nebulizer solution INHALE 1 VIAL VIA NEBULIZER THREE TIMES DAILY.   Marland Kitchen albuterol (VENTOLIN HFA) 108 (90 Base) MCG/ACT inhaler Inhale 2 puffs into the lungs every 6 (six) hours as needed for wheezing or shortness of breath.   . allopurinol (ZYLOPRIM) 100 MG tablet Take 1 tablet (100 mg total) by mouth daily.   Marland Kitchen azelastine (ASTELIN) 0.1 % nasal spray Place 2 sprays into both nostrils 2 (two) times daily. Use in each nostril as directed 01/23/2017: uses as needed  . cholecalciferol (VITAMIN D) 1000 UNITS tablet Take 1,000 Units by mouth  daily.   Marland Kitchen COUMADIN 5 MG tablet Take 2 tablets daily (Patient taking differently: Take 10 mg by mouth every  evening. Take 46m on Wednesdays and Saturdays and take 128mon all other days) 10/31/2016: BRAND ONLY  . digoxin (LANOXIN) 0.125 MG tablet Take 1 tablet (0.125 mg total) by mouth every other day. 10/31/2016: BRAND ONLY  . docusate sodium (COLACE) 100 MG capsule Take 300 mg by mouth at bedtime.   . fluticasone (FLONASE) 50 MCG/ACT nasal spray USE 2 SPRAYS IN EACH       NOSTRIL ONCE DAILY (Patient taking differently: USE 2 SPRAYS IN EACH       NOSTRIL ONCE DAILY AS NEEDED FOR CONGESTION)   . folic acid (FOLVITE) 1 MG tablet Take 1 tablet (1 mg total) by mouth daily.   . furosemide (LASIX) 40 MG tablet Take 1 tablet by mouth  daily   . gabapentin (NEURONTIN) 300 MG capsule Take 1 capsule (300 mg total) by mouth 2 (two) times daily.   . hydrALAZINE (APRESOLINE) 25 MG tablet Take 1 tablet (25 mg total) by mouth 3 (three) times daily.   . Marland KitchenYDROcodone-acetaminophen (NORCO) 7.5-325 MG tablet Take 1 tablet by mouth every 6 (six) hours as needed for moderate pain (Must last 30 days.Do not drive or operate machinery while taking this medicine.).   . Marland Kitchensosorbide mononitrate (IMDUR) 60 MG 24 hr tablet take 1 tablet by mouth once daily   . losartan (COZAAR) 50 MG tablet take 1 tablet by mouth once daily   . metolazone (ZAROXOLYN) 2.5 MG tablet Take 1 tablet (2.5 mg total) by mouth once a week. 01/23/2017: Get in mail order use only on wednesdays   . ondansetron (ZOFRAN) 4 MG tablet Take 1 tablet (4 mg total) by mouth daily as needed for nausea or vomiting.   . Vladimir Fasterlycol-Propyl Glycol (SYSTANE OP) Place 1 drop into both eyes daily as needed (dry eyes).   . potassium chloride SA (KLOR-CON M20) 20 MEQ tablet Take 1 tablet by mouth  daily (Patient taking differently: Take 20 mEq by mouth daily. Take 1 tablet by mouth  daily)   . pravastatin (PRAVACHOL) 20 MG tablet Take 1 tablet (20 mg total) by mouth daily.   . promethazine-dextromethorphan (PROMETHAZINE-DM) 6.25-15 MG/5ML syrup One teaspoon at bedtime, as needed,  for excessive cough (Patient taking differently: Take 5 mLs by mouth at bedtime as needed for cough. as needed, for excessive cough)   . SYNTHROID 75 MCG tablet Take 1 tablet by mouth  daily   . TOPROL XL 100 MG 24 hr tablet Take 1 tablet (100 mg total) by mouth daily. Take with or immediately following a meal.    Facility-Administered Encounter Medications as of 01/23/2017  Medication  . epoetin alfa (EPOGEN,PROCRIT) injection 40,000 Units    Functional Status:   In your present state of health, do you have any difficulty performing the following activities: 11/30/2016 11/17/2016  Hearing? N -  Vision? N -  Difficulty concentrating or making decisions? N -  Walking or climbing stairs? Y -  Dressing or bathing? N -  Doing errands, shopping? N N  Preparing Food and eating ? N -  Using the Toilet? N -  In the past six months, have you accidently leaked urine? N -  Do you have problems with loss of bowel control? N -  Managing your Medications? N -  Managing your Finances? N -  Housekeeping or managing your Housekeeping? Y -  Some recent data might  be hidden    Fall/Depression Screening:    Fall Risk  12/25/2016 11/30/2016 11/03/2016  Falls in the past year? No No Yes  Number falls in past yr: - - -  Injury with Fall? - - -  Risk for fall due to : Impaired balance/gait;Medication side effect Impaired balance/gait;Medication side effect -  Follow up - - -   PHQ 2/9 Scores 11/30/2016 11/03/2016 10/13/2016 09/19/2016 08/08/2016 07/05/2016 04/24/2016  PHQ - 2 Score 1 0 0 0 0 0 2  PHQ- 9 Score - - - - - - 9    Assessment:   THN CM was met to the door of her nephew's home by Mrs Dimaio who escorted CM to the den.  She reported although it was warm outside she had remained cooler in the den of the home. She confirms insurance coverage of medicare and BCBS Mrs Gatz reports support from her niece and nephew now that her brother had passed this year.  Reports a move to [place] from [place] to assist in the  care of her brother on September 23 2005 prior to his passing  She shares with [facility] CM during assessment that she was given a flu shot while hospitalized and is still pending a pneumovax but is waiting to get this vaccination   form her pcp [facility] CM is followed by [facility] SW who had updated [facility] CM of call to patient on 01/16/17.  THN CM was informed she was weak, hard to stand from sitting (fall risk potential-uses cane), increased sleeping during day but has medications for dizziness, and transportation services. Mrs Assad confirmed she had a dental appointment on 12/27/16 and received some dental treatment/care.  She has scheduled medical appointment with Dr. [name] in June of 2018.    Acute medical issues (pain of right hip to knee) Reports being out of extra strength Tylenol.  She reports having a recent steroid shot in her right hip with initial   Mobility Mrs Phenix uses a cane to get around in a slow pace she is seen by Dr Luna [facility] provider.  She reports her [facility] services have concluded  Chronic medical issues CHF, DM, COPD, HTN, constipation Her chronic medical issues are being maintained with medication, mobility and diet.  She is scheduled to see Dr Bari Mantis in June 2018 an follow up at the coumadin clinic. Minimal lower extremity swelling She reports use of oxygen qhs and notes minimal wheezing upon awakening but resolves during the da.  She reports chronic constipation managed with miralax  Plan:  THN CM provide Mrs Klos with a [facility] calendar- reviewed use, Denied need for a pill box. [facility] CM assisted Mrs Marsalis with extra strength Tylenol for pain report from hip to leg with a pain level score of 8 on a scale of 1-10 with 10 being there worst pain  THN CM routed [facility] Cm notes to her providers   [facility] CM Care Plan Problem One     Most Recent Value  Care Plan Problem One  Heart Failure knowledge deficit  Role Documenting the Problem One  Care Management  [place] for Problem One  Active  THN Long Term Goal (31-90 days)  Patient will become familiar with heart failure disease process and be able to verbalize heart failure zones for self management wtihin 90 days.    THN Long Term Goal Start Date  01/15/17 [goal continued]  Interventions for Problem One Long Term Goal  RN CM reinforced CHF action plan, importance of working with home health and continuing to do exercises, energy conservation  THN CM Short Term Goal #1 (0-30 days)  knowledge deficit related to low sodium diet  THN CM Short Term Goal #1 Start Date  01/15/17  Interventions for Short Term Goal #1  RN CM reiterated foods high in sodium to limit, avoid, reviewed correlation to high sodium diet and fluid retention.  THN CM Short Term Goal #2 (0-30 days)  Decreased endurance  THN CM Short Term Goal #2 Start Date  12/25/16  [facility] CM Short Term Goal #2 Met Date  01/15/17  Interventions for Short Term Goal #2  RN CM ask pt to walk several times daily, short intervals throughout her house       [place] L. Lavina [name], RN, BSN, [place] Care Management [phone]

## 2017-01-24 ENCOUNTER — Ambulatory Visit (INDEPENDENT_AMBULATORY_CARE_PROVIDER_SITE_OTHER): Payer: Medicare Other | Admitting: *Deleted

## 2017-01-24 DIAGNOSIS — I255 Ischemic cardiomyopathy: Secondary | ICD-10-CM | POA: Diagnosis not present

## 2017-01-24 DIAGNOSIS — N2581 Secondary hyperparathyroidism of renal origin: Secondary | ICD-10-CM | POA: Diagnosis not present

## 2017-01-24 DIAGNOSIS — Z5181 Encounter for therapeutic drug level monitoring: Secondary | ICD-10-CM

## 2017-01-24 DIAGNOSIS — N184 Chronic kidney disease, stage 4 (severe): Secondary | ICD-10-CM | POA: Diagnosis not present

## 2017-01-24 DIAGNOSIS — Z952 Presence of prosthetic heart valve: Secondary | ICD-10-CM | POA: Diagnosis not present

## 2017-01-24 DIAGNOSIS — E669 Obesity, unspecified: Secondary | ICD-10-CM | POA: Diagnosis not present

## 2017-01-24 DIAGNOSIS — I10 Essential (primary) hypertension: Secondary | ICD-10-CM | POA: Diagnosis not present

## 2017-01-24 LAB — POCT INR: INR: 5.3

## 2017-01-26 IMAGING — DX DG CHEST 2V
2 series · 2 of 2 positions shown · non-contrast
Comparison: PA and lateral chest x-ray April 10, 2015

CLINICAL DATA: Three weeks of cough and shortness of breath

EXAM:
CHEST  2 VIEW

[chest pa]
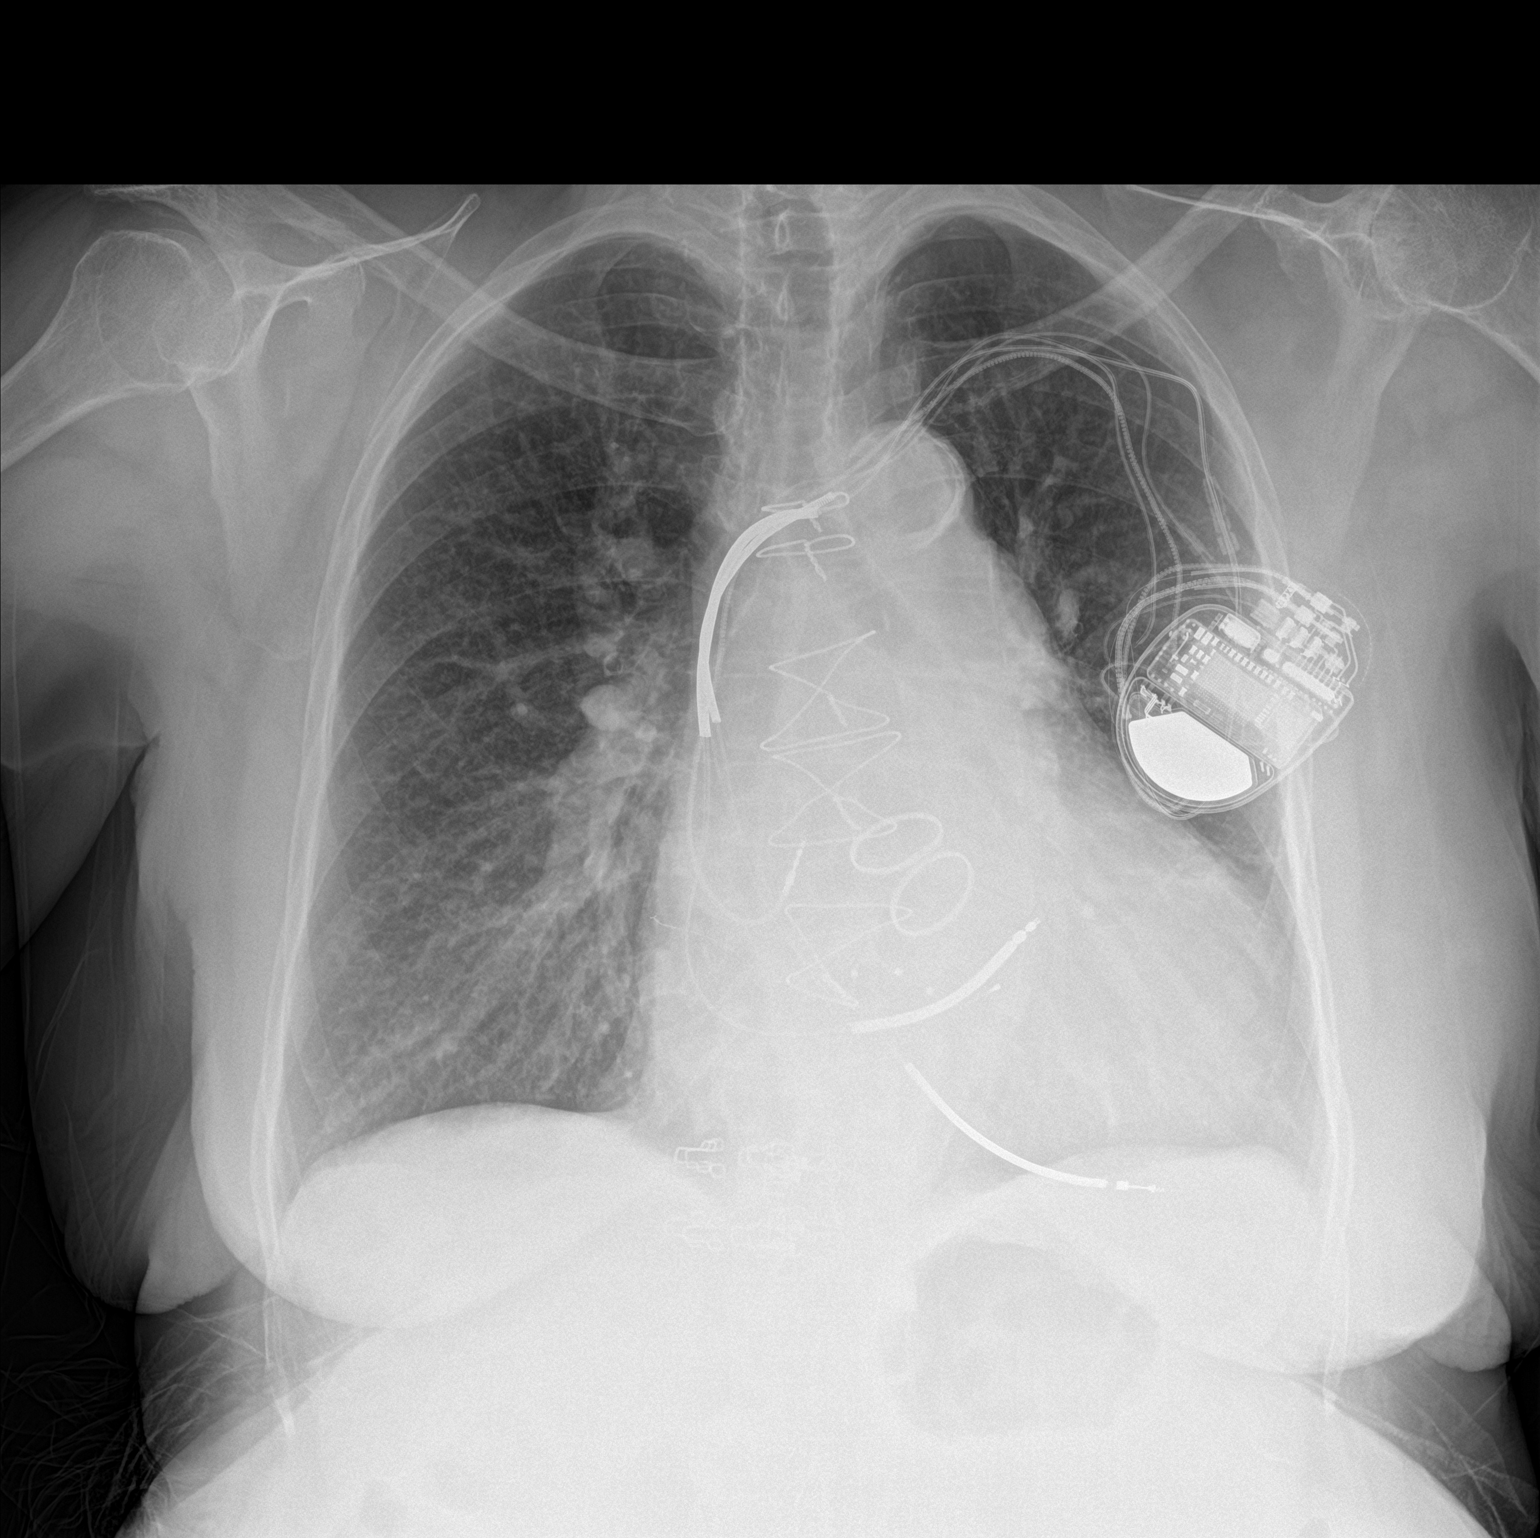

[chest lat]
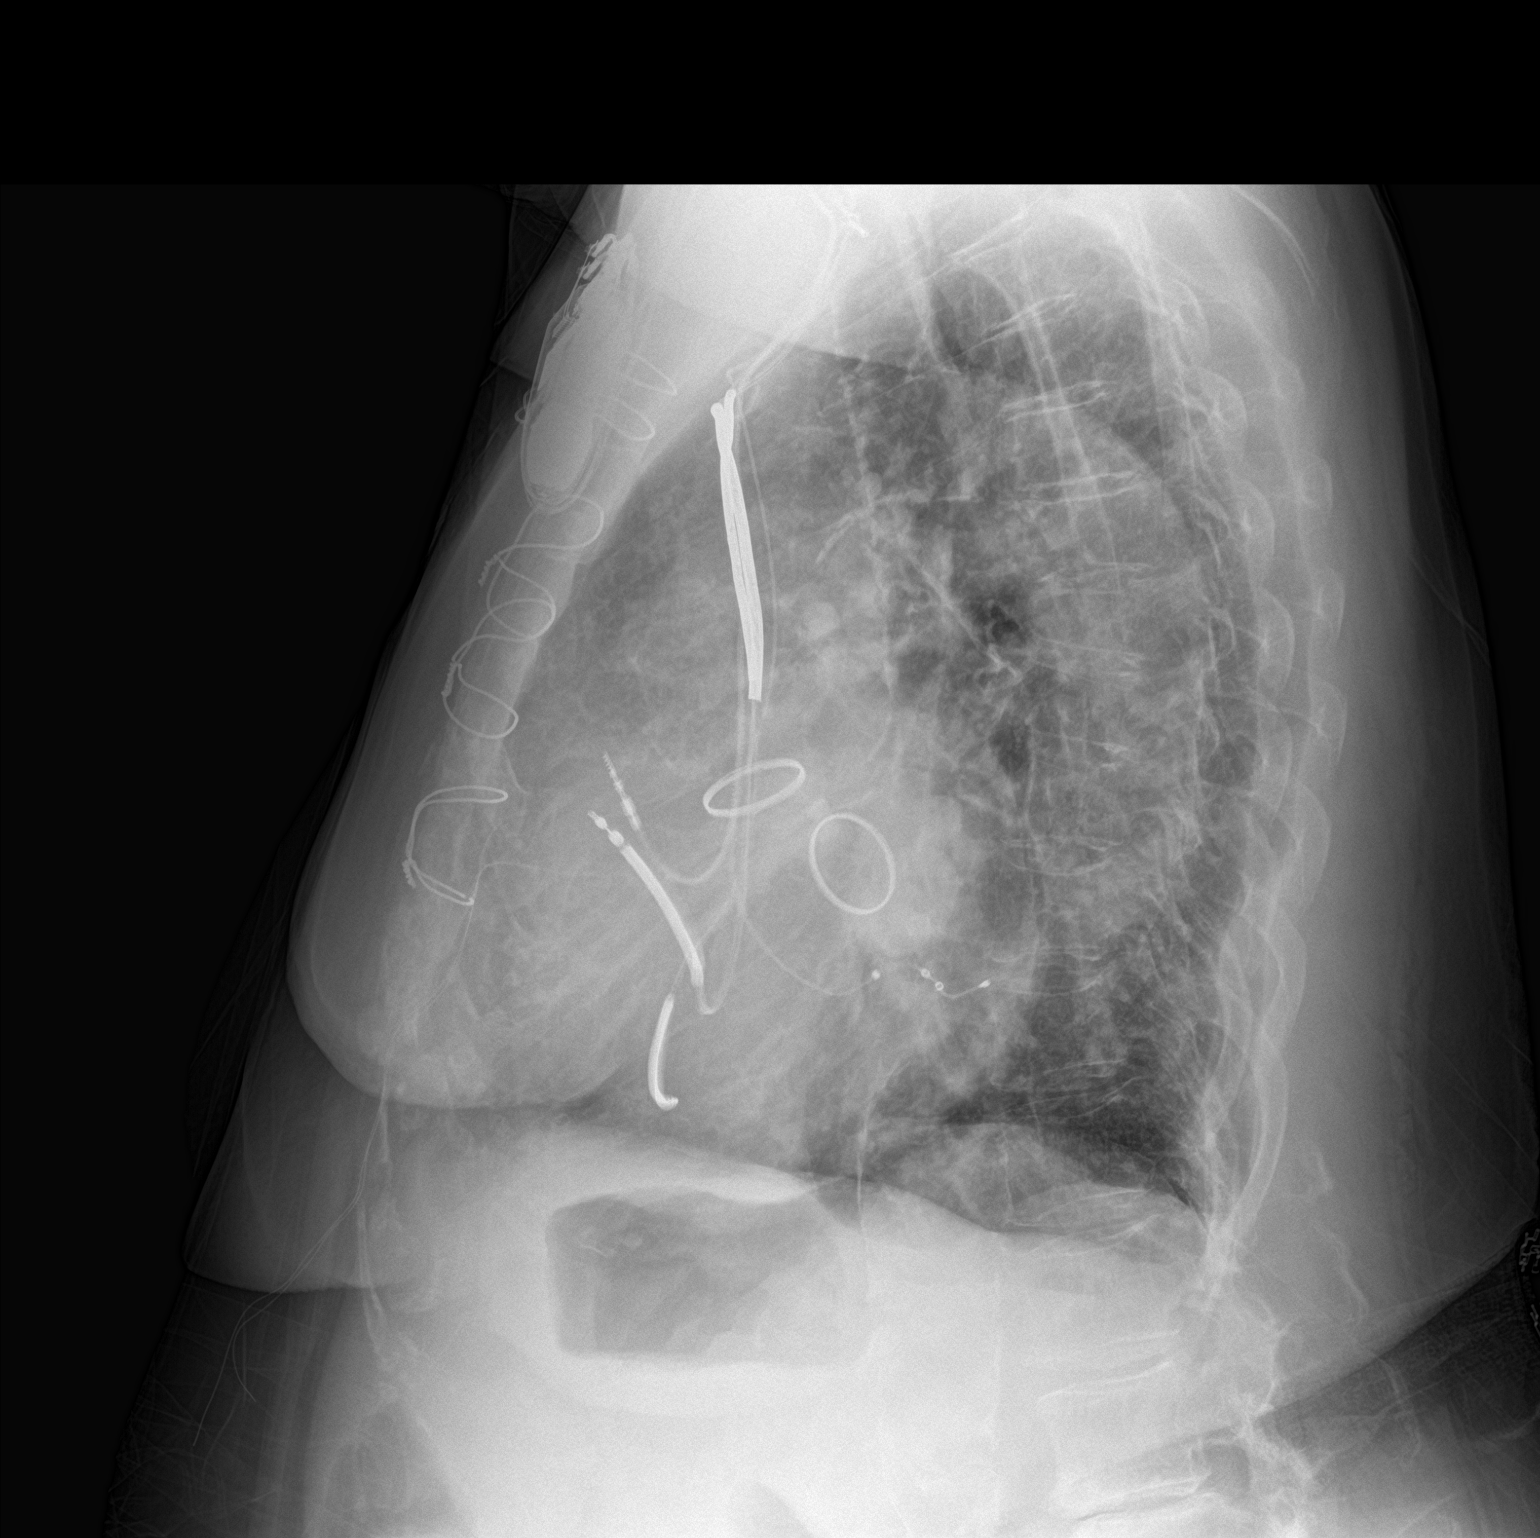

[2 of 2 positions shown; findings below may reference images not displayed]

FINDINGS: The lungs remain hyperinflated. The interstitial markings remain
coarse. The cardiac silhouette remains enlarged. The pulmonary
vascularity is engorged. The permanent pacemaker defibrillator is in
stable position. The patient has undergone previous aortic and
mitral valve replacement. There is aortic atherosclerosis. The
sternal wires are intact. The observed bony thorax exhibits mild
loss of height of T7 which is stable.
IMPRESSION: 1. COPD with superimposed mild CHF.  Stable cardiomegaly.
2. Aortic atherosclerosis.
3. Stable appearance of the permanent pacemaker defibrillator.

## 2017-01-31 ENCOUNTER — Ambulatory Visit (INDEPENDENT_AMBULATORY_CARE_PROVIDER_SITE_OTHER): Payer: Medicare Other | Admitting: *Deleted

## 2017-01-31 ENCOUNTER — Encounter (HOSPITAL_COMMUNITY): Payer: Medicare Other | Attending: Oncology

## 2017-01-31 ENCOUNTER — Encounter (HOSPITAL_COMMUNITY): Payer: Medicare Other

## 2017-01-31 ENCOUNTER — Encounter (HOSPITAL_COMMUNITY): Payer: Self-pay

## 2017-01-31 VITALS — BP 124/52 | HR 73 | Temp 96.0°F | Resp 18

## 2017-01-31 DIAGNOSIS — E1122 Type 2 diabetes mellitus with diabetic chronic kidney disease: Secondary | ICD-10-CM | POA: Diagnosis not present

## 2017-01-31 DIAGNOSIS — I255 Ischemic cardiomyopathy: Secondary | ICD-10-CM

## 2017-01-31 DIAGNOSIS — D509 Iron deficiency anemia, unspecified: Secondary | ICD-10-CM | POA: Diagnosis not present

## 2017-01-31 DIAGNOSIS — Z952 Presence of prosthetic heart valve: Secondary | ICD-10-CM | POA: Diagnosis not present

## 2017-01-31 DIAGNOSIS — Z87891 Personal history of nicotine dependence: Secondary | ICD-10-CM | POA: Diagnosis not present

## 2017-01-31 DIAGNOSIS — Z5181 Encounter for therapeutic drug level monitoring: Secondary | ICD-10-CM

## 2017-01-31 DIAGNOSIS — Z79899 Other long term (current) drug therapy: Secondary | ICD-10-CM | POA: Insufficient documentation

## 2017-01-31 DIAGNOSIS — N184 Chronic kidney disease, stage 4 (severe): Secondary | ICD-10-CM | POA: Insufficient documentation

## 2017-01-31 DIAGNOSIS — I442 Atrioventricular block, complete: Secondary | ICD-10-CM | POA: Diagnosis not present

## 2017-01-31 DIAGNOSIS — D649 Anemia, unspecified: Secondary | ICD-10-CM

## 2017-01-31 DIAGNOSIS — E785 Hyperlipidemia, unspecified: Secondary | ICD-10-CM | POA: Diagnosis not present

## 2017-01-31 DIAGNOSIS — D631 Anemia in chronic kidney disease: Secondary | ICD-10-CM | POA: Diagnosis not present

## 2017-01-31 DIAGNOSIS — G8929 Other chronic pain: Secondary | ICD-10-CM | POA: Insufficient documentation

## 2017-01-31 DIAGNOSIS — M25561 Pain in right knee: Secondary | ICD-10-CM | POA: Diagnosis not present

## 2017-01-31 DIAGNOSIS — E611 Iron deficiency: Secondary | ICD-10-CM

## 2017-01-31 LAB — CBC
HCT: 34.5 % — ABNORMAL LOW (ref 36.0–46.0)
Hemoglobin: 10.8 g/dL — ABNORMAL LOW (ref 12.0–15.0)
MCH: 30.2 pg (ref 26.0–34.0)
MCHC: 31.3 g/dL (ref 30.0–36.0)
MCV: 96.4 fL (ref 78.0–100.0)
Platelets: 172 10*3/uL (ref 150–400)
RBC: 3.58 MIL/uL — ABNORMAL LOW (ref 3.87–5.11)
RDW: 15.6 % — ABNORMAL HIGH (ref 11.5–15.5)
WBC: 5.4 10*3/uL (ref 4.0–10.5)

## 2017-01-31 LAB — POCT INR: INR: 2.3

## 2017-01-31 LAB — FERRITIN: Ferritin: 848 ng/mL — ABNORMAL HIGH (ref 11–307)

## 2017-01-31 MED ORDER — EPOETIN ALFA 40000 UNIT/ML IJ SOLN
40000.0000 [IU] | Freq: Once | INTRAMUSCULAR | Status: AC
  Administered 2017-01-31: 40000 [IU] via SUBCUTANEOUS
  Filled 2017-01-31: qty 1

## 2017-01-31 NOTE — Progress Notes (Signed)
[name] [name] presents today for injection per the provider's orders.  Aranesp administration without incident; see MAR for injection details.  Patient tolerated procedure well and without incident.  No questions or complaints noted at this time.

## 2017-01-31 NOTE — Patient Instructions (Signed)
[facility] at [facility] Discharge Instructions  RECOMMENDATIONS MADE BY THE CONSULTANT AND ANY TEST RESULTS WILL BE SENT TO YOUR REFERRING PHYSICIAN.  Procrit injection today. Return as scheduled.   Thank you for choosing [facility] at [facility] to provide your oncology and hematology care.  To afford each patient quality time with our provider, please arrive at least 15 minutes before your scheduled appointment time.    If you have a lab appointment with the Cancer Center please come in thru the  Main Entrance and check in at the main information desk  You need to re-schedule your appointment should you arrive 10 or more minutes late.  We strive to give you quality time with our providers, and arriving late affects you and other patients whose appointments are after yours.  Also, if you no show three or more times for appointments you may be dismissed from the clinic at the providers discretion.     Again, thank you for choosing [facility].  Our hope is that these requests will decrease the amount of time that you wait before being seen by our physicians.       _____________________________________________________________  Should you have questions after your visit to [facility], please contact our office at [phone] between the hours of 8:30 a.m. and 4:30 p.m.  Voicemails left after 4:30 p.m. will not be returned until the following business day.  For prescription refill requests, have your pharmacy contact our office.       Resources For Cancer Patients and their Caregivers ? American Cancer Society: Can assist with transportation, wigs, general needs, runs Look Good Feel Better.        1-888-227-6333 ? Cancer Care: Provides financial assistance, online support groups, medication/co-pay assistance.  1-800-813-HOPE (4673) ? [facility] Assists [place] cancer patients and  their families through emotional , educational and financial support.  336-427-4357 ? [place] DSS Where to apply for food stamps, Medicaid and utility assistance. 336-342-1394 ? [facility]: Transportation to medical appointments. 336-347-2287 ? Social Security Administration: May apply for disability if have a Stage IV cancer. 336-342-7796 1-800-772-1213 ? [place] Aging, Disability and Transit Services: Assists with nutrition, care and transit needs. 336-349-2343  Cancer Center Support Programs: @10RELATIVEDAYS@ > Cancer Support Group  2nd Tuesday of the month 1pm-2pm, Journey Room  > Creative Journey  3rd Tuesday of the month 1130am-1pm, Journey Room  > Look Good Feel Better  1st Wednesday of the month 10am-12 noon, Journey Room (Call American Cancer Society to register 1-800-395-5775)   

## 2017-02-01 ENCOUNTER — Other Ambulatory Visit: Payer: Self-pay

## 2017-02-01 DIAGNOSIS — J449 Chronic obstructive pulmonary disease, unspecified: Secondary | ICD-10-CM | POA: Diagnosis not present

## 2017-02-01 DIAGNOSIS — I10 Essential (primary) hypertension: Secondary | ICD-10-CM | POA: Diagnosis not present

## 2017-02-01 DIAGNOSIS — N184 Chronic kidney disease, stage 4 (severe): Secondary | ICD-10-CM | POA: Diagnosis not present

## 2017-02-01 DIAGNOSIS — I779 Disorder of arteries and arterioles, unspecified: Secondary | ICD-10-CM | POA: Diagnosis not present

## 2017-02-01 MED ORDER — TOPROL XL 100 MG PO TB24: 100.0000 mg | ORAL_TABLET | Freq: Every day | ORAL | 1 refills | Status: DC

## 2017-02-02 ENCOUNTER — Other Ambulatory Visit: Payer: Self-pay | Admitting: *Deleted

## 2017-02-02 NOTE — Patient Outreach (Signed)
[facility]) Care Management  02/02/2017  [name] [name] 06-05-1932 [id number]   Care Coordination  [facility] CM reached [name] [name] to follow up with her and she reports she is doing fair, no distress assessed Reports she has not been outside today and is scheduled for a follow up appointment on 02/08/17   Plans:  [facility] CM will follow up with a home visit next week   [name] [name]. Lavina [name], RN, BSN, [place] Care Management [phone]

## 2017-02-05 ENCOUNTER — Other Ambulatory Visit: Payer: Self-pay

## 2017-02-05 MED ORDER — SYNTHROID 75 MCG PO TABS: ORAL_TABLET | ORAL | 1 refills | Status: DC

## 2017-02-06 ENCOUNTER — Telehealth: Payer: Self-pay

## 2017-02-06 NOTE — Telephone Encounter (Signed)
Called and spoke to pt, does not need a new rx

## 2017-02-08 ENCOUNTER — Ambulatory Visit (INDEPENDENT_AMBULATORY_CARE_PROVIDER_SITE_OTHER): Payer: Medicare Other | Admitting: Internal Medicine

## 2017-02-08 ENCOUNTER — Encounter: Payer: Self-pay | Admitting: Internal Medicine

## 2017-02-08 ENCOUNTER — Other Ambulatory Visit: Payer: Self-pay | Admitting: *Deleted

## 2017-02-08 VITALS — BP 138/75 | HR 77 | Ht 67.0 in | Wt 222.0 lb

## 2017-02-08 DIAGNOSIS — I5022 Chronic systolic (congestive) heart failure: Secondary | ICD-10-CM | POA: Diagnosis not present

## 2017-02-08 DIAGNOSIS — Z95810 Presence of automatic (implantable) cardiac defibrillator: Secondary | ICD-10-CM

## 2017-02-08 DIAGNOSIS — I255 Ischemic cardiomyopathy: Secondary | ICD-10-CM

## 2017-02-08 NOTE — Progress Notes (Signed)
HPI [name] [name] returns today for followup. 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 Her old ICD lead had broken. In the interim, she has had some pain in her right side with leg and back pain. No syncope.    Current Outpatient Prescriptions  Medication Sig Dispense Refill  . acetaminophen (TYLENOL) 500 MG tablet Take 500 mg by mouth every 6 (six) hours as needed (pain).    Marland Kitchen albuterol (PROVENTIL) (2.5 MG/3ML) 0.083% nebulizer solution INHALE 1 VIAL VIA NEBULIZER THREE TIMES DAILY. 90 vial 5  . albuterol (VENTOLIN HFA) 108 (90 Base) MCG/ACT inhaler Inhale 2 puffs into the lungs every 6 (six) hours as needed for wheezing or shortness of breath. 3 Inhaler 3  . allopurinol (ZYLOPRIM) 100 MG tablet Take 1 tablet (100 mg total) by mouth daily. 90 tablet 1  . azelastine (ASTELIN) 0.1 % nasal spray Place 2 sprays into both nostrils 2 (two) times daily. Use in each nostril as directed 30 mL 12  . cholecalciferol (VITAMIN D) 1000 UNITS tablet Take 1,000 Units by mouth daily.    Marland Kitchen COUMADIN 5 MG tablet Take 2 tablets daily (Patient taking differently: Take 10 mg by mouth every evening. Take 5mg  on Wednesdays and Saturdays and take 10mg  on all other days) 10 tablet 1  . digoxin (LANOXIN) 0.125 MG tablet Take 1 tablet (0.125 mg total) by mouth every other day. 90 tablet 3  . docusate sodium (COLACE) 100 MG capsule Take 300 mg by mouth at bedtime.    . fluticasone (FLONASE) 50 MCG/ACT nasal spray USE 2 SPRAYS IN EACH       NOSTRIL ONCE DAILY (Patient taking differently: USE 2 SPRAYS IN EACH       NOSTRIL ONCE DAILY AS NEEDED FOR CONGESTION) 48 g 1  . folic acid (FOLVITE) 1 MG tablet Take 1 tablet (1 mg total) by mouth daily. 100 tablet 3  . furosemide (LASIX) 40 MG tablet Take 1  tablet by mouth  daily 90 tablet 1  . gabapentin (NEURONTIN) 300 MG capsule Take 1 capsule (300 mg total) by mouth 2 (two) times daily. 180 capsule 1  . hydrALAZINE (APRESOLINE) 25 MG tablet Take 1 tablet (25 mg total) by mouth 3 (three) times daily. 270 tablet 3  . HYDROcodone-acetaminophen (NORCO) 7.5-325 MG tablet Take 1 tablet by mouth every 6 (six) hours as needed for moderate pain (Must last 30 days.Do not drive or operate machinery while taking this medicine.). 80 tablet 0  . isosorbide mononitrate (IMDUR) 60 MG 24 hr tablet take 1 tablet by mouth once daily 90 tablet 1  . losartan (COZAAR) 50 MG tablet take 1 tablet by mouth once daily 90 tablet 1  . metolazone (ZAROXOLYN) 2.5 MG tablet Take 1 tablet (2.5 mg total) by mouth once a week. 12 tablet 2  . ondansetron (ZOFRAN) 4 MG tablet Take 1 tablet (4 mg total) by mouth daily as needed for nausea or vomiting. 90 tablet 0  . Polyethyl [name] (SYSTANE OP) Place 1 drop into both eyes daily as needed (dry eyes).    . potassium chloride SA (KLOR-CON M20) 20 MEQ tablet Take 1 tablet by mouth  daily (Patient taking differently: Take 20 mEq by mouth daily. Take 1 tablet by mouth  daily) 30 tablet 0  . pravastatin (PRAVACHOL) 20 MG tablet Take 1 tablet (20 mg total) by mouth daily. 90 tablet 1  . promethazine-dextromethorphan (PROMETHAZINE-DM) 6.25-15 MG/5ML syrup One teaspoon at bedtime, as needed, for excessive cough (Patient taking differently: Take 5 mLs by mouth at bedtime as needed for cough. as needed, for excessive cough) 180 mL 0  . SYNTHROID 75 MCG tablet Take 1 tablet by mouth  daily 90 tablet 1  . TOPROL XL 100 MG 24 hr tablet Take 1 tablet (100 mg total) by mouth daily. Take with or immediately following a meal. 90 tablet 1   No current facility-administered medications for this visit.    Facility-Administered Medications Ordered in Other Visits  Medication Dose Route Frequency Provider Last Rate Last Dose  . epoetin  alfa (EPOGEN,PROCRIT) injection 40,000 Units  40,000 Units Subcutaneous Once Farrel Gobble, MD         Past Medical History:  Diagnosis Date  . Adenomatous polyp of colon 12/16/2002   Dr. [name]/[facility]  . Allergy   . Cataract   . CHB (complete heart block) ([place]) 10/07/2014      . CHF (congestive heart failure) ([place])       . Chronic kidney disease, stage I 2006  . Constipation       . COPD (chronic obstructive pulmonary disease) ([place])       . DJD (degenerative joint disease) of lumbar spine   . DM (diabetes mellitus) ([place]) 2006  . Esophageal reflux   . Glaucoma   . Gout       . Heart murmur   . Hyperlipemia   . IDA (iron deficiency anemia)    Parenteral iron/[name]  . Insomnia       . Non-ischemic cardiomyopathy ([place])    a. s/p MDT CRTD  . Obesity, unspecified   . Other and unspecified hyperlipidemia   . Oxygen deficiency 2014   nocturnal;  . Spondylolisthesis   . Spondylosis   . Thyroid cancer ([place])    remote thyroidectomy, no recurrence, pt denies in 2016 thyroid cancer  . Unspecified hypothyroidism       . 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 Vertigo         ROS:   All systems reviewed and negative except as noted in the HPI.   Past Surgical History:  Procedure Laterality Date  . AORTIC AND MITRAL VALVE REPLACEMENT  2001  . BI-VENTRICULAR IMPLANTABLE CARDIOVERTER DEFIBRILLATOR UPGRADE N/A 01/18/2015   Procedure: BI-VENTRICULAR IMPLANTABLE CARDIOVERTER DEFIBRILLATOR UPGRADE;  Surgeon: Evans Lance, MD;  Location: [facility] CATH LAB;  Service: Cardiovascular;  Laterality: N/A;  . CARDIAC CATHETERIZATION    . CARDIAC VALVE REPLACEMENT    . CARDIOVERSION N/A 11/13/2016   Procedure: CARDIOVERSION;  Surgeon: Sanda Klein, MD;  Location: [facility] ENDOSCOPY;  Service: Cardiovascular;  Laterality: N/A;  . COLONOSCOPY  06/12/2007   Rourk- Normal rectum/Normal colon  . COLONOSCOPY  12/16/02   small  hemorrhoids  . COLONOSCOPY  06/20/2012   Procedure: COLONOSCOPY;  Surgeon: Daneil Dolin, MD;  Location: [facility] ENDO SU[facility];  Service: Endoscopy;  Laterality: N/A;  10:45  . defibrillator implanted 2006    . DOPPLER ECHOCARDIOGRAPHY  2012  . DOPPLER ECHOCARDIOGRAPHY  05,06,07,08,09,2011  . ESOPHAGOGASTRODUODENOSCOPY  06/12/2007   Rourk- normal esophagus, small hiatal hernia, otherwise normal stomach, D1, D2  . EYE SURGERY Right 2005  . EYE SURGERY Left 2006  . ICD LEAD REMOVAL Left  02/08/2015   Procedure: ICD LEAD REMOVAL;  Surgeon: Evans Lance, MD;  Location: [place];  Service: Cardiovascular;  Laterality: Left;  "Will plan extraction and insertion of a BiV PM"  **Dr. Roxan Hockey backing up case**  . IMPLANTABLE CARDIOVERTER DEFIBRILLATOR (ICD) GENERATOR CHANGE Left 02/08/2015   Procedure: ICD GENERATOR CHANGE;  Surgeon: Evans Lance, MD;  Location: [place];  Service: Cardiovascular;  Laterality: Left;  . INSERT / REPLACE / REMOVE PACEMAKER    . PACEMAKER INSERTION  May 2016  . pacemaker placed  2004  . right breast cyst removed     benign   . right cataract removed     2005  . TEE WITHOUT CARDIOVERSION N/A 11/13/2016   Procedure: TRANSESOPHAGEAL ECHOCARDIOGRAM (TEE);  Surgeon: Sanda Klein, MD;  Location[facility] ENDOSCOPY;  Service: Cardiovascular;  Laterality: N/A;  . THYROIDECTOMY       Family History  Problem Relation Age of Onset  . Cancer Mother        behind pancres  . Heart disease Father   . Heart disease Sister        Heart attack  . Heart disease Brother   . Diabetes Brother   . Heart disease Brother   . Diabetes Brother   . Hypertension Brother   . Cancer Brother        lung     Social History   Social History  . Marital status: Widowed    Spouse name: N/A  . Number of children: 0  . Years of education: N/A   Occupational History  . retired Retired   Social History Main Topics  . Smoking status: Former Smoker    Packs/day: 0.75    Years: 40.00     Types: Cigarettes    Quit date: 03/08/1987  . Smokeless tobacco: Never Used  . Alcohol use No  . Drug use: No  . Sexual activity: Not Currently   Other Topics Concern  . Not on file   Social History Narrative   From [place] (2004)     BP 138/75   Pulse 77   Ht 5\' 7"  (1.702 m)   Wt 222 lb (100.7 kg)   SpO2 97%   BMI 34.77 kg/m   Physical Exam:  stable appearing 83 yo man, NAD HEENT: Unremarkable Neck:  6 cm JVD, no thyromegally Back:  No CVA tenderness Lungs:  Clear with no wheezes HEART:  Regular rate rhythm, no murmurs, no rubs, no clicks Abd:  soft, positive bowel sounds, no organomegally, no rebound, no guarding Ext:  2 plus pulses, no edema, no cyanosis, no clubbing Skin:  No rashes no nodules Neuro:  CN II through XII intact, motor grossly intact  DEVICE  Normal device function.  See PaceArt for details.   Assess/Plan: 1. Chronic systolic heart failure - her symptoms are class 2. She will continue her current meds. I have encouraged her to increase her physical activity. She will maintain a low sodium diet. 2. HTN - her blood pressure is well controlled.  3. ICD - Her medtronic Biv device is working normally. Will recheck in several months.  Mikle Bosworth.D.

## 2017-02-08 NOTE — Patient Outreach (Signed)
[facility]) Care Management   02/08/2017  Kariana Wiles 1932-06-26 [id number]  Kenasia Scheller is an 83 y.o. female female (widowed) with PMH of multiple valve replacement on coumadin, AICD, complete heart block, systolic CHF, COPD, DM, HTN, chronic kidney disease stage 1, constipation, DDD, cataract, glaucoma, gout, HDL, heart murmur, insomnia and thyroid cancer with thyroidectomy, Iron deficiency anemia.  [facility] CM referral received from [place] CM in April 2018 to continue to follow for [facility] and home visit for May 2018.  She had been hospitalized 2/22-2/23/18 for exacerbation CHF and was previously active with [facility] RN health coach, [name]  Subjective:  " I been out today to the doctor's "  Objective:   BP 110/70 (BP Location: Right Arm, Patient Position: Sitting, Cuff Size: Large)   Pulse 65   SpO2 95%   Review of Systems  Constitutional: Negative for chills, diaphoresis, fever, malaise/fatigue and weight loss.  HENT: Negative for congestion, ear discharge, ear pain, hearing loss, nosebleeds, sinus pain, sore throat and tinnitus.   Eyes: Negative for blurred vision, double vision, photophobia, pain, discharge and redness.  Respiratory: Negative for cough, hemoptysis, sputum production, shortness of breath, wheezing and stridor.   Cardiovascular: Positive for leg swelling. Negative for chest pain, palpitations, orthopnea, claudication and PND.  Gastrointestinal: Negative for abdominal pain, blood in stool, constipation, diarrhea, heartburn, melena, nausea and vomiting.  Genitourinary: Negative for dysuria, flank pain, frequency, hematuria and urgency.  Musculoskeletal: Positive for myalgias. Negative for back pain, falls, joint pain and neck pain.  Skin: Negative for itching and rash.  Neurological: Positive for weakness. Negative for dizziness, tingling, tremors, sensory change, speech change, focal weakness, seizures, loss of consciousness and headaches.   Endo/Heme/Allergies: Negative for environmental allergies and polydipsia. Does not bruise/bleed easily.  Psychiatric/Behavioral: Negative for depression, hallucinations, memory loss, substance abuse and suicidal ideas. The patient is not nervous/anxious and does not have insomnia.     Physical Exam  Constitutional: She is oriented to person, place, and time. She appears well-developed and well-nourished.  HENT:  Head: Normocephalic and atraumatic.  Eyes: Conjunctivae are normal. Pupils are equal, round, and reactive to light.  Neck: Normal range of motion. Neck supple.  Cardiovascular: Normal rate, regular rhythm and normal heart sounds.   Respiratory: Effort normal and breath sounds normal.  GI: Soft. Bowel sounds are normal.  Musculoskeletal: She exhibits edema and tenderness.  Neurological: She is alert and oriented to person, place, and time.  Skin: Skin is warm and dry.  Psychiatric: She has a normal mood and affect. Her behavior is normal. Judgment and thought content normal.    Encounter Medications:   Outpatient Encounter Prescriptions as of 02/08/2017  Medication Sig Note  . acetaminophen (TYLENOL) 500 MG tablet Take 500 mg by mouth every 6 (six) hours as needed (pain).   Marland Kitchen albuterol (PROVENTIL) (2.5 MG/3ML) 0.083% nebulizer solution INHALE 1 VIAL VIA NEBULIZER THREE TIMES DAILY.   Marland Kitchen albuterol (VENTOLIN HFA) 108 (90 Base) MCG/ACT inhaler Inhale 2 puffs into the lungs every 6 (six) hours as needed for wheezing or shortness of breath.   . allopurinol (ZYLOPRIM) 100 MG tablet Take 1 tablet (100 mg total) by mouth daily.   Marland Kitchen azelastine (ASTELIN) 0.1 % nasal spray Place 2 sprays into both nostrils 2 (two) times daily. Use in each nostril as directed 01/23/2017: uses as needed  . cholecalciferol (VITAMIN D) 1000 UNITS tablet Take 1,000 Units by mouth daily.   Marland Kitchen COUMADIN 5 MG tablet Take 2  tablets daily (Patient taking differently: Take 10 mg by mouth every evening. Take 53m on  Wednesdays and Saturdays and take 121mon all other days) 10/31/2016: BRAND ONLY  . digoxin (LANOXIN) 0.125 MG tablet Take 1 tablet (0.125 mg total) by mouth every other day. 10/31/2016: BRAND ONLY  . docusate sodium (COLACE) 100 MG capsule Take 300 mg by mouth at bedtime.   . fluticasone (FLONASE) 50 MCG/ACT nasal spray USE 2 SPRAYS IN EACH       NOSTRIL ONCE DAILY (Patient taking differently: USE 2 SPRAYS IN EACH       NOSTRIL ONCE DAILY AS NEEDED FOR CONGESTION)   . folic acid (FOLVITE) 1 MG tablet Take 1 tablet (1 mg total) by mouth daily.   . furosemide (LASIX) 40 MG tablet Take 1 tablet by mouth  daily   . gabapentin (NEURONTIN) 300 MG capsule Take 1 capsule (300 mg total) by mouth 2 (two) times daily.   . hydrALAZINE (APRESOLINE) 25 MG tablet Take 1 tablet (25 mg total) by mouth 3 (three) times daily.   . Marland KitchenYDROcodone-acetaminophen (NORCO) 7.5-325 MG tablet Take 1 tablet by mouth every 6 (six) hours as needed for moderate pain (Must last 30 days.Do not drive or operate machinery while taking this medicine.).   . Marland Kitchensosorbide mononitrate (IMDUR) 60 MG 24 hr tablet take 1 tablet by mouth once daily   . losartan (COZAAR) 50 MG tablet take 1 tablet by mouth once daily   . metolazone (ZAROXOLYN) 2.5 MG tablet Take 1 tablet (2.5 mg total) by mouth once a week. 01/23/2017: Get in mail order use only on wednesdays   . ondansetron (ZOFRAN) 4 MG tablet Take 1 tablet (4 mg total) by mouth daily as needed for nausea or vomiting.   . Vladimir Fasterlycol-Propyl Glycol (SYSTANE OP) Place 1 drop into both eyes daily as needed (dry eyes).   . potassium chloride SA (KLOR-CON M20) 20 MEQ tablet Take 1 tablet by mouth  daily (Patient taking differently: Take 20 mEq by mouth daily. Take 1 tablet by mouth  daily)   . pravastatin (PRAVACHOL) 20 MG tablet Take 1 tablet (20 mg total) by mouth daily.   . promethazine-dextromethorphan (PROMETHAZINE-DM) 6.25-15 MG/5ML syrup One teaspoon at bedtime, as needed, for excessive cough  (Patient taking differently: Take 5 mLs by mouth at bedtime as needed for cough. as needed, for excessive cough)   . SYNTHROID 75 MCG tablet Take 1 tablet by mouth  daily   . TOPROL XL 100 MG 24 hr tablet Take 1 tablet (100 mg total) by mouth daily. Take with or immediately following a meal.    Facility-Administered Encounter Medications as of 02/08/2017  Medication  . epoetin alfa (EPOGEN,PROCRIT) injection 40,000 Units    Functional Status:   In your present state of health, do you have any difficulty performing the following activities: 11/30/2016 11/17/2016  Hearing? N -  Vision? N -  Difficulty concentrating or making decisions? N -  Walking or climbing stairs? Y -  Dressing or bathing? N -  Doing errands, shopping? N N  Preparing Food and eating ? N -  Using the Toilet? N -  In the past six months, have you accidently leaked urine? N -  Do you have problems with loss of bowel control? N -  Managing your Medications? N -  Managing your Finances? N -  Housekeeping or managing your Housekeeping? Y -  Some recent data might be hidden    Fall/Depression Screening:  Fall Risk  12/25/2016 11/30/2016 11/03/2016  Falls in the past year? No No Yes  Number falls in past yr: - - -  Injury with Fall? - - -  Risk for fall due to : Impaired balance/gait;Medication side effect Impaired balance/gait;Medication side effect -  Follow up - - -   PHQ 2/9 Scores 11/30/2016 11/03/2016 10/13/2016 09/19/2016 08/08/2016 07/05/2016 04/24/2016  PHQ - 2 Score 1 0 0 0 0 0 2  PHQ- 9 Score - - - - - - 9    Assessment:    [facility] CM visited Mrs Wrenn for the second home visit again in the den of her nephew's home   [facility] CM is followed by [facility] also [facility] CM was informed she continues with weakness, .    Acute medical issues (pain of right hip to knee) States she continues with pain but had medication today to take prn   Mobility Mrs Uplinger uses a cane to get around in a slow pace she is seen by Dr Luna Glasgow  Orthopedic provider.  She reports her [facility] home [facility]) [facility] services have concluded  Chronic medical issues CHF, DM, COPD, HTN, constipation Her chronic medical issues are being maintained with medication, mobility and diet.  She is scheduled to see Dr Bari Mantis in June 2018 an follow up at the coumadin clinic. Minimal lower extremity swelling She reports use of oxygen qhs and notes minimal wheezing upon awakening but resolves during the da.  She reports chronic constipation managed with miralax  While visiting Mrs Samano she had a visit from a friend who was noted to be in respiratory distress with increased edema. Contact made with friends's pcp and she was taken to ED by her caregiver prior to CM concluding home visit with Mrs Lansdale.  Mrs Fojtik voiced appreciation for [facility] CM services   Plan:  To see her in 1-2 weeks for home visit  [facility] CM Care Plan Problem One     Most Recent Value  Care Plan Problem One  Heart Failure knowledge deficit  Role Documenting the Problem One  Care Management [place] for Problem One  Active  [facility] Long Term Goal   Patient will become familiar with heart failure disease process and be able to verbalize heart failure zones for self management wtihin 90 days.    [facility] Long Term Goal Start Date  01/15/17 [goal continued]  Interventions for Problem One Long Term Goal  RN CM reinforced CHF action plan, importance of working with home health and continuing to do exercises, energy conservation  [facility] CM Short Term Goal #1   knowledge deficit related to low sodium diet  [facility] CM Short Term Goal #1 Start Date  01/15/17  Interventions for Short Term Goal #1  RN CM reiterated foods high in sodium to limit, avoid, reviewed correlation to high sodium diet and fluid retention.  [facility] CM Short Term Goal #2   Decreased endurance  [facility] CM Short Term Goal #2 Start Date  12/25/16  [facility] CM Short Term Goal #2 Met Date  01/15/17  Interventions for Short Term Goal #2   RN CM ask pt to walk several times daily, short intervals throughout her house      [place] L. Lavina [name], RN, BSN, [place] Care Management [phone]

## 2017-02-08 NOTE — Patient Instructions (Signed)
Your physician wants you to follow-up in: 1 Year with Dr. Taylor. You will receive a reminder letter in the mail two months in advance. If you don't receive a letter, please call our office to schedule the follow-up appointment.  Your physician recommends that you continue on your current medications as directed. Please refer to the Current Medication list given to you today.  Remote monitoring is used to monitor your Pacemaker of ICD from home. This monitoring reduces the number of office visits required to check your device to one time per year. It allows us to keep an eye on the functioning of your device to ensure it is working properly. You are scheduled for a device check from home on 05/10/17. You may send your transmission at any time that day. If you have a wireless device, the transmission will be sent automatically. After your physician reviews your transmission, you will receive a postcard with your next transmission date.  If you need a refill on your cardiac medications before your next appointment, please call your pharmacy.  Thank you for choosing [facility]!   

## 2017-02-09 ENCOUNTER — Encounter: Payer: Self-pay | Admitting: Family

## 2017-02-09 ENCOUNTER — Other Ambulatory Visit: Payer: Self-pay | Admitting: *Deleted

## 2017-02-09 LAB — CUP PACEART INCLINIC DEVICE CHECK
Battery Remaining Longevity: 15 mo
Battery Voltage: 2.92 V
Brady Statistic AP VP Percent: 84.07 %
Brady Statistic AP VS Percent: 0 %
Brady Statistic AS VP Percent: 15.88 %
Brady Statistic AS VS Percent: 0.05 %
Brady Statistic RA Percent Paced: 83.23 %
Brady Statistic RV Percent Paced: 98.67 %
Date Time Interrogation Session: 20180517142848
HighPow Impedance: 37 Ohm
HighPow Impedance: 46 Ohm
Implantable Lead Implant Date: 20040521
Implantable Lead Implant Date: 20071218
Implantable Lead Implant Date: 20160516
Implantable Lead Location: 753858
Implantable Lead Location: 753859
Implantable Lead Location: 753860
Implantable Lead Model: 5076
Implantable Lead Model: 6947
Implantable Pulse Generator Implant Date: 20160516
Lead Channel Impedance Value: 304 Ohm
Lead Channel Impedance Value: 361 Ohm
Lead Channel Impedance Value: 361 Ohm
Lead Channel Impedance Value: 361 Ohm
Lead Channel Impedance Value: 361 Ohm
Lead Channel Impedance Value: 399 Ohm
Lead Channel Impedance Value: 456 Ohm
Lead Channel Impedance Value: 608 Ohm
Lead Channel Impedance Value: 646 Ohm
Lead Channel Impedance Value: 703 Ohm
Lead Channel Impedance Value: 722 Ohm
Lead Channel Impedance Value: 760 Ohm
Lead Channel Impedance Value: 760 Ohm
Lead Channel Pacing Threshold Amplitude: 0.5 V
Lead Channel Pacing Threshold Amplitude: 1 V
Lead Channel Pacing Threshold Amplitude: 1 V
Lead Channel Pacing Threshold Pulse Width: 0.4 ms
Lead Channel Pacing Threshold Pulse Width: 0.6 ms
Lead Channel Pacing Threshold Pulse Width: 1 ms
Lead Channel Sensing Intrinsic Amplitude: 1.5 mV
Lead Channel Sensing Intrinsic Amplitude: 10.5 mV
Lead Channel Sensing Intrinsic Amplitude: 10.5 mV
Lead Channel Sensing Intrinsic Amplitude: 2.375 mV
Lead Channel Setting Pacing Amplitude: 2 V
Lead Channel Setting Pacing Amplitude: 2 V
Lead Channel Setting Pacing Amplitude: 2.5 V
Lead Channel Setting Pacing Pulse Width: 0.6 ms
Lead Channel Setting Pacing Pulse Width: 1 ms
Lead Channel Setting Sensing Sensitivity: 0.3 mV

## 2017-02-12 DIAGNOSIS — H468 Other optic neuritis: Secondary | ICD-10-CM | POA: Diagnosis not present

## 2017-02-12 DIAGNOSIS — H43811 Vitreous degeneration, right eye: Secondary | ICD-10-CM | POA: Diagnosis not present

## 2017-02-12 DIAGNOSIS — H0520 Unspecified exophthalmos: Secondary | ICD-10-CM | POA: Diagnosis not present

## 2017-02-13 ENCOUNTER — Other Ambulatory Visit: Payer: Self-pay | Admitting: Licensed Clinical Social Worker

## 2017-02-13 NOTE — Patient Outreach (Signed)
Assessment:  CSW spoke via phone with client. CSW verified client identity. CSW and client spoke of client needs.Client sees Dr. [name] as primary care doctor. Client said she had her prescribed medications and is taking medications as prescribed. Client resides with her nephew who provides some support for client. Client also has some support and assistance from other family members. C;ent uses a cane to assist her with ambulation. She said that family members transport her to and from her scheduled medical appointments.  Client said she received some dental care in April of 2018. She said she is scheduled to have a medical appointment with Dr. [name] in June of 2018. Client had been receiving home health nursing support and had been receiving home health physical therapy support.  Client informed CSW on 02/13/17 that home health services for client (home health nursing and home health physical therapy) had been completed. Client said that physical therapy sessions had been helpful to client. She is walking short distances with a cane. She said she had eye appointment recently. She said she is scheduled for eye surgery in July of 2018.  She said she has appointment with eye specialist on 03/07/17 to prepare for her eye surgery in July of 2018. She said she has adequate food supply.  CSW and client spoke of client care plan.  CSW encouraged client to communicate with CSW in next 30 days to discuss community resources of assistance for client. Client is appreciative also of support she is receiving from [place]. Client takes a prescribed pain medication. Client is eating well. Client said she sometimes had decreased energy and is sleeping more. She some she sometimes sleeps more during the day than she previously had been sleeping. CSW encouraged client to call CSW at [phone] as needed to discuss social work needs of client.   Plan:  Client to communicate with CSW in the next 30 days to discuss  community resources of assistance for client.  CSW to collaborate with RN Jackelyn Poling in monitoring needs of client.  CSW to call client in 4 weeks to assess client needs at that time.    [name].Klaudia Beirne MSW, LCSW Licensed Clinical Social Worker [facility] Care Management [phone]

## 2017-02-14 ENCOUNTER — Ambulatory Visit (INDEPENDENT_AMBULATORY_CARE_PROVIDER_SITE_OTHER): Payer: Medicare Other | Admitting: *Deleted

## 2017-02-14 DIAGNOSIS — I255 Ischemic cardiomyopathy: Secondary | ICD-10-CM

## 2017-02-14 DIAGNOSIS — Z952 Presence of prosthetic heart valve: Secondary | ICD-10-CM

## 2017-02-14 DIAGNOSIS — Z5181 Encounter for therapeutic drug level monitoring: Secondary | ICD-10-CM | POA: Diagnosis not present

## 2017-02-14 LAB — POCT INR: INR: 2.8

## 2017-02-15 ENCOUNTER — Telehealth: Payer: Self-pay | Admitting: Orthopaedic Surgery

## 2017-02-15 ENCOUNTER — Other Ambulatory Visit: Payer: Self-pay | Admitting: Ophthalmology

## 2017-02-15 DIAGNOSIS — I868 Varicose veins of other specified sites: Secondary | ICD-10-CM

## 2017-02-15 MED ORDER — HYDROCODONE-ACETAMINOPHEN 7.5-325 MG PO TABS: 1.0000 | ORAL_TABLET | Freq: Four times a day (QID) | ORAL | 0 refills | Status: DC | PRN

## 2017-02-15 NOTE — Telephone Encounter (Signed)
Patient requests refill on Hydrocodone/Acegtaminophen 7.5325  Mgs.   Qty  80  Sig: Take 1 tablet by mouth every 6 (six) hours as needed for moderate pain (Must last 30 days.Do not drive or operate machinery while taking this medicine.).

## 2017-02-20 ENCOUNTER — Encounter: Payer: Self-pay | Admitting: Family

## 2017-02-20 ENCOUNTER — Ambulatory Visit (INDEPENDENT_AMBULATORY_CARE_PROVIDER_SITE_OTHER): Payer: Medicare Other | Admitting: Family

## 2017-02-20 ENCOUNTER — Ambulatory Visit (HOSPITAL_COMMUNITY)
Admission: RE | Admit: 2017-02-20 | Discharge: 2017-02-20 | Disposition: A | Discharge status: Home / Self Care | Payer: Medicare Other | Source: Ambulatory Visit | Attending: Vascular Surgery | Admitting: Vascular Surgery

## 2017-02-20 VITALS — BP 152/78 | HR 76 | Resp 16 | Ht 67.0 in | Wt 222.1 lb

## 2017-02-20 DIAGNOSIS — I714 Abdominal aortic aneurysm, without rupture, unspecified: Secondary | ICD-10-CM

## 2017-02-20 DIAGNOSIS — I255 Ischemic cardiomyopathy: Secondary | ICD-10-CM

## 2017-02-20 NOTE — Patient Instructions (Signed)
Abdominal Aortic Aneurysm Blood pumps away from the heart through tubes (blood vessels) called arteries. Aneurysms are weak or damaged places in the wall of an artery. It bulges out like a balloon. An abdominal aortic aneurysm happens in the main artery of the body (aorta). It can burst or tear, causing bleeding inside the body. This is an emergency. It needs treatment right away. What are the causes? The exact cause is unknown. Things that could cause this problem include:  Fat and other substances building up in the lining of a tube.  Swelling of the walls of a blood vessel.  Certain tissue diseases.  Belly (abdominal) trauma.  An infection in the main artery of the body. What increases the risk? There are things that make it more likely for you to have an aneurysm. These include:  Being over the age of 81 years old.  Having high blood pressure (hypertension).  Being a female.  Being white.  Being very overweight (obese).  Having a family history of aneurysm.  Using tobacco products. What are the signs or symptoms? Symptoms depend on the size of the aneurysm and how fast it grows. There may not be symptoms. If symptoms occur, they can include:  Pain (belly, side, lower back, or groin).  Feeling full after eating a small amount of food.  Feeling sick to your stomach (nauseous), throwing up (vomiting), or both.  Feeling a lump in your belly that feels like it is beating (pulsating).  Feeling like you will pass out (faint). How is this treated?  Medicine to control blood pressure and pain.  Imaging tests to see if the aneurysm gets bigger.  Surgery. How is this prevented? To lessen your chance of getting this condition:  Stop smoking. Stop chewing tobacco.  Limit or avoid alcohol.  Keep your blood pressure, blood sugar, and cholesterol within normal limits.  Eat less salt.  Eat foods low in saturated fats and cholesterol. These are found in animal and whole  dairy products.  Eat more fiber. Fiber is found in whole grains, vegetables, and fruits.  Keep a healthy weight.  Stay active and exercise often. This information is not intended to replace advice given to you by your health care provider. Make sure you discuss any questions you have with your health care provider. Document Released: 01/06/2013 Document Revised: 02/17/2016 Document Reviewed: 10/11/2012 Elsevier Interactive Patient Education  2017 Elsevier Inc.     Before your next abdominal ultrasound:  Take two Extra-Strength Gas-X capsules at bedtime the night before the test. Take another two Extra-Strength Gas-X capsules 3 hours before the test.  Avoid gas forming foods the day before the test.     

## 2017-02-20 NOTE — Progress Notes (Signed)
[facility] [place]   CC: Follow up Abdominal Aortic Aneurysm  History of Present Illness  [name] [name] is a 83 y.o. (03/30/1932) female who is known to Dr. Donnetta Hutching from prior evaluation of lower extremity vascular pathology in 2010. At that time he recommended observation only and she is done quite well since that time.  She does complain of right leg pain and spinal stenosis. Had a CT of her lumbar lumbar spine in March 2017. This showed a 4.0 cm infrarenal abdominal aortic aneurysm with a slight increase in size since her prior CT in 2013. The patient had no prior knowledge of this. No family history of aneurysm. She does have significant cardiac disease but no history of peripheral vascular occlusive disease.  She denies abdominal pain other than suprapubic, that seem to resolve after she eats; I advised her to discuss this with her PCP, denies any new back pain.   Dr. Donnetta Hutching last evaluated pt on 01-18-16. At that time he indicated that she has minimal risk of rupture at the current size of her AAA; he did explain long-term follow-up with the natural history of slow progressive growth. Dr. Donnetta Hutching expects that at her advanced age that it will be unlikely that she would require repair; he recommended that we see her again in one year with ultrasound to rule out expansion. Dr. Donnetta Hutching did discuss the symptoms of leaking aneurysm she knows to seek emergency medical care should this occur.  Pt states she was told that she needs a right knee replacement, but surgeon is reluctant due to her being on coumadin, her cardiac history, and her advanced age.  Her walking is limited by this, uses a cane.  She denies any current chest pain or dyspnea.  The patient does not seem to have claudication in legs with walking. The patient denies any history of stroke or TIA symptoms.  She is taking coumadin since her aortic valve replacement. She also takes a statin.  Pt Diabetic: No, states she  was diabetic until about 2014, is diet controlled now Pt smoker: former smoker, quit about 1988, smoked about 40 years   Past Medical History:  Diagnosis Date  . Adenomatous polyp of colon 12/16/2002   Dr. [name]/[facility]  . Allergy   . Cataract   . CHB (complete heart block) ([place]) 10/07/2014      . CHF (congestive heart failure) ([place])       . Chronic kidney disease, stage I 2006  . Constipation       . COPD (chronic obstructive pulmonary disease) ([place])       . DJD (degenerative joint disease) of lumbar spine   . DM (diabetes mellitus) ([place]) 2006  . Esophageal reflux   . Glaucoma   . Gout       . Heart murmur   . Hyperlipemia   . IDA (iron deficiency anemia)    Parenteral iron/[name]  . Insomnia       . Non-ischemic cardiomyopathy ([place])    a. s/p MDT CRTD  . Obesity, unspecified   . Other and unspecified hyperlipidemia   . Oxygen deficiency 2014   nocturnal;  . Spondylolisthesis   . Spondylosis   . Thyroid cancer ([place])    remote thyroidectomy, no recurrence, pt denies in 2016 thyroid cancer  . Unspecified hypothyroidism       . 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 Vertigo  Past Surgical History:  Procedure Laterality Date  . AORTIC AND MITRAL VALVE REPLACEMENT  2001  . BI-VENTRICULAR IMPLANTABLE CARDIOVERTER DEFIBRILLATOR UPGRADE N/A 01/18/2015   Procedure: BI-VENTRICULAR IMPLANTABLE CARDIOVERTER DEFIBRILLATOR UPGRADE;  Surgeon: Evans Lance, MD;  Location: [facility] CATH LAB;  Service: Cardiovascular;  Laterality: N/A;  . CARDIAC CATHETERIZATION    . CARDIAC VALVE REPLACEMENT    . CARDIOVERSION N/A 11/13/2016   Procedure: CARDIOVERSION;  Surgeon: Sanda Klein, MD;  Location: [facility] ENDOSCOPY;  Service: Cardiovascular;  Laterality: N/A;  . COLONOSCOPY  06/12/2007   Rourk- Normal rectum/Normal colon  . COLONOSCOPY  12/16/02   small hemorrhoids  . COLONOSCOPY  06/20/2012   Procedure: COLONOSCOPY;   Surgeon: Daneil Dolin, MD;  Location: [facility] ENDO SUITE;  Service: Endoscopy;  Laterality: N/A;  10:45  . defibrillator implanted 2006    . DOPPLER ECHOCARDIOGRAPHY  2012  . DOPPLER ECHOCARDIOGRAPHY  05,06,07,08,09,2011  . ESOPHAGOGASTRODUODENOSCOPY  06/12/2007   Rourk- normal esophagus, small hiatal hernia, otherwise normal stomach, D1, D2  . EYE SURGERY Right 2005  . EYE SURGERY Left 2006  . ICD LEAD REMOVAL Left 02/08/2015   Procedure: ICD LEAD REMOVAL;  Surgeon: Evans Lance, MD;  Location: [facility] OR;  Service: Cardiovascular;  Laterality: Left;  "Will plan extraction and insertion of a BiV PM"  **Dr. Roxan Hockey backing up case**  . IMPLANTABLE CARDIOVERTER DEFIBRILLATOR (ICD) GENERATOR CHANGE Left 02/08/2015   Procedure: ICD GENERATOR CHANGE;  Surgeon: Evans Lance, MD;  Location: [facility];  Service: Cardiovascular;  Laterality: Left;  . INSERT / REPLACE / REMOVE PACEMAKER    . PACEMAKER INSERTION  May 2016  . pacemaker placed  2004  . right breast cyst removed     benign   . right cataract removed     2005  . TEE WITHOUT CARDIOVERSION N/A 11/13/2016   Procedure: TRANSESOPHAGEAL ECHOCARDIOGRAM (TEE);  Surgeon: Sanda Klein, MD;  Location: [facility] ENDOSCOPY;  Service: Cardiovascular;  Laterality: N/A;  . THYROIDECTOMY     Social History Social History   Social History  . Marital status: Widowed    Spouse name: N/A  . Number of children: 0  . Years of education: N/A   Occupational History  . retired Retired   Social History Main Topics  . Smoking status: Former Smoker    Packs/day: 0.75    Years: 40.00    Types: Cigarettes    Quit date: 03/08/1987  . Smokeless tobacco: Never Used  . Alcohol use No  . Drug use: No  . Sexual activity: Not Currently   Other Topics Concern  . Not on file   Social History Narrative   From [place] (2004)   Family History Family History  Problem Relation Age of Onset  . Cancer Mother        behind pancres  . Heart disease Father   .  Heart disease Sister        Heart attack  . Heart disease Brother   . Diabetes Brother   . Heart disease Brother   . Diabetes Brother   . Hypertension Brother   . Cancer Brother        lung    Current Outpatient Prescriptions on File Prior to Visit  Medication Sig Dispense Refill  . acetaminophen (TYLENOL) 500 MG tablet Take 500 mg by mouth every 6 (six) hours as needed (pain).    Marland Kitchen albuterol (PROVENTIL) (2.5 MG/3ML) 0.083% nebulizer solution INHALE 1 VIAL VIA NEBULIZER THREE TIMES DAILY. 90 vial 5  .  albuterol (VENTOLIN HFA) 108 (90 Base) MCG/ACT inhaler Inhale 2 puffs into the lungs every 6 (six) hours as needed for wheezing or shortness of breath. 3 Inhaler 3  . allopurinol (ZYLOPRIM) 100 MG tablet Take 1 tablet (100 mg total) by mouth daily. 90 tablet 1  . azelastine (ASTELIN) 0.1 % nasal spray Place 2 sprays into both nostrils 2 (two) times daily. Use in each nostril as directed 30 mL 12  . cholecalciferol (VITAMIN D) 1000 UNITS tablet Take 1,000 Units by mouth daily.    Marland Kitchen COUMADIN 5 MG tablet Take 2 tablets daily (Patient taking differently: Take 10 mg by mouth every evening. Take 5mg  on Wednesdays and Saturdays and take 10mg  on all other days) 10 tablet 1  . digoxin (LANOXIN) 0.125 MG tablet Take 1 tablet (0.125 mg total) by mouth every other day. 90 tablet 3  . docusate sodium (COLACE) 100 MG capsule Take 300 mg by mouth at bedtime.    . fluticasone (FLONASE) 50 MCG/ACT nasal spray USE 2 SPRAYS IN EACH       NOSTRIL ONCE DAILY (Patient taking differently: USE 2 SPRAYS IN EACH       NOSTRIL ONCE DAILY AS NEEDED FOR CONGESTION) 48 g 1  . folic acid (FOLVITE) 1 MG tablet Take 1 tablet (1 mg total) by mouth daily. 100 tablet 3  . furosemide (LASIX) 40 MG tablet Take 1 tablet by mouth  daily 90 tablet 1  . gabapentin (NEURONTIN) 300 MG capsule Take 1 capsule (300 mg total) by mouth 2 (two) times daily. 180 capsule 1  . hydrALAZINE (APRESOLINE) 25 MG tablet Take 1 tablet (25 mg total) by  mouth 3 (three) times daily. 270 tablet 3  . HYDROcodone-acetaminophen (NORCO) 7.5-325 MG tablet Take 1 tablet by mouth every 6 (six) hours as needed for moderate pain (Must last 30 days.Do not drive or operate machinery while taking this medicine.). 70 tablet 0  . isosorbide mononitrate (IMDUR) 60 MG 24 hr tablet take 1 tablet by mouth once daily 90 tablet 1  . losartan (COZAAR) 50 MG tablet take 1 tablet by mouth once daily 90 tablet 1  . metolazone (ZAROXOLYN) 2.5 MG tablet Take 1 tablet (2.5 mg total) by mouth once a week. 12 tablet 2  . ondansetron (ZOFRAN) 4 MG tablet Take 1 tablet (4 mg total) by mouth daily as needed for nausea or vomiting. 90 tablet 0  . Polyethyl [name] (SYSTANE OP) Place 1 drop into both eyes daily as needed (dry eyes).    . potassium chloride SA (KLOR-CON M20) 20 MEQ tablet Take 1 tablet by mouth  daily (Patient taking differently: Take 20 mEq by mouth daily. Take 1 tablet by mouth  daily) 30 tablet 0  . pravastatin (PRAVACHOL) 20 MG tablet Take 1 tablet (20 mg total) by mouth daily. 90 tablet 1  . promethazine-dextromethorphan (PROMETHAZINE-DM) 6.25-15 MG/5ML syrup One teaspoon at bedtime, as needed, for excessive cough (Patient taking differently: Take 5 mLs by mouth at bedtime as needed for cough. as needed, for excessive cough) 180 mL 0  . SYNTHROID 75 MCG tablet Take 1 tablet by mouth  daily 90 tablet 1  . TOPROL XL 100 MG 24 hr tablet Take 1 tablet (100 mg total) by mouth daily. Take with or immediately following a meal. 90 tablet 1   Current Facility-Administered Medications on File Prior to Visit  Medication Dose Route Frequency Provider Last Rate Last Dose  . epoetin alfa (EPOGEN,PROCRIT) injection 40,000 Units  40,000  Units Subcutaneous Once Farrel Gobble, MD        ROS: See HPI for pertinent positives and negatives.  Physical Examination  Vitals:   02/20/17 0929  BP: (!) 152/78  Pulse: 76  Resp: 16  SpO2: 94%  Weight: 222 lb 2 oz  (100.8 kg)  Height: 5\' 7"  (1.702 m)   Body mass index is 34.79 kg/m.  General: A&O x 3, WD, obese female in NAD. She ambulates with a cane.  Pulmonary: Sym exp, respirations are non labored, good air movt, CTAB, no rales, rhonchi, or wheezing.  Cardiac: RRR, Nl S1, S2, no detected murmur, prosthetic valve click heard. Pacemaker palpated left upper chest.    Carotid Bruits Right Left   Negative Negative   Aorta is not palpable Radial pulses are 2+ palpable bilaterally                          VASCULAR EXAM:                                                                                                         LE Pulses Right Left       POPLITEAL  not palpable   not palpable       POSTERIOR TIBIAL  not palpable   not+ palpable        DORSALIS PEDIS      ANTERIOR TIBIAL 2+ palpable  2+ palpable      Gastrointestinal: soft, NTND, -G/R, - HSM, - masses palpated, - CVAT B.  Musculoskeletal: M/S 5/5 in upper extremities, 4/5 in lower extremities, Extremities without ischemic changes.  Neurologic: CN 2-12 intact, Pain and light touch intact in extremities are intact, Motor exam as listed above.   CTA chest on 11-16-16 to evaluate dyspnea, CHF: Adequate contrast opacification of the thoracic aorta with no evidence of dissection, aneurysm, or stenosis. There is dilated ascending aorta up to 4.2 cm diameter. Classic 3 vessel Brachiocephalic arch anatomy without proximal stenosis  Non-Invasive Vascular Imaging  AAA Duplex (02/20/2017)  Previous size: 4.0 cm by CT (Date: 12-03-15)  Current size: 4.08 cm (Date: 02-20-17), bilateral common iliac arteries were not visualized. Technically difficult exam due to patient body habitus and overlying bowel gas.  Medical Decision Making  The patient is a 83 y.o. female who presents with asymptomatic AAA with no increase in size, based on limited visualiztion. I discussed with Dr. Donnetta Hutching the 4.2 cm ascending aorta dilation found on CTA  chest in February 2018. He advised that pt follow up with her PCP re following this.   Her serum creatinine on 11-29-16 was 1.41, 1.8 on 11-13-16 (review of records)   Based on this patient's exam and diagnostic studies, the patient will follow up in 1 year  with the following studies: Abdominal Aortic Aneurysm duplex.  Consideration for repair of AAA would be made when the size is 5.0 cm, growth > 1 cm/yr, and symptomatic status.  I emphasized the importance of maximal medical management including strict control of blood pressure, blood glucose,  and lipid levels, antiplatelet agents, obtaining regular exercise, and continued cessation of smoking.   The patient was given information about AAA including signs, symptoms, treatment, and how to minimize the risk of enlargement and rupture of aneurysms.    The patient was advised to call 911 should the patient experience sudden onset abdominal or back pain.   Thank you for allowing [place] to participate in this patient's care.  Clemon Chambers, RN, MSN, FNP-C [facility] [place] Office: [phone]  Clinic Physician: Early  02/20/2017, 1:44 PM

## 2017-02-21 ENCOUNTER — Encounter (HOSPITAL_BASED_OUTPATIENT_CLINIC_OR_DEPARTMENT_OTHER): Payer: Medicare Other | Admitting: Adult Health

## 2017-02-21 ENCOUNTER — Encounter (HOSPITAL_COMMUNITY): Payer: Medicare Other

## 2017-02-21 ENCOUNTER — Other Ambulatory Visit (HOSPITAL_COMMUNITY)
Admission: RE | Admit: 2017-02-21 | Discharge: 2017-02-21 | Disposition: A | Discharge status: Home / Self Care | Payer: Medicare Other | Source: Ambulatory Visit | Attending: Family Medicine | Admitting: Family Medicine

## 2017-02-21 ENCOUNTER — Encounter (HOSPITAL_BASED_OUTPATIENT_CLINIC_OR_DEPARTMENT_OTHER): Payer: Medicare Other

## 2017-02-21 ENCOUNTER — Telehealth: Payer: Self-pay

## 2017-02-21 ENCOUNTER — Ambulatory Visit (HOSPITAL_COMMUNITY): Payer: Medicare Other | Admitting: Oncology

## 2017-02-21 ENCOUNTER — Encounter (HOSPITAL_COMMUNITY): Payer: Self-pay | Admitting: Adult Health

## 2017-02-21 ENCOUNTER — Other Ambulatory Visit (HOSPITAL_COMMUNITY)
Admission: RE | Admit: 2017-02-21 | Discharge: 2017-02-21 | Disposition: A | Discharge status: Home / Self Care | Payer: Medicare Other | Source: Ambulatory Visit | Attending: Ophthalmology | Admitting: Ophthalmology

## 2017-02-21 VITALS — BP 114/70 | HR 80 | Resp 16 | Ht 67.0 in | Wt 222.0 lb

## 2017-02-21 DIAGNOSIS — D649 Anemia, unspecified: Secondary | ICD-10-CM

## 2017-02-21 DIAGNOSIS — E559 Vitamin D deficiency, unspecified: Secondary | ICD-10-CM | POA: Insufficient documentation

## 2017-02-21 DIAGNOSIS — M8580 Other specified disorders of bone density and structure, unspecified site: Secondary | ICD-10-CM | POA: Insufficient documentation

## 2017-02-21 DIAGNOSIS — I10 Essential (primary) hypertension: Secondary | ICD-10-CM | POA: Insufficient documentation

## 2017-02-21 DIAGNOSIS — G479 Sleep disorder, unspecified: Secondary | ICD-10-CM | POA: Insufficient documentation

## 2017-02-21 DIAGNOSIS — M25561 Pain in right knee: Secondary | ICD-10-CM | POA: Insufficient documentation

## 2017-02-21 DIAGNOSIS — E1122 Type 2 diabetes mellitus with diabetic chronic kidney disease: Secondary | ICD-10-CM | POA: Diagnosis not present

## 2017-02-21 DIAGNOSIS — D631 Anemia in chronic kidney disease: Secondary | ICD-10-CM | POA: Insufficient documentation

## 2017-02-21 DIAGNOSIS — N184 Chronic kidney disease, stage 4 (severe): Secondary | ICD-10-CM

## 2017-02-21 DIAGNOSIS — E611 Iron deficiency: Secondary | ICD-10-CM

## 2017-02-21 DIAGNOSIS — R5383 Other fatigue: Secondary | ICD-10-CM | POA: Diagnosis not present

## 2017-02-21 DIAGNOSIS — E890 Postprocedural hypothyroidism: Secondary | ICD-10-CM | POA: Diagnosis not present

## 2017-02-21 DIAGNOSIS — M79604 Pain in right leg: Secondary | ICD-10-CM | POA: Insufficient documentation

## 2017-02-21 DIAGNOSIS — E785 Hyperlipidemia, unspecified: Secondary | ICD-10-CM | POA: Insufficient documentation

## 2017-02-21 DIAGNOSIS — I442 Atrioventricular block, complete: Secondary | ICD-10-CM | POA: Diagnosis not present

## 2017-02-21 DIAGNOSIS — G8929 Other chronic pain: Secondary | ICD-10-CM | POA: Diagnosis not present

## 2017-02-21 DIAGNOSIS — D509 Iron deficiency anemia, unspecified: Secondary | ICD-10-CM | POA: Diagnosis not present

## 2017-02-21 LAB — TSH
TSH: 1.845 u[IU]/mL (ref 0.350–4.500)
TSH: 2.018 u[IU]/mL (ref 0.350–4.500)

## 2017-02-21 LAB — CBC WITH DIFFERENTIAL/PLATELET
Basophils Absolute: 0 10*3/uL (ref 0.0–0.1)
Basophils Relative: 0 %
Eosinophils Absolute: 0 10*3/uL (ref 0.0–0.7)
Eosinophils Relative: 0 %
HCT: 33.5 % — ABNORMAL LOW (ref 36.0–46.0)
Hemoglobin: 10.7 g/dL — ABNORMAL LOW (ref 12.0–15.0)
Lymphocytes Relative: 15 %
Lymphs Abs: 0.9 10*3/uL (ref 0.7–4.0)
MCH: 30.5 pg (ref 26.0–34.0)
MCHC: 31.9 g/dL (ref 30.0–36.0)
MCV: 95.4 fL (ref 78.0–100.0)
Monocytes Absolute: 0.4 10*3/uL (ref 0.1–1.0)
Monocytes Relative: 7 %
Neutro Abs: 4.9 10*3/uL (ref 1.7–7.7)
Neutrophils Relative %: 78 %
Platelets: 191 10*3/uL (ref 150–400)
RBC: 3.51 MIL/uL — ABNORMAL LOW (ref 3.87–5.11)
RDW: 16.2 % — ABNORMAL HIGH (ref 11.5–15.5)
WBC: 6.2 10*3/uL (ref 4.0–10.5)

## 2017-02-21 LAB — COMPREHENSIVE METABOLIC PANEL
ALT: 13 U/L — ABNORMAL LOW (ref 14–54)
AST: 24 U/L (ref 15–41)
Albumin: 4.3 g/dL (ref 3.5–5.0)
Alkaline Phosphatase: 49 U/L (ref 38–126)
Anion gap: 9 (ref 5–15)
BUN: 57 mg/dL — ABNORMAL HIGH (ref 6–20)
CO2: 28 mmol/L (ref 22–32)
Calcium: 9.8 mg/dL (ref 8.9–10.3)
Chloride: 102 mmol/L (ref 101–111)
Creatinine, Ser: 1.62 mg/dL — ABNORMAL HIGH (ref 0.44–1.00)
GFR calc Af Amer: 33 mL/min — ABNORMAL LOW (ref >60)
GFR calc non Af Amer: 28 mL/min — ABNORMAL LOW (ref >60)
Glucose, Bld: 98 mg/dL (ref 65–99)
Potassium: 4 mmol/L (ref 3.5–5.1)
Sodium: 139 mmol/L (ref 135–145)
Total Bilirubin: 0.5 mg/dL (ref 0.3–1.2)
Total Protein: 8.6 g/dL — ABNORMAL HIGH (ref 6.5–8.1)

## 2017-02-21 LAB — LIPID PANEL
Cholesterol: 178 mg/dL (ref 0–200)
HDL: 30 mg/dL — ABNORMAL LOW (ref >40)
LDL Cholesterol: 96 mg/dL (ref 0–99)
Total CHOL/HDL Ratio: 5.9 RATIO
Triglycerides: 261 mg/dL — ABNORMAL HIGH (ref <150)
VLDL: 52 mg/dL — ABNORMAL HIGH (ref 0–40)

## 2017-02-21 LAB — BASIC METABOLIC PANEL
Anion gap: 10 (ref 5–15)
BUN: 58 mg/dL — ABNORMAL HIGH (ref 6–20)
CO2: 27 mmol/L (ref 22–32)
Calcium: 9.9 mg/dL (ref 8.9–10.3)
Chloride: 102 mmol/L (ref 101–111)
Creatinine, Ser: 1.65 mg/dL — ABNORMAL HIGH (ref 0.44–1.00)
GFR calc Af Amer: 32 mL/min — ABNORMAL LOW (ref >60)
GFR calc non Af Amer: 28 mL/min — ABNORMAL LOW (ref >60)
Glucose, Bld: 104 mg/dL — ABNORMAL HIGH (ref 65–99)
Potassium: 4.3 mmol/L (ref 3.5–5.1)
Sodium: 139 mmol/L (ref 135–145)

## 2017-02-21 LAB — IRON AND TIBC
Iron: 65 ug/dL (ref 28–170)
Saturation Ratios: 22 % (ref 10.4–31.8)
TIBC: 293 ug/dL (ref 250–450)
UIBC: 228 ug/dL

## 2017-02-21 LAB — FERRITIN: Ferritin: 940 ng/mL — ABNORMAL HIGH (ref 11–307)

## 2017-02-21 MED ORDER — EPOETIN ALFA 40000 UNIT/ML IJ SOLN
40000.0000 [IU] | Freq: Once | INTRAMUSCULAR | Status: AC
  Administered 2017-02-21: 40000 [IU] via SUBCUTANEOUS
  Filled 2017-02-21: qty 1

## 2017-02-21 NOTE — Progress Notes (Signed)
[place], [place] [address]   CLINIC:  Medical Oncology/Hematology  PCP:  Fayrene Helper, MD [address], [address] [place] [address] [phone]   REASON FOR VISIT:  Follow-up for chronic anemia   CURRENT THERAPY: Procrit 40,000 units SQ every 3 weeks    HISTORY OF PRESENT ILLNESS:  (From Kirby Crigler, PA-C's last note on 11/29/16)     INTERVAL HISTORY:  Ms. Nay 83 y.o. female returns for follow-up for anemia secondary to CKD.   Overall, she tells me she has been "doing okay." Endorses that she has felt tired, "it feels like my energy got up and left." Endorses energy levels of 50%. Endorses that her appetite is generally good at 75%. She continues to struggle with chronic pain to her right knee and occasionally to her left arm. She denies any lower extremity edema, skin erythema, or calf pain. Her pain is managed by Dr. Luna Glasgow; she takes Norco as needed.    Denies any frank bleeding episodes including blood in her stools, dark/tarry stools, hematuria, vaginal bleeding, nosebleeds, or gingival bleeding. Endorses occasional abdominal pain, particularly before having a bowel movement; she thinks this is secondary to gas. Endorses nausea at times, particularly with certain foods that she eats. Denies any vomiting. She has occasional dizziness, "but not too often." She doesn't sleep well at night, but she attributes this to taking naps during the day.  She is concerned about the cost of her Procrit injections. She is not sure if she has ever talked with her met with our patient advocate, Angie. I let Ms. Beachem know that I will touch base with Angie and have her give the patient call to discuss any potential financial resources that may be available for her.  Otherwise, she is largely without complaints today.   REVIEW OF SYSTEMS:  Review of Systems  Constitutional: Positive for fatigue.  HENT:  Negative.  Negative for  nosebleeds.   Eyes: Negative.   Respiratory: Negative.        She wears oxygen at bedtime; this has been chronic and is unchanged.  Cardiovascular: Negative.  Negative for leg swelling.  Gastrointestinal: Positive for abdominal pain and nausea. Negative for blood in stool, constipation, diarrhea and vomiting.  Endocrine: Negative.   Genitourinary: Negative.  Negative for dysuria, hematuria and vaginal bleeding.   Musculoskeletal: Positive for arthralgias.  Skin: Negative.   Neurological: Positive for dizziness. Negative for headaches.  Hematological: Negative.   Psychiatric/Behavioral: Positive for sleep disturbance.     PAST MEDICAL/SURGICAL HISTORY:  Past Medical History:  Diagnosis Date  . Adenomatous polyp of colon 12/16/2002   Dr. [name]/[facility]  . Allergy   . Cataract   . CHB (complete heart block) ([place]) 10/07/2014      . CHF (congestive heart failure) ([place])       . Chronic kidney disease, stage I 2006  . Constipation       . COPD (chronic obstructive pulmonary disease) ([place])       . DJD (degenerative joint disease) of lumbar spine   . DM (diabetes mellitus) ([place]) 2006  . Esophageal reflux   . Glaucoma   . Gout       . Heart murmur   . Hyperlipemia   . IDA (iron deficiency anemia)    Parenteral iron/[name]  . Insomnia       . Non-ischemic cardiomyopathy ([place])    a. s/p MDT CRTD  .  Obesity, unspecified   . Other and unspecified hyperlipidemia   . Oxygen deficiency 2014   nocturnal;  . Spondylolisthesis   . Spondylosis   . Thyroid cancer ([place])    remote thyroidectomy, no recurrence, pt denies in 2016 thyroid cancer  . Unspecified hypothyroidism       . 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 Vertigo        Past Surgical History:  Procedure Laterality Date  . AORTIC AND MITRAL VALVE REPLACEMENT  2001  . BI-VENTRICULAR IMPLANTABLE CARDIOVERTER DEFIBRILLATOR UPGRADE N/A 01/18/2015    Procedure: BI-VENTRICULAR IMPLANTABLE CARDIOVERTER DEFIBRILLATOR UPGRADE;  Surgeon: Evans Lance, MD;  Location[facility] CATH LAB;  Service: Cardiovascular;  Laterality: N/A;  . CARDIAC CATHETERIZATION    . CARDIAC VALVE REPLACEMENT    . CARDIOVERSION N/A 11/13/2016   Procedure: CARDIOVERSION;  Surgeon: Sanda Klein, MD;  Location: [facility] ENDOSCOPY;  Service: Cardiovascular;  Laterality: N/A;  . COLONOSCOPY  06/12/2007   Rourk- Normal rectum/Normal colon  . COLONOSCOPY  12/16/02   small hemorrhoids  . COLONOSCOPY  06/20/2012   Procedure: COLONOSCOPY;  Surgeon: Daneil Dolin, MD;  Location: [facility] ENDO SUITE;  Service: Endoscopy;  Laterality: N/A;  10:45  . defibrillator implanted 2006    . DOPPLER ECHOCARDIOGRAPHY  2012  . DOPPLER ECHOCARDIOGRAPHY  05,06,07,08,09,2011  . ESOPHAGOGASTRODUODENOSCOPY  06/12/2007   Rourk- normal esophagus, small hiatal hernia, otherwise normal stomach, D1, D2  . EYE SURGERY Right 2005  . EYE SURGERY Left 2006  . ICD LEAD REMOVAL Left 02/08/2015   Procedure: ICD LEAD REMOVAL;  Surgeon: Evans Lance, MD;  Location: [facility] OR;  Service: Cardiovascular;  Laterality: Left;  "Will plan extraction and insertion of a BiV PM"  **Dr. Roxan Hockey backing up case**  . IMPLANTABLE CARDIOVERTER DEFIBRILLATOR (ICD) GENERATOR CHANGE Left 02/08/2015   Procedure: ICD GENERATOR CHANGE;  Surgeon: Evans Lance, MD;  Location: [facility];  Service: Cardiovascular;  Laterality: Left;  . INSERT / REPLACE / REMOVE PACEMAKER    . PACEMAKER INSERTION  May 2016  . pacemaker placed  2004  . right breast cyst removed     benign   . right cataract removed     2005  . TEE WITHOUT CARDIOVERSION N/A 11/13/2016   Procedure: TRANSESOPHAGEAL ECHOCARDIOGRAM (TEE);  Surgeon: Sanda Klein, MD;  Location: [facility] ENDOSCOPY;  Service: Cardiovascular;  Laterality: N/A;  . THYROIDECTOMY       SOCIAL HISTORY:  Social History   Social History  . Marital status: Widowed    Spouse name: N/A  . Number of children:  0  . Years of education: N/A   Occupational History  . retired Retired   Social History Main Topics  . Smoking status: Former Smoker    Packs/day: 0.75    Years: 40.00    Types: Cigarettes    Quit date: 03/08/1987  . Smokeless tobacco: Never Used  . Alcohol use No  . Drug use: No  . Sexual activity: Not Currently   Other Topics Concern  . Not on file   Social History Narrative   From [place] (2004)    FAMILY HISTORY:  Family History  Problem Relation Age of Onset  . Cancer Mother        behind pancres  . Heart disease Father   . Heart disease Sister        Heart attack  . Heart disease Brother   . Diabetes Brother   . Heart disease Brother   .  Diabetes Brother   . Hypertension Brother   . Cancer Brother        lung    CURRENT MEDICATIONS:  Outpatient Encounter Prescriptions as of 02/21/2017  Medication Sig Note  . acetaminophen (TYLENOL) 500 MG tablet Take 500 mg by mouth every 6 (six) hours as needed (pain).   Marland Kitchen albuterol (PROVENTIL) (2.5 MG/3ML) 0.083% nebulizer solution INHALE 1 VIAL VIA NEBULIZER THREE TIMES DAILY.   Marland Kitchen albuterol (VENTOLIN HFA) 108 (90 Base) MCG/ACT inhaler Inhale 2 puffs into the lungs every 6 (six) hours as needed for wheezing or shortness of breath.   . allopurinol (ZYLOPRIM) 100 MG tablet Take 1 tablet (100 mg total) by mouth daily.   Marland Kitchen azelastine (ASTELIN) 0.1 % nasal spray Place 2 sprays into both nostrils 2 (two) times daily. Use in each nostril as directed 01/23/2017: uses as needed  . calcitRIOL (ROCALTROL) 0.25 MCG capsule    . cholecalciferol (VITAMIN D) 1000 UNITS tablet Take 1,000 Units by mouth daily.   Marland Kitchen COUMADIN 5 MG tablet Take 2 tablets daily (Patient taking differently: Take 10 mg by mouth every evening. Take 27m on Wednesdays and Saturdays and take 15mon all other days) 10/31/2016: BRAND ONLY  . digoxin (LANOXIN) 0.125 MG tablet Take 1 tablet (0.125 mg total) by mouth every other day. 10/31/2016: BRAND ONLY  . docusate sodium  (COLACE) 100 MG capsule Take 300 mg by mouth at bedtime.   . fluticasone (FLONASE) 50 MCG/ACT nasal spray USE 2 SPRAYS IN EACH       NOSTRIL ONCE DAILY (Patient taking differently: USE 2 SPRAYS IN EACH       NOSTRIL ONCE DAILY AS NEEDED FOR CONGESTION)   . folic acid (FOLVITE) 1 MG tablet Take 1 tablet (1 mg total) by mouth daily.   . furosemide (LASIX) 40 MG tablet Take 1 tablet by mouth  daily   . gabapentin (NEURONTIN) 300 MG capsule Take 1 capsule (300 mg total) by mouth 2 (two) times daily.   . hydrALAZINE (APRESOLINE) 25 MG tablet Take 1 tablet (25 mg total) by mouth 3 (three) times daily.   . Marland KitchenYDROcodone-acetaminophen (NORCO) 7.5-325 MG tablet Take 1 tablet by mouth every 6 (six) hours as needed for moderate pain (Must last 30 days.Do not drive or operate machinery while taking this medicine.).   . Marland Kitchensosorbide mononitrate (IMDUR) 60 MG 24 hr tablet take 1 tablet by mouth once daily   . losartan (COZAAR) 50 MG tablet take 1 tablet by mouth once daily   . metolazone (ZAROXOLYN) 2.5 MG tablet Take 1 tablet (2.5 mg total) by mouth once a week. 01/23/2017: Get in mail order use only on wednesdays   . ondansetron (ZOFRAN) 4 MG tablet Take 1 tablet (4 mg total) by mouth daily as needed for nausea or vomiting.   . Vladimir Fasterlycol-Propyl Glycol (SYSTANE OP) Place 1 drop into both eyes daily as needed (dry eyes).   . potassium chloride SA (KLOR-CON M20) 20 MEQ tablet Take 1 tablet by mouth  daily (Patient taking differently: Take 20 mEq by mouth daily. Take 1 tablet by mouth  daily)   . pravastatin (PRAVACHOL) 20 MG tablet Take 1 tablet (20 mg total) by mouth daily.   . promethazine-dextromethorphan (PROMETHAZINE-DM) 6.25-15 MG/5ML syrup One teaspoon at bedtime, as needed, for excessive cough (Patient taking differently: Take 5 mLs by mouth at bedtime as needed for cough. as needed, for excessive cough)   . SYNTHROID 75 MCG tablet Take 1 tablet by mouth  daily   . TOPROL XL 100 MG 24 hr tablet Take 1  tablet (100 mg total) by mouth daily. Take with or immediately following a meal.    Facility-Administered Encounter Medications as of 02/21/2017  Medication  . epoetin alfa (EPOGEN,PROCRIT) injection 40,000 Units  . [COMPLETED] epoetin alfa (EPOGEN,PROCRIT) injection 40,000 Units    ALLERGIES:  Allergies  Allergen Reactions  . Penicillins Other (See Comments)  . Aspirin Other (See Comments)    On coumadin  . Fentanyl Dermatitis    Rash with patch   . Niacin Itching     PHYSICAL EXAM:  ECOG Performance status: 2 - Symptomatic; requires occasional assistance   Vitals:   02/21/17 1055  BP: 114/70  Pulse: 80  Resp: 16   Filed Weights   02/21/17 1055  Weight: 222 lb (100.7 kg)    Physical Exam  Constitutional: She is oriented to person, place, and time and well-developed, well-nourished, and in no distress.  -Exam done with patient seated in wheelchair   HENT:  Head: Normocephalic.  Mouth/Throat: Oropharynx is clear and moist. No oropharyngeal exudate.  Eyes: Conjunctivae are normal. Pupils are equal, round, and reactive to light. No scleral icterus.  Neck: Normal range of motion. Neck supple.  Cardiovascular: Normal rate.   Systolic click  Pulmonary/Chest: Effort normal. No respiratory distress. She has no wheezes.  Mild rhonchi noted to bilateral bases   Abdominal: Soft. Bowel sounds are normal. There is no tenderness.  Musculoskeletal: Normal range of motion. She exhibits edema (Mild right knee edema ).  Lymphadenopathy:    She has no cervical adenopathy.       Right: No supraclavicular adenopathy present.       Left: No supraclavicular adenopathy present.  Neurological: She is alert and oriented to person, place, and time. No cranial nerve deficit. Gait normal.  Skin: Skin is warm and dry. No rash noted.  Psychiatric: Mood, memory, affect and judgment normal.  Nursing note and vitals reviewed.    LABORATORY DATA:  I have reviewed the labs as listed.  CBC     Component Value Date/Time   WBC 6.2 02/21/2017 1008   RBC 3.51 (L) 02/21/2017 1008   HGB 10.7 (L) 02/21/2017 1008   HCT 33.5 (L) 02/21/2017 1008   PLT 191 02/21/2017 1008   MCV 95.4 02/21/2017 1008   MCH 30.5 02/21/2017 1008   MCHC 31.9 02/21/2017 1008   RDW 16.2 (H) 02/21/2017 1008   LYMPHSABS 0.9 02/21/2017 1008   MONOABS 0.4 02/21/2017 1008   EOSABS 0.0 02/21/2017 1008   BASOSABS 0.0 02/21/2017 1008   CMP Latest Ref Rng & Units 02/21/2017 02/21/2017 11/29/2016  Glucose 65 - 99 mg/dL 98 104(H) 100(H)  BUN 6 - 20 mg/dL 57(H) 58(H) 38(H)  Creatinine 0.44 - 1.00 mg/dL 1.62(H) 1.65(H) 1.41(H)  Sodium 135 - 145 mmol/L 139 139 136  Potassium 3.5 - 5.1 mmol/L 4.0 4.3 4.4  Chloride 101 - 111 mmol/L 102 102 101  CO2 22 - 32 mmol/L _0 Calcium 8.9 - 10.3 mg/dL 9.8 9.9 9.4  Total Protein 6.5 - 8.1 g/dL 8.6(H) - -  Total Bilirubin 0.3 - 1.2 mg/dL 0.5 - -  Alkaline Phos 38 - 126 U/L 49 - -  AST 15 - 41 U/L 24 - -  ALT 14 - 54 U/L 13(L) - -    PENDING LABS:  Ferritin pending  DIAGNOSTIC IMAGING:    PATHOLOGY:     ASSESSMENT & PLAN:   Anemia:  -  Thought to primarily secondary to stage 4 chronic kidney disease, as well as possible hemolysis with mechanical mitral and aortic valve replacements. Also may have element of iron deficiency as well.  -She has been on Procrit for 3 years now, with overall stability in her hemoglobin.  We will continue Procrit 40,000 units injections every 3 weeks for Hgb </= 11.0.   -Hgb 10.7 today; she will receive Procrit as scheduled today.  -Also has history of iron deficiency component to her anemia. Has not required IV iron infusion with Feraheme since 04/06/13.  Iron studies are pending. We will contact the patient with the results of these labs and make arrangements for IV iron, if appropriate.   Fatigue:  -Likely multifactorial given her age, anemia, chronic kidney disease, chronic arthralgia pain, reported sleep disturbances, etc.    -Encouraged her to exercise as often as tolerated and ambulate with her cane.   Chronic arthralgia to (R) knee/leg:  -She tells me that she takes 1-1.5 tabs Norco prn for pain. Pain managed by Dr. Luna Glasgow. Encouraged her to talk to him about taking more than prescribed (1 tab is prescribed and she takes 1.5 tabs) so that he may be able to make dose adjustments if needed.   -No swelling or concerns for LE DVT today. Right leg Homan's sign negative.   -Continue follow-up with Dr. Luna Glasgow and/or PCP as directed.   Sleep disturbance:  -Likely due to sleeping too often during the day.  -Encouraged her to exercise as tolerated to see if this may help with her sleep/wake cycle.       Dispo:  -Continue labs and Procrit injections every 3 weeks. (CBC with diff, ferritin); standing labs ordered. -Return to cancer center for follow-up in 3 months with labs (CBC with diff, ferritin, CMET); ordered.    All questions were answered to patient's stated satisfaction. Encouraged patient to call with any new concerns or questions before her next visit to the cancer center and we can certain see her sooner, if needed.    Plan of care discussed with Dr. Talbert Cage, who agrees with the above aforementioned.    Orders placed this encounter:  Orders Placed This Encounter  Procedures  . CBC with Differential/Platelet  . Ferritin  . Comprehensive metabolic panel      Mike Craze, NP [place] [phone]

## 2017-02-21 NOTE — Progress Notes (Signed)
[name] [name] tolerated Procrit injection well without complaints or incident. Hgb 10.7 today. Pt discharged via wheelchair in satisfactory condition accompanied by family member

## 2017-02-21 NOTE — Patient Instructions (Addendum)
[place] at [facility] Discharge Instructions  RECOMMENDATIONS MADE BY THE CONSULTANT AND ANY TEST RESULTS WILL BE SENT TO YOUR REFERRING PHYSICIAN.  You were seen today by Mike Craze NP. Continue labs and Procrit injection every 3 weeks. Return in 3 months for labs, Procrit and follow up.    Thank you for choosing [facility] at [facility] to provide your oncology and hematology care.  To afford each patient quality time with our provider, please arrive at least 15 minutes before your scheduled appointment time.    If you have a lab appointment with the [facility] please come in thru the  Main Entrance and check in at the main information desk  You need to re-schedule your appointment should you arrive 10 or more minutes late.  We strive to give you quality time with our providers, and arriving late affects you and other patients whose appointments are after yours.  Also, if you no show three or more times for appointments you may be dismissed from the clinic at the providers discretion.     Again, thank you for choosing [facility].  Our hope is that these requests will decrease the amount of time that you wait before being seen by our physicians.       _____________________________________________________________  Should you have questions after your visit to [facility], please contact our office at [phone] between the hours of 8:30 a.m. and 4:30 p.m.  Voicemails left after 4:30 p.m. will not be returned until the following business day.  For prescription refill requests, have your pharmacy contact our office.       Resources For Cancer Patients and their Caregivers ? American Cancer Society: Can assist with transportation, wigs, general needs, runs Look Good Feel Better.        (623)704-9010 ? Cancer Care: Provides financial assistance, online support groups, medication/co-pay assistance.   1-800-813-HOPE 406 434 6984) ? [facility] [facility] Assists [place] cancer patients and their families through emotional , educational and financial support.  517-162-0212 ? [place] DSS Where to apply for food stamps, Medicaid and utility assistance. 731-024-4340 ? [employer]: Transportation to medical appointments. (905)548-7098 ? Social Security Administration: May apply for disability if have a Stage IV cancer. 804-289-5245 (980)287-8879 ? [employer], Disability and Transit Services: Assists with nutrition, care and transit needs. [facility] Support Programs: @10RELATIVEDAYS @ > Cancer Support Group  2nd Tuesday of the month 1pm-2pm, Journey Room  > Creative Journey  3rd Tuesday of the month 1130am-1pm, Journey Room  > Look Good Feel Better  1st Wednesday of the month 10am-12 noon, Journey Room (Call [facility] to register [phone])

## 2017-02-21 NOTE — Patient Instructions (Signed)
[facility] at [facility] Discharge Instructions  RECOMMENDATIONS MADE BY THE CONSULTANT AND ANY TEST RESULTS WILL BE SENT TO YOUR REFERRING PHYSICIAN.  Received Procrit injection today. Follow-up as scheduled. Call clinic for any questions or concerns  Thank you for choosing [facility] at [facility] to provide your oncology and hematology care.  To afford each patient quality time with our provider, please arrive at least 15 minutes before your scheduled appointment time.    If you have a lab appointment with the Cancer Center please come in thru the  Main Entrance and check in at the main information desk  You need to re-schedule your appointment should you arrive 10 or more minutes late.  We strive to give you quality time with our providers, and arriving late affects you and other patients whose appointments are after yours.  Also, if you no show three or more times for appointments you may be dismissed from the clinic at the providers discretion.     Again, thank you for choosing [facility].  Our hope is that these requests will decrease the amount of time that you wait before being seen by our physicians.       _____________________________________________________________  Should you have questions after your visit to [facility], please contact our office at [phone] between the hours of 8:30 a.m. and 4:30 p.m.  Voicemails left after 4:30 p.m. will not be returned until the following business day.  For prescription refill requests, have your pharmacy contact our office.       Resources For Cancer Patients and their Caregivers ? American Cancer Society: Can assist with transportation, wigs, general needs, runs Look Good Feel Better.        1-888-227-6333 ? Cancer Care: Provides financial assistance, online support groups, medication/co-pay assistance.  1-800-813-HOPE (4673) ? [facility] Assists [place] cancer patients and their families through emotional , educational and financial support.  336-427-4357 ? [place] DSS Where to apply for food stamps, Medicaid and utility assistance. 336-342-1394 ? [facility]: Transportation to medical appointments. 336-347-2287 ? Social Security Administration: May apply for disability if have a Stage IV cancer. 336-342-7796 1-800-772-1213 ? [place] [facility], Disability and Transit Services: Assists with nutrition, care and transit needs. 336-349-2343  Cancer Center Support Programs: @10RELATIVEDAYS@ > Cancer Support Group  2nd Tuesday of the month 1pm-2pm, Journey Room  > Creative Journey  3rd Tuesday of the month 1130am-1pm, Journey Room  > Look Good Feel Better  1st Wednesday of the month 10am-12 noon, Journey Room (Call American Cancer Society to register 1-800-395-5775)   

## 2017-02-21 NOTE — Telephone Encounter (Signed)
Faxed signed INR order to Jerel Shepherd at [facility] on 02/16/17.

## 2017-02-22 ENCOUNTER — Other Ambulatory Visit: Payer: Self-pay | Admitting: *Deleted

## 2017-02-22 LAB — HEMOGLOBIN A1C
Hgb A1c MFr Bld: 5.7 % — ABNORMAL HIGH (ref 4.8–5.6)
Mean Plasma Glucose: 117 mg/dL

## 2017-02-22 LAB — THYROID STIMULATING IMMUNOGLOBULIN: Thyroid Stimulating Immunoglob: <0.1 IU/L (ref 0.00–0.55)

## 2017-02-22 LAB — THYROTROPIN RECEPTOR AUTOABS: Thyrotropin Receptor Ab: 0.63 IU/L (ref 0.00–1.75)

## 2017-02-22 LAB — VITAMIN D 25 HYDROXY (VIT D DEFICIENCY, FRACTURES): Vit D, 25-Hydroxy: 38.7 ng/mL (ref 30.0–100.0)

## 2017-02-27 ENCOUNTER — Other Ambulatory Visit: Payer: Self-pay | Admitting: Cardiovascular Disease

## 2017-02-27 ENCOUNTER — Ambulatory Visit (INDEPENDENT_AMBULATORY_CARE_PROVIDER_SITE_OTHER): Payer: Medicare Other | Admitting: Family Medicine

## 2017-02-27 VITALS — BP 120/60 | HR 86 | Resp 16 | Ht 67.0 in | Wt 226.0 lb

## 2017-02-27 DIAGNOSIS — D242 Benign neoplasm of left breast: Secondary | ICD-10-CM | POA: Diagnosis not present

## 2017-02-27 DIAGNOSIS — I255 Ischemic cardiomyopathy: Secondary | ICD-10-CM

## 2017-02-27 DIAGNOSIS — Z7901 Long term (current) use of anticoagulants: Secondary | ICD-10-CM | POA: Diagnosis not present

## 2017-02-27 DIAGNOSIS — E785 Hyperlipidemia, unspecified: Secondary | ICD-10-CM

## 2017-02-27 DIAGNOSIS — I10 Essential (primary) hypertension: Secondary | ICD-10-CM

## 2017-02-27 DIAGNOSIS — E890 Postprocedural hypothyroidism: Secondary | ICD-10-CM | POA: Diagnosis not present

## 2017-02-27 DIAGNOSIS — R7303 Prediabetes: Secondary | ICD-10-CM | POA: Diagnosis not present

## 2017-02-27 DIAGNOSIS — I5022 Chronic systolic (congestive) heart failure: Secondary | ICD-10-CM | POA: Diagnosis not present

## 2017-02-27 DIAGNOSIS — M25551 Pain in right hip: Secondary | ICD-10-CM | POA: Diagnosis not present

## 2017-02-27 MED ORDER — POTASSIUM CHLORIDE CRYS ER 20 MEQ PO TBCR: EXTENDED_RELEASE_TABLET | ORAL | 1 refills | Status: DC

## 2017-02-27 NOTE — Patient Instructions (Addendum)
F/u in 4.5 months, call if you need me before  Keep June appt for wellness as before with nurse  Your labs are very good no changes in your medications  Please reduce fried and fatty foods, triglycerides are a bit high   Thank you  for choosing [facility]. We consider it a privelige to serve you.  Delivering excellent health care in a caring and  compassionate way is our goal.  Partnering with you,  so that together we can achieve this goal is our strategy.       Fall Prevention in the Home Falls can cause injuries. They can happen to people of all ages. There are many things you can do to make your home safe and to help prevent falls. What can I do on the outside of my home?  Regularly fix the edges of walkways and driveways and fix any cracks.  Remove anything that might make you trip as you walk through a door, such as a raised step or threshold.  Trim any bushes or trees on the path to your home.  Use bright outdoor lighting.  Clear any walking paths of anything that might make someone trip, such as rocks or tools.  Regularly check to see if handrails are loose or broken. Make sure that both sides of any steps have handrails.  Any raised decks and porches should have guardrails on the edges.  Have any leaves, snow, or ice cleared regularly.  Use sand or salt on walking paths during winter.  Clean up any spills in your garage right away. This includes oil or grease spills. What can I do in the bathroom?  Use night lights.  Install grab bars by the toilet and in the tub and shower. Do not use towel bars as grab bars.  Use non-skid mats or decals in the tub or shower.  If you need to sit down in the shower, use a plastic, non-slip stool.  Keep the floor dry. Clean up any water that spills on the floor as soon as it happens.  Remove soap buildup in the tub or shower regularly.  Attach bath mats securely with double-sided non-slip rug tape.  Do  not have throw rugs and other things on the floor that can make you trip. What can I do in the bedroom?  Use night lights.  Make sure that you have a light by your bed that is easy to reach.  Do not use any sheets or blankets that are too big for your bed. They should not hang down onto the floor.  Have a firm chair that has side arms. You can use this for support while you get dressed.  Do not have throw rugs and other things on the floor that can make you trip. What can I do in the kitchen?  Clean up any spills right away.  Avoid walking on wet floors.  Keep items that you use a lot in easy-to-reach places.  If you need to reach something above you, use a strong step stool that has a grab bar.  Keep electrical cords out of the way.  Do not use floor polish or wax that makes floors slippery. If you must use wax, use non-skid floor wax.  Do not have throw rugs and other things on the floor that can make you trip. What can I do with my stairs?  Do not leave any items on the stairs.  Make sure that there are handrails on both  sides of the stairs and use them. Fix handrails that are broken or loose. Make sure that handrails are as long as the stairways.  Check any carpeting to make sure that it is firmly attached to the stairs. Fix any carpet that is loose or worn.  Avoid having throw rugs at the top or bottom of the stairs. If you do have throw rugs, attach them to the floor with carpet tape.  Make sure that you have a light switch at the top of the stairs and the bottom of the stairs. If you do not have them, ask someone to add them for you. What else can I do to help prevent falls?  Wear shoes that: ? Do not have high heels. ? Have rubber bottoms. ? Are comfortable and fit you well. ? Are closed at the toe. Do not wear sandals.  If you use a stepladder: ? Make sure that it is fully opened. Do not climb a closed stepladder. ? Make sure that both sides of the stepladder  are locked into place. ? Ask someone to hold it for you, if possible.  Clearly mark and make sure that you can see: ? Any grab bars or handrails. ? First and last steps. ? Where the edge of each step is.  Use tools that help you move around (mobility aids) if they are needed. These include: ? Canes. ? Walkers. ? Scooters. ? Crutches.  Turn on the lights when you go into a dark area. Replace any light bulbs as soon as they burn out.  Set up your furniture so you have a clear path. Avoid moving your furniture around.  If any of your floors are uneven, fix them.  If there are any pets around you, be aware of where they are.  Review your medicines with your doctor. Some medicines can make you feel dizzy. This can increase your chance of falling. Ask your doctor what other things that you can do to help prevent falls. This information is not intended to replace advice given to you by your health care provider. Make sure you discuss any questions you have with your health care provider. Document Released: 07/08/2009 Document Revised: 02/17/2016 Document Reviewed: 10/16/2014 Elsevier Interactive Patient Education  [employer].

## 2017-02-28 ENCOUNTER — Other Ambulatory Visit: Payer: Self-pay

## 2017-02-28 MED ORDER — DIGOXIN 125 MCG PO TABS: 0.1250 mg | ORAL_TABLET | ORAL | 3 refills | Status: DC

## 2017-03-01 ENCOUNTER — Encounter: Payer: Medicare Other | Admitting: Internal Medicine

## 2017-03-02 NOTE — Addendum Note (Signed)
Addended by: Lianne Cure A on: 03/02/2017 01:33 PM   Modules accepted: Orders

## 2017-03-06 ENCOUNTER — Other Ambulatory Visit: Payer: Self-pay | Admitting: Ophthalmology

## 2017-03-06 DIAGNOSIS — H0520 Unspecified exophthalmos: Secondary | ICD-10-CM

## 2017-03-06 DIAGNOSIS — H468 Other optic neuritis: Secondary | ICD-10-CM

## 2017-03-07 ENCOUNTER — Encounter: Payer: Self-pay | Admitting: Orthopaedic Surgery

## 2017-03-07 ENCOUNTER — Encounter: Payer: Self-pay | Admitting: Family Medicine

## 2017-03-07 ENCOUNTER — Ambulatory Visit (INDEPENDENT_AMBULATORY_CARE_PROVIDER_SITE_OTHER): Payer: Medicare Other | Admitting: Orthopaedic Surgery

## 2017-03-07 VITALS — BP 118/73 | HR 87 | Temp 97.2°F | Ht 67.0 in | Wt 225.0 lb

## 2017-03-07 DIAGNOSIS — M25561 Pain in right knee: Secondary | ICD-10-CM

## 2017-03-07 DIAGNOSIS — G8929 Other chronic pain: Secondary | ICD-10-CM

## 2017-03-07 DIAGNOSIS — M7061 Trochanteric bursitis, right hip: Secondary | ICD-10-CM | POA: Diagnosis not present

## 2017-03-07 NOTE — Assessment & Plan Note (Signed)
Hyperlipidemia:Low fat diet discussed and encouraged.   Lipid Panel  Lab Results  Component Value Date   CHOL 178 02/21/2017   HDL 30 (L) 02/21/2017   LDLCALC 96 02/21/2017   TRIG 261 (H) 02/21/2017   CHOLHDL 5.9 02/21/2017   uncontrolled , needs to reduce fried and fatty foods and cheese and butter

## 2017-03-07 NOTE — Assessment & Plan Note (Signed)
Controlled, no change in medication  

## 2017-03-07 NOTE — Assessment & Plan Note (Signed)
Controlled, no change in medication DASH diet and commitment to daily physical activity for a minimum of 30 minutes discussed and encouraged, as a part of hypertension management. The importance of attaining a healthy weight is also discussed.  BP/Weight 03/07/2017 02/27/2017 02/22/2017 02/21/2017 02/20/2017 02/08/2017 7/61/9509  Systolic BP 326 712 458 099 833 825 053  Diastolic BP 73 60 74 70 78 70 75  Wt. (Lbs) 225 226 217 222 222.13 - 222  BMI 35.24 35.4 33.99 34.77 34.79 - 34.77

## 2017-03-07 NOTE — Assessment & Plan Note (Signed)
Increased and uncontrolled , pain managed by ortho, pt contemplating alternate therapy

## 2017-03-07 NOTE — Assessment & Plan Note (Signed)
coumadin monitored by cardiology , challenging anticoagulation

## 2017-03-07 NOTE — Progress Notes (Signed)
[name] [name]     MRN: [id number]      DOB: 1931-12-28   HPI Ms. Montenegro is here for follow up and re-evaluation of chronic medical conditions, medication management and review of any available recent lab and radiology data.  Preventive health is updated, specifically  Cancer screening and Immunization.   Questions or concerns regarding consultations or procedures which the PT has had in the interim are  addressed. The PT denies any adverse reactions to current medications since the last visit.  C/o chronic and uncontrolled back and right hip and knee pain for which she sees orthopedics  ROS Denies recent fever or chills. Denies sinus pressure, nasal congestion, ear pain or sore throat. Denies chest congestion, productive cough or wheezing. Denies chest pains, palpitations and leg swelling Denies abdominal pain, nausea, vomiting,diarrhea or constipation.   Denies dysuria, frequency, hesitancy or incontinence.  Denies headaches, seizures, numbness, or tingling. Denies depression, anxiety or insomnia. Denies skin break down or rash.   PE  BP 120/60   Pulse 86   Resp 16   Ht 5\' 7"  (1.702 m)   Wt 226 lb (102.5 kg)   SpO2 92%   BMI 35.40 kg/m   Patient alert and oriented and in no cardiopulmonary distress.  HEENT: No facial asymmetry, EOMI,   oropharynx pink and moist.  Neck supple no JVD, no mass.  Chest: Clear to auscultation bilaterally.  CVS: S1, S2 systolic murmur and click, no S3.IrRegular rate.  ABD: Soft non tender.   Ext: No edema  MS: decreased ROM spine, shoulders, hips and knees.  Skin: Intact, no ulcerations or rash noted.  Psych: Good eye contact, normal affect. Memory intact not anxious or depressed appearing.  CNS: CN 2-12 intact, power,  normal throughout.no focal deficits noted.   Assessment & Plan Essential hypertension Controlled, no change in medication DASH diet and commitment to daily physical activity for a minimum of 30 minutes  discussed and encouraged, as a part of hypertension management. The importance of attaining a healthy weight is also discussed.  BP/Weight 03/07/2017 02/27/2017 02/22/2017 02/21/2017 02/20/2017 02/08/2017 5/36/1443  Systolic BP 154 008 676 195 093 267 124  Diastolic BP 73 60 74 70 78 70 75  Wt. (Lbs) 225 226 217 222 222.13 - 222  BMI 35.24 35.4 33.99 34.77 34.79 - 34.77       Hyperlipidemia LDL goal <70 Hyperlipidemia:Low fat diet discussed and encouraged.   Lipid Panel  Lab Results  Component Value Date   CHOL 178 02/21/2017   HDL 30 (L) 02/21/2017   LDLCALC 96 02/21/2017   TRIG 261 (H) 02/21/2017   CHOLHDL 5.9 02/21/2017   uncontrolled , needs to reduce fried and fatty foods and cheese and butter    Hypothyroidism, postsurgical Controlled, no change in medication   Prediabetes Patient educated about the importance of limiting  Carbohydrate intake , the need to commit to daily physical activity for a minimum of 30 minutes , and to commit weight loss. The fact that changes in all these areas will reduce or eliminate all together the development of diabetes is stressed.   Diabetic Labs Latest Ref Rng & Units 02/21/2017 02/21/2017 11/29/2016 11/17/2016 11/16/2016  HbA1c 4.8 - 5.6 % 5.7(H) - - - -  Microalbumin <2.0 mg/dL - - - - -  Micro/Creat Ratio 0.0 - 30.0 mg/g - - - - -  Chol 0 - 200 mg/dL 178 - - - -  HDL >40 mg/dL 30(L) - - - -  Calc LDL 0 - 99 mg/dL 96 - - - -  Triglycerides <150 mg/dL 261(H) - - - -  Creatinine 0.44 - 1.00 mg/dL 1.62(H) 1.65(H) 1.41(H) 1.51(H) 1.61(H)   BP/Weight 03/07/2017 02/27/2017 02/22/2017 02/21/2017 02/20/2017 02/08/2017 3/77/9396  Systolic BP 886 484 720 721 828 833 744  Diastolic BP 73 60 74 70 78 70 75  Wt. 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

## 2017-03-07 NOTE — Progress Notes (Signed)
PROCEDURE NOTE:  The patient requests injections of the right knee , verbal consent was obtained.  The right knee was prepped appropriately after time out was performed.   Sterile technique was observed and injection of 1 cc of Depo-Medrol 40 mg with several cc's of plain xylocaine. Anesthesia was provided by ethyl chloride and a 20-gauge needle was used to inject the knee area. The injection was tolerated well.  A band aid dressing was applied.  The patient was advised to apply ice later today and tomorrow to the injection sight as needed.   PROCEDURE NOTE:  The patient request injection, verbal consent was obtained.  The right trochanteric area of the hip was prepped appropriately after time out was performed.   Sterile technique was observed and injection of 1 cc of Depo-Medrol 40 mg with several cc's of plain xylocaine. Anesthesia was provided by ethyl chloride and a 20-gauge needle was used to inject the hip area. The injection was tolerated well.  A band aid dressing was applied.  The patient was advised to apply ice later today and tomorrow to the injection sight as needed.  Encounter Diagnoses  Name Primary?  . Trochanteric bursitis, right hip Yes  . Chronic pain of right knee    Return in two months.  Call if any problem.  Precautions discussed.  Electronically Signed Sanjuana Kava, MD 6/13/20181:45 PM

## 2017-03-07 NOTE — Assessment & Plan Note (Signed)
Stable , and asymptomatic

## 2017-03-07 NOTE — Assessment & Plan Note (Signed)
Followed by breast surgeon

## 2017-03-07 NOTE — Assessment & Plan Note (Signed)
Patient educated about the importance of limiting  Carbohydrate intake , the need to commit to daily physical activity for a minimum of 30 minutes , and to commit weight loss. The fact that changes in all these areas will reduce or eliminate all together the development of diabetes is stressed.   Diabetic Labs Latest Ref Rng & Units 02/21/2017 02/21/2017 11/29/2016 11/17/2016 11/16/2016  HbA1c 4.8 - 5.6 % 5.7(H) - - - -  Microalbumin <2.0 mg/dL - - - - -  Micro/Creat Ratio 0.0 - 30.0 mg/g - - - - -  Chol 0 - 200 mg/dL 178 - - - -  HDL >40 mg/dL 30(L) - - - -  Calc LDL 0 - 99 mg/dL 96 - - - -  Triglycerides <150 mg/dL 261(H) - - - -  Creatinine 0.44 - 1.00 mg/dL 1.62(H) 1.65(H) 1.41(H) 1.51(H) 1.61(H)   BP/Weight 03/07/2017 02/27/2017 02/22/2017 02/21/2017 02/20/2017 02/08/2017 5/63/8937  Systolic BP 342 876 811 572 620 355 974  Diastolic BP 73 60 74 70 78 70 75  Wt. (Lbs) 225 226 217 222 222.13 - 222  BMI 35.24 35.4 33.99 34.77 34.79 - 34.77   Foot/eye exam completion dates Latest Ref Rng & Units 07/03/2016 07/05/2015  Eye Exam No Retinopathy Retinopathy(A) No Retinopathy  Foot Form Completion - - -    Excellent blood sugar control on diet only

## 2017-03-12 ENCOUNTER — Ambulatory Visit: Payer: Medicare Other

## 2017-03-12 VITALS — BP 112/60 | HR 80 | Temp 98.7°F | Ht 67.0 in | Wt 221.1 lb

## 2017-03-12 DIAGNOSIS — Z0000 Encounter for general adult medical examination without abnormal findings: Secondary | ICD-10-CM

## 2017-03-12 NOTE — Progress Notes (Signed)
Subjective:   [name] [name] is a 83 y.o. female who presents for Medicare Annual (Subsequent) preventive examination.  Review of Systems:  Cardiac Risk Factors include: advanced age (>70men, >44 women);diabetes mellitus;hypertension;dyslipidemia;obesity (BMI >30kg/m2);sedentary lifestyle     Objective:     Vitals: BP 112/60   Pulse 80   Temp 98.7 F (37.1 C) (Oral)   Ht 5\' 7"  (1.702 m)   Wt 221 lb 1.9 oz (100.3 kg)   BMI 34.63 kg/m   Body mass index is 34.63 kg/m.    Tobacco History  Smoking Status  . Former Smoker  . Packs/day: 0.75  . Years: 40.00  . Types: Cigarettes  . Quit date: 03/08/1987  Smokeless Tobacco  . Never Used     Counseling given: Not Answered   Past Medical History:  Diagnosis Date  . Adenomatous polyp of colon 12/16/2002   Dr. [name]/[facility]  . Allergy   . Cataract   . CHB (complete heart block) ([place]) 10/07/2014      . CHF (congestive heart failure) ([place])       . Chronic kidney disease, stage I 2006  . Constipation       . COPD (chronic obstructive pulmonary disease) ([place])       . DJD (degenerative joint disease) of lumbar spine   . DM (diabetes mellitus) ([place]) 2006  . Esophageal reflux   . Glaucoma   . Gout       . Heart murmur   . Hyperlipemia   . IDA (iron deficiency anemia)    Parenteral iron/[name]  . Insomnia       . Non-ischemic cardiomyopathy ([place])    a. s/p MDT CRTD  . Obesity, unspecified   . Other and unspecified hyperlipidemia   . Oxygen deficiency 2014   nocturnal;  . Spondylolisthesis   . Spondylosis   . Thyroid cancer ([place])    remote thyroidectomy, no recurrence, pt denies in 2016 thyroid cancer  . Unspecified hypothyroidism       . 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 Vertigo        Past Surgical History:  Procedure Laterality Date  . AORTIC AND MITRAL VALVE REPLACEMENT  2001  . BI-VENTRICULAR IMPLANTABLE CARDIOVERTER  DEFIBRILLATOR UPGRADE N/A 01/18/2015   Procedure: BI-VENTRICULAR IMPLANTABLE CARDIOVERTER DEFIBRILLATOR UPGRADE;  Surgeon: Evans Lance, MD;  Location: [facility] CATH LAB;  Service: Cardiovascular;  Laterality: N/A;  . CARDIAC CATHETERIZATION    . CARDIAC VALVE REPLACEMENT    . CARDIOVERSION N/A 11/13/2016   Procedure: CARDIOVERSION;  Surgeon: Sanda Klein, MD;  Location: [facility] ENDOSCOPY;  Service: Cardiovascular;  Laterality: N/A;  . COLONOSCOPY  06/12/2007   Rourk- Normal rectum/Normal colon  . COLONOSCOPY  12/16/02   small hemorrhoids  . COLONOSCOPY  06/20/2012   Procedure: COLONOSCOPY;  Surgeon: Daneil Dolin, MD;  [facility]: [facility] ENDO SU[facility];  Service: Endoscopy;  Laterality: N/A;  10:45  . defibrillator implanted 2006    . DOPPLER ECHOCARDIOGRAPHY  2012  . DOPPLER ECHOCARDIOGRAPHY  05,06,07,08,09,2011  . ESOPHAGOGASTRODUODENOSCOPY  06/12/2007   Rourk- normal esophagus, small hiatal hernia, otherwise normal stomach, D1, D2  . EYE SURGERY Right 2005  . EYE SURGERY Left 2006  . ICD LEAD REMOVAL Left 02/08/2015   Procedure: ICD LEAD REMOVAL;  Surgeon: Evans Lance, MD;  Location[facility];  Service: Cardiovascular;  Laterality: Left;  "Will plan extraction and insertion of a BiV PM"  **Dr. Roxan Hockey backing up case**  .  IMPLANTABLE CARDIOVERTER DEFIBRILLATOR (ICD) GENERATOR CHANGE Left 02/08/2015   Procedure: ICD GENERATOR CHANGE;  Surgeon: Evans Lance, MD;  Location: [facility];  Service: Cardiovascular;  Laterality: Left;  . INSERT / REPLACE / REMOVE PACEMAKER    . PACEMAKER INSERTION  May 2016  . pacemaker placed  2004  . right breast cyst removed     benign   . right cataract removed     2005  . TEE WITHOUT CARDIOVERSION N/A 11/13/2016   Procedure: TRANSESOPHAGEAL ECHOCARDIOGRAM (TEE);  Surgeon: Sanda Klein, MD;  Location: [facility] ENDOSCOPY;  Service: Cardiovascular;  Laterality: N/A;  . THYROIDECTOMY     Family History  Problem Relation Age of Onset  . Pancreatic cancer Mother   . Heart  disease Father   . Heart disease Sister   . Heart attack Sister   . Heart disease Brother   . Diabetes Brother   . Heart disease Brother   . Diabetes Brother   . Hypertension Brother   . Lung cancer Brother    History  Sexual Activity  . Sexual activity: Not Currently    Outpatient Encounter Prescriptions as of 03/12/2017  Medication Sig  . acetaminophen (TYLENOL) 500 MG tablet Take 500 mg by mouth every 6 (six) hours as needed (pain).  Marland Kitchen albuterol (PROVENTIL) (2.5 MG/3ML) 0.083% nebulizer solution INHALE 1 VIAL VIA NEBULIZER THREE TIMES DAILY. (Patient taking differently: INHALE 1 VIAL VIA NEBULIZER THREE TIMES DAILY AS NEEDED)  . albuterol (VENTOLIN HFA) 108 (90 Base) MCG/ACT inhaler Inhale 2 puffs into the lungs every 6 (six) hours as needed for wheezing or shortness of breath.  . allopurinol (ZYLOPRIM) 100 MG tablet Take 1 tablet (100 mg total) by mouth daily.  Marland Kitchen azelastine (ASTELIN) 0.1 % nasal spray Place 2 sprays into both nostrils 2 (two) times daily. Use in each nostril as directed  . calcitRIOL (ROCALTROL) 0.25 MCG capsule Take 0.25 mcg by mouth daily.   . cholecalciferol (VITAMIN D) 1000 UNITS tablet Take 1,000 Units by mouth daily.  Marland Kitchen COUMADIN 5 MG tablet Take 2 tablets daily (Patient taking differently: Take 10 mg by mouth every evening. Take 5mg  on Tuesday and take 10mg  on all other days)  . digoxin (LANOXIN) 0.125 MG tablet Take 1 tablet (0.125 mg total) by mouth every other day.  . docusate sodium (COLACE) 100 MG capsule Take 300 mg by mouth at bedtime.  . fluticasone (FLONASE) 50 MCG/ACT nasal spray USE 2 SPRAYS IN EACH       NOSTRIL ONCE DAILY (Patient taking differently: USE 2 SPRAYS IN EACH       NOSTRIL ONCE DAILY AS NEEDED FOR CONGESTION)  . folic acid (FOLVITE) 1 MG tablet Take 1 tablet (1 mg total) by mouth daily.  . furosemide (LASIX) 40 MG tablet Take 1 tablet by mouth  daily  . gabapentin (NEURONTIN) 300 MG capsule Take 1 capsule (300 mg total) by mouth 2 (two)  times daily. (Patient taking differently: Take 300 mg by mouth at bedtime. )  . hydrALAZINE (APRESOLINE) 25 MG tablet Take 1 tablet (25 mg total) by mouth 3 (three) times daily.  Marland Kitchen HYDROcodone-acetaminophen (NORCO) 7.5-325 MG tablet Take 1 tablet by mouth every 6 (six) hours as needed for moderate pain (Must last 30 days.Do not drive or operate machinery while taking this medicine.).  Marland Kitchen isosorbide mononitrate (IMDUR) 60 MG 24 hr tablet take 1 tablet by mouth once daily  . losartan (COZAAR) 50 MG tablet take 1 tablet by mouth once daily  .  meclizine (ANTIVERT) 25 MG tablet Take 25 mg by mouth 3 (three) times daily as needed for dizziness.  . metolazone (ZAROXOLYN) 2.5 MG tablet Take 1 tablet (2.5 mg total) by mouth once a week.  . ondansetron (ZOFRAN) 4 MG tablet Take 1 tablet (4 mg total) by mouth daily as needed for nausea or vomiting.  [name] [name] (SYSTANE OP) Place 1 drop into both eyes daily as needed (dry eyes).  . potassium chloride SA (KLOR-CON M20) 20 MEQ tablet Take 1 tablet by mouth  daily  . pravastatin (PRAVACHOL) 20 MG tablet Take 1 tablet (20 mg total) by mouth daily.  . promethazine-dextromethorphan (PROMETHAZINE-DM) 6.25-15 MG/5ML syrup One teaspoon at bedtime, as needed, for excessive cough (Patient taking differently: Take 5 mLs by mouth at bedtime as needed for cough. as needed, for excessive cough)  . SYNTHROID 75 MCG tablet Take 1 tablet by mouth  daily  . TOPROL XL 100 MG 24 hr tablet Take 1 tablet (100 mg total) by mouth daily. Take with or immediately following a meal.   Facility-Administered Encounter Medications as of 03/12/2017  Medication  . epoetin alfa (EPOGEN,PROCRIT) injection 40,000 Units    Activities of Daily Living In your present state of health, do you have any difficulty performing the following activities: 03/12/2017 11/30/2016  Hearing? N N  Vision? N N  Difficulty concentrating or making decisions? N N  Walking or climbing stairs?  Y Y  Dressing or bathing? N N  Doing errands, shopping? Y N  Preparing Food and eating ? N N  Using the Toilet? N N  In the past six months, have you accidently leaked urine? N N  Do you have problems with loss of bowel control? N N  Managing your Medications? N N  Managing your Finances? N N  Housekeeping or managing your Housekeeping? N Y  Some recent data might be hidden    Patient Care Team: Fayrene Helper, MD as PCP - General Evans Lance, MD as Consulting Physician (Cardiology) Sheldon Silvan, Scharlene Corn as Physician Assistant (Oncology) Shea Evans, [name], LCSW as [place] Management (Licensed Clinical Social Worker) Madelin Headings, DO (Optometry) Fran Lowes, MD as Consulting Physician (Nephrology) Barbaraann Faster, RN as [place] Management Nickel, Sharmon Leyden, NP as Nurse Practitioner (Vascular Surgery) Sanjuana Kava, MD as Consulting Physician (Orthopedic Surgery) Herminio Commons, MD as Attending Physician (Cardiology)    Assessment:    Exercise Activities and Dietary recommendations Current Exercise Habits: The patient does not participate in regular exercise at present, Exercise limited by: cardiac condition(s)  Goals    . Exercise 3x per week (30 min per time)          Recommend starting a routine exercise program at least 3 days a week for 30-45 minutes at a time as tolerated.        Fall Risk Fall Risk  03/12/2017 12/25/2016 11/30/2016 11/03/2016 10/13/2016  Falls in the past year? No No No Yes Yes  Number falls in past yr: - - - - 1  Injury with Fall? - - - - -  Risk for fall due to : Impaired balance/gait;Impaired mobility Impaired balance/gait;Medication side effect Impaired balance/gait;Medication side effect - -  Follow up - - - - -   Depression Screen PHQ 2/9 Scores 03/12/2017 11/30/2016 11/03/2016 10/13/2016  PHQ - 2 Score 0 1 0 0  PHQ- 9 Score - - - -     Cognitive Function: Normal  6CIT Screen  03/12/2017  What Year? 0 points  What month? 0 points  What time? 0 points  Count back from 20 0 points  Months in reverse 0 points  Repeat phrase 0 points  Total Score 0    Immunization History  Administered Date(s) Administered  . Influenza Split 06/19/2011, 07/17/2012  . Influenza Whole 06/20/2007, 06/22/2008, 06/24/2009, 05/27/2010  . Influenza,inj,Quad PF,36+ Mos 07/15/2013, 06/03/2014, 06/21/2015, 07/06/2016, 11/17/2016  . Pneumococcal Conjugate-13 10/13/2014  . Pneumococcal Polysaccharide-23 06/10/2008  . Td 08/31/2004  . Tdap 02/17/2015   Screening Tests Health Maintenance  Topic Date Due  . FOOT EXAM  03/20/2017  . INFLUENZA VACCINE  04/25/2017  . OPHTHALMOLOGY EXAM  07/03/2017  . HEMOGLOBIN A1C  08/24/2017  . TETANUS/TDAP  02/16/2025  . DEXA SCAN  Completed  . PNA vac Low Risk Adult  Completed      Plan:   I have personally reviewed and noted the following in the patient's chart:   . Medical and social history . Use of alcohol, tobacco or illicit drugs  . Current medications and supplements . Functional ability and status . Nutritional status . Physical activity . Advanced directives . List of other physicians . Hospitalizations, surgeries, and ER visits in previous 12 months . Vitals . Screenings to include cognitive, depression, and falls . Referrals and appointments: [employer] referral sent today.  In addition, I have reviewed and discussed with patient certain preventive protocols, quality metrics, and best practice recommendations. A written personalized care plan for preventive services as well as general preventive health recommendations were provided to patient.     Stormy Fabian, LPN  [id number]

## 2017-03-12 NOTE — Patient Instructions (Addendum)
[name] , Thank you for taking time to come for your Medicare Wellness Visit. I appreciate your ongoing commitment to your health goals. Please review the following plan we discussed and let me know if I can assist you in the future.   Screening recommendations/referrals: Colonoscopy: Up to date and no longer required Mammogram: Up to date, next due 04/2017 Bone Density: Up to date Recommended yearly ophthalmology/optometry visit for glaucoma screening and checkup Recommended yearly dental visit for hygiene and checkup  Vaccinations: Influenza vaccine: Up to date, next due 05/2017 Pneumococcal vaccine: Up to date Tdap vaccine: Up to date, next due 01/2025 Shingles vaccine: Due, declines    Advanced directives: Advance directive discussed with you today. Even though you declined this today please call our office if you should change your mind and we can provide the forms for you to fill out.  Conditions/risks identified: Obese, recommend starting a routine exercise program at least 3 days a week for 30-45 minutes at a time as tolerated.  Next appointment: Follow up with Dr. [name] on 07/11/2017 at 11:00 am. Follow up in 1 year for your annual wellness visit.   Preventive Care 35 Years and Older, Female Preventive care refers to lifestyle choices and visits with your health care provider that can promote health and wellness. What does preventive care include?  A yearly physical exam. This is also called an annual well check.  Dental exams once or twice a year.  Routine eye exams. Ask your health care provider how often you should have your eyes checked.  Personal lifestyle choices, including:  Daily care of your teeth and gums.  Regular physical activity.  Eating a healthy diet.  Avoiding tobacco and drug use.  Limiting alcohol use.  Practicing safe sex.  Taking low-dose aspirin every day.  Taking vitamin and mineral supplements as recommended by your health care  provider. What happens during an annual well check? The services and screenings done by your health care provider during your annual well check will depend on your age, overall health, lifestyle risk factors, and family history of disease. Counseling  Your health care provider may ask you questions about your:  Alcohol use.  Tobacco use.  Drug use.  Emotional well-being.  Home and relationship well-being.  Sexual activity.  Eating habits.  History of falls.  Memory and ability to understand (cognition).  Work and work Statistician.  Reproductive health. Screening  You may have the following tests or measurements:  Height, weight, and BMI.  Blood pressure.  Lipid and cholesterol levels. These may be checked every 5 years, or more frequently if you are over 63 years old.  Skin check.  Lung cancer screening. You may have this screening every year starting at age 30 if you have a 30-pack-year history of smoking and currently smoke or have quit within the past 15 years.  Fecal occult blood test (FOBT) of the stool. You may have this test every year starting at age 66.  Flexible sigmoidoscopy or colonoscopy. You may have a sigmoidoscopy every 5 years or a colonoscopy every 10 years starting at age 52.  Hepatitis C blood test.  Hepatitis B blood test.  Sexually transmitted disease (STD) testing.  Diabetes screening. This is done by checking your blood sugar (glucose) after you have not eaten for a while (fasting). You may have this done every 1-3 years.  Bone density scan. This is done to screen for osteoporosis. You may have this done starting at age 59.  Mammogram. This may be done every 1-2 years. Talk to your health care provider about how often you should have regular mammograms. Talk with your health care provider about your test results, treatment options, and if necessary, the need for more tests. Vaccines  Your health care provider may recommend certain  vaccines, such as:  Influenza vaccine. This is recommended every year.  Tetanus, diphtheria, and acellular pertussis (Tdap, Td) vaccine. You may need a Td booster every 10 years.  Zoster vaccine. You may need this after age 59.  Pneumococcal 13-valent conjugate (PCV13) vaccine. One dose is recommended after age 22.  Pneumococcal polysaccharide (PPSV23) vaccine. One dose is recommended after age 69. Talk to your health care provider about which screenings and vaccines you need and how often you need them. This information is not intended to replace advice given to you by your health care provider. Make sure you discuss any questions you have with your health care provider. Document Released: 10/08/2015 Document Revised: 05/31/2016 Document Reviewed: 07/13/2015 Elsevier Interactive Patient Education  2017 [place] Prevention in the Home Falls can cause injuries. They can happen to people of all ages. There are many things you can do to make your home safe and to help prevent falls. What can I do on the outside of my home?  Regularly fix the edges of walkways and driveways and fix any cracks.  Remove anything that might make you trip as you walk through a door, such as a raised step or threshold.  Trim any bushes or trees on the path to your home.  Use bright outdoor lighting.  Clear any walking paths of anything that might make someone trip, such as rocks or tools.  Regularly check to see if handrails are loose or broken. Make sure that both sides of any steps have handrails.  Any raised decks and porches should have guardrails on the edges.  Have any leaves, snow, or ice cleared regularly.  Use sand or salt on walking paths during winter.  Clean up any spills in your garage right away. This includes oil or grease spills. What can I do in the bathroom?  Use night lights.  Install grab bars by the toilet and in the tub and shower. Do not use towel bars as grab  bars.  Use non-skid mats or decals in the tub or shower.  If you need to sit down in the shower, use a plastic, non-slip stool.  Keep the floor dry. Clean up any water that spills on the floor as soon as it happens.  Remove soap buildup in the tub or shower regularly.  Attach bath mats securely with double-sided non-slip rug tape.  Do not have throw rugs and other things on the floor that can make you trip. What can I do in the bedroom?  Use night lights.  Make sure that you have a light by your bed that is easy to reach.  Do not use any sheets or blankets that are too big for your bed. They should not hang down onto the floor.  Have a firm chair that has side arms. You can use this for support while you get dressed.  Do not have throw rugs and other things on the floor that can make you trip. What can I do in the kitchen?  Clean up any spills right away.  Avoid walking on wet floors.  Keep items that you use a lot in easy-to-reach places.  If you need to reach  something above you, use a strong step stool that has a grab bar.  Keep electrical cords out of the way.  Do not use floor polish or wax that makes floors slippery. If you must use wax, use non-skid floor wax.  Do not have throw rugs and other things on the floor that can make you trip. What can I do with my stairs?  Do not leave any items on the stairs.  Make sure that there are handrails on both sides of the stairs and use them. Fix handrails that are broken or loose. Make sure that handrails are as long as the stairways.  Check any carpeting to make sure that it is firmly attached to the stairs. Fix any carpet that is loose or worn.  Avoid having throw rugs at the top or bottom of the stairs. If you do have throw rugs, attach them to the floor with carpet tape.  Make sure that you have a light switch at the top of the stairs and the bottom of the stairs. If you do not have them, ask someone to add them for  you. What else can I do to help prevent falls?  Wear shoes that:  Do not have high heels.  Have rubber bottoms.  Are comfortable and fit you well.  Are closed at the toe. Do not wear sandals.  If you use a stepladder:  Make sure that it is fully opened. Do not climb a closed stepladder.  Make sure that both sides of the stepladder are locked into place.  Ask someone to hold it for you, if possible.  Clearly mark and make sure that you can see:  Any grab bars or handrails.  First and last steps.  Where the edge of each step is.  Use tools that help you move around (mobility aids) if they are needed. These include:  Canes.  Walkers.  Scooters.  Crutches.  Turn on the lights when you go into a dark area. Replace any light bulbs as soon as they burn out.  Set up your furniture so you have a clear path. Avoid moving your furniture around.  If any of your floors are uneven, fix them.  If there are any pets around you, be aware of where they are.  Review your medicines with your doctor. Some medicines can make you feel dizzy. This can increase your chance of falling. Ask your doctor what other things that you can do to help prevent falls. This information is not intended to replace advice given to you by your health care provider. Make sure you discuss any questions you have with your health care provider. Document Released: 07/08/2009 Document Revised: 02/17/2016 Document Reviewed: 10/16/2014 Elsevier Interactive Patient Education  2017 [employer].

## 2017-03-14 ENCOUNTER — Ambulatory Visit (INDEPENDENT_AMBULATORY_CARE_PROVIDER_SITE_OTHER): Payer: Medicare Other | Admitting: *Deleted

## 2017-03-14 ENCOUNTER — Encounter (HOSPITAL_BASED_OUTPATIENT_CLINIC_OR_DEPARTMENT_OTHER): Payer: Medicare Other

## 2017-03-14 ENCOUNTER — Encounter (HOSPITAL_COMMUNITY): Payer: Medicare Other | Attending: Oncology

## 2017-03-14 ENCOUNTER — Encounter (HOSPITAL_COMMUNITY): Payer: Self-pay

## 2017-03-14 VITALS — BP 126/57 | HR 70 | Temp 98.0°F | Resp 18

## 2017-03-14 DIAGNOSIS — E611 Iron deficiency: Secondary | ICD-10-CM | POA: Insufficient documentation

## 2017-03-14 DIAGNOSIS — N184 Chronic kidney disease, stage 4 (severe): Secondary | ICD-10-CM | POA: Diagnosis present

## 2017-03-14 DIAGNOSIS — D649 Anemia, unspecified: Secondary | ICD-10-CM

## 2017-03-14 DIAGNOSIS — D631 Anemia in chronic kidney disease: Secondary | ICD-10-CM

## 2017-03-14 DIAGNOSIS — I255 Ischemic cardiomyopathy: Secondary | ICD-10-CM

## 2017-03-14 DIAGNOSIS — Z5181 Encounter for therapeutic drug level monitoring: Secondary | ICD-10-CM | POA: Diagnosis not present

## 2017-03-14 DIAGNOSIS — Z952 Presence of prosthetic heart valve: Secondary | ICD-10-CM

## 2017-03-14 LAB — CBC WITH DIFFERENTIAL/PLATELET
Basophils Absolute: 0 10*3/uL (ref 0.0–0.1)
Basophils Relative: 0 %
Eosinophils Absolute: 0 10*3/uL (ref 0.0–0.7)
Eosinophils Relative: 0 %
HCT: 34.2 % — ABNORMAL LOW (ref 36.0–46.0)
Hemoglobin: 10.8 g/dL — ABNORMAL LOW (ref 12.0–15.0)
Lymphocytes Relative: 15 %
Lymphs Abs: 1 10*3/uL (ref 0.7–4.0)
MCH: 29.8 pg (ref 26.0–34.0)
MCHC: 31.6 g/dL (ref 30.0–36.0)
MCV: 94.2 fL (ref 78.0–100.0)
Monocytes Absolute: 0.5 10*3/uL (ref 0.1–1.0)
Monocytes Relative: 7 %
Neutro Abs: 5.5 10*3/uL (ref 1.7–7.7)
Neutrophils Relative %: 78 %
Platelets: 182 10*3/uL (ref 150–400)
RBC: 3.63 MIL/uL — ABNORMAL LOW (ref 3.87–5.11)
RDW: 16.6 % — ABNORMAL HIGH (ref 11.5–15.5)
WBC: 7 10*3/uL (ref 4.0–10.5)

## 2017-03-14 LAB — FERRITIN: Ferritin: 400 ng/mL — ABNORMAL HIGH (ref 11–307)

## 2017-03-14 LAB — POCT INR: INR: 3

## 2017-03-14 MED ORDER — EPOETIN ALFA 40000 UNIT/ML IJ SOLN
40000.0000 [IU] | Freq: Once | INTRAMUSCULAR | Status: AC
  Administered 2017-03-14: 40000 [IU] via SUBCUTANEOUS
  Filled 2017-03-14: qty 1

## 2017-03-14 NOTE — Progress Notes (Signed)
[name] [name] tolerated Procrit injection well without complaints or incident. Hgb 10.8. VSS Pt discharged via wheelchair in satisfactory condition accompanied by family member

## 2017-03-14 NOTE — Patient Instructions (Signed)
[facility] at [facility] Discharge Instructions  RECOMMENDATIONS MADE BY THE CONSULTANT AND ANY TEST RESULTS WILL BE SENT TO YOUR REFERRING PHYSICIAN.  Received Procrit injection today. Follow-up as scheduled. Call clinic for any questions or concerns  Thank you for choosing [facility] at [facility] to provide your oncology and hematology care.  To afford each patient quality time with our provider, please arrive at least 15 minutes before your scheduled appointment time.    If you have a lab appointment with the Cancer Center please come in thru the  Main Entrance and check in at the main information desk  You need to re-schedule your appointment should you arrive 10 or more minutes late.  We strive to give you quality time with our providers, and arriving late affects you and other patients whose appointments are after yours.  Also, if you no show three or more times for appointments you may be dismissed from the clinic at the providers discretion.     Again, thank you for choosing [facility].  Our hope is that these requests will decrease the amount of time that you wait before being seen by our physicians.       _____________________________________________________________  Should you have questions after your visit to [facility], please contact our office at [phone] between the hours of 8:30 a.m. and 4:30 p.m.  Voicemails left after 4:30 p.m. will not be returned until the following business day.  For prescription refill requests, have your pharmacy contact our office.       Resources For Cancer Patients and their Caregivers ? American Cancer Society: Can assist with transportation, wigs, general needs, runs Look Good Feel Better.        1-888-227-6333 ? Cancer Care: Provides financial assistance, online support groups, medication/co-pay assistance.  1-800-813-HOPE (4673) ? [facility] Assists [place] cancer patients and their families through emotional , educational and financial support.  336-427-4357 ? [place] DSS Where to apply for food stamps, Medicaid and utility assistance. 336-342-1394 ? [facility]: Transportation to medical appointments. 336-347-2287 ? Social Security Administration: May apply for disability if have a Stage IV cancer. 336-342-7796 1-800-772-1213 ? [place] Aging, Disability and Transit Services: Assists with nutrition, care and transit needs. 336-349-2343  Cancer Center Support Programs: @10RELATIVEDAYS@ > Cancer Support Group  2nd Tuesday of the month 1pm-2pm, Journey Room  > Creative Journey  3rd Tuesday of the month 1130am-1pm, Journey Room  > Look Good Feel Better  1st Wednesday of the month 10am-12 noon, Journey Room (Call American Cancer Society to register 1-800-395-5775)   

## 2017-03-15 ENCOUNTER — Other Ambulatory Visit: Payer: Self-pay | Admitting: *Deleted

## 2017-03-19 ENCOUNTER — Ambulatory Visit: Payer: Medicare Other

## 2017-03-19 ENCOUNTER — Other Ambulatory Visit: Payer: Self-pay | Admitting: Licensed Clinical Social Worker

## 2017-03-19 ENCOUNTER — Telehealth: Payer: Self-pay | Admitting: Orthopaedic Surgery

## 2017-03-19 NOTE — Patient Outreach (Signed)
Assessment:  CSW spoke via phone with client. CSW verified client identity. CSW and client spoke of client needs.  Client sees Dr. [name] as primary care doctor. Client said she had her prescribed medications and is taking medications as prescribed.Client resides with her nephew who provides some support for client. Client uses a cane to assist her with ambulation. She said that family members transport client to and from client's scheduled medical appointments. Client has completed home health nursing services. Client has completed home health physical therapy services.  Client is now receiving [facility] nursing support with RN Joellyn Quails. Client said she is scheduled for eye surgery in July of 2018.  Client said she had an adequate food supply. CSW and client spoke of client care plan. CSW encouraged client to communicate with CSW in next 30 day to discuss community resources of assistance for client. Client said she is taking a prescribed pain medication. Client said she fatigues occasionally and has to take periodic rest breaks. She said she is sleeping more at present.  She is appreciative of support of [facility] RN Joellyn Quails.  CSW and client spoke of community resources of assistance for client in the area.  CSW spoke with client about local food pantries, about [employer] support, and about emergency assistance possibly for utility needs of client (possibly).  Client said she has some support from her nephew. She said she had no transport needs at present. She said she was debating whether to have eye surgery as scheduled in July of 2018. She has recently had appointment with eye specialist.  [place] thanked client for phone call with CSW on 03/19/17.  CSW encouraged client to call RN Joellyn Quails or CSW as needed to discuss current needs of client.   Plan:  Client to communicate with CSW in next 30 days to discuss community resources of assistance for client.   CSW to collaborate with RN Joellyn Quails in monitoring  needs of client.  CSW to call client in 4 weeks to assess client needs at that time.  [name].Rufina Kimery MSW, LCSW Licensed Clinical Social Worker [facility] Care Management [phone]

## 2017-03-19 NOTE — Telephone Encounter (Signed)
Patient requests a refill on Hydrocodone/Acetaminophen 7.5-325 mgs.   Qty  70  Sig: Take 1 tablet by mouth every 6 (six) hours as needed for moderate pain (Must last 30 days.Do not drive or operate machinery while taking this medicine.).

## 2017-03-20 MED ORDER — HYDROCODONE-ACETAMINOPHEN 7.5-325 MG PO TABS: 1.0000 | ORAL_TABLET | Freq: Four times a day (QID) | ORAL | 0 refills | Status: DC | PRN

## 2017-03-20 NOTE — Patient Outreach (Signed)
[facility]) Care Management   03/20/2017  [name] [name] Jul 22, 1932 [id number]  [name] [name] is an 83 y.o. female(widowed) with PMH of multiple valve replacement on coumadin, AICD, complete heart block, systolic CHF, COPD, DM, HTN, chronic kidney disease stage 1, constipation, DDD, cataract, glaucoma, gout, HDL, heart murmur, insomnia and thyroid cancer with thyroidectomy, Iron deficiency anemia.  [facility] CM referral received from [place] CM in April 2018 to continue to follow for [facility] and home visit for May 2018. She had been hospitalized 2/22-2/23/18 for exacerbation CHF and was previously active with [facility] RN health coach, [name] [name] [name] has not been hospitalized in the last 4 months and is doing well with home care management   Subjective: " I think I have over done it this week"  Objective:   BP 120/60   Pulse 75   Temp 98.6 F (37 C) (Oral)   Resp 20   Ht 1.702 m ('5\' 7"' )   Wt 219 lb (99.3 kg)   SpO2 96%   BMI 34.30 kg/m  Review of Systems  Constitutional: Negative for chills, diaphoresis, fever, malaise/fatigue and weight loss.  HENT: Negative.  Negative for congestion, ear discharge, ear pain, hearing loss, nosebleeds, sinus pain, sore throat and tinnitus.   Eyes: Negative.  Negative for blurred vision, double vision, photophobia, pain, discharge and redness.  Respiratory: Negative.  Negative for cough, hemoptysis, sputum production, shortness of breath, wheezing and stridor.   Cardiovascular: Positive for leg swelling. Negative for chest pain, palpitations, orthopnea, claudication and PND.  Gastrointestinal: Negative for abdominal pain, blood in stool, constipation, diarrhea, melena, nausea and vomiting.  Genitourinary: Negative.  Negative for dysuria, flank pain, frequency, hematuria and urgency.  Musculoskeletal: Positive for back pain and joint pain.  Skin: Negative.  Negative for itching and rash.  Neurological: Positive for weakness. Negative  for dizziness, tingling, tremors, sensory change, speech change, focal weakness, seizures, loss of consciousness and headaches.  Endo/Heme/Allergies: Negative.  Negative for environmental allergies and polydipsia. Does not bruise/bleed easily.  Psychiatric/Behavioral: Negative for depression, hallucinations, memory loss, substance abuse and suicidal ideas. The patient is not nervous/anxious and does not have insomnia.     Physical Exam  Constitutional: She is oriented to person, place, and time. She appears well-developed and well-nourished.  HENT:  Head: Normocephalic.  Eyes: Pupils are equal, round, and reactive to light.  Neck: Normal range of motion. Neck supple.  Cardiovascular: Normal rate, regular rhythm and normal heart sounds.   Respiratory: Effort normal and breath sounds normal.  GI: Soft.  Musculoskeletal: She exhibits edema.  Neurological: She is alert and oriented to person, place, and time.  Skin: Skin is warm and dry.  Psychiatric: She has a normal mood and affect. Her behavior is normal. Judgment and thought content normal.    Encounter Medications:   Outpatient Encounter Prescriptions as of 03/15/2017  Medication Sig Note  . acetaminophen (TYLENOL) 500 MG tablet Take 500 mg by mouth every 6 (six) hours as needed (pain).   Marland Kitchen albuterol (PROVENTIL) (2.5 MG/3ML) 0.083% nebulizer solution INHALE 1 VIAL VIA NEBULIZER THREE TIMES DAILY. (Patient taking differently: INHALE 1 VIAL VIA NEBULIZER THREE TIMES DAILY AS NEEDED)   . albuterol (VENTOLIN HFA) 108 (90 Base) MCG/ACT inhaler Inhale 2 puffs into the lungs every 6 (six) hours as needed for wheezing or shortness of breath.   . allopurinol (ZYLOPRIM) 100 MG tablet Take 1 tablet (100 mg total) by mouth daily.   Marland Kitchen azelastine (ASTELIN) 0.1 % nasal  spray Place 2 sprays into both nostrils 2 (two) times daily. Use in each nostril as directed 01/23/2017: uses as needed  . calcitRIOL (ROCALTROL) 0.25 MCG capsule Take 0.25 mcg by mouth  daily.    . cholecalciferol (VITAMIN D) 1000 UNITS tablet Take 1,000 Units by mouth daily.   Marland Kitchen COUMADIN 5 MG tablet Take 2 tablets daily (Patient taking differently: Take 10 mg by mouth every evening. Take 41m on Tuesday and take 163mon all other days) 10/31/2016: BRAND ONLY  . digoxin (LANOXIN) 0.125 MG tablet Take 1 tablet (0.125 mg total) by mouth every other day.   . docusate sodium (COLACE) 100 MG capsule Take 300 mg by mouth at bedtime.   . fluticasone (FLONASE) 50 MCG/ACT nasal spray USE 2 SPRAYS IN EACH       NOSTRIL ONCE DAILY (Patient taking differently: USE 2 SPRAYS IN EACH       NOSTRIL ONCE DAILY AS NEEDED FOR CONGESTION)   . folic acid (FOLVITE) 1 MG tablet Take 1 tablet (1 mg total) by mouth daily.   . furosemide (LASIX) 40 MG tablet Take 1 tablet by mouth  daily   . gabapentin (NEURONTIN) 300 MG capsule Take 1 capsule (300 mg total) by mouth 2 (two) times daily. (Patient taking differently: Take 300 mg by mouth at bedtime. )   . hydrALAZINE (APRESOLINE) 25 MG tablet Take 1 tablet (25 mg total) by mouth 3 (three) times daily.   . Marland KitchenYDROcodone-acetaminophen (NORCO) 7.5-325 MG tablet Take 1 tablet by mouth every 6 (six) hours as needed for moderate pain (Must last 30 days.Do not drive or operate machinery while taking this medicine.).   . Marland Kitchensosorbide mononitrate (IMDUR) 60 MG 24 hr tablet take 1 tablet by mouth once daily   . losartan (COZAAR) 50 MG tablet take 1 tablet by mouth once daily   . meclizine (ANTIVERT) 25 MG tablet Take 25 mg by mouth 3 (three) times daily as needed for dizziness.   . metolazone (ZAROXOLYN) 2.5 MG tablet Take 1 tablet (2.5 mg total) by mouth once a week. 01/23/2017: Get in mail order use only on wednesdays   . ondansetron (ZOFRAN) 4 MG tablet Take 1 tablet (4 mg total) by mouth daily as needed for nausea or vomiting.   . Vladimir Fasterlycol-Propyl Glycol (SYSTANE OP) Place 1 drop into both eyes daily as needed (dry eyes).   . potassium chloride SA (KLOR-CON M20)  20 MEQ tablet Take 1 tablet by mouth  daily   . pravastatin (PRAVACHOL) 20 MG tablet Take 1 tablet (20 mg total) by mouth daily.   . promethazine-dextromethorphan (PROMETHAZINE-DM) 6.25-15 MG/5ML syrup One teaspoon at bedtime, as needed, for excessive cough (Patient taking differently: Take 5 mLs by mouth at bedtime as needed for cough. as needed, for excessive cough)   . SYNTHROID 75 MCG tablet Take 1 tablet by mouth  daily   . TOPROL XL 100 MG 24 hr tablet Take 1 tablet (100 mg total) by mouth daily. Take with or immediately following a meal.    Facility-Administered Encounter Medications as of 03/15/2017  Medication  . epoetin alfa (EPOGEN,PROCRIT) injection 40,000 Units    Functional Status:   In your present state of health, do you have any difficulty performing the following activities: 03/15/2017 03/12/2017  Hearing? N N  Vision? N N  Difficulty concentrating or making decisions? N N  Walking or climbing stairs? N Y  Dressing or bathing? N N  Doing errands, shopping? N Aggie MoatsPrConservation officer, nature  and eating ? N N  Using the Toilet? N N  In the past six months, have you accidently leaked urine? N N  Do you have problems with loss of bowel control? N N  Managing your Medications? N N  Managing your Finances? N N  Housekeeping or managing your Housekeeping? N N  Some recent data might be hidden    Fall/Depression Screening:    Fall Risk  03/15/2017 03/12/2017 12/25/2016  Falls in the past year? No No No  Number falls in past yr: - - -  Injury with Fall? - - -  Risk for fall due to : Impaired mobility;Medication side effect;Impaired balance/gait Impaired balance/gait;Impaired mobility Impaired balance/gait;Medication side effect  Follow up - - -   PHQ 2/9 Scores 03/20/2017 03/15/2017 03/12/2017 11/30/2016 11/03/2016 10/13/2016 09/19/2016  PHQ - 2 Score 0 0 0 1 0 0 0  PHQ- 9 Score - - - - - - -    Assessment:   [name] [name] presents using her cane with oxygen intact She reports increased pain in  her joints today after having a busy week with appointments and errands She continues to manage her chronic medical issues with medications and diet.  Minimal activity completed related decreased mobility.  Today [name] [name] has rubbed medial cream on her hips, knees and ankles and has pain patches in various places on her body.  She was encouraged to rest today  She is able to voice home care interventions of DM, CHF, CHF zones and who to call for medical needs She had a medicare wellness visit at her pcp office and reports home care interventions discussed by [facility] CM were again reviewed   Plan:  [facility] Cm  Will follow up with [name] [name] in 1 month for home visit, [facility] discussed telephone assessment follow up but pt insisted she needed to be see Again [name] [name] discussed her friend whom THN CM confirmed is assigned another THN CM  [facility] CM Care Plan Problem One     Most Recent Value  Care Plan Problem One  Heart Failure knowledge deficit  Role Documenting the Problem One  Care Management Coordinator  Care Plan for Problem One  Active  [facility] Long Term Goal   Patient will become familiar with heart failure disease process and be able to verbalize heart failure zones for self management wtihin 90 days.    THN Long Term Goal Start Date  01/15/17 [goal continued]  [facility] Long Term Goal Met Date  03/15/17  Interventions for Problem One Long Term Goal  RN CM reinforced CHF action plan, importance of working with home health and continuing to do exercises, energy conservation  THN CM Short Term Goal #1   knowledge deficit related to low sodium diet  THN CM Short Term Goal #1 Start Date  01/15/17  [facility] CM Short Term Goal #1 Met Date  03/15/17  Interventions for Short Term Goal #1  RN CM reiterated foods high in sodium to limit, avoid, reviewed correlation to high sodium diet and fluid retention.  THN CM Short Term Goal #2   Decreased endurance  THN CM Short Term Goal #2 Start Date  12/25/16  [name] CM Short Term  Goal #2 Met Date  01/15/17  Interventions for Short Term Goal #2  RN CM ask pt to walk several times daily, short intervals throughout her house     [place] L. Lavina [name], RN, BSN, [place] Care Management [phone]

## 2017-03-20 NOTE — Patient Outreach (Signed)
[facility]) Care Management   5/31//2018  [name] [name] [birth year] [id number]  [name] [name] is an 83 y.o. female  (widowed) with PMH of multiple valve replacement on coumadin, AICD, complete heart block, systolic CHF, COPD, DM, HTN, chronic kidney disease stage 1, constipation, DDD, cataract, glaucoma, gout, HDL, heart murmur, insomnia and thyroid cancer with thyroidectomy, Iron deficiency anemia.  [facility] CM referral received from [place] CM in April 2018 to continue to follow for [facility] and home visit for May 2018. She had been hospitalized 2/22-2/23/18 for exacerbation CHF and was previously active with [facility] RN health coach, [name] [name] [name] has not been hospitalized in the last 4 months and is doing well with home care management  Subjective:  "I've been busy today donating clothes and cleaning up"  Objective:   BP 128/74   Pulse 68   Temp 98.1 F (36.7 C) (Oral)   Resp 20   Ht 1.702 m (_0 )   Wt 217 lb (98.4 kg)   SpO2 93%   BMI 33.99 kg/m  Review of Systems  Constitutional: Negative for chills, diaphoresis, fever, malaise/fatigue and weight loss.  HENT: Negative for congestion, ear discharge, ear pain, hearing loss, nosebleeds, sinus pain, sore throat and tinnitus.   Eyes: Negative for blurred vision, double vision, photophobia, pain, discharge and redness.  Respiratory: Negative for cough, hemoptysis, sputum production, shortness of breath, wheezing and stridor.   Cardiovascular: Positive for leg swelling. Negative for chest pain, palpitations, orthopnea, claudication and PND.  Gastrointestinal: Negative for abdominal pain, blood in stool, constipation, diarrhea, heartburn, melena, nausea and vomiting.  Genitourinary: Negative for dysuria, flank pain, frequency, hematuria and urgency.  Musculoskeletal: Positive for back pain and joint pain. Negative for falls, myalgias and neck pain.  Skin: Negative for itching and rash.  Neurological: Positive for  weakness. Negative for dizziness, tingling, tremors, sensory change, speech change, focal weakness, seizures, loss of consciousness and headaches.  Endo/Heme/Allergies: Negative for environmental allergies and polydipsia. Does not bruise/bleed easily.  Psychiatric/Behavioral: Negative for depression, hallucinations, substance abuse and suicidal ideas. The patient is not nervous/anxious and does not have insomnia.     Physical Exam  Constitutional: She is oriented to person, place, and time. She appears well-developed and well-nourished.  HENT:  Head: Normocephalic and atraumatic.  Right Ear: External ear normal.  Eyes: Pupils are equal, round, and reactive to light.  Neck: Normal range of motion.  Cardiovascular: Normal rate, regular rhythm and normal heart sounds.   Respiratory: Effort normal and breath sounds normal.  GI: Soft. Bowel sounds are normal.  Musculoskeletal: She exhibits edema and tenderness.  Neurological: She is alert and oriented to person, place, and time.  Skin: Skin is warm and dry.  Psychiatric: She has a normal mood and affect. Her behavior is normal. Judgment and thought content normal.    Encounter Medications:   Outpatient Encounter Prescriptions as of 02/22/2017  Medication Sig Note  . acetaminophen (TYLENOL) 500 MG tablet Take 500 mg by mouth every 6 (six) hours as needed (pain).   Marland Kitchen albuterol (VENTOLIN HFA) 108 (90 Base) MCG/ACT inhaler Inhale 2 puffs into the lungs every 6 (six) hours as needed for wheezing or shortness of breath.   . allopurinol (ZYLOPRIM) 100 MG tablet Take 1 tablet (100 mg total) by mouth daily.   Marland Kitchen azelastine (ASTELIN) 0.1 % nasal spray Place 2 sprays into both nostrils 2 (two) times daily. Use in each nostril as directed 01/23/2017: uses as needed  . calcitRIOL (  ROCALTROL) 0.25 MCG capsule Take 0.25 mcg by mouth daily.    . cholecalciferol (VITAMIN D) 1000 UNITS tablet Take 1,000 Units by mouth daily.   Marland Kitchen COUMADIN 5 MG tablet Take 2  tablets daily (Patient taking differently: Take 10 mg by mouth every evening. Take 19m on Tuesday and take 174mon all other days) 10/31/2016: BRAND ONLY  . docusate sodium (COLACE) 100 MG capsule Take 300 mg by mouth at bedtime.   . fluticasone (FLONASE) 50 MCG/ACT nasal spray USE 2 SPRAYS IN EACH       NOSTRIL ONCE DAILY (Patient taking differently: USE 2 SPRAYS IN EACH       NOSTRIL ONCE DAILY AS NEEDED FOR CONGESTION)   . folic acid (FOLVITE) 1 MG tablet Take 1 tablet (1 mg total) by mouth daily.   . furosemide (LASIX) 40 MG tablet Take 1 tablet by mouth  daily   . gabapentin (NEURONTIN) 300 MG capsule Take 1 capsule (300 mg total) by mouth 2 (two) times daily. (Patient taking differently: Take 300 mg by mouth at bedtime. )   . hydrALAZINE (APRESOLINE) 25 MG tablet Take 1 tablet (25 mg total) by mouth 3 (three) times daily.   . Marland KitchenYDROcodone-acetaminophen (NORCO) 7.5-325 MG tablet Take 1 tablet by mouth every 6 (six) hours as needed for moderate pain (Must last 30 days.Do not drive or operate machinery while taking this medicine.).   . Marland Kitchensosorbide mononitrate (IMDUR) 60 MG 24 hr tablet take 1 tablet by mouth once daily   . losartan (COZAAR) 50 MG tablet take 1 tablet by mouth once daily   . metolazone (ZAROXOLYN) 2.5 MG tablet Take 1 tablet (2.5 mg total) by mouth once a week. 01/23/2017: Get in mail order use only on wednesdays   . ondansetron (ZOFRAN) 4 MG tablet Take 1 tablet (4 mg total) by mouth daily as needed for nausea or vomiting.   . Vladimir Fasterlycol-Propyl Glycol (SYSTANE OP) Place 1 drop into both eyes daily as needed (dry eyes).   . pravastatin (PRAVACHOL) 20 MG tablet Take 1 tablet (20 mg total) by mouth daily.   . promethazine-dextromethorphan (PROMETHAZINE-DM) 6.25-15 MG/5ML syrup One teaspoon at bedtime, as needed, for excessive cough (Patient taking differently: Take 5 mLs by mouth at bedtime as needed for cough. as needed, for excessive cough)   . SYNTHROID 75 MCG tablet Take 1  tablet by mouth  daily   . TOPROL XL 100 MG 24 hr tablet Take 1 tablet (100 mg total) by mouth daily. Take with or immediately following a meal.   . [DISCONTINUED] digoxin (LANOXIN) 0.125 MG tablet Take 1 tablet (0.125 mg total) by mouth every other day. 10/31/2016: BRAND ONLY  . [DISCONTINUED] potassium chloride SA (KLOR-CON M20) 20 MEQ tablet Take 1 tablet by mouth  daily (Patient taking differently: Take 20 mEq by mouth daily. Take 1 tablet by mouth  daily)   . albuterol (PROVENTIL) (2.5 MG/3ML) 0.083% nebulizer solution INHALE 1 VIAL VIA NEBULIZER THREE TIMES DAILY. (Patient taking differently: INHALE 1 VIAL VIA NEBULIZER THREE TIMES DAILY AS NEEDED)    Facility-Administered Encounter Medications as of 02/22/2017  Medication  . epoetin alfa (EPOGEN,PROCRIT) injection 40,000 Units    Functional Status:   In your present state of health, do you have any difficulty performing the following activities: 03/15/2017 03/12/2017  Hearing? N N  Vision? N N  Difficulty concentrating or making decisions? N N  Walking or climbing stairs? N Y  Dressing or bathing? N N  Doing errands,  shopping? N Y  Conservation officer, nature and eating ? N N  Using the Toilet? N N  In the past six months, have you accidently leaked urine? N N  Do you have problems with loss of bowel control? N N  Managing your Medications? N N  Managing your Finances? N N  Housekeeping or managing your Housekeeping? N N  Some recent data might be hidden    Fall/Depression Screening:    Fall Risk  03/15/2017 03/12/2017 12/25/2016  Falls in the past year? No No No  Number falls in past yr: - - -  Injury with Fall? - - -  Risk for fall due to : Impaired mobility;Medication side effect;Impaired balance/gait Impaired balance/gait;Impaired mobility Impaired balance/gait;Medication side effect  Follow up - - -   PHQ 2/9 Scores 03/20/2017 03/15/2017 03/12/2017 11/30/2016 11/03/2016 10/13/2016 09/19/2016  PHQ - 2 Score 0 0 0 1 0 0 0  PHQ- 9 Score - - - - - -  -    Assessment:   [facility]N CM met [name] [name] in her nephew's home in the living room She is using her cane to ambulate She reports abdominal pain relate to "gas" but has Tums vs Gasx  [name] [name] voiced concern about not being able to get her Proventil inhaler from [facility] aid  [facility] CM assisted by speaking with [facility] staff to find that they do not have the inhaler in stock but can call it in to [facility] who does have one in stock and pt will be able to have family pick up her Proventil on 02/23/17  Reviewed Bp ranges for patients with DM (<130/80)and without DM (<140/90) [name] [name] discussed with [facility] CM that she is interested in changing pain management provider to a provider in [place] but is not ready for [facility] CM to assist her with this change.  [facility]N CM discussed the process of getting a referral from her pcp or checking with her insurance carrier's customer service number to find an in network pain management provider. [name] [name] voiced understanding and states she will request the assistance of her niece to complete the process when she is ready to change pain management providers   Plan:  [facility] reviewed her upcoming appointments including [facility] CM home visit appointment  [facility] CM offered to met [name] [name] at her pcp medicare wellness appointment but she refused She voiced not understanding the need of the pcp medicare wellness RN visit therefore [facility] CM assisted with answering questions and called PCP office to have pcp staff explain the medicare wellness visit E-mail was sent to K Hamser, LPN and requested she return a call to [name] [name] to answer further questions  [facility] CM Care Plan Problem One     Most Recent Value  Care Plan Problem One  Heart Failure knowledge deficit  Role Documenting the Problem One  Care Management Coordinator  Care Plan for Problem One  Active  [facility] Long Term Goal   Patient will become familiar with heart failure disease process and be able to verbalize heart failure zones  for self management wtihin 90 days.    [facility]N Long Term Goal Start Date  01/15/17 [goal continued]  Interventions for Problem One Long Term Goal  RN CM reinforced CHF action plan, importance of working with home health and continuing to do exercises, energy conservation  [facility] CM Short Term Goal #1   knowledge deficit related to low sodium diet  [facility]N CM Short Term Goal #1 Start Date  01/15/17  Interventions for Short Term Goal #1  RN CM reiterated foods high in sodium to limit, avoid, reviewed correlation to high sodium diet and fluid retention.  [facility]N CM Short Term Goal #2   Decreased endurance  [facility]N CM Short Term Goal #2 Start Date  12/25/16  [facility] CM Short Term Goal #2 Met Date  01/15/17  Interventions for Short Term Goal #2  RN CM ask pt to walk several times daily, short intervals throughout her house     [place] L. Lavina [name], RN, BSN, [place] Care Management [phone]

## 2017-03-21 ENCOUNTER — Telehealth: Payer: Self-pay

## 2017-03-21 NOTE — Telephone Encounter (Signed)
-----   Message from Stann Ore sent at 03/21/2017  2:47 PM EDT ----- Regarding: Lenard Lance,   Albina Billet sent me a referral for this patient to assist with getting a walker with a seat. I spoke with the patient and she reported that she was told Medicare wouldn't cover it. She reported that she has a hard time ambulating around the home and has to use a wheel chair for outings. Would it be possible to see if Dr Meda Coffee has written her in the past or would write her a prescription for a walker and send over to [facility].   Is it ok to send these types of questions and requests via inbasket?   Thanks,  [place]  [phone]

## 2017-03-22 ENCOUNTER — Ambulatory Visit: Payer: Medicare Other

## 2017-03-22 MED ORDER — UNABLE TO FIND: 0 refills | Status: DC

## 2017-03-22 NOTE — Addendum Note (Signed)
Addended by: Eual Fines on: 03/22/2017 12:25 PM   Modules accepted: Orders

## 2017-03-27 ENCOUNTER — Ambulatory Visit
Admission: RE | Admit: 2017-03-27 | Discharge: 2017-03-27 | Disposition: A | Discharge status: Home / Self Care | Payer: Medicare Other | Source: Ambulatory Visit | Attending: Ophthalmology | Admitting: Ophthalmology

## 2017-03-27 ENCOUNTER — Ambulatory Visit (INDEPENDENT_AMBULATORY_CARE_PROVIDER_SITE_OTHER): Payer: Medicare Other | Admitting: *Deleted

## 2017-03-27 DIAGNOSIS — I255 Ischemic cardiomyopathy: Secondary | ICD-10-CM

## 2017-03-27 DIAGNOSIS — H468 Other optic neuritis: Secondary | ICD-10-CM

## 2017-03-27 DIAGNOSIS — H0520 Unspecified exophthalmos: Secondary | ICD-10-CM

## 2017-03-27 DIAGNOSIS — I5022 Chronic systolic (congestive) heart failure: Secondary | ICD-10-CM

## 2017-03-27 DIAGNOSIS — R42 Dizziness and giddiness: Secondary | ICD-10-CM | POA: Diagnosis not present

## 2017-03-27 NOTE — Progress Notes (Signed)
Remote ICD transmission.   

## 2017-03-29 ENCOUNTER — Telehealth: Payer: Self-pay

## 2017-03-29 ENCOUNTER — Telehealth: Payer: Self-pay | Admitting: *Deleted

## 2017-03-29 ENCOUNTER — Other Ambulatory Visit: Payer: Self-pay | Admitting: *Deleted

## 2017-03-29 LAB — CUP PACEART REMOTE DEVICE CHECK
Battery Remaining Longevity: 16 mo
Battery Voltage: 2.92 V
Brady Statistic AP VP Percent: 26.75 %
Brady Statistic AP VS Percent: 0 %
Brady Statistic AS VP Percent: 72.58 %
Brady Statistic AS VS Percent: 0.67 %
Brady Statistic RA Percent Paced: 22.55 %
Brady Statistic RV Percent Paced: 99.26 %
Date Time Interrogation Session: 20180703062603
HighPow Impedance: 36 Ohm
HighPow Impedance: 47 Ohm
Implantable Lead Implant Date: 20040521
Implantable Lead Implant Date: 20071218
Implantable Lead Implant Date: 20160516
Implantable Lead Location: 753858
Implantable Lead Location: 753859
Implantable Lead Location: 753860
Implantable Lead Model: 5076
Implantable Lead Model: 6947
Implantable Pulse Generator Implant Date: 20160516
Lead Channel Impedance Value: 304 Ohm
Lead Channel Impedance Value: 342 Ohm
Lead Channel Impedance Value: 361 Ohm
Lead Channel Impedance Value: 361 Ohm
Lead Channel Impedance Value: 399 Ohm
Lead Channel Impedance Value: 456 Ohm
Lead Channel Impedance Value: 475 Ohm
Lead Channel Impedance Value: 608 Ohm
Lead Channel Impedance Value: 646 Ohm
Lead Channel Impedance Value: 703 Ohm
Lead Channel Impedance Value: 722 Ohm
Lead Channel Impedance Value: 722 Ohm
Lead Channel Impedance Value: 779 Ohm
Lead Channel Pacing Threshold Amplitude: 0.5 V
Lead Channel Pacing Threshold Amplitude: 1.375 V
Lead Channel Pacing Threshold Amplitude: 5 V
Lead Channel Pacing Threshold Pulse Width: 0.4 ms
Lead Channel Pacing Threshold Pulse Width: 0.4 ms
Lead Channel Pacing Threshold Pulse Width: 1.5 ms
Lead Channel Sensing Intrinsic Amplitude: 10.5 mV
Lead Channel Sensing Intrinsic Amplitude: 10.5 mV
Lead Channel Sensing Intrinsic Amplitude: 2.25 mV
Lead Channel Sensing Intrinsic Amplitude: 2.25 mV
Lead Channel Setting Pacing Amplitude: 2 V
Lead Channel Setting Pacing Amplitude: 2 V
Lead Channel Setting Pacing Amplitude: 2.5 V
Lead Channel Setting Pacing Pulse Width: 0.6 ms
Lead Channel Setting Pacing Pulse Width: 1 ms
Lead Channel Setting Sensing Sensitivity: 0.3 mV

## 2017-03-29 NOTE — Telephone Encounter (Signed)
Informed patient that she is currently in AF per her remote from 7/3. Patient denies any increased ShOB or fatigue. She states that she has noticed some occasional palpitations lately. Patient asked for an appt with Dr.Taylor to discuss her AF. Will defer scheduling to [employer].
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
## 2017-03-29 NOTE — Telephone Encounter (Signed)
-----   Message from Stann Ore sent at 03/21/2017  2:47 PM EDT ----- Regarding: [name] [name],   Albina Billet sent me a referral for this patient to assist with getting a walker with a seat. I spoke with the patient and she reported that she was told Medicare wouldn't cover it. She reported that she has a hard time ambulating around the home and has to use a wheel chair for outings. Would it be possible to see if Dr Meda Coffee has written her in the past or would write her a prescription for a walker and send over to [facility].   Is it ok to send these types of questions and requests via inbasket?   Thanks,  [place]  [phone]

## 2017-03-30 ENCOUNTER — Encounter: Payer: Self-pay | Admitting: Cardiology

## 2017-03-30 NOTE — Telephone Encounter (Signed)
Done

## 2017-03-30 NOTE — Telephone Encounter (Signed)
-----   Message from Stann Ore sent at 03/21/2017  2:47 PM EDT ----- Regarding: Lenard Lance,   Albina Billet sent me a referral for this patient to assist with getting a walker with a seat. I spoke with the patient and she reported that she was told Medicare wouldn't cover it. She reported that she has a hard time ambulating around the home and has to use a wheel chair for outings. Would it be possible to see if Dr Meda Coffee has written her in the past or would write her a prescription for a walker and send over to [facility].   Is it ok to send these types of questions and requests via inbasket?   Thanks,  [place]  [phone]

## 2017-03-30 NOTE — Telephone Encounter (Signed)
-----   Message from Stann Ore sent at 03/21/2017  2:47 PM EDT ----- Regarding: Lenard Lance,   Albina Billet sent me a referral for this patient to assist with getting a walker with a seat. I spoke with the patient and she reported that she was told Medicare wouldn't cover it. She reported that she has a hard time ambulating around the home and has to use a wheel chair for outings. Would it be possible to see if Dr Meda Coffee has written her in the past or would write her a prescription for a walker and send over to [facility].   Is it ok to send these types of questions and requests via inbasket?   Thanks,  [place]  [phone]

## 2017-04-02 ENCOUNTER — Other Ambulatory Visit: Payer: Self-pay | Admitting: Pharmacist

## 2017-04-02 NOTE — Patient Outreach (Signed)
[name] [name] was referred to pharmacy for medication assistance. Left a HIPAA compliant message on the patient's voicemail. If have not heard from patient by 04/04/17, will give her another call at that time.  Harlow Asa, PharmD, [place] [facility] [phone]

## 2017-04-04 ENCOUNTER — Other Ambulatory Visit: Payer: Self-pay | Admitting: Pharmacist

## 2017-04-04 ENCOUNTER — Encounter (HOSPITAL_COMMUNITY): Payer: Medicare Other | Attending: Oncology

## 2017-04-04 ENCOUNTER — Encounter (HOSPITAL_COMMUNITY): Payer: Medicare Other

## 2017-04-04 DIAGNOSIS — E611 Iron deficiency: Secondary | ICD-10-CM | POA: Insufficient documentation

## 2017-04-04 DIAGNOSIS — N184 Chronic kidney disease, stage 4 (severe): Secondary | ICD-10-CM | POA: Diagnosis not present

## 2017-04-04 DIAGNOSIS — D631 Anemia in chronic kidney disease: Secondary | ICD-10-CM | POA: Diagnosis not present

## 2017-04-04 LAB — CBC WITH DIFFERENTIAL/PLATELET
Basophils Absolute: 0 10*3/uL (ref 0.0–0.1)
Basophils Relative: 0 %
Eosinophils Absolute: 0 10*3/uL (ref 0.0–0.7)
Eosinophils Relative: 0 %
HCT: 34.5 % — ABNORMAL LOW (ref 36.0–46.0)
Hemoglobin: 11.1 g/dL — ABNORMAL LOW (ref 12.0–15.0)
Lymphocytes Relative: 17 %
Lymphs Abs: 0.9 10*3/uL (ref 0.7–4.0)
MCH: 30 pg (ref 26.0–34.0)
MCHC: 32.2 g/dL (ref 30.0–36.0)
MCV: 93.2 fL (ref 78.0–100.0)
Monocytes Absolute: 0.4 10*3/uL (ref 0.1–1.0)
Monocytes Relative: 8 %
Neutro Abs: 4 10*3/uL (ref 1.7–7.7)
Neutrophils Relative %: 75 %
Platelets: 162 10*3/uL (ref 150–400)
RBC: 3.7 MIL/uL — ABNORMAL LOW (ref 3.87–5.11)
RDW: 16.5 % — ABNORMAL HIGH (ref 11.5–15.5)
WBC: 5.4 10*3/uL (ref 4.0–10.5)

## 2017-04-04 LAB — FERRITIN: Ferritin: 737 ng/mL — ABNORMAL HIGH (ref 11–307)

## 2017-04-04 NOTE — Patient Outreach (Addendum)
[name] [name] was referred to pharmacy for medication assistance. Called and spoke with patient. HIPAA identifiers verified and verbal consent received.  [name] [name] reports that her copayment for her Toprol XL and Synthroid is expensive, $80-$90 for each. Patient reports that she takes the brand name of each of these medications because she prefers to take brand name. Discuss with patient the benefit of generic options for cost savings and how generics compare to brand name medication. Let patient know that even though Synthroid is a narrow therapeutic medication, each of these medications can be switched to generic for cost savings with her prescriber's approval. [name] [name] understanding, but states that she does not want to receive generic. Reports that she would prefer to continue to pay the higher copayment than switch.  Note that a patient assistance program is available for Synthroid through [employer]. Review the requirements for this application with [name] [name]. Patient reports that she is not sure that she will want to complete this application, but asks that I send it to her and that she will then call me if she decided to move forward with it.  Patient denies any further medication questions/concerns.  PLAN:  1) Will send [name] [name] the patient assistance application for Synthroid.  2) Will close pharmacy episode at this time.   Harlow Asa, PharmD, [place] Management [phone]

## 2017-04-04 NOTE — Progress Notes (Signed)
Patient's Hgb 11.1 today, treatment held.

## 2017-04-09 ENCOUNTER — Other Ambulatory Visit: Payer: Self-pay | Admitting: Family Medicine

## 2017-04-09 DIAGNOSIS — Z1231 Encounter for screening mammogram for malignant neoplasm of breast: Secondary | ICD-10-CM

## 2017-04-11 ENCOUNTER — Ambulatory Visit (INDEPENDENT_AMBULATORY_CARE_PROVIDER_SITE_OTHER): Payer: Medicare Other | Admitting: *Deleted

## 2017-04-11 DIAGNOSIS — I255 Ischemic cardiomyopathy: Secondary | ICD-10-CM | POA: Diagnosis not present

## 2017-04-11 DIAGNOSIS — Z952 Presence of prosthetic heart valve: Secondary | ICD-10-CM | POA: Diagnosis not present

## 2017-04-11 DIAGNOSIS — Z5181 Encounter for therapeutic drug level monitoring: Secondary | ICD-10-CM | POA: Diagnosis not present

## 2017-04-11 LAB — POCT INR: INR: 3

## 2017-04-13 ENCOUNTER — Telehealth: Payer: Self-pay | Admitting: Family Medicine

## 2017-04-13 ENCOUNTER — Telehealth: Payer: Self-pay | Admitting: Emergency Medicine

## 2017-04-13 NOTE — Telephone Encounter (Signed)
Patient left message on voice mail 04-12-17 @ 9:55 giving her name and call back #.  No detail. Message taken today by Bubba Hales addressing her voice mail message left yesterday.     Pt's cb  [phone]

## 2017-04-13 NOTE — Telephone Encounter (Deleted)
Patient called and left message on voice mail  04-12-17 @ 9:55.  No detail.  Additional message noted today taken by [facility].    Pt's cb  [phone]

## 2017-04-13 NOTE — Telephone Encounter (Signed)
States she is having bad nerve pain in her legs. Thinks its coming from her back. Luna Glasgow is treating her with hydrocodone but she said its not helping her leg pain. States Dr [name] prescribes her gabapentin and she wants to know if there is anything stronger than that for the leg pain. Please advise

## 2017-04-13 NOTE — Telephone Encounter (Signed)
Patient called, states she called yesterday and no one ever called her back. She states that she is sore all over and her legs are hurting real back .She states she needs an antibiotic. I asked her to clarify, she states she is hurting all over and her legs are hurting and she needs an antibiotic. She is not having other symptoms. Wants to speak to [facility]. I informed her that Dr [name] was out until Monday.

## 2017-04-15 NOTE — Telephone Encounter (Signed)
I note that she is taking gabapentin 300 mg one  At night , though prescribed one twice daily. I recommend she start taking the gabapentin 300 mg one twice daily as prescribed, see if this helps. Let her know if the gabapentin makes her feel excessively sleepy or dizzy needs to cut back to the one only at night , call back and let me know

## 2017-04-16 ENCOUNTER — Telehealth: Payer: Self-pay | Admitting: Orthopaedic Surgery

## 2017-04-16 ENCOUNTER — Other Ambulatory Visit: Payer: Self-pay | Admitting: Family Medicine

## 2017-04-16 MED ORDER — GABAPENTIN 100 MG PO CAPS: ORAL_CAPSULE | ORAL | 5 refills | Status: DC

## 2017-04-16 MED ORDER — GABAPENTIN 300 MG PO CAPS: 300.0000 mg | ORAL_CAPSULE | Freq: Every day | ORAL | 3 refills | Status: DC

## 2017-04-16 MED ORDER — HYDROCODONE-ACETAMINOPHEN 7.5-325 MG PO TABS: 1.0000 | ORAL_TABLET | Freq: Four times a day (QID) | ORAL | 0 refills | Status: DC | PRN

## 2017-04-16 NOTE — Progress Notes (Signed)
ga

## 2017-04-16 NOTE — Telephone Encounter (Signed)
I spoke directly with the pt, gabapentin is a capsule so she cannot break it, she confirms that she has not been breaking it. She has been taking gaabpentin 300 mg one at bedtime only, will add 100 mg one daily, she is on hydrocodone regularly from  orthopedics

## 2017-04-16 NOTE — Telephone Encounter (Signed)
Hydrocodone-Acetaminophen  7.5/325 mg   Qty 60 Tablets

## 2017-04-16 NOTE — Telephone Encounter (Signed)
Patient states she is already taking metformin 2 1/2 tablets a day

## 2017-04-18 DIAGNOSIS — H0520 Unspecified exophthalmos: Secondary | ICD-10-CM | POA: Diagnosis not present

## 2017-04-19 ENCOUNTER — Other Ambulatory Visit: Payer: Self-pay | Admitting: Licensed Clinical Social Worker

## 2017-04-19 NOTE — Patient Outreach (Signed)
Assessment:  CSW spoke via phone with client  CSW verified client identity.  CSW and client spoke of client needs. Client sees Dr. [name] as primary care doctor. Client has prescribed medications and is taking medications as prescribed.  Client resides with her nephew who provides some support. Client uses a cane to assist her in ambulation. She said that family members transport client to and from client's scheduled medical appointments.  Client has completed home health nursing services and has completed home health physical therapy services. Client is now receiving [facility] nursing support with RN Joellyn Quails. Client said she has an adequate food supply. CSW and client spoke of client care plan. CSW encoruaged clienet to communicate with CSW in next 30 days to discuss community resoruces of assistance for client. Client said she fatigues easily and has to take periodic rest breaks. Client said she takes a prescribed pain medication. CSW has previously talked with client about resources at [employer] pantries and about possible resources at [employer]. Client said she uses oxygen as prescribed at night to help her with breathing.  Client uses nebulizer as prescribed.  Client also uses inhaler as prescribed. Client said her niece takes client shopping for food periodically. Client said she had appointment with eye doctor in [place], [place] yesterday. Client said she appreciates [facility] support with RN Joellyn Quails. Client said she is sleeping adequately.  Client said she sees cardiologist for appointment this coming Monday. Client said she has another appointment with eye doctor in [place], [place] in August of 2018. Client said that her niece could help client if client needed transport to go to local food pantry or community agency. CSW thanked client for phone call with CSW on 04/19/17. CSW encouraged client to call CSW at [phone] as needed to discuss social work needs of  client.    Plan:  Client to communicate with CSW in next 30 days to discuss community resources of assistance for client.  CSW to collaborate as needed with RN Joellyn Quails in monitoring needs of client   CSW to call client in 4 weeks to assess client needs at that time.   [name].Philo Kurtz MSW, LCSW Licensed Clinical Social Worker [facility] Care Management [phone]

## 2017-04-23 ENCOUNTER — Encounter: Payer: Self-pay | Admitting: Internal Medicine

## 2017-04-23 ENCOUNTER — Ambulatory Visit (INDEPENDENT_AMBULATORY_CARE_PROVIDER_SITE_OTHER): Payer: Medicare Other | Admitting: Internal Medicine

## 2017-04-23 VITALS — BP 134/70 | HR 90 | Ht 67.0 in | Wt 225.0 lb

## 2017-04-23 DIAGNOSIS — I255 Ischemic cardiomyopathy: Secondary | ICD-10-CM | POA: Diagnosis not present

## 2017-04-23 DIAGNOSIS — I442 Atrioventricular block, complete: Secondary | ICD-10-CM | POA: Diagnosis not present

## 2017-04-23 LAB — CUP PACEART INCLINIC DEVICE CHECK
Battery Remaining Longevity: 16 mo
Battery Voltage: 2.92 V
Brady Statistic AP VP Percent: 16.77 %
Brady Statistic AP VS Percent: 0 %
Brady Statistic AS VP Percent: 82.39 %
Brady Statistic AS VS Percent: 0.84 %
Brady Statistic RA Percent Paced: 13.78 %
Brady Statistic RV Percent Paced: 99.21 %
Date Time Interrogation Session: 20180730104445
HighPow Impedance: 37 Ohm
HighPow Impedance: 47 Ohm
Implantable Lead Implant Date: 20040521
Implantable Lead Implant Date: 20071218
Implantable Lead Implant Date: 20160516
Implantable Lead Location: 753858
Implantable Lead Location: 753859
Implantable Lead Location: 753860
Implantable Lead Model: 5076
Implantable Lead Model: 6947
Implantable Pulse Generator Implant Date: 20160516
Lead Channel Impedance Value: 285 Ohm
Lead Channel Impedance Value: 342 Ohm
Lead Channel Impedance Value: 342 Ohm
Lead Channel Impedance Value: 361 Ohm
Lead Channel Impedance Value: 399 Ohm
Lead Channel Impedance Value: 456 Ohm
Lead Channel Impedance Value: 513 Ohm
Lead Channel Impedance Value: 589 Ohm
Lead Channel Impedance Value: 608 Ohm
Lead Channel Impedance Value: 703 Ohm
Lead Channel Impedance Value: 760 Ohm
Lead Channel Impedance Value: 836 Ohm
Lead Channel Impedance Value: 836 Ohm
Lead Channel Pacing Threshold Amplitude: 0.5 V
Lead Channel Pacing Threshold Amplitude: 1.375 V
Lead Channel Pacing Threshold Amplitude: 5 V
Lead Channel Pacing Threshold Pulse Width: 0.4 ms
Lead Channel Pacing Threshold Pulse Width: 0.4 ms
Lead Channel Pacing Threshold Pulse Width: 1.5 ms
Lead Channel Sensing Intrinsic Amplitude: 10.5 mV
Lead Channel Sensing Intrinsic Amplitude: 10.5 mV
Lead Channel Sensing Intrinsic Amplitude: 2.25 mV
Lead Channel Sensing Intrinsic Amplitude: 3 mV
Lead Channel Setting Pacing Amplitude: 2 V
Lead Channel Setting Pacing Amplitude: 2.5 V
Lead Channel Setting Pacing Amplitude: 2.5 V
Lead Channel Setting Pacing Pulse Width: 0.6 ms
Lead Channel Setting Pacing Pulse Width: 1 ms
Lead Channel Setting Sensing Sensitivity: 0.3 mV

## 2017-04-23 NOTE — Patient Instructions (Signed)
Medication Instructions:  Your physician recommends that you continue on your current medications as directed. Please refer to the Current Medication list given to you today.   Labwork: NONE  Testing/Procedures: NONE  Follow-Up: Your physician wants you to follow-up in: 1 Year with Dr. Lovena Le. You will receive a reminder letter in the mail two months in advance. If you don't receive a letter, please call our office to schedule the follow-up appointment. Remote monitoring is used to monitor your Pacemaker of ICD from home. This monitoring reduces the number of office visits required to check your device to one time per year. It allows [place] to keep an eye on the functioning of your device to ensure it is working properly. You are scheduled for a device check from home on 07/23/17. You may send your transmission at any time that day. If you have a wireless device, the transmission will be sent automatically. After your physician reviews your transmission, you will receive a postcard with your next transmission date.    Any Other Special Instructions Will Be Listed Below (If Applicable).     If you need a refill on your cardiac medications before your next appointment, please call your pharmacy.  Thank you for choosing [facility]!

## 2017-04-23 NOTE — Progress Notes (Signed)
HPI [name] [name] returns today for followup. 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 Her old ICD lead had broken. In the interim, she has had some difficulty with her pain meds making her sleepy. She is trying to avoid them. She has not had any hospitalization for CHF since her last visit. No chest pain. She is tolerating her Warfarin. She has an allergy to ASA.   Current Outpatient Prescriptions  Medication Sig Dispense Refill  . acetaminophen (TYLENOL) 500 MG tablet Take 500 mg by mouth every 6 (six) hours as needed (pain).    Marland Kitchen albuterol (PROVENTIL) (2.5 MG/3ML) 0.083% nebulizer solution INHALE 1 VIAL VIA NEBULIZER THREE TIMES DAILY. (Patient taking differently: INHALE 1 VIAL VIA NEBULIZER THREE TIMES DAILY AS NEEDED) 90 vial 5  . albuterol (VENTOLIN HFA) 108 (90 Base) MCG/ACT inhaler Inhale 2 puffs into the lungs every 6 (six) hours as needed for wheezing or shortness of breath. 3 Inhaler 3  . allopurinol (ZYLOPRIM) 100 MG tablet Take 1 tablet (100 mg total) by mouth daily. 90 tablet 1  . azelastine (ASTELIN) 0.1 % nasal spray Place 2 sprays into both nostrils 2 (two) times daily. Use in each nostril as directed 30 mL 12  . calcitRIOL (ROCALTROL) 0.25 MCG capsule Take 0.25 mcg by mouth daily.   0  . cholecalciferol (VITAMIN D) 1000 UNITS tablet Take 1,000 Units by mouth daily.    Marland Kitchen COUMADIN 5 MG tablet Take 2 tablets daily (Patient taking differently: Take 10 mg by mouth every evening. Take 5mg  on Tuesday and take 10mg  on all other days) 10 tablet 1  . digoxin (LANOXIN) 0.125 MG tablet Take 1 tablet (0.125 mg total) by mouth every other day. 45 tablet 3  . docusate sodium (COLACE) 100 MG capsule Take 300 mg by mouth at bedtime.    . fluticasone (FLONASE) 50 MCG/ACT  nasal spray USE 2 SPRAYS IN EACH       NOSTRIL ONCE DAILY (Patient taking differently: USE 2 SPRAYS IN EACH       NOSTRIL ONCE DAILY AS NEEDED FOR CONGESTION) 48 g 1  . folic acid (FOLVITE) 1 MG tablet Take 1 tablet (1 mg total) by mouth daily. 100 tablet 3  . furosemide (LASIX) 40 MG tablet Take 1 tablet by mouth  daily 90 tablet 1  . gabapentin (NEURONTIN) 100 MG capsule One capsule every morning 30 capsule 5  . gabapentin (NEURONTIN) 300 MG capsule Take 1 capsule (300 mg total) by mouth at bedtime. 30 capsule 3  . hydrALAZINE (APRESOLINE) 25 MG tablet Take 1 tablet (25 mg total) by mouth 3 (three) times daily. 270 tablet 3  . HYDROcodone-acetaminophen (NORCO) 7.5-325 MG tablet Take 1 tablet by mouth every 6 (six) hours as needed for moderate pain (Must last 30 days.Do not drive or operate machinery while taking this medicine.). 55 tablet 0  . isosorbide mononitrate (IMDUR) 60 MG 24 hr tablet take 1 tablet by mouth once daily 90 tablet 1  . losartan (COZAAR) 50 MG tablet take 1 tablet by mouth once daily 90 tablet 1  . meclizine (ANTIVERT) 25 MG tablet Take 25 mg by mouth 3 (three) times daily as needed for dizziness.    . metolazone (ZAROXOLYN) 2.5 MG tablet Take 1  tablet (2.5 mg total) by mouth once a week. 12 tablet 2  . ondansetron (ZOFRAN) 4 MG tablet Take 1 tablet (4 mg total) by mouth daily as needed for nausea or vomiting. 90 tablet 0  . Polyethyl [name] (SYSTANE OP) Place 1 drop into both eyes daily as needed (dry eyes).    . potassium chloride SA (KLOR-CON M20) 20 MEQ tablet Take 1 tablet by mouth  daily 90 tablet 1  . pravastatin (PRAVACHOL) 20 MG tablet Take 1 tablet (20 mg total) by mouth daily. 90 tablet 1  . promethazine-dextromethorphan (PROMETHAZINE-DM) 6.25-15 MG/5ML syrup One teaspoon at bedtime, as needed, for excessive cough (Patient taking differently: Take 5 mLs by mouth at bedtime as needed for cough. as needed, for excessive cough) 180 mL 0  . SYNTHROID 75  MCG tablet Take 1 tablet by mouth  daily 90 tablet 1  . TOPROL XL 100 MG 24 hr tablet Take 1 tablet (100 mg total) by mouth daily. Take with or immediately following a meal. 90 tablet 1  . UNABLE TO FIND Walker with seat x 1 1 each 0   No current facility-administered medications for this visit.    Facility-Administered Medications Ordered in Other Visits  Medication Dose Route Frequency Provider Last Rate Last Dose  . epoetin alfa (EPOGEN,PROCRIT) injection 40,000 Units  40,000 Units Subcutaneous Once Farrel Gobble, MD         Past Medical History:  Diagnosis Date  . Adenomatous polyp of colon 12/16/2002   Dr. [name]/[facility]  . Allergy   . Cataract   . CHB (complete heart block) ([place]) 10/07/2014      . CHF (congestive heart failure) ([place])       . Chronic kidney disease, stage I 2006  . Constipation       . COPD (chronic obstructive pulmonary disease) ([place])       . DJD (degenerative joint disease) of lumbar spine   . DM (diabetes mellitus) ([place]) 2006  . Esophageal reflux   . Glaucoma   . Gout       . Heart murmur   . Hyperlipemia   . IDA (iron deficiency anemia)    Parenteral iron/[name]  . Insomnia       . Non-ischemic cardiomyopathy ([place])    a. s/p MDT CRTD  . Obesity, unspecified   . Other and unspecified hyperlipidemia   . Oxygen deficiency 2014   nocturnal;  . Spondylolisthesis   . Spondylosis   . Thyroid cancer ([place])    remote thyroidectomy, no recurrence, pt denies in 2016 thyroid cancer  . Unspecified hypothyroidism       . 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 Vertigo         ROS:   All systems reviewed and negative except as noted in the HPI.   Past Surgical History:  Procedure Laterality Date  . AORTIC AND MITRAL VALVE REPLACEMENT  2001  . BI-VENTRICULAR IMPLANTABLE CARDIOVERTER DEFIBRILLATOR UPGRADE N/A 01/18/2015   Procedure: BI-VENTRICULAR IMPLANTABLE CARDIOVERTER  DEFIBRILLATOR UPGRADE;  Surgeon: Evans Lance, MD;  Location: [facility] CATH LAB;  Service: Cardiovascular;  Laterality: N/A;  . CARDIAC CATHETERIZATION    . CARDIAC VALVE REPLACEMENT    . CARDIOVERSION N/A 11/13/2016   Procedure: CARDIOVERSION;  Surgeon: Sanda Klein, MD;  Location: [facility] ENDOSCOPY;  Service: Cardiovascular;  Laterality: N/A;  . COLONOSCOPY  06/12/2007   Rourk- Normal rectum/Normal colon  . COLONOSCOPY  12/16/02  small hemorrhoids  . COLONOSCOPY  06/20/2012   Procedure: COLONOSCOPY;  Surgeon: Daneil Dolin, MD;  Location: [facility] ENDO SUITE;  Service: Endoscopy;  Laterality: N/A;  10:45  . defibrillator implanted 2006    . DOPPLER ECHOCARDIOGRAPHY  2012  . DOPPLER ECHOCARDIOGRAPHY  05,06,07,08,09,2011  . ESOPHAGOGASTRODUODENOSCOPY  06/12/2007   Rourk- normal esophagus, small hiatal hernia, otherwise normal stomach, D1, D2  . EYE SURGERY Right 2005  . EYE SURGERY Left 2006  . ICD LEAD REMOVAL Left 02/08/2015   Procedure: ICD LEAD REMOVAL;  Surgeon: Evans Lance, MD;  Location: [facility] OR;  Service: Cardiovascular;  Laterality: Left;  "Will plan extraction and insertion of a BiV PM"  **Dr. Roxan Hockey backing up case**  . IMPLANTABLE CARDIOVERTER DEFIBRILLATOR (ICD) GENERATOR CHANGE Left 02/08/2015   Procedure: ICD GENERATOR CHANGE;  Surgeon: Evans Lance, MD;  Location: [place];  Service: Cardiovascular;  Laterality: Left;  . INSERT / REPLACE / REMOVE PACEMAKER    . PACEMAKER INSERTION  May 2016  . pacemaker placed  2004  . right breast cyst removed     benign   . right cataract removed     2005  . TEE WITHOUT CARDIOVERSION N/A 11/13/2016   Procedure: TRANSESOPHAGEAL ECHOCARDIOGRAM (TEE);  Surgeon: Sanda Klein, MD;  Location: [facility] ENDOSCOPY;  Service: Cardiovascular;  Laterality: N/A;  . THYROIDECTOMY       Family History  Problem Relation Age of Onset  . Pancreatic cancer Mother   . Heart disease Father   . Heart disease Sister   . Heart attack Sister   . Heart disease  Brother   . Diabetes Brother   . Heart disease Brother   . Diabetes Brother   . Hypertension Brother   . Lung cancer Brother      Social History   Social History  . Marital status: Widowed    Spouse name: N/A  . Number of children: 0  . Years of education: N/A   Occupational History  . retired Retired   Social History Main Topics  . Smoking status: Former Smoker    Packs/day: 0.75    Years: 40.00    Types: Cigarettes    Quit date: 03/08/1987  . Smokeless tobacco: Never Used  . Alcohol use No  . Drug use: No  . Sexual activity: Not Currently   Other Topics Concern  . Not on file   Social History Narrative   From [place] (2004)     BP 134/70   Pulse 90   Ht 5\' 7"  (1.702 m)   Wt 225 lb (102.1 kg)   SpO2 93%   BMI 35.24 kg/m   Physical Exam:  stable appearing 83 yo man, NAD HEENT: Unremarkable Neck:  6 cm JVD, no thyromegally Back:  No CVA tenderness Lungs:  Clear with no wheezes HEART:  Regular rate rhythm, no murmurs, no rubs, no clicks Abd:  soft, positive bowel sounds, no organomegally, no rebound, no guarding Ext:  2 plus pulses, no edema, no cyanosis, no clubbing Skin:  No rashes no nodules Neuro:  CN II through XII intact, motor grossly intact  DEVICE  Normal device function.  See PaceArt for details.   Assess/Plan: 1. Chronic systolic heart failure - her symptoms are class 2. She will continue her current meds. I have encouraged her to increase her physical activity. She will maintain a low sodium diet. 2. HTN - her blood pressure is well controlled.  3. ICD - Her medtronic Biv device is working  normally. Will recheck in several months. We reprogrammed her LV output today as her threshold is up a bit. 4. Atrial fib - she has been out of rhythm for over a month but is asymptomatic. At this point I do not intend to try and pursue DCCV.  Mikle Bosworth.D.

## 2017-04-25 ENCOUNTER — Encounter (HOSPITAL_COMMUNITY): Payer: Medicare Other | Attending: Oncology

## 2017-04-25 ENCOUNTER — Encounter (HOSPITAL_COMMUNITY): Payer: Medicare Other

## 2017-04-25 DIAGNOSIS — N184 Chronic kidney disease, stage 4 (severe): Secondary | ICD-10-CM | POA: Diagnosis not present

## 2017-04-25 DIAGNOSIS — D631 Anemia in chronic kidney disease: Secondary | ICD-10-CM

## 2017-04-25 DIAGNOSIS — E611 Iron deficiency: Secondary | ICD-10-CM

## 2017-04-25 LAB — COMPREHENSIVE METABOLIC PANEL
ALT: 15 U/L (ref 14–54)
AST: 27 U/L (ref 15–41)
Albumin: 4.1 g/dL (ref 3.5–5.0)
Alkaline Phosphatase: 51 U/L (ref 38–126)
Anion gap: 10 (ref 5–15)
BUN: 46 mg/dL — ABNORMAL HIGH (ref 6–20)
CO2: 31 mmol/L (ref 22–32)
Calcium: 9.8 mg/dL (ref 8.9–10.3)
Chloride: 95 mmol/L — ABNORMAL LOW (ref 101–111)
Creatinine, Ser: 1.7 mg/dL — ABNORMAL HIGH (ref 0.44–1.00)
GFR calc Af Amer: 31 mL/min — ABNORMAL LOW (ref >60)
GFR calc non Af Amer: 27 mL/min — ABNORMAL LOW (ref >60)
Glucose, Bld: 120 mg/dL — ABNORMAL HIGH (ref 65–99)
Potassium: 4 mmol/L (ref 3.5–5.1)
Sodium: 136 mmol/L (ref 135–145)
Total Bilirubin: 0.8 mg/dL (ref 0.3–1.2)
Total Protein: 8.2 g/dL — ABNORMAL HIGH (ref 6.5–8.1)

## 2017-04-25 LAB — CBC WITH DIFFERENTIAL/PLATELET
Basophils Absolute: 0 10*3/uL (ref 0.0–0.1)
Basophils Relative: 0 %
Eosinophils Absolute: 0 10*3/uL (ref 0.0–0.7)
Eosinophils Relative: 0 %
HCT: 34.6 % — ABNORMAL LOW (ref 36.0–46.0)
Hemoglobin: 11.2 g/dL — ABNORMAL LOW (ref 12.0–15.0)
Lymphocytes Relative: 15 %
Lymphs Abs: 1.1 10*3/uL (ref 0.7–4.0)
MCH: 30 pg (ref 26.0–34.0)
MCHC: 32.4 g/dL (ref 30.0–36.0)
MCV: 92.8 fL (ref 78.0–100.0)
Monocytes Absolute: 0.5 10*3/uL (ref 0.1–1.0)
Monocytes Relative: 7 %
Neutro Abs: 5.7 10*3/uL (ref 1.7–7.7)
Neutrophils Relative %: 78 %
Platelets: 170 10*3/uL (ref 150–400)
RBC: 3.73 MIL/uL — ABNORMAL LOW (ref 3.87–5.11)
RDW: 16.5 % — ABNORMAL HIGH (ref 11.5–15.5)
WBC: 7.3 10*3/uL (ref 4.0–10.5)

## 2017-04-25 LAB — FERRITIN: Ferritin: 854 ng/mL — ABNORMAL HIGH (ref 11–307)

## 2017-04-25 NOTE — Progress Notes (Signed)
Procrit held today for hgb of 11.2. 

## 2017-04-26 ENCOUNTER — Other Ambulatory Visit: Payer: Self-pay | Admitting: *Deleted

## 2017-04-26 ENCOUNTER — Telehealth: Payer: Self-pay | Admitting: *Deleted

## 2017-04-26 NOTE — Telephone Encounter (Signed)
-----   Message from Barbaraann Faster, RN sent at 04/26/2017  3:47 PM EDT ----- Regarding: Bruising  [name] [name] and Dr Lovena Le I see [name] [name] for [facility] home visits  She states she is not informing you she is bruising and concerned about her coumadin levels are too high She has a bruise on her right thigh and left shin ?counadin use, levels high  [name] L. Lavina [name], RN, BSN, [place] [facility] [phone]

## 2017-04-26 NOTE — Telephone Encounter (Signed)
Called and spoke with pt.  States she has a large bruise on the inner aspect of her Rt leg.  Has been there over a week and is fading.  Dr Lovena Le saw it when pt saw him last week.  Also has a smaller bruise on inside of Lt leg.  She has not had any medicine changes that would interfere with her INR.  She is taking the same amt of pain meds.  Last INR was 3.0.  Range 2.5 - 3.5.  Pt is not concerned.  She has not had any bleeding or new bruises appear over the last week.  Offered pt an appt to come in for INR check but she prefers to wait for regular appt.  She will call if she develops increased bruising or bleeding or go to the ED if office is closed.
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
## 2017-04-29 ENCOUNTER — Other Ambulatory Visit: Payer: Self-pay | Admitting: Cardiovascular Disease

## 2017-04-30 ENCOUNTER — Telehealth: Payer: Self-pay | Admitting: *Deleted

## 2017-04-30 ENCOUNTER — Telehealth: Payer: Self-pay | Admitting: Family Medicine

## 2017-04-30 DIAGNOSIS — Z952 Presence of prosthetic heart valve: Secondary | ICD-10-CM

## 2017-04-30 DIAGNOSIS — Z5181 Encounter for therapeutic drug level monitoring: Secondary | ICD-10-CM

## 2017-04-30 NOTE — Telephone Encounter (Signed)
-----   Message from Evans Lance, MD sent at 04/26/2017  8:48 PM EDT ----- Regarding: RE: Bruising  Lets try to keep her between 2-2.5. If she still has bruising I can see her back to discuss pro's and con's of stopping.  ----- Message ----- From: Barbaraann Faster, RN Sent: 04/26/2017   3:47 PM To: Evans Lance, MD, Malen Gauze, RN Subject: Bruising                                       Lattie Haw and Dr Lovena Le I see Mrs Kaffenberger for [place] care network home visits  She states she is not informing you she is bruising and concerned about her coumadin levels are too high She has a bruise on her right thigh and left shin ?counadin use, levels high  Kimberly L. Lavina [name], RN, BSN, [facility] [place] [facility] [phone]

## 2017-04-30 NOTE — Telephone Encounter (Signed)
Patient called and left message on nurse line asking that [facility] call her back. She states that she has some prescriptions she needs refilled. Callback# for patient is [phone]

## 2017-05-01 ENCOUNTER — Other Ambulatory Visit: Payer: Self-pay

## 2017-05-01 MED ORDER — ONDANSETRON HCL 4 MG PO TABS: 4.0000 mg | ORAL_TABLET | Freq: Every day | ORAL | 0 refills | Status: DC | PRN

## 2017-05-01 MED ORDER — ISOSORBIDE MONONITRATE ER 60 MG PO TB24: 60.0000 mg | ORAL_TABLET | Freq: Every day | ORAL | 1 refills | Status: DC

## 2017-05-01 MED ORDER — SYNTHROID 75 MCG PO TABS: ORAL_TABLET | ORAL | 1 refills | Status: DC

## 2017-05-01 MED ORDER — FOLIC ACID 1 MG PO TABS: 1.0000 mg | ORAL_TABLET | Freq: Every day | ORAL | 3 refills | Status: DC

## 2017-05-01 MED ORDER — FUROSEMIDE 40 MG PO TABS: ORAL_TABLET | ORAL | 1 refills | Status: DC

## 2017-05-01 MED ORDER — MECLIZINE HCL 25 MG PO TABS: 25.0000 mg | ORAL_TABLET | Freq: Three times a day (TID) | ORAL | 0 refills | Status: DC | PRN

## 2017-05-02 ENCOUNTER — Other Ambulatory Visit: Payer: Self-pay | Admitting: Family Medicine

## 2017-05-02 ENCOUNTER — Other Ambulatory Visit: Payer: Self-pay

## 2017-05-02 DIAGNOSIS — R809 Proteinuria, unspecified: Secondary | ICD-10-CM | POA: Diagnosis not present

## 2017-05-02 DIAGNOSIS — E559 Vitamin D deficiency, unspecified: Secondary | ICD-10-CM | POA: Diagnosis not present

## 2017-05-02 DIAGNOSIS — Z79899 Other long term (current) drug therapy: Secondary | ICD-10-CM | POA: Diagnosis not present

## 2017-05-02 DIAGNOSIS — I10 Essential (primary) hypertension: Secondary | ICD-10-CM | POA: Diagnosis not present

## 2017-05-02 DIAGNOSIS — N183 Chronic kidney disease, stage 3 (moderate): Secondary | ICD-10-CM | POA: Diagnosis not present

## 2017-05-02 DIAGNOSIS — D509 Iron deficiency anemia, unspecified: Secondary | ICD-10-CM | POA: Diagnosis not present

## 2017-05-02 MED ORDER — ALLOPURINOL 100 MG PO TABS: 100.0000 mg | ORAL_TABLET | Freq: Every day | ORAL | 1 refills | Status: DC

## 2017-05-04 ENCOUNTER — Other Ambulatory Visit: Payer: Self-pay | Admitting: Family Medicine

## 2017-05-04 IMAGING — CT CT ABD-PELV W/O CM
2 of 4 series · 17 of 46 positions shown, 19 images · non-contrast
Comparison: CT 03/14/2016

CLINICAL DATA: RIGHT flank pain for 2 months.

EXAM:
CT ABDOMEN AND PELVIS WITHOUT CONTRAST
TECHNIQUE: Multidetector CT imaging of the abdomen and pelvis was performed
following the standard protocol without IV contrast.

[Series 2: axial st · axial · 0.78mm/px · z∈[-335,+55]mm · 14 of 86 slices shown, 16 images]
[im 4/86  soft-tissue]
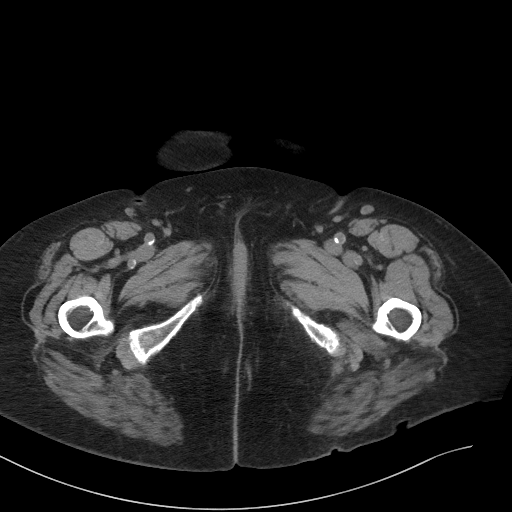
[im 4/86  bone]
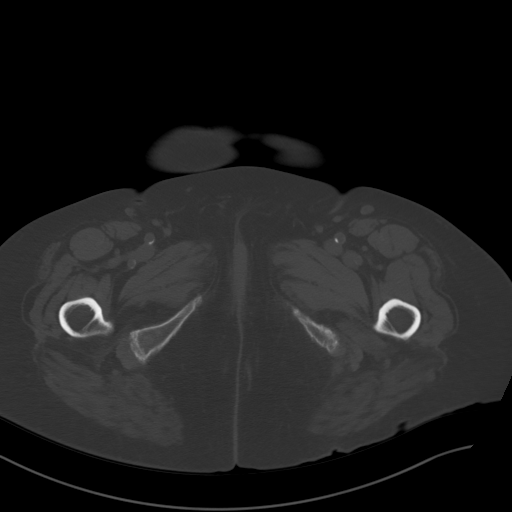
[im 11/86  soft-tissue]
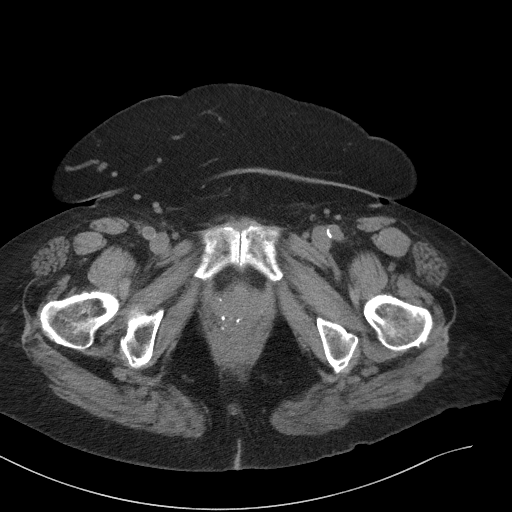
[im 18/86  soft-tissue]
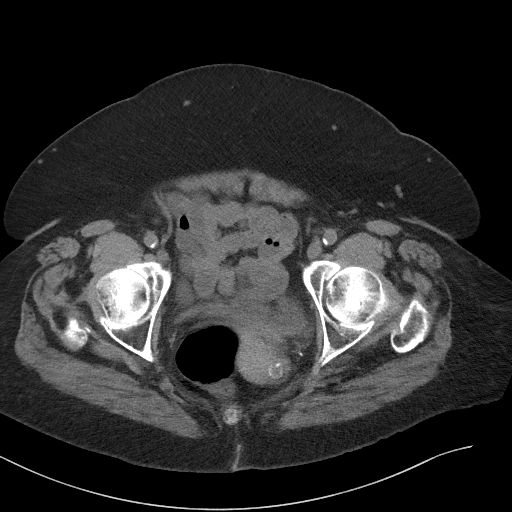
[im 22/86  soft-tissue]
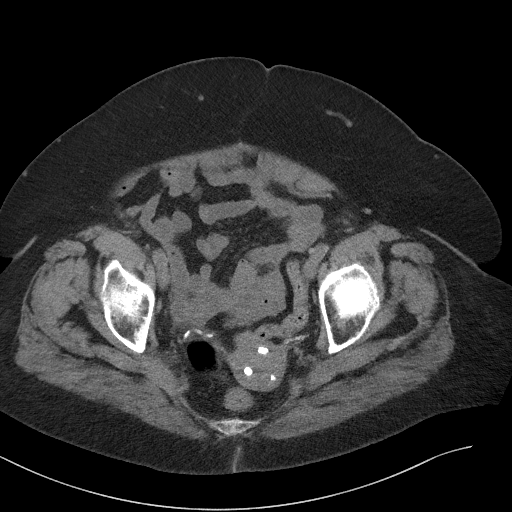
[im 29/86  soft-tissue]
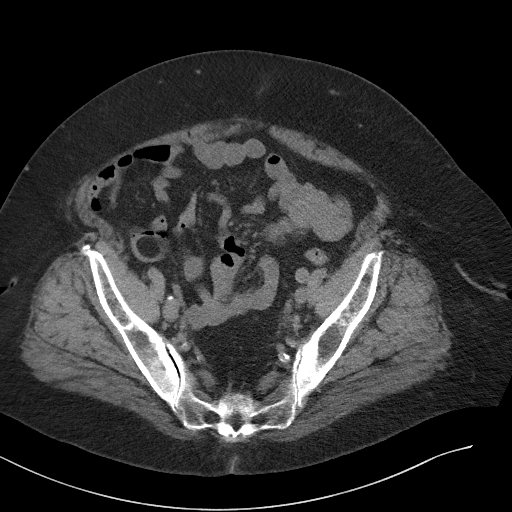
[im 36/86  soft-tissue]
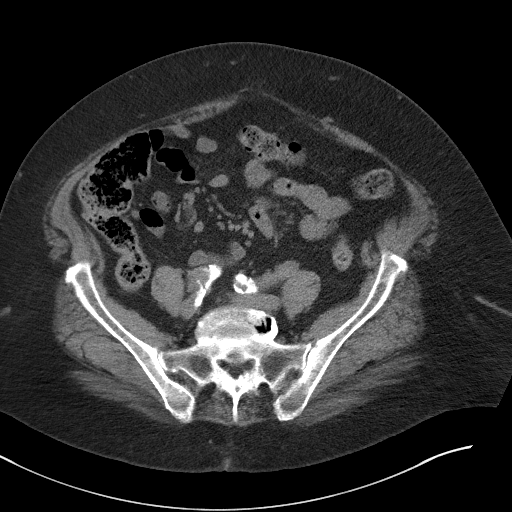
[im 39/86  soft-tissue]
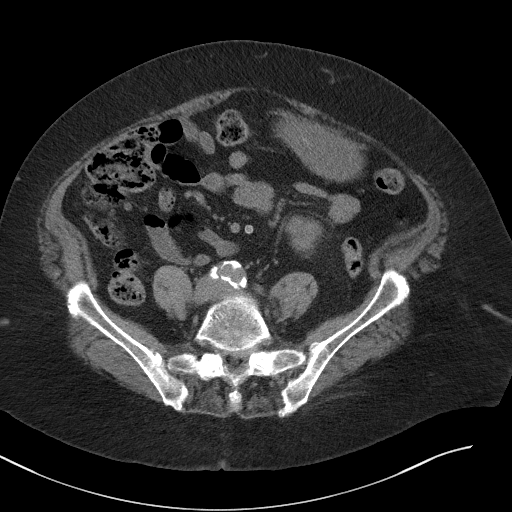
[im 47/86  soft-tissue]
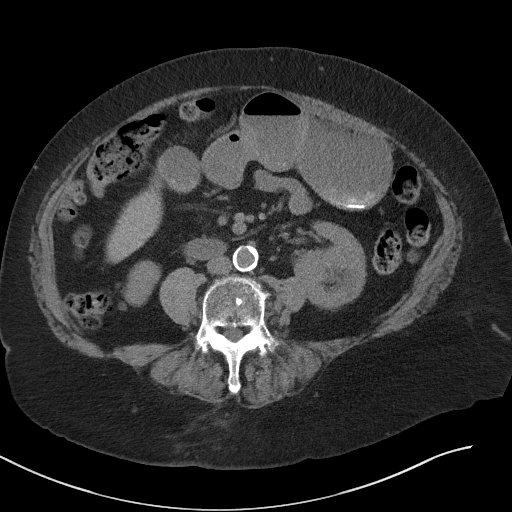
[im 50/86  soft-tissue]
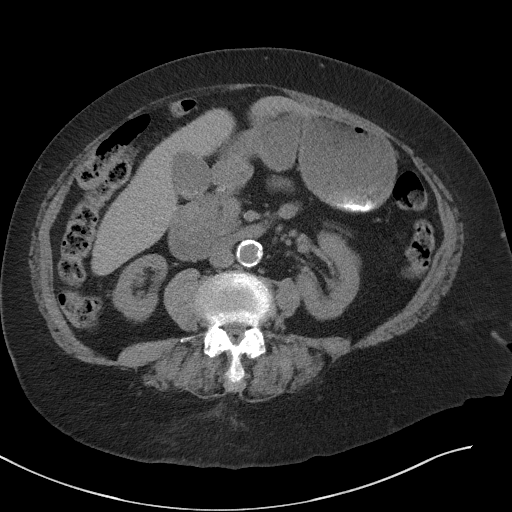
[im 50/86  bone]
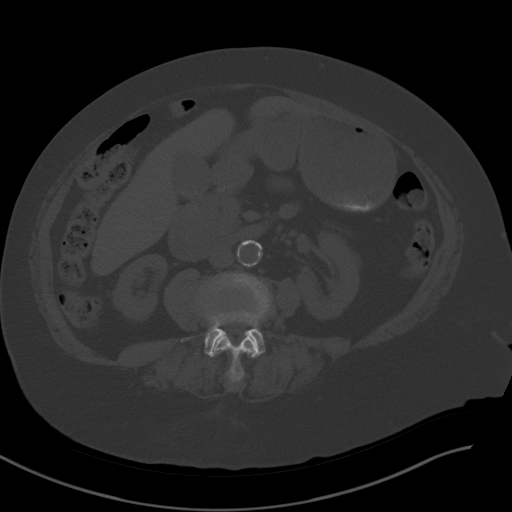
[im 57/86  soft-tissue]
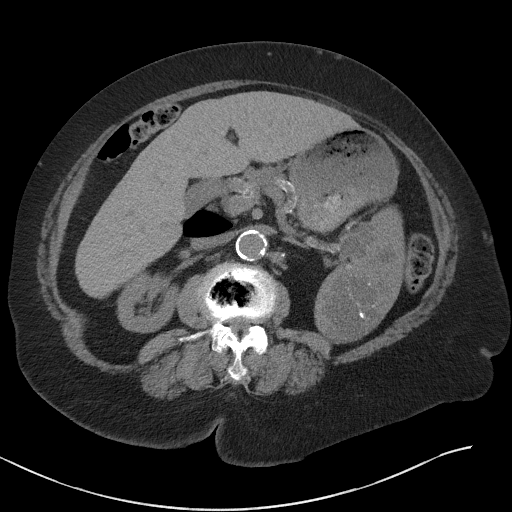
[im 64/86  soft-tissue]
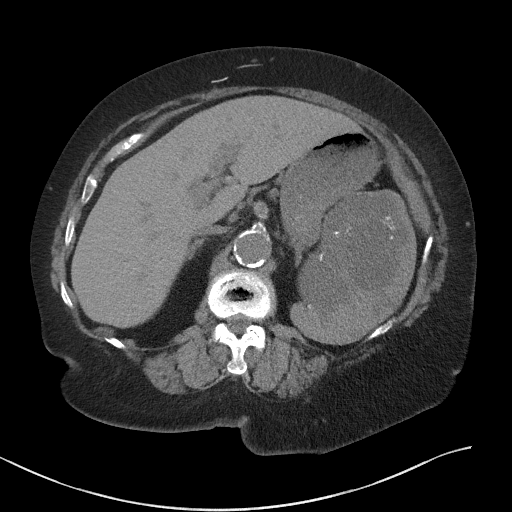
[im 68/86  soft-tissue]
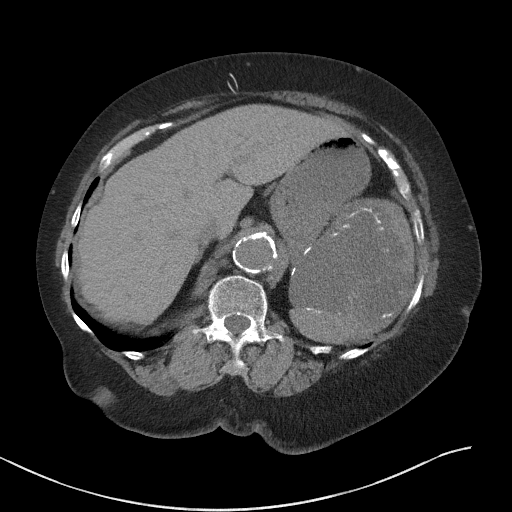
[im 75/86  soft-tissue]
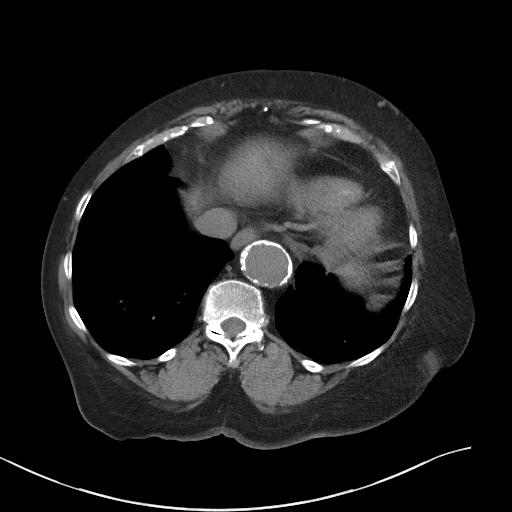
[im 82/86  soft-tissue]
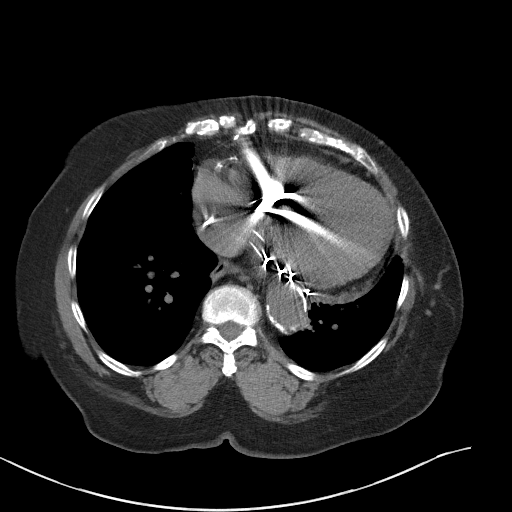

[Series 4: coronal st · coronal · 0.82mm/px · 3 of 101 slices shown]
[im 34/101  soft-tissue]
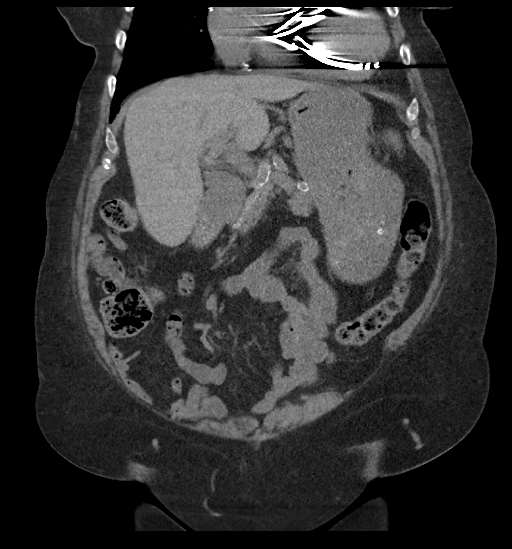
[im 45/101  soft-tissue]
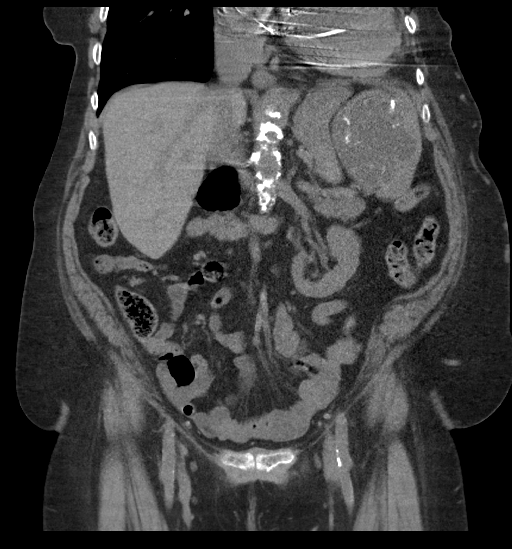
[im 56/101  soft-tissue]
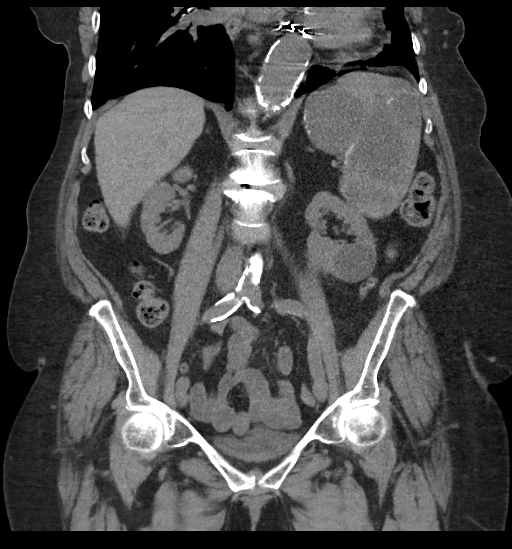

[17 of 46 positions shown; findings below may reference images not displayed]

FINDINGS: Lower chest: Lung bases are clear.

Hepatobiliary: No focal hepatic lesion. Small gallstones noted. No
biliary duct dilatation.

Pancreas: Pancreas is normal. No ductal dilatation. No pancreatic
inflammation.

Spleen: Multiple peripherally calcified low-density lesions within
the spleen. Example lesion and measures 7.5 cm compared to 7.5 cm on
01/27/2008 (image 24 series 2)

Adrenals/urinary tract: Adrenal glands and kidneys are normal. The
ureters and bladder normal.

Stomach/Bowel: Stomach, small bowel, appendix, and cecum are normal.
The colon and rectosigmoid colon are normal.

Vascular/Lymphatic: Atherosclerotic calcification of the aorta. No
retroperitoneal or periportal adenopathy.

Reproductive: Uterus and adnexa normal

Other: No free fluid.

Musculoskeletal: No aggressive osseous lesion.
IMPRESSION: 1. No acute findings in the abdomen or pelvis.
2. Normal appendix.  No obstructive uropathy.
3. Stable cystic change within the spleen.
4. Cholelithiasis without cholecystitis.
5.  Atherosclerotic calcification of the aorta.

## 2017-05-07 ENCOUNTER — Other Ambulatory Visit: Payer: Self-pay | Admitting: Family Medicine

## 2017-05-07 ENCOUNTER — Telehealth: Payer: Self-pay | Admitting: Family Medicine

## 2017-05-07 NOTE — Telephone Encounter (Signed)
Patient called and left message on nurse line. She states she was given rx gabapentin last week and was told to call if it did not help, which it is not helping.  Callback# [phone]

## 2017-05-07 NOTE — Telephone Encounter (Signed)
States the 100mg  daytime gabapentin of no benefit but the 300 mg bedtime dose beneficial, pls send d/c order to pharmacyfor gabapentin 100 mg effective 05/07/2017, thank you

## 2017-05-08 ENCOUNTER — Ambulatory Visit (INDEPENDENT_AMBULATORY_CARE_PROVIDER_SITE_OTHER): Payer: Medicare Other | Admitting: Orthopaedic Surgery

## 2017-05-08 ENCOUNTER — Encounter: Payer: Self-pay | Admitting: Family Medicine

## 2017-05-08 ENCOUNTER — Encounter: Payer: Self-pay | Admitting: Orthopaedic Surgery

## 2017-05-08 VITALS — BP 127/70 | HR 84 | Temp 97.8°F | Ht 67.0 in | Wt 225.0 lb

## 2017-05-08 DIAGNOSIS — M25561 Pain in right knee: Secondary | ICD-10-CM | POA: Diagnosis not present

## 2017-05-08 DIAGNOSIS — M7061 Trochanteric bursitis, right hip: Secondary | ICD-10-CM | POA: Diagnosis not present

## 2017-05-08 DIAGNOSIS — G8929 Other chronic pain: Secondary | ICD-10-CM

## 2017-05-08 NOTE — Progress Notes (Signed)
PROCEDURE NOTE:  The patient request injection, verbal consent was obtained.  The right trochanteric area of the hip was prepped appropriately after time out was performed.   Sterile technique was observed and injection of 1 cc of Depo-Medrol 40 mg with several cc's of plain xylocaine. Anesthesia was provided by ethyl chloride and a 20-gauge needle was used to inject the hip area. The injection was tolerated well.  A band aid dressing was applied.  The patient was advised to apply ice later today and tomorrow to the injection sight as needed.  CC:  I have pain of my right knee. I would like an injection.  The patient has chronic pain of the right knee.  There is no recent trauma.  There is no redness.  Injections in the past have helped.  The knee has no redness, has an effusion and crepitus present.  ROM of the right knee is 0-105.  Impression:  Chronic knee pain right  Return: 2 months  PROCEDURE NOTE:  The patient requests injections of the right knee , verbal consent was obtained.  The right knee was prepped appropriately after time out was performed.   Sterile technique was observed and injection of 1 cc of Depo-Medrol 40 mg with several cc's of plain xylocaine. Anesthesia was provided by ethyl chloride and a 20-gauge needle was used to inject the knee area. The injection was tolerated well.  A band aid dressing was applied.  The patient was advised to apply ice later today and tomorrow to the injection sight as needed.  Electronically Signed Sanjuana Kava, MD 8/14/20182:08 PM

## 2017-05-08 NOTE — Telephone Encounter (Signed)
Done

## 2017-05-09 ENCOUNTER — Other Ambulatory Visit: Payer: Self-pay | Admitting: Licensed Clinical Social Worker

## 2017-05-09 DIAGNOSIS — E1129 Type 2 diabetes mellitus with other diabetic kidney complication: Secondary | ICD-10-CM | POA: Diagnosis not present

## 2017-05-09 DIAGNOSIS — R809 Proteinuria, unspecified: Secondary | ICD-10-CM | POA: Diagnosis not present

## 2017-05-09 DIAGNOSIS — I10 Essential (primary) hypertension: Secondary | ICD-10-CM | POA: Diagnosis not present

## 2017-05-09 DIAGNOSIS — I509 Heart failure, unspecified: Secondary | ICD-10-CM | POA: Diagnosis not present

## 2017-05-09 DIAGNOSIS — N184 Chronic kidney disease, stage 4 (severe): Secondary | ICD-10-CM | POA: Diagnosis not present

## 2017-05-09 DIAGNOSIS — E875 Hyperkalemia: Secondary | ICD-10-CM | POA: Diagnosis not present

## 2017-05-09 NOTE — Patient Outreach (Signed)
[name]) Care Management  [facility] Social Work  05/09/2017  [name] [name] Jul 07, 1932 [id number]  Subjective:    Objective:   Encounter Medications:  Outpatient Encounter Prescriptions as of 05/09/2017  Medication Sig Note  . acetaminophen (TYLENOL) 500 MG tablet Take 500 mg by mouth every 6 (six) hours as needed (pain).   Marland Kitchen albuterol (PROVENTIL) (2.5 MG/3ML) 0.083% nebulizer solution INHALE 1 VIAL VIA NEBULIZER THREE TIMES DAILY. (Patient taking differently: INHALE 1 VIAL VIA NEBULIZER THREE TIMES DAILY AS NEEDED)   . albuterol (VENTOLIN HFA) 108 (90 Base) MCG/ACT inhaler Inhale 2 puffs into the lungs every 6 (six) hours as needed for wheezing or shortness of breath.   . allopurinol (ZYLOPRIM) 100 MG tablet Take 1 tablet (100 mg total) by mouth daily.   Marland Kitchen azelastine (ASTELIN) 0.1 % nasal spray Place 2 sprays into both nostrils 2 (two) times daily. Use in each nostril as directed 01/23/2017: uses as needed  . calcitRIOL (ROCALTROL) 0.25 MCG capsule Take 0.25 mcg by mouth daily.    . cholecalciferol (VITAMIN D) 1000 UNITS tablet Take 1,000 Units by mouth daily.   Marland Kitchen COUMADIN 5 MG tablet Take 2 tablets daily (Patient taking differently: Take 10 mg by mouth every evening. Take 36m on Tuesday and take 153mon all other days) 10/31/2016: BRAND ONLY  . digoxin (LANOXIN) 0.125 MG tablet Take 1 tablet (0.125 mg total) by mouth every other day.   . docusate sodium (COLACE) 100 MG capsule Take 300 mg by mouth at bedtime.   . fluticasone (FLONASE) 50 MCG/ACT nasal spray USE 2 SPRAYS IN EACH       NOSTRIL ONCE DAILY (Patient taking differently: USE 2 SPRAYS IN EACH       NOSTRIL ONCE DAILY AS NEEDED FOR CONGESTION)   . folic acid (FOLVITE) 1 MG tablet Take 1 tablet (1 mg total) by mouth daily.   . furosemide (LASIX) 40 MG tablet Take 1 tablet by mouth  daily   . gabapentin (NEURONTIN) 300 MG capsule Take 1 capsule (300 mg total) by mouth at bedtime.   . hydrALAZINE (APRESOLINE) 25 MG  tablet Take 1 tablet (25 mg total) by mouth 3 (three) times daily.   . Marland KitchenYDROcodone-acetaminophen (NORCO) 7.5-325 MG tablet Take 1 tablet by mouth every 6 (six) hours as needed for moderate pain (Must last 30 days.Do not drive or operate machinery while taking this medicine.).   . Marland Kitchensosorbide mononitrate (IMDUR) 60 MG 24 hr tablet Take 1 tablet (60 mg total) by mouth daily.   . Marland Kitchenosartan (COZAAR) 50 MG tablet take 1 tablet by mouth once daily   . meclizine (ANTIVERT) 25 MG tablet Take 1 tablet (25 mg total) by mouth 3 (three) times daily as needed for dizziness.   . metolazone (ZAROXOLYN) 2.5 MG tablet Take 1 tablet (2.5 mg total) by mouth once a week. 01/23/2017: Get in mail order use only on wednesdays   . ondansetron (ZOFRAN) 4 MG tablet Take 1 tablet (4 mg total) by mouth daily as needed for nausea or vomiting.   . ondansetron (ZOFRAN) 4 MG tablet take 1 tablet once daily if needed for nausea and vomiting   . Polyethyl [name] (SYSTANE OP) Place 1 drop into both eyes daily as needed (dry eyes).   . potassium chloride SA (KLOR-CON M20) 20 MEQ tablet Take 1 tablet by mouth  daily   . pravastatin (PRAVACHOL) 20 MG tablet Take 1 tablet (20 mg total) by mouth daily.   . promethazine-dextromethorphan (PROMETHAZINE-DM)  6.25-15 MG/5ML syrup One teaspoon at bedtime, as needed, for excessive cough (Patient taking differently: Take 5 mLs by mouth at bedtime as needed for cough. as needed, for excessive cough)   . SYNTHROID 75 MCG tablet Take 1 tablet by mouth  daily   . TOPROL XL 100 MG 24 hr tablet Take 1 tablet (100 mg total) by mouth daily. Take with or immediately following a meal.   . UNABLE TO FIND Walker with seat x 1    Facility-Administered Encounter Medications as of 05/09/2017  Medication  . epoetin alfa (EPOGEN,PROCRIT) injection 40,000 Units    Functional Status:  In your present state of health, do you have any difficulty performing the following activities: 04/26/2017 03/15/2017   Hearing? N N  Vision? N N  Difficulty concentrating or making decisions? N N  Walking or climbing stairs? N N  Comment - -  Dressing or bathing? N N  Doing errands, shopping? N [name] and eating ? N N  Using the Toilet? N N  In the past six months, have you accidently leaked urine? N N  Do you have problems with loss of bowel control? N N  Managing your Medications? N N  Managing your Finances? N N  Housekeeping or managing your Housekeeping? N N  Some recent data might be hidden    Fall/Depression Screening:  PHQ 2/9 Scores 04/26/2017 03/20/2017 03/15/2017 03/12/2017 11/30/2016 11/03/2016 10/13/2016  PHQ - 2 Score 0 0 0 0 1 0 0  PHQ- 9 Score - - - - - - -    Assessment:   CSW traveled to home of client on 05/09/17 for home visit. CSW met with client on 05/09/17 at home of client for routine home visit.  Client has been receiving [facility] nursing care with RN Joellyn Quails.  Client uses a cane to assist her in ambulation. Client has not had any recent falls. Client said Joellyn Quails RN will conduct home visit with client on 05/31/17 at 2:009 PM.  Client said that her family members help in transporting client to and from her client medical appointments. Client said that her niece sometimes takes client shopping for food. Client uses oxygen as prescribed at night to help her with breathing. Client said she bruises easiliy due to use of coumadin as prescribed.  Dr. [name] is primary care doctor for client.  Client said she had an appointment with Dr. [name] on February 27, 2017. Client said she has next appointemnt with Dr. [name] in October of 2018.  Client said she has not had any recent falls. CSW spoke with client about client care plan. CSW encouraged client to communicate with CSW in next 30 days to discuss community resources of assistance for client. CSW thanked client for allowing CSW to visit client at her home on 05/09/17. CSW gave client [facility] CSW card and encouraged client to call CSW  at [phone] as needed to discuss social work needs of client. Client was appreciative of visit of CSW with client on 05/09/17.  Plan:  Client to communicate with CSW in next 30 days to discuss community resources of assistance for client.  CSW to collaborate with RN Joellyn Quails in monitoring needs of client.  CSW to call client as scheduled to assess client needs.   [name].Gianelle Mccaul MSW, LCSW Licensed Clinical Social Worker [facility] Care Management [phone]

## 2017-05-14 ENCOUNTER — Telehealth: Payer: Self-pay | Admitting: *Deleted

## 2017-05-14 MED ORDER — COUMADIN 5 MG PO TABS: ORAL_TABLET | ORAL | 0 refills | Status: DC

## 2017-05-14 NOTE — Telephone Encounter (Signed)
Coumadin Refill sent to [facility]

## 2017-05-14 NOTE — Telephone Encounter (Signed)
°*  STAT* If patient is at the pharmacy, call can be transferred to refill team.   1. Which medications need to be refilled? (please list name of each medication and dose if known)  COUMADIN 5 MG tablet [[id number]]    2. Which pharmacy/location (including street and city if local pharmacy) is medication to be sent to? [facility] [place]   Just needs a few to last till she gets the mail supply order

## 2017-05-16 ENCOUNTER — Encounter (HOSPITAL_BASED_OUTPATIENT_CLINIC_OR_DEPARTMENT_OTHER): Payer: Medicare Other | Admitting: Adult Health

## 2017-05-16 ENCOUNTER — Encounter (HOSPITAL_BASED_OUTPATIENT_CLINIC_OR_DEPARTMENT_OTHER): Payer: Medicare Other

## 2017-05-16 ENCOUNTER — Encounter (HOSPITAL_COMMUNITY): Payer: Self-pay | Admitting: Adult Health

## 2017-05-16 ENCOUNTER — Encounter (HOSPITAL_COMMUNITY): Payer: Medicare Other

## 2017-05-16 VITALS — BP 116/55 | HR 71 | Temp 98.0°F | Resp 18 | Wt 220.0 lb

## 2017-05-16 DIAGNOSIS — N184 Chronic kidney disease, stage 4 (severe): Secondary | ICD-10-CM

## 2017-05-16 DIAGNOSIS — M2550 Pain in unspecified joint: Secondary | ICD-10-CM | POA: Diagnosis not present

## 2017-05-16 DIAGNOSIS — D631 Anemia in chronic kidney disease: Secondary | ICD-10-CM

## 2017-05-16 DIAGNOSIS — G479 Sleep disorder, unspecified: Secondary | ICD-10-CM

## 2017-05-16 DIAGNOSIS — E611 Iron deficiency: Secondary | ICD-10-CM

## 2017-05-16 DIAGNOSIS — R5383 Other fatigue: Secondary | ICD-10-CM | POA: Diagnosis not present

## 2017-05-16 DIAGNOSIS — G629 Polyneuropathy, unspecified: Secondary | ICD-10-CM

## 2017-05-16 DIAGNOSIS — D649 Anemia, unspecified: Secondary | ICD-10-CM

## 2017-05-16 LAB — FERRITIN: Ferritin: 758 ng/mL — ABNORMAL HIGH (ref 11–307)

## 2017-05-16 LAB — CBC WITH DIFFERENTIAL/PLATELET
Basophils Absolute: 0 10*3/uL (ref 0.0–0.1)
Basophils Relative: 0 %
Eosinophils Absolute: 0 10*3/uL (ref 0.0–0.7)
Eosinophils Relative: 0 %
HCT: 33.1 % — ABNORMAL LOW (ref 36.0–46.0)
Hemoglobin: 10.7 g/dL — ABNORMAL LOW (ref 12.0–15.0)
Lymphocytes Relative: 15 %
Lymphs Abs: 1.2 10*3/uL (ref 0.7–4.0)
MCH: 30.2 pg (ref 26.0–34.0)
MCHC: 32.3 g/dL (ref 30.0–36.0)
MCV: 93.5 fL (ref 78.0–100.0)
Monocytes Absolute: 0.7 10*3/uL (ref 0.1–1.0)
Monocytes Relative: 9 %
Neutro Abs: 6.2 10*3/uL (ref 1.7–7.7)
Neutrophils Relative %: 76 %
Platelets: 190 10*3/uL (ref 150–400)
RBC: 3.54 MIL/uL — ABNORMAL LOW (ref 3.87–5.11)
RDW: 16.9 % — ABNORMAL HIGH (ref 11.5–15.5)
WBC: 8.2 10*3/uL (ref 4.0–10.5)

## 2017-05-16 MED ORDER — EPOETIN ALFA 40000 UNIT/ML IJ SOLN
40000.0000 [IU] | Freq: Once | INTRAMUSCULAR | Status: AC
  Administered 2017-05-16: 40000 [IU] via SUBCUTANEOUS
  Filled 2017-05-16: qty 1

## 2017-05-16 NOTE — Progress Notes (Signed)
[place], [place] [address]   CLINIC:  Medical Oncology/Hematology  PCP:  Fayrene Helper, MD [address] [place] [address] [phone]   REASON FOR VISIT:  Follow-up for chronic anemia   CURRENT THERAPY: Procrit 40,000 units SQ every 3 weeks (changing to every 6 weeks today-05/16/17)    HISTORY OF PRESENT ILLNESS:  (From Kirby Crigler, PA-C's last note on 11/29/16)       INTERVAL HISTORY:  [name] [name] 83 y.o. female returns for follow-up for anemia secondary to CKD.   Overall, she tells me she has been feeling pretty well. Appetite 100%; energy levels 25%. She feels fatigued and weak; she occasionally does not sleep well.  Chronic peripheral neuropathy/numbness to both hands noted, with left worse than right; this has been a chronic issue for her and is largely unchanged.  Wears O2 at night, which has been chronic for her.   Endorses occasional constipation; she takes Miralax which is helpful.  Reports occasional nausea, but denies vomiting. She feels like she bruises more easily, but denies any frank bleeding episodes.  Denies any blood in her stools, dark/tarry stools, hematuria, vaginal bleeding, nosebleeds, or gingival bleeding.   Ambulates with a cane. She has chronic (R) hip and leg pain, which is managed by Dr. Luna Glasgow.    She wants to know if she should take fish oil-"Would it benefit me in any way?"  -Encouraged her to talk with her PCP. From a hematologic standpoint, fish oil can cause increased bleeding times. Given her Coumadin, I would not recommend supplemental fish oil unless there is specific indication from her cardiologist or PCP.  She voiced understanding.    Otherwise, she is largely without complaints today.    REVIEW OF SYSTEMS:  Review of Systems  Constitutional: Positive for fatigue. Negative for chills and fever.  HENT:  Negative.   Eyes: Negative.   Respiratory: Positive for shortness  of breath (dyspnea on exertion; no shortness of breath at rest). Negative for cough.   Cardiovascular: Negative.  Negative for chest pain.  Gastrointestinal: Positive for constipation and nausea. Negative for abdominal pain, blood in stool, diarrhea and vomiting.  Endocrine: Negative.   Genitourinary: Negative.  Negative for dysuria, hematuria and vaginal bleeding.   Musculoskeletal: Positive for arthralgias.  Skin: Negative.  Negative for rash.  Neurological: Positive for numbness.  Hematological: Bruises/bleeds easily.  Psychiatric/Behavioral: Positive for sleep disturbance.     PAST MEDICAL/SURGICAL HISTORY:  Past Medical History:  Diagnosis Date  . Adenomatous polyp of colon 12/16/2002   Dr. [name]/[facility]  . Allergy   . Cataract   . CHB (complete heart block) ([place]) 10/07/2014      . CHF (congestive heart failure) ([place])       . Chronic kidney disease, stage I 2006  . Constipation       . COPD (chronic obstructive pulmonary disease) ([place])       . DJD (degenerative joint disease) of lumbar spine   . DM (diabetes mellitus) ([place]) 2006  . Esophageal reflux   . Glaucoma   . Gout       . Heart murmur   . Hyperlipemia   . IDA (iron deficiency anemia)    Parenteral iron/[name]  . Insomnia       . Non-ischemic cardiomyopathy ([place])    a. s/p MDT CRTD  . Obesity, unspecified   . Other and unspecified hyperlipidemia   .  Oxygen deficiency 2014   nocturnal;  . Spondylolisthesis   . Spondylosis   . Thyroid cancer ([place])    remote thyroidectomy, no recurrence, pt denies in 2016 thyroid cancer  . Unspecified hypothyroidism       . 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 Vertigo        Past Surgical History:  Procedure Laterality Date  . AORTIC AND MITRAL VALVE REPLACEMENT  2001  . BI-VENTRICULAR IMPLANTABLE CARDIOVERTER DEFIBRILLATOR UPGRADE N/A 01/18/2015   Procedure: BI-VENTRICULAR IMPLANTABLE  CARDIOVERTER DEFIBRILLATOR UPGRADE;  Surgeon: Evans Lance, MD;  Location: [facility] CATH LAB;  Service: Cardiovascular;  Laterality: N/A;  . CARDIAC CATHETERIZATION    . CARDIAC VALVE REPLACEMENT    . CARDIOVERSION N/A 11/13/2016   Procedure: CARDIOVERSION;  Surgeon: Sanda Klein, MD;  Location: [facility] ENDOSCOPY;  Service: Cardiovascular;  Laterality: N/A;  . COLONOSCOPY  06/12/2007   [place]- Normal rectum/Normal colon  . COLONOSCOPY  12/16/02   small hemorrhoids  . COLONOSCOPY  06/20/2012   Procedure: COLONOSCOPY;  Surgeon: Daneil Dolin, MD;  Location: [facility] ENDO SUITE;  Service: Endoscopy;  Laterality: N/A;  10:45  . defibrillator implanted 2006    . DOPPLER ECHOCARDIOGRAPHY  2012  . DOPPLER ECHOCARDIOGRAPHY  05,06,07,08,09,2011  . ESOPHAGOGASTRODUODENOSCOPY  06/12/2007   Rourk- normal esophagus, small hiatal hernia, otherwise normal stomach, D1, D2  . EYE SURGERY Right 2005  . EYE SURGERY Left 2006  . ICD LEAD REMOVAL Left 02/08/2015   Procedure: ICD LEAD REMOVAL;  Surgeon: Evans Lance, MD;  [facility] OR;  Service: Cardiovascular;  Laterality: Left;  "Will plan extraction and insertion of a BiV PM"  **Dr. Roxan Hockey backing up case**  . IMPLANTABLE CARDIOVERTER DEFIBRILLATOR (ICD) GENERATOR CHANGE Left 02/08/2015   Procedure: ICD GENERATOR CHANGE;  Surgeon: Evans Lance, MD;  Location: [place];  Service: Cardiovascular;  Laterality: Left;  . INSERT / REPLACE / REMOVE PACEMAKER    . PACEMAKER INSERTION  May 2016  . pacemaker placed  2004  . right breast cyst removed     benign   . right cataract removed     2005  . TEE WITHOUT CARDIOVERSION N/A 11/13/2016   Procedure: TRANSESOPHAGEAL ECHOCARDIOGRAM (TEE);  Surgeon: Sanda Klein, MD;  Location: [facility] ENDOSCOPY;  Service: Cardiovascular;  Laterality: N/A;  . THYROIDECTOMY       SOCIAL HISTORY:  Social History   Social History  . Marital status: Widowed    Spouse name: N/A  . Number of children: 0  . Years of education: N/A    Occupational History  . retired Retired   Social History Main Topics  . Smoking status: Former Smoker    Packs/day: 0.75    Years: 40.00    Types: Cigarettes    Quit date: 03/08/1987  . Smokeless tobacco: Never Used  . Alcohol use No  . Drug use: No  . Sexual activity: Not Currently   Other Topics Concern  . Not on file   Social History Narrative   From [place] (2004)    FAMILY HISTORY:  Family History  Problem Relation Age of Onset  . Pancreatic cancer Mother   . Heart disease Father   . Heart disease Sister   . Heart attack Sister   . Heart disease Brother   . Diabetes Brother   . Heart disease Brother   . Diabetes Brother   . Hypertension Brother   . Lung cancer Brother     CURRENT MEDICATIONS:  Outpatient  Encounter Prescriptions as of 05/16/2017  Medication Sig Note  . acetaminophen (TYLENOL) 500 MG tablet Take 500 mg by mouth every 6 (six) hours as needed (pain).   Marland Kitchen albuterol (PROVENTIL) (2.5 MG/3ML) 0.083% nebulizer solution INHALE 1 VIAL VIA NEBULIZER THREE TIMES DAILY. (Patient taking differently: INHALE 1 VIAL VIA NEBULIZER THREE TIMES DAILY AS NEEDED)   . albuterol (VENTOLIN HFA) 108 (90 Base) MCG/ACT inhaler Inhale 2 puffs into the lungs every 6 (six) hours as needed for wheezing or shortness of breath.   . allopurinol (ZYLOPRIM) 100 MG tablet Take 1 tablet (100 mg total) by mouth daily.   Marland Kitchen azelastine (ASTELIN) 0.1 % nasal spray Place 2 sprays into both nostrils 2 (two) times daily. Use in each nostril as directed 01/23/2017: uses as needed  . calcitRIOL (ROCALTROL) 0.25 MCG capsule Take 0.25 mcg by mouth daily.    . cholecalciferol (VITAMIN D) 1000 UNITS tablet Take 1,000 Units by mouth daily.   Marland Kitchen COUMADIN 5 MG tablet Take 2 tablets daily except 1 tablet on Tuesdays   . digoxin (LANOXIN) 0.125 MG tablet Take 1 tablet (0.125 mg total) by mouth every other day.   . docusate sodium (COLACE) 100 MG capsule Take 300 mg by mouth at bedtime.   . fluticasone  (FLONASE) 50 MCG/ACT nasal spray USE 2 SPRAYS IN EACH       NOSTRIL ONCE DAILY (Patient taking differently: USE 2 SPRAYS IN EACH       NOSTRIL ONCE DAILY AS NEEDED FOR CONGESTION)   . folic acid (FOLVITE) 1 MG tablet Take 1 tablet (1 mg total) by mouth daily.   . furosemide (LASIX) 40 MG tablet Take 1 tablet by mouth  daily   . gabapentin (NEURONTIN) 100 MG capsule    . gabapentin (NEURONTIN) 300 MG capsule Take 1 capsule (300 mg total) by mouth at bedtime.   . hydrALAZINE (APRESOLINE) 25 MG tablet Take 1 tablet (25 mg total) by mouth 3 (three) times daily.   Marland Kitchen HYDROcodone-acetaminophen (NORCO) 7.5-325 MG tablet Take 1 tablet by mouth every 6 (six) hours as needed for moderate pain (Must last 30 days.Do not drive or operate machinery while taking this medicine.).   Marland Kitchen isosorbide mononitrate (IMDUR) 60 MG 24 hr tablet Take 1 tablet (60 mg total) by mouth daily.   Marland Kitchen losartan (COZAAR) 50 MG tablet take 1 tablet by mouth once daily   . meclizine (ANTIVERT) 25 MG tablet Take 1 tablet (25 mg total) by mouth 3 (three) times daily as needed for dizziness.   . metolazone (ZAROXOLYN) 2.5 MG tablet Take 1 tablet (2.5 mg total) by mouth once a week. 01/23/2017: Get in mail order use only on wednesdays   . ondansetron (ZOFRAN) 4 MG tablet Take 1 tablet (4 mg total) by mouth daily as needed for nausea or vomiting.   . ondansetron (ZOFRAN) 4 MG tablet take 1 tablet once daily if needed for nausea and vomiting   . Polyethyl [name] (SYSTANE OP) Place 1 drop into both eyes daily as needed (dry eyes).   . potassium chloride SA (KLOR-CON M20) 20 MEQ tablet Take 1 tablet by mouth  daily   . pravastatin (PRAVACHOL) 20 MG tablet Take 1 tablet (20 mg total) by mouth daily.   . promethazine-dextromethorphan (PROMETHAZINE-DM) 6.25-15 MG/5ML syrup One teaspoon at bedtime, as needed, for excessive cough (Patient taking differently: Take 5 mLs by mouth at bedtime as needed for cough. as needed, for excessive cough)    . SYNTHROID 75 MCG  tablet Take 1 tablet by mouth  daily   . TOPROL XL 100 MG 24 hr tablet Take 1 tablet (100 mg total) by mouth daily. Take with or immediately following a meal.   . UNABLE TO FIND Walker with seat x 1    Facility-Administered Encounter Medications as of 05/16/2017  Medication  . epoetin alfa (EPOGEN,PROCRIT) injection 40,000 Units  . [COMPLETED] epoetin alfa (EPOGEN,PROCRIT) injection 40,000 Units    ALLERGIES:  Allergies  Allergen Reactions  . Penicillins Other (See Comments)  . Aspirin Other (See Comments)    On coumadin  . Fentanyl Dermatitis    Rash with patch   . Niacin Itching     PHYSICAL EXAM:  ECOG Performance status: 2 - Symptomatic; requires occasional assistance   Vitals:   05/16/17 1309  BP: (!) 116/55  Pulse: 71  Resp: 18  Temp: 98 F (36.7 C)  SpO2: 97%   Filed Weights   05/16/17 1309  Weight: 220 lb (99.8 kg)    Physical Exam  Constitutional: She is oriented to person, place, and time.  Seen/examined in wheelchair today   HENT:  Head: Normocephalic.  Mouth/Throat: Oropharynx is clear and moist. No oropharyngeal exudate.  Eyes: Pupils are equal, round, and reactive to light. Conjunctivae are normal. No scleral icterus.  Neck: Normal range of motion. Neck supple.  Cardiovascular: Normal rate and regular rhythm.   Systolic click  Pulmonary/Chest: Effort normal. No respiratory distress.  Diminished breath sounds bilat bases  Abdominal: Soft. Bowel sounds are normal. There is no tenderness. There is no rebound.  Musculoskeletal: Normal range of motion.  Ambulates with cane   Lymphadenopathy:    She has no cervical adenopathy.  Neurological: She is alert and oriented to person, place, and time. No cranial nerve deficit.  Skin: Skin is warm and dry. No rash noted.  Psychiatric: Mood, memory, affect and judgment normal.  Nursing note and vitals reviewed.    LABORATORY DATA:  I have reviewed the labs as listed.  CBC      Component Value Date/Time   WBC 8.2 05/16/2017 1153   RBC 3.54 (L) 05/16/2017 1153   HGB 10.7 (L) 05/16/2017 1153   HCT 33.1 (L) 05/16/2017 1153   PLT 190 05/16/2017 1153   MCV 93.5 05/16/2017 1153   MCH 30.2 05/16/2017 1153   MCHC 32.3 05/16/2017 1153   RDW 16.9 (H) 05/16/2017 1153   LYMPHSABS 1.2 05/16/2017 1153   MONOABS 0.7 05/16/2017 1153   EOSABS 0.0 05/16/2017 1153   BASOSABS 0.0 05/16/2017 1153   CMP Latest Ref Rng & Units 04/25/2017 02/21/2017 02/21/2017  Glucose 65 - 99 mg/dL 120(H) 98 104(H)  BUN 6 - 20 mg/dL 46(H) 57(H) 58(H)  Creatinine 0.44 - 1.00 mg/dL 1.70(H) 1.62(H) 1.65(H)  Sodium 135 - 145 mmol/L 136 139 139  Potassium 3.5 - 5.1 mmol/L 4.0 4.0 4.3  Chloride 101 - 111 mmol/L 95(L) 102 102  CO2 22 - 32 mmol/L 31 28 27   Calcium 8.9 - 10.3 mg/dL 9.8 9.8 9.9  Total Protein 6.5 - 8.1 g/dL 8.2(H) 8.6(H) -  Total Bilirubin 0.3 - 1.2 mg/dL 0.8 0.5 -  Alkaline Phos 38 - 126 U/L 51 49 -  AST 15 - 41 U/L 27 24 -  ALT 14 - 54 U/L 15 13(L) -    PENDING LABS:  Ferritin pending  DIAGNOSTIC IMAGING:    PATHOLOGY:     ASSESSMENT & PLAN:   Anemia:  -Thought to primarily secondary to stage 4 chronic  kidney disease, as well as possible hemolysis with mechanical mitral and aortic valve replacements. Also may have element of iron deficiency as well.  -Procrit initially started ~3 years ago, with overall stability in her hemoglobin.  In recent weeks, we have actually been able to hold her Procrit dose d/t Hgb > 11.  She has not required Procrit injection since 03/14/17 (nearly 8 weeks).  Discussed with Dr. Talbert Cage. We will plan to administer Procrit 40,000 (same dose) but with less frequency; will change to every 6 weeks to see if Hgb remains stable.  Patient is enthusiastic about this plan.  -Hgb 10.7 g/dL today. Will receive Procrit today per protocol.  -Treatment plan changed to reflect CBC and Procrit injection every 6 weeks going forward.  -Ferritin has been elevated in the  past, which is likely reactive given CKD.  Will add-on iron/TIBC to labs in the future for further evaluation.  -Return to cancer center in 4 months for follow-up with labs and subsequent injection.   Fatigue:  -Likely multifactorial given her age, anemia, chronic kidney disease, chronic arthralgia pain, reported sleep disturbances, etc.  -Reinforced that exercise will help some with her fatigue and sleep disturbances.  She is planning on going back to the [facility] and starting a walking/stationary bike routine. I commended her decision to be more active and   Chronic arthralgias:  -Managed by Dr. Luna Glasgow. He manages her pain medications as well.        Dispo:  -CBC and Procrit every 6 weeks. (CBC with diff) -Return to cancer center in 4 months for follow-up with labs and subsequent injection. (CBC with diff, BMP, ferritin, iron/TIBC)   All questions were answered to patient's stated satisfaction. Encouraged patient to call with any new concerns or questions before her next visit to the cancer center and we can certain see her sooner, if needed.    Plan of care discussed with Dr. Talbert Cage, who agrees with the above aforementioned.    Orders placed this encounter:  Orders Placed This Encounter  Procedures  . CBC with Differential/Platelet  . Basic metabolic panel  . Ferritin  . Iron and TIBC  . CBC with Differential/Platelet      Mike Craze, NP [place] [phone]

## 2017-05-16 NOTE — Progress Notes (Signed)
[name] [name] presents today for injection per MD orders. Procrit 40,000 units administered SQ in right Abdomen. Administration without incident. Patient tolerated well. Stable on discharge home via wheelchair with niece.

## 2017-05-16 NOTE — Patient Instructions (Signed)
[place] at [facility] Discharge Instructions  RECOMMENDATIONS MADE BY THE CONSULTANT AND ANY TEST RESULTS WILL BE SENT TO YOUR REFERRING PHYSICIAN.  Procrit 40,000 units injection given as ordered.  Thank you for choosing [place] at [facility] to provide your oncology and hematology care.  To afford each patient quality time with our provider, please arrive at least 15 minutes before your scheduled appointment time.    If you have a lab appointment with the [place] please come in thru the  Main Entrance and check in at the main information desk  You need to re-schedule your appointment should you arrive 10 or more minutes late.  We strive to give you quality time with our providers, and arriving late affects you and other patients whose appointments are after yours.  Also, if you no show three or more times for appointments you may be dismissed from the clinic at the providers discretion.     Again, thank you for choosing [facility].  Our hope is that these requests will decrease the amount of time that you wait before being seen by our physicians.       _____________________________________________________________  Should you have questions after your visit to [facility], please contact our office at [phone] between the hours of 8:30 a.m. and 4:30 p.m.  Voicemails left after 4:30 p.m. will not be returned until the following business day.  For prescription refill requests, have your pharmacy contact our office.       Resources For Cancer Patients and their Caregivers ? American Cancer Society: Can assist with transportation, wigs, general needs, runs Look Good Feel Better.        210 271 4083 ? Cancer Care: Provides financial assistance, online support groups, medication/co-pay assistance.  1-800-813-HOPE (236)482-7538) ? [place] Assists [place] cancer patients and  their families through emotional , educational and financial support.  803-094-0017 ? [place] DSS Where to apply for food stamps, Medicaid and utility assistance. 531-106-4505 ? [employer]: Transportation to medical appointments. 425-527-4213 ? Social Security Administration: May apply for disability if have a Stage IV cancer. 331-780-2830 249 846 4932 ? [employer], Disability and Transit Services: Assists with nutrition, care and transit needs. [place] [facility] Support Programs: @10RELATIVEDAYS @ > Cancer Support Group  2nd Tuesday of the month 1pm-2pm, Journey Room  > Creative Journey  3rd Tuesday of the month 1130am-1pm, Journey Room  > Look Good Feel Better  1st Wednesday of the month 10am-12 noon, Journey Room (Call [place] to register [phone])

## 2017-05-16 NOTE — Patient Instructions (Addendum)
[place] at [facility] Discharge Instructions  RECOMMENDATIONS MADE BY THE CONSULTANT AND ANY TEST RESULTS WILL BE SENT TO YOUR REFERRING PHYSICIAN.  You were seen today by Mike Craze, NP We are going to be scheduling your Procrit injections out every 6 weeks with lab work Follow up in 4 months with lab work See schedulers up front for appointments    Thank you for choosing [place] at [facility] to provide your oncology and hematology care.  To afford each patient quality time with our provider, please arrive at least 15 minutes before your scheduled appointment time.    If you have a lab appointment with the [place] please come in thru the  Main Entrance and check in at the main information desk  You need to re-schedule your appointment should you arrive 10 or more minutes late.  We strive to give you quality time with our providers, and arriving late affects you and other patients whose appointments are after yours.  Also, if you no show three or more times for appointments you may be dismissed from the clinic at the providers discretion.     Again, thank you for choosing [facility].  Our hope is that these requests will decrease the amount of time that you wait before being seen by our physicians.       _____________________________________________________________  Should you have questions after your visit to [facility], please contact our office at [phone] between the hours of 8:30 a.m. and 4:30 p.m.  Voicemails left after 4:30 p.m. will not be returned until the following business day.  For prescription refill requests, have your pharmacy contact our office.       Resources For Cancer Patients and their Caregivers ? American Cancer Society: Can assist with transportation, wigs, general needs, runs Look Good Feel Better.        901 849 6233 ? Cancer Care: Provides financial  assistance, online support groups, medication/co-pay assistance.  1-800-813-HOPE 671-025-9211) ? [place] Assists [place] cancer patients and their families through emotional , educational and financial support.  250-681-6052 ? [place] DSS Where to apply for food stamps, Medicaid and utility assistance. 825-769-5563 ? RCATS: Transportation to medical appointments. (571) 175-8931 ? Social Security Administration: May apply for disability if have a Stage IV cancer. 712-369-2166 2120357941 ? [employer], Disability and Transit Services: Assists with nutrition, care and transit needs. [place] Support Programs: @10RELATIVEDAYS @ > Cancer Support Group  2nd Tuesday of the month 1pm-2pm, Journey Room  > Creative Journey  3rd Tuesday of the month 1130am-1pm, Journey Room  > Look Good Feel Better  1st Wednesday of the month 10am-12 noon, Journey Room (Call [place] to register [phone])

## 2017-05-18 ENCOUNTER — Telehealth: Payer: Self-pay | Admitting: *Deleted

## 2017-05-18 MED ORDER — COUMADIN 5 MG PO TABS: ORAL_TABLET | ORAL | 1 refills | Status: DC

## 2017-05-18 NOTE — Telephone Encounter (Signed)
Needs RX for Warfarin sent to mail order pharmacy [facility]) / tg

## 2017-05-21 ENCOUNTER — Encounter (HOSPITAL_COMMUNITY): Payer: Medicare Other

## 2017-05-21 ENCOUNTER — Other Ambulatory Visit: Payer: Self-pay | Admitting: Licensed Clinical Social Worker

## 2017-05-21 ENCOUNTER — Ambulatory Visit (HOSPITAL_COMMUNITY)
Admission: RE | Admit: 2017-05-21 | Discharge: 2017-05-21 | Disposition: A | Discharge status: Home / Self Care | Payer: Medicare Other | Source: Ambulatory Visit | Attending: Family Medicine | Admitting: Family Medicine

## 2017-05-21 DIAGNOSIS — Z1231 Encounter for screening mammogram for malignant neoplasm of breast: Secondary | ICD-10-CM

## 2017-05-21 DIAGNOSIS — E611 Iron deficiency: Secondary | ICD-10-CM | POA: Diagnosis not present

## 2017-05-21 DIAGNOSIS — D631 Anemia in chronic kidney disease: Secondary | ICD-10-CM | POA: Diagnosis not present

## 2017-05-21 DIAGNOSIS — N184 Chronic kidney disease, stage 4 (severe): Secondary | ICD-10-CM | POA: Diagnosis not present

## 2017-05-21 LAB — IRON AND TIBC
Iron: 56 ug/dL (ref 28–170)
Saturation Ratios: 18 % (ref 10.4–31.8)
TIBC: 311 ug/dL (ref 250–450)
UIBC: 255 ug/dL

## 2017-05-21 NOTE — Patient Outreach (Signed)
Assessment:  CSW spoke via phone with client. CSW verified client identity.CSW and client spoke of client needs. Client sees Dr. [name] as primary care doctor. Client has prescribed medications and is taking medications as prescribed. Client has been receiving [facility] nursing care with RN Joellyn Quails.  Client said that RN Joellyn Quails is scheduled to make home visit with client on 05/31/17. Client uses a cane to assist her with ambulation.  Client said that her family members help in transporting client to and from client's scheudled medical appointments. Client uses oxygen as prescribed at night to assist client with breathing.  Client said she has her next appointment with Dr. [name] in October of 2018.  CSW spoke with client about client care plan. CSW encouraged client to communicate with CSW in next 30 days to discuss community resources of assistance for client.  Client said she receives some help from her nephew. She also has help from her niece who helps her obtain needed grocery/food items for client. Client said she has not had any recent falls. She said she had her prescribed medications. She said she was scheduled for an appointment today for a mammogram. She said she still fatigues easily and has to take periodic rest breaks to manage fatigue. She has some difficulty sleeping and has talked with Dr. [name] about sleeping issues for client. She said she see Dr. Luna Glasgow, orthopedist, for pain management issues for client. CSW spoke with Lian[place] about [facility] program support in nursing and social work. She is appreciative of [facility] program support she receives. CSW thanked [place] for phone call with CSW on 05/21/17. CSW encouraged [place] to call CSW at [phone] as needed to discuss social work needs of client. Client was appreciative of call from [place] on 05/21/17.  Plan:  Client to communicate with CSW in next 30 days to discuss community resources of assistance for client.  CSW to collaborate with RN Joellyn Quails in monitoring needs of client.   CSW to call client in 4 weeks to assess client needs at that time.   [name].Miyani Cronic MSW, LCSW Licensed Clinical Social Worker [facility] Care Management [phone]

## 2017-05-22 ENCOUNTER — Telehealth: Payer: Self-pay | Admitting: Orthopaedic Surgery

## 2017-05-22 MED ORDER — HYDROCODONE-ACETAMINOPHEN 7.5-325 MG PO TABS: 1.0000 | ORAL_TABLET | Freq: Four times a day (QID) | ORAL | 0 refills | Status: DC | PRN

## 2017-05-22 NOTE — Telephone Encounter (Signed)
Patient requests refill on Hydrocodone/Acetaminophen 7.5-325   Mgs.   Qty  55   Sig: Take 1 tablet by mouth every 6 (six) hours as needed for moderate pain (Must last 30 days.Do not drive or operate machinery while taking this medicine.).

## 2017-05-23 ENCOUNTER — Ambulatory Visit (INDEPENDENT_AMBULATORY_CARE_PROVIDER_SITE_OTHER): Payer: Medicare Other | Admitting: *Deleted

## 2017-05-23 ENCOUNTER — Other Ambulatory Visit: Payer: Self-pay | Admitting: Family Medicine

## 2017-05-23 DIAGNOSIS — I255 Ischemic cardiomyopathy: Secondary | ICD-10-CM

## 2017-05-23 DIAGNOSIS — Z952 Presence of prosthetic heart valve: Secondary | ICD-10-CM | POA: Diagnosis not present

## 2017-05-23 DIAGNOSIS — H0520 Unspecified exophthalmos: Secondary | ICD-10-CM | POA: Diagnosis not present

## 2017-05-23 DIAGNOSIS — Z5181 Encounter for therapeutic drug level monitoring: Secondary | ICD-10-CM | POA: Diagnosis not present

## 2017-05-23 LAB — POCT INR: INR: 2.7

## 2017-05-29 DIAGNOSIS — L603 Nail dystrophy: Secondary | ICD-10-CM | POA: Diagnosis not present

## 2017-05-29 DIAGNOSIS — E1142 Type 2 diabetes mellitus with diabetic polyneuropathy: Secondary | ICD-10-CM | POA: Diagnosis not present

## 2017-05-29 DIAGNOSIS — L851 Acquired keratosis [keratoderma] palmaris et plantaris: Secondary | ICD-10-CM | POA: Diagnosis not present

## 2017-05-31 ENCOUNTER — Other Ambulatory Visit: Payer: Self-pay | Admitting: *Deleted

## 2017-05-31 NOTE — Patient Outreach (Signed)
[facility]) Care Management   05/31/2017  [name] [name] May 19, 1932 [id number]  [name] [name] is an 83 y.o. 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  [facility] CM referral received from [place] CM in April 2018 to continue to follow for [facility] and home visit for May 2018. She had been hospitalized 2/22-2/23/18 for exacerbation CHF and was previously active with [facility] RN health coach, [name] [name] has not been hospitalized in the last 4 months and is doing well with home care management She is now being followed for [facility] CM complex nursing services    Subjective: see notes below  Objective:   Review of Systems  Constitutional: Negative for chills, diaphoresis, fever, malaise/fatigue and weight loss.       Reports weight gain of "ten pounds"   HENT: Negative for congestion, ear discharge, ear pain, hearing loss, nosebleeds and tinnitus.   Eyes: Negative.   Respiratory: Negative.   Cardiovascular: Positive for leg swelling.       Bilateral ankle swelling  Gastrointestinal: Negative.   Genitourinary: Negative.   Musculoskeletal: Positive for joint pain and myalgias.  Skin: Negative.  Negative for itching and rash.  Neurological: Positive for weakness. Negative for dizziness, tingling, tremors, sensory change, speech change, focal weakness, seizures, loss of consciousness and headaches.  Endo/Heme/Allergies: Negative.   Psychiatric/Behavioral: Negative.     Physical Exam  Constitutional: She is oriented to person, place, and time. She appears well-developed and well-nourished.  HENT:  Head: Normocephalic and atraumatic.  Eyes: Pupils are equal, round, and reactive to light. Conjunctivae are normal.  Neck: Normal  range of motion. Neck supple.  Respiratory: Effort normal and breath sounds normal.  GI: Soft. Bowel sounds are normal.  Neurological: She is alert and oriented to person, place, and time.  Skin: Skin is warm and dry.    Encounter Medications:   Outpatient Encounter Prescriptions as of 05/31/2017  Medication Sig Note  . acetaminophen (TYLENOL) 500 MG tablet Take 500 mg by mouth every 6 (six) hours as needed (pain).   Marland Kitchen albuterol (PROVENTIL) (2.5 MG/3ML) 0.083% nebulizer solution INHALE 1 VIAL VIA NEBULIZER THREE TIMES DAILY. (Patient taking differently: INHALE 1 VIAL VIA NEBULIZER THREE TIMES DAILY AS NEEDED)   . albuterol (VENTOLIN HFA) 108 (90 Base) MCG/ACT inhaler Inhale 2 puffs into the lungs every 6 (six) hours as needed for wheezing or shortness of breath.   . allopurinol (ZYLOPRIM) 100 MG tablet Take 1 tablet (100 mg total) by mouth daily.   Marland Kitchen azelastine (ASTELIN) 0.1 % nasal spray Place 2 sprays into both nostrils 2 (two) times daily. Use in each nostril as directed 01/23/2017: uses as needed  . calcitRIOL (ROCALTROL) 0.25 MCG capsule Take 0.25 mcg by mouth daily.    . cholecalciferol (VITAMIN [name]) 1000 UNITS tablet Take 1,000 Units by mouth daily.   Marland Kitchen COUMADIN 5 MG tablet Take 2 tablets daily except 1 tablet on Tuesdays   . digoxin (LANOXIN) 0.125 MG tablet Take 1 tablet (0.125 mg total) by mouth every other day.   . docusate sodium (COLACE) 100 MG capsule Take 300 mg by mouth at bedtime.   . fluticasone (FLONASE) 50 MCG/ACT nasal spray USE 2 SPRAYS IN EACH       NOSTRIL ONCE DAILY (Patient taking differently: USE 2  SPRAYS IN EACH       NOSTRIL ONCE DAILY AS NEEDED FOR CONGESTION)   . folic acid (FOLVITE) 1 MG tablet Take 1 tablet (1 mg total) by mouth daily.   . furosemide (LASIX) 40 MG tablet Take 1 tablet by mouth  daily   . gabapentin (NEURONTIN) 100 MG capsule    . gabapentin (NEURONTIN) 300 MG capsule Take 1 capsule (300 mg total) by mouth at bedtime.   . hydrALAZINE (APRESOLINE) 25  MG tablet Take 1 tablet (25 mg total) by mouth 3 (three) times daily.   Marland Kitchen HYDROcodone-acetaminophen (NORCO) 7.5-325 MG tablet Take 1 tablet by mouth every 6 (six) hours as needed for moderate pain (Must last 30 days.Do not drive or operate machinery while taking this medicine.).   Marland Kitchen isosorbide mononitrate (IMDUR) 60 MG 24 hr tablet Take 1 tablet (60 mg total) by mouth daily.   Marland Kitchen losartan (COZAAR) 50 MG tablet take 1 tablet by mouth once daily   . meclizine (ANTIVERT) 25 MG tablet Take 1 tablet (25 mg total) by mouth 3 (three) times daily as needed for dizziness.   . metolazone (ZAROXOLYN) 2.5 MG tablet Take 1 tablet (2.5 mg total) by mouth once a week. 01/23/2017: Get in mail order use only on wednesdays   . ondansetron (ZOFRAN) 4 MG tablet Take 1 tablet (4 mg total) by mouth daily as needed for nausea or vomiting.   . ondansetron (ZOFRAN) 4 MG tablet take 1 tablet once daily if needed for nausea and vomiting   . Polyethyl [name] (SYSTANE OP) Place 1 drop into both eyes daily as needed (dry eyes).   . potassium chloride SA (KLOR-CON M20) 20 MEQ tablet Take 1 tablet by mouth  daily   . pravastatin (PRAVACHOL) 20 MG tablet take 1 tablet by mouth once daily   . promethazine-dextromethorphan (PROMETHAZINE-DM) 6.25-15 MG/5ML syrup One teaspoon at bedtime, as needed, for excessive cough (Patient taking differently: Take 5 mLs by mouth at bedtime as needed for cough. as needed, for excessive cough)   . SYNTHROID 75 MCG tablet Take 1 tablet by mouth  daily   . TOPROL XL 100 MG 24 hr tablet Take 1 tablet (100 mg total) by mouth daily. Take with or immediately following a meal.   . UNABLE TO FIND Walker with seat x 1    Facility-Administered Encounter Medications as of 05/31/2017  Medication  . epoetin alfa (EPOGEN,PROCRIT) injection 40,000 Units    Functional Status:   In your present state of health, do you have any difficulty performing the following activities: 04/26/2017 03/15/2017   Hearing? N N  Vision? N N  Difficulty concentrating or making decisions? N N  Walking or climbing stairs? N N  Comment - -  Dressing or bathing? N N  Doing errands, shopping? N [place] and eating ? N N  Using the Toilet? N N  In the past six months, have you accidently leaked urine? N N  Do you have problems with loss of bowel control? N N  Managing your Medications? N N  Managing your Finances? N N  Housekeeping or managing your Housekeeping? N N  Some recent data might be hidden    Fall/Depression Screening:    Fall Risk  04/26/2017 03/15/2017 03/12/2017  Falls in the past year? No No No  Number falls in past yr: - - -  Injury with Fall? - - -  Risk for fall due to : Impaired  balance/gait;Impaired mobility;Medication side effect Impaired mobility;Medication side effect;Impaired balance/gait Impaired balance/gait;Impaired mobility  Follow up - - -   PHQ 2/9 Scores 04/26/2017 03/20/2017 03/15/2017 03/12/2017 11/30/2016 11/03/2016 10/13/2016  PHQ - 2 Score 0 0 0 0 1 0 0  PHQ- 9 Score - - - - - - -    Assessment:    Met with Mrs Yerkes in her living room Noted pain grimace when she sat down.  Upcoming appointments Dentist to be seen on 07/04/17 at 1 pm with Dr Thurnell Lose dentist [address] [place], [place] [address]  [phone]  [place] [address] after she weighed herself today  Last Wednesday 05/23/17 she went to the coumadin clinic-- INR 2. Told to eat greens" Coumadin now being taken 2 tabs qd and 1 tab Tuesday and Saturday     On 05/29/17 she saw her podiatrist   To See Dr Georjean Mode in [place]  08/28/17  at 1115 to evaluate again if she will have the left repaired   Plan:  Reviewed appointments in [facility] for September, October  [facility] CM & Mrs Nowaczyk called Dr Donetta Potts office to confirm no follow up appointment and to inform Raeford Razor that she has seen Dr Lovena Le on 04/23/17 last   [place]. Lavina [name], RN, BSN, [facility] [phone]

## 2017-06-05 ENCOUNTER — Ambulatory Visit: Payer: Medicare Other | Admitting: Family

## 2017-06-05 ENCOUNTER — Other Ambulatory Visit: Payer: Self-pay | Admitting: Family Medicine

## 2017-06-05 ENCOUNTER — Other Ambulatory Visit (HOSPITAL_COMMUNITY): Payer: Medicare Other

## 2017-06-06 ENCOUNTER — Ambulatory Visit (HOSPITAL_COMMUNITY): Payer: Medicare Other

## 2017-06-06 ENCOUNTER — Other Ambulatory Visit (HOSPITAL_COMMUNITY): Payer: Medicare Other

## 2017-06-13 ENCOUNTER — Ambulatory Visit (INDEPENDENT_AMBULATORY_CARE_PROVIDER_SITE_OTHER): Payer: Medicare Other | Admitting: *Deleted

## 2017-06-13 DIAGNOSIS — Z5181 Encounter for therapeutic drug level monitoring: Secondary | ICD-10-CM | POA: Diagnosis not present

## 2017-06-13 DIAGNOSIS — Z952 Presence of prosthetic heart valve: Secondary | ICD-10-CM

## 2017-06-13 LAB — POCT INR: INR: 2.9

## 2017-06-19 ENCOUNTER — Other Ambulatory Visit: Payer: Self-pay | Admitting: Family Medicine

## 2017-06-20 ENCOUNTER — Emergency Department (HOSPITAL_COMMUNITY): Payer: Medicare Other

## 2017-06-20 ENCOUNTER — Encounter (HOSPITAL_COMMUNITY): Payer: Self-pay | Admitting: Emergency Medicine

## 2017-06-20 ENCOUNTER — Emergency Department (HOSPITAL_COMMUNITY)
Admission: EM | Admit: 2017-06-20 | Discharge: 2017-06-20 | Disposition: A | Discharge status: Home / Self Care | Payer: Medicare Other | Attending: Emergency Medicine | Admitting: Emergency Medicine

## 2017-06-20 ENCOUNTER — Other Ambulatory Visit: Payer: Self-pay

## 2017-06-20 DIAGNOSIS — Z9981 Dependence on supplemental oxygen: Secondary | ICD-10-CM | POA: Diagnosis not present

## 2017-06-20 DIAGNOSIS — N184 Chronic kidney disease, stage 4 (severe): Secondary | ICD-10-CM | POA: Diagnosis not present

## 2017-06-20 DIAGNOSIS — Z955 Presence of coronary angioplasty implant and graft: Secondary | ICD-10-CM | POA: Insufficient documentation

## 2017-06-20 DIAGNOSIS — Z95810 Presence of automatic (implantable) cardiac defibrillator: Secondary | ICD-10-CM | POA: Diagnosis not present

## 2017-06-20 DIAGNOSIS — J449 Chronic obstructive pulmonary disease, unspecified: Secondary | ICD-10-CM | POA: Diagnosis not present

## 2017-06-20 DIAGNOSIS — Z7902 Long term (current) use of antithrombotics/antiplatelets: Secondary | ICD-10-CM | POA: Insufficient documentation

## 2017-06-20 DIAGNOSIS — Z7901 Long term (current) use of anticoagulants: Secondary | ICD-10-CM | POA: Insufficient documentation

## 2017-06-20 DIAGNOSIS — Z87891 Personal history of nicotine dependence: Secondary | ICD-10-CM | POA: Diagnosis not present

## 2017-06-20 DIAGNOSIS — I130 Hypertensive heart and chronic kidney disease with heart failure and stage 1 through stage 4 chronic kidney disease, or unspecified chronic kidney disease: Secondary | ICD-10-CM | POA: Diagnosis not present

## 2017-06-20 DIAGNOSIS — I5022 Chronic systolic (congestive) heart failure: Secondary | ICD-10-CM | POA: Insufficient documentation

## 2017-06-20 DIAGNOSIS — R0602 Shortness of breath: Secondary | ICD-10-CM | POA: Insufficient documentation

## 2017-06-20 DIAGNOSIS — R531 Weakness: Secondary | ICD-10-CM | POA: Insufficient documentation

## 2017-06-20 DIAGNOSIS — E890 Postprocedural hypothyroidism: Secondary | ICD-10-CM | POA: Diagnosis not present

## 2017-06-20 DIAGNOSIS — Z79899 Other long term (current) drug therapy: Secondary | ICD-10-CM | POA: Diagnosis not present

## 2017-06-20 LAB — URINALYSIS, ROUTINE W REFLEX MICROSCOPIC
Bilirubin Urine: NEGATIVE
Glucose, UA: NEGATIVE mg/dL
Hgb urine dipstick: NEGATIVE
Ketones, ur: NEGATIVE mg/dL
Leukocytes, UA: NEGATIVE
Nitrite: NEGATIVE
Protein, ur: NEGATIVE mg/dL
Specific Gravity, Urine: 1.008 (ref 1.005–1.030)
pH: 5 (ref 5.0–8.0)

## 2017-06-20 LAB — CBC WITH DIFFERENTIAL/PLATELET
Basophils Absolute: 0 10*3/uL (ref 0.0–0.1)
Basophils Relative: 0 %
Eosinophils Absolute: 0 10*3/uL (ref 0.0–0.7)
Eosinophils Relative: 0 %
HCT: 31.2 % — ABNORMAL LOW (ref 36.0–46.0)
Hemoglobin: 9.8 g/dL — ABNORMAL LOW (ref 12.0–15.0)
Lymphocytes Relative: 11 %
Lymphs Abs: 0.7 10*3/uL (ref 0.7–4.0)
MCH: 30.1 pg (ref 26.0–34.0)
MCHC: 31.4 g/dL (ref 30.0–36.0)
MCV: 95.7 fL (ref 78.0–100.0)
Monocytes Absolute: 0.7 10*3/uL (ref 0.1–1.0)
Monocytes Relative: 10 %
Neutro Abs: 5.4 10*3/uL (ref 1.7–7.7)
Neutrophils Relative %: 79 %
Platelets: 188 10*3/uL (ref 150–400)
RBC: 3.26 MIL/uL — ABNORMAL LOW (ref 3.87–5.11)
RDW: 16.1 % — ABNORMAL HIGH (ref 11.5–15.5)
WBC: 6.8 10*3/uL (ref 4.0–10.5)

## 2017-06-20 LAB — BASIC METABOLIC PANEL
Anion gap: 8 (ref 5–15)
BUN: 39 mg/dL — ABNORMAL HIGH (ref 6–20)
CO2: 29 mmol/L (ref 22–32)
Calcium: 9.5 mg/dL (ref 8.9–10.3)
Chloride: 98 mmol/L — ABNORMAL LOW (ref 101–111)
Creatinine, Ser: 1.69 mg/dL — ABNORMAL HIGH (ref 0.44–1.00)
GFR calc Af Amer: 31 mL/min — ABNORMAL LOW (ref >60)
GFR calc non Af Amer: 27 mL/min — ABNORMAL LOW (ref >60)
Glucose, Bld: 126 mg/dL — ABNORMAL HIGH (ref 65–99)
Potassium: 4.9 mmol/L (ref 3.5–5.1)
Sodium: 135 mmol/L (ref 135–145)

## 2017-06-20 LAB — OCCULT BLOOD, POC DEVICE: Fecal Occult Bld: NEGATIVE

## 2017-06-20 LAB — CBG MONITORING, ED: Glucose-Capillary: 124 mg/dL — ABNORMAL HIGH (ref 65–99)

## 2017-06-20 LAB — BRAIN NATRIURETIC PEPTIDE: B Natriuretic Peptide: 251 pg/mL — ABNORMAL HIGH (ref 0.0–100.0)

## 2017-06-20 LAB — DIGOXIN LEVEL: Digoxin Level: 0.6 ng/mL — ABNORMAL LOW (ref 0.8–2.0)

## 2017-06-20 LAB — TROPONIN I: Troponin I: 0.03 ng/mL (ref <0.03)

## 2017-06-20 MED ORDER — IPRATROPIUM-ALBUTEROL 0.5-2.5 (3) MG/3ML IN SOLN
3.0000 mL | Freq: Once | RESPIRATORY_TRACT | Status: AC
  Administered 2017-06-20: 3 mL via RESPIRATORY_TRACT
  Filled 2017-06-20: qty 3

## 2017-06-20 MED ORDER — PREDNISONE 50 MG PO TABS
60.0000 mg | ORAL_TABLET | Freq: Once | ORAL | Status: AC
  Administered 2017-06-20: 60 mg via ORAL
  Filled 2017-06-20: qty 1

## 2017-06-20 MED ORDER — FUROSEMIDE 10 MG/ML IJ SOLN
80.0000 mg | Freq: Once | INTRAMUSCULAR | Status: AC
  Administered 2017-06-20: 80 mg via INTRAVENOUS
  Filled 2017-06-20: qty 8

## 2017-06-20 MED ORDER — DOXYCYCLINE HYCLATE 100 MG PO CAPS: 100.0000 mg | ORAL_CAPSULE | Freq: Two times a day (BID) | ORAL | 0 refills | Status: DC

## 2017-06-20 MED ORDER — IPRATROPIUM-ALBUTEROL 0.5-2.5 (3) MG/3ML IN SOLN
3.0000 mL | RESPIRATORY_TRACT | Status: AC
  Administered 2017-06-20 (×3): 3 mL via RESPIRATORY_TRACT
  Filled 2017-06-20: qty 9

## 2017-06-20 MED ORDER — PREDNISONE 20 MG PO TABS
ORAL_TABLET | ORAL | 0 refills | Status: DC
Start: 2017-06-20 — End: 2017-06-28

## 2017-06-20 MED ORDER — DOXYCYCLINE HYCLATE 100 MG PO TABS
100.0000 mg | ORAL_TABLET | Freq: Once | ORAL | Status: AC
  Administered 2017-06-20: 100 mg via ORAL
  Filled 2017-06-20: qty 1

## 2017-06-20 NOTE — ED Triage Notes (Signed)
Pt c/o gen weakness, sob x 1 week ago. Chest tightness x 3 days. Denies sweating/vomiting/dizziness/cough. C/o some nausea. Mild labored breathing noted. Denies gu sx.

## 2017-06-20 NOTE — ED Notes (Signed)
CRITICAL VALUE ALERT  Critical Value:  Troponin 0.04  Date & Time Notied:  06/20/2017 1424  Provider Notified:  [place]  Orders Received/Actions taken: No orders received at this time

## 2017-06-20 NOTE — ED Provider Notes (Signed)
Emergency Department Provider Note   I have reviewed the triage vital signs and the nursing notes.   HISTORY  Chief Complaint Weakness and Shortness of Breath   HPI [name] [name] is a 83 y.o. female with multiple medical problems but most importantly CHF, COPD and diabetes presents the emergency department today with 3 weeks of intermittent weakness and shortness of breath associated with some cough. Patient states that his been going on for a few weeks however the last week she is increased her Lasix and this seems to have helped some but she still feels a little bit weaker than she thinks is normal so thought it was time to come get checked out. She does note some shortness of breath. Mild sharp chest pain. Once again she doubled her "fluid pills" which seemed to help quite a bit which has not tried using any inhalers to help with that.   Past Medical History:  Diagnosis Date  . Adenomatous polyp of colon 12/16/2002   Dr. [name]/[facility]  . Allergy   . Cataract   . CHB (complete heart block) ([place]) 10/07/2014      . CHF (congestive heart failure) ([place])       . Chronic kidney disease, stage I 2006  . Constipation       . COPD (chronic obstructive pulmonary disease) ([place])       . DJD (degenerative joint disease) of lumbar spine   . DM (diabetes mellitus) ([place]) 2006  . Esophageal reflux   . Glaucoma   . Gout       . Heart murmur   . Hyperlipemia   . IDA (iron deficiency anemia)    Parenteral iron/[name]  . Insomnia       . Non-ischemic cardiomyopathy ([place])    a. s/p MDT CRTD  . Obesity, unspecified   . Other and unspecified hyperlipidemia   . Oxygen deficiency 2014   nocturnal;  . Spondylolisthesis   . Spondylosis   . Thyroid cancer ([place])    remote thyroidectomy, no recurrence, pt denies in 2016 thyroid cancer  . Unspecified hypothyroidism       . 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  Vertigo         Patient Active Problem List   Diagnosis Date Noted  . Trochanteric bursitis, right hip 03/07/2017  . SOB (shortness of breath) 11/16/2016  . Acute on chronic systolic CHF (congestive heart failure) ([place]) 11/16/2016  . Persistent atrial fibrillation ([place])   . Nocturnal hypoxia 10/08/2016  . Dependence on nocturnal oxygen therapy 10/08/2016  . Hematuria 03/20/2016  . S/P ICD (internal cardiac defibrillator) procedure 02/08/2015  . Back pain without radiation 10/13/2014  . CHB (complete heart block) ([place]) 10/07/2014  . Fatigue 07/20/2014  . Anemia 06/24/2014  . Osteopenia 02/10/2014  . Encounter for therapeutic drug monitoring 10/29/2013  . Spinal stenosis of lumbar region 02/19/2013  . OA (osteoarthritis) of knee 02/19/2013  . Chronic pain of right knee 01/02/2013  . Hemolytic anemia ([place]) 09/24/2012  . H/O adenomatous polyp of colon 05/24/2012  . High risk medication use 05/24/2012  . COPD (chronic obstructive pulmonary disease) ([place]) 05/19/2012  . Chronic systolic heart failure ([place]) 05/19/2012  . Chronic anticoagulation, on coumadin for Mechanical AVR and MVR 04/01/2012  . Cardiorenal syndrome with renal failure 03/22/2012  . S/P AVR (aortic valve replacement) 03/22/2012  . S/P MVR (mitral valve replacement) 03/22/2012  . AICD (automatic cardioverter/defibrillator) present 03/22/2012  .  CKD (chronic kidney disease), stage IV ([place]) 03/22/2012  . Warfarin-induced coagulopathy ([place]) 03/21/2012  . Papilloma of breast 03/06/2012  . Iron deficiency 10/04/2011  . Allergic rhinitis 02/14/2011  . Nausea without vomiting 06/07/2010  . HIP PAIN, RIGHT 04/23/2009  . VARICOSE VEINS LOWER EXTREMITIES W/OTH COMPS 02/09/2009  . Calculus of gallbladder with chronic cholecystitis without obstruction 02/09/2009  . Prediabetes 06/15/2008  . Hypothyroidism, postsurgical 02/24/2008  . Hyperlipidemia LDL goal <70 02/24/2008  . Mild obesity 02/24/2008  . Essential hypertension  02/24/2008  . Non-ischemic cardiomyopathy with ICD 02/24/2008  . GERD 02/24/2008  . DEGENERATIVE JOINT DISEASE, SPINE 02/24/2008    Past Surgical History:  Procedure Laterality Date  . AORTIC AND MITRAL VALVE REPLACEMENT  2001  . BI-VENTRICULAR IMPLANTABLE CARDIOVERTER DEFIBRILLATOR UPGRADE N/A 01/18/2015   Procedure: BI-VENTRICULAR IMPLANTABLE CARDIOVERTER DEFIBRILLATOR UPGRADE;  Surgeon: Evans Lance, MD;  Location: [facility] CATH LAB;  Service: Cardiovascular;  Laterality: N/A;  . CARDIAC CATHETERIZATION    . CARDIAC VALVE REPLACEMENT    . CARDIOVERSION N/A 11/13/2016   Procedure: CARDIOVERSION;  Surgeon: Sanda Klein, MD;  Location: [facility] ENDOSCOPY;  Service: Cardiovascular;  Laterality: N/A;  . COLONOSCOPY  06/12/2007   Rourk- Normal rectum/Normal colon  . COLONOSCOPY  12/16/02   small hemorrhoids  . COLONOSCOPY  06/20/2012   Procedure: COLONOSCOPY;  Surgeon: Daneil Dolin, MD;  Location: [facility] ENDO SUITE;  Service: Endoscopy;  Laterality: N/A;  10:45  . defibrillator implanted 2006    . DOPPLER ECHOCARDIOGRAPHY  2012  . DOPPLER ECHOCARDIOGRAPHY  05,06,07,08,09,2011  . ESOPHAGOGASTRODUODENOSCOPY  06/12/2007   Rourk- normal esophagus, small hiatal hernia, otherwise normal stomach, D1, D2  . EYE SURGERY Right 2005  . EYE SURGERY Left 2006  . ICD LEAD REMOVAL Left 02/08/2015   Procedure: ICD LEAD REMOVAL;  Surgeon: Evans Lance, MD;  Location: [facility] OR;  Service: Cardiovascular;  Laterality: Left;  "Will plan extraction and insertion of a BiV PM"  **Dr. Roxan Hockey backing up case**  . IMPLANTABLE CARDIOVERTER DEFIBRILLATOR (ICD) GENERATOR CHANGE Left 02/08/2015   Procedure: ICD GENERATOR CHANGE;  Surgeon: Evans Lance, MD;  Location: [facility];  Service: Cardiovascular;  Laterality: Left;  . INSERT / REPLACE / REMOVE PACEMAKER    . PACEMAKER INSERTION  May 2016  . pacemaker placed  2004  . right breast cyst removed     benign   . right cataract removed     2005  . TEE WITHOUT CARDIOVERSION  N/A 11/13/2016   Procedure: TRANSESOPHAGEAL ECHOCARDIOGRAM (TEE);  Surgeon: Sanda Klein, MD;  Location: [facility] ENDOSCOPY;  Service: Cardiovascular;  Laterality: N/A;  . THYROIDECTOMY      Current Outpatient Rx  . Order #: [id number] Class: Historical Med  . Order #: [id number] Class: Normal  . Order #: [id number] Class: Normal  . Order #: [id number] Class: Normal  . Order #: [id number] Class: Normal  . Order #: [id number] Class: Historical Med  . Order #: [id number] Class: Historical Med  . Order #: [id number] Class: Normal  . Order #: [id number] Class: Normal  . Order #: [id number] Class: Historical Med  . Order #: [id number] Class: Normal  . Order #: [id number] Class: Normal  . Order #: [id number] Class: Normal  . Order #: [id number] Class: Print  . Order #: [id number] Class: Normal  . Order #: [id number] Class: Print  . Order #: [id number] Class: Normal  . Order #: [id number] Class: Normal  . Order #: [id number] Class: Normal  . Order #: [id number] Class: Normal  . Order #: [id number] Class: Normal  . Order #: [id number] Class: Historical Med  . Order #: [id number] Class: Normal  .  Order #: [id number] Class: Normal  . Order #: [id number] Class: Normal  . Order #: [id number] Class: Normal  . Order #: [id number] Class: Normal  . Order #: [id number] Class: Print  . Order #: [id number] Class: Print  . Order #: [id number] Class: Print    Allergies Penicillins; Aspirin; Fentanyl; and Niacin  Family History  Problem Relation Age of Onset  . Pancreatic cancer Mother   . Heart disease Father   . Heart disease Sister   . Heart attack Sister   . Heart disease Brother   . Diabetes Brother   . Heart disease Brother   . Diabetes Brother   . Hypertension Brother   . Lung cancer Brother     Social History Social History  Substance Use Topics  . Smoking status: Former Smoker    Packs/day: 0.75    Years: 40.00    Types: Cigarettes    Quit date: 03/08/1987  . Smokeless tobacco: Never Used  . Alcohol use No    Review of  Systems  All other systems negative except as documented in the HPI. All pertinent positives and negatives as reviewed in the HPI. ____________________________________________   PHYSICAL EXAM:  VITAL SIGNS: ED Triage Vitals  Enc Vitals Group     BP 06/20/17 1217 139/60     Pulse Rate 06/20/17 1217 69     Resp 06/20/17 1217 (!) 24     Temp 06/20/17 1217 98.5 F (36.9 C)     Temp Source 06/20/17 1217 Oral     SpO2 06/20/17 1217 91 %     Weight 06/20/17 1217 225 lb (102.1 kg)     Height 06/20/17 1217 5\' 7"  (1.702 m)     Pain Score 06/20/17 1216 8    Constitutional: Alert and oriented. Well appearing and in no acute distress. Eyes: Conjunctivae are normal. PERRL. EOMI. Head: Atraumatic. Nose: No congestion/rhinnorhea. Mouth/Throat: Mucous membranes are moist.  Oropharynx non-erythematous. Neck: No stridor.  No meningeal signs.   Cardiovascular: Normal rate, regular rhythm. Good peripheral circulation. Grossly normal heart sounds.   Respiratory: Normal respiratory effort.  No retractions. Lungs CTAB. Gastrointestinal: Soft and nontender. No distention.  Musculoskeletal: No lower extremity tenderness nor edema. No gross deformities of extremities. Neurologic:  Normal speech and language. No gross focal neurologic deficits are appreciated.  Skin:  Skin is warm, dry and intact. No rash noted.  ____________________________________________   LABS (all labs ordered are listed, but only abnormal results are displayed)  Labs Reviewed  CBC WITH DIFFERENTIAL/PLATELET - Abnormal; Notable for the following:       Result Value   RBC 3.26 (*)    Hemoglobin 9.8 (*)    HCT 31.2 (*)    RDW 16.1 (*)    All other components within normal limits  BASIC METABOLIC PANEL - Abnormal; Notable for the following:    Chloride 98 (*)    Glucose, Bld 126 (*)    BUN 39 (*)    Creatinine, Ser 1.69 (*)    GFR calc non Af Amer 27 (*)    GFR calc Af Amer 31 (*)    All other components within normal  limits  TROPONIN I - Abnormal; Notable for the following:    Troponin I 0.03 (*)    All other components within normal limits  BRAIN NATRIURETIC PEPTIDE - Abnormal; Notable for the following:    B Natriuretic Peptide 251.0 (*)    All other components within normal limits  DIGOXIN LEVEL - Abnormal; Notable for the following:  Digoxin Level 0.6 (*)    All other components within normal limits  CBG MONITORING, ED - Abnormal; Notable for the following:    Glucose-Capillary 124 (*)    All other components within normal limits  URINALYSIS, ROUTINE W REFLEX MICROSCOPIC  OCCULT BLOOD X 1 CARD TO LAB, STOOL  OCCULT BLOOD, POC DEVICE   ____________________________________________  EKG   EKG Interpretation  Date/Time:  Wednesday June 20 2017 12:24:47 EDT Ventricular Rate:  69 PR Interval:    QRS Duration: 190 QT Interval:  490 QTC Calculation: 525 R Axis:   61 Text Interpretation:  Ventricular-paced rhythm Abnormal ECG underlying aflutter new? Confirmed by Merrily Pew [phone]) on 06/20/2017 4:37:43 PM       ____________________________________________  RADIOLOGY  Dg Chest 2 View  Result Date: 06/20/2017 CLINICAL DATA:  Shortness of breath. EXAM: CHEST  2 VIEW COMPARISON:  Radiographs of November 16, 2016. FINDINGS: Stable cardiomegaly. Status post cardiac valve repair. Atherosclerosis of thoracic aorta is noted. No pneumothorax or pleural effusion is noted. Left-sided pacemaker is unchanged in position. Stable mild central pulmonary vascular congestion is noted. Stable reticular densities are noted throughout both lungs most consistent scarring, but superimposed acute edema cannot be excluded. Bony thorax is unremarkable. IMPRESSION: Aortic atherosclerosis. Stable mild central pulmonary vascular congestion. Stable reticular densities are noted throughout both lungs most consistent with scarring, but superimposed acute edema cannot be excluded. Electronically Signed   By: Marijo Conception, M.D.   On: 06/20/2017 12:52    ____________________________________________   PROCEDURES  Procedure(s) performed:   Procedures   ____________________________________________   INITIAL IMPRESSION / ASSESSMENT AND PLAN / ED COURSE  Pertinent labs & imaging results that were available during my care of the patient were reviewed by me and considered in my medical decision making (see chart for details).  Suspect likely combination of COPD and CHF as she doesn't improve.  Her breathing treatment.She did have some productive cough in the morning so we'll start antibiotics and steroids as well. She'll continue taken extra Lasix and follow up with Dr. [name] to make sure things are continuing to improve. No indication for admission this time she has no respiratory distress, hypoxia or other evidence of severe infection.   ____________________________________________  FINAL CLINICAL IMPRESSION(S) / ED DIAGNOSES  Final diagnoses:  SOB (shortness of breath)     MEDICATIONS GIVEN DURING THIS VISIT:  Medications  doxycycline (VIBRA-TABS) tablet 100 mg (not administered)  predniSONE (DELTASONE) tablet 60 mg (not administered)  ipratropium-albuterol (DUONEB) 0.5-2.5 (3) MG/3ML nebulizer solution 3 mL (3 mLs Nebulization Given 06/20/17 1433)  furosemide (LASIX) injection 80 mg (80 mg Intravenous Given 06/20/17 1559)  ipratropium-albuterol (DUONEB) 0.5-2.5 (3) MG/3ML nebulizer solution 3 mL (3 mLs Nebulization Given 06/20/17 1718)     NEW OUTPATIENT MEDICATIONS STARTED DURING THIS VISIT:  New Prescriptions   DOXYCYCLINE (VIBRAMYCIN) 100 MG CAPSULE    Take 1 capsule (100 mg total) by mouth 2 (two) times daily. One po bid x 7 days   PREDNISONE (DELTASONE) 20 MG TABLET    2 tabs po daily x 4 days    Note:  This document was prepared using Dragon voice recognition software and may include unintentional dictation errors.   Merrily Pew, MD 06/20/17 [phone]

## 2017-06-20 NOTE — ED Notes (Signed)
Pt up to [facility] without difficulty.

## 2017-06-20 NOTE — ED Notes (Signed)
CRITICAL VALUE ALERT  Critical Value: troponin 0.04  Date & Time Notied: 06/20/2017 at 1434  Provider Notified: Dr. Dayna Barker   Orders Received/Actions taken: none

## 2017-06-21 ENCOUNTER — Other Ambulatory Visit: Payer: Self-pay | Admitting: *Deleted

## 2017-06-21 ENCOUNTER — Other Ambulatory Visit: Payer: Self-pay | Admitting: Licensed Clinical Social Worker

## 2017-06-21 NOTE — Patient Outreach (Signed)
Assessment:  CSW spoke via phone with client. CSW verified client identity CSW and client spoke of client needs.  Client sees Dr. Moshe [name] as primary care doctor. Client said she had her prescribed medications and is taking medications as prescribed. Client has been receiving [facility] nursing care with RN [name] [name]. Client uses a cane to assist her in ambulation. She said that her family members transport her to and from her scheduled medical appointments. Client uses oxygen as prescribed at night to assist her with breathiing. CSW and client spoke of client care plan. CSW encouraged client to communicate with CSW in the next 30 days to discuss community resources of assistance for client.  Client said she receives some assistance form her nephew and from her niece. Client said she fatigues easily and has to take periodic rest breaks. Client sees Dr. Luna [name], orthopedist, for pain management assistance for client. Client said her next appointment with Dr. Moshe [name] will be in October of 2018. Client said she went to the local emergency room yesterday for care. She said she was prescribed antibiotics and steroids as part of her treatment. She has support at home. She said she had an adequate food supply.  She said she was appreciative of [place] support with RN [name] [name]. CSW thanked [name] for phone call with CSW on 06/21/17. CSW encouraged [name] [name] to call CSW at [phone] as needed to discuss social work needs of client.  Client was appreciative of call from [name] on 06/21/17.   Plan:  Client to communicate with CSW in the next 30 days to discuss community resources of assistance for client.  CSW to collaborate with RN [name] [name], as needed, in monitoring needs of client.  CSW to call client in 4 weeks to assess client needs at that time.  [name] [name] MSW, LCSW Licensed Clinical Social Worker [facility] Care Management [phone]

## 2017-06-21 NOTE — Patient Outreach (Signed)
[place] [name]) Care Management  06/21/2017  Melodie Ashworth 04-08-1932 [id number]   Care coordination   [name] CM received a voice message from [place] about his call contact with [name] [name] and her recent ED visit Discussed her f/u appt to pcp [name] [name] CM reviewed EPIC notes for 06/20/17 ED visit for sob EDP indicated combination of COPD and CHF issues.  [name] [name] was given prednisone and antibiotics, discharged home and to follow up with pcp [facility] CM sent an in basket message to pcp, [name] [name] and covering MD per in basket message that [name] [name] not available until 101/18.  Message updated on recent ED visit and inquired if pt could/need to be seen earlier than the scheduled 07/11/17 pcp appointment [facility] CM called [name] [name] who reports she will take EDP recommendations and try to see pcp earlier She plans on calling pcp on "monday" Reports she was given IV lasix in ED and feels some better.  Reports she had drunk cranberry apple juice but took more than [name] [name] Cipro recommends "I know I should not have but I did and I increased my fluid medicine.  They said at the [name] I had a little fluid on my lungs but not enough to admit me.  I am glad"  "I think once I feel better maybe next week I would also like to see if [name] [name] can get me a little more therapy out here." Reports she [facility] previously She has all her medications, and states she is reviewing the CHF action plan and trying to comply with it Reports still feeling a little bit weak right now   Plans Pending response from in basket message to pcp/covering MD will update [name] [name] or she will call [facility] CM - about earlier f/u appt and further HHPT via [facility] CM home visit in 2-4 weeks Sent in basket message to [name] [name]/covering MD about [name] [name] request for upcoming HHPT from [facility]  Continue to collaborate with [facility] SW Route not to pcp/covering MD and  other pertinent listed care team members in [place]. Lavina [name], RN, BSN, [place] Care Management [phone]

## 2017-06-22 ENCOUNTER — Other Ambulatory Visit: Payer: Self-pay | Admitting: Family Medicine

## 2017-06-26 ENCOUNTER — Telehealth: Payer: Self-pay | Admitting: Family Medicine

## 2017-06-26 ENCOUNTER — Ambulatory Visit (INDEPENDENT_AMBULATORY_CARE_PROVIDER_SITE_OTHER): Payer: Medicare Other | Admitting: *Deleted

## 2017-06-26 ENCOUNTER — Other Ambulatory Visit: Payer: Self-pay | Admitting: *Deleted

## 2017-06-26 DIAGNOSIS — I5022 Chronic systolic (congestive) heart failure: Secondary | ICD-10-CM

## 2017-06-26 DIAGNOSIS — I255 Ischemic cardiomyopathy: Secondary | ICD-10-CM

## 2017-06-26 NOTE — Telephone Encounter (Signed)
Jackelyn Poling, [place] manager left message regarding patient, she states that she sent an inbasket message to Dr. [name] regarding patient. She states that patient has recently been in ED and wants an earlier appointment and that patient is also wanting [facility] PT for mobility issues.  Callback# [phone]

## 2017-06-26 NOTE — Progress Notes (Signed)
Remote ICD transmission.   

## 2017-06-26 NOTE — Patient Outreach (Signed)
[place] [facility]) Care Management  06/26/2017  Autry Droege Mar 11, 1932 [id number]   Care coordination   [facility] CM receive a call from Mr Vukelich  She nor [facility] CM have had responses from the in basket message sent to pcp and yvonne covering medical provider for earlier appt nor HHPT orders [facility] CM called the pcp office and left a message in triage RN mail box at [phone] requesting possible earlier pcp f/u appt vis 07/11/17 and assist with HHPT orders for mobility issues at home  [facility] CM left CM mobile number and Mrs [place] home number for return call from pcp staff Mrs Pangelinan updated   Plans see Mrs Todorov for home visit in 10-2 weeks and follow up on response from pcp after noting pcp f/u and hhpt requests after transition of care assessment questions answered on 06/21/17   Joelene Millin L. Lavina [name], RN, BSN, [place] Care Management [phone]

## 2017-06-27 ENCOUNTER — Encounter (HOSPITAL_COMMUNITY): Payer: Self-pay

## 2017-06-27 ENCOUNTER — Encounter (HOSPITAL_COMMUNITY): Payer: Medicare Other | Attending: Oncology

## 2017-06-27 ENCOUNTER — Encounter: Payer: Self-pay | Admitting: Cardiology

## 2017-06-27 ENCOUNTER — Telehealth: Payer: Self-pay | Admitting: Family Medicine

## 2017-06-27 ENCOUNTER — Encounter (HOSPITAL_COMMUNITY): Payer: Medicare Other

## 2017-06-27 VITALS — BP 93/51 | HR 71 | Temp 98.2°F | Resp 18

## 2017-06-27 DIAGNOSIS — D649 Anemia, unspecified: Secondary | ICD-10-CM

## 2017-06-27 DIAGNOSIS — E611 Iron deficiency: Secondary | ICD-10-CM | POA: Diagnosis not present

## 2017-06-27 DIAGNOSIS — D631 Anemia in chronic kidney disease: Secondary | ICD-10-CM

## 2017-06-27 DIAGNOSIS — I5022 Chronic systolic (congestive) heart failure: Secondary | ICD-10-CM

## 2017-06-27 DIAGNOSIS — N184 Chronic kidney disease, stage 4 (severe): Secondary | ICD-10-CM

## 2017-06-27 LAB — CBC WITH DIFFERENTIAL/PLATELET
Basophils Absolute: 0 10*3/uL (ref 0.0–0.1)
Basophils Relative: 0 %
Eosinophils Absolute: 0 10*3/uL (ref 0.0–0.7)
Eosinophils Relative: 0 %
HCT: 32.4 % — ABNORMAL LOW (ref 36.0–46.0)
Hemoglobin: 10.4 g/dL — ABNORMAL LOW (ref 12.0–15.0)
Lymphocytes Relative: 13 %
Lymphs Abs: 1.4 10*3/uL (ref 0.7–4.0)
MCH: 30.1 pg (ref 26.0–34.0)
MCHC: 32.1 g/dL (ref 30.0–36.0)
MCV: 93.9 fL (ref 78.0–100.0)
Monocytes Absolute: 1 10*3/uL (ref 0.1–1.0)
Monocytes Relative: 9 %
Neutro Abs: 8.5 10*3/uL — ABNORMAL HIGH (ref 1.7–7.7)
Neutrophils Relative %: 78 %
Platelets: 196 10*3/uL (ref 150–400)
RBC: 3.45 MIL/uL — ABNORMAL LOW (ref 3.87–5.11)
RDW: 16.8 % — ABNORMAL HIGH (ref 11.5–15.5)
WBC: 10.9 10*3/uL — ABNORMAL HIGH (ref 4.0–10.5)

## 2017-06-27 MED ORDER — EPOETIN ALFA 40000 UNIT/ML IJ SOLN
40000.0000 [IU] | Freq: Once | INTRAMUSCULAR | Status: AC
  Administered 2017-06-27: 40000 [IU] via SUBCUTANEOUS
  Filled 2017-06-27: qty 1

## 2017-06-27 NOTE — Telephone Encounter (Signed)
[name] [name] w/[facility]  [phone]   Need faxed order to [facility]  [phone] face to face  Home PT 2x per wk for 4 wks

## 2017-06-27 NOTE — Telephone Encounter (Signed)
appt is being scheduled for am, [facility] aware,  Please refer pt to [facility] for twice weekly physical therapy in home for next 6 weeks, call and let them know I am oKaying this having spokjen to [facility] , they may send in form for me to sign off on this

## 2017-06-27 NOTE — Progress Notes (Signed)
[name] [name] presents today for injection per the provider's orders.  Procrit administration without incident; see MAR for injection details.  Patient tolerated procedure well and without incident.  No questions or complaints noted at this time.  Discharged via wheelchair in c/o family.

## 2017-06-28 ENCOUNTER — Ambulatory Visit: Payer: Medicare Other | Admitting: Family Medicine

## 2017-06-28 ENCOUNTER — Ambulatory Visit (INDEPENDENT_AMBULATORY_CARE_PROVIDER_SITE_OTHER): Payer: Medicare Other | Admitting: Family Medicine

## 2017-06-28 ENCOUNTER — Telehealth: Payer: Self-pay | Admitting: *Deleted

## 2017-06-28 ENCOUNTER — Encounter: Payer: Self-pay | Admitting: Family Medicine

## 2017-06-28 VITALS — BP 120/78 | HR 83 | Temp 98.4°F | Resp 16 | Ht 67.0 in | Wt 220.4 lb

## 2017-06-28 DIAGNOSIS — M48061 Spinal stenosis, lumbar region without neurogenic claudication: Secondary | ICD-10-CM | POA: Diagnosis not present

## 2017-06-28 DIAGNOSIS — R110 Nausea: Secondary | ICD-10-CM | POA: Diagnosis not present

## 2017-06-28 DIAGNOSIS — I10 Essential (primary) hypertension: Secondary | ICD-10-CM | POA: Diagnosis not present

## 2017-06-28 DIAGNOSIS — R7303 Prediabetes: Secondary | ICD-10-CM

## 2017-06-28 DIAGNOSIS — Z23 Encounter for immunization: Secondary | ICD-10-CM | POA: Diagnosis not present

## 2017-06-28 DIAGNOSIS — E890 Postprocedural hypothyroidism: Secondary | ICD-10-CM

## 2017-06-28 DIAGNOSIS — R0602 Shortness of breath: Secondary | ICD-10-CM

## 2017-06-28 DIAGNOSIS — I5022 Chronic systolic (congestive) heart failure: Secondary | ICD-10-CM

## 2017-06-28 DIAGNOSIS — I428 Other cardiomyopathies: Secondary | ICD-10-CM

## 2017-06-28 DIAGNOSIS — M7061 Trochanteric bursitis, right hip: Secondary | ICD-10-CM

## 2017-06-28 DIAGNOSIS — E785 Hyperlipidemia, unspecified: Secondary | ICD-10-CM | POA: Diagnosis not present

## 2017-06-28 DIAGNOSIS — I255 Ischemic cardiomyopathy: Secondary | ICD-10-CM

## 2017-06-28 LAB — CUP PACEART REMOTE DEVICE CHECK
Battery Remaining Longevity: 13 mo
Battery Voltage: 2.9 V
Brady Statistic AP VP Percent: 0 %
Brady Statistic AP VS Percent: 0 %
Brady Statistic AS VP Percent: 99 %
Brady Statistic AS VS Percent: 1 %
Brady Statistic RA Percent Paced: 0 %
Brady Statistic RV Percent Paced: 99.2 %
Date Time Interrogation Session: 20181002052405
HighPow Impedance: 38 Ohm
HighPow Impedance: 45 Ohm
Implantable Lead Implant Date: 20040521
Implantable Lead Implant Date: 20071218
Implantable Lead Implant Date: 20160516
Implantable Lead Location: 753858
Implantable Lead Location: 753859
Implantable Lead Location: 753860
Implantable Lead Model: 5076
Implantable Lead Model: 6947
Implantable Pulse Generator Implant Date: 20160516
Lead Channel Impedance Value: 285 Ohm
Lead Channel Impedance Value: 342 Ohm
Lead Channel Impedance Value: 342 Ohm
Lead Channel Impedance Value: 361 Ohm
Lead Channel Impedance Value: 361 Ohm
Lead Channel Impedance Value: 418 Ohm
Lead Channel Impedance Value: 475 Ohm
Lead Channel Impedance Value: 532 Ohm
Lead Channel Impedance Value: 589 Ohm
Lead Channel Impedance Value: 665 Ohm
Lead Channel Impedance Value: 703 Ohm
Lead Channel Impedance Value: 760 Ohm
Lead Channel Impedance Value: 779 Ohm
Lead Channel Pacing Threshold Amplitude: 0.5 V
Lead Channel Pacing Threshold Amplitude: 1.375 V
Lead Channel Pacing Threshold Amplitude: 5 V
Lead Channel Pacing Threshold Pulse Width: 0.4 ms
Lead Channel Pacing Threshold Pulse Width: 0.4 ms
Lead Channel Pacing Threshold Pulse Width: 1.5 ms
Lead Channel Sensing Intrinsic Amplitude: 10.5 mV
Lead Channel Sensing Intrinsic Amplitude: 10.5 mV
Lead Channel Sensing Intrinsic Amplitude: 2.375 mV
Lead Channel Sensing Intrinsic Amplitude: 2.375 mV
Lead Channel Setting Pacing Amplitude: 2 V
Lead Channel Setting Pacing Amplitude: 2.5 V
Lead Channel Setting Pacing Amplitude: 2.5 V
Lead Channel Setting Pacing Pulse Width: 0.6 ms
Lead Channel Setting Pacing Pulse Width: 1 ms
Lead Channel Setting Sensing Sensitivity: 0.3 mV

## 2017-06-28 NOTE — Patient Outreach (Signed)
[place] [facility]) Care Management  06/28/2017  [name] [name] [birth year] [id number]   Care coordination  [name] received a call from Dr [name] about assisting with home health services and pcp follow up appointment Dr [name] wants to see [name] [name] on 06/28/17 am in her office and will have her appointment staff to call [name] [name] Dr [name] wants [name] [name] to have HHPT twice a week for 2-4 weeks a verbal order was given and she requested [facility] be contacted [name] called to update [name] [name]. She reports she continues to have medications since leaving the hospital and is feeling better.  She voiced her appreciation of [name] services  [name] [name] called [facility] who states a face to face and written order needs to be faxed to [phone] [facility] [name] spoke with Dr [name] staff to request a face to face and written order needs to be faxed to [phone]  Plans [name] [name] will be seen in 2-3 weeks for follow up home visit   [name] [name]. Lavina [name], RN, BSN, [place] Care Management [phone]

## 2017-06-28 NOTE — Progress Notes (Signed)
[name] [name]     MRN: [id number]      DOB: May 26, 1932   HPI [name] [name] is here for follow up and re-evaluation of chronic medical conditions, medication management and review of any available recent lab and radiology data.  Also recently evaluated at heme/ oncology clinic, assesses as being hypotensive and here for f/u of this, she was asymptomatic at the time and remains this way She has severe spinal stenosis and chronic back and right lower extremity pain and weakness which is managed by orthopedics. She is experiencing increased and uncontrolled pain and weakness in low back and RLE with increased instability and fall risk , needs and will benefit from physical therapy. She has had this in the past and has benefited, she is home bound , has no means of transporting herself independently , hence in home physical therapy twice weekly for  6 weeks is recommended to improve her safety inside and outside of her home as well as her quality of life. Preventive health is updated, specifically Immunization.   She apparently has not been seen by her primary cardiologist for this calendar year, has been doing fairly well, but annual evaluation needed The PT denies any adverse reactions to current medications since the last visit.  Requests in home physical therapy for chronic back and LE pain and weakness, increased fall risk will benefit greatly esp on right side, referral for twice weekly sessions for 6 weeks is requested based on this face to face visit  ROS Denies recent fever or chills. Denies sinus pressure, nasal congestion, ear pain or sore throat. Denies chest congestion, productive cough or wheezing. Denies chest pains, palpitations and leg swelling Denies abdominal pain, has chronic  Nausea,denies vomiting,diarrhea or constipation.   Denies dysuria, frequency, hesitancy or incontinence. C/o increased and uncontrolled joint pain,  and limitation in mobility. Denies headaches,  seizures,c/o right lower extremity numbness, and  tingling. Denies depression, anxiety or insomnia. Denies skin break down or rash.   PE  BP 120/78   Pulse 83   Temp 98.4 F (36.9 C) (Other (Comment))   Resp 16   Ht 5\' 7"  (1.702 m)   Wt 220 lb 6.4 oz (100 kg)   SpO2 97%   BMI 34.52 kg/m   Patient alert and oriented and in no cardiopulmonary distress.  HEENT: No facial asymmetry, EOMI,   oropharynx pink and moist.  Neck supple no JVD, no mass.  Chest: Clear to auscultation bilaterally.  CVS: S1, S2 holosystolic  Murmur, click,, no S3.Regular rate.  ABD: Soft non tender.   Ext: No edema  MS: Markedly reduced  ROM spine,  hips and knees.  Skin: Intact, no ulcerations or rash noted.  Psych: Good eye contact, normal affect. Memory intact not anxious or depressed appearing.  CNS: CN 2-12 intact, power,  normal throughout.no focal deficits noted.   Assessment & Plan  Non-ischemic cardiomyopathy with ICD Clinically stable with no decompensation, has not seen regular cardiologist since 2017, will refer and leave at discretion of that office to schedule appt  Spinal stenosis of lumbar region Increased and uncontrolled pain with RLE weakness , numbness and instability, increasing fall risk. 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   Lipid Panel  Lab Results  Component Value Date   CHOL 193 06/28/2017   HDL 38 (L) 06/28/2017   LDLCALC 96 02/21/2017   TRIG 254 (H) 06/28/2017   CHOLHDL 5.1 (H) 06/28/2017   Uncontrolled, TG elevated, needs to reduce fried an fatty foods Will rept in 4.5 months    Prediabetes Patient educated about the importance of limiting  Carbohydrate intake , the need to commit to daily physical activity for a minimum of 30 minutes , and to commit weight loss. The fact that changes in all these areas will reduce or eliminate all together the development of diabetes is stressed.   Diabetic Labs Latest Ref Rng & Units 06/28/2017 06/20/2017 04/25/2017 02/21/2017 02/21/2017  HbA1c <5.7 % of total Hgb 5.9(H) - - 5.7(H) -  Microalbumin mg/dL 1.5 - - - -  Micro/Creat Ratio <30 mcg/mg creat 75(H) - - - -  Chol <200 mg/dL 193 - - 178 -  HDL >50 mg/dL 38(L) - - 30(L) -  Calc LDL 0 - 99 mg/dL - - - 96 -  Triglycerides <150 mg/dL 254(H) - - 261(H) -  Creatinine 0.44 - 1.00 mg/dL - 1.69(H) 1.70(H) 1.62(H) 1.65(H)   BP/Weight 06/28/2017 06/27/2017 06/20/2017 05/16/2017 05/08/2017 04/26/2017 7/49/4496  Systolic BP 759 93 [phone] 701  Diastolic BP 78 51 78 55 70 78 70  Wt. (Lbs) 220.4 - 225 220 225 - 225  BMI 34.52 - 35.24 34.46 35.24 - 35.24   Foot/eye exam completion dates Latest Ref Rng & Units 07/03/2016 07/05/2015  Eye Exam No Retinopathy Retinopathy(A) No Retinopathy  Foot Form Completion - - -   Slight deterioration, encouraged to reduce carbs and sweets

## 2017-06-28 NOTE — Patient Instructions (Addendum)
Please cancel 10/17 appointment  F/u in 4 .5 months, call if you need me sooner  Flu vaccine today  No change in medication    Microalb from office today  Labs today, lipid,hBA1c, [place]  Physical therapy at home twice weekly for 6 weeks needed  For strengthening  You are referred to cardiology, that  office will schedule the appt   Be careful not to fall  Thank you  for choosing [facility]. We consider it a privelige to serve you.  Delivering excellent health care in a caring and  compassionate way is our goal.  Partnering with you,  so that together we can achieve this goal is our strategy.

## 2017-06-28 NOTE — Assessment & Plan Note (Signed)
Clinically stable with no decompensation, has not seen regular cardiologist since 2017, will refer and leave at discretion of that office to schedule appt

## 2017-06-29 DIAGNOSIS — Z23 Encounter for immunization: Secondary | ICD-10-CM | POA: Diagnosis not present

## 2017-06-29 LAB — LIPID PANEL
Cholesterol: 193 mg/dL (ref <200)
HDL: 38 mg/dL — ABNORMAL LOW (ref >50)
LDL Cholesterol (Calc): 119 mg/dL (calc) — ABNORMAL HIGH
Non-HDL Cholesterol (Calc): 155 mg/dL (calc) — ABNORMAL HIGH (ref <130)
Total CHOL/HDL Ratio: 5.1 (calc) — ABNORMAL HIGH (ref <5.0)
Triglycerides: 254 mg/dL — ABNORMAL HIGH (ref <150)

## 2017-06-29 LAB — MICROALBUMIN / CREATININE URINE RATIO
Creatinine, Urine: 20 mg/dL (ref 20–275)
Microalb Creat Ratio: 75 mcg/mg creat — ABNORMAL HIGH (ref <30)
Microalb, Ur: 1.5 mg/dL

## 2017-06-29 LAB — HEMOGLOBIN A1C
Hgb A1c MFr Bld: 5.9 % of total Hgb — ABNORMAL HIGH (ref <5.7)
Mean Plasma Glucose: 123 (calc)
eAG (mmol/L): 6.8 (calc)

## 2017-06-29 LAB — TSH: TSH: 1 mIU/L (ref 0.40–4.50)

## 2017-06-29 NOTE — Assessment & Plan Note (Signed)
Hyperlipidemia:Low fat diet discussed and encouraged.   Lipid Panel  Lab Results  Component Value Date   CHOL 193 06/28/2017   HDL 38 (L) 06/28/2017   LDLCALC 96 02/21/2017   TRIG 254 (H) 06/28/2017   CHOLHDL 5.1 (H) 06/28/2017   Uncontrolled, TG elevated, needs to reduce fried an fatty foods Will rept in 4.5 months

## 2017-06-29 NOTE — Assessment & Plan Note (Signed)
Controlled, no change in medication  

## 2017-06-29 NOTE — Assessment & Plan Note (Signed)
Denies overt symptoms of heart failure and exam is within normal, needs annual cardiology evaluation will refer

## 2017-06-29 NOTE — Assessment & Plan Note (Signed)
Increased and uncontrolled pain with RLE weakness , numbness and instability, increasing fall risk. Requires physical therapy for improved mobility, reduced fall risk and strengthen, referred for in home PT twice weekly for 4 to 6 weeks Patient is home bound, has no independent means of safe transport to physical therapy department

## 2017-06-29 NOTE — Assessment & Plan Note (Signed)
Chronic due to cholelithiasis, daily zofran dependence

## 2017-06-29 NOTE — Assessment & Plan Note (Signed)
Managed by orthopedics and received intra articular injections intermittently

## 2017-06-29 NOTE — Assessment & Plan Note (Addendum)
Patient educated about the importance of limiting  Carbohydrate intake , the need to commit to daily physical activity for a minimum of 30 minutes , and to commit weight loss. The fact that changes in all these areas will reduce or eliminate all together the development of diabetes is stressed.   Diabetic Labs Latest Ref Rng & Units 06/28/2017 06/20/2017 04/25/2017 02/21/2017 02/21/2017  HbA1c <5.7 % of total Hgb 5.9(H) - - 5.7(H) -  Microalbumin mg/dL 1.5 - - - -  Micro/Creat Ratio <30 mcg/mg creat 75(H) - - - -  Chol <200 mg/dL 193 - - 178 -  HDL >50 mg/dL 38(L) - - 30(L) -  Calc LDL 0 - 99 mg/dL - - - 96 -  Triglycerides <150 mg/dL 254(H) - - 261(H) -  Creatinine 0.44 - 1.00 mg/dL - 1.69(H) 1.70(H) 1.62(H) 1.65(H)   BP/Weight 06/28/2017 06/27/2017 06/20/2017 05/16/2017 05/08/2017 04/26/2017 9/45/8592  Systolic BP 924 93 [phone] 657  Diastolic BP 78 51 78 55 70 78 70  Wt. (Lbs) 220.4 - 225 220 225 - 225  BMI 34.52 - 35.24 34.46 35.24 - 35.24   Foot/eye exam completion dates Latest Ref Rng & Units 07/03/2016 07/05/2015  Eye Exam No Retinopathy Retinopathy(A) No Retinopathy  Foot Form Completion - - -   Slight deterioration, encouraged to reduce carbs and sweets

## 2017-06-29 NOTE — Assessment & Plan Note (Signed)
Oxygen dependent during sleep due to heart failure

## 2017-07-02 NOTE — Telephone Encounter (Signed)
pls order home pT twice weekly for 6 weeks, and pront,  I will sign, the office note is complete, thanks

## 2017-07-02 NOTE — Telephone Encounter (Signed)
msg sent.

## 2017-07-05 ENCOUNTER — Other Ambulatory Visit: Payer: Self-pay | Admitting: *Deleted

## 2017-07-05 ENCOUNTER — Encounter: Payer: Self-pay | Admitting: Orthopaedic Surgery

## 2017-07-05 ENCOUNTER — Telehealth: Payer: Self-pay | Admitting: Family Medicine

## 2017-07-05 ENCOUNTER — Ambulatory Visit (INDEPENDENT_AMBULATORY_CARE_PROVIDER_SITE_OTHER): Payer: Self-pay | Admitting: Orthopaedic Surgery

## 2017-07-05 VITALS — BP 119/69 | HR 87 | Temp 98.0°F | Ht 67.0 in | Wt 222.0 lb

## 2017-07-05 DIAGNOSIS — M25561 Pain in right knee: Secondary | ICD-10-CM

## 2017-07-05 DIAGNOSIS — G8929 Other chronic pain: Secondary | ICD-10-CM

## 2017-07-05 MED ORDER — HYDROCODONE-ACETAMINOPHEN 7.5-325 MG PO TABS: 1.0000 | ORAL_TABLET | Freq: Four times a day (QID) | ORAL | 0 refills | Status: DC | PRN

## 2017-07-05 NOTE — Telephone Encounter (Signed)
Kim from [employer] called to check the status of patients home health order. Cb#: [phone]

## 2017-07-05 NOTE — Progress Notes (Signed)
CC:  I have pain of my right knee. I would like an injection.  The patient has chronic pain of the right knee.  There is no recent trauma.  There is no redness.  Injections in the past have helped.  The knee has no redness, has an effusion and crepitus present.  ROM of the right knee is 0-100.  Impression:  Chronic knee pain right  Return: 2 months  PROCEDURE NOTE:  The patient requests injections of the right knee , verbal consent was obtained.  The right knee was prepped appropriately after time out was performed.   Sterile technique was observed and injection of 1 cc of Depo-Medrol 40 mg with several cc's of plain xylocaine. Anesthesia was provided by ethyl chloride and a 20-gauge needle was used to inject the knee area. The injection was tolerated well.  A band aid dressing was applied.  The patient was advised to apply ice later today and tomorrow to the injection sight as needed.  I have reviewed the [place] web site prior to prescribing narcotic medicine for this patient. Electronically Signed Sanjuana Kava, MD 10/11/201810:29 AM
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
## 2017-07-09 ENCOUNTER — Other Ambulatory Visit: Payer: Self-pay

## 2017-07-09 MED ORDER — TOPROL XL 100 MG PO TB24: 100.0000 mg | ORAL_TABLET | Freq: Every day | ORAL | 1 refills | Status: DC

## 2017-07-09 NOTE — Telephone Encounter (Signed)
Seen 10 4 18

## 2017-07-10 ENCOUNTER — Ambulatory Visit: Payer: Medicare Other | Admitting: Orthopaedic Surgery

## 2017-07-11 ENCOUNTER — Telehealth: Payer: Self-pay | Admitting: Family Medicine

## 2017-07-11 ENCOUNTER — Ambulatory Visit: Payer: Medicare Other | Admitting: Family Medicine

## 2017-07-11 ENCOUNTER — Ambulatory Visit (INDEPENDENT_AMBULATORY_CARE_PROVIDER_SITE_OTHER): Payer: Medicare Other | Admitting: *Deleted

## 2017-07-11 DIAGNOSIS — Z952 Presence of prosthetic heart valve: Secondary | ICD-10-CM

## 2017-07-11 DIAGNOSIS — I481 Persistent atrial fibrillation: Secondary | ICD-10-CM

## 2017-07-11 DIAGNOSIS — Z5181 Encounter for therapeutic drug level monitoring: Secondary | ICD-10-CM

## 2017-07-11 DIAGNOSIS — I4819 Other persistent atrial fibrillation: Secondary | ICD-10-CM

## 2017-07-11 LAB — POCT INR: INR: 3.1

## 2017-07-11 MED ORDER — TOPROL XL 100 MG PO TB24: 100.0000 mg | ORAL_TABLET | Freq: Every day | ORAL | 1 refills | Status: DC

## 2017-07-11 NOTE — Telephone Encounter (Signed)
Requesting  90 day  Toporol xl 100 mg    cb  [phone]

## 2017-07-12 ENCOUNTER — Telehealth: Payer: Self-pay | Admitting: *Deleted

## 2017-07-12 ENCOUNTER — Telehealth: Payer: Self-pay | Admitting: Family Medicine

## 2017-07-12 NOTE — Patient Outreach (Signed)
[facility]) Care Management  07/12/2017  [name] [name] 29-Sep-1931 [id number]   Care coordination   [facility] CM received a call from Dr [name] simpson to ask for assistance with the forms/order for home health therapy for [name] [name] [facility] CM discussed that a face to face could be faxed to her to completed and signed with the therapy orders from [facility]  Dr [name] requested [facility] CM to call [facility] to receive this assistance as she will be out of office after 07/13/17 [facility] CM called and spoke to [place] of [facility] [phone] to confirm what is needed for home health services for [name] [name] She confirms an order from the MD and a face to face encounter (signed) sheet  Minna Merritts assisted with having this faxed to ATTN Dr [name] to fax number [phone] as listed in EPIC   Plans to follow up with [name] [name] related in home therapies in 2- 4 weeks    Kimberly L. Lavina [name], RN, BSN, [place] Care Management [phone]

## 2017-07-12 NOTE — Telephone Encounter (Signed)
I spoke with [name] [name] , she states that [facility] need to fax over a face to face form  For me to sign and gahave faxed back directly to them I cannot use a letter head, she was going to ask them to fax that form over. If you do not see one pls call [facility] early am and ask them to fax one for me please
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
## 2017-07-13 NOTE — Telephone Encounter (Signed)
Patient informed of message below, verbalized understanding.  

## 2017-07-13 NOTE — Telephone Encounter (Signed)
Pls advise tylenol for soreness and aches, if she develops fever, , chills, shortness of breath, or feels worse, needs ED eval

## 2017-07-13 NOTE — Telephone Encounter (Signed)
The paperwork to be faxed to [facility] is completed, pls fax the office vist along with it, thans

## 2017-07-16 NOTE — Telephone Encounter (Signed)
I can not print office notes. You have restricted them and it will not print any information for me other than lab results and patient instructions

## 2017-07-19 ENCOUNTER — Encounter: Payer: Self-pay | Admitting: Licensed Clinical Social Worker

## 2017-07-19 ENCOUNTER — Other Ambulatory Visit: Payer: Self-pay | Admitting: Licensed Clinical Social Worker

## 2017-07-19 NOTE — Patient Outreach (Signed)
Assessment:  CSW spoke via phone with client. CSW verified client identity. CSW and client spoke of client needs.  Client is receiving [facility] nursing support with RN Orpah Melter. Client sees Dr. [name] as primary care doctor. Client said she is attending scheduled client medical appointments. She said she had her prescribed medications and was taking medications as prescribed. CSW informed client that she had met her care plan goals with [facility] CSW services. Thus, CSW informed Demeka on 07/19/17 that CSW would discharge her from [facility] services only on 07/19/17 since she had met her care plan goals with CSW services. Client agreed to this plan. Client will continue to receive [facility] nursing support with RN Joellyn Quails.CSW congratulated client on meeting her care plan goals with [facility] CSW services. Client was appreciative of [facility] CSW support in recent months. CSW thanked client for phone call with CSW on 07/19/17. CSW encouraged Karris to continue to attend her scheduled medical appointments. CSW encouraged Jamilet to contact RN Joellyn Quails as needed to discuss nursing needs of client.    Plan:  CSW is discharging Shylo Zamor from [facility] CSW services only on 07/19/17 since client has met her care plan goals with CSW services.  CSW to inform Alycia Rossetti, Case Management Assistant, that [facility] discharged client from [place] services only on 07/19/17.  CSW to fax letter to Dr. [name] informing Dr. [name] that [facility] discharged client from [facility] services only on 07/19/17.  [name].Armella Stogner MSW, LCSW Licensed Clinical Social Worker [facility] Care Management [phone]

## 2017-07-22 NOTE — Progress Notes (Signed)
Cardiology Office Note   Date:  07/23/2017   ID:  [name] [name], DOB 1931/12/05, MRN [id number]  PCP:  Fayrene Helper, MD  Cardiologist:  Sanda Klein, MD  Chief Complaint  Patient presents with  . Hospitalization Follow-up  . Atrial Fibrillation  . Hypertension      History of Present Illness: [name] [name] is a 83 y.o. female who presents for ongoing assessment and management of atrial fibrillation, s/p DCCV by Dr. Sallyanne Kuster 10/2016, ICD-CRT-D in situ, mitral valve replacement on coumadin, chronic combined systolic and diastolic CHF.  She has been followed by [facility]. She has had her ICD checked remotely on 10/042018. Due to arthritis symptoms she has had recent steroid injections to her right knee per Dr. Luna Glasgow.  She comes today without any cardiac complaints.  She did question whether or not she needed to have her pacemaker replaced at any time in the near future.  She states when it was checked earlier this month she was not told the battery longevity which was left.  The patient denies any problems with bleeding or hemoptysis on Coumadin therapy.  She is being followed in our [place] office Coumadin clinic.  She is medically compliant.  She denies any fluid retention or dyspnea on exertion.  Past Medical History:  Diagnosis Date  . Adenomatous polyp of colon 12/16/2002   Dr. [name]/[facility]  . Allergy   . Cataract   . CHB (complete heart block) ([place]) 10/07/2014      . CHF (congestive heart failure) ([place])       . Chronic kidney disease, stage I 2006  . Constipation       . COPD (chronic obstructive pulmonary disease) ([place])       . DJD (degenerative joint disease) of lumbar spine   . DM (diabetes mellitus) ([place]) 2006  . Esophageal reflux   . Glaucoma   . Gout       . Heart murmur   . Hyperlipemia   . IDA (iron deficiency anemia)    Parenteral iron/[name]  . Insomnia       . Non-ischemic cardiomyopathy ([place])    a.  s/p MDT CRTD  . Obesity, unspecified   . Other and unspecified hyperlipidemia   . Oxygen deficiency 2014   nocturnal;  . Spondylolisthesis   . Spondylosis   . Thyroid cancer ([place])    remote thyroidectomy, no recurrence, pt denies in 2016 thyroid cancer  . Unspecified hypothyroidism       . 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 Vertigo         Past Surgical History:  Procedure Laterality Date  . AORTIC AND MITRAL VALVE REPLACEMENT  2001  . BI-VENTRICULAR IMPLANTABLE CARDIOVERTER DEFIBRILLATOR UPGRADE N/A 01/18/2015   Procedure: BI-VENTRICULAR IMPLANTABLE CARDIOVERTER DEFIBRILLATOR UPGRADE;  Surgeon: Evans Lance, MD;  Location: [facility] CATH LAB;  Service: Cardiovascular;  Laterality: N/A;  . CARDIAC CATHETERIZATION    . CARDIAC VALVE REPLACEMENT    . CARDIOVERSION N/A 11/13/2016   Procedure: CARDIOVERSION;  Surgeon: Sanda Klein, MD;  Location: [facility] ENDOSCOPY;  Service: Cardiovascular;  Laterality: N/A;  . COLONOSCOPY  06/12/2007   Rourk- Normal rectum/Normal colon  . COLONOSCOPY  12/16/02   small hemorrhoids  . COLONOSCOPY  06/20/2012   Procedure: COLONOSCOPY;  Surgeon: Daneil Dolin, MD;  Location: [facility] ENDO SUITE;  Service: Endoscopy;  Laterality: N/A;  10:45  . defibrillator implanted 2006    . DOPPLER ECHOCARDIOGRAPHY  2012  . DOPPLER ECHOCARDIOGRAPHY  05,06,07,08,09,2011  . ESOPHAGOGASTRODUODENOSCOPY  06/12/2007   Rourk- normal esophagus, small hiatal hernia, otherwise normal stomach, D1, D2  . EYE SURGERY Right 2005  . EYE SURGERY Left 2006  . ICD LEAD REMOVAL Left 02/08/2015   Procedure: ICD LEAD REMOVAL;  Surgeon: Evans Lance, MD;  Location: [facility] OR;  Service: Cardiovascular;  Laterality: Left;  "Will plan extraction and insertion of a BiV PM"  **Dr. Roxan Hockey backing up case**  . IMPLANTABLE CARDIOVERTER DEFIBRILLATOR (ICD) GENERATOR CHANGE Left 02/08/2015   Procedure: ICD GENERATOR CHANGE;  Surgeon: Evans Lance, MD;  Location: [place];   Service: Cardiovascular;  Laterality: Left;  . INSERT / REPLACE / REMOVE PACEMAKER    . PACEMAKER INSERTION  May 2016  . pacemaker placed  2004  . right breast cyst removed     benign   . right cataract removed     2005  . TEE WITHOUT CARDIOVERSION N/A 11/13/2016   Procedure: TRANSESOPHAGEAL ECHOCARDIOGRAM (TEE);  Surgeon: Sanda Klein, MD;  Location: [facility] ENDOSCOPY;  Service: Cardiovascular;  Laterality: N/A;  . THYROIDECTOMY       Current Outpatient Prescriptions  Medication Sig Dispense Refill  . acetaminophen (TYLENOL) 500 MG tablet Take 500 mg by mouth every 6 (six) hours as needed (pain).    Marland Kitchen albuterol (PROVENTIL) (2.5 MG/3ML) 0.083% nebulizer solution INHALE 1 VIAL VIA NEBULIZER THREE TIMES DAILY. (Patient taking differently: INHALE 1 VIAL VIA NEBULIZER THREE TIMES DAILY AS NEEDED) 90 vial 5  . albuterol (VENTOLIN HFA) 108 (90 Base) MCG/ACT inhaler Inhale 2 puffs into the lungs every 6 (six) hours as needed for wheezing or shortness of breath. 3 Inhaler 3  . allopurinol (ZYLOPRIM) 100 MG tablet Take 1 tablet (100 mg total) by mouth daily. 90 tablet 1  . azelastine (ASTELIN) 0.1 % nasal spray Place 2 sprays into both nostrils 2 (two) times daily. Use in each nostril as directed (Patient taking differently: Place 2 sprays into both nostrils daily as needed for allergies. Use in each nostril as directed) 30 mL 12  . calcitRIOL (ROCALTROL) 0.25 MCG capsule Take 0.25 mcg by mouth daily.   0  . cholecalciferol (VITAMIN D) 1000 UNITS tablet Take 1,000 Units by mouth daily.    Marland Kitchen COUMADIN 5 MG tablet Take 2 tablets daily except 1 tablet on Tuesdays 180 tablet 1  . digoxin (LANOXIN) 0.125 MG tablet Take 1 tablet (0.125 mg total) by mouth every other day. 45 tablet 3  . docusate sodium (COLACE) 100 MG capsule Take 300 mg by mouth at bedtime.    Marland Kitchen doxycycline (VIBRAMYCIN) 100 MG capsule Take 1 capsule (100 mg total) by mouth 2 (two) times daily. One po bid x 7 days 14 capsule 0  . fluticasone  (FLONASE) 50 MCG/ACT nasal spray USE 2 SPRAYS IN EACH       NOSTRIL ONCE DAILY (Patient taking differently: USE 2 SPRAYS IN EACH       NOSTRIL ONCE DAILY AS NEEDED FOR CONGESTION) 48 g 1  . folic acid (FOLVITE) 1 MG tablet Take 1 tablet (1 mg total) by mouth daily. 100 tablet 3  . furosemide (LASIX) 40 MG tablet Take 1 tablet by mouth  daily 90 tablet 1  . gabapentin (NEURONTIN) 300 MG capsule Take 1 capsule (300 mg total) by mouth at bedtime. 30 capsule 3  . hydrALAZINE (APRESOLINE) 25 MG tablet take 1 tablet by mouth three times a day 270 tablet 3  . HYDROcodone-acetaminophen (NORCO) 7.5-325  MG tablet Take 1 tablet by mouth every 6 (six) hours as needed for moderate pain (Must last 30 days.Do not drive or operate machinery while taking this medicine.). 70 tablet 0  . isosorbide mononitrate (IMDUR) 60 MG 24 hr tablet Take 1 tablet (60 mg total) by mouth daily. 90 tablet 1  . losartan (COZAAR) 50 MG tablet take 1 tablet by mouth once daily 90 tablet 1  . meclizine (ANTIVERT) 25 MG tablet Take 1 tablet (25 mg total) by mouth 3 (three) times daily as needed for dizziness. 30 tablet 0  . metolazone (ZAROXOLYN) 2.5 MG tablet TAKE 1 TABLET ONCE A WEEK 12 tablet 2  . ondansetron (ZOFRAN) 4 MG tablet Take 1 tablet (4 mg total) by mouth daily as needed for nausea or vomiting. 90 tablet 0  . Polyethyl [name] (SYSTANE OP) Place 1 drop into both eyes daily as needed (dry eyes).    . potassium chloride SA (KLOR-CON M20) 20 MEQ tablet Take 1 tablet by mouth  daily 90 tablet 1  . pravastatin (PRAVACHOL) 20 MG tablet take 1 tablet by mouth once daily 90 tablet 1  . promethazine-dextromethorphan (PROMETHAZINE-DM) 6.25-15 MG/5ML syrup One teaspoon at bedtime, as needed, for excessive cough (Patient taking differently: Take 5 mLs by mouth at bedtime as needed for cough. as needed, for excessive cough) 180 mL 0  . SYNTHROID 75 MCG tablet Take 1 tablet by mouth  daily 90 tablet 1  . TOPROL XL 100 MG 24  hr tablet Take 1 tablet (100 mg total) by mouth daily. Take with or immediately following a meal. 90 tablet 1  . UNABLE TO FIND Walker with seat x 1 1 each 0   No current facility-administered medications for this visit.    Facility-Administered Medications Ordered in Other Visits  Medication Dose Route Frequency Provider Last Rate Last Dose  . epoetin alfa (EPOGEN,PROCRIT) injection 40,000 Units  40,000 Units Subcutaneous Once Farrel Gobble, MD        Allergies:   Penicillins; Aspirin; Fentanyl; and Niacin    Social History:  The patient  reports that she quit smoking about 30 years ago. Her smoking use included Cigarettes. She has a 30.00 pack-year smoking history. She has never used smokeless tobacco. She reports that she does not drink alcohol or use drugs.   Family History:  The patient's family history includes Diabetes in her brother and brother; Heart attack in her sister; Heart disease in her brother, brother, father, and sister; Hypertension in her brother; Lung cancer in her brother; Pancreatic cancer in her mother.    ROS: All other systems are reviewed and negative. Unless otherwise mentioned in H&P    PHYSICAL EXAM: VS:  BP 122/62 (BP Location: Left Arm)   Pulse 81   Ht 5\' 7"  (1.702 m)   Wt 220 lb (99.8 kg)   SpO2 94%   BMI 34.46 kg/m  , BMI Body mass index is 34.46 kg/m. GEN: Well nourished, well developed, in no acute distress  HEENT: normal  Neck: no JVD, carotid bruits, or masses Cardiac: RRR; 1/6 systolic murmurs, rubs, or gallops,no edema  Respiratory:  clear to auscultation bilaterally, normal work of breathing GI: soft, nontender, nondistended, + BS MS: no deformity or atrophy right foot cool but has good pulse. Skin: warm and dry, no rash Neuro:  Strength and sensation are intact Psych: euthymic mood, full affect   Recent Labs: 04/25/2017: ALT 15 06/20/2017: B Natriuretic Peptide 251.0; BUN 39; Creatinine, Ser 1.69; Potassium  4.9; Sodium  135 06/27/2017: Hemoglobin 10.4; Platelets 196 06/28/2017: TSH 1.00    Lipid Panel    Component Value Date/Time   CHOL 193 06/28/2017 1008   CHOL 165 10/16/2016 1213   TRIG 254 (H) 06/28/2017 1008   HDL 38 (L) 06/28/2017 1008   HDL 31 (L) 10/16/2016 1213   CHOLHDL 5.1 (H) 06/28/2017 1008   VLDL 52 (H) 02/21/2017 1010   LDLCALC 96 02/21/2017 1010   LDLCALC 81 10/16/2016 1213      Wt Readings from Last 3 Encounters:  07/23/17 220 lb (99.8 kg)  07/05/17 222 lb (100.7 kg)  07/05/17 222 lb (100.7 kg)      Other studies Reviewed:  Echocardiogram 06/02/2016 Left ventricle: The cavity size was normal. Wall thickness was   increased in a pattern of moderate LVH. Systolic function was   moderately to severely reduced. The estimated ejection fraction   was in the range of 30% to 35%. Diffuse hypokinesis. The study is   not technically sufficient to allow evaluation of LV diastolic   function. - Aortic valve: Normally functioning mechanical aortic valve noted. - Mitral valve: Normally functioning mechanical mitral valve noted. - Left atrium: The atrium was moderately dilated. - Right ventricle: Pacer wire or catheter noted in right ventricle.   Systolic function was mildly reduced. - Right atrium: The atrium was mildly dilated. Pacer wire or   catheter noted in right atrium. - Tricuspid valve: There was mild regurgitation. - Pulmonic valve: There was mild regurgitation. - Pulmonary arteries: PA peak pressure: 36 mm Hg (S). - Pericardium, extracardiac: A trivial pericardial effusion was   identified.    ASSESSMENT AND PLAN:  1.  Chronic combined systolic heart failure: Most recent echocardiogram revealed an EF of 30% to 35%.  The patient has no evidence of decompensation at this time.  Review of her weights have been consistent around the 220 pound range.  She is medically compliant and asymptomatic.  We will not make any changes in her medication regimen at this time.  I would  like to check her BMET to evaluate kidney function on diuretic and ARB.  The patient is due to follow-up with her nephrologist, Dr. Hinda Lenis in the next 2 weeks with planned labs.  I am requesting a copy.  2.  ICD-CRT in situ: [employer].  Recently interrogated on 06/28/2017.  On review of report longevity battery life is at 13 months.  I have informed the patient of this.  She is due to follow with Dr. Lovena Le in our [place] office per protocol.  He may discuss with her need for battery change at that time to be within the one-year mark.  Will defer to Dr. Lovena Le for timing.  3. History of MV replacement on coumadin: Followed in our Coumadin clinic in [place] office.  She is due to be seen in 2 days for INR check.  Continue Coumadin therapy as directed.  She denies any complaints of  dyspnea, or bleeding.  CBC was recently drawn by primary care.  Hemoglobin 10.4, hematocrit 32.4, platelets 196, white blood cells 10.9 (she is receiving steroid injections).  4.  Hypercholesterolemia: Recent labs by PCP revealed LDL of 119, triglycerides 254.  Her total cholesterol was 193, with HDL of 38.  I am going to increase her pravastatin to 40 mg daily from 20 mg daily.  Her LDL is not optimal.  Will need more aggressive cholesterol lowering agent possibly.  For now will increase statin therapy dosing and I  have instructed her on low-cholesterol diet.  Current medicines are reviewed at length with the patient today.    Labs/ tests ordered today include: Requesting labs from nephrology. Phill Myron. [place] Pugh, ANP, AACC   07/23/2017 2:27 PM    [facility] [address], [place], [place] [address] Phone: [phone]; Fax: [phone]

## 2017-07-23 ENCOUNTER — Encounter: Payer: Self-pay | Admitting: Adult Health

## 2017-07-23 ENCOUNTER — Ambulatory Visit (INDEPENDENT_AMBULATORY_CARE_PROVIDER_SITE_OTHER): Payer: Medicare Other | Admitting: Adult Health

## 2017-07-23 VITALS — BP 122/62 | HR 81 | Ht 67.0 in | Wt 220.0 lb

## 2017-07-23 DIAGNOSIS — I255 Ischemic cardiomyopathy: Secondary | ICD-10-CM | POA: Diagnosis not present

## 2017-07-23 DIAGNOSIS — I4891 Unspecified atrial fibrillation: Secondary | ICD-10-CM | POA: Diagnosis not present

## 2017-07-23 DIAGNOSIS — I5032 Chronic diastolic (congestive) heart failure: Secondary | ICD-10-CM

## 2017-07-23 DIAGNOSIS — Z952 Presence of prosthetic heart valve: Secondary | ICD-10-CM

## 2017-07-23 DIAGNOSIS — I10 Essential (primary) hypertension: Secondary | ICD-10-CM | POA: Diagnosis not present

## 2017-07-23 DIAGNOSIS — E7800 Pure hypercholesterolemia, unspecified: Secondary | ICD-10-CM

## 2017-07-23 DIAGNOSIS — I2589 Other forms of chronic ischemic heart disease: Secondary | ICD-10-CM

## 2017-07-23 MED ORDER — HYDRALAZINE HCL 25 MG PO TABS: 25.0000 mg | ORAL_TABLET | Freq: Three times a day (TID) | ORAL | 3 refills | Status: DC

## 2017-07-23 MED ORDER — PRAVASTATIN SODIUM 40 MG PO TABS
40.0000 mg | ORAL_TABLET | Freq: Every evening | ORAL | 3 refills | Status: DC
Start: 2017-07-23 — End: 2018-08-06

## 2017-07-23 MED ORDER — ISOSORBIDE MONONITRATE ER 60 MG PO TB24: 60.0000 mg | ORAL_TABLET | Freq: Every day | ORAL | 1 refills | Status: DC

## 2017-07-23 MED ORDER — POTASSIUM CHLORIDE CRYS ER 20 MEQ PO TBCR: EXTENDED_RELEASE_TABLET | ORAL | 1 refills | Status: DC

## 2017-07-23 MED ORDER — TOPROL XL 100 MG PO TB24: 100.0000 mg | ORAL_TABLET | Freq: Every day | ORAL | 1 refills | Status: DC

## 2017-07-23 MED ORDER — METOLAZONE 2.5 MG PO TABS: ORAL_TABLET | ORAL | 2 refills | Status: DC

## 2017-07-23 MED ORDER — FUROSEMIDE 40 MG PO TABS: ORAL_TABLET | ORAL | 1 refills | Status: DC

## 2017-07-23 MED ORDER — DIGOXIN 125 MCG PO TABS: 0.1250 mg | ORAL_TABLET | ORAL | 3 refills | Status: DC

## 2017-07-23 NOTE — Progress Notes (Signed)
Thank you MCr 

## 2017-07-23 NOTE — Patient Instructions (Signed)
Medication Instructions:  Increase pravastatin to 40 mg daily   Labwork: Please have DR. [facility] fax [place] a copy of labs   Testing/Procedures: none  Follow-Up: Your physician wants you to follow-up in: 6 months .  You will receive a reminder letter in the mail two months in advance. If you don't receive a letter, please call our office to schedule the follow-up appointment.   Any Other Special Instructions Will Be Listed Below (If Applicable).     If you need a refill on your cardiac medications before your next appointment, please call your pharmacy.

## 2017-07-24 ENCOUNTER — Telehealth: Payer: Self-pay | Admitting: *Deleted

## 2017-07-24 NOTE — Telephone Encounter (Signed)
Kaitlin # [phone] called requesting O.V. Notes concerning breathing status in last 6 months Please fax to # [phone]. Also requesting re dated  RX with notes.

## 2017-07-25 ENCOUNTER — Ambulatory Visit (INDEPENDENT_AMBULATORY_CARE_PROVIDER_SITE_OTHER): Payer: Medicare Other | Admitting: *Deleted

## 2017-07-25 DIAGNOSIS — Z5181 Encounter for therapeutic drug level monitoring: Secondary | ICD-10-CM | POA: Diagnosis not present

## 2017-07-25 DIAGNOSIS — Z952 Presence of prosthetic heart valve: Secondary | ICD-10-CM | POA: Diagnosis not present

## 2017-07-25 LAB — POCT INR: INR: 2

## 2017-07-27 ENCOUNTER — Telehealth: Payer: Self-pay | Admitting: Cardiovascular Disease

## 2017-07-27 NOTE — Telephone Encounter (Signed)
Returned call to patient she stated she wanted appointment to see Dr.Croitoru.Stated she saw NP last time, but she wants to see Dr.Croitoru.Stated she is feeling ok.Appointment scheduled with Dr.Croitoru 10/02/17 at 2:00 pm.

## 2017-07-27 NOTE — Telephone Encounter (Signed)
New message    Pt is calling asking for a call back from RN. She said she has a question. She said she is going out for a little bit to call her later.

## 2017-08-01 ENCOUNTER — Telehealth: Payer: Self-pay | Admitting: Family Medicine

## 2017-08-01 NOTE — Telephone Encounter (Signed)
M[place], [facility], left message on nurse line regarding patient. She is calling in regards to a request for office notes sent to our office on November 2. She states they still have not received or heard back from [place]. Callback# [phone]

## 2017-08-01 NOTE — Telephone Encounter (Signed)
Faxed back.

## 2017-08-02 ENCOUNTER — Other Ambulatory Visit: Payer: Self-pay | Admitting: *Deleted

## 2017-08-02 ENCOUNTER — Telehealth: Payer: Self-pay | Admitting: *Deleted

## 2017-08-02 ENCOUNTER — Telehealth: Payer: Self-pay | Admitting: Family Medicine
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
## 2017-08-02 NOTE — Telephone Encounter (Signed)
Angie, [facility], left message on nurse line regarding patient. She is asking for status update on request sent for patient's albuterol. She also asks that if r. [name] has completed previous faxes that they are faxed back to [phone]  Callback# [phone]

## 2017-08-02 NOTE — Telephone Encounter (Signed)
[name] [name] from home health called left message stating [facility] has not received any paper work regarding physical therapy. Per [name] [name] our will will have to call patient

## 2017-08-03 NOTE — Telephone Encounter (Signed)
re-faxed

## 2017-08-03 NOTE — Telephone Encounter (Signed)
I see at the last ov where the referral was requested on 10/4. I referred the patient to [facility] today.

## 2017-08-06 ENCOUNTER — Telehealth: Payer: Self-pay | Admitting: *Deleted

## 2017-08-06 DIAGNOSIS — I428 Other cardiomyopathies: Secondary | ICD-10-CM | POA: Diagnosis not present

## 2017-08-06 DIAGNOSIS — R7303 Prediabetes: Secondary | ICD-10-CM | POA: Diagnosis not present

## 2017-08-06 DIAGNOSIS — R110 Nausea: Secondary | ICD-10-CM | POA: Diagnosis not present

## 2017-08-06 DIAGNOSIS — M7061 Trochanteric bursitis, right hip: Secondary | ICD-10-CM | POA: Diagnosis not present

## 2017-08-06 DIAGNOSIS — E890 Postprocedural hypothyroidism: Secondary | ICD-10-CM | POA: Diagnosis not present

## 2017-08-06 DIAGNOSIS — I110 Hypertensive heart disease with heart failure: Secondary | ICD-10-CM | POA: Diagnosis not present

## 2017-08-06 DIAGNOSIS — Z7901 Long term (current) use of anticoagulants: Secondary | ICD-10-CM | POA: Diagnosis not present

## 2017-08-06 DIAGNOSIS — M48061 Spinal stenosis, lumbar region without neurogenic claudication: Secondary | ICD-10-CM | POA: Diagnosis not present

## 2017-08-06 DIAGNOSIS — E785 Hyperlipidemia, unspecified: Secondary | ICD-10-CM | POA: Diagnosis not present

## 2017-08-06 DIAGNOSIS — K8020 Calculus of gallbladder without cholecystitis without obstruction: Secondary | ICD-10-CM | POA: Diagnosis not present

## 2017-08-06 DIAGNOSIS — I5022 Chronic systolic (congestive) heart failure: Secondary | ICD-10-CM | POA: Diagnosis not present

## 2017-08-06 NOTE — Telephone Encounter (Signed)
Amy Lynch called from [facility] stating patient wants to know if it is safe for the patient to take a dietary supplement with the other medications she is currently taking. Please advise patient

## 2017-08-06 NOTE — Telephone Encounter (Signed)
Advised to consult pharmacist for any interactions

## 2017-08-07 DIAGNOSIS — L851 Acquired keratosis [keratoderma] palmaris et plantaris: Secondary | ICD-10-CM | POA: Diagnosis not present

## 2017-08-07 DIAGNOSIS — L603 Nail dystrophy: Secondary | ICD-10-CM | POA: Diagnosis not present

## 2017-08-07 DIAGNOSIS — E1142 Type 2 diabetes mellitus with diabetic polyneuropathy: Secondary | ICD-10-CM | POA: Diagnosis not present

## 2017-08-08 ENCOUNTER — Encounter (HOSPITAL_COMMUNITY): Payer: Medicare Other | Attending: Oncology

## 2017-08-08 ENCOUNTER — Encounter (HOSPITAL_BASED_OUTPATIENT_CLINIC_OR_DEPARTMENT_OTHER): Payer: Medicare Other

## 2017-08-08 ENCOUNTER — Encounter (HOSPITAL_COMMUNITY): Payer: Self-pay

## 2017-08-08 DIAGNOSIS — N184 Chronic kidney disease, stage 4 (severe): Secondary | ICD-10-CM | POA: Diagnosis not present

## 2017-08-08 DIAGNOSIS — D649 Anemia, unspecified: Secondary | ICD-10-CM

## 2017-08-08 DIAGNOSIS — D631 Anemia in chronic kidney disease: Secondary | ICD-10-CM

## 2017-08-08 DIAGNOSIS — E559 Vitamin D deficiency, unspecified: Secondary | ICD-10-CM | POA: Diagnosis not present

## 2017-08-08 DIAGNOSIS — Z79899 Other long term (current) drug therapy: Secondary | ICD-10-CM | POA: Diagnosis not present

## 2017-08-08 DIAGNOSIS — E611 Iron deficiency: Secondary | ICD-10-CM | POA: Diagnosis not present

## 2017-08-08 DIAGNOSIS — N183 Chronic kidney disease, stage 3 (moderate): Secondary | ICD-10-CM | POA: Diagnosis not present

## 2017-08-08 DIAGNOSIS — D509 Iron deficiency anemia, unspecified: Secondary | ICD-10-CM | POA: Diagnosis not present

## 2017-08-08 DIAGNOSIS — I10 Essential (primary) hypertension: Secondary | ICD-10-CM | POA: Diagnosis not present

## 2017-08-08 DIAGNOSIS — R809 Proteinuria, unspecified: Secondary | ICD-10-CM | POA: Diagnosis not present

## 2017-08-08 LAB — CBC WITH DIFFERENTIAL/PLATELET
Basophils Absolute: 0 10*3/uL (ref 0.0–0.1)
Basophils Relative: 0 %
Eosinophils Absolute: 0 10*3/uL (ref 0.0–0.7)
Eosinophils Relative: 0 %
HCT: 31.9 % — ABNORMAL LOW (ref 36.0–46.0)
Hemoglobin: 10.1 g/dL — ABNORMAL LOW (ref 12.0–15.0)
Lymphocytes Relative: 16 %
Lymphs Abs: 1 10*3/uL (ref 0.7–4.0)
MCH: 30.7 pg (ref 26.0–34.0)
MCHC: 31.7 g/dL (ref 30.0–36.0)
MCV: 97 fL (ref 78.0–100.0)
Monocytes Absolute: 0.4 10*3/uL (ref 0.1–1.0)
Monocytes Relative: 7 %
Neutro Abs: 4.6 10*3/uL (ref 1.7–7.7)
Neutrophils Relative %: 77 %
Platelets: 155 10*3/uL (ref 150–400)
RBC: 3.29 MIL/uL — ABNORMAL LOW (ref 3.87–5.11)
RDW: 15.9 % — ABNORMAL HIGH (ref 11.5–15.5)
WBC: 6 10*3/uL (ref 4.0–10.5)

## 2017-08-08 MED ORDER — EPOETIN ALFA 40000 UNIT/ML IJ SOLN
40000.0000 [IU] | Freq: Once | INTRAMUSCULAR | Status: AC
  Administered 2017-08-08: 40000 [IU] via SUBCUTANEOUS

## 2017-08-08 MED ORDER — EPOETIN ALFA 40000 UNIT/ML IJ SOLN
INTRAMUSCULAR | Status: AC
  Filled 2017-08-08: qty 1

## 2017-08-08 NOTE — Patient Instructions (Signed)
[facility] at [facility] Discharge Instructions  RECOMMENDATIONS MADE BY THE CONSULTANT AND ANY TEST RESULTS WILL BE SENT TO YOUR REFERRING PHYSICIAN.  Procrit given today. Follow up as scheduled.  Thank you for choosing [facility] at [facility] to provide your oncology and hematology care.  To afford each patient quality time with our provider, please arrive at least 15 minutes before your scheduled appointment time.    If you have a lab appointment with the Cancer Center please come in thru the  Main Entrance and check in at the main information desk  You need to re-schedule your appointment should you arrive 10 or more minutes late.  We strive to give you quality time with our providers, and arriving late affects you and other patients whose appointments are after yours.  Also, if you no show three or more times for appointments you may be dismissed from the clinic at the providers discretion.     Again, thank you for choosing [facility].  Our hope is that these requests will decrease the amount of time that you wait before being seen by our physicians.       _____________________________________________________________  Should you have questions after your visit to [facility], please contact our office at [phone] between the hours of 8:30 a.m. and 4:30 p.m.  Voicemails left after 4:30 p.m. will not be returned until the following business day.  For prescription refill requests, have your pharmacy contact our office.       Resources For Cancer Patients and their Caregivers ? American Cancer Society: Can assist with transportation, wigs, general needs, runs Look Good Feel Better.        1-888-227-6333 ? Cancer Care: Provides financial assistance, online support groups, medication/co-pay assistance.  1-800-813-HOPE (4673) ? [facility] Assists [place] cancer patients and  their families through emotional , educational and financial support.  336-427-4357 ? [place] DSS Where to apply for food stamps, Medicaid and utility assistance. 336-342-1394 ? [facility]: Transportation to medical appointments. 336-347-2287 ? Social Security Administration: May apply for disability if have a Stage IV cancer. 336-342-7796 1-800-772-1213 ? [place] Aging, Disability and Transit Services: Assists with nutrition, care and transit needs. 336-349-2343  Cancer Center Support Programs: @[email or web address]@ > Cancer Support Group  2nd Tuesday of the month 1pm-2pm, Journey Room  > Creative Journey  3rd Tuesday of the month 1130am-1pm, Journey Room  > Look Good Feel Better  1st Wednesday of the month 10am-12 noon, Journey Room (Call American Cancer Society to register 1-800-395-5775)   

## 2017-08-08 NOTE — Progress Notes (Signed)
[name] [name] presents today for injection per MD orders. Procrit 40,000 units administered SQ in left Abdomen. Administration without incident. Patient tolerated well.  Treatment given per orders. Patient tolerated it well without problems. Vitals stable and discharged home from clinic via wheelchair. Follow up as scheduled.

## 2017-08-09 ENCOUNTER — Telehealth: Payer: Self-pay | Admitting: Cardiovascular Disease

## 2017-08-09 DIAGNOSIS — M7061 Trochanteric bursitis, right hip: Secondary | ICD-10-CM | POA: Diagnosis not present

## 2017-08-09 DIAGNOSIS — M48061 Spinal stenosis, lumbar region without neurogenic claudication: Secondary | ICD-10-CM | POA: Diagnosis not present

## 2017-08-09 DIAGNOSIS — I110 Hypertensive heart disease with heart failure: Secondary | ICD-10-CM | POA: Diagnosis not present

## 2017-08-09 DIAGNOSIS — I5022 Chronic systolic (congestive) heart failure: Secondary | ICD-10-CM | POA: Diagnosis not present

## 2017-08-09 DIAGNOSIS — K8020 Calculus of gallbladder without cholecystitis without obstruction: Secondary | ICD-10-CM | POA: Diagnosis not present

## 2017-08-09 DIAGNOSIS — I428 Other cardiomyopathies: Secondary | ICD-10-CM | POA: Diagnosis not present

## 2017-08-09 NOTE — Telephone Encounter (Signed)
[name] [name] is calling because she has a arthritis D3 which is over the counter and is wanting to know can she take this medication . Please call

## 2017-08-09 NOTE — Telephone Encounter (Signed)
Spoke with pt, aware of pharm md recommendations. 

## 2017-08-09 NOTE — Telephone Encounter (Signed)
Patient is taking warfarin, will forward to [facility] to review and advise.

## 2017-08-09 NOTE — Telephone Encounter (Signed)
Okay to take arthri-D3 with current therapy.  Please update coumadin clinic during next f/u if taking daily or not

## 2017-08-12 DIAGNOSIS — M48061 Spinal stenosis, lumbar region without neurogenic claudication: Secondary | ICD-10-CM | POA: Diagnosis not present

## 2017-08-12 DIAGNOSIS — I5022 Chronic systolic (congestive) heart failure: Secondary | ICD-10-CM | POA: Diagnosis not present

## 2017-08-12 DIAGNOSIS — M7061 Trochanteric bursitis, right hip: Secondary | ICD-10-CM | POA: Diagnosis not present

## 2017-08-12 DIAGNOSIS — K8020 Calculus of gallbladder without cholecystitis without obstruction: Secondary | ICD-10-CM | POA: Diagnosis not present

## 2017-08-12 DIAGNOSIS — I110 Hypertensive heart disease with heart failure: Secondary | ICD-10-CM | POA: Diagnosis not present

## 2017-08-12 DIAGNOSIS — I428 Other cardiomyopathies: Secondary | ICD-10-CM | POA: Diagnosis not present

## 2017-08-14 DIAGNOSIS — M48061 Spinal stenosis, lumbar region without neurogenic claudication: Secondary | ICD-10-CM | POA: Diagnosis not present

## 2017-08-14 DIAGNOSIS — M7061 Trochanteric bursitis, right hip: Secondary | ICD-10-CM | POA: Diagnosis not present

## 2017-08-14 DIAGNOSIS — I5022 Chronic systolic (congestive) heart failure: Secondary | ICD-10-CM | POA: Diagnosis not present

## 2017-08-14 DIAGNOSIS — K8020 Calculus of gallbladder without cholecystitis without obstruction: Secondary | ICD-10-CM | POA: Diagnosis not present

## 2017-08-14 DIAGNOSIS — I110 Hypertensive heart disease with heart failure: Secondary | ICD-10-CM | POA: Diagnosis not present

## 2017-08-14 DIAGNOSIS — I428 Other cardiomyopathies: Secondary | ICD-10-CM | POA: Diagnosis not present

## 2017-08-15 DIAGNOSIS — D638 Anemia in other chronic diseases classified elsewhere: Secondary | ICD-10-CM | POA: Diagnosis not present

## 2017-08-15 DIAGNOSIS — R809 Proteinuria, unspecified: Secondary | ICD-10-CM | POA: Diagnosis not present

## 2017-08-15 DIAGNOSIS — N2581 Secondary hyperparathyroidism of renal origin: Secondary | ICD-10-CM | POA: Diagnosis not present

## 2017-08-15 DIAGNOSIS — N184 Chronic kidney disease, stage 4 (severe): Secondary | ICD-10-CM | POA: Diagnosis not present

## 2017-08-17 IMAGING — DX DG CHEST 2V
2 series · 2 of 2 positions shown · non-contrast
Comparison: 03/20/2016

CLINICAL DATA: Cough and wheezing 1 week.

EXAM:
CHEST  2 VIEW

[chest pa]
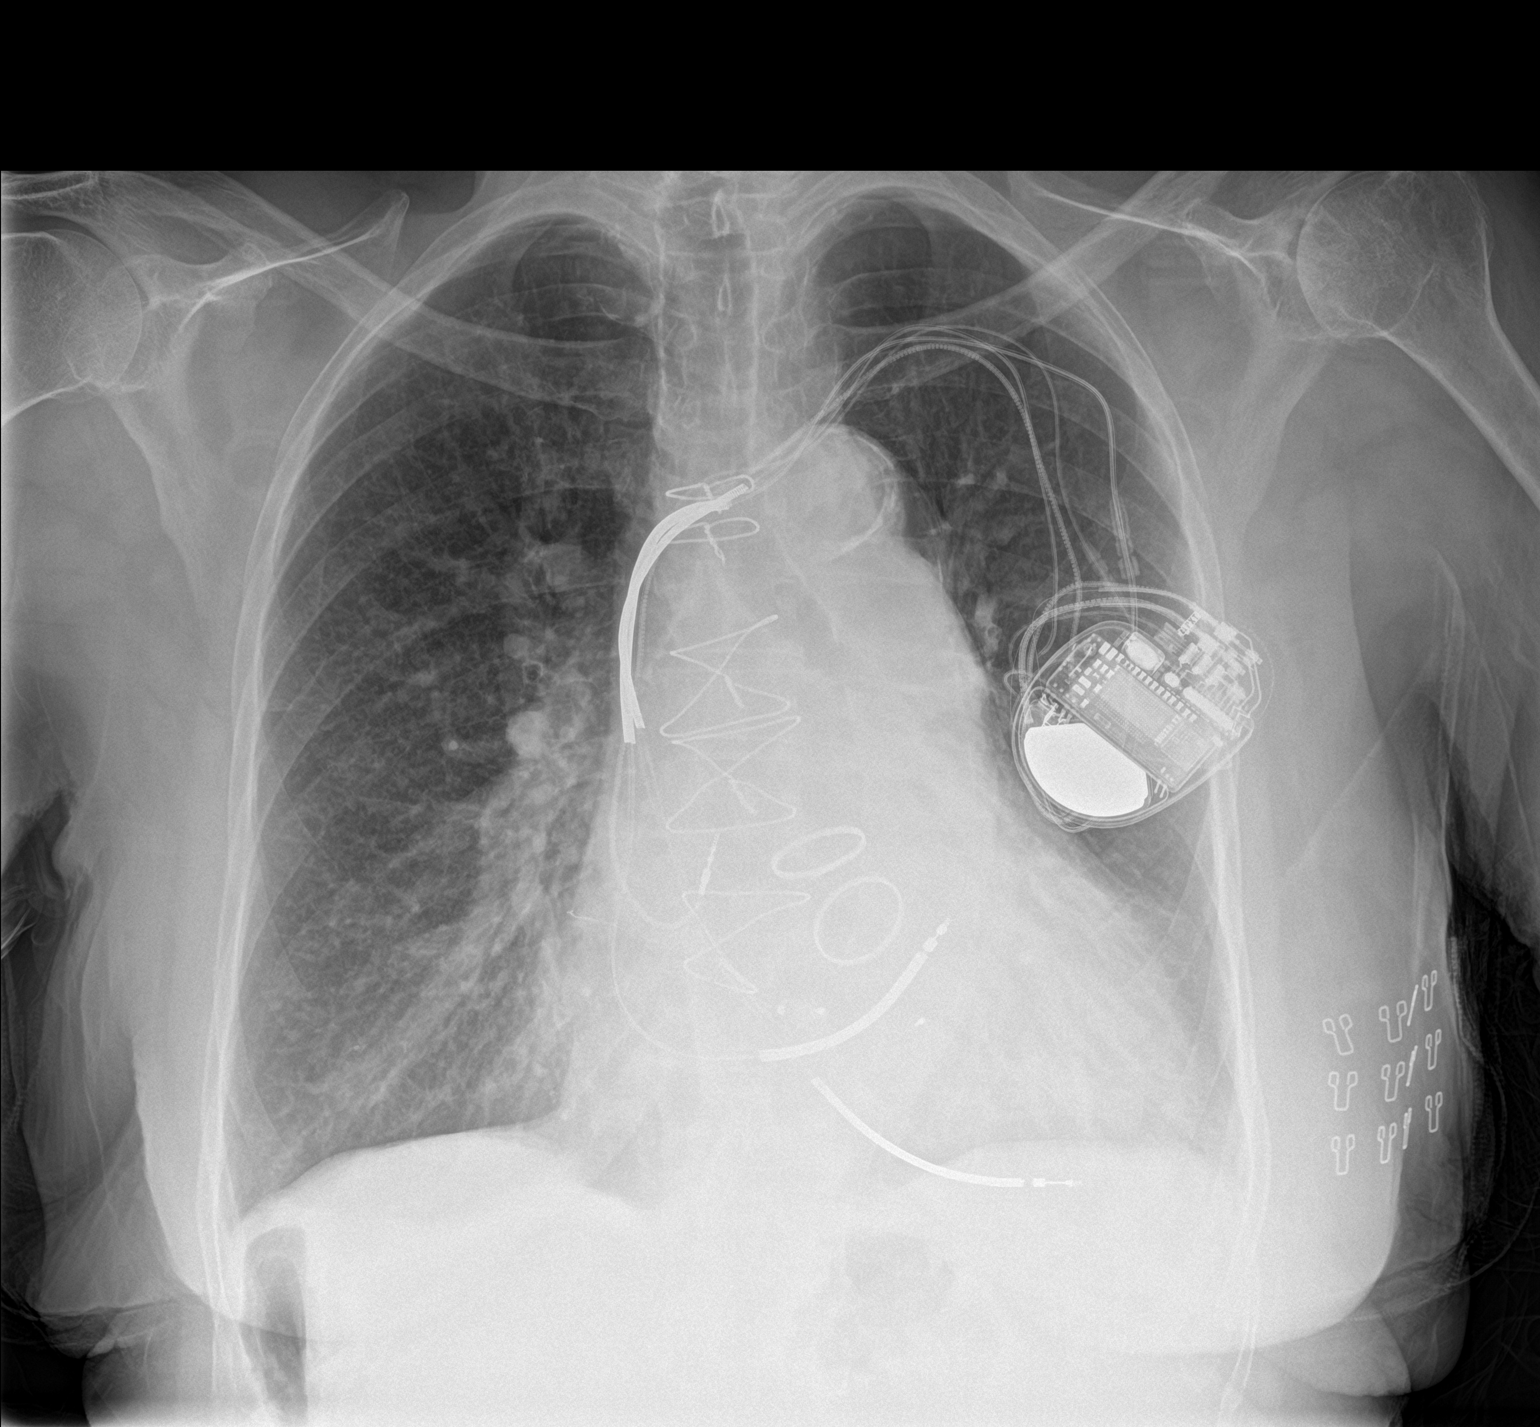

[chest lat]
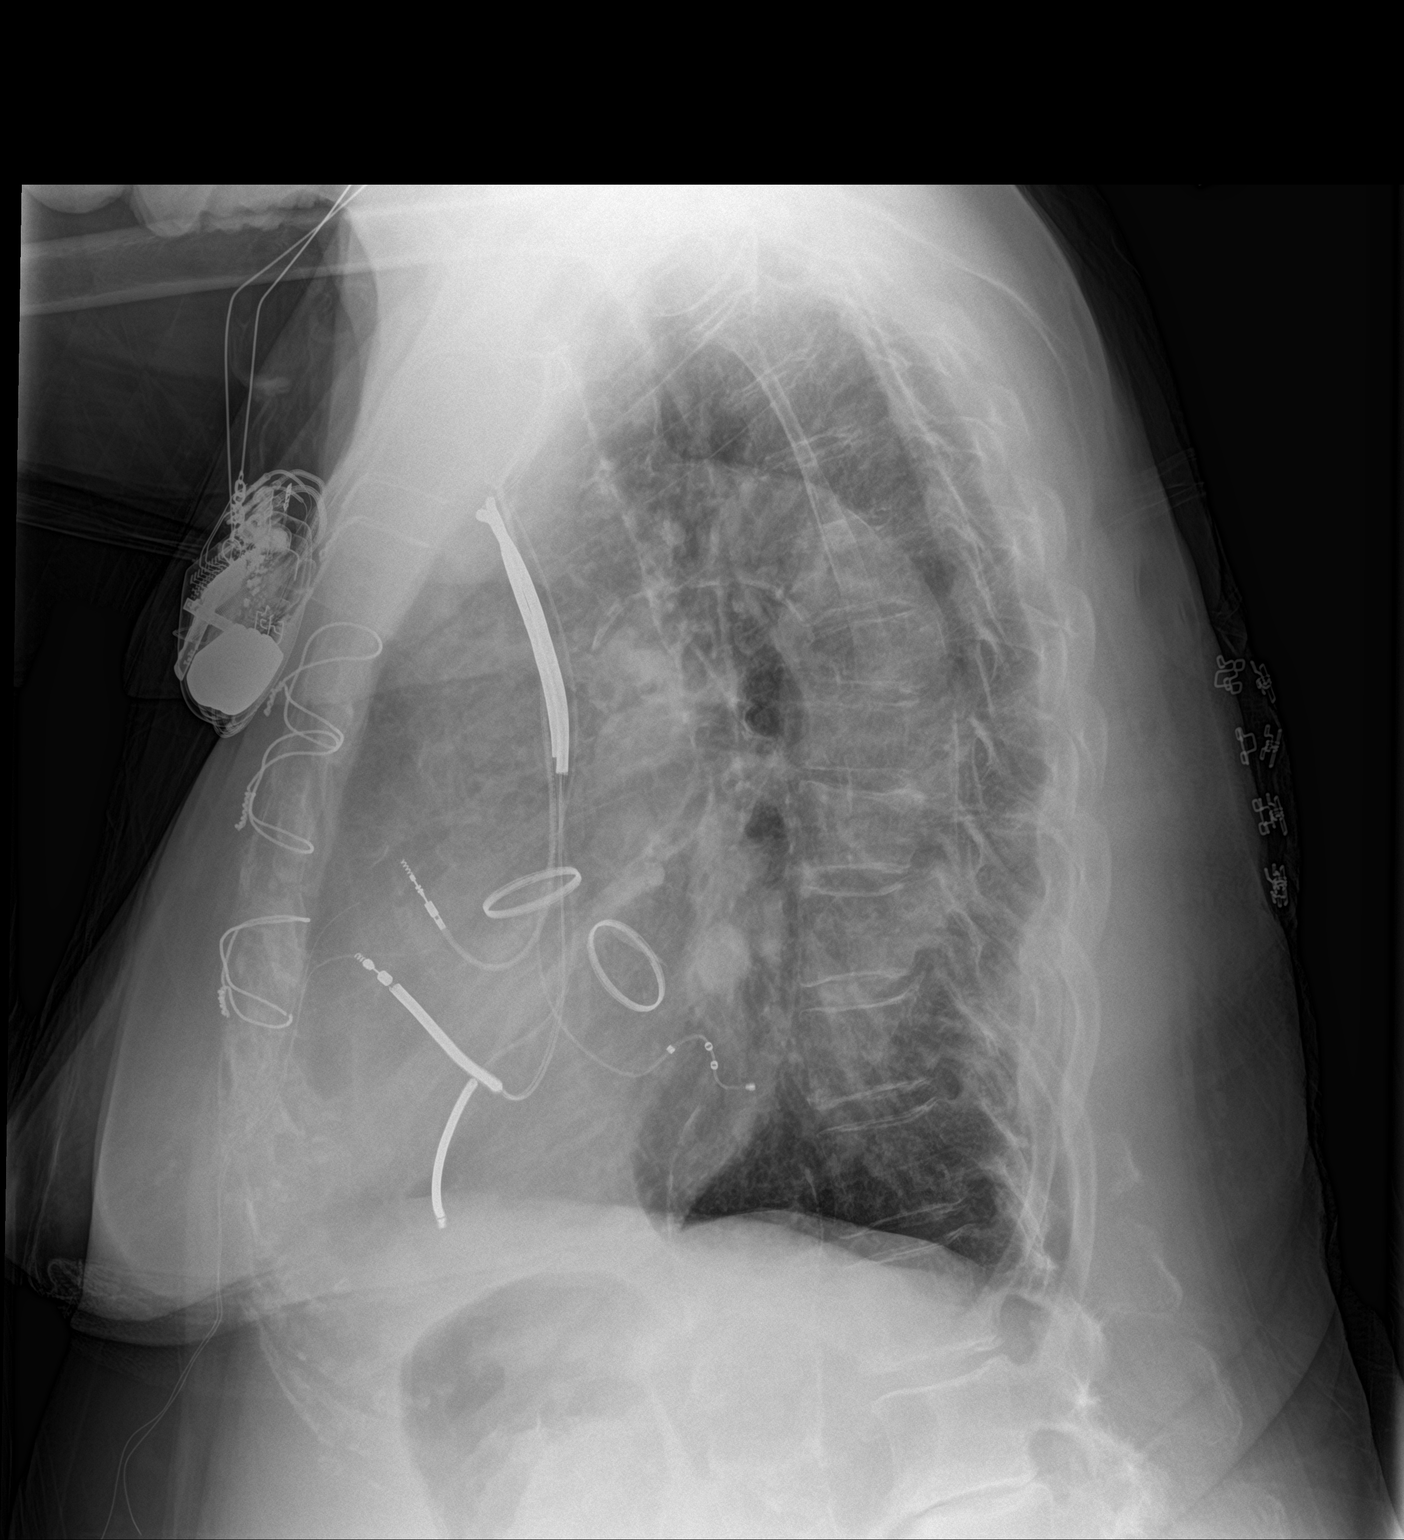

[2 of 2 positions shown; findings below may reference images not displayed]

FINDINGS: Sternotomy wire and left-sided pacemaker unchanged. Lungs are
adequately inflated without focal consolidation or effusion. There
is minimal prominence of the perihilar markings unchanged likely
mild vascular congestion. Mild stable cardiomegaly. Calcified plaque
over the thoracic aorta. Mild stable wedge compression deformity
over the mid thoracic spine.
IMPRESSION: Stable cardiomegaly with suggestion of mild vascular congestion.

Aortic atherosclerosis.

Stable mild wedge compression deformity mid thoracic spine.

## 2017-08-22 ENCOUNTER — Ambulatory Visit (INDEPENDENT_AMBULATORY_CARE_PROVIDER_SITE_OTHER): Payer: Medicare Other | Admitting: *Deleted

## 2017-08-22 DIAGNOSIS — M48061 Spinal stenosis, lumbar region without neurogenic claudication: Secondary | ICD-10-CM | POA: Diagnosis not present

## 2017-08-22 DIAGNOSIS — K8020 Calculus of gallbladder without cholecystitis without obstruction: Secondary | ICD-10-CM | POA: Diagnosis not present

## 2017-08-22 DIAGNOSIS — M7061 Trochanteric bursitis, right hip: Secondary | ICD-10-CM | POA: Diagnosis not present

## 2017-08-22 DIAGNOSIS — Z5181 Encounter for therapeutic drug level monitoring: Secondary | ICD-10-CM | POA: Diagnosis not present

## 2017-08-22 DIAGNOSIS — I110 Hypertensive heart disease with heart failure: Secondary | ICD-10-CM | POA: Diagnosis not present

## 2017-08-22 DIAGNOSIS — Z952 Presence of prosthetic heart valve: Secondary | ICD-10-CM

## 2017-08-22 DIAGNOSIS — I428 Other cardiomyopathies: Secondary | ICD-10-CM | POA: Diagnosis not present

## 2017-08-22 DIAGNOSIS — I5022 Chronic systolic (congestive) heart failure: Secondary | ICD-10-CM | POA: Diagnosis not present

## 2017-08-22 LAB — POCT INR: INR: 3.6

## 2017-08-23 DIAGNOSIS — I110 Hypertensive heart disease with heart failure: Secondary | ICD-10-CM | POA: Diagnosis not present

## 2017-08-23 DIAGNOSIS — M7061 Trochanteric bursitis, right hip: Secondary | ICD-10-CM | POA: Diagnosis not present

## 2017-08-23 DIAGNOSIS — M48061 Spinal stenosis, lumbar region without neurogenic claudication: Secondary | ICD-10-CM | POA: Diagnosis not present

## 2017-08-23 DIAGNOSIS — I428 Other cardiomyopathies: Secondary | ICD-10-CM | POA: Diagnosis not present

## 2017-08-23 DIAGNOSIS — I5022 Chronic systolic (congestive) heart failure: Secondary | ICD-10-CM | POA: Diagnosis not present

## 2017-08-23 DIAGNOSIS — K8020 Calculus of gallbladder without cholecystitis without obstruction: Secondary | ICD-10-CM | POA: Diagnosis not present

## 2017-08-24 DIAGNOSIS — M7061 Trochanteric bursitis, right hip: Secondary | ICD-10-CM | POA: Diagnosis not present

## 2017-08-24 DIAGNOSIS — I5022 Chronic systolic (congestive) heart failure: Secondary | ICD-10-CM | POA: Diagnosis not present

## 2017-08-24 DIAGNOSIS — I110 Hypertensive heart disease with heart failure: Secondary | ICD-10-CM | POA: Diagnosis not present

## 2017-08-24 DIAGNOSIS — M48061 Spinal stenosis, lumbar region without neurogenic claudication: Secondary | ICD-10-CM | POA: Diagnosis not present

## 2017-08-28 DIAGNOSIS — H468 Other optic neuritis: Secondary | ICD-10-CM | POA: Diagnosis not present

## 2017-08-29 ENCOUNTER — Telehealth: Payer: Self-pay | Admitting: Family Medicine

## 2017-08-29 DIAGNOSIS — K8020 Calculus of gallbladder without cholecystitis without obstruction: Secondary | ICD-10-CM | POA: Diagnosis not present

## 2017-08-29 DIAGNOSIS — M7061 Trochanteric bursitis, right hip: Secondary | ICD-10-CM | POA: Diagnosis not present

## 2017-08-29 DIAGNOSIS — I5022 Chronic systolic (congestive) heart failure: Secondary | ICD-10-CM | POA: Diagnosis not present

## 2017-08-29 DIAGNOSIS — M48061 Spinal stenosis, lumbar region without neurogenic claudication: Secondary | ICD-10-CM | POA: Diagnosis not present

## 2017-08-29 DIAGNOSIS — I110 Hypertensive heart disease with heart failure: Secondary | ICD-10-CM | POA: Diagnosis not present

## 2017-08-29 DIAGNOSIS — I428 Other cardiomyopathies: Secondary | ICD-10-CM | POA: Diagnosis not present

## 2017-08-29 NOTE — Telephone Encounter (Signed)
Called and spoke with patient and she said the swelling was better and no SOB anymore. Advised her if it started again to go to the ER. Patient agrees

## 2017-08-29 NOTE — Telephone Encounter (Signed)
[facility] appt left message on machine:   Patient stated during her session she felt like her heart was pounding out of her chest. She had respirations of 40 with fluid build up. She also complained of swelling. She was advised to go to the ER  If needed.  No call back # was left during her message.

## 2017-08-30 ENCOUNTER — Telehealth: Payer: Self-pay | Admitting: *Deleted

## 2017-08-30 ENCOUNTER — Other Ambulatory Visit: Payer: Self-pay | Admitting: *Deleted

## 2017-08-30 NOTE — Patient Outreach (Signed)
[facility]) Care Management   08/30/2017  [name] [name] 1932-06-07 [id number]  [name] [name] is an 83 y.o. 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  [facility] referral received from [facility] in April 2018 to continue to follow for [facility] and home visit for May 2018. She had been hospitalized 2/22-2/23/18 for exacerbation CHF and was previously active with [facility] RN health coach, [name] [name] [name] has not been hospitalized in the last 4 months and is doing well with home care management She is now being followed for [facility] CM complex nursing services   No admissions in the last 6 months (last hospitalization was in February 2018- 8 months ago) and only 1 ED visit on June 20, 2017  for weakness and sob- resolved with breathing treatment, antibiotics, steroids   Subjective:  See notes below  Objective:  BP 126/78   Pulse 69   Temp (!) 97.2 F (36.2 C) (Oral)   Resp 20   Wt 221 lb (100.2 kg)   SpO2 94%   BMI 34.61 kg/m    Review of Systems  HENT: Negative.   Eyes: Negative.   Respiratory: Positive for shortness of breath.   Cardiovascular: Positive for leg swelling. Negative for chest pain, palpitations, orthopnea and claudication.  Gastrointestinal: Positive for constipation.  Genitourinary: Negative.        Reports urine "was hot and smelled fishy but went away when I drank more water" "last week"  Musculoskeletal: Positive for joint pain.  Skin: Negative.   Neurological: Positive for weakness. Negative for tingling, tremors, sensory change, speech change, focal weakness, seizures, loss of consciousness and headaches.       Reports last week episode of dizzy, weak when walking around in her home and 'a  little" sob  Endo/Heme/Allergies: Negative.   Psychiatric/Behavioral: Negative.     Physical Exam  Constitutional: She is oriented to person, place, and time. She appears well-developed and well-nourished.  HENT:  Head: Normocephalic and atraumatic.  Eyes: Conjunctivae are normal. Pupils are equal, round, and reactive to light.  Neck: Normal range of motion. Neck supple.  Cardiovascular: Normal rate and regular rhythm.  Respiratory: Effort normal and breath sounds normal.  GI: Soft. Bowel sounds are normal.  Musculoskeletal: She exhibits edema.  Neurological: She is alert and oriented to person, place, and time.  Skin: Skin is warm.  Psychiatric: She has a normal mood and affect. Her behavior is normal. Judgment and thought content normal.    Encounter Medications:   Outpatient Encounter Medications as of 08/30/2017  Medication Sig Note  . acetaminophen (TYLENOL) 500 MG tablet Take 500 mg by mouth every 6 (six) hours as needed (pain).   Marland Kitchen albuterol (PROVENTIL) (2.5 MG/3ML) 0.083% nebulizer solution INHALE 1 VIAL VIA NEBULIZER THREE TIMES DAILY. (Patient taking differently: INHALE 1 VIAL VIA NEBULIZER THREE TIMES DAILY AS NEEDED)   . albuterol (VENTOLIN HFA) 108 (90 Base) MCG/ACT inhaler Inhale 2 puffs into the lungs every 6 (six) hours as needed for wheezing or shortness of breath.   . allopurinol (ZYLOPRIM) 100 MG tablet Take 1 tablet (100 mg total) by mouth daily.   Marland Kitchen azelastine (ASTELIN) 0.1 % nasal spray Place 2 sprays into both nostrils 2 (two) times daily. Use in each nostril as directed (Patient taking  differently: Place 2 sprays into both nostrils daily as needed for allergies. Use in each nostril as directed)   . calcitRIOL (ROCALTROL) 0.25 MCG capsule Take 0.25 mcg by mouth daily.  08/02/2017: Last filled 07/19/17 with instructions to take mondays, wednesdays and fridays 1 tab  . cholecalciferol (VITAMIN D) 1000 UNITS tablet Take 1,000 Units by mouth daily.   Marland Kitchen COUMADIN 5 MG  tablet Take 2 tablets daily except 1 tablet on Tuesdays   . digoxin (LANOXIN) 0.125 MG tablet Take 1 tablet (0.125 mg total) by mouth every other day.   . docusate sodium (COLACE) 100 MG capsule Take 300 mg by mouth at bedtime.   . fluticasone (FLONASE) 50 MCG/ACT nasal spray USE 2 SPRAYS IN EACH       NOSTRIL ONCE DAILY (Patient taking differently: USE 2 SPRAYS IN EACH       NOSTRIL ONCE DAILY AS NEEDED FOR CONGESTION)   . folic acid (FOLVITE) 1 MG tablet Take 1 tablet (1 mg total) by mouth daily.   . furosemide (LASIX) 40 MG tablet Take 1 tablet by mouth  daily   . gabapentin (NEURONTIN) 300 MG capsule Take 1 capsule (300 mg total) by mouth at bedtime.   . hydrALAZINE (APRESOLINE) 25 MG tablet Take 1 tablet (25 mg total) by mouth 3 (three) times daily.   Marland Kitchen HYDROcodone-acetaminophen (NORCO) 7.5-325 MG tablet Take 1 tablet by mouth every 6 (six) hours as needed for moderate pain (Must last 30 days.Do not drive or operate machinery while taking this medicine.).   Marland Kitchen isosorbide mononitrate (IMDUR) 60 MG 24 hr tablet Take 1 tablet (60 mg total) by mouth daily.   Marland Kitchen losartan (COZAAR) 50 MG tablet take 1 tablet by mouth once daily   . meclizine (ANTIVERT) 25 MG tablet Take 1 tablet (25 mg total) by mouth 3 (three) times daily as needed for dizziness.   . metolazone (ZAROXOLYN) 2.5 MG tablet TAKE 1 TABLET ONCE A WEEK   . ondansetron (ZOFRAN) 4 MG tablet Take 1 tablet (4 mg total) by mouth daily as needed for nausea or vomiting.   [name] [name] (SYSTANE OP) Place 1 drop into both eyes daily as needed (dry eyes).   . potassium chloride SA (KLOR-CON M20) 20 MEQ tablet Take 1 tablet by mouth  daily   . pravastatin (PRAVACHOL) 40 MG tablet Take 1 tablet (40 mg total) by mouth every evening.   . promethazine-dextromethorphan (PROMETHAZINE-DM) 6.25-15 MG/5ML syrup One teaspoon at bedtime, as needed, for excessive cough (Patient taking differently: Take 5 mLs by mouth at bedtime as needed for  cough. as needed, for excessive cough)   . SYNTHROID 75 MCG tablet Take 1 tablet by mouth  daily   . TOPROL XL 100 MG 24 hr tablet Take 1 tablet (100 mg total) by mouth daily. Take with or immediately following a meal.   . UNABLE TO FIND Walker with seat x 1   . [DISCONTINUED] doxycycline (VIBRAMYCIN) 100 MG capsule Take 1 capsule (100 mg total) by mouth 2 (two) times daily. One po bid x 7 days (Patient not taking: Reported on 08/02/2017)    Facility-Administered Encounter Medications as of 08/30/2017  Medication  . epoetin alfa (EPOGEN,PROCRIT) injection 40,000 Units    Functional Status:   In your present state of health, do you have any difficulty performing the following activities: 04/26/2017 03/15/2017  Hearing? N N  Vision? N N  Difficulty concentrating or making decisions? N N  Walking or climbing stairs? N  N  Comment - -  Dressing or bathing? N N  Doing errands, shopping? N [place] and eating ? N N  Using the Toilet? N N  In the past six months, have you accidently leaked urine? N N  Do you have problems with loss of bowel control? N N  Managing your Medications? N N  Managing your Finances? N N  Housekeeping or managing your Housekeeping? N N  Some recent data might be hidden    Fall/Depression Screening:    Fall Risk  06/28/2017 04/26/2017 03/15/2017  Falls in the past year? No No No  Number falls in past yr: - - -  Injury with Fall? - - -  Risk for fall due to : - Impaired balance/gait;Impaired mobility;Medication side effect Impaired mobility;Medication side effect;Impaired balance/gait  Follow up - - -   PHQ 2/9 Scores 06/28/2017 04/26/2017 03/20/2017 03/15/2017 03/12/2017 11/30/2016 11/03/2016  PHQ - 2 Score 0 0 0 0 0 1 0  PHQ- 9 Score - - - - - - -    Assessment:   [name] [name] met [facility] Cm at her front door using her cane only and escorted Cm to her living room  Now has home health visiting Seen by [facility] on 08/29/17 who called pcp office with sob and  swelling The pcp office called back and recommended going to the ED but [name] [name] did not go.  [name] [name] admits to constipation Reports a large stool on 08/29/17 and states this resolved her issues Reports she took extra diuretics on 08/29/17 Today minimal edema noted around the ankles. Still taking colace 300 mg qhs   went to eye dr this week and there is no changes in the plan of care Left eye lid not swollen today   Respiratory Noted to increase breathing but she denies sob.  Reports "feeling tired when I sit around her during the day" Noted to nod x 2 during the home visit She self administered a breathing treatment during the home visit  GU Encouraged hydration  Upcoming appointments February 2019 pcp [name]) last seen for f/u Ed visit in October 2018  Reminded her of 09/10/17 1315 coumadin appointment  Uses her own calendar   Her only concern she is wanting to address in 2019 is right hip and leg pain  Plan:  Sent an in basket message to Dr Luna Glasgow - Dr Luna Glasgow visiting with [name] [name] who has shared that her pain level today for her right hip and leg is an 8 today She reports being ordered hydrocodone 7.5 mg 1 tab q 6 hrs prn  but takes 1 1/2 tabs because the 1 tab is not effect to relieve her pain and she has run out of hydrocodone today Please advise! Shared Do not call contact number [phone] when she received scam call during the home visit Assisted her in registering her home number today  Checked vitals O2 sat low 90s recommends taking her breathing treatment she reports she has not taken today (generally takes one breathing treatment)     Call attempt to Dr Luna Glasgow office but voice message states office closed on 08/30/17 and re opens on 08/31/17 - Encouraged her to call the office on 08/31/17 Pending possible response from in basket   Discuss her meeting her 2018 goals and possible next contact as telephone assessment She agreed    Joelene Millin L. Lavina [name], RN, BSN, [place]  Care Management [phone]

## 2017-08-30 NOTE — Telephone Encounter (Signed)
Verbal order left on voicemail.  

## 2017-08-30 NOTE — Telephone Encounter (Signed)
Amy Lynch called from physical therapy at [facility] regarding an extension on patient's therapy because patient was not feeling well this past week and they want to end on a good note. Amy would like a 3 week extension for 3 weeks, twice a week for 2 weeks and 1 time a week for one week. Please call with verbal order to [phone].

## 2017-08-31 ENCOUNTER — Emergency Department (HOSPITAL_COMMUNITY): Payer: Medicare Other

## 2017-08-31 ENCOUNTER — Other Ambulatory Visit: Payer: Self-pay

## 2017-08-31 ENCOUNTER — Encounter (HOSPITAL_COMMUNITY): Payer: Self-pay | Admitting: Emergency Medicine

## 2017-08-31 ENCOUNTER — Telehealth: Payer: Self-pay | Admitting: Family Medicine

## 2017-08-31 ENCOUNTER — Emergency Department (HOSPITAL_COMMUNITY)
Admission: EM | Admit: 2017-08-31 | Discharge: 2017-08-31 | Disposition: A | Discharge status: Home / Self Care | Payer: Medicare Other | Attending: Emergency Medicine | Admitting: Emergency Medicine

## 2017-08-31 DIAGNOSIS — I130 Hypertensive heart and chronic kidney disease with heart failure and stage 1 through stage 4 chronic kidney disease, or unspecified chronic kidney disease: Secondary | ICD-10-CM | POA: Insufficient documentation

## 2017-08-31 DIAGNOSIS — Z79899 Other long term (current) drug therapy: Secondary | ICD-10-CM | POA: Diagnosis not present

## 2017-08-31 DIAGNOSIS — K8020 Calculus of gallbladder without cholecystitis without obstruction: Secondary | ICD-10-CM | POA: Diagnosis not present

## 2017-08-31 DIAGNOSIS — Z7901 Long term (current) use of anticoagulants: Secondary | ICD-10-CM | POA: Diagnosis not present

## 2017-08-31 DIAGNOSIS — N184 Chronic kidney disease, stage 4 (severe): Secondary | ICD-10-CM | POA: Diagnosis not present

## 2017-08-31 DIAGNOSIS — J449 Chronic obstructive pulmonary disease, unspecified: Secondary | ICD-10-CM | POA: Diagnosis not present

## 2017-08-31 DIAGNOSIS — Z954 Presence of other heart-valve replacement: Secondary | ICD-10-CM | POA: Insufficient documentation

## 2017-08-31 DIAGNOSIS — M48061 Spinal stenosis, lumbar region without neurogenic claudication: Secondary | ICD-10-CM | POA: Diagnosis not present

## 2017-08-31 DIAGNOSIS — R002 Palpitations: Secondary | ICD-10-CM | POA: Insufficient documentation

## 2017-08-31 DIAGNOSIS — R531 Weakness: Secondary | ICD-10-CM | POA: Insufficient documentation

## 2017-08-31 DIAGNOSIS — Z87891 Personal history of nicotine dependence: Secondary | ICD-10-CM | POA: Diagnosis not present

## 2017-08-31 DIAGNOSIS — I5022 Chronic systolic (congestive) heart failure: Secondary | ICD-10-CM | POA: Diagnosis not present

## 2017-08-31 DIAGNOSIS — I110 Hypertensive heart disease with heart failure: Secondary | ICD-10-CM | POA: Diagnosis not present

## 2017-08-31 DIAGNOSIS — E890 Postprocedural hypothyroidism: Secondary | ICD-10-CM | POA: Diagnosis not present

## 2017-08-31 DIAGNOSIS — I428 Other cardiomyopathies: Secondary | ICD-10-CM | POA: Diagnosis not present

## 2017-08-31 DIAGNOSIS — Z95810 Presence of automatic (implantable) cardiac defibrillator: Secondary | ICD-10-CM | POA: Insufficient documentation

## 2017-08-31 DIAGNOSIS — M7061 Trochanteric bursitis, right hip: Secondary | ICD-10-CM | POA: Diagnosis not present

## 2017-08-31 LAB — PROTIME-INR
INR: 1.98
Prothrombin Time: 22.4 seconds — ABNORMAL HIGH (ref 11.4–15.2)

## 2017-08-31 LAB — URINALYSIS, ROUTINE W REFLEX MICROSCOPIC
Bilirubin Urine: NEGATIVE
Glucose, UA: NEGATIVE mg/dL
Hgb urine dipstick: NEGATIVE
Ketones, ur: NEGATIVE mg/dL
Leukocytes, UA: NEGATIVE
Nitrite: NEGATIVE
Protein, ur: NEGATIVE mg/dL
Specific Gravity, Urine: 1.013 (ref 1.005–1.030)
pH: 5 (ref 5.0–8.0)

## 2017-08-31 LAB — BASIC METABOLIC PANEL
Anion gap: 9 (ref 5–15)
BUN: 46 mg/dL — ABNORMAL HIGH (ref 6–20)
CO2: 29 mmol/L (ref 22–32)
Calcium: 9.8 mg/dL (ref 8.9–10.3)
Chloride: 102 mmol/L (ref 101–111)
Creatinine, Ser: 1.89 mg/dL — ABNORMAL HIGH (ref 0.44–1.00)
GFR calc Af Amer: 27 mL/min — ABNORMAL LOW (ref >60)
GFR calc non Af Amer: 23 mL/min — ABNORMAL LOW (ref >60)
Glucose, Bld: 112 mg/dL — ABNORMAL HIGH (ref 65–99)
Potassium: 4.1 mmol/L (ref 3.5–5.1)
Sodium: 140 mmol/L (ref 135–145)

## 2017-08-31 LAB — CBC
HCT: 34.7 % — ABNORMAL LOW (ref 36.0–46.0)
Hemoglobin: 10.6 g/dL — ABNORMAL LOW (ref 12.0–15.0)
MCH: 29.9 pg (ref 26.0–34.0)
MCHC: 30.5 g/dL (ref 30.0–36.0)
MCV: 98 fL (ref 78.0–100.0)
Platelets: 197 10*3/uL (ref 150–400)
RBC: 3.54 MIL/uL — ABNORMAL LOW (ref 3.87–5.11)
RDW: 16 % — ABNORMAL HIGH (ref 11.5–15.5)
WBC: 7.5 10*3/uL (ref 4.0–10.5)

## 2017-08-31 LAB — TROPONIN I
Troponin I: 0.07 ng/mL (ref <0.03)
Troponin I: 0.06 ng/mL (ref <0.03)

## 2017-08-31 LAB — BRAIN NATRIURETIC PEPTIDE: B Natriuretic Peptide: 357 pg/mL — ABNORMAL HIGH (ref 0.0–100.0)

## 2017-08-31 MED ORDER — FUROSEMIDE 10 MG/ML IJ SOLN
40.0000 mg | Freq: Once | INTRAMUSCULAR | Status: AC
  Administered 2017-08-31: 40 mg via INTRAVENOUS
  Filled 2017-08-31: qty 4

## 2017-08-31 NOTE — ED Triage Notes (Signed)
Pt sent over from urgent care for palpitations, generalized weakness, history of CHF and EKG changes. Pt denies pain, shortness of breath.

## 2017-08-31 NOTE — Progress Notes (Signed)
Overnight Cardiology Moonlighter Note  Spoke with Dr. Oleta Mouse in the [facility] ED re: [name] [name] who presents w/ palpitations. Patient found to have elevated troponin to 0.07, follow up 0.06. No chest pain, stable chronic shortness of breath. Complains of occasional palpitations. ECG reveals chronic atrial fibrillation and biventricular pacing.   Reviewed chart and does not appear to have undergone coronary angiography in many years (last in our record system from 2001, at which time she had normal coronary arteries). Has had multiple hospital encounters in the past year with slightly elevated troponin. Has also had some bleeding issues (hemoptysis) on her chronic warfarin therapy (in June 2018). Patient does not seem to be having clinical symptoms consistent with acute coronary syndrome, therefore recommended deferral regarding decision to pursue coronary angiography to outpatient providers. This decision is particularly challenging given the patient's age, comorbidities, and need for chronic anticoagulation.   Recommended interrogation of the patient's pacemaker to rule out malignant arrythmia as the cause of her symptoms. Otherwise appears to be on a good medication regimen. Will forward note to outpatient cardiologist.   [name] Mowers, MD Cardiology Fellow, PGY-5

## 2017-08-31 NOTE — Discharge Instructions (Signed)
Your heart work-up overall does not show any abnormal heart beats or heart rates. You do not seem to be having heart failure symptoms.   Please follow-up with cardiology. Please return without fail for worsening symptoms, including chest pain, difficulty breathing, passing out or any other symptoms concerning to you.

## 2017-08-31 NOTE — ED Notes (Signed)
Pacemaker interrogation done & sent to [employer]. Will await report.

## 2017-08-31 NOTE — ED Notes (Signed)
CRITICAL VALUE ALERT  Critical Value: Troponin 0.07  Date & Time Notied:  08/31/2017 Provider Notified: Dr. Oleta Mouse  Orders Received/Actions taken:

## 2017-08-31 NOTE — Telephone Encounter (Signed)
Annette from [facility] called in to let [facility] know that patient had rapid breathing and shortness of breathe, she was advised to go to the ER.

## 2017-08-31 NOTE — ED Provider Notes (Signed)
[facility] EMERGENCY DEPARTMENT Provider Note   CSN: [id number] Arrival date & time: 08/31/17  1446     History   Chief Complaint Chief Complaint  Patient presents with  . Palpitations    HPI [name] [name] is a 83 y.o. female.  HPI 83 year old female who presents with intermittent palpitations and generalized weakness over the past 3 days.  She has a history of nonischemic cardiomyopathy with EF of 30-35%, COPD, complete heart block with pacemaker and defibrillator, aortic valve and mitral valve replacement on Coumadin, and coronary artery disease.  States intermittent palpitations, primarily when she lays down at night and occasionally while walking.  Has not particularly felt short of breath during this time but does report feeling weak.  Denies any associating chest pain, cough, fevers, chills, vomiting, diarrhea, melena or hematochezia, abdominal pains.  Does feel tight in her legs, but denies any severe swelling.  She was 217 pounds this morning, which is close to her baseline.  She was initially seen at urgent care, and she was sent to the ED for evaluation.   Past Medical History:  Diagnosis Date  . Adenomatous polyp of colon 12/16/2002   Dr. [name]/[facility]  . Allergy   . Cataract   . CHB (complete heart block) ([place]) 10/07/2014      . CHF (congestive heart failure) ([place])       . Chronic kidney disease, stage I 2006  . Constipation       . COPD (chronic obstructive pulmonary disease) ([place])       . DJD (degenerative joint disease) of lumbar spine   . DM (diabetes mellitus) ([place]) 2006  . Esophageal reflux   . Glaucoma   . Gout       . Heart murmur   . Hyperlipemia   . IDA (iron deficiency anemia)    Parenteral iron/[name]  . Insomnia       . Non-ischemic cardiomyopathy ([place])    a. s/p MDT CRTD  . Obesity, unspecified   . Other and unspecified hyperlipidemia   . Oxygen deficiency 2014   nocturnal;  .  Spondylolisthesis   . Spondylosis   . Thyroid cancer ([place])    remote thyroidectomy, no recurrence, pt denies in 2016 thyroid cancer  . Unspecified hypothyroidism       . 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 Vertigo         Patient Active Problem List   Diagnosis Date Noted  . Trochanteric bursitis, right hip 03/07/2017  . SOB (shortness of breath) 11/16/2016  . Persistent atrial fibrillation ([place])   . Nocturnal hypoxia 10/08/2016  . Dependence on nocturnal oxygen therapy 10/08/2016  . S/P ICD (internal cardiac defibrillator) procedure 02/08/2015  . CHB (complete heart block) ([place]) 10/07/2014  . Fatigue 07/20/2014  . Anemia 06/24/2014  . Osteopenia 02/10/2014  . Encounter for therapeutic drug monitoring 10/29/2013  . Spinal stenosis of lumbar region 02/19/2013  . OA (osteoarthritis) of knee 02/19/2013  . Chronic pain of right knee 01/02/2013  . Hemolytic anemia ([place]) 09/24/2012  . H/O adenomatous polyp of colon 05/24/2012  . High risk medication use 05/24/2012  . COPD (chronic obstructive pulmonary disease) ([place]) 05/19/2012  . Chronic systolic heart failure ([place]) 05/19/2012  . Chronic anticoagulation, on coumadin for Mechanical AVR and MVR 04/01/2012  . Cardiorenal syndrome with renal failure 03/22/2012  . S/P AVR (aortic valve replacement) 03/22/2012  . S/P MVR (mitral valve replacement) 03/22/2012  .  AICD (automatic cardioverter/defibrillator) present 03/22/2012  . CKD (chronic kidney disease), stage IV ([place]) 03/22/2012  . Warfarin-induced coagulopathy ([place]) 03/21/2012  . Papilloma of breast 03/06/2012  . Iron deficiency 10/04/2011  . Allergic rhinitis 02/14/2011  . Nausea without vomiting 06/07/2010  . HIP PAIN, RIGHT 04/23/2009  . VARICOSE VEINS LOWER EXTREMITIES W/OTH COMPS 02/09/2009  . Calculus of gallbladder with chronic cholecystitis without obstruction 02/09/2009  . Prediabetes 06/15/2008  . Hypothyroidism, postsurgical 02/24/2008    . Hyperlipidemia LDL goal <70 02/24/2008  . Mild obesity 02/24/2008  . Essential hypertension 02/24/2008  . Non-ischemic cardiomyopathy with ICD 02/24/2008  . GERD 02/24/2008  . DEGENERATIVE JOINT DISEASE, SPINE 02/24/2008    Past Surgical History:  Procedure Laterality Date  . AORTIC AND MITRAL VALVE REPLACEMENT  2001  . BI-VENTRICULAR IMPLANTABLE CARDIOVERTER DEFIBRILLATOR UPGRADE N/A 01/18/2015   Procedure: BI-VENTRICULAR IMPLANTABLE CARDIOVERTER DEFIBRILLATOR UPGRADE;  Surgeon: Evans Lance, MD;  Location: [facility] CATH LAB;  Service: Cardiovascular;  Laterality: N/A;  . CARDIAC CATHETERIZATION    . CARDIAC VALVE REPLACEMENT    . CARDIOVERSION N/A 11/13/2016   Procedure: CARDIOVERSION;  Surgeon: Sanda Klein, MD;  Location: [facility] ENDOSCOPY;  Service: Cardiovascular;  Laterality: N/A;  . COLONOSCOPY  06/12/2007   Rourk- Normal rectum/Normal colon  . COLONOSCOPY  12/16/02   small hemorrhoids  . COLONOSCOPY  06/20/2012   Procedure: COLONOSCOPY;  Surgeon: Daneil Dolin, MD;  Location: [facility] ENDO SUITE;  Service: Endoscopy;  Laterality: N/A;  10:45  . defibrillator implanted 2006    . DOPPLER ECHOCARDIOGRAPHY  2012  . DOPPLER ECHOCARDIOGRAPHY  05,06,07,08,09,2011  . ESOPHAGOGASTRODUODENOSCOPY  06/12/2007   Rourk- normal esophagus, small hiatal hernia, otherwise normal stomach, D1, D2  . EYE SURGERY Right 2005  . EYE SURGERY Left 2006  . ICD LEAD REMOVAL Left 02/08/2015   Procedure: ICD LEAD REMOVAL;  Surgeon: Evans Lance, MD;  Location[facility] OR;  Service: Cardiovascular;  Laterality: Left;  "Will plan extraction and insertion of a BiV PM"  **Dr. Roxan Hockey backing up case**  . IMPLANTABLE CARDIOVERTER DEFIBRILLATOR (ICD) GENERATOR CHANGE Left 02/08/2015   Procedure: ICD GENERATOR CHANGE;  Surgeon: Evans Lance, MD;  Location: [place];  Service: Cardiovascular;  Laterality: Left;  . INSERT / REPLACE / REMOVE PACEMAKER    . PACEMAKER INSERTION  May 2016  . pacemaker placed  2004  . right  breast cyst removed     benign   . right cataract removed     2005  . TEE WITHOUT CARDIOVERSION N/A 11/13/2016   Procedure: TRANSESOPHAGEAL ECHOCARDIOGRAM (TEE);  Surgeon: Sanda Klein, MD;  Location: [facility] ENDOSCOPY;  Service: Cardiovascular;  Laterality: N/A;  . THYROIDECTOMY      OB History    Gravida Para Term Preterm AB Living             0   SAB TAB Ectopic Multiple Live Births                   Home Medications    Prior to Admission medications   Medication Sig Start Date End Date Taking? Authorizing Provider  acetaminophen (TYLENOL) 500 MG tablet Take 500 mg by mouth every 6 (six) hours as needed (pain).   Yes [provider]  albuterol (PROVENTIL) (2.5 MG/3ML) 0.083% nebulizer solution INHALE 1 VIAL VIA NEBULIZER THREE TIMES DAILY. Patient taking differently: INHALE 1 VIAL VIA NEBULIZER THREE TIMES DAILY AS NEEDED 12/28/16  Yes Fayrene Helper, MD  albuterol (VENTOLIN HFA) 108 (90 Base) MCG/ACT inhaler  Inhale 2 puffs into the lungs every 6 (six) hours as needed for wheezing or shortness of breath. 10/19/16  Yes Fayrene Helper, MD  allopurinol (ZYLOPRIM) 100 MG tablet Take 1 tablet (100 mg total) by mouth daily. 05/02/17  Yes Fayrene Helper, MD  azelastine (ASTELIN) 0.1 % nasal spray Place 2 sprays into both nostrils 2 (two) times daily. Use in each nostril as directed Patient taking differently: Place 2 sprays into both nostrils daily as needed for allergies. Use in each nostril as directed 10/04/15  Yes Fayrene Helper, MD  calcitRIOL (ROCALTROL) 0.25 MCG capsule Take 0.25 mcg by mouth daily.  02/13/17  Yes [provider]  calcium carbonate (OS-CAL) 1250 (500 Ca) MG chewable tablet Chew 1 tablet by mouth daily. 07/15/10  Yes [provider]  cholecalciferol (VITAMIN D) 1000 UNITS tablet Take 1,000 Units by mouth daily.   Yes [provider]  COUMADIN 5 MG tablet Take 2 tablets daily except 1 tablet on Tuesdays 05/18/17  Yes Evans Lance, MD  digoxin (LANOXIN) 0.125 MG tablet Take 1 tablet (0.125 mg total) by mouth every other day. 07/23/17  Yes Lendon Colonel, NP  docusate sodium (COLACE) 100 MG capsule Take 300 mg by mouth at bedtime.   Yes [provider]  furosemide (LASIX) 40 MG tablet Take 1 tablet by mouth  daily 07/23/17  Yes Lendon Colonel, NP  gabapentin (NEURONTIN) 300 MG capsule Take 1 capsule (300 mg total) by mouth at bedtime. 04/16/17  Yes Fayrene Helper, MD  hydrALAZINE (APRESOLINE) 25 MG tablet Take 1 tablet (25 mg total) by mouth 3 (three) times daily. 07/23/17  Yes Lendon Colonel, NP  HYDROcodone-acetaminophen (NORCO) 7.5-325 MG tablet Take 1 tablet by mouth every 6 (six) hours as needed for moderate pain (Must last 30 days.Do not drive or operate machinery while taking this medicine.). 07/05/17  Yes Sanjuana Kava, MD  isosorbide mononitrate (IMDUR) 60 MG 24 hr tablet Take 1 tablet (60 mg total) by mouth daily. 07/23/17  Yes Lendon Colonel, NP  losartan (COZAAR) 50 MG tablet take 1 tablet by mouth once daily 06/20/17  Yes Fayrene Helper, MD  meclizine (ANTIVERT) 25 MG tablet Take 1 tablet (25 mg total) by mouth 3 (three) times daily as needed for dizziness. 05/01/17  Yes Fayrene Helper, MD  metolazone (ZAROXOLYN) 2.5 MG tablet TAKE 1 TABLET ONCE A WEEK 07/23/17  Yes Lendon Colonel, NP  Polyethyl [name] (SYSTANE OP) Place 1 drop into both eyes daily as needed (dry eyes).   Yes [provider]  potassium chloride SA (KLOR-CON M20) 20 MEQ tablet Take 1 tablet by mouth  daily 07/23/17  Yes Lendon Colonel, NP  pravastatin (PRAVACHOL) 40 MG tablet Take 1 tablet (40 mg total) by mouth every evening. 07/23/17 10/21/17 Yes Lendon Colonel, NP  SYNTHROID 75 MCG tablet Take 1 tablet by mouth  daily 05/01/17  Yes Fayrene Helper, MD  TOPROL XL 100 MG 24 hr tablet Take 1 tablet (100 mg total) by mouth daily. Take with or immediately  following a meal. 07/23/17  Yes Lendon Colonel, NP  UNABLE TO Lorayne Marek Gilford Rile with seat x 1 03/22/17  Yes Fayrene Helper, MD  fluticasone (FLONASE) 50 MCG/ACT nasal spray USE 2 SPRAYS IN EACH       NOSTRIL ONCE DAILY Patient taking differently: USE 2 SPRAYS IN EACH       NOSTRIL ONCE DAILY AS NEEDED FOR  CONGESTION 10/25/16   Fayrene Helper, MD  folic acid (FOLVITE) 1 MG tablet Take 1 tablet (1 mg total) by mouth daily. 05/01/17   Fayrene Helper, MD  ondansetron (ZOFRAN) 4 MG tablet Take 1 tablet (4 mg total) by mouth daily as needed for nausea or vomiting. 05/01/17   Fayrene Helper, MD  promethazine-dextromethorphan (PROMETHAZINE-DM) 6.25-15 MG/5ML syrup One teaspoon at bedtime, as needed, for excessive cough Patient taking differently: Take 5 mLs by mouth at bedtime as needed for cough. as needed, for excessive cough 10/04/16   Fayrene Helper, MD    Family History Family History  Problem Relation Age of Onset  . Pancreatic cancer Mother   . Heart disease Father   . Heart disease Sister   . Heart attack Sister   . Heart disease Brother   . Diabetes Brother   . Heart disease Brother   . Diabetes Brother   . Hypertension Brother   . Lung cancer Brother     Social History Social History   Tobacco Use  . Smoking status: Former Smoker    Packs/day: 0.75    Years: 40.00    Pack years: 30.00    Types: Cigarettes    Last attempt to quit: 03/08/1987    Years since quitting: 30.5  . Smokeless tobacco: Never Used  Substance Use Topics  . Alcohol use: No    Alcohol/week: 0.0 oz  . Drug use: No     Allergies   Penicillins; Aspirin; Fentanyl; and Niacin   Review of Systems Review of Systems  Constitutional: Positive for fatigue. Negative for fever.  Respiratory: Negative for shortness of breath.   Cardiovascular: Positive for palpitations. Negative for chest pain.  Gastrointestinal: Negative for abdominal pain, diarrhea, nausea and vomiting.  All other  systems reviewed and are negative.    Physical Exam Updated Vital Signs BP 139/78   Pulse 70   Temp 98.4 F (36.9 C) (Oral)   Resp 20   Ht 5\' 4"  (1.626 m)   Wt 99.8 kg (220 lb)   SpO2 98%   BMI 37.76 kg/m   Physical Exam Physical Exam  Nursing note and vitals reviewed. Constitutional: Well developed, well nourished, non-toxic, and in no acute distress Head: Normocephalic and atraumatic.  Mouth/Throat: Oropharynx is clear and moist.  Neck: Normal range of motion. Neck supple.  Cardiovascular: Normal rate and regular rhythm.   Pulmonary/Chest: Effort normal and breath sounds normal.  Abdominal: Soft. There is no tenderness. There is no rebound and no guarding.  Musculoskeletal: Normal range of motion. trace bilateral edema Neurological: Alert, no facial droop, fluent speech, moves all extremities symmetrically Skin: Skin is warm and dry.  Psychiatric: Cooperative   ED Treatments / Results  Labs (all labs ordered are listed, but only abnormal results are displayed) Labs Reviewed  BASIC METABOLIC PANEL - Abnormal; Notable for the following components:      Result Value   Glucose, Bld 112 (*)    BUN 46 (*)    Creatinine, Ser 1.89 (*)    GFR calc non Af Amer 23 (*)    GFR calc Af Amer 27 (*)    All other components within normal limits  CBC - Abnormal; Notable for the following components:   RBC 3.54 (*)    Hemoglobin 10.6 (*)    HCT 34.7 (*)    RDW 16.0 (*)    All other components within normal limits  TROPONIN I - Abnormal; Notable for the following components:  Troponin I 0.07 (*)    All other components within normal limits  BRAIN NATRIURETIC PEPTIDE - Abnormal; Notable for the following components:   B Natriuretic Peptide 357.0 (*)    All other components within normal limits  PROTIME-INR - Abnormal; Notable for the following components:   Prothrombin Time 22.4 (*)    All other components within normal limits  TROPONIN I - Abnormal; Notable for the  following components:   Troponin I 0.06 (*)    All other components within normal limits  URINALYSIS, ROUTINE W REFLEX MICROSCOPIC    EKG  EKG Interpretation  Date/Time:  Friday August 31 2017 14:51:01 EST Ventricular Rate:  70 PR Interval:    QRS Duration: 172 QT Interval:  494 QTC Calculation: 533 R Axis:   117 Text Interpretation:  Ventricular-paced rhythm Abnormal ECG similar to last EKG 06/20/2017 Confirmed by Brantley Stage [phone]) on 08/31/2017 6:04:18 PM       Radiology Dg Chest 2 View  Result Date: 08/31/2017 CLINICAL DATA:  Chest palpitations and dyspnea x3 days. EXAM: CHEST  2 VIEW COMPARISON:  06/20/2017, 11/16/2016 CT FINDINGS: Stable cardiomegaly. Stable mild central vascular congestion. Moderate aortic atherosclerosis at the arch. Left-sided ICD device with leads in the coronary sinus, right atrium and right ventricle. Status post median sternotomy and cardiac valvular replacement. No alveolar consolidations. Fine reticular markings are again noted bilaterally new, possibly representing chronic mild bronchiolitis decreased on prior CT. No effusion or pneumothorax. IMPRESSION: Stable cardiomegaly with aortic atherosclerosis and central vascular congestion. Stable fine diffuse reticular markings within both lungs, nonspecific but based on prior CT may represent areas of mild bronchiolitis. Electronically Signed   By: Ashley Royalty M.D.   On: 08/31/2017 15:36    Procedures Procedures (including critical care time)  Medications Ordered in ED Medications  furosemide (LASIX) injection 40 mg (40 mg Intravenous Given 08/31/17 2236)     Initial Impression / Assessment and Plan / ED Course  I have reviewed the triage vital signs and the nursing notes.  Pertinent labs & imaging results that were available during my care of the patient were reviewed by me and considered in my medical decision making (see chart for details).     83 year old female who presents with intermittent  palpitations and weakness.  She is nontoxic in no acute distress.  Vital signs are within normal limits.  EKG shows paced rhythm, with underlying atrial fibrillation which is chronic for her.  Given newer symptoms I doubt that it is related to her atrial fibrillation.  Clinically she does not appear fluid overloaded or have symptoms of CHF exacerbation.  She has slight worsening of her creatinine to 1.89 from a baseline of 1.6-1.7.  Her troponin and also also mildly elevated at 0.07, with serial troponin downtrending to 0.06.  She chronically has elevation of her troponin but typically between 0.03-0.04.  Her symptoms seems very atypical for ACS.  There are no EKG changes to suggest ischemia or infarction.  She had mild vascular congestion on chest x-ray stable cardiomegaly.  There is no pneumonia.  There is no UTI.  Her pacemaker is interrogated and she has one episode of atrial tachycardia HR 70s, but this is something she has had before. No other arrhythmias. There is increased fluid impedence on her device per pacemaker representation suggestive of early fluid overload. Given no dyspnea, hypoxia or other fluid overload symptoms did her single dose IV lasix. She is felt stable for discharge. Also discussed with Dr. Emilio Aspen from  cardiology regarding elevated troponin. He feels if device interrogation unremarkable she can be discharged. Acknowledges chronic troponin elevated, and unlikely to catheterize in the hospital. Recommended discharge with cardiology follow-up if device interrogation unremarkable.   Strict return and follow-up instructions reviewed. She expressed understanding of all discharge instructions and felt comfortable with the plan of care.   Final Clinical Impressions(s) / ED Diagnoses   Final diagnoses:  Palpitations    ED Discharge Orders    None       Forde Dandy, MD 08/31/17 2241

## 2017-09-04 ENCOUNTER — Telehealth: Payer: Self-pay | Admitting: Orthopaedic Surgery

## 2017-09-04 ENCOUNTER — Ambulatory Visit: Payer: Medicare Other | Admitting: Orthopaedic Surgery

## 2017-09-04 DIAGNOSIS — M7061 Trochanteric bursitis, right hip: Secondary | ICD-10-CM | POA: Diagnosis not present

## 2017-09-04 DIAGNOSIS — I5022 Chronic systolic (congestive) heart failure: Secondary | ICD-10-CM | POA: Diagnosis not present

## 2017-09-04 DIAGNOSIS — I428 Other cardiomyopathies: Secondary | ICD-10-CM | POA: Diagnosis not present

## 2017-09-04 DIAGNOSIS — K8020 Calculus of gallbladder without cholecystitis without obstruction: Secondary | ICD-10-CM | POA: Diagnosis not present

## 2017-09-04 DIAGNOSIS — I110 Hypertensive heart disease with heart failure: Secondary | ICD-10-CM | POA: Diagnosis not present

## 2017-09-04 DIAGNOSIS — M48061 Spinal stenosis, lumbar region without neurogenic claudication: Secondary | ICD-10-CM | POA: Diagnosis not present

## 2017-09-04 NOTE — Telephone Encounter (Signed)
Noted and agree. 

## 2017-09-04 NOTE — Telephone Encounter (Signed)
Pt was seen at the ER with instructions for cardiology follow up

## 2017-09-04 NOTE — Telephone Encounter (Signed)
Patient was scheduled to come in but is unable to do so because of the bad road conditions.  She would like a refill on Hydrocodone/Acetaminophen 7.5-325  Mgs.  Qty 70       Sig: Take 1 tablet by mouth every 6 (six) hours as needed for moderate pain (Must last 30 days.Do not drive or operate machinery while taking this medicine.).   Her local pharmacy was verified as [facility]

## 2017-09-05 MED ORDER — HYDROCODONE-ACETAMINOPHEN 7.5-325 MG PO TABS: 1.0000 | ORAL_TABLET | Freq: Four times a day (QID) | ORAL | 0 refills | Status: DC | PRN

## 2017-09-06 ENCOUNTER — Telehealth: Payer: Self-pay | Admitting: Family Medicine

## 2017-09-06 NOTE — Telephone Encounter (Signed)
[facility] advised that the patient has stop therapy this week as she is waiting for pain medicine

## 2017-09-10 ENCOUNTER — Ambulatory Visit (INDEPENDENT_AMBULATORY_CARE_PROVIDER_SITE_OTHER): Payer: Medicare Other | Admitting: *Deleted

## 2017-09-10 DIAGNOSIS — Z5181 Encounter for therapeutic drug level monitoring: Secondary | ICD-10-CM

## 2017-09-10 DIAGNOSIS — Z952 Presence of prosthetic heart valve: Secondary | ICD-10-CM | POA: Diagnosis not present

## 2017-09-10 LAB — POCT INR: INR: 2.2

## 2017-09-11 DIAGNOSIS — I5022 Chronic systolic (congestive) heart failure: Secondary | ICD-10-CM | POA: Diagnosis not present

## 2017-09-11 DIAGNOSIS — M48061 Spinal stenosis, lumbar region without neurogenic claudication: Secondary | ICD-10-CM | POA: Diagnosis not present

## 2017-09-11 DIAGNOSIS — I110 Hypertensive heart disease with heart failure: Secondary | ICD-10-CM | POA: Diagnosis not present

## 2017-09-11 DIAGNOSIS — K8020 Calculus of gallbladder without cholecystitis without obstruction: Secondary | ICD-10-CM | POA: Diagnosis not present

## 2017-09-11 DIAGNOSIS — M7061 Trochanteric bursitis, right hip: Secondary | ICD-10-CM | POA: Diagnosis not present

## 2017-09-11 DIAGNOSIS — I428 Other cardiomyopathies: Secondary | ICD-10-CM | POA: Diagnosis not present

## 2017-09-11 NOTE — Telephone Encounter (Signed)
[facility], she did get a rx from dr Luna Glasgow for norco on 12 12 18. She has gotten the rx, states she has to " get it back in her system before she can do therapy.

## 2017-09-13 DIAGNOSIS — I110 Hypertensive heart disease with heart failure: Secondary | ICD-10-CM | POA: Diagnosis not present

## 2017-09-13 DIAGNOSIS — K8020 Calculus of gallbladder without cholecystitis without obstruction: Secondary | ICD-10-CM | POA: Diagnosis not present

## 2017-09-13 DIAGNOSIS — I428 Other cardiomyopathies: Secondary | ICD-10-CM | POA: Diagnosis not present

## 2017-09-13 DIAGNOSIS — M48061 Spinal stenosis, lumbar region without neurogenic claudication: Secondary | ICD-10-CM | POA: Diagnosis not present

## 2017-09-13 DIAGNOSIS — I5022 Chronic systolic (congestive) heart failure: Secondary | ICD-10-CM | POA: Diagnosis not present

## 2017-09-13 DIAGNOSIS — M7061 Trochanteric bursitis, right hip: Secondary | ICD-10-CM | POA: Diagnosis not present

## 2017-09-14 ENCOUNTER — Telehealth: Payer: Self-pay | Admitting: Internal Medicine

## 2017-09-14 MED ORDER — COUMADIN 5 MG PO TABS: ORAL_TABLET | ORAL | 6 refills | Status: DC

## 2017-09-14 NOTE — Telephone Encounter (Signed)
Refill complete 

## 2017-09-14 NOTE — Telephone Encounter (Signed)
Would like to know if we could send a Rx in for her COUMADIN 5 MG tablet [[id number]] to [facility] in [place],  Her mail order hasn't arrived yet and she's about to run out

## 2017-09-19 DIAGNOSIS — M48061 Spinal stenosis, lumbar region without neurogenic claudication: Secondary | ICD-10-CM | POA: Diagnosis not present

## 2017-09-19 DIAGNOSIS — K8020 Calculus of gallbladder without cholecystitis without obstruction: Secondary | ICD-10-CM | POA: Diagnosis not present

## 2017-09-19 DIAGNOSIS — I5022 Chronic systolic (congestive) heart failure: Secondary | ICD-10-CM | POA: Diagnosis not present

## 2017-09-19 DIAGNOSIS — M7061 Trochanteric bursitis, right hip: Secondary | ICD-10-CM | POA: Diagnosis not present

## 2017-09-19 DIAGNOSIS — I428 Other cardiomyopathies: Secondary | ICD-10-CM | POA: Diagnosis not present

## 2017-09-19 DIAGNOSIS — I110 Hypertensive heart disease with heart failure: Secondary | ICD-10-CM | POA: Diagnosis not present

## 2017-09-19 NOTE — Progress Notes (Signed)
[name] [name], [name] [name] [id number]   CLINIC:  Medical Oncology/Hematology  PCP:  Fayrene Helper, MD [address], [address] [place] [address] [phone]   REASON FOR VISIT:  Follow-up for chronic anemia   CURRENT THERAPY: Procrit 40,000 units SQ every 6 weeks     HISTORY OF PRESENT ILLNESS:  (From Kirby Crigler, PA-C's last note on 11/29/16)       INTERVAL HISTORY:  Ms. Kerney 83 y.o. female returns for follow-up for anemia secondary to CKD.   Here today unaccompanied.   Overall, she tells me she has been feeling "pretty good."  Appetite and energy levels both 25%.  She has occasional fatigue and feels weak at times. Continues to ambulate with a cane.  "I just wish I had more energy, but I guess at 83 years old, my energy has all gone away."    She has chronic issues with dyspnea on exertion and leg swelling; these problems are no worse. She has constipation; she takes Miralax and stool softeners as needed, which are helpful.  Reports intermittent nausea with certain foods she may eat; anti-emetics are helpful.  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   From a hematologic standpoint, she is doing well. Denies any blood in her stools, dark/tarry stools, hematuria, vaginal bleeding, hemoptysis, hematemesis, or gingival bleeding. She has occasional blood-tinged output on the tissue when she blows her nose; denies large volume nosebleeds.     REVIEW OF SYSTEMS:  Review of Systems  Constitutional: Positive for fatigue.  HENT:  Negative.   Eyes: Negative.   Respiratory: Positive for shortness of breath. Negative for hemoptysis.   Cardiovascular: Positive for leg swelling.  Gastrointestinal: Positive for constipation and nausea. Negative for blood in stool and vomiting.  Endocrine: Negative.   Genitourinary:  Negative.  Negative for hematuria and vaginal bleeding.   Musculoskeletal: Positive for arthralgias and back pain.  Skin: Negative.  Negative for rash.  Neurological: Positive for extremity weakness and numbness.  Hematological: Negative.   Psychiatric/Behavioral: Negative.      PAST MEDICAL/SURGICAL HISTORY:  Past Medical History:  Diagnosis Date  . Adenomatous polyp of colon 12/16/2002   Dr. [name]/[facility]  . Allergy   . Cataract   . CHB (complete heart block) ([place]) 10/07/2014      . CHF (congestive heart failure) ([place])       . Chronic kidney disease, stage I 2006  . Constipation       . COPD (chronic obstructive pulmonary disease) ([place])       . DJD (degenerative joint disease) of lumbar spine   . DM (diabetes mellitus) ([place]) 2006  . Esophageal reflux   . Glaucoma   . Gout       . Heart murmur   . Hyperlipemia   . IDA (iron deficiency anemia)    Parenteral iron/[name]  . Insomnia       . Non-ischemic cardiomyopathy ([place])    a. s/p MDT CRTD  . Obesity, unspecified   . Other and unspecified hyperlipidemia   . Oxygen deficiency 2014   nocturnal;  . Spondylolisthesis   . Spondylosis   . Thyroid cancer ([place])    remote thyroidectomy, no recurrence, pt denies in 2016 thyroid cancer  . Unspecified hypothyroidism       . Varicose veins of  lower extremities with other complications   . Vertigo        Past Surgical History:  Procedure Laterality Date  . AORTIC AND MITRAL VALVE REPLACEMENT  2001  . BI-VENTRICULAR IMPLANTABLE CARDIOVERTER DEFIBRILLATOR UPGRADE N/A 01/18/2015   Procedure: BI-VENTRICULAR IMPLANTABLE CARDIOVERTER DEFIBRILLATOR UPGRADE;  Surgeon: Evans Lance, MD;  Location: [facility] CATH LAB;  Service: Cardiovascular;  Laterality: N/A;  . CARDIAC CATHETERIZATION    . CARDIAC VALVE REPLACEMENT    . CARDIOVERSION N/A 11/13/2016   Procedure: CARDIOVERSION;  Surgeon: Sanda Klein, MD;  Location: [facility] ENDOSCOPY;  Service:  Cardiovascular;  Laterality: N/A;  . COLONOSCOPY  06/12/2007   Rourk- Normal rectum/Normal colon  . COLONOSCOPY  12/16/02   small hemorrhoids  . COLONOSCOPY  06/20/2012   Procedure: COLONOSCOPY;  Surgeon: Daneil Dolin, MD;  Location: [facility] ENDO SUITE;  Service: Endoscopy;  Laterality: N/A;  10:45  . defibrillator implanted 2006    . DOPPLER ECHOCARDIOGRAPHY  2012  . DOPPLER ECHOCARDIOGRAPHY  05,06,07,08,09,2011  . ESOPHAGOGASTRODUODENOSCOPY  06/12/2007   Rourk- normal esophagus, small hiatal hernia, otherwise normal stomach, D1, D2  . EYE SURGERY Right 2005  . EYE SURGERY Left 2006  . ICD LEAD REMOVAL Left 02/08/2015   Procedure: ICD LEAD REMOVAL;  Surgeon: Evans Lance, MD;  Location: [facility] OR;  Service: Cardiovascular;  Laterality: Left;  "Will plan extraction and insertion of a BiV PM"  **Dr. Roxan Hockey backing up case**  . IMPLANTABLE CARDIOVERTER DEFIBRILLATOR (ICD) GENERATOR CHANGE Left 02/08/2015   Procedure: ICD GENERATOR CHANGE;  Surgeon: Evans Lance, MD;  Location: [facility];  Service: Cardiovascular;  Laterality: Left;  . INSERT / REPLACE / REMOVE PACEMAKER    . PACEMAKER INSERTION  May 2016  . pacemaker placed  2004  . right breast cyst removed     benign   . right cataract removed     2005  . TEE WITHOUT CARDIOVERSION N/A 11/13/2016   Procedure: TRANSESOPHAGEAL ECHOCARDIOGRAM (TEE);  Surgeon: Sanda Klein, MD;  Location[facility] ENDOSCOPY;  Service: Cardiovascular;  Laterality: N/A;  . THYROIDECTOMY       SOCIAL HISTORY:  Social History   Socioeconomic History  . Marital status: Widowed    Spouse name: Not on file  . Number of children: 0  . Years of education: Not on file  . Highest education level: Not on file  Social Needs  . Financial resource strain: Not on file  . Food insecurity - worry: Not on file  . Food insecurity - inability: Not on file  . Transportation needs - medical: Not on file  . Transportation needs - non-medical: Not on file  Occupational  History  . Occupation: retired    Fish farm manager: RETIRED  Tobacco Use  . Smoking status: Former Smoker    Packs/day: 0.75    Years: 40.00    Pack years: 30.00    Types: Cigarettes    Last attempt to quit: 03/08/1987    Years since quitting: 30.5  . Smokeless tobacco: Never Used  Substance and Sexual Activity  . Alcohol use: No    Alcohol/week: 0.0 oz  . Drug use: No  . Sexual activity: Not Currently  Other Topics Concern  . Not on file  Social History Narrative   From [place] (2004)    FAMILY HISTORY:  Family History  Problem Relation Age of Onset  . Pancreatic cancer Mother   . Heart disease Father   . Heart disease Sister   . Heart attack Sister   .  Heart disease Brother   . Diabetes Brother   . Heart disease Brother   . Diabetes Brother   . Hypertension Brother   . Lung cancer Brother     CURRENT MEDICATIONS:  Outpatient Encounter Medications as of 09/20/2017  Medication Sig Note  . acetaminophen (TYLENOL) 500 MG tablet Take 500 mg by mouth every 6 (six) hours as needed (pain).   Marland Kitchen albuterol (PROVENTIL) (2.5 MG/3ML) 0.083% nebulizer solution INHALE 1 VIAL VIA NEBULIZER THREE TIMES DAILY. (Patient taking differently: INHALE 1 VIAL VIA NEBULIZER THREE TIMES DAILY AS NEEDED)   . albuterol (VENTOLIN HFA) 108 (90 Base) MCG/ACT inhaler Inhale 2 puffs into the lungs every 6 (six) hours as needed for wheezing or shortness of breath.   . allopurinol (ZYLOPRIM) 100 MG tablet Take 1 tablet (100 mg total) by mouth daily.   Marland Kitchen azelastine (ASTELIN) 0.1 % nasal spray Place 2 sprays into both nostrils 2 (two) times daily. Use in each nostril as directed (Patient taking differently: Place 2 sprays into both nostrils daily as needed for allergies. Use in each nostril as directed)   . calcitRIOL (ROCALTROL) 0.25 MCG capsule Take 0.25 mcg by mouth daily.  08/02/2017: Last filled 07/19/17 with instructions to take mondays, wednesdays and fridays 1 tab  . calcium carbonate (OS-CAL) 1250 (500  Ca) MG chewable tablet Chew 1 tablet by mouth daily.   . cholecalciferol (VITAMIN D) 1000 UNITS tablet Take 1,000 Units by mouth daily.   Marland Kitchen COUMADIN 5 MG tablet Take 2 tablets daily except 1 tablet on Tuesdays   . digoxin (LANOXIN) 0.125 MG tablet Take 1 tablet (0.125 mg total) by mouth every other day.   . docusate sodium (COLACE) 100 MG capsule Take 300 mg by mouth at bedtime.   . fluticasone (FLONASE) 50 MCG/ACT nasal spray USE 2 SPRAYS IN EACH       NOSTRIL ONCE DAILY (Patient taking differently: USE 2 SPRAYS IN EACH       NOSTRIL ONCE DAILY AS NEEDED FOR CONGESTION)   . folic acid (FOLVITE) 1 MG tablet Take 1 tablet (1 mg total) by mouth daily.   . furosemide (LASIX) 40 MG tablet Take 1 tablet by mouth  daily   . gabapentin (NEURONTIN) 300 MG capsule Take 1 capsule (300 mg total) by mouth at bedtime.   . hydrALAZINE (APRESOLINE) 25 MG tablet Take 1 tablet (25 mg total) by mouth 3 (three) times daily.   Marland Kitchen HYDROcodone-acetaminophen (NORCO) 7.5-325 MG tablet Take 1 tablet by mouth every 6 (six) hours as needed for moderate pain (Must last 30 days.Do not drive or operate machinery while taking this medicine.).   Marland Kitchen isosorbide mononitrate (IMDUR) 60 MG 24 hr tablet Take 1 tablet (60 mg total) by mouth daily.   Marland Kitchen losartan (COZAAR) 50 MG tablet take 1 tablet by mouth once daily   . meclizine (ANTIVERT) 25 MG tablet Take 1 tablet (25 mg total) by mouth 3 (three) times daily as needed for dizziness.   . metolazone (ZAROXOLYN) 2.5 MG tablet TAKE 1 TABLET ONCE A WEEK   . ondansetron (ZOFRAN) 4 MG tablet Take 1 tablet (4 mg total) by mouth daily as needed for nausea or vomiting.   [name] [name] (SYSTANE OP) Place 1 drop into both eyes daily as needed (dry eyes).   . potassium chloride SA (KLOR-CON M20) 20 MEQ tablet Take 1 tablet by mouth  daily   . pravastatin (PRAVACHOL) 40 MG tablet Take 1 tablet (40 mg total) by mouth  every evening.   . promethazine-dextromethorphan  (PROMETHAZINE-DM) 6.25-15 MG/5ML syrup One teaspoon at bedtime, as needed, for excessive cough (Patient taking differently: Take 5 mLs by mouth at bedtime as needed for cough. as needed, for excessive cough)   . SYNTHROID 75 MCG tablet Take 1 tablet by mouth  daily   . TOPROL XL 100 MG 24 hr tablet Take 1 tablet (100 mg total) by mouth daily. Take with or immediately following a meal.   . UNABLE TO FIND Walker with seat x 1    Facility-Administered Encounter Medications as of 09/20/2017  Medication  . epoetin alfa (EPOGEN,PROCRIT) injection 40,000 Units    ALLERGIES:  Allergies  Allergen Reactions  . Penicillins Other (See Comments)    Bruise Has patient had a PCN reaction causing immediate rash, facial/tongue/throat swelling, SOB or lightheadedness with hypotension: no Has patient had a PCN reaction causing severe rash involving mucus membranes or skin necrosis: no Has patient had a PCN reaction that required hospitalization: no Has patient had a PCN reaction occurring within the last 10 years: no If all of the above answers are "NO", then may proceed with Cephalosporin use.   . Aspirin Other (See Comments)    On coumadin  . Fentanyl Dermatitis    Rash with patch   . Niacin Itching     PHYSICAL EXAM:  ECOG Performance status: 2 - Symptomatic; requires occasional assistance   Vitals:   09/20/17 1147  BP: (!) 102/56  Pulse: 73  Resp: 18  Temp: 98.2 F (36.8 C)  SpO2: 96%   Filed Weights   09/20/17 1147  Weight: 217 lb (98.4 kg)    Physical Exam  Constitutional: She is oriented to person, place, and time.  Elderly female in no acute distress  -Physical exam done with pt seated in wheelchair   HENT:  Head: Normocephalic.  Mouth/Throat: Oropharynx is clear and moist. No oropharyngeal exudate.  Eyes: Conjunctivae are normal. Pupils are equal, round, and reactive to light. No scleral icterus.  Neck: Normal range of motion. Neck supple.  Cardiovascular: Normal rate  and regular rhythm.  Systolic click  Pulmonary/Chest: Effort normal and breath sounds normal. No respiratory distress. She has no wheezes.  Abdominal: Soft. Bowel sounds are normal. There is no tenderness.  Musculoskeletal: She exhibits edema (Trace BLE/ankle edema ).  Walks with cane   Lymphadenopathy:    She has no cervical adenopathy.  Neurological: She is alert and oriented to person, place, and time.  Skin: Skin is warm and dry. No rash noted.  Psychiatric: Mood, memory, affect and judgment normal.     LABORATORY DATA:  I have reviewed the labs as listed.  CBC    Component Value Date/Time   WBC 6.6 09/20/2017 1107   RBC 3.34 (L) 09/20/2017 1107   HGB 10.1 (L) 09/20/2017 1107   HCT 32.1 (L) 09/20/2017 1107   PLT 172 09/20/2017 1107   MCV 96.1 09/20/2017 1107   MCH 30.2 09/20/2017 1107   MCHC 31.5 09/20/2017 1107   RDW 16.0 (H) 09/20/2017 1107   LYMPHSABS 0.8 09/20/2017 1107   MONOABS 0.6 09/20/2017 1107   EOSABS 0.0 09/20/2017 1107   BASOSABS 0.0 09/20/2017 1107   CMP Latest Ref Rng & Units 09/20/2017 08/31/2017 06/20/2017  Glucose 65 - 99 mg/dL 99 112(H) 126(H)  BUN 6 - 20 mg/dL 52(H) 46(H) 39(H)  Creatinine 0.44 - 1.00 mg/dL 1.73(H) 1.89(H) 1.69(H)  Sodium 135 - 145 mmol/L 138 140 135  Potassium 3.5 - 5.1 mmol/L  4.0 4.1 4.9  Chloride 101 - 111 mmol/L 101 102 98(L)  CO2 22 - 32 mmol/L 26 29 29   Calcium 8.9 - 10.3 mg/dL 9.5 9.8 9.5  Total Protein 6.5 - 8.1 g/dL - - -  Total Bilirubin 0.3 - 1.2 mg/dL - - -  Alkaline Phos 38 - 126 U/L - - -  AST 15 - 41 U/L - - -  ALT 14 - 54 U/L - - -    PENDING LABS:  Ferritin pending  DIAGNOSTIC IMAGING:    PATHOLOGY:     ASSESSMENT & PLAN:   Anemia:  -Thought to primarily secondary to stage 4 chronic kidney disease, as well as possible hemolysis with mechanical mitral and aortic valve replacements. Also may have element of iron deficiency as well.  -Procrit initially started ~3 years ago, with overall stability in  her hemoglobin.  At her last follow-up visit, we were able to move her Procrit injections to every 6 weeks.  Hgb trends reviewed and have remained largely stable.   -Hgb 10.1 g/dL today. Will receive Procrit today per protocol.  -Ferritin has been elevated in the past, which is likely reactive given CKD.  Remainder of iron studies in 04/2017 were normal. Will add-on iron studies to her CBC blood work every 6 weeks to ensure stability.  -Continue Procrit 40,000 units every 6 weeks.  -Return to cancer center in 4 months for follow-up with labs and subsequent Procrit injection.    Chronic arthralgias:  -Managed by Dr. Luna Glasgow. He manages her pain medications as well.        Dispo:  -CBC and Procrit every 6 weeks. (CBC with diff and iron studies; if her iron studies remain stable, then they can be d/c'd in the future) -Return to cancer center in 4 months for follow-up with labs and subsequent injection. (CBC with diff, CMET,& iron studies)   All questions were answered to patient's stated satisfaction. Encouraged patient to call with any new concerns or questions before her next visit to the cancer center and we can certain see her sooner, if needed.      Orders placed this encounter:  Orders Placed This Encounter  Procedures  . CBC with Differential/Platelet  . Comprehensive metabolic panel  . Ferritin  . Iron and TIBC      Mike Craze, NP [facility] [phone]

## 2017-09-20 ENCOUNTER — Encounter (HOSPITAL_COMMUNITY): Payer: Self-pay

## 2017-09-20 ENCOUNTER — Encounter (HOSPITAL_COMMUNITY): Payer: Medicare Other | Attending: Oncology

## 2017-09-20 ENCOUNTER — Encounter (HOSPITAL_COMMUNITY): Payer: Self-pay | Admitting: Adult Health

## 2017-09-20 ENCOUNTER — Encounter (HOSPITAL_BASED_OUTPATIENT_CLINIC_OR_DEPARTMENT_OTHER): Payer: Medicare Other | Admitting: Adult Health

## 2017-09-20 ENCOUNTER — Encounter (HOSPITAL_BASED_OUTPATIENT_CLINIC_OR_DEPARTMENT_OTHER): Payer: Medicare Other

## 2017-09-20 ENCOUNTER — Other Ambulatory Visit: Payer: Self-pay

## 2017-09-20 VITALS — BP 102/56 | HR 73 | Temp 98.2°F | Resp 18 | Ht 64.0 in | Wt 217.0 lb

## 2017-09-20 DIAGNOSIS — J449 Chronic obstructive pulmonary disease, unspecified: Secondary | ICD-10-CM | POA: Insufficient documentation

## 2017-09-20 DIAGNOSIS — Z8249 Family history of ischemic heart disease and other diseases of the circulatory system: Secondary | ICD-10-CM | POA: Insufficient documentation

## 2017-09-20 DIAGNOSIS — I509 Heart failure, unspecified: Secondary | ICD-10-CM | POA: Diagnosis not present

## 2017-09-20 DIAGNOSIS — Z6837 Body mass index (BMI) 37.0-37.9, adult: Secondary | ICD-10-CM | POA: Insufficient documentation

## 2017-09-20 DIAGNOSIS — D649 Anemia, unspecified: Secondary | ICD-10-CM

## 2017-09-20 DIAGNOSIS — M549 Dorsalgia, unspecified: Secondary | ICD-10-CM

## 2017-09-20 DIAGNOSIS — E785 Hyperlipidemia, unspecified: Secondary | ICD-10-CM | POA: Diagnosis not present

## 2017-09-20 DIAGNOSIS — M6281 Muscle weakness (generalized): Secondary | ICD-10-CM

## 2017-09-20 DIAGNOSIS — E611 Iron deficiency: Secondary | ICD-10-CM | POA: Insufficient documentation

## 2017-09-20 DIAGNOSIS — D631 Anemia in chronic kidney disease: Secondary | ICD-10-CM | POA: Diagnosis not present

## 2017-09-20 DIAGNOSIS — M2550 Pain in unspecified joint: Secondary | ICD-10-CM

## 2017-09-20 DIAGNOSIS — Z952 Presence of prosthetic heart valve: Secondary | ICD-10-CM | POA: Diagnosis not present

## 2017-09-20 DIAGNOSIS — Z7901 Long term (current) use of anticoagulants: Secondary | ICD-10-CM | POA: Insufficient documentation

## 2017-09-20 DIAGNOSIS — G4700 Insomnia, unspecified: Secondary | ICD-10-CM | POA: Insufficient documentation

## 2017-09-20 DIAGNOSIS — N184 Chronic kidney disease, stage 4 (severe): Secondary | ICD-10-CM

## 2017-09-20 DIAGNOSIS — K5900 Constipation, unspecified: Secondary | ICD-10-CM | POA: Diagnosis not present

## 2017-09-20 DIAGNOSIS — Z833 Family history of diabetes mellitus: Secondary | ICD-10-CM | POA: Insufficient documentation

## 2017-09-20 DIAGNOSIS — Z800 Family history of malignant neoplasm of digestive organs: Secondary | ICD-10-CM | POA: Insufficient documentation

## 2017-09-20 DIAGNOSIS — I428 Other cardiomyopathies: Secondary | ICD-10-CM | POA: Insufficient documentation

## 2017-09-20 DIAGNOSIS — G8929 Other chronic pain: Secondary | ICD-10-CM | POA: Diagnosis not present

## 2017-09-20 DIAGNOSIS — Z87891 Personal history of nicotine dependence: Secondary | ICD-10-CM | POA: Diagnosis not present

## 2017-09-20 DIAGNOSIS — M109 Gout, unspecified: Secondary | ICD-10-CM | POA: Insufficient documentation

## 2017-09-20 DIAGNOSIS — E1122 Type 2 diabetes mellitus with diabetic chronic kidney disease: Secondary | ICD-10-CM | POA: Diagnosis not present

## 2017-09-20 DIAGNOSIS — E669 Obesity, unspecified: Secondary | ICD-10-CM | POA: Diagnosis not present

## 2017-09-20 DIAGNOSIS — R0602 Shortness of breath: Secondary | ICD-10-CM | POA: Diagnosis not present

## 2017-09-20 DIAGNOSIS — H409 Unspecified glaucoma: Secondary | ICD-10-CM | POA: Diagnosis not present

## 2017-09-20 DIAGNOSIS — E890 Postprocedural hypothyroidism: Secondary | ICD-10-CM | POA: Insufficient documentation

## 2017-09-20 DIAGNOSIS — Z79890 Hormone replacement therapy (postmenopausal): Secondary | ICD-10-CM | POA: Insufficient documentation

## 2017-09-20 DIAGNOSIS — M7989 Other specified soft tissue disorders: Secondary | ICD-10-CM | POA: Diagnosis not present

## 2017-09-20 DIAGNOSIS — Z95810 Presence of automatic (implantable) cardiac defibrillator: Secondary | ICD-10-CM | POA: Insufficient documentation

## 2017-09-20 DIAGNOSIS — Z79899 Other long term (current) drug therapy: Secondary | ICD-10-CM | POA: Diagnosis not present

## 2017-09-20 DIAGNOSIS — Z880 Allergy status to penicillin: Secondary | ICD-10-CM | POA: Insufficient documentation

## 2017-09-20 DIAGNOSIS — Z888 Allergy status to other drugs, medicaments and biological substances status: Secondary | ICD-10-CM | POA: Insufficient documentation

## 2017-09-20 DIAGNOSIS — Z85850 Personal history of malignant neoplasm of thyroid: Secondary | ICD-10-CM | POA: Insufficient documentation

## 2017-09-20 DIAGNOSIS — K219 Gastro-esophageal reflux disease without esophagitis: Secondary | ICD-10-CM | POA: Diagnosis not present

## 2017-09-20 DIAGNOSIS — Z801 Family history of malignant neoplasm of trachea, bronchus and lung: Secondary | ICD-10-CM | POA: Insufficient documentation

## 2017-09-20 LAB — CBC WITH DIFFERENTIAL/PLATELET
Basophils Absolute: 0 10*3/uL (ref 0.0–0.1)
Basophils Relative: 0 %
Eosinophils Absolute: 0 10*3/uL (ref 0.0–0.7)
Eosinophils Relative: 0 %
HCT: 32.1 % — ABNORMAL LOW (ref 36.0–46.0)
Hemoglobin: 10.1 g/dL — ABNORMAL LOW (ref 12.0–15.0)
Lymphocytes Relative: 13 %
Lymphs Abs: 0.8 10*3/uL (ref 0.7–4.0)
MCH: 30.2 pg (ref 26.0–34.0)
MCHC: 31.5 g/dL (ref 30.0–36.0)
MCV: 96.1 fL (ref 78.0–100.0)
Monocytes Absolute: 0.6 10*3/uL (ref 0.1–1.0)
Monocytes Relative: 9 %
Neutro Abs: 5.1 10*3/uL (ref 1.7–7.7)
Neutrophils Relative %: 78 %
Platelets: 172 10*3/uL (ref 150–400)
RBC: 3.34 MIL/uL — ABNORMAL LOW (ref 3.87–5.11)
RDW: 16 % — ABNORMAL HIGH (ref 11.5–15.5)
WBC: 6.6 10*3/uL (ref 4.0–10.5)

## 2017-09-20 LAB — FERRITIN: Ferritin: 795 ng/mL — ABNORMAL HIGH (ref 11–307)

## 2017-09-20 LAB — BASIC METABOLIC PANEL
Anion gap: 11 (ref 5–15)
BUN: 52 mg/dL — ABNORMAL HIGH (ref 6–20)
CO2: 26 mmol/L (ref 22–32)
Calcium: 9.5 mg/dL (ref 8.9–10.3)
Chloride: 101 mmol/L (ref 101–111)
Creatinine, Ser: 1.73 mg/dL — ABNORMAL HIGH (ref 0.44–1.00)
GFR calc Af Amer: 30 mL/min — ABNORMAL LOW (ref >60)
GFR calc non Af Amer: 26 mL/min — ABNORMAL LOW (ref >60)
Glucose, Bld: 99 mg/dL (ref 65–99)
Potassium: 4 mmol/L (ref 3.5–5.1)
Sodium: 138 mmol/L (ref 135–145)

## 2017-09-20 MED ORDER — EPOETIN ALFA 40000 UNIT/ML IJ SOLN
40000.0000 [IU] | Freq: Once | INTRAMUSCULAR | Status: AC
  Administered 2017-09-20: 40000 [IU] via SUBCUTANEOUS

## 2017-09-20 MED ORDER — EPOETIN ALFA 40000 UNIT/ML IJ SOLN
INTRAMUSCULAR | Status: AC
  Filled 2017-09-20: qty 1

## 2017-09-20 NOTE — Patient Instructions (Signed)
[place] at [facility] Discharge Instructions  RECOMMENDATIONS MADE BY THE CONSULTANT AND ANY TEST RESULTS WILL BE SENT TO YOUR REFERRING PHYSICIAN.  Your Hemoglobin today is 10.1. You received your procrit injection. Follow up as scheduled.  Thank you for choosing [place] at [facility] to provide your oncology and hematology care.  To afford each patient quality time with our provider, please arrive at least 15 minutes before your scheduled appointment time.    If you have a lab appointment with the [facility] please come in thru the  Main Entrance and check in at the main information desk  You need to re-schedule your appointment should you arrive 10 or more minutes late.  We strive to give you quality time with our providers, and arriving late affects you and other patients whose appointments are after yours.  Also, if you no show three or more times for appointments you may be dismissed from the clinic at the providers discretion.     Again, thank you for choosing [facility].  Our hope is that these requests will decrease the amount of time that you wait before being seen by our physicians.       _____________________________________________________________  Should you have questions after your visit to [facility], please contact our office at [phone] between the hours of 8:30 a.m. and 4:30 p.m.  Voicemails left after 4:30 p.m. will not be returned until the following business day.  For prescription refill requests, have your pharmacy contact our office.       Resources For Cancer Patients and their Caregivers ? American Cancer Society: Can assist with transportation, wigs, general needs, runs Look Good Feel Better.        346 366 1638 ? Cancer Care: Provides financial assistance, online support groups, medication/co-pay assistance.  1-800-813-HOPE 8121884344) ? [place] Assists [place] cancer patients and their families through emotional , educational and financial support.  (734) 449-0732 ? [place] DSS Where to apply for food stamps, Medicaid and utility assistance. 364 462 4094 ? [employer]: Transportation to medical appointments. 614 888 2117 ? Social Security Administration: May apply for disability if have a Stage IV cancer. (229)566-0710 (910)141-6612 ? [employer], Disability and Transit Services: Assists with nutrition, care and transit needs. [place] Support Programs: @10RELATIVEDAYS @ > Cancer Support Group  2nd Tuesday of the month 1pm-2pm, Journey Room  > Creative Journey  3rd Tuesday of the month 1130am-1pm, Journey Room  > Look Good Feel Better  1st Wednesday of the month 10am-12 noon, Journey Room (Call [place] to register [phone])

## 2017-09-20 NOTE — Progress Notes (Signed)
[name] [name] presents today for injection per MD orders. Procrit 40,000 units administered SQ in left lower abdomen. Administration without incident. Patient tolerated well. Patient discharged in stable condition via wheelchair with family. Patient to follow up as scheduled.

## 2017-09-21 DIAGNOSIS — I428 Other cardiomyopathies: Secondary | ICD-10-CM | POA: Diagnosis not present

## 2017-09-21 DIAGNOSIS — I110 Hypertensive heart disease with heart failure: Secondary | ICD-10-CM | POA: Diagnosis not present

## 2017-09-21 DIAGNOSIS — M48061 Spinal stenosis, lumbar region without neurogenic claudication: Secondary | ICD-10-CM | POA: Diagnosis not present

## 2017-09-21 DIAGNOSIS — K8020 Calculus of gallbladder without cholecystitis without obstruction: Secondary | ICD-10-CM | POA: Diagnosis not present

## 2017-09-21 DIAGNOSIS — M7061 Trochanteric bursitis, right hip: Secondary | ICD-10-CM | POA: Diagnosis not present

## 2017-09-21 DIAGNOSIS — I5022 Chronic systolic (congestive) heart failure: Secondary | ICD-10-CM | POA: Diagnosis not present

## 2017-09-23 ENCOUNTER — Encounter (HOSPITAL_COMMUNITY): Payer: Self-pay | Admitting: Adult Health

## 2017-09-24 IMAGING — CT CT ANGIO CHEST
2 of 6 series · 18 of 46 positions shown · IV contrast (Isovue)
Comparison: None.

CLINICAL DATA: SOB x 2 days, elevated troponin, pacer, chf, reduced
dose due to gfr of 33

EXAM:
CT ANGIOGRAPHY CHEST WITH CONTRAST
TECHNIQUE: Multidetector CT imaging of the chest was performed using the
standard protocol during bolus administration of intravenous
contrast. Multiplanar CT image reconstructions and MIPs were
obtained to evaluate the vascular anatomy.
CONTRAST:  50 mL Isovue 370 IV

[Series 6: thins · axial · 0.78mm/px · z∈[-235,+36]mm · 15 of 297 slices shown]
[im 13/297  lung]
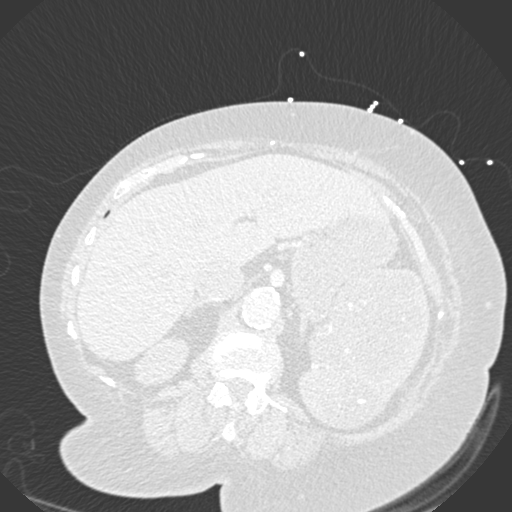
[im 39/297  soft-tissue]
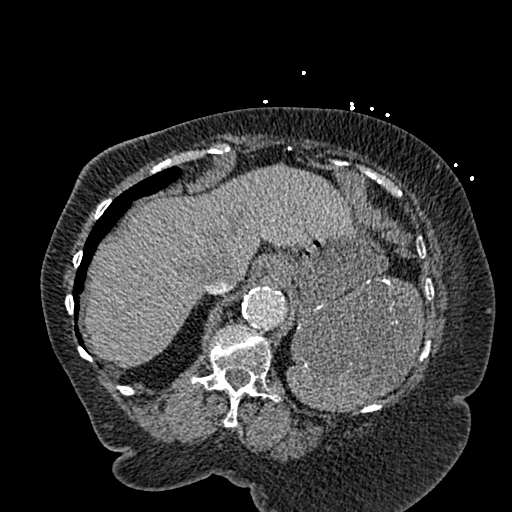
[im 52/297  lung]
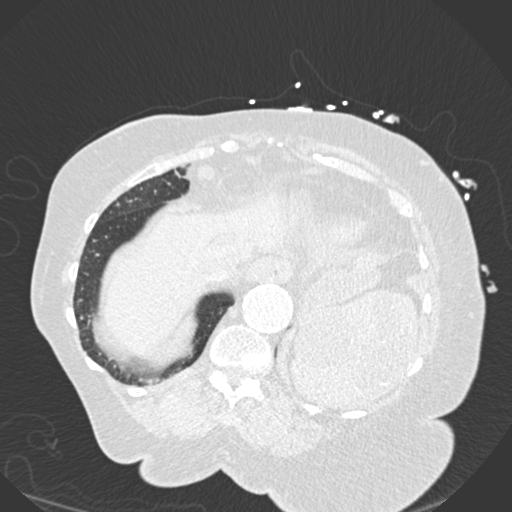
[im 78/297  soft-tissue]
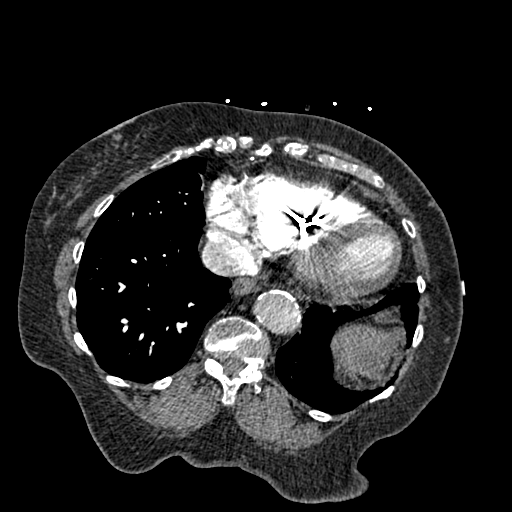
[im 91/297  lung]
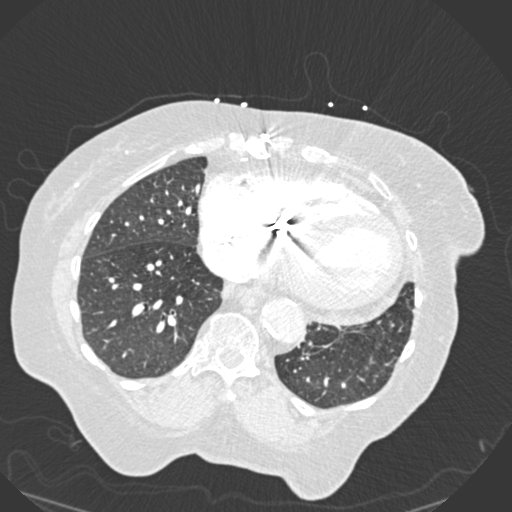
[im 116/297  soft-tissue]
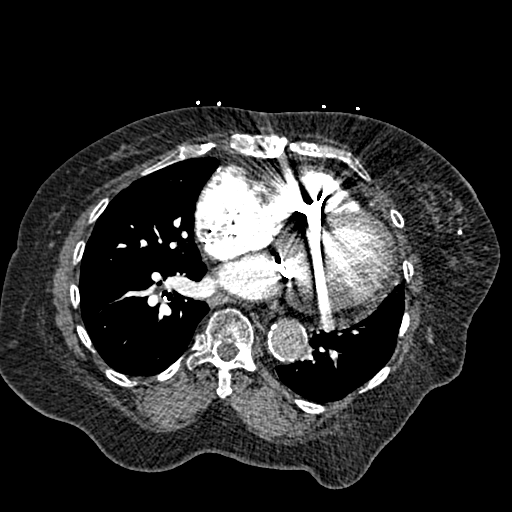
[im 129/297  lung]
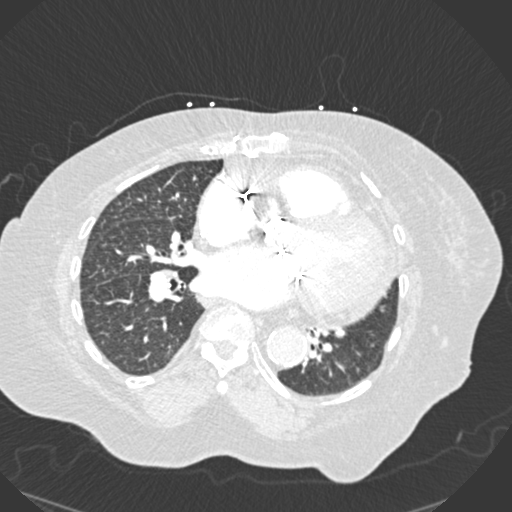
[im 155/297  soft-tissue]
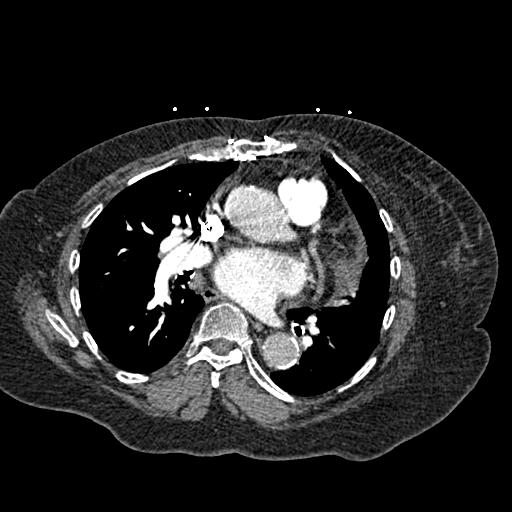
[im 168/297  lung]
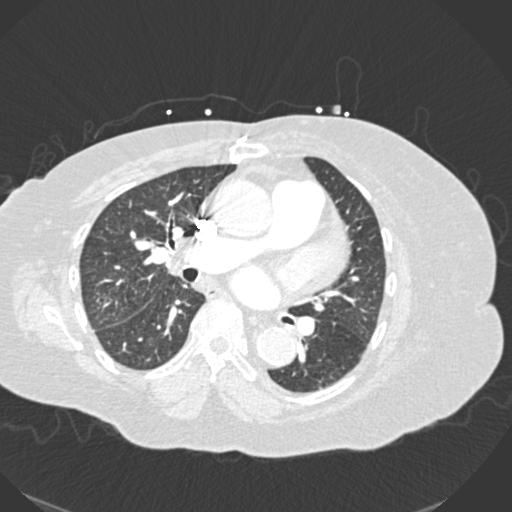
[im 181/297  soft-tissue]
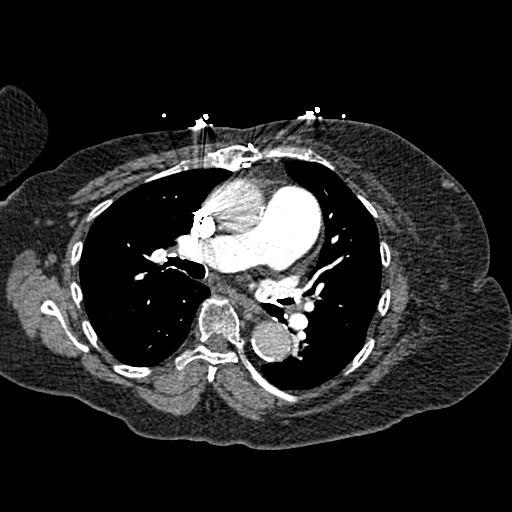
[im 206/297  lung]
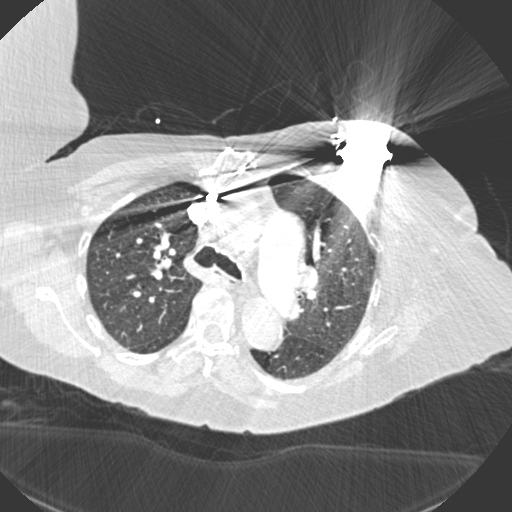
[im 219/297  soft-tissue]
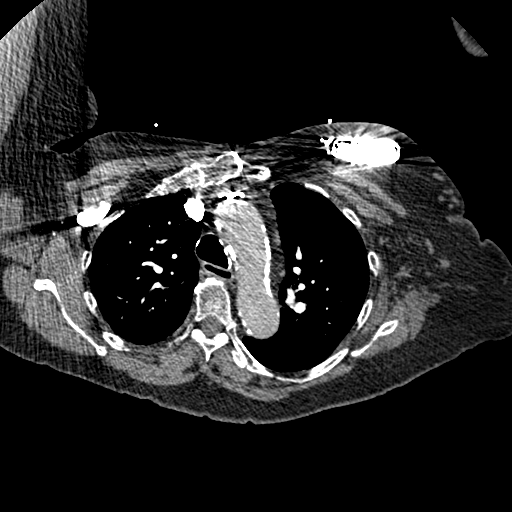
[im 245/297  lung]
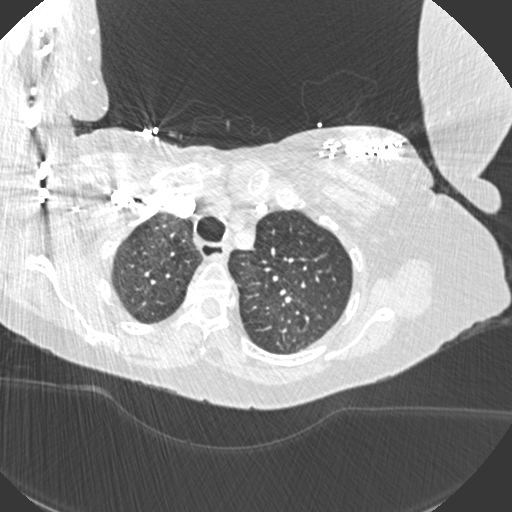
[im 258/297  soft-tissue]
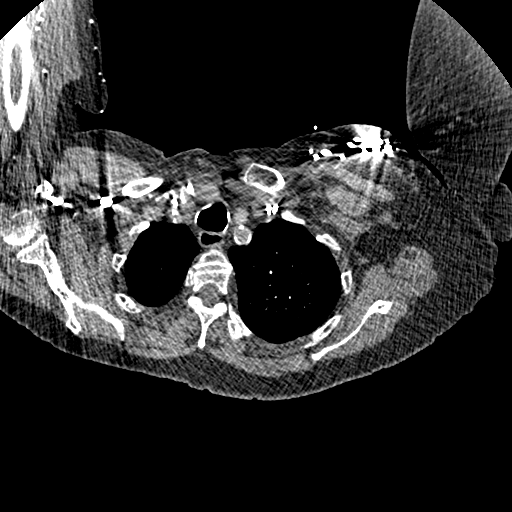
[im 284/297  lung]
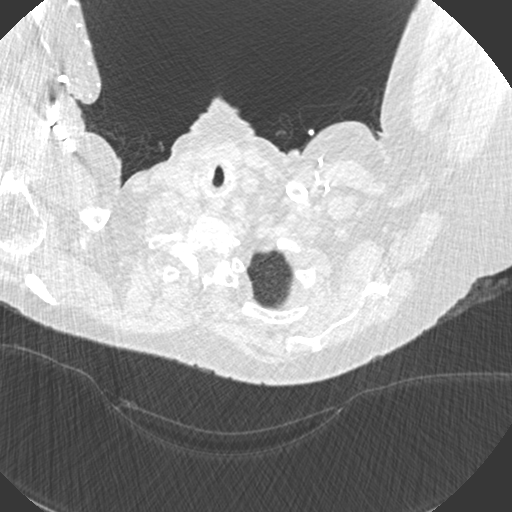

[Series 9: coronal mpr · coronal · 0.58mm/px · 3 of 133 slices shown]
[im 34/133  soft-tissue]
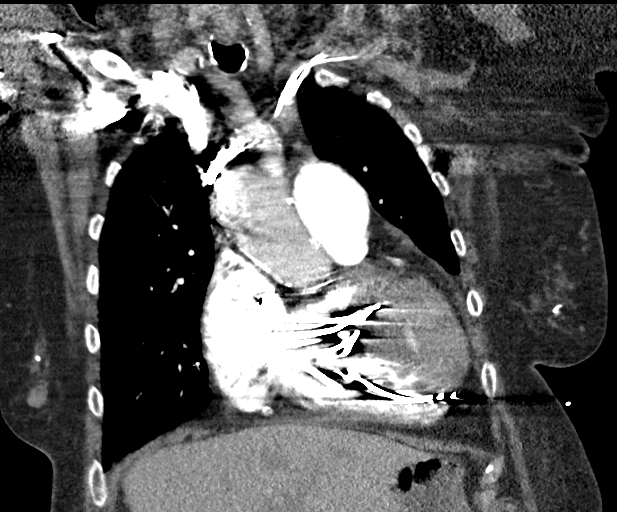
[im 67/133  soft-tissue]
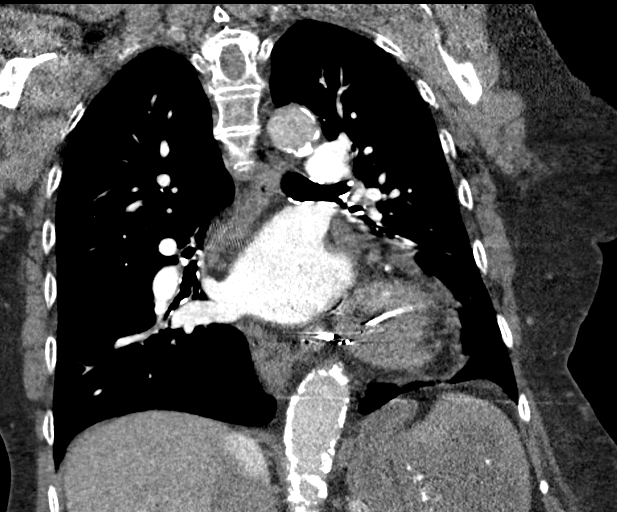
[im 100/133  soft-tissue]
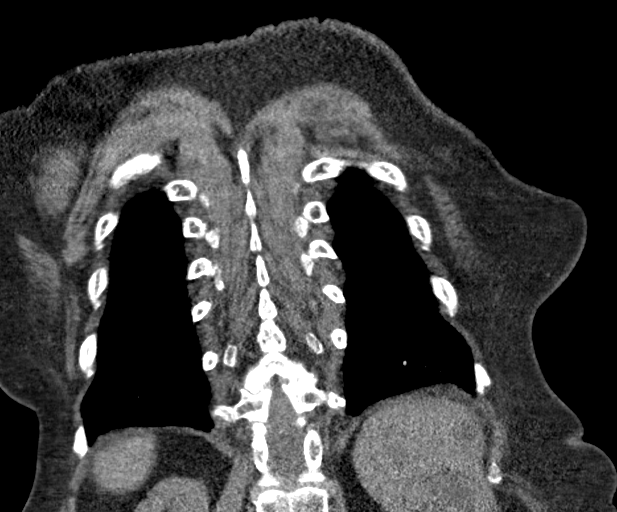

[18 of 46 positions shown; findings below may reference images not displayed]

FINDINGS: Cardiovascular: Right arm IV contrast administration. Left
subclavian transvenous pacing AICD leads extend into the coronary
sinus, right atrium and right ventricle. SVC remains patent. Right
atrial enlargement. Some reflux of contrast from the right atrium
into the IVC. Right ventricle is nondilated. Dilated central
pulmonary arteries.

Satisfactory opacification of pulmonary arteries noted, and there is
no evidence of pulmonary emboli. Patent pulmonary veins drain into
the left atrium. There is mild left atrial enlargement. Previous
mitral valve replacement surgery. Scattered coronary calcifications.
Previous aortic valve replacement surgery.

Adequate contrast opacification of the thoracic aorta with no
evidence of dissection, aneurysm, or stenosis. There is dilated
ascending aorta up to 4.2 cm diameter. Classic 3 vessel
Brachiocephalic arch anatomy without proximal stenosis. Moderate
atheromatous calcifications throughout the thoracic aorta.

Mediastinum/Nodes: No pericardial effusion. No adenopathy localized.

Lungs/Pleura: No pleural effusion. No pneumothorax. Lungs are clear.

Upper Abdomen: Multiple cystic lesions in the enlarged spleen with
associated curvilinear coarse calcifications, as before. No acute
findings.

Musculoskeletal: Mild anterior compression deformity of T7 vertebral
body, visible on previous chest radiographs. Previous median
sternotomy. No acute fracture or worrisome bone lesion.

Review of the MIP images confirms the above findings.
IMPRESSION: 1. Negative for acute PE or thoracic aortic dissection.
2. Extensive coronary and aortic atheromatous calcifications.
3. 4.2 cm ascending aortic aneurysm. Recommend annual imaging
followup by CTA or MRA. This recommendation follows 7656
ACCF/AHA/AATS/ACR/ASA/SCA/RONLOR/[place]/TIGER/MUNDESI PUNESIMI Guidelines for the
Diagnosis and Management of Patients with Thoracic Aortic Disease.
Circulation. 7656; 121: [id number]

## 2017-09-24 IMAGING — DX DG CHEST 2V
2 series · 2 of 2 positions shown · non-contrast
Comparison: Chest radiograph performed 10/09/2016

CLINICAL DATA: Acute onset of shortness of breath. Initial
encounter.

EXAM:
CHEST  2 VIEW

[chest pa]
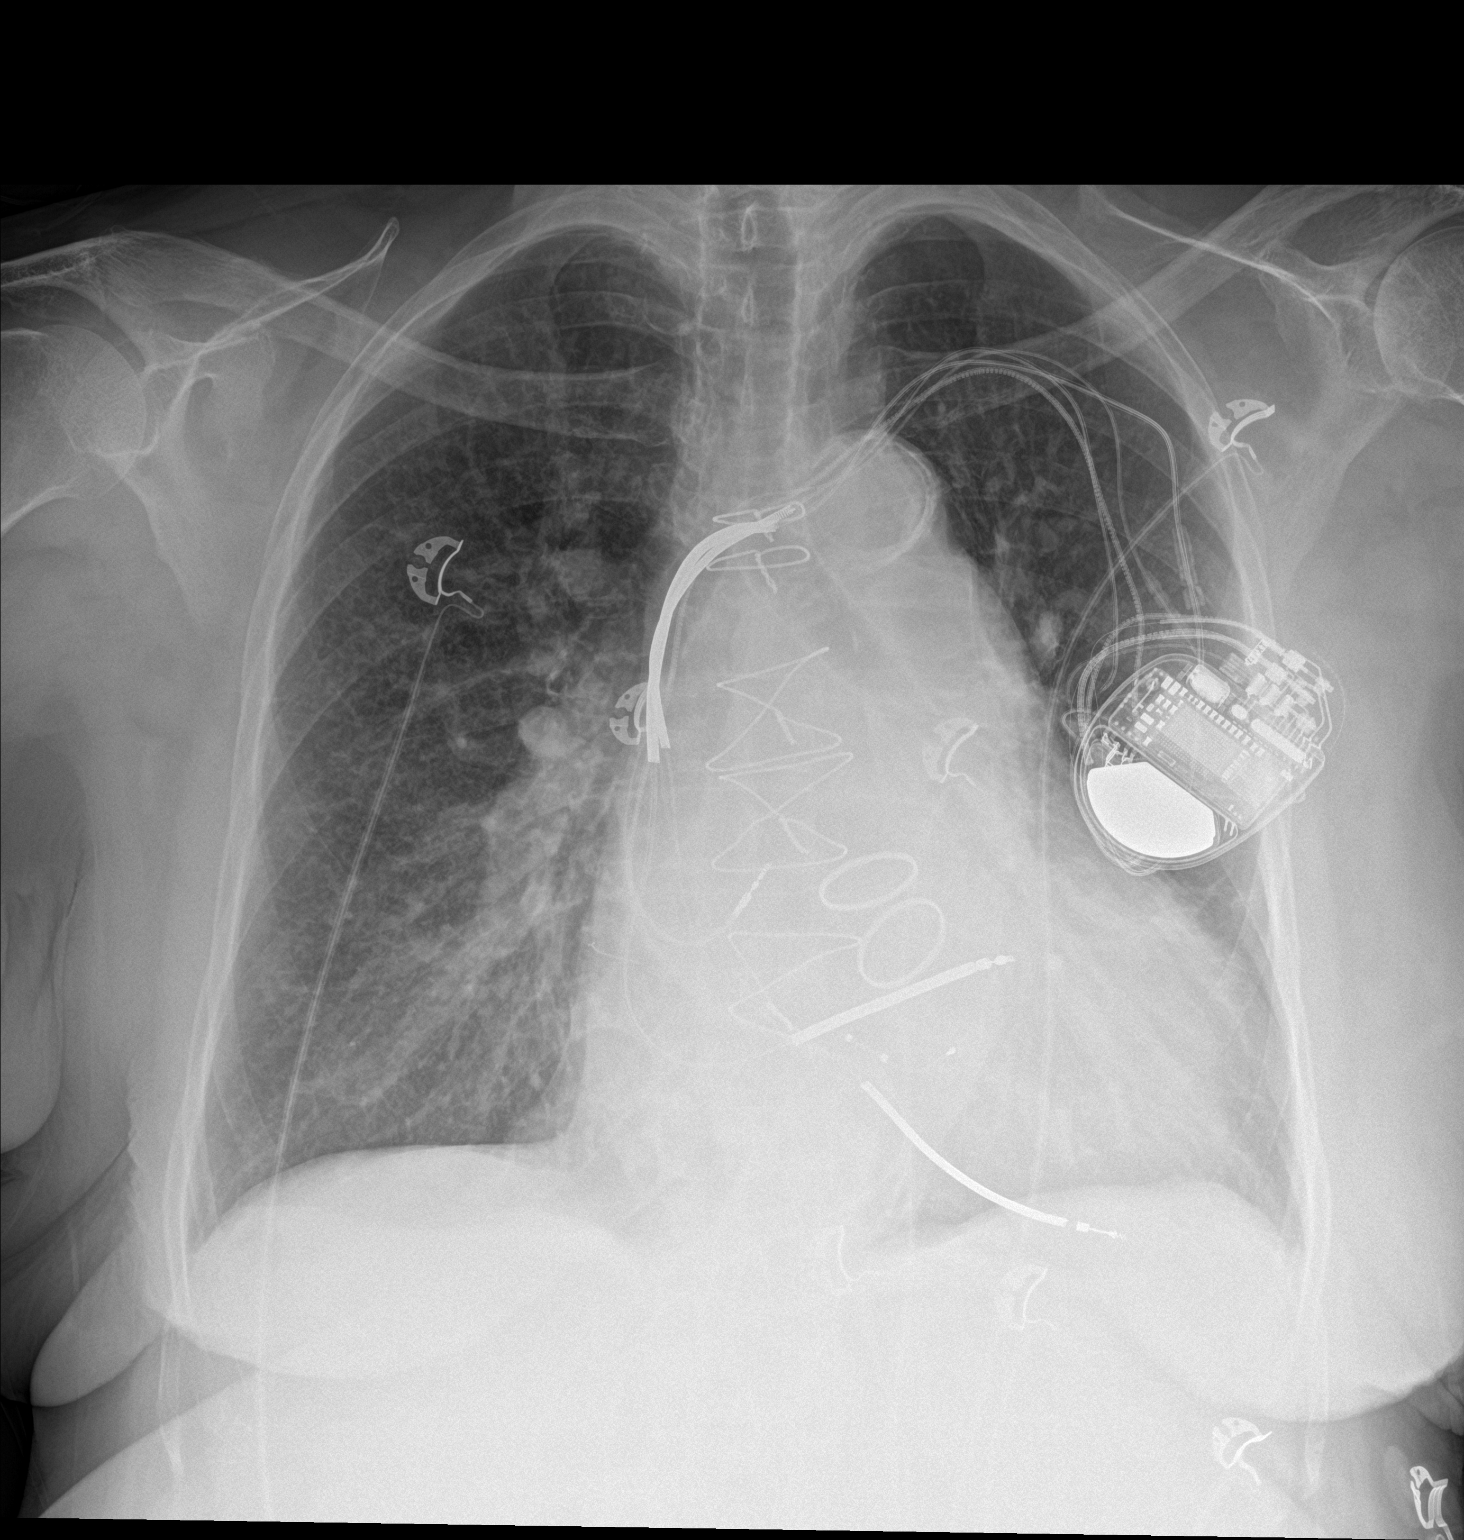

[chest lat]
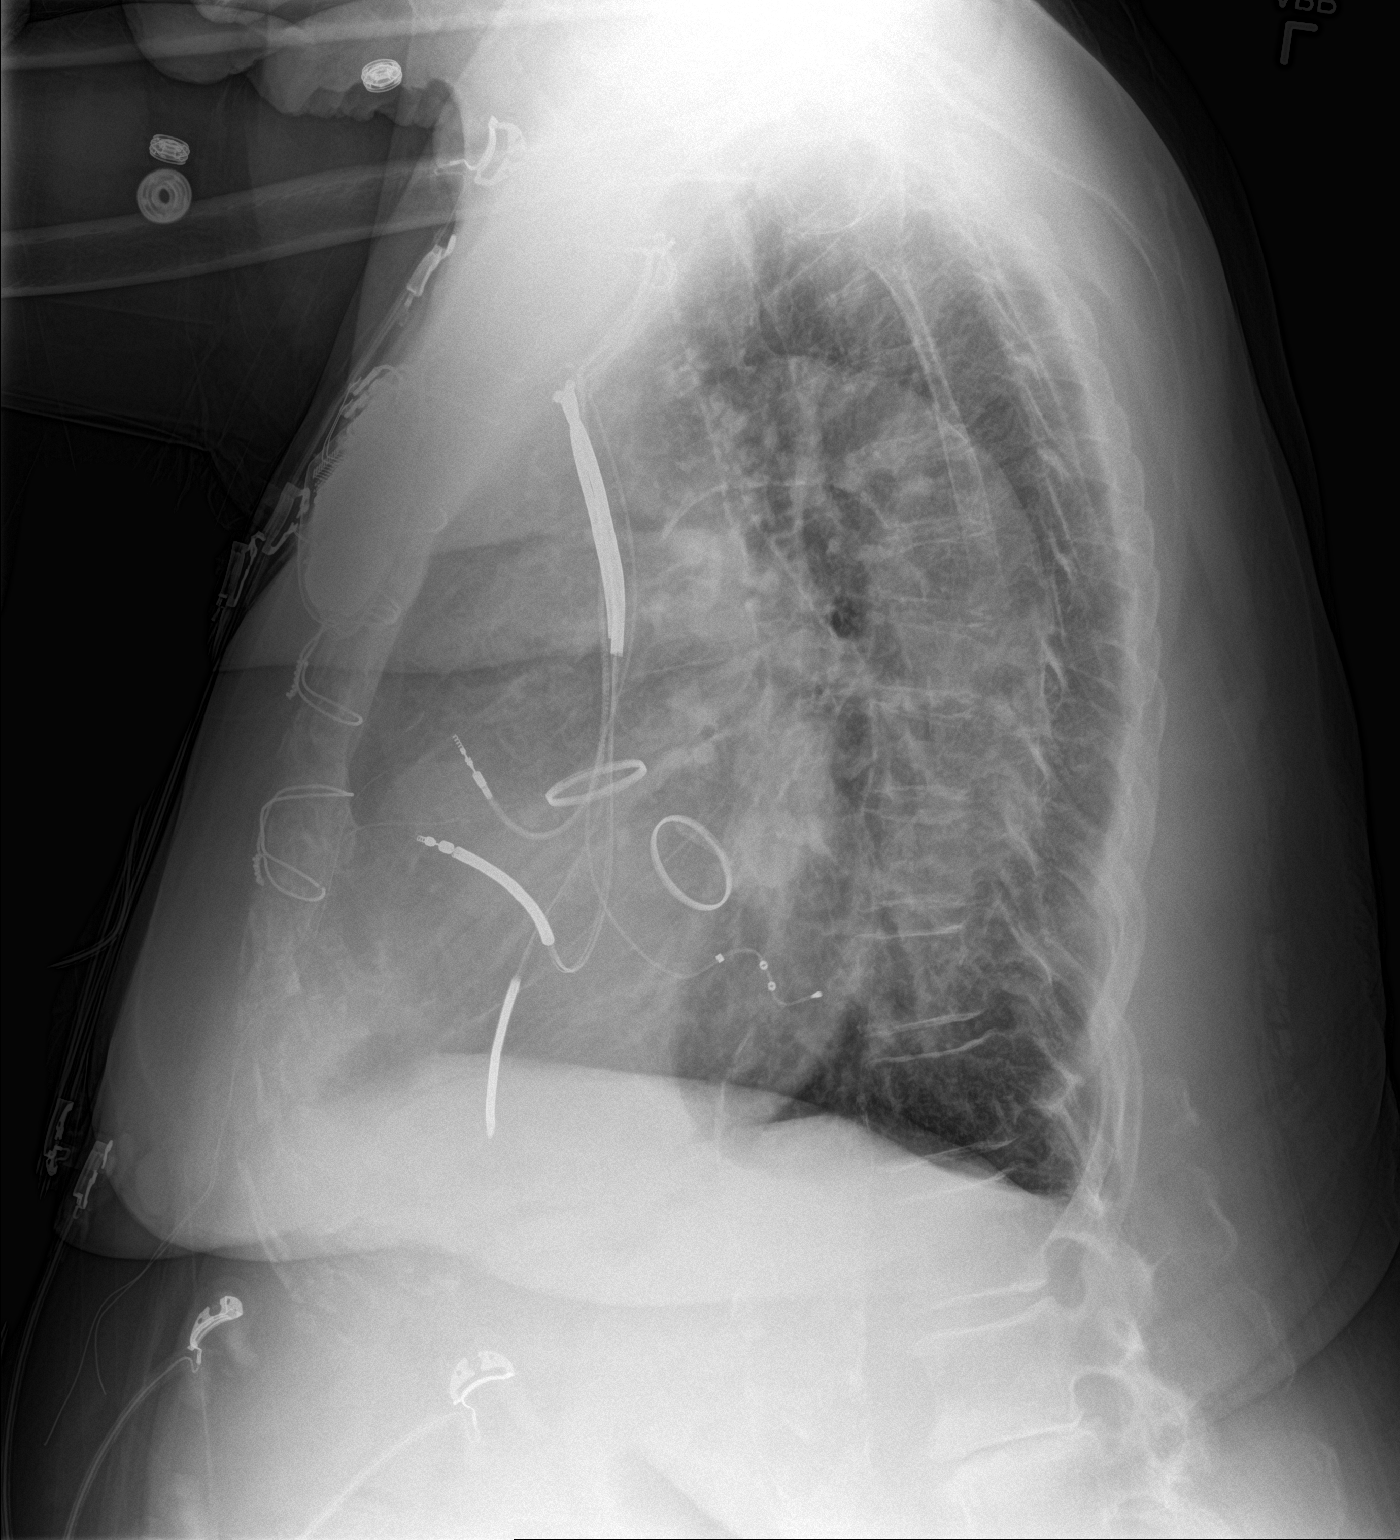

[2 of 2 positions shown; findings below may reference images not displayed]

FINDINGS: The lungs are well-aerated. Vascular congestion is noted. There is
stable nodularity at the right hilum, largely unchanged from 9334.
Mildly increased interstitial markings may reflect minimal
interstitial edema. There is no evidence of pleural effusion or
pneumothorax.

The heart is mildly enlarged. A pacemaker/AICD is noted at the left
chest wall, with leads ending at the right atrium, right ventricle
and coronary sinus. No acute osseous abnormalities are seen. Minimal
chronic anterior wedging is noted at the mid thoracic spine.
IMPRESSION: Vascular congestion and mild cardiomegaly. Mildly increased
interstitial markings may reflect minimal interstitial edema.

## 2017-09-26 ENCOUNTER — Ambulatory Visit (INDEPENDENT_AMBULATORY_CARE_PROVIDER_SITE_OTHER): Payer: Medicare Other | Admitting: *Deleted

## 2017-09-26 DIAGNOSIS — I255 Ischemic cardiomyopathy: Secondary | ICD-10-CM

## 2017-09-27 ENCOUNTER — Other Ambulatory Visit: Payer: Self-pay | Admitting: *Deleted

## 2017-09-27 NOTE — Progress Notes (Signed)
Remote ICD transmission.   

## 2017-09-28 ENCOUNTER — Other Ambulatory Visit: Payer: Self-pay | Admitting: *Deleted

## 2017-09-28 ENCOUNTER — Encounter: Payer: Self-pay | Admitting: Cardiology

## 2017-09-28 NOTE — Patient Outreach (Signed)
[facility]) Care Management  09/28/2017  [name] [name] 1932-05-15 [id number]   Case closure/care coordination  [facility] CM received a call from [name] to inquire about a "pain behind her right knee" She denies a raised area, swelling,recent injury nor warmth to the area.  She report pain but when questioned about the pain level number she states  "better than yesterday" Cm discussed use of warm packs or cold packs if more tolerable, compression hose use, elevation of the extremity [name] then began to discussed getting an appointment to a new [facility] orthopedic MD.  She discussed Dr Luna Glasgow work now only 3 days.  She then informed CM she had already called and spoken with Velna Hatchet at Dr [name] office about her right knee pain before calling CM.  [name] states Velna Hatchet reported she was to be referred to Dr Luna Glasgow Cm reminded [name] of the 24 hr nurse line availability   Plans:: CM discussed case closure with [name] and not meeting health coach criteria Letters for closure to be sent to [name] and Dr [name] [facility] CMA updated Route note to Dr Velia Meyer L. Lavina [name], RN, BSN, [place] Coordinator [phone] week day mobile

## 2017-09-29 LAB — CUP PACEART REMOTE DEVICE CHECK
Battery Remaining Longevity: 13 mo
Battery Voltage: 2.9 V
Brady Statistic AP VP Percent: 73.94 %
Brady Statistic AP VS Percent: 0 %
Brady Statistic AS VP Percent: 25.95 %
Brady Statistic AS VS Percent: 0.11 %
Brady Statistic RA Percent Paced: 67.98 %
Brady Statistic RV Percent Paced: 99.21 %
Date Time Interrogation Session: 20190102051603
HighPow Impedance: 34 Ohm
HighPow Impedance: 41 Ohm
Implantable Lead Implant Date: 20040521
Implantable Lead Implant Date: 20071218
Implantable Lead Implant Date: 20160516
Implantable Lead Location: 753858
Implantable Lead Location: 753859
Implantable Lead Location: 753860
Implantable Lead Model: 5076
Implantable Lead Model: 6947
Implantable Pulse Generator Implant Date: 20160516
Lead Channel Impedance Value: 247 Ohm
Lead Channel Impedance Value: 304 Ohm
Lead Channel Impedance Value: 342 Ohm
Lead Channel Impedance Value: 342 Ohm
Lead Channel Impedance Value: 342 Ohm
Lead Channel Impedance Value: 342 Ohm
Lead Channel Impedance Value: 475 Ohm
Lead Channel Impedance Value: 513 Ohm
Lead Channel Impedance Value: 532 Ohm
Lead Channel Impedance Value: 532 Ohm
Lead Channel Impedance Value: 665 Ohm
Lead Channel Impedance Value: 703 Ohm
Lead Channel Impedance Value: 703 Ohm
Lead Channel Pacing Threshold Amplitude: 0.625 V
Lead Channel Pacing Threshold Amplitude: 1.375 V
Lead Channel Pacing Threshold Amplitude: 5 V
Lead Channel Pacing Threshold Pulse Width: 0.4 ms
Lead Channel Pacing Threshold Pulse Width: 0.4 ms
Lead Channel Pacing Threshold Pulse Width: 1.5 ms
Lead Channel Sensing Intrinsic Amplitude: 1.5 mV
Lead Channel Sensing Intrinsic Amplitude: 1.5 mV
Lead Channel Sensing Intrinsic Amplitude: 10.5 mV
Lead Channel Sensing Intrinsic Amplitude: 10.5 mV
Lead Channel Setting Pacing Amplitude: 2 V
Lead Channel Setting Pacing Amplitude: 2.5 V
Lead Channel Setting Pacing Amplitude: 2.5 V
Lead Channel Setting Pacing Pulse Width: 0.6 ms
Lead Channel Setting Pacing Pulse Width: 1 ms
Lead Channel Setting Sensing Sensitivity: 0.3 mV

## 2017-09-30 DIAGNOSIS — M7061 Trochanteric bursitis, right hip: Secondary | ICD-10-CM | POA: Diagnosis not present

## 2017-09-30 DIAGNOSIS — M25551 Pain in right hip: Secondary | ICD-10-CM | POA: Diagnosis not present

## 2017-10-02 ENCOUNTER — Encounter: Payer: Self-pay | Admitting: Cardiovascular Disease

## 2017-10-02 ENCOUNTER — Ambulatory Visit (INDEPENDENT_AMBULATORY_CARE_PROVIDER_SITE_OTHER): Payer: Medicare Other | Admitting: Cardiovascular Disease

## 2017-10-02 VITALS — BP 136/62 | HR 83 | Ht 67.0 in | Wt 221.0 lb

## 2017-10-02 DIAGNOSIS — D649 Anemia, unspecified: Secondary | ICD-10-CM

## 2017-10-02 DIAGNOSIS — I10 Essential (primary) hypertension: Secondary | ICD-10-CM | POA: Diagnosis not present

## 2017-10-02 DIAGNOSIS — Z95810 Presence of automatic (implantable) cardiac defibrillator: Secondary | ICD-10-CM

## 2017-10-02 DIAGNOSIS — I481 Persistent atrial fibrillation: Secondary | ICD-10-CM

## 2017-10-02 DIAGNOSIS — Z952 Presence of prosthetic heart valve: Secondary | ICD-10-CM | POA: Diagnosis not present

## 2017-10-02 DIAGNOSIS — M25561 Pain in right knee: Secondary | ICD-10-CM

## 2017-10-02 DIAGNOSIS — I428 Other cardiomyopathies: Secondary | ICD-10-CM | POA: Diagnosis not present

## 2017-10-02 DIAGNOSIS — I5022 Chronic systolic (congestive) heart failure: Secondary | ICD-10-CM | POA: Diagnosis not present

## 2017-10-02 DIAGNOSIS — I4819 Other persistent atrial fibrillation: Secondary | ICD-10-CM

## 2017-10-02 DIAGNOSIS — M48061 Spinal stenosis, lumbar region without neurogenic claudication: Secondary | ICD-10-CM

## 2017-10-02 DIAGNOSIS — N184 Chronic kidney disease, stage 4 (severe): Secondary | ICD-10-CM | POA: Diagnosis not present

## 2017-10-02 DIAGNOSIS — G8929 Other chronic pain: Secondary | ICD-10-CM

## 2017-10-02 DIAGNOSIS — J410 Simple chronic bronchitis: Secondary | ICD-10-CM | POA: Diagnosis not present

## 2017-10-02 DIAGNOSIS — I442 Atrioventricular block, complete: Secondary | ICD-10-CM

## 2017-10-02 DIAGNOSIS — I255 Ischemic cardiomyopathy: Secondary | ICD-10-CM

## 2017-10-02 NOTE — Patient Instructions (Signed)
Dr Sallyanne Kuster recommends that you continue on your current medications as directed. Please refer to the Current Medication list given to you today.  Remote monitoring is used to monitor your Pacemaker of ICD from home. This monitoring reduces the number of office visits required to check your device to one time per year. It allows [place] to keep an eye on the functioning of your device to ensure it is working properly. You are scheduled for a device check from home on Wednesday, April 3rd, 2019. You may send your transmission at any time that day. If you have a wireless device, the transmission will be sent automatically. After your physician reviews your transmission, you will receive a postcard with your next transmission date.  Dr Sallyanne Kuster recommends that you schedule a follow-up appointment in 6 months with an ICD check. You will receive a reminder letter in the mail two months in advance. If you don't receive a letter, please call our office to schedule the follow-up appointment.  If you need a refill on your cardiac medications before your next appointment, please call your pharmacy.

## 2017-10-02 NOTE — Progress Notes (Signed)
Cardiology Office Note    Date:  10/02/2017   ID:  [name] [name], DOB [birth year], MRN [id number]  PCP:  Fayrene Helper, MD  Cardiologist:  Cristopher Peru, M.D. (EP/ICD); Sanda Klein, MD   Chief Complaint  Patient presents with  . Follow-up    History of Present Illness:  [name] [name] is a 84 y.o. female returning in follow-up for valvular heart disease, persistent atrial fibrillation complete heart block and chronic combined systolic and diastolic heart failure.  She is quite sedentary due to right knee and right hip pain.   The patient specifically denies any chest pain at rest exertion, dyspnea at rest or with exertion, orthopnea, paroxysmal nocturnal dyspnea, syncope, palpitations, focal neurological deficits, intermittent claudication, lower extremity edema, unexplained weight gain, cough, hemoptysis or wheezing. Has not had falls or bleeding problems. [facility].  She has been in atrial fibrillation for lengthy periods of time in the last year. She had DCCV in February, went back in atrial fibrillation in June,  but has been in normal (atrial sensed- sinus rhythm, ventricular paced) rhythm over the last month. She was seen in the ED for palpitations on Dec 7 (AFib, Vpaced at that time), converted to normal rhythm in the next 48 hours. Optivol suggested a little fluid overload at the time of ED eval, also quickly resolved after that.  Her device is functioning normally and there has ben no recent ventricular arrhythmia. 100% BiV paced. Estimated generator longevity 12 months.  She is pacemaker dependent due to complete heart block following aortic and mitral valve replacement with mechanical prostheses. (AVR 2001, MVR 2004, both [employer] prostheses, surgeries performed in [place]). She has severe cardiomyopathy with an estimated left ventricular ejection fraction of 25%, unchanged since 2004. She did not have any coronary artery disease by angiography  before her first operation. In May 2016 her initial pacemaker lead was extracted and she received a CRT-D Medtronic Viva XT device by Dr. Cristopher Peru.  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  Her most recent hemoglobin on 03/30/2016 was 10.1 with normocytic normochromic indices. Her ferritin is quite high, not consistent with iron deficiency. She has chronic kidney disease and her most recent creatinine was 1.73 on 09/20/2017, with an estimated GFR of around 25 mL/minute. She has diabetes mellitus and hyperlipidemia and hyperuricemia. Hemoglobin A1c in October was 5.9%.   Past Medical History:  Diagnosis Date  . Adenomatous polyp of colon 12/16/2002   Dr. [name]/[facility]  . Allergy   . Cataract   . CHB (complete heart block) ([place]) 10/07/2014      . CHF (congestive heart failure) ([place])       . Chronic kidney disease, stage I 2006  . Constipation       . COPD (chronic obstructive pulmonary disease) ([place])       . DJD (degenerative joint disease) of lumbar spine   . DM (diabetes mellitus) ([place]) 2006  . Esophageal reflux   . Glaucoma   . Gout       . Heart murmur   . Hyperlipemia   . IDA (iron deficiency anemia)    Parenteral iron/[name]  . Insomnia       . Non-ischemic cardiomyopathy ([place])    a. s/p MDT CRTD  . Obesity, unspecified   .  Other and unspecified hyperlipidemia   . Oxygen deficiency 2014   nocturnal;  . Spondylolisthesis   . Spondylosis   . Thyroid cancer ([place])    remote thyroidectomy, no recurrence, pt denies in 2016 thyroid cancer  . Unspecified hypothyroidism       . 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 Vertigo         Past Surgical History:  Procedure Laterality Date  . AORTIC AND MITRAL VALVE REPLACEMENT  2001  .  BI-VENTRICULAR IMPLANTABLE CARDIOVERTER DEFIBRILLATOR UPGRADE N/A 01/18/2015   Procedure: BI-VENTRICULAR IMPLANTABLE CARDIOVERTER DEFIBRILLATOR UPGRADE;  Surgeon: Evans Lance, MD;  Location: [facility] CATH LAB;  Service: Cardiovascular;  Laterality: N/A;  . CARDIAC CATHETERIZATION    . CARDIAC VALVE REPLACEMENT    . CARDIOVERSION N/A 11/13/2016   Procedure: CARDIOVERSION;  Surgeon: Sanda Klein, MD;  Location: [facility] ENDOSCOPY;  Service: Cardiovascular;  Laterality: N/A;  . COLONOSCOPY  06/12/2007   Rourk- Normal rectum/Normal colon  . COLONOSCOPY  12/16/02   small hemorrhoids  . COLONOSCOPY  06/20/2012   Procedure: COLONOSCOPY;  Surgeon: Daneil Dolin, MD;  Location: [facility] ENDO SUITE;  Service: Endoscopy;  Laterality: N/A;  10:45  . defibrillator implanted 2006    . DOPPLER ECHOCARDIOGRAPHY  2012  . DOPPLER ECHOCARDIOGRAPHY  05,06,07,08,09,2011  . ESOPHAGOGASTRODUODENOSCOPY  06/12/2007   Rourk- normal esophagus, small hiatal hernia, otherwise normal stomach, D1, D2  . EYE SURGERY Right 2005  . EYE SURGERY Left 2006  . ICD LEAD REMOVAL Left 02/08/2015   Procedure: ICD LEAD REMOVAL;  Surgeon: Evans Lance, MD;  Location: [facility] OR;  Service: Cardiovascular;  Laterality: Left;  "Will plan extraction and insertion of a BiV PM"  **Dr. Roxan Hockey backing up case**  . IMPLANTABLE CARDIOVERTER DEFIBRILLATOR (ICD) GENERATOR CHANGE Left 02/08/2015   Procedure: ICD GENERATOR CHANGE;  Surgeon: Evans Lance, MD;  Location: [facility];  Service: Cardiovascular;  Laterality: Left;  . INSERT / REPLACE / REMOVE PACEMAKER    . PACEMAKER INSERTION  May 2016  . pacemaker placed  2004  . right breast cyst removed     benign   . right cataract removed     2005  . TEE WITHOUT CARDIOVERSION N/A 11/13/2016   Procedure: TRANSESOPHAGEAL ECHOCARDIOGRAM (TEE);  Surgeon: Sanda Klein, MD;  Location[facility] ENDOSCOPY;  Service: Cardiovascular;  Laterality: N/A;  . THYROIDECTOMY      Current Medications: Outpatient Medications  Prior to Visit  Medication Sig Dispense Refill  . acetaminophen (TYLENOL) 500 MG tablet Take 500 mg by mouth every 6 (six) hours as needed (pain).    Marland Kitchen albuterol (PROVENTIL) (2.5 MG/3ML) 0.083% nebulizer solution INHALE 1 VIAL VIA NEBULIZER THREE TIMES DAILY. (Patient taking differently: INHALE 1 VIAL VIA NEBULIZER THREE TIMES DAILY AS NEEDED) 90 vial 5  . albuterol (VENTOLIN HFA) 108 (90 Base) MCG/ACT inhaler Inhale 2 puffs into the lungs every 6 (six) hours as needed for wheezing or shortness of breath. 3 Inhaler 3  . allopurinol (ZYLOPRIM) 100 MG tablet Take 1 tablet (100 mg total) by mouth daily. 90 tablet 1  . azelastine (ASTELIN) 0.1 % nasal spray Place 2 sprays into both nostrils 2 (two) times daily. Use in each nostril as directed (Patient taking differently: Place 2 sprays into both nostrils daily as needed for allergies. Use in each nostril as directed) 30 mL 12  . calcitRIOL (ROCALTROL) 0.25 MCG capsule Take 0.25 mcg by mouth daily.   0  . calcium carbonate (OS-CAL) 1250 (500  Ca) MG chewable tablet Chew 1 tablet by mouth daily.    . cholecalciferol (VITAMIN D) 1000 UNITS tablet Take 1,000 Units by mouth daily.    Marland Kitchen COUMADIN 5 MG tablet Take 2 tablets daily except 1 tablet on Tuesdays (Patient taking differently: Take 1 tablet Tuesday, Thursday, and Saturday and  Take 2 tablets all other days.) 60 tablet 6  . digoxin (LANOXIN) 0.125 MG tablet Take 1 tablet (0.125 mg total) by mouth every other day. 45 tablet 3  . docusate sodium (COLACE) 100 MG capsule Take 300 mg by mouth at bedtime.    . fluticasone (FLONASE) 50 MCG/ACT nasal spray USE 2 SPRAYS IN EACH       NOSTRIL ONCE DAILY (Patient taking differently: USE 2 SPRAYS IN EACH       NOSTRIL ONCE DAILY AS NEEDED FOR CONGESTION) 48 g 1  . folic acid (FOLVITE) 1 MG tablet Take 1 tablet (1 mg total) by mouth daily. 100 tablet 3  . furosemide (LASIX) 40 MG tablet Take 1 tablet by mouth  daily 90 tablet 1  . gabapentin (NEURONTIN) 300 MG capsule  Take 1 capsule (300 mg total) by mouth at bedtime. 30 capsule 3  . hydrALAZINE (APRESOLINE) 25 MG tablet Take 1 tablet (25 mg total) by mouth 3 (three) times daily. 270 tablet 3  . HYDROcodone-acetaminophen (NORCO) 7.5-325 MG tablet Take 1 tablet by mouth every 6 (six) hours as needed for moderate pain (Must last 30 days.Do not drive or operate machinery while taking this medicine.). 70 tablet 0  . isosorbide mononitrate (IMDUR) 60 MG 24 hr tablet Take 1 tablet (60 mg total) by mouth daily. 90 tablet 1  . losartan (COZAAR) 50 MG tablet take 1 tablet by mouth once daily 90 tablet 1  . meclizine (ANTIVERT) 25 MG tablet Take 1 tablet (25 mg total) by mouth 3 (three) times daily as needed for dizziness. 30 tablet 0  . metolazone (ZAROXOLYN) 2.5 MG tablet TAKE 1 TABLET ONCE A WEEK 12 tablet 2  . ondansetron (ZOFRAN) 4 MG tablet Take 1 tablet (4 mg total) by mouth daily as needed for nausea or vomiting. 90 tablet 0  . Polyethyl [name] (SYSTANE OP) Place 1 drop into both eyes daily as needed (dry eyes).    . potassium chloride SA (KLOR-CON M20) 20 MEQ tablet Take 1 tablet by mouth  daily 90 tablet 1  . pravastatin (PRAVACHOL) 40 MG tablet Take 1 tablet (40 mg total) by mouth every evening. 90 tablet 3  . promethazine-dextromethorphan (PROMETHAZINE-DM) 6.25-15 MG/5ML syrup One teaspoon at bedtime, as needed, for excessive cough (Patient taking differently: Take 5 mLs by mouth at bedtime as needed for cough. as needed, for excessive cough) 180 mL 0  . SYNTHROID 75 MCG tablet Take 1 tablet by mouth  daily 90 tablet 1  . TOPROL XL 100 MG 24 hr tablet Take 1 tablet (100 mg total) by mouth daily. Take with or immediately following a meal. 90 tablet 1  . UNABLE TO FIND Walker with seat x 1 1 each 0   Facility-Administered Medications Prior to Visit  Medication Dose Route Frequency Provider Last Rate Last Dose  . epoetin alfa (EPOGEN,PROCRIT) injection 40,000 Units  40,000 Units Subcutaneous Once  Farrel Gobble, MD         Allergies:   Penicillins; Aspirin; Fentanyl; and Niacin   Social History   Socioeconomic History  . Marital status: Widowed    Spouse name: None  . Number of children:  0  . Years of education: None  . Highest education level: None  Social Needs  . Financial resource strain: None  . Food insecurity - worry: None  . Food insecurity - inability: None  . Transportation needs - medical: None  . Transportation needs - non-medical: None  Occupational History  . Occupation: retired    Fish farm manager: RETIRED  Tobacco Use  . Smoking status: Former Smoker    Packs/day: 0.75    Years: 40.00    Pack years: 30.00    Types: Cigarettes    Last attempt to quit: 03/08/1987    Years since quitting: 30.5  . Smokeless tobacco: Never Used  Substance and Sexual Activity  . Alcohol use: No    Alcohol/week: 0.0 oz  . Drug use: No  . Sexual activity: Not Currently  Other Topics Concern  . None  Social History Narrative   From [place] (2004)     Family History:  The patient's family history includes Diabetes in her brother and brother; Heart attack in her sister; Heart disease in her brother, brother, father, and sister; Hypertension in her brother; Lung cancer in her brother; Pancreatic cancer in her mother.   ROS:   Please see the history of present illness.    ROS All other systems reviewed and are negative.   PHYSICAL EXAM:   VS:  BP 136/62   Pulse 83   Ht 5\' 7"  (1.702 m)   Wt 221 lb (100.2 kg)   BMI 34.61 kg/m     General: Alert, oriented x3, no distress, obese Head: no evidence of trauma, PERRL, EOMI, no exophtalmos or lid lag, no myxedema, no xanthelasma; normal ears, nose and oropharynx Neck: normal jugular venous pulsations and no hepatojugular reflux; brisk carotid pulses without delay and no carotid bruits Chest: clear to auscultation, no signs of consolidation by percussion or palpation, normal fremitus, symmetrical and full respiratory  excursions. Healthy ICD site Cardiovascular: normal position and quality of the apical impulse, regular rhythm, crisp prosthetic clicks, 1/6 early peaking Ao ejection murmur, no diastolic murmur, rubs or gallops Abdomen: no tenderness or distention, no masses by palpation, no abnormal pulsatility or arterial bruits, normal bowel sounds, no hepatosplenomegaly Extremities: no clubbing, cyanosis or edema; 2+ radial, ulnar and brachial pulses bilaterally; 2+ right femoral, posterior tibial and dorsalis pedis pulses; 2+ left femoral, posterior tibial and dorsalis pedis pulses; no subclavian or femoral bruits Neurological: grossly nonfocal Psych: Normal mood and affect   Wt Readings from Last 3 Encounters:  10/02/17 221 lb (100.2 kg)  09/20/17 217 lb (98.4 kg)  08/31/17 220 lb (99.8 kg)      Studies/Labs Reviewed:   EKG:  EKG is not ordered today.  The intracardiac electrogram today demonstrates A sensed, biV paced rhythm  Recent Labs: 04/25/2017: ALT 15 06/28/2017: TSH 1.00 08/31/2017: B Natriuretic Peptide 357.0 09/20/2017: BUN 52; Creatinine, Ser 1.73; Hemoglobin 10.1; Platelets 172; Potassium 4.0; Sodium 138   Lipid Panel    Component Value Date/Time   CHOL 193 06/28/2017 1008   CHOL 165 10/16/2016 1213   TRIG 254 (H) 06/28/2017 1008   HDL 38 (L) 06/28/2017 1008   HDL 31 (L) 10/16/2016 1213   CHOLHDL 5.1 (H) 06/28/2017 1008   VLDL 52 (H) 02/21/2017 1010   LDLCALC 96 02/21/2017 1010   LDLCALC 81 10/16/2016 1213     ASSESSMENT:    1. Chronic systolic heart failure (HCC)   2. Persistent atrial fibrillation ([place])   3. CHB (complete heart block) (HCC)  4. AICD (automatic cardioverter/defibrillator) present   5. S/P MVR (mitral valve replacement)   6. S/P AVR (aortic valve replacement)   7. Anemia, unspecified type   8. Essential hypertension   9. CKD (chronic kidney disease), stage IV ([place])   10. Simple chronic bronchitis ([place])   11. Morbid obesity ([place])   12. Chronic pain  of right knee   13. Spinal stenosis of lumbar region, unspecified whether neurogenic claudication present      PLAN:  In order of problems listed above:  1. CHF: She appears well compensated, euvolemic/NYHA functional class II. Most recent EF 30-35%. On beta blocker, hydralazine/nitrates and ARB, as well as diuretics. 2. AFib: surprisingly spontaneously back in normal rhythm, maintained now for > 1 month, despite about 5 months of persistent AFib June-December 2018. Fully anticoagulated with warfarin. RVR not an issue due to CHB. No clear evidence that this was associated with worsened symptoms or hemodynamics. Would therefore not pursue antiarrhythmic strategy. 3. CHB: Pacemaker dependent. 4. CRT-D: normal device function. 5. Mechanical mitral valve prosthesis 6. Mechanical aortic valve prosthesis - both with normal function by last echo. 7. 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 8. HTN: Well controlled 9. CKD: Stable GFR 10. COPD/asthma: Seems to be well compensated  11. Obesity: Weight loss recommended 12. DJD: symptoms related to R hip and knee (maybe also radiating from lumbar spine?).    Medication Adjustments/Labs and Tests Ordered: Current medicines are reviewed at length with the patient today.  Concerns regarding medicines are outlined above.  Medication changes, Labs and Tests ordered today are listed in the Patient Instructions below. Patient Instructions  Dr Sallyanne Kuster recommends that you continue on your current medications as directed. Please refer to the Current Medication list given to you today.  Remote monitoring is used to monitor your Pacemaker of ICD from home. This monitoring reduces the number of office visits required to check your device to one time per year. It allows [place] to keep an eye on the functioning of your device to ensure it is working properly. You are scheduled for a device check  from home on Wednesday, April 3rd, 2019. You may send your transmission at any time that day. If you have a wireless device, the transmission will be sent automatically. After your physician reviews your transmission, you will receive a postcard with your next transmission date.  Dr Sallyanne Kuster recommends that you schedule a follow-up appointment in 6 months with an ICD check. You will receive a reminder letter in the mail two months in advance. If you don't receive a letter, please call our office to schedule the follow-up appointment.  If you need a refill on your cardiac medications before your next appointment, please call your pharmacy.    Signed, Sanda Klein, MD  10/02/2017 7:48 PM    [facility] [address], [place], [place]  [address] Phone: [phone]; Fax: [phone]

## 2017-10-04 ENCOUNTER — Encounter: Payer: Self-pay | Admitting: Orthopaedic Surgery

## 2017-10-04 ENCOUNTER — Ambulatory Visit (INDEPENDENT_AMBULATORY_CARE_PROVIDER_SITE_OTHER): Payer: Medicare Other | Admitting: Orthopaedic Surgery

## 2017-10-04 DIAGNOSIS — G8929 Other chronic pain: Secondary | ICD-10-CM | POA: Diagnosis not present

## 2017-10-04 DIAGNOSIS — M25561 Pain in right knee: Secondary | ICD-10-CM

## 2017-10-04 NOTE — Progress Notes (Signed)
CC:  I have pain of my right knee. I would like an injection.  The patient has chronic pain of the right knee.  There is no recent trauma.  There is no redness.  Injections in the past have helped.  The knee has no redness, has an effusion and crepitus present.  ROM of the right knee is 0-105.  Impression:  Chronic knee pain right  Return: PRN  PROCEDURE NOTE:  The patient requests injections of the right knee , verbal consent was obtained.  The right knee was prepped appropriately after time out was performed.   Sterile technique was observed and injection of 1 cc of Depo-Medrol 40 mg with several cc's of plain xylocaine. Anesthesia was provided by ethyl chloride and a 20-gauge needle was used to inject the knee area. The injection was tolerated well.  A band aid dressing was applied.  The patient was advised to apply ice later today and tomorrow to the injection sight as needed.  Electronically Signed Sanjuana Kava, MD 1/10/20192:14 PM

## 2017-10-08 ENCOUNTER — Ambulatory Visit (INDEPENDENT_AMBULATORY_CARE_PROVIDER_SITE_OTHER): Payer: Medicare Other | Admitting: *Deleted

## 2017-10-08 ENCOUNTER — Telehealth: Payer: Self-pay | Admitting: Family Medicine

## 2017-10-08 ENCOUNTER — Other Ambulatory Visit: Payer: Self-pay | Admitting: Family Medicine

## 2017-10-08 DIAGNOSIS — Z952 Presence of prosthetic heart valve: Secondary | ICD-10-CM

## 2017-10-08 DIAGNOSIS — I4819 Other persistent atrial fibrillation: Secondary | ICD-10-CM

## 2017-10-08 DIAGNOSIS — I481 Persistent atrial fibrillation: Secondary | ICD-10-CM | POA: Diagnosis not present

## 2017-10-08 DIAGNOSIS — Z5181 Encounter for therapeutic drug level monitoring: Secondary | ICD-10-CM | POA: Diagnosis not present

## 2017-10-08 LAB — POCT INR: INR: 2.2

## 2017-10-08 NOTE — Telephone Encounter (Signed)
Pred 5 mg one daily for 5 days entered pls send to her pharmacy after you speak with her

## 2017-10-08 NOTE — Telephone Encounter (Signed)
Pt is calling in stating that she went to the [facility], her legs was hurting so bad, they gave her Predisone, and that helped, and she wants some more if possible.

## 2017-10-08 NOTE — Progress Notes (Signed)
INR is 2.2

## 2017-10-08 NOTE — Patient Instructions (Signed)
Continue coumadin 2 tablets daily except 1 tablet on Tuesdays, Thursdays and Saturdays.  Recheck in 4 weeks.

## 2017-10-08 NOTE — Telephone Encounter (Signed)
Went to urgent care Sunday for right hip and leg pain. Had been flared up on her for a few days.  They gave her prednisone and it helped and and wants more sent in. Please advise

## 2017-10-09 ENCOUNTER — Other Ambulatory Visit: Payer: Self-pay

## 2017-10-09 MED ORDER — PREDNISONE 5 MG PO TABS: 5.0000 mg | ORAL_TABLET | Freq: Every day | ORAL | 0 refills | Status: DC

## 2017-10-09 NOTE — Telephone Encounter (Signed)
Sent and patient aware

## 2017-10-18 ENCOUNTER — Telehealth: Payer: Self-pay | Admitting: Family Medicine

## 2017-10-18 ENCOUNTER — Other Ambulatory Visit: Payer: Self-pay

## 2017-10-18 MED ORDER — ALLOPURINOL 100 MG PO TABS: 100.0000 mg | ORAL_TABLET | Freq: Every day | ORAL | 1 refills | Status: DC

## 2017-10-18 MED ORDER — TOPROL XL 100 MG PO TB24: 100.0000 mg | ORAL_TABLET | Freq: Every day | ORAL | 1 refills | Status: DC

## 2017-10-18 MED ORDER — SYNTHROID 75 MCG PO TABS: ORAL_TABLET | ORAL | 1 refills | Status: DC

## 2017-10-18 NOTE — Telephone Encounter (Signed)
Patient refills sent

## 2017-10-18 NOTE — Telephone Encounter (Signed)
Patient is requesting refill for synthroid, toprol, and allopurinol.  Pharmacy: [facility]

## 2017-10-24 ENCOUNTER — Telehealth: Payer: Self-pay | Admitting: Orthopaedic Surgery

## 2017-10-24 MED ORDER — HYDROCODONE-ACETAMINOPHEN 7.5-325 MG PO TABS: 1.0000 | ORAL_TABLET | Freq: Four times a day (QID) | ORAL | 0 refills | Status: DC | PRN

## 2017-10-24 NOTE — Telephone Encounter (Signed)
Patient requests refill on Hydrocodone/Acewtaminophen 7.5-325  Mgs.   Qty  70   Sig: Take 1 tablet by mouth every 6 (six) hours as needed for moderate pain (Must last 30 days.Do not drive or operate machinery while taking this medicine.).   Patient states she uses [facility] in [place]

## 2017-10-30 DIAGNOSIS — M25562 Pain in left knee: Secondary | ICD-10-CM | POA: Diagnosis not present

## 2017-10-30 DIAGNOSIS — M25561 Pain in right knee: Secondary | ICD-10-CM | POA: Diagnosis not present

## 2017-10-30 DIAGNOSIS — M170 Bilateral primary osteoarthritis of knee: Secondary | ICD-10-CM | POA: Diagnosis not present

## 2017-10-30 DIAGNOSIS — M1711 Unilateral primary osteoarthritis, right knee: Secondary | ICD-10-CM | POA: Diagnosis not present

## 2017-10-31 ENCOUNTER — Other Ambulatory Visit: Payer: Self-pay

## 2017-10-31 ENCOUNTER — Encounter (HOSPITAL_COMMUNITY): Payer: Self-pay | Admitting: Emergency Medicine

## 2017-10-31 ENCOUNTER — Emergency Department (HOSPITAL_COMMUNITY)
Admission: EM | Admit: 2017-10-31 | Discharge: 2017-10-31 | Disposition: A | Discharge status: Home / Self Care | Payer: Medicare Other | Attending: Emergency Medicine | Admitting: Emergency Medicine

## 2017-10-31 ENCOUNTER — Emergency Department (HOSPITAL_COMMUNITY): Payer: Medicare Other

## 2017-10-31 DIAGNOSIS — E039 Hypothyroidism, unspecified: Secondary | ICD-10-CM | POA: Insufficient documentation

## 2017-10-31 DIAGNOSIS — E1122 Type 2 diabetes mellitus with diabetic chronic kidney disease: Secondary | ICD-10-CM | POA: Diagnosis not present

## 2017-10-31 DIAGNOSIS — I130 Hypertensive heart and chronic kidney disease with heart failure and stage 1 through stage 4 chronic kidney disease, or unspecified chronic kidney disease: Secondary | ICD-10-CM | POA: Diagnosis not present

## 2017-10-31 DIAGNOSIS — Z79899 Other long term (current) drug therapy: Secondary | ICD-10-CM | POA: Diagnosis not present

## 2017-10-31 DIAGNOSIS — I5022 Chronic systolic (congestive) heart failure: Secondary | ICD-10-CM | POA: Diagnosis not present

## 2017-10-31 DIAGNOSIS — Z950 Presence of cardiac pacemaker: Secondary | ICD-10-CM | POA: Insufficient documentation

## 2017-10-31 DIAGNOSIS — J449 Chronic obstructive pulmonary disease, unspecified: Secondary | ICD-10-CM | POA: Diagnosis not present

## 2017-10-31 DIAGNOSIS — Z87891 Personal history of nicotine dependence: Secondary | ICD-10-CM | POA: Diagnosis not present

## 2017-10-31 DIAGNOSIS — J101 Influenza due to other identified influenza virus with other respiratory manifestations: Secondary | ICD-10-CM | POA: Diagnosis not present

## 2017-10-31 DIAGNOSIS — R05 Cough: Secondary | ICD-10-CM | POA: Diagnosis not present

## 2017-10-31 DIAGNOSIS — N184 Chronic kidney disease, stage 4 (severe): Secondary | ICD-10-CM | POA: Insufficient documentation

## 2017-10-31 LAB — CBC WITH DIFFERENTIAL/PLATELET
Basophils Absolute: 0 10*3/uL (ref 0.0–0.1)
Basophils Relative: 0 %
Eosinophils Absolute: 0 10*3/uL (ref 0.0–0.7)
Eosinophils Relative: 0 %
HCT: 32.6 % — ABNORMAL LOW (ref 36.0–46.0)
Hemoglobin: 10.2 g/dL — ABNORMAL LOW (ref 12.0–15.0)
Lymphocytes Relative: 11 %
Lymphs Abs: 0.6 10*3/uL — ABNORMAL LOW (ref 0.7–4.0)
MCH: 30.1 pg (ref 26.0–34.0)
MCHC: 31.3 g/dL (ref 30.0–36.0)
MCV: 96.2 fL (ref 78.0–100.0)
Monocytes Absolute: 0.6 10*3/uL (ref 0.1–1.0)
Monocytes Relative: 11 %
Neutro Abs: 4.3 10*3/uL (ref 1.7–7.7)
Neutrophils Relative %: 78 %
Platelets: 157 10*3/uL (ref 150–400)
RBC: 3.39 MIL/uL — ABNORMAL LOW (ref 3.87–5.11)
RDW: 15.7 % — ABNORMAL HIGH (ref 11.5–15.5)
WBC: 5.6 10*3/uL (ref 4.0–10.5)

## 2017-10-31 LAB — LACTIC ACID, PLASMA: Lactic Acid, Venous: 1.3 mmol/L (ref 0.5–1.9)

## 2017-10-31 LAB — COMPREHENSIVE METABOLIC PANEL
ALT: 16 U/L (ref 14–54)
AST: 30 U/L (ref 15–41)
Albumin: 4.1 g/dL (ref 3.5–5.0)
Alkaline Phosphatase: 48 U/L (ref 38–126)
Anion gap: 13 (ref 5–15)
BUN: 48 mg/dL — ABNORMAL HIGH (ref 6–20)
CO2: 26 mmol/L (ref 22–32)
Calcium: 9.5 mg/dL (ref 8.9–10.3)
Chloride: 103 mmol/L (ref 101–111)
Creatinine, Ser: 2.03 mg/dL — ABNORMAL HIGH (ref 0.44–1.00)
GFR calc Af Amer: 25 mL/min — ABNORMAL LOW (ref >60)
GFR calc non Af Amer: 22 mL/min — ABNORMAL LOW (ref >60)
Glucose, Bld: 114 mg/dL — ABNORMAL HIGH (ref 65–99)
Potassium: 4.5 mmol/L (ref 3.5–5.1)
Sodium: 142 mmol/L (ref 135–145)
Total Bilirubin: 0.7 mg/dL (ref 0.3–1.2)
Total Protein: 8.3 g/dL — ABNORMAL HIGH (ref 6.5–8.1)

## 2017-10-31 LAB — PROTIME-INR
INR: 2.86
Prothrombin Time: 29.7 seconds — ABNORMAL HIGH (ref 11.4–15.2)

## 2017-10-31 LAB — INFLUENZA PANEL BY PCR (TYPE A & B)
Influenza A By PCR: POSITIVE — AB
Influenza B By PCR: NEGATIVE

## 2017-10-31 LAB — CBG MONITORING, ED: Glucose-Capillary: 91 mg/dL (ref 65–99)

## 2017-10-31 MED ORDER — ACETAMINOPHEN 325 MG PO TABS
650.0000 mg | ORAL_TABLET | Freq: Once | ORAL | Status: AC
  Administered 2017-10-31: 650 mg via ORAL
  Filled 2017-10-31: qty 2

## 2017-10-31 MED ORDER — IBUPROFEN 800 MG PO TABS
800.0000 mg | ORAL_TABLET | Freq: Once | ORAL | Status: AC
  Administered 2017-10-31: 800 mg via ORAL

## 2017-10-31 MED ORDER — OSELTAMIVIR PHOSPHATE 30 MG PO CAPS
30.0000 mg | ORAL_CAPSULE | Freq: Once | ORAL | Status: AC
  Administered 2017-10-31: 30 mg via ORAL
  Filled 2017-10-31: qty 1

## 2017-10-31 MED ORDER — ONDANSETRON 4 MG PO TBDP: 4.0000 mg | ORAL_TABLET | Freq: Three times a day (TID) | ORAL | 0 refills | Status: DC | PRN

## 2017-10-31 MED ORDER — IBUPROFEN 800 MG PO TABS
ORAL_TABLET | ORAL | Status: AC
  Administered 2017-10-31: 800 mg via ORAL
  Filled 2017-10-31: qty 1

## 2017-10-31 MED ORDER — OSELTAMIVIR PHOSPHATE 30 MG PO CAPS: 30.0000 mg | ORAL_CAPSULE | Freq: Two times a day (BID) | ORAL | 0 refills | Status: DC

## 2017-10-31 NOTE — ED Triage Notes (Signed)
Patient reports cough since Monday. Unknown if fever.

## 2017-10-31 NOTE — ED Provider Notes (Signed)
[facility] EMERGENCY DEPARTMENT Provider Note   CSN: [id number] Arrival date & time: 10/31/17  1202     History   Chief Complaint Chief Complaint  Patient presents with  . Cough    HPI [name] [name] is a 84 y.o. female.  HPI  84 year old female with a history of COPD, CHF, aortic and mitral valve replacement on warfarin, complete heart block with pacemaker presents with cough since yesterday.  She is coughing white sputum with occasional small amount of blood.  She denies being short of breath.  She took a breathing treatment just prior to arrival.  She did not know she had a fever until her temperature was checked here and it was 102.9.  She has had a mild headache yesterday but that has resolved.  She has a little bit of sore throat and has felt some body aches.  Vomited once yesterday but none today.  Past Medical History:  Diagnosis Date  . Adenomatous polyp of colon 12/16/2002   Dr. [name]/[facility]  . Allergy   . Cataract   . CHB (complete heart block) ([place]) 10/07/2014      . CHF (congestive heart failure) ([place])       . Chronic kidney disease, stage I 2006  . Constipation       . COPD (chronic obstructive pulmonary disease) ([place])       . DJD (degenerative joint disease) of lumbar spine   . DM (diabetes mellitus) ([place]) 2006  . Esophageal reflux   . Glaucoma   . Gout       . Heart murmur   . Hyperlipemia   . IDA (iron deficiency anemia)    Parenteral iron/[name]  . Insomnia       . Non-ischemic cardiomyopathy ([place])    a. s/p MDT CRTD  . Obesity, unspecified   . Other and unspecified hyperlipidemia   . Oxygen deficiency 2014   nocturnal;  . Spondylolisthesis   . Spondylosis   . Thyroid cancer ([place])    remote thyroidectomy, no recurrence, pt denies in 2016 thyroid cancer  . Unspecified hypothyroidism       . 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 Vertigo         Patient Active Problem  List   Diagnosis Date Noted  . Trochanteric bursitis, right hip 03/07/2017  . SOB (shortness of breath) 11/16/2016  . Persistent atrial fibrillation ([place])   . Nocturnal hypoxia 10/08/2016  . Dependence on nocturnal oxygen therapy 10/08/2016  . S/P ICD (internal cardiac defibrillator) procedure 02/08/2015  . CHB (complete heart block) ([place]) 10/07/2014  . Fatigue 07/20/2014  . Anemia 06/24/2014  . Osteopenia 02/10/2014  . Encounter for therapeutic drug monitoring 10/29/2013  . Spinal stenosis of lumbar region 02/19/2013  . OA (osteoarthritis) of knee 02/19/2013  . Chronic pain of right knee 01/02/2013  . Hemolytic anemia ([place]) 09/24/2012  . H/O adenomatous polyp of colon 05/24/2012  . High risk medication use 05/24/2012  . COPD (chronic obstructive pulmonary disease) ([place]) 05/19/2012  . Chronic systolic heart failure ([place]) 05/19/2012  . Chronic anticoagulation, on coumadin for Mechanical AVR and MVR 04/01/2012  . Cardiorenal syndrome with renal failure 03/22/2012  . S/P AVR (aortic valve replacement) 03/22/2012  . S/P MVR (mitral valve replacement) 03/22/2012  . AICD (automatic cardioverter/defibrillator) present 03/22/2012  . CKD (chronic kidney disease), stage IV ([place]) 03/22/2012  . Warfarin-induced coagulopathy ([place]) 03/21/2012  . Papilloma of breast 03/06/2012  .  Iron deficiency 10/04/2011  . Allergic rhinitis 02/14/2011  . Nausea without vomiting 06/07/2010  . HIP PAIN, RIGHT 04/23/2009  . VARICOSE VEINS LOWER EXTREMITIES W/OTH COMPS 02/09/2009  . Calculus of gallbladder with chronic cholecystitis without obstruction 02/09/2009  . Prediabetes 06/15/2008  . Hypothyroidism, postsurgical 02/24/2008  . Hyperlipidemia LDL goal <70 02/24/2008  . Mild obesity 02/24/2008  . Essential hypertension 02/24/2008  . Non-ischemic cardiomyopathy with ICD 02/24/2008  . GERD 02/24/2008  . DEGENERATIVE JOINT DISEASE, SPINE 02/24/2008    Past Surgical History:  Procedure Laterality Date    . AORTIC AND MITRAL VALVE REPLACEMENT  2001  . BI-VENTRICULAR IMPLANTABLE CARDIOVERTER DEFIBRILLATOR UPGRADE N/A 01/18/2015   Procedure: BI-VENTRICULAR IMPLANTABLE CARDIOVERTER DEFIBRILLATOR UPGRADE;  Surgeon: Evans Lance, MD;  Location: [facility] CATH LAB;  Service: Cardiovascular;  Laterality: N/A;  . CARDIAC CATHETERIZATION    . CARDIAC VALVE REPLACEMENT    . CARDIOVERSION N/A 11/13/2016   Procedure: CARDIOVERSION;  Surgeon: Sanda Klein, MD;  Location: [facility] ENDOSCOPY;  Service: Cardiovascular;  Laterality: N/A;  . COLONOSCOPY  06/12/2007   Rourk- Normal rectum/Normal colon  . COLONOSCOPY  12/16/02   small hemorrhoids  . COLONOSCOPY  06/20/2012   Procedure: COLONOSCOPY;  Surgeon: Daneil Dolin, MD;  Location: [facility] ENDO SUITE;  Service: Endoscopy;  Laterality: N/A;  10:45  . defibrillator implanted 2006    . DOPPLER ECHOCARDIOGRAPHY  2012  . DOPPLER ECHOCARDIOGRAPHY  05,06,07,08,09,2011  . ESOPHAGOGASTRODUODENOSCOPY  06/12/2007   Rourk- normal esophagus, small hiatal hernia, otherwise normal stomach, D1, D2  . EYE SURGERY Right 2005  . EYE SURGERY Left 2006  . ICD LEAD REMOVAL Left 02/08/2015   Procedure: ICD LEAD REMOVAL;  Surgeon: Evans Lance, MD;  Location: [facility] OR;  Service: Cardiovascular;  Laterality: Left;  "Will plan extraction and insertion of a BiV PM"  **Dr. Roxan Hockey backing up case**  . IMPLANTABLE CARDIOVERTER DEFIBRILLATOR (ICD) GENERATOR CHANGE Left 02/08/2015   Procedure: ICD GENERATOR CHANGE;  Surgeon: Evans Lance, MD;  Location: [place];  Service: Cardiovascular;  Laterality: Left;  . INSERT / REPLACE / REMOVE PACEMAKER    . PACEMAKER INSERTION  May 2016  . pacemaker placed  2004  . right breast cyst removed     benign   . right cataract removed     2005  . TEE WITHOUT CARDIOVERSION N/A 11/13/2016   Procedure: TRANSESOPHAGEAL ECHOCARDIOGRAM (TEE);  Surgeon: Sanda Klein, MD;  Location: [facility] ENDOSCOPY;  Service: Cardiovascular;  Laterality: N/A;  . THYROIDECTOMY       OB History    Gravida Para Term Preterm AB Living             0   SAB TAB Ectopic Multiple Live Births                   Home Medications    Prior to Admission medications   Medication Sig Start Date End Date Taking? Authorizing Provider  acetaminophen (TYLENOL) 500 MG tablet Take 500 mg by mouth every 6 (six) hours as needed (pain).    [provider]  albuterol (PROVENTIL) (2.5 MG/3ML) 0.083% nebulizer solution INHALE 1 VIAL VIA NEBULIZER THREE TIMES DAILY. Patient taking differently: INHALE 1 VIAL VIA NEBULIZER THREE TIMES DAILY AS NEEDED 12/28/16   Fayrene Helper, MD  albuterol (VENTOLIN HFA) 108 (90 Base) MCG/ACT inhaler Inhale 2 puffs into the lungs every 6 (six) hours as needed for wheezing or shortness of breath. 10/19/16   Fayrene Helper, MD  allopurinol (ZYLOPRIM) 100  MG tablet Take 1 tablet (100 mg total) by mouth daily. 10/18/17   Fayrene Helper, MD  azelastine (ASTELIN) 0.1 % nasal spray Place 2 sprays into both nostrils 2 (two) times daily. Use in each nostril as directed Patient taking differently: Place 2 sprays into both nostrils daily as needed for allergies. Use in each nostril as directed 10/04/15   Fayrene Helper, MD  calcitRIOL (ROCALTROL) 0.25 MCG capsule Take 0.25 mcg by mouth daily.  02/13/17   [provider]  calcium carbonate (OS-CAL) 1250 (500 Ca) MG chewable tablet Chew 1 tablet by mouth daily. 07/15/10   [provider]  cholecalciferol (VITAMIN D) 1000 UNITS tablet Take 1,000 Units by mouth daily.    [provider]  COUMADIN 5 MG tablet Take 2 tablets daily except 1 tablet on Tuesdays Patient taking differently: Take 1 tablet Tuesday, Thursday, and Saturday and  Take 2 tablets all other days. 09/14/17   Evans Lance, MD  digoxin (LANOXIN) 0.125 MG tablet Take 1 tablet (0.125 mg total) by mouth every other day. 07/23/17   Lendon Colonel, NP  docusate sodium (COLACE) 100 MG capsule Take 300 mg by  mouth at bedtime.    [provider]  fluticasone (FLONASE) 50 MCG/ACT nasal spray USE 2 SPRAYS IN EACH       NOSTRIL ONCE DAILY Patient taking differently: USE 2 SPRAYS IN EACH       NOSTRIL ONCE DAILY AS NEEDED FOR CONGESTION 10/25/16   Fayrene Helper, MD  folic acid (FOLVITE) 1 MG tablet Take 1 tablet (1 mg total) by mouth daily. 05/01/17   Fayrene Helper, MD  furosemide (LASIX) 40 MG tablet Take 1 tablet by mouth  daily 07/23/17   Lendon Colonel, NP  gabapentin (NEURONTIN) 300 MG capsule Take 1 capsule (300 mg total) by mouth at bedtime. 04/16/17   Fayrene Helper, MD  hydrALAZINE (APRESOLINE) 25 MG tablet Take 1 tablet (25 mg total) by mouth 3 (three) times daily. 07/23/17   Lendon Colonel, NP  HYDROcodone-acetaminophen (NORCO) 7.5-325 MG tablet Take 1 tablet by mouth every 6 (six) hours as needed for moderate pain (Must last 30 days.). 10/24/17   Sanjuana Kava, MD  isosorbide mononitrate (IMDUR) 60 MG 24 hr tablet Take 1 tablet (60 mg total) by mouth daily. 07/23/17   Lendon Colonel, NP  losartan (COZAAR) 50 MG tablet take 1 tablet by mouth once daily 06/20/17   Fayrene Helper, MD  meclizine (ANTIVERT) 25 MG tablet Take 1 tablet (25 mg total) by mouth 3 (three) times daily as needed for dizziness. 05/01/17   Fayrene Helper, MD  metolazone (ZAROXOLYN) 2.5 MG tablet TAKE 1 TABLET ONCE A WEEK 07/23/17   Lendon Colonel, NP  ondansetron (ZOFRAN ODT) 4 MG disintegrating tablet Take 1 tablet (4 mg total) by mouth every 8 (eight) hours as needed for nausea or vomiting. 10/31/17   Sherwood Gambler, MD  ondansetron (ZOFRAN) 4 MG tablet Take 1 tablet (4 mg total) by mouth daily as needed for nausea or vomiting. 05/01/17   Fayrene Helper, MD  oseltamivir (TAMIFLU) 30 MG capsule Take 1 capsule (30 mg total) by mouth 2 (two) times daily. 10/31/17   Sherwood Gambler, MD  Polyethyl [name] (SYSTANE OP) Place 1 drop into both eyes daily as needed (dry  eyes).    [provider]  potassium chloride SA (KLOR-CON M20) 20 MEQ tablet Take 1 tablet by mouth  daily 07/23/17  Lendon Colonel, NP  pravastatin (PRAVACHOL) 40 MG tablet Take 1 tablet (40 mg total) by mouth every evening. 07/23/17 10/21/17  Lendon Colonel, NP  predniSONE (DELTASONE) 5 MG tablet Take 1 tablet (5 mg total) by mouth daily with breakfast. 10/09/17   Fayrene Helper, MD  promethazine-dextromethorphan (PROMETHAZINE-DM) 6.25-15 MG/5ML syrup One teaspoon at bedtime, as needed, for excessive cough Patient taking differently: Take 5 mLs by mouth at bedtime as needed for cough. as needed, for excessive cough 10/04/16   Fayrene Helper, MD  SYNTHROID 75 MCG tablet Take 1 tablet by mouth  daily 10/18/17   Fayrene Helper, MD  TOPROL XL 100 MG 24 hr tablet Take 1 tablet (100 mg total) by mouth daily. Take with or immediately following a meal. 10/18/17   Fayrene Helper, MD  UNABLE TO Lorayne Marek Gilford Rile with seat x 1 03/22/17   Fayrene Helper, MD    Family History Family History  Problem Relation Age of Onset  . Pancreatic cancer Mother   . Heart disease Father   . Heart disease Sister   . Heart attack Sister   . Heart disease Brother   . Diabetes Brother   . Heart disease Brother   . Diabetes Brother   . Hypertension Brother   . Lung cancer Brother     Social History Social History   Tobacco Use  . Smoking status: Former Smoker    Packs/day: 0.75    Years: 40.00    Pack years: 30.00    Types: Cigarettes    Last attempt to quit: 03/08/1987    Years since quitting: 30.6  . Smokeless tobacco: Never Used  Substance Use Topics  . Alcohol use: No    Alcohol/week: 0.0 oz  . Drug use: No     Allergies   Penicillins; Aspirin; Fentanyl; and Niacin   Review of Systems Review of Systems  Constitutional: Negative for fever.  HENT: Positive for sore throat.   Respiratory: Positive for cough. Negative for shortness of breath.   Cardiovascular:  Negative for chest pain and leg swelling.  Gastrointestinal: Positive for vomiting.  Musculoskeletal: Positive for myalgias.  Neurological: Positive for headaches.  All other systems reviewed and are negative.    Physical Exam Updated Vital Signs BP 135/60   Pulse 70   Temp 99 F (37.2 C) (Oral)   Resp 18   Ht 5\' 10"  (1.778 m)   Wt 99.8 kg (220 lb)   SpO2 91%   BMI 31.57 kg/m   Physical Exam  Constitutional: She is oriented to person, place, and time. She appears well-developed and well-nourished. No distress.  HENT:  Head: Normocephalic and atraumatic.  Right Ear: External ear normal.  Left Ear: External ear normal.  Nose: Nose normal.  Eyes: Right eye exhibits no discharge. Left eye exhibits no discharge.  Cardiovascular: Normal rate and regular rhythm.  Murmur (click) heard. Pulmonary/Chest: Effort normal and breath sounds normal. No accessory muscle usage. No tachypnea. She has no wheezes. She has no rales.  Abdominal: Soft. There is no tenderness.  Musculoskeletal: She exhibits no edema.  Neurological: She is alert and oriented to person, place, and time.  Skin: Skin is warm and dry. She is not diaphoretic.  Nursing note and vitals reviewed.    ED Treatments / Results  Labs (all labs ordered are listed, but only abnormal results are displayed) Labs Reviewed  COMPREHENSIVE METABOLIC PANEL - Abnormal; Notable for the following components:  Result Value   Glucose, Bld 114 (*)    BUN 48 (*)    Creatinine, Ser 2.03 (*)    Total Protein 8.3 (*)    GFR calc non Af Amer 22 (*)    GFR calc Af Amer 25 (*)    All other components within normal limits  CBC WITH DIFFERENTIAL/PLATELET - Abnormal; Notable for the following components:   RBC 3.39 (*)    Hemoglobin 10.2 (*)    HCT 32.6 (*)    RDW 15.7 (*)    Lymphs Abs 0.6 (*)    All other components within normal limits  PROTIME-INR - Abnormal; Notable for the following components:   Prothrombin Time 29.7 (*)     All other components within normal limits  INFLUENZA PANEL BY PCR (TYPE A & B) - Abnormal; Notable for the following components:   Influenza A By PCR POSITIVE (*)    All other components within normal limits  LACTIC ACID, PLASMA  CBG MONITORING, ED    EKG  EKG Interpretation None       Radiology Dg Chest 2 View  Result Date: 10/31/2017 CLINICAL DATA:  Cough EXAM: CHEST  2 VIEW COMPARISON:  08/31/2017 FINDINGS: Cardiac enlargement without heart failure. Aortic and mitral valve replacement unchanged in position. Internal cardiac defibrillator leads unchanged from the prior study. Lungs are clear without infiltrate effusion or mass. Atherosclerotic aorta. IMPRESSION: No active cardiopulmonary disease. Electronically Signed   By: Franchot Gallo M.D.   On: 10/31/2017 13:44    Procedures Procedures (including critical care time)  Medications Ordered in ED Medications  ibuprofen (ADVIL,MOTRIN) tablet 800 mg (800 mg Oral Given 10/31/17 1426)  acetaminophen (TYLENOL) tablet 650 mg (650 mg Oral Given 10/31/17 1442)  oseltamivir (TAMIFLU) capsule 30 mg (30 mg Oral Given 10/31/17 1707)     Initial Impression / Assessment and Plan / ED Course  I have reviewed the triage vital signs and the nursing notes.  Pertinent labs & imaging results that were available during my care of the patient were reviewed by me and considered in my medical decision making (see chart for details).     Patient appears quite well.  She has no increased work of breathing.  Her O2 sats are borderline low but in the low 90s.  However she also has oxygen at home she can use.  Her presentation and workup is consistent with influenza.  Given her symptoms started less than 24 hours ago and she has significant comorbidities I have recommended Tamiflu.  She agrees to this.  She does not appear to have a pneumonia.  She has noticed some small amount of hemoptysis but none now.  She is not febrile currently.  Likely has a milder  course due to her influenza vaccination earlier this this season.  She has a mild bump in her creatinine but not consistent with renal failure.  She was encouraged to increase fluids at home.  She has not any vomiting now but will give Zofran in case nausea returns.  However I think she is stable for an outpatient treatment of influenza.  We discussed return precautions and need for close PCP follow-up.  Final Clinical Impressions(s) / ED Diagnoses   Final diagnoses:  Influenza A    ED Discharge Orders        Ordered    oseltamivir (TAMIFLU) 30 MG capsule  2 times daily     10/31/17 1651    ondansetron (ZOFRAN ODT) 4 MG disintegrating tablet  Every 8 hours PRN     10/31/17 1652       Sherwood Gambler, MD 10/31/17 2103

## 2017-10-31 NOTE — ED Notes (Signed)
Pt had said she had taken hydrocodone earlier but then denies taking it.  800mg  ibuprofen given for fever.

## 2017-11-01 ENCOUNTER — Observation Stay (HOSPITAL_COMMUNITY)
Admission: EM | Admit: 2017-11-01 | Discharge: 2017-11-02 | Disposition: A | Discharge status: Home / Self Care | Payer: Medicare Other | Attending: Internal Medicine | Admitting: Internal Medicine

## 2017-11-01 ENCOUNTER — Other Ambulatory Visit: Payer: Self-pay

## 2017-11-01 ENCOUNTER — Emergency Department (HOSPITAL_COMMUNITY): Payer: Medicare Other

## 2017-11-01 ENCOUNTER — Other Ambulatory Visit (HOSPITAL_COMMUNITY): Payer: Medicare Other

## 2017-11-01 ENCOUNTER — Ambulatory Visit (HOSPITAL_COMMUNITY): Payer: Medicare Other

## 2017-11-01 DIAGNOSIS — E890 Postprocedural hypothyroidism: Secondary | ICD-10-CM | POA: Insufficient documentation

## 2017-11-01 DIAGNOSIS — J410 Simple chronic bronchitis: Secondary | ICD-10-CM

## 2017-11-01 DIAGNOSIS — Z79899 Other long term (current) drug therapy: Secondary | ICD-10-CM | POA: Diagnosis not present

## 2017-11-01 DIAGNOSIS — R0602 Shortness of breath: Secondary | ICD-10-CM | POA: Diagnosis not present

## 2017-11-01 DIAGNOSIS — J441 Chronic obstructive pulmonary disease with (acute) exacerbation: Secondary | ICD-10-CM | POA: Insufficient documentation

## 2017-11-01 DIAGNOSIS — R509 Fever, unspecified: Secondary | ICD-10-CM | POA: Diagnosis not present

## 2017-11-01 DIAGNOSIS — I1310 Hypertensive heart and chronic kidney disease without heart failure, with stage 1 through stage 4 chronic kidney disease, or unspecified chronic kidney disease: Secondary | ICD-10-CM | POA: Diagnosis present

## 2017-11-01 DIAGNOSIS — Z7901 Long term (current) use of anticoagulants: Secondary | ICD-10-CM | POA: Diagnosis not present

## 2017-11-01 DIAGNOSIS — J9621 Acute and chronic respiratory failure with hypoxia: Secondary | ICD-10-CM | POA: Diagnosis not present

## 2017-11-01 DIAGNOSIS — I5033 Acute on chronic diastolic (congestive) heart failure: Secondary | ICD-10-CM | POA: Diagnosis present

## 2017-11-01 DIAGNOSIS — J111 Influenza due to unidentified influenza virus with other respiratory manifestations: Principal | ICD-10-CM | POA: Insufficient documentation

## 2017-11-01 DIAGNOSIS — Z95810 Presence of automatic (implantable) cardiac defibrillator: Secondary | ICD-10-CM | POA: Diagnosis not present

## 2017-11-01 DIAGNOSIS — I130 Hypertensive heart and chronic kidney disease with heart failure and stage 1 through stage 4 chronic kidney disease, or unspecified chronic kidney disease: Secondary | ICD-10-CM | POA: Insufficient documentation

## 2017-11-01 DIAGNOSIS — I10 Essential (primary) hypertension: Secondary | ICD-10-CM | POA: Diagnosis present

## 2017-11-01 DIAGNOSIS — J449 Chronic obstructive pulmonary disease, unspecified: Secondary | ICD-10-CM | POA: Diagnosis present

## 2017-11-01 DIAGNOSIS — J209 Acute bronchitis, unspecified: Secondary | ICD-10-CM | POA: Diagnosis present

## 2017-11-01 DIAGNOSIS — I5022 Chronic systolic (congestive) heart failure: Secondary | ICD-10-CM | POA: Diagnosis not present

## 2017-11-01 DIAGNOSIS — N184 Chronic kidney disease, stage 4 (severe): Secondary | ICD-10-CM | POA: Diagnosis present

## 2017-11-01 DIAGNOSIS — Z954 Presence of other heart-valve replacement: Secondary | ICD-10-CM | POA: Insufficient documentation

## 2017-11-01 DIAGNOSIS — Z87891 Personal history of nicotine dependence: Secondary | ICD-10-CM | POA: Insufficient documentation

## 2017-11-01 DIAGNOSIS — I5032 Chronic diastolic (congestive) heart failure: Secondary | ICD-10-CM

## 2017-11-01 DIAGNOSIS — Z9981 Dependence on supplemental oxygen: Secondary | ICD-10-CM | POA: Diagnosis not present

## 2017-11-01 DIAGNOSIS — N19 Unspecified kidney failure: Secondary | ICD-10-CM | POA: Diagnosis not present

## 2017-11-01 DIAGNOSIS — J9601 Acute respiratory failure with hypoxia: Secondary | ICD-10-CM | POA: Diagnosis present

## 2017-11-01 LAB — BRAIN NATRIURETIC PEPTIDE: B Natriuretic Peptide: 303 pg/mL — ABNORMAL HIGH (ref 0.0–100.0)

## 2017-11-01 LAB — CBC WITH DIFFERENTIAL/PLATELET
Basophils Absolute: 0 10*3/uL (ref 0.0–0.1)
Basophils Relative: 0 %
Eosinophils Absolute: 0 10*3/uL (ref 0.0–0.7)
Eosinophils Relative: 0 %
HCT: 29 % — ABNORMAL LOW (ref 36.0–46.0)
Hemoglobin: 9.2 g/dL — ABNORMAL LOW (ref 12.0–15.0)
Lymphocytes Relative: 18 %
Lymphs Abs: 1.1 10*3/uL (ref 0.7–4.0)
MCH: 29.9 pg (ref 26.0–34.0)
MCHC: 31.7 g/dL (ref 30.0–36.0)
MCV: 94.2 fL (ref 78.0–100.0)
Monocytes Absolute: 0.9 10*3/uL (ref 0.1–1.0)
Monocytes Relative: 16 %
Neutro Abs: 4 10*3/uL (ref 1.7–7.7)
Neutrophils Relative %: 66 %
Platelets: 162 10*3/uL (ref 150–400)
RBC: 3.08 MIL/uL — ABNORMAL LOW (ref 3.87–5.11)
RDW: 16.3 % — ABNORMAL HIGH (ref 11.5–15.5)
WBC: 6 10*3/uL (ref 4.0–10.5)

## 2017-11-01 LAB — COMPREHENSIVE METABOLIC PANEL
ALT: 16 U/L (ref 14–54)
AST: 31 U/L (ref 15–41)
Albumin: 3.7 g/dL (ref 3.5–5.0)
Alkaline Phosphatase: 41 U/L (ref 38–126)
Anion gap: 12 (ref 5–15)
BUN: 54 mg/dL — ABNORMAL HIGH (ref 6–20)
CO2: 23 mmol/L (ref 22–32)
Calcium: 9.1 mg/dL (ref 8.9–10.3)
Chloride: 102 mmol/L (ref 101–111)
Creatinine, Ser: 1.93 mg/dL — ABNORMAL HIGH (ref 0.44–1.00)
GFR calc Af Amer: 27 mL/min — ABNORMAL LOW (ref >60)
GFR calc non Af Amer: 23 mL/min — ABNORMAL LOW (ref >60)
Glucose, Bld: 105 mg/dL — ABNORMAL HIGH (ref 65–99)
Potassium: 3.9 mmol/L (ref 3.5–5.1)
Sodium: 137 mmol/L (ref 135–145)
Total Bilirubin: 0.9 mg/dL (ref 0.3–1.2)
Total Protein: 7.3 g/dL (ref 6.5–8.1)

## 2017-11-01 LAB — DIGOXIN LEVEL: Digoxin Level: 0.6 ng/mL — ABNORMAL LOW (ref 0.8–2.0)

## 2017-11-01 MED ORDER — HYDRALAZINE HCL 25 MG PO TABS
25.0000 mg | ORAL_TABLET | Freq: Three times a day (TID) | ORAL | Status: DC
  Administered 2017-11-02 (×3): 25 mg via ORAL
  Filled 2017-11-01 (×4): qty 1

## 2017-11-01 MED ORDER — ACETAMINOPHEN 500 MG PO TABS: 500.0000 mg | ORAL_TABLET | Freq: Four times a day (QID) | ORAL | Status: DC | PRN

## 2017-11-01 MED ORDER — CALCIUM CARBONATE 1250 (500 CA) MG PO TABS
1250.0000 mg | ORAL_TABLET | Freq: Every day | ORAL | Status: DC
  Administered 2017-11-02: 1250 mg via ORAL
  Filled 2017-11-01: qty 1

## 2017-11-01 MED ORDER — ALLOPURINOL 100 MG PO TABS
100.0000 mg | ORAL_TABLET | Freq: Every day | ORAL | Status: DC
  Administered 2017-11-02 (×2): 100 mg via ORAL
  Filled 2017-11-01 (×2): qty 1

## 2017-11-01 MED ORDER — SODIUM CHLORIDE 0.9 % IV SOLN: 250.0000 mL | INTRAVENOUS | Status: DC | PRN

## 2017-11-01 MED ORDER — SODIUM CHLORIDE 0.9 % IV BOLUS (SEPSIS)
500.0000 mL | Freq: Once | INTRAVENOUS | Status: AC
  Administered 2017-11-01: 500 mL via INTRAVENOUS

## 2017-11-01 MED ORDER — PREDNISONE 50 MG PO TABS
60.0000 mg | ORAL_TABLET | Freq: Once | ORAL | Status: AC
  Administered 2017-11-01: 60 mg via ORAL
  Filled 2017-11-01: qty 1

## 2017-11-01 MED ORDER — ONDANSETRON HCL 4 MG PO TABS: 4.0000 mg | ORAL_TABLET | Freq: Every day | ORAL | Status: DC | PRN

## 2017-11-01 MED ORDER — ACETAMINOPHEN 325 MG PO TABS: 650.0000 mg | ORAL_TABLET | Freq: Four times a day (QID) | ORAL | Status: DC | PRN

## 2017-11-01 MED ORDER — SODIUM CHLORIDE 0.9% FLUSH: 3.0000 mL | INTRAVENOUS | Status: DC | PRN

## 2017-11-01 MED ORDER — ISOSORBIDE MONONITRATE ER 60 MG PO TB24
60.0000 mg | ORAL_TABLET | Freq: Every day | ORAL | Status: DC
  Administered 2017-11-02: 60 mg via ORAL
  Filled 2017-11-01 (×2): qty 1

## 2017-11-01 MED ORDER — IPRATROPIUM-ALBUTEROL 0.5-2.5 (3) MG/3ML IN SOLN
3.0000 mL | Freq: Four times a day (QID) | RESPIRATORY_TRACT | Status: DC
  Administered 2017-11-02 (×2): 3 mL via RESPIRATORY_TRACT
  Filled 2017-11-01 (×3): qty 3

## 2017-11-01 MED ORDER — IPRATROPIUM BROMIDE 0.02 % IN SOLN: 0.5000 mg | Freq: Four times a day (QID) | RESPIRATORY_TRACT | Status: DC

## 2017-11-01 MED ORDER — ALBUTEROL SULFATE (2.5 MG/3ML) 0.083% IN NEBU: 2.5000 mg | INHALATION_SOLUTION | RESPIRATORY_TRACT | Status: DC | PRN

## 2017-11-01 MED ORDER — WARFARIN SODIUM 1 MG PO TABS
1.0000 mg | ORAL_TABLET | Freq: Once | ORAL | Status: AC
  Administered 2017-11-02: 1 mg via ORAL
  Filled 2017-11-01 (×2): qty 1

## 2017-11-01 MED ORDER — ONDANSETRON HCL 4 MG PO TABS: 4.0000 mg | ORAL_TABLET | Freq: Four times a day (QID) | ORAL | Status: DC | PRN

## 2017-11-01 MED ORDER — METOPROLOL SUCCINATE ER 50 MG PO TB24
100.0000 mg | ORAL_TABLET | Freq: Every day | ORAL | Status: DC
  Administered 2017-11-02: 100 mg via ORAL
  Filled 2017-11-01: qty 1
  Filled 2017-11-01: qty 2
  Filled 2017-11-01: qty 1

## 2017-11-01 MED ORDER — ONDANSETRON HCL 4 MG/2ML IJ SOLN: 4.0000 mg | Freq: Four times a day (QID) | INTRAMUSCULAR | Status: DC | PRN

## 2017-11-01 MED ORDER — MECLIZINE HCL 12.5 MG PO TABS: 25.0000 mg | ORAL_TABLET | Freq: Three times a day (TID) | ORAL | Status: DC | PRN

## 2017-11-01 MED ORDER — LOSARTAN POTASSIUM 50 MG PO TABS
50.0000 mg | ORAL_TABLET | Freq: Every day | ORAL | Status: DC
  Administered 2017-11-02 (×2): 50 mg via ORAL
  Filled 2017-11-01 (×2): qty 1

## 2017-11-01 MED ORDER — OSELTAMIVIR PHOSPHATE 30 MG PO CAPS
30.0000 mg | ORAL_CAPSULE | Freq: Two times a day (BID) | ORAL | Status: DC
  Administered 2017-11-02 (×2): 30 mg via ORAL
  Filled 2017-11-01 (×2): qty 1

## 2017-11-01 MED ORDER — DOCUSATE SODIUM 100 MG PO CAPS
300.0000 mg | ORAL_CAPSULE | Freq: Every day | ORAL | Status: DC
  Administered 2017-11-02: 300 mg via ORAL
  Filled 2017-11-01: qty 3

## 2017-11-01 MED ORDER — GABAPENTIN 300 MG PO CAPS
300.0000 mg | ORAL_CAPSULE | Freq: Every day | ORAL | Status: DC
  Administered 2017-11-02: 300 mg via ORAL
  Filled 2017-11-01: qty 1

## 2017-11-01 MED ORDER — ACETAMINOPHEN 650 MG RE SUPP: 650.0000 mg | Freq: Four times a day (QID) | RECTAL | Status: DC | PRN

## 2017-11-01 MED ORDER — PRAVASTATIN SODIUM 40 MG PO TABS
40.0000 mg | ORAL_TABLET | Freq: Every evening | ORAL | Status: DC
  Administered 2017-11-02 (×2): 40 mg via ORAL
  Filled 2017-11-01 (×2): qty 1

## 2017-11-01 MED ORDER — DIGOXIN 125 MCG PO TABS
0.1250 mg | ORAL_TABLET | ORAL | Status: DC
  Administered 2017-11-02: 0.125 mg via ORAL
  Filled 2017-11-01 (×2): qty 1

## 2017-11-01 MED ORDER — IPRATROPIUM-ALBUTEROL 0.5-2.5 (3) MG/3ML IN SOLN
3.0000 mL | Freq: Once | RESPIRATORY_TRACT | Status: AC
  Administered 2017-11-01: 3 mL via RESPIRATORY_TRACT
  Filled 2017-11-01: qty 3

## 2017-11-01 MED ORDER — WARFARIN - PHARMACIST DOSING INPATIENT
Status: DC
  Administered 2017-11-02: 18:00:00

## 2017-11-01 MED ORDER — SODIUM CHLORIDE 0.9% FLUSH: 3.0000 mL | Freq: Two times a day (BID) | INTRAVENOUS | Status: DC

## 2017-11-01 MED ORDER — ALBUTEROL SULFATE (2.5 MG/3ML) 0.083% IN NEBU: 3.0000 mL | INHALATION_SOLUTION | Freq: Four times a day (QID) | RESPIRATORY_TRACT | Status: DC | PRN

## 2017-11-01 MED ORDER — ACETAMINOPHEN 325 MG PO TABS
650.0000 mg | ORAL_TABLET | Freq: Once | ORAL | Status: AC | PRN
  Administered 2017-11-01: 650 mg via ORAL
  Filled 2017-11-01: qty 2

## 2017-11-01 MED ORDER — ALBUTEROL SULFATE (2.5 MG/3ML) 0.083% IN NEBU: 2.5000 mg | INHALATION_SOLUTION | Freq: Four times a day (QID) | RESPIRATORY_TRACT | Status: DC

## 2017-11-01 MED ORDER — FOLIC ACID 1 MG PO TABS
1.0000 mg | ORAL_TABLET | Freq: Every day | ORAL | Status: DC
  Administered 2017-11-02 (×2): 1 mg via ORAL
  Filled 2017-11-01 (×2): qty 1

## 2017-11-01 MED ORDER — ALBUTEROL SULFATE (2.5 MG/3ML) 0.083% IN NEBU
2.5000 mg | INHALATION_SOLUTION | Freq: Once | RESPIRATORY_TRACT | Status: AC
  Administered 2017-11-01: 2.5 mg via RESPIRATORY_TRACT
  Filled 2017-11-01: qty 3

## 2017-11-01 MED ORDER — FUROSEMIDE 40 MG PO TABS
40.0000 mg | ORAL_TABLET | Freq: Every day | ORAL | Status: DC
  Administered 2017-11-02: 40 mg via ORAL
  Filled 2017-11-01: qty 1

## 2017-11-01 MED ORDER — HYDROCODONE-ACETAMINOPHEN 7.5-325 MG PO TABS: 1.0000 | ORAL_TABLET | Freq: Four times a day (QID) | ORAL | Status: DC | PRN

## 2017-11-01 MED ORDER — VITAMIN D 1000 UNITS PO TABS
1000.0000 [IU] | ORAL_TABLET | Freq: Every day | ORAL | Status: DC
  Administered 2017-11-02 (×2): 1000 [IU] via ORAL
  Filled 2017-11-01 (×2): qty 1

## 2017-11-01 NOTE — ED Provider Notes (Signed)
[facility] EMERGENCY DEPARTMENT Provider Note   CSN: [id number] Arrival date & time: 11/01/17  1456     History   Chief Complaint Chief Complaint  Patient presents with  . Influenza    HPI Pecolia Marando is a 84 y.o. female.  Patient states she was diagnosed with the flu yesterday but she became very short of breath today and is wheezing   The history is provided by the patient.  Shortness of Breath  This is a new problem. The problem occurs frequently.The current episode started more than 2 days ago. The problem has not changed since onset.Associated symptoms include a fever and wheezing. Pertinent negatives include no headaches, no cough, no chest pain, no abdominal pain and no rash. Precipitated by: Influenza.    Past Medical History:  Diagnosis Date  . Adenomatous polyp of colon 12/16/2002   Dr. [name]/[facility]  . Allergy   . Cataract   . CHB (complete heart block) ([place]) 10/07/2014      . CHF (congestive heart failure) ([place])       . Chronic kidney disease, stage I 2006  . Constipation       . COPD (chronic obstructive pulmonary disease) ([place])       . DJD (degenerative joint disease) of lumbar spine   . DM (diabetes mellitus) ([place]) 2006  . Esophageal reflux   . Glaucoma   . Gout       . Heart murmur   . Hyperlipemia   . IDA (iron deficiency anemia)    Parenteral iron/[name]  . Insomnia       . Non-ischemic cardiomyopathy ([place])    a. s/p MDT CRTD  . Obesity, unspecified   . Other and unspecified hyperlipidemia   . Oxygen deficiency 2014   nocturnal;  . Spondylolisthesis   . Spondylosis   . Thyroid cancer ([place])    remote thyroidectomy, no recurrence, pt denies in 2016 thyroid cancer  . Unspecified hypothyroidism       . 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 Vertigo         Patient Active Problem List   Diagnosis Date Noted  . Trochanteric bursitis, right hip 03/07/2017  . SOB  (shortness of breath) 11/16/2016  . Persistent atrial fibrillation ([place])   . Nocturnal hypoxia 10/08/2016  . Dependence on nocturnal oxygen therapy 10/08/2016  . S/P ICD (internal cardiac defibrillator) procedure 02/08/2015  . CHB (complete heart block) ([place]) 10/07/2014  . Fatigue 07/20/2014  . Anemia 06/24/2014  . Osteopenia 02/10/2014  . Encounter for therapeutic drug monitoring 10/29/2013  . Spinal stenosis of lumbar region 02/19/2013  . OA (osteoarthritis) of knee 02/19/2013  . Chronic pain of right knee 01/02/2013  . Hemolytic anemia ([place]) 09/24/2012  . H/O adenomatous polyp of colon 05/24/2012  . High risk medication use 05/24/2012  . COPD (chronic obstructive pulmonary disease) ([place]) 05/19/2012  . Chronic systolic heart failure ([place]) 05/19/2012  . Chronic anticoagulation, on coumadin for Mechanical AVR and MVR 04/01/2012  . Cardiorenal syndrome with renal failure 03/22/2012  . S/P AVR (aortic valve replacement) 03/22/2012  . S/P MVR (mitral valve replacement) 03/22/2012  . AICD (automatic cardioverter/defibrillator) present 03/22/2012  . CKD (chronic kidney disease), stage IV ([place]) 03/22/2012  . Warfarin-induced coagulopathy ([place]) 03/21/2012  . Papilloma of breast 03/06/2012  . Iron deficiency 10/04/2011  . Allergic rhinitis 02/14/2011  . Nausea without vomiting 06/07/2010  . HIP PAIN, RIGHT 04/23/2009  . VARICOSE  VEINS LOWER EXTREMITIES W/OTH COMPS 02/09/2009  . Calculus of gallbladder with chronic cholecystitis without obstruction 02/09/2009  . Prediabetes 06/15/2008  . Hypothyroidism, postsurgical 02/24/2008  . Hyperlipidemia LDL goal <70 02/24/2008  . Mild obesity 02/24/2008  . Essential hypertension 02/24/2008  . Non-ischemic cardiomyopathy with ICD 02/24/2008  . GERD 02/24/2008  . DEGENERATIVE JOINT DISEASE, SPINE 02/24/2008    Past Surgical History:  Procedure Laterality Date  . AORTIC AND MITRAL VALVE REPLACEMENT  2001  . BI-VENTRICULAR IMPLANTABLE  CARDIOVERTER DEFIBRILLATOR UPGRADE N/A 01/18/2015   Procedure: BI-VENTRICULAR IMPLANTABLE CARDIOVERTER DEFIBRILLATOR UPGRADE;  Surgeon: Evans Lance, MD;  Location: [facility] CATH LAB;  Service: Cardiovascular;  Laterality: N/A;  . CARDIAC CATHETERIZATION    . CARDIAC VALVE REPLACEMENT    . CARDIOVERSION N/A 11/13/2016   Procedure: CARDIOVERSION;  Surgeon: Sanda Klein, MD;  Location: [facility] ENDOSCOPY;  Service: Cardiovascular;  Laterality: N/A;  . COLONOSCOPY  06/12/2007   [place]ourk- Normal rectum/Normal colon  . COLONOSCOPY  12/16/02   small hemorrhoids  . COLONOSCOPY  06/20/2012   Procedure: COLONOSCOPY;  Surgeon: Daneil Dolin, MD;  Location: [facility] ENDO SUITE;  Service: Endoscopy;  Laterality: N/A;  10:45  . defibrillator implanted 2006    . DOPPLER ECHOCARDIOGRAPHY  2012  . DOPPLER ECHOCARDIOGRAPHY  05,06,07,08,09,2011  . ESOPHAGOGASTRODUODENOSCOPY  06/12/2007   Rourk- normal esophagus, small hiatal hernia, otherwise normal stomach, D1, D2  . EYE SURGERY Right 2005  . EYE SURGERY Left 2006  . ICD LEAD REMOVAL Left 02/08/2015   Procedure: ICD LEAD REMOVAL;  Surgeon: Evans Lance, MD;  Location[facility] OR;  Service: Cardiovascular;  Laterality: Left;  "Will plan extraction and insertion of a BiV PM"  **Dr. Roxan Hockey backing up case**  . IMPLANTABLE CARDIOVERTER DEFIBRILLATOR (ICD) GENERATOR CHANGE Left 02/08/2015   Procedure: ICD GENERATOR CHANGE;  Surgeon: Evans Lance, MD;  Location: [facility];  Service: Cardiovascular;  Laterality: Left;  . INSERT / REPLACE / REMOVE PACEMAKER    . PACEMAKER INSERTION  May 2016  . pacemaker placed  2004  . right breast cyst removed     benign   . right cataract removed     2005  . TEE WITHOUT CARDIOVERSION N/A 11/13/2016   Procedure: TRANSESOPHAGEAL ECHOCARDIOGRAM (TEE);  Surgeon: Sanda Klein, MD;  Location: [facility] ENDOSCOPY;  Service: Cardiovascular;  Laterality: N/A;  . THYROIDECTOMY      OB History    Gravida Para Term Preterm AB Living             0   SAB  TAB Ectopic Multiple Live Births                   Home Medications    Prior to Admission medications   Medication Sig Start Date End Date Taking? Authorizing Provider  acetaminophen (TYLENOL) 500 MG tablet Take 500 mg by mouth every 6 (six) hours as needed (pain).   Yes [provider]  albuterol (PROVENTIL) (2.5 MG/3ML) 0.083% nebulizer solution INHALE 1 VIAL VIA NEBULIZER THREE TIMES DAILY. Patient taking differently: INHALE 1 VIAL VIA NEBULIZER THREE TIMES DAILY AS NEEDED 12/28/16  Yes Fayrene Helper, MD  albuterol (VENTOLIN HFA) 108 (90 Base) MCG/ACT inhaler Inhale 2 puffs into the lungs every 6 (six) hours as needed for wheezing or shortness of breath. 10/19/16  Yes Fayrene Helper, MD  allopurinol (ZYLOPRIM) 100 MG tablet Take 1 tablet (100 mg total) by mouth daily. 10/18/17  Yes Fayrene Helper, MD  azelastine (ASTELIN) 0.1 % nasal  spray Place 2 sprays into both nostrils 2 (two) times daily. Use in each nostril as directed Patient taking differently: Place 2 sprays into both nostrils daily as needed for allergies. Use in each nostril as directed 10/04/15  Yes Fayrene Helper, MD  BREO ELLIPTA 100-25 MCG/INH AEPB  10/04/17  Yes [provider]  calcium carbonate (OS-CAL) 1250 (500 Ca) MG chewable tablet Chew 1 tablet by mouth daily. 07/15/10  Yes [provider]  cholecalciferol (VITAMIN D) 1000 UNITS tablet Take 1,000 Units by mouth daily.   Yes [provider]  COUMADIN 5 MG tablet Take 2 tablets daily except 1 tablet on Tuesdays Patient taking differently: Take 1 tablet Tuesday, Thursday, and Saturday and  Take 2 tablets all other days. 09/14/17  Yes Evans Lance, MD  digoxin (LANOXIN) 0.125 MG tablet Take 1 tablet (0.125 mg total) by mouth every other day. 07/23/17  Yes Lendon Colonel, NP  docusate sodium (COLACE) 100 MG capsule Take 300 mg by mouth at bedtime.   Yes [provider]  fluticasone (FLONASE) 50 MCG/ACT  nasal spray USE 2 SPRAYS IN EACH       NOSTRIL ONCE DAILY Patient taking differently: USE 2 SPRAYS IN EACH       NOSTRIL ONCE DAILY AS NEEDED FOR CONGESTION 10/25/16  Yes Fayrene Helper, MD  folic acid (FOLVITE) 1 MG tablet Take 1 tablet (1 mg total) by mouth daily. 05/01/17  Yes Fayrene Helper, MD  furosemide (LASIX) 40 MG tablet Take 1 tablet by mouth  daily 07/23/17  Yes Lendon Colonel, NP  gabapentin (NEURONTIN) 300 MG capsule Take 1 capsule (300 mg total) by mouth at bedtime. 04/16/17  Yes Fayrene Helper, MD  hydrALAZINE (APRESOLINE) 25 MG tablet Take 1 tablet (25 mg total) by mouth 3 (three) times daily. 07/23/17  Yes Lendon Colonel, NP  HYDROcodone-acetaminophen (NORCO) 7.5-325 MG tablet Take 1 tablet by mouth every 6 (six) hours as needed for moderate pain (Must last 30 days.). 10/24/17  Yes Sanjuana Kava, MD  isosorbide mononitrate (IMDUR) 60 MG 24 hr tablet Take 1 tablet (60 mg total) by mouth daily. 07/23/17  Yes Lendon Colonel, NP  losartan (COZAAR) 50 MG tablet take 1 tablet by mouth once daily 06/20/17  Yes Fayrene Helper, MD  meclizine (ANTIVERT) 25 MG tablet Take 1 tablet (25 mg total) by mouth 3 (three) times daily as needed for dizziness. 05/01/17  Yes Fayrene Helper, MD  metolazone (ZAROXOLYN) 2.5 MG tablet TAKE 1 TABLET ONCE A WEEK 07/23/17  Yes Lendon Colonel, NP  ondansetron (ZOFRAN) 4 MG tablet Take 1 tablet (4 mg total) by mouth daily as needed for nausea or vomiting. 05/01/17  Yes Fayrene Helper, MD  oseltamivir (TAMIFLU) 30 MG capsule Take 1 capsule (30 mg total) by mouth 2 (two) times daily. 10/31/17  Yes Sherwood Gambler, MD  Polyethyl [name] (SYSTANE OP) Place 1 drop into both eyes daily as needed (dry eyes).   Yes [provider]  potassium chloride SA (KLOR-CON M20) 20 MEQ tablet Take 1 tablet by mouth  daily 07/23/17  Yes Lendon Colonel, NP  pravastatin (PRAVACHOL) 40 MG tablet Take 1 tablet (40 mg  total) by mouth every evening. 07/23/17 11/01/18 Yes Lendon Colonel, NP  SYNTHROID 75 MCG tablet Take 1 tablet by mouth  daily 10/18/17  Yes Fayrene Helper, MD  TOPROL XL 100 MG 24 hr tablet Take 1 tablet (100 mg total)  by mouth daily. Take with or immediately following a meal. 10/18/17  Yes Fayrene Helper, MD  Darbepoetin Alfa (ARANESP, ALBUMIN FREE, IJ) EVERY THURSDAY 07/15/10   [provider]    Family History Family History  Problem Relation Age of Onset  . Pancreatic cancer Mother   . Heart disease Father   . Heart disease Sister   . Heart attack Sister   . Heart disease Brother   . Diabetes Brother   . Heart disease Brother   . Diabetes Brother   . Hypertension Brother   . Lung cancer Brother     Social History Social History   Tobacco Use  . Smoking status: Former Smoker    Packs/day: 0.75    Years: 40.00    Pack years: 30.00    Types: Cigarettes    Last attempt to quit: 03/08/1987    Years since quitting: 30.6  . Smokeless tobacco: Never Used  Substance Use Topics  . Alcohol use: No    Alcohol/week: 0.0 oz  . Drug use: No     Allergies   Penicillins; Aspirin; Fentanyl; and Niacin   Review of Systems Review of Systems  Constitutional: Positive for fever. Negative for appetite change and fatigue.  HENT: Negative for congestion, ear discharge and sinus pressure.   Eyes: Negative for discharge.  Respiratory: Positive for shortness of breath and wheezing. Negative for cough.   Cardiovascular: Negative for chest pain.  Gastrointestinal: Negative for abdominal pain and diarrhea.  Genitourinary: Negative for frequency and hematuria.  Musculoskeletal: Negative for back pain.  Skin: Negative for rash.  Neurological: Negative for seizures and headaches.  Psychiatric/Behavioral: Negative for hallucinations.     Physical Exam Updated Vital Signs BP 111/83   Pulse 81   Temp 99.1 F (37.3 C) (Oral)   Resp 18   SpO2 100%   Physical Exam   Constitutional: She is oriented to person, place, and time. She appears well-developed.  HENT:  Head: Normocephalic.  Eyes: Conjunctivae and EOM are normal. No scleral icterus.  Neck: Neck supple. No thyromegaly present.  Cardiovascular: Normal rate and regular rhythm. Exam reveals no gallop and no friction rub.  No murmur heard. Pulmonary/Chest: No stridor. She has wheezes. She has no rales. She exhibits no tenderness.  Abdominal: She exhibits no distension. There is no tenderness. There is no rebound.  Musculoskeletal: Normal range of motion. She exhibits no edema.  Lymphadenopathy:    She has no cervical adenopathy.  Neurological: She is oriented to person, place, and time. She exhibits normal muscle tone. Coordination normal.  Skin: No rash noted. No erythema.  Psychiatric: She has a normal mood and affect. Her behavior is normal.     ED Treatments / Results  Labs (all labs ordered are listed, but only abnormal results are displayed) Labs Reviewed  CBC WITH DIFFERENTIAL/PLATELET - Abnormal; Notable for the following components:      Result Value   RBC 3.08 (*)    Hemoglobin 9.2 (*)    HCT 29.0 (*)    RDW 16.3 (*)    All other components within normal limits  COMPREHENSIVE METABOLIC PANEL - Abnormal; Notable for the following components:   Glucose, Bld 105 (*)    BUN 54 (*)    Creatinine, Ser 1.93 (*)    GFR calc non Af Amer 23 (*)    GFR calc Af Amer 27 (*)    All other components within normal limits  BRAIN NATRIURETIC PEPTIDE    EKG  EKG Interpretation None       Radiology Dg Chest 2 View  Result Date: 11/01/2017 CLINICAL DATA:  Shortness of breath, in the emergency department yesterday for the flu, began wheezing and feeling worse today, history of heart block, CHF, COPD, diabetes mellitus, hypertension, non ischemic cardiomyopathy, former smoker EXAM: CHEST  2 VIEW COMPARISON:  10/31/2017 FINDINGS: LEFT subclavian AICD leads appear stable. Enlargement of  cardiac silhouette with minimal pulmonary vascular congestion. Atherosclerotic calcifications aorta. Emphysematous and bronchitic changes consistent with COPD. Interstitial prominence in both lungs slightly greater on RIGHT, increased since previous study question mild pulmonary edema. No segmental consolidation, pleural effusion, or pneumothorax. Bones demineralized. IMPRESSION: Suspected mild pulmonary edema. Electronically Signed   By: Lavonia Dana M.D.   On: 11/01/2017 19:52   Dg Chest 2 View  Result Date: 10/31/2017 CLINICAL DATA:  Cough EXAM: CHEST  2 VIEW COMPARISON:  08/31/2017 FINDINGS: Cardiac enlargement without heart failure. Aortic and mitral valve replacement unchanged in position. Internal cardiac defibrillator leads unchanged from the prior study. Lungs are clear without infiltrate effusion or mass. Atherosclerotic aorta. IMPRESSION: No active cardiopulmonary disease. Electronically Signed   By: Franchot Gallo M.D.   On: 10/31/2017 13:44    Procedures Procedures (including critical care time)  Medications Ordered in ED Medications  acetaminophen (TYLENOL) tablet 650 mg (650 mg Oral Given 11/01/17 1508)  sodium chloride 0.9 % bolus 500 mL (0 mLs Intravenous Stopped 11/01/17 2111)  ipratropium-albuterol (DUONEB) 0.5-2.5 (3) MG/3ML nebulizer solution 3 mL (3 mLs Nebulization Given 11/01/17 1937)  albuterol (PROVENTIL) (2.5 MG/3ML) 0.083% nebulizer solution 2.5 mg (2.5 mg Nebulization Given 11/01/17 1937)  predniSONE (DELTASONE) tablet 60 mg (60 mg Oral Given 11/01/17 2125)     Initial Impression / Assessment and Plan / ED Course  I have reviewed the triage vital signs and the nursing notes.  Pertinent labs & imaging results that were available during my care of the patient were reviewed by me and considered in my medical decision making (see chart for details).     Patient has influenza copd exacerbation.  Patient became hypoxic when she walked around without oxygen her O2 sat dropped to  80.  She will be admitted for the COPD exacerbation  Final Clinical Impressions(s) / ED Diagnoses   Final diagnoses:  Influenza  COPD exacerbation [facility])    ED Discharge Orders    None       Milton Ferguson, MD 11/01/17 2133

## 2017-11-01 NOTE — ED Triage Notes (Signed)
Pt was positive for flu here yesterday, states she is wheezing today

## 2017-11-01 NOTE — ED Notes (Signed)
Placing pt on cardiac monitor

## 2017-11-01 NOTE — ED Notes (Signed)
hospitalist at the bedside 

## 2017-11-01 NOTE — H&P (Signed)
History and Physical    [name] [name] UVO:[id number] DOB: [birth year] DOA: 11/01/2017  PCP: Fayrene Helper, MD  Patient coming from: Home  Chief Complaint: Shortness of breath  HPI: [name] [name] is a 84 y.o. female with medical history significant of CHF, COPD, chronic kidney disease comes in with 2 days of flulike symptoms.  Patient came to the ED yesterday was diagnosed with influenza sent home on Tamiflu.  She went home progressively got worse with more shortness of breath and felt like she was wheezing.  She came to the ED was found to be hypoxic.  She states her fevers have been high..  She has been coughing a lot.  She reports she is been feeling very very weak and awful.  Patient was ambulated in the ED and her O2 sats dropped to 80% on room air.  She does not have a supplemental oxygen requirement.  She denies any lower extremity edema.  She denies any chest pain.  Patient referred for admission for hypoxia due to her influenza.  Review of Systems: As per HPI otherwise 10 point review of systems negative.   Past Medical History:  Diagnosis Date  . Adenomatous polyp of colon 12/16/2002   Dr. [name]/[facility]  . Allergy   . Cataract   . CHB (complete heart block) ([place]) 10/07/2014      . CHF (congestive heart failure) ([place])       . Chronic kidney disease, stage I 2006  . Constipation       . COPD (chronic obstructive pulmonary disease) ([place])       . DJD (degenerative joint disease) of lumbar spine   . DM (diabetes mellitus) ([place]) 2006  . Esophageal reflux   . Glaucoma   . Gout       . Heart murmur   . Hyperlipemia   . IDA (iron deficiency anemia)    Parenteral iron/[name]  . Insomnia       . Non-ischemic cardiomyopathy ([place])    a. s/p MDT CRTD  . Obesity, unspecified   . Other and unspecified hyperlipidemia   . Oxygen deficiency 2014   nocturnal;  . Spondylolisthesis   . Spondylosis   . Thyroid cancer ([place])    remote  thyroidectomy, no recurrence, pt denies in 2016 thyroid cancer  . Unspecified hypothyroidism       . 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 Vertigo         Past Surgical History:  Procedure Laterality Date  . AORTIC AND MITRAL VALVE REPLACEMENT  2001  . BI-VENTRICULAR IMPLANTABLE CARDIOVERTER DEFIBRILLATOR UPGRADE N/A 01/18/2015   Procedure: BI-VENTRICULAR IMPLANTABLE CARDIOVERTER DEFIBRILLATOR UPGRADE;  Surgeon: Evans Lance, MD;  Location: [facility] CATH LAB;  Service: Cardiovascular;  Laterality: N/A;  . CARDIAC CATHETERIZATION    . CARDIAC VALVE REPLACEMENT    . CARDIOVERSION N/A 11/13/2016   Procedure: CARDIOVERSION;  Surgeon: Sanda Klein, MD;  Location: [facility] ENDOSCOPY;  Service: Cardiovascular;  Laterality: N/A;  . COLONOSCOPY  06/12/2007   Rourk- Normal rectum/Normal colon  . COLONOSCOPY  12/16/02   small hemorrhoids  . COLONOSCOPY  06/20/2012   Procedure: COLONOSCOPY;  Surgeon: Daneil Dolin, MD;  Location: [facility] ENDO SUITE;  Service: Endoscopy;  Laterality: N/A;  10:45  . defibrillator implanted 2006    . DOPPLER ECHOCARDIOGRAPHY  2012  . DOPPLER ECHOCARDIOGRAPHY  05,06,07,08,09,2011  . ESOPHAGOGASTRODUODENOSCOPY  06/12/2007   Rourk- normal esophagus, small hiatal hernia, otherwise normal stomach,  D1, D2  . EYE SURGERY Right 2005  . EYE SURGERY Left 2006  . ICD LEAD REMOVAL Left 02/08/2015   Procedure: ICD LEAD REMOVAL;  Surgeon: Evans Lance, MD;  Location: [facility] OR;  Service: Cardiovascular;  Laterality: Left;  "Will plan extraction and insertion of a BiV PM"  **Dr. Roxan Hockey backing up case**  . IMPLANTABLE CARDIOVERTER DEFIBRILLATOR (ICD) GENERATOR CHANGE Left 02/08/2015   Procedure: ICD GENERATOR CHANGE;  Surgeon: Evans Lance, MD;  Location: [place];  Service: Cardiovascular;  Laterality: Left;  . INSERT / REPLACE / REMOVE PACEMAKER    . PACEMAKER INSERTION  May 2016  . pacemaker placed  2004  . right breast cyst removed     benign   . right  cataract removed     2005  . TEE WITHOUT CARDIOVERSION N/A 11/13/2016   Procedure: TRANSESOPHAGEAL ECHOCARDIOGRAM (TEE);  Surgeon: Sanda Klein, MD;  Location: [facility] ENDOSCOPY;  Service: Cardiovascular;  Laterality: N/A;  . THYROIDECTOMY       reports that she quit smoking about 30 years ago. Her smoking use included cigarettes. She has a 30.00 pack-year smoking history. she has never used smokeless tobacco. She reports that she does not drink alcohol or use drugs.  Allergies  Allergen Reactions  . Penicillins Other (See Comments)    Bruise Has patient had a PCN reaction causing immediate rash, facial/tongue/throat swelling, SOB or lightheadedness with hypotension: no Has patient had a PCN reaction causing severe rash involving mucus membranes or skin necrosis: no Has patient had a PCN reaction that required hospitalization: no Has patient had a PCN reaction occurring within the last 10 years: no If all of the above answers are "NO", then may proceed with Cephalosporin use.   . Aspirin Other (See Comments)    On coumadin  . Fentanyl Dermatitis    Rash with patch   . Niacin Itching    Family History  Problem Relation Age of Onset  . Pancreatic cancer Mother   . Heart disease Father   . Heart disease Sister   . Heart attack Sister   . Heart disease Brother   . Diabetes Brother   . Heart disease Brother   . Diabetes Brother   . Hypertension Brother   . Lung cancer Brother     Prior to Admission medications   Medication Sig Start Date End Date Taking? Authorizing Provider  acetaminophen (TYLENOL) 500 MG tablet Take 500 mg by mouth every 6 (six) hours as needed (pain).   Yes [provider]  albuterol (PROVENTIL) (2.5 MG/3ML) 0.083% nebulizer solution INHALE 1 VIAL VIA NEBULIZER THREE TIMES DAILY. Patient taking differently: INHALE 1 VIAL VIA NEBULIZER THREE TIMES DAILY AS NEEDED 12/28/16  Yes Fayrene Helper, MD  albuterol (VENTOLIN HFA) 108 (90 Base) MCG/ACT  inhaler Inhale 2 puffs into the lungs every 6 (six) hours as needed for wheezing or shortness of breath. 10/19/16  Yes Fayrene Helper, MD  allopurinol (ZYLOPRIM) 100 MG tablet Take 1 tablet (100 mg total) by mouth daily. 10/18/17  Yes Fayrene Helper, MD  azelastine (ASTELIN) 0.1 % nasal spray Place 2 sprays into both nostrils 2 (two) times daily. Use in each nostril as directed Patient taking differently: Place 2 sprays into both nostrils daily as needed for allergies. Use in each nostril as directed 10/04/15  Yes Fayrene Helper, MD  BREO ELLIPTA 100-25 MCG/INH AEPB  10/04/17  Yes [provider]  calcium carbonate (OS-CAL) 1250 (500 Ca) MG chewable tablet  Chew 1 tablet by mouth daily. 07/15/10  Yes [provider]  cholecalciferol (VITAMIN D) 1000 UNITS tablet Take 1,000 Units by mouth daily.   Yes [provider]  COUMADIN 5 MG tablet Take 2 tablets daily except 1 tablet on Tuesdays Patient taking differently: Take 1 tablet Tuesday, Thursday, and Saturday and  Take 2 tablets all other days. 09/14/17  Yes Evans Lance, MD  digoxin (LANOXIN) 0.125 MG tablet Take 1 tablet (0.125 mg total) by mouth every other day. 07/23/17  Yes Lendon Colonel, NP  docusate sodium (COLACE) 100 MG capsule Take 300 mg by mouth at bedtime.   Yes [provider]  fluticasone (FLONASE) 50 MCG/ACT nasal spray USE 2 SPRAYS IN EACH       NOSTRIL ONCE DAILY Patient taking differently: USE 2 SPRAYS IN EACH       NOSTRIL ONCE DAILY AS NEEDED FOR CONGESTION 10/25/16  Yes Fayrene Helper, MD  folic acid (FOLVITE) 1 MG tablet Take 1 tablet (1 mg total) by mouth daily. 05/01/17  Yes Fayrene Helper, MD  furosemide (LASIX) 40 MG tablet Take 1 tablet by mouth  daily 07/23/17  Yes Lendon Colonel, NP  gabapentin (NEURONTIN) 300 MG capsule Take 1 capsule (300 mg total) by mouth at bedtime. 04/16/17  Yes Fayrene Helper, MD  hydrALAZINE (APRESOLINE) 25 MG tablet Take 1  tablet (25 mg total) by mouth 3 (three) times daily. 07/23/17  Yes Lendon Colonel, NP  HYDROcodone-acetaminophen (NORCO) 7.5-325 MG tablet Take 1 tablet by mouth every 6 (six) hours as needed for moderate pain (Must last 30 days.). 10/24/17  Yes Sanjuana Kava, MD  isosorbide mononitrate (IMDUR) 60 MG 24 hr tablet Take 1 tablet (60 mg total) by mouth daily. 07/23/17  Yes Lendon Colonel, NP  losartan (COZAAR) 50 MG tablet take 1 tablet by mouth once daily 06/20/17  Yes Fayrene Helper, MD  meclizine (ANTIVERT) 25 MG tablet Take 1 tablet (25 mg total) by mouth 3 (three) times daily as needed for dizziness. 05/01/17  Yes Fayrene Helper, MD  metolazone (ZAROXOLYN) 2.5 MG tablet TAKE 1 TABLET ONCE A WEEK 07/23/17  Yes Lendon Colonel, NP  ondansetron (ZOFRAN) 4 MG tablet Take 1 tablet (4 mg total) by mouth daily as needed for nausea or vomiting. 05/01/17  Yes Fayrene Helper, MD  oseltamivir (TAMIFLU) 30 MG capsule Take 1 capsule (30 mg total) by mouth 2 (two) times daily. 10/31/17  Yes Sherwood Gambler, MD  Polyethyl [name] (SYSTANE OP) Place 1 drop into both eyes daily as needed (dry eyes).   Yes [provider]  potassium chloride SA (KLOR-CON M20) 20 MEQ tablet Take 1 tablet by mouth  daily 07/23/17  Yes Lendon Colonel, NP  pravastatin (PRAVACHOL) 40 MG tablet Take 1 tablet (40 mg total) by mouth every evening. 07/23/17 11/01/18 Yes Lendon Colonel, NP  SYNTHROID 75 MCG tablet Take 1 tablet by mouth  daily 10/18/17  Yes Fayrene Helper, MD  TOPROL XL 100 MG 24 hr tablet Take 1 tablet (100 mg total) by mouth daily. Take with or immediately following a meal. 10/18/17  Yes Fayrene Helper, MD  Darbepoetin Alfa (ARANESP, ALBUMIN FREE, IJ) EVERY THURSDAY 07/15/10   [provider]    Physical Exam: Vitals:   11/01/17 1929 11/01/17 1937 11/01/17 2120 11/01/17 2130  BP:   111/83 (!) 146/64  Pulse:   81 74  Resp:    (!) 27  Temp: 99.1 F  (37.3 C)     TempSrc: Oral     SpO2:  92% 100% 98%      Constitutional: NAD, calm, comfortable speaking in full sentences without difficulty Vitals:   11/01/17 1929 11/01/17 1937 11/01/17 2120 11/01/17 2130  BP:   111/83 (!) 146/64  Pulse:   81 74  Resp:    (!) 27  Temp: 99.1 F (37.3 C)     TempSrc: Oral     SpO2:  92% 100% 98%   Eyes: PERRL, lids and conjunctivae normal ENMT: Mucous membranes are moist. Posterior pharynx clear of any exudate or lesions.Normal dentition.  Neck: normal, supple, no masses, no thyromegaly Respiratory: clear to auscultation bilaterally, no wheezing, no crackles. Normal respiratory effort. No accessory muscle use.  Cardiovascular: Regular rate and rhythm, no murmurs / rubs / gallops. No extremity edema. 2+ pedal pulses. No carotid bruits.  Abdomen: no tenderness, no masses palpated. No hepatosplenomegaly. Bowel sounds positive.  Musculoskeletal: no clubbing / cyanosis. No joint deformity upper and lower extremities. Good ROM, no contractures. Normal muscle tone.  Skin: no rashes, lesions, ulcers. No induration Neurologic: CN 2-12 grossly intact. Sensation intact, DTR normal. Strength 5/5 in all 4.  Psychiatric: Normal judgment and insight. Alert and oriented x 3. Normal mood.    Labs on Admission: I have personally reviewed following labs and imaging studies  CBC: Recent Labs  Lab 10/31/17 1408 11/01/17 1901  WBC 5.6 6.0  NEUTROABS 4.3 4.0  HGB 10.2* 9.2*  HCT 32.6* 29.0*  MCV 96.2 94.2  PLT 157 672   Basic Metabolic Panel: Recent Labs  Lab 10/31/17 1408 11/01/17 1901  NA 142 137  K 4.5 3.9  CL 103 102  CO2 26 23  GLUCOSE 114* 105*  BUN 48* 54*  CREATININE 2.03* 1.93*  CALCIUM 9.5 9.1   GFR: Estimated Creatinine Clearance: 28.2 mL/min (A) (by C-G formula based on SCr of 1.93 mg/dL (H)). Liver Function Tests: Recent Labs  Lab 10/31/17 1408 11/01/17 1901  AST 30 31  ALT 16 16  ALKPHOS 48 41  BILITOT 0.7 0.9  PROT 8.3*  7.3  ALBUMIN 4.1 3.7   No results for input(s): LIPASE, AMYLASE in the last 168 hours. No results for input(s): AMMONIA in the last 168 hours. Coagulation Profile: Recent Labs  Lab 10/31/17 1436  INR 2.86   Cardiac Enzymes: No results for input(s): CKTOTAL, CKMB, CKMBINDEX, TROPONINI in the last 168 hours. BNP (last 3 results) No results for input(s): PROBNP in the last 8760 hours. HbA1C: No results for input(s): HGBA1C in the last 72 hours. CBG: Recent Labs  Lab 10/31/17 1321  GLUCAP 91   Lipid Profile: No results for input(s): CHOL, HDL, LDLCALC, TRIG, CHOLHDL, LDLDIRECT in the last 72 hours. Thyroid Function Tests: No results for input(s): TSH, T4TOTAL, FREET4, T3FREE, THYROIDAB in the last 72 hours. Anemia Panel: No results for input(s): VITAMINB12, FOLATE, FERRITIN, TIBC, IRON, RETICCTPCT in the last 72 hours. Urine analysis:    Component Value Date/Time   COLORURINE YELLOW 08/31/2017 1930   APPEARANCEUR CLEAR 08/31/2017 1930   LABSPEC 1.013 08/31/2017 1930   PHURINE 5.0 08/31/2017 1930   GLUCOSEU NEGATIVE 08/31/2017 1930   GLUCOSEU NEG mg/dL 03/22/2010 2313   HGBUR NEGATIVE 08/31/2017 1930   HGBUR negative 05/27/2010 1122   BILIRUBINUR NEGATIVE 08/31/2017 1930   BILIRUBINUR neg 03/11/2013 1443   [place] 08/31/2017 1930   PROTEINUR NEGATIVE 08/31/2017 1930   UROBILINOGEN 0.2 03/11/2013 1443   UROBILINOGEN  0.2 03/27/2012 1201   NITRITE NEGATIVE 08/31/2017 1930   LEUKOCYTESUR NEGATIVE 08/31/2017 1930   Sepsis Labs: !!!!!!!!!!!!!!!!!!!!!!!!!!!!!!!!!!!!!!!!!!!! @LABRCNTIP (procalcitonin:4,lacticidven:4) )No results found for this or any previous visit (from the past 240 hour(s)).   Radiological Exams on Admission: Dg Chest 2 View  Result Date: 11/01/2017 CLINICAL DATA:  Shortness of breath, in the emergency department yesterday for the flu, began wheezing and feeling worse today, history of heart block, CHF, COPD, diabetes mellitus, hypertension, non  ischemic cardiomyopathy, former smoker EXAM: CHEST  2 VIEW COMPARISON:  10/31/2017 FINDINGS: LEFT subclavian AICD leads appear stable. Enlargement of cardiac silhouette with minimal pulmonary vascular congestion. Atherosclerotic calcifications aorta. Emphysematous and bronchitic changes consistent with COPD. Interstitial prominence in both lungs slightly greater on RIGHT, increased since previous study question mild pulmonary edema. No segmental consolidation, pleural effusion, or pneumothorax. Bones demineralized. IMPRESSION: Suspected mild pulmonary edema. Electronically Signed   By: Lavonia Dana M.D.   On: 11/01/2017 19:52   Dg Chest 2 View  Result Date: 10/31/2017 CLINICAL DATA:  Cough EXAM: CHEST  2 VIEW COMPARISON:  08/31/2017 FINDINGS: Cardiac enlargement without heart failure. Aortic and mitral valve replacement unchanged in position. Internal cardiac defibrillator leads unchanged from the prior study. Lungs are clear without infiltrate effusion or mass. Atherosclerotic aorta. IMPRESSION: No active cardiopulmonary disease. Electronically Signed   By: Franchot Gallo M.D.   On: 10/31/2017 13:44    Chest x-ray reviewed no edema or infiltrate Old chart reviewed Case discussed with EDP  Assessment/Plan 84 year old female with acute hypoxic respiratory failure secondary to influenza  Principal Problem:   Influenza-continue Tamiflu.  Continue supportive care.  Continue with supplemental oxygen to keep O2 above 88%  Active Problems:   Acute respiratory failure with hypoxia (HCC)-supplemental oxygen as above not in any current respiratory distress    Essential hypertension-continue home meds    Cardiorenal syndrome with renal failure-no evidence of this at this time renal failure at baseline creatinine of 2    AICD (automatic cardioverter/defibrillator) present-noted    CKD (chronic kidney disease), stage IV (HCC) stable creatinine at 2-    Chronic anticoagulation, on coumadin for Mechanical  AVR and MVR-on Coumadin consult pharmacy    COPD (chronic obstructive pulmonary disease) (HCC)-no wheezing on my exam right now.  Continue bronchodilators no steroids at this time    Chronic systolic heart failure (HCC)-this is stable at this time continue home Lasix dosing     DVT prophylaxis: Coumadin Code Status: Full Family Communication: Sister Disposition Plan: Per day team Consults called: Pharmacy for Coumadin Admission status: Observation   Octivia Canion A MD [facility]  If 7PM-7AM, please contact night-coverage [email or web address] Password [facility]  11/01/2017, 9:53 PM

## 2017-11-01 NOTE — Patient Outreach (Signed)
[name] [name]) Care Management  [place]  09/27/2017   [name] [birth year] [id number]   [name] [name] is an 84 y.o. 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  [name] [name] CM referral received from [name] CM in April 2018 to continue to follow for [name] [name] and home visit for May 2018. She had been hospitalized 2/22-2/23/18 for exacerbation CHF and was previously active with [name] RN health coach, [name] [name] [name] has not been hospitalized in the last 6 months and is doing well with home care management She is now being followed for [facility] CM complex nursing services  Last hospitalization was in February 2018- 11 months ago and only 2 ED visit on June 20, 2017  for weakness and sob- resolved with breathing treatment, antibiotics, steroids plus 09/10/17 for  for palpitations but the after summary discharge instructions indicate no found issues with Afib nor CHF.   Subjective: " see notes below  Objective:  Telephone assessment completed  Encounter Medications:  Outpatient Encounter Medications as of 09/27/2017  Medication Sig Note  . acetaminophen (TYLENOL) 500 MG tablet Take 500 mg by mouth every 6 (six) hours as needed (pain).   Marland Kitchen albuterol (PROVENTIL) (2.5 MG/3ML) 0.083% nebulizer solution INHALE 1 VIAL VIA NEBULIZER THREE TIMES DAILY. (Patient taking differently: INHALE 1 VIAL VIA NEBULIZER THREE TIMES DAILY AS NEEDED)   . albuterol (VENTOLIN HFA) 108 (90 Base) MCG/ACT inhaler Inhale 2 puffs into the lungs every 6 (six) hours as needed for wheezing or shortness of breath.   Marland Kitchen azelastine (ASTELIN) 0.1 % nasal spray Place 2 sprays into both nostrils 2 (two) times daily. Use in each nostril as directed (Patient taking  differently: Place 2 sprays into both nostrils daily as needed for allergies. Use in each nostril as directed)   . calcitRIOL (ROCALTROL) 0.25 MCG capsule Take 0.25 mcg by mouth daily.  08/02/2017: Last filled 07/19/17 with instructions to take mondays, wednesdays and fridays 1 tab  . calcium carbonate (OS-CAL) 1250 (500 Ca) MG chewable tablet Chew 1 tablet by mouth daily.   . cholecalciferol (VITAMIN D) 1000 UNITS tablet Take 1,000 Units by mouth daily.   Marland Kitchen COUMADIN 5 MG tablet Take 2 tablets daily except 1 tablet on Tuesdays (Patient taking differently: Take 1 tablet Tuesday, Thursday, and Saturday and  Take 2 tablets all other days.)   . digoxin (LANOXIN) 0.125 MG tablet Take 1 tablet (0.125 mg total) by mouth every other day.   . docusate sodium (COLACE) 100 MG capsule Take 300 mg by mouth at bedtime.   . fluticasone (FLONASE) 50 MCG/ACT nasal spray USE 2 SPRAYS IN EACH       NOSTRIL ONCE DAILY (Patient taking differently: USE 2 SPRAYS IN EACH       NOSTRIL ONCE DAILY AS NEEDED FOR CONGESTION)   . folic acid (FOLVITE) 1 MG tablet Take 1 tablet (1 mg total) by mouth daily.   . furosemide (LASIX) 40 MG tablet Take 1 tablet by mouth  daily   . gabapentin (NEURONTIN) 300 MG capsule Take 1 capsule (300 mg total) by mouth at bedtime.   . hydrALAZINE (APRESOLINE) 25 MG tablet Take 1 tablet (25 mg total) by mouth 3 (three) times daily.   . isosorbide mononitrate (IMDUR) 60 MG 24 hr tablet Take  1 tablet (60 mg total) by mouth daily.   Marland Kitchen losartan (COZAAR) 50 MG tablet take 1 tablet by mouth once daily   . meclizine (ANTIVERT) 25 MG tablet Take 1 tablet (25 mg total) by mouth 3 (three) times daily as needed for dizziness.   . metolazone (ZAROXOLYN) 2.5 MG tablet TAKE 1 TABLET ONCE A WEEK   . ondansetron (ZOFRAN) 4 MG tablet Take 1 tablet (4 mg total) by mouth daily as needed for nausea or vomiting.   [name] [name] (SYSTANE OP) Place 1 drop into both eyes daily as needed (dry eyes).   .  potassium chloride SA (KLOR-CON M20) 20 MEQ tablet Take 1 tablet by mouth  daily   . pravastatin (PRAVACHOL) 40 MG tablet Take 1 tablet (40 mg total) by mouth every evening.   . promethazine-dextromethorphan (PROMETHAZINE-DM) 6.25-15 MG/5ML syrup One teaspoon at bedtime, as needed, for excessive cough (Patient taking differently: Take 5 mLs by mouth at bedtime as needed for cough. as needed, for excessive cough)   . UNABLE TO FIND [name] with seat x 1   . [DISCONTINUED] allopurinol (ZYLOPRIM) 100 MG tablet Take 1 tablet (100 mg total) by mouth daily.   . [DISCONTINUED] HYDROcodone-acetaminophen (NORCO) 7.5-325 MG tablet Take 1 tablet by mouth every 6 (six) hours as needed for moderate pain (Must last 30 days.Do not drive or operate machinery while taking this medicine.).   . [DISCONTINUED] SYNTHROID 75 MCG tablet Take 1 tablet by mouth  daily   . [DISCONTINUED] TOPROL XL 100 MG 24 hr tablet Take 1 tablet (100 mg total) by mouth daily. Take with or immediately following a meal.    Facility-Administered Encounter Medications as of 09/27/2017  Medication  . epoetin alfa (EPOGEN,PROCRIT) injection 40,000 Units    Functional Status:  In your present state of health, do you have any difficulty performing the following activities: 04/26/2017 03/15/2017  Hearing? N N  Vision? N N  Difficulty concentrating or making decisions? N N  Walking or climbing stairs? N N  Comment - -  Dressing or bathing? N N  Doing errands, shopping? N [place] and eating ? N N  Using the Toilet? N N  In the past six months, have you accidently leaked urine? N N  Do you have problems with loss of bowel control? N N  Managing your Medications? N N  Managing your Finances? N N  Housekeeping or managing your Housekeeping? N N  Some recent data might be hidden    Fall/Depression Screening: Fall Risk  06/28/2017 04/26/2017 03/15/2017  Falls in the past year? No No No  Number falls in past yr: - - -   Injury with Fall? - - -  Risk for fall due to : - Impaired balance/gait;Impaired mobility;Medication side effect Impaired mobility;Medication side effect;Impaired balance/gait  Follow up - - -   PHQ 2/9 Scores 06/28/2017 04/26/2017 03/20/2017 03/15/2017 03/12/2017 11/30/2016 11/03/2016  PHQ - 2 Score 0 0 0 0 0 1 0  PHQ- 9 Score - - - - - - -    Assessment:   CM contacted [name] Malan for telephonic assessment call via telephone    COPD has oxygen for use at home Reports mostly used at night but has recently questioned CM if she needed it at all (related not being able to use at home during inclement weather episode without noted distress)   DM/HTN- 06/28/17 hgA1c = 5.9 DM and BP maintained with medications,  diet and monitored by Dr [name] every 3-6 months  DDD- Continues with pain of joints, hip and back at intervals Seen by Dr Luna Glasgow for this. She received Hydrocodone from Dr Luna Glasgow for pain She has had several episodes of physical therapy sessions in 2018.(seen by "Anne Ng" of advanced home care. She reports her recent session is ending and she was told if she later needed more PT services to call Dr [name] to see if available " I just continue to have hip pain" She reports she is no longer getting much relief "from the shots and I have been told it is bursitis." She reports she is interested in seeing an orthopedic provider in [place] versus [place]. Cm discussed use of a medical release form as needed to transfer orthopedic MD services to another orthopedic MD. She voiced understanding She continues to use her Rolator without issues  CHF/Afib- She had a visit to the ED in December 2018 for palpitations but the after summary discharge instructions indicate no found issues with Afib nor CHF. She was told by ED MD to follow up with her cardiologist and Action plans. No change made in treatment plans. Her pacemaker was remotely checked on 09/26/17 and found to be without issues. She has been  educated on CHF/Afib action plans and told to keep legs up to decrease edema on every home visit.   Plan: Reviewing case for possible health coach transfer or case closure discussed with [name] Smarr as her medical issues are managed at home and continues outpatient management of orthopedic issues with providers After review of Epic Cm noted [name] Bonafede is not meeting criteria to be followed by [place] [name] health coach.  DM, HF, Afib and COPD are controlled  Health Coaches generally only follow DM, COPD, Heart Failure and A- FIB (HTA patients only).  To send case closure letters to [name] Hazell and Dr [name]. Route note to epic team members  [facility] CM Care Plan Problem One     Most Recent Value  Care Plan Problem One  Heart Failure knowledge deficit  Role Documenting the Problem One  Care Management [place] for Problem One  Active  [facility] Long Term Goal   Patient will become familiar with heart failure disease process and be able to verbalize heart failure zones for self management wtihin 90 days.    [facility] Long Term Goal Start Date  01/15/17 [goal continued]  [facility] Long Term Goal Met Date  03/15/17  Interventions for Problem One Long Term Goal  RN CM reinforced CHF action plan, importance of working with home health and continuing to do exercises, energy conservation  [facility] CM Short Term Goal #1   knowledge deficit related to low sodium diet  [facility] CM Short Term Goal #1 Start Date  01/15/17  [name] CM Short Term Goal #1 Met Date  03/15/17  Interventions for Short Term Goal #1  RN CM reiterated foods high in sodium to limit, avoid, reviewed correlation to high sodium diet and fluid retention.  [facility] CM Short Term Goal #2   Decreased endurance  [facility] CM Short Term Goal #2 Start Date  12/25/16  [name] CM Short Term Goal #2 Met Date  01/15/17  Interventions for Short Term Goal #2  RN CM ask pt to walk several times daily, short intervals throughout her house        Loraine L. Lavina [name], RN, BSN, [place] Coordinator [phone] week day mobile

## 2017-11-01 NOTE — Progress Notes (Signed)
ANTICOAGULATION CONSULT NOTE - Initial Consult  Pharmacy Consult for Warfarin Indication: Mechanical AVR and MVR  Allergies  Allergen Reactions  . Penicillins Other (See Comments)    Bruise Has patient had a PCN reaction causing immediate rash, facial/tongue/throat swelling, SOB or lightheadedness with hypotension: no Has patient had a PCN reaction causing severe rash involving mucus membranes or skin necrosis: no Has patient had a PCN reaction that required hospitalization: no Has patient had a PCN reaction occurring within the last 10 years: no If all of the above answers are "NO", then may proceed with Cephalosporin use.   . Aspirin Other (See Comments)    On coumadin  . Fentanyl Dermatitis    Rash with patch   . Niacin Itching    Patient Measurements:   Heparin Dosing Weight:   Vital Signs: Temp: 99.1 F (37.3 C) (02/07 1929) Temp Source: Oral (02/07 1929) BP: 146/64 (02/07 2130) Pulse Rate: 74 (02/07 2130)  Labs: Recent Labs    10/31/17 1408 10/31/17 1436 11/01/17 1901  HGB 10.2*  --  9.2*  HCT 32.6*  --  29.0*  PLT 157  --  162  LABPROT  --  29.7*  --   INR  --  2.86  --   CREATININE 2.03*  --  1.93*    Estimated Creatinine Clearance: 28.2 mL/min (A) (by C-G formula based on SCr of 1.93 mg/dL (H)).   Medical History: Past Medical History:  Diagnosis Date  . Adenomatous polyp of colon 12/16/2002   Dr. [name]/[facility]  . Allergy   . Cataract   . CHB (complete heart block) ([place]) 10/07/2014      . CHF (congestive heart failure) ([place])       . Chronic kidney disease, stage I 2006  . Constipation       . COPD (chronic obstructive pulmonary disease) ([place])       . DJD (degenerative joint disease) of lumbar spine   . DM (diabetes mellitus) ([place]) 2006  . Esophageal reflux   . Glaucoma   . Gout       . Heart murmur   . Hyperlipemia   . IDA (iron deficiency anemia)    Parenteral iron/[name]  . Insomnia     . Non-ischemic cardiomyopathy ([place])    a. s/p MDT CRTD  . Obesity, unspecified   . Other and unspecified hyperlipidemia   . Oxygen deficiency 2014   nocturnal;  . Spondylolisthesis   . Spondylosis   . Thyroid cancer ([place])    remote thyroidectomy, no recurrence, pt denies in 2016 thyroid cancer  . Unspecified hypothyroidism       . 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 Vertigo         Medications:   (Not in a hospital admission)  Home Warfarin = 5mg  Tuesday, Thursday and Saturday, 10mg  Monday, Wednesday, Friday and Sunday per home med list.  Assessment: Okay for Protocol, INR slightly above 10/31/2017.  Goal of Therapy:  INR 2-2.5 per home anticoag flow sheet.  INR goal reduced 04/2017 due to bruising per chart notes.  Plan:  Warfarin 1mg  PO x 1 Daily PT/INR  Monitor for signs and symptoms of bleeding.   Pricilla Larsson 11/01/2017,10:02 PM

## 2017-11-01 NOTE — ED Notes (Signed)
Pt ambulatory to the bathroom, 96% on room air, after using bathroom, pt was wheezing, 02 sat was 80%, placed on 4 L [place], after sitting for 2 minutes pt is 100%, decreased to 2 L

## 2017-11-01 NOTE — ED Notes (Signed)
Going to xray 

## 2017-11-02 DIAGNOSIS — J111 Influenza due to unidentified influenza virus with other respiratory manifestations: Principal | ICD-10-CM

## 2017-11-02 DIAGNOSIS — J9601 Acute respiratory failure with hypoxia: Secondary | ICD-10-CM

## 2017-11-02 LAB — CBC
HCT: 31.2 % — ABNORMAL LOW (ref 36.0–46.0)
Hemoglobin: 9.7 g/dL — ABNORMAL LOW (ref 12.0–15.0)
MCH: 29.5 pg (ref 26.0–34.0)
MCHC: 31.1 g/dL (ref 30.0–36.0)
MCV: 94.8 fL (ref 78.0–100.0)
Platelets: 157 10*3/uL (ref 150–400)
RBC: 3.29 MIL/uL — ABNORMAL LOW (ref 3.87–5.11)
RDW: 16.1 % — ABNORMAL HIGH (ref 11.5–15.5)
WBC: 4.2 10*3/uL (ref 4.0–10.5)

## 2017-11-02 LAB — BASIC METABOLIC PANEL
Anion gap: 13 (ref 5–15)
BUN: 55 mg/dL — ABNORMAL HIGH (ref 6–20)
CO2: 25 mmol/L (ref 22–32)
Calcium: 9.2 mg/dL (ref 8.9–10.3)
Chloride: 101 mmol/L (ref 101–111)
Creatinine, Ser: 1.74 mg/dL — ABNORMAL HIGH (ref 0.44–1.00)
GFR calc Af Amer: 30 mL/min — ABNORMAL LOW (ref >60)
GFR calc non Af Amer: 26 mL/min — ABNORMAL LOW (ref >60)
Glucose, Bld: 170 mg/dL — ABNORMAL HIGH (ref 65–99)
Potassium: 4.2 mmol/L (ref 3.5–5.1)
Sodium: 139 mmol/L (ref 135–145)

## 2017-11-02 LAB — PROTIME-INR
INR: 2.04
Prothrombin Time: 22.8 seconds — ABNORMAL HIGH (ref 11.4–15.2)

## 2017-11-02 MED ORDER — WARFARIN SODIUM 5 MG PO TABS
10.0000 mg | ORAL_TABLET | Freq: Once | ORAL | Status: AC
  Administered 2017-11-02: 10 mg via ORAL
  Filled 2017-11-02: qty 2

## 2017-11-02 NOTE — Discharge Summary (Signed)
Physician Discharge Summary  [name] [name] XBM:[id number] DOB: November 17, 1931 DOA: 11/01/2017  PCP: Fayrene Helper, MD  Admit date: 11/01/2017 Discharge date: 11/02/2017  Time spent: 45 minutes  Recommendations for Outpatient Follow-up:  -Will be discharged home today. -Advised to follow-up with primary care provider in 2 weeks.  Discharge Diagnoses:  Principal Problem:   Influenza Active Problems:   Essential hypertension   Acute respiratory failure with hypoxia (HCC)   Cardiorenal syndrome with renal failure   AICD (automatic cardioverter/defibrillator) present   CKD (chronic kidney disease), stage IV (HCC)   Chronic anticoagulation, on coumadin for Mechanical AVR and MVR   COPD (chronic obstructive pulmonary disease) ([place])   Chronic systolic heart failure ([place])   Discharge Condition: Stable and improved  Filed Weights   11/01/17 2323  Weight: 99 kg (218 lb 4.8 oz)    History of present illness:  As per Dr. Shanon Brow on 2/7: [name] [name] is a 84 y.o. female with medical history significant of CHF, COPD, chronic kidney disease comes in with 2 days of flulike symptoms.  Patient came to the ED yesterday was diagnosed with influenza sent home on Tamiflu.  She went home progressively got worse with more shortness of breath and felt like she was wheezing.  She came to the ED was found to be hypoxic.  She states her fevers have been high..  She has been coughing a lot.  She reports she is been feeling very very weak and awful.  Patient was ambulated in the ED and her O2 sats dropped to 80% on room air.  She does not have a supplemental oxygen requirement.  She denies any lower extremity edema.  She denies any chest pain.  Patient referred for admission for hypoxia due to her influenza.    Hospital Course:   Acute hypoxemic respiratory failure -Due to influenza A.  This was diagnosed at her PCPs office on 2/6.  She was already on Tamiflu by the time she presented to the  hospital. -Reportedly her sats were 80% on room air on admission, she is now on room air with sats in the mid to upper 90s. -Okay for discharge home today to complete Tamiflu treatment at home.  Rest of medical conditions have been stable and home medications have not been changed while in the hospital.    Procedures:  None  Consultations:  None  Discharge Instructions  Discharge Instructions    Diet - low sodium heart healthy   Complete by:  As directed    Increase activity slowly   Complete by:  As directed      Allergies as of 11/02/2017      Reactions   Penicillins Other (See Comments)   Bruise Has patient had a PCN reaction causing immediate rash, facial/tongue/throat swelling, SOB or lightheadedness with hypotension: no Has patient had a PCN reaction causing severe rash involving mucus membranes or skin necrosis: no Has patient had a PCN reaction that required hospitalization: no Has patient had a PCN reaction occurring within the last 10 years: no If all of the above answers are "NO", then may proceed with Cephalosporin use.   Aspirin Other (See Comments)   On coumadin   Fentanyl Dermatitis   Rash with patch   Niacin Itching      Medication List    TAKE these medications   acetaminophen 500 MG tablet Commonly known as:  TYLENOL Take 500 mg by mouth every 6 (six) hours as needed (pain).   albuterol  108 (90 Base) MCG/ACT inhaler Commonly known as:  VENTOLIN HFA Inhale 2 puffs into the lungs every 6 (six) hours as needed for wheezing or shortness of breath. What changed:  Another medication with the same name was changed. Make sure you understand how and when to take each.   albuterol (2.5 MG/3ML) 0.083% nebulizer solution Commonly known as:  PROVENTIL INHALE 1 VIAL VIA NEBULIZER THREE TIMES DAILY. What changed:  See the new instructions.   allopurinol 100 MG tablet Commonly known as:  ZYLOPRIM Take 1 tablet (100 mg total) by mouth daily.   ARANESP  (ALBUMIN FREE) IJ EVERY THURSDAY   azelastine 0.1 % nasal spray Commonly known as:  ASTELIN Place 2 sprays into both nostrils 2 (two) times daily. Use in each nostril as directed What changed:    when to take this  reasons to take this  additional instructions   BREO ELLIPTA 100-25 MCG/INH Aepb Generic drug:  fluticasone furoate-vilanterol   calcium carbonate 1250 (500 Ca) MG chewable tablet Commonly known as:  OS-CAL Chew 1 tablet by mouth daily.   cholecalciferol 1000 units tablet Commonly known as:  VITAMIN D Take 1,000 Units by mouth daily.   COUMADIN 5 MG tablet Generic drug:  warfarin Take as directed. If you are unsure how to take this medication, talk to your nurse or doctor. Original instructions:  Take 2 tablets daily except 1 tablet on Tuesdays What changed:  additional instructions   digoxin 0.125 MG tablet Commonly known as:  LANOXIN Take 1 tablet (0.125 mg total) by mouth every other day.   docusate sodium 100 MG capsule Commonly known as:  COLACE Take 300 mg by mouth at bedtime.   fluticasone 50 MCG/ACT nasal spray Commonly known as:  FLONASE USE 2 SPRAYS IN EACH       NOSTRIL ONCE DAILY What changed:    how much to take  how to take this  when to take this   folic acid 1 MG tablet Commonly known as:  FOLVITE Take 1 tablet (1 mg total) by mouth daily.   furosemide 40 MG tablet Commonly known as:  LASIX Take 1 tablet by mouth  daily   gabapentin 300 MG capsule Commonly known as:  NEURONTIN Take 1 capsule (300 mg total) by mouth at bedtime.   hydrALAZINE 25 MG tablet Commonly known as:  APRESOLINE Take 1 tablet (25 mg total) by mouth 3 (three) times daily.   HYDROcodone-acetaminophen 7.5-325 MG tablet Commonly known as:  NORCO Take 1 tablet by mouth every 6 (six) hours as needed for moderate pain (Must last 30 days.).   isosorbide mononitrate 60 MG 24 hr tablet Commonly known as:  IMDUR Take 1 tablet (60 mg total) by mouth daily.    losartan 50 MG tablet Commonly known as:  COZAAR take 1 tablet by mouth once daily   meclizine 25 MG tablet Commonly known as:  ANTIVERT Take 1 tablet (25 mg total) by mouth 3 (three) times daily as needed for dizziness.   metolazone 2.5 MG tablet Commonly known as:  ZAROXOLYN TAKE 1 TABLET ONCE A WEEK   ondansetron 4 MG tablet Commonly known as:  ZOFRAN Take 1 tablet (4 mg total) by mouth daily as needed for nausea or vomiting.   oseltamivir 30 MG capsule Commonly known as:  TAMIFLU Take 1 capsule (30 mg total) by mouth 2 (two) times daily.   potassium chloride SA 20 MEQ tablet Commonly known as:  KLOR-CON M20 Take 1 tablet by mouth  daily   pravastatin 40 MG tablet Commonly known as:  PRAVACHOL Take 1 tablet (40 mg total) by mouth every evening.   SYNTHROID 75 MCG tablet Generic drug:  levothyroxine Take 1 tablet by mouth  daily   SYSTANE OP Place 1 drop into both eyes daily as needed (dry eyes).   TOPROL XL 100 MG 24 hr tablet Generic drug:  metoprolol succinate Take 1 tablet (100 mg total) by mouth daily. Take with or immediately following a meal.      Allergies  Allergen Reactions  . Penicillins Other (See Comments)    Bruise Has patient had a PCN reaction causing immediate rash, facial/tongue/throat swelling, SOB or lightheadedness with hypotension: no Has patient had a PCN reaction causing severe rash involving mucus membranes or skin necrosis: no Has patient had a PCN reaction that required hospitalization: no Has patient had a PCN reaction occurring within the last 10 years: no If all of the above answers are "NO", then may proceed with Cephalosporin use.   . Aspirin Other (See Comments)    On coumadin  . Fentanyl Dermatitis    Rash with patch   . Niacin Itching   Follow-up Information    Fayrene Helper, MD. Schedule an appointment as soon as possible for a visit in 2 week(s).   Specialty:  Family Medicine Contact information: [address] [place] [address] [phone]        Sanda Klein, MD .   Specialty:  Cardiology Contact information: [address] [place] [address] [phone]            The results of significant diagnostics from this hospitalization (including imaging, microbiology, ancillary and laboratory) are listed below for reference.    Significant Diagnostic Studies: Dg Chest 2 View  Result Date: 11/01/2017 CLINICAL DATA:  Shortness of breath, in the emergency department yesterday for the flu, began wheezing and feeling worse today, history of heart block, CHF, COPD, diabetes mellitus, hypertension, non ischemic cardiomyopathy, former smoker EXAM: CHEST  2 VIEW COMPARISON:  10/31/2017 FINDINGS: LEFT subclavian AICD leads appear stable. Enlargement of cardiac silhouette with minimal pulmonary vascular congestion. Atherosclerotic calcifications aorta. Emphysematous and bronchitic changes consistent with COPD. Interstitial prominence in both lungs slightly greater on RIGHT, increased since previous study question mild pulmonary edema. No segmental consolidation, pleural effusion, or pneumothorax. Bones demineralized. IMPRESSION: Suspected mild pulmonary edema. Electronically Signed   By: Lavonia Dana M.D.   On: 11/01/2017 19:52   Dg Chest 2 View  Result Date: 10/31/2017 CLINICAL DATA:  Cough EXAM: CHEST  2 VIEW COMPARISON:  08/31/2017 FINDINGS: Cardiac enlargement without heart failure. Aortic and mitral valve replacement unchanged in position. Internal cardiac defibrillator leads unchanged from the prior study. Lungs are clear without infiltrate effusion or mass. Atherosclerotic aorta. IMPRESSION: No active cardiopulmonary disease. Electronically Signed   By: Franchot Gallo M.D.   On: 10/31/2017 13:44    Microbiology: No results found for this or any previous visit (from the past 240 hour(s)).   Labs: Basic Metabolic Panel: Recent Labs  Lab 10/31/17 1408  11/01/17 1901 11/02/17 0837  NA 142 137 139  K 4.5 3.9 4.2  CL 103 102 101  CO2 26 23 25   GLUCOSE 114* 105* 170*  BUN 48* 54* 55*  CREATININE 2.03* 1.93* 1.74*  CALCIUM 9.5 9.1 9.2   Liver Function Tests: Recent Labs  Lab 10/31/17 1408 11/01/17 1901  AST 30 31  ALT 16 16  ALKPHOS 48  41  BILITOT 0.7 0.9  PROT 8.3* 7.3  ALBUMIN 4.1 3.7   No results for input(s): LIPASE, AMYLASE in the last 168 hours. No results for input(s): AMMONIA in the last 168 hours. CBC: Recent Labs  Lab 10/31/17 1408 11/01/17 1901 11/02/17 0837  WBC 5.6 6.0 4.2  NEUTROABS 4.3 4.0  --   HGB 10.2* 9.2* 9.7*  HCT 32.6* 29.0* 31.2*  MCV 96.2 94.2 94.8  PLT 157 162 157   Cardiac Enzymes: No results for input(s): CKTOTAL, CKMB, CKMBINDEX, TROPONINI in the last 168 hours. BNP: BNP (last 3 results) Recent Labs    06/20/17 1221 08/31/17 1700 11/01/17 1901  BNP 251.0* 357.0* 303.0*    ProBNP (last 3 results) No results for input(s): PROBNP in the last 8760 hours.  CBG: Recent Labs  Lab 10/31/17 1321  GLUCAP 91       Signed:  [place] [facility] Pager: [phone] 11/02/2017, 4:41 PM

## 2017-11-02 NOTE — Progress Notes (Signed)
ANTICOAGULATION CONSULT NOTE -  Pharmacy Consult for Warfarin Indication: Mechanical AVR and MVR  Allergies  Allergen Reactions  . Penicillins Other (See Comments)    Bruise Has patient had a PCN reaction causing immediate rash, facial/tongue/throat swelling, SOB or lightheadedness with hypotension: no Has patient had a PCN reaction causing severe rash involving mucus membranes or skin necrosis: no Has patient had a PCN reaction that required hospitalization: no Has patient had a PCN reaction occurring within the last 10 years: no If all of the above answers are "NO", then may proceed with Cephalosporin use.   . Aspirin Other (See Comments)    On coumadin  . Fentanyl Dermatitis    Rash with patch   . Niacin Itching    Patient Measurements: Height: 5\' 7"  (170.2 cm) Weight: 218 lb 4.8 oz (99 kg) IBW/kg (Calculated) : 61.6  Vital Signs: BP: 120/87 (02/08 0606) Pulse Rate: 71 (02/08 0606)  Labs: Recent Labs    10/31/17 1408 10/31/17 1436 11/01/17 1901 11/02/17 0837  HGB 10.2*  --  9.2* 9.7*  HCT 32.6*  --  29.0* 31.2*  PLT 157  --  162 157  LABPROT  --  29.7*  --  22.8*  INR  --  2.86  --  2.04  CREATININE 2.03*  --  1.93* 1.74*    Estimated Creatinine Clearance: 29.6 mL/min (A) (by C-G formula based on SCr of 1.74 mg/dL (H)).   Medical History: Past Medical History:  Diagnosis Date  . Adenomatous polyp of colon 12/16/2002   Dr. [name]/[facility]  . Allergy   . Cataract   . CHB (complete heart block) ([place]) 10/07/2014      . CHF (congestive heart failure) ([place])       . Chronic kidney disease, stage I 2006  . Constipation       . COPD (chronic obstructive pulmonary disease) ([place])       . DJD (degenerative joint disease) of lumbar spine   . DM (diabetes mellitus) ([place]) 2006  . Esophageal reflux   . Glaucoma   . Gout       . Heart murmur   . Hyperlipemia   . IDA (iron deficiency anemia)    Parenteral iron/[name]   . Insomnia       . Non-ischemic cardiomyopathy ([place])    a. s/p MDT CRTD  . Obesity, unspecified   . Other and unspecified hyperlipidemia   . Oxygen deficiency 2014   nocturnal;  . Spondylolisthesis   . Spondylosis   . Thyroid cancer ([place])    remote thyroidectomy, no recurrence, pt denies in 2016 thyroid cancer  . Unspecified hypothyroidism       . 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 Vertigo         Medications:  Medications Prior to Admission  Medication Sig Dispense Refill Last Dose  . acetaminophen (TYLENOL) 500 MG tablet Take 500 mg by mouth every 6 (six) hours as needed (pain).   10/31/2017 at Unknown time  . albuterol (PROVENTIL) (2.5 MG/3ML) 0.083% nebulizer solution INHALE 1 VIAL VIA NEBULIZER THREE TIMES DAILY. (Patient taking differently: INHALE 1 VIAL VIA NEBULIZER THREE TIMES DAILY AS NEEDED) 90 vial 5 11/01/2017 at Unknown time  . albuterol (VENTOLIN HFA) 108 (90 Base) MCG/ACT inhaler Inhale 2 puffs into the lungs every 6 (six) hours as needed for wheezing or shortness of breath. 3 Inhaler 3 10/31/2017 at Unknown time  . allopurinol (ZYLOPRIM) 100  MG tablet Take 1 tablet (100 mg total) by mouth daily. 90 tablet 1 10/31/2017 at Unknown time  . azelastine (ASTELIN) 0.1 % nasal spray Place 2 sprays into both nostrils 2 (two) times daily. Use in each nostril as directed (Patient taking differently: Place 2 sprays into both nostrils daily as needed for allergies. Use in each nostril as directed) 30 mL 12 11/01/2017 at Unknown time  . BREO ELLIPTA 100-25 MCG/INH AEPB   1 11/01/2017 at Unknown time  . calcium carbonate (OS-CAL) 1250 (500 Ca) MG chewable tablet Chew 1 tablet by mouth daily.   Past Week at Unknown time  . cholecalciferol (VITAMIN D) 1000 UNITS tablet Take 1,000 Units by mouth daily.   10/31/2017 at Unknown time  . COUMADIN 5 MG tablet Take 2 tablets daily except 1 tablet on Tuesdays (Patient taking differently: Take 1 tablet Tuesday, Thursday, and  Saturday and  Take 2 tablets all other days.) 60 tablet 6 10/31/2017 at 2030  . digoxin (LANOXIN) 0.125 MG tablet Take 1 tablet (0.125 mg total) by mouth every other day. 45 tablet 3 10/31/2017 at Unknown time  . docusate sodium (COLACE) 100 MG capsule Take 300 mg by mouth at bedtime.   Past Week at Unknown time  . fluticasone (FLONASE) 50 MCG/ACT nasal spray USE 2 SPRAYS IN EACH       NOSTRIL ONCE DAILY (Patient taking differently: USE 2 SPRAYS IN EACH       NOSTRIL ONCE DAILY AS NEEDED FOR CONGESTION) 48 g 1 10/31/2017 at Unknown time  . folic acid (FOLVITE) 1 MG tablet Take 1 tablet (1 mg total) by mouth daily. 100 tablet 3 10/31/2017 at Unknown time  . furosemide (LASIX) 40 MG tablet Take 1 tablet by mouth  daily 90 tablet 1 10/31/2017 at Unknown time  . gabapentin (NEURONTIN) 300 MG capsule Take 1 capsule (300 mg total) by mouth at bedtime. 30 capsule 3 Past Week at Unknown time  . hydrALAZINE (APRESOLINE) 25 MG tablet Take 1 tablet (25 mg total) by mouth 3 (three) times daily. 270 tablet 3 10/31/2017 at Unknown time  . HYDROcodone-acetaminophen (NORCO) 7.5-325 MG tablet Take 1 tablet by mouth every 6 (six) hours as needed for moderate pain (Must last 30 days.). 70 tablet 0 Past Week at Unknown time  . isosorbide mononitrate (IMDUR) 60 MG 24 hr tablet Take 1 tablet (60 mg total) by mouth daily. 90 tablet 1 11/01/2017 at Unknown time  . losartan (COZAAR) 50 MG tablet take 1 tablet by mouth once daily 90 tablet 1 10/31/2017 at Unknown time  . meclizine (ANTIVERT) 25 MG tablet Take 1 tablet (25 mg total) by mouth 3 (three) times daily as needed for dizziness. 30 tablet 0 Past Month at Unknown time  . metolazone (ZAROXOLYN) 2.5 MG tablet TAKE 1 TABLET ONCE A WEEK 12 tablet 2 Past Week at Unknown time  . ondansetron (ZOFRAN) 4 MG tablet Take 1 tablet (4 mg total) by mouth daily as needed for nausea or vomiting. 90 tablet 0 Past Week at Unknown time  . oseltamivir (TAMIFLU) 30 MG capsule Take 1 capsule (30 mg total)  by mouth 2 (two) times daily. 9 capsule 0 11/01/2017 at Unknown time  . Polyethyl [name] (SYSTANE OP) Place 1 drop into both eyes daily as needed (dry eyes).   10/31/2017 at Unknown time  . potassium chloride SA (KLOR-CON M20) 20 MEQ tablet Take 1 tablet by mouth  daily 90 tablet 1 10/31/2017 at Unknown time  . pravastatin (  PRAVACHOL) 40 MG tablet Take 1 tablet (40 mg total) by mouth every evening. 90 tablet 3 10/31/2017 at Unknown time  . SYNTHROID 75 MCG tablet Take 1 tablet by mouth  daily 90 tablet 1 11/01/2017 at Unknown time  . TOPROL XL 100 MG 24 hr tablet Take 1 tablet (100 mg total) by mouth daily. Take with or immediately following a meal. 90 tablet 1 10/31/2017 at 2030  . Darbepoetin Alfa (ARANESP, ALBUMIN FREE, IJ) EVERY THURSDAY   Not Taking at Unknown time    Home Warfarin = 5mg  Tuesday, Thursday and Saturday, 10mg  Monday, Wednesday, Friday and Sunday per home med list.  Assessment: Okay for Protocol, INR therpaeutic Goal of Therapy:  INR 2-2.5 per home anticoag flow sheet.  INR goal reduced 04/2017 due to bruising per chart notes.  Plan:  Warfarin 10mg  PO x 1 Daily PT/INR  Monitor for signs and symptoms of bleeding.   Isac Sarna, BS [place], BCPS Clinical Pharmacist Pager [phone] 11/02/2017,11:55 AM

## 2017-11-02 NOTE — Care Management Note (Signed)
Case Management Note  Patient Details  Name: [name] [name] MRN: [id number] Date of Birth: October 08, 1931  Subjective/Objective:   Adm with influenza. From home with nephew. Has cane pta and night time oxygen, unsure of provider. Has PCP-Dr. Simpson-Niece takes patient to appts. Has insurance with prescription coverage. Gets prescriptions filled at [facility]. No current Home health. Has had [facility] in the past.   Action/Plan: DC home. Will follow for needs.   Expected Discharge Date:     11/03/2017             Expected Discharge Plan:     In-House Referral:     Discharge planning Services  CM Consult  Post Acute Care Choice:    Choice offered to:     DME Arranged:    DME Agency:     HH Arranged:    HH Agency:     Status of Service:  In process, will continue to follow  If discussed at Long Length of Stay Meetings, dates discussed:    Additional Comments:  Vandora Jaskulski, Chauncey [place], RN 11/02/2017, 11:56 AM

## 2017-11-02 NOTE — Care Management Obs Status (Signed)
[place] NOTIFICATION   Patient Details  Name: [name] [name] MRN: [id number] Date of Birth: 1931-11-29   Medicare Observation Status Notification Given:  Yes    Tyson Masin, Chauncey [place], RN 11/02/2017, 11:56 AM

## 2017-11-02 NOTE — Progress Notes (Signed)
IV Removed and discharge papers signed.  To call for ride

## 2017-11-05 ENCOUNTER — Telehealth: Payer: Self-pay | Admitting: Family Medicine

## 2017-11-05 ENCOUNTER — Encounter: Payer: Self-pay | Admitting: Family Medicine

## 2017-11-05 ENCOUNTER — Ambulatory Visit (INDEPENDENT_AMBULATORY_CARE_PROVIDER_SITE_OTHER): Payer: Medicare Other | Admitting: *Deleted

## 2017-11-05 ENCOUNTER — Ambulatory Visit (INDEPENDENT_AMBULATORY_CARE_PROVIDER_SITE_OTHER): Payer: Medicare Other | Admitting: Family Medicine

## 2017-11-05 VITALS — BP 124/70 | HR 70 | Temp 98.3°F | Resp 16 | Ht 67.0 in | Wt 218.0 lb

## 2017-11-05 DIAGNOSIS — I481 Persistent atrial fibrillation: Secondary | ICD-10-CM | POA: Diagnosis not present

## 2017-11-05 DIAGNOSIS — Z5181 Encounter for therapeutic drug level monitoring: Secondary | ICD-10-CM

## 2017-11-05 DIAGNOSIS — Z952 Presence of prosthetic heart valve: Secondary | ICD-10-CM

## 2017-11-05 DIAGNOSIS — Z09 Encounter for follow-up examination after completed treatment for conditions other than malignant neoplasm: Secondary | ICD-10-CM | POA: Diagnosis not present

## 2017-11-05 DIAGNOSIS — I4819 Other persistent atrial fibrillation: Secondary | ICD-10-CM

## 2017-11-05 LAB — POCT INR: INR: 2.6

## 2017-11-05 MED ORDER — ISOSORBIDE MONONITRATE ER 60 MG PO TB24: 60.0000 mg | ORAL_TABLET | Freq: Every day | ORAL | 3 refills | Status: DC

## 2017-11-05 NOTE — Telephone Encounter (Signed)
Transition Care Management Follow-up Telephone Call   Date discharged?  11/02/17              How have you been since you were released from the hospital? Short of breath, but good.    Do you understand why you were in the hospital? Yes   Do you understand the discharge instructions? Yes   Where were you discharged to? Home   Items Reviewed:  Medications reviewed: Yes  Allergies reviewed: Yes  Dietary changes reviewed: No changes  Referrals reviewed: No referrals   Functional Questionnaire:   Activities of Daily Living (ADLs):  Patient is independent in all ADLs, but verbalizes that she could really use help taking a bath.     Any transportation issues/concerns?: No   Any patient concerns? No   Confirmed importance and date/time of follow-up visits scheduled    Yes  Confirmed with patient if condition begins to worsen call PCP or go to the ER.  Patient was given the office number and encouraged to call back with question or concerns.  :   Yes

## 2017-11-05 NOTE — Patient Instructions (Signed)
F/u in end March , call if you need me before  Please go to coumadin clinic today, we will call to let thwm know you are on your way  May need to reschedule appointment for procrit this  Wednesday  or the gel injection Careful not to fall   Finish tamiflu

## 2017-11-05 NOTE — Patient Instructions (Signed)
Continue coumadin 2 tablets daily except 1 tablet on Tuesdays, Thursdays and Saturdays.  Recheck in 4 weeks.

## 2017-11-06 ENCOUNTER — Other Ambulatory Visit: Payer: Self-pay | Admitting: *Deleted

## 2017-11-06 MED ORDER — ISOSORBIDE MONONITRATE ER 60 MG PO TB24: 60.0000 mg | ORAL_TABLET | Freq: Every day | ORAL | 1 refills | Status: DC

## 2017-11-06 NOTE — Telephone Encounter (Signed)
REFILL 

## 2017-11-07 ENCOUNTER — Inpatient Hospital Stay (HOSPITAL_COMMUNITY): Payer: Medicare Other | Attending: Internal Medicine

## 2017-11-07 ENCOUNTER — Inpatient Hospital Stay (HOSPITAL_COMMUNITY): Payer: Medicare Other

## 2017-11-07 VITALS — BP 112/47 | HR 73 | Temp 97.6°F | Resp 18

## 2017-11-07 DIAGNOSIS — N184 Chronic kidney disease, stage 4 (severe): Secondary | ICD-10-CM

## 2017-11-07 DIAGNOSIS — D649 Anemia, unspecified: Secondary | ICD-10-CM

## 2017-11-07 DIAGNOSIS — D631 Anemia in chronic kidney disease: Secondary | ICD-10-CM | POA: Diagnosis not present

## 2017-11-07 DIAGNOSIS — Z09 Encounter for follow-up examination after completed treatment for conditions other than malignant neoplasm: Secondary | ICD-10-CM | POA: Insufficient documentation

## 2017-11-07 DIAGNOSIS — E611 Iron deficiency: Secondary | ICD-10-CM

## 2017-11-07 DIAGNOSIS — M1711 Unilateral primary osteoarthritis, right knee: Secondary | ICD-10-CM | POA: Diagnosis not present

## 2017-11-07 DIAGNOSIS — M25561 Pain in right knee: Secondary | ICD-10-CM | POA: Diagnosis not present

## 2017-11-07 LAB — CBC WITH DIFFERENTIAL/PLATELET
Basophils Absolute: 0 10*3/uL (ref 0.0–0.1)
Basophils Relative: 0 %
Eosinophils Absolute: 0 10*3/uL (ref 0.0–0.7)
Eosinophils Relative: 0 %
HCT: 30.4 % — ABNORMAL LOW (ref 36.0–46.0)
Hemoglobin: 9.6 g/dL — ABNORMAL LOW (ref 12.0–15.0)
Lymphocytes Relative: 14 %
Lymphs Abs: 1.3 10*3/uL (ref 0.7–4.0)
MCH: 29.8 pg (ref 26.0–34.0)
MCHC: 31.6 g/dL (ref 30.0–36.0)
MCV: 94.4 fL (ref 78.0–100.0)
Monocytes Absolute: 1.1 10*3/uL — ABNORMAL HIGH (ref 0.1–1.0)
Monocytes Relative: 11 %
Neutro Abs: 7.2 10*3/uL (ref 1.7–7.7)
Neutrophils Relative %: 75 %
Platelets: 196 10*3/uL (ref 150–400)
RBC: 3.22 MIL/uL — ABNORMAL LOW (ref 3.87–5.11)
RDW: 16.1 % — ABNORMAL HIGH (ref 11.5–15.5)
WBC: 9.6 10*3/uL (ref 4.0–10.5)

## 2017-11-07 LAB — COMPREHENSIVE METABOLIC PANEL
ALT: 17 U/L (ref 14–54)
AST: 32 U/L (ref 15–41)
Albumin: 3.8 g/dL (ref 3.5–5.0)
Alkaline Phosphatase: 45 U/L (ref 38–126)
Anion gap: 11 (ref 5–15)
BUN: 55 mg/dL — ABNORMAL HIGH (ref 6–20)
CO2: 26 mmol/L (ref 22–32)
Calcium: 9.3 mg/dL (ref 8.9–10.3)
Chloride: 98 mmol/L — ABNORMAL LOW (ref 101–111)
Creatinine, Ser: 2.17 mg/dL — ABNORMAL HIGH (ref 0.44–1.00)
GFR calc Af Amer: 23 mL/min — ABNORMAL LOW (ref >60)
GFR calc non Af Amer: 20 mL/min — ABNORMAL LOW (ref >60)
Glucose, Bld: 104 mg/dL — ABNORMAL HIGH (ref 65–99)
Potassium: 4.7 mmol/L (ref 3.5–5.1)
Sodium: 135 mmol/L (ref 135–145)
Total Bilirubin: 0.6 mg/dL (ref 0.3–1.2)
Total Protein: 7.7 g/dL (ref 6.5–8.1)

## 2017-11-07 LAB — IRON AND TIBC
Iron: 54 ug/dL (ref 28–170)
Saturation Ratios: 19 % (ref 10.4–31.8)
TIBC: 284 ug/dL (ref 250–450)
UIBC: 230 ug/dL

## 2017-11-07 LAB — FERRITIN: Ferritin: 1217 ng/mL — ABNORMAL HIGH (ref 11–307)

## 2017-11-07 MED ORDER — EPOETIN ALFA 40000 UNIT/ML IJ SOLN
40000.0000 [IU] | Freq: Once | INTRAMUSCULAR | Status: AC
  Administered 2017-11-07: 40000 [IU] via SUBCUTANEOUS
  Filled 2017-11-07: qty 1

## 2017-11-07 NOTE — Assessment & Plan Note (Signed)
Hospital course reviewed with the patient , also her medication a time of discharge. I have advised her to complete the tamiflu which she does have She may keep out patient appointments already scheduled , she is no longer febrile and has completed 5 days otf antiviral treatment

## 2017-11-07 NOTE — Progress Notes (Signed)
   [name] [name]     MRN: [id number]      DOB: [birth year]   HPI [name] is here for follow up of recent hospitalization for influenza A, she was admitted on 02/07 and discharged on 11/02/2017 She had initially presented to the ED on 10/31/2017 and was sx with influenza on that day and sent home on tamiflu, she however decompensated and returned and was admitted Prior to 10/31/2017, she states that she was well, states she feels she was exposed to the virus the day before at [facility] course is reviewed t the visit. She currently has 1 addition mal day of tamiflu to take , denies fever over the past 48 hours, still however feels weak and does have some generalized muscle aches still Has 2 appts in next 2 days, 1 for injections in the knees and the other for Procrit, not sure which she wants to defer Apetite is fair No productive cough, no sinusdrainage ROS Denies recent fever or chills. Denies sinus pressure, nasal congestion, ear pain or sore throat. Denies chest congestion, productive cough or wheezing. Denies chest pains, palpitations and leg swelling Denies abdominal pain, nausea, vomiting,diarrhea or constipation.   Denies dysuria, frequency, hesitancy or incontinence. Denies joint pain, swelling and limitation in mobility. Denies headaches, seizures, numbness, or tingling. Denies depression, anxiety or insomnia. Denies skin break down or rash.   PE  BP 124/70   Pulse 70   Temp 98.3 F (36.8 C) (Oral)   Resp 16   Ht 5\' 7"  (1.702 m)   Wt 218 lb (98.9 kg)   SpO2 90%   BMI 34.14 kg/m   Patient alert and oriented and in no cardiopulmonary distress.mildly ill apppearing HEENT: No facial asymmetry, EOMI,   oropharynx pink and moist.  Neck supple no JVD, no mass.  Chest: Adequate air entry , few scattered crackles , no wheezes.  CVS: S1, S2 no murmurs, no S3.Regular rate.  ABD: Soft non tender.   Ext: No edema  MS: decreased ROM spine, shoulders, hips and  knees.  Skin: Intact, no ulcerations or rash noted.  Psych: Good eye contact, normal affect. Memory intact not anxious or depressed appearing.  CNS: CN 2-12 intact, power,  normal throughout.no focal deficits noted.   [facility] discharge follow-up Hospital course reviewed with the patient , also her medication a time of discharge. I have advised her to complete the tamiflu which she does have She may keep out patient appointments already scheduled , she is no longer febrile and has completed 5 days otf antiviral treatment

## 2017-11-08 ENCOUNTER — Encounter (HOSPITAL_COMMUNITY): Payer: Self-pay

## 2017-11-08 ENCOUNTER — Other Ambulatory Visit: Payer: Self-pay

## 2017-11-08 NOTE — Progress Notes (Signed)
[name] [name] presents today for injection per MD orders. Procrit 40,000 administered SQ in left Abdomen. Administration without incident. Patient tolerated well.  Treatment given per orders. Patient tolerated it well without problems. Vitals stable and discharged home from clinic via wheelchair. Follow up as scheduled.

## 2017-11-08 NOTE — Patient Instructions (Signed)
[facility] Discharge Instructions  RECOMMENDATIONS MADE BY THE CONSULTANT AND ANY TEST RESULTS WILL BE SENT TO YOUR REFERRING PHYSICIAN.  Procrit given today. Follow up as scheduled.  Thank you for choosing [facility] at [facility] to provide your oncology and hematology care.  To afford each patient quality time with our provider, please arrive at least 15 minutes before your scheduled appointment time.    If you have a lab appointment with the Cancer Center please come in thru the  Main Entrance and check in at the main information desk  You need to re-schedule your appointment should you arrive 10 or more minutes late.  We strive to give you quality time with our providers, and arriving late affects you and other patients whose appointments are after yours.  Also, if you no show three or more times for appointments you may be dismissed from the clinic at the providers discretion.     Again, thank you for choosing [facility].  Our hope is that these requests will decrease the amount of time that you wait before being seen by our physicians.       _____________________________________________________________  Should you have questions after your visit to [facility], please contact our office at [phone] between the hours of 8:30 a.m. and 4:30 p.m.  Voicemails left after 4:30 p.m. will not be returned until the following business day.  For prescription refill requests, have your pharmacy contact our office.       Resources For Cancer Patients and their Caregivers ? American Cancer Society: Can assist with transportation, wigs, general needs, runs Look Good Feel Better.        1-888-227-6333 ? Cancer Care: Provides financial assistance, online support groups, medication/co-pay assistance.  1-800-813-HOPE (4673) ? [facility] Assists [place] cancer patients and  their families through emotional , educational and financial support.  336-427-4357 ? [place] DSS Where to apply for food stamps, Medicaid and utility assistance. 336-342-1394 ? [facility]: Transportation to medical appointments. 336-347-2287 ? Social Security Administration: May apply for disability if have a Stage IV cancer. 336-342-7796 1-800-772-1213 ? [place] Aging, Disability and Transit Services: Assists with nutrition, care and transit needs. 336-349-2343  Cancer Center Support Programs: @10RELATIVEDAYS@ > Cancer Support Group  2nd Tuesday of the month 1pm-2pm, Journey Room  > Creative Journey  3rd Tuesday of the month 1130am-1pm, Journey Room  > Look Good Feel Better  1st Wednesday of the month 10am-12 noon, Journey Room (Call American Cancer Society to register 1-800-395-5775)   

## 2017-11-12 ENCOUNTER — Other Ambulatory Visit: Payer: Self-pay | Admitting: *Deleted

## 2017-11-12 NOTE — Patient Outreach (Signed)
[facility]) Care Management  11/12/2017  [name] [name] [birth year] [id number]   Care coordination  [name] on CM mobile to request a return call CM returned her call and was updated that she had been seen for pneumonia but was much better.   She reported concerns with her pharmacy changing from [facility] to [facility], the wait time on the phone and Dr [name] office referred her back to pharmacy.  She reports when CM inquired that she has not gone to the pharmacy to request assistance.  CM recommended she go to the new pharmacy to discuss her concerns and to get assistance. She reports she is able to do this on "tomorrow" Reminded her on preventive measures for the the flu and she voiced understanding  Aitanna Haubner L. Lavina [name], RN, BSN, [place] Coordinator [phone] week day mobile

## 2017-11-13 DIAGNOSIS — L603 Nail dystrophy: Secondary | ICD-10-CM | POA: Diagnosis not present

## 2017-11-13 DIAGNOSIS — E1142 Type 2 diabetes mellitus with diabetic polyneuropathy: Secondary | ICD-10-CM | POA: Diagnosis not present

## 2017-11-13 DIAGNOSIS — L851 Acquired keratosis [keratoderma] palmaris et plantaris: Secondary | ICD-10-CM | POA: Diagnosis not present

## 2017-11-13 LAB — CUP PACEART INCLINIC DEVICE CHECK
Battery Remaining Longevity: 12 mo
Battery Voltage: 2.9 V
Brady Statistic AP VP Percent: 76.69 %
Brady Statistic AP VS Percent: 0 %
Brady Statistic AS VP Percent: 23.22 %
Brady Statistic AS VS Percent: 0.09 %
Brady Statistic RA Percent Paced: 71.67 %
Brady Statistic RV Percent Paced: 99.31 %
Date Time Interrogation Session: 20190108200240
HighPow Impedance: 35 Ohm
HighPow Impedance: 45 Ohm
Implantable Lead Implant Date: 20040521
Implantable Lead Implant Date: 20071218
Implantable Lead Implant Date: 20160516
Implantable Lead Location: 753858
Implantable Lead Location: 753859
Implantable Lead Location: 753860
Implantable Lead Model: 5076
Implantable Lead Model: 6947
Implantable Pulse Generator Implant Date: 20160516
Lead Channel Impedance Value: 285 Ohm
Lead Channel Impedance Value: 304 Ohm
Lead Channel Impedance Value: 304 Ohm
Lead Channel Impedance Value: 342 Ohm
Lead Channel Impedance Value: 342 Ohm
Lead Channel Impedance Value: 361 Ohm
Lead Channel Impedance Value: 475 Ohm
Lead Channel Impedance Value: 513 Ohm
Lead Channel Impedance Value: 551 Ohm
Lead Channel Impedance Value: 551 Ohm
Lead Channel Impedance Value: 722 Ohm
Lead Channel Impedance Value: 722 Ohm
Lead Channel Impedance Value: 760 Ohm
Lead Channel Pacing Threshold Amplitude: 0.625 V
Lead Channel Pacing Threshold Amplitude: 1.375 V
Lead Channel Pacing Threshold Amplitude: 5 V
Lead Channel Pacing Threshold Pulse Width: 0.4 ms
Lead Channel Pacing Threshold Pulse Width: 0.4 ms
Lead Channel Pacing Threshold Pulse Width: 1.5 ms
Lead Channel Sensing Intrinsic Amplitude: 1.625 mV
Lead Channel Sensing Intrinsic Amplitude: 1.625 mV
Lead Channel Sensing Intrinsic Amplitude: 10.5 mV
Lead Channel Sensing Intrinsic Amplitude: 10.5 mV
Lead Channel Setting Pacing Amplitude: 2 V
Lead Channel Setting Pacing Amplitude: 2.5 V
Lead Channel Setting Pacing Amplitude: 2.5 V
Lead Channel Setting Pacing Pulse Width: 0.6 ms
Lead Channel Setting Pacing Pulse Width: 1 ms
Lead Channel Setting Sensing Sensitivity: 0.3 mV

## 2017-11-14 DIAGNOSIS — M1711 Unilateral primary osteoarthritis, right knee: Secondary | ICD-10-CM | POA: Diagnosis not present

## 2017-11-14 DIAGNOSIS — M25561 Pain in right knee: Secondary | ICD-10-CM | POA: Diagnosis not present

## 2017-11-15 ENCOUNTER — Emergency Department (HOSPITAL_COMMUNITY): Payer: Medicare Other

## 2017-11-15 ENCOUNTER — Encounter (HOSPITAL_COMMUNITY): Payer: Self-pay | Admitting: Emergency Medicine

## 2017-11-15 ENCOUNTER — Other Ambulatory Visit: Payer: Self-pay

## 2017-11-15 ENCOUNTER — Inpatient Hospital Stay (HOSPITAL_COMMUNITY)
Admission: EM | Admit: 2017-11-15 | Discharge: 2017-11-17 | DRG: 291 | Disposition: A | Discharge status: Home Health | Payer: Medicare Other | Attending: Internal Medicine | Admitting: Internal Medicine

## 2017-11-15 DIAGNOSIS — E785 Hyperlipidemia, unspecified: Secondary | ICD-10-CM | POA: Diagnosis not present

## 2017-11-15 DIAGNOSIS — E890 Postprocedural hypothyroidism: Secondary | ICD-10-CM | POA: Diagnosis present

## 2017-11-15 DIAGNOSIS — Z79899 Other long term (current) drug therapy: Secondary | ICD-10-CM

## 2017-11-15 DIAGNOSIS — M8580 Other specified disorders of bone density and structure, unspecified site: Secondary | ICD-10-CM | POA: Diagnosis present

## 2017-11-15 DIAGNOSIS — Z95810 Presence of automatic (implantable) cardiac defibrillator: Secondary | ICD-10-CM

## 2017-11-15 DIAGNOSIS — M109 Gout, unspecified: Secondary | ICD-10-CM | POA: Diagnosis present

## 2017-11-15 DIAGNOSIS — J441 Chronic obstructive pulmonary disease with (acute) exacerbation: Secondary | ICD-10-CM | POA: Diagnosis not present

## 2017-11-15 DIAGNOSIS — Z86010 Personal history of colonic polyps: Secondary | ICD-10-CM | POA: Diagnosis not present

## 2017-11-15 DIAGNOSIS — Z952 Presence of prosthetic heart valve: Secondary | ICD-10-CM | POA: Diagnosis not present

## 2017-11-15 DIAGNOSIS — N184 Chronic kidney disease, stage 4 (severe): Secondary | ICD-10-CM | POA: Diagnosis present

## 2017-11-15 DIAGNOSIS — M79606 Pain in leg, unspecified: Secondary | ICD-10-CM

## 2017-11-15 DIAGNOSIS — Z801 Family history of malignant neoplasm of trachea, bronchus and lung: Secondary | ICD-10-CM

## 2017-11-15 DIAGNOSIS — I130 Hypertensive heart and chronic kidney disease with heart failure and stage 1 through stage 4 chronic kidney disease, or unspecified chronic kidney disease: Secondary | ICD-10-CM | POA: Diagnosis not present

## 2017-11-15 DIAGNOSIS — Z9089 Acquired absence of other organs: Secondary | ICD-10-CM | POA: Diagnosis not present

## 2017-11-15 DIAGNOSIS — E1122 Type 2 diabetes mellitus with diabetic chronic kidney disease: Secondary | ICD-10-CM | POA: Diagnosis present

## 2017-11-15 DIAGNOSIS — J410 Simple chronic bronchitis: Secondary | ICD-10-CM | POA: Diagnosis not present

## 2017-11-15 DIAGNOSIS — Z85850 Personal history of malignant neoplasm of thyroid: Secondary | ICD-10-CM

## 2017-11-15 DIAGNOSIS — Z888 Allergy status to other drugs, medicaments and biological substances status: Secondary | ICD-10-CM

## 2017-11-15 DIAGNOSIS — E875 Hyperkalemia: Secondary | ICD-10-CM | POA: Diagnosis present

## 2017-11-15 DIAGNOSIS — E669 Obesity, unspecified: Secondary | ICD-10-CM | POA: Diagnosis present

## 2017-11-15 DIAGNOSIS — Z9981 Dependence on supplemental oxygen: Secondary | ICD-10-CM

## 2017-11-15 DIAGNOSIS — J209 Acute bronchitis, unspecified: Secondary | ICD-10-CM | POA: Diagnosis present

## 2017-11-15 DIAGNOSIS — Z9841 Cataract extraction status, right eye: Secondary | ICD-10-CM | POA: Diagnosis not present

## 2017-11-15 DIAGNOSIS — T45515A Adverse effect of anticoagulants, initial encounter: Secondary | ICD-10-CM

## 2017-11-15 DIAGNOSIS — Z885 Allergy status to narcotic agent status: Secondary | ICD-10-CM

## 2017-11-15 DIAGNOSIS — Z7901 Long term (current) use of anticoagulants: Secondary | ICD-10-CM | POA: Diagnosis not present

## 2017-11-15 DIAGNOSIS — Z800 Family history of malignant neoplasm of digestive organs: Secondary | ICD-10-CM

## 2017-11-15 DIAGNOSIS — I5043 Acute on chronic combined systolic (congestive) and diastolic (congestive) heart failure: Secondary | ICD-10-CM

## 2017-11-15 DIAGNOSIS — Z833 Family history of diabetes mellitus: Secondary | ICD-10-CM

## 2017-11-15 DIAGNOSIS — Z8679 Personal history of other diseases of the circulatory system: Secondary | ICD-10-CM | POA: Diagnosis not present

## 2017-11-15 DIAGNOSIS — D6832 Hemorrhagic disorder due to extrinsic circulating anticoagulants: Secondary | ICD-10-CM | POA: Diagnosis not present

## 2017-11-15 DIAGNOSIS — Z79890 Hormone replacement therapy (postmenopausal): Secondary | ICD-10-CM

## 2017-11-15 DIAGNOSIS — Z6833 Body mass index (BMI) 33.0-33.9, adult: Secondary | ICD-10-CM

## 2017-11-15 DIAGNOSIS — I5023 Acute on chronic systolic (congestive) heart failure: Secondary | ICD-10-CM | POA: Diagnosis present

## 2017-11-15 DIAGNOSIS — Z886 Allergy status to analgesic agent status: Secondary | ICD-10-CM

## 2017-11-15 DIAGNOSIS — Z880 Allergy status to penicillin: Secondary | ICD-10-CM

## 2017-11-15 DIAGNOSIS — J309 Allergic rhinitis, unspecified: Secondary | ICD-10-CM | POA: Diagnosis present

## 2017-11-15 DIAGNOSIS — R600 Localized edema: Secondary | ICD-10-CM | POA: Diagnosis not present

## 2017-11-15 DIAGNOSIS — J449 Chronic obstructive pulmonary disease, unspecified: Secondary | ICD-10-CM | POA: Diagnosis present

## 2017-11-15 DIAGNOSIS — I5022 Chronic systolic (congestive) heart failure: Secondary | ICD-10-CM | POA: Diagnosis present

## 2017-11-15 DIAGNOSIS — K5900 Constipation, unspecified: Secondary | ICD-10-CM | POA: Diagnosis present

## 2017-11-15 DIAGNOSIS — Z7951 Long term (current) use of inhaled steroids: Secondary | ICD-10-CM

## 2017-11-15 DIAGNOSIS — Z87891 Personal history of nicotine dependence: Secondary | ICD-10-CM | POA: Diagnosis not present

## 2017-11-15 DIAGNOSIS — Z8249 Family history of ischemic heart disease and other diseases of the circulatory system: Secondary | ICD-10-CM

## 2017-11-15 DIAGNOSIS — R0602 Shortness of breath: Secondary | ICD-10-CM | POA: Diagnosis not present

## 2017-11-15 DIAGNOSIS — R05 Cough: Secondary | ICD-10-CM | POA: Diagnosis not present

## 2017-11-15 LAB — CBC
HCT: 30.3 % — ABNORMAL LOW (ref 36.0–46.0)
Hemoglobin: 9.3 g/dL — ABNORMAL LOW (ref 12.0–15.0)
MCH: 29.7 pg (ref 26.0–34.0)
MCHC: 30.7 g/dL (ref 30.0–36.0)
MCV: 96.8 fL (ref 78.0–100.0)
Platelets: 205 10*3/uL (ref 150–400)
RBC: 3.13 MIL/uL — ABNORMAL LOW (ref 3.87–5.11)
RDW: 17 % — ABNORMAL HIGH (ref 11.5–15.5)
WBC: 6.8 10*3/uL (ref 4.0–10.5)

## 2017-11-15 LAB — BASIC METABOLIC PANEL
Anion gap: 10 (ref 5–15)
BUN: 67 mg/dL — ABNORMAL HIGH (ref 6–20)
CO2: 26 mmol/L (ref 22–32)
Calcium: 9.5 mg/dL (ref 8.9–10.3)
Chloride: 100 mmol/L — ABNORMAL LOW (ref 101–111)
Creatinine, Ser: 2.34 mg/dL — ABNORMAL HIGH (ref 0.44–1.00)
GFR calc Af Amer: 21 mL/min — ABNORMAL LOW (ref >60)
GFR calc non Af Amer: 18 mL/min — ABNORMAL LOW (ref >60)
Glucose, Bld: 111 mg/dL — ABNORMAL HIGH (ref 65–99)
Potassium: 5.2 mmol/L — ABNORMAL HIGH (ref 3.5–5.1)
Sodium: 136 mmol/L (ref 135–145)

## 2017-11-15 LAB — URINALYSIS, ROUTINE W REFLEX MICROSCOPIC
Bilirubin Urine: NEGATIVE
Glucose, UA: NEGATIVE mg/dL
Hgb urine dipstick: NEGATIVE
Ketones, ur: NEGATIVE mg/dL
Leukocytes, UA: NEGATIVE
Nitrite: NEGATIVE
Protein, ur: NEGATIVE mg/dL
Specific Gravity, Urine: 1.006 (ref 1.005–1.030)
pH: 5 (ref 5.0–8.0)

## 2017-11-15 LAB — DIGOXIN LEVEL: Digoxin Level: 0.8 ng/mL (ref 0.8–2.0)

## 2017-11-15 LAB — BRAIN NATRIURETIC PEPTIDE: B Natriuretic Peptide: 184 pg/mL — ABNORMAL HIGH (ref 0.0–100.0)

## 2017-11-15 LAB — PROTIME-INR
INR: 2.58
Prothrombin Time: 27.5 seconds — ABNORMAL HIGH (ref 11.4–15.2)

## 2017-11-15 LAB — I-STAT TROPONIN, ED: Troponin i, poc: 0.02 ng/mL (ref 0.00–0.08)

## 2017-11-15 MED ORDER — DOCUSATE SODIUM 100 MG PO CAPS
300.0000 mg | ORAL_CAPSULE | Freq: Every day | ORAL | Status: DC
Start: 2017-11-15 — End: 2017-11-17
  Administered 2017-11-15 – 2017-11-16 (×2): 300 mg via ORAL
  Filled 2017-11-15 (×2): qty 3

## 2017-11-15 MED ORDER — CALCIUM CARBONATE 1250 (500 CA) MG PO TABS
1250.0000 mg | ORAL_TABLET | Freq: Every day | ORAL | Status: DC
  Administered 2017-11-15: 1250 mg via ORAL
  Filled 2017-11-15 (×3): qty 1

## 2017-11-15 MED ORDER — FUROSEMIDE 10 MG/ML IJ SOLN
40.0000 mg | Freq: Every day | INTRAMUSCULAR | Status: DC
  Administered 2017-11-15 – 2017-11-16 (×2): 40 mg via INTRAVENOUS
  Filled 2017-11-15 (×2): qty 4

## 2017-11-15 MED ORDER — VITAMIN D 1000 UNITS PO TABS
1000.0000 [IU] | ORAL_TABLET | Freq: Every day | ORAL | Status: DC
  Administered 2017-11-15 – 2017-11-17 (×3): 1000 [IU] via ORAL
  Filled 2017-11-15 (×3): qty 1

## 2017-11-15 MED ORDER — DIGOXIN 125 MCG PO TABS
0.1250 mg | ORAL_TABLET | ORAL | Status: DC
  Administered 2017-11-16: 0.125 mg via ORAL
  Filled 2017-11-15 (×2): qty 1

## 2017-11-15 MED ORDER — PREDNISONE 20 MG PO TABS
50.0000 mg | ORAL_TABLET | Freq: Every day | ORAL | Status: DC
  Administered 2017-11-16 – 2017-11-17 (×2): 50 mg via ORAL
  Filled 2017-11-15 (×2): qty 2

## 2017-11-15 MED ORDER — WARFARIN SODIUM 5 MG PO TABS
5.0000 mg | ORAL_TABLET | Freq: Once | ORAL | Status: AC
  Administered 2017-11-15: 5 mg via ORAL
  Filled 2017-11-15: qty 1

## 2017-11-15 MED ORDER — METOPROLOL SUCCINATE ER 50 MG PO TB24
100.0000 mg | ORAL_TABLET | Freq: Every day | ORAL | Status: DC
  Administered 2017-11-15 – 2017-11-17 (×3): 100 mg via ORAL
  Filled 2017-11-15 (×3): qty 2

## 2017-11-15 MED ORDER — POLYETHYL GLYCOL-PROPYL GLYCOL 0.4-0.3 % OP GEL
Freq: Every day | OPHTHALMIC | Status: DC | PRN
  Filled 2017-11-15: qty 10

## 2017-11-15 MED ORDER — SODIUM CHLORIDE 0.9% FLUSH
3.0000 mL | Freq: Two times a day (BID) | INTRAVENOUS | Status: DC
  Administered 2017-11-15 – 2017-11-16 (×3): 3 mL via INTRAVENOUS

## 2017-11-15 MED ORDER — ALLOPURINOL 100 MG PO TABS
100.0000 mg | ORAL_TABLET | Freq: Every day | ORAL | Status: DC
  Administered 2017-11-16 – 2017-11-17 (×2): 100 mg via ORAL
  Filled 2017-11-15 (×2): qty 1

## 2017-11-15 MED ORDER — SODIUM CHLORIDE 0.9 % IV SOLN: 250.0000 mL | INTRAVENOUS | Status: DC | PRN

## 2017-11-15 MED ORDER — HYDROCODONE-ACETAMINOPHEN 7.5-325 MG PO TABS
1.0000 | ORAL_TABLET | Freq: Four times a day (QID) | ORAL | Status: DC | PRN
  Administered 2017-11-16: 1 via ORAL
  Filled 2017-11-15: qty 1

## 2017-11-15 MED ORDER — ONDANSETRON HCL 4 MG/2ML IJ SOLN: 4.0000 mg | Freq: Four times a day (QID) | INTRAMUSCULAR | Status: DC | PRN

## 2017-11-15 MED ORDER — BUDESONIDE 0.5 MG/2ML IN SUSP
0.5000 mg | Freq: Two times a day (BID) | RESPIRATORY_TRACT | Status: DC
  Administered 2017-11-15 – 2017-11-17 (×4): 0.5 mg via RESPIRATORY_TRACT
  Filled 2017-11-15 (×4): qty 2

## 2017-11-15 MED ORDER — FUROSEMIDE 10 MG/ML IJ SOLN
40.0000 mg | Freq: Once | INTRAMUSCULAR | Status: AC
  Administered 2017-11-15: 40 mg via INTRAVENOUS
  Filled 2017-11-15: qty 4

## 2017-11-15 MED ORDER — PRAVASTATIN SODIUM 40 MG PO TABS
40.0000 mg | ORAL_TABLET | Freq: Every evening | ORAL | Status: DC
  Administered 2017-11-15 – 2017-11-16 (×2): 40 mg via ORAL
  Filled 2017-11-15 (×2): qty 1

## 2017-11-15 MED ORDER — ACETAMINOPHEN 325 MG PO TABS: 650.0000 mg | ORAL_TABLET | ORAL | Status: DC | PRN

## 2017-11-15 MED ORDER — WARFARIN - PHARMACIST DOSING INPATIENT
Status: DC
  Administered 2017-11-16: 17:00:00

## 2017-11-15 MED ORDER — SODIUM CHLORIDE 0.9% FLUSH: 3.0000 mL | INTRAVENOUS | Status: DC | PRN

## 2017-11-15 MED ORDER — HYDRALAZINE HCL 25 MG PO TABS
25.0000 mg | ORAL_TABLET | Freq: Three times a day (TID) | ORAL | Status: DC
  Administered 2017-11-15 – 2017-11-17 (×5): 25 mg via ORAL
  Filled 2017-11-15 (×5): qty 1

## 2017-11-15 MED ORDER — LEVOTHYROXINE SODIUM 75 MCG PO TABS
75.0000 ug | ORAL_TABLET | Freq: Every day | ORAL | Status: DC
  Administered 2017-11-16 – 2017-11-17 (×2): 75 ug via ORAL
  Filled 2017-11-15 (×2): qty 1

## 2017-11-15 MED ORDER — ISOSORBIDE MONONITRATE ER 60 MG PO TB24
60.0000 mg | ORAL_TABLET | Freq: Every day | ORAL | Status: DC
  Administered 2017-11-15 – 2017-11-17 (×3): 60 mg via ORAL
  Filled 2017-11-15 (×3): qty 1

## 2017-11-15 MED ORDER — FLUTICASONE PROPIONATE 50 MCG/ACT NA SUSP
2.0000 | Freq: Every day | NASAL | Status: DC
  Administered 2017-11-15 – 2017-11-17 (×3): 2 via NASAL
  Filled 2017-11-15 (×2): qty 16

## 2017-11-15 MED ORDER — GABAPENTIN 300 MG PO CAPS
300.0000 mg | ORAL_CAPSULE | Freq: Every day | ORAL | Status: DC
Start: 2017-11-15 — End: 2017-11-17
  Administered 2017-11-15 – 2017-11-16 (×2): 300 mg via ORAL
  Filled 2017-11-15 (×2): qty 1

## 2017-11-15 MED ORDER — IPRATROPIUM-ALBUTEROL 0.5-2.5 (3) MG/3ML IN SOLN
3.0000 mL | Freq: Four times a day (QID) | RESPIRATORY_TRACT | Status: DC
  Administered 2017-11-15 – 2017-11-17 (×7): 3 mL via RESPIRATORY_TRACT
  Filled 2017-11-15 (×8): qty 3

## 2017-11-15 MED ORDER — FOLIC ACID 1 MG PO TABS
1.0000 mg | ORAL_TABLET | Freq: Every day | ORAL | Status: DC
  Administered 2017-11-15 – 2017-11-17 (×3): 1 mg via ORAL
  Filled 2017-11-15 (×3): qty 1

## 2017-11-15 NOTE — ED Provider Notes (Addendum)
[facility] Provider Note   CSN: [id number] Arrival date & time: 11/15/17  4098     History   Chief Complaint Chief Complaint  Patient presents with  . Weakness    HPI [name] [name] is a 84 y.o. female.  HPI Patient recently hospitalized for influenza presents with increasing shortness of breath, generalized weakness, lower extremity swelling starting this morning around 0300.  She denies chest pain.  States she has persistent nonproductive cough.  Endorses diaphoresis but no known fever.  Patient states she wears 2 L of oxygen at night.   Past Medical History:  Diagnosis Date  . Adenomatous polyp of colon 12/16/2002   Dr. [name]/[facility]  . Allergy   . Cataract   . CHB (complete heart block) ([place]) 10/07/2014      . CHF (congestive heart failure) ([place])       . Chronic kidney disease, stage I 2006  . Constipation       . COPD (chronic obstructive pulmonary disease) ([place])       . DJD (degenerative joint disease) of lumbar spine   . DM (diabetes mellitus) ([place]) 2006  . Esophageal reflux   . Glaucoma   . Gout       . Heart murmur   . Hyperlipemia   . IDA (iron deficiency anemia)    Parenteral iron/[name]  . Insomnia       . Non-ischemic cardiomyopathy ([place])    a. s/p MDT CRTD  . Obesity, unspecified   . Other and unspecified hyperlipidemia   . Oxygen deficiency 2014   nocturnal;  . Spondylolisthesis   . Spondylosis   . Thyroid cancer ([place])    remote thyroidectomy, no recurrence, pt denies in 2016 thyroid cancer  . Unspecified hypothyroidism       . 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 Vertigo         Patient Active Problem List   Diagnosis Date Noted  . Acute on chronic systolic CHF (congestive heart failure) ([place]) 11/15/2017  . COPD with acute exacerbation ([place]) 11/15/2017  . Hospital discharge follow-up 11/07/2017  . Influenza 11/01/2017  . Trochanteric  bursitis, right hip 03/07/2017  . SOB (shortness of breath) 11/16/2016  . Persistent atrial fibrillation ([place])   . Nocturnal hypoxia 10/08/2016  . Dependence on nocturnal oxygen therapy 10/08/2016  . S/P ICD (internal cardiac defibrillator) procedure 02/08/2015  . CHB (complete heart block) ([place]) 10/07/2014  . Fatigue 07/20/2014  . Anemia 06/24/2014  . Osteopenia 02/10/2014  . Encounter for therapeutic drug monitoring 10/29/2013  . Spinal stenosis of lumbar region 02/19/2013  . OA (osteoarthritis) of knee 02/19/2013  . Chronic pain of right knee 01/02/2013  . Hemolytic anemia ([place]) 09/24/2012  . H/O adenomatous polyp of colon 05/24/2012  . High risk medication use 05/24/2012  . COPD (chronic obstructive pulmonary disease) ([place]) 05/19/2012  . Chronic systolic heart failure ([place]) 05/19/2012  . Chronic anticoagulation, on coumadin for Mechanical AVR and MVR 04/01/2012  . Cardiorenal syndrome with renal failure 03/22/2012  . S/P AVR (aortic valve replacement) 03/22/2012  . S/P MVR (mitral valve replacement) 03/22/2012  . AICD (automatic cardioverter/defibrillator) present 03/22/2012  . CKD (chronic kidney disease), stage IV ([place]) 03/22/2012  . Acute respiratory failure with hypoxia ([place]) 03/21/2012  . Warfarin-induced coagulopathy ([place]) 03/21/2012  . Papilloma of breast 03/06/2012  . Iron deficiency 10/04/2011  . Allergic rhinitis 02/14/2011  . Nausea without vomiting 06/07/2010  .  HIP PAIN, RIGHT 04/23/2009  . VARICOSE VEINS LOWER EXTREMITIES W/OTH COMPS 02/09/2009  . Calculus of gallbladder with chronic cholecystitis without obstruction 02/09/2009  . Prediabetes 06/15/2008  . Hypothyroidism, postsurgical 02/24/2008  . Hyperlipidemia LDL goal <70 02/24/2008  . Mild obesity 02/24/2008  . Essential hypertension 02/24/2008  . Non-ischemic cardiomyopathy with ICD 02/24/2008  . GERD 02/24/2008  . DEGENERATIVE JOINT DISEASE, SPINE 02/24/2008    Past Surgical History:  Procedure  Laterality Date  . AORTIC AND MITRAL VALVE REPLACEMENT  2001  . BI-VENTRICULAR IMPLANTABLE CARDIOVERTER DEFIBRILLATOR UPGRADE N/A 01/18/2015   Procedure: BI-VENTRICULAR IMPLANTABLE CARDIOVERTER DEFIBRILLATOR UPGRADE;  Surgeon: Evans Lance, MD;  Location: [facility] CATH LAB;  Service: Cardiovascular;  Laterality: N/A;  . CARDIAC CATHETERIZATION    . CARDIAC VALVE REPLACEMENT    . CARDIOVERSION N/A 11/13/2016   Procedure: CARDIOVERSION;  Surgeon: Sanda Klein, MD;  Location: [facility] ENDOSCOPY;  Service: Cardiovascular;  Laterality: N/A;  . COLONOSCOPY  06/12/2007   [place]k- Normal rectum/Normal colon  . COLONOSCOPY  12/16/02   small hemorrhoids  . COLONOSCOPY  06/20/2012   Procedure: COLONOSCOPY;  Surgeon: Daneil Dolin, MD;  Location: [facility] ENDO SUITE;  Service: Endoscopy;  Laterality: N/A;  10:45  . defibrillator implanted 2006    . DOPPLER ECHOCARDIOGRAPHY  2012  . DOPPLER ECHOCARDIOGRAPHY  05,06,07,08,09,2011  . ESOPHAGOGASTRODUODENOSCOPY  06/12/2007   Rourk- normal esophagus, small hiatal hernia, otherwise normal stomach, D1, D2  . EYE SURGERY Right 2005  . EYE SURGERY Left 2006  . ICD LEAD REMOVAL Left 02/08/2015   Procedure: ICD LEAD REMOVAL;  Surgeon: Evans Lance, MD;  Location[facility] OR;  Service: Cardiovascular;  Laterality: Left;  "Will plan extraction and insertion of a BiV PM"  **Dr. Roxan Hockey backing up case**  . IMPLANTABLE CARDIOVERTER DEFIBRILLATOR (ICD) GENERATOR CHANGE Left 02/08/2015   Procedure: ICD GENERATOR CHANGE;  Surgeon: Evans Lance, MD;  Location: [facility];  Service: Cardiovascular;  Laterality: Left;  . INSERT / REPLACE / REMOVE PACEMAKER    . PACEMAKER INSERTION  May 2016  . pacemaker placed  2004  . right breast cyst removed     benign   . right cataract removed     2005  . TEE WITHOUT CARDIOVERSION N/A 11/13/2016   Procedure: TRANSESOPHAGEAL ECHOCARDIOGRAM (TEE);  Surgeon: Sanda Klein, MD;  Location: [facility] ENDOSCOPY;  Service: Cardiovascular;  Laterality: N/A;  .  THYROIDECTOMY      OB History    Gravida Para Term Preterm AB Living             0   SAB TAB Ectopic Multiple Live Births                   Home Medications    Prior to Admission medications   Medication Sig Start Date End Date Taking? Authorizing Provider  acetaminophen (TYLENOL) 500 MG tablet Take 500 mg by mouth every 6 (six) hours as needed (pain).   Yes [provider]  albuterol (PROVENTIL) (2.5 MG/3ML) 0.083% nebulizer solution INHALE 1 VIAL VIA NEBULIZER THREE TIMES DAILY. Patient taking differently: INHALE 1 VIAL VIA NEBULIZER THREE TIMES DAILY AS NEEDED 12/28/16  Yes Fayrene Helper, MD  albuterol (VENTOLIN HFA) 108 (90 Base) MCG/ACT inhaler Inhale 2 puffs into the lungs every 6 (six) hours as needed for wheezing or shortness of breath. 10/19/16  Yes Fayrene Helper, MD  allopurinol (ZYLOPRIM) 100 MG tablet Take 1 tablet (100 mg total) by mouth daily. 10/18/17  Yes Fayrene Helper,  MD  azelastine (ASTELIN) 0.1 % nasal spray Place 2 sprays into both nostrils 2 (two) times daily. Use in each nostril as directed Patient taking differently: Place 2 sprays into both nostrils daily as needed for allergies. Use in each nostril as directed 10/04/15  Yes Fayrene Helper, MD  BREO ELLIPTA 100-25 MCG/INH AEPB Inhale 1 puff into the lungs daily.  10/04/17  Yes [provider]  calcium carbonate (OS-CAL) 1250 (500 Ca) MG chewable tablet Chew 1 tablet by mouth daily. 07/15/10  Yes [provider]  cholecalciferol (VITAMIN D) 1000 UNITS tablet Take 1,000 Units by mouth daily.   Yes [provider]  COUMADIN 5 MG tablet Take 2 tablets daily except 1 tablet on Tuesdays Patient taking differently: Take 1 tablet Tuesday, Thursday, and Saturday and  Take 2 tablets all other days. 09/14/17  Yes Evans Lance, MD  Darbepoetin Alfa (ARANESP, ALBUMIN FREE, IJ) EVERY THURSDAY 07/15/10  Yes [provider]  digoxin (LANOXIN) 0.125 MG tablet Take 1  tablet (0.125 mg total) by mouth every other day. 07/23/17  Yes Lendon Colonel, NP  docusate sodium (COLACE) 100 MG capsule Take 300 mg by mouth at bedtime.   Yes [provider]  fluticasone (FLONASE) 50 MCG/ACT nasal spray USE 2 SPRAYS IN EACH       NOSTRIL ONCE DAILY Patient taking differently: USE 2 SPRAYS IN EACH       NOSTRIL ONCE DAILY AS NEEDED FOR CONGESTION 10/25/16  Yes Fayrene Helper, MD  folic acid (FOLVITE) 1 MG tablet Take 1 tablet (1 mg total) by mouth daily. 05/01/17  Yes Fayrene Helper, MD  furosemide (LASIX) 40 MG tablet Take 1 tablet by mouth  daily 07/23/17  Yes Lendon Colonel, NP  hydrALAZINE (APRESOLINE) 25 MG tablet Take 1 tablet (25 mg total) by mouth 3 (three) times daily. 07/23/17  Yes Lendon Colonel, NP  HYDROcodone-acetaminophen (NORCO) 7.5-325 MG tablet Take 1 tablet by mouth every 6 (six) hours as needed for moderate pain (Must last 30 days.). 10/24/17  Yes Sanjuana Kava, MD  isosorbide mononitrate (IMDUR) 60 MG 24 hr tablet Take 1 tablet (60 mg total) by mouth daily. 11/06/17  Yes Croitoru, Mihai, MD  meclizine (ANTIVERT) 25 MG tablet Take 1 tablet (25 mg total) by mouth 3 (three) times daily as needed for dizziness. 05/01/17  Yes Fayrene Helper, MD  metolazone (ZAROXOLYN) 2.5 MG tablet TAKE 1 TABLET ONCE A WEEK 07/23/17  Yes Lendon Colonel, NP  ondansetron (ZOFRAN) 4 MG tablet Take 1 tablet (4 mg total) by mouth daily as needed for nausea or vomiting. 05/01/17  Yes Fayrene Helper, MD  Polyethyl [name] (SYSTANE OP) Place 1 drop into both eyes daily as needed (dry eyes).   Yes [provider]  potassium chloride SA (KLOR-CON M20) 20 MEQ tablet Take 1 tablet by mouth  daily 07/23/17  Yes Lendon Colonel, NP  pravastatin (PRAVACHOL) 40 MG tablet Take 1 tablet (40 mg total) by mouth every evening. 07/23/17 11/01/18 Yes Lendon Colonel, NP  SYNTHROID 75 MCG tablet Take 1 tablet by mouth  daily 10/18/17   Yes Fayrene Helper, MD  TOPROL XL 100 MG 24 hr tablet Take 1 tablet (100 mg total) by mouth daily. Take with or immediately following a meal. 10/18/17  Yes Fayrene Helper, MD  gabapentin (NEURONTIN) 300 MG capsule Take 1 capsule (300 mg total) by mouth at bedtime. 11/19/17   Fayrene Helper,  MD  guaiFENesin (MUCINEX) 600 MG 12 hr tablet Take 1 tablet (600 mg total) by mouth 2 (two) times daily. 11/17/17 11/17/18  Kathie Dike, MD  predniSONE (DELTASONE) 10 MG tablet Take 40mg  po daily for 2 days then 30mg  daily for 2 days then 20mg  daily for 2 days then 10mg  daily for 2 days then stop 11/17/17   Kathie Dike, MD    Family History Family History  Problem Relation Age of Onset  . Pancreatic cancer Mother   . Heart disease Father   . Heart disease Sister   . Heart attack Sister   . Heart disease Brother   . Diabetes Brother   . Heart disease Brother   . Diabetes Brother   . Hypertension Brother   . Lung cancer Brother     Social History Social History   Tobacco Use  . Smoking status: Former Smoker    Packs/day: 0.75    Years: 40.00    Pack years: 30.00    Types: Cigarettes    Last attempt to quit: 03/08/1987    Years since quitting: 30.7  . Smokeless tobacco: Never Used  Substance Use Topics  . Alcohol use: No    Alcohol/week: 0.0 oz  . Drug use: No     Allergies   Penicillins; Aspirin; Fentanyl; and Niacin   Review of Systems Review of Systems  Constitutional: Positive for diaphoresis. Negative for fever.  Respiratory: Positive for cough and shortness of breath.   Cardiovascular: Positive for leg swelling. Negative for chest pain and palpitations.  Gastrointestinal: Negative for abdominal pain, constipation, diarrhea, nausea and vomiting.  Genitourinary: Negative for dysuria and flank pain.  Musculoskeletal: Negative for back pain, myalgias and neck pain.  Skin: Negative for rash and wound.  Neurological: Positive for weakness. Negative for dizziness,  light-headedness, numbness and headaches.  All other systems reviewed and are negative.    Physical Exam Updated Vital Signs BP (!) 111/38 (BP Location: Right Arm)   Pulse 70   Temp 98.2 F (36.8 C) (Oral)   Resp 18   Ht 5\' 7"  (1.702 m)   Wt 97 kg (213 lb 13.5 oz)   SpO2 95%   BMI 33.49 kg/m   Physical Exam  Constitutional: She is oriented to person, place, and time. She appears well-developed and well-nourished. No distress.  HENT:  Head: Normocephalic and atraumatic.  Mouth/Throat: Oropharynx is clear and moist. No oropharyngeal exudate.  Eyes: EOM are normal. Pupils are equal, round, and reactive to light.  Neck: Normal range of motion. Neck supple. JVD present.  Cardiovascular: Normal rate and regular rhythm. Exam reveals no gallop and no friction rub.  No murmur heard. Pulmonary/Chest: Effort normal.  Diminished breath sounds at bilateral bases.  Abdominal: Soft. Bowel sounds are normal. There is no tenderness. There is no rebound and no guarding.  Musculoskeletal: Normal range of motion. She exhibits edema. She exhibits no tenderness.  1+ bilateral lower extremity pitting edema.  No asymmetry or tenderness.  Neurological: She is alert and oriented to person, place, and time.  Moves all extremities without focal deficit.  Sensation intact.  Skin: Skin is warm and dry. No rash noted. No erythema.  Psychiatric: She has a normal mood and affect. Her behavior is normal.  Nursing note and vitals reviewed.    ED Treatments / Results  Labs (all labs ordered are listed, but only abnormal results are displayed) Labs Reviewed  BASIC METABOLIC PANEL - Abnormal; Notable for the following components:  Result Value   Potassium 5.2 (*)    Chloride 100 (*)    Glucose, Bld 111 (*)    BUN 67 (*)    Creatinine, Ser 2.34 (*)    GFR calc non Af Amer 18 (*)    GFR calc Af Amer 21 (*)    All other components within normal limits  CBC - Abnormal; Notable for the following  components:   RBC 3.13 (*)    Hemoglobin 9.3 (*)    HCT 30.3 (*)    RDW 17.0 (*)    All other components within normal limits  BRAIN NATRIURETIC PEPTIDE - Abnormal; Notable for the following components:   B Natriuretic Peptide 184.0 (*)    All other components within normal limits  PROTIME-INR - Abnormal; Notable for the following components:   Prothrombin Time 27.5 (*)    All other components within normal limits  URINALYSIS, ROUTINE W REFLEX MICROSCOPIC - Abnormal; Notable for the following components:   Color, Urine STRAW (*)    All other components within normal limits  PROTIME-INR - Abnormal; Notable for the following components:   Prothrombin Time 30.5 (*)    All other components within normal limits  BASIC METABOLIC PANEL - Abnormal; Notable for the following components:   Chloride 98 (*)    BUN 68 (*)    Creatinine, Ser 2.44 (*)    GFR calc non Af Amer 17 (*)    GFR calc Af Amer 20 (*)    All other components within normal limits  BASIC METABOLIC PANEL - Abnormal; Notable for the following components:   Chloride 98 (*)    Glucose, Bld 123 (*)    BUN 79 (*)    Creatinine, Ser 2.36 (*)    GFR calc non Af Amer 18 (*)    GFR calc Af Amer 21 (*)    All other components within normal limits  PROTIME-INR - Abnormal; Notable for the following components:   Prothrombin Time 32.5 (*)    All other components within normal limits  RESPIRATORY PANEL BY PCR  DIGOXIN LEVEL  DIGOXIN LEVEL  I-STAT TROPONIN, ED    EKG  EKG Interpretation None       Radiology No results found.  Procedures Procedures (including critical care time)  Medications Ordered in ED Medications  furosemide (LASIX) injection 40 mg (40 mg Intravenous Given 11/15/17 1357)  warfarin (COUMADIN) tablet 5 mg (5 mg Oral Given 11/15/17 1700)  warfarin (COUMADIN) tablet 2.5 mg (2.5 mg Oral Given 11/16/17 1625)     Initial Impression / Assessment and Plan / ED Course  I have reviewed the triage vital  signs and the nursing notes.  Pertinent labs & imaging results that were available during my care of the patient were reviewed by me and considered in my medical decision making (see chart for details).     Evidence of fluid overload clinically.  Mild elevation in BNP.  Given dose of IV Lasix.  Attempted to ambulate patient became very dyspneic.  Complaining of generalized weakness.  Discussed with hospitalist will see patient in the emergency department and admit.  Final Clinical Impressions(s) / ED Diagnoses   Final diagnoses:  Acute on chronic combined systolic and diastolic congestive heart failure [facility])    ED Discharge Orders        Ordered    predniSONE (DELTASONE) 10 MG tablet     11/17/17 1528    guaiFENesin (MUCINEX) 600 MG 12 hr tablet  2 times daily  11/17/17 1528    Increase activity slowly     11/17/17 1528    Diet - low sodium heart healthy     11/17/17 1528       Julianne Rice, MD 11/15/17 1530    Julianne Rice, MD 11/23/17 1538

## 2017-11-15 NOTE — Progress Notes (Addendum)
ANTICOAGULATION CONSULT NOTE - Initial Consult  Pharmacy Consult for Warfarin (home med) Indication: mechanical valve  Allergies  Allergen Reactions  . Penicillins Other (See Comments)    Bruise Has patient had a PCN reaction causing immediate rash, facial/tongue/throat swelling, SOB or lightheadedness with hypotension: no Has patient had a PCN reaction causing severe rash involving mucus membranes or skin necrosis: no Has patient had a PCN reaction that required hospitalization: no Has patient had a PCN reaction occurring within the last 10 years: no If all of the above answers are "NO", then may proceed with Cephalosporin use.   . Aspirin Other (See Comments)    On coumadin  . Fentanyl Dermatitis    Rash with patch   . Niacin Itching   Patient Measurements: Height: 5\' 7"  (170.2 cm) Weight: 215 lb (97.5 kg) IBW/kg (Calculated) : 61.6  Vital Signs: Temp: 98.3 F (36.8 C) (02/21 0911) Temp Source: Oral (02/21 0911) BP: 124/54 (02/21 1600) Pulse Rate: 72 (02/21 1600)  Labs: Recent Labs    11/15/17 0951  HGB 9.3*  HCT 30.3*  PLT 205  LABPROT 27.5*  INR 2.58  CREATININE 2.34*   Estimated Creatinine Clearance: 21.9 mL/min (A) (by C-G formula based on SCr of 2.34 mg/dL (H)).  Medical History: Past Medical History:  Diagnosis Date  . Adenomatous polyp of colon 12/16/2002   Dr. [name]/[facility]  . Allergy   . Cataract   . CHB (complete heart block) ([place]) 10/07/2014      . CHF (congestive heart failure) ([place])       . Chronic kidney disease, stage I 2006  . Constipation       . COPD (chronic obstructive pulmonary disease) ([place])       . DJD (degenerative joint disease) of lumbar spine   . DM (diabetes mellitus) ([place]) 2006  . Esophageal reflux   . Glaucoma   . Gout       . Heart murmur   . Hyperlipemia   . IDA (iron deficiency anemia)    Parenteral iron/[name]  . Insomnia       . Non-ischemic cardiomyopathy ([place])     a. s/p MDT CRTD  . Obesity, unspecified   . Other and unspecified hyperlipidemia   . Oxygen deficiency 2014   nocturnal;  . Spondylolisthesis   . Spondylosis   . Thyroid cancer ([place])    remote thyroidectomy, no recurrence, pt denies in 2016 thyroid cancer  . Unspecified hypothyroidism       . 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 Vertigo        Medications:   (Not in a hospital admission)  Assessment: 84yo female on chronic Coumadin PTA.  INR is therapeutic on admission.  Home dose listed on PTA med list, last reported dose was yesterday.   Goal of Therapy:  INR 2-3 (try to keep 2-2.5 per [facility] clinic) Monitor platelets by anticoagulation protocol: Yes   Plan:  Coumadin 5mg  today x 1 (home dose) INR daily  Hart Robinsons A 11/15/2017,4:12 PM

## 2017-11-15 NOTE — ED Triage Notes (Signed)
Patient complaining of generalized weakness, swelling in lower extremities, and shortness of breath starting at 0300 this morning.

## 2017-11-15 NOTE — H&P (Signed)
History and Physical  [name] [name] QQP:[id number] DOB: 23-Dec-1931 DOA: 11/15/2017   PCP: Fayrene Helper, MD   Patient coming from: Home  Chief Complaint: Dyspnea     HPI:  [name] [name] is a 84 y.o. female with medical history of CKD stage IV, systolic CHF, COPD, complete heart block  status post permanent pacemaker, hyperlipidemia presenting with 1 day history of shortness of breath that began on the evening of 11/14/2017.  Patient was recently discharged from the hospital after a stay from 11/01/17 through 11/02/17 during which she was treated for influenza.  Patient was discharged with oseltamivir.  She endorsed compliance with her oseltamivir.  In addition, the patient states that she has been compliant with all her medications at home including her furosemide and other cardiac medications.  She states that her weight has been fairly stable at 217 pounds at home, but notes that in the past 2-3 days she has noted increasing lower extremity edema.  She states that she has PND type symptoms.  She denies any fevers, chills, chest pain, vomiting, diarrhea.  She complains of a nonproductive cough but denies any hemoptysis.  She states that she has been using her nebulizer machine at home which gives her a little bit of relief, but her shortness of breath has been more persistent.  As result, the patient presented for further evaluation.  In the emergency department, the patient was afebrile hemodynamically stable saturating 91% on room air initially.  The patient was given furosemide 40 mg IV with improvement of her oxygen saturation to 95-99%.  Patient also stated that her breathing has improved since her stay in the emergency department.  BMP showed potassium 5.2, serum creatinine 2.34.  CBC was unremarkable with her  hemoglobin 9.3 at baseline.  Chest x-ray showed chronic interstitial prominence without consolidation.  Assessment/Plan: Acute on chronic systolic CHF -The patient appears  clinically fluid overloaded with lower extremity edema and JVD -Start furosemide 40 mg IV daily -Daily weights -Strict I's and O's -11/13/2016 TEE--EF 30-35%, PAS P 49, moderate TR -Repeat echo continue -Hydralazine, Imdur, metoprolol succinate -Holding losartan secondary to CKD stage IV -Continue digoxin.   -Check digoxin level  Dyspnea/cough -The patient has a mild degree of COPD exacerbation with scattered wheezing on exam--however CHF is likely the main driver of her dyspnea -Start prednisone 50 mg daily -Start bronchodilators every 6 -Start Pulmicort -Viral respiratory panel  Mechanical aortic valve and mitral valve -Continue Coumadin -Presenting INR 2.58  Leg pain and edema -Venous duplex rule out DVT  CKD stage IV -Baseline creatinine 1.7-2.1 -Monitor BMP with diuresis  Hyperlipidemia -Continue statin  Complete heart block -Status post ICD  Hyperkalemia -presented with potassium of 5.2 -anticipate improvement with lasix and discontinuing losartan -telemetry -am BMP      Past Medical History:  Diagnosis Date  . Adenomatous polyp of colon 12/16/2002   Dr. [name]/[facility]  . Allergy   . Cataract   . CHB (complete heart block) ([place]) 10/07/2014      . CHF (congestive heart failure) ([place])       . Chronic kidney disease, stage I 2006  . Constipation       . COPD (chronic obstructive pulmonary disease) ([place])       . DJD (degenerative joint disease) of lumbar spine   . DM (diabetes mellitus) ([place]) 2006  . Esophageal reflux   . Glaucoma   . Gout       .  Heart murmur   . Hyperlipemia   . IDA (iron deficiency anemia)    Parenteral iron/[name]  . Insomnia       . Non-ischemic cardiomyopathy ([place])    a. s/p MDT CRTD  . Obesity, unspecified   . Other and unspecified hyperlipidemia   . Oxygen deficiency 2014   nocturnal;  . Spondylolisthesis   . Spondylosis   . Thyroid cancer ([place])    remote thyroidectomy,  no recurrence, pt denies in 2016 thyroid cancer  . Unspecified hypothyroidism       . 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 Vertigo        Past Surgical History:  Procedure Laterality Date  . AORTIC AND MITRAL VALVE REPLACEMENT  2001  . BI-VENTRICULAR IMPLANTABLE CARDIOVERTER DEFIBRILLATOR UPGRADE N/A 01/18/2015   Procedure: BI-VENTRICULAR IMPLANTABLE CARDIOVERTER DEFIBRILLATOR UPGRADE;  Surgeon: Evans Lance, MD;  Location: [facility] CATH LAB;  Service: Cardiovascular;  Laterality: N/A;  . CARDIAC CATHETERIZATION    . CARDIAC VALVE REPLACEMENT    . CARDIOVERSION N/A 11/13/2016   Procedure: CARDIOVERSION;  Surgeon: Sanda Klein, MD;  Location: [facility] ENDOSCOPY;  Service: Cardiovascular;  Laterality: N/A;  . COLONOSCOPY  06/12/2007   Rourk- Normal rectum/Normal colon  . COLONOSCOPY  12/16/02   small hemorrhoids  . COLONOSCOPY  06/20/2012   Procedure: COLONOSCOPY;  Surgeon: Daneil Dolin, MD;  Location: [facility] ENDO SUITE;  Service: Endoscopy;  Laterality: N/A;  10:45  . defibrillator implanted 2006    . DOPPLER ECHOCARDIOGRAPHY  2012  . DOPPLER ECHOCARDIOGRAPHY  05,06,07,08,09,2011  . ESOPHAGOGASTRODUODENOSCOPY  06/12/2007   Rourk- normal esophagus, small hiatal hernia, otherwise normal stomach, D1, D2  . EYE SURGERY Right 2005  . EYE SURGERY Left 2006  . ICD LEAD REMOVAL Left 02/08/2015   Procedure: ICD LEAD REMOVAL;  Surgeon: Evans Lance, MD;  Location: [facility] OR;  Service: Cardiovascular;  Laterality: Left;  "Will plan extraction and insertion of a BiV PM"  **Dr. Roxan Hockey backing up case**  . IMPLANTABLE CARDIOVERTER DEFIBRILLATOR (ICD) GENERATOR CHANGE Left 02/08/2015   Procedure: ICD GENERATOR CHANGE;  Surgeon: Evans Lance, MD;  Location: [place];  Service: Cardiovascular;  Laterality: Left;  . INSERT / REPLACE / REMOVE PACEMAKER    . PACEMAKER INSERTION  May 2016  . pacemaker placed  2004  . right breast cyst removed     benign   . right cataract removed      2005  . TEE WITHOUT CARDIOVERSION N/A 11/13/2016   Procedure: TRANSESOPHAGEAL ECHOCARDIOGRAM (TEE);  Surgeon: Sanda Klein, MD;  Location: [facility] ENDOSCOPY;  Service: Cardiovascular;  Laterality: N/A;  . THYROIDECTOMY     Social History:  reports that she quit smoking about 30 years ago. Her smoking use included cigarettes. She has a 30.00 pack-year smoking history. she has never used smokeless tobacco. She reports that she does not drink alcohol or use drugs.   Family History  Problem Relation Age of Onset  . Pancreatic cancer Mother   . Heart disease Father   . Heart disease Sister   . Heart attack Sister   . Heart disease Brother   . Diabetes Brother   . Heart disease Brother   . Diabetes Brother   . Hypertension Brother   . Lung cancer Brother      Allergies  Allergen Reactions  . Penicillins Other (See Comments)    Bruise Has patient had a PCN reaction causing immediate rash, facial/tongue/throat swelling, SOB or lightheadedness with hypotension:  no Has patient had a PCN reaction causing severe rash involving mucus membranes or skin necrosis: no Has patient had a PCN reaction that required hospitalization: no Has patient had a PCN reaction occurring within the last 10 years: no If all of the above answers are "NO", then may proceed with Cephalosporin use.   . Aspirin Other (See Comments)    On coumadin  . Fentanyl Dermatitis    Rash with patch   . Niacin Itching     Prior to Admission medications   Medication Sig Start Date End Date Taking? Authorizing Provider  acetaminophen (TYLENOL) 500 MG tablet Take 500 mg by mouth every 6 (six) hours as needed (pain).   Yes [provider]  albuterol (PROVENTIL) (2.5 MG/3ML) 0.083% nebulizer solution INHALE 1 VIAL VIA NEBULIZER THREE TIMES DAILY. Patient taking differently: INHALE 1 VIAL VIA NEBULIZER THREE TIMES DAILY AS NEEDED 12/28/16  Yes Fayrene Helper, MD  albuterol (VENTOLIN HFA) 108 (90 Base) MCG/ACT inhaler  Inhale 2 puffs into the lungs every 6 (six) hours as needed for wheezing or shortness of breath. 10/19/16  Yes Fayrene Helper, MD  allopurinol (ZYLOPRIM) 100 MG tablet Take 1 tablet (100 mg total) by mouth daily. 10/18/17  Yes Fayrene Helper, MD  azelastine (ASTELIN) 0.1 % nasal spray Place 2 sprays into both nostrils 2 (two) times daily. Use in each nostril as directed Patient taking differently: Place 2 sprays into both nostrils daily as needed for allergies. Use in each nostril as directed 10/04/15  Yes Fayrene Helper, MD  BREO ELLIPTA 100-25 MCG/INH AEPB Inhale 1 puff into the lungs daily.  10/04/17  Yes [provider]  calcium carbonate (OS-CAL) 1250 (500 Ca) MG chewable tablet Chew 1 tablet by mouth daily. 07/15/10  Yes [provider]  cholecalciferol (VITAMIN D) 1000 UNITS tablet Take 1,000 Units by mouth daily.   Yes [provider]  COUMADIN 5 MG tablet Take 2 tablets daily except 1 tablet on Tuesdays Patient taking differently: Take 1 tablet Tuesday, Thursday, and Saturday and  Take 2 tablets all other days. 09/14/17  Yes Evans Lance, MD  Darbepoetin Alfa (ARANESP, ALBUMIN FREE, IJ) EVERY THURSDAY 07/15/10  Yes [provider]  digoxin (LANOXIN) 0.125 MG tablet Take 1 tablet (0.125 mg total) by mouth every other day. 07/23/17  Yes Lendon Colonel, NP  docusate sodium (COLACE) 100 MG capsule Take 300 mg by mouth at bedtime.   Yes [provider]  fluticasone (FLONASE) 50 MCG/ACT nasal spray USE 2 SPRAYS IN EACH       NOSTRIL ONCE DAILY Patient taking differently: USE 2 SPRAYS IN EACH       NOSTRIL ONCE DAILY AS NEEDED FOR CONGESTION 10/25/16  Yes Fayrene Helper, MD  folic acid (FOLVITE) 1 MG tablet Take 1 tablet (1 mg total) by mouth daily. 05/01/17  Yes Fayrene Helper, MD  furosemide (LASIX) 40 MG tablet Take 1 tablet by mouth  daily 07/23/17  Yes Lendon Colonel, NP  gabapentin (NEURONTIN) 300 MG capsule Take 1  capsule (300 mg total) by mouth at bedtime. 04/16/17  Yes Fayrene Helper, MD  hydrALAZINE (APRESOLINE) 25 MG tablet Take 1 tablet (25 mg total) by mouth 3 (three) times daily. 07/23/17  Yes Lendon Colonel, NP  HYDROcodone-acetaminophen (NORCO) 7.5-325 MG tablet Take 1 tablet by mouth every 6 (six) hours as needed for moderate pain (Must last 30 days.). 10/24/17  Yes Sanjuana Kava, MD  isosorbide mononitrate (  IMDUR) 60 MG 24 hr tablet Take 1 tablet (60 mg total) by mouth daily. 11/06/17  Yes Croitoru, Mihai, MD  losartan (COZAAR) 50 MG tablet take 1 tablet by mouth once daily 06/20/17  Yes Fayrene Helper, MD  meclizine (ANTIVERT) 25 MG tablet Take 1 tablet (25 mg total) by mouth 3 (three) times daily as needed for dizziness. 05/01/17  Yes Fayrene Helper, MD  metolazone (ZAROXOLYN) 2.5 MG tablet TAKE 1 TABLET ONCE A WEEK 07/23/17  Yes Lendon Colonel, NP  ondansetron (ZOFRAN) 4 MG tablet Take 1 tablet (4 mg total) by mouth daily as needed for nausea or vomiting. 05/01/17  Yes Fayrene Helper, MD  Polyethyl [name] (SYSTANE OP) Place 1 drop into both eyes daily as needed (dry eyes).   Yes [provider]  potassium chloride SA (KLOR-CON M20) 20 MEQ tablet Take 1 tablet by mouth  daily 07/23/17  Yes Lendon Colonel, NP  pravastatin (PRAVACHOL) 40 MG tablet Take 1 tablet (40 mg total) by mouth every evening. 07/23/17 11/01/18 Yes Lendon Colonel, NP  SYNTHROID 75 MCG tablet Take 1 tablet by mouth  daily 10/18/17  Yes Fayrene Helper, MD  TOPROL XL 100 MG 24 hr tablet Take 1 tablet (100 mg total) by mouth daily. Take with or immediately following a meal. 10/18/17  Yes Fayrene Helper, MD  oseltamivir (TAMIFLU) 30 MG capsule Take 1 capsule (30 mg total) by mouth 2 (two) times daily. Patient not taking: Reported on 11/15/2017 10/31/17   Sherwood Gambler, MD    Review of Systems:  Constitutional:  No weight loss, night sweats, Fevers, chills, fatigue.    Head&Eyes: No headache.  No vision loss.  No eye pain or scotoma ENT:  No Difficulty swallowing,Tooth/dental problems,Sore throat,  No ear ache, post nasal drip,  Cardio-vascular:  No chest pain, Orthopnea, PND, swelling in lower extremities,  dizziness, palpitations  GI:  No  abdominal pain, nausea, vomiting, diarrhea, loss of appetite, hematochezia, melena, heartburn, indigestion, Resp:  No shortness of breath with exertion or at rest. No cough. No coughing up of blood .No wheezing.No chest wall deformity  Skin:  no rash or lesions.  GU:  no dysuria, change in color of urine, no urgency or frequency. No flank pain.  Musculoskeletal:  No joint pain or swelling. No decreased range of motion. No back pain.  Psych:  No change in mood or affect. No depression or anxiety. Neurologic: No headache, no dysesthesia, no focal weakness, no vision loss. No syncope  Physical Exam: Vitals:   11/15/17 1430 11/15/17 1500 11/15/17 1530 11/15/17 1600  BP: (!) 147/66 (!) 126/52 123/60 (!) 124/54  Pulse: 72 74 75 72  Resp: 18 18 18  (!) 22  Temp:      TempSrc:      SpO2: 99% 100% 97% 93%  Weight:      Height:       General:  A&O x 3, NAD, nontoxic, pleasant/cooperative Head/Eye: No conjunctival hemorrhage, no icterus, [place]/AT, No nystagmus ENT:  No icterus,  No thrush, good dentition, no pharyngeal exudate Neck:  No masses, no lymphadenpathy, no bruits CV:  RRR, no rub, no gallop, no S3 Lung:  CTAB, good air movement, no wheeze, no rhonchi Abdomen: soft/NT, +BS, nondistended, no peritoneal signs Ext: No cyanosis, No rashes, No petechiae, No lymphangitis, No edema Neuro: CNII-XII intact, strength 4/5 in bilateral upper and lower extremities, no dysmetria  Labs on Admission:  Basic Metabolic Panel: Recent Labs  Lab 11/15/17 0951  NA 136  K 5.2*  CL 100*  CO2 26  GLUCOSE 111*  BUN 67*  CREATININE 2.34*  CALCIUM 9.5   Liver Function Tests: No results for input(s): AST, ALT,  ALKPHOS, BILITOT, PROT, ALBUMIN in the last 168 hours. No results for input(s): LIPASE, AMYLASE in the last 168 hours. No results for input(s): AMMONIA in the last 168 hours. CBC: Recent Labs  Lab 11/15/17 0951  WBC 6.8  HGB 9.3*  HCT 30.3*  MCV 96.8  PLT 205   Coagulation Profile: Recent Labs  Lab 11/15/17 0951  INR 2.58   Cardiac Enzymes: No results for input(s): CKTOTAL, CKMB, CKMBINDEX, TROPONINI in the last 168 hours. BNP: Invalid input(s): POCBNP CBG: No results for input(s): GLUCAP in the last 168 hours. Urine analysis:    Component Value Date/Time   COLORURINE STRAW (A) 11/15/2017 1147   APPEARANCEUR CLEAR 11/15/2017 1147   LABSPEC 1.006 11/15/2017 1147   PHURINE 5.0 11/15/2017 1147   GLUCOSEU NEGATIVE 11/15/2017 1147   GLUCOSEU NEG mg/dL 03/22/2010 2313   HGBUR NEGATIVE 11/15/2017 1147   HGBUR negative 05/27/2010 1122   BILIRUBINUR NEGATIVE 11/15/2017 1147   BILIRUBINUR neg 03/11/2013 1443   KETONESUR NEGATIVE 11/15/2017 1147   PROTEINUR NEGATIVE 11/15/2017 1147   UROBILINOGEN 0.2 03/11/2013 1443   UROBILINOGEN 0.2 03/27/2012 1201   NITRITE NEGATIVE 11/15/2017 1147   LEUKOCYTESUR NEGATIVE 11/15/2017 1147   Sepsis Labs: @LABRCNTIP (procalcitonin:4,lacticidven:4) )No results found for this or any previous visit (from the past 240 hour(s)).   Radiological Exams on Admission: Dg Chest 2 View  Result Date: 11/15/2017 CLINICAL DATA:  Productive cough and shortness of breath for 2 weeks. EXAM: CHEST  2 VIEW COMPARISON:  PA and lateral chest 11/01/2017, 10/31/2017, 10/09/2016 and 03/20/2016. CT chest 11/16/2016. FINDINGS: The chest is hyperexpanded. There is cardiomegaly and pulmonary vascular congestion which appear unchanged. No consolidative process, pneumothorax or effusion. AICD is in place. The patient is status post aortic and mitral valve replacement. No acute bony abnormality. IMPRESSION: No acute disease. Cardiomegaly and chronic pulmonary vascular  congestion. COPD. Atherosclerosis. Electronically Signed   By: Inge Rise M.D.   On: 11/15/2017 10:14    EKG:  pending    Time spent:60 minutes Code Status:   FULL Family Communication:  Sister updated at bedside Disposition Plan: expect 2-3 day hospitalization Consults called: none DVT Prophylaxis: coumadin  Orson Eva, DO  [facility] Pager [phone]  If 7PM-7AM, please contact night-coverage [email or web address] Password [facility] 11/15/2017, 4:31 PM

## 2017-11-15 NOTE — ED Notes (Signed)
Patient's o2 sat 96-98% while walking.  Patient stated she normally walked with a cane but was able to walk with assistance.

## 2017-11-16 ENCOUNTER — Inpatient Hospital Stay (HOSPITAL_COMMUNITY): Payer: Medicare Other

## 2017-11-16 DIAGNOSIS — E785 Hyperlipidemia, unspecified: Secondary | ICD-10-CM

## 2017-11-16 DIAGNOSIS — I5023 Acute on chronic systolic (congestive) heart failure: Secondary | ICD-10-CM

## 2017-11-16 DIAGNOSIS — Z7901 Long term (current) use of anticoagulants: Secondary | ICD-10-CM

## 2017-11-16 DIAGNOSIS — N184 Chronic kidney disease, stage 4 (severe): Secondary | ICD-10-CM

## 2017-11-16 DIAGNOSIS — Z952 Presence of prosthetic heart valve: Secondary | ICD-10-CM

## 2017-11-16 DIAGNOSIS — J410 Simple chronic bronchitis: Secondary | ICD-10-CM

## 2017-11-16 LAB — RESPIRATORY PANEL BY PCR
Adenovirus: NOT DETECTED
Bordetella pertussis: NOT DETECTED
Chlamydophila pneumoniae: NOT DETECTED
Coronavirus 229E: NOT DETECTED
Coronavirus HKU1: NOT DETECTED
Coronavirus NL63: NOT DETECTED
Coronavirus OC43: NOT DETECTED
Influenza A H1 2009: NOT DETECTED
Influenza A H1: NOT DETECTED
Influenza A H3: NOT DETECTED
Influenza A: NOT DETECTED
Influenza B: NOT DETECTED
Metapneumovirus: NOT DETECTED
Mycoplasma pneumoniae: NOT DETECTED
Parainfluenza Virus 1: NOT DETECTED
Parainfluenza Virus 2: NOT DETECTED
Parainfluenza Virus 3: NOT DETECTED
Parainfluenza Virus 4: NOT DETECTED
Respiratory Syncytial Virus: NOT DETECTED
Rhinovirus / Enterovirus: NOT DETECTED

## 2017-11-16 LAB — PROTIME-INR
INR: 2.94
Prothrombin Time: 30.5 seconds — ABNORMAL HIGH (ref 11.4–15.2)

## 2017-11-16 LAB — ECHOCARDIOGRAM COMPLETE
Height: 67 in
Weight: 3396.85 oz

## 2017-11-16 LAB — BASIC METABOLIC PANEL
Anion gap: 11 (ref 5–15)
BUN: 68 mg/dL — ABNORMAL HIGH (ref 6–20)
CO2: 27 mmol/L (ref 22–32)
Calcium: 9.5 mg/dL (ref 8.9–10.3)
Chloride: 98 mmol/L — ABNORMAL LOW (ref 101–111)
Creatinine, Ser: 2.44 mg/dL — ABNORMAL HIGH (ref 0.44–1.00)
GFR calc Af Amer: 20 mL/min — ABNORMAL LOW (ref >60)
GFR calc non Af Amer: 17 mL/min — ABNORMAL LOW (ref >60)
Glucose, Bld: 99 mg/dL (ref 65–99)
Potassium: 4.1 mmol/L (ref 3.5–5.1)
Sodium: 136 mmol/L (ref 135–145)

## 2017-11-16 LAB — DIGOXIN LEVEL: Digoxin Level: 0.9 ng/mL (ref 0.8–2.0)

## 2017-11-16 MED ORDER — WARFARIN SODIUM 2.5 MG PO TABS
2.5000 mg | ORAL_TABLET | Freq: Once | ORAL | Status: AC
  Administered 2017-11-16: 2.5 mg via ORAL
  Filled 2017-11-16: qty 1

## 2017-11-16 MED ORDER — POLYVINYL ALCOHOL 1.4 % OP SOLN: 1.0000 [drp] | OPHTHALMIC | Status: DC | PRN

## 2017-11-16 MED ORDER — CALCIUM CARBONATE 1250 (500 CA) MG PO TABS
1.0000 | ORAL_TABLET | Freq: Every day | ORAL | Status: DC
  Administered 2017-11-16 – 2017-11-17 (×2): 500 mg via ORAL
  Filled 2017-11-16 (×2): qty 1

## 2017-11-16 NOTE — Progress Notes (Signed)
[place] for Warfarin (home med) Indication: mechanical valve  Allergies  Allergen Reactions  . Penicillins Other (See Comments)    Bruise Has patient had a PCN reaction causing immediate rash, facial/tongue/throat swelling, SOB or lightheadedness with hypotension: no Has patient had a PCN reaction causing severe rash involving mucus membranes or skin necrosis: no Has patient had a PCN reaction that required hospitalization: no Has patient had a PCN reaction occurring within the last 10 years: no If all of the above answers are "NO", then may proceed with Cephalosporin use.   . Aspirin Other (See Comments)    On coumadin  . Fentanyl Dermatitis    Rash with patch   . Niacin Itching   Patient Measurements: Height: 5\' 7"  (170.2 cm) Weight: 212 lb 4.9 oz (96.3 kg) IBW/kg (Calculated) : 61.6  Vital Signs: Temp: 98.4 F (36.9 C) (02/22 0422) Temp Source: Oral (02/22 0422) BP: 100/41 (02/22 0422) Pulse Rate: 70 (02/22 0422)  Labs: Recent Labs    11/15/17 0951 11/16/17 0553  HGB 9.3*  --   HCT 30.3*  --   PLT 205  --   LABPROT 27.5* 30.5*  INR 2.58 2.94  CREATININE 2.34* 2.44*   Estimated Creatinine Clearance: 20.8 mL/min (A) (by C-G formula based on SCr of 2.44 mg/dL (H)).  Medical History: Past Medical History:  Diagnosis Date  . Adenomatous polyp of colon 12/16/2002   Dr. [name]/[facility]  . Allergy   . Cataract   . CHB (complete heart block) ([place]) 10/07/2014      . CHF (congestive heart failure) ([place])       . Chronic kidney disease, stage I 2006  . Constipation       . COPD (chronic obstructive pulmonary disease) ([place])       . DJD (degenerative joint disease) of lumbar spine   . DM (diabetes mellitus) ([place]) 2006  . Esophageal reflux   . Glaucoma   . Gout       . Heart murmur   . Hyperlipemia   . IDA (iron deficiency anemia)    Parenteral iron/[name]  . Insomnia       .  Non-ischemic cardiomyopathy ([place])    a. s/p MDT CRTD  . Obesity, unspecified   . Other and unspecified hyperlipidemia   . Oxygen deficiency 2014   nocturnal;  . Spondylolisthesis   . Spondylosis   . Thyroid cancer ([place])    remote thyroidectomy, no recurrence, pt denies in 2016 thyroid cancer  . Unspecified hypothyroidism       . 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 Vertigo        Medications:  Medications Prior to Admission  Medication Sig Dispense Refill Last Dose  . acetaminophen (TYLENOL) 500 MG tablet Take 500 mg by mouth every 6 (six) hours as needed (pain).   Past Week at Unknown time  . albuterol (PROVENTIL) (2.5 MG/3ML) 0.083% nebulizer solution INHALE 1 VIAL VIA NEBULIZER THREE TIMES DAILY. (Patient taking differently: INHALE 1 VIAL VIA NEBULIZER THREE TIMES DAILY AS NEEDED) 90 vial 5 11/15/2017 at Unknown time  . albuterol (VENTOLIN HFA) 108 (90 Base) MCG/ACT inhaler Inhale 2 puffs into the lungs every 6 (six) hours as needed for wheezing or shortness of breath. 3 Inhaler 3 11/15/2017 at Unknown time  . allopurinol (ZYLOPRIM) 100 MG tablet Take 1 tablet (100 mg total) by mouth daily. 90 tablet 1 11/15/2017 at Unknown  time  . azelastine (ASTELIN) 0.1 % nasal spray Place 2 sprays into both nostrils 2 (two) times daily. Use in each nostril as directed (Patient taking differently: Place 2 sprays into both nostrils daily as needed for allergies. Use in each nostril as directed) 30 mL 12 unknown  . BREO ELLIPTA 100-25 MCG/INH AEPB Inhale 1 puff into the lungs daily.   1 11/15/2017 at Unknown time  . calcium carbonate (OS-CAL) 1250 (500 Ca) MG chewable tablet Chew 1 tablet by mouth daily.   11/14/2017 at Unknown time  . cholecalciferol (VITAMIN D) 1000 UNITS tablet Take 1,000 Units by mouth daily.   11/14/2017 at Unknown time  . COUMADIN 5 MG tablet Take 2 tablets daily except 1 tablet on Tuesdays (Patient taking differently: Take 1 tablet Tuesday, Thursday, and  Saturday and  Take 2 tablets all other days.) 60 tablet 6 11/14/2017 at 2030  . Darbepoetin Alfa (ARANESP, ALBUMIN FREE, IJ) EVERY THURSDAY   Past Week at Unknown time  . digoxin (LANOXIN) 0.125 MG tablet Take 1 tablet (0.125 mg total) by mouth every other day. 45 tablet 3 11/14/2017 at Unknown time  . docusate sodium (COLACE) 100 MG capsule Take 300 mg by mouth at bedtime.   11/14/2017 at Unknown time  . fluticasone (FLONASE) 50 MCG/ACT nasal spray USE 2 SPRAYS IN EACH       NOSTRIL ONCE DAILY (Patient taking differently: USE 2 SPRAYS IN EACH       NOSTRIL ONCE DAILY AS NEEDED FOR CONGESTION) 48 g 1 unknown  . folic acid (FOLVITE) 1 MG tablet Take 1 tablet (1 mg total) by mouth daily. 100 tablet 3 11/14/2017 at Unknown time  . furosemide (LASIX) 40 MG tablet Take 1 tablet by mouth  daily 90 tablet 1 11/15/2017 at Unknown time  . gabapentin (NEURONTIN) 300 MG capsule Take 1 capsule (300 mg total) by mouth at bedtime. 30 capsule 3 Past Week at Unknown time  . hydrALAZINE (APRESOLINE) 25 MG tablet Take 1 tablet (25 mg total) by mouth 3 (three) times daily. 270 tablet 3 11/14/2017 at Unknown time  . HYDROcodone-acetaminophen (NORCO) 7.5-325 MG tablet Take 1 tablet by mouth every 6 (six) hours as needed for moderate pain (Must last 30 days.). 70 tablet 0 11/14/2017 at Unknown time  . isosorbide mononitrate (IMDUR) 60 MG 24 hr tablet Take 1 tablet (60 mg total) by mouth daily. 90 tablet 1 11/14/2017 at Unknown time  . losartan (COZAAR) 50 MG tablet take 1 tablet by mouth once daily 90 tablet 1 11/14/2017 at Unknown time  . meclizine (ANTIVERT) 25 MG tablet Take 1 tablet (25 mg total) by mouth 3 (three) times daily as needed for dizziness. 30 tablet 0 unknown  . metolazone (ZAROXOLYN) 2.5 MG tablet TAKE 1 TABLET ONCE A WEEK 12 tablet 2 11/14/2017 at Unknown time  . ondansetron (ZOFRAN) 4 MG tablet Take 1 tablet (4 mg total) by mouth daily as needed for nausea or vomiting. 90 tablet 0 Past Week at Unknown time  .  Polyethyl [name] (SYSTANE OP) Place 1 drop into both eyes daily as needed (dry eyes).   11/14/2017 at Unknown time  . potassium chloride SA (KLOR-CON M20) 20 MEQ tablet Take 1 tablet by mouth  daily 90 tablet 1 11/14/2017 at Unknown time  . pravastatin (PRAVACHOL) 40 MG tablet Take 1 tablet (40 mg total) by mouth every evening. 90 tablet 3 11/14/2017 at Unknown time  . SYNTHROID 75 MCG tablet Take 1 tablet by mouth  daily 90 tablet 1 11/15/2017 at Unknown time  . TOPROL XL 100 MG 24 hr tablet Take 1 tablet (100 mg total) by mouth daily. Take with or immediately following a meal. 90 tablet 1 11/14/2017 at 1800  . oseltamivir (TAMIFLU) 30 MG capsule Take 1 capsule (30 mg total) by mouth 2 (two) times daily. (Patient not taking: Reported on 11/15/2017) 9 capsule 0 Not Taking at Unknown time    Assessment: 84yo female on chronic Coumadin PTA.  INR is therapeutic on admission.  Home dose listed on PTA med list (above).  INR is rising quickly most likely due to acute illness.    Goal of Therapy:  INR 2-3 (try to keep 2-2.5 per [facility] clinic) Monitor platelets by anticoagulation protocol: Yes   Plan:  Coumadin 2.5mg  today x 1 (to discourage overshoot) INR daily  Hart Robinsons A 11/16/2017,12:06 PM

## 2017-11-16 NOTE — Evaluation (Signed)
Physical Therapy Evaluation Patient Details Name: [name] [name] MRN: [id number] DOB: [birth year] Today's Date: 11/16/2017   History of Present Illness   [name] [name] is a 84 y.o. female with medical history significant of CHF, COPD, chronic kidney disease comes in with 2 days of flulike symptoms.  Patient came to the ED yesterday was diagnosed with influenza sent home on Tamiflu.  She went home progressively got worse with more shortness of breath and felt like she was wheezing.  She came to the ED was found to be hypoxic.  She states her fevers have been high..  She has been coughing a lot.  She reports she is been feeling very very weak and awful.  Patient was ambulated in the ED and her O2 sats dropped to 80% on room air.  She does not have a supplemental oxygen requirement.  She denies any lower extremity edema.  She denies any chest pain.  Patient referred for admission for hypoxia due to her influenza.    Clinical Impression  Patient mostly limited due to c/o fatigue and has to occasionally lean on side rail in hallway for support, no loss of balance and tolerated sitting up at bedside with family member present after therapy.  Patient will benefit from continued physical therapy in hospital and recommended venue below to increase strength, balance, endurance for safe ADLs and gait.    Follow Up Recommendations Home health PT;Supervision - Intermittent    Equipment Recommendations  None recommended by PT    Recommendations for Other Services       Precautions / Restrictions Precautions Precautions: Fall Restrictions Weight Bearing Restrictions: No      Mobility  Bed Mobility Overal bed mobility: Modified Independent                Transfers Overall transfer level: Needs assistance Equipment used: Straight cane Transfers: Sit to/from Stand;Stand Pivot Transfers Sit to Stand: Supervision Stand pivot transfers: Supervision           Ambulation/Gait Ambulation/Gait assistance: Min guard Ambulation Distance (Feet): 25 Feet Assistive device: Straight cane Gait Pattern/deviations: Step-to pattern;Decreased step length - left;Decreased stance time - right;Decreased stride length   Gait velocity interpretation: Below normal speed for age/gender General Gait Details: demonstrates slow slightly labored cadence with occasional leaning on siderail for support due to fatigue, on room air with O2 sats dropping from 97% to 92%  Stairs            Wheelchair Mobility    Modified Rankin (Stroke Patients Only)       Balance Overall balance assessment: Needs assistance Sitting-balance support: Feet supported;No upper extremity supported Sitting balance-Leahy Scale: Good     Standing balance support: Single extremity supported;During functional activity Standing balance-Leahy Scale: Fair                               Pertinent Vitals/Pain Pain Assessment: 0-10 Pain Score: 8  Pain Location: chronic right knee pain Pain Descriptors / Indicators: Aching Pain Intervention(s): Limited activity within patient's tolerance;Monitored during session    [place] expects to be discharged to:: Private residence Living Arrangements: Other relatives(nephew) Available Help at Discharge: Family Type of Home: House Home Access: Level entry     Home Layout: One level Home Equipment: Cane - single point;Walker - 2 wheels;Shower seat;Bedside commode;Toilet riser      Prior Function Level of Independence: Independent with assistive device(s)  Hand Dominance   Dominant Hand: Right    Extremity/Trunk Assessment   Upper Extremity Assessment Upper Extremity Assessment: Generalized weakness    Lower Extremity Assessment Lower Extremity Assessment: Generalized weakness    Cervical / Trunk Assessment Cervical / Trunk Assessment: Normal  Communication   Communication:  No difficulties  Cognition Arousal/Alertness: Awake/alert Behavior During Therapy: WFL for tasks assessed/performed Overall Cognitive Status: Within Functional Limits for tasks assessed                                        General Comments      Exercises     Assessment/Plan    PT Assessment Patient needs continued PT services  PT Problem List Decreased strength;Decreased activity tolerance;Decreased balance;Decreased mobility       PT Treatment Interventions Gait training;Functional mobility training;Therapeutic activities;Therapeutic exercise;Patient/family education    PT Goals (Current goals can be found in the Care Plan section)  Acute Rehab PT Goals PT Goal Formulation: With patient/family Time For Goal Achievement: 11/20/17 Potential to Achieve Goals: Good    Frequency Min 3X/week   Barriers to discharge        Co-evaluation               AM-PAC PT "6 Clicks" Daily Activity  Outcome Measure Difficulty turning over in bed (including adjusting bedclothes, sheets and blankets)?: None Difficulty moving from lying on back to sitting on the side of the bed? : None Difficulty sitting down on and standing up from a chair with arms (e.g., wheelchair, bedside commode, etc,.)?: None Help needed moving to and from a bed to chair (including a wheelchair)?: A Little Help needed walking in hospital room?: A Little Help needed climbing 3-5 steps with a railing? : A Little 6 Click Score: 21    End of Session   Activity Tolerance: Patient tolerated treatment well;Patient limited by fatigue Patient left: in bed;with family/visitor present;with call bell/phone within reach(seated at bedside) Nurse Communication: Mobility status PT Visit Diagnosis: Unsteadiness on feet (R26.81);Other abnormalities of gait and mobility (R26.89);Muscle weakness (generalized) (M62.81)    Time: 1700-1749 PT Time Calculation (min) (ACUTE ONLY): 24 min   Charges:   PT  Evaluation $PT Eval Moderate Complexity: 1 Mod PT Treatments $Therapeutic Activity: 23-37 mins   PT G Codes:        12:05 PM, 11/17/17 Lonell Grandchild, MPT Physical Therapist with [facility] [phone] office [phone] mobile phone

## 2017-11-16 NOTE — Care Management Note (Signed)
Case Management Note  Patient Details  Name: [name] [name] MRN: [id number] Date of Birth: [birth year]  Subjective/Objective:        Pt admitted with CHF. Pt is from home, lives with nephew. She is ind pta. She has cane that she uses with ambulation. Pt has used [facility] in the past, would like to use them again. PT has recommended HH PT. Pt agreeable.              Action/Plan: Pt plans to return home with [facility] services through [facility]. Pt is aware HH has 48 hrs to make first visit. Pt has no DME needs. Vaughan Basta, [facility] rep, is aware of referral and will pull pt info from chart. DC anticipated for tomorrow.   Expected Discharge Date:      11/17/2017            Expected Discharge Plan:  [place]  In-House Referral:  NA  Discharge planning Services  CM Consult  Post Acute Care Choice:  Home Health Choice offered to:  Patient HH Arranged:  PT [place]:  [place]  Status of Service:  Completed, signed off  Sherald Barge, RN 11/16/2017, 1:21 PM

## 2017-11-16 NOTE — Progress Notes (Signed)
PROGRESS NOTE    [name] [name]  IRJ:[id number] DOB: 04-02-1932 DOA: 11/15/2017 PCP: Fayrene Helper, MD    Brief Narrative:  84 year old female with a history of chronic kidney disease stage IV, diastolic CHF, COPD, presents to the hospital with complaints of progressive shortness of breath and PND type symptoms.  Found to have mild volume overload as well as an element of COPD exacerbation.  Treated with intravenous Lasix and oral steroids.  Clinically she is improving.  Possible discharge in the next 24 hours if she continues to improve   Assessment & Plan:   Active Problems:   Hyperlipidemia LDL goal <70   Warfarin-induced coagulopathy (HCC)   S/P AVR (aortic valve replacement)   CKD (chronic kidney disease), stage IV (HCC)   Chronic anticoagulation, on coumadin for Mechanical AVR and MVR   COPD (chronic obstructive pulmonary disease) (HCC)   S/P ICD (internal cardiac defibrillator) procedure   Acute on chronic systolic CHF (congestive heart failure) (HCC)   COPD with acute exacerbation ([place])   1. Acute on chronic systolic CHF.  Ejection fraction has significantly improved since prior study.  She is been treated with IV Lasix.  She reports good urine output, but intake and output has not been recorded.  She feels that her lower extremity edema is improving.  Continue current treatments. 2. Possible COPD exacerbation.  Currently on nebulizer treatments and steroids.  Continue on inhaled steroids.  Viral respiratory panel has been ordered. 3. Mechanical aortic valve and mitral valve.  Continue on Coumadin. 4. Lower extremity edema.  Possibly related to CHF.  Venous Dopplers negative for DVT.  Overall improving. 5. CKD stage IV.  Creatinine is at baseline.  Continue to monitor with diuresis. 6. Hyperlipidemia.  Continue statin 7. Hyperkalemia.  Improved with diuresis.   DVT prophylaxis: Coumadin Code Status: Full code Family Communication: No family present Disposition Plan:  Discharge home once improved.   Consultants:     Procedures:  Echo:- LVEF has improved significantly since prior study from February   2018. Gradients suggest adequate aortic and mitral prosthetic    function.  Antimicrobials:       Subjective: Feeling better since admission.  Shortness of breath improving.  No cough.  Objective: Vitals:   11/16/17 0137 11/16/17 0422 11/16/17 0926 11/16/17 1214  BP:  (!) 100/41  (!) 125/45  Pulse:  70    Resp:  18    Temp:  98.4 F (36.9 C)    TempSrc:  Oral    SpO2: 95% 100% 98% 96%  Weight:  96.3 kg (212 lb 4.9 oz)    Height:        Intake/Output Summary (Last 24 hours) at 11/16/2017 1908 Last data filed at 11/16/2017 1631 Gross per 24 hour  Intake 240 ml  Output -  Net 240 ml   Filed Weights   11/15/17 0909 11/15/17 1858 11/16/17 0422  Weight: 97.5 kg (215 lb) 96.4 kg (212 lb 8 oz) 96.3 kg (212 lb 4.9 oz)    Examination:  General exam: Appears calm and comfortable  Respiratory system: Mild wheeze bilaterally. Respiratory effort normal. Cardiovascular system: S1 & S2 heard, RRR.  Mildly elevated JVD, murmurs, rubs, gallops or clicks. No pedal edema. Gastrointestinal system: Abdomen is nondistended, soft and nontender. No organomegaly or masses felt. Normal bowel sounds heard. Central nervous system: Alert and oriented. No focal neurological deficits. Extremities: Symmetric 5 x 5 power. Skin: No rashes, lesions or ulcers Psychiatry: Judgement and insight appear normal. Mood &  affect appropriate.     Data Reviewed: I have personally reviewed following labs and imaging studies  CBC: Recent Labs  Lab 11/15/17 0951  WBC 6.8  HGB 9.3*  HCT 30.3*  MCV 96.8  PLT 166   Basic Metabolic Panel: Recent Labs  Lab 11/15/17 0951 11/16/17 0553  NA 136 136  K 5.2* 4.1  CL 100* 98*  CO2 26 27  GLUCOSE 111* 99  BUN 67* 68*  CREATININE 2.34* 2.44*  CALCIUM 9.5 9.5   GFR: Estimated Creatinine Clearance: 20.8 mL/min (A)  (by C-G formula based on SCr of 2.44 mg/dL (H)). Liver Function Tests: No results for input(s): AST, ALT, ALKPHOS, BILITOT, PROT, ALBUMIN in the last 168 hours. No results for input(s): LIPASE, AMYLASE in the last 168 hours. No results for input(s): AMMONIA in the last 168 hours. Coagulation Profile: Recent Labs  Lab 11/15/17 0951 11/16/17 0553  INR 2.58 2.94   Cardiac Enzymes: No results for input(s): CKTOTAL, CKMB, CKMBINDEX, TROPONINI in the last 168 hours. BNP (last 3 results) No results for input(s): PROBNP in the last 8760 hours. HbA1C: No results for input(s): HGBA1C in the last 72 hours. CBG: No results for input(s): GLUCAP in the last 168 hours. Lipid Profile: No results for input(s): CHOL, HDL, LDLCALC, TRIG, CHOLHDL, LDLDIRECT in the last 72 hours. Thyroid Function Tests: No results for input(s): TSH, T4TOTAL, FREET4, T3FREE, THYROIDAB in the last 72 hours. Anemia Panel: No results for input(s): VITAMINB12, FOLATE, FERRITIN, TIBC, IRON, RETICCTPCT in the last 72 hours. Sepsis Labs: No results for input(s): PROCALCITON, LATICACIDVEN in the last 168 hours.  No results found for this or any previous visit (from the past 240 hour(s)).       Radiology Studies: Dg Chest 2 View  Result Date: 11/15/2017 CLINICAL DATA:  Productive cough and shortness of breath for 2 weeks. EXAM: CHEST  2 VIEW COMPARISON:  PA and lateral chest 11/01/2017, 10/31/2017, 10/09/2016 and 03/20/2016. CT chest 11/16/2016. FINDINGS: The chest is hyperexpanded. There is cardiomegaly and pulmonary vascular congestion which appear unchanged. No consolidative process, pneumothorax or effusion. AICD is in place. The patient is status post aortic and mitral valve replacement. No acute bony abnormality. IMPRESSION: No acute disease. Cardiomegaly and chronic pulmonary vascular congestion. COPD. Atherosclerosis. Electronically Signed   By: Inge Rise M.D.   On: 11/15/2017 10:14   US Venous Img Lower  Bilateral  Result Date: 11/16/2017 CLINICAL DATA:  Bilateral lower extremity leg pain and edema EXAM: BILATERAL LOWER EXTREMITY VENOUS DOPPLER ULTRASOUND TECHNIQUE: Gray-scale sonography with graded compression, as well as color Doppler and duplex ultrasound were performed to evaluate the lower extremity deep venous systems from the level of the common femoral vein and including the common femoral, femoral, profunda femoral, popliteal and calf veins including the posterior tibial, peroneal and gastrocnemius veins when visible. The superficial great saphenous vein was also interrogated. Spectral Doppler was utilized to evaluate flow at rest and with distal augmentation maneuvers in the common femoral, femoral and popliteal veins. COMPARISON:  None. FINDINGS: RIGHT LOWER EXTREMITY Common Femoral Vein: No evidence of thrombus. Normal compressibility, respiratory phasicity and response to augmentation. Saphenofemoral Junction: No evidence of thrombus. Normal compressibility and flow on color Doppler imaging. Profunda Femoral Vein: No evidence of thrombus. Normal compressibility and flow on color Doppler imaging. Femoral Vein: No evidence of thrombus. Normal compressibility, respiratory phasicity and response to augmentation. Popliteal Vein: No evidence of thrombus. Normal compressibility, respiratory phasicity and response to augmentation. Calf Veins: No evidence of thrombus.  Normal compressibility and flow on color Doppler imaging. Superficial Great Saphenous Vein: No evidence of thrombus. Normal compressibility. Venous Reflux:  None. Other Findings: Right popliteal fossa Baker cyst measures 5.5 x 4.8 x 1.3 cm LEFT LOWER EXTREMITY Common Femoral Vein: No evidence of thrombus. Normal compressibility, respiratory phasicity and response to augmentation. Saphenofemoral Junction: No evidence of thrombus. Normal compressibility and flow on color Doppler imaging. Profunda Femoral Vein: No evidence of thrombus. Normal  compressibility and flow on color Doppler imaging. Femoral Vein: No evidence of thrombus. Normal compressibility, respiratory phasicity and response to augmentation. Popliteal Vein: No evidence of thrombus. Normal compressibility, respiratory phasicity and response to augmentation. Calf Veins: No evidence of thrombus. Normal compressibility and flow on color Doppler imaging. Superficial Great Saphenous Vein: No evidence of thrombus. Normal compressibility. Venous Reflux:  None. Other Findings:  None. IMPRESSION: No evidence of deep venous thrombosis. 5.5 cm right Baker's cyst. Electronically Signed   By: Jerilynn Mages.  Shick M.D.   On: 11/16/2017 12:04        Scheduled Meds: . allopurinol  100 mg Oral Daily  . budesonide (PULMICORT) nebulizer solution  0.5 mg Nebulization BID  . calcium carbonate  1 tablet Oral Q breakfast  . cholecalciferol  1,000 Units Oral Daily  . digoxin  0.125 mg Oral QODAY  . docusate sodium  300 mg Oral QHS  . fluticasone  2 spray Each Nare Daily  . folic acid  1 mg Oral Daily  . furosemide  40 mg Intravenous Daily  . gabapentin  300 mg Oral QHS  . hydrALAZINE  25 mg Oral TID  . ipratropium-albuterol  3 mL Nebulization Q6H  . isosorbide mononitrate  60 mg Oral Daily  . levothyroxine  75 mcg Oral QAC breakfast  . metoprolol succinate  100 mg Oral Daily  . pravastatin  40 mg Oral QPM  . predniSONE  50 mg Oral Q breakfast  . sodium chloride flush  3 mL Intravenous Q12H  . Warfarin - Pharmacist Dosing Inpatient   Does not apply Q24H   Continuous Infusions: . sodium chloride       LOS: 1 day    Time spent: 25 minutes    Kathie Dike, MD [facility] Pager [phone]  If 7PM-7AM, please contact night-coverage [email or web address] Password [email or web address] 11/16/2017, 7:08 PM

## 2017-11-16 NOTE — Progress Notes (Signed)
  Echocardiogram 2D Echocardiogram has been performed.  [name] [name] 11/16/2017, 3:46 PM

## 2017-11-16 NOTE — Plan of Care (Signed)
  Acute Rehab PT Goals(only PT should resolve) Pt Will Go Sit To Supine/Side 11/16/2017 1207 - Progressing by Lonell Grandchild, PT Flowsheets Taken 11/16/2017 1207  Pt will go Sit to Supine/Side Independently Patient Will Transfer Sit To/From Stand 11/16/2017 1207 - Progressing by Lonell Grandchild, PT Flowsheets Taken 11/16/2017 1207  Patient will transfer sit to/from stand with modified independence Pt Will Transfer Bed To Chair/Chair To Bed 11/16/2017 1207 - Progressing by Lonell Grandchild, PT Flowsheets Taken 11/16/2017 1207  Pt will Transfer Bed to Chair/Chair to Bed with modified independence Pt Will Ambulate 11/16/2017 1207 - Progressing by Lonell Grandchild, PT Flowsheets Taken 11/16/2017 1207  Pt will Ambulate with supervision;with cane;50 feet   12:08 PM, 11/16/17 Lonell Grandchild, MPT Physical Therapist with [facility] [phone] office [phone] mobile phone

## 2017-11-16 NOTE — Care Management Important Message (Signed)
Important Message  Patient Details  Name: [name] [name] MRN: [id number] Date of Birth: 1931-12-02   Medicare Important Message Given:  Yes    Sherald Barge, RN 11/16/2017, 9:50 AM

## 2017-11-17 LAB — BASIC METABOLIC PANEL
Anion gap: 14 (ref 5–15)
BUN: 79 mg/dL — ABNORMAL HIGH (ref 6–20)
CO2: 26 mmol/L (ref 22–32)
Calcium: 10 mg/dL (ref 8.9–10.3)
Chloride: 98 mmol/L — ABNORMAL LOW (ref 101–111)
Creatinine, Ser: 2.36 mg/dL — ABNORMAL HIGH (ref 0.44–1.00)
GFR calc Af Amer: 21 mL/min — ABNORMAL LOW (ref >60)
GFR calc non Af Amer: 18 mL/min — ABNORMAL LOW (ref >60)
Glucose, Bld: 123 mg/dL — ABNORMAL HIGH (ref 65–99)
Potassium: 4.5 mmol/L (ref 3.5–5.1)
Sodium: 138 mmol/L (ref 135–145)

## 2017-11-17 LAB — PROTIME-INR
INR: 3.19
Prothrombin Time: 32.5 seconds — ABNORMAL HIGH (ref 11.4–15.2)

## 2017-11-17 MED ORDER — GUAIFENESIN ER 600 MG PO TB12: 600.0000 mg | ORAL_TABLET | Freq: Two times a day (BID) | ORAL | 2 refills | Status: AC

## 2017-11-17 MED ORDER — PREDNISONE 10 MG PO TABS: ORAL_TABLET | ORAL | 0 refills | Status: DC

## 2017-11-17 NOTE — Progress Notes (Signed)
Patient states understanding of discharge instructions.  

## 2017-11-17 NOTE — Progress Notes (Signed)
SATURATION QUALIFICATIONS: (This note is used to comply with regulatory documentation for home oxygen)  Patient Saturations on Room Air at Rest = 98%  Patient Saturations on Room Air while Ambulating = 93% Patient Saturations on 0 Liters of oxygen while Ambulating = n/a%  Please briefly explain why patient needs home oxygen: 

## 2017-11-17 NOTE — Discharge Summary (Addendum)
Physician Discharge Summary  [name] [name] PVV:[id number] DOB: [birth year] DOA: 11/15/2017  PCP: Fayrene Helper, MD  Admit date: 11/15/2017 Discharge date: 11/17/2017  Admitted From: Home Disposition: Home  Recommendations for Outpatient Follow-up:  1. Follow up with PCP in 1-2 weeks 2. Please obtain BMP/CBC in one week 3. ARB discontinued due to renal dysfunction  Home Health: Home health PT Equipment/Devices:  Discharge Condition: Stable CODE STATUS: Full code Diet recommendation: Heart Healthy   Brief/Interim Summary: 84 year old female with a history of chronic kidney disease stage IV, chronic systolic CHF, COPD, presents to the hospital with complaints of progressive shortness of breath and PND type symptoms.  Found to have mild volume overload as well as an element of COPD exacerbation.  Treated with intravenous Lasix and oral steroids.  Clinically she is improving.  Discharge Diagnoses:  Active Problems:   Hyperlipidemia LDL goal <70   Warfarin-induced coagulopathy (HCC)   S/P AVR (aortic valve replacement)   CKD (chronic kidney disease), stage IV (HCC)   Chronic anticoagulation, on coumadin for Mechanical AVR and MVR   COPD (chronic obstructive pulmonary disease) (HCC)   S/P ICD (internal cardiac defibrillator) procedure   Acute on chronic systolic CHF (congestive heart failure) (HCC)   COPD with acute exacerbation ([place])  1. Acute on chronic systolic CHF.  Mild. Ejection fraction has significantly improved since prior study.  Patient was treated with intravenous Lasix.  She reports good urine output, but intake and output has not been accurately recorded.    Overall lower extremity edema has improved.  She has been transitioned back to oral diuretics.  I suspect a larger component of her shortness of breath was related to COPD exacerbation. 2. COPD exacerbation.    Treated with steroids, nebulizer treatments.  Respiratory virus panel was found to be negative.  Overall  shortness of breath has improved.  She does have occasional rhonchi.  Continue pulmonary hygiene.  She has been started on a prednisone taper.  Continue nebulizer treatments at home. 3. Mechanical aortic valve and mitral valve.  Continue on Coumadin. 4. Lower extremity edema.  Possibly related to CHF.  Venous Dopplers negative for DVT.  Overall improving with diuresis. 5. CKD stage IV.  Creatinine is at baseline.    ARB discontinued due to renal dysfunction. 6. Hyperlipidemia.  Continue statin 7. Hyperkalemia.  Improved with diuresis.   Discharge Instructions  Discharge Instructions    Diet - low sodium heart healthy   Complete by:  As directed    Increase activity slowly   Complete by:  As directed      Allergies as of 11/17/2017      Reactions   Penicillins Other (See Comments)   Bruise Has patient had a PCN reaction causing immediate rash, facial/tongue/throat swelling, SOB or lightheadedness with hypotension: no Has patient had a PCN reaction causing severe rash involving mucus membranes or skin necrosis: no Has patient had a PCN reaction that required hospitalization: no Has patient had a PCN reaction occurring within the last 10 years: no If all of the above answers are "NO", then may proceed with Cephalosporin use.   Aspirin Other (See Comments)   On coumadin   Fentanyl Dermatitis   Rash with patch   Niacin Itching      Medication List    STOP taking these medications   losartan 50 MG tablet Commonly known as:  COZAAR   oseltamivir 30 MG capsule Commonly known as:  TAMIFLU     TAKE these medications  acetaminophen 500 MG tablet Commonly known as:  TYLENOL Take 500 mg by mouth every 6 (six) hours as needed (pain).   albuterol 108 (90 Base) MCG/ACT inhaler Commonly known as:  VENTOLIN HFA Inhale 2 puffs into the lungs every 6 (six) hours as needed for wheezing or shortness of breath. What changed:  Another medication with the same name was changed. Make sure  you understand how and when to take each.   albuterol (2.5 MG/3ML) 0.083% nebulizer solution Commonly known as:  PROVENTIL INHALE 1 VIAL VIA NEBULIZER THREE TIMES DAILY. What changed:  See the new instructions.   allopurinol 100 MG tablet Commonly known as:  ZYLOPRIM Take 1 tablet (100 mg total) by mouth daily.   ARANESP (ALBUMIN FREE) IJ EVERY THURSDAY   azelastine 0.1 % nasal spray Commonly known as:  ASTELIN Place 2 sprays into both nostrils 2 (two) times daily. Use in each nostril as directed What changed:    when to take this  reasons to take this  additional instructions   BREO ELLIPTA 100-25 MCG/INH Aepb Generic drug:  fluticasone furoate-vilanterol Inhale 1 puff into the lungs daily.   calcium carbonate 1250 (500 Ca) MG chewable tablet Commonly known as:  OS-CAL Chew 1 tablet by mouth daily.   cholecalciferol 1000 units tablet Commonly known as:  VITAMIN D Take 1,000 Units by mouth daily.   COUMADIN 5 MG tablet Generic drug:  warfarin Take as directed. If you are unsure how to take this medication, talk to your nurse or doctor. Original instructions:  Take 2 tablets daily except 1 tablet on Tuesdays What changed:  additional instructions   digoxin 0.125 MG tablet Commonly known as:  LANOXIN Take 1 tablet (0.125 mg total) by mouth every other day.   docusate sodium 100 MG capsule Commonly known as:  COLACE Take 300 mg by mouth at bedtime.   fluticasone 50 MCG/ACT nasal spray Commonly known as:  FLONASE USE 2 SPRAYS IN EACH       NOSTRIL ONCE DAILY What changed:    how much to take  how to take this  when to take this   folic acid 1 MG tablet Commonly known as:  FOLVITE Take 1 tablet (1 mg total) by mouth daily.   furosemide 40 MG tablet Commonly known as:  LASIX Take 1 tablet by mouth  daily   gabapentin 300 MG capsule Commonly known as:  NEURONTIN Take 1 capsule (300 mg total) by mouth at bedtime.   guaiFENesin 600 MG 12 hr  tablet Commonly known as:  MUCINEX Take 1 tablet (600 mg total) by mouth 2 (two) times daily.   hydrALAZINE 25 MG tablet Commonly known as:  APRESOLINE Take 1 tablet (25 mg total) by mouth 3 (three) times daily.   HYDROcodone-acetaminophen 7.5-325 MG tablet Commonly known as:  NORCO Take 1 tablet by mouth every 6 (six) hours as needed for moderate pain (Must last 30 days.).   isosorbide mononitrate 60 MG 24 hr tablet Commonly known as:  IMDUR Take 1 tablet (60 mg total) by mouth daily.   meclizine 25 MG tablet Commonly known as:  ANTIVERT Take 1 tablet (25 mg total) by mouth 3 (three) times daily as needed for dizziness.   metolazone 2.5 MG tablet Commonly known as:  ZAROXOLYN TAKE 1 TABLET ONCE A WEEK   ondansetron 4 MG tablet Commonly known as:  ZOFRAN Take 1 tablet (4 mg total) by mouth daily as needed for nausea or vomiting.   potassium chloride  SA 20 MEQ tablet Commonly known as:  KLOR-CON M20 Take 1 tablet by mouth  daily   pravastatin 40 MG tablet Commonly known as:  PRAVACHOL Take 1 tablet (40 mg total) by mouth every evening.   predniSONE 10 MG tablet Commonly known as:  DELTASONE Take 40mg  po daily for 2 days then 30mg  daily for 2 days then 20mg  daily for 2 days then 10mg  daily for 2 days then stop   SYNTHROID 75 MCG tablet Generic drug:  levothyroxine Take 1 tablet by mouth  daily   SYSTANE OP Place 1 drop into both eyes daily as needed (dry eyes).   TOPROL XL 100 MG 24 hr tablet Generic drug:  metoprolol succinate Take 1 tablet (100 mg total) by mouth daily. Take with or immediately following a meal.      Follow-up Information    [facility].   Specialty:  Home Health Services Contact information: [place] [address] [phone]        Fayrene Helper, MD. Schedule an appointment as soon as possible for a visit in 1 week(s).   Specialty:  Family Medicine Contact information: [address], [place] [address] [phone]        Sanda Klein, MD .   Specialty:  Cardiology Contact information: [address] [place] [address] [place] [address] [phone]          Allergies  Allergen Reactions  . Penicillins Other (See Comments)    Bruise Has patient had a PCN reaction causing immediate rash, facial/tongue/throat swelling, SOB or lightheadedness with hypotension: no Has patient had a PCN reaction causing severe rash involving mucus membranes or skin necrosis: no Has patient had a PCN reaction that required hospitalization: no Has patient had a PCN reaction occurring within the last 10 years: no If all of the above answers are "NO", then may proceed with Cephalosporin use.   . Aspirin Other (See Comments)    On coumadin  . Fentanyl Dermatitis    Rash with patch   . Niacin Itching    Consultations:     Procedures/Studies: Dg Chest 2 View  Result Date: 11/15/2017 CLINICAL DATA:  Productive cough and shortness of breath for 2 weeks. EXAM: CHEST  2 VIEW COMPARISON:  PA and lateral chest 11/01/2017, 10/31/2017, 10/09/2016 and 03/20/2016. CT chest 11/16/2016. FINDINGS: The chest is hyperexpanded. There is cardiomegaly and pulmonary vascular congestion which appear unchanged. No consolidative process, pneumothorax or effusion. AICD is in place. The patient is status post aortic and mitral valve replacement. No acute bony abnormality. IMPRESSION: No acute disease. Cardiomegaly and chronic pulmonary vascular congestion. COPD. Atherosclerosis. Electronically Signed   By: Inge Rise M.D.   On: 11/15/2017 10:14   Dg Chest 2 View  Result Date: 11/01/2017 CLINICAL DATA:  Shortness of breath, in the emergency department yesterday for the flu, began wheezing and feeling worse today, history of heart block, CHF, COPD, diabetes mellitus, hypertension, non ischemic cardiomyopathy, former smoker EXAM: CHEST  2 VIEW COMPARISON:  10/31/2017  FINDINGS: LEFT subclavian AICD leads appear stable. Enlargement of cardiac silhouette with minimal pulmonary vascular congestion. Atherosclerotic calcifications aorta. Emphysematous and bronchitic changes consistent with COPD. Interstitial prominence in both lungs slightly greater on RIGHT, increased since previous study question mild pulmonary edema. No segmental consolidation, pleural effusion, or pneumothorax. Bones demineralized. IMPRESSION: Suspected mild pulmonary edema. Electronically Signed   By: Lavonia Dana M.D.   On: 11/01/2017 19:52  Dg Chest 2 View  Result Date: 10/31/2017 CLINICAL DATA:  Cough EXAM: CHEST  2 VIEW COMPARISON:  08/31/2017 FINDINGS: Cardiac enlargement without heart failure. Aortic and mitral valve replacement unchanged in position. Internal cardiac defibrillator leads unchanged from the prior study. Lungs are clear without infiltrate effusion or mass. Atherosclerotic aorta. IMPRESSION: No active cardiopulmonary disease. Electronically Signed   By: Franchot Gallo M.D.   On: 10/31/2017 13:44   US Venous Img Lower Bilateral  Result Date: 11/16/2017 CLINICAL DATA:  Bilateral lower extremity leg pain and edema EXAM: BILATERAL LOWER EXTREMITY VENOUS DOPPLER ULTRASOUND TECHNIQUE: Gray-scale sonography with graded compression, as well as color Doppler and duplex ultrasound were performed to evaluate the lower extremity deep venous systems from the level of the common femoral vein and including the common femoral, femoral, profunda femoral, popliteal and calf veins including the posterior tibial, peroneal and gastrocnemius veins when visible. The superficial great saphenous vein was also interrogated. Spectral Doppler was utilized to evaluate flow at rest and with distal augmentation maneuvers in the common femoral, femoral and popliteal veins. COMPARISON:  None. FINDINGS: RIGHT LOWER EXTREMITY Common Femoral Vein: No evidence of thrombus. Normal compressibility, respiratory phasicity and  response to augmentation. Saphenofemoral Junction: No evidence of thrombus. Normal compressibility and flow on color Doppler imaging. Profunda Femoral Vein: No evidence of thrombus. Normal compressibility and flow on color Doppler imaging. Femoral Vein: No evidence of thrombus. Normal compressibility, respiratory phasicity and response to augmentation. Popliteal Vein: No evidence of thrombus. Normal compressibility, respiratory phasicity and response to augmentation. Calf Veins: No evidence of thrombus. Normal compressibility and flow on color Doppler imaging. Superficial Great Saphenous Vein: No evidence of thrombus. Normal compressibility. Venous Reflux:  None. Other Findings: Right popliteal fossa Baker cyst measures 5.5 x 4.8 x 1.3 cm LEFT LOWER EXTREMITY Common Femoral Vein: No evidence of thrombus. Normal compressibility, respiratory phasicity and response to augmentation. Saphenofemoral Junction: No evidence of thrombus. Normal compressibility and flow on color Doppler imaging. Profunda Femoral Vein: No evidence of thrombus. Normal compressibility and flow on color Doppler imaging. Femoral Vein: No evidence of thrombus. Normal compressibility, respiratory phasicity and response to augmentation. Popliteal Vein: No evidence of thrombus. Normal compressibility, respiratory phasicity and response to augmentation. Calf Veins: No evidence of thrombus. Normal compressibility and flow on color Doppler imaging. Superficial Great Saphenous Vein: No evidence of thrombus. Normal compressibility. Venous Reflux:  None. Other Findings:  None. IMPRESSION: No evidence of deep venous thrombosis. 5.5 cm right Baker's cyst. Electronically Signed   By: Jerilynn Mages.  Shick M.D.   On: 11/16/2017 12:04    Echo:- LVEF has improved significantly since prior study from February   2018. Gradients suggest adequate aortic and mitral prosthetic   function.   Subjective: Feeling better today.  Still has occasional cough.  Overall shortness  of breath is better.  Able to ambulate without difficulty.  Discharge Exam: Vitals:   11/17/17 0806 11/17/17 1426  BP:    Pulse:    Resp:    Temp:    SpO2: 96% 95%   Vitals:   11/17/17 0157 11/17/17 0602 11/17/17 0806 11/17/17 1426  BP:  (!) 111/38    Pulse:  70    Resp:      Temp:  98.2 F (36.8 C)    TempSrc:  Oral    SpO2: 97% 100% 96% 95%  Weight:  97 kg (213 lb 13.5 oz)    Height:        General: Pt is alert, awake, not in  acute distress Cardiovascular: RRR, S1/S2 +, no rubs, no gallops Respiratory: Occasional rhonchi, no wheezing, mostly clear. Abdominal: Soft, NT, ND, bowel sounds + Extremities: no edema, no cyanosis    The results of significant diagnostics from this hospitalization (including imaging, microbiology, ancillary and laboratory) are listed below for reference.     Microbiology: Recent Results (from the past 240 hour(s))  Respiratory Panel by PCR     Status: None   Collection Time: 11/15/17  6:42 PM  Result Value Ref Range Status   Adenovirus NOT DETECTED NOT DETECTED Final   Coronavirus 229E NOT DETECTED NOT DETECTED Final   Coronavirus HKU1 NOT DETECTED NOT DETECTED Final   Coronavirus NL63 NOT DETECTED NOT DETECTED Final   Coronavirus OC43 NOT DETECTED NOT DETECTED Final   Metapneumovirus NOT DETECTED NOT DETECTED Final   Rhinovirus / Enterovirus NOT DETECTED NOT DETECTED Final   Influenza A NOT DETECTED NOT DETECTED Final   Influenza A H1 NOT DETECTED NOT DETECTED Final   Influenza A H1 2009 NOT DETECTED NOT DETECTED Final   Influenza A H3 NOT DETECTED NOT DETECTED Final   Influenza B NOT DETECTED NOT DETECTED Final   Parainfluenza Virus 1 NOT DETECTED NOT DETECTED Final   Parainfluenza Virus 2 NOT DETECTED NOT DETECTED Final   Parainfluenza Virus 3 NOT DETECTED NOT DETECTED Final   Parainfluenza Virus 4 NOT DETECTED NOT DETECTED Final   Respiratory Syncytial Virus NOT DETECTED NOT DETECTED Final   Bordetella pertussis NOT DETECTED NOT  DETECTED Final   Chlamydophila pneumoniae NOT DETECTED NOT DETECTED Final   Mycoplasma pneumoniae NOT DETECTED NOT DETECTED Final    Comment: Performed at [facility], [address], [place],  [address]     Labs: BNP (last 3 results) Recent Labs    08/31/17 1700 11/01/17 1901 11/15/17 0951  BNP 357.0* 303.0* 580.9*   Basic Metabolic Panel: Recent Labs  Lab 11/15/17 0951 11/16/17 0553 11/17/17 0730  NA 136 136 138  K 5.2* 4.1 4.5  CL 100* 98* 98*  CO2 26 27 26   GLUCOSE 111* 99 123*  BUN 67* 68* 79*  CREATININE 2.34* 2.44* 2.36*  CALCIUM 9.5 9.5 10.0   Liver Function Tests: No results for input(s): AST, ALT, ALKPHOS, BILITOT, PROT, ALBUMIN in the last 168 hours. No results for input(s): LIPASE, AMYLASE in the last 168 hours. No results for input(s): AMMONIA in the last 168 hours. CBC: Recent Labs  Lab 11/15/17 0951  WBC 6.8  HGB 9.3*  HCT 30.3*  MCV 96.8  PLT 205   Cardiac Enzymes: No results for input(s): CKTOTAL, CKMB, CKMBINDEX, TROPONINI in the last 168 hours. BNP: Invalid input(s): POCBNP CBG: No results for input(s): GLUCAP in the last 168 hours. D-Dimer No results for input(s): DDIMER in the last 72 hours. Hgb A1c No results for input(s): HGBA1C in the last 72 hours. Lipid Profile No results for input(s): CHOL, HDL, LDLCALC, TRIG, CHOLHDL, LDLDIRECT in the last 72 hours. Thyroid function studies No results for input(s): TSH, T4TOTAL, T3FREE, THYROIDAB in the last 72 hours.  Invalid input(s): FREET3 Anemia work up No results for input(s): VITAMINB12, FOLATE, FERRITIN, TIBC, IRON, RETICCTPCT in the last 72 hours. Urinalysis    Component Value Date/Time   COLORURINE STRAW (A) 11/15/2017 1147   APPEARANCEUR CLEAR 11/15/2017 1147   LABSPEC 1.006 11/15/2017 1147   PHURINE 5.0 11/15/2017 1147   GLUCOSEU NEGATIVE 11/15/2017 1147   GLUCOSEU NEG mg/dL 03/22/2010 2313   HGBUR NEGATIVE 11/15/2017 1147  HGBUR negative 05/27/2010 1122    BILIRUBINUR NEGATIVE 11/15/2017 1147   BILIRUBINUR neg 03/11/2013 1443   KETONESUR NEGATIVE 11/15/2017 1147   PROTEINUR NEGATIVE 11/15/2017 1147   UROBILINOGEN 0.2 03/11/2013 1443   UROBILINOGEN 0.2 03/27/2012 1201   NITRITE NEGATIVE 11/15/2017 1147   LEUKOCYTESUR NEGATIVE 11/15/2017 1147   Sepsis Labs Invalid input(s): PROCALCITONIN,  WBC,  LACTICIDVEN Microbiology Recent Results (from the past 240 hour(s))  Respiratory Panel by PCR     Status: None   Collection Time: 11/15/17  6:42 PM  Result Value Ref Range Status   Adenovirus NOT DETECTED NOT DETECTED Final   Coronavirus 229E NOT DETECTED NOT DETECTED Final   Coronavirus HKU1 NOT DETECTED NOT DETECTED Final   Coronavirus NL63 NOT DETECTED NOT DETECTED Final   Coronavirus OC43 NOT DETECTED NOT DETECTED Final   Metapneumovirus NOT DETECTED NOT DETECTED Final   Rhinovirus / Enterovirus NOT DETECTED NOT DETECTED Final   Influenza A NOT DETECTED NOT DETECTED Final   Influenza A H1 NOT DETECTED NOT DETECTED Final   Influenza A H1 2009 NOT DETECTED NOT DETECTED Final   Influenza A H3 NOT DETECTED NOT DETECTED Final   Influenza B NOT DETECTED NOT DETECTED Final   Parainfluenza Virus 1 NOT DETECTED NOT DETECTED Final   Parainfluenza Virus 2 NOT DETECTED NOT DETECTED Final   Parainfluenza Virus 3 NOT DETECTED NOT DETECTED Final   Parainfluenza Virus 4 NOT DETECTED NOT DETECTED Final   Respiratory Syncytial Virus NOT DETECTED NOT DETECTED Final   Bordetella pertussis NOT DETECTED NOT DETECTED Final   Chlamydophila pneumoniae NOT DETECTED NOT DETECTED Final   Mycoplasma pneumoniae NOT DETECTED NOT DETECTED Final    Comment: Performed at [facility], [place]. [address], [place], [place] [address]     Time coordinating discharge: Over 30 minutes  SIGNED:   Kathie Dike, MD  [facility] 11/17/2017, 5:55 PM Pager   If 7PM-7AM, please contact night-coverage [email or web address] Password [email or web address]

## 2017-11-17 NOTE — Progress Notes (Signed)
[place] for Warfarin (home med) Indication: mechanical valve  Allergies  Allergen Reactions  . Penicillins Other (See Comments)    Bruise Has patient had a PCN reaction causing immediate rash, facial/tongue/throat swelling, SOB or lightheadedness with hypotension: no Has patient had a PCN reaction causing severe rash involving mucus membranes or skin necrosis: no Has patient had a PCN reaction that required hospitalization: no Has patient had a PCN reaction occurring within the last 10 years: no If all of the above answers are "NO", then may proceed with Cephalosporin use.   . Aspirin Other (See Comments)    On coumadin  . Fentanyl Dermatitis    Rash with patch   . Niacin Itching   Patient Measurements: Height: 5\' 7"  (170.2 cm) Weight: 213 lb 13.5 oz (97 kg) IBW/kg (Calculated) : 61.6  Vital Signs: Temp: 98.2 F (36.8 C) (02/23 0602) Temp Source: Oral (02/23 0602) BP: 111/38 (02/23 0602) Pulse Rate: 70 (02/23 0602)  Labs: Recent Labs    11/15/17 0951 11/16/17 0553 11/17/17 0730  HGB 9.3*  --   --   HCT 30.3*  --   --   PLT 205  --   --   LABPROT 27.5* 30.5* 32.5*  INR 2.58 2.94 3.19  CREATININE 2.34* 2.44* 2.36*   Estimated Creatinine Clearance: 21.6 mL/min (A) (by C-G formula based on SCr of 2.36 mg/dL (H)).  Medical History: Past Medical History:  Diagnosis Date  . Adenomatous polyp of colon 12/16/2002   Dr. [name]/[facility]  . Allergy   . Cataract   . CHB (complete heart block) ([place]) 10/07/2014      . CHF (congestive heart failure) ([place])       . Chronic kidney disease, stage I 2006  . Constipation       . COPD (chronic obstructive pulmonary disease) ([place])       . DJD (degenerative joint disease) of lumbar spine   . DM (diabetes mellitus) ([place]) 2006  . Esophageal reflux   . Glaucoma   . Gout       . Heart murmur   . Hyperlipemia   . IDA (iron deficiency anemia)    Parenteral iron/[name]  . Insomnia       . Non-ischemic cardiomyopathy ([place])    a. s/p MDT CRTD  . Obesity, unspecified   . Other and unspecified hyperlipidemia   . Oxygen deficiency 2014   nocturnal;  . Spondylolisthesis   . Spondylosis   . Thyroid cancer ([place])    remote thyroidectomy, no recurrence, pt denies in 2016 thyroid cancer  . Unspecified hypothyroidism       . 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 Vertigo        Medications:  Medications Prior to Admission  Medication Sig Dispense Refill Last Dose  . acetaminophen (TYLENOL) 500 MG tablet Take 500 mg by mouth every 6 (six) hours as needed (pain).   Past Week at Unknown time  . albuterol (PROVENTIL) (2.5 MG/3ML) 0.083% nebulizer solution INHALE 1 VIAL VIA NEBULIZER THREE TIMES DAILY. (Patient taking differently: INHALE 1 VIAL VIA NEBULIZER THREE TIMES DAILY AS NEEDED) 90 vial 5 11/15/2017 at Unknown time  . albuterol (VENTOLIN HFA) 108 (90 Base) MCG/ACT inhaler Inhale 2 puffs into the lungs every 6 (six) hours as needed for wheezing or shortness of breath. 3 Inhaler 3 11/15/2017 at Unknown time  . allopurinol (ZYLOPRIM) 100 MG tablet Take 1  tablet (100 mg total) by mouth daily. 90 tablet 1 11/15/2017 at Unknown time  . azelastine (ASTELIN) 0.1 % nasal spray Place 2 sprays into both nostrils 2 (two) times daily. Use in each nostril as directed (Patient taking differently: Place 2 sprays into both nostrils daily as needed for allergies. Use in each nostril as directed) 30 mL 12 unknown  . BREO ELLIPTA 100-25 MCG/INH AEPB Inhale 1 puff into the lungs daily.   1 11/15/2017 at Unknown time  . calcium carbonate (OS-CAL) 1250 (500 Ca) MG chewable tablet Chew 1 tablet by mouth daily.   11/14/2017 at Unknown time  . cholecalciferol (VITAMIN D) 1000 UNITS tablet Take 1,000 Units by mouth daily.   11/14/2017 at Unknown time  . COUMADIN 5 MG tablet Take 2 tablets daily except 1 tablet on Tuesdays (Patient taking  differently: Take 1 tablet Tuesday, Thursday, and Saturday and  Take 2 tablets all other days.) 60 tablet 6 11/14/2017 at 2030  . Darbepoetin Alfa (ARANESP, ALBUMIN FREE, IJ) EVERY THURSDAY   Past Week at Unknown time  . digoxin (LANOXIN) 0.125 MG tablet Take 1 tablet (0.125 mg total) by mouth every other day. 45 tablet 3 11/14/2017 at Unknown time  . docusate sodium (COLACE) 100 MG capsule Take 300 mg by mouth at bedtime.   11/14/2017 at Unknown time  . fluticasone (FLONASE) 50 MCG/ACT nasal spray USE 2 SPRAYS IN EACH       NOSTRIL ONCE DAILY (Patient taking differently: USE 2 SPRAYS IN EACH       NOSTRIL ONCE DAILY AS NEEDED FOR CONGESTION) 48 g 1 unknown  . folic acid (FOLVITE) 1 MG tablet Take 1 tablet (1 mg total) by mouth daily. 100 tablet 3 11/14/2017 at Unknown time  . furosemide (LASIX) 40 MG tablet Take 1 tablet by mouth  daily 90 tablet 1 11/15/2017 at Unknown time  . gabapentin (NEURONTIN) 300 MG capsule Take 1 capsule (300 mg total) by mouth at bedtime. 30 capsule 3 Past Week at Unknown time  . hydrALAZINE (APRESOLINE) 25 MG tablet Take 1 tablet (25 mg total) by mouth 3 (three) times daily. 270 tablet 3 11/14/2017 at Unknown time  . HYDROcodone-acetaminophen (NORCO) 7.5-325 MG tablet Take 1 tablet by mouth every 6 (six) hours as needed for moderate pain (Must last 30 days.). 70 tablet 0 11/14/2017 at Unknown time  . isosorbide mononitrate (IMDUR) 60 MG 24 hr tablet Take 1 tablet (60 mg total) by mouth daily. 90 tablet 1 11/14/2017 at Unknown time  . losartan (COZAAR) 50 MG tablet take 1 tablet by mouth once daily 90 tablet 1 11/14/2017 at Unknown time  . meclizine (ANTIVERT) 25 MG tablet Take 1 tablet (25 mg total) by mouth 3 (three) times daily as needed for dizziness. 30 tablet 0 unknown  . metolazone (ZAROXOLYN) 2.5 MG tablet TAKE 1 TABLET ONCE A WEEK 12 tablet 2 11/14/2017 at Unknown time  . ondansetron (ZOFRAN) 4 MG tablet Take 1 tablet (4 mg total) by mouth daily as needed for nausea or  vomiting. 90 tablet 0 Past Week at Unknown time  . Polyethyl [name] (SYSTANE OP) Place 1 drop into both eyes daily as needed (dry eyes).   11/14/2017 at Unknown time  . potassium chloride SA (KLOR-CON M20) 20 MEQ tablet Take 1 tablet by mouth  daily 90 tablet 1 11/14/2017 at Unknown time  . pravastatin (PRAVACHOL) 40 MG tablet Take 1 tablet (40 mg total) by mouth every evening. 90 tablet 3 11/14/2017 at Unknown  time  . SYNTHROID 75 MCG tablet Take 1 tablet by mouth  daily 90 tablet 1 11/15/2017 at Unknown time  . TOPROL XL 100 MG 24 hr tablet Take 1 tablet (100 mg total) by mouth daily. Take with or immediately following a meal. 90 tablet 1 11/14/2017 at 1800  . oseltamivir (TAMIFLU) 30 MG capsule Take 1 capsule (30 mg total) by mouth 2 (two) times daily. (Patient not taking: Reported on 11/15/2017) 9 capsule 0 Not Taking at Unknown time    Assessment: 84yo female on chronic Coumadin PTA.  INR is therapeutic on admission.  Home dose listed on PTA med list (above).  INR is rising quickly most likely due to acute illness.  Concern for SUPRAtherapeutic INR.   Goal of Therapy:  INR 2-3 (try to keep 2-2.5 per [facility] clinic) Monitor platelets by anticoagulation protocol: Yes   Plan:  HOLD coumadin today, allow INR to trend down INR daily  [place] Crane, Chanon Loney A 11/17/2017,9:19 AM

## 2017-11-19 ENCOUNTER — Encounter: Payer: Self-pay | Admitting: Family Medicine

## 2017-11-19 ENCOUNTER — Ambulatory Visit (INDEPENDENT_AMBULATORY_CARE_PROVIDER_SITE_OTHER): Payer: Medicare Other | Admitting: Family Medicine

## 2017-11-19 VITALS — BP 124/74 | HR 79 | Resp 16 | Ht 67.0 in | Wt 216.0 lb

## 2017-11-19 DIAGNOSIS — I10 Essential (primary) hypertension: Secondary | ICD-10-CM

## 2017-11-19 DIAGNOSIS — I255 Ischemic cardiomyopathy: Secondary | ICD-10-CM

## 2017-11-19 DIAGNOSIS — Z09 Encounter for follow-up examination after completed treatment for conditions other than malignant neoplasm: Secondary | ICD-10-CM | POA: Diagnosis not present

## 2017-11-19 MED ORDER — GABAPENTIN 300 MG PO CAPS: 300.0000 mg | ORAL_CAPSULE | Freq: Every day | ORAL | 1 refills | Status: DC

## 2017-11-19 NOTE — Patient Instructions (Signed)
F/U as before  Call if you need me sooner   CBC and BMP next Monday  Please monitor fluid and salt intake

## 2017-11-21 ENCOUNTER — Telehealth: Payer: Self-pay | Admitting: Family Medicine

## 2017-11-21 ENCOUNTER — Encounter: Payer: Self-pay | Admitting: Family Medicine

## 2017-11-21 DIAGNOSIS — N184 Chronic kidney disease, stage 4 (severe): Secondary | ICD-10-CM | POA: Diagnosis not present

## 2017-11-21 DIAGNOSIS — E785 Hyperlipidemia, unspecified: Secondary | ICD-10-CM | POA: Diagnosis not present

## 2017-11-21 DIAGNOSIS — M479 Spondylosis, unspecified: Secondary | ICD-10-CM | POA: Diagnosis not present

## 2017-11-21 DIAGNOSIS — Z79899 Other long term (current) drug therapy: Secondary | ICD-10-CM | POA: Diagnosis not present

## 2017-11-21 DIAGNOSIS — I130 Hypertensive heart and chronic kidney disease with heart failure and stage 1 through stage 4 chronic kidney disease, or unspecified chronic kidney disease: Secondary | ICD-10-CM | POA: Diagnosis not present

## 2017-11-21 DIAGNOSIS — I442 Atrioventricular block, complete: Secondary | ICD-10-CM | POA: Diagnosis not present

## 2017-11-21 DIAGNOSIS — I5023 Acute on chronic systolic (congestive) heart failure: Secondary | ICD-10-CM | POA: Diagnosis not present

## 2017-11-21 DIAGNOSIS — D638 Anemia in other chronic diseases classified elsewhere: Secondary | ICD-10-CM | POA: Diagnosis not present

## 2017-11-21 DIAGNOSIS — T45515D Adverse effect of anticoagulants, subsequent encounter: Secondary | ICD-10-CM | POA: Diagnosis not present

## 2017-11-21 DIAGNOSIS — Z87891 Personal history of nicotine dependence: Secondary | ICD-10-CM | POA: Diagnosis not present

## 2017-11-21 DIAGNOSIS — D6832 Hemorrhagic disorder due to extrinsic circulating anticoagulants: Secondary | ICD-10-CM | POA: Diagnosis not present

## 2017-11-21 DIAGNOSIS — I10 Essential (primary) hypertension: Secondary | ICD-10-CM | POA: Diagnosis not present

## 2017-11-21 DIAGNOSIS — Z95810 Presence of automatic (implantable) cardiac defibrillator: Secondary | ICD-10-CM | POA: Diagnosis not present

## 2017-11-21 DIAGNOSIS — R809 Proteinuria, unspecified: Secondary | ICD-10-CM | POA: Diagnosis not present

## 2017-11-21 DIAGNOSIS — Z952 Presence of prosthetic heart valve: Secondary | ICD-10-CM | POA: Diagnosis not present

## 2017-11-21 DIAGNOSIS — E559 Vitamin D deficiency, unspecified: Secondary | ICD-10-CM | POA: Diagnosis not present

## 2017-11-21 DIAGNOSIS — J441 Chronic obstructive pulmonary disease with (acute) exacerbation: Secondary | ICD-10-CM | POA: Diagnosis not present

## 2017-11-21 DIAGNOSIS — M4310 Spondylolisthesis, site unspecified: Secondary | ICD-10-CM | POA: Diagnosis not present

## 2017-11-21 DIAGNOSIS — Z7901 Long term (current) use of anticoagulants: Secondary | ICD-10-CM | POA: Diagnosis not present

## 2017-11-21 DIAGNOSIS — M5136 Other intervertebral disc degeneration, lumbar region: Secondary | ICD-10-CM | POA: Diagnosis not present

## 2017-11-21 NOTE — Telephone Encounter (Signed)
[name] [name], Physical Therapist with [facility], left message on nurse line stating that patient has recently been discharged from the hospital and had CHF, COPD exacerbation and general weakness. She is asking for verbal orders to see patient twice a week for 4 weeks. She was also asking about getting some help in the home and I heard her mention social worker but the message got staticy so I am not sure what she was asking for.  Callback# [phone]

## 2017-11-22 NOTE — Telephone Encounter (Signed)
Verbal order left on voicemail and advised her to call back if there was anything other orders that were needed that we didn't hear on the message

## 2017-11-23 DIAGNOSIS — M5136 Other intervertebral disc degeneration, lumbar region: Secondary | ICD-10-CM | POA: Diagnosis not present

## 2017-11-23 DIAGNOSIS — D6832 Hemorrhagic disorder due to extrinsic circulating anticoagulants: Secondary | ICD-10-CM | POA: Diagnosis not present

## 2017-11-23 DIAGNOSIS — J441 Chronic obstructive pulmonary disease with (acute) exacerbation: Secondary | ICD-10-CM | POA: Diagnosis not present

## 2017-11-23 DIAGNOSIS — I5023 Acute on chronic systolic (congestive) heart failure: Secondary | ICD-10-CM | POA: Diagnosis not present

## 2017-11-23 DIAGNOSIS — N184 Chronic kidney disease, stage 4 (severe): Secondary | ICD-10-CM | POA: Diagnosis not present

## 2017-11-23 DIAGNOSIS — I130 Hypertensive heart and chronic kidney disease with heart failure and stage 1 through stage 4 chronic kidney disease, or unspecified chronic kidney disease: Secondary | ICD-10-CM | POA: Diagnosis not present

## 2017-11-27 ENCOUNTER — Other Ambulatory Visit: Payer: Self-pay | Admitting: Family Medicine

## 2017-11-27 DIAGNOSIS — I5023 Acute on chronic systolic (congestive) heart failure: Secondary | ICD-10-CM | POA: Diagnosis not present

## 2017-11-27 DIAGNOSIS — M5136 Other intervertebral disc degeneration, lumbar region: Secondary | ICD-10-CM | POA: Diagnosis not present

## 2017-11-27 DIAGNOSIS — N184 Chronic kidney disease, stage 4 (severe): Secondary | ICD-10-CM | POA: Diagnosis not present

## 2017-11-27 DIAGNOSIS — J441 Chronic obstructive pulmonary disease with (acute) exacerbation: Secondary | ICD-10-CM | POA: Diagnosis not present

## 2017-11-27 DIAGNOSIS — I130 Hypertensive heart and chronic kidney disease with heart failure and stage 1 through stage 4 chronic kidney disease, or unspecified chronic kidney disease: Secondary | ICD-10-CM | POA: Diagnosis not present

## 2017-11-27 DIAGNOSIS — D6832 Hemorrhagic disorder due to extrinsic circulating anticoagulants: Secondary | ICD-10-CM | POA: Diagnosis not present

## 2017-11-28 DIAGNOSIS — I10 Essential (primary) hypertension: Secondary | ICD-10-CM | POA: Diagnosis not present

## 2017-11-28 DIAGNOSIS — D638 Anemia in other chronic diseases classified elsewhere: Secondary | ICD-10-CM | POA: Diagnosis not present

## 2017-11-28 DIAGNOSIS — N183 Chronic kidney disease, stage 3 (moderate): Secondary | ICD-10-CM | POA: Diagnosis not present

## 2017-11-28 DIAGNOSIS — Z79899 Other long term (current) drug therapy: Secondary | ICD-10-CM | POA: Diagnosis not present

## 2017-11-28 DIAGNOSIS — D509 Iron deficiency anemia, unspecified: Secondary | ICD-10-CM | POA: Diagnosis not present

## 2017-11-28 DIAGNOSIS — R809 Proteinuria, unspecified: Secondary | ICD-10-CM | POA: Diagnosis not present

## 2017-11-28 DIAGNOSIS — N184 Chronic kidney disease, stage 4 (severe): Secondary | ICD-10-CM | POA: Diagnosis not present

## 2017-11-29 ENCOUNTER — Telehealth: Payer: Self-pay | Admitting: Family Medicine

## 2017-11-29 DIAGNOSIS — I5023 Acute on chronic systolic (congestive) heart failure: Secondary | ICD-10-CM | POA: Diagnosis not present

## 2017-11-29 DIAGNOSIS — M5136 Other intervertebral disc degeneration, lumbar region: Secondary | ICD-10-CM | POA: Diagnosis not present

## 2017-11-29 DIAGNOSIS — J441 Chronic obstructive pulmonary disease with (acute) exacerbation: Secondary | ICD-10-CM | POA: Diagnosis not present

## 2017-11-29 DIAGNOSIS — N184 Chronic kidney disease, stage 4 (severe): Secondary | ICD-10-CM | POA: Diagnosis not present

## 2017-11-29 DIAGNOSIS — D6832 Hemorrhagic disorder due to extrinsic circulating anticoagulants: Secondary | ICD-10-CM | POA: Diagnosis not present

## 2017-11-29 DIAGNOSIS — I130 Hypertensive heart and chronic kidney disease with heart failure and stage 1 through stage 4 chronic kidney disease, or unspecified chronic kidney disease: Secondary | ICD-10-CM | POA: Diagnosis not present

## 2017-11-29 NOTE — Telephone Encounter (Signed)
Please call the pt regarding Medicine, per the VM she needs you to call her

## 2017-11-30 ENCOUNTER — Other Ambulatory Visit: Payer: Self-pay

## 2017-11-30 MED ORDER — FLUTICASONE PROPIONATE 50 MCG/ACT NA SUSP: 2.0000 | Freq: Every day | NASAL | 1 refills | Status: DC

## 2017-11-30 MED ORDER — POTASSIUM CHLORIDE CRYS ER 20 MEQ PO TBCR: EXTENDED_RELEASE_TABLET | ORAL | 1 refills | Status: DC

## 2017-11-30 MED ORDER — METOPROLOL SUCCINATE ER 100 MG PO TB24: 100.0000 mg | ORAL_TABLET | Freq: Every day | ORAL | 1 refills | Status: DC

## 2017-11-30 NOTE — Telephone Encounter (Signed)
meds refilled 

## 2017-12-03 ENCOUNTER — Ambulatory Visit (INDEPENDENT_AMBULATORY_CARE_PROVIDER_SITE_OTHER): Payer: Medicare Other | Admitting: *Deleted

## 2017-12-03 ENCOUNTER — Encounter: Payer: Self-pay | Admitting: Family Medicine

## 2017-12-03 DIAGNOSIS — I481 Persistent atrial fibrillation: Secondary | ICD-10-CM | POA: Diagnosis not present

## 2017-12-03 DIAGNOSIS — Z5181 Encounter for therapeutic drug level monitoring: Secondary | ICD-10-CM | POA: Diagnosis not present

## 2017-12-03 DIAGNOSIS — I4819 Other persistent atrial fibrillation: Secondary | ICD-10-CM

## 2017-12-03 DIAGNOSIS — Z952 Presence of prosthetic heart valve: Secondary | ICD-10-CM | POA: Diagnosis not present

## 2017-12-03 LAB — POCT INR: INR: 2.9

## 2017-12-03 NOTE — Patient Instructions (Signed)
Take coumadin 1 tablet tonight then resume 2 tablets daily except 1 tablet on Tuesdays, Thursdays and Saturdays.  Recheck in 4 weeks.

## 2017-12-04 DIAGNOSIS — J441 Chronic obstructive pulmonary disease with (acute) exacerbation: Secondary | ICD-10-CM | POA: Diagnosis not present

## 2017-12-04 DIAGNOSIS — I130 Hypertensive heart and chronic kidney disease with heart failure and stage 1 through stage 4 chronic kidney disease, or unspecified chronic kidney disease: Secondary | ICD-10-CM | POA: Diagnosis not present

## 2017-12-04 DIAGNOSIS — M5136 Other intervertebral disc degeneration, lumbar region: Secondary | ICD-10-CM | POA: Diagnosis not present

## 2017-12-04 DIAGNOSIS — N184 Chronic kidney disease, stage 4 (severe): Secondary | ICD-10-CM | POA: Diagnosis not present

## 2017-12-04 DIAGNOSIS — D6832 Hemorrhagic disorder due to extrinsic circulating anticoagulants: Secondary | ICD-10-CM | POA: Diagnosis not present

## 2017-12-04 DIAGNOSIS — I5023 Acute on chronic systolic (congestive) heart failure: Secondary | ICD-10-CM | POA: Diagnosis not present

## 2017-12-06 DIAGNOSIS — N184 Chronic kidney disease, stage 4 (severe): Secondary | ICD-10-CM | POA: Diagnosis not present

## 2017-12-06 DIAGNOSIS — I130 Hypertensive heart and chronic kidney disease with heart failure and stage 1 through stage 4 chronic kidney disease, or unspecified chronic kidney disease: Secondary | ICD-10-CM | POA: Diagnosis not present

## 2017-12-06 DIAGNOSIS — D6832 Hemorrhagic disorder due to extrinsic circulating anticoagulants: Secondary | ICD-10-CM | POA: Diagnosis not present

## 2017-12-06 DIAGNOSIS — J441 Chronic obstructive pulmonary disease with (acute) exacerbation: Secondary | ICD-10-CM | POA: Diagnosis not present

## 2017-12-06 DIAGNOSIS — I5023 Acute on chronic systolic (congestive) heart failure: Secondary | ICD-10-CM | POA: Diagnosis not present

## 2017-12-06 DIAGNOSIS — M5136 Other intervertebral disc degeneration, lumbar region: Secondary | ICD-10-CM | POA: Diagnosis not present

## 2017-12-07 ENCOUNTER — Telehealth: Payer: Self-pay

## 2017-12-07 DIAGNOSIS — I130 Hypertensive heart and chronic kidney disease with heart failure and stage 1 through stage 4 chronic kidney disease, or unspecified chronic kidney disease: Secondary | ICD-10-CM | POA: Diagnosis not present

## 2017-12-07 DIAGNOSIS — J441 Chronic obstructive pulmonary disease with (acute) exacerbation: Secondary | ICD-10-CM | POA: Diagnosis not present

## 2017-12-07 DIAGNOSIS — I5023 Acute on chronic systolic (congestive) heart failure: Secondary | ICD-10-CM | POA: Diagnosis not present

## 2017-12-07 DIAGNOSIS — N184 Chronic kidney disease, stage 4 (severe): Secondary | ICD-10-CM | POA: Diagnosis not present

## 2017-12-07 NOTE — Assessment & Plan Note (Signed)
improved markedly, denies excessive dyspnea, cough  Or leg swelling, denies chest pain or PND Hospital course reviewed. Post hospital labs ordered , CBC and diff and chem 7 and eGFR and are essentially stable  BUN is 84 with a creatinine of 1.7 and her HB  Is 10.9 absolute neutrophils 7.1

## 2017-12-07 NOTE — Progress Notes (Signed)
[name] [name]     MRN: [id number]      DOB: 03-07-1932   HPI [name] [name] is here for follow up of recent hospitalization fron 2/21.to 11/17/2017 for acute CHF She also had COPD exacerbation at the time of admission for which she was treated with steroids. Reports marked improvement in both conditions Still somewhat weak, had been hospitalized less than 1 month before because of influenza She denies any current leg swelling , excessive shortness of breath or PND and is using her regular number of pillows. ROS Denies recent fever or chills. Denies sinus pressure, nasal congestion, ear pain or sore throat. Denies chest congestion, productive cough or wheezing. Denies chest pains, palpitations and leg swelling Denies abdominal pain, nausea, vomiting,diarrhea or constipation.   Denies dysuria, frequency, hesitancy or incontinence. C/o chronic  joint pain,  and limitation in mobility. Denies headaches, seizures, numbness, or tingling. Denies depression, anxiety or insomnia. Has bruising where she was getting blood drawws and IV medications in the hospital.   PE  BP 124/74   Pulse 79   Resp 16   Ht 5' 7" (1.702 m)   Wt 216 lb (98 kg)   SpO2 95%   BMI 33.83 kg/m   Patient alert and oriented and in no cardiopulmonary distress.  HEENT: No facial asymmetry, EOMI,   oropharynx pink and moist.  Neck decreased ROM no JVD, no mass.  Chest: adequate air entry, bi basilar crackles CVS: S1, S2 systolic  Murmur, click irRegular rate.  ABD: Soft non tender.   Ext: No edema  MS: decreased  ROM spine, shoulders, hips and knees.  Skin: Intact, no ulcerations or rash noted.  Psych: Good eye contact, normal affect. Memory intact not anxious or depressed appearing.  CNS: CN 2-12 intact, power,  normal throughout.no focal deficits noted.   [facility] discharge follow-up improved markedly, denies excessive dyspnea, cough  Or leg swelling, denies chest pain or  PND Hospital course reviewed. Post hospital labs ordered , CBC and diff and chem 7 and eGFR and are essentially stable  BUN is 84 with a creatinine of 1.7 and her HB  Is 10.9 absolute neutrophils 7.1

## 2017-12-09 ENCOUNTER — Other Ambulatory Visit: Payer: Self-pay

## 2017-12-09 ENCOUNTER — Emergency Department (HOSPITAL_COMMUNITY): Payer: Medicare Other

## 2017-12-09 ENCOUNTER — Encounter (HOSPITAL_COMMUNITY): Payer: Self-pay | Admitting: Emergency Medicine

## 2017-12-09 ENCOUNTER — Inpatient Hospital Stay (HOSPITAL_COMMUNITY)
Admission: EM | Admit: 2017-12-09 | Discharge: 2017-12-11 | DRG: 291 | Disposition: A | Discharge status: Home Health | Payer: Medicare Other | Attending: Family Medicine | Admitting: Family Medicine

## 2017-12-09 DIAGNOSIS — E785 Hyperlipidemia, unspecified: Secondary | ICD-10-CM | POA: Diagnosis present

## 2017-12-09 DIAGNOSIS — E8770 Fluid overload, unspecified: Secondary | ICD-10-CM | POA: Diagnosis not present

## 2017-12-09 DIAGNOSIS — M4310 Spondylolisthesis, site unspecified: Secondary | ICD-10-CM | POA: Diagnosis present

## 2017-12-09 DIAGNOSIS — Z87891 Personal history of nicotine dependence: Secondary | ICD-10-CM

## 2017-12-09 DIAGNOSIS — J209 Acute bronchitis, unspecified: Secondary | ICD-10-CM | POA: Diagnosis present

## 2017-12-09 DIAGNOSIS — M479 Spondylosis, unspecified: Secondary | ICD-10-CM | POA: Diagnosis present

## 2017-12-09 DIAGNOSIS — D649 Anemia, unspecified: Secondary | ICD-10-CM | POA: Diagnosis not present

## 2017-12-09 DIAGNOSIS — I5043 Acute on chronic combined systolic (congestive) and diastolic (congestive) heart failure: Secondary | ICD-10-CM | POA: Diagnosis present

## 2017-12-09 DIAGNOSIS — Z79899 Other long term (current) drug therapy: Secondary | ICD-10-CM

## 2017-12-09 DIAGNOSIS — N189 Chronic kidney disease, unspecified: Secondary | ICD-10-CM

## 2017-12-09 DIAGNOSIS — K219 Gastro-esophageal reflux disease without esophagitis: Secondary | ICD-10-CM | POA: Diagnosis present

## 2017-12-09 DIAGNOSIS — Z888 Allergy status to other drugs, medicaments and biological substances status: Secondary | ICD-10-CM

## 2017-12-09 DIAGNOSIS — I442 Atrioventricular block, complete: Secondary | ICD-10-CM | POA: Diagnosis present

## 2017-12-09 DIAGNOSIS — D631 Anemia in chronic kidney disease: Secondary | ICD-10-CM | POA: Diagnosis present

## 2017-12-09 DIAGNOSIS — N181 Chronic kidney disease, stage 1: Secondary | ICD-10-CM | POA: Diagnosis present

## 2017-12-09 DIAGNOSIS — E1122 Type 2 diabetes mellitus with diabetic chronic kidney disease: Secondary | ICD-10-CM | POA: Diagnosis not present

## 2017-12-09 DIAGNOSIS — Z885 Allergy status to narcotic agent status: Secondary | ICD-10-CM

## 2017-12-09 DIAGNOSIS — I080 Rheumatic disorders of both mitral and aortic valves: Secondary | ICD-10-CM | POA: Diagnosis present

## 2017-12-09 DIAGNOSIS — R778 Other specified abnormalities of plasma proteins: Secondary | ICD-10-CM

## 2017-12-09 DIAGNOSIS — K5900 Constipation, unspecified: Secondary | ICD-10-CM | POA: Diagnosis present

## 2017-12-09 DIAGNOSIS — Z886 Allergy status to analgesic agent status: Secondary | ICD-10-CM

## 2017-12-09 DIAGNOSIS — I10 Essential (primary) hypertension: Secondary | ICD-10-CM | POA: Diagnosis present

## 2017-12-09 DIAGNOSIS — I517 Cardiomegaly: Secondary | ICD-10-CM | POA: Diagnosis not present

## 2017-12-09 DIAGNOSIS — I130 Hypertensive heart and chronic kidney disease with heart failure and stage 1 through stage 4 chronic kidney disease, or unspecified chronic kidney disease: Principal | ICD-10-CM | POA: Diagnosis present

## 2017-12-09 DIAGNOSIS — J449 Chronic obstructive pulmonary disease, unspecified: Secondary | ICD-10-CM | POA: Diagnosis not present

## 2017-12-09 DIAGNOSIS — R0600 Dyspnea, unspecified: Secondary | ICD-10-CM

## 2017-12-09 DIAGNOSIS — R791 Abnormal coagulation profile: Secondary | ICD-10-CM | POA: Diagnosis present

## 2017-12-09 DIAGNOSIS — I481 Persistent atrial fibrillation: Secondary | ICD-10-CM | POA: Diagnosis present

## 2017-12-09 DIAGNOSIS — I429 Cardiomyopathy, unspecified: Secondary | ICD-10-CM | POA: Diagnosis present

## 2017-12-09 DIAGNOSIS — E890 Postprocedural hypothyroidism: Secondary | ICD-10-CM | POA: Diagnosis present

## 2017-12-09 DIAGNOSIS — R7303 Prediabetes: Secondary | ICD-10-CM | POA: Diagnosis present

## 2017-12-09 DIAGNOSIS — Z7901 Long term (current) use of anticoagulants: Secondary | ICD-10-CM

## 2017-12-09 DIAGNOSIS — M109 Gout, unspecified: Secondary | ICD-10-CM | POA: Diagnosis present

## 2017-12-09 DIAGNOSIS — Z880 Allergy status to penicillin: Secondary | ICD-10-CM

## 2017-12-09 DIAGNOSIS — Z6833 Body mass index (BMI) 33.0-33.9, adult: Secondary | ICD-10-CM

## 2017-12-09 DIAGNOSIS — I129 Hypertensive chronic kidney disease with stage 1 through stage 4 chronic kidney disease, or unspecified chronic kidney disease: Secondary | ICD-10-CM | POA: Diagnosis not present

## 2017-12-09 DIAGNOSIS — R748 Abnormal levels of other serum enzymes: Secondary | ICD-10-CM | POA: Diagnosis not present

## 2017-12-09 DIAGNOSIS — Z95810 Presence of automatic (implantable) cardiac defibrillator: Secondary | ICD-10-CM

## 2017-12-09 DIAGNOSIS — R7989 Other specified abnormal findings of blood chemistry: Secondary | ICD-10-CM

## 2017-12-09 DIAGNOSIS — Z952 Presence of prosthetic heart valve: Secondary | ICD-10-CM

## 2017-12-09 DIAGNOSIS — H409 Unspecified glaucoma: Secondary | ICD-10-CM | POA: Diagnosis present

## 2017-12-09 DIAGNOSIS — Z7951 Long term (current) use of inhaled steroids: Secondary | ICD-10-CM

## 2017-12-09 DIAGNOSIS — R42 Dizziness and giddiness: Secondary | ICD-10-CM | POA: Diagnosis present

## 2017-12-09 DIAGNOSIS — M47816 Spondylosis without myelopathy or radiculopathy, lumbar region: Secondary | ICD-10-CM | POA: Diagnosis present

## 2017-12-09 LAB — BASIC METABOLIC PANEL
Anion gap: 12 (ref 5–15)
BUN: 51 mg/dL — ABNORMAL HIGH (ref 6–20)
CO2: 27 mmol/L (ref 22–32)
Calcium: 9.5 mg/dL (ref 8.9–10.3)
Chloride: 104 mmol/L (ref 101–111)
Creatinine, Ser: 1.86 mg/dL — ABNORMAL HIGH (ref 0.44–1.00)
GFR calc Af Amer: 28 mL/min — ABNORMAL LOW (ref >60)
GFR calc non Af Amer: 24 mL/min — ABNORMAL LOW (ref >60)
Glucose, Bld: 121 mg/dL — ABNORMAL HIGH (ref 65–99)
Potassium: 4.1 mmol/L (ref 3.5–5.1)
Sodium: 143 mmol/L (ref 135–145)

## 2017-12-09 LAB — CBC WITH DIFFERENTIAL/PLATELET
Basophils Absolute: 0 10*3/uL (ref 0.0–0.1)
Basophils Relative: 0 %
Eosinophils Absolute: 0 10*3/uL (ref 0.0–0.7)
Eosinophils Relative: 0 %
HCT: 28.9 % — ABNORMAL LOW (ref 36.0–46.0)
Hemoglobin: 9 g/dL — ABNORMAL LOW (ref 12.0–15.0)
Lymphocytes Relative: 10 %
Lymphs Abs: 0.7 10*3/uL (ref 0.7–4.0)
MCH: 30 pg (ref 26.0–34.0)
MCHC: 31.1 g/dL (ref 30.0–36.0)
MCV: 96.3 fL (ref 78.0–100.0)
Monocytes Absolute: 0.6 10*3/uL (ref 0.1–1.0)
Monocytes Relative: 8 %
Neutro Abs: 5.5 10*3/uL (ref 1.7–7.7)
Neutrophils Relative %: 82 %
Platelets: 163 10*3/uL (ref 150–400)
RBC: 3 MIL/uL — ABNORMAL LOW (ref 3.87–5.11)
RDW: 16.4 % — ABNORMAL HIGH (ref 11.5–15.5)
WBC: 6.7 10*3/uL (ref 4.0–10.5)

## 2017-12-09 LAB — TROPONIN I: Troponin I: 0.07 ng/mL (ref <0.03)

## 2017-12-09 LAB — PROTIME-INR
INR: 2.95
Prothrombin Time: 30.5 seconds — ABNORMAL HIGH (ref 11.4–15.2)

## 2017-12-09 LAB — D-DIMER, QUANTITATIVE: D-Dimer, Quant: 0.62 ug/mL-FEU — ABNORMAL HIGH (ref 0.00–0.50)

## 2017-12-09 LAB — MAGNESIUM: Magnesium: 1.7 mg/dL (ref 1.7–2.4)

## 2017-12-09 MED ORDER — FUROSEMIDE 10 MG/ML IJ SOLN
40.0000 mg | Freq: Once | INTRAMUSCULAR | Status: AC
  Administered 2017-12-09: 40 mg via INTRAVENOUS
  Filled 2017-12-09: qty 4

## 2017-12-09 NOTE — ED Provider Notes (Signed)
[facility] EMERGENCY DEPARTMENT Provider Note   CSN: [id number] Arrival date & time: 12/09/17  2057     History   Chief Complaint Chief Complaint  Patient presents with  . Shortness of Breath    HPI [name] [name] is a 84 y.o. female.  Patient presents with worsening dyspnea and swelling in her lower extremities for the past several days.  She can only walk a very minimal distance before she becomes extremely dyspneic.  No anterior chest pain, diaphoresis, nausea.  Past medical history includes congestive heart failure, chronic kidney disease, COPD, diabetes, many others.  Severity of symptoms is moderate.  Exertion makes symptoms worse.      Past Medical History:  Diagnosis Date  . Adenomatous polyp of colon 12/16/2002   Dr. [name]/[facility]  . Allergy   . Cataract   . CHB (complete heart block) ([place]) 10/07/2014      . CHF (congestive heart failure) ([place])       . Chronic kidney disease, stage I 2006  . Constipation       . COPD (chronic obstructive pulmonary disease) ([place])       . DJD (degenerative joint disease) of lumbar spine   . DM (diabetes mellitus) ([place]) 2006  . Esophageal reflux   . Glaucoma   . Gout       . Heart murmur   . Hyperlipemia   . IDA (iron deficiency anemia)    Parenteral iron/[name]  . Insomnia       . Non-ischemic cardiomyopathy ([place])    a. s/p MDT CRTD  . Obesity, unspecified   . Other and unspecified hyperlipidemia   . Oxygen deficiency 2014   nocturnal;  . Spondylolisthesis   . Spondylosis   . Thyroid cancer ([place])    remote thyroidectomy, no recurrence, pt denies in 2016 thyroid cancer  . Unspecified hypothyroidism       . 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 Vertigo         Patient Active Problem List   Diagnosis Date Noted  . Acute on chronic systolic CHF (congestive heart failure) ([place]) 11/15/2017  . COPD with acute exacerbation ([place]) 11/15/2017  .  Hospital discharge follow-up 11/07/2017  . Influenza 11/01/2017  . Trochanteric bursitis, right hip 03/07/2017  . SOB (shortness of breath) 11/16/2016  . Persistent atrial fibrillation ([place])   . Nocturnal hypoxia 10/08/2016  . Dependence on nocturnal oxygen therapy 10/08/2016  . S/P ICD (internal cardiac defibrillator) procedure 02/08/2015  . CHB (complete heart block) ([place]) 10/07/2014  . Fatigue 07/20/2014  . Anemia 06/24/2014  . Osteopenia 02/10/2014  . Encounter for therapeutic drug monitoring 10/29/2013  . Spinal stenosis of lumbar region 02/19/2013  . OA (osteoarthritis) of knee 02/19/2013  . Chronic pain of right knee 01/02/2013  . Hemolytic anemia ([place]) 09/24/2012  . H/O adenomatous polyp of colon 05/24/2012  . High risk medication use 05/24/2012  . COPD (chronic obstructive pulmonary disease) ([place]) 05/19/2012  . Chronic systolic heart failure ([place]) 05/19/2012  . Chronic anticoagulation, on coumadin for Mechanical AVR and MVR 04/01/2012  . Cardiorenal syndrome with renal failure 03/22/2012  . S/P AVR (aortic valve replacement) 03/22/2012  . S/P MVR (mitral valve replacement) 03/22/2012  . AICD (automatic cardioverter/defibrillator) present 03/22/2012  . CKD (chronic kidney disease), stage IV ([place]) 03/22/2012  . Acute respiratory failure with hypoxia ([place]) 03/21/2012  . Warfarin-induced coagulopathy ([place]) 03/21/2012  . Papilloma of breast 03/06/2012  .  Iron deficiency 10/04/2011  . Allergic rhinitis 02/14/2011  . Nausea without vomiting 06/07/2010  . HIP PAIN, RIGHT 04/23/2009  . VARICOSE VEINS LOWER EXTREMITIES W/OTH COMPS 02/09/2009  . Calculus of gallbladder with chronic cholecystitis without obstruction 02/09/2009  . Prediabetes 06/15/2008  . Hypothyroidism, postsurgical 02/24/2008  . Hyperlipidemia LDL goal <70 02/24/2008  . Mild obesity 02/24/2008  . Essential hypertension 02/24/2008  . Non-ischemic cardiomyopathy with ICD 02/24/2008  . GERD 02/24/2008  .  DEGENERATIVE JOINT DISEASE, SPINE 02/24/2008    Past Surgical History:  Procedure Laterality Date  . AORTIC AND MITRAL VALVE REPLACEMENT  2001  . BI-VENTRICULAR IMPLANTABLE CARDIOVERTER DEFIBRILLATOR UPGRADE N/A 01/18/2015   Procedure: BI-VENTRICULAR IMPLANTABLE CARDIOVERTER DEFIBRILLATOR UPGRADE;  Surgeon: Evans Lance, MD;  Location[facility] CATH LAB;  Service: Cardiovascular;  Laterality: N/A;  . CARDIAC CATHETERIZATION    . CARDIAC VALVE REPLACEMENT    . CARDIOVERSION N/A 11/13/2016   Procedure: CARDIOVERSION;  Surgeon: Sanda Klein, MD;  Location: [facility] ENDOSCOPY;  Service: Cardiovascular;  Laterality: N/A;  . COLONOSCOPY  06/12/2007   [place]k- Normal rectum/Normal colon  . COLONOSCOPY  12/16/02   small hemorrhoids  . COLONOSCOPY  06/20/2012   Procedure: COLONOSCOPY;  Surgeon: Daneil Dolin, MD;  Location: [facility] ENDO SUITE;  Service: Endoscopy;  Laterality: N/A;  10:45  . defibrillator implanted 2006    . DOPPLER ECHOCARDIOGRAPHY  2012  . DOPPLER ECHOCARDIOGRAPHY  05,06,07,08,09,2011  . ESOPHAGOGASTRODUODENOSCOPY  06/12/2007   Rourk- normal esophagus, small hiatal hernia, otherwise normal stomach, D1, D2  . EYE SURGERY Right 2005  . EYE SURGERY Left 2006  . ICD LEAD REMOVAL Left 02/08/2015   Procedure: ICD LEAD REMOVAL;  Surgeon: Evans Lance, MD;  Location: [facility] OR;  Service: Cardiovascular;  Laterality: Left;  "Will plan extraction and insertion of a BiV PM"  **Dr. Roxan Hockey backing up case**  . IMPLANTABLE CARDIOVERTER DEFIBRILLATOR (ICD) GENERATOR CHANGE Left 02/08/2015   Procedure: ICD GENERATOR CHANGE;  Surgeon: Evans Lance, MD;  Location: [place];  Service: Cardiovascular;  Laterality: Left;  . INSERT / REPLACE / REMOVE PACEMAKER    . PACEMAKER INSERTION  May 2016  . pacemaker placed  2004  . right breast cyst removed     benign   . right cataract removed     2005  . TEE WITHOUT CARDIOVERSION N/A 11/13/2016   Procedure: TRANSESOPHAGEAL ECHOCARDIOGRAM (TEE);  Surgeon: Sanda Klein, MD;  Location[facility] ENDOSCOPY;  Service: Cardiovascular;  Laterality: N/A;  . THYROIDECTOMY      OB History    Gravida Para Term Preterm AB Living             0   SAB TAB Ectopic Multiple Live Births                   Home Medications    Prior to Admission medications   Medication Sig Start Date End Date Taking? Authorizing Provider  acetaminophen (TYLENOL) 500 MG tablet Take 500 mg by mouth every 6 (six) hours as needed (pain).    [provider]  albuterol (PROVENTIL) (2.5 MG/3ML) 0.083% nebulizer solution INHALE 1 VIAL VIA NEBULIZER THREE TIMES DAILY. Patient taking differently: INHALE 1 VIAL VIA NEBULIZER THREE TIMES DAILY AS NEEDED 12/28/16   Fayrene Helper, MD  albuterol (VENTOLIN HFA) 108 (90 Base) MCG/ACT inhaler Inhale 2 puffs into the lungs every 6 (six) hours as needed for wheezing or shortness of breath. 10/19/16   Fayrene Helper, MD  allopurinol (ZYLOPRIM) 100 MG  tablet Take 1 tablet (100 mg total) by mouth daily. 10/18/17   Fayrene Helper, MD  azelastine (ASTELIN) 0.1 % nasal spray Place 2 sprays into both nostrils 2 (two) times daily. Use in each nostril as directed Patient taking differently: Place 2 sprays into both nostrils daily as needed for allergies. Use in each nostril as directed 10/04/15   Fayrene Helper, MD  BREO ELLIPTA 100-25 MCG/INH AEPB Inhale 1 puff into the lungs daily.  10/04/17   [provider]  calcium carbonate (OS-CAL) 1250 (500 Ca) MG chewable tablet Chew 1 tablet by mouth daily. 07/15/10   [provider]  cholecalciferol (VITAMIN D) 1000 UNITS tablet Take 1,000 Units by mouth daily.    [provider]  COUMADIN 5 MG tablet Take 2 tablets daily except 1 tablet on Tuesdays Patient taking differently: Take 1 tablet Tuesday, Thursday, and Saturday and  Take 2 tablets all other days. 09/14/17   Evans Lance, MD  Darbepoetin Alfa (ARANESP, ALBUMIN FREE, IJ) EVERY THURSDAY 07/15/10   [provider]  digoxin (LANOXIN) 0.125 MG tablet Take 1 tablet (0.125 mg total) by mouth every other day. 07/23/17   Lendon Colonel, NP  docusate sodium (COLACE) 100 MG capsule Take 300 mg by mouth at bedtime.    [provider]  fluticasone (FLONASE) 50 MCG/ACT nasal spray Place 2 sprays into both nostrils daily. Pt needs now 11/30/17   Fayrene Helper, MD  folic acid (FOLVITE) 1 MG tablet Take 1 tablet (1 mg total) by mouth daily. 05/01/17   Fayrene Helper, MD  furosemide (LASIX) 40 MG tablet Take 1 tablet by mouth  daily 07/23/17   Lendon Colonel, NP  gabapentin (NEURONTIN) 300 MG capsule Take 1 capsule (300 mg total) by mouth at bedtime. 11/19/17   Fayrene Helper, MD  guaiFENesin (MUCINEX) 600 MG 12 hr tablet Take 1 tablet (600 mg total) by mouth 2 (two) times daily. 11/17/17 11/17/18  Kathie Dike, MD  hydrALAZINE (APRESOLINE) 25 MG tablet Take 1 tablet (25 mg total) by mouth 3 (three) times daily. 07/23/17   Lendon Colonel, NP  HYDROcodone-acetaminophen (NORCO) 7.5-325 MG tablet Take 1 tablet by mouth every 6 (six) hours as needed for moderate pain (Must last 30 days.). 10/24/17   Sanjuana Kava, MD  isosorbide mononitrate (IMDUR) 60 MG 24 hr tablet Take 1 tablet (60 mg total) by mouth daily. 11/06/17   Croitoru, Mihai, MD  meclizine (ANTIVERT) 25 MG tablet Take 1 tablet (25 mg total) by mouth 3 (three) times daily as needed for dizziness. 05/01/17   Fayrene Helper, MD  metolazone (ZAROXOLYN) 2.5 MG tablet TAKE 1 TABLET ONCE A WEEK 07/23/17   Lendon Colonel, NP  metoprolol succinate (TOPROL XL) 100 MG 24 hr tablet Take 1 tablet (100 mg total) by mouth daily. Take with or immediately following a meal. 11/30/17   Fayrene Helper, MD  ondansetron (ZOFRAN) 4 MG tablet Take 1 tablet (4 mg total) by mouth daily as needed for nausea or vomiting. 05/01/17   Fayrene Helper, MD  Polyethyl [name] (SYSTANE OP) Place 1 drop into both eyes daily as  needed (dry eyes).    [provider]  potassium chloride SA (KLOR-CON M20) 20 MEQ tablet TAKE 1 TABLET DAILY 11/28/17   Fayrene Helper, MD  potassium chloride SA (KLOR-CON M20) 20 MEQ tablet Take 1 tablet by mouth  daily 11/30/17   Fayrene Helper, MD  pravastatin (  PRAVACHOL) 40 MG tablet Take 1 tablet (40 mg total) by mouth every evening. 07/23/17 11/01/18  Lendon Colonel, NP  predniSONE (DELTASONE) 10 MG tablet Take 40mg  po daily for 2 days then 30mg  daily for 2 days then 20mg  daily for 2 days then 10mg  daily for 2 days then stop 11/17/17   Kathie Dike, MD  SYNTHROID 75 MCG tablet Take 1 tablet by mouth  daily 10/18/17   Fayrene Helper, MD    Family History Family History  Problem Relation Age of Onset  . Pancreatic cancer Mother   . Heart disease Father   . Heart disease Sister   . Heart attack Sister   . Heart disease Brother   . Diabetes Brother   . Heart disease Brother   . Diabetes Brother   . Hypertension Brother   . Lung cancer Brother     Social History Social History   Tobacco Use  . Smoking status: Former Smoker    Packs/day: 0.75    Years: 40.00    Pack years: 30.00    Types: Cigarettes    Last attempt to quit: 03/08/1987    Years since quitting: 30.7  . Smokeless tobacco: Never Used  Substance Use Topics  . Alcohol use: No    Alcohol/week: 0.0 oz  . Drug use: No     Allergies   Penicillins; Aspirin; Fentanyl; and Niacin   Review of Systems Review of Systems  All other systems reviewed and are negative.    Physical Exam Updated Vital Signs BP (!) 143/59 (BP Location: Left Arm)   Pulse 75   Temp 99 F (37.2 C) (Oral)   Resp 19   SpO2 97%   Physical Exam  Constitutional: She is oriented to person, place, and time. She appears well-developed and well-nourished.  HENT:  Head: Normocephalic and atraumatic.  Eyes: Conjunctivae are normal.  Neck: Neck supple.  Cardiovascular: Normal rate and regular rhythm.    Pulmonary/Chest: Effort normal and breath sounds normal.  Abdominal: Soft. Bowel sounds are normal.  Musculoskeletal: Normal range of motion.  Neurological: She is alert and oriented to person, place, and time.  Skin: Skin is warm and dry.  Bilateral 3+ lower extremity edema.  Psychiatric: She has a normal mood and affect. Her behavior is normal.  Nursing note and vitals reviewed.    ED Treatments / Results  Labs (all labs ordered are listed, but only abnormal results are displayed) Labs Reviewed  CBC WITH DIFFERENTIAL/PLATELET - Abnormal; Notable for the following components:      Result Value   RBC 3.00 (*)    Hemoglobin 9.0 (*)    HCT 28.9 (*)    RDW 16.4 (*)    All other components within normal limits  BASIC METABOLIC PANEL - Abnormal; Notable for the following components:   Glucose, Bld 121 (*)    BUN 51 (*)    Creatinine, Ser 1.86 (*)    GFR calc non Af Amer 24 (*)    GFR calc Af Amer 28 (*)    All other components within normal limits  TROPONIN I - Abnormal; Notable for the following components:   Troponin I 0.07 (*)    All other components within normal limits  D-DIMER, QUANTITATIVE (NOT AT [facility]) - Abnormal; Notable for the following components:   D-Dimer, Quant 0.62 (*)    All other components within normal limits  TSH    EKG  EKG Interpretation None       Radiology  Dg Chest 2 View  Result Date: 12/09/2017 CLINICAL DATA:  Lower extremity edema EXAM: CHEST - 2 VIEW COMPARISON:  November 15, 2017 FINDINGS: There is no edema or consolidation. Heart is mildly enlarged with pulmonary vascularity within normal limits. There is aortic atherosclerosis. There are pacemaker leads attached to the right atrium, middle cardiac vein, and coronary sinus. There are mitral and aortic valve replacements. There is no evident adenopathy. No bone lesions. IMPRESSION: Cardiomegaly with postoperative changes as noted. Aortic atherosclerosis present. No edema or consolidation.  Aortic Atherosclerosis (ICD10-I70.0). Electronically Signed   By: Lowella Grip III M.D.   On: 12/09/2017 22:23    Procedures Procedures (including critical care time)  Medications Ordered in ED Medications  furosemide (LASIX) injection 40 mg (40 mg Intravenous Given 12/09/17 2231)     Initial Impression / Assessment and Plan / ED Course  I have reviewed the triage vital signs and the nursing notes.  Pertinent labs & imaging results that were available during my care of the patient were reviewed by me and considered in my medical decision making (see chart for details).     Patient presents with worsening dyspnea.  She has a history of heart failure.  Chest x-ray shows no obvious pulmonary edema.  D-dimer and troponin minimally elevated.  Cannot obtain CT angiogram of chest secondary to GFR.  Will discuss with hospitalist.  Final Clinical Impressions(s) / ED Diagnoses   Final diagnoses:  Dyspnea, unspecified type  Elevated troponin  Anemia, unspecified type  Chronic kidney disease, unspecified CKD stage    ED Discharge Orders    None       Nat Christen, MD 12/09/17 2332

## 2017-12-09 NOTE — ED Notes (Signed)
Date and time results received: 12/09/17 11:13 PM Test: troponin Critical Value: 0.07  Name of Provider Notified: dr.cook  Orders Received? Or Actions Taken?: md notified.

## 2017-12-09 NOTE — ED Triage Notes (Signed)
Hx of swelling of lower extremities for the last several days   Pt reports trying to work on it herself  Denies cough   Reports got real short of breath this evening   On home O2 at night O2/2l

## 2017-12-09 NOTE — H&P (Signed)
History and Physical    [name] [name] TUU:[id number] DOB: 04/18/1932 DOA: 12/09/2017  PCP: Fayrene Helper, MD   Patient coming from: Home.  I have personally briefly reviewed patient's old medical records in [place]  Chief Complaint: Shortness of breath and leg swelling.  HPI: [name] [name] is a 84 y.o. female with medical history significant of history of complete heart block (history of pacemaker and biventricular ICD), history of systolic CHF with greatly improved EF in the past 18 months (see attached results below from the two most recent echocardiograms), persistent atrial fibrillation last year, which has now resolved, stage I chronic kidney disease, constipation, colon polyps, allergies, cataracts, glaucoma, COPD, DJD of the lumbar spine spondylolisthesis/spondylosis, glucose intolerance/type 2 diabetes, GERD, gout, hyperlipidemia, iron deficiency anemia, insomnia, obesity, history of thyroid cancer, thyroidectomy, postsurgical hypothyroidism, vertigo who is coming to the emergency department with complaints of dyspnea and lower extremity edema for the last few days.  ED Course: Initial vital signs temperature 37.2C (99 F), pulse 94, respirations 18, blood pressure 120/75 mmHg and O2 sat 93% on room air.  The patient was given 40 mg of Lasix IVP in the ED.  EKG had a paced rhythm.  Troponin was 0.07 ng/mL.  BNP was 306 pg/mL.Her digoxin level was 0.5 ng/mL.  White count 6.7 with 82% neutrophils, 8% monocytes and 10% lymphocytes.  Hemoglobin 9.0 g/dL and platelets 163.  Her d-dimer was 0.62 mcg/mL.  PT was 30.5 seconds and INR 2.95.  Her BMP shows normal electrolytes, but her glucose was 121, BUN 51, creatinine 1.86 and magnesium 1.7 mg/dL.  Her chest radiograph showed cardiomegaly with posterior changes, but no edema.  Review of Systems: As per HPI otherwise 10 point review of systems negative.    Past Medical History:  Diagnosis Date  . Adenomatous polyp of  colon 12/16/2002   Dr. [name]/[facility]  . Allergy   . Cataract   . CHB (complete heart block) ([place]) 10/07/2014      . CHF (congestive heart failure) ([place])       . Chronic kidney disease, stage I 2006  . Constipation       . COPD (chronic obstructive pulmonary disease) ([place])       . DJD (degenerative joint disease) of lumbar spine   . DM (diabetes mellitus) ([place]) 2006  . Esophageal reflux   . Glaucoma   . Gout       . Heart murmur   . Hyperlipemia   . IDA (iron deficiency anemia)    Parenteral iron/[name]  . Insomnia       . Non-ischemic cardiomyopathy ([place])    a. s/p MDT CRTD  . Obesity, unspecified   . Other and unspecified hyperlipidemia   . Oxygen deficiency 2014   nocturnal;  . Spondylolisthesis   . Spondylosis   . Thyroid cancer ([place])    remote thyroidectomy, no recurrence, pt denies in 2016 thyroid cancer  . Unspecified hypothyroidism       . 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 Vertigo         Past Surgical History:  Procedure Laterality Date  . AORTIC AND MITRAL VALVE REPLACEMENT  2001  . BI-VENTRICULAR IMPLANTABLE CARDIOVERTER DEFIBRILLATOR UPGRADE N/A 01/18/2015   Procedure: BI-VENTRICULAR IMPLANTABLE CARDIOVERTER DEFIBRILLATOR UPGRADE;  Surgeon: Evans Lance, MD;  Location: [facility] CATH LAB;  Service: Cardiovascular;  Laterality: N/A;  . CARDIAC CATHETERIZATION    .  CARDIAC VALVE REPLACEMENT    . CARDIOVERSION N/A 11/13/2016   Procedure: CARDIOVERSION;  Surgeon: Sanda Klein, MD;  Location: [facility] ENDOSCOPY;  Service: Cardiovascular;  Laterality: N/A;  . COLONOSCOPY  06/12/2007   Rourk- Normal rectum/Normal colon  . COLONOSCOPY  12/16/02   small hemorrhoids  . COLONOSCOPY  06/20/2012   Procedure: COLONOSCOPY;  Surgeon: Daneil Dolin, MD;  Location: [facility] ENDO SUITE;  Service: Endoscopy;  Laterality: N/A;  10:45  . defibrillator implanted 2006    . DOPPLER ECHOCARDIOGRAPHY  2012  . DOPPLER  ECHOCARDIOGRAPHY  05,06,07,08,09,2011  . ESOPHAGOGASTRODUODENOSCOPY  06/12/2007   Rourk- normal esophagus, small hiatal hernia, otherwise normal stomach, D1, D2  . EYE SURGERY Right 2005  . EYE SURGERY Left 2006  . ICD LEAD REMOVAL Left 02/08/2015   Procedure: ICD LEAD REMOVAL;  Surgeon: Evans Lance, MD;  Location: [facility] OR;  Service: Cardiovascular;  Laterality: Left;  "Will plan extraction and insertion of a BiV PM"  **Dr. Roxan Hockey backing up case**  . IMPLANTABLE CARDIOVERTER DEFIBRILLATOR (ICD) GENERATOR CHANGE Left 02/08/2015   Procedure: ICD GENERATOR CHANGE;  Surgeon: Evans Lance, MD;  Location: [place];  Service: Cardiovascular;  Laterality: Left;  . INSERT / REPLACE / REMOVE PACEMAKER    . PACEMAKER INSERTION  May 2016  . pacemaker placed  2004  . right breast cyst removed     benign   . right cataract removed     2005  . TEE WITHOUT CARDIOVERSION N/A 11/13/2016   Procedure: TRANSESOPHAGEAL ECHOCARDIOGRAM (TEE);  Surgeon: Sanda Klein, MD;  Location: [facility] ENDOSCOPY;  Service: Cardiovascular;  Laterality: N/A;  . THYROIDECTOMY       reports that she quit smoking about 30 years ago. Her smoking use included cigarettes. She has a 30.00 pack-year smoking history. she has never used smokeless tobacco. She reports that she does not drink alcohol or use drugs.  Allergies  Allergen Reactions  . Penicillins Other (See Comments)    Bruise Has patient had a PCN reaction causing immediate rash, facial/tongue/throat swelling, SOB or lightheadedness with hypotension: no Has patient had a PCN reaction causing severe rash involving mucus membranes or skin necrosis: no Has patient had a PCN reaction that required hospitalization: no Has patient had a PCN reaction occurring within the last 10 years: no If all of the above answers are "NO", then may proceed with Cephalosporin use.   . Aspirin Other (See Comments)    On coumadin  . Fentanyl Dermatitis    Rash with patch   . Niacin  Itching    Family History  Problem Relation Age of Onset  . Pancreatic cancer Mother   . Heart disease Father   . Heart disease Sister   . Heart attack Sister   . Heart disease Brother   . Diabetes Brother   . Heart disease Brother   . Diabetes Brother   . Hypertension Brother   . Lung cancer Brother     Prior to Admission medications   Medication Sig Start Date End Date Taking? Authorizing Provider  acetaminophen (TYLENOL) 500 MG tablet Take 500 mg by mouth every 6 (six) hours as needed (pain).    [provider]  albuterol (PROVENTIL) (2.5 MG/3ML) 0.083% nebulizer solution INHALE 1 VIAL VIA NEBULIZER THREE TIMES DAILY. Patient taking differently: INHALE 1 VIAL VIA NEBULIZER THREE TIMES DAILY AS NEEDED 12/28/16   Fayrene Helper, MD  albuterol (VENTOLIN HFA) 108 (90 Base) MCG/ACT inhaler Inhale 2 puffs into the lungs every 6 (  six) hours as needed for wheezing or shortness of breath. 10/19/16   Fayrene Helper, MD  allopurinol (ZYLOPRIM) 100 MG tablet Take 1 tablet (100 mg total) by mouth daily. 10/18/17   Fayrene Helper, MD  azelastine (ASTELIN) 0.1 % nasal spray Place 2 sprays into both nostrils 2 (two) times daily. Use in each nostril as directed Patient taking differently: Place 2 sprays into both nostrils daily as needed for allergies. Use in each nostril as directed 10/04/15   Fayrene Helper, MD  BREO ELLIPTA 100-25 MCG/INH AEPB Inhale 1 puff into the lungs daily.  10/04/17   [provider]  calcium carbonate (OS-CAL) 1250 (500 Ca) MG chewable tablet Chew 1 tablet by mouth daily. 07/15/10   [provider]  cholecalciferol (VITAMIN D) 1000 UNITS tablet Take 1,000 Units by mouth daily.    [provider]  COUMADIN 5 MG tablet Take 2 tablets daily except 1 tablet on Tuesdays Patient taking differently: Take 1 tablet Tuesday, Thursday, and Saturday and  Take 2 tablets all other days. 09/14/17   Evans Lance, MD  Darbepoetin Alfa  (ARANESP, ALBUMIN FREE, IJ) EVERY THURSDAY 07/15/10   [provider]  digoxin (LANOXIN) 0.125 MG tablet Take 1 tablet (0.125 mg total) by mouth every other day. 07/23/17   Lendon Colonel, NP  docusate sodium (COLACE) 100 MG capsule Take 300 mg by mouth at bedtime.    [provider]  fluticasone (FLONASE) 50 MCG/ACT nasal spray Place 2 sprays into both nostrils daily. Pt needs now 11/30/17   Fayrene Helper, MD  folic acid (FOLVITE) 1 MG tablet Take 1 tablet (1 mg total) by mouth daily. 05/01/17   Fayrene Helper, MD  furosemide (LASIX) 40 MG tablet Take 1 tablet by mouth  daily 07/23/17   Lendon Colonel, NP  gabapentin (NEURONTIN) 300 MG capsule Take 1 capsule (300 mg total) by mouth at bedtime. 11/19/17   Fayrene Helper, MD  guaiFENesin (MUCINEX) 600 MG 12 hr tablet Take 1 tablet (600 mg total) by mouth 2 (two) times daily. 11/17/17 11/17/18  Kathie Dike, MD  hydrALAZINE (APRESOLINE) 25 MG tablet Take 1 tablet (25 mg total) by mouth 3 (three) times daily. 07/23/17   Lendon Colonel, NP  HYDROcodone-acetaminophen (NORCO) 7.5-325 MG tablet Take 1 tablet by mouth every 6 (six) hours as needed for moderate pain (Must last 30 days.). 10/24/17   Sanjuana Kava, MD  isosorbide mononitrate (IMDUR) 60 MG 24 hr tablet Take 1 tablet (60 mg total) by mouth daily. 11/06/17   Croitoru, Mihai, MD  meclizine (ANTIVERT) 25 MG tablet Take 1 tablet (25 mg total) by mouth 3 (three) times daily as needed for dizziness. 05/01/17   Fayrene Helper, MD  metolazone (ZAROXOLYN) 2.5 MG tablet TAKE 1 TABLET ONCE A WEEK 07/23/17   Lendon Colonel, NP  metoprolol succinate (TOPROL XL) 100 MG 24 hr tablet Take 1 tablet (100 mg total) by mouth daily. Take with or immediately following a meal. 11/30/17   Fayrene Helper, MD  ondansetron (ZOFRAN) 4 MG tablet Take 1 tablet (4 mg total) by mouth daily as needed for nausea or vomiting. 05/01/17   Fayrene Helper, MD  Polyethyl  [name] (SYSTANE OP) Place 1 drop into both eyes daily as needed (dry eyes).    [provider]  potassium chloride SA (KLOR-CON M20) 20 MEQ tablet TAKE 1 TABLET DAILY 11/28/17   Fayrene Helper, MD  potassium chloride  SA (KLOR-CON M20) 20 MEQ tablet Take 1 tablet by mouth  daily 11/30/17   Fayrene Helper, MD  pravastatin (PRAVACHOL) 40 MG tablet Take 1 tablet (40 mg total) by mouth every evening. 07/23/17 11/01/18  Lendon Colonel, NP  predniSONE (DELTASONE) 10 MG tablet Take 40mg  po daily for 2 days then 30mg  daily for 2 days then 20mg  daily for 2 days then 10mg  daily for 2 days then stop 11/17/17   Kathie Dike, MD  SYNTHROID 75 MCG tablet Take 1 tablet by mouth  daily 10/18/17   Fayrene Helper, MD    Physical Exam: Vitals:   12/09/17 2215 12/09/17 2230 12/09/17 2232 12/09/17 2300  BP:  (!) 143/59 (!) 143/59   Pulse: 61  88 75  Resp: (!) 30  19   Temp:      TempSrc:      SpO2: 96%  91% 97%    Constitutional: NAD, calm, comfortable Eyes: PERRL, lids and conjunctivae normal ENMT: Mucous membranes are moist. Posterior pharynx clear of any exudate or lesions. Neck: normal, supple, no masses, no thyromegaly Respiratory: clear to auscultation bilaterally, no wheezing, no crackles. Normal respiratory effort. No accessory muscle use.  Cardiovascular: Regular rate and rhythm with occasional extrasystole, positive 1/6 systolic murmur.  Positive prostatic valve click.  No rubs / gallops. No extremity edema. 2+ pedal pulses. No carotid bruits.  Abdomen: Obese, soft, no tenderness, no masses palpated. No hepatosplenomegaly. Bowel sounds positive.  Musculoskeletal: no clubbing / cyanosis. Good ROM, no contractures. Normal muscle tone.  Skin: no rashes, lesions, ulcers on limited dermatological exam. Neurologic: CN 2-12 grossly intact. Sensation intact, DTR normal. Strength 5/5 in all 4.  Psychiatric: Normal judgment and insight. Alert and oriented x 4. Normal  mood.    Labs on Admission: I have personally reviewed following labs and imaging studies  CBC: Recent Labs  Lab 12/09/17 2213  WBC 6.7  NEUTROABS 5.5  HGB 9.0*  HCT 28.9*  MCV 96.3  PLT 937   Basic Metabolic Panel: Recent Labs  Lab 12/09/17 2213  NA 143  K 4.1  CL 104  CO2 27  GLUCOSE 121*  BUN 51*  CREATININE 1.86*  CALCIUM 9.5   GFR: CrCl cannot be calculated (Unknown ideal weight.). Liver Function Tests: No results for input(s): AST, ALT, ALKPHOS, BILITOT, PROT, ALBUMIN in the last 168 hours. No results for input(s): LIPASE, AMYLASE in the last 168 hours. No results for input(s): AMMONIA in the last 168 hours. Coagulation Profile: Recent Labs  Lab 12/03/17 1106  INR 2.9   Cardiac Enzymes: Recent Labs  Lab 12/09/17 2213  TROPONINI 0.07*   BNP (last 3 results) No results for input(s): PROBNP in the last 8760 hours. HbA1C: No results for input(s): HGBA1C in the last 72 hours. CBG: No results for input(s): GLUCAP in the last 168 hours. Lipid Profile: No results for input(s): CHOL, HDL, LDLCALC, TRIG, CHOLHDL, LDLDIRECT in the last 72 hours. Thyroid Function Tests: No results for input(s): TSH, T4TOTAL, FREET4, T3FREE, THYROIDAB in the last 72 hours. Anemia Panel: No results for input(s): VITAMINB12, FOLATE, FERRITIN, TIBC, IRON, RETICCTPCT in the last 72 hours. Urine analysis:    Component Value Date/Time   COLORURINE STRAW (A) 11/15/2017 1147   APPEARANCEUR CLEAR 11/15/2017 1147   LABSPEC 1.006 11/15/2017 1147   PHURINE 5.0 11/15/2017 1147   GLUCOSEU NEGATIVE 11/15/2017 1147   GLUCOSEU NEG mg/dL 03/22/2010 2313   HGBUR NEGATIVE 11/15/2017 1147   HGBUR negative 05/27/2010 1122  BILIRUBINUR NEGATIVE 11/15/2017 1147   BILIRUBINUR neg 03/11/2013 1443   KETONESUR NEGATIVE 11/15/2017 1147   PROTEINUR NEGATIVE 11/15/2017 1147   UROBILINOGEN 0.2 03/11/2013 1443   UROBILINOGEN 0.2 03/27/2012 1201   NITRITE NEGATIVE 11/15/2017 1147   LEUKOCYTESUR  NEGATIVE 11/15/2017 1147    Radiological Exams on Admission: Dg Chest 2 View  Result Date: 12/09/2017 CLINICAL DATA:  Lower extremity edema EXAM: CHEST - 2 VIEW COMPARISON:  November 15, 2017 FINDINGS: There is no edema or consolidation. Heart is mildly enlarged with pulmonary vascularity within normal limits. There is aortic atherosclerosis. There are pacemaker leads attached to the right atrium, middle cardiac vein, and coronary sinus. There are mitral and aortic valve replacements. There is no evident adenopathy. No bone lesions. IMPRESSION: Cardiomegaly with postoperative changes as noted. Aortic atherosclerosis present. No edema or consolidation. Aortic Atherosclerosis (ICD10-I70.0). Electronically Signed   By: Lowella Grip III M.D.   On: 12/09/2017 22:23   06/02/2016 echocardiogram complete ------------------------------------------------------------------- LV EF: 30% -   35%  ------------------------------------------------------------------- Indications:      Aortic Disorder 424.1.  Mitral Valve Disorder 424.0.  ------------------------------------------------------------------- History:   PMH:  Non-ischemic cardiomyopathy with ICD.  PMH: Morbid Obesity.  Risk factors:  Hypertension. Diabetes mellitus. Dyslipidemia.  ------------------------------------------------------------------- Study Conclusions  - Left ventricle: The cavity size was normal. Wall thickness was   increased in a pattern of moderate LVH. Systolic function was   moderately to severely reduced. The estimated ejection fraction   was in the range of 30% to 35%. Diffuse hypokinesis. The study is   not technically sufficient to allow evaluation of LV diastolic   function. - Aortic valve: Normally functioning mechanical aortic valve noted. - Mitral valve: Normally functioning mechanical mitral valve noted. - Left atrium: The atrium was moderately dilated. - Right ventricle: Pacer wire or catheter noted  in right ventricle.   Systolic function was mildly reduced. - Right atrium: The atrium was mildly dilated. Pacer wire or   catheter noted in right atrium. - Tricuspid valve: There was mild regurgitation. - Pulmonic valve: There was mild regurgitation. - Pulmonary arteries: PA peak pressure: 36 mm Hg (S). - Pericardium, extracardiac: A trivial pericardial effusion was   identified.  11/16/2017 echocardiogram complete ------------------------------------------------------------------- LV EF: 60% -   65%  ------------------------------------------------------------------- Indications:      CHF - 428.0.  ------------------------------------------------------------------- History:   PMH:  NICM, s/p mechanical AVR, [employer] #25 MVR. Congestive heart failure.  Aortic valve disease.  Mitral valve disease.  Chronic obstructive pulmonary disease.  Risk factors: Hypertension. Dyslipidemia.  ------------------------------------------------------------------- Study Conclusions  - Left ventricle: The cavity size was normal. Wall thickness was   increased in a pattern of moderate LVH. Systolic function was   normal. The estimated ejection fraction was in the range of 60%   to 65%. The study is not technically sufficient to allow   evaluation of LV diastolic function. - Aortic valve: A [employer] mechanical prosthesis was   present. Mean gradient (S): 14 mm Hg. Peak gradient (S): 30 mm   Hg. VTI ratio of LVOT to aortic valve: 0.31. - Mitral valve: A [employer] mechanical prosthesis was   present. There was trivial regurgitation. Mean gradient (D): 4 mm   Hg. - Left atrium: The atrium was mildly to moderately dilated. - Right ventricle: Pacer wire or catheter noted in right ventricle. - Tricuspid valve: There was trivial regurgitation. - Pulmonary arteries: PA peak pressure: 42 mm Hg (S). - Pericardium, extracardiac: There was  no pericardial effusion.  Impressions:  -  LVEF has improved significantly since prior study from February   2018. Gradients suggest adequate aortic and mitral prosthetic   function.  EKG: Independently reviewed. Vent. rate 92 BPM PR interval * ms QRS duration 197 ms QT/QTc 433/536 ms P-R-T axes 79 141 6 Atrial-sensed ventricular-paced complexes No further analysis attempted due to paced rhythm  Assessment/Plan Principal Problem:   Volume overload Could this be diastolic dysfunction? Most recent echo done last month shows an EF of 60-65%. Will observe for 24-48 hours on telemetry bed. Fluid and sodium restriction. Monitor intake and output. Daily weights. Continue supplemental oxygen. Furosemide 40 mg IVP twice daily. Continue potassium supplementation. Continue digoxin 0.125 mg p.o. daily. Resume metoprolol in a.m., once the patient is feeling better. Follow-up with cardiology as an outpatient as scheduled.  Active Problems:   Hypothyroidism, postsurgical Per patient, she did not have thyroid cancer to her knowledge. Continue levothyroxine 75 mcg p.o. daily. Check TSH level.    Prediabetes Last hemoglobin A1c 5.9% on 06/28/2017. Carbohydrate modified diet. CBG monitoring with regular insulin sliding scale.    Hyperlipidemia LDL goal <70 Continue pravastatin 40 mg p.o. daily. Monitor LFTs periodically and as needed.    Essential hypertension Continue hydralazine 25 mg p.o. 3 times daily. Losartan was held earlier. Continue metoprolol succinate 100 mg p.o. daily.    GERD Protonix 40 mg p.o. daily.    Chronic anticoagulation, on coumadin for Mechanical AVR and MVR INR is 2.95. Check Daily PT/INR. Warfarin per pharmacy.    COPD (chronic obstructive pulmonary disease) (HCC) Continue supplemental oxygen. Bronchodilators as needed.     DVT prophylaxis: On warfarin. Code Status: Full code. Family Communication:  Disposition Plan: Observation admission for 24-48 hours for volume overload  treatment. Consults called:  Admission status: Observation/telemetry.   Reubin Milan MD [facility] Pager [phone].  If 7PM-7AM, please contact night-coverage [email or web address] Password [email or web address]  12/09/2017, 11:44 PM

## 2017-12-10 ENCOUNTER — Observation Stay (HOSPITAL_COMMUNITY): Payer: Medicare Other

## 2017-12-10 ENCOUNTER — Encounter (HOSPITAL_COMMUNITY): Payer: Self-pay | Admitting: *Deleted

## 2017-12-10 ENCOUNTER — Other Ambulatory Visit: Payer: Self-pay

## 2017-12-10 DIAGNOSIS — E785 Hyperlipidemia, unspecified: Secondary | ICD-10-CM

## 2017-12-10 DIAGNOSIS — R7303 Prediabetes: Secondary | ICD-10-CM | POA: Diagnosis not present

## 2017-12-10 DIAGNOSIS — E1122 Type 2 diabetes mellitus with diabetic chronic kidney disease: Secondary | ICD-10-CM | POA: Diagnosis present

## 2017-12-10 DIAGNOSIS — N181 Chronic kidney disease, stage 1: Secondary | ICD-10-CM | POA: Diagnosis present

## 2017-12-10 DIAGNOSIS — E890 Postprocedural hypothyroidism: Secondary | ICD-10-CM | POA: Diagnosis not present

## 2017-12-10 DIAGNOSIS — M109 Gout, unspecified: Secondary | ICD-10-CM | POA: Diagnosis present

## 2017-12-10 DIAGNOSIS — K219 Gastro-esophageal reflux disease without esophagitis: Secondary | ICD-10-CM

## 2017-12-10 DIAGNOSIS — M4310 Spondylolisthesis, site unspecified: Secondary | ICD-10-CM | POA: Diagnosis present

## 2017-12-10 DIAGNOSIS — I10 Essential (primary) hypertension: Secondary | ICD-10-CM | POA: Diagnosis not present

## 2017-12-10 DIAGNOSIS — I429 Cardiomyopathy, unspecified: Secondary | ICD-10-CM | POA: Diagnosis present

## 2017-12-10 DIAGNOSIS — I481 Persistent atrial fibrillation: Secondary | ICD-10-CM | POA: Diagnosis present

## 2017-12-10 DIAGNOSIS — Z7901 Long term (current) use of anticoagulants: Secondary | ICD-10-CM | POA: Diagnosis not present

## 2017-12-10 DIAGNOSIS — I442 Atrioventricular block, complete: Secondary | ICD-10-CM | POA: Diagnosis present

## 2017-12-10 DIAGNOSIS — E8770 Fluid overload, unspecified: Secondary | ICD-10-CM | POA: Diagnosis not present

## 2017-12-10 DIAGNOSIS — J410 Simple chronic bronchitis: Secondary | ICD-10-CM | POA: Diagnosis not present

## 2017-12-10 DIAGNOSIS — R42 Dizziness and giddiness: Secondary | ICD-10-CM | POA: Diagnosis present

## 2017-12-10 DIAGNOSIS — I5043 Acute on chronic combined systolic (congestive) and diastolic (congestive) heart failure: Secondary | ICD-10-CM | POA: Diagnosis present

## 2017-12-10 DIAGNOSIS — K5900 Constipation, unspecified: Secondary | ICD-10-CM | POA: Diagnosis present

## 2017-12-10 DIAGNOSIS — R748 Abnormal levels of other serum enzymes: Secondary | ICD-10-CM | POA: Diagnosis not present

## 2017-12-10 DIAGNOSIS — R791 Abnormal coagulation profile: Secondary | ICD-10-CM | POA: Diagnosis present

## 2017-12-10 DIAGNOSIS — J449 Chronic obstructive pulmonary disease, unspecified: Secondary | ICD-10-CM | POA: Diagnosis present

## 2017-12-10 DIAGNOSIS — R0602 Shortness of breath: Secondary | ICD-10-CM | POA: Diagnosis not present

## 2017-12-10 DIAGNOSIS — I080 Rheumatic disorders of both mitral and aortic valves: Secondary | ICD-10-CM | POA: Diagnosis present

## 2017-12-10 DIAGNOSIS — M479 Spondylosis, unspecified: Secondary | ICD-10-CM | POA: Diagnosis present

## 2017-12-10 DIAGNOSIS — M47816 Spondylosis without myelopathy or radiculopathy, lumbar region: Secondary | ICD-10-CM | POA: Diagnosis present

## 2017-12-10 DIAGNOSIS — D631 Anemia in chronic kidney disease: Secondary | ICD-10-CM | POA: Diagnosis present

## 2017-12-10 DIAGNOSIS — Z952 Presence of prosthetic heart valve: Secondary | ICD-10-CM | POA: Diagnosis not present

## 2017-12-10 DIAGNOSIS — I130 Hypertensive heart and chronic kidney disease with heart failure and stage 1 through stage 4 chronic kidney disease, or unspecified chronic kidney disease: Secondary | ICD-10-CM | POA: Diagnosis present

## 2017-12-10 DIAGNOSIS — H409 Unspecified glaucoma: Secondary | ICD-10-CM | POA: Diagnosis present

## 2017-12-10 LAB — GLUCOSE, CAPILLARY
Glucose-Capillary: 98 mg/dL (ref 65–99)
Glucose-Capillary: 98 mg/dL (ref 65–99)
Glucose-Capillary: 93 mg/dL (ref 65–99)

## 2017-12-10 LAB — TSH: TSH: 0.817 u[IU]/mL (ref 0.350–4.500)

## 2017-12-10 LAB — DIGOXIN LEVEL: Digoxin Level: 0.5 ng/mL — ABNORMAL LOW (ref 0.8–2.0)

## 2017-12-10 LAB — BRAIN NATRIURETIC PEPTIDE: B Natriuretic Peptide: 306 pg/mL — ABNORMAL HIGH (ref 0.0–100.0)

## 2017-12-10 LAB — TROPONIN I
Troponin I: 0.07 ng/mL (ref <0.03)
Troponin I: 0.07 ng/mL (ref <0.03)

## 2017-12-10 MED ORDER — HYDROCODONE-ACETAMINOPHEN 7.5-325 MG PO TABS: 1.0000 | ORAL_TABLET | Freq: Four times a day (QID) | ORAL | Status: DC | PRN

## 2017-12-10 MED ORDER — FOLIC ACID 1 MG PO TABS
1.0000 mg | ORAL_TABLET | Freq: Every day | ORAL | Status: DC
  Administered 2017-12-10: 1 mg via ORAL
  Filled 2017-12-10 (×2): qty 1

## 2017-12-10 MED ORDER — ALLOPURINOL 100 MG PO TABS
100.0000 mg | ORAL_TABLET | Freq: Every day | ORAL | Status: DC
  Administered 2017-12-10 – 2017-12-11 (×2): 100 mg via ORAL
  Filled 2017-12-10 (×2): qty 1

## 2017-12-10 MED ORDER — POLYVINYL ALCOHOL 1.4 % OP SOLN
Freq: Every day | OPHTHALMIC | Status: DC | PRN
Start: 2017-12-10 — End: 2017-12-11

## 2017-12-10 MED ORDER — MECLIZINE HCL 12.5 MG PO TABS: 25.0000 mg | ORAL_TABLET | Freq: Three times a day (TID) | ORAL | Status: DC | PRN

## 2017-12-10 MED ORDER — DIGOXIN 125 MCG PO TABS
0.1250 mg | ORAL_TABLET | ORAL | Status: DC
  Administered 2017-12-10: 0.125 mg via ORAL
  Filled 2017-12-10: qty 1

## 2017-12-10 MED ORDER — ORAL CARE MOUTH RINSE
15.0000 mL | Freq: Two times a day (BID) | OROMUCOSAL | Status: DC
  Administered 2017-12-10 (×2): 15 mL via OROMUCOSAL

## 2017-12-10 MED ORDER — VITAMIN D 1000 UNITS PO TABS
1000.0000 [IU] | ORAL_TABLET | Freq: Every day | ORAL | Status: DC
  Administered 2017-12-10 – 2017-12-11 (×2): 1000 [IU] via ORAL
  Filled 2017-12-10 (×2): qty 1

## 2017-12-10 MED ORDER — WARFARIN SODIUM 5 MG PO TABS
5.0000 mg | ORAL_TABLET | Freq: Once | ORAL | Status: AC
  Administered 2017-12-10: 5 mg via ORAL
  Filled 2017-12-10: qty 1

## 2017-12-10 MED ORDER — METOLAZONE 2.5 MG PO TABS
2.5000 mg | ORAL_TABLET | ORAL | Status: DC
  Filled 2017-12-10: qty 1

## 2017-12-10 MED ORDER — ACETAMINOPHEN 650 MG RE SUPP: 650.0000 mg | Freq: Four times a day (QID) | RECTAL | Status: DC | PRN

## 2017-12-10 MED ORDER — ISOSORBIDE MONONITRATE ER 60 MG PO TB24
60.0000 mg | ORAL_TABLET | Freq: Every day | ORAL | Status: DC
  Administered 2017-12-10 – 2017-12-11 (×2): 60 mg via ORAL
  Filled 2017-12-10 (×2): qty 1

## 2017-12-10 MED ORDER — PRAVASTATIN SODIUM 40 MG PO TABS
40.0000 mg | ORAL_TABLET | Freq: Every evening | ORAL | Status: DC
  Administered 2017-12-10 – 2017-12-11 (×2): 40 mg via ORAL
  Filled 2017-12-10 (×2): qty 1

## 2017-12-10 MED ORDER — ONDANSETRON HCL 4 MG PO TABS
4.0000 mg | ORAL_TABLET | Freq: Four times a day (QID) | ORAL | Status: DC | PRN
  Administered 2017-12-11: 4 mg via ORAL
  Filled 2017-12-10: qty 1

## 2017-12-10 MED ORDER — DOCUSATE SODIUM 100 MG PO CAPS
300.0000 mg | ORAL_CAPSULE | Freq: Every day | ORAL | Status: DC
  Administered 2017-12-10: 300 mg via ORAL
  Filled 2017-12-10: qty 3

## 2017-12-10 MED ORDER — ONDANSETRON HCL 4 MG/2ML IJ SOLN
4.0000 mg | Freq: Four times a day (QID) | INTRAMUSCULAR | Status: DC | PRN
  Administered 2017-12-10 (×2): 4 mg via INTRAVENOUS
  Filled 2017-12-10 (×2): qty 2

## 2017-12-10 MED ORDER — ALBUTEROL SULFATE (2.5 MG/3ML) 0.083% IN NEBU: 2.5000 mg | INHALATION_SOLUTION | Freq: Four times a day (QID) | RESPIRATORY_TRACT | Status: DC | PRN

## 2017-12-10 MED ORDER — GUAIFENESIN 100 MG/5ML PO SOLN
5.0000 mL | ORAL | Status: DC | PRN
  Administered 2017-12-10: 100 mg via ORAL
  Filled 2017-12-10: qty 5

## 2017-12-10 MED ORDER — LEVOTHYROXINE SODIUM 75 MCG PO TABS
75.0000 ug | ORAL_TABLET | Freq: Every day | ORAL | Status: DC
  Administered 2017-12-10 – 2017-12-11 (×2): 75 ug via ORAL
  Filled 2017-12-10: qty 1
  Filled 2017-12-10: qty 2
  Filled 2017-12-10: qty 1

## 2017-12-10 MED ORDER — WARFARIN - PHARMACIST DOSING INPATIENT
Freq: Every day | Status: DC
  Administered 2017-12-10: 17:00:00

## 2017-12-10 MED ORDER — HYDRALAZINE HCL 25 MG PO TABS
25.0000 mg | ORAL_TABLET | Freq: Three times a day (TID) | ORAL | Status: DC
  Administered 2017-12-10 – 2017-12-11 (×5): 25 mg via ORAL
  Filled 2017-12-10 (×5): qty 1

## 2017-12-10 MED ORDER — ACETAMINOPHEN 500 MG PO TABS: 500.0000 mg | ORAL_TABLET | Freq: Four times a day (QID) | ORAL | Status: DC | PRN

## 2017-12-10 MED ORDER — FLUTICASONE FUROATE-VILANTEROL 100-25 MCG/INH IN AEPB
1.0000 | INHALATION_SPRAY | Freq: Every day | RESPIRATORY_TRACT | Status: DC
  Administered 2017-12-11: 1 via RESPIRATORY_TRACT
  Filled 2017-12-10: qty 28

## 2017-12-10 MED ORDER — TECHNETIUM TC 99M DIETHYLENETRIAME-PENTAACETIC ACID
30.0000 | Freq: Once | INTRAVENOUS | Status: AC | PRN
  Administered 2017-12-10: 29.8 via RESPIRATORY_TRACT

## 2017-12-10 MED ORDER — GABAPENTIN 300 MG PO CAPS
300.0000 mg | ORAL_CAPSULE | Freq: Every day | ORAL | Status: DC
  Administered 2017-12-10: 300 mg via ORAL
  Filled 2017-12-10: qty 1

## 2017-12-10 MED ORDER — TECHNETIUM TO 99M ALBUMIN AGGREGATED
4.0000 | Freq: Once | INTRAVENOUS | Status: AC | PRN
  Administered 2017-12-10: 4.4 via INTRAVENOUS

## 2017-12-10 MED ORDER — ACETAMINOPHEN 325 MG PO TABS: 650.0000 mg | ORAL_TABLET | Freq: Four times a day (QID) | ORAL | Status: DC | PRN

## 2017-12-10 MED ORDER — MAGNESIUM SULFATE 2 GM/50ML IV SOLN
2.0000 g | Freq: Once | INTRAVENOUS | Status: AC
  Administered 2017-12-10: 2 g via INTRAVENOUS
  Filled 2017-12-10: qty 50

## 2017-12-10 MED ORDER — GUAIFENESIN ER 600 MG PO TB12
600.0000 mg | ORAL_TABLET | Freq: Two times a day (BID) | ORAL | Status: DC
  Administered 2017-12-10 – 2017-12-11 (×2): 600 mg via ORAL
  Filled 2017-12-10 (×3): qty 1

## 2017-12-10 MED ORDER — FLUTICASONE PROPIONATE 50 MCG/ACT NA SUSP
2.0000 | Freq: Every day | NASAL | Status: DC
  Administered 2017-12-10 – 2017-12-11 (×2): 2 via NASAL
  Filled 2017-12-10: qty 16

## 2017-12-10 MED ORDER — METOPROLOL SUCCINATE ER 50 MG PO TB24
100.0000 mg | ORAL_TABLET | Freq: Every day | ORAL | Status: DC
  Administered 2017-12-10 – 2017-12-11 (×2): 100 mg via ORAL
  Filled 2017-12-10 (×2): qty 2

## 2017-12-10 MED ORDER — FUROSEMIDE 10 MG/ML IJ SOLN
40.0000 mg | Freq: Two times a day (BID) | INTRAMUSCULAR | Status: DC
  Administered 2017-12-10 – 2017-12-11 (×3): 40 mg via INTRAVENOUS
  Filled 2017-12-10 (×3): qty 4

## 2017-12-10 MED ORDER — POTASSIUM CHLORIDE CRYS ER 20 MEQ PO TBCR
20.0000 meq | EXTENDED_RELEASE_TABLET | Freq: Every day | ORAL | Status: DC
  Administered 2017-12-10 – 2017-12-11 (×2): 20 meq via ORAL
  Filled 2017-12-10 (×2): qty 1

## 2017-12-10 NOTE — Telephone Encounter (Signed)
x

## 2017-12-10 NOTE — Care Management Note (Signed)
Case Management Note  Patient Details  Name: Makela Niehoff MRN: [id number] Date of Birth: 1931-12-21  Subjective/Objective:     Admitted with CHF. From home, ind pta. Has PCP, transportation and insurance with drug coverage. Pt is on [facility] registy but not active, she has had 3 admits in last 6 months. She is active with [facility]. She is on oxygen acutely.                Action/Plan: DC home with resumption of [place] services. Pt will need home O2 assessment once fully diuresed. Pt will get referral for [facility] CM services due to her frequent admissions. CM will cont to follow.   Expected Discharge Date:      12/11/17            Expected Discharge Plan:  [place]  In-House Referral:  NA  Discharge planning Services  CM Consult  Post Acute Care Choice:  Home Health, Resumption of Svcs/PTA Provider Choice offered to:  Patient  HH Arranged:    [place] Agency:  [place]  Status of Service:  In process, will continue to follow  If discussed at Long Length of Stay Meetings, dates discussed:    Additional Comments:  Sherald Barge, RN 12/10/2017, 2:45 PM

## 2017-12-10 NOTE — Progress Notes (Signed)
ANTICOAGULATION CONSULT NOTE -  Pharmacy Consult for warfarin Indication: mechanical valves- aortic and mitral  Allergies  Allergen Reactions  . Penicillins Other (See Comments)    Bruise Has patient had a PCN reaction causing immediate rash, facial/tongue/throat swelling, SOB or lightheadedness with hypotension: no Has patient had a PCN reaction causing severe rash involving mucus membranes or skin necrosis: no Has patient had a PCN reaction that required hospitalization: no Has patient had a PCN reaction occurring within the last 10 years: no If all of the above answers are "NO", then may proceed with Cephalosporin use.   . Aspirin Other (See Comments)    On coumadin  . Fentanyl Dermatitis    Rash with patch   . Niacin Itching    Patient Measurements: Height: 5\' 7"  (170.2 cm) Weight: 213 lb 6.4 oz (96.8 kg) IBW/kg (Calculated) : 61.6   Vital Signs: Temp: 98.1 F (36.7 C) (03/18 0855) Temp Source: Oral (03/17 2114) BP: 157/55 (03/18 0855) Pulse Rate: 80 (03/18 0855)  Labs: Recent Labs    12/09/17 2213 12/10/17 0628  HGB 9.0*  --   HCT 28.9*  --   PLT 163  --   LABPROT 30.5*  --   INR 2.95  --   CREATININE 1.86*  --   TROPONINI 0.07* 0.07*    Estimated Creatinine Clearance: 27.4 mL/min (A) (by C-G formula based on SCr of 1.86 mg/dL (H)).   Medical History: Past Medical History:  Diagnosis Date  . Adenomatous polyp of colon 12/16/2002   Dr. [name]/[facility]  . Allergy   . Cataract   . CHB (complete heart block) ([place]) 10/07/2014      . CHF (congestive heart failure) ([place])       . Chronic kidney disease, stage I 2006  . Constipation       . COPD (chronic obstructive pulmonary disease) ([place])       . DJD (degenerative joint disease) of lumbar spine   . DM (diabetes mellitus) ([place]) 2006  . Esophageal reflux   . Glaucoma   . Gout       . Heart murmur   . Hyperlipemia   . IDA (iron deficiency anemia)    Parenteral  iron/[name]  . Insomnia       . Non-ischemic cardiomyopathy ([place])    a. s/p MDT CRTD  . Obesity, unspecified   . Other and unspecified hyperlipidemia   . Oxygen deficiency 2014   nocturnal;  . Spondylolisthesis   . Spondylosis   . Thyroid cancer ([place])    remote thyroidectomy, no recurrence, pt denies in 2016 thyroid cancer  . Unspecified hypothyroidism       . 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 Vertigo         Medications:  Medications Prior to Admission  Medication Sig Dispense Refill Last Dose  . acetaminophen (TYLENOL) 500 MG tablet Take 500 mg by mouth every 6 (six) hours as needed (pain).     Marland Kitchen albuterol (PROVENTIL) (2.5 MG/3ML) 0.083% nebulizer solution INHALE 1 VIAL VIA NEBULIZER THREE TIMES DAILY. (Patient taking differently: INHALE 1 VIAL VIA NEBULIZER THREE TIMES DAILY AS NEEDED) 90 vial 5   . albuterol (VENTOLIN HFA) 108 (90 Base) MCG/ACT inhaler Inhale 2 puffs into the lungs every 6 (six) hours as needed for wheezing or shortness of breath. 3 Inhaler 3   . allopurinol (ZYLOPRIM) 100 MG tablet Take 1 tablet (100 mg total) by mouth daily.  90 tablet 1   . azelastine (ASTELIN) 0.1 % nasal spray Place 2 sprays into both nostrils 2 (two) times daily. Use in each nostril as directed (Patient taking differently: Place 2 sprays into both nostrils daily as needed for allergies. Use in each nostril as directed) 30 mL 12 unknown  . BREO ELLIPTA 100-25 MCG/INH AEPB Inhale 1 puff into the lungs daily.   1 11/15/2017 at Unknown time  . calcium carbonate (OS-CAL) 1250 (500 Ca) MG chewable tablet Chew 1 tablet by mouth daily.   11/14/2017 at Unknown time  . cholecalciferol (VITAMIN D) 1000 UNITS tablet Take 1,000 Units by mouth daily.   11/14/2017 at Unknown time  . COUMADIN 5 MG tablet Take 2 tablets daily except 1 tablet on Tuesdays (Patient taking differently: Take 1 tablet Tuesday, Thursday, and Saturday and  Take 2 tablets all other days.) 60 tablet 6  11/14/2017 at 2030  . Darbepoetin Alfa (ARANESP, ALBUMIN FREE, IJ) EVERY THURSDAY   Past Week at Unknown time  . digoxin (LANOXIN) 0.125 MG tablet Take 1 tablet (0.125 mg total) by mouth every other day. 45 tablet 3 11/14/2017 at Unknown time  . docusate sodium (COLACE) 100 MG capsule Take 300 mg by mouth at bedtime.   11/14/2017 at Unknown time  . fluticasone (FLONASE) 50 MCG/ACT nasal spray Place 2 sprays into both nostrils daily. Pt needs now 48 g 1   . folic acid (FOLVITE) 1 MG tablet Take 1 tablet (1 mg total) by mouth daily. 100 tablet 3 11/14/2017 at Unknown time  . furosemide (LASIX) 40 MG tablet Take 1 tablet by mouth  daily 90 tablet 1 11/15/2017 at Unknown time  . gabapentin (NEURONTIN) 300 MG capsule Take 1 capsule (300 mg total) by mouth at bedtime. 90 capsule 1   . guaiFENesin (MUCINEX) 600 MG 12 hr tablet Take 1 tablet (600 mg total) by mouth 2 (two) times daily. 60 tablet 2   . hydrALAZINE (APRESOLINE) 25 MG tablet Take 1 tablet (25 mg total) by mouth 3 (three) times daily. 270 tablet 3 11/14/2017 at Unknown time  . HYDROcodone-acetaminophen (NORCO) 7.5-325 MG tablet Take 1 tablet by mouth every 6 (six) hours as needed for moderate pain (Must last 30 days.). 70 tablet 0 11/14/2017 at Unknown time  . isosorbide mononitrate (IMDUR) 60 MG 24 hr tablet Take 1 tablet (60 mg total) by mouth daily. 90 tablet 1 11/14/2017 at Unknown time  . meclizine (ANTIVERT) 25 MG tablet Take 1 tablet (25 mg total) by mouth 3 (three) times daily as needed for dizziness. 30 tablet 0 unknown  . metolazone (ZAROXOLYN) 2.5 MG tablet TAKE 1 TABLET ONCE A WEEK 12 tablet 2 11/14/2017 at Unknown time  . metoprolol succinate (TOPROL XL) 100 MG 24 hr tablet Take 1 tablet (100 mg total) by mouth daily. Take with or immediately following a meal. 90 tablet 1   . ondansetron (ZOFRAN) 4 MG tablet Take 1 tablet (4 mg total) by mouth daily as needed for nausea or vomiting. 90 tablet 0 Past Week at Unknown time  . Polyethyl  [name] (SYSTANE OP) Place 1 drop into both eyes daily as needed (dry eyes).   11/14/2017 at Unknown time  . potassium chloride SA (KLOR-CON M20) 20 MEQ tablet TAKE 1 TABLET DAILY 90 tablet 1   . potassium chloride SA (KLOR-CON M20) 20 MEQ tablet Take 1 tablet by mouth  daily 90 tablet 1   . pravastatin (PRAVACHOL) 40 MG tablet Take 1 tablet (40 mg  total) by mouth every evening. 90 tablet 3 11/14/2017 at Unknown time  . predniSONE (DELTASONE) 10 MG tablet Take 40mg  po daily for 2 days then 30mg  daily for 2 days then 20mg  daily for 2 days then 10mg  daily for 2 days then stop 20 tablet 0   . SYNTHROID 75 MCG tablet Take 1 tablet by mouth  daily 90 tablet 1 11/15/2017 at Unknown time    Assessment: Pharmacy consulted to dose warfarin for patient with mechanical aortic and mitral valves. Patient has INR goal of 2.0-2.5 per Dr Tanna Furry orders in August. Current regimen is 5 mg every Tue,Thu,Sat and 10 mg ROW. INR on admission is 2.95 taken last night at 2200. Will give reduced dose today since adjusted INR for morning would still be slightly elevated.  Goal of Therapy:  INR 2.0-2.5 Monitor platelets by anticoagulation protocol: Yes   Plan:  Warfarin 5 mg x 1 dose INR daily Monitor for s/s of bleeding  Ramond Craver 12/10/2017,9:07 AM

## 2017-12-10 NOTE — Progress Notes (Signed)
Dr. Wynetta Emery made aware via text page of troponin of 0.07

## 2017-12-10 NOTE — Care Management Obs Status (Signed)
[place] NOTIFICATION   Patient Details  Name: [name] [name] MRN: [id number] Date of Birth: 1932/07/05   Medicare Observation Status Notification Given:  Yes    Sherald Barge, RN 12/10/2017, 11:27 AM

## 2017-12-10 NOTE — Progress Notes (Signed)
PROGRESS NOTE    [name] [name]  WVP:[id number]  DOB: 1931-12-10  DOA: 12/09/2017 PCP: Fayrene Helper, MD   Brief Admission Hx: [name] [name] is a 84 y.o. female with medical history significant of history of complete heart block (history of pacemaker and biventricular ICD), history of systolic CHF with greatly improved EF in the past 18 months (see attached results below from the two most recent echocardiograms), persistent atrial fibrillation last year, which has now resolved, stage I chronic kidney disease, constipation, colon polyps, allergies, cataracts, glaucoma, COPD, DJD of the lumbar spine spondylolisthesis/spondylosis, glucose intolerance/type 2 diabetes, GERD, gout, hyperlipidemia, iron deficiency anemia, insomnia, obesity, history of thyroid cancer, thyroidectomy, postsurgical hypothyroidism, vertigo who is coming to the emergency department with complaints of dyspnea and lower extremity edema for the last few days.  MDM/Assessment & Plan:   1. Acute volume overload / acute diastolic CHF exacerbation - admit for IV diuresis with lasix.  Monitor weights, I/Os, electrolytes.  Follow clinically.  Pt is hopeful to discharge home tomorrow after diuresis.  2. Hypothyroidism, postsurgical - TSH level WNL. 3. Essential HTN - stable blood pressures, holding losartan for now.  4. GERD - continue protonix for GI protection.  5. COPD - stable, bronchodilators as needed.  6. HLD - continue home pravastatin.  7. Elevated D dimer - VQ scan pending.    DVT prophylaxis: warfarin  Code Status: full  Family Communication:  Disposition Plan: pt hopeful to discharge tomorrow morning after diuresis  Subjective: Pt without complaints, she is starting to diurese now.    Objective: Vitals:   12/10/17 0601 12/10/17 0700 12/10/17 0800 12/10/17 0855  BP: 116/66 140/61 137/72 (!) 157/55  Pulse: 96 80 71 80  Resp: 18   18  Temp:    98.1 F (36.7 C)  TempSrc:      SpO2: 97% 95% 97% 100%    Weight:    96.8 kg (213 lb 6.4 oz)  Height:    5\' 7"  (1.702 m)    Intake/Output Summary (Last 24 hours) at 12/10/2017 1211 Last data filed at 12/10/2017 0202 Gross per 24 hour  Intake 50 ml  Output -  Net 50 ml   Filed Weights   12/10/17 0855  Weight: 96.8 kg (213 lb 6.4 oz)     REVIEW OF SYSTEMS  As per history otherwise all reviewed and reported negative  Exam:  General exam: awake, alert, NAD, cooperative.  Respiratory system: bibasilar crackles heard.  No increased work of breathing. Cardiovascular system: S1 & S2 heard, RRR. No JVD, gallops, clicks or pedal edema. Systolic murmur heard.   Gastrointestinal system: Abdomen is nondistended, soft and nontender. Normal bowel sounds heard. Central nervous system: Alert and oriented. No focal neurological deficits. Extremities: 2+ edema BLEs.  Data Reviewed: Basic Metabolic Panel: Recent Labs  Lab 12/09/17 2213  NA 143  K 4.1  CL 104  CO2 27  GLUCOSE 121*  BUN 51*  CREATININE 1.86*  CALCIUM 9.5  MG 1.7   Liver Function Tests: No results for input(s): AST, ALT, ALKPHOS, BILITOT, PROT, ALBUMIN in the last 168 hours. No results for input(s): LIPASE, AMYLASE in the last 168 hours. No results for input(s): AMMONIA in the last 168 hours. CBC: Recent Labs  Lab 12/09/17 2213  WBC 6.7  NEUTROABS 5.5  HGB 9.0*  HCT 28.9*  MCV 96.3  PLT 163   Cardiac Enzymes: Recent Labs  Lab 12/09/17 2213 12/10/17 0628  TROPONINI 0.07* 0.07*   CBG (last 3)  No results for input(s): GLUCAP in the last 72 hours. No results found for this or any previous visit (from the past 240 hour(s)).   Studies: Dg Chest 2 View  Result Date: 12/09/2017 CLINICAL DATA:  Lower extremity edema EXAM: CHEST - 2 VIEW COMPARISON:  November 15, 2017 FINDINGS: There is no edema or consolidation. Heart is mildly enlarged with pulmonary vascularity within normal limits. There is aortic atherosclerosis. There are pacemaker leads attached to the right  atrium, middle cardiac vein, and coronary sinus. There are mitral and aortic valve replacements. There is no evident adenopathy. No bone lesions. IMPRESSION: Cardiomegaly with postoperative changes as noted. Aortic atherosclerosis present. No edema or consolidation. Aortic Atherosclerosis (ICD10-I70.0). Electronically Signed   By: Lowella Grip III M.D.   On: 12/09/2017 22:23     Scheduled Meds: . allopurinol  100 mg Oral Daily  . cholecalciferol  1,000 Units Oral Daily  . digoxin  0.125 mg Oral QODAY  . docusate sodium  300 mg Oral QHS  . fluticasone  2 spray Each Nare Daily  . fluticasone furoate-vilanterol  1 puff Inhalation Daily  . folic acid  1 mg Oral Daily  . furosemide  40 mg Intravenous BID  . gabapentin  300 mg Oral QHS  . guaiFENesin  600 mg Oral BID  . hydrALAZINE  25 mg Oral TID  . isosorbide mononitrate  60 mg Oral Daily  . levothyroxine  75 mcg Oral QAC breakfast  . mouth rinse  15 mL Mouth Rinse BID  . metolazone  2.5 mg Oral Weekly  . metoprolol succinate  100 mg Oral Daily  . potassium chloride SA  20 mEq Oral Daily  . pravastatin  40 mg Oral QPM  . warfarin  5 mg Oral ONCE-1800  . Warfarin - Pharmacist Dosing Inpatient   Does not apply q1800   Continuous Infusions:  Principal Problem:   Volume overload Active Problems:   Hypothyroidism, postsurgical   Prediabetes   Hyperlipidemia LDL goal <70   Essential hypertension   GERD   Chronic anticoagulation, on coumadin for Mechanical AVR and MVR   COPD (chronic obstructive pulmonary disease) ([place])  Time spent:   Irwin Brakeman, MD, FAAFP [facility] Pager [phone]  If 7PM-7AM, please contact night-coverage [email or web address] Password [email or web address] 12/10/2017, 12:11 PM    LOS: 0 days

## 2017-12-11 DIAGNOSIS — I10 Essential (primary) hypertension: Secondary | ICD-10-CM

## 2017-12-11 LAB — BASIC METABOLIC PANEL
Anion gap: 11 (ref 5–15)
BUN: 44 mg/dL — ABNORMAL HIGH (ref 6–20)
CO2: 29 mmol/L (ref 22–32)
Calcium: 9.3 mg/dL (ref 8.9–10.3)
Chloride: 99 mmol/L — ABNORMAL LOW (ref 101–111)
Creatinine, Ser: 1.89 mg/dL — ABNORMAL HIGH (ref 0.44–1.00)
GFR calc Af Amer: 27 mL/min — ABNORMAL LOW (ref >60)
GFR calc non Af Amer: 23 mL/min — ABNORMAL LOW (ref >60)
Glucose, Bld: 106 mg/dL — ABNORMAL HIGH (ref 65–99)
Potassium: 4.2 mmol/L (ref 3.5–5.1)
Sodium: 139 mmol/L (ref 135–145)

## 2017-12-11 LAB — CBC
HCT: 30.3 % — ABNORMAL LOW (ref 36.0–46.0)
Hemoglobin: 9.3 g/dL — ABNORMAL LOW (ref 12.0–15.0)
MCH: 29.7 pg (ref 26.0–34.0)
MCHC: 30.7 g/dL (ref 30.0–36.0)
MCV: 96.8 fL (ref 78.0–100.0)
Platelets: 156 10*3/uL (ref 150–400)
RBC: 3.13 MIL/uL — ABNORMAL LOW (ref 3.87–5.11)
RDW: 16.5 % — ABNORMAL HIGH (ref 11.5–15.5)
WBC: 6 10*3/uL (ref 4.0–10.5)

## 2017-12-11 LAB — GLUCOSE, CAPILLARY
Glucose-Capillary: 98 mg/dL (ref 65–99)
Glucose-Capillary: 113 mg/dL — ABNORMAL HIGH (ref 65–99)

## 2017-12-11 LAB — PROTIME-INR
INR: 2
Prothrombin Time: 22.5 seconds — ABNORMAL HIGH (ref 11.4–15.2)

## 2017-12-11 MED ORDER — METOPROLOL SUCCINATE ER 100 MG PO TB24: 100.0000 mg | ORAL_TABLET | Freq: Every day | ORAL | 0 refills | Status: DC

## 2017-12-11 MED ORDER — AZELASTINE HCL 0.1 % NA SOLN: 2.0000 | Freq: Every day | NASAL | Status: DC | PRN

## 2017-12-11 NOTE — Progress Notes (Signed)
SATURATION QUALIFICATIONS: (This note is used to comply with regulatory documentation for home oxygen)  Patient Saturations on Room Air at Rest = 97%  Patient Saturations on Room Air while Ambulating = 87%  Patient Saturations on 2 Liters of oxygen while Ambulating 96%

## 2017-12-11 NOTE — Discharge Instructions (Signed)
Follow with Primary MD  Fayrene Helper, MD  and other consultants as instructed your Hospitalist MD  Please get a complete blood count and chemistry panel checked by your Primary MD at your next visit, and again as instructed by your Primary MD.  Get Medicines reviewed and adjusted: Please take all your medications with you for your next visit with your Primary MD  Laboratory/radiological data: Please request your Primary MD to go over all hospital tests and procedure/radiological results at the follow up, please ask your Primary MD to get all Hospital records sent to his/her office.  In some cases, they will be blood work, cultures and biopsy results pending at the time of your discharge. Please request that your primary care M.D. follows up on these results.     Also Note the following: If you experience worsening of your admission symptoms, develop shortness of breath, life threatening emergency, suicidal or homicidal thoughts you must seek medical attention immediately by calling 911 or calling your MD immediately  if symptoms less severe.  You must read complete instructions/literature along with all the possible adverse reactions/side effects for all the Medicines you take and that have been prescribed to you. Take any new Medicines after you have completely understood and accpet all the possible adverse reactions/side effects.   Do not drive when taking Pain medications or sleeping medications (Benzodaizepines)  Do not take more than prescribed Pain, Sleep and Anxiety Medications. It is not advisable to combine anxiety,sleep and pain medications without talking with your primary care practitioner  Special Instructions: If you have smoked or chewed Tobacco  in the last 2 yrs please stop smoking, stop any regular Alcohol  and or any Recreational drug use.  Wear Seat belts while driving.  Please note: You were cared for by a hospitalist during your hospital stay. Once you are  discharged, your primary care physician will handle any further medical issues. Please note that NO REFILLS for any discharge medications will be authorized once you are discharged, as it is imperative that you return to your primary care physician (or establish a relationship with a primary care physician if you do not have one) for your post hospital discharge needs so that they can reassess your need for medications and monitor your lab values.    Heart Failure Eating Plan Heart failure, also called congestive heart failure, occurs when your heart does not pump blood well enough to meet your body's needs for oxygen-rich blood. Heart failure is a long-term (chronic) condition. Living with heart failure can be challenging. However, following your health care provider's instructions about a healthy lifestyle and working with a diet and nutrition specialist (dietitian) to choose the right foods may help to improve your symptoms. What are tips for following this plan? General guidelines  Do not eat more than 2,300 mg of salt (sodium) a day. The amount of sodium that is recommended for you may be lower, depending on your condition.  Maintain a healthy body weight as directed. Ask your health care provider what a healthy weight is for you. ? Check your weight every day. ? Work with your health care provider and dietitian to make a plan that is right for you to lose weight or maintain your current weight.  Limit how much fluid you drink. Ask your health care provider or dietitian how much fluid you can have each day.  Limit or avoid alcohol as told by your health care provider or dietitian. Reading food labels  Check food labels for the amount of sodium per serving. Choose foods that have less than 140 mg (milligrams) of sodium in each serving.  Check food labels for the number of calories per serving. This is important if you need to limit your daily calorie intake to lose weight.  Check food  labels for the serving size. If you eat more than one serving, you will be eating more sodium and calories than what is listed on the label.  Look for foods that are labeled as "sodium-free," "very low sodium," or "low sodium." ? Foods labeled as "reduced sodium" or "lightly salted" may still have more sodium than what is recommended for you. Cooking  Avoid adding salt when cooking. Ask your health care provider or dietitian before using salt substitutes.  Season food with salt-free seasonings, spices, or herbs. Check the label of seasoning mixes to make sure they do not contain salt.  Cook with heart-healthy oils, such as olive, canola, soybean, or sunflower oil.  Do not fry foods. Cook foods using low-fat methods, such as baking, boiling, grilling, and broiling.  Limit unhealthy fats when cooking by: ? Removing the skin from poultry, such as chicken. ? Removing all visible fats from meats. ? Skimming the fat off from stews, soups, and gravies before serving them. Meal planning  Limit your intake of: ? Processed, canned, or pre-packaged foods. ? Foods that are high in trans fat, such as fried foods. ? Sweets, desserts, sugary drinks, and other foods with added sugar. ? Full-fat dairy products, such as whole milk.  Eat a balanced diet that includes: ? 4-5 servings of fruit each day and 4-5 servings of vegetables each day. At each meal, try to fill half of your plate with fruits and vegetables. ? Up to 6-8 servings of whole grains each day. ? Up to 2 servings of lean meat, poultry, or fish each day. One serving of meat is equal to 3 oz. This is about the same size as a deck of cards. ? 2 servings of low-fat dairy each day. ? Heart-healthy fats. Healthy fats called omega-3 fatty acids are found in foods such as flaxseed and cold-water fish like sardines, salmon, and mackerel.  Aim to eat 25-35 g (grams) of fiber a day. Foods that are high in fiber include apples, broccoli, carrots,  beans, peas, and whole grains.  Do not add salt or condiments that contain salt (such as soy sauce) to foods before eating.  When eating at a restaurant, ask that your food be prepared with less salt or no salt, if possible.  Try to eat 2 or more vegetarian meals each week.  Eat more home-cooked food and eat less restaurant, buffet, and fast food. Recommended foods The items listed may not be a complete list. Talk with your dietitian about what dietary choices are best for you. Grains Bread with less than 80 mg of sodium per slice. Whole-wheat pasta, quinoa, and brown rice. Oats and oatmeal. Barley. [place]. Grits and cream of wheat. Whole-grain and whole-wheat cold cereal. Vegetables All fresh vegetables. Vegetables that are frozen without sauce or added salt. Low-sodium or sodium-free canned vegetables. Fruits All fresh, frozen, and canned fruits. Dried fruits, such as raisins, prunes, and cranberries. Meats and other protein foods Lean cuts of meat. Skinless chicken and [place]. Fish with high omega-3 fatty acids, such as salmon, sardines, and other cold-water fishes. Eggs. Dried beans, peas, and edamame. Unsalted nuts and nut butters. Dairy Low-fat or nonfat (skim) milk and dried  milk. Rice milk, soy milk, and almond milk. Low-fat or nonfat yogurt. Small amounts of reduced-sodium block cheese. Low-sodium cottage cheese. Fats and oils Olive, canola, soybean, flaxseed, or sunflower oil. Avocado. Sweets and desserts Apple sauce. Granola bars. Sugar-free pudding and gelatin. Frozen fruit bars. Seasoning and other foods Fresh and dried herbs. Lemon or lime juice. Vinegar. Low-sodium ketchup. Salt-free marinades, salad dressings, sauces, and seasonings. Foods to avoid The items listed may not be a complete list. Talk with your dietitian about what dietary choices are best for you. Grains Bread with more than 80 mg of sodium per slice. Hot or cold cereal with more than 140 mg sodium per  serving. Salted pretzels and crackers. Pre-packaged breadcrumbs. Bagels, croissants, and biscuits. Vegetables Canned vegetables. Frozen vegetables with sauce or seasonings. Creamed vegetables. [place] fries. Onion rings. Pickled vegetables and sauerkraut. Fruits Fruits that are dried with sodium-containing preservatives. Meats and other protein foods Ribs and chicken wings. Bacon, ham, pepperoni, bologna, salami, and packaged luncheon meats. Hot dogs, bratwurst, and sausage. Canned meat. Smoked meat and fish. Salted nuts and seeds. Dairy Whole milk, half-and-half, and cream. Buttermilk. Processed cheese, cheese spreads, and cheese curds. Regular cottage cheese. Feta cheese. Shredded cheese. String cheese. Fats and oils Butter, lard, shortening, ghee, and bacon fat. Canned and packaged gravies. Seasoning and other foods Onion salt, garlic salt, table salt, and sea salt. Marinades. Regular salad dressings. Relishes, pickles, and olives. Meat flavorings and tenderizers, and bouillon cubes. Horseradish, ketchup, and mustard. Worcestershire sauce. Teriyaki sauce, soy sauce (including reduced sodium). Hot sauce and Tabasco sauce. Steak sauce, fish sauce, oyster sauce, and cocktail sauce. Taco seasonings. Barbecue sauce. Tartar sauce. Summary  A heart failure eating plan includes changes that limit your intake of sodium and unhealthy fat, and it may help you lose weight or maintain a healthy weight. Your health care provider may also recommend limiting how much fluid you drink.  Most people with heart failure should eat no more than 2,300 mg of salt (sodium) a day. The amount of sodium that is recommended for you may be lower, depending on your condition.  Contact your health care provider or dietitian before making any major changes to your diet. This information is not intended to replace advice given to you by your health care provider. Make sure you discuss any questions you have with your health  care provider. Document Released: 01/26/2017 Document Revised: 01/26/2017 Document Reviewed: 01/26/2017 Elsevier Interactive Patient Education  2018 [place].  Heart Failure Heart failure means your heart has trouble pumping blood. This makes it hard for your body to work well. Heart failure is usually a long-term (chronic) condition. You must take good care of yourself and follow your doctor's treatment plan. Follow these instructions at home:  Take your heart medicine as told by your doctor. ? Do not stop taking medicine unless your doctor tells you to. ? Do not skip any dose of medicine. ? Refill your medicines before they run out. ? Take other medicines only as told by your doctor or pharmacist.  Stay active if told by your doctor. The elderly and people with severe heart failure should talk with a doctor about physical activity.  Eat heart-healthy foods. Choose foods that are without trans fat and are low in saturated fat, cholesterol, and salt (sodium). This includes fresh or frozen fruits and vegetables, fish, lean meats, fat-free or low-fat dairy foods, whole grains, and high-fiber foods. Lentils and dried peas and beans (legumes) are also good choices.  Limit salt if told by your doctor.  Cook in a healthy way. Roast, grill, broil, bake, poach, steam, or stir-fry foods.  Limit fluids as told by your doctor.  Weigh yourself every morning. Do this after you pee (urinate) and before you eat breakfast. Write down your weight to give to your doctor.  Take your blood pressure and write it down if your doctor tells you to.  Ask your doctor how to check your pulse. Check your pulse as told.  Lose weight if told by your doctor.  Stop smoking or chewing tobacco. Do not use gum or patches that help you quit without your doctor's approval.  Schedule and go to doctor visits as told.  Nonpregnant women should have no more than 1 drink a day. Men should have no more than 2 drinks a  day. Talk to your doctor about drinking alcohol.  Stop illegal drug use.  Stay current with shots (immunizations).  Manage your health conditions as told by your doctor.  Learn to manage your stress.  Rest when you are tired.  If it is really hot outside: ? Avoid intense activities. ? Use air conditioning or fans, or get in a cooler place. ? Avoid caffeine and alcohol. ? Wear loose-fitting, lightweight, and light-colored clothing.  If it is really cold outside: ? Avoid intense activities. ? Layer your clothing. ? Wear mittens or gloves, a hat, and a scarf when going outside. ? Avoid alcohol.  Learn about heart failure and get support as needed.  Get help to maintain or improve your quality of life and your ability to care for yourself as needed. Contact a doctor if:  You gain weight quickly.  You are more short of breath than usual.  You cannot do your normal activities.  You tire easily.  You cough more than normal, especially with activity.  You have any or more puffiness (swelling) in areas such as your hands, feet, ankles, or belly (abdomen).  You cannot sleep because it is hard to breathe.  You feel like your heart is beating fast (palpitations).  You get dizzy or light-headed when you stand up. Get help right away if:  You have trouble breathing.  There is a change in mental status, such as becoming less alert or not being able to focus.  You have chest pain or discomfort.  You faint. This information is not intended to replace advice given to you by your health care provider. Make sure you discuss any questions you have with your health care provider. Document Released: 06/20/2008 Document Revised: 02/17/2016 Document Reviewed: 10/28/2012 Elsevier Interactive Patient Education  2017 [place].  Heart Failure Action Plan A heart failure action plan helps you understand what to do when you have symptoms of heart failure. Follow the plan that was  created by you and your health care provider. Review your plan each time you visit your health care provider. Red zone These signs and symptoms mean you should get medical help right away:  You have trouble breathing when resting.  You have a dry cough that is getting worse.  You have swelling or pain in your legs or abdomen that is getting worse.  You suddenly gain more than 2-3 lb (0.9-1.4 kg) in a day, or more than 5 lb (2.3 kg) in one week. This amount may be more or less depending on your condition.  You have trouble staying awake or you feel confused.  You have chest pain.  You do not have  an appetite.  You pass out.  If you experience any of these symptoms:  Call your local emergency services (911 in the [place] right away or seek help at the emergency department of the nearest hospital.  Yellow zone These signs and symptoms mean your condition may be getting worse and you should make some changes:  You have trouble breathing when you are active or you need to sleep with extra pillows.  You have swelling in your legs or abdomen.  You gain 2-3 lb (0.9-1.4 kg) in one day, or 5 lb (2.3 kg) in one week. This amount may be more or less depending on your condition.  You get tired easily.  You have trouble sleeping.  You have a dry cough.  If you experience any of these symptoms:  Contact your health care provider within the next day.  Your health care provider may adjust your medicines.  Green zone These signs mean you are doing well and can continue what you are doing:  You do not have shortness of breath.  You have very little swelling or no new swelling.  Your weight is stable (no gain or loss).  You have a normal activity level.  You do not have chest pain or any other new symptoms.  Follow these instructions at home:  Take over-the-counter and prescription medicines only as told by your health care provider.  Weigh yourself daily. Your target weight  is __________ lb (__________ kg). ? Call your health care provider if you gain more than __________ lb (__________ kg) in a day, or more than __________ lb (__________ kg) in one week.  Eat a heart-healthy diet. Work with a diet and nutrition specialist (dietitian) to create an eating plan that is best for you.  Keep all follow-up visits as told by your health care provider. This is important. Where to find more information:  American Heart Association: www.heart.org Summary  Follow the action plan that was created by you and your health care provider.  Get help right away if you have any symptoms in the Red zone. This information is not intended to replace advice given to you by your health care provider. Make sure you discuss any questions you have with your health care provider. Document Released: 10/21/2016 Document Revised: 10/21/2016 Document Reviewed: 10/21/2016 Elsevier Interactive Patient Education  [employer].

## 2017-12-11 NOTE — Consult Note (Addendum)
   [facility] CM Inpatient Consult   12/11/2017  [name] [name] 04-14-1932 [id number]   Referral received by inpatient case manager for [place] [facility] services and post hospital discharge follow up related to a diagnosis of HF and 3 admits and 3 ED visits in the last 6 months. Patient was evaluated for community based chronic disease management services with [facility] care Management Program as a benefit of patient's Next Gen Medicare. Met with the patient at the bedside to explain [facility] services. Patient was very familiar with services and was happy to reinstate services. Patient stated "My previous [place] Maudie Mercury was so kind and helpful to me." During our conversation patient stated she would appreciate the assistance of a [facility] pharmacist related to having a lot of trouble obtaining her medications from the mail order pharmacy. Patient stated she had currently been out of one of her heart medications for over a week. Verbal consent obtained. Patient acknowledged she already had the [place] and did not need another one. Patient gave [phone] as the best number to reach her. Patient will receive post hospital discharge calls and be evaluated for monthly home visits and a call from a [facility] services does not interfere with or replace any services arranged by the inpatient care management team.  Made inpatient RNCM aware  [facility] will be following for care management. For additional questions please contact:   Emmerich Cryer RN, [facility] Liaison  [phone]) Business Mobile [phone]) Toll free office

## 2017-12-11 NOTE — Discharge Summary (Addendum)
Physician Discharge Summary  [name] [name] PIR:[id number] DOB: 10-26-1931 DOA: 12/09/2017  PCP: Fayrene Helper, MD  Admit date: 12/09/2017 Discharge date: 12/11/2017  Admitted From: Home  Disposition: Home  Recommendations for Outpatient Follow-up:  1. Follow up with PCP in 1 weeks 2. Follow up with cardiology in 2 weeks 3. Please obtain BMP/CBC in one week 4.  Discharge Condition: STABLE   CODE STATUS: FULL    Brief Hospitalization Summary: Please see all hospital notes, images, labs for full details of the hospitalization. HPI: [name] [name] is a 84 y.o. female with medical history significant of history of complete heart block (history of pacemaker and biventricular ICD), history of systolic CHF with greatly improved EF in the past 18 months (see attached results below from the two most recent echocardiograms), persistent atrial fibrillation last year, which has now resolved, stage I chronic kidney disease, constipation, colon polyps, allergies, cataracts, glaucoma, COPD, DJD of the lumbar spine spondylolisthesis/spondylosis, glucose intolerance/type 2 diabetes, GERD, gout, hyperlipidemia, iron deficiency anemia, insomnia, obesity, history of thyroid cancer, thyroidectomy, postsurgical hypothyroidism, vertigo who is coming to the emergency department with complaints of dyspnea and lower extremity edema for the last few days.  ED Course: Initial vital signs temperature 37.2C (99 F), pulse 94, respirations 18, blood pressure 120/75 mmHg and O2 sat 93% on room air.  The patient was given 40 mg of Lasix IVP in the ED.  EKG had a paced rhythm.  Troponin was 0.07 ng/mL.  BNP was 306 pg/mL.Her digoxin level was 0.5 ng/mL.  White count 6.7 with 82% neutrophils, 8% monocytes and 10% lymphocytes.  Hemoglobin 9.0 g/dL and platelets 163.  Her d-dimer was 0.62 mcg/mL.  PT was 30.5 seconds and INR 2.95.  Her BMP shows normal electrolytes, but her glucose was 121, BUN 51, creatinine 1.86 and  magnesium 1.7 mg/dL.  Her chest radiograph showed cardiomegaly with posterior changes, but no edema.    Brief Admission Hx: [name] Jeffersis a 84 y.o.femalewith medical history significant ofhistory of complete heart block (history of pacemaker and biventricular ICD), history of systolic CHF with greatly improved EF in the past 18 months (see attached results below fromthe twomost recent echocardiograms), persistent atrial fibrillation last year, which has now resolved,stage I chronic kidney disease, constipation, colon polyps, allergies, cataracts, glaucoma,COPD, DJD of the lumbar spine spondylolisthesis/spondylosis, glucose intolerance/type 2 diabetes, GERD, gout, hyperlipidemia, iron deficiency anemia, insomnia, obesity, history of thyroid cancer, thyroidectomy, postsurgical hypothyroidism, vertigo who is coming to the emergency department with complaints of dyspnea and lower extremity edema for the last few days.  MDM/Assessment & Plan:   1. Acute volume overload / acute diastolic CHF exacerbation - admit for IV diuresis with lasix.  Monitor weights, I/Os, electrolytes.  Follow clinically.  Pt is hopeful to discharge home today. Follow up with PCP and cardiologist.  2. Hypothyroidism, postsurgical - TSH level WNL. 3. Essential HTN - stable blood pressures, holding losartan for now.  4. GERD - continue protonix for GI protection.  5. COPD - stable, bronchodilators as needed.  6. HLD - continue home pravastatin.  7. Elevated D dimer - VQ scan negative for PE.    DVT prophylaxis: warfarin  Code Status: full  Family Communication:  Disposition Plan: Home   Discharge Diagnoses:  Principal Problem:   Volume overload Active Problems:   Hypothyroidism, postsurgical   Prediabetes   Hyperlipidemia LDL goal <70   Essential hypertension   GERD   Chronic anticoagulation, on coumadin for Mechanical AVR and MVR   COPD (  chronic obstructive pulmonary disease) [facility])  Discharge  Instructions: Discharge Instructions    (HEART FAILURE PATIENTS) Call MD:  Anytime you have any of the following symptoms: 1) 3 pound weight gain in 24 hours or 5 pounds in 1 week 2) shortness of breath, with or without a dry hacking cough 3) swelling in the hands, feet or stomach 4) if you have to sleep on extra pillows at night in order to breathe.   Complete by:  As directed    Call MD for:  difficulty breathing, headache or visual disturbances   Complete by:  As directed    Call MD for:  extreme fatigue   Complete by:  As directed    Call MD for:  persistant dizziness or light-headedness   Complete by:  As directed    Call MD for:  persistant nausea and vomiting   Complete by:  As directed    Diet - low sodium heart healthy   Complete by:  As directed    Increase activity slowly   Complete by:  As directed      Allergies as of 12/11/2017      Reactions   Penicillins Other (See Comments)   Bruise Has patient had a PCN reaction causing immediate rash, facial/tongue/throat swelling, SOB or lightheadedness with hypotension: no Has patient had a PCN reaction causing severe rash involving mucus membranes or skin necrosis: no Has patient had a PCN reaction that required hospitalization: no Has patient had a PCN reaction occurring within the last 10 years: no If all of the above answers are "NO", then may proceed with Cephalosporin use.   Aspirin Other (See Comments)   On coumadin   Fentanyl Dermatitis   Rash with patch   Niacin Itching      Medication List    STOP taking these medications   predniSONE 10 MG tablet Commonly known as:  DELTASONE     TAKE these medications   acetaminophen 500 MG tablet Commonly known as:  TYLENOL Take 500 mg by mouth every 6 (six) hours as needed (pain).   albuterol 108 (90 Base) MCG/ACT inhaler Commonly known as:  VENTOLIN HFA Inhale 2 puffs into the lungs every 6 (six) hours as needed for wheezing or shortness of breath. What changed:   Another medication with the same name was changed. Make sure you understand how and when to take each.   albuterol (2.5 MG/3ML) 0.083% nebulizer solution Commonly known as:  PROVENTIL INHALE 1 VIAL VIA NEBULIZER THREE TIMES DAILY. What changed:  See the new instructions.   allopurinol 100 MG tablet Commonly known as:  ZYLOPRIM Take 1 tablet (100 mg total) by mouth daily.   ARANESP (ALBUMIN FREE) IJ EVERY THURSDAY   azelastine 0.1 % nasal spray Commonly known as:  ASTELIN Place 2 sprays into both nostrils daily as needed for allergies. Use in each nostril as directed What changed:    when to take this  reasons to take this   BREO ELLIPTA 100-25 MCG/INH Aepb Generic drug:  fluticasone furoate-vilanterol Inhale 1 puff into the lungs daily.   calcium carbonate 1250 (500 Ca) MG chewable tablet Commonly known as:  OS-CAL Chew 1 tablet by mouth daily.   cholecalciferol 1000 units tablet Commonly known as:  VITAMIN D Take 1,000 Units by mouth daily.   COUMADIN 5 MG tablet Generic drug:  warfarin Take as directed. If you are unsure how to take this medication, talk to your nurse or doctor. Original instructions:  Take 2  tablets daily except 1 tablet on Tuesdays What changed:  additional instructions   digoxin 0.125 MG tablet Commonly known as:  LANOXIN Take 1 tablet (0.125 mg total) by mouth every other day.   docusate sodium 100 MG capsule Commonly known as:  COLACE Take 300 mg by mouth at bedtime.   fluticasone 50 MCG/ACT nasal spray Commonly known as:  FLONASE Place 2 sprays into both nostrils daily. Pt needs now   folic acid 1 MG tablet Commonly known as:  FOLVITE Take 1 tablet (1 mg total) by mouth daily.   furosemide 40 MG tablet Commonly known as:  LASIX Take 1 tablet by mouth  daily   gabapentin 300 MG capsule Commonly known as:  NEURONTIN Take 1 capsule (300 mg total) by mouth at bedtime.   guaiFENesin 600 MG 12 hr tablet Commonly known as:   MUCINEX Take 1 tablet (600 mg total) by mouth 2 (two) times daily.   hydrALAZINE 25 MG tablet Commonly known as:  APRESOLINE Take 1 tablet (25 mg total) by mouth 3 (three) times daily.   HYDROcodone-acetaminophen 7.5-325 MG tablet Commonly known as:  NORCO Take 1 tablet by mouth every 6 (six) hours as needed for moderate pain (Must last 30 days.).   isosorbide mononitrate 60 MG 24 hr tablet Commonly known as:  IMDUR Take 1 tablet (60 mg total) by mouth daily.   meclizine 25 MG tablet Commonly known as:  ANTIVERT Take 1 tablet (25 mg total) by mouth 3 (three) times daily as needed for dizziness.   metolazone 2.5 MG tablet Commonly known as:  ZAROXOLYN TAKE 1 TABLET ONCE A WEEK What changed:    how much to take  how to take this  additional instructions   metoprolol succinate 100 MG 24 hr tablet Commonly known as:  TOPROL XL Take 1 tablet (100 mg total) by mouth daily. Take with or immediately following a meal.   multivitamin Liqd Take 5 mLs by mouth daily.   ondansetron 4 MG tablet Commonly known as:  ZOFRAN Take 1 tablet (4 mg total) by mouth daily as needed for nausea or vomiting.   potassium chloride SA 20 MEQ tablet Commonly known as:  KLOR-CON M20 Take 1 tablet by mouth  daily   pravastatin 40 MG tablet Commonly known as:  PRAVACHOL Take 1 tablet (40 mg total) by mouth every evening.   SYNTHROID 75 MCG tablet Generic drug:  levothyroxine Take 1 tablet by mouth  daily   SYSTANE OP Place 1 drop into both eyes daily as needed (dry eyes).      Follow-up Information    Fayrene Helper, MD. Schedule an appointment as soon as possible for a visit in 1 week(s).   Specialty:  Family Medicine Why:  Hospital Follow Up  Contact information: [address], [place] [address] [phone]        Herminio Commons, MD. Schedule an appointment as soon as possible for a visit in 2 week(s).   Specialty:  Cardiology Why:  Hospital Follow  Up  Contact information: [place] [address] [phone]        Fran Lowes, MD. Schedule an appointment as soon as possible for a visit in 3 week(s).   Specialty:  Nephrology Why:  Hospital follow Up  Contact information: [address] [place] [address] [phone]        Zoila Shutter, MD. Go on 12/13/2017.   Specialty:  Oncology Why:  as  scheduled Contact information: [place] [address] [phone]          Allergies  Allergen Reactions  . Penicillins Other (See Comments)    Bruise Has patient had a PCN reaction causing immediate rash, facial/tongue/throat swelling, SOB or lightheadedness with hypotension: no Has patient had a PCN reaction causing severe rash involving mucus membranes or skin necrosis: no Has patient had a PCN reaction that required hospitalization: no Has patient had a PCN reaction occurring within the last 10 years: no If all of the above answers are "NO", then may proceed with Cephalosporin use.   . Aspirin Other (See Comments)    On coumadin  . Fentanyl Dermatitis    Rash with patch   . Niacin Itching   Allergies as of 12/11/2017      Reactions   Penicillins Other (See Comments)   Bruise Has patient had a PCN reaction causing immediate rash, facial/tongue/throat swelling, SOB or lightheadedness with hypotension: no Has patient had a PCN reaction causing severe rash involving mucus membranes or skin necrosis: no Has patient had a PCN reaction that required hospitalization: no Has patient had a PCN reaction occurring within the last 10 years: no If all of the above answers are "NO", then may proceed with Cephalosporin use.   Aspirin Other (See Comments)   On coumadin   Fentanyl Dermatitis   Rash with patch   Niacin Itching      Medication List    STOP taking these medications   predniSONE 10 MG tablet Commonly known as:  DELTASONE     TAKE these medications   acetaminophen  500 MG tablet Commonly known as:  TYLENOL Take 500 mg by mouth every 6 (six) hours as needed (pain).   albuterol 108 (90 Base) MCG/ACT inhaler Commonly known as:  VENTOLIN HFA Inhale 2 puffs into the lungs every 6 (six) hours as needed for wheezing or shortness of breath. What changed:  Another medication with the same name was changed. Make sure you understand how and when to take each.   albuterol (2.5 MG/3ML) 0.083% nebulizer solution Commonly known as:  PROVENTIL INHALE 1 VIAL VIA NEBULIZER THREE TIMES DAILY. What changed:  See the new instructions.   allopurinol 100 MG tablet Commonly known as:  ZYLOPRIM Take 1 tablet (100 mg total) by mouth daily.   ARANESP (ALBUMIN FREE) IJ EVERY THURSDAY   azelastine 0.1 % nasal spray Commonly known as:  ASTELIN Place 2 sprays into both nostrils daily as needed for allergies. Use in each nostril as directed What changed:    when to take this  reasons to take this   BREO ELLIPTA 100-25 MCG/INH Aepb Generic drug:  fluticasone furoate-vilanterol Inhale 1 puff into the lungs daily.   calcium carbonate 1250 (500 Ca) MG chewable tablet Commonly known as:  OS-CAL Chew 1 tablet by mouth daily.   cholecalciferol 1000 units tablet Commonly known as:  VITAMIN D Take 1,000 Units by mouth daily.   COUMADIN 5 MG tablet Generic drug:  warfarin Take as directed. If you are unsure how to take this medication, talk to your nurse or doctor. Original instructions:  Take 2 tablets daily except 1 tablet on Tuesdays What changed:  additional instructions   digoxin 0.125 MG tablet Commonly known as:  LANOXIN Take 1 tablet (0.125 mg total) by mouth every other day.   docusate sodium 100 MG capsule Commonly known as:  COLACE Take 300 mg by mouth at bedtime.   fluticasone  50 MCG/ACT nasal spray Commonly known as:  FLONASE Place 2 sprays into both nostrils daily. Pt needs now   folic acid 1 MG tablet Commonly known as:  FOLVITE Take 1 tablet  (1 mg total) by mouth daily.   furosemide 40 MG tablet Commonly known as:  LASIX Take 1 tablet by mouth  daily   gabapentin 300 MG capsule Commonly known as:  NEURONTIN Take 1 capsule (300 mg total) by mouth at bedtime.   guaiFENesin 600 MG 12 hr tablet Commonly known as:  MUCINEX Take 1 tablet (600 mg total) by mouth 2 (two) times daily.   hydrALAZINE 25 MG tablet Commonly known as:  APRESOLINE Take 1 tablet (25 mg total) by mouth 3 (three) times daily.   HYDROcodone-acetaminophen 7.5-325 MG tablet Commonly known as:  NORCO Take 1 tablet by mouth every 6 (six) hours as needed for moderate pain (Must last 30 days.).   isosorbide mononitrate 60 MG 24 hr tablet Commonly known as:  IMDUR Take 1 tablet (60 mg total) by mouth daily.   meclizine 25 MG tablet Commonly known as:  ANTIVERT Take 1 tablet (25 mg total) by mouth 3 (three) times daily as needed for dizziness.   metolazone 2.5 MG tablet Commonly known as:  ZAROXOLYN TAKE 1 TABLET ONCE A WEEK What changed:    how much to take  how to take this  additional instructions   metoprolol succinate 100 MG 24 hr tablet Commonly known as:  TOPROL XL Take 1 tablet (100 mg total) by mouth daily. Take with or immediately following a meal.   multivitamin Liqd Take 5 mLs by mouth daily.   ondansetron 4 MG tablet Commonly known as:  ZOFRAN Take 1 tablet (4 mg total) by mouth daily as needed for nausea or vomiting.   potassium chloride SA 20 MEQ tablet Commonly known as:  KLOR-CON M20 Take 1 tablet by mouth  daily   pravastatin 40 MG tablet Commonly known as:  PRAVACHOL Take 1 tablet (40 mg total) by mouth every evening.   SYNTHROID 75 MCG tablet Generic drug:  levothyroxine Take 1 tablet by mouth  daily   SYSTANE OP Place 1 drop into both eyes daily as needed (dry eyes).       Procedures/Studies: Dg Chest 2 View  Result Date: 12/09/2017 CLINICAL DATA:  Lower extremity edema EXAM: CHEST - 2 VIEW COMPARISON:   November 15, 2017 FINDINGS: There is no edema or consolidation. Heart is mildly enlarged with pulmonary vascularity within normal limits. There is aortic atherosclerosis. There are pacemaker leads attached to the right atrium, middle cardiac vein, and coronary sinus. There are mitral and aortic valve replacements. There is no evident adenopathy. No bone lesions. IMPRESSION: Cardiomegaly with postoperative changes as noted. Aortic atherosclerosis present. No edema or consolidation. Aortic Atherosclerosis (ICD10-I70.0). Electronically Signed   By: Lowella Grip III M.D.   On: 12/09/2017 22:23   Dg Chest 2 View  Result Date: 11/15/2017 CLINICAL DATA:  Productive cough and shortness of breath for 2 weeks. EXAM: CHEST  2 VIEW COMPARISON:  PA and lateral chest 11/01/2017, 10/31/2017, 10/09/2016 and 03/20/2016. CT chest 11/16/2016. FINDINGS: The chest is hyperexpanded. There is cardiomegaly and pulmonary vascular congestion which appear unchanged. No consolidative process, pneumothorax or effusion. AICD is in place. The patient is status post aortic and mitral valve replacement. No acute bony abnormality. IMPRESSION: No acute disease. Cardiomegaly and chronic pulmonary vascular congestion. COPD. Atherosclerosis. Electronically Signed   By: Inge Rise M.D.  On: 11/15/2017 10:14   Nm Pulmonary Perf And Vent  Result Date: 12/10/2017 CLINICAL DATA:  Shortness of breath for 4 days EXAM: NUCLEAR MEDICINE VENTILATION - PERFUSION LUNG SCAN TECHNIQUE: Ventilation images were obtained in multiple projections using inhaled aerosol Tc-69m DTPA. Perfusion images were obtained in multiple projections after intravenous injection of Tc-95m-MAA. RADIOPHARMACEUTICALS:  29.8 mCi of Tc-58m DTPA aerosol inhalation and 4.4 mCi Tc65m-MAA IV COMPARISON:  Chest x-ray 12/09/2016 FINDINGS: Ventilation: Cardiac enlargement but no significant ventilation defects. Perfusion: No wedge shaped peripheral perfusion defects to suggest  acute pulmonary embolism. IMPRESSION: Negative V/Q scan for pulmonary embolism. Electronically Signed   By: Marijo Sanes M.D.   On: 12/10/2017 12:19   US Venous Img Lower Bilateral  Result Date: 11/16/2017 CLINICAL DATA:  Bilateral lower extremity leg pain and edema EXAM: BILATERAL LOWER EXTREMITY VENOUS DOPPLER ULTRASOUND TECHNIQUE: Gray-scale sonography with graded compression, as well as color Doppler and duplex ultrasound were performed to evaluate the lower extremity deep venous systems from the level of the common femoral vein and including the common femoral, femoral, profunda femoral, popliteal and calf veins including the posterior tibial, peroneal and gastrocnemius veins when visible. The superficial great saphenous vein was also interrogated. Spectral Doppler was utilized to evaluate flow at rest and with distal augmentation maneuvers in the common femoral, femoral and popliteal veins. COMPARISON:  None. FINDINGS: RIGHT LOWER EXTREMITY Common Femoral Vein: No evidence of thrombus. Normal compressibility, respiratory phasicity and response to augmentation. Saphenofemoral Junction: No evidence of thrombus. Normal compressibility and flow on color Doppler imaging. Profunda Femoral Vein: No evidence of thrombus. Normal compressibility and flow on color Doppler imaging. Femoral Vein: No evidence of thrombus. Normal compressibility, respiratory phasicity and response to augmentation. Popliteal Vein: No evidence of thrombus. Normal compressibility, respiratory phasicity and response to augmentation. Calf Veins: No evidence of thrombus. Normal compressibility and flow on color Doppler imaging. Superficial Great Saphenous Vein: No evidence of thrombus. Normal compressibility. Venous Reflux:  None. Other Findings: Right popliteal fossa Baker cyst measures 5.5 x 4.8 x 1.3 cm LEFT LOWER EXTREMITY Common Femoral Vein: No evidence of thrombus. Normal compressibility, respiratory phasicity and response to  augmentation. Saphenofemoral Junction: No evidence of thrombus. Normal compressibility and flow on color Doppler imaging. Profunda Femoral Vein: No evidence of thrombus. Normal compressibility and flow on color Doppler imaging. Femoral Vein: No evidence of thrombus. Normal compressibility, respiratory phasicity and response to augmentation. Popliteal Vein: No evidence of thrombus. Normal compressibility, respiratory phasicity and response to augmentation. Calf Veins: No evidence of thrombus. Normal compressibility and flow on color Doppler imaging. Superficial Great Saphenous Vein: No evidence of thrombus. Normal compressibility. Venous Reflux:  None. Other Findings:  None. IMPRESSION: No evidence of deep venous thrombosis. 5.5 cm right Baker's cyst. Electronically Signed   By: Jerilynn Mages.  Shick M.D.   On: 11/16/2017 12:04     Subjective: Pt says that she is feeling a lot better after having diuresed since admission yesterday.  She says the swelling in the legs is much better.    Discharge Exam: Vitals:   12/11/17 0823 12/11/17 0907  BP:    Pulse:  74  Resp:    Temp:    SpO2: 94%    Vitals:   12/10/17 2122 12/11/17 0549 12/11/17 0823 12/11/17 0907  BP: (!) 132/56 123/64    Pulse: 71 70  74  Resp: 18 18    Temp: 99 F (37.2 C) 99.1 F (37.3 C)    TempSrc: Oral Oral    SpO2:  99% 100% 94%   Weight:  96.2 kg (212 lb 1.3 oz)    Height:       General: Pt is alert, awake, not in acute distress Cardiovascular:  normal S1/S2 +, no rubs, no gallops Respiratory: CTA bilaterally, no wheezing, no rhonchi Abdominal: Soft, NT, ND, bowel sounds + Extremities: trace pretibial edema, much improved from yesterday, no cyanosis   The results of significant diagnostics from this hospitalization (including imaging, microbiology, ancillary and laboratory) are listed below for reference.     Microbiology: No results found for this or any previous visit (from the past 240 hour(s)).   Labs: BNP (last 3  results) Recent Labs    11/01/17 1901 11/15/17 0951 12/09/17 2213  BNP 303.0* 184.0* 825.0*   Basic Metabolic Panel: Recent Labs  Lab 12/09/17 2213 12/11/17 0626  NA 143 139  K 4.1 4.2  CL 104 99*  CO2 27 29  GLUCOSE 121* 106*  BUN 51* 44*  CREATININE 1.86* 1.89*  CALCIUM 9.5 9.3  MG 1.7  --    Liver Function Tests: No results for input(s): AST, ALT, ALKPHOS, BILITOT, PROT, ALBUMIN in the last 168 hours. No results for input(s): LIPASE, AMYLASE in the last 168 hours. No results for input(s): AMMONIA in the last 168 hours. CBC: Recent Labs  Lab 12/09/17 2213 12/11/17 0626  WBC 6.7 6.0  NEUTROABS 5.5  --   HGB 9.0* 9.3*  HCT 28.9* 30.3*  MCV 96.3 96.8  PLT 163 156   Cardiac Enzymes: Recent Labs  Lab 12/09/17 2213 12/10/17 0628 12/10/17 1209  TROPONINI 0.07* 0.07* 0.07*   BNP: Invalid input(s): POCBNP CBG: Recent Labs  Lab 12/10/17 1211 12/10/17 1658 12/10/17 2125 12/11/17 0807  GLUCAP 98 98 93 98   D-Dimer Recent Labs    12/09/17 2214  DDIMER 0.62*   Hgb A1c No results for input(s): HGBA1C in the last 72 hours. Lipid Profile No results for input(s): CHOL, HDL, LDLCALC, TRIG, CHOLHDL, LDLDIRECT in the last 72 hours. Thyroid function studies Recent Labs    12/09/17 2213  TSH 0.817   Anemia work up No results for input(s): VITAMINB12, FOLATE, FERRITIN, TIBC, IRON, RETICCTPCT in the last 72 hours. Urinalysis    Component Value Date/Time   COLORURINE STRAW (A) 11/15/2017 1147   APPEARANCEUR CLEAR 11/15/2017 1147   LABSPEC 1.006 11/15/2017 1147   PHURINE 5.0 11/15/2017 1147   GLUCOSEU NEGATIVE 11/15/2017 1147   GLUCOSEU NEG mg/dL 03/22/2010 2313   HGBUR NEGATIVE 11/15/2017 1147   HGBUR negative 05/27/2010 1122   BILIRUBINUR NEGATIVE 11/15/2017 1147   BILIRUBINUR neg 03/11/2013 1443   KETONESUR NEGATIVE 11/15/2017 1147   PROTEINUR NEGATIVE 11/15/2017 1147   UROBILINOGEN 0.2 03/11/2013 1443   UROBILINOGEN 0.2 03/27/2012 1201   NITRITE  NEGATIVE 11/15/2017 1147   LEUKOCYTESUR NEGATIVE 11/15/2017 1147   Sepsis Labs Invalid input(s): PROCALCITONIN,  WBC,  LACTICIDVEN Microbiology No results found for this or any previous visit (from the past 240 hour(s)).  Time coordinating discharge:   SIGNED:  Irwin Brakeman, MD  [facility] 12/11/2017, 11:53 AM Pager [phone]  If 7PM-7AM, please contact night-coverage [email or web address] Password [email or web address]

## 2017-12-11 NOTE — Progress Notes (Signed)
AVS reviewed with patient.  Verbalized understanding of discharge instructions, physician follow-up, medications.  [facility] delivered tank for transport home. Patient transported by NT via w/c to main entrance at discharge.  Patient stable at time of discharge.

## 2017-12-11 NOTE — Care Management Note (Addendum)
Case Management Note  Patient Details  Name: Anyelina Claycomb MRN: [id number] Date of Birth: September 22, 1932  Expected Discharge Date:  12/11/17               Expected Discharge Plan:  [place]  In-House Referral:  NA  Discharge planning Services  CM Consult  Post Acute Care Choice:  Home Health, Resumption of Svcs/PTA Provider Choice offered to:  Patient  HH Arranged:   RN, PT [place] Agency:  [place]  Status of Service:  Completed, signed off  If discussed at [place] of Stay Meetings, dates discussed:    Additional Comments: Pt discharging home today with resumption of [place] services. Pt aware HH has 48 hrs to make first visit. Juliann Pulse, [facility] rep, aware of DC today. Pt has home oxygen through [place]. She will need port tank to get home with, Ashly, [employer] rep, aware of need and will have tank delivered to room prior to DC. Referral made to [facility] for CM consult and emmi calls. Pt aware and requesting referral as she has had them in the past.   Sherald Barge, RN 12/11/2017, 2:28 PM

## 2017-12-12 ENCOUNTER — Telehealth: Payer: Self-pay

## 2017-12-12 ENCOUNTER — Other Ambulatory Visit: Payer: Self-pay | Admitting: *Deleted

## 2017-12-12 ENCOUNTER — Telehealth: Payer: Self-pay | Admitting: Family Medicine

## 2017-12-12 ENCOUNTER — Other Ambulatory Visit: Payer: Self-pay | Admitting: Pharmacist

## 2017-12-12 DIAGNOSIS — N184 Chronic kidney disease, stage 4 (severe): Secondary | ICD-10-CM | POA: Diagnosis not present

## 2017-12-12 DIAGNOSIS — D6832 Hemorrhagic disorder due to extrinsic circulating anticoagulants: Secondary | ICD-10-CM | POA: Diagnosis not present

## 2017-12-12 DIAGNOSIS — J441 Chronic obstructive pulmonary disease with (acute) exacerbation: Secondary | ICD-10-CM | POA: Diagnosis not present

## 2017-12-12 DIAGNOSIS — I5023 Acute on chronic systolic (congestive) heart failure: Secondary | ICD-10-CM | POA: Diagnosis not present

## 2017-12-12 DIAGNOSIS — I130 Hypertensive heart and chronic kidney disease with heart failure and stage 1 through stage 4 chronic kidney disease, or unspecified chronic kidney disease: Secondary | ICD-10-CM | POA: Diagnosis not present

## 2017-12-12 DIAGNOSIS — M5136 Other intervertebral disc degeneration, lumbar region: Secondary | ICD-10-CM | POA: Diagnosis not present

## 2017-12-12 NOTE — Telephone Encounter (Signed)
Called for TOC, twice, phone rang nonstop and never went to voicemail.

## 2017-12-12 NOTE — Telephone Encounter (Signed)
Referral faxed

## 2017-12-12 NOTE — Telephone Encounter (Signed)
[name] called back to request a portable oxygen tank order from [name]

## 2017-12-12 NOTE — Telephone Encounter (Signed)
Kim RN with [facility] called and said [name] [name] is requesting a portable oxygen order.  [employer] [id number].

## 2017-12-12 NOTE — Telephone Encounter (Signed)
Transition Care Management Follow-up Telephone Call   Date discharged?   12/11/17             How have you been since you were released from the hospital? [place] you understand why you were in the hospital? Yes   Do you understand the discharge instructions? Yes   Where were you discharged to? Home   Items Reviewed:  Medications reviewed: Yes- Pravastatin changed to 40mg   Allergies reviewed: Yes  Dietary changes reviewed: Yes. Low-sodium  Referrals reviewed: Yes. No new referrals   Functional Questionnaire:   Activities of Daily Living (ADLs):  Has home health referral for therapy. Otherwise indepenedent    Any transportation issues/concerns?: No   Any patient concerns? Patient [facility] is handling this. She has oxygen concentrator, so she will not run out   Confirmed importance and date/time of follow-up visits scheduled    Yes  Confirmed with patient if condition begins to worsen call PCP or go to the ER.  Patient was given the office number and encouraged to call back with question or concerns.  :  Yes

## 2017-12-12 NOTE — Telephone Encounter (Signed)
pls sort this out pt needs oxygen the number entered I tried two different ext I spoke to [place] but she is not at [employer]. Give verbal oK for 2 l oxygen portable , if I need to speak to kim I will, thanks Pt does need the oxygen

## 2017-12-12 NOTE — Patient Outreach (Signed)
[place] [facility]) Care Management  12/12/2017  [name] [name] 08/24/1932 [id number]   Patient was called regarding medication review post discharge. HIPAA identifiers were obtained. Before I was able to review the patient's medications, she told me she was worried about her oxygen. I asked her to give me the phone number on the front of her oxygen tank and I called them. The company was [employer] and the corporate number was able to connect me with the local office.   I spoke with Estill Bamberg at [place] and explained to her that the patient was concerned that her oxygen was running out. Estill Bamberg said the patient has a home concentrator that makes it own oxygen and would not run out. However, the patient is currently being reviewed for coverage of a portable concentrator so she can walk around with it.  This information was relayed to the patient. Just before being able to go over her medications, the patient received a phone call from her provider's office stating they wanted to speak with her about her medications.  Patient asked that I call her back.   Plan: Call the patient back within 1 business day.  Elayne Guerin, PharmD, [place] Clinical Pharmacist [phone]

## 2017-12-13 ENCOUNTER — Ambulatory Visit (HOSPITAL_COMMUNITY): Payer: Medicare Other

## 2017-12-13 ENCOUNTER — Other Ambulatory Visit (HOSPITAL_COMMUNITY): Payer: Medicare Other

## 2017-12-13 ENCOUNTER — Encounter: Payer: Self-pay | Admitting: Pharmacist

## 2017-12-13 ENCOUNTER — Other Ambulatory Visit: Payer: Self-pay | Admitting: Pharmacist

## 2017-12-13 ENCOUNTER — Ambulatory Visit (HOSPITAL_COMMUNITY): Payer: Medicare Other | Admitting: Internal Medicine

## 2017-12-13 DIAGNOSIS — I5023 Acute on chronic systolic (congestive) heart failure: Secondary | ICD-10-CM | POA: Diagnosis not present

## 2017-12-13 DIAGNOSIS — D6832 Hemorrhagic disorder due to extrinsic circulating anticoagulants: Secondary | ICD-10-CM | POA: Diagnosis not present

## 2017-12-13 DIAGNOSIS — I130 Hypertensive heart and chronic kidney disease with heart failure and stage 1 through stage 4 chronic kidney disease, or unspecified chronic kidney disease: Secondary | ICD-10-CM | POA: Diagnosis not present

## 2017-12-13 DIAGNOSIS — J441 Chronic obstructive pulmonary disease with (acute) exacerbation: Secondary | ICD-10-CM | POA: Diagnosis not present

## 2017-12-13 DIAGNOSIS — M5136 Other intervertebral disc degeneration, lumbar region: Secondary | ICD-10-CM | POA: Diagnosis not present

## 2017-12-13 DIAGNOSIS — N184 Chronic kidney disease, stage 4 (severe): Secondary | ICD-10-CM | POA: Diagnosis not present

## 2017-12-13 NOTE — Patient Outreach (Signed)
[facility] [facility]) Care Management  [place]   12/13/2017  [name] [name] 10-Oct-1931 [id number]  Subjective: Patient was called regarding post discharge medication review. HIPAA identifiers were obtained. Patient is an 84 yo female with multiple medical conditions including but not limited to:   CHF, CKD, COPD, HTN, hyperlipidemia DJD, GERD, hypothyroidism, osteoarthritis, spinal stenosis, complete heart block (sp pacemaker and ICD placement), Mechanical AVR and MVR, and O2 use.  Patient said she manages her own medications but has home health nursing care with [facility] currently.  She was hospitalized 3/17/-19-3/19-19 for dyspnea and lower extremity edema.  Objective:   Encounter Medications: Outpatient Encounter Medications as of 12/13/2017  Medication Sig  . acetaminophen (TYLENOL) 500 MG tablet Take 500 mg by mouth every 6 (six) hours as needed (pain).  Marland Kitchen albuterol (PROVENTIL) (2.5 MG/3ML) 0.083% nebulizer solution INHALE 1 VIAL VIA NEBULIZER THREE TIMES DAILY. (Patient taking differently: INHALE 1 VIAL VIA NEBULIZER THREE TIMES DAILY AS NEEDED)  . albuterol (VENTOLIN HFA) 108 (90 Base) MCG/ACT inhaler Inhale 2 puffs into the lungs every 6 (six) hours as needed for wheezing or shortness of breath.  . allopurinol (ZYLOPRIM) 100 MG tablet Take 1 tablet (100 mg total) by mouth daily.  Marland Kitchen azelastine (ASTELIN) 0.1 % nasal spray Place 2 sprays into both nostrils daily as needed for allergies. Use in each nostril as directed  . BREO ELLIPTA 100-25 MCG/INH AEPB Inhale 1 puff into the lungs daily.   . cholecalciferol (VITAMIN D) 1000 UNITS tablet Take 1,000 Units by mouth daily.  Marland Kitchen COUMADIN 5 MG tablet Take 2 tablets daily except 1 tablet on Tuesdays (Patient taking differently: Take 1 tablet Tuesday, Thursday, and Saturday and  Take 2 tablets all other days.)  . digoxin (LANOXIN) 0.125 MG tablet Take 1 tablet (0.125 mg total) by mouth every other day.  . docusate  sodium (COLACE) 100 MG capsule Take 300 mg by mouth at bedtime.  . fluticasone (FLONASE) 50 MCG/ACT nasal spray Place 2 sprays into both nostrils daily. Pt needs now  . folic acid (FOLVITE) 1 MG tablet Take 1 tablet (1 mg total) by mouth daily.  . furosemide (LASIX) 40 MG tablet Take 1 tablet by mouth  daily  . gabapentin (NEURONTIN) 300 MG capsule Take 1 capsule (300 mg total) by mouth at bedtime.  Marland Kitchen guaiFENesin (MUCINEX) 600 MG 12 hr tablet Take 1 tablet (600 mg total) by mouth 2 (two) times daily.  . hydrALAZINE (APRESOLINE) 25 MG tablet Take 1 tablet (25 mg total) by mouth 3 (three) times daily.  Marland Kitchen HYDROcodone-acetaminophen (NORCO) 7.5-325 MG tablet Take 1 tablet by mouth every 6 (six) hours as needed for moderate pain (Must last 30 days.).  Marland Kitchen isosorbide mononitrate (IMDUR) 60 MG 24 hr tablet Take 1 tablet (60 mg total) by mouth daily.  . meclizine (ANTIVERT) 25 MG tablet Take 1 tablet (25 mg total) by mouth 3 (three) times daily as needed for dizziness.  . metolazone (ZAROXOLYN) 2.5 MG tablet TAKE 1 TABLET ONCE A WEEK (Patient taking differently: Take 2.5 mg by mouth. TAKE 1 TABLET ONCE A WEEK (Wednesday))  . metoprolol succinate (TOPROL XL) 100 MG 24 hr tablet Take 1 tablet (100 mg total) by mouth daily. Take with or immediately following a meal.  . Multiple Vitamin (MULTIVITAMIN) LIQD Take 5 mLs by mouth daily.  . ondansetron (ZOFRAN) 4 MG tablet Take 1 tablet (4 mg total) by mouth daily as needed for nausea or vomiting.  [name] Glycol-Propyl  Glycol (SYSTANE OP) Place 1 drop into both eyes daily as needed (dry eyes).  . potassium chloride SA (KLOR-CON M20) 20 MEQ tablet Take 1 tablet by mouth  daily  . pravastatin (PRAVACHOL) 40 MG tablet Take 1 tablet (40 mg total) by mouth every evening.  Marland Kitchen SYNTHROID 75 MCG tablet Take 1 tablet by mouth  daily  . calcium carbonate (OS-CAL) 1250 (500 Ca) MG chewable tablet Chew 1 tablet by mouth daily.  . Darbepoetin Alfa (ARANESP, ALBUMIN FREE, IJ)  EVERY THURSDAY   Facility-Administered Encounter Medications as of 12/13/2017  Medication  . epoetin alfa (EPOGEN,PROCRIT) injection 40,000 Units    Functional Status: In your present state of health, do you have any difficulty performing the following activities: 12/10/2017 11/15/2017  Hearing? N N  Vision? N N  Difficulty concentrating or making decisions? N N  Walking or climbing stairs? Y N  Dressing or bathing? N N  Doing errands, shopping? N N  Preparing Food and eating ? - -  Using the Toilet? - -  In the past six months, have you accidently leaked urine? - -  Do you have problems with loss of bowel control? - -  Managing your Medications? - -  Managing your Finances? - -  Housekeeping or managing your Housekeeping? - -  Some recent data might be hidden    Fall/Depression Screening: Fall Risk  06/28/2017 04/26/2017 03/15/2017  Falls in the past year? No No No  Number falls in past yr: - - -  Injury with Fall? - - -  Risk for fall due to : - Impaired balance/gait;Impaired mobility;Medication side effect Impaired mobility;Medication side effect;Impaired balance/gait  Follow up - - -   PHQ 2/9 Scores 06/28/2017 04/26/2017 03/20/2017 03/15/2017 03/12/2017 11/30/2016 11/03/2016  PHQ - 2 Score 0 0 0 0 0 1 0  PHQ- 9 Score - - - - - - -    ASSESSMENT: Date Discharged from Hospital: 12/11/17 Date Medication Reconciliation Performed: 12/13/2017  Medications Discontinued at Discharge:   prednisone  No new medications were prescribed at discharge.  Patient was recently discharged from hospital and all medications have been reviewed   Drugs sorted by system:  Cardiovascular: Warfarin Pravastatin Metoprolol XL Metolazone Isosorbide MN Hydralazine Furosemide Digoxin  Pulmonary/Allergy: Albuterol nebulizer Ventolin HFA Azelastine Fluticasone Breo Guaifenesin  Gastrointestinal: Ondansetron Meclizine Docusate Polyethylene  Glycol  Endocrine: Levothyroxine   Renal: Allopurinol  Pain: Acetaminophen Hydrocodone/Acetaminophen  Vitamins/Minerals: Multiple vitamin Folic Acid Cholecalciferol Potassium  Medication Review Findings: Adherence-patient said she had been out of metoprolol for several months.  A new prescription was sent to [facility] and she said she would have someone pick that one up.    Patient also said she prefers to get her medications filled through [facility] mail order pharmacy.  [employer] mail order pharmacy was called.  The patient had several prescriptions already on her profile with them.  The following were placed on automatic refill: Metoprolol Xl, Fluticasone Nasal Spray, Klor Con, metolazone, furosemide, and Synthroid.    Isosorbide MN was also on the profile at [facility] but is not available for automatic refill.  Patient was educated on how to call in for a refill with [employer].  In the past she had been calling seven days in advance and then running out of medication. The [employer] representative said patients can call up to 30 days in advance.  Today, the only prescription that could be refilled was Fluticasone.  Synthroid and Allopurinol are available on 12/19/17 and will  now be automatically refilled and sent to the patient.  All of her available chronic medications with the exception of Isosorbide MN will be automatically refilled and sent to the patient when they are available for refill. (Since Metoprolol was filled at [facility] 12/11/17, it will not be available via mail until next month.   PLAN: Route note to patient's PCP Consult patient's Community Nurse, Joellyn Quails Close patient's pharmacy case. Patient has my contact information and understands she can call me with medication questions or concerns.  Elayne Guerin, PharmD, [place] Clinical Pharmacist [phone]

## 2017-12-14 DIAGNOSIS — J441 Chronic obstructive pulmonary disease with (acute) exacerbation: Secondary | ICD-10-CM | POA: Diagnosis not present

## 2017-12-14 DIAGNOSIS — I130 Hypertensive heart and chronic kidney disease with heart failure and stage 1 through stage 4 chronic kidney disease, or unspecified chronic kidney disease: Secondary | ICD-10-CM | POA: Diagnosis not present

## 2017-12-14 DIAGNOSIS — I5023 Acute on chronic systolic (congestive) heart failure: Secondary | ICD-10-CM | POA: Diagnosis not present

## 2017-12-14 DIAGNOSIS — M5136 Other intervertebral disc degeneration, lumbar region: Secondary | ICD-10-CM | POA: Diagnosis not present

## 2017-12-14 DIAGNOSIS — N184 Chronic kidney disease, stage 4 (severe): Secondary | ICD-10-CM | POA: Diagnosis not present

## 2017-12-14 DIAGNOSIS — D6832 Hemorrhagic disorder due to extrinsic circulating anticoagulants: Secondary | ICD-10-CM | POA: Diagnosis not present

## 2017-12-17 DIAGNOSIS — M5136 Other intervertebral disc degeneration, lumbar region: Secondary | ICD-10-CM | POA: Diagnosis not present

## 2017-12-17 DIAGNOSIS — D6832 Hemorrhagic disorder due to extrinsic circulating anticoagulants: Secondary | ICD-10-CM | POA: Diagnosis not present

## 2017-12-17 DIAGNOSIS — I5023 Acute on chronic systolic (congestive) heart failure: Secondary | ICD-10-CM | POA: Diagnosis not present

## 2017-12-17 DIAGNOSIS — N184 Chronic kidney disease, stage 4 (severe): Secondary | ICD-10-CM | POA: Diagnosis not present

## 2017-12-17 DIAGNOSIS — J441 Chronic obstructive pulmonary disease with (acute) exacerbation: Secondary | ICD-10-CM | POA: Diagnosis not present

## 2017-12-17 DIAGNOSIS — I130 Hypertensive heart and chronic kidney disease with heart failure and stage 1 through stage 4 chronic kidney disease, or unspecified chronic kidney disease: Secondary | ICD-10-CM | POA: Diagnosis not present

## 2017-12-18 ENCOUNTER — Other Ambulatory Visit: Payer: Self-pay

## 2017-12-18 ENCOUNTER — Telehealth: Payer: Self-pay | Admitting: Family Medicine

## 2017-12-18 ENCOUNTER — Other Ambulatory Visit: Payer: Self-pay | Admitting: *Deleted

## 2017-12-18 DIAGNOSIS — I130 Hypertensive heart and chronic kidney disease with heart failure and stage 1 through stage 4 chronic kidney disease, or unspecified chronic kidney disease: Secondary | ICD-10-CM | POA: Diagnosis not present

## 2017-12-18 DIAGNOSIS — D6832 Hemorrhagic disorder due to extrinsic circulating anticoagulants: Secondary | ICD-10-CM | POA: Diagnosis not present

## 2017-12-18 DIAGNOSIS — N184 Chronic kidney disease, stage 4 (severe): Secondary | ICD-10-CM | POA: Diagnosis not present

## 2017-12-18 DIAGNOSIS — I5023 Acute on chronic systolic (congestive) heart failure: Secondary | ICD-10-CM | POA: Diagnosis not present

## 2017-12-18 DIAGNOSIS — J441 Chronic obstructive pulmonary disease with (acute) exacerbation: Secondary | ICD-10-CM | POA: Diagnosis not present

## 2017-12-18 DIAGNOSIS — M5136 Other intervertebral disc degeneration, lumbar region: Secondary | ICD-10-CM | POA: Diagnosis not present

## 2017-12-18 NOTE — Progress Notes (Signed)
She has been hospitalized 3 times in the last 2 months and Cardiology was never involved, as far as I can tell. Her ICD device can be useful to guide assessment of volume status, which is otherwise very challenging due to obesity and advanced CKD I would recommend Cardiology Consultation whenever she presents with dyspnea and suspicion of HF, we may be able to help prevent repeat admission. Margarita Grizzle, please enroll in device CHF follow up. Billie, please arrange Cardiology follow up in next 7-14 days. MCr

## 2017-12-18 NOTE — Patient Outreach (Signed)
[facility]) Care Management  12/18/2017  Leni Pankonin 1932/05/23 [id number]  Transition of care week two   Mrs Duhart called CM today after she received [facility] CM letter. CM and Mrs Devivo discussed the letter and [facility] CM transition of care services  Her concerns discussed today are not medical She asked questions about getting a smaller portable oxygen tank although she confirms she has already been informed by [employer] about her present concentrator, extra long tubing used to allow ambulation from room to room and tank of oxygen that she has received.  She voices wanting a lighter tank of oxygen she heard about on her tv and from a relative.  She was referred back to [facility] and confirms she will contact them today via telephone.  Another concern of hers was "getting someone out here to help me until I get better"  She confirms she has home health (HH)services and a [place] RN visited her today.  She confirms she spoke with the [facility] RN about "getting someone out here to help me until I get better" and was informed the [facility] RN would inquire abut a possible [place] aide availability.  CM discussed the process of her contacting Dr [name] for an order for [facility] aide and the agency checking for [facility] aide availability.  She confirms she will follow up the [facility] RN about the status of the [facility] aide. Also educated her about a [place] after she discussed that "my cousin has an occupational therapy."  She decided that a HHOT was not needed by her. Sh confirms her nephew and niece are still living with her and assisting her. Offered ADTS contact number to check on Personal care services [facility]) aide services and cost but she wants to await a response from [facility] RN  CHF confirms no sob, wearing O2, wt today 212 lbs and she was this wt at d/c. She informs CM she is following her low sodium heart healthy diet. Encouragement was given.  Denies orthopnea, chest pain or edema.  She is reporting checking the CHF zones and  symptoms   DM cbg 113 this am States she is trying to manage with diet and medicine. Still denies a lot of activity  COPD wearing oxygen as ordered Denies sob or low sats per her [place] CM cancelled 01/19/18 call appointment and will contact Mrs Pangelinan for further transition of care services in 1-2 weeks Routed to MDs  [place] Problem One     Most Recent Value  Care Plan Problem One  possible lighter portable oxygen tank  Role Documenting the Problem One  Care Management [place] for Problem One  Active  THN Long Term Goal   over the next 31 days patient will call [employer] to discuss her options of getting a lighter oxygen tank  THN Long Term Goal Start Date  12/18/17  Interventions for Problem One Long Term Goal  offered [employer] contact information, educated her about portable tanks and some possible out of pocket expense, confirmed she had the extra long tubing, concentrator and tank previous discussed by CM, MD and [employer],, answered her questions  THN CM Short Term Goal #1   over the next 7 days patient will speak with [employer]  THN CM Short Term Goal #1 Start Date  12/18/17  Interventions for Short Term Goal #1  Assess for need of portable oxygen, confirmed she receiveed oxygen tan, and extra tubing previously discussed, Offer [employer] contact number,  encouraged her to ask specific questions of [employer] about a lighter tank (cost, out of pocket expense, coverage information) answered questions Encouraged to call today     [place] Problem Two     Most Recent Value  Care Plan Problem Two  home health or personal care aide services wanted by patient  Role Documenting the Problem Two  Care Management [place] for Problem Two  Active  Interventions for Problem Two Long Term Goal   discussed differences in home health aide and personal care services, offered ADTS number to check on personal care services, Discuss costs and availability in the  home, educated on [facility] services after her inquiry, answered questions   THN Long Term Goal  over the next 31 days patient will inquire and be offered home health or personal care services she is requesting if available  [facility] Long Term Goal Start Date  12/18/17  [facility] CM Short Term Goal #1   over the net 7 days patient will follow up on hh aide servicees via her home health agency staff  [facility] CM Short Term Goal #1 Start Date  12/18/17  Interventions for Short Term Goal #2   discussed home health aid vs personal care services statf (cost, availability, etc) and the proess/referral to obtain services, offered ADTS contact information, encouraged her to contact home health or ADTS        Joelene Millin L. Lavina [name], RN, BSN, [place] Coordinator [phone] week day mobile

## 2017-12-18 NOTE — Patient Outreach (Signed)
[place] [facility]) Care Management  12/18/2017  [name] [name] 03/10/1932 [id number]   [facility] CM Care Plan Problem One     Most Recent Value  Care Plan Problem One  oxygen needs  Role Documenting the Problem One  Care Management Coordinator  Care Plan for Problem One  Active  THN Long Term Goal   over the next 31 days patient will receive portable oxygen needs to include portable oxygen and tubing from [place] Term Goal Start Date  12/12/17  [facility] Long Term Goal Met Date  12/18/17  Interventions for Problem One Long Term Goal  consult with [facility] pharmacist, MD office staff and patient about oxygen needs to include portable tanks, extra tubing to allow mobility in the home, her concentrator operations,evaluate her oxygen needs  THN CM Short Term Goal #1   over the next 7 days patient will speak with [employer]  THN CM Short Term Goal #1 Start Date  12/12/17  [facility] CM Short Term Goal #1 Met Date  12/18/17  Interventions for Short Term Goal #1  coordinated orders neded from MD office for patient needs offer [employer] contact information encouraged her to contact [employer] about her portable tank and tubing needs, answer her questions about hre needs and the process to get the DME need     [facility] CM Care Plan Problem Two     Most Recent Value  Care Plan Problem Two  knowledge of home care for CHF and COPD   Role Documenting the Problem Two  Care Management Coordinator  Care Plan for Problem Two  Active  Interventions for Problem Two Long Term Goal   Referred her back to [facility] calendar and d/c instructions to review  THN Long Term Goal  over the next 7 days patient will be able to verbalize knowledge of chf and copd zones and symptoms to report to MDs per her previous education received from [place] Term Goal Start Date  12/12/17  [facility] Long Term Goal Met Date  12/18/17  THN CM Short Term Goal #1   over the net 7 days patient will verbalize understanding of CHf zones and when to call MD  for weight increase 3-5 over d/c wt fof 211.3 lbs   THN CM Short Term Goal #1 Start Date  12/12/17  [facility] CM Short Term Goal #1 Met Date   12/18/17  Interventions for Short Term Goal #2   review hospital weight as 311.3 bs and provider the weight of 315-16 to call MD with if needed, reviewed CHf zones, used teach back method to assess understanding of zones and who to call if she has increased symptoms transition of care assessment completed       Kimberly L. Lavina [name], RN, BSN, [place] Coordinator [phone] week day mobile

## 2017-12-18 NOTE — Telephone Encounter (Signed)
Patient is requesting a portable oxygen tank to replace her old heavy one

## 2017-12-18 NOTE — Patient Outreach (Signed)
[facility]) Care Management  12/12/2017  Marypat Kimmet 10-29-1931 [id number]   Transition of care week one  [facility] CM visited with Dr [name] RN and office manager.  Discussed pt call to primary Care MD office for inquiry about oxygen DME. CM discussed EPIC indicated that [facility] pharmacist Abbey Chatters had spoken with Mrs Woods about her oxygen and had called CM to update CM that questions and concerns were addressed.  Explained that Mr Hargrove has a concentrator for oxygen at the home and will not run out of oxygen but may need assist with portable tanks or extra tubing to be able to ambulate better in the home.   Cm called and spoke with Mrs Sandin to initiate Transition of care services and discussed contact with Abbey Chatters, Dr Griffin Dakin RN and office managers.   COPD CM discussed with hat her concentrator will not run out of oxygen, and [employer] can assist with a portable tank and extra tubing to assist her with ambulating in her home longer distances.   Her home health RN visited today  Next MD office visit is on 12/24/17 with Dr [name] Primary   CHF re reviewed CHF zones and who to contact for symptoms Cm reminded her to weigh and to report weight 3-5 lbs over her last hospital weight to her MD or CV MD. Cm discussed with her that if she gets to at weight of 215-216  Lbs she is to call She voiced understanding  She was reminded to follow her low sodium heart healthy diet and to stop eating food with high sodium content Her wt was  211.3 lbs  Leaving the hospital She reports a weight today of 212 lbs. She reports the [facility] RN measured her ankles today She reports stopping visits with her pulmonologist a month ago but will check to see if she needs to be referred back to the pulmonologist Medications She confirms her chol was changed to 20 by NP simpson office told her to take 40   Plans continue to follow up with Mrs Dam in 1-2 weeks for further [facility] transitional services  Route  notes to MDs [place] CM Care Plan Problem One     Most Recent Value  Care Plan Problem One  oxygen needs  Role Documenting the Problem One  Care Management Coordinator  Care Plan for Problem One  Active  [facility] Long Term Goal   over the next 31 days patient will receive portable oxygen needs to include portable oxygen and tubing from [place] Term Goal Start Date  12/12/17  Interventions for Problem One Long Term Goal  consult with [facility] pharmacist, MD office staff and patient about oxygen needs to include portable tanks, extra tubing to allow mobility in the home, her concentrator operations,evaluate her oxygen needs  [facility] CM Short Term Goal #1   over the next 7 days patient will speak with [employer]  [facility] CM Short Term Goal #1 Start Date  12/12/17  Interventions for Short Term Goal #1  coordinated orders neded from MD office for patient needs offer [employer] contact information encouraged her to contact [employer] about her portable tank and tubing needs, answer her questions about hre needs and the process to get the DME need     [facility] CM Care Plan Problem Two     Most Recent Value  Care Plan Problem Two  knowledge of home care for CHF and COPD   Role Documenting the Problem Two  Care Management  Coordinator  Care Plan for Problem Two  Active  Interventions for Problem Two Long Term Goal   Referred her back to [facility] calendar and d/c instructions to review  [facility] Long Term Goal  over the next 7 days patient will be able to verbalize knowledge of chf and copd zones and symptoms to report to MDs per her previous education received from [place] Term Goal Start Date  12/12/17  [facility] CM Short Term Goal #1   over the net 7 days patient will verbalize understanding of CHf zones and when to call MD for weight increase 3-5 over d/c wt fof 211.3 lbs   [facility] CM Short Term Goal #1 Start Date  12/12/17  Interventions for Short Term Goal #2   review hospital weight as 311.3 bs and provider the weight of 315-16 to call  MD with if needed, reviewed CHf zones, used teach back method to assess understanding of zones and who to call if she has increased symptoms transition of care assessment completed        Dilpreet Faires L. Lavina [name], RN, BSN, [place] Coordinator [phone] week day mobile

## 2017-12-19 ENCOUNTER — Inpatient Hospital Stay (HOSPITAL_COMMUNITY): Payer: Medicare Other

## 2017-12-19 ENCOUNTER — Inpatient Hospital Stay (HOSPITAL_COMMUNITY): Payer: Medicare Other | Attending: Internal Medicine

## 2017-12-19 ENCOUNTER — Encounter (HOSPITAL_COMMUNITY): Payer: Self-pay

## 2017-12-19 ENCOUNTER — Other Ambulatory Visit: Payer: Self-pay

## 2017-12-19 ENCOUNTER — Ambulatory Visit: Payer: Self-pay | Admitting: *Deleted

## 2017-12-19 VITALS — BP 108/49 | HR 72 | Temp 98.2°F | Resp 24

## 2017-12-19 DIAGNOSIS — N184 Chronic kidney disease, stage 4 (severe): Secondary | ICD-10-CM | POA: Diagnosis not present

## 2017-12-19 DIAGNOSIS — D631 Anemia in chronic kidney disease: Secondary | ICD-10-CM | POA: Insufficient documentation

## 2017-12-19 DIAGNOSIS — D649 Anemia, unspecified: Secondary | ICD-10-CM

## 2017-12-19 DIAGNOSIS — E611 Iron deficiency: Secondary | ICD-10-CM

## 2017-12-19 LAB — CBC WITH DIFFERENTIAL/PLATELET
Basophils Absolute: 0 10*3/uL (ref 0.0–0.1)
Basophils Relative: 0 %
Eosinophils Absolute: 0 10*3/uL (ref 0.0–0.7)
Eosinophils Relative: 0 %
HCT: 29.5 % — ABNORMAL LOW (ref 36.0–46.0)
Hemoglobin: 9.4 g/dL — ABNORMAL LOW (ref 12.0–15.0)
Lymphocytes Relative: 13 %
Lymphs Abs: 0.9 10*3/uL (ref 0.7–4.0)
MCH: 30.4 pg (ref 26.0–34.0)
MCHC: 31.9 g/dL (ref 30.0–36.0)
MCV: 95.5 fL (ref 78.0–100.0)
Monocytes Absolute: 0.8 10*3/uL (ref 0.1–1.0)
Monocytes Relative: 10 %
Neutro Abs: 5.7 10*3/uL (ref 1.7–7.7)
Neutrophils Relative %: 77 %
Platelets: 224 10*3/uL (ref 150–400)
RBC: 3.09 MIL/uL — ABNORMAL LOW (ref 3.87–5.11)
RDW: 16.2 % — ABNORMAL HIGH (ref 11.5–15.5)
WBC: 7.5 10*3/uL (ref 4.0–10.5)

## 2017-12-19 LAB — IRON AND TIBC
Iron: 67 ug/dL (ref 28–170)
Saturation Ratios: 23 % (ref 10.4–31.8)
TIBC: 297 ug/dL (ref 250–450)
UIBC: 230 ug/dL

## 2017-12-19 LAB — FERRITIN: Ferritin: 937 ng/mL — ABNORMAL HIGH (ref 11–307)

## 2017-12-19 MED ORDER — EPOETIN ALFA 40000 UNIT/ML IJ SOLN
INTRAMUSCULAR | Status: AC
  Filled 2017-12-19: qty 1

## 2017-12-19 MED ORDER — EPOETIN ALFA 40000 UNIT/ML IJ SOLN
40000.0000 [IU] | Freq: Once | INTRAMUSCULAR | Status: AC
  Administered 2017-12-19: 40000 [IU] via SUBCUTANEOUS

## 2017-12-19 NOTE — Progress Notes (Signed)
[name] [name] presents today for injection per the provider's orders.  Procrit administration without incident; see MAR for injection details.  Patient tolerated procedure well and without incident.  No questions or complaints noted at this time.  Discharged via wheelchair.

## 2017-12-20 DIAGNOSIS — I130 Hypertensive heart and chronic kidney disease with heart failure and stage 1 through stage 4 chronic kidney disease, or unspecified chronic kidney disease: Secondary | ICD-10-CM | POA: Diagnosis not present

## 2017-12-20 DIAGNOSIS — I5023 Acute on chronic systolic (congestive) heart failure: Secondary | ICD-10-CM | POA: Diagnosis not present

## 2017-12-20 DIAGNOSIS — D6832 Hemorrhagic disorder due to extrinsic circulating anticoagulants: Secondary | ICD-10-CM | POA: Diagnosis not present

## 2017-12-20 DIAGNOSIS — J441 Chronic obstructive pulmonary disease with (acute) exacerbation: Secondary | ICD-10-CM | POA: Diagnosis not present

## 2017-12-20 DIAGNOSIS — M5136 Other intervertebral disc degeneration, lumbar region: Secondary | ICD-10-CM | POA: Diagnosis not present

## 2017-12-20 DIAGNOSIS — N184 Chronic kidney disease, stage 4 (severe): Secondary | ICD-10-CM | POA: Diagnosis not present

## 2017-12-21 NOTE — Telephone Encounter (Signed)
Pls check with the supplier , if you are not able to veriify the info from her record

## 2017-12-21 NOTE — Telephone Encounter (Signed)
Did you prescribe her oxygen or was it Luan Pulling?

## 2017-12-24 ENCOUNTER — Ambulatory Visit (INDEPENDENT_AMBULATORY_CARE_PROVIDER_SITE_OTHER): Payer: Medicare Other | Admitting: Family Medicine

## 2017-12-24 VITALS — BP 108/60 | HR 83 | Resp 16 | Ht 67.0 in | Wt 213.0 lb

## 2017-12-24 DIAGNOSIS — Z09 Encounter for follow-up examination after completed treatment for conditions other than malignant neoplasm: Secondary | ICD-10-CM

## 2017-12-24 DIAGNOSIS — I10 Essential (primary) hypertension: Secondary | ICD-10-CM | POA: Diagnosis not present

## 2017-12-24 DIAGNOSIS — I255 Ischemic cardiomyopathy: Secondary | ICD-10-CM

## 2017-12-24 DIAGNOSIS — E890 Postprocedural hypothyroidism: Secondary | ICD-10-CM | POA: Diagnosis not present

## 2017-12-24 NOTE — Progress Notes (Signed)
This encounter was created in error - please disregard.

## 2017-12-24 NOTE — Patient Instructions (Addendum)
Wellness visit with nurse in May/ June, please schedule  F/U with MD in 3.5 months, call if you need me before,we will be in touch with labs for 3 months  Need oxygen for moving around, no smaller tank!  PLEASE stay away form salt and canned foods, limit water and fluid intake and weigh every day  Your weight should be around 212 pounds, no 3 pound weight gain!!!

## 2017-12-25 ENCOUNTER — Other Ambulatory Visit: Payer: Self-pay | Admitting: *Deleted

## 2017-12-25 DIAGNOSIS — M5136 Other intervertebral disc degeneration, lumbar region: Secondary | ICD-10-CM | POA: Diagnosis not present

## 2017-12-25 DIAGNOSIS — D6832 Hemorrhagic disorder due to extrinsic circulating anticoagulants: Secondary | ICD-10-CM | POA: Diagnosis not present

## 2017-12-25 DIAGNOSIS — I130 Hypertensive heart and chronic kidney disease with heart failure and stage 1 through stage 4 chronic kidney disease, or unspecified chronic kidney disease: Secondary | ICD-10-CM | POA: Diagnosis not present

## 2017-12-25 DIAGNOSIS — I5023 Acute on chronic systolic (congestive) heart failure: Secondary | ICD-10-CM | POA: Diagnosis not present

## 2017-12-25 DIAGNOSIS — N184 Chronic kidney disease, stage 4 (severe): Secondary | ICD-10-CM | POA: Diagnosis not present

## 2017-12-25 DIAGNOSIS — J441 Chronic obstructive pulmonary disease with (acute) exacerbation: Secondary | ICD-10-CM | POA: Diagnosis not present

## 2017-12-26 ENCOUNTER — Ambulatory Visit (INDEPENDENT_AMBULATORY_CARE_PROVIDER_SITE_OTHER): Payer: Medicare Other | Admitting: *Deleted

## 2017-12-26 DIAGNOSIS — D649 Anemia, unspecified: Secondary | ICD-10-CM | POA: Diagnosis not present

## 2017-12-26 DIAGNOSIS — N183 Chronic kidney disease, stage 3 (moderate): Secondary | ICD-10-CM | POA: Diagnosis not present

## 2017-12-26 DIAGNOSIS — I255 Ischemic cardiomyopathy: Secondary | ICD-10-CM

## 2017-12-26 DIAGNOSIS — Z952 Presence of prosthetic heart valve: Secondary | ICD-10-CM

## 2017-12-26 DIAGNOSIS — D6832 Hemorrhagic disorder due to extrinsic circulating anticoagulants: Secondary | ICD-10-CM | POA: Diagnosis not present

## 2017-12-26 DIAGNOSIS — Z79899 Other long term (current) drug therapy: Secondary | ICD-10-CM | POA: Diagnosis not present

## 2017-12-26 DIAGNOSIS — Z95810 Presence of automatic (implantable) cardiac defibrillator: Secondary | ICD-10-CM | POA: Diagnosis not present

## 2017-12-26 DIAGNOSIS — M5136 Other intervertebral disc degeneration, lumbar region: Secondary | ICD-10-CM | POA: Diagnosis not present

## 2017-12-26 DIAGNOSIS — I5023 Acute on chronic systolic (congestive) heart failure: Secondary | ICD-10-CM | POA: Diagnosis not present

## 2017-12-26 DIAGNOSIS — I130 Hypertensive heart and chronic kidney disease with heart failure and stage 1 through stage 4 chronic kidney disease, or unspecified chronic kidney disease: Secondary | ICD-10-CM | POA: Diagnosis not present

## 2017-12-26 DIAGNOSIS — Z5181 Encounter for therapeutic drug level monitoring: Secondary | ICD-10-CM

## 2017-12-26 DIAGNOSIS — N184 Chronic kidney disease, stage 4 (severe): Secondary | ICD-10-CM | POA: Diagnosis not present

## 2017-12-26 DIAGNOSIS — I5022 Chronic systolic (congestive) heart failure: Secondary | ICD-10-CM

## 2017-12-26 DIAGNOSIS — J441 Chronic obstructive pulmonary disease with (acute) exacerbation: Secondary | ICD-10-CM | POA: Diagnosis not present

## 2017-12-26 DIAGNOSIS — I10 Essential (primary) hypertension: Secondary | ICD-10-CM | POA: Diagnosis not present

## 2017-12-26 LAB — POCT INR: INR: 1.9

## 2017-12-26 NOTE — Progress Notes (Signed)
Remote ICD transmission.   

## 2017-12-26 NOTE — Patient Instructions (Signed)
Continue coumadin 2 tablets daily except 1 tablet on Tuesdays, Thursdays and Saturdays.  Recheck in 3 weeks.

## 2017-12-27 ENCOUNTER — Encounter: Payer: Self-pay | Admitting: Student

## 2017-12-27 ENCOUNTER — Ambulatory Visit (INDEPENDENT_AMBULATORY_CARE_PROVIDER_SITE_OTHER): Payer: Medicare Other | Admitting: Student

## 2017-12-27 ENCOUNTER — Encounter: Payer: Self-pay | Admitting: Cardiology

## 2017-12-27 VITALS — BP 110/54 | HR 79 | Ht 67.0 in | Wt 216.0 lb

## 2017-12-27 DIAGNOSIS — N184 Chronic kidney disease, stage 4 (severe): Secondary | ICD-10-CM

## 2017-12-27 DIAGNOSIS — I480 Paroxysmal atrial fibrillation: Secondary | ICD-10-CM | POA: Diagnosis not present

## 2017-12-27 DIAGNOSIS — I5042 Chronic combined systolic (congestive) and diastolic (congestive) heart failure: Secondary | ICD-10-CM | POA: Diagnosis not present

## 2017-12-27 DIAGNOSIS — Z952 Presence of prosthetic heart valve: Secondary | ICD-10-CM

## 2017-12-27 DIAGNOSIS — I442 Atrioventricular block, complete: Secondary | ICD-10-CM

## 2017-12-27 DIAGNOSIS — I10 Essential (primary) hypertension: Secondary | ICD-10-CM

## 2017-12-27 DIAGNOSIS — I255 Ischemic cardiomyopathy: Secondary | ICD-10-CM

## 2017-12-27 DIAGNOSIS — E7800 Pure hypercholesterolemia, unspecified: Secondary | ICD-10-CM

## 2017-12-27 NOTE — Progress Notes (Signed)
Cardiology Office Note    Date:  12/27/2017   ID:  [name] [name], DOB 12-May-1932, MRN [id number]  PCP:  Fayrene Helper, MD  Cardiologist: Sanda Klein, MD   EP: Dr. Lovena Le  Chief Complaint  Patient presents with  . Hospitalization Follow-up    History of Present Illness:    [name] [name] is a 83 y.o. female with past medical history of chronic combined systolic and diastolic CHF, paroxysmal atrial fibrillation, CHB (s/p Medtronic CRTD), mechanical mitral (2004) and aortic valve (2001) prosthesis, HTN, HLD, and Stage 3 CKD who presents to the office today for hospital follow-up.  She was last examined by Dr. Sallyanne Kuster on 10/02/2017 and was overall doing well at that time. She was continued on her current medication regimen including Coumadin for anticoagulation.  In the interim, she was admitted to [facility] from 12/09/2017 to 12/11/2017 for evaluation of worsening lower extremity edema and dyspnea on exertion. BNP was found to be elevated to 306 and CXR showed cardiomegaly with no edema or consolidation. She was admitted for an acute CHF exacerbation and diuresed well with IV Lasix. D-dimer was elevated but VQ scan was negative for pulmonary embolism.  Weight at the time of discharge was 213 lbs and she was placed on PTA Lasix 40 mg daily.   In talking with the patient today, she reports overall doing well since recent hospital discharge. She was placed on 2L nasal cannula at the time of discharge and is very anxious to get rid of supplemental oxygen as she reports she develops fatigue when trying to carry her oxygen tank when out of the house. Has been working with home health PT and says oxygen saturations have remained greater than 98%.  Denies any recent orthopnea, PND, lower extremity edema, chest pain, or palpitations.  She remains on Coumadin for anticoagulation and denies any evidence of active bleeding.  Past Medical History:  Diagnosis Date  . Adenomatous polyp of colon  12/16/2002   Dr. [name]/[facility]  . Allergy   . Cataract   . CHB (complete heart block) ([place]) 10/07/2014      . CHF (congestive heart failure) ([place])    a.  EF previously reduced to 25-30% b. EF improved to 60-65% by echo in 10/2017.   Marland Kitchen Chronic kidney disease, stage I 2006  . Constipation       . COPD (chronic obstructive pulmonary disease) ([place])       . DJD (degenerative joint disease) of lumbar spine   . DM (diabetes mellitus) ([place]) 2006  . Esophageal reflux   . Glaucoma   . Gout       . H/O heart valve replacement with mechanical valve    a. s/p mechanical AVR in 2001 and mechanical MVR in 2004.  Marland Kitchen Hyperlipemia   . IDA (iron deficiency anemia)    Parenteral iron/[name]  . Insomnia       . Non-ischemic cardiomyopathy ([place])    a. s/p MDT CRTD  . Obesity, unspecified   . Other and unspecified hyperlipidemia   . Oxygen deficiency 2014   nocturnal;  . Spondylolisthesis   . Spondylosis   . Thyroid cancer ([place])    remote thyroidectomy, no recurrence, pt denies in 2016 thyroid cancer  . Unspecified hypothyroidism       . 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 Vertigo         Past Surgical History:  Procedure Laterality Date  .  AORTIC AND MITRAL VALVE REPLACEMENT  2001  . BI-VENTRICULAR IMPLANTABLE CARDIOVERTER DEFIBRILLATOR UPGRADE N/A 01/18/2015   Procedure: BI-VENTRICULAR IMPLANTABLE CARDIOVERTER DEFIBRILLATOR UPGRADE;  Surgeon: Evans Lance, MD;  Location: [facility] CATH LAB;  Service: Cardiovascular;  Laterality: N/A;  . CARDIAC CATHETERIZATION    . CARDIAC VALVE REPLACEMENT    . CARDIOVERSION N/A 11/13/2016   Procedure: CARDIOVERSION;  Surgeon: Sanda Klein, MD;  Location: [facility] ENDOSCOPY;  Service: Cardiovascular;  Laterality: N/A;  . COLONOSCOPY  06/12/2007   Rourk- Normal rectum/Normal colon  . COLONOSCOPY  12/16/02   small hemorrhoids  . COLONOSCOPY  06/20/2012   Procedure: COLONOSCOPY;  Surgeon:  Daneil Dolin, MD;  Location: [facility] ENDO SUITE;  Service: Endoscopy;  Laterality: N/A;  10:45  . defibrillator implanted 2006    . DOPPLER ECHOCARDIOGRAPHY  2012  . DOPPLER ECHOCARDIOGRAPHY  05,06,07,08,09,2011  . ESOPHAGOGASTRODUODENOSCOPY  06/12/2007   Rourk- normal esophagus, small hiatal hernia, otherwise normal stomach, D1, D2  . EYE SURGERY Right 2005  . EYE SURGERY Left 2006  . ICD LEAD REMOVAL Left 02/08/2015   Procedure: ICD LEAD REMOVAL;  Surgeon: Evans Lance, MD;  Location[facility] OR;  Service: Cardiovascular;  Laterality: Left;  "Will plan extraction and insertion of a BiV PM"  **Dr. Roxan Hockey backing up case**  . IMPLANTABLE CARDIOVERTER DEFIBRILLATOR (ICD) GENERATOR CHANGE Left 02/08/2015   Procedure: ICD GENERATOR CHANGE;  Surgeon: Evans Lance, MD;  Location: [place];  Service: Cardiovascular;  Laterality: Left;  . INSERT / REPLACE / REMOVE PACEMAKER    . PACEMAKER INSERTION  May 2016  . pacemaker placed  2004  . right breast cyst removed     benign   . right cataract removed     2005  . TEE WITHOUT CARDIOVERSION N/A 11/13/2016   Procedure: TRANSESOPHAGEAL ECHOCARDIOGRAM (TEE);  Surgeon: Sanda Klein, MD;  Location: [facility] ENDOSCOPY;  Service: Cardiovascular;  Laterality: N/A;  . THYROIDECTOMY      Current Medications: Outpatient Medications Prior to Visit  Medication Sig Dispense Refill  . acetaminophen (TYLENOL) 500 MG tablet Take 500 mg by mouth every 6 (six) hours as needed (pain).    Marland Kitchen albuterol (PROVENTIL) (2.5 MG/3ML) 0.083% nebulizer solution INHALE 1 VIAL VIA NEBULIZER THREE TIMES DAILY. (Patient taking differently: INHALE 1 VIAL VIA NEBULIZER THREE TIMES DAILY AS NEEDED) 90 vial 5  . albuterol (VENTOLIN HFA) 108 (90 Base) MCG/ACT inhaler Inhale 2 puffs into the lungs every 6 (six) hours as needed for wheezing or shortness of breath. 3 Inhaler 3  . allopurinol (ZYLOPRIM) 100 MG tablet Take 1 tablet (100 mg total) by mouth daily. 90 tablet 1  . azelastine (ASTELIN)  0.1 % nasal spray Place 2 sprays into both nostrils daily as needed for allergies. Use in each nostril as directed    . BREO ELLIPTA 100-25 MCG/INH AEPB Inhale 1 puff into the lungs daily.   1  . calcium carbonate (OS-CAL) 1250 (500 Ca) MG chewable tablet Chew 1 tablet by mouth daily.    . cholecalciferol (VITAMIN D) 1000 UNITS tablet Take 1,000 Units by mouth daily.    Marland Kitchen COUMADIN 5 MG tablet Take 2 tablets daily except 1 tablet on Tuesdays (Patient taking differently: Take 1 tablet Tuesday, Thursday, and Saturday and  Take 2 tablets all other days.) 60 tablet 6  . Darbepoetin Alfa (ARANESP, ALBUMIN FREE, IJ) EVERY THURSDAY    . digoxin (LANOXIN) 0.125 MG tablet Take 1 tablet (0.125 mg total) by mouth every other day. 45 tablet 3  .  docusate sodium (COLACE) 100 MG capsule Take 300 mg by mouth at bedtime.    . fluticasone (FLONASE) 50 MCG/ACT nasal spray Place 2 sprays into both nostrils daily. Pt needs now 48 g 1  . folic acid (FOLVITE) 1 MG tablet Take 1 tablet (1 mg total) by mouth daily. 100 tablet 3  . furosemide (LASIX) 40 MG tablet Take 1 tablet by mouth  daily 90 tablet 1  . gabapentin (NEURONTIN) 300 MG capsule Take 1 capsule (300 mg total) by mouth at bedtime. 90 capsule 1  . guaiFENesin (MUCINEX) 600 MG 12 hr tablet Take 1 tablet (600 mg total) by mouth 2 (two) times daily. 60 tablet 2  . hydrALAZINE (APRESOLINE) 25 MG tablet Take 1 tablet (25 mg total) by mouth 3 (three) times daily. 270 tablet 3  . HYDROcodone-acetaminophen (NORCO) 7.5-325 MG tablet Take 1 tablet by mouth every 6 (six) hours as needed for moderate pain (Must last 30 days.). 70 tablet 0  . isosorbide mononitrate (IMDUR) 60 MG 24 hr tablet Take 1 tablet (60 mg total) by mouth daily. 90 tablet 1  . meclizine (ANTIVERT) 25 MG tablet Take 1 tablet (25 mg total) by mouth 3 (three) times daily as needed for dizziness. 30 tablet 0  . metolazone (ZAROXOLYN) 2.5 MG tablet TAKE 1 TABLET ONCE A WEEK (Patient taking differently:  Take 2.5 mg by mouth. TAKE 1 TABLET ONCE A WEEK (Wednesday)) 12 tablet 2  . metoprolol succinate (TOPROL XL) 100 MG 24 hr tablet Take 1 tablet (100 mg total) by mouth daily. Take with or immediately following a meal. 30 tablet 0  . Multiple Vitamin (MULTIVITAMIN) LIQD Take 5 mLs by mouth daily.    . ondansetron (ZOFRAN) 4 MG tablet Take 1 tablet (4 mg total) by mouth daily as needed for nausea or vomiting. 90 tablet 0  . Polyethyl [name] (SYSTANE OP) Place 1 drop into both eyes daily as needed (dry eyes).    . potassium chloride SA (KLOR-CON M20) 20 MEQ tablet Take 1 tablet by mouth  daily 90 tablet 1  . pravastatin (PRAVACHOL) 40 MG tablet Take 1 tablet (40 mg total) by mouth every evening. 90 tablet 3  . SYNTHROID 75 MCG tablet Take 1 tablet by mouth  daily 90 tablet 1   Facility-Administered Medications Prior to Visit  Medication Dose Route Frequency Provider Last Rate Last Dose  . epoetin alfa (EPOGEN,PROCRIT) injection 40,000 Units  40,000 Units Subcutaneous Once Farrel Gobble, MD         Allergies:   Penicillins; Aspirin; Fentanyl; and Niacin   Social History   Socioeconomic History  . Marital status: Widowed    Spouse name: Not on file  . Number of children: 0  . Years of education: Not on file  . Highest education level: Not on file  Occupational History  . Occupation: retired    Fish farm manager: RETIRED  Social Needs  . Financial resource strain: Not on file  . Food insecurity:    Worry: Not on file    Inability: Not on file  . Transportation needs:    Medical: Not on file    Non-medical: Not on file  Tobacco Use  . Smoking status: Former Smoker    Packs/day: 0.75    Years: 40.00    Pack years: 30.00    Types: Cigarettes    Last attempt to quit: 03/08/1987    Years since quitting: 30.8  . Smokeless tobacco: Never Used  Substance and Sexual Activity  .  Alcohol use: No    Alcohol/week: 0.0 oz  . Drug use: No  . Sexual activity: Not Currently    Lifestyle  . Physical activity:    Days per week: Not on file    Minutes per session: Not on file  . Stress: Not on file  Relationships  . Social connections:    Talks on phone: Not on file    Gets together: Not on file    Attends religious service: Not on file    Active member of club or organization: Not on file    Attends meetings of clubs or organizations: Not on file    Relationship status: Not on file  Other Topics Concern  . Not on file  Social History Narrative   From [place] (2004)     Family History:  The patient's family history includes Diabetes in her brother and brother; Heart attack in her sister; Heart disease in her brother, brother, father, and sister; Hypertension in her brother; Lung cancer in her brother; Pancreatic cancer in her mother.   Review of Systems:   Please see the history of present illness.     General:  No chills, fever, night sweats or weight changes. Positive for fatigue.  Cardiovascular:  No chest pain, dyspnea on exertion, edema, orthopnea, palpitations, paroxysmal nocturnal dyspnea. Dermatological: No rash, lesions/masses Respiratory: No cough, dyspnea Urologic: No hematuria, dysuria Abdominal:   No nausea, vomiting, diarrhea, bright red blood per rectum, melena, or hematemesis Neurologic:  No visual changes, wkns, changes in mental status. All other systems reviewed and are otherwise negative except as noted above.   Physical Exam:    VS:  BP (!) 110/54   Pulse 79   Ht 5\' 7"  (1.702 m)   Wt 216 lb (98 kg)   SpO2 93%   BMI 33.83 kg/m    General: Well developed, well nourished [place] American female appearing in no acute distress. Head: Normocephalic, atraumatic, sclera non-icteric, no xanthomas, nares are without discharge.  Neck: No carotid bruits. JVD not elevated.  Lungs: Respirations regular and unlabored, without wheezes or rales. On 2L [place].  Heart: Regular rate and rhythm. No S3 or S4.  Crisp mechanical valve sounds  appreciated.  Abdomen: Soft, non-tender, non-distended with normoactive bowel sounds. No hepatomegaly. No rebound/guarding. No obvious abdominal masses. Msk:  Strength and tone appear normal for age. No joint deformities or effusions. Extremities: No clubbing or cyanosis. No lower extremity edema.  Distal pedal pulses are 2+ bilaterally. Neuro: Alert and oriented X 3. Moves all extremities spontaneously. No focal deficits noted. Psych:  Responds to questions appropriately with a normal affect. Skin: No rashes or lesions noted  Wt Readings from Last 3 Encounters:  12/27/17 216 lb (98 kg)  12/24/17 213 lb (96.6 kg)  12/18/17 212 lb (96.2 kg)     Studies/Labs Reviewed:   EKG:  EKG is not ordered today.   Recent Labs: 11/07/2017: ALT 17 12/09/2017: B Natriuretic Peptide 306.0; Magnesium 1.7; TSH 0.817 12/11/2017: BUN 44; Creatinine, Ser 1.89; Potassium 4.2; Sodium 139 12/19/2017: Hemoglobin 9.4; Platelets 224   Lipid Panel    Component Value Date/Time   CHOL 193 06/28/2017 1008   CHOL 165 10/16/2016 1213   TRIG 254 (H) 06/28/2017 1008   HDL 38 (L) 06/28/2017 1008   HDL 31 (L) 10/16/2016 1213   CHOLHDL 5.1 (H) 06/28/2017 1008   VLDL 52 (H) 02/21/2017 1010   LDLCALC 119 (H) 06/28/2017 1008    Additional studies/ records that were reviewed  today include:   Echocardiogram: 11/16/2017 Study Conclusions  - Left ventricle: The cavity size was normal. Wall thickness was   increased in a pattern of moderate LVH. Systolic function was   normal. The estimated ejection fraction was in the range of 60%   to 65%. The study is not technically sufficient to allow   evaluation of LV diastolic function. - Aortic valve: A [employer] mechanical prosthesis was   present. Mean gradient (S): 14 mm Hg. Peak gradient (S): 30 mm   Hg. VTI ratio of LVOT to aortic valve: 0.31. - Mitral valve: A [employer] mechanical prosthesis was   present. There was trivial regurgitation. Mean gradient  (D): 4 mm   Hg. - Left atrium: The atrium was mildly to moderately dilated. - Right ventricle: Pacer wire or catheter noted in right ventricle. - Tricuspid valve: There was trivial regurgitation. - Pulmonary arteries: PA peak pressure: 42 mm Hg (S). - Pericardium, extracardiac: There was no pericardial effusion.  Impressions:  - LVEF has improved significantly since prior study from February   2018. Gradients suggest adequate aortic and mitral prosthetic   function.  Assessment:    1. Chronic combined systolic and diastolic heart failure (HCC)   2. Paroxysmal atrial fibrillation ([place])   3. CHB (complete heart block) (HCC)   4. S/P MVR (mitral valve replacement)   5. S/P AVR (aortic valve replacement)   6. Essential hypertension   7. Hypercholesterolemia   8. CKD (chronic kidney disease), stage IV ([place])      Plan:   In order of problems listed above:  1. Chronic combined systolic and diastolic CHF - EF previously reduced to 25-30%, improved to 60-65% by echo in 10/2017. Recently admitted for an acute CHF exacerbation and diuresed well with a discharge weight of 213 lbs. - reports doing well since hospital discharge and denies any recent orthopnea, PND, or lower extremity edema. Remains on 2L [place] at today's visit. Reports oxygen saturations have been stable when checked at home. I recommended she have ambulatory oxygen saturations checked with her next home health PT visit. If saturations remain greater than 92%, can reduce to 1 L [place] and continue to wean as tolerated. -Will continue Lasix 40 mg daily as weight has been stable on her home scales. Creatinine followed by Nephrology.  - remains on BB, Hydralazine, and Imdur.   2. Paroxysmal atrial fibrillation - Maintaining normal sinus rhythm by examination today. She remains on Digoxin 0.125 mg every other day and Toprol-XL 100 mg daily. If renal function continues to trend upwards, would recommend stopping Digoxin. - Continue  Coumadin for anticoagulation.  3. CHB  - s/p Medtronic CRT-D. - followed by Dr. Lovena Le. Recent device interrogation in 09/2017 showed the device was functioning normally.   4. Mechanical mitral and aortic valves - s/p mechanical AVR in 2001 and mechanical MVR in 2004. - remains on Coumadin for anticoagulation. Followed by [facility]. Denies any evidence of active bleeding.   5. HTN - BP is well controlled at 110/54 during today's visit. - Continue Hydralazine 25 mg TID, Imdur 60 mg daily, and Toprol-XL 100 mg daily.  6. HLD - Followed by PCP.  Remains on pravastatin 40 mg daily.  7. Stage 3-4 CKD - baseline creatinine 1.7 - 1.8, peaked at 2.44 during recent admission but improved to 1.89 on 12/11/2017. - followed by Dr. Lowanda Foster. Scheduled for repeat labs and follow-up next week by her report.    Medication Adjustments/Labs and Tests  Ordered: Current medicines are reviewed at length with the patient today.  Concerns regarding medicines are outlined above.  Medication changes, Labs and Tests ordered today are listed in the Patient Instructions below.  When PT comes to your home again, IF O2 Sat is GREATER THAN 92 %, REDUCE oxygen to 1 liter via nasal cannula  Your physician recommends that you continue on your current medications as directed. Please refer to the Current Medication list given to you today.  If you need a refill on your cardiac medications before your next appointment, please call your pharmacy.  No lab work or tests today.  Keep follow up with Dr.Croitoru in July as planned  Thank you for choosing [facility] !         Signed, Erma Heritage, PA-C  12/27/2017 4:37 PM    [facility] [address] [place], [place] [address] Phone: [phone]

## 2017-12-27 NOTE — Patient Instructions (Addendum)
DASH Eating Plan DASH stands for "Dietary Approaches to Stop Hypertension." The DASH eating plan is a healthy eating plan that has been shown to reduce high blood pressure (hypertension). It may also reduce your risk for type 2 diabetes, heart disease, and stroke. The DASH eating plan may also help with weight loss. What are tips for following this plan? General guidelines  Avoid eating more than 2,300 mg (milligrams) of salt (sodium) a day. If you have hypertension, you may need to reduce your sodium intake to 1,500 mg a day.  Limit alcohol intake to no more than 1 drink a day for nonpregnant women and 2 drinks a day for men. One drink equals 12 oz of beer, 5 oz of wine, or 1 oz of hard liquor.  Work with your health care provider to maintain a healthy body weight or to lose weight. Ask what an ideal weight is for you.  Get at least 30 minutes of exercise that causes your heart to beat faster (aerobic exercise) most days of the week. Activities may include walking, swimming, or biking.  Work with your health care provider or diet and nutrition specialist (dietitian) to adjust your eating plan to your individual calorie needs. Reading food labels  Check food labels for the amount of sodium per serving. Choose foods with less than 5 percent of the Daily Value of sodium. Generally, foods with less than 300 mg of sodium per serving fit into this eating plan.  To find whole grains, look for the word "whole" as the first word in the ingredient list. Shopping  Buy products labeled as "low-sodium" or "no salt added."  Buy fresh foods. Avoid canned foods and premade or frozen meals. Cooking  Avoid adding salt when cooking. Use salt-free seasonings or herbs instead of table salt or sea salt. Check with your health care provider or pharmacist before using salt substitutes.  Do not fry foods. Cook foods using healthy methods such as baking, boiling, grilling, and broiling instead.  Cook with  heart-healthy oils, such as olive, canola, soybean, or sunflower oil. Meal planning   Eat a balanced diet that includes: ? 5 or more servings of fruits and vegetables each day. At each meal, try to fill half of your plate with fruits and vegetables. ? Up to 6-8 servings of whole grains each day. ? Less than 6 oz of lean meat, poultry, or fish each day. A 3-oz serving of meat is about the same size as a deck of cards. One egg equals 1 oz. ? 2 servings of low-fat dairy each day. ? A serving of nuts, seeds, or beans 5 times each week. ? Heart-healthy fats. Healthy fats called Omega-3 fatty acids are found in foods such as flaxseeds and coldwater fish, like sardines, salmon, and mackerel.  Limit how much you eat of the following: ? Canned or prepackaged foods. ? Food that is high in trans fat, such as fried foods. ? Food that is high in saturated fat, such as fatty meat. ? Sweets, desserts, sugary drinks, and other foods with added sugar. ? Full-fat dairy products.  Do not salt foods before eating.  Try to eat at least 2 vegetarian meals each week.  Eat more home-cooked food and less restaurant, buffet, and fast food.  When eating at a restaurant, ask that your food be prepared with less salt or no salt, if possible. What foods are recommended? The items listed may not be a complete list. Talk with your dietitian about what   dietary choices are best for you. Grains Whole-grain or whole-wheat bread. Whole-grain or whole-wheat pasta. Brown rice. Oatmeal. Quinoa. Bulgur. Whole-grain and low-sodium cereals. Pita bread. Low-fat, low-sodium crackers. Whole-wheat flour tortillas. Vegetables Fresh or frozen vegetables (raw, steamed, roasted, or grilled). Low-sodium or reduced-sodium tomato and vegetable juice. Low-sodium or reduced-sodium tomato sauce and tomato paste. Low-sodium or reduced-sodium canned vegetables. Fruits All fresh, dried, or frozen fruit. Canned fruit in natural juice (without  added sugar). Meat and other protein foods Skinless chicken or turkey. Ground chicken or turkey. Pork with fat trimmed off. Fish and seafood. Egg whites. Dried beans, peas, or lentils. Unsalted nuts, nut butters, and seeds. Unsalted canned beans. Lean cuts of beef with fat trimmed off. Low-sodium, lean deli meat. Dairy Low-fat (1%) or fat-free (skim) milk. Fat-free, low-fat, or reduced-fat cheeses. Nonfat, low-sodium ricotta or cottage cheese. Low-fat or nonfat yogurt. Low-fat, low-sodium cheese. Fats and oils Soft margarine without trans fats. Vegetable oil. Low-fat, reduced-fat, or light mayonnaise and salad dressings (reduced-sodium). Canola, safflower, olive, soybean, and sunflower oils. Avocado. Seasoning and other foods Herbs. Spices. Seasoning mixes without salt. Unsalted popcorn and pretzels. Fat-free sweets. What foods are not recommended? The items listed may not be a complete list. Talk with your dietitian about what dietary choices are best for you. Grains Baked goods made with fat, such as croissants, muffins, or some breads. Dry pasta or rice meal packs. Vegetables Creamed or fried vegetables. Vegetables in a cheese sauce. Regular canned vegetables (not low-sodium or reduced-sodium). Regular canned tomato sauce and paste (not low-sodium or reduced-sodium). Regular tomato and vegetable juice (not low-sodium or reduced-sodium). Pickles. Olives. Fruits Canned fruit in a light or heavy syrup. Fried fruit. Fruit in cream or butter sauce. Meat and other protein foods Fatty cuts of meat. Ribs. Fried meat. Bacon. Sausage. Bologna and other processed lunch meats. Salami. Fatback. Hotdogs. Bratwurst. Salted nuts and seeds. Canned beans with added salt. Canned or smoked fish. Whole eggs or egg yolks. Chicken or turkey with skin. Dairy Whole or 2% milk, cream, and half-and-half. Whole or full-fat cream cheese. Whole-fat or sweetened yogurt. Full-fat cheese. Nondairy creamers. Whipped toppings.  Processed cheese and cheese spreads. Fats and oils Butter. Stick margarine. Lard. Shortening. Ghee. Bacon fat. Tropical oils, such as coconut, palm kernel, or palm oil. Seasoning and other foods Salted popcorn and pretzels. Onion salt, garlic salt, seasoned salt, table salt, and sea salt. Worcestershire sauce. Tartar sauce. Barbecue sauce. Teriyaki sauce. Soy sauce, including reduced-sodium. Steak sauce. Canned and packaged gravies. Fish sauce. Oyster sauce. Cocktail sauce. Horseradish that you find on the shelf. Ketchup. Mustard. Meat flavorings and tenderizers. Bouillon cubes. Hot sauce and Tabasco sauce. Premade or packaged marinades. Premade or packaged taco seasonings. Relishes. Regular salad dressings. Where to find more information:  National Heart, Lung, and Blood Institute: www.nhlbi.nih.gov  American Heart Association: www.heart.org Summary  The DASH eating plan is a healthy eating plan that has been shown to reduce high blood pressure (hypertension). It may also reduce your risk for type 2 diabetes, heart disease, and stroke.  With the DASH eating plan, you should limit salt (sodium) intake to 2,300 mg a day. If you have hypertension, you may need to reduce your sodium intake to 1,500 mg a day.  When on the DASH eating plan, aim to eat more fresh fruits and vegetables, whole grains, lean proteins, low-fat dairy, and heart-healthy fats.  Work with your health care provider or diet and nutrition specialist (dietitian) to adjust your eating plan to your individual   calorie needs. This information is not intended to replace advice given to you by your health care provider. Make sure you discuss any questions you have with your health care provider. Document Released: 08/31/2011 Document Revised: 09/04/2016 Document Reviewed: 09/04/2016 Elsevier Interactive Patient Education  2018 [facility] comes to your home again, IF O2 Sat is GREATER THAN 92 %, REDUCE oxygen  to 1 liter via nasal cannula     Your physician recommends that you continue on your current medications as directed. Please refer to the Current Medication list given to you today.     If you need a refill on your cardiac medications before your next appointment, please call your pharmacy.      No lab work or tests today.    Keep follow up with Dr.Croitoru in July as planned      Thank you for choosing [facility] !

## 2017-12-27 NOTE — Progress Notes (Signed)
Thank you new B! MCr (or should I call you UsedtoB?)

## 2017-12-27 NOTE — Telephone Encounter (Signed)
[facility] has already switched her oxygen to a lighter one recently but she still states its too heavy. Spoke with Ashly at [facility] and he is going to check if there are any other options for her

## 2017-12-28 ENCOUNTER — Telehealth: Payer: Self-pay

## 2017-12-28 ENCOUNTER — Encounter: Payer: Self-pay | Admitting: Family Medicine

## 2017-12-28 DIAGNOSIS — J441 Chronic obstructive pulmonary disease with (acute) exacerbation: Secondary | ICD-10-CM | POA: Diagnosis not present

## 2017-12-28 DIAGNOSIS — I5023 Acute on chronic systolic (congestive) heart failure: Secondary | ICD-10-CM | POA: Diagnosis not present

## 2017-12-28 DIAGNOSIS — M5136 Other intervertebral disc degeneration, lumbar region: Secondary | ICD-10-CM | POA: Diagnosis not present

## 2017-12-28 DIAGNOSIS — I130 Hypertensive heart and chronic kidney disease with heart failure and stage 1 through stage 4 chronic kidney disease, or unspecified chronic kidney disease: Secondary | ICD-10-CM | POA: Diagnosis not present

## 2017-12-28 DIAGNOSIS — N184 Chronic kidney disease, stage 4 (severe): Secondary | ICD-10-CM | POA: Diagnosis not present

## 2017-12-28 DIAGNOSIS — D6832 Hemorrhagic disorder due to extrinsic circulating anticoagulants: Secondary | ICD-10-CM | POA: Diagnosis not present

## 2017-12-28 IMAGING — US ULTRASOUND LEFT BREAST LIMITED
1 series · 7 of 7 positions shown · non-contrast
Comparison: 04/27/2015, 03/18/2014, 03/12/2013, 02/28/2012,
02/22/2011, 02/02/2010

CLINICAL DATA: 84-year-old patient presents for follow-up of a
probable intraductal papilloma in the retroareolar left breast. She
is due for annual examination of both breasts. She reports that she
has had no nipple discharge over the past year, since her mammogram
April 2015. She has no lumps or areas of concern in either
breast.

EXAM:
2D DIGITAL DIAGNOSTIC BILATERAL MAMMOGRAM WITH CAD AND ADJUNCT TOMO
ULTRASOUND LEFT BREAST

[Series 1: ultrasound left breast limited · 0.06mm/px · 7 of 7 slices shown]
[im 1/7]
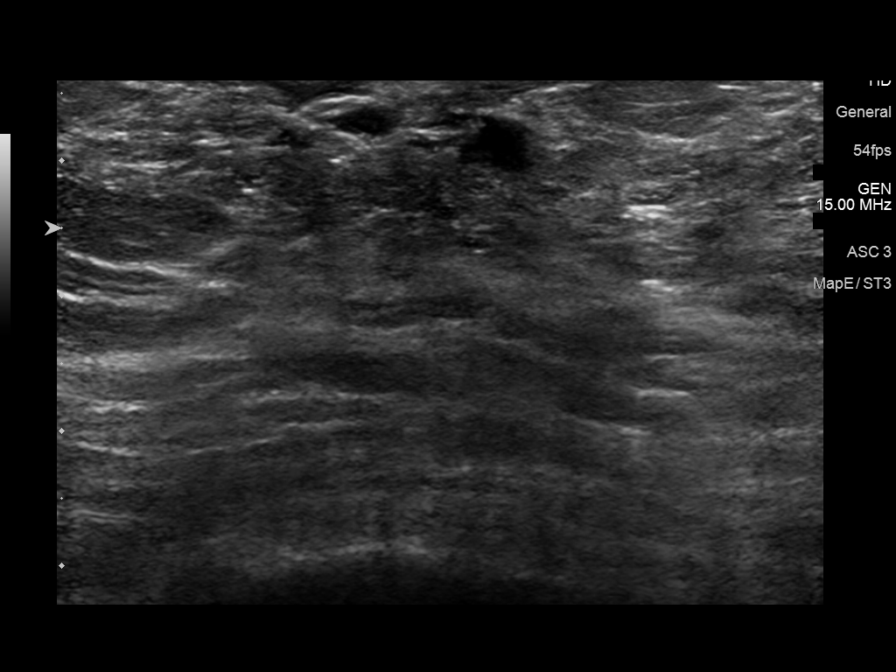
[im 2/7]
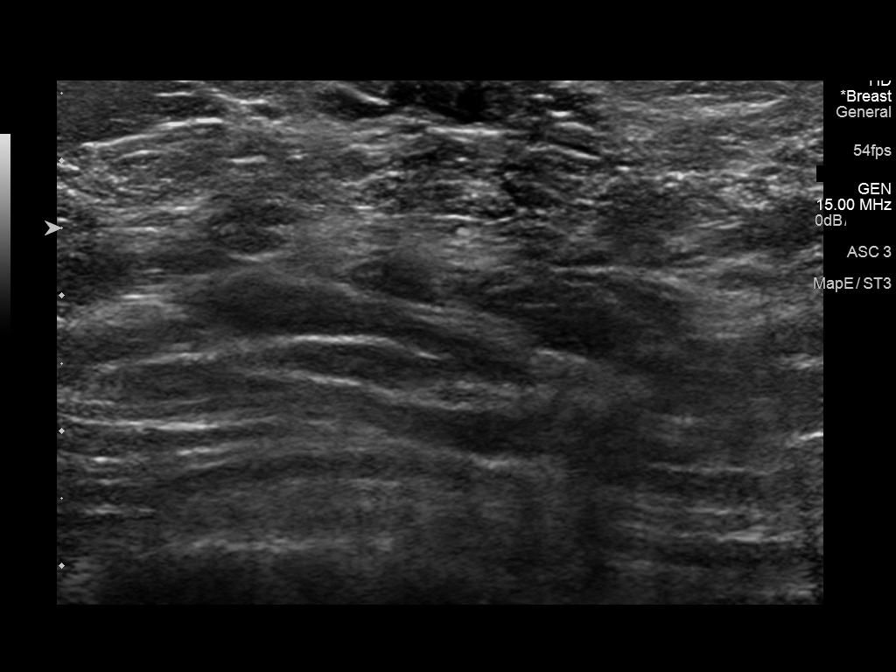
[im 3/7]
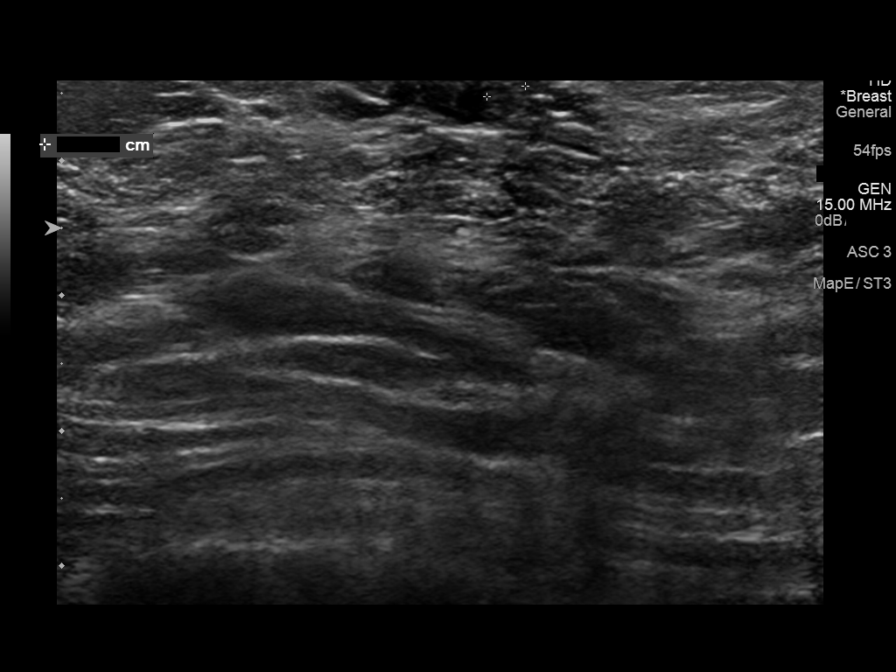
[im 4/7]
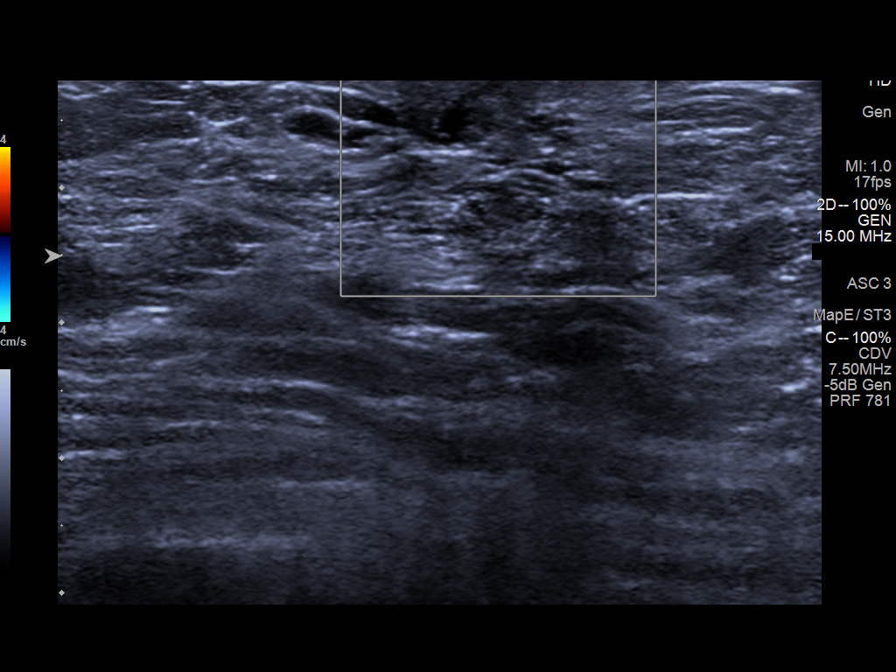
[im 5/7]
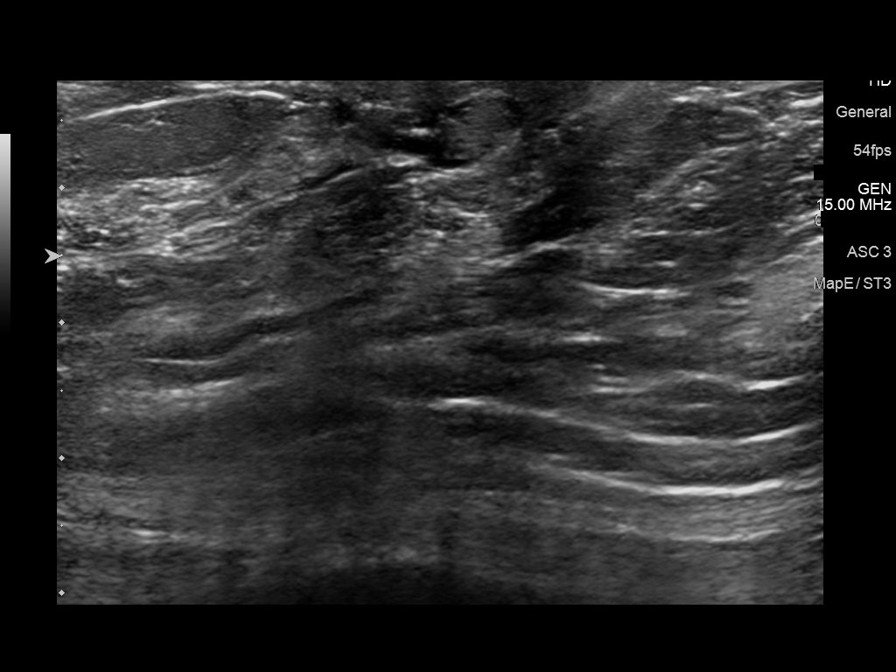
[im 6/7]
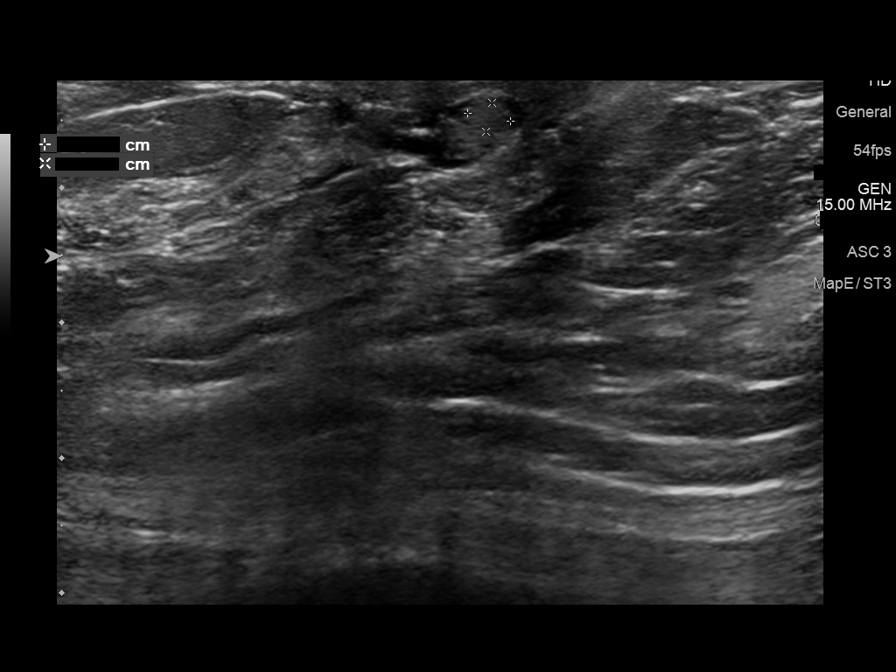
[im 7/7]
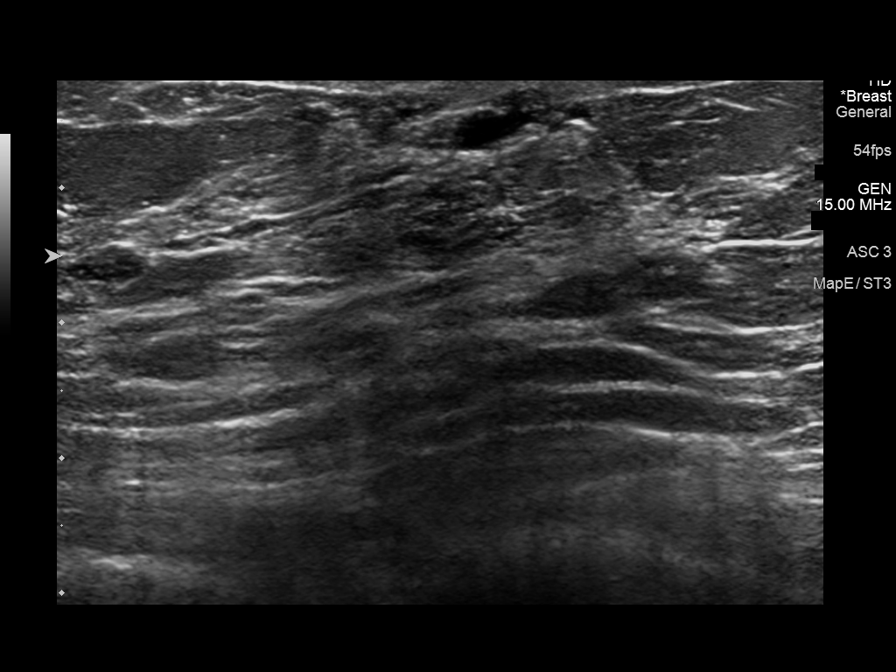

[7 of 7 positions shown; findings below may reference images not displayed]

ACR Breast Density Category c: The breast tissue is heterogeneously
dense, which may obscure small masses.
FINDINGS: Stable parenchymal pattern bilaterally. No new or suspicious mass,
distortion, or suspicious microcalcification. There are multiple
benign secretory type calcifications and calcified degenerating
fibroadenomas bilaterally.

Mammographic images were processed with CAD.

Targeted ultrasound is performed, showing an echogenic intraductal
mass surrounded by fluid in the immediate retroareolar left breast
that measures 5 x 3 x 2 mm. This has imaging features suggestive of
a papilloma, and is stable in size and appearance dating back to an
ultrasound exam of January 2010.
IMPRESSION: Stable 5 mm intraductal retroareolar mass in the left breast with
imaging features suggestive of papilloma. Given stability in size
and appearance dating back to January 2010, this can be considered
benign.

RECOMMENDATION:
Screening mammogram in one year.(Code:UI-[id number])

I have discussed the findings and recommendations with the patient.
Results were also provided in writing at the conclusion of the
visit. If applicable, a reminder letter will be sent to the patient
regarding the next appointment.

BI-RADS CATEGORY  2: Benign.

## 2017-12-28 NOTE — Progress Notes (Signed)
   [name] [name]     MRN: [id number]      DOB: 1931/12/21   HPI [name] is here for follow up of recent hospitalization from 3/17 to 12/11/2017 for acute diastolic heart failure exacerbation. Reports that she now is doing well, denies excessive weight gain and is not using excess salt or canned foods. Requesting smaller oxygen tank and does not feel she needs to use the oxygen at all times esp when sitting quietly with no activity Denies PND, orthopnea or leg swelling ROS See HPI  Denies recent fever or chills. Denies sinus pressure, nasal congestion, ear pain or sore throat. Denies chest congestion, productive cough or wheezing. Denies chest pains, c/o occasionmal palpitations and  ild leg swelling Denies abdominal pain, nausea, vomiting,diarrhea or constipation.   Denies dysuria, frequency, hesitancy or incontinence. C/o chronic joint pain, swelling and limitation in mobility. Denies headaches, seizures, numbness, or tingling. Denies depression, anxiety or insomnia. Denies skin break down or rash.   PE  BP 108/60   Pulse 83   Resp 16   Ht 5\' 7"  (1.702 m)   Wt 213 lb (96.6 kg)   SpO2 94%   BMI 33.36 kg/m   Patient alert and oriented and in no cardiopulmonary distress.  HEENT: No facial asymmetry, EOMI,   oropharynx pink and moist.  Neck decreased ROM no JVD, no mass.  Chest: Clear to auscultation bilaterally.decreased ir entry  CVS: S1, S2 systolic  murmurs, no S3.Regular rate.Click  ABD: Soft non tender.   Ext: No edema  MS: Decreased  ROM spine, shoulders, hips and knees.  Skin: Intact, no ulcerations or rash noted.  Psych: Good eye contact, normal affect. Memory intact not anxious or depressed appearing.  CNS: CN 2-12 intact, power,  normal throughout.no focal deficits noted.   [facility] discharge follow-up Hospital course reviewed with the patient She is re educate re the importance of restricting salt and water intake and the  importance of daily weights to monitor her heart failure  Status . I advised her that with the high number of re  Admissions this year she may be better off in a sNF so she will need to be more careful as far as monitoring her chronic conditions and following dietary and fluid restrictions. States she is not ready to go to nursing home   Hypothyroidism, postsurgical Controlled, no change in medication   Essential hypertension Controlled, no change in medication

## 2017-12-28 NOTE — Assessment & Plan Note (Signed)
Controlled, no change in medication  

## 2017-12-28 NOTE — Progress Notes (Signed)
Thanks MCr 

## 2017-12-28 NOTE — Assessment & Plan Note (Signed)
Hospital course reviewed with the patient She is re educate re the importance of restricting salt and water intake and the importance of daily weights to monitor her heart failure  Status . I advised her that with the high number of re  Admissions this year she may be better off in a sNF so she will need to be more careful as far as monitoring her chronic conditions and following dietary and fluid restrictions. States she is not ready to go to nursing home

## 2017-12-28 NOTE — Progress Notes (Signed)
EPIC Encounter for ICM Monitoring  Patient Name: [name] [name] is a 84 y.o. female Date: 12/28/2017 Primary Care Physican: Fayrene Helper, MD Primary Cardiologist: Croitoru Electrophysiologist: Lovena Le Dry Weight: 211 lbs home weight Bi-V Pacing:  99.6%   Clinical Status (02-Oct-2017 to 26-Dec-2017) Treated VT/VF 0 episodes  AT/AF 2 episodes  Time in AT/AF 3.6 hr/day (15.0%)  Observations (2) (02-Oct-2017 to 26-Dec-2017)  AT/AF >= 6 hr for 13 days.  Patient Activity less than 1 hr/day for 12 weeks.      Heart Failure questions reviewed, pt asymptomatic today.  Hospitalized 3 times in the last 2 months for CHF and last hospitalization 12/09/17 to 12/11/17 which correlates with decreased impedance. Impedance was normal on day of discharge. Discharge weight was 213 lbs. She stated breathing is at her baseline and she is using oxygen.  Denied any leg swelling and she has been weighing at home.     Thoracic impedance abnormal suggesting fluid accumulation which started after hospital discharge on 12/11/17.   Prescribed dosage: Furosemide 40 mg 1 tablet daily.  Metolazaone 2.5 mg Take 1 tablet once a week (Wednesday). Potassium 20 mEq take 1 tablet daily.   Labs: 12/11/2017 Creatinine 1.89, BUN 44, Potassium 4.2, Sodium 139, EGFR 23-27 12/09/2017 Creatinine 1.86, BUN 51, Potassium 4.1, Sodium 143, EGFR 24-28  11/17/2017 Creatinine 2.36, BUN 79, Potassium 4.5, Sodium 138, EGFR 18-21  11/16/2017 Creatinine 2.44, BUN 68, Potassium 4.1, Sodium 136, EGFR 17-20  11/15/2017 Creatinine 2.34, BUN 67, Potassium 5.2, Sodium 136, EGFR 18-21  11/07/2017 Creatinine 2.17, BUN 55, Potassium 4.7, Sodium 135, EGFR 20-23  11/02/2017 Creatinine 1.74, BUN 55, Potassium 4.2, Sodium 139, EGFR 26-30  11/01/2017 Creatinine 1.93, BUN 54, Potassium 3.9, Sodium 137, EGFR 23-27  10/31/2017 Creatinine 2.03, BUN 48, Potassium 4.5, Sodium 142, EGFR 22-25  A complete set of results can be found in Results  Review.  Recommendations: No changes. Call was brief today due to physical therapist was there.  Encouraged to call for fluid symptoms.  Follow-up plan: ICM clinic phone appointment on 01/03/2018 to recheck fluid levels (manual send).    Copy of ICM check sent to Dr. Sallyanne Kuster and Dr. Lovena Le.   3 month ICM trend: 12/26/2017    AT/AF   1 Year ICM trend:       [name], RN 12/28/2017 2:18 PM

## 2017-12-28 NOTE — Telephone Encounter (Signed)
Patient referred to [facility] clinic by Dr Sallyanne Kuster.  Spoke with patient and she agreed to monthly ICM follow up.  See ICM note for 12/26/2017 for review of 12/26/2017 transmission. The call was brief due to hher physical therapist was with her at the time of the call.  She weighs at home and weight today is 211 lbs.  She reported breathing is at her baseline and denied any lower extremity swelling.  Will recheck remote transmission 01/03/2018.

## 2018-01-01 DIAGNOSIS — M5136 Other intervertebral disc degeneration, lumbar region: Secondary | ICD-10-CM | POA: Diagnosis not present

## 2018-01-01 DIAGNOSIS — D6832 Hemorrhagic disorder due to extrinsic circulating anticoagulants: Secondary | ICD-10-CM | POA: Diagnosis not present

## 2018-01-01 DIAGNOSIS — I5023 Acute on chronic systolic (congestive) heart failure: Secondary | ICD-10-CM | POA: Diagnosis not present

## 2018-01-01 DIAGNOSIS — I130 Hypertensive heart and chronic kidney disease with heart failure and stage 1 through stage 4 chronic kidney disease, or unspecified chronic kidney disease: Secondary | ICD-10-CM | POA: Diagnosis not present

## 2018-01-01 DIAGNOSIS — J441 Chronic obstructive pulmonary disease with (acute) exacerbation: Secondary | ICD-10-CM | POA: Diagnosis not present

## 2018-01-01 DIAGNOSIS — N184 Chronic kidney disease, stage 4 (severe): Secondary | ICD-10-CM | POA: Diagnosis not present

## 2018-01-02 ENCOUNTER — Other Ambulatory Visit: Payer: Self-pay | Admitting: *Deleted

## 2018-01-02 NOTE — Patient Outreach (Addendum)
[facility]) Care Management  01/02/2018  Ily Denno March 08, 1932 [id number]   Transition of care completion/EMMI red flags/case closure  Called to follow up with Mrs Menchaca for transition of care completion She reports she is doing well and managing at home Continues with home health services  Denies medical needs  EMMI red flag for not weighing on 12/30/17 &  12/31/17 and for having little interest on 01/01/18  She denies depression or need for counseling. She states she "weigh every day" but sometimes she has not weighed when the [facility] program calls her. She confirms a wt of 313 lbs x 1 but decreased to 208 lbs with medication use on 01/01/18   She reports she will not be home tomorrow eye appointment and if she is not at home she can not respond   Plans case closure pending Discussed case closure  Updated [facility] CMA on EMMI red flag responses    Alhassan Everingham L. Lavina [name], RN, BSN, [place] Coordinator [phone] week day mobile

## 2018-01-03 ENCOUNTER — Telehealth: Payer: Self-pay | Admitting: Cardiology

## 2018-01-03 ENCOUNTER — Telehealth: Payer: Self-pay | Admitting: Family Medicine

## 2018-01-03 ENCOUNTER — Ambulatory Visit (INDEPENDENT_AMBULATORY_CARE_PROVIDER_SITE_OTHER): Payer: Self-pay

## 2018-01-03 DIAGNOSIS — I5042 Chronic combined systolic (congestive) and diastolic (congestive) heart failure: Secondary | ICD-10-CM

## 2018-01-03 DIAGNOSIS — H353131 Nonexudative age-related macular degeneration, bilateral, early dry stage: Secondary | ICD-10-CM | POA: Diagnosis not present

## 2018-01-03 DIAGNOSIS — Z95810 Presence of automatic (implantable) cardiac defibrillator: Secondary | ICD-10-CM

## 2018-01-03 DIAGNOSIS — H0589 Other disorders of orbit: Secondary | ICD-10-CM | POA: Diagnosis not present

## 2018-01-03 NOTE — Telephone Encounter (Signed)
Left message with verbal orders and they will fax necessary paperwork.

## 2018-01-03 NOTE — Telephone Encounter (Signed)
[name] [name], Physical Therapist with [facility], left message on nurse line stating that patient is making progress in therapy, but they are wanting to extend treatment. She is asking for verbal orders to continue therapy at 2x/2weeks, for strength and endurance. She states it is okay to leave a message on her voicemail as it is secure.   Callback# [phone]

## 2018-01-03 NOTE — Telephone Encounter (Signed)
LMOVM reminding pt to send remote transmission.   

## 2018-01-04 ENCOUNTER — Telehealth: Payer: Self-pay | Admitting: Cardiovascular Disease

## 2018-01-04 DIAGNOSIS — I5023 Acute on chronic systolic (congestive) heart failure: Secondary | ICD-10-CM | POA: Diagnosis not present

## 2018-01-04 DIAGNOSIS — J441 Chronic obstructive pulmonary disease with (acute) exacerbation: Secondary | ICD-10-CM | POA: Diagnosis not present

## 2018-01-04 DIAGNOSIS — M5136 Other intervertebral disc degeneration, lumbar region: Secondary | ICD-10-CM | POA: Diagnosis not present

## 2018-01-04 DIAGNOSIS — I130 Hypertensive heart and chronic kidney disease with heart failure and stage 1 through stage 4 chronic kidney disease, or unspecified chronic kidney disease: Secondary | ICD-10-CM | POA: Diagnosis not present

## 2018-01-04 DIAGNOSIS — D6832 Hemorrhagic disorder due to extrinsic circulating anticoagulants: Secondary | ICD-10-CM | POA: Diagnosis not present

## 2018-01-04 DIAGNOSIS — N184 Chronic kidney disease, stage 4 (severe): Secondary | ICD-10-CM | POA: Diagnosis not present

## 2018-01-04 NOTE — Telephone Encounter (Signed)
Unable to assist [name] [name] with a manual transmission. Error code 4627, troubleshooting unsuccessful. I gave her the number to call [facility] tech support.

## 2018-01-04 NOTE — Telephone Encounter (Signed)
New Message     1. Has your device fired? no  2. Is you device beeping? no  3. Are you experiencing draining or swelling at device site? no  4. Are you calling to see if we received your device transmission? no  5. Have you passed out? No  Patient is returning call in reference to doing a remote defibrillator check. She does not know how to do it. Please call to discuss.     Please route to [place]

## 2018-01-04 NOTE — Telephone Encounter (Signed)
Call to patient.  She reported a new monitor should arrive in 7-10 days.  Explained once she has a new monitor she can call tech service number she has to get instructions on setting up and sending a transmission.  Requested she call when she gets the report sent from new monitor so that the fluid levels can be reviewed.  Provided ICM direct contact number.

## 2018-01-04 NOTE — Telephone Encounter (Signed)
New Message    1. Has your device fired? no  2. Is you device beeping? no  3. Are you experiencing draining or swelling at device site? no  4. Are you calling to see if we received your device transmission? no  5. Have you passed out? No  Patient is returning call in reference to her transmission. She states that she was getting an error earlier and was advised to contact [facility]. They advised her that they would have to send her a new system and it should arrive next week.     Please route to [place]

## 2018-01-04 NOTE — Telephone Encounter (Signed)
Left message checking to make sure she received message ok'ing verbal orders.

## 2018-01-08 LAB — CUP PACEART REMOTE DEVICE CHECK
Battery Remaining Longevity: 12 mo
Battery Voltage: 2.88 V
Brady Statistic AP VP Percent: 64.46 %
Brady Statistic AP VS Percent: 0.04 %
Brady Statistic AS VP Percent: 35.42 %
Brady Statistic AS VS Percent: 0.09 %
Brady Statistic RA Percent Paced: 63.29 %
Brady Statistic RV Percent Paced: 99.59 %
Date Time Interrogation Session: 20190403041702
HighPow Impedance: 37 Ohm
HighPow Impedance: 47 Ohm
Implantable Lead Implant Date: 20040521
Implantable Lead Implant Date: 20071218
Implantable Lead Implant Date: 20160516
Implantable Lead Location: 753858
Implantable Lead Location: 753859
Implantable Lead Location: 753860
Implantable Lead Model: 5076
Implantable Lead Model: 6947
Implantable Pulse Generator Implant Date: 20160516
Lead Channel Impedance Value: 304 Ohm
Lead Channel Impedance Value: 342 Ohm
Lead Channel Impedance Value: 342 Ohm
Lead Channel Impedance Value: 361 Ohm
Lead Channel Impedance Value: 361 Ohm
Lead Channel Impedance Value: 475 Ohm
Lead Channel Impedance Value: 513 Ohm
Lead Channel Impedance Value: 532 Ohm
Lead Channel Impedance Value: 665 Ohm
Lead Channel Impedance Value: 703 Ohm
Lead Channel Impedance Value: 703 Ohm
Lead Channel Impedance Value: 760 Ohm
Lead Channel Impedance Value: 836 Ohm
Lead Channel Pacing Threshold Amplitude: 0.5 V
Lead Channel Pacing Threshold Amplitude: 1.5 V
Lead Channel Pacing Threshold Amplitude: 5 V
Lead Channel Pacing Threshold Pulse Width: 0.4 ms
Lead Channel Pacing Threshold Pulse Width: 0.4 ms
Lead Channel Pacing Threshold Pulse Width: 1.5 ms
Lead Channel Sensing Intrinsic Amplitude: 1.75 mV
Lead Channel Sensing Intrinsic Amplitude: 1.75 mV
Lead Channel Sensing Intrinsic Amplitude: 10.5 mV
Lead Channel Sensing Intrinsic Amplitude: 10.5 mV
Lead Channel Setting Pacing Amplitude: 2 V
Lead Channel Setting Pacing Amplitude: 2.5 V
Lead Channel Setting Pacing Amplitude: 2.5 V
Lead Channel Setting Pacing Pulse Width: 0.6 ms
Lead Channel Setting Pacing Pulse Width: 1 ms
Lead Channel Setting Sensing Sensitivity: 0.3 mV

## 2018-01-09 ENCOUNTER — Other Ambulatory Visit: Payer: Self-pay

## 2018-01-09 ENCOUNTER — Telehealth: Payer: Self-pay | Admitting: Family Medicine

## 2018-01-09 DIAGNOSIS — D649 Anemia, unspecified: Secondary | ICD-10-CM | POA: Diagnosis not present

## 2018-01-09 DIAGNOSIS — J441 Chronic obstructive pulmonary disease with (acute) exacerbation: Secondary | ICD-10-CM | POA: Diagnosis not present

## 2018-01-09 DIAGNOSIS — N179 Acute kidney failure, unspecified: Secondary | ICD-10-CM | POA: Diagnosis not present

## 2018-01-09 DIAGNOSIS — I5023 Acute on chronic systolic (congestive) heart failure: Secondary | ICD-10-CM | POA: Diagnosis not present

## 2018-01-09 DIAGNOSIS — I130 Hypertensive heart and chronic kidney disease with heart failure and stage 1 through stage 4 chronic kidney disease, or unspecified chronic kidney disease: Secondary | ICD-10-CM | POA: Diagnosis not present

## 2018-01-09 DIAGNOSIS — M5136 Other intervertebral disc degeneration, lumbar region: Secondary | ICD-10-CM | POA: Diagnosis not present

## 2018-01-09 DIAGNOSIS — D6832 Hemorrhagic disorder due to extrinsic circulating anticoagulants: Secondary | ICD-10-CM | POA: Diagnosis not present

## 2018-01-09 DIAGNOSIS — N184 Chronic kidney disease, stage 4 (severe): Secondary | ICD-10-CM | POA: Diagnosis not present

## 2018-01-09 MED ORDER — METOPROLOL SUCCINATE ER 100 MG PO TB24: 100.0000 mg | ORAL_TABLET | Freq: Every day | ORAL | 1 refills | Status: DC

## 2018-01-09 MED ORDER — METOPROLOL SUCCINATE ER 100 MG PO TB24: 100.0000 mg | ORAL_TABLET | Freq: Every day | ORAL | 5 refills | Status: DC

## 2018-01-09 NOTE — Telephone Encounter (Signed)
Done

## 2018-01-09 NOTE — Telephone Encounter (Signed)
Needs refills on Toporal XL

## 2018-01-10 NOTE — Progress Notes (Signed)
EPIC Encounter for ICM Monitoring  Patient Name: [name] [name] is a 84 y.o. female Date: 01/10/2018 Primary Care Physican: Fayrene Helper, MD Primary Cardiologist: Croitoru Electrophysiologist: Lovena Le Dry Weight:  Previous weight 211 lbs Bi-V Pacing:  99.6%       Clinical Status (02-Oct-2017 to 10-Jan-2018) Treated VT/VF 0 episodes  AT/AF 2 episodes  Time in AT/AF 3.0 hr/day (12.7%)  Observations (2) (02-Oct-2017 to 10-Jan-2018)  AT/AF >= 6 hr for 13 days.  Patient Activity less than 1 hr/day for 14 weeks.      Heart Failure questions reviewed, pt asymptomatic.  She has new home monitor.    Thoracic impedance normal.  Prescribed dosage: Furosemide 40 mg 1 tablet daily.  Metolazaone 2.5 mg Take 1 tablet once a week (Wednesday). Potassium 20 mEq take 1 tablet daily.   Labs: 12/11/2017 Creatinine 1.89, BUN 44, Potassium 4.2, Sodium 139, EGFR 23-27 12/09/2017 Creatinine 1.86, BUN 51, Potassium 4.1, Sodium 143, EGFR 24-28  11/17/2017 Creatinine 2.36, BUN 79, Potassium 4.5, Sodium 138, EGFR 18-21  11/16/2017 Creatinine 2.44, BUN 68, Potassium 4.1, Sodium 136, EGFR 17-20  11/15/2017 Creatinine 2.34, BUN 67, Potassium 5.2, Sodium 136, EGFR 18-21  11/07/2017 Creatinine 2.17, BUN 55, Potassium 4.7, Sodium 135, EGFR 20-23  11/02/2017 Creatinine 1.74, BUN 55, Potassium 4.2, Sodium 139, EGFR 26-30  11/01/2017 Creatinine 1.93, BUN 54, Potassium 3.9, Sodium 137, EGFR 23-27  10/31/2017 Creatinine 2.03, BUN 48, Potassium 4.5, Sodium 142, EGFR 22-25  A complete set of results can be found in Results Review.  Recommendations: No changes.  Reinforced sodium restriction. She said she has been following a low salt diet.  Encouraged to call for fluid symptoms.  Follow-up plan: ICM clinic phone appointment on 01/28/2018.    Copy of ICM check sent to Dr. Lovena Le and Dr Sallyanne Kuster.   3 month ICM trend: 01/10/2018    AT/AF    1 Year ICM trend:       [name], RN 01/10/2018 10:10  AM

## 2018-01-10 NOTE — Progress Notes (Signed)
Better, thank you MCr

## 2018-01-11 DIAGNOSIS — D6832 Hemorrhagic disorder due to extrinsic circulating anticoagulants: Secondary | ICD-10-CM | POA: Diagnosis not present

## 2018-01-11 DIAGNOSIS — I5023 Acute on chronic systolic (congestive) heart failure: Secondary | ICD-10-CM | POA: Diagnosis not present

## 2018-01-11 DIAGNOSIS — M5136 Other intervertebral disc degeneration, lumbar region: Secondary | ICD-10-CM | POA: Diagnosis not present

## 2018-01-11 DIAGNOSIS — I130 Hypertensive heart and chronic kidney disease with heart failure and stage 1 through stage 4 chronic kidney disease, or unspecified chronic kidney disease: Secondary | ICD-10-CM | POA: Diagnosis not present

## 2018-01-11 DIAGNOSIS — N184 Chronic kidney disease, stage 4 (severe): Secondary | ICD-10-CM | POA: Diagnosis not present

## 2018-01-11 DIAGNOSIS — J441 Chronic obstructive pulmonary disease with (acute) exacerbation: Secondary | ICD-10-CM | POA: Diagnosis not present

## 2018-01-15 DIAGNOSIS — I5023 Acute on chronic systolic (congestive) heart failure: Secondary | ICD-10-CM | POA: Diagnosis not present

## 2018-01-15 DIAGNOSIS — M5136 Other intervertebral disc degeneration, lumbar region: Secondary | ICD-10-CM | POA: Diagnosis not present

## 2018-01-15 DIAGNOSIS — J441 Chronic obstructive pulmonary disease with (acute) exacerbation: Secondary | ICD-10-CM | POA: Diagnosis not present

## 2018-01-15 DIAGNOSIS — I130 Hypertensive heart and chronic kidney disease with heart failure and stage 1 through stage 4 chronic kidney disease, or unspecified chronic kidney disease: Secondary | ICD-10-CM | POA: Diagnosis not present

## 2018-01-15 DIAGNOSIS — N184 Chronic kidney disease, stage 4 (severe): Secondary | ICD-10-CM | POA: Diagnosis not present

## 2018-01-15 DIAGNOSIS — D6832 Hemorrhagic disorder due to extrinsic circulating anticoagulants: Secondary | ICD-10-CM | POA: Diagnosis not present

## 2018-01-16 ENCOUNTER — Ambulatory Visit (INDEPENDENT_AMBULATORY_CARE_PROVIDER_SITE_OTHER): Payer: Medicare Other | Admitting: *Deleted

## 2018-01-16 DIAGNOSIS — Z5181 Encounter for therapeutic drug level monitoring: Secondary | ICD-10-CM

## 2018-01-16 DIAGNOSIS — Z952 Presence of prosthetic heart valve: Secondary | ICD-10-CM

## 2018-01-16 LAB — POCT INR: INR: 2.4

## 2018-01-16 MED ORDER — COUMADIN 5 MG PO TABS: ORAL_TABLET | ORAL | 3 refills | Status: DC

## 2018-01-16 NOTE — Patient Instructions (Signed)
Continue coumadin 2 tablets daily except 1 tablet on Tuesdays, Thursdays and Saturdays.  Recheck in 4 weeks.

## 2018-01-17 DIAGNOSIS — I130 Hypertensive heart and chronic kidney disease with heart failure and stage 1 through stage 4 chronic kidney disease, or unspecified chronic kidney disease: Secondary | ICD-10-CM | POA: Diagnosis not present

## 2018-01-17 DIAGNOSIS — D6832 Hemorrhagic disorder due to extrinsic circulating anticoagulants: Secondary | ICD-10-CM | POA: Diagnosis not present

## 2018-01-17 DIAGNOSIS — J441 Chronic obstructive pulmonary disease with (acute) exacerbation: Secondary | ICD-10-CM | POA: Diagnosis not present

## 2018-01-17 DIAGNOSIS — N184 Chronic kidney disease, stage 4 (severe): Secondary | ICD-10-CM | POA: Diagnosis not present

## 2018-01-17 DIAGNOSIS — M5136 Other intervertebral disc degeneration, lumbar region: Secondary | ICD-10-CM | POA: Diagnosis not present

## 2018-01-17 DIAGNOSIS — I5023 Acute on chronic systolic (congestive) heart failure: Secondary | ICD-10-CM | POA: Diagnosis not present

## 2018-01-21 ENCOUNTER — Other Ambulatory Visit: Payer: Self-pay | Admitting: Family Medicine

## 2018-01-21 ENCOUNTER — Other Ambulatory Visit: Payer: Self-pay | Admitting: *Deleted

## 2018-01-21 NOTE — Patient Outreach (Addendum)
[facility]) Care Management  01/21/2018  [name] [name] [birth year] [id number]   Case closure EMMI CHF red flags   CM received message from [place] about EMMI CHF red flags for 01/10/18, 4/20-22/19, [name] [name] stating she has not been checking her weights [name] [name] informs CM she has been checking her weights except on the days she has been out of the home to MD appointments.  She reports receiving calls when she may not have checked her weight in the am but has checked in the pm. [name] [name] reports discrepancies in the [facility] automated services. She denies being loss of interest in things as reported in 01/14/18 red flag She reports change in her weight values but has addressed them with diet and medicines as recommended for red flags on 01/15/18 and 01/16/18 CM discussed case closure and she reports no medical issues and denies any new or worsening medical problems at this time   [name] [name] voiced understanding about her services for a lighter oxygen tank via [place] She has a concentrator with small tanks already MD office assisted with this [name] [name] has received home health services and is not able to afford personal care services charges  Plans case closure  Letters to patient and MD   [facility] CM Care Plan Problem One     Most Recent Value  Care Plan Problem One  possible lighter portable oxygen tank  Role Documenting the Problem One  Care Management [place] for Problem One  Active  [facility] Long Term Goal   over the next 31 days patient will call [employer] to discuss her options of getting a lighter oxygen tank  [facility] Long Term Goal Start Date  12/18/17  [facility] Long Term Goal Met Date  01/02/18  [facility] CM Short Term Goal #1   over the next 7 days patient will speak with [employer]  [facility] CM Short Term Goal #1 Start Date  12/18/17  [facility] CM Short Term Goal #1 Met Date  01/02/18    [facility] CM Care Plan Problem Two     Most Recent Value  Care Plan Problem Two  home health or  personal care aide services wanted by patient  Role Documenting the Problem Two  Care Management Coordinator  Care Plan for Problem Two  Active  [facility] Long Term Goal  over the next 31 days patient will inquire and be offered home health or personal care services she is requesting if available  [facility] Long Term Goal Start Date  12/18/17  [facility] Long Term Goal Met Date  01/02/18  [facility] CM Short Term Goal #1   over the net 7 days patient will follow up on hh aide servicees via her home health agency staff  [facility] CM Short Term Goal #1 Start Date  12/18/17  [facility] CM Short Term Goal #1 Met Date   01/02/18     Joelene Millin L. Lavina [name], RN, BSN, [place] Coordinator [phone] week day mobile

## 2018-01-21 NOTE — Patient Outreach (Signed)
[facility]) Care Management  12/25/2017  [name] [name] 12-09-1931 [id number]   Transition of care week 3    CM noted a missed call from [name] [name] on mobile phone from 12/24/17 at 1825 but no message left for CM CM spoke with [name] [name] who informed CM that her primary MD wanted CM to make visits.  She denies any medical concerns today This patient is being followed by home health services CM discussed with [name] [name] that CM completed home visits with her many of weeks with lots of education, she is able to voice the appropriate needed home care but with poor compliance. Cm discussed she is now being followed for transition of care program. CM had [name] [name] to read the letter she stated was from her primary MD.  She read the letter out loud but none of the contents stated home visits were needed but she is scheduled to be seen at the MD office.  This was pointed out to [name] [name] and she was informed that [facility] Cm would contact [name] [name] office. A message was left for [name] [name] at [phone] and then CM spoke with her at [place] about the call from [name] [name]. [name] Griffin Dakin [name] confirmed that a letter was sent to [name] [name] to have her seen by the staff at the MD office not by [facility] CM and will contact [name] [name]  Plans continue to follow for transition of care services with pending case closure  [name] Pooley L. Lavina [name], [name], BSN, [place] Coordinator [phone] week day mobile

## 2018-01-22 DIAGNOSIS — L851 Acquired keratosis [keratoderma] palmaris et plantaris: Secondary | ICD-10-CM | POA: Diagnosis not present

## 2018-01-22 DIAGNOSIS — E1142 Type 2 diabetes mellitus with diabetic polyneuropathy: Secondary | ICD-10-CM | POA: Diagnosis not present

## 2018-01-22 DIAGNOSIS — L603 Nail dystrophy: Secondary | ICD-10-CM | POA: Diagnosis not present

## 2018-01-28 ENCOUNTER — Ambulatory Visit (INDEPENDENT_AMBULATORY_CARE_PROVIDER_SITE_OTHER): Payer: Medicare Other

## 2018-01-28 DIAGNOSIS — Z95810 Presence of automatic (implantable) cardiac defibrillator: Secondary | ICD-10-CM

## 2018-01-28 DIAGNOSIS — I5042 Chronic combined systolic (congestive) and diastolic (congestive) heart failure: Secondary | ICD-10-CM

## 2018-01-28 NOTE — Progress Notes (Signed)
EPIC Encounter for ICM Monitoring  Patient Name: [name] [name] is a 84 y.o. female Date: 01/28/2018 Primary Care Physican: Fayrene Helper, MD Primary Cardiologist:Croitoru Electrophysiologist:Taylor Dry Weight:206lbs Bi-V Pacing:99.5%  Clinical Status (10-Jan-2018 to 28-Jan-2018) Treated VT/VF 0 episodes  AT/AF 0 episodes  Time in AT/AF <0.1 hr/day (<0.1%) Observations (1) (10-Jan-2018 to 28-Jan-2018)  Patient Activity less than 1 hr/day for 2 weeks.       Heart Failure questions reviewed, pt asymptomatic.   Thoracic impedance abnormal suggesting fluid accumulation since 01/20/2018 and close to baseline today.  Prescribed dosage: Furosemide40 mg 1 tablet daily. Metolazaone 2.5 mg Take 1tablet once a week (Wednesday). Potassium 20 mEq take 1 tablet daily.  Labs: 12/11/2017 Creatinine1.89, BUN44, Potassium4.2, [id number], [id number] 12/09/2017 Creatinine1.86, BUN51, Potassium4.1, [id number], [id number]  11/17/2017 Creatinine2.36, BUN79, Potassium4.5, [id number], [id number]  11/16/2017 Creatinine2.44, BUN68, Potassium4.1, [id number], [id number]  11/15/2017 Creatinine2.34, BUN67, Potassium5.2, [id number], [id number]  11/07/2017 Creatinine2.17, BUN55, Potassium4.7, [id number], [id number]  11/02/2017 Creatinine1.74, BUN55, Potassium4.2, [id number], [id number]  11/01/2017 Creatinine1.93, BUN54, Potassium3.9, [id number], [id number]  10/31/2017 Creatinine2.03, BUN48, Potassium4.5, [id number], [id number] A complete set of results can be found in Results Review.  Recommendations: No changes.  Discussed low salt dietary options.  Encouraged to call for fluid symptoms.  Follow-up plan: ICM clinic phone appointment on 02/28/2018.    Copy of ICM check sent to Dr. Lovena Le and Dr. Sallyanne Kuster.   3 month ICM trend: 01/28/2018    1 Year ICM trend:       [name], RN 01/28/2018 12:23 PM

## 2018-01-29 NOTE — Progress Notes (Signed)
Thanks MCr 

## 2018-01-30 ENCOUNTER — Inpatient Hospital Stay (HOSPITAL_COMMUNITY): Payer: Medicare Other

## 2018-01-30 ENCOUNTER — Inpatient Hospital Stay (HOSPITAL_COMMUNITY): Payer: Medicare Other | Attending: Internal Medicine

## 2018-01-30 ENCOUNTER — Ambulatory Visit (HOSPITAL_COMMUNITY): Payer: Medicare Other | Admitting: Internal Medicine

## 2018-01-30 ENCOUNTER — Encounter (HOSPITAL_COMMUNITY): Payer: Self-pay

## 2018-01-30 VITALS — BP 131/55 | HR 70 | Temp 98.0°F | Resp 20

## 2018-01-30 DIAGNOSIS — E039 Hypothyroidism, unspecified: Secondary | ICD-10-CM | POA: Diagnosis not present

## 2018-01-30 DIAGNOSIS — D631 Anemia in chronic kidney disease: Secondary | ICD-10-CM | POA: Diagnosis present

## 2018-01-30 DIAGNOSIS — D649 Anemia, unspecified: Secondary | ICD-10-CM | POA: Diagnosis not present

## 2018-01-30 DIAGNOSIS — Z79899 Other long term (current) drug therapy: Secondary | ICD-10-CM | POA: Insufficient documentation

## 2018-01-30 DIAGNOSIS — I10 Essential (primary) hypertension: Secondary | ICD-10-CM | POA: Insufficient documentation

## 2018-01-30 DIAGNOSIS — N184 Chronic kidney disease, stage 4 (severe): Secondary | ICD-10-CM | POA: Insufficient documentation

## 2018-01-30 DIAGNOSIS — Z87891 Personal history of nicotine dependence: Secondary | ICD-10-CM | POA: Diagnosis not present

## 2018-01-30 DIAGNOSIS — E890 Postprocedural hypothyroidism: Secondary | ICD-10-CM | POA: Insufficient documentation

## 2018-01-30 DIAGNOSIS — M2550 Pain in unspecified joint: Secondary | ICD-10-CM | POA: Diagnosis not present

## 2018-01-30 DIAGNOSIS — I129 Hypertensive chronic kidney disease with stage 1 through stage 4 chronic kidney disease, or unspecified chronic kidney disease: Secondary | ICD-10-CM | POA: Diagnosis not present

## 2018-01-30 DIAGNOSIS — E611 Iron deficiency: Secondary | ICD-10-CM

## 2018-01-30 LAB — CBC WITH DIFFERENTIAL/PLATELET
Basophils Absolute: 0 10*3/uL (ref 0.0–0.1)
Basophils Relative: 0 %
Eosinophils Absolute: 0 10*3/uL (ref 0.0–0.7)
Eosinophils Relative: 0 %
HCT: 30.9 % — ABNORMAL LOW (ref 36.0–46.0)
Hemoglobin: 9.6 g/dL — ABNORMAL LOW (ref 12.0–15.0)
Lymphocytes Relative: 14 %
Lymphs Abs: 1 10*3/uL (ref 0.7–4.0)
MCH: 29.3 pg (ref 26.0–34.0)
MCHC: 31.1 g/dL (ref 30.0–36.0)
MCV: 94.2 fL (ref 78.0–100.0)
Monocytes Absolute: 0.5 10*3/uL (ref 0.1–1.0)
Monocytes Relative: 7 %
Neutro Abs: 5.6 10*3/uL (ref 1.7–7.7)
Neutrophils Relative %: 79 %
Platelets: 188 10*3/uL (ref 150–400)
RBC: 3.28 MIL/uL — ABNORMAL LOW (ref 3.87–5.11)
RDW: 16 % — ABNORMAL HIGH (ref 11.5–15.5)
WBC: 7.1 10*3/uL (ref 4.0–10.5)

## 2018-01-30 LAB — IRON AND TIBC
Iron: 56 ug/dL (ref 28–170)
Saturation Ratios: 20 % (ref 10.4–31.8)
TIBC: 274 ug/dL (ref 250–450)
UIBC: 218 ug/dL

## 2018-01-30 LAB — FERRITIN: Ferritin: 943 ng/mL — ABNORMAL HIGH (ref 11–307)

## 2018-01-30 MED ORDER — EPOETIN ALFA 40000 UNIT/ML IJ SOLN
40000.0000 [IU] | Freq: Once | INTRAMUSCULAR | Status: AC
  Administered 2018-01-30: 40000 [IU] via SUBCUTANEOUS
  Filled 2018-01-30: qty 1

## 2018-01-30 NOTE — Progress Notes (Signed)
Patient tolerated injection with no complaints of pain voiced.  Site clean and dry with no bruising or swelling noted at site.  Band aid applied.  VSS with discharge and left via wheelchair with caregiver and no s/s of distress noted.

## 2018-01-30 NOTE — Patient Instructions (Signed)
[place] at [facility]  Discharge Instructions:  You received your injection today.  Return tomorrow for your doctors appointment.  _______________________________________________________________  Thank you for choosing [place] at [facility] to provide your oncology and hematology care.  To afford each patient quality time with our providers, please arrive at least 15 minutes before your scheduled appointment.  You need to re-schedule your appointment if you arrive 10 or more minutes late.  We strive to give you quality time with our providers, and arriving late affects you and other patients whose appointments are after yours.  Also, if you no show three or more times for appointments you may be dismissed from the clinic.  Again, thank you for choosing [place] at [place] hope is that these requests will allow you access to exceptional care and in a timely manner. _______________________________________________________________  If you have questions after your visit, please contact our office at [phone] between the hours of 8:30 a.m. and 5:00 p.m. Voicemails left after 4:30 p.m. will not be returned until the following business day. _______________________________________________________________  For prescription refill requests, have your pharmacy contact our office. _______________________________________________________________  Recommendations made by the consultant and any test results will be sent to your referring physician. _______________________________________________________________

## 2018-01-31 ENCOUNTER — Encounter (HOSPITAL_COMMUNITY): Payer: Self-pay | Admitting: Internal Medicine

## 2018-01-31 ENCOUNTER — Other Ambulatory Visit: Payer: Self-pay

## 2018-01-31 ENCOUNTER — Inpatient Hospital Stay (HOSPITAL_BASED_OUTPATIENT_CLINIC_OR_DEPARTMENT_OTHER): Payer: Medicare Other | Admitting: Internal Medicine

## 2018-01-31 VITALS — BP 103/51 | HR 72 | Temp 98.2°F | Resp 16

## 2018-01-31 DIAGNOSIS — N184 Chronic kidney disease, stage 4 (severe): Secondary | ICD-10-CM

## 2018-01-31 DIAGNOSIS — D649 Anemia, unspecified: Secondary | ICD-10-CM

## 2018-01-31 DIAGNOSIS — E039 Hypothyroidism, unspecified: Secondary | ICD-10-CM | POA: Diagnosis not present

## 2018-01-31 DIAGNOSIS — M2550 Pain in unspecified joint: Secondary | ICD-10-CM

## 2018-01-31 DIAGNOSIS — I129 Hypertensive chronic kidney disease with stage 1 through stage 4 chronic kidney disease, or unspecified chronic kidney disease: Secondary | ICD-10-CM

## 2018-01-31 DIAGNOSIS — Z87891 Personal history of nicotine dependence: Secondary | ICD-10-CM | POA: Diagnosis not present

## 2018-01-31 NOTE — Progress Notes (Signed)
Diagnosis Anemia, unspecified type - Plan: CBC with Differential/Platelet, CBC with Differential/Platelet, Comprehensive metabolic panel, Lactate dehydrogenase, Protein electrophoresis, serum, Ferritin  Staging Cancer Staging No matching staging information was found for the patient.  Assessment and Plan:  1.  Anemia.  Pt was followed by NP Renato Battles and anemia was thought to primarily be secondary to stage 4 chronic kidney disease, as well as possible hemolysis with mechanical mitral and aortic valve replacements. Also may have element of iron deficiency as well. She has been on Procrit that initially started ~3 years ago, with overall stability in her hemoglobin.  She is now on  Procrit injections 40,000 u  every 6 weeks.    Labs done 01/30/2018 show HB 9.6.  She was treated with Procrit and will RTC in 6 weeks for repeat labs and possible Procrit.  She will be seen for follow-up in 6 months with labs.  She will continue to have labs done every 6 weeks.    2.  CKD.  She is followed by nephrology.  Follow-up as directed.  Will repeat chemistries on RTC.     3.  Chronic arthralgias.  Managed by Dr. Luna Glasgow. He manages her pain medications as well.   4.  HTN.  BP is 103/51.  Follow-up with PCP.    5.  Hypothyroidism.  She is on Synthroid.  Follow-up with PCP for monitoring.    Current Status:  Pt is seen today for follow-up.  She is here to go over labs.     Problem List Patient Active Problem List   Diagnosis Date Noted  . Volume overload [E87.70] 12/09/2017  . Acute on chronic systolic CHF (congestive heart failure) ([place]) [I50.23] 11/15/2017  . COPD with acute exacerbation ([place]) [J44.1] 11/15/2017  . Hospital discharge follow-up [Z09] 11/07/2017  . Trochanteric bursitis, right hip [M70.61] 03/07/2017  . SOB (shortness of breath) [R06.02] 11/16/2016  . Persistent atrial fibrillation ([place]) [I48.1]   . Nocturnal hypoxia [G47.34] 10/08/2016  . Dependence on nocturnal oxygen therapy  [Z99.81] 10/08/2016  . S/P ICD (internal cardiac defibrillator) procedure [Z95.810] 02/08/2015  . CHB (complete heart block) ([place]) [I44.2] 10/07/2014  . Fatigue [R53.83] 07/20/2014  . Anemia [D64.9] 06/24/2014  . Osteopenia [M85.80] 02/10/2014  . Encounter for therapeutic drug monitoring [Z51.81] 10/29/2013  . Spinal stenosis of lumbar region [M48.061] 02/19/2013  . OA (osteoarthritis) of knee [M17.10] 02/19/2013  . Chronic pain of right knee [M25.561, G89.29] 01/02/2013  . 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 H/O adenomatous polyp of colon [Z86.010] 05/24/2012  . High risk medication use [Z79.899] 05/24/2012  . COPD (chronic obstructive pulmonary disease) ([place]) [J44.9] 05/19/2012  . Chronic systolic heart failure ([place]) [I50.22] 05/19/2012  . Chronic anticoagulation, on coumadin for Mechanical AVR and MVR [Z79.01] 04/01/2012  . Cardiorenal syndrome with renal failure [I13.10, N19] 03/22/2012  . S/P AVR (aortic valve replacement) [Z95.2] 03/22/2012  . S/P MVR (mitral valve replacement) [Z95.2] 03/22/2012  . AICD (automatic cardioverter/defibrillator) present [Z95.810] 03/22/2012  . CKD (chronic kidney disease), stage IV ([place]) [N18.4] 03/22/2012  . Acute respiratory failure with hypoxia ([place]) [J96.01] 03/21/2012  . Warfarin-induced coagulopathy ([place]) [Z61.09, U04.540J] 03/21/2012  . Papilloma of breast [D24.9] 03/06/2012  . Iron deficiency [E61.1] 10/04/2011  . Allergic rhinitis [J30.9] 02/14/2011  . Nausea without vomiting [R11.0] 06/07/2010  . HIP PAIN, RIGHT [M25.559] 04/23/2009  . VARICOSE VEINS LOWER EXTREMITIES W/OTH COMPS [W11.914] 02/09/2009  . Calculus of gallbladder with chronic cholecystitis without obstruction [K80.10] 02/09/2009  . Prediabetes [R73.03] 06/15/2008  .  Hypothyroidism, postsurgical [E89.0] 02/24/2008  . Hyperlipidemia LDL goal <70 [E78.5] 02/24/2008  . Mild obesity [E66.9] 02/24/2008  . Essential hypertension [I10] 02/24/2008  . Non-ischemic  cardiomyopathy with ICD [I42.8] 02/24/2008  . GERD [K21.9] 02/24/2008  . DEGENERATIVE JOINT DISEASE, SPINE [M47.9] 02/24/2008    Past Medical History Past Medical History:  Diagnosis Date  . Adenomatous polyp of colon 12/16/2002   Dr. [name]/[facility]  . Allergy   . Cataract   . CHB (complete heart block) ([place]) 10/07/2014      . CHF (congestive heart failure) ([place])    a.  EF previously reduced to 25-30% b. EF improved to 60-65% by echo in 10/2017.   Marland Kitchen Chronic kidney disease, stage I 2006  . Constipation       . COPD (chronic obstructive pulmonary disease) ([place])       . DJD (degenerative joint disease) of lumbar spine   . DM (diabetes mellitus) ([place]) 2006  . Esophageal reflux   . Glaucoma   . Gout       . H/O heart valve replacement with mechanical valve    a. s/p mechanical AVR in 2001 and mechanical MVR in 2004.  Marland Kitchen Hyperlipemia   . IDA (iron deficiency anemia)    Parenteral iron/[name]  . Insomnia       . Non-ischemic cardiomyopathy ([place])    a. s/p MDT CRTD  . Obesity, unspecified   . Other and unspecified hyperlipidemia   . Oxygen deficiency 2014   nocturnal;  . Spondylolisthesis   . Spondylosis   . Thyroid cancer ([place])    remote thyroidectomy, no recurrence, pt denies in 2016 thyroid cancer  . Unspecified hypothyroidism       . 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 Vertigo         Past Surgical History Past Surgical History:  Procedure Laterality Date  . AORTIC AND MITRAL VALVE REPLACEMENT  2001  . BI-VENTRICULAR IMPLANTABLE CARDIOVERTER DEFIBRILLATOR UPGRADE N/A 01/18/2015   Procedure: BI-VENTRICULAR IMPLANTABLE CARDIOVERTER DEFIBRILLATOR UPGRADE;  Surgeon: Evans Lance, MD;  Location: [facility] CATH LAB;  Service: Cardiovascular;  Laterality: N/A;  . CARDIAC CATHETERIZATION    . CARDIAC VALVE REPLACEMENT    . CARDIOVERSION N/A 11/13/2016   Procedure: CARDIOVERSION;  Surgeon: Sanda Klein, MD;   Location: [facility] ENDOSCOPY;  Service: Cardiovascular;  Laterality: N/A;  . COLONOSCOPY  06/12/2007   Rourk- Normal rectum/Normal colon  . COLONOSCOPY  12/16/02   small hemorrhoids  . COLONOSCOPY  06/20/2012   Procedure: COLONOSCOPY;  Surgeon: Daneil Dolin, MD;  Location: [facility] ENDO SUITE;  Service: Endoscopy;  Laterality: N/A;  10:45  . defibrillator implanted 2006    . DOPPLER ECHOCARDIOGRAPHY  2012  . DOPPLER ECHOCARDIOGRAPHY  05,06,07,08,09,2011  . ESOPHAGOGASTRODUODENOSCOPY  06/12/2007   Rourk- normal esophagus, small hiatal hernia, otherwise normal stomach, D1, D2  . EYE SURGERY Right 2005  . EYE SURGERY Left 2006  . ICD LEAD REMOVAL Left 02/08/2015   Procedure: ICD LEAD REMOVAL;  Surgeon: Evans Lance, MD;  Location[facility] OR;  Service: Cardiovascular;  Laterality: Left;  "Will plan extraction and insertion of a BiV PM"  **Dr. Roxan Hockey backing up case**  . IMPLANTABLE CARDIOVERTER DEFIBRILLATOR (ICD) GENERATOR CHANGE Left 02/08/2015   Procedure: ICD GENERATOR CHANGE;  Surgeon: Evans Lance, MD;  Location: [facility];  Service: Cardiovascular;  Laterality: Left;  . INSERT / REPLACE / REMOVE PACEMAKER    . PACEMAKER INSERTION  May 2016  . pacemaker placed  2004  . right breast cyst removed     benign   . right cataract removed     2005  . TEE WITHOUT CARDIOVERSION N/A 11/13/2016   Procedure: TRANSESOPHAGEAL ECHOCARDIOGRAM (TEE);  Surgeon: Sanda Klein, MD;  Location: [facility] ENDOSCOPY;  Service: Cardiovascular;  Laterality: N/A;  . THYROIDECTOMY      Family History Family History  Problem Relation Age of Onset  . Pancreatic cancer Mother   . Heart disease Father   . Heart disease Sister   . Heart attack Sister   . Heart disease Brother   . Diabetes Brother   . Heart disease Brother   . Diabetes Brother   . Hypertension Brother   . Lung cancer Brother      Social History  reports that she quit smoking about 30 years ago. Her smoking use included cigarettes. She has a 30.00  pack-year smoking history. She has never used smokeless tobacco. She reports that she does not drink alcohol or use drugs.  Medications  Current Outpatient Medications:  .  acetaminophen (TYLENOL) 500 MG tablet, Take 500 mg by mouth every 6 (six) hours as needed (pain)., Disp: , Rfl:  .  albuterol (PROVENTIL) (2.5 MG/3ML) 0.083% nebulizer solution, INHALE 1 VIAL VIA NEBULIZER THREE TIMES DAILY. (Patient taking differently: INHALE 1 VIAL VIA NEBULIZER THREE TIMES DAILY AS NEEDED), Disp: 90 vial, Rfl: 5 .  albuterol (VENTOLIN HFA) 108 (90 Base) MCG/ACT inhaler, Inhale 2 puffs into the lungs every 6 (six) hours as needed for wheezing or shortness of breath., Disp: 3 Inhaler, Rfl: 3 .  allopurinol (ZYLOPRIM) 100 MG tablet, Take 1 tablet (100 mg total) by mouth daily., Disp: 90 tablet, Rfl: 1 .  azelastine (ASTELIN) 0.1 % nasal spray, Place 2 sprays into both nostrils daily as needed for allergies. Use in each nostril as directed, Disp: , Rfl:  .  BREO ELLIPTA 100-25 MCG/INH AEPB, Inhale 1 puff into the lungs daily. , Disp: , Rfl: 1 .  calcium carbonate (OS-CAL) 1250 (500 Ca) MG chewable tablet, Chew 1 tablet by mouth daily., Disp: , Rfl:  .  cholecalciferol (VITAMIN D) 1000 UNITS tablet, Take 1,000 Units by mouth daily., Disp: , Rfl:  .  COUMADIN 5 MG tablet, Take 1 tablet Tuesday, Thursday, and Saturday and  Take 2 tablets all other days., Disp: 180 tablet, Rfl: 3 .  Darbepoetin Alfa (ARANESP, ALBUMIN FREE, IJ), EVERY THURSDAY, Disp: , Rfl:  .  digoxin (LANOXIN) 0.125 MG tablet, Take 1 tablet (0.125 mg total) by mouth every other day., Disp: 45 tablet, Rfl: 3 .  docusate sodium (COLACE) 100 MG capsule, Take 300 mg by mouth at bedtime., Disp: , Rfl:  .  fluticasone (FLONASE) 50 MCG/ACT nasal spray, Place 2 sprays into both nostrils daily. Pt needs now, Disp: 48 g, Rfl: 1 .  folic acid (FOLVITE) 1 MG tablet, TAKE 1 TABLET BY MOUTH ONCE DAILY, Disp: 100 tablet, Rfl: 0 .  furosemide (LASIX) 40 MG tablet,  Take 1 tablet by mouth  daily, Disp: 90 tablet, Rfl: 1 .  gabapentin (NEURONTIN) 300 MG capsule, Take 1 capsule (300 mg total) by mouth at bedtime., Disp: 90 capsule, Rfl: 1 .  guaiFENesin (MUCINEX) 600 MG 12 hr tablet, Take 1 tablet (600 mg total) by mouth 2 (two) times daily., Disp: 60 tablet, Rfl: 2 .  hydrALAZINE (APRESOLINE) 25 MG tablet, Take 1 tablet (25 mg total) by mouth 3 (three) times daily., Disp: 270 tablet,  Rfl: 3 .  HYDROcodone-acetaminophen (NORCO) 7.5-325 MG tablet, Take 1 tablet by mouth every 6 (six) hours as needed for moderate pain (Must last 30 days.)., Disp: 70 tablet, Rfl: 0 .  isosorbide mononitrate (IMDUR) 60 MG 24 hr tablet, Take 1 tablet (60 mg total) by mouth daily., Disp: 90 tablet, Rfl: 1 .  meclizine (ANTIVERT) 25 MG tablet, Take 1 tablet (25 mg total) by mouth 3 (three) times daily as needed for dizziness., Disp: 30 tablet, Rfl: 0 .  metolazone (ZAROXOLYN) 2.5 MG tablet, TAKE 1 TABLET ONCE A WEEK (Patient taking differently: Take 2.5 mg by mouth. TAKE 1 TABLET ONCE A WEEK (Wednesday)), Disp: 12 tablet, Rfl: 2 .  metoprolol succinate (TOPROL XL) 100 MG 24 hr tablet, Take 1 tablet (100 mg total) by mouth daily. Take with or immediately following a meal., Disp: 30 tablet, Rfl: 5 .  Multiple Vitamin (MULTIVITAMIN) LIQD, Take 5 mLs by mouth daily., Disp: , Rfl:  .  ondansetron (ZOFRAN) 4 MG tablet, Take 1 tablet (4 mg total) by mouth daily as needed for nausea or vomiting., Disp: 90 tablet, Rfl: 0 .  Polyethyl [name] (SYSTANE OP), Place 1 drop into both eyes daily as needed (dry eyes)., Disp: , Rfl:  .  potassium chloride SA (KLOR-CON M20) 20 MEQ tablet, Take 1 tablet by mouth  daily, Disp: 90 tablet, Rfl: 1 .  pravastatin (PRAVACHOL) 40 MG tablet, Take 1 tablet (40 mg total) by mouth every evening., Disp: 90 tablet, Rfl: 3 .  SYNTHROID 75 MCG tablet, Take 1 tablet by mouth  daily, Disp: 90 tablet, Rfl: 1 No current facility-administered medications for this  visit.   Facility-Administered Medications Ordered in Other Visits:  .  epoetin alfa (EPOGEN,PROCRIT) injection 40,000 Units, 40,000 Units, Subcutaneous, Once, Farrel Gobble, MD  Allergies Penicillins; Aspirin; Fentanyl; and Niacin  Review of Systems Review of Systems - Oncology ROS as per HPI otherwise 12 point ROS is negative.   Physical Exam  Vitals Wt Readings from Last 3 Encounters:  12/27/17 216 lb (98 kg)  12/24/17 213 lb (96.6 kg)  12/18/17 212 lb (96.2 kg)   Temp Readings from Last 3 Encounters:  01/31/18 98.2 F (36.8 C) (Oral)  01/30/18 98 F (36.7 C) (Oral)  12/19/17 98.2 F (36.8 C) (Oral)   BP Readings from Last 3 Encounters:  01/31/18 (!) 103/51  01/30/18 (!) 131/55  12/27/17 (!) 110/54   Pulse Readings from Last 3 Encounters:  01/31/18 72  01/30/18 70  12/27/17 79    Constitutional: Well-developed, well-nourished, and in no distress.   HENT: Head: Normocephalic and atraumatic.  Mouth/Throat: No oropharyngeal exudate. Mucosa moist. Eyes: Pupils are equal, round, and reactive to light. Conjunctivae are normal. No scleral icterus.  Neck: Normal range of motion. Neck supple. No JVD present.  Cardiovascular: Normal rate, regular rhythm and normal heart sounds.  Exam reveals no gallop and no friction rub.   No murmur heard. Pulmonary/Chest: Effort normal and breath sounds normal. No respiratory distress. No wheezes.No rales.  Abdominal: Soft. Bowel sounds are normal. No distension. There is no tenderness. There is no guarding.  Musculoskeletal: No edema or tenderness.  Lymphadenopathy: No cervical, axillary or supraclavicular adenopathy.  Neurological: Alert and oriented to person, place, and time. No cranial nerve deficit.  Skin: Skin is warm and dry. No rash noted. No erythema. No pallor.  Psychiatric: Affect and judgment normal.   Labs Appointment on 01/30/2018  Component Date Value Ref Range Status  . WBC 01/30/2018 7.1  4.0 - 10.5 K/uL  Final  . RBC 01/30/2018 3.28* 3.87 - 5.11 MIL/uL Final  . Hemoglobin 01/30/2018 9.6* 12.0 - 15.0 g/dL Final  . HCT 01/30/2018 30.9* 36.0 - 46.0 % Final  . MCV 01/30/2018 94.2  78.0 - 100.0 fL Final  . MCH 01/30/2018 29.3  26.0 - 34.0 pg Final  . MCHC 01/30/2018 31.1  30.0 - 36.0 g/dL Final  . RDW 01/30/2018 16.0* 11.5 - 15.5 % Final  . Platelets 01/30/2018 188  150 - 400 K/uL Final  . Neutrophils Relative % 01/30/2018 79  % Final  . Neutro Abs 01/30/2018 5.6  1.7 - 7.7 K/uL Final  . Lymphocytes Relative 01/30/2018 14  % Final  . Lymphs Abs 01/30/2018 1.0  0.7 - 4.0 K/uL Final  . Monocytes Relative 01/30/2018 7  % Final  . Monocytes Absolute 01/30/2018 0.5  0.1 - 1.0 K/uL Final  . Eosinophils Relative 01/30/2018 0  % Final  . Eosinophils Absolute 01/30/2018 0.0  0.0 - 0.7 K/uL Final  . Basophils Relative 01/30/2018 0  % Final  . Basophils Absolute 01/30/2018 0.0  0.0 - 0.1 K/uL Final   Performed at [facility], [address] [place], [place] [address]  . Ferritin 01/30/2018 943* 11 - 307 ng/mL Final   Performed at [facility], [place] [address] [place], [place] [address]  . Iron 01/30/2018 56  28 - 170 ug/dL Final  . TIBC 01/30/2018 274  250 - 450 ug/dL Final  . Saturation Ratios 01/30/2018 20  10.4 - 31.8 % Final  . UIBC 01/30/2018 218  ug/dL Final   Performed at [facility], [place] [address] [place], [place] [address]     Pathology Orders Placed This Encounter  Procedures  . CBC with Differential/Platelet    Standing Status:   Standing    Number of Occurrences:   12    Standing Expiration Date:   02/01/2019  . CBC with Differential/Platelet    Standing Status:   Future    Standing Expiration Date:   02/01/2019  . Comprehensive metabolic panel    Standing Status:   Future    Standing Expiration Date:   02/01/2019  . Lactate dehydrogenase    Standing Status:   Future    Standing Expiration Date:   02/01/2019  . Protein electrophoresis, serum    Standing Status:    Future    Standing Expiration Date:   02/01/2019  . Ferritin    Standing Status:   Future    Standing Expiration Date:   02/01/2019       Zoila Shutter MD

## 2018-02-01 ENCOUNTER — Ambulatory Visit (INDEPENDENT_AMBULATORY_CARE_PROVIDER_SITE_OTHER): Payer: Medicare Other

## 2018-02-01 ENCOUNTER — Ambulatory Visit: Payer: BLUE CROSS/BLUE SHIELD

## 2018-02-01 VITALS — BP 118/74 | HR 83 | Resp 17 | Ht 67.0 in | Wt 208.0 lb

## 2018-02-01 DIAGNOSIS — Z0000 Encounter for general adult medical examination without abnormal findings: Secondary | ICD-10-CM | POA: Diagnosis not present

## 2018-02-01 MED ORDER — UNABLE TO FIND: 0 refills | Status: DC

## 2018-02-01 NOTE — Progress Notes (Signed)
Subjective:   [name] [name] is a 84 y.o. female who presents for Medicare Annual (Subsequent) preventive examination.  Review of Systems:           Objective:     Vitals: There were no vitals taken for this visit.  There is no height or weight on file to calculate BMI.  Advanced Directives 01/31/2018 01/30/2018 12/19/2017 12/10/2017 12/09/2017 11/15/2017 11/15/2017  Does Patient Have a Medical Advance Directive? No No No No No No No  Would patient like information on creating a medical advance directive? No - Patient declined No - Patient declined No - Patient declined No - Patient declined - No - Patient declined -  Pre-existing out of facility DNR order (yellow form or pink MOST form) - - - - - - -    Tobacco Social History   Tobacco Use  Smoking Status Former Smoker  . Packs/day: 0.75  . Years: 40.00  . Pack years: 30.00  . Types: Cigarettes  . Last attempt to quit: 03/08/1987  . Years since quitting: 30.9  Smokeless Tobacco Never Used     Counseling given: Not Answered   Clinical Intake:                       Past Medical History:  Diagnosis Date  . Adenomatous polyp of colon 12/16/2002   Dr. [name]/[facility]  . Allergy   . Cataract   . CHB (complete heart block) ([place]) 10/07/2014      . CHF (congestive heart failure) ([place])    a.  EF previously reduced to 25-30% b. EF improved to 60-65% by echo in 10/2017.   Marland Kitchen Chronic kidney disease, stage I 2006  . Constipation       . COPD (chronic obstructive pulmonary disease) ([place])       . DJD (degenerative joint disease) of lumbar spine   . DM (diabetes mellitus) ([place]) 2006  . Esophageal reflux   . Glaucoma   . Gout       . H/O heart valve replacement with mechanical valve    a. s/p mechanical AVR in 2001 and mechanical MVR in 2004.  Marland Kitchen Hyperlipemia   . IDA (iron deficiency anemia)    Parenteral iron/[name]  . Insomnia       . Non-ischemic cardiomyopathy ([place])    a. s/p MDT CRTD  . Obesity, unspecified   . Other and unspecified hyperlipidemia   . Oxygen deficiency 2014   nocturnal;  . Spondylolisthesis   . Spondylosis   . Thyroid cancer ([place])    remote thyroidectomy, no recurrence, pt denies in 2016 thyroid cancer  . Unspecified hypothyroidism       . 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 Vertigo        Past Surgical History:  Procedure Laterality Date  . AORTIC AND MITRAL VALVE REPLACEMENT  2001  . BI-VENTRICULAR IMPLANTABLE CARDIOVERTER DEFIBRILLATOR UPGRADE N/A 01/18/2015   Procedure: BI-VENTRICULAR IMPLANTABLE CARDIOVERTER DEFIBRILLATOR UPGRADE;  Surgeon: Evans Lance, MD;  Location: [facility] CATH LAB;  Service: Cardiovascular;  Laterality: N/A;  . CARDIAC CATHETERIZATION    . CARDIAC VALVE REPLACEMENT    . CARDIOVERSION N/A 11/13/2016   Procedure: CARDIOVERSION;  Surgeon: Sanda Klein, MD;  Location: [facility] ENDOSCOPY;  Service: Cardiovascular;  Laterality: N/A;  . COLONOSCOPY  06/12/2007   Rourk- Normal rectum/Normal colon  . COLONOSCOPY  12/16/02   small hemorrhoids  . COLONOSCOPY  06/20/2012  Procedure: COLONOSCOPY;  Surgeon: Daneil Dolin, MD;  Location: [facility] ENDO SUITE;  Service: Endoscopy;  Laterality: N/A;  10:45  . defibrillator implanted 2006    . DOPPLER ECHOCARDIOGRAPHY  2012  . DOPPLER ECHOCARDIOGRAPHY  05,06,07,08,09,2011  . ESOPHAGOGASTRODUODENOSCOPY  06/12/2007   Rourk- normal esophagus, small hiatal hernia, otherwise normal stomach, D1, D2  . EYE SURGERY Right 2005  . EYE SURGERY Left 2006  . ICD LEAD REMOVAL Left 02/08/2015   Procedure: ICD LEAD REMOVAL;  Surgeon: Evans Lance, MD;  Location: [facility] OR;  Service: Cardiovascular;  Laterality: Left;  "Will plan extraction and insertion of a BiV PM"  **Dr. Roxan Hockey backing up case**  . IMPLANTABLE CARDIOVERTER DEFIBRILLATOR (ICD) GENERATOR CHANGE Left 02/08/2015   Procedure: ICD GENERATOR CHANGE;  Surgeon: Evans Lance, MD;  Location: [facility];   Service: Cardiovascular;  Laterality: Left;  . INSERT / REPLACE / REMOVE PACEMAKER    . PACEMAKER INSERTION  May 2016  . pacemaker placed  2004  . right breast cyst removed     benign   . right cataract removed     2005  . TEE WITHOUT CARDIOVERSION N/A 11/13/2016   Procedure: TRANSESOPHAGEAL ECHOCARDIOGRAM (TEE);  Surgeon: Sanda Klein, MD;  Location[facility] ENDOSCOPY;  Service: Cardiovascular;  Laterality: N/A;  . THYROIDECTOMY     Family History  Problem Relation Age of Onset  . Pancreatic cancer Mother   . Heart disease Father   . Heart disease Sister   . Heart attack Sister   . Heart disease Brother   . Diabetes Brother   . Heart disease Brother   . Diabetes Brother   . Hypertension Brother   . Lung cancer Brother    Social History   Socioeconomic History  . Marital status: Widowed    Spouse name: Not on file  . Number of children: 0  . Years of education: Not on file  . Highest education level: Not on file  Occupational History  . Occupation: retired    Fish farm manager: RETIRED  Social Needs  . Financial resource strain: Not on file  . Food insecurity:    Worry: Not on file    Inability: Not on file  . Transportation needs:    Medical: Not on file    Non-medical: Not on file  Tobacco Use  . Smoking status: Former Smoker    Packs/day: 0.75    Years: 40.00    Pack years: 30.00    Types: Cigarettes    Last attempt to quit: 03/08/1987    Years since quitting: 30.9  . Smokeless tobacco: Never Used  Substance and Sexual Activity  . Alcohol use: No    Alcohol/week: 0.0 oz  . Drug use: No  . Sexual activity: Not Currently  Lifestyle  . Physical activity:    Days per week: Not on file    Minutes per session: Not on file  . Stress: Not on file  Relationships  . Social connections:    Talks on phone: Not on file    Gets together: Not on file    Attends religious service: Not on file    Active member of club or organization: Not on file    Attends meetings of clubs  or organizations: Not on file    Relationship status: Not on file  Other Topics Concern  . Not on file  Social History Narrative   From [place] (2004)    Outpatient Encounter Medications as of 02/01/2018  Medication Sig  .  acetaminophen (TYLENOL) 500 MG tablet Take 500 mg by mouth every 6 (six) hours as needed (pain).  Marland Kitchen albuterol (PROVENTIL) (2.5 MG/3ML) 0.083% nebulizer solution INHALE 1 VIAL VIA NEBULIZER THREE TIMES DAILY. (Patient taking differently: INHALE 1 VIAL VIA NEBULIZER THREE TIMES DAILY AS NEEDED)  . albuterol (VENTOLIN HFA) 108 (90 Base) MCG/ACT inhaler Inhale 2 puffs into the lungs every 6 (six) hours as needed for wheezing or shortness of breath.  . allopurinol (ZYLOPRIM) 100 MG tablet Take 1 tablet (100 mg total) by mouth daily.  Marland Kitchen azelastine (ASTELIN) 0.1 % nasal spray Place 2 sprays into both nostrils daily as needed for allergies. Use in each nostril as directed  . BREO ELLIPTA 100-25 MCG/INH AEPB Inhale 1 puff into the lungs daily.   . calcium carbonate (OS-CAL) 1250 (500 Ca) MG chewable tablet Chew 1 tablet by mouth daily.  . cholecalciferol (VITAMIN D) 1000 UNITS tablet Take 1,000 Units by mouth daily.  Marland Kitchen COUMADIN 5 MG tablet Take 1 tablet Tuesday, Thursday, and Saturday and  Take 2 tablets all other days.  . Darbepoetin Alfa (ARANESP, ALBUMIN FREE, IJ) EVERY THURSDAY  . digoxin (LANOXIN) 0.125 MG tablet Take 1 tablet (0.125 mg total) by mouth every other day.  . docusate sodium (COLACE) 100 MG capsule Take 300 mg by mouth at bedtime.  . fluticasone (FLONASE) 50 MCG/ACT nasal spray Place 2 sprays into both nostrils daily. Pt needs now  . folic acid (FOLVITE) 1 MG tablet TAKE 1 TABLET BY MOUTH ONCE DAILY  . furosemide (LASIX) 40 MG tablet Take 1 tablet by mouth  daily  . gabapentin (NEURONTIN) 300 MG capsule Take 1 capsule (300 mg total) by mouth at bedtime.  Marland Kitchen guaiFENesin (MUCINEX) 600 MG 12 hr tablet Take 1 tablet (600 mg total) by mouth 2 (two) times daily.  .  hydrALAZINE (APRESOLINE) 25 MG tablet Take 1 tablet (25 mg total) by mouth 3 (three) times daily.  Marland Kitchen HYDROcodone-acetaminophen (NORCO) 7.5-325 MG tablet Take 1 tablet by mouth every 6 (six) hours as needed for moderate pain (Must last 30 days.).  Marland Kitchen isosorbide mononitrate (IMDUR) 60 MG 24 hr tablet Take 1 tablet (60 mg total) by mouth daily.  . meclizine (ANTIVERT) 25 MG tablet Take 1 tablet (25 mg total) by mouth 3 (three) times daily as needed for dizziness.  . metolazone (ZAROXOLYN) 2.5 MG tablet TAKE 1 TABLET ONCE A WEEK (Patient taking differently: Take 2.5 mg by mouth. TAKE 1 TABLET ONCE A WEEK (Wednesday))  . metoprolol succinate (TOPROL XL) 100 MG 24 hr tablet Take 1 tablet (100 mg total) by mouth daily. Take with or immediately following a meal.  . Multiple Vitamin (MULTIVITAMIN) LIQD Take 5 mLs by mouth daily.  . ondansetron (ZOFRAN) 4 MG tablet Take 1 tablet (4 mg total) by mouth daily as needed for nausea or vomiting.  [name] [name] (SYSTANE OP) Place 1 drop into both eyes daily as needed (dry eyes).  . potassium chloride SA (KLOR-CON M20) 20 MEQ tablet Take 1 tablet by mouth  daily  . pravastatin (PRAVACHOL) 40 MG tablet Take 1 tablet (40 mg total) by mouth every evening.  Marland Kitchen SYNTHROID 75 MCG tablet Take 1 tablet by mouth  daily   Facility-Administered Encounter Medications as of 02/01/2018  Medication  . epoetin alfa (EPOGEN,PROCRIT) injection 40,000 Units    Activities of Daily Living In your present state of health, do you have any difficulty performing the following activities: 12/18/2017 12/10/2017  Hearing? N N  Vision?  N N  Difficulty concentrating or making decisions? N N  Walking or climbing stairs? Y Y  Dressing or bathing? N N  Doing errands, shopping? N N  Preparing Food and eating ? N -  Using the Toilet? N -  In the past six months, have you accidently leaked urine? N -  Do you have problems with loss of bowel control? N -  Managing your  Medications? N -  Managing your Finances? N -  Housekeeping or managing your Housekeeping? N -  Some recent data might be hidden    Patient Care Team: Fayrene Helper, MD as PCP - General (Family Medicine) Croitoru, Dani Gobble, MD as PCP - Cardiology (Cardiology) Evans Lance, MD as Consulting Physician (Cardiology) Sheldon Silvan, Manon Hilding, PA-C as Physician Assistant (Oncology) Madelin Headings, DO (Optometry) Fran Lowes, MD as Consulting Physician (Nephrology) Nickel, Sharmon Leyden, NP as Nurse Practitioner (Vascular Surgery) Sanjuana Kava, MD as Consulting Physician (Orthopedic Surgery) Herminio Commons, MD as Attending Physician (Cardiology) Early, Arvilla Meres, MD as Consulting Physician (Vascular Surgery) Clista Bernhardt, MD as Consulting Physician [facility]) Sanda Klein, MD as Consulting Physician (Cardiology) System, Provider Not In    Assessment:   This is a routine wellness examination for [place].  Exercise Activities and Dietary recommendations    Goals    . Exercise 3x per week (30 min per time)     Recommend starting a routine exercise program at least 3 days a week for 30-45 minutes at a time as tolerated.         Fall Risk Fall Risk  12/18/2017 06/28/2017 04/26/2017 03/15/2017 03/12/2017  Falls in the past year? No No No No No  Number falls in past yr: - - - - -  Injury with Fall? - - - - -  Risk for fall due to : Impaired balance/gait;Impaired mobility;Medication side effect;Impaired vision - Impaired balance/gait;Impaired mobility;Medication side effect Impaired mobility;Medication side effect;Impaired balance/gait Impaired balance/gait;Impaired mobility  Follow up - - - - -   Is the patient's home free of loose throw rugs in walkways, pet beds, electrical cords, etc?   yes      Grab bars in the bathroom? yes      Handrails on the stairs?   yes      Adequate lighting?   yes  Timed Get Up and Go performed:   Depression Screen PHQ 2/9  Scores 12/18/2017 12/18/2017 06/28/2017 04/26/2017  PHQ - 2 Score 0 0 0 0  PHQ- 9 Score - - - -     Cognitive Function     6CIT Screen 03/12/2017  What Year? 0 points  What month? 0 points  What time? 0 points  Count back from 20 0 points  Months in reverse 0 points  Repeat phrase 0 points  Total Score 0    Immunization History  Administered Date(s) Administered  . Influenza Split 06/19/2011, 07/17/2012  . Influenza Whole 06/20/2007, 06/22/2008, 06/24/2009, 05/27/2010  . Influenza,inj,Quad PF,6+ Mos 07/15/2013, 06/03/2014, 06/21/2015, 07/06/2016, 11/17/2016, 06/29/2017  . Pneumococcal Conjugate-13 10/13/2014  . Pneumococcal Polysaccharide-23 06/10/2008  . Td 08/31/2004  . Tdap 02/17/2015    Qualifies for Shingles Vaccine? Discuss at next appt   Screening Tests Health Maintenance  Topic Date Due  . OPHTHALMOLOGY EXAM  07/03/2017  . HEMOGLOBIN A1C  12/27/2017  . INFLUENZA VACCINE  04/25/2018  . URINE MICROALBUMIN  06/28/2018  . FOOT EXAM  06/29/2018  . TETANUS/TDAP  02/16/2025  . DEXA SCAN  Completed  .  PNA vac Low Risk Adult  Completed    Cancer Screenings: Lung: Low Dose CT Chest recommended if Age 69-80 years, 30 pack-year currently smoking OR have quit w/in 15years. Patient does not qualify. Breast:  Up to date on Mammogram? No   Up to date of Bone Density/Dexa? yes Colorectal: Up to date         Plan:    I have personally reviewed and noted the following in the patient's chart:   . Medical and social history . Use of alcohol, tobacco or illicit drugs  . Current medications and supplements . Functional ability and status . Nutritional status . Physical activity . Advanced directives . List of other physicians . Hospitalizations, surgeries, and ER visits in previous 12 months . Vitals . Screenings to include cognitive, depression, and falls . Referrals and appointments  In addition, I have reviewed and discussed with patient certain preventive  protocols, quality metrics, and best practice recommendations. A written personalized care plan for preventive services as well as general preventive health recommendations were provided to patient.     Kate Sable, LPN, LPN  [id number]

## 2018-02-01 NOTE — Patient Instructions (Addendum)
[name] [name] , Thank you for taking time to come for your Medicare Wellness Visit. I appreciate your ongoing commitment to your health goals. Please review the following plan we discussed and let me know if I can assist you in the future.   Screening recommendations/referrals: Colonoscopy: Up to date Mammogram: Due  Bone Density: Up to date  Recommended yearly ophthalmology/optometry visit for glaucoma screening and checkup Recommended yearly dental visit for hygiene and checkup  Vaccinations: Influenza vaccine: Complete Pneumococcal vaccine: Complete Tdap vaccine: Check with insurance Shingles vaccine: Check with insurance    Advanced directives: form given   Conditions/risks identified: done   Next appointment: to be scheduled    Preventive Care 82 Years and Older, Female Preventive care refers to lifestyle choices and visits with your health care provider that can promote health and wellness. What does preventive care include?  A yearly physical exam. This is also called an annual well check.  Dental exams once or twice a year.  Routine eye exams. Ask your health care provider how often you should have your eyes checked.  Personal lifestyle choices, including:  Daily care of your teeth and gums.  Regular physical activity.  Eating a healthy diet.  Avoiding tobacco and drug use.  Limiting alcohol use.  Practicing safe sex.  Taking low-dose aspirin every day.  Taking vitamin and mineral supplements as recommended by your health care provider. What happens during an annual well check? The services and screenings done by your health care provider during your annual well check will depend on your age, overall health, lifestyle risk factors, and family history of disease. Counseling  Your health care provider may ask you questions about your:  Alcohol use.  Tobacco use.  Drug use.  Emotional well-being.  Home and relationship well-being.  Sexual  activity.  Eating habits.  History of falls.  Memory and ability to understand (cognition).  Work and work Statistician.  Reproductive health. Screening  You may have the following tests or measurements:  Height, weight, and BMI.  Blood pressure.  Lipid and cholesterol levels. These may be checked every 5 years, or more frequently if you are over 7 years old.  Skin check.  Lung cancer screening. You may have this screening every year starting at age 82 if you have a 30-pack-year history of smoking and currently smoke or have quit within the past 15 years.  Fecal occult blood test (FOBT) of the stool. You may have this test every year starting at age 82.  Flexible sigmoidoscopy or colonoscopy. You may have a sigmoidoscopy every 5 years or a colonoscopy every 10 years starting at age 82.  Hepatitis C blood test.  Hepatitis B blood test.  Sexually transmitted disease (STD) testing.  Diabetes screening. This is done by checking your blood sugar (glucose) after you have not eaten for a while (fasting). You may have this done every 1-3 years.  Bone density scan. This is done to screen for osteoporosis. You may have this done starting at age 82.  Mammogram. This may be done every 1-2 years. Talk to your health care provider about how often you should have regular mammograms. Talk with your health care provider about your test results, treatment options, and if necessary, the need for more tests. Vaccines  Your health care provider may recommend certain vaccines, such as:  Influenza vaccine. This is recommended every year.  Tetanus, diphtheria, and acellular pertussis (Tdap, Td) vaccine. You may need a Td booster every 10 years.  Zoster vaccine. You may need this after age 82.  Pneumococcal 13-valent conjugate (PCV13) vaccine. One dose is recommended after age 82.  Pneumococcal polysaccharide (PPSV23) vaccine. One dose is recommended after age 82. Talk to your health care  provider about which screenings and vaccines you need and how often you need them. This information is not intended to replace advice given to you by your health care provider. Make sure you discuss any questions you have with your health care provider. Document Released: 10/08/2015 Document Revised: 05/31/2016 Document Reviewed: 07/13/2015 Elsevier Interactive Patient Education  2017 [place] Prevention in the Home Falls can cause injuries. They can happen to people of all ages. There are many things you can do to make your home safe and to help prevent falls. What can I do on the outside of my home?  Regularly fix the edges of walkways and driveways and fix any cracks.  Remove anything that might make you trip as you walk through a door, such as a raised step or threshold.  Trim any bushes or trees on the path to your home.  Use bright outdoor lighting.  Clear any walking paths of anything that might make someone trip, such as rocks or tools.  Regularly check to see if handrails are loose or broken. Make sure that both sides of any steps have handrails.  Any raised decks and porches should have guardrails on the edges.  Have any leaves, snow, or ice cleared regularly.  Use sand or salt on walking paths during winter.  Clean up any spills in your garage right away. This includes oil or grease spills. What can I do in the bathroom?  Use night lights.  Install grab bars by the toilet and in the tub and shower. Do not use towel bars as grab bars.  Use non-skid mats or decals in the tub or shower.  If you need to sit down in the shower, use a plastic, non-slip stool.  Keep the floor dry. Clean up any water that spills on the floor as soon as it happens.  Remove soap buildup in the tub or shower regularly.  Attach bath mats securely with double-sided non-slip rug tape.  Do not have throw rugs and other things on the floor that can make you trip. What can I do in  the bedroom?  Use night lights.  Make sure that you have a light by your bed that is easy to reach.  Do not use any sheets or blankets that are too big for your bed. They should not hang down onto the floor.  Have a firm chair that has side arms. You can use this for support while you get dressed.  Do not have throw rugs and other things on the floor that can make you trip. What can I do in the kitchen?  Clean up any spills right away.  Avoid walking on wet floors.  Keep items that you use a lot in easy-to-reach places.  If you need to reach something above you, use a strong step stool that has a grab bar.  Keep electrical cords out of the way.  Do not use floor polish or wax that makes floors slippery. If you must use wax, use non-skid floor wax.  Do not have throw rugs and other things on the floor that can make you trip. What can I do with my stairs?  Do not leave any items on the stairs.  Make sure that there are  handrails on both sides of the stairs and use them. Fix handrails that are broken or loose. Make sure that handrails are as long as the stairways.  Check any carpeting to make sure that it is firmly attached to the stairs. Fix any carpet that is loose or worn.  Avoid having throw rugs at the top or bottom of the stairs. If you do have throw rugs, attach them to the floor with carpet tape.  Make sure that you have a light switch at the top of the stairs and the bottom of the stairs. If you do not have them, ask someone to add them for you. What else can I do to help prevent falls?  Wear shoes that:  Do not have high heels.  Have rubber bottoms.  Are comfortable and fit you well.  Are closed at the toe. Do not wear sandals.  If you use a stepladder:  Make sure that it is fully opened. Do not climb a closed stepladder.  Make sure that both sides of the stepladder are locked into place.  Ask someone to hold it for you, if possible.  Clearly mark and  make sure that you can see:  Any grab bars or handrails.  First and last steps.  Where the edge of each step is.  Use tools that help you move around (mobility aids) if they are needed. These include:  Canes.  Walkers.  Scooters.  Crutches.  Turn on the lights when you go into a dark area. Replace any light bulbs as soon as they burn out.  Set up your furniture so you have a clear path. Avoid moving your furniture around.  If any of your floors are uneven, fix them.  If there are any pets around you, be aware of where they are.  Review your medicines with your doctor. Some medicines can make you feel dizzy. This can increase your chance of falling. Ask your doctor what other things that you can do to help prevent falls. This information is not intended to replace advice given to you by your health care provider. Make sure you discuss any questions you have with your health care provider. Document Released: 07/08/2009 Document Revised: 02/17/2016 Document Reviewed: 10/16/2014 Elsevier Interactive Patient Education  2017 [employer].

## 2018-02-02 IMAGING — CT CT HEAD W/O CM
1 series · 15 of 30 positions shown, 19 images · non-contrast
Comparison: 06/25/2015 head CT.

CLINICAL DATA: 85-year-old female with a traumatic optic
neuropathy. Exophthalmos. Hit left eye with tomato several years
ago. Three weeks ago hit left eye with hanger. Headaches, dizziness,
floaters and blurred vision. Initial encounter.

EXAM:
CT HEAD AND ORBITS WITHOUT CONTRAST
TECHNIQUE: Contiguous axial images were obtained from the base of the skull
through the vertex without contrast. Multidetector CT imaging of the
orbits was performed using the standard protocol without intravenous
contrast.

[Series 2: head w/(date) · axial · 0.41mm/px · z∈[-172,-37]mm · 15 of 30 slices shown, 19 images]
[im 2/30  brain]
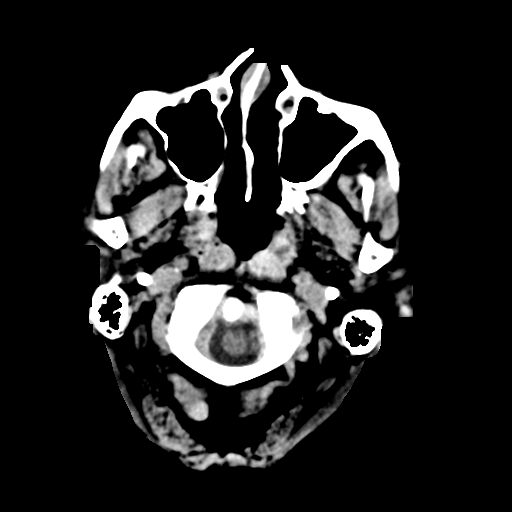
[im 2/30  bone]
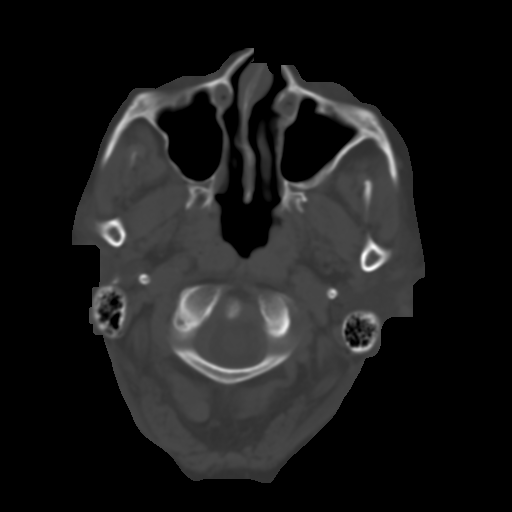
[im 4/30  brain]
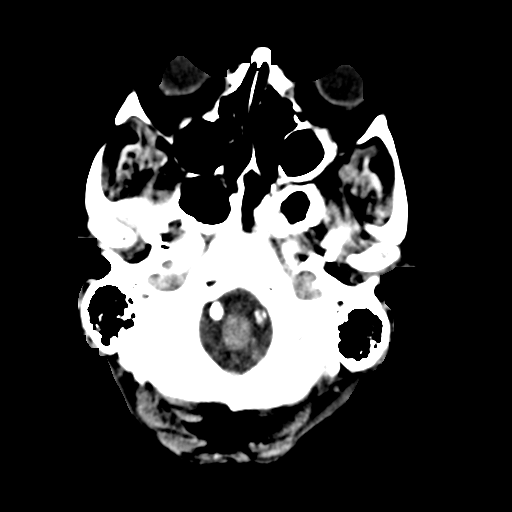
[im 6/30  brain]
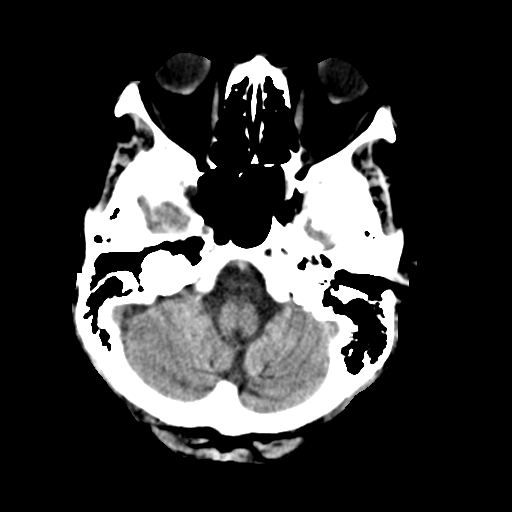
[im 8/30  brain]
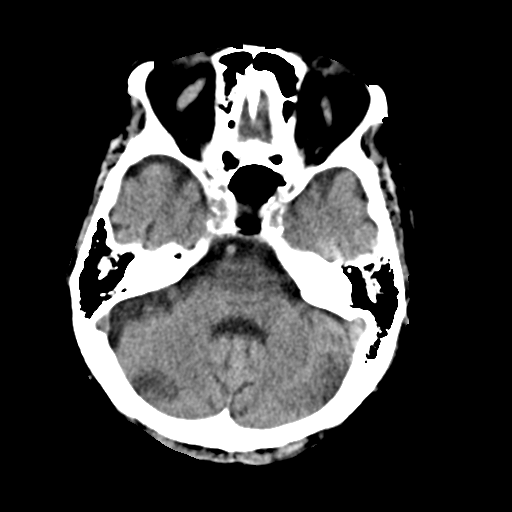
[im 10/30  brain]
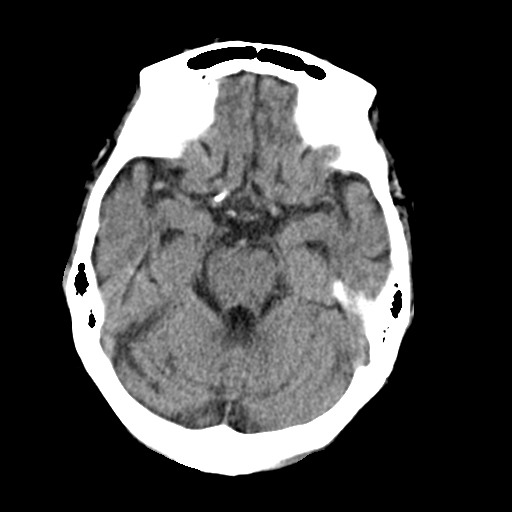
[im 10/30  bone]
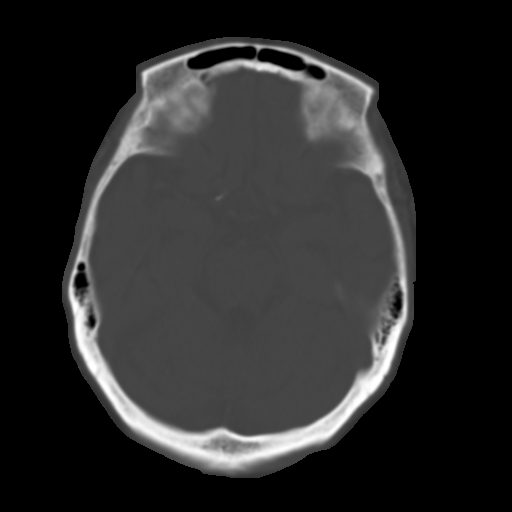
[im 12/30  brain]
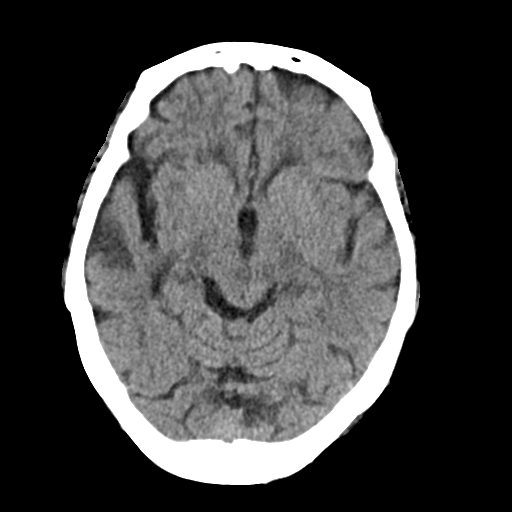
[im 14/30  brain]
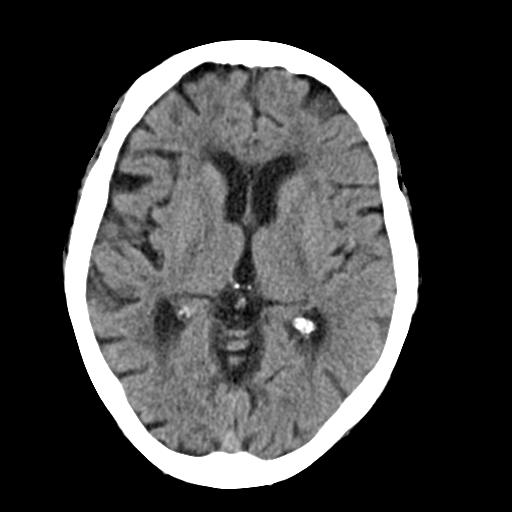
[im 16/30  brain]
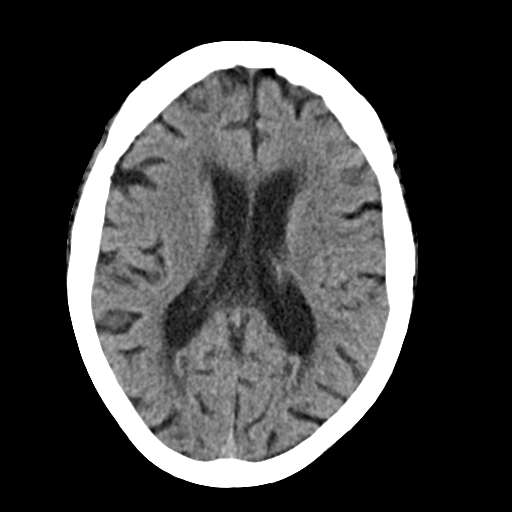
[im 17/30  brain]
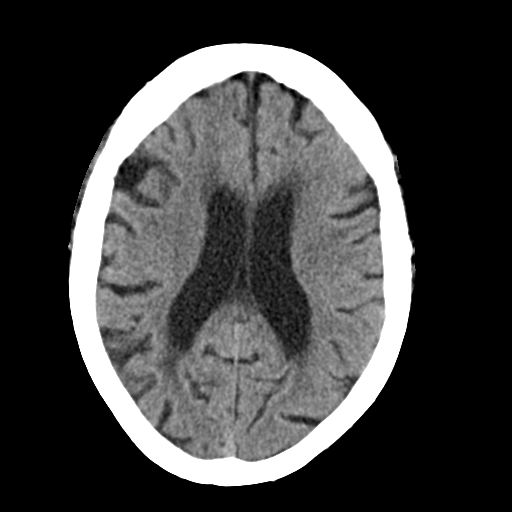
[im 17/30  bone]
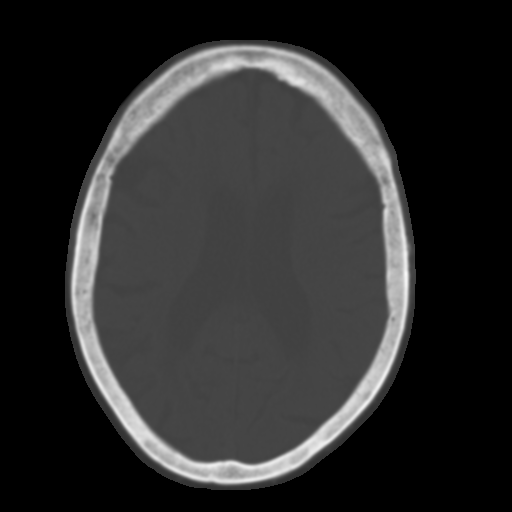
[im 19/30  brain]
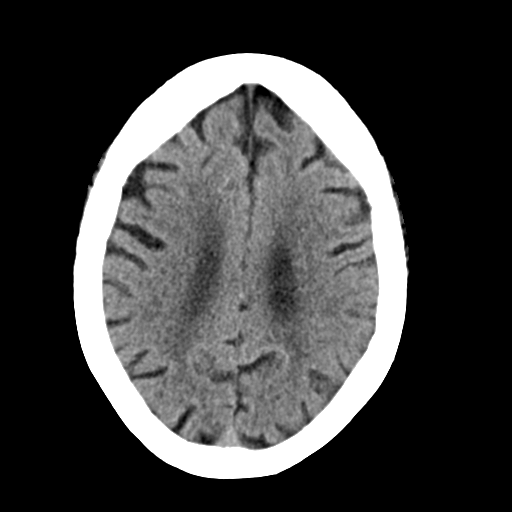
[im 21/30  brain]
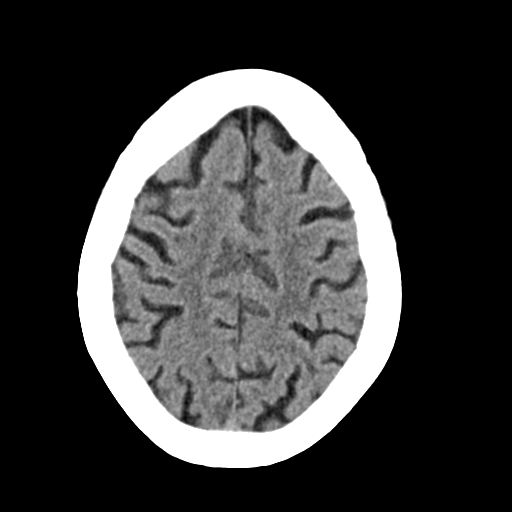
[im 23/30  brain]
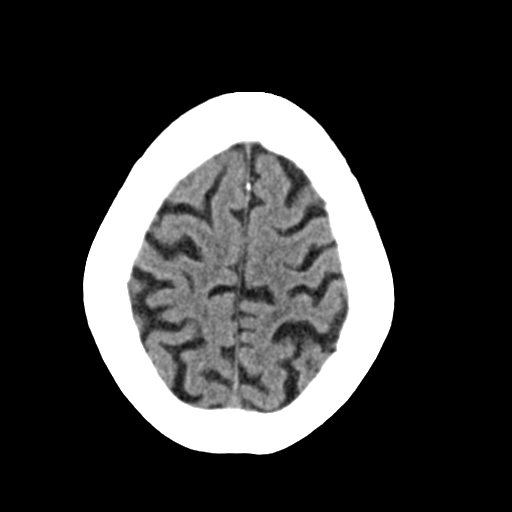
[im 25/30  brain]
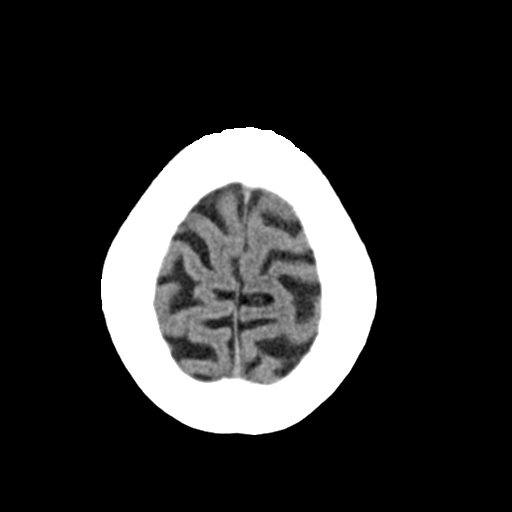
[im 25/30  bone]
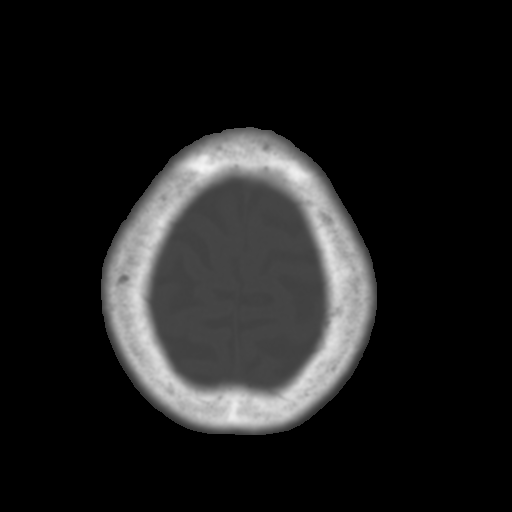
[im 27/30  brain]
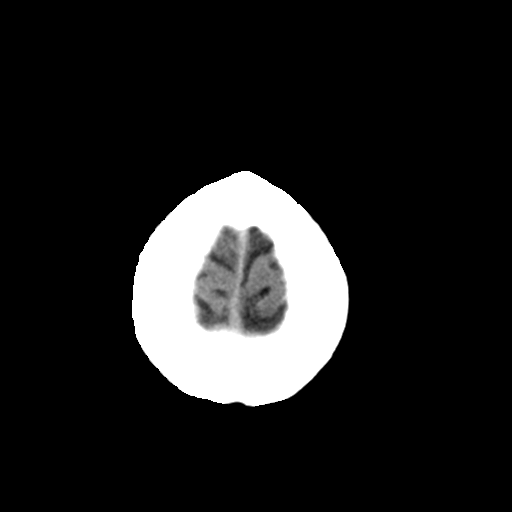
[im 29/30  brain]
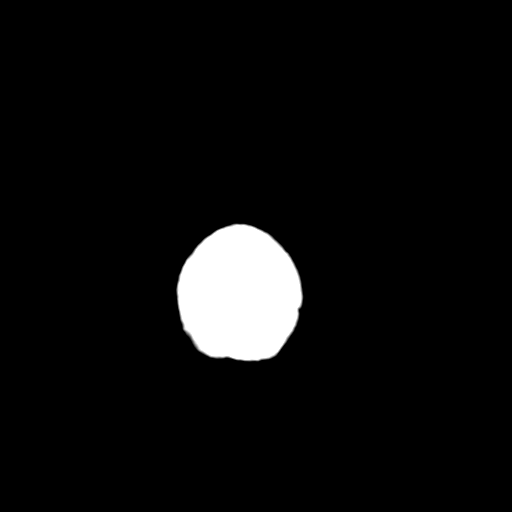

[15 of 30 positions shown; findings below may reference images not displayed]

FINDINGS: CT HEAD FINDINGS

Brain: No intracranial hemorrhage or CT evidence of large acute
infarct.

Moderate chronic microvascular changes.

Global atrophy without hydrocephalus.

No intracranial mass lesion noted on this unenhanced exam.

Vascular: Vascular calcifications.

Skull: No skull fracture.

Other: As below.

CT ORBITS FINDINGS

Orbits: Exophthalmos as previously noted. Globes appear to be
grossly intact however, along the superior margin of the left globe
there is a nonspecific low-density 2 x 1.5 x 0.8 cm collection. In
retrospect, this appears to be present on prior CTs and therefore
probably unrelated to recent injury although possibly related to
remote injury. Orbital structures otherwise unremarkable.

Visualized sinuses: Mucosal thickening left maxillary sinus without
fracture noted. Mild mucosal thickening left sphenoid sinus. Partial
opacification anterior right ethmoid sinus air cell.

Soft tissues: Negative.
IMPRESSION: No acute intracranial abnormality.

Chronic microvascular changes and atrophy.

Exophthalmos without change.

Globes appear to be grossly intact however, along the superior
margin of the left globe there is a nonspecific low-density 2 x
x 0.8 cm collection (inseparable from the superior margin of the
globe). In retrospect, this appears to be present on prior CTs and
therefore probably unrelated to recent injury although possibly
related to remote injury.

Mucosal thickening left maxillary sinus without fracture noted.

## 2018-02-02 IMAGING — CT CT ORBITS W/O CM
1 series · 15 of 30 positions shown, 19 images · non-contrast
Comparison: 06/25/2015 head CT.

CLINICAL DATA: 85-year-old female with a traumatic optic
neuropathy. Exophthalmos. Hit left eye with tomato several years
ago. Three weeks ago hit left eye with hanger. Headaches, dizziness,
floaters and blurred vision. Initial encounter.

EXAM:
CT HEAD AND ORBITS WITHOUT CONTRAST
TECHNIQUE: Contiguous axial images were obtained from the base of the skull
through the vertex without contrast. Multidetector CT imaging of the
orbits was performed using the standard protocol without intravenous
contrast.

[Series 4: orbits-soft · axial · 0.32mm/px · z∈[-149,-68]mm · 15 of 30 slices shown, 19 images]
[im 2/30  brain]
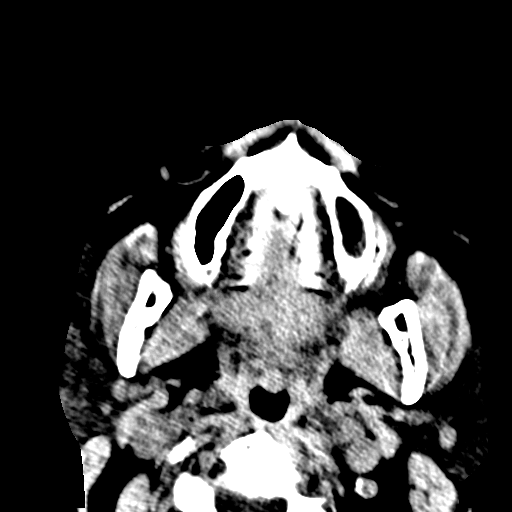
[im 2/30  bone]
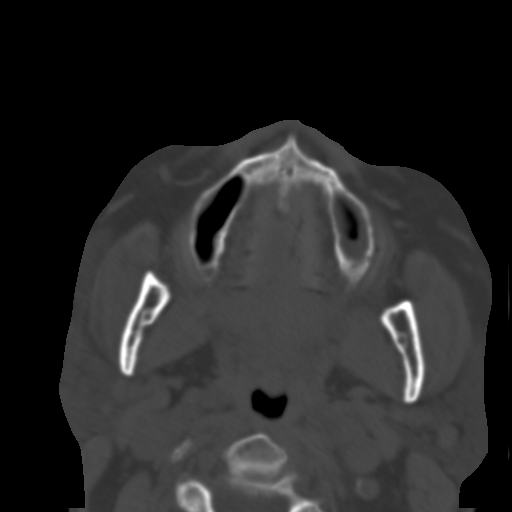
[im 4/30  bone]
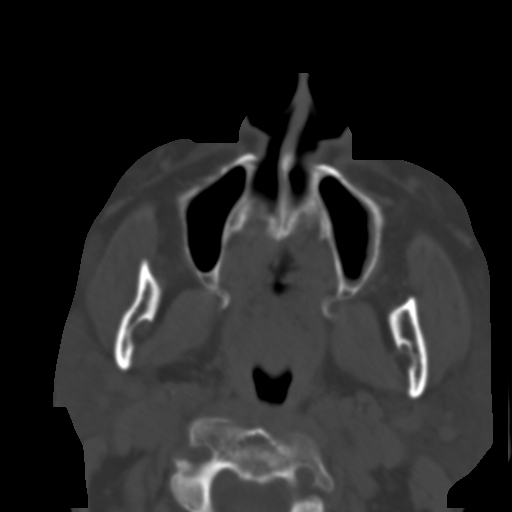
[im 6/30  bone]
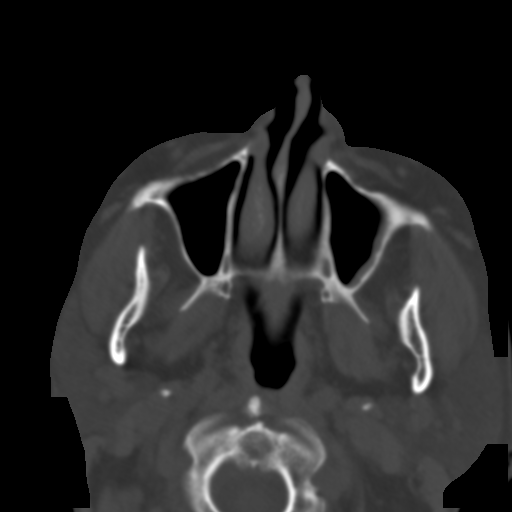
[im 8/30  bone]
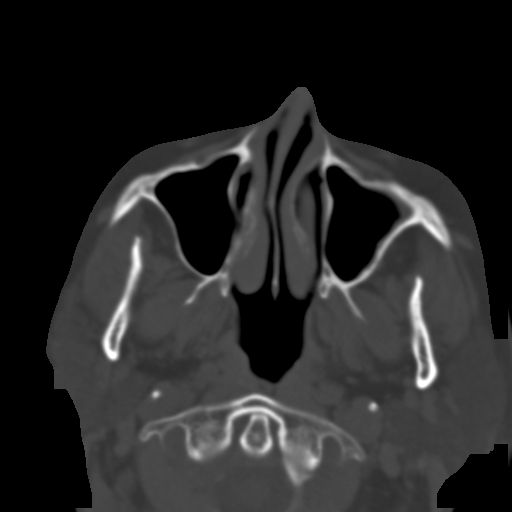
[im 10/30  brain]
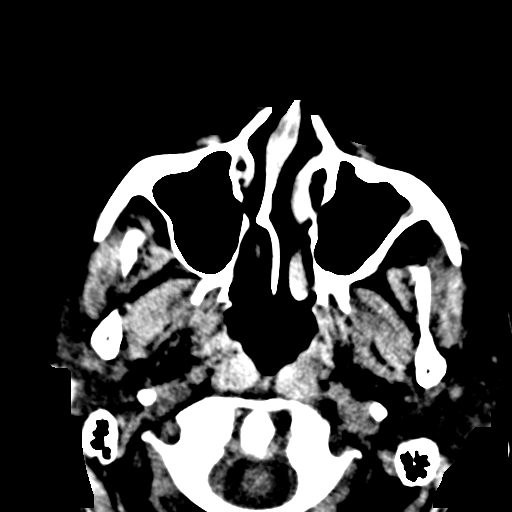
[im 10/30  bone]
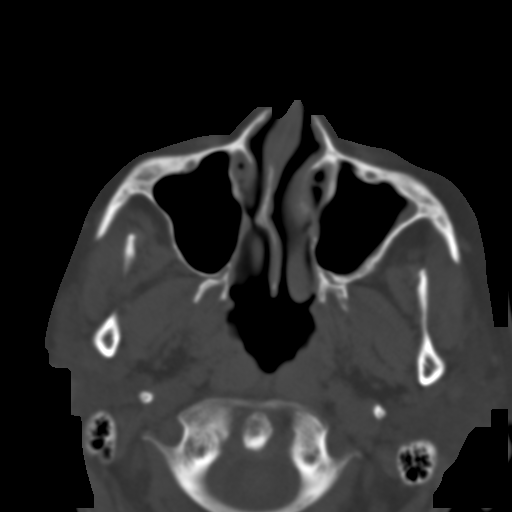
[im 12/30  bone]
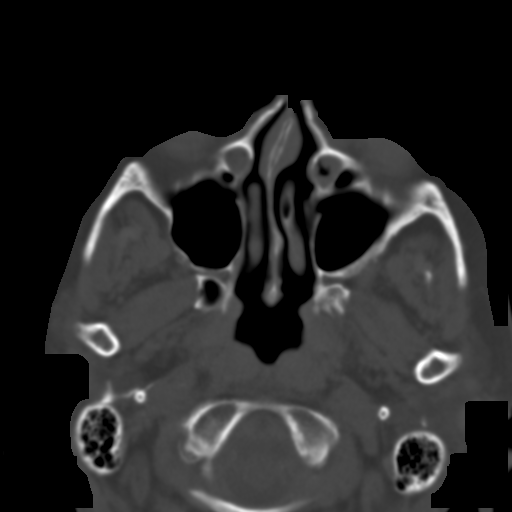
[im 14/30  bone]
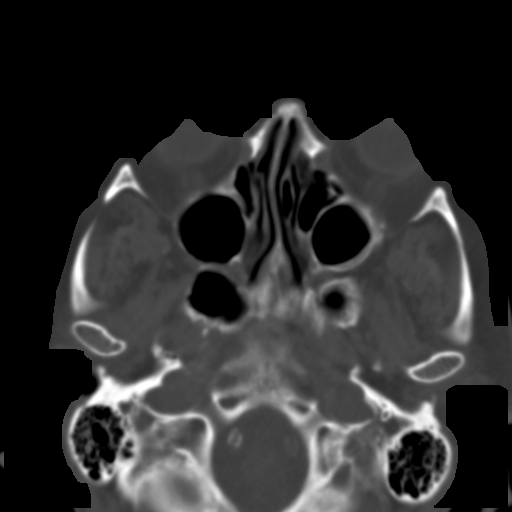
[im 16/30  bone]
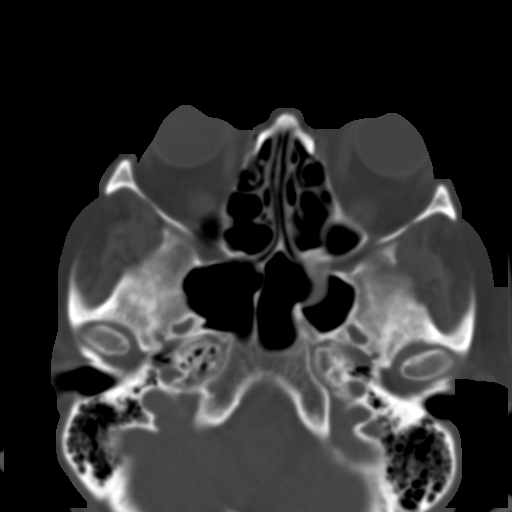
[im 17/30  brain]
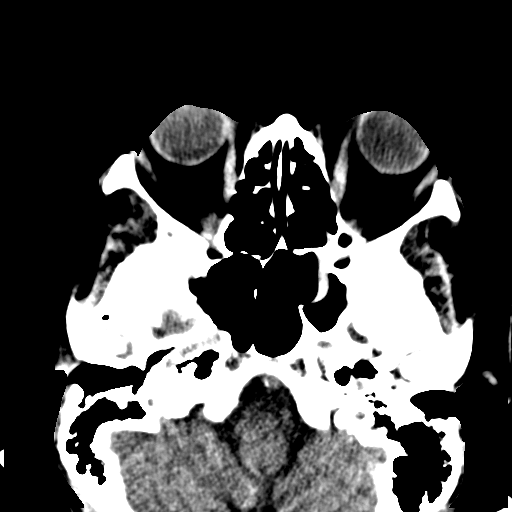
[im 17/30  bone]
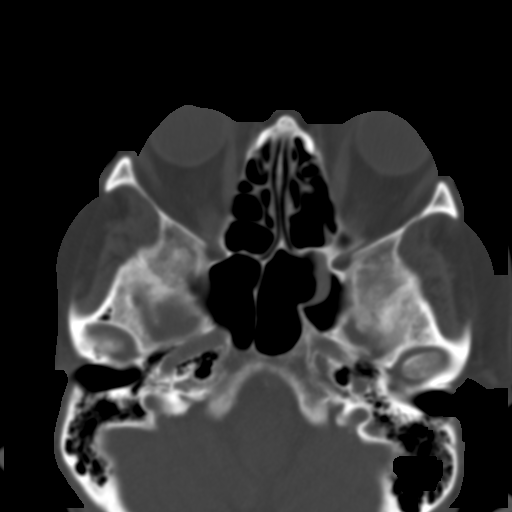
[im 19/30  bone]
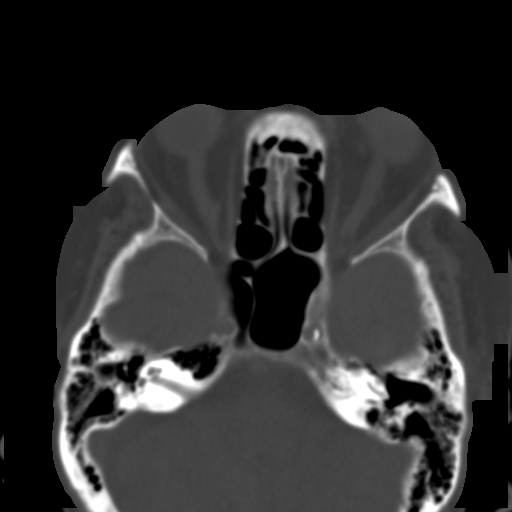
[im 21/30  bone]
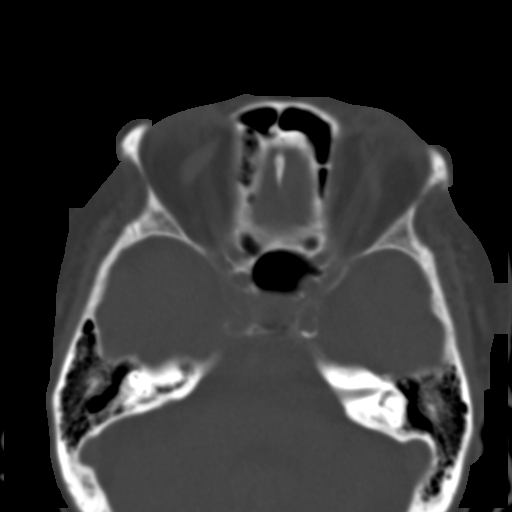
[im 23/30  bone]
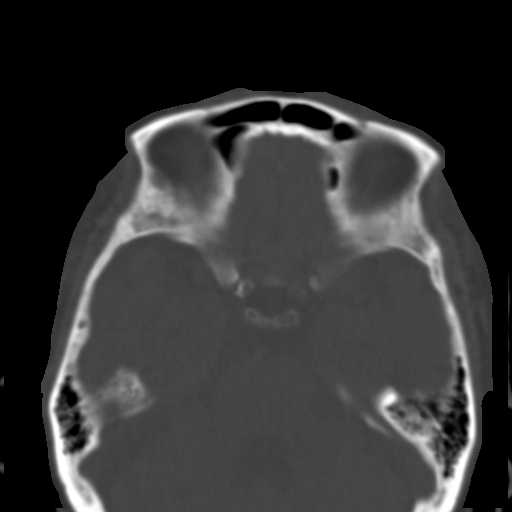
[im 25/30  brain]
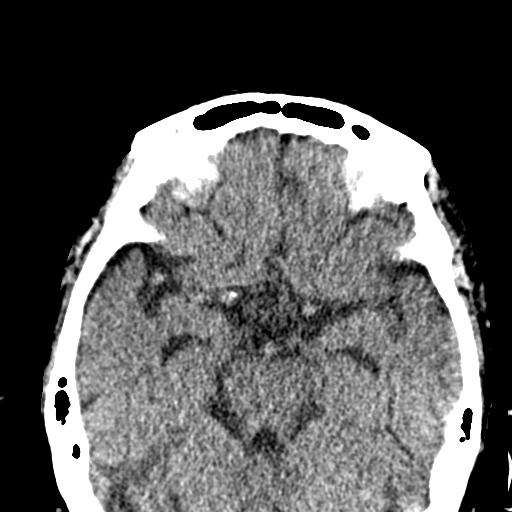
[im 25/30  bone]
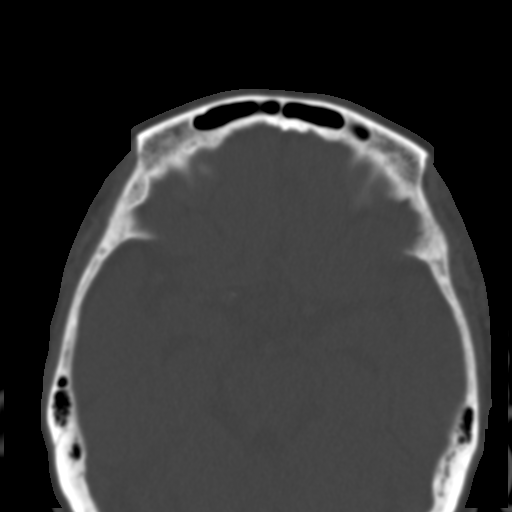
[im 27/30  bone]
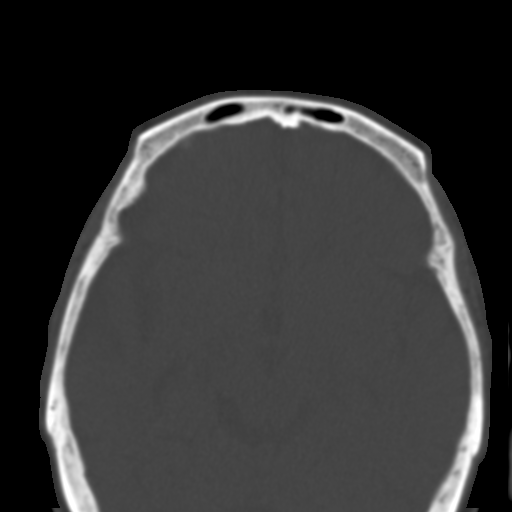
[im 29/30  bone]
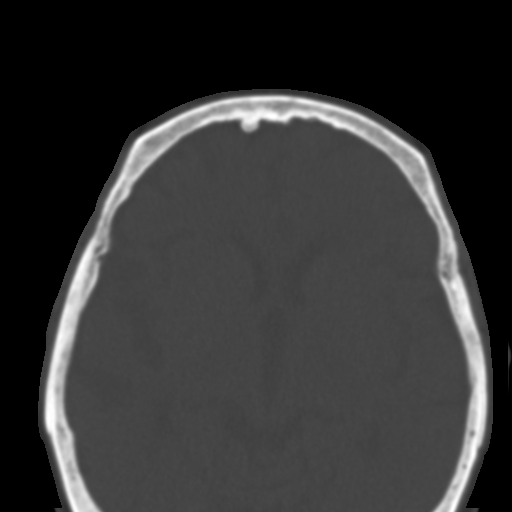

[15 of 30 positions shown; findings below may reference images not displayed]

FINDINGS: CT HEAD FINDINGS

Brain: No intracranial hemorrhage or CT evidence of large acute
infarct.

Moderate chronic microvascular changes.

Global atrophy without hydrocephalus.

No intracranial mass lesion noted on this unenhanced exam.

Vascular: Vascular calcifications.

Skull: No skull fracture.

Other: As below.

CT ORBITS FINDINGS

Orbits: Exophthalmos as previously noted. Globes appear to be
grossly intact however, along the superior margin of the left globe
there is a nonspecific low-density 2 x 1.5 x 0.8 cm collection. In
retrospect, this appears to be present on prior CTs and therefore
probably unrelated to recent injury although possibly related to
remote injury. Orbital structures otherwise unremarkable.

Visualized sinuses: Mucosal thickening left maxillary sinus without
fracture noted. Mild mucosal thickening left sphenoid sinus. Partial
opacification anterior right ethmoid sinus air cell.

Soft tissues: Negative.
IMPRESSION: No acute intracranial abnormality.

Chronic microvascular changes and atrophy.

Exophthalmos without change.

Globes appear to be grossly intact however, along the superior
margin of the left globe there is a nonspecific low-density 2 x
x 0.8 cm collection (inseparable from the superior margin of the
globe). In retrospect, this appears to be present on prior CTs and
therefore probably unrelated to recent injury although possibly
related to remote injury.

Mucosal thickening left maxillary sinus without fracture noted.

## 2018-02-13 ENCOUNTER — Ambulatory Visit (INDEPENDENT_AMBULATORY_CARE_PROVIDER_SITE_OTHER): Payer: Medicare Other | Admitting: *Deleted

## 2018-02-13 DIAGNOSIS — I481 Persistent atrial fibrillation: Secondary | ICD-10-CM | POA: Diagnosis not present

## 2018-02-13 DIAGNOSIS — Z5181 Encounter for therapeutic drug level monitoring: Secondary | ICD-10-CM

## 2018-02-13 DIAGNOSIS — I4819 Other persistent atrial fibrillation: Secondary | ICD-10-CM

## 2018-02-13 DIAGNOSIS — Z952 Presence of prosthetic heart valve: Secondary | ICD-10-CM | POA: Diagnosis not present

## 2018-02-13 LAB — POCT INR: INR: 2 (ref 2.0–3.0)

## 2018-02-13 NOTE — Patient Instructions (Signed)
Continue coumadin 2 tablets daily except 1 tablet on Tuesdays, Thursdays and Saturdays.  Recheck in 5 weeks.

## 2018-02-25 ENCOUNTER — Other Ambulatory Visit: Payer: Self-pay

## 2018-02-25 ENCOUNTER — Telehealth: Payer: Self-pay | Admitting: Family Medicine

## 2018-02-25 MED ORDER — METOLAZONE 2.5 MG PO TABS: ORAL_TABLET | ORAL | 2 refills | Status: DC

## 2018-02-25 MED ORDER — ONDANSETRON HCL 4 MG PO TABS: 4.0000 mg | ORAL_TABLET | Freq: Every day | ORAL | 1 refills | Status: DC | PRN

## 2018-02-25 MED ORDER — POTASSIUM CHLORIDE CRYS ER 20 MEQ PO TBCR: EXTENDED_RELEASE_TABLET | ORAL | 1 refills | Status: DC

## 2018-02-25 NOTE — Telephone Encounter (Signed)
Refills sent in

## 2018-02-25 NOTE — Telephone Encounter (Signed)
Pt is calling in needing Zofran, Zaroxolyn, Klor con--Please call into Mail Order [phone]

## 2018-02-26 ENCOUNTER — Ambulatory Visit (INDEPENDENT_AMBULATORY_CARE_PROVIDER_SITE_OTHER): Payer: Medicare Other | Admitting: Family

## 2018-02-26 ENCOUNTER — Ambulatory Visit (HOSPITAL_COMMUNITY)
Admission: RE | Admit: 2018-02-26 | Discharge: 2018-02-26 | Disposition: A | Discharge status: Home / Self Care | Payer: Medicare Other | Source: Ambulatory Visit | Attending: Family | Admitting: Family

## 2018-02-26 ENCOUNTER — Encounter: Payer: Self-pay | Admitting: Family

## 2018-02-26 VITALS — BP 144/77 | HR 58 | Temp 98.2°F | Resp 18 | Ht 67.0 in | Wt 207.0 lb

## 2018-02-26 DIAGNOSIS — I714 Abdominal aortic aneurysm, without rupture, unspecified: Secondary | ICD-10-CM

## 2018-02-26 DIAGNOSIS — I255 Ischemic cardiomyopathy: Secondary | ICD-10-CM | POA: Diagnosis not present

## 2018-02-26 NOTE — [place] Notes (Signed)
[facility] [place]   CC: Follow up Abdominal Aortic Aneurysm  History of Present Illness  [name] [name] is a 84 y.o. (1931-10-25) female who is known to Dr. Donnetta Hutching from prior evaluation of lower extremity vascular pathology in 2010. At that time he recommended observation only and she is done quite well since that time.  She does complain of right leg pain and spinal stenosis. Had a CT of her lumbar lumbar spine in March 2017. This showed a 4.0 cm infrarenal abdominal aortic aneurysm with a slight increase in size since her prior CT in 2013. The patient had no prior knowledge of this. No family history of aneurysm. She does have significant cardiac disease but no history of peripheral vascular occlusive disease.  She denies abdominal pain denies any new back pain. She states she has bursitis in her right hip with bothers her when she lays on it.    Dr. Donnetta Hutching last evaluated pt on 01-18-16. At that time he indicated that she has minimal risk of rupture at the current size of her AAA; he did explain long-term follow-up with the natural history of slow progressive growth. Dr. Donnetta Hutching expects that at her advanced age that it will be unlikely that she would require repair; he recommended that we see her again in one year with ultrasound to rule out expansion. Dr. Donnetta Hutching did discuss the symptoms of leaking aneurysm she knows to seek emergency medical care should this occur.  Pt states she was told that she needs a right knee replacement, but surgeon is reluctant due to her being on coumadin, her cardiac history, and her advanced age.  Her walking is limited by this, uses a cane.  She denies any current chest pain or any worse dyspnea than usual.   The patient does not seem to have claudication in legs with walking. The patient denies any history of stroke or TIA symptoms.  She is taking coumadin since her aortic and mitral valve replacement. She also takes a  statin.  Diabetic: No, states she was diabetic until about 2014, is diet controlled now Tobacco use: former smoker, quit about 1988, smoked about 40 years    Past Medical History:  Diagnosis Date  . Adenomatous polyp of colon 12/16/2002   Dr. [name]/[facility]  . Allergy   . Cataract   . CHB (complete heart block) ([place]) 10/07/2014      . CHF (congestive heart failure) ([place])    a.  EF previously reduced to 25-30% b. EF improved to 60-65% by echo in 10/2017.   Marland Kitchen Chronic kidney disease, stage I 2006  . Constipation       . COPD (chronic obstructive pulmonary disease) ([place])       . DJD (degenerative joint disease) of lumbar spine   . DM (diabetes mellitus) ([place]) 2006  . Esophageal reflux   . Glaucoma   . Gout       . H/O heart valve replacement with mechanical valve    a. s/p mechanical AVR in 2001 and mechanical MVR in 2004.  Marland Kitchen Hyperlipemia   . IDA (iron deficiency anemia)    Parenteral iron/[name]  . Insomnia       . Non-ischemic cardiomyopathy ([place])    a. s/p MDT CRTD  . Obesity, unspecified   . Other and unspecified hyperlipidemia   . Oxygen deficiency 2014   nocturnal;  . Spondylolisthesis   . Spondylosis   . Thyroid cancer ([place])  remote thyroidectomy, no recurrence, pt denies in 2016 thyroid cancer  . Unspecified hypothyroidism       . 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 Vertigo        Past Surgical History:  Procedure Laterality Date  . AORTIC AND MITRAL VALVE REPLACEMENT  2001  . BI-VENTRICULAR IMPLANTABLE CARDIOVERTER DEFIBRILLATOR UPGRADE N/A 01/18/2015   Procedure: BI-VENTRICULAR IMPLANTABLE CARDIOVERTER DEFIBRILLATOR UPGRADE;  Surgeon: Evans Lance, MD;  Location: [facility] CATH LAB;  Service: Cardiovascular;  Laterality: N/A;  . CARDIAC CATHETERIZATION    . CARDIAC VALVE REPLACEMENT    . CARDIOVERSION N/A 11/13/2016   Procedure: CARDIOVERSION;  Surgeon: Sanda Klein, MD;  Location: [facility]  ENDOSCOPY;  Service: Cardiovascular;  Laterality: N/A;  . COLONOSCOPY  06/12/2007   Rourk- Normal rectum/Normal colon  . COLONOSCOPY  12/16/02   small hemorrhoids  . COLONOSCOPY  06/20/2012   Procedure: COLONOSCOPY;  Surgeon: Daneil Dolin, MD;  Location: [facility] ENDO SUITE;  Service: Endoscopy;  Laterality: N/A;  10:45  . defibrillator implanted 2006    . DOPPLER ECHOCARDIOGRAPHY  2012  . DOPPLER ECHOCARDIOGRAPHY  05,06,07,08,09,2011  . ESOPHAGOGASTRODUODENOSCOPY  06/12/2007   Rourk- normal esophagus, small hiatal hernia, otherwise normal stomach, D1, D2  . EYE SURGERY Right 2005  . EYE SURGERY Left 2006  . ICD LEAD REMOVAL Left 02/08/2015   Procedure: ICD LEAD REMOVAL;  Surgeon: Evans Lance, MD;  Location: [facility] OR;  Service: Cardiovascular;  Laterality: Left;  "Will plan extraction and insertion of a BiV PM"  **Dr. Roxan Hockey backing up case**  . IMPLANTABLE CARDIOVERTER DEFIBRILLATOR (ICD) GENERATOR CHANGE Left 02/08/2015   Procedure: ICD GENERATOR CHANGE;  Surgeon: Evans Lance, MD;  Location: [place];  Service: Cardiovascular;  Laterality: Left;  . INSERT / REPLACE / REMOVE PACEMAKER    . PACEMAKER INSERTION  May 2016  . pacemaker placed  2004  . right breast cyst removed     benign   . right cataract removed     2005  . TEE WITHOUT CARDIOVERSION N/A 11/13/2016   Procedure: TRANSESOPHAGEAL ECHOCARDIOGRAM (TEE);  Surgeon: Sanda Klein, MD;  Location: [facility] ENDOSCOPY;  Service: Cardiovascular;  Laterality: N/A;  . THYROIDECTOMY     Social History Social History   Socioeconomic History  . Marital status: Widowed    Spouse name: Not on file  . Number of children: 0  . Years of education: Not on file  . Highest education level: Not on file  Occupational History  . Occupation: retired    Fish farm manager: RETIRED  Social Needs  . Financial resource strain: Not on file  . Food insecurity:    Worry: Not on file    Inability: Not on file  . Transportation needs:    Medical: Not on file     Non-medical: Not on file  Tobacco Use  . Smoking status: Former Smoker    Packs/day: 0.75    Years: 40.00    Pack years: 30.00    Types: Cigarettes    Last attempt to quit: 03/08/1987    Years since quitting: 30.9  . Smokeless tobacco: Never Used  Substance and Sexual Activity  . Alcohol use: No    Alcohol/week: 0.0 oz  . Drug use: No  . Sexual activity: Not Currently  Lifestyle  . Physical activity:    Days per week: Not on file    Minutes per session: Not on file  . Stress: Not on file  Relationships  . Social connections:  Talks on phone: Not on file    Gets together: Not on file    Attends religious service: Not on file    Active member of club or organization: Not on file    Attends meetings of clubs or organizations: Not on file    Relationship status: Not on file  . Intimate partner violence:    Fear of current or ex partner: Not on file    Emotionally abused: Not on file    Physically abused: Not on file    Forced sexual activity: Not on file  Other Topics Concern  . Not on file  Social History Narrative   From [place] (2004)   Family History Family History  Problem Relation Age of Onset  . Pancreatic cancer Mother   . Heart disease Father   . Heart disease Sister   . Heart attack Sister   . Heart disease Brother   . Diabetes Brother   . Heart disease Brother   . Diabetes Brother   . Hypertension Brother   . Lung cancer Brother     Current Outpatient Medications on File Prior to Visit  Medication Sig Dispense Refill  . acetaminophen (TYLENOL) 500 MG tablet Take 500 mg by mouth every 6 (six) hours as needed (pain).    Marland Kitchen albuterol (PROVENTIL) (2.5 MG/3ML) 0.083% nebulizer solution INHALE 1 VIAL VIA NEBULIZER THREE TIMES DAILY. (Patient taking differently: INHALE 1 VIAL VIA NEBULIZER THREE TIMES DAILY AS NEEDED) 90 vial 5  . albuterol (VENTOLIN HFA) 108 (90 Base) MCG/ACT inhaler Inhale 2 puffs into the lungs every 6 (six) hours as needed for  wheezing or shortness of breath. 3 Inhaler 3  . allopurinol (ZYLOPRIM) 100 MG tablet Take 1 tablet (100 mg total) by mouth daily. 90 tablet 1  . azelastine (ASTELIN) 0.1 % nasal spray Place 2 sprays into both nostrils daily as needed for allergies. Use in each nostril as directed    . BREO ELLIPTA 100-25 MCG/INH AEPB Inhale 1 puff into the lungs daily.   1  . calcium carbonate (OS-CAL) 1250 (500 Ca) MG chewable tablet Chew 1 tablet by mouth daily.    . cholecalciferol (VITAMIN D) 1000 UNITS tablet Take 1,000 Units by mouth daily.    Marland Kitchen COUMADIN 5 MG tablet Take 1 tablet Tuesday, Thursday, and Saturday and  Take 2 tablets all other days. 180 tablet 3  . Darbepoetin Alfa (ARANESP, ALBUMIN FREE, IJ) EVERY THURSDAY    . digoxin (LANOXIN) 0.125 MG tablet Take 1 tablet (0.125 mg total) by mouth every other day. 45 tablet 3  . docusate sodium (COLACE) 100 MG capsule Take 300 mg by mouth at bedtime.    . fluticasone (FLONASE) 50 MCG/ACT nasal spray Place 2 sprays into both nostrils daily. Pt needs now 48 g 1  . folic acid (FOLVITE) 1 MG tablet TAKE 1 TABLET BY MOUTH ONCE DAILY 100 tablet 0  . furosemide (LASIX) 40 MG tablet Take 1 tablet by mouth  daily 90 tablet 1  . gabapentin (NEURONTIN) 300 MG capsule Take 1 capsule (300 mg total) by mouth at bedtime. 90 capsule 1  . guaiFENesin (MUCINEX) 600 MG 12 hr tablet Take 1 tablet (600 mg total) by mouth 2 (two) times daily. 60 tablet 2  . hydrALAZINE (APRESOLINE) 25 MG tablet Take 1 tablet (25 mg total) by mouth 3 (three) times daily. 270 tablet 3  . HYDROcodone-acetaminophen (NORCO) 7.5-325 MG tablet Take 1 tablet by mouth every 6 (six) hours as needed for moderate  pain (Must last 30 days.). 70 tablet 0  . isosorbide mononitrate (IMDUR) 60 MG 24 hr tablet Take 1 tablet (60 mg total) by mouth daily. 90 tablet 1  . meclizine (ANTIVERT) 25 MG tablet Take 1 tablet (25 mg total) by mouth 3 (three) times daily as needed for dizziness. 30 tablet 0  . metolazone  (ZAROXOLYN) 2.5 MG tablet TAKE 1 TABLET ONCE A WEEK 12 tablet 2  . metoprolol succinate (TOPROL XL) 100 MG 24 hr tablet Take 1 tablet (100 mg total) by mouth daily. Take with or immediately following a meal. 30 tablet 5  . Multiple Vitamin (MULTIVITAMIN) LIQD Take 5 mLs by mouth daily.    . ondansetron (ZOFRAN) 4 MG tablet Take 1 tablet (4 mg total) by mouth daily as needed for nausea or vomiting. 90 tablet 1  . Polyethyl [name] (SYSTANE OP) Place 1 drop into both eyes daily as needed (dry eyes).    . potassium chloride SA (KLOR-CON M20) 20 MEQ tablet Take 1 tablet by mouth  daily 90 tablet 1  . pravastatin (PRAVACHOL) 40 MG tablet Take 1 tablet (40 mg total) by mouth every evening. 90 tablet 3  . SYNTHROID 75 MCG tablet Take 1 tablet by mouth  daily 90 tablet 1  . UNABLE TO FIND Shower chair x 1  Dx high fall risk 1 each 0   Current Facility-Administered Medications on File Prior to Visit  Medication Dose Route Frequency Provider Last Rate Last Dose  . epoetin alfa (EPOGEN,PROCRIT) injection 40,000 Units  40,000 Units Subcutaneous Once Farrel Gobble, MD       Allergies  Allergen Reactions  . Penicillins Other (See Comments)    Bruise Has patient had a PCN reaction causing immediate rash, facial/tongue/throat swelling, SOB or lightheadedness with hypotension: no Has patient had a PCN reaction causing severe rash involving mucus membranes or skin necrosis: no Has patient had a PCN reaction that required hospitalization: no Has patient had a PCN reaction occurring within the last 10 years: no If all of the above answers are "NO", then may proceed with Cephalosporin use.   . Aspirin Other (See Comments)    On coumadin  . Fentanyl Dermatitis    Rash with patch   . Niacin Itching    ROS: See HPI for pertinent positives and negatives.  Physical Examination  Vitals:   02/26/18 0953 02/26/18 0958  BP: 138/64 (!) 144/77  Pulse: (!) 58   Resp: 18   Temp:  98.2 F  (36.8 C)  TempSrc:  Oral  SpO2: 96%   Weight: 207 lb (93.9 kg)   Height: 5\' 7"  (1.702 m)    Body mass index is 32.42 kg/m.    General: A&O x 3, WD, obese female in NAD. She ambulates with a cane. HEENT: PERRLA, no gross abnormalities  Pulmonary: Sym exp, respirations are non labored, good air movt, CTAB, no rales, rhonchi, or wheezing. Cardiac: RRR, Nl S1, S2, no detected murmur, prosthetic valve click heard. Pacemaker palpated left upper chest.    Carotid Bruits Right Left   Negative Negative    Abdominal aortic pulse is not palpable Radial pulses are 2+ palpable bilaterally                          VASCULAR EXAM:  LE Pulses Right Left       POPLITEAL  not palpable   not palpable       POSTERIOR TIBIAL  not palpable   not+ palpable        DORSALIS PEDIS      ANTERIOR TIBIAL 2+ palpable  2+ palpable     Gastrointestinal: soft, NTND, -G/R, - HSM, - masses palpated, - CVAT B. Musculoskeletal: M/S 5/5 in upper extremities, 4/5 in lower extremities, Extremities without ischemic changes. Neurologic: CN 2-12 intact, Pain and light touch intact in extremities are intact, Motor exam as listed above. Skin: No rashes, no ulcers, no cellulitis.   Psychiatric: Normal thought content, mood appropriate to clinical situation.    DATA  AAA Duplex :  Previous size: 4.08 cm (Date: 02-20-17), bilateral common iliac arteries were not visualized. Technically difficult exam due to patient body habitus and overlying bowel gas.  Current size:  4.15 cm (Date: 02-26-18); Right CIA: not visualized; Left CIA: not visualized  Limited exam due to overlying bowel gas and obesity.    CTA chest on 11-16-16 to evaluate dyspnea, CHF: Adequate contrast opacification of the thoracic aorta with no evidence of dissection, aneurysm, or stenosis. There is  dilated ascending aorta up to 4.2 cm diameter. Classic 3 vessel Brachiocephalic arch anatomy without proximal stenosis   Medical Decision Making  The patient is a 84 y.o. female who presents with asymptomatic AAA with no significant increase in size, 4.15 cm today, 4.08 cm on 02-20-17; both exams based on very limited visualization.   I discussed with Dr. Donnetta Hutching the 4.2 cm ascending aorta dilation found on CTA chest in February 2018. He advised that pt follow up with her PCP re following this.   Her serum creatinine on 12-11-17 was 1.89 (review of records)     Based on this patient's exam and diagnostic studies, the patient will follow up in 9 months  with the following studies: AAA duplex.  Consideration for repair of AAA would be made when the size is 5.0 cm, growth > 1 cm/yr, and symptomatic status.        The patient was given information about AAA including signs, symptoms, treatment, and how to minimize the risk of enlargement and rupture of aneurysms.    I emphasized the importance of maximal medical management including strict control of blood pressure, blood glucose, and lipid levels, antiplatelet agents, obtaining regular exercise, and continued  cessation of smoking.   The patient was advised to call 911 should the patient experience sudden onset abdominal or back pain.   Thank you for allowing [place] to participate in this patient's care.  Clemon Chambers, RN, MSN, FNP-C [facility] [place] Office: [phone]  Clinic Physician: Scot Dock on call  02/26/2018, 10:17 AM

## 2018-02-26 NOTE — Patient Instructions (Addendum)
Before your next abdominal ultrasound:  Take two Extra-Strength Gas-X capsules at bedtime the night before the test. Take another two Extra-Strength Gas-X capsules 3 hours before the test.  Avoid gas forming foods and beverages the day before the test.      Abdominal Aortic Aneurysm Blood pumps away from the heart through tubes (blood vessels) called arteries. Aneurysms are weak or damaged places in the wall of an artery. It bulges out like a balloon. An abdominal aortic aneurysm happens in the main artery of the body (aorta). It can burst or tear, causing bleeding inside the body. This is an emergency. It needs treatment right away. What are the causes? The exact cause is unknown. Things that could cause this problem include:  Fat and other substances building up in the lining of a tube.  Swelling of the walls of a blood vessel.  Certain tissue diseases.  Belly (abdominal) trauma.  An infection in the main artery of the body.  What increases the risk? There are things that make it more likely for you to have an aneurysm. These include:  Being over the age of 82 years old.  Having high blood pressure (hypertension).  Being a female.  Being white.  Being very overweight (obese).  Having a family history of aneurysm.  Using tobacco products.  What are the signs or symptoms? Symptoms depend on the size of the aneurysm and how fast it grows. There may not be symptoms. If symptoms occur, they can include:  Pain (belly, side, lower back, or groin).  Feeling full after eating a small amount of food.  Feeling sick to your stomach (nauseous), throwing up (vomiting), or both.  Feeling a lump in your belly that feels like it is beating (pulsating).  Feeling like you will pass out (faint).  How is this treated?  Medicine to control blood pressure and pain.  Imaging tests to see if the aneurysm gets bigger.  Surgery. How is this prevented? To lessen your chance of  getting this condition:  Stop smoking. Stop chewing tobacco.  Limit or avoid alcohol.  Keep your blood pressure, blood sugar, and cholesterol within normal limits.  Eat less salt.  Eat foods low in saturated fats and cholesterol. These are found in animal and whole dairy products.  Eat more fiber. Fiber is found in whole grains, vegetables, and fruits.  Keep a healthy weight.  Stay active and exercise often.  This information is not intended to replace advice given to you by your health care provider. Make sure you discuss any questions you have with your health care provider. Document Released: 01/06/2013 Document Revised: 02/17/2016 Document Reviewed: 10/11/2012 Elsevier Interactive Patient Education  2017 [employer]  

## 2018-02-27 DIAGNOSIS — M25561 Pain in right knee: Secondary | ICD-10-CM | POA: Diagnosis not present

## 2018-02-27 DIAGNOSIS — M1711 Unilateral primary osteoarthritis, right knee: Secondary | ICD-10-CM | POA: Diagnosis not present

## 2018-02-28 ENCOUNTER — Ambulatory Visit (INDEPENDENT_AMBULATORY_CARE_PROVIDER_SITE_OTHER): Payer: Medicare Other

## 2018-02-28 ENCOUNTER — Telehealth: Payer: Self-pay

## 2018-02-28 DIAGNOSIS — I5042 Chronic combined systolic (congestive) and diastolic (congestive) heart failure: Secondary | ICD-10-CM | POA: Diagnosis not present

## 2018-02-28 DIAGNOSIS — Z95810 Presence of automatic (implantable) cardiac defibrillator: Secondary | ICD-10-CM | POA: Diagnosis not present

## 2018-02-28 NOTE — Progress Notes (Signed)
EPIC Encounter for ICM Monitoring  Patient Name: [name] [name] is a 84 y.o. female Date: 02/28/2018 Primary Care Physican: Fayrene Helper, MD Primary Cardiologist:Croitoru Electrophysiologist:Taylor Dry Weight:Previous weight206lbs Bi-V Pacing:99.5%      Attempted call to patient and unable to reach.    Transmission reviewed.    Thoracic impedance abnormal suggesting fluid accumulation since 01/13/2018 and continues to trend just under baseline.  Prescribed dosage: Furosemide40 mg 1 tablet daily. Metolazaone 2.5 mg Take 1tablet once a week (Wednesday). Potassium 20 mEq take 1 tablet daily.  Labs: 12/11/2017 Creatinine1.89, BUN44, Potassium4.2, YBFXOV291, BTYO06-00 12/09/2017 Creatinine1.86, BUN51, Potassium4.1, KHTXHF414, ELTR32-02  11/17/2017 Creatinine2.36, BUN79, Potassium4.5, BXIDHW861, UOHF29-02  11/16/2017 Creatinine2.44, BUN68, Potassium4.1, XJDBZM080, EMVV61-22  11/15/2017 Creatinine2.34, BUN67, Potassium5.2, ESLPNP005, RTMY11-17  11/07/2017 Creatinine2.17, BUN55, Potassium4.7, BVAPOL410, VUDT14-38  11/02/2017 Creatinine1.74, BUN55, Potassium4.2, OILNZV728, ASUO15-61  11/01/2017 Creatinine1.93, BUN54, Potassium3.9, BPPHKF276, DYJW92-95  10/31/2017 Creatinine2.03, BUN48, Potassium4.5, FMBBUY370, DUKR83-81 A complete set of results can be found in Results Review.  Recommendations: NONE - Unable to reach.  Follow-up plan: ICM clinic phone appointment on 03/14/2018 to recheck fluid levels.   Office appointment with Dr Sallyanne Kuster on 04/01/2018.  Copy of ICM check sent to Dr. Lovena Le and Dr. Sallyanne Kuster.   3 month ICM trend: 02/28/2018    1 Year ICM trend:       [name] Billings, RN 02/28/2018 1:53 PM

## 2018-02-28 NOTE — Telephone Encounter (Signed)
Remote ICM transmission received.  Attempted call to patient and no answer or answering machine.  

## 2018-03-04 DIAGNOSIS — M1711 Unilateral primary osteoarthritis, right knee: Secondary | ICD-10-CM | POA: Diagnosis not present

## 2018-03-04 DIAGNOSIS — M25561 Pain in right knee: Secondary | ICD-10-CM | POA: Diagnosis not present

## 2018-03-06 DIAGNOSIS — M545 Low back pain: Secondary | ICD-10-CM | POA: Diagnosis not present

## 2018-03-07 ENCOUNTER — Telehealth: Payer: Self-pay | Admitting: Family Medicine

## 2018-03-07 NOTE — Telephone Encounter (Signed)
Patient went to urgent care yesterday for back pain, they recommended physical therapy. Patient is requesting an order for [facility] due to lack of transportation.

## 2018-03-08 ENCOUNTER — Other Ambulatory Visit: Payer: Self-pay

## 2018-03-08 DIAGNOSIS — I714 Abdominal aortic aneurysm, without rupture, unspecified: Secondary | ICD-10-CM

## 2018-03-08 NOTE — Telephone Encounter (Signed)
Ok twice weekly physical therap for 6 weeks at home , she is home bound

## 2018-03-08 NOTE — Telephone Encounter (Signed)
Done

## 2018-03-11 DIAGNOSIS — M1711 Unilateral primary osteoarthritis, right knee: Secondary | ICD-10-CM | POA: Diagnosis not present

## 2018-03-11 DIAGNOSIS — M25561 Pain in right knee: Secondary | ICD-10-CM | POA: Diagnosis not present

## 2018-03-12 ENCOUNTER — Telehealth: Payer: Self-pay | Admitting: Cardiovascular Disease

## 2018-03-12 ENCOUNTER — Telehealth: Payer: Self-pay | Admitting: Family Medicine

## 2018-03-12 NOTE — Telephone Encounter (Signed)
Dentist in [place] - Dr Tamela Oddi [phone] - Needs medical clearance to remove 2 wisdom teeth.

## 2018-03-12 NOTE — Telephone Encounter (Signed)
New message   1. What dental office are you calling from? [facility]  2. What is your office phone number?  [phone]  3. What is your fax number? [phone]  What type of procedure is the patient having performed? Extraction 17 and 31 What date is procedure scheduled or is the patient there now?  Not scheduled yet due to unclear on cardiac history 4. What is your question (ex. Antibiotics prior to procedure, holding medication-we need to know how long dentist wants pt to hold med)? Please advise

## 2018-03-13 MED ORDER — CLINDAMYCIN HCL 300 MG PO CAPS: ORAL_CAPSULE | ORAL | 3 refills | Status: DC

## 2018-03-13 NOTE — Telephone Encounter (Signed)
Spoke with requesting office and it is okay with them for pt to remain on coumadin while getting 2 teeth extracted. I have contacted pt and notified her that we have sent antibiotics to her pharmacy.

## 2018-03-13 NOTE — Telephone Encounter (Signed)
Will send to pharmacy for recommendations on anticoagulation and prophylactic antibiotics. She is s/p aortic and mitral valve replacement.

## 2018-03-13 NOTE — Telephone Encounter (Signed)
   Primary Cardiologist: Sanda Klein, MD  Chart reviewed as part of pre-operative protocol coverage. Patient was contacted 03/13/2018 in reference to pre-operative risk assessment for pending surgery as outlined below.  [name] [name] was last seen on 12/27/17 by [name] [name], [place].  Since that day, [name] [name] has done well with no angina or heart failure symptoms. She has a history of chronic combined systolic and diastolic CHF, paroxysmal atrial fibrillation, CHB (s/p Medtronic CRTD), mechanical mitral (2004) and aortic valve (2001) prosthesis, HTN, HLD, and Stage 3 CKD.   Therefore, based on ACC/AHA guidelines, the patient would be at acceptable risk for the planned procedure without further cardiovascular testing.   If the dentist is comfortable extracting  2 teeth while pt on coumadin she can continue the coumadin. If she stops her Coumadin, she would require a Lovenox bridge due to history of mechanical MVR and AVR as well as afib. Please let us know if this needs to be arranged.   She will also require prophylactic antibiotics due to valve history. Due to penicillin allergy, will send in rx for clindamycin 600mg  to take once 30 to 60 minutes prior to dental work. This has been sent to her [place] on file. The patient has been notified.           I will route this recommendation to the requesting party via Epic fax function and remove from pre-op pool.  Please call with questions.  Daune Perch, NP 03/13/2018, 3:55 PM

## 2018-03-13 NOTE — Telephone Encounter (Signed)
Would see if dentist is comfortable extracting 2 teeth if pt remains on Coumadin. If she stops her Coumadin, she would require a Lovenox bridge due to history of mechanical MVR and AVR as well as afib.  She will also require prophylactic antibiotics due to valve history. Due to penicillin allergy, will send in rx for clindamycin 600mg  to take once 30 to 60 minutes prior to dental work. Please inform pt this has been sent to her [place] on file.

## 2018-03-14 ENCOUNTER — Encounter (HOSPITAL_COMMUNITY): Payer: Self-pay

## 2018-03-14 ENCOUNTER — Ambulatory Visit (INDEPENDENT_AMBULATORY_CARE_PROVIDER_SITE_OTHER): Payer: Self-pay

## 2018-03-14 ENCOUNTER — Inpatient Hospital Stay (HOSPITAL_COMMUNITY): Payer: Medicare Other | Attending: Internal Medicine

## 2018-03-14 ENCOUNTER — Inpatient Hospital Stay (HOSPITAL_COMMUNITY): Payer: Medicare Other

## 2018-03-14 DIAGNOSIS — I110 Hypertensive heart disease with heart failure: Secondary | ICD-10-CM | POA: Diagnosis not present

## 2018-03-14 DIAGNOSIS — I129 Hypertensive chronic kidney disease with stage 1 through stage 4 chronic kidney disease, or unspecified chronic kidney disease: Secondary | ICD-10-CM | POA: Diagnosis not present

## 2018-03-14 DIAGNOSIS — Z7901 Long term (current) use of anticoagulants: Secondary | ICD-10-CM | POA: Diagnosis not present

## 2018-03-14 DIAGNOSIS — I4891 Unspecified atrial fibrillation: Secondary | ICD-10-CM | POA: Diagnosis not present

## 2018-03-14 DIAGNOSIS — M109 Gout, unspecified: Secondary | ICD-10-CM | POA: Diagnosis not present

## 2018-03-14 DIAGNOSIS — N184 Chronic kidney disease, stage 4 (severe): Secondary | ICD-10-CM | POA: Diagnosis not present

## 2018-03-14 DIAGNOSIS — I5042 Chronic combined systolic (congestive) and diastolic (congestive) heart failure: Secondary | ICD-10-CM

## 2018-03-14 DIAGNOSIS — M5136 Other intervertebral disc degeneration, lumbar region: Secondary | ICD-10-CM | POA: Diagnosis not present

## 2018-03-14 DIAGNOSIS — Z9981 Dependence on supplemental oxygen: Secondary | ICD-10-CM | POA: Diagnosis not present

## 2018-03-14 DIAGNOSIS — Z95810 Presence of automatic (implantable) cardiac defibrillator: Secondary | ICD-10-CM | POA: Diagnosis not present

## 2018-03-14 DIAGNOSIS — D649 Anemia, unspecified: Secondary | ICD-10-CM

## 2018-03-14 DIAGNOSIS — Z952 Presence of prosthetic heart valve: Secondary | ICD-10-CM | POA: Diagnosis not present

## 2018-03-14 DIAGNOSIS — I509 Heart failure, unspecified: Secondary | ICD-10-CM | POA: Diagnosis not present

## 2018-03-14 DIAGNOSIS — J449 Chronic obstructive pulmonary disease, unspecified: Secondary | ICD-10-CM | POA: Diagnosis not present

## 2018-03-14 LAB — CBC WITH DIFFERENTIAL/PLATELET
Basophils Absolute: 0 10*3/uL (ref 0.0–0.1)
Basophils Relative: 0 %
Eosinophils Absolute: 0 10*3/uL (ref 0.0–0.7)
Eosinophils Relative: 0 %
HCT: 30.2 % — ABNORMAL LOW (ref 36.0–46.0)
Hemoglobin: 9.5 g/dL — ABNORMAL LOW (ref 12.0–15.0)
Lymphocytes Relative: 16 %
Lymphs Abs: 1.1 10*3/uL (ref 0.7–4.0)
MCH: 29.4 pg (ref 26.0–34.0)
MCHC: 31.5 g/dL (ref 30.0–36.0)
MCV: 93.5 fL (ref 78.0–100.0)
Monocytes Absolute: 0.5 10*3/uL (ref 0.1–1.0)
Monocytes Relative: 8 %
Neutro Abs: 5.2 10*3/uL (ref 1.7–7.7)
Neutrophils Relative %: 76 %
Platelets: 176 10*3/uL (ref 150–400)
RBC: 3.23 MIL/uL — ABNORMAL LOW (ref 3.87–5.11)
RDW: 15.8 % — ABNORMAL HIGH (ref 11.5–15.5)
WBC: 6.8 10*3/uL (ref 4.0–10.5)

## 2018-03-14 MED ORDER — EPOETIN ALFA 40000 UNIT/ML IJ SOLN
40000.0000 [IU] | Freq: Once | INTRAMUSCULAR | Status: AC
  Administered 2018-03-14: 40000 [IU] via SUBCUTANEOUS
  Filled 2018-03-14: qty 1

## 2018-03-14 NOTE — Progress Notes (Signed)
EPIC Encounter for ICM Monitoring  Patient Name: [name] [name] is a 84 y.o. female Date: 03/14/2018 Primary Care Physican: Fayrene Helper, MD Primary Cardiologist:Croitoru Electrophysiologist:Taylor Dry Weight:205lbs Bi-V Pacing:99.4%      Heart Failure questions reviewed, pt asymptomatic.  Denies any fluid symptoms. Using continuous oxygen.   Thoracic impedance abnormal suggesting fluid accumulation since 01/13/2018 and continues to trend just under baseline.  Prescribed dosage: Furosemide40 mg 1 tablet daily. Metolazaone 2.5 mg Take 1tablet once a week (Wednesday). Potassium 20 mEq take 1 tablet daily.  Labs: 12/11/2017 Creatinine1.89, BUN44, Potassium4.2, AYGEFU072, TCCE83-37 12/09/2017 Creatinine1.86, BUN51, Potassium4.1, OUZHQU047, VVYX21-58  11/17/2017 Creatinine2.36, BUN79, Potassium4.5, NGBMBO485, TCNG39-43  11/16/2017 Creatinine2.44, BUN68, Potassium4.1, QWQVLD444, QFJU12-22  11/15/2017 Creatinine2.34, BUN67, Potassium5.2, IVHOYW314, CJAR01-10  11/07/2017 Creatinine2.17, BUN55, Potassium4.7, YPEJYL164, HDTP12-25  11/02/2017 Creatinine1.74, BUN55, Potassium4.2, YTMMIT947, XGXI71-29  11/01/2017 Creatinine1.93, BUN54, Potassium3.9, WTGRMB014, FPUL24-93  10/31/2017 Creatinine2.03, BUN48, Potassium4.5, SUNHRV444, PEAK35-07 A complete set of results can be found in Results Review.  Recommendations: No changes.    Encouraged to call for fluid symptoms.  Follow-up plan: ICM clinic phone appointment on 03/27/2018 to recheck fluid levels.  Office appointment scheduled 04/01/2018 with Dr. Sallyanne Kuster and 05/13/2018 with Dr Lovena Le.  Copy of ICM check sent to Dr. Sallyanne Kuster and Dr. Lovena Le.   3 month ICM trend: 03/14/2018    1 Year ICM trend:       [name], RN 03/14/2018 11:42 AM

## 2018-03-14 NOTE — Patient Instructions (Signed)
[facility] Discharge Instructions  Received Procrit injection today. Follow-up as scheduled. Call clinic for any questions or concerns   Thank you for choosing [facility] at [facility] to provide your oncology and hematology care.  To afford each patient quality time with our provider, please arrive at least 15 minutes before your scheduled appointment time.   If you have a lab appointment with the Cancer Center please come in thru the  Main Entrance and check in at the main information desk  You need to re-schedule your appointment should you arrive 10 or more minutes late.  We strive to give you quality time with our providers, and arriving late affects you and other patients whose appointments are after yours.  Also, if you no show three or more times for appointments you may be dismissed from the clinic at the providers discretion.     Again, thank you for choosing [facility].  Our hope is that these requests will decrease the amount of time that you wait before being seen by our physicians.       _____________________________________________________________  Should you have questions after your visit to [facility], please contact our office at [phone] between the hours of 8:30 a.m. and 4:30 p.m.  Voicemails left after 4:30 p.m. will not be returned until the following business day.  For prescription refill requests, have your pharmacy contact our office.       Resources For Cancer Patients and their Caregivers ? American Cancer Society: Can assist with transportation, wigs, general needs, runs Look Good Feel Better.        1-888-227-6333 ? Cancer Care: Provides financial assistance, online support groups, medication/co-pay assistance.  1-800-813-HOPE (4673) ? [facility] Assists [place] cancer patients and their families through emotional , educational and  financial support.  336-427-4357 ? [place] DSS Where to apply for food stamps, Medicaid and utility assistance. 336-342-1394 ? [facility]: Transportation to medical appointments. 336-347-2287 ? Social Security Administration: May apply for disability if have a Stage IV cancer. 336-342-7796 1-800-772-1213 ? [place] Aging, Disability and Transit Services: Assists with nutrition, care and transit needs. 336-349-2343  Cancer Center Support Programs:   > Cancer Support Group  2nd Tuesday of the month 1pm-2pm, Journey Room   > Creative Journey  3rd Tuesday of the month 1130am-1pm, Journey Room    

## 2018-03-14 NOTE — Telephone Encounter (Signed)
Faxed back recommendations from dr simpson

## 2018-03-14 NOTE — Progress Notes (Signed)
Thank you MCr 

## 2018-03-14 NOTE — Progress Notes (Signed)
[name] [name] tolerated Procrit injection well without complaints or incident. Hgb 9.5 today. VSS Pt discharged via wheelchair in satisfactory condition accompanied by family member

## 2018-03-18 DIAGNOSIS — M1711 Unilateral primary osteoarthritis, right knee: Secondary | ICD-10-CM | POA: Diagnosis not present

## 2018-03-18 DIAGNOSIS — M25561 Pain in right knee: Secondary | ICD-10-CM | POA: Diagnosis not present

## 2018-03-20 ENCOUNTER — Ambulatory Visit (INDEPENDENT_AMBULATORY_CARE_PROVIDER_SITE_OTHER): Payer: Medicare Other | Admitting: *Deleted

## 2018-03-20 DIAGNOSIS — N183 Chronic kidney disease, stage 3 (moderate): Secondary | ICD-10-CM | POA: Diagnosis not present

## 2018-03-20 DIAGNOSIS — I481 Persistent atrial fibrillation: Secondary | ICD-10-CM | POA: Diagnosis not present

## 2018-03-20 DIAGNOSIS — Z5181 Encounter for therapeutic drug level monitoring: Secondary | ICD-10-CM | POA: Diagnosis not present

## 2018-03-20 DIAGNOSIS — J449 Chronic obstructive pulmonary disease, unspecified: Secondary | ICD-10-CM | POA: Diagnosis not present

## 2018-03-20 DIAGNOSIS — I110 Hypertensive heart disease with heart failure: Secondary | ICD-10-CM | POA: Diagnosis not present

## 2018-03-20 DIAGNOSIS — Z952 Presence of prosthetic heart valve: Secondary | ICD-10-CM | POA: Diagnosis not present

## 2018-03-20 DIAGNOSIS — M109 Gout, unspecified: Secondary | ICD-10-CM | POA: Diagnosis not present

## 2018-03-20 DIAGNOSIS — M5136 Other intervertebral disc degeneration, lumbar region: Secondary | ICD-10-CM | POA: Diagnosis not present

## 2018-03-20 DIAGNOSIS — E559 Vitamin D deficiency, unspecified: Secondary | ICD-10-CM | POA: Diagnosis not present

## 2018-03-20 DIAGNOSIS — I4819 Other persistent atrial fibrillation: Secondary | ICD-10-CM

## 2018-03-20 DIAGNOSIS — I4891 Unspecified atrial fibrillation: Secondary | ICD-10-CM | POA: Diagnosis not present

## 2018-03-20 DIAGNOSIS — I10 Essential (primary) hypertension: Secondary | ICD-10-CM | POA: Diagnosis not present

## 2018-03-20 DIAGNOSIS — Z79899 Other long term (current) drug therapy: Secondary | ICD-10-CM | POA: Diagnosis not present

## 2018-03-20 DIAGNOSIS — D509 Iron deficiency anemia, unspecified: Secondary | ICD-10-CM | POA: Diagnosis not present

## 2018-03-20 DIAGNOSIS — I509 Heart failure, unspecified: Secondary | ICD-10-CM | POA: Diagnosis not present

## 2018-03-20 DIAGNOSIS — R809 Proteinuria, unspecified: Secondary | ICD-10-CM | POA: Diagnosis not present

## 2018-03-20 LAB — POCT INR: INR: 2.2 (ref 2.0–3.0)

## 2018-03-20 NOTE — Patient Instructions (Signed)
Continue coumadin 2 tablets daily except 1 tablet on Tuesdays, Thursdays and Saturdays.  Recheck in 6 weeks.

## 2018-03-22 ENCOUNTER — Telehealth: Payer: Self-pay | Admitting: Family Medicine

## 2018-03-22 DIAGNOSIS — I509 Heart failure, unspecified: Secondary | ICD-10-CM | POA: Diagnosis not present

## 2018-03-22 DIAGNOSIS — J449 Chronic obstructive pulmonary disease, unspecified: Secondary | ICD-10-CM | POA: Diagnosis not present

## 2018-03-22 DIAGNOSIS — I110 Hypertensive heart disease with heart failure: Secondary | ICD-10-CM | POA: Diagnosis not present

## 2018-03-22 DIAGNOSIS — M5136 Other intervertebral disc degeneration, lumbar region: Secondary | ICD-10-CM | POA: Diagnosis not present

## 2018-03-22 DIAGNOSIS — M109 Gout, unspecified: Secondary | ICD-10-CM | POA: Diagnosis not present

## 2018-03-22 DIAGNOSIS — I4891 Unspecified atrial fibrillation: Secondary | ICD-10-CM | POA: Diagnosis not present

## 2018-03-22 NOTE — Telephone Encounter (Signed)
Annette with [facility] called and said Ms Brilliant was wheezing.  I asked Dr [name] about this and she said she needs to go to the ED.  Anne Ng will call the patient.

## 2018-03-25 DIAGNOSIS — Z952 Presence of prosthetic heart valve: Secondary | ICD-10-CM | POA: Diagnosis not present

## 2018-03-25 DIAGNOSIS — Z95810 Presence of automatic (implantable) cardiac defibrillator: Secondary | ICD-10-CM | POA: Diagnosis not present

## 2018-03-25 DIAGNOSIS — M5136 Other intervertebral disc degeneration, lumbar region: Secondary | ICD-10-CM | POA: Diagnosis not present

## 2018-03-25 DIAGNOSIS — Z7901 Long term (current) use of anticoagulants: Secondary | ICD-10-CM | POA: Diagnosis not present

## 2018-03-25 DIAGNOSIS — J449 Chronic obstructive pulmonary disease, unspecified: Secondary | ICD-10-CM | POA: Diagnosis not present

## 2018-03-25 DIAGNOSIS — I4891 Unspecified atrial fibrillation: Secondary | ICD-10-CM | POA: Diagnosis not present

## 2018-03-25 DIAGNOSIS — I110 Hypertensive heart disease with heart failure: Secondary | ICD-10-CM | POA: Diagnosis not present

## 2018-03-25 DIAGNOSIS — M109 Gout, unspecified: Secondary | ICD-10-CM | POA: Diagnosis not present

## 2018-03-25 DIAGNOSIS — I509 Heart failure, unspecified: Secondary | ICD-10-CM | POA: Diagnosis not present

## 2018-03-25 DIAGNOSIS — Z9981 Dependence on supplemental oxygen: Secondary | ICD-10-CM | POA: Diagnosis not present

## 2018-03-26 DIAGNOSIS — M5136 Other intervertebral disc degeneration, lumbar region: Secondary | ICD-10-CM | POA: Diagnosis not present

## 2018-03-26 DIAGNOSIS — I110 Hypertensive heart disease with heart failure: Secondary | ICD-10-CM | POA: Diagnosis not present

## 2018-03-26 DIAGNOSIS — M109 Gout, unspecified: Secondary | ICD-10-CM | POA: Diagnosis not present

## 2018-03-26 DIAGNOSIS — I509 Heart failure, unspecified: Secondary | ICD-10-CM | POA: Diagnosis not present

## 2018-03-26 DIAGNOSIS — J449 Chronic obstructive pulmonary disease, unspecified: Secondary | ICD-10-CM | POA: Diagnosis not present

## 2018-03-26 DIAGNOSIS — I4891 Unspecified atrial fibrillation: Secondary | ICD-10-CM | POA: Diagnosis not present

## 2018-03-27 ENCOUNTER — Ambulatory Visit (INDEPENDENT_AMBULATORY_CARE_PROVIDER_SITE_OTHER): Payer: Medicare Other

## 2018-03-27 ENCOUNTER — Ambulatory Visit (INDEPENDENT_AMBULATORY_CARE_PROVIDER_SITE_OTHER): Payer: Medicare Other | Admitting: *Deleted

## 2018-03-27 DIAGNOSIS — I10 Essential (primary) hypertension: Secondary | ICD-10-CM | POA: Diagnosis not present

## 2018-03-27 DIAGNOSIS — N183 Chronic kidney disease, stage 3 (moderate): Secondary | ICD-10-CM | POA: Diagnosis not present

## 2018-03-27 DIAGNOSIS — R809 Proteinuria, unspecified: Secondary | ICD-10-CM | POA: Diagnosis not present

## 2018-03-27 DIAGNOSIS — I255 Ischemic cardiomyopathy: Secondary | ICD-10-CM | POA: Diagnosis not present

## 2018-03-27 DIAGNOSIS — Z95810 Presence of automatic (implantable) cardiac defibrillator: Secondary | ICD-10-CM

## 2018-03-27 DIAGNOSIS — I442 Atrioventricular block, complete: Secondary | ICD-10-CM

## 2018-03-27 DIAGNOSIS — E1129 Type 2 diabetes mellitus with other diabetic kidney complication: Secondary | ICD-10-CM | POA: Diagnosis not present

## 2018-03-27 DIAGNOSIS — I5042 Chronic combined systolic (congestive) and diastolic (congestive) heart failure: Secondary | ICD-10-CM

## 2018-03-27 NOTE — Progress Notes (Signed)
Thank you MCr 

## 2018-03-27 NOTE — Progress Notes (Signed)
EPIC Encounter for ICM Monitoring  Patient Name: [name] [name] is a 84 y.o. female Date: 03/27/2018 Primary Care Physican: Fayrene Helper, MD Primary Cardiologist:Croitoru Electrophysiologist:Taylor Dry Weight:205lbs Bi-V Pacing:99.7%       Heart Failure questions reviewed, pt asymptomatic.   Thoracic impedance returned to normal since last ICM remote transmission on 03/14/2018.   Prescribed dosage: Furosemide40 mg 1 tablet daily. Metolazaone 2.5 mg Take 1tablet once a week (Wednesday). Potassium 20 mEq take 1 tablet daily.  Labs: 12/11/2017 Creatinine1.89, BUN44, Potassium4.2, LJQGBE010, OFHQ19-75 12/09/2017 Creatinine1.86, BUN51, Potassium4.1, OITGPQ982, MEBR83-09  11/17/2017 Creatinine2.36, BUN79, Potassium4.5, MMHWKG881, JSRP59-45  11/16/2017 Creatinine2.44, BUN68, Potassium4.1, OPFYTW446, KMMN81-77  11/15/2017 Creatinine2.34, BUN67, Potassium5.2, NHAFBX038, BFXO32-91  11/07/2017 Creatinine2.17, BUN55, Potassium4.7, BTYOMA004, HTXH74-14  11/02/2017 Creatinine1.74, BUN55, Potassium4.2, ELTRVU023, XIDH68-61  11/01/2017 Creatinine1.93, BUN54, Potassium3.9, UOHFGB021, JDBZ20-80  10/31/2017 Creatinine2.03, BUN48, Potassium4.5, EMVVKP224, SLPN30-05 A complete set of results can be found in Results Review.  Recommendations: No changes.  Encouraged to call for fluid symptoms.  Follow-up plan: ICM clinic phone appointment on 05/02/2018.  Office appointment scheduled 04/01/2018 with Dr. Sallyanne Kuster and 05/13/2018 with Dr Lovena Le.  Copy of ICM check sent to Dr. Sallyanne Kuster.   3 month ICM trend: 03/27/2018    1 Year ICM trend:       [name], RN 03/27/2018 8:09 AM

## 2018-03-27 NOTE — Progress Notes (Signed)
Remote ICD transmission.   

## 2018-03-28 DIAGNOSIS — J449 Chronic obstructive pulmonary disease, unspecified: Secondary | ICD-10-CM | POA: Diagnosis not present

## 2018-03-28 DIAGNOSIS — M109 Gout, unspecified: Secondary | ICD-10-CM | POA: Diagnosis not present

## 2018-03-28 DIAGNOSIS — I509 Heart failure, unspecified: Secondary | ICD-10-CM | POA: Diagnosis not present

## 2018-03-28 DIAGNOSIS — I110 Hypertensive heart disease with heart failure: Secondary | ICD-10-CM | POA: Diagnosis not present

## 2018-03-28 DIAGNOSIS — I4891 Unspecified atrial fibrillation: Secondary | ICD-10-CM | POA: Diagnosis not present

## 2018-03-28 DIAGNOSIS — M5136 Other intervertebral disc degeneration, lumbar region: Secondary | ICD-10-CM | POA: Diagnosis not present

## 2018-03-30 ENCOUNTER — Emergency Department (HOSPITAL_COMMUNITY)
Admission: EM | Admit: 2018-03-30 | Discharge: 2018-03-30 | Disposition: A | Discharge status: Home / Self Care | Payer: Medicare Other | Attending: Emergency Medicine | Admitting: Emergency Medicine

## 2018-03-30 ENCOUNTER — Other Ambulatory Visit: Payer: Self-pay

## 2018-03-30 ENCOUNTER — Encounter (HOSPITAL_COMMUNITY): Payer: Self-pay | Admitting: Emergency Medicine

## 2018-03-30 DIAGNOSIS — Z79899 Other long term (current) drug therapy: Secondary | ICD-10-CM | POA: Insufficient documentation

## 2018-03-30 DIAGNOSIS — J449 Chronic obstructive pulmonary disease, unspecified: Secondary | ICD-10-CM | POA: Insufficient documentation

## 2018-03-30 DIAGNOSIS — Z950 Presence of cardiac pacemaker: Secondary | ICD-10-CM | POA: Insufficient documentation

## 2018-03-30 DIAGNOSIS — N184 Chronic kidney disease, stage 4 (severe): Secondary | ICD-10-CM | POA: Diagnosis not present

## 2018-03-30 DIAGNOSIS — R791 Abnormal coagulation profile: Secondary | ICD-10-CM | POA: Diagnosis not present

## 2018-03-30 DIAGNOSIS — K91840 Postprocedural hemorrhage and hematoma of a digestive system organ or structure following a digestive system procedure: Secondary | ICD-10-CM | POA: Diagnosis not present

## 2018-03-30 DIAGNOSIS — Z87891 Personal history of nicotine dependence: Secondary | ICD-10-CM | POA: Diagnosis not present

## 2018-03-30 DIAGNOSIS — Z7901 Long term (current) use of anticoagulants: Secondary | ICD-10-CM | POA: Insufficient documentation

## 2018-03-30 DIAGNOSIS — Z85850 Personal history of malignant neoplasm of thyroid: Secondary | ICD-10-CM | POA: Diagnosis not present

## 2018-03-30 DIAGNOSIS — I130 Hypertensive heart and chronic kidney disease with heart failure and stage 1 through stage 4 chronic kidney disease, or unspecified chronic kidney disease: Secondary | ICD-10-CM | POA: Diagnosis not present

## 2018-03-30 DIAGNOSIS — E039 Hypothyroidism, unspecified: Secondary | ICD-10-CM | POA: Diagnosis not present

## 2018-03-30 DIAGNOSIS — E1122 Type 2 diabetes mellitus with diabetic chronic kidney disease: Secondary | ICD-10-CM | POA: Diagnosis not present

## 2018-03-30 DIAGNOSIS — I5022 Chronic systolic (congestive) heart failure: Secondary | ICD-10-CM | POA: Insufficient documentation

## 2018-03-30 DIAGNOSIS — Z5181 Encounter for therapeutic drug level monitoring: Secondary | ICD-10-CM

## 2018-03-30 DIAGNOSIS — K1379 Other lesions of oral mucosa: Secondary | ICD-10-CM | POA: Diagnosis not present

## 2018-03-30 LAB — BASIC METABOLIC PANEL
Anion gap: 7 (ref 5–15)
BUN: 54 mg/dL — ABNORMAL HIGH (ref 8–23)
CO2: 29 mmol/L (ref 22–32)
Calcium: 9.2 mg/dL (ref 8.9–10.3)
Chloride: 97 mmol/L — ABNORMAL LOW (ref 98–111)
Creatinine, Ser: 1.78 mg/dL — ABNORMAL HIGH (ref 0.44–1.00)
GFR calc Af Amer: 29 mL/min — ABNORMAL LOW (ref >60)
GFR calc non Af Amer: 25 mL/min — ABNORMAL LOW (ref >60)
Glucose, Bld: 110 mg/dL — ABNORMAL HIGH (ref 70–99)
Potassium: 3.9 mmol/L (ref 3.5–5.1)
Sodium: 133 mmol/L — ABNORMAL LOW (ref 135–145)

## 2018-03-30 LAB — CBC WITH DIFFERENTIAL/PLATELET
Basophils Absolute: 0 10*3/uL (ref 0.0–0.1)
Basophils Relative: 0 %
Eosinophils Absolute: 0 10*3/uL (ref 0.0–0.7)
Eosinophils Relative: 0 %
HCT: 30.4 % — ABNORMAL LOW (ref 36.0–46.0)
Hemoglobin: 9.8 g/dL — ABNORMAL LOW (ref 12.0–15.0)
Lymphocytes Relative: 15 %
Lymphs Abs: 1 10*3/uL (ref 0.7–4.0)
MCH: 29.9 pg (ref 26.0–34.0)
MCHC: 32.2 g/dL (ref 30.0–36.0)
MCV: 92.7 fL (ref 78.0–100.0)
Monocytes Absolute: 0.6 10*3/uL (ref 0.1–1.0)
Monocytes Relative: 9 %
Neutro Abs: 5.2 10*3/uL (ref 1.7–7.7)
Neutrophils Relative %: 76 %
Platelets: 196 10*3/uL (ref 150–400)
RBC: 3.28 MIL/uL — ABNORMAL LOW (ref 3.87–5.11)
RDW: 16.1 % — ABNORMAL HIGH (ref 11.5–15.5)
WBC: 6.8 10*3/uL (ref 4.0–10.5)

## 2018-03-30 LAB — PROTIME-INR
INR: 1.48
Prothrombin Time: 17.8 seconds — ABNORMAL HIGH (ref 11.4–15.2)

## 2018-03-30 NOTE — ED Triage Notes (Signed)
Pt presents with bleeding from dental extraction yesterday, states it has not stopped bleeding since. Is on coumadin and did not hold any doses prior.

## 2018-03-30 NOTE — Discharge Instructions (Addendum)
Your evaluated in the emergency department for bleeding from your tooth extraction site.  This improved with some pressure held to the area with some clotting bandage.  Your Coumadin number is slightly low when you should take a double dose of your medication tonight.  If your mouth rebleeds please hold pressure to that area.  Return to the emergency department for any concerns.

## 2018-03-30 NOTE — ED Provider Notes (Signed)
[facility] EMERGENCY DEPARTMENT Provider Note   CSN: [id number] Arrival date & time: 03/30/18  1219     History   Chief Complaint Chief Complaint  Patient presents with  . Coagulation Disorder    HPI [name] [name] is a 84 y.o. female.  She is on Coumadin for mechanical valves.  She had a dental extraction yesterday with 2 teeth were removed.  She has had continued bleeding from her left lower molar.  She is tried a tea bag but continues to ooze.  She is not complaining of any chest pain or shortness of breath fevers cough.  She has chronic COPD and she is on home oxygen.  She tried to reach her dentist but they were closed for the day.  She denies any pain.  She denies bleeding from anywhere else.  The history is provided by the patient.  Dental Injury  This is a new problem. The current episode started yesterday. The problem has not changed since onset.Pertinent negatives include no chest pain, no abdominal pain, no headaches and no shortness of breath. Nothing aggravates the symptoms. Nothing relieves the symptoms. The treatment provided mild relief.    Past Medical History:  Diagnosis Date  . Adenomatous polyp of colon 12/16/2002   Dr. [name]/[facility]  . Allergy   . Cataract   . CHB (complete heart block) ([place]) 10/07/2014      . CHF (congestive heart failure) ([place])    a.  EF previously reduced to 25-30% b. EF improved to 60-65% by echo in 10/2017.   Marland Kitchen Chronic kidney disease, stage I 2006  . Constipation       . COPD (chronic obstructive pulmonary disease) ([place])       . DJD (degenerative joint disease) of lumbar spine   . DM (diabetes mellitus) ([place]) 2006  . Esophageal reflux   . Glaucoma   . Gout       . H/O heart valve replacement with mechanical valve    a. s/p mechanical AVR in 2001 and mechanical MVR in 2004.  Marland Kitchen Hyperlipemia   . IDA (iron deficiency anemia)    Parenteral iron/[name]  . Insomnia       . Non-ischemic  cardiomyopathy ([place])    a. s/p MDT CRTD  . Obesity, unspecified   . Other and unspecified hyperlipidemia   . Oxygen deficiency 2014   nocturnal;  . Spondylolisthesis   . Spondylosis   . Thyroid cancer ([place])    remote thyroidectomy, no recurrence, pt denies in 2016 thyroid cancer  . Unspecified hypothyroidism       . 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 Vertigo         Patient Active Problem List   Diagnosis Date Noted  . Volume overload 12/09/2017  . Acute on chronic systolic CHF (congestive heart failure) ([place]) 11/15/2017  . COPD with acute exacerbation ([place]) 11/15/2017  . Hospital discharge follow-up 11/07/2017  . Trochanteric bursitis, right hip 03/07/2017  . SOB (shortness of breath) 11/16/2016  . Persistent atrial fibrillation ([place])   . Nocturnal hypoxia 10/08/2016  . Dependence on nocturnal oxygen therapy 10/08/2016  . S/P ICD (internal cardiac defibrillator) procedure 02/08/2015  . CHB (complete heart block) ([place]) 10/07/2014  . Fatigue 07/20/2014  . Anemia 06/24/2014  . Osteopenia 02/10/2014  . Encounter for therapeutic drug monitoring 10/29/2013  . Spinal stenosis of lumbar region 02/19/2013  . OA (osteoarthritis) of knee 02/19/2013  . Chronic pain  of right knee 01/02/2013  . Hemolytic anemia ([place]) 09/24/2012  . H/O adenomatous polyp of colon 05/24/2012  . High risk medication use 05/24/2012  . COPD (chronic obstructive pulmonary disease) ([place]) 05/19/2012  . Chronic systolic heart failure ([place]) 05/19/2012  . Chronic anticoagulation, on coumadin for Mechanical AVR and MVR 04/01/2012  . Cardiorenal syndrome with renal failure 03/22/2012  . S/P AVR (aortic valve replacement) 03/22/2012  . S/P MVR (mitral valve replacement) 03/22/2012  . AICD (automatic cardioverter/defibrillator) present 03/22/2012  . CKD (chronic kidney disease), stage IV ([place]) 03/22/2012  . Acute respiratory failure with hypoxia ([place]) 03/21/2012  . Warfarin-induced  coagulopathy ([place]) 03/21/2012  . Papilloma of breast 03/06/2012  . Iron deficiency 10/04/2011  . Allergic rhinitis 02/14/2011  . Nausea without vomiting 06/07/2010  . HIP PAIN, RIGHT 04/23/2009  . VARICOSE VEINS LOWER EXTREMITIES W/OTH COMPS 02/09/2009  . Calculus of gallbladder with chronic cholecystitis without obstruction 02/09/2009  . Prediabetes 06/15/2008  . Hypothyroidism, postsurgical 02/24/2008  . Hyperlipidemia LDL goal <70 02/24/2008  . Mild obesity 02/24/2008  . Essential hypertension 02/24/2008  . Non-ischemic cardiomyopathy with ICD 02/24/2008  . GERD 02/24/2008  . DEGENERATIVE JOINT DISEASE, SPINE 02/24/2008    Past Surgical History:  Procedure Laterality Date  . AORTIC AND MITRAL VALVE REPLACEMENT  2001  . BI-VENTRICULAR IMPLANTABLE CARDIOVERTER DEFIBRILLATOR UPGRADE N/A 01/18/2015   Procedure: BI-VENTRICULAR IMPLANTABLE CARDIOVERTER DEFIBRILLATOR UPGRADE;  Surgeon: Evans Lance, MD;  Location: [facility] CATH LAB;  Service: Cardiovascular;  Laterality: N/A;  . CARDIAC CATHETERIZATION    . CARDIAC VALVE REPLACEMENT    . CARDIOVERSION N/A 11/13/2016   Procedure: CARDIOVERSION;  Surgeon: Sanda Klein, MD;  Location: [facility] ENDOSCOPY;  Service: Cardiovascular;  Laterality: N/A;  . COLONOSCOPY  06/12/2007   Rourk- Normal rectum/Normal colon  . COLONOSCOPY  12/16/02   small hemorrhoids  . COLONOSCOPY  06/20/2012   Procedure: COLONOSCOPY;  Surgeon: Daneil Dolin, MD;  Location: [facility] ENDO SUITE;  Service: Endoscopy;  Laterality: N/A;  10:45  . defibrillator implanted 2006    . DOPPLER ECHOCARDIOGRAPHY  2012  . DOPPLER ECHOCARDIOGRAPHY  05,06,07,08,09,2011  . ESOPHAGOGASTRODUODENOSCOPY  06/12/2007   Rourk- normal esophagus, small hiatal hernia, otherwise normal stomach, D1, D2  . EYE SURGERY Right 2005  . EYE SURGERY Left 2006  . ICD LEAD REMOVAL Left 02/08/2015   Procedure: ICD LEAD REMOVAL;  Surgeon: Evans Lance, MD;  Location: [facility] OR;  Service: Cardiovascular;  Laterality:  Left;  "Will plan extraction and insertion of a BiV PM"  **Dr. Roxan Hockey backing up case**  . IMPLANTABLE CARDIOVERTER DEFIBRILLATOR (ICD) GENERATOR CHANGE Left 02/08/2015   Procedure: ICD GENERATOR CHANGE;  Surgeon: Evans Lance, MD;  Location: [place];  Service: Cardiovascular;  Laterality: Left;  . INSERT / REPLACE / REMOVE PACEMAKER    . PACEMAKER INSERTION  May 2016  . pacemaker placed  2004  . right breast cyst removed     benign   . right cataract removed     2005  . TEE WITHOUT CARDIOVERSION N/A 11/13/2016   Procedure: TRANSESOPHAGEAL ECHOCARDIOGRAM (TEE);  Surgeon: Sanda Klein, MD;  Location: [facility] ENDOSCOPY;  Service: Cardiovascular;  Laterality: N/A;  . THYROIDECTOMY       OB History    Gravida      Para      Term      Preterm      AB      Living  0     SAB      TAB  Ectopic      Multiple      Live Births               Home Medications    Prior to Admission medications   Medication Sig Start Date End Date Taking? Authorizing Provider  acetaminophen (TYLENOL) 500 MG tablet Take 500 mg by mouth every 6 (six) hours as needed (pain).    [provider]  albuterol (PROVENTIL) (2.5 MG/3ML) 0.083% nebulizer solution INHALE 1 VIAL VIA NEBULIZER THREE TIMES DAILY. Patient taking differently: INHALE 1 VIAL VIA NEBULIZER THREE TIMES DAILY AS NEEDED 12/28/16   Fayrene Helper, MD  albuterol (VENTOLIN HFA) 108 (90 Base) MCG/ACT inhaler Inhale 2 puffs into the lungs every 6 (six) hours as needed for wheezing or shortness of breath. 10/19/16   Fayrene Helper, MD  allopurinol (ZYLOPRIM) 100 MG tablet Take 1 tablet (100 mg total) by mouth daily. 10/18/17   Fayrene Helper, MD  azelastine (ASTELIN) 0.1 % nasal spray Place 2 sprays into both nostrils daily as needed for allergies. Use in each nostril as directed 12/11/17   Johnson, Clanford L, MD  BREO ELLIPTA 100-25 MCG/INH AEPB Inhale 1 puff into the lungs daily.  10/04/17   [provider]  calcium carbonate (OS-CAL) 1250 (500 Ca) MG chewable tablet Chew 1 tablet by mouth daily. 07/15/10   [provider]  cholecalciferol (VITAMIN D) 1000 UNITS tablet Take 1,000 Units by mouth daily.    [provider]  clindamycin (CLEOCIN) 300 MG capsule Take 2 capsules 30 to 60 minutes prior to dental work. 03/13/18   Croitoru, Dani Gobble, MD  COUMADIN 5 MG tablet Take 1 tablet Tuesday, Thursday, and Saturday and  Take 2 tablets all other days. 01/16/18   Croitoru, Mihai, MD  Darbepoetin Alfa (ARANESP, ALBUMIN FREE, IJ) EVERY THURSDAY 07/15/10   [provider]  digoxin (LANOXIN) 0.125 MG tablet Take 1 tablet (0.125 mg total) by mouth every other day. 07/23/17   Lendon Colonel, NP  docusate sodium (COLACE) 100 MG capsule Take 300 mg by mouth at bedtime.    [provider]  fluticasone (FLONASE) 50 MCG/ACT nasal spray Place 2 sprays into both nostrils daily. Pt needs now 11/30/17   Fayrene Helper, MD  folic acid (FOLVITE) 1 MG tablet TAKE 1 TABLET BY MOUTH ONCE DAILY 01/22/18   Fayrene Helper, MD  furosemide (LASIX) 40 MG tablet Take 1 tablet by mouth  daily 07/23/17   Lendon Colonel, NP  gabapentin (NEURONTIN) 300 MG capsule Take 1 capsule (300 mg total) by mouth at bedtime. 11/19/17   Fayrene Helper, MD  guaiFENesin (MUCINEX) 600 MG 12 hr tablet Take 1 tablet (600 mg total) by mouth 2 (two) times daily. 11/17/17 11/17/18  Kathie Dike, MD  hydrALAZINE (APRESOLINE) 25 MG tablet Take 1 tablet (25 mg total) by mouth 3 (three) times daily. 07/23/17   Lendon Colonel, NP  HYDROcodone-acetaminophen (NORCO) 7.5-325 MG tablet Take 1 tablet by mouth every 6 (six) hours as needed for moderate pain (Must last 30 days.). 10/24/17   Sanjuana Kava, MD  isosorbide mononitrate (IMDUR) 60 MG 24 hr tablet Take 1 tablet (60 mg total) by mouth daily. 11/06/17   Croitoru, Mihai, MD  meclizine (ANTIVERT) 25 MG tablet Take 1 tablet (25 mg total) by mouth 3 (three)  times daily as needed for dizziness. 05/01/17   Fayrene Helper, MD  metolazone (ZAROXOLYN) 2.5 MG tablet TAKE 1 TABLET ONCE A WEEK  02/25/18   Fayrene Helper, MD  metoprolol succinate (TOPROL XL) 100 MG 24 hr tablet Take 1 tablet (100 mg total) by mouth daily. Take with or immediately following a meal. 01/09/18   Fayrene Helper, MD  Multiple Vitamin (MULTIVITAMIN) LIQD Take 5 mLs by mouth daily.    [provider]  ondansetron (ZOFRAN) 4 MG tablet Take 1 tablet (4 mg total) by mouth daily as needed for nausea or vomiting. 02/25/18   Fayrene Helper, MD  Polyethyl [name] (SYSTANE OP) Place 1 drop into both eyes daily as needed (dry eyes).    [provider]  potassium chloride SA (KLOR-CON M20) 20 MEQ tablet Take 1 tablet by mouth  daily 02/25/18   Fayrene Helper, MD  pravastatin (PRAVACHOL) 40 MG tablet Take 1 tablet (40 mg total) by mouth every evening. 07/23/17 11/01/18  Lendon Colonel, NP  SYNTHROID 75 MCG tablet Take 1 tablet by mouth  daily 10/18/17   Fayrene Helper, MD  UNABLE TO FIND Shower chair x 1  Dx high fall risk 02/01/18   Fayrene Helper, MD    Family History Family History  Problem Relation Age of Onset  . Pancreatic cancer Mother   . Heart disease Father   . Heart disease Sister   . Heart attack Sister   . Heart disease Brother   . Diabetes Brother   . Heart disease Brother   . Diabetes Brother   . Hypertension Brother   . Lung cancer Brother     Social History Social History   Tobacco Use  . Smoking status: Former Smoker    Packs/day: 0.75    Years: 40.00    Pack years: 30.00    Types: Cigarettes    Last attempt to quit: 03/08/1987    Years since quitting: 31.0  . Smokeless tobacco: Never Used  Substance Use Topics  . Alcohol use: No    Alcohol/week: 0.0 oz  . Drug use: No     Allergies   Penicillins; Aspirin; Fentanyl; and Niacin   Review of Systems Review of Systems  Constitutional:  Negative for fever.  HENT: Negative for nosebleeds and sore throat.   Eyes: Negative for visual disturbance.  Respiratory: Negative for shortness of breath.   Cardiovascular: Negative for chest pain.  Gastrointestinal: Negative for abdominal pain.  Genitourinary: Negative for dysuria.  Musculoskeletal: Negative for neck pain.  Skin: Negative for rash.  Neurological: Negative for headaches.     Physical Exam Updated Vital Signs BP 126/64   Pulse 74   Temp 98.2 F (36.8 C) (Oral)   Resp 18   SpO2 94%   Physical Exam  Constitutional: She appears well-developed and well-nourished.  HENT:  Head: Normocephalic and atraumatic.  Mouth/Throat: Mucous membranes are normal. Abnormal dentition. No uvula swelling.    Eyes: Conjunctivae are normal.  Neck: Neck supple.  Cardiovascular: Normal rate and regular rhythm.  No murmur heard. Mechanical click crisp  Pulmonary/Chest: Effort normal. No respiratory distress.  Abdominal: Soft.  Neurological: She is alert. GCS eye subscore is 4. GCS verbal subscore is 5. GCS motor subscore is 6.  Skin: Skin is warm and dry. Capillary refill takes less than 2 seconds.  Psychiatric: She has a normal mood and affect.  Nursing note and vitals reviewed.    ED Treatments / Results  Labs (all labs ordered are listed, but only abnormal results are displayed) Labs Reviewed  BASIC METABOLIC PANEL - Abnormal; Notable for the following  components:      Result Value   Sodium 133 (*)    Chloride 97 (*)    Glucose, Bld 110 (*)    BUN 54 (*)    Creatinine, Ser 1.78 (*)    GFR calc non Af Amer 25 (*)    GFR calc Af Amer 29 (*)    All other components within normal limits  PROTIME-INR - Abnormal; Notable for the following components:   Prothrombin Time 17.8 (*)    All other components within normal limits  CBC WITH DIFFERENTIAL/PLATELET - Abnormal; Notable for the following components:   RBC 3.28 (*)    Hemoglobin 9.8 (*)    HCT 30.4 (*)    RDW  16.1 (*)    All other components within normal limits    EKG None  Radiology No results found.  Procedures Procedures (including critical care time)  Medications Ordered in ED Medications - No data to display   Initial Impression / Assessment and Plan / ED Course  I have reviewed the triage vital signs and the nursing notes.  Pertinent labs & imaging results that were available during my care of the patient were reviewed by me and considered in my medical decision making (see chart for details).  Clinical Course as of Mar 30 1758  Sat Mar 30, 2018  1253 Try to remove as much of the clot overlying the extraction with a 4 x 4 and then we placed some quick clot bandage wadded up to hold over the extraction site and then she is biting down to keep it in place.  We will continue to observe.   [MB]  8309 Patient's hemoglobin is low at 9.8 but appears to be baseline for her.   [MB]  1401 Reevaluated patient remove the quick clot.  The extraction bed looks dry at this point.  I reviewed with the patient that her INR is low and she would need to take a double dose of her medication tonight.  She understands to reapply pressure if the tooth starts bleeding again.   [MB]    Clinical Course User Index [MB] Hayden Rasmussen, MD    Final Clinical Impressions(s) / ED Diagnoses   Final diagnoses:  Oral bleeding  Subtherapeutic anticoagulation    ED Discharge Orders    None       Hayden Rasmussen, MD 03/30/18 1800

## 2018-04-01 ENCOUNTER — Encounter: Payer: Self-pay | Admitting: Cardiovascular Disease

## 2018-04-01 ENCOUNTER — Ambulatory Visit (INDEPENDENT_AMBULATORY_CARE_PROVIDER_SITE_OTHER): Payer: Medicare Other | Admitting: Cardiovascular Disease

## 2018-04-01 VITALS — BP 99/52 | HR 73 | Ht 67.0 in | Wt 202.8 lb

## 2018-04-01 DIAGNOSIS — Z95810 Presence of automatic (implantable) cardiac defibrillator: Secondary | ICD-10-CM | POA: Diagnosis not present

## 2018-04-01 DIAGNOSIS — Z952 Presence of prosthetic heart valve: Secondary | ICD-10-CM

## 2018-04-01 DIAGNOSIS — D594 Other nonautoimmune hemolytic anemias: Secondary | ICD-10-CM | POA: Diagnosis not present

## 2018-04-01 DIAGNOSIS — I442 Atrioventricular block, complete: Secondary | ICD-10-CM

## 2018-04-01 DIAGNOSIS — I5032 Chronic diastolic (congestive) heart failure: Secondary | ICD-10-CM

## 2018-04-01 DIAGNOSIS — E669 Obesity, unspecified: Secondary | ICD-10-CM

## 2018-04-01 DIAGNOSIS — I481 Persistent atrial fibrillation: Secondary | ICD-10-CM

## 2018-04-01 DIAGNOSIS — I4819 Other persistent atrial fibrillation: Secondary | ICD-10-CM

## 2018-04-01 DIAGNOSIS — N184 Chronic kidney disease, stage 4 (severe): Secondary | ICD-10-CM

## 2018-04-01 DIAGNOSIS — I10 Essential (primary) hypertension: Secondary | ICD-10-CM

## 2018-04-01 DIAGNOSIS — I255 Ischemic cardiomyopathy: Secondary | ICD-10-CM | POA: Diagnosis not present

## 2018-04-01 NOTE — Patient Instructions (Signed)
Dr Sallyanne Kuster recommends that you schedule a follow-up appointment in 4 months.  If you need a refill on your cardiac medications before your next appointment, please call your pharmacy.

## 2018-04-02 DIAGNOSIS — I4891 Unspecified atrial fibrillation: Secondary | ICD-10-CM | POA: Diagnosis not present

## 2018-04-02 DIAGNOSIS — L851 Acquired keratosis [keratoderma] palmaris et plantaris: Secondary | ICD-10-CM | POA: Diagnosis not present

## 2018-04-02 DIAGNOSIS — L603 Nail dystrophy: Secondary | ICD-10-CM | POA: Diagnosis not present

## 2018-04-02 DIAGNOSIS — M5136 Other intervertebral disc degeneration, lumbar region: Secondary | ICD-10-CM | POA: Diagnosis not present

## 2018-04-02 DIAGNOSIS — J449 Chronic obstructive pulmonary disease, unspecified: Secondary | ICD-10-CM | POA: Diagnosis not present

## 2018-04-02 DIAGNOSIS — I110 Hypertensive heart disease with heart failure: Secondary | ICD-10-CM | POA: Diagnosis not present

## 2018-04-02 DIAGNOSIS — E1142 Type 2 diabetes mellitus with diabetic polyneuropathy: Secondary | ICD-10-CM | POA: Diagnosis not present

## 2018-04-02 DIAGNOSIS — I509 Heart failure, unspecified: Secondary | ICD-10-CM | POA: Diagnosis not present

## 2018-04-02 DIAGNOSIS — M109 Gout, unspecified: Secondary | ICD-10-CM | POA: Diagnosis not present

## 2018-04-03 ENCOUNTER — Encounter: Payer: Self-pay | Admitting: Cardiovascular Disease

## 2018-04-03 NOTE — Progress Notes (Signed)
Cardiology Office Note    Date:  04/03/2018   ID:  [name] [name], DOB April 23, 1932, MRN [id number]  PCP:  Fayrene Helper, MD  Cardiologist:  Cristopher Peru, M.D. (EP/ICD); Sanda Klein, MD   No chief complaint on file.   History of Present Illness:  [name] [name] is a 84 y.o. female returning in follow-up for valvular heart disease, persistent atrial fibrillation, complete heart block and chronic combined systolic and diastolic heart failure.  Since her March hospitalization for heart failure exacerbation she has not had any serious cardiovascular problems.  She denies problems with orthopnea, PND and her lower extremity edema is fairly well managed.  She denies angina and has not had syncope or palpitations.  When she was discharged from [facility] point hospital she weighed 213 pounds.  Her weight is now much lower than that.   Recently had bilateral wisdom tooth extraction.  She had to go to the ER for oral bleeding after that.  No other major medical problems recently.  Her weight has been fairly steady at around 204 pounds at her home scale.  Our office scale actually seems to be showing about 1 pound less.  Has been monitored remotely in the heart failure device clinic with fairly steady OptiVol levels.  She has not required metolazone recently.  Atrial fibrillation has not really been a problem this year, compared to last year.  The overall burden of atrial fibrillation has been much lower and he really seems to have little impact on her functional status.  She has virtually 100% biventricular pacing.  OptiVol was a little out of range a month ago but has returned to baseline..  Her device is functioning normally and there has ben no recent ventricular arrhythmia. 100% BiV paced. Estimated generator longevity 12 months.  Her estimated generator longevity is about 6 months.  She is pacemaker dependent due to complete heart block following aortic and mitral valve replacement with  mechanical prostheses. (AVR 2001, MVR 2004, both [employer] prostheses, surgeries performed in [place]). She has severe cardiomyopathy with an estimated left ventricular ejection fraction of 25%, unchanged since 2004. She did not have any coronary artery disease by angiography before her first operation. In May 2016 her initial pacemaker lead was extracted and she received a CRT-D Medtronic Viva XT device by Dr. Cristopher Peru.  She has had problems with multifactorial anemia, including a component of prosthesis related hemolytic anemia, baseline hemoglobin around 10.   Past Medical History:  Diagnosis Date  . Adenomatous polyp of colon 12/16/2002   Dr. [name]/[facility]  . Allergy   . Cataract   . CHB (complete heart block) ([place]) 10/07/2014      . CHF (congestive heart failure) ([place])    a.  EF previously reduced to 25-30% b. EF improved to 60-65% by echo in 10/2017.   Marland Kitchen Chronic kidney disease, stage I 2006  . Constipation       . COPD (chronic obstructive pulmonary disease) ([place])       . DJD (degenerative joint disease) of lumbar spine   . DM (diabetes mellitus) ([place]) 2006  . Esophageal reflux   . Glaucoma   . Gout       . H/O heart valve replacement with mechanical valve    a. s/p mechanical AVR in 2001 and mechanical MVR in 2004.  Marland Kitchen Hyperlipemia   . IDA (iron deficiency anemia)    Parenteral iron/[name]  . Insomnia       .  Non-ischemic cardiomyopathy ([place])    a. s/p MDT CRTD  . Obesity, unspecified   . Other and unspecified hyperlipidemia   . Oxygen deficiency 2014   nocturnal;  . Spondylolisthesis   . Spondylosis   . Thyroid cancer ([place])    remote thyroidectomy, no recurrence, pt denies in 2016 thyroid cancer  . Unspecified hypothyroidism       . 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 Vertigo         Past Surgical History:  Procedure Laterality Date  . AORTIC AND MITRAL VALVE REPLACEMENT  2001    . BI-VENTRICULAR IMPLANTABLE CARDIOVERTER DEFIBRILLATOR UPGRADE N/A 01/18/2015   Procedure: BI-VENTRICULAR IMPLANTABLE CARDIOVERTER DEFIBRILLATOR UPGRADE;  Surgeon: Evans Lance, MD;  Location: [facility] CATH LAB;  Service: Cardiovascular;  Laterality: N/A;  . CARDIAC CATHETERIZATION    . CARDIAC VALVE REPLACEMENT    . CARDIOVERSION N/A 11/13/2016   Procedure: CARDIOVERSION;  Surgeon: Sanda Klein, MD;  Location: [facility] ENDOSCOPY;  Service: Cardiovascular;  Laterality: N/A;  . COLONOSCOPY  06/12/2007   Rourk- Normal rectum/Normal colon  . COLONOSCOPY  12/16/02   small hemorrhoids  . COLONOSCOPY  06/20/2012   Procedure: COLONOSCOPY;  Surgeon: Daneil Dolin, MD;  Location: [facility] ENDO SUITE;  Service: Endoscopy;  Laterality: N/A;  10:45  . defibrillator implanted 2006    . DOPPLER ECHOCARDIOGRAPHY  2012  . DOPPLER ECHOCARDIOGRAPHY  05,06,07,08,09,2011  . ESOPHAGOGASTRODUODENOSCOPY  06/12/2007   Rourk- normal esophagus, small hiatal hernia, otherwise normal stomach, D1, D2  . EYE SURGERY Right 2005  . EYE SURGERY Left 2006  . ICD LEAD REMOVAL Left 02/08/2015   Procedure: ICD LEAD REMOVAL;  Surgeon: Evans Lance, MD;  Location: [facility] OR;  Service: Cardiovascular;  Laterality: Left;  "Will plan extraction and insertion of a BiV PM"  **Dr. Roxan Hockey backing up case**  . IMPLANTABLE CARDIOVERTER DEFIBRILLATOR (ICD) GENERATOR CHANGE Left 02/08/2015   Procedure: ICD GENERATOR CHANGE;  Surgeon: Evans Lance, MD;  Location: [place];  Service: Cardiovascular;  Laterality: Left;  . INSERT / REPLACE / REMOVE PACEMAKER    . PACEMAKER INSERTION  May 2016  . pacemaker placed  2004  . right breast cyst removed     benign   . right cataract removed     2005  . TEE WITHOUT CARDIOVERSION N/A 11/13/2016   Procedure: TRANSESOPHAGEAL ECHOCARDIOGRAM (TEE);  Surgeon: Sanda Klein, MD;  Location: [facility] ENDOSCOPY;  Service: Cardiovascular;  Laterality: N/A;  . THYROIDECTOMY      Current Medications: Outpatient  Medications Prior to Visit  Medication Sig Dispense Refill  . acetaminophen (TYLENOL) 500 MG tablet Take 500 mg by mouth every 6 (six) hours as needed (pain).    Marland Kitchen albuterol (PROVENTIL) (2.5 MG/3ML) 0.083% nebulizer solution INHALE 1 VIAL VIA NEBULIZER THREE TIMES DAILY. (Patient taking differently: INHALE 1 VIAL VIA NEBULIZER THREE TIMES DAILY AS NEEDED) 90 vial 5  . albuterol (VENTOLIN HFA) 108 (90 Base) MCG/ACT inhaler Inhale 2 puffs into the lungs every 6 (six) hours as needed for wheezing or shortness of breath. 3 Inhaler 3  . allopurinol (ZYLOPRIM) 100 MG tablet Take 1 tablet (100 mg total) by mouth daily. 90 tablet 1  . azelastine (ASTELIN) 0.1 % nasal spray Place 2 sprays into both nostrils daily as needed for allergies. Use in each nostril as directed    . BREO ELLIPTA 100-25 MCG/INH AEPB Inhale 1 puff into the lungs daily.   1  . calcium carbonate (OS-CAL) 1250 (500 Ca)  MG chewable tablet Chew 1 tablet by mouth daily.    . cholecalciferol (VITAMIN D) 1000 UNITS tablet Take 1,000 Units by mouth daily.    . clindamycin (CLEOCIN) 300 MG capsule Take 2 capsules 30 to 60 minutes prior to dental work. 2 capsule 3  . COUMADIN 5 MG tablet Take 1 tablet Tuesday, Thursday, and Saturday and  Take 2 tablets all other days. 180 tablet 3  . Darbepoetin Alfa (ARANESP, ALBUMIN FREE, IJ) EVERY THURSDAY    . digoxin (LANOXIN) 0.125 MG tablet Take 1 tablet (0.125 mg total) by mouth every other day. 45 tablet 3  . docusate sodium (COLACE) 100 MG capsule Take 300 mg by mouth at bedtime.    . fluticasone (FLONASE) 50 MCG/ACT nasal spray Place 2 sprays into both nostrils daily. Pt needs now 48 g 1  . folic acid (FOLVITE) 1 MG tablet TAKE 1 TABLET BY MOUTH ONCE DAILY 100 tablet 0  . furosemide (LASIX) 40 MG tablet Take 1 tablet by mouth  daily 90 tablet 1  . gabapentin (NEURONTIN) 300 MG capsule Take 1 capsule (300 mg total) by mouth at bedtime. 90 capsule 1  . guaiFENesin (MUCINEX) 600 MG 12 hr tablet Take 1  tablet (600 mg total) by mouth 2 (two) times daily. 60 tablet 2  . hydrALAZINE (APRESOLINE) 25 MG tablet Take 1 tablet (25 mg total) by mouth 3 (three) times daily. 270 tablet 3  . HYDROcodone-acetaminophen (NORCO) 7.5-325 MG tablet Take 1 tablet by mouth every 6 (six) hours as needed for moderate pain (Must last 30 days.). 70 tablet 0  . isosorbide mononitrate (IMDUR) 60 MG 24 hr tablet Take 1 tablet (60 mg total) by mouth daily. 90 tablet 1  . meclizine (ANTIVERT) 25 MG tablet Take 1 tablet (25 mg total) by mouth 3 (three) times daily as needed for dizziness. 30 tablet 0  . metolazone (ZAROXOLYN) 2.5 MG tablet TAKE 1 TABLET ONCE A WEEK 12 tablet 2  . metoprolol succinate (TOPROL XL) 100 MG 24 hr tablet Take 1 tablet (100 mg total) by mouth daily. Take with or immediately following a meal. 30 tablet 5  . Multiple Vitamin (MULTIVITAMIN) LIQD Take 5 mLs by mouth daily.    . ondansetron (ZOFRAN) 4 MG tablet Take 1 tablet (4 mg total) by mouth daily as needed for nausea or vomiting. 90 tablet 1  . Polyethyl [name] (SYSTANE OP) Place 1 drop into both eyes daily as needed (dry eyes).    . potassium chloride SA (KLOR-CON M20) 20 MEQ tablet Take 1 tablet by mouth  daily 90 tablet 1  . pravastatin (PRAVACHOL) 40 MG tablet Take 1 tablet (40 mg total) by mouth every evening. 90 tablet 3  . SYNTHROID 75 MCG tablet Take 1 tablet by mouth  daily 90 tablet 1  . UNABLE TO FIND Shower chair x 1  Dx high fall risk 1 each 0   Facility-Administered Medications Prior to Visit  Medication Dose Route Frequency Provider Last Rate Last Dose  . epoetin alfa (EPOGEN,PROCRIT) injection 40,000 Units  40,000 Units Subcutaneous Once Farrel Gobble, MD         Allergies:   Penicillins; Aspirin; Fentanyl; and Niacin   Social History   Socioeconomic History  . Marital status: Widowed    Spouse name: Not on file  . Number of children: 0  . Years of education: Not on file  . Highest education level: Not  on file  Occupational History  . Occupation: retired  Employer: RETIRED  Social Needs  . Financial resource strain: Not on file  . Food insecurity:    Worry: Not on file    Inability: Not on file  . Transportation needs:    Medical: Not on file    Non-medical: Not on file  Tobacco Use  . Smoking status: Former Smoker    Packs/day: 0.75    Years: 40.00    Pack years: 30.00    Types: Cigarettes    Last attempt to quit: 03/08/1987    Years since quitting: 31.0  . Smokeless tobacco: Never Used  Substance and Sexual Activity  . Alcohol use: No    Alcohol/week: 0.0 oz  . Drug use: No  . Sexual activity: Not Currently  Lifestyle  . Physical activity:    Days per week: Not on file    Minutes per session: Not on file  . Stress: Not on file  Relationships  . Social connections:    Talks on phone: Not on file    Gets together: Not on file    Attends religious service: Not on file    Active member of club or organization: Not on file    Attends meetings of clubs or organizations: Not on file    Relationship status: Not on file  Other Topics Concern  . Not on file  Social History Narrative   From [place] ([place])     Family History:  The patient's family history includes Diabetes in her brother and brother; Heart attack in her sister; Heart disease in her brother, brother, father, and sister; Hypertension in her brother; Lung cancer in her brother; Pancreatic cancer in her mother.   ROS:   Please see the history of present illness.    ROS All other systems reviewed and are negative.   PHYSICAL EXAM:   VS:  BP (!) 99/52   Pulse 73   Ht 5\' 7"  (1.702 m)   Wt 202 lb 12.8 oz (92 kg)   SpO2 92%   BMI 31.76 kg/m      General: Alert, oriented x3, no distress, obese Head: no evidence of trauma, PERRL, EOMI, no exophtalmos or lid lag, no myxedema, no xanthelasma; normal ears, nose and oropharynx Neck: normal jugular venous pulsations and no hepatojugular reflux; brisk carotid  pulses without delay and no carotid bruits Chest: clear to auscultation, no signs of consolidation by percussion or palpation, normal fremitus, symmetrical and full respiratory excursions. Healthy ICD site Cardiovascular: normal position and quality of the apical impulse, regular rhythm, crisp prosthetic clicks, 1/6 early peaking Ao ejection murmur, no diastolic murmur, rubs or gallops Abdomen: no tenderness or distention, no masses by palpation, no abnormal pulsatility or arterial bruits, normal bowel sounds, no hepatosplenomegaly Extremities: no clubbing, cyanosis or edema; 2+ radial, ulnar and brachial pulses bilaterally; 2+ right femoral, posterior tibial and dorsalis pedis pulses; 2+ left femoral, posterior tibial and dorsalis pedis pulses; no subclavian or femoral bruits Neurological: grossly nonfocal Psych: Normal mood and affect   Wt Readings from Last 3 Encounters:  04/01/18 202 lb 12.8 oz (92 kg)  02/26/18 207 lb (93.9 kg)  02/01/18 208 lb (94.3 kg)      Studies/Labs Reviewed:   EKG:  EKG is not ordered today.  The intracardiac electrogram today demonstrates A sensed, biV paced rhythm  Recent Labs: 11/07/2017: ALT 17 12/09/2017: B Natriuretic Peptide 306.0; Magnesium 1.7; TSH 0.817 03/30/2018: BUN 54; Creatinine, Ser 1.78; Hemoglobin 9.8; Platelets 196; Potassium 3.9; Sodium 133   Lipid Panel  Component Value Date/Time   CHOL 193 06/28/2017 1008   CHOL 165 10/16/2016 1213   TRIG 254 (H) 06/28/2017 1008   HDL 38 (L) 06/28/2017 1008   HDL 31 (L) 10/16/2016 1213   CHOLHDL 5.1 (H) 06/28/2017 1008   VLDL 52 (H) 02/21/2017 1010   LDLCALC 119 (H) 06/28/2017 1008     ASSESSMENT:    1. S/P MVR (mitral valve replacement)   2. Persistent atrial fibrillation ([place])   3. CHB (complete heart block) ([facility])   4. Biventricular automatic implantable cardioverter defibrillator in situ   5. Chronic diastolic heart failure ([place])   6. S/P AVR (aortic valve replacement)   7. Other  non-autoimmune hemolytic anemias ([place])   8. Essential hypertension   9. CKD (chronic kidney disease), stage IV ([place])   10. Mild obesity      PLAN:  In order of problems listed above:  1. CHF: Reasonably well compensated.  Hard to likely assess functional status since she is so sedentary but I would suspect class II.  Both clinical exam and her OptiVol suggest she is currently euvolemic.  Most recent EF 60-65 % by echo February 2019, remarkable improvement which can be attributed to CRT.  She is on appropriate treatment with a relatively low dose of loop diuretic, hydralazine nitrates, metoprolol succinate. 2. AFib: maintaining sinus rhythm for long periods of time now. 3. CHB: Pacemaker dependent. 4. CRT-D: normal device function.  Excellent response to resynchronization therapy. 5. Mechanical mitral valve prosthesis: Echo February 2019 shows mean gradient of 4 mmHg, pressure half-time 100 ms 6. Mechanical aortic valve prosthesis: Echo February 2019 shows gradient 14 mmHg and dimensionless index 0.3 7. Prosthetic valve associated hemolysis: She is not transfusion dependent and the hemoglobin seems to be stable around 10 8. HTN: Excellent control does not have symptoms of hypotension 9. CKD 4: Stable GFR just under 30 10. Obesity: Mildly obese, additional weight loss would be beneficial    Medication Adjustments/Labs and Tests Ordered: Current medicines are reviewed at length with the patient today.  Concerns regarding medicines are outlined above.  Medication changes, Labs and Tests ordered today are listed in the Patient Instructions below. Patient Instructions  Dr Sallyanne Kuster recommends that you schedule a follow-up appointment in 4 months.  If you need a refill on your cardiac medications before your next appointment, please call your pharmacy.    Signed, Sanda Klein, MD  04/03/2018 6:28 PM    [address], [place],   [address] Phone:  [phone]; Fax: [phone]

## 2018-04-04 DIAGNOSIS — M5136 Other intervertebral disc degeneration, lumbar region: Secondary | ICD-10-CM | POA: Diagnosis not present

## 2018-04-04 DIAGNOSIS — I509 Heart failure, unspecified: Secondary | ICD-10-CM | POA: Diagnosis not present

## 2018-04-04 DIAGNOSIS — I110 Hypertensive heart disease with heart failure: Secondary | ICD-10-CM | POA: Diagnosis not present

## 2018-04-04 DIAGNOSIS — M109 Gout, unspecified: Secondary | ICD-10-CM | POA: Diagnosis not present

## 2018-04-04 DIAGNOSIS — I4891 Unspecified atrial fibrillation: Secondary | ICD-10-CM | POA: Diagnosis not present

## 2018-04-04 DIAGNOSIS — J449 Chronic obstructive pulmonary disease, unspecified: Secondary | ICD-10-CM | POA: Diagnosis not present

## 2018-04-05 DIAGNOSIS — H0589 Other disorders of orbit: Secondary | ICD-10-CM | POA: Diagnosis not present

## 2018-04-08 ENCOUNTER — Ambulatory Visit: Payer: BLUE CROSS/BLUE SHIELD | Admitting: Family Medicine

## 2018-04-08 DIAGNOSIS — M5136 Other intervertebral disc degeneration, lumbar region: Secondary | ICD-10-CM | POA: Diagnosis not present

## 2018-04-08 DIAGNOSIS — I509 Heart failure, unspecified: Secondary | ICD-10-CM | POA: Diagnosis not present

## 2018-04-08 DIAGNOSIS — M109 Gout, unspecified: Secondary | ICD-10-CM | POA: Diagnosis not present

## 2018-04-08 DIAGNOSIS — I4891 Unspecified atrial fibrillation: Secondary | ICD-10-CM | POA: Diagnosis not present

## 2018-04-08 DIAGNOSIS — J449 Chronic obstructive pulmonary disease, unspecified: Secondary | ICD-10-CM | POA: Diagnosis not present

## 2018-04-08 DIAGNOSIS — I110 Hypertensive heart disease with heart failure: Secondary | ICD-10-CM | POA: Diagnosis not present

## 2018-04-09 DIAGNOSIS — I10 Essential (primary) hypertension: Secondary | ICD-10-CM | POA: Diagnosis not present

## 2018-04-09 DIAGNOSIS — E559 Vitamin D deficiency, unspecified: Secondary | ICD-10-CM | POA: Diagnosis not present

## 2018-04-11 DIAGNOSIS — I509 Heart failure, unspecified: Secondary | ICD-10-CM | POA: Diagnosis not present

## 2018-04-11 DIAGNOSIS — I110 Hypertensive heart disease with heart failure: Secondary | ICD-10-CM | POA: Diagnosis not present

## 2018-04-11 DIAGNOSIS — M109 Gout, unspecified: Secondary | ICD-10-CM | POA: Diagnosis not present

## 2018-04-11 DIAGNOSIS — J449 Chronic obstructive pulmonary disease, unspecified: Secondary | ICD-10-CM | POA: Diagnosis not present

## 2018-04-11 DIAGNOSIS — M5136 Other intervertebral disc degeneration, lumbar region: Secondary | ICD-10-CM | POA: Diagnosis not present

## 2018-04-11 DIAGNOSIS — I4891 Unspecified atrial fibrillation: Secondary | ICD-10-CM | POA: Diagnosis not present

## 2018-04-11 LAB — LIPID PANEL
Cholesterol: 159 mg/dL (ref <200)
HDL: 32 mg/dL — ABNORMAL LOW (ref >50)
LDL Cholesterol (Calc): 95 mg/dL (calc)
Non-HDL Cholesterol (Calc): 127 mg/dL (calc) (ref <130)
Total CHOL/HDL Ratio: 5 (calc) — ABNORMAL HIGH (ref <5.0)
Triglycerides: 230 mg/dL — ABNORMAL HIGH (ref <150)

## 2018-04-11 LAB — BASIC METABOLIC PANEL
BUN/Creatinine Ratio: 28 (calc) — ABNORMAL HIGH (ref 6–22)
BUN: 53 mg/dL — ABNORMAL HIGH (ref 7–25)
CO2: 29 mmol/L (ref 20–32)
Calcium: 9.6 mg/dL (ref 8.6–10.4)
Chloride: 97 mmol/L — ABNORMAL LOW (ref 98–110)
Creat: 1.91 mg/dL — ABNORMAL HIGH (ref 0.60–0.88)
Glucose, Bld: 90 mg/dL (ref 65–139)
Potassium: 4.4 mmol/L (ref 3.5–5.3)
Sodium: 135 mmol/L (ref 135–146)

## 2018-04-11 LAB — CBC
HCT: 30.1 % — ABNORMAL LOW (ref 35.0–45.0)
Hemoglobin: 9.8 g/dL — ABNORMAL LOW (ref 11.7–15.5)
MCH: 28.9 pg (ref 27.0–33.0)
MCHC: 32.6 g/dL (ref 32.0–36.0)
MCV: 88.8 fL (ref 80.0–100.0)
MPV: 11.4 fL (ref 7.5–12.5)
Platelets: 206 10*3/uL (ref 140–400)
RBC: 3.39 10*6/uL — ABNORMAL LOW (ref 3.80–5.10)
RDW: 14.9 % (ref 11.0–15.0)
WBC: 5.7 10*3/uL (ref 3.8–10.8)

## 2018-04-11 LAB — HEMOGLOBIN A1C W/OUT EAG: Hgb A1c MFr Bld: 5.8 % of total Hgb — ABNORMAL HIGH (ref <5.7)

## 2018-04-11 LAB — VITAMIN D 25 HYDROXY (VIT D DEFICIENCY, FRACTURES): Vit D, 25-Hydroxy: 37 ng/mL (ref 30–100)

## 2018-04-11 LAB — TSH: TSH: 1.07 mIU/L (ref 0.40–4.50)

## 2018-04-15 ENCOUNTER — Other Ambulatory Visit: Payer: Self-pay | Admitting: Family Medicine

## 2018-04-15 DIAGNOSIS — Z1231 Encounter for screening mammogram for malignant neoplasm of breast: Secondary | ICD-10-CM

## 2018-04-16 ENCOUNTER — Ambulatory Visit (INDEPENDENT_AMBULATORY_CARE_PROVIDER_SITE_OTHER): Payer: Medicare Other | Admitting: Family Medicine

## 2018-04-16 ENCOUNTER — Other Ambulatory Visit: Payer: Self-pay

## 2018-04-16 ENCOUNTER — Other Ambulatory Visit: Payer: Self-pay | Admitting: Family Medicine

## 2018-04-16 ENCOUNTER — Encounter: Payer: Self-pay | Admitting: Family Medicine

## 2018-04-16 VITALS — BP 122/60 | HR 70 | Ht 67.0 in | Wt 204.1 lb

## 2018-04-16 DIAGNOSIS — I255 Ischemic cardiomyopathy: Secondary | ICD-10-CM

## 2018-04-16 DIAGNOSIS — G4734 Idiopathic sleep related nonobstructive alveolar hypoventilation: Secondary | ICD-10-CM | POA: Diagnosis not present

## 2018-04-16 DIAGNOSIS — G8929 Other chronic pain: Secondary | ICD-10-CM

## 2018-04-16 DIAGNOSIS — M25551 Pain in right hip: Secondary | ICD-10-CM

## 2018-04-16 DIAGNOSIS — I10 Essential (primary) hypertension: Secondary | ICD-10-CM | POA: Diagnosis not present

## 2018-04-16 DIAGNOSIS — E785 Hyperlipidemia, unspecified: Secondary | ICD-10-CM | POA: Diagnosis not present

## 2018-04-16 DIAGNOSIS — J449 Chronic obstructive pulmonary disease, unspecified: Secondary | ICD-10-CM | POA: Diagnosis not present

## 2018-04-16 MED ORDER — FUROSEMIDE 40 MG PO TABS: ORAL_TABLET | ORAL | 1 refills | Status: DC

## 2018-04-16 NOTE — Patient Instructions (Addendum)
Wellness with Nurse and flu vaccine in [place]  MD f/u in December, call if you nedd me before  Labs are  Excellent, please reduce egg yolk and red meat,  Also fried foods  Thankful that dental work has gone very well  Careful not to fall   Please contact Dr Quitman Livings re joint pain for injection  An pain management , I will refer

## 2018-04-16 NOTE — Assessment & Plan Note (Signed)
Uncontrolled right hip pain , Dr Luna Glasgow to re evaluate and treat

## 2018-04-17 LAB — CUP PACEART REMOTE DEVICE CHECK
Battery Remaining Longevity: 11 mo
Battery Voltage: 2.86 V
Brady Statistic AP VP Percent: 77.69 %
Brady Statistic AP VS Percent: 0.01 %
Brady Statistic AS VP Percent: 22.25 %
Brady Statistic AS VS Percent: 0.05 %
Brady Statistic RA Percent Paced: 77.07 %
Brady Statistic RV Percent Paced: 99.66 %
Date Time Interrogation Session: 20190703073524
HighPow Impedance: 35 Ohm
HighPow Impedance: 43 Ohm
Implantable Lead Implant Date: 20040521
Implantable Lead Implant Date: 20071218
Implantable Lead Implant Date: 20160516
Implantable Lead Location: 753858
Implantable Lead Location: 753859
Implantable Lead Location: 753860
Implantable Lead Model: 5076
Implantable Lead Model: 6947
Implantable Pulse Generator Implant Date: 20160516
Lead Channel Impedance Value: 285 Ohm
Lead Channel Impedance Value: 304 Ohm
Lead Channel Impedance Value: 342 Ohm
Lead Channel Impedance Value: 342 Ohm
Lead Channel Impedance Value: 418 Ohm
Lead Channel Impedance Value: 456 Ohm
Lead Channel Impedance Value: 456 Ohm
Lead Channel Impedance Value: 608 Ohm
Lead Channel Impedance Value: 646 Ohm
Lead Channel Impedance Value: 665 Ohm
Lead Channel Impedance Value: 760 Ohm
Lead Channel Impedance Value: 760 Ohm
Lead Channel Impedance Value: 779 Ohm
Lead Channel Pacing Threshold Amplitude: 0.625 V
Lead Channel Pacing Threshold Amplitude: 1.5 V
Lead Channel Pacing Threshold Amplitude: 5 V
Lead Channel Pacing Threshold Pulse Width: 0.4 ms
Lead Channel Pacing Threshold Pulse Width: 0.4 ms
Lead Channel Pacing Threshold Pulse Width: 1.5 ms
Lead Channel Sensing Intrinsic Amplitude: 1.5 mV
Lead Channel Sensing Intrinsic Amplitude: 1.5 mV
Lead Channel Sensing Intrinsic Amplitude: 10.5 mV
Lead Channel Sensing Intrinsic Amplitude: 10.5 mV
Lead Channel Setting Pacing Amplitude: 2 V
Lead Channel Setting Pacing Amplitude: 2.5 V
Lead Channel Setting Pacing Amplitude: 2.5 V
Lead Channel Setting Pacing Pulse Width: 0.6 ms
Lead Channel Setting Pacing Pulse Width: 1 ms
Lead Channel Setting Sensing Sensitivity: 0.3 mV

## 2018-04-24 ENCOUNTER — Encounter: Payer: Self-pay | Admitting: Orthopaedic Surgery

## 2018-04-24 ENCOUNTER — Ambulatory Visit (INDEPENDENT_AMBULATORY_CARE_PROVIDER_SITE_OTHER): Payer: Medicare Other | Admitting: Orthopaedic Surgery

## 2018-04-24 VITALS — BP 126/69 | Temp 79.0°F | Ht 66.0 in | Wt 205.0 lb

## 2018-04-24 DIAGNOSIS — M7061 Trochanteric bursitis, right hip: Secondary | ICD-10-CM | POA: Diagnosis not present

## 2018-04-24 MED ORDER — HYDROCODONE-ACETAMINOPHEN 5-325 MG PO TABS: ORAL_TABLET | ORAL | 0 refills | Status: DC

## 2018-04-24 NOTE — Progress Notes (Signed)
PROCEDURE NOTE:  The patient request injection, verbal consent was obtained.  The right trochanteric area of the hip was prepped appropriately after time out was performed.   Sterile technique was observed and injection of 1 cc of Depo-Medrol 40 mg with several cc's of plain xylocaine. Anesthesia was provided by ethyl chloride and a 20-gauge needle was used to inject the hip area. The injection was tolerated well.  A band aid dressing was applied.  The patient was advised to apply ice later today and tomorrow to the injection sight as needed.  I have reviewed the [place] web site prior to prescribing narcotic medicine for this patient..  I will see as needed.  Call if any problem.  Precautions discussed.   Electronically Signed Sanjuana Kava, MD 7/31/20191:49 PM

## 2018-04-25 ENCOUNTER — Inpatient Hospital Stay (HOSPITAL_COMMUNITY): Payer: Medicare Other | Attending: Hematology

## 2018-04-25 ENCOUNTER — Inpatient Hospital Stay (HOSPITAL_COMMUNITY): Payer: Medicare Other

## 2018-04-25 ENCOUNTER — Encounter (HOSPITAL_COMMUNITY): Payer: Self-pay

## 2018-04-25 DIAGNOSIS — N184 Chronic kidney disease, stage 4 (severe): Secondary | ICD-10-CM

## 2018-04-25 DIAGNOSIS — D631 Anemia in chronic kidney disease: Secondary | ICD-10-CM | POA: Insufficient documentation

## 2018-04-25 DIAGNOSIS — D649 Anemia, unspecified: Secondary | ICD-10-CM

## 2018-04-25 LAB — CBC WITH DIFFERENTIAL/PLATELET
Basophils Absolute: 0 10*3/uL (ref 0.0–0.1)
Basophils Relative: 0 %
Eosinophils Absolute: 0 10*3/uL (ref 0.0–0.7)
Eosinophils Relative: 0 %
HCT: 28.2 % — ABNORMAL LOW (ref 36.0–46.0)
Hemoglobin: 9 g/dL — ABNORMAL LOW (ref 12.0–15.0)
Lymphocytes Relative: 13 %
Lymphs Abs: 0.9 10*3/uL (ref 0.7–4.0)
MCH: 29 pg (ref 26.0–34.0)
MCHC: 31.9 g/dL (ref 30.0–36.0)
MCV: 91 fL (ref 78.0–100.0)
Monocytes Absolute: 0.7 10*3/uL (ref 0.1–1.0)
Monocytes Relative: 10 %
Neutro Abs: 5.1 10*3/uL (ref 1.7–7.7)
Neutrophils Relative %: 77 %
Platelets: 174 10*3/uL (ref 150–400)
RBC: 3.1 MIL/uL — ABNORMAL LOW (ref 3.87–5.11)
RDW: 15.7 % — ABNORMAL HIGH (ref 11.5–15.5)
WBC: 6.6 10*3/uL (ref 4.0–10.5)

## 2018-04-25 MED ORDER — EPOETIN ALFA 40000 UNIT/ML IJ SOLN
INTRAMUSCULAR | Status: AC
  Filled 2018-04-25: qty 1

## 2018-04-25 MED ORDER — EPOETIN ALFA 40000 UNIT/ML IJ SOLN
40000.0000 [IU] | Freq: Once | INTRAMUSCULAR | Status: AC
  Administered 2018-04-25: 40000 [IU] via SUBCUTANEOUS

## 2018-04-25 NOTE — Progress Notes (Signed)
[name] [name] tolerated Procrit injection well without complaints or incident. Hgb 9 today. VSS Pt discharged via wheelchair in satisfactory condition accompanied by family member

## 2018-04-25 NOTE — Patient Instructions (Signed)
[facility] at [facility] Discharge Instructions  Received Procrit injection today. Follow-up as scheduled. Call clinic for any questions or concerns   Thank you for choosing [facility] at [facility] to provide your oncology and hematology care.  To afford each patient quality time with our provider, please arrive at least 15 minutes before your scheduled appointment time.   If you have a lab appointment with the Cancer Center please come in thru the  Main Entrance and check in at the main information desk  You need to re-schedule your appointment should you arrive 10 or more minutes late.  We strive to give you quality time with our providers, and arriving late affects you and other patients whose appointments are after yours.  Also, if you no show three or more times for appointments you may be dismissed from the clinic at the providers discretion.     Again, thank you for choosing [facility].  Our hope is that these requests will decrease the amount of time that you wait before being seen by our physicians.       _____________________________________________________________  Should you have questions after your visit to [facility], please contact our office at [phone] between the hours of 8:00 a.m. and 4:30 p.m.  Voicemails left after 4:00 p.m. will not be returned until the following business day.  For prescription refill requests, have your pharmacy contact our office and allow 72 hours.    Cancer Center Support Programs:   > Cancer Support Group  2nd Tuesday of the month 1pm-2pm, Journey Room   

## 2018-04-28 IMAGING — DX DG CHEST 2V
2 series · 2 of 2 positions shown · non-contrast
Comparison: Radiographs November 16, 2016.

CLINICAL DATA: Shortness of breath.

EXAM:
CHEST  2 VIEW

[chest lat]
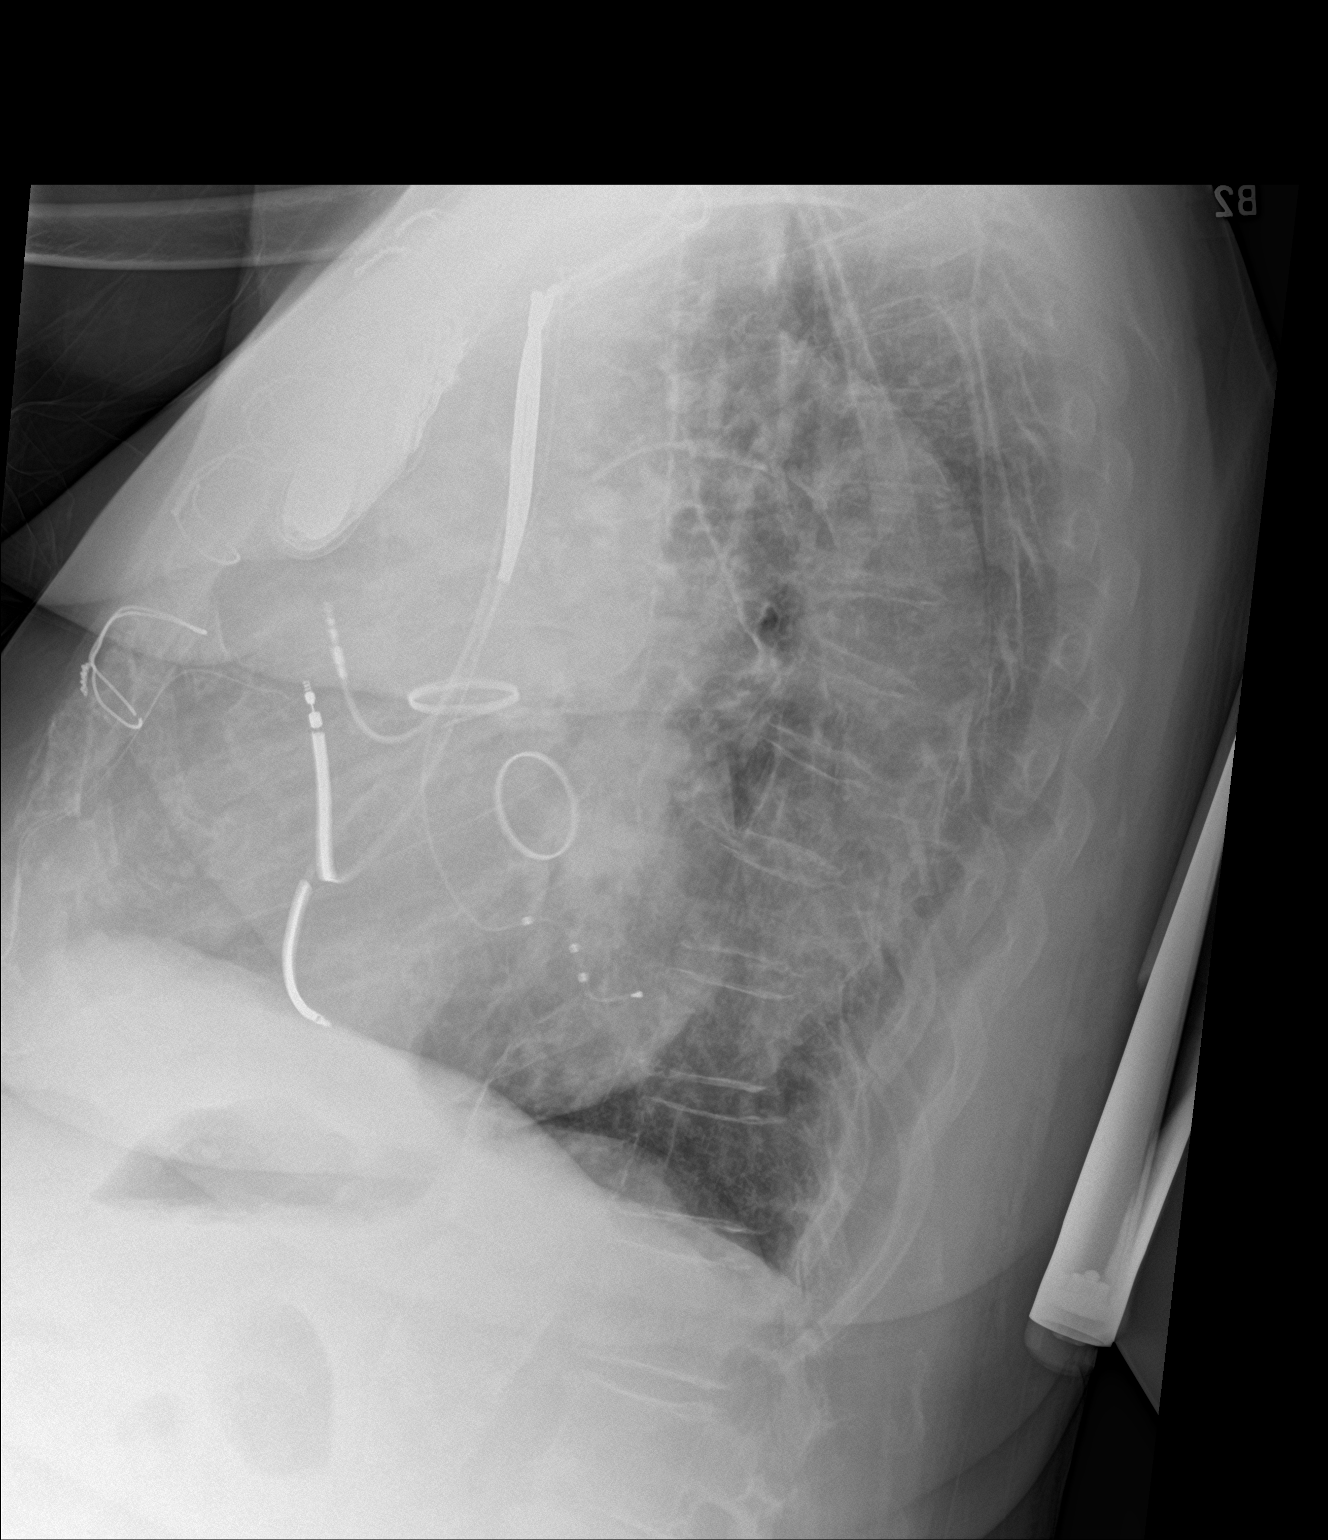

[chest ap]
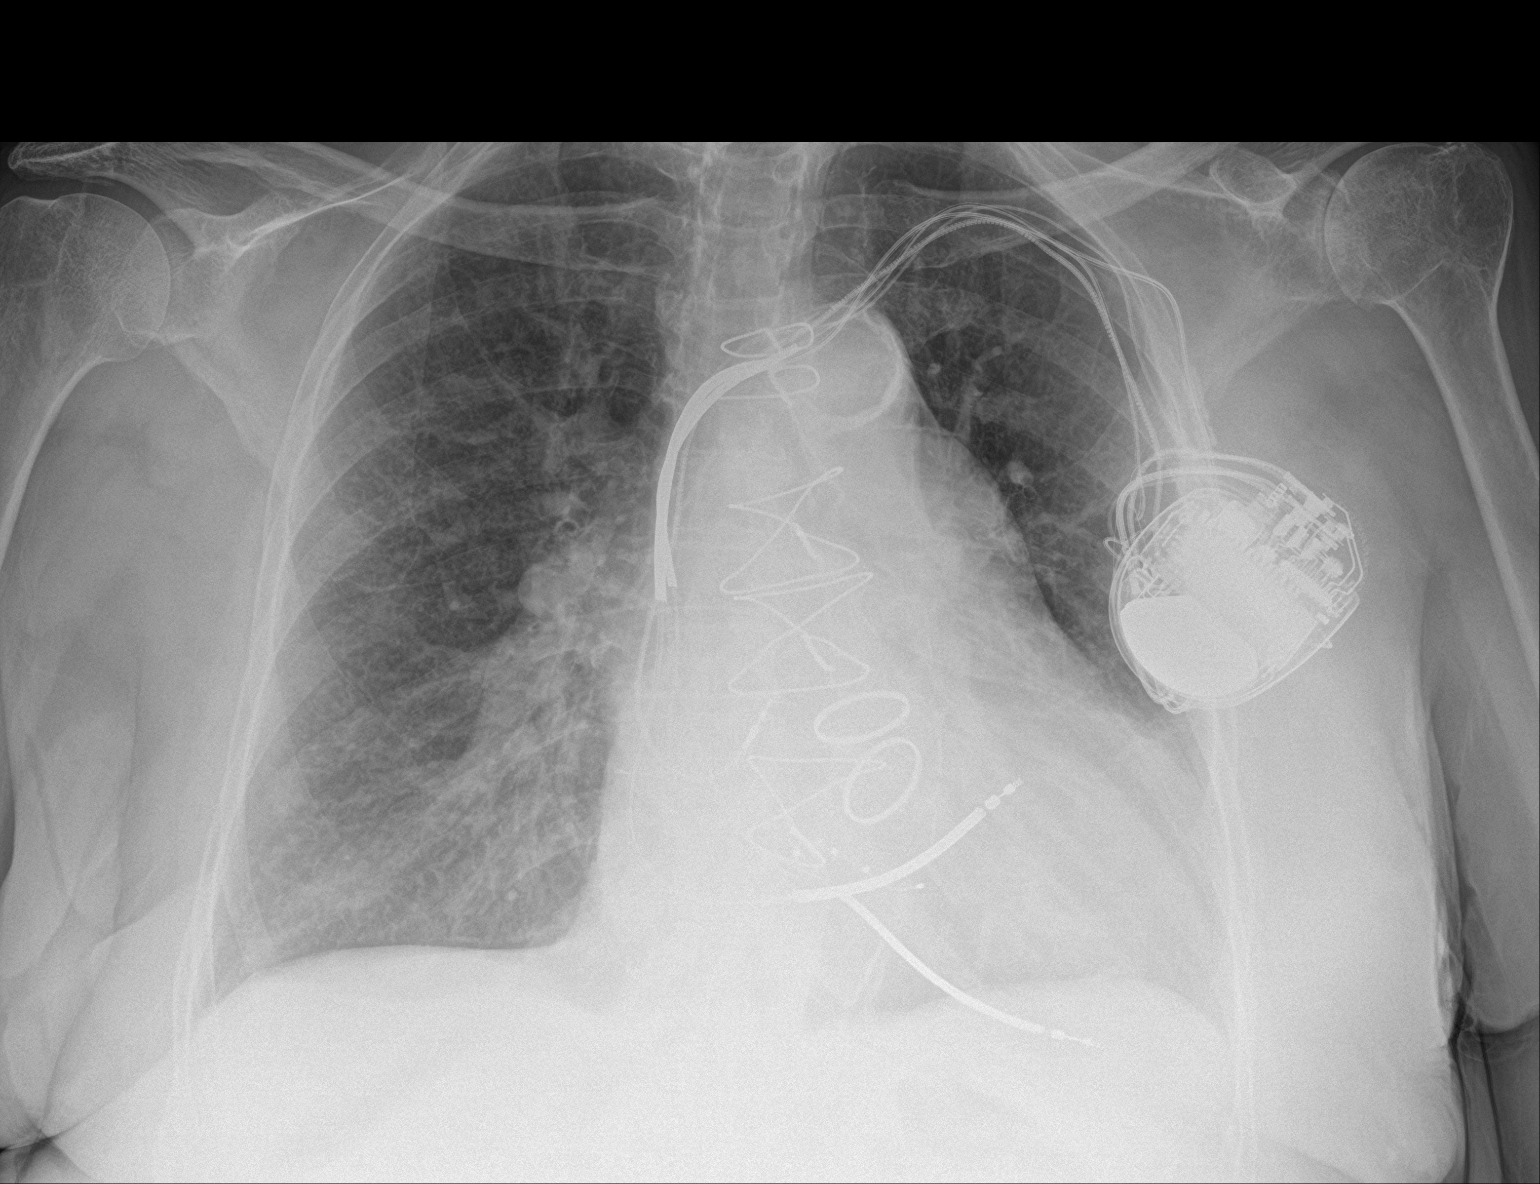

[2 of 2 positions shown; findings below may reference images not displayed]

FINDINGS: Stable cardiomegaly. Status post cardiac valve repair.
Atherosclerosis of thoracic aorta is noted. No pneumothorax or
pleural effusion is noted. Left-sided pacemaker is unchanged in
position. Stable mild central pulmonary vascular congestion is
noted. Stable reticular densities are noted throughout both lungs
most consistent scarring, but superimposed acute edema cannot be
excluded. Bony thorax is unremarkable.
IMPRESSION: Aortic atherosclerosis. Stable mild central pulmonary vascular
congestion. Stable reticular densities are noted throughout both
lungs most consistent with scarring, but superimposed acute edema
cannot be excluded.

## 2018-05-01 ENCOUNTER — Ambulatory Visit (INDEPENDENT_AMBULATORY_CARE_PROVIDER_SITE_OTHER): Payer: Medicare Other | Admitting: *Deleted

## 2018-05-01 DIAGNOSIS — I481 Persistent atrial fibrillation: Secondary | ICD-10-CM | POA: Diagnosis not present

## 2018-05-01 DIAGNOSIS — Z952 Presence of prosthetic heart valve: Secondary | ICD-10-CM

## 2018-05-01 DIAGNOSIS — Z5181 Encounter for therapeutic drug level monitoring: Secondary | ICD-10-CM | POA: Diagnosis not present

## 2018-05-01 DIAGNOSIS — I4819 Other persistent atrial fibrillation: Secondary | ICD-10-CM

## 2018-05-01 LAB — POCT INR: INR: 1.8 — AB (ref 2.0–3.0)

## 2018-05-01 NOTE — Patient Instructions (Signed)
Continue coumadin 2 tablets today and tomorrow then resume 1 tablet on Tuesdays, Thursdays and Saturdays.  Recheck in 3 weeks.

## 2018-05-02 ENCOUNTER — Ambulatory Visit (INDEPENDENT_AMBULATORY_CARE_PROVIDER_SITE_OTHER): Payer: Medicare Other

## 2018-05-02 DIAGNOSIS — I5032 Chronic diastolic (congestive) heart failure: Secondary | ICD-10-CM | POA: Diagnosis not present

## 2018-05-02 DIAGNOSIS — Z95810 Presence of automatic (implantable) cardiac defibrillator: Secondary | ICD-10-CM | POA: Diagnosis not present

## 2018-05-03 NOTE — Progress Notes (Signed)
EPIC Encounter for ICM Monitoring  Patient Name: [name] [name] is a 84 y.o. female Date: 05/03/2018 Primary Care Physican: Fayrene Helper, MD Primary Cardiologist:Croitoru Electrophysiologist:Taylor Dry Weight:202lbs Bi-V Pacing:99.5%  Clinical Status (27-Mar-2018 to 02-May-2018)  Treated VT/VF 0 episodes   AT/AF 1 episode   Time in AT/AF 18.2 hr/day (75.9%)    Observations (2) (27-Mar-2018 to 02-May-2018)  AT/AF >= 6 hr for 27 days.  Patient Activity less than 1 hr/day for 5 weeks.      Heart Failure questions reviewed, pt asymptomatic.   Appears AT/AF episode started 7/10.   She had some teeth pulled this week.   Thoracic impedance normal  Prescribed dosage: Furosemide40 mg 1 tablet daily. Metolazaone 2.5 mg Take 1tablet once a week (Wednesday). Potassium 20 mEq take 1 tablet daily.  Labs: 12/11/2017 Creatinine1.89, BUN44, Potassium4.2, [id number], [id number] 12/09/2017 Creatinine1.86, BUN51, Potassium4.1, [id number], [id number]  11/17/2017 Creatinine2.36, BUN79, Potassium4.5, [id number], [id number]  11/16/2017 Creatinine2.44, BUN68, Potassium4.1, [id number], [id number]  11/15/2017 Creatinine2.34, BUN67, Potassium5.2, [id number], [id number]  11/07/2017 Creatinine2.17, BUN55, Potassium4.7, [id number], [id number]  11/02/2017 Creatinine1.74, BUN55, Potassium4.2, [id number], [id number]  11/01/2017 Creatinine1.93, BUN54, Potassium3.9, [id number], [id number]  10/31/2017 Creatinine2.03, BUN48, Potassium4.5, [id number], [id number] A complete set of results can be found in Results Review.  Recommendations: No changes.   Encouraged to call for fluid symptoms.  Follow-up plan: ICM clinic phone appointment on 06/20/2018.   Office appointment scheduled 05/13/2018 with Dr. Lovena Le.    Copy of ICM check sent to Dr. Lovena Le and Dr Sallyanne Kuster.   3 month ICM trend: 05/02/2018    AT/AF   1 Year ICM trend:       [name],  RN 05/03/2018 12:45 PM

## 2018-05-05 ENCOUNTER — Other Ambulatory Visit: Payer: Self-pay | Admitting: Family Medicine

## 2018-05-06 ENCOUNTER — Telehealth: Payer: Self-pay | Admitting: Family Medicine

## 2018-05-06 DIAGNOSIS — J410 Simple chronic bronchitis: Secondary | ICD-10-CM

## 2018-05-06 MED ORDER — UNABLE TO FIND: Status: DC

## 2018-05-06 NOTE — Telephone Encounter (Signed)
[place] stopped by to let you know patients breathing treatment machine went out, [employer] says she needs a new prescription. Her last treatment was last night. She needs one today. Please advise. Best contact # is [phone]

## 2018-05-06 NOTE — Telephone Encounter (Signed)
Neb machine sent to [facility]

## 2018-05-07 ENCOUNTER — Telehealth: Payer: Self-pay | Admitting: *Deleted

## 2018-05-07 ENCOUNTER — Encounter: Payer: Self-pay | Admitting: Family Medicine

## 2018-05-07 MED ORDER — COUMADIN 5 MG PO TABS: ORAL_TABLET | ORAL | 3 refills | Status: DC

## 2018-05-07 NOTE — Telephone Encounter (Signed)
PT IS OUT OF HER COUMADIN 5 MG tablet [[id number]]  Please send in a Rx to [facility] [address],. She only has 2 pills left

## 2018-05-07 NOTE — Assessment & Plan Note (Signed)
Controlled, no change in medication DASH diet and commitment to daily physical activity for a minimum of 30 minutes discussed and encouraged, as a part of hypertension management. The importance of attaining a healthy weight is also discussed.  BP/Weight 04/25/2018 04/24/2018 04/16/2018 04/01/2018 03/30/2018 03/25/7792 9/0/3009  Systolic BP 233 007 622 99 633 354 562  Diastolic BP 58 69 60 52 74 66 77  Wt. (Lbs) - 205 204.08 202.8 - - 207  BMI - 33.09 31.96 31.76 - - 32.42

## 2018-05-07 NOTE — Progress Notes (Signed)
   [name] [name]     MRN: [id number]      DOB: [birth year]   HPI [name] [name] is here for follow up and re-evaluation of chronic medical conditions, medication management and review of any available recent lab and radiology data.  Preventive health is updated, specifically  Cancer screening and Immunization.   . The PT denies any adverse reactions to current medications since the last visit.  C/o  Ongoing right hip which she will need to return to local porthopedics for, tried Doc in [place] but not better. Uses assistibve device for ambulation all the time and has not fallen C/o shortness of breath and cough with chest coto helngestion and wheezing , she relies on regular breathing treatments at home    ROS Denies recent fever or chills. Denies sinus pressure, nasal congestion, ear pain or sore throat.  Denies chest pains, palpitations and leg swelling Denies abdominal pain, nausea, vomiting,diarrhea or constipation.   Denies dysuria, frequency, hesitancy, c/o incontinence  Denies headaches, seizures, numbness, or tingling. Denies depression, anxiety or insomnia. Denies skin break down or rash.   PE  BP 122/60 (BP Location: Right Arm, Patient Position: Sitting, Cuff Size: Normal)   Pulse 70   Ht 5\' 7"  (1.702 m)   Wt 204 lb 1.3 oz (92.6 kg)   SpO2 96%   BMI 31.96 kg/m   Patient alert and oriented and in no cardiopulmonary distress.  HEENT: No facial asymmetry, EOMI,   oropharynx pink and moist.  Neck decreased air entry bilaterally few wheezes, no JVD, no mass.  Chest: Clear to auscultation bilaterally.  CVS: S1, S2 no murmurs, no S3.Regular rate.  ABD: Soft non tender.   Ext: No edema  MS: Adequate ROM spine, shoulders, hips and knees.  Skin: Intact, no ulcerations or rash noted.  Psych: Good eye contact, normal affect. Memory intact not anxious or depressed appearing.  CNS: CN 2-12 intact, power,  normal throughout.no focal deficits noted.   Assessment &  Plan  Chronic right hip pain Uncontrolled right hip pain , Dr Luna Glasgow to re evaluate and treat  COPD (chronic obstructive pulmonary disease) (HCC) Intermittent wheezing with shortness of breath and cough requiring neb treatments at home when patient has a flare for of respiratory distress. Needs to have home  Equipment of nebulizing machine and treatments for breathing in the event of respiratory decompensation  Nocturnal hypoxia Ongoing need for supplemental oxygen at night, due to both heart failure and chronic respiratory disease  Essential hypertension Controlled, no change in medication DASH diet and commitment to daily physical activity for a minimum of 30 minutes discussed and encouraged, as a part of hypertension management. The importance of attaining a healthy weight is also discussed.  BP/Weight 04/25/2018 04/24/2018 04/16/2018 04/01/2018 03/30/2018 1/70/0174 05/29/4966  Systolic BP 591 638 466 99 599 357 017  Diastolic BP 58 69 60 52 74 66 77  Wt. (Lbs) - 205 204.08 202.8 - - 207  BMI - 33.09 31.96 31.76 - - 32.42       Hyperlipidemia LDL goal <70 Hyperlipidemia:Low fat diet discussed and encouraged.   Lipid Panel  Lab Results  Component Value Date   CHOL 159 04/09/2018   HDL 32 (L) 04/09/2018   LDLCALC 95 04/09/2018   TRIG 230 (H) 04/09/2018   CHOLHDL 5.0 (H) 04/09/2018   Needs to reduce fried and fatty foods, elevated TG

## 2018-05-07 NOTE — Assessment & Plan Note (Signed)
Hyperlipidemia:Low fat diet discussed and encouraged.   Lipid Panel  Lab Results  Component Value Date   CHOL 159 04/09/2018   HDL 32 (L) 04/09/2018   LDLCALC 95 04/09/2018   TRIG 230 (H) 04/09/2018   CHOLHDL 5.0 (H) 04/09/2018   Needs to reduce fried and fatty foods, elevated TG

## 2018-05-07 NOTE — Progress Notes (Signed)
   [name] [name]     MRN: [id number]      DOB: 1932/02/15   HPI [name] [name] is here for follow up and re-evaluation of chronic medical conditions, medication management and review of any available recent lab and radiology data.  Preventive health is updated, specifically  Cancer screening and Immunization.   Still c/o a lot of right  hip pain limiting her mobility, and she will schedule appointment with local ortho for injections and pain management. C/o excess wheezing and shortness  Of breath with cough intermittently and relies on in home neb treatments , needs documentation of her need for home equipment necessary for this   ROS Denies recent fever or chills. Denies sinus pressure, nasal congestion, ear pain or sore throat.  Denies chest pains, palpitations and leg swelling Denies abdominal pain, nausea, vomiting,diarrhea or constipation.   Denies dysuria, frequency, hesitancy has incontinence.  Denies headaches, seizures, numbness, or tingling. Denies depression, anxiety or insomnia. Denies skin break down or rash.   PE  BP 122/60 (BP Location: Right Arm, Patient Position: Sitting, Cuff Size: Normal)   Pulse 70   Ht 5\' 7"  (1.702 m)   Wt 204 lb 1.3 oz (92.6 kg)   SpO2 96%   BMI 31.96 kg/m   Patient alert and oriented and in no cardiopulmonary distress at rest  HEENT: No facial asymmetry, EOMI,   oropharynx pink and moist.  Neck decreased ROM no JVD, no mass.  Chest: decreased air entry,  Bilateral wheezes, no crackles  CVS: S1, S2 systolic  murmur, click, no S3.irRegular rate.  ABD: Soft non tender.   Ext: No edema  MS: decreased  ROM spine, shoulders, hips and knees.  Skin: Intact, no ulcerations or rash noted.  Psych: Good eye contact, normal affect. Memory intact not anxious or depressed appearing.  CNS: CN 2-12 intact, power,  normal throughout.no focal deficits noted.   Assessment & Plan  Chronic right hip pain Uncontrolled right hip pain , Dr [name] [name]  to re evaluate and treat  COPD (chronic obstructive pulmonary disease) (HCC) Intermittent wheezing with shortness of breath and cough requiring neb treatments at home when patient has a flare for of respiratory distress. Needs to have home  Equipment of nebulizing machine and treatments for breathing in the event of respiratory decompensation  Nocturnal hypoxia Ongoing need for supplemental oxygen at night, due to both heart failure and chronic respiratory disease  Essential hypertension Controlled, no change in medication DASH diet and commitment to daily physical activity for a minimum of 30 minutes discussed and encouraged, as a part of hypertension management. The importance of attaining a healthy weight is also discussed.  BP/Weight 04/25/2018 04/24/2018 04/16/2018 04/01/2018 03/30/2018 7/34/2876 04/26/1571  Systolic BP 620 355 974 99 163 845 364  Diastolic BP 58 69 60 52 74 66 77  Wt. (Lbs) - 205 204.08 202.8 - - 207  BMI - 33.09 31.96 31.76 - - 32.42       Hyperlipidemia LDL goal <70 Hyperlipidemia:Low fat diet discussed and encouraged.   Lipid Panel  Lab Results  Component Value Date   CHOL 159 04/09/2018   HDL 32 (L) 04/09/2018   LDLCALC 95 04/09/2018   TRIG 230 (H) 04/09/2018   CHOLHDL 5.0 (H) 04/09/2018   Needs to reduce fried and fatty foods, elevated TG

## 2018-05-07 NOTE — Assessment & Plan Note (Signed)
Intermittent wheezing with shortness of breath and cough requiring neb treatments at home when patient has a flare for of respiratory distress. Needs to have home  Equipment of nebulizing machine and treatments for breathing in the event of respiratory decompensation

## 2018-05-07 NOTE — Assessment & Plan Note (Signed)
Ongoing need for supplemental oxygen at night, due to both heart failure and chronic respiratory disease

## 2018-05-13 ENCOUNTER — Ambulatory Visit (INDEPENDENT_AMBULATORY_CARE_PROVIDER_SITE_OTHER): Payer: Medicare Other | Admitting: Internal Medicine

## 2018-05-13 ENCOUNTER — Encounter: Payer: Self-pay | Admitting: Internal Medicine

## 2018-05-13 VITALS — BP 128/58 | HR 89 | Ht 67.0 in | Wt 201.0 lb

## 2018-05-13 DIAGNOSIS — I255 Ischemic cardiomyopathy: Secondary | ICD-10-CM | POA: Diagnosis not present

## 2018-05-13 DIAGNOSIS — I442 Atrioventricular block, complete: Secondary | ICD-10-CM

## 2018-05-13 DIAGNOSIS — I5042 Chronic combined systolic (congestive) and diastolic (congestive) heart failure: Secondary | ICD-10-CM | POA: Diagnosis not present

## 2018-05-13 LAB — CUP PACEART INCLINIC DEVICE CHECK
Battery Remaining Longevity: 9 mo
Battery Voltage: 2.86 V
Brady Statistic AP VP Percent: 60.38 %
Brady Statistic AP VS Percent: 0.02 %
Brady Statistic AS VP Percent: 39.45 %
Brady Statistic AS VS Percent: 0.14 %
Brady Statistic RA Percent Paced: 59.63 %
Brady Statistic RV Percent Paced: 99.55 %
Date Time Interrogation Session: 20190819135303
HighPow Impedance: 38 Ohm
HighPow Impedance: 48 Ohm
Implantable Lead Implant Date: 20040521
Implantable Lead Implant Date: 20071218
Implantable Lead Implant Date: 20160516
Implantable Lead Location: 753858
Implantable Lead Location: 753859
Implantable Lead Location: 753860
Implantable Lead Model: 5076
Implantable Lead Model: 6947
Implantable Pulse Generator Implant Date: 20160516
Lead Channel Impedance Value: 304 Ohm
Lead Channel Impedance Value: 342 Ohm
Lead Channel Impedance Value: 361 Ohm
Lead Channel Impedance Value: 399 Ohm
Lead Channel Impedance Value: 418 Ohm
Lead Channel Impedance Value: 456 Ohm
Lead Channel Impedance Value: 513 Ohm
Lead Channel Impedance Value: 760 Ohm
Lead Channel Impedance Value: 760 Ohm
Lead Channel Impedance Value: 760 Ohm
Lead Channel Impedance Value: 779 Ohm
Lead Channel Impedance Value: 836 Ohm
Lead Channel Impedance Value: 874 Ohm
Lead Channel Pacing Threshold Amplitude: 0.5 V
Lead Channel Pacing Threshold Amplitude: 1.875 V
Lead Channel Pacing Threshold Amplitude: 2.25 V
Lead Channel Pacing Threshold Pulse Width: 0.4 ms
Lead Channel Pacing Threshold Pulse Width: 0.4 ms
Lead Channel Pacing Threshold Pulse Width: 1 ms
Lead Channel Sensing Intrinsic Amplitude: 10.5 mV
Lead Channel Sensing Intrinsic Amplitude: 10.5 mV
Lead Channel Sensing Intrinsic Amplitude: 2 mV
Lead Channel Sensing Intrinsic Amplitude: 2.125 mV
Lead Channel Setting Pacing Amplitude: 2 V
Lead Channel Setting Pacing Amplitude: 2.5 V
Lead Channel Setting Pacing Amplitude: 3 V
Lead Channel Setting Pacing Pulse Width: 0.6 ms
Lead Channel Setting Pacing Pulse Width: 1 ms
Lead Channel Setting Sensing Sensitivity: 0.3 mV

## 2018-05-13 NOTE — Patient Instructions (Signed)
Medication Instructions:  Your physician recommends that you continue on your current medications as directed. Please refer to the Current Medication list given to you today.   Labwork: NONE   Testing/Procedures: NONE   Follow-Up: Your physician wants you to follow-up in: 1 Year with Dr. Taylor. You will receive a reminder letter in the mail two months in advance. If you don't receive a letter, please call our office to schedule the follow-up appointment.   Any Other Special Instructions Will Be Listed Below (If Applicable).     If you need a refill on your cardiac medications before your next appointment, please call your pharmacy.  Thank you for choosing [facility]!   

## 2018-05-13 NOTE — Progress Notes (Signed)
HPI [name] [name] returns today for followup. 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 Her old ICD lead had broken.  She has not had any hospitalization for CHF since her last visit. No chest pain. She is tolerating her Warfarin. She has an ASA allergy.  Allergies  Allergen Reactions  . Penicillins Other (See Comments)    Bruise Has patient had a PCN reaction causing immediate rash, facial/tongue/throat swelling, SOB or lightheadedness with hypotension: no Has patient had a PCN reaction causing severe rash involving mucus membranes or skin necrosis: no Has patient had a PCN reaction that required hospitalization: no Has patient had a PCN reaction occurring within the last 10 years: no If all of the above answers are "NO", then may proceed with Cephalosporin use.   . Aspirin Other (See Comments)    On coumadin  . Fentanyl Dermatitis    Rash with patch   . Niacin Itching     Current Outpatient Medications  Medication Sig Dispense Refill  . acetaminophen (TYLENOL) 500 MG tablet Take 500 mg by mouth every 6 (six) hours as needed (pain).    Marland Kitchen albuterol (PROVENTIL) (2.5 MG/3ML) 0.083% nebulizer solution INHALE 1 VIAL VIA NEBULIZER THREE TIMES DAILY. (Patient taking differently: INHALE 1 VIAL VIA NEBULIZER THREE TIMES DAILY AS NEEDED) 90 vial 5  . albuterol (VENTOLIN HFA) 108 (90 Base) MCG/ACT inhaler Inhale 2 puffs into the lungs every 6 (six) hours as needed for wheezing or shortness of breath. 3 Inhaler 3  . allopurinol (ZYLOPRIM) 100 MG tablet TAKE 1 TABLET DAILY 90 tablet 1  . azelastine (ASTELIN) 0.1 % nasal spray Place 2 sprays into both nostrils daily as needed for allergies. Use in each nostril as directed    . BREO ELLIPTA 100-25 MCG/INH AEPB Inhale 1 puff  into the lungs daily.   1  . calcium carbonate (OS-CAL) 1250 (500 Ca) MG chewable tablet Chew 1 tablet by mouth daily.    . cholecalciferol (VITAMIN D) 1000 UNITS tablet Take 1,000 Units by mouth daily.    Marland Kitchen COUMADIN 5 MG tablet Take 1 tablet Tuesday, Thursday, and Saturday and  Take 2 tablets all other days. 45 tablet 3  . Darbepoetin Alfa (ARANESP, ALBUMIN FREE, IJ) EVERY THURSDAY    . digoxin (LANOXIN) 0.125 MG tablet Take 1 tablet (0.125 mg total) by mouth every other day. 45 tablet 3  . docusate sodium (COLACE) 100 MG capsule Take 300 mg by mouth at bedtime.    . fluticasone (FLONASE) 50 MCG/ACT nasal spray Place 2 sprays into both nostrils daily. Pt needs now 48 g 1  . folic acid (FOLVITE) 1 MG tablet TAKE 1 TABLET BY MOUTH ONCE DAILY 100 tablet 0  . furosemide (LASIX) 40 MG tablet Take 1 tablet by mouth  daily 90 tablet 1  . gabapentin (NEURONTIN) 300 MG capsule TAKE 1 CAPSULE BY MOUTH AT BEDTIME 90 capsule 0  . guaiFENesin (MUCINEX) 600 MG 12 hr tablet Take 1 tablet (600 mg total) by mouth 2 (two) times daily. 60 tablet 2  . hydrALAZINE (APRESOLINE) 25 MG tablet Take 1 tablet (25 mg total) by mouth 3 (three) times daily. 270 tablet 3  . HYDROcodone-acetaminophen (NORCO/VICODIN) 5-325 MG tablet One tablet every four hours as needed for  acute pain.  Limit of five days per [place] statue. 30 tablet 0  . isosorbide mononitrate (IMDUR) 60 MG 24 hr tablet Take 1 tablet (60 mg total) by mouth daily. 90 tablet 1  . meclizine (ANTIVERT) 25 MG tablet Take 1 tablet (25 mg total) by mouth 3 (three) times daily as needed for dizziness. 30 tablet 0  . metolazone (ZAROXOLYN) 2.5 MG tablet TAKE 1 TABLET ONCE A WEEK 12 tablet 2  . metoprolol succinate (TOPROL XL) 100 MG 24 hr tablet Take 1 tablet (100 mg total) by mouth daily. Take with or immediately following a meal. 30 tablet 5  . Multiple Vitamin (MULTIVITAMIN) LIQD Take 5 mLs by mouth daily.    . ondansetron (ZOFRAN) 4 MG tablet Take 1 tablet (4 mg  total) by mouth daily as needed for nausea or vomiting. 90 tablet 1  . Polyethyl [name] (SYSTANE OP) Place 1 drop into both eyes daily as needed (dry eyes).    . potassium chloride SA (KLOR-CON M20) 20 MEQ tablet Take 1 tablet by mouth  daily 90 tablet 1  . pravastatin (PRAVACHOL) 40 MG tablet Take 1 tablet (40 mg total) by mouth every evening. 90 tablet 3  . SYNTHROID 75 MCG tablet TAKE 1 TABLET DAILY 90 tablet 1  . UNABLE TO FIND Shower chair x 1  Dx high fall risk 1 each 0  . UNABLE TO FIND Nebulizer machine with tubing x 1  Diagnosis: J44.9 1 each 0Nebulizer   No current facility-administered medications for this visit.    Facility-Administered Medications Ordered in Other Visits  Medication Dose Route Frequency Provider Last Rate Last Dose  . epoetin alfa (EPOGEN,PROCRIT) injection 40,000 Units  40,000 Units Subcutaneous Once Farrel Gobble, MD         Past Medical History:  Diagnosis Date  . Adenomatous polyp of colon 12/16/2002   Dr. [name]/[facility]  . Allergy   . Cataract   . CHB (complete heart block) ([place]) 10/07/2014      . CHF (congestive heart failure) ([place])    a.  EF previously reduced to 25-30% b. EF improved to 60-65% by echo in 10/2017.   Marland Kitchen Chronic kidney disease, stage I 2006  . Constipation       . COPD (chronic obstructive pulmonary disease) ([place])       . DJD (degenerative joint disease) of lumbar spine   . DM (diabetes mellitus) ([place]) 2006  . Esophageal reflux   . Glaucoma   . Gout       . H/O heart valve replacement with mechanical valve    a. s/p mechanical AVR in 2001 and mechanical MVR in 2004.  Marland Kitchen H/O wisdom tooth extraction   . Hyperlipemia   . IDA (iron deficiency anemia)    Parenteral iron/[name]  . Insomnia       . Non-ischemic cardiomyopathy ([place])    a. s/p MDT CRTD  . Obesity, unspecified   . Other and unspecified hyperlipidemia   . Oxygen deficiency 2014   nocturnal;  .  Spondylolisthesis   . Spondylosis   . Thyroid cancer ([place])    remote thyroidectomy, no recurrence, pt denies in 2016 thyroid cancer  . Unspecified hypothyroidism       . 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 Vertigo         ROS:   All systems reviewed and negative except as noted in the HPI.   Past  Surgical History:  Procedure Laterality Date  . AORTIC AND MITRAL VALVE REPLACEMENT  2001  . BI-VENTRICULAR IMPLANTABLE CARDIOVERTER DEFIBRILLATOR UPGRADE N/A 01/18/2015   Procedure: BI-VENTRICULAR IMPLANTABLE CARDIOVERTER DEFIBRILLATOR UPGRADE;  Surgeon: Evans Lance, MD;  Location: [facility] C[facility] LAB;  Service: Cardiovascular;  Laterality: N/A;  . CARDIAC CATHETERIZATION    . CARDIAC VALVE REPLACEMENT    . CARDIOVERSION N/A 11/13/2016   Procedure: CARDIOVERSION;  Surgeon: Sanda Klein, MD;  Location: [facility] ENDOSCOPY;  Service: Cardiovascular;  Laterality: N/A;  . COLONOSCOPY  06/12/2007   Rourk- Normal rectum/Normal colon  . COLONOSCOPY  12/16/02   small hemorrhoids  . COLONOSCOPY  06/20/2012   Procedure: COLONOSCOPY;  Surgeon: Daneil Dolin, MD;  Location: [facility] ENDO SUITE;  Service: Endoscopy;  Laterality: N/A;  10:45  . defibrillator implanted 2006    . DOPPLER ECHOCARDIOGRAPHY  2012  . DOPPLER ECHOCARDIOGRAPHY  05,06,07,08,09,2011  . ESOPHAGOGASTRODUODENOSCOPY  06/12/2007   Rourk- normal esophagus, small hiatal hernia, otherwise normal stomach, D1, D2  . EYE SURGERY Right 2005  . EYE SURGERY Left 2006  . ICD LEAD REMOVAL Left 02/08/2015   Procedure: ICD LEAD REMOVAL;  Surgeon: Evans Lance, MD;  Location: [facility] OR;  Service: Cardiovascular;  Laterality: Left;  "Will plan extraction and insertion of a BiV PM"  **Dr. Roxan Hockey backing up case**  . IMPLANTABLE CARDIOVERTER DEFIBRILLATOR (ICD) GENERATOR CHANGE Left 02/08/2015   Procedure: ICD GENERATOR CHANGE;  Surgeon: Evans Lance, MD;  Location: [place];  Service: Cardiovascular;  Laterality: Left;  . INSERT /  REPLACE / REMOVE PACEMAKER    . PACEMAKER INSERTION  May 2016  . pacemaker placed  2004  . right breast cyst removed     benign   . right cataract removed     2005  . TEE WITHOUT CARDIOVERSION N/A 11/13/2016   Procedure: TRANSESOPHAGEAL ECHOCARDIOGRAM (TEE);  Surgeon: Sanda Klein, MD;  Location: [facility] ENDOSCOPY;  Service: Cardiovascular;  Laterality: N/A;  . THYROIDECTOMY       Family History  Problem Relation Age of Onset  . Pancreatic cancer Mother   . Heart disease Father   . Heart disease Sister   . Heart attack Sister   . Heart disease Brother   . Diabetes Brother   . Heart disease Brother   . Diabetes Brother   . Hypertension Brother   . Lung cancer Brother      Social History   Socioeconomic History  . Marital status: Widowed    Spouse name: Not on file  . Number of children: 0  . Years of education: Not on file  . Highest education level: Not on file  Occupational History  . Occupation: retired    Fish farm manager: RETIRED  Social Needs  . Financial resource strain: Not on file  . Food insecurity:    Worry: Not on file    Inability: Not on file  . Transportation needs:    Medical: Not on file    Non-medical: Not on file  Tobacco Use  . Smoking status: Former Smoker    Packs/day: 0.75    Years: 40.00    Pack years: 30.00    Types: Cigarettes    Last attempt to quit: 03/08/1987    Years since quitting: 31.2  . Smokeless tobacco: Never Used  Substance and Sexual Activity  . Alcohol use: No    Alcohol/week: 0.0 standard drinks  . Drug use: No  . Sexual activity: Not Currently  Lifestyle  . Physical activity:  Days per week: Not on file    Minutes per session: Not on file  . Stress: Not on file  Relationships  . Social connections:    Talks on phone: Not on file    Gets together: Not on file    Attends religious service: Not on file    Active member of club or organization: Not on file    Attends meetings of clubs or organizations: Not on file     Relationship status: Not on file  . Intimate partner violence:    Fear of current or ex partner: Not on file    Emotionally abused: Not on file    Physically abused: Not on file    Forced sexual activity: Not on file  Other Topics Concern  . Not on file  Social History Narrative   [facility] ([facility]     BP (!) 128/58   Pulse 89   Ht 5\' 7"  (1.702 m)   Wt 201 lb (91.2 kg)   SpO2 95%   BMI 31.48 kg/m   Physical Exam:  Elderly appearing woman, NAD HEENT: Unremarkable Neck:  6 cm JVD, no thyromegally Lymphatics:  No adenopathy Back:  No CVA tenderness Lungs:  Clear with no wheezes HEART:  Regular rate rhythm, no murmurs, no rubs, no clicks Abd:  soft, positive bowel sounds, no organomegally, no rebound, no guarding Ext:  2 plus pulses, no edema, no cyanosis, no clubbing Skin:  No rashes no nodules Neuro:  CN II through XII intact, motor grossly intact  EKG - none  DEVICE  Normal device function.  See PaceArt for details.   Assess/Plan: 1. Chronic systolic heart failure - her symptoms remain class 2. She will continue her current meds. 2. ICD - her medtronic device is working normally although her LV threshold has gradually gone up.  3. Atrial fib - she is now out of rhythm 100% of the time. She will continue her current meds.   Mikle Bosworth.D.

## 2018-05-17 ENCOUNTER — Other Ambulatory Visit: Payer: Self-pay | Admitting: Family Medicine

## 2018-05-17 ENCOUNTER — Telehealth: Payer: Self-pay

## 2018-05-17 NOTE — Telephone Encounter (Signed)
Returned call at patient's request by voice mail message.  She stated she has a noise that is possibly coming from the oxygen tank.  She said it is tick tock noise and thought it was related to her heart but it isn't.  Advised to call [facility] and have them come and check her equipment.  Advised vendor number should be on the equipment.  She said she will give them a call.

## 2018-05-22 ENCOUNTER — Ambulatory Visit (INDEPENDENT_AMBULATORY_CARE_PROVIDER_SITE_OTHER): Payer: Medicare Other | Admitting: *Deleted

## 2018-05-22 DIAGNOSIS — Z952 Presence of prosthetic heart valve: Secondary | ICD-10-CM

## 2018-05-22 DIAGNOSIS — Z5181 Encounter for therapeutic drug level monitoring: Secondary | ICD-10-CM

## 2018-05-22 LAB — POCT INR: INR: 1.5 — AB (ref 2.0–3.0)

## 2018-05-22 MED ORDER — COUMADIN 5 MG PO TABS: ORAL_TABLET | ORAL | 0 refills | Status: DC

## 2018-05-22 NOTE — Patient Instructions (Signed)
Take coumadin 3 tablets tonight then increase dose to 2 tablets daily except 1 tablet on Tuesdays and Saturdays.  Recheck in 2 weeks.

## 2018-05-23 ENCOUNTER — Ambulatory Visit (HOSPITAL_COMMUNITY)
Admission: RE | Admit: 2018-05-23 | Discharge: 2018-05-23 | Disposition: A | Discharge status: Home / Self Care | Payer: Medicare Other | Source: Ambulatory Visit | Attending: Family Medicine | Admitting: Family Medicine

## 2018-05-23 ENCOUNTER — Encounter (HOSPITAL_COMMUNITY): Payer: Self-pay

## 2018-05-23 DIAGNOSIS — Z1231 Encounter for screening mammogram for malignant neoplasm of breast: Secondary | ICD-10-CM | POA: Insufficient documentation

## 2018-05-28 ENCOUNTER — Other Ambulatory Visit: Payer: Self-pay | Admitting: Family Medicine

## 2018-05-28 ENCOUNTER — Telehealth: Payer: Self-pay | Admitting: Family Medicine

## 2018-05-28 MED ORDER — FLUTICASONE PROPIONATE 50 MCG/ACT NA SUSP: 2.0000 | Freq: Every day | NASAL | 3 refills | Status: DC

## 2018-05-28 NOTE — Progress Notes (Signed)
FLONASE SENT TO [facility] 90 DAY SUPPLIES

## 2018-05-28 NOTE — Telephone Encounter (Signed)
[facility] called and needs RX for 50 microgram sppray 90 day  With 3 refills.  Call back # is [phone] ref # [id number]

## 2018-05-28 NOTE — Telephone Encounter (Signed)
SENT TO PHARMACY 

## 2018-06-05 ENCOUNTER — Ambulatory Visit (INDEPENDENT_AMBULATORY_CARE_PROVIDER_SITE_OTHER): Payer: Medicare Other | Admitting: *Deleted

## 2018-06-05 ENCOUNTER — Other Ambulatory Visit (HOSPITAL_COMMUNITY): Payer: Self-pay | Admitting: Internal Medicine

## 2018-06-05 DIAGNOSIS — Z952 Presence of prosthetic heart valve: Secondary | ICD-10-CM | POA: Diagnosis not present

## 2018-06-05 DIAGNOSIS — Z5181 Encounter for therapeutic drug level monitoring: Secondary | ICD-10-CM

## 2018-06-05 LAB — POCT INR: INR: 1.7 — AB (ref 2.0–3.0)

## 2018-06-05 NOTE — Patient Instructions (Signed)
Increase coumadin to 2 tablets daily except 1 tablet on Tuesdays.  Recheck in 2 weeks.

## 2018-06-06 ENCOUNTER — Inpatient Hospital Stay (HOSPITAL_COMMUNITY): Payer: Medicare Other

## 2018-06-06 ENCOUNTER — Inpatient Hospital Stay (HOSPITAL_COMMUNITY): Payer: Medicare Other | Attending: Hematology

## 2018-06-06 ENCOUNTER — Encounter (HOSPITAL_COMMUNITY): Payer: Self-pay

## 2018-06-06 ENCOUNTER — Other Ambulatory Visit: Payer: Self-pay

## 2018-06-06 VITALS — BP 124/59 | HR 77 | Temp 97.9°F | Resp 16

## 2018-06-06 DIAGNOSIS — D649 Anemia, unspecified: Secondary | ICD-10-CM | POA: Diagnosis not present

## 2018-06-06 DIAGNOSIS — N184 Chronic kidney disease, stage 4 (severe): Secondary | ICD-10-CM

## 2018-06-06 DIAGNOSIS — I129 Hypertensive chronic kidney disease with stage 1 through stage 4 chronic kidney disease, or unspecified chronic kidney disease: Secondary | ICD-10-CM | POA: Insufficient documentation

## 2018-06-06 LAB — CBC WITH DIFFERENTIAL/PLATELET
Basophils Absolute: 0 10*3/uL (ref 0.0–0.1)
Basophils Relative: 0 %
Eosinophils Absolute: 0 10*3/uL (ref 0.0–0.7)
Eosinophils Relative: 0 %
HCT: 29.7 % — ABNORMAL LOW (ref 36.0–46.0)
Hemoglobin: 9.4 g/dL — ABNORMAL LOW (ref 12.0–15.0)
Lymphocytes Relative: 16 %
Lymphs Abs: 0.8 10*3/uL (ref 0.7–4.0)
MCH: 29.2 pg (ref 26.0–34.0)
MCHC: 31.6 g/dL (ref 30.0–36.0)
MCV: 92.2 fL (ref 78.0–100.0)
Monocytes Absolute: 0.5 10*3/uL (ref 0.1–1.0)
Monocytes Relative: 10 %
Neutro Abs: 3.8 10*3/uL (ref 1.7–7.7)
Neutrophils Relative %: 74 %
Platelets: 172 10*3/uL (ref 150–400)
RBC: 3.22 MIL/uL — ABNORMAL LOW (ref 3.87–5.11)
RDW: 15.9 % — ABNORMAL HIGH (ref 11.5–15.5)
WBC: 5.1 10*3/uL (ref 4.0–10.5)

## 2018-06-06 MED ORDER — EPOETIN ALFA 40000 UNIT/ML IJ SOLN
40000.0000 [IU] | Freq: Once | INTRAMUSCULAR | Status: AC
  Administered 2018-06-06: 40000 [IU] via SUBCUTANEOUS

## 2018-06-06 MED ORDER — EPOETIN ALFA 40000 UNIT/ML IJ SOLN
INTRAMUSCULAR | Status: AC
  Filled 2018-06-06: qty 1

## 2018-06-06 NOTE — Progress Notes (Signed)
Procrit given today.see MAR . Patient tolerated it well without problems. Vitals stable and discharged home from clinic ambulatory. Follow up as scheduled.

## 2018-06-06 NOTE — Patient Instructions (Signed)
[place] at [facility] Discharge Instructions  Procrit given Follow up as scheduled.   Thank you for choosing [place] at [facility] to provide your oncology and hematology care.  To afford each patient quality time with our provider, please arrive at least 15 minutes before your scheduled appointment time.   If you have a lab appointment with the [facility] please come in thru the  Main Entrance and check in at the main information desk  You need to re-schedule your appointment should you arrive 10 or more minutes late.  We strive to give you quality time with our providers, and arriving late affects you and other patients whose appointments are after yours.  Also, if you no show three or more times for appointments you may be dismissed from the clinic at the providers discretion.     Again, thank you for choosing [facility].  Our hope is that these requests will decrease the amount of time that you wait before being seen by our physicians.       _____________________________________________________________  Should you have questions after your visit to [facility], please contact our office at [phone] between the hours of 8:00 a.m. and 4:30 p.m.  Voicemails left after 4:00 p.m. will not be returned until the following business day.  For prescription refill requests, have your pharmacy contact our office and allow 72 hours.    Cancer Center Support Programs:   > Cancer Support Group  2nd Tuesday of the month 1pm-2pm, Journey Room

## 2018-06-11 DIAGNOSIS — E1142 Type 2 diabetes mellitus with diabetic polyneuropathy: Secondary | ICD-10-CM | POA: Diagnosis not present

## 2018-06-11 DIAGNOSIS — L603 Nail dystrophy: Secondary | ICD-10-CM | POA: Diagnosis not present

## 2018-06-11 DIAGNOSIS — L851 Acquired keratosis [keratoderma] palmaris et plantaris: Secondary | ICD-10-CM | POA: Diagnosis not present

## 2018-06-13 ENCOUNTER — Ambulatory Visit (INDEPENDENT_AMBULATORY_CARE_PROVIDER_SITE_OTHER): Payer: Medicare Other | Admitting: Orthopaedic Surgery

## 2018-06-13 ENCOUNTER — Encounter: Payer: Self-pay | Admitting: Orthopaedic Surgery

## 2018-06-13 DIAGNOSIS — M7061 Trochanteric bursitis, right hip: Secondary | ICD-10-CM

## 2018-06-13 MED ORDER — HYDROCODONE-ACETAMINOPHEN 7.5-325 MG PO TABS: 1.0000 | ORAL_TABLET | Freq: Four times a day (QID) | ORAL | 0 refills | Status: DC | PRN

## 2018-06-13 NOTE — Progress Notes (Signed)
PROCEDURE NOTE:  The patient request injection, verbal consent was obtained.  The right trochanteric area of the hip was prepped appropriately after time out was performed.   Sterile technique was observed and injection of 1 cc of Depo-Medrol 40 mg with several cc's of plain xylocaine. Anesthesia was provided by ethyl chloride and a 20-gauge needle was used to inject the hip area. The injection was tolerated well.  A band aid dressing was applied.  The patient was advised to apply ice later today and tomorrow to the injection sight as needed.  I have reviewed the [facility] web site prior to prescribing narcotic medicine for this patient.   Return in two months.  Electronically Signed Sanjuana Kava, MD 9/19/20199:23 AM

## 2018-06-20 ENCOUNTER — Ambulatory Visit (INDEPENDENT_AMBULATORY_CARE_PROVIDER_SITE_OTHER): Payer: Medicare Other

## 2018-06-20 DIAGNOSIS — I5042 Chronic combined systolic (congestive) and diastolic (congestive) heart failure: Secondary | ICD-10-CM | POA: Diagnosis not present

## 2018-06-20 DIAGNOSIS — Z95810 Presence of automatic (implantable) cardiac defibrillator: Secondary | ICD-10-CM

## 2018-06-21 ENCOUNTER — Ambulatory Visit (INDEPENDENT_AMBULATORY_CARE_PROVIDER_SITE_OTHER): Payer: Medicare Other | Admitting: *Deleted

## 2018-06-21 DIAGNOSIS — Z952 Presence of prosthetic heart valve: Secondary | ICD-10-CM | POA: Diagnosis not present

## 2018-06-21 DIAGNOSIS — Z5181 Encounter for therapeutic drug level monitoring: Secondary | ICD-10-CM

## 2018-06-21 DIAGNOSIS — I4819 Other persistent atrial fibrillation: Secondary | ICD-10-CM

## 2018-06-21 DIAGNOSIS — I481 Persistent atrial fibrillation: Secondary | ICD-10-CM | POA: Diagnosis not present

## 2018-06-21 LAB — POCT INR: INR: 3 (ref 2.0–3.0)

## 2018-06-21 NOTE — Progress Notes (Signed)
EPIC Encounter for ICM Monitoring  Patient Name: [name] [name] is a 84 y.o. female Date: 06/21/2018 Primary Care Physican: Fayrene Helper, MD Primary Cardiologist:Croitoru Electrophysiologist:Taylor Dry Weight:186.1lbs Bi-V Pacing:99.4%    Clinical Status (13-May-2018 to 20-Jun-2018)  Treated VT/VF 0 episodes   AT/AF 1 episode   Time in AT/AF 24.0 hr/day (100.0%)  Observations (2) (13-May-2018 to 20-Jun-2018)  AT/AF >= 6 hr for 38 days.  Patient Activity less than 1 hr/day for 5 weeks.    Heart Failure questions reviewed, pt asymptomatic.   Thoracic impedance normal.  Prescribed: Furosemide40 mg 1 tablet daily. Metolazaone 2.5 mg Take 1tablet once a week (Wednesday). Potassium 20 mEq take 1 tablet daily.  Labs: 04/09/2018 Creatinine 1.91, BUN 53, Potassium 4.4, Sodium 135 03/30/2018 Creatinine 1.78, BUN 54, Potassium 3.9, Sodium 133, EGFR 25-29 12/11/2017 Creatinine1.89, BUN44, Potassium4.2, [id number], PJAS50-53 12/09/2017 Creatinine1.86, BUN51, Potassium4.1, ZJQBHA193, XTKW40-97  11/17/2017 Creatinine2.36, BUN79, Potassium4.5, [id number], [id number]  11/16/2017 Creatinine2.44, BUN68, Potassium4.1, Sodium136, IWLN98-92  11/15/2017 Creatinine2.34, BUN67, Potassium5.2, JJHERD408, XKGY18-56  11/07/2017 Creatinine2.17, BUN55, Potassium4.7, Sodium135, DJSH70-26  11/02/2017 Creatinine1.74, BUN55, Potassium4.2, VZCHYI502, [id number]  11/01/2017 Creatinine1.93, BUN54, Potassium3.9, MVEHMC947, SJGG83-66  10/31/2017 Creatinine2.03, BUN48, Potassium4.5, QHUTML465, KPTW65-68 A complete set of results can be found in Results Review.  Recommendations: No changes.   Encouraged to call for fluid symptoms.  Follow-up plan: ICM clinic phone appointment on 07/22/2018.   Office appointment scheduled 08/02/2018 with Dr. Sallyanne Kuster.    Copy of ICM check sent to Dr. Lovena Le and Dr Sallyanne Kuster.   3 month ICM trend:  06/20/2018    AT/AF   1 Year ICM trend:       [name] Billings, RN 06/21/2018 12:47 PM

## 2018-06-21 NOTE — Patient Instructions (Signed)
Decrease coumadin to 2 tablets daily except 1 tablet on Tuesdays and Fridays.  Recheck in 3 weeks.

## 2018-06-21 NOTE — Progress Notes (Signed)
Persistent atrial fibrillation for the last 2-3 months, which she has done before and then come back to normal rhythm.  I cannot see any connection between the arrhythmia and the fluid gain.  No change in treatment. MCr

## 2018-06-26 ENCOUNTER — Ambulatory Visit (INDEPENDENT_AMBULATORY_CARE_PROVIDER_SITE_OTHER): Payer: Medicare Other | Admitting: *Deleted

## 2018-06-26 DIAGNOSIS — I5042 Chronic combined systolic (congestive) and diastolic (congestive) heart failure: Secondary | ICD-10-CM | POA: Diagnosis not present

## 2018-06-26 DIAGNOSIS — I442 Atrioventricular block, complete: Secondary | ICD-10-CM

## 2018-06-26 DIAGNOSIS — I10 Essential (primary) hypertension: Secondary | ICD-10-CM | POA: Diagnosis not present

## 2018-06-26 DIAGNOSIS — R809 Proteinuria, unspecified: Secondary | ICD-10-CM | POA: Diagnosis not present

## 2018-06-26 DIAGNOSIS — E559 Vitamin D deficiency, unspecified: Secondary | ICD-10-CM | POA: Diagnosis not present

## 2018-06-26 DIAGNOSIS — D509 Iron deficiency anemia, unspecified: Secondary | ICD-10-CM | POA: Diagnosis not present

## 2018-06-26 DIAGNOSIS — Z79899 Other long term (current) drug therapy: Secondary | ICD-10-CM | POA: Diagnosis not present

## 2018-06-26 DIAGNOSIS — N183 Chronic kidney disease, stage 3 (moderate): Secondary | ICD-10-CM | POA: Diagnosis not present

## 2018-06-26 NOTE — Progress Notes (Signed)
Remote ICD transmission.   

## 2018-07-02 ENCOUNTER — Telehealth: Payer: Self-pay | Admitting: Family Medicine

## 2018-07-02 ENCOUNTER — Other Ambulatory Visit: Payer: Self-pay | Admitting: Family Medicine

## 2018-07-02 DIAGNOSIS — R7303 Prediabetes: Secondary | ICD-10-CM

## 2018-07-02 DIAGNOSIS — I10 Essential (primary) hypertension: Secondary | ICD-10-CM

## 2018-07-02 DIAGNOSIS — E785 Hyperlipidemia, unspecified: Secondary | ICD-10-CM

## 2018-07-02 LAB — CUP PACEART REMOTE DEVICE CHECK
Battery Remaining Longevity: 6 mo
Battery Voltage: 2.82 V
Brady Statistic AP VP Percent: 0 %
Brady Statistic AP VS Percent: 0 %
Brady Statistic AS VP Percent: 99.57 %
Brady Statistic AS VS Percent: 0.42 %
Brady Statistic RA Percent Paced: 0 %
Brady Statistic RV Percent Paced: 99.57 %
Date Time Interrogation Session: 20191002073424
HighPow Impedance: 36 Ohm
HighPow Impedance: 43 Ohm
Implantable Lead Implant Date: 20040521
Implantable Lead Implant Date: 20071218
Implantable Lead Implant Date: 20160516
Implantable Lead Location: 753858
Implantable Lead Location: 753859
Implantable Lead Location: 753860
Implantable Lead Model: 5076
Implantable Lead Model: 6947
Implantable Pulse Generator Implant Date: 20160516
Lead Channel Impedance Value: 285 Ohm
Lead Channel Impedance Value: 304 Ohm
Lead Channel Impedance Value: 342 Ohm
Lead Channel Impedance Value: 361 Ohm
Lead Channel Impedance Value: 361 Ohm
Lead Channel Impedance Value: 399 Ohm
Lead Channel Impedance Value: 456 Ohm
Lead Channel Impedance Value: 551 Ohm
Lead Channel Impedance Value: 589 Ohm
Lead Channel Impedance Value: 646 Ohm
Lead Channel Impedance Value: 703 Ohm
Lead Channel Impedance Value: 722 Ohm
Lead Channel Impedance Value: 722 Ohm
Lead Channel Pacing Threshold Amplitude: 0.5 V
Lead Channel Pacing Threshold Amplitude: 1.625 V
Lead Channel Pacing Threshold Amplitude: 5 V
Lead Channel Pacing Threshold Pulse Width: 0.4 ms
Lead Channel Pacing Threshold Pulse Width: 0.4 ms
Lead Channel Pacing Threshold Pulse Width: 1.5 ms
Lead Channel Sensing Intrinsic Amplitude: 10.5 mV
Lead Channel Sensing Intrinsic Amplitude: 10.5 mV
Lead Channel Sensing Intrinsic Amplitude: 2 mV
Lead Channel Sensing Intrinsic Amplitude: 2 mV
Lead Channel Setting Pacing Amplitude: 2 V
Lead Channel Setting Pacing Amplitude: 2.5 V
Lead Channel Setting Pacing Amplitude: 3 V
Lead Channel Setting Pacing Pulse Width: 0.6 ms
Lead Channel Setting Pacing Pulse Width: 1 ms
Lead Channel Setting Sensing Sensitivity: 0.3 mV

## 2018-07-02 NOTE — Telephone Encounter (Signed)
Patient left voicemail for you asking if she needed bloodwork for her upcoming appt.

## 2018-07-03 DIAGNOSIS — R809 Proteinuria, unspecified: Secondary | ICD-10-CM | POA: Diagnosis not present

## 2018-07-03 DIAGNOSIS — N183 Chronic kidney disease, stage 3 (moderate): Secondary | ICD-10-CM | POA: Diagnosis not present

## 2018-07-03 DIAGNOSIS — D638 Anemia in other chronic diseases classified elsewhere: Secondary | ICD-10-CM | POA: Diagnosis not present

## 2018-07-03 DIAGNOSIS — E559 Vitamin D deficiency, unspecified: Secondary | ICD-10-CM | POA: Diagnosis not present

## 2018-07-03 DIAGNOSIS — N2581 Secondary hyperparathyroidism of renal origin: Secondary | ICD-10-CM | POA: Diagnosis not present

## 2018-07-03 NOTE — Telephone Encounter (Signed)
Aware that labs are done per dusty

## 2018-07-09 DIAGNOSIS — E785 Hyperlipidemia, unspecified: Secondary | ICD-10-CM | POA: Diagnosis not present

## 2018-07-09 DIAGNOSIS — I10 Essential (primary) hypertension: Secondary | ICD-10-CM | POA: Diagnosis not present

## 2018-07-09 DIAGNOSIS — R7303 Prediabetes: Secondary | ICD-10-CM | POA: Diagnosis not present

## 2018-07-09 IMAGING — DX DG CHEST 2V
2 series · 2 of 2 positions shown · non-contrast
Comparison: 06/20/2017, 11/16/2016 CT

CLINICAL DATA: Chest palpitations and dyspnea x3 days.

EXAM:
CHEST  2 VIEW

[chest pa]
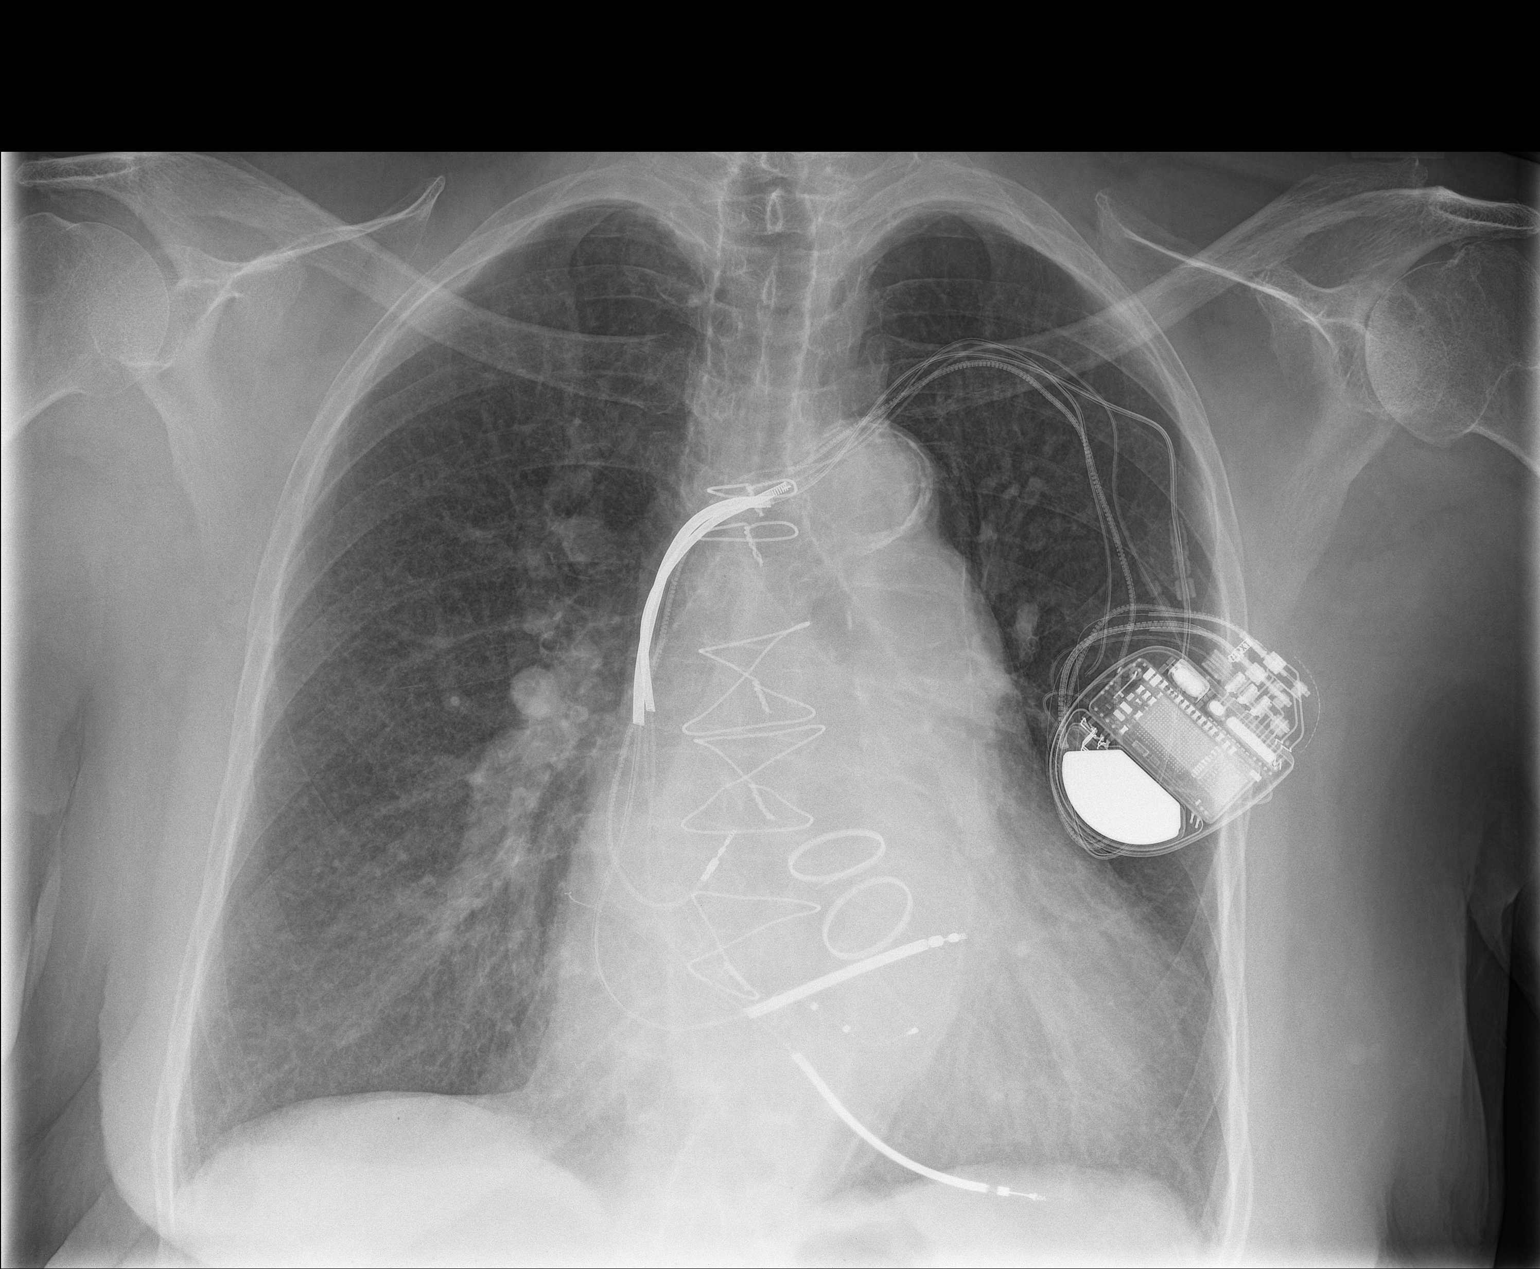

[chest lat]
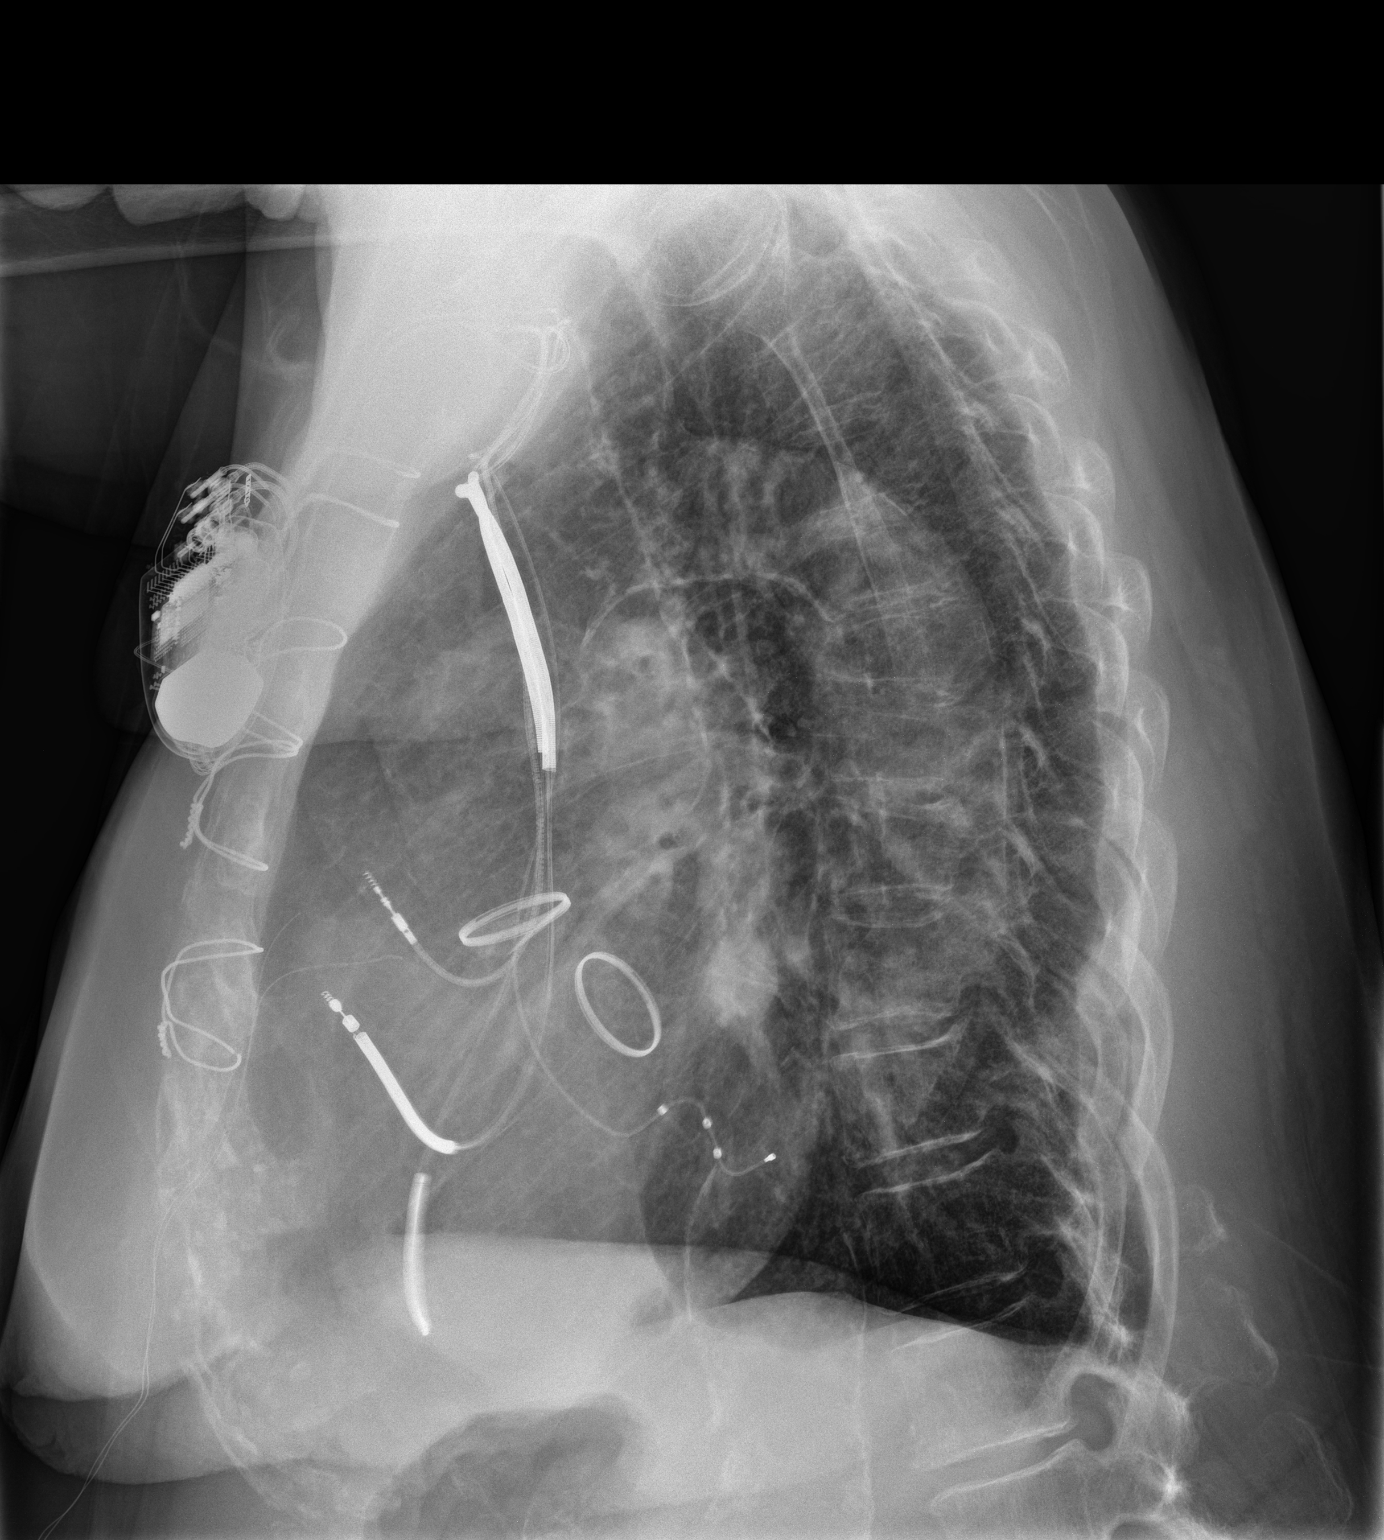

[2 of 2 positions shown; findings below may reference images not displayed]

FINDINGS: Stable cardiomegaly. Stable mild central vascular congestion.
Moderate aortic atherosclerosis at the arch. Left-sided ICD device
with leads in the coronary sinus, right atrium and right ventricle.
Status post median sternotomy and cardiac valvular replacement. No
alveolar consolidations. Fine reticular markings are again noted
bilaterally new, possibly representing chronic mild bronchiolitis
decreased on prior CT. No effusion or pneumothorax.
IMPRESSION: Stable cardiomegaly with aortic atherosclerosis and central vascular
congestion. Stable fine diffuse reticular markings within both
lungs, nonspecific but based on prior CT may represent areas of mild
bronchiolitis.

## 2018-07-10 LAB — COMPLETE METABOLIC PANEL WITH GFR
AG Ratio: 1.3 (calc) (ref 1.0–2.5)
ALT: 11 U/L (ref 6–29)
AST: 22 U/L (ref 10–35)
Albumin: 4.1 g/dL (ref 3.6–5.1)
Alkaline phosphatase (APISO): 58 U/L (ref 33–130)
BUN/Creatinine Ratio: 30 (calc) — ABNORMAL HIGH (ref 6–22)
BUN: 48 mg/dL — ABNORMAL HIGH (ref 7–25)
CO2: 32 mmol/L (ref 20–32)
Calcium: 9.6 mg/dL (ref 8.6–10.4)
Chloride: 96 mmol/L — ABNORMAL LOW (ref 98–110)
Creat: 1.6 mg/dL — ABNORMAL HIGH (ref 0.60–0.88)
GFR, Est African American: 34 mL/min/{1.73_m2} — ABNORMAL LOW (ref >=60)
GFR, Est Non African American: 29 mL/min/{1.73_m2} — ABNORMAL LOW (ref >=60)
Globulin: 3.2 g/dL (calc) (ref 1.9–3.7)
Glucose, Bld: 102 mg/dL — ABNORMAL HIGH (ref 65–99)
Potassium: 4.9 mmol/L (ref 3.5–5.3)
Sodium: 136 mmol/L (ref 135–146)
Total Bilirubin: 0.5 mg/dL (ref 0.2–1.2)
Total Protein: 7.3 g/dL (ref 6.1–8.1)

## 2018-07-10 LAB — HEMOGLOBIN A1C
Hgb A1c MFr Bld: 5.9 % of total Hgb — ABNORMAL HIGH (ref <5.7)
Mean Plasma Glucose: 123 (calc)
eAG (mmol/L): 6.8 (calc)

## 2018-07-10 LAB — CBC
HCT: 31.7 % — ABNORMAL LOW (ref 35.0–45.0)
Hemoglobin: 10.5 g/dL — ABNORMAL LOW (ref 11.7–15.5)
MCH: 29.6 pg (ref 27.0–33.0)
MCHC: 33.1 g/dL (ref 32.0–36.0)
MCV: 89.3 fL (ref 80.0–100.0)
MPV: 11.5 fL (ref 7.5–12.5)
Platelets: 169 10*3/uL (ref 140–400)
RBC: 3.55 10*6/uL — ABNORMAL LOW (ref 3.80–5.10)
RDW: 15.1 % — ABNORMAL HIGH (ref 11.0–15.0)
WBC: 4.6 10*3/uL (ref 3.8–10.8)

## 2018-07-10 LAB — LIPID PANEL
Cholesterol: 170 mg/dL (ref <200)
HDL: 33 mg/dL — ABNORMAL LOW (ref >50)
LDL Cholesterol (Calc): 103 mg/dL (calc) — ABNORMAL HIGH
Non-HDL Cholesterol (Calc): 137 mg/dL (calc) — ABNORMAL HIGH (ref <130)
Total CHOL/HDL Ratio: 5.2 (calc) — ABNORMAL HIGH (ref <5.0)
Triglycerides: 220 mg/dL — ABNORMAL HIGH (ref <150)

## 2018-07-15 ENCOUNTER — Ambulatory Visit (INDEPENDENT_AMBULATORY_CARE_PROVIDER_SITE_OTHER): Payer: Medicare Other | Admitting: *Deleted

## 2018-07-15 DIAGNOSIS — Z5181 Encounter for therapeutic drug level monitoring: Secondary | ICD-10-CM | POA: Diagnosis not present

## 2018-07-15 DIAGNOSIS — Z952 Presence of prosthetic heart valve: Secondary | ICD-10-CM | POA: Diagnosis not present

## 2018-07-15 LAB — POCT INR: INR: 2.7 (ref 2.0–3.0)

## 2018-07-15 NOTE — Patient Instructions (Signed)
Continue coumadin 2 tablets daily except 1 tablet on Tuesdays and Fridays.  Increase greens a little  Recheck in 4 weeks.

## 2018-07-16 ENCOUNTER — Other Ambulatory Visit (HOSPITAL_COMMUNITY): Payer: Self-pay

## 2018-07-16 DIAGNOSIS — D649 Anemia, unspecified: Secondary | ICD-10-CM

## 2018-07-16 DIAGNOSIS — N184 Chronic kidney disease, stage 4 (severe): Secondary | ICD-10-CM

## 2018-07-17 ENCOUNTER — Ambulatory Visit (INDEPENDENT_AMBULATORY_CARE_PROVIDER_SITE_OTHER): Payer: Medicare Other | Admitting: Family Medicine

## 2018-07-17 ENCOUNTER — Encounter: Payer: Self-pay | Admitting: Family Medicine

## 2018-07-17 VITALS — BP 110/56 | HR 76 | Resp 12 | Ht 67.0 in | Wt 201.0 lb

## 2018-07-17 DIAGNOSIS — Z23 Encounter for immunization: Secondary | ICD-10-CM | POA: Diagnosis not present

## 2018-07-17 DIAGNOSIS — I5032 Chronic diastolic (congestive) heart failure: Secondary | ICD-10-CM | POA: Diagnosis not present

## 2018-07-17 DIAGNOSIS — R7303 Prediabetes: Secondary | ICD-10-CM

## 2018-07-17 DIAGNOSIS — E890 Postprocedural hypothyroidism: Secondary | ICD-10-CM

## 2018-07-17 DIAGNOSIS — R7301 Impaired fasting glucose: Secondary | ICD-10-CM

## 2018-07-17 DIAGNOSIS — G8929 Other chronic pain: Secondary | ICD-10-CM | POA: Diagnosis not present

## 2018-07-17 DIAGNOSIS — E785 Hyperlipidemia, unspecified: Secondary | ICD-10-CM

## 2018-07-17 DIAGNOSIS — Z9981 Dependence on supplemental oxygen: Secondary | ICD-10-CM | POA: Diagnosis not present

## 2018-07-17 DIAGNOSIS — E790 Hyperuricemia without signs of inflammatory arthritis and tophaceous disease: Secondary | ICD-10-CM | POA: Diagnosis not present

## 2018-07-17 DIAGNOSIS — E669 Obesity, unspecified: Secondary | ICD-10-CM

## 2018-07-17 DIAGNOSIS — I10 Essential (primary) hypertension: Secondary | ICD-10-CM | POA: Diagnosis not present

## 2018-07-17 DIAGNOSIS — M25551 Pain in right hip: Secondary | ICD-10-CM

## 2018-07-17 DIAGNOSIS — I255 Ischemic cardiomyopathy: Secondary | ICD-10-CM

## 2018-07-17 MED ORDER — ALBUTEROL SULFATE HFA 108 (90 BASE) MCG/ACT IN AERS: INHALATION_SPRAY | RESPIRATORY_TRACT | 2 refills | Status: DC

## 2018-07-17 NOTE — Patient Instructions (Addendum)
F/u in 4.5 month, call if you need me sooner  Flu vaccine today  Labs are good, except triglycerides are high, you need to reduce cheese, egg yolk , fried and fatty foods  Fasting lipid, cmp and eGFr, HBA1c, Uric acid and TSH 1 week before next visit  Careful not to fall.  Check your insurance to see if in home assistance can be covered  Thank you  for choosing [facility]. We consider it a privelige to serve you.  Delivering excellent health care in a caring and  compassionate way is our goal.  Partnering with you,  so that together we can achieve this goal is our strategy.

## 2018-07-18 ENCOUNTER — Encounter (HOSPITAL_COMMUNITY): Payer: Self-pay

## 2018-07-18 ENCOUNTER — Inpatient Hospital Stay (HOSPITAL_COMMUNITY): Payer: Medicare Other | Attending: Hematology

## 2018-07-18 ENCOUNTER — Other Ambulatory Visit: Payer: Self-pay

## 2018-07-18 VITALS — BP 114/47 | HR 69 | Temp 99.0°F | Resp 18

## 2018-07-18 DIAGNOSIS — D631 Anemia in chronic kidney disease: Secondary | ICD-10-CM | POA: Insufficient documentation

## 2018-07-18 DIAGNOSIS — D649 Anemia, unspecified: Secondary | ICD-10-CM

## 2018-07-18 DIAGNOSIS — N184 Chronic kidney disease, stage 4 (severe): Secondary | ICD-10-CM

## 2018-07-18 LAB — CBC
HCT: 30.8 % — ABNORMAL LOW (ref 36.0–46.0)
Hemoglobin: 9.4 g/dL — ABNORMAL LOW (ref 12.0–15.0)
MCH: 28.8 pg (ref 26.0–34.0)
MCHC: 30.5 g/dL (ref 30.0–36.0)
MCV: 94.5 fL (ref 80.0–100.0)
Platelets: 155 10*3/uL (ref 150–400)
RBC: 3.26 MIL/uL — ABNORMAL LOW (ref 3.87–5.11)
RDW: 15.9 % — ABNORMAL HIGH (ref 11.5–15.5)
WBC: 6.1 10*3/uL (ref 4.0–10.5)
nRBC: 0 % (ref 0.0–0.2)

## 2018-07-18 MED ORDER — EPOETIN ALFA 40000 UNIT/ML IJ SOLN
40000.0000 [IU] | Freq: Once | INTRAMUSCULAR | Status: AC
  Administered 2018-07-18: 40000 [IU] via SUBCUTANEOUS

## 2018-07-18 MED ORDER — EPOETIN ALFA 40000 UNIT/ML IJ SOLN
INTRAMUSCULAR | Status: AC
  Filled 2018-07-18: qty 1

## 2018-07-18 NOTE — Progress Notes (Signed)
[name] [name] presents today for injection per MD orders. Procrit 40,000 units administered SQ in right Abdomen. Administration without incident. Patient tolerated well.

## 2018-07-21 ENCOUNTER — Encounter: Payer: Self-pay | Admitting: Family Medicine

## 2018-07-21 NOTE — Assessment & Plan Note (Signed)
Controlled, no change in medication DASH diet and commitment to daily physical activity for a minimum of 30 minutes discussed and encouraged, as a part of hypertension management. The importance of attaining a healthy weight is also discussed.  BP/Weight 07/18/2018 07/17/2018 06/06/2018 05/13/2018 04/25/2018 04/24/2018 04/15/7736  Systolic BP 505 107 125 247 998 001 239  Diastolic BP 47 56 59 58 58 69 60  Wt. (Lbs) - 201 - 201 - 205 204.08  BMI - 31.48 - 31.48 - 33.09 31.96

## 2018-07-21 NOTE — Assessment & Plan Note (Signed)
Stable , no decompensation currently 

## 2018-07-21 NOTE — Assessment & Plan Note (Signed)
Lifelong need t due to COPD and heart failure

## 2018-07-21 NOTE — Assessment & Plan Note (Signed)
Orthopedics manages with intra articular injections periodically as well as pain mangement

## 2018-07-21 NOTE — Progress Notes (Signed)
Danial Sisley     MRN: [id number]      DOB: [birth year]   HPI [name] is here for follow up and re-evaluation of chronic medical conditions, medication management and review of any available recent lab and radiology data.  Preventive health is updated, specifically  Cancer screening and Immunization.    The PT denies any adverse reactions to current medications since the last visit.  There are no new concerns.  There are no specific complaints   ROS Denies recent fever or chills. Denies sinus pressure, nasal congestion, ear pain or sore throat. Denies chest congestion, productive cough or wheezing. Denies chest pains, palpitations and leg swelling Denies abdominal pain, nausea, vomiting,diarrhea or constipation.   Denies dysuria, frequency, hesitancy or incontinence. Chronic  joint pain,  and limitation in mobility.Managed by ortho Denies headaches, seizures,c/o lower extremity  numbness, and tingling. Denies depression, anxiety or insomnia. Denies skin break down or rash.   PE  BP (!) 110/56 (BP Location: Right Arm, Patient Position: Sitting, Cuff Size: Large)   Pulse 76   Resp 12   Ht 5\' 7"  (1.702 m)   Wt 201 lb (91.2 kg)   SpO2 94% Comment: with 1 lpm supplemental oxygen  BMI 31.48 kg/m   Patient alert and oriented and in no cardiopulmonary distress.  HEENT: No facial asymmetry, EOMI,   oropharynx pink and moist.  Neck decreased ROM no JVD, no mass.  Chest: Clear to auscultation bilaterally.  CVS: S1, S2 systolic murmur, no S3.Regular rate.Click  ABD: Soft non tender.   Ext: No edema  MS: Markedly decreased  ROM spine, , hips and knees.  Skin: Intact, no ulcerations or rash noted.  Psych: Good eye contact, normal affect. Memory intact not anxious or depressed appearing.  CNS: CN 2-12 intact, power,  normal throughout.no focal deficits noted.   Assessment & Plan  Essential hypertension Controlled, no change in medication DASH diet and commitment to  daily physical activity for a minimum of 30 minutes discussed and encouraged, as a part of hypertension management. The importance of attaining a healthy weight is also discussed.  BP/Weight 07/18/2018 07/17/2018 06/06/2018 05/13/2018 04/25/2018 04/24/2018 12/19/7122  Systolic BP 580 998 338 250 539 767 341  Diastolic BP 47 56 59 58 58 69 60  Wt. (Lbs) - 201 - 201 - 205 204.08  BMI - 31.48 - 31.48 - 33.09 31.96       Chronic diastolic heart failure (HCC) Stable , no decompensation currently  Chronic right hip pain Orthopedics manages with intra articular injections periodically as well as pain mangement  Dependence on nocturnal oxygen therapy Lifelong need t due to COPD and heart failure  Mild obesity Slight improvement which is good Patient re-educated about  the importance of commitment to a  minimum of 150 minutes of exercise per week.  The importance of healthy food choices with portion control discussed. Encouraged to start a food diary, count calories and to consider  joining a support group. Sample diet sheets offered. Goals set by the patient for the next several months.   Weight /BMI 07/17/2018 05/13/2018 04/24/2018  WEIGHT 201 lb 201 lb 205 lb  HEIGHT 5\' 7"  5\' 7"  5\' 6"   BMI 31.48 kg/m2 31.48 kg/m2 33.09 kg/m2      Prediabetes Patient educated about the importance of limiting  Carbohydrate intake , the need to commit to daily physical activity for a minimum of 30 minutes , and to commit weight loss. The fact that changes in all  these areas will reduce or eliminate all together the development of diabetes is stressed.   Diabetic Labs Latest Ref Rng & Units 07/09/2018 04/09/2018 03/30/2018 12/11/2017 12/09/2017  HbA1c <5.7 % of total Hgb 5.9(H) 5.8(H) - - -  Microalbumin mg/dL - - - - -  Micro/Creat Ratio <30 mcg/mg creat - - - - -  Chol <200 mg/dL 170 159 - - -  HDL >50 mg/dL 33(L) 32(L) - - -  Calc LDL mg/dL (calc) 103(H) 95 - - -  Triglycerides <150 mg/dL 220(H) 230(H) -  - -  Creatinine 0.60 - 0.88 mg/dL 1.60(H) 1.91(H) 1.78(H) 1.89(H) 1.86(H)   BP/Weight 07/18/2018 07/17/2018 06/06/2018 05/13/2018 04/25/2018 04/24/2018 0/92/9574  Systolic BP 734 037 096 438 381 840 375  Diastolic BP 47 56 59 58 58 69 60  Wt. (Lbs) - 201 - 201 - 205 204.08  BMI - 31.48 - 31.48 - 33.09 31.96   Foot/eye exam completion dates Latest Ref Rng & Units 07/17/2018 06/28/2017  Eye Exam No Retinopathy - -  Foot Form Completion - Done Done      Hyperlipidemia LDL goal <70 Hyperlipidemia:Low fat diet discussed and encouraged.   Lipid Panel  Lab Results  Component Value Date   CHOL 170 07/09/2018   HDL 33 (L) 07/09/2018   LDLCALC 103 (H) 07/09/2018   TRIG 220 (H) 07/09/2018   CHOLHDL 5.2 (H) 07/09/2018  elevates TG and low HDL, needs to reduce fat intake and increase activity

## 2018-07-21 NOTE — Assessment & Plan Note (Signed)
Patient educated about the importance of limiting  Carbohydrate intake , the need to commit to daily physical activity for a minimum of 30 minutes , and to commit weight loss. The fact that changes in all these areas will reduce or eliminate all together the development of diabetes is stressed.   Diabetic Labs Latest Ref Rng & Units 07/09/2018 04/09/2018 03/30/2018 12/11/2017 12/09/2017  HbA1c <5.7 % of total Hgb 5.9(H) 5.8(H) - - -  Microalbumin mg/dL - - - - -  Micro/Creat Ratio <30 mcg/mg creat - - - - -  Chol <200 mg/dL 170 159 - - -  HDL >50 mg/dL 33(L) 32(L) - - -  Calc LDL mg/dL (calc) 103(H) 95 - - -  Triglycerides <150 mg/dL 220(H) 230(H) - - -  Creatinine 0.60 - 0.88 mg/dL 1.60(H) 1.91(H) 1.78(H) 1.89(H) 1.86(H)   BP/Weight 07/18/2018 07/17/2018 06/06/2018 05/13/2018 04/25/2018 04/24/2018 2/48/1859  Systolic BP 093 112 162 446 950 722 575  Diastolic BP 47 56 59 58 58 69 60  Wt. (Lbs) - 201 - 201 - 205 204.08  BMI - 31.48 - 31.48 - 33.09 31.96   Foot/eye exam completion dates Latest Ref Rng & Units 07/17/2018 06/28/2017  Eye Exam No Retinopathy - -  Foot Form Completion - Done Done

## 2018-07-21 NOTE — Assessment & Plan Note (Addendum)
Slight improvement which is good Patient re-educated about  the importance of commitment to a  minimum of 150 minutes of exercise per week.  The importance of healthy food choices with portion control discussed. Encouraged to start a food diary, count calories and to consider  joining a support group. Sample diet sheets offered. Goals set by the patient for the next several months.   Weight /BMI 07/17/2018 05/13/2018 04/24/2018  WEIGHT 201 lb 201 lb 205 lb  HEIGHT 5\' 7"  5\' 7"  5\' 6"   BMI 31.48 kg/m2 31.48 kg/m2 33.09 kg/m2

## 2018-07-21 NOTE — Assessment & Plan Note (Signed)
Hyperlipidemia:Low fat diet discussed and encouraged.   Lipid Panel  Lab Results  Component Value Date   CHOL 170 07/09/2018   HDL 33 (L) 07/09/2018   LDLCALC 103 (H) 07/09/2018   TRIG 220 (H) 07/09/2018   CHOLHDL 5.2 (H) 07/09/2018  elevates TG and low HDL, needs to reduce fat intake and increase activity

## 2018-07-22 ENCOUNTER — Ambulatory Visit (INDEPENDENT_AMBULATORY_CARE_PROVIDER_SITE_OTHER): Payer: Medicare Other

## 2018-07-22 ENCOUNTER — Telehealth: Payer: Self-pay

## 2018-07-22 DIAGNOSIS — I5042 Chronic combined systolic (congestive) and diastolic (congestive) heart failure: Secondary | ICD-10-CM | POA: Diagnosis not present

## 2018-07-22 DIAGNOSIS — Z95810 Presence of automatic (implantable) cardiac defibrillator: Secondary | ICD-10-CM | POA: Diagnosis not present

## 2018-07-22 NOTE — Progress Notes (Signed)
EPIC Encounter for ICM Monitoring  Patient Name: [name] [name] is a 84 y.o. female Date: 07/22/2018 Primary Care Physican: Fayrene Helper, MD Primary Cardiologist:Croitoru Electrophysiologist:Taylor Dry Weight:Previous weight 186.1lbs Bi-V Pacing:99.4%    Clinical Status (26-Jun-2018 to 22-Jul-2018)  Treated VT/VF 0 episodes   AT/AF 1 episode   Time in AT/AF 24.0 hr/day (100.0%)  Observations (2) (26-Jun-2018 to 22-Jul-2018)  AT/AF >= 6 hr for 26 days.  Patient Activity less than 1 hr/day for 3 weeks.       Attempted call to patient and unable to reach.    Transmission reviewed.    Thoracic impedance abnormal suggesting fluid accumulation starting 07/06/2018.   Prescribed: Furosemide40 mg 1 tablet daily. Metolazaone 2.5 mg Take 1tablet once a week (Wednesday). Potassium 20 mEq take 1 tablet daily.  Labs: 04/09/2018 Creatinine 1.91, BUN 53, Potassium 4.4, Sodium 135 03/30/2018 Creatinine 1.78, BUN 54, Potassium 3.9, Sodium 133, EGFR 25-29 12/11/2017 Creatinine1.89, BUN44, Potassium4.2, [id number], [id number] 12/09/2017 Creatinine1.86, BUN51, Potassium4.1, [id number], SMOL07-86  11/17/2017 Creatinine2.36, BUN79, Potassium4.5, LJQGBE010, OFHQ19-75  11/16/2017 Creatinine2.44, BUN68, Potassium4.1, Sodium136, OITG54-98  11/15/2017 Creatinine2.34, BUN67, Potassium5.2, [id number], [id number]  11/07/2017 Creatinine2.17, BUN55, Potassium4.7, Sodium135, PJSR15-94  11/02/2017 Creatinine1.74, BUN55, Potassium4.2, VOPFYT244, QKMM38-17  11/01/2017 Creatinine1.93, BUN54, Potassium3.9, RNHAFB903, YBFX83-29  10/31/2017 Creatinine2.03, BUN48, Potassium4.5, VBTYOM600, KHTX77-41 A complete set of results can be found in Results Review.  Recommendations:  Unable to reach.  Follow-up plan: ICM clinic phone appointment on 08/08/2018 to recheck fluid levels.   Office appointment scheduled 08/02/2018 with Dr. Sallyanne Kuster.    Copy of ICM  check sent to Dr. Lovena Le and Dr Sallyanne Kuster.   3 month ICM trend: 07/22/2018    AT/AF   1 Year ICM trend:       [name], RN 07/22/2018 11:18 AM

## 2018-07-22 NOTE — Telephone Encounter (Signed)
Remote ICM transmission received.  Attempted call to patient regarding ICM remote transmission and line busy x 2. 

## 2018-07-23 NOTE — Progress Notes (Signed)
Thank you, will try to reach as well. MCr

## 2018-07-30 ENCOUNTER — Emergency Department (HOSPITAL_COMMUNITY): Payer: Medicare Other

## 2018-07-30 ENCOUNTER — Emergency Department (HOSPITAL_COMMUNITY)
Admission: EM | Admit: 2018-07-30 | Discharge: 2018-07-30 | Disposition: A | Discharge status: Home / Self Care | Payer: Medicare Other | Attending: Emergency Medicine | Admitting: Emergency Medicine

## 2018-07-30 ENCOUNTER — Encounter (HOSPITAL_COMMUNITY): Payer: Self-pay | Admitting: Emergency Medicine

## 2018-07-30 ENCOUNTER — Other Ambulatory Visit: Payer: Self-pay

## 2018-07-30 DIAGNOSIS — N184 Chronic kidney disease, stage 4 (severe): Secondary | ICD-10-CM | POA: Insufficient documentation

## 2018-07-30 DIAGNOSIS — I110 Hypertensive heart disease with heart failure: Secondary | ICD-10-CM | POA: Diagnosis not present

## 2018-07-30 DIAGNOSIS — J449 Chronic obstructive pulmonary disease, unspecified: Secondary | ICD-10-CM | POA: Diagnosis not present

## 2018-07-30 DIAGNOSIS — E785 Hyperlipidemia, unspecified: Secondary | ICD-10-CM | POA: Insufficient documentation

## 2018-07-30 DIAGNOSIS — R0602 Shortness of breath: Secondary | ICD-10-CM | POA: Diagnosis not present

## 2018-07-30 DIAGNOSIS — I509 Heart failure, unspecified: Secondary | ICD-10-CM

## 2018-07-30 DIAGNOSIS — I130 Hypertensive heart and chronic kidney disease with heart failure and stage 1 through stage 4 chronic kidney disease, or unspecified chronic kidney disease: Secondary | ICD-10-CM | POA: Diagnosis not present

## 2018-07-30 DIAGNOSIS — Z7901 Long term (current) use of anticoagulants: Secondary | ICD-10-CM | POA: Diagnosis not present

## 2018-07-30 DIAGNOSIS — Z87891 Personal history of nicotine dependence: Secondary | ICD-10-CM | POA: Insufficient documentation

## 2018-07-30 DIAGNOSIS — Z79899 Other long term (current) drug therapy: Secondary | ICD-10-CM | POA: Diagnosis not present

## 2018-07-30 DIAGNOSIS — I5033 Acute on chronic diastolic (congestive) heart failure: Secondary | ICD-10-CM | POA: Diagnosis not present

## 2018-07-30 LAB — CBC WITH DIFFERENTIAL/PLATELET
Abs Immature Granulocytes: 0.06 10*3/uL (ref 0.00–0.07)
Basophils Absolute: 0 10*3/uL (ref 0.0–0.1)
Basophils Relative: 0 %
Eosinophils Absolute: 0 10*3/uL (ref 0.0–0.5)
Eosinophils Relative: 0 %
HCT: 31.7 % — ABNORMAL LOW (ref 36.0–46.0)
Hemoglobin: 9.8 g/dL — ABNORMAL LOW (ref 12.0–15.0)
Immature Granulocytes: 1 %
Lymphocytes Relative: 11 %
Lymphs Abs: 0.7 10*3/uL (ref 0.7–4.0)
MCH: 29.4 pg (ref 26.0–34.0)
MCHC: 30.9 g/dL (ref 30.0–36.0)
MCV: 95.2 fL (ref 80.0–100.0)
Monocytes Absolute: 0.5 10*3/uL (ref 0.1–1.0)
Monocytes Relative: 9 %
Neutro Abs: 4.9 10*3/uL (ref 1.7–7.7)
Neutrophils Relative %: 79 %
Platelets: 201 10*3/uL (ref 150–400)
RBC: 3.33 MIL/uL — ABNORMAL LOW (ref 3.87–5.11)
RDW: 16.6 % — ABNORMAL HIGH (ref 11.5–15.5)
WBC: 6.2 10*3/uL (ref 4.0–10.5)
nRBC: 0 % (ref 0.0–0.2)

## 2018-07-30 LAB — TROPONIN I
Troponin I: 0.04 ng/mL (ref <0.03)
Troponin I: 0.04 ng/mL (ref <0.03)

## 2018-07-30 LAB — BRAIN NATRIURETIC PEPTIDE: B Natriuretic Peptide: 320 pg/mL — ABNORMAL HIGH (ref 0.0–100.0)

## 2018-07-30 LAB — COMPREHENSIVE METABOLIC PANEL
ALT: 11 U/L (ref 0–44)
AST: 22 U/L (ref 15–41)
Albumin: 3.9 g/dL (ref 3.5–5.0)
Alkaline Phosphatase: 55 U/L (ref 38–126)
Anion gap: 9 (ref 5–15)
BUN: 49 mg/dL — ABNORMAL HIGH (ref 8–23)
CO2: 28 mmol/L (ref 22–32)
Calcium: 9.4 mg/dL (ref 8.9–10.3)
Chloride: 98 mmol/L (ref 98–111)
Creatinine, Ser: 1.58 mg/dL — ABNORMAL HIGH (ref 0.44–1.00)
GFR calc Af Amer: 34 mL/min — ABNORMAL LOW (ref >60)
GFR calc non Af Amer: 29 mL/min — ABNORMAL LOW (ref >60)
Glucose, Bld: 118 mg/dL — ABNORMAL HIGH (ref 70–99)
Potassium: 4 mmol/L (ref 3.5–5.1)
Sodium: 135 mmol/L (ref 135–145)
Total Bilirubin: 0.6 mg/dL (ref 0.3–1.2)
Total Protein: 7.7 g/dL (ref 6.5–8.1)

## 2018-07-30 MED ORDER — FUROSEMIDE 10 MG/ML IJ SOLN
80.0000 mg | Freq: Once | INTRAMUSCULAR | Status: AC
  Administered 2018-07-30: 80 mg via INTRAVENOUS
  Filled 2018-07-30: qty 8

## 2018-07-30 MED ORDER — NITROGLYCERIN 0.4 MG SL SUBL
0.4000 mg | SUBLINGUAL_TABLET | SUBLINGUAL | Status: DC | PRN
  Filled 2018-07-30: qty 1

## 2018-07-30 NOTE — Discharge Instructions (Signed)
Double your Furosemide for the next 3 days and follow up with your cardiologist or primary doctor. Return if chest pain returns or shortness of breath worsens.

## 2018-07-30 NOTE — ED Notes (Signed)
CRITICAL VALUE ALERT  Critical Value:  Troponin 0.04  Date & Time Notied:  07/30/2018, 1652  Provider Notified: Dr. Dayna Barker  Orders Received/Actions taken:  See chart

## 2018-07-30 NOTE — ED Notes (Signed)
Ambulated pt with pulse ox. Oxygen saturation 96-98%.

## 2018-07-30 NOTE — ED Notes (Signed)
Pt ambulated well with steady gate. O2 stayed around 95 while ambulating to and from restroom.

## 2018-07-30 NOTE — ED Provider Notes (Signed)
Emergency Department Provider Note   I have reviewed the triage vital signs and the nursing notes.   HISTORY  Chief Complaint Shortness of Breath   HPI [name] [name] is a 84 y.o. female who presents the emergency department today with chest pain shortness of breath.  Patient states she has a history of heart failure and feels like this is an exacerbation of the same.  She took an extra dose of her diuretic of few days ago and some shortness of breath improved but she still has some chest discomfort so she presented here for further evaluation.  Not made worse with position changes or with exertion.  Not made worse with lying flat.  She does have some lower extremity swelling which seems to have improved.  No fevers, cough or congestion.  No lightheadedness, diaphoresis. No other associated or modifying symptoms.    Past Medical History:  Diagnosis Date  . Adenomatous polyp of colon 12/16/2002   Dr. [name]/[facility]  . Allergy   . Cataract   . CHB (complete heart block) ([place]) 10/07/2014      . CHF (congestive heart failure) ([place])    a.  EF previously reduced to 25-30% b. EF improved to 60-65% by echo in 10/2017.   Marland Kitchen Chronic kidney disease, stage I 2006  . Constipation       . COPD (chronic obstructive pulmonary disease) ([place])       . DJD (degenerative joint disease) of lumbar spine   . DM (diabetes mellitus) ([place]) 2006  . Esophageal reflux   . Glaucoma   . Gout       . H/O heart valve replacement with mechanical valve    a. s/p mechanical AVR in 2001 and mechanical MVR in 2004.  Marland Kitchen H/O wisdom tooth extraction   . Hyperlipemia   . IDA (iron deficiency anemia)    Parenteral iron/[name]  . Insomnia       . Non-ischemic cardiomyopathy ([place])    a. s/p MDT CRTD  . Obesity, unspecified   . Other and unspecified hyperlipidemia   . Oxygen deficiency 2014   nocturnal;  . Spondylolisthesis   . Spondylosis   . Thyroid cancer ([place])      remote thyroidectomy, no recurrence, pt denies in 2016 thyroid cancer  . Unspecified hypothyroidism       . 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 Vertigo         Patient Active Problem List   Diagnosis Date Noted  . Chronic right hip pain 04/16/2018  . Trochanteric bursitis, right hip 03/07/2017  . Persistent atrial fibrillation   . Nocturnal hypoxia 10/08/2016  . Dependence on nocturnal oxygen therapy 10/08/2016  . S/P ICD (internal cardiac defibrillator) procedure 02/08/2015  . CHB (complete heart block) ([place]) 10/07/2014  . Fatigue 07/20/2014  . Anemia 06/24/2014  . Osteopenia 02/10/2014  . Encounter for therapeutic drug monitoring 10/29/2013  . Spinal stenosis of lumbar region 02/19/2013  . OA (osteoarthritis) of knee 02/19/2013  . Chronic pain of right knee 01/02/2013  . Hemolytic anemia ([place]) 09/24/2012  . H/O adenomatous polyp of colon 05/24/2012  . High risk medication use 05/24/2012  . COPD (chronic obstructive pulmonary disease) ([place]) 05/19/2012  . Chronic diastolic heart failure ([place]) 05/19/2012  . Chronic anticoagulation, on coumadin for Mechanical AVR and MVR 04/01/2012  . Cardiorenal syndrome with renal failure 03/22/2012  . S/P AVR (aortic valve replacement) 03/22/2012  . S/P MVR (  mitral valve replacement) 03/22/2012  . Biventricular automatic implantable cardioverter defibrillator in situ 03/22/2012  . CKD (chronic kidney disease), stage IV ([place]) 03/22/2012  . Warfarin-induced coagulopathy ([place]) 03/21/2012  . Papilloma of breast 03/06/2012  . Iron deficiency 10/04/2011  . Allergic rhinitis 02/14/2011  . Nausea without vomiting 06/07/2010  . HIP PAIN, RIGHT 04/23/2009  . VARICOSE VEINS LOWER EXTREMITIES W/OTH COMPS 02/09/2009  . Calculus of gallbladder with chronic cholecystitis without obstruction 02/09/2009  . Prediabetes 06/15/2008  . Hypothyroidism, postsurgical 02/24/2008  . Hyperlipidemia LDL goal <70 02/24/2008  . Mild  obesity 02/24/2008  . Essential hypertension 02/24/2008  . Non-ischemic cardiomyopathy with ICD 02/24/2008  . GERD 02/24/2008  . DEGENERATIVE JOINT DISEASE, SPINE 02/24/2008    Past Surgical History:  Procedure Laterality Date  . AORTIC AND MITRAL VALVE REPLACEMENT  2001  . BI-VENTRICULAR IMPLANTABLE CARDIOVERTER DEFIBRILLATOR UPGRADE N/A 01/18/2015   Procedure: BI-VENTRICULAR IMPLANTABLE CARDIOVERTER DEFIBRILLATOR UPGRADE;  Surgeon: Evans Lance, MD;  Location[facility];  Service: Cardiovascular;  Laterality: N/A;  . CARDIAC CATHETERIZATION    . CARDIAC VALVE REPLACEMENT    . CARDIOVERSION N/A 11/13/2016   Procedure: CARDIOVERSION;  Surgeon: Sanda Klein, MD;  Location: [facility] ENDOSCOPY;  Service: Cardiovascular;  Laterality: N/A;  . COLONOSCOPY  06/12/2007   Rourk- Normal rectum/Normal colon  . COLONOSCOPY  12/16/02   small hemorrhoids  . COLONOSCOPY  06/20/2012   Procedure: COLONOSCOPY;  Surgeon: Daneil Dolin, MD;  Location: [facility] ENDO SUITE;  Service: Endoscopy;  Laterality: N/A;  10:45  . defibrillator implanted 2006    . DOPPLER ECHOCARDIOGRAPHY  2012  . DOPPLER ECHOCARDIOGRAPHY  05,06,07,08,09,2011  . ESOPHAGOGASTRODUODENOSCOPY  06/12/2007   Rourk- normal esophagus, small hiatal hernia, otherwise normal stomach, D1, D2  . EYE SURGERY Right 2005  . EYE SURGERY Left 2006  . ICD LEAD REMOVAL Left 02/08/2015   Procedure: ICD LEAD REMOVAL;  Surgeon: Evans Lance, MD;  Location: [facility] OR;  Service: Cardiovascular;  Laterality: Left;  "Will plan extraction and insertion of a BiV PM"  **Dr. Roxan Hockey backing up case**  . IMPLANTABLE CARDIOVERTER DEFIBRILLATOR (ICD) GENERATOR CHANGE Left 02/08/2015   Procedure: ICD GENERATOR CHANGE;  Surgeon: Evans Lance, MD;  Location: [place];  Service: Cardiovascular;  Laterality: Left;  . INSERT / REPLACE / REMOVE PACEMAKER    . PACEMAKER INSERTION  May 2016  . pacemaker placed  2004  . right breast cyst removed     benign   . right cataract  removed     2005  . TEE WITHOUT CARDIOVERSION N/A 11/13/2016   Procedure: TRANSESOPHAGEAL ECHOCARDIOGRAM (TEE);  Surgeon: Sanda Klein, MD;  Location: [facility] ENDOSCOPY;  Service: Cardiovascular;  Laterality: N/A;  . THYROIDECTOMY      Current Outpatient Rx  . Order #: [id number] Class: Historical Med  . Order #: [id number] Class: Normal  . Order #: [id number] Class: Normal  . Order #: [id number] Class: Normal  . Order #: [id number] Class: Historical Med  . Order #: [id number] Class: Historical Med  . Order #: [id number] Class: Normal  . Order #: [id number] Class: Normal  . Order #: [id number] Class: Historical Med  . Order #: [id number] Class: Normal  . Order #: [id number] Class: Normal  . Order #: [id number] Class: Normal  . Order #: [id number] Class: Normal  . Order #: [id number] Class: Normal  . Order #: [id number] Class: Normal  . Order #: [id number] Class: Normal  . Order #: [id number] Class: Normal  . Order #: [id number] Class: Normal  . Order #: [id number] Class: Normal  . Order #: [id number] Class: Normal  . Order #:  [id number] Class: Historical Med  . Order #: [id number] Class: Normal  . Order #: [id number] Class: Historical Med  . Order #: [id number] Class: Normal  . Order #: [id number] Class: Normal  . Order #: [id number] Class: Normal    Allergies Penicillins; Aspirin; Fentanyl; and Niacin  Family History  Problem Relation Age of Onset  . Pancreatic cancer Mother   . Heart disease Father   . Heart disease Sister   . Heart attack Sister   . Heart disease Brother   . Diabetes Brother   . Heart disease Brother   . Diabetes Brother   . Hypertension Brother   . Lung cancer Brother     Social History Social History   Tobacco Use  . Smoking status: Former Smoker    Packs/day: 0.75    Years: 40.00    Pack years: 30.00    Types: Cigarettes    Last attempt to quit: 03/08/1987    Years since quitting: 31.4  . Smokeless tobacco: Never Used  Substance Use Topics  . Alcohol use: No    Alcohol/week: 0.0  standard drinks  . Drug use: No    Review of Systems  All other systems negative except as documented in the HPI. All pertinent positives and negatives as reviewed in the HPI. ____________________________________________   PHYSICAL EXAM:  VITAL SIGNS: ED Triage Vitals  Enc Vitals Group     BP 07/30/18 1209 (!) 150/61     Pulse Rate 07/30/18 1209 71     Resp 07/30/18 1209 (!) 26     Temp 07/30/18 1209 98.1 F (36.7 C)     Temp Source 07/30/18 1209 Oral     SpO2 07/30/18 1209 91 %     Weight 07/30/18 1205 189 lb (85.7 kg)     Height 07/30/18 1205 5\' 7"  (1.702 m)     Head Circumference --      Peak Flow --      Pain Score 07/30/18 1205 0     Pain Loc --      Pain Edu? --      Excl. in [place]? --     Constitutional: Alert and oriented. Well appearing and in no acute distress. Eyes: Conjunctivae are normal. PERRL. EOMI. Head: Atraumatic. Nose: No congestion/rhinnorhea. Mouth/Throat: Mucous membranes are moist.  Oropharynx non-erythematous. Neck: No stridor.  No meningeal signs.   Cardiovascular: Normal rate, regular rhythm. Good peripheral circulation. Grossly normal heart sounds.   Respiratory: Normal respiratory effort.  No retractions. Lungs fine crackles. Gastrointestinal: Soft and nontender. No distention.  usculoskeletal: No lower extremity tenderness nor edema. No gross deformities of extremities. Neurologic:  Normal speech and language. No gross focal neurologic deficits are appreciated.  Skin:  Skin is warm, dry and intact. No rash noted.   ____________________________________________   LABS (all labs ordered are listed, but only abnormal results are displayed)  Labs Reviewed  CBC WITH DIFFERENTIAL/PLATELET - Abnormal; Notable for the following components:      Result Value   RBC 3.33 (*)    Hemoglobin 9.8 (*)    HCT 31.7 (*)    RDW 16.6 (*)    All other components within normal limits  COMPREHENSIVE METABOLIC PANEL - Abnormal; Notable for the following  components:   Glucose, Bld 118 (*)    BUN 49 (*)    Creatinine, Ser 1.58 (*)    GFR calc non Af Amer 29 (*)    GFR calc Af Amer 34 (*)    All other components within normal limits  TROPONIN I - Abnormal; Notable for the following components:   Troponin I 0.04 (*)    All other components within normal limits  BRAIN NATRIURETIC PEPTIDE - Abnormal; Notable for the following components:   B Natriuretic Peptide 320.0 (*)    All other components within normal limits  TROPONIN I - Abnormal; Notable for the following components:   Troponin I 0.04 (*)    All other components within normal limits   ____________________________________________  EKG   EKG Interpretation  Date/Time:  Tuesday July 30 2018 12:08:33 EST Ventricular Rate:  71 PR Interval:    QRS Duration: 186 QT Interval:  476 QTC Calculation: 518 R Axis:   94 Text Interpretation:  paced rhythm RBBB and LPFB Baseline wander in lead(s) V3 V5 Confirmed by Merrily Pew [phone]) on 07/30/2018 1:34:56 PM       ____________________________________________  RADIOLOGY  Dg Chest 2 View  Result Date: 07/30/2018 CLINICAL DATA:  Short of breath for several days EXAM: CHEST - 2 VIEW COMPARISON:  Chest x-ray of 12/09/2017, 11/01/2017, and CT chest of 11/16/2016 FINDINGS: There is a fine reticulonodular pattern throughout the lungs of uncertain significance, possibly chronic. This does appear to have been present chest x-ray of 12/09/2017 but is not definitely seen on the chest x-ray of 11/01/2017 or the CT of 11/16/2016. Repeat CT the chest may be warranted to evaluate the lung parenchyma in more detail. Mild cardiomegaly is stable and AICD leads remain. Median sternotomy sutures are unchanged. The bones are osteopenic and a partially compressed midthoracic vertebral body is unchanged. There is fusiform aneurysmal dilatation of the distal thoracic aorta at the level of the GE junction where measures 4.2 cm on the lateral view. This  focal enlargement of the distal thoracic aorta does appear to have been present on the prior chest x-ray of 11/01/2016 and on the CT of 2018. IMPRESSION: 1. Fine reticulonodular pattern throughout the lungs of uncertain significance. Consider CT of the chest without IV contrast to assess further. 2. No pneumonia or effusion. 3. Stable cardiomegaly with AICD leads. 4. Stable fusiform dilatation of the distal thoracic aorta. Electronically Signed   By: Ivar Drape M.D.   On: 07/30/2018 13:11   Ct Chest Wo Contrast  Result Date: 07/30/2018 CLINICAL DATA:  Shortness of breath for several days. Productive cough. EXAM: CT CHEST WITHOUT CONTRAST TECHNIQUE: Multidetector CT imaging of the chest was performed following the standard protocol without IV contrast. COMPARISON:  Chest radiographs 07/30/2018 and 12/09/2017. Chest CT 11/16/2016. FINDINGS: Cardiovascular: There is extensive atherosclerosis of the aorta, great vessels and coronary arteries status post median sternotomy, aortic and mitral valve replacement. There is central enlargement of the pulmonary arteries consistent with pulmonary arterial hypertension. No acute vascular findings are demonstrated. Left subclavian AICD leads appear unchanged. There is stable moderate cardiomegaly and trace pericardial fluid. Mediastinum/Nodes: There are no enlarged mediastinal, hilar or axillary lymph nodes.Hilar assessment is limited by the lack of intravenous contrast, although the hilar contours appear unchanged. The thyroid gland, trachea and esophagus demonstrate no significant findings. Lungs/Pleura: There is no pleural effusion or pneumothorax. There is mild underlying centrilobular emphysema. There is mild diffuse reticulonodular interstitial prominence, especially within the right upper lobe. This does not appear acute, although is slightly progressive from 2018. There is no confluent airspace opacity, endobronchial lesion or suspicious pulmonary nodule. Upper  abdomen: The spleen is chronically enlarged by multiple peripherally calcified low-density lesions, grossly stable from prior studies dating back to at least 2017. There is diffuse  aortic and branch vessel atherosclerosis. No acute findings are seen within the visualized upper abdomen. Musculoskeletal/Chest wall: There is no chest wall mass or suspicious osseous finding. Previous median sternotomy. Probable developmental variant of the T7 vertebral body, stable. IMPRESSION: 1. Diffuse reticulonodular interstitial prominence, greatest in the right upper lobe and mildly progressive from 11/16/2016. This favors chronic interstitial lung disease superimposed on emphysema. Consider pneumoconioses and hypersensitivity pneumonitis. No confluent airspace opacity or definite edema. 2. Stable cardiomegaly post open heart surgery. Central enlargement of the pulmonary arteries consistent with pulmonary arterial hypertension. 3.  Aortic Atherosclerosis (ICD10-I70.0). 4. Stable enlargement of the spleen by multiple peripherally calcified low-density masses. Electronically Signed   By: Richardean Sale M.D.   On: 07/30/2018 14:50    ____________________________________________   PROCEDURES  Procedure(s) performed:   Procedures   ____________________________________________   INITIAL IMPRESSION / ASSESSMENT AND PLAN / ED COURSE  Labs and imaging as above.  Suspect likely CHF exacerbation.  Will increase her diuretics at home as she is not hypoxic here.  Delta troponins were the same making that unlikely be related ACS but more related to just general heart failure.  Follow-up with PCP/cardiology     Pertinent labs & imaging results that were available during my care of the patient were reviewed by me and considered in my medical decision making (see chart for details).  ____________________________________________  FINAL CLINICAL IMPRESSION(S) / ED DIAGNOSES  Final diagnoses:  Acute on chronic  congestive heart failure, unspecified heart failure type ([place])     MEDICATIONS GIVEN DURING THIS VISIT:  Medications  furosemide (LASIX) injection 80 mg (80 mg Intravenous Given 07/30/18 1444)     NEW OUTPATIENT MEDICATIONS STARTED DURING THIS VISIT:  Discharge Medication List as of 07/30/2018  4:57 PM      Note:  This note was prepared with assistance of Dragon voice recognition software. Occasional wrong-word or sound-a-like substitutions may have occurred due to the inherent limitations of voice recognition software.   Merrily Pew, MD 07/30/18 2131

## 2018-07-30 NOTE — ED Notes (Signed)
Patient transported to CT 

## 2018-07-30 NOTE — ED Triage Notes (Signed)
Pt states she has been SOB for several days. Denies BLE edema. White productive cough.

## 2018-07-30 NOTE — ED Notes (Signed)
CRITICAL VALUE ALERT  Critical Value:  Troponin 0.04  Date & Time Notied:  07/30/2018, 1320  Provider Notified: Dr. Dayna Barker  Orders Received/Actions taken: See chart

## 2018-08-02 ENCOUNTER — Encounter: Payer: Self-pay | Admitting: Cardiovascular Disease

## 2018-08-02 ENCOUNTER — Ambulatory Visit (INDEPENDENT_AMBULATORY_CARE_PROVIDER_SITE_OTHER): Payer: Medicare Other | Admitting: Cardiovascular Disease

## 2018-08-02 VITALS — BP 128/58 | HR 69 | Ht 67.0 in | Wt 201.0 lb

## 2018-08-02 DIAGNOSIS — I10 Essential (primary) hypertension: Secondary | ICD-10-CM | POA: Diagnosis not present

## 2018-08-02 DIAGNOSIS — Z79899 Other long term (current) drug therapy: Secondary | ICD-10-CM | POA: Diagnosis not present

## 2018-08-02 DIAGNOSIS — E669 Obesity, unspecified: Secondary | ICD-10-CM

## 2018-08-02 DIAGNOSIS — Z952 Presence of prosthetic heart valve: Secondary | ICD-10-CM | POA: Diagnosis not present

## 2018-08-02 DIAGNOSIS — N184 Chronic kidney disease, stage 4 (severe): Secondary | ICD-10-CM | POA: Diagnosis not present

## 2018-08-02 DIAGNOSIS — I4819 Other persistent atrial fibrillation: Secondary | ICD-10-CM | POA: Diagnosis not present

## 2018-08-02 DIAGNOSIS — I442 Atrioventricular block, complete: Secondary | ICD-10-CM

## 2018-08-02 DIAGNOSIS — D594 Other nonautoimmune hemolytic anemias: Secondary | ICD-10-CM | POA: Diagnosis not present

## 2018-08-02 DIAGNOSIS — I5032 Chronic diastolic (congestive) heart failure: Secondary | ICD-10-CM | POA: Diagnosis not present

## 2018-08-02 DIAGNOSIS — I255 Ischemic cardiomyopathy: Secondary | ICD-10-CM | POA: Diagnosis not present

## 2018-08-02 DIAGNOSIS — Z95810 Presence of automatic (implantable) cardiac defibrillator: Secondary | ICD-10-CM | POA: Diagnosis not present

## 2018-08-02 MED ORDER — METOLAZONE 2.5 MG PO TABS: 2.5000 mg | ORAL_TABLET | ORAL | 3 refills | Status: DC

## 2018-08-02 MED ORDER — FUROSEMIDE 40 MG PO TABS: 40.0000 mg | ORAL_TABLET | Freq: Every day | ORAL | 3 refills | Status: DC

## 2018-08-02 NOTE — Patient Instructions (Signed)
Medication Instructions:  Dr Sallyanne Kuster has recommended making the following medication changes: 1. INCREASE Metolazone to 1 tablet twice weekly - Wednesdays and Saturdays  If you need a refill on your cardiac medications before your next appointment, please call your pharmacy.   Lab work: Your physician recommends that you return for lab work on November 18th, 2019.  If you have labs (blood work) drawn today and your tests are completely normal, you will receive your results only by: Marland Kitchen MyChart Message (if you have MyChart) OR . A paper copy in the mail If you have any lab test that is abnormal or we need to change your treatment, we will call you to review the results.  Follow-Up: At [facility], you and your health needs are our priority.  As part of our continuing mission to provide you with exceptional heart care, we have created designated Provider Care Teams.  These Care Teams include your primary Cardiologist (physician) and Advanced Practice Providers (APPs -  Physician Assistants and Nurse Practitioners) who all work together to provide you with the care you need, when you need it. You will need a follow up appointment in 3 months. You may see Sanda Klein, MD or one of the following Advanced Practice Providers on your designated Care Team: [place], [place] . Fabian Sharp, PA-C

## 2018-08-02 NOTE — Addendum Note (Signed)
Addended by: Sanda Klein on: 08/02/2018 03:53 PM   Modules accepted: Orders

## 2018-08-02 NOTE — Progress Notes (Signed)
Cardiology Office Note    Date:  08/02/2018   ID:  [name] [name], DOB Jan 09, 1932, MRN [id number]  PCP:  Fayrene Helper, MD  Cardiologist:  Cristopher Peru, M.D. (EP/ICD); Sanda Klein, MD   Chief Complaint  Patient presents with  . Follow-up    Emergency room visit for dyspnea/heart failure exacerbation  . Cardiac Valve Problem    Aortic and mitral mechanical valve prostheses  . Atrial Fibrillation    Recurrent persistent    History of Present Illness:  [name] [name] is a 84 y.o. female returning in follow-up for valvular heart disease, persistent atrial fibrillation, complete heart block and chronic combined systolic and diastolic heart failure.  She was seen in the emergency room at any roughly a week ago for shortness of breath.  She was felt to have congestive heart failure exacerbation and improved after a dose of diuretic.  She is on home oxygen, now on 1 L/min with a portable tank.  She says that she has improved.  I believe this is her first serious episode of heartburn exacerbation since March, when she was hospitalized.  She is taking furosemide 40 mg daily and metolazone 2.5 mg weekly on Wednesday.  She denies angina pectoris.  She has not had dizziness or palpitations.  She does not have orthopnea or PND.  She would like to get bit of the oxygen.  On room air her oxygen saturation is 90%, although she appears comfortable on 1 L oxygen her oxygen saturation increases to 93-94%.  She is in a wheelchair today and we could not weigh her.  She does not have much in the way of leg edema.  When she was discharged from the hospital in March she weighed 213 pounds.  When I saw her in July she weighed just over 202 pounds.  Today she weighs 201 pounds.  She's had a lot more atrial fibrillation since I last saw her.  Interrogation of her defibrillator shows about a week of atrial fibrillation this morning, but then persistent atrial fibrillation since late July for over 3 months.   She continues to have 99.5% biventricular pacing.  She has not had any high ventricular rates.  Her OptiVol has generally been stable this year, although it has for approximately "threshold" a couple of times in April and then especially in July.  There appears to be no correlation between her her arrhythmia and volume overload.  When she was seen in the emergency room last week her OptiVol had been heading upwards, but it has rebounded quickly just over the last few months.  The heart rate histogram is very blunted but she is extremely sedentary.  She is completely unaware of the arrhythmia.  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  Her last anticoagulation level at her hospital visit was in the desirable range.  She is scheduled for Coumadin clinic visit on November 18.  Also at her last ED visit, her renal parameters were slightly better than her previous baseline with a creatinine 1.58 (estimated GFR 34).  Potassium was normal at 4.0.  Her device is functioning normally and there has ben no recent ventricular arrhythmia. 100% BiV paced. Estimated generator longevity 4 months.  Lead parameters remain stable.  Her device is routinely followed by Dr. Crissie Sickles.   She is pacemaker dependent due to complete heart block following aortic and mitral valve replacement with mechanical prostheses. (AVR 2001, MVR  2004, both [employer] prostheses, surgeries performed in [place]). She has severe cardiomyopathy with an estimated left ventricular ejection fraction of 25%, unchanged since 2004. She did not have any coronary artery disease by angiography before her first operation. In May 2016 her initial pacemaker lead was extracted and she received a CRT-D Medtronic Viva XT device by Dr. Cristopher Peru.  She has had problems with multifactorial anemia, including a component of prosthesis related hemolytic anemia, baseline hemoglobin  around 10.   Past Medical History:  Diagnosis Date  . Adenomatous polyp of colon 12/16/2002   Dr. [name]/[facility]  . Allergy   . Cataract   . CHB (complete heart block) ([place]) 10/07/2014      . CHF (congestive heart failure) ([place])    a.  EF previously reduced to 25-30% b. EF improved to 60-65% by echo in 10/2017.   Marland Kitchen Chronic kidney disease, stage I 2006  . Constipation       . COPD (chronic obstructive pulmonary disease) ([place])       . DJD (degenerative joint disease) of lumbar spine   . DM (diabetes mellitus) ([place]) 2006  . Esophageal reflux   . Glaucoma   . Gout       . H/O heart valve replacement with mechanical valve    a. s/p mechanical AVR in 2001 and mechanical MVR in 2004.  Marland Kitchen H/O wisdom tooth extraction   . Hyperlipemia   . IDA (iron deficiency anemia)    Parenteral iron/[name]  . Insomnia       . Non-ischemic cardiomyopathy ([place])    a. s/p MDT CRTD  . Obesity, unspecified   . Other and unspecified hyperlipidemia   . Oxygen deficiency 2014   nocturnal;  . Spondylolisthesis   . Spondylosis   . Thyroid cancer ([place])    remote thyroidectomy, no recurrence, pt denies in 2016 thyroid cancer  . Unspecified hypothyroidism       . 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 Vertigo         Past Surgical History:  Procedure Laterality Date  . AORTIC AND MITRAL VALVE REPLACEMENT  2001  . BI-VENTRICULAR IMPLANTABLE CARDIOVERTER DEFIBRILLATOR UPGRADE N/A 01/18/2015   Procedure: BI-VENTRICULAR IMPLANTABLE CARDIOVERTER DEFIBRILLATOR UPGRADE;  Surgeon: Evans Lance, MD;  Location: [facility] CATH LAB;  Service: Cardiovascular;  Laterality: N/A;  . CARDIAC CATHETERIZATION    . CARDIAC VALVE REPLACEMENT    . CARDIOVERSION N/A 11/13/2016   Procedure: CARDIOVERSION;  Surgeon: Sanda Klein, MD;  Location: [facility] ENDOSCOPY;  Service: Cardiovascular;  Laterality: N/A;  . COLONOSCOPY  06/12/2007   Rourk- Normal rectum/Normal  colon  . COLONOSCOPY  12/16/02   small hemorrhoids  . COLONOSCOPY  06/20/2012   Procedure: COLONOSCOPY;  Surgeon: Daneil Dolin, MD;  Location: [facility] ENDO SUITE;  Service: Endoscopy;  Laterality: N/A;  10:45  . defibrillator implanted 2006    . DOPPLER ECHOCARDIOGRAPHY  2012  . DOPPLER ECHOCARDIOGRAPHY  05,06,07,08,09,2011  . ESOPHAGOGASTRODUODENOSCOPY  06/12/2007   Rourk- normal esophagus, small hiatal hernia, otherwise normal stomach, D1, D2  . EYE SURGERY Right 2005  . EYE SURGERY Left 2006  . ICD LEAD REMOVAL Left 02/08/2015   Procedure: ICD LEAD REMOVAL;  Surgeon: Evans Lance, MD;  Location: [facility] OR;  Service: Cardiovascular;  Laterality: Left;  "Will plan extraction and insertion of a BiV PM"  **Dr. Roxan Hockey backing up case**  . IMPLANTABLE CARDIOVERTER DEFIBRILLATOR (ICD) GENERATOR CHANGE Left  02/08/2015   Procedure: ICD GENERATOR CHANGE;  Surgeon: Evans Lance, MD;  Location: [facility];  Service: Cardiovascular;  Laterality: Left;  . INSERT / REPLACE / REMOVE PACEMAKER    . PACEMAKER INSERTION  May 2016  . pacemaker placed  2004  . right breast cyst removed     benign   . right cataract removed     2005  . TEE WITHOUT CARDIOVERSION N/A 11/13/2016   Procedure: TRANSESOPHAGEAL ECHOCARDIOGRAM (TEE);  Surgeon: Sanda Klein, MD;  Location: [facility] ENDOSCOPY;  Service: Cardiovascular;  Laterality: N/A;  . THYROIDECTOMY      Current Medications: Outpatient Medications Prior to Visit  Medication Sig Dispense Refill  . acetaminophen (TYLENOL) 500 MG tablet Take 500 mg by mouth every 6 (six) hours as needed (pain).    Marland Kitchen albuterol (PROVENTIL HFA;VENTOLIN HFA) 108 (90 Base) MCG/ACT inhaler INHALE 2 PUFFS INTO THE LUNGS EVERY 6 HOURS AS NEEDED FOR WHEEZING OR SHORTNESS OF BREATH (Patient taking differently: Inhale 2 puffs into the lungs every 6 (six) hours as needed for shortness of breath. INHALE 2 PUFFS INTO THE LUNGS EVERY 6 HOURS AS NEEDED FOR WHEEZING OR SHORTNESS OF BREATH) 54 g 2  .  albuterol (PROVENTIL) (2.5 MG/3ML) 0.083% nebulizer solution INHALE 1 VIAL VIA NEBULIZER THREE TIMES DAILY. (Patient taking differently: Take 2.5 mg by nebulization 2 (two) times daily as needed for shortness of breath. ) 90 vial 5  . allopurinol (ZYLOPRIM) 100 MG tablet TAKE 1 TABLET DAILY (Patient taking differently: Take 100 mg by mouth daily. ) 90 tablet 1  . BREO ELLIPTA 100-25 MCG/INH AEPB Inhale 1 puff into the lungs daily.   1  . calcium carbonate (OS-CAL) 1250 (500 Ca) MG chewable tablet Chew 1 tablet by mouth daily.    Marland Kitchen COUMADIN 5 MG tablet Take 2 tablets daily except 1 tablet on Tuesdays and Saturdays (Patient taking differently: Take 5-10 mg by mouth See admin instructions. Take 10 mg daily except 5mg  on Tuesdays and Fridays) 100 tablet 0  . digoxin (LANOXIN) 0.125 MG tablet Take 1 tablet (0.125 mg total) by mouth every other day. 45 tablet 3  . docusate sodium (COLACE) 100 MG capsule Take 300 mg by mouth at bedtime.    . fluticasone (FLONASE) 50 MCG/ACT nasal spray Place 2 sprays into both nostrils daily. Pt needs now 48 g 1  . folic acid (FOLVITE) 1 MG tablet TAKE 1 TABLET BY MOUTH ONCE DAILY (Patient taking differently: Take 1 mg by mouth daily. ) 100 tablet 0  . gabapentin (NEURONTIN) 300 MG capsule TAKE 1 CAPSULE BY MOUTH AT BEDTIME (Patient taking differently: Take 300 mg by mouth at bedtime. ) 90 capsule 0  . guaiFENesin (MUCINEX) 600 MG 12 hr tablet Take 1 tablet (600 mg total) by mouth 2 (two) times daily. 60 tablet 2  . hydrALAZINE (APRESOLINE) 25 MG tablet TAKE 1 TABLET THREE TIMES A DAY (Patient taking differently: Take 25 mg by mouth 3 (three) times daily. ) 270 tablet 0  . HYDROcodone-acetaminophen (NORCO) 7.5-325 MG tablet Take 1 tablet by mouth every 6 (six) hours as needed for moderate pain (Must last 30 days.). 60 tablet 0  . isosorbide mononitrate (IMDUR) 60 MG 24 hr tablet Take 1 tablet (60 mg total) by mouth daily. 90 tablet 1  . meclizine (ANTIVERT) 25 MG tablet Take  1 tablet (25 mg total) by mouth 3 (three) times daily as needed for dizziness. 30 tablet 0  . metoprolol succinate (TOPROL XL) 100 MG 24  hr tablet Take 1 tablet (100 mg total) by mouth daily. Take with or immediately following a meal. 30 tablet 5  . Multiple Vitamin (MULTIVITAMIN) LIQD Take 5 mLs by mouth daily.    . ondansetron (ZOFRAN) 4 MG tablet Take 1 tablet (4 mg total) by mouth daily as needed for nausea or vomiting. 90 tablet 1  . Polyethyl [name] (SYSTANE OP) Place 1 drop into both eyes daily as needed (dry eyes).    . potassium chloride SA (KLOR-CON M20) 20 MEQ tablet Take 1 tablet by mouth  daily (Patient taking differently: Take 20 mEq by mouth once. Take 1 tablet by mouth  daily) 90 tablet 1  . pravastatin (PRAVACHOL) 40 MG tablet Take 1 tablet (40 mg total) by mouth every evening. 90 tablet 3  . SYNTHROID 75 MCG tablet TAKE 1 TABLET DAILY (Patient taking differently: Take 75 mcg by mouth. TAKE 1 TABLET DAILY) 90 tablet 1  . furosemide (LASIX) 40 MG tablet Take 1 tablet by mouth  daily (Patient taking differently: Take 40 mg by mouth daily. Take 1 tablet by mouth  daily) 90 tablet 1  . metolazone (ZAROXOLYN) 2.5 MG tablet TAKE 1 TABLET ONCE A WEEK (Patient taking differently: Take 2.5 mg by mouth once a week. TAKE 1 TABLET ONCE A WEEK) 12 tablet 2   Facility-Administered Medications Prior to Visit  Medication Dose Route Frequency Provider Last Rate Last Dose  . epoetin alfa (EPOGEN,PROCRIT) injection 40,000 Units  40,000 Units Subcutaneous Once Farrel Gobble, MD         Allergies:   Penicillins; Aspirin; Fentanyl; and Niacin   Social History   Socioeconomic History  . Marital status: Widowed    Spouse name: Not on file  . Number of children: 0  . Years of education: Not on file  . Highest education level: Not on file  Occupational History  . Occupation: retired    Fish farm manager: RETIRED  Social Needs  . Financial resource strain: Not on file  . Food  insecurity:    Worry: Not on file    Inability: Not on file  . Transportation needs:    Medical: Not on file    Non-medical: Not on file  Tobacco Use  . Smoking status: Former Smoker    Packs/day: 0.75    Years: 40.00    Pack years: 30.00    Types: Cigarettes    Last attempt to quit: 03/08/1987    Years since quitting: 31.4  . Smokeless tobacco: Never Used  Substance and Sexual Activity  . Alcohol use: No    Alcohol/week: 0.0 standard drinks  . Drug use: No  . Sexual activity: Not Currently  Lifestyle  . Physical activity:    Days per week: Not on file    Minutes per session: Not on file  . Stress: Not on file  Relationships  . Social connections:    Talks on phone: Not on file    Gets together: Not on file    Attends religious service: Not on file    Active member of club or organization: Not on file    Attends meetings of clubs or organizations: Not on file    Relationship status: Not on file  Other Topics Concern  . Not on file  Social History Narrative   From [place] ([place])     Family History:  The patient's family history includes Diabetes in her brother and brother; Heart attack in her sister; Heart disease in her brother, brother, father,  and sister; Hypertension in her brother; Lung cancer in her brother; Pancreatic cancer in her mother.   ROS:   Please see the history of present illness.    ROS All other systems reviewed and are negative.   PHYSICAL EXAM:   VS:  BP (!) 128/58   Pulse 69   Ht 5\' 7"  (1.702 m)   Wt 201 lb (91.2 kg)   BMI 31.48 kg/m     General: Alert, oriented x3, no distress, mildly obese Head: no evidence of trauma, PERRL, EOMI, no exophtalmos or lid lag, no myxedema, no xanthelasma; normal ears, nose and oropharynx Neck: normal jugular venous pulsations and no hepatojugular reflux; brisk carotid pulses without delay and no carotid bruits Chest: clear to auscultation, no signs of consolidation by percussion or palpation, normal  fremitus, symmetrical and full respiratory excursions Cardiovascular: normal position and quality of the apical impulse, regular rhythm, crisp prosthetic valve clicks, 1/6 early peaking systolic ejection murmur in the aortic focus, no diastolic murmurs, rubs or gallops Abdomen: no tenderness or distention, no masses by palpation, no abnormal pulsatility or arterial bruits, normal bowel sounds, no hepatosplenomegaly Extremities: no clubbing, cyanosis or edema; 2+ radial, ulnar and brachial pulses bilaterally; 2+ right femoral, posterior tibial and dorsalis pedis pulses; 2+ left femoral, posterior tibial and dorsalis pedis pulses; no subclavian or femoral bruits Neurological: grossly nonfocal Psych: Normal mood and affect   Wt Readings from Last 3 Encounters:  08/02/18 201 lb (91.2 kg)  07/30/18 189 lb (85.7 kg)  07/17/18 201 lb (91.2 kg)      Studies/Labs Reviewed:   EKG:  EKG is not ordered today.  The intracardiac electrogram today demonstrates A sensed (atrial fibrillation), biV paced rhythm.  ECG from November 6 shows atrial fibrillation with biventricular pacing and a small R wave in lead V1.  Recent Labs: 12/09/2017: Magnesium 1.7 04/09/2018: TSH 1.07 07/30/2018: ALT 11; B Natriuretic Peptide 320.0; BUN 49; Creatinine, Ser 1.58; Hemoglobin 9.8; Platelets 201; Potassium 4.0; Sodium 135   Lipid Panel    Component Value Date/Time   CHOL 170 07/09/2018 1217   CHOL 165 10/16/2016 1213   TRIG 220 (H) 07/09/2018 1217   HDL 33 (L) 07/09/2018 1217   HDL 31 (L) 10/16/2016 1213   CHOLHDL 5.2 (H) 07/09/2018 1217   VLDL 52 (H) 02/21/2017 1010   LDLCALC 103 (H) 07/09/2018 1217     ASSESSMENT:    1. Chronic diastolic heart failure (HCC)   2. Persistent atrial fibrillation   3. Complete heart block ([place])   4. Biventricular automatic implantable cardioverter defibrillator in situ   5. H/O mitral valve replacement with mechanical valve   6. Hx of aortic valve replacement, mechanical     7. Other non-autoimmune hemolytic anemias ([place])   8. Essential hypertension   9. CKD (chronic kidney disease) stage 4, GFR 15-29 ml/min (HCC)   10. Mild obesity   11. Medication management      PLAN:  In order of problems listed above:  1. CHF: Clinically there are no signs of hypervolemia today, but it seems like she needs slightly more intense diuretic regimen.  Discussed sodium restriction.  We will increase the metolazone "booster" to twice weekly. Most recent EF 60-65 % by echo February 2019.  She is on appropriate treatment with hydralazine nitrates, metoprolol succinate.  She should stop taking the digoxin since she has normal left ventricular systolic function and complete heart block. 2. AFib: There is never been any clear connection between her clinical  status and the presence or absence of atrial fibrillation.  Atrial fibrillation started in July, but she did not begin having problems with heart failure until November.  I do not think cardioversion would be beneficial. 3. CHB: Pacemaker dependent. 4. CRT-D: normal device function.  Excellent response to resynchronization therapy.  Approaching ERI, anticipated early next year.  Followed by Dr. Lovena Le. 5. Mechanical mitral valve prosthesis: Echo February 2019 shows mean gradient of 4 mmHg, pressure half-time 100 ms 6. Mechanical aortic valve prosthesis: Echo February 2019 shows gradient 14 mmHg and dimensionless index 0.3 7. Prosthetic valve associated hemolysis: Hemoglobin stable 9 8. HTN: Excellent control, no diastolic blood pressure but without symptoms of hypotension 9. CKD 4: Creatinine was better than her baseline when she was seen in the emergency room on November 1.  Asked her to have repeat labs when she goes into Coumadin clinic on November 18. 10. Obesity: Additional weight loss would be beneficial.    Medication Adjustments/Labs and Tests Ordered: Current medicines are reviewed at length with the patient today.   Concerns regarding medicines are outlined above.  Medication changes, Labs and Tests ordered today are listed in the Patient Instructions below. Patient Instructions  Medication Instructions:  Dr Sallyanne Kuster has recommended making the following medication changes: 1. INCREASE Metolazone to 1 tablet twice weekly - Wednesdays and Saturdays  If you need a refill on your cardiac medications before your next appointment, please call your pharmacy.   Lab work: Your physician recommends that you return for lab work on November 18th, 2019.  If you have labs (blood work) drawn today and your tests are completely normal, you will receive your results only by: Marland Kitchen MyChart Message (if you have MyChart) OR . A paper copy in the mail If you have any lab test that is abnormal or we need to change your treatment, we will call you to review the results.  Follow-Up: At [facility], you and your health needs are our priority.  As part of our continuing mission to provide you with exceptional heart care, we have created designated Provider Care Teams.  These Care Teams include your primary Cardiologist (physician) and Advanced Practice Providers (APPs -  Physician Assistants and Nurse Practitioners) who all work together to provide you with the care you need, when you need it. You will need a follow up appointment in 3 months. You may see Sanda Klein, MD or one of the following Advanced Practice Providers on your designated Care Team: [place], [place] . Fabian Sharp, PA-C    Signed, Sanda Klein, MD  08/02/2018 2:03 PM    [address], [place], [place]  [address] Phone: [phone]; Fax: [phone]

## 2018-08-05 ENCOUNTER — Other Ambulatory Visit: Payer: Self-pay

## 2018-08-05 MED ORDER — ALLOPURINOL 100 MG PO TABS: 100.0000 mg | ORAL_TABLET | Freq: Every day | ORAL | 1 refills | Status: DC

## 2018-08-05 MED ORDER — FUROSEMIDE 40 MG PO TABS: 40.0000 mg | ORAL_TABLET | Freq: Every day | ORAL | 1 refills | Status: DC

## 2018-08-05 MED ORDER — SYNTHROID 75 MCG PO TABS: ORAL_TABLET | ORAL | 1 refills | Status: DC

## 2018-08-06 ENCOUNTER — Other Ambulatory Visit: Payer: Self-pay | Admitting: Adult Health

## 2018-08-06 ENCOUNTER — Other Ambulatory Visit: Payer: Self-pay | Admitting: Family Medicine

## 2018-08-06 ENCOUNTER — Other Ambulatory Visit: Payer: Self-pay | Admitting: Cardiovascular Disease

## 2018-08-06 NOTE — Telephone Encounter (Signed)
Rx request sent to pharmacy.  

## 2018-08-08 ENCOUNTER — Ambulatory Visit (INDEPENDENT_AMBULATORY_CARE_PROVIDER_SITE_OTHER): Payer: Medicare Other

## 2018-08-08 DIAGNOSIS — I5032 Chronic diastolic (congestive) heart failure: Secondary | ICD-10-CM

## 2018-08-08 DIAGNOSIS — Z95810 Presence of automatic (implantable) cardiac defibrillator: Secondary | ICD-10-CM

## 2018-08-09 NOTE — Progress Notes (Signed)
EPIC Encounter for ICM Monitoring  Patient Name: [name] [name] is a 84 y.o. female Date: 08/09/2018 Primary Care Physican: Fayrene Helper, MD Primary Cardiologist:Croitoru Electrophysiologist:Taylor Bi-V Pacing:99.4% Last Weight: 201lbs Today's Weight: 194.6 lbs   Clinical Status (02-Aug-2018 to 08-Aug-2018)  Treated VT/VF 0 episodes   AT/AF 1 episode  Time in AT/AF 24.0 hr/day (100.0%)  Longest AT/AF 39 days  Observations (1) (02-Aug-2018 to 08-Aug-2018)  AT/AF >= 6 hr for 6 days.       Heart Failure questions reviewed, pt asymptomatic.  Explained to patient to call either myself or Dr Croitoru's office if she starts to develop fluid symptoms which may prevent an ER visit.  Advised if she is struggling to breathe then she should call 911 or use local ER.  ER visit on 11/5 for fluid symptoms.   Thoracic impedance normal since ER visit.   Prescribed: Furosemide40 mg 1 tablet daily. Metolazaone 2.5 mg Take 1tablet twice a week (Wednesday and Saturdays). Potassium 20 mEq take 1 tablet daily.  Labs: 07/30/2018 Creatinine 1.58, BUN 49, Potassium 4.0, Sodium 135, eGFR 29-34 07/09/2018 Creatinine 1.60, BUN 48, Potassium 1.9, Sodium 136, eGFR 29-34 04/09/2018 Creatinine 1.91, BUN 53, Potassium 4.4, Sodium 135 03/30/2018 Creatinine 1.78, BUN 54, Potassium 3.9, Sodium 133, EGFR 25-29 12/11/2017 Creatinine1.89, BUN44, Potassium4.2, Sodium139, [id number] 12/09/2017 Creatinine1.86, BUN51, Potassium4.1, [id number], [id number]  11/17/2017 Creatinine2.36, BUN79, Potassium4.5, [id number], [id number]  11/16/2017 Creatinine2.44, BUN68, Potassium4.1, Sodium136, [id number]  11/15/2017 Creatinine2.34, BUN67, Potassium5.2, [id number], [id number]  11/07/2017 Creatinine2.17, BUN55, Potassium4.7, [id number], [id number]  11/02/2017 Creatinine1.74, BUN55, Potassium4.2, [id number], [id number]  11/01/2017 Creatinine1.93, BUN54, Potassium3.9, [id number],  [id number]  10/31/2017 Creatinine2.03, BUN48, Potassium4.5, [id number], [id number] A complete set of results can be found in Results Review.  Recommendations: No changes.  Discussed limiting salt intake to < 2000 mg daily.  Encouraged to call for fluid symptoms.  Follow-up plan: ICM clinic phone appointment on 08/26/2018.    Copy of ICM check sent to Dr. Sallyanne Kuster.   3 month ICM trend: 08/09/2018    1 Year ICM trend:       [name], RN 08/09/2018 9:47 AM

## 2018-08-09 NOTE — Progress Notes (Signed)
Excellent, thanks. 

## 2018-08-12 ENCOUNTER — Ambulatory Visit (INDEPENDENT_AMBULATORY_CARE_PROVIDER_SITE_OTHER): Payer: Medicare Other | Admitting: *Deleted

## 2018-08-12 DIAGNOSIS — I4819 Other persistent atrial fibrillation: Secondary | ICD-10-CM

## 2018-08-12 DIAGNOSIS — Z5181 Encounter for therapeutic drug level monitoring: Secondary | ICD-10-CM | POA: Diagnosis not present

## 2018-08-12 DIAGNOSIS — Z952 Presence of prosthetic heart valve: Secondary | ICD-10-CM | POA: Diagnosis not present

## 2018-08-12 LAB — POCT INR: INR: 2 (ref 2.0–3.0)

## 2018-08-12 MED ORDER — COUMADIN 5 MG PO TABS: ORAL_TABLET | ORAL | 3 refills | Status: DC

## 2018-08-12 NOTE — Patient Instructions (Signed)
Continue coumadin 2 tablets daily except 1 tablet on Tuesdays and Saturdays.  Continue greens  Recheck in 4 weeks.

## 2018-08-14 ENCOUNTER — Telehealth: Payer: Self-pay | Admitting: Family Medicine

## 2018-08-14 NOTE — Telephone Encounter (Signed)
Please send Flonase to [facility] on [address]

## 2018-08-15 ENCOUNTER — Other Ambulatory Visit (HOSPITAL_COMMUNITY)
Admission: RE | Admit: 2018-08-15 | Discharge: 2018-08-15 | Disposition: A | Discharge status: Home / Self Care | Payer: Medicare Other | Source: Ambulatory Visit | Attending: Cardiovascular Disease | Admitting: Cardiovascular Disease

## 2018-08-15 ENCOUNTER — Ambulatory Visit (INDEPENDENT_AMBULATORY_CARE_PROVIDER_SITE_OTHER): Payer: Medicare Other | Admitting: Orthopaedic Surgery

## 2018-08-15 ENCOUNTER — Encounter: Payer: Self-pay | Admitting: Orthopaedic Surgery

## 2018-08-15 ENCOUNTER — Other Ambulatory Visit: Payer: Self-pay

## 2018-08-15 VITALS — Ht 67.0 in | Wt 192.0 lb

## 2018-08-15 DIAGNOSIS — I5032 Chronic diastolic (congestive) heart failure: Secondary | ICD-10-CM | POA: Diagnosis not present

## 2018-08-15 DIAGNOSIS — M25561 Pain in right knee: Secondary | ICD-10-CM | POA: Diagnosis not present

## 2018-08-15 DIAGNOSIS — Z79899 Other long term (current) drug therapy: Secondary | ICD-10-CM | POA: Diagnosis not present

## 2018-08-15 DIAGNOSIS — G8929 Other chronic pain: Secondary | ICD-10-CM

## 2018-08-15 LAB — BASIC METABOLIC PANEL
Anion gap: 10 (ref 5–15)
BUN: 43 mg/dL — ABNORMAL HIGH (ref 8–23)
CO2: 30 mmol/L (ref 22–32)
Calcium: 9.5 mg/dL (ref 8.9–10.3)
Chloride: 95 mmol/L — ABNORMAL LOW (ref 98–111)
Creatinine, Ser: 1.63 mg/dL — ABNORMAL HIGH (ref 0.44–1.00)
GFR calc Af Amer: 32 mL/min — ABNORMAL LOW (ref >60)
GFR calc non Af Amer: 28 mL/min — ABNORMAL LOW (ref >60)
Glucose, Bld: 106 mg/dL — ABNORMAL HIGH (ref 70–99)
Potassium: 4.1 mmol/L (ref 3.5–5.1)
Sodium: 135 mmol/L (ref 135–145)

## 2018-08-15 MED ORDER — HYDROCODONE-ACETAMINOPHEN 7.5-325 MG PO TABS: 1.0000 | ORAL_TABLET | Freq: Four times a day (QID) | ORAL | 0 refills | Status: DC | PRN

## 2018-08-15 MED ORDER — FLUTICASONE PROPIONATE 50 MCG/ACT NA SUSP: 2.0000 | Freq: Every day | NASAL | 1 refills | Status: DC

## 2018-08-15 NOTE — Telephone Encounter (Signed)
Refill sent in to requested pharmacy 

## 2018-08-15 NOTE — Progress Notes (Signed)
CC:  I have pain of my right knee. I would like an injection.  The patient has chronic pain of the right knee.  There is no recent trauma.  There is no redness.  Injections in the past have helped.  The knee has no redness, has an effusion and crepitus present.  ROM of the right knee is 0-100.  Impression:  Chronic knee pain right  Return: 2 months  PROCEDURE NOTE:  The patient requests injections of the right knee , verbal consent was obtained.  The right knee was prepped appropriately after time out was performed.   Sterile technique was observed and injection of 1 cc of Depo-Medrol 40 mg with several cc's of plain xylocaine. Anesthesia was provided by ethyl chloride and a 20-gauge needle was used to inject the knee area. The injection was tolerated well.  A band aid dressing was applied.  The patient was advised to apply ice later today and tomorrow to the injection sight as needed.  I have reviewed the [place] web site prior to prescribing narcotic medicine for this patient.   She is also on supplemental oxygen and uses a cane.  Electronically Signed Sanjuana Kava, MD 11/21/20199:26 AM

## 2018-08-20 DIAGNOSIS — L851 Acquired keratosis [keratoderma] palmaris et plantaris: Secondary | ICD-10-CM | POA: Diagnosis not present

## 2018-08-20 DIAGNOSIS — E1142 Type 2 diabetes mellitus with diabetic polyneuropathy: Secondary | ICD-10-CM | POA: Diagnosis not present

## 2018-08-20 DIAGNOSIS — L603 Nail dystrophy: Secondary | ICD-10-CM | POA: Diagnosis not present

## 2018-08-26 ENCOUNTER — Ambulatory Visit (INDEPENDENT_AMBULATORY_CARE_PROVIDER_SITE_OTHER): Payer: Medicare Other

## 2018-08-26 ENCOUNTER — Telehealth: Payer: Self-pay | Admitting: *Deleted

## 2018-08-26 ENCOUNTER — Other Ambulatory Visit: Payer: Self-pay | Admitting: Family Medicine

## 2018-08-26 DIAGNOSIS — I5032 Chronic diastolic (congestive) heart failure: Secondary | ICD-10-CM

## 2018-08-26 DIAGNOSIS — Z95810 Presence of automatic (implantable) cardiac defibrillator: Secondary | ICD-10-CM

## 2018-08-26 NOTE — Progress Notes (Signed)
EPIC Encounter for ICM Monitoring  Patient Name: [name] [name] is a 84 y.o. female Date: 08/26/2018 Primary Care Physican: Fayrene Helper, MD Primary Cardiologist:Croitoru Electrophysiologist:Taylor Bi-V Pacing:99.8% Last Weight: 194.6lbs Today's Weight: 196 lbs   Clinical Status (08-Aug-2018 to 26-Aug-2018)  Treated VT/VF 0 episodes   AT/AF 2 episodes   Time in AT/AF 24.0 hr/day (100.0%)  Longest AT/AF 46 days  Observations (2) (08-Aug-2018 to 26-Aug-2018)  AT/AF >= 6 hr for 18 days.  Patient Activity less than 1 hr/day for 2 weeks.       Heart Failure questions reviewed, pt asymptomatic.  She has a cold/flu for the last week.   Thoracic impedance normal.   Prescribed: Furosemide40 mg 1 tablet daily. Metolazaone 2.5 mg Take 1tablet twice a week (Wednesday and Saturdays). Potassium 20 mEq take 1 tablet daily.  Labs: 07/30/2018 Creatinine 1.58, BUN 49, Potassium 4.0, Sodium 135, eGFR 29-34 07/09/2018 Creatinine 1.60, BUN 48, Potassium 1.9, Sodium 136, eGFR 29-34 04/09/2018 Creatinine 1.91, BUN 53, Potassium 4.4, Sodium 135 03/30/2018 Creatinine 1.78, BUN 54, Potassium 3.9, Sodium 133, EGFR 25-29 12/11/2017 Creatinine1.89, BUN44, Potassium4.2, Sodium139, [id number] A complete set of results can be found in Results Review.  Recommendations: No changes.  Encouraged to call for fluid symptoms.  Follow-up plan: ICM clinic phone appointment on 09/26/2018.    Copy of ICM check sent to Dr.Taylor and Dr Sallyanne Kuster.   3 month ICM trend: 08/26/2018    1 Year ICM trend:       [name], RN 08/26/2018 2:34 PM

## 2018-08-26 NOTE — Telephone Encounter (Signed)
Pt stated she has been sick and she is feeling better but would like to see if something can be called in for the cough. It has been going on since last week.

## 2018-08-27 NOTE — Telephone Encounter (Signed)
States she doesn't have much of a cough anymore but is having a lot of clear sinus drainage. Has been using the flonase and sugar free robitussin. I advised claritin or zyrterc but she wanted to see if you recommended that and if so wants it sent in as a prescription to [facility] [address]

## 2018-08-29 ENCOUNTER — Inpatient Hospital Stay (HOSPITAL_COMMUNITY): Payer: Medicare Other | Attending: Hematology | Admitting: Internal Medicine

## 2018-08-29 ENCOUNTER — Ambulatory Visit (HOSPITAL_COMMUNITY): Payer: Medicare Other

## 2018-08-29 ENCOUNTER — Inpatient Hospital Stay (HOSPITAL_COMMUNITY): Payer: Medicare Other

## 2018-08-29 ENCOUNTER — Other Ambulatory Visit: Payer: Self-pay | Admitting: Family Medicine

## 2018-08-29 ENCOUNTER — Other Ambulatory Visit (HOSPITAL_COMMUNITY): Payer: Self-pay | Admitting: Internal Medicine

## 2018-08-29 ENCOUNTER — Other Ambulatory Visit: Payer: Self-pay

## 2018-08-29 VITALS — BP 104/55 | HR 69 | Temp 97.6°F | Resp 18

## 2018-08-29 DIAGNOSIS — N184 Chronic kidney disease, stage 4 (severe): Secondary | ICD-10-CM

## 2018-08-29 DIAGNOSIS — M2550 Pain in unspecified joint: Secondary | ICD-10-CM | POA: Diagnosis not present

## 2018-08-29 DIAGNOSIS — I130 Hypertensive heart and chronic kidney disease with heart failure and stage 1 through stage 4 chronic kidney disease, or unspecified chronic kidney disease: Secondary | ICD-10-CM | POA: Diagnosis not present

## 2018-08-29 DIAGNOSIS — D631 Anemia in chronic kidney disease: Secondary | ICD-10-CM | POA: Diagnosis not present

## 2018-08-29 DIAGNOSIS — E039 Hypothyroidism, unspecified: Secondary | ICD-10-CM

## 2018-08-29 DIAGNOSIS — D649 Anemia, unspecified: Secondary | ICD-10-CM

## 2018-08-29 DIAGNOSIS — Z79899 Other long term (current) drug therapy: Secondary | ICD-10-CM | POA: Insufficient documentation

## 2018-08-29 LAB — CBC WITH DIFFERENTIAL/PLATELET
Abs Immature Granulocytes: 0.01 10*3/uL (ref 0.00–0.07)
Basophils Absolute: 0 10*3/uL (ref 0.0–0.1)
Basophils Relative: 0 %
Eosinophils Absolute: 0 10*3/uL (ref 0.0–0.5)
Eosinophils Relative: 0 %
HCT: 29.7 % — ABNORMAL LOW (ref 36.0–46.0)
Hemoglobin: 9.2 g/dL — ABNORMAL LOW (ref 12.0–15.0)
Immature Granulocytes: 0 %
Lymphocytes Relative: 18 %
Lymphs Abs: 0.9 10*3/uL (ref 0.7–4.0)
MCH: 28.8 pg (ref 26.0–34.0)
MCHC: 31 g/dL (ref 30.0–36.0)
MCV: 93.1 fL (ref 80.0–100.0)
Monocytes Absolute: 0.5 10*3/uL (ref 0.1–1.0)
Monocytes Relative: 9 %
Neutro Abs: 3.8 10*3/uL (ref 1.7–7.7)
Neutrophils Relative %: 73 %
Platelets: 157 10*3/uL (ref 150–400)
RBC: 3.19 MIL/uL — ABNORMAL LOW (ref 3.87–5.11)
RDW: 16.1 % — ABNORMAL HIGH (ref 11.5–15.5)
WBC: 5.2 10*3/uL (ref 4.0–10.5)
nRBC: 0 % (ref 0.0–0.2)

## 2018-08-29 LAB — COMPREHENSIVE METABOLIC PANEL
ALT: 21 U/L (ref 0–44)
AST: 29 U/L (ref 15–41)
Albumin: 3.9 g/dL (ref 3.5–5.0)
Alkaline Phosphatase: 53 U/L (ref 38–126)
Anion gap: 8 (ref 5–15)
BUN: 49 mg/dL — ABNORMAL HIGH (ref 8–23)
CO2: 29 mmol/L (ref 22–32)
Calcium: 9.4 mg/dL (ref 8.9–10.3)
Chloride: 97 mmol/L — ABNORMAL LOW (ref 98–111)
Creatinine, Ser: 1.85 mg/dL — ABNORMAL HIGH (ref 0.44–1.00)
GFR calc Af Amer: 28 mL/min — ABNORMAL LOW (ref >60)
GFR calc non Af Amer: 25 mL/min — ABNORMAL LOW (ref >60)
Glucose, Bld: 109 mg/dL — ABNORMAL HIGH (ref 70–99)
Potassium: 4.2 mmol/L (ref 3.5–5.1)
Sodium: 134 mmol/L — ABNORMAL LOW (ref 135–145)
Total Bilirubin: 0.5 mg/dL (ref 0.3–1.2)
Total Protein: 8 g/dL (ref 6.5–8.1)

## 2018-08-29 LAB — FERRITIN: Ferritin: 661 ng/mL — ABNORMAL HIGH (ref 11–307)

## 2018-08-29 LAB — LACTATE DEHYDROGENASE: LDH: 185 U/L (ref 98–192)

## 2018-08-29 MED ORDER — EPOETIN ALFA 40000 UNIT/ML IJ SOLN
40000.0000 [IU] | Freq: Once | INTRAMUSCULAR | Status: AC
  Administered 2018-08-29: 40000 [IU] via SUBCUTANEOUS
  Filled 2018-08-29: qty 1

## 2018-08-29 MED ORDER — BENZONATATE 100 MG PO CAPS: 100.0000 mg | ORAL_CAPSULE | Freq: Four times a day (QID) | ORAL | 0 refills | Status: DC | PRN

## 2018-08-29 NOTE — Progress Notes (Signed)
Patient tolerated injection with no complaints voiced.  Site clean and dry with no bruising or swelling noted at site.  Band aid applied.  VSS with discharge and left by chair with family and no s/s of distress noted.

## 2018-08-29 NOTE — Patient Instructions (Signed)
[facility] at [facility]  Discharge Instructions:   _______________________________________________________________  Thank you for choosing [facility] at [facility] to provide your oncology and hematology care.  To afford each patient quality time with our providers, please arrive at least 15 minutes before your scheduled appointment.  You need to re-schedule your appointment if you arrive 10 or more minutes late.  We strive to give you quality time with our providers, and arriving late affects you and other patients whose appointments are after yours.  Also, if you no show three or more times for appointments you may be dismissed from the clinic.  Again, thank you for choosing [facility]. Our hope is that these requests will allow you access to exceptional care and in a timely manner. _______________________________________________________________  If you have questions after your visit, please contact our office at [phone] between the hours of 8:30 a.m. and 5:00 p.m. Voicemails left after 4:30 p.m. will not be returned until the following business day. _______________________________________________________________  For prescription refill requests, have your pharmacy contact our office. _______________________________________________________________  Recommendations made by the consultant and any test results will be sent to your referring physician. _______________________________________________________________ 

## 2018-08-29 NOTE — Telephone Encounter (Signed)
Message left on machine letting  her know medication ( tessalon perles) were sent in, pls check in the morning to see if she is aware, thanks

## 2018-08-29 NOTE — Progress Notes (Signed)
Diagnosis Anemia in stage 4 chronic kidney disease ([place]) - Plan: CBC with Differential/Platelet, CBC with Differential/Platelet, Comprehensive metabolic panel, Lactate dehydrogenase, Ferritin, Protein electrophoresis, serum, Vitamin B12, Folate  Staging Cancer Staging No matching staging information was found for the patient.  Assessment and Plan:  1.  Anemia.  Pt was followed by NP Renato Battles and anemia was thought to primarily be secondary to stage 4 chronic kidney disease, as well as possible hemolysis with mechanical mitral and aortic valve replacements. Also may have element of iron deficiency as well. She has been on Procrit that initially started ~3 years ago, with overall stability in her hemoglobin.  She is now on  Procrit injections 40,000 u  every 6 weeks.    Labs done 08/29/2018 reviewed and showed WBC 5.2 HB 9.2 plts 157,000.  Chemistries WNL with K+ 4.2 and normal LFTs.    HB not improving with current Procrit schedule. Pt will be placed on Procrit 40,000 u monthly with monthly CBC to determine if HB improves.  Pt will be seen for follow-up in 02/2019 with labs.    2.  CKD.  Cr on labs done 08/29/2018 was elevated at 1.85.  She is followed by nephrology.  Follow-up as directed.  Will repeat chemistries on RTC 02/2019.    3.  Chronic arthralgias.  Managed by Dr. Luna Glasgow. He manages her pain medications as well.   4.  HTN.  BP is 104/55.  Follow-up with PCP.    5.  Hypothyroidism.  She is on Synthroid.  Follow-up with PCP for monitoring.    Current Status:  Pt is seen today for follow-up.  She is here to go over labs and for evaluation prior to procrit.    Problem List Patient Active Problem List   Diagnosis Date Noted  . H/O mitral valve replacement with mechanical valve [Z95.2] 08/02/2018  . Chronic right hip pain [M25.551, G89.29] 04/16/2018  . Trochanteric bursitis, right hip [M70.61] 03/07/2017  . Persistent atrial fibrillation [I48.19]   . Nocturnal hypoxia [G47.34]  10/08/2016  . Dependence on nocturnal oxygen therapy [Z99.81] 10/08/2016  . S/P ICD (internal cardiac defibrillator) procedure [Z95.810] 02/08/2015  . CHB (complete heart block) ([place]) [I44.2] 10/07/2014  . Fatigue [R53.83] 07/20/2014  . Absolute anemia [D64.9] 06/24/2014  . Osteopenia [M85.80] 02/10/2014  . Encounter for therapeutic drug monitoring [Z51.81] 10/29/2013  . Spinal stenosis of lumbar region [M48.061] 02/19/2013  . OA (osteoarthritis) of knee [M17.10] 02/19/2013  . Chronic pain of right knee [M25.561, G89.29] 01/02/2013  . Hemolytic anemia ([place]) [D58.9] 09/24/2012  . H/O adenomatous polyp of colon [Z86.010] 05/24/2012  . High risk medication use [Z79.899] 05/24/2012  . COPD (chronic obstructive pulmonary disease) ([place]) [J44.9] 05/19/2012  . Chronic diastolic heart failure ([place]) [I50.32] 05/19/2012  . Chronic anticoagulation, on coumadin for Mechanical AVR and MVR [Z79.01] 04/01/2012  . Cardiorenal syndrome with renal failure [I13.10] 03/22/2012  . Hx of aortic valve replacement, mechanical [Z95.2] 03/22/2012  . S/P MVR (mitral valve replacement) [Z95.2] 03/22/2012  . Biventricular automatic implantable cardioverter defibrillator in situ [Z95.810] 03/22/2012  . CKD (chronic kidney disease), stage IV ([place]) [N18.4] 03/22/2012  . Warfarin-induced coagulopathy ([place]) [Y70.62, B76.283T] 03/21/2012  . Papilloma of breast [D24.9] 03/06/2012  . Iron deficiency [E61.1] 10/04/2011  . Allergic rhinitis [J30.9] 02/14/2011  . Nausea without vomiting [R11.0] 06/07/2010  . HIP PAIN, RIGHT [M25.559] 04/23/2009  . VARICOSE VEINS LOWER EXTREMITIES W/OTH COMPS [D17.616] 02/09/2009  . Calculus of gallbladder with chronic cholecystitis without obstruction [K80.10] 02/09/2009  .  Prediabetes [R73.03] 06/15/2008  . Hypothyroidism, postsurgical [E89.0] 02/24/2008  . Hyperlipidemia LDL goal <70 [E78.5] 02/24/2008  . Mild obesity [E66.9] 02/24/2008  . Essential hypertension [I10] 02/24/2008  .  Non-ischemic cardiomyopathy with ICD [I42.8] 02/24/2008  . GERD [K21.9] 02/24/2008  . DEGENERATIVE JOINT DISEASE, SPINE [M47.9] 02/24/2008    Past Medical History Past Medical History:  Diagnosis Date  . Adenomatous polyp of colon 12/16/2002   Dr. [name]/[facility]  . Allergy   . Cataract   . CHB (complete heart block) ([place]) 10/07/2014      . CHF (congestive heart failure) ([place])    a.  EF previously reduced to 25-30% b. EF improved to 60-65% by echo in 10/2017.   Marland Kitchen Chronic kidney disease, stage I 2006  . Constipation       . COPD (chronic obstructive pulmonary disease) ([place])       . DJD (degenerative joint disease) of lumbar spine   . DM (diabetes mellitus) ([place]) 2006  . Esophageal reflux   . Glaucoma   . Gout       . H/O heart valve replacement with mechanical valve    a. s/p mechanical AVR in 2001 and mechanical MVR in 2004.  Marland Kitchen H/O wisdom tooth extraction   . Hyperlipemia   . IDA (iron deficiency anemia)    Parenteral iron/[name]  . Insomnia       . Non-ischemic cardiomyopathy ([place])    a. s/p MDT CRTD  . Obesity, unspecified   . Other and unspecified hyperlipidemia   . Oxygen deficiency 2014   nocturnal;  . Spondylolisthesis   . Spondylosis   . Thyroid cancer ([place])    remote thyroidectomy, no recurrence, pt denies in 2016 thyroid cancer  . Unspecified hypothyroidism       . 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 Vertigo         Past Surgical History Past Surgical History:  Procedure Laterality Date  . AORTIC AND MITRAL VALVE REPLACEMENT  2001  . BI-VENTRICULAR IMPLANTABLE CARDIOVERTER DEFIBRILLATOR UPGRADE N/A 01/18/2015   Procedure: BI-VENTRICULAR IMPLANTABLE CARDIOVERTER DEFIBRILLATOR UPGRADE;  Surgeon: Evans Lance, MD;  Location: [facility] CATH LAB;  Service: Cardiovascular;  Laterality: N/A;  . CARDIAC CATHETERIZATION    . CARDIAC VALVE REPLACEMENT    . CARDIOVERSION N/A 11/13/2016    Procedure: CARDIOVERSION;  Surgeon: Sanda Klein, MD;  Location: [facility] ENDOSCOPY;  Service: Cardiovascular;  Laterality: N/A;  . COLONOSCOPY  06/12/2007   Rourk- Normal rectum/Normal colon  . COLONOSCOPY  12/16/02   small hemorrhoids  . COLONOSCOPY  06/20/2012   Procedure: COLONOSCOPY;  Surgeon: Daneil Dolin, MD;  Location: [facility] ENDO SUITE;  Service: Endoscopy;  Laterality: N/A;  10:45  . defibrillator implanted 2006    . DOPPLER ECHOCARDIOGRAPHY  2012  . DOPPLER ECHOCARDIOGRAPHY  05,06,07,08,09,2011  . ESOPHAGOGASTRODUODENOSCOPY  06/12/2007   Rourk- normal esophagus, small hiatal hernia, otherwise normal stomach, D1, D2  . EYE SURGERY Right 2005  . EYE SURGERY Left 2006  . ICD LEAD REMOVAL Left 02/08/2015   Procedure: ICD LEAD REMOVAL;  Surgeon: Evans Lance, MD;  Location: [facility] OR;  Service: Cardiovascular;  Laterality: Left;  "Will plan extraction and insertion of a BiV PM"  **Dr. Roxan Hockey backing up case**  . IMPLANTABLE CARDIOVERTER DEFIBRILLATOR (ICD) GENERATOR CHANGE Left 02/08/2015   Procedure: ICD GENERATOR CHANGE;  Surgeon: Evans Lance, MD;  Location: [place];  Service: Cardiovascular;  Laterality: Left;  . INSERT /  REPLACE / REMOVE PACEMAKER    . PACEMAKER INSERTION  May 2016  . pacemaker placed  2004  . right breast cyst removed     benign   . right cataract removed     2005  . TEE WITHOUT CARDIOVERSION N/A 11/13/2016   Procedure: TRANSESOPHAGEAL ECHOCARDIOGRAM (TEE);  Surgeon: Sanda Klein, MD;  Location: [facility] ENDOSCOPY;  Service: Cardiovascular;  Laterality: N/A;  . THYROIDECTOMY      Family History Family History  Problem Relation Age of Onset  . Pancreatic cancer Mother   . Heart disease Father   . Heart disease Sister   . Heart attack Sister   . Heart disease Brother   . Diabetes Brother   . Heart disease Brother   . Diabetes Brother   . Hypertension Brother   . Lung cancer Brother      Social History  reports that she quit smoking about 31 years ago.  Her smoking use included cigarettes. She has a 30.00 pack-year smoking history. She has never used smokeless tobacco. She reports that she does not drink alcohol or use drugs.  Medications  Current Outpatient Medications:  .  acetaminophen (TYLENOL) 500 MG tablet, Take 500 mg by mouth every 6 (six) hours as needed (pain)., Disp: , Rfl:  .  albuterol (PROVENTIL HFA;VENTOLIN HFA) 108 (90 Base) MCG/ACT inhaler, INHALE 2 PUFFS INTO THE LUNGS EVERY 6 HOURS AS NEEDED FOR WHEEZING OR SHORTNESS OF BREATH, Disp: 54 g, Rfl: 2 .  albuterol (PROVENTIL) (2.5 MG/3ML) 0.083% nebulizer solution, INHALE 1 VIAL VIA NEBULIZER THREE TIMES DAILY., Disp: 90 vial, Rfl: 5 .  allopurinol (ZYLOPRIM) 100 MG tablet, Take 1 tablet (100 mg total) by mouth daily., Disp: 90 tablet, Rfl: 1 .  BREO ELLIPTA 100-25 MCG/INH AEPB, Inhale 1 puff into the lungs daily. , Disp: , Rfl: 1 .  calcium carbonate (OS-CAL) 1250 (500 Ca) MG chewable tablet, Chew 1 tablet by mouth daily., Disp: , Rfl:  .  COUMADIN 5 MG tablet, Take 2 tablets daily except 1 tablet on Tuesdays and Saturdays, Disp: 180 tablet, Rfl: 3 .  docusate sodium (COLACE) 100 MG capsule, Take 300 mg by mouth at bedtime., Disp: , Rfl:  .  fluticasone (FLONASE) 50 MCG/ACT nasal spray, Place 2 sprays into both nostrils daily. Pt needs now, Disp: 48 g, Rfl: 1 .  folic acid (FOLVITE) 1 MG tablet, TAKE 1 TABLET BY MOUTH ONCE DAILY, Disp: 100 tablet, Rfl: 0 .  furosemide (LASIX) 40 MG tablet, Take 1 tablet (40 mg total) by mouth daily., Disp: 90 tablet, Rfl: 1 .  gabapentin (NEURONTIN) 300 MG capsule, TAKE 1 CAPSULE BY MOUTH AT BEDTIME, Disp: 90 capsule, Rfl: 2 .  guaiFENesin (MUCINEX) 600 MG 12 hr tablet, Take 1 tablet (600 mg total) by mouth 2 (two) times daily., Disp: 60 tablet, Rfl: 2 .  hydrALAZINE (APRESOLINE) 25 MG tablet, TAKE 1 TABLET THREE TIMES A DAY, Disp: 270 tablet, Rfl: 0 .  HYDROcodone-acetaminophen (NORCO) 7.5-325 MG tablet, Take 1 tablet by mouth every 6 (six) hours  as needed for moderate pain (Must last 30 days.)., Disp: 60 tablet, Rfl: 0 .  isosorbide mononitrate (IMDUR) 60 MG 24 hr tablet, TAKE 1 TABLET BY MOUTH ONCE DAILY, Disp: 90 tablet, Rfl: 0 .  meclizine (ANTIVERT) 25 MG tablet, Take 1 tablet (25 mg total) by mouth 3 (three) times daily as needed for dizziness., Disp: 30 tablet, Rfl: 0 .  metolazone (ZAROXOLYN) 2.5 MG tablet, Take 1 tablet (2.5 mg total) by  mouth 2 (two) times a week. Wednesdays and Saturdays, Disp: 30 tablet, Rfl: 3 .  metoprolol succinate (TOPROL-XL) 100 MG 24 hr tablet, TAKE 1 TABLET BY MOUTH DAILY. TAKE WITH OR IMMEDIATELY FOLLOWING A MEAL., Disp: 90 tablet, Rfl: 2 .  Multiple Vitamin (MULTIVITAMIN) LIQD, Take 5 mLs by mouth daily., Disp: , Rfl:  .  ondansetron (ZOFRAN) 4 MG tablet, Take 1 tablet (4 mg total) by mouth daily as needed for nausea or vomiting., Disp: 90 tablet, Rfl: 1 .  Polyethyl [name] (SYSTANE OP), Place 1 drop into both eyes daily as needed (dry eyes)., Disp: , Rfl:  .  potassium chloride SA (KLOR-CON M20) 20 MEQ tablet, Take 1 tablet by mouth  daily, Disp: 90 tablet, Rfl: 1 .  pravastatin (PRAVACHOL) 40 MG tablet, TAKE 1 TABLET BY MOUTH EVERY EVENING, Disp: 90 tablet, Rfl: 1 .  SYNTHROID 75 MCG tablet, TAKE 1 TABLET DAILY, Disp: 90 tablet, Rfl: 1 No current facility-administered medications for this visit.   Facility-Administered Medications Ordered in Other Visits:  .  epoetin alfa (EPOGEN,PROCRIT) injection 40,000 Units, 40,000 Units, Subcutaneous, Once, Farrel Gobble, MD  Allergies Penicillins; Aspirin; Fentanyl; and Niacin  Review of Systems Review of Systems - Oncology ROS negative   Physical Exam  Vitals Wt Readings from Last 3 Encounters:  08/15/18 192 lb (87.1 kg)  08/02/18 201 lb (91.2 kg)  07/30/18 189 lb (85.7 kg)   Temp Readings from Last 3 Encounters:  08/29/18 97.6 F (36.4 C) (Oral)  07/30/18 98.7 F (37.1 C) (Oral)  07/18/18 99 F (37.2 C) (Oral)   BP  Readings from Last 3 Encounters:  08/29/18 (!) 104/55  08/02/18 (!) 128/58  07/30/18 (!) 156/80   Pulse Readings from Last 3 Encounters:  08/29/18 69  08/02/18 69  07/30/18 71   Constitutional: Well-developed, well-nourished, and in no distress.  Seated in wheelchair.   HENT: Head: Normocephalic and atraumatic.  Mouth/Throat: No oropharyngeal exudate. Mucosa moist. Eyes: Pupils are equal, round, and reactive to light. Conjunctivae are normal. No scleral icterus.  Neck: Normal range of motion. Neck supple. No JVD present.  Cardiovascular: Normal rate, regular rhythm and normal heart sounds.  Exam reveals no gallop and no friction rub.   No murmur heard. Pulmonary/Chest: Effort normal and breath sounds normal. No respiratory distress. No wheezes.No rales.  Abdominal: Soft. Bowel sounds are normal. No distension. There is no tenderness. There is no guarding.  Musculoskeletal: No edema or tenderness.  Lymphadenopathy: No cervical, axillary or supraclavicular adenopathy.  Neurological: Alert and oriented to person, place, and time. No cranial nerve deficit.  Skin: Skin is warm and dry. No rash noted. No erythema. No pallor.  Psychiatric: Affect and judgment normal.   Labs Appointment on 08/29/2018  Component Date Value Ref Range Status  . WBC 08/29/2018 5.2  4.0 - 10.5 K/uL Final  . RBC 08/29/2018 3.19* 3.87 - 5.11 MIL/uL Final  . Hemoglobin 08/29/2018 9.2* 12.0 - 15.0 g/dL Final  . HCT 08/29/2018 29.7* 36.0 - 46.0 % Final  . MCV 08/29/2018 93.1  80.0 - 100.0 fL Final  . MCH 08/29/2018 28.8  26.0 - 34.0 pg Final  . MCHC 08/29/2018 31.0  30.0 - 36.0 g/dL Final  . RDW 08/29/2018 16.1* 11.5 - 15.5 % Final  . Platelets 08/29/2018 157  150 - 400 K/uL Final  . nRBC 08/29/2018 0.0  0.0 - 0.2 % Final  . Neutrophils Relative % 08/29/2018 73  % Final  . Neutro Abs 08/29/2018 3.8  1.7 - 7.7 K/uL Final  . Lymphocytes Relative 08/29/2018 18  % Final  . Lymphs Abs 08/29/2018 0.9  0.7 - 4.0 K/uL  Final  . Monocytes Relative 08/29/2018 9  % Final  . Monocytes Absolute 08/29/2018 0.5  0.1 - 1.0 K/uL Final  . Eosinophils Relative 08/29/2018 0  % Final  . Eosinophils Absolute 08/29/2018 0.0  0.0 - 0.5 K/uL Final  . Basophils Relative 08/29/2018 0  % Final  . Basophils Absolute 08/29/2018 0.0  0.0 - 0.1 K/uL Final  . Immature Granulocytes 08/29/2018 0  % Final  . Abs Immature Granulocytes 08/29/2018 0.01  0.00 - 0.07 K/uL Final   Performed at [facility], [address], [place], [place] [address]  . Sodium 08/29/2018 134* 135 - 145 mmol/L Final  . Potassium 08/29/2018 4.2  3.5 - 5.1 mmol/L Final  . Chloride 08/29/2018 97* 98 - 111 mmol/L Final  . CO2 08/29/2018 29  22 - 32 mmol/L Final  . Glucose, Bld 08/29/2018 109* 70 - 99 mg/dL Final  . BUN 08/29/2018 49* 8 - 23 mg/dL Final  . Creatinine, Ser 08/29/2018 1.85* 0.44 - 1.00 mg/dL Final  . Calcium 08/29/2018 9.4  8.9 - 10.3 mg/dL Final  . Total Protein 08/29/2018 8.0  6.5 - 8.1 g/dL Final  . Albumin 08/29/2018 3.9  3.5 - 5.0 g/dL Final  . AST 08/29/2018 29  15 - 41 U/L Final  . ALT 08/29/2018 21  0 - 44 U/L Final  . Alkaline Phosphatase 08/29/2018 53  38 - 126 U/L Final  . Total Bilirubin 08/29/2018 0.5  0.3 - 1.2 mg/dL Final  . GFR calc non Af Amer 08/29/2018 25* >60 mL/min Final  . GFR calc Af Amer 08/29/2018 28* >60 mL/min Final  . Anion gap 08/29/2018 8  5 - 15 Final   Performed at [facility], [address] [place], [place] [address]  . LDH 08/29/2018 185  98 - 192 U/L Final   Performed at [facility], [address] [place], [place] [address]  . Ferritin 08/29/2018 661* 11 - 307 ng/mL Final   Performed at [facility], [address], [place], [place] [address]     Pathology Orders Placed This Encounter  Procedures  . CBC with Differential/Platelet    Standing Status:   Standing    Number of Occurrences:   12    Standing Expiration Date:   08/30/2019  . CBC with Differential/Platelet    Standing Status:   Future     Standing Expiration Date:   08/29/2020  . Comprehensive metabolic panel    Standing Status:   Future    Standing Expiration Date:   08/29/2020  . Lactate dehydrogenase    Standing Status:   Future    Standing Expiration Date:   08/29/2020  . Ferritin    Standing Status:   Future    Standing Expiration Date:   08/29/2020  . Protein electrophoresis, serum    Standing Status:   Future    Standing Expiration Date:   08/29/2020  . Vitamin B12    Standing Status:   Future    Standing Expiration Date:   08/29/2020  . Folate    Standing Status:   Future    Standing Expiration Date:   08/29/2020       Zoila Shutter MD

## 2018-08-30 NOTE — Telephone Encounter (Signed)
Spoke with patient and she got the patient and is very [employer] attorney.

## 2018-09-03 LAB — PROTEIN ELECTROPHORESIS, SERUM
A/G Ratio: 1 (ref 0.7–1.7)
Albumin ELP: 3.7 g/dL (ref 2.9–4.4)
Alpha-1-Globulin: 0.3 g/dL (ref 0.0–0.4)
Alpha-2-Globulin: 0.6 g/dL (ref 0.4–1.0)
Beta Globulin: 1.1 g/dL (ref 0.7–1.3)
Gamma Globulin: 1.7 g/dL (ref 0.4–1.8)
Globulin, Total: 3.7 g/dL (ref 2.2–3.9)
Total Protein ELP: 7.4 g/dL (ref 6.0–8.5)

## 2018-09-04 ENCOUNTER — Telehealth: Payer: Self-pay | Admitting: *Deleted

## 2018-09-04 NOTE — Telephone Encounter (Signed)
Pt called wanting to speak with someone about her potassium chloride medication. She stated she normally gets it through [place], but she doesn't want to go with [employer] anymore. She has enough for 5 days but would like a prescription called into [facility].

## 2018-09-05 ENCOUNTER — Other Ambulatory Visit: Payer: Self-pay

## 2018-09-05 DIAGNOSIS — N184 Chronic kidney disease, stage 4 (severe): Secondary | ICD-10-CM

## 2018-09-05 MED ORDER — POTASSIUM CHLORIDE CRYS ER 20 MEQ PO TBCR: EXTENDED_RELEASE_TABLET | ORAL | 1 refills | Status: DC

## 2018-09-05 NOTE — Progress Notes (Signed)
Potassium sent to [facility]

## 2018-09-08 IMAGING — DX DG CHEST 2V
2 series · 2 of 2 positions shown · non-contrast
Comparison: 08/31/2017

CLINICAL DATA: Cough

EXAM:
CHEST  2 VIEW

[chest pa]
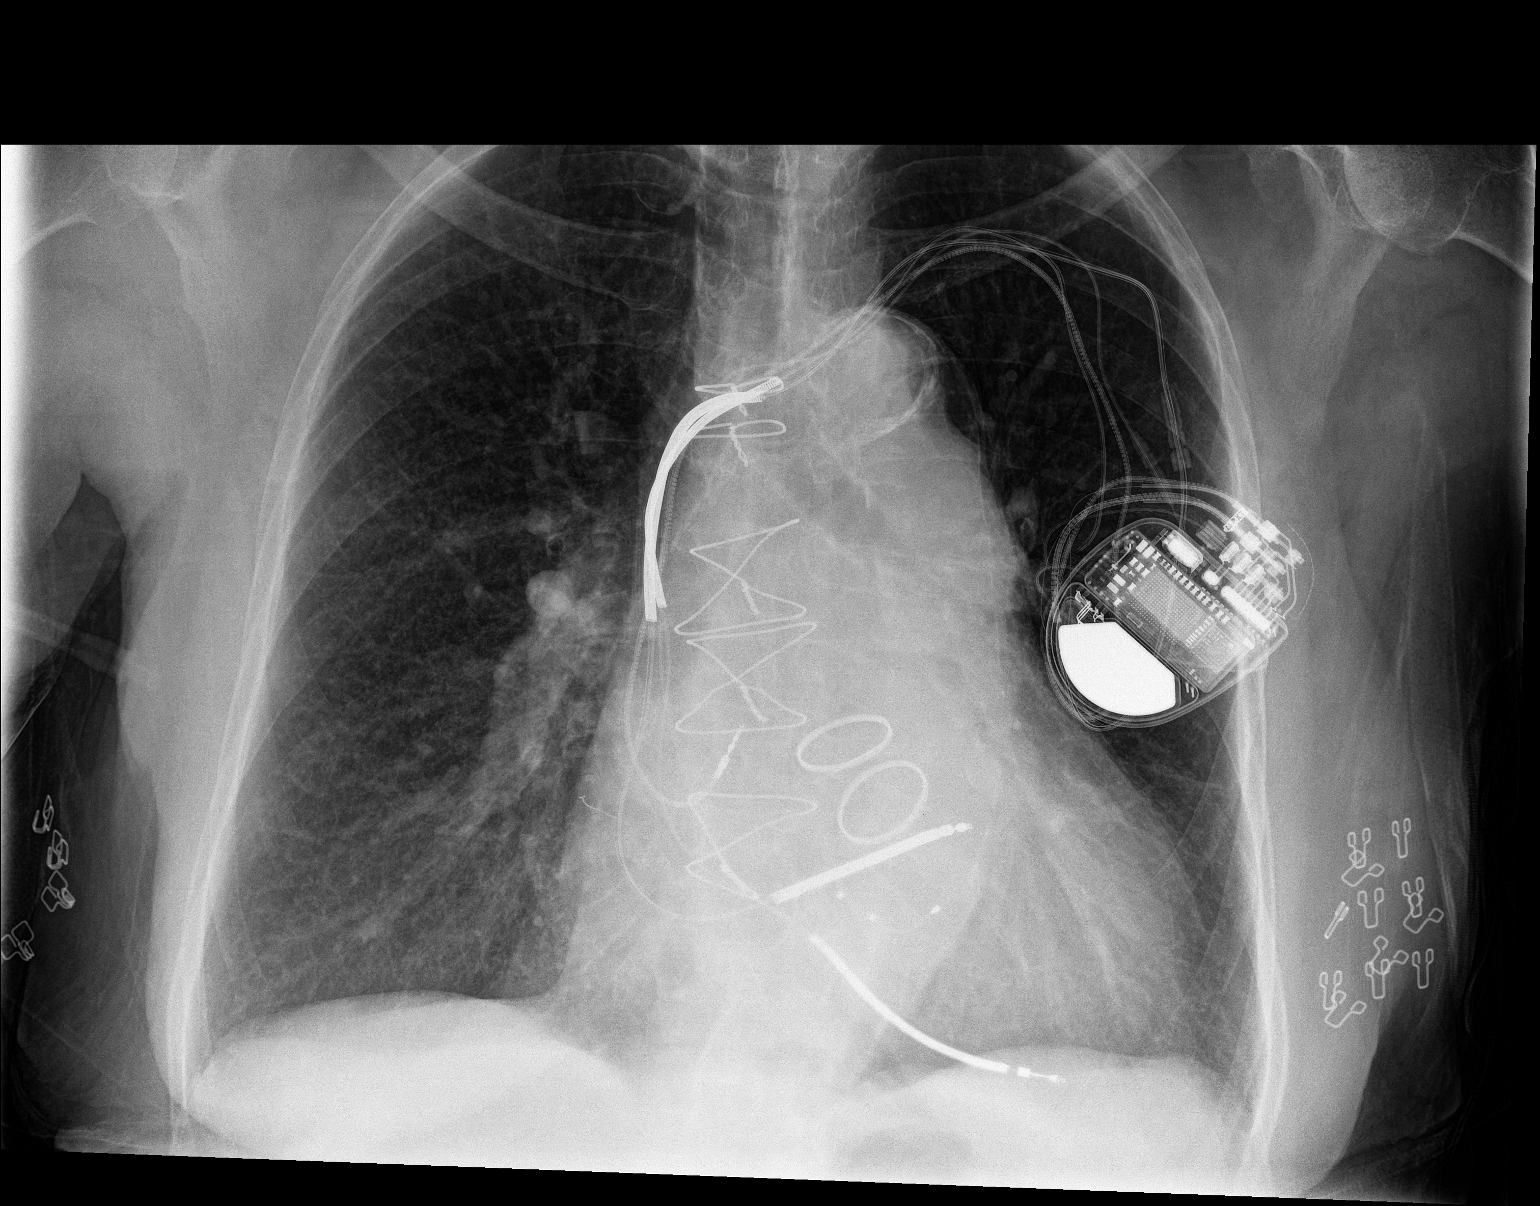

[chest lat]
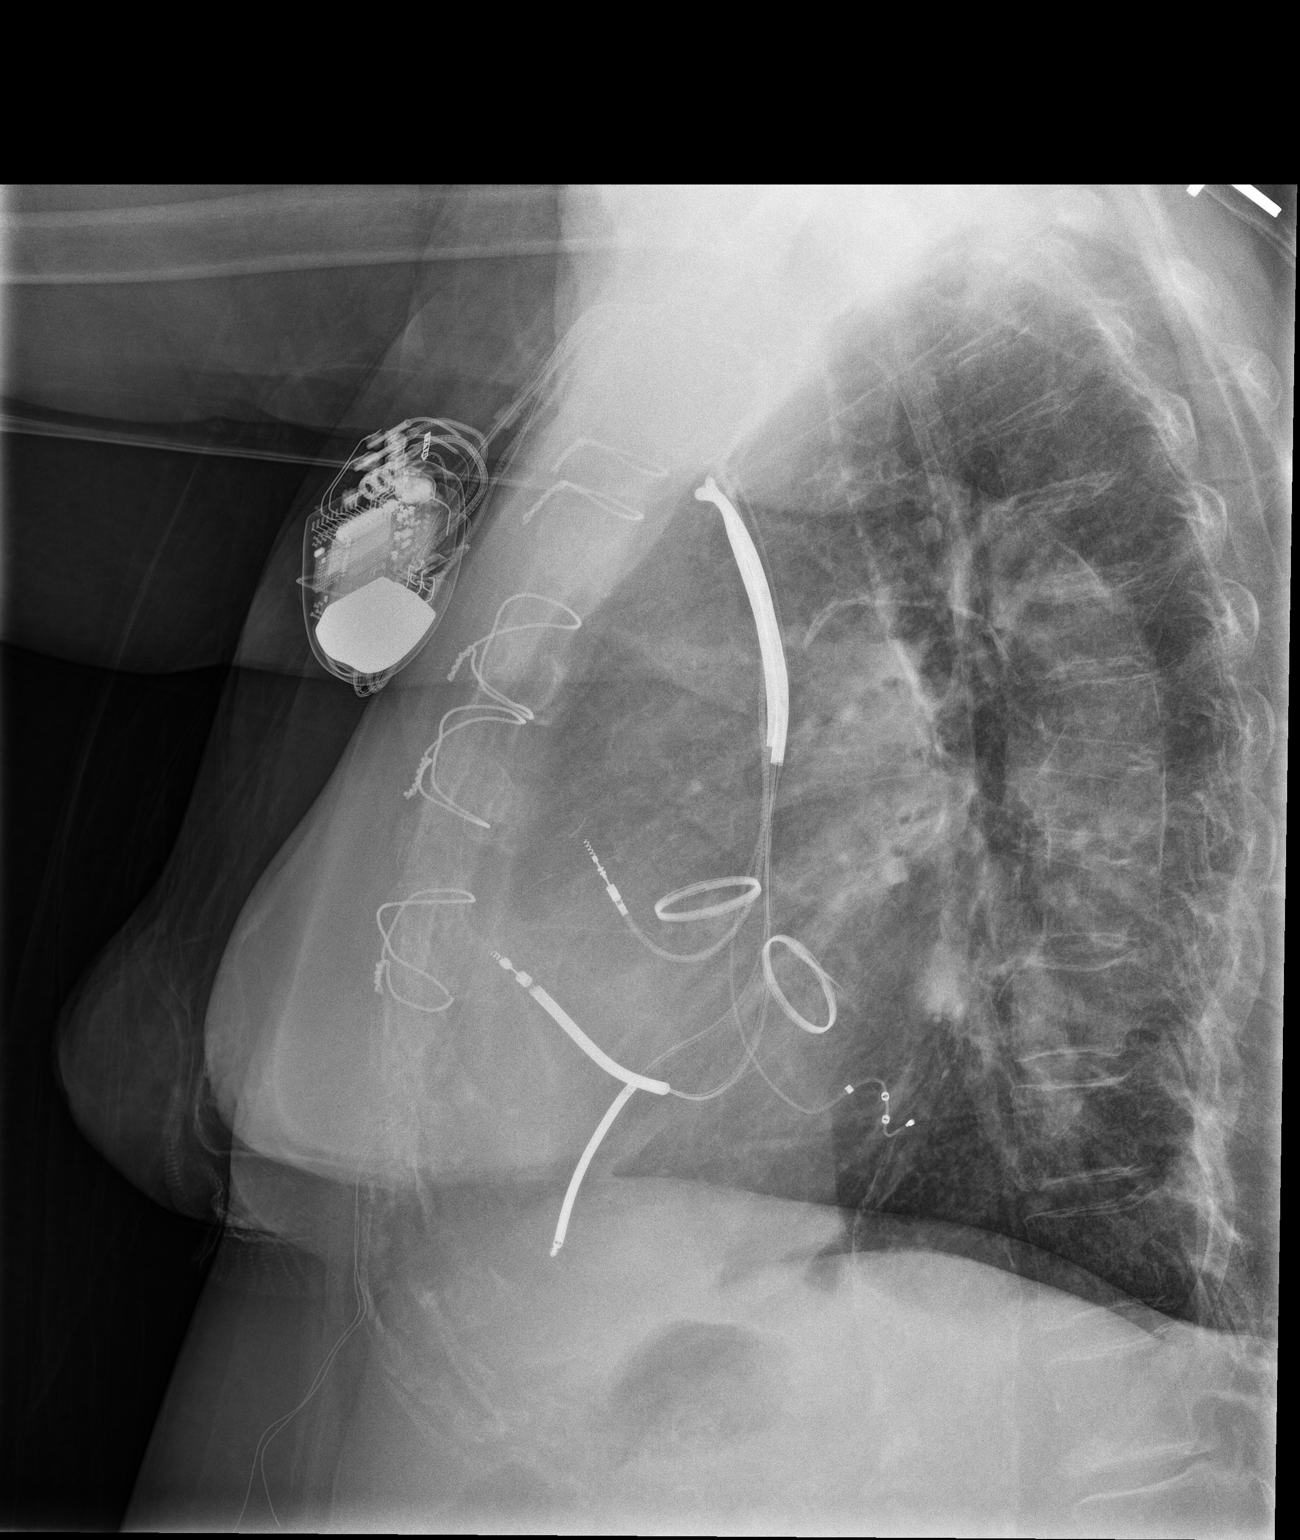

[2 of 2 positions shown; findings below may reference images not displayed]

FINDINGS: Cardiac enlargement without heart failure. Aortic and mitral valve
replacement unchanged in position. Internal cardiac defibrillator
leads unchanged from the prior study.

Lungs are clear without infiltrate effusion or mass. Atherosclerotic
aorta.
IMPRESSION: No active cardiopulmonary disease.

## 2018-09-09 ENCOUNTER — Ambulatory Visit (INDEPENDENT_AMBULATORY_CARE_PROVIDER_SITE_OTHER): Payer: Medicare Other | Admitting: *Deleted

## 2018-09-09 DIAGNOSIS — Z952 Presence of prosthetic heart valve: Secondary | ICD-10-CM

## 2018-09-09 DIAGNOSIS — Z5181 Encounter for therapeutic drug level monitoring: Secondary | ICD-10-CM | POA: Diagnosis not present

## 2018-09-09 LAB — POCT INR: INR: 2.3 (ref 2.0–3.0)

## 2018-09-09 IMAGING — DX DG CHEST 2V
2 series · 3 of 3 positions shown · non-contrast
Comparison: 10/31/2017

CLINICAL DATA: Shortness of breath, in the emergency department
yesterday for the flu, began wheezing and feeling worse today,
history of heart block, CHF, COPD, diabetes mellitus, hypertension,
non ischemic cardiomyopathy, former smoker

EXAM:
CHEST  2 VIEW

[chest lat]
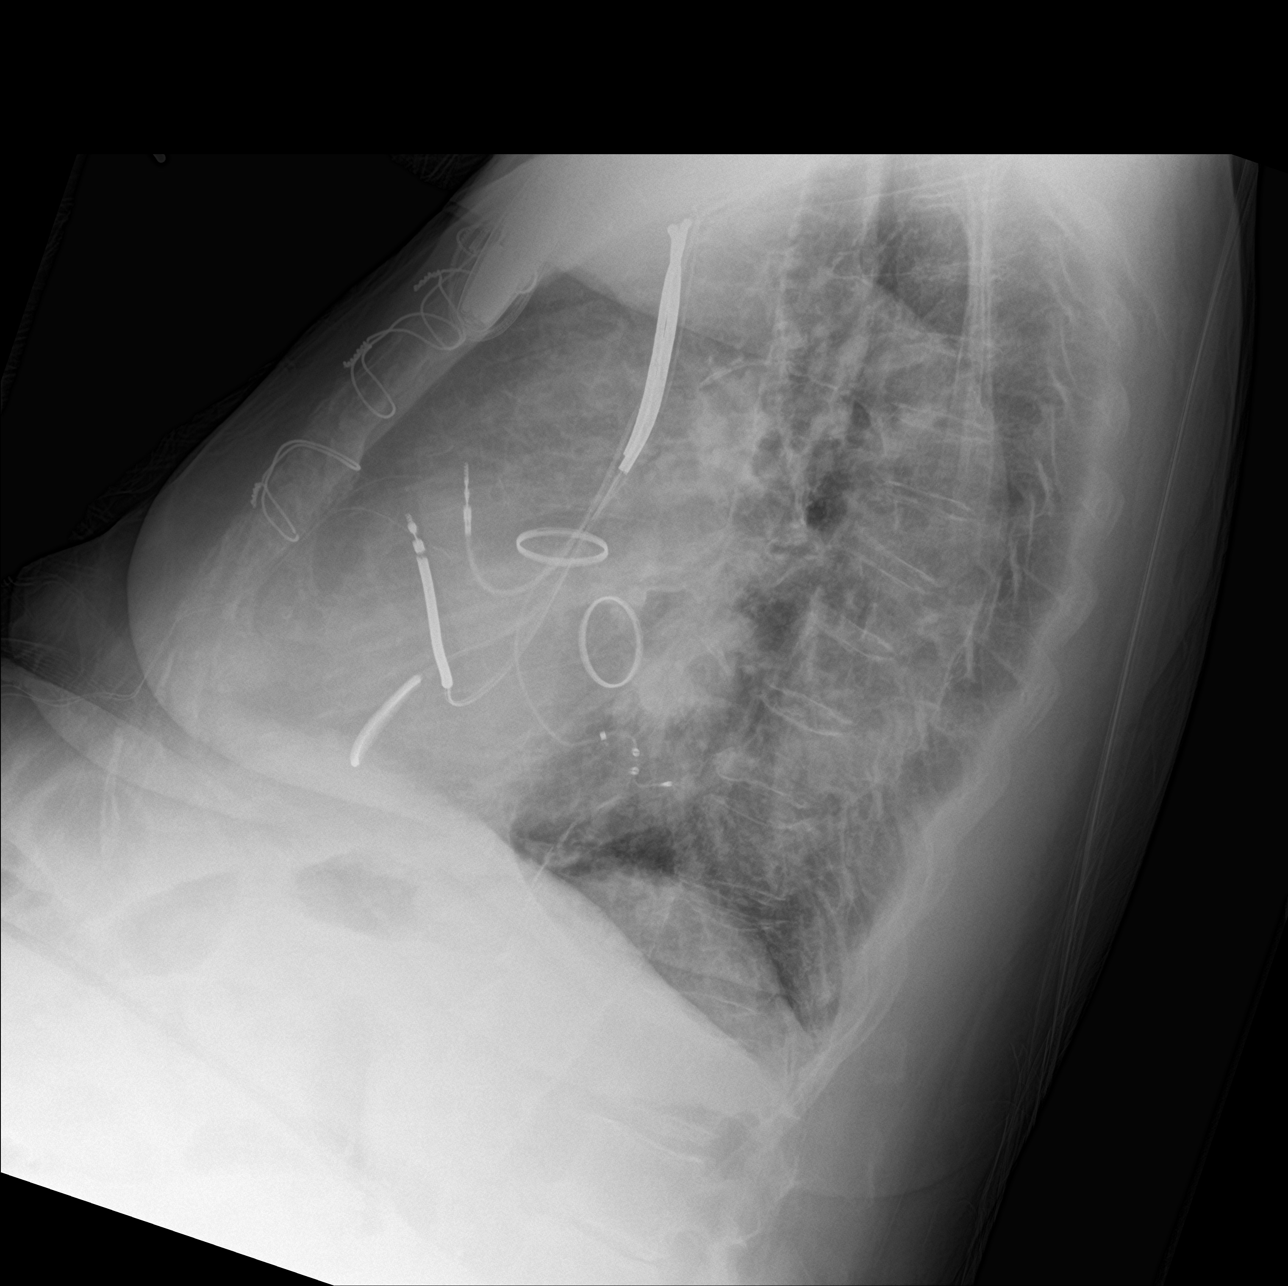

[Series 3: chest ap · 0.14mm/px · 2 of 2 slices shown]
[im 1/2]
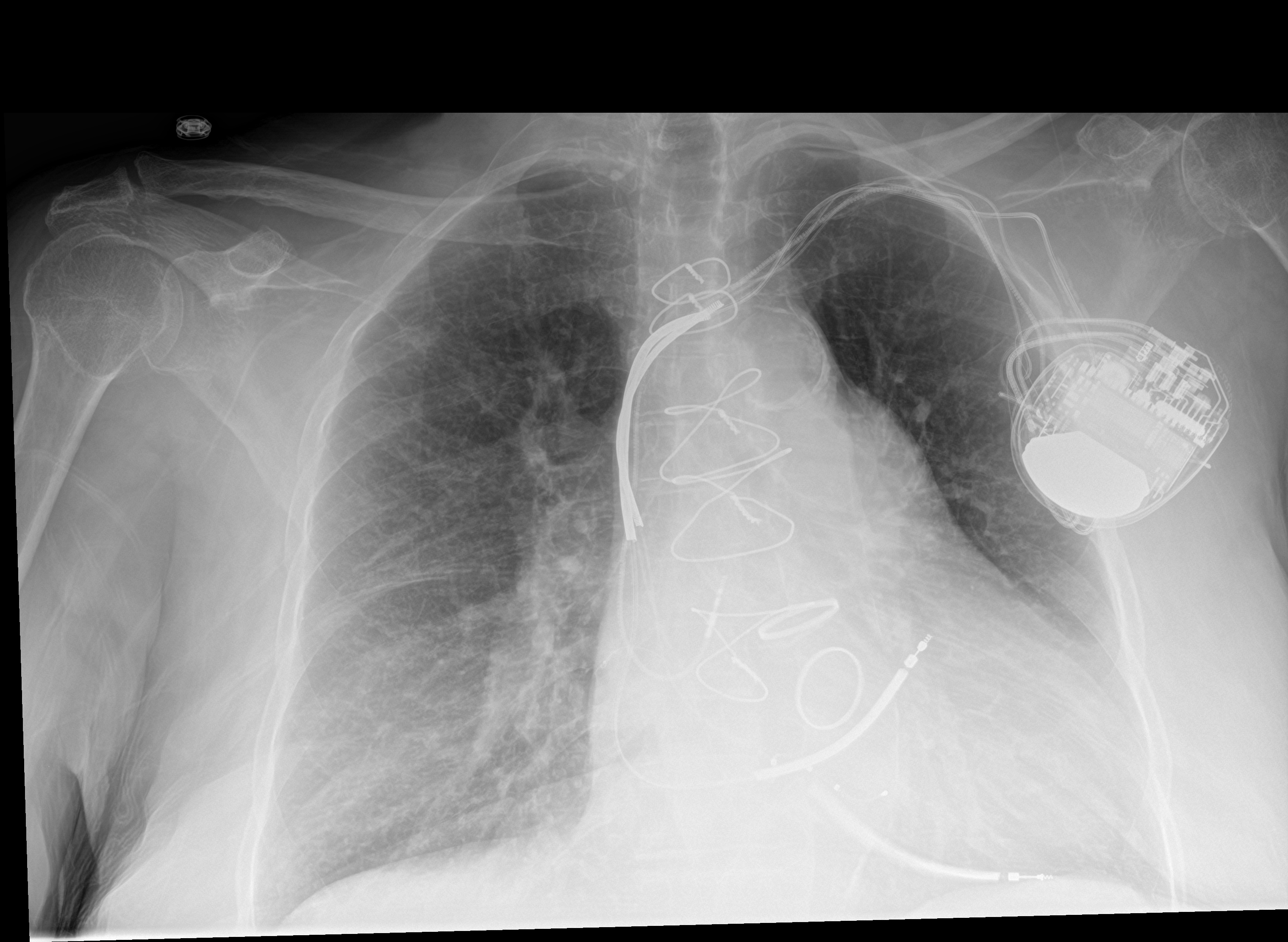
[im 2/2]
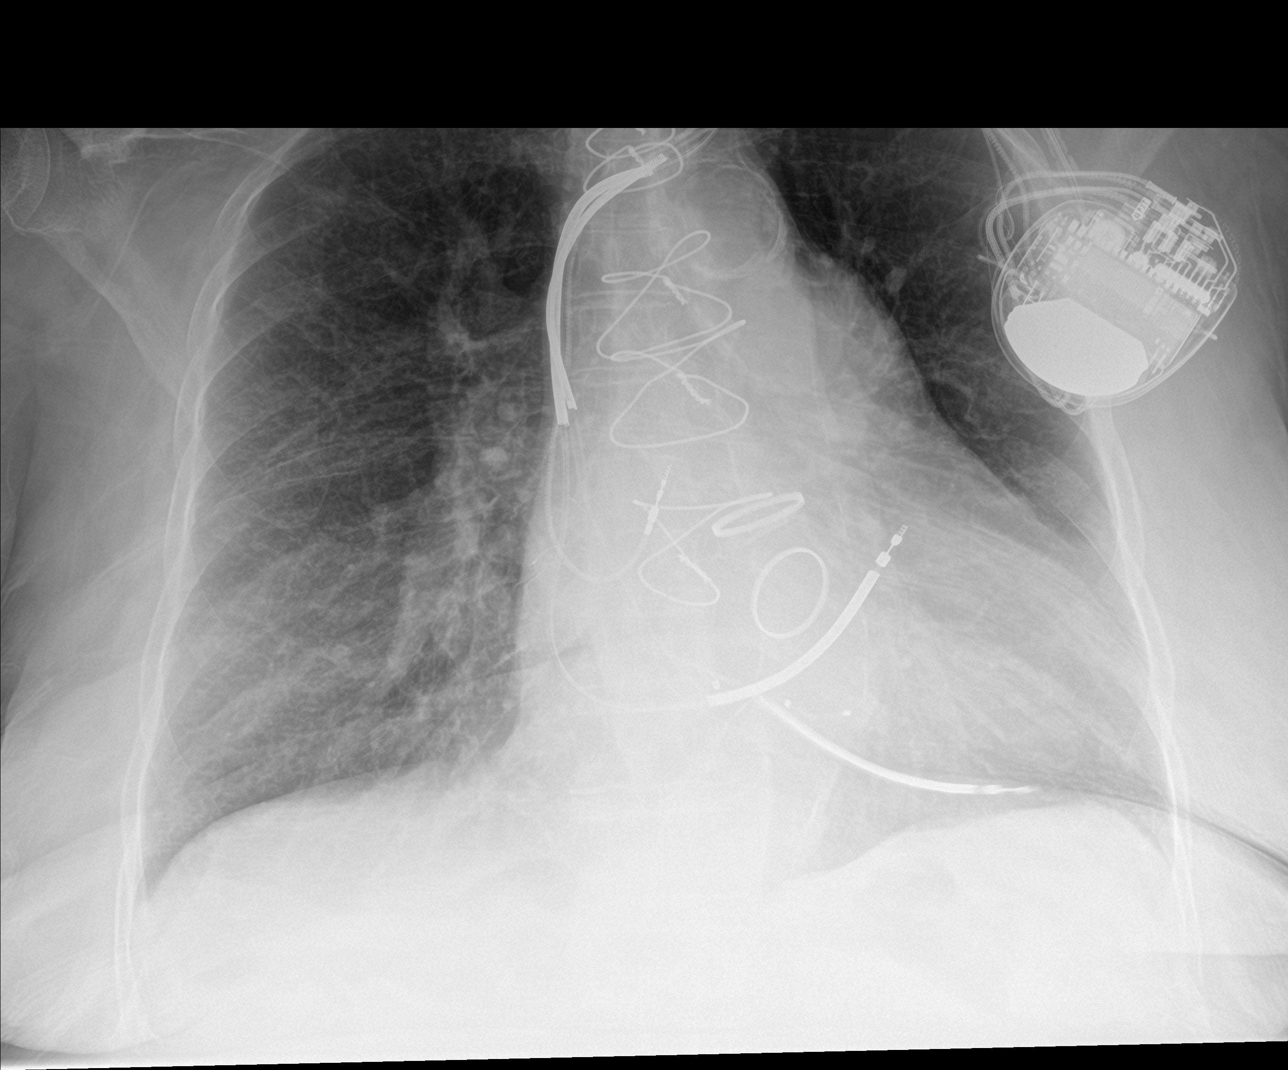

[3 of 3 positions shown; findings below may reference images not displayed]

FINDINGS: LEFT subclavian AICD leads appear stable.

Enlargement of cardiac silhouette with minimal pulmonary vascular
congestion.

Atherosclerotic calcifications aorta.

Emphysematous and bronchitic changes consistent with COPD.

Interstitial prominence in both lungs slightly greater on RIGHT,
increased since previous study question mild pulmonary edema.

No segmental consolidation, pleural effusion, or pneumothorax.

Bones demineralized.
IMPRESSION: Suspected mild pulmonary edema.

## 2018-09-09 NOTE — Patient Instructions (Signed)
Continue coumadin 2 tablets daily except 1 tablet on Tuesdays and Saturdays.  Continue greens  Recheck in 4 weeks.

## 2018-09-21 ENCOUNTER — Other Ambulatory Visit: Payer: Self-pay | Admitting: Family Medicine

## 2018-09-23 IMAGING — DX DG CHEST 2V
2 series · 2 of 2 positions shown · non-contrast
Comparison: PA and lateral chest 11/01/2017, 10/31/2017, 10/09/2016
and 03/20/2016. CT chest 11/16/2016.

CLINICAL DATA: Productive cough and shortness of breath for 2
weeks.

EXAM:
CHEST  2 VIEW

[chest pa]
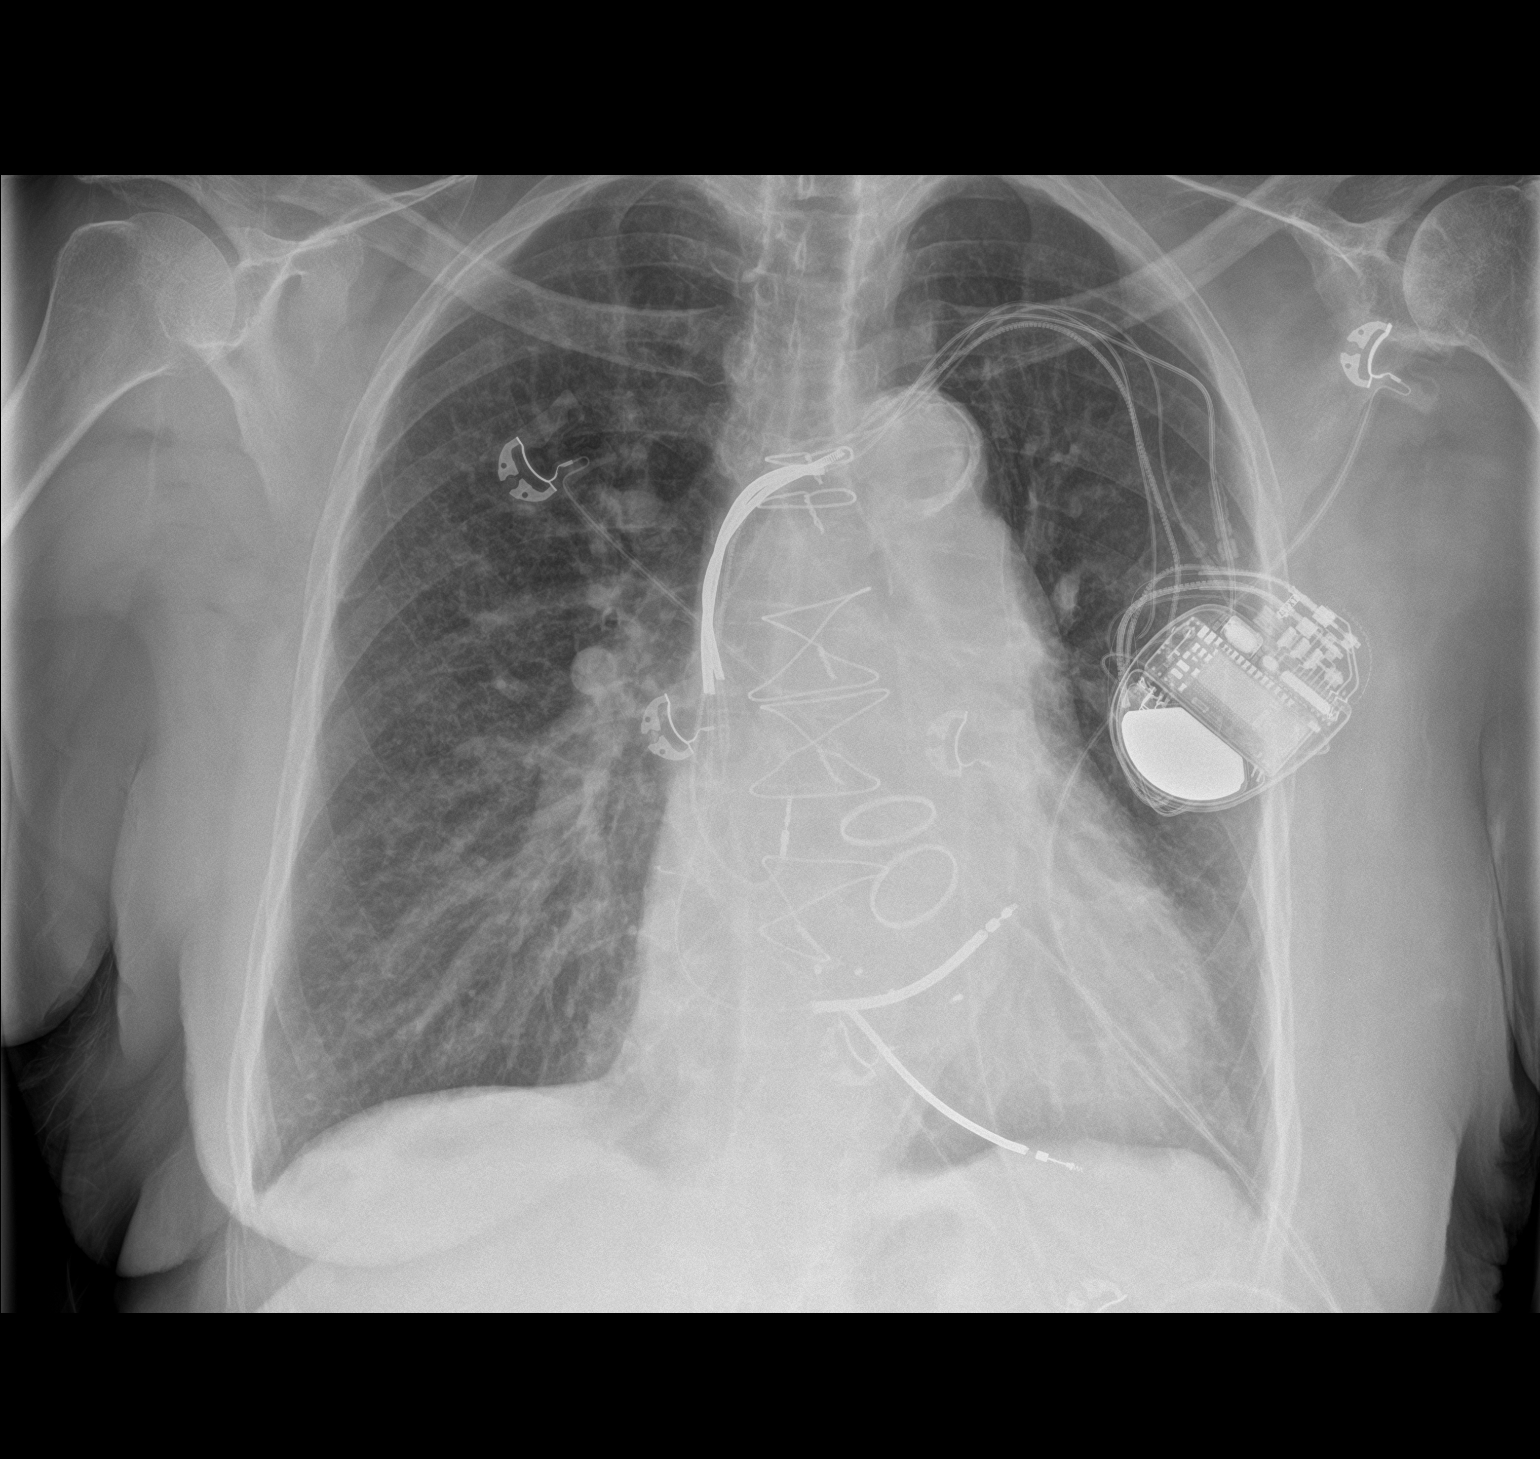

[chest lat]
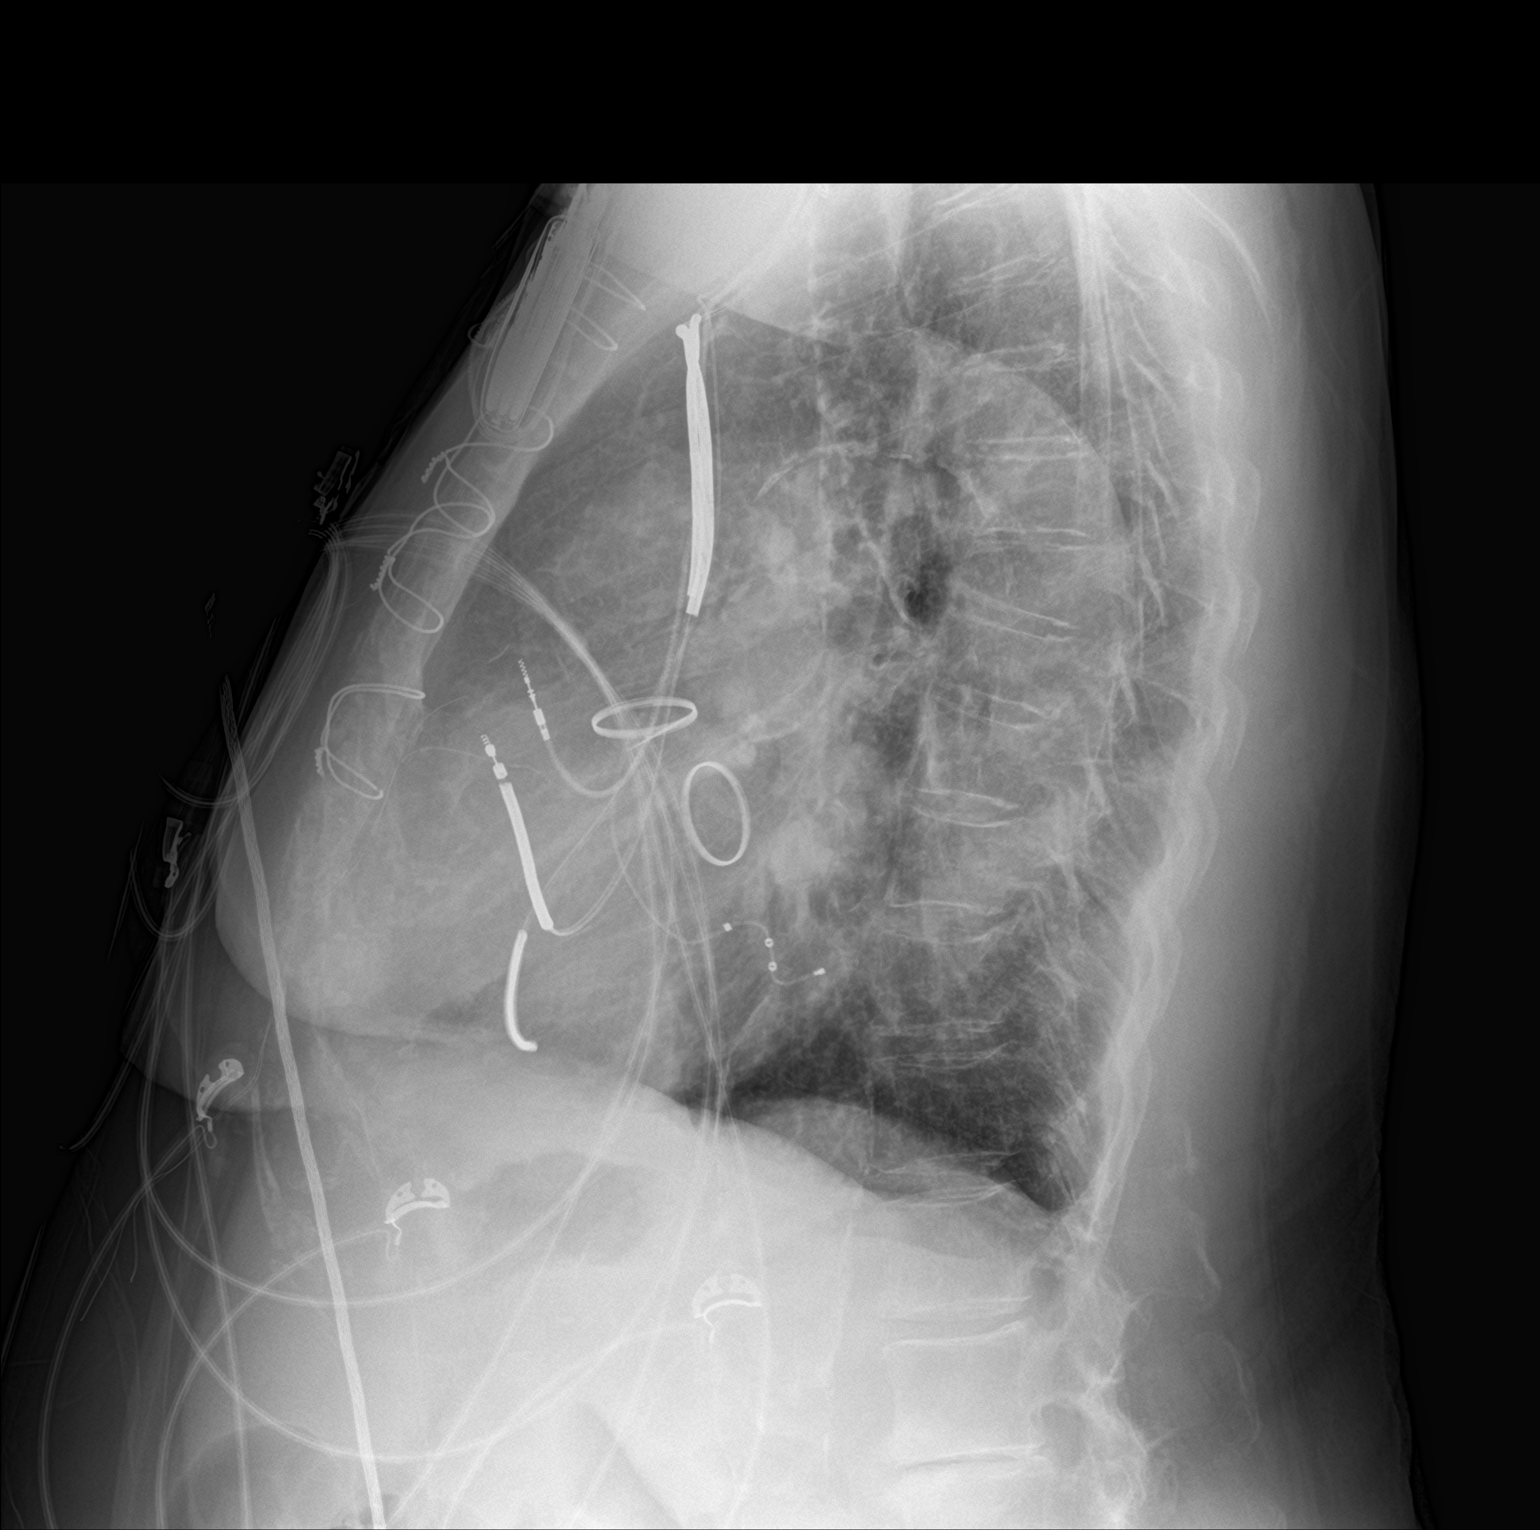

[2 of 2 positions shown; findings below may reference images not displayed]

FINDINGS: The chest is hyperexpanded. There is cardiomegaly and pulmonary
vascular congestion which appear unchanged. No consolidative
process, pneumothorax or effusion. AICD is in place. The patient is
status post aortic and mitral valve replacement. No acute bony
abnormality.
IMPRESSION: No acute disease.

Cardiomegaly and chronic pulmonary vascular congestion.

COPD.

Atherosclerosis.

## 2018-09-26 ENCOUNTER — Observation Stay (HOSPITAL_COMMUNITY)
Admission: EM | Admit: 2018-09-26 | Discharge: 2018-09-27 | Disposition: A | Discharge status: Home / Self Care | Payer: Medicare Other | Attending: Internal Medicine | Admitting: Internal Medicine

## 2018-09-26 ENCOUNTER — Inpatient Hospital Stay (HOSPITAL_COMMUNITY): Payer: Medicare Other | Attending: Hematology

## 2018-09-26 ENCOUNTER — Other Ambulatory Visit: Payer: Self-pay

## 2018-09-26 ENCOUNTER — Ambulatory Visit (INDEPENDENT_AMBULATORY_CARE_PROVIDER_SITE_OTHER): Payer: Medicare Other

## 2018-09-26 ENCOUNTER — Inpatient Hospital Stay (HOSPITAL_COMMUNITY): Payer: Medicare Other

## 2018-09-26 ENCOUNTER — Emergency Department (HOSPITAL_COMMUNITY): Payer: Medicare Other

## 2018-09-26 ENCOUNTER — Encounter (HOSPITAL_COMMUNITY): Payer: Self-pay | Admitting: Emergency Medicine

## 2018-09-26 VITALS — BP 115/65 | HR 70 | Resp 18

## 2018-09-26 DIAGNOSIS — Z95810 Presence of automatic (implantable) cardiac defibrillator: Secondary | ICD-10-CM

## 2018-09-26 DIAGNOSIS — N181 Chronic kidney disease, stage 1: Secondary | ICD-10-CM | POA: Insufficient documentation

## 2018-09-26 DIAGNOSIS — I493 Ventricular premature depolarization: Secondary | ICD-10-CM | POA: Insufficient documentation

## 2018-09-26 DIAGNOSIS — R112 Nausea with vomiting, unspecified: Secondary | ICD-10-CM | POA: Diagnosis not present

## 2018-09-26 DIAGNOSIS — I428 Other cardiomyopathies: Secondary | ICD-10-CM | POA: Insufficient documentation

## 2018-09-26 DIAGNOSIS — Z888 Allergy status to other drugs, medicaments and biological substances status: Secondary | ICD-10-CM | POA: Insufficient documentation

## 2018-09-26 DIAGNOSIS — I5032 Chronic diastolic (congestive) heart failure: Secondary | ICD-10-CM | POA: Insufficient documentation

## 2018-09-26 DIAGNOSIS — J209 Acute bronchitis, unspecified: Secondary | ICD-10-CM | POA: Diagnosis present

## 2018-09-26 DIAGNOSIS — Z7901 Long term (current) use of anticoagulants: Secondary | ICD-10-CM | POA: Insufficient documentation

## 2018-09-26 DIAGNOSIS — Z87891 Personal history of nicotine dependence: Secondary | ICD-10-CM | POA: Insufficient documentation

## 2018-09-26 DIAGNOSIS — D509 Iron deficiency anemia, unspecified: Secondary | ICD-10-CM | POA: Diagnosis not present

## 2018-09-26 DIAGNOSIS — E039 Hypothyroidism, unspecified: Secondary | ICD-10-CM | POA: Insufficient documentation

## 2018-09-26 DIAGNOSIS — M109 Gout, unspecified: Secondary | ICD-10-CM | POA: Insufficient documentation

## 2018-09-26 DIAGNOSIS — H409 Unspecified glaucoma: Secondary | ICD-10-CM | POA: Insufficient documentation

## 2018-09-26 DIAGNOSIS — E871 Hypo-osmolality and hyponatremia: Secondary | ICD-10-CM | POA: Diagnosis not present

## 2018-09-26 DIAGNOSIS — E785 Hyperlipidemia, unspecified: Secondary | ICD-10-CM | POA: Diagnosis not present

## 2018-09-26 DIAGNOSIS — K219 Gastro-esophageal reflux disease without esophagitis: Secondary | ICD-10-CM | POA: Insufficient documentation

## 2018-09-26 DIAGNOSIS — D649 Anemia, unspecified: Secondary | ICD-10-CM

## 2018-09-26 DIAGNOSIS — R918 Other nonspecific abnormal finding of lung field: Secondary | ICD-10-CM | POA: Diagnosis not present

## 2018-09-26 DIAGNOSIS — E860 Dehydration: Secondary | ICD-10-CM | POA: Insufficient documentation

## 2018-09-26 DIAGNOSIS — Z833 Family history of diabetes mellitus: Secondary | ICD-10-CM | POA: Diagnosis not present

## 2018-09-26 DIAGNOSIS — Z8249 Family history of ischemic heart disease and other diseases of the circulatory system: Secondary | ICD-10-CM | POA: Insufficient documentation

## 2018-09-26 DIAGNOSIS — R9431 Abnormal electrocardiogram [ECG] [EKG]: Secondary | ICD-10-CM | POA: Diagnosis not present

## 2018-09-26 DIAGNOSIS — J449 Chronic obstructive pulmonary disease, unspecified: Secondary | ICD-10-CM | POA: Diagnosis not present

## 2018-09-26 DIAGNOSIS — I700 Atherosclerosis of aorta: Secondary | ICD-10-CM | POA: Insufficient documentation

## 2018-09-26 DIAGNOSIS — Z880 Allergy status to penicillin: Secondary | ICD-10-CM | POA: Insufficient documentation

## 2018-09-26 DIAGNOSIS — Z886 Allergy status to analgesic agent status: Secondary | ICD-10-CM | POA: Insufficient documentation

## 2018-09-26 DIAGNOSIS — Z79890 Hormone replacement therapy (postmenopausal): Secondary | ICD-10-CM | POA: Diagnosis not present

## 2018-09-26 DIAGNOSIS — Z79899 Other long term (current) drug therapy: Secondary | ICD-10-CM | POA: Insufficient documentation

## 2018-09-26 DIAGNOSIS — E1122 Type 2 diabetes mellitus with diabetic chronic kidney disease: Secondary | ICD-10-CM | POA: Diagnosis not present

## 2018-09-26 DIAGNOSIS — N184 Chronic kidney disease, stage 4 (severe): Secondary | ICD-10-CM | POA: Diagnosis not present

## 2018-09-26 DIAGNOSIS — I442 Atrioventricular block, complete: Secondary | ICD-10-CM

## 2018-09-26 DIAGNOSIS — D631 Anemia in chronic kidney disease: Secondary | ICD-10-CM | POA: Diagnosis not present

## 2018-09-26 DIAGNOSIS — I255 Ischemic cardiomyopathy: Secondary | ICD-10-CM

## 2018-09-26 DIAGNOSIS — R0602 Shortness of breath: Secondary | ICD-10-CM | POA: Diagnosis not present

## 2018-09-26 DIAGNOSIS — Z950 Presence of cardiac pacemaker: Secondary | ICD-10-CM | POA: Insufficient documentation

## 2018-09-26 DIAGNOSIS — Z952 Presence of prosthetic heart valve: Secondary | ICD-10-CM | POA: Insufficient documentation

## 2018-09-26 DIAGNOSIS — Z885 Allergy status to narcotic agent status: Secondary | ICD-10-CM | POA: Insufficient documentation

## 2018-09-26 LAB — CUP PACEART REMOTE DEVICE CHECK
Battery Remaining Longevity: 2 mo
Battery Voltage: 2.77 V
Brady Statistic AP VP Percent: 0 %
Brady Statistic AP VS Percent: 0 %
Brady Statistic AS VP Percent: 99.81 %
Brady Statistic AS VS Percent: 0.19 %
Brady Statistic RA Percent Paced: 0 %
Brady Statistic RV Percent Paced: 99.83 %
Date Time Interrogation Session: 20200102083523
HighPow Impedance: 36 Ohm
HighPow Impedance: 42 Ohm
Implantable Lead Implant Date: 20040521
Implantable Lead Implant Date: 20071218
Implantable Lead Implant Date: 20160516
Implantable Lead Location: 753858
Implantable Lead Location: 753859
Implantable Lead Location: 753860
Implantable Lead Model: 5076
Implantable Lead Model: 6947
Implantable Pulse Generator Implant Date: 20160516
Lead Channel Impedance Value: 304 Ohm
Lead Channel Impedance Value: 304 Ohm
Lead Channel Impedance Value: 361 Ohm
Lead Channel Impedance Value: 361 Ohm
Lead Channel Impedance Value: 399 Ohm
Lead Channel Impedance Value: 418 Ohm
Lead Channel Impedance Value: 456 Ohm
Lead Channel Impedance Value: 589 Ohm
Lead Channel Impedance Value: 608 Ohm
Lead Channel Impedance Value: 665 Ohm
Lead Channel Impedance Value: 703 Ohm
Lead Channel Impedance Value: 722 Ohm
Lead Channel Impedance Value: 760 Ohm
Lead Channel Pacing Threshold Amplitude: 0.5 V
Lead Channel Pacing Threshold Amplitude: 1.875 V
Lead Channel Pacing Threshold Amplitude: 5 V
Lead Channel Pacing Threshold Pulse Width: 0.4 ms
Lead Channel Pacing Threshold Pulse Width: 0.4 ms
Lead Channel Pacing Threshold Pulse Width: 1.5 ms
Lead Channel Sensing Intrinsic Amplitude: 1.875 mV
Lead Channel Sensing Intrinsic Amplitude: 1.875 mV
Lead Channel Sensing Intrinsic Amplitude: 10.5 mV
Lead Channel Sensing Intrinsic Amplitude: 10.5 mV
Lead Channel Setting Pacing Amplitude: 2 V
Lead Channel Setting Pacing Amplitude: 2.5 V
Lead Channel Setting Pacing Amplitude: 3 V
Lead Channel Setting Pacing Pulse Width: 0.6 ms
Lead Channel Setting Pacing Pulse Width: 1 ms
Lead Channel Setting Sensing Sensitivity: 0.3 mV

## 2018-09-26 LAB — CBC
HCT: 30.2 % — ABNORMAL LOW (ref 36.0–46.0)
Hemoglobin: 9.5 g/dL — ABNORMAL LOW (ref 12.0–15.0)
MCH: 29.3 pg (ref 26.0–34.0)
MCHC: 31.5 g/dL (ref 30.0–36.0)
MCV: 93.2 fL (ref 80.0–100.0)
Platelets: 168 10*3/uL (ref 150–400)
RBC: 3.24 MIL/uL — ABNORMAL LOW (ref 3.87–5.11)
RDW: 15.5 % (ref 11.5–15.5)
WBC: 4.6 10*3/uL (ref 4.0–10.5)
nRBC: 0 % (ref 0.0–0.2)

## 2018-09-26 MED ORDER — EPOETIN ALFA 40000 UNIT/ML IJ SOLN
INTRAMUSCULAR | Status: AC
  Filled 2018-09-26: qty 1

## 2018-09-26 MED ORDER — EPOETIN ALFA 40000 UNIT/ML IJ SOLN
40000.0000 [IU] | Freq: Once | INTRAMUSCULAR | Status: AC
  Administered 2018-09-26: 40000 [IU] via SUBCUTANEOUS

## 2018-09-26 NOTE — Progress Notes (Signed)
[name] [name] tolerated Procrit injection without incident or complaint. Hgb reviewed, 9.5. Discharged via wheelchair in satisfactory condition in company of family member.

## 2018-09-26 NOTE — ED Triage Notes (Signed)
Pt c/o sudden onset of sob at home. Pt is on 1lpm of o2 all the time. Pt is in no distress in triage. Pt states she vomited once last night.

## 2018-09-26 NOTE — Patient Instructions (Signed)
[place] [facility]  Discharge Instructions:  You received [place] today. _______________________________________________________________  Thank you for choosing [place] at [facility] to provide your oncology and hematology care.  To afford each patient quality time with our providers, please arrive at least 15 minutes before your scheduled appointment.  You need to re-schedule your appointment if you arrive 10 or more minutes late.  We strive to give you quality time with our providers, and arriving late affects you and other patients whose appointments are after yours.  Also, if you no show three or more times for appointments you may be dismissed from the clinic.  Again, thank you for choosing [place] [facility] hope is that these requests will allow you access to exceptional care and in a timely manner. _______________________________________________________________  If you have questions after your visit, please contact our office at [phone] between the hours of 8:30 a.m. and 5:00 p.m. Voicemails left after 4:30 p.m. will not be returned until the following business day. _______________________________________________________________  For prescription refill requests, have your pharmacy contact our office. _______________________________________________________________  Recommendations made by the consultant and any test results will be sent to your referring physician. _______________________________________________________________

## 2018-09-26 NOTE — Progress Notes (Signed)
Remote ICD transmission.   

## 2018-09-26 NOTE — ED Provider Notes (Signed)
[facility] Emergency Department Provider Note MRN:  [id number]  Arrival date & time: 09/27/18     Chief Complaint   Shortness of Breath   History of Present Illness   [name] [name] is a 85 y.o. year-old female with a history of CHF, COPD, diabetes presenting to the ED with chief complaint of shortness of breath.  Shortness of breath sounds yesterday.  Described more as a dyspnea on exertion, worse with ambulating.  Patient also explains that her fluid pill dose was recently increased and she feels dehydrated.  Denies headache or vision change, no chest pain, no abdominal pain, no numbness or weakness to the arms or legs, no fever, no dysuria.  Review of Systems  A complete 10 system review of systems was obtained and all systems are negative except as noted in the HPI and PMH.   Patient's Health History    Past Medical History:  Diagnosis Date  . Adenomatous polyp of colon 12/16/2002   Dr. [name]/[facility]  . Allergy   . Cataract   . CHB (complete heart block) ([place]) 10/07/2014      . CHF (congestive heart failure) ([place])    a.  EF previously reduced to 25-30% b. EF improved to 60-65% by echo in 10/2017.   Marland Kitchen Chronic kidney disease, stage I 2006  . Constipation       . COPD (chronic obstructive pulmonary disease) ([place])       . DJD (degenerative joint disease) of lumbar spine   . DM (diabetes mellitus) ([place]) 2006  . Esophageal reflux   . Glaucoma   . Gout       . H/O heart valve replacement with mechanical valve    a. s/p mechanical AVR in 2001 and mechanical MVR in 2004.  Marland Kitchen H/O wisdom tooth extraction   . Hyperlipemia   . IDA (iron deficiency anemia)    Parenteral iron/[name]  . Insomnia       . Non-ischemic cardiomyopathy ([place])    a. s/p MDT CRTD  . Obesity, unspecified   . Other and unspecified hyperlipidemia   . Oxygen deficiency 2014   nocturnal;  . Spondylolisthesis   . Spondylosis   . Thyroid cancer  ([place])    remote thyroidectomy, no recurrence, pt denies in 2016 thyroid cancer  . Unspecified hypothyroidism       . 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 Vertigo         Past Surgical History:  Procedure Laterality Date  . AORTIC AND MITRAL VALVE REPLACEMENT  2001  . BI-VENTRICULAR IMPLANTABLE CARDIOVERTER DEFIBRILLATOR UPGRADE N/A 01/18/2015   Procedure: BI-VENTRICULAR IMPLANTABLE CARDIOVERTER DEFIBRILLATOR UPGRADE;  Surgeon: Evans Lance, MD;  Location[facility] CATH LAB;  Service: Cardiovascular;  Laterality: N/A;  . CARDIAC CATHETERIZATION    . CARDIAC VALVE REPLACEMENT    . CARDIOVERSION N/A 11/13/2016   Procedure: CARDIOVERSION;  Surgeon: Sanda Klein, MD;  Location: [facility] ENDOSCOPY;  Service: Cardiovascular;  Laterality: N/A;  . COLONOSCOPY  06/12/2007   Rourk- Normal rectum/Normal colon  . COLONOSCOPY  12/16/02   small hemorrhoids  . COLONOSCOPY  06/20/2012   Procedure: COLONOSCOPY;  Surgeon: Daneil Dolin, MD;  Location: [facility] ENDO SUITE;  Service: Endoscopy;  Laterality: N/A;  10:45  . defibrillator implanted 2006    . DOPPLER ECHOCARDIOGRAPHY  2012  . DOPPLER ECHOCARDIOGRAPHY  05,06,07,08,09,2011  . ESOPHAGOGASTRODUODENOSCOPY  06/12/2007   Rourk- normal esophagus, small hiatal hernia, otherwise normal  stomach, D1, D2  . EYE SURGERY Right 2005  . EYE SURGERY Left 2006  . ICD LEAD REMOVAL Left 02/08/2015   Procedure: ICD LEAD REMOVAL;  Surgeon: Evans Lance, MD;  Location: [facility] OR;  Service: Cardiovascular;  Laterality: Left;  "Will plan extraction and insertion of a BiV PM"  **Dr. Roxan Hockey backing up case**  . IMPLANTABLE CARDIOVERTER DEFIBRILLATOR (ICD) GENERATOR CHANGE Left 02/08/2015   Procedure: ICD GENERATOR CHANGE;  Surgeon: Evans Lance, MD;  Location: [place];  Service: Cardiovascular;  Laterality: Left;  . INSERT / REPLACE / REMOVE PACEMAKER    . PACEMAKER INSERTION  May 2016  . pacemaker placed  2004  . right breast cyst removed     benign    . right cataract removed     2005  . TEE WITHOUT CARDIOVERSION N/A 11/13/2016   Procedure: TRANSESOPHAGEAL ECHOCARDIOGRAM (TEE);  Surgeon: Sanda Klein, MD;  Location: [facility] ENDOSCOPY;  Service: Cardiovascular;  Laterality: N/A;  . THYROIDECTOMY      Family History  Problem Relation Age of Onset  . Pancreatic cancer Mother   . Heart disease Father   . Heart disease Sister   . Heart attack Sister   . Heart disease Brother   . Diabetes Brother   . Heart disease Brother   . Diabetes Brother   . Hypertension Brother   . Lung cancer Brother     Social History   Socioeconomic History  . Marital status: Widowed    Spouse name: Not on file  . Number of children: 0  . Years of education: Not on file  . Highest education level: Not on file  Occupational History  . Occupation: retired    Fish farm manager: RETIRED  Social Needs  . Financial resource strain: Not on file  . Food insecurity:    Worry: Not on file    Inability: Not on file  . Transportation needs:    Medical: Not on file    Non-medical: Not on file  Tobacco Use  . Smoking status: Former Smoker    Packs/day: 0.75    Years: 40.00    Pack years: 30.00    Types: Cigarettes    Last attempt to quit: 03/08/1987    Years since quitting: 31.5  . Smokeless tobacco: Never Used  Substance and Sexual Activity  . Alcohol use: No    Alcohol/week: 0.0 standard drinks  . Drug use: No  . Sexual activity: Not Currently  Lifestyle  . Physical activity:    Days per week: Not on file    Minutes per session: Not on file  . Stress: Not on file  Relationships  . Social connections:    Talks on phone: Not on file    Gets together: Not on file    Attends religious service: Not on file    Active member of club or organization: Not on file    Attends meetings of clubs or organizations: Not on file    Relationship status: Not on file  . Intimate partner violence:    Fear of current or ex partner: Not on file    Emotionally abused: Not  on file    Physically abused: Not on file    Forced sexual activity: Not on file  Other Topics Concern  . Not on file  Social History Narrative   From [place] (2004)     Physical Exam  Vital Signs and Nursing Notes reviewed Vitals:   09/26/18 2300 09/26/18 2330  BP: (!) 163/74 Marland Kitchen)  141/77  Pulse: 70 69  Resp: 19 (!) 21  Temp:    SpO2: 98% 100%    CONSTITUTIONAL: Well-appearing, NAD NEURO:  Alert and oriented x 3, no focal deficits EYES:  eyes equal and reactive ENT/NECK:  no LAD, no JVD CARDIO: Regular rate, well-perfused, normal S1 and S2 PULM:  CTAB no wheezing or rhonchi GI/GU:  normal bowel sounds, non-distended, non-tender MSK/SPINE:  No gross deformities, no edema SKIN:  no rash, atraumatic PSYCH:  Appropriate speech and behavior  Diagnostic and Interventional Summary    EKG Interpretation  Date/Time:  Thursday September 26 2018 23:02:11 EST Ventricular Rate:  70 PR Interval:    QRS Duration: 195 QT Interval:  494 QTC Calculation: 534 R Axis:   114 Text Interpretation:  Ventricular-paced complexes No further analysis attempted due to paced rhythm Baseline wander in lead(s) V6 Confirmed by Gerlene Fee [phone]) on 09/26/2018 11:38:53 PM      Labs Reviewed  BASIC METABOLIC PANEL - Abnormal; Notable for the following components:      Result Value   Sodium 129 (*)    Chloride 94 (*)    Glucose, Bld 100 (*)    BUN 40 (*)    Creatinine, Ser 1.45 (*)    GFR calc non Af Amer 33 (*)    GFR calc Af Amer 38 (*)    All other components within normal limits  BRAIN NATRIURETIC PEPTIDE  CBC WITH DIFFERENTIAL/PLATELET  I-STAT TROPONIN, ED  POCT I-STAT TROPONIN I    DG Chest 2 View  Final Result      Medications  sodium chloride 0.9 % bolus 250 mL (has no administration in time range)     Procedures Critical Care  ED Course and Medical Decision Making  I have reviewed the triage vital signs and the nursing notes.  Pertinent labs & imaging results that were  available during my care of the patient were reviewed by me and considered in my medical decision making (see below for details).  Low concern for emergent process in this 85 year old female with mild dyspnea on exertion.  CBC done earlier today with no severe change to her H&H.  Lungs clear on exam.  Vital signs stable.  No evidence of fluid overload on exam, no leg swelling or edema, no evidence of DVT, no chest pain.  Chest x-ray unremarkable.  Given patient's concern that she is dehydrated, will screen with labs, BMP and troponin pending.  Favoring discharge with reassurance and close PCP follow-up.  Labs reveal hyponatremia, moderate in severity.  Discussed options of very close outpatient follow-up versus inpatient management.  Patient was uncomfortable with the outpatient plan, was not confident that she would be able to see her PCP within the next few days.  Admitted to hospitalist service for further care and evaluation, Dr. Olevia Bowens accepting physician.  Barth Kirks. Sedonia Small, [place] [email or web address]  Final Clinical Impressions(s) / ED Diagnoses     ICD-10-CM   1. Hyponatremia E87.1     ED Discharge Orders    None         Maudie Flakes, MD 09/27/18 [phone]

## 2018-09-27 DIAGNOSIS — R9431 Abnormal electrocardiogram [ECG] [EKG]: Secondary | ICD-10-CM

## 2018-09-27 DIAGNOSIS — E860 Dehydration: Secondary | ICD-10-CM

## 2018-09-27 DIAGNOSIS — E871 Hypo-osmolality and hyponatremia: Secondary | ICD-10-CM | POA: Diagnosis not present

## 2018-09-27 LAB — CBC WITH DIFFERENTIAL/PLATELET
Abs Immature Granulocytes: 0.02 10*3/uL (ref 0.00–0.07)
Basophils Absolute: 0 10*3/uL (ref 0.0–0.1)
Basophils Relative: 0 %
Eosinophils Absolute: 0.1 10*3/uL (ref 0.0–0.5)
Eosinophils Relative: 3 %
HCT: 29.2 % — ABNORMAL LOW (ref 36.0–46.0)
Hemoglobin: 9.1 g/dL — ABNORMAL LOW (ref 12.0–15.0)
Immature Granulocytes: 1 %
Lymphocytes Relative: 14 %
Lymphs Abs: 0.6 10*3/uL — ABNORMAL LOW (ref 0.7–4.0)
MCH: 29.2 pg (ref 26.0–34.0)
MCHC: 31.2 g/dL (ref 30.0–36.0)
MCV: 93.6 fL (ref 80.0–100.0)
Monocytes Absolute: 0.5 10*3/uL (ref 0.1–1.0)
Monocytes Relative: 11 %
Neutro Abs: 3.1 10*3/uL (ref 1.7–7.7)
Neutrophils Relative %: 71 %
Platelets: 168 10*3/uL (ref 150–400)
RBC: 3.12 MIL/uL — ABNORMAL LOW (ref 3.87–5.11)
RDW: 15.7 % — ABNORMAL HIGH (ref 11.5–15.5)
WBC: 4.3 10*3/uL (ref 4.0–10.5)
nRBC: 0 % (ref 0.0–0.2)

## 2018-09-27 LAB — BRAIN NATRIURETIC PEPTIDE: B Natriuretic Peptide: 368 pg/mL — ABNORMAL HIGH (ref 0.0–100.0)

## 2018-09-27 LAB — POCT I-STAT TROPONIN I: Troponin i, poc: 0.03 ng/mL (ref 0.00–0.08)

## 2018-09-27 LAB — BASIC METABOLIC PANEL
Anion gap: 9 (ref 5–15)
BUN: 40 mg/dL — ABNORMAL HIGH (ref 8–23)
CO2: 26 mmol/L (ref 22–32)
Calcium: 9.2 mg/dL (ref 8.9–10.3)
Chloride: 94 mmol/L — ABNORMAL LOW (ref 98–111)
Creatinine, Ser: 1.45 mg/dL — ABNORMAL HIGH (ref 0.44–1.00)
GFR calc Af Amer: 38 mL/min — ABNORMAL LOW (ref >60)
GFR calc non Af Amer: 33 mL/min — ABNORMAL LOW (ref >60)
Glucose, Bld: 100 mg/dL — ABNORMAL HIGH (ref 70–99)
Potassium: 4.1 mmol/L (ref 3.5–5.1)
Sodium: 129 mmol/L — ABNORMAL LOW (ref 135–145)

## 2018-09-27 MED ORDER — SODIUM CHLORIDE 0.9 % IV BOLUS
250.0000 mL | Freq: Once | INTRAVENOUS | Status: AC
  Administered 2018-09-27: 250 mL via INTRAVENOUS

## 2018-09-27 MED ORDER — FUROSEMIDE 40 MG PO TABS: 40.0000 mg | ORAL_TABLET | Freq: Every day | ORAL | 1 refills | Status: DC

## 2018-09-27 MED ORDER — METOLAZONE 2.5 MG PO TABS: 2.5000 mg | ORAL_TABLET | ORAL | 3 refills | Status: DC

## 2018-09-27 MED ORDER — ONDANSETRON HCL 4 MG/2ML IJ SOLN
4.0000 mg | Freq: Once | INTRAMUSCULAR | Status: AC
  Administered 2018-09-27: 4 mg via INTRAVENOUS
  Filled 2018-09-27: qty 2

## 2018-09-27 NOTE — Discharge Instructions (Addendum)
Please hold furosemide dose for today (1/3) and start from tomorrow 1/4 as per your regular schedule. Please hold metolazone dose for tomorrow (1/4) and resume on 1/8 as per your regular schedule  please follow up with your cardiologist as scheduled on 1/21  You were evaluated in the Emergency Department and after careful evaluation, we did not find any emergent condition requiring admission or further testing in the hospital.  Your symptoms today seem to be due to a low sodium level, likely due to the recent increase in your fluid pill dosing.  It is very important that we follow-up on this lab value with a repeat testing and no more than 3 days.  Please call your primary care doctor tomorrow for advice on how to manage your Lasix dosing.  Your sodium level needs to be retested no later than this coming Monday.  If you are unable to contact your regular doctor tomorrow, please keep your Lasix dosing for tomorrow.  Take your normal 40 mg dose Saturday and Sunday, and return to the emergency department on Monday for repeat lab testing.  Please return to the Emergency Department if you experience any worsening of your condition.  We encourage you to follow up with a primary care provider.  Thank you for allowing [place] to be a part of your care.

## 2018-09-27 NOTE — Consult Note (Addendum)
Consult Note                                                      [name] [name]  ZOX:[id number]  DOB: Jan 10, 1932  DOA: 09/26/2018  PCP: Fayrene Helper, MD   Outpatient Specialists: Dr Sallyanne Kuster (cardiology)   Requesting physician: Dr. Sedonia Small  Reason for consultation: Hyponatremia   History of Present llness   [name] [name] is an 85 y.o. female with  history including diastolic CHF, complete heart block and nonischemic cardiomyopathy status post CRT, diabetes mellitus (not on meds), esophageal reflux, mechanical AVR and MVR on chronic Coumadin, iron deficiency anemia who presented to the ED feeling dehydrated.  She was seen in the ED 2 months back with acute CHF symptoms and then followed by her cardiologist 1 week later who increased her metolazone to twice daily (was on once daily prior to that).  She is also on daily Lasix 40 mg daily and reports being adherent to the meds. Patient reports that she had an episode of vomiting the previous night and since has been feeling dehydrated and weak.  She denies any shortness of breath, chest pain, palpitations, headache, dizziness, near syncope, abdominal pain, dysuria, diarrhea, fevers, chills, recent travel or sick contact.  Denies missing out on her medications and recent illness.  Course in the ED Blood pressure was elevated.  Blood work showed hemoglobin of 9.1 (baseline), sodium of 129, chloride of 94, BUN of 40 and creatinine 1.45 (at baseline) Patient given 250 cc normal saline bolus and hospitalist consulted for observation for hyponatremia.      Review Of Systems     In addition to the HPI above,  No Fever-chills, No Headache, No changes with Vision or hearing, No problems swallowing food or Liquids, No Chest pain, Cough or Shortness of Breath, No Abdominal pain, nausea +, vomiting +, Bowel  movements are regular, No Blood in stool or Urine, No dysuria, No new skin rashes or bruises, No new joints pains-aches,  Weakness +, tingling, numbness in any extremity, No recent weight gain or loss, No polyuria, polydypsia or polyphagia, No significant Mental Stressors.     Social History   Social History   Tobacco Use  . Smoking status: Former Smoker    Packs/day: 0.75    Years: 40.00    Pack years: 30.00    Types: Cigarettes    Last attempt to quit: 03/08/1987    Years since quitting: 31.5  . Smokeless tobacco: Never Used  Substance Use Topics  . Alcohol use: No  Alcohol/week: 0.0 standard drinks     Family History   Family History  Problem Relation Age of Onset  . Pancreatic cancer Mother   . Heart disease Father   . Heart disease Sister   . Heart attack Sister   . Heart disease Brother   . Diabetes Brother   . Heart disease Brother   . Diabetes Brother   . Hypertension Brother   . Lung cancer Brother      Medications   Prior to Admission medications   Medication Sig Start Date End Date Taking? Authorizing Provider  acetaminophen (TYLENOL) 500 MG tablet Take 500 mg by mouth every 6 (six) hours as needed (pain).    [provider]  albuterol (PROVENTIL HFA;VENTOLIN HFA) 108 (90 Base) MCG/ACT inhaler INHALE 2 PUFFS INTO THE LUNGS EVERY 6 HOURS AS NEEDED FOR WHEEZING OR SHORTNESS OF BREATH 07/17/18   Fayrene Helper, MD  albuterol (PROVENTIL) (2.5 MG/3ML) 0.083% nebulizer solution INHALE 1 VIAL VIA NEBULIZER THREE TIMES DAILY. 12/28/16   Fayrene Helper, MD  allopurinol (ZYLOPRIM) 100 MG tablet Take 1 tablet (100 mg total) by mouth daily. 08/05/18   Fayrene Helper, MD  benzonatate (TESSALON PERLES) 100 MG capsule Take 1 capsule (100 mg total) by mouth every 6 (six) hours as needed for cough. 08/29/18   Fayrene Helper, MD  BREO ELLIPTA 100-25 MCG/INH AEPB Inhale 1 puff into the lungs daily.  10/04/17   [provider]    calcium carbonate (OS-CAL) 1250 (500 Ca) MG chewable tablet Chew 1 tablet by mouth daily. 07/15/10   [provider]  COUMADIN 5 MG tablet Take 2 tablets daily except 1 tablet on Tuesdays and Saturdays 08/12/18   Croitoru, Mihai, MD  docusate sodium (COLACE) 100 MG capsule Take 300 mg by mouth at bedtime.    [provider]  fluticasone (FLONASE) 50 MCG/ACT nasal spray Place 2 sprays into both nostrils daily. Pt needs now 08/15/18   Fayrene Helper, MD  folic acid (FOLVITE) 1 MG tablet TAKE 1 TABLET BY MOUTH ONCE DAILY 08/27/18   Fayrene Helper, MD  furosemide (LASIX) 40 MG tablet Take 1 tablet (40 mg total) by mouth daily. Skip dose today and start from 1/4 09/28/18   Codie Hainer, MD  gabapentin (NEURONTIN) 300 MG capsule TAKE 1 CAPSULE BY MOUTH AT BEDTIME 08/06/18   Fayrene Helper, MD  guaiFENesin (MUCINEX) 600 MG 12 hr tablet Take 1 tablet (600 mg total) by mouth 2 (two) times daily. 11/17/17 11/17/18  Kathie Dike, MD  hydrALAZINE (APRESOLINE) 25 MG tablet TAKE 1 TABLET THREE TIMES A DAY 09/23/18   Fayrene Helper, MD  HYDROcodone-acetaminophen (NORCO) 7.5-325 MG tablet Take 1 tablet by mouth every 6 (six) hours as needed for moderate pain (Must last 30 days.). 08/15/18   Sanjuana Kava, MD  isosorbide mononitrate (IMDUR) 60 MG 24 hr tablet TAKE 1 TABLET BY MOUTH ONCE DAILY 08/06/18   Croitoru, Dani Gobble, MD  meclizine (ANTIVERT) 25 MG tablet Take 1 tablet (25 mg total) by mouth 3 (three) times daily as needed for dizziness. 05/01/17   Fayrene Helper, MD  metolazone (ZAROXOLYN) 2.5 MG tablet Take 1 tablet (2.5 mg total) by mouth 2 (two) times a week. Wednesdays and Saturdays  Skip dose on 1/4 and start from 1/8 10/02/18   Kresta Templeman, MD  metoprolol succinate (TOPROL-XL) 100 MG 24 hr tablet TAKE 1 TABLET BY MOUTH DAILY. TAKE WITH OR IMMEDIATELY FOLLOWING A MEAL. 08/06/18  Fayrene Helper, MD  Multiple Vitamin (MULTIVITAMIN) LIQD Take 5 mLs by mouth  daily.    [provider]  ondansetron (ZOFRAN) 4 MG tablet Take 1 tablet (4 mg total) by mouth daily as needed for nausea or vomiting. 02/25/18   Fayrene Helper, MD  Polyethyl [name] (SYSTANE OP) Place 1 drop into both eyes daily as needed (dry eyes).    [provider]  potassium chloride SA (KLOR-CON M20) 20 MEQ tablet Take 1 tablet by mouth  daily 09/05/18   Fayrene Helper, MD  pravastatin (PRAVACHOL) 40 MG tablet TAKE 1 TABLET BY MOUTH EVERY EVENING 08/07/18   Croitoru, Mihai, MD  SYNTHROID 75 MCG tablet TAKE 1 TABLET DAILY 08/05/18   Fayrene Helper, MD    Anti-infectives (From admission, onward)   None      Scheduled Meds: Continuous Infusions: PRN Meds:.  Allergies  Allergen Reactions  . Penicillins Other (See Comments)    Bruise Has patient had a PCN reaction causing immediate rash, facial/tongue/throat swelling, SOB or lightheadedness with hypotension: no Has patient had a PCN reaction causing severe rash involving mucus membranes or skin necrosis: no Has patient had a PCN reaction that required hospitalization: no Has patient had a PCN reaction occurring within the last 10 years: no If all of the above answers are "NO", then may proceed with Cephalosporin use.   . Aspirin Other (See Comments)    On coumadin  . Fentanyl Dermatitis    Rash with patch   . Niacin Itching    Objective   Physical Exam  Vitals  Blood pressure (!) 179/88, pulse 60, temperature 97.8 F (36.6 C), temperature source Oral, resp. rate 17, height 5\' 7"  (1.702 m), weight 89.4 kg, SpO2 100 %.   General: Elderly female lying in bed in no acute distress HEENT: Pallor present, no icterus, moist mucosa, supple neck Chest: Clear bilaterally, no added sound CVS: Normal S1-S2, no murmurs rub or gallop GI: Soft, nondistended, nontender, bowel sounds present Musculoskeletal: Warm, no edema CNS: Alert and oriented, nonfocal  Data   CBC Recent Labs    Lab 09/26/18 1214 09/26/18 2330  WBC 4.6 4.3  HGB 9.5* 9.1*  HCT 30.2* 29.2*  PLT 168 168  MCV 93.2 93.6  MCH 29.3 29.2  MCHC 31.5 31.2  RDW 15.5 15.7*  LYMPHSABS  --  0.6*  MONOABS  --  0.5  EOSABS  --  0.1  BASOSABS  --  0.0   ------------------------------------------------------------------------------------------------------------------  Chemistries  Recent Labs  Lab 09/26/18 2330  NA 129*  K 4.1  CL 94*  CO2 26  GLUCOSE 100*  BUN 40*  CREATININE 1.45*  CALCIUM 9.2   ------------------------------------------------------------------------------------------------------------------ estimated creatinine clearance is 33.1 mL/min (A) (by C-G formula based on SCr of 1.45 mg/dL (H)). ------------------------------------------------------------------------------------------------------------------ No results for input(s): TSH, T4TOTAL, T3FREE, THYROIDAB in the last 72 hours.  Invalid input(s): FREET3   Coagulation profile No results for input(s): INR, PROTIME in the last 168 hours. ------------------------------------------------------------------------------------------------------------------- No results for input(s): DDIMER in the last 72 hours. -------------------------------------------------------------------------------------------------------------------  Cardiac Enzymes No results for input(s): CKMB, TROPONINI, MYOGLOBIN in the last 168 hours.  Invalid input(s): CK ------------------------------------------------------------------------------------------------------------------ Invalid input(s): POCBNP   ---------------------------------------------------------------------------------------------------------------  Urinalysis    Component Value Date/Time   COLORURINE STRAW (A) 11/15/2017 1147   APPEARANCEUR CLEAR 11/15/2017 1147   LABSPEC 1.006 11/15/2017 1147   PHURINE 5.0 11/15/2017 1147   GLUCOSEU NEGATIVE 11/15/2017 1147   GLUCOSEU NEG mg/dL  03/22/2010 2313  HGBUR NEGATIVE 11/15/2017 1147   HGBUR negative 05/27/2010 1122   BILIRUBINUR NEGATIVE 11/15/2017 1147   BILIRUBINUR neg 03/11/2013 [place] 11/15/2017 1147   PROTEINUR NEGATIVE 11/15/2017 1147   UROBILINOGEN 0.2 03/11/2013 1443   UROBILINOGEN 0.2 03/27/2012 1201   NITRITE NEGATIVE 11/15/2017 1147   LEUKOCYTESUR NEGATIVE 11/15/2017 1147     Imaging    Dg Chest 2 View  Result Date: 09/26/2018 CLINICAL DATA:  Shortness of breath. Cough. EXAM: CHEST - 2 VIEW COMPARISON:  Radiographs and CT 07/30/2018 FINDINGS: Post median sternotomy. Unchanged mild cardiomegaly. Unchanged mediastinal contours with aortic atherosclerosis and tortuosity. Prosthetic mitral and aortic valves. Left-sided pacemaker remains in place. Improved reticulonodular opacities throughout both lungs from prior exam. No new airspace disease, pleural effusion, pneumothorax or pulmonary edema. Mild chronic wedging of midthoracic vertebra. IMPRESSION: Improved reticulonodular opacities from exams 1 year prior. No new abnormality. Aortic Atherosclerosis (ICD10-I70.0). Electronically Signed   By: Keith Rake M.D.   On: 09/26/2018 21:11    My personal review of EKG: Normal sinus rhythm at 70, paced rhythm, QTC of 530.  Assessment & Paln   Hyponatremia with mild dehydration Possibly with diuretic.  Given 250 cc normal saline bolus in the ED and patient feels much better.  I have instructed her to hold Lasix today and resume regular dose from tomorrow.  Also instructed to hold her metolazone dose for tomorrow and resume next week starting 1/8 as regular schedule. Remaining home medicine can be continued. Monitor sodium as outpatient (has appointment with cardiologist in <3 weeks).  Instructed to see her PCP prior to that.  Prolonged QTC (530) Potassium normal.  Not on any QT prolonging getting medications.  Follow-up as outpatient.  Nausea and vomiting Had only one episode of vomiting at  home 2 nights prior.  Nauseous in the ED but resolved with Zofran.  Tolerating diet.  Has Zofran at home.  Patient clinically stable to be discharged home with outpatient PCP and cardiology follow-up.  She is asymptomatic upon my evaluation.  Her hyponatremia is mild and dehydration symptoms have improved with small IV bolus in the ED.  Discharge instructions given.  Notified ED physician Dr Alvino Chapel.    Flonnie Overman Kong Packett M.D on 09/27/2018 at 8:52 AM  Between 7am to 7pm - Pager - [phone]. After 7pm go to [email or web address] - password [email or web address]  [facility]  - Office  [phone]   Thank you for the consult, we will follow the patient with you in the Hospital.

## 2018-09-27 NOTE — ED Notes (Signed)
Pt moved to hospital  Bed for comfort.

## 2018-09-27 NOTE — ED Notes (Signed)
Pt is calling for a ride home.

## 2018-09-27 NOTE — ED Notes (Signed)
Called Dr [name] for and appt scheduled Monday at 100 pm. Pt is aware

## 2018-09-27 NOTE — Progress Notes (Signed)
EPIC Encounter for ICM Monitoring  Patient Name: [name] [name] is a 85 y.o. female Date: 09/27/2018 Primary Care Physican: Simpson, Margaret E, MD Primary Cardiologist:Croitoru Electrophysiologist:Taylor Bi-V Pacing:99.8% Last Weight:194.6lbs Today's Weight: unknown   Clinical Status (26-Aug-2018 to 26-Sep-2018)  Treated VT/VF 0 episodes   AT/AF 1 episode   Time in AT/AF 24.0 hr/day (100.0%)  Observations (2) (26-Aug-2018 to 26-Sep-2018)  AT/AF >= 6 hr for 31 days.  Patient Activity less than 1 hr/day for 4 weeks .                                                   Transmission reviewed.  Patient currently hospitalized.   Thoracic impedance abnormal and trending just under baseline since ~08/25/2018.   Prescribed: Furosemide40 mg 1 tablet daily. Metolazaone 2.5 mg Take 1tablet twicea week (Wednesday and Saturdays). Potassium 20 mEq take 1 tablet daily.  Labs: 09/26/2018 Creatinine 1.45, BUN 40, Potassium 4.1, Sodium 129, eGFR 33-38 08/29/2018 Creatinine 1.85, BUN 49, Potassium 4.2, Sodium 134, eGFR 25-28 08/15/2018 Creatinine 1.63, BUN 43, Potassium 4.1, Sodium 135, eGFR 28-32 07/30/2018 Creatinine 1.58, BUN 49, Potassium 4.0, Sodium 135, eGFR 29-34 07/09/2018 Creatinine 1.60, BUN 48, Potassium 1.9, Sodium 136, eGFR 29-34 04/09/2018 Creatinine 1.91, BUN 53, Potassium 4.4, Sodium 135 03/30/2018 Creatinine 1.78, BUN 54, Potassium 3.9, Sodium 133, EGFR 25-29 12/11/2017 Creatinine1.89, BUN44, Potassium4.2, Sodium139, EGFR23-27 A complete set of results can be found in Results Review.  Recommendations: None, patient currently hospitalized  Follow-up plan: ICM clinic phone appointment on 10/14/2018 to recheck fluid levels.    Copy of ICM check sent to Dr.Taylor and Dr Croitoru.    3 month ICM trend: 09/26/2018    1 Year ICM trend:        S , RN 09/27/2018 8:24 AM   

## 2018-09-27 NOTE — ED Notes (Signed)
Pt reports nausea. Will message hospitalist

## 2018-09-27 NOTE — ED Notes (Signed)
Pt transferred to hospital bed for comfort. Pure wick removed d/t pt wanting to reposition. Call bell left within reach.

## 2018-09-30 ENCOUNTER — Telehealth: Payer: Self-pay

## 2018-09-30 ENCOUNTER — Ambulatory Visit (INDEPENDENT_AMBULATORY_CARE_PROVIDER_SITE_OTHER): Payer: Medicare Other | Admitting: Family Medicine

## 2018-09-30 ENCOUNTER — Encounter: Payer: Self-pay | Admitting: Family Medicine

## 2018-09-30 VITALS — BP 140/54 | HR 71 | Resp 15 | Ht 67.0 in | Wt 187.0 lb

## 2018-09-30 DIAGNOSIS — E871 Hypo-osmolality and hyponatremia: Secondary | ICD-10-CM

## 2018-09-30 DIAGNOSIS — E785 Hyperlipidemia, unspecified: Secondary | ICD-10-CM

## 2018-09-30 DIAGNOSIS — I428 Other cardiomyopathies: Secondary | ICD-10-CM | POA: Diagnosis not present

## 2018-09-30 DIAGNOSIS — Z09 Encounter for follow-up examination after completed treatment for conditions other than malignant neoplasm: Secondary | ICD-10-CM

## 2018-09-30 DIAGNOSIS — M791 Myalgia, unspecified site: Secondary | ICD-10-CM

## 2018-09-30 DIAGNOSIS — I10 Essential (primary) hypertension: Secondary | ICD-10-CM

## 2018-09-30 DIAGNOSIS — N184 Chronic kidney disease, stage 4 (severe): Secondary | ICD-10-CM | POA: Diagnosis not present

## 2018-09-30 DIAGNOSIS — I255 Ischemic cardiomyopathy: Secondary | ICD-10-CM

## 2018-09-30 MED ORDER — FOLIC ACID 1 MG PO TABS: 1.0000 mg | ORAL_TABLET | Freq: Every day | ORAL | 5 refills | Status: DC

## 2018-09-30 MED ORDER — ALBUTEROL SULFATE HFA 108 (90 BASE) MCG/ACT IN AERS: INHALATION_SPRAY | RESPIRATORY_TRACT | 2 refills | Status: DC

## 2018-09-30 MED ORDER — ISOSORBIDE MONONITRATE ER 60 MG PO TB24: 60.0000 mg | ORAL_TABLET | Freq: Every day | ORAL | 1 refills | Status: DC

## 2018-09-30 NOTE — Patient Instructions (Addendum)
F/U  As before, call if you need me sooner  Please stay on medication as prescribed by Cardiology at the same dose   TSH, Chem 7 and EGFR and magnesium today, you will be contacted with result, I will discuss with cardiology if change needs to be made

## 2018-09-30 NOTE — Telephone Encounter (Signed)
Transition Care Management Follow-up Telephone Call   Date discharged?     09-27-18           How have you been since you were released from the hospital? Doing better    Do you understand why you were in the hospital? Yes, I was short of breath   Do you understand the discharge instructions? Yes    Where were you discharged to? Home    Items Reviewed:  Medications reviewed: Yes   Allergies reviewed: Yes   Dietary changes reviewed: Heart healthy   Referrals reviewed: Yes    Functional Questionnaire:   Activities of Daily Living (ADLs):  Need a little help right now    Any transportation issues/concerns?: None    Any patient concerns?None    Confirmed importance and date/time of follow-up visits scheduled Follow up appointment today 09-30-18 with doctor [name]      Confirmed with patient if condition begins to worsen call PCP or go to the ER.  Patient was given the office number and encouraged to call back with question or concerns.  :

## 2018-10-01 LAB — MAGNESIUM: Magnesium: 2 mg/dL (ref 1.5–2.5)

## 2018-10-01 LAB — BASIC METABOLIC PANEL WITH GFR
BUN/Creatinine Ratio: 23 (calc) — ABNORMAL HIGH (ref 6–22)
BUN: 31 mg/dL — ABNORMAL HIGH (ref 7–25)
CO2: 30 mmol/L (ref 20–32)
Calcium: 9.5 mg/dL (ref 8.6–10.4)
Chloride: 97 mmol/L — ABNORMAL LOW (ref 98–110)
Creat: 1.35 mg/dL — ABNORMAL HIGH (ref 0.60–0.88)
GFR, Est African American: 42 mL/min/{1.73_m2} — ABNORMAL LOW (ref >=60)
GFR, Est Non African American: 36 mL/min/{1.73_m2} — ABNORMAL LOW (ref >=60)
Glucose, Bld: 86 mg/dL (ref 65–99)
Potassium: 4.4 mmol/L (ref 3.5–5.3)
Sodium: 133 mmol/L — ABNORMAL LOW (ref 135–146)

## 2018-10-01 LAB — TSH: TSH: 1.21 mIU/L (ref 0.40–4.50)

## 2018-10-07 ENCOUNTER — Ambulatory Visit (INDEPENDENT_AMBULATORY_CARE_PROVIDER_SITE_OTHER): Payer: Medicare Other | Admitting: Pharmacist

## 2018-10-07 DIAGNOSIS — Z952 Presence of prosthetic heart valve: Secondary | ICD-10-CM | POA: Diagnosis not present

## 2018-10-07 DIAGNOSIS — Z5181 Encounter for therapeutic drug level monitoring: Secondary | ICD-10-CM | POA: Diagnosis not present

## 2018-10-07 LAB — POCT INR: INR: 1.6 — AB (ref 2.0–3.0)

## 2018-10-07 NOTE — Patient Instructions (Signed)
Description   Take 3 tablets tonight then continue coumadin 2 tablets daily except 1 tablet on Tuesdays and Saturdays.  Continue greens  Recheck in 3 weeks.

## 2018-10-09 ENCOUNTER — Telehealth: Payer: Self-pay | Admitting: *Deleted

## 2018-10-09 ENCOUNTER — Other Ambulatory Visit: Payer: Self-pay

## 2018-10-09 MED ORDER — ONDANSETRON HCL 4 MG PO TABS: 4.0000 mg | ORAL_TABLET | Freq: Every day | ORAL | 1 refills | Status: DC | PRN

## 2018-10-09 MED ORDER — METOPROLOL SUCCINATE ER 100 MG PO TB24: ORAL_TABLET | ORAL | 2 refills | Status: DC

## 2018-10-09 NOTE — Telephone Encounter (Signed)
Pt called she switched her prescriptions from [facility] to [facility] on [address]. She needs new prescriptions on Metoprolol 100 MG 90 pills and ondansetron 4 mg 90 pills. She is almost out of all of them.

## 2018-10-09 NOTE — Telephone Encounter (Signed)
Prescriptions refilled and sent to [facility]

## 2018-10-14 ENCOUNTER — Telehealth: Payer: Self-pay | Admitting: *Deleted

## 2018-10-14 ENCOUNTER — Emergency Department (HOSPITAL_COMMUNITY)
Admission: EM | Admit: 2018-10-14 | Discharge: 2018-10-14 | Disposition: A | Discharge status: Home / Self Care | Payer: Medicare Other | Attending: Emergency Medicine | Admitting: Emergency Medicine

## 2018-10-14 ENCOUNTER — Other Ambulatory Visit: Payer: Self-pay

## 2018-10-14 ENCOUNTER — Emergency Department (HOSPITAL_COMMUNITY): Payer: Medicare Other

## 2018-10-14 ENCOUNTER — Encounter (HOSPITAL_COMMUNITY): Payer: Self-pay

## 2018-10-14 DIAGNOSIS — Z79899 Other long term (current) drug therapy: Secondary | ICD-10-CM | POA: Insufficient documentation

## 2018-10-14 DIAGNOSIS — R05 Cough: Secondary | ICD-10-CM | POA: Diagnosis not present

## 2018-10-14 DIAGNOSIS — I493 Ventricular premature depolarization: Secondary | ICD-10-CM | POA: Diagnosis not present

## 2018-10-14 DIAGNOSIS — R059 Cough, unspecified: Secondary | ICD-10-CM

## 2018-10-14 DIAGNOSIS — J449 Chronic obstructive pulmonary disease, unspecified: Secondary | ICD-10-CM | POA: Insufficient documentation

## 2018-10-14 DIAGNOSIS — N184 Chronic kidney disease, stage 4 (severe): Secondary | ICD-10-CM | POA: Insufficient documentation

## 2018-10-14 DIAGNOSIS — Z95810 Presence of automatic (implantable) cardiac defibrillator: Secondary | ICD-10-CM | POA: Diagnosis not present

## 2018-10-14 DIAGNOSIS — Z87891 Personal history of nicotine dependence: Secondary | ICD-10-CM | POA: Diagnosis not present

## 2018-10-14 DIAGNOSIS — E1122 Type 2 diabetes mellitus with diabetic chronic kidney disease: Secondary | ICD-10-CM | POA: Diagnosis not present

## 2018-10-14 DIAGNOSIS — I5032 Chronic diastolic (congestive) heart failure: Secondary | ICD-10-CM | POA: Insufficient documentation

## 2018-10-14 DIAGNOSIS — J40 Bronchitis, not specified as acute or chronic: Secondary | ICD-10-CM | POA: Diagnosis not present

## 2018-10-14 DIAGNOSIS — I130 Hypertensive heart and chronic kidney disease with heart failure and stage 1 through stage 4 chronic kidney disease, or unspecified chronic kidney disease: Secondary | ICD-10-CM | POA: Insufficient documentation

## 2018-10-14 DIAGNOSIS — E039 Hypothyroidism, unspecified: Secondary | ICD-10-CM | POA: Insufficient documentation

## 2018-10-14 DIAGNOSIS — Z7901 Long term (current) use of anticoagulants: Secondary | ICD-10-CM | POA: Diagnosis not present

## 2018-10-14 LAB — BASIC METABOLIC PANEL
Anion gap: 10 (ref 5–15)
BUN: 44 mg/dL — ABNORMAL HIGH (ref 8–23)
CO2: 29 mmol/L (ref 22–32)
Calcium: 9.1 mg/dL (ref 8.9–10.3)
Chloride: 89 mmol/L — ABNORMAL LOW (ref 98–111)
Creatinine, Ser: 1.68 mg/dL — ABNORMAL HIGH (ref 0.44–1.00)
GFR calc Af Amer: 32 mL/min — ABNORMAL LOW (ref >60)
GFR calc non Af Amer: 28 mL/min — ABNORMAL LOW (ref >60)
Glucose, Bld: 141 mg/dL — ABNORMAL HIGH (ref 70–99)
Potassium: 3.9 mmol/L (ref 3.5–5.1)
Sodium: 128 mmol/L — ABNORMAL LOW (ref 135–145)

## 2018-10-14 LAB — CBC WITH DIFFERENTIAL/PLATELET
Abs Immature Granulocytes: 0.01 10*3/uL (ref 0.00–0.07)
Basophils Absolute: 0 10*3/uL (ref 0.0–0.1)
Basophils Relative: 0 %
Eosinophils Absolute: 0 10*3/uL (ref 0.0–0.5)
Eosinophils Relative: 0 %
HCT: 31.6 % — ABNORMAL LOW (ref 36.0–46.0)
Hemoglobin: 9.9 g/dL — ABNORMAL LOW (ref 12.0–15.0)
Immature Granulocytes: 0 %
Lymphocytes Relative: 15 %
Lymphs Abs: 0.7 10*3/uL (ref 0.7–4.0)
MCH: 28.9 pg (ref 26.0–34.0)
MCHC: 31.3 g/dL (ref 30.0–36.0)
MCV: 92.1 fL (ref 80.0–100.0)
Monocytes Absolute: 0.4 10*3/uL (ref 0.1–1.0)
Monocytes Relative: 9 %
Neutro Abs: 3.4 10*3/uL (ref 1.7–7.7)
Neutrophils Relative %: 76 %
Platelets: 163 10*3/uL (ref 150–400)
RBC: 3.43 MIL/uL — ABNORMAL LOW (ref 3.87–5.11)
RDW: 15.9 % — ABNORMAL HIGH (ref 11.5–15.5)
WBC: 4.5 10*3/uL (ref 4.0–10.5)
nRBC: 0 % (ref 0.0–0.2)

## 2018-10-14 MED ORDER — SODIUM CHLORIDE 0.9 % IV BOLUS: 500.0000 mL | Freq: Once | INTRAVENOUS | Status: DC

## 2018-10-14 MED ORDER — SODIUM CHLORIDE 0.9 % IV BOLUS
500.0000 mL | Freq: Once | INTRAVENOUS | Status: AC
  Administered 2018-10-14: 500 mL via INTRAVENOUS

## 2018-10-14 MED ORDER — CEPHALEXIN 500 MG PO CAPS: 500.0000 mg | ORAL_CAPSULE | Freq: Four times a day (QID) | ORAL | 0 refills | Status: DC

## 2018-10-14 NOTE — Telephone Encounter (Signed)
Spoke with patient, she is aware of recommendation but states, "I don't know if I will get out today in this cold weather"

## 2018-10-14 NOTE — Telephone Encounter (Signed)
Please advise 

## 2018-10-14 NOTE — ED Triage Notes (Signed)
Pt been coughing since Friday. Seen by Dr [name] today and advised to come to ED to rule out. Pt reports some aching but no increase in SOB . Wears O2 at 1 L at home

## 2018-10-14 NOTE — ED Provider Notes (Signed)
[facility] EMERGENCY DEPARTMENT Provider Note   CSN: [id number] Arrival date & time: 10/14/18  1430     History   Chief Complaint Chief Complaint  Patient presents with  . Cough    HPI [name] [name] is a 85 y.o. female.  Complain of a cough for a number days.  Nonproductive.  She was sent to the emergency department by her family physician  The history is provided by the patient. No language interpreter was used.  Cough  Cough characteristics:  Non-productive Sputum characteristics:  Nondescript Severity:  Moderate Onset quality:  Sudden Timing:  Constant Progression:  Worsening Chronicity:  New Smoker: no   Worsened by:  Nothing Associated symptoms: no chest pain, no eye discharge, no headaches and no rash     Past Medical History:  Diagnosis Date  . Adenomatous polyp of colon 12/16/2002   Dr. [name]/[facility]  . Allergy   . Cataract   . CHB (complete heart block) ([place]) 10/07/2014      . CHF (congestive heart failure) ([place])    a.  EF previously reduced to 25-30% b. EF improved to 60-65% by echo in 10/2017.   Marland Kitchen Chronic kidney disease, stage I 2006  . Constipation       . COPD (chronic obstructive pulmonary disease) ([place])       . DJD (degenerative joint disease) of lumbar spine   . DM (diabetes mellitus) ([place]) 2006  . Esophageal reflux   . Glaucoma   . Gout       . H/O heart valve replacement with mechanical valve    a. s/p mechanical AVR in 2001 and mechanical MVR in 2004.  Marland Kitchen H/O wisdom tooth extraction   . Hyperlipemia   . IDA (iron deficiency anemia)    Parenteral iron/[name]  . Insomnia       . Non-ischemic cardiomyopathy ([place])    a. s/p MDT CRTD  . Obesity, unspecified   . Other and unspecified hyperlipidemia   . Oxygen deficiency 2014   nocturnal;  . Spondylolisthesis   . Spondylosis   . Thyroid cancer ([place])    remote thyroidectomy, no recurrence, pt denies in 2016 thyroid cancer  . Unspecified  hypothyroidism       . 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 Vertigo         Patient Active Problem List   Diagnosis Date Noted  . Hyponatremia 09/27/2018  . H/O mitral valve replacement with mechanical valve 08/02/2018  . Chronic right hip pain 04/16/2018  . Trochanteric bursitis, right hip 03/07/2017  . Persistent atrial fibrillation   . Nocturnal hypoxia 10/08/2016  . Dependence on nocturnal oxygen therapy 10/08/2016  . S/P ICD (internal cardiac defibrillator) procedure 02/08/2015  . CHB (complete heart block) ([place]) 10/07/2014  . Fatigue 07/20/2014  . Absolute anemia 06/24/2014  . Osteopenia 02/10/2014  . Encounter for therapeutic drug monitoring 10/29/2013  . Spinal stenosis of lumbar region 02/19/2013  . OA (osteoarthritis) of knee 02/19/2013  . Chronic pain of right knee 01/02/2013  . Hemolytic anemia ([place]) 09/24/2012  . H/O adenomatous polyp of colon 05/24/2012  . High risk medication use 05/24/2012  . COPD (chronic obstructive pulmonary disease) ([place]) 05/19/2012  . Chronic diastolic heart failure ([place]) 05/19/2012  . Chronic anticoagulation, on coumadin for Mechanical AVR and MVR 04/01/2012  . Cardiorenal syndrome with renal failure 03/22/2012  . Hx of aortic valve replacement, mechanical 03/22/2012  . S/P MVR (mitral valve  replacement) 03/22/2012  . Biventricular automatic implantable cardioverter defibrillator in situ 03/22/2012  . CKD (chronic kidney disease), stage IV ([place]) 03/22/2012  . Warfarin-induced coagulopathy ([place]) 03/21/2012  . Papilloma of breast 03/06/2012  . Iron deficiency 10/04/2011  . Allergic rhinitis 02/14/2011  . Nausea without vomiting 06/07/2010  . HIP PAIN, RIGHT 04/23/2009  . VARICOSE VEINS LOWER EXTREMITIES W/OTH COMPS 02/09/2009  . Calculus of gallbladder with chronic cholecystitis without obstruction 02/09/2009  . Prediabetes 06/15/2008  . Hypothyroidism, postsurgical 02/24/2008  . Hyperlipidemia LDL goal <70  02/24/2008  . Mild obesity 02/24/2008  . Essential hypertension 02/24/2008  . Non-ischemic cardiomyopathy with ICD 02/24/2008  . GERD 02/24/2008  . DEGENERATIVE JOINT DISEASE, SPINE 02/24/2008    Past Surgical History:  Procedure Laterality Date  . AORTIC AND MITRAL VALVE REPLACEMENT  2001  . BI-VENTRICULAR IMPLANTABLE CARDIOVERTER DEFIBRILLATOR UPGRADE N/A 01/18/2015   Procedure: BI-VENTRICULAR IMPLANTABLE CARDIOVERTER DEFIBRILLATOR UPGRADE;  Surgeon: Evans Lance, MD;  Location: [facility] CATH LAB;  Service: Cardiovascular;  Laterality: N/A;  . CARDIAC CATHETERIZATION    . CARDIAC VALVE REPLACEMENT    . CARDIOVERSION N/A 11/13/2016   Procedure: CARDIOVERSION;  Surgeon: Sanda Klein, MD;  Location: [facility] ENDOSCOPY;  Service: Cardiovascular;  Laterality: N/A;  . COLONOSCOPY  06/12/2007   Rourk- Normal rectum/Normal colon  . COLONOSCOPY  12/16/02   small hemorrhoids  . COLONOSCOPY  06/20/2012   Procedure: COLONOSCOPY;  Surgeon: Daneil Dolin, MD;  Location: [facility] ENDO SUITE;  Service: Endoscopy;  Laterality: N/A;  10:45  . defibrillator implanted 2006    . DOPPLER ECHOCARDIOGRAPHY  2012  . DOPPLER ECHOCARDIOGRAPHY  05,06,07,08,09,2011  . ESOPHAGOGASTRODUODENOSCOPY  06/12/2007   Rourk- normal esophagus, small hiatal hernia, otherwise normal stomach, D1, D2  . EYE SURGERY Right 2005  . EYE SURGERY Left 2006  . ICD LEAD REMOVAL Left 02/08/2015   Procedure: ICD LEAD REMOVAL;  Surgeon: Evans Lance, MD;  Location: [facility] OR;  Service: Cardiovascular;  Laterality: Left;  "Will plan extraction and insertion of a BiV PM"  **Dr. Roxan Hockey backing up case**  . IMPLANTABLE CARDIOVERTER DEFIBRILLATOR (ICD) GENERATOR CHANGE Left 02/08/2015   Procedure: ICD GENERATOR CHANGE;  Surgeon: Evans Lance, MD;  Location: [facility];  Service: Cardiovascular;  Laterality: Left;  . INSERT / REPLACE / REMOVE PACEMAKER    . PACEMAKER INSERTION  May 2016  . pacemaker placed  2004  . right breast cyst removed     benign     . right cataract removed     2005  . TEE WITHOUT CARDIOVERSION N/A 11/13/2016   Procedure: TRANSESOPHAGEAL ECHOCARDIOGRAM (TEE);  Surgeon: Sanda Klein, MD;  Location: [facility] ENDOSCOPY;  Service: Cardiovascular;  Laterality: N/A;  . THYROIDECTOMY       OB History    Gravida      Para      Term      Preterm      AB      Living  0     SAB      TAB      Ectopic      Multiple      Live Births               Home Medications    Prior to Admission medications   Medication Sig Start Date End Date Taking? Authorizing Provider  acetaminophen (TYLENOL) 500 MG tablet Take 500 mg by mouth every 6 (six) hours as needed (pain).   Yes [provider]  albuterol (PROVENTIL HFA;VENTOLIN HFA)  108 (90 Base) MCG/ACT inhaler INHALE 2 PUFFS INTO THE LUNGS EVERY 6 HOURS AS NEEDED FOR WHEEZING OR SHORTNESS OF BREATH Patient taking differently: Inhale 2 puffs into the lungs every 6 (six) hours as needed for wheezing or shortness of breath.  09/30/18  Yes Fayrene Helper, MD  albuterol (PROVENTIL) (2.5 MG/3ML) 0.083% nebulizer solution INHALE 1 VIAL VIA NEBULIZER THREE TIMES DAILY. Patient taking differently: Take 2.5 mg by nebulization 2 (two) times daily.  12/28/16  Yes Fayrene Helper, MD  allopurinol (ZYLOPRIM) 100 MG tablet Take 1 tablet (100 mg total) by mouth daily. Patient taking differently: Take 100 mg by mouth every evening.  08/05/18  Yes Fayrene Helper, MD  calcium carbonate (OS-CAL) 1250 (500 Ca) MG chewable tablet Chew 1 tablet by mouth daily. 07/15/10  Yes [provider]  COUMADIN 5 MG tablet Take 2 tablets daily except 1 tablet on Tuesdays and Saturdays Patient taking differently: Take 5-10 mg by mouth See admin instructions. Take 2 tablets daily except 1 tablet on Tuesdays and Saturdays 08/12/18  Yes Croitoru, Mihai, MD  docusate sodium (COLACE) 100 MG capsule Take 300 mg by mouth at bedtime.   Yes [provider]  fluticasone (FLONASE)  50 MCG/ACT nasal spray Place 2 sprays into both nostrils daily. Pt needs now Patient taking differently: Place 2 sprays into both nostrils daily.  08/15/18  Yes Fayrene Helper, MD  folic acid (FOLVITE) 1 MG tablet Take 1 tablet (1 mg total) by mouth daily. 09/30/18  Yes Fayrene Helper, MD  furosemide (LASIX) 40 MG tablet Take 1 tablet (40 mg total) by mouth daily. Skip dose today and start from 1/4 Patient taking differently: Take 40 mg by mouth daily.  09/28/18  Yes Dhungel, Nishant, MD  gabapentin (NEURONTIN) 300 MG capsule TAKE 1 CAPSULE BY MOUTH AT BEDTIME Patient taking differently: Take 300 mg by mouth at bedtime.  08/06/18  Yes Fayrene Helper, MD  guaiFENesin (MUCINEX) 600 MG 12 hr tablet Take 1 tablet (600 mg total) by mouth 2 (two) times daily. Patient taking differently: Take 600 mg by mouth at bedtime.  11/17/17 11/17/18 Yes Kathie Dike, MD  hydrALAZINE (APRESOLINE) 25 MG tablet TAKE 1 TABLET THREE TIMES A DAY Patient taking differently: Take 25 mg by mouth 3 (three) times daily.  09/23/18  Yes Fayrene Helper, MD  HYDROcodone-acetaminophen (NORCO) 7.5-325 MG tablet Take 1 tablet by mouth every 6 (six) hours as needed for moderate pain (Must last 30 days.). 08/15/18  Yes Sanjuana Kava, MD  isosorbide mononitrate (IMDUR) 60 MG 24 hr tablet Take 1 tablet (60 mg total) by mouth daily. 09/30/18  Yes Fayrene Helper, MD  metolazone (ZAROXOLYN) 2.5 MG tablet Take 1 tablet (2.5 mg total) by mouth 2 (two) times a week. Wednesdays and Saturdays  Skip dose on 1/4 and start from 1/8 Patient taking differently: Take 2.5 mg by mouth 2 (two) times a week. Wednesdays and Saturdays 10/02/18  Yes Dhungel, Nishant, MD  metoprolol succinate (TOPROL-XL) 100 MG 24 hr tablet TAKE 1 TABLET BY MOUTH DAILY. TAKE WITH OR IMMEDIATELY FOLLOWING A MEAL. Patient taking differently: Take 100 mg by mouth every evening. TAKE WITH OR IMMEDIATELY FOLLOWING A MEAL. 10/09/18  Yes Fayrene Helper, MD   Multiple Vitamin (MULTIVITAMIN) LIQD Take 5 mLs by mouth daily.   Yes [provider]  ondansetron (ZOFRAN) 4 MG tablet Take 1 tablet (4 mg total) by mouth daily as needed for nausea or vomiting. 10/09/18  Yes  Fayrene Helper, MD  potassium chloride SA (KLOR-CON M20) 20 MEQ tablet Take 1 tablet by mouth  daily 09/05/18  Yes Fayrene Helper, MD  pravastatin (PRAVACHOL) 40 MG tablet TAKE 1 TABLET BY MOUTH EVERY EVENING Patient taking differently: Take 40 mg by mouth at bedtime.  08/07/18  Yes Croitoru, Mihai, MD  Propylene Glycol (SYSTANE BALANCE) 0.6 % SOLN Apply 1-2 drops to eye daily as needed (for dry eye relief).   Yes [provider]  SYNTHROID 75 MCG tablet TAKE 1 TABLET DAILY Patient taking differently: Take 75 mcg by mouth daily before breakfast.  08/05/18  Yes Fayrene Helper, MD  cephALEXin (KEFLEX) 500 MG capsule Take 1 capsule (500 mg total) by mouth 4 (four) times daily. 10/14/18   Milton Ferguson, MD    Family History Family History  Problem Relation Age of Onset  . Pancreatic cancer Mother   . Heart disease Father   . Heart disease Sister   . Heart attack Sister   . Heart disease Brother   . Diabetes Brother   . Heart disease Brother   . Diabetes Brother   . Hypertension Brother   . Lung cancer Brother     Social History Social History   Tobacco Use  . Smoking status: Former Smoker    Packs/day: 0.75    Years: 40.00    Pack years: 30.00    Types: Cigarettes    Last attempt to quit: 03/08/1987    Years since quitting: 31.6  . Smokeless tobacco: Never Used  Substance Use Topics  . Alcohol use: No    Alcohol/week: 0.0 standard drinks  . Drug use: No     Allergies   Penicillins; Aspirin; Fentanyl; and Niacin   Review of Systems Review of Systems  Constitutional: Negative for appetite change and fatigue.  HENT: Negative for congestion, ear discharge and sinus pressure.   Eyes: Negative for discharge.  Respiratory: Positive for  cough.   Cardiovascular: Negative for chest pain.  Gastrointestinal: Negative for abdominal pain and diarrhea.  Genitourinary: Negative for frequency and hematuria.  Musculoskeletal: Negative for back pain.  Skin: Negative for rash.  Neurological: Negative for seizures and headaches.  Psychiatric/Behavioral: Negative for hallucinations.  Patient   Physical Exam Updated Vital Signs BP 131/70   Pulse 69   Temp 98.3 F (36.8 C) (Oral)   Resp 18   Ht 5\' 7"  (1.702 m)   Wt 88.2 kg   SpO2 99%   BMI 30.44 kg/m   Physical Exam Vitals signs and nursing note reviewed.  Constitutional:      Appearance: She is well-developed.  HENT:     Head: Normocephalic.     Nose: Nose normal.  Eyes:     General: No scleral icterus.    Conjunctiva/sclera: Conjunctivae normal.  Neck:     Musculoskeletal: Neck supple.     Thyroid: No thyromegaly.  Cardiovascular:     Rate and Rhythm: Normal rate and regular rhythm.     Heart sounds: No murmur. No friction rub. No gallop.   Pulmonary:     Breath sounds: No stridor. No wheezing or rales.  Chest:     Chest wall: No tenderness.  Abdominal:     General: There is no distension.     Tenderness: There is no abdominal tenderness. There is no rebound.  Musculoskeletal: Normal range of motion.  Lymphadenopathy:     Cervical: No cervical adenopathy.  Skin:    Findings: No erythema or rash.  Neurological:     Mental Status: She is oriented to person, place, and time.     Motor: No abnormal muscle tone.     Coordination: Coordination normal.  Psychiatric:        Behavior: Behavior normal.      ED Treatments / Results  Labs (all labs ordered are listed, but only abnormal results are displayed) Labs Reviewed  CBC WITH DIFFERENTIAL/PLATELET - Abnormal; Notable for the following components:      Result Value   RBC 3.43 (*)    Hemoglobin 9.9 (*)    HCT 31.6 (*)    RDW 15.9 (*)    All other components within normal limits  BASIC METABOLIC  PANEL - Abnormal; Notable for the following components:   Sodium 128 (*)    Chloride 89 (*)    Glucose, Bld 141 (*)    BUN 44 (*)    Creatinine, Ser 1.68 (*)    GFR calc non Af Amer 28 (*)    GFR calc Af Amer 32 (*)    All other components within normal limits    EKG None  Radiology Dg Chest 2 View  Result Date: 10/14/2018 CLINICAL DATA:  Cough for several days EXAM: CHEST - 2 VIEW COMPARISON:  09/26/2018 FINDINGS: Cardiac shadow is enlarged. Postsurgical changes are noted. Defibrillator is again noted and stable. Aortic calcifications are seen. The lungs are hyperinflated consistent with COPD. No focal infiltrate or sizable effusion is noted. No acute bony abnormality is seen. IMPRESSION: No acute abnormality identified. Aortic Atherosclerosis (ICD10-I70.0) and Emphysema (ICD10-J43.9). Electronically Signed   By: Inez Catalina M.D.   On: 10/14/2018 16:07    Procedures Procedures (including critical care time)  Medications Ordered in ED Medications  sodium chloride 0.9 % bolus 500 mL (500 mLs Intravenous New Bag/Given 10/14/18 1559)     Initial Impression / Assessment and Plan / ED Course  I have reviewed the triage vital signs and the nursing notes.  Pertinent labs & imaging results that were available during my care of the patient were reviewed by me and considered in my medical decision making (see chart for details).     Patient with bronchitis.  She will be placed on Keflex and follow-up with PCP  Final Clinical Impressions(s) / ED Diagnoses   Final diagnoses:  Cough    ED Discharge Orders         Ordered    cephALEXin (KEFLEX) 500 MG capsule  4 times daily     10/14/18 1700           Milton Ferguson, MD 10/14/18 1701

## 2018-10-14 NOTE — Telephone Encounter (Signed)
I recommend she go to the ED based on symptoms she is calling with , pls let her know,

## 2018-10-14 NOTE — Discharge Instructions (Addendum)
Follow-up with your doctor next week if not improving °

## 2018-10-14 NOTE — Telephone Encounter (Signed)
Pt called wanting something called in. She has had a fever but doesn't know how high it was. She has a lot of coughing. No mucous with the cough. She hurts all over. This all started 3 days ago.

## 2018-10-15 ENCOUNTER — Ambulatory Visit: Payer: Medicare Other | Admitting: Orthopaedic Surgery

## 2018-10-17 IMAGING — DX DG CHEST 2V
2 series · 2 of 2 positions shown · non-contrast
Comparison: November 15, 2017

CLINICAL DATA: Lower extremity edema

EXAM:
CHEST - 2 VIEW

[chest lat]
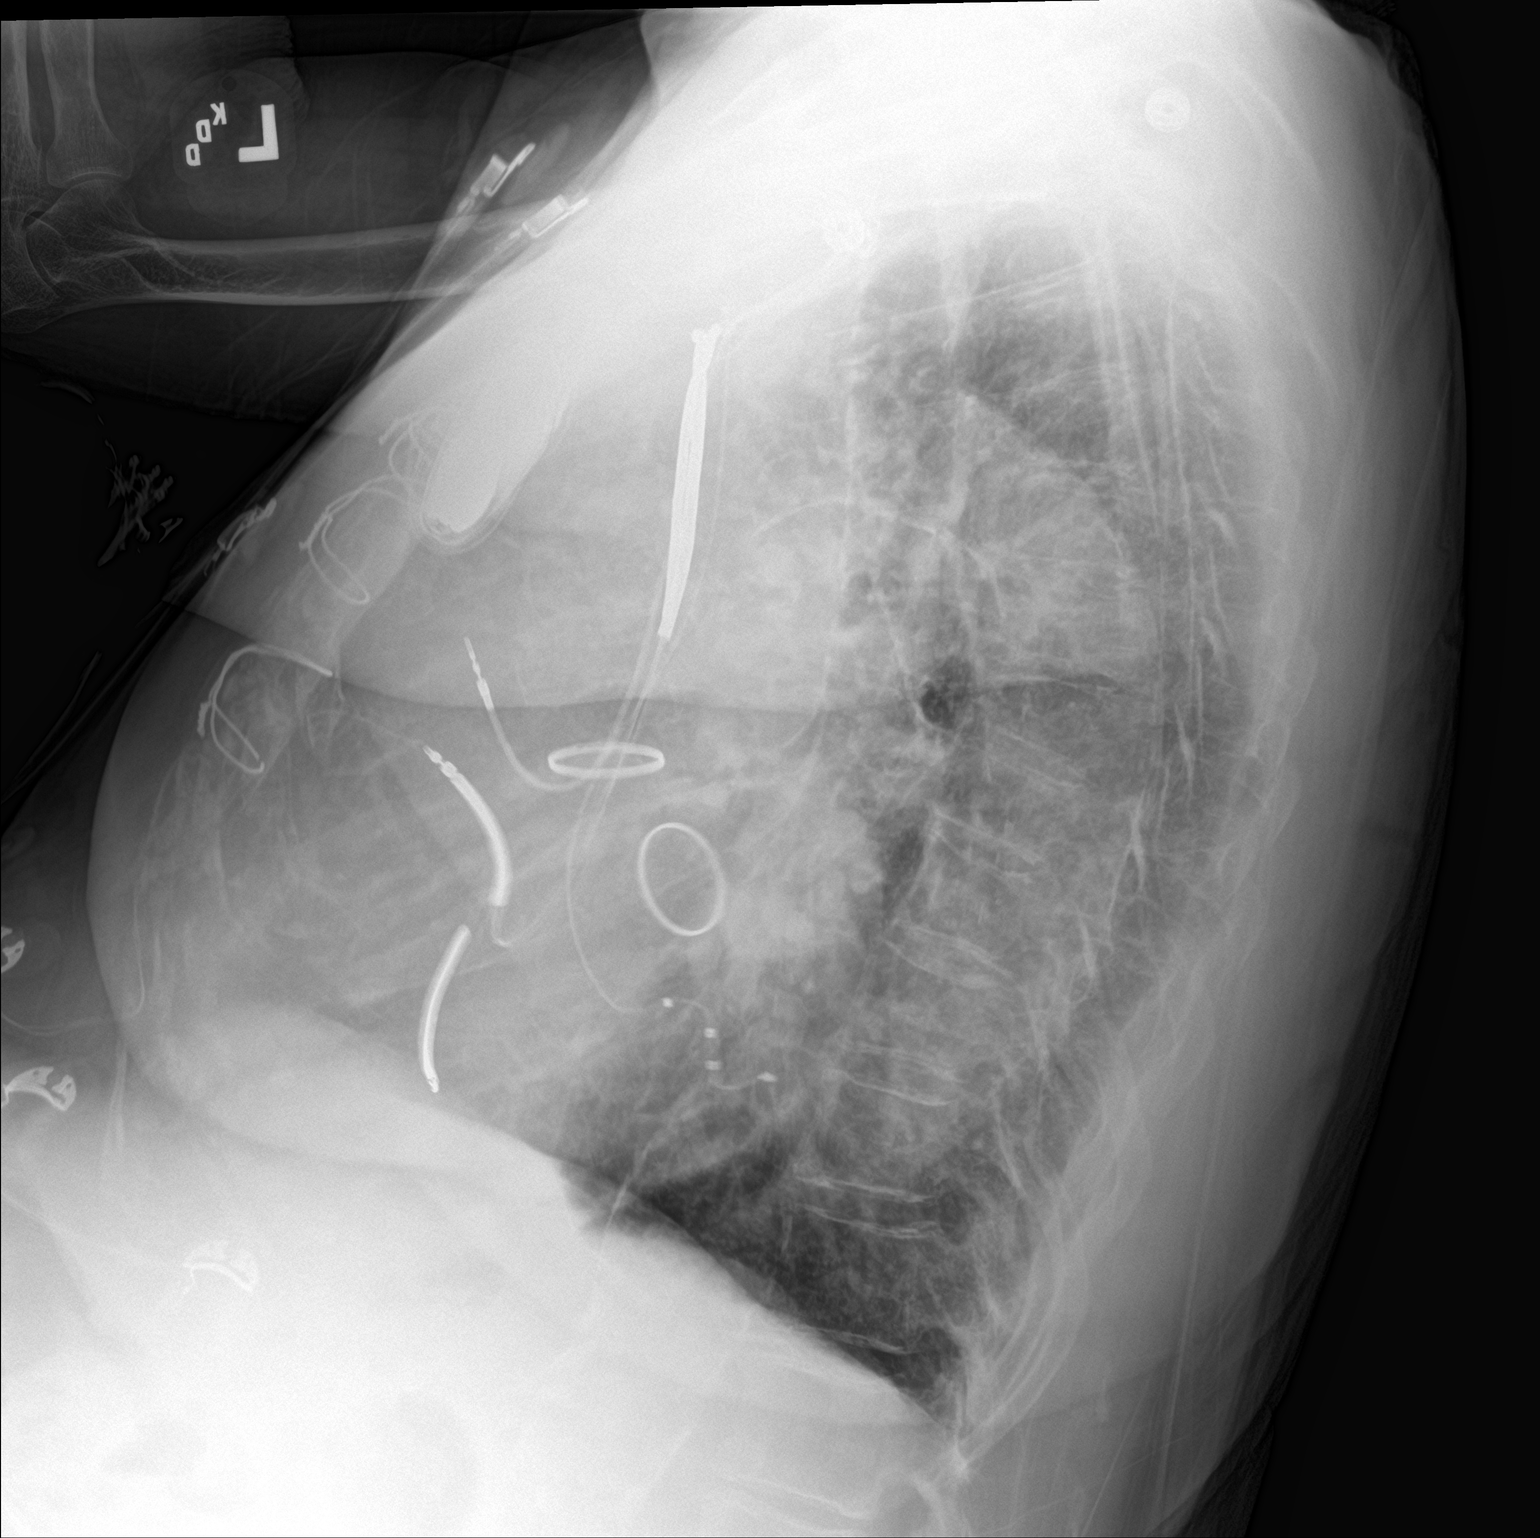

[chest ap]
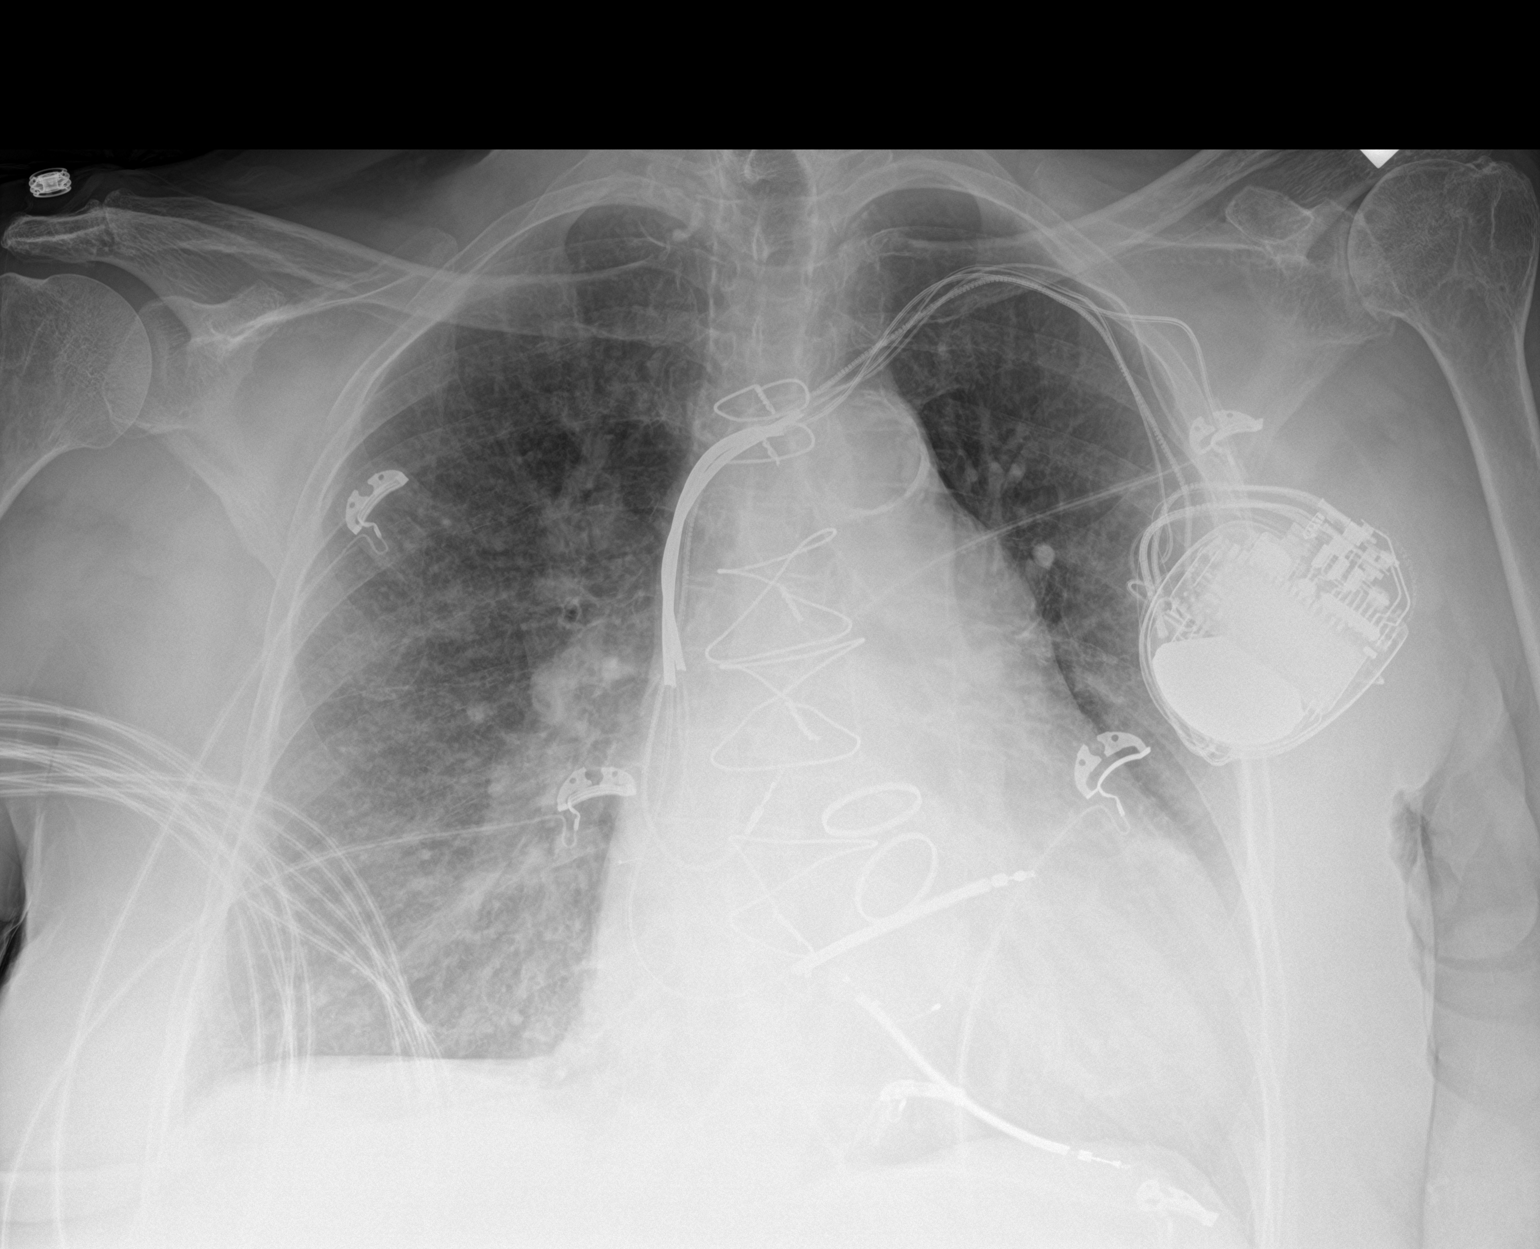

[2 of 2 positions shown; findings below may reference images not displayed]

FINDINGS: There is no edema or consolidation. Heart is mildly enlarged with
pulmonary vascularity within normal limits. There is aortic
atherosclerosis. There are pacemaker leads attached to the right
atrium, middle cardiac vein, and coronary sinus. There are mitral
and aortic valve replacements. There is no evident adenopathy. No
bone lesions.
IMPRESSION: Cardiomegaly with postoperative changes as noted. Aortic
atherosclerosis present. No edema or consolidation.

Aortic Atherosclerosis ([id number]-ZKO.O).

## 2018-10-18 IMAGING — NM NM PULMONARY VENT & PERF
16 series · 16 of 16 positions shown · non-contrast
Comparison: Chest x-ray 12/09/2016

CLINICAL DATA: Shortness of breath for 4 days

EXAM:
NUCLEAR MEDICINE VENTILATION - PERFUSION LUNG SCAN
TECHNIQUE: Ventilation images were obtained in multiple projections using
inhaled aerosol 2c-KKm DTPA. Perfusion images were obtained in
multiple projections after intravenous injection of Pc-DDm-BII.
RADIOPHARMACEUTICALS:  29.8 mCi of 2c-KKm DTPA aerosol inhalation
and 4.4 mCi IcBBm-V11 IV

[Series 1: ant/post vent · 4.14mm/px · 1 of 1 slices shown (1 of 2)]
[im 1/1]
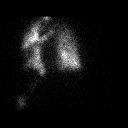

[Series 1: ant/post vent · 4.14mm/px · 1 of 1 slices shown (2 of 2)]
[im 1/1]
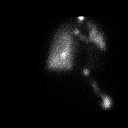

[Series 2: lao/rpo vent · 4.14mm/px · 1 of 1 slices shown (1 of 2)]
[im 1/1]
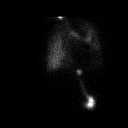

[Series 2: lao/rpo vent · 4.14mm/px · 1 of 1 slices shown (2 of 2)]
[im 1/1  full-range]
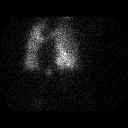

[Series 3: lt lat/rt lat vent · 4.14mm/px · 1 of 1 slices shown (1 of 2)]
[im 1/1]
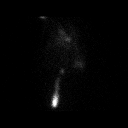

[Series 3: lt lat/rt lat vent · 4.14mm/px · 1 of 1 slices shown (2 of 2)]
[im 1/1  full-range]
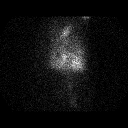

[Series 4: lpo/rao vent · 4.14mm/px · 1 of 1 slices shown (1 of 2)]
[im 1/1]
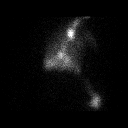

[Series 4: lpo/rao vent · 4.14mm/px · 1 of 1 slices shown (2 of 2)]
[im 1/1]
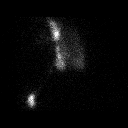

[Series 5: ant/post perf · 4.14mm/px · 1 of 1 slices shown (1 of 2)]
[im 1/1]
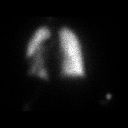

[Series 5: ant/post perf · 4.14mm/px · 1 of 1 slices shown (2 of 2)]
[im 1/1]
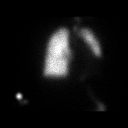

[Series 6: lao/rpo perf · 4.14mm/px · 1 of 1 slices shown (1 of 2)]
[im 1/1]
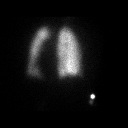

[Series 6: lao/rpo perf · 4.14mm/px · 1 of 1 slices shown (2 of 2)]
[im 1/1]
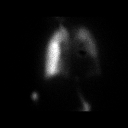

[Series 7: lt lat/rt lat perf · 4.14mm/px · 1 of 1 slices shown (1 of 2)]
[im 1/1]
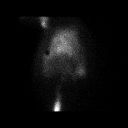

[Series 7: lt lat/rt lat perf · 4.14mm/px · 1 of 1 slices shown (2 of 2)]
[im 1/1]
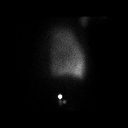

[Series 8: lpo/rao perf · 4.14mm/px · 1 of 1 slices shown (1 of 2)]
[im 1/1]
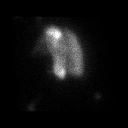

[Series 8: lpo/rao perf · 4.14mm/px · 1 of 1 slices shown (2 of 2)]
[im 1/1]
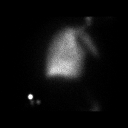

[16 of 16 positions shown; findings below may reference images not displayed]

FINDINGS: Ventilation: Cardiac enlargement but no significant ventilation
defects.

Perfusion: No wedge shaped peripheral perfusion defects to suggest
acute pulmonary embolism.
IMPRESSION: Negative V/Q scan for pulmonary embolism.

## 2018-10-23 ENCOUNTER — Telehealth: Payer: Self-pay | Admitting: Family Medicine

## 2018-10-23 LAB — CUP PACEART INCLINIC DEVICE CHECK
Date Time Interrogation Session: 20200129161138
Implantable Lead Implant Date: 20040521
Implantable Lead Implant Date: 20071218
Implantable Lead Implant Date: 20160516
Implantable Lead Location: 753858
Implantable Lead Location: 753859
Implantable Lead Location: 753860
Implantable Lead Model: 5076
Implantable Lead Model: 6947
Implantable Pulse Generator Implant Date: 20160516

## 2018-10-23 NOTE — Telephone Encounter (Signed)
noted 

## 2018-10-23 NOTE — Telephone Encounter (Signed)
FYI--Pt called to say she is doing better

## 2018-10-24 ENCOUNTER — Inpatient Hospital Stay (HOSPITAL_COMMUNITY): Payer: Medicare Other

## 2018-10-24 ENCOUNTER — Other Ambulatory Visit (HOSPITAL_COMMUNITY)
Admission: RE | Admit: 2018-10-24 | Discharge: 2018-10-24 | Disposition: A | Discharge status: Home / Self Care | Payer: Medicare Other | Source: Ambulatory Visit | Attending: Nephrology | Admitting: Nephrology

## 2018-10-24 VITALS — BP 103/56 | HR 70 | Temp 97.6°F | Resp 18

## 2018-10-24 DIAGNOSIS — N183 Chronic kidney disease, stage 3 (moderate): Secondary | ICD-10-CM | POA: Diagnosis not present

## 2018-10-24 DIAGNOSIS — D649 Anemia, unspecified: Secondary | ICD-10-CM

## 2018-10-24 DIAGNOSIS — N184 Chronic kidney disease, stage 4 (severe): Secondary | ICD-10-CM

## 2018-10-24 DIAGNOSIS — D631 Anemia in chronic kidney disease: Secondary | ICD-10-CM | POA: Diagnosis not present

## 2018-10-24 DIAGNOSIS — D509 Iron deficiency anemia, unspecified: Secondary | ICD-10-CM | POA: Insufficient documentation

## 2018-10-24 DIAGNOSIS — I10 Essential (primary) hypertension: Secondary | ICD-10-CM | POA: Insufficient documentation

## 2018-10-24 DIAGNOSIS — E559 Vitamin D deficiency, unspecified: Secondary | ICD-10-CM | POA: Insufficient documentation

## 2018-10-24 DIAGNOSIS — R809 Proteinuria, unspecified: Secondary | ICD-10-CM | POA: Insufficient documentation

## 2018-10-24 DIAGNOSIS — Z79899 Other long term (current) drug therapy: Secondary | ICD-10-CM | POA: Insufficient documentation

## 2018-10-24 LAB — RENAL FUNCTION PANEL
Albumin: 3.9 g/dL (ref 3.5–5.0)
Anion gap: 9 (ref 5–15)
BUN: 64 mg/dL — ABNORMAL HIGH (ref 8–23)
CO2: 30 mmol/L (ref 22–32)
Calcium: 9.2 mg/dL (ref 8.9–10.3)
Chloride: 92 mmol/L — ABNORMAL LOW (ref 98–111)
Creatinine, Ser: 1.98 mg/dL — ABNORMAL HIGH (ref 0.44–1.00)
GFR calc Af Amer: 26 mL/min — ABNORMAL LOW (ref >60)
GFR calc non Af Amer: 23 mL/min — ABNORMAL LOW (ref >60)
Glucose, Bld: 97 mg/dL (ref 70–99)
Phosphorus: 4.5 mg/dL (ref 2.5–4.6)
Potassium: 4 mmol/L (ref 3.5–5.1)
Sodium: 131 mmol/L — ABNORMAL LOW (ref 135–145)

## 2018-10-24 LAB — CBC WITH DIFFERENTIAL/PLATELET
Abs Immature Granulocytes: 0.02 10*3/uL (ref 0.00–0.07)
Basophils Absolute: 0 10*3/uL (ref 0.0–0.1)
Basophils Relative: 0 %
Eosinophils Absolute: 0 10*3/uL (ref 0.0–0.5)
Eosinophils Relative: 0 %
HCT: 31.8 % — ABNORMAL LOW (ref 36.0–46.0)
Hemoglobin: 9.8 g/dL — ABNORMAL LOW (ref 12.0–15.0)
Immature Granulocytes: 0 %
Lymphocytes Relative: 13 %
Lymphs Abs: 0.7 10*3/uL (ref 0.7–4.0)
MCH: 28.6 pg (ref 26.0–34.0)
MCHC: 30.8 g/dL (ref 30.0–36.0)
MCV: 92.7 fL (ref 80.0–100.0)
Monocytes Absolute: 0.6 10*3/uL (ref 0.1–1.0)
Monocytes Relative: 10 %
Neutro Abs: 4.2 10*3/uL (ref 1.7–7.7)
Neutrophils Relative %: 77 %
Platelets: 188 10*3/uL (ref 150–400)
RBC: 3.43 MIL/uL — ABNORMAL LOW (ref 3.87–5.11)
RDW: 15.7 % — ABNORMAL HIGH (ref 11.5–15.5)
WBC: 5.6 10*3/uL (ref 4.0–10.5)
nRBC: 0 % (ref 0.0–0.2)

## 2018-10-24 LAB — PROTEIN / CREATININE RATIO, URINE
Creatinine, Urine: 53.67 mg/dL
Total Protein, Urine: <6 mg/dL

## 2018-10-24 LAB — IRON AND TIBC
Iron: 50 ug/dL (ref 28–170)
Saturation Ratios: 18 % (ref 10.4–31.8)
TIBC: 285 ug/dL (ref 250–450)
UIBC: 235 ug/dL

## 2018-10-24 LAB — FERRITIN: Ferritin: 619 ng/mL — ABNORMAL HIGH (ref 11–307)

## 2018-10-24 MED ORDER — EPOETIN ALFA 40000 UNIT/ML IJ SOLN
40000.0000 [IU] | Freq: Once | INTRAMUSCULAR | Status: AC
  Administered 2018-10-24: 40000 [IU] via SUBCUTANEOUS

## 2018-10-24 NOTE — Patient Instructions (Signed)
[place] at [facility]  Discharge Instructions:   You received a Procrit injection today. _______________________________________________________________  Thank you for choosing [place] at [facility] to provide your oncology and hematology care.  To afford each patient quality time with our providers, please arrive at least 15 minutes before your scheduled appointment.  You need to re-schedule your appointment if you arrive 10 or more minutes late.  We strive to give you quality time with our providers, and arriving late affects you and other patients whose appointments are after yours.  Also, if you no show three or more times for appointments you may be dismissed from the clinic.  Again, thank you for choosing [place] at [place] hope is that these requests will allow you access to exceptional care and in a timely manner. _______________________________________________________________  If you have questions after your visit, please contact our office at [phone] between the hours of 8:30 a.m. and 5:00 p.m. Voicemails left after 4:30 p.m. will not be returned until the following business day. _______________________________________________________________  For prescription refill requests, have your pharmacy contact our office. _______________________________________________________________  Recommendations made by the consultant and any test results will be sent to your referring physician. _______________________________________________________________

## 2018-10-24 NOTE — Progress Notes (Signed)
[name] [name] tolerated Procrit injection without incident or complaint. VSS. Discharged via wheelchair in satisfactory condition in presence of family member.

## 2018-10-24 NOTE — Telephone Encounter (Signed)
noted 

## 2018-10-25 LAB — VITAMIN D 25 HYDROXY (VIT D DEFICIENCY, FRACTURES): Vit D, 25-Hydroxy: 37.4 ng/mL (ref 30.0–100.0)

## 2018-10-26 ENCOUNTER — Other Ambulatory Visit: Payer: Self-pay | Admitting: Adult Health

## 2018-10-26 LAB — PARATHYROID HORMONE, INTACT (NO CA): PTH: 71 pg/mL — ABNORMAL HIGH (ref 15–65)

## 2018-10-28 ENCOUNTER — Ambulatory Visit (INDEPENDENT_AMBULATORY_CARE_PROVIDER_SITE_OTHER): Payer: Medicare Other | Admitting: Pharmacist

## 2018-10-28 ENCOUNTER — Ambulatory Visit (INDEPENDENT_AMBULATORY_CARE_PROVIDER_SITE_OTHER): Payer: Medicare Other

## 2018-10-28 DIAGNOSIS — Z95810 Presence of automatic (implantable) cardiac defibrillator: Secondary | ICD-10-CM

## 2018-10-28 DIAGNOSIS — Z5181 Encounter for therapeutic drug level monitoring: Secondary | ICD-10-CM

## 2018-10-28 DIAGNOSIS — Z952 Presence of prosthetic heart valve: Secondary | ICD-10-CM

## 2018-10-28 DIAGNOSIS — I5032 Chronic diastolic (congestive) heart failure: Secondary | ICD-10-CM | POA: Diagnosis not present

## 2018-10-28 LAB — POCT INR: INR: 2.1 (ref 2.0–3.0)

## 2018-10-28 NOTE — Telephone Encounter (Signed)
Rx(s) sent to pharmacy electronically.  

## 2018-10-28 NOTE — Patient Instructions (Signed)
Description   Continue coumadin 2 tablets daily except 1 tablet on Tuesdays and Saturdays.  Continue greens  Recheck in 4 weeks.

## 2018-10-29 DIAGNOSIS — L851 Acquired keratosis [keratoderma] palmaris et plantaris: Secondary | ICD-10-CM | POA: Diagnosis not present

## 2018-10-29 DIAGNOSIS — L603 Nail dystrophy: Secondary | ICD-10-CM | POA: Diagnosis not present

## 2018-10-29 DIAGNOSIS — E1142 Type 2 diabetes mellitus with diabetic polyneuropathy: Secondary | ICD-10-CM | POA: Diagnosis not present

## 2018-10-29 NOTE — Progress Notes (Signed)
EPIC Encounter for ICM Monitoring  Patient Name: [name] [name] is a 85 y.o. female Date: 10/29/2018 Primary Care Physican: Fayrene Helper, MD Primary Cardiologist:Croitoru Electrophysiologist:Taylor Bi-V Pacing:99.7% Last Weight:194.6lbs Today's Weight:194 lbs   Clinical Status (26-Sep-2018 to 28-Oct-2018)  Treated VT/VF 0 episodes   AT/AF 1 episode   Time in AT/AF 24.0 hr/day (100.0%)   Longest AT/AF 32 days  Observations (2) (26-Sep-2018 to 28-Oct-2018)  AT/AF >= 6 hr for 32 days.  Patient Activity less than 1 hr/day for 4 weeks.   Heart failure questions reviewed.  She said her biggest complaint is lack of energy.  Thoracic impedance trending just below baseline normal.  Prescribed:Furosemide40 mg 1 tablet daily. Metolazaone 2.5 mg Take 1tablet twicea week (Wednesday and Saturdays). Potassium 20 mEq take 1 tablet daily.  Labs: 09/26/2018 Creatinine 1.45, BUN 40, Potassium 4.1, Sodium 129, eGFR 33-38 08/29/2018 Creatinine 1.85, BUN 49, Potassium 4.2, Sodium 134, eGFR 25-28 08/15/2018 Creatinine 1.63, BUN 43, Potassium 4.1, Sodium 135, eGFR 28-32 07/30/2018 Creatinine 1.58, BUN 49, Potassium 4.0, Sodium 135, eGFR 29-34 07/09/2018 Creatinine 1.60, BUN 48, Potassium 1.9, Sodium 136, eGFR 29-34 04/09/2018 Creatinine 1.91, BUN 53, Potassium 4.4, Sodium 135 03/30/2018 Creatinine 1.78, BUN 54, Potassium 3.9, Sodium 133, EGFR 25-29 12/11/2017 Creatinine1.89, BUN44, Potassium4.2, Sodium139, [id number] A complete set of results can be found in Results Review.  Recommendations:No changes.  Encouraged to call for fluid symptoms.  Follow-up plan: ICM clinic phone appointment on3/05/2019.  Copy of ICM check sent to Dr.Taylor and Dr Sallyanne Kuster.    3 month ICM trend: 10/28/2018    AT/AF   1 Year ICM trend:       [name], RN 10/29/2018 1:38 PM

## 2018-11-03 ENCOUNTER — Encounter: Payer: Self-pay | Admitting: Family Medicine

## 2018-11-03 DIAGNOSIS — Z09 Encounter for follow-up examination after completed treatment for conditions other than malignant neoplasm: Secondary | ICD-10-CM | POA: Insufficient documentation

## 2018-11-03 NOTE — Assessment & Plan Note (Signed)
Clinically stable, no current or recent cardiac decompennsaion

## 2018-11-03 NOTE — Assessment & Plan Note (Signed)
Repeat lab shows improvement and stability, she will continue diuretic therapy per cadiology

## 2018-11-03 NOTE — Progress Notes (Signed)
   [name] [name]     MRN: [id number]      DOB: [birth year]   HPI [name] [name] is here for follow up ofrecentnED visit on 01/02, when she presented with symptoms suggestive of decompensated heart failure. D/D was hyponatremia, she had cardiology eval in the ED as the original thought was that she would be admitted , but this was changed following card input with specifics as to management of her diuretics , and after a bolus of normal saline Her hospital course is reviewed, questions answered and medication reconciliation done States she fells " much better" She understands the need to follow medication  Instructions re diuretics , as outlined by Cardiology and to keep her f/u with them'  Questions re ED course were answered to patient's satfdisfaction  ROS Denies recent fever or chills. Denies sinus pressure, nasal congestion, ear pain or sore has chronic cough with occasional wheezing productive cough or wheezing. Denies any current chest pains, palpitations and leg swelling Denies abdominal pain, nausea, vomiting,diarrhea or constipation.   Denies dysuria, frequency, hesitancy or incontinence. C/o chronic  joint pain, swelling and limitation in mobility. Denies headaches, seizures, nDenies depression, anxiety or insomnia. Denies skin break down or rash.   PE  BP (!) 140/54   Pulse 71   Resp 15   Ht 5\' 7"  (1.702 m)   Wt 187 lb (84.8 kg)   SpO2 97% Comment: on 1 liter oxygen  BMI 29.29 kg/m   Patient alert and oriented and in no cardiopulmonary distress.  HEENT: No facial asymmetry, EOMI,   oropharynx pink and moist.  Neck supple no JVD, no mass.  Chest: Clear to auscultation bilaterally.  CVS: S1, S2 systolic murmurs, no S3.Regular rate.Click present  ABD: Soft non tender.   Ext: No edema  MS: Decreased  ROM spine, shoulders, hips and knees.  Skin: Intact, no ulcerations or rash noted.  Psych: Good eye contact, normal affect. Memory intact not anxious or depressed  appearing.  CNS: CN 2-12 intact, power,  normal throughout.no focal deficits noted.   Assessment & Plan  Encounter for examination following treatment at hospital Patient in for follow up of recent ED visit with dx of hyponatremia Discharge summary, and laboratory and radiology data are reviewed, and any questions or concerns are discussed. Specific issues requiring follow up are specifically addressed.   Hyperlipidemia LDL goal <70 Hyperlipidemia:Low fat diet discussed and encouraged.   Lipid Panel  Lab Results  Component Value Date   CHOL 170 07/09/2018   HDL 33 (L) 07/09/2018   LDLCALC 103 (H) 07/09/2018   TRIG 220 (H) 07/09/2018   CHOLHDL 5.2 (H) 07/09/2018  Updated lab needed at/ before next visit. Not at gal     Non-ischemic cardiomyopathy with ICD Clinically stable, no current or recent cardiac decompennsaion  Hyponatremia Repeat lab shows improvement and stability, she will continue diuretic therapy per cadiology  CKD (chronic kidney disease), stage IV (HCC) Followed by nephrology, fine balance between fluid restriction for heart failure and her CKD , has upcoming appt

## 2018-11-03 NOTE — Assessment & Plan Note (Signed)
Hyperlipidemia:Low fat diet discussed and encouraged.   Lipid Panel  Lab Results  Component Value Date   CHOL 170 07/09/2018   HDL 33 (L) 07/09/2018   LDLCALC 103 (H) 07/09/2018   TRIG 220 (H) 07/09/2018   CHOLHDL 5.2 (H) 07/09/2018  Updated lab needed at/ before next visit. Not at gal

## 2018-11-03 NOTE — Assessment & Plan Note (Signed)
Followed by nephrology, fine balance between fluid restriction for heart failure and her CKD , has upcoming appt

## 2018-11-03 NOTE — Assessment & Plan Note (Signed)
Patient in for follow up of recent ED visit with dx of hyponatremia Discharge summary, and laboratory and radiology data are reviewed, and any questions or concerns are discussed. Specific issues requiring follow up are specifically addressed.

## 2018-11-04 ENCOUNTER — Other Ambulatory Visit: Payer: Self-pay

## 2018-11-04 MED ORDER — METOPROLOL SUCCINATE ER 100 MG PO TB24: ORAL_TABLET | ORAL | 2 refills | Status: DC

## 2018-11-04 NOTE — Progress Notes (Signed)
Toprol XL refill sent to [facility]

## 2018-11-07 ENCOUNTER — Encounter: Payer: Self-pay | Admitting: Orthopaedic Surgery

## 2018-11-07 ENCOUNTER — Ambulatory Visit (INDEPENDENT_AMBULATORY_CARE_PROVIDER_SITE_OTHER): Payer: Medicare Other | Admitting: Orthopaedic Surgery

## 2018-11-07 DIAGNOSIS — M7061 Trochanteric bursitis, right hip: Secondary | ICD-10-CM | POA: Diagnosis not present

## 2018-11-07 MED ORDER — HYDROCODONE-ACETAMINOPHEN 7.5-325 MG PO TABS: 1.0000 | ORAL_TABLET | Freq: Four times a day (QID) | ORAL | 0 refills | Status: DC | PRN

## 2018-11-07 NOTE — Progress Notes (Signed)
PROCEDURE NOTE:  The patient request injection, verbal consent was obtained.  The right trochanteric area of the hip was prepped appropriately after time out was performed.   Sterile technique was observed and injection of 1 cc of Depo-Medrol 40 mg with several cc's of plain xylocaine. Anesthesia was provided by ethyl chloride and a 20-gauge needle was used to inject the hip area. The injection was tolerated well.  A band aid dressing was applied.  The patient was advised to apply ice later today and tomorrow to the injection sight as needed.  I have reviewed the [place] web site prior to prescribing narcotic medicine for this patient.   Electronically Signed Sanjuana Kava, MD 2/13/202010:56 AM

## 2018-11-08 ENCOUNTER — Ambulatory Visit: Payer: Medicare Other | Admitting: Cardiovascular Disease

## 2018-11-11 ENCOUNTER — Other Ambulatory Visit: Payer: Self-pay | Admitting: Cardiovascular Disease

## 2018-11-11 NOTE — Telephone Encounter (Signed)
Rx(s) sent to pharmacy electronically.  

## 2018-11-15 ENCOUNTER — Encounter: Payer: Medicare Other | Admitting: Cardiovascular Disease

## 2018-11-18 ENCOUNTER — Telehealth: Payer: Self-pay | Admitting: *Deleted

## 2018-11-18 DIAGNOSIS — H0589 Other disorders of orbit: Secondary | ICD-10-CM | POA: Diagnosis not present

## 2018-11-18 DIAGNOSIS — N3000 Acute cystitis without hematuria: Secondary | ICD-10-CM

## 2018-11-18 NOTE — Telephone Encounter (Signed)
Pt called in needing some diflucan called in. She said she knows she has an infection her urine has a bad smell to it. She said she didn't need an appt just needed something called in

## 2018-11-18 NOTE — Telephone Encounter (Signed)
Called patient and left message for them to return call at the office   

## 2018-11-19 NOTE — Telephone Encounter (Signed)
Spoke with pt and no complaints of itch or dysuria but concerned because her urine has had a bad smell for awhile now and said you normally give her diflucan for it. Wants it sent to [facility]

## 2018-11-19 NOTE — Telephone Encounter (Signed)
Bad odor more in keeping with infection, let her know. No itch unlikely yeast, I recommend she send urine to lab for UA and reflex culture  TODAY May use OTC prep like monistat if she thinkls it is yeast, the fluconazole interacts with her cholesterol  medication

## 2018-11-19 NOTE — Addendum Note (Signed)
Addended by: Eual Fines on: 11/19/2018 01:41 PM   Modules accepted: Orders

## 2018-11-19 NOTE — Telephone Encounter (Signed)
Left message for patient to submit urine to the lab. (told me to leave a msg bc she would be gone around lunchtime)

## 2018-11-20 ENCOUNTER — Other Ambulatory Visit (HOSPITAL_COMMUNITY): Payer: Self-pay

## 2018-11-20 DIAGNOSIS — I10 Essential (primary) hypertension: Secondary | ICD-10-CM | POA: Diagnosis not present

## 2018-11-20 DIAGNOSIS — E785 Hyperlipidemia, unspecified: Secondary | ICD-10-CM | POA: Diagnosis not present

## 2018-11-20 DIAGNOSIS — E790 Hyperuricemia without signs of inflammatory arthritis and tophaceous disease: Secondary | ICD-10-CM | POA: Diagnosis not present

## 2018-11-20 DIAGNOSIS — N3030 Trigonitis without hematuria: Secondary | ICD-10-CM | POA: Diagnosis not present

## 2018-11-20 DIAGNOSIS — R7301 Impaired fasting glucose: Secondary | ICD-10-CM | POA: Diagnosis not present

## 2018-11-20 DIAGNOSIS — D631 Anemia in chronic kidney disease: Secondary | ICD-10-CM

## 2018-11-20 DIAGNOSIS — N3000 Acute cystitis without hematuria: Secondary | ICD-10-CM | POA: Diagnosis not present

## 2018-11-20 DIAGNOSIS — E611 Iron deficiency: Secondary | ICD-10-CM

## 2018-11-20 DIAGNOSIS — D649 Anemia, unspecified: Secondary | ICD-10-CM

## 2018-11-20 DIAGNOSIS — N184 Chronic kidney disease, stage 4 (severe): Secondary | ICD-10-CM

## 2018-11-20 NOTE — Telephone Encounter (Signed)
Pt aware and will be leaving the specimen today

## 2018-11-21 ENCOUNTER — Other Ambulatory Visit: Payer: Self-pay

## 2018-11-21 ENCOUNTER — Inpatient Hospital Stay (HOSPITAL_COMMUNITY): Payer: Medicare Other

## 2018-11-21 ENCOUNTER — Inpatient Hospital Stay (HOSPITAL_COMMUNITY): Payer: Medicare Other | Attending: Hematology

## 2018-11-21 VITALS — BP 110/60 | HR 68 | Temp 98.4°F

## 2018-11-21 DIAGNOSIS — D649 Anemia, unspecified: Secondary | ICD-10-CM

## 2018-11-21 DIAGNOSIS — E611 Iron deficiency: Secondary | ICD-10-CM

## 2018-11-21 DIAGNOSIS — N184 Chronic kidney disease, stage 4 (severe): Secondary | ICD-10-CM | POA: Diagnosis not present

## 2018-11-21 DIAGNOSIS — D631 Anemia in chronic kidney disease: Secondary | ICD-10-CM | POA: Diagnosis not present

## 2018-11-21 LAB — LIPID PANEL
Cholesterol: 136 mg/dL (ref <200)
HDL: 44 mg/dL — ABNORMAL LOW (ref >=50)
LDL Cholesterol (Calc): 76 mg/dL (calc)
Non-HDL Cholesterol (Calc): 92 mg/dL (calc) (ref <130)
Total CHOL/HDL Ratio: 3.1 (calc) (ref <5.0)
Triglycerides: 81 mg/dL (ref <150)

## 2018-11-21 LAB — CBC
HCT: 34.3 % — ABNORMAL LOW (ref 36.0–46.0)
Hemoglobin: 10.3 g/dL — ABNORMAL LOW (ref 12.0–15.0)
MCH: 28.1 pg (ref 26.0–34.0)
MCHC: 30 g/dL (ref 30.0–36.0)
MCV: 93.7 fL (ref 80.0–100.0)
Platelets: 151 10*3/uL (ref 150–400)
RBC: 3.66 MIL/uL — ABNORMAL LOW (ref 3.87–5.11)
RDW: 16.1 % — ABNORMAL HIGH (ref 11.5–15.5)
WBC: 5.9 10*3/uL (ref 4.0–10.5)
nRBC: 0 % (ref 0.0–0.2)

## 2018-11-21 LAB — HEMOGLOBIN A1C
Hgb A1c MFr Bld: 5.6 % of total Hgb (ref <5.7)
Mean Plasma Glucose: 114 (calc)
eAG (mmol/L): 6.3 (calc)

## 2018-11-21 LAB — COMPLETE METABOLIC PANEL WITH GFR
AG Ratio: 1.4 (calc) (ref 1.0–2.5)
ALT: 11 U/L (ref 6–29)
AST: 23 U/L (ref 10–35)
Albumin: 4.2 g/dL (ref 3.6–5.1)
Alkaline phosphatase (APISO): 59 U/L (ref 37–153)
BUN/Creatinine Ratio: 38 (calc) — ABNORMAL HIGH (ref 6–22)
BUN: 63 mg/dL — ABNORMAL HIGH (ref 7–25)
CO2: 30 mmol/L (ref 20–32)
Calcium: 9.9 mg/dL (ref 8.6–10.4)
Chloride: 101 mmol/L (ref 98–110)
Creat: 1.66 mg/dL — ABNORMAL HIGH (ref 0.60–0.88)
GFR, Est African American: 32 mL/min/{1.73_m2} — ABNORMAL LOW (ref >=60)
GFR, Est Non African American: 28 mL/min/{1.73_m2} — ABNORMAL LOW (ref >=60)
Globulin: 3 g/dL (calc) (ref 1.9–3.7)
Glucose, Bld: 82 mg/dL (ref 65–99)
Potassium: 4 mmol/L (ref 3.5–5.3)
Sodium: 140 mmol/L (ref 135–146)
Total Bilirubin: 0.5 mg/dL (ref 0.2–1.2)
Total Protein: 7.2 g/dL (ref 6.1–8.1)

## 2018-11-21 LAB — URINE CULTURE
MICRO NUMBER:: 247130
SPECIMEN QUALITY:: ADEQUATE

## 2018-11-21 LAB — URIC ACID: Uric Acid, Serum: 6.8 mg/dL (ref 2.5–7.0)

## 2018-11-21 LAB — TSH: TSH: 0.72 mIU/L (ref 0.40–4.50)

## 2018-11-21 MED ORDER — EPOETIN ALFA 40000 UNIT/ML IJ SOLN
INTRAMUSCULAR | Status: AC
  Filled 2018-11-21: qty 1

## 2018-11-21 MED ORDER — EPOETIN ALFA 40000 UNIT/ML IJ SOLN
40000.0000 [IU] | Freq: Once | INTRAMUSCULAR | Status: AC
  Administered 2018-11-21: 40000 [IU] via SUBCUTANEOUS

## 2018-11-21 NOTE — Progress Notes (Signed)
[name] [name] presents today for injection per MD orders. Procrit 40,000 administered SQ in left Abdomen. Administration without incident. Patient tolerated well.  Vital signs stable. No complaints at this time. Discharged from clinic via wheel chair. F/U with [facility] as scheduled.

## 2018-11-21 NOTE — Patient Instructions (Signed)
[facility] at [facility]  Discharge Instructions:   _______________________________________________________________  Thank you for choosing [facility] at [facility] to provide your oncology and hematology care.  To afford each patient quality time with our providers, please arrive at least 15 minutes before your scheduled appointment.  You need to re-schedule your appointment if you arrive 10 or more minutes late.  We strive to give you quality time with our providers, and arriving late affects you and other patients whose appointments are after yours.  Also, if you no show three or more times for appointments you may be dismissed from the clinic.  Again, thank you for choosing [facility]. Our hope is that these requests will allow you access to exceptional care and in a timely manner. _______________________________________________________________  If you have questions after your visit, please contact our office at [phone] between the hours of 8:30 a.m. and 5:00 p.m. Voicemails left after 4:30 p.m. will not be returned until the following business day. _______________________________________________________________  For prescription refill requests, have your pharmacy contact our office. _______________________________________________________________  Recommendations made by the consultant and any test results will be sent to your referring physician. _______________________________________________________________ 

## 2018-11-25 ENCOUNTER — Ambulatory Visit (INDEPENDENT_AMBULATORY_CARE_PROVIDER_SITE_OTHER): Payer: Medicare Other | Admitting: *Deleted

## 2018-11-25 DIAGNOSIS — Z5181 Encounter for therapeutic drug level monitoring: Secondary | ICD-10-CM

## 2018-11-25 DIAGNOSIS — Z952 Presence of prosthetic heart valve: Secondary | ICD-10-CM

## 2018-11-25 LAB — POCT INR: INR: 2.1 (ref 2.0–3.0)

## 2018-11-25 NOTE — Patient Instructions (Signed)
Continue coumadin 2 tablets daily except 1 tablet on Tuesdays and Saturdays.  Continue greens  Recheck in 4 weeks.

## 2018-11-27 ENCOUNTER — Ambulatory Visit: Payer: Medicare Other | Admitting: Family Medicine

## 2018-12-02 ENCOUNTER — Ambulatory Visit (INDEPENDENT_AMBULATORY_CARE_PROVIDER_SITE_OTHER): Payer: Medicare Other

## 2018-12-02 DIAGNOSIS — Z95810 Presence of automatic (implantable) cardiac defibrillator: Secondary | ICD-10-CM

## 2018-12-02 DIAGNOSIS — I5032 Chronic diastolic (congestive) heart failure: Secondary | ICD-10-CM

## 2018-12-04 NOTE — Progress Notes (Signed)
EPIC Encounter for ICM Monitoring  Patient Name: [name] [name] is a 85 y.o. female Date: 12/04/2018 Primary Care Physican: Fayrene Helper, MD Primary Cardiologist:Croitoru Electrophysiologist:Taylor Bi-V Pacing:99.7% Last Weight:194.6 lbs Today's Weight:195.8 lbs   Clinical Status (28-Oct-2018 to 02-Dec-2018)  Treated VT/VF 0 episodes   AT/AF 1 episode   Time in AT/AF 24.0 hr/day (100.0%)   Longest AT/AF 43 days  Observations (2) (28-Oct-2018 to 02-Dec-2018)  AT/AF >= 6 hr for 35 days.  Patient Activity less than 1 hr/day for 5 weeks.   Heart failure questions reviewed.  Pt is asymptomatic.  Thoracic impedancetrending just below baseline normal.  Prescribed:Furosemide40 mg 1 tablet daily. Metolazaone 2.5 mg Take 1tablet twicea week (Wednesday and Saturdays). Potassium 20 mEq take 1 tablet daily.  Labs: 11/20/2018 Creatinine 1.66, BUN 63, Potassium 4.0, Sodium 140, GFR 28-32 09/26/2018 Creatinine 1.45, BUN 40, Potassium 4.1, Sodium 129, GFR 33-38 08/29/2018 Creatinine 1.85, BUN 49, Potassium 4.2, Sodium 134, GFR 25-28 08/15/2018 Creatinine 1.63, BUN 43, Potassium 4.1, Sodium 135, GFR 28-32 07/30/2018 Creatinine 1.58, BUN 49, Potassium 4.0, Sodium 135, GFR 29-34 07/09/2018 Creatinine 1.60, BUN 48, Potassium 1.9, Sodium 136, GFR 29-34 04/09/2018 Creatinine 1.91, BUN 53, Potassium 4.4, Sodium 135 03/30/2018 Creatinine 1.78, BUN 54, Potassium 3.9, Sodium 133, GFR 25-29 12/11/2017 Creatinine1.89, BUN44, Potassium4.2, Sodium139, [id number] A complete set of results can be found in Results Review.  Recommendations:She said she took Metolazone today and that should eliminate the extra fluid. No changes.  Encouraged to call for fluid symptoms.  Follow-up plan: ICM clinic phone appointment on4/13/2020. Office visit with Dr Sallyanne Kuster 02/05/2019  Copy of ICM check sent to Dr.Taylor and Dr Sallyanne Kuster.  3 month ICM trend: 12/02/2018    1 Year  ICM trend:       [name], RN 12/04/2018 10:04 AM

## 2018-12-05 NOTE — Progress Notes (Signed)
ty

## 2018-12-10 ENCOUNTER — Encounter: Payer: Self-pay | Admitting: Family Medicine

## 2018-12-10 ENCOUNTER — Ambulatory Visit (INDEPENDENT_AMBULATORY_CARE_PROVIDER_SITE_OTHER): Payer: Medicare Other | Admitting: Family Medicine

## 2018-12-10 ENCOUNTER — Other Ambulatory Visit: Payer: Self-pay

## 2018-12-10 VITALS — BP 110/60 | HR 73 | Resp 14 | Ht 67.0 in | Wt 200.8 lb

## 2018-12-10 DIAGNOSIS — E890 Postprocedural hypothyroidism: Secondary | ICD-10-CM

## 2018-12-10 DIAGNOSIS — I255 Ischemic cardiomyopathy: Secondary | ICD-10-CM

## 2018-12-10 DIAGNOSIS — E669 Obesity, unspecified: Secondary | ICD-10-CM | POA: Diagnosis not present

## 2018-12-10 DIAGNOSIS — E559 Vitamin D deficiency, unspecified: Secondary | ICD-10-CM

## 2018-12-10 DIAGNOSIS — I10 Essential (primary) hypertension: Secondary | ICD-10-CM

## 2018-12-10 DIAGNOSIS — E785 Hyperlipidemia, unspecified: Secondary | ICD-10-CM

## 2018-12-10 DIAGNOSIS — R7303 Prediabetes: Secondary | ICD-10-CM | POA: Diagnosis not present

## 2018-12-10 MED ORDER — MECLIZINE HCL 12.5 MG PO TABS: 12.5000 mg | ORAL_TABLET | Freq: Three times a day (TID) | ORAL | 2 refills | Status: DC | PRN

## 2018-12-10 NOTE — Patient Instructions (Addendum)
Wellness with Nurse in June CBC, Fasting lipid, cmp and eGFr, TSH and vit D 1 week before August visit  F/U with MD last week in August , call if you need me before  Meclizine  Is prescribed for as needed dizziness  Use tyle`nol one tablet 3 times daily for 3 days, then as needed , for right posterior chest pain  Use cane at  all times Thanks for choosing [facility], we consider it a privelige to serve you.   We will send letter to your medicare carrier  Or call totry toget lightweight portable oxygen for you ( rate1L/ min)

## 2018-12-10 NOTE — Progress Notes (Signed)
[name] [name]     MRN: [id number]      DOB: [birth year]   HPI [name] is here for follow up and re-evaluation of chronic medical conditions, medication management and review of any available recent lab and radiology data.  Preventive health is updated, specifically  Cancer screening and Immunization.   Questions or concerns regarding consultations or procedures which the PT has had in the interim are  addressed. The PT denies any adverse reactions to current medications since the last visit.  3 week h/o right flank pain rated at a 6 and right knee pin at an 8, sees orthopedics for managment  ROS Denies recent fever or chills. Denies sinus pressure, nasal congestion, ear pain or sore throat. Denies chest congestion, productive cough or wheezing. Denies chest pains, palpitations and leg swelling Denies abdominal pain, nausea, vomiting,diarrhea or constipation.   Chronic and increased  joint pain, swelling and limitation in mobility. Denies headaches, seizures, numbness, or tingling. Denies depression, anxiety or insomnia. Denies skin break down or rash.   PE  BP 110/60   Pulse 73   Resp 14   Ht 5\' 7"  (1.702 m)   Wt 200 lb 12.8 oz (91.1 kg)   SpO2 95% Comment: room air  BMI 31.45 kg/m   Patient alert and oriented and in no cardiopulmonary distress.  HEENT: No facial asymmetry, EOMI,   oropharynx pink and moist.  Neck supple no JVD, no mass.  Chest: Clear to auscultation bilaterally.  CVS: S1, S2 systolic  Murmur and click, no S3.Regular rate.  ABD: Soft non tender.   Ext: No edema  MS: Decreased  ROM spine, shoulders, hips and knees.  Skin: Intact, no ulcerations or rash noted.  Psych: Good eye contact, normal affect. Memory intact not anxious or depressed appearing.  CNS: CN 2-12 intact, power,  normal throughout.no focal deficits noted.   Assessment & Plan  Essential hypertension Controlled, no change in medication DASH diet and commitment to daily  physical activity for a minimum of 30 minutes discussed and encouraged, as a part of hypertension management. The importance of attaining a healthy weight is also discussed.  BP/Weight 12/10/2018 11/21/2018 10/24/2018 10/14/2018 09/30/2018 11/24/5174 10/01/735  Systolic BP 106 269 485 462 703 500 -  Diastolic BP 60 60 56 70 54 88 -  Wt. (Lbs) 200.8 - - 194.38 187 - 197  BMI 31.45 - - 30.44 29.29 - 30.85       Hyperlipidemia LDL goal <70 Hyperlipidemia:Low fat diet discussed and encouraged.   Lipid Panel  Lab Results  Component Value Date   CHOL 136 11/20/2018   HDL 44 (L) 11/20/2018   LDLCALC 76 11/20/2018   TRIG 81 11/20/2018   CHOLHDL 3.1 11/20/2018     Controlled, no change in medication   Mild obesity Deteriorated.  Patient re-educated about  the importance of commitment to a  minimum of 150 minutes of exercise per week as able.  The importance of healthy food choices with portion control discussed, as well as eating regularly and within a 12 hour window most days. The need to choose "clean , green" food 50 to 75% of the time is discussed, as well as to make water the primary drink and set a goal of 64 ounces water daily.  Encouraged to start a food diary,  and to consider  joining a support group. Sample diet sheets offered. Goals set by the patient for the next several months.   Weight /BMI 12/10/2018 10/14/2018 09/30/2018  WEIGHT 200 lb 12.8 oz 194 lb 6 oz 187 lb  HEIGHT 5\' 7"  5\' 7"  5\' 7"   BMI 31.45 kg/m2 30.44 kg/m2 29.29 kg/m2      Hypothyroidism, postsurgical Controlled, no change in medication   Prediabetes Patient educated about the importance of limiting  Carbohydrate intake , the need to commit to daily physical activity for a minimum of 30 minutes , and to commit weight loss. The fact that changes in all these areas will reduce or eliminate all together the development of diabetes is stressed.   Diabetic Labs Latest Ref Rng & Units 11/20/2018 10/24/2018  10/14/2018 09/30/2018 09/26/2018  HbA1c <5.7 % of total Hgb 5.6 - - - -  Microalbumin mg/dL - - - - -  Micro/Creat Ratio <30 mcg/mg creat - - - - -  Chol <200 mg/dL 136 - - - -  HDL > OR = 50 mg/dL 44(L) - - - -  Calc LDL mg/dL (calc) 76 - - - -  Triglycerides <150 mg/dL 81 - - - -  Creatinine 0.60 - 0.88 mg/dL 1.66(H) 1.98(H) 1.68(H) 1.35(H) 1.45(H)   BP/Weight 12/10/2018 11/21/2018 10/24/2018 10/14/2018 09/30/2018 11/02/32 05/26/7914  Systolic BP 056 979 480 165 537 482 -  Diastolic BP 60 60 56 70 54 88 -  Wt. (Lbs) 200.8 - - 194.38 187 - 197  BMI 31.45 - - 30.44 29.29 - 30.85   Foot/eye exam completion dates Latest Ref Rng & Units 07/17/2018 06/28/2017  Eye Exam No Retinopathy - -  Foot Form Completion - Done Done

## 2018-12-15 ENCOUNTER — Encounter: Payer: Self-pay | Admitting: Family Medicine

## 2018-12-15 NOTE — Assessment & Plan Note (Signed)
Deteriorated.  Patient re-educated about  the importance of commitment to a  minimum of 150 minutes of exercise per week as able.  The importance of healthy food choices with portion control discussed, as well as eating regularly and within a 12 hour window most days. The need to choose "clean , green" food 50 to 75% of the time is discussed, as well as to make water the primary drink and set a goal of 64 ounces water daily.  Encouraged to start a food diary,  and to consider  joining a support group. Sample diet sheets offered. Goals set by the patient for the next several months.   Weight /BMI 12/10/2018 10/14/2018 09/30/2018  WEIGHT 200 lb 12.8 oz 194 lb 6 oz 187 lb  HEIGHT 5\' 7"  5\' 7"  5\' 7"   BMI 31.45 kg/m2 30.44 kg/m2 29.29 kg/m2

## 2018-12-15 NOTE — Assessment & Plan Note (Signed)
Patient educated about the importance of limiting  Carbohydrate intake , the need to commit to daily physical activity for a minimum of 30 minutes , and to commit weight loss. The fact that changes in all these areas will reduce or eliminate all together the development of diabetes is stressed.   Diabetic Labs Latest Ref Rng & Units 11/20/2018 10/24/2018 10/14/2018 09/30/2018 09/26/2018  HbA1c <5.7 % of total Hgb 5.6 - - - -  Microalbumin mg/dL - - - - -  Micro/Creat Ratio <30 mcg/mg creat - - - - -  Chol <200 mg/dL 136 - - - -  HDL > OR = 50 mg/dL 44(L) - - - -  Calc LDL mg/dL (calc) 76 - - - -  Triglycerides <150 mg/dL 81 - - - -  Creatinine 0.60 - 0.88 mg/dL 1.66(H) 1.98(H) 1.68(H) 1.35(H) 1.45(H)   BP/Weight 12/10/2018 11/21/2018 10/24/2018 10/14/2018 09/30/2018 10/02/5629 01/01/7025  Systolic BP 378 588 502 774 128 786 -  Diastolic BP 60 60 56 70 54 88 -  Wt. (Lbs) 200.8 - - 194.38 187 - 197  BMI 31.45 - - 30.44 29.29 - 30.85   Foot/eye exam completion dates Latest Ref Rng & Units 07/17/2018 06/28/2017  Eye Exam No Retinopathy - -  Foot Form Completion - Done Done

## 2018-12-15 NOTE — Assessment & Plan Note (Signed)
Controlled, no change in medication DASH diet and commitment to daily physical activity for a minimum of 30 minutes discussed and encouraged, as a part of hypertension management. The importance of attaining a healthy weight is also discussed.  BP/Weight 12/10/2018 11/21/2018 10/24/2018 10/14/2018 09/30/2018 05/26/4781 05/30/6212  Systolic BP 086 578 469 629 528 413 -  Diastolic BP 60 60 56 70 54 88 -  Wt. (Lbs) 200.8 - - 194.38 187 - 197  BMI 31.45 - - 30.44 29.29 - 30.85

## 2018-12-15 NOTE — Assessment & Plan Note (Signed)
Hyperlipidemia:Low fat diet discussed and encouraged.   Lipid Panel  Lab Results  Component Value Date   CHOL 136 11/20/2018   HDL 44 (L) 11/20/2018   LDLCALC 76 11/20/2018   TRIG 81 11/20/2018   CHOLHDL 3.1 11/20/2018     Controlled, no change in medication

## 2018-12-15 NOTE — Assessment & Plan Note (Signed)
Controlled, no change in medication  

## 2018-12-16 ENCOUNTER — Other Ambulatory Visit (HOSPITAL_COMMUNITY): Payer: Medicare Other

## 2018-12-16 ENCOUNTER — Ambulatory Visit: Payer: Medicare Other | Admitting: Family

## 2018-12-17 ENCOUNTER — Telehealth: Payer: Self-pay | Admitting: *Deleted

## 2018-12-17 NOTE — Telephone Encounter (Signed)
Spoke with patient to advise that her ICD is at RRT as of 12/17/18. Explained that she still has an estimated 3 months of full function and that device replacement procedure may be delayed due to COVID-19. Advised that she can disregard alert tone if she hears it. Advised I will route this message to Sonia Baller, RN, for further scheduling assistance. Pt verbalizes understanding and denies questions or concerns at this time.

## 2018-12-19 ENCOUNTER — Inpatient Hospital Stay (HOSPITAL_COMMUNITY): Payer: Medicare Other

## 2018-12-19 ENCOUNTER — Other Ambulatory Visit: Payer: Self-pay

## 2018-12-19 ENCOUNTER — Inpatient Hospital Stay (HOSPITAL_COMMUNITY): Payer: Medicare Other | Attending: Hematology

## 2018-12-19 DIAGNOSIS — D631 Anemia in chronic kidney disease: Secondary | ICD-10-CM | POA: Diagnosis not present

## 2018-12-19 DIAGNOSIS — D649 Anemia, unspecified: Secondary | ICD-10-CM

## 2018-12-19 DIAGNOSIS — I130 Hypertensive heart and chronic kidney disease with heart failure and stage 1 through stage 4 chronic kidney disease, or unspecified chronic kidney disease: Secondary | ICD-10-CM | POA: Insufficient documentation

## 2018-12-19 DIAGNOSIS — E611 Iron deficiency: Secondary | ICD-10-CM

## 2018-12-19 DIAGNOSIS — N184 Chronic kidney disease, stage 4 (severe): Secondary | ICD-10-CM | POA: Diagnosis not present

## 2018-12-19 LAB — CBC
HCT: 35.5 % — ABNORMAL LOW (ref 36.0–46.0)
Hemoglobin: 11.1 g/dL — ABNORMAL LOW (ref 12.0–15.0)
MCH: 28.7 pg (ref 26.0–34.0)
MCHC: 31.3 g/dL (ref 30.0–36.0)
MCV: 91.7 fL (ref 80.0–100.0)
Platelets: 174 10*3/uL (ref 150–400)
RBC: 3.87 MIL/uL (ref 3.87–5.11)
RDW: 16.2 % — ABNORMAL HIGH (ref 11.5–15.5)
WBC: 5.6 10*3/uL (ref 4.0–10.5)
nRBC: 0 % (ref 0.0–0.2)

## 2018-12-19 NOTE — Progress Notes (Signed)
Hgb 11.1 today so Procrit injection held per parameters. Informed pt who verbalized understanding

## 2018-12-20 ENCOUNTER — Telehealth: Payer: Self-pay | Admitting: *Deleted

## 2018-12-20 ENCOUNTER — Telehealth: Payer: Self-pay

## 2018-12-20 ENCOUNTER — Other Ambulatory Visit: Payer: Self-pay | Admitting: Family Medicine

## 2018-12-20 ENCOUNTER — Telehealth: Payer: Self-pay | Admitting: Internal Medicine

## 2018-12-20 NOTE — Telephone Encounter (Signed)
°  _____________  ° °COVID-19 Pre-Screening Questions: °  °• Do you currently have a fever?  (yes = cancel and refer to pcp for e-visit)    NO °• Have you recently travelled on a cruise, internationally, or to [place], [place], [place], [place], [place], or [place], [place] ([place]) ?  (yes = cancel, stay home, monitor symptoms, and contact pcp or initiate e-visit if symptoms develop)   NO °• Have you been in contact with someone that is currently pending confirmation of Covid19 testing or has been confirmed to have the Covid19 virus?   (yes = cancel, stay home, away from tested individual, monitor symptoms, and contact pcp or initiate e-visit if symptoms develop)   NO °• Are you currently experiencing fatigue or cough? (yes = pt should be prepared to have a mask placed at the time of their visit).   NO ° ° °   ° ° ° ° °

## 2018-12-20 NOTE — Telephone Encounter (Signed)
See phone note from 12/17/18-- pt's ICD is at RRT. Spoke with patient, discussed daily alert tone for RRT. Advised no new alert transmissions received. Pt is scheduled for a virtual visit with Dr. Lovena Le on 12/24/18. Gave direct number to DC for any further questions or concerns. Pt verbalizes understanding and thanked me for my call.

## 2018-12-20 NOTE — Telephone Encounter (Signed)
°  1. Has your device fired? Yes, once this morning  2. Is you device beeping? no  3. Are you experiencing draining or swelling at device site? no  4. Are you calling to see if we received your device transmission? no  5. Have you passed out? no    Please route to [place]

## 2018-12-20 NOTE — Telephone Encounter (Signed)
Patient is scheduled for a virtual visit on 12/24/18 to discuss gen change.

## 2018-12-20 NOTE — Telephone Encounter (Signed)
Call placed to Pt.  Pt has no ability to do virtual visit.  Will plan for phone visit.  Pt indicates understanding.

## 2018-12-23 ENCOUNTER — Ambulatory Visit (INDEPENDENT_AMBULATORY_CARE_PROVIDER_SITE_OTHER): Payer: Medicare Other | Admitting: *Deleted

## 2018-12-23 ENCOUNTER — Other Ambulatory Visit: Payer: Self-pay

## 2018-12-23 DIAGNOSIS — Z5181 Encounter for therapeutic drug level monitoring: Secondary | ICD-10-CM

## 2018-12-23 DIAGNOSIS — Z952 Presence of prosthetic heart valve: Secondary | ICD-10-CM

## 2018-12-23 LAB — POCT INR: INR: 1.6 — AB (ref 2.0–3.0)

## 2018-12-23 NOTE — Patient Instructions (Signed)
Take coumadin 3 tablets tonight, 2 tablets tomorrow night then resume 2 tablets daily except 1 tablet on Tuesdays and Saturdays.  Continue greens  Recheck in 3 weeks.

## 2018-12-24 ENCOUNTER — Telehealth (INDEPENDENT_AMBULATORY_CARE_PROVIDER_SITE_OTHER): Payer: Medicare Other | Admitting: Internal Medicine

## 2018-12-24 ENCOUNTER — Other Ambulatory Visit: Payer: Self-pay

## 2018-12-24 DIAGNOSIS — I5022 Chronic systolic (congestive) heart failure: Secondary | ICD-10-CM

## 2018-12-24 DIAGNOSIS — I255 Ischemic cardiomyopathy: Secondary | ICD-10-CM

## 2018-12-24 DIAGNOSIS — I4891 Unspecified atrial fibrillation: Secondary | ICD-10-CM | POA: Diagnosis not present

## 2018-12-24 NOTE — Addendum Note (Signed)
Addended by: Willeen Cass A on: 12/24/2018 03:16 PM   Modules accepted: Orders

## 2018-12-24 NOTE — Progress Notes (Signed)
Electrophysiology TeleHealth Note   Due to national recommendations of social distancing due to [place] 19, an audio telehealth visit is felt to be most appropriate for this patient at this time.  See MyChart message from today for the patient's consent to telehealth for [facility].   Date:  12/24/2018   ID:  [name] [name], DOB [birth year], MRN [id number]  Location: patient's home  Provider location: [address], [place]  Evaluation Performed: Follow-up visit  PCP:  Fayrene Helper, MD  Cardiologist:  Sanda Klein, MD  Electrophysiologist:  Dr Lovena Le  Chief Complaint:  "I have been trying to stay away from people".  History of Present Illness:    [name] [name] is a 85 y.o. female who presents via audio conferencing for a telehealth visit today.  She has a h/o atrial fib and valvular heart disesase, s/p AVR and MVR. She had a h/o severe LV dysfunction and then underwent biv ICD upgrade about 5 years ago. She had an echo 14 months ago which showed normalization of her LV.  Since last being seen in our clinic, the patient reports doing very well.  Today, she denies symptoms of palpitations, chest pain, shortness of breath,  lower extremity edema, dizziness, presyncope, or syncope.  The patient is otherwise without complaint today.  The patient denies symptoms of fevers, chills, cough, or new SOB worrisome for COVID 19.  Past Medical History:  Diagnosis Date  . Adenomatous polyp of colon 12/16/2002   Dr. [name]/[facility]  . Allergy   . Cataract   . CHB (complete heart block) ([place]) 10/07/2014      . CHF (congestive heart failure) ([place])    a.  EF previously reduced to 25-30% b. EF improved to 60-65% by echo in 10/2017.   Marland Kitchen Chronic kidney disease, stage I 2006  . Constipation       . COPD (chronic obstructive pulmonary disease) ([place])       . DJD (degenerative joint disease) of lumbar spine   . DM (diabetes mellitus) ([place])  2006  . Esophageal reflux   . Glaucoma   . Gout       . H/O heart valve replacement with mechanical valve    a. s/p mechanical AVR in 2001 and mechanical MVR in 2004.  Marland Kitchen H/O wisdom tooth extraction   . Hyperlipemia   . IDA (iron deficiency anemia)    Parenteral iron/[name]  . Insomnia       . Non-ischemic cardiomyopathy ([place])    a. s/p MDT CRTD  . Obesity, unspecified   . Other and unspecified hyperlipidemia   . Oxygen deficiency 2014   nocturnal;  . Spondylolisthesis   . Spondylosis   . Thyroid cancer ([place])    remote thyroidectomy, no recurrence, pt denies in 2016 thyroid cancer  . Unspecified hypothyroidism       . 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 Vertigo         Past Surgical History:  Procedure Laterality Date  . AORTIC AND MITRAL VALVE REPLACEMENT  2001  . BI-VENTRICULAR IMPLANTABLE CARDIOVERTER DEFIBRILLATOR UPGRADE N/A 01/18/2015   Procedure: BI-VENTRICULAR IMPLANTABLE CARDIOVERTER DEFIBRILLATOR UPGRADE;  Surgeon: Evans Lance, MD;  Location: [facility] CATH LAB;  Service: Cardiovascular;  Laterality: N/A;  . CARDIAC CATHETERIZATION    . CARDIAC VALVE REPLACEMENT    . CARDIOVERSION N/A 11/13/2016   Procedure: CARDIOVERSION;  Surgeon: Sanda Klein, MD;  Location: [facility] ENDOSCOPY;  Service: Cardiovascular;  Laterality: N/A;  . COLONOSCOPY  06/12/2007   [place]ourk- Normal rectum/Normal colon  . COLONOSCOPY  12/16/02   small hemorrhoids  . COLONOSCOPY  06/20/2012   Procedure: COLONOSCOPY;  Surgeon: Daneil Dolin, MD;  Location: [facility] ENDO SUITE;  Service: Endoscopy;  Laterality: N/A;  10:45  . defibrillator implanted 2006    . DOPPLER ECHOCARDIOGRAPHY  2012  . DOPPLER ECHOCARDIOGRAPHY  05,06,07,08,09,2011  . ESOPHAGOGASTRODUODENOSCOPY  06/12/2007   Rourk- normal esophagus, small hiatal hernia, otherwise normal stomach, D1, D2  . EYE SURGERY Right 2005  . EYE SURGERY Left 2006  . ICD LEAD REMOVAL Left 02/08/2015   Procedure: ICD LEAD REMOVAL;   Surgeon: Evans Lance, MD;  Location: [facility] OR;  Service: Cardiovascular;  Laterality: Left;  "Will plan extraction and insertion of a BiV PM"  **Dr. Roxan Hockey backing up case**  . IMPLANTABLE CARDIOVERTER DEFIBRILLATOR (ICD) GENERATOR CHANGE Left 02/08/2015   Procedure: ICD GENERATOR CHANGE;  Surgeon: Evans Lance, MD;  Location: [facility];  Service: Cardiovascular;  Laterality: Left;  . INSERT / REPLACE / REMOVE PACEMAKER    . PACEMAKER INSERTION  May 2016  . pacemaker placed  2004  . right breast cyst removed     benign   . right cataract removed     2005  . TEE WITHOUT CARDIOVERSION N/A 11/13/2016   Procedure: TRANSESOPHAGEAL ECHOCARDIOGRAM (TEE);  Surgeon: Sanda Klein, MD;  Location: [facility] ENDOSCOPY;  Service: Cardiovascular;  Laterality: N/A;  . THYROIDECTOMY      Current Outpatient Medications  Medication Sig Dispense Refill  . acetaminophen (TYLENOL) 500 MG tablet Take 500 mg by mouth every 6 (six) hours as needed (pain).    Marland Kitchen albuterol (PROVENTIL HFA;VENTOLIN HFA) 108 (90 Base) MCG/ACT inhaler INHALE 2 PUFFS INTO THE LUNGS EVERY 6 HOURS AS NEEDED FOR WHEEZING OR SHORTNESS OF BREATH (Patient taking differently: Inhale 2 puffs into the lungs every 6 (six) hours as needed for wheezing or shortness of breath. ) 54 g 2  . albuterol (PROVENTIL) (2.5 MG/3ML) 0.083% nebulizer solution INHALE 1 VIAL VIA NEBULIZER THREE TIMES DAILY. (Patient taking differently: Take 2.5 mg by nebulization 2 (two) times daily. ) 90 vial 5  . allopurinol (ZYLOPRIM) 100 MG tablet Take 1 tablet (100 mg total) by mouth daily. (Patient taking differently: Take 100 mg by mouth every evening. ) 90 tablet 1  . calcium carbonate (OS-CAL) 1250 (500 Ca) MG chewable tablet Chew 1 tablet by mouth daily.    Marland Kitchen COUMADIN 5 MG tablet Take 2 tablets daily except 1 tablet on Tuesdays and Saturdays (Patient taking differently: Take 5-10 mg by mouth See admin instructions. Take 2 tablets daily except 1 tablet on Tuesdays and  Saturdays) 180 tablet 3  . docusate sodium (COLACE) 100 MG capsule Take 300 mg by mouth at bedtime.    . fluticasone (FLONASE) 50 MCG/ACT nasal spray SHAKE LIQUID AND USE 2 SPRAYS IN EACH NOSTRIL DAILY 48 g 1  . folic acid (FOLVITE) 1 MG tablet Take 1 tablet (1 mg total) by mouth daily. 100 tablet 5  . furosemide (LASIX) 40 MG tablet Take 1 tablet (40 mg total) by mouth daily. Skip dose today and start from 1/4 (Patient taking differently: Take 40 mg by mouth daily. ) 90 tablet 1  . gabapentin (NEURONTIN) 300 MG capsule TAKE 1 CAPSULE BY MOUTH AT BEDTIME (Patient taking differently: Take 300 mg by mouth at bedtime. ) 90 capsule 2  . hydrALAZINE (APRESOLINE) 25 MG tablet TAKE 1 TABLET  THREE TIMES A DAY (Patient taking differently: Take 25 mg by mouth 3 (three) times daily. ) 270 tablet 0  . HYDROcodone-acetaminophen (NORCO) 7.5-325 MG tablet Take 1 tablet by mouth every 6 (six) hours as needed for moderate pain (Must last 30 days.). 60 tablet 0  . isosorbide mononitrate (IMDUR) 60 MG 24 hr tablet Take 1 tablet (60 mg total) by mouth daily. 90 tablet 2  . meclizine (ANTIVERT) 12.5 MG tablet Take 1 tablet (12.5 mg total) by mouth 3 (three) times daily as needed for dizziness. 30 tablet 2  . metolazone (ZAROXOLYN) 2.5 MG tablet Take 1 tablet (2.5 mg total) by mouth 2 (two) times a week. Wednesdays and Saturdays  Skip dose on 1/4 and start from 1/8 (Patient taking differently: Take 2.5 mg by mouth 2 (two) times a week. Wednesdays and Saturdays) 30 tablet 3  . metoprolol succinate (TOPROL-XL) 100 MG 24 hr tablet TAKE 1 TABLET BY MOUTH DAILY. TAKE WITH OR IMMEDIATELY FOLLOWING A MEAL. 90 tablet 2  . Multiple Vitamin (MULTIVITAMIN) LIQD Take 5 mLs by mouth daily.    . ondansetron (ZOFRAN) 4 MG tablet Take 1 tablet (4 mg total) by mouth daily as needed for nausea or vomiting. 90 tablet 1  . potassium chloride SA (KLOR-CON M20) 20 MEQ tablet Take 1 tablet by mouth  daily 90 tablet 1  . pravastatin (PRAVACHOL)  40 MG tablet TAKE 1 TABLET BY MOUTH EVERY EVENING (Patient taking differently: Take 40 mg by mouth at bedtime. ) 90 tablet 1  . Propylene Glycol (SYSTANE BALANCE) 0.6 % SOLN Apply 1-2 drops to eye daily as needed (for dry eye relief).    . SYNTHROID 75 MCG tablet TAKE 1 TABLET DAILY (Patient taking differently: Take 75 mcg by mouth daily before breakfast. ) 90 tablet 1   No current facility-administered medications for this visit.    Facility-Administered Medications Ordered in Other Visits  Medication Dose Route Frequency Provider Last Rate Last Dose  . epoetin alfa (EPOGEN,PROCRIT) injection 40,000 Units  40,000 Units Subcutaneous Once Farrel Gobble, MD        Allergies:   Penicillins; Aspirin; Fentanyl; and Niacin   Social History:  The patient  reports that she quit smoking about 31 years ago. Her smoking use included cigarettes. She has a 30.00 pack-year smoking history. She has never used smokeless tobacco. She reports that she does not drink alcohol or use drugs.   Family History:  The patient's  family history includes Diabetes in her brother and brother; Heart attack in her sister; Heart disease in her brother, brother, father, and sister; Hypertension in her brother; Lung cancer in her brother; Pancreatic cancer in her mother.   ROS:  Please see the history of present illness.   All other systems are personally reviewed and negative.    Exam:    Vital Signs:  There were no vitals taken for this visit.  Well appearing, alert and conversant, regular work of breathing,  good skin color Eyes- anicteric, neuro- grossly intact, skin- no apparent rash or lesions or cyanosis, mouth- oral mucosa is pink   Labs/Other Tests and Data Reviewed:    Recent Labs: 09/26/2018: B Natriuretic Peptide 368.0 09/30/2018: Magnesium 2.0 11/20/2018: ALT 11; BUN 63; Creat 1.66; Potassium 4.0; Sodium 140; TSH 0.72 12/19/2018: Hemoglobin 11.1; Platelets 174   Wt Readings from Last 3 Encounters:   12/10/18 200 lb 12.8 oz (91.1 kg)  10/14/18 194 lb 6 oz (88.2 kg)  09/30/18 187 lb (84.8 kg)  Other studies personally reviewed:  Last device remote is reviewed from [facility] PDF dated 1/20 which reveals normal device function, no arrhythmias and RRT.   ASSESSMENT & PLAN:    1.  Chronic systolic heart failure - she has had normalization of her EF with biv pacing based on her echo from 14 months ago. I will ask her to obtain another echo in a month and then get her in for gen change out. 2. ICD - she is at RRT and if her EF is still good, she will be down graded to a biv ppm based on her EF and lack of ventricular arrhythmias and advanced age and comorbidities. 3. Atrial fib - she is well rate controlled with underlying CHB. 4. COVID 19 screen The patient denies symptoms of COVID 19 at this time.  The importance of social distancing was discussed today.  Follow-up:  n/a Next remote: n/a  Current medicines are reviewed at length with the patient today.   The patient does not have concerns regarding her medicines.  The following changes were made today:  none  Labs/ tests ordered today include: 2D echo in 3-4 weeks in [place] No orders of the defined types were placed in this encounter.    Patient Risk:  after full review of this patients clinical status, I feel that they are at moderate risk at this time.  Today, I have spent 20 minutes with the patient with telehealth technology discussing the above problems. Loma Messing, MD  12/24/2018 11:32 AM     [address] [place] [address] [phone] (office) [phone] (fax)

## 2018-12-26 ENCOUNTER — Other Ambulatory Visit: Payer: Self-pay

## 2018-12-26 ENCOUNTER — Ambulatory Visit (INDEPENDENT_AMBULATORY_CARE_PROVIDER_SITE_OTHER): Payer: Medicare Other | Admitting: *Deleted

## 2018-12-26 DIAGNOSIS — I255 Ischemic cardiomyopathy: Secondary | ICD-10-CM

## 2018-12-26 LAB — CUP PACEART REMOTE DEVICE CHECK
Battery Remaining Longevity: 1 mo — CL
Battery Voltage: 2.69 V
Brady Statistic AP VP Percent: 0.01 %
Brady Statistic AP VS Percent: 0 %
Brady Statistic AS VP Percent: 99.21 %
Brady Statistic AS VS Percent: 0.77 %
Brady Statistic RA Percent Paced: 0.01 %
Brady Statistic RV Percent Paced: 99.28 %
Date Time Interrogation Session: 20200402083823
HighPow Impedance: 37 Ohm
HighPow Impedance: 44 Ohm
Implantable Lead Implant Date: 20040521
Implantable Lead Implant Date: 20071218
Implantable Lead Implant Date: 20160516
Implantable Lead Location: 753858
Implantable Lead Location: 753859
Implantable Lead Location: 753860
Implantable Lead Model: 5076
Implantable Lead Model: 6947
Implantable Pulse Generator Implant Date: 20160516
Lead Channel Impedance Value: 285 Ohm
Lead Channel Impedance Value: 342 Ohm
Lead Channel Impedance Value: 342 Ohm
Lead Channel Impedance Value: 361 Ohm
Lead Channel Impedance Value: 361 Ohm
Lead Channel Impedance Value: 399 Ohm
Lead Channel Impedance Value: 456 Ohm
Lead Channel Impedance Value: 551 Ohm
Lead Channel Impedance Value: 589 Ohm
Lead Channel Impedance Value: 646 Ohm
Lead Channel Impedance Value: 665 Ohm
Lead Channel Impedance Value: 722 Ohm
Lead Channel Impedance Value: 760 Ohm
Lead Channel Pacing Threshold Amplitude: 2.25 V
Lead Channel Pacing Threshold Amplitude: 5 V
Lead Channel Pacing Threshold Pulse Width: 0.4 ms
Lead Channel Pacing Threshold Pulse Width: 1.5 ms
Lead Channel Sensing Intrinsic Amplitude: 10.5 mV
Lead Channel Sensing Intrinsic Amplitude: 2 mV
Lead Channel Setting Pacing Amplitude: 2 V
Lead Channel Setting Pacing Amplitude: 2.5 V
Lead Channel Setting Pacing Amplitude: 3 V
Lead Channel Setting Pacing Pulse Width: 0.6 ms
Lead Channel Setting Pacing Pulse Width: 1 ms
Lead Channel Setting Sensing Sensitivity: 0.3 mV

## 2018-12-31 ENCOUNTER — Telehealth: Payer: Self-pay | Admitting: Internal Medicine

## 2018-12-31 NOTE — Telephone Encounter (Signed)
New Message  Patient is calling in reference to her defibrillator. She said its been beeping and Dr. Lovena Le was suppose to be working on a date for her battery change out. But she has not heard anything. Please call.

## 2018-12-31 NOTE — Telephone Encounter (Signed)
Returned call to Pt.  Advised this nurse would touch base with her first week of May to schedule ECHO and gen change.  Advised Pt to call this nurse if she does not hear from me before.  Pt indicates understanding.  Pt reassure

## 2019-01-01 ENCOUNTER — Telehealth: Payer: Self-pay | Admitting: Orthopaedic Surgery

## 2019-01-01 MED ORDER — HYDROCODONE-ACETAMINOPHEN 7.5-325 MG PO TABS: 1.0000 | ORAL_TABLET | Freq: Four times a day (QID) | ORAL | 0 refills | Status: DC | PRN

## 2019-01-01 NOTE — Telephone Encounter (Signed)
Patient called for refill:  HYDROcodone-acetaminophen (NORCO) 7.5-325 MG tablet 60 tablet 0 11/07/2018    Sig - Route: Take 1 tablet by mouth every 6 (six) hours as needed for moderate pain (Must last 30 days     - [employer], [address], Linna Hoff    - Due to no appointment scheduled as of yet - okay for virtual visit in May?  Patient said not ready to come out for some time to appointments due to covid-19 restrictions

## 2019-01-03 ENCOUNTER — Encounter: Payer: Self-pay | Admitting: Cardiology

## 2019-01-03 NOTE — Progress Notes (Signed)
Remote ICD transmission.   

## 2019-01-06 ENCOUNTER — Ambulatory Visit (INDEPENDENT_AMBULATORY_CARE_PROVIDER_SITE_OTHER): Payer: Medicare Other

## 2019-01-06 ENCOUNTER — Other Ambulatory Visit: Payer: Self-pay

## 2019-01-06 DIAGNOSIS — Z95810 Presence of automatic (implantable) cardiac defibrillator: Secondary | ICD-10-CM

## 2019-01-06 DIAGNOSIS — I5032 Chronic diastolic (congestive) heart failure: Secondary | ICD-10-CM

## 2019-01-07 NOTE — Progress Notes (Signed)
EPIC Encounter for ICM Monitoring  Patient Name: [name] [name] is a 85 y.o. female Date: 01/07/2019 Primary Care Physican: Fayrene Helper, MD Primary Cardiologist:[place] Electrophysiologist:Taylor Bi-V Pacing:99.4% Last Weight:195.8 lbs 01/07/2019 Weight: unknown  Clinical Status (26-Dec-2018 to 06-Jan-2019)  Treated VT/VF 0 episodes   AT/AF 1 episode   Time in AT/AF 24.0 hr/day (100.0%)   Longest AT/AF 59 days Observations (2) (26-Dec-2018 to 06-Jan-2019)  AT/AF >= 6 hr for 11 days.  Patient Activity less than 1 hr/day for 1 weeks.       Attempted call to patient and unable to reach.  Transmission reviewed.   Thoracic impedance normal.  Prescribed:Furosemide40 mg 1 tablet daily. Metolazaone 2.5 mg Take 1tablet twicea week (Wednesday and Saturdays). Potassium 20 mEq take 1 tablet daily.  Labs: 11/20/2018 Creatinine 1.66, BUN 63, Potassium 4.0, Sodium 140, GFR 28-32 09/26/2018 Creatinine 1.45, BUN 40, Potassium 4.1, Sodium 129, GFR 33-38 08/29/2018 Creatinine 1.85, BUN 49, Potassium 4.2, Sodium 134, GFR 25-28 08/15/2018 Creatinine 1.63, BUN 43, Potassium 4.1, Sodium 135, GFR 28-32 07/30/2018 Creatinine 1.58, BUN 49, Potassium 4.0, Sodium 135, GFR 29-34 07/09/2018 Creatinine 1.60, BUN 48, Potassium 1.9, Sodium 136, GFR 29-34 04/09/2018 Creatinine 1.91, BUN 53, Potassium 4.4, Sodium 135 03/30/2018 Creatinine 1.78, BUN 54, Potassium 3.9, Sodium 133, GFR 25-29 12/11/2017 Creatinine1.89, BUN44, Potassium4.2, Sodium139, YIR48-54 A complete set of results can be found in Results Review.  Recommendations:Unable to reach.  Follow-up plan: ICM clinic phone appointment on5/18/2020. Office visit with Dr Sallyanne Kuster 02/05/2019  Copy of ICM check sent to Dr.Taylor.  3 month ICM trend: 01/06/2019    1 Year ICM trend:       [name], RN 01/07/2019 9:03 AM

## 2019-01-08 ENCOUNTER — Telehealth: Payer: Self-pay

## 2019-01-08 NOTE — Telephone Encounter (Signed)
Remote ICM transmission received.  Attempted call to patient regarding ICM remote transmission and no answer.  

## 2019-01-13 ENCOUNTER — Other Ambulatory Visit: Payer: Self-pay

## 2019-01-13 ENCOUNTER — Ambulatory Visit (INDEPENDENT_AMBULATORY_CARE_PROVIDER_SITE_OTHER): Payer: Medicare Other | Admitting: *Deleted

## 2019-01-13 DIAGNOSIS — Z5181 Encounter for therapeutic drug level monitoring: Secondary | ICD-10-CM

## 2019-01-13 DIAGNOSIS — Z952 Presence of prosthetic heart valve: Secondary | ICD-10-CM | POA: Diagnosis not present

## 2019-01-13 LAB — POCT INR: INR: 1.9 — AB (ref 2.0–3.0)

## 2019-01-13 NOTE — Patient Instructions (Signed)
Increase coumadin to 2 tablets daily except 1 tablet on Saturdays.  Continue greens  Recheck in 4 weeks

## 2019-01-14 NOTE — Progress Notes (Addendum)
Returned patient call as requested by voice mail message.  She stated she is doing well and denies any fluid symptoms.  Reviewed transmission and next remote scheduled for 02/05/2019. Encouraged to call for any fluid symptoms.

## 2019-01-16 ENCOUNTER — Encounter (HOSPITAL_COMMUNITY): Payer: Self-pay

## 2019-01-16 ENCOUNTER — Other Ambulatory Visit: Payer: Self-pay

## 2019-01-16 ENCOUNTER — Inpatient Hospital Stay (HOSPITAL_COMMUNITY): Payer: Medicare Other

## 2019-01-16 ENCOUNTER — Inpatient Hospital Stay (HOSPITAL_COMMUNITY): Payer: Medicare Other | Attending: Hematology

## 2019-01-16 VITALS — BP 111/51 | HR 70 | Temp 98.4°F | Resp 20

## 2019-01-16 DIAGNOSIS — E611 Iron deficiency: Secondary | ICD-10-CM

## 2019-01-16 DIAGNOSIS — D631 Anemia in chronic kidney disease: Secondary | ICD-10-CM

## 2019-01-16 DIAGNOSIS — N184 Chronic kidney disease, stage 4 (severe): Secondary | ICD-10-CM | POA: Diagnosis not present

## 2019-01-16 DIAGNOSIS — D649 Anemia, unspecified: Secondary | ICD-10-CM

## 2019-01-16 LAB — CBC
HCT: 30.9 % — ABNORMAL LOW (ref 36.0–46.0)
Hemoglobin: 9.6 g/dL — ABNORMAL LOW (ref 12.0–15.0)
MCH: 29.1 pg (ref 26.0–34.0)
MCHC: 31.1 g/dL (ref 30.0–36.0)
MCV: 93.6 fL (ref 80.0–100.0)
Platelets: 168 10*3/uL (ref 150–400)
RBC: 3.3 MIL/uL — ABNORMAL LOW (ref 3.87–5.11)
RDW: 16.7 % — ABNORMAL HIGH (ref 11.5–15.5)
WBC: 6.6 10*3/uL (ref 4.0–10.5)
nRBC: 0 % (ref 0.0–0.2)

## 2019-01-16 MED ORDER — EPOETIN ALFA 40000 UNIT/ML IJ SOLN
40000.0000 [IU] | Freq: Once | INTRAMUSCULAR | Status: AC
  Administered 2019-01-16: 11:00:00 40000 [IU] via SUBCUTANEOUS

## 2019-01-16 MED ORDER — EPOETIN ALFA 40000 UNIT/ML IJ SOLN
INTRAMUSCULAR | Status: AC
  Filled 2019-01-16: qty 1

## 2019-01-24 ENCOUNTER — Telehealth: Payer: Self-pay

## 2019-01-24 ENCOUNTER — Other Ambulatory Visit: Payer: Self-pay | Admitting: Family Medicine

## 2019-01-24 DIAGNOSIS — N184 Chronic kidney disease, stage 4 (severe): Secondary | ICD-10-CM

## 2019-01-24 DIAGNOSIS — I255 Ischemic cardiomyopathy: Secondary | ICD-10-CM

## 2019-01-24 DIAGNOSIS — I442 Atrioventricular block, complete: Secondary | ICD-10-CM

## 2019-01-24 DIAGNOSIS — Z95810 Presence of automatic (implantable) cardiac defibrillator: Secondary | ICD-10-CM

## 2019-01-24 NOTE — Telephone Encounter (Signed)
Call placed to Pt.   Scheduled for gen change on May 7 at 9:30 am with Dr. Lovena Le.  Pt will come to [address] office on May 5 for lab work and to pick up Materials engineer.

## 2019-01-28 ENCOUNTER — Other Ambulatory Visit: Payer: Medicare Other

## 2019-01-28 ENCOUNTER — Other Ambulatory Visit: Payer: Self-pay

## 2019-01-28 DIAGNOSIS — I442 Atrioventricular block, complete: Secondary | ICD-10-CM | POA: Diagnosis not present

## 2019-01-28 DIAGNOSIS — Z95810 Presence of automatic (implantable) cardiac defibrillator: Secondary | ICD-10-CM | POA: Diagnosis not present

## 2019-01-28 DIAGNOSIS — I255 Ischemic cardiomyopathy: Secondary | ICD-10-CM

## 2019-01-28 LAB — CBC WITH DIFFERENTIAL/PLATELET
Basophils Absolute: 0 10*3/uL (ref 0.0–0.2)
Basos: 0 %
EOS (ABSOLUTE): 0 10*3/uL (ref 0.0–0.4)
Eos: 0 %
Hematocrit: 29.9 % — ABNORMAL LOW (ref 34.0–46.6)
Hemoglobin: 9.6 g/dL — ABNORMAL LOW (ref 11.1–15.9)
Lymphocytes Absolute: 0.7 10*3/uL (ref 0.7–3.1)
Lymphs: 14 %
MCH: 29 pg (ref 26.6–33.0)
MCHC: 32.1 g/dL (ref 31.5–35.7)
MCV: 90 fL (ref 79–97)
Monocytes Absolute: 0.6 10*3/uL (ref 0.1–0.9)
Monocytes: 13 %
Neutrophils Absolute: 3.6 10*3/uL (ref 1.4–7.0)
Neutrophils: 73 %
Platelets: 199 10*3/uL (ref 150–450)
RBC: 3.31 x10E6/uL — ABNORMAL LOW (ref 3.77–5.28)
RDW: 17.1 % — ABNORMAL HIGH (ref 11.7–15.4)
WBC: 4.9 10*3/uL (ref 3.4–10.8)

## 2019-01-28 LAB — BASIC METABOLIC PANEL
BUN/Creatinine Ratio: 28 (ref 12–28)
BUN: 52 mg/dL — ABNORMAL HIGH (ref 8–27)
CO2: 31 mmol/L — ABNORMAL HIGH (ref 20–29)
Calcium: 9.5 mg/dL (ref 8.7–10.3)
Chloride: 92 mmol/L — ABNORMAL LOW (ref 96–106)
Creatinine, Ser: 1.87 mg/dL — ABNORMAL HIGH (ref 0.57–1.00)
GFR calc Af Amer: 28 mL/min/{1.73_m2} — ABNORMAL LOW (ref >59)
GFR calc non Af Amer: 24 mL/min/{1.73_m2} — ABNORMAL LOW (ref >59)
Glucose: 117 mg/dL — ABNORMAL HIGH (ref 65–99)
Potassium: 4.6 mmol/L (ref 3.5–5.2)
Sodium: 132 mmol/L — ABNORMAL LOW (ref 134–144)

## 2019-01-28 LAB — PROTIME-INR
INR: 2.2 — ABNORMAL HIGH (ref 0.8–1.2)
Prothrombin Time: 22.1 s — ABNORMAL HIGH (ref 9.1–12.0)

## 2019-01-29 ENCOUNTER — Telehealth: Payer: Self-pay

## 2019-01-29 NOTE — Telephone Encounter (Signed)
Call received from Pt.  Pt accidentally took her warfarin last night.  Advised per Dr. Lovena Le to eat some greens today, do NOT take her warfarin tonight.  Also arrival time has changed to 9:30 am tomorrow 01/30/2019.  Pt indicates understanding of all directions.

## 2019-01-30 ENCOUNTER — Other Ambulatory Visit: Payer: Self-pay

## 2019-01-30 ENCOUNTER — Encounter (HOSPITAL_COMMUNITY)
Admission: RE | Disposition: A | Discharge status: Home / Self Care | Payer: Medicare Other | Source: Home / Self Care | Attending: Internal Medicine

## 2019-01-30 ENCOUNTER — Ambulatory Visit (HOSPITAL_COMMUNITY)
Admission: RE | Admit: 2019-01-30 | Discharge: 2019-01-30 | Disposition: A | Discharge status: Home / Self Care | Payer: Medicare Other | Attending: Internal Medicine | Admitting: Internal Medicine

## 2019-01-30 DIAGNOSIS — E669 Obesity, unspecified: Secondary | ICD-10-CM | POA: Diagnosis not present

## 2019-01-30 DIAGNOSIS — E039 Hypothyroidism, unspecified: Secondary | ICD-10-CM | POA: Diagnosis not present

## 2019-01-30 DIAGNOSIS — Z880 Allergy status to penicillin: Secondary | ICD-10-CM | POA: Diagnosis not present

## 2019-01-30 DIAGNOSIS — M1990 Unspecified osteoarthritis, unspecified site: Secondary | ICD-10-CM | POA: Diagnosis not present

## 2019-01-30 DIAGNOSIS — Z79899 Other long term (current) drug therapy: Secondary | ICD-10-CM | POA: Diagnosis not present

## 2019-01-30 DIAGNOSIS — M109 Gout, unspecified: Secondary | ICD-10-CM | POA: Insufficient documentation

## 2019-01-30 DIAGNOSIS — Z95810 Presence of automatic (implantable) cardiac defibrillator: Secondary | ICD-10-CM | POA: Diagnosis present

## 2019-01-30 DIAGNOSIS — Z006 Encounter for examination for normal comparison and control in clinical research program: Secondary | ICD-10-CM | POA: Diagnosis not present

## 2019-01-30 DIAGNOSIS — Z885 Allergy status to narcotic agent status: Secondary | ICD-10-CM | POA: Insufficient documentation

## 2019-01-30 DIAGNOSIS — N181 Chronic kidney disease, stage 1: Secondary | ICD-10-CM | POA: Insufficient documentation

## 2019-01-30 DIAGNOSIS — I4891 Unspecified atrial fibrillation: Secondary | ICD-10-CM | POA: Diagnosis not present

## 2019-01-30 DIAGNOSIS — H409 Unspecified glaucoma: Secondary | ICD-10-CM | POA: Diagnosis not present

## 2019-01-30 DIAGNOSIS — E785 Hyperlipidemia, unspecified: Secondary | ICD-10-CM | POA: Diagnosis not present

## 2019-01-30 DIAGNOSIS — E1122 Type 2 diabetes mellitus with diabetic chronic kidney disease: Secondary | ICD-10-CM | POA: Diagnosis not present

## 2019-01-30 DIAGNOSIS — I428 Other cardiomyopathies: Secondary | ICD-10-CM | POA: Diagnosis not present

## 2019-01-30 DIAGNOSIS — K219 Gastro-esophageal reflux disease without esophagitis: Secondary | ICD-10-CM | POA: Insufficient documentation

## 2019-01-30 DIAGNOSIS — Z7901 Long term (current) use of anticoagulants: Secondary | ICD-10-CM | POA: Diagnosis not present

## 2019-01-30 DIAGNOSIS — Z4502 Encounter for adjustment and management of automatic implantable cardiac defibrillator: Secondary | ICD-10-CM

## 2019-01-30 DIAGNOSIS — J449 Chronic obstructive pulmonary disease, unspecified: Secondary | ICD-10-CM | POA: Insufficient documentation

## 2019-01-30 DIAGNOSIS — Z45010 Encounter for checking and testing of cardiac pacemaker pulse generator [battery]: Secondary | ICD-10-CM | POA: Insufficient documentation

## 2019-01-30 DIAGNOSIS — Z87891 Personal history of nicotine dependence: Secondary | ICD-10-CM | POA: Insufficient documentation

## 2019-01-30 DIAGNOSIS — Z952 Presence of prosthetic heart valve: Secondary | ICD-10-CM | POA: Insufficient documentation

## 2019-01-30 DIAGNOSIS — I442 Atrioventricular block, complete: Secondary | ICD-10-CM | POA: Insufficient documentation

## 2019-01-30 DIAGNOSIS — Z79890 Hormone replacement therapy (postmenopausal): Secondary | ICD-10-CM | POA: Diagnosis not present

## 2019-01-30 DIAGNOSIS — Z8249 Family history of ischemic heart disease and other diseases of the circulatory system: Secondary | ICD-10-CM | POA: Insufficient documentation

## 2019-01-30 DIAGNOSIS — G4700 Insomnia, unspecified: Secondary | ICD-10-CM | POA: Diagnosis not present

## 2019-01-30 DIAGNOSIS — Z886 Allergy status to analgesic agent status: Secondary | ICD-10-CM | POA: Insufficient documentation

## 2019-01-30 DIAGNOSIS — Z833 Family history of diabetes mellitus: Secondary | ICD-10-CM | POA: Insufficient documentation

## 2019-01-30 DIAGNOSIS — Z6831 Body mass index (BMI) 31.0-31.9, adult: Secondary | ICD-10-CM | POA: Insufficient documentation

## 2019-01-30 DIAGNOSIS — I5022 Chronic systolic (congestive) heart failure: Secondary | ICD-10-CM | POA: Insufficient documentation

## 2019-01-30 HISTORY — PX: BIV ICD GENERATOR CHANGEOUT: EP1194

## 2019-01-30 LAB — GLUCOSE, CAPILLARY: Glucose-Capillary: 90 mg/dL (ref 70–99)

## 2019-01-30 LAB — PROTIME-INR
INR: 2 — ABNORMAL HIGH (ref 0.8–1.2)
Prothrombin Time: 22.3 seconds — ABNORMAL HIGH (ref 11.4–15.2)

## 2019-01-30 LAB — SURGICAL PCR SCREEN
MRSA, PCR: NEGATIVE
Staphylococcus aureus: NEGATIVE

## 2019-01-30 SURGERY — BIV ICD GENERATOR CHANGEOUT
Anesthesia: LOCAL

## 2019-01-30 MED ORDER — VANCOMYCIN HCL IN DEXTROSE 1-5 GM/200ML-% IV SOLN
INTRAVENOUS | Status: AC
  Filled 2019-01-30: qty 200

## 2019-01-30 MED ORDER — VANCOMYCIN HCL IN DEXTROSE 1-5 GM/200ML-% IV SOLN
1000.0000 mg | INTRAVENOUS | Status: AC
  Administered 2019-01-30: 1000 mg via INTRAVENOUS

## 2019-01-30 MED ORDER — MUPIROCIN 2 % EX OINT
TOPICAL_OINTMENT | CUTANEOUS | Status: AC
  Administered 2019-01-30: 10:00:00
  Filled 2019-01-30: qty 22

## 2019-01-30 MED ORDER — LIDOCAINE HCL (PF) 1 % IJ SOLN
INTRAMUSCULAR | Status: DC | PRN
  Administered 2019-01-30: 60 mL

## 2019-01-30 MED ORDER — SODIUM CHLORIDE 0.9 % IV SOLN
INTRAVENOUS | Status: AC
  Filled 2019-01-30: qty 2

## 2019-01-30 MED ORDER — LIDOCAINE HCL (PF) 1 % IJ SOLN
INTRAMUSCULAR | Status: AC
  Filled 2019-01-30: qty 30

## 2019-01-30 MED ORDER — ACETAMINOPHEN 325 MG PO TABS: 325.0000 mg | ORAL_TABLET | ORAL | Status: DC | PRN

## 2019-01-30 MED ORDER — SODIUM CHLORIDE 0.9 % IV SOLN
80.0000 mg | INTRAVENOUS | Status: AC
  Administered 2019-01-30: 80 mg

## 2019-01-30 MED ORDER — SODIUM CHLORIDE 0.9 % IV SOLN
INTRAVENOUS | Status: DC
  Administered 2019-01-30: 10:00:00 via INTRAVENOUS

## 2019-01-30 MED ORDER — MIDAZOLAM HCL 5 MG/5ML IJ SOLN
INTRAMUSCULAR | Status: AC
  Filled 2019-01-30: qty 5

## 2019-01-30 MED ORDER — CEFAZOLIN SODIUM-DEXTROSE 2-4 GM/100ML-% IV SOLN
INTRAVENOUS | Status: AC
  Filled 2019-01-30: qty 100

## 2019-01-30 MED ORDER — ONDANSETRON HCL 4 MG/2ML IJ SOLN: 4.0000 mg | Freq: Four times a day (QID) | INTRAMUSCULAR | Status: DC | PRN

## 2019-01-30 MED ORDER — MIDAZOLAM HCL 5 MG/5ML IJ SOLN
INTRAMUSCULAR | Status: DC | PRN
  Administered 2019-01-30: 1 mg via INTRAVENOUS

## 2019-01-30 MED ORDER — CHLORHEXIDINE GLUCONATE 4 % EX LIQD: 60.0000 mL | Freq: Once | CUTANEOUS | Status: DC

## 2019-01-30 SURGICAL SUPPLY — 4 items
CABLE SURGICAL S-101-97-12 (CABLE) ×2 IMPLANT
DEVICE CRTP PERCEPTA QUAD MRI (Pacemaker) ×1 IMPLANT
PAD PRO RADIOLUCENT 2001M-C (PAD) ×2 IMPLANT
TRAY PACEMAKER INSERTION (PACKS) ×2 IMPLANT

## 2019-01-30 NOTE — H&P (Signed)
HPI [name] [name] returns today for followup. She is an 85yo woman with chronic systolic heart failure, s/p aortic and mitral valve replacement, s/p ICD Implant who developed worsening CHF, was found to have an occluded subclavian vein, and underwent RV extraction and insertion of a new ICD lead and LV lead. Her old ICD lead had broken.  She has not had any hospitalization for CHF since her last visit. No chest pain. She is tolerating her Warfarin. She has an ASA allergy.       Allergies  Allergen Reactions  . Penicillins Other (See Comments)    Bruise Has patient had a PCN reaction causing immediate rash, facial/tongue/throat swelling, SOB or lightheadedness with hypotension: no Has patient had a PCN reaction causing severe rash involving mucus membranes or skin necrosis: no Has patient had a PCN reaction that required hospitalization: no Has patient had a PCN reaction occurring within the last 10 years: no If all of the above answers are "NO", then may proceed with Cephalosporin use.   . Aspirin Other (See Comments)    On coumadin  . Fentanyl Dermatitis    Rash with patch   . Niacin Itching           Current Outpatient Medications  Medication Sig Dispense Refill  . acetaminophen (TYLENOL) 500 MG tablet Take 500 mg by mouth every 6 (six) hours as needed (pain).    Marland Kitchen albuterol (PROVENTIL) (2.5 MG/3ML) 0.083% nebulizer solution INHALE 1 VIAL VIA NEBULIZER THREE TIMES DAILY. (Patient taking differently: INHALE 1 VIAL VIA NEBULIZER THREE TIMES DAILY AS NEEDED) 90 vial 5  . albuterol (VENTOLIN HFA) 108 (90 Base) MCG/ACT inhaler Inhale 2 puffs into the lungs every 6 (six) hours as needed for wheezing or shortness of breath. 3 Inhaler 3  . allopurinol (ZYLOPRIM) 100 MG tablet TAKE 1 TABLET DAILY 90 tablet 1  . azelastine (ASTELIN) 0.1 % nasal spray Place 2 sprays into both nostrils daily as needed for allergies. Use in each nostril as directed    . BREO ELLIPTA  100-25 MCG/INH AEPB Inhale 1 puff into the lungs daily.   1  . calcium carbonate (OS-CAL) 1250 (500 Ca) MG chewable tablet Chew 1 tablet by mouth daily.    . cholecalciferol (VITAMIN D) 1000 UNITS tablet Take 1,000 Units by mouth daily.    Marland Kitchen COUMADIN 5 MG tablet Take 1 tablet Tuesday, Thursday, and Saturday and  Take 2 tablets all other days. 45 tablet 3  . Darbepoetin Alfa (ARANESP, ALBUMIN FREE, IJ) EVERY THURSDAY    . digoxin (LANOXIN) 0.125 MG tablet Take 1 tablet (0.125 mg total) by mouth every other day. 45 tablet 3  . docusate sodium (COLACE) 100 MG capsule Take 300 mg by mouth at bedtime.    . fluticasone (FLONASE) 50 MCG/ACT nasal spray Place 2 sprays into both nostrils daily. Pt needs now 48 g 1  . folic acid (FOLVITE) 1 MG tablet TAKE 1 TABLET BY MOUTH ONCE DAILY 100 tablet 0  . furosemide (LASIX) 40 MG tablet Take 1 tablet by mouth  daily 90 tablet 1  . gabapentin (NEURONTIN) 300 MG capsule TAKE 1 CAPSULE BY MOUTH AT BEDTIME 90 capsule 0  . guaiFENesin (MUCINEX) 600 MG 12 hr tablet Take 1 tablet (600 mg total) by mouth 2 (two) times daily. 60 tablet 2  . hydrALAZINE (APRESOLINE) 25 MG tablet Take 1 tablet (25 mg total) by mouth 3 (three) times daily. 270 tablet 3  . HYDROcodone-acetaminophen (NORCO/VICODIN)  5-325 MG tablet One tablet every four hours as needed for acute pain.  Limit of five days per [place]. 30 tablet 0  . isosorbide mononitrate (IMDUR) 60 MG 24 hr tablet Take 1 tablet (60 mg total) by mouth daily. 90 tablet 1  . meclizine (ANTIVERT) 25 MG tablet Take 1 tablet (25 mg total) by mouth 3 (three) times daily as needed for dizziness. 30 tablet 0  . metolazone (ZAROXOLYN) 2.5 MG tablet TAKE 1 TABLET ONCE A WEEK 12 tablet 2  . metoprolol succinate (TOPROL XL) 100 MG 24 hr tablet Take 1 tablet (100 mg total) by mouth daily. Take with or immediately following a meal. 30 tablet 5  . Multiple Vitamin (MULTIVITAMIN) LIQD Take 5 mLs by mouth daily.    .  ondansetron (ZOFRAN) 4 MG tablet Take 1 tablet (4 mg total) by mouth daily as needed for nausea or vomiting. 90 tablet 1  . Polyethyl [name] (SYSTANE OP) Place 1 drop into both eyes daily as needed (dry eyes).    . potassium chloride SA (KLOR-CON M20) 20 MEQ tablet Take 1 tablet by mouth  daily 90 tablet 1  . pravastatin (PRAVACHOL) 40 MG tablet Take 1 tablet (40 mg total) by mouth every evening. 90 tablet 3  . SYNTHROID 75 MCG tablet TAKE 1 TABLET DAILY 90 tablet 1  . UNABLE TO FIND Shower chair x 1  Dx high fall risk 1 each 0  . UNABLE TO FIND Nebulizer machine with tubing x 1  Diagnosis: J44.9 1 each 0Nebulizer   No current facility-administered medications for this visit.             Facility-Administered Medications Ordered in Other Visits  Medication Dose Route Frequency Provider Last Rate Last Dose  . epoetin alfa (EPOGEN,PROCRIT) injection 40,000 Units  40,000 Units Subcutaneous Once Farrel Gobble, MD             Past Medical History:  Diagnosis Date  . Adenomatous polyp of colon 12/16/2002   Dr. [name]/[facility]  . Allergy   . Cataract   . CHB (complete heart block) ([place]) 10/07/2014      . CHF (congestive heart failure) ([place])    a.  EF previously reduced to 25-30% b. EF improved to 60-65% by echo in 10/2017.   Marland Kitchen Chronic kidney disease, stage I 2006  . Constipation       . COPD (chronic obstructive pulmonary disease) ([place])       . DJD (degenerative joint disease) of lumbar spine   . DM (diabetes mellitus) ([place]) 2006  . Esophageal reflux   . Glaucoma   . Gout       . H/O heart valve replacement with mechanical valve    a. s/p mechanical AVR in 2001 and mechanical MVR in 2004.  Marland Kitchen H/O wisdom tooth extraction   . Hyperlipemia   . IDA (iron deficiency anemia)    Parenteral iron/[name]  . Insomnia       . Non-ischemic cardiomyopathy ([place])    a. s/p MDT CRTD  . Obesity,  unspecified   . Other and unspecified hyperlipidemia   . Oxygen deficiency 2014   nocturnal;  . Spondylolisthesis   . Spondylosis   . Thyroid cancer ([place])    remote thyroidectomy, no recurrence, pt denies in 2016 thyroid cancer  . Unspecified hypothyroidism       . 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 Vertigo  ROS:   All systems reviewed and negative except as noted in the HPI.        Past Surgical History:  Procedure Laterality Date  . AORTIC AND MITRAL VALVE REPLACEMENT  2001  . BI-VENTRICULAR IMPLANTABLE CARDIOVERTER DEFIBRILLATOR UPGRADE N/A 01/18/2015   Procedure: BI-VENTRICULAR IMPLANTABLE CARDIOVERTER DEFIBRILLATOR UPGRADE;  Surgeon: Evans Lance, MD;  Location: [facility] CATH LAB;  Service: Cardiovascular;  Laterality: N/A;  . CARDIAC CATHETERIZATION    . CARDIAC VALVE REPLACEMENT    . CARDIOVERSION N/A 11/13/2016   Procedure: CARDIOVERSION;  Surgeon: Sanda Klein, MD;  Location: [facility] ENDOSCOPY;  Service: Cardiovascular;  Laterality: N/A;  . COLONOSCOPY  06/12/2007   Rourk- Normal rectum/Normal colon  . COLONOSCOPY  12/16/02   small hemorrhoids  . COLONOSCOPY  06/20/2012   Procedure: COLONOSCOPY;  Surgeon: Daneil Dolin, MD;  Location: [facility] ENDO SUITE;  Service: Endoscopy;  Laterality: N/A;  10:45  . defibrillator implanted 2006    . DOPPLER ECHOCARDIOGRAPHY  2012  . DOPPLER ECHOCARDIOGRAPHY  05,06,07,08,09,2011  . ESOPHAGOGASTRODUODENOSCOPY  06/12/2007   Rourk- normal esophagus, small hiatal hernia, otherwise normal stomach, D1, D2  . EYE SURGERY Right 2005  . EYE SURGERY Left 2006  . ICD LEAD REMOVAL Left 02/08/2015   Procedure: ICD LEAD REMOVAL;  Surgeon: Evans Lance, MD;  Location: [facility] OR;  Service: Cardiovascular;  Laterality: Left;  "Will plan extraction and insertion of a BiV PM"  **Dr. Roxan Hockey backing up case**  . IMPLANTABLE CARDIOVERTER DEFIBRILLATOR (ICD) GENERATOR CHANGE Left  02/08/2015   Procedure: ICD GENERATOR CHANGE;  Surgeon: Evans Lance, MD;  Location: [facility] [place];  Service: Cardiovascular;  Laterality: Left;  . INSERT / REPLACE / REMOVE PACEMAKER    . PACEMAKER INSERTION  May 2016  . pacemaker placed  2004  . right breast cyst removed     benign   . right cataract removed     2005  . TEE WITHOUT CARDIOVERSION N/A 11/13/2016   Procedure: TRANSESOPHAGEAL ECHOCARDIOGRAM (TEE);  Surgeon: Sanda Klein, MD;  Location: [facility] ENDOSCOPY;  Service: Cardiovascular;  Laterality: N/A;  . THYROIDECTOMY            Family History  Problem Relation Age of Onset  . Pancreatic cancer Mother   . Heart disease Father   . Heart disease Sister   . Heart attack Sister   . Heart disease Brother   . Diabetes Brother   . Heart disease Brother   . Diabetes Brother   . Hypertension Brother   . Lung cancer Brother      Social History        Socioeconomic History  . Marital status: Widowed    Spouse name: Not on file  . Number of children: 0  . Years of education: Not on file  . Highest education level: Not on file  Occupational History  . Occupation: retired    Fish farm manager: RETIRED  Social Needs  . Financial resource strain: Not on file  . Food insecurity:    Worry: Not on file    Inability: Not on file  . Transportation needs:    Medical: Not on file    Non-medical: Not on file  Tobacco Use  . Smoking status: Former Smoker    Packs/day: 0.75    Years: 40.00    Pack years: 30.00    Types: Cigarettes    Last attempt to quit: 03/08/1987    Years since quitting: 31.2  . Smokeless tobacco: Never Used  Substance and Sexual Activity  .  Alcohol use: No    Alcohol/week: 0.0 standard drinks  . Drug use: No  . Sexual activity: Not Currently  Lifestyle  . Physical activity:    Days per week: Not on file    Minutes per session: Not on file  . Stress: Not on file  Relationships  . Social connections:     Talks on phone: Not on file    Gets together: Not on file    Attends religious service: Not on file    Active member of club or organization: Not on file    Attends meetings of clubs or organizations: Not on file    Relationship status: Not on file  . Intimate partner violence:    Fear of current or ex partner: Not on file    Emotionally abused: Not on file    Physically abused: Not on file    Forced sexual activity: Not on file  Other Topics Concern  . Not on file  Social History Narrative   From [facility] ([facility])     BP (!) 128/58   Pulse 89   Ht 5\' 7"  (1.702 m)   Wt 201 lb (91.2 kg)   SpO2 95%   BMI 31.48 kg/m   Physical Exam:  Elderly appearing woman, NAD HEENT: Unremarkable Neck:  6 cm JVD, no thyromegally Lymphatics:  No adenopathy Back:  No CVA tenderness Lungs:  Clear with no wheezes HEART:  Regular rate rhythm, no murmurs, no rubs, no clicks Abd:  soft, positive bowel sounds, no organomegally, no rebound, no guarding Ext:  2 plus pulses, no edema, no cyanosis, no clubbing Skin:  No rashes no nodules Neuro:  CN II through XII intact, motor grossly intact  EKG - none  DEVICE  Normal device function.  See PaceArt for details.   Assess/Plan: 1. Chronic systolic heart failure - her symptoms remain class 2. She will continue her current meds. 2. ICD - her medtronic device is working normally although her LV threshold has gradually gone up.  3. Atrial fib - she is now out of rhythm 100% of the time. She will continue her current meds.   Ponciano Ort.   EP Attending  Patient seen and examined. Agree with above. The patient has reached ERI. She has CHB. She has had normalization of her LV function by echo. She has never had a malignant ventricular arrhythmia. She will undergo ICD removal and insertion of a biv PPM. She has chronic elevation of her ventricular pacing leads which have been stable. I have reviewed the  indications/risks/benefits/goals/expectations and she wishes to proceed.  Gregg Taylor,M.D.  Mikle Bosworth.D.

## 2019-01-30 NOTE — Progress Notes (Signed)
During report / [name] [name] reported pt had a hive on left arm axillary and Dr Lovena Le was aware. 1430 hive was resolved.

## 2019-01-30 NOTE — Discharge Instructions (Signed)
Pacemaker Battery Change, Care After  This sheet gives you information about how to care for yourself after your procedure. Your health care provider may also give you more specific instructions. If you have problems or questions, contact your health care provider.  What can I expect after the procedure?  After your procedure, it is common to have:   Pain or soreness at the site where the pacemaker was inserted.   Swelling at the site where the pacemaker was inserted.  Follow these instructions at home:  Incision care     Keep the incision clean and dry.  ? Do not take baths, swim, or use a hot tub until your health care provider approves.  ? You may shower the day after your procedure, or as directed by your health care provider.  ? Pat the area dry with a clean towel. Do not rub the area. This may cause bleeding.   Follow instructions from your health care provider about how to take care of your incision. Make sure you:  ? Wash your hands with soap and water before you change your bandage (dressing). If soap and water are not available, use hand sanitizer.  ? Change your dressing as told by your health care provider.  ? Leave stitches (sutures), skin glue, or adhesive strips in place. These skin closures may need to stay in place for 2 weeks or longer. If adhesive strip edges start to loosen and curl up, you may trim the loose edges. Do not remove adhesive strips completely unless your health care provider tells you to do that.   Check your incision area every day for signs of infection. Check for:  ? More redness, swelling, or pain.  ? More fluid or blood.  ? Warmth.  ? Pus or a bad smell.  Activity   Do not lift anything that is heavier than 10 lb (4.5 kg) until your health care provider says it is okay to do so.   For the first 2 weeks, or as long as told by your health care provider:  ? Avoid lifting your left arm higher than your shoulder.  ? Be gentle when you move your arms over your head. It is okay  to raise your arm to comb your hair.  ? Avoid strenuous exercise.   Ask your health care provider when it is okay to:  ? Resume your normal activities.  ? Return to work or school.  ? Resume sexual activity.  Eating and drinking   Eat a heart-healthy diet. This should include plenty of fresh fruits and vegetables, whole grains, low-fat dairy products, and lean protein like chicken and fish.   Limit alcohol intake to no more than 1 drink a day for non-pregnant women and 2 drinks a day for men. One drink equals 12 oz of beer, 5 oz of wine, or 1 oz of hard liquor.   Check ingredients and nutrition facts on packaged foods and beverages. Avoid the following types of food:  ? Food that is high in salt (sodium).  ? Food that is high in saturated fat, like full-fat dairy or red meat.  ? Food that is high in trans fat, like fried food.  ? Food and drinks that are high in sugar.  Lifestyle   Do not use any products that contain nicotine or tobacco, such as cigarettes and e-cigarettes. If you need help quitting, ask your health care provider.   Take steps to manage and control your weight.     Get regular exercise. Aim for 150 minutes of moderate-intensity exercise (such as walking or yoga) or 75 minutes of vigorous exercise (such as running or swimming) each week.   Manage other health problems, such as diabetes or high blood pressure. Ask your health care provider how you can manage these conditions.  General instructions   Do not drive for 24 hours after your procedure if you were given a medicine to help you relax (sedative).   Take over-the-counter and prescription medicines only as told by your health care provider.   Avoid putting pressure on the area where the pacemaker was placed.   If you need an MRI after your pacemaker has been placed, be sure to tell the health care provider who orders the MRI that you have a pacemaker.   Avoid close and prolonged exposure to electrical devices that have strong  magnetic fields. These include:  ? Cell phones. Avoid keeping them in a pocket near the pacemaker, and try using the ear opposite the pacemaker.  ? MP3 players.  ? Household appliances, like microwaves.  ? Metal detectors.  ? Electric generators.  ? High-tension wires.   Keep all follow-up visits as directed by your health care provider. This is important.  Contact a health care provider if:   You have pain at the incision site that is not relieved by over-the-counter or prescription medicines.   You have any of these around your incision site or coming from it:  ? More redness, swelling, or pain.  ? Fluid or blood.  ? Warmth to the touch.  ? Pus or a bad smell.   You have a fever.   You feel brief, occasional palpitations, light-headedness, or any symptoms that you think might be related to your heart.  Get help right away if:   You experience chest pain that is different from the pain at the pacemaker site.   You develop a red streak that extends above or below the incision site.   You experience shortness of breath.   You have palpitations or an irregular heartbeat.   You have light-headedness that does not go away quickly.   You faint or have dizzy spells.   Your pulse suddenly drops or increases rapidly and does not return to normal.   You begin to gain weight and your legs and ankles swell.  Summary   After your procedure, it is common to have pain, soreness, and some swelling where the pacemaker was inserted.   Make sure to keep your incision clean and dry. Follow instructions from your health care provider about how to take care of your incision.   Check your incision every day for signs of infection, such as more pain or swelling, pus or a bad smell, warmth, or leaking fluid and blood.   Avoid strenuous exercise and lifting your left arm higher than your shoulder for 2 weeks, or as long as told by your health care provider.  This information is not intended to replace advice given to you by  your health care provider. Make sure you discuss any questions you have with your health care provider.  Document Released: 07/02/2013 Document Revised: 08/03/2016 Document Reviewed: 08/03/2016  Elsevier Interactive Patient Education  2019 [employer]

## 2019-01-31 ENCOUNTER — Encounter (HOSPITAL_COMMUNITY): Payer: Self-pay | Admitting: Internal Medicine

## 2019-01-31 ENCOUNTER — Telehealth: Payer: Self-pay

## 2019-01-31 DIAGNOSIS — L603 Nail dystrophy: Secondary | ICD-10-CM | POA: Diagnosis not present

## 2019-01-31 DIAGNOSIS — L851 Acquired keratosis [keratoderma] palmaris et plantaris: Secondary | ICD-10-CM | POA: Diagnosis not present

## 2019-01-31 DIAGNOSIS — E1142 Type 2 diabetes mellitus with diabetic polyneuropathy: Secondary | ICD-10-CM | POA: Diagnosis not present

## 2019-01-31 NOTE — Telephone Encounter (Signed)
Patient called and had device change yesterday by Dr Lovena Le.  She is unsure when to take the dressing off her device wound.  Advised her to look at discharge instructions and she read them during the conversation.  She said she found the information she needed regarding wound care.  She said it looks fine today and had no further questions.

## 2019-02-03 ENCOUNTER — Telehealth: Payer: Self-pay | Admitting: Internal Medicine

## 2019-02-03 NOTE — Telephone Encounter (Signed)
New Message    Pt is calling because she says there is a light that is staying on her Device and shes not sure why   Please call

## 2019-02-03 NOTE — Telephone Encounter (Signed)
Spoke with pt and gave her the number to [facility] support about the lights staying on, on her monitor. The pt also asked me if the stiches has to be taking out or fall out on their on? I told her when she go to her wound check appointment 02/10/2019 the device nurse will look at the wound site. If the stitches needs to be taken out she would do so at that visit. If the stitches heals on its on the nurse will answer all the questions at that appointment. The pt states she understood.

## 2019-02-04 ENCOUNTER — Encounter (HOSPITAL_COMMUNITY): Payer: Self-pay | Admitting: Internal Medicine

## 2019-02-04 NOTE — Telephone Encounter (Signed)
Spoke w/ pt and informed her of RN recommendations. Informed pt of where to come for her wound check appt on 02/10/2019. Pt verbalized understanding.

## 2019-02-04 NOTE — Telephone Encounter (Signed)
LMOVM (DPR) advising that she can take off outer Tegaderm dressing, but leave Steri-strips in place. [facility] phone number if she still has questions or concerns.

## 2019-02-05 ENCOUNTER — Encounter: Payer: Medicare Other | Admitting: Cardiovascular Disease

## 2019-02-05 ENCOUNTER — Encounter: Payer: Self-pay | Admitting: Family Medicine

## 2019-02-05 ENCOUNTER — Ambulatory Visit (INDEPENDENT_AMBULATORY_CARE_PROVIDER_SITE_OTHER): Payer: Medicare Other | Admitting: Family Medicine

## 2019-02-05 VITALS — BP 110/60 | Ht 67.0 in | Wt 192.8 lb

## 2019-02-05 DIAGNOSIS — Z0000 Encounter for general adult medical examination without abnormal findings: Secondary | ICD-10-CM | POA: Diagnosis not present

## 2019-02-05 NOTE — Progress Notes (Signed)
Subjective:   [name] [name] is a 85 y.o. female who presents for Medicare Annual (Subsequent) preventive examination.  Location of Patient: Home Location of Provider: Telehealth Consent was obtain for visit to be over via telehealth. I verified that I am speaking with the correct person using two identifiers.   Review of Systems:    Cardiac Risk Factors include: advanced age (>80men, >37 women);dyslipidemia;obesity (BMI >30kg/m2);hypertension     Objective:     Vitals: BP 110/60   Ht 5\' 7"  (1.702 m)   Wt 192 lb 12.8 oz (87.5 kg)   BMI 30.20 kg/m   Body mass index is 30.2 kg/m.  Advanced Directives 01/30/2019 01/16/2019 11/21/2018 10/24/2018 10/14/2018 09/26/2018 09/26/2018  Does Patient Have a Medical Advance Directive? No No No No No No No  Would patient like information on creating a medical advance directive? No - Patient declined No - Patient declined No - Patient declined No - Patient declined - No - Patient declined No - Patient declined  Pre-existing out of facility DNR order (yellow form or pink MOST form) - - Pink MOST form placed in chart (order not valid for inpatient use) - - - -    Tobacco Social History   Tobacco Use  Smoking Status Former Smoker  . Packs/day: 0.75  . Years: 40.00  . Pack years: 30.00  . Types: Cigarettes  . Last attempt to quit: 03/08/1987  . Years since quitting: 31.9  Smokeless Tobacco Never Used     Counseling given: Yes   Clinical Intake:  Pre-visit preparation completed: Yes  Pain Score: 8 (right knee)     BMI - recorded: 30.2  How often do you need to have someone help you when you read instructions, pamphlets, or other written materials from your doctor or pharmacy?: 1 - Never What is the last grade level you completed in school?: 12  Interpreter Needed?: No     Past Medical History:  Diagnosis Date  . Adenomatous polyp of colon 12/16/2002   Dr. [name]/[facility]  . Allergy   .  Cataract   . CHB (complete heart block) ([place]) 10/07/2014      . CHF (congestive heart failure) ([place])    a.  EF previously reduced to 25-30% b. EF improved to 60-65% by echo in 10/2017.   Marland Kitchen Chronic kidney disease, stage I 2006  . Constipation       . COPD (chronic obstructive pulmonary disease) ([place])       . DJD (degenerative joint disease) of lumbar spine   . DM (diabetes mellitus) ([place]) 2006  . Esophageal reflux   . Glaucoma   . Gout       . H/O heart valve replacement with mechanical valve    a. s/p mechanical AVR in 2001 and mechanical MVR in 2004.  Marland Kitchen H/O wisdom tooth extraction   . Hyperlipemia   . IDA (iron deficiency anemia)    Parenteral iron/[name]  . Insomnia       . Non-ischemic cardiomyopathy ([place])    a. s/p MDT CRTD  . Obesity, unspecified   . Other and unspecified hyperlipidemia   . Oxygen deficiency 2014   nocturnal;  . Spondylolisthesis   . Spondylosis   . Thyroid cancer ([place])    remote thyroidectomy, no recurrence, pt denies in 2016 thyroid cancer  . Unspecified hypothyroidism       . 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 Vertigo  Past Surgical History:  Procedure Laterality Date  . AORTIC AND MITRAL VALVE REPLACEMENT  2001  . BI-VENTRICULAR IMPLANTABLE CARDIOVERTER DEFIBRILLATOR UPGRADE N/A 01/18/2015   Procedure: BI-VENTRICULAR IMPLANTABLE CARDIOVERTER DEFIBRILLATOR UPGRADE;  Surgeon: Evans Lance, MD;  Location: [facility] CATH LAB;  Service: Cardiovascular;  Laterality: N/A;  . BIV ICD GENERATOR CHANGEOUT N/A 01/30/2019   Procedure: BIV ICD GENERATOR CHANGEOUT;  Surgeon: Evans Lance, MD;  Location: [place] CV LAB;  Service: Cardiovascular;  Laterality: N/A;  . CARDIAC CATHETERIZATION    . CARDIAC VALVE REPLACEMENT    . CARDIOVERSION N/A 11/13/2016   Procedure: CARDIOVERSION;  Surgeon: Sanda Klein, MD;  Location: [facility] ENDOSCOPY;  Service: Cardiovascular;  Laterality: N/A;  . COLONOSCOPY  06/12/2007   Rourk-  Normal rectum/Normal colon  . COLONOSCOPY  12/16/02   small hemorrhoids  . COLONOSCOPY  06/20/2012   Procedure: COLONOSCOPY;  Surgeon: Daneil Dolin, MD;  Location: [facility] ENDO SUITE;  Service: Endoscopy;  Laterality: N/A;  10:45  . defibrillator implanted 2006    . DOPPLER ECHOCARDIOGRAPHY  2012  . DOPPLER ECHOCARDIOGRAPHY  05,06,07,08,09,2011  . ESOPHAGOGASTRODUODENOSCOPY  06/12/2007   Rourk- normal esophagus, small hiatal hernia, otherwise normal stomach, D1, D2  . EYE SURGERY Right 2005  . EYE SURGERY Left 2006  . ICD LEAD REMOVAL Left 02/08/2015   Procedure: ICD LEAD REMOVAL;  Surgeon: Evans Lance, MD;  Location: [facility] OR;  Service: Cardiovascular;  Laterality: Left;  "Will plan extraction and insertion of a BiV PM"  **Dr. Roxan Hockey backing up case**  . IMPLANTABLE CARDIOVERTER DEFIBRILLATOR (ICD) GENERATOR CHANGE Left 02/08/2015   Procedure: ICD GENERATOR CHANGE;  Surgeon: Evans Lance, MD;  Location: [facility];  Service: Cardiovascular;  Laterality: Left;  . INSERT / REPLACE / REMOVE PACEMAKER    . PACEMAKER INSERTION  May 2016  . pacemaker placed  2004  . right breast cyst removed     benign   . right cataract removed     2005  . TEE WITHOUT CARDIOVERSION N/A 11/13/2016   Procedure: TRANSESOPHAGEAL ECHOCARDIOGRAM (TEE);  Surgeon: Sanda Klein, MD;  Location: [facility] ENDOSCOPY;  Service: Cardiovascular;  Laterality: N/A;  . THYROIDECTOMY     Family History  Problem Relation Age of Onset  . Pancreatic cancer Mother   . Heart disease Father   . Heart disease Sister   . Heart attack Sister   . Heart disease Brother   . Diabetes Brother   . Heart disease Brother   . Diabetes Brother   . Hypertension Brother   . Lung cancer Brother    Social History   Socioeconomic History  . Marital status: Widowed    Spouse name: Not on file  . Number of children: 0  . Years of education: Not on file  . Highest education level: Not on file  Occupational History  . Occupation: retired     Fish farm manager: RETIRED  Social Needs  . Financial resource strain: Somewhat hard  . Food insecurity:    Worry: Never true    Inability: Never true  . Transportation needs:    Medical: No    Non-medical: No  Tobacco Use  . Smoking status: Former Smoker    Packs/day: 0.75    Years: 40.00    Pack years: 30.00    Types: Cigarettes    Last attempt to quit: 03/08/1987    Years since quitting: 31.9  . Smokeless tobacco: Never Used  Substance and Sexual Activity  . Alcohol use: No  Alcohol/week: 0.0 standard drinks  . Drug use: No  . Sexual activity: Not Currently  Lifestyle  . Physical activity:    Days per week: 2 days    Minutes per session: 20 min  . Stress: Not at all  Relationships  . Social connections:    Talks on phone: More than three times a week    Gets together: Never    Attends religious service: More than 4 times per year    Active member of club or organization: No    Attends meetings of clubs or organizations: Never    Relationship status: Widowed  Other Topics Concern  . Not on file  Social History Narrative   From [place] (2004)    Outpatient Encounter Medications as of 02/05/2019  Medication Sig  . acetaminophen (TYLENOL) 500 MG tablet Take 500 mg by mouth every 6 (six) hours as needed (pain).  Marland Kitchen albuterol (PROVENTIL HFA;VENTOLIN HFA) 108 (90 Base) MCG/ACT inhaler INHALE 2 PUFFS INTO THE LUNGS EVERY 6 HOURS AS NEEDED FOR WHEEZING OR SHORTNESS OF BREATH (Patient taking differently: Inhale 2 puffs into the lungs every 6 (six) hours as needed for wheezing or shortness of breath. )  . albuterol (PROVENTIL) (2.5 MG/3ML) 0.083% nebulizer solution INHALE 1 VIAL VIA NEBULIZER THREE TIMES DAILY. (Patient taking differently: Take 2.5 mg by nebulization 2 (two) times daily. )  . allopurinol (ZYLOPRIM) 100 MG tablet TAKE 1 TABLET(100 MG) BY MOUTH DAILY (Patient taking differently: Take 100 mg by mouth daily. )  . cholecalciferol (VITAMIN D3) 25 MCG (1000 UT) tablet Take  1,000 Units by mouth daily.  Marland Kitchen COUMADIN 5 MG tablet Take 2 tablets daily except 1 tablet on Tuesdays and Saturdays (Patient taking differently: Take 5-10 mg by mouth See admin instructions. Take 10 mg daily at night except on Saturdays take 5 mg at night)  . diphenhydramine-acetaminophen (TYLENOL PM) 25-500 MG TABS tablet Take 1 tablet by mouth at bedtime as needed (sleep).  . docusate sodium (COLACE) 100 MG capsule Take 300 mg by mouth at bedtime as needed for mild constipation.   . fluticasone (FLONASE) 50 MCG/ACT nasal spray SHAKE LIQUID AND USE 2 SPRAYS IN EACH NOSTRIL DAILY (Patient taking differently: Place 2 sprays into both nostrils daily as needed for allergies. )  . folic acid (FOLVITE) 1 MG tablet Take 1 tablet (1 mg total) by mouth daily. (Patient taking differently: Take 1 mg by mouth at bedtime. )  . furosemide (LASIX) 40 MG tablet Take 1 tablet (40 mg total) by mouth daily. Skip dose today and start from 1/4 (Patient taking differently: Take 40 mg by mouth daily. )  . gabapentin (NEURONTIN) 300 MG capsule TAKE 1 CAPSULE BY MOUTH AT BEDTIME (Patient taking differently: Take 300 mg by mouth at bedtime. )  . hydrALAZINE (APRESOLINE) 25 MG tablet TAKE 1 TABLET THREE TIMES A DAY (Patient taking differently: Take 25 mg by mouth 2 (two) times a day. )  . HYDROcodone-acetaminophen (NORCO) 7.5-325 MG tablet Take 1 tablet by mouth every 6 (six) hours as needed for moderate pain (Must last 30 days.).  Marland Kitchen isosorbide mononitrate (IMDUR) 60 MG 24 hr tablet Take 1 tablet (60 mg total) by mouth daily. (Patient taking differently: Take 60 mg by mouth at bedtime. )  . Liniments (SALONPAS PAIN RELIEF PATCH EX) Apply 1 patch topically daily as needed (pain).  . meclizine (ANTIVERT) 12.5 MG tablet Take 1 tablet (12.5 mg total) by mouth 3 (three) times daily as needed for dizziness.  Marland Kitchen  metolazone (ZAROXOLYN) 2.5 MG tablet Take 1 tablet (2.5 mg total) by mouth 2 (two) times a week. Wednesdays and Saturdays   Skip dose on 1/4 and start from 1/8 (Patient taking differently: Take 2.5 mg by mouth 2 (two) times a week. Wednesdays and Saturdays)  . metoprolol succinate (TOPROL-XL) 100 MG 24 hr tablet TAKE 1 TABLET BY MOUTH DAILY. TAKE WITH OR IMMEDIATELY FOLLOWING A MEAL. (Patient taking differently: Take 100 mg by mouth at bedtime. )  . Multiple Vitamin (MULTIVITAMIN) LIQD Take 5 mLs by mouth daily.  . ondansetron (ZOFRAN) 4 MG tablet Take 1 tablet (4 mg total) by mouth daily as needed for nausea or vomiting.  [name] [name] (SYSTANE) 0.4-0.3 % GEL ophthalmic gel Place 1 application into the left eye at bedtime.  . potassium chloride SA (K-DUR) 20 MEQ tablet TAKE 1 TABLET BY MOUTH DAILY (Patient taking differently: Take 20 mEq by mouth daily. )  . pravastatin (PRAVACHOL) 40 MG tablet TAKE 1 TABLET BY MOUTH EVERY EVENING (Patient taking differently: Take 40 mg by mouth at bedtime. )  . SYNTHROID 75 MCG tablet TAKE 1 TABLET DAILY (Patient taking differently: Take 75 mcg by mouth daily before breakfast. )   Facility-Administered Encounter Medications as of 02/05/2019  Medication  . epoetin alfa (EPOGEN,PROCRIT) injection 40,000 Units    Activities of Daily Living In your present state of health, do you have any difficulty performing the following activities: 02/05/2019  Hearing? N  Vision? N  Difficulty concentrating or making decisions? N  Walking or climbing stairs? Y  Dressing or bathing? Y  Doing errands, shopping? Y  Preparing Food and eating ? Y  Using the Toilet? N  In the past six months, have you accidently leaked urine? N  Do you have problems with loss of bowel control? N  Managing your Medications? N  Managing your Finances? N  Housekeeping or managing your Housekeeping? N  Some recent data might be hidden    Patient Care Team: Fayrene Helper, MD as PCP - General (Family Medicine) Croitoru, Dani Gobble, MD as PCP - Cardiology (Cardiology) Evans Lance, MD as  Consulting Physician (Cardiology) Sheldon Silvan, Manon Hilding, PA-C as Physician Assistant (Oncology) Madelin Headings, DO (Optometry) Fran Lowes, MD as Consulting Physician (Nephrology) Nickel, Sharmon Leyden, NP as Nurse Practitioner (Vascular Surgery) Sanjuana Kava, MD as Consulting Physician (Orthopedic Surgery) Herminio Commons, MD as Attending Physician (Cardiology) Early, Arvilla Meres, MD as Consulting Physician (Vascular Surgery) Clista Bernhardt, MD as Consulting Physician [facility]) Sanda Klein, MD as Consulting Physician (Cardiology) System, Provider Not In    Assessment:   This is a routine wellness examination for [place].  Exercise Activities and Dietary recommendations Current Exercise Habits: Home exercise routine, Type of exercise: walking, Time (Minutes): 15, Frequency (Times/Week): 2, Weekly Exercise (Minutes/Week): 30, Intensity: Mild, Exercise limited by: orthopedic condition(s)  Goals    . Exercise 3x per week (30 min per time)     Recommend starting a routine exercise program at least 3 days a week for 30-45 minutes at a time as tolerated.         Fall Risk Fall Risk  02/05/2019 12/15/2018 12/10/2018 09/30/2018 07/17/2018  Falls in the past year? 0 - 1 0 No  Number falls in past yr: - - 1 - -  Injury with Fall? 0 - 1 - -  Risk for fall due to : - History of fall(s);Impaired balance/gait;Impaired mobility - - -  Follow up - - - - -  Is the patient's home free of loose throw rugs in walkways, pet beds, electrical cords, etc?   yes      Grab bars in the bathroom? yes      Handrails on the stairs?   yes      Adequate lighting?   yes  Timed Get Up and Go performed: telemedicine not performs Depression Screen PHQ 2/9 Scores 02/05/2019 12/10/2018 09/30/2018 07/17/2018  PHQ - 2 Score 0 1 0 1  PHQ- 9 Score - - 4 -     Cognitive Function     6CIT Screen 02/05/2019 02/01/2018 03/12/2017  What Year? 0 points 0 points 0 points  What month? 0 points 0  points 0 points  What time? 0 points 0 points 0 points  Count back from 20 0 points 0 points 0 points  Months in reverse 0 points 2 points 0 points  Repeat phrase 0 points 2 points 0 points  Total Score 0 4 0    Immunization History  Administered Date(s) Administered  . Influenza Split 06/19/2011, 07/17/2012  . Influenza Whole 06/20/2007, 06/22/2008, 06/24/2009, 05/27/2010  . Influenza, High Dose Seasonal PF 07/17/2018  . Influenza,inj,Quad PF,6+ Mos 07/15/2013, 06/03/2014, 06/21/2015, 07/06/2016, 11/17/2016, 06/29/2017  . Pneumococcal Conjugate-13 10/13/2014  . Pneumococcal Polysaccharide-23 06/10/2008  . Td 08/31/2004  . Tdap 02/17/2015    Qualifies for Shingles Vaccine? Declined  Screening Tests Health Maintenance  Topic Date Due  . OPHTHALMOLOGY EXAM  07/03/2017  . INFLUENZA VACCINE  04/26/2019  . HEMOGLOBIN A1C  05/21/2019  . FOOT EXAM  07/18/2019  . TETANUS/TDAP  02/16/2025  . DEXA SCAN  Completed  . PNA vac Low Risk Adult  Completed    Cancer Screenings: Lung: Low Dose CT Chest recommended if Age 28-80 years, 30 pack-year currently smoking OR have quit w/in 15years. Patient does not qualify. Breast:  Up to date on Mammogram? Yes   Up to date of Bone Density/Dexa? Yes Colorectal: aged out  Additional Screenings:  Hepatitis C Screening: Dr [name] can add to next lab check     Plan:      1. Encounter for Medicare annual wellness exam  I have personally reviewed and noted the following in the patient's chart:   . Medical and social history . Use of alcohol, tobacco or illicit drugs  . Current medications and supplements . Functional ability and status . Nutritional status . Physical activity . Advanced directives . List of other physicians . Hospitalizations, surgeries, and ER visits in previous 12 months . Vitals . Screenings to include cognitive, depression, and falls . Referrals and appointments  In addition, I have reviewed and discussed with  patient certain preventive protocols, quality metrics, and best practice recommendations. A written personalized care plan for preventive services as well as general preventive health recommendations were provided to patient.   I provided 20 minutes of non-face-to-face time during this encounter.   Perlie Mayo, NP  02/05/2019

## 2019-02-05 NOTE — Patient Instructions (Addendum)
[name] , Thank you for taking time to come for your Medicare Wellness Visit. I appreciate your ongoing commitment to your health goals. Please review the following plan we discussed and let me know if I can assist you in the future.   Screening recommendations/referrals: Colonoscopy: Completed. Aged out unless you start having symptoms Mammogram: Due 2020 Bone Density: Up to date  Recommended yearly ophthalmology/optometry visit for glaucoma screening and checkup Recommended yearly dental visit for hygiene and checkup  Vaccinations: Influenza vaccine: Due Fall 2020 Pneumococcal vaccine: Completed Tdap vaccine: Up to date Shingles vaccine: Declined  Advanced directives: form given previously, let us know if you would like to discuss this at the office  Conditions/risks identified: Falls    Preventive Care 65 Years and Older, Female Preventive care refers to lifestyle choices and visits with your health care provider that can promote health and wellness. What does preventive care include?  A yearly physical exam. This is also called an annual well check.  Dental exams once or twice a year.  Routine eye exams. Ask your health care provider how often you should have your eyes checked.  Personal lifestyle choices, including:  Daily care of your teeth and gums.  Regular physical activity.  Eating a healthy diet.  Avoiding tobacco and drug use.  Limiting alcohol use.  Practicing safe sex.  Taking low-dose aspirin every day.  Taking vitamin and mineral supplements as recommended by your health care provider. What happens during an annual well check? The services and screenings done by your health care provider during your annual well check will depend on your age, overall health, lifestyle risk factors, and family history of disease. Counseling  Your health care provider may ask you questions about your:  Alcohol use.  Tobacco use.  Drug use.  Emotional  well-being.  Home and relationship well-being.  Sexual activity.  Eating habits.  History of falls.  Memory and ability to understand (cognition).  Work and work Statistician.  Reproductive health. Screening  You may have the following tests or measurements:  Height, weight, and BMI.  Blood pressure.  Lipid and cholesterol levels. These may be checked every 5 years, or more frequently if you are over 83 years old.  Skin check.  Lung cancer screening. You may have this screening every year starting at age 50 if you have a 30-pack-year history of smoking and currently smoke or have quit within the past 15 years.  Fecal occult blood test (FOBT) of the stool. You may have this test every year starting at age 9.  Flexible sigmoidoscopy or colonoscopy. You may have a sigmoidoscopy every 5 years or a colonoscopy every 10 years starting at age 11.  Hepatitis C blood test.  Hepatitis B blood test.  Sexually transmitted disease (STD) testing.  Diabetes screening. This is done by checking your blood sugar (glucose) after you have not eaten for a while (fasting). You may have this done every 1-3 years.  Bone density scan. This is done to screen for osteoporosis. You may have this done starting at age 28.  Mammogram. This may be done every 1-2 years. Talk to your health care provider about how often you should have regular mammograms. Talk with your health care provider about your test results, treatment options, and if necessary, the need for more tests. Vaccines  Your health care provider may recommend certain vaccines, such as:  Influenza vaccine. This is recommended every year.  Tetanus, diphtheria, and acellular pertussis (Tdap, Td) vaccine. You  may need a Td booster every 10 years.  Zoster vaccine. You may need this after age 27.  Pneumococcal 13-valent conjugate (PCV13) vaccine. One dose is recommended after age 30.  Pneumococcal polysaccharide (PPSV23) vaccine. One  dose is recommended after age 83. Talk to your health care provider about which screenings and vaccines you need and how often you need them. This information is not intended to replace advice given to you by your health care provider. Make sure you discuss any questions you have with your health care provider. Document Released: 10/08/2015 Document Revised: 05/31/2016 Document Reviewed: 07/13/2015 Elsevier Interactive Patient Education  2017 [place] Prevention in the Home Falls can cause injuries. They can happen to people of all ages. There are many things you can do to make your home safe and to help prevent falls. What can I do on the outside of my home?  Regularly fix the edges of walkways and driveways and fix any cracks.  Remove anything that might make you trip as you walk through a door, such as a raised step or threshold.  Trim any bushes or trees on the path to your home.  Use bright outdoor lighting.  Clear any walking paths of anything that might make someone trip, such as rocks or tools.  Regularly check to see if handrails are loose or broken. Make sure that both sides of any steps have handrails.  Any raised decks and porches should have guardrails on the edges.  Have any leaves, snow, or ice cleared regularly.  Use sand or salt on walking paths during winter.  Clean up any spills in your garage right away. This includes oil or grease spills. What can I do in the bathroom?  Use night lights.  Install grab bars by the toilet and in the tub and shower. Do not use towel bars as grab bars.  Use non-skid mats or decals in the tub or shower.  If you need to sit down in the shower, use a plastic, non-slip stool.  Keep the floor dry. Clean up any water that spills on the floor as soon as it happens.  Remove soap buildup in the tub or shower regularly.  Attach bath mats securely with double-sided non-slip rug tape.  Do not have throw rugs and other  things on the floor that can make you trip. What can I do in the bedroom?  Use night lights.  Make sure that you have a light by your bed that is easy to reach.  Do not use any sheets or blankets that are too big for your bed. They should not hang down onto the floor.  Have a firm chair that has side arms. You can use this for support while you get dressed.  Do not have throw rugs and other things on the floor that can make you trip. What can I do in the kitchen?  Clean up any spills right away.  Avoid walking on wet floors.  Keep items that you use a lot in easy-to-reach places.  If you need to reach something above you, use a strong step stool that has a grab bar.  Keep electrical cords out of the way.  Do not use floor polish or wax that makes floors slippery. If you must use wax, use non-skid floor wax.  Do not have throw rugs and other things on the floor that can make you trip. What can I do with my stairs?  Do not leave any items  on the stairs.  Make sure that there are handrails on both sides of the stairs and use them. Fix handrails that are broken or loose. Make sure that handrails are as long as the stairways.  Check any carpeting to make sure that it is firmly attached to the stairs. Fix any carpet that is loose or worn.  Avoid having throw rugs at the top or bottom of the stairs. If you do have throw rugs, attach them to the floor with carpet tape.  Make sure that you have a light switch at the top of the stairs and the bottom of the stairs. If you do not have them, ask someone to add them for you. What else can I do to help prevent falls?  Wear shoes that:  Do not have high heels.  Have rubber bottoms.  Are comfortable and fit you well.  Are closed at the toe. Do not wear sandals.  If you use a stepladder:  Make sure that it is fully opened. Do not climb a closed stepladder.  Make sure that both sides of the stepladder are locked into place.  Ask  someone to hold it for you, if possible.  Clearly mark and make sure that you can see:  Any grab bars or handrails.  First and last steps.  Where the edge of each step is.  Use tools that help you move around (mobility aids) if they are needed. These include:  Canes.  Walkers.  Scooters.  Crutches.  Turn on the lights when you go into a dark area. Replace any light bulbs as soon as they burn out.  Set up your furniture so you have a clear path. Avoid moving your furniture around.  If any of your floors are uneven, fix them.  If there are any pets around you, be aware of where they are.  Review your medicines with your doctor. Some medicines can make you feel dizzy. This can increase your chance of falling. Ask your doctor what other things that you can do to help prevent falls. This information is not intended to replace advice given to you by your health care provider. Make sure you discuss any questions you have with your health care provider. Document Released: 07/08/2009 Document Revised: 02/17/2016 Document Reviewed: 10/16/2014 Elsevier Interactive Patient Education  2017 [employer].

## 2019-02-07 ENCOUNTER — Telehealth: Payer: Self-pay | Admitting: *Deleted

## 2019-02-07 NOTE — Telephone Encounter (Signed)
° ° °  COVID-19 Pre-Screening Questions: ° °• In the past 7 to 10 days have you had a cough,  shortness of breath, headache, congestion, fever, body aches, chills, sore throat, or sudden loss of taste or sense of smell?   NO °•  °• Have you been around anyone with known Covid 19.  NO °•  °• Have you been around anyone who is awaiting Covid 19 test results in the past 7 to 10 days?  NO °•  °• Have you been around anyone who has been exposed to Covid 19, or has mentioned symptoms of Covid 19 within the past 7 to 10 days?  NO °•  ° °If you have any concerns about symptoms your patients report please contact your leadership team, or the provider the patient is seeing in the office for further guidance.   ° ° ° °   ° ° ° ° °

## 2019-02-10 ENCOUNTER — Ambulatory Visit (INDEPENDENT_AMBULATORY_CARE_PROVIDER_SITE_OTHER): Payer: Medicare Other | Admitting: *Deleted

## 2019-02-10 ENCOUNTER — Other Ambulatory Visit: Payer: Self-pay

## 2019-02-10 DIAGNOSIS — I442 Atrioventricular block, complete: Secondary | ICD-10-CM | POA: Diagnosis not present

## 2019-02-10 DIAGNOSIS — Z952 Presence of prosthetic heart valve: Secondary | ICD-10-CM

## 2019-02-10 DIAGNOSIS — Z5181 Encounter for therapeutic drug level monitoring: Secondary | ICD-10-CM

## 2019-02-10 LAB — CUP PACEART INCLINIC DEVICE CHECK
Battery Remaining Longevity: 70 mo
Battery Voltage: 3.19 V
Brady Statistic AP VP Percent: 0 %
Brady Statistic AP VS Percent: 0 %
Brady Statistic AS VP Percent: 99.56 %
Brady Statistic AS VS Percent: 0.44 %
Brady Statistic RA Percent Paced: 0 %
Brady Statistic RV Percent Paced: 99.56 %
Date Time Interrogation Session: 20200518171326
Implantable Lead Implant Date: 20040521
Implantable Lead Implant Date: 20071218
Implantable Lead Implant Date: 20160516
Implantable Lead Location: 753858
Implantable Lead Location: 753859
Implantable Lead Location: 753860
Implantable Lead Model: 5076
Implantable Lead Model: 6947
Implantable Pulse Generator Implant Date: 20200506
Lead Channel Impedance Value: 304 Ohm
Lead Channel Impedance Value: 304 Ohm
Lead Channel Impedance Value: 361 Ohm
Lead Channel Impedance Value: 380 Ohm
Lead Channel Impedance Value: 551 Ohm
Lead Channel Impedance Value: 570 Ohm
Lead Channel Impedance Value: 608 Ohm
Lead Channel Impedance Value: 665 Ohm
Lead Channel Impedance Value: 684 Ohm
Lead Channel Impedance Value: 722 Ohm
Lead Channel Pacing Threshold Amplitude: 1.5 V
Lead Channel Pacing Threshold Amplitude: 2.5 V
Lead Channel Pacing Threshold Pulse Width: 0.8 ms
Lead Channel Pacing Threshold Pulse Width: 1 ms
Lead Channel Setting Pacing Amplitude: 3 V
Lead Channel Setting Pacing Amplitude: 3.25 V
Lead Channel Setting Pacing Pulse Width: 0.8 ms
Lead Channel Setting Pacing Pulse Width: 1 ms
Lead Channel Setting Sensing Sensitivity: 5.6 mV

## 2019-02-10 LAB — POCT INR: INR: 2.3 (ref 2.0–3.0)

## 2019-02-10 MED ORDER — COUMADIN 5 MG PO TABS: ORAL_TABLET | ORAL | 3 refills | Status: DC

## 2019-02-10 NOTE — Patient Instructions (Signed)
Continue coumadin 2 tablets daily except 1 tablet on Saturdays.  Continue greens  Recheck in 4 weeks.

## 2019-02-10 NOTE — Progress Notes (Signed)
BIV pacing @ 99.6%(Effective 60.6%). Wound check appointment. Steri-strips removed. Wound without redness or edema. Incision edges approximated, wound well healed. Normal device function. Thresholds, sensing, and impedances consistent with implant measurements. Device programmed RV at 3 V @ 0.8 mv, LV programmed at 3.25 V @ 1.00 ms. S/P gen change. Histogram distribution appropriate for patient and level of activity. Known persistent AF ,+ Coumadin. No  high ventricular rates noted. Patient educated about wound care, arm mobility, lifting restrictions, and monitor. ROV in 3 months with implanting physician.

## 2019-02-11 ENCOUNTER — Other Ambulatory Visit: Payer: Self-pay | Admitting: Family Medicine

## 2019-02-11 ENCOUNTER — Telehealth: Payer: Self-pay

## 2019-02-11 NOTE — Telephone Encounter (Signed)
Spoke with patient to remind of missed remote transmission 

## 2019-02-12 ENCOUNTER — Other Ambulatory Visit (HOSPITAL_COMMUNITY): Payer: Self-pay | Admitting: *Deleted

## 2019-02-12 ENCOUNTER — Telehealth: Payer: Self-pay | Admitting: Family Medicine

## 2019-02-12 DIAGNOSIS — N184 Chronic kidney disease, stage 4 (severe): Secondary | ICD-10-CM

## 2019-02-12 DIAGNOSIS — D649 Anemia, unspecified: Secondary | ICD-10-CM

## 2019-02-12 DIAGNOSIS — D631 Anemia in chronic kidney disease: Secondary | ICD-10-CM

## 2019-02-12 DIAGNOSIS — E611 Iron deficiency: Secondary | ICD-10-CM

## 2019-02-12 NOTE — Telephone Encounter (Signed)
Pt is calling wants some Tessalon Pearls called in for her cough

## 2019-02-13 ENCOUNTER — Inpatient Hospital Stay (HOSPITAL_BASED_OUTPATIENT_CLINIC_OR_DEPARTMENT_OTHER): Payer: Medicare Other | Admitting: Hematology

## 2019-02-13 ENCOUNTER — Inpatient Hospital Stay (HOSPITAL_COMMUNITY): Payer: Medicare Other

## 2019-02-13 ENCOUNTER — Other Ambulatory Visit: Payer: Self-pay

## 2019-02-13 ENCOUNTER — Encounter (HOSPITAL_COMMUNITY): Payer: Self-pay | Admitting: Hematology

## 2019-02-13 ENCOUNTER — Other Ambulatory Visit: Payer: Self-pay | Admitting: Family Medicine

## 2019-02-13 ENCOUNTER — Inpatient Hospital Stay (HOSPITAL_COMMUNITY): Payer: Medicare Other | Attending: Hematology

## 2019-02-13 VITALS — BP 135/56 | HR 64 | Temp 97.7°F | Resp 20 | Wt 199.8 lb

## 2019-02-13 DIAGNOSIS — N184 Chronic kidney disease, stage 4 (severe): Secondary | ICD-10-CM

## 2019-02-13 DIAGNOSIS — D631 Anemia in chronic kidney disease: Secondary | ICD-10-CM | POA: Insufficient documentation

## 2019-02-13 DIAGNOSIS — Z950 Presence of cardiac pacemaker: Secondary | ICD-10-CM | POA: Diagnosis not present

## 2019-02-13 DIAGNOSIS — D509 Iron deficiency anemia, unspecified: Secondary | ICD-10-CM

## 2019-02-13 DIAGNOSIS — R110 Nausea: Secondary | ICD-10-CM | POA: Insufficient documentation

## 2019-02-13 DIAGNOSIS — Z87891 Personal history of nicotine dependence: Secondary | ICD-10-CM | POA: Diagnosis not present

## 2019-02-13 DIAGNOSIS — D649 Anemia, unspecified: Secondary | ICD-10-CM

## 2019-02-13 DIAGNOSIS — E611 Iron deficiency: Secondary | ICD-10-CM

## 2019-02-13 LAB — CBC WITH DIFFERENTIAL/PLATELET
Abs Immature Granulocytes: 0.02 10*3/uL (ref 0.00–0.07)
Basophils Absolute: 0 10*3/uL (ref 0.0–0.1)
Basophils Relative: 0 %
Eosinophils Absolute: 0 10*3/uL (ref 0.0–0.5)
Eosinophils Relative: 0 %
HCT: 32 % — ABNORMAL LOW (ref 36.0–46.0)
Hemoglobin: 9.7 g/dL — ABNORMAL LOW (ref 12.0–15.0)
Immature Granulocytes: 0 %
Lymphocytes Relative: 12 %
Lymphs Abs: 0.7 10*3/uL (ref 0.7–4.0)
MCH: 29.3 pg (ref 26.0–34.0)
MCHC: 30.3 g/dL (ref 30.0–36.0)
MCV: 96.7 fL (ref 80.0–100.0)
Monocytes Absolute: 0.3 10*3/uL (ref 0.1–1.0)
Monocytes Relative: 6 %
Neutro Abs: 4.4 10*3/uL (ref 1.7–7.7)
Neutrophils Relative %: 82 %
Platelets: 176 10*3/uL (ref 150–400)
RBC: 3.31 MIL/uL — ABNORMAL LOW (ref 3.87–5.11)
RDW: 16.1 % — ABNORMAL HIGH (ref 11.5–15.5)
WBC: 5.4 10*3/uL (ref 4.0–10.5)
nRBC: 0 % (ref 0.0–0.2)

## 2019-02-13 LAB — LACTATE DEHYDROGENASE: LDH: 189 U/L (ref 98–192)

## 2019-02-13 LAB — COMPREHENSIVE METABOLIC PANEL
ALT: 12 U/L (ref 0–44)
AST: 24 U/L (ref 15–41)
Albumin: 4 g/dL (ref 3.5–5.0)
Alkaline Phosphatase: 59 U/L (ref 38–126)
Anion gap: 12 (ref 5–15)
BUN: 49 mg/dL — ABNORMAL HIGH (ref 8–23)
CO2: 29 mmol/L (ref 22–32)
Calcium: 9.6 mg/dL (ref 8.9–10.3)
Chloride: 95 mmol/L — ABNORMAL LOW (ref 98–111)
Creatinine, Ser: 1.5 mg/dL — ABNORMAL HIGH (ref 0.44–1.00)
GFR calc Af Amer: 37 mL/min — ABNORMAL LOW (ref >60)
GFR calc non Af Amer: 32 mL/min — ABNORMAL LOW (ref >60)
Glucose, Bld: 100 mg/dL — ABNORMAL HIGH (ref 70–99)
Potassium: 3.8 mmol/L (ref 3.5–5.1)
Sodium: 136 mmol/L (ref 135–145)
Total Bilirubin: 0.5 mg/dL (ref 0.3–1.2)
Total Protein: 8.1 g/dL (ref 6.5–8.1)

## 2019-02-13 LAB — FOLATE: Folate: 60 ng/mL (ref >5.9)

## 2019-02-13 LAB — FERRITIN: Ferritin: 369 ng/mL — ABNORMAL HIGH (ref 11–307)

## 2019-02-13 LAB — VITAMIN B12: Vitamin B-12: 544 pg/mL (ref 180–914)

## 2019-02-13 MED ORDER — EPOETIN ALFA 40000 UNIT/ML IJ SOLN
40000.0000 [IU] | Freq: Once | INTRAMUSCULAR | Status: AC
  Administered 2019-02-13: 40000 [IU] via SUBCUTANEOUS

## 2019-02-13 MED ORDER — EPOETIN ALFA 40000 UNIT/ML IJ SOLN
INTRAMUSCULAR | Status: AC
  Filled 2019-02-13: qty 1

## 2019-02-13 MED ORDER — BENZONATATE 100 MG PO CAPS: 100.0000 mg | ORAL_CAPSULE | Freq: Two times a day (BID) | ORAL | 0 refills | Status: DC | PRN

## 2019-02-13 NOTE — Progress Notes (Signed)
[name] [name] presents today for injection per MD orders. Procrit 40,000 units  administered SQ in right Upper Arm. Administration without incident. Patient tolerated well.

## 2019-02-13 NOTE — Assessment & Plan Note (Addendum)
1.  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  She has been on Procrit that initially started ~3 years ago, with overall stability in her hemoglobin.  She is now on  Procrit injections 40,000 u  every 6 weeks.   -Hemoglobin is 9.7.  Ferritin is 369.  She is taking iron tablet twice daily.  We will continue Procrit 40,000 units every 4 weeks.  - RTC in 3 mths.   2. CKD - sCr is stable at  1.50. Continue follow up with nephrology.  3. Hx of Diverticulitis: - Follows with PCP.

## 2019-02-13 NOTE — Telephone Encounter (Signed)
Noted  

## 2019-02-13 NOTE — Progress Notes (Signed)
[place], [place] [address]   CLINIC:  Medical Oncology/Hematology  PCP:  Fayrene Helper, MD [address], [place] [address] [phone]   REASON FOR VISIT:  Follow-up for Anemia in stage 4 chronic kidney disease     INTERVAL HISTORY:  [name] [name] 85 y.o. female returns for routine follow-up. She is here today alone. She states that she states that she has experienced nausea at times since her last visit. She states that she has itching on her leg. Denies any nausea, vomiting, or diarrhea. Denies any new pains. Had not noticed any recent bleeding such as epistaxis, hematuria or hematochezia. Denies recent chest pain on exertion, shortness of breath on minimal exertion, pre-syncopal episodes, or palpitations. Denies any numbness or tingling in hands or feet. Denies any recent fevers, infections, or recent hospitalizations. Patient reports appetite at 75% and energy level at 0%.    REVIEW OF SYSTEMS:  Review of Systems  Gastrointestinal: Positive for nausea.  Skin: Positive for rash.  Neurological: Positive for numbness.     PAST MEDICAL/SURGICAL HISTORY:  Past Medical History:  Diagnosis Date  . Adenomatous polyp of colon 12/16/2002   Dr. [name]/[facility]  . Allergy   . Cataract   . CHB (complete heart block) ([place]) 10/07/2014      . CHF (congestive heart failure) ([place])    a.  EF previously reduced to 25-30% b. EF improved to 60-65% by echo in 10/2017.   Marland Kitchen Chronic kidney disease, stage I 2006  . Constipation       . COPD (chronic obstructive pulmonary disease) ([place])       . DJD (degenerative joint disease) of lumbar spine   . DM (diabetes mellitus) ([place]) 2006  . Esophageal reflux   . Glaucoma   . Gout       . H/O heart valve replacement with mechanical valve    a. s/p mechanical AVR in 2001 and mechanical MVR in 2004.  Marland Kitchen H/O wisdom tooth extraction   . Hyperlipemia   .  IDA (iron deficiency anemia)    Parenteral iron/[name]  . Insomnia       . Non-ischemic cardiomyopathy ([place])    a. s/p MDT CRTD  . Obesity, unspecified   . Other and unspecified hyperlipidemia   . Oxygen deficiency 2014   nocturnal;  . Spondylolisthesis   . Spondylosis   . Thyroid cancer ([place])    remote thyroidectomy, no recurrence, pt denies in 2016 thyroid cancer  . Unspecified hypothyroidism       . 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 Vertigo        Past Surgical History:  Procedure Laterality Date  . AORTIC AND MITRAL VALVE REPLACEMENT  2001  . BI-VENTRICULAR IMPLANTABLE CARDIOVERTER DEFIBRILLATOR UPGRADE N/A 01/18/2015   Procedure: BI-VENTRICULAR IMPLANTABLE CARDIOVERTER DEFIBRILLATOR UPGRADE;  Surgeon: Evans Lance, MD;  Location: [facility] CATH LAB;  Service: Cardiovascular;  Laterality: N/A;  . BIV ICD GENERATOR CHANGEOUT N/A 01/30/2019   Procedure: BIV ICD GENERATOR CHANGEOUT;  Surgeon: Evans Lance, MD;  Location: [facility] CV LAB;  Service: Cardiovascular;  Laterality: N/A;  . CARDIAC CATHETERIZATION    . CARDIAC VALVE REPLACEMENT    . CARDIOVERSION N/A 11/13/2016   Procedure: CARDIOVERSION;  Surgeon: Sanda Klein, MD;  Location: [facility] ENDOSCOPY;  Service: Cardiovascular;  Laterality: N/A;  . COLONOSCOPY  06/12/2007   Rourk- Normal rectum/Normal colon  .  COLONOSCOPY  12/16/02   small hemorrhoids  . COLONOSCOPY  06/20/2012   Procedure: COLONOSCOPY;  Surgeon: Daneil Dolin, MD;  Location: [facility] ENDO SUITE;  Service: Endoscopy;  Laterality: N/A;  10:45  . defibrillator implanted 2006    . DOPPLER ECHOCARDIOGRAPHY  2012  . DOPPLER ECHOCARDIOGRAPHY  05,06,07,08,09,2011  . ESOPHAGOGASTRODUODENOSCOPY  06/12/2007   Rourk- normal esophagus, small hiatal hernia, otherwise normal stomach, D1, D2  . EYE SURGERY Right 2005  . EYE SURGERY Left 2006  . ICD LEAD REMOVAL Left 02/08/2015   Procedure: ICD LEAD REMOVAL;  Surgeon: Evans Lance, MD;   Location: [facility] OR;  Service: Cardiovascular;  Laterality: Left;  "Will plan extraction and insertion of a BiV PM"  **Dr. Roxan Hockey backing up case**  . IMPLANTABLE CARDIOVERTER DEFIBRILLATOR (ICD) GENERATOR CHANGE Left 02/08/2015   Procedure: ICD GENERATOR CHANGE;  Surgeon: Evans Lance, MD;  Location: [facility];  Service: Cardiovascular;  Laterality: Left;  . INSERT / REPLACE / REMOVE PACEMAKER    . PACEMAKER INSERTION  May 2016  . pacemaker placed  2004  . right breast cyst removed     benign   . right cataract removed     2005  . TEE WITHOUT CARDIOVERSION N/A 11/13/2016   Procedure: TRANSESOPHAGEAL ECHOCARDIOGRAM (TEE);  Surgeon: Sanda Klein, MD;  Location: [facility] ENDOSCOPY;  Service: Cardiovascular;  Laterality: N/A;  . THYROIDECTOMY       SOCIAL HISTORY:  Social History   Socioeconomic History  . Marital status: Widowed    Spouse name: Not on file  . Number of children: 0  . Years of education: Not on file  . Highest education level: Not on file  Occupational History  . Occupation: retired    Fish farm manager: RETIRED  Social Needs  . Financial resource strain: Somewhat hard  . Food insecurity:    Worry: Never true    Inability: Never true  . Transportation needs:    Medical: No    Non-medical: No  Tobacco Use  . Smoking status: Former Smoker    Packs/day: 0.75    Years: 40.00    Pack years: 30.00    Types: Cigarettes    Last attempt to quit: 03/08/1987    Years since quitting: 31.9  . Smokeless tobacco: Never Used  Substance and Sexual Activity  . Alcohol use: No    Alcohol/week: 0.0 standard drinks  . Drug use: No  . Sexual activity: Not Currently  Lifestyle  . Physical activity:    Days per week: 2 days    Minutes per session: 20 min  . Stress: Not at all  Relationships  . Social connections:    Talks on phone: More than three times a week    Gets together: Never    Attends religious service: More than 4 times per year    Active member of club or organization:  No    Attends meetings of clubs or organizations: Never    Relationship status: Widowed  . Intimate partner violence:    Fear of current or ex partner: No    Emotionally abused: No    Physically abused: No    Forced sexual activity: No  Other Topics Concern  . Not on file  Social History Narrative   From [place] (2004)    FAMILY HISTORY:  Family History  Problem Relation Age of Onset  . Pancreatic cancer Mother   . Heart disease Father   . Heart disease Sister   . Heart attack Sister   .  Heart disease Brother   . Diabetes Brother   . Heart disease Brother   . Diabetes Brother   . Hypertension Brother   . Lung cancer Brother     CURRENT MEDICATIONS:  Outpatient Encounter Medications as of 02/13/2019  Medication Sig  . acetaminophen (TYLENOL) 500 MG tablet Take 500 mg by mouth every 6 (six) hours as needed (pain).  Marland Kitchen albuterol (PROVENTIL HFA;VENTOLIN HFA) 108 (90 Base) MCG/ACT inhaler INHALE 2 PUFFS INTO THE LUNGS EVERY 6 HOURS AS NEEDED FOR WHEEZING OR SHORTNESS OF BREATH (Patient taking differently: Inhale 2 puffs into the lungs every 6 (six) hours as needed for wheezing or shortness of breath. )  . albuterol (PROVENTIL) (2.5 MG/3ML) 0.083% nebulizer solution INHALE 1 VIAL VIA NEBULIZER THREE TIMES DAILY. (Patient taking differently: Take 2.5 mg by nebulization 2 (two) times daily. )  . allopurinol (ZYLOPRIM) 100 MG tablet TAKE 1 TABLET(100 MG) BY MOUTH DAILY (Patient taking differently: Take 100 mg by mouth daily. )  . benzonatate (TESSALON) 100 MG capsule Take 1 capsule (100 mg total) by mouth 2 (two) times daily as needed for cough.  . cholecalciferol (VITAMIN D3) 25 MCG (1000 UT) tablet Take 1,000 Units by mouth daily.  Marland Kitchen COUMADIN 5 MG tablet Take 2 tablets daily except 1 tablet on Saturdays  . diphenhydramine-acetaminophen (TYLENOL PM) 25-500 MG TABS tablet Take 1 tablet by mouth at bedtime as needed (sleep).  . docusate sodium (COLACE) 100 MG capsule Take 300 mg by  mouth at bedtime as needed for mild constipation.   . fluticasone (FLONASE) 50 MCG/ACT nasal spray SHAKE LIQUID AND USE 2 SPRAYS IN EACH NOSTRIL DAILY (Patient taking differently: Place 2 sprays into both nostrils daily as needed for allergies. )  . folic acid (FOLVITE) 1 MG tablet Take 1 tablet (1 mg total) by mouth daily. (Patient taking differently: Take 1 mg by mouth at bedtime. )  . furosemide (LASIX) 40 MG tablet Take 1 tablet (40 mg total) by mouth daily. Skip dose today and start from 1/4 (Patient taking differently: Take 40 mg by mouth daily. )  . gabapentin (NEURONTIN) 300 MG capsule TAKE 1 CAPSULE BY MOUTH AT BEDTIME (Patient taking differently: Take 300 mg by mouth at bedtime. )  . hydrALAZINE (APRESOLINE) 25 MG tablet TAKE 1 TABLET THREE TIMES A DAY (Patient taking differently: Take 25 mg by mouth 2 (two) times a day. )  . HYDROcodone-acetaminophen (NORCO) 7.5-325 MG tablet Take 1 tablet by mouth every 6 (six) hours as needed for moderate pain (Must last 30 days.).  Marland Kitchen isosorbide mononitrate (IMDUR) 60 MG 24 hr tablet Take 1 tablet (60 mg total) by mouth daily. (Patient taking differently: Take 60 mg by mouth at bedtime. )  . Liniments (SALONPAS PAIN RELIEF PATCH EX) Apply 1 patch topically daily as needed (pain).  . meclizine (ANTIVERT) 12.5 MG tablet Take 1 tablet (12.5 mg total) by mouth 3 (three) times daily as needed for dizziness.  . metolazone (ZAROXOLYN) 2.5 MG tablet Take 1 tablet (2.5 mg total) by mouth 2 (two) times a week. Wednesdays and Saturdays  Skip dose on 1/4 and start from 1/8 (Patient taking differently: Take 2.5 mg by mouth 2 (two) times a week. Wednesdays and Saturdays)  . metoprolol succinate (TOPROL-XL) 100 MG 24 hr tablet TAKE 1 TABLET BY MOUTH DAILY. TAKE WITH OR IMMEDIATELY FOLLOWING A MEAL. (Patient taking differently: Take 100 mg by mouth at bedtime. )  . Multiple Vitamin (MULTIVITAMIN) LIQD Take 5 mLs by mouth daily.  Marland Kitchen  ondansetron (ZOFRAN) 4 MG tablet Take 1  tablet (4 mg total) by mouth daily as needed for nausea or vomiting.  [name] [name] (SYSTANE) 0.4-0.3 % GEL ophthalmic gel Place 1 application into the left eye at bedtime.  . potassium chloride SA (K-DUR) 20 MEQ tablet TAKE 1 TABLET BY MOUTH DAILY (Patient taking differently: Take 20 mEq by mouth daily. )  . pravastatin (PRAVACHOL) 40 MG tablet TAKE 1 TABLET BY MOUTH EVERY EVENING (Patient taking differently: Take 40 mg by mouth at bedtime. )  . SYNTHROID 75 MCG tablet TAKE 1 TABLET DAILY (Patient taking differently: Take 75 mcg by mouth daily before breakfast. )   Facility-Administered Encounter Medications as of 02/13/2019  Medication  . epoetin alfa (EPOGEN,PROCRIT) injection 40,000 Units    ALLERGIES:  Allergies  Allergen Reactions  . Penicillins Other (See Comments)    Bruise Did it involve swelling of the face/tongue/throat, SOB, or low BP? No Did it involve sudden or severe rash/hives, skin peeling, or any reaction on the inside of your mouth or nose? No Did you need to seek medical attention at a hospital or doctor's office? No When did it last happen?20+ years If all above answers are "NO", may proceed with cephalosporin use.   . Aspirin Other (See Comments)    On coumadin  . Fentanyl Dermatitis    Rash with patch   . Niacin Itching     PHYSICAL EXAM:  ECOG Performance status: 1  Vitals:   02/13/19 1132  BP: (!) 135/56  Pulse: 64  Resp: 20  Temp: 97.7 F (36.5 C)  SpO2: 93%   Filed Weights   02/13/19 1132  Weight: 199 lb 12.8 oz (90.6 kg)    Physical Exam Vitals signs reviewed.  Constitutional:      Appearance: Normal appearance.  Cardiovascular:     Rate and Rhythm: Normal rate.     Heart sounds: Normal heart sounds.  Pulmonary:     Effort: Pulmonary effort is normal.     Breath sounds: Normal breath sounds.  Abdominal:     Palpations: Abdomen is soft.  Musculoskeletal:        General: No swelling.  Skin:    General:  Skin is warm.  Neurological:     General: No focal deficit present.     Mental Status: She is alert and oriented to person, place, and time.  Psychiatric:        Mood and Affect: Mood normal.        Behavior: Behavior normal.      LABORATORY DATA:  I have reviewed the labs as listed.  CBC    Component Value Date/Time   WBC 5.4 02/13/2019 1047   RBC 3.31 (L) 02/13/2019 1047   HGB 9.7 (L) 02/13/2019 1047   HGB 9.6 (L) 01/28/2019 1218   HCT 32.0 (L) 02/13/2019 1047   HCT 29.9 (L) 01/28/2019 1218   PLT 176 02/13/2019 1047   PLT 199 01/28/2019 1218   MCV 96.7 02/13/2019 1047   MCV 90 01/28/2019 1218   MCH 29.3 02/13/2019 1047   MCHC 30.3 02/13/2019 1047   RDW 16.1 (H) 02/13/2019 1047   RDW 17.1 (H) 01/28/2019 1218   LYMPHSABS 0.7 02/13/2019 1047   LYMPHSABS 0.7 01/28/2019 1218   MONOABS 0.3 02/13/2019 1047   EOSABS 0.0 02/13/2019 1047   EOSABS 0.0 01/28/2019 1218   BASOSABS 0.0 02/13/2019 1047   BASOSABS 0.0 01/28/2019 1218   CMP Latest Ref Rng & Units 02/13/2019 01/28/2019  11/20/2018  Glucose 70 - 99 mg/dL 100(H) 117(H) 82  BUN 8 - 23 mg/dL 49(H) 52(H) 63(H)  Creatinine 0.44 - 1.00 mg/dL 1.50(H) 1.87(H) 1.66(H)  Sodium 135 - 145 mmol/L 136 132(L) 140  Potassium 3.5 - 5.1 mmol/L 3.8 4.6 4.0  Chloride 98 - 111 mmol/L 95(L) 92(L) 101  CO2 22 - 32 mmol/L 29 31(H) 30  Calcium 8.9 - 10.3 mg/dL 9.6 9.5 9.9  Total Protein 6.5 - 8.1 g/dL 8.1 - 7.2  Total Bilirubin 0.3 - 1.2 mg/dL 0.5 - 0.5  Alkaline Phos 38 - 126 U/L 59 - -  AST 15 - 41 U/L 24 - 23  ALT 0 - 44 U/L 12 - 11       DIAGNOSTIC IMAGING:  I have independently reviewed the scans and discussed with the patient.   I have reviewed Venita Lick LPN's note and agree with the documentation.  I personally performed a face-to-face visit, made revisions and my assessment and plan is as follows.    ASSESSMENT & PLAN:   Iron deficiency 1.  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  She has been on Procrit that initially started ~3 years ago, with overall stability in her hemoglobin.  She is now on  Procrit injections 40,000 u  every 6 weeks.   -Hemoglobin is 9.7.  Ferritin is 369.  She is taking iron tablet twice daily.  We will continue Procrit 40,000 units every 4 weeks.  - RTC in 3 mths.   2. CKD - sCr is stable at  1.50. Continue follow up with nephrology.  3. Hx of Diverticulitis: - Follows with PCP.      Orders placed this encounter:  Orders Placed This Encounter  Procedures  . CBC with Differential/Platelet  . Comprehensive metabolic panel  . Lactate dehydrogenase  . Iron and TIBC  . Ferritin      Derek Jack, MD [place] [phone]

## 2019-02-13 NOTE — Patient Instructions (Addendum)
[facility] at [facility] Discharge Instructions  You were seen today by Dr. Katragadda. He went over your recent lab results. He will see you back in 3 months for labs and follow up.   Thank you for choosing [facility] at [facility] to provide your oncology and hematology care.  To afford each patient quality time with our provider, please arrive at least 15 minutes before your scheduled appointment time.   If you have a lab appointment with the Cancer Center please come in thru the  Main Entrance and check in at the main information desk  You need to re-schedule your appointment should you arrive 10 or more minutes late.  We strive to give you quality time with our providers, and arriving late affects you and other patients whose appointments are after yours.  Also, if you no show three or more times for appointments you may be dismissed from the clinic at the providers discretion.     Again, thank you for choosing [facility].  Our hope is that these requests will decrease the amount of time that you wait before being seen by our physicians.       _____________________________________________________________  Should you have questions after your visit to [facility], please contact our office at [phone] between the hours of 8:00 a.m. and 4:30 p.m.  Voicemails left after 4:00 p.m. will not be returned until the following business day.  For prescription refill requests, have your pharmacy contact our office and allow 72 hours.    Cancer Center Support Programs:   > Cancer Support Group  2nd Tuesday of the month 1pm-2pm, Journey Room    

## 2019-02-13 NOTE — Progress Notes (Signed)
Tessalon perle

## 2019-02-13 NOTE — Telephone Encounter (Signed)
Sent to [facility], if she needs at [facility] pls re submit and let her know , thanks

## 2019-02-14 ENCOUNTER — Other Ambulatory Visit: Payer: Self-pay | Admitting: Cardiovascular Disease

## 2019-02-14 LAB — PROTEIN ELECTROPHORESIS, SERUM
A/G Ratio: 1 (ref 0.7–1.7)
Albumin ELP: 3.8 g/dL (ref 2.9–4.4)
Alpha-1-Globulin: 0.3 g/dL (ref 0.0–0.4)
Alpha-2-Globulin: 0.6 g/dL (ref 0.4–1.0)
Beta Globulin: 1.2 g/dL (ref 0.7–1.3)
Gamma Globulin: 1.8 g/dL (ref 0.4–1.8)
Globulin, Total: 3.8 g/dL (ref 2.2–3.9)
Total Protein ELP: 7.6 g/dL (ref 6.0–8.5)

## 2019-02-14 NOTE — Telephone Encounter (Signed)
This is Dr. Victorino December pt and he prescribed this medication. Please address

## 2019-02-18 NOTE — Progress Notes (Signed)
No ICM remote transmission received for 02/10/2019 and next ICM transmission scheduled for 02/25/2019.

## 2019-02-20 ENCOUNTER — Other Ambulatory Visit: Payer: Self-pay | Admitting: Family Medicine

## 2019-02-25 ENCOUNTER — Ambulatory Visit (INDEPENDENT_AMBULATORY_CARE_PROVIDER_SITE_OTHER): Payer: Medicare Other

## 2019-02-25 DIAGNOSIS — I5032 Chronic diastolic (congestive) heart failure: Secondary | ICD-10-CM

## 2019-02-25 DIAGNOSIS — Z95810 Presence of automatic (implantable) cardiac defibrillator: Secondary | ICD-10-CM | POA: Diagnosis not present

## 2019-02-26 ENCOUNTER — Telehealth: Payer: Self-pay

## 2019-02-26 NOTE — Telephone Encounter (Signed)
Spoke with patient to remind of missed remote transmission 

## 2019-02-28 NOTE — Progress Notes (Signed)
EPIC Encounter for ICM Monitoring  Patient Name: [name] [name] is a 85 y.o. female Date: 02/28/2019 Primary Care Physican: Fayrene Helper, MD Primary Cardiologist:Croitoru Electrophysiologist:Taylor Bi-V Pacing:99.7% Last Weight:195.8lbs         Transmission reviewed. Post battery replacement.   Thoracic impedance normal.  Prescribed:Furosemide40 mg 1 tablet daily. Metolazaone 2.5 mg Take 1tablet twicea week (Wednesday and Saturdays). Potassium 20 mEq take 1 tablet daily.  Labs: 11/20/2018 Creatinine 1.66, BUN 63, Potassium 4.0, Sodium 140, GFR 28-32 09/26/2018 Creatinine 1.45, BUN 40, Potassium 4.1, Sodium 129, GFR 33-38 08/29/2018 Creatinine 1.85, BUN 49, Potassium 4.2, Sodium 134, GFR 25-28 08/15/2018 Creatinine 1.63, BUN 43, Potassium 4.1, Sodium 135, GFR 28-32 07/30/2018 Creatinine 1.58, BUN 49, Potassium 4.0, Sodium 135, GFR 29-34 07/09/2018 Creatinine 1.60, BUN 48, Potassium 1.9, Sodium 136, GFR 29-34 04/09/2018 Creatinine 1.91, BUN 53, Potassium 4.4, Sodium 135 03/30/2018 Creatinine 1.78, BUN 54, Potassium 3.9, Sodium 133, GFR 25-29 12/11/2017 Creatinine1.89, BUN44, Potassium4.2, Sodium139, BVQ94-50 A complete set of results can be found in Results Review.  Recommendations:None  Follow-up plan: ICM clinic phone appointment on7/02/2019.   Copy of ICM check sent to Dr.Taylor.  3 month ICM trend: 02/26/2019    1 Year ICM trend:       [name], RN 02/28/2019 1:26 PM

## 2019-03-10 ENCOUNTER — Ambulatory Visit (INDEPENDENT_AMBULATORY_CARE_PROVIDER_SITE_OTHER): Payer: Medicare Other | Admitting: *Deleted

## 2019-03-10 DIAGNOSIS — Z952 Presence of prosthetic heart valve: Secondary | ICD-10-CM | POA: Diagnosis not present

## 2019-03-10 DIAGNOSIS — Z5181 Encounter for therapeutic drug level monitoring: Secondary | ICD-10-CM | POA: Diagnosis not present

## 2019-03-10 LAB — POCT INR: INR: 2.1 (ref 2.0–3.0)

## 2019-03-10 NOTE — Patient Instructions (Signed)
Continue coumadin 2 tablets daily except 1 tablet on Saturdays.  Continue greens  Recheck in 4 weeks.

## 2019-03-12 ENCOUNTER — Other Ambulatory Visit (HOSPITAL_COMMUNITY): Payer: Self-pay | Admitting: *Deleted

## 2019-03-12 ENCOUNTER — Telehealth (HOSPITAL_COMMUNITY): Payer: Self-pay | Admitting: Radiology

## 2019-03-12 DIAGNOSIS — N184 Chronic kidney disease, stage 4 (severe): Secondary | ICD-10-CM

## 2019-03-12 DIAGNOSIS — E611 Iron deficiency: Secondary | ICD-10-CM

## 2019-03-12 DIAGNOSIS — D631 Anemia in chronic kidney disease: Secondary | ICD-10-CM

## 2019-03-12 DIAGNOSIS — D649 Anemia, unspecified: Secondary | ICD-10-CM

## 2019-03-12 NOTE — Telephone Encounter (Signed)
Spoke with patient, will get her scheduled at [place]. Will call patient and schedule.

## 2019-03-13 ENCOUNTER — Inpatient Hospital Stay (HOSPITAL_COMMUNITY): Payer: Medicare Other

## 2019-03-13 ENCOUNTER — Inpatient Hospital Stay (HOSPITAL_COMMUNITY): Payer: Medicare Other | Attending: Hematology

## 2019-03-13 ENCOUNTER — Other Ambulatory Visit: Payer: Self-pay

## 2019-03-13 VITALS — BP 127/79 | HR 60 | Temp 98.1°F | Resp 18

## 2019-03-13 DIAGNOSIS — E611 Iron deficiency: Secondary | ICD-10-CM

## 2019-03-13 DIAGNOSIS — D631 Anemia in chronic kidney disease: Secondary | ICD-10-CM | POA: Diagnosis not present

## 2019-03-13 DIAGNOSIS — N184 Chronic kidney disease, stage 4 (severe): Secondary | ICD-10-CM

## 2019-03-13 DIAGNOSIS — D649 Anemia, unspecified: Secondary | ICD-10-CM

## 2019-03-13 LAB — CBC
HCT: 31.1 % — ABNORMAL LOW (ref 36.0–46.0)
Hemoglobin: 9.7 g/dL — ABNORMAL LOW (ref 12.0–15.0)
MCH: 29.8 pg (ref 26.0–34.0)
MCHC: 31.2 g/dL (ref 30.0–36.0)
MCV: 95.4 fL (ref 80.0–100.0)
Platelets: 173 10*3/uL (ref 150–400)
RBC: 3.26 MIL/uL — ABNORMAL LOW (ref 3.87–5.11)
RDW: 15.9 % — ABNORMAL HIGH (ref 11.5–15.5)
WBC: 4.9 10*3/uL (ref 4.0–10.5)
nRBC: 0 % (ref 0.0–0.2)

## 2019-03-13 MED ORDER — EPOETIN ALFA 40000 UNIT/ML IJ SOLN
40000.0000 [IU] | Freq: Once | INTRAMUSCULAR | Status: AC
  Administered 2019-03-13: 40000 [IU] via SUBCUTANEOUS
  Filled 2019-03-13: qty 1

## 2019-03-13 NOTE — Progress Notes (Signed)
[name] [name] presents today for Procrit injection. Hgb 9.7. Pt tolerated injection without incident or complaint. VSS. Copy of lab work and schedule given to patient. Discharged via wheelchair by staff in satisfactory condition.

## 2019-03-13 NOTE — Patient Instructions (Signed)
[place] at [facility]  Discharge Instructions:  Procrit injection received today. Hgb 9.7. _______________________________________________________________  Thank you for choosing [place] at [facility] to provide your oncology and hematology care.  To afford each patient quality time with our providers, please arrive at least 15 minutes before your scheduled appointment.  You need to re-schedule your appointment if you arrive 10 or more minutes late.  We strive to give you quality time with our providers, and arriving late affects you and other patients whose appointments are after yours.  Also, if you no show three or more times for appointments you may be dismissed from the clinic.  Again, thank you for choosing [facility] at [facility] hope is that these requests will allow you access to exceptional care and in a timely manner. _______________________________________________________________  If you have questions after your visit, please contact our office at [phone] between the hours of 8:30 a.m. and 5:00 p.m. Voicemails left after 4:30 p.m. will not be returned until the following business day. _______________________________________________________________  For prescription refill requests, have your pharmacy contact our office. _______________________________________________________________  Recommendations made by the consultant and any test results will be sent to your referring physician. _______________________________________________________________

## 2019-03-14 ENCOUNTER — Telehealth (HOSPITAL_COMMUNITY): Payer: Self-pay | Admitting: *Deleted

## 2019-03-14 NOTE — Telephone Encounter (Signed)
The above patient or their representative was contacted and gave the following answers to these questions:         Do you have any of the following symptoms?  no  Fever                    Cough                   Shortness of breath  Do  you have any of the following other symptoms? no   muscle pain         vomiting,        diarrhea        rash         weakness        red eye        abdominal pain         bruising          bruising or bleeding              joint pain           severe headache    Have you been in contact with someone who was or has been sick in the past 2 weeks?  no  Yes                 Unsure                         Unable to assess   Does the person that you were in contact with have any of the following symptoms?   Cough         shortness of breath           muscle pain         vomiting,            diarrhea            rash            weakness           fever            red eye           abdominal pain           bruising  or  bleeding                joint pain                severe headache               Have you  or someone you have been in contact with traveled internationally in th last month?   no      If yes, which countries?   Have you  or someone you have been in contact with traveled outside [place] in th last month?   no      If yes, which state and city?   COMMENTS OR ACTION PLAN FOR THIS PATIENT:          

## 2019-03-17 ENCOUNTER — Other Ambulatory Visit: Payer: Self-pay | Admitting: *Deleted

## 2019-03-17 ENCOUNTER — Ambulatory Visit (HOSPITAL_COMMUNITY)
Admission: RE | Admit: 2019-03-17 | Discharge: 2019-03-17 | Disposition: A | Discharge status: Home / Self Care | Payer: Medicare Other | Source: Ambulatory Visit | Attending: Family | Admitting: Family

## 2019-03-17 ENCOUNTER — Encounter: Payer: Self-pay | Admitting: Family

## 2019-03-17 ENCOUNTER — Other Ambulatory Visit: Payer: Self-pay

## 2019-03-17 ENCOUNTER — Ambulatory Visit (INDEPENDENT_AMBULATORY_CARE_PROVIDER_SITE_OTHER): Payer: Medicare Other | Admitting: Family

## 2019-03-17 VITALS — BP 145/61 | HR 60 | Temp 97.1°F | Resp 16 | Ht 67.0 in | Wt 190.0 lb

## 2019-03-17 DIAGNOSIS — I714 Abdominal aortic aneurysm, without rupture, unspecified: Secondary | ICD-10-CM

## 2019-03-17 DIAGNOSIS — I255 Ischemic cardiomyopathy: Secondary | ICD-10-CM

## 2019-03-17 MED ORDER — COUMADIN 5 MG PO TABS: ORAL_TABLET | ORAL | 3 refills | Status: DC

## 2019-03-17 NOTE — Patient Instructions (Signed)
Before your next abdominal ultrasound: ° °Avoid gas forming foods and beverages the day before the test.   °Take two Extra-Strength Gas-X capsules at bedtime the night before the test. °Take another two Extra-Strength Gas-X capsules in the middle of the night if you get up to the restroom, if not, first thing in the morning with water.  °Do not chew gum.  ° ° ° ° °Abdominal Aortic Aneurysm ° °An aneurysm is a bulge in one of the blood vessels that carry blood away from the heart (artery). It happens when blood pushes up against a weak or damaged place in the wall of an artery. An abdominal aortic aneurysm happens in the main artery of the body (aorta). °Some aneurysms may not cause problems. If it grows, it can burst or tear, causing bleeding inside the body. This is an emergency. It needs to be treated right away. °What are the causes? °The exact cause of this condition is not known. °What increases the risk? °The following may make you more likely to get this condition: °· Being a female who is 60 years of age or older. °· Being white (Caucasian). °· Using tobacco. °· Having a family history of aneurysms. °· Having the following conditions: °? Hardening of the arteries (arteriosclerosis). °? Inflammation of the walls of an artery (arteritis). °? Certain genetic conditions. °? Being very overweight (obesity). °? An infection in the wall of the aorta (infectious aortitis). °? High cholesterol. °? High blood pressure (hypertension). °What are the signs or symptoms? °Symptoms depend on the size of the aneurysm and how fast it is growing. Most grow slowly and do not cause any symptoms. If symptoms do occur, they may include: °· Pain in the belly (abdomen), side, or back. °· Feeling full after eating only small amounts of food. °· Feeling a throbbing lump in the belly. °Symptoms that the aneurysm has burst (ruptured) include: °· Sudden, very bad pain in the belly, side, or back. °· Feeling sick to your stomach  (nauseous). °· Throwing up (vomiting). °· Feeling light-headed or passing out. °How is this treated? °Treatment for this condition depends on: °· The size of the aneurysm. °· How fast it is growing. °· Your age. °· Your risk of having it burst. °If your aneurysm is smaller than 2 inches (5 cm), your doctor may manage it by: °· Checking it often to see if it is getting bigger. You may have an imaging test (ultrasound) to check it every 3-6 months, every year, or every few years. °· Giving you medicines to: °? Control blood pressure. °? Treat pain. °? Fight infection. °If your aneurysm is larger than 2 inches (5 cm), you may need surgery to fix it. °Follow these instructions at home: °Lifestyle °· Do not use any products that have nicotine or tobacco in them. This includes cigarettes, e-cigarettes, and chewing tobacco. If you need help quitting, ask your doctor. °· Get regular exercise. Ask your doctor what types of exercise are best for you. °Eating and drinking °· Eat a heart-healthy diet. This includes eating plenty of: °? Fresh fruits and vegetables. °? Whole grains. °? Low-fat (lean) protein. °? Low-fat dairy products. °· Avoid foods that are high in saturated fat and cholesterol. These foods include red meat and some dairy products. °· Do not drink alcohol if: °? Your doctor tells you not to drink. °? You are pregnant, may be pregnant, or are planning to become pregnant. °· If you drink alcohol: °? Limit how much you   use to: °§ 0-1 drink a day for women. °§ 0-2 drinks a day for men. °? Be aware of how much alcohol is in your drink. In the [place] one drink equals any of these: °§ One typical bottle of beer (12 oz). °§ One-half glass of wine (5 oz). °§ One shot of hard liquor (1½ oz). °General instructions °· Take over-the-counter and prescription medicines only as told by your doctor. °· Keep your blood pressure within normal limits. Ask your doctor what your blood pressure should be. °· Have your blood sugar  (glucose) level and cholesterol levels checked regularly. Keep your blood sugar level and cholesterol levels within normal limits. °· Avoid heavy lifting and activities that take a lot of effort. Ask your doctor what activities are safe for you. °· Keep all follow-up visits as told by your doctor. This is important. °? Talk to your doctor about regular screenings to see if the aneurysm is getting bigger. °Contact a doctor if you: °· Have pain in your belly, side, or back. °· Have a throbbing feeling in your belly. °· Have a family history of aneurysms. °Get help right away if you: °· Have sudden, bad pain in your belly, side, or back. °· Feel sick to your stomach. °· Throw up. °· Have trouble pooping (constipation). °· Have trouble peeing (urinating). °· Feel light-headed. °· Have a fast heart rate when you stand. °· Have sweaty skin that is cold to the touch (clammy). °· Have shortness of breath. °· Have a fever. °These symptoms may be an emergency. Do not wait to see if the symptoms will go away. Get medical help right away. Call your local emergency services (911 in the [place] Do not drive yourself to the hospital. °Summary °· An aneurysm is a bulge in one of the blood vessels that carry blood away from the heart (artery). Some aneurysms may not cause problems. °· You may need to have yours checked often. If it grows, it can burst or tear. This causes bleeding inside the body. It needs to be treated right away. °· Follow instructions from your doctor about healthy lifestyle changes. °· Keep all follow-up visits as told by your doctor. This is important. °This information is not intended to replace advice given to you by your health care provider. Make sure you discuss any questions you have with your health care provider. °Document Released: 01/06/2013 Document Revised: 04/20/2018 Document Reviewed: 04/20/2018 °Elsevier Interactive Patient Education © 2019 [employer] Inc[employer] ° °

## 2019-03-17 NOTE — Progress Notes (Signed)
[facility] [place]   CC: Follow up Abdominal Aortic Aneurysm  History of Present Illness  [name] [name] is a 85 y.o. (Apr 05, 1932) female who is known toDr. Junius Finner prior evaluation of lower extremity vascular pathology in 2010. At that time he recommended observation only and she is done quite well since thattime.  She does complain of right leg pain and spinal stenosis. Had a CT of her lumbar lumbar spine in March 2017. This showed a4.0 cm infrarenal abdominal aortic aneurysm with a slight increase in size since her prior CT in 2013. The patient had no prior knowledge of this. No family history of aneurysm. She does have significant cardiac disease but no history of peripheral vascular occlusive disease.  She denies abdominal pain, denies any new back pain. She states she has bursitis in her right hip with bothers her when she lays on it.   Dr. Donnetta Hutching last evaluated pt on 01-18-16. At that time heindicatedthat she has minimal risk of rupture at thecurrent size of her AAA; he did explain long-term follow-upwiththe natural history of slow progressive growth. Dr. Rennis Golden at her advanced age that itwill be unlikely that she would require repair; herecommended that we see her again in one year with ultrasound to rule out expansion. Dr. Rufina Falco discuss the symptoms of leaking aneurysm she knows to seek emergency medical careshould this occur.  Pt states she was told that she needs a right knee replacement, but surgeon is reluctant due to her being on coumadin,her cardiac history,and her advanced age.  Her walking is limited by this, uses a quad cane.  She denies any current chest pain or any worse dyspnea than usual.   She had a recent AICD removal and new pacemaker insertion.   She states that she is steady on her feet with walking.   She does not seem to haveclaudication type symptoms in her legs with walking. Shedeniesanyhistory of  stroke or TIA symptoms.  She is taking coumadin since her aortic and mitral valve replacement. She also takes a statin.  She states that the right lobe of her thyroid was surgically removed in the 1960's due to hyperthyroidism. She has bilateral exophthalmos.   Diabetic:No, states she was diabetic until about 2014, is diet controlled now Tobacco TLX:BWIOMB smoker, quitabout 1988, smoked about 40years    Past Medical History:  Diagnosis Date  . Adenomatous polyp of colon 12/16/2002   Dr. [name]/[facility]  . Allergy   . Cataract   . CHB (complete heart block) ([place]) 10/07/2014      . CHF (congestive heart failure) ([place])    a.  EF previously reduced to 25-30% b. EF improved to 60-65% by echo in 10/2017.   Marland Kitchen Chronic kidney disease, stage I 2006  . Constipation       . COPD (chronic obstructive pulmonary disease) ([place])       . DJD (degenerative joint disease) of lumbar spine   . DM (diabetes mellitus) ([place]) 2006  . Esophageal reflux   . Glaucoma   . Gout       . H/O heart valve replacement with mechanical valve    a. s/p mechanical AVR in 2001 and mechanical MVR in 2004.  Marland Kitchen H/O wisdom tooth extraction   . Hyperlipemia   . IDA (iron deficiency anemia)    Parenteral iron/[name]  . Insomnia       . Non-ischemic cardiomyopathy ([place])    a. s/p MDT CRTD  .  Obesity, unspecified   . Other and unspecified hyperlipidemia   . Oxygen deficiency 2014   nocturnal;  . Spondylolisthesis   . Spondylosis   . Thyroid cancer ([place])    remote thyroidectomy, no recurrence, pt denies in 2016 thyroid cancer  . Unspecified hypothyroidism       . 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 Vertigo        Past Surgical History:  Procedure Laterality Date  . AORTIC AND MITRAL VALVE REPLACEMENT  2001  . BI-VENTRICULAR IMPLANTABLE CARDIOVERTER DEFIBRILLATOR UPGRADE N/A 01/18/2015   Procedure: BI-VENTRICULAR IMPLANTABLE  CARDIOVERTER DEFIBRILLATOR UPGRADE;  Surgeon: Evans Lance, MD;  Location[facility] C[facility] LAB;  Service: Cardiovascular;  Laterality: N/A;  . BIV ICD GENERATOR CHANGEOUT N/A 01/30/2019   Procedure: BIV ICD GENERATOR CHANGEOUT;  Surgeon: Evans Lance, MD;  Location: [place] CV LAB;  Service: Cardiovascular;  Laterality: N/A;  . CARDIAC CATHETERIZATION    . CARDIAC VALVE REPLACEMENT    . CARDIOVERSION N/A 11/13/2016   Procedure: CARDIOVERSION;  Surgeon: Sanda Klein, MD;  Location: [facility] ENDOSCOPY;  Service: Cardiovascular;  Laterality: N/A;  . COLONOSCOPY  06/12/2007   Rourk- Normal rectum/Normal colon  . COLONOSCOPY  12/16/02   small hemorrhoids  . COLONOSCOPY  06/20/2012   Procedure: COLONOSCOPY;  Surgeon: Daneil Dolin, MD;  Location: [facility] ENDO SUITE;  Service: Endoscopy;  Laterality: N/A;  10:45  . defibrillator implanted 2006    . DOPPLER ECHOCARDIOGRAPHY  2012  . DOPPLER ECHOCARDIOGRAPHY  05,06,07,08,09,2011  . ESOPHAGOGASTRODUODENOSCOPY  06/12/2007   Rourk- normal esophagus, small hiatal hernia, otherwise normal stomach, D1, D2  . EYE SURGERY Right 2005  . EYE SURGERY Left 2006  . ICD LEAD REMOVAL Left 02/08/2015   Procedure: ICD LEAD REMOVAL;  Surgeon: Evans Lance, MD;  Location: [facility] OR;  Service: Cardiovascular;  Laterality: Left;  "Will plan extraction and insertion of a BiV PM"  **Dr. Roxan Hockey backing up case**  . IMPLANTABLE CARDIOVERTER DEFIBRILLATOR (ICD) GENERATOR CHANGE Left 02/08/2015   Procedure: ICD GENERATOR CHANGE;  Surgeon: Evans Lance, MD;  Location: [place];  Service: Cardiovascular;  Laterality: Left;  . INSERT / REPLACE / REMOVE PACEMAKER    . PACEMAKER INSERTION  May 2016  . pacemaker placed  2004  . right breast cyst removed     benign   . right cataract removed     2005  . TEE WITHOUT CARDIOVERSION N/A 11/13/2016   Procedure: TRANSESOPHAGEAL ECHOCARDIOGRAM (TEE);  Surgeon: Sanda Klein, MD;  Location: [facility] ENDOSCOPY;  Service: Cardiovascular;  Laterality:  N/A;  . THYROIDECTOMY     Social History Social History   Socioeconomic History  . Marital status: Widowed    Spouse name: Not on file  . Number of children: 0  . Years of education: Not on file  . Highest education level: Not on file  Occupational History  . Occupation: retired    Fish farm manager: RETIRED  Social Needs  . Financial resource strain: Somewhat hard  . Food insecurity    Worry: Never true    Inability: Never true  . Transportation needs    Medical: No    Non-medical: No  Tobacco Use  . Smoking status: Former Smoker    Packs/day: 0.75    Years: 40.00    Pack years: 30.00    Types: Cigarettes    Quit date: 03/08/1987    Years since quitting: 32.0  . Smokeless tobacco: Never Used  Substance and Sexual Activity  .  Alcohol use: No    Alcohol/week: 0.0 standard drinks  . Drug use: No  . Sexual activity: Not Currently  Lifestyle  . Physical activity    Days per week: 2 days    Minutes per session: 20 min  . Stress: Not at all  Relationships  . Social connections    Talks on phone: More than three times a week    Gets together: Never    Attends religious service: More than 4 times per year    Active member of club or organization: No    Attends meetings of clubs or organizations: Never    Relationship status: Widowed  . Intimate partner violence    Fear of current or ex partner: No    Emotionally abused: No    Physically abused: No    Forced sexual activity: No  Other Topics Concern  . Not on file  Social History Narrative   From [place] ([place])   Family History Family History  Problem Relation Age of Onset  . Pancreatic cancer Mother   . Heart disease Father   . Heart disease Sister   . Heart attack Sister   . Heart disease Brother   . Diabetes Brother   . Heart disease Brother   . Diabetes Brother   . Hypertension Brother   . Lung cancer Brother     Current Outpatient Medications on File Prior to Visit  Medication Sig Dispense Refill  .  acetaminophen (TYLENOL) 500 MG tablet Take 500 mg by mouth every 6 (six) hours as needed (pain).    Marland Kitchen albuterol (PROVENTIL HFA;VENTOLIN HFA) 108 (90 Base) MCG/ACT inhaler INHALE 2 PUFFS INTO THE LUNGS EVERY 6 HOURS AS NEEDED FOR WHEEZING OR SHORTNESS OF BREATH (Patient taking differently: Inhale 2 puffs into the lungs every 6 (six) hours as needed for wheezing or shortness of breath. ) 54 g 2  . albuterol (PROVENTIL) (2.5 MG/3ML) 0.083% nebulizer solution INHALE 1 VIAL VIA NEBULIZER THREE TIMES DAILY. (Patient taking differently: Take 2.5 mg by nebulization 2 (two) times daily. ) 90 vial 5  . allopurinol (ZYLOPRIM) 100 MG tablet TAKE 1 TABLET(100 MG) BY MOUTH DAILY (Patient taking differently: Take 100 mg by mouth daily. ) 90 tablet 1  . BREO ELLIPTA 100-25 MCG/INH AEPB TAKE 1 INHALATION BY MOUTH ONCE DAILY    . cholecalciferol (VITAMIN D3) 25 MCG (1000 UT) tablet Take 1,000 Units by mouth daily.    Marland Kitchen COUMADIN 5 MG tablet Take 2 tablets daily except 1 tablet on Saturdays 180 tablet 3  . diphenhydramine-acetaminophen (TYLENOL PM) 25-500 MG TABS tablet Take 1 tablet by mouth at bedtime as needed (sleep).    . docusate sodium (COLACE) 100 MG capsule Take 300 mg by mouth at bedtime as needed for mild constipation.     . fluticasone (FLONASE) 50 MCG/ACT nasal spray SHAKE LIQUID AND USE 2 SPRAYS IN EACH NOSTRIL DAILY (Patient taking differently: Place 2 sprays into both nostrils daily as needed for allergies. ) 48 g 1  . folic acid (FOLVITE) 1 MG tablet Take 1 tablet (1 mg total) by mouth daily. (Patient taking differently: Take 1 mg by mouth at bedtime. ) 100 tablet 5  . furosemide (LASIX) 40 MG tablet Take 1 tablet (40 mg total) by mouth daily. Skip dose today and start from 1/4 (Patient taking differently: Take 40 mg by mouth daily. ) 90 tablet 1  . gabapentin (NEURONTIN) 300 MG capsule TAKE 1 CAPSULE BY MOUTH AT BEDTIME (Patient taking differently: Take  300 mg by mouth at bedtime. ) 90 capsule 2  .  hydrALAZINE (APRESOLINE) 25 MG tablet TAKE 1 TABLET THREE TIMES A DAY (Patient taking differently: Take 25 mg by mouth 2 (two) times a day. ) 270 tablet 0  . HYDROcodone-acetaminophen (NORCO) 7.5-325 MG tablet Take 1 tablet by mouth every 6 (six) hours as needed for moderate pain (Must last 30 days.). 60 tablet 0  . isosorbide mononitrate (IMDUR) 60 MG 24 hr tablet Take 1 tablet (60 mg total) by mouth daily. (Patient taking differently: Take 60 mg by mouth at bedtime. ) 90 tablet 2  . Liniments (SALONPAS PAIN RELIEF PATCH EX) Apply 1 patch topically daily as needed (pain).    . meclizine (ANTIVERT) 12.5 MG tablet Take 1 tablet (12.5 mg total) by mouth 3 (three) times daily as needed for dizziness. 30 tablet 2  . metolazone (ZAROXOLYN) 2.5 MG tablet Take 1 tablet (2.5 mg total) by mouth 2 (two) times a week. Wednesdays and Saturdays  Skip dose on 1/4 and start from 1/8 (Patient taking differently: Take 2.5 mg by mouth 2 (two) times a week. Wednesdays and Saturdays) 30 tablet 3  . metoprolol succinate (TOPROL-XL) 100 MG 24 hr tablet TAKE 1 TABLET BY MOUTH DAILY. TAKE WITH OR IMMEDIATELY FOLLOWING A MEAL. (Patient taking differently: Take 100 mg by mouth at bedtime. ) 90 tablet 2  . Multiple Vitamin (MULTIVITAMIN) LIQD Take 5 mLs by mouth daily.    . ondansetron (ZOFRAN) 4 MG tablet Take 1 tablet (4 mg total) by mouth daily as needed for nausea or vomiting. 90 tablet 1  . Polyethyl [name] (SYSTANE) 0.4-0.3 % GEL ophthalmic gel Place 1 application into the left eye at bedtime.    . potassium chloride SA (K-DUR) 20 MEQ tablet TAKE 1 TABLET BY MOUTH DAILY (Patient taking differently: Take 20 mEq by mouth daily. ) 90 tablet 1  . pravastatin (PRAVACHOL) 40 MG tablet TAKE 1 TABLET EVERY EVENING 90 tablet 1  . SYNTHROID 75 MCG tablet TAKE 1 TABLET DAILY (Patient taking differently: Take 75 mcg by mouth daily before breakfast. ) 90 tablet 1  . benzonatate (TESSALON) 100 MG capsule Take 1 capsule  (100 mg total) by mouth 2 (two) times daily as needed for cough. (Patient not taking: Reported on 03/17/2019) 20 capsule 0   Current Facility-Administered Medications on File Prior to Visit  Medication Dose Route Frequency Provider Last Rate Last Dose  . epoetin alfa (EPOGEN,PROCRIT) injection 40,000 Units  40,000 Units Subcutaneous Once Farrel Gobble, MD       Allergies  Allergen Reactions  . Penicillins Other (See Comments)    Bruise Did it involve swelling of the face/tongue/throat, SOB, or low BP? No Did it involve sudden or severe rash/hives, skin peeling, or any reaction on the inside of your mouth or nose? No Did you need to seek medical attention at a hospital or doctor's office? No When did it last happen?20+ years If all above answers are "NO", may proceed with cephalosporin use.   . Aspirin Other (See Comments)    On coumadin  . Fentanyl Dermatitis    Rash with patch   . Niacin Itching    ROS: See HPI for pertinent positives and negatives.  Physical Examination  Vitals:   03/17/19 0928  BP: (!) 145/61  Pulse: 60  Resp: 16  Temp: (!) 97.1 F (36.2 C)  TempSrc: Oral  SpO2: 94%  Weight: 190 lb (86.2 kg)  Height: 5\' 7"  (1.702 m)  Body mass index is 29.76 kg/m.  General: A&O x 3, WD, elderly female seated in w/c, carrying her quad cane. HEENT: Grossly intact and WNL except for bilateral exophthalmos.  Pulmonary: Sym exp, respirations are non labored, fair air movement in all fields, no rales, rhonchi, or wheezing. Cardiac: Regular rhythm and rate, prostheticvalve click heard.Pacemaker palpated left upper chest.   Carotid Bruits Right Left   Negative Negative   Adominal aortic pulse is not palpable Radial pulses are 2+ palpable bilaterally.                           VASCULAR EXAM:                                                                                                         LE Pulses Right Left       POPLITEAL  not palpable   not  palpable       POSTERIOR TIBIAL  not palpable   not palpable        DORSALIS PEDIS      ANTERIOR TIBIAL 2+ palpable  2+ palpable     Gastrointestinal: soft, NTND, -G/R, - HSM, - masses palpated, - CVAT B. Musculoskeletal: M/S 5/5 in upper extremities, 4/5 in lower extremities, Extremities without ischemic changes. Skin: No rashes, no ulcers, no cellulitis.   Neurologic: CN 2-12 intact, Pain and light touch intact in extremities are intact, Motor exam as listed above. Psychiatric: Normal thought content, mood appropriate to clinical situation.   DATA  AAA Duplex (03/17/2019):  Previous size: 4.15 cm (Date: 02-26-18); Right CIA: not visualized; Left CIA: not visualized  Limited exam due to overlying bowel gas and obesity.   AAA Duplex (03-17-19): Abdominal Aorta Findings: +--------+-------+----------+----------+--------+--------+--------+ LocationAP (cm)Trans (cm)PSV (cm/s)WaveformThrombusComments +--------+-------+----------+----------+--------+--------+--------+ Proximal2.61   2.52      39                                 +--------+-------+----------+----------+--------+--------+--------+ Mid     1.70   1.60      82                Present          +--------+-------+----------+----------+--------+--------+--------+ Distal  2.67                                                +--------+-------+----------+----------+--------+--------+--------+  Visualization of the Mid Abdominal Aorta, Distal Abdominal Aorta, Right CIA Proximal artery and Left CIA Proximal artery was limited.   Summary: Abdominal Aorta: The largest aortic measurement is 2.7 cm; however, this is based on limited visualization due to overlying bowel gas. Previous diameter measurement was 4.2 cm obtained on 02/26/2018.    CTA chest on 11-16-16 to evaluate dyspnea, CHF: Adequate contrast opacification of the thoracic aorta with no evidence of dissection, aneurysm, or stenosis. There  is  dilated ascending aorta up to 4.2 cm diameter. Classic 3 vessel Brachiocephalic arch anatomy without proximal stenosis   Non contrast CT chest (07-30-18) to evaluate dyspnea, productive cough requested by Dr. Dayna Barker: Cardiovascular: There is extensive atherosclerosis of the aorta, great vessels and coronary arteries status post median sternotomy, aortic and mitral valve replacement. There is central enlargement of the pulmonary arteries consistent with pulmonary arterial hypertension. No acute vascular findings are demonstrated. Left subclavian AICD leads appear unchanged. There is stable moderate cardiomegaly and trace pericardial fluid. IMPRESSION: 1. Diffuse reticulonodular interstitial prominence, greatest in the right upper lobe and mildly progressive from 11/16/2016. This favors chronic interstitial lung disease superimposed on emphysema. Consider pneumoconioses and hypersensitivity pneumonitis. No confluent airspace opacity or definite edema. 2. Stable cardiomegaly post open heart surgery. Central enlargement of the pulmonary arteries consistent with pulmonary arterial hypertension. 3.  Aortic Atherosclerosis (ICD10-I70.0). 4. Stable enlargement of the spleen by multiple peripherally calcified low-density masses.    Medical Decision Making  The patient is a 85 y.o. female who presents with asymptomatic AAA with no increase in size, based on limited visualization.  Her serum creatinine was 1.5 on 02-13-19.     Based on this patient's exam and diagnostic studies, the patient will follow up in 9 months  with the following studies: AAA duplex.  Consideration for repair of AAA would be made when the size is 5.5cm, growth > 1 cm/yr, and symptomatic status.  Abdominal aortic aneurysm less than 5-1/2 cm in diameter has less then 1/2% risk of rupture per year.        The patient was given information about AAA including signs, symptoms, treatment, and how to minimize the risk  of enlargement and rupture of aneurysms.    I emphasized the importance of maximal medical management including strict control of blood pressure, blood glucose, and lipid levels, antiplatelet agents, obtaining regular exercise, and continued  cessation of smoking.   The patient was advised to call 911 should the patient experience sudden onset abdominal or back pain.   Thank you for allowing [place] to participate in this patient's care.  Clemon Chambers, RN, MSN, FNP-C [facility] Office: [facility] Physician: Trula Slade  03/17/2019, 9:41 AM

## 2019-03-18 ENCOUNTER — Telehealth: Payer: Self-pay | Admitting: *Deleted

## 2019-03-18 ENCOUNTER — Ambulatory Visit (HOSPITAL_COMMUNITY)
Admission: RE | Admit: 2019-03-18 | Discharge: 2019-03-18 | Disposition: A | Discharge status: Home / Self Care | Payer: Medicare Other | Source: Ambulatory Visit | Attending: Internal Medicine | Admitting: Internal Medicine

## 2019-03-18 DIAGNOSIS — I255 Ischemic cardiomyopathy: Secondary | ICD-10-CM | POA: Diagnosis not present

## 2019-03-18 NOTE — Telephone Encounter (Signed)
Patient calling back to inquire about her Coumadin.  Informed patient that message has been sent to Lattie Haw already, she will address tomorrow.  Patient stated that pharmacy only gave her # 10 tabs & was told no longer making the brand name medication.

## 2019-03-18 NOTE — Progress Notes (Signed)
*  PRELIMINARY RESULTS* Echocardiogram 2D Echocardiogram has been performed.  Samuel Germany 03/18/2019, 1:46 PM

## 2019-03-19 NOTE — Telephone Encounter (Signed)
Spoke with pt and pharmacist at [facility].  Brand name coumadin by [facility] is no longer being manufactured.  Pt does not do well on Warfarin.  Will change Rx to [facility].  Pt in agreement and new Rx was sent to [facility]

## 2019-03-20 ENCOUNTER — Other Ambulatory Visit: Payer: Self-pay

## 2019-03-20 ENCOUNTER — Emergency Department (HOSPITAL_COMMUNITY): Payer: Medicare Other

## 2019-03-20 ENCOUNTER — Encounter (HOSPITAL_COMMUNITY): Payer: Self-pay

## 2019-03-20 ENCOUNTER — Telehealth: Payer: Self-pay | Admitting: *Deleted

## 2019-03-20 ENCOUNTER — Emergency Department (HOSPITAL_COMMUNITY)
Admission: EM | Admit: 2019-03-20 | Discharge: 2019-03-21 | Disposition: A | Discharge status: Home / Self Care | Payer: Medicare Other | Attending: Emergency Medicine | Admitting: Emergency Medicine

## 2019-03-20 DIAGNOSIS — R0602 Shortness of breath: Secondary | ICD-10-CM | POA: Diagnosis not present

## 2019-03-20 DIAGNOSIS — Z952 Presence of prosthetic heart valve: Secondary | ICD-10-CM | POA: Diagnosis not present

## 2019-03-20 DIAGNOSIS — R0600 Dyspnea, unspecified: Secondary | ICD-10-CM | POA: Diagnosis not present

## 2019-03-20 DIAGNOSIS — I130 Hypertensive heart and chronic kidney disease with heart failure and stage 1 through stage 4 chronic kidney disease, or unspecified chronic kidney disease: Secondary | ICD-10-CM | POA: Diagnosis not present

## 2019-03-20 DIAGNOSIS — M25512 Pain in left shoulder: Secondary | ICD-10-CM | POA: Diagnosis not present

## 2019-03-20 DIAGNOSIS — J449 Chronic obstructive pulmonary disease, unspecified: Secondary | ICD-10-CM | POA: Diagnosis not present

## 2019-03-20 DIAGNOSIS — Z7901 Long term (current) use of anticoagulants: Secondary | ICD-10-CM | POA: Diagnosis not present

## 2019-03-20 DIAGNOSIS — R079 Chest pain, unspecified: Secondary | ICD-10-CM | POA: Diagnosis present

## 2019-03-20 DIAGNOSIS — E039 Hypothyroidism, unspecified: Secondary | ICD-10-CM | POA: Insufficient documentation

## 2019-03-20 DIAGNOSIS — Z87891 Personal history of nicotine dependence: Secondary | ICD-10-CM | POA: Diagnosis not present

## 2019-03-20 DIAGNOSIS — I5032 Chronic diastolic (congestive) heart failure: Secondary | ICD-10-CM | POA: Insufficient documentation

## 2019-03-20 DIAGNOSIS — N184 Chronic kidney disease, stage 4 (severe): Secondary | ICD-10-CM | POA: Diagnosis not present

## 2019-03-20 DIAGNOSIS — Z85850 Personal history of malignant neoplasm of thyroid: Secondary | ICD-10-CM | POA: Diagnosis not present

## 2019-03-20 DIAGNOSIS — Z950 Presence of cardiac pacemaker: Secondary | ICD-10-CM | POA: Insufficient documentation

## 2019-03-20 DIAGNOSIS — Z9981 Dependence on supplemental oxygen: Secondary | ICD-10-CM | POA: Diagnosis not present

## 2019-03-20 DIAGNOSIS — E1122 Type 2 diabetes mellitus with diabetic chronic kidney disease: Secondary | ICD-10-CM | POA: Insufficient documentation

## 2019-03-20 DIAGNOSIS — Z79899 Other long term (current) drug therapy: Secondary | ICD-10-CM | POA: Insufficient documentation

## 2019-03-20 LAB — COMPREHENSIVE METABOLIC PANEL
ALT: 13 U/L (ref 0–44)
AST: 21 U/L (ref 15–41)
Albumin: 3.7 g/dL (ref 3.5–5.0)
Alkaline Phosphatase: 58 U/L (ref 38–126)
Anion gap: 11 (ref 5–15)
BUN: 45 mg/dL — ABNORMAL HIGH (ref 8–23)
CO2: 30 mmol/L (ref 22–32)
Calcium: 9.3 mg/dL (ref 8.9–10.3)
Chloride: 89 mmol/L — ABNORMAL LOW (ref 98–111)
Creatinine, Ser: 1.62 mg/dL — ABNORMAL HIGH (ref 0.44–1.00)
GFR calc Af Amer: 33 mL/min — ABNORMAL LOW (ref >60)
GFR calc non Af Amer: 29 mL/min — ABNORMAL LOW (ref >60)
Glucose, Bld: 91 mg/dL (ref 70–99)
Potassium: 4.5 mmol/L (ref 3.5–5.1)
Sodium: 130 mmol/L — ABNORMAL LOW (ref 135–145)
Total Bilirubin: 0.5 mg/dL (ref 0.3–1.2)
Total Protein: 7.3 g/dL (ref 6.5–8.1)

## 2019-03-20 LAB — CBC WITH DIFFERENTIAL/PLATELET
Abs Immature Granulocytes: 0.03 10*3/uL (ref 0.00–0.07)
Basophils Absolute: 0 10*3/uL (ref 0.0–0.1)
Basophils Relative: 1 %
Eosinophils Absolute: 0 10*3/uL (ref 0.0–0.5)
Eosinophils Relative: 0 %
HCT: 29.6 % — ABNORMAL LOW (ref 36.0–46.0)
Hemoglobin: 9.3 g/dL — ABNORMAL LOW (ref 12.0–15.0)
Immature Granulocytes: 1 %
Lymphocytes Relative: 13 %
Lymphs Abs: 0.8 10*3/uL (ref 0.7–4.0)
MCH: 29.7 pg (ref 26.0–34.0)
MCHC: 31.4 g/dL (ref 30.0–36.0)
MCV: 94.6 fL (ref 80.0–100.0)
Monocytes Absolute: 0.7 10*3/uL (ref 0.1–1.0)
Monocytes Relative: 11 %
Neutro Abs: 4.5 10*3/uL (ref 1.7–7.7)
Neutrophils Relative %: 74 %
Platelets: 207 10*3/uL (ref 150–400)
RBC: 3.13 MIL/uL — ABNORMAL LOW (ref 3.87–5.11)
RDW: 16.3 % — ABNORMAL HIGH (ref 11.5–15.5)
WBC: 6 10*3/uL (ref 4.0–10.5)
nRBC: 0 % (ref 0.0–0.2)

## 2019-03-20 LAB — BRAIN NATRIURETIC PEPTIDE: B Natriuretic Peptide: 497 pg/mL — ABNORMAL HIGH (ref 0.0–100.0)

## 2019-03-20 LAB — TROPONIN I (HIGH SENSITIVITY)
Troponin I (High Sensitivity): 22 ng/L — ABNORMAL HIGH (ref <18)
Troponin I (High Sensitivity): 24 ng/L — ABNORMAL HIGH (ref <18)

## 2019-03-20 LAB — D-DIMER, QUANTITATIVE: D-Dimer, Quant: 0.77 ug/mL-FEU — ABNORMAL HIGH (ref 0.00–0.50)

## 2019-03-20 MED ORDER — FUROSEMIDE 10 MG/ML IJ SOLN
40.0000 mg | Freq: Once | INTRAMUSCULAR | Status: AC
  Administered 2019-03-20: 40 mg via INTRAVENOUS
  Filled 2019-03-20: qty 4

## 2019-03-20 NOTE — ED Provider Notes (Addendum)
[facility] EMERGENCY DEPARTMENT Provider Note   CSN: [id number] Arrival date & time: 03/20/19  1710    History   Chief Complaint No chief complaint on file.   HPI [name] [name] is a 85 y.o. female.     Increasing dyspnea over the past several days without frank substernal chest pain.  Recent pacemaker insertion.  Review of systems positive for posterior left shoulder pain.  She took a Tums which helped a small amount.  She is on 2 L of nasal oxygen at home.  She is able to walk but only a short distance.  Severity is moderate.  Exertion makes symptoms worse.     Past Medical History:  Diagnosis Date  . Adenomatous polyp of colon 12/16/2002   Dr. [name]/[facility]  . Allergy   . Cataract   . CHB (complete heart block) ([place]) 10/07/2014      . CHF (congestive heart failure) ([place])    a.  EF previously reduced to 25-30% b. EF improved to 60-65% by echo in 10/2017.   Marland Kitchen Chronic kidney disease, stage I 2006  . Constipation       . COPD (chronic obstructive pulmonary disease) ([address])       . DJD (degenerative joint disease) of lumbar spine   . DM (diabetes mellitus) ([place]) 2006  . Esophageal reflux   . Glaucoma   . Gout       . H/O heart valve replacement with mechanical valve    a. s/p mechanical AVR in 2001 and mechanical MVR in 2004.  Marland Kitchen H/O wisdom tooth extraction   . Hyperlipemia   . IDA (iron deficiency anemia)    Parenteral iron/[name]  . Insomnia       . Non-ischemic cardiomyopathy ([place])    a. s/p MDT CRTD  . Obesity, unspecified   . Other and unspecified hyperlipidemia   . Oxygen deficiency 2014   nocturnal;  . Spondylolisthesis   . Spondylosis   . Thyroid cancer ([place])    remote thyroidectomy, no recurrence, pt denies in 2016 thyroid cancer  . Unspecified hypothyroidism       . 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 Vertigo         Patient Active Problem List   Diagnosis Date Noted  .  Encounter for examination following treatment at hospital 11/03/2018  . Hyponatremia 09/27/2018  . H/O mitral valve replacement with mechanical valve 08/02/2018  . Chronic right hip pain 04/16/2018  . Trochanteric bursitis, right hip 03/07/2017  . Persistent atrial fibrillation   . Nocturnal hypoxia 10/08/2016  . Dependence on nocturnal oxygen therapy 10/08/2016  . S/P ICD (internal cardiac defibrillator) procedure 02/08/2015  . CHB (complete heart block) ([place]) 10/07/2014  . Fatigue 07/20/2014  . Absolute anemia 06/24/2014  . Osteopenia 02/10/2014  . Encounter for therapeutic drug monitoring 10/29/2013  . Spinal stenosis of lumbar region 02/19/2013  . OA (osteoarthritis) of knee 02/19/2013  . Chronic pain of right knee 01/02/2013  . Hemolytic anemia ([place]) 09/24/2012  . H/O adenomatous polyp of colon 05/24/2012  . High risk medication use 05/24/2012  . COPD (chronic obstructive pulmonary disease) ([place]) 05/19/2012  . Chronic diastolic heart failure ([place]) 05/19/2012  . Chronic anticoagulation, on coumadin for Mechanical AVR and MVR 04/01/2012  . Cardiorenal syndrome with renal failure 03/22/2012  . Hx of aortic valve replacement, mechanical 03/22/2012  . S/P MVR (mitral valve replacement) 03/22/2012  . Biventricular automatic implantable cardioverter defibrillator  in situ 03/22/2012  . CKD (chronic kidney disease), stage IV ([place]) 03/22/2012  . Warfarin-induced coagulopathy ([place]) 03/21/2012  . Papilloma of breast 03/06/2012  . Iron deficiency 10/04/2011  . Allergic rhinitis 02/14/2011  . Nausea without vomiting 06/07/2010  . HIP PAIN, RIGHT 04/23/2009  . VARICOSE VEINS LOWER EXTREMITIES W/OTH COMPS 02/09/2009  . Calculus of gallbladder with chronic cholecystitis without obstruction 02/09/2009  . Prediabetes 06/15/2008  . Hypothyroidism, postsurgical 02/24/2008  . Hyperlipidemia LDL goal <70 02/24/2008  . Mild obesity 02/24/2008  . Essential hypertension 02/24/2008  . Non-ischemic  cardiomyopathy with ICD 02/24/2008  . GERD 02/24/2008  . DEGENERATIVE JOINT DISEASE, SPINE 02/24/2008    Past Surgical History:  Procedure Laterality Date  . AORTIC AND MITRAL VALVE REPLACEMENT  2001  . BI-VENTRICULAR IMPLANTABLE CARDIOVERTER DEFIBRILLATOR UPGRADE N/A 01/18/2015   Procedure: BI-VENTRICULAR IMPLANTABLE CARDIOVERTER DEFIBRILLATOR UPGRADE;  Surgeon: Evans Lance, MD;  Location: [facility] CATH LAB;  Service: Cardiovascular;  Laterality: N/A;  . BIV ICD GENERATOR CHANGEOUT N/A 01/30/2019   Procedure: BIV ICD GENERATOR CHANGEOUT;  Surgeon: Evans Lance, MD;  Location: [facility] CV LAB;  Service: Cardiovascular;  Laterality: N/A;  . CARDIAC CATHETERIZATION    . CARDIAC VALVE REPLACEMENT    . CARDIOVERSION N/A 11/13/2016   Procedure: CARDIOVERSION;  Surgeon: Sanda Klein, MD;  Location: [facility] ENDOSCOPY;  Service: Cardiovascular;  Laterality: N/A;  . COLONOSCOPY  06/12/2007   Rourk- Normal rectum/Normal colon  . COLONOSCOPY  12/16/02   small hemorrhoids  . COLONOSCOPY  06/20/2012   Procedure: COLONOSCOPY;  Surgeon: Daneil Dolin, MD;  Location: [facility] ENDO SUITE;  Service: Endoscopy;  Laterality: N/A;  10:45  . defibrillator implanted 2006    . DOPPLER ECHOCARDIOGRAPHY  2012  . DOPPLER ECHOCARDIOGRAPHY  05,06,07,08,09,2011  . ESOPHAGOGASTRODUODENOSCOPY  06/12/2007   Rourk- normal esophagus, small hiatal hernia, otherwise normal stomach, D1, D2  . EYE SURGERY Right 2005  . EYE SURGERY Left 2006  . ICD LEAD REMOVAL Left 02/08/2015   Procedure: ICD LEAD REMOVAL;  Surgeon: Evans Lance, MD;  Location: [facility] OR;  Service: Cardiovascular;  Laterality: Left;  "Will plan extraction and insertion of a BiV PM"  **Dr. Roxan Hockey backing up case**  . IMPLANTABLE CARDIOVERTER DEFIBRILLATOR (ICD) GENERATOR CHANGE Left 02/08/2015   Procedure: ICD GENERATOR CHANGE;  Surgeon: Evans Lance, MD;  Location: [place];  Service: Cardiovascular;  Laterality: Left;  . INSERT / REPLACE / REMOVE PACEMAKER    .  PACEMAKER INSERTION  May 2016  . pacemaker placed  2004  . right breast cyst removed     benign   . right cataract removed     2005  . TEE WITHOUT CARDIOVERSION N/A 11/13/2016   Procedure: TRANSESOPHAGEAL ECHOCARDIOGRAM (TEE);  Surgeon: Sanda Klein, MD;  Location: [facility] ENDOSCOPY;  Service: Cardiovascular;  Laterality: N/A;  . THYROIDECTOMY       OB History    Gravida      Para      Term      Preterm      AB      Living  0     SAB      TAB      Ectopic      Multiple      Live Births               Home Medications    Prior to Admission medications   Medication Sig Start Date End Date Taking? Authorizing Provider  acetaminophen (TYLENOL) 500 MG tablet  Take 500 mg by mouth every 6 (six) hours as needed (pain).    [provider]  albuterol (PROVENTIL HFA;VENTOLIN HFA) 108 (90 Base) MCG/ACT inhaler INHALE 2 PUFFS INTO THE LUNGS EVERY 6 HOURS AS NEEDED FOR WHEEZING OR SHORTNESS OF BREATH Patient taking differently: Inhale 2 puffs into the lungs every 6 (six) hours as needed for wheezing or shortness of breath.  09/30/18   Fayrene Helper, MD  albuterol (PROVENTIL) (2.5 MG/3ML) 0.083% nebulizer solution INHALE 1 VIAL VIA NEBULIZER THREE TIMES DAILY. Patient taking differently: Take 2.5 mg by nebulization 2 (two) times daily.  12/28/16   Fayrene Helper, MD  allopurinol (ZYLOPRIM) 100 MG tablet TAKE 1 TABLET(100 MG) BY MOUTH DAILY Patient taking differently: Take 100 mg by mouth daily.  01/24/19   Fayrene Helper, MD  benzonatate (TESSALON) 100 MG capsule Take 1 capsule (100 mg total) by mouth 2 (two) times daily as needed for cough. Patient not taking: Reported on 03/17/2019 02/13/19   Fayrene Helper, MD  BREO ELLIPTA 100-25 MCG/INH AEPB TAKE 1 INHALATION BY MOUTH ONCE DAILY 02/25/19   [provider]  cholecalciferol (VITAMIN D3) 25 MCG (1000 UT) tablet Take 1,000 Units by mouth daily.    [provider]   diphenhydramine-acetaminophen (TYLENOL PM) 25-500 MG TABS tablet Take 1 tablet by mouth at bedtime as needed (sleep).    [provider]  docusate sodium (COLACE) 100 MG capsule Take 300 mg by mouth at bedtime as needed for mild constipation.     [provider]  fluticasone (FLONASE) 50 MCG/ACT nasal spray SHAKE LIQUID AND USE 2 SPRAYS IN EACH NOSTRIL DAILY Patient taking differently: Place 2 sprays into both nostrils daily as needed for allergies.  12/23/18   Fayrene Helper, MD  folic acid (FOLVITE) 1 MG tablet Take 1 tablet (1 mg total) by mouth daily. Patient taking differently: Take 1 mg by mouth at bedtime.  09/30/18   Fayrene Helper, MD  furosemide (LASIX) 40 MG tablet Take 1 tablet (40 mg total) by mouth daily. Skip dose today and start from 1/4 Patient taking differently: Take 40 mg by mouth daily.  09/28/18   Dhungel, Nishant, MD  gabapentin (NEURONTIN) 300 MG capsule TAKE 1 CAPSULE BY MOUTH AT BEDTIME Patient taking differently: Take 300 mg by mouth at bedtime.  08/06/18   Fayrene Helper, MD  hydrALAZINE (APRESOLINE) 25 MG tablet TAKE 1 TABLET THREE TIMES A DAY Patient taking differently: Take 25 mg by mouth 2 (two) times a day.  09/23/18   Fayrene Helper, MD  HYDROcodone-acetaminophen (NORCO) 7.5-325 MG tablet Take 1 tablet by mouth every 6 (six) hours as needed for moderate pain (Must last 30 days.). 01/01/19   Sanjuana Kava, MD  isosorbide mononitrate (IMDUR) 60 MG 24 hr tablet Take 1 tablet (60 mg total) by mouth daily. Patient taking differently: Take 60 mg by mouth at bedtime.  11/11/18   Croitoru, Mihai, MD  Liniments (SALONPAS PAIN RELIEF PATCH EX) Apply 1 patch topically daily as needed (pain).    [provider]  meclizine (ANTIVERT) 12.5 MG tablet Take 1 tablet (12.5 mg total) by mouth 3 (three) times daily as needed for dizziness. 12/10/18   Fayrene Helper, MD  metolazone (ZAROXOLYN) 2.5 MG tablet Take 1 tablet (2.5 mg total) by  mouth 2 (two) times a week. Wednesdays and Saturdays  Skip dose on 1/4 and start from 1/8 Patient taking differently: Take 2.5 mg by mouth 2 (two) times  a week. Wednesdays and Saturdays 10/02/18   Dhungel, Flonnie Overman, MD  metoprolol succinate (TOPROL-XL) 100 MG 24 hr tablet TAKE 1 TABLET BY MOUTH DAILY. TAKE WITH OR IMMEDIATELY FOLLOWING A MEAL. Patient taking differently: Take 100 mg by mouth at bedtime.  11/04/18   Fayrene Helper, MD  Multiple Vitamin (MULTIVITAMIN) LIQD Take 5 mLs by mouth daily.    [provider]  ondansetron (ZOFRAN) 4 MG tablet Take 1 tablet (4 mg total) by mouth daily as needed for nausea or vomiting. 10/09/18   Fayrene Helper, MD  Polyethyl [name] (SYSTANE) 0.4-0.3 % GEL ophthalmic gel Place 1 application into the left eye at bedtime.    [provider]  potassium chloride SA (K-DUR) 20 MEQ tablet TAKE 1 TABLET BY MOUTH DAILY Patient taking differently: Take 20 mEq by mouth daily.  01/24/19   Fayrene Helper, MD  pravastatin (PRAVACHOL) 40 MG tablet TAKE 1 TABLET EVERY EVENING 02/20/19   Fayrene Helper, MD  SYNTHROID 75 MCG tablet TAKE 1 TABLET DAILY Patient taking differently: Take 75 mcg by mouth daily before breakfast.  08/05/18   Fayrene Helper, MD  warfarin (JANTOVEN) 5 MG tablet Take 5 mg by mouth as directed. Take 2 tablets daily except 1 tablet on Saturdays    [provider]    Family History Family History  Problem Relation Age of Onset  . Pancreatic cancer Mother   . Heart disease Father   . Heart disease Sister   . Heart attack Sister   . Heart disease Brother   . Diabetes Brother   . Heart disease Brother   . Diabetes Brother   . Hypertension Brother   . Lung cancer Brother     Social History Social History   Tobacco Use  . Smoking status: Former Smoker    Packs/day: 0.75    Years: 40.00    Pack years: 30.00    Types: Cigarettes    Quit date: 03/08/1987    Years since quitting: 32.0   . Smokeless tobacco: Never Used  Substance Use Topics  . Alcohol use: No    Alcohol/week: 0.0 standard drinks  . Drug use: No     Allergies   Penicillins, Aspirin, Fentanyl, and Niacin   Review of Systems Review of Systems  All other systems reviewed and are negative.    Physical Exam Updated Vital Signs BP (!) 140/54   Pulse (!) 59   Temp 99 F (37.2 C) (Oral)   Resp (!) 29   Ht 5\' 7"  (1.702 m)   Wt 89.8 kg   SpO2 100%   BMI 31.01 kg/m   Physical Exam Vitals signs and nursing note reviewed.  Constitutional:      Appearance: She is well-developed.     Comments: nad  HENT:     Head: Normocephalic and atraumatic.  Eyes:     Conjunctiva/sclera: Conjunctivae normal.  Neck:     Musculoskeletal: Neck supple.  Cardiovascular:     Rate and Rhythm: Normal rate and regular rhythm.  Pulmonary:     Effort: Pulmonary effort is normal.     Breath sounds: Normal breath sounds.  Abdominal:     General: Bowel sounds are normal.     Palpations: Abdomen is soft.  Musculoskeletal: Normal range of motion.  Skin:    General: Skin is warm and dry.     Comments: 2+ peripheral edema.  Neurological:     Mental Status: She is alert and oriented to  person, place, and time.  Psychiatric:        Behavior: Behavior normal.      ED Treatments / Results  Labs (all labs ordered are listed, but only abnormal results are displayed) Labs Reviewed  CBC WITH DIFFERENTIAL/PLATELET - Abnormal; Notable for the following components:      Result Value   RBC 3.13 (*)    Hemoglobin 9.3 (*)    HCT 29.6 (*)    RDW 16.3 (*)    All other components within normal limits  COMPREHENSIVE METABOLIC PANEL - Abnormal; Notable for the following components:   Sodium 130 (*)    Chloride 89 (*)    BUN 45 (*)    Creatinine, Ser 1.62 (*)    GFR calc non Af Amer 29 (*)    GFR calc Af Amer 33 (*)    All other components within normal limits  TROPONIN I (HIGH SENSITIVITY) - Abnormal; Notable for the  following components:   Troponin I (High Sensitivity) 22.00 (*)    All other components within normal limits  TROPONIN I (HIGH SENSITIVITY) - Abnormal; Notable for the following components:   Troponin I (High Sensitivity) 24.00 (*)    All other components within normal limits  BRAIN NATRIURETIC PEPTIDE - Abnormal; Notable for the following components:   B Natriuretic Peptide 497.0 (*)    All other components within normal limits  D-DIMER, QUANTITATIVE (NOT AT [facility]) - Abnormal; Notable for the following components:   D-Dimer, Quant 0.77 (*)    All other components within normal limits    EKG None    Date: 03/20/2019  Rate: 60  Rhythm: paced rhythm  QRS Axis: normal  Intervals: normal  ST/T Wave abnormalities: normal  Conduction Disutrbances: none  Narrative Interpretation: unremarkable    Radiology Dg Chest Portable 1 View  Result Date: 03/20/2019 CLINICAL DATA:  Shortness of breath and left shoulder blade/left arm pain. History of COPD. EXAM: PORTABLE CHEST 1 VIEW COMPARISON:  October 14, 2018 FINDINGS: The heart size is enlarged. There is a multi lead left-sided pacemaker. There is vascular congestion. The patient is status post prior median sternotomy. The lungs are hyperexpanded. There is no pneumothorax. There may be trace bilateral pleural effusions. There are advanced degenerative changes of the left glenohumeral joint. IMPRESSION: 1. Cardiomegaly with vascular congestion. 2. Hyperexpanded lungs which can be seen in patients with COPD. 3. Advanced degenerative changes of the left glenohumeral joint. Electronically Signed   By: Constance Holster M.D.   On: 03/20/2019 18:47    Procedures Procedures (including critical care time)  Medications Ordered in ED Medications  furosemide (LASIX) injection 40 mg (40 mg Intravenous Given 03/20/19 2118)     Initial Impression / Assessment and Plan / ED Course  I have reviewed the triage vital signs and the nursing notes.   Pertinent labs & imaging results that were available during my care of the patient were reviewed by me and considered in my medical decision making (see chart for details).        Patient presents with dyspnea.  She is hemodynamically stable.  Will initiate appropriate work-up.  Patient rechecked prior to discharge.  High-sensitivity troponin minimally elevated which is not surprising.  Age adjusted d-dimer acceptable.  No frank pulmonary edema on chest x-ray.  Will increase Lasix a small amount.  This was discussed with the patient.  She will follow-up with her primary care doctor. Final Clinical Impressions(s) / ED Diagnoses   Final diagnoses:  Dyspnea, unspecified  type    ED Discharge Orders    None       Nat Christen, MD 03/20/19 Scarlette Ar, MD 03/20/19 [phone]

## 2019-03-20 NOTE — Discharge Instructions (Addendum)
Test showed no life-threatening condition.  Recommend a small increase in your furosemide as we discussed.  Follow-up with your primary care doctor.

## 2019-03-20 NOTE — Telephone Encounter (Signed)
Spoke with patient. She states she has about a weeks worth of medication left. Advised her to go ahead and call in her refills and they should be able to get her the medication in time. If they can't then we can order her a weeks worth of the medication to hold her over until she can get them in the mail.

## 2019-03-20 NOTE — Telephone Encounter (Signed)
Pt called she is out of metoporolol and allopurinol and need this sent to mail order pharmacy. She is completely out and wanted the nurse to call hr back regarding what she needs to do in the mean time.

## 2019-03-20 NOTE — ED Notes (Signed)
ED Provider at bedside. 

## 2019-03-20 NOTE — ED Triage Notes (Signed)
Pt reports sob and pain under left shoulder blade and left arm that started today. Denies fever.  Pt says she took some tums and pain got better.

## 2019-03-24 ENCOUNTER — Other Ambulatory Visit: Payer: Self-pay | Admitting: *Deleted

## 2019-03-24 ENCOUNTER — Ambulatory Visit: Payer: Medicare Other | Admitting: Family Medicine

## 2019-03-24 MED ORDER — WARFARIN SODIUM 5 MG PO TABS: ORAL_TABLET | ORAL | 3 refills | Status: DC

## 2019-03-25 ENCOUNTER — Encounter: Payer: Self-pay | Admitting: Family Medicine

## 2019-03-25 ENCOUNTER — Ambulatory Visit (INDEPENDENT_AMBULATORY_CARE_PROVIDER_SITE_OTHER): Payer: Medicare Other | Admitting: Family Medicine

## 2019-03-25 ENCOUNTER — Other Ambulatory Visit: Payer: Self-pay

## 2019-03-25 VITALS — BP 138/62 | HR 78 | Resp 12 | Ht 67.0 in | Wt 197.0 lb

## 2019-03-25 DIAGNOSIS — E890 Postprocedural hypothyroidism: Secondary | ICD-10-CM | POA: Diagnosis not present

## 2019-03-25 DIAGNOSIS — Z09 Encounter for follow-up examination after completed treatment for conditions other than malignant neoplasm: Secondary | ICD-10-CM

## 2019-03-25 DIAGNOSIS — I255 Ischemic cardiomyopathy: Secondary | ICD-10-CM

## 2019-03-25 DIAGNOSIS — G8929 Other chronic pain: Secondary | ICD-10-CM | POA: Diagnosis not present

## 2019-03-25 DIAGNOSIS — M25551 Pain in right hip: Secondary | ICD-10-CM

## 2019-03-25 DIAGNOSIS — E785 Hyperlipidemia, unspecified: Secondary | ICD-10-CM

## 2019-03-25 DIAGNOSIS — I5032 Chronic diastolic (congestive) heart failure: Secondary | ICD-10-CM

## 2019-03-25 NOTE — Patient Instructions (Signed)
F/U as before, call if you need me sooner  Thankful you are improved   I will be in touch with your Cardiologist to  review your recent ED visit , you already have an August appointment  Thanks for choosing [facility], we consider it a privelige to serve you.

## 2019-03-31 ENCOUNTER — Ambulatory Visit (INDEPENDENT_AMBULATORY_CARE_PROVIDER_SITE_OTHER): Payer: Medicare Other

## 2019-03-31 DIAGNOSIS — I5032 Chronic diastolic (congestive) heart failure: Secondary | ICD-10-CM | POA: Diagnosis not present

## 2019-03-31 DIAGNOSIS — Z95810 Presence of automatic (implantable) cardiac defibrillator: Secondary | ICD-10-CM

## 2019-03-31 IMAGING — MG DIGITAL SCREENING BILATERAL MAMMOGRAM WITH TOMO AND CAD
6 of 10 series · 6 of 30 positions shown · non-contrast
Comparison: Previous exam(s).

CLINICAL DATA: Screening.

EXAM:
DIGITAL SCREENING BILATERAL MAMMOGRAM WITH TOMO AND CAD

[L MLO synth-2D (1 of 2)]
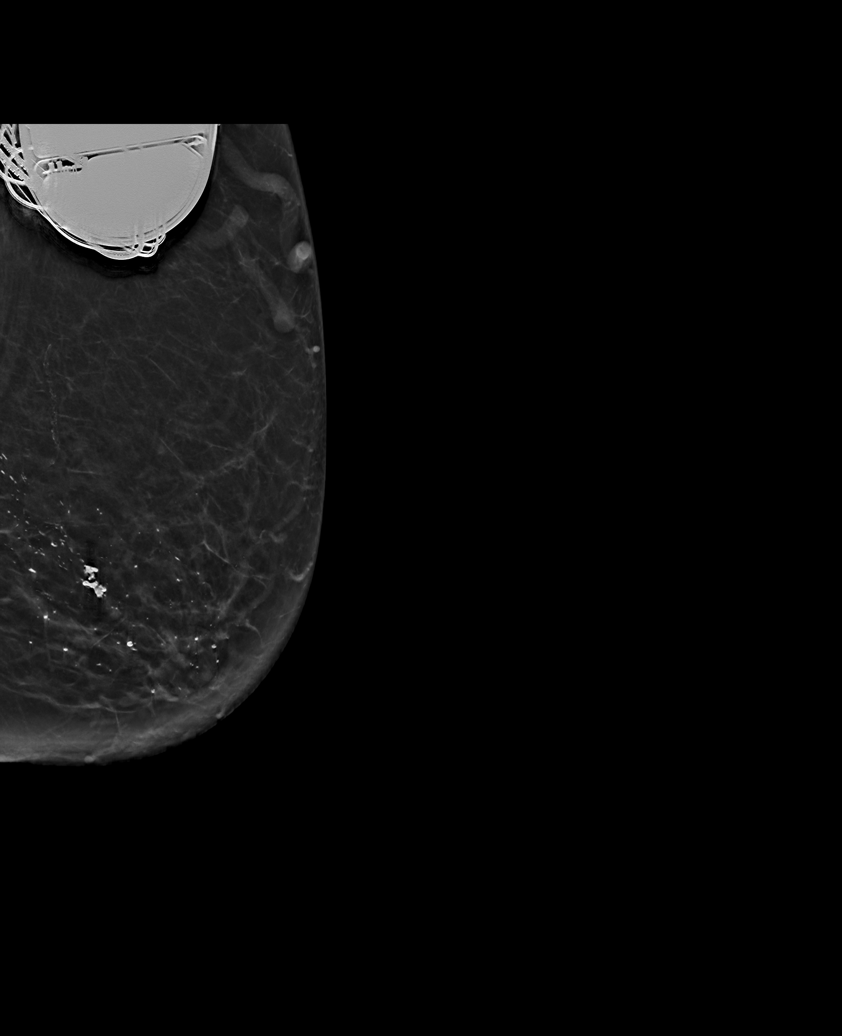

[R MLO synth-2D]
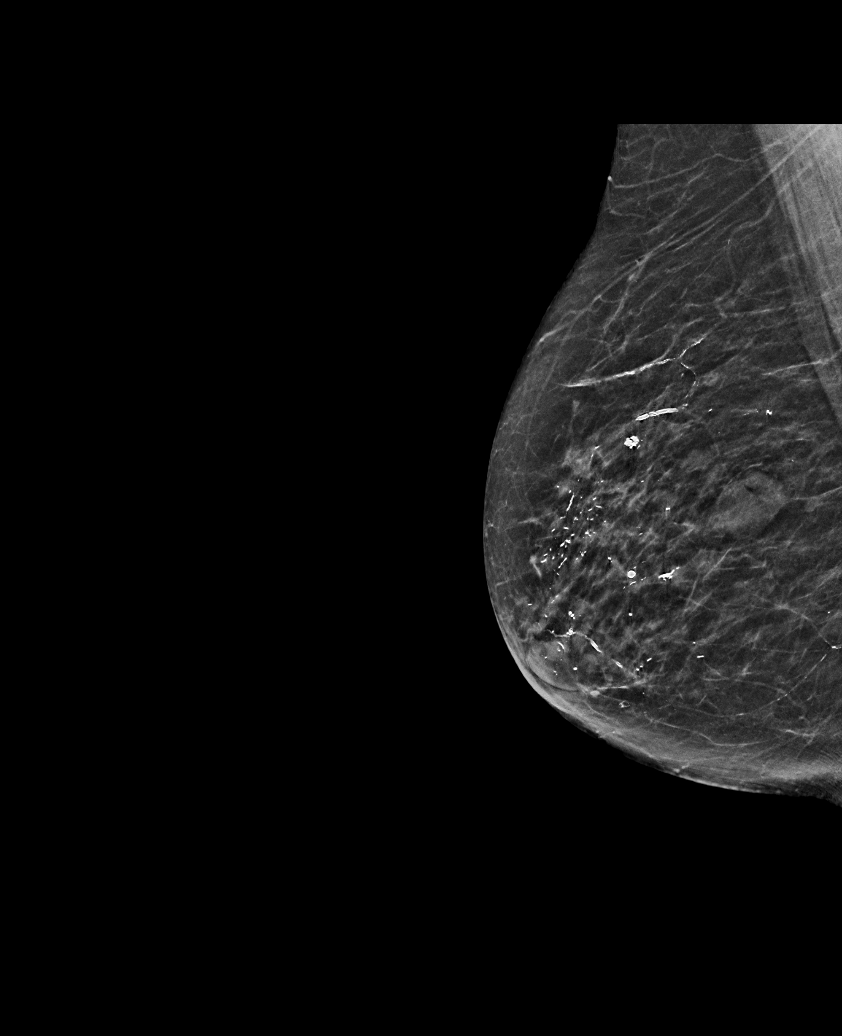

[L CC synth-2D]
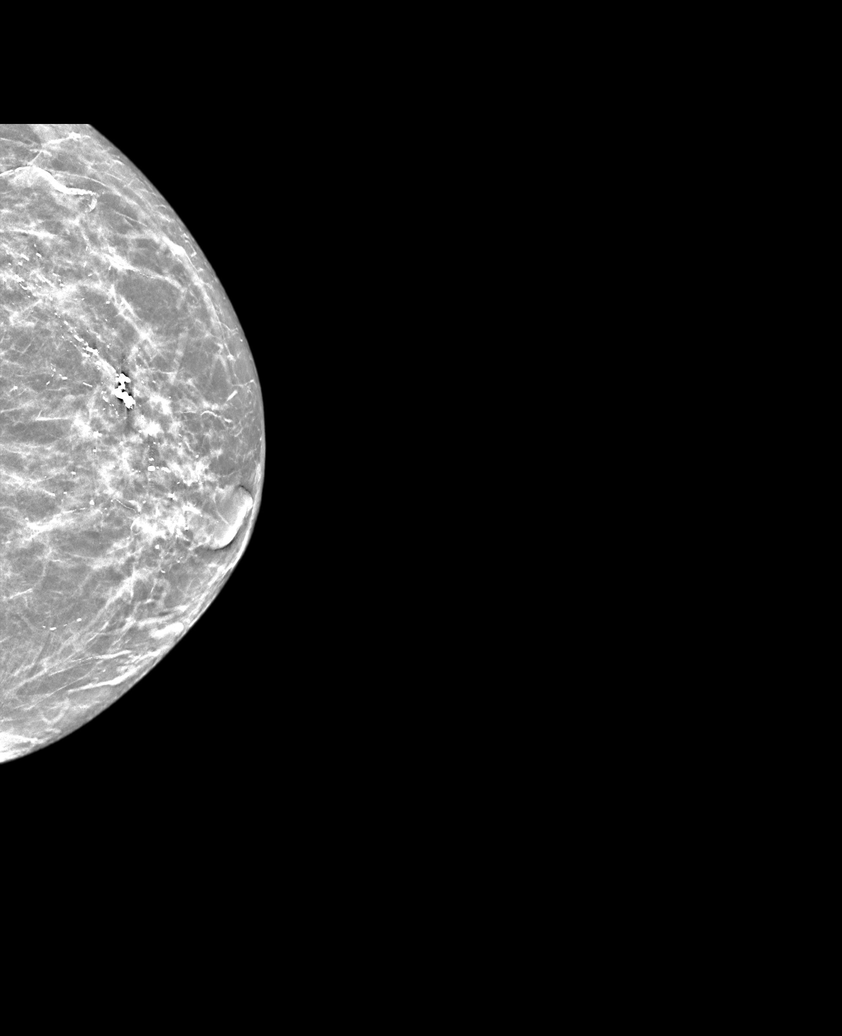

[L MLO synth-2D (2 of 2)]
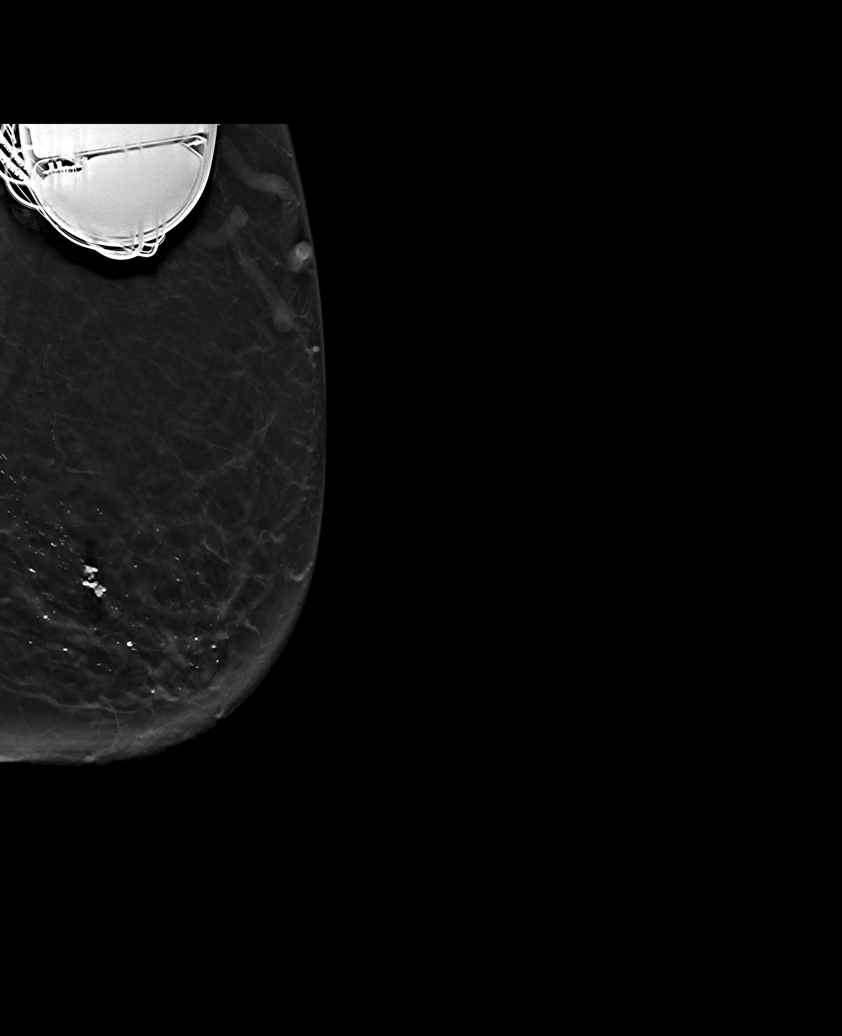

[R CC synth-2D]
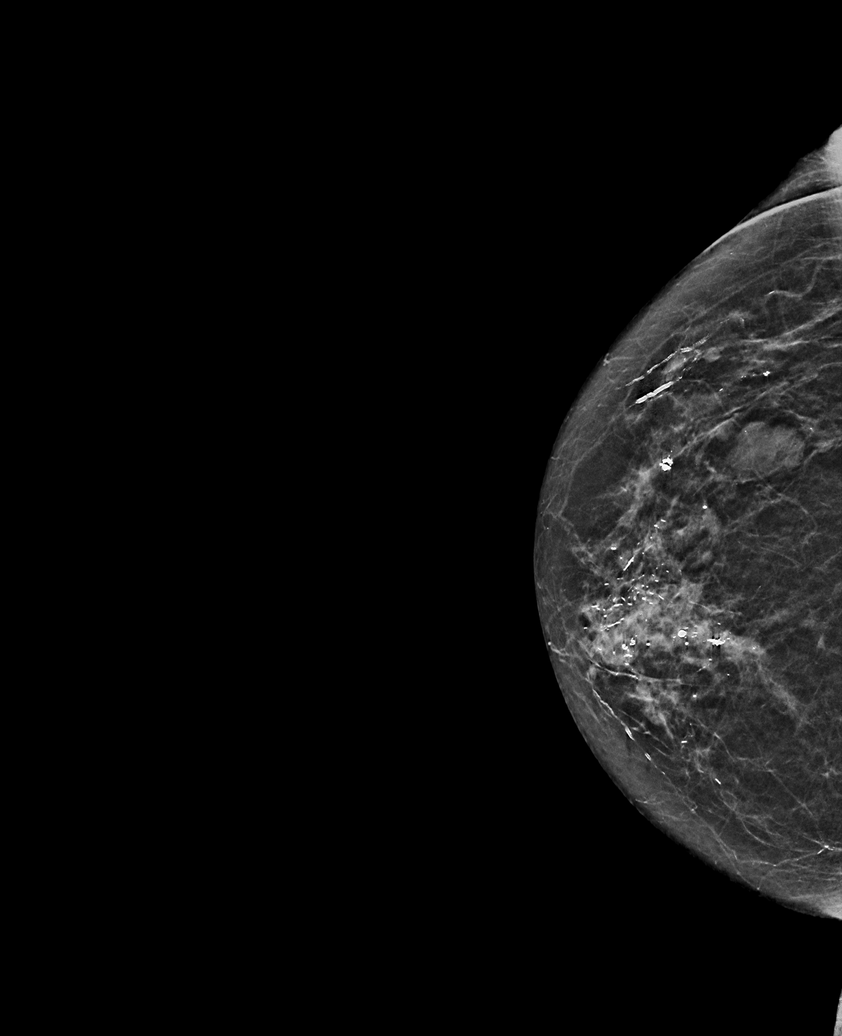

[L CC tomo · tomo slice 25/50.0]
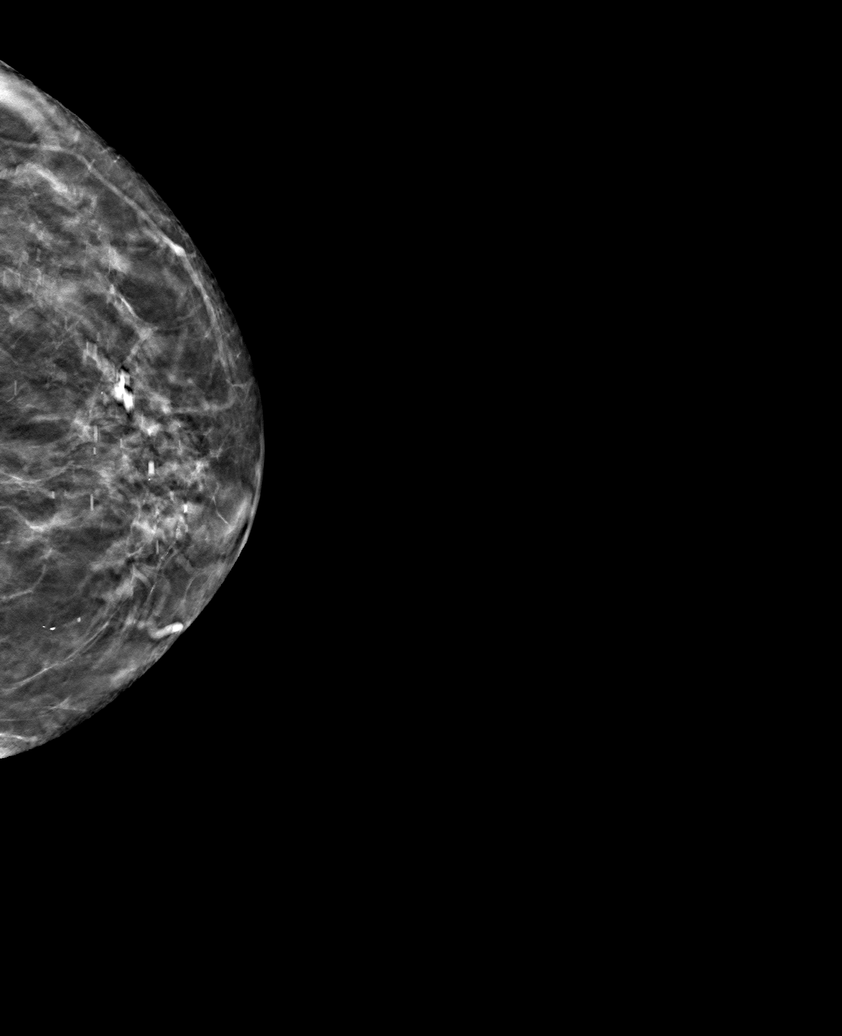

[6 of 30 positions shown; findings below may reference images not displayed]

ACR Breast Density Category c: The breast tissue is heterogeneously
dense, which may obscure small masses.
FINDINGS: There are no findings suspicious for malignancy. Images were
processed with CAD.
IMPRESSION: No mammographic evidence of malignancy. A result letter of this
screening mammogram will be mailed directly to the patient.

RECOMMENDATION:
Screening mammogram in one year. (Code:FT-U-LHB)

BI-RADS CATEGORY  1: Negative.

## 2019-04-01 ENCOUNTER — Telehealth: Payer: Self-pay

## 2019-04-01 NOTE — Progress Notes (Signed)
EPIC Encounter for ICM Monitoring  Patient Name: [name] [name] is a 85 y.o. female Date: 04/01/2019 Primary Care Physican: Fayrene Helper, MD Primary Cardiologist:Croitoru Electrophysiologist:Taylor Bi-V Pacing:99.9% LastWeight:195.8lbs       Attempted call to patient and unable to reach.   Transmission reviewed.   ER visit 6/25.  Optivol thoracic impedancenormal.  Prescribed:Furosemide40 mg 1 tablet daily. Metolazaone 2.5 mg Take 1tablet twicea week (Wednesday and Saturdays). Potassium 20 mEq take 1 tablet daily.  Labs: 03/20/2019 Creatinine 1.62, BUN 45, Potassium 4.5, Sodium 130, GFR 29-33 02/13/2019 Creatinine 1.50, BUN 49, Potassium 3.8, Sodium 136, GFR 32-37  01/28/2019 Creatinine 1.87, BUN 52, Potassium 4.6, Sodium 132, GFR 24-28  11/20/2018 Creatinine 1.66, BUN 63, Potassium 4.0, Sodium 140, GFR 28-32 09/26/2018 Creatinine 1.45, BUN 40, Potassium 4.1, Sodium 129, GFR 33-38 A complete set of results can be found in Results Review.  Recommendations:None  Follow-up plan: ICM clinic phone appointment on8/06/2019.   Copy of ICM check sent to Dr.Taylor.  3 month ICM trend: 03/31/2019    1 Year ICM trend:       [name], RN 04/01/2019 3:41 PM

## 2019-04-01 NOTE — Telephone Encounter (Signed)
Remote ICM transmission received.  Attempted call to patient regarding ICM remote transmission and line busy.  

## 2019-04-02 ENCOUNTER — Telehealth: Payer: Self-pay | Admitting: *Deleted

## 2019-04-02 NOTE — Telephone Encounter (Signed)
Patient called requesting to speak with Edrick Oh   [phone])

## 2019-04-03 ENCOUNTER — Other Ambulatory Visit: Payer: Self-pay | Admitting: *Deleted

## 2019-04-03 MED ORDER — WARFARIN SODIUM 5 MG PO TABS: ORAL_TABLET | ORAL | 3 refills | Status: DC

## 2019-04-03 NOTE — Telephone Encounter (Signed)
Spoke with representative at [facility].  Rx for [name] [name] has been received and will be mailed out tomorrow 04/04/19.  Copay $10 for 3/month supply. Will notify patient.

## 2019-04-06 ENCOUNTER — Encounter: Payer: Self-pay | Admitting: Family Medicine

## 2019-04-06 NOTE — Progress Notes (Signed)
   [name] [name]     MRN: [id number]      DOB: [birth year]   HPI [name] is here for follow up and re-evaluation following Ed visit on 03/20/2019 with SOB, evaluation did not merit admission, d dimer and her troponin was mildly elevated . Lasix increased slightly. States she is now doing better, less short of breath and less leg swelling, denies chest pain  The PT denies any adverse reactions to current medications since the last visit.  ED course is reviewed with patient and questions answered   ROS Denies recent fever or chills. Denies sinus pressure, nasal congestion, ear pain or sore throat. Denies chest congestion, productive cough or wheezing. Denies abdominal pain, nausea, vomiting,diarrhea or constipation.   Denies dysuria, frequency, hesitancy or incontinence. C/o chronic joint pain, swelling and limitation in mobility. Denies headaches, seizures, c/o chronic lower extremity numbness,and  tingling. Denies depression, anxiety or insomnia. Denies skin break down or rash.   PE  BP 138/62   Pulse 78   Resp 12   Ht 5\' 7"  (1.702 m)   Wt 197 lb 0.6 oz (89.4 kg)   SpO2 94% Comment: w/ 2 lpm supplemental oxygen  BMI 30.86 kg/m   Patient alert and oriented and in no cardiopulmonary distress.  HEENT: No facial asymmetry, EOMI,   oropharynx pink and moist.  Neck supple no JVD, no mass.  Chest: few bibasilar crackles, adequate air entry  Though reduced  CVS: S1, S2 systolic murmur, no S3.Regular rate.  ABD: Soft non tender.   Ext: No edema  MS: Decreased ROM spine, shoulders, hips and knees.  Skin: Intact, no ulcerations or rash noted.  Psych: Good eye contact, normal affect. Memory intact not anxious or depressed appearing.  CNS: CN 2-12 intact, power,  normal throughout.no focal deficits noted.   Assessment & Plan  Encounter for examination following treatment at hospital Ed course reviewed with the patient. Symptomatically improved, will f/u with Cardiology as  before  Chronic diastolic heart failure (HCC) Recent mil decompensation, however resolved currently, management as before  Hypothyroidism, postsurgical Controlled, no change in medication   Hyperlipidemia LDL goal <70 Hyperlipidemia:Low fat diet discussed and encouraged.   Lipid Panel  Lab Results  Component Value Date   CHOL 136 11/20/2018   HDL 44 (L) 11/20/2018   LDLCALC 76 11/20/2018   TRIG 81 11/20/2018   CHOLHDL 3.1 11/20/2018   Controlled, no change in medication     Chronic right hip pain Ongoing pain and debility, managed by Orthopedics

## 2019-04-06 NOTE — Assessment & Plan Note (Signed)
Ed course reviewed with the patient. Symptomatically improved, will f/u with Cardiology as before

## 2019-04-06 NOTE — Assessment & Plan Note (Signed)
Controlled, no change in medication  

## 2019-04-06 NOTE — Assessment & Plan Note (Signed)
Recent mil decompensation, however resolved currently, management as before

## 2019-04-06 NOTE — Assessment & Plan Note (Signed)
Ongoing pain and debility, managed by Orthopedics

## 2019-04-06 NOTE — Assessment & Plan Note (Signed)
Hyperlipidemia:Low fat diet discussed and encouraged.   Lipid Panel  Lab Results  Component Value Date   CHOL 136 11/20/2018   HDL 44 (L) 11/20/2018   LDLCALC 76 11/20/2018   TRIG 81 11/20/2018   CHOLHDL 3.1 11/20/2018   Controlled, no change in medication

## 2019-04-07 ENCOUNTER — Ambulatory Visit (INDEPENDENT_AMBULATORY_CARE_PROVIDER_SITE_OTHER): Payer: Medicare Other | Admitting: *Deleted

## 2019-04-07 DIAGNOSIS — Z952 Presence of prosthetic heart valve: Secondary | ICD-10-CM

## 2019-04-07 DIAGNOSIS — Z5181 Encounter for therapeutic drug level monitoring: Secondary | ICD-10-CM | POA: Diagnosis not present

## 2019-04-07 LAB — POCT INR: INR: 2.1 (ref 2.0–3.0)

## 2019-04-07 NOTE — Patient Instructions (Signed)
Continue coumadin 2 tablets daily except 1 tablet on Saturdays.  Continue greens  Recheck in 6 weeks.

## 2019-04-08 ENCOUNTER — Ambulatory Visit (INDEPENDENT_AMBULATORY_CARE_PROVIDER_SITE_OTHER): Payer: Medicare Other | Admitting: Orthopaedic Surgery

## 2019-04-08 ENCOUNTER — Other Ambulatory Visit: Payer: Self-pay

## 2019-04-08 ENCOUNTER — Encounter: Payer: Self-pay | Admitting: Orthopaedic Surgery

## 2019-04-08 VITALS — Temp 97.8°F

## 2019-04-08 DIAGNOSIS — M7061 Trochanteric bursitis, right hip: Secondary | ICD-10-CM | POA: Diagnosis not present

## 2019-04-08 DIAGNOSIS — G8929 Other chronic pain: Secondary | ICD-10-CM | POA: Diagnosis not present

## 2019-04-08 DIAGNOSIS — M25512 Pain in left shoulder: Secondary | ICD-10-CM

## 2019-04-08 MED ORDER — HYDROCODONE-ACETAMINOPHEN 7.5-325 MG PO TABS: 1.0000 | ORAL_TABLET | Freq: Four times a day (QID) | ORAL | 0 refills | Status: DC | PRN

## 2019-04-08 NOTE — Progress Notes (Signed)
PROCEDURE NOTE:  The patient request injection, verbal consent was obtained.  The left shoulder was prepped appropriately after time out was performed.   Sterile technique was observed and injection of 1 cc of Depo-Medrol 40 mg with several cc's of plain xylocaine. Anesthesia was provided by ethyl chloride and a 20-gauge needle was used to inject the shoulder area. A posterior approach was used.  The injection was tolerated well.  A band aid dressing was applied.  The patient was advised to apply ice later today and tomorrow to the injection sight as needed.  PROCEDURE NOTE:  The patient request injection, verbal consent was obtained.  The right trochanteric area of the hip was prepped appropriately after time out was performed.   Sterile technique was observed and injection of 1 cc of Depo-Medrol 40 mg with several cc's of plain xylocaine. Anesthesia was provided by ethyl chloride and a 20-gauge needle was used to inject the hip area. The injection was tolerated well.  A band aid dressing was applied.  The patient was advised to apply ice later today and tomorrow to the injection sight as needed.  I have reviewed the [place] web site prior to prescribing narcotic medicine for this patient.     Electronically Signed Sanjuana Kava, MD 7/14/20209:50 AM

## 2019-04-09 ENCOUNTER — Other Ambulatory Visit: Payer: Self-pay

## 2019-04-09 ENCOUNTER — Telehealth: Payer: Self-pay | Admitting: *Deleted

## 2019-04-09 MED ORDER — ALLOPURINOL 100 MG PO TABS: ORAL_TABLET | ORAL | 0 refills | Status: DC

## 2019-04-09 MED ORDER — METOPROLOL SUCCINATE ER 100 MG PO TB24: ORAL_TABLET | ORAL | 2 refills | Status: DC

## 2019-04-09 NOTE — Telephone Encounter (Signed)
Pt needs a refill on her allopurinol and metoprolol to the mail order however she only has 2 allopurinol left and would need some to hold her over until mail order could get there that can be sent [facility] on [address]

## 2019-04-09 NOTE — Telephone Encounter (Signed)
Done. 7 days sent to [facility]

## 2019-04-10 ENCOUNTER — Inpatient Hospital Stay (HOSPITAL_COMMUNITY): Payer: Medicare Other | Attending: Hematology

## 2019-04-10 ENCOUNTER — Other Ambulatory Visit: Payer: Self-pay

## 2019-04-10 ENCOUNTER — Inpatient Hospital Stay (HOSPITAL_COMMUNITY): Payer: Medicare Other

## 2019-04-10 ENCOUNTER — Ambulatory Visit (HOSPITAL_COMMUNITY): Payer: Medicare Other

## 2019-04-10 ENCOUNTER — Other Ambulatory Visit (HOSPITAL_COMMUNITY): Payer: Medicare Other

## 2019-04-10 VITALS — BP 127/45 | HR 61 | Temp 96.8°F | Resp 18

## 2019-04-10 DIAGNOSIS — D631 Anemia in chronic kidney disease: Secondary | ICD-10-CM | POA: Diagnosis not present

## 2019-04-10 DIAGNOSIS — D649 Anemia, unspecified: Secondary | ICD-10-CM

## 2019-04-10 DIAGNOSIS — N184 Chronic kidney disease, stage 4 (severe): Secondary | ICD-10-CM

## 2019-04-10 LAB — CBC WITH DIFFERENTIAL/PLATELET
Abs Immature Granulocytes: 0.03 K/uL (ref 0.00–0.07)
Basophils Absolute: 0 K/uL (ref 0.0–0.1)
Basophils Relative: 0 %
Eosinophils Absolute: 0 K/uL (ref 0.0–0.5)
Eosinophils Relative: 0 %
HCT: 32 % — ABNORMAL LOW (ref 36.0–46.0)
Hemoglobin: 9.9 g/dL — ABNORMAL LOW (ref 12.0–15.0)
Immature Granulocytes: 1 %
Lymphocytes Relative: 15 %
Lymphs Abs: 0.9 K/uL (ref 0.7–4.0)
MCH: 29.6 pg (ref 26.0–34.0)
MCHC: 30.9 g/dL (ref 30.0–36.0)
MCV: 95.8 fL (ref 80.0–100.0)
Monocytes Absolute: 0.4 K/uL (ref 0.1–1.0)
Monocytes Relative: 7 %
Neutro Abs: 4.5 K/uL (ref 1.7–7.7)
Neutrophils Relative %: 77 %
Platelets: 206 K/uL (ref 150–400)
RBC: 3.34 MIL/uL — ABNORMAL LOW (ref 3.87–5.11)
RDW: 15.3 % (ref 11.5–15.5)
WBC: 5.8 K/uL (ref 4.0–10.5)
nRBC: 0 % (ref 0.0–0.2)

## 2019-04-10 LAB — COMPREHENSIVE METABOLIC PANEL WITH GFR
ALT: 13 U/L (ref 0–44)
AST: 23 U/L (ref 15–41)
Albumin: 4.1 g/dL (ref 3.5–5.0)
Alkaline Phosphatase: 55 U/L (ref 38–126)
Anion gap: 12 (ref 5–15)
BUN: 47 mg/dL — ABNORMAL HIGH (ref 8–23)
CO2: 29 mmol/L (ref 22–32)
Calcium: 9.7 mg/dL (ref 8.9–10.3)
Chloride: 94 mmol/L — ABNORMAL LOW (ref 98–111)
Creatinine, Ser: 1.64 mg/dL — ABNORMAL HIGH (ref 0.44–1.00)
GFR calc Af Amer: 33 mL/min — ABNORMAL LOW
GFR calc non Af Amer: 28 mL/min — ABNORMAL LOW
Glucose, Bld: 84 mg/dL (ref 70–99)
Potassium: 3.8 mmol/L (ref 3.5–5.1)
Sodium: 135 mmol/L (ref 135–145)
Total Bilirubin: 0.5 mg/dL (ref 0.3–1.2)
Total Protein: 8.2 g/dL — ABNORMAL HIGH (ref 6.5–8.1)

## 2019-04-10 LAB — IRON AND TIBC
Iron: 58 ug/dL (ref 28–170)
Saturation Ratios: 19 % (ref 10.4–31.8)
TIBC: 311 ug/dL (ref 250–450)
UIBC: 253 ug/dL

## 2019-04-10 LAB — FERRITIN: Ferritin: 460 ng/mL — ABNORMAL HIGH (ref 11–307)

## 2019-04-10 LAB — LACTATE DEHYDROGENASE: LDH: 190 U/L (ref 98–192)

## 2019-04-10 MED ORDER — EPOETIN ALFA 40000 UNIT/ML IJ SOLN
40000.0000 [IU] | Freq: Once | INTRAMUSCULAR | Status: AC
  Administered 2019-04-10: 40000 [IU] via SUBCUTANEOUS
  Filled 2019-04-10: qty 1

## 2019-04-10 NOTE — Patient Instructions (Signed)
[facility]  Discharge Instructions:  Procrit received today _______________________________________________________________  Thank you for choosing [place] at [facility] to provide your oncology and hematology care.  To afford each patient quality time with our providers, please arrive at least 15 minutes before your scheduled appointment.  You need to re-schedule your appointment if you arrive 10 or more minutes late.  We strive to give you quality time with our providers, and arriving late affects you and other patients whose appointments are after yours.  Also, if you no show three or more times for appointments you may be dismissed from the clinic.  Again, thank you for choosing [facility] at [place] hope is that these requests will allow you access to exceptional care and in a timely manner. _______________________________________________________________  If you have questions after your visit, please contact our office at [phone] between the hours of 8:30 a.m. and 5:00 p.m. Voicemails left after 4:30 p.m. will not be returned until the following business day. _______________________________________________________________  For prescription refill requests, have your pharmacy contact our office. _______________________________________________________________  Recommendations made by the consultant and any test results will be sent to your referring physician. _______________________________________________________________

## 2019-04-10 NOTE — Progress Notes (Signed)
[name] [name] presents today for Procrit injection. Hemoglobin reviewed prior to administration. VSS. Injection tolerated without incident or complaint. See MAR for details. Patient discharged in satisfactory condition with follow up instructions.

## 2019-04-11 ENCOUNTER — Telehealth: Payer: Self-pay | Admitting: *Deleted

## 2019-04-11 ENCOUNTER — Other Ambulatory Visit: Payer: Self-pay

## 2019-04-11 DIAGNOSIS — L851 Acquired keratosis [keratoderma] palmaris et plantaris: Secondary | ICD-10-CM | POA: Diagnosis not present

## 2019-04-11 DIAGNOSIS — L603 Nail dystrophy: Secondary | ICD-10-CM | POA: Diagnosis not present

## 2019-04-11 DIAGNOSIS — E1142 Type 2 diabetes mellitus with diabetic polyneuropathy: Secondary | ICD-10-CM | POA: Diagnosis not present

## 2019-04-11 MED ORDER — METOPROLOL SUCCINATE ER 100 MG PO TB24: ORAL_TABLET | ORAL | 0 refills | Status: DC

## 2019-04-11 NOTE — Telephone Encounter (Signed)
Already sent to [employer]. Bridge supply sent to [employer]

## 2019-04-11 NOTE — Telephone Encounter (Signed)
Pt called needing a refill on the metoprolol she only has one pill left for today

## 2019-04-12 ENCOUNTER — Other Ambulatory Visit: Payer: Self-pay | Admitting: Family Medicine

## 2019-04-23 ENCOUNTER — Other Ambulatory Visit: Payer: Self-pay | Admitting: Family Medicine

## 2019-04-28 ENCOUNTER — Ambulatory Visit (INDEPENDENT_AMBULATORY_CARE_PROVIDER_SITE_OTHER): Payer: Medicare Other | Admitting: Family Medicine

## 2019-04-28 ENCOUNTER — Other Ambulatory Visit: Payer: Self-pay

## 2019-04-28 ENCOUNTER — Encounter: Payer: Self-pay | Admitting: Family Medicine

## 2019-04-28 VITALS — BP 150/76 | HR 86 | Temp 98.5°F | Resp 16 | Ht 67.0 in | Wt 197.0 lb

## 2019-04-28 DIAGNOSIS — J209 Acute bronchitis, unspecified: Secondary | ICD-10-CM | POA: Diagnosis not present

## 2019-04-28 DIAGNOSIS — I4819 Other persistent atrial fibrillation: Secondary | ICD-10-CM | POA: Diagnosis not present

## 2019-04-28 DIAGNOSIS — I10 Essential (primary) hypertension: Secondary | ICD-10-CM | POA: Diagnosis not present

## 2019-04-28 DIAGNOSIS — G8929 Other chronic pain: Secondary | ICD-10-CM

## 2019-04-28 DIAGNOSIS — N3001 Acute cystitis with hematuria: Secondary | ICD-10-CM

## 2019-04-28 DIAGNOSIS — J440 Chronic obstructive pulmonary disease with acute lower respiratory infection: Secondary | ICD-10-CM | POA: Diagnosis not present

## 2019-04-28 DIAGNOSIS — M25561 Pain in right knee: Secondary | ICD-10-CM

## 2019-04-28 DIAGNOSIS — R5381 Other malaise: Secondary | ICD-10-CM | POA: Diagnosis not present

## 2019-04-28 DIAGNOSIS — R319 Hematuria, unspecified: Secondary | ICD-10-CM

## 2019-04-28 DIAGNOSIS — I255 Ischemic cardiomyopathy: Secondary | ICD-10-CM | POA: Diagnosis not present

## 2019-04-28 DIAGNOSIS — E890 Postprocedural hypothyroidism: Secondary | ICD-10-CM

## 2019-04-28 LAB — POCT URINALYSIS DIP (CLINITEK)
Bilirubin, UA: NEGATIVE
Glucose, UA: NEGATIVE mg/dL
Ketones, POC UA: NEGATIVE mg/dL
Leukocytes, UA: NEGATIVE
Nitrite, UA: NEGATIVE
POC PROTEIN,UA: 30 — AB
Spec Grav, UA: 1.015 (ref 1.010–1.025)
Urobilinogen, UA: 0.2 E.U./dL
pH, UA: 7 (ref 5.0–8.0)

## 2019-04-28 MED ORDER — DOXYCYCLINE HYCLATE 100 MG PO TABS: 100.0000 mg | ORAL_TABLET | Freq: Two times a day (BID) | ORAL | 0 refills | Status: DC

## 2019-04-28 MED ORDER — BENZONATATE 100 MG PO CAPS: 100.0000 mg | ORAL_CAPSULE | Freq: Two times a day (BID) | ORAL | 1 refills | Status: DC | PRN

## 2019-04-28 NOTE — Patient Instructions (Signed)
Keep f/u end August , call if you need me sooner.  I have prescribed an antibiotic and decongestant for the bronchitis and presumed urinary tract infection.  Urine to be sent from office today for testing   We will send a message for evaluation by the social worker to see if we can help to get assistance in the home for you  Thanks for choosing [facility], we consider it a privelige to serve you.

## 2019-04-28 NOTE — Assessment & Plan Note (Signed)
S/W consult for limited in home help to be arranged

## 2019-04-28 NOTE — Assessment & Plan Note (Signed)
3 day h/o cough, thick sputum and wheeze. Tessalon perle and antibiotic

## 2019-04-29 ENCOUNTER — Other Ambulatory Visit (HOSPITAL_COMMUNITY)
Admission: RE | Admit: 2019-04-29 | Discharge: 2019-04-29 | Disposition: A | Discharge status: Home / Self Care | Payer: Medicare Other | Source: Ambulatory Visit | Attending: Family Medicine | Admitting: Family Medicine

## 2019-04-29 DIAGNOSIS — R319 Hematuria, unspecified: Secondary | ICD-10-CM | POA: Insufficient documentation

## 2019-04-29 LAB — URINALYSIS, ROUTINE W REFLEX MICROSCOPIC
Bacteria, UA: NONE SEEN
Bilirubin Urine: NEGATIVE
Glucose, UA: NEGATIVE mg/dL
Hgb urine dipstick: NEGATIVE
Ketones, ur: NEGATIVE mg/dL
Leukocytes,Ua: NEGATIVE
Nitrite: NEGATIVE
Protein, ur: 30 mg/dL — AB
Specific Gravity, Urine: 1.006 (ref 1.005–1.030)
pH: 7 (ref 5.0–8.0)

## 2019-04-30 ENCOUNTER — Telehealth: Payer: Self-pay | Admitting: *Deleted

## 2019-04-30 LAB — URINE CULTURE: Culture: NO GROWTH

## 2019-04-30 NOTE — Telephone Encounter (Signed)
Just an FYI. Verbal orders already given

## 2019-04-30 NOTE — Telephone Encounter (Signed)
Ben physical therapist with [facility] called wanted to let Dr. [name] know that he was supposed to start PT with Ms. Shan however she requested they start Monday. He needed verbal orders for a start date of 05-05-19. He can be reached at [phone]

## 2019-05-01 ENCOUNTER — Telehealth: Payer: Self-pay | Admitting: Family Medicine

## 2019-05-01 NOTE — Telephone Encounter (Signed)
Spoke with patient and let her know there was no infection on urine culture

## 2019-05-01 NOTE — Telephone Encounter (Signed)
Please call the pt to advise if we have received the labs back or not , tomorrow is fine 8-7

## 2019-05-01 NOTE — Telephone Encounter (Signed)
Noted, thank you

## 2019-05-05 ENCOUNTER — Ambulatory Visit (INDEPENDENT_AMBULATORY_CARE_PROVIDER_SITE_OTHER): Payer: Medicare Other

## 2019-05-05 DIAGNOSIS — I442 Atrioventricular block, complete: Secondary | ICD-10-CM | POA: Diagnosis not present

## 2019-05-05 DIAGNOSIS — Z95810 Presence of automatic (implantable) cardiac defibrillator: Secondary | ICD-10-CM | POA: Diagnosis not present

## 2019-05-05 DIAGNOSIS — E1122 Type 2 diabetes mellitus with diabetic chronic kidney disease: Secondary | ICD-10-CM | POA: Diagnosis not present

## 2019-05-05 DIAGNOSIS — F329 Major depressive disorder, single episode, unspecified: Secondary | ICD-10-CM | POA: Diagnosis not present

## 2019-05-05 DIAGNOSIS — N184 Chronic kidney disease, stage 4 (severe): Secondary | ICD-10-CM | POA: Diagnosis not present

## 2019-05-05 DIAGNOSIS — Z950 Presence of cardiac pacemaker: Secondary | ICD-10-CM | POA: Diagnosis not present

## 2019-05-05 DIAGNOSIS — M6281 Muscle weakness (generalized): Secondary | ICD-10-CM | POA: Diagnosis not present

## 2019-05-05 DIAGNOSIS — I8393 Asymptomatic varicose veins of bilateral lower extremities: Secondary | ICD-10-CM | POA: Diagnosis not present

## 2019-05-05 DIAGNOSIS — J209 Acute bronchitis, unspecified: Secondary | ICD-10-CM | POA: Diagnosis not present

## 2019-05-05 DIAGNOSIS — I5032 Chronic diastolic (congestive) heart failure: Secondary | ICD-10-CM | POA: Diagnosis not present

## 2019-05-05 DIAGNOSIS — J440 Chronic obstructive pulmonary disease with acute lower respiratory infection: Secondary | ICD-10-CM | POA: Diagnosis not present

## 2019-05-05 DIAGNOSIS — M1711 Unilateral primary osteoarthritis, right knee: Secondary | ICD-10-CM | POA: Diagnosis not present

## 2019-05-05 DIAGNOSIS — Z9981 Dependence on supplemental oxygen: Secondary | ICD-10-CM | POA: Diagnosis not present

## 2019-05-05 DIAGNOSIS — I130 Hypertensive heart and chronic kidney disease with heart failure and stage 1 through stage 4 chronic kidney disease, or unspecified chronic kidney disease: Secondary | ICD-10-CM | POA: Diagnosis not present

## 2019-05-05 DIAGNOSIS — Z7901 Long term (current) use of anticoagulants: Secondary | ICD-10-CM | POA: Diagnosis not present

## 2019-05-05 DIAGNOSIS — R262 Difficulty in walking, not elsewhere classified: Secondary | ICD-10-CM | POA: Diagnosis not present

## 2019-05-06 NOTE — Progress Notes (Signed)
EPIC Encounter for ICM Monitoring  Patient Name: [name] [name] is a 85 y.o. female Date: 05/06/2019 Primary Care Physican: Fayrene Helper, MD Primary Cardiologist:Croitoru Electrophysiologist:Taylor Bi-V Pacing:99.9% 8/11/2020Weight:190lbs       Spoke with patient and she said she is feeling fine.  Transmission reviewed.    Optivol thoracic impedancenormal.  Prescribed:Furosemide40 mg 1 tablet daily. Metolazaone 2.5 mg Take 1tablet twicea week (Wednesday and Saturdays). Potassium 20 mEq take 1 tablet daily.  Labs: 04/10/2019 Creatinine 1.64, BUN 47, Potassium 3.8, Sodium 135, GFR 28-33 03/20/2019 Creatinine 1.62, BUN 45, Potassium 4.5, Sodium 130, GFR 29-33 02/13/2019 Creatinine 1.50, BUN 49, Potassium 3.8, Sodium 136, GFR 32-37  01/28/2019 Creatinine 1.87, BUN 52, Potassium 4.6, Sodium 132, GFR 24-28  11/20/2018 Creatinine 1.66, BUN 63, Potassium 4.0, Sodium 140, GFR 28-32 09/26/2018 Creatinine 1.45, BUN 40, Potassium 4.1, Sodium 129, GFR 33-38 A complete set of results can be found in Results Review.  Recommendations:No changes and encouraged to call if experiencing any fluid symptoms.  Follow-up plan: ICM clinic phone appointment on10/02/2019.   Copy of ICM check sent to Dr.Taylor.   3 month ICM trend: 05/05/2019    1 Year ICM trend:       [name], RN 05/06/2019 3:23 PM

## 2019-05-08 ENCOUNTER — Inpatient Hospital Stay (HOSPITAL_COMMUNITY): Payer: Medicare Other

## 2019-05-08 ENCOUNTER — Inpatient Hospital Stay (HOSPITAL_COMMUNITY): Payer: Medicare Other | Attending: Hematology

## 2019-05-08 ENCOUNTER — Inpatient Hospital Stay (HOSPITAL_BASED_OUTPATIENT_CLINIC_OR_DEPARTMENT_OTHER): Payer: Medicare Other | Admitting: Nurse Practitioner

## 2019-05-08 ENCOUNTER — Other Ambulatory Visit: Payer: Self-pay

## 2019-05-08 DIAGNOSIS — J209 Acute bronchitis, unspecified: Secondary | ICD-10-CM | POA: Diagnosis not present

## 2019-05-08 DIAGNOSIS — M1711 Unilateral primary osteoarthritis, right knee: Secondary | ICD-10-CM | POA: Diagnosis not present

## 2019-05-08 DIAGNOSIS — D649 Anemia, unspecified: Secondary | ICD-10-CM

## 2019-05-08 DIAGNOSIS — N184 Chronic kidney disease, stage 4 (severe): Secondary | ICD-10-CM | POA: Diagnosis not present

## 2019-05-08 DIAGNOSIS — J440 Chronic obstructive pulmonary disease with acute lower respiratory infection: Secondary | ICD-10-CM | POA: Diagnosis not present

## 2019-05-08 DIAGNOSIS — I130 Hypertensive heart and chronic kidney disease with heart failure and stage 1 through stage 4 chronic kidney disease, or unspecified chronic kidney disease: Secondary | ICD-10-CM | POA: Diagnosis not present

## 2019-05-08 DIAGNOSIS — R262 Difficulty in walking, not elsewhere classified: Secondary | ICD-10-CM | POA: Diagnosis not present

## 2019-05-08 DIAGNOSIS — E611 Iron deficiency: Secondary | ICD-10-CM

## 2019-05-08 DIAGNOSIS — D631 Anemia in chronic kidney disease: Secondary | ICD-10-CM | POA: Diagnosis not present

## 2019-05-08 DIAGNOSIS — E1122 Type 2 diabetes mellitus with diabetic chronic kidney disease: Secondary | ICD-10-CM | POA: Diagnosis not present

## 2019-05-08 LAB — CBC
HCT: 33.7 % — ABNORMAL LOW (ref 36.0–46.0)
Hemoglobin: 10.3 g/dL — ABNORMAL LOW (ref 12.0–15.0)
MCH: 28.9 pg (ref 26.0–34.0)
MCHC: 30.6 g/dL (ref 30.0–36.0)
MCV: 94.7 fL (ref 80.0–100.0)
Platelets: 180 10*3/uL (ref 150–400)
RBC: 3.56 MIL/uL — ABNORMAL LOW (ref 3.87–5.11)
RDW: 15.9 % — ABNORMAL HIGH (ref 11.5–15.5)
WBC: 5.7 10*3/uL (ref 4.0–10.5)
nRBC: 0 % (ref 0.0–0.2)

## 2019-05-08 MED ORDER — EPOETIN ALFA 40000 UNIT/ML IJ SOLN
40000.0000 [IU] | Freq: Once | INTRAMUSCULAR | Status: AC
  Administered 2019-05-08: 40000 [IU] via SUBCUTANEOUS

## 2019-05-08 MED ORDER — EPOETIN ALFA 40000 UNIT/ML IJ SOLN
INTRAMUSCULAR | Status: AC
  Filled 2019-05-08: qty 1

## 2019-05-08 NOTE — Patient Instructions (Signed)
[facility] at [facility] Discharge Instructions  Follow up in 3 months with labs    Thank you for choosing [facility] at [facility] to provide your oncology and hematology care.  To afford each patient quality time with our provider, please arrive at least 15 minutes before your scheduled appointment time.   If you have a lab appointment with the Cancer Center please come in thru the  Main Entrance and check in at the main information desk  You need to re-schedule your appointment should you arrive 10 or more minutes late.  We strive to give you quality time with our providers, and arriving late affects you and other patients whose appointments are after yours.  Also, if you no show three or more times for appointments you may be dismissed from the clinic at the providers discretion.     Again, thank you for choosing [facility].  Our hope is that these requests will decrease the amount of time that you wait before being seen by our physicians.       _____________________________________________________________  Should you have questions after your visit to [facility], please contact our office at [phone] between the hours of 8:00 a.m. and 4:30 p.m.  Voicemails left after 4:00 p.m. will not be returned until the following business day.  For prescription refill requests, have your pharmacy contact our office and allow 72 hours.    Cancer Center Support Programs:   > Cancer Support Group  2nd Tuesday of the month 1pm-2pm, Journey Room    

## 2019-05-08 NOTE — Progress Notes (Signed)
[name] [name], [name] [name] [id number]   CLINIC:  Medical Oncology/Hematology  PCP:  [name] Helper, MD [address] [place] [address] [phone]   REASON FOR VISIT: Follow-up for normocytic anemia  CURRENT THERAPY: Procrit injections every 4 weeks    INTERVAL HISTORY:  [name] [name] 85 y.o. female returns for routine follow-up for normocytic anemia.  Patient reports she has been doing well she does have occasional problems with constipation and dizziness.  Otherwise she feels great since her last visit.  She denies any bright red bleeding per rectum or melena. Denies any nausea, vomiting, or diarrhea. Denies any new pains. Had not noticed any recent bleeding such as epistaxis, hematuria or hematochezia. Denies recent chest pain on exertion, shortness of breath on minimal exertion, pre-syncopal episodes, or palpitations. Denies any numbness or tingling in hands or feet. Denies any recent fevers, infections, or recent hospitalizations. Patient reports appetite at 75% and energy level at 0%.  She is eating well and maintaining her weight at this time.    REVIEW OF SYSTEMS:  Review of Systems  Gastrointestinal: Positive for constipation.  Neurological: Positive for dizziness and numbness.  All other systems reviewed and are negative.    PAST MEDICAL/SURGICAL HISTORY:  Past Medical History:  Diagnosis Date  . Adenomatous polyp of colon 12/16/2002   Dr. [name]/[facility]  . Allergy   . Cataract   . CHB (complete heart block) ([place]) 10/07/2014      . CHF (congestive heart failure) ([place])    a.  EF previously reduced to 25-30% b. EF improved to 60-65% by echo in 10/2017.   Marland Kitchen Chronic kidney disease, stage I 2006  . Constipation       . COPD (chronic obstructive pulmonary disease) ([place])       . DJD (degenerative joint disease) of lumbar spine   . DM (diabetes mellitus) ([place]) 2006  . Esophageal  reflux   . Glaucoma   . Gout       . H/O heart valve replacement with mechanical valve    a. s/p mechanical AVR in 2001 and mechanical MVR in 2004.  Marland Kitchen H/O wisdom tooth extraction   . Hyperlipemia   . IDA (iron deficiency anemia)    Parenteral iron/[name]  . Insomnia       . Non-ischemic cardiomyopathy ([place])    a. s/p MDT CRTD  . Obesity, unspecified   . Other and unspecified hyperlipidemia   . Oxygen deficiency 2014   nocturnal;  . Spondylolisthesis   . Spondylosis   . Thyroid cancer ([place])    remote thyroidectomy, no recurrence, pt denies in 2016 thyroid cancer  . Unspecified hypothyroidism       . 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 Vertigo        Past Surgical History:  Procedure Laterality Date  . AORTIC AND MITRAL VALVE REPLACEMENT  2001  . BI-VENTRICULAR IMPLANTABLE CARDIOVERTER DEFIBRILLATOR UPGRADE N/A 01/18/2015   Procedure: BI-VENTRICULAR IMPLANTABLE CARDIOVERTER DEFIBRILLATOR UPGRADE;  Surgeon: Evans Lance, MD;  Location: [facility] CATH LAB;  Service: Cardiovascular;  Laterality: N/A;  . BIV ICD GENERATOR CHANGEOUT N/A 01/30/2019   Procedure: BIV ICD GENERATOR CHANGEOUT;  Surgeon: Evans Lance, MD;  Location: [facility] CV LAB;  Service: Cardiovascular;  Laterality: N/A;  . CARDIAC CATHETERIZATION    . CARDIAC VALVE REPLACEMENT    . CARDIOVERSION N/A 11/13/2016   Procedure:  CARDIOVERSION;  Surgeon: Sanda Klein, MD;  Location: [facility] ENDOSCOPY;  Service: Cardiovascular;  Laterality: N/A;  . COLONOSCOPY  06/12/2007   Rourk- Normal rectum/Normal colon  . COLONOSCOPY  12/16/02   small hemorrhoids  . COLONOSCOPY  06/20/2012   Procedure: COLONOSCOPY;  Surgeon: Daneil Dolin, MD;  Location: [facility] ENDO SU[facility];  Service: Endoscopy;  Laterality: N/A;  10:45  . defibrillator implanted 2006    . DOPPLER ECHOCARDIOGRAPHY  2012  . DOPPLER ECHOCARDIOGRAPHY  05,06,07,08,09,2011  . ESOPHAGOGASTRODUODENOSCOPY  06/12/2007   Rourk- normal esophagus,  small hiatal hernia, otherwise normal stomach, D1, D2  . EYE SURGERY Right 2005  . EYE SURGERY Left 2006  . ICD LEAD REMOVAL Left 02/08/2015   Procedure: ICD LEAD REMOVAL;  Surgeon: Evans Lance, MD;  Location: [facility] OR;  Service: Cardiovascular;  Laterality: Left;  "Will plan extraction and insertion of a BiV PM"  **Dr. Roxan Hockey backing up case**  . IMPLANTABLE CARDIOVERTER DEFIBRILLATOR (ICD) GENERATOR CHANGE Left 02/08/2015   Procedure: ICD GENERATOR CHANGE;  Surgeon: Evans Lance, MD;  Location: [facility];  Service: Cardiovascular;  Laterality: Left;  . INSERT / REPLACE / REMOVE PACEMAKER    . PACEMAKER INSERTION  May 2016  . pacemaker placed  2004  . right breast cyst removed     benign   . right cataract removed     2005  . TEE WITHOUT CARDIOVERSION N/A 11/13/2016   Procedure: TRANSESOPHAGEAL ECHOCARDIOGRAM (TEE);  Surgeon: Sanda Klein, MD;  Location[facility] ENDOSCOPY;  Service: Cardiovascular;  Laterality: N/A;  . THYROIDECTOMY       SOCIAL HISTORY:  Social History   Socioeconomic History  . Marital status: Widowed    Spouse name: Not on file  . Number of children: 0  . Years of education: Not on file  . Highest education level: Not on file  Occupational History  . Occupation: retired    Fish farm manager: RETIRED  Social Needs  . Financial resource strain: Somewhat hard  . Food insecurity    Worry: Never true    Inability: Never true  . Transportation needs    Medical: No    Non-medical: No  Tobacco Use  . Smoking status: Former Smoker    Packs/day: 0.75    Years: 40.00    Pack years: 30.00    Types: Cigarettes    Quit date: 03/08/1987    Years since quitting: 32.1  . Smokeless tobacco: Never Used  Substance and Sexual Activity  . Alcohol use: No    Alcohol/week: 0.0 standard drinks  . Drug use: No  . Sexual activity: Not Currently  Lifestyle  . Physical activity    Days per week: 2 days    Minutes per session: 20 min  . Stress: Not at all  Relationships  .  Social connections    Talks on phone: More than three times a week    Gets together: Never    Attends religious service: More than 4 times per year    Active member of club or organization: No    Attends meetings of clubs or organizations: Never    Relationship status: Widowed  . Intimate partner violence    Fear of current or ex partner: No    Emotionally abused: No    Physically abused: No    Forced sexual activity: No  Other Topics Concern  . Not on file  Social History Narrative   From [place] (2004)    FAMILY HISTORY:  Family History  Problem Relation  Age of Onset  . Pancreatic cancer Mother   . Heart disease Father   . Heart disease Sister   . Heart attack Sister   . Heart disease Brother   . Diabetes Brother   . Heart disease Brother   . Diabetes Brother   . Hypertension Brother   . Lung cancer Brother     CURRENT MEDICATIONS:  Outpatient Encounter Medications as of 05/08/2019  Medication Sig Note  . acetaminophen (TYLENOL) 500 MG tablet Take 500 mg by mouth every 6 (six) hours as needed (pain).   Marland Kitchen albuterol (PROVENTIL HFA;VENTOLIN HFA) 108 (90 Base) MCG/ACT inhaler INHALE 2 PUFFS INTO THE LUNGS EVERY 6 HOURS AS NEEDED FOR WHEEZING OR SHORTNESS OF BREATH (Patient taking differently: Inhale 2 puffs into the lungs every 6 (six) hours as needed for wheezing or shortness of breath. )   . albuterol (PROVENTIL) (2.5 MG/3ML) 0.083% nebulizer solution INHALE 1 VIAL VIA NEBULIZER THREE TIMES DAILY. (Patient taking differently: Take 2.5 mg by nebulization 2 (two) times daily. )   . allopurinol (ZYLOPRIM) 100 MG tablet TAKE 1 TABLET(100 MG) BY MOUTH DAILY   . benzonatate (TESSALON) 100 MG capsule Take 1 capsule (100 mg total) by mouth 2 (two) times daily as needed for cough.   Marland Kitchen BREO ELLIPTA 100-25 MCG/INH AEPB TAKE 1 INHALATION BY MOUTH ONCE DAILY   . cholecalciferol (VITAMIN D3) 25 MCG (1000 UT) tablet Take 1,000 Units by mouth daily.   . diphenhydramine-acetaminophen  (TYLENOL PM) 25-500 MG TABS tablet Take 1 tablet by mouth at bedtime as needed (sleep).   . docusate sodium (COLACE) 100 MG capsule Take 300 mg by mouth at bedtime as needed for mild constipation.    Marland Kitchen doxycycline (VIBRA-TABS) 100 MG tablet Take 1 tablet (100 mg total) by mouth 2 (two) times daily.   . fluticasone (FLONASE) 50 MCG/ACT nasal spray SHAKE LIQUID AND USE 2 SPRAYS IN EACH NOSTRIL DAILY (Patient taking differently: Place 2 sprays into both nostrils daily as needed for allergies. )   . folic acid (FOLVITE) 1 MG tablet Take 1 tablet (1 mg total) by mouth daily. (Patient taking differently: Take 1 mg by mouth at bedtime. )   . furosemide (LASIX) 40 MG tablet Take 1 tablet (40 mg total) by mouth daily. Skip dose today and start from 1/4 (Patient taking differently: Take 40 mg by mouth daily. )   . gabapentin (NEURONTIN) 300 MG capsule TAKE 1 CAPSULE BY MOUTH AT BEDTIME   . HYDROcodone-acetaminophen (NORCO) 7.5-325 MG tablet Take 1 tablet by mouth every 6 (six) hours as needed for moderate pain (Must last 30 days.).   Marland Kitchen isosorbide mononitrate (IMDUR) 60 MG 24 hr tablet Take 1 tablet (60 mg total) by mouth daily. (Patient taking differently: Take 60 mg by mouth at bedtime. )   . Liniments (SALONPAS PAIN RELIEF PATCH EX) Apply 1 patch topically daily as needed (pain).   . meclizine (ANTIVERT) 12.5 MG tablet Take 1 tablet (12.5 mg total) by mouth 3 (three) times daily as needed for dizziness.   . metoprolol succinate (TOPROL-XL) 100 MG 24 hr tablet TAKE 1 TABLET BY MOUTH DAILY. TAKE WITH OR IMMEDIATELY FOLLOWING A MEAL.   . Multiple Vitamin (MULTIVITAMIN) LIQD Take 5 mLs by mouth daily.   . ondansetron (ZOFRAN) 4 MG tablet Take 1 tablet (4 mg total) by mouth daily as needed for nausea or vomiting.   [name] [name] (SYSTANE) 0.4-0.3 % GEL ophthalmic gel Place 1 application into the left eye  at bedtime.   . potassium chloride SA (K-DUR) 20 MEQ tablet TAKE 1 TABLET BY MOUTH DAILY  (Patient taking differently: Take 20 mEq by mouth daily. )   . pravastatin (PRAVACHOL) 40 MG tablet TAKE 1 TABLET EVERY EVENING   . SYNTHROID 75 MCG tablet TAKE 1 TABLET BY MOUTH DAILY   . warfarin (JANTOVEN) 5 MG tablet Take 2 tablets daily except 1 tablet on Saturdays   . hydrALAZINE (APRESOLINE) 25 MG tablet TAKE 1 TABLET THREE TIMES A DAY (Patient not taking: No sig reported) 04/28/2019: Kidney dr stopped  . metolazone (ZAROXOLYN) 2.5 MG tablet Take 1 tablet (2.5 mg total) by mouth 2 (two) times a week. Wednesdays and Saturdays  Skip dose on 1/4 and start from 1/8 (Patient not taking: Reported on 04/28/2019)    Facility-Administered Encounter Medications as of 05/08/2019  Medication  . epoetin alfa (EPOGEN,PROCRIT) injection 40,000 Units    ALLERGIES:  Allergies  Allergen Reactions  . Penicillins Other (See Comments)    Bruise Did it involve swelling of the face/tongue/throat, SOB, or low BP? No Did it involve sudden or severe rash/hives, skin peeling, or any reaction on the inside of your mouth or nose? No Did you need to seek medical attention at a hospital or doctor's office? No When did it last happen?20+ years If all above answers are "NO", may proceed with cephalosporin use.   . Aspirin Other (See Comments)    On coumadin  . Fentanyl Dermatitis    Rash with patch   . Niacin Itching     PHYSICAL EXAM:  ECOG Performance status: 1  Vitals:   05/08/19 1040  BP: (!) 149/73  Pulse: 73  Resp: 14  Temp: 97.9 F (36.6 C)  SpO2: 96%   Filed Weights   05/08/19 1040  Weight: 189 lb (85.7 kg)    Physical Exam Constitutional:      Appearance: Normal appearance. She is normal weight.  Cardiovascular:     Rate and Rhythm: Normal rate and regular rhythm.     Heart sounds: Normal heart sounds.  Pulmonary:     Effort: Pulmonary effort is normal.     Breath sounds: Normal breath sounds.  Abdominal:     General: Bowel sounds are normal.     Palpations: Abdomen is  soft.  Musculoskeletal: Normal range of motion.  Skin:    General: Skin is warm and dry.  Neurological:     Mental Status: She is alert and oriented to person, place, and time. Mental status is at baseline.  Psychiatric:        Mood and Affect: Mood normal.        Behavior: Behavior normal.        Thought Content: Thought content normal.        Judgment: Judgment normal.      LABORATORY DATA:  I have reviewed the labs as listed.  CBC    Component Value Date/Time   WBC 5.7 05/08/2019 0953   RBC 3.56 (L) 05/08/2019 0953   HGB 10.3 (L) 05/08/2019 0953   HGB 9.6 (L) 01/28/2019 1218   HCT 33.7 (L) 05/08/2019 0953   HCT 29.9 (L) 01/28/2019 1218   PLT 180 05/08/2019 0953   PLT 199 01/28/2019 1218   MCV 94.7 05/08/2019 0953   MCV 90 01/28/2019 1218   MCH 28.9 05/08/2019 0953   MCHC 30.6 05/08/2019 0953   RDW 15.9 (H) 05/08/2019 0953   RDW 17.1 (H) 01/28/2019 1218   LYMPHSABS 0.9 04/10/2019  1106   LYMPHSABS 0.7 01/28/2019 1218   MONOABS 0.4 04/10/2019 1106   EOSABS 0.0 04/10/2019 1106   EOSABS 0.0 01/28/2019 1218   BASOSABS 0.0 04/10/2019 1106   BASOSABS 0.0 01/28/2019 1218   CMP Latest Ref Rng & Units 04/10/2019 03/20/2019 02/13/2019  Glucose 70 - 99 mg/dL 84 91 100(H)  BUN 8 - 23 mg/dL 47(H) 45(H) 49(H)  Creatinine 0.44 - 1.00 mg/dL 1.64(H) 1.62(H) 1.50(H)  Sodium 135 - 145 mmol/L 135 130(L) 136  Potassium 3.5 - 5.1 mmol/L 3.8 4.5 3.8  Chloride 98 - 111 mmol/L 94(L) 89(L) 95(L)  CO2 22 - 32 mmol/L 29 30 29   Calcium 8.9 - 10.3 mg/dL 9.7 9.3 9.6  Total Protein 6.5 - 8.1 g/dL 8.2(H) 7.3 8.1  Total Bilirubin 0.3 - 1.2 mg/dL 0.5 0.5 0.5  Alkaline Phos 38 - 126 U/L 55 58 59  AST 15 - 41 U/L 23 21 24   ALT 0 - 44 U/L 13 13 12     I personally performed a face-to-face visit.  All questions were answered to patient's stated satisfaction. Encouraged patient to call with any new concerns or questions before his next visit to the cancer center and we can certain see him sooner, if  needed.     ASSESSMENT & PLAN:   Iron deficiency 1.  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 -She has been on Procrit that initially started 3 years ago, with overall stability in her hemoglobin. -She is now on Procrit injections 40,000 units every 4 weeks. -She is taking iron tablets twice daily and tolerating them well. -Labs on 05/08/2019 showed her hemoglobin 10.3 -She will continue the Procrit injections every 4 weeks at this time. -She will follow-up in 3 months with repeat labs  2.  Chronic kidney disease: -Serum creatinine is stable at 1.5. -Continue to follow-up with nephrology.        Orders placed this encounter:  Orders Placed This Encounter  Procedures  . Lactate dehydrogenase  . CBC with Differential/Platelet  . Comprehensive metabolic panel  . Ferritin  . Iron and TIBC  . Vitamin B12  . VITAMIN D 25 Hydroxy (Vit-D Deficiency, Fractures)  . Folate      Francene Finders, FNP-C [place] [phone]

## 2019-05-08 NOTE — Assessment & Plan Note (Signed)
1.  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 -She has been on Procrit that initially started 3 years ago, with overall stability in her hemoglobin. -She is now on Procrit injections 40,000 units every 4 weeks. -She is taking iron tablets twice daily and tolerating them well. -Labs on 05/08/2019 showed her hemoglobin 10.3 -She will continue the Procrit injections every 4 weeks at this time. -She will follow-up in 3 months with repeat labs  2.  Chronic kidney disease: -Serum creatinine is stable at 1.5. -Continue to follow-up with nephrology.

## 2019-05-08 NOTE — Progress Notes (Signed)
[name] [name] presents today for injection per MD orders. Procrit 40,000 units  administered SQ in right Upper Arm. Administration without incident. Patient tolerated well.  

## 2019-05-09 DIAGNOSIS — I130 Hypertensive heart and chronic kidney disease with heart failure and stage 1 through stage 4 chronic kidney disease, or unspecified chronic kidney disease: Secondary | ICD-10-CM | POA: Diagnosis not present

## 2019-05-09 DIAGNOSIS — Z950 Presence of cardiac pacemaker: Secondary | ICD-10-CM

## 2019-05-09 DIAGNOSIS — J440 Chronic obstructive pulmonary disease with acute lower respiratory infection: Secondary | ICD-10-CM | POA: Diagnosis not present

## 2019-05-09 DIAGNOSIS — R262 Difficulty in walking, not elsewhere classified: Secondary | ICD-10-CM | POA: Diagnosis not present

## 2019-05-09 DIAGNOSIS — N184 Chronic kidney disease, stage 4 (severe): Secondary | ICD-10-CM | POA: Diagnosis not present

## 2019-05-09 DIAGNOSIS — Z7901 Long term (current) use of anticoagulants: Secondary | ICD-10-CM | POA: Diagnosis not present

## 2019-05-09 DIAGNOSIS — M6281 Muscle weakness (generalized): Secondary | ICD-10-CM

## 2019-05-09 DIAGNOSIS — F329 Major depressive disorder, single episode, unspecified: Secondary | ICD-10-CM | POA: Diagnosis not present

## 2019-05-09 DIAGNOSIS — E1122 Type 2 diabetes mellitus with diabetic chronic kidney disease: Secondary | ICD-10-CM | POA: Diagnosis not present

## 2019-05-09 DIAGNOSIS — Z95810 Presence of automatic (implantable) cardiac defibrillator: Secondary | ICD-10-CM

## 2019-05-09 DIAGNOSIS — M1711 Unilateral primary osteoarthritis, right knee: Secondary | ICD-10-CM | POA: Diagnosis not present

## 2019-05-09 DIAGNOSIS — I5032 Chronic diastolic (congestive) heart failure: Secondary | ICD-10-CM | POA: Diagnosis not present

## 2019-05-09 DIAGNOSIS — I8393 Asymptomatic varicose veins of bilateral lower extremities: Secondary | ICD-10-CM | POA: Diagnosis not present

## 2019-05-09 DIAGNOSIS — J209 Acute bronchitis, unspecified: Secondary | ICD-10-CM | POA: Diagnosis not present

## 2019-05-09 DIAGNOSIS — I442 Atrioventricular block, complete: Secondary | ICD-10-CM | POA: Diagnosis not present

## 2019-05-09 DIAGNOSIS — Z9981 Dependence on supplemental oxygen: Secondary | ICD-10-CM

## 2019-05-13 DIAGNOSIS — M1711 Unilateral primary osteoarthritis, right knee: Secondary | ICD-10-CM | POA: Diagnosis not present

## 2019-05-13 DIAGNOSIS — J440 Chronic obstructive pulmonary disease with acute lower respiratory infection: Secondary | ICD-10-CM | POA: Diagnosis not present

## 2019-05-13 DIAGNOSIS — R262 Difficulty in walking, not elsewhere classified: Secondary | ICD-10-CM | POA: Diagnosis not present

## 2019-05-13 DIAGNOSIS — J209 Acute bronchitis, unspecified: Secondary | ICD-10-CM | POA: Diagnosis not present

## 2019-05-13 DIAGNOSIS — I130 Hypertensive heart and chronic kidney disease with heart failure and stage 1 through stage 4 chronic kidney disease, or unspecified chronic kidney disease: Secondary | ICD-10-CM | POA: Diagnosis not present

## 2019-05-13 DIAGNOSIS — E1122 Type 2 diabetes mellitus with diabetic chronic kidney disease: Secondary | ICD-10-CM | POA: Diagnosis not present

## 2019-05-15 DIAGNOSIS — R262 Difficulty in walking, not elsewhere classified: Secondary | ICD-10-CM | POA: Diagnosis not present

## 2019-05-15 DIAGNOSIS — M1711 Unilateral primary osteoarthritis, right knee: Secondary | ICD-10-CM | POA: Diagnosis not present

## 2019-05-15 DIAGNOSIS — J440 Chronic obstructive pulmonary disease with acute lower respiratory infection: Secondary | ICD-10-CM | POA: Diagnosis not present

## 2019-05-15 DIAGNOSIS — E1122 Type 2 diabetes mellitus with diabetic chronic kidney disease: Secondary | ICD-10-CM | POA: Diagnosis not present

## 2019-05-15 DIAGNOSIS — J209 Acute bronchitis, unspecified: Secondary | ICD-10-CM | POA: Diagnosis not present

## 2019-05-15 DIAGNOSIS — I130 Hypertensive heart and chronic kidney disease with heart failure and stage 1 through stage 4 chronic kidney disease, or unspecified chronic kidney disease: Secondary | ICD-10-CM | POA: Diagnosis not present

## 2019-05-16 ENCOUNTER — Other Ambulatory Visit: Payer: Self-pay | Admitting: Cardiovascular Disease

## 2019-05-16 ENCOUNTER — Other Ambulatory Visit: Payer: Self-pay

## 2019-05-16 ENCOUNTER — Ambulatory Visit (INDEPENDENT_AMBULATORY_CARE_PROVIDER_SITE_OTHER): Payer: Medicare Other | Admitting: *Deleted

## 2019-05-16 ENCOUNTER — Ambulatory Visit (INDEPENDENT_AMBULATORY_CARE_PROVIDER_SITE_OTHER): Payer: Medicare Other | Admitting: Internal Medicine

## 2019-05-16 VITALS — BP 144/68 | HR 63 | Ht 67.0 in | Wt 196.0 lb

## 2019-05-16 DIAGNOSIS — I442 Atrioventricular block, complete: Secondary | ICD-10-CM

## 2019-05-16 DIAGNOSIS — I255 Ischemic cardiomyopathy: Secondary | ICD-10-CM | POA: Diagnosis not present

## 2019-05-16 DIAGNOSIS — I4819 Other persistent atrial fibrillation: Secondary | ICD-10-CM

## 2019-05-16 DIAGNOSIS — Z95810 Presence of automatic (implantable) cardiac defibrillator: Secondary | ICD-10-CM

## 2019-05-16 DIAGNOSIS — I5032 Chronic diastolic (congestive) heart failure: Secondary | ICD-10-CM

## 2019-05-16 LAB — CUP PACEART INCLINIC DEVICE CHECK
Battery Remaining Longevity: 68 mo
Battery Voltage: 3.06 V
Brady Statistic AP VP Percent: 0 %
Brady Statistic AP VS Percent: 0 %
Brady Statistic AS VP Percent: 99.81 %
Brady Statistic AS VS Percent: 0.19 %
Brady Statistic RA Percent Paced: 0 %
Brady Statistic RV Percent Paced: 99.81 %
Date Time Interrogation Session: 20200821174846
Implantable Lead Implant Date: 20040521
Implantable Lead Implant Date: 20071218
Implantable Lead Implant Date: 20160516
Implantable Lead Location: 753858
Implantable Lead Location: 753859
Implantable Lead Location: 753860
Implantable Lead Model: 5076
Implantable Lead Model: 6947
Implantable Pulse Generator Implant Date: 20200506
Lead Channel Impedance Value: 285 Ohm
Lead Channel Impedance Value: 285 Ohm
Lead Channel Impedance Value: 342 Ohm
Lead Channel Impedance Value: 361 Ohm
Lead Channel Impedance Value: 589 Ohm
Lead Channel Impedance Value: 646 Ohm
Lead Channel Impedance Value: 665 Ohm
Lead Channel Impedance Value: 684 Ohm
Lead Channel Impedance Value: 703 Ohm
Lead Channel Impedance Value: 722 Ohm
Lead Channel Pacing Threshold Amplitude: 1.5 V
Lead Channel Pacing Threshold Amplitude: 2.75 V
Lead Channel Pacing Threshold Pulse Width: 0.4 ms
Lead Channel Pacing Threshold Pulse Width: 1 ms
Lead Channel Setting Pacing Amplitude: 3 V
Lead Channel Setting Pacing Amplitude: 3.25 V
Lead Channel Setting Pacing Pulse Width: 0.8 ms
Lead Channel Setting Pacing Pulse Width: 1 ms
Lead Channel Setting Sensing Sensitivity: 5.6 mV

## 2019-05-16 MED ORDER — WARFARIN SODIUM 5 MG PO TABS: ORAL_TABLET | ORAL | 3 refills | Status: DC

## 2019-05-16 NOTE — Progress Notes (Signed)
HPI [name] [name] returns today for followup of her biv PPM. She is a pleasant 85 yo woman with a h/o valvular heart disease, CHB, atrial fib and flutter, s/p ICD insertion with recent down grade to a biv PPM. She has done well since her procedure but does not that she is more sedentary. She had not had chest pain or sob. Minimal edema. No palpitations.  Allergies  Allergen Reactions  . Penicillins Other (See Comments)    Bruise Did it involve swelling of the face/tongue/throat, SOB, or low BP? No Did it involve sudden or severe rash/hives, skin peeling, or any reaction on the inside of your mouth or nose? No Did you need to seek medical attention at a hospital or doctor's office? No When did it last happen?20+ years If all above answers are "NO", may proceed with cephalosporin use.   . Aspirin Other (See Comments)    On coumadin  . Fentanyl Dermatitis    Rash with patch   . Niacin Itching     Current Outpatient Medications  Medication Sig Dispense Refill  . acetaminophen (TYLENOL) 500 MG tablet Take 500 mg by mouth every 6 (six) hours as needed (pain).    Marland Kitchen albuterol (PROVENTIL HFA;VENTOLIN HFA) 108 (90 Base) MCG/ACT inhaler INHALE 2 PUFFS INTO THE LUNGS EVERY 6 HOURS AS NEEDED FOR WHEEZING OR SHORTNESS OF BREATH (Patient taking differently: Inhale 2 puffs into the lungs every 6 (six) hours as needed for wheezing or shortness of breath. ) 54 g 2  . albuterol (PROVENTIL) (2.5 MG/3ML) 0.083% nebulizer solution INHALE 1 VIAL VIA NEBULIZER THREE TIMES DAILY. (Patient taking differently: Take 2.5 mg by nebulization 2 (two) times daily. ) 90 vial 5  . allopurinol (ZYLOPRIM) 100 MG tablet TAKE 1 TABLET(100 MG) BY MOUTH DAILY 90 tablet 0  . benzonatate (TESSALON) 100 MG capsule Take 1 capsule (100 mg total) by mouth 2 (two) times daily as needed for cough. 20 capsule 1  . BREO ELLIPTA 100-25 MCG/INH AEPB TAKE 1 INHALATION BY MOUTH ONCE DAILY    . cholecalciferol (VITAMIN D3) 25  MCG (1000 UT) tablet Take 1,000 Units by mouth daily.    . diphenhydramine-acetaminophen (TYLENOL PM) 25-500 MG TABS tablet Take 1 tablet by mouth at bedtime as needed (sleep).    . docusate sodium (COLACE) 100 MG capsule Take 300 mg by mouth at bedtime as needed for mild constipation.     Marland Kitchen doxycycline (VIBRA-TABS) 100 MG tablet Take 1 tablet (100 mg total) by mouth 2 (two) times daily. 10 tablet 0  . fluticasone (FLONASE) 50 MCG/ACT nasal spray SHAKE LIQUID AND USE 2 SPRAYS IN EACH NOSTRIL DAILY (Patient taking differently: Place 2 sprays into both nostrils daily as needed for allergies. ) 48 g 1  . folic acid (FOLVITE) 1 MG tablet Take 1 tablet (1 mg total) by mouth daily. (Patient taking differently: Take 1 mg by mouth at bedtime. ) 100 tablet 5  . furosemide (LASIX) 40 MG tablet Take 1 tablet (40 mg total) by mouth daily. Skip dose today and start from 1/4 (Patient taking differently: Take 40 mg by mouth daily. ) 90 tablet 1  . gabapentin (NEURONTIN) 300 MG capsule TAKE 1 CAPSULE BY MOUTH AT BEDTIME 90 capsule 2  . hydrALAZINE (APRESOLINE) 25 MG tablet TAKE 1 TABLET THREE TIMES A DAY 270 tablet 0  . HYDROcodone-acetaminophen (NORCO) 7.5-325 MG tablet Take 1 tablet by mouth every 6 (six) hours as needed for moderate pain (Must last  30 days.). 60 tablet 0  . isosorbide mononitrate (IMDUR) 60 MG 24 hr tablet Take 1 tablet (60 mg total) by mouth daily. (Patient taking differently: Take 60 mg by mouth at bedtime. ) 90 tablet 2  . Liniments (SALONPAS PAIN RELIEF PATCH EX) Apply 1 patch topically daily as needed (pain).    . meclizine (ANTIVERT) 12.5 MG tablet Take 1 tablet (12.5 mg total) by mouth 3 (three) times daily as needed for dizziness. 30 tablet 2  . metolazone (ZAROXOLYN) 2.5 MG tablet Take 1 tablet (2.5 mg total) by mouth 2 (two) times a week. Wednesdays and Saturdays  Skip dose on 1/4 and start from 1/8 30 tablet 3  . metoprolol succinate (TOPROL-XL) 100 MG 24 hr tablet TAKE 1 TABLET BY  MOUTH DAILY. TAKE WITH OR IMMEDIATELY FOLLOWING A MEAL. 7 tablet 0  . Multiple Vitamin (MULTIVITAMIN) LIQD Take 5 mLs by mouth daily.    . ondansetron (ZOFRAN) 4 MG tablet Take 1 tablet (4 mg total) by mouth daily as needed for nausea or vomiting. 90 tablet 1  . Polyethyl [name] (SYSTANE) 0.4-0.3 % GEL ophthalmic gel Place 1 application into the left eye at bedtime.    . potassium chloride SA (K-DUR) 20 MEQ tablet TAKE 1 TABLET BY MOUTH DAILY (Patient taking differently: Take 20 mEq by mouth daily. ) 90 tablet 1  . pravastatin (PRAVACHOL) 40 MG tablet TAKE 1 TABLET EVERY EVENING 90 tablet 1  . SYNTHROID 75 MCG tablet TAKE 1 TABLET BY MOUTH DAILY 90 tablet 1  . warfarin (JANTOVEN) 5 MG tablet Take as directed by coumadin clinic 60 tablet 3   No current facility-administered medications for this visit.    Facility-Administered Medications Ordered in Other Visits  Medication Dose Route Frequency Provider Last Rate Last Dose  . epoetin alfa (EPOGEN,PROCRIT) injection 40,000 Units  40,000 Units Subcutaneous Once Farrel Gobble, MD         Past Medical History:  Diagnosis Date  . Adenomatous polyp of colon 12/16/2002   Dr. [name]/[facility]  . Allergy   . Cataract   . CHB (complete heart block) ([place]) 10/07/2014      . CHF (congestive heart failure) ([place])    a.  EF previously reduced to 25-30% b. EF improved to 60-65% by echo in 10/2017.   Marland Kitchen Chronic kidney disease, stage I 2006  . Constipation       . COPD (chronic obstructive pulmonary disease) ([place])       . DJD (degenerative joint disease) of lumbar spine   . DM (diabetes mellitus) ([place]) 2006  . Esophageal reflux   . Glaucoma   . Gout       . H/O heart valve replacement with mechanical valve    a. s/p mechanical AVR in 2001 and mechanical MVR in 2004.  Marland Kitchen H/O wisdom tooth extraction   . Hyperlipemia   . IDA (iron deficiency anemia)    Parenteral iron/[name]  . Insomnia        . Non-ischemic cardiomyopathy ([place])    a. s/p MDT CRTD  . Obesity, unspecified   . Other and unspecified hyperlipidemia   . Oxygen deficiency 2014   nocturnal;  . Spondylolisthesis   . Spondylosis   . Thyroid cancer ([place])    remote thyroidectomy, no recurrence, pt denies in 2016 thyroid cancer  . Unspecified hypothyroidism       . 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 Vertigo  ROS:   All systems reviewed and negative except as noted in the HPI.   Past Surgical History:  Procedure Laterality Date  . AORTIC AND MITRAL VALVE REPLACEMENT  2001  . BI-VENTRICULAR IMPLANTABLE CARDIOVERTER DEFIBRILLATOR UPGRADE N/A 01/18/2015   Procedure: BI-VENTRICULAR IMPLANTABLE CARDIOVERTER DEFIBRILLATOR UPGRADE;  Surgeon: Evans Lance, MD;  Location: [facility] CATH LAB;  Service: Cardiovascular;  Laterality: N/A;  . BIV ICD GENERATOR CHANGEOUT N/A 01/30/2019   Procedure: BIV ICD GENERATOR CHANGEOUT;  Surgeon: Evans Lance, MD;  Location: [place] [place] CV LAB;  Service: Cardiovascular;  Laterality: N/A;  . CARDIAC CATHETERIZATION    . CARDIAC VALVE REPLACEMENT    . CARDIOVERSION N/A 11/13/2016   Procedure: CARDIOVERSION;  Surgeon: Sanda Klein, MD;  Location: [facility] ENDOSCOPY;  Service: Cardiovascular;  Laterality: N/A;  . COLONOSCOPY  06/12/2007   Rourk- Normal rectum/Normal colon  . COLONOSCOPY  12/16/02   small hemorrhoids  . COLONOSCOPY  06/20/2012   Procedure: COLONOSCOPY;  Surgeon: Daneil Dolin, MD;  Location: [facility] ENDO SUITE;  Service: Endoscopy;  Laterality: N/A;  10:45  . defibrillator implanted 2006    . DOPPLER ECHOCARDIOGRAPHY  2012  . DOPPLER ECHOCARDIOGRAPHY  05,06,07,08,09,2011  . ESOPHAGOGASTRODUODENOSCOPY  06/12/2007   Rourk- normal esophagus, small hiatal hernia, otherwise normal stomach, D1, D2  . EYE SURGERY Right 2005  . EYE SURGERY Left 2006  . ICD LEAD REMOVAL Left 02/08/2015   Procedure: ICD LEAD REMOVAL;  Surgeon: Evans Lance, MD;   Location: [facility] OR;  Service: Cardiovascular;  Laterality: Left;  "Will plan extraction and insertion of a BiV PM"  **Dr. Roxan Hockey backing up case**  . IMPLANTABLE CARDIOVERTER DEFIBRILLATOR (ICD) GENERATOR CHANGE Left 02/08/2015   Procedure: ICD GENERATOR CHANGE;  Surgeon: Evans Lance, MD;  Location: [place];  Service: Cardiovascular;  Laterality: Left;  . INSERT / REPLACE / REMOVE PACEMAKER    . PACEMAKER INSERTION  May 2016  . pacemaker placed  2004  . right breast cyst removed     benign   . right cataract removed     2005  . TEE WITHOUT CARDIOVERSION N/A 11/13/2016   Procedure: TRANSESOPHAGEAL ECHOCARDIOGRAM (TEE);  Surgeon: Sanda Klein, MD;  Location: [facility] ENDOSCOPY;  Service: Cardiovascular;  Laterality: N/A;  . THYROIDECTOMY       Family History  Problem Relation Age of Onset  . Pancreatic cancer Mother   . Heart disease Father   . Heart disease Sister   . Heart attack Sister   . Heart disease Brother   . Diabetes Brother   . Heart disease Brother   . Diabetes Brother   . Hypertension Brother   . Lung cancer Brother      Social History   Socioeconomic History  . Marital status: Widowed    Spouse name: Not on file  . Number of children: 0  . Years of education: Not on file  . Highest education level: Not on file  Occupational History  . Occupation: retired    Fish farm manager: RETIRED  Social Needs  . Financial resource strain: Somewhat hard  . Food insecurity    Worry: Never true    Inability: Never true  . Transportation needs    Medical: No    Non-medical: No  Tobacco Use  . Smoking status: Former Smoker    Packs/day: 0.75    Years: 40.00    Pack years: 30.00    Types: Cigarettes    Quit date: 03/08/1987    Years since quitting: 32.2  .  Smokeless tobacco: Never Used  Substance and Sexual Activity  . Alcohol use: No    Alcohol/week: 0.0 standard drinks  . Drug use: No  . Sexual activity: Not Currently  Lifestyle  . Physical activity    Days per  week: 2 days    Minutes per session: 20 min  . Stress: Not at all  Relationships  . Social connections    Talks on phone: More than three times a week    Gets together: Never    Attends religious service: More than 4 times per year    Active member of club or organization: No    Attends meetings of clubs or organizations: Never    Relationship status: Widowed  . Intimate partner violence    Fear of current or ex partner: No    Emotionally abused: No    Physically abused: No    Forced sexual activity: No  Other Topics Concern  . Not on file  Social History Narrative   From [facility] (2004)     BP (!) 144/68   Pulse 63   Ht 5\' 7"  (1.702 m)   Wt 196 lb (88.9 kg)   SpO2 97%   BMI 30.70 kg/m   Physical Exam:  Well appearing NAD HEENT: Unremarkable Neck:  No JVD, no thyromegally Lymphatics:  No adenopathy Back:  No CVA tenderness Lungs:  Clear with no wheezes HEART:  Regular rate rhythm, no murmurs, no rubs, no clicks Abd:  soft, positive bowel sounds, no organomegally, no rebound, no guarding Ext:  2 plus pulses, no edema, no cyanosis, no clubbing Skin:  No rashes no nodules Neuro:  CN II through XII intact, motor grossly intact  EKG - atrial flutter with biv pacing  DEVICE  Normal device function.  See PaceArt for details.   Assess/Plan: 1. Atrial fib/flutter - she is asymptomatic and her VR is controlled. She will continue systemic anti-coagulation. She is not a good candidate for cardioversion or rhythm control. 2. Chronic systolic heart failure -she has had normalization of her LV function.  3. BIV PPM  - her medtronic biv PPM is working normally. No change in her outputs.   Mikle Bosworth.D.

## 2019-05-16 NOTE — Telephone Encounter (Signed)
° ° °*  STAT* If patient is at the pharmacy, call can be transferred to refill team.   1. Which medications need to be refilled? (please list name of each medication and dose if known)  isosorbide mononitrate (IMDUR) 60 MG 24 hr tablet furosemide (LASIX) 40 MG tablet metolazone (ZAROXOLYN) 2.5 MG tablet  2. Which pharmacy/location (including street and city if local pharmacy) is medication to be sent to? [place], [place] [phone]  3. Do they need a 30 day or 90 day supply? 90   Patient is using a new mail order pharmacy, and the pharmacy needs new prescriptions written to be able to fill and send to the patient.

## 2019-05-16 NOTE — Telephone Encounter (Signed)
° °  Pharmacy calling to request refill warfarin (JANTOVEN) 5 MG tablet    *STAT* If patient is at the pharmacy, call can be transferred to refill team.   1. Which medications need to be refilled? (please list name of each medication and dose if known) warfarin (JANTOVEN) 5 MG tablet  2. Which pharmacy/location (including street and city if local pharmacy) is medication to be sent to? [facility] 3. Do they need a 30 day or 90 day supply? [place]

## 2019-05-16 NOTE — Patient Instructions (Signed)
Your physician recommends that you continue on your current medications as directed. Please refer to the Current Medication list given to you today.   Your physician wants you to follow-up in: [place] will receive a reminder letter in the mail two months in advance. If you don't receive a letter, please call our office to schedule the follow-up appointment.

## 2019-05-18 ENCOUNTER — Encounter: Payer: Self-pay | Admitting: Family Medicine

## 2019-05-18 LAB — CUP PACEART REMOTE DEVICE CHECK
Battery Remaining Longevity: 65 mo
Battery Voltage: 3.06 V
Brady Statistic AP VP Percent: 0 %
Brady Statistic AP VS Percent: 0 %
Brady Statistic AS VP Percent: 99.75 %
Brady Statistic AS VS Percent: 0.25 %
Brady Statistic RA Percent Paced: 0 %
Brady Statistic RV Percent Paced: 99.75 %
Date Time Interrogation Session: 20200821052317
Implantable Lead Implant Date: 20040521
Implantable Lead Implant Date: 20071218
Implantable Lead Implant Date: 20160516
Implantable Lead Location: 753858
Implantable Lead Location: 753859
Implantable Lead Location: 753860
Implantable Lead Model: 5076
Implantable Lead Model: 6947
Implantable Pulse Generator Implant Date: 20200506
Lead Channel Impedance Value: 304 Ohm
Lead Channel Impedance Value: 304 Ohm
Lead Channel Impedance Value: 323 Ohm
Lead Channel Impedance Value: 361 Ohm
Lead Channel Impedance Value: 361 Ohm
Lead Channel Impedance Value: 380 Ohm
Lead Channel Impedance Value: 437 Ohm
Lead Channel Impedance Value: 437 Ohm
Lead Channel Impedance Value: 570 Ohm
Lead Channel Impedance Value: 627 Ohm
Lead Channel Impedance Value: 665 Ohm
Lead Channel Impedance Value: 722 Ohm
Lead Channel Impedance Value: 722 Ohm
Lead Channel Impedance Value: 779 Ohm
Lead Channel Pacing Threshold Amplitude: 1.625 V
Lead Channel Pacing Threshold Amplitude: 2.25 V
Lead Channel Pacing Threshold Pulse Width: 0.4 ms
Lead Channel Pacing Threshold Pulse Width: 1 ms
Lead Channel Setting Pacing Amplitude: 3 V
Lead Channel Setting Pacing Amplitude: 3.25 V
Lead Channel Setting Pacing Pulse Width: 0.8 ms
Lead Channel Setting Pacing Pulse Width: 1 ms
Lead Channel Setting Sensing Sensitivity: 5.6 mV

## 2019-05-18 NOTE — Assessment & Plan Note (Signed)
[name] [name] is reminded of the importance of commitment to daily physical activity for 30 minutes or more, as able and the need to limit carbohydrate intake to 30 to 60 grams per meal to help with blood sugar control.   The need to take medication as prescribed, test blood sugar as directed, and to call between visits if there is a concern that blood sugar is uncontrolled is also discussed.   [name] [name] is reminded of the importance of daily foot exam, annual eye examination, and good blood sugar, blood pressure and cholesterol control.  Diabetic Labs Latest Ref Rng & Units 04/10/2019 03/20/2019 02/13/2019 01/28/2019 11/20/2018  HbA1c <5.7 % of total Hgb - - - - 5.6  Microalbumin mg/dL - - - - -  Micro/Creat Ratio <30 mcg/mg creat - - - - -  Chol <200 mg/dL - - - - 136  HDL > OR = 50 mg/dL - - - - 44(L)  Calc LDL mg/dL (calc) - - - - 76  Triglycerides <150 mg/dL - - - - 81  Creatinine 0.44 - 1.00 mg/dL 1.64(H) 1.62(H) 1.50(H) 1.87(H) 1.66(H)   BP/Weight 05/16/2019 05/08/2019 04/28/2019 04/10/2019 03/25/2019 03/20/2019 9/67/2897  Systolic BP 915 041 364 383 779 396 886  Diastolic BP 68 73 76 45 62 54 61  Wt. (Lbs) 196 189 197 - 197.04 198 190  BMI 30.7 29.6 30.85 - 30.86 31.01 29.76   Foot/eye exam completion dates Latest Ref Rng & Units 07/17/2018 06/28/2017  Eye Exam No Retinopathy - -  Foot Form Completion - Done Done       Controlled, no change in medication

## 2019-05-18 NOTE — Assessment & Plan Note (Signed)
Controlled on current medication, no change in dose

## 2019-05-18 NOTE — Progress Notes (Signed)
[name] [name]     MRN: [id number]      DOB: 04/06/1932   HPI [name] [name] is here with a 3 day h/o cough, chest congestion with thick sputum and wheezing. Has no documented fever , but chills C/o increased urinary frequency, with mil dysuria Chronic nausea and lower extremitry pain and weakness. Poor exercise tolerance ROS  Denies sinus pressure, nasal congestion, ear pain or sore throat. . Denies chest pains, palpitations and leg swelling Denies abdominal pain, , vomiting,diarrhea or constipation.    C/o chronic  joint pain, swelling and limitation in mobility. Denies headaches, seizures, numbness, or tingling. Denies depression, anxiety or insomnia. Denies skin break down or rash.   PE  BP (!) 150/76   Pulse 86   Temp 98.5 F (36.9 C) (Temporal)   Resp 16   Ht 5\' 7"  (1.702 m)   Wt 197 lb (89.4 kg)   SpO2 94% Comment: 2 liters oxygen  BMI 30.85 kg/m   Patient alert and oriented and in mild cardiopulmonary distress.  HEENT: No facial asymmetry, EOMI,   oropharynx pink and moist.  Neck decreased ROM no JVD, no mass.  Chest: decreases air entry, scattered crackles and few wheezes  CVS: S1, S2 systolic  murmur, no S3.Regular rate.( pacemaker  Dependent)  ABD: Soft non tender.   Ext: No edema  FX:[id number] ROM spine, shoulders, hips and knees.  Skin: Intact, no ulcerations or rash noted.  Psych: Good eye contact, normal affect. Memory intact not anxious or depressed appearing.  CNS: CN 2-12 intact, power,  normal throughout.no focal deficits noted.   Assessment & Plan  Physical debility S/W consult for limited in home help to be arranged  Acute bronchitis with COPD ([place]) 3 day h/o cough, thick sputum and wheeze. Tessalon perle and antibiotic  Hematuria frequency with mild dysuria, urine to be sent for c/s , antibiotic also prescribed  Essential hypertension Elevated sBP at visit, pt has missed mormning diuretic, no med change   Persistent atrial  fibrillation (HCC) Permanent anticoagulation and pacemaker placed, followed by Cardiology  Hypothyroidism, postsurgical Controlled on current medication, no change in dose  Type 2 diabetes mellitus with diabetic nephropathy [facility]) [name] [name] is reminded of the importance of commitment to daily physical activity for 30 minutes or more, as able and the need to limit carbohydrate intake to 30 to 60 grams per meal to help with blood sugar control.   The need to take medication as prescribed, test blood sugar as directed, and to call between visits if there is a concern that blood sugar is uncontrolled is also discussed.   [name] [name] is reminded of the importance of daily foot exam, annual eye examination, and good blood sugar, blood pressure and cholesterol control.  Diabetic Labs Latest Ref Rng & Units 04/10/2019 03/20/2019 02/13/2019 01/28/2019 11/20/2018  HbA1c <5.7 % of total Hgb - - - - 5.6  Microalbumin mg/dL - - - - -  Micro/Creat Ratio <30 mcg/mg creat - - - - -  Chol <200 mg/dL - - - - 136  HDL > OR = 50 mg/dL - - - - 44(L)  Calc LDL mg/dL (calc) - - - - 76  Triglycerides <150 mg/dL - - - - 81  Creatinine 0.44 - 1.00 mg/dL 1.64(H) 1.62(H) 1.50(H) 1.87(H) 1.66(H)   BP/Weight 05/16/2019 05/08/2019 04/28/2019 04/10/2019 03/25/2019 03/20/2019 12/21/9240  Systolic BP 683 419 622 297 989 211 941  Diastolic BP 68 73 76 45 62 54 61  Wt. (Lbs)  196 189 197 - 197.04 198 190  BMI 30.7 29.6 30.85 - 30.86 31.01 29.76   Foot/eye exam completion dates Latest Ref Rng & Units 07/17/2018 06/28/2017  Eye Exam No Retinopathy - -  Foot Form Completion - Done Done       Controlled, no change in medication

## 2019-05-18 NOTE — Assessment & Plan Note (Signed)
Elevated sBP at visit, pt has missed mormning diuretic, no med change

## 2019-05-18 NOTE — Assessment & Plan Note (Signed)
Permanent anticoagulation and pacemaker placed, followed by Cardiology

## 2019-05-18 NOTE — Assessment & Plan Note (Signed)
frequency with mild dysuria, urine to be sent for c/s , antibiotic also prescribed

## 2019-05-19 ENCOUNTER — Other Ambulatory Visit: Payer: Self-pay

## 2019-05-19 ENCOUNTER — Ambulatory Visit (INDEPENDENT_AMBULATORY_CARE_PROVIDER_SITE_OTHER): Payer: Medicare Other | Admitting: Cardiovascular Disease

## 2019-05-19 ENCOUNTER — Ambulatory Visit (INDEPENDENT_AMBULATORY_CARE_PROVIDER_SITE_OTHER): Payer: Medicare Other

## 2019-05-19 VITALS — BP 106/61 | HR 64 | Ht 67.0 in | Wt 199.0 lb

## 2019-05-19 DIAGNOSIS — I4821 Permanent atrial fibrillation: Secondary | ICD-10-CM

## 2019-05-19 DIAGNOSIS — I5032 Chronic diastolic (congestive) heart failure: Secondary | ICD-10-CM | POA: Diagnosis not present

## 2019-05-19 DIAGNOSIS — I10 Essential (primary) hypertension: Secondary | ICD-10-CM | POA: Diagnosis not present

## 2019-05-19 DIAGNOSIS — N183 Chronic kidney disease, stage 3 unspecified: Secondary | ICD-10-CM

## 2019-05-19 DIAGNOSIS — I442 Atrioventricular block, complete: Secondary | ICD-10-CM | POA: Diagnosis not present

## 2019-05-19 DIAGNOSIS — I255 Ischemic cardiomyopathy: Secondary | ICD-10-CM

## 2019-05-19 DIAGNOSIS — D594 Other nonautoimmune hemolytic anemias: Secondary | ICD-10-CM

## 2019-05-19 DIAGNOSIS — Z952 Presence of prosthetic heart valve: Secondary | ICD-10-CM | POA: Diagnosis not present

## 2019-05-19 DIAGNOSIS — E669 Obesity, unspecified: Secondary | ICD-10-CM | POA: Diagnosis not present

## 2019-05-19 DIAGNOSIS — Z950 Presence of cardiac pacemaker: Secondary | ICD-10-CM | POA: Diagnosis not present

## 2019-05-19 DIAGNOSIS — Z23 Encounter for immunization: Secondary | ICD-10-CM | POA: Diagnosis not present

## 2019-05-19 MED ORDER — ISOSORBIDE MONONITRATE ER 30 MG PO TB24: 30.0000 mg | ORAL_TABLET | Freq: Every day | ORAL | 3 refills | Status: DC

## 2019-05-19 MED ORDER — HYDRALAZINE HCL 25 MG PO TABS: 25.0000 mg | ORAL_TABLET | Freq: Two times a day (BID) | ORAL | 3 refills | Status: DC

## 2019-05-19 NOTE — Progress Notes (Signed)
Cardiology Office Note    Date:  05/20/2019   ID:  [name] [name], DOB [birth year], MRN [id number]  PCP:  Fayrene Helper, MD  Cardiologist:  Cristopher Peru, M.D. (EP/ICD); Sanda Klein, MD   Chief Complaint  Patient presents with  . Congestive Heart Failure  . Pacemaker Check    History of Present Illness:  Chairty [name] is a 85 y.o. female returning in follow-up for valvular heart disease, persistent atrial fibrillation, complete heart block and chronic combined systolic and diastolic heart failure.  She seems to be doing a little better.  She has not required emergency room evaluation for heart failure since June and she has not required hospitalization since March.  She often will not use her oxygen when she is at home.  When she does use it she only runs it at 1 L/min.  She has not had problems with leg edema.  Her weight has decreased substantially since her hospitalization in March.  Today she weighs 2 pounds less compared to when I last saw her.  Her blood pressure today is much lower than in the past.  She has not had angina pectoris.  She has occasional dizziness with changes in position.  She denies palpitations.  She does not have orthopnea or PND.  When she was discharged from the hospital in March she weighed 213 pounds.  Today she weighs 199 pounds.  Her most recent creatinine from March 20, 2019 was 1.62.  She has not required recent diuretic dose adjustment.  Her thoracic impedance has been relatively steady with the exception of a small increase in late July.  On March 18, 2019 she had an echocardiogram that showed normal left ventricular ejection fraction of 55%.  Both prosthetic valves were functioning normally.  She had her biventricular pacemaker was implanted on Jan 30, 2019 by Dr. Lovena Le.  The device is programmed VVIR.  Biventricular pacemaker interrogation on August 21 shows normal device function.  She had 100% biventricular pacing, although effective pacing was  reported at 60.6%.  There have been no episodes of high ventricular response estimated pacemaker generator longevity is about 5-1/2 years.  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  She is on chronic warfarin anticoagulation and her most recent INR was 2.8 on July 13.  She is pacemaker dependent due to complete heart block following aortic and mitral valve replacement with mechanical prostheses. (AVR 2001, MVR 2004, both [employer] prostheses, surgeries performed in [place]). She had severe cardiomyopathy with an estimated left ventricular ejection fraction of 25%, but the most recent echo in March 18, 2019 showed improved LVEF to 55%.  She did not have any coronary artery disease by angiography before her first operation. In May 2016 her initial pacemaker lead was extracted and she received a CRT-D Medtronic Viva XT device by Dr. Cristopher Peru.  She has had problems with multifactorial anemia, including a component of prosthesis related hemolytic anemia, baseline hemoglobin around 10.   Past Medical History:  Diagnosis Date  . Adenomatous polyp of colon 12/16/2002   Dr. [name]/[facility]  . Allergy   . Cataract   . CHB (complete heart block) ([place]) 10/07/2014      . CHF (congestive heart failure) ([place])    a.  EF previously reduced to 25-30% b. EF improved to 60-65% by echo in 10/2017.   [name] [name] Chronic kidney disease, stage I  2006  . Constipation       . COPD (chronic obstructive pulmonary disease) ([place])       . DJD (degenerative joint disease) of lumbar spine   . DM (diabetes mellitus) ([place]) 2006  . Esophageal reflux   . Glaucoma   . Gout       . H/O heart valve replacement with mechanical valve    a. s/p mechanical AVR in 2001 and mechanical MVR in 2004.  [name] [name] H/O wisdom tooth extraction   . Hyperlipemia   . IDA (iron deficiency anemia)    Parenteral iron/[name]  .  Insomnia       . Non-ischemic cardiomyopathy ([place])    a. s/p MDT CRTD  . Obesity, unspecified   . Other and unspecified hyperlipidemia   . Oxygen deficiency 2014   nocturnal;  . Spondylolisthesis   . Spondylosis   . Thyroid cancer ([place])    remote thyroidectomy, no recurrence, pt denies in 2016 thyroid cancer  . Type 2 diabetes mellitus with hyperglycemia ([place]) 07/15/2010  . Unspecified hypothyroidism       . 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 Vertigo         Past Surgical History:  Procedure Laterality Date  . AORTIC AND MITRAL VALVE REPLACEMENT  2001  . BI-VENTRICULAR IMPLANTABLE CARDIOVERTER DEFIBRILLATOR UPGRADE N/A 01/18/2015   Procedure: BI-VENTRICULAR IMPLANTABLE CARDIOVERTER DEFIBRILLATOR UPGRADE;  Surgeon: Evans Lance, MD;  Location: [facility] CATH LAB;  Service: Cardiovascular;  Laterality: N/A;  . BIV ICD GENERATOR CHANGEOUT N/A 01/30/2019   Procedure: BIV ICD GENERATOR CHANGEOUT;  Surgeon: Evans Lance, MD;  Location: [place] CV LAB;  Service: Cardiovascular;  Laterality: N/A;  . CARDIAC CATHETERIZATION    . CARDIAC VALVE REPLACEMENT    . CARDIOVERSION N/A 11/13/2016   Procedure: CARDIOVERSION;  Surgeon: Sanda Klein, MD;  Location: [facility] ENDOSCOPY;  Service: Cardiovascular;  Laterality: N/A;  . COLONOSCOPY  06/12/2007   Rourk- Normal rectum/Normal colon  . COLONOSCOPY  12/16/02   small hemorrhoids  . COLONOSCOPY  06/20/2012   Procedure: COLONOSCOPY;  Surgeon: Daneil Dolin, MD;  Location: [facility] ENDO SU[facility];  Service: Endoscopy;  Laterality: N/A;  10:45  . defibrillator implanted 2006    . DOPPLER ECHOCARDIOGRAPHY  2012  . DOPPLER ECHOCARDIOGRAPHY  05,06,07,08,09,2011  . ESOPHAGOGASTRODUODENOSCOPY  06/12/2007   Rourk- normal esophagus, small hiatal hernia, otherwise normal stomach, D1, D2  . EYE SURGERY Right 2005  . EYE SURGERY Left 2006  . ICD LEAD REMOVAL Left 02/08/2015   Procedure: ICD LEAD REMOVAL;  Surgeon: Evans Lance, MD;   Location: [facility] OR;  Service: Cardiovascular;  Laterality: Left;  "Will plan extraction and insertion of a BiV PM"  **Dr. Roxan Hockey backing up case**  . IMPLANTABLE CARDIOVERTER DEFIBRILLATOR (ICD) GENERATOR CHANGE Left 02/08/2015   Procedure: ICD GENERATOR CHANGE;  Surgeon: Evans Lance, MD;  Location: [place];  Service: Cardiovascular;  Laterality: Left;  . INSERT / REPLACE / REMOVE PACEMAKER    . PACEMAKER INSERTION  May 2016  . pacemaker placed  2004  . right breast cyst removed     benign   . right cataract removed     2005  . TEE WITHOUT CARDIOVERSION N/A 11/13/2016   Procedure: TRANSESOPHAGEAL ECHOCARDIOGRAM (TEE);  Surgeon: Sanda Klein, MD;  Location: [facility] ENDOSCOPY;  Service: Cardiovascular;  Laterality: N/A;  . THYROIDECTOMY      Current Medications: Outpatient Medications Prior to Visit  Medication Sig Dispense Refill  .  acetaminophen (TYLENOL) 500 MG tablet Take 500 mg by mouth every 6 (six) hours as needed (pain).    [name] [name] albuterol (PROVENTIL HFA;VENTOLIN HFA) 108 (90 Base) MCG/ACT inhaler INHALE 2 PUFFS INTO THE LUNGS EVERY 6 HOURS AS NEEDED FOR WHEEZING OR SHORTNESS OF BREATH (Patient taking differently: Inhale 2 puffs into the lungs every 6 (six) hours as needed for wheezing or shortness of breath. ) 54 g 2  . albuterol (PROVENTIL) (2.5 MG/3ML) 0.083% nebulizer solution INHALE 1 VIAL VIA NEBULIZER THREE TIMES DAILY. (Patient taking differently: Take 2.5 mg by nebulization 2 (two) times daily. ) 90 vial 5  . allopurinol (ZYLOPRIM) 100 MG tablet TAKE 1 TABLET(100 MG) BY MOUTH DAILY 90 tablet 0  . benzonatate (TESSALON) 100 MG capsule Take 1 capsule (100 mg total) by mouth 2 (two) times daily as needed for cough. 20 capsule 1  . cholecalciferol (VITAMIN D3) 25 MCG (1000 UT) tablet Take 1,000 Units by mouth daily.    . diphenhydramine-acetaminophen (TYLENOL PM) 25-500 MG TABS tablet Take 1 tablet by mouth at bedtime as needed (sleep).    . docusate sodium (COLACE) 100 MG capsule  Take 300 mg by mouth at bedtime as needed for mild constipation.     . fluticasone (FLONASE) 50 MCG/ACT nasal spray SHAKE LIQUID AND USE 2 SPRAYS IN EACH NOSTRIL DAILY (Patient taking differently: Place 2 sprays into both nostrils daily as needed for allergies. ) 48 g 1  . folic acid (FOLVITE) 1 MG tablet Take 1 tablet (1 mg total) by mouth daily. (Patient taking differently: Take 1 mg by mouth at bedtime. ) 100 tablet 5  . furosemide (LASIX) 40 MG tablet Take 1 tablet (40 mg total) by mouth daily. Skip dose today and start from 1/4 (Patient taking differently: Take 40 mg by mouth daily. ) 90 tablet 1  . gabapentin (NEURONTIN) 300 MG capsule TAKE 1 CAPSULE BY MOUTH AT BEDTIME 90 capsule 2  . HYDROcodone-acetaminophen (NORCO) 7.5-325 MG tablet Take 1 tablet by mouth every 6 (six) hours as needed for moderate pain (Must last 30 days.). 60 tablet 0  . Liniments (SALONPAS PAIN RELIEF PATCH EX) Apply 1 patch topically daily as needed (pain).    . meclizine (ANTIVERT) 12.5 MG tablet Take 1 tablet (12.5 mg total) by mouth 3 (three) times daily as needed for dizziness. 30 tablet 2  . metolazone (ZAROXOLYN) 2.5 MG tablet Take 1 tablet (2.5 mg total) by mouth 2 (two) times a week. Wednesdays and Saturdays  Skip dose on 1/4 and start from 1/8 30 tablet 3  . metoprolol succinate (TOPROL-XL) 100 MG 24 hr tablet TAKE 1 TABLET BY MOUTH DAILY. TAKE WITH OR IMMEDIATELY FOLLOWING A MEAL. 7 tablet 0  . Multiple Vitamin (MULTIVITAMIN) LIQD Take 5 mLs by mouth daily.    . ondansetron (ZOFRAN) 4 MG tablet Take 1 tablet (4 mg total) by mouth daily as needed for nausea or vomiting. 90 tablet 1  . Polyethyl [name] (SYSTANE) 0.4-0.3 % GEL ophthalmic gel Place 1 application into the left eye at bedtime.    . potassium chloride SA (K-DUR) 20 MEQ tablet TAKE 1 TABLET BY MOUTH DAILY (Patient taking differently: Take 20 mEq by mouth daily. ) 90 tablet 1  . SYNTHROID 75 MCG tablet TAKE 1 TABLET BY MOUTH DAILY 90  tablet 1  . warfarin (JANTOVEN) 5 MG tablet Take as directed by coumadin clinic 60 tablet 3  . BREO ELLIPTA 100-25 MCG/INH AEPB TAKE 1 INHALATION BY MOUTH ONCE DAILY    .  doxycycline (VIBRA-TABS) 100 MG tablet Take 1 tablet (100 mg total) by mouth 2 (two) times daily. 10 tablet 0  . hydrALAZINE (APRESOLINE) 25 MG tablet TAKE 1 TABLET THREE TIMES A DAY 270 tablet 0  . isosorbide mononitrate (IMDUR) 60 MG 24 hr tablet Take 1 tablet (60 mg total) by mouth daily. (Patient taking differently: Take 60 mg by mouth at bedtime. ) 90 tablet 2  . pravastatin (PRAVACHOL) 40 MG tablet TAKE 1 TABLET EVERY EVENING 90 tablet 1   Facility-Administered Medications Prior to Visit  Medication Dose Route Frequency Provider Last Rate Last Dose  . epoetin alfa (EPOGEN,PROCRIT) injection 40,000 Units  40,000 Units Subcutaneous Once Farrel Gobble, MD         Allergies:   Penicillins, Aspirin, Fentanyl, and Niacin   Social History   Socioeconomic History  . Marital status: Widowed    Spouse name: Not on file  . Number of children: 0  . Years of education: Not on file  . Highest education level: Not on file  Occupational History  . Occupation: retired    Fish farm manager: RETIRED  Social Needs  . Financial resource strain: Somewhat hard  . Food insecurity    Worry: Never true    Inability: Never true  . Transportation needs    Medical: No    Non-medical: No  Tobacco Use  . Smoking status: Former Smoker    Packs/day: 0.75    Years: 40.00    Pack years: 30.00    Types: Cigarettes    Quit date: 03/08/1987    Years since quitting: 32.2  . Smokeless tobacco: Never Used  Substance and Sexual Activity  . Alcohol use: No    Alcohol/week: 0.0 standard drinks  . Drug use: No  . Sexual activity: Not Currently  Lifestyle  . Physical activity    Days per week: 2 days    Minutes per session: 20 min  . Stress: Not at all  Relationships  . Social connections    Talks on phone: More than three times a week     Gets together: Never    Attends religious service: More than 4 times per year    Active member of club or organization: No    Attends meetings of clubs or organizations: Never    Relationship status: Widowed  Other Topics Concern  . Not on file  Social History Narrative   From [place] (2004)     Family History:  The patient's family history includes Diabetes in her brother and brother; Heart attack in her sister; Heart disease in her brother, brother, father, and sister; Hypertension in her brother; Lung cancer in her brother; Pancreatic cancer in her mother.   ROS:   Please see the history of present illness.    ROS All other systems reviewed and are negative.   PHYSICAL EXAM:   VS:  BP 106/61   Pulse 64   Ht 5\' 7"  (1.702 m)   Wt 199 lb (90.3 kg)   SpO2 99%   BMI 31.17 kg/m      General: Alert, oriented x3, no distress, appears well Head: no evidence of trauma, PERRL, EOMI, no exophtalmos or lid lag, no myxedema, no xanthelasma; normal ears, nose and oropharynx Neck: normal jugular venous pulsations and no hepatojugular reflux; brisk carotid pulses without delay and no carotid bruits Chest: clear to auscultation, no signs of consolidation by percussion or palpation, normal fremitus, symmetrical and full respiratory excursions Cardiovascular: normal position and quality of the apical  impulse, regular rhythm, crisp prosthetic valve clicks.  1/6 early peaking aortic ejection murmur.  There are no apical murmurs and no diastolic murmurs, no rubs or gallops. Abdomen: no tenderness or distention, no masses by palpation, no abnormal pulsatility or arterial bruits, normal bowel sounds, no hepatosplenomegaly Extremities: no clubbing, cyanosis or edema; 2+ radial, ulnar and brachial pulses bilaterally; 2+ right femoral, posterior tibial and dorsalis pedis pulses; 2+ left femoral, posterior tibial and dorsalis pedis pulses; no subclavian or femoral bruits Neurological: grossly nonfocal  Psych: Normal mood and affect   Wt Readings from Last 3 Encounters:  05/20/19 199 lb (90.3 kg)  05/19/19 199 lb (90.3 kg)  05/16/19 196 lb (88.9 kg)      Studies/Labs Reviewed:   EKG:  EKG is not ordered today.  ECG from May 16, 2019 is reviewed and it shows background atrial flutter with complete heart block and biventricular pacing Recent Labs: 09/30/2018: Magnesium 2.0 11/20/2018: TSH 0.72 03/20/2019: B Natriuretic Peptide 497.0 04/10/2019: ALT 13; BUN 47; Creatinine, Ser 1.64; Potassium 3.8; Sodium 135 05/08/2019: Hemoglobin 10.3; Platelets 180   Lipid Panel    Component Value Date/Time   CHOL 136 11/20/2018 1139   CHOL 165 10/16/2016 1213   TRIG 81 11/20/2018 1139   HDL 44 (L) 11/20/2018 1139   HDL 31 (L) 10/16/2016 1213   CHOLHDL 3.1 11/20/2018 1139   VLDL 52 (H) 02/21/2017 1010   LDLCALC 76 11/20/2018 1139     ASSESSMENT:    1. Chronic diastolic heart failure ([place])   2. Permanent atrial fibrillation   3. CHB (complete heart block) (HCC)   4. Biventricular cardiac pacemaker in situ   5. H/O mitral valve replacement with mechanical valve   6. Hx of aortic valve replacement, mechanical   7. Other non-autoimmune hemolytic anemias ([place])   8. Essential hypertension   9. CKD (chronic kidney disease) stage 3, GFR 30-59 ml/min (HCC)   10. Mild obesity      PLAN:  In order of problems listed above:  1. CHF: She appears to be clinically euvolemic and this is confirmed by stable thoracic impedance readings throughout the last 30 days.  She continues to take metolazone on a twice weekly schedule in addition to daily loop diuretic.  Normal EF by echo June 2020.  On hydralazine/nitrates and metoprolol. 2. AFib: Permanent arrhythmia.  Most recent ECG looks like atypical atrial flutter, possibly related to scar of previous left atriotomy. 3. CHB: Pacemaker dependent. 4. CRT-P: normal device function.  Excellent response to resynchronization therapy.  Recent device check on  August 21 reviewed.  Followed by Dr. Lovena Le. 5. Mechanical mitral valve prosthesis: Echo June 2020 shows unchanged mean gradient of 5 mmHg, improved pressure half-time 66 ms 6. Mechanical aortic valve prosthesis: Echo June 2020 shows stable mean gradient 12 mmHg and dimensionless index 0.3 7. Prosthetic valve associated hemolysis: Hemoglobin slightly improved at 10.3, usually in the 9-10 range. 8. HTN: Blood pressure range 9. CKD 3: Creatinine baseline appears to be 1.6, corresponding to a GFR approximately 33. 10. Obesity: She said that he lost weight over the years but remains mildly obese.    Medication Adjustments/Labs and Tests Ordered: Current medicines are reviewed at length with the patient today.  Concerns regarding medicines are outlined above.  Medication changes, Labs and Tests ordered today are listed in the Patient Instructions below. Patient Instructions  Medication Instructions:  DECREASE the Isosorbide to 30 mg once daily DECREASE the Hydralazine to 25 mg twice daily  If  you need a refill on your cardiac medications before your next appointment, please call your pharmacy.   Lab work: None ordered If you have labs (blood work) drawn today and your tests are completely normal, you will receive your results only by: [name] [name] MyChart Message (if you have MyChart) OR . A paper copy in the mail If you have any lab test that is abnormal or we need to change your treatment, we will call you to review the results.  Testing/Procedures: None ordered  Follow-Up: At [facility], you and your health needs are our priority.  As part of our continuing mission to provide you with exceptional heart care, we have created designated Provider Care Teams.  These Care Teams include your primary Cardiologist (physician) and Advanced Practice Providers (APPs -  Physician Assistants and Nurse Practitioners) who all work together to provide you with the care you need, when you need it. You will need  a follow up appointment in 6 months.  Please call our office 2 months in advance to schedule this appointment.  You may see Sanda Klein, MD or one of the following Advanced Practice Providers on your designated Care Team: [place], [place] . Fabian Sharp, PA-C      Signed, Sanda Klein, MD  05/20/2019 5:25 PM    [facility] [address], [place], [place]  [address] Phone: [phone]; Fax: [phone]

## 2019-05-19 NOTE — Patient Instructions (Signed)
Medication Instructions:  DECREASE the Isosorbide to 30 mg once daily DECREASE the Hydralazine to 25 mg twice daily  If you need a refill on your cardiac medications before your next appointment, please call your pharmacy.   Lab work: None ordered If you have labs (blood work) drawn today and your tests are completely normal, you will receive your results only by: Marland Kitchen MyChart Message (if you have MyChart) OR . A paper copy in the mail If you have any lab test that is abnormal or we need to change your treatment, we will call you to review the results.  Testing/Procedures: None ordered  Follow-Up: At [facility], you and your health needs are our priority.  As part of our continuing mission to provide you with exceptional heart care, we have created designated Provider Care Teams.  These Care Teams include your primary Cardiologist (physician) and Advanced Practice Providers (APPs -  Physician Assistants and Nurse Practitioners) who all work together to provide you with the care you need, when you need it. You will need a follow up appointment in 6 months.  Please call our office 2 months in advance to schedule this appointment.  You may see Sanda Klein, MD or one of the following Advanced Practice Providers on your designated Care Team: [place], [place] . Fabian Sharp, PA-C

## 2019-05-20 ENCOUNTER — Encounter: Payer: Self-pay | Admitting: Family Medicine

## 2019-05-20 ENCOUNTER — Encounter: Payer: Self-pay | Admitting: Cardiovascular Disease

## 2019-05-20 ENCOUNTER — Ambulatory Visit (INDEPENDENT_AMBULATORY_CARE_PROVIDER_SITE_OTHER): Payer: Medicare Other | Admitting: Family Medicine

## 2019-05-20 VITALS — BP 144/72 | HR 64 | Resp 14 | Ht 69.0 in | Wt 199.0 lb

## 2019-05-20 DIAGNOSIS — I10 Essential (primary) hypertension: Secondary | ICD-10-CM

## 2019-05-20 DIAGNOSIS — Z950 Presence of cardiac pacemaker: Secondary | ICD-10-CM

## 2019-05-20 DIAGNOSIS — E890 Postprocedural hypothyroidism: Secondary | ICD-10-CM

## 2019-05-20 DIAGNOSIS — Z95810 Presence of automatic (implantable) cardiac defibrillator: Secondary | ICD-10-CM

## 2019-05-20 DIAGNOSIS — N184 Chronic kidney disease, stage 4 (severe): Secondary | ICD-10-CM | POA: Diagnosis not present

## 2019-05-20 DIAGNOSIS — J209 Acute bronchitis, unspecified: Secondary | ICD-10-CM | POA: Diagnosis not present

## 2019-05-20 DIAGNOSIS — J440 Chronic obstructive pulmonary disease with acute lower respiratory infection: Secondary | ICD-10-CM | POA: Diagnosis not present

## 2019-05-20 DIAGNOSIS — E785 Hyperlipidemia, unspecified: Secondary | ICD-10-CM | POA: Diagnosis not present

## 2019-05-20 DIAGNOSIS — M1711 Unilateral primary osteoarthritis, right knee: Secondary | ICD-10-CM | POA: Diagnosis not present

## 2019-05-20 DIAGNOSIS — I255 Ischemic cardiomyopathy: Secondary | ICD-10-CM | POA: Diagnosis not present

## 2019-05-20 DIAGNOSIS — E1122 Type 2 diabetes mellitus with diabetic chronic kidney disease: Secondary | ICD-10-CM | POA: Diagnosis not present

## 2019-05-20 DIAGNOSIS — I8393 Asymptomatic varicose veins of bilateral lower extremities: Secondary | ICD-10-CM | POA: Diagnosis not present

## 2019-05-20 DIAGNOSIS — R262 Difficulty in walking, not elsewhere classified: Secondary | ICD-10-CM | POA: Diagnosis not present

## 2019-05-20 DIAGNOSIS — I442 Atrioventricular block, complete: Secondary | ICD-10-CM | POA: Diagnosis not present

## 2019-05-20 DIAGNOSIS — I5032 Chronic diastolic (congestive) heart failure: Secondary | ICD-10-CM

## 2019-05-20 DIAGNOSIS — I130 Hypertensive heart and chronic kidney disease with heart failure and stage 1 through stage 4 chronic kidney disease, or unspecified chronic kidney disease: Secondary | ICD-10-CM | POA: Diagnosis not present

## 2019-05-20 DIAGNOSIS — M6281 Muscle weakness (generalized): Secondary | ICD-10-CM

## 2019-05-20 DIAGNOSIS — Z9981 Dependence on supplemental oxygen: Secondary | ICD-10-CM

## 2019-05-20 DIAGNOSIS — Z7901 Long term (current) use of anticoagulants: Secondary | ICD-10-CM | POA: Diagnosis not present

## 2019-05-20 DIAGNOSIS — F329 Major depressive disorder, single episode, unspecified: Secondary | ICD-10-CM | POA: Diagnosis not present

## 2019-05-20 MED ORDER — PRAVASTATIN SODIUM 40 MG PO TABS: 40.0000 mg | ORAL_TABLET | Freq: Every evening | ORAL | 3 refills | Status: DC

## 2019-05-20 MED ORDER — BREO ELLIPTA 100-25 MCG/INH IN AEPB: INHALATION_SPRAY | RESPIRATORY_TRACT | 4 refills | Status: DC

## 2019-05-20 NOTE — Patient Instructions (Addendum)
F/U with MD in January, call if you need me before  Fasting lipid, cmp and eGFR, and tSH in sept,mber , 2nd week  Continue all medications as you are currently taking  Statin and breo will be refilled by nurse today, pt will contact office to advise of  new pharmacy supplier, states this will be changed  Careful not to fall , use cane and walker when needed , and no clutter in home  Thanks for choosing [facility], we consider it a privelige to serve you.

## 2019-05-20 NOTE — Progress Notes (Signed)
Virtual Visit via Telephone Note  I connected with [name] [name] on 05/20/19 at  1:20 PM EDT by telephone and verified that I am speaking with the correct person using two identifiers. This visit type is conducted due to national recommendations for restrictions regarding the COVID -19 Pandemic. Due to the patient's age and / or co morbidities, this format is felt to be most appropriate at this time without adequate follow up. The patient has no access to video technology/ had technical difficulties with video, requiring transitioning to audio format  only ( telephone ). All issues noted this document were discussed and addressed,no physical exam can be performed in this format.   Location: Patient: home Provider: office   I discussed the limitations, risks, security and privacy concerns of performing an evaluation and management service by telephone and the availability of in person appointments. I also discussed with the patient that there may be a patient responsible charge related to this service. The patient expressed understanding and agreed to proceed.   History of Present Illness: F/u chronic problems, review recent consultations and lab review Denies recent fever or chills. Denies sinus pressure, nasal congestion, ear pain or sore throat. Denies chest congestion, productive cough or wheezing. Denies chest pains, palpitations and leg swelling Denies abdominal pain, vomiting,diarrhea or constipation.  Chronic nausea Denies dysuria, frequency, hesitancy or incontinence. C/o chronic  joint pain, swelling and limitation in mobility. Denies headaches, seizures, numbness, or tingling. Denies depression, anxiety or insomnia. Denies skin break down or rash.       Observations/Objective: BP (!) 144/72   Pulse 64   Resp 14   Ht 5\' 9"  (1.753 m)   Wt 199 lb (90.3 kg)   BMI 29.39 kg/m  Good communication with no confusion and intact memory. Alert and oriented x 3 No signs of  respiratory distress during speech    Assessment and Plan:  Chronic diastolic heart failure (HCC) Currently stable , no symptoms of decompensation at this time  Essential hypertension Adequate though sub optimal control, no change in meds  Hyperlipidemia LDL goal <70 Hyperlipidemia:Low fat diet discussed and encouraged.   Lipid Panel  Lab Results  Component Value Date   CHOL 136 11/20/2018   HDL 44 (L) 11/20/2018   LDLCALC 76 11/20/2018   TRIG 81 11/20/2018   CHOLHDL 3.1 11/20/2018     Controlled, no change in medication   Hypothyroidism, postsurgical Controlled, no change in medication    Follow Up Instructions:    I discussed the assessment and treatment plan with the patient. The patient was provided an opportunity to ask questions and all were answered. The patient agreed with the plan and demonstrated an understanding of the instructions.   The patient was advised to call back or seek an in-person evaluation if the symptoms worsen or if the condition fails to improve as anticipated.  I provided 22 minutes of non-face-to-face time during this encounter.   [name], MD

## 2019-05-21 ENCOUNTER — Other Ambulatory Visit: Payer: Self-pay

## 2019-05-21 ENCOUNTER — Ambulatory Visit (INDEPENDENT_AMBULATORY_CARE_PROVIDER_SITE_OTHER): Payer: Medicare Other | Admitting: *Deleted

## 2019-05-21 DIAGNOSIS — Z5181 Encounter for therapeutic drug level monitoring: Secondary | ICD-10-CM | POA: Diagnosis not present

## 2019-05-21 DIAGNOSIS — Z952 Presence of prosthetic heart valve: Secondary | ICD-10-CM | POA: Diagnosis not present

## 2019-05-21 LAB — POCT INR: INR: 2.2 (ref 2.0–3.0)

## 2019-05-21 NOTE — Patient Instructions (Signed)
Continue coumadin 2 tablets daily except 1 tablet on Saturdays.  Continue greens  Recheck in 6 weeks.  

## 2019-05-22 ENCOUNTER — Other Ambulatory Visit: Payer: Self-pay | Admitting: Cardiovascular Disease

## 2019-05-22 DIAGNOSIS — J209 Acute bronchitis, unspecified: Secondary | ICD-10-CM | POA: Diagnosis not present

## 2019-05-22 DIAGNOSIS — J440 Chronic obstructive pulmonary disease with acute lower respiratory infection: Secondary | ICD-10-CM | POA: Diagnosis not present

## 2019-05-22 DIAGNOSIS — R262 Difficulty in walking, not elsewhere classified: Secondary | ICD-10-CM | POA: Diagnosis not present

## 2019-05-22 DIAGNOSIS — M1711 Unilateral primary osteoarthritis, right knee: Secondary | ICD-10-CM | POA: Diagnosis not present

## 2019-05-22 DIAGNOSIS — I130 Hypertensive heart and chronic kidney disease with heart failure and stage 1 through stage 4 chronic kidney disease, or unspecified chronic kidney disease: Secondary | ICD-10-CM | POA: Diagnosis not present

## 2019-05-22 DIAGNOSIS — E1122 Type 2 diabetes mellitus with diabetic chronic kidney disease: Secondary | ICD-10-CM | POA: Diagnosis not present

## 2019-05-22 NOTE — Telephone Encounter (Signed)
 *  STAT* If patient is at the pharmacy, call can be transferred to refill team.   1. Which medications need to be refilled? (please list name of each medication and dose if known)   furosemide (LASIX) 40 MG tablet isosorbide mononitrate (IMDUR) 15 mg TB24 24 hr tablet metolazone (ZAROXOLYN) 2.5 MG tablet  2. Which pharmacy/location (including street and city if local pharmacy) is medication to be sent to? [place] fax [phone]  3. Do they need a 30 day or 90 day supply? [place]

## 2019-05-23 ENCOUNTER — Encounter: Payer: Self-pay | Admitting: Cardiology

## 2019-05-23 DIAGNOSIS — E1122 Type 2 diabetes mellitus with diabetic chronic kidney disease: Secondary | ICD-10-CM | POA: Diagnosis not present

## 2019-05-23 DIAGNOSIS — J209 Acute bronchitis, unspecified: Secondary | ICD-10-CM | POA: Diagnosis not present

## 2019-05-23 DIAGNOSIS — R262 Difficulty in walking, not elsewhere classified: Secondary | ICD-10-CM | POA: Diagnosis not present

## 2019-05-23 DIAGNOSIS — M1711 Unilateral primary osteoarthritis, right knee: Secondary | ICD-10-CM | POA: Diagnosis not present

## 2019-05-23 DIAGNOSIS — I130 Hypertensive heart and chronic kidney disease with heart failure and stage 1 through stage 4 chronic kidney disease, or unspecified chronic kidney disease: Secondary | ICD-10-CM | POA: Diagnosis not present

## 2019-05-23 DIAGNOSIS — J440 Chronic obstructive pulmonary disease with acute lower respiratory infection: Secondary | ICD-10-CM | POA: Diagnosis not present

## 2019-05-23 MED ORDER — METOLAZONE 2.5 MG PO TABS: 2.5000 mg | ORAL_TABLET | ORAL | 6 refills | Status: DC

## 2019-05-23 MED ORDER — ISOSORBIDE MONONITRATE ER 30 MG PO TB24: 30.0000 mg | ORAL_TABLET | Freq: Every day | ORAL | 6 refills | Status: DC

## 2019-05-23 MED ORDER — FUROSEMIDE 40 MG PO TABS: 40.0000 mg | ORAL_TABLET | Freq: Every day | ORAL | 6 refills | Status: DC

## 2019-05-23 NOTE — Progress Notes (Signed)
Remote pacemaker transmission.   

## 2019-05-23 NOTE — Telephone Encounter (Signed)
Rx request sent to [facility].

## 2019-05-23 NOTE — Addendum Note (Signed)
Addended by: Tiajuana Amass on: 05/23/2019 11:35 AM   Modules accepted: Level of Service

## 2019-05-24 NOTE — Assessment & Plan Note (Signed)
Controlled, no change in medication  

## 2019-05-24 NOTE — Assessment & Plan Note (Signed)
Hyperlipidemia:Low fat diet discussed and encouraged.   Lipid Panel  Lab Results  Component Value Date   CHOL 136 11/20/2018   HDL 44 (L) 11/20/2018   LDLCALC 76 11/20/2018   TRIG 81 11/20/2018   CHOLHDL 3.1 11/20/2018   Controlled, no change in medication    

## 2019-05-24 NOTE — Assessment & Plan Note (Signed)
Adequate though sub optimal control, no change in meds

## 2019-05-24 NOTE — Assessment & Plan Note (Signed)
Currently stable , no symptoms of decompensation at this time

## 2019-05-27 ENCOUNTER — Telehealth: Payer: Self-pay | Admitting: Family Medicine

## 2019-05-27 NOTE — Telephone Encounter (Signed)
Please schedule this patient with Dr.Simpson to discuss Gabapentin (medication management). Thank you

## 2019-05-27 NOTE — Telephone Encounter (Signed)
Pt is calling she states that 1 Gabapentin is not working and wants to know if she can take 2

## 2019-05-27 NOTE — Telephone Encounter (Signed)
Please advise 

## 2019-05-28 NOTE — Telephone Encounter (Signed)
Pt does not want to come in she was just seen 8-25 said she cannot make it back in the office

## 2019-05-29 ENCOUNTER — Inpatient Hospital Stay (HOSPITAL_COMMUNITY)
Admission: EM | Admit: 2019-05-29 | Discharge: 2019-05-31 | DRG: 291 | Disposition: A | Discharge status: Home Health | Payer: Medicare Other | Attending: Internal Medicine | Admitting: Internal Medicine

## 2019-05-29 ENCOUNTER — Emergency Department (HOSPITAL_COMMUNITY): Payer: Medicare Other

## 2019-05-29 ENCOUNTER — Encounter (HOSPITAL_COMMUNITY): Payer: Self-pay | Admitting: Emergency Medicine

## 2019-05-29 ENCOUNTER — Other Ambulatory Visit: Payer: Self-pay

## 2019-05-29 DIAGNOSIS — M109 Gout, unspecified: Secondary | ICD-10-CM | POA: Diagnosis present

## 2019-05-29 DIAGNOSIS — N183 Chronic kidney disease, stage 3 (moderate): Secondary | ICD-10-CM | POA: Diagnosis present

## 2019-05-29 DIAGNOSIS — Z886 Allergy status to analgesic agent status: Secondary | ICD-10-CM

## 2019-05-29 DIAGNOSIS — J209 Acute bronchitis, unspecified: Secondary | ICD-10-CM | POA: Diagnosis not present

## 2019-05-29 DIAGNOSIS — E1122 Type 2 diabetes mellitus with diabetic chronic kidney disease: Secondary | ICD-10-CM | POA: Diagnosis present

## 2019-05-29 DIAGNOSIS — Z85850 Personal history of malignant neoplasm of thyroid: Secondary | ICD-10-CM

## 2019-05-29 DIAGNOSIS — R0602 Shortness of breath: Secondary | ICD-10-CM

## 2019-05-29 DIAGNOSIS — R262 Difficulty in walking, not elsewhere classified: Secondary | ICD-10-CM | POA: Diagnosis not present

## 2019-05-29 DIAGNOSIS — I5033 Acute on chronic diastolic (congestive) heart failure: Secondary | ICD-10-CM | POA: Diagnosis present

## 2019-05-29 DIAGNOSIS — Z9981 Dependence on supplemental oxygen: Secondary | ICD-10-CM

## 2019-05-29 DIAGNOSIS — R791 Abnormal coagulation profile: Secondary | ICD-10-CM | POA: Diagnosis present

## 2019-05-29 DIAGNOSIS — Z8679 Personal history of other diseases of the circulatory system: Secondary | ICD-10-CM | POA: Diagnosis not present

## 2019-05-29 DIAGNOSIS — M1711 Unilateral primary osteoarthritis, right knee: Secondary | ICD-10-CM | POA: Diagnosis not present

## 2019-05-29 DIAGNOSIS — E871 Hypo-osmolality and hyponatremia: Secondary | ICD-10-CM | POA: Diagnosis not present

## 2019-05-29 DIAGNOSIS — Z79899 Other long term (current) drug therapy: Secondary | ICD-10-CM

## 2019-05-29 DIAGNOSIS — I442 Atrioventricular block, complete: Secondary | ICD-10-CM | POA: Diagnosis not present

## 2019-05-29 DIAGNOSIS — H409 Unspecified glaucoma: Secondary | ICD-10-CM | POA: Diagnosis present

## 2019-05-29 DIAGNOSIS — Z880 Allergy status to penicillin: Secondary | ICD-10-CM

## 2019-05-29 DIAGNOSIS — X58XXXA Exposure to other specified factors, initial encounter: Secondary | ICD-10-CM | POA: Diagnosis present

## 2019-05-29 DIAGNOSIS — Z801 Family history of malignant neoplasm of trachea, bronchus and lung: Secondary | ICD-10-CM

## 2019-05-29 DIAGNOSIS — I517 Cardiomegaly: Secondary | ICD-10-CM | POA: Diagnosis not present

## 2019-05-29 DIAGNOSIS — Z20828 Contact with and (suspected) exposure to other viral communicable diseases: Secondary | ICD-10-CM | POA: Diagnosis not present

## 2019-05-29 DIAGNOSIS — Z23 Encounter for immunization: Secondary | ICD-10-CM | POA: Diagnosis not present

## 2019-05-29 DIAGNOSIS — Z888 Allergy status to other drugs, medicaments and biological substances status: Secondary | ICD-10-CM

## 2019-05-29 DIAGNOSIS — G8929 Other chronic pain: Secondary | ICD-10-CM | POA: Diagnosis present

## 2019-05-29 DIAGNOSIS — Z8249 Family history of ischemic heart disease and other diseases of the circulatory system: Secondary | ICD-10-CM

## 2019-05-29 DIAGNOSIS — Z95810 Presence of automatic (implantable) cardiac defibrillator: Secondary | ICD-10-CM

## 2019-05-29 DIAGNOSIS — E785 Hyperlipidemia, unspecified: Secondary | ICD-10-CM | POA: Diagnosis present

## 2019-05-29 DIAGNOSIS — D631 Anemia in chronic kidney disease: Secondary | ICD-10-CM | POA: Diagnosis present

## 2019-05-29 DIAGNOSIS — Z79891 Long term (current) use of opiate analgesic: Secondary | ICD-10-CM

## 2019-05-29 DIAGNOSIS — Z7951 Long term (current) use of inhaled steroids: Secondary | ICD-10-CM

## 2019-05-29 DIAGNOSIS — J440 Chronic obstructive pulmonary disease with acute lower respiratory infection: Secondary | ICD-10-CM | POA: Diagnosis not present

## 2019-05-29 DIAGNOSIS — Z800 Family history of malignant neoplasm of digestive organs: Secondary | ICD-10-CM

## 2019-05-29 DIAGNOSIS — I130 Hypertensive heart and chronic kidney disease with heart failure and stage 1 through stage 4 chronic kidney disease, or unspecified chronic kidney disease: Secondary | ICD-10-CM | POA: Diagnosis not present

## 2019-05-29 DIAGNOSIS — K219 Gastro-esophageal reflux disease without esophagitis: Secondary | ICD-10-CM | POA: Diagnosis present

## 2019-05-29 DIAGNOSIS — I4892 Unspecified atrial flutter: Secondary | ICD-10-CM | POA: Diagnosis present

## 2019-05-29 DIAGNOSIS — J181 Lobar pneumonia, unspecified organism: Secondary | ICD-10-CM | POA: Diagnosis not present

## 2019-05-29 DIAGNOSIS — D649 Anemia, unspecified: Secondary | ICD-10-CM

## 2019-05-29 DIAGNOSIS — Z833 Family history of diabetes mellitus: Secondary | ICD-10-CM

## 2019-05-29 DIAGNOSIS — I700 Atherosclerosis of aorta: Secondary | ICD-10-CM | POA: Diagnosis not present

## 2019-05-29 DIAGNOSIS — R0600 Dyspnea, unspecified: Secondary | ICD-10-CM | POA: Diagnosis present

## 2019-05-29 DIAGNOSIS — M25511 Pain in right shoulder: Secondary | ICD-10-CM | POA: Diagnosis present

## 2019-05-29 DIAGNOSIS — Z7901 Long term (current) use of anticoagulants: Secondary | ICD-10-CM

## 2019-05-29 DIAGNOSIS — J449 Chronic obstructive pulmonary disease, unspecified: Secondary | ICD-10-CM | POA: Diagnosis present

## 2019-05-29 DIAGNOSIS — I4891 Unspecified atrial fibrillation: Secondary | ICD-10-CM | POA: Diagnosis present

## 2019-05-29 DIAGNOSIS — Z952 Presence of prosthetic heart valve: Secondary | ICD-10-CM

## 2019-05-29 DIAGNOSIS — M546 Pain in thoracic spine: Secondary | ICD-10-CM | POA: Diagnosis present

## 2019-05-29 DIAGNOSIS — Z9889 Other specified postprocedural states: Secondary | ICD-10-CM | POA: Diagnosis not present

## 2019-05-29 DIAGNOSIS — M25512 Pain in left shoulder: Secondary | ICD-10-CM | POA: Diagnosis present

## 2019-05-29 DIAGNOSIS — M47814 Spondylosis without myelopathy or radiculopathy, thoracic region: Secondary | ICD-10-CM | POA: Diagnosis not present

## 2019-05-29 DIAGNOSIS — Z87891 Personal history of nicotine dependence: Secondary | ICD-10-CM

## 2019-05-29 DIAGNOSIS — Z86010 Personal history of colonic polyps: Secondary | ICD-10-CM

## 2019-05-29 DIAGNOSIS — I10 Essential (primary) hypertension: Secondary | ICD-10-CM | POA: Diagnosis present

## 2019-05-29 DIAGNOSIS — Z885 Allergy status to narcotic agent status: Secondary | ICD-10-CM

## 2019-05-29 DIAGNOSIS — E039 Hypothyroidism, unspecified: Secondary | ICD-10-CM | POA: Diagnosis present

## 2019-05-29 DIAGNOSIS — Z79890 Hormone replacement therapy (postmenopausal): Secondary | ICD-10-CM

## 2019-05-29 DIAGNOSIS — J9611 Chronic respiratory failure with hypoxia: Secondary | ICD-10-CM | POA: Diagnosis present

## 2019-05-29 DIAGNOSIS — Z9841 Cataract extraction status, right eye: Secondary | ICD-10-CM

## 2019-05-29 DIAGNOSIS — T502X5A Adverse effect of carbonic-anhydrase inhibitors, benzothiadiazides and other diuretics, initial encounter: Secondary | ICD-10-CM | POA: Diagnosis present

## 2019-05-29 LAB — CBC WITH DIFFERENTIAL/PLATELET
Abs Immature Granulocytes: 0.02 10*3/uL (ref 0.00–0.07)
Basophils Absolute: 0 10*3/uL (ref 0.0–0.1)
Basophils Relative: 0 %
Eosinophils Absolute: 0 10*3/uL (ref 0.0–0.5)
Eosinophils Relative: 1 %
HCT: 30.5 % — ABNORMAL LOW (ref 36.0–46.0)
Hemoglobin: 9.7 g/dL — ABNORMAL LOW (ref 12.0–15.0)
Immature Granulocytes: 0 %
Lymphocytes Relative: 14 %
Lymphs Abs: 0.9 10*3/uL (ref 0.7–4.0)
MCH: 29.6 pg (ref 26.0–34.0)
MCHC: 31.8 g/dL (ref 30.0–36.0)
MCV: 93 fL (ref 80.0–100.0)
Monocytes Absolute: 0.5 10*3/uL (ref 0.1–1.0)
Monocytes Relative: 8 %
Neutro Abs: 5 10*3/uL (ref 1.7–7.7)
Neutrophils Relative %: 77 %
Platelets: 184 10*3/uL (ref 150–400)
RBC: 3.28 MIL/uL — ABNORMAL LOW (ref 3.87–5.11)
RDW: 16.2 % — ABNORMAL HIGH (ref 11.5–15.5)
WBC: 6.5 10*3/uL (ref 4.0–10.5)
nRBC: 0 % (ref 0.0–0.2)

## 2019-05-29 LAB — BASIC METABOLIC PANEL
Anion gap: 9 (ref 5–15)
BUN: 50 mg/dL — ABNORMAL HIGH (ref 8–23)
CO2: 30 mmol/L (ref 22–32)
Calcium: 9.2 mg/dL (ref 8.9–10.3)
Chloride: 90 mmol/L — ABNORMAL LOW (ref 98–111)
Creatinine, Ser: 1.86 mg/dL — ABNORMAL HIGH (ref 0.44–1.00)
GFR calc Af Amer: 28 mL/min — ABNORMAL LOW (ref >60)
GFR calc non Af Amer: 24 mL/min — ABNORMAL LOW (ref >60)
Glucose, Bld: 114 mg/dL — ABNORMAL HIGH (ref 70–99)
Potassium: 4.6 mmol/L (ref 3.5–5.1)
Sodium: 129 mmol/L — ABNORMAL LOW (ref 135–145)

## 2019-05-29 LAB — PROTIME-INR
INR: 1.9 — ABNORMAL HIGH (ref 0.8–1.2)
Prothrombin Time: 21.9 seconds — ABNORMAL HIGH (ref 11.4–15.2)

## 2019-05-29 LAB — D-DIMER, QUANTITATIVE: D-Dimer, Quant: 1.18 ug/mL-FEU — ABNORMAL HIGH (ref 0.00–0.50)

## 2019-05-29 MED ORDER — HEPARIN BOLUS VIA INFUSION
2000.0000 [IU] | Freq: Once | INTRAVENOUS | Status: AC
  Administered 2019-05-30: 2000 [IU] via INTRAVENOUS

## 2019-05-29 MED ORDER — HEPARIN (PORCINE) 25000 UT/250ML-% IV SOLN
1300.0000 [IU]/h | INTRAVENOUS | Status: DC
  Administered 2019-05-30 (×2): 1300 [IU]/h via INTRAVENOUS
  Filled 2019-05-29 (×2): qty 250

## 2019-05-29 MED ORDER — HYDROCODONE-ACETAMINOPHEN 5-325 MG PO TABS
1.0000 | ORAL_TABLET | Freq: Once | ORAL | Status: AC
  Administered 2019-05-29: 1 via ORAL
  Filled 2019-05-29: qty 1

## 2019-05-29 NOTE — Progress Notes (Addendum)
ANTICOAGULATION CONSULT NOTE - Initial Consult  Pharmacy Consult for heparin/warfarin Indication: pulmonary embolus/warfarin bridge/Hx of mechanical aortic valve replacement, and mitral valve replacement  Allergies  Allergen Reactions  . Penicillins Other (See Comments)    Bruise Did it involve swelling of the face/tongue/throat, SOB, or low BP? No Did it involve sudden or severe rash/hives, skin peeling, or any reaction on the inside of your mouth or nose? No Did you need to seek medical attention at a hospital or doctor's office? No When did it last happen?20+ years If all above answers are "NO", may proceed with cephalosporin use.   . Aspirin Other (See Comments)    On coumadin  . Fentanyl Dermatitis    Rash with patch   . Niacin Itching    Patient Measurements: Height: 5\' 7"  (170.2 cm) Weight: 193 lb (87.5 kg) IBW/kg (Calculated) : 61.6 Heparin Dosing Weight: 80.2 kg  Vital Signs: Temp: 98 F (36.7 C) (09/03 1808) Temp Source: Oral (09/03 1808) BP: 158/62 (09/03 2230) Pulse Rate: 59 (09/03 2230)  Labs: Recent Labs    05/29/19 2102  HGB 9.7*  HCT 30.5*  PLT 184  LABPROT 21.9*  INR 1.9*  CREATININE 1.86*    Estimated Creatinine Clearance: 25.1 mL/min (A) (by C-G formula based on SCr of 1.86 mg/dL (H)).   Medical History: Past Medical History:  Diagnosis Date  . Adenomatous polyp of colon 12/16/2002   Dr. [name]/[facility]  . Allergy   . Cataract   . CHB (complete heart block) ([place]) 10/07/2014      . CHF (congestive heart failure) ([place])    a.  EF previously reduced to 25-30% b. EF improved to 60-65% by echo in 10/2017.   Marland Kitchen Chronic kidney disease, stage I 2006  . Constipation       . COPD (chronic obstructive pulmonary disease) ([place])       . DJD (degenerative joint disease) of lumbar spine   . DM (diabetes mellitus) ([place]) 2006  . Esophageal reflux   . Glaucoma   . Gout       . H/O heart valve replacement  with mechanical valve    a. s/p mechanical AVR in 2001 and mechanical MVR in 2004.  Marland Kitchen H/O wisdom tooth extraction   . Hyperlipemia   . IDA (iron deficiency anemia)    Parenteral iron/[name]  . Insomnia       . Non-ischemic cardiomyopathy ([place])    a. s/p MDT CRTD  . Obesity, unspecified   . Other and unspecified hyperlipidemia   . Oxygen deficiency 2014   nocturnal;  . Spondylolisthesis   . Spondylosis   . Thyroid cancer ([place])    remote thyroidectomy, no recurrence, pt denies in 2016 thyroid cancer  . Type 2 diabetes mellitus with hyperglycemia ([place]) 07/15/2010  . Unspecified hypothyroidism       . 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 Vertigo         Assessment: [name] [name] is a 89yoF with PMH of mechanical aortic valve and mitral valve replacement on chronic warfarin therapy. On admission INR is slightly subtherapeutic at 1.9. Pharmacy consulted to dose warfarin and start heparin as a bridge in addition to suspicion for PE. Plan for VQ scan. Last anticoag visit was 05/21/2019 with therapeutic INR of 2.2.  PTA warfarin dose: 5 mg on Saturdays and 10 mg all other days. Last dose PTA was taken on 9/2.  Goal of Therapy:  Heparin  level 0.3-0.7 units/ml INR 2-2.5 (reduced goal since 04/2017 d/t bruising) Monitor platelets by anticoagulation protocol: Yes   Plan:  Give 2000 units bolus x 1 Start heparin infusion at 1300 units/hr Check anti-Xa level in 8 hours and daily while on heparin  Give warfarin 12 mg po x 1 Monitor daily INR, CBC, clinical course, s/sx of bleed, PO intake, drug-drug interactions   Thank you for allowing [place] to participate in this patients care. Jens Som, PharmD 05/29/2019,11:17 PM

## 2019-05-29 NOTE — ED Notes (Signed)
Patient transported to X-ray 

## 2019-05-29 NOTE — ED Triage Notes (Signed)
C/o right shoulder pain, rating pain 8/10.

## 2019-05-29 NOTE — ED Notes (Signed)
Pt given meal w/pain meds per PA

## 2019-05-29 NOTE — ED Provider Notes (Signed)
[facility] EMERGENCY DEPARTMENT Provider Note   CSN: [id number] Arrival date & time: 05/29/19  1721     History   Chief Complaint Chief Complaint  Patient presents with  . Shoulder Pain    right    HPI [name] [name] is a 85 y.o. female with a past medical history of CHF, chronic kidney disease, COPD on chronic home oxygen, DM, a fib on chronic coumadin therapy, complete heart block with ICD, h/o mechanical valve surgery and chronic low back pain currently undergoing physical therapy presenting with right upper back/scapula pain which started approximately 4 days ago.  She cannot recall the exact timing of the event but feels it started suddenly without waxing or waning character. She denies injury.  It is worsened with movement and palpation but can also feel it with deep inspiration.  She denies worsened shortness of breath beyond her baseline and denies chest pain, cough, fevers, no n/v, abdominal pain but has had a poor appetite today. She has had no medicines today, does take hydrocodone for chronic back pain relief but has not taken a tablet since last week.      HPI  Past Medical History:  Diagnosis Date  . Adenomatous polyp of colon 12/16/2002   Dr. [name]/[facility]  . Allergy   . Cataract   . CHB (complete heart block) ([place]) 10/07/2014      . CHF (congestive heart failure) ([place])    a.  EF previously reduced to 25-30% b. EF improved to 60-65% by echo in 10/2017.   Marland Kitchen Chronic kidney disease, stage I 2006  . Constipation       . COPD (chronic obstructive pulmonary disease) ([place])       . DJD (degenerative joint disease) of lumbar spine   . DM (diabetes mellitus) ([place]) 2006  . Esophageal reflux   . Glaucoma   . Gout       . H/O heart valve replacement with mechanical valve    a. s/p mechanical AVR in 2001 and mechanical MVR in 2004.  Marland Kitchen H/O wisdom tooth extraction   . Hyperlipemia   . IDA (iron deficiency anemia)    Parenteral  iron/[name]  . Insomnia       . Non-ischemic cardiomyopathy ([place])    a. s/p MDT CRTD  . Obesity, unspecified   . Other and unspecified hyperlipidemia   . Oxygen deficiency 2014   nocturnal;  . Spondylolisthesis   . Spondylosis   . Thyroid cancer ([place])    remote thyroidectomy, no recurrence, pt denies in 2016 thyroid cancer  . Type 2 diabetes mellitus with hyperglycemia ([place]) 07/15/2010  . Unspecified hypothyroidism       . 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 Vertigo         Patient Active Problem List   Diagnosis Date Noted  . Physical debility 04/28/2019  . Hyponatremia 09/27/2018  . H/O mitral valve replacement with mechanical valve 08/02/2018  . Chronic right hip pain 04/16/2018  . Trochanteric bursitis, right hip 03/07/2017  . Persistent atrial fibrillation   . Nocturnal hypoxia 10/08/2016  . Dependence on nocturnal oxygen therapy 10/08/2016  . Hematuria 03/20/2016  . S/P ICD (internal cardiac defibrillator) procedure 02/08/2015  . Presence of cardiac pacemaker 02/08/2015  . CHB (complete heart block) ([place]) 10/07/2014  . Fatigue 07/20/2014  . Absolute anemia 06/24/2014  . Osteopenia 02/10/2014  . Encounter for therapeutic drug monitoring 10/29/2013  . Spinal stenosis of  lumbar region 02/19/2013  . OA (osteoarthritis) of knee 02/19/2013  . Chronic pain of right knee 01/02/2013  . Hemolytic anemia ([place]) 09/24/2012  . H/O adenomatous polyp of colon 05/24/2012  . High risk medication use 05/24/2012  . Chronic diastolic heart failure ([place]) 05/19/2012  . Chronic anticoagulation, on coumadin for Mechanical AVR and MVR 04/01/2012  . Cardiorenal syndrome with renal failure 03/22/2012  . Hx of aortic valve replacement, mechanical 03/22/2012  . S/P MVR (mitral valve replacement) 03/22/2012  . Biventricular automatic implantable cardioverter defibrillator in situ 03/22/2012  . CKD (chronic kidney disease), stage IV ([place]) 03/22/2012  .  Warfarin-induced coagulopathy ([place]) 03/21/2012  . Papilloma of breast 03/06/2012  . Iron deficiency 10/04/2011  . Allergic rhinitis 02/14/2011  . Hypothyroidism 07/15/2010  . Mitral valve disease 07/15/2010  . Sinus node dysfunction ([place]) 07/15/2010  . Nausea without vomiting 06/07/2010  . HIP PAIN, RIGHT 04/23/2009  . VARICOSE VEINS LOWER EXTREMITIES W/OTH COMPS 02/09/2009  . Calculus of gallbladder with chronic cholecystitis without obstruction 02/09/2009  . Prediabetes 06/15/2008  . Hypothyroidism, postsurgical 02/24/2008  . Hyperlipidemia LDL goal <70 02/24/2008  . Essential hypertension 02/24/2008  . Non-ischemic cardiomyopathy with ICD 02/24/2008  . GERD 02/24/2008  . DEGENERATIVE JOINT DISEASE, SPINE 02/24/2008    Past Surgical History:  Procedure Laterality Date  . AORTIC AND MITRAL VALVE REPLACEMENT  2001  . BI-VENTRICULAR IMPLANTABLE CARDIOVERTER DEFIBRILLATOR UPGRADE N/A 01/18/2015   Procedure: BI-VENTRICULAR IMPLANTABLE CARDIOVERTER DEFIBRILLATOR UPGRADE;  Surgeon: Evans Lance, MD;  Location: [facility] CATH LAB;  Service: Cardiovascular;  Laterality: N/A;  . BIV ICD GENERATOR CHANGEOUT N/A 01/30/2019   Procedure: BIV ICD GENERATOR CHANGEOUT;  Surgeon: Evans Lance, MD;  Location: [facility] CV LAB;  Service: Cardiovascular;  Laterality: N/A;  . CARDIAC CATHETERIZATION    . CARDIAC VALVE REPLACEMENT    . CARDIOVERSION N/A 11/13/2016   Procedure: CARDIOVERSION;  Surgeon: Sanda Klein, MD;  Location: [facility] ENDOSCOPY;  Service: Cardiovascular;  Laterality: N/A;  . COLONOSCOPY  06/12/2007   Rourk- Normal rectum/Normal colon  . COLONOSCOPY  12/16/02   small hemorrhoids  . COLONOSCOPY  06/20/2012   Procedure: COLONOSCOPY;  Surgeon: Daneil Dolin, MD;  Location: [facility] ENDO SUITE;  Service: Endoscopy;  Laterality: N/A;  10:45  . defibrillator implanted 2006    . DOPPLER ECHOCARDIOGRAPHY  2012  . DOPPLER ECHOCARDIOGRAPHY  05,06,07,08,09,2011  . ESOPHAGOGASTRODUODENOSCOPY  06/12/2007    Rourk- normal esophagus, small hiatal hernia, otherwise normal stomach, D1, D2  . EYE SURGERY Right 2005  . EYE SURGERY Left 2006  . ICD LEAD REMOVAL Left 02/08/2015   Procedure: ICD LEAD REMOVAL;  Surgeon: Evans Lance, MD;  Location: [facility] OR;  Service: Cardiovascular;  Laterality: Left;  "Will plan extraction and insertion of a BiV PM"  **Dr. Roxan Hockey backing up case**  . IMPLANTABLE CARDIOVERTER DEFIBRILLATOR (ICD) GENERATOR CHANGE Left 02/08/2015   Procedure: ICD GENERATOR CHANGE;  Surgeon: Evans Lance, MD;  Location: [facility];  Service: Cardiovascular;  Laterality: Left;  . INSERT / REPLACE / REMOVE PACEMAKER    . PACEMAKER INSERTION  May 2016  . pacemaker placed  2004  . right breast cyst removed     benign   . right cataract removed     2005  . TEE WITHOUT CARDIOVERSION N/A 11/13/2016   Procedure: TRANSESOPHAGEAL ECHOCARDIOGRAM (TEE);  Surgeon: Sanda Klein, MD;  Location: [facility] ENDOSCOPY;  Service: Cardiovascular;  Laterality: N/A;  . THYROIDECTOMY       OB History  Gravida      Para      Term      Preterm      AB      Living  0     SAB      TAB      Ectopic      Multiple      Live Births               Home Medications    Prior to Admission medications   Medication Sig Start Date End Date Taking? Authorizing Provider  acetaminophen (TYLENOL) 500 MG tablet Take 500 mg by mouth every 6 (six) hours as needed (pain).    [provider]  albuterol (PROVENTIL HFA;VENTOLIN HFA) 108 (90 Base) MCG/ACT inhaler INHALE 2 PUFFS INTO THE LUNGS EVERY 6 HOURS AS NEEDED FOR WHEEZING OR SHORTNESS OF BREATH Patient taking differently: Inhale 2 puffs into the lungs every 6 (six) hours as needed for wheezing or shortness of breath.  09/30/18   Fayrene Helper, MD  albuterol (PROVENTIL) (2.5 MG/3ML) 0.083% nebulizer solution INHALE 1 VIAL VIA NEBULIZER THREE TIMES DAILY. Patient taking differently: Take 2.5 mg by nebulization 2 (two) times daily.  12/28/16    Fayrene Helper, MD  allopurinol (ZYLOPRIM) 100 MG tablet TAKE 1 TABLET(100 MG) BY MOUTH DAILY 04/17/19   Fayrene Helper, MD  benzonatate (TESSALON) 100 MG capsule Take 1 capsule (100 mg total) by mouth 2 (two) times daily as needed for cough. 04/28/19   Fayrene Helper, MD  BREO ELLIPTA 100-25 MCG/INH AEPB 1 TAKE 1 INHALATION BY MOUTH ONCE DAILY 05/20/19   Fayrene Helper, MD  cholecalciferol (VITAMIN D3) 25 MCG (1000 UT) tablet Take 1,000 Units by mouth daily.    [provider]  diphenhydramine-acetaminophen (TYLENOL PM) 25-500 MG TABS tablet Take 1 tablet by mouth at bedtime as needed (sleep).    [provider]  docusate sodium (COLACE) 100 MG capsule Take 300 mg by mouth at bedtime as needed for mild constipation.     [provider]  fluticasone (FLONASE) 50 MCG/ACT nasal spray SHAKE LIQUID AND USE 2 SPRAYS IN EACH NOSTRIL DAILY Patient taking differently: Place 2 sprays into both nostrils daily as needed for allergies.  12/23/18   Fayrene Helper, MD  folic acid (FOLVITE) 1 MG tablet Take 1 tablet (1 mg total) by mouth daily. Patient taking differently: Take 1 mg by mouth at bedtime.  09/30/18   Fayrene Helper, MD  furosemide (LASIX) 40 MG tablet Take 1 tablet (40 mg total) by mouth daily. Skip dose today and start from 1/4 05/23/19   Croitoru, Mihai, MD  gabapentin (NEURONTIN) 300 MG capsule TAKE 1 CAPSULE BY MOUTH AT BEDTIME 04/17/19   Fayrene Helper, MD  hydrALAZINE (APRESOLINE) 25 MG tablet Take 1 tablet (25 mg total) by mouth 2 (two) times daily. 05/19/19   Croitoru, Mihai, MD  HYDROcodone-acetaminophen (NORCO) 7.5-325 MG tablet Take 1 tablet by mouth every 6 (six) hours as needed for moderate pain (Must last 30 days.). 04/08/19   Sanjuana Kava, MD  isosorbide mononitrate (IMDUR) 30 MG 24 hr tablet Take 1 tablet (30 mg total) by mouth daily. 05/23/19 08/21/19  Croitoru, Mihai, MD  Liniments (SALONPAS PAIN RELIEF PATCH EX) Apply 1 patch  topically daily as needed (pain).    [provider]  meclizine (ANTIVERT) 12.5 MG tablet Take 1 tablet (12.5 mg total) by mouth 3 (three) times daily as needed for dizziness. 12/10/18  Fayrene Helper, MD  metolazone (ZAROXOLYN) 2.5 MG tablet Take 1 tablet (2.5 mg total) by mouth 2 (two) times a week. Wednesdays and Saturdays Skip dose on 1/4 and start from 1/8 05/26/19   Croitoru, Mihai, MD  metoprolol succinate (TOPROL-XL) 100 MG 24 hr tablet TAKE 1 TABLET BY MOUTH DAILY. TAKE WITH OR IMMEDIATELY FOLLOWING A MEAL. 04/11/19   Fayrene Helper, MD  Multiple Vitamin (MULTIVITAMIN) LIQD Take 5 mLs by mouth daily.    [provider]  ondansetron (ZOFRAN) 4 MG tablet Take 1 tablet (4 mg total) by mouth daily as needed for nausea or vomiting. 10/09/18   Fayrene Helper, MD  Polyethyl [name] (SYSTANE) 0.4-0.3 % GEL ophthalmic gel Place 1 application into the left eye at bedtime.    [provider]  potassium chloride SA (K-DUR) 20 MEQ tablet TAKE 1 TABLET BY MOUTH DAILY Patient taking differently: Take 20 mEq by mouth daily.  01/24/19   Fayrene Helper, MD  pravastatin (PRAVACHOL) 40 MG tablet Take 1 tablet (40 mg total) by mouth every evening. 05/20/19   Fayrene Helper, MD  SYNTHROID 75 MCG tablet TAKE 1 TABLET BY MOUTH DAILY 04/24/19   Fayrene Helper, MD  warfarin (JANTOVEN) 5 MG tablet Take as directed by coumadin clinic 05/16/19   Croitoru, Dani Gobble, MD    Family History Family History  Problem Relation Age of Onset  . Pancreatic cancer Mother   . Heart disease Father   . Heart disease Sister   . Heart attack Sister   . Heart disease Brother   . Diabetes Brother   . Heart disease Brother   . Diabetes Brother   . Hypertension Brother   . Lung cancer Brother     Social History Social History   Tobacco Use  . Smoking status: Former Smoker    Packs/day: 0.75    Years: 40.00    Pack years: 30.00    Types: Cigarettes    Quit date:  03/08/1987    Years since quitting: 32.2  . Smokeless tobacco: Never Used  Substance Use Topics  . Alcohol use: No    Alcohol/week: 0.0 standard drinks  . Drug use: No     Allergies   Penicillins, Aspirin, Fentanyl, and Niacin   Review of Systems Review of Systems  Constitutional: Positive for appetite change. Negative for chills and fever.  HENT: Negative.   Respiratory: Positive for shortness of breath. Negative for cough and chest tightness.        Baseline sob,  worsened with advent of back pain.  Cardiovascular: Negative for chest pain, palpitations and leg swelling.  Gastrointestinal: Negative for abdominal distention, abdominal pain, constipation, nausea and vomiting.  Genitourinary: Negative for difficulty urinating, dysuria, flank pain, frequency and urgency.  Musculoskeletal: Positive for back pain. Negative for gait problem and joint swelling.  Skin: Negative for rash and wound.  Neurological: Negative for weakness and numbness.     Physical Exam Updated Vital Signs BP (!) 140/57   Pulse 60   Temp 98 F (36.7 C) (Oral)   Resp 20   Ht 5\' 7"  (1.702 m)   Wt 87.5 kg   SpO2 100%   BMI 30.23 kg/m   Physical Exam Vitals signs and nursing note reviewed.  Constitutional:      Appearance: She is well-developed.  HENT:     Head: Normocephalic and atraumatic.  Eyes:     Conjunctiva/sclera: Conjunctivae normal.  Neck:     Musculoskeletal: Normal  range of motion.  Cardiovascular:     Rate and Rhythm: Normal rate and regular rhythm.     Pulses: Normal pulses.          Dorsalis pedis pulses are 2+ on the right side and 2+ on the left side.     Heart sounds: No friction rub.     Comments: Mechanical valve Pulmonary:     Effort: Pulmonary effort is normal.     Breath sounds: Normal breath sounds. No wheezing.  Abdominal:     General: Bowel sounds are normal.     Palpations: Abdomen is soft.     Tenderness: There is no abdominal tenderness.  Musculoskeletal:  Normal range of motion.        General: Tenderness present.       Back:     Comments: ttp beneath right scapula.  No edema, no bruising, no rash. Thoracic and lumbar nontender midline.  Skin:    General: Skin is warm and dry.     Findings: No erythema or rash.  Neurological:     Mental Status: She is alert.      ED Treatments / Results  Labs (all labs ordered are listed, but only abnormal results are displayed) Labs Reviewed  CBC WITH DIFFERENTIAL/PLATELET - Abnormal; Notable for the following components:      Result Value   RBC 3.28 (*)    Hemoglobin 9.7 (*)    HCT 30.5 (*)    RDW 16.2 (*)    All other components within normal limits  BASIC METABOLIC PANEL - Abnormal; Notable for the following components:   Sodium 129 (*)    Chloride 90 (*)    Glucose, Bld 114 (*)    BUN 50 (*)    Creatinine, Ser 1.86 (*)    GFR calc non Af Amer 24 (*)    GFR calc Af Amer 28 (*)    All other components within normal limits  PROTIME-INR - Abnormal; Notable for the following components:   Prothrombin Time 21.9 (*)    INR 1.9 (*)    All other components within normal limits  D-DIMER, QUANTITATIVE (NOT AT [facility]) - Abnormal; Notable for the following components:   D-Dimer, Quant 1.18 (*)    All other components within normal limits    EKG EKG Interpretation  Date/Time:  Thursday May 29 2019 19:26:20 EDT Ventricular Rate:  60 PR Interval:    QRS Duration: 184 QT Interval:  540 QTC Calculation: 540 R Axis:   151 Text Interpretation:  Atrial flutter Nonspecific intraventricular conduction delay paced, similar to prior 6/20 Confirmed by Aletta Edouard [phone]) on 05/29/2019 7:44:01 PM   Radiology Dg Chest 2 View  Result Date: 05/29/2019 CLINICAL DATA:  Patient with history of CHF. EXAM: CHEST - 2 VIEW COMPARISON:  Chest radiograph 03/20/2019 FINDINGS: Monitoring leads overlie the patient. Multi lead AICD device overlies the left hemithorax, leads are stable in position. Stable  cardiomegaly status post median sternotomy. Aortic atherosclerosis. Interval increase in retrocardiac and right lung base consolidation. No pleural effusion or pneumothorax. Thoracic spine degenerative changes. IMPRESSION: Increased retrocardiac and right basilar consolidation which may represent atelectasis or edema. Electronically Signed   By: Lovey Newcomer M.D.   On: 05/29/2019 20:15    Procedures Procedures (including critical care time)  Medications Ordered in ED Medications  HYDROcodone-acetaminophen (NORCO/VICODIN) 5-325 MG per tablet 1 tablet (1 tablet Oral Given 05/29/19 1920)     Initial Impression / Assessment and Plan / ED Course  I have reviewed the triage vital signs and the nursing notes.  Pertinent labs & imaging results that were available during my care of the patient were reviewed by me and considered in my medical decision making (see chart for details).  Clinical Course as of May 28 2220  Thu May 28, 5670  [id number] 85 year old female complaining of right shoulder pain and some shortness of breath has been going on for about 4 days.  No trauma.  She is satting 100% and looks to be breathing comfortably.  She is got a chest x-ray which is showing some right-sided possible infiltrate.  Labs are pending.  Disposition per results of work-up.   [MB]    Clinical Course User Index [MB] Hayden Rasmussen, MD       Patient with right upper/scapular region pain which started spontaneously 4 days ago.  She initially denied increased shortness of breath beyond her baseline, but then stated she has had increased shortness of breath.  She has a slightly subtherapeutic Coumadin level at 1.9 and an elevated d-dimer raising concern for possible PE as source of her symptoms.  Her creatinine is elevated 1.86, therefore she cannot undergo CT angios imaging.  Recommend admission for VQ scan, consider heparin until coumadin therapeutic.  Other lab abnormalities include anemia, but this is stable,  as is her renal insufficiency.   Discussed finding with patient and with niece at bedside.  She is agreeable to admission.  Heparin per pharmacy consult ordered.  Covid test also ordered.  Pending call back from hospitalist.  Discussed with Wyn Quaker PA-C who will arrange admission.  Pt was seen by Dr. Melina Copa during this visit.  Final Clinical Impressions(s) / ED Diagnoses   Final diagnoses:  Acute right-sided thoracic back pain  Shortness of breath    ED Discharge Orders    None       Landis Martins 05/29/19 2314    Hayden Rasmussen, MD 05/30/19 1051

## 2019-05-30 ENCOUNTER — Observation Stay (HOSPITAL_COMMUNITY): Payer: Medicare Other

## 2019-05-30 DIAGNOSIS — R0600 Dyspnea, unspecified: Secondary | ICD-10-CM | POA: Diagnosis not present

## 2019-05-30 DIAGNOSIS — I5033 Acute on chronic diastolic (congestive) heart failure: Secondary | ICD-10-CM | POA: Diagnosis not present

## 2019-05-30 DIAGNOSIS — Z86010 Personal history of colonic polyps: Secondary | ICD-10-CM | POA: Diagnosis not present

## 2019-05-30 DIAGNOSIS — I10 Essential (primary) hypertension: Secondary | ICD-10-CM | POA: Diagnosis not present

## 2019-05-30 DIAGNOSIS — D649 Anemia, unspecified: Secondary | ICD-10-CM

## 2019-05-30 DIAGNOSIS — Z20828 Contact with and (suspected) exposure to other viral communicable diseases: Secondary | ICD-10-CM | POA: Diagnosis present

## 2019-05-30 DIAGNOSIS — M25511 Pain in right shoulder: Secondary | ICD-10-CM | POA: Diagnosis present

## 2019-05-30 DIAGNOSIS — J9611 Chronic respiratory failure with hypoxia: Secondary | ICD-10-CM | POA: Diagnosis present

## 2019-05-30 DIAGNOSIS — Z885 Allergy status to narcotic agent status: Secondary | ICD-10-CM | POA: Diagnosis not present

## 2019-05-30 DIAGNOSIS — E785 Hyperlipidemia, unspecified: Secondary | ICD-10-CM | POA: Diagnosis not present

## 2019-05-30 DIAGNOSIS — I442 Atrioventricular block, complete: Secondary | ICD-10-CM | POA: Diagnosis present

## 2019-05-30 DIAGNOSIS — X58XXXA Exposure to other specified factors, initial encounter: Secondary | ICD-10-CM | POA: Diagnosis present

## 2019-05-30 DIAGNOSIS — E871 Hypo-osmolality and hyponatremia: Secondary | ICD-10-CM | POA: Diagnosis present

## 2019-05-30 DIAGNOSIS — E039 Hypothyroidism, unspecified: Secondary | ICD-10-CM | POA: Diagnosis not present

## 2019-05-30 DIAGNOSIS — Z880 Allergy status to penicillin: Secondary | ICD-10-CM | POA: Diagnosis not present

## 2019-05-30 DIAGNOSIS — I4892 Unspecified atrial flutter: Secondary | ICD-10-CM | POA: Diagnosis present

## 2019-05-30 DIAGNOSIS — H409 Unspecified glaucoma: Secondary | ICD-10-CM | POA: Diagnosis present

## 2019-05-30 DIAGNOSIS — Z886 Allergy status to analgesic agent status: Secondary | ICD-10-CM | POA: Diagnosis not present

## 2019-05-30 DIAGNOSIS — K219 Gastro-esophageal reflux disease without esophagitis: Secondary | ICD-10-CM | POA: Diagnosis not present

## 2019-05-30 DIAGNOSIS — M546 Pain in thoracic spine: Secondary | ICD-10-CM | POA: Diagnosis not present

## 2019-05-30 DIAGNOSIS — Z23 Encounter for immunization: Secondary | ICD-10-CM | POA: Diagnosis not present

## 2019-05-30 DIAGNOSIS — M25512 Pain in left shoulder: Secondary | ICD-10-CM | POA: Diagnosis present

## 2019-05-30 DIAGNOSIS — M109 Gout, unspecified: Secondary | ICD-10-CM | POA: Diagnosis present

## 2019-05-30 DIAGNOSIS — I130 Hypertensive heart and chronic kidney disease with heart failure and stage 1 through stage 4 chronic kidney disease, or unspecified chronic kidney disease: Secondary | ICD-10-CM | POA: Diagnosis present

## 2019-05-30 DIAGNOSIS — Z952 Presence of prosthetic heart valve: Secondary | ICD-10-CM | POA: Diagnosis not present

## 2019-05-30 DIAGNOSIS — Z888 Allergy status to other drugs, medicaments and biological substances status: Secondary | ICD-10-CM | POA: Diagnosis not present

## 2019-05-30 DIAGNOSIS — Z85850 Personal history of malignant neoplasm of thyroid: Secondary | ICD-10-CM | POA: Diagnosis not present

## 2019-05-30 DIAGNOSIS — Z9981 Dependence on supplemental oxygen: Secondary | ICD-10-CM | POA: Diagnosis not present

## 2019-05-30 DIAGNOSIS — R0602 Shortness of breath: Secondary | ICD-10-CM | POA: Diagnosis not present

## 2019-05-30 DIAGNOSIS — D631 Anemia in chronic kidney disease: Secondary | ICD-10-CM | POA: Diagnosis present

## 2019-05-30 LAB — GLUCOSE, CAPILLARY
Glucose-Capillary: 76 mg/dL (ref 70–99)
Glucose-Capillary: 104 mg/dL — ABNORMAL HIGH (ref 70–99)
Glucose-Capillary: 130 mg/dL — ABNORMAL HIGH (ref 70–99)
Glucose-Capillary: 87 mg/dL (ref 70–99)

## 2019-05-30 LAB — CBC
HCT: 30.2 % — ABNORMAL LOW (ref 36.0–46.0)
Hemoglobin: 9.5 g/dL — ABNORMAL LOW (ref 12.0–15.0)
MCH: 29.6 pg (ref 26.0–34.0)
MCHC: 31.5 g/dL (ref 30.0–36.0)
MCV: 94.1 fL (ref 80.0–100.0)
Platelets: 166 10*3/uL (ref 150–400)
RBC: 3.21 MIL/uL — ABNORMAL LOW (ref 3.87–5.11)
RDW: 16.2 % — ABNORMAL HIGH (ref 11.5–15.5)
WBC: 5.3 10*3/uL (ref 4.0–10.5)
nRBC: 0 % (ref 0.0–0.2)

## 2019-05-30 LAB — PROTIME-INR
INR: 2.1 — ABNORMAL HIGH (ref 0.8–1.2)
Prothrombin Time: 23 seconds — ABNORMAL HIGH (ref 11.4–15.2)

## 2019-05-30 LAB — BASIC METABOLIC PANEL
Anion gap: 10 (ref 5–15)
BUN: 47 mg/dL — ABNORMAL HIGH (ref 8–23)
CO2: 30 mmol/L (ref 22–32)
Calcium: 9 mg/dL (ref 8.9–10.3)
Chloride: 91 mmol/L — ABNORMAL LOW (ref 98–111)
Creatinine, Ser: 1.54 mg/dL — ABNORMAL HIGH (ref 0.44–1.00)
GFR calc Af Amer: 35 mL/min — ABNORMAL LOW (ref >60)
GFR calc non Af Amer: 30 mL/min — ABNORMAL LOW (ref >60)
Glucose, Bld: 94 mg/dL (ref 70–99)
Potassium: 4.2 mmol/L (ref 3.5–5.1)
Sodium: 131 mmol/L — ABNORMAL LOW (ref 135–145)

## 2019-05-30 LAB — HEPARIN LEVEL (UNFRACTIONATED)
Heparin Unfractionated: 0.67 IU/mL (ref 0.30–0.70)
Heparin Unfractionated: 0.58 IU/mL (ref 0.30–0.70)

## 2019-05-30 LAB — SARS CORONAVIRUS 2 BY RT PCR (HOSPITAL ORDER, PERFORMED IN ~~LOC~~ HOSPITAL LAB): SARS Coronavirus 2: NEGATIVE

## 2019-05-30 MED ORDER — FLUTICASONE FUROATE-VILANTEROL 100-25 MCG/INH IN AEPB
1.0000 | INHALATION_SPRAY | Freq: Every day | RESPIRATORY_TRACT | Status: DC
  Administered 2019-05-30 – 2019-05-31 (×2): 1 via RESPIRATORY_TRACT
  Filled 2019-05-30 (×2): qty 28

## 2019-05-30 MED ORDER — POTASSIUM CHLORIDE CRYS ER 20 MEQ PO TBCR
20.0000 meq | EXTENDED_RELEASE_TABLET | Freq: Every day | ORAL | Status: DC
  Administered 2019-05-30 – 2019-05-31 (×2): 20 meq via ORAL
  Filled 2019-05-30 (×2): qty 1

## 2019-05-30 MED ORDER — HYDROCODONE-ACETAMINOPHEN 5-325 MG PO TABS
1.0000 | ORAL_TABLET | ORAL | Status: DC | PRN
  Administered 2019-05-30 (×2): 1 via ORAL
  Filled 2019-05-30 (×3): qty 1

## 2019-05-30 MED ORDER — FUROSEMIDE 40 MG PO TABS
40.0000 mg | ORAL_TABLET | Freq: Every day | ORAL | Status: DC
  Administered 2019-05-31: 40 mg via ORAL
  Filled 2019-05-30: qty 1

## 2019-05-30 MED ORDER — WARFARIN SODIUM 7.5 MG PO TABS
ORAL_TABLET | ORAL | Status: AC
  Filled 2019-05-30: qty 1

## 2019-05-30 MED ORDER — ALBUTEROL SULFATE (2.5 MG/3ML) 0.083% IN NEBU
2.5000 mg | INHALATION_SOLUTION | Freq: Two times a day (BID) | RESPIRATORY_TRACT | Status: DC
  Administered 2019-05-30 – 2019-05-31 (×3): 2.5 mg via RESPIRATORY_TRACT
  Filled 2019-05-30 (×4): qty 3

## 2019-05-30 MED ORDER — PRAVASTATIN SODIUM 40 MG PO TABS
40.0000 mg | ORAL_TABLET | Freq: Every evening | ORAL | Status: DC
  Administered 2019-05-30: 40 mg via ORAL
  Filled 2019-05-30: qty 1

## 2019-05-30 MED ORDER — WARFARIN - PHARMACIST DOSING INPATIENT
Freq: Every day | Status: DC
  Administered 2019-05-30: 17:00:00

## 2019-05-30 MED ORDER — ACETAMINOPHEN 650 MG RE SUPP: 650.0000 mg | Freq: Four times a day (QID) | RECTAL | Status: DC | PRN

## 2019-05-30 MED ORDER — TECHNETIUM TO 99M ALBUMIN AGGREGATED
1.5000 | Freq: Once | INTRAVENOUS | Status: AC | PRN
  Administered 2019-05-30: 1.5 via INTRAVENOUS

## 2019-05-30 MED ORDER — HYDRALAZINE HCL 25 MG PO TABS
25.0000 mg | ORAL_TABLET | Freq: Two times a day (BID) | ORAL | Status: DC
  Administered 2019-05-30 – 2019-05-31 (×3): 25 mg via ORAL
  Filled 2019-05-30 (×3): qty 1

## 2019-05-30 MED ORDER — ARTIFICIAL TEARS OPHTHALMIC OINT
1.0000 "application " | TOPICAL_OINTMENT | Freq: Every day | OPHTHALMIC | Status: DC
  Administered 2019-05-30: 1 via OPHTHALMIC
  Filled 2019-05-30 (×4): qty 3.5

## 2019-05-30 MED ORDER — WARFARIN SODIUM 5 MG PO TABS
10.0000 mg | ORAL_TABLET | Freq: Once | ORAL | Status: AC
  Administered 2019-05-30: 10 mg via ORAL
  Filled 2019-05-30: qty 2

## 2019-05-30 MED ORDER — FUROSEMIDE 10 MG/ML IJ SOLN
40.0000 mg | Freq: Once | INTRAMUSCULAR | Status: AC
  Administered 2019-05-30: 40 mg via INTRAVENOUS
  Filled 2019-05-30: qty 4

## 2019-05-30 MED ORDER — DOCUSATE SODIUM 100 MG PO CAPS: 300.0000 mg | ORAL_CAPSULE | Freq: Every evening | ORAL | Status: DC | PRN

## 2019-05-30 MED ORDER — WARFARIN SODIUM 2 MG PO TABS
ORAL_TABLET | ORAL | Status: AC
  Filled 2019-05-30: qty 1

## 2019-05-30 MED ORDER — PNEUMOCOCCAL VAC POLYVALENT 25 MCG/0.5ML IJ INJ
0.5000 mL | INJECTION | INTRAMUSCULAR | Status: AC
  Administered 2019-05-31: 0.5 mL via INTRAMUSCULAR
  Filled 2019-05-30: qty 0.5

## 2019-05-30 MED ORDER — LEVOTHYROXINE SODIUM 75 MCG PO TABS
75.0000 ug | ORAL_TABLET | Freq: Every day | ORAL | Status: DC
  Administered 2019-05-31: 75 ug via ORAL
  Filled 2019-05-30: qty 1

## 2019-05-30 MED ORDER — FLUTICASONE PROPIONATE 50 MCG/ACT NA SUSP: 2.0000 | Freq: Every day | NASAL | Status: DC | PRN

## 2019-05-30 MED ORDER — FUROSEMIDE 40 MG PO TABS: 40.0000 mg | ORAL_TABLET | Freq: Every day | ORAL | Status: DC

## 2019-05-30 MED ORDER — PANTOPRAZOLE SODIUM 40 MG PO TBEC
40.0000 mg | DELAYED_RELEASE_TABLET | Freq: Every day | ORAL | Status: DC
  Administered 2019-05-30 – 2019-05-31 (×2): 40 mg via ORAL
  Filled 2019-05-30 (×2): qty 1

## 2019-05-30 MED ORDER — ISOSORBIDE MONONITRATE ER 30 MG PO TB24
30.0000 mg | ORAL_TABLET | Freq: Every day | ORAL | Status: DC
  Administered 2019-05-30 – 2019-05-31 (×2): 30 mg via ORAL
  Filled 2019-05-30 (×2): qty 1

## 2019-05-30 MED ORDER — GABAPENTIN 300 MG PO CAPS
300.0000 mg | ORAL_CAPSULE | Freq: Every day | ORAL | Status: DC
  Administered 2019-05-30: 300 mg via ORAL
  Filled 2019-05-30: qty 1

## 2019-05-30 MED ORDER — WARFARIN SODIUM 2.5 MG PO TABS
ORAL_TABLET | ORAL | Status: AC
  Filled 2019-05-30: qty 1

## 2019-05-30 MED ORDER — ACETAMINOPHEN 325 MG PO TABS
650.0000 mg | ORAL_TABLET | Freq: Four times a day (QID) | ORAL | Status: DC | PRN
  Administered 2019-05-30: 21:00:00 650 mg via ORAL
  Filled 2019-05-30 (×2): qty 2

## 2019-05-30 MED ORDER — TECHNETIUM TC 99M DIETHYLENETRIAME-PENTAACETIC ACID
30.0000 | Freq: Once | INTRAVENOUS | Status: AC | PRN
  Administered 2019-05-30: 13:00:00 29.8 via RESPIRATORY_TRACT

## 2019-05-30 MED ORDER — ALLOPURINOL 100 MG PO TABS
100.0000 mg | ORAL_TABLET | Freq: Every day | ORAL | Status: DC
  Administered 2019-05-30 – 2019-05-31 (×2): 100 mg via ORAL
  Filled 2019-05-30 (×2): qty 1

## 2019-05-30 MED ORDER — WARFARIN SODIUM 6 MG PO TABS
12.0000 mg | ORAL_TABLET | Freq: Once | ORAL | Status: AC
  Administered 2019-05-30: 03:00:00 12 mg via ORAL
  Filled 2019-05-30: qty 2

## 2019-05-30 MED ORDER — LEVOTHYROXINE SODIUM 75 MCG PO TABS: 75.0000 ug | ORAL_TABLET | Freq: Every day | ORAL | Status: DC

## 2019-05-30 MED ORDER — ORAL CARE MOUTH RINSE
15.0000 mL | Freq: Two times a day (BID) | OROMUCOSAL | Status: DC
  Administered 2019-05-30 – 2019-05-31 (×3): 15 mL via OROMUCOSAL

## 2019-05-30 NOTE — Progress Notes (Signed)
Patient seen and examined.  Admitted after midnight secondary to bilateral shoulder discomfort and increased dyspnea.  Found with elevated d-dimer chest x-ray suggesting increased vascular congestion/atelectasis and mild hypoxia; she usually uses 2L [place] and is requiring 4L now..  She is chronically on Coumadin after mechanical valve replacement but was found to be subtherapeutic. Also Chronic kidney disease making CT angiogram not An Option for evaluation.  Vital signs currently stable on higher oxygen supplementation and patient in no acute distress.  VQ scan ordered and pending.  Please refer to H&P written by Dr. Olevia Bowens on 05/30/2019 for further info/details on admission.  Plan: -Start the use of flutter valve -Continue oxygen supplementation and nebulizer management -Give 1 dose of Lasix IV with subsequent resumption of oral Lasix -Follow daily weights and strict I's and O's. -Follow-up VQ scan results -Currently optimizing patient anticoagulation therapy using heparin drip and giving higher dose of Coumadin (dosing per pharmacy). -wean oxygen supplementation as tolerated. (chronically uses 2L)  Barton Dubois MD [phone]

## 2019-05-30 NOTE — Progress Notes (Signed)
ANTICOAGULATION CONSULT NOTE -  Pharmacy Consult for heparin/warfarin Indication: pulmonary embolus/warfarin bridge/Hx of mechanical aortic valve replacement, and mitral valve replacement  Allergies  Allergen Reactions  . Penicillins Other (See Comments)    Bruise Did it involve swelling of the face/tongue/throat, SOB, or low BP? No Did it involve sudden or severe rash/hives, skin peeling, or any reaction on the inside of your mouth or nose? No Did you need to seek medical attention at a hospital or doctor's office? No When did it last happen?20+ years If all above answers are "NO", may proceed with cephalosporin use.   . Aspirin Other (See Comments)    On coumadin  . Fentanyl Dermatitis    Rash with patch   . Niacin Itching    Patient Measurements: Height: 5\' 7"  (170.2 cm) Weight: 193 lb (87.5 kg) IBW/kg (Calculated) : 61.6 Heparin Dosing Weight: 80.2 kg  Vital Signs: Temp: 98.5 F (36.9 C) (09/04 0653) Temp Source: Oral (09/04 0653) BP: 175/63 (09/04 0653) Pulse Rate: 60 (09/04 0653)  Labs: Recent Labs    05/29/19 2102 05/30/19 0743  HGB 9.7* 9.5*  HCT 30.5* 30.2*  PLT 184 166  LABPROT 21.9* 23.0*  INR 1.9* 2.1*  HEPARINUNFRC  --  0.67  CREATININE 1.86* 1.54*    Estimated Creatinine Clearance: 30.4 mL/min (A) (by C-G formula based on SCr of 1.54 mg/dL (H)).   Medical History: Past Medical History:  Diagnosis Date  . Adenomatous polyp of colon 12/16/2002   Dr. [name]/[facility]  . Allergy   . Cataract   . CHB (complete heart block) ([place]) 10/07/2014      . CHF (congestive heart failure) ([place])    a.  EF previously reduced to 25-30% b. EF improved to 60-65% by echo in 10/2017.   Marland Kitchen Chronic kidney disease, stage I 2006  . Constipation       . COPD (chronic obstructive pulmonary disease) ([place])       . DJD (degenerative joint disease) of lumbar spine   . DM (diabetes mellitus) ([place]) 2006  . Esophageal reflux   .  Glaucoma   . Gout       . H/O heart valve replacement with mechanical valve    a. s/p mechanical AVR in 2001 and mechanical MVR in 2004.  Marland Kitchen H/O wisdom tooth extraction   . Hyperlipemia   . IDA (iron deficiency anemia)    Parenteral iron/[name]  . Insomnia       . Non-ischemic cardiomyopathy ([place])    a. s/p MDT CRTD  . Obesity, unspecified   . Other and unspecified hyperlipidemia   . Oxygen deficiency 2014   nocturnal;  . Spondylolisthesis   . Spondylosis   . Thyroid cancer ([place])    remote thyroidectomy, no recurrence, pt denies in 2016 thyroid cancer  . Type 2 diabetes mellitus with hyperglycemia ([place]) 07/15/2010  . Unspecified hypothyroidism       . 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 Vertigo         Assessment: [name] [name] is a 24yoF with PMH of mechanical aortic valve and mitral valve replacement on chronic warfarin therapy.  Pharmacy consulted to dose warfarin and start heparin as a bridge in addition to suspicion for PE. Plan for VQ scan. Last anticoag visit was 05/21/2019 with therapeutic INR of 2.2.  Current INR 2.1 HL 0.67  PTA warfarin dose: 5 mg on Saturdays and 10 mg all other days. Last dose  PTA was taken on 9/2.  Goal of Therapy:  Heparin level 0.3-0.7 units/ml INR 2-2.5 (reduced goal since 04/2017 d/t bruising) Monitor platelets by anticoagulation protocol: Yes   Plan:  Continue heparin infusion at 1300 units/hr Check anti-Xa level in 8 hours and daily while on heparin Continue heparin infusion until INR therapeutic for 24 hours.  Give warfarin 10 mg po x 1 Monitor daily INR, CBC, and s/s of bleeding   Thank you for allowing [place] to participate in this patients care.  Margot Ables, PharmD Clinical Pharmacist 05/30/2019 9:06 AM

## 2019-05-30 NOTE — H&P (Addendum)
History and Physical    [name] [name] EQA:[id number] DOB: 31-Oct-1931 DOA: 05/29/2019  PCP: Fayrene Helper, MD   Patient coming from: Home.  I have personally briefly reviewed patient's old medical records in [place]  Chief Complaint: Right shoulder pain.  HPI: [name] [name] is a 85 y.o. female with medical history significant of colon polyps, allergies, cataracts, history of CHF with normalized EF on recent echo, stage I CKD, constipation, GERD, COPD, lumbar spine DJD, type 2 diabetes, glaucoma, gout, history of AVR in 2001 and mechanical MVR in 2004, hyperlipidemia, iron deficiency anemia history of thyroid cancer with thyroidectomy, vertigo who is coming to the emergency department due to progressively worse right shoulder pain for the past 3 days.  Patient states that the onset was insidious, but she has been having progressively worse pain since then.  She has had some pain in her left shoulder as well.  Pain is not elicited by palpation or percussion.  However, the pain is mildly pleuritic.  She denies fever, cough, sore throat or rhinorrhea.  She feels more dyspneic than usual, but denies precordial chest pain, palpitations, dizziness, diaphoresis, PND, orthopnea.  She gets frequent lower extremity edema.  No abdominal pain, nausea or vomiting, diarrhea, constipation, melena or hematochezia.  Denies dysuria, frequency hematuria.  No polyuria, polydipsia, polyphagia or blurred vision.  ED Course: Initial vital signs temperature 90 F, pulse 60, respiration 18, blood pressure 123/70 mmHg and O2 sat 99% on room air.  The patient was given a Norco 5/325 tablet, started on a heparin continuous infusion and pharmacy gave 12 mg of warfarin.  Her CBC was 6.5, hemoglobin 9.7 g/dL and platelets 184.  PT was 21.9, INR 1.9 and d-dimer was 1.18.  Sodium 129, potassium 4.6, chloride 90 and CO2 30 mmol/L.  BUN was 50, creatinine 1.86 and glucose 114 mg/dL.  SARS coronavirus 2 was negative.  Her  chest radiograph shows increased retrocardiac and right basilar consolidation which may be atelectasis or edema.    Review of Systems: As per HPI otherwise 10 point review of systems negative.   Past Medical History:  Diagnosis Date  . Adenomatous polyp of colon 12/16/2002   Dr. [name]/[facility]  . Allergy   . Cataract   . CHB (complete heart block) ([place]) 10/07/2014      . CHF (congestive heart failure) ([place])    a.  EF previously reduced to 25-30% b. EF improved to 60-65% by echo in 10/2017.   Marland Kitchen Chronic kidney disease, stage I 2006  . Constipation       . COPD (chronic obstructive pulmonary disease) ([place])       . DJD (degenerative joint disease) of lumbar spine   . DM (diabetes mellitus) ([place]) 2006  . Esophageal reflux   . Glaucoma   . Gout       . H/O heart valve replacement with mechanical valve    a. s/p mechanical AVR in 2001 and mechanical MVR in 2004.  Marland Kitchen H/O wisdom tooth extraction   . Hyperlipemia   . IDA (iron deficiency anemia)    Parenteral iron/[name]  . Insomnia       . Non-ischemic cardiomyopathy ([place])    a. s/p MDT CRTD  . Obesity, unspecified   . Other and unspecified hyperlipidemia   . Oxygen deficiency 2014   nocturnal;  . Spondylolisthesis   . Spondylosis   . Thyroid cancer ([place])    remote thyroidectomy, no recurrence, pt denies  in 2016 thyroid cancer  . Type 2 diabetes mellitus with hyperglycemia ([place]) 07/15/2010  . Unspecified hypothyroidism       . 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 Vertigo         Past Surgical History:  Procedure Laterality Date  . AORTIC AND MITRAL VALVE REPLACEMENT  2001  . BI-VENTRICULAR IMPLANTABLE CARDIOVERTER DEFIBRILLATOR UPGRADE N/A 01/18/2015   Procedure: BI-VENTRICULAR IMPLANTABLE CARDIOVERTER DEFIBRILLATOR UPGRADE;  Surgeon: Evans Lance, MD;  Location[facility] CATH LAB;  Service: Cardiovascular;  Laterality: N/A;  . BIV ICD GENERATOR CHANGEOUT  N/A 01/30/2019   Procedure: BIV ICD GENERATOR CHANGEOUT;  Surgeon: Evans Lance, MD;  Location: [place] [facility] CV LAB;  Service: Cardiovascular;  Laterality: N/A;  . CARDIAC CATHETERIZATION    . CARDIAC VALVE REPLACEMENT    . CARDIOVERSION N/A 11/13/2016   Procedure: CARDIOVERSION;  Surgeon: Sanda Klein, MD;  Location: [facility] ENDOSCOPY;  Service: Cardiovascular;  Laterality: N/A;  . COLONOSCOPY  06/12/2007   [place]k- Normal rectum/Normal colon  . COLONOSCOPY  12/16/02   small hemorrhoids  . COLONOSCOPY  06/20/2012   Procedure: COLONOSCOPY;  Surgeon: Daneil Dolin, MD;  Location: [facility] ENDO SUITE;  Service: Endoscopy;  Laterality: N/A;  10:45  . defibrillator implanted 2006    . DOPPLER ECHOCARDIOGRAPHY  2012  . DOPPLER ECHOCARDIOGRAPHY  05,06,07,08,09,2011  . ESOPHAGOGASTRODUODENOSCOPY  06/12/2007   R[place]k- normal esophagus, small hiatal hernia, otherwise normal stomach, D1, D2  . EYE SURGERY Right 2005  . EYE SURGERY Left 2006  . ICD LEAD REMOVAL Left 02/08/2015   Procedure: ICD LEAD REMOVAL;  Surgeon: Evans Lance, MD;  Location: [facility] OR;  Service: Cardiovascular;  Laterality: Left;  "Will plan extraction and insertion of a BiV PM"  **Dr. Roxan Hockey backing up case**  . IMPLANTABLE CARDIOVERTER DEFIBRILLATOR (ICD) GENERATOR CHANGE Left 02/08/2015   Procedure: ICD GENERATOR CHANGE;  Surgeon: Evans Lance, MD;  Location: [place];  Service: Cardiovascular;  Laterality: Left;  . INSERT / REPLACE / REMOVE PACEMAKER    . PACEMAKER INSERTION  May 2016  . pacemaker placed  2004  . right breast cyst removed     benign   . right cataract removed     2005  . TEE WITHOUT CARDIOVERSION N/A 11/13/2016   Procedure: TRANSESOPHAGEAL ECHOCARDIOGRAM (TEE);  Surgeon: Sanda Klein, MD;  Location: [facility] ENDOSCOPY;  Service: Cardiovascular;  Laterality: N/A;  . THYROIDECTOMY       reports that she quit smoking about 32 years ago. Her smoking use included cigarettes. She has a 30.00 pack-year smoking history. She  has never used smokeless tobacco. She reports that she does not drink alcohol or use drugs.  Allergies  Allergen Reactions  . Penicillins Other (See Comments)    Bruise Did it involve swelling of the face/tongue/throat, SOB, or low BP? No Did it involve sudden or severe rash/hives, skin peeling, or any reaction on the inside of your mouth or nose? No Did you need to seek medical attention at a hospital or doctor's office? No When did it last happen?20+ years If all above answers are "NO", may proceed with cephalosporin use.   . Aspirin Other (See Comments)    On coumadin  . Fentanyl Dermatitis    Rash with patch   . Niacin Itching    Family History  Problem Relation Age of Onset  . Pancreatic cancer Mother   . Heart disease Father   . Heart disease Sister   . Heart attack  Sister   . Heart disease Brother   . Diabetes Brother   . Heart disease Brother   . Diabetes Brother   . Hypertension Brother   . Lung cancer Brother    Prior to Admission medications   Medication Sig Start Date End Date Taking? Authorizing Provider  acetaminophen (TYLENOL) 500 MG tablet Take 500 mg by mouth every 6 (six) hours as needed (pain).    [provider]  albuterol (PROVENTIL HFA;VENTOLIN HFA) 108 (90 Base) MCG/ACT inhaler INHALE 2 PUFFS INTO THE LUNGS EVERY 6 HOURS AS NEEDED FOR WHEEZING OR SHORTNESS OF BREATH Patient taking differently: Inhale 2 puffs into the lungs every 6 (six) hours as needed for wheezing or shortness of breath.  09/30/18   Fayrene Helper, MD  albuterol (PROVENTIL) (2.5 MG/3ML) 0.083% nebulizer solution INHALE 1 VIAL VIA NEBULIZER THREE TIMES DAILY. Patient taking differently: Take 2.5 mg by nebulization 2 (two) times daily.  12/28/16   Fayrene Helper, MD  allopurinol (ZYLOPRIM) 100 MG tablet TAKE 1 TABLET(100 MG) BY MOUTH DAILY 04/17/19   Fayrene Helper, MD  benzonatate (TESSALON) 100 MG capsule Take 1 capsule (100 mg total) by mouth 2 (two) times  daily as needed for cough. 04/28/19   Fayrene Helper, MD  BREO ELLIPTA 100-25 MCG/INH AEPB 1 TAKE 1 INHALATION BY MOUTH ONCE DAILY 05/20/19   Fayrene Helper, MD  cholecalciferol (VITAMIN D3) 25 MCG (1000 UT) tablet Take 1,000 Units by mouth daily.    [provider]  diphenhydramine-acetaminophen (TYLENOL PM) 25-500 MG TABS tablet Take 1 tablet by mouth at bedtime as needed (sleep).    [provider]  docusate sodium (COLACE) 100 MG capsule Take 300 mg by mouth at bedtime as needed for mild constipation.     [provider]  fluticasone (FLONASE) 50 MCG/ACT nasal spray SHAKE LIQUID AND USE 2 SPRAYS IN EACH NOSTRIL DAILY Patient taking differently: Place 2 sprays into both nostrils daily as needed for allergies.  12/23/18   Fayrene Helper, MD  folic acid (FOLVITE) 1 MG tablet Take 1 tablet (1 mg total) by mouth daily. Patient taking differently: Take 1 mg by mouth at bedtime.  09/30/18   Fayrene Helper, MD  furosemide (LASIX) 40 MG tablet Take 1 tablet (40 mg total) by mouth daily. Skip dose today and start from 1/4 05/23/19   Croitoru, Mihai, MD  gabapentin (NEURONTIN) 300 MG capsule TAKE 1 CAPSULE BY MOUTH AT BEDTIME 04/17/19   Fayrene Helper, MD  hydrALAZINE (APRESOLINE) 25 MG tablet Take 1 tablet (25 mg total) by mouth 2 (two) times daily. 05/19/19   Croitoru, Mihai, MD  HYDROcodone-acetaminophen (NORCO) 7.5-325 MG tablet Take 1 tablet by mouth every 6 (six) hours as needed for moderate pain (Must last 30 days.). 04/08/19   Sanjuana Kava, MD  isosorbide mononitrate (IMDUR) 30 MG 24 hr tablet Take 1 tablet (30 mg total) by mouth daily. 05/23/19 08/21/19  Croitoru, Mihai, MD  Liniments (SALONPAS PAIN RELIEF PATCH EX) Apply 1 patch topically daily as needed (pain).    [provider]  meclizine (ANTIVERT) 12.5 MG tablet Take 1 tablet (12.5 mg total) by mouth 3 (three) times daily as needed for dizziness. 12/10/18   Fayrene Helper, MD   metolazone (ZAROXOLYN) 2.5 MG tablet Take 1 tablet (2.5 mg total) by mouth 2 (two) times a week. Wednesdays and Saturdays Skip dose on 1/4 and start from 1/8 05/26/19   Croitoru, Mihai, MD  metoprolol succinate (TOPROL-XL)  100 MG 24 hr tablet TAKE 1 TABLET BY MOUTH DAILY. TAKE WITH OR IMMEDIATELY FOLLOWING A MEAL. 04/11/19   Fayrene Helper, MD  Multiple Vitamin (MULTIVITAMIN) LIQD Take 5 mLs by mouth daily.    [provider]  ondansetron (ZOFRAN) 4 MG tablet Take 1 tablet (4 mg total) by mouth daily as needed for nausea or vomiting. 10/09/18   Fayrene Helper, MD  Polyethyl [name] (SYSTANE) 0.4-0.3 % GEL ophthalmic gel Place 1 application into the left eye at bedtime.    [provider]  potassium chloride SA (K-DUR) 20 MEQ tablet TAKE 1 TABLET BY MOUTH DAILY Patient taking differently: Take 20 mEq by mouth daily.  01/24/19   Fayrene Helper, MD  pravastatin (PRAVACHOL) 40 MG tablet Take 1 tablet (40 mg total) by mouth every evening. 05/20/19   Fayrene Helper, MD  SYNTHROID 75 MCG tablet TAKE 1 TABLET BY MOUTH DAILY 04/24/19   Fayrene Helper, MD  warfarin (JANTOVEN) 5 MG tablet Take as directed by coumadin clinic 05/16/19   Croitoru, Dani Gobble, MD    Physical Exam: Vitals:   05/29/19 1808 05/29/19 1809 05/29/19 2002 05/29/19 2230  BP: 123/78  (!) 140/57 (!) 158/62  Pulse: 60  60 (!) 59  Resp: 18  20 (!) 27  Temp: 98 F (36.7 C)     TempSrc: Oral     SpO2: 99%  100% 100%  Weight:  87.5 kg    Height:  5\' 7"  (1.702 m)      Constitutional: NAD, calm, comfortable Eyes: PERRL, lids and conjunctivae normal ENMT: Mucous membranes are moist. Posterior pharynx clear of any exudate or lesions. Neck: normal, supple, no masses, no thyromegaly Respiratory: Decreased inspiratory effort, otherwise clear to auscultation bilaterally, no wheezing, no crackles. No accessory muscle use.  Cardiovascular: Regular rate and rhythm, 1/6 systolic murmur.  Positive  prosthetic valve click.  Rubs / gallops.  Trace lower extremities pitting edema. 2+ pedal pulses. No carotid bruits.  Abdomen: Obese, nondistended. Bowel sounds positive.   Soft, no tenderness, no masses palpated. No hepatosplenomegaly Musculoskeletal: no clubbing / cyanosis. Good ROM, no contractures. Normal muscle tone.  Skin: no rashes, lesions, ulcers on limited dermatological examination. Neurologic: CN 2-12 grossly intact. Sensation intact, DTR normal. Strength 5/5 in all 4.  Psychiatric: Normal judgment and insight. Alert and oriented x 3. Normal mood.   Labs on Admission: I have personally reviewed following labs and imaging studies  CBC: Recent Labs  Lab 05/29/19 2102  WBC 6.5  NEUTROABS 5.0  HGB 9.7*  HCT 30.5*  MCV 93.0  PLT 948   Basic Metabolic Panel: Recent Labs  Lab 05/29/19 2102  NA 129*  K 4.6  CL 90*  CO2 30  GLUCOSE 114*  BUN 50*  CREATININE 1.86*  CALCIUM 9.2   GFR: Estimated Creatinine Clearance: 25.1 mL/min (A) (by C-G formula based on SCr of 1.86 mg/dL (H)). Liver Function Tests: No results for input(s): AST, ALT, ALKPHOS, BILITOT, PROT, ALBUMIN in the last 168 hours. No results for input(s): LIPASE, AMYLASE in the last 168 hours. No results for input(s): AMMONIA in the last 168 hours. Coagulation Profile: Recent Labs  Lab 05/29/19 2102  INR 1.9*   Cardiac Enzymes: No results for input(s): CKTOTAL, CKMB, CKMBINDEX, TROPONINI in the last 168 hours. BNP (last 3 results) No results for input(s): PROBNP in the last 8760 hours. HbA1C: No results for input(s): HGBA1C in the last 72 hours. CBG: No results for input(s): GLUCAP in  the last 168 hours. Lipid Profile: No results for input(s): CHOL, HDL, LDLCALC, TRIG, CHOLHDL, LDLDIRECT in the last 72 hours. Thyroid Function Tests: No results for input(s): TSH, T4TOTAL, FREET4, T3FREE, THYROIDAB in the last 72 hours. Anemia Panel: No results for input(s): VITAMINB12, FOLATE, FERRITIN, TIBC, IRON,  RETICCTPCT in the last 72 hours. Urine analysis:    Component Value Date/Time   COLORURINE STRAW (A) 04/29/2019 1055   APPEARANCEUR CLEAR 04/29/2019 1055   LABSPEC 1.006 04/29/2019 1055   PHURINE 7.0 04/29/2019 1055   GLUCOSEU NEGATIVE 04/29/2019 1055   GLUCOSEU NEG mg/dL 03/22/2010 2313   HGBUR NEGATIVE 04/29/2019 1055   HGBUR negative 05/27/2010 1122   BILIRUBINUR NEGATIVE 04/29/2019 1055   BILIRUBINUR negative 04/28/2019 1500   BILIRUBINUR neg 03/11/2013 1443   KETONESUR NEGATIVE 04/29/2019 1055   PROTEINUR 30 (A) 04/29/2019 1055   UROBILINOGEN 0.2 04/28/2019 1500   UROBILINOGEN 0.2 03/27/2012 1201   NITRITE NEGATIVE 04/29/2019 1055   LEUKOCYTESUR NEGATIVE 04/29/2019 1055    Radiological Exams on Admission: Dg Chest 2 View  Result Date: 05/29/2019 CLINICAL DATA:  Patient with history of CHF. EXAM: CHEST - 2 VIEW COMPARISON:  Chest radiograph 03/20/2019 FINDINGS: Monitoring leads overlie the patient. Multi lead AICD device overlies the left hemithorax, leads are stable in position. Stable cardiomegaly status post median sternotomy. Aortic atherosclerosis. Interval increase in retrocardiac and right lung base consolidation. No pleural effusion or pneumothorax. Thoracic spine degenerative changes. IMPRESSION: Increased retrocardiac and right basilar consolidation which may represent atelectasis or edema. Electronically Signed   By: Lovey Newcomer M.D.   On: 05/29/2019 20:15   Echocardiogram 03/18/2019  IMPRESSIONS   1. A St. Jude valve is present in the aortic position. It is functioning normally.  2. A St. Jude valve is present in the mitral position. It is functioning normally.  3. The left ventricle has a visually estimated ejection fraction of 55%. The cavity size was normal. There is mild concentric left ventricular hypertrophy. Left ventricular diastolic Doppler parameters are indeterminate due to mitral valve  replacement/repair.  4. The aortic root is normal in size and  structure.  5. The tricuspid valve is grossly normal. Tricuspid valve regurgitation is moderate.  6. Trivial pericardial effusion is present.  7. Left atrial size was severely dilated.  8. Right atrial size was moderately dilated.  9. The inferior vena cava was dilated in size with >50% respiratory variability.  EKG: Independently reviewed.  Vent. rate 60 BPM PR interval * ms QRS duration 184 ms QT/QTc 540/540 ms P-R-T axes * 151 6 Atrial flutter Nonspecific intraventricular conduction delay paced, similar to prior 6/20  Assessment/Plan Principal Problem:   Dyspnea Observation/telemetry. Continue supplemental oxygen. Bronchodilators as needed. Check VQ scan in a.m. Continue anticoagulation until therapeutic/thromboembolic event ruled out.  Active Problems:   Hyperlipidemia LDL goal <70 Continue pravastatin 40 mg p.o. daily.    Essential hypertension Continue metoprolol, hydralazine and diuretics. Monitor BP, HR, renal function electrolytes.    S/P MVR (mitral valve replacement)   Chronic anticoagulation, on coumadin for Mechanical AVR and MVR Continue heparin and warfarin per pharmacy.    Chronic diastolic heart failure (HCC) No signs of decompensation at this time. Continue diuretic, beta-blocker, hydralazine and nitrates.    Hyponatremia Secondary to diuretic use. Monitor sodium level. Fluid restriction home might help.    Hypothyroidism Continue levothyroxine daily.    Gout Continue allopurinol.    Glaucoma Continue Systane drops.    Chronic anemia Monitor H&H.    GERD (gastroesophageal  reflux disease) Continue PPI.    DVT prophylaxis: Subtherapeutic INR on heparin infusion now. Code Status: Full code. Family Communication: Disposition Plan: Observation for anticoagulation optimization and VQ scan in a.m. Consults called: Admission status: Observation/telemetry.   Reubin Milan MD [facility]  If 7PM-7AM, please contact  night-coverage [email or web address]  05/30/2019, 12:24 AM   This document was prepared using Dragon voice recognition software and may contain some unintended transcription errors.

## 2019-05-30 NOTE — Progress Notes (Signed)
ANTICOAGULATION CONSULT NOTE -  Pharmacy Consult for heparin/warfarin Indication: pulmonary embolus/warfarin bridge/Hx of mechanical aortic valve replacement, and mitral valve replacement  Allergies  Allergen Reactions  . Penicillins Other (See Comments)    Bruise Did it involve swelling of the face/tongue/throat, SOB, or low BP? No Did it involve sudden or severe rash/hives, skin peeling, or any reaction on the inside of your mouth or nose? No Did you need to seek medical attention at a hospital or doctor's office? No When did it last happen?20+ years If all above answers are "NO", may proceed with cephalosporin use.   . Aspirin Other (See Comments)    On coumadin  . Fentanyl Dermatitis    Rash with patch   . Niacin Itching    Patient Measurements: Height: 5\' 7"  (170.2 cm) Weight: 194 lb 7.1 oz (88.2 kg) IBW/kg (Calculated) : 61.6 Heparin Dosing Weight: 80.2 kg  Vital Signs: Temp: 98.2 F (36.8 C) (09/04 1434) Temp Source: Oral (09/04 1434) BP: 129/54 (09/04 1434) Pulse Rate: 63 (09/04 1434)  Labs: Recent Labs    05/29/19 2102 05/30/19 0743 05/30/19 1552  HGB 9.7* 9.5*  --   HCT 30.5* 30.2*  --   PLT 184 166  --   LABPROT 21.9* 23.0*  --   INR 1.9* 2.1*  --   HEPARINUNFRC  --  0.67 0.58  CREATININE 1.86* 1.54*  --     Estimated Creatinine Clearance: 30.4 mL/min (A) (by C-G formula based on SCr of 1.54 mg/dL (H)).   Medical History: Past Medical History:  Diagnosis Date  . Adenomatous polyp of colon 12/16/2002   Dr. [name]/[facility]  . Allergy   . Cataract   . CHB (complete heart block) ([place]) 10/07/2014      . CHF (congestive heart failure) ([place])    a.  EF previously reduced to 25-30% b. EF improved to 60-65% by echo in 10/2017.   Marland Kitchen Chronic kidney disease, stage I 2006  . Constipation       . COPD (chronic obstructive pulmonary disease) ([place])       . DJD (degenerative joint disease) of lumbar spine   . DM  (diabetes mellitus) ([place]) 2006  . Esophageal reflux   . Glaucoma   . Gout       . H/O heart valve replacement with mechanical valve    a. s/p mechanical AVR in 2001 and mechanical MVR in 2004.  Marland Kitchen H/O wisdom tooth extraction   . Hyperlipemia   . IDA (iron deficiency anemia)    Parenteral iron/[name]  . Insomnia       . Non-ischemic cardiomyopathy ([place])    a. s/p MDT CRTD  . Obesity, unspecified   . Other and unspecified hyperlipidemia   . Oxygen deficiency 2014   nocturnal;  . Spondylolisthesis   . Spondylosis   . Thyroid cancer ([place])    remote thyroidectomy, no recurrence, pt denies in 2016 thyroid cancer  . Type 2 diabetes mellitus with hyperglycemia ([place]) 07/15/2010  . Unspecified hypothyroidism       . 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 Vertigo         Assessment: [name] [name] is a 74yoF with PMH of mechanical aortic valve and mitral valve replacement on chronic warfarin therapy.  Pharmacy consulted to dose warfarin and start heparin as a bridge in addition to suspicion for PE. Plan for VQ scan. Last anticoag visit was 05/21/2019 with therapeutic INR of  2.2.  Current INR 2.1 HL 0.58  PTA warfarin dose: 5 mg on Saturdays and 10 mg all other days. Last dose PTA was taken on 9/2.  Goal of Therapy:  Heparin level 0.3-0.7 units/ml INR 2-2.5 (reduced goal since 04/2017 d/t bruising) Monitor platelets by anticoagulation protocol: Yes   Plan: HL 0.58, will discontinue in am with therapeutic INR  Continue heparin infusion at 1300 units/hr Check anti-Xa level in 8 hours and daily while on heparin Continue heparin infusion until INR therapeutic for 24 hours.  Give warfarin 10 mg po x 1 Monitor daily INR, CBC, and s/s of bleeding   Thank you for allowing [place] to participate in this patients care.  Thomasenia Sales, PharmD, MBA, BCGP Clinical Pharmacist  05/30/2019 4:37 PM

## 2019-05-31 DIAGNOSIS — E039 Hypothyroidism, unspecified: Secondary | ICD-10-CM

## 2019-05-31 DIAGNOSIS — M546 Pain in thoracic spine: Secondary | ICD-10-CM

## 2019-05-31 DIAGNOSIS — Z952 Presence of prosthetic heart valve: Secondary | ICD-10-CM

## 2019-05-31 DIAGNOSIS — K219 Gastro-esophageal reflux disease without esophagitis: Secondary | ICD-10-CM

## 2019-05-31 DIAGNOSIS — I5033 Acute on chronic diastolic (congestive) heart failure: Secondary | ICD-10-CM

## 2019-05-31 DIAGNOSIS — I10 Essential (primary) hypertension: Secondary | ICD-10-CM

## 2019-05-31 DIAGNOSIS — E785 Hyperlipidemia, unspecified: Secondary | ICD-10-CM

## 2019-05-31 LAB — BASIC METABOLIC PANEL
Anion gap: 9 (ref 5–15)
BUN: 45 mg/dL — ABNORMAL HIGH (ref 8–23)
CO2: 31 mmol/L (ref 22–32)
Calcium: 9.1 mg/dL (ref 8.9–10.3)
Chloride: 92 mmol/L — ABNORMAL LOW (ref 98–111)
Creatinine, Ser: 1.48 mg/dL — ABNORMAL HIGH (ref 0.44–1.00)
GFR calc Af Amer: 37 mL/min — ABNORMAL LOW (ref >60)
GFR calc non Af Amer: 32 mL/min — ABNORMAL LOW (ref >60)
Glucose, Bld: 100 mg/dL — ABNORMAL HIGH (ref 70–99)
Potassium: 4.4 mmol/L (ref 3.5–5.1)
Sodium: 132 mmol/L — ABNORMAL LOW (ref 135–145)

## 2019-05-31 LAB — CBC
HCT: 32.7 % — ABNORMAL LOW (ref 36.0–46.0)
Hemoglobin: 10.3 g/dL — ABNORMAL LOW (ref 12.0–15.0)
MCH: 29.8 pg (ref 26.0–34.0)
MCHC: 31.5 g/dL (ref 30.0–36.0)
MCV: 94.5 fL (ref 80.0–100.0)
Platelets: 198 10*3/uL (ref 150–400)
RBC: 3.46 MIL/uL — ABNORMAL LOW (ref 3.87–5.11)
RDW: 16 % — ABNORMAL HIGH (ref 11.5–15.5)
WBC: 5.1 10*3/uL (ref 4.0–10.5)
nRBC: 0 % (ref 0.0–0.2)

## 2019-05-31 LAB — PROTIME-INR
INR: 2.3 — ABNORMAL HIGH (ref 0.8–1.2)
Prothrombin Time: 24.8 seconds — ABNORMAL HIGH (ref 11.4–15.2)

## 2019-05-31 LAB — HEPARIN LEVEL (UNFRACTIONATED): Heparin Unfractionated: 0.87 IU/mL — ABNORMAL HIGH (ref 0.30–0.70)

## 2019-05-31 LAB — GLUCOSE, CAPILLARY
Glucose-Capillary: 99 mg/dL (ref 70–99)
Glucose-Capillary: 115 mg/dL — ABNORMAL HIGH (ref 70–99)

## 2019-05-31 MED ORDER — FUROSEMIDE 40 MG PO TABS: 40.0000 mg | ORAL_TABLET | Freq: Every day | ORAL | Status: DC

## 2019-05-31 MED ORDER — WARFARIN SODIUM 5 MG PO TABS: 10.0000 mg | ORAL_TABLET | Freq: Once | ORAL | Status: DC

## 2019-05-31 MED ORDER — WARFARIN SODIUM 5 MG PO TABS: 5.0000 mg | ORAL_TABLET | Freq: Once | ORAL | Status: DC

## 2019-05-31 MED ORDER — GABAPENTIN 300 MG PO CAPS: 300.0000 mg | ORAL_CAPSULE | Freq: Two times a day (BID) | ORAL | 2 refills | Status: DC

## 2019-05-31 MED ORDER — PANTOPRAZOLE SODIUM 40 MG PO TBEC: 40.0000 mg | DELAYED_RELEASE_TABLET | Freq: Every day | ORAL | 1 refills | Status: DC

## 2019-05-31 NOTE — TOC Transition Note (Addendum)
Transition of Care [facility]) - CM/SW Discharge Note   Patient Details  Name: [name] [name] MRN: [id number] Date of Birth: Oct 13, 1931  [facility]) CM/SW Contact:  Latanya Maudlin, RN Phone Number: 05/31/2019, 2:55 PM   Clinical Narrative:   Patient to be discharged per MD order. Orders in place for home health services. CMS Medicare.gov Compare Post Acute Care list reviewed with patient and she is active with [facility] home [facility]. Notified CAssie of discharge.   Final next level of care: Home w Home Health Services Barriers to Discharge: No Barriers Identified   Patient Goals and CMS Choice   CMS Medicare.gov Compare Post Acute Care list provided to:: Patient Choice offered to / list presented to : Patient  Discharge Placement                       Discharge Plan and Services                          HH Arranged: RN, PT, Nurse's Aide [facility] Agency: [place] ([place]) Date [place]: 05/31/19 Time [place]: 1455 Representative spoke with at [place]: [place] ([place]) Interventions     Readmission Risk Interventions Readmission Risk Prevention Plan 05/31/2019  Transportation Screening Complete  PCP or Specialist Appt within 3-5 Days Complete  HRI or [place] Complete  Social Work Consult for [place] Planning/Counseling Complete  Palliative Care Screening Complete  Medication Review Press photographer) Complete  Some recent data might be hidden

## 2019-05-31 NOTE — Discharge Summary (Signed)
Physician Discharge Summary  [name] [name] PNT:[id number] DOB: 1931-11-08 DOA: 05/29/2019  PCP: Fayrene Helper, MD  Admit date: 05/29/2019 Discharge date: 05/31/2019  Time spent: 35 minutes  Recommendations for Outpatient Follow-up:  1. Repeat basic metabolic panel to follow electrolytes and renal function 2. Reassess vital signs and further adjust antihypertensive regimen as needed. 3. Close follow-up of the patient's INR with adjustment of her Coumadin therapy as required.   Discharge Diagnoses:  Principal Problem:   Dyspnea Active Problems:   Hyperlipidemia LDL goal <70   Essential hypertension   S/P MVR (mitral valve replacement)   Chronic anticoagulation, on coumadin for Mechanical AVR and MVR   Acute on chronic diastolic CHF (congestive heart failure) (HCC)   Hyponatremia   Hypothyroidism   Gout   Glaucoma   GERD (gastroesophageal reflux disease)   Anemia   Acute right-sided thoracic back pain   Discharge Condition: Stable and improved.  Patient discharged home with instruction to follow-up with PCP in 10 days.  Diet recommendation: Heart healthy/low-sodium diet  Filed Weights   05/29/19 1809 05/30/19 1632 05/31/19 0422  Weight: 87.5 kg 88.2 kg 88.3 kg    History of present illness:  As per H&P written by Dr. Olevia Bowens on 05/30/2019 85 y.o. female with medical history significant of colon polyps, allergies, cataracts, history of CHF with normalized EF on recent echo, stage III CKD, constipation, GERD, COPD, lumbar spine DJD, type 2 diabetes, glaucoma, gout, history of AVR in 2001 and mechanical MVR in 2004, hyperlipidemia, iron deficiency anemia history of thyroid cancer with thyroidectomy, vertigo who is coming to the emergency department due to progressively worse right shoulder pain for the past 3 days.  Patient states that the onset was insidious, but she has been having progressively worse pain since then.  She has had some pain in her left shoulder as well.  Pain is  not elicited by palpation or percussion.  However, the pain is mildly pleuritic.  She denies fever, cough, sore throat or rhinorrhea.  She feels more dyspneic than usual, but denies precordial chest pain, palpitations, dizziness, diaphoresis, PND, orthopnea.  She gets frequent lower extremity edema.  No abdominal pain, nausea or vomiting, diarrhea, constipation, melena or hematochezia.  Denies dysuria, frequency hematuria.  No polyuria, polydipsia, polyphagia or blurred vision.  ED Course: Initial vital signs temperature 90 F, pulse 60, respiration 18, blood pressure 123/70 mmHg and O2 sat 99% on room air.  The patient was given a Norco 5/325 tablet, started on a heparin continuous infusion and pharmacy gave 12 mg of warfarin.  Her CBC was 6.5, hemoglobin 9.7 g/dL and platelets 184.  PT was 21.9, INR 1.9 and d-dimer was 1.18.  Sodium 129, potassium 4.6, chloride 90 and CO2 30 mmol/L.  BUN was 50, creatinine 1.86 and glucose 114 mg/dL.  SARS coronavirus 2 was negative.  Her chest radiograph shows increased retrocardiac and right basilar consolidation which may be atelectasis or edema.    Hospital Course:  1-Dyspnea in the setting of mild acute on chronic diastolic heart failure -IV Lasix given -Significant improvement in her respiratory status after increased urine output overnight -Renal function has remained stable along with electrolytes -Patient breathing is back to her baseline. -Further work-up for dyspnea demonstrated a low probability VQ scan for pulmonary embolism. -CXR negative for infiltrates/opacities to suggest infection. -neg COVID -Advised to follow heart healthy diet and to check her weight on daily basis. -Will continue as needed bronchodilators and resume home dose diuretics, beta-blockers, hydralazine and  nitrates.  2-hyperlipidemia -Continue Pravachol  3-essential hypertension -Is stable and well-controlled overall-continue current antihypertensive regimen -Heart healthy  diet has been encouraged.  4-s/p Aortic and mitral valve replacement (mechanical valve) -INR was subtherapeutic on admission and patient was bridged with IV heparin -At discharge INR 2.3.  5-GERD -continue PPI  6-chronic respiratory failure due to COPD -resume Nebulizer/inhaler -Continue chronic oxygen supplementation.  7-hypothyroidism -Continue Synthroid  8-CKD stage 3 -Stable and at baseline -Creatinine at discharge 1.48 -Patient advised to maintain adequate hydration -Repeat basic medical panel follow-up visit to reassess renal function and stability.  *The rest of her medical problems has remained stable and plan is to continue current medication regimen with outpatient follow-up with PCP for further adjustment.  Procedures:  V/Q scan: Demonstrating low probability for PE  Consultations:  None  Discharge Exam: Vitals:   05/31/19 0928 05/31/19 0947  BP:  (!) 116/50  Pulse:    Resp:    Temp:    SpO2: 98%     General: Afebrile, no chest pain, no nausea, no vomiting.  Reports breathing is back to baseline.  Good oxygen saturation on chronic supplementation. Cardiovascular: RRR, positive systolic murmur, no rubs, no gallops. Respiratory: Normal respiratory effort, no using accessory muscles.  Positive rhonchi on exam, no crackles, no wheezing. Abdomen: Soft, nontender, nondistended, positive bowel sounds Extremities: No cyanosis, no clubbing.  Discharge Instructions   Discharge Instructions    (HEART FAILURE PATIENTS) Call MD:  Anytime you have any of the following symptoms: 1) 3 pound weight gain in 24 hours or 5 pounds in 1 week 2) shortness of breath, with or without a dry hacking cough 3) swelling in the hands, feet or stomach 4) if you have to sleep on extra pillows at night in order to breathe.   Complete by: As directed    Diet - low sodium heart healthy   Complete by: As directed    Discharge instructions   Complete by: As directed    Take medications  as prescribed Maintain adequate hydration Follow low-sodium diet Check your weight on daily basis Arrange follow-up with PCP in 10 days.     Allergies as of 05/31/2019      Reactions   Penicillins Other (See Comments)   Bruise Did it involve swelling of the face/tongue/throat, SOB, or low BP? No Did it involve sudden or severe rash/hives, skin peeling, or any reaction on the inside of your mouth or nose? No Did you need to seek medical attention at a hospital or doctor's office? No When did it last happen?20+ years If all above answers are "NO", may proceed with cephalosporin use.   Aspirin Other (See Comments)   On coumadin   Fentanyl Dermatitis   Rash with patch   Niacin Itching      Medication List    TAKE these medications   acetaminophen 500 MG tablet Commonly known as: TYLENOL Take 500 mg by mouth every 6 (six) hours as needed (pain).   albuterol (2.5 MG/3ML) 0.083% nebulizer solution Commonly known as: PROVENTIL INHALE 1 VIAL VIA NEBULIZER THREE TIMES DAILY. What changed: See the new instructions.   albuterol 108 (90 Base) MCG/ACT inhaler Commonly known as: VENTOLIN HFA INHALE 2 PUFFS INTO THE LUNGS EVERY 6 HOURS AS NEEDED FOR WHEEZING OR SHORTNESS OF BREATH What changed:   how much to take  how to take this  when to take this  reasons to take this  additional instructions   allopurinol 100 MG tablet Commonly known  as: ZYLOPRIM TAKE 1 TABLET(100 MG) BY MOUTH DAILY   Breo Ellipta 100-25 MCG/INH Aepb Generic drug: fluticasone furoate-vilanterol 1 TAKE 1 INHALATION BY MOUTH ONCE DAILY   cholecalciferol 25 MCG (1000 UT) tablet Commonly known as: VITAMIN D3 Take 1,000 Units by mouth daily.   diphenhydramine-acetaminophen 25-500 MG Tabs tablet Commonly known as: TYLENOL PM Take 1 tablet by mouth at bedtime as needed (sleep).   docusate sodium 100 MG capsule Commonly known as: COLACE Take 300 mg by mouth at bedtime as needed for mild  constipation.   fluticasone 50 MCG/ACT nasal spray Commonly known as: FLONASE SHAKE LIQUID AND USE 2 SPRAYS IN EACH NOSTRIL DAILY What changed: See the new instructions.   folic acid 1 MG tablet Commonly known as: FOLVITE Take 1 tablet (1 mg total) by mouth daily. What changed: when to take this   furosemide 40 MG tablet Commonly known as: LASIX Take 1 tablet (40 mg total) by mouth daily. What changed: additional instructions   gabapentin 300 MG capsule Commonly known as: NEURONTIN Take 1 capsule (300 mg total) by mouth 2 (two) times daily. What changed: when to take this   hydrALAZINE 25 MG tablet Commonly known as: APRESOLINE Take 1 tablet (25 mg total) by mouth 2 (two) times daily.   HYDROcodone-acetaminophen 7.5-325 MG tablet Commonly known as: Norco Take 1 tablet by mouth every 6 (six) hours as needed for moderate pain (Must last 30 days.).   isosorbide mononitrate 30 MG 24 hr tablet Commonly known as: IMDUR Take 1 tablet (30 mg total) by mouth daily.   meclizine 12.5 MG tablet Commonly known as: ANTIVERT Take 1 tablet (12.5 mg total) by mouth 3 (three) times daily as needed for dizziness.   metolazone 2.5 MG tablet Commonly known as: ZAROXOLYN Take 1 tablet (2.5 mg total) by mouth 2 (two) times a week. Wednesdays and Saturdays Skip dose on 1/4 and start from 1/8   metoprolol succinate 100 MG 24 hr tablet Commonly known as: TOPROL-XL TAKE 1 TABLET BY MOUTH DAILY. TAKE WITH OR IMMEDIATELY FOLLOWING A MEAL.   multivitamin Liqd Take 5 mLs by mouth daily.   ondansetron 4 MG tablet Commonly known as: ZOFRAN Take 1 tablet (4 mg total) by mouth daily as needed for nausea or vomiting.   pantoprazole 40 MG tablet Commonly known as: PROTONIX Take 1 tablet (40 mg total) by mouth daily. Start taking on: June 01, 2019   potassium chloride SA 20 MEQ tablet Commonly known as: K-DUR TAKE 1 TABLET BY MOUTH DAILY   pravastatin 40 MG tablet Commonly known as:  PRAVACHOL Take 1 tablet (40 mg total) by mouth every evening.   SALONPAS PAIN RELIEF PATCH EX Apply 1 patch topically daily as needed (pain).   Synthroid 75 MCG tablet Generic drug: levothyroxine TAKE 1 TABLET BY MOUTH DAILY   Systane 0.4-0.3 % Gel ophthalmic gel Generic drug: Polyethyl [name] Place 1 application into the left eye at bedtime.   warfarin 5 MG tablet Commonly known as: Jantoven Take as directed. If you are unsure how to take this medication, talk to your nurse or doctor. Original instructions: Take as directed by coumadin clinic      Allergies  Allergen Reactions  . Penicillins Other (See Comments)    Bruise Did it involve swelling of the face/tongue/throat, SOB, or low BP? No Did it involve sudden or severe rash/hives, skin peeling, or any reaction on the inside of your mouth or nose? No Did you need to seek medical  attention at a hospital or doctor's office? No When did it last happen?20+ years If all above answers are "NO", may proceed with cephalosporin use.   . Aspirin Other (See Comments)    On coumadin  . Fentanyl Dermatitis    Rash with patch   . Niacin Itching   Follow-up Information    Fayrene Helper, MD. Schedule an appointment as soon as possible for a visit in 10 day(s).   Specialty: Family Medicine Contact information: [address], [place] [address] [phone]        Sanda Klein, MD .   Specialty: Cardiology Contact information: [address] [place] [address] [phone]           The results of significant diagnostics from this hospitalization (including imaging, microbiology, ancillary and laboratory) are listed below for reference.    Significant Diagnostic Studies: Dg Chest 2 View  Result Date: 05/29/2019 CLINICAL DATA:  Patient with history of CHF. EXAM: CHEST - 2 VIEW COMPARISON:  Chest radiograph 03/20/2019 FINDINGS: Monitoring leads overlie the  patient. Multi lead AICD device overlies the left hemithorax, leads are stable in position. Stable cardiomegaly status post median sternotomy. Aortic atherosclerosis. Interval increase in retrocardiac and right lung base consolidation. No pleural effusion or pneumothorax. Thoracic spine degenerative changes. IMPRESSION: Increased retrocardiac and right basilar consolidation which may represent atelectasis or edema. Electronically Signed   By: Lovey Newcomer M.D.   On: 05/29/2019 20:15   Nm Pulmonary Perf And Vent  Result Date: 05/30/2019 CLINICAL DATA:  Dyspnea, elevated D-dimer, shortness of breath EXAM: NUCLEAR MEDICINE VENTILATION - PERFUSION LUNG SCAN TECHNIQUE: Ventilation images were obtained in multiple projections using inhaled aerosol Tc-68m DTPA. Perfusion images were obtained in multiple projections after intravenous injection of Tc-56m MAA. RADIOPHARMACEUTICALS:  29.8 mCi of Tc-57m DTPA aerosol inhalation and 1.5 mCi Tc45m MAA IV COMPARISON:  12/10/2017 Correlation: Chest radiograph 05/29/2019 FINDINGS: Ventilation: Diminished ventilation LEFT lower lobe. Remaining ventilation normal Perfusion: Matching diminished perfusion LEFT lower lobe. No additional segmental or subsegmental perfusion defects. Chest radiograph: Enlargement of cardiac silhouette post by valvular replacement and AICD. Atelectasis RIGHT lower lobe, minimally LEFT lower lobe, less than suggested by scintigraphy. IMPRESSION: Matching diminished ventilation and perfusion in the LEFT lower lobe. Bibasilar atelectasis on chest radiograph greater on RIGHT, minimal LEFT. Low probability of pulmonary embolism. Electronically Signed   By: Lavonia Dana M.D.   On: 05/30/2019 14:18    Microbiology: Recent Results (from the past 240 hour(s))  SARS Coronavirus 2 [facility] order, Performed in [facility] hospital lab) Nasopharyngeal Nasopharyngeal Swab     Status: None   Collection Time: 05/29/19 11:08 PM   Specimen: Nasopharyngeal Swab   Result Value Ref Range Status   SARS Coronavirus 2 NEGATIVE NEGATIVE Final    Comment: (NOTE) If result is NEGATIVE SARS-CoV-2 target nucleic acids are NOT DETECTED. The SARS-CoV-2 RNA is generally detectable in upper and lower  respiratory specimens during the acute phase of infection. The lowest  concentration of SARS-CoV-2 viral copies this assay can detect is 250  copies / mL. A negative result does not preclude SARS-CoV-2 infection  and should not be used as the sole basis for treatment or other  patient management decisions.  A negative result may occur with  improper specimen collection / handling, submission of specimen other  than nasopharyngeal swab, presence of viral mutation(s) within the  areas targeted by this assay, and inadequate number of viral copies  (<250 copies / mL).  A negative result must be combined with clinical  observations, patient history, and epidemiological information. If result is POSITIVE SARS-CoV-2 target nucleic acids are DETECTED. The SARS-CoV-2 RNA is generally detectable in upper and lower  respiratory specimens dur ing the acute phase of infection.  Positive  results are indicative of active infection with SARS-CoV-2.  Clinical  correlation with patient history and other diagnostic information is  necessary to determine patient infection status.  Positive results do  not rule out bacterial infection or co-infection with other viruses. If result is PRESUMPTIVE POSTIVE SARS-CoV-2 nucleic acids MAY BE PRESENT.   A presumptive positive result was obtained on the submitted specimen  and confirmed on repeat testing.  While 2019 novel coronavirus  (SARS-CoV-2) nucleic acids may be present in the submitted sample  additional confirmatory testing may be necessary for epidemiological  and / or clinical management purposes  to differentiate between  SARS-CoV-2 and other Sarbecovirus currently known to infect humans.  If clinically indicated additional  testing with an alternate test  methodology [phone]) is advised. The SARS-CoV-2 RNA is generally  detectable in upper and lower respiratory sp ecimens during the acute  phase of infection. The expected result is Negative. Fact Sheet for Patients:  StrictlyIdeas.no Fact Sheet for Healthcare Providers: BankingDealers.co.za This test is not yet approved or cleared by the [place] FDA and has been authorized for detection and/or diagnosis of SARS-CoV-2 by FDA under an Emergency Use Authorization (EUA).  This EUA will remain in effect (meaning this test can be used) for the duration of the COVID-19 declaration under Section 564(b)(1) of the Act, 21 U.S.C. section 360bbb-3(b)(1), unless the authorization is terminated or revoked sooner. Performed at [facility], [address], [place], [place] [address]      Labs: Basic Metabolic Panel: Recent Labs  Lab 05/29/19 2102 05/30/19 0743 05/31/19 0601  NA 129* 131* 132*  K 4.6 4.2 4.4  CL 90* 91* 92*  CO2 30 30 31   GLUCOSE 114* 94 100*  BUN 50* 47* 45*  CREATININE 1.86* 1.54* 1.48*  CALCIUM 9.2 9.0 9.1   CBC: Recent Labs  Lab 05/29/19 2102 05/30/19 0743 05/31/19 0601  WBC 6.5 5.3 5.1  NEUTROABS 5.0  --   --   HGB 9.7* 9.5* 10.3*  HCT 30.5* 30.2* 32.7*  MCV 93.0 94.1 94.5  PLT 184 166 198   BNP (last 3 results) Recent Labs    07/30/18 1204 09/26/18 2330 03/20/19 1842  BNP 320.0* 368.0* 497.0*   CBG: Recent Labs  Lab 05/30/19 1111 05/30/19 1613 05/30/19 1958 05/31/19 0718 05/31/19 1122  GLUCAP 104* 130* 87 99 115*   Signed:  Barton Dubois MD.  [facility] 05/31/2019, 2:09 PM

## 2019-05-31 NOTE — Progress Notes (Addendum)
ANTICOAGULATION CONSULT NOTE -  Pharmacy Consult for warfarin Indication: pulmonary embolus/warfarin bridge/Hx of mechanical aortic valve replacement, and mitral valve replacement  Allergies  Allergen Reactions  . Penicillins Other (See Comments)    Bruise Did it involve swelling of the face/tongue/throat, SOB, or low BP? No Did it involve sudden or severe rash/hives, skin peeling, or any reaction on the inside of your mouth or nose? No Did you need to seek medical attention at a hospital or doctor's office? No When did it last happen?20+ years If all above answers are "NO", may proceed with cephalosporin use.   . Aspirin Other (See Comments)    On coumadin  . Fentanyl Dermatitis    Rash with patch   . Niacin Itching    Patient Measurements: Height: 5\' 7"  (170.2 cm) Weight: 194 lb 10.7 oz (88.3 kg) IBW/kg (Calculated) : 61.6 Heparin Dosing Weight: 80.2 kg  Vital Signs: Temp: 98.8 F (37.1 C) (09/05 0451) Temp Source: Oral (09/05 0451) BP: 129/43 (09/05 0451) Pulse Rate: 60 (09/05 0451)  Labs: Recent Labs    05/29/19 2102 05/30/19 0743 05/30/19 1552 05/31/19 0601  HGB 9.7* 9.5*  --  10.3*  HCT 30.5* 30.2*  --  32.7*  PLT 184 166  --  198  LABPROT 21.9* 23.0*  --  24.8*  INR 1.9* 2.1*  --  2.3*  HEPARINUNFRC  --  0.67 0.58 0.87*  CREATININE 1.86* 1.54*  --  1.48*    Estimated Creatinine Clearance: 31.7 mL/min (A) (by C-G formula based on SCr of 1.48 mg/dL (H)).   Medical History: Past Medical History:  Diagnosis Date  . Adenomatous polyp of colon 12/16/2002   Dr. [name]/[facility]  . Allergy   . Cataract   . CHB (complete heart block) ([place]) 10/07/2014      . CHF (congestive heart failure) ([place])    a.  EF previously reduced to 25-30% b. EF improved to 60-65% by echo in 10/2017.   Marland Kitchen Chronic kidney disease, stage I 2006  . Constipation       . COPD (chronic obstructive pulmonary disease) ([place])       . DJD  (degenerative joint disease) of lumbar spine   . DM (diabetes mellitus) ([place]) 2006  . Esophageal reflux   . Glaucoma   . Gout       . H/O heart valve replacement with mechanical valve    a. s/p mechanical AVR in 2001 and mechanical MVR in 2004.  Marland Kitchen H/O wisdom tooth extraction   . Hyperlipemia   . IDA (iron deficiency anemia)    Parenteral iron/[name]  . Insomnia       . Non-ischemic cardiomyopathy ([place])    a. s/p MDT CRTD  . Obesity, unspecified   . Other and unspecified hyperlipidemia   . Oxygen deficiency 2014   nocturnal;  . Spondylolisthesis   . Spondylosis   . Thyroid cancer ([place])    remote thyroidectomy, no recurrence, pt denies in 2016 thyroid cancer  . Type 2 diabetes mellitus with hyperglycemia ([place]) 07/15/2010  . Unspecified hypothyroidism       . 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 Vertigo         Assessment: [name] [name] is a 58yoF with PMH of mechanical aortic valve and mitral valve replacement on chronic warfarin therapy.  Pharmacy consulted to dose warfarin and start heparin as a bridge in addition to suspicion for PE. Plan for VQ scan.  Last anticoag visit was 05/21/2019 with therapeutic INR of 2.2.  Current INR 2.3 HL 0.87  PTA warfarin dose: 5 mg on Saturdays and 10 mg all other days. Last dose PTA was taken on 9/2.  Goal of Therapy:  Heparin level 0.3-0.7 units/ml INR 2-2.5 (reduced goal since 04/2017 d/t bruising) Monitor platelets by anticoagulation protocol: Yes   Plan: Stopping heparin per Dr Dyann Kief, HL 0.87 supratherapeutic.   Give warfarin 5 mg po x 1 Monitor daily INR, CBC, and s/s of bleeding   Thank you for allowing [place] to participate in this patients care.  Thomasenia Sales, PharmD, MBA, BCGP Clinical Pharmacist  05/31/2019 8:49 AM

## 2019-06-01 DIAGNOSIS — J440 Chronic obstructive pulmonary disease with acute lower respiratory infection: Secondary | ICD-10-CM | POA: Diagnosis not present

## 2019-06-01 DIAGNOSIS — J209 Acute bronchitis, unspecified: Secondary | ICD-10-CM | POA: Diagnosis not present

## 2019-06-01 DIAGNOSIS — M1711 Unilateral primary osteoarthritis, right knee: Secondary | ICD-10-CM | POA: Diagnosis not present

## 2019-06-01 DIAGNOSIS — I130 Hypertensive heart and chronic kidney disease with heart failure and stage 1 through stage 4 chronic kidney disease, or unspecified chronic kidney disease: Secondary | ICD-10-CM | POA: Diagnosis not present

## 2019-06-01 DIAGNOSIS — E1122 Type 2 diabetes mellitus with diabetic chronic kidney disease: Secondary | ICD-10-CM | POA: Diagnosis not present

## 2019-06-01 DIAGNOSIS — R262 Difficulty in walking, not elsewhere classified: Secondary | ICD-10-CM | POA: Diagnosis not present

## 2019-06-03 NOTE — Telephone Encounter (Signed)
Patient said the hosp increased it for her and its doing better

## 2019-06-03 NOTE — Telephone Encounter (Signed)
Her prescription states 300 mg one TWO times daily, is she taking like that, if not needs to if already doing this I will increase to 3 times daily

## 2019-06-03 NOTE — Telephone Encounter (Signed)
States 1 gabapentin is not working and wants to increase. Jarrett Soho recommended a visit but pt was just here 8/25 and said she cannot get back in for a visit) Please advise

## 2019-06-04 ENCOUNTER — Ambulatory Visit: Payer: Medicare Other | Admitting: Family Medicine

## 2019-06-04 ENCOUNTER — Other Ambulatory Visit: Payer: Self-pay

## 2019-06-04 DIAGNOSIS — F329 Major depressive disorder, single episode, unspecified: Secondary | ICD-10-CM | POA: Diagnosis not present

## 2019-06-04 DIAGNOSIS — R262 Difficulty in walking, not elsewhere classified: Secondary | ICD-10-CM | POA: Diagnosis not present

## 2019-06-04 DIAGNOSIS — Z7901 Long term (current) use of anticoagulants: Secondary | ICD-10-CM | POA: Diagnosis not present

## 2019-06-04 DIAGNOSIS — M6281 Muscle weakness (generalized): Secondary | ICD-10-CM | POA: Diagnosis not present

## 2019-06-04 DIAGNOSIS — Z9981 Dependence on supplemental oxygen: Secondary | ICD-10-CM | POA: Diagnosis not present

## 2019-06-04 DIAGNOSIS — I130 Hypertensive heart and chronic kidney disease with heart failure and stage 1 through stage 4 chronic kidney disease, or unspecified chronic kidney disease: Secondary | ICD-10-CM | POA: Diagnosis not present

## 2019-06-04 DIAGNOSIS — N183 Chronic kidney disease, stage 3 (moderate): Secondary | ICD-10-CM | POA: Diagnosis not present

## 2019-06-04 DIAGNOSIS — Z95810 Presence of automatic (implantable) cardiac defibrillator: Secondary | ICD-10-CM | POA: Diagnosis not present

## 2019-06-04 DIAGNOSIS — J440 Chronic obstructive pulmonary disease with acute lower respiratory infection: Secondary | ICD-10-CM | POA: Diagnosis not present

## 2019-06-04 DIAGNOSIS — I5033 Acute on chronic diastolic (congestive) heart failure: Secondary | ICD-10-CM | POA: Diagnosis not present

## 2019-06-04 DIAGNOSIS — M1711 Unilateral primary osteoarthritis, right knee: Secondary | ICD-10-CM | POA: Diagnosis not present

## 2019-06-04 DIAGNOSIS — I8393 Asymptomatic varicose veins of bilateral lower extremities: Secondary | ICD-10-CM | POA: Diagnosis not present

## 2019-06-04 DIAGNOSIS — I442 Atrioventricular block, complete: Secondary | ICD-10-CM | POA: Diagnosis not present

## 2019-06-04 DIAGNOSIS — Z950 Presence of cardiac pacemaker: Secondary | ICD-10-CM | POA: Diagnosis not present

## 2019-06-05 ENCOUNTER — Ambulatory Visit (HOSPITAL_COMMUNITY): Payer: Medicare Other

## 2019-06-05 ENCOUNTER — Inpatient Hospital Stay (HOSPITAL_COMMUNITY): Payer: Medicare Other

## 2019-06-06 ENCOUNTER — Ambulatory Visit (INDEPENDENT_AMBULATORY_CARE_PROVIDER_SITE_OTHER): Payer: Medicare Other | Admitting: *Deleted

## 2019-06-06 DIAGNOSIS — I5033 Acute on chronic diastolic (congestive) heart failure: Secondary | ICD-10-CM | POA: Diagnosis not present

## 2019-06-06 DIAGNOSIS — J440 Chronic obstructive pulmonary disease with acute lower respiratory infection: Secondary | ICD-10-CM | POA: Diagnosis not present

## 2019-06-06 DIAGNOSIS — N183 Chronic kidney disease, stage 3 (moderate): Secondary | ICD-10-CM | POA: Diagnosis not present

## 2019-06-06 DIAGNOSIS — R262 Difficulty in walking, not elsewhere classified: Secondary | ICD-10-CM | POA: Diagnosis not present

## 2019-06-06 DIAGNOSIS — I130 Hypertensive heart and chronic kidney disease with heart failure and stage 1 through stage 4 chronic kidney disease, or unspecified chronic kidney disease: Secondary | ICD-10-CM | POA: Diagnosis not present

## 2019-06-06 DIAGNOSIS — Z952 Presence of prosthetic heart valve: Secondary | ICD-10-CM

## 2019-06-06 DIAGNOSIS — Z5181 Encounter for therapeutic drug level monitoring: Secondary | ICD-10-CM

## 2019-06-06 DIAGNOSIS — M1711 Unilateral primary osteoarthritis, right knee: Secondary | ICD-10-CM | POA: Diagnosis not present

## 2019-06-06 LAB — POCT INR: INR: 3.9 — AB (ref 2.0–3.0)

## 2019-06-06 NOTE — Patient Instructions (Signed)
Hold coumadin tonight then decrease dose to 2 tablets daily except 1 tablet on Tuesdays and Saturdays.  Started on Protonix Continue greens  Recheck in 1 week Order given to [employer] while in pt's home

## 2019-06-07 IMAGING — DX DG CHEST 2V
2 series · 2 of 2 positions shown · non-contrast
Comparison: Chest x-ray of 12/09/2017, 11/01/2017, and CT chest of
11/16/2016

CLINICAL DATA: Short of breath for several days

EXAM:
CHEST - 2 VIEW

[chest pa]
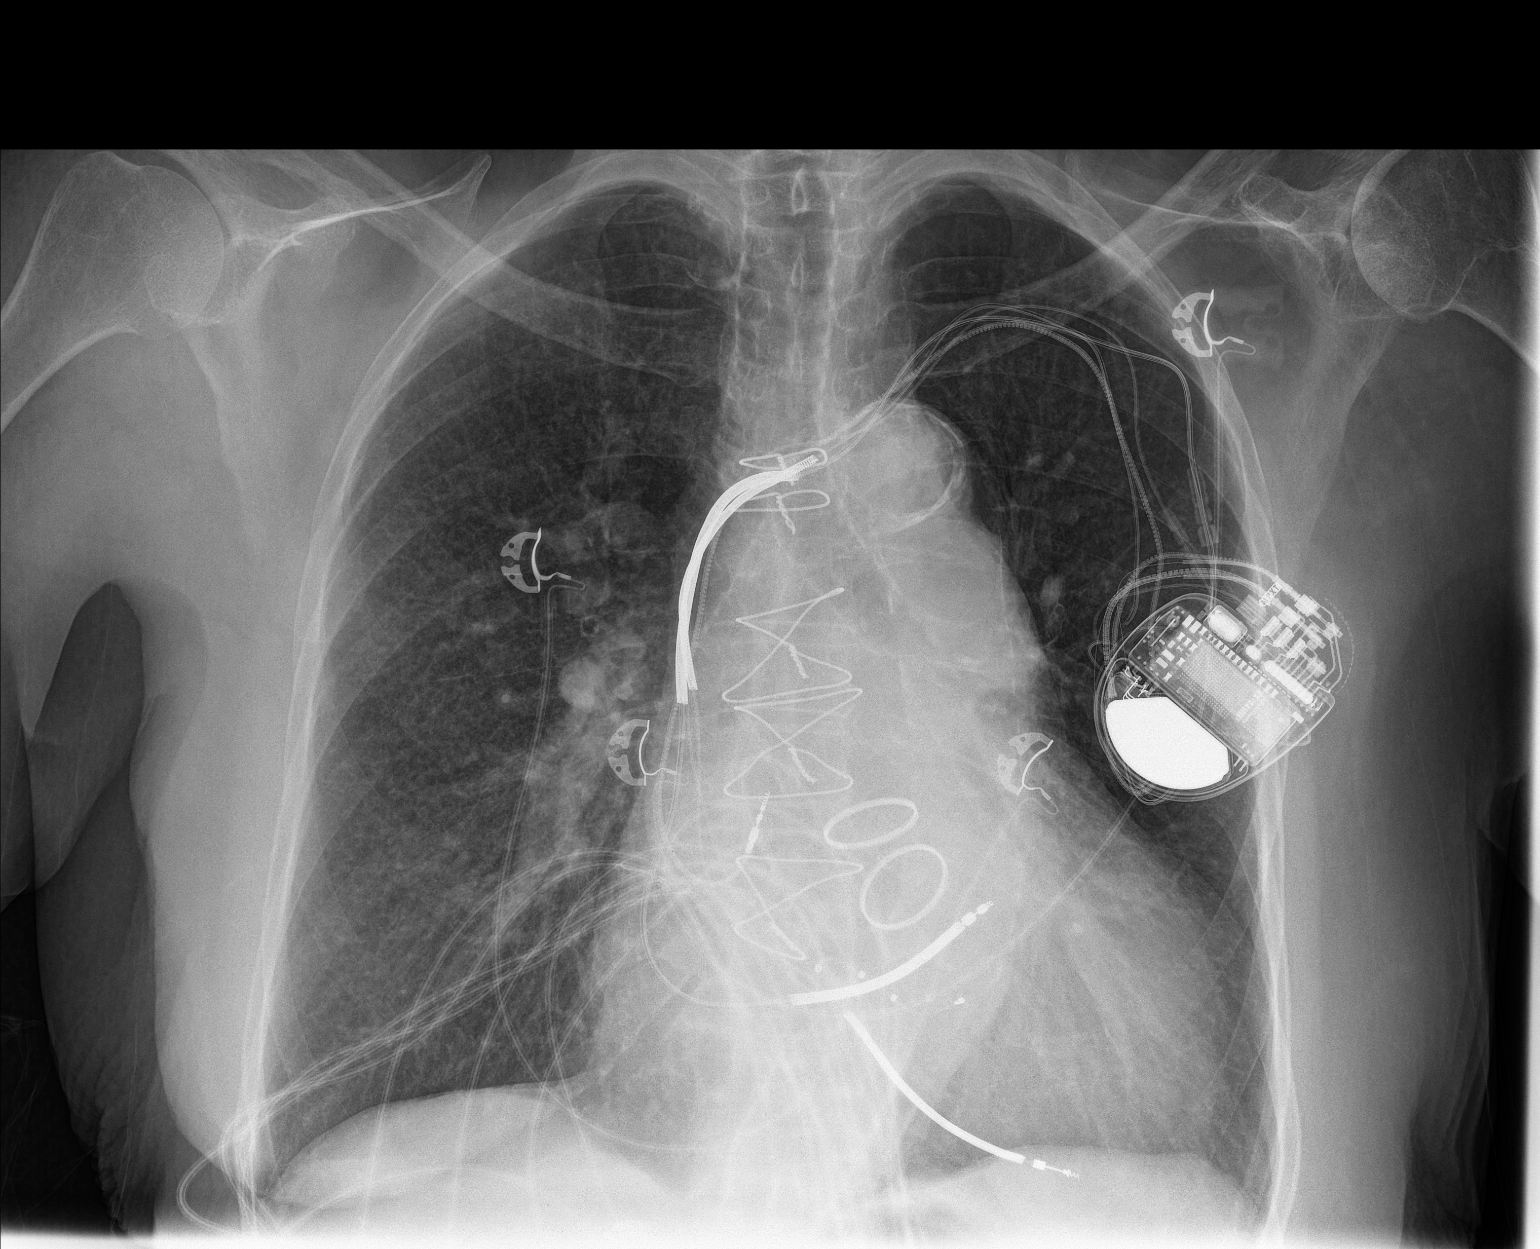

[chest lat]
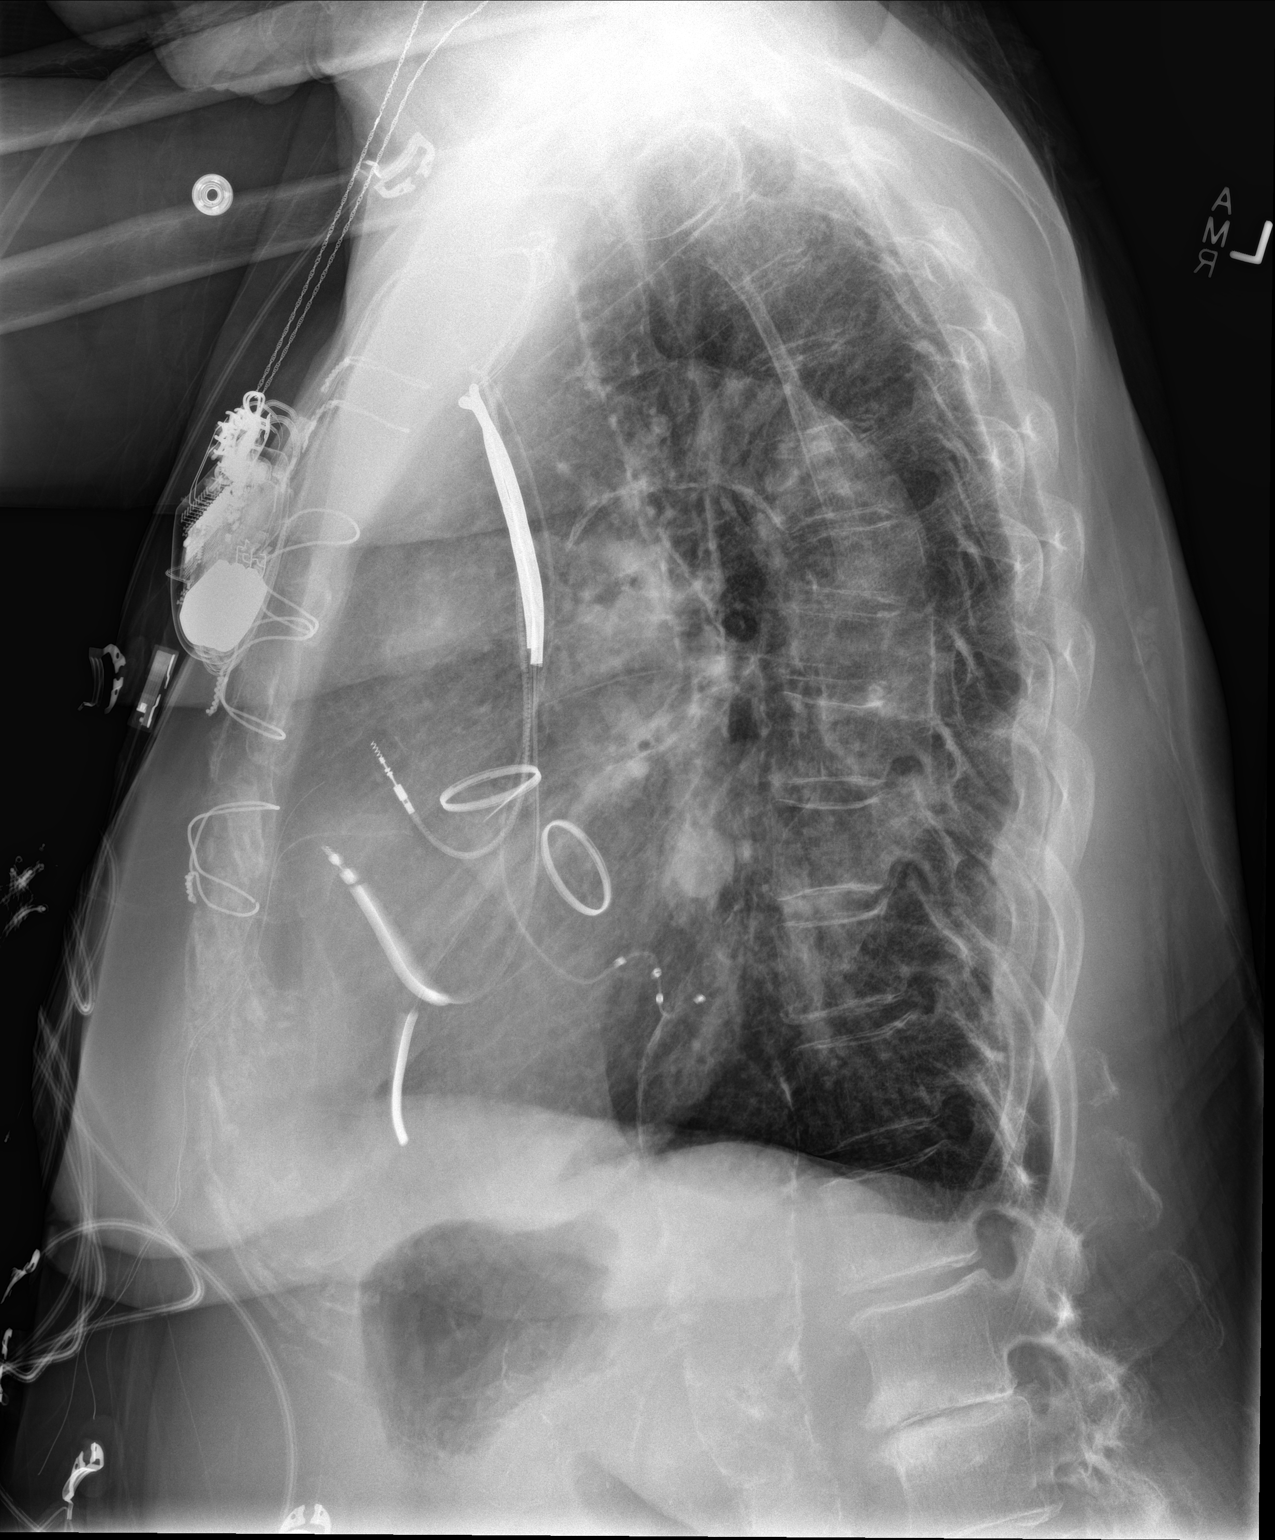

[2 of 2 positions shown; findings below may reference images not displayed]

FINDINGS: There is a fine reticulonodular pattern throughout the lungs of
uncertain significance, possibly chronic. This does appear to have
been present chest x-ray of 12/09/2017 but is not definitely seen on
the chest x-ray of 11/01/2017 or the CT of 11/16/2016. Repeat CT the
chest may be warranted to evaluate the lung parenchyma in more
detail. Mild cardiomegaly is stable and AICD leads remain. Median
sternotomy sutures are unchanged. The bones are osteopenic and a
partially compressed midthoracic vertebral body is unchanged. There
is fusiform aneurysmal dilatation of the distal thoracic aorta at
the level of the GE junction where measures 4.2 cm on the lateral
view. This focal enlargement of the distal thoracic aorta does
appear to have been present on the prior chest x-ray of 11/01/2016
and on the CT of 3808.
IMPRESSION: 1. Fine reticulonodular pattern throughout the lungs of uncertain
significance. Consider CT of the chest without IV contrast to assess
further.
2. No pneumonia or effusion.
3. Stable cardiomegaly with AICD leads.
4. Stable fusiform dilatation of the distal thoracic aorta.

## 2019-06-07 IMAGING — CT CT CHEST W/O CM
2 of 3 series · 15 of 36 positions shown, 18 images · non-contrast
Comparison: Chest radiographs 07/30/2018 and 12/09/2017. Chest CT
11/16/2016.

CLINICAL DATA: Shortness of breath for several days. Productive
cough.

EXAM:
CT CHEST WITHOUT CONTRAST
TECHNIQUE: Multidetector CT imaging of the chest was performed following the
standard protocol without IV contrast.

[Series 2: thorax · axial · 0.64mm/px · z∈[-128,+142]mm · 12 of 159 slices shown, 15 images]
[im 12/159  mediastinal]
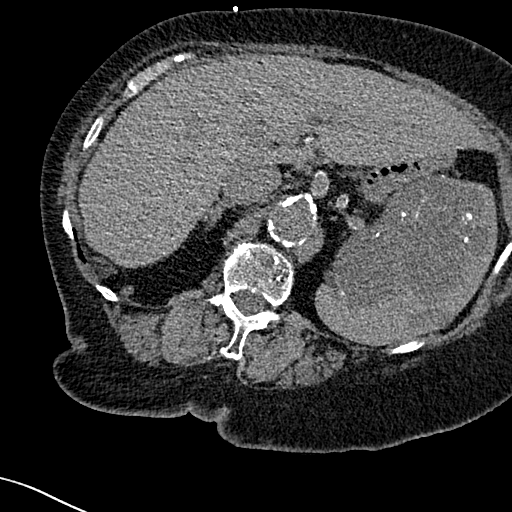
[im 12/159  lung]
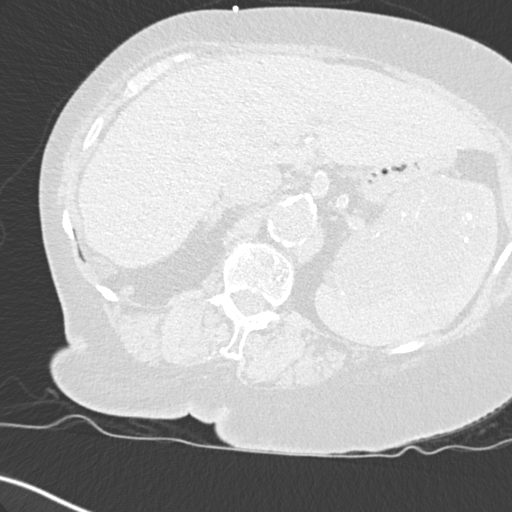
[im 24/159  lung]
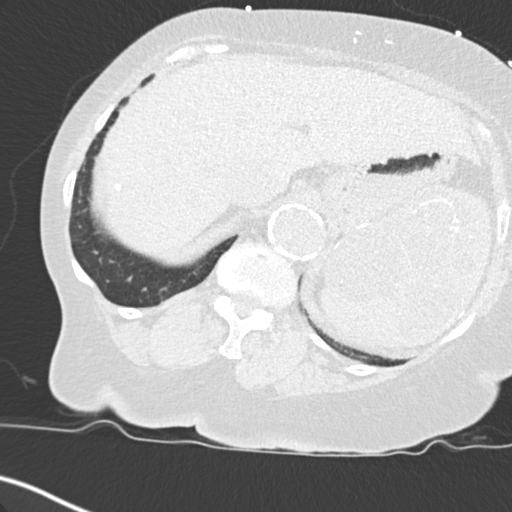
[im 36/159  lung]
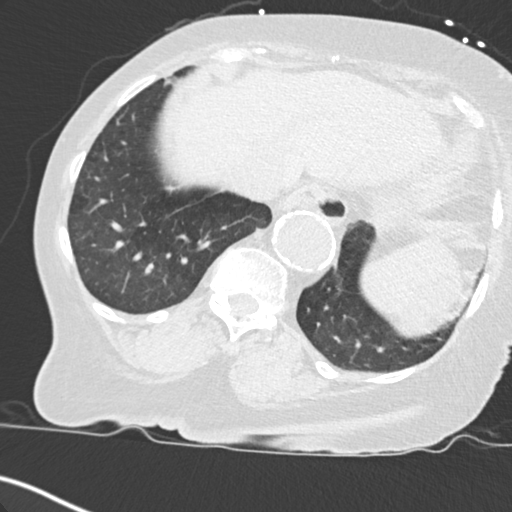
[im 47/159  lung]
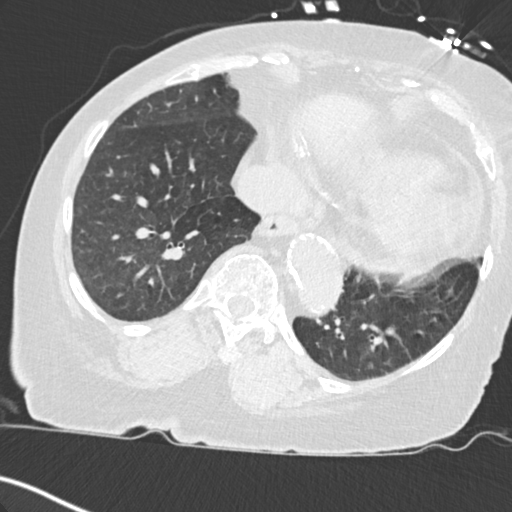
[im 59/159  mediastinal]
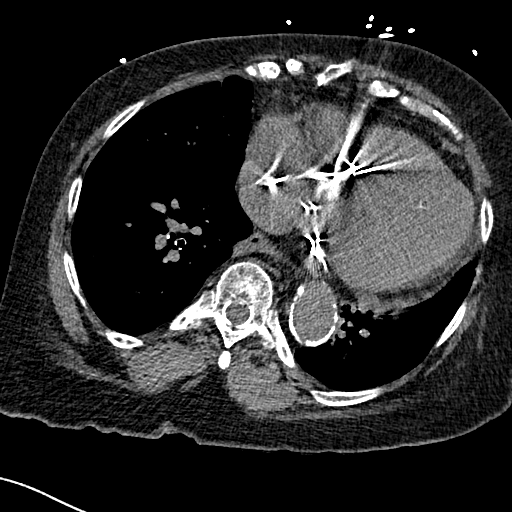
[im 59/159  lung]
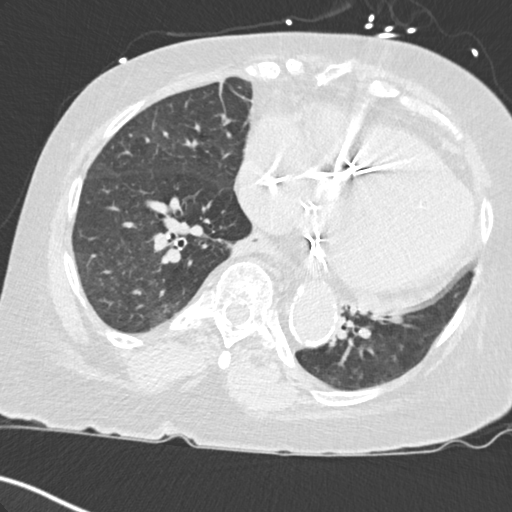
[im 71/159  lung]
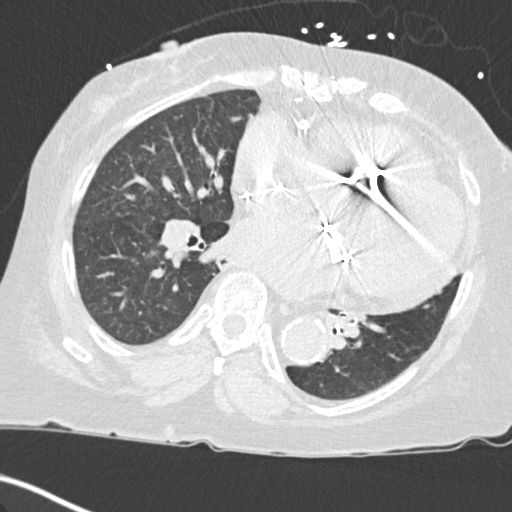
[im 88/159  lung]
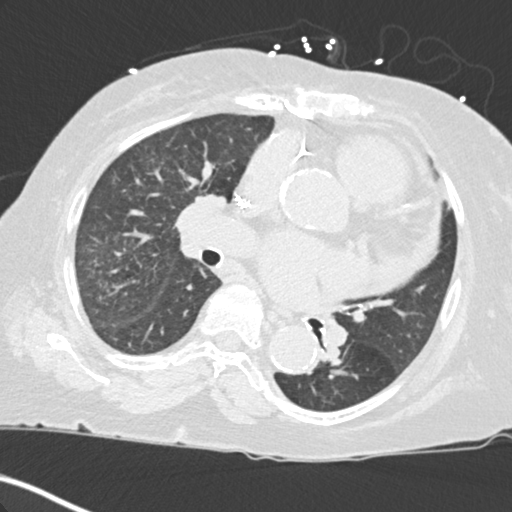
[im 100/159  lung]
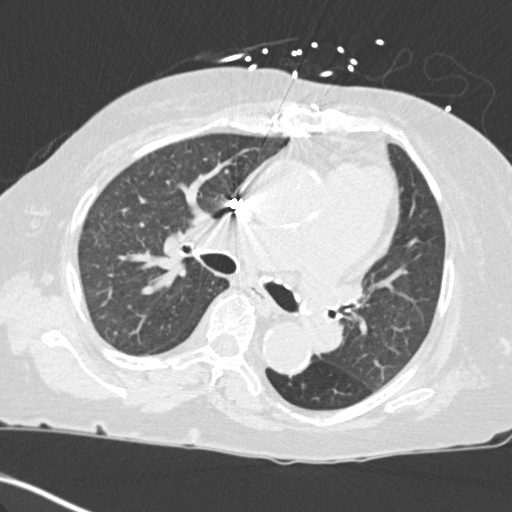
[im 112/159  mediastinal]
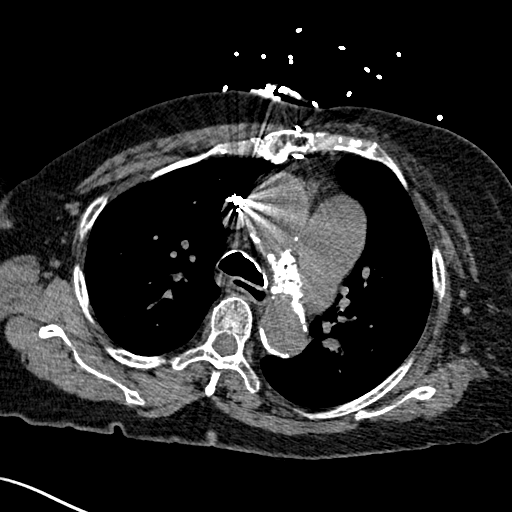
[im 112/159  lung]
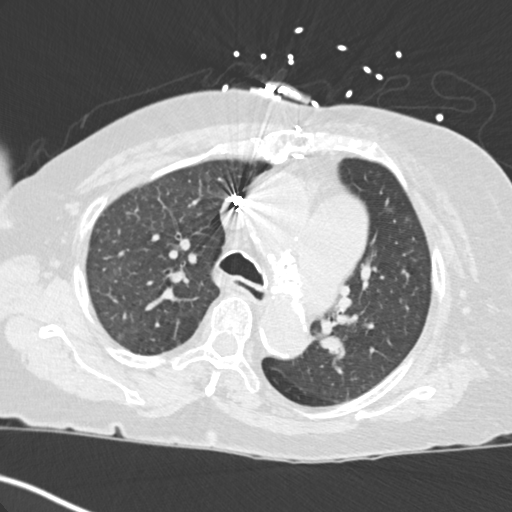
[im 123/159  lung]
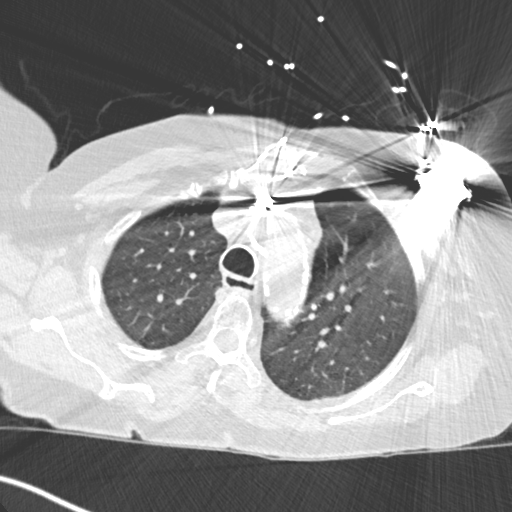
[im 135/159  lung]
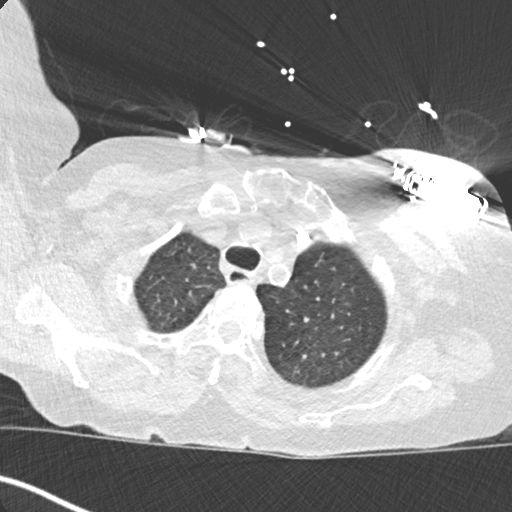
[im 147/159  lung]
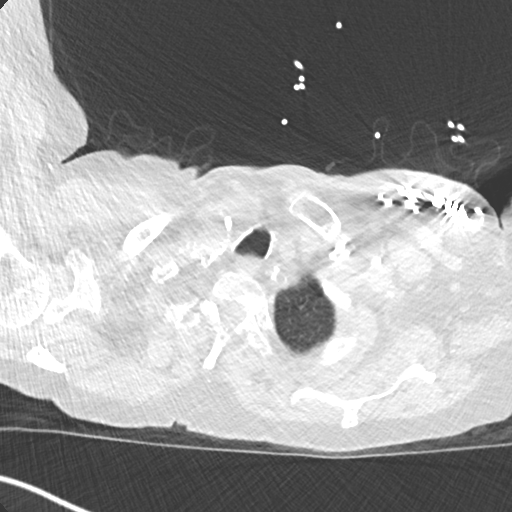

[Series 5: coronal · coronal · 0.62mm/px · 3 of 151 slices shown]
[im 31/151  lung]
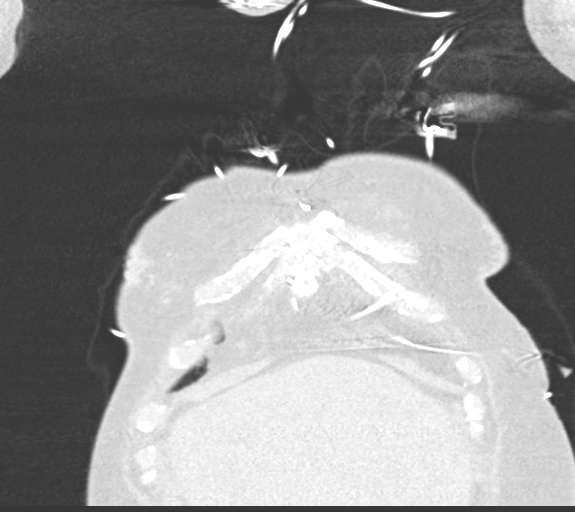
[im 61/151  lung]
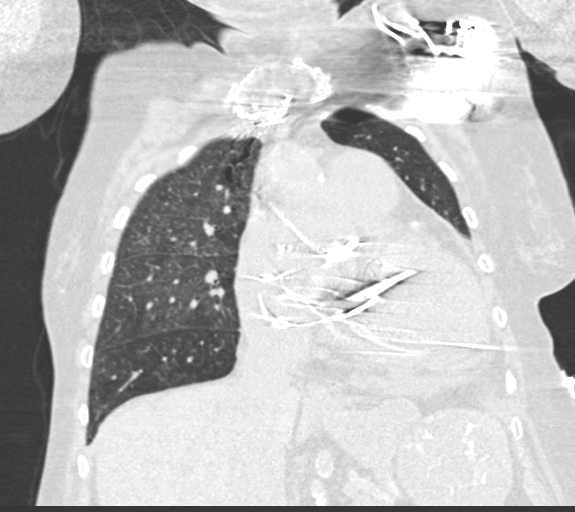
[im 91/151  lung]
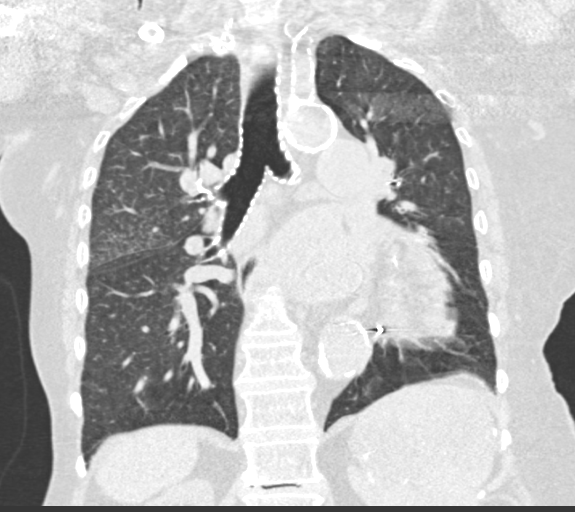

[15 of 36 positions shown; findings below may reference images not displayed]

FINDINGS: Cardiovascular: There is extensive atherosclerosis of the aorta,
great vessels and coronary arteries status post median sternotomy,
aortic and mitral valve replacement. There is central enlargement of
the pulmonary arteries consistent with pulmonary arterial
hypertension. No acute vascular findings are demonstrated. Left
subclavian AICD leads appear unchanged. There is stable moderate
cardiomegaly and trace pericardial fluid.

Mediastinum/Nodes: There are no enlarged mediastinal, hilar or
axillary lymph nodes.Hilar assessment is limited by the lack of
intravenous contrast, although the hilar contours appear unchanged.
The thyroid gland, trachea and esophagus demonstrate no significant
findings.

Lungs/Pleura: There is no pleural effusion or pneumothorax. There is
mild underlying centrilobular emphysema. There is mild diffuse
reticulonodular interstitial prominence, especially within the right
upper lobe. This does not appear acute, although is slightly
progressive from 5535. There is no confluent airspace opacity,
endobronchial lesion or suspicious pulmonary nodule.

Upper abdomen: The spleen is chronically enlarged by multiple
peripherally calcified low-density lesions, grossly stable from
prior studies dating back to at least 7819. There is diffuse aortic
and branch vessel atherosclerosis. No acute findings are seen within
the visualized upper abdomen.

Musculoskeletal/Chest wall: There is no chest wall mass or
suspicious osseous finding. Previous median sternotomy. Probable
developmental variant of the T7 vertebral body, stable.
IMPRESSION: 1. Diffuse reticulonodular interstitial prominence, greatest in the
right upper lobe and mildly progressive from 11/16/2016. This favors
chronic interstitial lung disease superimposed on emphysema.
Consider pneumoconioses and hypersensitivity pneumonitis. No
confluent airspace opacity or definite edema.
2. Stable cardiomegaly post open heart surgery. Central enlargement
of the pulmonary arteries consistent with pulmonary arterial
hypertension.
3.  Aortic Atherosclerosis ([id number].S).
4. Stable enlargement of the spleen by multiple peripherally
calcified low-density masses.

## 2019-06-09 ENCOUNTER — Other Ambulatory Visit: Payer: Self-pay

## 2019-06-09 ENCOUNTER — Inpatient Hospital Stay (HOSPITAL_COMMUNITY): Payer: Medicare Other

## 2019-06-09 ENCOUNTER — Inpatient Hospital Stay (HOSPITAL_COMMUNITY): Payer: Medicare Other | Attending: Hematology

## 2019-06-09 VITALS — BP 136/61 | HR 68 | Temp 96.8°F | Resp 18

## 2019-06-09 DIAGNOSIS — D631 Anemia in chronic kidney disease: Secondary | ICD-10-CM

## 2019-06-09 DIAGNOSIS — N184 Chronic kidney disease, stage 4 (severe): Secondary | ICD-10-CM

## 2019-06-09 DIAGNOSIS — M1711 Unilateral primary osteoarthritis, right knee: Secondary | ICD-10-CM | POA: Diagnosis not present

## 2019-06-09 DIAGNOSIS — R262 Difficulty in walking, not elsewhere classified: Secondary | ICD-10-CM | POA: Diagnosis not present

## 2019-06-09 DIAGNOSIS — D649 Anemia, unspecified: Secondary | ICD-10-CM

## 2019-06-09 DIAGNOSIS — E611 Iron deficiency: Secondary | ICD-10-CM

## 2019-06-09 DIAGNOSIS — J440 Chronic obstructive pulmonary disease with acute lower respiratory infection: Secondary | ICD-10-CM | POA: Diagnosis not present

## 2019-06-09 DIAGNOSIS — I5033 Acute on chronic diastolic (congestive) heart failure: Secondary | ICD-10-CM | POA: Diagnosis not present

## 2019-06-09 DIAGNOSIS — N183 Chronic kidney disease, stage 3 (moderate): Secondary | ICD-10-CM | POA: Diagnosis not present

## 2019-06-09 DIAGNOSIS — I130 Hypertensive heart and chronic kidney disease with heart failure and stage 1 through stage 4 chronic kidney disease, or unspecified chronic kidney disease: Secondary | ICD-10-CM | POA: Diagnosis not present

## 2019-06-09 LAB — CBC
HCT: 31.1 % — ABNORMAL LOW (ref 36.0–46.0)
Hemoglobin: 9.4 g/dL — ABNORMAL LOW (ref 12.0–15.0)
MCH: 29.2 pg (ref 26.0–34.0)
MCHC: 30.2 g/dL (ref 30.0–36.0)
MCV: 96.6 fL (ref 80.0–100.0)
Platelets: 191 10*3/uL (ref 150–400)
RBC: 3.22 MIL/uL — ABNORMAL LOW (ref 3.87–5.11)
RDW: 15.9 % — ABNORMAL HIGH (ref 11.5–15.5)
WBC: 6.3 10*3/uL (ref 4.0–10.5)
nRBC: 0 % (ref 0.0–0.2)

## 2019-06-09 MED ORDER — EPOETIN ALFA-EPBX 40000 UNIT/ML IJ SOLN
40000.0000 [IU] | Freq: Once | INTRAMUSCULAR | Status: AC
  Administered 2019-06-09: 40000 [IU] via SUBCUTANEOUS
  Filled 2019-06-09: qty 1

## 2019-06-09 NOTE — Progress Notes (Signed)
[name] [name] presents today for Retacrit injection. Hemoglobin reviewed prior to administration. VSS. Injection tolerated without incident or complaint. See MAR for details. Patient discharged in satisfactory condition with follow up instructions.

## 2019-06-09 NOTE — Patient Instructions (Signed)
[place] at [facility]  Discharge Instructions:  You received your Retacrit injection today. Your hemoglobin was 9.4. We will see you back in 4 weeks for follow up.  Epoetin Alfa injection What is this medicine? EPOETIN ALFA (e POE e tin AL fa) helps your body make more red blood cells. This medicine is used to treat anemia caused by chronic kidney disease, cancer chemotherapy, or HIV-therapy. It may also be used before surgery if you have anemia. This medicine may be used for other purposes; ask your health care provider or pharmacist if you have questions. COMMON BRAND NAME(S): Epogen, Procrit, Retacrit What should I tell my health care provider before I take this medicine? They need to know if you have any of these conditions:  cancer  heart disease  high blood pressure  history of blood clots  history of stroke  low levels of folate, iron, or vitamin B12 in the blood  seizures  an unusual or allergic reaction to erythropoietin, albumin, benzyl alcohol, hamster proteins, other medicines, foods, dyes, or preservatives  pregnant or trying to get pregnant  breast-feeding How should I use this medicine? This medicine is for injection into a vein or under the skin. It is usually given by a health care professional in a hospital or clinic setting. If you get this medicine at home, you will be taught how to prepare and give this medicine. Use exactly as directed. Take your medicine at regular intervals. Do not take your medicine more often than directed. It is important that you put your used needles and syringes in a special sharps container. Do not put them in a trash can. If you do not have a sharps container, call your pharmacist or healthcare provider to get one. A special MedGuide will be given to you by the pharmacist with each prescription and refill. Be sure to read this information carefully each time. Talk to your pediatrician regarding the use  of this medicine in children. While this drug may be prescribed for selected conditions, precautions do apply. Overdosage: If you think you have taken too much of this medicine contact a poison control center or emergency room at once. NOTE: This medicine is only for you. Do not share this medicine with others. What if I miss a dose? If you miss a dose, take it as soon as you can. If it is almost time for your next dose, take only that dose. Do not take double or extra doses. What may interact with this medicine? Interactions have not been studied. This list may not describe all possible interactions. Give your health care provider a list of all the medicines, herbs, non-prescription drugs, or dietary supplements you use. Also tell them if you smoke, drink alcohol, or use illegal drugs. Some items may interact with your medicine. What should I watch for while using this medicine? Your condition will be monitored carefully while you are receiving this medicine. You may need blood work done while you are taking this medicine. This medicine may cause a decrease in vitamin B6. You should make sure that you get enough vitamin B6 while you are taking this medicine. Discuss the foods you eat and the vitamins you take with your health care professional. What side effects may I notice from receiving this medicine? Side effects that you should report to your doctor or health care professional as soon as possible:  allergic reactions like skin rash, itching or hives, swelling of the face, lips, or  tongue  seizures  signs and symptoms of a blood clot such as breathing problems; changes in vision; chest pain; severe, sudden headache; pain, swelling, warmth in the leg; trouble speaking; sudden numbness or weakness of the face, arm or leg  signs and symptoms of a stroke like changes in vision; confusion; trouble speaking or understanding; severe headaches; sudden numbness or weakness of the face, arm or leg;  trouble walking; dizziness; loss of balance or coordination Side effects that usually do not require medical attention (report to your doctor or health care professional if they continue or are bothersome):  chills  cough  dizziness  fever  headaches  joint pain  muscle cramps  muscle pain  nausea, vomiting  pain, redness, or irritation at site where injected This list may not describe all possible side effects. Call your doctor for medical advice about side effects. You may report side effects to FDA at 1-800-FDA-1088. Where should I keep my medicine? Keep out of the reach of children. Store in a refrigerator between 2 and 8 degrees C (36 and 46 degrees F). Do not freeze or shake. Throw away any unused portion if using a single-dose vial. Multi-dose vials can be kept in the refrigerator for up to 21 days after the initial dose. Throw away unused medicine. NOTE: This sheet is a summary. It may not cover all possible information. If you have questions about this medicine, talk to your doctor, pharmacist, or health care provider.  2020 Elsevier/Gold Standard (2017-04-20 08:35:19)  _______________________________________________________________  Thank you for choosing [place] at [facility] to provide your oncology and hematology care.  To afford each patient quality time with our providers, please arrive at least 15 minutes before your scheduled appointment.  You need to re-schedule your appointment if you arrive 10 or more minutes late.  We strive to give you quality time with our providers, and arriving late affects you and other patients whose appointments are after yours.  Also, if you no show three or more times for appointments you may be dismissed from the clinic.  Again, thank you for choosing [place] at [place] hope is that these requests will allow you access to exceptional care and in a timely  manner. _______________________________________________________________  If you have questions after your visit, please contact our office at [phone] between the hours of 8:30 a.m. and 5:00 p.m. Voicemails left after 4:30 p.m. will not be returned until the following business day. _______________________________________________________________  For prescription refill requests, have your pharmacy contact our office. _______________________________________________________________  Recommendations made by the consultant and any test results will be sent to your referring physician. _______________________________________________________________

## 2019-06-10 DIAGNOSIS — N183 Chronic kidney disease, stage 3 (moderate): Secondary | ICD-10-CM | POA: Diagnosis not present

## 2019-06-10 DIAGNOSIS — R262 Difficulty in walking, not elsewhere classified: Secondary | ICD-10-CM | POA: Diagnosis not present

## 2019-06-10 DIAGNOSIS — M1711 Unilateral primary osteoarthritis, right knee: Secondary | ICD-10-CM | POA: Diagnosis not present

## 2019-06-10 DIAGNOSIS — J440 Chronic obstructive pulmonary disease with acute lower respiratory infection: Secondary | ICD-10-CM | POA: Diagnosis not present

## 2019-06-10 DIAGNOSIS — I130 Hypertensive heart and chronic kidney disease with heart failure and stage 1 through stage 4 chronic kidney disease, or unspecified chronic kidney disease: Secondary | ICD-10-CM | POA: Diagnosis not present

## 2019-06-10 DIAGNOSIS — I5033 Acute on chronic diastolic (congestive) heart failure: Secondary | ICD-10-CM | POA: Diagnosis not present

## 2019-06-11 ENCOUNTER — Telehealth: Payer: Self-pay | Admitting: *Deleted

## 2019-06-11 ENCOUNTER — Other Ambulatory Visit: Payer: Self-pay

## 2019-06-11 DIAGNOSIS — N184 Chronic kidney disease, stage 4 (severe): Secondary | ICD-10-CM

## 2019-06-11 MED ORDER — ALLOPURINOL 100 MG PO TABS: ORAL_TABLET | ORAL | 1 refills | Status: DC

## 2019-06-11 MED ORDER — FOLIC ACID 1 MG PO TABS: 1.0000 mg | ORAL_TABLET | Freq: Every day | ORAL | 1 refills | Status: DC

## 2019-06-11 MED ORDER — POTASSIUM CHLORIDE CRYS ER 20 MEQ PO TBCR: 20.0000 meq | EXTENDED_RELEASE_TABLET | Freq: Every day | ORAL | 1 refills | Status: DC

## 2019-06-11 MED ORDER — METOPROLOL SUCCINATE ER 100 MG PO TB24: ORAL_TABLET | ORAL | 1 refills | Status: DC

## 2019-06-11 MED ORDER — PRAVASTATIN SODIUM 40 MG PO TABS: 40.0000 mg | ORAL_TABLET | Freq: Every evening | ORAL | 3 refills | Status: DC

## 2019-06-11 MED ORDER — ALBUTEROL SULFATE HFA 108 (90 BASE) MCG/ACT IN AERS: INHALATION_SPRAY | RESPIRATORY_TRACT | 2 refills | Status: DC

## 2019-06-11 MED ORDER — ONDANSETRON HCL 4 MG PO TABS: 4.0000 mg | ORAL_TABLET | Freq: Every day | ORAL | 1 refills | Status: DC | PRN

## 2019-06-11 MED ORDER — FLUTICASONE PROPIONATE 50 MCG/ACT NA SUSP: NASAL | 1 refills | Status: DC

## 2019-06-11 MED ORDER — SYNTHROID 75 MCG PO TABS: ORAL_TABLET | ORAL | 1 refills | Status: DC

## 2019-06-11 NOTE — Telephone Encounter (Signed)
Pt called to let the nurse know that she needs all of her medications switched to [facility]. This should be the same she gets her PT from is what she said. She also needs her eye medication (could not remember name) as she was out of the medication. Asked her if she wanted a temp supply sent somewhere local and she said she would just have that one sent to [facility] also.

## 2019-06-11 NOTE — Telephone Encounter (Signed)
All meds prescribed by Dr [name] [facility] (no eye med seen on her medlist- this needs to come from eye dr)

## 2019-06-12 ENCOUNTER — Ambulatory Visit (INDEPENDENT_AMBULATORY_CARE_PROVIDER_SITE_OTHER): Payer: Medicare Other | Admitting: *Deleted

## 2019-06-12 DIAGNOSIS — Z952 Presence of prosthetic heart valve: Secondary | ICD-10-CM | POA: Diagnosis not present

## 2019-06-12 DIAGNOSIS — N183 Chronic kidney disease, stage 3 (moderate): Secondary | ICD-10-CM

## 2019-06-12 DIAGNOSIS — Z95810 Presence of automatic (implantable) cardiac defibrillator: Secondary | ICD-10-CM

## 2019-06-12 DIAGNOSIS — M1711 Unilateral primary osteoarthritis, right knee: Secondary | ICD-10-CM

## 2019-06-12 DIAGNOSIS — I8393 Asymptomatic varicose veins of bilateral lower extremities: Secondary | ICD-10-CM

## 2019-06-12 DIAGNOSIS — Z7901 Long term (current) use of anticoagulants: Secondary | ICD-10-CM

## 2019-06-12 DIAGNOSIS — J449 Chronic obstructive pulmonary disease, unspecified: Secondary | ICD-10-CM

## 2019-06-12 DIAGNOSIS — R262 Difficulty in walking, not elsewhere classified: Secondary | ICD-10-CM

## 2019-06-12 DIAGNOSIS — Z950 Presence of cardiac pacemaker: Secondary | ICD-10-CM

## 2019-06-12 DIAGNOSIS — M6281 Muscle weakness (generalized): Secondary | ICD-10-CM

## 2019-06-12 DIAGNOSIS — I442 Atrioventricular block, complete: Secondary | ICD-10-CM

## 2019-06-12 DIAGNOSIS — Z5181 Encounter for therapeutic drug level monitoring: Secondary | ICD-10-CM

## 2019-06-12 DIAGNOSIS — J440 Chronic obstructive pulmonary disease with acute lower respiratory infection: Secondary | ICD-10-CM | POA: Diagnosis not present

## 2019-06-12 DIAGNOSIS — F329 Major depressive disorder, single episode, unspecified: Secondary | ICD-10-CM

## 2019-06-12 DIAGNOSIS — Z9981 Dependence on supplemental oxygen: Secondary | ICD-10-CM

## 2019-06-12 DIAGNOSIS — I130 Hypertensive heart and chronic kidney disease with heart failure and stage 1 through stage 4 chronic kidney disease, or unspecified chronic kidney disease: Secondary | ICD-10-CM

## 2019-06-12 DIAGNOSIS — I5033 Acute on chronic diastolic (congestive) heart failure: Secondary | ICD-10-CM

## 2019-06-12 LAB — POCT INR: INR: 2.8 (ref 2.0–3.0)

## 2019-06-12 NOTE — Patient Instructions (Signed)
Take coumadin 1 tablet tonight then resume 2 tablets daily except 1 tablet on Tuesdays and Saturdays.  Started on Protonix Continue greens  Recheck in 1 week Order given to [employer] while in pt's

## 2019-06-13 ENCOUNTER — Telehealth: Payer: Self-pay

## 2019-06-13 DIAGNOSIS — J440 Chronic obstructive pulmonary disease with acute lower respiratory infection: Secondary | ICD-10-CM | POA: Diagnosis not present

## 2019-06-13 DIAGNOSIS — R262 Difficulty in walking, not elsewhere classified: Secondary | ICD-10-CM | POA: Diagnosis not present

## 2019-06-13 DIAGNOSIS — N183 Chronic kidney disease, stage 3 (moderate): Secondary | ICD-10-CM | POA: Diagnosis not present

## 2019-06-13 DIAGNOSIS — M1711 Unilateral primary osteoarthritis, right knee: Secondary | ICD-10-CM | POA: Diagnosis not present

## 2019-06-13 DIAGNOSIS — I130 Hypertensive heart and chronic kidney disease with heart failure and stage 1 through stage 4 chronic kidney disease, or unspecified chronic kidney disease: Secondary | ICD-10-CM | POA: Diagnosis not present

## 2019-06-13 DIAGNOSIS — I5033 Acute on chronic diastolic (congestive) heart failure: Secondary | ICD-10-CM | POA: Diagnosis not present

## 2019-06-13 NOTE — Telephone Encounter (Signed)
Pt states her monitor light stays on for a long period of time. I told the pt to unplug her monitor for 20 seconds then plug it back up. If that do not help to call [facility] tech support and they will be able to help her with her monitor. The pt verbalized understanding and thanked me for the help.

## 2019-06-16 ENCOUNTER — Other Ambulatory Visit (HOSPITAL_COMMUNITY): Payer: Self-pay | Admitting: Family Medicine

## 2019-06-16 DIAGNOSIS — Z1231 Encounter for screening mammogram for malignant neoplasm of breast: Secondary | ICD-10-CM

## 2019-06-17 DIAGNOSIS — I5033 Acute on chronic diastolic (congestive) heart failure: Secondary | ICD-10-CM | POA: Diagnosis not present

## 2019-06-17 DIAGNOSIS — N183 Chronic kidney disease, stage 3 (moderate): Secondary | ICD-10-CM | POA: Diagnosis not present

## 2019-06-17 DIAGNOSIS — M1711 Unilateral primary osteoarthritis, right knee: Secondary | ICD-10-CM | POA: Diagnosis not present

## 2019-06-17 DIAGNOSIS — R262 Difficulty in walking, not elsewhere classified: Secondary | ICD-10-CM | POA: Diagnosis not present

## 2019-06-17 DIAGNOSIS — J440 Chronic obstructive pulmonary disease with acute lower respiratory infection: Secondary | ICD-10-CM | POA: Diagnosis not present

## 2019-06-17 DIAGNOSIS — I130 Hypertensive heart and chronic kidney disease with heart failure and stage 1 through stage 4 chronic kidney disease, or unspecified chronic kidney disease: Secondary | ICD-10-CM | POA: Diagnosis not present

## 2019-06-18 DIAGNOSIS — I130 Hypertensive heart and chronic kidney disease with heart failure and stage 1 through stage 4 chronic kidney disease, or unspecified chronic kidney disease: Secondary | ICD-10-CM | POA: Diagnosis not present

## 2019-06-18 DIAGNOSIS — N183 Chronic kidney disease, stage 3 (moderate): Secondary | ICD-10-CM | POA: Diagnosis not present

## 2019-06-18 DIAGNOSIS — R262 Difficulty in walking, not elsewhere classified: Secondary | ICD-10-CM | POA: Diagnosis not present

## 2019-06-18 DIAGNOSIS — M1711 Unilateral primary osteoarthritis, right knee: Secondary | ICD-10-CM | POA: Diagnosis not present

## 2019-06-18 DIAGNOSIS — J440 Chronic obstructive pulmonary disease with acute lower respiratory infection: Secondary | ICD-10-CM | POA: Diagnosis not present

## 2019-06-18 DIAGNOSIS — I5033 Acute on chronic diastolic (congestive) heart failure: Secondary | ICD-10-CM | POA: Diagnosis not present

## 2019-06-19 ENCOUNTER — Other Ambulatory Visit: Payer: Self-pay

## 2019-06-19 ENCOUNTER — Ambulatory Visit (INDEPENDENT_AMBULATORY_CARE_PROVIDER_SITE_OTHER): Payer: Medicare Other | Admitting: Pharmacist

## 2019-06-19 ENCOUNTER — Telehealth: Payer: Self-pay | Admitting: *Deleted

## 2019-06-19 ENCOUNTER — Telehealth: Payer: Self-pay | Admitting: Family Medicine

## 2019-06-19 DIAGNOSIS — I5033 Acute on chronic diastolic (congestive) heart failure: Secondary | ICD-10-CM | POA: Diagnosis not present

## 2019-06-19 DIAGNOSIS — Z952 Presence of prosthetic heart valve: Secondary | ICD-10-CM | POA: Diagnosis not present

## 2019-06-19 DIAGNOSIS — Z5181 Encounter for therapeutic drug level monitoring: Secondary | ICD-10-CM | POA: Diagnosis not present

## 2019-06-19 DIAGNOSIS — N183 Chronic kidney disease, stage 3 (moderate): Secondary | ICD-10-CM | POA: Diagnosis not present

## 2019-06-19 DIAGNOSIS — R262 Difficulty in walking, not elsewhere classified: Secondary | ICD-10-CM | POA: Diagnosis not present

## 2019-06-19 DIAGNOSIS — J440 Chronic obstructive pulmonary disease with acute lower respiratory infection: Secondary | ICD-10-CM | POA: Diagnosis not present

## 2019-06-19 DIAGNOSIS — M1711 Unilateral primary osteoarthritis, right knee: Secondary | ICD-10-CM | POA: Diagnosis not present

## 2019-06-19 DIAGNOSIS — I130 Hypertensive heart and chronic kidney disease with heart failure and stage 1 through stage 4 chronic kidney disease, or unspecified chronic kidney disease: Secondary | ICD-10-CM | POA: Diagnosis not present

## 2019-06-19 LAB — POCT INR: INR: 2.6 (ref 2.0–3.0)

## 2019-06-19 NOTE — Telephone Encounter (Signed)
Traci with [facility] called INR 2.6   Please call [phone]

## 2019-06-19 NOTE — Telephone Encounter (Signed)
Refills are being denied --please advise why and call back Olivia Mackie

## 2019-06-19 NOTE — Telephone Encounter (Signed)
Spoke with Olivia Mackie. She was no longer with the patient and asked me to call the patient. Spoke with the patient. Every prescription she named has been filled in September and it isn't time for it to be filled. I advised her of this with verbal understanding. I also advised her the other medications coming from other doctors have to be filled by those doctors. She verbalized understanding and thanked me for my help.

## 2019-06-19 NOTE — Telephone Encounter (Signed)
See anticoag encounter from 06/19/2019

## 2019-06-20 DIAGNOSIS — E1142 Type 2 diabetes mellitus with diabetic polyneuropathy: Secondary | ICD-10-CM | POA: Diagnosis not present

## 2019-06-20 DIAGNOSIS — L603 Nail dystrophy: Secondary | ICD-10-CM | POA: Diagnosis not present

## 2019-06-20 DIAGNOSIS — L851 Acquired keratosis [keratoderma] palmaris et plantaris: Secondary | ICD-10-CM | POA: Diagnosis not present

## 2019-06-23 ENCOUNTER — Other Ambulatory Visit: Payer: Self-pay | Admitting: Radiology

## 2019-06-23 ENCOUNTER — Other Ambulatory Visit: Payer: Self-pay

## 2019-06-23 ENCOUNTER — Telehealth: Payer: Self-pay | Admitting: *Deleted

## 2019-06-23 MED ORDER — MECLIZINE HCL 12.5 MG PO TABS: 12.5000 mg | ORAL_TABLET | Freq: Three times a day (TID) | ORAL | 2 refills | Status: DC | PRN

## 2019-06-23 NOTE — Telephone Encounter (Signed)
RN called, LM stating that patient showed her a list of medications that were denied by her pharmacy.  One was her hydrocodone Rx.  So she called to make sure we get that refill sent in and/or approved for patient.

## 2019-06-23 NOTE — Telephone Encounter (Signed)
Olivia Mackie with [facility] called about pt. Her medications had been denied. The gabapentin, hydrocodone, and meclizine. [facility] and the reason for not filling them was the physician denied meds. She would like a call back at [phone]

## 2019-06-23 NOTE — Telephone Encounter (Signed)
Spoke with Olivia Mackie and let her know that Gabapentin was just refilled on 05/31/2019 for 60 capsules and had 2 refills left and was from [facility]. The hydrocodone was from [facility] and we don't refill prescriptions coming from a different provider. I advised her I spoke with the patient and the patient stated she didn't need the Meclizine at that time, but I could refill it. I had gone over all of this with the patient in detail letting her know that all of her prescriptions from [facility] had been refilled earlier this month and aren't due right now.

## 2019-06-23 NOTE — Telephone Encounter (Signed)
Meclizine refilled.

## 2019-06-24 ENCOUNTER — Other Ambulatory Visit: Payer: Self-pay | Admitting: Orthopedic Surgery

## 2019-06-24 DIAGNOSIS — R262 Difficulty in walking, not elsewhere classified: Secondary | ICD-10-CM | POA: Diagnosis not present

## 2019-06-24 DIAGNOSIS — N183 Chronic kidney disease, stage 3 (moderate): Secondary | ICD-10-CM | POA: Diagnosis not present

## 2019-06-24 DIAGNOSIS — J440 Chronic obstructive pulmonary disease with acute lower respiratory infection: Secondary | ICD-10-CM | POA: Diagnosis not present

## 2019-06-24 DIAGNOSIS — I5033 Acute on chronic diastolic (congestive) heart failure: Secondary | ICD-10-CM | POA: Diagnosis not present

## 2019-06-24 DIAGNOSIS — I130 Hypertensive heart and chronic kidney disease with heart failure and stage 1 through stage 4 chronic kidney disease, or unspecified chronic kidney disease: Secondary | ICD-10-CM | POA: Diagnosis not present

## 2019-06-24 DIAGNOSIS — M1711 Unilateral primary osteoarthritis, right knee: Secondary | ICD-10-CM | POA: Diagnosis not present

## 2019-06-24 MED ORDER — HYDROCODONE-ACETAMINOPHEN 7.5-325 MG PO TABS: 1.0000 | ORAL_TABLET | Freq: Four times a day (QID) | ORAL | 0 refills | Status: DC | PRN

## 2019-06-24 NOTE — Telephone Encounter (Signed)
Dr Luna Glasgow must have denied the prescription, per nurse. I can not find out why, can you refill? Or since he denied, do you want to send to him to advise? He will return on Oct 6th.

## 2019-06-24 NOTE — Telephone Encounter (Signed)
Last script was July   Declined

## 2019-06-25 ENCOUNTER — Telehealth: Payer: Self-pay | Admitting: Family Medicine

## 2019-06-25 NOTE — Telephone Encounter (Signed)
Pt is requesting for some help regarding a [place] and [place].

## 2019-06-26 ENCOUNTER — Ambulatory Visit (INDEPENDENT_AMBULATORY_CARE_PROVIDER_SITE_OTHER): Payer: Medicare Other | Admitting: *Deleted

## 2019-06-26 ENCOUNTER — Encounter: Payer: Self-pay | Admitting: *Deleted

## 2019-06-26 ENCOUNTER — Telehealth: Payer: Self-pay | Admitting: *Deleted

## 2019-06-26 DIAGNOSIS — M1711 Unilateral primary osteoarthritis, right knee: Secondary | ICD-10-CM | POA: Diagnosis not present

## 2019-06-26 DIAGNOSIS — R262 Difficulty in walking, not elsewhere classified: Secondary | ICD-10-CM | POA: Diagnosis not present

## 2019-06-26 DIAGNOSIS — Z952 Presence of prosthetic heart valve: Secondary | ICD-10-CM | POA: Diagnosis not present

## 2019-06-26 DIAGNOSIS — I130 Hypertensive heart and chronic kidney disease with heart failure and stage 1 through stage 4 chronic kidney disease, or unspecified chronic kidney disease: Secondary | ICD-10-CM | POA: Diagnosis not present

## 2019-06-26 DIAGNOSIS — N183 Chronic kidney disease, stage 3 (moderate): Secondary | ICD-10-CM | POA: Diagnosis not present

## 2019-06-26 DIAGNOSIS — Z5181 Encounter for therapeutic drug level monitoring: Secondary | ICD-10-CM | POA: Diagnosis not present

## 2019-06-26 DIAGNOSIS — I5033 Acute on chronic diastolic (congestive) heart failure: Secondary | ICD-10-CM | POA: Diagnosis not present

## 2019-06-26 DIAGNOSIS — J440 Chronic obstructive pulmonary disease with acute lower respiratory infection: Secondary | ICD-10-CM | POA: Diagnosis not present

## 2019-06-26 LAB — POCT INR: INR: 2 (ref 2.0–3.0)

## 2019-06-26 NOTE — Telephone Encounter (Signed)
Order sent to [facility]

## 2019-06-26 NOTE — Telephone Encounter (Signed)
INR 2.0  Please call Kathlee Nations at [facility] [phone]

## 2019-06-26 NOTE — Telephone Encounter (Signed)
Done.  See coumadin note. 

## 2019-06-26 NOTE — Patient Instructions (Signed)
Continue coumadin 2 tablets daily except 1 tablet on Tuesdays, Thursday and Saturdays.  Started on Protonix Continue greens Recheck in 1 week Order given to [employer] while in pt's home

## 2019-06-26 NOTE — Progress Notes (Signed)
This encounter was created in error - please disregard.

## 2019-06-27 ENCOUNTER — Other Ambulatory Visit: Payer: Self-pay

## 2019-06-27 ENCOUNTER — Ambulatory Visit (HOSPITAL_COMMUNITY)
Admission: RE | Admit: 2019-06-27 | Discharge: 2019-06-27 | Disposition: A | Discharge status: Home / Self Care | Payer: Medicare Other | Source: Ambulatory Visit | Attending: Family Medicine | Admitting: Family Medicine

## 2019-06-27 DIAGNOSIS — R262 Difficulty in walking, not elsewhere classified: Secondary | ICD-10-CM | POA: Diagnosis not present

## 2019-06-27 DIAGNOSIS — N183 Chronic kidney disease, stage 3 (moderate): Secondary | ICD-10-CM | POA: Diagnosis not present

## 2019-06-27 DIAGNOSIS — I130 Hypertensive heart and chronic kidney disease with heart failure and stage 1 through stage 4 chronic kidney disease, or unspecified chronic kidney disease: Secondary | ICD-10-CM | POA: Diagnosis not present

## 2019-06-27 DIAGNOSIS — I5033 Acute on chronic diastolic (congestive) heart failure: Secondary | ICD-10-CM | POA: Diagnosis not present

## 2019-06-27 DIAGNOSIS — Z1231 Encounter for screening mammogram for malignant neoplasm of breast: Secondary | ICD-10-CM | POA: Insufficient documentation

## 2019-06-27 DIAGNOSIS — M1711 Unilateral primary osteoarthritis, right knee: Secondary | ICD-10-CM | POA: Diagnosis not present

## 2019-06-27 DIAGNOSIS — J440 Chronic obstructive pulmonary disease with acute lower respiratory infection: Secondary | ICD-10-CM | POA: Diagnosis not present

## 2019-06-30 ENCOUNTER — Telehealth: Payer: Self-pay | Admitting: *Deleted

## 2019-06-30 ENCOUNTER — Ambulatory Visit (INDEPENDENT_AMBULATORY_CARE_PROVIDER_SITE_OTHER): Payer: Medicare Other

## 2019-06-30 ENCOUNTER — Other Ambulatory Visit (HOSPITAL_COMMUNITY): Payer: Self-pay | Admitting: Family Medicine

## 2019-06-30 DIAGNOSIS — Z95810 Presence of automatic (implantable) cardiac defibrillator: Secondary | ICD-10-CM | POA: Diagnosis not present

## 2019-06-30 DIAGNOSIS — R928 Other abnormal and inconclusive findings on diagnostic imaging of breast: Secondary | ICD-10-CM

## 2019-06-30 DIAGNOSIS — I5032 Chronic diastolic (congestive) heart failure: Secondary | ICD-10-CM

## 2019-06-30 IMAGING — US US EXTREM LOW VENOUS BILAT
1 series · 13 of 24 positions shown · non-contrast
Comparison: None.

CLINICAL DATA: Bilateral lower extremity leg pain and edema



[Series 1: us extrem low venous bilat · 0.08mm/px · 13 of 86 slices shown]
[im 1/86]
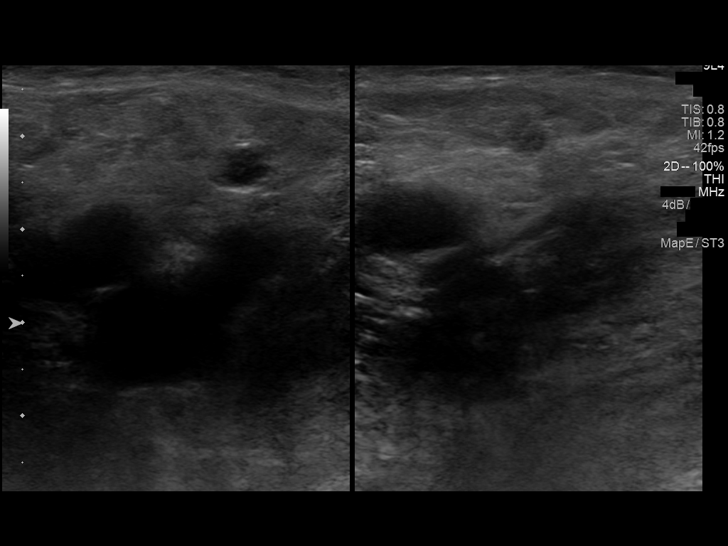
[im 8/86]
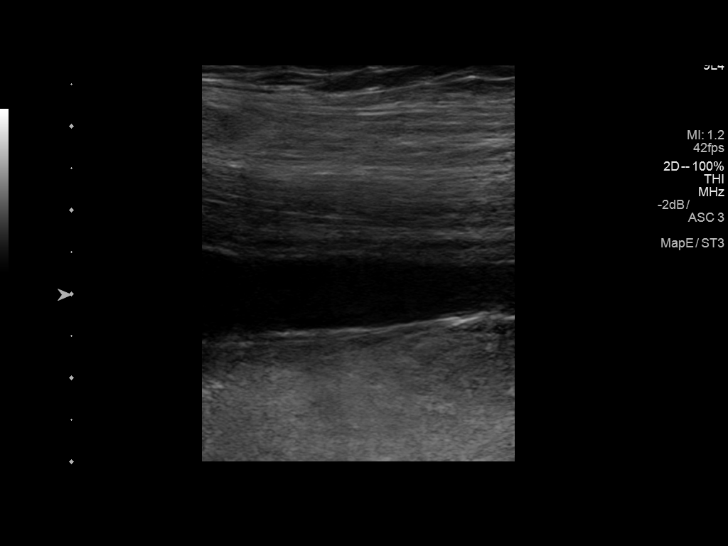
[im 15/86]
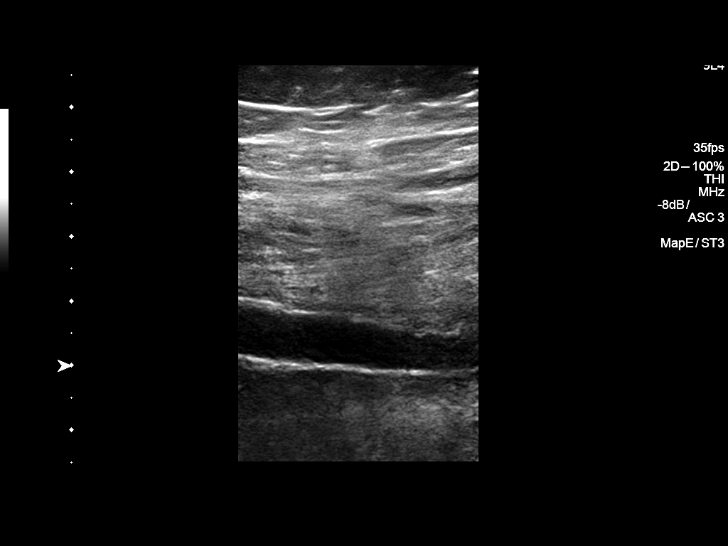
[im 23/86]
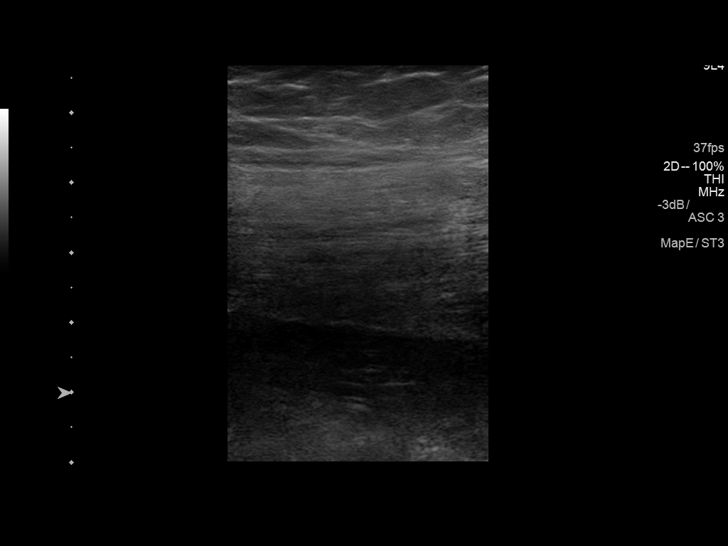
[im 30/86]
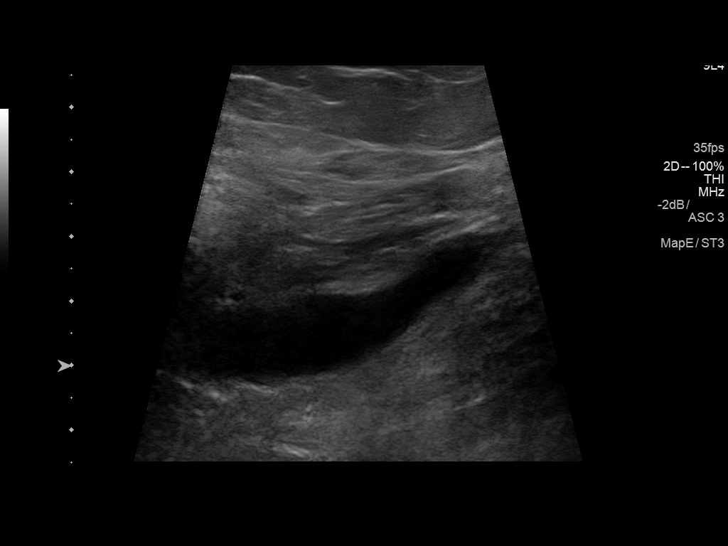
[im 37/86]
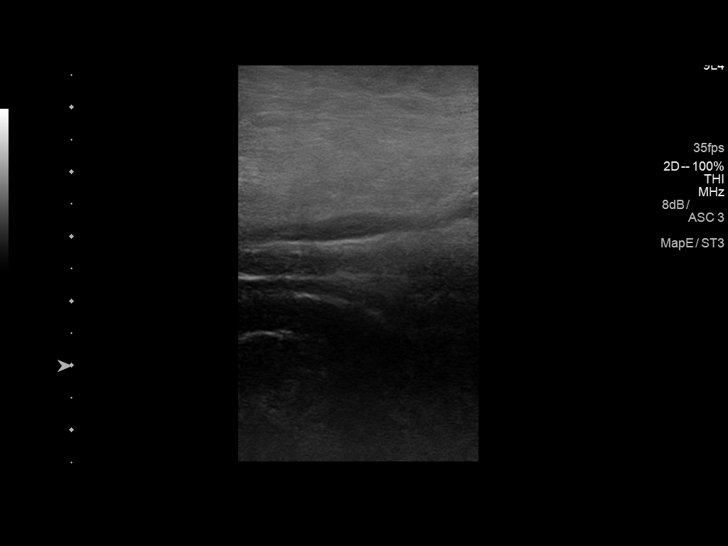
[im 45/86]
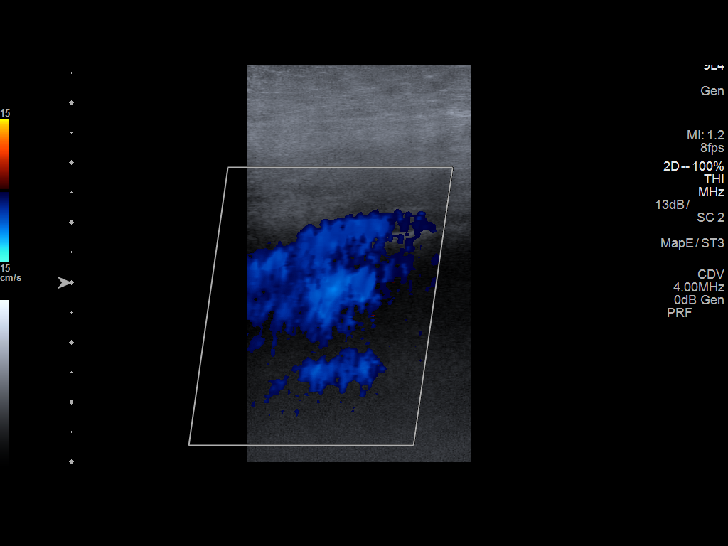
[im 49/86]
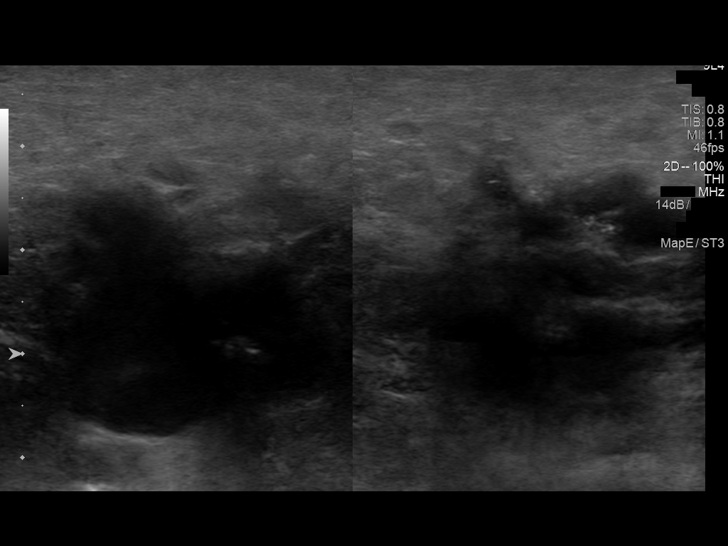
[im 56/86]
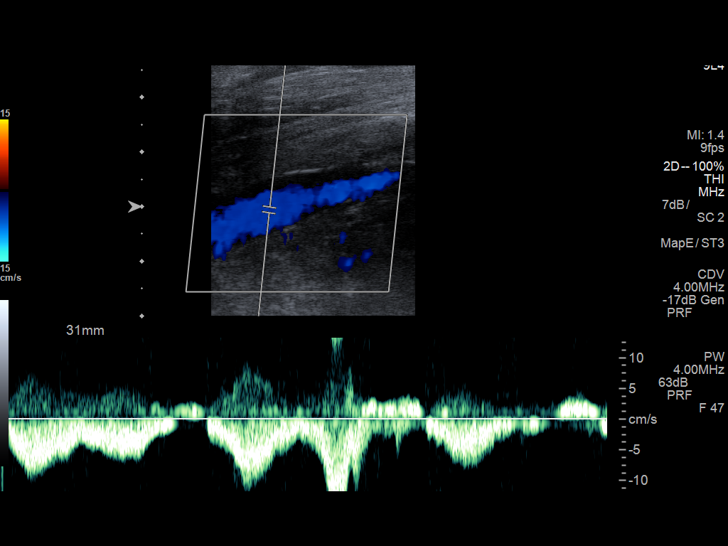
[im 63/86]
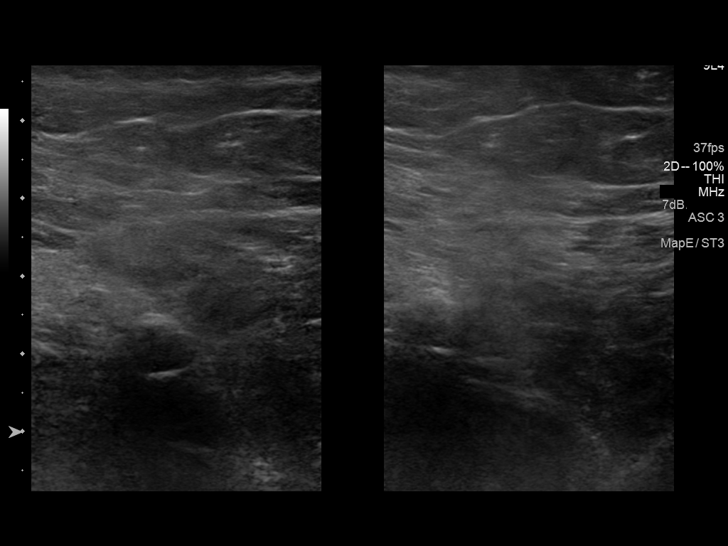
[im 71/86]
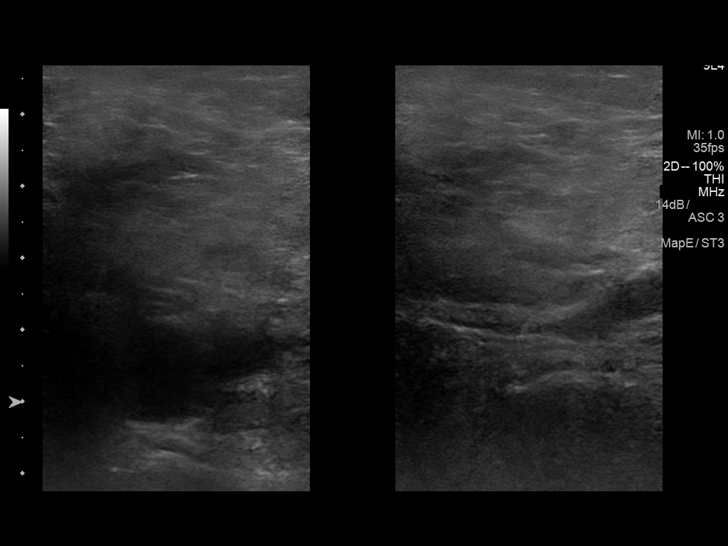
[im 78/86]
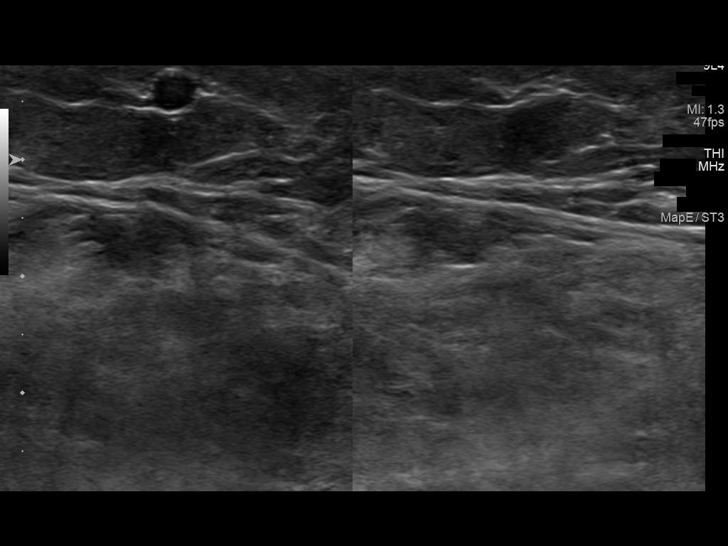
[im 86/86]
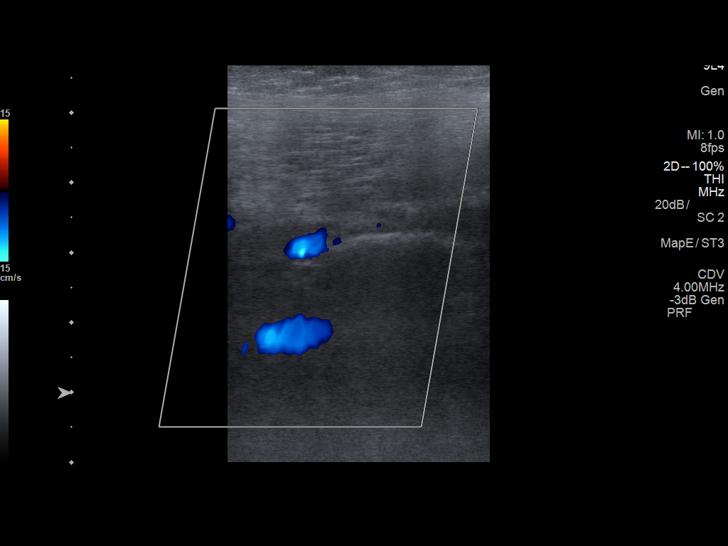

[13 of 24 positions shown; findings below may reference images not displayed]

FINDINGS: RIGHT LOWER EXTREMITY

Common Femoral Vein: No evidence of thrombus. Normal
compressibility, respiratory phasicity and response to augmentation.

Saphenofemoral Junction: No evidence of thrombus. Normal
compressibility and flow on color Doppler imaging.

Profunda Femoral Vein: No evidence of thrombus. Normal
compressibility and flow on color Doppler imaging.

Femoral Vein: No evidence of thrombus. Normal compressibility,
respiratory phasicity and response to augmentation.

Popliteal Vein: No evidence of thrombus. Normal compressibility,
respiratory phasicity and response to augmentation.

Calf Veins: No evidence of thrombus. Normal compressibility and flow
on color Doppler imaging.

Superficial Great Saphenous Vein: No evidence of thrombus. Normal
compressibility.

Venous Reflux:  None.

Other Findings: Right popliteal fossa Baker cyst measures 5.5 x
x 1.3 cm

LEFT LOWER EXTREMITY

Common Femoral Vein: No evidence of thrombus. Normal
compressibility, respiratory phasicity and response to augmentation.

Saphenofemoral Junction: No evidence of thrombus. Normal
compressibility and flow on color Doppler imaging.

Profunda Femoral Vein: No evidence of thrombus. Normal
compressibility and flow on color Doppler imaging.

Femoral Vein: No evidence of thrombus. Normal compressibility,
respiratory phasicity and response to augmentation.

Popliteal Vein: No evidence of thrombus. Normal compressibility,
respiratory phasicity and response to augmentation.

Calf Veins: No evidence of thrombus. Normal compressibility and flow
on color Doppler imaging.

Superficial Great Saphenous Vein: No evidence of thrombus. Normal
compressibility.

Venous Reflux:  None.

Other Findings:  None.
IMPRESSION: No evidence of deep venous thrombosis.

5.5 cm right Baker's cyst.

## 2019-06-30 NOTE — Telephone Encounter (Signed)
Doesn't qualify for home health aide/custodial services, would have to pay out of pocket. Will advise her per Antony Madura to reach out to her church for someone willing to help

## 2019-06-30 NOTE — Telephone Encounter (Signed)
Nikki with [facility] requesting a call back from brandi at [phone]

## 2019-06-30 NOTE — Telephone Encounter (Signed)
[facility] received for "High" LV threshold. Capture management is programmed on monitor only, LV output fixed at 3.25V @ 1.85ms. LV impedance trends stable. LV threshold at last clinic check on 05/16/19 was 2.75V @ 1.62ms.  Spoke with patient. She reports she has been feeling fairly well. Pt is agreeable to a DC appointment to assess threshold manually. She will see if someone can bring her on 07/03/19 at 8:30am. Pt agrees to call back tomorrow to let us know if she can take this appointment. Direct number given, no further questions at this time.

## 2019-07-01 NOTE — Progress Notes (Signed)
EPIC Encounter for ICM Monitoring  Patient Name: [name] [name] is a 85 y.o. female Date: 07/01/2019 Primary Care Physican: Fayrene Helper, MD Primary Cardiologist:Croitoru Electrophysiologist:Taylor Bi-V Pacing:99.9% 8/11/2020Weight:190lbs   Spoke with patient and she is feeling fine. Patient was called 06/30/2019 by Levander Campion, RN device clinic due to transmission alert for High LV threshold measured on 30-Jun-2019.  Patient has 10/8 appt in device clinic.  Optivol thoracic impedancetrending just below baseline normal.  Prescribed:Furosemide40 mg 1 tablet daily. Metolazaone 2.5 mg Take 1tablet twicea week (Wednesday and Saturdays). Potassium 20 mEq take 1 tablet daily.  Labs: 05/31/2019 Creatinine 1.48, BUN 45, Potassium 4.4, Sodium 132, GFR 32-37 05/30/2019 Creatinine 1.54, BUN 47, Potassium 4.2, Sodium 131, GFR 30-35  05/29/2019 Creatinine 1.86, BUN 50, Potassium 4.6, Sodium 129, GFR 24-28  04/10/2019 Creatinine 1.64, BUN 47, Potassium 3.8, Sodium 135, GFR 28-33 03/20/2019 Creatinine1.62, BUN45, Potassium4.5, Sodium130, [id number] A complete set of results can be found in Results Review.  Recommendations:  Encouraged to call if experiencing fluid symptoms.  Follow-up plan: ICM clinic phone appointment on 08/04/2019.   91 day device clinic remote transmission 08/15/2019.   Copy of ICM check sent to Dr. Lovena Le.   3 month ICM trend: 06/30/2019    1 Year ICM trend:       [name], RN 07/01/2019 10:30 AM

## 2019-07-01 NOTE — Telephone Encounter (Signed)
Pt accepted the 07/03/2019 appointment and will need to bring her niece. The pt states she walks with a cane and needs help with her oxygen tank. I told her I will make them aware that her niece will be with her. The pt verbalized understanding.

## 2019-07-02 ENCOUNTER — Other Ambulatory Visit: Payer: Self-pay

## 2019-07-02 ENCOUNTER — Ambulatory Visit (INDEPENDENT_AMBULATORY_CARE_PROVIDER_SITE_OTHER): Payer: Medicare Other | Admitting: *Deleted

## 2019-07-02 DIAGNOSIS — Z952 Presence of prosthetic heart valve: Secondary | ICD-10-CM | POA: Diagnosis not present

## 2019-07-02 DIAGNOSIS — Z5181 Encounter for therapeutic drug level monitoring: Secondary | ICD-10-CM

## 2019-07-02 LAB — POCT INR: INR: 1.8 — AB (ref 2.0–3.0)

## 2019-07-02 NOTE — Patient Instructions (Signed)
Increase coumadin to 2 tablets daily except 1 tablet on Tuesdays and Saturdays.  Continue greens Recheck in 3 weeks

## 2019-07-03 ENCOUNTER — Ambulatory Visit (HOSPITAL_COMMUNITY): Payer: Medicare Other

## 2019-07-03 ENCOUNTER — Other Ambulatory Visit (HOSPITAL_COMMUNITY): Payer: Medicare Other

## 2019-07-03 ENCOUNTER — Ambulatory Visit (INDEPENDENT_AMBULATORY_CARE_PROVIDER_SITE_OTHER): Payer: Medicare Other | Admitting: *Deleted

## 2019-07-03 DIAGNOSIS — I442 Atrioventricular block, complete: Secondary | ICD-10-CM

## 2019-07-07 ENCOUNTER — Other Ambulatory Visit: Payer: Self-pay

## 2019-07-07 ENCOUNTER — Encounter (HOSPITAL_COMMUNITY): Payer: Self-pay

## 2019-07-07 ENCOUNTER — Inpatient Hospital Stay (HOSPITAL_COMMUNITY): Payer: Medicare Other | Attending: Hematology

## 2019-07-07 ENCOUNTER — Inpatient Hospital Stay (HOSPITAL_COMMUNITY): Payer: Medicare Other

## 2019-07-07 ENCOUNTER — Other Ambulatory Visit (HOSPITAL_COMMUNITY)
Admission: RE | Admit: 2019-07-07 | Discharge: 2019-07-07 | Disposition: A | Discharge status: Home / Self Care | Payer: Medicare Other | Source: Ambulatory Visit | Attending: Family Medicine | Admitting: Family Medicine

## 2019-07-07 VITALS — BP 151/54 | HR 68 | Temp 97.1°F | Resp 18

## 2019-07-07 DIAGNOSIS — E890 Postprocedural hypothyroidism: Secondary | ICD-10-CM | POA: Insufficient documentation

## 2019-07-07 DIAGNOSIS — I10 Essential (primary) hypertension: Secondary | ICD-10-CM | POA: Insufficient documentation

## 2019-07-07 DIAGNOSIS — N184 Chronic kidney disease, stage 4 (severe): Secondary | ICD-10-CM | POA: Diagnosis not present

## 2019-07-07 DIAGNOSIS — D631 Anemia in chronic kidney disease: Secondary | ICD-10-CM | POA: Diagnosis not present

## 2019-07-07 DIAGNOSIS — E785 Hyperlipidemia, unspecified: Secondary | ICD-10-CM | POA: Insufficient documentation

## 2019-07-07 DIAGNOSIS — D649 Anemia, unspecified: Secondary | ICD-10-CM

## 2019-07-07 DIAGNOSIS — E611 Iron deficiency: Secondary | ICD-10-CM

## 2019-07-07 LAB — COMPREHENSIVE METABOLIC PANEL
ALT: 12 U/L (ref 0–44)
AST: 24 U/L (ref 15–41)
Albumin: 3.7 g/dL (ref 3.5–5.0)
Alkaline Phosphatase: 48 U/L (ref 38–126)
Anion gap: 10 (ref 5–15)
BUN: 54 mg/dL — ABNORMAL HIGH (ref 8–23)
CO2: 29 mmol/L (ref 22–32)
Calcium: 9.5 mg/dL (ref 8.9–10.3)
Chloride: 93 mmol/L — ABNORMAL LOW (ref 98–111)
Creatinine, Ser: 1.82 mg/dL — ABNORMAL HIGH (ref 0.44–1.00)
GFR calc Af Amer: 29 mL/min — ABNORMAL LOW (ref >60)
GFR calc non Af Amer: 25 mL/min — ABNORMAL LOW (ref >60)
Glucose, Bld: 81 mg/dL (ref 70–99)
Potassium: 4.4 mmol/L (ref 3.5–5.1)
Sodium: 132 mmol/L — ABNORMAL LOW (ref 135–145)
Total Bilirubin: 0.6 mg/dL (ref 0.3–1.2)
Total Protein: 7.7 g/dL (ref 6.5–8.1)

## 2019-07-07 LAB — CBC
HCT: 29.1 % — ABNORMAL LOW (ref 36.0–46.0)
Hemoglobin: 9 g/dL — ABNORMAL LOW (ref 12.0–15.0)
MCH: 29.3 pg (ref 26.0–34.0)
MCHC: 30.9 g/dL (ref 30.0–36.0)
MCV: 94.8 fL (ref 80.0–100.0)
Platelets: 172 10*3/uL (ref 150–400)
RBC: 3.07 MIL/uL — ABNORMAL LOW (ref 3.87–5.11)
RDW: 16.2 % — ABNORMAL HIGH (ref 11.5–15.5)
WBC: 3.7 10*3/uL — ABNORMAL LOW (ref 4.0–10.5)
nRBC: 0 % (ref 0.0–0.2)

## 2019-07-07 LAB — LIPID PANEL
Cholesterol: 123 mg/dL (ref 0–200)
HDL: 48 mg/dL (ref >40)
LDL Cholesterol: 63 mg/dL (ref 0–99)
Total CHOL/HDL Ratio: 2.6 RATIO
Triglycerides: 61 mg/dL (ref <150)
VLDL: 12 mg/dL (ref 0–40)

## 2019-07-07 LAB — TSH: TSH: 0.795 u[IU]/mL (ref 0.350–4.500)

## 2019-07-07 MED ORDER — EPOETIN ALFA-EPBX 40000 UNIT/ML IJ SOLN
40000.0000 [IU] | Freq: Once | INTRAMUSCULAR | Status: AC
  Administered 2019-07-07: 40000 [IU] via SUBCUTANEOUS
  Filled 2019-07-07: qty 1

## 2019-07-07 NOTE — Progress Notes (Signed)
Patient tolerated injection with no complaints voiced.  Site clean and dry with no bruising or swelling noted at site.  Band aid applied.  VSS with discharge and left in a wheel chair with no s/s of distress noted. 

## 2019-07-09 ENCOUNTER — Ambulatory Visit (HOSPITAL_COMMUNITY)
Admission: RE | Admit: 2019-07-09 | Discharge: 2019-07-09 | Disposition: A | Discharge status: Home / Self Care | Payer: Medicare Other | Source: Ambulatory Visit | Attending: Family Medicine | Admitting: Family Medicine

## 2019-07-09 ENCOUNTER — Other Ambulatory Visit: Payer: Self-pay

## 2019-07-09 ENCOUNTER — Ambulatory Visit (HOSPITAL_COMMUNITY): Payer: Medicare Other

## 2019-07-09 DIAGNOSIS — R928 Other abnormal and inconclusive findings on diagnostic imaging of breast: Secondary | ICD-10-CM | POA: Insufficient documentation

## 2019-07-09 DIAGNOSIS — R922 Inconclusive mammogram: Secondary | ICD-10-CM | POA: Diagnosis not present

## 2019-07-11 ENCOUNTER — Other Ambulatory Visit: Payer: Self-pay

## 2019-07-11 LAB — CUP PACEART INCLINIC DEVICE CHECK
Battery Remaining Longevity: 68 mo
Battery Voltage: 3.02 V
Brady Statistic AP VP Percent: 0 %
Brady Statistic AP VS Percent: 0 %
Brady Statistic AS VP Percent: 99.82 %
Brady Statistic AS VS Percent: 0.18 %
Brady Statistic RA Percent Paced: 0 %
Brady Statistic RV Percent Paced: 99.81 %
Date Time Interrogation Session: 20201008124123
Implantable Lead Implant Date: 20040521
Implantable Lead Implant Date: 20071218
Implantable Lead Implant Date: 20160516
Implantable Lead Location: 753858
Implantable Lead Location: 753859
Implantable Lead Location: 753860
Implantable Lead Model: 5076
Implantable Lead Model: 6947
Implantable Pulse Generator Implant Date: 20200506
Lead Channel Impedance Value: 285 Ohm
Lead Channel Impedance Value: 285 Ohm
Lead Channel Impedance Value: 323 Ohm
Lead Channel Impedance Value: 361 Ohm
Lead Channel Impedance Value: 627 Ohm
Lead Channel Impedance Value: 646 Ohm
Lead Channel Impedance Value: 684 Ohm
Lead Channel Impedance Value: 722 Ohm
Lead Channel Impedance Value: 741 Ohm
Lead Channel Impedance Value: 760 Ohm
Lead Channel Pacing Threshold Amplitude: 1.25 V
Lead Channel Pacing Threshold Amplitude: 2.5 V
Lead Channel Pacing Threshold Pulse Width: 0.8 ms
Lead Channel Pacing Threshold Pulse Width: 1 ms
Lead Channel Setting Pacing Amplitude: 3 V
Lead Channel Setting Pacing Amplitude: 3.25 V
Lead Channel Setting Pacing Pulse Width: 0.8 ms
Lead Channel Setting Pacing Pulse Width: 1 ms
Lead Channel Setting Sensing Sensitivity: 5.6 mV

## 2019-07-11 MED ORDER — FLUTICASONE PROPIONATE 50 MCG/ACT NA SUSP: NASAL | 1 refills | Status: DC

## 2019-07-11 NOTE — Progress Notes (Signed)
CRT-P device check in clinic. Normal device function. RV,LV, sensing, impedance consistent with previous measurements. RV threshold consistent. LV threshold trending up, vector thresholds  tested with industry. Per GT LV polarity changed from LV2 to LV3 to LV3 to LV2 due to capture threshold of 2.5 V @ 1 ms. Histograms appropriate for patient and level of activity. No ventricular high rate episodes. Patient bi-ventricularly pacing 99.8% ( Effective 82.1%)of the time. Device programmed with appropriate safety margins. Device heart failure diagnostics are within normal limits and stable over time. Estimated longevity 5 yrs 8 months. Patient enrolled in remote follow-up and next remote 08/04/19.

## 2019-07-18 ENCOUNTER — Other Ambulatory Visit (HOSPITAL_COMMUNITY): Payer: Self-pay | Admitting: *Deleted

## 2019-07-18 DIAGNOSIS — D649 Anemia, unspecified: Secondary | ICD-10-CM

## 2019-07-23 ENCOUNTER — Ambulatory Visit (INDEPENDENT_AMBULATORY_CARE_PROVIDER_SITE_OTHER): Payer: Medicare Other | Admitting: *Deleted

## 2019-07-23 ENCOUNTER — Other Ambulatory Visit: Payer: Self-pay

## 2019-07-23 DIAGNOSIS — Z952 Presence of prosthetic heart valve: Secondary | ICD-10-CM

## 2019-07-23 DIAGNOSIS — Z5181 Encounter for therapeutic drug level monitoring: Secondary | ICD-10-CM

## 2019-07-23 LAB — POCT INR: INR: 1.9 — AB (ref 2.0–3.0)

## 2019-07-23 NOTE — Patient Instructions (Signed)
Continue coumadin 2 tablets daily except 1 tablet on Tuesdays and Saturdays.  Continue greens Recheck in 3 weeks

## 2019-07-30 DIAGNOSIS — N184 Chronic kidney disease, stage 4 (severe): Secondary | ICD-10-CM | POA: Diagnosis not present

## 2019-07-30 DIAGNOSIS — R809 Proteinuria, unspecified: Secondary | ICD-10-CM | POA: Diagnosis not present

## 2019-07-30 DIAGNOSIS — I10 Essential (primary) hypertension: Secondary | ICD-10-CM | POA: Diagnosis not present

## 2019-07-30 DIAGNOSIS — Z79899 Other long term (current) drug therapy: Secondary | ICD-10-CM | POA: Diagnosis not present

## 2019-07-31 ENCOUNTER — Ambulatory Visit (HOSPITAL_COMMUNITY): Payer: Medicare Other

## 2019-07-31 ENCOUNTER — Ambulatory Visit (HOSPITAL_COMMUNITY): Payer: Medicare Other | Admitting: Nurse Practitioner

## 2019-07-31 ENCOUNTER — Other Ambulatory Visit (HOSPITAL_COMMUNITY): Payer: Medicare Other

## 2019-08-01 ENCOUNTER — Other Ambulatory Visit: Payer: Self-pay

## 2019-08-01 DIAGNOSIS — N184 Chronic kidney disease, stage 4 (severe): Secondary | ICD-10-CM

## 2019-08-01 MED ORDER — POTASSIUM CHLORIDE CRYS ER 20 MEQ PO TBCR: 20.0000 meq | EXTENDED_RELEASE_TABLET | Freq: Every day | ORAL | 1 refills | Status: DC

## 2019-08-02 ENCOUNTER — Emergency Department (HOSPITAL_COMMUNITY)
Admission: EM | Admit: 2019-08-02 | Discharge: 2019-08-03 | Disposition: A | Discharge status: Home / Self Care | Payer: Medicare Other | Attending: Emergency Medicine | Admitting: Emergency Medicine

## 2019-08-02 ENCOUNTER — Encounter (HOSPITAL_COMMUNITY): Payer: Self-pay | Admitting: Emergency Medicine

## 2019-08-02 ENCOUNTER — Other Ambulatory Visit: Payer: Self-pay

## 2019-08-02 ENCOUNTER — Emergency Department (HOSPITAL_COMMUNITY): Payer: Medicare Other

## 2019-08-02 DIAGNOSIS — I130 Hypertensive heart and chronic kidney disease with heart failure and stage 1 through stage 4 chronic kidney disease, or unspecified chronic kidney disease: Secondary | ICD-10-CM | POA: Diagnosis not present

## 2019-08-02 DIAGNOSIS — N184 Chronic kidney disease, stage 4 (severe): Secondary | ICD-10-CM | POA: Insufficient documentation

## 2019-08-02 DIAGNOSIS — E1122 Type 2 diabetes mellitus with diabetic chronic kidney disease: Secondary | ICD-10-CM | POA: Insufficient documentation

## 2019-08-02 DIAGNOSIS — Z95810 Presence of automatic (implantable) cardiac defibrillator: Secondary | ICD-10-CM | POA: Diagnosis not present

## 2019-08-02 DIAGNOSIS — Z87891 Personal history of nicotine dependence: Secondary | ICD-10-CM | POA: Insufficient documentation

## 2019-08-02 DIAGNOSIS — Z7901 Long term (current) use of anticoagulants: Secondary | ICD-10-CM | POA: Insufficient documentation

## 2019-08-02 DIAGNOSIS — R072 Precordial pain: Secondary | ICD-10-CM | POA: Insufficient documentation

## 2019-08-02 DIAGNOSIS — I5032 Chronic diastolic (congestive) heart failure: Secondary | ICD-10-CM | POA: Diagnosis not present

## 2019-08-02 DIAGNOSIS — Z79899 Other long term (current) drug therapy: Secondary | ICD-10-CM | POA: Diagnosis not present

## 2019-08-02 DIAGNOSIS — J449 Chronic obstructive pulmonary disease, unspecified: Secondary | ICD-10-CM | POA: Diagnosis not present

## 2019-08-02 DIAGNOSIS — N644 Mastodynia: Secondary | ICD-10-CM | POA: Diagnosis present

## 2019-08-02 DIAGNOSIS — E039 Hypothyroidism, unspecified: Secondary | ICD-10-CM | POA: Diagnosis not present

## 2019-08-02 DIAGNOSIS — R079 Chest pain, unspecified: Secondary | ICD-10-CM | POA: Diagnosis not present

## 2019-08-02 LAB — CBC
HCT: 28.4 % — ABNORMAL LOW (ref 36.0–46.0)
Hemoglobin: 8.8 g/dL — ABNORMAL LOW (ref 12.0–15.0)
MCH: 29.4 pg (ref 26.0–34.0)
MCHC: 31 g/dL (ref 30.0–36.0)
MCV: 95 fL (ref 80.0–100.0)
Platelets: 163 10*3/uL (ref 150–400)
RBC: 2.99 MIL/uL — ABNORMAL LOW (ref 3.87–5.11)
RDW: 15.9 % — ABNORMAL HIGH (ref 11.5–15.5)
WBC: 3.3 10*3/uL — ABNORMAL LOW (ref 4.0–10.5)
nRBC: 0 % (ref 0.0–0.2)

## 2019-08-02 LAB — BASIC METABOLIC PANEL
Anion gap: 9 (ref 5–15)
BUN: 43 mg/dL — ABNORMAL HIGH (ref 8–23)
CO2: 29 mmol/L (ref 22–32)
Calcium: 9.6 mg/dL (ref 8.9–10.3)
Chloride: 96 mmol/L — ABNORMAL LOW (ref 98–111)
Creatinine, Ser: 1.44 mg/dL — ABNORMAL HIGH (ref 0.44–1.00)
GFR calc Af Amer: 38 mL/min — ABNORMAL LOW (ref >60)
GFR calc non Af Amer: 33 mL/min — ABNORMAL LOW (ref >60)
Glucose, Bld: 97 mg/dL (ref 70–99)
Potassium: 4.3 mmol/L (ref 3.5–5.1)
Sodium: 134 mmol/L — ABNORMAL LOW (ref 135–145)

## 2019-08-02 LAB — TROPONIN I (HIGH SENSITIVITY): Troponin I (High Sensitivity): 27 ng/L — ABNORMAL HIGH (ref <18)

## 2019-08-02 NOTE — ED Provider Notes (Addendum)
[facility] EMERGENCY DEPARTMENT Provider Note   CSN: [id number] Arrival date & time: 08/02/19  1545     History   Chief Complaint Chief Complaint  Patient presents with  . Breast Pain    left    HPI Bahja Bence is a 85 y.o. female.     Patient with a complaint of left anterior chest pain tonight.  The pain has been at most 15 minutes.  There was 2 episodes last night there may have been an episode this morning after the son came up.  Patient did not have any complaint of the pain during the daytime.  No pain in the afternoon no pain this evening.  Patient without fevers no shortness of breath no nausea or vomiting or diarrhea.  Pain was nonradiating.  Patient stated the pain was 8 out of 10 when it was there.  No pain now.  Past medical history is significant for nonischemic cardiomyopathy.  Patient does have a pacemaker.  Patient has a history of COPD congestive heart failure.  History of complete heart block in 2016.  History of diabetes has chronic kidney disease.  Not on dialysis.  Patient has a mechanical valve AVR and mechanical MVR which was done in 2004.     Past Medical History:  Diagnosis Date  . Adenomatous polyp of colon 12/16/2002   Dr. [name]/[facility]  . Allergy   . Cataract   . CHB (complete heart block) ([place]) 10/07/2014      . CHF (congestive heart failure) ([place])    a.  EF previously reduced to 25-30% b. EF improved to 60-65% by echo in 10/2017.   Marland Kitchen Chronic kidney disease, stage I 2006  . Constipation       . COPD (chronic obstructive pulmonary disease) ([place])       . DJD (degenerative joint disease) of lumbar spine   . DM (diabetes mellitus) ([place]) 2006  . Esophageal reflux   . Glaucoma   . Gout       . H/O heart valve replacement with mechanical valve    a. s/p mechanical AVR in 2001 and mechanical MVR in 2004.  Marland Kitchen H/O wisdom tooth extraction   . Hyperlipemia   . IDA (iron deficiency anemia)    Parenteral iron/Dr.  Tressie Stalker  . Insomnia       . Non-ischemic cardiomyopathy ([place])    a. s/p MDT CRTD  . Obesity, unspecified   . Other and unspecified hyperlipidemia   . Oxygen deficiency 2014   nocturnal;  . Spondylolisthesis   . Spondylosis   . Thyroid cancer ([place])    remote thyroidectomy, no recurrence, pt denies in 2016 thyroid cancer  . Type 2 diabetes mellitus with hyperglycemia ([place]) 07/15/2010  . Unspecified hypothyroidism       . 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 Vertigo         Patient Active Problem List   Diagnosis Date Noted  . Acute right-sided thoracic back pain   . Dyspnea 05/30/2019  . Anemia 05/30/2019  . Gout   . Glaucoma   . GERD (gastroesophageal reflux disease)   . Physical debility 04/28/2019  . Hyponatremia 09/27/2018  . H/O mitral valve replacement with mechanical valve 08/02/2018  . Chronic right hip pain 04/16/2018  . Trochanteric bursitis, right hip 03/07/2017  . Persistent atrial fibrillation ([place])   . Nocturnal hypoxia 10/08/2016  . Dependence on nocturnal oxygen therapy 10/08/2016  . Hematuria 03/20/2016  .  S/P ICD (internal cardiac defibrillator) procedure 02/08/2015  . Presence of cardiac pacemaker 02/08/2015  . CHB (complete heart block) ([place]) 10/07/2014  . Fatigue 07/20/2014  . Absolute anemia 06/24/2014  . Osteopenia 02/10/2014  . Encounter for therapeutic drug monitoring 10/29/2013  . Spinal stenosis of lumbar region 02/19/2013  . OA (osteoarthritis) of knee 02/19/2013  . Chronic pain of right knee 01/02/2013  . Hemolytic anemia ([place]) 09/24/2012  . H/O adenomatous polyp of colon 05/24/2012  . High risk medication use 05/24/2012  . Acute on chronic diastolic CHF (congestive heart failure) ([place]) 05/19/2012  . Chronic anticoagulation, on coumadin for Mechanical AVR and MVR 04/01/2012  . Cardiorenal syndrome with renal failure 03/22/2012  . Hx of aortic valve replacement, mechanical 03/22/2012  . S/P MVR (mitral  valve replacement) 03/22/2012  . Biventricular automatic implantable cardioverter defibrillator in situ 03/22/2012  . CKD (chronic kidney disease), stage IV ([place]) 03/22/2012  . Warfarin-induced coagulopathy ([place]) 03/21/2012  . Papilloma of breast 03/06/2012  . Iron deficiency 10/04/2011  . Allergic rhinitis 02/14/2011  . Hypothyroidism 07/15/2010  . Mitral valve disease 07/15/2010  . Sinus node dysfunction ([place]) 07/15/2010  . Nausea without vomiting 06/07/2010  . HIP PAIN, RIGHT 04/23/2009  . VARICOSE VEINS LOWER EXTREMITIES W/OTH COMPS 02/09/2009  . Calculus of gallbladder with chronic cholecystitis without obstruction 02/09/2009  . Prediabetes 06/15/2008  . Hypothyroidism, postsurgical 02/24/2008  . Hyperlipidemia LDL goal <70 02/24/2008  . Essential hypertension 02/24/2008  . Non-ischemic cardiomyopathy with ICD 02/24/2008  . GERD 02/24/2008  . DEGENERATIVE JOINT DISEASE, SPINE 02/24/2008    Past Surgical History:  Procedure Laterality Date  . AORTIC AND MITRAL VALVE REPLACEMENT  2001  . BI-VENTRICULAR IMPLANTABLE CARDIOVERTER DEFIBRILLATOR UPGRADE N/A 01/18/2015   Procedure: BI-VENTRICULAR IMPLANTABLE CARDIOVERTER DEFIBRILLATOR UPGRADE;  Surgeon: Evans Lance, MD;  Location: [facility] CATH LAB;  Service: Cardiovascular;  Laterality: N/A;  . BIV ICD GENERATOR CHANGEOUT N/A 01/30/2019   Procedure: BIV ICD GENERATOR CHANGEOUT;  Surgeon: Evans Lance, MD;  Location: [place] CV LAB;  Service: Cardiovascular;  Laterality: N/A;  . CARDIAC CATHETERIZATION    . CARDIAC VALVE REPLACEMENT    . CARDIOVERSION N/A 11/13/2016   Procedure: CARDIOVERSION;  Surgeon: Sanda Klein, MD;  Location: [facility] ENDOSCOPY;  Service: Cardiovascular;  Laterality: N/A;  . COLONOSCOPY  06/12/2007   Rourk- Normal rectum/Normal colon  . COLONOSCOPY  12/16/02   small hemorrhoids  . COLONOSCOPY  06/20/2012   Procedure: COLONOSCOPY;  Surgeon: Daneil Dolin, MD;  Location: [facility] ENDO SUITE;  Service: Endoscopy;   Laterality: N/A;  10:45  . defibrillator implanted 2006    . DOPPLER ECHOCARDIOGRAPHY  2012  . DOPPLER ECHOCARDIOGRAPHY  05,06,07,08,09,2011  . ESOPHAGOGASTRODUODENOSCOPY  06/12/2007   Rourk- normal esophagus, small hiatal hernia, otherwise normal stomach, D1, D2  . EYE SURGERY Right 2005  . EYE SURGERY Left 2006  . ICD LEAD REMOVAL Left 02/08/2015   Procedure: ICD LEAD REMOVAL;  Surgeon: Evans Lance, MD;  Location: [facility] OR;  Service: Cardiovascular;  Laterality: Left;  "Will plan extraction and insertion of a BiV PM"  **Dr. Roxan Hockey backing up case**  . IMPLANTABLE CARDIOVERTER DEFIBRILLATOR (ICD) GENERATOR CHANGE Left 02/08/2015   Procedure: ICD GENERATOR CHANGE;  Surgeon: Evans Lance, MD;  Location: [place];  Service: Cardiovascular;  Laterality: Left;  . INSERT / REPLACE / REMOVE PACEMAKER    . PACEMAKER INSERTION  May 2016  . pacemaker placed  2004  . right breast cyst removed     benign   .  right cataract removed     2005  . TEE WITHOUT CARDIOVERSION N/A 11/13/2016   Procedure: TRANSESOPHAGEAL ECHOCARDIOGRAM (TEE);  Surgeon: Sanda Klein, MD;  Location: [facility] ENDOSCOPY;  Service: Cardiovascular;  Laterality: N/A;  . THYROIDECTOMY       OB History    Gravida      Para      Term      Preterm      AB      Living  0     SAB      TAB      Ectopic      Multiple      Live Births               Home Medications    Prior to Admission medications   Medication Sig Start Date End Date Taking? Authorizing Provider  acetaminophen (TYLENOL) 500 MG tablet Take 500 mg by mouth every 6 (six) hours as needed (pain).   Yes [provider]  albuterol (PROVENTIL) (2.5 MG/3ML) 0.083% nebulizer solution INHALE 1 VIAL VIA NEBULIZER THREE TIMES DAILY. Patient taking differently: Take 2.5 mg by nebulization every 6 (six) hours as needed for wheezing or shortness of breath.  12/28/16  Yes Fayrene Helper, MD  albuterol (VENTOLIN HFA) 108 (90 Base) MCG/ACT inhaler  INHALE 2 PUFFS INTO THE LUNGS EVERY 6 HOURS AS NEEDED FOR WHEEZING OR SHORTNESS OF BREATH Patient taking differently: Inhale 2 puffs into the lungs every 6 (six) hours as needed for wheezing.  06/11/19  Yes Fayrene Helper, MD  allopurinol (ZYLOPRIM) 100 MG tablet TAKE 1 TABLET(100 MG) BY MOUTH DAILY Patient taking differently: Take 100 mg by mouth daily.  06/11/19  Yes Fayrene Helper, MD  BREO ELLIPTA 100-25 MCG/INH AEPB 1 TAKE 1 INHALATION BY MOUTH ONCE DAILY Patient taking differently: Inhale 1 puff into the lungs daily as needed (for shortness of breath).  05/20/19  Yes Fayrene Helper, MD  cholecalciferol (VITAMIN D3) 25 MCG (1000 UT) tablet Take 1,000 Units by mouth daily.   Yes [provider]  docusate sodium (COLACE) 100 MG capsule Take 300 mg by mouth at bedtime.    Yes [provider]  fluticasone (FLONASE) 50 MCG/ACT nasal spray SHAKE LIQUID AND USE 2 SPRAYS IN EACH NOSTRIL DAILY Patient taking differently: Place 2 sprays into both nostrils daily.  07/11/19  Yes Fayrene Helper, MD  folic acid (FOLVITE) 1 MG tablet Take 1 tablet (1 mg total) by mouth daily. 06/11/19  Yes Fayrene Helper, MD  furosemide (LASIX) 40 MG tablet Take 1 tablet (40 mg total) by mouth daily. 05/31/19  Yes Barton Dubois, MD  gabapentin (NEURONTIN) 300 MG capsule Take 1 capsule (300 mg total) by mouth 2 (two) times daily. 05/31/19  Yes Barton Dubois, MD  hydrALAZINE (APRESOLINE) 25 MG tablet Take 1 tablet (25 mg total) by mouth 2 (two) times daily. 05/19/19  Yes Croitoru, Mihai, MD  HYDROcodone-acetaminophen (NORCO) 7.5-325 MG tablet Take 1 tablet by mouth every 6 (six) hours as needed for moderate pain (Must last 30 days.). 06/24/19  Yes Carole Civil, MD  isosorbide mononitrate (IMDUR) 30 MG 24 hr tablet Take 1 tablet (30 mg total) by mouth daily. 05/23/19 08/21/19 Yes Croitoru, Mihai, MD  Liniments (SALONPAS PAIN RELIEF PATCH EX) Apply 1 patch topically daily as needed (pain).    Yes [provider]  meclizine (ANTIVERT) 12.5 MG tablet Take 1 tablet (12.5 mg total) by mouth 3 (three) times daily as  needed for dizziness. 06/23/19  Yes Fayrene Helper, MD  metolazone (ZAROXOLYN) 2.5 MG tablet Take 1 tablet (2.5 mg total) by mouth 2 (two) times a week. Wednesdays and Saturdays Skip dose on 1/4 and start from 1/8 Patient taking differently: Take 2.5 mg by mouth 2 (two) times a week. Wednesdays and Saturdays 05/26/19  Yes Croitoru, Mihai, MD  metoprolol succinate (TOPROL-XL) 100 MG 24 hr tablet TAKE 1 TABLET BY MOUTH DAILY. TAKE WITH OR IMMEDIATELY FOLLOWING A MEAL. Patient taking differently: Take 100 mg by mouth daily. TAKE WITH OR IMMEDIATELY FOLLOWING A MEAL. 06/11/19  Yes Fayrene Helper, MD  Multiple Vitamin (MULTIVITAMIN) LIQD Take 5 mLs by mouth daily.   Yes [provider]  ondansetron (ZOFRAN) 4 MG tablet Take 1 tablet (4 mg total) by mouth daily as needed for nausea or vomiting. 06/11/19  Yes Fayrene Helper, MD  pantoprazole (PROTONIX) 40 MG tablet Take 1 tablet (40 mg total) by mouth daily. 06/01/19  Yes Barton Dubois, MD  Polyethyl [name] (SYSTANE) 0.4-0.3 % GEL ophthalmic gel Place 1 application into the left eye at bedtime.   Yes [provider]  potassium chloride SA (KLOR-CON) 20 MEQ tablet Take 1 tablet (20 mEq total) by mouth daily. 08/01/19  Yes Fayrene Helper, MD  pravastatin (PRAVACHOL) 40 MG tablet Take 1 tablet (40 mg total) by mouth every evening. 06/11/19  Yes Fayrene Helper, MD  SYNTHROID 75 MCG tablet TAKE 1 TABLET BY MOUTH DAILY Patient taking differently: Take 75 mcg by mouth daily before breakfast.  06/11/19  Yes Fayrene Helper, MD  warfarin (JANTOVEN) 5 MG tablet Take as directed by coumadin clinic Patient taking differently: Take 5-10 mg by mouth See admin instructions. Take 5mg  on Tuesdays and Saturdays. Takes 10mg  on all other days 05/16/19  Yes Croitoru, Mihai, MD    Family History  Family History  Problem Relation Age of Onset  . Pancreatic cancer Mother   . Heart disease Father   . Heart disease Sister   . Heart attack Sister   . Heart disease Brother   . Diabetes Brother   . Heart disease Brother   . Diabetes Brother   . Hypertension Brother   . Lung cancer Brother     Social History Social History   Tobacco Use  . Smoking status: Former Smoker    Packs/day: 0.75    Years: 40.00    Pack years: 30.00    Types: Cigarettes    Quit date: 03/08/1987    Years since quitting: 32.4  . Smokeless tobacco: Never Used  Substance Use Topics  . Alcohol use: No    Alcohol/week: 0.0 standard drinks  . Drug use: No     Allergies   Penicillins, Aspirin, Fentanyl, and Niacin   Review of Systems Review of Systems  Constitutional: Negative for chills and fever.  HENT: Negative for congestion, rhinorrhea and sore throat.   Eyes: Negative for visual disturbance.  Respiratory: Negative for cough and shortness of breath.   Cardiovascular: Positive for chest pain and leg swelling.  Gastrointestinal: Negative for abdominal pain, diarrhea, nausea and vomiting.  Genitourinary: Negative for dysuria.  Musculoskeletal: Negative for back pain and neck pain.  Skin: Negative for rash.  Neurological: Negative for dizziness, light-headedness and headaches.  Hematological: Does not bruise/bleed easily.  Psychiatric/Behavioral: Negative for confusion.     Physical Exam Updated Vital Signs BP 126/67 (BP Location: Right Arm)   Pulse 60   Temp 98.1 F (36.7  C) (Oral)   Resp 16   Ht 1.702 m (5\' 7" )   Wt 88 kg   SpO2 95%   BMI 30.38 kg/m   Physical Exam Vitals signs and nursing note reviewed.  Constitutional:      General: She is not in acute distress.    Appearance: Normal appearance. She is well-developed.  HENT:     Head: Normocephalic and atraumatic.  Eyes:     Extraocular Movements: Extraocular movements intact.     Conjunctiva/sclera: Conjunctivae  normal.     Pupils: Pupils are equal, round, and reactive to light.  Neck:     Musculoskeletal: Neck supple.  Cardiovascular:     Rate and Rhythm: Normal rate and regular rhythm.     Heart sounds: No murmur.  Pulmonary:     Effort: Pulmonary effort is normal. No respiratory distress.     Breath sounds: Normal breath sounds.  Chest:     Chest wall: No tenderness.  Abdominal:     Palpations: Abdomen is soft.     Tenderness: There is no abdominal tenderness.  Musculoskeletal:        General: Swelling present.  Skin:    General: Skin is warm and dry.     Capillary Refill: Capillary refill takes less than 2 seconds.  Neurological:     General: No focal deficit present.     Mental Status: She is alert and oriented to person, place, and time.      ED Treatments / Results  Labs (all labs ordered are listed, but only abnormal results are displayed) Labs Reviewed  CBC  BASIC METABOLIC PANEL  TROPONIN I (HIGH SENSITIVITY)  TROPONIN I (HIGH SENSITIVITY)    EKG EKG Interpretation  Date/Time:  Saturday August 02 2019 16:39:15 EST Ventricular Rate:  60 PR Interval:    QRS Duration: 180 QT Interval:  506 QTC Calculation: 506 R Axis:   133 Text Interpretation: Ventricular-paced rhythm Abnormal ECG Confirmed by Fredia Sorrow [phone]) on 08/02/2019 4:57:13 PM   Radiology Dg Chest Port 1 View  Result Date: 08/02/2019 CLINICAL DATA:  Chest pain EXAM: PORTABLE CHEST 1 VIEW COMPARISON:  05/29/2019 FINDINGS: The heart size is enlarged. There is a dual chamber left-sided pacemaker in place. The lungs are hyperexpanded. There is no pneumothorax. No large pleural effusion. Vascular congestion is noted. There are end-stage degenerative changes of the left glenohumeral joint. IMPRESSION: 1. Cardiomegaly with vascular congestion. 2. Hyperexpanded lungs which can be seen in patients with COPD. Electronically Signed   By: Constance Holster M.D.   On: 08/02/2019 19:52    Procedures  Procedures (including critical care time)  Medications Ordered in ED Medications - No data to display   Initial Impression / Assessment and Plan / ED Course  I have reviewed the triage vital signs and the nursing notes.  Pertinent labs & imaging results that were available during my care of the patient were reviewed by me and considered in my medical decision making (see chart for details).        Patient without any chest pain since this morning.  Patient's troponins have been elevated in the past.  Chest pain at most lasted 15 minutes.  Chest x-ray without acute findings other than some pulmonary congestion.  Patient has a known history of CHF.  Patient without any shortness of breath.  Not hypoxic.  No reproducible pain to the chest.  EKG reflected her ventricular paced rhythm from her pacemaker.  Since patient has not had any pain  for several hours.  The first troponin may be helpful.  If it is in the range of her past wants repeat may not be required.  Patient has renal insufficiency.  Patient is not on dialysis patient would not be a candidate for CT angio to rule out PE but clinically I do not think that that is of concern.  Feel the patient's pain pattern is unlikely to be secondary to an acute cardiac event.  However troponins need to be evaluated.  Along with CBC and basic metabolic panel.  Final Clinical Impressions(s) / ED Diagnoses   Final diagnoses:  Precordial pain    ED Discharge Orders    None       Fredia Sorrow, MD 08/02/19 2307  Patient's labs without significant abnormalities.  Little worsening of her anemia.  But does not meet transfusion criteria.  Patient's troponin high-sensitivity was 27 which is a little higher than where she normally is.  I think it does warrant delta troponin.  If the other 1 is in the similar range and not increasing significantly I think patient can be discharged home.  Have a low concern for acute cardiac event.      Fredia Sorrow, MD 08/02/19 2337

## 2019-08-02 NOTE — ED Triage Notes (Signed)
C/o left breast pain since last night, rating pain 8/10.  Denies SOB, n/v/d or fever.

## 2019-08-03 DIAGNOSIS — R072 Precordial pain: Secondary | ICD-10-CM | POA: Diagnosis not present

## 2019-08-03 LAB — TROPONIN I (HIGH SENSITIVITY): Troponin I (High Sensitivity): 26 ng/L — ABNORMAL HIGH (ref <18)

## 2019-08-03 MED ORDER — NITROGLYCERIN 0.4 MG SL SUBL: 0.4000 mg | SUBLINGUAL_TABLET | SUBLINGUAL | 0 refills | Status: DC | PRN

## 2019-08-03 NOTE — Discharge Instructions (Addendum)
Please call your cardiologist office on Monday, November 9 to let them know about your ED visit and that you are having chest pains.  If you have chest pain again try the nitroglycerin pill.  Sit down when you take it because it can make you lightheaded.  Return to the emergency department if you get chest pain that lasts constantly over 30 minutes.

## 2019-08-03 NOTE — ED Provider Notes (Signed)
Patient left at change of shift to get results of her delta troponin.  They are negative although slightly elevated which is her usual.  Review of the chart shows her cardiologist is Dr. Sallyanne Kuster.  She is already on Imdur.  She was given nitroglycerin sublingual tablets to use if she gets chest pain again.  She was advised to return to the ED if she has chest pain that lasts more than 30 minutes constantly.  Patient is sitting at the side of the stretcher wearing her oxygen.  She is in no distress.  We discussed her discharge instructions.  Results for orders placed or performed during the hospital encounter of 08/02/19  Troponin I (High Sensitivity)  Result Value Ref Range   Troponin I (High Sensitivity) 27 (H) <18 ng/L  Troponin I (High Sensitivity)  Result Value Ref Range   Troponin I (High Sensitivity) 26 (H) <18 ng/L       Diagnoses that have been ruled out:  None  Diagnoses that are still under consideration:  None  Final diagnoses:  Precordial pain   ED Discharge Orders         Ordered    nitroGLYCERIN (NITROSTAT) 0.4 MG SL tablet  Every 5 min PRN     08/03/19 0104         Plan discharge  Rolland Porter, MD, Barbette Or, MD 08/03/19 [phone]

## 2019-08-04 ENCOUNTER — Inpatient Hospital Stay (HOSPITAL_COMMUNITY): Payer: Medicare Other

## 2019-08-04 ENCOUNTER — Ambulatory Visit (INDEPENDENT_AMBULATORY_CARE_PROVIDER_SITE_OTHER): Payer: Medicare Other

## 2019-08-04 ENCOUNTER — Telehealth: Payer: Self-pay

## 2019-08-04 ENCOUNTER — Other Ambulatory Visit: Payer: Self-pay

## 2019-08-04 ENCOUNTER — Encounter (HOSPITAL_COMMUNITY): Payer: Self-pay | Admitting: Nurse Practitioner

## 2019-08-04 ENCOUNTER — Inpatient Hospital Stay (HOSPITAL_COMMUNITY): Payer: Medicare Other | Attending: Nurse Practitioner | Admitting: Nurse Practitioner

## 2019-08-04 ENCOUNTER — Telehealth: Payer: Self-pay | Admitting: Cardiovascular Disease

## 2019-08-04 DIAGNOSIS — N181 Chronic kidney disease, stage 1: Secondary | ICD-10-CM | POA: Insufficient documentation

## 2019-08-04 DIAGNOSIS — E611 Iron deficiency: Secondary | ICD-10-CM | POA: Diagnosis not present

## 2019-08-04 DIAGNOSIS — D649 Anemia, unspecified: Secondary | ICD-10-CM

## 2019-08-04 DIAGNOSIS — I5032 Chronic diastolic (congestive) heart failure: Secondary | ICD-10-CM | POA: Diagnosis not present

## 2019-08-04 DIAGNOSIS — N1832 Chronic kidney disease, stage 3b: Secondary | ICD-10-CM | POA: Insufficient documentation

## 2019-08-04 DIAGNOSIS — Z95810 Presence of automatic (implantable) cardiac defibrillator: Secondary | ICD-10-CM | POA: Diagnosis not present

## 2019-08-04 DIAGNOSIS — N184 Chronic kidney disease, stage 4 (severe): Secondary | ICD-10-CM

## 2019-08-04 DIAGNOSIS — D631 Anemia in chronic kidney disease: Secondary | ICD-10-CM | POA: Diagnosis not present

## 2019-08-04 LAB — COMPREHENSIVE METABOLIC PANEL
ALT: 11 U/L (ref 0–44)
AST: 25 U/L (ref 15–41)
Albumin: 3.9 g/dL (ref 3.5–5.0)
Alkaline Phosphatase: 52 U/L (ref 38–126)
Anion gap: 10 (ref 5–15)
BUN: 38 mg/dL — ABNORMAL HIGH (ref 8–23)
CO2: 28 mmol/L (ref 22–32)
Calcium: 9.4 mg/dL (ref 8.9–10.3)
Chloride: 97 mmol/L — ABNORMAL LOW (ref 98–111)
Creatinine, Ser: 1.45 mg/dL — ABNORMAL HIGH (ref 0.44–1.00)
GFR calc Af Amer: 38 mL/min — ABNORMAL LOW (ref >60)
GFR calc non Af Amer: 33 mL/min — ABNORMAL LOW (ref >60)
Glucose, Bld: 99 mg/dL (ref 70–99)
Potassium: 3.9 mmol/L (ref 3.5–5.1)
Sodium: 135 mmol/L (ref 135–145)
Total Bilirubin: 0.9 mg/dL (ref 0.3–1.2)
Total Protein: 7.9 g/dL (ref 6.5–8.1)

## 2019-08-04 LAB — CBC WITH DIFFERENTIAL/PLATELET
Abs Immature Granulocytes: 0.01 10*3/uL (ref 0.00–0.07)
Basophils Absolute: 0 10*3/uL (ref 0.0–0.1)
Basophils Relative: 1 %
Eosinophils Absolute: 0 10*3/uL (ref 0.0–0.5)
Eosinophils Relative: 0 %
HCT: 30.4 % — ABNORMAL LOW (ref 36.0–46.0)
Hemoglobin: 9.4 g/dL — ABNORMAL LOW (ref 12.0–15.0)
Immature Granulocytes: 0 %
Lymphocytes Relative: 21 %
Lymphs Abs: 0.8 10*3/uL (ref 0.7–4.0)
MCH: 29.6 pg (ref 26.0–34.0)
MCHC: 30.9 g/dL (ref 30.0–36.0)
MCV: 95.6 fL (ref 80.0–100.0)
Monocytes Absolute: 0.4 10*3/uL (ref 0.1–1.0)
Monocytes Relative: 9 %
Neutro Abs: 2.7 10*3/uL (ref 1.7–7.7)
Neutrophils Relative %: 69 %
Platelets: 158 10*3/uL (ref 150–400)
RBC: 3.18 MIL/uL — ABNORMAL LOW (ref 3.87–5.11)
RDW: 15.8 % — ABNORMAL HIGH (ref 11.5–15.5)
WBC: 3.9 10*3/uL — ABNORMAL LOW (ref 4.0–10.5)
nRBC: 0 % (ref 0.0–0.2)

## 2019-08-04 LAB — LACTATE DEHYDROGENASE: LDH: 192 U/L (ref 98–192)

## 2019-08-04 LAB — FOLATE: Folate: 57.9 ng/mL (ref >5.9)

## 2019-08-04 LAB — IRON AND TIBC
Iron: 61 ug/dL (ref 28–170)
Saturation Ratios: 21 % (ref 10.4–31.8)
TIBC: 296 ug/dL (ref 250–450)
UIBC: 235 ug/dL

## 2019-08-04 LAB — VITAMIN B12: Vitamin B-12: 471 pg/mL (ref 180–914)

## 2019-08-04 LAB — FERRITIN: Ferritin: 461 ng/mL — ABNORMAL HIGH (ref 11–307)

## 2019-08-04 LAB — VITAMIN D 25 HYDROXY (VIT D DEFICIENCY, FRACTURES): Vit D, 25-Hydroxy: 44.57 ng/mL (ref 30–100)

## 2019-08-04 IMAGING — DX DG CHEST 2V
2 series · 2 of 2 positions shown · non-contrast
Comparison: Radiographs and CT 07/30/2018

CLINICAL DATA: Shortness of breath. Cough.

EXAM:
CHEST - 2 VIEW

[chest pa]
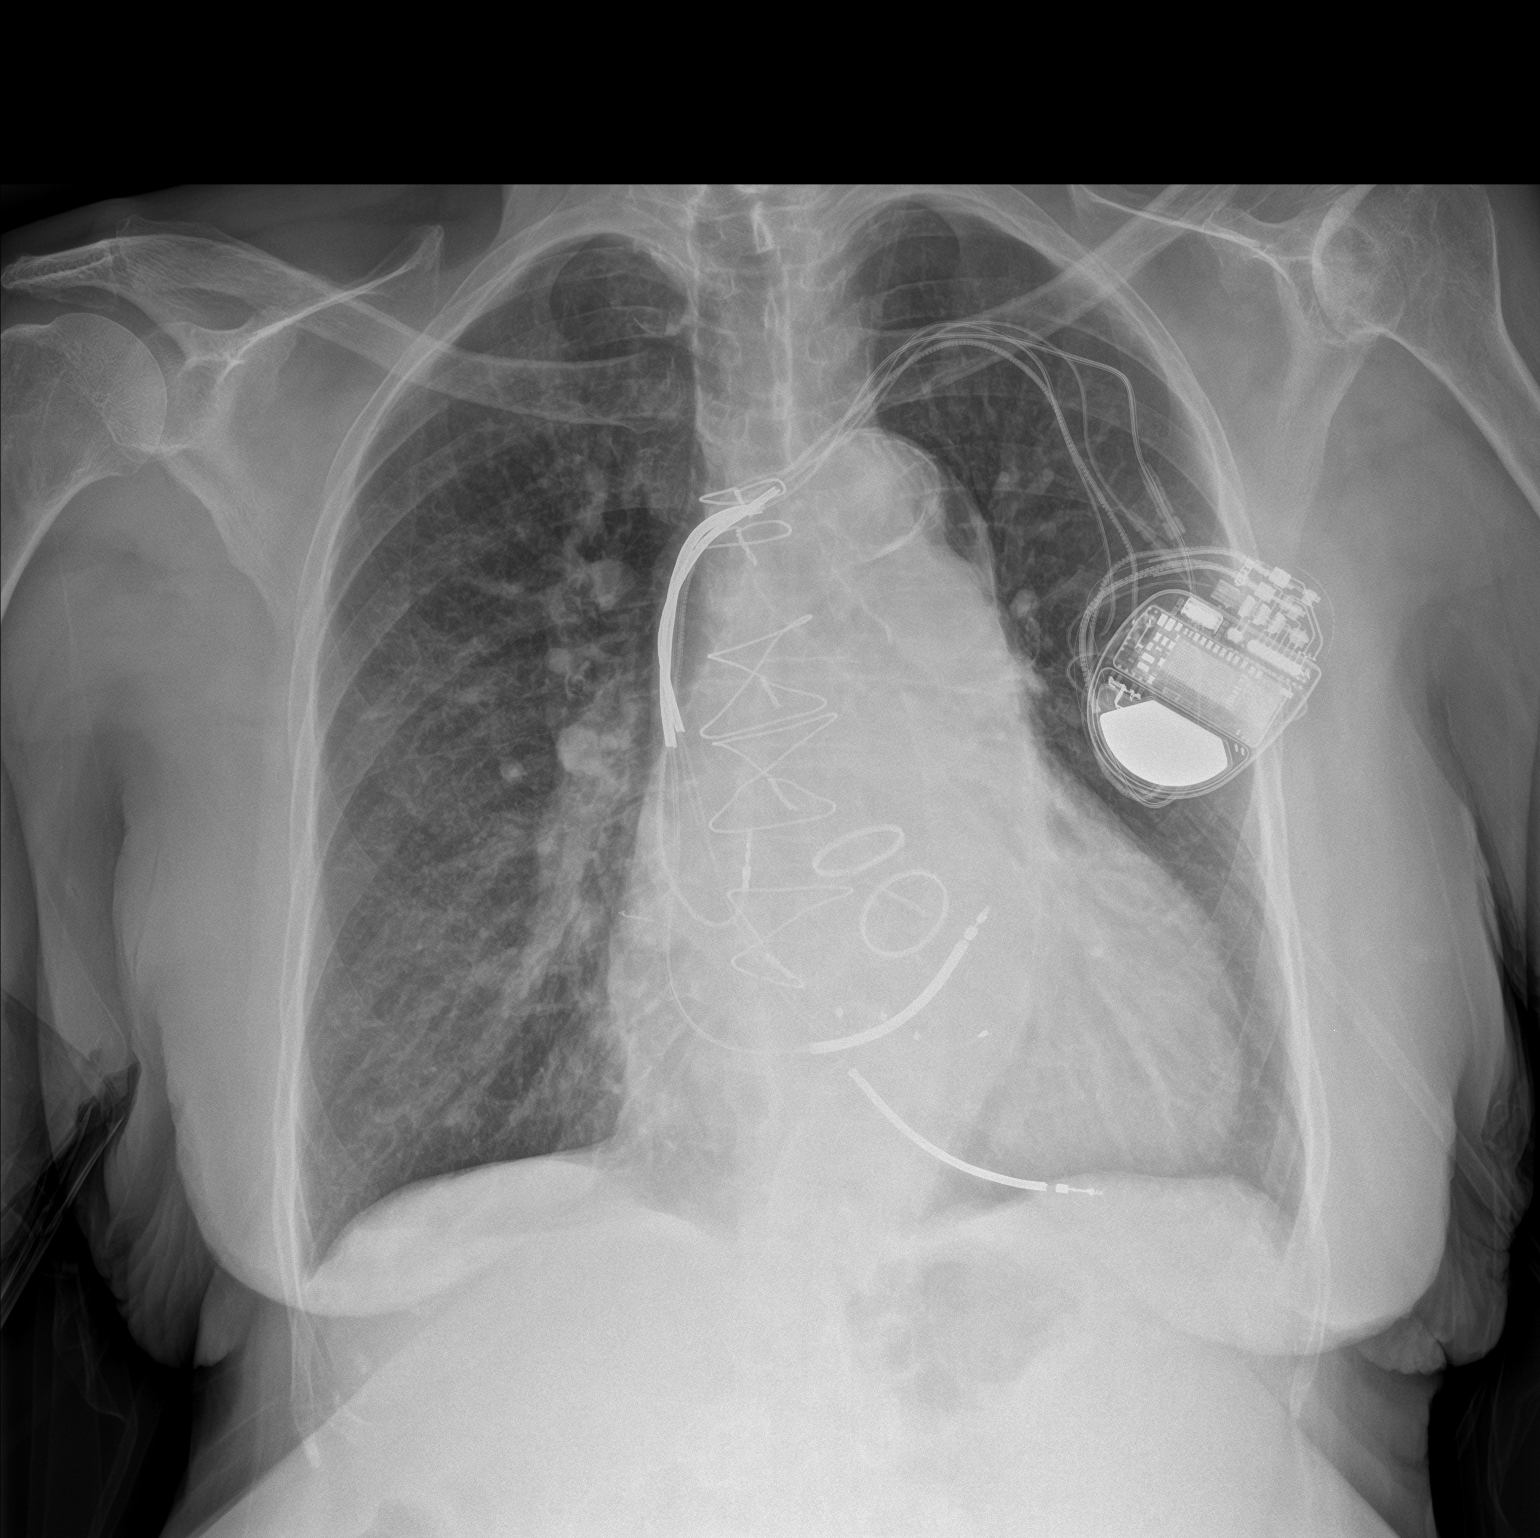

[chest lat]
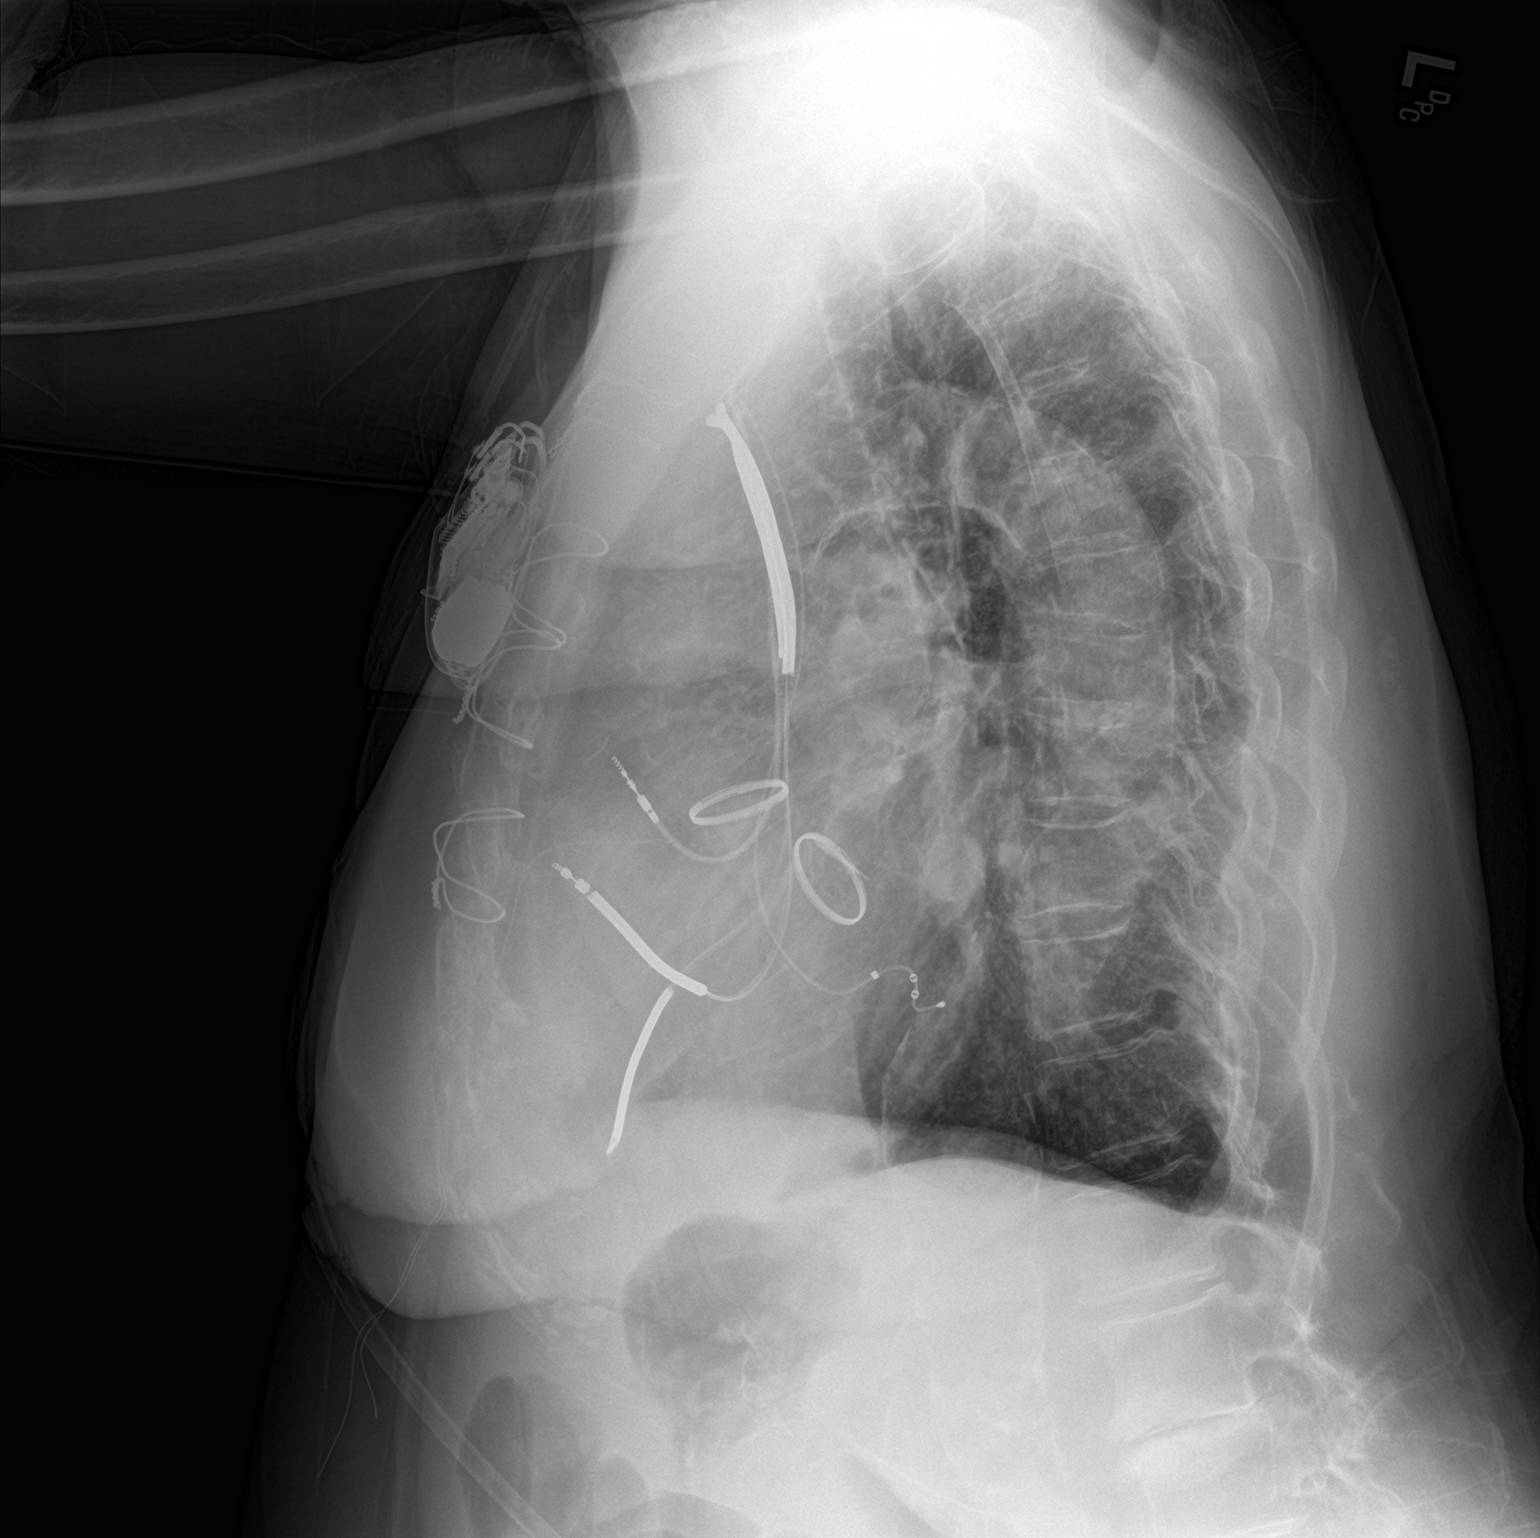

[2 of 2 positions shown; findings below may reference images not displayed]

FINDINGS: Post median sternotomy. Unchanged mild cardiomegaly. Unchanged
mediastinal contours with aortic atherosclerosis and tortuosity.
Prosthetic mitral and aortic valves. Left-sided pacemaker remains in
place. Improved reticulonodular opacities throughout both lungs from
prior exam. No new airspace disease, pleural effusion, pneumothorax
or pulmonary edema. Mild chronic wedging of midthoracic vertebra.
IMPRESSION: Improved reticulonodular opacities from exams 1 year prior. No new
abnormality.

Aortic Atherosclerosis ([id number].G).

## 2019-08-04 MED ORDER — EPOETIN ALFA-EPBX 40000 UNIT/ML IJ SOLN
40000.0000 [IU] | Freq: Once | INTRAMUSCULAR | Status: AC
  Administered 2019-08-04: 40000 [IU] via SUBCUTANEOUS
  Filled 2019-08-04: qty 1

## 2019-08-04 NOTE — Telephone Encounter (Signed)
Thank you.  I read the ED notes and agree with the plan.

## 2019-08-04 NOTE — Progress Notes (Signed)
She was in ED with "gas" pain. Can we please recheck in a week?

## 2019-08-04 NOTE — Assessment & Plan Note (Addendum)
1.  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 -She has been on Procrit that initially started 3 years ago, with overall stability in her hemoglobin. -She is now on Procrit injections 40,000 units every 4 weeks. -She is taking iron tablets twice daily and tolerating them well. -Labs on 08/03/2019 showed hemoglobin 9.4, platelets 158 -She will continue the Procrit injections every 4 weeks at this time. -She will follow-up in 2 months with repeat labs  2.  Chronic kidney disease: -Serum creatinine is stable at 1.44 -Continue to follow-up with nephrology.

## 2019-08-04 NOTE — Progress Notes (Signed)
Patient tolerated injection with no complaints voiced.  Site clean and dry with no bruising or swelling noted at site.  Band aid applied.  Vss with discharge and left ambulatory with no s/s of distress noted.  

## 2019-08-04 NOTE — Progress Notes (Signed)
[place] [place], [place] [address]   CLINIC:  Medical Oncology/Hematology  PCP:  Fayrene Helper, MD [address] [place] [address] [phone]   REASON FOR VISIT: Follow-up for normocytic anemia  CURRENT THERAPY: Procrit injections every 4 weeks    INTERVAL HISTORY:  [name] 85 y.o. female returns for routine follow-up for normocytic anemia.  Patient reports she has been doing well since her last visit she does have fatigue during the day.  However she is not very active.  She denies any bright red bleeding per rectum or melena.  Denies any easy bruising. Denies any nausea, vomiting, or diarrhea. Denies any new pains. Had not noticed any recent bleeding such as epistaxis, hematuria or hematochezia. Denies recent chest pain on exertion, shortness of breath on minimal exertion, pre-syncopal episodes, or palpitations. Denies any numbness or tingling in hands or feet. Denies any recent fevers, infections, or recent hospitalizations. Patient reports appetite at 75% and energy level at 25%.  She is eating well maintaining her weight at this time.     REVIEW OF SYSTEMS:  Review of Systems  Constitutional: Positive for fatigue.  Neurological: Positive for dizziness.  All other systems reviewed and are negative.    PAST MEDICAL/SURGICAL HISTORY:  Past Medical History:  Diagnosis Date  . Adenomatous polyp of colon 12/16/2002   Dr. [name]/[facility]  . Allergy   . Cataract   . CHB (complete heart block) ([place]) 10/07/2014      . CHF (congestive heart failure) ([place])    a.  EF previously reduced to 25-30% b. EF improved to 60-65% by echo in 10/2017.   Marland Kitchen Chronic kidney disease, stage I 2006  . Constipation       . COPD (chronic obstructive pulmonary disease) ([place])       . DJD (degenerative joint disease) of lumbar spine   . DM (diabetes mellitus) ([place]) 2006  . Esophageal reflux   . Glaucoma    . Gout       . H/O heart valve replacement with mechanical valve    a. s/p mechanical AVR in 2001 and mechanical MVR in 2004.  Marland Kitchen H/O wisdom tooth extraction   . Hyperlipemia   . IDA (iron deficiency anemia)    Parenteral iron/[name]  . Insomnia       . Non-ischemic cardiomyopathy ([place])    a. s/p MDT CRTD  . Obesity, unspecified   . Other and unspecified hyperlipidemia   . Oxygen deficiency 2014   nocturnal;  . Spondylolisthesis   . Spondylosis   . Thyroid cancer ([place])    remote thyroidectomy, no recurrence, pt denies in 2016 thyroid cancer  . Type 2 diabetes mellitus with hyperglycemia ([place]) 07/15/2010  . Unspecified hypothyroidism       . 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 Vertigo        Past Surgical History:  Procedure Laterality Date  . AORTIC AND MITRAL VALVE REPLACEMENT  2001  . BI-VENTRICULAR IMPLANTABLE CARDIOVERTER DEFIBRILLATOR UPGRADE N/A 01/18/2015   Procedure: BI-VENTRICULAR IMPLANTABLE CARDIOVERTER DEFIBRILLATOR UPGRADE;  Surgeon: Evans Lance, MD;  Location: [facility] CATH LAB;  Service: Cardiovascular;  Laterality: N/A;  . BIV ICD GENERATOR CHANGEOUT N/A 01/30/2019   Procedure: BIV ICD GENERATOR CHANGEOUT;  Surgeon: Evans Lance, MD;  Location: [facility] CV LAB;  Service: Cardiovascular;  Laterality: N/A;  . CARDIAC CATHETERIZATION    .  CARDIAC VALVE REPLACEMENT    . CARDIOVERSION N/A 11/13/2016   Procedure: CARDIOVERSION;  Surgeon: Sanda Klein, MD;  Location: [facility] ENDOSCOPY;  Service: Cardiovascular;  Laterality: N/A;  . COLONOSCOPY  06/12/2007   Rourk- Normal rectum/Normal colon  . COLONOSCOPY  12/16/02   small hemorrhoids  . COLONOSCOPY  06/20/2012   Procedure: COLONOSCOPY;  Surgeon: Daneil Dolin, MD;  Location: [facility] ENDO SUITE;  Service: Endoscopy;  Laterality: N/A;  10:45  . defibrillator implanted 2006    . DOPPLER ECHOCARDIOGRAPHY  2012  . DOPPLER ECHOCARDIOGRAPHY  05,06,07,08,09,2011  . ESOPHAGOGASTRODUODENOSCOPY   06/12/2007   Rourk- normal esophagus, small hiatal hernia, otherwise normal stomach, D1, D2  . EYE SURGERY Right 2005  . EYE SURGERY Left 2006  . ICD LEAD REMOVAL Left 02/08/2015   Procedure: ICD LEAD REMOVAL;  Surgeon: Evans Lance, MD;  Location: [facility] OR;  Service: Cardiovascular;  Laterality: Left;  "Will plan extraction and insertion of a BiV PM"  **Dr. Roxan Hockey backing up case**  . IMPLANTABLE CARDIOVERTER DEFIBRILLATOR (ICD) GENERATOR CHANGE Left 02/08/2015   Procedure: ICD GENERATOR CHANGE;  Surgeon: Evans Lance, MD;  Location: [place];  Service: Cardiovascular;  Laterality: Left;  . INSERT / REPLACE / REMOVE PACEMAKER    . PACEMAKER INSERTION  May 2016  . pacemaker placed  2004  . right breast cyst removed     benign   . right cataract removed     2005  . TEE WITHOUT CARDIOVERSION N/A 11/13/2016   Procedure: TRANSESOPHAGEAL ECHOCARDIOGRAM (TEE);  Surgeon: Sanda Klein, MD;  Location: [facility] ENDOSCOPY;  Service: Cardiovascular;  Laterality: N/A;  . THYROIDECTOMY       SOCIAL HISTORY:  Social History   Socioeconomic History  . Marital status: Widowed    Spouse name: Not on file  . Number of children: 0  . Years of education: Not on file  . Highest education level: Not on file  Occupational History  . Occupation: retired    Fish farm manager: RETIRED  Social Needs  . Financial resource strain: Somewhat hard  . Food insecurity    Worry: Never true    Inability: Never true  . Transportation needs    Medical: No    Non-medical: No  Tobacco Use  . Smoking status: Former Smoker    Packs/day: 0.75    Years: 40.00    Pack years: 30.00    Types: Cigarettes    Quit date: 03/08/1987    Years since quitting: 32.4  . Smokeless tobacco: Never Used  Substance and Sexual Activity  . Alcohol use: No    Alcohol/week: 0.0 standard drinks  . Drug use: No  . Sexual activity: Not Currently  Lifestyle  . Physical activity    Days per week: 2 days    Minutes per session: 20 min  .  Stress: Not at all  Relationships  . Social connections    Talks on phone: More than three times a week    Gets together: Never    Attends religious service: More than 4 times per year    Active member of club or organization: No    Attends meetings of clubs or organizations: Never    Relationship status: Widowed  . Intimate partner violence    Fear of current or ex partner: No    Emotionally abused: No    Physically abused: No    Forced sexual activity: No  Other Topics Concern  . Not on file  Social History Narrative   From  [place] (2004)    FAMILY HISTORY:  Family History  Problem Relation Age of Onset  . Pancreatic cancer Mother   . Heart disease Father   . Heart disease Sister   . Heart attack Sister   . Heart disease Brother   . Diabetes Brother   . Heart disease Brother   . Diabetes Brother   . Hypertension Brother   . Lung cancer Brother     CURRENT MEDICATIONS:  Outpatient Encounter Medications as of 08/04/2019  Medication Sig  . acetaminophen (TYLENOL) 500 MG tablet Take 500 mg by mouth every 6 (six) hours as needed (pain).  Marland Kitchen albuterol (PROVENTIL) (2.5 MG/3ML) 0.083% nebulizer solution INHALE 1 VIAL VIA NEBULIZER THREE TIMES DAILY. (Patient taking differently: Take 2.5 mg by nebulization every 6 (six) hours as needed for wheezing or shortness of breath. )  . albuterol (VENTOLIN HFA) 108 (90 Base) MCG/ACT inhaler INHALE 2 PUFFS INTO THE LUNGS EVERY 6 HOURS AS NEEDED FOR WHEEZING OR SHORTNESS OF BREATH (Patient taking differently: Inhale 2 puffs into the lungs every 6 (six) hours as needed for wheezing. )  . allopurinol (ZYLOPRIM) 100 MG tablet TAKE 1 TABLET(100 MG) BY MOUTH DAILY (Patient taking differently: Take 100 mg by mouth daily. )  . BREO ELLIPTA 100-25 MCG/INH AEPB 1 TAKE 1 INHALATION BY MOUTH ONCE DAILY (Patient taking differently: Inhale 1 puff into the lungs daily as needed (for shortness of breath). )  . cholecalciferol (VITAMIN D3) 25 MCG (1000 UT)  tablet Take 1,000 Units by mouth daily.  Marland Kitchen docusate sodium (COLACE) 100 MG capsule Take 300 mg by mouth at bedtime.   . fluticasone (FLONASE) 50 MCG/ACT nasal spray SHAKE LIQUID AND USE 2 SPRAYS IN EACH NOSTRIL DAILY (Patient taking differently: Place 2 sprays into both nostrils daily. )  . folic acid (FOLVITE) 1 MG tablet Take 1 tablet (1 mg total) by mouth daily.  . furosemide (LASIX) 40 MG tablet Take 1 tablet (40 mg total) by mouth daily.  Marland Kitchen gabapentin (NEURONTIN) 300 MG capsule Take 1 capsule (300 mg total) by mouth 2 (two) times daily.  . hydrALAZINE (APRESOLINE) 25 MG tablet Take 1 tablet (25 mg total) by mouth 2 (two) times daily.  Marland Kitchen HYDROcodone-acetaminophen (NORCO) 7.5-325 MG tablet Take 1 tablet by mouth every 6 (six) hours as needed for moderate pain (Must last 30 days.).  Marland Kitchen isosorbide mononitrate (IMDUR) 30 MG 24 hr tablet Take 1 tablet (30 mg total) by mouth daily.  . Liniments (SALONPAS PAIN RELIEF PATCH EX) Apply 1 patch topically daily as needed (pain).  . meclizine (ANTIVERT) 12.5 MG tablet Take 1 tablet (12.5 mg total) by mouth 3 (three) times daily as needed for dizziness.  . metolazone (ZAROXOLYN) 2.5 MG tablet Take 1 tablet (2.5 mg total) by mouth 2 (two) times a week. Wednesdays and Saturdays Skip dose on 1/4 and start from 1/8 (Patient taking differently: Take 2.5 mg by mouth 2 (two) times a week. Wednesdays and Saturdays)  . metoprolol succinate (TOPROL-XL) 100 MG 24 hr tablet TAKE 1 TABLET BY MOUTH DAILY. TAKE WITH OR IMMEDIATELY FOLLOWING A MEAL. (Patient taking differently: Take 100 mg by mouth daily. TAKE WITH OR IMMEDIATELY FOLLOWING A MEAL.)  . Multiple Vitamin (MULTIVITAMIN) LIQD Take 5 mLs by mouth daily.  . nitroGLYCERIN (NITROSTAT) 0.4 MG SL tablet Place 1 tablet (0.4 mg total) under the tongue every 5 (five) minutes as needed for chest pain.  Marland Kitchen ondansetron (ZOFRAN) 4 MG tablet Take 1 tablet (4 mg total) by  mouth daily as needed for nausea or vomiting.  .  pantoprazole (PROTONIX) 40 MG tablet Take 1 tablet (40 mg total) by mouth daily.  [name] [name] (SYSTANE) 0.4-0.3 % GEL ophthalmic gel Place 1 application into the left eye at bedtime.  . potassium chloride SA (KLOR-CON) 20 MEQ tablet Take 1 tablet (20 mEq total) by mouth daily.  . pravastatin (PRAVACHOL) 40 MG tablet Take 1 tablet (40 mg total) by mouth every evening.  Marland Kitchen SYNTHROID 75 MCG tablet TAKE 1 TABLET BY MOUTH DAILY (Patient taking differently: Take 75 mcg by mouth daily before breakfast. )  . warfarin (JANTOVEN) 5 MG tablet Take as directed by coumadin clinic (Patient taking differently: Take 5-10 mg by mouth See admin instructions. Take 5mg  on Tuesdays and Saturdays. Takes 10mg  on all other days)   Facility-Administered Encounter Medications as of 08/04/2019  Medication  . epoetin alfa (EPOGEN,PROCRIT) injection 40,000 Units    ALLERGIES:  Allergies  Allergen Reactions  . Penicillins Other (See Comments)    Bruise Did it involve swelling of the face/tongue/throat, SOB, or low BP? No Did it involve sudden or severe rash/hives, skin peeling, or any reaction on the inside of your mouth or nose? No Did you need to seek medical attention at a hospital or doctor's office? No When did it last happen?20+ years If all above answers are "NO", may proceed with cephalosporin use.   . Aspirin Other (See Comments)    On coumadin  . Fentanyl Dermatitis    Rash with patch   . Niacin Itching     PHYSICAL EXAM:  ECOG Performance status: 1  Vitals:   08/04/19 1115  BP: (!) 131/51  Pulse: (!) 59  Resp: 20  Temp: 97.9 F (36.6 C)  SpO2: 100%   Filed Weights   08/04/19 1115  Weight: 182 lb 11.2 oz (82.9 kg)    Physical Exam Constitutional:      Appearance: Normal appearance. She is normal weight.  Cardiovascular:     Rate and Rhythm: Normal rate and regular rhythm.     Heart sounds: Normal heart sounds.  Pulmonary:     Effort: Pulmonary effort is  normal.     Breath sounds: Normal breath sounds.  Abdominal:     General: Bowel sounds are normal.     Palpations: Abdomen is soft.  Musculoskeletal: Normal range of motion.  Skin:    General: Skin is warm.  Neurological:     Mental Status: She is alert and oriented to person, place, and time. Mental status is at baseline.  Psychiatric:        Mood and Affect: Mood normal.        Behavior: Behavior normal.        Thought Content: Thought content normal.        Judgment: Judgment normal.      LABORATORY DATA:  I have reviewed the labs as listed.  CBC    Component Value Date/Time   WBC 3.9 (L) 08/04/2019 1024   RBC 3.18 (L) 08/04/2019 1024   HGB 9.4 (L) 08/04/2019 1024   HGB 9.6 (L) 01/28/2019 1218   HCT 30.4 (L) 08/04/2019 1024   HCT 29.9 (L) 01/28/2019 1218   PLT 158 08/04/2019 1024   PLT 199 01/28/2019 1218   MCV 95.6 08/04/2019 1024   MCV 90 01/28/2019 1218   MCH 29.6 08/04/2019 1024   MCHC 30.9 08/04/2019 1024   RDW 15.8 (H) 08/04/2019 1024   RDW 17.1 (H) 01/28/2019  1218   LYMPHSABS 0.8 08/04/2019 1024   LYMPHSABS 0.7 01/28/2019 1218   MONOABS 0.4 08/04/2019 1024   EOSABS 0.0 08/04/2019 1024   EOSABS 0.0 01/28/2019 1218   BASOSABS 0.0 08/04/2019 1024   BASOSABS 0.0 01/28/2019 1218   CMP Latest Ref Rng & Units 08/04/2019 08/02/2019 07/07/2019  Glucose 70 - 99 mg/dL 99 97 81  BUN 8 - 23 mg/dL 38(H) 43(H) 54(H)  Creatinine 0.44 - 1.00 mg/dL 1.45(H) 1.44(H) 1.82(H)  Sodium 135 - 145 mmol/L 135 134(L) 132(L)  Potassium 3.5 - 5.1 mmol/L 3.9 4.3 4.4  Chloride 98 - 111 mmol/L 97(L) 96(L) 93(L)  CO2 22 - 32 mmol/L 28 29 29   Calcium 8.9 - 10.3 mg/dL 9.4 9.6 9.5  Total Protein 6.5 - 8.1 g/dL 7.9 - 7.7  Total Bilirubin 0.3 - 1.2 mg/dL 0.9 - 0.6  Alkaline Phos 38 - 126 U/L 52 - 48  AST 15 - 41 U/L 25 - 24  ALT 0 - 44 U/L 11 - 12   I personally performed a face-to-face visit.  All questions were answered to patient's stated satisfaction. Encouraged patient to call  with any new concerns or questions before his next visit to the cancer center and we can certain see him sooner, if needed.     ASSESSMENT & PLAN:   Iron deficiency 1.  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 -She has been on Procrit that initially started 3 years ago, with overall stability in her hemoglobin. -She is now on Procrit injections 40,000 units every 4 weeks. -She is taking iron tablets twice daily and tolerating them well. -Labs on 08/03/2019 showed hemoglobin 9.4, platelets 158 -She will continue the Procrit injections every 4 weeks at this time. -She will follow-up in 2 months with repeat labs  2.  Chronic kidney disease: -Serum creatinine is stable at 1.44 -Continue to follow-up with nephrology.        Orders placed this encounter:  Orders Placed This Encounter  Procedures  . Lactate dehydrogenase  . CBC with Differential/Platelet  . Comprehensive metabolic panel  . Ferritin  . Iron and TIBC  . Vitamin B12  . Vitamin D 25 hydroxy  . Folate      Francene Finders, FNP-C [place] [phone]

## 2019-08-04 NOTE — Progress Notes (Signed)
EPIC Encounter for ICM Monitoring  Patient Name: [name] [name] is a 85 y.o. female Date: 08/04/2019 Primary Care Physican: Fayrene Helper, MD Primary Cardiologist:Croitoru Electrophysiologist:Taylor Bi-V Pacing:99.9%(effective 77.3%) 11/9/2020Weight:182lbs   Attempted call to patient and unable to reach.   Transmission reviewed.   Optivol thoracic impedancetrending below baseline normal suggesting possible fluid accumulation.  High LV threshold addressed 07/03/2019 at device clinic visit.  Prescribed:Furosemide40 mg 1 tablet daily. Metolazaone 2.5 mg Take 1tablet twicea week (Wednesday and Saturdays). Potassium 20 mEq take 1 tablet daily.  Labs: 08/04/2019 Creatinine 1.45, BUN 38, Potassium 3.9, Sodium 135, GFR 33-38 08/02/2019 Creatinine 1.44, BUN 43, Potassium 4.3, Sodium 134, GFR 33-38 07/07/2019 Creatinine 1.82, BUN 54, Potassium 4.4, Sodium 132, GFR 25-29 05/31/2019 Creatinine 1.48, BUN 45, Potassium 4.4, Sodium 132, GFR 32-37 05/30/2019 Creatinine 1.54, BUN 47, Potassium 4.2, Sodium 131, GFR 30-35  05/29/2019 Creatinine 1.86, BUN 50, Potassium 4.6, Sodium 129, GFR 24-28  A complete set of results can be found in Results Review.  Recommendations: Unable to reach.    Follow-up plan: ICM clinic phone appointment on 08/11/2019 to recheck fluid levels.   91 day device clinic remote transmission 08/15/2019.    Copy of ICM check sent to Dr. Sallyanne Kuster and Dr Lovena Le.   3 month ICM trend: 08/04/2019    1 Year ICM trend:       [name], RN 08/04/2019 2:25 PM

## 2019-08-04 NOTE — Telephone Encounter (Signed)
Remote ICM transmission received.  Attempted call to patient regarding ICM remote transmission and no answer or voice mail box. 

## 2019-08-04 NOTE — Telephone Encounter (Signed)
Patient went to the ER on Saturday, she had pain in her left breast. She believes it was trapped gas, states she took a tum's before going and that did help. The doctor at the ER did advise her to let her cardiologist know. She was prescribed nitroGLYCERIN 0.4mg .

## 2019-08-04 NOTE — Patient Instructions (Addendum)
[place] at [facility]  Discharge Instructions: Follow up in 8 weeks for an office visit. Continue every 4 week injections and labs.   You saw Francene Finders, NP, today. _______________________________________________________________  Thank you for choosing [place] at [facility] to provide your oncology and hematology care.  To afford each patient quality time with our providers, please arrive at least 15 minutes before your scheduled appointment.  You need to re-schedule your appointment if you arrive 10 or more minutes late.  We strive to give you quality time with our providers, and arriving late affects you and other patients whose appointments are after yours.  Also, if you no show three or more times for appointments you may be dismissed from the clinic.  Again, thank you for choosing [place] at [place] hope is that these requests will allow you access to exceptional care and in a timely manner. _______________________________________________________________  If you have questions after your visit, please contact our office at [phone] between the hours of 8:30 a.m. and 5:00 p.m. Voicemails left after 4:30 p.m. will not be returned until the following business day. _______________________________________________________________  For prescription refill requests, have your pharmacy contact our office. _______________________________________________________________  Recommendations made by the consultant and any test results will be sent to your referring physician. _______________________________________________________________

## 2019-08-04 NOTE — Telephone Encounter (Signed)
Per pt feels the episode that pt had over the weekend was "gas" Pt had taken a tums and pain resolved Per pt did receive upon ed discharge script for ntg Instructed pt to monitor and if notes having to use the NTG call us back and if needs to go to ED for eval and evaluation Pt agrees and will call back if needed ./cy

## 2019-08-07 DIAGNOSIS — R809 Proteinuria, unspecified: Secondary | ICD-10-CM | POA: Diagnosis not present

## 2019-08-07 DIAGNOSIS — E1129 Type 2 diabetes mellitus with other diabetic kidney complication: Secondary | ICD-10-CM | POA: Diagnosis not present

## 2019-08-07 DIAGNOSIS — D631 Anemia in chronic kidney disease: Secondary | ICD-10-CM | POA: Diagnosis not present

## 2019-08-07 DIAGNOSIS — N1832 Chronic kidney disease, stage 3b: Secondary | ICD-10-CM | POA: Diagnosis not present

## 2019-08-07 DIAGNOSIS — E211 Secondary hyperparathyroidism, not elsewhere classified: Secondary | ICD-10-CM | POA: Diagnosis not present

## 2019-08-07 DIAGNOSIS — E559 Vitamin D deficiency, unspecified: Secondary | ICD-10-CM | POA: Diagnosis not present

## 2019-08-07 DIAGNOSIS — I129 Hypertensive chronic kidney disease with stage 1 through stage 4 chronic kidney disease, or unspecified chronic kidney disease: Secondary | ICD-10-CM | POA: Diagnosis not present

## 2019-08-07 DIAGNOSIS — I5032 Chronic diastolic (congestive) heart failure: Secondary | ICD-10-CM | POA: Diagnosis not present

## 2019-08-07 DIAGNOSIS — N189 Chronic kidney disease, unspecified: Secondary | ICD-10-CM | POA: Diagnosis not present

## 2019-08-11 ENCOUNTER — Ambulatory Visit (INDEPENDENT_AMBULATORY_CARE_PROVIDER_SITE_OTHER): Payer: Medicare Other

## 2019-08-11 DIAGNOSIS — I5032 Chronic diastolic (congestive) heart failure: Secondary | ICD-10-CM

## 2019-08-11 DIAGNOSIS — Z95810 Presence of automatic (implantable) cardiac defibrillator: Secondary | ICD-10-CM

## 2019-08-11 NOTE — Progress Notes (Signed)
Thank you, that is better!

## 2019-08-11 NOTE — Progress Notes (Signed)
EPIC Encounter for ICM Monitoring  Patient Name: [name] [name] is a 85 y.o. female Date: 08/11/2019 Primary Care Physican: Fayrene Helper, MD Primary Cardiologist:Croitoru Electrophysiologist:Taylor Bi-V Pacing:99.9% 11/9/2020Weight:182lbs   Spoke with patient and she is asymptomatic.  She is feeling fine.   Optivol thoracic impedancereturned to normal since 08/04/2019 remote transmission.  Prescribed:Furosemide40 mg 1 tablet daily. Metolazaone 2.5 mg Take 1tablet twicea week (Wednesday and Saturdays). Potassium 20 mEq take 1 tablet daily.  Labs: 08/04/2019 Creatinine 1.45, BUN 38, Potassium 3.9, Sodium 135, GFR 33-38 08/02/2019 Creatinine 1.44, BUN 43, Potassium 4.3, Sodium 134, GFR 33-38 07/07/2019 Creatinine 1.82, BUN 54, Potassium 4.4, Sodium 132, GFR 25-29 05/31/2019 Creatinine1.48, BUN45, Potassium4.4, Sodium132, GFR32-37 05/30/2019 Creatinine1.54, BUN47, Potassium4.2, Sodium131, GFR30-35  05/29/2019 Creatinine1.86, BUN50, Potassium4.6, Sodium129, NOT77-11 A complete set of results can be found in Results Review.  Recommendations: No changes and encouraged to call if experiencing any fluid symptoms.  Follow-up plan: ICM clinic phone appointment on 09/08/2019.  91 day device clinic remote transmission 08/15/2019.     Copy of ICM check sent to Dr. Lovena Le and Dr Sallyanne Kuster.   3 month ICM trend: 08/11/2019    1 Year ICM trend:       [name], RN 08/11/2019 3:21 PM

## 2019-08-14 ENCOUNTER — Ambulatory Visit (INDEPENDENT_AMBULATORY_CARE_PROVIDER_SITE_OTHER): Payer: Medicare Other | Admitting: *Deleted

## 2019-08-14 ENCOUNTER — Other Ambulatory Visit: Payer: Self-pay

## 2019-08-14 DIAGNOSIS — Z952 Presence of prosthetic heart valve: Secondary | ICD-10-CM

## 2019-08-14 DIAGNOSIS — Z5181 Encounter for therapeutic drug level monitoring: Secondary | ICD-10-CM

## 2019-08-14 LAB — POCT INR: INR: 1.7 — AB (ref 2.0–3.0)

## 2019-08-14 NOTE — Patient Instructions (Signed)
Take coumadin 3 tablets tonight then increase dose to 2 tablets daily except 1 tablet on Tuesdays  Continue greens Recheck in 3 weeks

## 2019-08-15 ENCOUNTER — Ambulatory Visit (INDEPENDENT_AMBULATORY_CARE_PROVIDER_SITE_OTHER): Payer: Medicare Other | Admitting: *Deleted

## 2019-08-15 DIAGNOSIS — I442 Atrioventricular block, complete: Secondary | ICD-10-CM | POA: Diagnosis not present

## 2019-08-15 LAB — CUP PACEART REMOTE DEVICE CHECK
Battery Remaining Longevity: 63 mo
Battery Voltage: 3 V
Brady Statistic AP VP Percent: 0 %
Brady Statistic AP VS Percent: 0 %
Brady Statistic AS VP Percent: 99.86 %
Brady Statistic AS VS Percent: 0.14 %
Brady Statistic RA Percent Paced: 0 %
Brady Statistic RV Percent Paced: 99.86 %
Date Time Interrogation Session: 20201120002106
Implantable Lead Implant Date: 20040521
Implantable Lead Implant Date: 20071218
Implantable Lead Implant Date: 20160516
Implantable Lead Location: 753858
Implantable Lead Location: 753859
Implantable Lead Location: 753860
Implantable Lead Model: 5076
Implantable Lead Model: 6947
Implantable Pulse Generator Implant Date: 20200506
Lead Channel Impedance Value: 285 Ohm
Lead Channel Impedance Value: 285 Ohm
Lead Channel Impedance Value: 304 Ohm
Lead Channel Impedance Value: 323 Ohm
Lead Channel Impedance Value: 361 Ohm
Lead Channel Impedance Value: 399 Ohm
Lead Channel Impedance Value: 418 Ohm
Lead Channel Impedance Value: 418 Ohm
Lead Channel Impedance Value: 589 Ohm
Lead Channel Impedance Value: 627 Ohm
Lead Channel Impedance Value: 627 Ohm
Lead Channel Impedance Value: 741 Ohm
Lead Channel Impedance Value: 741 Ohm
Lead Channel Impedance Value: 760 Ohm
Lead Channel Pacing Threshold Amplitude: 1.625 V
Lead Channel Pacing Threshold Amplitude: 2.375 V
Lead Channel Pacing Threshold Pulse Width: 0.4 ms
Lead Channel Pacing Threshold Pulse Width: 1 ms
Lead Channel Setting Pacing Amplitude: 3 V
Lead Channel Setting Pacing Amplitude: 3.25 V
Lead Channel Setting Pacing Pulse Width: 0.8 ms
Lead Channel Setting Pacing Pulse Width: 1 ms
Lead Channel Setting Sensing Sensitivity: 5.6 mV

## 2019-08-18 ENCOUNTER — Other Ambulatory Visit: Payer: Self-pay

## 2019-08-18 ENCOUNTER — Telehealth: Payer: Self-pay

## 2019-08-18 MED ORDER — ONDANSETRON HCL 4 MG PO TABS: 4.0000 mg | ORAL_TABLET | Freq: Every day | ORAL | 1 refills | Status: DC | PRN

## 2019-08-18 MED ORDER — FUROSEMIDE 40 MG PO TABS: 40.0000 mg | ORAL_TABLET | Freq: Every day | ORAL | 1 refills | Status: DC

## 2019-08-18 NOTE — Telephone Encounter (Signed)
Sent to [facility].

## 2019-08-18 NOTE — Telephone Encounter (Signed)
Pt needs Furosemide & Zofran called in --90 days supply

## 2019-08-19 ENCOUNTER — Encounter: Payer: Self-pay | Admitting: Orthopaedic Surgery

## 2019-08-19 ENCOUNTER — Ambulatory Visit (INDEPENDENT_AMBULATORY_CARE_PROVIDER_SITE_OTHER): Payer: Medicare Other | Admitting: Orthopaedic Surgery

## 2019-08-19 ENCOUNTER — Other Ambulatory Visit: Payer: Self-pay

## 2019-08-19 VITALS — BP 179/91 | HR 80 | Temp 97.1°F | Ht 67.0 in | Wt 187.0 lb

## 2019-08-19 DIAGNOSIS — M25512 Pain in left shoulder: Secondary | ICD-10-CM

## 2019-08-19 DIAGNOSIS — G8929 Other chronic pain: Secondary | ICD-10-CM | POA: Diagnosis not present

## 2019-08-19 MED ORDER — HYDROCODONE-ACETAMINOPHEN 7.5-325 MG PO TABS: 1.0000 | ORAL_TABLET | Freq: Four times a day (QID) | ORAL | 0 refills | Status: DC | PRN

## 2019-08-19 MED ORDER — ONDANSETRON HCL 4 MG PO TABS: 4.0000 mg | ORAL_TABLET | Freq: Every day | ORAL | 1 refills | Status: DC | PRN

## 2019-08-19 MED ORDER — FUROSEMIDE 40 MG PO TABS: 40.0000 mg | ORAL_TABLET | Freq: Every day | ORAL | 1 refills | Status: DC

## 2019-08-19 NOTE — Telephone Encounter (Signed)
Refills sent to [facility] [address]

## 2019-08-19 NOTE — Telephone Encounter (Signed)
Please send prescriptions to [facility] [address]---please send all medication to [facility] not [facility]

## 2019-08-19 NOTE — Addendum Note (Signed)
Addended by: Willette Pa on: 08/19/2019 02:24 PM   Modules accepted: Orders

## 2019-08-19 NOTE — Progress Notes (Addendum)
PROCEDURE NOTE:  The patient request injection, verbal consent was obtained.  The left shoulder was prepped appropriately after time out was performed.   Sterile technique was observed and injection of 1 cc of Depo-Medrol 40 mg with several cc's of plain xylocaine. Anesthesia was provided by ethyl chloride and a 20-gauge needle was used to inject the shoulder area. A posterior approach was used.  The injection was tolerated well.  A band aid dressing was applied.  The patient was advised to apply ice later today and tomorrow to the injection sight as needed.  I will see as needed.  Electronically [place], MD 11/24/20202:16 PM   I have reviewed the [place] web site prior to prescribing narcotic medicine for this patient.

## 2019-08-22 IMAGING — DX DG CHEST 2V
2 series · 2 of 2 positions shown · non-contrast
Comparison: 09/26/2018

CLINICAL DATA: Cough for several days

EXAM:
CHEST - 2 VIEW

[chest lat]
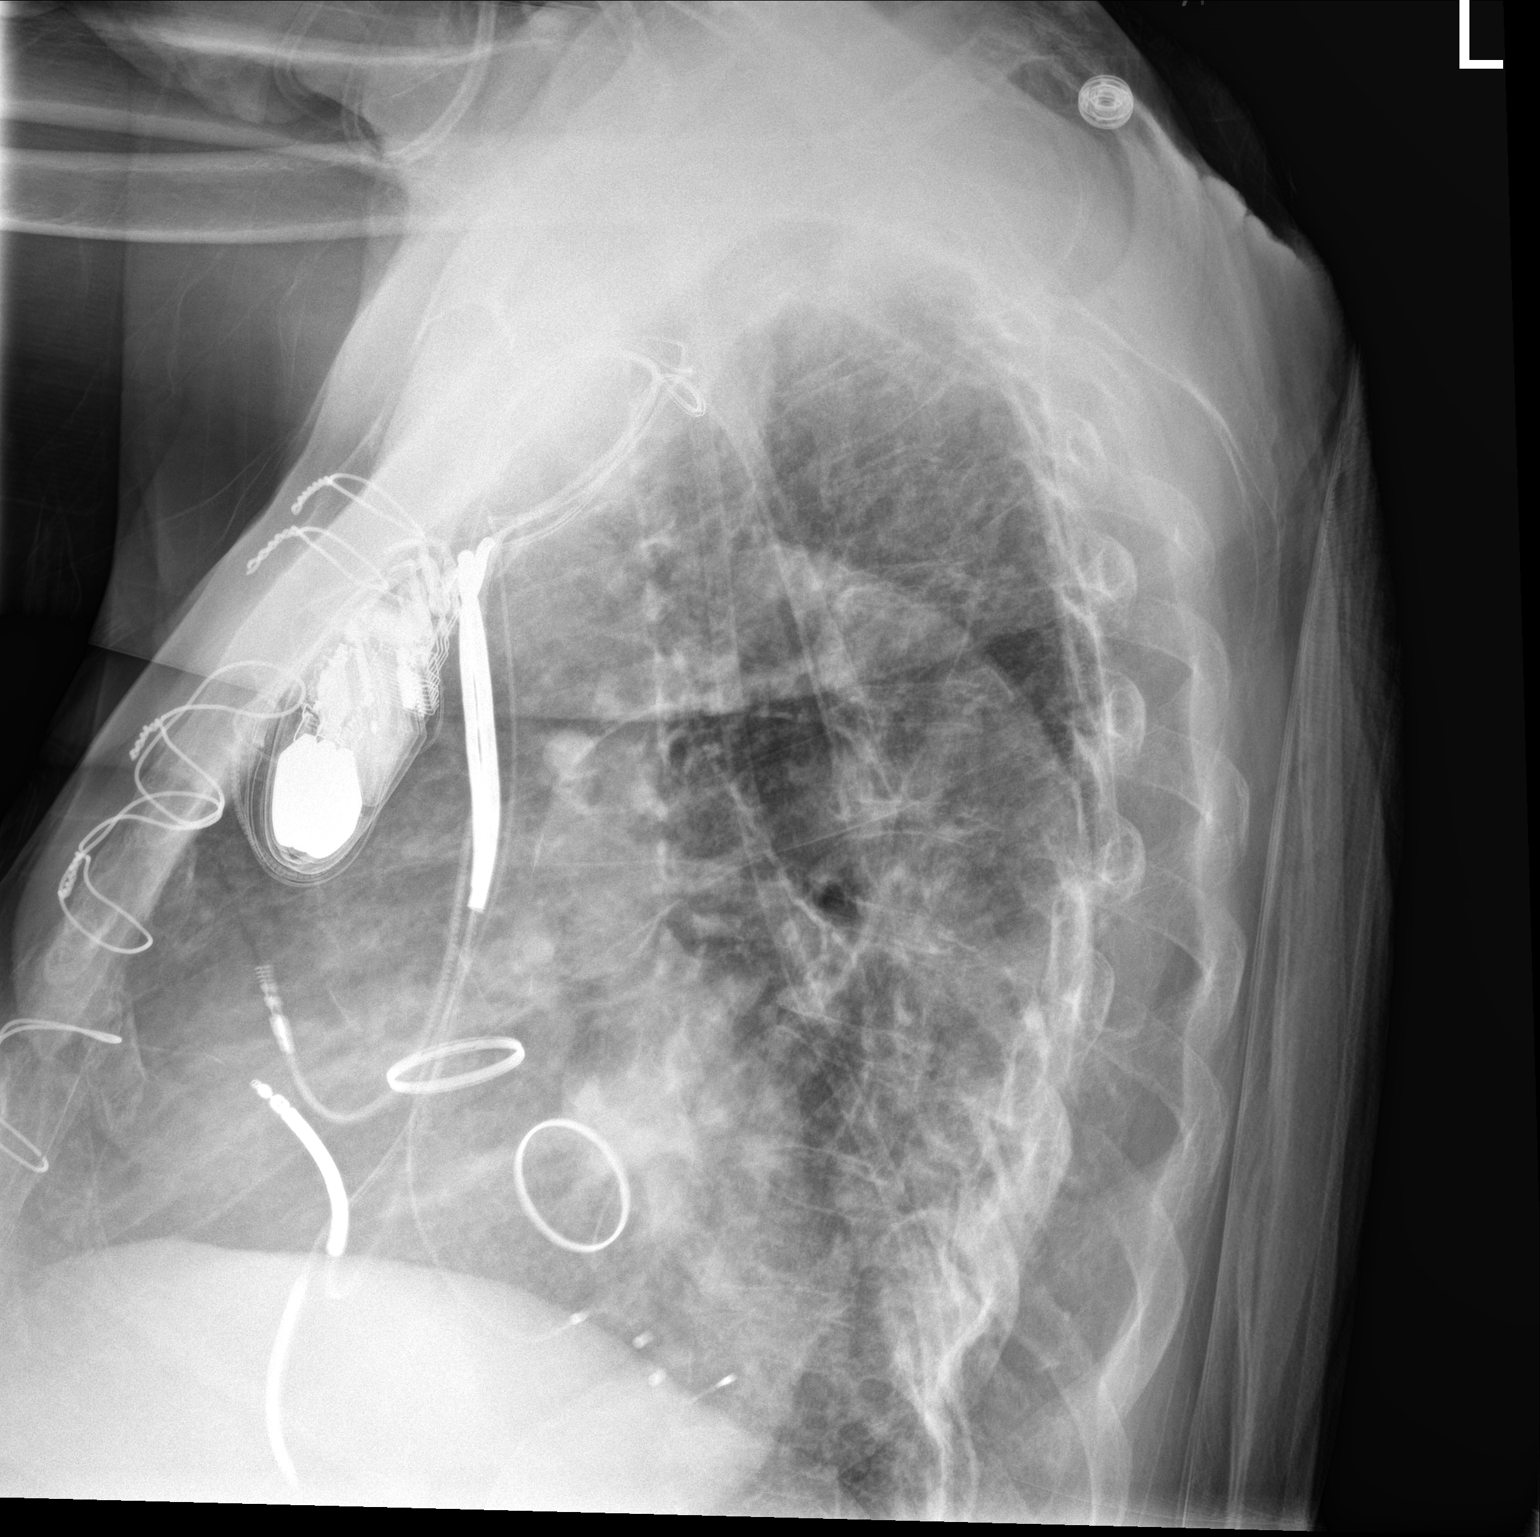

[chest ap]
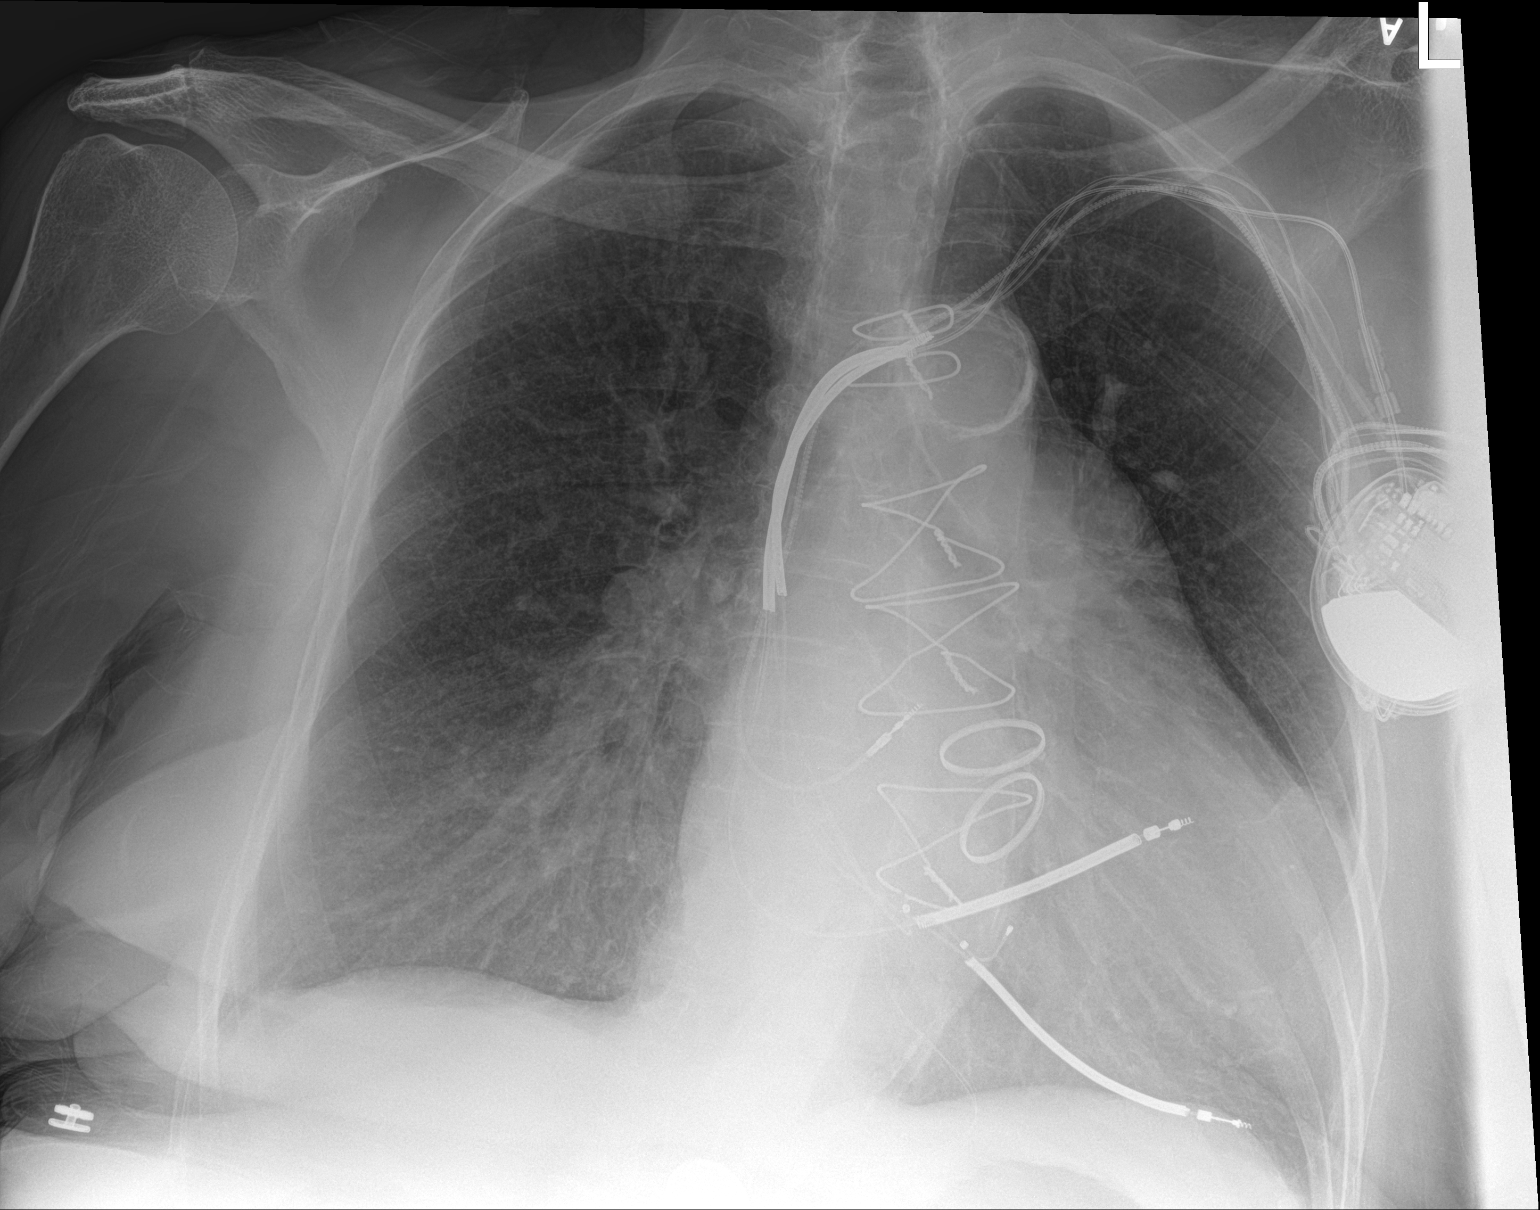

[2 of 2 positions shown; findings below may reference images not displayed]

FINDINGS: Cardiac shadow is enlarged. Postsurgical changes are noted.
Defibrillator is again noted and stable. Aortic calcifications are
seen. The lungs are hyperinflated consistent with COPD. No focal
infiltrate or sizable effusion is noted. No acute bony abnormality
is seen.
IMPRESSION: No acute abnormality identified.

Aortic Atherosclerosis (LFXEZ-00C.C) and Emphysema (LFXEZ-KHI.F).

## 2019-08-25 ENCOUNTER — Telehealth: Payer: Self-pay

## 2019-08-25 ENCOUNTER — Other Ambulatory Visit: Payer: Self-pay

## 2019-08-25 MED ORDER — ALBUTEROL SULFATE HFA 108 (90 BASE) MCG/ACT IN AERS: INHALATION_SPRAY | RESPIRATORY_TRACT | 2 refills | Status: DC

## 2019-08-25 MED ORDER — BREO ELLIPTA 100-25 MCG/INH IN AEPB: INHALATION_SPRAY | RESPIRATORY_TRACT | 4 refills | Status: DC

## 2019-08-25 MED ORDER — ALBUTEROL SULFATE (2.5 MG/3ML) 0.083% IN NEBU: INHALATION_SOLUTION | RESPIRATORY_TRACT | 3 refills | Status: DC

## 2019-08-25 NOTE — Telephone Encounter (Signed)
Please call in the [facility] & Albuterol please send to [facility] on [address] and the mail order

## 2019-08-25 NOTE — Telephone Encounter (Signed)
Patient called stating that the injection she got on 11/24 didn't do any good. She wanted to be sure you was aware.

## 2019-08-25 NOTE — Telephone Encounter (Signed)
Medications sent to pharmacy

## 2019-08-29 DIAGNOSIS — L851 Acquired keratosis [keratoderma] palmaris et plantaris: Secondary | ICD-10-CM | POA: Diagnosis not present

## 2019-08-29 DIAGNOSIS — E1142 Type 2 diabetes mellitus with diabetic polyneuropathy: Secondary | ICD-10-CM | POA: Diagnosis not present

## 2019-08-29 DIAGNOSIS — L603 Nail dystrophy: Secondary | ICD-10-CM | POA: Diagnosis not present

## 2019-09-02 ENCOUNTER — Encounter (HOSPITAL_COMMUNITY): Payer: Self-pay

## 2019-09-02 ENCOUNTER — Other Ambulatory Visit: Payer: Self-pay

## 2019-09-02 ENCOUNTER — Telehealth: Payer: Self-pay | Admitting: *Deleted

## 2019-09-02 ENCOUNTER — Inpatient Hospital Stay (HOSPITAL_COMMUNITY): Payer: Medicare Other

## 2019-09-02 ENCOUNTER — Inpatient Hospital Stay (HOSPITAL_COMMUNITY): Payer: Medicare Other | Attending: Hematology

## 2019-09-02 VITALS — BP 165/67 | HR 59 | Temp 96.9°F | Resp 18

## 2019-09-02 DIAGNOSIS — D649 Anemia, unspecified: Secondary | ICD-10-CM

## 2019-09-02 DIAGNOSIS — N184 Chronic kidney disease, stage 4 (severe): Secondary | ICD-10-CM

## 2019-09-02 DIAGNOSIS — D631 Anemia in chronic kidney disease: Secondary | ICD-10-CM | POA: Diagnosis present

## 2019-09-02 DIAGNOSIS — E611 Iron deficiency: Secondary | ICD-10-CM

## 2019-09-02 LAB — CBC
HCT: 30.5 % — ABNORMAL LOW (ref 36.0–46.0)
Hemoglobin: 9.5 g/dL — ABNORMAL LOW (ref 12.0–15.0)
MCH: 30.2 pg (ref 26.0–34.0)
MCHC: 31.1 g/dL (ref 30.0–36.0)
MCV: 96.8 fL (ref 80.0–100.0)
Platelets: 147 10*3/uL — ABNORMAL LOW (ref 150–400)
RBC: 3.15 MIL/uL — ABNORMAL LOW (ref 3.87–5.11)
RDW: 15.7 % — ABNORMAL HIGH (ref 11.5–15.5)
WBC: 4.1 10*3/uL (ref 4.0–10.5)
nRBC: 0 % (ref 0.0–0.2)

## 2019-09-02 MED ORDER — EPOETIN ALFA-EPBX 40000 UNIT/ML IJ SOLN
40000.0000 [IU] | Freq: Once | INTRAMUSCULAR | Status: AC
  Administered 2019-09-02: 40000 [IU] via SUBCUTANEOUS
  Filled 2019-09-02: qty 1

## 2019-09-02 MED ORDER — SYNTHROID 75 MCG PO TABS: ORAL_TABLET | ORAL | 1 refills | Status: DC

## 2019-09-02 MED ORDER — MECLIZINE HCL 12.5 MG PO TABS: 12.5000 mg | ORAL_TABLET | Freq: Three times a day (TID) | ORAL | 2 refills | Status: DC | PRN

## 2019-09-02 MED ORDER — ALBUTEROL SULFATE (2.5 MG/3ML) 0.083% IN NEBU: INHALATION_SOLUTION | RESPIRATORY_TRACT | 3 refills | Status: DC

## 2019-09-02 NOTE — Progress Notes (Signed)
Patient tolerated injection with no complaints voiced.  Site clean and dry with no bruising or swelling noted at site.  Band aid applied.  Vss with discharge and left ambulatory with no s/s of distress noted.  

## 2019-09-02 NOTE — Telephone Encounter (Signed)
Albuterol sent to local pharmacy per patient request

## 2019-09-02 NOTE — Telephone Encounter (Signed)
Pt would like all prescriptions on [facility] [address] also needs albuterol sent in to them now

## 2019-09-04 ENCOUNTER — Other Ambulatory Visit: Payer: Self-pay

## 2019-09-04 ENCOUNTER — Ambulatory Visit (INDEPENDENT_AMBULATORY_CARE_PROVIDER_SITE_OTHER): Payer: Medicare Other | Admitting: *Deleted

## 2019-09-04 DIAGNOSIS — Z5181 Encounter for therapeutic drug level monitoring: Secondary | ICD-10-CM

## 2019-09-04 DIAGNOSIS — Z952 Presence of prosthetic heart valve: Secondary | ICD-10-CM

## 2019-09-04 LAB — POCT INR: INR: 1.6 — AB (ref 2.0–3.0)

## 2019-09-04 NOTE — Patient Instructions (Signed)
Take coumadin 3 tablets tonight and tomorrow night then resume 2 tablets daily except 1 tablet on Tuesdays  Continue greens Recheck in 2 weeks

## 2019-09-08 ENCOUNTER — Telehealth: Payer: Self-pay | Admitting: *Deleted

## 2019-09-08 NOTE — Telephone Encounter (Signed)
Carelink alert received for "high" LV threshold. LV capture management on monitor only. Presenting rhythm appears similar to previous. Extensive LV vector testing done at 07/03/19 OV. Will plan to bring in for manual threshold testing and turn off LV capture management and LV threshold alerts at that time.  Spoke with patient. She is agreeable to appointment with Jens Som, PA, on 09/24/19 at 11:45am. Will need to have niece present due to poor mobility and to carry O2. No further questions at this time.

## 2019-09-09 NOTE — Progress Notes (Signed)
Remote pacemaker transmission.   

## 2019-09-12 ENCOUNTER — Ambulatory Visit (INDEPENDENT_AMBULATORY_CARE_PROVIDER_SITE_OTHER): Payer: Medicare Other

## 2019-09-12 DIAGNOSIS — Z95810 Presence of automatic (implantable) cardiac defibrillator: Secondary | ICD-10-CM

## 2019-09-12 DIAGNOSIS — I5032 Chronic diastolic (congestive) heart failure: Secondary | ICD-10-CM

## 2019-09-16 NOTE — Progress Notes (Signed)
EPIC Encounter for ICM Monitoring  Patient Name: [name] [name] is a 85 y.o. female Date: 09/16/2019 Primary Care Physican: Fayrene Helper, MD Primary Cardiologist:Croitoru Electrophysiologist:Taylor Bi-V Pacing:99.9% 11/9/2020Weight:182lbs   Spoke with patient and she is asymptomatic.  She is feeling fine.   Optivol thoracic impedancenormal.  Prescribed:Furosemide40 mg 1 tablet daily. Metolazaone 2.5 mg Take 1tablet twicea week (Wednesday and Saturdays). Potassium 20 mEq take 1 tablet daily.  Labs: 08/04/2019 Creatinine 1.45, BUN 38, Potassium 3.9, Sodium 135, GFR 33-38 08/02/2019 Creatinine 1.44, BUN 43, Potassium 4.3, Sodium 134, GFR 33-38 07/07/2019 Creatinine 1.82, BUN 54, Potassium 4.4, Sodium 132, GFR 25-29 05/31/2019 Creatinine1.48, BUN45, Potassium4.4, Sodium132, GFR32-37 05/30/2019 Creatinine1.54, BUN47, Potassium4.2, Sodium131, GFR30-35  05/29/2019 Creatinine1.86, BUN50, Potassium4.6, Sodium129, MBP11-21 A complete set of results can be found in Results Review.  Recommendations: No changes and encouraged to call if experiencing any fluid symptoms.  Follow-up plan: ICM clinic phone appointment on 10/20/2019.  91 day device clinic remote transmission 11/14/2019.  Office visit with Tommye Standard, PA 09/24/2019 due to possible high LV threshold per report     Copy of ICM check sent to Dr. Lovena Le.   3 month ICM trend: 09/15/2019    1 Year ICM trend:       [name], RN 09/16/2019 1:26 PM

## 2019-09-17 ENCOUNTER — Ambulatory Visit (INDEPENDENT_AMBULATORY_CARE_PROVIDER_SITE_OTHER): Payer: Medicare Other | Admitting: *Deleted

## 2019-09-17 ENCOUNTER — Other Ambulatory Visit: Payer: Self-pay

## 2019-09-17 DIAGNOSIS — Z5181 Encounter for therapeutic drug level monitoring: Secondary | ICD-10-CM

## 2019-09-17 DIAGNOSIS — Z952 Presence of prosthetic heart valve: Secondary | ICD-10-CM | POA: Diagnosis not present

## 2019-09-17 LAB — POCT INR: INR: 2.4 (ref 2.0–3.0)

## 2019-09-17 NOTE — Patient Instructions (Signed)
Continue warfarin 2 tablets daily except 1 tablet on Tuesdays  Continue greens Recheck in 4 weeks

## 2019-09-22 ENCOUNTER — Telehealth: Payer: Self-pay

## 2019-09-22 NOTE — Progress Notes (Signed)
Cardiology Office Note Date:  09/24/2019  Patient ID:  [name], [name] [birth year], MRN [id number] PCP:  Fayrene Helper, MD  Cardiologist:  Dr. Sallyanne Kuster Electrophysiologist: Dr. Lovena Le    Chief Complaint: LV lead threshold alert  History of Present Illness: [name] [name] is a 85 y.o. female with history of DM, hypothyroidism, NICM with subsequent improvement in LVEF, CHB w/CRT-P, COPD w/home O2, permanent Afib/ flutter, VHD w/ mechanical AVR (2001) and MVR (2004), chronic multifactorial anemia, including a component of prosthesis related hemolytic anemia (follows with oncology), CKD (III), AAA follows annually with [facility] (Dr. Donnetta Hutching).  She comes in today to be seen for Dr.Taylor.  Last seen by him in Aug 2020, at that time, mentioned more sedentary, minimal edema, no symptoms She was in AFlutter, rate controlled, not felt a good candidate for rhythm control to defer to rate control strategy for her, on warfarin for a/c.  She saw Dr. Sallyanne Kuster only a couple days later, no changes were made.  She had an ER visit 08/03/2019 for CP.  HS Trop mildly elevated/flat and at her known baseline, ER MD note reports low suspicion for ACS, and in d/w Dr. Sallyanne Kuster, Rx s/l NTG (ICM notes look like she may have been volume OL, was trending downward at that time.  She is coming today accompanied by her niece, the patient in a wheelchair.    She is wearing her Oxygen, mentions her PMD told her to wear it all of the time now, but dr. Lovena Le had told her to try not to use it.  (I recommended she continue with her PMD who is managing her oxygen) She has not had recurrent CP, she thinks this was gas, also had some breast discomfort that resolved.  No CP or "heart pains".  No palpitations.  No dizziness, near syncope or syncope. She has some baseline SOB, unchanged.  "I breath OK" She mentions she is up and around, though generally not feeling like she has much energy.  Device information MDT CRT-P  Most  recent gen change may 2020 (downgrade from ICD) RA lead implanted 2004 RV lead placement 2007  LV lead 2016 STJude lead Original RV pacing lead was extracted at time of CRT upgrade 2016    Past Medical History:  Diagnosis Date  . Adenomatous polyp of colon 12/16/2002   Dr. [name]/[facility]  . Allergy   . Cataract   . CHB (complete heart block) ([place]) 10/07/2014      . CHF (congestive heart failure) ([place])    a.  EF previously reduced to 25-30% b. EF improved to 60-65% by echo in 10/2017.   Marland Kitchen Chronic kidney disease, stage I 2006  . Constipation       . COPD (chronic obstructive pulmonary disease) ([place])       . DJD (degenerative joint disease) of lumbar spine   . DM (diabetes mellitus) ([place]) 2006  . Esophageal reflux   . Glaucoma   . Gout       . H/O heart valve replacement with mechanical valve    a. s/p mechanical AVR in 2001 and mechanical MVR in 2004.  Marland Kitchen H/O wisdom tooth extraction   . Hyperlipemia   . IDA (iron deficiency anemia)    Parenteral iron/[name]  . Insomnia       . Non-ischemic cardiomyopathy ([place])    a. s/p MDT CRTD  . Obesity, unspecified   . Other and unspecified hyperlipidemia   . Oxygen deficiency  2014   nocturnal;  . Spondylolisthesis   . Spondylosis   . Thyroid cancer ([place])    remote thyroidectomy, no recurrence, pt denies in 2016 thyroid cancer  . Type 2 diabetes mellitus with hyperglycemia ([place]) 07/15/2010  . Unspecified hypothyroidism       . 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 Vertigo         Past Surgical History:  Procedure Laterality Date  . AORTIC AND MITRAL VALVE REPLACEMENT  2001  . BI-VENTRICULAR IMPLANTABLE CARDIOVERTER DEFIBRILLATOR UPGRADE N/A 01/18/2015   Procedure: BI-VENTRICULAR IMPLANTABLE CARDIOVERTER DEFIBRILLATOR UPGRADE;  Surgeon: Evans Lance, MD;  Location: [facility] CATH LAB;  Service: Cardiovascular;  Laterality: N/A;  . BIV ICD GENERATOR CHANGEOUT N/A  01/30/2019   Procedure: BIV ICD GENERATOR CHANGEOUT;  Surgeon: Evans Lance, MD;  Location: [facility] CV LAB;  Service: Cardiovascular;  Laterality: N/A;  . CARDIAC CATHETERIZATION    . CARDIAC VALVE REPLACEMENT    . CARDIOVERSION N/A 11/13/2016   Procedure: CARDIOVERSION;  Surgeon: Sanda Klein, MD;  Location: [facility] ENDOSCOPY;  Service: Cardiovascular;  Laterality: N/A;  . COLONOSCOPY  06/12/2007   Rourk- Normal rectum/Normal colon  . COLONOSCOPY  12/16/02   small hemorrhoids  . COLONOSCOPY  06/20/2012   Procedure: COLONOSCOPY;  Surgeon: Daneil Dolin, MD;  Location: [facility] ENDO [facility];  Service: Endoscopy;  Laterality: N/A;  10:45  . defibrillator implanted 2006    . DOPPLER ECHOCARDIOGRAPHY  2012  . DOPPLER ECHOCARDIOGRAPHY  05,06,07,08,09,2011  . ESOPHAGOGASTRODUODENOSCOPY  06/12/2007   Rourk- normal esophagus, small hiatal hernia, otherwise normal stomach, D1, D2  . EYE SURGERY Right 2005  . EYE SURGERY Left 2006  . ICD LEAD REMOVAL Left 02/08/2015   Procedure: ICD LEAD REMOVAL;  Surgeon: Evans Lance, MD;  Location: [facility] OR;  Service: Cardiovascular;  Laterality: Left;  "Will plan extraction and insertion of a BiV PM"  **Dr. Roxan Hockey backing up case**  . IMPLANTABLE CARDIOVERTER DEFIBRILLATOR (ICD) GENERATOR CHANGE Left 02/08/2015   Procedure: ICD GENERATOR CHANGE;  Surgeon: Evans Lance, MD;  Location: [facility];  Service: Cardiovascular;  Laterality: Left;  . INSERT / REPLACE / REMOVE PACEMAKER    . PACEMAKER INSERTION  May 2016  . pacemaker placed  2004  . right breast cyst removed     benign   . right cataract removed     2005  . TEE WITHOUT CARDIOVERSION N/A 11/13/2016   Procedure: TRANSESOPHAGEAL ECHOCARDIOGRAM (TEE);  Surgeon: Sanda Klein, MD;  Location: [facility] ENDOSCOPY;  Service: Cardiovascular;  Laterality: N/A;  . THYROIDECTOMY      Current Outpatient Medications  Medication Sig Dispense Refill  . acetaminophen (TYLENOL) 500 MG tablet Take 500 mg by mouth every 6 (six)  hours as needed (pain).    Marland Kitchen albuterol (PROVENTIL) (2.5 MG/3ML) 0.083% nebulizer solution 90INHALE 1 VIAL VIA NEBULIZER THREE TIMES DAILY. 300 mL 3  . albuterol (VENTOLIN HFA) 108 (90 Base) MCG/ACT inhaler INHALE 2 PUFFS INTO THE LUNGS EVERY 6 HOURS AS NEEDED FOR WHEEZING OR SHORTNESS OF BREATH 54 g 2  . allopurinol (ZYLOPRIM) 100 MG tablet TAKE 1 TABLET(100 MG) BY MOUTH DAILY (Patient taking differently: Take 100 mg by mouth daily. ) 90 tablet 1  . BREO ELLIPTA 100-25 MCG/INH AEPB 1 TAKE 1 INHALATION BY MOUTH ONCE DAILY 1 each 4  . cholecalciferol (VITAMIN D3) 25 MCG (1000 UT) tablet Take 1,000 Units by mouth daily.    Marland Kitchen docusate sodium (COLACE) 100 MG capsule Take 300  mg by mouth at bedtime.     . fluticasone (FLONASE) 50 MCG/ACT nasal spray SHAKE LIQUID AND USE 2 SPRAYS IN EACH NOSTRIL DAILY (Patient taking differently: Place 2 sprays into both nostrils daily. ) 48 g 1  . folic acid (FOLVITE) 1 MG tablet Take 1 tablet (1 mg total) by mouth daily. 90 tablet 1  . furosemide (LASIX) 40 MG tablet Take 1 tablet (40 mg total) by mouth daily. 90 tablet 1  . gabapentin (NEURONTIN) 300 MG capsule Take 1 capsule (300 mg total) by mouth 2 (two) times daily. 60 capsule 2  . hydrALAZINE (APRESOLINE) 25 MG tablet Take 1 tablet (25 mg total) by mouth 2 (two) times daily. 180 tablet 3  . HYDROcodone-acetaminophen (NORCO) 7.5-325 MG tablet Take 1 tablet by mouth every 6 (six) hours as needed for moderate pain (Must last 30 days.). 60 tablet 0  . Liniments (SALONPAS PAIN RELIEF PATCH EX) Apply 1 patch topically daily as needed (pain).    . meclizine (ANTIVERT) 12.5 MG tablet Take 1 tablet (12.5 mg total) by mouth 3 (three) times daily as needed for dizziness. 30 tablet 2  . metolazone (ZAROXOLYN) 2.5 MG tablet Take 1 tablet (2.5 mg total) by mouth 2 (two) times a week. Wednesdays and Saturdays Skip dose on 1/4 and start from 1/8 (Patient taking differently: Take 2.5 mg by mouth 2 (two) times a week. Wednesdays and  Saturdays) 30 tablet 6  . metoprolol succinate (TOPROL-XL) 100 MG 24 hr tablet TAKE 1 TABLET BY MOUTH DAILY. TAKE WITH OR IMMEDIATELY FOLLOWING A MEAL. (Patient taking differently: Take 100 mg by mouth daily. TAKE WITH OR IMMEDIATELY FOLLOWING A MEAL.) 90 tablet 1  . Multiple Vitamin (MULTIVITAMIN) LIQD Take 5 mLs by mouth daily.    . nitroGLYCERIN (NITROSTAT) 0.4 MG SL tablet Place 1 tablet (0.4 mg total) under the tongue every 5 (five) minutes as needed for chest pain. 30 tablet 0  . ondansetron (ZOFRAN) 4 MG tablet Take 1 tablet (4 mg total) by mouth daily as needed for nausea or vomiting. 90 tablet 1  . pantoprazole (PROTONIX) 40 MG tablet Take 1 tablet (40 mg total) by mouth daily. 30 tablet 1  . Polyethyl [name] (SYSTANE) 0.4-0.3 % GEL ophthalmic gel Place 1 application into the left eye at bedtime.    . potassium chloride SA (KLOR-CON) 20 MEQ tablet Take 1 tablet (20 mEq total) by mouth daily. 90 tablet 1  . pravastatin (PRAVACHOL) 40 MG tablet Take 1 tablet (40 mg total) by mouth every evening. 90 tablet 3  . SYNTHROID 75 MCG tablet TAKE 1 TABLET BY MOUTH DAILY 90 tablet 1  . warfarin (JANTOVEN) 5 MG tablet Take as directed by coumadin clinic (Patient taking differently: Take 5-10 mg by mouth See admin instructions. Take 5mg  on Tuesdays and Saturdays. Takes 10mg  on all other days) 60 tablet 3  . isosorbide mononitrate (IMDUR) 30 MG 24 hr tablet Take 1 tablet (30 mg total) by mouth daily. 30 tablet 6   No current facility-administered medications for this visit.   Facility-Administered Medications Ordered in Other Visits  Medication Dose Route Frequency Provider Last Rate Last Admin  . epoetin alfa (EPOGEN,PROCRIT) injection 40,000 Units  40,000 Units Subcutaneous Once Farrel Gobble, MD        Allergies:   Penicillins, Aspirin, Fentanyl, and Niacin   Social History:  The patient  reports that she quit smoking about 32 years ago. Her smoking use included cigarettes. She  has a 30.00 pack-year  smoking history. She has never used smokeless tobacco. She reports that she does not drink alcohol or use drugs.   Family History:  The patient's family history includes Diabetes in her brother and brother; Heart attack in her sister; Heart disease in her brother, brother, father, and sister; Hypertension in her brother; Lung cancer in her brother; Pancreatic cancer in her mother.  ROS:  Please see the history of present illness. All other systems are reviewed and otherwise negative.   PHYSICAL EXAM:  VS:  BP (!) 152/75   Pulse 60   Ht 5\' 7"  (1.702 m)   Wt 193 lb (87.5 kg)   BMI 30.23 kg/m  BMI: Body mass index is 30.23 kg/m. Well nourished, well developed, in no acute distress  HEENT: normocephalic, atraumatic  Neck: no JVD, carotid bruits or masses Cardiac: RRR; Valves apprciead, no rubs, or gallops Lungs:  CTA b/l, no wheezing, rhonchi or rales  Abd: soft, nontender, obese MS: no deformity, age appropriate atrophy Ext: trace edema  Skin: warm and dry, no rash Neuro:  No gross deficits appreciated Psych: euthymic mood, full affect  PPM site is stable, no tethering or discomfort   EKG:  Not done today  PPM interrogation done today and reviewed by myself:  Battery estimate is 5 years RV lead measurements are OK, stable V paces at 40 today VP 99.9% Effective 81.0% LV lead impedance is is good, LV lead threshold is increased today 3.5V/1.26ms  In review, at her last in clinic check was noted to have LV lead thresholds trending upards and vectors checked programmed to current Tried alternative vectors again today, none were better then LV3->LV2 LV lead amplutide/output increased to 4.0V/1,92ms This estimates battery longevity to 4.3 years   03/18/2019 TTE IMPRESSIONS  1. A St. Jude valve is present in the aortic position. It is functioning normally.  2. A St. Jude valve is present in the mitral position. It is functioning normally.  3. The left  ventricle has a visually estimated ejection fraction of 55%. The cavity size was normal. There is mild concentric left ventricular hypertrophy. Left ventricular diastolic Doppler parameters are indeterminate due to mitral valve  replacement/repair.  4. The aortic root is normal in size and structure.  5. The tricuspid valve is grossly normal. Tricuspid valve regurgitation is moderate.  6. Trivial pericardial effusion is present.  7. Left atrial size was severely dilated.  8. Right atrial size was moderately dilated.  9. The inferior vena cava was dilated in size with >50% respiratory variability.    Recent Labs: 09/30/2018: Magnesium 2.0 03/20/2019: B Natriuretic Peptide 497.0 07/07/2019: TSH 0.795 08/04/2019: ALT 11; BUN 38; Creatinine, Ser 1.45; Potassium 3.9; Sodium 135 09/02/2019: Hemoglobin 9.5; Platelets 147  07/07/2019: Cholesterol 123; HDL 48; LDL Cholesterol 63; Total CHOL/HDL Ratio 2.6; Triglycerides 61; VLDL 12   CrCl cannot be calculated (Patient's most recent lab result is older than the maximum 21 days allowed.).   Wt Readings from Last 3 Encounters:  09/24/19 193 lb (87.5 kg)  08/19/19 187 lb (84.8 kg)  08/04/19 182 lb 11.2 oz (82.9 kg)     Other studies reviewed: Additional studies/records reviewed today include: summarized above  ASSESSMENT AND PLAN:  1. CRT-P     LV lead threshold is trending upwards     I left the LV lead on monitor given she did infact have further increase in threshold so we are made aware if further increases happen       2. VHD  W/ mechanical AVR and MVR      Echo in June both functioning normally 3. Permanent AFib      Warfarin monitored and managed with the coumadin clinic       She denies any bleeding or signs of bleeding      H/H looks stable by recent labs  4. CP     No an ongoing complaint     C/w Dr. Sallyanne Kuster  5. HTN     A little high today, was better last month at her ER visist     Reports lower at home     They are  asked to keep an eye on it  6. NICM     Optivol is well below threshold     No exam findings to suggest overt volume OL     Weight looks up in Epic, though at her last inclinic visit with Dr. Loletha Grayer in Aug was 199lbs, today is 193      Echo in June LVEF 55%     Hopefully her effective BV pacing % will improve with increased LV lead outputs    Disposition: F/u with device clinic in clinic in 3 mo, monitor LV lead function closely, she has Dr. Sallyanne Kuster and coumadin clinic follow up in place  Current medicines are reviewed at length with the patient today.  The patient did not have any concerns regarding medicines.  Venetia Night, PA-C 09/24/2019 1:38 PM     [address] [place] [id number] [phone] (office)  [phone] (fax)

## 2019-09-22 NOTE — Telephone Encounter (Signed)
Pt had questions about her appointment with RU 09-24-2019. I told her they are going to check her because we received an alert on her monitor. She asked was we going to cut on her? I told her know. I told her they will check her with the programmer and do some adjustments.

## 2019-09-24 ENCOUNTER — Other Ambulatory Visit: Payer: Self-pay

## 2019-09-24 ENCOUNTER — Ambulatory Visit (INDEPENDENT_AMBULATORY_CARE_PROVIDER_SITE_OTHER): Payer: Medicare Other | Admitting: Physician Assistant

## 2019-09-24 VITALS — BP 152/75 | HR 60 | Ht 67.0 in | Wt 193.0 lb

## 2019-09-24 DIAGNOSIS — Z952 Presence of prosthetic heart valve: Secondary | ICD-10-CM | POA: Diagnosis not present

## 2019-09-24 DIAGNOSIS — I255 Ischemic cardiomyopathy: Secondary | ICD-10-CM

## 2019-09-24 DIAGNOSIS — Z950 Presence of cardiac pacemaker: Secondary | ICD-10-CM

## 2019-09-24 DIAGNOSIS — Z9889 Other specified postprocedural states: Secondary | ICD-10-CM

## 2019-09-24 DIAGNOSIS — I10 Essential (primary) hypertension: Secondary | ICD-10-CM

## 2019-09-24 DIAGNOSIS — I428 Other cardiomyopathies: Secondary | ICD-10-CM

## 2019-09-24 NOTE — Patient Instructions (Addendum)
Medication Instructions:   Your physician recommends that you continue on your current medications as directed. Please refer to the Current Medication list given to you today.  *If you need a refill on your cardiac medications before your next appointment, please call your pharmacy*  Lab Work:   If you have labs (blood work) drawn today and your tests are completely normal, you will receive your results only by: Marland Kitchen MyChart Message (if you have MyChart) OR . A paper copy in the mail If you have any lab test that is abnormal or we need to change your treatment, we will call you to review the results.  Testing/Procedures: A chest x-ray takes a picture of the organs and structures inside the chest, including the heart, lungs, and blood vessels. This test can show several things, including, whether the heart is enlarges; whether fluid is building up in the lungs; and whether pacemaker / defibrillator leads are still in place.  Address: [address], [place], [place] [address]   Follow-Up: At [facility], you and your health needs are our priority.  As part of our continuing mission to provide you with exceptional heart care, we have created designated Provider Care Teams.  These Care Teams include your primary Cardiologist (physician) and Advanced Practice Providers (APPs -  Physician Assistants and Nurse Practitioners) who all work together to provide you with the care you need, when you need it.  Your next appointment:    2-3 month(s) Dr  Lovena Le or App  Dr Orene Desanctis in February   The format for your next appointment:   In Person  Provider:     Other Instructions

## 2019-09-30 ENCOUNTER — Inpatient Hospital Stay (HOSPITAL_BASED_OUTPATIENT_CLINIC_OR_DEPARTMENT_OTHER): Payer: Medicare Other | Admitting: Nurse Practitioner

## 2019-09-30 ENCOUNTER — Inpatient Hospital Stay (HOSPITAL_COMMUNITY): Payer: Medicare Other | Attending: Hematology

## 2019-09-30 ENCOUNTER — Encounter (HOSPITAL_COMMUNITY): Payer: Self-pay | Admitting: Nurse Practitioner

## 2019-09-30 ENCOUNTER — Other Ambulatory Visit: Payer: Self-pay

## 2019-09-30 ENCOUNTER — Inpatient Hospital Stay (HOSPITAL_COMMUNITY): Payer: Medicare Other

## 2019-09-30 DIAGNOSIS — E611 Iron deficiency: Secondary | ICD-10-CM

## 2019-09-30 DIAGNOSIS — D649 Anemia, unspecified: Secondary | ICD-10-CM

## 2019-09-30 DIAGNOSIS — N189 Chronic kidney disease, unspecified: Secondary | ICD-10-CM | POA: Insufficient documentation

## 2019-09-30 DIAGNOSIS — E1122 Type 2 diabetes mellitus with diabetic chronic kidney disease: Secondary | ICD-10-CM | POA: Insufficient documentation

## 2019-09-30 DIAGNOSIS — N184 Chronic kidney disease, stage 4 (severe): Secondary | ICD-10-CM

## 2019-09-30 DIAGNOSIS — D631 Anemia in chronic kidney disease: Secondary | ICD-10-CM | POA: Diagnosis not present

## 2019-09-30 LAB — CBC WITH DIFFERENTIAL/PLATELET
Abs Immature Granulocytes: 0.03 10*3/uL (ref 0.00–0.07)
Basophils Absolute: 0 10*3/uL (ref 0.0–0.1)
Basophils Relative: 1 %
Eosinophils Absolute: 0 10*3/uL (ref 0.0–0.5)
Eosinophils Relative: 0 %
HCT: 32.8 % — ABNORMAL LOW (ref 36.0–46.0)
Hemoglobin: 10.1 g/dL — ABNORMAL LOW (ref 12.0–15.0)
Immature Granulocytes: 1 %
Lymphocytes Relative: 15 %
Lymphs Abs: 0.8 10*3/uL (ref 0.7–4.0)
MCH: 29.9 pg (ref 26.0–34.0)
MCHC: 30.8 g/dL (ref 30.0–36.0)
MCV: 97 fL (ref 80.0–100.0)
Monocytes Absolute: 0.5 10*3/uL (ref 0.1–1.0)
Monocytes Relative: 9 %
Neutro Abs: 4.1 10*3/uL (ref 1.7–7.7)
Neutrophils Relative %: 74 %
Platelets: 152 10*3/uL (ref 150–400)
RBC: 3.38 MIL/uL — ABNORMAL LOW (ref 3.87–5.11)
RDW: 15.8 % — ABNORMAL HIGH (ref 11.5–15.5)
WBC: 5.5 10*3/uL (ref 4.0–10.5)
nRBC: 0 % (ref 0.0–0.2)

## 2019-09-30 LAB — COMPREHENSIVE METABOLIC PANEL
ALT: 13 U/L (ref 0–44)
AST: 25 U/L (ref 15–41)
Albumin: 3.8 g/dL (ref 3.5–5.0)
Alkaline Phosphatase: 47 U/L (ref 38–126)
Anion gap: 10 (ref 5–15)
BUN: 50 mg/dL — ABNORMAL HIGH (ref 8–23)
CO2: 28 mmol/L (ref 22–32)
Calcium: 9.3 mg/dL (ref 8.9–10.3)
Chloride: 101 mmol/L (ref 98–111)
Creatinine, Ser: 1.68 mg/dL — ABNORMAL HIGH (ref 0.44–1.00)
GFR calc Af Amer: 32 mL/min — ABNORMAL LOW (ref >60)
GFR calc non Af Amer: 27 mL/min — ABNORMAL LOW (ref >60)
Glucose, Bld: 91 mg/dL (ref 70–99)
Potassium: 4.8 mmol/L (ref 3.5–5.1)
Sodium: 139 mmol/L (ref 135–145)
Total Bilirubin: 0.5 mg/dL (ref 0.3–1.2)
Total Protein: 7.7 g/dL (ref 6.5–8.1)

## 2019-09-30 LAB — IRON AND TIBC
Iron: 47 ug/dL (ref 28–170)
Saturation Ratios: 17 % (ref 10.4–31.8)
TIBC: 274 ug/dL (ref 250–450)
UIBC: 227 ug/dL

## 2019-09-30 LAB — VITAMIN B12: Vitamin B-12: 299 pg/mL (ref 180–914)

## 2019-09-30 LAB — LACTATE DEHYDROGENASE: LDH: 204 U/L — ABNORMAL HIGH (ref 98–192)

## 2019-09-30 LAB — FERRITIN: Ferritin: 484 ng/mL — ABNORMAL HIGH (ref 11–307)

## 2019-09-30 LAB — FOLATE: Folate: 65 ng/mL (ref >5.9)

## 2019-09-30 LAB — VITAMIN D 25 HYDROXY (VIT D DEFICIENCY, FRACTURES): Vit D, 25-Hydroxy: 58.35 ng/mL (ref 30–100)

## 2019-09-30 MED ORDER — EPOETIN ALFA-EPBX 40000 UNIT/ML IJ SOLN
INTRAMUSCULAR | Status: AC
  Filled 2019-09-30: qty 1

## 2019-09-30 MED ORDER — EPOETIN ALFA-EPBX 40000 UNIT/ML IJ SOLN
40000.0000 [IU] | Freq: Once | INTRAMUSCULAR | Status: AC
  Administered 2019-09-30: 40000 [IU] via SUBCUTANEOUS

## 2019-09-30 NOTE — Progress Notes (Signed)
[place], [place] [address]   CLINIC:  Medical Oncology/Hematology  PCP:  Fayrene Helper, MD [address], [place]  [place] [id number] [phone]   REASON FOR VISIT: Follow-up for iron deficiency anemia  CURRENT THERAPY: Monthly Procrit injections   INTERVAL HISTORY:  [name] 86 y.o. female returns for routine follow-up for iron deficiency anemia.  Patient reports she has been doing well since her last visit.  She does have problems with constipation and intermittent dizziness.  She reports she is not very active during the day and she takes frequent naps.  She denies any bright red bleeding per rectum or melena.  She denies any easy bruising or bleeding. Denies any nausea, vomiting, or diarrhea. Denies any new pains. Had not noticed any recent bleeding such as epistaxis, hematuria or hematochezia. Denies recent chest pain on exertion, shortness of breath on minimal exertion, pre-syncopal episodes, or palpitations. Denies any numbness or tingling in hands or feet. Denies any recent fevers, infections, or recent hospitalizations. Patient reports appetite at 75% and energy level at 25%.  She is eating well maintain her weight at this time.    REVIEW OF SYSTEMS:  Review of Systems  Constitutional: Positive for fatigue.  Gastrointestinal: Positive for constipation.  Neurological: Positive for dizziness.  All other systems reviewed and are negative.    PAST MEDICAL/SURGICAL HISTORY:  Past Medical History:  Diagnosis Date  . Adenomatous polyp of colon 12/16/2002   Dr. [name]/[facility]  . Allergy   . Cataract   . CHB (complete heart block) ([place]) 10/07/2014      . CHF (congestive heart failure) ([place])    a.  EF previously reduced to 25-30% b. EF improved to 60-65% by echo in 10/2017.   Marland Kitchen Chronic kidney disease, stage I 2006  . Constipation       . COPD (chronic obstructive pulmonary disease)  ([place])       . DJD (degenerative joint disease) of lumbar spine   . DM (diabetes mellitus) ([place]) 2006  . Esophageal reflux   . Glaucoma   . Gout       . H/O heart valve replacement with mechanical valve    a. s/p mechanical AVR in 2001 and mechanical MVR in 2004.  Marland Kitchen H/O wisdom tooth extraction   . Hyperlipemia   . IDA (iron deficiency anemia)    Parenteral iron/[name]  . Insomnia       . Non-ischemic cardiomyopathy ([place])    a. s/p MDT CRTD  . Obesity, unspecified   . Other and unspecified hyperlipidemia   . Oxygen deficiency 2014   nocturnal;  . Spondylolisthesis   . Spondylosis   . Thyroid cancer ([place])    remote thyroidectomy, no recurrence, pt denies in 2016 thyroid cancer  . Type 2 diabetes mellitus with hyperglycemia ([place]) 07/15/2010  . Unspecified hypothyroidism       . 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 Vertigo        Past Surgical History:  Procedure Laterality Date  . AORTIC AND MITRAL VALVE REPLACEMENT  2001  . BI-VENTRICULAR IMPLANTABLE CARDIOVERTER DEFIBRILLATOR UPGRADE N/A 01/18/2015   Procedure: BI-VENTRICULAR IMPLANTABLE CARDIOVERTER DEFIBRILLATOR UPGRADE;  Surgeon: Evans Lance, MD;  Location: [facility] CATH LAB;  Service: Cardiovascular;  Laterality: N/A;  . BIV ICD GENERATOR CHANGEOUT N/A 01/30/2019   Procedure: BIV ICD GENERATOR CHANGEOUT;  Surgeon: Evans Lance, MD;  Location:  [facility] INVASIVE CV LAB;  Service: Cardiovascular;  Laterality: N/A;  . CARDIAC CATHETERIZATION    . CARDIAC VALVE REPLACEMENT    . CARDIOVERSION N/A 11/13/2016   Procedure: CARDIOVERSION;  Surgeon: Sanda Klein, MD;  Location: [facility] ENDOSCOPY;  Service: Cardiovascular;  Laterality: N/A;  . COLONOSCOPY  06/12/2007   Rourk- Normal rectum/Normal colon  . COLONOSCOPY  12/16/02   small hemorrhoids  . COLONOSCOPY  06/20/2012   Procedure: COLONOSCOPY;  Surgeon: Daneil Dolin, MD;  [facility]: [facility] ENDO SUITE;  Service: Endoscopy;  Laterality: N/A;  10:45  .  defibrillator implanted 2006    . DOPPLER ECHOCARDIOGRAPHY  2012  . DOPPLER ECHOCARDIOGRAPHY  05,06,07,08,09,2011  . ESOPHAGOGASTRODUODENOSCOPY  06/12/2007   Rourk- normal esophagus, small hiatal hernia, otherwise normal stomach, D1, D2  . EYE SURGERY Right 2005  . EYE SURGERY Left 2006  . ICD LEAD REMOVAL Left 02/08/2015   Procedure: ICD LEAD REMOVAL;  Surgeon: Evans Lance, MD;  Location: [facility] OR;  Service: Cardiovascular;  Laterality: Left;  "Will plan extraction and insertion of a BiV PM"  **Dr. Roxan Hockey backing up case**  . IMPLANTABLE CARDIOVERTER DEFIBRILLATOR (ICD) GENERATOR CHANGE Left 02/08/2015   Procedure: ICD GENERATOR CHANGE;  Surgeon: Evans Lance, MD;  Location: [facility];  Service: Cardiovascular;  Laterality: Left;  . INSERT / REPLACE / REMOVE PACEMAKER    . PACEMAKER INSERTION  May 2016  . pacemaker placed  2004  . right breast cyst removed     benign   . right cataract removed     2005  . TEE WITHOUT CARDIOVERSION N/A 11/13/2016   Procedure: TRANSESOPHAGEAL ECHOCARDIOGRAM (TEE);  Surgeon: Sanda Klein, MD;  Location: [facility] ENDOSCOPY;  Service: Cardiovascular;  Laterality: N/A;  . THYROIDECTOMY       SOCIAL HISTORY:  Social History   Socioeconomic History  . Marital status: Widowed    Spouse name: Not on file  . Number of children: 0  . Years of education: Not on file  . Highest education level: Not on file  Occupational History  . Occupation: retired    Fish farm manager: RETIRED  Tobacco Use  . Smoking status: Former Smoker    Packs/day: 0.75    Years: 40.00    Pack years: 30.00    Types: Cigarettes    Quit date: 03/08/1987    Years since quitting: 32.5  . Smokeless tobacco: Never Used  Substance and Sexual Activity  . Alcohol use: No    Alcohol/week: 0.0 standard drinks  . Drug use: No  . Sexual activity: Not Currently  Other Topics Concern  . Not on file  Social History Narrative   From [place] (2004)   Social Determinants of Health   Financial  Resource Strain: Medium Risk  . Difficulty of Paying Living Expenses: Somewhat hard  Food Insecurity: No Food Insecurity  . Worried About Charity fundraiser in the Last Year: Never true  . Ran Out of Food in the Last Year: Never true  Transportation Needs: No Transportation Needs  . Lack of Transportation (Medical): No  . Lack of Transportation (Non-Medical): No  Physical Activity: Insufficiently Active  . Days of Exercise per Week: 2 days  . Minutes of Exercise per Session: 20 min  Stress: No Stress Concern Present  . Feeling of Stress : Not at all  Social Connections: Somewhat Isolated  . Frequency of Communication with Friends and Family: More than three times a week  . Frequency of Social Gatherings with Friends and Family: Never  .  Attends Religious Services: More than 4 times per year  . Active Member of Clubs or Organizations: No  . Attends Archivist Meetings: Never  . Marital Status: Widowed  Intimate Partner Violence: Not At Risk  . Fear of Current or Ex-Partner: No  . Emotionally Abused: No  . Physically Abused: No  . Sexually Abused: No    FAMILY HISTORY:  Family History  Problem Relation Age of Onset  . Pancreatic cancer Mother   . Heart disease Father   . Heart disease Sister   . Heart attack Sister   . Heart disease Brother   . Diabetes Brother   . Heart disease Brother   . Diabetes Brother   . Hypertension Brother   . Lung cancer Brother     CURRENT MEDICATIONS:  Outpatient Encounter Medications as of 09/30/2019  Medication Sig  . allopurinol (ZYLOPRIM) 100 MG tablet TAKE 1 TABLET(100 MG) BY MOUTH DAILY (Patient taking differently: Take 100 mg by mouth daily. )  . BREO ELLIPTA 100-25 MCG/INH AEPB 1 TAKE 1 INHALATION BY MOUTH ONCE DAILY  . cholecalciferol (VITAMIN D3) 25 MCG (1000 UT) tablet Take 1,000 Units by mouth daily.  Marland Kitchen docusate sodium (COLACE) 100 MG capsule Take 300 mg by mouth at bedtime.   . fluticasone (FLONASE) 50 MCG/ACT nasal  spray SHAKE LIQUID AND USE 2 SPRAYS IN EACH NOSTRIL DAILY (Patient taking differently: Place 2 sprays into both nostrils daily. )  . folic acid (FOLVITE) 1 MG tablet Take 1 tablet (1 mg total) by mouth daily.  . furosemide (LASIX) 40 MG tablet Take 1 tablet (40 mg total) by mouth daily.  Marland Kitchen gabapentin (NEURONTIN) 300 MG capsule Take 1 capsule (300 mg total) by mouth 2 (two) times daily.  . hydrALAZINE (APRESOLINE) 25 MG tablet Take 1 tablet (25 mg total) by mouth 2 (two) times daily.  . isosorbide mononitrate (IMDUR) 30 MG 24 hr tablet Take 1 tablet (30 mg total) by mouth daily.  . metolazone (ZAROXOLYN) 2.5 MG tablet Take 1 tablet (2.5 mg total) by mouth 2 (two) times a week. Wednesdays and Saturdays Skip dose on 1/4 and start from 1/8 (Patient taking differently: Take 2.5 mg by mouth 2 (two) times a week. Wednesdays and Saturdays)  . metoprolol succinate (TOPROL-XL) 100 MG 24 hr tablet TAKE 1 TABLET BY MOUTH DAILY. TAKE WITH OR IMMEDIATELY FOLLOWING A MEAL. (Patient taking differently: Take 100 mg by mouth daily. TAKE WITH OR IMMEDIATELY FOLLOWING A MEAL.)  . Multiple Vitamin (MULTIVITAMIN) LIQD Take 5 mLs by mouth daily.  [name] [name] (SYSTANE) 0.4-0.3 % GEL ophthalmic gel Place 1 application into the left eye at bedtime.  . potassium chloride SA (KLOR-CON) 20 MEQ tablet Take 1 tablet (20 mEq total) by mouth daily.  . pravastatin (PRAVACHOL) 40 MG tablet Take 1 tablet (40 mg total) by mouth every evening.  Marland Kitchen SYNTHROID 75 MCG tablet TAKE 1 TABLET BY MOUTH DAILY  . warfarin (JANTOVEN) 5 MG tablet Take as directed by coumadin clinic (Patient taking differently: Take 5-10 mg by mouth See admin instructions. Take 5mg  on Tuesdays and Saturdays. Takes 10mg  on all other days)  . [DISCONTINUED] pantoprazole (PROTONIX) 40 MG tablet Take 1 tablet (40 mg total) by mouth daily.  Marland Kitchen acetaminophen (TYLENOL) 500 MG tablet Take 500 mg by mouth every 6 (six) hours as needed (pain).  Marland Kitchen albuterol  (PROVENTIL) (2.5 MG/3ML) 0.083% nebulizer solution 90INHALE 1 VIAL VIA NEBULIZER THREE TIMES DAILY. (Patient not taking: Reported on 09/30/2019)  .  albuterol (VENTOLIN HFA) 108 (90 Base) MCG/ACT inhaler INHALE 2 PUFFS INTO THE LUNGS EVERY 6 HOURS AS NEEDED FOR WHEEZING OR SHORTNESS OF BREATH (Patient not taking: Reported on 09/30/2019)  . HYDROcodone-acetaminophen (NORCO) 7.5-325 MG tablet Take 1 tablet by mouth every 6 (six) hours as needed for moderate pain (Must last 30 days.). (Patient not taking: Reported on 09/30/2019)  . Liniments (SALONPAS PAIN RELIEF PATCH EX) Apply 1 patch topically daily as needed (pain).  . meclizine (ANTIVERT) 12.5 MG tablet Take 1 tablet (12.5 mg total) by mouth 3 (three) times daily as needed for dizziness. (Patient not taking: Reported on 09/30/2019)  . nitroGLYCERIN (NITROSTAT) 0.4 MG SL tablet Place 1 tablet (0.4 mg total) under the tongue every 5 (five) minutes as needed for chest pain. (Patient not taking: Reported on 09/30/2019)  . ondansetron (ZOFRAN) 4 MG tablet Take 1 tablet (4 mg total) by mouth daily as needed for nausea or vomiting. (Patient not taking: Reported on 09/30/2019)   Facility-Administered Encounter Medications as of 09/30/2019  Medication  . epoetin alfa (EPOGEN,PROCRIT) injection 40,000 Units    ALLERGIES:  Allergies  Allergen Reactions  . Penicillins Other (See Comments)    Bruise Did it involve swelling of the face/tongue/throat, SOB, or low BP? No Did it involve sudden or severe rash/hives, skin peeling, or any reaction on the inside of your mouth or nose? No Did you need to seek medical attention at a hospital or doctor's office? No When did it last happen?20+ years If all above answers are "NO", may proceed with cephalosporin use.   . Aspirin Other (See Comments)    On coumadin  . Fentanyl Dermatitis    Rash with patch   . Niacin Itching     PHYSICAL EXAM:  ECOG Performance status: 1  Vitals:   09/30/19 0953  BP: (!) 143/62    Pulse: 63  Resp: 18  Temp: (!) 97.3 F (36.3 C)  SpO2: 93%   Filed Weights   09/30/19 0953  Weight: 193 lb 3.2 oz (87.6 kg)    Physical Exam Constitutional:      Appearance: Normal appearance. She is normal weight.  Cardiovascular:     Rate and Rhythm: Normal rate and regular rhythm.     Heart sounds: Normal heart sounds.  Pulmonary:     Effort: Pulmonary effort is normal.     Breath sounds: Normal breath sounds.  Abdominal:     General: Bowel sounds are normal.     Palpations: Abdomen is soft.  Musculoskeletal:        General: Normal range of motion.  Skin:    General: Skin is warm.  Neurological:     Mental Status: She is alert and oriented to person, place, and time. Mental status is at baseline.  Psychiatric:        Mood and Affect: Mood normal.        Behavior: Behavior normal.        Thought Content: Thought content normal.        Judgment: Judgment normal.      LABORATORY DATA:  I have reviewed the labs as listed.  CBC    Component Value Date/Time   WBC 5.5 09/30/2019 0929   RBC 3.38 (L) 09/30/2019 0929   HGB 10.1 (L) 09/30/2019 0929   HGB 9.6 (L) 01/28/2019 1218   HCT 32.8 (L) 09/30/2019 0929   HCT 29.9 (L) 01/28/2019 1218   PLT 152 09/30/2019 0929   PLT 199 01/28/2019 1218  MCV 97.0 09/30/2019 0929   MCV 90 01/28/2019 1218   MCH 29.9 09/30/2019 0929   MCHC 30.8 09/30/2019 0929   RDW 15.8 (H) 09/30/2019 0929   RDW 17.1 (H) 01/28/2019 1218   LYMPHSABS 0.8 09/30/2019 0929   LYMPHSABS 0.7 01/28/2019 1218   MONOABS 0.5 09/30/2019 0929   EOSABS 0.0 09/30/2019 0929   EOSABS 0.0 01/28/2019 1218   BASOSABS 0.0 09/30/2019 0929   BASOSABS 0.0 01/28/2019 1218   CMP Latest Ref Rng & Units 09/30/2019 08/04/2019 08/02/2019  Glucose 70 - 99 mg/dL 91 99 97  BUN 8 - 23 mg/dL 50(H) 38(H) 43(H)  Creatinine 0.44 - 1.00 mg/dL 1.68(H) 1.45(H) 1.44(H)  Sodium 135 - 145 mmol/L 139 135 134(L)  Potassium 3.5 - 5.1 mmol/L 4.8 3.9 4.3  Chloride 98 - 111 mmol/L 101  97(L) 96(L)  CO2 22 - 32 mmol/L 28 28 29   Calcium 8.9 - 10.3 mg/dL 9.3 9.4 9.6  Total Protein 6.5 - 8.1 g/dL 7.7 7.9 -  Total Bilirubin 0.3 - 1.2 mg/dL 0.5 0.9 -  Alkaline Phos 38 - 126 U/L 47 52 -  AST 15 - 41 U/L 25 25 -  ALT 0 - 44 U/L 13 11 -   I personally performed a face-to-face visit.  All questions were answered to patient's stated satisfaction. Encouraged patient to call with any new concerns or questions before his next visit to the cancer center and we can certain see him sooner, if needed.     ASSESSMENT & PLAN:   Iron deficiency 1.  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 -She has been on Procrit that initially started 3 years ago, with overall stability in her hemoglobin. -She is now on Procrit injections 40,000 units every 4 weeks. -She is taking iron tablets twice daily and tolerating them well. -Labs on 09/30/2019 showed hemoglobin 10.1, platelets 152, ferritin 44, percent saturation 17. -She does not need any IV iron at this time. -She will continue the Procrit injections every 4 weeks at this time. -She will follow-up in 2 months with repeat labs  2.  Chronic kidney disease: -Serum creatinine is slightly increased at 1.68 -Continue to follow-up with nephrology.        Orders placed this encounter:  Orders Placed This Encounter  Procedures  . Lactate dehydrogenase  . CBC with Differential/Platelet  . Comprehensive metabolic panel  . Ferritin  . Iron and TIBC  . Vitamin B12  . Vitamin D 25 hydroxy  . Folate  . CBC with Differential      Francene Finders, FNP-C [place] [phone]

## 2019-09-30 NOTE — Assessment & Plan Note (Addendum)
1.  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 -She has been on Procrit that initially started 3 years ago, with overall stability in her hemoglobin. -She is now on Procrit injections 40,000 units every 4 weeks. -She is taking iron tablets twice daily and tolerating them well. -Labs on 09/30/2019 showed hemoglobin 10.1, platelets 152, ferritin 44, percent saturation 17. -She does not need any IV iron at this time. -She will continue the Procrit injections every 4 weeks at this time. -She will follow-up in 2 months with repeat labs  2.  Chronic kidney disease: -Serum creatinine is slightly increased at 1.68 -Continue to follow-up with nephrology.

## 2019-09-30 NOTE — Patient Instructions (Signed)
[name] [name] at [facility] Discharge Instructions  Follow up in 2 months with labs, injection and office visit. Continue monthly labs and injections.    Thank you for choosing [place] at [facility] to provide your oncology and hematology care.  To afford each patient quality time with our provider, please arrive at least 15 minutes before your scheduled appointment time.   If you have a lab appointment with the [facility] please come in thru the Main Entrance and check in at the main information desk.  You need to re-schedule your appointment should you arrive 10 or more minutes late.  We strive to give you quality time with our providers, and arriving late affects you and other patients whose appointments are after yours.  Also, if you no show three or more times for appointments you may be dismissed from the clinic at the providers discretion.     Again, thank you for choosing [facility].  Our hope is that these requests will decrease the amount of time that you wait before being seen by our physicians.       _____________________________________________________________  Should you have questions after your visit to [facility], please contact our office at [phone] between the hours of 8:00 a.m. and 4:30 p.m.  Voicemails left after 4:00 p.m. will not be returned until the following business day.  For prescription refill requests, have your pharmacy contact our office and allow 72 hours.    Due to Covid, you will need to wear a mask upon entering the hospital. If you do not have a mask, a mask will be given to you at the Main Entrance upon arrival. For doctor visits, patients may have 1 support person with them. For treatment visits, patients can not have anyone with them due to social distancing guidelines and our immunocompromised population.

## 2019-10-08 ENCOUNTER — Ambulatory Visit (INDEPENDENT_AMBULATORY_CARE_PROVIDER_SITE_OTHER): Payer: Medicare Other | Admitting: Family Medicine

## 2019-10-08 ENCOUNTER — Other Ambulatory Visit: Payer: Self-pay

## 2019-10-08 ENCOUNTER — Encounter: Payer: Self-pay | Admitting: Family Medicine

## 2019-10-08 VITALS — BP 127/73 | HR 68 | Resp 15 | Ht 67.0 in | Wt 189.4 lb

## 2019-10-08 DIAGNOSIS — I10 Essential (primary) hypertension: Secondary | ICD-10-CM | POA: Diagnosis not present

## 2019-10-08 DIAGNOSIS — E890 Postprocedural hypothyroidism: Secondary | ICD-10-CM | POA: Diagnosis not present

## 2019-10-08 DIAGNOSIS — E039 Hypothyroidism, unspecified: Secondary | ICD-10-CM | POA: Diagnosis not present

## 2019-10-08 DIAGNOSIS — E785 Hyperlipidemia, unspecified: Secondary | ICD-10-CM

## 2019-10-08 DIAGNOSIS — R7303 Prediabetes: Secondary | ICD-10-CM

## 2019-10-08 NOTE — Patient Instructions (Signed)
F/U in office with MD in May, call if you need me sooner  Continue to be careful not to fall  Thankful that other than arthritis , you feel well overall.  Lab order for thyroid function (TSH) is sent to the hospital for this to be drawn when you go for your other labs  Best for 2021!  Thanks for choosing [facility], we consider it a privelige to serve you.

## 2019-10-08 NOTE — Progress Notes (Signed)
Virtual Visit via Telephone Note  I connected with [name] [name] on 10/08/19 at  1:20 PM EST by telephone and verified that I am speaking with the correct person using two identifiers.  Location: Patient: home Provider: office   I discussed the limitations, risks, security and privacy concerns of performing an evaluation and management service by telephone and the availability of in person appointments. I also discussed with the patient that there may be a patient responsible charge related to this service. The patient expressed understanding and agreed to proceed.   History of Present Illness: F/U chronic problems, medication review, and refill medication when necessary. Review most recent labs and order labs which are due Review preventive health and update with necessary referrals or immunizations as indicated Doing well overall, no new compliants Denies recent fever or chills. Denies sinus pressure, nasal congestion, ear pain or sore throat. Denies chest congestion, productive cough or wheezing. Denies chest pains, palpitations and leg swelling Denies abdominal pain, nausea, vomiting,diarrhea or constipation.   Denies dysuria, frequency, hesitancy or incontinence. C/o chronic joint pain, swelling and limitation in mobility. Denies headaches, seizures, numbness, or tingling. Denies depression, anxiety or insomnia. Denies skin break down or rash.       Observations/Objective: BP 127/73   Pulse 68   Resp 15   Ht 5\' 7"  (1.702 m)   Wt 189 lb 6.4 oz (85.9 kg)   BMI 29.66 kg/m  Good communication with no confusion and intact memory. Alert and oriented x 3 No signs of respiratory distress during speech    Assessment and Plan:  Essential hypertension Controlled, no change in medication DASH diet and commitment to daily physical activity for a minimum of 30 minutes discussed and encouraged, as a part of hypertension management. The importance of attaining a healthy weight  is also discussed.  BP/Weight 10/08/2019 09/30/2019 09/24/2019 09/02/2019 08/19/2019 08/04/2019 16/09/958  Systolic BP 454 098 119 147 829 562 130  Diastolic BP 73 62 75 67 91 51 67  Wt. (Lbs) 189.4 193.2 193 - 187 182.7 194  BMI 29.66 30.26 30.23 - 29.29 28.61 30.38       Hypothyroidism Controlled, no change in medication Updated lab needed at/ before next visit.   Hyperlipidemia LDL goal <70 Hyperlipidemia:Low fat diet discussed and encouraged.   Lipid Panel  Lab Results  Component Value Date   CHOL 123 07/07/2019   HDL 48 07/07/2019   LDLCALC 63 07/07/2019   TRIG 61 07/07/2019   CHOLHDL 2.6 07/07/2019   Controlled, no change in medication      Follow Up Instructions:    I discussed the assessment and treatment plan with the patient. The patient was provided an opportunity to ask questions and all were answered. The patient agreed with the plan and demonstrated an understanding of the instructions.   The patient was advised to call back or seek an in-person evaluation if the symptoms worsen or if the condition fails to improve as anticipated.  I provided 15 minutes of non-face-to-face time during this encounter.   [name], MD

## 2019-10-11 ENCOUNTER — Encounter: Payer: Self-pay | Admitting: Family Medicine

## 2019-10-11 NOTE — Assessment & Plan Note (Signed)
Controlled, no change in medication Updated lab needed at/ before next visit.  

## 2019-10-11 NOTE — Assessment & Plan Note (Signed)
Patient educated about the importance of limiting  Carbohydrate intake , the need to commit to daily physical activity for a minimum of 30 minutes , and to commit weight loss. The fact that changes in all these areas will reduce or eliminate all together the development of diabetes is stressed.   Diabetic Labs Latest Ref Rng & Units 09/30/2019 08/04/2019 08/02/2019 07/07/2019 05/31/2019  HbA1c <5.7 % of total Hgb - - - - -  Microalbumin mg/dL - - - - -  Micro/Creat Ratio <30 mcg/mg creat - - - - -  Chol 0 - 200 mg/dL - - - 123 -  HDL >40 mg/dL - - - 48 -  Calc LDL 0 - 99 mg/dL - - - 63 -  Triglycerides <150 mg/dL - - - 61 -  Creatinine 0.44 - 1.00 mg/dL 1.68(H) 1.45(H) 1.44(H) 1.82(H) 1.48(H)   BP/Weight 10/08/2019 09/30/2019 09/24/2019 09/02/2019 08/19/2019 08/04/2019 27/02/1469  Systolic BP 929 574 734 037 096 438 381  Diastolic BP 73 62 75 67 91 51 67  Wt. (Lbs) 189.4 193.2 193 - 187 182.7 194  BMI 29.66 30.26 30.23 - 29.29 28.61 30.38   Foot/eye exam completion dates Latest Ref Rng & Units 07/17/2018 06/28/2017  Eye Exam No Retinopathy - -  Foot Form Completion - Done Done

## 2019-10-11 NOTE — Assessment & Plan Note (Signed)
Hyperlipidemia:Low fat diet discussed and encouraged.   Lipid Panel  Lab Results  Component Value Date   CHOL 123 07/07/2019   HDL 48 07/07/2019   LDLCALC 63 07/07/2019   TRIG 61 07/07/2019   CHOLHDL 2.6 07/07/2019   Controlled, no change in medication

## 2019-10-11 NOTE — Assessment & Plan Note (Signed)
Controlled, no change in medication DASH diet and commitment to daily physical activity for a minimum of 30 minutes discussed and encouraged, as a part of hypertension management. The importance of attaining a healthy weight is also discussed.  BP/Weight 10/08/2019 09/30/2019 09/24/2019 09/02/2019 08/19/2019 08/04/2019 95/11/2021  Systolic BP 343 568 616 837 290 211 155  Diastolic BP 73 62 75 67 91 51 67  Wt. (Lbs) 189.4 193.2 193 - 187 182.7 194  BMI 29.66 30.26 30.23 - 29.29 28.61 30.38

## 2019-10-13 ENCOUNTER — Other Ambulatory Visit: Payer: Self-pay | Admitting: Cardiovascular Disease

## 2019-10-14 NOTE — Telephone Encounter (Signed)
Rx(s) sent to pharmacy electronically.  

## 2019-10-15 ENCOUNTER — Other Ambulatory Visit: Payer: Self-pay

## 2019-10-15 ENCOUNTER — Ambulatory Visit (INDEPENDENT_AMBULATORY_CARE_PROVIDER_SITE_OTHER): Payer: Medicare Other | Admitting: *Deleted

## 2019-10-15 DIAGNOSIS — Z5181 Encounter for therapeutic drug level monitoring: Secondary | ICD-10-CM

## 2019-10-15 DIAGNOSIS — Z952 Presence of prosthetic heart valve: Secondary | ICD-10-CM

## 2019-10-15 LAB — POCT INR: INR: 2 (ref 2.0–3.0)

## 2019-10-15 NOTE — Patient Instructions (Signed)
Continue warfarin 2 tablets daily except 1 tablet on Tuesdays  Continue greens Recheck in 5 weeks

## 2019-10-20 ENCOUNTER — Ambulatory Visit (INDEPENDENT_AMBULATORY_CARE_PROVIDER_SITE_OTHER): Payer: Medicare Other

## 2019-10-20 DIAGNOSIS — Z95810 Presence of automatic (implantable) cardiac defibrillator: Secondary | ICD-10-CM | POA: Diagnosis not present

## 2019-10-20 DIAGNOSIS — I5032 Chronic diastolic (congestive) heart failure: Secondary | ICD-10-CM | POA: Diagnosis not present

## 2019-10-21 ENCOUNTER — Ambulatory Visit: Payer: Medicare Other | Attending: Internal Medicine

## 2019-10-21 ENCOUNTER — Other Ambulatory Visit: Payer: Self-pay

## 2019-10-21 DIAGNOSIS — Z20822 Contact with and (suspected) exposure to covid-19: Secondary | ICD-10-CM | POA: Diagnosis not present

## 2019-10-22 ENCOUNTER — Ambulatory Visit (INDEPENDENT_AMBULATORY_CARE_PROVIDER_SITE_OTHER): Payer: Medicare Other | Admitting: Family Medicine

## 2019-10-22 VITALS — BP 127/73 | Ht 67.0 in | Wt 189.0 lb

## 2019-10-22 DIAGNOSIS — J441 Chronic obstructive pulmonary disease with (acute) exacerbation: Secondary | ICD-10-CM | POA: Diagnosis not present

## 2019-10-22 DIAGNOSIS — I428 Other cardiomyopathies: Secondary | ICD-10-CM | POA: Diagnosis not present

## 2019-10-22 DIAGNOSIS — I10 Essential (primary) hypertension: Secondary | ICD-10-CM

## 2019-10-22 DIAGNOSIS — N184 Chronic kidney disease, stage 4 (severe): Secondary | ICD-10-CM | POA: Diagnosis not present

## 2019-10-22 DIAGNOSIS — D594 Other nonautoimmune hemolytic anemias: Secondary | ICD-10-CM

## 2019-10-22 LAB — NOVEL CORONAVIRUS, NAA: SARS-CoV-2, NAA: DETECTED — AB

## 2019-10-22 MED ORDER — PREDNISONE 5 MG PO TABS: ORAL_TABLET | ORAL | 0 refills | Status: DC

## 2019-10-22 MED ORDER — BENZONATATE 100 MG PO CAPS: 100.0000 mg | ORAL_CAPSULE | Freq: Two times a day (BID) | ORAL | 1 refills | Status: DC | PRN

## 2019-10-22 NOTE — Progress Notes (Signed)
Virtual Visit via Telephone Note  I connected with [name] [name] on 10/22/19 at 10:20 AM EST by telephone and verified that I am speaking with the correct person using two identifiers.  Location: Patient: home Provider: office   I discussed the limitations, risks, security and privacy concerns of performing an evaluation and management service by telephone and the availability of in person appointments. I also discussed with the patient that there may be a patient responsible charge related to this service. The patient expressed understanding and agreed to proceed.   History of Present Illness: 1 week h/o wheezing , worse at night, and also non productive cough. Denies fever, chills or body aches. Denies increased sinus pressure, drainage, ear pain or sore throat. No known direct contact with Covid  Denies chest pains, palpitations and leg swelling Denies abdominal pain, nausea, vomiting,diarrhea or constipation.   Denies dysuria, frequency, hesitancy or incontinence. C/o chronic  joint pain, swelling and limitation in mobility. Denies headaches, seizures, numbness, or tingling. Denies depression, anxiety or insomnia. Denies skin break down or rash.       Observations/Objective: BP 127/73   Ht 5\' 7"  (1.702 m)   Wt 189 lb (85.7 kg)   BMI 29.60 kg/m  Good communication with no confusion and intact memory. Alert and oriented x 3 No signs of respiratory distress during speech    Assessment and Plan:  COPD (chronic obstructive pulmonary disease) (HCC) Current flare, short course of prednisone, bedtime only, and tessalon perles, increase breathing treatments to 3 times daily eD if worsenind  Essential hypertension Controlled, no change in medication   DEGENERATIVE JOINT DISEASE, SPINE Home safety briefly discussed   Follow Up Instructions:    I discussed the assessment and treatment plan with the patient. The patient was provided an opportunity to ask questions and  all were answered. The patient agreed with the plan and demonstrated an understanding of the instructions.   The patient was advised to call back or seek an in-person evaluation if the symptoms worsen or if the condition fails to improve as anticipated.  I provided 20 minutes of non-face-to-face time during this encounter.   [name], MD

## 2019-10-22 NOTE — Progress Notes (Signed)
EPIC Encounter for ICM Monitoring  Patient Name: [name] [name] is a 86 y.o. female Date: 10/22/2019 Primary Care Physican: Fayrene Helper, MD Primary Cardiologist:Croitoru Electrophysiologist:Taylor Bi-V Pacing:99.9% 11/9/2020Weight:182lbs   Spoke with patient and she is asymptomatic.  She said she found out today she tested positive for COVID.  She was around some people last week that tested positive for the virus.         She is currently not having any symptoms.  Advised to call PCP office tomorrow to inform them she has tested positive for the virus.   Optivol thoracic impedancenormal.  Prescribed:  Furosemide40 mg 1 tablet daily.   Metolazaone 2.5 mg Take 1tablet twicea week (Wednesday and Saturdays).   Potassium 20 mEq take 1 tablet daily.  Labs: 09/30/2019 Creatinine 1.68, BUN 50, Potassium 4.8, Sodium 139, GFR 27-32 08/04/2019 Creatinine 1.45, BUN 38, Potassium 3.9, Sodium 135, GFR 33-38 08/02/2019 Creatinine 1.44, BUN 43, Potassium 4.3, Sodium 134, GFR 33-38 07/07/2019 Creatinine 1.82, BUN 54, Potassium 4.4, Sodium 132, GFR 25-29 05/31/2019 Creatinine1.48, BUN45, Potassium4.4, Sodium132, GFR32-37 05/30/2019 Creatinine1.54, BUN47, Potassium4.2, Sodium131, GFR30-35  05/29/2019 Creatinine1.86, BUN50, Potassium4.6, Sodium129, GBM21-11 A complete set of results can be found in Results Review.  Recommendations:No changes and encouraged to call if experiencing any fluid symptoms.  Follow-up plan: ICM clinic phone appointment on3/09/2019.91 day device clinic remote transmission 11/14/2019.   Office appt 11/10/2019 with Dr. Sallyanne Kuster.    Copy of ICM check sent to Dr. Lovena Le.   3 month ICM trend: 10/19/2019    1 Year ICM trend:       [name], RN 10/22/2019 11:26 AM

## 2019-10-22 NOTE — Patient Instructions (Addendum)
F/u phone visit with MD in 4 weeks, call if you need me sooner  Medications prescribed are tessalon perles and 5 days of prednisone at bedtime only.  Please use your breathing machine 3 times daily for the next 5 days, then go back to twice daily and youre your albuterol inhaler at bedtime  If you worsen, please go to the ED

## 2019-10-23 ENCOUNTER — Telehealth: Payer: Self-pay | Admitting: Physician Assistant

## 2019-10-23 ENCOUNTER — Telehealth: Payer: Self-pay | Admitting: *Deleted

## 2019-10-23 NOTE — Telephone Encounter (Signed)
Pt wanted to let Dr [name] know that she tested positive for covid got her results yesterday afternoon. Also wanted to know if she should still take the medications due to testing positive or what was recommended

## 2019-10-23 NOTE — Telephone Encounter (Signed)
[name]    My name is Montey Hora and I am a Librarian, academic at [employer]. I am reaching out to see how you are feeling since coming to [facility] to be tested for COVID-19 infection. I am hopeful this message finds you feeling better.    I would also like to inform you that we are attempting to contact some of our patients who have recently been diagnosed with COVID-19 with active mild to moderate symptoms regarding a potential treatment. This treatment has been shown to reduce the risk of hospitalization and decrease the risk for severe symptoms related to COVID-19 in a select group of patients with risk factors / medical conditions we believe put people at a higher risk for this infection.    The Food and Drug Administration (FDA) has given emergency use of a new medication to treat those with mild to moderate symptoms who have risk factors that have been identified for severe symptoms related to COVID-19. This new treatment is a monoclonal antibody - the way it works its that it attaches like a magnet to the SARS-CoV2 virus (the virus that causes COVID-19) and stops it from infecting more cells in your body. It does not kill the virus but it prevents it from spreading throughout your body with the hope that it will decrease your symptoms after it is given.    This new medication is an intravenous (IV) infusion that is given over one hour in our outpatient infusion clinic here in [place]. We will require you to stay roughly 60 minutes after the infusion to ensure you are tolerating it and watch for any allergic reaction to the medication. More information will also be given to you at the time of your appointment.    The side effects that have been seen with this treatment:  2-4% of recipients experience nausea, vomiting, diarrhea, dizziness, headaches, itching  There have been no serious infusion related reactions  Of the 850 patients who received the infusion one did have an  allergic response that resolved once the infusion was stopped - this is why we monitor all of our patients closely for an hour after the infusion.    If you are interested in our assistance to determine if you are a good candidate please respond to this MyChart message so that a team member may call you to discuss further. Or you may call [phone] with questions.    For your reference:  [email or web address]  Regards,  Montey Hora, PA - C

## 2019-10-23 NOTE — Telephone Encounter (Signed)
Yes she should take them

## 2019-10-23 NOTE — Telephone Encounter (Signed)
Advised patient to continue medications as directed with verbal understanding.

## 2019-10-27 ENCOUNTER — Telehealth: Payer: Self-pay

## 2019-10-27 ENCOUNTER — Other Ambulatory Visit: Payer: Self-pay

## 2019-10-27 ENCOUNTER — Telehealth: Payer: Self-pay | Admitting: *Deleted

## 2019-10-27 ENCOUNTER — Encounter: Payer: Self-pay | Admitting: Family Medicine

## 2019-10-27 MED ORDER — SYNTHROID 75 MCG PO TABS: ORAL_TABLET | ORAL | 1 refills | Status: DC

## 2019-10-27 MED ORDER — WARFARIN SODIUM 5 MG PO TABS: ORAL_TABLET | ORAL | 3 refills | Status: DC

## 2019-10-27 NOTE — Telephone Encounter (Signed)
PT LVM that she needs her Thyroid pills called in

## 2019-10-27 NOTE — Telephone Encounter (Signed)
Thyroid meds called into [facility]

## 2019-10-27 NOTE — Telephone Encounter (Signed)
Needs Jantoven Rx sent to [facility].  Done.

## 2019-10-27 NOTE — Assessment & Plan Note (Signed)
Controlled, no change in medication  

## 2019-10-27 NOTE — Assessment & Plan Note (Signed)
Home safety briefly discussed

## 2019-10-27 NOTE — Telephone Encounter (Signed)
Patient called requesting to speak with Edrick Oh. RN   [phone]

## 2019-10-27 NOTE — Assessment & Plan Note (Signed)
Current flare, short course of prednisone, bedtime only, and tessalon perles, increase breathing treatments to 3 times daily eD if worsenind

## 2019-10-28 ENCOUNTER — Ambulatory Visit (HOSPITAL_COMMUNITY): Payer: Medicare Other

## 2019-10-28 ENCOUNTER — Other Ambulatory Visit (HOSPITAL_COMMUNITY): Payer: Medicare Other

## 2019-10-29 ENCOUNTER — Telehealth: Payer: Self-pay | Admitting: *Deleted

## 2019-10-29 NOTE — Telephone Encounter (Signed)
Requesting information regarding the vaccine wait list. Provided information. Offered to help place her on the wait list however her niece wants to schedule her.

## 2019-11-03 ENCOUNTER — Telehealth: Payer: Self-pay | Admitting: *Deleted

## 2019-11-03 ENCOUNTER — Encounter: Payer: Medicare Other | Admitting: Cardiovascular Disease

## 2019-11-03 NOTE — Telephone Encounter (Signed)
Pt calling to question quarantine period. Tested covid positive 10/20/2018. Advised if fever free, no respiratory symptoms pt may end quarantine . Pt verbalizes understanding.

## 2019-11-04 ENCOUNTER — Other Ambulatory Visit (HOSPITAL_COMMUNITY): Payer: Self-pay | Admitting: *Deleted

## 2019-11-04 DIAGNOSIS — D649 Anemia, unspecified: Secondary | ICD-10-CM

## 2019-11-04 DIAGNOSIS — E611 Iron deficiency: Secondary | ICD-10-CM

## 2019-11-04 DIAGNOSIS — N184 Chronic kidney disease, stage 4 (severe): Secondary | ICD-10-CM

## 2019-11-05 ENCOUNTER — Telehealth: Payer: Self-pay | Admitting: Cardiovascular Disease

## 2019-11-05 ENCOUNTER — Inpatient Hospital Stay (HOSPITAL_COMMUNITY): Payer: Medicare Other

## 2019-11-05 ENCOUNTER — Inpatient Hospital Stay (HOSPITAL_COMMUNITY): Payer: Medicare Other | Attending: Hematology

## 2019-11-05 ENCOUNTER — Other Ambulatory Visit: Payer: Self-pay

## 2019-11-05 VITALS — BP 152/63 | HR 58 | Temp 96.9°F | Resp 18

## 2019-11-05 DIAGNOSIS — N184 Chronic kidney disease, stage 4 (severe): Secondary | ICD-10-CM

## 2019-11-05 DIAGNOSIS — D649 Anemia, unspecified: Secondary | ICD-10-CM

## 2019-11-05 DIAGNOSIS — N189 Chronic kidney disease, unspecified: Secondary | ICD-10-CM | POA: Diagnosis not present

## 2019-11-05 DIAGNOSIS — D631 Anemia in chronic kidney disease: Secondary | ICD-10-CM | POA: Diagnosis not present

## 2019-11-05 DIAGNOSIS — E611 Iron deficiency: Secondary | ICD-10-CM

## 2019-11-05 LAB — CBC
HCT: 29.8 % — ABNORMAL LOW (ref 36.0–46.0)
Hemoglobin: 9.5 g/dL — ABNORMAL LOW (ref 12.0–15.0)
MCH: 30.3 pg (ref 26.0–34.0)
MCHC: 31.9 g/dL (ref 30.0–36.0)
MCV: 94.9 fL (ref 80.0–100.0)
Platelets: 192 10*3/uL (ref 150–400)
RBC: 3.14 MIL/uL — ABNORMAL LOW (ref 3.87–5.11)
RDW: 15.7 % — ABNORMAL HIGH (ref 11.5–15.5)
WBC: 4.8 10*3/uL (ref 4.0–10.5)
nRBC: 0 % (ref 0.0–0.2)

## 2019-11-05 MED ORDER — EPOETIN ALFA-EPBX 40000 UNIT/ML IJ SOLN
INTRAMUSCULAR | Status: AC
  Filled 2019-11-05: qty 1

## 2019-11-05 MED ORDER — EPOETIN ALFA-EPBX 40000 UNIT/ML IJ SOLN
40000.0000 [IU] | Freq: Once | INTRAMUSCULAR | Status: AC
  Administered 2019-11-05: 40000 [IU] via SUBCUTANEOUS

## 2019-11-05 NOTE — Telephone Encounter (Signed)
Spoke with patient. [name] [name]. approved patient's niece to accompany her to the appointment on 11/10/19. Patient very appreciative.

## 2019-11-05 NOTE — Patient Instructions (Signed)
[place] at [facility]  Discharge Instructions:  Retacrit injection received today. Your hemoglobin was 9.5. _______________________________________________________________  Thank you for choosing [place] at [facility] to provide your oncology and hematology care.  To afford each patient quality time with our providers, please arrive at least 15 minutes before your scheduled appointment.  You need to re-schedule your appointment if you arrive 10 or more minutes late.  We strive to give you quality time with our providers, and arriving late affects you and other patients whose appointments are after yours.  Also, if you no show three or more times for appointments you may be dismissed from the clinic.  Again, thank you for choosing [facility] [place] hope is that these requests will allow you access to exceptional care and in a timely manner. _______________________________________________________________  If you have questions after your visit, please contact our office at [phone] between the hours of 8:30 a.m. and 5:00 p.m. Voicemails left after 4:30 p.m. will not be returned until the following business day. _______________________________________________________________  For prescription refill requests, have your pharmacy contact our office. _______________________________________________________________  Recommendations made by the consultant and any test results will be sent to your referring physician. _______________________________________________________________

## 2019-11-05 NOTE — Telephone Encounter (Signed)
Patient calling to request her niece come with her to her appointment 2/15. She says her niece has to help her with her oxygen tank and cane.

## 2019-11-05 NOTE — Progress Notes (Signed)
[name] [name] presents today for Retacrit injection. Hemoglobin reviewed prior to administration. VSS. Injection tolerated without incident or complaint. See MAR for details. Patient discharged in satisfactory condition with follow up instructions.

## 2019-11-10 ENCOUNTER — Encounter: Payer: Medicare Other | Admitting: Cardiovascular Disease

## 2019-11-10 ENCOUNTER — Other Ambulatory Visit: Payer: Self-pay | Admitting: Family Medicine

## 2019-11-10 ENCOUNTER — Other Ambulatory Visit: Payer: Self-pay | Admitting: Internal Medicine

## 2019-11-13 LAB — CUP PACEART REMOTE DEVICE CHECK
Battery Remaining Longevity: 48 mo
Battery Voltage: 2.98 V
Brady Statistic AP VP Percent: 0 %
Brady Statistic AP VS Percent: 0 %
Brady Statistic AS VP Percent: 99.8 %
Brady Statistic AS VS Percent: 0.2 %
Brady Statistic RA Percent Paced: 0 %
Brady Statistic RV Percent Paced: 99.8 %
Date Time Interrogation Session: 20210218000539
Implantable Lead Implant Date: 20040521
Implantable Lead Implant Date: 20071218
Implantable Lead Implant Date: 20160516
Implantable Lead Location: 753858
Implantable Lead Location: 753859
Implantable Lead Location: 753860
Implantable Lead Model: 5076
Implantable Lead Model: 6947
Implantable Pulse Generator Implant Date: 20200506
Lead Channel Impedance Value: 285 Ohm
Lead Channel Impedance Value: 304 Ohm
Lead Channel Impedance Value: 323 Ohm
Lead Channel Impedance Value: 323 Ohm
Lead Channel Impedance Value: 342 Ohm
Lead Channel Impedance Value: 361 Ohm
Lead Channel Impedance Value: 361 Ohm
Lead Channel Impedance Value: 437 Ohm
Lead Channel Impedance Value: 551 Ohm
Lead Channel Impedance Value: 551 Ohm
Lead Channel Impedance Value: 589 Ohm
Lead Channel Impedance Value: 665 Ohm
Lead Channel Impedance Value: 665 Ohm
Lead Channel Impedance Value: 684 Ohm
Lead Channel Pacing Threshold Amplitude: 1.75 V
Lead Channel Pacing Threshold Amplitude: 4.25 V
Lead Channel Pacing Threshold Pulse Width: 0.4 ms
Lead Channel Pacing Threshold Pulse Width: 1 ms
Lead Channel Sensing Intrinsic Amplitude: 8.25 mV
Lead Channel Sensing Intrinsic Amplitude: 8.25 mV
Lead Channel Setting Pacing Amplitude: 3 V
Lead Channel Setting Pacing Amplitude: 4 V
Lead Channel Setting Pacing Pulse Width: 0.8 ms
Lead Channel Setting Pacing Pulse Width: 1 ms
Lead Channel Setting Sensing Sensitivity: 5.6 mV

## 2019-11-13 NOTE — Progress Notes (Signed)
PPM Remote  

## 2019-11-14 ENCOUNTER — Ambulatory Visit (INDEPENDENT_AMBULATORY_CARE_PROVIDER_SITE_OTHER): Payer: Medicare Other | Admitting: *Deleted

## 2019-11-14 DIAGNOSIS — I442 Atrioventricular block, complete: Secondary | ICD-10-CM | POA: Diagnosis not present

## 2019-11-15 ENCOUNTER — Other Ambulatory Visit: Payer: Self-pay | Admitting: Family Medicine

## 2019-11-17 ENCOUNTER — Other Ambulatory Visit: Payer: Self-pay

## 2019-11-17 ENCOUNTER — Encounter (HOSPITAL_COMMUNITY): Payer: Self-pay | Admitting: Emergency Medicine

## 2019-11-17 ENCOUNTER — Emergency Department (HOSPITAL_COMMUNITY): Payer: Medicare Other

## 2019-11-17 ENCOUNTER — Emergency Department (HOSPITAL_COMMUNITY)
Admission: EM | Admit: 2019-11-17 | Discharge: 2019-11-17 | Disposition: A | Discharge status: Home / Self Care | Payer: Medicare Other | Attending: Emergency Medicine | Admitting: Emergency Medicine

## 2019-11-17 ENCOUNTER — Encounter: Payer: Self-pay | Admitting: Family Medicine

## 2019-11-17 ENCOUNTER — Ambulatory Visit: Payer: Medicare Other | Attending: Internal Medicine

## 2019-11-17 ENCOUNTER — Ambulatory Visit (INDEPENDENT_AMBULATORY_CARE_PROVIDER_SITE_OTHER): Payer: Medicare Other | Admitting: Family Medicine

## 2019-11-17 VITALS — BP 149/57 | Ht 67.0 in | Wt 184.0 lb

## 2019-11-17 DIAGNOSIS — Z79899 Other long term (current) drug therapy: Secondary | ICD-10-CM | POA: Insufficient documentation

## 2019-11-17 DIAGNOSIS — U071 COVID-19: Secondary | ICD-10-CM | POA: Diagnosis not present

## 2019-11-17 DIAGNOSIS — I130 Hypertensive heart and chronic kidney disease with heart failure and stage 1 through stage 4 chronic kidney disease, or unspecified chronic kidney disease: Secondary | ICD-10-CM | POA: Insufficient documentation

## 2019-11-17 DIAGNOSIS — Y939 Activity, unspecified: Secondary | ICD-10-CM | POA: Insufficient documentation

## 2019-11-17 DIAGNOSIS — Z95810 Presence of automatic (implantable) cardiac defibrillator: Secondary | ICD-10-CM | POA: Diagnosis not present

## 2019-11-17 DIAGNOSIS — J449 Chronic obstructive pulmonary disease, unspecified: Secondary | ICD-10-CM | POA: Insufficient documentation

## 2019-11-17 DIAGNOSIS — R7303 Prediabetes: Secondary | ICD-10-CM | POA: Diagnosis not present

## 2019-11-17 DIAGNOSIS — Z87891 Personal history of nicotine dependence: Secondary | ICD-10-CM | POA: Insufficient documentation

## 2019-11-17 DIAGNOSIS — M7061 Trochanteric bursitis, right hip: Secondary | ICD-10-CM

## 2019-11-17 DIAGNOSIS — Y929 Unspecified place or not applicable: Secondary | ICD-10-CM | POA: Insufficient documentation

## 2019-11-17 DIAGNOSIS — E1122 Type 2 diabetes mellitus with diabetic chronic kidney disease: Secondary | ICD-10-CM | POA: Insufficient documentation

## 2019-11-17 DIAGNOSIS — Z20822 Contact with and (suspected) exposure to covid-19: Secondary | ICD-10-CM

## 2019-11-17 DIAGNOSIS — Y999 Unspecified external cause status: Secondary | ICD-10-CM | POA: Diagnosis not present

## 2019-11-17 DIAGNOSIS — S99921A Unspecified injury of right foot, initial encounter: Secondary | ICD-10-CM | POA: Diagnosis not present

## 2019-11-17 DIAGNOSIS — I5032 Chronic diastolic (congestive) heart failure: Secondary | ICD-10-CM | POA: Insufficient documentation

## 2019-11-17 DIAGNOSIS — I10 Essential (primary) hypertension: Secondary | ICD-10-CM | POA: Diagnosis not present

## 2019-11-17 DIAGNOSIS — W458XXA Other foreign body or object entering through skin, initial encounter: Secondary | ICD-10-CM | POA: Insufficient documentation

## 2019-11-17 DIAGNOSIS — S91331A Puncture wound without foreign body, right foot, initial encounter: Secondary | ICD-10-CM | POA: Insufficient documentation

## 2019-11-17 DIAGNOSIS — T148XXA Other injury of unspecified body region, initial encounter: Secondary | ICD-10-CM

## 2019-11-17 DIAGNOSIS — E890 Postprocedural hypothyroidism: Secondary | ICD-10-CM | POA: Diagnosis not present

## 2019-11-17 DIAGNOSIS — N184 Chronic kidney disease, stage 4 (severe): Secondary | ICD-10-CM | POA: Insufficient documentation

## 2019-11-17 DIAGNOSIS — E039 Hypothyroidism, unspecified: Secondary | ICD-10-CM | POA: Insufficient documentation

## 2019-11-17 DIAGNOSIS — E785 Hyperlipidemia, unspecified: Secondary | ICD-10-CM

## 2019-11-17 DIAGNOSIS — Z23 Encounter for immunization: Secondary | ICD-10-CM | POA: Insufficient documentation

## 2019-11-17 DIAGNOSIS — Z09 Encounter for follow-up examination after completed treatment for conditions other than malignant neoplasm: Secondary | ICD-10-CM

## 2019-11-17 LAB — CBG MONITORING, ED: Glucose-Capillary: 121 mg/dL — ABNORMAL HIGH (ref 70–99)

## 2019-11-17 MED ORDER — TETANUS-DIPHTH-ACELL PERTUSSIS 5-2.5-18.5 LF-MCG/0.5 IM SUSP
0.5000 mL | Freq: Once | INTRAMUSCULAR | Status: AC
  Administered 2019-11-17: 0.5 mL via INTRAMUSCULAR
  Filled 2019-11-17: qty 0.5

## 2019-11-17 MED ORDER — CIPROFLOXACIN HCL 250 MG PO TABS: 500.0000 mg | ORAL_TABLET | Freq: Two times a day (BID) | ORAL | 0 refills | Status: DC

## 2019-11-17 NOTE — ED Provider Notes (Signed)
[facility] Provider Note   CSN: [id number] Arrival date & time: 11/17/19  1103     History Chief Complaint  Patient presents with  . Foot Injury    [name] [name] is a 86 y.o. female.  Patient here with right foot pain.  Believes that she is got stuck with a needle in her foot last week about Wednesday or Thursday.  Today is Monday.  States she noticed a needle several days ago and has having pain to the bottom of her right foot.  She believes she put she may have stepped on a needle last week when she felt it coming through the bottom of her shoe.  She is uncertain of her last tetanus shot.  She did have Covid 3 weeks ago but states she is not having any symptoms.  Patient has a history of complete heart block, COPD, diabetes, aortic valve replacement on Coumadin.  She denies any chest pain, shortness of breath, abdominal pain, nausea or vomiting.  She is due for Coumadin check in 2 days.  There has been no significant bleeding from the wound  States she did not come until today due to bad weather not able to travel.  The history is provided by the patient.  Foot Injury Associated symptoms: no fatigue and no fever        Past Medical History:  Diagnosis Date  . Adenomatous polyp of colon 12/16/2002   Dr. [name]/[facility]  . Allergy   . Cataract   . CHB (complete heart block) ([place]) 10/07/2014      . CHF (congestive heart failure) ([place])    a.  EF previously reduced to 25-30% b. EF improved to 60-65% by echo in 10/2017.   [place] Kitchen Chronic kidney disease, stage I 2006  . Constipation       . COPD (chronic obstructive pulmonary disease) ([place])       . DJD (degenerative joint disease) of lumbar spine   . DM (diabetes mellitus) ([place]) 2006  . Esophageal reflux   . Glaucoma   . Gout       . H/O heart valve replacement with mechanical valve    a. s/p mechanical AVR in 2001 and mechanical MVR in 2004.  [place] Kitchen H/O wisdom tooth  extraction   . Hyperlipemia   . IDA (iron deficiency anemia)    Parenteral iron/[name]  . Insomnia       . Non-ischemic cardiomyopathy ([place])    a. s/p MDT CRTD  . Obesity, unspecified   . Other and unspecified hyperlipidemia   . Oxygen deficiency 2014   nocturnal;  . Spondylolisthesis   . Spondylosis   . Thyroid cancer ([place])    remote thyroidectomy, no recurrence, pt denies in 2016 thyroid cancer  . Type 2 diabetes mellitus with hyperglycemia ([place]) 07/15/2010  . Unspecified hypothyroidism       . 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 Vertigo         Patient Active Problem List   Diagnosis Date Noted  . Dyspnea 05/30/2019  . Anemia 05/30/2019  . Gout   . Glaucoma   . GERD (gastroesophageal reflux disease)   . Physical debility 04/28/2019  . Hyponatremia 09/27/2018  . H/O mitral valve replacement with mechanical valve 08/02/2018  . Chronic right hip pain 04/16/2018  . Trochanteric bursitis, right hip 03/07/2017  . Persistent atrial fibrillation ([place])   . Nocturnal hypoxia 10/08/2016  . Dependence on  nocturnal oxygen therapy 10/08/2016  . Hematuria 03/20/2016  . S/P ICD (internal cardiac defibrillator) procedure 02/08/2015  . Presence of cardiac pacemaker 02/08/2015  . CHB (complete heart block) ([place]) 10/07/2014  . Fatigue 07/20/2014  . Absolute anemia 06/24/2014  . Osteopenia 02/10/2014  . Encounter for therapeutic drug monitoring 10/29/2013  . Spinal stenosis of lumbar region 02/19/2013  . OA (osteoarthritis) of knee 02/19/2013  . Chronic pain of right knee 01/02/2013  . Hemolytic anemia ([place]) 09/24/2012  . H/O adenomatous polyp of colon 05/24/2012  . High risk medication use 05/24/2012  . COPD (chronic obstructive pulmonary disease) ([place]) 05/19/2012  . Acute on chronic diastolic CHF (congestive heart failure) ([place]) 05/19/2012  . Chronic anticoagulation, on coumadin for Mechanical AVR and MVR 04/01/2012  . Cardiorenal syndrome  with renal failure 03/22/2012  . Hx of aortic valve replacement, mechanical 03/22/2012  . S/P MVR (mitral valve replacement) 03/22/2012  . Biventricular automatic implantable cardioverter defibrillator in situ 03/22/2012  . CKD (chronic kidney disease), stage IV ([place]) 03/22/2012  . Warfarin-induced coagulopathy ([place]) 03/21/2012  . Papilloma of breast 03/06/2012  . Iron deficiency 10/04/2011  . Allergic rhinitis 02/14/2011  . Hypothyroidism 07/15/2010  . Mitral valve disease 07/15/2010  . Sinus node dysfunction ([place]) 07/15/2010  . Nausea without vomiting 06/07/2010  . HIP PAIN, RIGHT 04/23/2009  . VARICOSE VEINS LOWER EXTREMITIES W/OTH COMPS 02/09/2009  . Calculus of gallbladder with chronic cholecystitis without obstruction 02/09/2009  . Prediabetes 06/15/2008  . Hypothyroidism, postsurgical 02/24/2008  . Hyperlipidemia LDL goal <70 02/24/2008  . Essential hypertension 02/24/2008  . Non-ischemic cardiomyopathy with ICD 02/24/2008  . GERD 02/24/2008  . DEGENERATIVE JOINT DISEASE, SPINE 02/24/2008    Past Surgical History:  Procedure Laterality Date  . AORTIC AND MITRAL VALVE REPLACEMENT  2001  . BI-VENTRICULAR IMPLANTABLE CARDIOVERTER DEFIBRILLATOR UPGRADE N/A 01/18/2015   Procedure: BI-VENTRICULAR IMPLANTABLE CARDIOVERTER DEFIBRILLATOR UPGRADE;  Surgeon: Evans Lance, MD;  Location: [facility] CATH LAB;  Service: Cardiovascular;  Laterality: N/A;  . BIV ICD GENERATOR CHANGEOUT N/A 01/30/2019   Procedure: BIV ICD GENERATOR CHANGEOUT;  Surgeon: Evans Lance, MD;  Location: [facility] CV LAB;  Service: Cardiovascular;  Laterality: N/A;  . CARDIAC CATHETERIZATION    . CARDIAC VALVE REPLACEMENT    . CARDIOVERSION N/A 11/13/2016   Procedure: CARDIOVERSION;  Surgeon: Sanda Klein, MD;  Location: [facility] ENDOSCOPY;  Service: Cardiovascular;  Laterality: N/A;  . COLONOSCOPY  06/12/2007   Rourk- Normal rectum/Normal colon  . COLONOSCOPY  12/16/02   small hemorrhoids  . COLONOSCOPY  06/20/2012    Procedure: COLONOSCOPY;  Surgeon: Daneil Dolin, MD;  Location: [facility] ENDO SUITE;  Service: Endoscopy;  Laterality: N/A;  10:45  . defibrillator implanted 2006    . DOPPLER ECHOCARDIOGRAPHY  2012  . DOPPLER ECHOCARDIOGRAPHY  05,06,07,08,09,2011  . ESOPHAGOGASTRODUODENOSCOPY  06/12/2007   Rourk- normal esophagus, small hiatal hernia, otherwise normal stomach, D1, D2  . EYE SURGERY Right 2005  . EYE SURGERY Left 2006  . ICD LEAD REMOVAL Left 02/08/2015   Procedure: ICD LEAD REMOVAL;  Surgeon: Evans Lance, MD;  Location: [facility] OR;  Service: Cardiovascular;  Laterality: Left;  "Will plan extraction and insertion of a BiV PM"  **Dr. Roxan Hockey backing up case**  . IMPLANTABLE CARDIOVERTER DEFIBRILLATOR (ICD) GENERATOR CHANGE Left 02/08/2015   Procedure: ICD GENERATOR CHANGE;  Surgeon: Evans Lance, MD;  Location: [facility];  Service: Cardiovascular;  Laterality: Left;  . INSERT / REPLACE / REMOVE PACEMAKER    . PACEMAKER INSERTION  May  2016  . pacemaker placed  2004  . right breast cyst removed     benign   . right cataract removed     2005  . TEE WITHOUT CARDIOVERSION N/A 11/13/2016   Procedure: TRANSESOPHAGEAL ECHOCARDIOGRAM (TEE);  Surgeon: Sanda Klein, MD;  Location: [facility] ENDOSCOPY;  Service: Cardiovascular;  Laterality: N/A;  . THYROIDECTOMY       OB History    Gravida      Para      Term      Preterm      AB      Living  0     SAB      TAB      Ectopic      Multiple      Live Births              Family History  Problem Relation Age of Onset  . Pancreatic cancer Mother   . Heart disease Father   . Heart disease Sister   . Heart attack Sister   . Heart disease Brother   . Diabetes Brother   . Heart disease Brother   . Diabetes Brother   . Hypertension Brother   . Lung cancer Brother     Social History   Tobacco Use  . Smoking status: Former Smoker    Packs/day: 0.75    Years: 40.00    Pack years: 30.00    Types: Cigarettes    Quit date: 03/08/1987     Years since quitting: 32.7  . Smokeless tobacco: Never Used  Substance Use Topics  . Alcohol use: No    Alcohol/week: 0.0 standard drinks  . Drug use: No    Home Medications Prior to Admission medications   Medication Sig Start Date End Date Taking? Authorizing Provider  acetaminophen (TYLENOL) 500 MG tablet Take 500 mg by mouth every 6 (six) hours as needed (pain).    [provider]  albuterol (PROVENTIL) (2.5 MG/3ML) 0.083% nebulizer solution 90INHALE 1 VIAL VIA NEBULIZER THREE TIMES DAILY. 09/02/19   Fayrene Helper, MD  albuterol (VENTOLIN HFA) 108 (90 Base) MCG/ACT inhaler INHALE 2 PUFFS INTO THE LUNGS EVERY 6 HOURS AS NEEDED FOR WHEEZING OR SHORTNESS OF BREATH 08/25/19   Fayrene Helper, MD  allopurinol (ZYLOPRIM) 100 MG tablet TAKE 1 TABLET(100 MG) BY MOUTH DAILY Patient taking differently: Take 100 mg by mouth daily.  06/11/19   Fayrene Helper, MD  benzonatate (TESSALON) 100 MG capsule Take 1 capsule (100 mg total) by mouth 2 (two) times daily as needed for cough. 10/22/19   Fayrene Helper, MD  BREO ELLIPTA 100-25 MCG/INH AEPB 1 TAKE 1 INHALATION BY MOUTH ONCE DAILY 08/25/19   Fayrene Helper, MD  cholecalciferol (VITAMIN D3) 25 MCG (1000 UT) tablet Take 1,000 Units by mouth daily.    [provider]  docusate sodium (COLACE) 100 MG capsule Take 300 mg by mouth at bedtime.     [provider]  fluticasone (FLONASE) 50 MCG/ACT nasal spray SHAKE LIQUID AND USE 2 SPRAYS IN EACH NOSTRIL DAILY Patient taking differently: Place 2 sprays into both nostrils daily.  07/11/19   Fayrene Helper, MD  folic acid (FOLVITE) 1 MG tablet TAKE 1 TABLET(1 MG) BY MOUTH DAILY 11/10/19   Fayrene Helper, MD  furosemide (LASIX) 40 MG tablet Take 1 tablet (40 mg total) by mouth daily. 08/19/19   Fayrene Helper, MD  gabapentin (NEURONTIN) 300 MG capsule Take 1 capsule (300 mg  total) by mouth 2 (two) times daily. 05/31/19   Barton Dubois, MD    hydrALAZINE (APRESOLINE) 25 MG tablet Take 1 tablet (25 mg total) by mouth 2 (two) times daily. 05/19/19   Croitoru, Mihai, MD  HYDROcodone-acetaminophen (NORCO) 7.5-325 MG tablet Take 1 tablet by mouth every 6 (six) hours as needed for moderate pain (Must last 30 days.). 08/19/19   Sanjuana Kava, MD  isosorbide mononitrate (IMDUR) 30 MG 24 hr tablet Take 1 tablet (30 mg total) by mouth daily. 05/23/19 09/30/19  Croitoru, Mihai, MD  Liniments (SALONPAS PAIN RELIEF PATCH EX) Apply 1 patch topically daily as needed (pain).    [provider]  meclizine (ANTIVERT) 12.5 MG tablet Take 1 tablet (12.5 mg total) by mouth 3 (three) times daily as needed for dizziness. 09/02/19   Fayrene Helper, MD  metolazone (ZAROXOLYN) 2.5 MG tablet TAKE 1 TABLET TWICE A WEEK ON [facility] AND SATURDAYS 10/14/19   Croitoru, Mihai, MD  metoprolol succinate (TOPROL-XL) 100 MG 24 hr tablet TAKE 1 TABLET BY MOUTH EVERY DAY WITH FOOD OR MILK 11/17/19   Fayrene Helper, MD  Multiple Vitamin (MULTIVITAMIN) LIQD Take 5 mLs by mouth daily.    [provider]  nitroGLYCERIN (NITROSTAT) 0.4 MG SL tablet Place 1 tablet (0.4 mg total) under the tongue every 5 (five) minutes as needed for chest pain. 08/03/19   Rolland Porter, MD  ondansetron (ZOFRAN) 4 MG tablet Take 1 tablet (4 mg total) by mouth daily as needed for nausea or vomiting. 08/19/19   Fayrene Helper, MD  Polyethyl [name] (SYSTANE) 0.4-0.3 % GEL ophthalmic gel Place 1 application into the left eye at bedtime.    [provider]  potassium chloride SA (KLOR-CON) 20 MEQ tablet Take 1 tablet (20 mEq total) by mouth daily. 08/01/19   Fayrene Helper, MD  pravastatin (PRAVACHOL) 40 MG tablet TAKE 1 TABLET BY MOUTH EVERY EVENING 11/10/19   Fayrene Helper, MD  predniSONE (DELTASONE) 5 MG tablet Take one tabletby mouth once daily for 5 days 10/22/19   Fayrene Helper, MD  SYNTHROID 75 MCG tablet TAKE 1 TABLET BY MOUTH DAILY  10/27/19   Fayrene Helper, MD  warfarin (JANTOVEN) 5 MG tablet TAKE 2 TABLETS EVERY DAY EXCEPT 1 TABLET ON SATURDAY 11/10/19   Evans Lance, MD    Allergies    Penicillins, Aspirin, Fentanyl, and Niacin  Review of Systems   Review of Systems  Constitutional: Negative for activity change, appetite change, fatigue and fever.  HENT: Negative for congestion and rhinorrhea.   Eyes: Negative for visual disturbance.  Respiratory: Negative for cough, chest tightness and shortness of breath.   Cardiovascular: Negative for chest pain.  Gastrointestinal: Negative for abdominal pain, nausea and vomiting.  Genitourinary: Negative for dysuria and hematuria.  Musculoskeletal: Negative for arthralgias and myalgias.  Skin: Positive for wound. Negative for rash.  Neurological: Negative for dizziness, weakness and headaches.    Physical Exam Updated Vital Signs BP (!) 149/57 (BP Location: Right Arm)   Pulse 60   Temp 98.1 F (36.7 C) (Oral)   Resp 18   Ht 5\' 7"  (1.702 m)   Wt 83.5 kg   SpO2 95%   BMI 28.82 kg/m   Physical Exam Vitals and nursing note reviewed.  Constitutional:      General: She is not in acute distress.    Appearance: She is well-developed.  HENT:     Head: Normocephalic and atraumatic.  Mouth/Throat:     Pharynx: No oropharyngeal exudate.  Eyes:     Conjunctiva/sclera: Conjunctivae normal.     Pupils: Pupils are equal, round, and reactive to light.  Neck:     Comments: No meningismus. Cardiovascular:     Rate and Rhythm: Normal rate and regular rhythm.     Heart sounds: Murmur present.  Pulmonary:     Effort: Pulmonary effort is normal. No respiratory distress.     Breath sounds: Normal breath sounds.  Abdominal:     Palpations: Abdomen is soft.     Tenderness: There is no abdominal tenderness. There is no guarding or rebound.  Musculoskeletal:        General: No tenderness. Normal range of motion.     Cervical back: Normal range of motion and neck  supple.     Comments: Plantar surface of right foot appears normal to inspection.  There is no bleeding, skin change or erythema.  There is a possibly small puncture wound over the ball of the foot.  Intact DP and PT pulses  Skin:    General: Skin is warm.  Neurological:     Mental Status: She is alert and oriented to person, place, and time.     Cranial Nerves: No cranial nerve deficit.     Motor: No abnormal muscle tone.     Coordination: Coordination normal.     Comments:  5/5 strength throughout. CN 2-12 intact.Equal grip strength.   Psychiatric:        Behavior: Behavior normal.     ED Results / Procedures / Treatments   Labs (all labs ordered are listed, but only abnormal results are displayed) Labs Reviewed - No data to display  EKG None  Radiology DG Foot Complete Right  Result Date: 11/17/2019 CLINICAL DATA:  Possibly stepped on a needle last week EXAM: RIGHT FOOT COMPLETE - 3+ VIEW COMPARISON:  None. FINDINGS: No metallic foreign body. No fracture or malalignment. Generalized osteopenia. Plantar heel spur. IMPRESSION: No acute finding or metallic foreign body. Electronically Signed   By: Monte Fantasia M.D.   On: 11/17/2019 11:56    Procedures Procedures (including critical care time)  Medications Ordered in ED Medications - No data to display  ED Course  I have reviewed the triage vital signs and the nursing notes.  Pertinent labs & imaging results that were available during my care of the patient were reviewed by me and considered in my medical decision making (see chart for details).    MDM Rules/Calculators/A&P                     Patient with puncture wound to the bottom of her right foot.  Needs tetanus updated.  She is neurovascularly intact.  X-ray shows no foreign body.  Tetanus is updated.  Patient has no evidence of infection but is concerned for this possibility.  She is a diabetic and takes Coumadin.  She is also penicillin allergic.  We will  treat prophylactically with Cipro.  Discussed with patient this may affect her Coumadin levels. Follow up with PCP for recheck as well as INR check this week.  Return precautions discussed. Final Clinical Impression(s) / ED Diagnoses Final diagnoses:  Puncture wound    Rx / DC Orders ED Discharge Orders    None       Evangaline Jou, Annie Main, MD 11/17/19 [phone]

## 2019-11-17 NOTE — Patient Instructions (Addendum)
F/u IN OFFICE WITH MD RE EVALUATE BLOOD PRESSURE IN 6 TO 8 WEEKS, CALL IF YOU NEED ME SOONER  PLEASE TAKE HYDRALAZINE AT 8AM AND 8 PM.  PLEASE DISCUSS BLOOD PRESSURE BEING HIGH AND YOUR IMDUR IN PARTICULAR WITH YOUR CARDIOLOGIST AT VISIT NEXT WEEK  MONITOR TOE FOR INFECTION WHERE YOU STEPPED ON A NEEDLE TODAY  GOOD TAT YOU ARE HAVING NO LINGERING EFFECTS FROM RECENT  COVID INFECTION, SPECIFICALLY NO FEVER, COUGH OR INCREASED SHORTNESS OF BREATH  FASTING LIPID, CMP AND EGFR AND TSH 1 WEEK BEFORE NEXT VISIT.  NO MORE SEWING, NO MORE NEEDLES!

## 2019-11-17 NOTE — ED Triage Notes (Signed)
PT states she put on her shoes this morning and got stuck in the middle part of her right foot by something sharp. PT states she took off her shoe and noticed a sewing needle had come through the bottom of her shoe. PT unsure of her last tetanus shot date. PT states she was covid positive 3 weeks ago but was asymptomatic.

## 2019-11-17 NOTE — Discharge Instructions (Signed)
Your x-ray is negative.  Your tetanus shot was updated.  The antibiotic you are given may affect your Coumadin levels and this should be followed closely by your doctor. Return to the ED if you develop new or worsening symptoms.

## 2019-11-17 NOTE — Progress Notes (Signed)
Virtual Visit via Telephone Note  I connected with [name] [name] on 11/17/19 at  3:00 PM EST by telephone and verified that I am speaking with the correct person using two identifiers.  Location: Patient: HOME  Provider: WORK   I discussed the limitations, risks, security and privacy concerns of performing an evaluation and management service by telephone and the availability of in person appointments. I also discussed with the patient that there may be a patient responsible charge related to this service. The patient expressed understanding and agreed to proceed.   History of Present Illness: F/u recent covid positive test and also ED visit today following puncture wound to foot from needle, became  Aware of discomfort, no recall of actual injury at the time it occurred.  F/U chronic problems, medication review, and refill medication when necessary. Review most recent labs and order labs which are due Review preventive health and update with necessary referrals or immunizations as indicated Denies recent fever or chills. Denies sinus pressure, nasal congestion, ear pain or sore throat. Denies chest congestion, productive cough or wheezing.Denies shortness  Denies chest pains, palpitations and leg swelling Denies abdominal pain, nausea, vomiting,diarrhea or constipation.   Denies dysuria, frequency, hesitancy or incontinence. Chronic  joint pain, swelling and limitation in mobility. Denies headaches, seizures, numbness, or tingling. Denies depression, anxiety or insomnia.       Observations/Objective: BP (!) 149/57   Ht 5\' 7"  (1.702 m)   Wt 184 lb (83.5 kg)   BMI 28.82 kg/m   Good communication with no confusion and intact memory. Alert and oriented x 3 No signs of respiratory distress during speech    Assessment and Plan: COVID-19 virus detected Denies fever, body aches or increased fatigue. No loss of taste or smell  Essential hypertension Uncontrolled, not taking  meds on schedule, will start this, also confusion re imdur, has appt with Card next week , this to be addressed at visit  Hypothyroidism, postsurgical Controlled, no change in medication   Prediabetes Patient educated about the importance of limiting  Carbohydrate intake , the need to commit to daily physical activity for a minimum of 30 minutes , and to commit weight loss. The fact that changes in all these areas will reduce or eliminate all together the development of diabetes is stressed.  Updated lab needed at/ before next visit.   Diabetic Labs Latest Ref Rng & Units 09/30/2019 08/04/2019 08/02/2019 07/07/2019 05/31/2019  HbA1c <5.7 % of total Hgb - - - - -  Microalbumin mg/dL - - - - -  Micro/Creat Ratio <30 mcg/mg creat - - - - -  Chol 0 - 200 mg/dL - - - 123 -  HDL >40 mg/dL - - - 48 -  Calc LDL 0 - 99 mg/dL - - - 63 -  Triglycerides <150 mg/dL - - - 61 -  Creatinine 0.44 - 1.00 mg/dL 1.68(H) 1.45(H) 1.44(H) 1.82(H) 1.48(H)   BP/Weight 11/17/2019 11/17/2019 11/05/2019 10/22/2019 10/08/2019 09/30/2019 99/83/3825  Systolic BP 053 976 734 193 790 240 973  Diastolic BP 76 57 63 73 73 62 75  Wt. (Lbs) 184 184 - 189 189.4 193.2 193  BMI 28.82 28.82 - 29.6 29.66 30.26 30.23   Foot/eye exam completion dates Latest Ref Rng & Units 07/17/2018 06/28/2017  Eye Exam No Retinopathy - -  Foot Form Completion - Done Done      Encounter for examination following treatment at hospital Reports no excess bleeding at puncture site, denies purulent drainage. I starting  antibiotic course from ED today. Will monitor at home for signs of infection  Trochanteric bursitis, right hip Managed by Ortho , no current flare     Follow Up Instructions:    I discussed the assessment and treatment plan with the patient. The patient was provided an opportunity to ask questions and all were answered. The patient agreed with the plan and demonstrated an understanding of the instructions.   The patient was  advised to call back or seek an in-person evaluation if the symptoms worsen or if the condition fails to improve as anticipated.  I provided 20 minutes of non-face-to-face time during this encounter.   [name], MD

## 2019-11-18 ENCOUNTER — Telehealth: Payer: Self-pay

## 2019-11-18 LAB — NOVEL CORONAVIRUS, NAA: SARS-CoV-2, NAA: NOT DETECTED

## 2019-11-18 NOTE — Telephone Encounter (Signed)
Patient called and she was informed that her test for COVID-19 done 11/17/19 was negative. She was not infected with the Novel Coronavirus.  She verbalized understanding of all information.

## 2019-11-19 ENCOUNTER — Other Ambulatory Visit: Payer: Self-pay

## 2019-11-19 ENCOUNTER — Ambulatory Visit: Payer: BLUE CROSS/BLUE SHIELD | Admitting: Family Medicine

## 2019-11-19 ENCOUNTER — Ambulatory Visit (INDEPENDENT_AMBULATORY_CARE_PROVIDER_SITE_OTHER): Payer: Medicare Other | Admitting: *Deleted

## 2019-11-19 ENCOUNTER — Telehealth: Payer: Self-pay | Admitting: Cardiovascular Disease

## 2019-11-19 DIAGNOSIS — Z5181 Encounter for therapeutic drug level monitoring: Secondary | ICD-10-CM

## 2019-11-19 DIAGNOSIS — Z952 Presence of prosthetic heart valve: Secondary | ICD-10-CM

## 2019-11-19 LAB — POCT INR: INR: 2.2 (ref 2.0–3.0)

## 2019-11-19 NOTE — Patient Instructions (Signed)
Continue warfarin 2 tablets daily except 1 tablet on Tuesdays  Continue greens Recheck in 6 weeks

## 2019-11-19 NOTE — Telephone Encounter (Signed)
The patient has been made aware that this will be fine.

## 2019-11-19 NOTE — Telephone Encounter (Signed)
   Pt requesting if she can bring her helper to her appointment on 11/24/19. She said she have an oxygen tank and couldn't carry it herself. She also added that her helper had complete covid vaccine.  Please advise

## 2019-11-23 ENCOUNTER — Other Ambulatory Visit: Payer: Self-pay | Admitting: Family Medicine

## 2019-11-23 ENCOUNTER — Encounter: Payer: Self-pay | Admitting: Family Medicine

## 2019-11-23 DIAGNOSIS — U071 COVID-19: Secondary | ICD-10-CM | POA: Insufficient documentation

## 2019-11-23 NOTE — Assessment & Plan Note (Signed)
Controlled, no change in medication  

## 2019-11-23 NOTE — Assessment & Plan Note (Signed)
Uncontrolled, not taking meds on schedule, will start this, also confusion re imdur, has appt with Card next week , this to be addressed at visit

## 2019-11-23 NOTE — Assessment & Plan Note (Signed)
Denies fever, body aches or increased fatigue. No loss of taste or smell

## 2019-11-23 NOTE — Assessment & Plan Note (Signed)
Managed by Ortho , no current flare

## 2019-11-23 NOTE — Assessment & Plan Note (Signed)
Patient educated about the importance of limiting  Carbohydrate intake , the need to commit to daily physical activity for a minimum of 30 minutes , and to commit weight loss. The fact that changes in all these areas will reduce or eliminate all together the development of diabetes is stressed.  Updated lab needed at/ before next visit.   Diabetic Labs Latest Ref Rng & Units 09/30/2019 08/04/2019 08/02/2019 07/07/2019 05/31/2019  HbA1c <5.7 % of total Hgb - - - - -  Microalbumin mg/dL - - - - -  Micro/Creat Ratio <30 mcg/mg creat - - - - -  Chol 0 - 200 mg/dL - - - 123 -  HDL >40 mg/dL - - - 48 -  Calc LDL 0 - 99 mg/dL - - - 63 -  Triglycerides <150 mg/dL - - - 61 -  Creatinine 0.44 - 1.00 mg/dL 1.68(H) 1.45(H) 1.44(H) 1.82(H) 1.48(H)   BP/Weight 11/17/2019 11/17/2019 11/05/2019 10/22/2019 10/08/2019 09/30/2019 09/31/1216  Systolic BP 244 695 072 257 505 183 358  Diastolic BP 76 57 63 73 73 62 75  Wt. (Lbs) 184 184 - 189 189.4 193.2 193  BMI 28.82 28.82 - 29.6 29.66 30.26 30.23   Foot/eye exam completion dates Latest Ref Rng & Units 07/17/2018 06/28/2017  Eye Exam No Retinopathy - -  Foot Form Completion - Done Done

## 2019-11-23 NOTE — Assessment & Plan Note (Signed)
Reports no excess bleeding at puncture site, denies purulent drainage. I starting antibiotic course from ED today. Will monitor at home for signs of infection

## 2019-11-24 ENCOUNTER — Other Ambulatory Visit: Payer: Self-pay

## 2019-11-24 ENCOUNTER — Encounter: Payer: Self-pay | Admitting: Cardiovascular Disease

## 2019-11-24 ENCOUNTER — Ambulatory Visit (INDEPENDENT_AMBULATORY_CARE_PROVIDER_SITE_OTHER): Payer: Medicare Other | Admitting: Cardiovascular Disease

## 2019-11-24 ENCOUNTER — Ambulatory Visit (INDEPENDENT_AMBULATORY_CARE_PROVIDER_SITE_OTHER): Payer: Medicare Other

## 2019-11-24 VITALS — BP 120/72 | HR 62 | Temp 97.0°F | Ht 67.0 in | Wt 199.0 lb

## 2019-11-24 DIAGNOSIS — Z950 Presence of cardiac pacemaker: Secondary | ICD-10-CM

## 2019-11-24 DIAGNOSIS — N1832 Chronic kidney disease, stage 3b: Secondary | ICD-10-CM

## 2019-11-24 DIAGNOSIS — Z952 Presence of prosthetic heart valve: Secondary | ICD-10-CM | POA: Diagnosis not present

## 2019-11-24 DIAGNOSIS — D594 Other nonautoimmune hemolytic anemias: Secondary | ICD-10-CM

## 2019-11-24 DIAGNOSIS — I5032 Chronic diastolic (congestive) heart failure: Secondary | ICD-10-CM | POA: Diagnosis not present

## 2019-11-24 DIAGNOSIS — Z95810 Presence of automatic (implantable) cardiac defibrillator: Secondary | ICD-10-CM

## 2019-11-24 DIAGNOSIS — I484 Atypical atrial flutter: Secondary | ICD-10-CM | POA: Diagnosis not present

## 2019-11-24 DIAGNOSIS — I442 Atrioventricular block, complete: Secondary | ICD-10-CM

## 2019-11-24 MED ORDER — METOLAZONE 2.5 MG PO TABS: ORAL_TABLET | ORAL | 5 refills | Status: DC

## 2019-11-24 NOTE — Progress Notes (Signed)
Cardiology Office Note    Date:  11/24/2019   ID:  [name] [name], DOB [birth year], MRN [id number]  PCP:  [name] Helper, MD  Cardiologist:  [name] [name], M.D. (EP/ICD); [name] Klein, MD   Chief Complaint  Patient presents with  . Congestive Heart Failure  . Cardiac Valve Problem    History of Present Illness:  [name] [name] is a 86 y.o. female returning in follow-up for valvular heart disease, persistent atrial fibrillation, complete heart block and chronic combined systolic and diastolic heart failure.  She was recently seen in ED with a puncture wound to her foot, but has not had any serious CV problems. She has NYHA 2-3 dyspnea, using O2 at 2L/min at home. Denies edema, syncope and palpitations. Feels weak on days that she takes metolazone. Weight is stable at 199 bpm. Creatinine stable at 1.68. Optivol stable last 6 months. No diuretic dose adjustments.  On March 18, 2019 she had an echocardiogram that showed normal left ventricular ejection fraction of 55%.  Both prosthetic valves were functioning normally.  Her biventricular pacemaker was implanted on Jan 30, 2019 by [name] [name].  The device is programmed VVIR.    Device check today shows normal function. "High" LV pacing threshold was reported by her device, but in office check by me today shows a LV pacing threshold of 2.5V@1 .0 ms, similar to previous in-office checks.. The underlying atrial mechanism remains atrial flutter. There has been no evidence of VT, either sustained or nonsustained. Estimated generator longevity is 3.8 years.  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  She is on chronic warfarin anticoagulation and her most recent INR was 2.0 on January 20.  She is pacemaker dependent due to complete heart block following aortic and mitral valve replacement with mechanical prostheses. (AVR 2001, MVR 2004, both  [employer] prostheses, surgeries performed in [place]). She had severe cardiomyopathy with an estimated left ventricular ejection fraction of 25%, but the most recent echo in March 18, 2019 showed improved LVEF to 55%.  She did not have any coronary artery disease by angiography before her first operation. In May 2016 her initial pacemaker lead was extracted and she received a CRT-D Medtronic Viva XT device by Dr. Cristopher [name].  She has had problems with multifactorial anemia, including a component of prosthesis related hemolytic anemia, baseline hemoglobin around 10.   Past Medical History:  Diagnosis Date  . Adenomatous polyp of colon 12/16/2002   Dr. [name]/[facility]  . Allergy   . Cataract   . CHB (complete heart block) ([place]) 10/07/2014      . CHF (congestive heart failure) ([place])    a.  EF previously reduced to 25-30% b. EF improved to 60-65% by echo in 10/2017.   [name] [name] Chronic kidney disease, stage I 2006  . Constipation       . COPD (chronic obstructive pulmonary disease) ([place])       . DJD (degenerative joint disease) of lumbar spine   . DM (diabetes mellitus) ([place]) 2006  . Esophageal reflux   . Glaucoma   . Gout       . H/O heart valve replacement with mechanical valve    a. s/p mechanical AVR in 2001 and mechanical MVR in 2004.  [name] [name] H/O wisdom tooth extraction   . Hyperlipemia   . IDA (iron deficiency anemia)  Parenteral iron/Dr. Tressie [name]  . Insomnia       . Non-ischemic cardiomyopathy ([place])    a. s/p MDT CRTD  . Obesity, unspecified   . Other and unspecified hyperlipidemia   . Oxygen deficiency 2014   nocturnal;  . Spondylolisthesis   . Spondylosis   . Thyroid cancer ([place])    remote thyroidectomy, no recurrence, pt denies in 2016 thyroid cancer  . Type 2 diabetes mellitus with hyperglycemia ([place]) 07/15/2010  . Unspecified hypothyroidism       . 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 Vertigo          Past Surgical History:  Procedure Laterality Date  . AORTIC AND MITRAL VALVE REPLACEMENT  2001  . BI-VENTRICULAR IMPLANTABLE CARDIOVERTER DEFIBRILLATOR UPGRADE N/A 01/18/2015   Procedure: BI-VENTRICULAR IMPLANTABLE CARDIOVERTER DEFIBRILLATOR UPGRADE;  Surgeon: [name] Lance, MD;  Location: [facility] CATH LAB;  Service: Cardiovascular;  Laterality: N/A;  . BIV ICD GENERATOR CHANGEOUT N/A 01/30/2019   Procedure: BIV ICD GENERATOR CHANGEOUT;  Surgeon: [name] Lance, MD;  Location: [facility] CV LAB;  Service: Cardiovascular;  Laterality: N/A;  . CARDIAC CATHETERIZATION    . CARDIAC VALVE REPLACEMENT    . CARDIOVERSION N/A 11/13/2016   Procedure: CARDIOVERSION;  Surgeon: [name] Klein, MD;  Location: [facility] ENDOSCOPY;  Service: Cardiovascular;  Laterality: N/A;  . COLONOSCOPY  06/12/2007   Rourk- Normal rectum/Normal colon  . COLONOSCOPY  12/16/02   small hemorrhoids  . COLONOSCOPY  06/20/2012   Procedure: COLONOSCOPY;  Surgeon: [name] Dolin, MD;  Location: [facility] ENDO [facility];  Service: Endoscopy;  Laterality: N/A;  10:45  . defibrillator implanted 2006    . DOPPLER ECHOCARDIOGRAPHY  2012  . DOPPLER ECHOCARDIOGRAPHY  05,06,07,08,09,2011  . ESOPHAGOGASTRODUODENOSCOPY  06/12/2007   Rourk- normal esophagus, small hiatal hernia, otherwise normal stomach, D1, D2  . EYE SURGERY Right 2005  . EYE SURGERY Left 2006  . ICD LEAD REMOVAL Left 02/08/2015   Procedure: ICD LEAD REMOVAL;  Surgeon: [name] Lance, MD;  Location: [facility] OR;  Service: Cardiovascular;  Laterality: Left;  "Will plan extraction and insertion of a BiV PM"  **Dr. Roxan [name] backing up case**  . IMPLANTABLE CARDIOVERTER DEFIBRILLATOR (ICD) GENERATOR CHANGE Left 02/08/2015   Procedure: ICD GENERATOR CHANGE;  Surgeon: [name] Lance, MD;  Location: [facility];  Service: Cardiovascular;  Laterality: Left;  . INSERT / REPLACE / REMOVE PACEMAKER    . PACEMAKER INSERTION  May 2016  . pacemaker placed  2004  . right breast cyst removed     benign   . right  cataract removed     2005  . TEE WITHOUT CARDIOVERSION N/A 11/13/2016   Procedure: TRANSESOPHAGEAL ECHOCARDIOGRAM (TEE);  Surgeon: [name] Klein, MD;  Location: [facility] ENDOSCOPY;  Service: Cardiovascular;  Laterality: N/A;  . THYROIDECTOMY      Current Medications: Outpatient Medications Prior to Visit  Medication Sig Dispense Refill  . acetaminophen (TYLENOL) 500 MG tablet Take 500 mg by mouth every 6 (six) hours as needed (pain).    [name] [name] albuterol (PROVENTIL) (2.5 MG/3ML) 0.083% nebulizer solution 90INHALE 1 VIAL VIA NEBULIZER THREE TIMES DAILY. 300 mL 3  . albuterol (VENTOLIN HFA) 108 (90 Base) MCG/ACT inhaler INHALE 2 PUFFS INTO THE LUNGS EVERY 6 HOURS AS NEEDED FOR WHEEZING OR SHORTNESS OF BREATH 54 g 2  . allopurinol (ZYLOPRIM) 100 MG tablet TAKE 1 TABLET(100 MG) BY MOUTH DAILY (Patient taking differently: Take 100 mg by mouth daily. ) 90 tablet 1  . benzonatate (  TESSALON) 100 MG capsule Take 1 capsule (100 mg total) by mouth 2 (two) times daily as needed for cough. 20 capsule 1  . BREO ELLIPTA 100-25 MCG/INH AEPB 1 TAKE 1 INHALATION BY MOUTH ONCE DAILY 1 each 4  . cholecalciferol (VITAMIN D3) 25 MCG (1000 UT) tablet Take 1,000 Units by mouth daily.    . ciprofloxacin (CIPRO) 250 MG tablet Take 2 tablets (500 mg total) by mouth 2 (two) times daily. 10 tablet 0  . docusate sodium (COLACE) 100 MG capsule Take 300 mg by mouth at bedtime.     . folic acid (FOLVITE) 1 MG tablet TAKE 1 TABLET(1 MG) BY MOUTH DAILY 100 tablet 5  . furosemide (LASIX) 40 MG tablet Take 1 tablet (40 mg total) by mouth daily. 90 tablet 1  . gabapentin (NEURONTIN) 300 MG capsule Take 1 capsule (300 mg total) by mouth 2 (two) times daily. 60 capsule 2  . hydrALAZINE (APRESOLINE) 25 MG tablet Take 1 tablet (25 mg total) by mouth 2 (two) times daily. 180 tablet 3  . HYDROcodone-acetaminophen (NORCO) 7.5-325 MG tablet Take 1 tablet by mouth every 6 (six) hours as needed for moderate pain (Must last 30 days.). 60 tablet 0  .  isosorbide mononitrate (IMDUR) 30 MG 24 hr tablet Take 1 tablet (30 mg total) by mouth daily. 30 tablet 6  . Liniments (SALONPAS PAIN RELIEF PATCH EX) Apply 1 patch topically daily as needed (pain).    . meclizine (ANTIVERT) 12.5 MG tablet Take 1 tablet (12.5 mg total) by mouth 3 (three) times daily as needed for dizziness. 30 tablet 2  . metoprolol succinate (TOPROL-XL) 100 MG 24 hr tablet TAKE 1 TABLET BY MOUTH EVERY DAY WITH FOOD OR MILK 90 tablet 1  . Multiple Vitamin (MULTIVITAMIN) LIQD Take 5 mLs by mouth daily.    . nitroGLYCERIN (NITROSTAT) 0.4 MG SL tablet Place 1 tablet (0.4 mg total) under the tongue every 5 (five) minutes as needed for chest pain. 30 tablet 0  . ondansetron (ZOFRAN) 4 MG tablet Take 1 tablet (4 mg total) by mouth daily as needed for nausea or vomiting. 90 tablet 1  . Polyethyl [name] (SYSTANE) 0.4-0.3 % GEL ophthalmic gel Place 1 application into the left eye at bedtime.    . potassium chloride SA (KLOR-CON) 20 MEQ tablet Take 1 tablet (20 mEq total) by mouth daily. 90 tablet 1  . pravastatin (PRAVACHOL) 40 MG tablet TAKE 1 TABLET BY MOUTH EVERY EVENING 90 tablet 3  . SYNTHROID 75 MCG tablet TAKE 1 TABLET BY MOUTH DAILY 90 tablet 1  . warfarin (JANTOVEN) 5 MG tablet TAKE 2 TABLETS EVERY DAY EXCEPT 1 TABLET ON SATURDAY 60 tablet 0  . fluticasone (FLONASE) 50 MCG/ACT nasal spray SHAKE LIQUID AND USE 2 SPRAYS IN EACH NOSTRIL DAILY (Patient taking differently: Place 2 sprays into both nostrils daily. ) 48 g 1  . metolazone (ZAROXOLYN) 2.5 MG tablet TAKE 1 TABLET TWICE A WEEK ON WEDNESDAYS AND SATURDAYS 30 tablet 6   Facility-Administered Medications Prior to Visit  Medication Dose Route Frequency Provider Last Rate Last Admin  . epoetin alfa (EPOGEN,PROCRIT) injection 40,000 Units  40,000 Units Subcutaneous Once Farrel Gobble, MD         Allergies:   Penicillins, Aspirin, Fentanyl, and Niacin   Social History   Socioeconomic History  . Marital  status: Widowed    Spouse name: Not on file  . Number of children: 0  . Years of education: Not on file  .  Highest education level: Not on file  Occupational History  . Occupation: retired    Fish farm manager: RETIRED  Tobacco Use  . Smoking status: Former Smoker    Packs/day: 0.75    Years: 40.00    Pack years: 30.00    Types: Cigarettes    Quit date: 03/08/1987    Years since quitting: 32.7  . Smokeless tobacco: Never Used  Substance and Sexual Activity  . Alcohol use: No    Alcohol/week: 0.0 standard drinks  . Drug use: No  . Sexual activity: Not Currently  Other Topics Concern  . Not on file  Social History Narrative   From [place] (2004)   Social Determinants of Health   Financial Resource Strain: Medium Risk  . Difficulty of Paying Living Expenses: Somewhat hard  Food Insecurity: No Food Insecurity  . Worried About Charity fundraiser in the Last Year: Never true  . Ran Out of Food in the Last Year: Never true  Transportation Needs: No Transportation Needs  . Lack of Transportation (Medical): No  . Lack of Transportation (Non-Medical): No  Physical Activity: Insufficiently Active  . Days of Exercise per Week: 2 days  . Minutes of Exercise per Session: 20 min  Stress: No Stress Concern Present  . Feeling of Stress : Not at all  Social Connections: Somewhat Isolated  . Frequency of Communication with Friends and Family: More than three times a week  . Frequency of Social Gatherings with Friends and Family: Never  . Attends Religious Services: More than 4 times per year  . Active Member of Clubs or Organizations: No  . Attends Archivist Meetings: Never  . Marital Status: Widowed     Family History:  The patient's family history includes Diabetes in her brother and brother; Heart attack in her sister; Heart disease in her brother, brother, father, and sister; Hypertension in her brother; Lung cancer in her brother; Pancreatic cancer in her mother.   ROS:    Please see the history of present illness.    ROS All other systems are reviewed and are negative.   PHYSICAL EXAM:   VS:  BP 120/72   Pulse 62   Temp (!) 97 F (36.1 C)   Ht 5\' 7"  (1.702 m)   Wt 199 lb (90.3 kg)   SpO2 90%   BMI 31.17 kg/m     General: Alert, oriented x3, no distress, mildly obese. Healthy pacemaker site Head: no evidence of trauma, PERRL, EOMI, no exophtalmos or lid lag, no myxedema, no xanthelasma; normal ears, nose and oropharynx Neck: normal jugular venous pulsations and no hepatojugular reflux; brisk carotid pulses without delay and no carotid bruits Chest: clear to auscultation, no signs of consolidation by percussion or palpation, normal fremitus, symmetrical and full respiratory excursions Cardiovascular: normal position and quality of the apical impulse, regular rhythm, crisp prosthetic clicks, 2/6 early peaking aortic ejection murmur, no diastolic murmurs, rubs or gallops Abdomen: no tenderness or distention, no masses by palpation, no abnormal pulsatility or arterial bruits, normal bowel sounds, no hepatosplenomegaly Extremities: no clubbing, cyanosis, trivial right ankle edema; 2+ radial, ulnar and brachial pulses bilaterally; 2+ right femoral, posterior tibial and dorsalis pedis pulses; 2+ left femoral, posterior tibial and dorsalis pedis pulses; no subclavian or femoral bruits Neurological: grossly nonfocal Psych: Normal mood and affect   Wt Readings from Last 3 Encounters:  11/24/19 199 lb (90.3 kg)  11/17/19 184 lb (83.5 kg)  11/17/19 184 lb (83.5 kg)  Studies/Labs Reviewed:   EKG:  EKG is ordered today.  It shows atrial flutter and BiV pacing (+R V1). Recent Labs: 03/20/2019: B Natriuretic Peptide 497.0 07/07/2019: TSH 0.795 09/30/2019: ALT 13; BUN 50; Creatinine, Ser 1.68; Potassium 4.8; Sodium 139 11/05/2019: Hemoglobin 9.5; Platelets 192   Lipid Panel    Component Value Date/Time   CHOL 123 07/07/2019 1354   CHOL 165 10/16/2016 1213    TRIG 61 07/07/2019 1354   HDL 48 07/07/2019 1354   HDL 31 (L) 10/16/2016 1213   CHOLHDL 2.6 07/07/2019 1354   VLDL 12 07/07/2019 1354   LDLCALC 63 07/07/2019 1354   LDLCALC 76 11/20/2018 1139     ASSESSMENT:    1. Chronic diastolic heart failure ([place])   2. Atypical atrial flutter ([facility])   3. CHB (complete heart block) ([facility])   4. Biventricular cardiac pacemaker in situ   5. H/O mitral valve replacement with mechanical valve   6. Hx of aortic valve replacement, mechanical   7. Other non-autoimmune hemolytic anemias ([place])   8. Stage 3b chronic kidney disease      PLAN:  In order of problems listed above:  1. CHF: Reports weakness on days with metolazone. Will reduce this to once weekly only. She appears to be clinically euvolemic and this is confirmed by stable thoracic impedance readings throughout the last 90 days.   Normal EF by echo June 2020.  On hydralazine/nitrates and metoprolol. 2. AFib/A flutter: Permanent arrhythmia.  Most recent ECG looks like atypical atrial flutter, possibly related to scar of previous left atriotomy. 3. CHB: Pacemaker dependent. 4. CRT-P: normal device function.  Excellent response to resynchronization therapy.  Recent device check on August 21 reviewed.  Followed by [name] [name]. 5. Mechanical mitral valve prosthesis: Echo June 2020 shows unchanged mean gradient of 5 mmHg, improved pressure half-time 66 ms 6. Mechanical aortic valve prosthesis: Echo June 2020 shows stable mean gradient 12 mmHg and dimensionless index 0.3 7. Prosthetic valve associated hemolysis: Hemoglobin slightly improved at 10.3, usually in the 9-10 range. 8. HTN: Blood pressure in normal range 9. CKD 3b: Stable. Creatinine baseline appears to be 1.6, corresponding to a GFR approximately 30. 10. Obesity: She said that he lost weight over the years but remains mildly obese.    Medication Adjustments/Labs and Tests Ordered: Current medicines are reviewed at length with the  patient today.  Concerns regarding medicines are outlined above.  Medication changes, Labs and Tests ordered today are listed in the Patient Instructions below. Patient Instructions  Medication Instructions:  Metolazone: Take 2.5 mg on Saturday only  If you need a refill on your cardiac medications before your next appointment, please call your pharmacy.   Lab work: None ordered If you have labs (blood work) drawn today and your tests are completely normal, you will receive your results only by: [place] (if you have MyChart) OR A paper copy in the mail If you have any lab test that is abnormal or we need to change your treatment, we will call you to review the results.  Testing/Procedures: None ordered  Follow-Up: At [facility], you and your health needs are our priority.  As part of our continuing mission to provide you with exceptional heart care, we have created designated Provider Care Teams.  These Care Teams include your primary Cardiologist (physician) and Advanced Practice Providers (APPs -  Physician Assistants and Nurse Practitioners) who all work together to provide you with the care you need, when you need it.  We recommend signing up for the patient portal called "MyChart".  Sign up information is provided on this After Visit Summary.  MyChart is used to connect with patients for Virtual Visits (Telemedicine).  Patients are able to view lab/test results, encounter notes, upcoming appointments, etc.  Non-urgent messages can be sent to your provider as well.   To learn more about what you can do with MyChart, go to [email or web address].    Your next appointment:   6 month(s)  The format for your next appointment:   In Person  Provider:   Sanda Klein, MD   Remote monitoring is used to monitor your pacemaker from home. This monitoring reduces the number of office visits required to check your device to one time per year. It allows [place] to keep an eye on the  functioning of your device to ensure it is working properly. You are scheduled for a device check from home on 5/51/21. You may send your transmission at any time that day. If you have a wireless device, the transmission will be sent automatically.       Signed, [name] Klein, MD  11/24/2019 5:23 PM    [facility], [place], [place]  [address] Phone: [phone]; Fax: [phone]

## 2019-11-24 NOTE — Patient Instructions (Signed)
Medication Instructions:  Metolazone: Take 2.5 mg on Saturday only  If you need a refill on your cardiac medications before your next appointment, please call your pharmacy.   Lab work: None ordered If you have labs (blood work) drawn today and your tests are completely normal, you will receive your results only by: [place] (if you have MyChart) OR A paper copy in the mail If you have any lab test that is abnormal or we need to change your treatment, we will call you to review the results.  Testing/Procedures: None ordered  Follow-Up: At [facility], you and your health needs are our priority.  As part of our continuing mission to provide you with exceptional heart care, we have created designated Provider Care Teams.  These Care Teams include your primary Cardiologist (physician) and Advanced Practice Providers (APPs -  Physician Assistants and Nurse Practitioners) who all work together to provide you with the care you need, when you need it.  We recommend signing up for the patient portal called "MyChart".  Sign up information is provided on this After Visit Summary.  MyChart is used to connect with patients for Virtual Visits (Telemedicine).  Patients are able to view lab/test results, encounter notes, upcoming appointments, etc.  Non-urgent messages can be sent to your provider as well.   To learn more about what you can do with MyChart, go to [email or web address].    Your next appointment:   6 month(s)  The format for your next appointment:   In Person  Provider:   Sanda Klein, MD   Remote monitoring is used to monitor your pacemaker from home. This monitoring reduces the number of office visits required to check your device to one time per year. It allows [place] to keep an eye on the functioning of your device to ensure it is working properly. You are scheduled for a device check from home on 5/51/21. You may send your transmission at any time that day. If you have a  wireless device, the transmission will be sent automatically.

## 2019-11-25 ENCOUNTER — Other Ambulatory Visit: Payer: Self-pay

## 2019-11-25 ENCOUNTER — Inpatient Hospital Stay (HOSPITAL_COMMUNITY): Payer: Medicare Other

## 2019-11-25 ENCOUNTER — Encounter (HOSPITAL_COMMUNITY): Payer: Self-pay | Admitting: Nurse Practitioner

## 2019-11-25 ENCOUNTER — Inpatient Hospital Stay (HOSPITAL_BASED_OUTPATIENT_CLINIC_OR_DEPARTMENT_OTHER): Payer: Medicare Other | Admitting: Nurse Practitioner

## 2019-11-25 ENCOUNTER — Other Ambulatory Visit (HOSPITAL_COMMUNITY)
Admission: RE | Admit: 2019-11-25 | Discharge: 2019-11-25 | Disposition: A | Discharge status: Home / Self Care | Payer: Medicare Other | Source: Ambulatory Visit | Attending: Nephrology | Admitting: Nephrology

## 2019-11-25 ENCOUNTER — Inpatient Hospital Stay (HOSPITAL_COMMUNITY): Payer: Medicare Other | Attending: Hematology

## 2019-11-25 DIAGNOSIS — E611 Iron deficiency: Secondary | ICD-10-CM

## 2019-11-25 DIAGNOSIS — D631 Anemia in chronic kidney disease: Secondary | ICD-10-CM | POA: Insufficient documentation

## 2019-11-25 DIAGNOSIS — E559 Vitamin D deficiency, unspecified: Secondary | ICD-10-CM | POA: Diagnosis present

## 2019-11-25 DIAGNOSIS — N184 Chronic kidney disease, stage 4 (severe): Secondary | ICD-10-CM

## 2019-11-25 DIAGNOSIS — Z79899 Other long term (current) drug therapy: Secondary | ICD-10-CM | POA: Insufficient documentation

## 2019-11-25 DIAGNOSIS — N1832 Chronic kidney disease, stage 3b: Secondary | ICD-10-CM | POA: Insufficient documentation

## 2019-11-25 DIAGNOSIS — R809 Proteinuria, unspecified: Secondary | ICD-10-CM | POA: Insufficient documentation

## 2019-11-25 DIAGNOSIS — D649 Anemia, unspecified: Secondary | ICD-10-CM

## 2019-11-25 DIAGNOSIS — N181 Chronic kidney disease, stage 1: Secondary | ICD-10-CM | POA: Insufficient documentation

## 2019-11-25 LAB — CBC WITH DIFFERENTIAL/PLATELET
Abs Immature Granulocytes: 0.01 10*3/uL (ref 0.00–0.07)
Basophils Absolute: 0 10*3/uL (ref 0.0–0.1)
Basophils Relative: 1 %
Eosinophils Absolute: 0 10*3/uL (ref 0.0–0.5)
Eosinophils Relative: 0 %
HCT: 33.1 % — ABNORMAL LOW (ref 36.0–46.0)
Hemoglobin: 9.9 g/dL — ABNORMAL LOW (ref 12.0–15.0)
Immature Granulocytes: 0 %
Lymphocytes Relative: 15 %
Lymphs Abs: 0.9 10*3/uL (ref 0.7–4.0)
MCH: 29.5 pg (ref 26.0–34.0)
MCHC: 29.9 g/dL — ABNORMAL LOW (ref 30.0–36.0)
MCV: 98.5 fL (ref 80.0–100.0)
Monocytes Absolute: 0.4 10*3/uL (ref 0.1–1.0)
Monocytes Relative: 7 %
Neutro Abs: 4.6 10*3/uL (ref 1.7–7.7)
Neutrophils Relative %: 77 %
Platelets: 177 10*3/uL (ref 150–400)
RBC: 3.36 MIL/uL — ABNORMAL LOW (ref 3.87–5.11)
RDW: 16.7 % — ABNORMAL HIGH (ref 11.5–15.5)
WBC: 5.9 10*3/uL (ref 4.0–10.5)
nRBC: 0 % (ref 0.0–0.2)

## 2019-11-25 LAB — RENAL FUNCTION PANEL
Albumin: 4 g/dL (ref 3.5–5.0)
Anion gap: 9 (ref 5–15)
BUN: 45 mg/dL — ABNORMAL HIGH (ref 8–23)
CO2: 29 mmol/L (ref 22–32)
Calcium: 9.4 mg/dL (ref 8.9–10.3)
Chloride: 99 mmol/L (ref 98–111)
Creatinine, Ser: 1.53 mg/dL — ABNORMAL HIGH (ref 0.44–1.00)
GFR calc Af Amer: 36 mL/min — ABNORMAL LOW (ref >60)
GFR calc non Af Amer: 31 mL/min — ABNORMAL LOW (ref >60)
Glucose, Bld: 99 mg/dL (ref 70–99)
Phosphorus: 3.9 mg/dL (ref 2.5–4.6)
Potassium: 5 mmol/L (ref 3.5–5.1)
Sodium: 137 mmol/L (ref 135–145)

## 2019-11-25 LAB — COMPREHENSIVE METABOLIC PANEL
ALT: 14 U/L (ref 0–44)
AST: 25 U/L (ref 15–41)
Albumin: 3.8 g/dL (ref 3.5–5.0)
Alkaline Phosphatase: 49 U/L (ref 38–126)
Anion gap: 8 (ref 5–15)
BUN: 44 mg/dL — ABNORMAL HIGH (ref 8–23)
CO2: 30 mmol/L (ref 22–32)
Calcium: 9.5 mg/dL (ref 8.9–10.3)
Chloride: 100 mmol/L (ref 98–111)
Creatinine, Ser: 1.47 mg/dL — ABNORMAL HIGH (ref 0.44–1.00)
GFR calc Af Amer: 37 mL/min — ABNORMAL LOW (ref >60)
GFR calc non Af Amer: 32 mL/min — ABNORMAL LOW (ref >60)
Glucose, Bld: 99 mg/dL (ref 70–99)
Potassium: 5 mmol/L (ref 3.5–5.1)
Sodium: 138 mmol/L (ref 135–145)
Total Bilirubin: 0.6 mg/dL (ref 0.3–1.2)
Total Protein: 7.8 g/dL (ref 6.5–8.1)

## 2019-11-25 LAB — FOLATE: Folate: 60 ng/mL (ref >5.9)

## 2019-11-25 LAB — FERRITIN: Ferritin: 407 ng/mL — ABNORMAL HIGH (ref 11–307)

## 2019-11-25 LAB — VITAMIN B12: Vitamin B-12: 412 pg/mL (ref 180–914)

## 2019-11-25 LAB — VITAMIN D 25 HYDROXY (VIT D DEFICIENCY, FRACTURES): Vit D, 25-Hydroxy: 36.28 ng/mL (ref 30–100)

## 2019-11-25 LAB — PROTEIN / CREATININE RATIO, URINE
Creatinine, Urine: 72.61 mg/dL
Protein Creatinine Ratio: 0.65 mg/mg{Cre} — ABNORMAL HIGH (ref 0.00–0.15)
Total Protein, Urine: 47 mg/dL

## 2019-11-25 LAB — IRON AND TIBC
Iron: 59 ug/dL (ref 28–170)
Saturation Ratios: 20 % (ref 10.4–31.8)
TIBC: 299 ug/dL (ref 250–450)
UIBC: 240 ug/dL

## 2019-11-25 LAB — LACTATE DEHYDROGENASE: LDH: 204 U/L — ABNORMAL HIGH (ref 98–192)

## 2019-11-25 MED ORDER — EPOETIN ALFA-EPBX 40000 UNIT/ML IJ SOLN
40000.0000 [IU] | Freq: Once | INTRAMUSCULAR | Status: AC
  Administered 2019-11-25: 40000 [IU] via SUBCUTANEOUS

## 2019-11-25 MED ORDER — EPOETIN ALFA-EPBX 40000 UNIT/ML IJ SOLN
INTRAMUSCULAR | Status: AC
  Filled 2019-11-25: qty 1

## 2019-11-25 NOTE — Assessment & Plan Note (Signed)
1.  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 -She has been on Procrit that initially started 3 years ago, with overall stability in her hemoglobin. -She is now on Procrit injections 40,000 units every 4 weeks. -She is taking iron tablets twice daily and tolerating them well. -Labs on 11/25/2019 showed hemoglobin 9.9, platelets 177, ferritin 407, percent saturation 20. -She does not need any IV iron at this time. -She will continue the Procrit injections every 4 weeks at this time. -She will follow-up in 2 months with repeat labs  2.  Chronic kidney disease: -Labs on 10/29/2019 showed serum creatinine is slightly increased at 1.53 -Continue to follow-up with nephrology.

## 2019-11-25 NOTE — Progress Notes (Signed)
[place] reviewed with and pt seen by RLockamy NP and pt approved for Retacrit injection today per NP                                   [name] [name] tolerated Retacrit injection well without complaints or incident. Hgb 9.9 today. VSS Pt discharged via wheelchair in satisfactory condition accompanied by family member

## 2019-11-25 NOTE — Progress Notes (Signed)
[address],  [id number]   CLINIC:  Medical Oncology/Hematology  PCP:  Fayrene Helper, MD [address] [place] [address] [phone]   REASON FOR VISIT: Follow-up for normocytic anemia  CURRENT THERAPY: Procrit injections every 4 weeks   INTERVAL HISTORY:  [name] [name] 86 y.o. female returns for routine follow-up for normocytic anemia.  Patient reports she has done well since her last visit.  She denies any bright red bleeding per rectum or melena.  She denies any easy bruising or bleeding.  She does report occasional shortness of breath with exertion.  And occasional constipation. Denies any nausea, vomiting, or diarrhea. Denies any new pains. Had not noticed any recent bleeding such as epistaxis, hematuria or hematochezia. Denies recent chest pain on exertion, shortness of breath on minimal exertion, pre-syncopal episodes, or palpitations. Denies any numbness or tingling in hands or feet. Denies any recent fevers, infections, or recent hospitalizations. Patient reports appetite at 100% and energy level at 25%.  She is eating well maintain her weight at this time.     REVIEW OF SYSTEMS:  Review of Systems  Respiratory: Positive for shortness of breath.   Gastrointestinal: Positive for constipation.  Neurological: Positive for numbness.  All other systems reviewed and are negative.    PAST MEDICAL/SURGICAL HISTORY:  Past Medical History:  Diagnosis Date  . Adenomatous polyp of colon 12/16/2002   Dr. [name]/[facility]  . Allergy   . Cataract   . CHB (complete heart block) ([place]) 10/07/2014      . CHF (congestive heart failure) ([place])    a.  EF previously reduced to 25-30% b. EF improved to 60-65% by echo in 10/2017.   Marland Kitchen Chronic kidney disease, stage I 2006  . Constipation       . COPD (chronic obstructive pulmonary disease) ([place])       . DJD (degenerative joint disease) of  lumbar spine   . DM (diabetes mellitus) ([place]) 2006  . Esophageal reflux   . Glaucoma   . Gout       . H/O heart valve replacement with mechanical valve    a. s/p mechanical AVR in 2001 and mechanical MVR in 2004.  Marland Kitchen H/O wisdom tooth extraction   . Hyperlipemia   . IDA (iron deficiency anemia)    Parenteral iron/[name]  . Insomnia       . Non-ischemic cardiomyopathy ([place])    a. s/p MDT CRTD  . Obesity, unspecified   . Other and unspecified hyperlipidemia   . Oxygen deficiency 2014   nocturnal;  . Spondylolisthesis   . Spondylosis   . Thyroid cancer ([place])    remote thyroidectomy, no recurrence, pt denies in 2016 thyroid cancer  . Type 2 diabetes mellitus with hyperglycemia ([place]) 07/15/2010  . Unspecified hypothyroidism       . 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 Vertigo        Past Surgical History:  Procedure Laterality Date  . AORTIC AND MITRAL VALVE REPLACEMENT  2001  . BI-VENTRICULAR IMPLANTABLE CARDIOVERTER DEFIBRILLATOR UPGRADE N/A 01/18/2015   Procedure: BI-VENTRICULAR IMPLANTABLE CARDIOVERTER DEFIBRILLATOR UPGRADE;  Surgeon: Evans Lance, MD;  Location: [facility] CATH LAB;  Service: Cardiovascular;  Laterality: N/A;  . BIV ICD GENERATOR CHANGEOUT N/A 01/30/2019   Procedure: BIV ICD GENERATOR CHANGEOUT;  Surgeon: Evans Lance, MD;  Location: [facility] CV LAB;  Service: Cardiovascular;  Laterality:  N/A;  . CARDIAC CATHETERIZATION    . CARDIAC VALVE REPLACEMENT    . CARDIOVERSION N/A 11/13/2016   Procedure: CARDIOVERSION;  Surgeon: Sanda Klein, MD;  Location: [facility] ENDOSCOPY;  Service: Cardiovascular;  Laterality: N/A;  . COLONOSCOPY  06/12/2007   Rourk- Normal rectum/Normal colon  . COLONOSCOPY  12/16/02   small hemorrhoids  . COLONOSCOPY  06/20/2012   Procedure: COLONOSCOPY;  Surgeon: Daneil Dolin, MD;  Location: [facility] ENDO SUITE;  Service: Endoscopy;  Laterality: N/A;  10:45  . defibrillator implanted 2006    . DOPPLER  ECHOCARDIOGRAPHY  2012  . DOPPLER ECHOCARDIOGRAPHY  05,06,07,08,09,2011  . ESOPHAGOGASTRODUODENOSCOPY  06/12/2007   Rourk- normal esophagus, small hiatal hernia, otherwise normal stomach, D1, D2  . EYE SURGERY Right 2005  . EYE SURGERY Left 2006  . ICD LEAD REMOVAL Left 02/08/2015   Procedure: ICD LEAD REMOVAL;  Surgeon: Evans Lance, MD;  Location: [facility] OR;  Service: Cardiovascular;  Laterality: Left;  "Will plan extraction and insertion of a BiV PM"  **Dr. Roxan Hockey backing up case**  . IMPLANTABLE CARDIOVERTER DEFIBRILLATOR (ICD) GENERATOR CHANGE Left 02/08/2015   Procedure: ICD GENERATOR CHANGE;  Surgeon: Evans Lance, MD;  Location: [facility];  Service: Cardiovascular;  Laterality: Left;  . INSERT / REPLACE / REMOVE PACEMAKER    . PACEMAKER INSERTION  May 2016  . pacemaker placed  2004  . right breast cyst removed     benign   . right cataract removed     2005  . TEE WITHOUT CARDIOVERSION N/A 11/13/2016   Procedure: TRANSESOPHAGEAL ECHOCARDIOGRAM (TEE);  Surgeon: Sanda Klein, MD;  Location: [facility] ENDOSCOPY;  Service: Cardiovascular;  Laterality: N/A;  . THYROIDECTOMY       SOCIAL HISTORY:  Social History   Socioeconomic History  . Marital status: Widowed    Spouse name: Not on file  . Number of children: 0  . Years of education: Not on file  . Highest education level: Not on file  Occupational History  . Occupation: retired    Fish farm manager: RETIRED  Tobacco Use  . Smoking status: Former Smoker    Packs/day: 0.75    Years: 40.00    Pack years: 30.00    Types: Cigarettes    Quit date: 03/08/1987    Years since quitting: 32.7  . Smokeless tobacco: Never Used  Substance and Sexual Activity  . Alcohol use: No    Alcohol/week: 0.0 standard drinks  . Drug use: No  . Sexual activity: Not Currently  Other Topics Concern  . Not on file  Social History Narrative   From [place] (2004)   Social Determinants of Health   Financial Resource Strain: Medium Risk  . Difficulty  of Paying Living Expenses: Somewhat hard  Food Insecurity: No Food Insecurity  . Worried About Charity fundraiser in the Last Year: Never true  . Ran Out of Food in the Last Year: Never true  Transportation Needs: No Transportation Needs  . Lack of Transportation (Medical): No  . Lack of Transportation (Non-Medical): No  Physical Activity: Insufficiently Active  . Days of Exercise per Week: 2 days  . Minutes of Exercise per Session: 20 min  Stress: No Stress Concern Present  . Feeling of Stress : Not at all  Social Connections: Somewhat Isolated  . Frequency of Communication with Friends and Family: More than three times a week  . Frequency of Social Gatherings with Friends and Family: Never  . Attends Religious Services: More than 4 times per  year  . Active Member of Clubs or Organizations: No  . Attends Archivist Meetings: Never  . Marital Status: Widowed  Intimate Partner Violence: Not At Risk  . Fear of Current or Ex-Partner: No  . Emotionally Abused: No  . Physically Abused: No  . Sexually Abused: No    FAMILY HISTORY:  Family History  Problem Relation Age of Onset  . Pancreatic cancer Mother   . Heart disease Father   . Heart disease Sister   . Heart attack Sister   . Heart disease Brother   . Diabetes Brother   . Heart disease Brother   . Diabetes Brother   . Hypertension Brother   . Lung cancer Brother     CURRENT MEDICATIONS:  Outpatient Encounter Medications as of 11/25/2019  Medication Sig  . allopurinol (ZYLOPRIM) 100 MG tablet TAKE 1 TABLET(100 MG) BY MOUTH DAILY (Patient taking differently: Take 100 mg by mouth daily. )  . BREO ELLIPTA 100-25 MCG/INH AEPB 1 TAKE 1 INHALATION BY MOUTH ONCE DAILY  . cholecalciferol (VITAMIN D3) 25 MCG (1000 UT) tablet Take 1,000 Units by mouth daily.  Marland Kitchen docusate sodium (COLACE) 100 MG capsule Take 300 mg by mouth at bedtime.   . fluticasone (FLONASE) 50 MCG/ACT nasal spray SHAKE LIQUID AND USE 2 SPRAYS IN EACH  NOSTRIL DAILY  . folic acid (FOLVITE) 1 MG tablet TAKE 1 TABLET(1 MG) BY MOUTH DAILY  . furosemide (LASIX) 40 MG tablet Take 1 tablet (40 mg total) by mouth daily.  Marland Kitchen gabapentin (NEURONTIN) 300 MG capsule Take 1 capsule (300 mg total) by mouth 2 (two) times daily.  . hydrALAZINE (APRESOLINE) 25 MG tablet Take 1 tablet (25 mg total) by mouth 2 (two) times daily.  . isosorbide mononitrate (IMDUR) 30 MG 24 hr tablet Take 1 tablet (30 mg total) by mouth daily.  . metolazone (ZAROXOLYN) 2.5 MG tablet Take one tablet on Saturdays.  . metoprolol succinate (TOPROL-XL) 100 MG 24 hr tablet TAKE 1 TABLET BY MOUTH EVERY DAY WITH FOOD OR MILK  . Multiple Vitamin (MULTIVITAMIN) LIQD Take 5 mLs by mouth daily.  [name] [name] (SYSTANE) 0.4-0.3 % GEL ophthalmic gel Place 1 application into the left eye at bedtime.  . potassium chloride SA (KLOR-CON) 20 MEQ tablet Take 1 tablet (20 mEq total) by mouth daily.  . pravastatin (PRAVACHOL) 40 MG tablet TAKE 1 TABLET BY MOUTH EVERY EVENING  . SYNTHROID 75 MCG tablet TAKE 1 TABLET BY MOUTH DAILY  . warfarin (JANTOVEN) 5 MG tablet TAKE 2 TABLETS EVERY DAY EXCEPT 1 TABLET ON SATURDAY  . acetaminophen (TYLENOL) 500 MG tablet Take 500 mg by mouth every 6 (six) hours as needed (pain).  Marland Kitchen albuterol (PROVENTIL) (2.5 MG/3ML) 0.083% nebulizer solution 90INHALE 1 VIAL VIA NEBULIZER THREE TIMES DAILY. (Patient not taking: Reported on 11/25/2019)  . albuterol (VENTOLIN HFA) 108 (90 Base) MCG/ACT inhaler INHALE 2 PUFFS INTO THE LUNGS EVERY 6 HOURS AS NEEDED FOR WHEEZING OR SHORTNESS OF BREATH (Patient not taking: Reported on 11/25/2019)  . benzonatate (TESSALON) 100 MG capsule Take 1 capsule (100 mg total) by mouth 2 (two) times daily as needed for cough. (Patient not taking: Reported on 11/25/2019)  . HYDROcodone-acetaminophen (NORCO) 7.5-325 MG tablet Take 1 tablet by mouth every 6 (six) hours as needed for moderate pain (Must last 30 days.). (Patient not taking:  Reported on 11/25/2019)  . Liniments (SALONPAS PAIN RELIEF PATCH EX) Apply 1 patch topically daily as needed (pain).  . meclizine (ANTIVERT) 12.5  MG tablet Take 1 tablet (12.5 mg total) by mouth 3 (three) times daily as needed for dizziness. (Patient not taking: Reported on 11/25/2019)  . nitroGLYCERIN (NITROSTAT) 0.4 MG SL tablet Place 1 tablet (0.4 mg total) under the tongue every 5 (five) minutes as needed for chest pain. (Patient not taking: Reported on 11/25/2019)  . ondansetron (ZOFRAN) 4 MG tablet Take 1 tablet (4 mg total) by mouth daily as needed for nausea or vomiting. (Patient not taking: Reported on 11/25/2019)  . [DISCONTINUED] ciprofloxacin (CIPRO) 250 MG tablet Take 2 tablets (500 mg total) by mouth 2 (two) times daily.   Facility-Administered Encounter Medications as of 11/25/2019  Medication  . epoetin alfa (EPOGEN,PROCRIT) injection 40,000 Units    ALLERGIES:  Allergies  Allergen Reactions  . Penicillins Other (See Comments)    Bruise Did it involve swelling of the face/tongue/throat, SOB, or low BP? No Did it involve sudden or severe rash/hives, skin peeling, or any reaction on the inside of your mouth or nose? No Did you need to seek medical attention at a hospital or doctor's office? No When did it last happen?20+ years If all above answers are "NO", may proceed with cephalosporin use.   . Aspirin Other (See Comments)    On coumadin  . Fentanyl Dermatitis    Rash with patch   . Niacin Itching     PHYSICAL EXAM:  ECOG Performance status: 2  Vitals:   11/25/19 1041  BP: (!) 145/58  Pulse: (!) 59  Resp: 18  Temp: (!) 97.1 F (36.2 C)  SpO2: 94%   Filed Weights   11/25/19 1041  Weight: 196 lb 12.8 oz (89.3 kg)    Physical Exam Constitutional:      Appearance: Normal appearance. She is normal weight.  Cardiovascular:     Rate and Rhythm: Normal rate and regular rhythm.     Heart sounds: Normal heart sounds.  Pulmonary:     Effort: Pulmonary effort  is normal.     Breath sounds: Normal breath sounds.  Abdominal:     General: Bowel sounds are normal.     Palpations: Abdomen is soft.  Musculoskeletal:        General: Normal range of motion.  Skin:    General: Skin is warm.  Neurological:     Mental Status: She is alert and oriented to person, place, and time. Mental status is at baseline.  Psychiatric:        Mood and Affect: Mood normal.        Behavior: Behavior normal.        Thought Content: Thought content normal.        Judgment: Judgment normal.      LABORATORY DATA:  I have reviewed the labs as listed.  CBC    Component Value Date/Time   WBC 5.9 11/25/2019 1013   RBC 3.36 (L) 11/25/2019 1013   HGB 9.9 (L) 11/25/2019 1013   HGB 9.6 (L) 01/28/2019 1218   HCT 33.1 (L) 11/25/2019 1013   HCT 29.9 (L) 01/28/2019 1218   PLT 177 11/25/2019 1013   PLT 199 01/28/2019 1218   MCV 98.5 11/25/2019 1013   MCV 90 01/28/2019 1218   MCH 29.5 11/25/2019 1013   MCHC 29.9 (L) 11/25/2019 1013   RDW 16.7 (H) 11/25/2019 1013   RDW 17.1 (H) 01/28/2019 1218   LYMPHSABS 0.9 11/25/2019 1013   LYMPHSABS 0.7 01/28/2019 1218   MONOABS 0.4 11/25/2019 1013   EOSABS 0.0 11/25/2019 1013  EOSABS 0.0 01/28/2019 1218   BASOSABS 0.0 11/25/2019 1013   BASOSABS 0.0 01/28/2019 1218   CMP Latest Ref Rng & Units 11/25/2019 11/25/2019 09/30/2019  Glucose 70 - 99 mg/dL 99 99 91  BUN 8 - 23 mg/dL 44(H) 45(H) 50(H)  Creatinine 0.44 - 1.00 mg/dL 1.47(H) 1.53(H) 1.68(H)  Sodium 135 - 145 mmol/L 138 137 139  Potassium 3.5 - 5.1 mmol/L 5.0 5.0 4.8  Chloride 98 - 111 mmol/L 100 99 101  CO2 22 - 32 mmol/L 30 29 28   Calcium 8.9 - 10.3 mg/dL 9.5 9.4 9.3  Total Protein 6.5 - 8.1 g/dL 7.8 - 7.7  Total Bilirubin 0.3 - 1.2 mg/dL 0.6 - 0.5  Alkaline Phos 38 - 126 U/L 49 - 47  AST 15 - 41 U/L 25 - 25  ALT 0 - 44 U/L 14 - 13     I personally performed a face-to-face visit.  All questions were answered to patient's stated satisfaction. Encouraged patient to  call with any new concerns or questions before his next visit to the cancer center and we can certain see him sooner, if needed.      ASSESSMENT & PLAN:   Iron deficiency 1.  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 -She has been on Procrit that initially started 3 years ago, with overall stability in her hemoglobin. -She is now on Procrit injections 40,000 units every 4 weeks. -She is taking iron tablets twice daily and tolerating them well. -Labs on 11/25/2019 showed hemoglobin 9.9, platelets 177, ferritin 407, percent saturation 20. -She does not need any IV iron at this time. -She will continue the Procrit injections every 4 weeks at this time. -She will follow-up in 2 months with repeat labs  2.  Chronic kidney disease: -Labs on 10/29/2019 showed serum creatinine is slightly increased at 1.53 -Continue to follow-up with nephrology.        Orders placed this encounter:  Orders Placed This Encounter  Procedures  . Lactate dehydrogenase  . CBC with Differential/Platelet  . Comprehensive metabolic panel  . Ferritin  . Iron and TIBC  . Vitamin B12  . VITAMIN D 25 Hydroxy (Vit-D Deficiency, Fractures)      Francene Finders, FNP-C [place] [phone]

## 2019-11-25 NOTE — Patient Instructions (Signed)
[place] at [facility] Discharge Instructions  Continue injections and labs every 4 weeks. Follow up for dr visit in 2 months.    Thank you for choosing [place] at [facility] to provide your oncology and hematology care.  To afford each patient quality time with our provider, please arrive at least 15 minutes before your scheduled appointment time.   If you have a lab appointment with the [facility] please come in thru the Main Entrance and check in at the main information desk.  You need to re-schedule your appointment should you arrive 10 or more minutes late.  We strive to give you quality time with our providers, and arriving late affects you and other patients whose appointments are after yours.  Also, if you no show three or more times for appointments you may be dismissed from the clinic at the providers discretion.     Again, thank you for choosing [facility].  Our hope is that these requests will decrease the amount of time that you wait before being seen by our physicians.       _____________________________________________________________  Should you have questions after your visit to [facility], please contact our office at [phone] between the hours of 8:00 a.m. and 4:30 p.m.  Voicemails left after 4:00 p.m. will not be returned until the following business day.  For prescription refill requests, have your pharmacy contact our office and allow 72 hours.    Due to Covid, you will need to wear a mask upon entering the hospital. If you do not have a mask, a mask will be given to you at the Main Entrance upon arrival. For doctor visits, patients may have 1 support person with them. For treatment visits, patients can not have anyone with them due to social distancing guidelines and our immunocompromised population.

## 2019-11-25 NOTE — Patient Instructions (Signed)
[facility] at [facility] Discharge Instructions  Received Retacrit injection today. Follow-up as scheduled. Call clinic for any questions or concerns   Thank you for choosing [facility] at [facility] to provide your oncology and hematology care.  To afford each patient quality time with our provider, please arrive at least 15 minutes before your scheduled appointment time.   If you have a lab appointment with the Cancer Center please come in thru the Main Entrance and check in at the main information desk.  You need to re-schedule your appointment should you arrive 10 or more minutes late.  We strive to give you quality time with our providers, and arriving late affects you and other patients whose appointments are after yours.  Also, if you no show three or more times for appointments you may be dismissed from the clinic at the providers discretion.     Again, thank you for choosing [facility].  Our hope is that these requests will decrease the amount of time that you wait before being seen by our physicians.       _____________________________________________________________  Should you have questions after your visit to [facility], please contact our office at [phone] between the hours of 8:00 a.m. and 4:30 p.m.  Voicemails left after 4:00 p.m. will not be returned until the following business day.  For prescription refill requests, have your pharmacy contact our office and allow 72 hours.    Due to Covid, you will need to wear a mask upon entering the hospital. If you do not have a mask, a mask will be given to you at the Main Entrance upon arrival. For doctor visits, patients may have 1 support person with them. For treatment visits, patients can not have anyone with them due to social distancing guidelines and our immunocompromised population.     

## 2019-11-26 LAB — PARATHYROID HORMONE, INTACT (NO CA): PTH: 49 pg/mL (ref 15–65)

## 2019-11-27 ENCOUNTER — Telehealth: Payer: Self-pay | Admitting: Physician Assistant

## 2019-11-27 NOTE — Telephone Encounter (Signed)
The patient states she needs assistance with her oxygen tank for her visit. Informed her she is allowed to being a visitor.  Note placed on appointment to allow a guest.

## 2019-11-27 NOTE — Telephone Encounter (Signed)
  Patient is calling to say that her aide will have to come with her to her appointment to assist her with her oxygen. Her appt is on 12/02/19.

## 2019-11-28 ENCOUNTER — Other Ambulatory Visit (INDEPENDENT_AMBULATORY_CARE_PROVIDER_SITE_OTHER): Payer: Medicare Other

## 2019-11-28 ENCOUNTER — Telehealth: Payer: Self-pay | Admitting: Internal Medicine

## 2019-11-28 DIAGNOSIS — L603 Nail dystrophy: Secondary | ICD-10-CM | POA: Diagnosis not present

## 2019-11-28 DIAGNOSIS — I10 Essential (primary) hypertension: Secondary | ICD-10-CM | POA: Diagnosis not present

## 2019-11-28 DIAGNOSIS — E1142 Type 2 diabetes mellitus with diabetic polyneuropathy: Secondary | ICD-10-CM | POA: Diagnosis not present

## 2019-11-28 DIAGNOSIS — L851 Acquired keratosis [keratoderma] palmaris et plantaris: Secondary | ICD-10-CM | POA: Diagnosis not present

## 2019-11-28 NOTE — Telephone Encounter (Signed)
We are recommending the COVID-19 vaccine to all of our patients. Cardiac medications (including blood thinners) should not deter anyone from being vaccinated and there is no need to hold any of those medications prior to vaccine administration.     Currently, there is a hotline to call (active 10/03/19) to schedule vaccination appointments as no walk-ins will be accepted.   Number: [phone].    If an appointment is not available please go to [email or web address] to sign up for notification when additional vaccine appointments are available.   If you have further questions or concerns about the vaccine process, please visit [email or web address] or contact your primary care physician.   

## 2019-11-28 NOTE — Progress Notes (Signed)
EPIC Encounter for ICM Monitoring  Patient Name: [name] [name] is a 86 y.o. female Date: 11/28/2019 Primary Care Physican: Fayrene Helper, MD Primary Cardiologist:Croitoru Electrophysiologist:Taylor Bi-V Pacing:99.7% 11/24/2019 OfficeWeight:199lbs    Transmission reviewed.  Optivol thoracic impedancenormal.  Prescribed:  Furosemide40 mg 1 tablet daily.   Metolazaone 2.5 mg Take 1tablet twicea week (Wednesday and Saturdays).   Potassium 20 mEq take 1 tablet daily.  Labs: 11/25/2019 Creatinine 1.47, BUN 44, Potassium 5.0, Sodium 138, GFR 32-37 09/30/2019 Creatinine 1.68, BUN 50, Potassium 4.8, Sodium 139, GFR 27-32 08/04/2019 Creatinine 1.45, BUN 38, Potassium 3.9, Sodium 135, GFR 33-38 08/02/2019 Creatinine 1.44, BUN 43, Potassium 4.3, Sodium 134, GFR 33-38 07/07/2019 Creatinine 1.82, BUN 54, Potassium 4.4, Sodium 132, GFR 25-29 05/31/2019 Creatinine1.48, BUN45, Potassium4.4, Sodium132, GFR32-37 05/30/2019 Creatinine1.54, BUN47, Potassium4.2, Sodium131, GFR30-35  05/29/2019 Creatinine1.86, BUN50, Potassium4.6, Sodium129, [id number] A complete set of results can be found in Results Review.  Recommendations:None  Follow-up plan: ICM clinic phone appointment on4/01/2020.91 day device clinic remote transmission5/21/2021.  Office appt 12/02/2019 with Tommye Standard, PA.    Copy of ICM check sent to Dr. Lovena Le.   3 month ICM trend: 11/24/2019    1 Year ICM trend:       [name], RN 11/28/2019 3:41 PM

## 2019-11-30 NOTE — Progress Notes (Deleted)
Cardiology Office Note Date:  11/30/2019  Patient ID:  [name], [name] [birth year], MRN [id number] PCP:  Fayrene Helper, MD  Cardiologist:  Dr. Sallyanne Kuster Electrophysiologist: Dr. Lovena Le    Chief Complaint: LV lead threshold alert  History of Present Illness: [name] [name] is a 86 y.o. female with history of DM, hypothyroidism, NICM with subsequent improvement in LVEF, CHB w/CRT-P, COPD w/home O2, permanent Afib/ flutter, VHD w/ mechanical AVR (2001) and MVR (2004), chronic multifactorial anemia, including a component of prosthesis related hemolytic anemia (follows with oncology), CKD (III), AAA follows annually with [facility] (Dr. Donnetta Hutching).  She comes in today to be seen for Dr.Taylor.  Last seen by him in Aug 2020, at that time, mentioned more sedentary, minimal edema, no symptoms She was in AFlutter, rate controlled, not felt a good candidate for rhythm control to defer to rate control strategy for her, on warfarin for a/c. She saw Dr. Sallyanne Kuster only a couple days later, no changes were made. She had an ER visit 08/03/2019 for CP.  HS Trop mildly elevated/flat and at her known baseline, ER MD note reports low suspicion for ACS, and in d/w Dr. Sallyanne Kuster, Rx s/l NTG (ICM notes look like she may have been volume OL, was trending downward at that time.  I saw her Dec 2020, she was accompanied by her niece, the patient in a wheelchair.    She was wearing her Oxygen, mentions her PMD told her to wear it all of the time now, but Dr. Lovena Le had told her to try not to use it.  (I recommended she continue with her PMD who is managing her oxygen) She had not had recurrent CP, she felt this was gas, also had some breast discomfort that resolved.  No CP or "heart pains".  No palpitations.  No dizziness, near syncope or syncope. She has some baseline SOB, unchanged.  "I breath OK" She mentioned she is up and around, though generally not feeling like she has much energy. Device interrogation noted only 81%  effective BV pacing Her LV lead thresholds were increasing and LV pacing vectors checked (again), the best bing LV3>LV2, outputs increased in hopes of improved effective pacing Planned to come in to re-evaluate after the changes  She was COVID + 10/21/19 asymptomatic  She saw Dr. Vivia Ewing 11/24/19 was doing well, device was checked LV threshold stable, volume status stable.  *** volume *** symptoms *** meds, CM *** bleeding, warfarin, who manages     Device information MDT CRT-P  Most recent gen change may 2020 (downgrade from ICD) RA lead implanted 2004 RV lead placement 2007  LV lead 2016 STJude lead Original RV pacing lead was extracted at time of CRT upgrade 2016    Past Medical History:  Diagnosis Date  . Adenomatous polyp of colon 12/16/2002   Dr. [name]/[facility]  . Allergy   . Cataract   . CHB (complete heart block) ([place]) 10/07/2014      . CHF (congestive heart failure) ([place])    a.  EF previously reduced to 25-30% b. EF improved to 60-65% by echo in 10/2017.   Marland Kitchen Chronic kidney disease, stage I 2006  . Constipation       . COPD (chronic obstructive pulmonary disease) ([place])       . DJD (degenerative joint disease) of lumbar spine   . DM (diabetes mellitus) ([place]) 2006  . Esophageal reflux   . Glaucoma   . Gout       .  H/O heart valve replacement with mechanical valve    a. s/p mechanical AVR in 2001 and mechanical MVR in 2004.  Marland Kitchen H/O wisdom tooth extraction   . Hyperlipemia   . IDA (iron deficiency anemia)    Parenteral iron/[name]  . Insomnia       . Non-ischemic cardiomyopathy ([place])    a. s/p MDT CRTD  . Obesity, unspecified   . Other and unspecified hyperlipidemia   . Oxygen deficiency 2014   nocturnal;  . Spondylolisthesis   . Spondylosis   . Thyroid cancer ([place])    remote thyroidectomy, no recurrence, pt denies in 2016 thyroid cancer  . Type 2 diabetes mellitus with hyperglycemia ([place]) 07/15/2010  .  Unspecified hypothyroidism       . 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 Vertigo         Past Surgical History:  Procedure Laterality Date  . AORTIC AND MITRAL VALVE REPLACEMENT  2001  . BI-VENTRICULAR IMPLANTABLE CARDIOVERTER DEFIBRILLATOR UPGRADE N/A 01/18/2015   Procedure: BI-VENTRICULAR IMPLANTABLE CARDIOVERTER DEFIBRILLATOR UPGRADE;  Surgeon: Evans Lance, MD;  Location: [facility] CATH LAB;  Service: Cardiovascular;  Laterality: N/A;  . BIV ICD GENERATOR CHANGEOUT N/A 01/30/2019   Procedure: BIV ICD GENERATOR CHANGEOUT;  Surgeon: Evans Lance, MD;  Location: [facility] CV LAB;  Service: Cardiovascular;  Laterality: N/A;  . CARDIAC CATHETERIZATION    . CARDIAC VALVE REPLACEMENT    . CARDIOVERSION N/A 11/13/2016   Procedure: CARDIOVERSION;  Surgeon: Sanda Klein, MD;  Location: [facility] ENDOSCOPY;  Service: Cardiovascular;  Laterality: N/A;  . COLONOSCOPY  06/12/2007   Rourk- Normal rectum/Normal colon  . COLONOSCOPY  12/16/02   small hemorrhoids  . COLONOSCOPY  06/20/2012   Procedure: COLONOSCOPY;  Surgeon: Daneil Dolin, MD;  Location: [facility] ENDO SUITE;  Service: Endoscopy;  Laterality: N/A;  10:45  . defibrillator implanted 2006    . DOPPLER ECHOCARDIOGRAPHY  2012  . DOPPLER ECHOCARDIOGRAPHY  05,06,07,08,09,2011  . ESOPHAGOGASTRODUODENOSCOPY  06/12/2007   Rourk- normal esophagus, small hiatal hernia, otherwise normal stomach, D1, D2  . EYE SURGERY Right 2005  . EYE SURGERY Left 2006  . ICD LEAD REMOVAL Left 02/08/2015   Procedure: ICD LEAD REMOVAL;  Surgeon: Evans Lance, MD;  Location: [facility] OR;  Service: Cardiovascular;  Laterality: Left;  "Will plan extraction and insertion of a BiV PM"  **Dr. Roxan Hockey backing up case**  . IMPLANTABLE CARDIOVERTER DEFIBRILLATOR (ICD) GENERATOR CHANGE Left 02/08/2015   Procedure: ICD GENERATOR CHANGE;  Surgeon: Evans Lance, MD;  Location: [place];  Service: Cardiovascular;  Laterality: Left;  . INSERT / REPLACE / REMOVE  PACEMAKER    . PACEMAKER INSERTION  May 2016  . pacemaker placed  2004  . right breast cyst removed     benign   . right cataract removed     2005  . TEE WITHOUT CARDIOVERSION N/A 11/13/2016   Procedure: TRANSESOPHAGEAL ECHOCARDIOGRAM (TEE);  Surgeon: Sanda Klein, MD;  Location: [facility] ENDOSCOPY;  Service: Cardiovascular;  Laterality: N/A;  . THYROIDECTOMY      Current Outpatient Medications  Medication Sig Dispense Refill  . acetaminophen (TYLENOL) 500 MG tablet Take 500 mg by mouth every 6 (six) hours as needed (pain).    Marland Kitchen albuterol (PROVENTIL) (2.5 MG/3ML) 0.083% nebulizer solution 90INHALE 1 VIAL VIA NEBULIZER THREE TIMES DAILY. (Patient not taking: Reported on 11/25/2019) 300 mL 3  . albuterol (VENTOLIN HFA) 108 (90 Base) MCG/ACT inhaler INHALE 2 PUFFS INTO THE LUNGS EVERY  6 HOURS AS NEEDED FOR WHEEZING OR SHORTNESS OF BREATH (Patient not taking: Reported on 11/25/2019) 54 g 2  . allopurinol (ZYLOPRIM) 100 MG tablet TAKE 1 TABLET(100 MG) BY MOUTH DAILY (Patient taking differently: Take 100 mg by mouth daily. ) 90 tablet 1  . benzonatate (TESSALON) 100 MG capsule Take 1 capsule (100 mg total) by mouth 2 (two) times daily as needed for cough. (Patient not taking: Reported on 11/25/2019) 20 capsule 1  . BREO ELLIPTA 100-25 MCG/INH AEPB 1 TAKE 1 INHALATION BY MOUTH ONCE DAILY 1 each 4  . cholecalciferol (VITAMIN D3) 25 MCG (1000 UT) tablet Take 1,000 Units by mouth daily.    Marland Kitchen docusate sodium (COLACE) 100 MG capsule Take 300 mg by mouth at bedtime.     . fluticasone (FLONASE) 50 MCG/ACT nasal spray SHAKE LIQUID AND USE 2 SPRAYS IN EACH NOSTRIL DAILY 48 g 1  . folic acid (FOLVITE) 1 MG tablet TAKE 1 TABLET(1 MG) BY MOUTH DAILY 100 tablet 5  . furosemide (LASIX) 40 MG tablet Take 1 tablet (40 mg total) by mouth daily. 90 tablet 1  . gabapentin (NEURONTIN) 300 MG capsule Take 1 capsule (300 mg total) by mouth 2 (two) times daily. 60 capsule 2  . hydrALAZINE (APRESOLINE) 25 MG tablet Take 1 tablet  (25 mg total) by mouth 2 (two) times daily. 180 tablet 3  . HYDROcodone-acetaminophen (NORCO) 7.5-325 MG tablet Take 1 tablet by mouth every 6 (six) hours as needed for moderate pain (Must last 30 days.). (Patient not taking: Reported on 11/25/2019) 60 tablet 0  . isosorbide mononitrate (IMDUR) 30 MG 24 hr tablet Take 1 tablet (30 mg total) by mouth daily. 30 tablet 6  . Liniments (SALONPAS PAIN RELIEF PATCH EX) Apply 1 patch topically daily as needed (pain).    . meclizine (ANTIVERT) 12.5 MG tablet Take 1 tablet (12.5 mg total) by mouth 3 (three) times daily as needed for dizziness. (Patient not taking: Reported on 11/25/2019) 30 tablet 2  . metolazone (ZAROXOLYN) 2.5 MG tablet Take one tablet on Saturdays. 12 tablet 5  . metoprolol succinate (TOPROL-XL) 100 MG 24 hr tablet TAKE 1 TABLET BY MOUTH EVERY DAY WITH FOOD OR MILK 90 tablet 1  . Multiple Vitamin (MULTIVITAMIN) LIQD Take 5 mLs by mouth daily.    . nitroGLYCERIN (NITROSTAT) 0.4 MG SL tablet Place 1 tablet (0.4 mg total) under the tongue every 5 (five) minutes as needed for chest pain. (Patient not taking: Reported on 11/25/2019) 30 tablet 0  . ondansetron (ZOFRAN) 4 MG tablet Take 1 tablet (4 mg total) by mouth daily as needed for nausea or vomiting. (Patient not taking: Reported on 11/25/2019) 90 tablet 1  . Polyethyl [name] (SYSTANE) 0.4-0.3 % GEL ophthalmic gel Place 1 application into the left eye at bedtime.    . potassium chloride SA (KLOR-CON) 20 MEQ tablet Take 1 tablet (20 mEq total) by mouth daily. 90 tablet 1  . pravastatin (PRAVACHOL) 40 MG tablet TAKE 1 TABLET BY MOUTH EVERY EVENING 90 tablet 3  . SYNTHROID 75 MCG tablet TAKE 1 TABLET BY MOUTH DAILY 90 tablet 1  . warfarin (JANTOVEN) 5 MG tablet TAKE 2 TABLETS EVERY DAY EXCEPT 1 TABLET ON SATURDAY 60 tablet 0   No current facility-administered medications for this visit.   Facility-Administered Medications Ordered in Other Visits  Medication Dose Route Frequency  Provider Last Rate Last Admin  . epoetin alfa (EPOGEN,PROCRIT) injection 40,000 Units  40,000 Units Subcutaneous Once Farrel Gobble, MD  Allergies:   Penicillins, Aspirin, Fentanyl, and Niacin   Social History:  The patient  reports that she quit smoking about 32 years ago. Her smoking use included cigarettes. She has a 30.00 pack-year smoking history. She has never used smokeless tobacco. She reports that she does not drink alcohol or use drugs.   Family History:  The patient's family history includes Diabetes in her brother and brother; Heart attack in her sister; Heart disease in her brother, brother, father, and sister; Hypertension in her brother; Lung cancer in her brother; Pancreatic cancer in her mother.  ROS:  Please see the history of present illness. All other systems are reviewed and otherwise negative.   PHYSICAL EXAM:  VS:  There were no vitals taken for this visit. BMI: There is no height or weight on file to calculate BMI. Well nourished, well developed, in no acute distress  HEENT: normocephalic, atraumatic  Neck: no JVD, carotid bruits or masses Cardiac: *** RRR; Valves apprciead, no rubs, or gallops Lungs:  *** CTA b/l, no wheezing, rhonchi or rales  Abd: soft, nontender, obese MS: no deformity, age appropriate atrophy Ext: *** trace edema  Skin: warm and dry, no rash Neuro:  No gross deficits appreciated Psych: euthymic mood, full affect  *** PPM site is stable, no tethering or discomfort   EKG:  Not done today  PPM interrogation done today and reviewed by myself:  ***  03/18/2019 TTE IMPRESSIONS  1. A St. Jude valve is present in the aortic position. It is functioning normally.  2. A St. Jude valve is present in the mitral position. It is functioning normally.  3. The left ventricle has a visually estimated ejection fraction of 55%. The cavity size was normal. There is mild concentric left ventricular hypertrophy. Left ventricular diastolic  Doppler parameters are indeterminate due to mitral valve  replacement/repair.  4. The aortic root is normal in size and structure.  5. The tricuspid valve is grossly normal. Tricuspid valve regurgitation is moderate.  6. Trivial pericardial effusion is present.  7. Left atrial size was severely dilated.  8. Right atrial size was moderately dilated.  9. The inferior vena cava was dilated in size with >50% respiratory variability.    Recent Labs: 03/20/2019: B Natriuretic Peptide 497.0 07/07/2019: TSH 0.795 11/25/2019: ALT 14; BUN 44; BUN 45; Creatinine, Ser 1.47; Creatinine, Ser 1.53; Hemoglobin 9.9; Platelets 177; Potassium 5.0; Potassium 5.0; Sodium 138; Sodium 137  07/07/2019: Cholesterol 123; HDL 48; LDL Cholesterol 63; Total CHOL/HDL Ratio 2.6; Triglycerides 61; VLDL 12   Estimated Creatinine Clearance: 32.1 mL/min (A) (by C-G formula based on SCr of 1.47 mg/dL (H)).   Wt Readings from Last 3 Encounters:  11/25/19 196 lb 12.8 oz (89.3 kg)  11/24/19 199 lb (90.3 kg)  11/17/19 184 lb (83.5 kg)     Other studies reviewed: Additional studies/records reviewed today include: summarized above  ASSESSMENT AND PLAN:  1. CRT-P     LV lead threshold is trending upwards     ***       2. VHD     W/ mechanical AVR and MVR      Echo in June both functioning normally 3. Permanent AFib      Warfarin monitored and managed with the coumadin clinic       She denies any bleeding or signs of bleeding      H/H looks stable by recent labs  4. CP     *** No an ongoing complaint  C/w Dr. Sallyanne Kuster  5. HTN     ***     Reports lower at home     They are asked to keep an eye on it  6. NICM     Optivol is well below threshold     No exam findings to suggest overt volume OL     ***      Echo in June LVEF 55%     *** % BV pacing (effective)     Disposition: ***    Current medicines are reviewed at length with the patient today.  The patient did not have any concerns regarding  medicines.  Venetia Night, PA-C 11/30/2019 8:57 AM     [address] [place] [phone] (office)  [phone] (fax)

## 2019-12-02 ENCOUNTER — Ambulatory Visit: Payer: Medicare Other | Admitting: Physician Assistant

## 2019-12-02 ENCOUNTER — Telehealth: Payer: Self-pay | Admitting: Cardiovascular Disease

## 2019-12-02 NOTE — Telephone Encounter (Signed)
New message   Pt c/o swelling: STAT is pt has developed SOB within 24 hours  1) How much weight have you gained and in what time span? no  2) If swelling, where is the swelling located? legs  3) Are you currently taking a fluid pill? yes  4) Are you currently SOB? no  5) Do you have a log of your daily weights (if so, list)? no  6) Have you gained 3 pounds in a day or 5 pounds in a week?   7) Have you traveled recently? No

## 2019-12-02 NOTE — Telephone Encounter (Signed)
Spoke with patient. Patient reports she has some leg swelling but thinks it's getting better. No other symptoms. Patient has not been weighing daily nor has she been elevating her lower extremities.  Advised patient to weigh daily at the same time in the same way. Informed patient to call back if she gaines 3lbs overnight or if she gains 5lbs in a week.   Patient educated to elevate her lower extremities and to wear compression stockings.   Patient educated to call back if symptoms worsen or if she develops shortness of breath. Patient verbalized understanding.

## 2019-12-04 ENCOUNTER — Ambulatory Visit: Payer: Medicare Other | Attending: Internal Medicine

## 2019-12-04 DIAGNOSIS — Z23 Encounter for immunization: Secondary | ICD-10-CM

## 2019-12-04 NOTE — Progress Notes (Signed)
   Covid-19 Vaccination Clinic  Name:  [name] [name]    MRN: [id number] DOB: 08/03/1932  12/04/2019  [name] [name] was observed post Covid-19 immunization for 15 minutes without incident. She was provided with Vaccine Information Sheet and instruction to access the V-Safe system.   [name] [name] was instructed to call 911 with any severe reactions post vaccine: Marland Kitchen Difficulty breathing  . Swelling of face and throat  . A fast heartbeat  . A bad rash all over body  . Dizziness and weakness   Immunizations Administered    Name Date Dose VIS Date Route   Moderna COVID-19 Vaccine 12/04/2019 10:50 AM 0.5 mL 08/26/2019 Intramuscular   Manufacturer: Moderna   Lot: [id number]   Powers: [id number]

## 2019-12-05 ENCOUNTER — Telehealth: Payer: Self-pay | Admitting: Internal Medicine

## 2019-12-05 NOTE — Telephone Encounter (Signed)
Patient called stating that her pacemaker is going off. States that it has been going off for 3 days.

## 2019-12-05 NOTE — Telephone Encounter (Signed)
Confirmed with patient, her Device did not "go off" or "alarm".   Her monitor (specified that this is the box that is plugged into the wall) has a light that has blinked today.  Re-assured her that this likely means it is updating and is a normal occurrence.   No further questions at this time.   Legrand Como [address]" [place], PA-C  12/05/2019 1:56 PM

## 2019-12-10 ENCOUNTER — Telehealth: Payer: Self-pay | Admitting: *Deleted

## 2019-12-10 NOTE — Telephone Encounter (Signed)
Patient called in with questions about home monitor. Reports that there have been multiple lights lit up in recent weeks and now she cannot wake the monitor up. [employer] tech services number provided. Also provided direct DC number. Pt appreciative and denies questions at this time.

## 2019-12-11 DIAGNOSIS — E119 Type 2 diabetes mellitus without complications: Secondary | ICD-10-CM | POA: Diagnosis not present

## 2019-12-19 DIAGNOSIS — N189 Chronic kidney disease, unspecified: Secondary | ICD-10-CM | POA: Diagnosis not present

## 2019-12-19 DIAGNOSIS — D631 Anemia in chronic kidney disease: Secondary | ICD-10-CM | POA: Diagnosis not present

## 2019-12-19 DIAGNOSIS — R809 Proteinuria, unspecified: Secondary | ICD-10-CM | POA: Diagnosis not present

## 2019-12-19 DIAGNOSIS — E211 Secondary hyperparathyroidism, not elsewhere classified: Secondary | ICD-10-CM | POA: Diagnosis not present

## 2019-12-19 DIAGNOSIS — N1832 Chronic kidney disease, stage 3b: Secondary | ICD-10-CM | POA: Diagnosis not present

## 2019-12-19 DIAGNOSIS — Z79899 Other long term (current) drug therapy: Secondary | ICD-10-CM | POA: Diagnosis not present

## 2019-12-19 DIAGNOSIS — I5032 Chronic diastolic (congestive) heart failure: Secondary | ICD-10-CM | POA: Diagnosis not present

## 2019-12-19 DIAGNOSIS — I129 Hypertensive chronic kidney disease with stage 1 through stage 4 chronic kidney disease, or unspecified chronic kidney disease: Secondary | ICD-10-CM | POA: Diagnosis not present

## 2019-12-23 ENCOUNTER — Inpatient Hospital Stay (HOSPITAL_COMMUNITY): Payer: Medicare Other

## 2019-12-23 ENCOUNTER — Encounter (HOSPITAL_COMMUNITY): Payer: Self-pay

## 2019-12-23 ENCOUNTER — Ambulatory Visit (HOSPITAL_COMMUNITY): Payer: PRIVATE HEALTH INSURANCE

## 2019-12-23 ENCOUNTER — Other Ambulatory Visit: Payer: Self-pay

## 2019-12-23 VITALS — BP 142/68 | HR 70 | Temp 97.3°F | Resp 16

## 2019-12-23 DIAGNOSIS — D649 Anemia, unspecified: Secondary | ICD-10-CM

## 2019-12-23 DIAGNOSIS — D631 Anemia in chronic kidney disease: Secondary | ICD-10-CM | POA: Diagnosis not present

## 2019-12-23 DIAGNOSIS — E1142 Type 2 diabetes mellitus with diabetic polyneuropathy: Secondary | ICD-10-CM | POA: Diagnosis not present

## 2019-12-23 DIAGNOSIS — N1832 Chronic kidney disease, stage 3b: Secondary | ICD-10-CM | POA: Diagnosis not present

## 2019-12-23 DIAGNOSIS — N181 Chronic kidney disease, stage 1: Secondary | ICD-10-CM | POA: Diagnosis not present

## 2019-12-23 DIAGNOSIS — L600 Ingrowing nail: Secondary | ICD-10-CM | POA: Diagnosis not present

## 2019-12-23 DIAGNOSIS — N184 Chronic kidney disease, stage 4 (severe): Secondary | ICD-10-CM

## 2019-12-23 DIAGNOSIS — E611 Iron deficiency: Secondary | ICD-10-CM

## 2019-12-23 DIAGNOSIS — M79674 Pain in right toe(s): Secondary | ICD-10-CM | POA: Diagnosis not present

## 2019-12-23 LAB — CBC
HCT: 30.6 % — ABNORMAL LOW (ref 36.0–46.0)
Hemoglobin: 9.3 g/dL — ABNORMAL LOW (ref 12.0–15.0)
MCH: 29.5 pg (ref 26.0–34.0)
MCHC: 30.4 g/dL (ref 30.0–36.0)
MCV: 97.1 fL (ref 80.0–100.0)
Platelets: 147 10*3/uL — ABNORMAL LOW (ref 150–400)
RBC: 3.15 MIL/uL — ABNORMAL LOW (ref 3.87–5.11)
RDW: 16.4 % — ABNORMAL HIGH (ref 11.5–15.5)
WBC: 5.5 10*3/uL (ref 4.0–10.5)
nRBC: 0 % (ref 0.0–0.2)

## 2019-12-23 MED ORDER — EPOETIN ALFA-EPBX 40000 UNIT/ML IJ SOLN
INTRAMUSCULAR | Status: AC
  Filled 2019-12-23: qty 1

## 2019-12-23 MED ORDER — EPOETIN ALFA-EPBX 40000 UNIT/ML IJ SOLN
40000.0000 [IU] | Freq: Once | INTRAMUSCULAR | Status: AC
  Administered 2019-12-23: 40000 [IU] via SUBCUTANEOUS

## 2019-12-23 NOTE — Progress Notes (Signed)
Retacrit given per orders. Patient tolerated it well without problems. Vitals stable and discharged home from clinic via wheelchair. Follow up as scheduled.

## 2019-12-29 ENCOUNTER — Other Ambulatory Visit: Payer: Self-pay | Admitting: Family Medicine

## 2019-12-29 ENCOUNTER — Other Ambulatory Visit: Payer: Self-pay

## 2019-12-29 ENCOUNTER — Ambulatory Visit (INDEPENDENT_AMBULATORY_CARE_PROVIDER_SITE_OTHER): Payer: Medicare Other

## 2019-12-29 ENCOUNTER — Telehealth: Payer: Self-pay

## 2019-12-29 DIAGNOSIS — I5032 Chronic diastolic (congestive) heart failure: Secondary | ICD-10-CM | POA: Diagnosis not present

## 2019-12-29 DIAGNOSIS — Z950 Presence of cardiac pacemaker: Secondary | ICD-10-CM

## 2019-12-29 MED ORDER — ISOSORBIDE MONONITRATE ER 30 MG PO TB24: 30.0000 mg | ORAL_TABLET | Freq: Every day | ORAL | 6 refills | Status: DC

## 2019-12-29 NOTE — Telephone Encounter (Signed)
Remote ICM transmission received.  Attempted call to patient regarding ICM remote transmission and left message to return call   

## 2019-12-29 NOTE — Progress Notes (Signed)
EPIC Encounter for ICM Monitoring  Patient Name: [name] [name] is a 86 y.o. female Date: 12/29/2019 Primary Care Physican: Fayrene Helper, MD Primary Cardiologist:Croitoru Electrophysiologist:Taylor Bi-V Pacing:99.6% 11/24/2019 OfficeWeight:199lbs   Spoke with patient. She reports being a little SOB today but she did miss a Furosemide dosage yesterday.  She took Metolazone on Saturday as prescribed.  She also has inhalers to help with breathing.   Optivol thoracic impedancenormal.  Prescribed:  Furosemide40 mg 1 tablet daily.   Metolazaone 2.5 mg Take 1tablet oncea week on Saturdays.   Potassium 20 mEq take 1 tablet daily.  Labs: 11/25/2019 Creatinine 1.47, BUN 44, Potassium 5.0, Sodium 138, GFR 32-37 09/30/2019 Creatinine 1.68, BUN 50, Potassium 4.8, Sodium 139, GFR 27-32 08/04/2019 Creatinine 1.45, BUN 38, Potassium 3.9, Sodium 135, GFR 33-38 08/02/2019 Creatinine 1.44, BUN 43, Potassium 4.3, Sodium 134, GFR 33-38 07/07/2019 Creatinine 1.82, BUN 54, Potassium 4.4, Sodium 132, GFR 25-29 05/31/2019 Creatinine1.48, BUN45, Potassium4.4, Sodium132, GFR32-37 05/30/2019 Creatinine1.54, BUN47, Potassium4.2, Sodium131, GFR30-35  05/29/2019 Creatinine1.86, BUN50, Potassium4.6, Sodium129, XJD55-20 A complete set of results can be found in Results Review.  Recommendations:  No changes and encouraged to call if experiencing any fluid symptoms.  Follow-up plan: ICM clinic phone appointment on5/06/2020.91 day device clinic remote transmission5/21/2021.  Copy of ICM check sent to Dr.Taylor.   3 month ICM trend: 12/29/2019    1 Year ICM trend:       [name], RN 12/29/2019 12:48 PM

## 2019-12-31 ENCOUNTER — Ambulatory Visit (INDEPENDENT_AMBULATORY_CARE_PROVIDER_SITE_OTHER): Payer: Medicare Other | Admitting: *Deleted

## 2019-12-31 DIAGNOSIS — Z952 Presence of prosthetic heart valve: Secondary | ICD-10-CM

## 2019-12-31 DIAGNOSIS — Z5181 Encounter for therapeutic drug level monitoring: Secondary | ICD-10-CM

## 2019-12-31 LAB — POCT INR: INR: 2.3 (ref 2.0–3.0)

## 2019-12-31 NOTE — Patient Instructions (Signed)
Continue warfarin 2 tablets daily except 1 tablet on Tuesdays  Continue greens Recheck in 6 weeks

## 2020-01-02 ENCOUNTER — Other Ambulatory Visit: Payer: Self-pay

## 2020-01-02 ENCOUNTER — Emergency Department (HOSPITAL_COMMUNITY)
Admission: EM | Admit: 2020-01-02 | Discharge: 2020-01-02 | Disposition: A | Discharge status: Home / Self Care | Payer: Medicare Other | Attending: Emergency Medicine | Admitting: Emergency Medicine

## 2020-01-02 ENCOUNTER — Emergency Department (HOSPITAL_COMMUNITY): Payer: Medicare Other

## 2020-01-02 ENCOUNTER — Encounter (HOSPITAL_COMMUNITY): Payer: Self-pay

## 2020-01-02 DIAGNOSIS — N184 Chronic kidney disease, stage 4 (severe): Secondary | ICD-10-CM | POA: Diagnosis not present

## 2020-01-02 DIAGNOSIS — J449 Chronic obstructive pulmonary disease, unspecified: Secondary | ICD-10-CM | POA: Diagnosis not present

## 2020-01-02 DIAGNOSIS — Z87891 Personal history of nicotine dependence: Secondary | ICD-10-CM | POA: Insufficient documentation

## 2020-01-02 DIAGNOSIS — E1122 Type 2 diabetes mellitus with diabetic chronic kidney disease: Secondary | ICD-10-CM | POA: Insufficient documentation

## 2020-01-02 DIAGNOSIS — R0602 Shortness of breath: Secondary | ICD-10-CM | POA: Diagnosis not present

## 2020-01-02 DIAGNOSIS — J4521 Mild intermittent asthma with (acute) exacerbation: Secondary | ICD-10-CM | POA: Diagnosis not present

## 2020-01-02 DIAGNOSIS — E039 Hypothyroidism, unspecified: Secondary | ICD-10-CM | POA: Diagnosis not present

## 2020-01-02 DIAGNOSIS — Z8616 Personal history of COVID-19: Secondary | ICD-10-CM | POA: Insufficient documentation

## 2020-01-02 DIAGNOSIS — Z7901 Long term (current) use of anticoagulants: Secondary | ICD-10-CM | POA: Diagnosis not present

## 2020-01-02 DIAGNOSIS — Z85850 Personal history of malignant neoplasm of thyroid: Secondary | ICD-10-CM | POA: Insufficient documentation

## 2020-01-02 DIAGNOSIS — Z20822 Contact with and (suspected) exposure to covid-19: Secondary | ICD-10-CM | POA: Diagnosis not present

## 2020-01-02 DIAGNOSIS — I130 Hypertensive heart and chronic kidney disease with heart failure and stage 1 through stage 4 chronic kidney disease, or unspecified chronic kidney disease: Secondary | ICD-10-CM | POA: Diagnosis not present

## 2020-01-02 DIAGNOSIS — Z79899 Other long term (current) drug therapy: Secondary | ICD-10-CM | POA: Diagnosis not present

## 2020-01-02 DIAGNOSIS — I5032 Chronic diastolic (congestive) heart failure: Secondary | ICD-10-CM | POA: Diagnosis not present

## 2020-01-02 DIAGNOSIS — R062 Wheezing: Secondary | ICD-10-CM | POA: Diagnosis present

## 2020-01-02 LAB — COMPREHENSIVE METABOLIC PANEL
ALT: 13 U/L (ref 0–44)
AST: 22 U/L (ref 15–41)
Albumin: 3.7 g/dL (ref 3.5–5.0)
Alkaline Phosphatase: 52 U/L (ref 38–126)
Anion gap: 7 (ref 5–15)
BUN: 43 mg/dL — ABNORMAL HIGH (ref 8–23)
CO2: 29 mmol/L (ref 22–32)
Calcium: 9.1 mg/dL (ref 8.9–10.3)
Chloride: 98 mmol/L (ref 98–111)
Creatinine, Ser: 1.46 mg/dL — ABNORMAL HIGH (ref 0.44–1.00)
GFR calc Af Amer: 38 mL/min — ABNORMAL LOW (ref >60)
GFR calc non Af Amer: 32 mL/min — ABNORMAL LOW (ref >60)
Glucose, Bld: 96 mg/dL (ref 70–99)
Potassium: 4.6 mmol/L (ref 3.5–5.1)
Sodium: 134 mmol/L — ABNORMAL LOW (ref 135–145)
Total Bilirubin: 0.5 mg/dL (ref 0.3–1.2)
Total Protein: 7.7 g/dL (ref 6.5–8.1)

## 2020-01-02 LAB — CBC WITH DIFFERENTIAL/PLATELET
Abs Immature Granulocytes: 0.03 10*3/uL (ref 0.00–0.07)
Basophils Absolute: 0 10*3/uL (ref 0.0–0.1)
Basophils Relative: 0 %
Eosinophils Absolute: 0 10*3/uL (ref 0.0–0.5)
Eosinophils Relative: 0 %
HCT: 30 % — ABNORMAL LOW (ref 36.0–46.0)
Hemoglobin: 9.2 g/dL — ABNORMAL LOW (ref 12.0–15.0)
Immature Granulocytes: 1 %
Lymphocytes Relative: 12 %
Lymphs Abs: 0.7 10*3/uL (ref 0.7–4.0)
MCH: 30 pg (ref 26.0–34.0)
MCHC: 30.7 g/dL (ref 30.0–36.0)
MCV: 97.7 fL (ref 80.0–100.0)
Monocytes Absolute: 0.4 10*3/uL (ref 0.1–1.0)
Monocytes Relative: 8 %
Neutro Abs: 4.2 10*3/uL (ref 1.7–7.7)
Neutrophils Relative %: 79 %
Platelets: 179 10*3/uL (ref 150–400)
RBC: 3.07 MIL/uL — ABNORMAL LOW (ref 3.87–5.11)
RDW: 16.9 % — ABNORMAL HIGH (ref 11.5–15.5)
WBC: 5.3 10*3/uL (ref 4.0–10.5)
nRBC: 0 % (ref 0.0–0.2)

## 2020-01-02 LAB — BRAIN NATRIURETIC PEPTIDE: B Natriuretic Peptide: 516 pg/mL — ABNORMAL HIGH (ref 0.0–100.0)

## 2020-01-02 MED ORDER — ALBUTEROL SULFATE HFA 108 (90 BASE) MCG/ACT IN AERS
2.0000 | INHALATION_SPRAY | Freq: Once | RESPIRATORY_TRACT | Status: AC
  Administered 2020-01-02: 14:00:00 2 via RESPIRATORY_TRACT
  Filled 2020-01-02: qty 6.7

## 2020-01-02 MED ORDER — DOXYCYCLINE HYCLATE 100 MG PO CAPS: 100.0000 mg | ORAL_CAPSULE | Freq: Two times a day (BID) | ORAL | 0 refills | Status: DC

## 2020-01-02 MED ORDER — CEPHALEXIN 500 MG PO CAPS: 500.0000 mg | ORAL_CAPSULE | Freq: Three times a day (TID) | ORAL | 0 refills | Status: DC

## 2020-01-02 NOTE — ED Provider Notes (Signed)
[facility] EMERGENCY DEPARTMENT Provider Note   CSN: [id number] Arrival date & time: 01/02/20  1146     History Chief Complaint  Patient presents with  . Wheezing    [name] [name] is a 86 y.o. female.  Patient complains of some wheezing.  Patient has history of COPD and heart failure.  The history is provided by the patient and medical records. No language interpreter was used.  Wheezing Severity:  Mild Severity compared to prior episodes:  Similar Onset quality:  Sudden Timing:  Intermittent Progression:  Waxing and waning Chronicity:  New Relieved by:  Nothing Worsened by:  Nothing Ineffective treatments:  None tried Associated symptoms: no chest pain, no cough, no fatigue, no headaches and no rash   Risk factors: not exposed to toxic fumes        Past Medical History:  Diagnosis Date  . Adenomatous polyp of colon 12/16/2002   Dr. [name]/[facility]  . Allergy   . Cataract   . CHB (complete heart block) ([place]) 10/07/2014      . CHF (congestive heart failure) ([place])    a.  EF previously reduced to 25-30% b. EF improved to 60-65% by echo in 10/2017.   Marland Kitchen Chronic kidney disease, stage I 2006  . Constipation       . COPD (chronic obstructive pulmonary disease) ([place])       . DJD (degenerative joint disease) of lumbar spine   . DM (diabetes mellitus) ([place]) 2006  . Esophageal reflux   . Glaucoma   . Gout       . H/O heart valve replacement with mechanical valve    a. s/p mechanical AVR in 2001 and mechanical MVR in 2004.  Marland Kitchen H/O wisdom tooth extraction   . Hyperlipemia   . IDA (iron deficiency anemia)    Parenteral iron/[name]  . Insomnia       . Non-ischemic cardiomyopathy ([place])    a. s/p MDT CRTD  . Obesity, unspecified   . Other and unspecified hyperlipidemia   . Oxygen deficiency 2014   nocturnal;  . Spondylolisthesis   . Spondylosis   . Thyroid cancer ([place])    remote thyroidectomy, no recurrence, pt denies in  2016 thyroid cancer  . Type 2 diabetes mellitus with hyperglycemia ([place]) 07/15/2010  . Unspecified hypothyroidism       . 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 Vertigo         Patient Active Problem List   Diagnosis Date Noted  . COVID-19 virus detected 11/23/2019  . Dyspnea 05/30/2019  . Anemia 05/30/2019  . Gout   . Glaucoma   . GERD (gastroesophageal reflux disease)   . Physical debility 04/28/2019  . Encounter for examination following treatment at hospital 11/03/2018  . Hyponatremia 09/27/2018  . H/O mitral valve replacement with mechanical valve 08/02/2018  . Chronic right hip pain 04/16/2018  . Trochanteric bursitis, right hip 03/07/2017  . Persistent atrial fibrillation ([place])   . Nocturnal hypoxia 10/08/2016  . Dependence on nocturnal oxygen therapy 10/08/2016  . Hematuria 03/20/2016  . S/P ICD (internal cardiac defibrillator) procedure 02/08/2015  . Presence of cardiac pacemaker 02/08/2015  . CHB (complete heart block) ([place]) 10/07/2014  . Fatigue 07/20/2014  . Absolute anemia 06/24/2014  . Osteopenia 02/10/2014  . Encounter for therapeutic drug monitoring 10/29/2013  . Spinal stenosis of lumbar region 02/19/2013  . OA (osteoarthritis) of knee 02/19/2013  . Chronic pain of right knee  01/02/2013  . Hemolytic anemia ([place]) 09/24/2012  . H/O adenomatous polyp of colon 05/24/2012  . High risk medication use 05/24/2012  . COPD (chronic obstructive pulmonary disease) ([place]) 05/19/2012  . Acute on chronic diastolic CHF (congestive heart failure) ([place]) 05/19/2012  . Chronic anticoagulation, on coumadin for Mechanical AVR and MVR 04/01/2012  . Cardiorenal syndrome with renal failure 03/22/2012  . Hx of aortic valve replacement, mechanical 03/22/2012  . S/P MVR (mitral valve replacement) 03/22/2012  . Biventricular automatic implantable cardioverter defibrillator in situ 03/22/2012  . CKD (chronic kidney disease), stage IV ([place]) 03/22/2012  .  Warfarin-induced coagulopathy ([place]) 03/21/2012  . Papilloma of breast 03/06/2012  . Iron deficiency 10/04/2011  . Allergic rhinitis 02/14/2011  . Hypothyroidism 07/15/2010  . Mitral valve disease 07/15/2010  . Sinus node dysfunction ([place]) 07/15/2010  . Nausea without vomiting 06/07/2010  . HIP PAIN, RIGHT 04/23/2009  . VARICOSE VEINS LOWER EXTREMITIES W/OTH COMPS 02/09/2009  . Calculus of gallbladder with chronic cholecystitis without obstruction 02/09/2009  . Prediabetes 06/15/2008  . Hypothyroidism, postsurgical 02/24/2008  . Hyperlipidemia LDL goal <70 02/24/2008  . Essential hypertension 02/24/2008  . Non-ischemic cardiomyopathy with ICD 02/24/2008  . GERD 02/24/2008  . DEGENERATIVE JOINT DISEASE, SPINE 02/24/2008    Past Surgical History:  Procedure Laterality Date  . AORTIC AND MITRAL VALVE REPLACEMENT  2001  . BI-VENTRICULAR IMPLANTABLE CARDIOVERTER DEFIBRILLATOR UPGRADE N/A 01/18/2015   Procedure: BI-VENTRICULAR IMPLANTABLE CARDIOVERTER DEFIBRILLATOR UPGRADE;  Surgeon: Evans Lance, MD;  Location: [facility] CATH LAB;  Service: Cardiovascular;  Laterality: N/A;  . BIV ICD GENERATOR CHANGEOUT N/A 01/30/2019   Procedure: BIV ICD GENERATOR CHANGEOUT;  Surgeon: Evans Lance, MD;  Location: [facility] CV LAB;  Service: Cardiovascular;  Laterality: N/A;  . CARDIAC CATHETERIZATION    . CARDIAC VALVE REPLACEMENT    . CARDIOVERSION N/A 11/13/2016   Procedure: CARDIOVERSION;  Surgeon: Sanda Klein, MD;  Location: [facility] ENDOSCOPY;  Service: Cardiovascular;  Laterality: N/A;  . COLONOSCOPY  06/12/2007   Rourk- Normal rectum/Normal colon  . COLONOSCOPY  12/16/02   small hemorrhoids  . COLONOSCOPY  06/20/2012   Procedure: COLONOSCOPY;  Surgeon: Daneil Dolin, MD;  Location: [facility] ENDO SUITE;  Service: Endoscopy;  Laterality: N/A;  10:45  . defibrillator implanted 2006    . DOPPLER ECHOCARDIOGRAPHY  2012  . DOPPLER ECHOCARDIOGRAPHY  05,06,07,08,09,2011  . ESOPHAGOGASTRODUODENOSCOPY  06/12/2007     Rourk- normal esophagus, small hiatal hernia, otherwise normal stomach, D1, D2  . EYE SURGERY Right 2005  . EYE SURGERY Left 2006  . ICD LEAD REMOVAL Left 02/08/2015   Procedure: ICD LEAD REMOVAL;  Surgeon: Evans Lance, MD;  Location: [facility] OR;  Service: Cardiovascular;  Laterality: Left;  "Will plan extraction and insertion of a BiV PM"  **Dr. Roxan Hockey backing up case**  . IMPLANTABLE CARDIOVERTER DEFIBRILLATOR (ICD) GENERATOR CHANGE Left 02/08/2015   Procedure: ICD GENERATOR CHANGE;  Surgeon: Evans Lance, MD;  Location: [facility];  Service: Cardiovascular;  Laterality: Left;  . INSERT / REPLACE / REMOVE PACEMAKER    . PACEMAKER INSERTION  May 2016  . pacemaker placed  2004  . right breast cyst removed     benign   . right cataract removed     2005  . TEE WITHOUT CARDIOVERSION N/A 11/13/2016   Procedure: TRANSESOPHAGEAL ECHOCARDIOGRAM (TEE);  Surgeon: Sanda Klein, MD;  Location: [facility] ENDOSCOPY;  Service: Cardiovascular;  Laterality: N/A;  . THYROIDECTOMY       OB History    Durenda Age  Para      Term      Preterm      AB      Living  0     SAB      TAB      Ectopic      Multiple      Live Births              Family History  Problem Relation Age of Onset  . Pancreatic cancer Mother   . Heart disease Father   . Heart disease Sister   . Heart attack Sister   . Heart disease Brother   . Diabetes Brother   . Heart disease Brother   . Diabetes Brother   . Hypertension Brother   . Lung cancer Brother     Social History   Tobacco Use  . Smoking status: Former Smoker    Packs/day: 0.75    Years: 40.00    Pack years: 30.00    Types: Cigarettes    Quit date: 03/08/1987    Years since quitting: 32.8  . Smokeless tobacco: Never Used  Substance Use Topics  . Alcohol use: No    Alcohol/week: 0.0 standard drinks  . Drug use: No    Home Medications Prior to Admission medications   Medication Sig Start Date End Date Taking? Authorizing Provider   acetaminophen (TYLENOL) 500 MG tablet Take 500 mg by mouth every 6 (six) hours as needed (pain).    [provider]  albuterol (PROVENTIL) (2.5 MG/3ML) 0.083% nebulizer solution 90INHALE 1 VIAL VIA NEBULIZER THREE TIMES DAILY. Patient not taking: Reported on 11/25/2019 09/02/19   Fayrene Helper, MD  albuterol (VENTOLIN HFA) 108 (90 Base) MCG/ACT inhaler INHALE 2 PUFFS INTO THE LUNGS EVERY 6 HOURS AS NEEDED FOR WHEEZING OR SHORTNESS OF BREATH Patient not taking: Reported on 11/25/2019 08/25/19   Fayrene Helper, MD  allopurinol (ZYLOPRIM) 100 MG tablet TAKE 1 TABLET(100 MG) BY MOUTH DAILY 12/30/19   Fayrene Helper, MD  benzonatate (TESSALON) 100 MG capsule Take 1 capsule (100 mg total) by mouth 2 (two) times daily as needed for cough. Patient not taking: Reported on 11/25/2019 10/22/19   Fayrene Helper, MD  BREO ELLIPTA 100-25 MCG/INH AEPB 1 TAKE 1 INHALATION BY MOUTH ONCE DAILY 08/25/19   Fayrene Helper, MD  cephALEXin (KEFLEX) 500 MG capsule Take 1 capsule (500 mg total) by mouth 3 (three) times daily. 01/02/20   Milton Ferguson, MD  cholecalciferol (VITAMIN D3) 25 MCG (1000 UT) tablet Take 1,000 Units by mouth daily.    [provider]  docusate sodium (COLACE) 100 MG capsule Take 300 mg by mouth at bedtime.     [provider]  fluticasone (FLONASE) 50 MCG/ACT nasal spray SHAKE LIQUID AND USE 2 SPRAYS IN EACH NOSTRIL DAILY 11/24/19   Fayrene Helper, MD  folic acid (FOLVITE) 1 MG tablet TAKE 1 TABLET(1 MG) BY MOUTH DAILY 11/10/19   Fayrene Helper, MD  furosemide (LASIX) 40 MG tablet Take 1 tablet (40 mg total) by mouth daily. 08/19/19   Fayrene Helper, MD  gabapentin (NEURONTIN) 300 MG capsule Take 1 capsule (300 mg total) by mouth 2 (two) times daily. 05/31/19   Barton Dubois, MD  hydrALAZINE (APRESOLINE) 25 MG tablet Take 1 tablet (25 mg total) by mouth 2 (two) times daily. 05/19/19   Croitoru, Mihai, MD  HYDROcodone-acetaminophen (NORCO)  7.5-325 MG tablet Take 1 tablet by mouth every 6 (six) hours as needed for  moderate pain (Must last 30 days.). Patient not taking: Reported on 11/25/2019 08/19/19   Sanjuana Kava, MD  isosorbide mononitrate (IMDUR) 30 MG 24 hr tablet Take 1 tablet (30 mg total) by mouth daily. 12/29/19 03/28/20  Croitoru, Mihai, MD  Liniments (SALONPAS PAIN RELIEF PATCH EX) Apply 1 patch topically daily as needed (pain).    [provider]  meclizine (ANTIVERT) 12.5 MG tablet Take 1 tablet (12.5 mg total) by mouth 3 (three) times daily as needed for dizziness. Patient not taking: Reported on 11/25/2019 09/02/19   Fayrene Helper, MD  metolazone (ZAROXOLYN) 2.5 MG tablet Take one tablet on Saturdays. 11/24/19   Croitoru, Mihai, MD  metoprolol succinate (TOPROL-XL) 100 MG 24 hr tablet TAKE 1 TABLET BY MOUTH EVERY DAY WITH FOOD OR MILK 11/17/19   Fayrene Helper, MD  Multiple Vitamin (MULTIVITAMIN) LIQD Take 5 mLs by mouth daily.    [provider]  nitroGLYCERIN (NITROSTAT) 0.4 MG SL tablet Place 1 tablet (0.4 mg total) under the tongue every 5 (five) minutes as needed for chest pain. Patient not taking: Reported on 11/25/2019 08/03/19   Rolland Porter, MD  ondansetron (ZOFRAN) 4 MG tablet Take 1 tablet (4 mg total) by mouth daily as needed for nausea or vomiting. Patient not taking: Reported on 11/25/2019 08/19/19   Fayrene Helper, MD  Polyethyl [name] (SYSTANE) 0.4-0.3 % GEL ophthalmic gel Place 1 application into the left eye at bedtime.    [provider]  potassium chloride SA (KLOR-CON) 20 MEQ tablet Take 1 tablet (20 mEq total) by mouth daily. 08/01/19   Fayrene Helper, MD  pravastatin (PRAVACHOL) 40 MG tablet TAKE 1 TABLET BY MOUTH EVERY EVENING 11/10/19   Fayrene Helper, MD  SYNTHROID 75 MCG tablet TAKE 1 TABLET BY MOUTH DAILY 10/27/19   Fayrene Helper, MD  warfarin (JANTOVEN) 5 MG tablet TAKE 2 TABLETS EVERY DAY EXCEPT 1 TABLET ON SATURDAY 11/10/19   Evans Lance, MD    Allergies    Penicillins, Aspirin, Fentanyl, and Niacin  Review of Systems   Review of Systems  Constitutional: Negative for appetite change and fatigue.  HENT: Negative for congestion, ear discharge and sinus pressure.   Eyes: Negative for discharge.  Respiratory: Positive for wheezing. Negative for cough.   Cardiovascular: Negative for chest pain.  Gastrointestinal: Negative for abdominal pain and diarrhea.  Genitourinary: Negative for frequency and hematuria.  Musculoskeletal: Negative for back pain.  Skin: Negative for rash.  Neurological: Negative for seizures and headaches.  Psychiatric/Behavioral: Negative for hallucinations.    Physical Exam Updated Vital Signs BP (!) 183/70   Pulse (!) 59   Temp (!) 97.5 F (36.4 C) (Oral)   Resp (!) 30   Ht 5\' 7"  (1.702 m)   Wt 86.6 kg   SpO2 96%   BMI 29.91 kg/m   Physical Exam Vitals and nursing note reviewed.  Constitutional:      Appearance: She is well-developed.  HENT:     Head: Normocephalic.     Nose: Nose normal.  Eyes:     General: No scleral icterus.    Conjunctiva/sclera: Conjunctivae normal.  Neck:     Thyroid: No thyromegaly.  Cardiovascular:     Rate and Rhythm: Normal rate and regular rhythm.     Heart sounds: No murmur. No friction rub. No gallop.   Pulmonary:     Breath sounds: No stridor. Wheezing present. No rales.  Chest:     Chest wall:  No tenderness.  Abdominal:     General: There is no distension.     Tenderness: There is no abdominal tenderness. There is no rebound.  Musculoskeletal:        General: Normal range of motion.     Cervical back: Neck supple.  Lymphadenopathy:     Cervical: No cervical adenopathy.  Skin:    Findings: No erythema or rash.  Neurological:     Mental Status: She is alert and oriented to person, place, and time.     Motor: No abnormal muscle tone.     Coordination: Coordination normal.  Psychiatric:        Behavior: Behavior normal.     ED  Results / Procedures / Treatments   Labs (all labs ordered are listed, but only abnormal results are displayed) Labs Reviewed  CBC WITH DIFFERENTIAL/PLATELET - Abnormal; Notable for the following components:      Result Value   RBC 3.07 (*)    Hemoglobin 9.2 (*)    HCT 30.0 (*)    RDW 16.9 (*)    All other components within normal limits  COMPREHENSIVE METABOLIC PANEL - Abnormal; Notable for the following components:   Sodium 134 (*)    BUN 43 (*)    Creatinine, Ser 1.46 (*)    GFR calc non Af Amer 32 (*)    GFR calc Af Amer 38 (*)    All other components within normal limits  BRAIN NATRIURETIC PEPTIDE - Abnormal; Notable for the following components:   B Natriuretic Peptide 516.0 (*)    All other components within normal limits  SARS CORONAVIRUS 2 (TAT 6-24 HRS)    EKG None  Radiology DG Chest Port 1 View  Result Date: 01/02/2020 CLINICAL DATA:  Shortness of breath EXAM: PORTABLE CHEST 1 VIEW COMPARISON:  08/02/2019 FINDINGS: Chronic interstitial prominence with reticular opacities. No pleural effusion or pneumothorax. Stable cardiomegaly and enlargement of the main pulmonary artery. Aortic and mitral valve replacements and left chest wall biventricular ICD. IMPRESSION: Chronic interstitial changes. Cardiomegaly. Pulmonary arterial hypertension. Electronically Signed   By: Macy Mis M.D.   On: 01/02/2020 14:07    Procedures Procedures (including critical care time)  Medications Ordered in ED Medications  albuterol (VENTOLIN HFA) 108 (90 Base) MCG/ACT inhaler 2 puff (2 puffs Inhalation Given 01/02/20 1404)    ED Course  I have reviewed the triage vital signs and the nursing notes.  Pertinent labs & imaging results that were available during my care of the patient were reviewed by me and considered in my medical decision making (see chart for details).    MDM Rules/Calculators/A&P                       Patient improved mildly with albuterol.  She will continue with  her albuterol and take her Lasix as prescribed and follow-up with her doctor.  She will be placed on Keflex for possible infection.      This patient presents to the ED for concern of wheezing this involves an extensive number of treatment options, and is a complaint that carries with it a high risk of complications and morbidity.  The differential diagnosis includes COPD pulmonary infection and congestive heart failure   Lab Tests:   I Ordered, reviewed, and interpreted labs, which included CBC which shows chronic anemia chemistry studies showed renal deficiency and BMP which showed pretty stable congestive heart failure  Medicines ordered:   I ordered medication albuterol inhaler  for wheezing  Imaging Studies ordered:   I ordered imaging studies which included chest x-ray and  I independently visualized and interpreted imaging which showed stable cardiomegaly  Additional history obtained:   Additional history obtained from old records  Previous records obtained and reviewed   Consultations Obtained:   Reevaluation:  After the interventions stated above, I reevaluated the patient and found patient improved albuterol inhaler.         Patient will be discharged with Keflex and follow-up PCP  Critical Interventions:  .   Final Clinical Impression(s) / ED Diagnoses Final diagnoses:  Mild intermittent asthma with exacerbation    Rx / DC Orders ED Discharge Orders         Ordered    doxycycline (VIBRAMYCIN) 100 MG capsule  2 times daily,   Status:  Discontinued     01/02/20 1427    cephALEXin (KEFLEX) 500 MG capsule  3 times daily     01/02/20 1429           Milton Ferguson, MD 01/02/20 1437

## 2020-01-02 NOTE — Discharge Instructions (Addendum)
Follow-up with your doctor next week for recheck 

## 2020-01-02 NOTE — ED Triage Notes (Signed)
Pt reports she has been wheezing for 4 days. Pt reports usually wheezes when she exerts herself. Pt has an occasional productive cough

## 2020-01-03 LAB — SARS CORONAVIRUS 2 (TAT 6-24 HRS): SARS Coronavirus 2: NEGATIVE

## 2020-01-05 DIAGNOSIS — I10 Essential (primary) hypertension: Secondary | ICD-10-CM | POA: Diagnosis not present

## 2020-01-05 DIAGNOSIS — E890 Postprocedural hypothyroidism: Secondary | ICD-10-CM | POA: Diagnosis not present

## 2020-01-05 DIAGNOSIS — E785 Hyperlipidemia, unspecified: Secondary | ICD-10-CM | POA: Diagnosis not present

## 2020-01-06 ENCOUNTER — Ambulatory Visit: Payer: Medicare Other | Attending: Internal Medicine

## 2020-01-06 ENCOUNTER — Ambulatory Visit: Payer: BC Managed Care – PPO

## 2020-01-06 DIAGNOSIS — Z23 Encounter for immunization: Secondary | ICD-10-CM

## 2020-01-06 LAB — COMPLETE METABOLIC PANEL WITHOUT GFR
AG Ratio: 1.1 (calc) (ref 1.0–2.5)
ALT: 9 U/L (ref 6–29)
AST: 20 U/L (ref 10–35)
Albumin: 3.8 g/dL (ref 3.6–5.1)
Alkaline phosphatase (APISO): 50 U/L (ref 37–153)
BUN/Creatinine Ratio: 28 (calc) — ABNORMAL HIGH (ref 6–22)
BUN: 44 mg/dL — ABNORMAL HIGH (ref 7–25)
CO2: 31 mmol/L (ref 20–32)
Calcium: 9.7 mg/dL (ref 8.6–10.4)
Chloride: 99 mmol/L (ref 98–110)
Creat: 1.59 mg/dL — ABNORMAL HIGH (ref 0.60–0.88)
GFR, Est African American: 34 mL/min/1.73m2 — ABNORMAL LOW (ref >=60)
GFR, Est Non African American: 29 mL/min/1.73m2 — ABNORMAL LOW (ref >=60)
Globulin: 3.6 g/dL (ref 1.9–3.7)
Glucose, Bld: 90 mg/dL (ref 65–99)
Potassium: 4.2 mmol/L (ref 3.5–5.3)
Sodium: 138 mmol/L (ref 135–146)
Total Bilirubin: 0.6 mg/dL (ref 0.2–1.2)
Total Protein: 7.4 g/dL (ref 6.1–8.1)

## 2020-01-06 LAB — LIPID PANEL
Cholesterol: 124 mg/dL (ref <200)
HDL: 44 mg/dL — ABNORMAL LOW (ref >=50)
LDL Cholesterol (Calc): 65 mg/dL
Non-HDL Cholesterol (Calc): 80 mg/dL (ref <130)
Total CHOL/HDL Ratio: 2.8 (calc) (ref <5.0)
Triglycerides: 73 mg/dL (ref <150)

## 2020-01-06 LAB — TSH: TSH: 1.93 m[IU]/L (ref 0.40–4.50)

## 2020-01-06 NOTE — Progress Notes (Signed)
   Covid-19 Vaccination Clinic  Name:  [name] [name]    MRN: [id number] DOB: Apr 16, 1932  01/06/2020  [name] was observed post Covid-19 immunization for 15 minutes without incident. She was provided with Vaccine Information Sheet and instruction to access the V-Safe system.   [name] was instructed to call 911 with any severe reactions post vaccine: Marland Kitchen Difficulty breathing  . Swelling of face and throat  . A fast heartbeat  . A bad rash all over body  . Dizziness and weakness   Immunizations Administered    Name Date Dose VIS Date Route   Moderna COVID-19 Vaccine 01/06/2020 12:25 PM 0.5 mL 08/26/2019 Intramuscular   Manufacturer: Moderna   Lot: [id number]   Deputy: [id number]

## 2020-01-07 ENCOUNTER — Other Ambulatory Visit: Payer: Self-pay | Admitting: *Deleted

## 2020-01-07 DIAGNOSIS — I714 Abdominal aortic aneurysm, without rupture, unspecified: Secondary | ICD-10-CM

## 2020-01-09 DIAGNOSIS — L600 Ingrowing nail: Secondary | ICD-10-CM | POA: Diagnosis not present

## 2020-01-09 DIAGNOSIS — M79674 Pain in right toe(s): Secondary | ICD-10-CM | POA: Diagnosis not present

## 2020-01-12 ENCOUNTER — Ambulatory Visit: Payer: Medicare Other | Admitting: Family Medicine

## 2020-01-12 ENCOUNTER — Other Ambulatory Visit: Payer: Self-pay

## 2020-01-12 DIAGNOSIS — J441 Chronic obstructive pulmonary disease with (acute) exacerbation: Secondary | ICD-10-CM

## 2020-01-12 MED ORDER — ALBUTEROL SULFATE (2.5 MG/3ML) 0.083% IN NEBU: INHALATION_SOLUTION | RESPIRATORY_TRACT | 3 refills | Status: DC

## 2020-01-13 ENCOUNTER — Telehealth (HOSPITAL_COMMUNITY): Payer: Self-pay

## 2020-01-13 NOTE — Telephone Encounter (Signed)

## 2020-01-14 ENCOUNTER — Ambulatory Visit (HOSPITAL_COMMUNITY)
Admission: RE | Admit: 2020-01-14 | Discharge: 2020-01-14 | Disposition: A | Discharge status: Home / Self Care | Payer: Medicare Other | Source: Ambulatory Visit | Attending: Vascular Surgery | Admitting: Vascular Surgery

## 2020-01-14 ENCOUNTER — Other Ambulatory Visit: Payer: Self-pay

## 2020-01-14 ENCOUNTER — Ambulatory Visit (INDEPENDENT_AMBULATORY_CARE_PROVIDER_SITE_OTHER): Payer: Medicare Other | Admitting: Physician Assistant

## 2020-01-14 DIAGNOSIS — I714 Abdominal aortic aneurysm, without rupture, unspecified: Secondary | ICD-10-CM | POA: Insufficient documentation

## 2020-01-14 NOTE — Progress Notes (Signed)
Established Abdominal Aortic Aneurysm   History of Present Illness   [name] [name] is a 86 y.o. (13-Jun-1932) female who presents with chief complaint: follow up for AAA.  In 2018 abdominal ultrasound demonstrated a 4.2 cm AAA.  Last year we were unable to repeat the same measurement and maximal diameter only measured to be 2.7 cm.  Patient denies any new or changing abdominal or back pain.  Patient states she had a wound on her right great toe after having her toenail trimmed however this has since healed.  She is seen today in a wheelchair and patient states she only walks minimally with a cane due to right knee pain in need of a replacement.  She is on warfarin for atrial fibrillation.    The patient's PMH, PSH, SH, and FamHx were reviewed and are unchanged from prior visit.  Current Outpatient Medications  Medication Sig Dispense Refill  . acetaminophen (TYLENOL) 500 MG tablet Take 500 mg by mouth every 6 (six) hours as needed (pain).    Marland Kitchen albuterol (PROVENTIL) (2.5 MG/3ML) 0.083% nebulizer solution 90INHALE 1 VIAL VIA NEBULIZER THREE TIMES DAILY. 300 mL 3  . albuterol (VENTOLIN HFA) 108 (90 Base) MCG/ACT inhaler INHALE 2 PUFFS INTO THE LUNGS EVERY 6 HOURS AS NEEDED FOR WHEEZING OR SHORTNESS OF BREATH 54 g 2  . allopurinol (ZYLOPRIM) 100 MG tablet TAKE 1 TABLET(100 MG) BY MOUTH DAILY 90 tablet 1  . BREO ELLIPTA 100-25 MCG/INH AEPB 1 TAKE 1 INHALATION BY MOUTH ONCE DAILY 1 each 4  . cholecalciferol (VITAMIN D3) 25 MCG (1000 UT) tablet Take 1,000 Units by mouth daily.    Marland Kitchen docusate sodium (COLACE) 100 MG capsule Take 300 mg by mouth at bedtime.     . fluticasone (FLONASE) 50 MCG/ACT nasal spray SHAKE LIQUID AND USE 2 SPRAYS IN EACH NOSTRIL DAILY 48 g 1  . folic acid (FOLVITE) 1 MG tablet TAKE 1 TABLET(1 MG) BY MOUTH DAILY 100 tablet 5  . furosemide (LASIX) 40 MG tablet Take 1 tablet (40 mg total) by mouth daily. 90 tablet 1  . gabapentin (NEURONTIN) 300 MG capsule Take 1 capsule (300 mg  total) by mouth 2 (two) times daily. 60 capsule 2  . hydrALAZINE (APRESOLINE) 25 MG tablet Take 1 tablet (25 mg total) by mouth 2 (two) times daily. 180 tablet 3  . HYDROcodone-acetaminophen (NORCO) 7.5-325 MG tablet Take 1 tablet by mouth every 6 (six) hours as needed for moderate pain (Must last 30 days.). 60 tablet 0  . isosorbide mononitrate (IMDUR) 30 MG 24 hr tablet Take 1 tablet (30 mg total) by mouth daily. 30 tablet 6  . Liniments (SALONPAS PAIN RELIEF PATCH EX) Apply 1 patch topically daily as needed (pain).    . meclizine (ANTIVERT) 12.5 MG tablet Take 1 tablet (12.5 mg total) by mouth 3 (three) times daily as needed for dizziness. 30 tablet 2  . metolazone (ZAROXOLYN) 2.5 MG tablet Take one tablet on Saturdays. 12 tablet 5  . metoprolol succinate (TOPROL-XL) 100 MG 24 hr tablet TAKE 1 TABLET BY MOUTH EVERY DAY WITH FOOD OR MILK 90 tablet 1  . Multiple Vitamin (MULTIVITAMIN) LIQD Take 5 mLs by mouth daily.    . nitroGLYCERIN (NITROSTAT) 0.4 MG SL tablet Place 1 tablet (0.4 mg total) under the tongue every 5 (five) minutes as needed for chest pain. 30 tablet 0  . ondansetron (ZOFRAN) 4 MG tablet Take 1 tablet (4 mg total) by mouth daily as needed for nausea or vomiting.  90 tablet 1  . Polyethyl [name] (SYSTANE) 0.4-0.3 % GEL ophthalmic gel Place 1 application into the left eye at bedtime.    . potassium chloride SA (KLOR-CON) 20 MEQ tablet Take 1 tablet (20 mEq total) by mouth daily. 90 tablet 1  . pravastatin (PRAVACHOL) 40 MG tablet TAKE 1 TABLET BY MOUTH EVERY EVENING 90 tablet 3  . SYNTHROID 75 MCG tablet TAKE 1 TABLET BY MOUTH DAILY 90 tablet 1  . warfarin (JANTOVEN) 5 MG tablet TAKE 2 TABLETS EVERY DAY EXCEPT 1 TABLET ON SATURDAY 60 tablet 0   No current facility-administered medications for this visit.   Facility-Administered Medications Ordered in Other Visits  Medication Dose Route Frequency Provider Last Rate Last Admin  . epoetin alfa (EPOGEN,PROCRIT) injection  40,000 Units  40,000 Units Subcutaneous Once Farrel Gobble, MD        REVIEW OF SYSTEMS (negative unless checked):   Cardiac:  []  Chest pain or chest pressure? []  Shortness of breath upon activity? []  Shortness of breath when lying flat? []  Irregular heart rhythm?  Vascular:  []  Pain in calf, thigh, or hip brought on by walking? []  Pain in feet at night that wakes you up from your sleep? []  Blood clot in your veins? []  Leg swelling?  Pulmonary:  []  Oxygen at home? []  Productive cough? []  Wheezing?  Neurologic:  []  Sudden weakness in arms or legs? []  Sudden numbness in arms or legs? []  Sudden onset of difficult speaking or slurred speech? []  Temporary loss of vision in one eye? []  Problems with dizziness?  Gastrointestinal:  []  Blood in stool? []  Vomited blood?  Genitourinary:  []  Burning when urinating? []  Blood in urine?  Psychiatric:  []  Major depression  Hematologic:  []  Bleeding problems? []  Problems with blood clotting?  Dermatologic:  []  Rashes or ulcers?  Constitutional:  []  Fever or chills?  Ear/Nose/Throat:  []  Change in hearing? []  Nose bleeds? []  Sore throat?  Musculoskeletal:  [x]  Back pain? [x]  Joint pain? []  Muscle pain?   Physical Examination   Vitals:   01/14/20 0946  BP: (!) 169/77  Pulse: 60  Resp: 18  Temp: (!) 97.2 F (36.2 C)  SpO2: 94%   There is no height or weight on file to calculate BMI.  General:  WDWN in NAD; vital signs documented above Gait: Not observed HENT: WNL, normocephalic Pulmonary: normal non-labored breathing , without Rales, rhonchi,  wheezing Cardiac: irregular HR Abdomen: soft, NT, no masses Skin: without rashes Vascular Exam/Pulses:  Right Left  Radial 2+ (normal) 2+ (normal)  Ulnar absent absent  Femoral 2+ (normal) 2+ (normal)   Musculoskeletal: no muscle wasting or atrophy  Neurologic: A&O X 3;  No focal weakness or paresthesias are detected Psychiatric:  The pt has Normal  affect.   Non-Invasive Vascular Imaging   AAA Duplex   Current size: 2.7 cm  Previous size: 2.7 cm    Medical Decision Making   [name] [name] is a 86 y.o. (28-Mar-1932) female who presents for surveillance of AAA   AAA duplex again demonstrates maximal diameter of only 2.7 cm  When reviewing 2018 scan demonstrating 4.2 cm AAA this looks to be likely shadowing versus a true aneurysm  Because of this we will recheck AAA duplex in 2 years  Patient states right great toe wound now well-healed  Patient will call/return to office sooner with any nonhealing wounds of bilateral lower extremities   Dagoberto Ligas PA-C [facility] Office: [facility]  MD: Oneida Alar

## 2020-01-15 ENCOUNTER — Ambulatory Visit (INDEPENDENT_AMBULATORY_CARE_PROVIDER_SITE_OTHER): Payer: Medicare Other | Admitting: Family Medicine

## 2020-01-15 ENCOUNTER — Other Ambulatory Visit: Payer: Self-pay | Admitting: *Deleted

## 2020-01-15 ENCOUNTER — Encounter: Payer: Self-pay | Admitting: Family Medicine

## 2020-01-15 VITALS — BP 136/70 | HR 62 | Temp 97.5°F | Resp 15 | Ht 67.0 in | Wt 194.0 lb

## 2020-01-15 DIAGNOSIS — N184 Chronic kidney disease, stage 4 (severe): Secondary | ICD-10-CM

## 2020-01-15 DIAGNOSIS — Z7901 Long term (current) use of anticoagulants: Secondary | ICD-10-CM

## 2020-01-15 DIAGNOSIS — Z952 Presence of prosthetic heart valve: Secondary | ICD-10-CM

## 2020-01-15 DIAGNOSIS — D6832 Hemorrhagic disorder due to extrinsic circulating anticoagulants: Secondary | ICD-10-CM

## 2020-01-15 DIAGNOSIS — I10 Essential (primary) hypertension: Secondary | ICD-10-CM

## 2020-01-15 DIAGNOSIS — T45515A Adverse effect of anticoagulants, initial encounter: Secondary | ICD-10-CM

## 2020-01-15 DIAGNOSIS — J42 Unspecified chronic bronchitis: Secondary | ICD-10-CM

## 2020-01-15 DIAGNOSIS — I714 Abdominal aortic aneurysm, without rupture, unspecified: Secondary | ICD-10-CM

## 2020-01-15 DIAGNOSIS — Z95810 Presence of automatic (implantable) cardiac defibrillator: Secondary | ICD-10-CM

## 2020-01-15 DIAGNOSIS — M25512 Pain in left shoulder: Secondary | ICD-10-CM

## 2020-01-15 DIAGNOSIS — Z79899 Other long term (current) drug therapy: Secondary | ICD-10-CM

## 2020-01-15 MED ORDER — KETOROLAC TROMETHAMINE 60 MG/2ML IM SOLN
30.0000 mg | Freq: Once | INTRAMUSCULAR | Status: AC
  Administered 2020-01-15: 30 mg via INTRAMUSCULAR

## 2020-01-15 MED ORDER — METHYLPREDNISOLONE ACETATE 80 MG/ML IJ SUSP
40.0000 mg | Freq: Once | INTRAMUSCULAR | Status: AC
  Administered 2020-01-15: 15:00:00 40 mg via INTRAMUSCULAR

## 2020-01-15 NOTE — Assessment & Plan Note (Signed)
Blood pressure demonstrates intermittent control at times.  Reports that she has not taken her meds on the schedule time.  Last time that she spoke with Dr. [name] in February there was discussion as to how to take this and when to take this.  She is now taking her medications more consistently on a regular basis.  Blood pressure is demonstrated control today.  Encouraged her fluid restriction as well as her limited salt intake.  Has difficulty with mobility and exercise.

## 2020-01-15 NOTE — Addendum Note (Signed)
Addended by: Eual Fines on: 01/15/2020 03:02 PM   Modules accepted: Orders

## 2020-01-15 NOTE — Assessment & Plan Note (Signed)
Mechanical valve of aortic valve.  Reports doing well on warfarin therapy not having any difficulty or trouble with this.  Is followed closely by cardiology.

## 2020-01-15 NOTE — Assessment & Plan Note (Signed)
Is followed extensively by cardiology.  Reports doing well with this and not having any trouble or difficulty at this time.  Has follow-up soon with cardiology.

## 2020-01-15 NOTE — Patient Instructions (Signed)
I appreciate the opportunity to provide you with care for your health and wellness. Today we discussed: blood pressure and left arm pain   Follow up: 3 months   No labs or referrals today  Injections for pain today. Nebulizer form completed.  Please continue to practice social distancing to keep you, your family, and our community safe.  If you must go out, please wear a mask and practice good handwashing.  It was a pleasure to see you and I look forward to continuing to work together on your health and well-being. Please do not hesitate to call the office if you need care or have questions about your care.  Have a wonderful day and week. With Gratitude, Cherly Beach, DNP, AGNP-BC

## 2020-01-15 NOTE — Assessment & Plan Note (Signed)
History of MVR, mechanical valve, noted on exam today with click. On Coumadin therapy.  Reports doing well with this no signs and symptoms of bleeding.

## 2020-01-15 NOTE — Assessment & Plan Note (Signed)
Is on several medications that are at high risk for the elderly.  Medication review would be of benefit in the near future.  Continue all medications at this time.  No signs and symptoms of falls or bleeding.

## 2020-01-15 NOTE — Progress Notes (Signed)
Subjective:  Patient ID: [name] [name], female    DOB: 1932/03/02  Age: 86 y.o. MRN: [id number]  CC:  Chief Complaint  Patient presents with  . Hypertension    8 week visit to evaluate blood pressure       HPI  HPI [name] [name] is an 86 year old female patient who presents today for follow-up on high blood pressure from 8 weeks back when she was seen in the office blood pressure was elevated.  Denies having any headaches, chest pain, leg swelling, shortness of breath or anything out of her ordinary baseline levels.  Recently just followed up with vascular surgery yesterday follow-up on AAA scanning: 2.7 cm was largest aortic diameter noted unchanged from previous exam in June 2020.  Recent labs demonstrated kidney function remains in the 30s, elevated BUN as well.  Has difficult time with this secondary to congestive heart failure.  Reports taking all her medications without any issues or concerns.  Is on home oxygen 2 L.  Reports needing a new nebulizer as her current one is not working.  Additionally she reports left arm and shoulder pain.  She does sleep on that side, uses her cane when she walks on that side.  Denies having any falls or injury to that site.  Reports that she has never been diagnosed with any issues in that shoulder arm before.  She is unsure of why it is causing so much trouble in the last month.  Is open to having pain injections today if possible.  And PT if no improvement noted.  Today patient denies signs and symptoms of COVID 19 infection including fever, chills, cough, shortness of breath, and headache. Past Medical, Surgical, Social History, Allergies, and Medications have been Reviewed.   Past Medical History:  Diagnosis Date  . Acute on chronic diastolic CHF (congestive heart failure) ([place]) 05/19/2012  . Adenomatous polyp of colon 12/16/2002   Dr. [name]/[facility]  . Allergy   . Calculus of gallbladder with chronic  cholecystitis without obstruction 02/09/2009   Qualifier: Diagnosis of  By: [name], [name]    . Cataract   . CHB (complete heart block) ([place]) 10/07/2014      . CHF (congestive heart failure) ([place])    a.  EF previously reduced to 25-30% b. EF improved to 60-65% by echo in 10/2017.   Marland Kitchen Chronic kidney disease, stage I 2006  . Constipation       . COPD (chronic obstructive pulmonary disease) ([place])       . DJD (degenerative joint disease) of lumbar spine   . DM (diabetes mellitus) ([place]) 2006  . Esophageal reflux   . Glaucoma   . Gout       . H/O adenomatous polyp of colon 05/24/2012   2004 in [place] colonoscopy 2008 normal    . H/O heart valve replacement with mechanical valve    a. s/p mechanical AVR in 2001 and mechanical MVR in 2004.  Marland Kitchen H/O wisdom tooth extraction   . HIP PAIN, RIGHT 04/23/2009   Qualifier: Diagnosis of  By: [name], [name]    . Hyperlipemia   . IDA (iron deficiency anemia)    Parenteral iron/[name]  . Insomnia       . Nausea without vomiting 06/07/2010   Qualifier: Diagnosis of  By: [name], [name]    . Non-ischemic cardiomyopathy ([place])    a. s/p MDT CRTD  . Obesity, unspecified   .  Other and unspecified hyperlipidemia   . Oxygen deficiency 2014   nocturnal;  . Papilloma of breast 03/06/2012   This was diagnosed as a left breast papilloma by ultrasound characteristics and symptoms in 2010. Because of possible medical risk factors no surgical intervention has been done and it did doing annual followups. The area has been stable by physical examination and mammograms and ultrasound since 2010.   Marland Kitchen Spinal stenosis of lumbar region 02/19/2013  . Spondylolisthesis   . Spondylosis   . Thyroid cancer ([place])    remote thyroidectomy, no recurrence, pt denies in 2016 thyroid cancer  . Type 2 diabetes mellitus with hyperglycemia ([place]) 07/15/2010  . Unspecified hypothyroidism       . Varicose veins of lower extremities with other complications    . Vertigo         Current Meds  Medication Sig  . acetaminophen (TYLENOL) 500 MG tablet Take 500 mg by mouth every 6 (six) hours as needed (pain).  Marland Kitchen albuterol (PROVENTIL) (2.5 MG/3ML) 0.083% nebulizer solution 90INHALE 1 VIAL VIA NEBULIZER THREE TIMES DAILY.  Marland Kitchen albuterol (VENTOLIN HFA) 108 (90 Base) MCG/ACT inhaler INHALE 2 PUFFS INTO THE LUNGS EVERY 6 HOURS AS NEEDED FOR WHEEZING OR SHORTNESS OF BREATH  . allopurinol (ZYLOPRIM) 100 MG tablet TAKE 1 TABLET(100 MG) BY MOUTH DAILY  . BREO ELLIPTA 100-25 MCG/INH AEPB 1 TAKE 1 INHALATION BY MOUTH ONCE DAILY  . cholecalciferol (VITAMIN D3) 25 MCG (1000 UT) tablet Take 1,000 Units by mouth daily.  Marland Kitchen docusate sodium (COLACE) 100 MG capsule Take 300 mg by mouth at bedtime.   . fluticasone (FLONASE) 50 MCG/ACT nasal spray SHAKE LIQUID AND USE 2 SPRAYS IN EACH NOSTRIL DAILY  . folic acid (FOLVITE) 1 MG tablet TAKE 1 TABLET(1 MG) BY MOUTH DAILY  . furosemide (LASIX) 40 MG tablet Take 1 tablet (40 mg total) by mouth daily.  Marland Kitchen gabapentin (NEURONTIN) 300 MG capsule Take 1 capsule (300 mg total) by mouth 2 (two) times daily.  . hydrALAZINE (APRESOLINE) 25 MG tablet Take 1 tablet (25 mg total) by mouth 2 (two) times daily.  Marland Kitchen HYDROcodone-acetaminophen (NORCO) 7.5-325 MG tablet Take 1 tablet by mouth every 6 (six) hours as needed for moderate pain (Must last 30 days.).  Marland Kitchen isosorbide mononitrate (IMDUR) 30 MG 24 hr tablet Take 1 tablet (30 mg total) by mouth daily.  . Liniments (SALONPAS PAIN RELIEF PATCH EX) Apply 1 patch topically daily as needed (pain).  . meclizine (ANTIVERT) 12.5 MG tablet Take 1 tablet (12.5 mg total) by mouth 3 (three) times daily as needed for dizziness.  . metolazone (ZAROXOLYN) 2.5 MG tablet Take one tablet on Saturdays.  . metoprolol succinate (TOPROL-XL) 100 MG 24 hr tablet TAKE 1 TABLET BY MOUTH EVERY DAY WITH FOOD OR MILK  . Multiple Vitamin (MULTIVITAMIN) LIQD Take 5 mLs by mouth daily.  . nitroGLYCERIN (NITROSTAT) 0.4 MG  SL tablet Place 1 tablet (0.4 mg total) under the tongue every 5 (five) minutes as needed for chest pain.  Marland Kitchen ondansetron (ZOFRAN) 4 MG tablet Take 1 tablet (4 mg total) by mouth daily as needed for nausea or vomiting.  [name] [name] (SYSTANE) 0.4-0.3 % GEL ophthalmic gel Place 1 application into the left eye at bedtime.  . potassium chloride SA (KLOR-CON) 20 MEQ tablet Take 1 tablet (20 mEq total) by mouth daily.  . pravastatin (PRAVACHOL) 40 MG tablet TAKE 1 TABLET BY MOUTH EVERY EVENING  . SYNTHROID 75 MCG tablet TAKE 1 TABLET BY  MOUTH DAILY  . warfarin (JANTOVEN) 5 MG tablet TAKE 2 TABLETS EVERY DAY EXCEPT 1 TABLET ON SATURDAY    ROS:  Review of Systems  Constitutional: Negative.   HENT: Negative.   Eyes: Negative.   Respiratory: Negative.   Cardiovascular: Negative.   Gastrointestinal: Negative.   Genitourinary: Negative.   Musculoskeletal: Negative.   Skin: Negative.   Neurological: Negative.   Endo/Heme/Allergies: Negative.   Psychiatric/Behavioral: Negative.   All other systems reviewed and are negative.    Objective:   Today's Vitals: BP 136/70   Pulse 62   Temp (!) 97.5 F (36.4 C) (Temporal)   Resp 15   Ht 5\' 7"  (1.702 m)   Wt 194 lb (88 kg)   SpO2 (!) 88% Comment: 2 liters oxygen  BMI 30.38 kg/m  Vitals with BMI 01/15/2020 01/14/2020 01/02/2020  Height 5\' 7"  - -  Weight 194 lbs - -  BMI 42.68 - -  Systolic 341 962 229  Diastolic 70 77 62  Pulse 62 60 59     Physical Exam Vitals and nursing note reviewed.  Constitutional:      Appearance: Normal appearance. She is obese.  HENT:     Head: Normocephalic and atraumatic.     Right Ear: External ear normal.     Left Ear: External ear normal.     Mouth/Throat:     Comments: Mask in place Eyes:     General:        Right eye: No discharge.        Left eye: No discharge.     Conjunctiva/sclera: Conjunctivae normal.     Comments: Glasses  Cardiovascular:     Rate and Rhythm: Normal rate.  Rhythm irregular.     Pulses: Normal pulses.     Heart sounds: Normal heart sounds.     Comments: Noted mechanical valve click  Pulmonary:     Effort: Pulmonary effort is normal.     Breath sounds: Normal breath sounds.  Musculoskeletal:     Cervical back: Normal range of motion and neck supple.     Comments: Limited range of motion of left shoulder and arm. In wheelchair today  Skin:    General: Skin is warm.  Neurological:     General: No focal deficit present.     Mental Status: She is alert and oriented to person, place, and time.  Psychiatric:        Mood and Affect: Mood normal.        Behavior: Behavior normal.        Thought Content: Thought content normal.        Judgment: Judgment normal.    Assessment   1. CKD (chronic kidney disease), stage IV ([place])   2. Chronic bronchitis, unspecified chronic bronchitis type ([place])   3. Warfarin-induced coagulopathy ([place])   4. Acute pain of left shoulder   5. Essential hypertension   6. High risk medication use   7. Hx of aortic valve replacement, mechanical   8. H/O mitral valve replacement with mechanical valve   9. Biventricular automatic implantable cardioverter defibrillator in situ   10. Chronic anticoagulation, on coumadin for Mechanical AVR and MVR     Tests ordered No orders of the defined types were placed in this encounter.    Plan: Please see assessment and plan per problem list above.   No orders of the defined types were placed in this encounter.   Patient to follow-up in 02/05/2020 .  Barbee Cough  Jerelene Redden, NP

## 2020-01-15 NOTE — Assessment & Plan Note (Signed)
Injections today to help with pain. PT if not improved.

## 2020-01-15 NOTE — Assessment & Plan Note (Signed)
Is on Coumadin therapy secondary to having mechanical valves.  Denies having any signs or symptoms of bleeding.  Overall doing well with Coumadin therapy.  Follows up quite closely with INRs and is followed by cardiology regularly.

## 2020-01-15 NOTE — Assessment & Plan Note (Signed)
Is followed by nephrology, there is a balance between managing her fluid restriction for heart failure and her kidney function as she does have an elevated BUN on this last CMP to demonstrate her being dry.  Denies having any shortness of breath at this time.  Encouraged her to make sure she does take her in her full allotment of fluid restriction.

## 2020-01-15 NOTE — Assessment & Plan Note (Signed)
Has had recent prednisone back in February.  Reports that she is doing okay at this time.  Not having any increase shortness of breath.  Does report that she needs to have a new nebulizer machine sent out.  Will look into getting this ordered.

## 2020-01-15 NOTE — Assessment & Plan Note (Signed)
Warfarin therapy secondary to valve replacement.  Denies having any signs and symptoms of bleeding.  Denies having any melena stool.  Reports taking her medications as directed and following up with her INR is as directed.

## 2020-01-16 ENCOUNTER — Telehealth: Payer: Self-pay

## 2020-01-16 NOTE — Telephone Encounter (Signed)
FYI

## 2020-01-16 NOTE — Telephone Encounter (Signed)
Wonderful, so happy it helped.

## 2020-01-16 NOTE — Telephone Encounter (Signed)
The shot that you gave her worked perfect. I told the pt I would let [name] [name] know

## 2020-01-21 ENCOUNTER — Other Ambulatory Visit: Payer: Self-pay

## 2020-01-21 ENCOUNTER — Inpatient Hospital Stay (HOSPITAL_COMMUNITY): Payer: Medicare Other

## 2020-01-21 ENCOUNTER — Inpatient Hospital Stay (HOSPITAL_COMMUNITY): Payer: Medicare Other | Attending: Hematology

## 2020-01-21 ENCOUNTER — Inpatient Hospital Stay (HOSPITAL_BASED_OUTPATIENT_CLINIC_OR_DEPARTMENT_OTHER): Payer: Medicare Other | Admitting: Nurse Practitioner

## 2020-01-21 ENCOUNTER — Encounter (HOSPITAL_COMMUNITY): Payer: Self-pay | Admitting: Nurse Practitioner

## 2020-01-21 DIAGNOSIS — E611 Iron deficiency: Secondary | ICD-10-CM

## 2020-01-21 DIAGNOSIS — N189 Chronic kidney disease, unspecified: Secondary | ICD-10-CM | POA: Diagnosis not present

## 2020-01-21 DIAGNOSIS — D631 Anemia in chronic kidney disease: Secondary | ICD-10-CM | POA: Insufficient documentation

## 2020-01-21 DIAGNOSIS — D649 Anemia, unspecified: Secondary | ICD-10-CM

## 2020-01-21 DIAGNOSIS — N184 Chronic kidney disease, stage 4 (severe): Secondary | ICD-10-CM

## 2020-01-21 LAB — CBC WITH DIFFERENTIAL/PLATELET
Abs Immature Granulocytes: 0.02 10*3/uL (ref 0.00–0.07)
Basophils Absolute: 0 10*3/uL (ref 0.0–0.1)
Basophils Relative: 0 %
Eosinophils Absolute: 0 10*3/uL (ref 0.0–0.5)
Eosinophils Relative: 0 %
HCT: 30.2 % — ABNORMAL LOW (ref 36.0–46.0)
Hemoglobin: 9.2 g/dL — ABNORMAL LOW (ref 12.0–15.0)
Immature Granulocytes: 0 %
Lymphocytes Relative: 16 %
Lymphs Abs: 0.7 10*3/uL (ref 0.7–4.0)
MCH: 29.6 pg (ref 26.0–34.0)
MCHC: 30.5 g/dL (ref 30.0–36.0)
MCV: 97.1 fL (ref 80.0–100.0)
Monocytes Absolute: 0.4 10*3/uL (ref 0.1–1.0)
Monocytes Relative: 8 %
Neutro Abs: 3.6 10*3/uL (ref 1.7–7.7)
Neutrophils Relative %: 76 %
Platelets: 162 10*3/uL (ref 150–400)
RBC: 3.11 MIL/uL — ABNORMAL LOW (ref 3.87–5.11)
RDW: 16.3 % — ABNORMAL HIGH (ref 11.5–15.5)
WBC: 4.7 10*3/uL (ref 4.0–10.5)
nRBC: 0 % (ref 0.0–0.2)

## 2020-01-21 LAB — IRON AND TIBC
Iron: 51 ug/dL (ref 28–170)
Saturation Ratios: 18 % (ref 10.4–31.8)
TIBC: 289 ug/dL (ref 250–450)
UIBC: 238 ug/dL

## 2020-01-21 LAB — COMPREHENSIVE METABOLIC PANEL
ALT: 20 U/L (ref 0–44)
AST: 31 U/L (ref 15–41)
Albumin: 3.7 g/dL (ref 3.5–5.0)
Alkaline Phosphatase: 50 U/L (ref 38–126)
Anion gap: 11 (ref 5–15)
BUN: 43 mg/dL — ABNORMAL HIGH (ref 8–23)
CO2: 29 mmol/L (ref 22–32)
Calcium: 9.5 mg/dL (ref 8.9–10.3)
Chloride: 98 mmol/L (ref 98–111)
Creatinine, Ser: 1.34 mg/dL — ABNORMAL HIGH (ref 0.44–1.00)
GFR calc Af Amer: 42 mL/min — ABNORMAL LOW (ref >60)
GFR calc non Af Amer: 36 mL/min — ABNORMAL LOW (ref >60)
Glucose, Bld: 88 mg/dL (ref 70–99)
Potassium: 5 mmol/L (ref 3.5–5.1)
Sodium: 138 mmol/L (ref 135–145)
Total Bilirubin: 0.4 mg/dL (ref 0.3–1.2)
Total Protein: 7.8 g/dL (ref 6.5–8.1)

## 2020-01-21 LAB — LACTATE DEHYDROGENASE: LDH: 197 U/L — ABNORMAL HIGH (ref 98–192)

## 2020-01-21 LAB — VITAMIN B12: Vitamin B-12: 435 pg/mL (ref 180–914)

## 2020-01-21 LAB — FERRITIN: Ferritin: 429 ng/mL — ABNORMAL HIGH (ref 11–307)

## 2020-01-21 LAB — VITAMIN D 25 HYDROXY (VIT D DEFICIENCY, FRACTURES): Vit D, 25-Hydroxy: 38.98 ng/mL (ref 30–100)

## 2020-01-21 MED ORDER — EPOETIN ALFA-EPBX 40000 UNIT/ML IJ SOLN
40000.0000 [IU] | Freq: Once | INTRAMUSCULAR | Status: AC
  Administered 2020-01-21: 12:00:00 40000 [IU] via SUBCUTANEOUS
  Filled 2020-01-21: qty 1

## 2020-01-21 NOTE — Progress Notes (Signed)
[name] [name] presents today for Retacrit injection. Hemoglobin reviewed prior to administration. VSS. Injection tolerated without incident or complaint. See MAR for details. Patient discharged in satisfactory condition with follow up instructions.

## 2020-01-21 NOTE — Assessment & Plan Note (Addendum)
1.  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 -She has been on Procrit that initially started 3 years ago, with overall stability in her hemoglobin. -She is now on Procrit injections 40,000 units every 4 weeks. -She is taking iron tablets twice daily and tolerating them well. -Labs on 01/21/2020 showed hemoglobin 9.2, platelets 162, ferritin and iron panel pending -She will continue the Procrit injections every 4 weeks at this time. -She will follow-up in 3 months with repeat labs  2.  Chronic kidney disease: -Labs on 01/21/2020 showed creatinine 1.34 -Continue to follow-up with nephrology.

## 2020-01-21 NOTE — Patient Instructions (Signed)
[facility] at [facility] Discharge Instructions  Follow up in 12 weeks with labs    Thank you for choosing [facility] at [facility] to provide your oncology and hematology care.  To afford each patient quality time with our provider, please arrive at least 15 minutes before your scheduled appointment time.   If you have a lab appointment with the Cancer Center please come in thru the Main Entrance and check in at the main information desk.  You need to re-schedule your appointment should you arrive 10 or more minutes late.  We strive to give you quality time with our providers, and arriving late affects you and other patients whose appointments are after yours.  Also, if you no show three or more times for appointments you may be dismissed from the clinic at the providers discretion.     Again, thank you for choosing [facility].  Our hope is that these requests will decrease the amount of time that you wait before being seen by our physicians.       _____________________________________________________________  Should you have questions after your visit to [facility], please contact our office at [phone] between the hours of 8:00 a.m. and 4:30 p.m.  Voicemails left after 4:00 p.m. will not be returned until the following business day.  For prescription refill requests, have your pharmacy contact our office and allow 72 hours.    Due to Covid, you will need to wear a mask upon entering the hospital. If you do not have a mask, a mask will be given to you at the Main Entrance upon arrival. For doctor visits, patients may have 1 support person with them. For treatment visits, patients can not have anyone with them due to social distancing guidelines and our immunocompromised population.      

## 2020-01-21 NOTE — Patient Instructions (Signed)
[facility] at [facility]  Discharge Instructions:  Retacrit injection received today. Follow up as scheduled. _______________________________________________________________  Thank you for choosing [facility] at [facility] to provide your oncology and hematology care.  To afford each patient quality time with our providers, please arrive at least 15 minutes before your scheduled appointment.  You need to re-schedule your appointment if you arrive 10 or more minutes late.  We strive to give you quality time with our providers, and arriving late affects you and other patients whose appointments are after yours.  Also, if you no show three or more times for appointments you may be dismissed from the clinic.  Again, thank you for choosing [facility] at [facility]. Our hope is that these requests will allow you access to exceptional care and in a timely manner. _______________________________________________________________  If you have questions after your visit, please contact our office at [phone] between the hours of 8:30 a.m. and 5:00 p.m. Voicemails left after 4:30 p.m. will not be returned until the following business day. _______________________________________________________________  For prescription refill requests, have your pharmacy contact our office. _______________________________________________________________  Recommendations made by the consultant and any test results will be sent to your referring physician. _______________________________________________________________ 

## 2020-01-21 NOTE — Progress Notes (Signed)
[place] [place], [place] [address]    CLINIC:  Medical Oncology/Hematology  PCP:  Fayrene Helper, MD [address] [place] [address] [phone]   REASON FOR VISIT: Follow-up for normocytic anemia  CURRENT THERAPY: Intermittent iron infusions   INTERVAL HISTORY:  [name] 86 y.o. female returns for routine follow-up for normocytic anemia.  Patient reports she has been doing well since her last visit.  She denies any bright red bleeding per rectum or melena.  She denies any easy bruising or bleeding. Denies any nausea, vomiting, or diarrhea. Denies any new pains. Had not noticed any recent bleeding such as epistaxis, hematuria or hematochezia. Denies recent chest pain on exertion, shortness of breath on minimal exertion, pre-syncopal episodes, or palpitations. Denies any numbness or tingling in hands or feet. Denies any recent fevers, infections, or recent hospitalizations. Patient reports appetite at 75% and energy level at 25%.  He is eating well maintain his weight at this time.    REVIEW OF SYSTEMS:  Review of Systems  Constitutional: Positive for fatigue.  Respiratory: Positive for cough.   Neurological: Positive for dizziness and numbness.  Psychiatric/Behavioral: Positive for sleep disturbance.  All other systems reviewed and are negative.    PAST MEDICAL/SURGICAL HISTORY:  Past Medical History:  Diagnosis Date  . Acute on chronic diastolic CHF (congestive heart failure) ([place]) 05/19/2012  . Adenomatous polyp of colon 12/16/2002   Dr. [name]/[facility]  . Allergy   . Calculus of gallbladder with chronic cholecystitis without obstruction 02/09/2009   Qualifier: Diagnosis of  By: [name], [name]    . Cataract   . CHB (complete heart block) ([place]) 10/07/2014      . CHF (congestive heart failure) ([place])    a.  EF previously reduced to 25-30% b. EF improved to 60-65% by echo in  10/2017.   Marland Kitchen Chronic kidney disease, stage I 2006  . Constipation       . COPD (chronic obstructive pulmonary disease) ([place])       . DJD (degenerative joint disease) of lumbar spine   . DM (diabetes mellitus) ([place]) 2006  . Esophageal reflux   . Glaucoma   . Gout       . H/O adenomatous polyp of colon 05/24/2012   2004 in [place] colonoscopy 2008 normal    . H/O heart valve replacement with mechanical valve    a. s/p mechanical AVR in 2001 and mechanical MVR in 2004.  Marland Kitchen H/O wisdom tooth extraction   . HIP PAIN, RIGHT 04/23/2009   Qualifier: Diagnosis of  By: [name], [name]    . Hyperlipemia   . IDA (iron deficiency anemia)    Parenteral iron/[name]  . Insomnia       . Nausea without vomiting 06/07/2010   Qualifier: Diagnosis of  By: [name], [name]    . Non-ischemic cardiomyopathy ([place])    a. s/p MDT CRTD  . Obesity, unspecified   . Other and unspecified hyperlipidemia   . Oxygen deficiency 2014   nocturnal;  . Papilloma of breast 03/06/2012   This was diagnosed as a left breast papilloma by ultrasound characteristics and symptoms in 2010. Because of possible medical risk factors no surgical intervention has been done and it did doing annual followups. The area has been stable by physical examination and mammograms and ultrasound since 2010.   Marland Kitchen Spinal stenosis of lumbar region 02/19/2013  .  Spondylolisthesis   . Spondylosis   . Thyroid cancer ([place])    remote thyroidectomy, no recurrence, pt denies in 2016 thyroid cancer  . Type 2 diabetes mellitus with hyperglycemia ([place]) 07/15/2010  . Unspecified hypothyroidism       . 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 Vertigo        Past Surgical History:  Procedure Laterality Date  . AORTIC AND MITRAL VALVE REPLACEMENT  2001  . BI-VENTRICULAR IMPLANTABLE CARDIOVERTER DEFIBRILLATOR UPGRADE N/A 01/18/2015   Procedure: BI-VENTRICULAR IMPLANTABLE CARDIOVERTER DEFIBRILLATOR UPGRADE;   Surgeon: Evans Lance, MD;  Location: [facility] CATH LAB;  Service: Cardiovascular;  Laterality: N/A;  . BIV ICD GENERATOR CHANGEOUT N/A 01/30/2019   Procedure: BIV ICD GENERATOR CHANGEOUT;  Surgeon: Evans Lance, MD;  Location: [facility] CV LAB;  Service: Cardiovascular;  Laterality: N/A;  . CARDIAC CATHETERIZATION    . CARDIAC VALVE REPLACEMENT    . CARDIOVERSION N/A 11/13/2016   Procedure: CARDIOVERSION;  Surgeon: Sanda Klein, MD;  Location: [facility] ENDOSCOPY;  Service: Cardiovascular;  Laterality: N/A;  . COLONOSCOPY  06/12/2007   Rourk- Normal rectum/Normal colon  . COLONOSCOPY  12/16/02   small hemorrhoids  . COLONOSCOPY  06/20/2012   Procedure: COLONOSCOPY;  Surgeon: Daneil Dolin, MD;  Location: [facility] ENDO SUITE;  Service: Endoscopy;  Laterality: N/A;  10:45  . defibrillator implanted 2006    . DOPPLER ECHOCARDIOGRAPHY  2012  . DOPPLER ECHOCARDIOGRAPHY  05,06,07,08,09,2011  . ESOPHAGOGASTRODUODENOSCOPY  06/12/2007   Rourk- normal esophagus, small hiatal hernia, otherwise normal stomach, D1, D2  . EYE SURGERY Right 2005  . EYE SURGERY Left 2006  . ICD LEAD REMOVAL Left 02/08/2015   Procedure: ICD LEAD REMOVAL;  Surgeon: Evans Lance, MD;  Location: [facility] OR;  Service: Cardiovascular;  Laterality: Left;  "Will plan extraction and insertion of a BiV PM"  **Dr. Roxan Hockey backing up case**  . IMPLANTABLE CARDIOVERTER DEFIBRILLATOR (ICD) GENERATOR CHANGE Left 02/08/2015   Procedure: ICD GENERATOR CHANGE;  Surgeon: Evans Lance, MD;  Location: [place];  Service: Cardiovascular;  Laterality: Left;  . INSERT / REPLACE / REMOVE PACEMAKER    . PACEMAKER INSERTION  May 2016  . pacemaker placed  2004  . right breast cyst removed     benign   . right cataract removed     2005  . TEE WITHOUT CARDIOVERSION N/A 11/13/2016   Procedure: TRANSESOPHAGEAL ECHOCARDIOGRAM (TEE);  Surgeon: Sanda Klein, MD;  Location: [facility] ENDOSCOPY;  Service: Cardiovascular;  Laterality: N/A;  . THYROIDECTOMY        SOCIAL HISTORY:  Social History   Socioeconomic History  . Marital status: Widowed    Spouse name: Not on file  . Number of children: 0  . Years of education: Not on file  . Highest education level: Not on file  Occupational History  . Occupation: retired    Fish farm manager: RETIRED  Tobacco Use  . Smoking status: Former Smoker    Packs/day: 0.75    Years: 40.00    Pack years: 30.00    Types: Cigarettes    Quit date: 03/08/1987    Years since quitting: 32.8  . Smokeless tobacco: Never Used  Substance and Sexual Activity  . Alcohol use: No    Alcohol/week: 0.0 standard drinks  . Drug use: No  . Sexual activity: Not Currently  Other Topics Concern  . Not on file  Social History Narrative   From [place] (2004)   Social Determinants of Health  Financial Resource Strain: Medium Risk  . Difficulty of Paying Living Expenses: Somewhat hard  Food Insecurity: No Food Insecurity  . Worried About Charity fundraiser in the Last Year: Never true  . Ran Out of Food in the Last Year: Never true  Transportation Needs: No Transportation Needs  . Lack of Transportation (Medical): No  . Lack of Transportation (Non-Medical): No  Physical Activity: Insufficiently Active  . Days of Exercise per Week: 2 days  . Minutes of Exercise per Session: 20 min  Stress: No Stress Concern Present  . Feeling of Stress : Not at all  Social Connections: Somewhat Isolated  . Frequency of Communication with Friends and Family: More than three times a week  . Frequency of Social Gatherings with Friends and Family: Never  . Attends Religious Services: More than 4 times per year  . Active Member of Clubs or Organizations: No  . Attends Archivist Meetings: Never  . Marital Status: Widowed  Intimate Partner Violence: Not At Risk  . Fear of Current or Ex-Partner: No  . Emotionally Abused: No  . Physically Abused: No  . Sexually Abused: No    FAMILY HISTORY:  Family History  Problem  Relation Age of Onset  . Pancreatic cancer Mother   . Heart disease Father   . Heart disease Sister   . Heart attack Sister   . Heart disease Brother   . Diabetes Brother   . Heart disease Brother   . Diabetes Brother   . Hypertension Brother   . Lung cancer Brother     CURRENT MEDICATIONS:  Outpatient Encounter Medications as of 01/21/2020  Medication Sig  . albuterol (PROVENTIL) (2.5 MG/3ML) 0.083% nebulizer solution 90INHALE 1 VIAL VIA NEBULIZER THREE TIMES DAILY.  Marland Kitchen allopurinol (ZYLOPRIM) 100 MG tablet TAKE 1 TABLET(100 MG) BY MOUTH DAILY  . BREO ELLIPTA 100-25 MCG/INH AEPB 1 TAKE 1 INHALATION BY MOUTH ONCE DAILY  . cholecalciferol (VITAMIN D3) 25 MCG (1000 UT) tablet Take 1,000 Units by mouth daily.  Marland Kitchen docusate sodium (COLACE) 100 MG capsule Take 300 mg by mouth at bedtime.   . fluticasone (FLONASE) 50 MCG/ACT nasal spray SHAKE LIQUID AND USE 2 SPRAYS IN EACH NOSTRIL DAILY  . folic acid (FOLVITE) 1 MG tablet TAKE 1 TABLET(1 MG) BY MOUTH DAILY  . furosemide (LASIX) 40 MG tablet Take 1 tablet (40 mg total) by mouth daily.  Marland Kitchen gabapentin (NEURONTIN) 300 MG capsule Take 1 capsule (300 mg total) by mouth 2 (two) times daily.  . hydrALAZINE (APRESOLINE) 25 MG tablet Take 1 tablet (25 mg total) by mouth 2 (two) times daily.  . isosorbide mononitrate (IMDUR) 30 MG 24 hr tablet Take 1 tablet (30 mg total) by mouth daily.  . Liniments (SALONPAS PAIN RELIEF PATCH EX) Apply 1 patch topically daily as needed (pain).  Marland Kitchen metolazone (ZAROXOLYN) 2.5 MG tablet Take one tablet on Saturdays.  . metoprolol succinate (TOPROL-XL) 100 MG 24 hr tablet TAKE 1 TABLET BY MOUTH EVERY DAY WITH FOOD OR MILK  . Multiple Vitamin (MULTIVITAMIN) LIQD Take 5 mLs by mouth daily.  [name] [name] (SYSTANE) 0.4-0.3 % GEL ophthalmic gel Place 1 application into the left eye at bedtime.  . potassium chloride SA (KLOR-CON) 20 MEQ tablet Take 1 tablet (20 mEq total) by mouth daily.  . pravastatin  (PRAVACHOL) 40 MG tablet TAKE 1 TABLET BY MOUTH EVERY EVENING  . SYNTHROID 75 MCG tablet TAKE 1 TABLET BY MOUTH DAILY  . warfarin (JANTOVEN) 5  MG tablet TAKE 2 TABLETS EVERY DAY EXCEPT 1 TABLET ON SATURDAY  . acetaminophen (TYLENOL) 500 MG tablet Take 500 mg by mouth every 6 (six) hours as needed (pain).  Marland Kitchen albuterol (VENTOLIN HFA) 108 (90 Base) MCG/ACT inhaler INHALE 2 PUFFS INTO THE LUNGS EVERY 6 HOURS AS NEEDED FOR WHEEZING OR SHORTNESS OF BREATH (Patient not taking: Reported on 01/21/2020)  . HYDROcodone-acetaminophen (NORCO) 7.5-325 MG tablet Take 1 tablet by mouth every 6 (six) hours as needed for moderate pain (Must last 30 days.). (Patient not taking: Reported on 01/21/2020)  . meclizine (ANTIVERT) 12.5 MG tablet Take 1 tablet (12.5 mg total) by mouth 3 (three) times daily as needed for dizziness. (Patient not taking: Reported on 01/21/2020)  . nitroGLYCERIN (NITROSTAT) 0.4 MG SL tablet Place 1 tablet (0.4 mg total) under the tongue every 5 (five) minutes as needed for chest pain. (Patient not taking: Reported on 01/21/2020)  . ondansetron (ZOFRAN) 4 MG tablet Take 1 tablet (4 mg total) by mouth daily as needed for nausea or vomiting. (Patient not taking: Reported on 01/21/2020)   Facility-Administered Encounter Medications as of 01/21/2020  Medication  . epoetin alfa (EPOGEN,PROCRIT) injection 40,000 Units    ALLERGIES:  Allergies  Allergen Reactions  . Penicillins Other (See Comments)    Bruise Did it involve swelling of the face/tongue/throat, SOB, or low BP? No Did it involve sudden or severe rash/hives, skin peeling, or any reaction on the inside of your mouth or nose? No Did you need to seek medical attention at a hospital or doctor's office? No When did it last happen?20+ years If all above answers are "NO", may proceed with cephalosporin use.   . Aspirin Other (See Comments)    On coumadin  . Fentanyl Dermatitis    Rash with patch   . Niacin Itching     PHYSICAL  EXAM:  ECOG Performance status: 1  Vitals:   01/21/20 1125  BP: (!) 144/58  Pulse: 60  Resp: 18  Temp: (!) 96.8 F (36 C)  SpO2: 100%   Filed Weights   01/21/20 1125  Weight: 194 lb 3.2 oz (88.1 kg)      Physical Exam Constitutional:      Appearance: Normal appearance. She is normal weight.  Cardiovascular:     Rate and Rhythm: Normal rate and regular rhythm.     Heart sounds: Normal heart sounds.  Pulmonary:     Effort: Pulmonary effort is normal.     Breath sounds: Normal breath sounds.  Abdominal:     General: Bowel sounds are normal.     Palpations: Abdomen is soft.  Musculoskeletal:        General: Normal range of motion.  Skin:    General: Skin is warm.  Neurological:     Mental Status: She is alert and oriented to person, place, and time. Mental status is at baseline.  Psychiatric:        Mood and Affect: Mood normal.        Behavior: Behavior normal.        Thought Content: Thought content normal.        Judgment: Judgment normal.      LABORATORY DATA:  I have reviewed the labs as listed.  CBC    Component Value Date/Time   WBC 4.7 01/21/2020 1055   RBC 3.11 (L) 01/21/2020 1055   HGB 9.2 (L) 01/21/2020 1055   HGB 9.6 (L) 01/28/2019 1218   HCT 30.2 (L) 01/21/2020 1055   HCT  29.9 (L) 01/28/2019 1218   PLT 162 01/21/2020 1055   PLT 199 01/28/2019 1218   MCV 97.1 01/21/2020 1055   MCV 90 01/28/2019 1218   MCH 29.6 01/21/2020 1055   MCHC 30.5 01/21/2020 1055   RDW 16.3 (H) 01/21/2020 1055   RDW 17.1 (H) 01/28/2019 1218   LYMPHSABS 0.7 01/21/2020 1055   LYMPHSABS 0.7 01/28/2019 1218   MONOABS 0.4 01/21/2020 1055   EOSABS 0.0 01/21/2020 1055   EOSABS 0.0 01/28/2019 1218   BASOSABS 0.0 01/21/2020 1055   BASOSABS 0.0 01/28/2019 1218   CMP Latest Ref Rng & Units 01/21/2020 01/05/2020 01/02/2020  Glucose 70 - 99 mg/dL 88 90 96  BUN 8 - 23 mg/dL 43(H) 44(H) 43(H)  Creatinine 0.44 - 1.00 mg/dL 1.34(H) 1.59(H) 1.46(H)  Sodium 135 - 145 mmol/L 138 138  134(L)  Potassium 3.5 - 5.1 mmol/L 5.0 4.2 4.6  Chloride 98 - 111 mmol/L 98 99 98  CO2 22 - 32 mmol/L 29 31 29   Calcium 8.9 - 10.3 mg/dL 9.5 9.7 9.1  Total Protein 6.5 - 8.1 g/dL 7.8 7.4 7.7  Total Bilirubin 0.3 - 1.2 mg/dL 0.4 0.6 0.5  Alkaline Phos 38 - 126 U/L 50 - 52  AST 15 - 41 U/L 31 20 22   ALT 0 - 44 U/L 20 9 13     All questions were answered to patient's stated satisfaction. Encouraged patient to call with any new concerns or questions before his next visit to the cancer center and we can certain see him sooner, if needed.     ASSESSMENT & PLAN:  Iron deficiency 1.  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 -She has been on Procrit that initially started 3 years ago, with overall stability in her hemoglobin. -She is now on Procrit injections 40,000 units every 4 weeks. -She is taking iron tablets twice daily and tolerating them well. -Labs on 01/21/2020 showed hemoglobin 9.2, platelets 162, ferritin and iron panel pending -She will continue the Procrit injections every 4 weeks at this time. -She will follow-up in 3 months with repeat labs  2.  Chronic kidney disease: -Labs on 01/21/2020 showed creatinine 1.34 -Continue to follow-up with nephrology.       Orders placed this encounter:  Orders Placed This Encounter  Procedures  . Lactate dehydrogenase  . CBC with Differential/Platelet  . Comprehensive metabolic panel  . Ferritin  . Iron and TIBC  . Vitamin B12  . VITAMIN D 25 Hydroxy (Vit-D Deficiency, Fractures)  . Folate     Francene Finders, FNP-C [place] [phone]

## 2020-01-26 ENCOUNTER — Encounter (HOSPITAL_COMMUNITY): Payer: Self-pay | Admitting: *Deleted

## 2020-01-26 ENCOUNTER — Emergency Department (HOSPITAL_COMMUNITY)
Admission: EM | Admit: 2020-01-26 | Discharge: 2020-01-27 | Disposition: A | Discharge status: Home / Self Care | Payer: Medicare Other | Attending: Emergency Medicine | Admitting: Emergency Medicine

## 2020-01-26 ENCOUNTER — Other Ambulatory Visit: Payer: Self-pay

## 2020-01-26 ENCOUNTER — Emergency Department (HOSPITAL_COMMUNITY): Payer: Medicare Other

## 2020-01-26 DIAGNOSIS — Z20822 Contact with and (suspected) exposure to covid-19: Secondary | ICD-10-CM | POA: Diagnosis not present

## 2020-01-26 DIAGNOSIS — Z79899 Other long term (current) drug therapy: Secondary | ICD-10-CM | POA: Diagnosis not present

## 2020-01-26 DIAGNOSIS — E039 Hypothyroidism, unspecified: Secondary | ICD-10-CM | POA: Diagnosis not present

## 2020-01-26 DIAGNOSIS — Z950 Presence of cardiac pacemaker: Secondary | ICD-10-CM | POA: Insufficient documentation

## 2020-01-26 DIAGNOSIS — Z87891 Personal history of nicotine dependence: Secondary | ICD-10-CM | POA: Diagnosis not present

## 2020-01-26 DIAGNOSIS — Z7901 Long term (current) use of anticoagulants: Secondary | ICD-10-CM | POA: Diagnosis not present

## 2020-01-26 DIAGNOSIS — N184 Chronic kidney disease, stage 4 (severe): Secondary | ICD-10-CM | POA: Diagnosis not present

## 2020-01-26 DIAGNOSIS — E1122 Type 2 diabetes mellitus with diabetic chronic kidney disease: Secondary | ICD-10-CM | POA: Insufficient documentation

## 2020-01-26 DIAGNOSIS — K5900 Constipation, unspecified: Secondary | ICD-10-CM | POA: Diagnosis not present

## 2020-01-26 DIAGNOSIS — Z9981 Dependence on supplemental oxygen: Secondary | ICD-10-CM | POA: Diagnosis not present

## 2020-01-26 DIAGNOSIS — R531 Weakness: Secondary | ICD-10-CM | POA: Diagnosis not present

## 2020-01-26 DIAGNOSIS — R110 Nausea: Secondary | ICD-10-CM

## 2020-01-26 DIAGNOSIS — J449 Chronic obstructive pulmonary disease, unspecified: Secondary | ICD-10-CM | POA: Insufficient documentation

## 2020-01-26 DIAGNOSIS — I130 Hypertensive heart and chronic kidney disease with heart failure and stage 1 through stage 4 chronic kidney disease, or unspecified chronic kidney disease: Secondary | ICD-10-CM | POA: Diagnosis not present

## 2020-01-26 DIAGNOSIS — I5033 Acute on chronic diastolic (congestive) heart failure: Secondary | ICD-10-CM | POA: Insufficient documentation

## 2020-01-26 LAB — CBC WITH DIFFERENTIAL/PLATELET
Abs Immature Granulocytes: 0.03 10*3/uL (ref 0.00–0.07)
Basophils Absolute: 0 10*3/uL (ref 0.0–0.1)
Basophils Relative: 0 %
Eosinophils Absolute: 0 10*3/uL (ref 0.0–0.5)
Eosinophils Relative: 0 %
HCT: 28.9 % — ABNORMAL LOW (ref 36.0–46.0)
Hemoglobin: 8.8 g/dL — ABNORMAL LOW (ref 12.0–15.0)
Immature Granulocytes: 1 %
Lymphocytes Relative: 10 %
Lymphs Abs: 0.6 10*3/uL — ABNORMAL LOW (ref 0.7–4.0)
MCH: 29.5 pg (ref 26.0–34.0)
MCHC: 30.4 g/dL (ref 30.0–36.0)
MCV: 97 fL (ref 80.0–100.0)
Monocytes Absolute: 0.5 10*3/uL (ref 0.1–1.0)
Monocytes Relative: 9 %
Neutro Abs: 5.1 10*3/uL (ref 1.7–7.7)
Neutrophils Relative %: 80 %
Platelets: 169 10*3/uL (ref 150–400)
RBC: 2.98 MIL/uL — ABNORMAL LOW (ref 3.87–5.11)
RDW: 16.5 % — ABNORMAL HIGH (ref 11.5–15.5)
WBC: 6.3 10*3/uL (ref 4.0–10.5)
nRBC: 0 % (ref 0.0–0.2)

## 2020-01-26 LAB — COMPREHENSIVE METABOLIC PANEL
ALT: 17 U/L (ref 0–44)
AST: 25 U/L (ref 15–41)
Albumin: 3.7 g/dL (ref 3.5–5.0)
Alkaline Phosphatase: 48 U/L (ref 38–126)
Anion gap: 11 (ref 5–15)
BUN: 49 mg/dL — ABNORMAL HIGH (ref 8–23)
CO2: 27 mmol/L (ref 22–32)
Calcium: 8.8 mg/dL — ABNORMAL LOW (ref 8.9–10.3)
Chloride: 95 mmol/L — ABNORMAL LOW (ref 98–111)
Creatinine, Ser: 1.55 mg/dL — ABNORMAL HIGH (ref 0.44–1.00)
GFR calc Af Amer: 35 mL/min — ABNORMAL LOW (ref >60)
GFR calc non Af Amer: 30 mL/min — ABNORMAL LOW (ref >60)
Glucose, Bld: 101 mg/dL — ABNORMAL HIGH (ref 70–99)
Potassium: 5.1 mmol/L (ref 3.5–5.1)
Sodium: 133 mmol/L — ABNORMAL LOW (ref 135–145)
Total Bilirubin: 0.7 mg/dL (ref 0.3–1.2)
Total Protein: 7.4 g/dL (ref 6.5–8.1)

## 2020-01-26 LAB — URINALYSIS, ROUTINE W REFLEX MICROSCOPIC
Bilirubin Urine: NEGATIVE
Glucose, UA: NEGATIVE mg/dL
Hgb urine dipstick: NEGATIVE
Ketones, ur: NEGATIVE mg/dL
Leukocytes,Ua: NEGATIVE
Nitrite: NEGATIVE
Protein, ur: NEGATIVE mg/dL
Specific Gravity, Urine: 1.01 (ref 1.005–1.030)
pH: 5 (ref 5.0–8.0)

## 2020-01-26 LAB — PROTIME-INR
INR: 2.3 — ABNORMAL HIGH (ref 0.8–1.2)
Prothrombin Time: 24.8 seconds — ABNORMAL HIGH (ref 11.4–15.2)

## 2020-01-26 LAB — BRAIN NATRIURETIC PEPTIDE: B Natriuretic Peptide: 489 pg/mL — ABNORMAL HIGH (ref 0.0–100.0)

## 2020-01-26 LAB — POC SARS CORONAVIRUS 2 AG -  ED: SARS Coronavirus 2 Ag: NEGATIVE

## 2020-01-26 LAB — LIPASE, BLOOD: Lipase: 47 U/L (ref 11–51)

## 2020-01-26 LAB — TROPONIN I (HIGH SENSITIVITY)
Troponin I (High Sensitivity): 33 ng/L — ABNORMAL HIGH (ref <18)
Troponin I (High Sensitivity): 37 ng/L — ABNORMAL HIGH (ref <18)

## 2020-01-26 IMAGING — CR PORTABLE CHEST - 1 VIEW
1 series · 4 of 4 positions shown · non-contrast
Comparison: October 14, 2018

CLINICAL DATA: Shortness of breath and left shoulder blade/left arm
pain. History of COPD.

EXAM:
PORTABLE CHEST 1 VIEW

[Series 1: portable · 0.17mm/px · 4 of 4 slices shown]
[im 1/4]
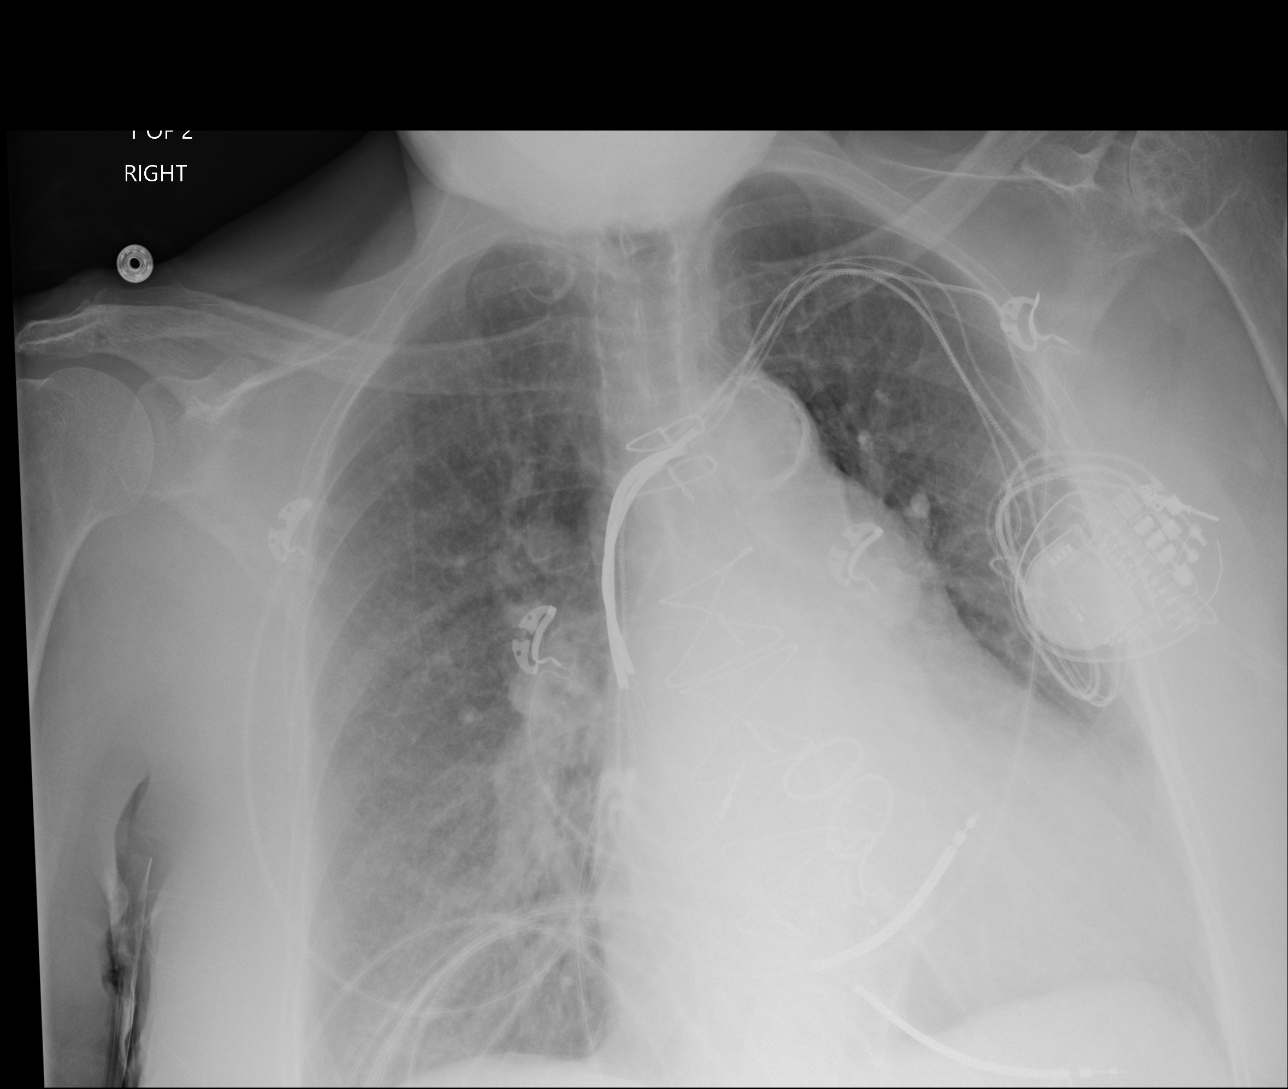
[im 2/4]
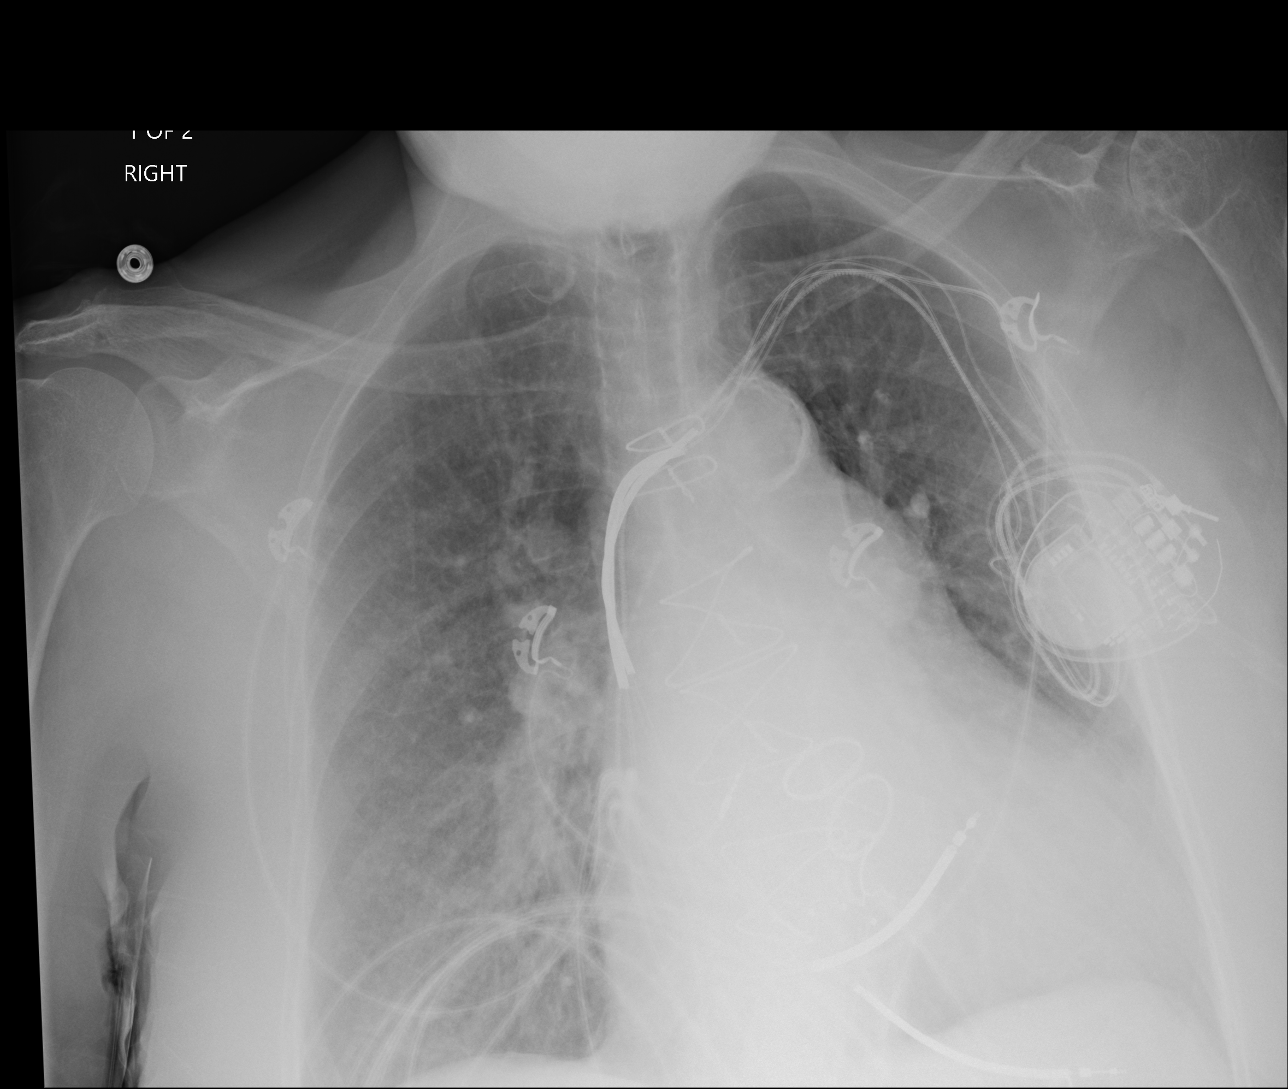
[im 3/4]
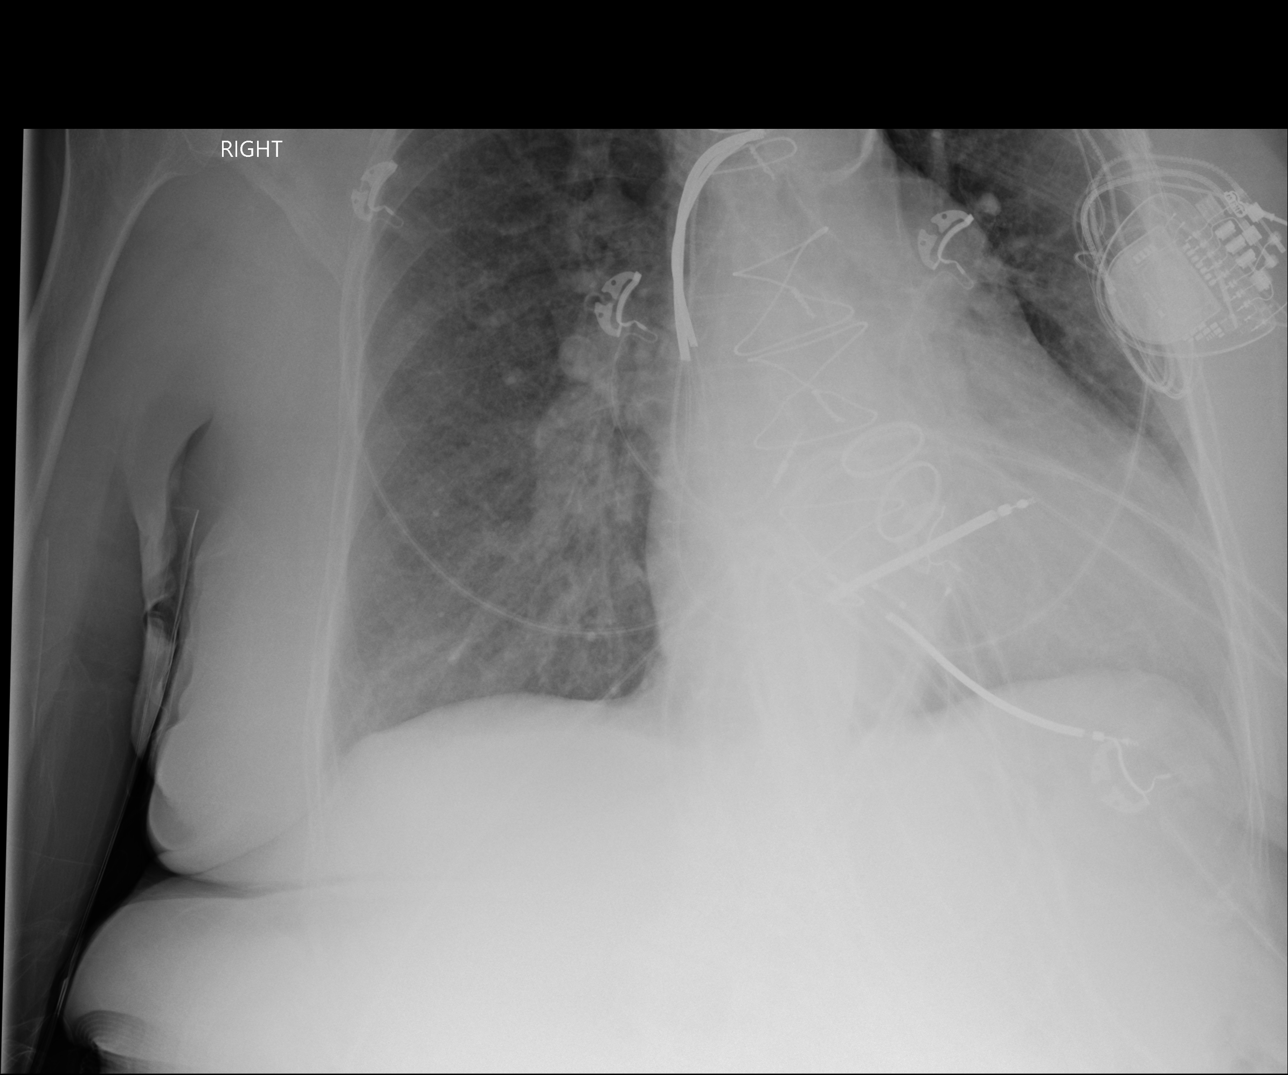
[im 4/4]
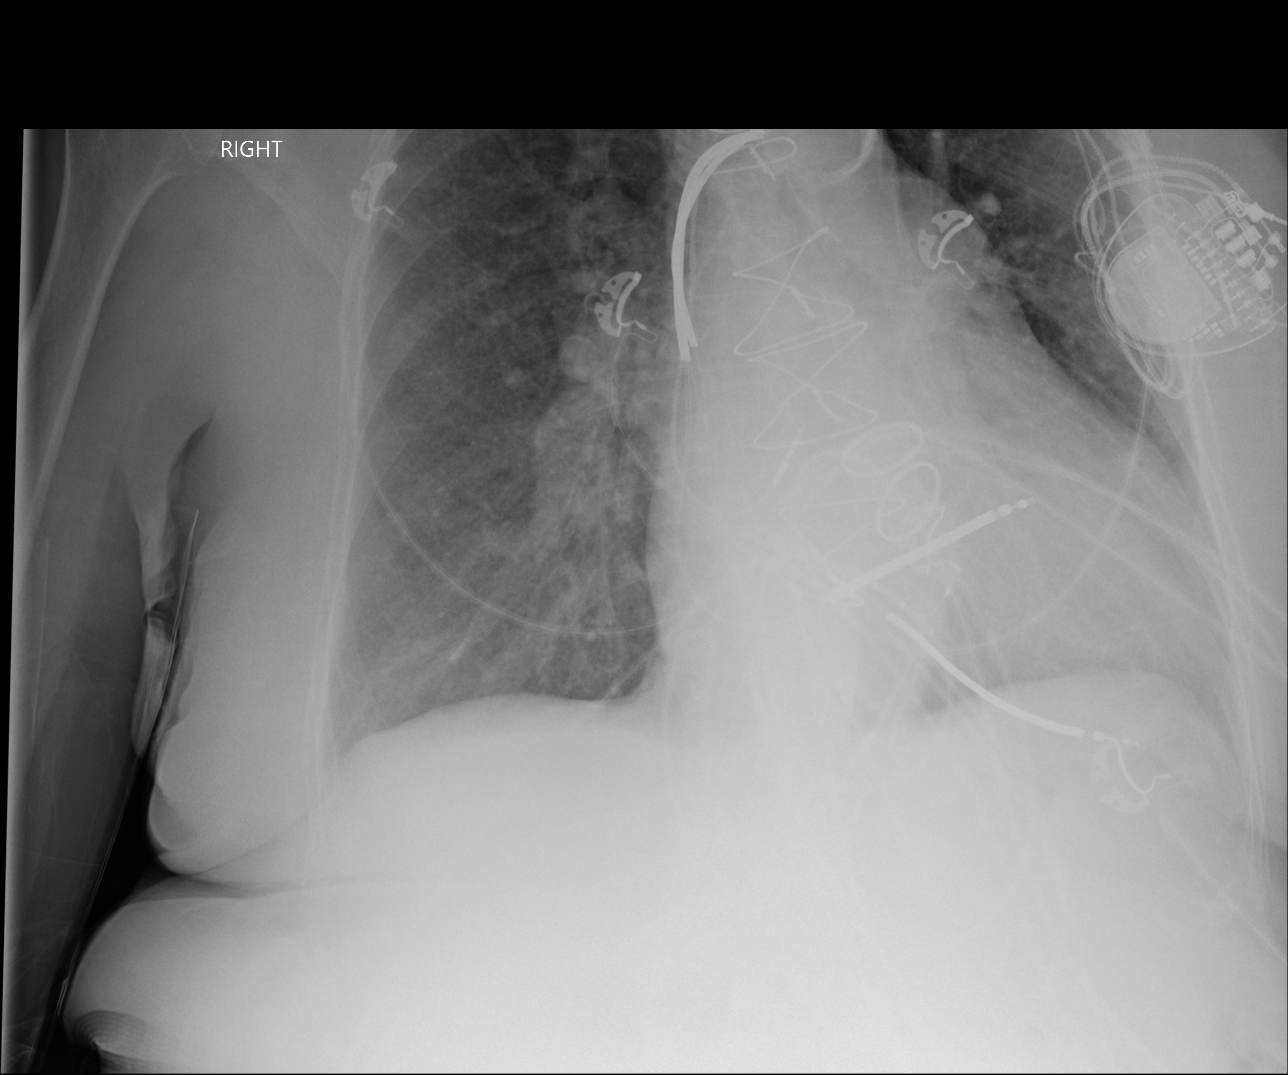

[4 of 4 positions shown; findings below may reference images not displayed]

FINDINGS: The heart size is enlarged. There is a multi lead left-sided
pacemaker. There is vascular congestion. The patient is status post
prior median sternotomy. The lungs are hyperexpanded. There is no
pneumothorax. There may be trace bilateral pleural effusions. There
are advanced degenerative changes of the left glenohumeral joint.
IMPRESSION: 1. Cardiomegaly with vascular congestion.
2. Hyperexpanded lungs which can be seen in patients with COPD.
3. Advanced degenerative changes of the left glenohumeral joint.

## 2020-01-26 MED ORDER — ONDANSETRON HCL 4 MG/2ML IJ SOLN
2.0000 mg | Freq: Once | INTRAMUSCULAR | Status: DC
  Filled 2020-01-26: qty 2

## 2020-01-26 MED ORDER — ONDANSETRON 4 MG PO TBDP
4.0000 mg | ORAL_TABLET | Freq: Once | ORAL | Status: AC
  Administered 2020-01-26: 21:00:00 4 mg via ORAL
  Filled 2020-01-26: qty 1

## 2020-01-26 MED ORDER — FAMOTIDINE 20 MG PO TABS
20.0000 mg | ORAL_TABLET | Freq: Once | ORAL | Status: AC
  Administered 2020-01-26: 23:00:00 20 mg via ORAL
  Filled 2020-01-26: qty 1

## 2020-01-26 MED ORDER — FAMOTIDINE 20 MG PO TABS
20.0000 mg | ORAL_TABLET | Freq: Two times a day (BID) | ORAL | 0 refills | Status: DC
Start: 2020-01-26 — End: 2020-01-31

## 2020-01-26 MED ORDER — ONDANSETRON 4 MG PO TBDP
4.0000 mg | ORAL_TABLET | Freq: Three times a day (TID) | ORAL | 0 refills | Status: DC | PRN
Start: 2020-01-26 — End: 2020-07-08

## 2020-01-26 NOTE — ED Notes (Signed)
[employer] pacermaker interrogated.

## 2020-01-26 NOTE — ED Provider Notes (Signed)
  Care assumed from [facility], PA-C at shift change with re-evaluation pending.   In brief, this patient is a 86 y.o. F with PMH/o COPD (on 2 L supplemental oxygen at home), CHF, CKD, aortic and mitral valve replacement who is on warfarin who presents for evaluation of generalized weakness. She reporst that she feels unwell and "sick to my stomach."  She denies any abdominal pain, vomiting.  She also has been constipated but he took MiraLAX and prune juice and did have a bowel movement.  Please see note from previous provider for full history/physical exam.  Physical Exam  BP (!) 179/73   Pulse (!) 59   Temp 98.5 F (36.9 C) (Oral)   Resp (!) 32   Ht 5\' 7"  (1.702 m)   Wt 87.5 kg   SpO2 93%   BMI 30.23 kg/m   Physical Exam  ED Course/Procedures     Procedures  MDM   PLAN: Patient is pending the [employer] tech coming to evaluate patient.  On review of her pacemaker interrogation, there was concern that there are some left ventricular threshold that is elevated.  The [employer] tech is coming to adjust her.  Plan to discharge home afterwards.  MDM:  Covid negative.  Troponin is 37 which is consistent with her baseline.  UA negative for any infectious etiology.  BNP is 489.  Lipase is unremarkable.  CMP shows bicarb of 27.  BUN of 49 and creatinine of 1.55.  CBC shows no leukocytosis.  Hemoglobin stable at 8.8.  I discussed with [employer] tech who came in examination patient's pacemaker.  They found that it was calibrated correctly and did not need any adjustments.  Discussed with patient.  Instructed patient to follow-up with cardiology.  She sees Dr. Lovena Le.  Patient states she is ready to go home.  Patient will be discharged per previous providers instructions. At this time, patient exhibits no emergent life-threatening condition that require further evaluation in ED. Patient had ample opportunity for questions and discussion. All patient's questions were answered with full  understanding. Strict return precautions discussed. Patient expresses understanding and agreement to plan.   1. Nausea   2. Constipation, unspecified constipation type     Portions of this note were generated with [employer]. Dictation errors may occur despite best attempts at proofreading.    Volanda Napoleon, PA-C 01/27/20 0004    Truddie Hidden, MD 01/27/20 [phone]

## 2020-01-26 NOTE — ED Provider Notes (Signed)
[facility] EMERGENCY DEPARTMENT Provider Note   CSN: [id number] Arrival date & time: 01/26/20  1718     History Chief Complaint  Patient presents with  . Nausea    [name] [name] is a 86 y.o. female with history of COPD on 2 L supplemental oxygen at home, CHF (the last EF 55% on echo performed 03/18/2019), CKD, obesity, status post aortic and mitral valve replacement on warfarin presenting for evaluation of acute onset, persistent generalized weakness.  She reports that she generally feels unwell and "sick to my stomach".  She denies vomiting or abdominal pain.  She has felt constipated over the last 4 days.  Last night she took MiraLAX and drink prune juice and had a watery nonbloody bowel movement last night and had another value this morning.  No fevers or chills.  She denies shortness of breath outside of her baseline or new cough.  She denies chest pains.  No known sick contacts.  Her niece at the bedside does note that she appears a bit more dyspneic than baseline and that her lower extremity edema is a little bit worse than baseline as well.  The history is provided by the patient, medical records and a relative.       Past Medical History:  Diagnosis Date  . Acute on chronic diastolic CHF (congestive heart failure) ([employer]) 05/19/2012  . Adenomatous polyp of colon 12/16/2002   Dr. [name]/[facility]  . Allergy   . Calculus of gallbladder with chronic cholecystitis without obstruction 02/09/2009   Qualifier: Diagnosis of  By: [name], [name]    . Cataract   . CHB (complete heart block) ([place]) 10/07/2014      . CHF (congestive heart failure) ([place])    a.  EF previously reduced to 25-30% b. EF improved to 60-65% by echo in 10/2017.   Marland Kitchen Chronic kidney disease, stage I 2006  . Constipation       . COPD (chronic obstructive pulmonary disease) ([place])       . DJD (degenerative joint disease) of lumbar spine   . DM (diabetes mellitus) ([place]) 2006  .  Esophageal reflux   . Glaucoma   . Gout       . H/O adenomatous polyp of colon 05/24/2012   2004 in [place] colonoscopy 2008 normal    . H/O heart valve replacement with mechanical valve    a. s/p mechanical AVR in 2001 and mechanical MVR in 2004.  Marland Kitchen H/O wisdom tooth extraction   . HIP PAIN, RIGHT 04/23/2009   Qualifier: Diagnosis of  By: [name], [name]    . Hyperlipemia   . IDA (iron deficiency anemia)    Parenteral iron/[name]  . Insomnia       . Nausea without vomiting 06/07/2010   Qualifier: Diagnosis of  By: [name], [name]    . Non-ischemic cardiomyopathy ([place])    a. s/p MDT CRTD  . Obesity, unspecified   . Other and unspecified hyperlipidemia   . Oxygen deficiency 2014   nocturnal;  . Papilloma of breast 03/06/2012   This was diagnosed as a left breast papilloma by ultrasound characteristics and symptoms in 2010. Because of possible medical risk factors no surgical intervention has been done and it did doing annual followups. The area has been stable by physical examination and mammograms and ultrasound since 2010.   Marland Kitchen Spinal stenosis of lumbar region 02/19/2013  . Spondylolisthesis   . Spondylosis   . Thyroid  cancer [facility])    remote thyroidectomy, no recurrence, pt denies in 2016 thyroid cancer  . Type 2 diabetes mellitus with hyperglycemia ([place]) 07/15/2010  . Unspecified hypothyroidism       . 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 Vertigo         Patient Active Problem List   Diagnosis Date Noted  . Acute pain of left shoulder 01/15/2020  . AAA (abdominal aortic aneurysm) without rupture ([place]) 01/14/2020  . COVID-19 virus detected 11/23/2019  . Dyspnea 05/30/2019  . Anemia 05/30/2019  . Gout   . Glaucoma   . GERD (gastroesophageal reflux disease)   . Physical debility 04/28/2019  . Encounter for examination following treatment at hospital 11/03/2018  . Hyponatremia 09/27/2018  . H/O mitral valve replacement with  mechanical valve 08/02/2018  . Chronic right hip pain 04/16/2018  . Trochanteric bursitis, right hip 03/07/2017  . Persistent atrial fibrillation ([place])   . Nocturnal hypoxia 10/08/2016  . Dependence on nocturnal oxygen therapy 10/08/2016  . Hematuria 03/20/2016  . S/P ICD (internal cardiac defibrillator) procedure 02/08/2015  . Presence of cardiac pacemaker 02/08/2015  . CHB (complete heart block) ([place]) 10/07/2014  . Fatigue 07/20/2014  . Absolute anemia 06/24/2014  . Osteopenia 02/10/2014  . Encounter for therapeutic drug monitoring 10/29/2013  . OA (osteoarthritis) of knee 02/19/2013  . Chronic pain of right knee 01/02/2013  . Hemolytic anemia ([place]) 09/24/2012  . High risk medication use 05/24/2012  . COPD (chronic obstructive pulmonary disease) ([place]) 05/19/2012  . Chronic anticoagulation, on coumadin for Mechanical AVR and MVR 04/01/2012  . Cardiorenal syndrome with renal failure 03/22/2012  . Hx of aortic valve replacement, mechanical 03/22/2012  . S/P MVR (mitral valve replacement) 03/22/2012  . Biventricular automatic implantable cardioverter defibrillator in situ 03/22/2012  . CKD (chronic kidney disease), stage IV ([place]) 03/22/2012  . Warfarin-induced coagulopathy ([place]) 03/21/2012  . Iron deficiency 10/04/2011  . Allergic rhinitis 02/14/2011  . Hypothyroidism 07/15/2010  . Mitral valve disease 07/15/2010  . Sinus node dysfunction ([place]) 07/15/2010  . VARICOSE VEINS LOWER EXTREMITIES W/OTH COMPS 02/09/2009  . Prediabetes 06/15/2008  . Hypothyroidism, postsurgical 02/24/2008  . Hyperlipidemia LDL goal <70 02/24/2008  . Essential hypertension 02/24/2008  . Non-ischemic cardiomyopathy with ICD 02/24/2008  . DEGENERATIVE JOINT DISEASE, SPINE 02/24/2008    Past Surgical History:  Procedure Laterality Date  . AORTIC AND MITRAL VALVE REPLACEMENT  2001  . BI-VENTRICULAR IMPLANTABLE CARDIOVERTER DEFIBRILLATOR UPGRADE N/A 01/18/2015   Procedure: BI-VENTRICULAR IMPLANTABLE  CARDIOVERTER DEFIBRILLATOR UPGRADE;  Surgeon: Evans Lance, MD;  Location: [facility] CATH LAB;  Service: Cardiovascular;  Laterality: N/A;  . BIV ICD GENERATOR CHANGEOUT N/A 01/30/2019   Procedure: BIV ICD GENERATOR CHANGEOUT;  Surgeon: Evans Lance, MD;  Location: [place] CV LAB;  Service: Cardiovascular;  Laterality: N/A;  . CARDIAC CATHETERIZATION    . CARDIAC VALVE REPLACEMENT    . CARDIOVERSION N/A 11/13/2016   Procedure: CARDIOVERSION;  Surgeon: Sanda Klein, MD;  Location: [facility] ENDOSCOPY;  Service: Cardiovascular;  Laterality: N/A;  . COLONOSCOPY  06/12/2007   Rourk- Normal rectum/Normal colon  . COLONOSCOPY  12/16/02   small hemorrhoids  . COLONOSCOPY  06/20/2012   Procedure: COLONOSCOPY;  Surgeon: Daneil Dolin, MD;  Location: [facility] ENDO SUITE;  Service: Endoscopy;  Laterality: N/A;  10:45  . defibrillator implanted 2006    . DOPPLER ECHOCARDIOGRAPHY  2012  . DOPPLER ECHOCARDIOGRAPHY  05,06,07,08,09,2011  . ESOPHAGOGASTRODUODENOSCOPY  06/12/2007   Rourk- normal esophagus, small hiatal hernia,  otherwise normal stomach, D1, D2  . EYE SURGERY Right 2005  . EYE SURGERY Left 2006  . ICD LEAD REMOVAL Left 02/08/2015   Procedure: ICD LEAD REMOVAL;  Surgeon: Evans Lance, MD;  Location: [facility] OR;  Service: Cardiovascular;  Laterality: Left;  "Will plan extraction and insertion of a BiV PM"  **Dr. Roxan Hockey backing up case**  . IMPLANTABLE CARDIOVERTER DEFIBRILLATOR (ICD) GENERATOR CHANGE Left 02/08/2015   Procedure: ICD GENERATOR CHANGE;  Surgeon: Evans Lance, MD;  Location: [place];  Service: Cardiovascular;  Laterality: Left;  . INSERT / REPLACE / REMOVE PACEMAKER    . PACEMAKER INSERTION  May 2016  . pacemaker placed  2004  . right breast cyst removed     benign   . right cataract removed     2005  . TEE WITHOUT CARDIOVERSION N/A 11/13/2016   Procedure: TRANSESOPHAGEAL ECHOCARDIOGRAM (TEE);  Surgeon: Sanda Klein, MD;  Location: [facility] ENDOSCOPY;  Service: Cardiovascular;  Laterality:  N/A;  . THYROIDECTOMY       OB History    Gravida      Para      Term      Preterm      AB      Living  0     SAB      TAB      Ectopic      Multiple      Live Births              Family History  Problem Relation Age of Onset  . Pancreatic cancer Mother   . Heart disease Father   . Heart disease Sister   . Heart attack Sister   . Heart disease Brother   . Diabetes Brother   . Heart disease Brother   . Diabetes Brother   . Hypertension Brother   . Lung cancer Brother     Social History   Tobacco Use  . Smoking status: Former Smoker    Packs/day: 0.75    Years: 40.00    Pack years: 30.00    Types: Cigarettes    Quit date: 03/08/1987    Years since quitting: 32.9  . Smokeless tobacco: Never Used  Substance Use Topics  . Alcohol use: No    Alcohol/week: 0.0 standard drinks  . Drug use: No    Home Medications Prior to Admission medications   Medication Sig Start Date End Date Taking? Authorizing Provider  acetaminophen (TYLENOL) 500 MG tablet Take 500 mg by mouth every 6 (six) hours as needed (pain).   Yes [provider]  albuterol (PROVENTIL) (2.5 MG/3ML) 0.083% nebulizer solution 90INHALE 1 VIAL VIA NEBULIZER THREE TIMES DAILY. Patient taking differently: Take 2.5 mg by nebulization in the morning and at bedtime. *May use additional one time if needed for shortness of breath 01/12/20  Yes Fayrene Helper, MD  albuterol (VENTOLIN HFA) 108 (90 Base) MCG/ACT inhaler INHALE 2 PUFFS INTO THE LUNGS EVERY 6 HOURS AS NEEDED FOR WHEEZING OR SHORTNESS OF BREATH 08/25/19  Yes Fayrene Helper, MD  allopurinol (ZYLOPRIM) 100 MG tablet TAKE 1 TABLET(100 MG) BY MOUTH DAILY Patient taking differently: Take 100 mg by mouth in the morning.  12/30/19  Yes Fayrene Helper, MD  BREO ELLIPTA 100-25 MCG/INH AEPB 1 TAKE 1 INHALATION BY MOUTH ONCE DAILY Patient taking differently: Inhale 1 puff into the lungs in the morning.  08/25/19  Yes Fayrene Helper, MD  cholecalciferol (VITAMIN D3) 25 MCG (1000 UT) tablet  Take 1,000 Units by mouth in the morning.    Yes [provider]  docusate sodium (COLACE) 100 MG capsule Take 300 mg by mouth at bedtime.    Yes [provider]  fluticasone (FLONASE) 50 MCG/ACT nasal spray SHAKE LIQUID AND USE 2 SPRAYS IN EACH NOSTRIL DAILY Patient taking differently: Place 2 sprays into both nostrils every morning.  11/24/19  Yes Fayrene Helper, MD  folic acid (FOLVITE) 1 MG tablet TAKE 1 TABLET(1 MG) BY MOUTH DAILY Patient taking differently: Take 1 mg by mouth every evening.  11/10/19  Yes Fayrene Helper, MD  furosemide (LASIX) 40 MG tablet Take 1 tablet (40 mg total) by mouth daily. Patient taking differently: Take 40 mg by mouth every morning.  08/19/19  Yes Fayrene Helper, MD  gabapentin (NEURONTIN) 300 MG capsule Take 1 capsule (300 mg total) by mouth 2 (two) times daily. Patient taking differently: Take 300 mg by mouth at bedtime.  05/31/19  Yes Barton Dubois, MD  hydrALAZINE (APRESOLINE) 25 MG tablet Take 1 tablet (25 mg total) by mouth 2 (two) times daily. 05/19/19  Yes Croitoru, Mihai, MD  isosorbide mononitrate (IMDUR) 30 MG 24 hr tablet Take 1 tablet (30 mg total) by mouth daily. Patient taking differently: Take 15 mg by mouth every morning.  12/29/19 03/28/20 Yes Croitoru, Mihai, MD  Liniments (SALONPAS PAIN RELIEF PATCH EX) Apply 1 patch topically daily as needed (pain).   Yes [provider]  meclizine (ANTIVERT) 12.5 MG tablet Take 1 tablet (12.5 mg total) by mouth 3 (three) times daily as needed for dizziness. 09/02/19  Yes Fayrene Helper, MD  metolazone (ZAROXOLYN) 2.5 MG tablet Take one tablet on Saturdays. Patient taking differently: Take 2.5 mg by mouth every Saturday.  11/24/19  Yes Croitoru, Mihai, MD  metoprolol succinate (TOPROL-XL) 100 MG 24 hr tablet TAKE 1 TABLET BY MOUTH EVERY DAY WITH FOOD OR MILK Patient taking differently: Take 100 mg by mouth  every evening.  11/17/19  Yes Fayrene Helper, MD  Multiple Vitamin (MULTIVITAMIN) LIQD Take 5 mLs by mouth daily.   Yes [provider]  nitroGLYCERIN (NITROSTAT) 0.4 MG SL tablet Place 1 tablet (0.4 mg total) under the tongue every 5 (five) minutes as needed for chest pain. 08/03/19  Yes Rolland Porter, MD  ondansetron (ZOFRAN) 4 MG tablet Take 4 mg by mouth every 8 (eight) hours as needed for nausea or vomiting.   Yes [provider]  Polyethyl [name] (SYSTANE) 0.4-0.3 % GEL ophthalmic gel Place 1 application into the left eye at bedtime.   Yes [provider]  polyethylene glycol powder (GLYCOLAX/MIRALAX) 17 GM/SCOOP powder Take 17 g by mouth daily as needed for mild constipation or moderate constipation.   Yes [provider]  potassium chloride SA (KLOR-CON) 20 MEQ tablet Take 1 tablet (20 mEq total) by mouth daily. Patient taking differently: Take 20 mEq by mouth every morning.  08/01/19  Yes Fayrene Helper, MD  pravastatin (PRAVACHOL) 40 MG tablet TAKE 1 TABLET BY MOUTH EVERY EVENING Patient taking differently: Take 40 mg by mouth every evening.  11/10/19  Yes Fayrene Helper, MD  SYNTHROID 75 MCG tablet TAKE 1 TABLET BY MOUTH DAILY Patient taking differently: Take 75 mcg by mouth daily before breakfast.  10/27/19  Yes Fayrene Helper, MD  warfarin (JANTOVEN) 5 MG tablet TAKE 2 TABLETS EVERY DAY EXCEPT 1 TABLET ON SATURDAY Patient taking differently: Take 5-10 mg by mouth See admin instructions. TAKE  2 TABLETS EVERY DAY EXCEPT 1 TABLET ON Saturday @ 8:30PM 11/10/19  Yes Evans Lance, MD  famotidine (PEPCID) 20 MG tablet Take 1 tablet (20 mg total) by mouth 2 (two) times daily. 01/26/20   Haileyann Staiger A, PA-C  ondansetron (ZOFRAN ODT) 4 MG disintegrating tablet Take 1 tablet (4 mg total) by mouth every 8 (eight) hours as needed for nausea or vomiting. 01/26/20   Naod Sweetland A, PA-C    Allergies    Penicillins, Aspirin, Fentanyl, and  Niacin  Review of Systems   Review of Systems  Constitutional: Positive for fatigue. Negative for chills and fever.  Respiratory: Negative for cough and shortness of breath.   Cardiovascular: Negative for chest pain.  Gastrointestinal: Positive for constipation, diarrhea and nausea. Negative for abdominal pain and vomiting.  Genitourinary: Negative for dysuria, frequency, hematuria and urgency.  Neurological: Positive for weakness (Generalized).  All other systems reviewed and are negative.   Physical Exam Updated Vital Signs BP (!) 149/69   Pulse 61   Temp 98.5 F (36.9 C) (Oral)   Resp (!) 29   Ht 5\' 7"  (1.702 m)   Wt 87.5 kg   SpO2 95%   BMI 30.23 kg/m   Physical Exam Vitals and nursing note reviewed.  Constitutional:      General: She is not in acute distress.    Appearance: She is well-developed.     Comments: Chronically ill in appearance  HENT:     Head: Normocephalic and atraumatic.  Eyes:     General:        Right eye: No discharge.        Left eye: No discharge.     Conjunctiva/sclera: Conjunctivae normal.  Neck:     Vascular: No JVD.     Trachea: No tracheal deviation.  Cardiovascular:     Rate and Rhythm: Normal rate and regular rhythm.     Pulses: Normal pulses.     Comments: Mechanical valve murmur; 2+ pitting edema of the bilateral lower extremities.  2+ radial and DP/PT pulses bilaterally.  Bevelyn Buckles' sign absent bilaterally Pulmonary:     Comments: SPO2 saturations 100% on 2 L supplemental oxygen. Globally diminished breath sounds Abdominal:     General: Abdomen is flat. Bowel sounds are decreased. There is no distension.     Palpations: Abdomen is soft.     Tenderness: There is no abdominal tenderness. There is no right CVA tenderness, left CVA tenderness, guarding or rebound.  Musculoskeletal:     Cervical back: Neck supple.  Skin:    General: Skin is warm and dry.     Findings: No erythema.  Neurological:     Mental Status: She is alert.    Psychiatric:        Behavior: Behavior normal.     ED Results / Procedures / Treatments   Labs (all labs ordered are listed, but only abnormal results are displayed) Labs Reviewed  COMPREHENSIVE METABOLIC PANEL - Abnormal; Notable for the following components:      Result Value   Sodium 133 (*)    Chloride 95 (*)    Glucose, Bld 101 (*)    BUN 49 (*)    Creatinine, Ser 1.55 (*)    Calcium 8.8 (*)    GFR calc non Af Amer 30 (*)    GFR calc Af Amer 35 (*)    All other components within normal limits  CBC WITH DIFFERENTIAL/PLATELET - Abnormal; Notable for the following components:  RBC 2.98 (*)    Hemoglobin 8.8 (*)    HCT 28.9 (*)    RDW 16.5 (*)    Lymphs Abs 0.6 (*)    All other components within normal limits  PROTIME-INR - Abnormal; Notable for the following components:   Prothrombin Time 24.8 (*)    INR 2.3 (*)    All other components within normal limits  BRAIN NATRIURETIC PEPTIDE - Abnormal; Notable for the following components:   B Natriuretic Peptide 489.0 (*)    All other components within normal limits  TROPONIN I (HIGH SENSITIVITY) - Abnormal; Notable for the following components:   Troponin I (High Sensitivity) 33 (*)    All other components within normal limits  TROPONIN I (HIGH SENSITIVITY) - Abnormal; Notable for the following components:   Troponin I (High Sensitivity) 37 (*)    All other components within normal limits  URINE CULTURE  LIPASE, BLOOD  URINALYSIS, ROUTINE W REFLEX MICROSCOPIC  POC SARS CORONAVIRUS 2 AG -  ED    EKG EKG Interpretation  Date/Time:  Monday Jan 26 2020 18:58:32 EDT Ventricular Rate:  60 PR Interval:    QRS Duration: 186 QT Interval:  529 QTC Calculation: 529 R Axis:   30 Text Interpretation: VENTRICULAR PACED RHYTHM Underlying atrial flutter Prolonged QT interval No significant change since last tracing Confirmed by Calvert Cantor [phone]) on 01/26/2020 7:05:55 PM   Radiology DG Abdomen Acute W/Chest  Result  Date: 01/26/2020 CLINICAL DATA:  Nausea EXAM: DG ABDOMEN ACUTE W/ 1V CHEST COMPARISON:  01/02/2020, 08/02/2019 chest radiograph, CT chest 07/30/2018 FINDINGS: Single-view chest demonstrates post sternotomy changes and valve prostheses. Left-sided pacing device as before. Cardiomegaly with enlarged central pulmonary vessels. Vascular congestion with diffuse interstitial and ground-glass opacity. This appears slightly increased compared to prior. Supine and upright views of the abdomen demonstrate no free air beneath the diaphragm. Gas-filled nondilated central small bowel loops with scattered colon gas. Dense vascular calcification. IMPRESSION: 1. Cardiomegaly with central vascular congestion. Enlarged central pulmonary vessels suggesting arterial hypertension. 2. Diffuse bilateral interstitial and ground-glass opacity some of which is chronic though suspect that there is superimposed interstitial process such as edema on the current exam. 3. Overall nonobstructed gas pattern Electronically Signed   By: Donavan Foil M.D.   On: 01/26/2020 19:26    Procedures Procedures (including critical care time)  Medications Ordered in ED Medications  famotidine (PEPCID) tablet 20 mg (has no administration in time range)  ondansetron (ZOFRAN-ODT) disintegrating tablet 4 mg (4 mg Oral Given 01/26/20 2057)    ED Course  I have reviewed the triage vital signs and the nursing notes.  Pertinent labs & imaging results that were available during my care of the patient were reviewed by me and considered in my medical decision making (see chart for details).    MDM Rules/Calculators/A&P                      Patient presenting for evaluation of generalized weakness, constipation and nausea.  Constipation and nausea have improved after taking MiraLAX and drinking prune juice last night.  She is afebrile, vital signs are at patient's baseline.  She is on 2 L supplemental oxygen chronically and is at her usual oxygen  requirement.  Her abdomen is soft and nontender with no rebound or guarding.  Will obtain lab work and plain films of the abdomen and chest and give nausea medicine and reassess.  Lab work reviewed and interpreted by myself is essentially at  patient's baseline. She has chronic anemia, no leukocytosis, renal insufficiency at her baseline.  UA does not suggest UTI or nephrolithiasis.  Serial troponins are mildly elevated but are at baseline and unchanged.  Her BNP is also at baseline.  LFTs and lipase are within normal limits.  Her INR is mildly subtherapeutic but also at baseline.  EKG shows paced rhythm, no acute abnormalities.  Films of the chest and abdomen show nonobstructed bowel gas pattern.  Also shows cardiomegaly with central vascular congestion and enlarged central pulmonary vessels.  She has diffuse bilateral interstitial and groundglass opacities most of which is chronic though there is some degree of possibly superimposed interstitial process.  She remains resting comfortably in no distress on reevaluation, no evidence of respiratory distress.  Doubt PE, dissection, cardiac tamponade, esophageal rupture, pneumothorax.  I have a low suspicion of pneumonia in the absence of fever, cough, or new oxygen requirement.  Patient reports that she is feeling much better after the nausea medicine.  She is tolerating sips of fluids.  Serial abdominal examinations remain benign.  10:10 PM Spoke with Dannial Monarch, [employer] representative.  She states that the patient's device is functioning mostly as expected though states that the left ventricular threshold is elevated and will need to be calibrated.  She notes that the patient has a feature to her device which notes if she passes a specific threshold indicating fluid overload and she has not crossed that threshold.  She had 8 beats of isolated NSVT on 01/23/2020.  She states that since the patient will likely be discharged home that she will need to come to  the emergency department to calibrate the patient's device.  11:07 PM Care signed out to provider PA [place].  Pending device calibration.  If patient remains resting comfortably with stable vital signs and no concerning abdominal examination findings then she will likely be stable for discharge home with prescriptions for Zofran and famotidine and close PCP follow-up.  I have discussed tentative plan with the patient and her niece and they have verbalized understanding of and agreement with plan and patient will likely be stable for discharge home.  Patient was seen and evaluated by Dr. Karle Starch who agrees with assessment and plan at this time Final Clinical Impression(s) / ED Diagnoses Final diagnoses:  Nausea  Constipation, unspecified constipation type    Rx / DC Orders ED Discharge Orders         Ordered    ondansetron (ZOFRAN ODT) 4 MG disintegrating tablet  Every 8 hours PRN     01/26/20 2259    famotidine (PEPCID) 20 MG tablet  2 times daily     01/26/20 2259           Renita Papa, PA-C 01/26/20 2308    Truddie Hidden, MD 01/26/20 2320

## 2020-01-26 NOTE — ED Notes (Signed)
Pt transported to CT ?

## 2020-01-26 NOTE — ED Triage Notes (Signed)
Pt states has been feeling nauseous for the last couple of days; pt states she had been constipated but she has helped get that resolved; pt states "I feel bad"

## 2020-01-26 NOTE — Discharge Instructions (Addendum)
You can take Zofran as needed for nausea.  Let this medicine dissolve under your tongue and wait around 10 or 15 minutes before you have anything to eat or drink to get this medicine time to work.  You can also take Pepcid twice daily with meals.    Slowly increase your food and fluid intake as you start to feel better, but try to prioritize eating small meals throughout the day regularly and drinking lots of water.  Follow-up with your primary care provider for reevaluation of your symptoms.  Return to the emergency department if any concerning signs or symptoms develop such as fevers, vomiting, severe abdominal pains or chest pains, shortness of breath or loss of consciousness.

## 2020-01-28 ENCOUNTER — Other Ambulatory Visit: Payer: Self-pay | Admitting: Family Medicine

## 2020-01-28 ENCOUNTER — Telehealth: Payer: Self-pay

## 2020-01-28 DIAGNOSIS — N184 Chronic kidney disease, stage 4 (severe): Secondary | ICD-10-CM

## 2020-01-28 LAB — URINE CULTURE: Culture: <10000 — AB

## 2020-01-28 NOTE — Telephone Encounter (Signed)
Pt aware.

## 2020-01-28 NOTE — Telephone Encounter (Signed)
You seen patient a few weeks ago and said no referrals. She has seen [facility] for her shoulder pain a few months ago and got injections. Should she call him back or what do you recommend?

## 2020-01-28 NOTE — Telephone Encounter (Signed)
Yes, I recommend Luna Glasgow, if she needs a new referral for this, please place.  Thank you for help.

## 2020-01-28 NOTE — Telephone Encounter (Signed)
Left arm pain Shoulder to the elbow.. She said that someone told her that she needed therapy, and she thought it was one of our Drs.  Do you see this?

## 2020-01-30 ENCOUNTER — Inpatient Hospital Stay (HOSPITAL_COMMUNITY)
Admission: EM | Admit: 2020-01-30 | Discharge: 2020-02-03 | DRG: 291 | Disposition: A | Discharge status: Home Health | Payer: Medicare Other | Attending: Internal Medicine | Admitting: Internal Medicine

## 2020-01-30 ENCOUNTER — Encounter (HOSPITAL_COMMUNITY): Payer: Self-pay | Admitting: Emergency Medicine

## 2020-01-30 ENCOUNTER — Other Ambulatory Visit: Payer: Self-pay

## 2020-01-30 ENCOUNTER — Emergency Department (HOSPITAL_COMMUNITY): Payer: Medicare Other

## 2020-01-30 DIAGNOSIS — D631 Anemia in chronic kidney disease: Secondary | ICD-10-CM | POA: Diagnosis present

## 2020-01-30 DIAGNOSIS — E875 Hyperkalemia: Secondary | ICD-10-CM | POA: Diagnosis not present

## 2020-01-30 DIAGNOSIS — I10 Essential (primary) hypertension: Secondary | ICD-10-CM | POA: Diagnosis present

## 2020-01-30 DIAGNOSIS — R0602 Shortness of breath: Secondary | ICD-10-CM

## 2020-01-30 DIAGNOSIS — Z20822 Contact with and (suspected) exposure to covid-19: Secondary | ICD-10-CM | POA: Diagnosis present

## 2020-01-30 DIAGNOSIS — I5043 Acute on chronic combined systolic (congestive) and diastolic (congestive) heart failure: Secondary | ICD-10-CM | POA: Diagnosis present

## 2020-01-30 DIAGNOSIS — J449 Chronic obstructive pulmonary disease, unspecified: Secondary | ICD-10-CM | POA: Diagnosis present

## 2020-01-30 DIAGNOSIS — Z87891 Personal history of nicotine dependence: Secondary | ICD-10-CM

## 2020-01-30 DIAGNOSIS — M25572 Pain in left ankle and joints of left foot: Secondary | ICD-10-CM | POA: Diagnosis present

## 2020-01-30 DIAGNOSIS — Z952 Presence of prosthetic heart valve: Secondary | ICD-10-CM

## 2020-01-30 DIAGNOSIS — E785 Hyperlipidemia, unspecified: Secondary | ICD-10-CM | POA: Diagnosis present

## 2020-01-30 DIAGNOSIS — Z7901 Long term (current) use of anticoagulants: Secondary | ICD-10-CM

## 2020-01-30 DIAGNOSIS — I4819 Other persistent atrial fibrillation: Secondary | ICD-10-CM | POA: Diagnosis present

## 2020-01-30 DIAGNOSIS — Z801 Family history of malignant neoplasm of trachea, bronchus and lung: Secondary | ICD-10-CM

## 2020-01-30 DIAGNOSIS — J9621 Acute and chronic respiratory failure with hypoxia: Secondary | ICD-10-CM | POA: Diagnosis present

## 2020-01-30 DIAGNOSIS — Z950 Presence of cardiac pacemaker: Secondary | ICD-10-CM | POA: Diagnosis present

## 2020-01-30 DIAGNOSIS — Z880 Allergy status to penicillin: Secondary | ICD-10-CM

## 2020-01-30 DIAGNOSIS — I110 Hypertensive heart disease with heart failure: Secondary | ICD-10-CM | POA: Diagnosis not present

## 2020-01-30 DIAGNOSIS — Z833 Family history of diabetes mellitus: Secondary | ICD-10-CM

## 2020-01-30 DIAGNOSIS — Z79890 Hormone replacement therapy (postmenopausal): Secondary | ICD-10-CM

## 2020-01-30 DIAGNOSIS — Z95810 Presence of automatic (implantable) cardiac defibrillator: Secondary | ICD-10-CM | POA: Diagnosis present

## 2020-01-30 DIAGNOSIS — Z888 Allergy status to other drugs, medicaments and biological substances status: Secondary | ICD-10-CM

## 2020-01-30 DIAGNOSIS — Z9981 Dependence on supplemental oxygen: Secondary | ICD-10-CM

## 2020-01-30 DIAGNOSIS — M109 Gout, unspecified: Secondary | ICD-10-CM | POA: Diagnosis present

## 2020-01-30 DIAGNOSIS — I5033 Acute on chronic diastolic (congestive) heart failure: Secondary | ICD-10-CM

## 2020-01-30 DIAGNOSIS — E890 Postprocedural hypothyroidism: Secondary | ICD-10-CM | POA: Diagnosis present

## 2020-01-30 DIAGNOSIS — I442 Atrioventricular block, complete: Secondary | ICD-10-CM | POA: Diagnosis not present

## 2020-01-30 DIAGNOSIS — I130 Hypertensive heart and chronic kidney disease with heart failure and stage 1 through stage 4 chronic kidney disease, or unspecified chronic kidney disease: Secondary | ICD-10-CM | POA: Diagnosis not present

## 2020-01-30 DIAGNOSIS — N1832 Chronic kidney disease, stage 3b: Secondary | ICD-10-CM | POA: Diagnosis present

## 2020-01-30 DIAGNOSIS — R109 Unspecified abdominal pain: Secondary | ICD-10-CM | POA: Diagnosis present

## 2020-01-30 DIAGNOSIS — K219 Gastro-esophageal reflux disease without esophagitis: Secondary | ICD-10-CM | POA: Diagnosis present

## 2020-01-30 DIAGNOSIS — Z79899 Other long term (current) drug therapy: Secondary | ICD-10-CM

## 2020-01-30 DIAGNOSIS — Z86010 Personal history of colonic polyps: Secondary | ICD-10-CM

## 2020-01-30 DIAGNOSIS — Z885 Allergy status to narcotic agent status: Secondary | ICD-10-CM

## 2020-01-30 DIAGNOSIS — N184 Chronic kidney disease, stage 4 (severe): Secondary | ICD-10-CM | POA: Diagnosis present

## 2020-01-30 DIAGNOSIS — Z800 Family history of malignant neoplasm of digestive organs: Secondary | ICD-10-CM

## 2020-01-30 DIAGNOSIS — Z8249 Family history of ischemic heart disease and other diseases of the circulatory system: Secondary | ICD-10-CM

## 2020-01-30 DIAGNOSIS — I484 Atypical atrial flutter: Secondary | ICD-10-CM | POA: Diagnosis present

## 2020-01-30 DIAGNOSIS — Z7951 Long term (current) use of inhaled steroids: Secondary | ICD-10-CM

## 2020-01-30 DIAGNOSIS — M79672 Pain in left foot: Secondary | ICD-10-CM | POA: Diagnosis present

## 2020-01-30 DIAGNOSIS — Z886 Allergy status to analgesic agent status: Secondary | ICD-10-CM

## 2020-01-30 DIAGNOSIS — E663 Overweight: Secondary | ICD-10-CM | POA: Diagnosis present

## 2020-01-30 DIAGNOSIS — I509 Heart failure, unspecified: Secondary | ICD-10-CM

## 2020-01-30 DIAGNOSIS — I428 Other cardiomyopathies: Secondary | ICD-10-CM | POA: Diagnosis present

## 2020-01-30 DIAGNOSIS — Z6829 Body mass index (BMI) 29.0-29.9, adult: Secondary | ICD-10-CM

## 2020-01-30 LAB — I-STAT CHEM 8, ED
BUN: 33 mg/dL — ABNORMAL HIGH (ref 8–23)
Calcium, Ion: 1.19 mmol/L (ref 1.15–1.40)
Chloride: 97 mmol/L — ABNORMAL LOW (ref 98–111)
Creatinine, Ser: 1.4 mg/dL — ABNORMAL HIGH (ref 0.44–1.00)
Glucose, Bld: 101 mg/dL — ABNORMAL HIGH (ref 70–99)
HCT: 30 % — ABNORMAL LOW (ref 36.0–46.0)
Hemoglobin: 10.2 g/dL — ABNORMAL LOW (ref 12.0–15.0)
Potassium: 4.7 mmol/L (ref 3.5–5.1)
Sodium: 136 mmol/L (ref 135–145)
TCO2: 29 mmol/L (ref 22–32)

## 2020-01-30 MED ORDER — IPRATROPIUM BROMIDE HFA 17 MCG/ACT IN AERS
2.0000 | INHALATION_SPRAY | Freq: Once | RESPIRATORY_TRACT | Status: AC
  Administered 2020-01-31: 2 via RESPIRATORY_TRACT
  Filled 2020-01-30: qty 12.9

## 2020-01-30 MED ORDER — ALBUTEROL SULFATE HFA 108 (90 BASE) MCG/ACT IN AERS
6.0000 | INHALATION_SPRAY | Freq: Once | RESPIRATORY_TRACT | Status: AC
  Administered 2020-01-30: 6 via RESPIRATORY_TRACT
  Filled 2020-01-30: qty 6.7

## 2020-01-30 MED ORDER — AEROCHAMBER Z-STAT PLUS/MEDIUM MISC
1.0000 | Freq: Once | Status: AC
  Administered 2020-01-30: 1
  Filled 2020-01-30: qty 1

## 2020-01-30 NOTE — ED Triage Notes (Signed)
Patient presents with complaints of shortness of breath that have not gotten better since being seen at [facility] a few days ago. Patient states worsening exertional SOB. Patient is on 2L [place] at home and her sats have been 95%+ on her O2. Patient states she has not had to increase O2 demand. Patient hx COPD.

## 2020-01-30 NOTE — ED Provider Notes (Signed)
[facility] DEPT Provider Note  CSN: [id number] Arrival date & time: 01/30/20 2239  Chief Complaint(s) Shortness of Breath and COPD  HPI [name] [name] is a 86 y.o. female with a past medical history listed below including diastolic heart failure on Lasix, COPD on 2 L nasal cannula at home who presents to the emergency department with several days of generalized fatigue and shortness of breath with increased dyspnea on exertion and peripheral edema.  Patient reports that her shortness of breath does improve mildly with albuterol use.  She denies any recent fevers or infections.  No associated chest pain.  No nausea or vomiting.  No abdominal pain.  Patient reports being seen at [facility] several days ago for generalized fatigue and had reassuring work-up.   HPI  Past Medical History Past Medical History:  Diagnosis Date  . Acute on chronic diastolic CHF (congestive heart failure) ([place]) 05/19/2012  . Adenomatous polyp of colon 12/16/2002   Dr. [name]/[facility]  . Allergy   . Calculus of gallbladder with chronic cholecystitis without obstruction 02/09/2009   Qualifier: Diagnosis of  By: [name], [name]    . Cataract   . CHB (complete heart block) ([place]) 10/07/2014      . CHF (congestive heart failure) ([place])    a.  EF previously reduced to 25-30% b. EF improved to 60-65% by echo in 10/2017.   Marland Kitchen Chronic kidney disease, stage I 2006  . Constipation       . COPD (chronic obstructive pulmonary disease) ([place])       . DJD (degenerative joint disease) of lumbar spine   . DM (diabetes mellitus) ([place]) 2006  . Esophageal reflux   . Glaucoma   . Gout       . H/O adenomatous polyp of colon 05/24/2012   2004 in [place] colonoscopy 2008 normal    . H/O heart valve replacement with mechanical valve    a. s/p mechanical AVR in 2001 and mechanical MVR in 2004.  Marland Kitchen H/O wisdom tooth extraction   . HIP PAIN, RIGHT 04/23/2009   Qualifier: Diagnosis of  By: [name], [name]    . Hyperlipemia   . IDA (iron deficiency anemia)    Parenteral iron/[name]  . Insomnia       . Nausea without vomiting 06/07/2010   Qualifier: Diagnosis of  By: [name], [name]    . Non-ischemic cardiomyopathy ([place])    a. s/p MDT CRTD  . Obesity, unspecified   . Other and unspecified hyperlipidemia   . Oxygen deficiency 2014   nocturnal;  . Papilloma of breast 03/06/2012   This was diagnosed as a left breast papilloma by ultrasound characteristics and symptoms in 2010. Because of possible medical risk factors no surgical intervention has been done and it did doing annual followups. The area has been stable by physical examination and mammograms and ultrasound since 2010.   Marland Kitchen Spinal stenosis of lumbar region 02/19/2013  . Spondylolisthesis   . Spondylosis   . Thyroid cancer ([place])    remote thyroidectomy, no recurrence, pt denies in 2016 thyroid cancer  . Type 2 diabetes mellitus with hyperglycemia ([place]) 07/15/2010  . Unspecified hypothyroidism       . 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 Vertigo        Patient Active Problem List   Diagnosis Date Noted  . Acute pain of left shoulder 01/15/2020  . AAA (abdominal aortic  aneurysm) without rupture ([place]) 01/14/2020  . COVID-19 virus detected 11/23/2019  . Dyspnea 05/30/2019  . Anemia 05/30/2019  . Gout   . Glaucoma   . GERD (gastroesophageal reflux disease)   . Physical debility 04/28/2019  . Encounter for examination following treatment at hospital 11/03/2018  . Hyponatremia 09/27/2018  . H/O mitral valve replacement with mechanical valve 08/02/2018  . Chronic right hip pain 04/16/2018  . Trochanteric bursitis, right hip 03/07/2017  . Persistent atrial fibrillation ([place])   . Nocturnal hypoxia 10/08/2016  . Dependence on nocturnal oxygen therapy 10/08/2016  . Hematuria 03/20/2016  . S/P ICD (internal cardiac defibrillator) procedure  02/08/2015  . Presence of cardiac pacemaker 02/08/2015  . CHB (complete heart block) ([place]) 10/07/2014  . Fatigue 07/20/2014  . Absolute anemia 06/24/2014  . Osteopenia 02/10/2014  . Encounter for therapeutic drug monitoring 10/29/2013  . OA (osteoarthritis) of knee 02/19/2013  . Chronic pain of right knee 01/02/2013  . Hemolytic anemia ([place]) 09/24/2012  . High risk medication use 05/24/2012  . COPD (chronic obstructive pulmonary disease) ([place]) 05/19/2012  . Chronic anticoagulation, on coumadin for Mechanical AVR and MVR 04/01/2012  . Cardiorenal syndrome with renal failure 03/22/2012  . Hx of aortic valve replacement, mechanical 03/22/2012  . S/P MVR (mitral valve replacement) 03/22/2012  . Biventricular automatic implantable cardioverter defibrillator in situ 03/22/2012  . CKD (chronic kidney disease), stage IV ([place]) 03/22/2012  . Warfarin-induced coagulopathy ([place]) 03/21/2012  . Iron deficiency 10/04/2011  . Allergic rhinitis 02/14/2011  . Hypothyroidism 07/15/2010  . Mitral valve disease 07/15/2010  . Sinus node dysfunction ([place]) 07/15/2010  . VARICOSE VEINS LOWER EXTREMITIES W/OTH COMPS 02/09/2009  . Prediabetes 06/15/2008  . Hypothyroidism, postsurgical 02/24/2008  . Hyperlipidemia LDL goal <70 02/24/2008  . Essential hypertension 02/24/2008  . Non-ischemic cardiomyopathy with ICD 02/24/2008  . DEGENERATIVE JOINT DISEASE, SPINE 02/24/2008   Home Medication(s) Prior to Admission medications   Medication Sig Start Date End Date Taking? Authorizing Provider  acetaminophen (TYLENOL) 500 MG tablet Take 500 mg by mouth every 6 (six) hours as needed (pain).   Yes [provider]  albuterol (PROVENTIL) (2.5 MG/3ML) 0.083% nebulizer solution 90INHALE 1 VIAL VIA NEBULIZER THREE TIMES DAILY. Patient taking differently: Take 2.5 mg by nebulization in the morning and at bedtime. *May use additional one time if needed for shortness of breath 01/12/20  Yes Fayrene Helper, MD    albuterol (VENTOLIN HFA) 108 (90 Base) MCG/ACT inhaler INHALE 2 PUFFS INTO THE LUNGS EVERY 6 HOURS AS NEEDED FOR WHEEZING OR SHORTNESS OF BREATH Patient taking differently: Inhale 1-2 puffs into the lungs every 6 (six) hours as needed for wheezing or shortness of breath.  08/25/19  Yes Fayrene Helper, MD  allopurinol (ZYLOPRIM) 100 MG tablet TAKE 1 TABLET(100 MG) BY MOUTH DAILY Patient taking differently: Take 100 mg by mouth in the morning.  12/30/19  Yes Fayrene Helper, MD  BREO ELLIPTA 100-25 MCG/INH AEPB 1 TAKE 1 INHALATION BY MOUTH ONCE DAILY Patient taking differently: Inhale 1 puff into the lungs in the morning.  08/25/19  Yes Fayrene Helper, MD  cholecalciferol (VITAMIN D3) 25 MCG (1000 UT) tablet Take 1,000 Units by mouth in the morning.    Yes [provider]  docusate sodium (COLACE) 100 MG capsule Take 300 mg by mouth at bedtime.    Yes [provider]  fluticasone (FLONASE) 50 MCG/ACT nasal spray SHAKE LIQUID AND USE 2 SPRAYS IN EACH NOSTRIL DAILY Patient taking differently: Place 2 sprays  into both nostrils every morning.  11/24/19  Yes Fayrene Helper, MD  folic acid (FOLVITE) 1 MG tablet TAKE 1 TABLET(1 MG) BY MOUTH DAILY Patient taking differently: Take 1 mg by mouth every evening.  11/10/19  Yes Fayrene Helper, MD  furosemide (LASIX) 40 MG tablet Take 1 tablet (40 mg total) by mouth daily. Patient taking differently: Take 40 mg by mouth every morning.  08/19/19  Yes Fayrene Helper, MD  gabapentin (NEURONTIN) 300 MG capsule Take 1 capsule (300 mg total) by mouth 2 (two) times daily. Patient taking differently: Take 300 mg by mouth at bedtime.  05/31/19  Yes Barton Dubois, MD  hydrALAZINE (APRESOLINE) 25 MG tablet Take 1 tablet (25 mg total) by mouth 2 (two) times daily. 05/19/19  Yes Croitoru, Mihai, MD  isosorbide mononitrate (IMDUR) 30 MG 24 hr tablet Take 1 tablet (30 mg total) by mouth daily. Patient taking differently: Take 15 mg by  mouth every morning.  12/29/19 03/28/20 Yes Croitoru, Mihai, MD  Liniments (SALONPAS PAIN RELIEF PATCH EX) Apply 1 patch topically daily as needed (pain).   Yes [provider]  meclizine (ANTIVERT) 12.5 MG tablet Take 1 tablet (12.5 mg total) by mouth 3 (three) times daily as needed for dizziness. 09/02/19  Yes Fayrene Helper, MD  metolazone (ZAROXOLYN) 2.5 MG tablet Take one tablet on Saturdays. Patient taking differently: Take 2.5 mg by mouth every Saturday.  11/24/19  Yes Croitoru, Mihai, MD  metoprolol succinate (TOPROL-XL) 100 MG 24 hr tablet TAKE 1 TABLET BY MOUTH EVERY DAY WITH FOOD OR MILK Patient taking differently: Take 100 mg by mouth every evening.  11/17/19  Yes Fayrene Helper, MD  Multiple Vitamin (MULTIVITAMIN) LIQD Take 5 mLs by mouth daily.   Yes [provider]  nitroGLYCERIN (NITROSTAT) 0.4 MG SL tablet Place 1 tablet (0.4 mg total) under the tongue every 5 (five) minutes as needed for chest pain. 08/03/19  Yes Rolland Porter, MD  ondansetron (ZOFRAN ODT) 4 MG disintegrating tablet Take 1 tablet (4 mg total) by mouth every 8 (eight) hours as needed for nausea or vomiting. 01/26/20  Yes Fawze, Mina A, PA-C  ondansetron (ZOFRAN) 4 MG tablet Take 4 mg by mouth every 8 (eight) hours as needed for nausea or vomiting.   Yes [provider]  Polyethyl [name] (SYSTANE) 0.4-0.3 % GEL ophthalmic gel Place 1 application into the left eye at bedtime.   Yes [provider]  polyethylene glycol powder (GLYCOLAX/MIRALAX) 17 GM/SCOOP powder Take 17 g by mouth daily as needed for mild constipation or moderate constipation.   Yes [provider]  potassium chloride SA (KLOR-CON) 20 MEQ tablet TAKE 1 TABLET(20 MEQ) BY MOUTH DAILY Patient taking differently: Take 20 mEq by mouth daily.  01/28/20  Yes Fayrene Helper, MD  pravastatin (PRAVACHOL) 40 MG tablet TAKE 1 TABLET BY MOUTH EVERY EVENING Patient taking differently: Take 40 mg by mouth  every evening.  11/10/19  Yes Fayrene Helper, MD  SYNTHROID 75 MCG tablet TAKE 1 TABLET BY MOUTH DAILY Patient taking differently: Take 75 mcg by mouth daily before breakfast.  10/27/19  Yes Fayrene Helper, MD  warfarin (JANTOVEN) 5 MG tablet TAKE 2 TABLETS EVERY DAY EXCEPT 1 TABLET ON SATURDAY Patient taking differently: Take 5-10 mg by mouth See admin instructions. TAKE 2 TABLETS EVERY DAY EXCEPT 1 TABLET ON Saturday @ 8:30PM 11/10/19  Yes Evans Lance, MD  famotidine (PEPCID) 20 MG tablet Take 1 tablet (20  mg total) by mouth 2 (two) times daily. Patient not taking: Reported on 01/31/2020 01/26/20   Debroah Baller                                                                                                                                    Past Surgical History Past Surgical History:  Procedure Laterality Date  . AORTIC AND MITRAL VALVE REPLACEMENT  2001  . BI-VENTRICULAR IMPLANTABLE CARDIOVERTER DEFIBRILLATOR UPGRADE N/A 01/18/2015   Procedure: BI-VENTRICULAR IMPLANTABLE CARDIOVERTER DEFIBRILLATOR UPGRADE;  Surgeon: Evans Lance, MD;  Location: [facility] CATH LAB;  Service: Cardiovascular;  Laterality: N/A;  . BIV ICD GENERATOR CHANGEOUT N/A 01/30/2019   Procedure: BIV ICD GENERATOR CHANGEOUT;  Surgeon: Evans Lance, MD;  Location: [facility] CV LAB;  Service: Cardiovascular;  Laterality: N/A;  . CARDIAC CATHETERIZATION    . CARDIAC VALVE REPLACEMENT    . CARDIOVERSION N/A 11/13/2016   Procedure: CARDIOVERSION;  Surgeon: Sanda Klein, MD;  Location: [facility] ENDOSCOPY;  Service: Cardiovascular;  Laterality: N/A;  . COLONOSCOPY  06/12/2007   Rourk- Normal rectum/Normal colon  . COLONOSCOPY  12/16/02   small hemorrhoids  . COLONOSCOPY  06/20/2012   Procedure: COLONOSCOPY;  Surgeon: Daneil Dolin, MD;  Location: [facility] ENDO SUITE;  Service: Endoscopy;  Laterality: N/A;  10:45  . defibrillator implanted 2006    . DOPPLER ECHOCARDIOGRAPHY  2012  . DOPPLER ECHOCARDIOGRAPHY   05,06,07,08,09,2011  . ESOPHAGOGASTRODUODENOSCOPY  06/12/2007   Rourk- normal esophagus, small hiatal hernia, otherwise normal stomach, D1, D2  . EYE SURGERY Right 2005  . EYE SURGERY Left 2006  . ICD LEAD REMOVAL Left 02/08/2015   Procedure: ICD LEAD REMOVAL;  Surgeon: Evans Lance, MD;  Location: [facility] OR;  Service: Cardiovascular;  Laterality: Left;  "Will plan extraction and insertion of a BiV PM"  **Dr. Roxan Hockey backing up case**  . IMPLANTABLE CARDIOVERTER DEFIBRILLATOR (ICD) GENERATOR CHANGE Left 02/08/2015   Procedure: ICD GENERATOR CHANGE;  Surgeon: Evans Lance, MD;  Location: [facility];  Service: Cardiovascular;  Laterality: Left;  . INSERT / REPLACE / REMOVE PACEMAKER    . PACEMAKER INSERTION  May 2016  . pacemaker placed  2004  . right breast cyst removed     benign   . right cataract removed     2005  . TEE WITHOUT CARDIOVERSION N/A 11/13/2016   Procedure: TRANSESOPHAGEAL ECHOCARDIOGRAM (TEE);  Surgeon: Sanda Klein, MD;  [facility] END[facility]COPY;  Service: Cardiovascular;  Laterality: N/A;  . THYROIDECTOMY     Family History Family History  Problem Relation Age of Onset  . Pancreatic cancer Mother   . Heart disease Father   . Heart disease Sister   . Heart attack Sister   . Heart disease Brother   . Diabetes Brother   . Heart disease Brother   . Diabetes Brother   . Hypertension Brother   . Lung cancer Brother  Social History Social History   Tobacco Use  . Smoking status: Former Smoker    Packs/day: 0.75    Years: 40.00    Pack years: 30.00    Types: Cigarettes    Quit date: 03/08/1987    Years since quitting: 32.9  . Smokeless tobacco: Never Used  Substance Use Topics  . Alcohol use: No    Alcohol/week: 0.0 standard drinks  . Drug use: No   Allergies Penicillins, Aspirin, Fentanyl, and Niacin  Review of Systems Review of Systems All other systems are reviewed and are negative for acute change except as noted in the HPI  Physical Exam Vital  Signs  I have reviewed the triage vital signs BP (!) 168/72 (BP Location: Left Arm)   Pulse 72   Temp 98.2 F (36.8 C) (Oral)   Resp (!) 34   SpO2 100%   Physical Exam Vitals reviewed.  Constitutional:      General: She is not in acute distress.    Appearance: She is well-developed. She is not diaphoretic.  HENT:     Head: Normocephalic and atraumatic.     Nose: Nose normal.  Eyes:     General: No scleral icterus.       Right eye: No discharge.        Left eye: No discharge.     Conjunctiva/sclera: Conjunctivae normal.     Pupils: Pupils are equal, round, and reactive to light.  Cardiovascular:     Rate and Rhythm: Normal rate and regular rhythm.     Heart sounds: No friction rub. No gallop.      Comments: mech valve murmur Pulmonary:     Effort: Pulmonary effort is normal. Tachypnea present. No respiratory distress.     Breath sounds: Normal breath sounds. Decreased air movement present. No stridor. No rales.  Abdominal:     General: There is no distension.     Palpations: Abdomen is soft.     Tenderness: There is no abdominal tenderness.  Musculoskeletal:        General: No tenderness.     Cervical back: Normal range of motion and neck supple.     Right lower leg: 2+ Pitting Edema present.     Left lower leg: 2+ Pitting Edema present.  Skin:    General: Skin is warm and dry.     Findings: No erythema or rash.  Neurological:     Mental Status: She is alert and oriented to person, place, and time.     ED Results and Treatments Labs (all labs ordered are listed, but only abnormal results are displayed) Labs Reviewed  BLOOD GAS, ARTERIAL - Abnormal; Notable for the following components:      Result Value   pO2, Arterial 61.4 (*)    Bicarbonate 28.7 (*)    Acid-Base Excess 3.6 (*)    All other components within normal limits  CBC WITH DIFFERENTIAL/PLATELET - Abnormal; Notable for the following components:   RBC 3.08 (*)    Hemoglobin 9.1 (*)    HCT 30.1 (*)      RDW 17.1 (*)    Lymphs Abs 0.6 (*)    All other components within normal limits  BRAIN NATRIURETIC PEPTIDE - Abnormal; Notable for the following components:   B Natriuretic Peptide 778.2 (*)    All other components within normal limits  COMPREHENSIVE METABOLIC PANEL - Abnormal; Notable for the following components:   Glucose, Bld 104 (*)    BUN 35 (*)  Creatinine, Ser 1.41 (*)    GFR calc non Af Amer 34 (*)    GFR calc Af Amer 39 (*)    All other components within normal limits  PROTIME-INR - Abnormal; Notable for the following components:   Prothrombin Time 24.2 (*)    INR 2.3 (*)    All other components within normal limits  I-STAT CHEM 8, ED - Abnormal; Notable for the following components:   Chloride 97 (*)    BUN 33 (*)    Creatinine, Ser 1.40 (*)    Glucose, Bld 101 (*)    Hemoglobin 10.2 (*)    HCT 30.0 (*)    All other components within normal limits  TROPONIN I (HIGH SENSITIVITY) - Abnormal; Notable for the following components:   Troponin I (High Sensitivity) 31 (*)    All other components within normal limits  TROPONIN I (HIGH SENSITIVITY) - Abnormal; Notable for the following components:   Troponin I (High Sensitivity) 31 (*)    All other components within normal limits  RESPIRATORY PANEL BY RT PCR (FLU A&B, COVID)                                                                                                                         EKG  EKG Interpretation  Date/Time:  Friday Jan 30 2020 23:38:03 EDT Ventricular Rate:  60 PR Interval:    QRS Duration: 186 QT Interval:  547 QTC Calculation: 547 R Axis:   114 Text Interpretation: Ventricular-paced rhythm No further analysis attempted due to paced rhythm Confirmed by Addison Lank [phone]) on 01/30/2020 11:51:40 PM      Radiology DG Chest Port 1 View  Result Date: 01/30/2020 CLINICAL DATA:  Patient presents with complaints of shortness of breath that have not gotten better since being seen at [facility] a few days  ago. Patient states worsening exertional SOB. Patient is on 2L [place] at home and her sats have been 95%+ on her O2. Patient states she has not had to increase O2 demand. Patient hx COPD. EXAM: PORTABLE CHEST 1 VIEW COMPARISON:  01/02/2020 FINDINGS: Stable changes from prior cardiac surgery and valve replacement. Cardiac silhouette is mildly enlarged. Enlarged pulmonary arteries. No mediastinal or hilar masses. Left anterior chest wall biventricular cardioverter-defibrillator is stable. Lungs demonstrate interstitial thickening similar to the prior study. No lung consolidation. No convincing pulmonary edema. No pleural effusion pneumothorax. Skeletal structures are demineralized but grossly intact. IMPRESSION: 1. No acute cardiopulmonary disease. 2. Cardiomegaly, changes from cardiac surgery and valve replacement, chronic interstitial thickening and prominent pulmonary arteries suggesting pulmonary hypertension, stable from the prior radiographs. Electronically Signed   By: Lajean Manes M.D.   On: 01/30/2020 23:48    Pertinent labs & imaging results that were available during my care of the patient were reviewed by me and considered in my medical decision making (see chart for details).  Medications Ordered in ED Medications  albuterol (VENTOLIN HFA) 108 (90 Base) MCG/ACT inhaler 6 puff (6  puffs Inhalation Given 01/30/20 2342)  ipratropium (ATROVENT HFA) inhaler 2 puff (2 puffs Inhalation Given 01/31/20 0045)  aerochamber Z-Stat Plus/medium 1 each (1 each Other Given 01/30/20 2343)  furosemide (LASIX) injection 60 mg (60 mg Intravenous Given 01/31/20 0202)                                                                                                                                    Procedures .Critical Care Performed by: Fatima Blank, MD Authorized by: Fatima Blank, MD     CRITICAL CARE Performed by: Grayce Sessions Eldred Sooy Total critical care time: 75 minutes Critical care time was  exclusive of separately billable procedures and treating other patients. Critical care was necessary to treat or prevent imminent or life-threatening deterioration. Critical care was time spent personally by me on the following activities: development of treatment plan with patient and/or surrogate as well as nursing, discussions with consultants, evaluation of patient's response to treatment, examination of patient, obtaining history from patient or surrogate, ordering and performing treatments and interventions, ordering and review of laboratory studies, ordering and review of radiographic studies, pulse oximetry and re-evaluation of patient's condition.   (including critical care time)  Medical Decision Making / ED Course I have reviewed the nursing notes for this encounter and the patient's prior records (if available in EHR or on provided paperwork).   Rashawn Rolon was evaluated in Emergency Department on 01/31/2020 for the symptoms described in the history of present illness. She was evaluated in the context of the global COVID-19 pandemic, which necessitated consideration that the patient might be at risk for infection with the SARS-CoV-2 virus that causes COVID-19. Institutional protocols and algorithms that pertain to the evaluation of patients at risk for COVID-19 are in a state of rapid change based on information released by regulatory bodies including the CDC and federal and state organizations. These policies and algorithms were followed during the patient's care in the ED.  Work-up is most consistent with CHF exacerbation with increase in her BNP from several days ago as well as her increasing peripheral edema.  Attempted to diurese patient in the emergency department.  She diuresed well putting out 1 L.  We attempted to ambulate the patient's she was still severely symptomatic and dropped her sats her to liters nasal cannula.  We will discuss admission with hospitalist team for  continued management.      Final Clinical Impression(s) / ED Diagnoses Final diagnoses:  SOB (shortness of breath)  Acute on chronic diastolic congestive heart failure ([place])      This chart was dictated using voice recognition software.  Despite best efforts to proofread,  errors can occur which can change the documentation meaning.   Fatima Blank, MD 01/31/20 [phone]

## 2020-01-31 ENCOUNTER — Inpatient Hospital Stay (HOSPITAL_COMMUNITY): Payer: Medicare Other

## 2020-01-31 ENCOUNTER — Encounter (HOSPITAL_COMMUNITY): Payer: Self-pay | Admitting: Internal Medicine

## 2020-01-31 DIAGNOSIS — Z95810 Presence of automatic (implantable) cardiac defibrillator: Secondary | ICD-10-CM | POA: Diagnosis not present

## 2020-01-31 DIAGNOSIS — Z952 Presence of prosthetic heart valve: Secondary | ICD-10-CM | POA: Diagnosis not present

## 2020-01-31 DIAGNOSIS — M109 Gout, unspecified: Secondary | ICD-10-CM | POA: Diagnosis present

## 2020-01-31 DIAGNOSIS — I484 Atypical atrial flutter: Secondary | ICD-10-CM | POA: Diagnosis present

## 2020-01-31 DIAGNOSIS — J42 Unspecified chronic bronchitis: Secondary | ICD-10-CM

## 2020-01-31 DIAGNOSIS — R109 Unspecified abdominal pain: Secondary | ICD-10-CM | POA: Diagnosis present

## 2020-01-31 DIAGNOSIS — N184 Chronic kidney disease, stage 4 (severe): Secondary | ICD-10-CM | POA: Diagnosis not present

## 2020-01-31 DIAGNOSIS — Z20822 Contact with and (suspected) exposure to covid-19: Secondary | ICD-10-CM | POA: Diagnosis present

## 2020-01-31 DIAGNOSIS — J9621 Acute and chronic respiratory failure with hypoxia: Secondary | ICD-10-CM | POA: Diagnosis present

## 2020-01-31 DIAGNOSIS — Z950 Presence of cardiac pacemaker: Secondary | ICD-10-CM | POA: Diagnosis not present

## 2020-01-31 DIAGNOSIS — I130 Hypertensive heart and chronic kidney disease with heart failure and stage 1 through stage 4 chronic kidney disease, or unspecified chronic kidney disease: Secondary | ICD-10-CM | POA: Diagnosis present

## 2020-01-31 DIAGNOSIS — I442 Atrioventricular block, complete: Secondary | ICD-10-CM | POA: Diagnosis not present

## 2020-01-31 DIAGNOSIS — I509 Heart failure, unspecified: Secondary | ICD-10-CM | POA: Diagnosis not present

## 2020-01-31 DIAGNOSIS — K219 Gastro-esophageal reflux disease without esophagitis: Secondary | ICD-10-CM | POA: Diagnosis not present

## 2020-01-31 DIAGNOSIS — E785 Hyperlipidemia, unspecified: Secondary | ICD-10-CM | POA: Diagnosis not present

## 2020-01-31 DIAGNOSIS — Z6829 Body mass index (BMI) 29.0-29.9, adult: Secondary | ICD-10-CM | POA: Diagnosis not present

## 2020-01-31 DIAGNOSIS — Z7901 Long term (current) use of anticoagulants: Secondary | ICD-10-CM | POA: Diagnosis not present

## 2020-01-31 DIAGNOSIS — E875 Hyperkalemia: Secondary | ICD-10-CM | POA: Diagnosis not present

## 2020-01-31 DIAGNOSIS — E890 Postprocedural hypothyroidism: Secondary | ICD-10-CM

## 2020-01-31 DIAGNOSIS — Z9981 Dependence on supplemental oxygen: Secondary | ICD-10-CM | POA: Diagnosis not present

## 2020-01-31 DIAGNOSIS — I5033 Acute on chronic diastolic (congestive) heart failure: Secondary | ICD-10-CM

## 2020-01-31 DIAGNOSIS — E663 Overweight: Secondary | ICD-10-CM | POA: Diagnosis present

## 2020-01-31 DIAGNOSIS — I4819 Other persistent atrial fibrillation: Secondary | ICD-10-CM | POA: Diagnosis not present

## 2020-01-31 DIAGNOSIS — I10 Essential (primary) hypertension: Secondary | ICD-10-CM | POA: Diagnosis not present

## 2020-01-31 DIAGNOSIS — N1832 Chronic kidney disease, stage 3b: Secondary | ICD-10-CM | POA: Diagnosis present

## 2020-01-31 DIAGNOSIS — M79672 Pain in left foot: Secondary | ICD-10-CM | POA: Diagnosis present

## 2020-01-31 DIAGNOSIS — I5043 Acute on chronic combined systolic (congestive) and diastolic (congestive) heart failure: Secondary | ICD-10-CM | POA: Diagnosis present

## 2020-01-31 DIAGNOSIS — I428 Other cardiomyopathies: Secondary | ICD-10-CM | POA: Diagnosis present

## 2020-01-31 DIAGNOSIS — J449 Chronic obstructive pulmonary disease, unspecified: Secondary | ICD-10-CM | POA: Diagnosis present

## 2020-01-31 DIAGNOSIS — Z87891 Personal history of nicotine dependence: Secondary | ICD-10-CM | POA: Diagnosis not present

## 2020-01-31 HISTORY — DX: Acute and chronic respiratory failure with hypoxia: J96.21

## 2020-01-31 LAB — RESPIRATORY PANEL BY RT PCR (FLU A&B, COVID)
Influenza A by PCR: NEGATIVE
Influenza B by PCR: NEGATIVE
SARS Coronavirus 2 by RT PCR: NEGATIVE

## 2020-01-31 LAB — CBC WITH DIFFERENTIAL/PLATELET
Abs Immature Granulocytes: 0.04 10*3/uL (ref 0.00–0.07)
Basophils Absolute: 0 10*3/uL (ref 0.0–0.1)
Basophils Relative: 0 %
Eosinophils Absolute: 0 10*3/uL (ref 0.0–0.5)
Eosinophils Relative: 0 %
HCT: 30.1 % — ABNORMAL LOW (ref 36.0–46.0)
Hemoglobin: 9.1 g/dL — ABNORMAL LOW (ref 12.0–15.0)
Immature Granulocytes: 1 %
Lymphocytes Relative: 8 %
Lymphs Abs: 0.6 10*3/uL — ABNORMAL LOW (ref 0.7–4.0)
MCH: 29.5 pg (ref 26.0–34.0)
MCHC: 30.2 g/dL (ref 30.0–36.0)
MCV: 97.7 fL (ref 80.0–100.0)
Monocytes Absolute: 0.6 10*3/uL (ref 0.1–1.0)
Monocytes Relative: 8 %
Neutro Abs: 5.7 10*3/uL (ref 1.7–7.7)
Neutrophils Relative %: 83 %
Platelets: 182 10*3/uL (ref 150–400)
RBC: 3.08 MIL/uL — ABNORMAL LOW (ref 3.87–5.11)
RDW: 17.1 % — ABNORMAL HIGH (ref 11.5–15.5)
WBC: 6.9 10*3/uL (ref 4.0–10.5)
nRBC: 0 % (ref 0.0–0.2)

## 2020-01-31 LAB — COMPREHENSIVE METABOLIC PANEL
ALT: 16 U/L (ref 0–44)
AST: 25 U/L (ref 15–41)
Albumin: 3.8 g/dL (ref 3.5–5.0)
Alkaline Phosphatase: 53 U/L (ref 38–126)
Anion gap: 9 (ref 5–15)
BUN: 35 mg/dL — ABNORMAL HIGH (ref 8–23)
CO2: 28 mmol/L (ref 22–32)
Calcium: 8.9 mg/dL (ref 8.9–10.3)
Chloride: 99 mmol/L (ref 98–111)
Creatinine, Ser: 1.41 mg/dL — ABNORMAL HIGH (ref 0.44–1.00)
GFR calc Af Amer: 39 mL/min — ABNORMAL LOW (ref >60)
GFR calc non Af Amer: 34 mL/min — ABNORMAL LOW (ref >60)
Glucose, Bld: 104 mg/dL — ABNORMAL HIGH (ref 70–99)
Potassium: 4.8 mmol/L (ref 3.5–5.1)
Sodium: 136 mmol/L (ref 135–145)
Total Bilirubin: 0.3 mg/dL (ref 0.3–1.2)
Total Protein: 7.7 g/dL (ref 6.5–8.1)

## 2020-01-31 LAB — PROTIME-INR
INR: 2.3 — ABNORMAL HIGH (ref 0.8–1.2)
Prothrombin Time: 24.2 seconds — ABNORMAL HIGH (ref 11.4–15.2)

## 2020-01-31 LAB — BLOOD GAS, ARTERIAL
Acid-Base Excess: 3.6 mmol/L — ABNORMAL HIGH (ref 0.0–2.0)
Bicarbonate: 28.7 mmol/L — ABNORMAL HIGH (ref 20.0–28.0)
FIO2: 28
O2 Saturation: 89.4 %
Patient temperature: 98.6
pCO2 arterial: 46.9 mmHg (ref 32.0–48.0)
pH, Arterial: 7.404 (ref 7.350–7.450)
pO2, Arterial: 61.4 mmHg — ABNORMAL LOW (ref 83.0–108.0)

## 2020-01-31 LAB — BRAIN NATRIURETIC PEPTIDE: B Natriuretic Peptide: 778.2 pg/mL — ABNORMAL HIGH (ref 0.0–100.0)

## 2020-01-31 LAB — ECHOCARDIOGRAM COMPLETE

## 2020-01-31 LAB — TROPONIN I (HIGH SENSITIVITY)
Troponin I (High Sensitivity): 31 ng/L — ABNORMAL HIGH (ref <18)
Troponin I (High Sensitivity): 31 ng/L — ABNORMAL HIGH (ref <18)

## 2020-01-31 MED ORDER — SODIUM CHLORIDE 0.9% FLUSH: 3.0000 mL | INTRAVENOUS | Status: DC | PRN

## 2020-01-31 MED ORDER — FLUTICASONE FUROATE-VILANTEROL 100-25 MCG/INH IN AEPB
1.0000 | INHALATION_SPRAY | Freq: Every morning | RESPIRATORY_TRACT | Status: DC
  Administered 2020-02-01 – 2020-02-03 (×3): 1 via RESPIRATORY_TRACT
  Filled 2020-01-31: qty 28

## 2020-01-31 MED ORDER — ONDANSETRON HCL 4 MG/2ML IJ SOLN: 4.0000 mg | Freq: Four times a day (QID) | INTRAMUSCULAR | Status: DC | PRN

## 2020-01-31 MED ORDER — VITAMIN D 25 MCG (1000 UNIT) PO TABS
1000.0000 [IU] | ORAL_TABLET | Freq: Every morning | ORAL | Status: DC
  Administered 2020-01-31 – 2020-02-03 (×4): 1000 [IU] via ORAL
  Filled 2020-01-31 (×4): qty 1

## 2020-01-31 MED ORDER — NITROGLYCERIN 0.4 MG SL SUBL: 0.4000 mg | SUBLINGUAL_TABLET | SUBLINGUAL | Status: DC | PRN

## 2020-01-31 MED ORDER — ACETAMINOPHEN 500 MG PO TABS
500.0000 mg | ORAL_TABLET | Freq: Four times a day (QID) | ORAL | Status: DC | PRN
  Administered 2020-01-31 – 2020-02-02 (×4): 500 mg via ORAL
  Filled 2020-01-31 (×5): qty 1

## 2020-01-31 MED ORDER — WARFARIN SODIUM 10 MG PO TABS
10.0000 mg | ORAL_TABLET | Freq: Once | ORAL | Status: AC
  Administered 2020-01-31: 10 mg via ORAL
  Filled 2020-01-31: qty 1

## 2020-01-31 MED ORDER — SALONPAS PAIN RELIEF PATCH EX PTCH: 1.0000 | MEDICATED_PATCH | Freq: Every day | CUTANEOUS | Status: DC | PRN

## 2020-01-31 MED ORDER — POLYETHYLENE GLYCOL 3350 17 G PO PACK
17.0000 g | PACK | Freq: Every day | ORAL | Status: DC | PRN
  Administered 2020-02-01: 17 g via ORAL
  Filled 2020-01-31: qty 1

## 2020-01-31 MED ORDER — LIDOCAINE 5 % EX PTCH
1.0000 | MEDICATED_PATCH | Freq: Every day | CUTANEOUS | Status: DC | PRN
  Administered 2020-01-31: 1 via TRANSDERMAL
  Filled 2020-01-31 (×2): qty 1

## 2020-01-31 MED ORDER — SODIUM CHLORIDE 0.9 % IV SOLN: 250.0000 mL | INTRAVENOUS | Status: DC | PRN

## 2020-01-31 MED ORDER — POLYETHYLENE GLYCOL 3350 17 GM/SCOOP PO POWD: 17.0000 g | Freq: Every day | ORAL | Status: DC | PRN

## 2020-01-31 MED ORDER — PRAVASTATIN SODIUM 40 MG PO TABS
40.0000 mg | ORAL_TABLET | Freq: Every evening | ORAL | Status: DC
  Administered 2020-01-31 – 2020-02-02 (×3): 40 mg via ORAL
  Filled 2020-01-31 (×3): qty 1

## 2020-01-31 MED ORDER — METOPROLOL SUCCINATE ER 100 MG PO TB24
100.0000 mg | ORAL_TABLET | Freq: Every evening | ORAL | Status: DC
  Administered 2020-01-31 – 2020-02-02 (×3): 100 mg via ORAL
  Filled 2020-01-31 (×4): qty 1

## 2020-01-31 MED ORDER — POTASSIUM CHLORIDE CRYS ER 20 MEQ PO TBCR
20.0000 meq | EXTENDED_RELEASE_TABLET | Freq: Every day | ORAL | Status: DC
  Administered 2020-01-31 – 2020-02-01 (×2): 20 meq via ORAL
  Filled 2020-01-31 (×2): qty 1

## 2020-01-31 MED ORDER — FLUTICASONE PROPIONATE 50 MCG/ACT NA SUSP
2.0000 | Freq: Every morning | NASAL | Status: DC
  Administered 2020-01-31 – 2020-02-03 (×4): 2 via NASAL
  Filled 2020-01-31: qty 16

## 2020-01-31 MED ORDER — FOLIC ACID 1 MG PO TABS
1.0000 mg | ORAL_TABLET | Freq: Every evening | ORAL | Status: DC
  Administered 2020-01-31 – 2020-02-02 (×3): 1 mg via ORAL
  Filled 2020-01-31 (×3): qty 1

## 2020-01-31 MED ORDER — HYDROCODONE-ACETAMINOPHEN 5-325 MG PO TABS
1.0000 | ORAL_TABLET | Freq: Two times a day (BID) | ORAL | Status: AC | PRN
  Administered 2020-01-31 – 2020-02-01 (×2): 1 via ORAL
  Filled 2020-01-31 (×2): qty 1

## 2020-01-31 MED ORDER — GABAPENTIN 300 MG PO CAPS
300.0000 mg | ORAL_CAPSULE | Freq: Every day | ORAL | Status: DC
  Administered 2020-01-31 – 2020-02-02 (×3): 300 mg via ORAL
  Filled 2020-01-31 (×3): qty 1

## 2020-01-31 MED ORDER — SODIUM CHLORIDE 0.9% FLUSH
3.0000 mL | Freq: Two times a day (BID) | INTRAVENOUS | Status: DC
  Administered 2020-01-31 – 2020-02-03 (×7): 3 mL via INTRAVENOUS

## 2020-01-31 MED ORDER — HYDRALAZINE HCL 25 MG PO TABS
25.0000 mg | ORAL_TABLET | Freq: Two times a day (BID) | ORAL | Status: DC
  Administered 2020-01-31 – 2020-02-03 (×7): 25 mg via ORAL
  Filled 2020-01-31 (×7): qty 1

## 2020-01-31 MED ORDER — WARFARIN - PHARMACIST DOSING INPATIENT: Freq: Every day | Status: DC

## 2020-01-31 MED ORDER — ISOSORBIDE MONONITRATE ER 30 MG PO TB24
15.0000 mg | ORAL_TABLET | Freq: Every morning | ORAL | Status: DC
  Administered 2020-01-31 – 2020-02-03 (×4): 15 mg via ORAL
  Filled 2020-01-31 (×4): qty 1

## 2020-01-31 MED ORDER — ALLOPURINOL 100 MG PO TABS
100.0000 mg | ORAL_TABLET | Freq: Every day | ORAL | Status: DC
  Administered 2020-01-31 – 2020-02-03 (×4): 100 mg via ORAL
  Filled 2020-01-31 (×4): qty 1

## 2020-01-31 MED ORDER — FUROSEMIDE 10 MG/ML IJ SOLN
60.0000 mg | Freq: Two times a day (BID) | INTRAMUSCULAR | Status: DC
  Administered 2020-01-31 – 2020-02-01 (×3): 60 mg via INTRAVENOUS
  Filled 2020-01-31: qty 6
  Filled 2020-01-31: qty 8
  Filled 2020-01-31: qty 6

## 2020-01-31 MED ORDER — DOCUSATE SODIUM 100 MG PO CAPS
300.0000 mg | ORAL_CAPSULE | Freq: Every day | ORAL | Status: DC
  Administered 2020-01-31 – 2020-02-02 (×3): 300 mg via ORAL
  Filled 2020-01-31 (×3): qty 3

## 2020-01-31 MED ORDER — METOLAZONE 2.5 MG PO TABS
2.5000 mg | ORAL_TABLET | ORAL | Status: DC
  Administered 2020-01-31: 2.5 mg via ORAL
  Filled 2020-01-31: qty 1

## 2020-01-31 MED ORDER — ADULT MULTIVITAMIN W/MINERALS CH
1.0000 | ORAL_TABLET | Freq: Every day | ORAL | Status: DC
  Administered 2020-01-31 – 2020-02-03 (×4): 1 via ORAL
  Filled 2020-01-31 (×4): qty 1

## 2020-01-31 MED ORDER — LEVOTHYROXINE SODIUM 50 MCG PO TABS
75.0000 ug | ORAL_TABLET | Freq: Every day | ORAL | Status: DC
  Administered 2020-02-01 – 2020-02-03 (×3): 75 ug via ORAL
  Filled 2020-01-31 (×3): qty 1

## 2020-01-31 MED ORDER — FUROSEMIDE 10 MG/ML IJ SOLN
60.0000 mg | Freq: Once | INTRAMUSCULAR | Status: AC
  Administered 2020-01-31: 60 mg via INTRAVENOUS
  Filled 2020-01-31: qty 8

## 2020-01-31 MED ORDER — ALBUTEROL SULFATE (2.5 MG/3ML) 0.083% IN NEBU
2.5000 mg | INHALATION_SOLUTION | Freq: Two times a day (BID) | RESPIRATORY_TRACT | Status: DC
  Administered 2020-01-31 – 2020-02-03 (×7): 2.5 mg via RESPIRATORY_TRACT
  Filled 2020-01-31 (×7): qty 3

## 2020-01-31 MED ORDER — ADULT MULTIVITAMIN LIQUID CH
5.0000 mL | Freq: Every day | ORAL | Status: DC
  Filled 2020-01-31: qty 15

## 2020-01-31 NOTE — ED Notes (Signed)
Attempted to ambulate patient-- patient's oxygen saturation maintained between 85-94% with 2 L of oxygen via nasal cannula. UOP: approximately 1 L

## 2020-01-31 NOTE — ED Notes (Signed)
ED TO INPATIENT HANDOFF REPORT  ED Nurse Name and Phone #: Estill Bamberg, RN [phone]  S Name/Age/Gender [name] [name] 86 y.o. female Room/Bed: WA25/WA25  Code Status   Code Status: Full Code  Home/SNF/Other Home Patient oriented to: self, place, time and situation Is this baseline? Yes   Triage Complete: Triage complete  Chief Complaint Acute exacerbation of CHF (congestive heart failure) ([place]) [I50.9]  Triage Note Patient presents with complaints of shortness of breath that have not gotten better since being seen at [facility] a few days ago. Patient states worsening exertional SOB. Patient is on 2L [place] at home and her sats have been 95%+ on her O2. Patient states she has not had to increase O2 demand. Patient hx COPD.     Allergies Allergies  Allergen Reactions  . Penicillins Other (See Comments)    Bruise Did it involve swelling of the face/tongue/throat, SOB, or low BP? No Did it involve sudden or severe rash/hives, skin peeling, or any reaction on the inside of your mouth or nose? No Did you need to seek medical attention at a hospital or doctor's office? No When did it last happen?20+ years If all above answers are "NO", may proceed with cephalosporin use.   . Aspirin Other (See Comments)    On coumadin  . Fentanyl Dermatitis    Rash with patch   . Niacin Itching    Level of Care/Admitting Diagnosis ED Disposition    ED Disposition Condition Comment   Admit  Hospital Area: [facility] [[id number]]  Level of Care: Telemetry [5]  Admit to tele based on following criteria: Acute CHF  May admit patient to [facility] or [facility] if equivalent level of care is available:: Yes  Covid Evaluation: Asymptomatic Screening Protocol (No Symptoms)  Diagnosis: Acute exacerbation of CHF (congestive heart failure) [facility]) [[id number]]  Admitting Physician: Shela Leff [[id number]]  Attending Physician: Shela Leff [[id number]]  Estimated length of stay: past  midnight tomorrow  Certification:: I certify this patient will need inpatient services for at least 2 midnights       B Medical/Surgery History Past Medical History:  Diagnosis Date  . Acute on chronic diastolic CHF (congestive heart failure) ([place]) 05/19/2012  . Adenomatous polyp of colon 12/16/2002   Dr. [name]/[facility]  . Allergy   . Calculus of gallbladder with chronic cholecystitis without obstruction 02/09/2009   Qualifier: Diagnosis of  By: [name], [name]    . Cataract   . CHB (complete heart block) ([place]) 10/07/2014      . CHF (congestive heart failure) ([place])    a.  EF previously reduced to 25-30% b. EF improved to 60-65% by echo in 10/2017.   Marland Kitchen Chronic kidney disease, stage I 2006  . Constipation       . COPD (chronic obstructive pulmonary disease) ([place])       . DJD (degenerative joint disease) of lumbar spine   . DM (diabetes mellitus) ([place]) 2006  . Esophageal reflux   . Glaucoma   . Gout       . H/O adenomatous polyp of colon 05/24/2012   2004 in [place] colonoscopy 2008 normal    . H/O heart valve replacement with mechanical valve    a. s/p mechanical AVR in 2001 and mechanical MVR in 2004.  Marland Kitchen H/O wisdom tooth extraction   . HIP PAIN, RIGHT 04/23/2009   Qualifier: Diagnosis of  By: [name], [name]    . Hyperlipemia   .  IDA (iron deficiency anemia)    Parenteral iron/[name]  . Insomnia       . Nausea without vomiting 06/07/2010   Qualifier: Diagnosis of  By: [name], [name]    . Non-ischemic cardiomyopathy ([place])    a. s/p MDT CRTD  . Obesity, unspecified   . Other and unspecified hyperlipidemia   . Oxygen deficiency 2014   nocturnal;  . Papilloma of breast 03/06/2012   This was diagnosed as a left breast papilloma by ultrasound characteristics and symptoms in 2010. Because of possible medical risk factors no surgical intervention has been done and it did doing annual followups. The area has been  stable by physical examination and mammograms and ultrasound since 2010.   Marland Kitchen Spinal stenosis of lumbar region 02/19/2013  . Spondylolisthesis   . Spondylosis   . Thyroid cancer ([place])    remote thyroidectomy, no recurrence, pt denies in 2016 thyroid cancer  . Type 2 diabetes mellitus with hyperglycemia ([place]) 07/15/2010  . Unspecified hypothyroidism       . 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 Vertigo        Past Surgical History:  Procedure Laterality Date  . AORTIC AND MITRAL VALVE REPLACEMENT  2001  . BI-VENTRICULAR IMPLANTABLE CARDIOVERTER DEFIBRILLATOR UPGRADE N/A 01/18/2015   Procedure: BI-VENTRICULAR IMPLANTABLE CARDIOVERTER DEFIBRILLATOR UPGRADE;  Surgeon: Evans Lance, MD;  Location: [facility] C[facility] LAB;  Service: Cardiovascular;  Laterality: N/A;  . BIV ICD GENERATOR CHANGEOUT N/A 01/30/2019   Procedure: BIV ICD GENERATOR CHANGEOUT;  Surgeon: Evans Lance, MD;  Location: [facility] CV LAB;  Service: Cardiovascular;  Laterality: N/A;  . CARDIAC CATHETERIZATION    . CARDIAC VALVE REPLACEMENT    . CARDIOVERSION N/A 11/13/2016   Procedure: CARDIOVERSION;  Surgeon: Sanda Klein, MD;  Location: [facility] ENDOSCOPY;  Service: Cardiovascular;  Laterality: N/A;  . COLONOSCOPY  06/12/2007   Rourk- Normal rectum/Normal colon  . COLONOSCOPY  12/16/02   small hemorrhoids  . COLONOSCOPY  06/20/2012   Procedure: COLONOSCOPY;  Surgeon: Daneil Dolin, MD;  [facility] ENDO [facility];  Service: Endoscopy;  Laterality: N/A;  10:45  . defibrillator implanted 2006    . DOPPLER ECHOCARDIOGRAPHY  2012  . DOPPLER ECHOCARDIOGRAPHY  05,06,07,08,09,2011  . ESOPHAGOGASTRODUODENOSCOPY  06/12/2007   Rourk- normal esophagus, small hiatal hernia, otherwise normal stomach, D1, D2  . EYE SURGERY Right 2005  . EYE SURGERY Left 2006  . ICD LEAD REMOVAL Left 02/08/2015   Procedure: ICD LEAD REMOVAL;  Surgeon: Evans Lance, MD;  Location: [facility] OR;  Service: Cardiovascular;  Laterality: Left;   "Will plan extraction and insertion of a BiV PM"  **Dr. Roxan Hockey backing up case**  . IMPLANTABLE CARDIOVERTER DEFIBRILLATOR (ICD) GENERATOR CHANGE Left 02/08/2015   Procedure: ICD GENERATOR CHANGE;  Surgeon: Evans Lance, MD;  Location: [facility];  Service: Cardiovascular;  Laterality: Left;  . INSERT / REPLACE / REMOVE PACEMAKER    . PACEMAKER INSERTION  May 2016  . pacemaker placed  2004  . right breast cyst removed     benign   . right cataract removed     2005  . TEE WITHOUT CARDIOVERSION N/A 11/13/2016   Procedure: TRANSESOPHAGEAL ECHOCARDIOGRAM (TEE);  Surgeon: Sanda Klein, MD;  Location: [facility] ENDOSCOPY;  Service: Cardiovascular;  Laterality: N/A;  . THYROIDECTOMY       A IV Location/Drains/Wounds Patient Lines/Drains/Airways Status   Active Line/Drains/Airways    None          Intake/Output  Last 24 hours  Intake/Output Summary (Last 24 hours) at 01/31/2020 1459 Last data filed at 01/31/2020 1309 Gross per 24 hour  Intake --  Output 3000 ml  Net -3000 ml    Labs/Imaging Results for orders placed or performed during the hospital encounter of 01/30/20 (from the past 48 hour(s))  Blood gas, arterial     Status: Abnormal   Collection Time: 01/30/20 11:40 PM  Result Value Ref Range   FIO2 28.00    pH, Arterial 7.404 7.350 - 7.450   pCO2 arterial 46.9 32.0 - 48.0 mmHg   pO2, Arterial 61.4 (L) 83.0 - 108.0 mmHg   Bicarbonate 28.7 (H) 20.0 - 28.0 mmol/L   Acid-Base Excess 3.6 (H) 0.0 - 2.0 mmol/L   O2 Saturation 89.4 %   Patient temperature 98.6    Allens test (pass/fail) PASS PASS    Comment: Performed at [facility], [place] [address], [place], [place] [address]  CBC with Differential/Platelet     Status: Abnormal   Collection Time: 01/30/20 11:40 PM  Result Value Ref Range   WBC 6.9 4.0 - 10.5 K/uL   RBC 3.08 (L) 3.87 - 5.11 MIL/uL   Hemoglobin 9.1 (L) 12.0 - 15.0 g/dL   HCT 30.1 (L) 36.0 - 46.0 %   MCV 97.7 80.0 - 100.0 fL   MCH 29.5 26.0 -  34.0 pg   MCHC 30.2 30.0 - 36.0 g/dL   RDW 17.1 (H) 11.5 - 15.5 %   Platelets 182 150 - 400 K/uL   nRBC 0.0 0.0 - 0.2 %   Neutrophils Relative % 83 %   Neutro Abs 5.7 1.7 - 7.7 K/uL   Lymphocytes Relative 8 %   Lymphs Abs 0.6 (L) 0.7 - 4.0 K/uL   Monocytes Relative 8 %   Monocytes Absolute 0.6 0.1 - 1.0 K/uL   Eosinophils Relative 0 %   Eosinophils Absolute 0.0 0.0 - 0.5 K/uL   Basophils Relative 0 %   Basophils Absolute 0.0 0.0 - 0.1 K/uL   Immature Granulocytes 1 %   Abs Immature Granulocytes 0.04 0.00 - 0.07 K/uL    Comment: Performed at [facility], [place] [address], [place], [place] [address]  Brain natriuretic peptide     Status: Abnormal   Collection Time: 01/30/20 11:40 PM  Result Value Ref Range   B Natriuretic Peptide 778.2 (H) 0.0 - 100.0 pg/mL    Comment: Performed at [facility], [place] [address], [place], [place] [address]  Comprehensive metabolic panel     Status: Abnormal   Collection Time: 01/30/20 11:40 PM  Result Value Ref Range   Sodium 136 135 - 145 mmol/L   Potassium 4.8 3.5 - 5.1 mmol/L   Chloride 99 98 - 111 mmol/L   CO2 28 22 - 32 mmol/L   Glucose, Bld 104 (H) 70 - 99 mg/dL    Comment: Glucose reference range applies only to samples taken after fasting for at least 8 hours.   BUN 35 (H) 8 - 23 mg/dL   Creatinine, Ser 1.41 (H) 0.44 - 1.00 mg/dL   Calcium 8.9 8.9 - 10.3 mg/dL   Total Protein 7.7 6.5 - 8.1 g/dL   Albumin 3.8 3.5 - 5.0 g/dL   AST 25 15 - 41 U/L   ALT 16 0 - 44 U/L   Alkaline Phosphatase 53 38 - 126 U/L   Total Bilirubin 0.3 0.3 - 1.2 mg/dL   GFR calc non Af Amer 34 (L) >  60 mL/min   GFR calc Af Amer 39 (L) >60 mL/min   Anion gap 9 5 - 15    Comment: Performed at [facility], [place] [address] [place], [place] [address]  Troponin I (High Sensitivity)     Status: Abnormal   Collection Time: 01/30/20 11:40 PM  Result Value Ref Range   Troponin I (High Sensitivity) 31 (H) <18 ng/L     Comment: (NOTE) Elevated high sensitivity troponin I (hsTnI) values and significant  changes across serial measurements may suggest ACS but many other  chronic and acute conditions are known to elevate hsTnI results.  Refer to the "Links" section for chest pain algorithms and additional  guidance. Performed at [facility], [place] [address] [place], [place] [address]   Protime-INR     Status: Abnormal   Collection Time: 01/30/20 11:40 PM  Result Value Ref Range   Prothrombin Time 24.2 (H) 11.4 - 15.2 seconds   INR 2.3 (H) 0.8 - 1.2    Comment: (NOTE) INR goal varies based on device and disease states. Performed at [facility], [place] [address], [place], [place] [address]   I-stat chem 8, ED (not at [facility] or [facility])     Status: Abnormal   Collection Time: 01/30/20 11:56 PM  Result Value Ref Range   Sodium 136 135 - 145 mmol/L   Potassium 4.7 3.5 - 5.1 mmol/L   Chloride 97 (L) 98 - 111 mmol/L   BUN 33 (H) 8 - 23 mg/dL   Creatinine, Ser 1.40 (H) 0.44 - 1.00 mg/dL   Glucose, Bld 101 (H) 70 - 99 mg/dL    Comment: Glucose reference range applies only to samples taken after fasting for at least 8 hours.   Calcium, Ion 1.19 1.15 - 1.40 mmol/L   TCO2 29 22 - 32 mmol/L   Hemoglobin 10.2 (L) 12.0 - 15.0 g/dL   HCT 30.0 (L) 36.0 - 46.0 %  Troponin I (High Sensitivity)     Status: Abnormal   Collection Time: 01/31/20  1:32 AM  Result Value Ref Range   Troponin I (High Sensitivity) 31 (H) <18 ng/L    Comment: (NOTE) Elevated high sensitivity troponin I (hsTnI) values and significant  changes across serial measurements may suggest ACS but many other  chronic and acute conditions are known to elevate hsTnI results.  Refer to the "Links" section for chest pain algorithms and additional  guidance. Performed at [facility], [place] [address] [place], [place] [address]   Respiratory Panel by RT PCR (Flu A&B, Covid) - Nasopharyngeal Swab      Status: None   Collection Time: 01/31/20  5:09 AM   Specimen: Nasopharyngeal Swab  Result Value Ref Range   SARS Coronavirus 2 by RT PCR NEGATIVE NEGATIVE    Comment: (NOTE) SARS-CoV-2 target nucleic acids are NOT DETECTED. The SARS-CoV-2 RNA is generally detectable in upper respiratoy specimens during the acute phase of infection. The lowest concentration of SARS-CoV-2 viral copies this assay can detect is 131 copies/mL. A negative result does not preclude SARS-Cov-2 infection and should not be used as the sole basis for treatment or other patient management decisions. A negative result may occur with  improper specimen collection/handling, submission of specimen other than nasopharyngeal swab, presence of viral mutation(s) within the areas targeted by this assay, and inadequate number of viral copies (<131 copies/mL). A negative result must be combined with clinical observations, patient history, and epidemiological information. The expected  result is Negative. Fact Sheet for Patients:  PinkCheek.be Fact Sheet for Healthcare Providers:  GravelBags.it This test is not yet ap proved or cleared by the [place] FDA and  has been authorized for detection and/or diagnosis of SARS-CoV-2 by FDA under an Emergency Use Authorization (EUA). This EUA will remain  in effect (meaning this test can be used) for the duration of the COVID-19 declaration under Section 564(b)(1) of the Act, 21 U.S.C. section 360bbb-3(b)(1), unless the authorization is terminated or revoked sooner.    Influenza A by PCR NEGATIVE NEGATIVE   Influenza B by PCR NEGATIVE NEGATIVE    Comment: (NOTE) The Xpert Xpress SARS-CoV-2/FLU/RSV assay is intended as an aid in  the diagnosis of influenza from Nasopharyngeal swab specimens and  should not be used as a sole basis for treatment. Nasal washings and  aspirates are unacceptable for Xpert Xpress  SARS-CoV-2/FLU/RSV  testing. Fact Sheet for Patients: PinkCheek.be Fact Sheet for Healthcare Providers: GravelBags.it This test is not yet approved or cleared by the [place] FDA and  has been authorized for detection and/or diagnosis of SARS-CoV-2 by  FDA under an Emergency Use Authorization (EUA). This EUA will remain  in effect (meaning this test can be used) for the duration of the  Covid-19 declaration under Section 564(b)(1) of the Act, 21  U.S.C. section 360bbb-3(b)(1), unless the authorization is  terminated or revoked. Performed at [facility], [place] [address] [place], [place] [address]    *Note: Due to a large number of results and/or encounters for the requested time period, some results have not been displayed. A complete set of results can be found in Results Review.   DG Chest Port 1 View  Result Date: 01/30/2020 CLINICAL DATA:  Patient presents with complaints of shortness of breath that have not gotten better since being seen at [facility] a few days ago. Patient states worsening exertional SOB. Patient is on 2L [place] at home and her sats have been 95%+ on her O2. Patient states she has not had to increase O2 demand. Patient hx COPD. EXAM: PORTABLE CHEST 1 VIEW COMPARISON:  01/02/2020 FINDINGS: Stable changes from prior cardiac surgery and valve replacement. Cardiac silhouette is mildly enlarged. Enlarged pulmonary arteries. No mediastinal or hilar masses. Left anterior chest wall biventricular cardioverter-defibrillator is stable. Lungs demonstrate interstitial thickening similar to the prior study. No lung consolidation. No convincing pulmonary edema. No pleural effusion pneumothorax. Skeletal structures are demineralized but grossly intact. IMPRESSION: 1. No acute cardiopulmonary disease. 2. Cardiomegaly, changes from cardiac surgery and valve replacement, chronic interstitial thickening and prominent  pulmonary arteries suggesting pulmonary hypertension, stable from the prior radiographs. Electronically Signed   By: Lajean Manes M.D.   On: 01/30/2020 23:48    Pending Labs Unresulted Labs (From admission, onward)    Start     Ordered   02/01/20 1660  Basic metabolic panel  Daily,   R     01/31/20 1138   02/01/20 0500  CBC WITH DIFFERENTIAL  Tomorrow morning,   R     01/31/20 1138   02/01/20 0500  Protime-INR  Daily,   R     01/31/20 0859          Vitals/Pain Today's Vitals   01/31/20 1400 01/31/20 1430 01/31/20 1443 01/31/20 1457  BP: (!) 171/66 (!) 176/65    Pulse: (!) 59 60    Resp: (!) 24 (!) 27    Temp:      TempSrc:  SpO2: 93% 94%    PainSc:   10-Worst pain ever 10-Worst pain ever    Isolation Precautions No active isolations  Medications Medications  furosemide (LASIX) injection 60 mg (60 mg Intravenous Given 01/31/20 1200)  acetaminophen (TYLENOL) tablet 500 mg (500 mg Oral Given 01/31/20 1314)  allopurinol (ZYLOPRIM) tablet 100 mg (100 mg Oral Given 01/31/20 1159)  hydrALAZINE (APRESOLINE) tablet 25 mg (25 mg Oral Given 01/31/20 1204)  isosorbide mononitrate (IMDUR) 24 hr tablet 15 mg (15 mg Oral Given 01/31/20 1200)  metolazone (ZAROXOLYN) tablet 2.5 mg (2.5 mg Oral Given 01/31/20 1159)  metoprolol succinate (TOPROL-XL) 24 hr tablet 100 mg (has no administration in time range)  nitroGLYCERIN (NITROSTAT) SL tablet 0.4 mg (has no administration in time range)  pravastatin (PRAVACHOL) tablet 40 mg (has no administration in time range)  levothyroxine (SYNTHROID) tablet 75 mcg (has no administration in time range)  docusate sodium (COLACE) capsule 300 mg (has no administration in time range)  folic acid (FOLVITE) tablet 1 mg (has no administration in time range)  gabapentin (NEURONTIN) capsule 300 mg (has no administration in time range)  cholecalciferol (VITAMIN D3) tablet 1,000 Units (1,000 Units Oral Given 01/31/20 1159)  potassium chloride SA (KLOR-CON) CR tablet 20  mEq (20 mEq Oral Given 01/31/20 1159)  albuterol (PROVENTIL) (2.5 MG/3ML) 0.083% nebulizer solution 2.5 mg (2.5 mg Nebulization Given 01/31/20 1210)  fluticasone furoate-vilanterol (BREO ELLIPTA) 100-25 MCG/INH 1 puff (has no administration in time range)  fluticasone (FLONASE) 50 MCG/ACT nasal spray 2 spray (has no administration in time range)  sodium chloride flush (NS) 0.9 % injection 3 mL (3 mLs Intravenous Given 01/31/20 1212)  sodium chloride flush (NS) 0.9 % injection 3 mL (has no administration in time range)  0.9 %  sodium chloride infusion (has no administration in time range)  ondansetron (ZOFRAN) injection 4 mg (has no administration in time range)  warfarin (COUMADIN) tablet 10 mg (has no administration in time range)  Warfarin - Pharmacist Dosing Inpatient (has no administration in time range)  polyethylene glycol (MIRALAX / GLYCOLAX) packet 17 g (has no administration in time range)  multivitamin with minerals tablet 1 tablet (1 tablet Oral Given 01/31/20 1159)  albuterol (VENTOLIN HFA) 108 (90 Base) MCG/ACT inhaler 6 puff (6 puffs Inhalation Given 01/30/20 2342)  ipratropium (ATROVENT HFA) inhaler 2 puff (2 puffs Inhalation Given 01/31/20 0045)  aerochamber Z-Stat Plus/medium 1 each (1 each Other Given 01/30/20 2343)  furosemide (LASIX) injection 60 mg (60 mg Intravenous Given 01/31/20 0202)    Mobility walks with device High fall risk  R Recommendations: See Admitting Provider Note  Report given to: Musician 4E  Additional Notes:

## 2020-01-31 NOTE — Progress Notes (Signed)
  Echocardiogram 2D Echocardiogram has been performed.  Bobbye Charleston 01/31/2020, 3:17 PM

## 2020-01-31 NOTE — ED Notes (Signed)
Bed linen/ gown changed and brief applied. Patient reconnected to [facility] device.

## 2020-01-31 NOTE — ED Notes (Signed)
Asked Charge to call REPORT for me had a covid + patient work up come in. Charge was busy.

## 2020-01-31 NOTE — Plan of Care (Signed)

## 2020-01-31 NOTE — Progress Notes (Signed)
ANTICOAGULATION CONSULT NOTE - Initial Consult  Pharmacy Consult for warfarin  Indication: Mechanical mitral and aortic valve  Allergies  Allergen Reactions  . Penicillins Other (See Comments)    Bruise Did it involve swelling of the face/tongue/throat, SOB, or low BP? No Did it involve sudden or severe rash/hives, skin peeling, or any reaction on the inside of your mouth or nose? No Did you need to seek medical attention at a hospital or doctor's office? No When did it last happen?20+ years If all above answers are "NO", may proceed with cephalosporin use.   . Aspirin Other (See Comments)    On coumadin  . Fentanyl Dermatitis    Rash with patch   . Niacin Itching    Patient Measurements:   Heparin Dosing Weight:   Vital Signs: Temp: 98.2 F (36.8 C) (05/07 2303) Temp Source: Oral (05/07 2303) BP: 145/61 (05/08 0830) Pulse Rate: 58 (05/08 0830)  Labs: Recent Labs    01/30/20 2340 01/30/20 2356 01/31/20 0132  HGB 9.1* 10.2*  --   HCT 30.1* 30.0*  --   PLT 182  --   --   LABPROT 24.2*  --   --   INR 2.3*  --   --   CREATININE 1.41* 1.40*  --   TROPONINIHS 31*  --  31*    Estimated Creatinine Clearance: 33.4 mL/min (A) (by C-G formula based on SCr of 1.4 mg/dL (H)).   Medical History: Past Medical History:  Diagnosis Date  . Acute on chronic diastolic CHF (congestive heart failure) ([place]) 05/19/2012  . Adenomatous polyp of colon 12/16/2002   Dr. [name]/[facility]  . Allergy   . Calculus of gallbladder with chronic cholecystitis without obstruction 02/09/2009   Qualifier: Diagnosis of  By: [name], [name]    . Cataract   . CHB (complete heart block) ([place]) 10/07/2014      . CHF (congestive heart failure) ([place])    a.  EF previously reduced to 25-30% b. EF improved to 60-65% by echo in 10/2017.   Marland Kitchen Chronic kidney disease, stage I 2006  . Constipation       . COPD (chronic obstructive pulmonary disease) ([place])        . DJD (degenerative joint disease) of lumbar spine   . DM (diabetes mellitus) ([place]) 2006  . Esophageal reflux   . Glaucoma   . Gout       . H/O adenomatous polyp of colon 05/24/2012   2004 in [place] colonoscopy 2008 normal    . H/O heart valve replacement with mechanical valve    a. s/p mechanical AVR in 2001 and mechanical MVR in 2004.  Marland Kitchen H/O wisdom tooth extraction   . HIP PAIN, RIGHT 04/23/2009   Qualifier: Diagnosis of  By: [name], [name]    . Hyperlipemia   . IDA (iron deficiency anemia)    Parenteral iron/[name]  . Insomnia       . Nausea without vomiting 06/07/2010   Qualifier: Diagnosis of  By: [name], [name]    . Non-ischemic cardiomyopathy ([place])    a. s/p MDT CRTD  . Obesity, unspecified   . Other and unspecified hyperlipidemia   . Oxygen deficiency 2014   nocturnal;  . Papilloma of breast 03/06/2012   This was diagnosed as a left breast papilloma by ultrasound characteristics and symptoms in 2010. Because of possible medical risk factors no surgical intervention has been done and it did doing annual  followups. The area has been stable by physical examination and mammograms and ultrasound since 2010.   Marland Kitchen Spinal stenosis of lumbar region 02/19/2013  . Spondylolisthesis   . Spondylosis   . Thyroid cancer ([place])    remote thyroidectomy, no recurrence, pt denies in 2016 thyroid cancer  . Type 2 diabetes mellitus with hyperglycemia ([place]) 07/15/2010  . Unspecified hypothyroidism       . 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 Vertigo         Medications:  Scheduled:   Assessment: Pharmacy is consulted to dose warfarin in 86 yo female with PMH of Mechanical mitral and aortic valve replacement.   Per med rec, pt PTA regimen is warfarin 10 mg PO daily except 5 mg on saturdays. This is confirmed by the most recent visit pt had to anticoagulation clinic on 12/31/2019 as available in Chart Review. At this visit, states that the  5 mg dose is on Tuesdays, but either maintains same total weekly dose. In this same note, it is noted that the target INR for this pt is 2.0 to 2.5 per 04/30/17 INR goal decreased per Dt Taylor's order. Target INR was decreased due to large bruising.   Today, 01/31/20  INR is 2.3, therapeutic per modified target range   Hgb 10.2, plt 182, Hgb low but stable    Goal of Therapy:  INR 2.0-2.5 Monitor platelets by anticoagulation protocol: Yes   Plan:   Warfarin 10 mg PO x 1   Daily INR   Monitor oral intake  Monitor for start of any new interacting medication  Monitor for signs and symptoms of bleeding   Royetta Asal, PharmD, BCPS 01/31/2020 8:59 AM

## 2020-01-31 NOTE — H&P (Addendum)
History and Physical    Erandy Mceachern VOZ:[id number] DOB: [birth year] DOA: 01/30/2020  PCP: Fayrene Helper, MD   I have briefly reviewed patients previous medical reports in [facility].  Patient coming from: Home  Chief Complaint: Progressive dyspnea, leg swelling and weight gain.  HPI: [name] [name] is a 86 year old female, lives with her nephew, ambulates with the help of a cane or a walker, PMH of chronic diastolic CHF, atypical atrial flutter/A. fib, complete heart block-pacemaker dependent, CRT-P, s/p mechanical mitral and aortic valve replacement on Coumadin anticoagulation, prosthetic valve associated hemolysis, essential hypertension, CKD stage IIIb, COPD, chronic hypoxic respiratory failure on home oxygen 2 L/min continuously, GERD and gout, presented to the [facility] ED on 01/30/2020 due to progressive dyspnea, leg swelling and weight gain.  Patient reports that for the past several days, she has noted gradually worsening dyspnea, both at rest and on mild exertion, associated with increased swelling of both her legs some of which she attributes to sitting in recliner with her legs hanging down, reportedly drained approximately 10 pounds weight over the last several weeks.  Due to weakness, she states that her diuretics had been cut down about 2 weeks ago.  She also volunteers to dietary indiscretion/increased salt intake from food provided by her friends/family.  Compliant with diuretics and claims good urine output.  Denies orthopnea, chest pain, palpitations, dizziness or lightheadedness.  No cough.  Completed COVID-19 vaccination.  Utilized her home inhalers with minimal improvement.  Thereby came to the ED.  Since ED, received Lasix 60 mg IV x1 with good urine output and breathing feels better but not yet at baseline.  ED Course: Afebrile, tachypneic as high as in the 30s, currently in the mid to high 20s, normal heart rate, elevated blood pressures up to systolic in  the 425Z.  Hypoxic with oxygen saturation in the low 90s on 2 L/min oxygen via nasal cannula.  ABG: pH 7.404, PCO2 47, PO2 61, HCO3 28.7 and oxygen saturation 89.4.  CMP significant for BUN 35, creatinine 1.41.  BNP 778.  Troponin plateaued in the low 30s.  Hemoglobin 9.1.  INR 2.3.  Flu panel and Covid 19 RT-PCR negative.  Chest x-ray personally reviewed, suggestive of pulmonary edema, surgical clips, cardiomegaly, pacemaker.  Review of Systems:  All other systems reviewed and apart from HPI, are negative.  Past Medical History:  Diagnosis Date  . Acute on chronic diastolic CHF (congestive heart failure) ([place]) 05/19/2012  . Adenomatous polyp of colon 12/16/2002   Dr. [name]/[facility]  . Allergy   . Calculus of gallbladder with chronic cholecystitis without obstruction 02/09/2009   Qualifier: Diagnosis of  By: [name], [name]    . Cataract   . CHB (complete heart block) ([place]) 10/07/2014      . CHF (congestive heart failure) ([place])    a.  EF previously reduced to 25-30% b. EF improved to 60-65% by echo in 10/2017.   Marland Kitchen Chronic kidney disease, stage I 2006  . Constipation       . COPD (chronic obstructive pulmonary disease) ([place])       . DJD (degenerative joint disease) of lumbar spine   . DM (diabetes mellitus) ([place]) 2006  . Esophageal reflux   . Glaucoma   . Gout       . H/O adenomatous polyp of colon 05/24/2012   2004 in [place] colonoscopy 2008 normal    . H/O heart valve replacement with mechanical  valve    a. s/p mechanical AVR in 2001 and mechanical MVR in 2004.  Marland Kitchen H/O wisdom tooth extraction   . HIP PAIN, RIGHT 04/23/2009   Qualifier: Diagnosis of  By: [name], [name]    . Hyperlipemia   . IDA (iron deficiency anemia)    Parenteral iron/[name]  . Insomnia       . Nausea without vomiting 06/07/2010   Qualifier: Diagnosis of  By: [name], [name]    . Non-ischemic cardiomyopathy ([place])    a. s/p MDT CRTD  . Obesity,  unspecified   . Other and unspecified hyperlipidemia   . Oxygen deficiency 2014   nocturnal;  . Papilloma of breast 03/06/2012   This was diagnosed as a left breast papilloma by ultrasound characteristics and symptoms in 2010. Because of possible medical risk factors no surgical intervention has been done and it did doing annual followups. The area has been stable by physical examination and mammograms and ultrasound since 2010.   Marland Kitchen Spinal stenosis of lumbar region 02/19/2013  . Spondylolisthesis   . Spondylosis   . Thyroid cancer ([place])    remote thyroidectomy, no recurrence, pt denies in 2016 thyroid cancer  . Type 2 diabetes mellitus with hyperglycemia ([place]) 07/15/2010  . Unspecified hypothyroidism       . 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 Vertigo         Past Surgical History:  Procedure Laterality Date  . AORTIC AND MITRAL VALVE REPLACEMENT  2001  . BI-VENTRICULAR IMPLANTABLE CARDIOVERTER DEFIBRILLATOR UPGRADE N/A 01/18/2015   Procedure: BI-VENTRICULAR IMPLANTABLE CARDIOVERTER DEFIBRILLATOR UPGRADE;  Surgeon: Evans Lance, MD;  [facility] CATH LAB;  Service: Cardiovascular;  Laterality: N/A;  . BIV ICD GENERATOR CHANGEOUT N/A 01/30/2019   Procedure: BIV ICD GENERATOR CHANGEOUT;  Surgeon: Evans Lance, MD;  Location: [place] CV LAB;  Service: Cardiovascular;  Laterality: N/A;  . CARDIAC CATHETERIZATION    . CARDIAC VALVE REPLACEMENT    . CARDIOVERSION N/A 11/13/2016   Procedure: CARDIOVERSION;  Surgeon: Sanda Klein, MD;  Location: [facility] ENDOSCOPY;  Service: Cardiovascular;  Laterality: N/A;  . COLONOSCOPY  06/12/2007   Rourk- Normal rectum/Normal colon  . COLONOSCOPY  12/16/02   small hemorrhoids  . COLONOSCOPY  06/20/2012   Procedure: COLONOSCOPY;  Surgeon: Daneil Dolin, MD;  Location: [facility] ENDO SUITE;  Service: Endoscopy;  Laterality: N/A;  10:45  . defibrillator implanted 2006    . DOPPLER ECHOCARDIOGRAPHY  2012  . DOPPLER ECHOCARDIOGRAPHY   05,06,07,08,09,2011  . ESOPHAGOGASTRODUODENOSCOPY  06/12/2007   Rourk- normal esophagus, small hiatal hernia, otherwise normal stomach, D1, D2  . EYE SURGERY Right 2005  . EYE SURGERY Left 2006  . ICD LEAD REMOVAL Left 02/08/2015   Procedure: ICD LEAD REMOVAL;  Surgeon: Evans Lance, MD;  Location: [facility] OR;  Service: Cardiovascular;  Laterality: Left;  "Will plan extraction and insertion of a BiV PM"  **Dr. Roxan Hockey backing up case**  . IMPLANTABLE CARDIOVERTER DEFIBRILLATOR (ICD) GENERATOR CHANGE Left 02/08/2015   Procedure: ICD GENERATOR CHANGE;  Surgeon: Evans Lance, MD;  Location: [place];  Service: Cardiovascular;  Laterality: Left;  . INSERT / REPLACE / REMOVE PACEMAKER    . PACEMAKER INSERTION  May 2016  . pacemaker placed  2004  . right breast cyst removed     benign   . right cataract removed     2005  . TEE WITHOUT CARDIOVERSION N/A 11/13/2016   Procedure: TRANSESOPHAGEAL ECHOCARDIOGRAM (TEE);  Surgeon: Sanda Klein, MD;  Location: [facility] ENDOSCOPY;  Service: Cardiovascular;  Laterality: N/A;  . THYROIDECTOMY      Social History  reports that she quit smoking about 32 years ago. Her smoking use included cigarettes. She has a 30.00 pack-year smoking history. She has never used smokeless tobacco. She reports that she does not drink alcohol or use drugs.  Allergies  Allergen Reactions  . Penicillins Other (See Comments)    Bruise Did it involve swelling of the face/tongue/throat, SOB, or low BP? No Did it involve sudden or severe rash/hives, skin peeling, or any reaction on the inside of your mouth or nose? No Did you need to seek medical attention at a hospital or doctor's office? No When did it last happen?20+ years If all above answers are "NO", may proceed with cephalosporin use.   . Aspirin Other (See Comments)    On coumadin  . Fentanyl Dermatitis    Rash with patch   . Niacin Itching    Family History  Problem Relation Age of Onset  . Pancreatic  cancer Mother   . Heart disease Father   . Heart disease Sister   . Heart attack Sister   . Heart disease Brother   . Diabetes Brother   . Heart disease Brother   . Diabetes Brother   . Hypertension Brother   . Lung cancer Brother      Prior to Admission medications   Medication Sig Start Date End Date Taking? Authorizing Provider  acetaminophen (TYLENOL) 500 MG tablet Take 500 mg by mouth every 6 (six) hours as needed (pain).   Yes [provider]  albuterol (PROVENTIL) (2.5 MG/3ML) 0.083% nebulizer solution 90INHALE 1 VIAL VIA NEBULIZER THREE TIMES DAILY. Patient taking differently: Take 2.5 mg by nebulization in the morning and at bedtime. *May use additional one time if needed for shortness of breath 01/12/20  Yes Fayrene Helper, MD  albuterol (VENTOLIN HFA) 108 (90 Base) MCG/ACT inhaler INHALE 2 PUFFS INTO THE LUNGS EVERY 6 HOURS AS NEEDED FOR WHEEZING OR SHORTNESS OF BREATH Patient taking differently: Inhale 1-2 puffs into the lungs every 6 (six) hours as needed for wheezing or shortness of breath.  08/25/19  Yes Fayrene Helper, MD  allopurinol (ZYLOPRIM) 100 MG tablet TAKE 1 TABLET(100 MG) BY MOUTH DAILY Patient taking differently: Take 100 mg by mouth in the morning.  12/30/19  Yes Fayrene Helper, MD  BREO ELLIPTA 100-25 MCG/INH AEPB 1 TAKE 1 INHALATION BY MOUTH ONCE DAILY Patient taking differently: Inhale 1 puff into the lungs in the morning.  08/25/19  Yes Fayrene Helper, MD  cholecalciferol (VITAMIN D3) 25 MCG (1000 UT) tablet Take 1,000 Units by mouth in the morning.    Yes [provider]  docusate sodium (COLACE) 100 MG capsule Take 300 mg by mouth at bedtime.    Yes [provider]  fluticasone (FLONASE) 50 MCG/ACT nasal spray SHAKE LIQUID AND USE 2 SPRAYS IN EACH NOSTRIL DAILY Patient taking differently: Place 2 sprays into both nostrils every morning.  11/24/19  Yes Fayrene Helper, MD  folic acid (FOLVITE) 1 MG tablet TAKE  1 TABLET(1 MG) BY MOUTH DAILY Patient taking differently: Take 1 mg by mouth every evening.  11/10/19  Yes Fayrene Helper, MD  furosemide (LASIX) 40 MG tablet Take 1 tablet (40 mg total) by mouth daily. Patient taking differently: Take 40 mg by mouth every morning.  08/19/19  Yes Fayrene Helper, MD  gabapentin (NEURONTIN)  300 MG capsule Take 1 capsule (300 mg total) by mouth 2 (two) times daily. Patient taking differently: Take 300 mg by mouth at bedtime.  05/31/19  Yes Barton Dubois, MD  hydrALAZINE (APRESOLINE) 25 MG tablet Take 1 tablet (25 mg total) by mouth 2 (two) times daily. 05/19/19  Yes Croitoru, Mihai, MD  isosorbide mononitrate (IMDUR) 30 MG 24 hr tablet Take 1 tablet (30 mg total) by mouth daily. Patient taking differently: Take 15 mg by mouth every morning.  12/29/19 03/28/20 Yes Croitoru, Mihai, MD  Liniments (SALONPAS PAIN RELIEF PATCH EX) Apply 1 patch topically daily as needed (pain).   Yes [provider]  meclizine (ANTIVERT) 12.5 MG tablet Take 1 tablet (12.5 mg total) by mouth 3 (three) times daily as needed for dizziness. 09/02/19  Yes Fayrene Helper, MD  metolazone (ZAROXOLYN) 2.5 MG tablet Take one tablet on Saturdays. Patient taking differently: Take 2.5 mg by mouth every Saturday.  11/24/19  Yes Croitoru, Mihai, MD  metoprolol succinate (TOPROL-XL) 100 MG 24 hr tablet TAKE 1 TABLET BY MOUTH EVERY DAY WITH FOOD OR MILK Patient taking differently: Take 100 mg by mouth every evening.  11/17/19  Yes Fayrene Helper, MD  Multiple Vitamin (MULTIVITAMIN) LIQD Take 5 mLs by mouth daily.   Yes [provider]  nitroGLYCERIN (NITROSTAT) 0.4 MG SL tablet Place 1 tablet (0.4 mg total) under the tongue every 5 (five) minutes as needed for chest pain. 08/03/19  Yes Rolland Porter, MD  ondansetron (ZOFRAN ODT) 4 MG disintegrating tablet Take 1 tablet (4 mg total) by mouth every 8 (eight) hours as needed for nausea or vomiting. 01/26/20  Yes Fawze, Mina A, PA-C    ondansetron (ZOFRAN) 4 MG tablet Take 4 mg by mouth every 8 (eight) hours as needed for nausea or vomiting.   Yes [provider]  Polyethyl [name] (SYSTANE) 0.4-0.3 % GEL ophthalmic gel Place 1 application into the left eye at bedtime.   Yes [provider]  polyethylene glycol powder (GLYCOLAX/MIRALAX) 17 GM/SCOOP powder Take 17 g by mouth daily as needed for mild constipation or moderate constipation.   Yes [provider]  potassium chloride SA (KLOR-CON) 20 MEQ tablet TAKE 1 TABLET(20 MEQ) BY MOUTH DAILY Patient taking differently: Take 20 mEq by mouth daily.  01/28/20  Yes Fayrene Helper, MD  pravastatin (PRAVACHOL) 40 MG tablet TAKE 1 TABLET BY MOUTH EVERY EVENING Patient taking differently: Take 40 mg by mouth every evening.  11/10/19  Yes Fayrene Helper, MD  SYNTHROID 75 MCG tablet TAKE 1 TABLET BY MOUTH DAILY Patient taking differently: Take 75 mcg by mouth daily before breakfast.  10/27/19  Yes Fayrene Helper, MD  warfarin (JANTOVEN) 5 MG tablet TAKE 2 TABLETS EVERY DAY EXCEPT 1 TABLET ON SATURDAY Patient taking differently: Take 5-10 mg by mouth See admin instructions. TAKE 2 TABLETS EVERY DAY EXCEPT 1 TABLET ON Saturday @ 8:30PM 11/10/19  Yes Evans Lance, MD    Physical Exam: Vitals:   01/31/20 0600 01/31/20 0630 01/31/20 0730 01/31/20 0800  BP: (!) 169/68 (!) 171/79 (!) 182/76 (!) 185/76  Pulse: 60 60 (!) 59 60  Resp: (!) 23 (!) 35 20 (!) 27  Temp:      TempSrc:      SpO2: 92% 100% 97% 91%      Constitutional: Pleasant elderly female, moderately built and overweight lying propped up in bed, mildly dyspneic on speaking and mildly tachypneic. Eyes: PERTLA, lids and conjunctivae normal  ENMT: Mucous membranes are moist. Posterior pharynx clear of any exudate or lesions. Normal dentition.  Neck: supple, no masses, no thyromegaly.  Has thyroidectomy scar at base of neck. Respiratory: Reduced breath sounds in the bases with  few bibasal crackles.  Rest of lung fields clear to auscultation.  Midline sternotomy scar.  Respiratory rate in the mid to high 20s. Cardiovascular: S1 & S2 heard, regular rate and rhythm.  JVD + +.  Midsystolic murmur best heard at the apex.  Crisp click of mechanical valves heard.  Pacemaker box left upper anterior chest.  2+ pitting bilateral leg edema.  Telemetry personally reviewed: Paced rhythm. Abdomen: Non distended. Non tender. Soft. No organomegaly or masses appreciated. No clinical Ascites. Normal bowel sounds heard. Musculoskeletal: no clubbing / cyanosis. No joint deformity upper and lower extremities. Good ROM, no contractures. Normal muscle tone.  Skin: no rashes, lesions, ulcers. No induration Neurologic: CN 2-12 grossly intact. Sensation intact, DTR normal. Strength 5/5 in all 4 limbs.  Psychiatric: Normal judgment and insight. Alert and oriented x 3. Normal mood.     Labs on Admission: I have personally reviewed following labs and imaging studies  CBC: Recent Labs  Lab 01/26/20 1918 01/30/20 2340 01/30/20 2356  WBC 6.3 6.9  --   NEUTROABS 5.1 5.7  --   HGB 8.8* 9.1* 10.2*  HCT 28.9* 30.1* 30.0*  MCV 97.0 97.7  --   PLT 169 182  --     Basic Metabolic Panel: Recent Labs  Lab 01/26/20 1918 01/30/20 2340 01/30/20 2356  NA 133* 136 136  K 5.1 4.8 4.7  CL 95* 99 97*  CO2 27 28  --   GLUCOSE 101* 104* 101*  BUN 49* 35* 33*  CREATININE 1.55* 1.41* 1.40*  CALCIUM 8.8* 8.9  --     Liver Function Tests: Recent Labs  Lab 01/26/20 1918 01/30/20 2340  AST 25 25  ALT 17 16  ALKPHOS 48 53  BILITOT 0.7 0.3  PROT 7.4 7.7  ALBUMIN 3.7 3.8    Urine analysis:    Component Value Date/Time   COLORURINE YELLOW 01/26/2020 2055   APPEARANCEUR CLEAR 01/26/2020 2055   LABSPEC 1.010 01/26/2020 2055   PHURINE 5.0 01/26/2020 2055   GLUCOSEU NEGATIVE 01/26/2020 2055   GLUCOSEU NEG mg/dL 03/22/2010 2313   HGBUR NEGATIVE 01/26/2020 2055   HGBUR negative 05/27/2010  1122   BILIRUBINUR NEGATIVE 01/26/2020 2055   BILIRUBINUR negative 04/28/2019 1500   BILIRUBINUR neg 03/11/2013 1443   [place] 01/26/2020 2055   PROTEINUR NEGATIVE 01/26/2020 2055   UROBILINOGEN 0.2 04/28/2019 1500   UROBILINOGEN 0.2 03/27/2012 1201   NITRITE NEGATIVE 01/26/2020 2055   LEUKOCYTESUR NEGATIVE 01/26/2020 2055     Radiological Exams on Admission: DG Chest Port 1 View  Result Date: 01/30/2020 CLINICAL DATA:  Patient presents with complaints of shortness of breath that have not gotten better since being seen at [facility] a few days ago. Patient states worsening exertional SOB. Patient is on 2L [place] at home and her sats have been 95%+ on her O2. Patient states she has not had to increase O2 demand. Patient hx COPD. EXAM: PORTABLE CHEST 1 VIEW COMPARISON:  01/02/2020 FINDINGS: Stable changes from prior cardiac surgery and valve replacement. Cardiac silhouette is mildly enlarged. Enlarged pulmonary arteries. No mediastinal or hilar masses. Left anterior chest wall biventricular cardioverter-defibrillator is stable. Lungs demonstrate interstitial thickening similar to the prior study. No lung consolidation. No convincing pulmonary edema. No pleural effusion pneumothorax. Skeletal structures are  demineralized but grossly intact. IMPRESSION: 1. No acute cardiopulmonary disease. 2. Cardiomegaly, changes from cardiac surgery and valve replacement, chronic interstitial thickening and prominent pulmonary arteries suggesting pulmonary hypertension, stable from the prior radiographs. Electronically Signed   By: Lajean Manes M.D.   On: 01/30/2020 23:48    EKG: Independently reviewed.  Atrial flutter and V paced rhythm.  Assessment/Plan Principal Problem:   Acute on chronic diastolic CHF (congestive heart failure) (HCC) Active Problems:   Hypothyroidism, postsurgical   Hyperlipidemia LDL goal <70   Essential hypertension   Biventricular automatic implantable cardioverter defibrillator in  situ   CKD (chronic kidney disease), stage IV (HCC)   Chronic anticoagulation, on coumadin for Mechanical AVR and MVR   COPD (chronic obstructive pulmonary disease) (HCC)   CHB (complete heart block) (HCC)   Persistent atrial fibrillation (HCC)   Presence of cardiac pacemaker   GERD (gastroesophageal reflux disease)   Acute on chronic respiratory failure with hypoxia (HCC)     Acute on chronic diastolic CHF: Possibly precipitated by reduction in diuretic dose and dietary indiscretion/increased salt intake.  Patient counseled.  Continue IV Lasix 60 mg every 12 hours and prior home dose of metolazone 2.5 mg q. Saturdays.  May need to be on additional diuretics at discharge.  TTE 03/18/2019 showed LVEF of 55%, will repeat TTE this admission.  Continue prior home dose of Toprol-XL.  No ACE I/ARB due to renal insufficiency.  -2 L thus far, good response.  Patient follows with Dr. Sallyanne Kuster and Dr. Crissie Sickles, Cardiology.  If patient does not continue to improve or worsens in the next 24 hours, may consider cardiology consultation in-house.  Acute on chronic hypoxic respiratory failure: Secondary to decompensated CHF.  Oxygen support.  Treat CHF as above.  Postsurgical hypothyroidism: Continue Synthroid.  Hyperlipidemia: Continue statins.  Essential hypertension: Uncontrolled, has not received her medications since yesterday.  Resume home medications and monitor closely.  Complete heart block s/p pacemaker: Outpatient follow-up with cardiology.  Stage IIIb CKD: Although listed as stage IV, appears to be stage III.  Creatinine currently at baseline.  Monitor closely while on aggressive IV diuresis.  S/p mechanical mitral and aortic valve: INR 2.3, goal likely 2.5-3.5.  Coumadin per pharmacy.  COPD: Former smoker.  No clinical bronchospasm.  As needed bronchodilator nebulizations.  Persistent atrial flutter/A. fib: Continue Toprol-XL and Coumadin per pharmacy, anticoagulated for this  indication.  GERD: Does not use Pepcid.  Anemia: Suspect chronic disease, iron deficiency and others.  Stable.  Outpatient follow-up.  Addendum:  Earlier this afternoon, patient complained of left ankle/foot pain.  I reevaluated patient at bedside while still in the ED.  Patient reports that this is a chronic issue and has intermittent pain for which he uses topical Salonpas which relieves her pain.  On exam, no acute findings i.e. swelling, increased warmth, redness or tenderness.  Does not even have much pain full range of movements. She preferred to use this over hydrocodone which she has used and tolerated in the past.  I had ordered Salonpas but pharmacy does not carry it and hence switched to lidocaine patch.  As needed hydrocodone.  If persists or worsens, may consider x-ray of her left ankle and foot.  Low index of suspicion for DVT and moreover patient is therapeutically anticoagulated on Coumadin for this indication.  Followed up TTE results: LVEF 30-35% with global hypokinesis.  This would make it acute on chronic combined CHF.  Recommend calling Cardiology consultation on 5/9.   DVT  prophylaxis: Anticoagulated on Coumadin. Code Status: Full, confirmed with patient. Family Communication: None at bedside. Disposition Plan:   Patient is from:  Home.  Anticipated DC to:  Home.  Anticipated DC date:  02/02/2020  Anticipated DC barriers: Decompensated CHF and hypoxia   Consults called: None Admission status: Inpatient/telemetry  Severity of Illness: The appropriate patient status for this patient is INPATIENT. Inpatient status is judged to be reasonable and necessary in order to provide the required intensity of service to ensure the patient's safety. The patient's presenting symptoms, physical exam findings, and initial radiographic and laboratory data in the context of their chronic comorbidities is felt to place them at high risk for further clinical deterioration. Furthermore, it  is not anticipated that the patient will be medically stable for discharge from the hospital within 2 midnights of admission. The following factors support the patient status of inpatient.   " The patient's presenting symptoms include worsening dyspnea, leg swelling and weight gain. " The worrisome physical exam findings include elevated JVD, 2+ pitting leg edema, basal crackles, tachypnea, hypoxia. " The initial radiographic and laboratory data are worrisome because of chest x-ray suggestive of pulmonary edema, creatinine 1.4. " The chronic co-morbidities include chronic diastolic CHF, mechanical mitral and aortic valve on Coumadin anticoagulation, stage III chronic kidney disease, persistent A. fib.   * I certify that at the point of admission it is my clinical judgment that the patient will require inpatient hospital care spanning beyond 2 midnights from the point of admission due to high intensity of service, high risk for further deterioration and high frequency of surveillance required.Vernell Leep MD [facility]  To contact the attending provider between 7A-7P or the covering provider during after hours 7P-7A, please log into the web site [email or web address] and access using universal [place] password for that web site. If you do not have the password, please call the hospital operator.  01/31/2020, 8:15 AM

## 2020-01-31 NOTE — Plan of Care (Signed)
Patient will call for assistance when needing to get out bed. Bed alarm set. Call bell and personal belongings are within reach.

## 2020-01-31 NOTE — ED Notes (Signed)
Pure wick placed in efforts to calculate pt urinary output. PT comfortable at this time.

## 2020-02-01 DIAGNOSIS — N184 Chronic kidney disease, stage 4 (severe): Secondary | ICD-10-CM

## 2020-02-01 DIAGNOSIS — Z950 Presence of cardiac pacemaker: Secondary | ICD-10-CM

## 2020-02-01 LAB — BASIC METABOLIC PANEL
Anion gap: 10 (ref 5–15)
BUN: 36 mg/dL — ABNORMAL HIGH (ref 8–23)
CO2: 34 mmol/L — ABNORMAL HIGH (ref 22–32)
Calcium: 9.2 mg/dL (ref 8.9–10.3)
Chloride: 93 mmol/L — ABNORMAL LOW (ref 98–111)
Creatinine, Ser: 1.53 mg/dL — ABNORMAL HIGH (ref 0.44–1.00)
GFR calc Af Amer: 36 mL/min — ABNORMAL LOW (ref >60)
GFR calc non Af Amer: 31 mL/min — ABNORMAL LOW (ref >60)
Glucose, Bld: 84 mg/dL (ref 70–99)
Potassium: 4.5 mmol/L (ref 3.5–5.1)
Sodium: 137 mmol/L (ref 135–145)

## 2020-02-01 LAB — CBC WITH DIFFERENTIAL/PLATELET
Abs Immature Granulocytes: 0.07 10*3/uL (ref 0.00–0.07)
Basophils Absolute: 0 10*3/uL (ref 0.0–0.1)
Basophils Relative: 0 %
Eosinophils Absolute: 0 10*3/uL (ref 0.0–0.5)
Eosinophils Relative: 0 %
HCT: 31.7 % — ABNORMAL LOW (ref 36.0–46.0)
Hemoglobin: 9.7 g/dL — ABNORMAL LOW (ref 12.0–15.0)
Immature Granulocytes: 1 %
Lymphocytes Relative: 13 %
Lymphs Abs: 0.8 10*3/uL (ref 0.7–4.0)
MCH: 29.4 pg (ref 26.0–34.0)
MCHC: 30.6 g/dL (ref 30.0–36.0)
MCV: 96.1 fL (ref 80.0–100.0)
Monocytes Absolute: 0.7 10*3/uL (ref 0.1–1.0)
Monocytes Relative: 11 %
Neutro Abs: 4.5 10*3/uL (ref 1.7–7.7)
Neutrophils Relative %: 75 %
Platelets: 181 10*3/uL (ref 150–400)
RBC: 3.3 MIL/uL — ABNORMAL LOW (ref 3.87–5.11)
RDW: 16.9 % — ABNORMAL HIGH (ref 11.5–15.5)
WBC: 6 10*3/uL (ref 4.0–10.5)
nRBC: 0 % (ref 0.0–0.2)

## 2020-02-01 LAB — PROTIME-INR
INR: 2.4 — ABNORMAL HIGH (ref 0.8–1.2)
Prothrombin Time: 25.3 seconds — ABNORMAL HIGH (ref 11.4–15.2)

## 2020-02-01 MED ORDER — WARFARIN SODIUM 5 MG PO TABS
10.0000 mg | ORAL_TABLET | Freq: Once | ORAL | Status: AC
  Administered 2020-02-01: 10 mg via ORAL
  Filled 2020-02-01: qty 2

## 2020-02-01 MED ORDER — FUROSEMIDE 10 MG/ML IJ SOLN
40.0000 mg | Freq: Two times a day (BID) | INTRAMUSCULAR | Status: DC
  Administered 2020-02-01 – 2020-02-02 (×2): 40 mg via INTRAVENOUS
  Filled 2020-02-01 (×2): qty 4

## 2020-02-01 NOTE — Progress Notes (Signed)
ANTICOAGULATION CONSULT NOTE -  Consult  Pharmacy Consult for warfarin  Indication: Mechanical mitral and aortic valve  Allergies  Allergen Reactions  . Penicillins Other (See Comments)    Bruise Did it involve swelling of the face/tongue/throat, SOB, or low BP? No Did it involve sudden or severe rash/hives, skin peeling, or any reaction on the inside of your mouth or nose? No Did you need to seek medical attention at a hospital or doctor's office? No When did it last happen?20+ years If all above answers are "NO", may proceed with cephalosporin use.   . Aspirin Other (See Comments)    On coumadin  . Fentanyl Dermatitis    Rash with patch   . Niacin Itching    Patient Measurements: Height: 5\' 7"  (170.2 cm) Weight: 84.2 kg (185 lb 10 oz) IBW/kg (Calculated) : 61.6 Heparin Dosing Weight:   Vital Signs: Temp: 98.8 F (37.1 C) (05/09 0520) Temp Source: Oral (05/09 0520) BP: 166/80 (05/09 0520) Pulse Rate: 60 (05/09 0520)  Labs: Recent Labs    01/30/20 2340 01/30/20 2340 01/30/20 2356 01/31/20 0132 02/01/20 0515  HGB 9.1*   < > 10.2*  --  9.7*  HCT 30.1*  --  30.0*  --  31.7*  PLT 182  --   --   --  181  LABPROT 24.2*  --   --   --  25.3*  INR 2.3*  --   --   --  2.4*  CREATININE 1.41*  --  1.40*  --  1.53*  TROPONINIHS 31*  --   --  31*  --    < > = values in this interval not displayed.    Estimated Creatinine Clearance: 30 mL/min (A) (by C-G formula based on SCr of 1.53 mg/dL (H)).   Medical History: Past Medical History:  Diagnosis Date  . Acute on chronic diastolic CHF (congestive heart failure) ([place]) 05/19/2012  . Adenomatous polyp of colon 12/16/2002   Dr. [name]/[facility]  . Allergy   . Calculus of gallbladder with chronic cholecystitis without obstruction 02/09/2009   Qualifier: Diagnosis of  By: [name], [name]    . Cataract   . CHB (complete heart block) ([place]) 10/07/2014      . CHF (congestive heart  failure) ([place])    a.  EF previously reduced to 25-30% b. EF improved to 60-65% by echo in 10/2017.   Marland Kitchen Chronic kidney disease, stage I 2006  . Constipation       . COPD (chronic obstructive pulmonary disease) ([place])       . DJD (degenerative joint disease) of lumbar spine   . DM (diabetes mellitus) ([place]) 2006  . Esophageal reflux   . Glaucoma   . Gout       . H/O adenomatous polyp of colon 05/24/2012   2004 in [place] colonoscopy 2008 normal    . H/O heart valve replacement with mechanical valve    a. s/p mechanical AVR in 2001 and mechanical MVR in 2004.  Marland Kitchen H/O wisdom tooth extraction   . HIP PAIN, RIGHT 04/23/2009   Qualifier: Diagnosis of  By: [name], [name]    . Hyperlipemia   . IDA (iron deficiency anemia)    Parenteral iron/[name]  . Insomnia       . Nausea without vomiting 06/07/2010   Qualifier: Diagnosis of  By: [name], [name]    . Non-ischemic cardiomyopathy ([place])    a. s/p MDT CRTD  .  Obesity, unspecified   . Other and unspecified hyperlipidemia   . Oxygen deficiency 2014   nocturnal;  . Papilloma of breast 03/06/2012   This was diagnosed as a left breast papilloma by ultrasound characteristics and symptoms in 2010. Because of possible medical risk factors no surgical intervention has been done and it did doing annual followups. The area has been stable by physical examination and mammograms and ultrasound since 2010.   Marland Kitchen Spinal stenosis of lumbar region 02/19/2013  . Spondylolisthesis   . Spondylosis   . Thyroid cancer ([place])    remote thyroidectomy, no recurrence, pt denies in 2016 thyroid cancer  . Type 2 diabetes mellitus with hyperglycemia ([place]) 07/15/2010  . Unspecified hypothyroidism       . 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 Vertigo         Medications:  Scheduled:   Assessment: Pharmacy is consulted to dose warfarin in 86 yo female with PMH of Mechanical mitral and aortic valve replacement.   Per  med rec, pt PTA regimen is warfarin 10 mg PO daily except 5 mg on saturdays. This is confirmed by the most recent visit pt had to anticoagulation clinic on 12/31/2019 as available in Chart Review. At this visit, states that the 5 mg dose is on Tuesdays, but either maintains same total weekly dose. In this same note, it is noted that the target INR for this pt is 2.0 to 2.5 per 04/30/17 INR goal decreased per Dt Taylor's order. Target INR was decreased due to large bruising.   Today, 02/01/20  INR is 2.4, therapeutic per modified target range   Hgb 10.2, plt 182, Hgb low but stable  Meals that are charted are charted as 100% eaten. Pt on heart healthy diet   No new interacting medications started     Goal of Therapy:  INR 2.0-2.5 Monitor platelets by anticoagulation protocol: Yes   Plan:   Warfarin 10 mg PO x 1   Daily INR   Monitor oral intake  Monitor for start of any new interacting medication  Monitor for signs and symptoms of bleeding   Royetta Asal, PharmD, BCPS 02/01/2020 10:15 AM

## 2020-02-01 NOTE — Progress Notes (Signed)
PROGRESS NOTE  [name] [name] POE:[id number] DOB: January 01, 1932 DOA: 01/30/2020 PCP: Fayrene Helper, MD   LOS: 1 day   Brief narrative: As per HPI,   [name] [name] is a 86 year old female, lives with her nephew, ambulates with the help of a cane or a walker, PMH of chronic diastolic CHF, atypical atrial flutter/A. fib, complete heart block-pacemaker dependent, CRT-P, s/p mechanical mitral and aortic valve replacement on Coumadin anticoagulation, prosthetic valve associated hemolysis, essential hypertension, CKD stage IIIb, COPD, chronic hypoxic respiratory failure on home oxygen 2 L/min, GERD and gout, presented to the [facility] ED on 01/30/2020 due to progressive dyspnea, leg swelling and weight gain.  Patient had symptoms for several days with dyspnea at rest and on mild exertion with increasing leg swelling and weight gain.  Due to feeling of weakness she had cut down her diuretics.  Completed COVID-19 vaccination.  Utilized her home inhalers with minimal improvement.  Since ED, received Lasix 60 mg IV x1 with good urine output and breathing feels better but not yet at baseline.  ED Course: Afebrile, tachypneic, normal heart rate, elevated blood pressures up to systolic in the 315Q.  Hypoxic with oxygen saturation in the low 90s on 2 L/min oxygen via nasal cannula.  ABG: pH 7.404, PCO2 47, PO2 61, HCO3 28.7 and oxygen saturation 89.4.  CMP significant for BUN 35, creatinine 1.41.  BNP 778.  Troponin plateaued in the low 30s.  Hemoglobin 9.1.  INR 2.3.  Flu panel and Covid 19 RT-PCR negative.  Chest x-ray personally reviewed, suggestive of pulmonary edema, surgical clips, cardiomegaly, pacemaker.  Assessment/Plan:  Principal Problem:   Acute on chronic diastolic CHF (congestive heart failure) (HCC) Active Problems:   Hypothyroidism, postsurgical   Hyperlipidemia LDL goal <70   Essential hypertension   Biventricular automatic implantable cardioverter defibrillator in situ   CKD  (chronic kidney disease), stage IV (HCC)   Chronic anticoagulation, on coumadin for Mechanical AVR and MVR   COPD (chronic obstructive pulmonary disease) (HCC)   CHB (complete heart block) (HCC)   Persistent atrial fibrillation (HCC)   Presence of cardiac pacemaker   GERD (gastroesophageal reflux disease)   Acute on chronic respiratory failure with hypoxia (HCC)  Acute on chronic diastolic CHF: Recently decreased on diuretic dose.  On Lasix 60 IV twice daily at this time.  Patient has felt little better today.  On  metolazone 2.5 mg q. Saturdays.  May need to be on additional diuretics at discharge.  TTE 03/18/2019 showed LVEF of 55%.  Repeat 2D echocardiogram showed decreased LV function.  Continue Toprol-XL.  Hold ACE inhibitor/ ARB.   Good diuretic response so far. Patient follows with Dr. Sallyanne Kuster and Dr. Crissie Sickles, Cardiology.  Will consider for cardiology evaluation in a.m.  Troponin borderline elevated but no chest pain.  EKG with ventricular paced rhythm.  Acute on chronic hypoxic respiratory failure: Secondary to decompensated CHF.    On 2 L of oxygen by nasal cannula  Postsurgical hypothyroidism: Continue Synthroid.  Hyperlipidemia: Continue statins.  Essential hypertension:  Continue hydralazine, Imdur, metoprolol  Complete heart block s/p pacemaker: Outpatient follow-up with cardiology.  Stage IIIb CKD:.  Creatinine currently at baseline, creatinine at 1.5.  Monitor closely while on  IV diuresis.  S/p mechanical mitral and aortic valve: INR 2.3, goal likely 2.5-3.5.  Coumadin per pharmacy.  COPD: Former smoker.  No clinical bronchospasm.  As needed bronchodilator nebulizations.  Persistent atrial flutter/A. fib: Continue Toprol-XL and Coumadin per pharmacy, on anticoagulation.  Anemia: Suspect  chronic disease, iron deficiency. Stable.  Outpatient follow-up.  VTE Prophylaxis: Warfarin  Code Status: Full code  Family Communication: None  Status is:  Inpatient  Remains inpatient appropriate because:Inpatient level of care appropriate due to severity of illness and Congestive heart failure needing IV diuresis,   Dispo: The patient is from: Home              Anticipated d/c is to: Home.  Patient lives with her niece at home.              Anticipated d/c date is: 2 days              Patient currently is not medically stable to d/c.  Consultants:  None  Procedures:  None  Antibiotics:  . None  Anti-infectives (From admission, onward)   None     Subjective: Today, patient was seen and examined at bedside.  Patient complains of breathing little better today.  Patient denies any shortness of breath, chest pain, palpitation cough  Objective: Vitals:   02/01/20 0520 02/01/20 0804  BP: (!) 166/80   Pulse: 60   Resp: 20   Temp: 98.8 F (37.1 C)   SpO2: 97% 98%    Intake/Output Summary (Last 24 hours) at 02/01/2020 1233 Last data filed at 02/01/2020 0944 Gross per 24 hour  Intake 243 ml  Output 3050 ml  Net -2807 ml   Filed Weights   01/31/20 1954 02/01/20 0500  Weight: 87.5 kg 84.2 kg   Body mass index is 29.07 kg/m.   Physical Exam: GENERAL: Patient is alert awake and oriented. Not in obvious distress.  Nasal cannula oxygen HENT: No scleral pallor or icterus. Pupils equally reactive to light. Oral mucosa is moist.  Neck scar NECK: is supple, no gross swelling noted. CHEST: Diminished breath sounds bilateral.  Few basal crackles.  Midline sternal scar CVS: S1 and S2 heard, systolic murmur noted..  Mechanical click noted.  Regular rate and rhythm.  Left chest wall pacemaker in place, 2+ pitting edema. ABDOMEN: Soft, non-tender, bowel sounds are present. EXTREMITIES: No edema. CNS: Cranial nerves are intact. No focal motor deficits. SKIN: warm and dry without rashes.  Data Review: I have personally reviewed the following laboratory data and studies,  CBC: Recent Labs  Lab 01/26/20 1918 01/30/20 2340  01/30/20 2356 02/01/20 0515  WBC 6.3 6.9  --  6.0  NEUTROABS 5.1 5.7  --  4.5  HGB 8.8* 9.1* 10.2* 9.7*  HCT 28.9* 30.1* 30.0* 31.7*  MCV 97.0 97.7  --  96.1  PLT 169 182  --  466   Basic Metabolic Panel: Recent Labs  Lab 01/26/20 1918 01/30/20 2340 01/30/20 2356 02/01/20 0515  NA 133* 136 136 137  K 5.1 4.8 4.7 4.5  CL 95* 99 97* 93*  CO2 27 28  --  34*  GLUCOSE 101* 104* 101* 84  BUN 49* 35* 33* 36*  CREATININE 1.55* 1.41* 1.40* 1.53*  CALCIUM 8.8* 8.9  --  9.2   Liver Function Tests: Recent Labs  Lab 01/26/20 1918 01/30/20 2340  AST 25 25  ALT 17 16  ALKPHOS 48 53  BILITOT 0.7 0.3  PROT 7.4 7.7  ALBUMIN 3.7 3.8   Recent Labs  Lab 01/26/20 1918  LIPASE 47   No results for input(s): AMMONIA in the last 168 hours. Cardiac Enzymes: No results for input(s): CKTOTAL, CKMB, CKMBINDEX, TROPONINI in the last 168 hours. BNP (last 3 results) Recent Labs    01/02/20 1315  01/26/20 1918 01/30/20 2340  BNP 516.0* 489.0* 778.2*    ProBNP (last 3 results) No results for input(s): PROBNP in the last 8760 hours.  CBG: No results for input(s): GLUCAP in the last 168 hours. Recent Results (from the past 240 hour(s))  Urine culture     Status: Abnormal   Collection Time: 01/26/20  6:47 PM   Specimen: Urine, Random  Result Value Ref Range Status   Specimen Description   Final    URINE, RANDOM Performed at [facility], [address], [place], [place] [address]    Special Requests   Final    NONE Performed at [facility], [address] [place], [place] [address]    Culture (A)  Final    <10,000 COLONIES/mL INSIGNIFICANT GROWTH Performed at [facility] [address], [place], [place] [address]    Report Status 01/28/2020 FINAL  Final  Respiratory Panel by RT PCR (Flu A&B, Covid) - Nasopharyngeal Swab     Status: None   Collection Time: 01/31/20  5:09 AM   Specimen: Nasopharyngeal Swab  Result Value Ref Range Status   SARS Coronavirus 2 by RT PCR  NEGATIVE NEGATIVE Final    Comment: (NOTE) SARS-CoV-2 target nucleic acids are NOT DETECTED. The SARS-CoV-2 RNA is generally detectable in upper respiratoy specimens during the acute phase of infection. The lowest concentration of SARS-CoV-2 viral copies this assay can detect is 131 copies/mL. A negative result does not preclude SARS-Cov-2 infection and should not be used as the sole basis for treatment or other patient management decisions. A negative result may occur with  improper specimen collection/handling, submission of specimen other than nasopharyngeal swab, presence of viral mutation(s) within the areas targeted by this assay, and inadequate number of viral copies (<131 copies/mL). A negative result must be combined with clinical observations, patient history, and epidemiological information. The expected result is Negative. Fact Sheet for Patients:  PinkCheek.be Fact Sheet for Healthcare Providers:  GravelBags.it This test is not yet ap proved or cleared by the [place] FDA and  has been authorized for detection and/or diagnosis of SARS-CoV-2 by FDA under an Emergency Use Authorization (EUA). This EUA will remain  in effect (meaning this test can be used) for the duration of the COVID-19 declaration under Section 564(b)(1) of the Act, 21 U.S.C. section 360bbb-3(b)(1), unless the authorization is terminated or revoked sooner.    Influenza A by PCR NEGATIVE NEGATIVE Final   Influenza B by PCR NEGATIVE NEGATIVE Final    Comment: (NOTE) The Xpert Xpress SARS-CoV-2/FLU/RSV assay is intended as an aid in  the diagnosis of influenza from Nasopharyngeal swab specimens and  should not be used as a sole basis for treatment. Nasal washings and  aspirates are unacceptable for Xpert Xpress SARS-CoV-2/FLU/RSV  testing. Fact Sheet for Patients: PinkCheek.be Fact Sheet for Healthcare  Providers: GravelBags.it This test is not yet approved or cleared by the [place] FDA and  has been authorized for detection and/or diagnosis of SARS-CoV-2 by  FDA under an Emergency Use Authorization (EUA). This EUA will remain  in effect (meaning this test can be used) for the duration of the  Covid-19 declaration under Section 564(b)(1) of the Act, 21  U.S.C. section 360bbb-3(b)(1), unless the authorization is  terminated or revoked. Performed at [facility], [place] [address], [place], [place] [address]      Studies: DG Chest Port 1 View  Result Date: 01/30/2020 CLINICAL DATA:  Patient presents with complaints of shortness  of breath that have not gotten better since being seen at [facility] a few days ago. Patient states worsening exertional SOB. Patient is on 2L [place] at home and her sats have been 95%+ on her O2. Patient states she has not had to increase O2 demand. Patient hx COPD. EXAM: PORTABLE CHEST 1 VIEW COMPARISON:  01/02/2020 FINDINGS: Stable changes from prior cardiac surgery and valve replacement. Cardiac silhouette is mildly enlarged. Enlarged pulmonary arteries. No mediastinal or hilar masses. Left anterior chest wall biventricular cardioverter-defibrillator is stable. Lungs demonstrate interstitial thickening similar to the prior study. No lung consolidation. No convincing pulmonary edema. No pleural effusion pneumothorax. Skeletal structures are demineralized but grossly intact. IMPRESSION: 1. No acute cardiopulmonary disease. 2. Cardiomegaly, changes from cardiac surgery and valve replacement, chronic interstitial thickening and prominent pulmonary arteries suggesting pulmonary hypertension, stable from the prior radiographs. Electronically Signed   By: Lajean Manes M.D.   On: 01/30/2020 23:48   ECHOCARDIOGRAM COMPLETE  Result Date: 01/31/2020    ECHOCARDIOGRAM REPORT   Patient Name:   [name] [name] Date of Exam: 01/31/2020 Medical Rec #:   [id number]      Height:       67.0 in Accession #:    [id number]     Weight:       193.0 lb Date of Birth:  Apr 08, 1932      BSA:          1.992 m Patient Age:    36 years       BP:           174/74 mmHg Patient Gender: F              HR:           102 bpm. Exam Location:  Inpatient Procedure: 2D Echo, Cardiac Doppler and Color Doppler Indications:    I50.40* Unspecified combined systolic (congestive) and diastolic                 (congestive) heart failure  History:        Patient has prior history of Echocardiogram examinations, most                 recent 03/18/2019. Cardiomyopathy and CHF, Abnormal ECG, COPD;                 Risk Factors:Hypertension. St Jude AVR. Mitral valve                 replacement. Covid 19 2/21. Hypoxia.  Sonographer:    Roseanna Rainbow RDCS Referring Phys: [place]  1. Left ventricular ejection fraction, by estimation, is 30 to 35%. The left ventricle has moderately decreased function. The left ventricle demonstrates global hypokinesis. There is mild concentric left ventricular hypertrophy. Left ventricular diastolic function could not be evaluated.  2. Right ventricular systolic function is normal. The right ventricular size is normal. There is severely elevated pulmonary artery systolic pressure. The estimated right ventricular systolic pressure is 22.2 mmHg.  3. Left atrial size was severely dilated.  4. Right atrial size was moderately dilated.  5. The mitral valve has been repaired/replaced. No evidence of mitral valve regurgitation.  6. The aortic valve has been repaired/replaced. Aortic valve regurgitation is not visualized. FINDINGS  Left Ventricle: Left ventricular ejection fraction, by estimation, is 30 to 35%. The left ventricle has moderately decreased function. The left ventricle demonstrates global hypokinesis. The left ventricular internal cavity size was normal in size. There is mild concentric left  ventricular hypertrophy. Left ventricular diastolic  function could not be evaluated due to atrial fibrillation. Left ventricular diastolic function could not be evaluated. Right Ventricle: The right ventricular size is normal. No increase in right ventricular wall thickness. Right ventricular systolic function is normal. There is severely elevated pulmonary artery systolic pressure. The tricuspid regurgitant velocity is 3.75 m/s, and with an assumed right atrial pressure of 15 mmHg, the estimated right ventricular systolic pressure is 81.8 mmHg. Left Atrium: Left atrial size was severely dilated. Right Atrium: Right atrial size was moderately dilated. Pericardium: There is no evidence of pericardial effusion. Mitral Valve: The mitral valve has been repaired/replaced. No evidence of mitral valve regurgitation. There is a mechanical valve present in the mitral position. MV peak gradient, 16.2 mmHg. The mean mitral valve gradient is 5.0 mmHg. Tricuspid Valve: The tricuspid valve is grossly normal. Tricuspid valve regurgitation is mild. Aortic Valve: The aortic valve has been repaired/replaced. Aortic valve regurgitation is not visualized. Aortic valve mean gradient measures 9.0 mmHg. Aortic valve peak gradient measures 23.0 mmHg. Aortic valve area, by VTI measures 1.98 cm. There is a bileaflet valve present in the aortic position. Pulmonic Valve: The pulmonic valve was grossly normal. Pulmonic valve regurgitation is not visualized. No evidence of pulmonic stenosis. Aorta: The aortic root and ascending aorta are structurally normal, with no evidence of dilitation. IAS/Shunts: The atrial septum is grossly normal. Additional Comments: A pacer wire is visualized.  LEFT VENTRICLE PLAX 2D LVIDd:         4.90 cm      Diastology LV PW:         1.58 cm      LV e' lateral:   5.77 cm/s LV IVS:        1.29 cm      LV E/e' lateral: 32.3 LVOT diam:     1.90 cm      LV e' medial:    5.22 cm/s LV SV:         78           LV E/e' medial:  35.7 LV SV Index:   39 LVOT Area:     2.84 cm   LV Volumes (MOD) LV vol d, MOD A2C: 88.9 ml LV vol d, MOD A4C: 101.0 ml LV vol s, MOD A2C: 35.6 ml LV vol s, MOD A4C: 56.6 ml LV SV MOD A2C:     53.3 ml LV SV MOD A4C:     101.0 ml LV SV MOD BP:      54.3 ml RIGHT VENTRICLE         IVC TAPSE (M-mode): 1.7 cm  IVC diam: 2.80 cm LEFT ATRIUM             Index       RIGHT ATRIUM           Index LA diam:        4.35 cm 2.18 cm/m  RA Area:     26.00 cm LA Vol (A2C):   88.9 ml 44.63 ml/m RA Volume:   92.90 ml  46.64 ml/m LA Vol (A4C):   94.9 ml 47.64 ml/m LA Biplane Vol: 95.5 ml 47.95 ml/m  AORTIC VALVE AV Area (Vmax):    1.90 cm AV Area (Vmean):   1.88 cm AV Area (VTI):     1.98 cm AV Vmax:           240.00 cm/s AV Vmean:          133.000  cm/s AV VTI:            0.393 m AV Peak Grad:      23.0 mmHg AV Mean Grad:      9.0 mmHg LVOT Vmax:         161.00 cm/s LVOT Vmean:        88.100 cm/s LVOT VTI:          0.275 m LVOT/AV VTI ratio: 0.70  AORTA Ao Root diam: 2.80 cm Ao Asc diam:  3.80 cm MITRAL VALVE                TRICUSPID VALVE MV Area (PHT): 4.13 cm     TR Peak grad:   56.2 mmHg MV Peak grad:  16.2 mmHg    TR Vmax:        375.00 cm/s MV Mean grad:  5.0 mmHg MV Vmax:       2.02 m/s     SHUNTS MV Vmean:      98.1 cm/s    Systemic VTI:  0.28 m MV Decel Time: 184 msec     Systemic Diam: 1.90 cm MV E velocity: 186.25 cm/s MV A velocity: 99.15 cm/s MV E/A ratio:  1.88 Mertie Moores MD Electronically signed by Mertie Moores MD Signature Date/Time: 01/31/2020/3:34:43 PM    Final       Flora Lipps, MD  [facility] 02/01/2020

## 2020-02-01 NOTE — Plan of Care (Signed)
  Problem: Education: Goal: Knowledge of General Education information will improve Description Including pain rating scale, medication(s)/side effects and non-pharmacologic comfort measures Outcome: Progressing   

## 2020-02-01 NOTE — Evaluation (Signed)
Physical Therapy Evaluation Patient Details Name: [name] [name] MRN: [id number] DOB: 1932-05-26 Today's Date: 02/01/2020   History of Present Illness  86 yo female admitted with CHF, bil ankle/foot pain. Hx of CHF, A flutter, Afib, pacemaker, CKD, COPD, gout, DM, vertigo  Clinical Impression  On eval, pt required Min assist for mobility. She walked ~12 feet around the room with a RW. Ambulation distance was limited 2* no O2 tank carrier/O2 extension tubing available on the unit. Will continue to follow and progress activity as tolerated.     Follow Up Recommendations Home health PT;Supervision - Intermittent    Equipment Recommendations  None recommended by PT    Recommendations for Other Services       Precautions / Restrictions Precautions Precautions: Fall Precaution Comments: O2 dep @ baseline-2L Restrictions Weight Bearing Restrictions: No      Mobility  Bed Mobility Overal bed mobility: Needs Assistance Bed Mobility: Supine to Sit     Supine to sit: Supervision;HOB elevated     General bed mobility comments: Increased time.  Transfers Overall transfer level: Needs assistance Equipment used: Rolling walker (2 wheeled) Transfers: Sit to/from Stand Sit to Stand: Min assist;From elevated surface         General transfer comment: Assist to power up, stabilize. Cues for hand placement. Increased time.  Ambulation/Gait Ambulation/Gait assistance: Min assist Gait Distance (Feet): 12 Feet Assistive device: Rolling walker (2 wheeled) Gait Pattern/deviations: Step-through pattern;Decreased stride length;Trunk flexed     General Gait Details: Walked a short distance around the room with use of a RW. Unable to locate O2 tank carriers/tube extension to walk a farther distance.  Stairs            Wheelchair Mobility    Modified Rankin (Stroke Patients Only)       Balance Overall balance assessment: Needs assistance         Standing balance support:  Bilateral upper extremity supported Standing balance-Leahy Scale: Poor                               Pertinent Vitals/Pain Pain Assessment: No/denies pain    Home Living Family/patient expects to be discharged to:: Private residence Living Arrangements: Other relatives(nephew-works) Available Help at Discharge: Family;Available PRN/intermittently;Personal care attendant(4 hours, 5 days a week) Type of Home: House Home Access: Ramped entrance     Home Layout: One level Home Equipment: Cane - single point;Walker - 2 wheels;Bedside commode;Shower seat      Prior Function Level of Independence: Independent with assistive device(s);Needs assistance   Gait / Transfers Assistance Needed: uses cane for ambulation  ADL's / Homemaking Assistance Needed: aide assists with bathing, dressing, household tasks  Comments: uses cane for ambulation.     Hand Dominance        Extremity/Trunk Assessment   Upper Extremity Assessment Upper Extremity Assessment: Defer to OT evaluation    Lower Extremity Assessment Lower Extremity Assessment: Generalized weakness    Cervical / Trunk Assessment Cervical / Trunk Assessment: Kyphotic  Communication   Communication: No difficulties  Cognition Arousal/Alertness: Awake/alert Behavior During Therapy: WFL for tasks assessed/performed Overall Cognitive Status: Within Functional Limits for tasks assessed                                        General Comments      Exercises  Assessment/Plan    PT Assessment Patient needs continued PT services  PT Problem List Decreased strength;Decreased mobility;Decreased balance;Decreased activity tolerance;Decreased knowledge of use of DME       PT Treatment Interventions      PT Goals (Current goals can be found in the Care Plan section)  Acute Rehab PT Goals Patient Stated Goal: to get better and get home PT Goal Formulation: With patient Time For Goal  Achievement: 02/15/20 Potential to Achieve Goals: Good    Frequency Min 3X/week   Barriers to discharge        Co-evaluation               AM-PAC PT "6 Clicks" Mobility  Outcome Measure Help needed turning from your back to your side while in a flat bed without using bedrails?: A Little Help needed moving from lying on your back to sitting on the side of a flat bed without using bedrails?: A Little Help needed moving to and from a bed to a chair (including a wheelchair)?: A Little Help needed standing up from a chair using your arms (e.g., wheelchair or bedside chair)?: A Little Help needed to walk in hospital room?: A Little Help needed climbing 3-5 steps with a railing? : A Little 6 Click Score: 18    End of Session Equipment Utilized During Treatment: Gait belt Activity Tolerance: Patient tolerated treatment well Patient left: in chair;with call bell/phone within reach;with chair alarm set   PT Visit Diagnosis: Muscle weakness (generalized) (M62.81)    Time: 2518-9842 PT Time Calculation (min) (ACUTE ONLY): 24 min   Charges:   PT Evaluation $PT Eval Low Complexity: 1 Low PT Treatments $Gait Training: 8-22 mins          Doreatha Massed, PT Acute Rehabilitation

## 2020-02-01 NOTE — Progress Notes (Signed)
C/O of constipation.Marland KitchenMarland KitchenMiralax with warm apple juice given. SRP, RN

## 2020-02-02 ENCOUNTER — Institutional Professional Consult (permissible substitution): Payer: Medicare Other | Admitting: Pulmonary Disease

## 2020-02-02 ENCOUNTER — Ambulatory Visit: Payer: Medicare Other | Admitting: Pulmonary Disease

## 2020-02-02 DIAGNOSIS — I5033 Acute on chronic diastolic (congestive) heart failure: Secondary | ICD-10-CM

## 2020-02-02 LAB — BASIC METABOLIC PANEL
Anion gap: 10 (ref 5–15)
BUN: 47 mg/dL — ABNORMAL HIGH (ref 8–23)
CO2: 34 mmol/L — ABNORMAL HIGH (ref 22–32)
Calcium: 8.8 mg/dL — ABNORMAL LOW (ref 8.9–10.3)
Chloride: 89 mmol/L — ABNORMAL LOW (ref 98–111)
Creatinine, Ser: 1.63 mg/dL — ABNORMAL HIGH (ref 0.44–1.00)
GFR calc Af Amer: 33 mL/min — ABNORMAL LOW (ref >60)
GFR calc non Af Amer: 28 mL/min — ABNORMAL LOW (ref >60)
Glucose, Bld: 90 mg/dL (ref 70–99)
Potassium: 5.4 mmol/L — ABNORMAL HIGH (ref 3.5–5.1)
Sodium: 133 mmol/L — ABNORMAL LOW (ref 135–145)

## 2020-02-02 LAB — PROTIME-INR
INR: 2.4 — ABNORMAL HIGH (ref 0.8–1.2)
Prothrombin Time: 25.1 seconds — ABNORMAL HIGH (ref 11.4–15.2)

## 2020-02-02 LAB — POTASSIUM: Potassium: 4.5 mmol/L (ref 3.5–5.1)

## 2020-02-02 MED ORDER — WARFARIN SODIUM 5 MG PO TABS
10.0000 mg | ORAL_TABLET | Freq: Once | ORAL | Status: AC
  Administered 2020-02-02: 10 mg via ORAL
  Filled 2020-02-02: qty 2

## 2020-02-02 NOTE — Consult Note (Addendum)
Cardiology Consultation:   Patient ID: [name] [name] MRN: [id number]; DOB: July 03, 1932  Admit date: 01/30/2020 Date of Consult: 02/02/2020  Primary Care Provider: Fayrene Helper, MD Primary Cardiologist: Sanda Klein, MD  Primary Electrophysiologist:  None    Patient Profile:   [name] [name] is a 86 y.o. 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 Louanne Belton.  History of Present Illness:   [name] [name] is a 86 year old female with the above history who is followed by Dr. Sallyanne Kuster and Dr. Lovena Le. She has a history aortic and mitral valve replacement with mechanical protheses. AVR performed in 2001 and MVR in 2004. St. Jude prostheses were used for both and both surgeries were performed in [place]. She had normal coronaries prior to her first surgery. She is now pacemaker dependent due to complete heart block following valve replacements. In 01/2015, her initially PPM lead was extracted and she received a CRT-D Medtronic Viva XT device. However, in 01/2019, she had a BiV PPM implanted programmed for VVIR. She has a history of severe cardiomyopathy with an EF as low as 25%. EF had improved to 55% on Echo in 02/2019.   Patient presented to the ED on 01/26/2020 with generalized weakness and abdominal pain. High-sensitivity troponin minimally elevated and flat at 33 >> 37 (consistent with baseline). BNP elevated at 489. Other lab work unremarkable. PPM was interrogatted and there was concern that LV threshold was  elevated. Medtronic tech came and adjusted device and patient was discharged with instruction to follow-up with Cardiology.  Patient presented [facility] ED on 01/30/2020 with worsening shortness of breath. In the ED, patient tachypneic and hypertensive. EKG showed V-paced rhythm with possible underlying atrial flutter. High-sensitivity troponin minimally elevated and flat at 31 >> 31. BNP elevated at 778. Chest x-ray showed chronic interstitial thickening and prominent pulmonary arteries suggesting pulmonary hypertension but no acute findings. WBC 6.3, Hgb 8.8, Plts 169. Na 133, K 5.1, Glucose 101, BUN 49, Cr 1.55. COVID-19 negative. ABG showed pH 7.404, PCO2 47, PO2 61, HCO3 28.7, and O2 sat 89.4. Patient started on IV Lasix and admitted for further evaluation. Repeat Echo this admission showed drop in LVEF of 30-35% with global hypokinesis and severely elevated PASP of 71.2 mmHg. Cardiology consulted today for assistance with CHF given drop in EF.  At the time of this evaluation, patient resting comfortably on 2L via nasal cannula (which she is on chronically at home). Patient reports a little worsening shortness of breath at rest prior to admission but states it was mainly worsening fatigue and weakness that brought her to the ED again. She notes stable 2 pillow orthopnea, mild pedal edema that resolves with elevation of legs, and 3-4 lb weight of the last couple of days prior to presentation. No PND. No chest pain. She notes occasional feeling like her heart is racing as well as some mild lightheadedness/dizziness if she stands up too quickly but denies any falls or syncope. She notes some nausea prior to admission but no vomiting. She also reported  a productive cough and some nasal congestion but no recent fevers, body aches, or chills. No abnormal bleeding in urine or stools.   Patient states she feels much better after being diuresed with IV Lasix and feels almost back to baseline.  Past  Medical History:  Diagnosis Date  . Acute on chronic diastolic CHF (congestive heart failure) ([place]) 05/19/2012  . Adenomatous polyp of colon 12/16/2002   Dr. [name]/[facility]  . Allergy   . Calculus of gallbladder with chronic cholecystitis without obstruction 02/09/2009   Qualifier: Diagnosis of  By: [name], [name]    . Cataract   . CHB (complete heart block) ([place]) 10/07/2014      . CHF (congestive heart failure) ([place])    a.  EF previously reduced to 25-30% b. EF improved to 60-65% by echo in 10/2017.   Marland Kitchen Chronic kidney disease, stage I 2006  . Constipation       . COPD (chronic obstructive pulmonary disease) ([place])       . DJD (degenerative joint disease) of lumbar spine   . DM (diabetes mellitus) ([place]) 2006  . Esophageal reflux   . Glaucoma   . Gout       . H/O adenomatous polyp of colon 05/24/2012   2004 in [place] colonoscopy 2008 normal    . H/O heart valve replacement with mechanical valve    a. s/p mechanical AVR in 2001 and mechanical MVR in 2004.  Marland Kitchen H/O wisdom tooth extraction   . HIP PAIN, RIGHT 04/23/2009   Qualifier: Diagnosis of  By: [name], [name]    . Hyperlipemia   . IDA (iron deficiency anemia)    Parenteral iron/[name]  . Insomnia       . Nausea without vomiting 06/07/2010   Qualifier: Diagnosis of  By: [name], [name]    . Non-ischemic cardiomyopathy ([place])    a. s/p MDT CRTD  . Obesity, unspecified   . Other and unspecified hyperlipidemia   . Oxygen deficiency 2014   nocturnal;  . Papilloma of breast 03/06/2012   This was diagnosed as a left breast papilloma by ultrasound characteristics and symptoms in 2010. Because of possible medical risk factors no surgical intervention has been done and it did doing annual followups. The area has been stable by physical examination and mammograms and ultrasound since 2010.   Marland Kitchen Spinal stenosis of lumbar region 02/19/2013  . Spondylolisthesis   . Spondylosis     . Thyroid cancer ([place])    remote thyroidectomy, no recurrence, pt denies in 2016 thyroid cancer  . Type 2 diabetes mellitus with hyperglycemia ([place]) 07/15/2010  . Unspecified hypothyroidism       . 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 Vertigo         Past Surgical History:  Procedure Laterality Date  . AORTIC AND MITRAL VALVE REPLACEMENT  2001  . BI-VENTRICULAR IMPLANTABLE CARDIOVERTER DEFIBRILLATOR UPGRADE N/A 01/18/2015   Procedure: BI-VENTRICULAR IMPLANTABLE CARDIOVERTER DEFIBRILLATOR UPGRADE;  Surgeon: Evans Lance, MD;  Location: [facility] CATH LAB;  Service: Cardiovascular;  Laterality: N/A;  . BIV ICD GENERATOR CHANGEOUT N/A 01/30/2019   Procedure: BIV ICD GENERATOR CHANGEOUT;  Surgeon: Evans Lance, MD;  Location: [facility] CV LAB;  Service: Cardiovascular;  Laterality: N/A;  . CARDIAC CATHETERIZATION    . CARDIAC VALVE REPLACEMENT    . CARDIOVERSION N/A 11/13/2016   Procedure: CARDIOVERSION;  Surgeon: Sanda Klein, MD;  Location: [facility] ENDOSCOPY;  Service: Cardiovascular;  Laterality: N/A;  . COLONOSCOPY  06/12/2007   Rourk- Normal rectum/Normal colon  . COLONOSCOPY  12/16/02   small hemorrhoids  . COLONOSCOPY  06/20/2012   Procedure: COLONOSCOPY;  Surgeon: Daneil Dolin, MD;  Location: [facility] ENDO SUITE;  Service: Endoscopy;  Laterality: N/A;  10:45  . defibrillator implanted 2006    . DOPPLER ECHOCARDIOGRAPHY  2012  . DOPPLER ECHOCARDIOGRAPHY  05,06,07,08,09,2011  . ESOPHAGOGASTRODUODENOSCOPY  06/12/2007   Rourk- normal esophagus, small hiatal hernia, otherwise normal stomach, D1, D2  . EYE SURGERY Right 2005  . EYE SURGERY Left 2006  . ICD LEAD REMOVAL Left 02/08/2015   Procedure: ICD LEAD REMOVAL;  Surgeon: Evans Lance, MD;  Location: [facility] OR;  Service: Cardiovascular;  Laterality: Left;  "[name] plan extraction and insertion of a BiV PM"  **Dr. Roxan Hockey backing up case**  . IMPLANTABLE CARDIOVERTER DEFIBRILLATOR (ICD) GENERATOR CHANGE Left  02/08/2015   Procedure: ICD GENERATOR CHANGE;  Surgeon: Evans Lance, MD;  Location: [facility];  Service: Cardiovascular;  Laterality: Left;  . INSERT / REPLACE / REMOVE PACEMAKER    . PACEMAKER INSERTION  May 2016  . pacemaker placed  2004  . right breast cyst removed     benign   . right cataract removed     2005  . TEE WITHOUT CARDIOVERSION N/A 11/13/2016   Procedure: TRANSESOPHAGEAL ECHOCARDIOGRAM (TEE);  Surgeon: Sanda Klein, MD;  Location: [facility] ENDOSCOPY;  Service: Cardiovascular;  Laterality: N/A;  . THYROIDECTOMY       Home Medications:  Prior to Admission medications   Medication Sig Start Date End Date Taking? Authorizing Provider  acetaminophen (TYLENOL) 500 MG tablet Take 500 mg by mouth every 6 (six) hours as needed (pain).   Yes [provider]  albuterol (PROVENTIL) (2.5 MG/3ML) 0.083% nebulizer solution 90INHALE 1 VIAL VIA NEBULIZER THREE TIMES DAILY. Patient taking differently: Take 2.5 mg by nebulization in the morning and at bedtime. *May use additional one time if needed for shortness of breath 01/12/20  Yes Fayrene Helper, MD  albuterol (VENTOLIN HFA) 108 (90 Base) MCG/ACT inhaler INHALE 2 PUFFS INTO THE LUNGS EVERY 6 HOURS AS NEEDED FOR WHEEZING OR SHORTNESS OF BREATH Patient taking differently: Inhale 1-2 puffs into the lungs every 6 (six) hours as needed for wheezing or shortness of breath.  08/25/19  Yes Fayrene Helper, MD  allopurinol (ZYLOPRIM) 100 MG tablet TAKE 1 TABLET(100 MG) BY MOUTH DAILY Patient taking differently: Take 100 mg by mouth in the morning.  12/30/19  Yes Fayrene Helper, MD  BREO ELLIPTA 100-25 MCG/INH AEPB 1 TAKE 1 INHALATION BY MOUTH ONCE DAILY Patient taking differently: Inhale 1 puff into the lungs in the morning.  08/25/19  Yes Fayrene Helper, MD  cholecalciferol (VITAMIN D3) 25 MCG (1000 UT) tablet Take 1,000 Units by mouth in the morning.    Yes [provider]  docusate sodium (COLACE) 100 MG capsule Take  300 mg by mouth at bedtime.    Yes [provider]  fluticasone (FLONASE) 50 MCG/ACT nasal spray SHAKE LIQUID AND USE 2 SPRAYS IN EACH NOSTRIL DAILY Patient taking differently: Place 2 sprays into both nostrils every morning.  11/24/19  Yes Fayrene Helper, MD  folic acid (FOLVITE) 1 MG tablet TAKE 1 TABLET(1 MG) BY MOUTH DAILY Patient taking differently: Take 1 mg by mouth every evening.  11/10/19  Yes Fayrene Helper, MD  furosemide (LASIX) 40 MG tablet Take 1 tablet (40 mg total) by  mouth daily. Patient taking differently: Take 40 mg by mouth every morning.  08/19/19  Yes Fayrene Helper, MD  gabapentin (NEURONTIN) 300 MG capsule Take 1 capsule (300 mg total) by mouth 2 (two) times daily. Patient taking differently: Take 300 mg by mouth at bedtime.  05/31/19  Yes Barton Dubois, MD  hydrALAZINE (APRESOLINE) 25 MG tablet Take 1 tablet (25 mg total) by mouth 2 (two) times daily. 05/19/19  Yes Croitoru, Mihai, MD  isosorbide mononitrate (IMDUR) 30 MG 24 hr tablet Take 1 tablet (30 mg total) by mouth daily. Patient taking differently: Take 15 mg by mouth every morning.  12/29/19 03/28/20 Yes Croitoru, Mihai, MD  Liniments (SALONPAS PAIN RELIEF PATCH EX) Apply 1 patch topically daily as needed (pain).   Yes [provider]  meclizine (ANTIVERT) 12.5 MG tablet Take 1 tablet (12.5 mg total) by mouth 3 (three) times daily as needed for dizziness. 09/02/19  Yes Fayrene Helper, MD  metolazone (ZAROXOLYN) 2.5 MG tablet Take one tablet on Saturdays. Patient taking differently: Take 2.5 mg by mouth every Saturday.  11/24/19  Yes Croitoru, Mihai, MD  metoprolol succinate (TOPROL-XL) 100 MG 24 hr tablet TAKE 1 TABLET BY MOUTH EVERY DAY WITH FOOD OR MILK Patient taking differently: Take 100 mg by mouth every evening.  11/17/19  Yes Fayrene Helper, MD  Multiple Vitamin (MULTIVITAMIN) LIQD Take 5 mLs by mouth daily.   Yes [provider]  nitroGLYCERIN (NITROSTAT) 0.4 MG SL  tablet Place 1 tablet (0.4 mg total) under the tongue every 5 (five) minutes as needed for chest pain. 08/03/19  Yes Rolland Porter, MD  ondansetron (ZOFRAN ODT) 4 MG disintegrating tablet Take 1 tablet (4 mg total) by mouth every 8 (eight) hours as needed for nausea or vomiting. 01/26/20  Yes Fawze, Mina A, PA-C  ondansetron (ZOFRAN) 4 MG tablet Take 4 mg by mouth every 8 (eight) hours as needed for nausea or vomiting.   Yes [provider]  Polyethyl [name] (SYSTANE) 0.4-0.3 % GEL ophthalmic gel Place 1 application into the left eye at bedtime.   Yes [provider]  polyethylene glycol powder (GLYCOLAX/MIRALAX) 17 GM/SCOOP powder Take 17 g by mouth daily as needed for mild constipation or moderate constipation.   Yes [provider]  potassium chloride SA (KLOR-CON) 20 MEQ tablet TAKE 1 TABLET(20 MEQ) BY MOUTH DAILY Patient taking differently: Take 20 mEq by mouth daily.  01/28/20  Yes Fayrene Helper, MD  pravastatin (PRAVACHOL) 40 MG tablet TAKE 1 TABLET BY MOUTH EVERY EVENING Patient taking differently: Take 40 mg by mouth every evening.  11/10/19  Yes Fayrene Helper, MD  SYNTHROID 75 MCG tablet TAKE 1 TABLET BY MOUTH DAILY Patient taking differently: Take 75 mcg by mouth daily before breakfast.  10/27/19  Yes Fayrene Helper, MD  warfarin (JANTOVEN) 5 MG tablet TAKE 2 TABLETS EVERY DAY EXCEPT 1 TABLET ON SATURDAY Patient taking differently: Take 5-10 mg by mouth See admin instructions. TAKE 2 TABLETS EVERY DAY EXCEPT 1 TABLET ON Saturday @ 8:30PM 11/10/19  Yes Evans Lance, MD    Inpatient Medications: Scheduled Meds: . albuterol  2.5 mg Nebulization BID  . allopurinol  100 mg Oral Daily  . cholecalciferol  1,000 Units Oral q AM  . docusate sodium  300 mg Oral QHS  . fluticasone  2 spray Each Nare q morning - 10a  . fluticasone furoate-vilanterol  1 puff Inhalation q AM  . folic acid  1 mg  Oral QPM  . furosemide  40 mg Intravenous Q12H  .  gabapentin  300 mg Oral QHS  . hydrALAZINE  25 mg Oral BID  . isosorbide mononitrate  15 mg Oral q morning - 10a  . levothyroxine  75 mcg Oral QAC breakfast  . metolazone  2.5 mg Oral Q Sat  . metoprolol succinate  100 mg Oral QPM  . multivitamin with minerals  1 tablet Oral Daily  . pravastatin  40 mg Oral QPM  . sodium chloride flush  3 mL Intravenous Q12H  . Warfarin - Pharmacist Dosing Inpatient   Does not apply q1600   Continuous Infusions: . sodium chloride     PRN Meds: sodium chloride, acetaminophen, lidocaine, nitroGLYCERIN, ondansetron (ZOFRAN) IV, polyethylene glycol, sodium chloride flush  Allergies:    Allergies  Allergen Reactions  . Penicillins Other (See Comments)    Bruise Did it involve swelling of the face/tongue/throat, SOB, or low BP? No Did it involve sudden or severe rash/hives, skin peeling, or any reaction on the inside of your mouth or nose? No Did you need to seek medical attention at a hospital or doctor's office? No When did it last happen?20+ years If all above answers are "NO", may proceed with cephalosporin use.   . Aspirin Other (See Comments)    On coumadin  . Fentanyl Dermatitis    Rash with patch   . Niacin Itching    Social History:   Social History   Socioeconomic History  . Marital status: Widowed    Spouse name: Not on file  . Number of children: 0  . Years of education: Not on file  . Highest education level: Not on file  Occupational History  . Occupation: retired    Fish farm manager: RETIRED  Tobacco Use  . Smoking status: Former Smoker    Packs/day: 0.75    Years: 40.00    Pack years: 30.00    Types: Cigarettes    Quit date: 03/08/1987    Years since quitting: 32.9  . Smokeless tobacco: Never Used  Substance and Sexual Activity  . Alcohol use: No    Alcohol/week: 0.0 standard drinks  . Drug use: No  . Sexual activity: Not Currently  Other Topics Concern  . Not on file  Social History Narrative   From [place]  (2004)   Social Determinants of Health   Financial Resource Strain: Medium Risk  . Difficulty of Paying Living Expenses: Somewhat hard  Food Insecurity: No Food Insecurity  . Worried About Charity fundraiser in the Last Year: Never true  . Ran Out of Food in the Last Year: Never true  Transportation Needs: No Transportation Needs  . Lack of Transportation (Medical): No  . Lack of Transportation (Non-Medical): No  Physical Activity: Insufficiently Active  . Days of Exercise per Week: 2 days  . Minutes of Exercise per Session: 20 min  Stress: No Stress Concern Present  . Feeling of Stress : Not at all  Social Connections: Somewhat Isolated  . Frequency of Communication with Friends and Family: More than three times a week  . Frequency of Social Gatherings with Friends and Family: Never  . Attends Religious Services: More than 4 times per year  . Active Member of Clubs or Organizations: No  . Attends Archivist Meetings: Never  . Marital Status: Widowed  Intimate Partner Violence: Not At Risk  . Fear of Current or Ex-Partner: No  . Emotionally Abused: No  . Physically Abused: No  .  Sexually Abused: No    Family History:    Family History  Problem Relation Age of Onset  . Pancreatic cancer Mother   . Heart disease Father   . Heart disease Sister   . Heart attack Sister   . Heart disease Brother   . Diabetes Brother   . Heart disease Brother   . Diabetes Brother   . Hypertension Brother   . Lung cancer Brother      ROS:  Please see the history of present illness.  All other ROS reviewed and negative.     Physical Exam/Data:   Vitals:   02/01/20 2011 02/01/20 2014 02/02/20 0500 02/02/20 0516  BP: (!) 145/66   (!) 144/78  Pulse: 60   (!) 59  Resp: 18   18  Temp: 98.1 F (36.7 C)   98.3 F (36.8 C)  TempSrc:      SpO2: 98% 98%  95%  Weight:   82.2 kg   Height:        Intake/Output Summary (Last 24 hours) at 02/02/2020 0827 Last data filed at  02/02/2020 0533 Gross per 24 hour  Intake 720 ml  Output 1800 ml  Net -1080 ml   Last 3 Weights 02/02/2020 02/01/2020 01/31/2020  Weight (lbs) 181 lb 3.5 oz 185 lb 10 oz 192 lb 14.4 oz  Weight (kg) 82.2 kg 84.2 kg 87.5 kg     Body mass index is 28.38 kg/m.  General: 86 y.o. female resting comfortably in no acute distress. HEENT: Normocephalic and atraumatic. Sclera clear.  Neck: Supple. No carotid bruits. No JVD. Heart: RRR. Distinct S1 and S2. No murmurs, gallops, or rubs. Mechanical click noted. Radial and distal pedal pulses 2+ and equal bilaterally. Lungs: On 2L via nasal cannula. No increased work of breathing. Clear to ausculation bilaterally. No wheezes, rhonchi, or rales.  Abdomen: Soft, non-distended, and non-tender to palpation. Bowel sounds present. Extremities: No to trace lower extremity edema.    Skin: Warm and dry. Neuro: Alert and oriented x3. No focal deficits. Psych: Normal affect. Responds appropriately.  EKG:  The EKG was personally reviewed and demonstrates:  V-paced rhythm with what looks like underlying atrial flutter. Rates in the 60's.   Telemetry:  Telemetry was personally reviewed and demonstrates: V-paced. Rates in the 60's to 70's.  Relevant CV Studies:  Echocardiogram 01/31/2020: Impressions: 1. Left ventricular ejection fraction, by estimation, is 30 to 35%. The  left ventricle has moderately decreased function. The left ventricle  demonstrates global hypokinesis. There is mild concentric left ventricular  hypertrophy. Left ventricular  diastolic function could not be evaluated.  2. Right ventricular systolic function is normal. The right ventricular  size is normal. There is severely elevated pulmonary artery systolic  pressure. The estimated right ventricular systolic pressure is 70.6 mmHg.  3. Left atrial size was severely dilated.  4. Right atrial size was moderately dilated.  5. The mitral valve has been repaired/replaced. No evidence of mitral   valve regurgitation.  6. The aortic valve has been repaired/replaced. Aortic valve  regurgitation is not visualized.  Laboratory Data:  High Sensitivity Troponin:   Recent Labs  Lab 01/26/20 1918 01/26/20 2113 01/30/20 2340 01/31/20 0132  TROPONINIHS 33* 37* 31* 31*     Chemistry Recent Labs  Lab 01/30/20 2340 01/30/20 2340 01/30/20 2356 02/01/20 0515 02/02/20 0508  NA 136   < > 136 137 133*  K 4.8   < > 4.7 4.5 5.4*  CL 99   < >  97* 93* 89*  CO2 28  --   --  34* 34*  GLUCOSE 104*   < > 101* 84 90  BUN 35*   < > 33* 36* 47*  CREATININE 1.41*   < > 1.40* 1.53* 1.63*  CALCIUM 8.9  --   --  9.2 8.8*  GFRNONAA 34*  --   --  31* 28*  GFRAA 39*  --   --  36* 33*  ANIONGAP 9  --   --  10 10   < > = values in this interval not displayed.    Recent Labs  Lab 01/26/20 1918 01/30/20 2340  PROT 7.4 7.7  ALBUMIN 3.7 3.8  AST 25 25  ALT 17 16  ALKPHOS 48 53  BILITOT 0.7 0.3   Hematology Recent Labs  Lab 01/26/20 1918 01/26/20 1918 01/30/20 2340 01/30/20 2356 02/01/20 0515  WBC 6.3  --  6.9  --  6.0  RBC 2.98*  --  3.08*  --  3.30*  HGB 8.8*   < > 9.1* 10.2* 9.7*  HCT 28.9*   < > 30.1* 30.0* 31.7*  MCV 97.0  --  97.7  --  96.1  MCH 29.5  --  29.5  --  29.4  MCHC 30.4  --  30.2  --  30.6  RDW 16.5*  --  17.1*  --  16.9*  PLT 169  --  182  --  181   < > = values in this interval not displayed.   BNP Recent Labs  Lab 01/26/20 1918 01/30/20 2340  BNP 489.0* 778.2*    DDimer No results for input(s): DDIMER in the last 168 hours.   Radiology/Studies:  DG Chest Port 1 View  Result Date: 01/30/2020 CLINICAL DATA:  Patient presents with complaints of shortness of breath that have not gotten better since being seen at [facility] a few days ago. Patient states worsening exertional SOB. Patient is on 2L [place] at home and her sats have been 95%+ on her O2. Patient states she has not had to increase O2 demand. Patient hx COPD. EXAM: PORTABLE CHEST 1 VIEW COMPARISON:   01/02/2020 FINDINGS: Stable changes from prior cardiac surgery and valve replacement. Cardiac silhouette is mildly enlarged. Enlarged pulmonary arteries. No mediastinal or hilar masses. Left anterior chest wall biventricular cardioverter-defibrillator is stable. Lungs demonstrate interstitial thickening similar to the prior study. No lung consolidation. No convincing pulmonary edema. No pleural effusion pneumothorax. Skeletal structures are demineralized but grossly intact. IMPRESSION: 1. No acute cardiopulmonary disease. 2. Cardiomegaly, changes from cardiac surgery and valve replacement, chronic interstitial thickening and prominent pulmonary arteries suggesting pulmonary hypertension, stable from the prior radiographs. Electronically Signed   By: Lajean Manes M.D.   On: 01/30/2020 23:48   ECHOCARDIOGRAM COMPLETE  Result Date: 01/31/2020    ECHOCARDIOGRAM REPORT   Patient Name:   [name] [name] Date of Exam: 01/31/2020 Medical Rec #:  [id number]      Height:       67.0 in Accession #:    [id number]     Weight:       193.0 lb Date of Birth:  [birth year]      BSA:          1.992 m Patient Age:    59 years       BP:           174/74 mmHg Patient Gender: F              HR:  102 bpm. Exam Location:  Inpatient Procedure: 2D Echo, Cardiac Doppler and Color Doppler Indications:    I50.40* Unspecified combined systolic (congestive) and diastolic                 (congestive) heart failure  History:        Patient has prior history of Echocardiogram examinations, most                 recent 03/18/2019. Cardiomyopathy and CHF, Abnormal ECG, COPD;                 Risk Factors:Hypertension. St Jude AVR. Mitral valve                 replacement. Covid 19 2/21. Hypoxia.  Sonographer:    Roseanna Rainbow RDCS Referring Phys: [place]  1. Left ventricular ejection fraction, by estimation, is 30 to 35%. The left ventricle has moderately decreased function. The left ventricle demonstrates global hypokinesis.  There is mild concentric left ventricular hypertrophy. Left ventricular diastolic function could not be evaluated.  2. Right ventricular systolic function is normal. The right ventricular size is normal. There is severely elevated pulmonary artery systolic pressure. The estimated right ventricular systolic pressure is 96.7 mmHg.  3. Left atrial size was severely dilated.  4. Right atrial size was moderately dilated.  5. The mitral valve has been repaired/replaced. No evidence of mitral valve regurgitation.  6. The aortic valve has been repaired/replaced. Aortic valve regurgitation is not visualized. FINDINGS  Left Ventricle: Left ventricular ejection fraction, by estimation, is 30 to 35%. The left ventricle has moderately decreased function. The left ventricle demonstrates global hypokinesis. The left ventricular internal cavity size was normal in size. There is mild concentric left ventricular hypertrophy. Left ventricular diastolic function could not be evaluated due to atrial fibrillation. Left ventricular diastolic function could not be evaluated. Right Ventricle: The right ventricular size is normal. No increase in right ventricular wall thickness. Right ventricular systolic function is normal. There is severely elevated pulmonary artery systolic pressure. The tricuspid regurgitant velocity is 3.75 m/s, and with an assumed right atrial pressure of 15 mmHg, the estimated right ventricular systolic pressure is 89.3 mmHg. Left Atrium: Left atrial size was severely dilated. Right Atrium: Right atrial size was moderately dilated. Pericardium: There is no evidence of pericardial effusion. Mitral Valve: The mitral valve has been repaired/replaced. No evidence of mitral valve regurgitation. There is a mechanical valve present in the mitral position. MV peak gradient, 16.2 mmHg. The mean mitral valve gradient is 5.0 mmHg. Tricuspid Valve: The tricuspid valve is grossly normal. Tricuspid valve regurgitation is mild.  Aortic Valve: The aortic valve has been repaired/replaced. Aortic valve regurgitation is not visualized. Aortic valve mean gradient measures 9.0 mmHg. Aortic valve peak gradient measures 23.0 mmHg. Aortic valve area, by VTI measures 1.98 cm. There is a bileaflet valve present in the aortic position. Pulmonic Valve: The pulmonic valve was grossly normal. Pulmonic valve regurgitation is not visualized. No evidence of pulmonic stenosis. Aorta: The aortic root and ascending aorta are structurally normal, with no evidence of dilitation. IAS/Shunts: The atrial septum is grossly normal. Additional Comments: A pacer wire is visualized.  LEFT VENTRICLE PLAX 2D LVIDd:         4.90 cm      Diastology LV PW:         1.58 cm      LV e' lateral:   5.77 cm/s LV IVS:  1.29 cm      LV E/e' lateral: 32.3 LVOT diam:     1.90 cm      LV e' medial:    5.22 cm/s LV SV:         78           LV E/e' medial:  35.7 LV SV Index:   39 LVOT Area:     2.84 cm  LV Volumes (MOD) LV vol d, MOD A2C: 88.9 ml LV vol d, MOD A4C: 101.0 ml LV vol s, MOD A2C: 35.6 ml LV vol s, MOD A4C: 56.6 ml LV SV MOD A2C:     53.3 ml LV SV MOD A4C:     101.0 ml LV SV MOD BP:      54.3 ml RIGHT VENTRICLE         IVC TAPSE (M-mode): 1.7 cm  IVC diam: 2.80 cm LEFT ATRIUM             Index       RIGHT ATRIUM           Index LA diam:        4.35 cm 2.18 cm/m  RA Area:     26.00 cm LA Vol (A2C):   88.9 ml 44.63 ml/m RA Volume:   92.90 ml  46.64 ml/m LA Vol (A4C):   94.9 ml 47.64 ml/m LA Biplane Vol: 95.5 ml 47.95 ml/m  AORTIC VALVE AV Area (Vmax):    1.90 cm AV Area (Vmean):   1.88 cm AV Area (VTI):     1.98 cm AV Vmax:           240.00 cm/s AV Vmean:          133.000 cm/s AV VTI:            0.393 m AV Peak Grad:      23.0 mmHg AV Mean Grad:      9.0 mmHg LVOT Vmax:         161.00 cm/s LVOT Vmean:        88.100 cm/s LVOT VTI:          0.275 m LVOT/AV VTI ratio: 0.70  AORTA Ao Root diam: 2.80 cm Ao Asc diam:  3.80 cm MITRAL VALVE                TRICUSPID  VALVE MV Area (PHT): 4.13 cm     TR Peak grad:   56.2 mmHg MV Peak grad:  16.2 mmHg    TR Vmax:        375.00 cm/s MV Mean grad:  5.0 mmHg MV Vmax:       2.02 m/s     SHUNTS MV Vmean:      98.1 cm/s    Systemic VTI:  0.28 m MV Decel Time: 184 msec     Systemic Diam: 1.90 cm MV E velocity: 186.25 cm/s MV A velocity: 99.15 cm/s MV E/A ratio:  1.88 Mertie Moores MD Electronically signed by Mertie Moores MD Signature Date/Time: 01/31/2020/3:34:43 PM    Final     Assessment and Plan:   Acute on Chronic Combined CHF - Patient presented with worsening shortness of breath.  - BNP elevated at 778.  - Chest x-ray showed no edema but was suggestive of pulmonary hypertension.  - Echo showed LVEF of 30-35% with global hypokinesis as well as biatrial enlargement and severely elevated PASP. RV size and function normal. - Currently on IV Lasix 40mg  twice daily. Documented  urinary output of 1.8 L in the past 24 hours and net negative 6 L since admission. Weight down 11 lbs from discharge. Slight increased in BUN and creatinine.  - Patient looks euvolemic on exam. Given rise in creatinine and BUN, I think we can stop IV Lasix and restart PO Lasix tomorrow. [name] go ahead and hold evening dose of IV Lasix.  - No ACEi/ARB/ARNI due to renal function. - Continue Toprol-XL 100mg  daily.  - Continue Hydralazine 25mg  daily and Imdur 15mg  daily. - Continue to monitor daily weight, strict I/O's, and renal function.  Persistent Atrial Fibrillation/ Possible Atrial Flutter - EKG/Telemetry shows ventricular paced rhythm with possible underlying atrial flutter.  - Continue Toprol-XL 100mg  daily.  - Continue chronic anticoagulation with Coumadin. Dosing per pharmacy. INR 2.4 today.  Complete Heart Block s/p BiV PPM - [name] have device interrogated.   Mechanical Aortic and Mitral Valve Replacements - AVR in 2011 and MVR in 2004 in [place]. Both with mechanical prothesis. - Normal function of both valves on Echo this admission.    - Continue Coumadin per pharmacy. INR goal 2.5 to 3.5.  Hypertension - BP mildly elevated at 144/78.  - Continue current medications.   Hyperlipidemia - Recent lipid panel from 01/05/2020: Total Cholesterol 124, Triglycerides 73, HDL 44, LDL 65.  - Continue Pravastatin 40mg  daily.   Stage III CKD - Creatinine 1.63 and BUN 47 today. Baseline 1.4 to 1.8 and 40's respectively. - Continue to monitor daily BMET.  Hyperkalemia - Potassium 5.4 this morning, up from 4.5 yesterday.  - [name] recheck potassium.   COPD - No wheezing on exam.  - Continue breathing treatments.   For questions or updates, please contact [place] Please consult [email or web address] for contact info under     Signed, Eppie Gibson  02/02/2020 8:27 AM  Patient seen and examined with Sande Rives PAC.  Agree as above, with the following exceptions and changes as noted below.  [name] [name] is a pleasant 86 year old female followed by Dr. Sallyanne Kuster and Dr. Lovena Le with a history of aortic and mitral valve replacements with mechanical prostheses, normal coronary arteries with nonischemic cardiomyopathy.  She has been diuresed this admission, and appears euvolemic now.  She is on her baseline O2 nasal cannula, and feels her lower extremity swelling has improved.  We were called to assist with evaluation of her biventricular pacemaker with underlying complete heart block.  Thresholds on her LV lead suggest intermittent lack of capture with a well-functioning RV lead.  Ejection fraction is now reduced to 30 to 35%.  Gen: NAD, CV: RRR, soft systolic murmur.  Along the right sternal border there is a crisp S2 but soft S1, across the precordium only 1 mechanical valve closure click is heard., Lungs: clear, Abd: soft, Extrem: Warm, well perfused, no edema, Neuro/Psych: alert and oriented x 3, normal mood and affect. All available labs, radiology testing, previous records reviewed.  She now appears euvolemic and has been  well diuresed.  Device interrogation suggests high thresholds in her LV lead with intermittent lack of capture.  In addition her ejection fraction is lower.  This may need outpatient evaluation by EP and Dr. Sallyanne Kuster.  Transition to oral diuretics tomorrow.  Elouise Munroe, MD 02/02/20 6:25 PM

## 2020-02-02 NOTE — Progress Notes (Signed)
Physical Therapy Treatment Patient Details Name: [name] [name] MRN: [id number] DOB: [birth year] Today's Date: 02/02/2020    History of Present Illness 86 yo female admitted with CHF, bil ankle/foot pain. Hx of CHF, A flutter, Afib, pacemaker, CKD, COPD, gout, DM, vertigo    PT Comments    Progressing with mobility. Pt c/o fatigue and weakness on today. O2 98% on 2L [place] during session.    Follow Up Recommendations  Home health PT;Supervision - Intermittent     Equipment Recommendations  None recommended by PT    Recommendations for Other Services       Precautions / Restrictions Precautions Precautions: Fall Precaution Comments: O2 dep @ baseline-2L Restrictions Weight Bearing Restrictions: No    Mobility  Bed Mobility Overal bed mobility: Needs Assistance Bed Mobility: Supine to Sit;Sit to Supine     Supine to sit: Supervision;HOB elevated Sit to supine: Min assist;HOB elevated   General bed mobility comments: Increased time. Assist for LEs onto bed.  Transfers Overall transfer level: Needs assistance Equipment used: Rolling walker (2 wheeled) Transfers: Sit to/from Stand Sit to Stand: Min assist;From elevated surface         General transfer comment: Assist to power up, stabilize. Cues for hand placement. Increased time.  Ambulation/Gait Ambulation/Gait assistance: Min assist Gait Distance (Feet): 45 Feet Assistive device: Rolling walker (2 wheeled) Gait Pattern/deviations: Step-through pattern;Decreased stride length;Trunk flexed     General Gait Details: Assist to stabilize throughout distance. Cues for safety. O2 98% on 2L   Stairs             Wheelchair Mobility    Modified Rankin (Stroke Patients Only)       Balance           Standing balance support: Bilateral upper extremity supported Standing balance-Leahy Scale: Poor                              Cognition Arousal/Alertness: Awake/alert Behavior During  Therapy: WFL for tasks assessed/performed Overall Cognitive Status: Within Functional Limits for tasks assessed                                        Exercises      General Comments        Pertinent Vitals/Pain Pain Assessment: Faces Faces Pain Scale: Hurts a little bit Pain Location: L UE Pain Descriptors / Indicators: Sore;Discomfort Pain Intervention(s): Monitored during session;Repositioned    Home Living                      Prior Function            PT Goals (current goals can now be found in the care plan section) Progress towards PT goals: Progressing toward goals    Frequency    Min 3X/week      PT Plan Current plan remains appropriate    Co-evaluation              AM-PAC PT "6 Clicks" Mobility   Outcome Measure  Help needed turning from your back to your side while in a flat bed without using bedrails?: A Little Help needed moving from lying on your back to sitting on the side of a flat bed without using bedrails?: A Little Help needed moving to and from a bed to a chair (including a wheelchair)?:  A Little Help needed standing up from a chair using your arms (e.g., wheelchair or bedside chair)?: A Little Help needed to walk in hospital room?: A Little Help needed climbing 3-5 steps with a railing? : A Little 6 Click Score: 18    End of Session Equipment Utilized During Treatment: Gait belt Activity Tolerance: Patient tolerated treatment well Patient left: in bed;with call bell/phone within reach;with bed alarm set   PT Visit Diagnosis: Difficulty in walking, not elsewhere classified (R26.2);Muscle weakness (generalized) (M62.81)     Time: 1410-3013 PT Time Calculation (min) (ACUTE ONLY): 18 min  Charges:  $Gait Training: 8-22 mins                         Doreatha Massed, PT Acute Rehabilitation

## 2020-02-02 NOTE — Progress Notes (Signed)
ANTICOAGULATION CONSULT NOTE -  Follow Up Consult  Pharmacy Consult for warfarin  Indication: Mechanical mitral and aortic valve  Allergies  Allergen Reactions  . Penicillins Other (See Comments)    Bruise Did it involve swelling of the face/tongue/throat, SOB, or low BP? No Did it involve sudden or severe rash/hives, skin peeling, or any reaction on the inside of your mouth or nose? No Did you need to seek medical attention at a hospital or doctor's office? No When did it last happen?20+ years If all above answers are "NO", may proceed with cephalosporin use.   . Aspirin Other (See Comments)    On coumadin  . Fentanyl Dermatitis    Rash with patch   . Niacin Itching    Patient Measurements: Height: 5\' 7"  (170.2 cm) Weight: 82.2 kg (181 lb 3.5 oz) IBW/kg (Calculated) : 61.6 Heparin Dosing Weight:   Vital Signs: Temp: 98.3 F (36.8 C) (05/10 0516) BP: 144/78 (05/10 0516) Pulse Rate: 59 (05/10 0516)  Labs: Recent Labs    01/30/20 2340 01/30/20 2340 01/30/20 2356 01/31/20 0132 02/01/20 0515 02/02/20 0508  HGB 9.1*   < > 10.2*  --  9.7*  --   HCT 30.1*  --  30.0*  --  31.7*  --   PLT 182  --   --   --  181  --   LABPROT 24.2*  --   --   --  25.3* 25.1*  INR 2.3*  --   --   --  2.4* 2.4*  CREATININE 1.41*   < > 1.40*  --  1.53* 1.63*  TROPONINIHS 31*  --   --  31*  --   --    < > = values in this interval not displayed.    Estimated Creatinine Clearance: 27.8 mL/min (A) (by C-G formula based on SCr of 1.63 mg/dL (H)).   Medical History: Past Medical History:  Diagnosis Date  . Acute on chronic diastolic CHF (congestive heart failure) ([place]) 05/19/2012  . Adenomatous polyp of colon 12/16/2002   Dr. [name]/[facility]  . Allergy   . Calculus of gallbladder with chronic cholecystitis without obstruction 02/09/2009   Qualifier: Diagnosis of  By: [name], [name]    . Cataract   . CHB (complete heart block) ([place]) 10/07/2014       . CHF (congestive heart failure) ([place])    a.  EF previously reduced to 25-30% b. EF improved to 60-65% by echo in 10/2017.   Marland Kitchen Chronic kidney disease, stage I 2006  . Constipation       . COPD (chronic obstructive pulmonary disease) ([place])       . DJD (degenerative joint disease) of lumbar spine   . DM (diabetes mellitus) ([place]) 2006  . Esophageal reflux   . Glaucoma   . Gout       . H/O adenomatous polyp of colon 05/24/2012   2004 in [place] colonoscopy 2008 normal    . H/O heart valve replacement with mechanical valve    a. s/p mechanical AVR in 2001 and mechanical MVR in 2004.  Marland Kitchen H/O wisdom tooth extraction   . HIP PAIN, RIGHT 04/23/2009   Qualifier: Diagnosis of  By: [name], [name]    . Hyperlipemia   . IDA (iron deficiency anemia)    Parenteral iron/[name]  . Insomnia       . Nausea without vomiting 06/07/2010   Qualifier: Diagnosis of  By: [name],  Margaret    . Non-ischemic cardiomyopathy ([place])    a. s/p MDT CRTD  . Obesity, unspecified   . Other and unspecified hyperlipidemia   . Oxygen deficiency 2014   nocturnal;  . Papilloma of breast 03/06/2012   This was diagnosed as a left breast papilloma by ultrasound characteristics and symptoms in 2010. Because of possible medical risk factors no surgical intervention has been done and it did doing annual followups. The area has been stable by physical examination and mammograms and ultrasound since 2010.   Marland Kitchen Spinal stenosis of lumbar region 02/19/2013  . Spondylolisthesis   . Spondylosis   . Thyroid cancer ([place])    remote thyroidectomy, no recurrence, pt denies in 2016 thyroid cancer  . Type 2 diabetes mellitus with hyperglycemia ([place]) 07/15/2010  . Unspecified hypothyroidism       . 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 Vertigo         Medications:  Scheduled:   Assessment: Pharmacy is consulted to dose warfarin in 86 yo female with PMH of Mechanical mitral and  aortic valve replacement.   Per med rec, pt PTA regimen is warfarin 10 mg PO daily except 5 mg on saturdays. This is confirmed by the most recent visit pt had to anticoagulation clinic on 12/31/2019 as available in Chart Review. At this visit, states that the 5 mg dose is on Tuesdays, but either maintains same total weekly dose. In this same note, it is noted that the target INR for this pt is 2.0 to 2.5 per 04/30/17 INR goal decreased per Dt Taylor's order. Target INR was decreased due to large bruising.   Today, 02/02/20  INR continues to be therapeutic  Hgb, Plts stable  Meals that are charted are charted as 100% eaten. Pt on heart healthy diet   No new interacting medications started    Goal of Therapy:  INR 2.0-2.5 Monitor platelets by anticoagulation protocol: Yes   Plan:   Repeat warfarin 10 mg PO x 1   Daily INR   Monitor oral intake  Monitor for start of any new interacting medication  Monitor for signs and symptoms of bleeding    Adrian Saran, PharmD, BCPS 02/02/2020 9:54 AM

## 2020-02-02 NOTE — Progress Notes (Signed)
No ICM remote transmission received for 02/02/2020 due to hospitalization and next ICM transmission scheduled for 02/09/2020.

## 2020-02-02 NOTE — Progress Notes (Signed)
PROGRESS NOTE  Leonila Speranza BWL:[id number] DOB: 1932/09/24 DOA: 01/30/2020 PCP: Fayrene Helper, MD   LOS: 2 days   Brief narrative: As per HPI,   Belvie Iribe is a 86 year old female, lives with her nephew, ambulates with the help of a cane or a walker, PMH of chronic diastolic CHF, atypical atrial flutter/A. fib, complete heart block-pacemaker dependent, CRT-P, s/p mechanical mitral and aortic valve replacement on Coumadin anticoagulation, prosthetic valve associated hemolysis, essential hypertension, CKD stage IIIb, COPD, chronic hypoxic respiratory failure on home oxygen 2 L/min, GERD and gout, presented to the [facility] ED on 01/30/2020 due to progressive dyspnea, leg swelling and weight gain.  Patient had symptoms for several days with dyspnea at rest and on mild exertion with increasing leg swelling and weight gain.  Due to feeling of weakness, she had cut down her diuretics.  Completed COVID-19 vaccination.  Utilized her home inhalers with minimal improvement.  Since ED, received Lasix 60 mg IV x1 with good urine output and breathing feels better but not yet at baseline.  ED Course: Afebrile, tachypneic, normal heart rate, elevated blood pressures up to systolic in the 681L.  Hypoxic with oxygen saturation in the low 90s on 2 L/min oxygen via nasal cannula.  ABG: pH 7.404, PCO2 47, PO2 61, HCO3 28.7 and oxygen saturation 89.4.  CMP significant for BUN 35, creatinine 1.41.  BNP 778.  Troponin plateaued in the low 30s.  Hemoglobin 9.1.  INR 2.3.  Flu panel and Covid 19 RT-PCR negative.  Chest x-ray personally reviewed, suggestive of pulmonary edema, surgical clips, cardiomegaly, pacemaker.  Assessment/Plan:  Principal Problem:   Acute on chronic diastolic CHF (congestive heart failure) (HCC) Active Problems:   Hypothyroidism, postsurgical   Hyperlipidemia LDL goal <70   Essential hypertension   Biventricular automatic implantable cardioverter defibrillator in situ   CKD  (chronic kidney disease), stage IV (HCC)   Chronic anticoagulation, on coumadin for Mechanical AVR and MVR   COPD (chronic obstructive pulmonary disease) (HCC)   CHB (complete heart block) (HCC)   Persistent atrial fibrillation (HCC)   Presence of cardiac pacemaker   GERD (gastroesophageal reflux disease)   Acute on chronic respiratory failure with hypoxia (HCC)  Acute on chronic diastolic CHF: Recently decreased on diuretic dose as outpatient. on IV Lasix, metolazone during hospitalization.  TTE 03/18/2019 showed LVEF of 55%.  Repeat 2D echocardiogram showed decreased LV function to 30 to 35%.  Cardiology has been consulted..  Continue Toprol-XL.  Hold ACE inhibitor/ ARB.   Good diuretic response so far. Patient follows with Dr. Sallyanne Kuster and Dr. Crissie Sickles, Cardiology.  Troponin borderline elevated but no chest pain.  EKG with ventricular paced rhythm.  Will follow cardiology recommendation.  Lasix on hold day as per cardiology  Acute on chronic hypoxic respiratory failure: Secondary to decompensated CHF.    On 2 L of oxygen by nasal cannula.  Patient uses 2 L of oxygen by nasal cannula at home.  Postsurgical hypothyroidism: Continue Synthroid.  Hyperlipidemia: Continue statins.  Essential hypertension:  Continue hydralazine, Imdur, metoprolol  Complete heart block s/p pacemaker:  Cardiology on board.  Stage IIIb CKD:.  Creatinine currently at baseline, creatinine at 1.6 today..  Cardiology on board.  Hold Lasix for today due to rising creatinine levels.  S/p mechanical mitral and aortic valve: INR 2.4, goal likely 2.5-3.5.  Coumadin per pharmacy.  COPD: Former smoker.  No clinical bronchospasm.  As needed bronchodilator nebulizations.  Persistent atrial flutter/A. fib: Continue Toprol-XL and Coumadin per  pharmacy, on anticoagulation.  Rate controlled at this time.  Anemia: Anemia of chronic disease.  Hemoglobin of 9.7.  VTE Prophylaxis: Warfarin  Code Status: Full  code  Family Communication: I tried to reach the patient's niece Ms. Dollree Lane on the phone but was unable to reach her.  Status is: Inpatient  Remains inpatient appropriate because:Inpatient level of care appropriate due to severity of illness and Congestive heart failure needing IV diuresis, cardiology followup  Dispo: The patient is from: Home              Anticipated d/c is to: Home.  Patient lives with her niece at home.              Anticipated d/c date is: 2 days              Patient currently is not medically stable to d/c.  Consultants:  Cardiology  Procedures:  None  Antibiotics:  . None  Anti-infectives (From admission, onward)   None     Subjective: Today, patient was seen and examined at bedside. Complains of wheezing while eating, breathing better. Denies chest pain, fever, chills or rigor.  Objective: Vitals:   02/02/20 0843 02/02/20 0845  BP:    Pulse:    Resp:    Temp:    SpO2: 98% 98%    Intake/Output Summary (Last 24 hours) at 02/02/2020 1154 Last data filed at 02/02/2020 0533 Gross per 24 hour  Intake 480 ml  Output 1800 ml  Net -1320 ml   Filed Weights   01/31/20 1954 02/01/20 0500 02/02/20 0500  Weight: 87.5 kg 84.2 kg 82.2 kg   Body mass index is 28.38 kg/m.   Physical Exam:  GENERAL: Patient is alert awake and oriented. Not in obvious distress.  Nasal cannula oxygen at 2 L/min. HENT: No scleral pallor or icterus. Pupils equally reactive to light. Oral mucosa is moist.  Neck scar NECK: is supple, no gross swelling noted. CHEST: Diminished breath sounds bilateral.  Few basal crackles.  Midline sternal scar CVS: S1 and S2 heard, systolic murmur noted..  Mechanical click noted.  Left chest wall pacemaker in place, trace edema. ABDOMEN: Soft, non-tender, bowel sounds are present. EXTREMITIES: No edema. CNS: Cranial nerves are intact. No focal motor deficits. SKIN: warm and dry without rashes.  Data Review: I have personally  reviewed the following laboratory data and studies,  CBC: Recent Labs  Lab 01/26/20 1918 01/30/20 2340 01/30/20 2356 02/01/20 0515  WBC 6.3 6.9  --  6.0  NEUTROABS 5.1 5.7  --  4.5  HGB 8.8* 9.1* 10.2* 9.7*  HCT 28.9* 30.1* 30.0* 31.7*  MCV 97.0 97.7  --  96.1  PLT 169 182  --  154   Basic Metabolic Panel: Recent Labs  Lab 01/26/20 1918 01/30/20 2340 01/30/20 2356 02/01/20 0515 02/02/20 0508  NA 133* 136 136 137 133*  K 5.1 4.8 4.7 4.5 5.4*  CL 95* 99 97* 93* 89*  CO2 27 28  --  34* 34*  GLUCOSE 101* 104* 101* 84 90  BUN 49* 35* 33* 36* 47*  CREATININE 1.55* 1.41* 1.40* 1.53* 1.63*  CALCIUM 8.8* 8.9  --  9.2 8.8*   Liver Function Tests: Recent Labs  Lab 01/26/20 1918 01/30/20 2340  AST 25 25  ALT 17 16  ALKPHOS 48 53  BILITOT 0.7 0.3  PROT 7.4 7.7  ALBUMIN 3.7 3.8   Recent Labs  Lab 01/26/20 1918  LIPASE 47   No results for  input(s): AMMONIA in the last 168 hours. Cardiac Enzymes: No results for input(s): CKTOTAL, CKMB, CKMBINDEX, TROPONINI in the last 168 hours. BNP (last 3 results) Recent Labs    01/02/20 1315 01/26/20 1918 01/30/20 2340  BNP 516.0* 489.0* 778.2*    ProBNP (last 3 results) No results for input(s): PROBNP in the last 8760 hours.  CBG: No results for input(s): GLUCAP in the last 168 hours. Recent Results (from the past 240 hour(s))  Urine culture     Status: Abnormal   Collection Time: 01/26/20  6:47 PM   Specimen: Urine, Random  Result Value Ref Range Status   Specimen Description   Final    URINE, RANDOM Performed at [facility], [address] [place], [place] [address]    Special Requests   Final    NONE Performed at [facility], [address], [place], [place] [address]    Culture (A)  Final    <10,000 COLONIES/mL INSIGNIFICANT GROWTH Performed at [place] [address] [place], [place] [address]    Report Status 01/28/2020 FINAL  Final  Respiratory Panel by RT PCR (Flu A&B, Covid) -  Nasopharyngeal Swab     Status: None   Collection Time: 01/31/20  5:09 AM   Specimen: Nasopharyngeal Swab  Result Value Ref Range Status   SARS Coronavirus 2 by RT PCR NEGATIVE NEGATIVE Final    Comment: (NOTE) SARS-CoV-2 target nucleic acids are NOT DETECTED. The SARS-CoV-2 RNA is generally detectable in upper respiratoy specimens during the acute phase of infection. The lowest concentration of SARS-CoV-2 viral copies this assay can detect is 131 copies/mL. A negative result does not preclude SARS-Cov-2 infection and should not be used as the sole basis for treatment or other patient management decisions. A negative result may occur with  improper specimen collection/handling, submission of specimen other than nasopharyngeal swab, presence of viral mutation(s) within the areas targeted by this assay, and inadequate number of viral copies (<131 copies/mL). A negative result must be combined with clinical observations, patient history, and epidemiological information. The expected result is Negative. Fact Sheet for Patients:  PinkCheek.be Fact Sheet for Healthcare Providers:  GravelBags.it This test is not yet ap proved or cleared by the [place] FDA and  has been authorized for detection and/or diagnosis of SARS-CoV-2 by FDA under an Emergency Use Authorization (EUA). This EUA will remain  in effect (meaning this test can be used) for the duration of the COVID-19 declaration under Section 564(b)(1) of the Act, 21 U.S.C. section 360bbb-3(b)(1), unless the authorization is terminated or revoked sooner.    Influenza A by PCR NEGATIVE NEGATIVE Final   Influenza B by PCR NEGATIVE NEGATIVE Final    Comment: (NOTE) The Xpert Xpress SARS-CoV-2/FLU/RSV assay is intended as an aid in  the diagnosis of influenza from Nasopharyngeal swab specimens and  should not be used as a sole basis for treatment. Nasal washings and  aspirates  are unacceptable for Xpert Xpress SARS-CoV-2/FLU/RSV  testing. Fact Sheet for Patients: PinkCheek.be Fact Sheet for Healthcare Providers: GravelBags.it This test is not yet approved or cleared by the [place] FDA and  has been authorized for detection and/or diagnosis of SARS-CoV-2 by  FDA under an Emergency Use Authorization (EUA). This EUA will remain  in effect (meaning this test can be used) for the duration of the  Covid-19 declaration under Section 564(b)(1) of the Act, 21  U.S.C. section 360bbb-3(b)(1), unless the authorization is  terminated or revoked. Performed at [facility], [place] [address], [place], [place] [address]      Studies: ECHOCARDIOGRAM COMPLETE  Result Date: 01/31/2020    ECHOCARDIOGRAM REPORT   Patient Name:   Jaynie Aslinger Date of Exam: 01/31/2020 Medical Rec #:  [id number]      Height:       67.0 in Accession #:    [id number]     Weight:       193.0 lb Date of Birth:  04/17/1932      BSA:          1.992 m Patient Age:    27 years       BP:           174/74 mmHg Patient Gender: F              HR:           102 bpm. Exam Location:  Inpatient Procedure: 2D Echo, Cardiac Doppler and Color Doppler Indications:    I50.40* Unspecified combined systolic (congestive) and diastolic                 (congestive) heart failure  History:        Patient has prior history of Echocardiogram examinations, most                 recent 03/18/2019. Cardiomyopathy and CHF, Abnormal ECG, COPD;                 Risk Factors:Hypertension. [employer] AVR. Mitral valve                 replacement. Covid 19 2/21. Hypoxia.  Sonographer:    Roseanna Rainbow RDCS Referring Phys: [place]  1. Left ventricular ejection fraction, by estimation, is 30 to 35%. The left ventricle has moderately decreased function. The left ventricle demonstrates global hypokinesis. There is mild concentric left ventricular  hypertrophy. Left ventricular diastolic function could not be evaluated.  2. Right ventricular systolic function is normal. The right ventricular size is normal. There is severely elevated pulmonary artery systolic pressure. The estimated right ventricular systolic pressure is 36.6 mmHg.  3. Left atrial size was severely dilated.  4. Right atrial size was moderately dilated.  5. The mitral valve has been repaired/replaced. No evidence of mitral valve regurgitation.  6. The aortic valve has been repaired/replaced. Aortic valve regurgitation is not visualized. FINDINGS  Left Ventricle: Left ventricular ejection fraction, by estimation, is 30 to 35%. The left ventricle has moderately decreased function. The left ventricle demonstrates global hypokinesis. The left ventricular internal cavity size was normal in size. There is mild concentric left ventricular hypertrophy. Left ventricular diastolic function could not be evaluated due to atrial fibrillation. Left ventricular diastolic function could not be evaluated. Right Ventricle: The right ventricular size is normal. No increase in right ventricular wall thickness. Right ventricular systolic function is normal. There is severely elevated pulmonary artery systolic pressure. The tricuspid regurgitant velocity is 3.75 m/s, and with an assumed right atrial pressure of 15 mmHg, the estimated right ventricular systolic pressure is 44.0 mmHg. Left Atrium: Left atrial size was severely dilated. Right Atrium: Right atrial size was moderately dilated. Pericardium: There is no evidence of pericardial effusion. Mitral Valve: The mitral valve has been repaired/replaced. No evidence of mitral valve regurgitation. There is a mechanical valve present in the mitral position. MV peak gradient, 16.2 mmHg. The mean mitral valve gradient is 5.0 mmHg. Tricuspid Valve: The tricuspid valve is grossly normal. Tricuspid  valve regurgitation is mild. Aortic Valve: The aortic valve has been  repaired/replaced. Aortic valve regurgitation is not visualized. Aortic valve mean gradient measures 9.0 mmHg. Aortic valve peak gradient measures 23.0 mmHg. Aortic valve area, by VTI measures 1.98 cm. There is a bileaflet valve present in the aortic position. Pulmonic Valve: The pulmonic valve was grossly normal. Pulmonic valve regurgitation is not visualized. No evidence of pulmonic stenosis. Aorta: The aortic root and ascending aorta are structurally normal, with no evidence of dilitation. IAS/Shunts: The atrial septum is grossly normal. Additional Comments: A pacer wire is visualized.  LEFT VENTRICLE PLAX 2D LVIDd:         4.90 cm      Diastology LV PW:         1.58 cm      LV e' lateral:   5.77 cm/s LV IVS:        1.29 cm      LV E/e' lateral: 32.3 LVOT diam:     1.90 cm      LV e' medial:    5.22 cm/s LV SV:         78           LV E/e' medial:  35.7 LV SV Index:   39 LVOT Area:     2.84 cm  LV Volumes (MOD) LV vol d, MOD A2C: 88.9 ml LV vol d, MOD A4C: 101.0 ml LV vol s, MOD A2C: 35.6 ml LV vol s, MOD A4C: 56.6 ml LV SV MOD A2C:     53.3 ml LV SV MOD A4C:     101.0 ml LV SV MOD BP:      54.3 ml RIGHT VENTRICLE         IVC TAPSE (M-mode): 1.7 cm  IVC diam: 2.80 cm LEFT ATRIUM             Index       RIGHT ATRIUM           Index LA diam:        4.35 cm 2.18 cm/m  RA Area:     26.00 cm LA Vol (A2C):   88.9 ml 44.63 ml/m RA Volume:   92.90 ml  46.64 ml/m LA Vol (A4C):   94.9 ml 47.64 ml/m LA Biplane Vol: 95.5 ml 47.95 ml/m  AORTIC VALVE AV Area (Vmax):    1.90 cm AV Area (Vmean):   1.88 cm AV Area (VTI):     1.98 cm AV Vmax:           240.00 cm/s AV Vmean:          133.000 cm/s AV VTI:            0.393 m AV Peak Grad:      23.0 mmHg AV Mean Grad:      9.0 mmHg LVOT Vmax:         161.00 cm/s LVOT Vmean:        88.100 cm/s LVOT VTI:          0.275 m LVOT/AV VTI ratio: 0.70  AORTA Ao Root diam: 2.80 cm Ao Asc diam:  3.80 cm MITRAL VALVE                TRICUSPID VALVE MV Area (PHT): 4.13 cm     TR Peak  grad:   56.2 mmHg MV Peak grad:  16.2 mmHg    TR Vmax:        375.00 cm/s MV Mean grad:  5.0 mmHg MV Vmax:       2.02 m/s     SHUNTS MV Vmean:      98.1 cm/s    Systemic VTI:  0.28 m MV Decel Time: 184 msec     Systemic Diam: 1.90 cm MV E velocity: 186.25 cm/s MV A velocity: 99.15 cm/s MV E/A ratio:  1.88 Mertie Moores MD Electronically signed by Mertie Moores MD Signature Date/Time: 01/31/2020/3:34:43 PM    Final       Flora Lipps, MD  [facility] 02/02/2020

## 2020-02-02 NOTE — Evaluation (Signed)
Occupational Therapy Evaluation Patient Details Name: [name] [name] MRN: [id number] DOB: 04-10-1932 Today's Date: 02/02/2020    History of Present Illness 86 yo female admitted with CHF, bil ankle/foot pain. Hx of CHF, A flutter, Afib, pacemaker, CKD, COPD, gout, DM, vertigo   Clinical Impression   This 86 yo female admitted with above presents to acute OT getting some A with basic ADLs/IADLs pta from PCA, but reports she could manage on the 2 days PCA is not there. Currently she needs Min A for transfers and setup/S -min A for basic ADLs. She will benefit from acute OT with follow up [place] and initially 24 hour S/prn A (until family feels she is safe to be alone as she was pta). We will continue to follow.    Follow Up Recommendations  Home health OT;Supervision/Assistance - 24 hour    Equipment Recommendations  None recommended by OT       Precautions / Restrictions Precautions Precautions: Fall Precaution Comments: O2 dep @ baseline-2L Restrictions Weight Bearing Restrictions: No      Mobility Bed Mobility Overal bed mobility: Needs Assistance Bed Mobility: Supine to Sit;Sit to Supine     Supine to sit: Supervision;HOB elevated Sit to supine: Supervision;HOB elevated   General bed mobility comments: Increased time.  Transfers Overall transfer level: Needs assistance Equipment used: Straight cane Transfers: Sit to/from Stand Sit to Stand: Min assist;From elevated surface         General transfer comment: Assist to power up, cues for safe hand placement. Increased time.    Balance Overall balance assessment: Needs assistance Sitting-balance support: No upper extremity supported;Feet supported Sitting balance-Leahy Scale: Fair     Standing balance support: Bilateral upper extremity supported Standing balance-Leahy Scale: Poor                             ADL either performed or assessed with clinical judgement   ADL Overall ADL's : Needs  assistance/impaired Eating/Feeding: Independent;Sitting   Grooming: Set up;Sitting   Upper Body Bathing: Set up;Sitting   Lower Body Bathing: Moderate assistance Lower Body Bathing Details (indicate cue type and reason): min A sit<>stand Upper Body Dressing : Set up;Sitting   Lower Body Dressing: Maximal assistance Lower Body Dressing Details (indicate cue type and reason): min A sit<>stand Toilet Transfer: Minimal assistance;Stand-pivot   Toileting- Clothing Manipulation and Hygiene: Moderate assistance Toileting - Clothing Manipulation Details (indicate cue type and reason): min A sit<>stand             Vision Patient Visual Report: No change from baseline              Pertinent Vitals/Pain Pain Assessment: Faces Faces Pain Scale: Hurts little more Pain Location: Bil LEs Pain Descriptors / Indicators: Sore;Aching Pain Intervention(s): Limited activity within patient's tolerance;Monitored during session;Repositioned     Hand Dominance Right   Extremity/Trunk Assessment Upper Extremity Assessment Upper Extremity Assessment: Generalized weakness           Communication Communication Communication: No difficulties   Cognition Arousal/Alertness: Awake/alert Behavior During Therapy: WFL for tasks assessed/performed Overall Cognitive Status: Within Functional Limits for tasks assessed                                                Home Living Family/patient expects to be discharged to:: Private residence  Living Arrangements: Other relatives(nephew that works) Available Help at Discharge: Family;Available PRN/intermittently;Personal care attendant(5 days a week; 4 hours per day) Type of Home: House Home Access: Ramped entrance     Home Layout: One level     Bathroom Shower/Tub: Tub/shower unit         Home Equipment: Cane - single point;Walker - 2 wheels;Bedside commode;Shower seat          Prior Functioning/Environment Level  of Independence: Independent with assistive device(s);Needs assistance  Gait / Transfers Assistance Needed: uses cane for ambulation ADL's / Homemaking Assistance Needed: aide assists with bathing, dressing, household tasks   Comments: uses cane for ambulation.        OT Problem List: Decreased strength;Decreased range of motion;Impaired balance (sitting and/or standing);Pain      OT Treatment/Interventions: Self-care/ADL training;DME and/or AE instruction;Patient/family education;Balance training    OT Goals(Current goals can be found in the care plan section) Acute Rehab OT Goals Patient Stated Goal: to be able to go home OT Goal Formulation: With patient Time For Goal Achievement: 02/16/20 Potential to Achieve Goals: Good  OT Frequency: Min 2X/week   Barriers to D/C: Decreased caregiver support  Does not have 24 hour S/prn A          AM-PAC OT "6 Clicks" Daily Activity     Outcome Measure Help from another person eating meals?: None Help from another person taking care of personal grooming?: A Little Help from another person toileting, which includes using toliet, bedpan, or urinal?: A Lot Help from another person bathing (including washing, rinsing, drying)?: A Lot Help from another person to put on and taking off regular upper body clothing?: A Little Help from another person to put on and taking off regular lower body clothing?: A Lot 6 Click Score: 16   End of Session Equipment Utilized During Treatment: Gait belt(SPC)  Activity Tolerance: Patient limited by fatigue Patient left: in bed;with call bell/phone within reach;with bed alarm set  OT Visit Diagnosis: Unsteadiness on feet (R26.81);Other abnormalities of gait and mobility (R26.89);Muscle weakness (generalized) (M62.81);Pain Pain - Right/Left: (both) Pain - part of body: Leg                Time: 3361-2244 OT Time Calculation (min): 22 min Charges:  OT General Charges $OT Visit: 1 Visit OT Evaluation $OT  Eval Moderate Complexity: [place], OTR/L Acute [employer] Pager [phone] Office [phone]     Almon Register 02/02/2020, 3:21 PM

## 2020-02-02 NOTE — Progress Notes (Signed)
   Spoke with Tomi Bamberger, [employer] Rep, who stated RV lead was functioning well. LV threshold 3.75 V/ms and programmed at 4 V/ms. She increased output from to 4.125 V/1.25 ms. She is going to discuss setting with Dr. Sallyanne Kuster to see if any additional changes need to be made.   Darreld Mclean, PA-C 02/02/2020 2:54 PM

## 2020-02-03 DIAGNOSIS — I509 Heart failure, unspecified: Secondary | ICD-10-CM

## 2020-02-03 DIAGNOSIS — E785 Hyperlipidemia, unspecified: Secondary | ICD-10-CM

## 2020-02-03 LAB — BASIC METABOLIC PANEL
Anion gap: 10 (ref 5–15)
BUN: 51 mg/dL — ABNORMAL HIGH (ref 8–23)
CO2: 31 mmol/L (ref 22–32)
Calcium: 8.7 mg/dL — ABNORMAL LOW (ref 8.9–10.3)
Chloride: 88 mmol/L — ABNORMAL LOW (ref 98–111)
Creatinine, Ser: 1.58 mg/dL — ABNORMAL HIGH (ref 0.44–1.00)
GFR calc Af Amer: 34 mL/min — ABNORMAL LOW (ref >60)
GFR calc non Af Amer: 30 mL/min — ABNORMAL LOW (ref >60)
Glucose, Bld: 94 mg/dL (ref 70–99)
Potassium: 4.3 mmol/L (ref 3.5–5.1)
Sodium: 129 mmol/L — ABNORMAL LOW (ref 135–145)

## 2020-02-03 LAB — CBC
HCT: 32.9 % — ABNORMAL LOW (ref 36.0–46.0)
Hemoglobin: 10.2 g/dL — ABNORMAL LOW (ref 12.0–15.0)
MCH: 29.1 pg (ref 26.0–34.0)
MCHC: 31 g/dL (ref 30.0–36.0)
MCV: 94 fL (ref 80.0–100.0)
Platelets: 195 10*3/uL (ref 150–400)
RBC: 3.5 MIL/uL — ABNORMAL LOW (ref 3.87–5.11)
RDW: 16.6 % — ABNORMAL HIGH (ref 11.5–15.5)
WBC: 6.3 10*3/uL (ref 4.0–10.5)
nRBC: 0 % (ref 0.0–0.2)

## 2020-02-03 LAB — PROTIME-INR
INR: 2.4 — ABNORMAL HIGH (ref 0.8–1.2)
Prothrombin Time: 25.5 seconds — ABNORMAL HIGH (ref 11.4–15.2)

## 2020-02-03 LAB — MAGNESIUM: Magnesium: 2.1 mg/dL (ref 1.7–2.4)

## 2020-02-03 MED ORDER — WARFARIN SODIUM 5 MG PO TABS: 5.0000 mg | ORAL_TABLET | Freq: Once | ORAL | Status: DC

## 2020-02-03 MED ORDER — ISOSORBIDE MONONITRATE ER 30 MG PO TB24: 15.0000 mg | ORAL_TABLET | Freq: Every morning | ORAL | 2 refills | Status: DC

## 2020-02-03 NOTE — TOC Transition Note (Addendum)
Transition of Care [facility]) - CM/SW Discharge Note   Patient Details  Name: [name] [name] MRN: [id number] Date of Birth: October 16, 1931  Transition of Care [facility]) CM/SW Contact:  Ross Ludwig, LCSW Phone Number: 02/03/2020, 3:37 PM   Clinical Narrative:     CSW met with patient home health agencies, and she stated she had [facility] in [facility] past, patient stated she didn't have a preference.  CSW check with home health agencies, and [facility] agreed to accept patient and could see patient tomorrow, [facility] said they would not be able to see patient until Saturday because of not having any staff available in [place].  CSW updated patient will be going home with home health through [facility] and they can see her within 24 hours, patient was agreeable CSW also updated patient's niece Aretha Parrot and she was okay with it.  CSW signing off please reconsult with any other social work needs, home health agency has been notified of planned discharge.  Patient's niece will be transporting patient back home via private vehicle.  Patient is currently active with [facility] for oxygen, and she has her portable tank with her.   Final next level of care: [place] Barriers to Discharge: Barriers Resolved   Patient Goals and CMS Choice Patient states their goals for this hospitalization and ongoing recovery are:: To go home with home health. CMS Medicare.gov Compare Post Acute Care list provided to:: Patient Choice offered to / list presented to : Patient  Discharge Placement                       Discharge Plan and Services                DME Arranged: N/A         HH Arranged: PT, OT HH Agency: [facility] [place] Date [facility] Agency Contacted: 02/03/20 Time HH Agency Contacted: 1500 Representative spoke with at [place]: [place] ([place]) Interventions     Readmission Risk Interventions Readmission Risk Prevention Plan 02/03/2020 05/31/2019   Transportation Screening Complete Complete  PCP or Specialist Appt within 3-5 Days - Complete  HRI or [place] - Complete  Social Work Consult for [place] Planning/Counseling - Complete  Palliative Care Screening - Complete  Medication Review [employer]) Referral to Pharmacy Complete  PCP or Specialist appointment within 3-5 days of discharge Complete -  [place] or Home Care Consult Complete -  SW Recovery Care/Counseling Consult Complete -  Palliative Care Screening Not Applicable -  [place] Not Applicable -  Some recent data might be hidden

## 2020-02-03 NOTE — Progress Notes (Signed)
Progress Note  Patient Name: [name] [name] Date of Encounter: 02/03/2020  Primary Cardiologist: Sanda Klein, MD   Subjective   Eager to go home, "I feel like I am ready to go home"  Inpatient Medications    Scheduled Meds: . albuterol  2.5 mg Nebulization BID  . allopurinol  100 mg Oral Daily  . cholecalciferol  1,000 Units Oral q AM  . docusate sodium  300 mg Oral QHS  . fluticasone  2 spray Each Nare q morning - 10a  . fluticasone furoate-vilanterol  1 puff Inhalation q AM  . folic acid  1 mg Oral QPM  . gabapentin  300 mg Oral QHS  . hydrALAZINE  25 mg Oral BID  . isosorbide mononitrate  15 mg Oral q morning - 10a  . levothyroxine  75 mcg Oral QAC breakfast  . metolazone  2.5 mg Oral Q Sat  . metoprolol succinate  100 mg Oral QPM  . multivitamin with minerals  1 tablet Oral Daily  . pravastatin  40 mg Oral QPM  . sodium chloride flush  3 mL Intravenous Q12H  . warfarin  5 mg Oral ONCE-1600  . Warfarin - Pharmacist Dosing Inpatient   Does not apply q1600   Continuous Infusions: . sodium chloride     PRN Meds: sodium chloride, acetaminophen, lidocaine, nitroGLYCERIN, ondansetron (ZOFRAN) IV, polyethylene glycol, sodium chloride flush   Vital Signs    Vitals:   02/03/20 0800 02/03/20 0833 02/03/20 0839 02/03/20 1314  BP:    125/70  Pulse:    (!) 59  Resp:    19  Temp:      TempSrc:      SpO2: 99% (!) 85% 96% 100%  Weight:      Height:        Intake/Output Summary (Last 24 hours) at 02/03/2020 1320 Last data filed at 02/03/2020 0955 Gross per 24 hour  Intake 500 ml  Output 650 ml  Net -150 ml   Last 3 Weights 02/03/2020 02/02/2020 02/01/2020  Weight (lbs) 186 lb 4.6 oz 181 lb 3.5 oz 185 lb 10 oz  Weight (kg) 84.5 kg 82.2 kg 84.2 kg      Telemetry    RV paced rhythm - Personally Reviewed  ECG    No new - Personally Reviewed  Physical Exam   GEN: No acute distress.   Neck: No JVD Cardiac: Crisp mechanical S2, S1 is soft but closure sound is  appreciated. 2/6 systolic murmur. L chest wall pacemaker generator pocket is well healed, no incisional dehiscence or drainage. Generator is easily felt in the superior aspect. No erythema, no swelling.  Respiratory: Clear to auscultation bilaterally. GI: Soft, nontender, non-distended  MS: No edema; No deformity. Neuro:  Nonfocal  Psych: Normal affect   Labs    High Sensitivity Troponin:   Recent Labs  Lab 01/26/20 1918 01/26/20 2113 01/30/20 2340 01/31/20 0132  TROPONINIHS 33* 37* 31* 31*      Chemistry Recent Labs  Lab 01/30/20 2340 01/30/20 2356 02/01/20 0515 02/01/20 0515 02/02/20 0508 02/02/20 1213 02/03/20 0347  NA 136   < > 137  --  133*  --  129*  K 4.8   < > 4.5   < > 5.4* 4.5 4.3  CL 99   < > 93*  --  89*  --  88*  CO2 28   < > 34*  --  34*  --  31  GLUCOSE 104*   < > 84  --  90  --  94  BUN 35*   < > 36*  --  47*  --  51*  CREATININE 1.41*   < > 1.53*  --  1.63*  --  1.58*  CALCIUM 8.9   < > 9.2  --  8.8*  --  8.7*  PROT 7.7  --   --   --   --   --   --   ALBUMIN 3.8  --   --   --   --   --   --   AST 25  --   --   --   --   --   --   ALT 16  --   --   --   --   --   --   ALKPHOS 53  --   --   --   --   --   --   BILITOT 0.3  --   --   --   --   --   --   GFRNONAA 34*   < > 31*  --  28*  --  30*  GFRAA 39*   < > 36*  --  33*  --  34*  ANIONGAP 9   < > 10  --  10  --  10   < > = values in this interval not displayed.     Hematology Recent Labs  Lab 01/30/20 2340 01/30/20 2340 01/30/20 2356 02/01/20 0515 02/03/20 0347  WBC 6.9  --   --  6.0 6.3  RBC 3.08*  --   --  3.30* 3.50*  HGB 9.1*   < > 10.2* 9.7* 10.2*  HCT 30.1*   < > 30.0* 31.7* 32.9*  MCV 97.7  --   --  96.1 94.0  MCH 29.5  --   --  29.4 29.1  MCHC 30.2  --   --  30.6 31.0  RDW 17.1*  --   --  16.9* 16.6*  PLT 182  --   --  181 195   < > = values in this interval not displayed.    BNP Recent Labs  Lab 01/30/20 2340  BNP 778.2*     DDimer No results for input(s): DDIMER in  the last 168 hours.   Radiology    No results found.  Cardiac Studies   Echo 5/8 EF newly reduced 30-35%  Well functioning mechanical valves  Patient Profile     86 y.o. 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  She was admitted for acute on chronic heart failure with a newly reduced ejection fraction and evidence of high thresholds on the LV lead suggesting intermittent lack of capture. Assessment & Plan    #1 acute on chronic combined systolic and diastolic heart failure. She has been diuresed and appears euvolemic.  Would restart home Lasix regimen of furosemide 40 mg daily and metolazone once weekly per her last cardiology notes as an outpatient.  I suspect her ejection fraction is secondary to lack of BiV pacing. -Continue Toprol-XL 100 mg daily, hydralazine 25 mg twice daily, Imdur 15 mg daily per patient  #2 persistent atrial fibrillation/atrial flutter Continue Toprol-XL 100 mg daily Continue Coumadin.  #3 mechanical mitral and aortic valves Continue Coumadin INR 2.4 today.  Her goal INR is approximately 3  for mechanical mitral prosthesis.  #4 hypertension Medications as above, blood pressure mildly elevated  #5 hyperlipidemia Continue pravastatin  We will arrange follow-up in cardiology to discuss ongoing management of nonischemic cardiomyopathy.  Medication recommendations as above.  The patient is suitable for hospital dismissal from a cardiovascular perspective.  Discussed with Dr. Louanne Belton.  [facility] will sign off.   Medication Recommendations: as above  For questions or updates, please contact [place] Please consult [email or web address] for contact info under        Signed, Elouise Munroe, MD  02/03/2020, 1:20 PM

## 2020-02-03 NOTE — Progress Notes (Signed)
Discharge instructions and medication list reviewed. IV discontinued as ordered. No complications noted. Vital signs stable. Resting in bed with call light within reach. Niece called for transportation. Waiting for niece to arrive to facility for discharge.

## 2020-02-03 NOTE — Discharge Summary (Signed)
Physician Discharge Summary  [name] [name] TMH:[id number] DOB: Nov 24, 1931 DOA: 01/30/2020  PCP: Fayrene Helper, MD  Admit date: 01/30/2020 Discharge date: 02/03/2020  Admitted From: Home  Discharge disposition: Home with home health health PT, OT   Recommendations for Outpatient Follow-Up:   . Follow up with your primary care provider in one week.  . Check CBC, BMP, magnesium in the next visit. . Follow-up with cardiology as has been scheduled by the clinic.  Discharge Diagnosis:   Principal Problem:   Acute on chronic diastolic CHF (congestive heart failure) (HCC) Active Problems:   Hypothyroidism, postsurgical   Hyperlipidemia LDL goal <70   Essential hypertension   Biventricular automatic implantable cardioverter defibrillator in situ   CKD (chronic kidney disease), stage IV (HCC)   Chronic anticoagulation, on coumadin for Mechanical AVR and MVR   COPD (chronic obstructive pulmonary disease) (HCC)   CHB (complete heart block) (HCC)   Persistent atrial fibrillation (HCC)   Presence of cardiac pacemaker   GERD (gastroesophageal reflux disease)   Acute on chronic respiratory failure with hypoxia ([place])   Discharge Condition: Improved.  Diet recommendation: Low sodium, heart healthy.    Wound care: None.  Code status: Full.   History of Present Illness:   [name] [name] a 86 year old female, lives with her nephew, ambulates with the help of a cane or a walker, PMH of chronic diastolic CHF, atypical atrial flutter/A. fib, complete heart block-pacemaker dependent, CRT-P, s/p mechanical mitral and aortic valve replacement on Coumadin anticoagulation, prosthetic valve associated hemolysis, essential hypertension, CKD stage IIIb, COPD, chronic hypoxic respiratory failure on home oxygen 2 L/min, GERD and gout, presented to the [facility] ED on 01/30/2020 due to progressive dyspnea, leg swelling and weight gain.  Patient had symptoms for several days with dyspnea  at rest and on mild exertion with increasing leg swelling and weight gain.  Due to feeling of weakness,patient had cut down her diuretics. Completed COVID-19 vaccination. Utilized her home inhalers with minimal improvement. Since ED, received Lasix 60 mg IV x1 with good urine output. ED Course:Afebrile, tachypneic, normal heart rate, elevated blood pressures up to systolic in the 921J. Hypoxic with oxygen saturation in the low 90s on 2 L/min oxygen via nasal cannula. ABG: pH 7.404, PCO2 47, PO2 61, HCO3 28.7 and oxygen saturation 89.4. CMP significant for BUN 35, creatinine 1.41. BNP 778. Troponin plateaued in the low 30s. Hemoglobin 9.1. INR 2.3. Flu panel and Covid 19 RT-PCR negative. Chest x-ray was suggestive of pulmonary edema, surgical clips, cardiomegaly, pacemaker. She was then admitted to the hospital for further evaluation and treatment.   Hospital Course:   Following conditions were addressed during hospitalization as listed below,  Acute on chronic diastolic CHF: Recently decreased on diuretic dose as outpatient.  Was continued on on IV Lasix, metolazone during hospitalization.   TTE on 03/18/2019 showed LVEF of 55%.  Repeat 2D echocardiogram on 01/31/20 showed decreased LV function to 30 to 35%.  Cardiology was consulted for decrease EF and cardiology followed the patient during hospitalization.  Biventricular pacemaker was interrogated. Patient follows with Dr. Sallyanne Kuster and Dr. Crissie Sickles, Cardiology.  Troponin borderline elevated but no chest pain.  EKG with ventricular paced rhythm.  Dose of Imdur was decreased on discharge as per cardiology recommendation.Marland Kitchen  Rest of the medications were continued.  Cardiology with an impression of low ejection fraction was likely secondary to lack of biventricular pacing.  Patient will be seen by cardiology as outpatient and cardiology clinic to follow  the patient on discharge.  Acute on chronic hypoxic respiratory failure: Secondary to  decompensated CHF. Currently, at baseline. Patient uses 2 L of oxygen by nasal cannula at home.  Postsurgical hypothyroidism: Continue Synthroid.  Hyperlipidemia:Continue statins.  Essential hypertension: Continue hydralazine, Imdur, metoprolol.  Dose of Imdur has been reduced at this time.  Complete heart block s/p pacemaker:Underwent pacemaker interrogation.  Continue metoprolol at home dose.  Stage IIIb CKD:. Creatinine currently at baseline, creatinine at 1.5 prior to discharge.  Cardiology on board.   S/p mechanical mitral and aortic valve:INR 2.4, INR goal 3.0.  COPD: Former smoker. No clinical bronchospasm.   Persistent atrial flutter/A. fib: Continue Toprol-XL and Coumadin anticoagulation.  Rate controlled at this time.  Anemia: Anemia of chronic disease.  Hemoglobin of 9.7.  Will need outpatient follow-up.  Disposition.  At this time, patient is stable for disposition home.  Will need to follow-up with primary care physician and cardiology as outpatient.  Medical Consultants:    Cardiology  Procedures:    Pacemaker interrogation Subjective:   Today, patient feels better.  Denies any chest pain, increasing shortness of breath, cough, fever or chills.  Wishes to go home.  Discharge Exam:   Vitals:   02/03/20 0839 02/03/20 1314  BP:  125/70  Pulse:  (!) 59  Resp:  19  Temp:    SpO2: 96% 100%   Vitals:   02/03/20 0800 02/03/20 0833 02/03/20 0839 02/03/20 1314  BP:    125/70  Pulse:    (!) 59  Resp:    19  Temp:      TempSrc:      SpO2: 99% (!) 85% 96% 100%  Weight:      Height:        General: Alert awake, not in obvious distress, on nasal cannula oxygen at 2 L/min. HENT: pupils equally reacting to light,  No scleral pallor or icterus noted. Oral mucosa is moist.  Chest: Diminished breath sounds bilaterally.  No wheezes or crackles  CVS: S1 &S2 heard.  Systolic murmur noted.  Mechanical click.  Left chest wall pacemaker in place..   Regular rate and rhythm. Abdomen: Soft, nontender, nondistended.  Bowel sounds are heard.   Extremities: No cyanosis, clubbing but with trace pedal edema.  Peripheral pulses are palpable. Psych: Alert, awake and oriented, normal mood CNS:  No cranial nerve deficits.  Power equal in all extremities.   Skin: Warm and dry.  No rashes noted.  The results of significant diagnostics from this hospitalization (including imaging, microbiology, ancillary and laboratory) are listed below for reference.     Diagnostic Studies:   DG Chest Port 1 View  Result Date: 01/30/2020 CLINICAL DATA:  Patient presents with complaints of shortness of breath that have not gotten better since being seen at [facility] a few days ago. Patient states worsening exertional SOB. Patient is on 2L [place] at home and her sats have been 95%+ on her O2. Patient states she has not had to increase O2 demand. Patient hx COPD. EXAM: PORTABLE CHEST 1 VIEW COMPARISON:  01/02/2020 FINDINGS: Stable changes from prior cardiac surgery and valve replacement. Cardiac silhouette is mildly enlarged. Enlarged pulmonary arteries. No mediastinal or hilar masses. Left anterior chest wall biventricular cardioverter-defibrillator is stable. Lungs demonstrate interstitial thickening similar to the prior study. No lung consolidation. No convincing pulmonary edema. No pleural effusion pneumothorax. Skeletal structures are demineralized but grossly intact. IMPRESSION: 1. No acute cardiopulmonary disease. 2. Cardiomegaly, changes from cardiac surgery and valve replacement, chronic  interstitial thickening and prominent pulmonary arteries suggesting pulmonary hypertension, stable from the prior radiographs. Electronically Signed   By: Lajean Manes M.D.   On: 01/30/2020 23:48   ECHOCARDIOGRAM COMPLETE  Result Date: 01/31/2020    ECHOCARDIOGRAM REPORT   Patient Name:   [name] [name] Date of Exam: 01/31/2020 Medical Rec #:  [id number]      Height:       67.0 in Accession #:     [id number]     Weight:       193.0 lb Date of Birth:  05-Nov-1931      BSA:          1.992 m Patient Age:    77 years       BP:           174/74 mmHg Patient Gender: F              HR:           102 bpm. Exam Location:  Inpatient Procedure: 2D Echo, Cardiac Doppler and Color Doppler Indications:    I50.40* Unspecified combined systolic (congestive) and diastolic                 (congestive) heart failure  History:        Patient has prior history of Echocardiogram examinations, most                 recent 03/18/2019. Cardiomyopathy and CHF, Abnormal ECG, COPD;                 Risk Factors:Hypertension. St Jude AVR. Mitral valve                 replacement. Covid 19 2/21. Hypoxia.  Sonographer:    Roseanna Rainbow RDCS Referring Phys: [place]  1. Left ventricular ejection fraction, by estimation, is 30 to 35%. The left ventricle has moderately decreased function. The left ventricle demonstrates global hypokinesis. There is mild concentric left ventricular hypertrophy. Left ventricular diastolic function could not be evaluated.  2. Right ventricular systolic function is normal. The right ventricular size is normal. There is severely elevated pulmonary artery systolic pressure. The estimated right ventricular systolic pressure is 82.9 mmHg.  3. Left atrial size was severely dilated.  4. Right atrial size was moderately dilated.  5. The mitral valve has been repaired/replaced. No evidence of mitral valve regurgitation.  6. The aortic valve has been repaired/replaced. Aortic valve regurgitation is not visualized. FINDINGS  Left Ventricle: Left ventricular ejection fraction, by estimation, is 30 to 35%. The left ventricle has moderately decreased function. The left ventricle demonstrates global hypokinesis. The left ventricular internal cavity size was normal in size. There is mild concentric left ventricular hypertrophy. Left ventricular diastolic function could not be evaluated due to atrial  fibrillation. Left ventricular diastolic function could not be evaluated. Right Ventricle: The right ventricular size is normal. No increase in right ventricular wall thickness. Right ventricular systolic function is normal. There is severely elevated pulmonary artery systolic pressure. The tricuspid regurgitant velocity is 3.75 m/s, and with an assumed right atrial pressure of 15 mmHg, the estimated right ventricular systolic pressure is 56.2 mmHg. Left Atrium: Left atrial size was severely dilated. Right Atrium: Right atrial size was moderately dilated. Pericardium: There is no evidence of pericardial effusion. Mitral Valve: The mitral valve has been repaired/replaced. No evidence of mitral valve regurgitation. There is a mechanical valve present in the mitral position. MV peak gradient, 16.2 mmHg. The  mean mitral valve gradient is 5.0 mmHg. Tricuspid Valve: The tricuspid valve is grossly normal. Tricuspid valve regurgitation is mild. Aortic Valve: The aortic valve has been repaired/replaced. Aortic valve regurgitation is not visualized. Aortic valve mean gradient measures 9.0 mmHg. Aortic valve peak gradient measures 23.0 mmHg. Aortic valve area, by VTI measures 1.98 cm. There is a bileaflet valve present in the aortic position. Pulmonic Valve: The pulmonic valve was grossly normal. Pulmonic valve regurgitation is not visualized. No evidence of pulmonic stenosis. Aorta: The aortic root and ascending aorta are structurally normal, with no evidence of dilitation. IAS/Shunts: The atrial septum is grossly normal. Additional Comments: A pacer wire is visualized.  LEFT VENTRICLE PLAX 2D LVIDd:         4.90 cm      Diastology LV PW:         1.58 cm      LV e' lateral:   5.77 cm/s LV IVS:        1.29 cm      LV E/e' lateral: 32.3 LVOT diam:     1.90 cm      LV e' medial:    5.22 cm/s LV SV:         78           LV E/e' medial:  35.7 LV SV Index:   39 LVOT Area:     2.84 cm  LV Volumes (MOD) LV vol d, MOD A2C: 88.9 ml  LV vol d, MOD A4C: 101.0 ml LV vol s, MOD A2C: 35.6 ml LV vol s, MOD A4C: 56.6 ml LV SV MOD A2C:     53.3 ml LV SV MOD A4C:     101.0 ml LV SV MOD BP:      54.3 ml RIGHT VENTRICLE         IVC TAPSE (M-mode): 1.7 cm  IVC diam: 2.80 cm LEFT ATRIUM             Index       RIGHT ATRIUM           Index LA diam:        4.35 cm 2.18 cm/m  RA Area:     26.00 cm LA Vol (A2C):   88.9 ml 44.63 ml/m RA Volume:   92.90 ml  46.64 ml/m LA Vol (A4C):   94.9 ml 47.64 ml/m LA Biplane Vol: 95.5 ml 47.95 ml/m  AORTIC VALVE AV Area (Vmax):    1.90 cm AV Area (Vmean):   1.88 cm AV Area (VTI):     1.98 cm AV Vmax:           240.00 cm/s AV Vmean:          133.000 cm/s AV VTI:            0.393 m AV Peak Grad:      23.0 mmHg AV Mean Grad:      9.0 mmHg LVOT Vmax:         161.00 cm/s LVOT Vmean:        88.100 cm/s LVOT VTI:          0.275 m LVOT/AV VTI ratio: 0.70  AORTA Ao Root diam: 2.80 cm Ao Asc diam:  3.80 cm MITRAL VALVE                TRICUSPID VALVE MV Area (PHT): 4.13 cm     TR Peak grad:   56.2 mmHg MV Peak grad:  16.2 mmHg  TR Vmax:        375.00 cm/s MV Mean grad:  5.0 mmHg MV Vmax:       2.02 m/s     SHUNTS MV Vmean:      98.1 cm/s    Systemic VTI:  0.28 m MV Decel Time: 184 msec     Systemic Diam: 1.90 cm MV E velocity: 186.25 cm/s MV A velocity: 99.15 cm/s MV E/A ratio:  1.88 Mertie Moores MD Electronically signed by Mertie Moores MD Signature Date/Time: 01/31/2020/3:34:43 PM    Final      Labs:   Basic Metabolic Panel: Recent Labs  Lab 01/30/20 2340 01/30/20 2340 01/30/20 2356 01/30/20 2356 02/01/20 0515 02/01/20 0515 02/02/20 0508 02/02/20 0508 02/02/20 1213 02/03/20 0347  NA 136  --  136  --  137  --  133*  --   --  129*  K 4.8   < > 4.7   < > 4.5   < > 5.4*   < > 4.5 4.3  CL 99  --  97*  --  93*  --  89*  --   --  88*  CO2 28  --   --   --  34*  --  34*  --   --  31  GLUCOSE 104*  --  101*  --  84  --  90  --   --  94  BUN 35*  --  33*  --  36*  --  47*  --   --  51*  CREATININE 1.41*  --   1.40*  --  1.53*  --  1.63*  --   --  1.58*  CALCIUM 8.9  --   --   --  9.2  --  8.8*  --   --  8.7*  MG  --   --   --   --   --   --   --   --   --  2.1   < > = values in this interval not displayed.   GFR Estimated Creatinine Clearance: 29.1 mL/min (A) (by C-G formula based on SCr of 1.58 mg/dL (H)). Liver Function Tests: Recent Labs  Lab 01/30/20 2340  AST 25  ALT 16  ALKPHOS 53  BILITOT 0.3  PROT 7.7  ALBUMIN 3.8   No results for input(s): LIPASE, AMYLASE in the last 168 hours. No results for input(s): AMMONIA in the last 168 hours. Coagulation profile Recent Labs  Lab 01/30/20 2340 02/01/20 0515 02/02/20 0508 02/03/20 0347  INR 2.3* 2.4* 2.4* 2.4*    CBC: Recent Labs  Lab 01/30/20 2340 01/30/20 2356 02/01/20 0515 02/03/20 0347  WBC 6.9  --  6.0 6.3  NEUTROABS 5.7  --  4.5  --   HGB 9.1* 10.2* 9.7* 10.2*  HCT 30.1* 30.0* 31.7* 32.9*  MCV 97.7  --  96.1 94.0  PLT 182  --  181 195   Cardiac Enzymes: No results for input(s): CKTOTAL, CKMB, CKMBINDEX, TROPONINI in the last 168 hours. BNP: Invalid input(s): POCBNP CBG: No results for input(s): GLUCAP in the last 168 hours. D-Dimer No results for input(s): DDIMER in the last 72 hours. Hgb A1c No results for input(s): HGBA1C in the last 72 hours. Lipid Profile No results for input(s): CHOL, HDL, LDLCALC, TRIG, CHOLHDL, LDLDIRECT in the last 72 hours. Thyroid function studies No results for input(s): TSH, T4TOTAL, T3FREE, THYROIDAB in the last 72 hours.  Invalid input(s): FREET3 Anemia work up No  results for input(s): VITAMINB12, FOLATE, FERRITIN, TIBC, IRON, RETICCTPCT in the last 72 hours. Microbiology Recent Results (from the past 240 hour(s))  Urine culture     Status: Abnormal   Collection Time: 01/26/20  6:47 PM   Specimen: Urine, Random  Result Value Ref Range Status   Specimen Description   Final    URINE, RANDOM Performed at [facility], [address], [place], [place] [address]    Special  Requests   Final    NONE Performed at [facility], [address], [place], [place] [address]    Culture (A)  Final    <10,000 COLONIES/mL INSIGNIFICANT GROWTH Performed at [facility] [address] [place], [place] [address]    Report Status 01/28/2020 FINAL  Final  Respiratory Panel by RT PCR (Flu A&B, Covid) - Nasopharyngeal Swab     Status: None   Collection Time: 01/31/20  5:09 AM   Specimen: Nasopharyngeal Swab  Result Value Ref Range Status   SARS Coronavirus 2 by RT PCR NEGATIVE NEGATIVE Final    Comment: (NOTE) SARS-CoV-2 target nucleic acids are NOT DETECTED. The SARS-CoV-2 RNA is generally detectable in upper respiratoy specimens during the acute phase of infection. The lowest concentration of SARS-CoV-2 viral copies this assay can detect is 131 copies/mL. A negative result does not preclude SARS-Cov-2 infection and should not be used as the sole basis for treatment or other patient management decisions. A negative result may occur with  improper specimen collection/handling, submission of specimen other than nasopharyngeal swab, presence of viral mutation(s) within the areas targeted by this assay, and inadequate number of viral copies (<131 copies/mL). A negative result must be combined with clinical observations, patient history, and epidemiological information. The expected result is Negative. Fact Sheet for Patients:  PinkCheek.be Fact Sheet for Healthcare Providers:  GravelBags.it This test is not yet ap proved or cleared by the [place] FDA and  has been authorized for detection and/or diagnosis of SARS-CoV-2 by FDA under an Emergency Use Authorization (EUA). This EUA will remain  in effect (meaning this test can be used) for the duration of the COVID-19 declaration under Section 564(b)(1) of the Act, 21 U.S.C. section 360bbb-3(b)(1), unless the authorization is terminated or revoked  sooner.    Influenza A by PCR NEGATIVE NEGATIVE Final   Influenza B by PCR NEGATIVE NEGATIVE Final    Comment: (NOTE) The Xpert Xpress SARS-CoV-2/FLU/RSV assay is intended as an aid in  the diagnosis of influenza from Nasopharyngeal swab specimens and  should not be used as a sole basis for treatment. Nasal washings and  aspirates are unacceptable for Xpert Xpress SARS-CoV-2/FLU/RSV  testing. Fact Sheet for Patients: PinkCheek.be Fact Sheet for Healthcare Providers: GravelBags.it This test is not yet approved or cleared by the [place] FDA and  has been authorized for detection and/or diagnosis of SARS-CoV-2 by  FDA under an Emergency Use Authorization (EUA). This EUA will remain  in effect (meaning this test can be used) for the duration of the  Covid-19 declaration under Section 564(b)(1) of the Act, 21  U.S.C. section 360bbb-3(b)(1), unless the authorization is  terminated or revoked. Performed at [facility], [place] [address] [place], [place] [address]      Discharge Instructions:   Discharge Instructions    Diet - low sodium heart healthy   Complete by: As directed    Discharge instructions   Complete by: As directed    Follow-up with your primary care physician in  1 week.  Check blood work at that time.  Follow-up with cardiology as scheduled by the clinic.  Continue to take her medications from home as scheduled.  Fluid restriction 1500 mL/day.  Low-salt diet.  Do not overexert.  If you experience a significant weight gain increasing shortness of breath please seek medical attention.   Increase activity slowly   Complete by: As directed      Allergies as of 02/03/2020      Reactions   Penicillins Other (See Comments)   Bruise Did it involve swelling of the face/tongue/throat, SOB, or low BP? No Did it involve sudden or severe rash/hives, skin peeling, or any reaction on the inside of  your mouth or nose? No Did you need to seek medical attention at a hospital or doctor's office? No When did it last happen?20+ years If all above answers are "NO", may proceed with cephalosporin use.   Aspirin Other (See Comments)   On coumadin   Fentanyl Dermatitis   Rash with patch   Niacin Itching      Medication List    TAKE these medications   acetaminophen 500 MG tablet Commonly known as: TYLENOL Take 500 mg by mouth every 6 (six) hours as needed (pain).   albuterol 108 (90 Base) MCG/ACT inhaler Commonly known as: VENTOLIN HFA INHALE 2 PUFFS INTO THE LUNGS EVERY 6 HOURS AS NEEDED FOR WHEEZING OR SHORTNESS OF BREATH What changed:   how much to take  how to take this  when to take this  reasons to take this  additional instructions   albuterol (2.5 MG/3ML) 0.083% nebulizer solution Commonly known as: PROVENTIL 90INHALE 1 VIAL VIA NEBULIZER THREE TIMES DAILY. What changed:   how much to take  how to take this  when to take this  additional instructions   allopurinol 100 MG tablet Commonly known as: ZYLOPRIM TAKE 1 TABLET(100 MG) BY MOUTH DAILY What changed: See the new instructions.   Breo Ellipta 100-25 MCG/INH Aepb Generic drug: fluticasone furoate-vilanterol 1 TAKE 1 INHALATION BY MOUTH ONCE DAILY What changed:   how much to take  how to take this  when to take this  additional instructions   cholecalciferol 25 MCG (1000 UNIT) tablet Commonly known as: VITAMIN D3 Take 1,000 Units by mouth in the morning.   docusate sodium 100 MG capsule Commonly known as: COLACE Take 300 mg by mouth at bedtime.   fluticasone 50 MCG/ACT nasal spray Commonly known as: FLONASE SHAKE LIQUID AND USE 2 SPRAYS IN EACH NOSTRIL DAILY What changed: See the new instructions.   folic acid 1 MG tablet Commonly known as: FOLVITE TAKE 1 TABLET(1 MG) BY MOUTH DAILY What changed: See the new instructions.   furosemide 40 MG tablet Commonly known as:  LASIX Take 1 tablet (40 mg total) by mouth daily. What changed: when to take this   gabapentin 300 MG capsule Commonly known as: NEURONTIN Take 1 capsule (300 mg total) by mouth 2 (two) times daily. What changed: when to take this   hydrALAZINE 25 MG tablet Commonly known as: APRESOLINE Take 1 tablet (25 mg total) by mouth 2 (two) times daily.   isosorbide mononitrate 30 MG 24 hr tablet Commonly known as: IMDUR Take 0.5 tablets (15 mg total) by mouth every morning. Start taking on: Feb 04, 2020   meclizine 12.5 MG tablet Commonly known as: ANTIVERT Take 1 tablet (12.5 mg total) by mouth 3 (three) times daily as needed for dizziness.   metolazone 2.5 MG  tablet Commonly known as: ZAROXOLYN Take one tablet on Saturdays. What changed:   how much to take  how to take this  when to take this  additional instructions   metoprolol succinate 100 MG 24 hr tablet Commonly known as: TOPROL-XL TAKE 1 TABLET BY MOUTH EVERY DAY WITH FOOD OR MILK What changed:   how much to take  how to take this  when to take this  additional instructions   multivitamin Liqd Take 5 mLs by mouth daily.   nitroGLYCERIN 0.4 MG SL tablet Commonly known as: NITROSTAT Place 1 tablet (0.4 mg total) under the tongue every 5 (five) minutes as needed for chest pain.   ondansetron 4 MG disintegrating tablet Commonly known as: Zofran ODT Take 1 tablet (4 mg total) by mouth every 8 (eight) hours as needed for nausea or vomiting.   ondansetron 4 MG tablet Commonly known as: ZOFRAN Take 4 mg by mouth every 8 (eight) hours as needed for nausea or vomiting.   polyethylene glycol powder 17 GM/SCOOP powder Commonly known as: GLYCOLAX/MIRALAX Take 17 g by mouth daily as needed for mild constipation or moderate constipation.   potassium chloride SA 20 MEQ tablet Commonly known as: KLOR-CON TAKE 1 TABLET(20 MEQ) BY MOUTH DAILY What changed: See the new instructions.   pravastatin 40 MG  tablet Commonly known as: PRAVACHOL TAKE 1 TABLET BY MOUTH EVERY EVENING   SALONPAS PAIN RELIEF PATCH EX Apply 1 patch topically daily as needed (pain).   Synthroid 75 MCG tablet Generic drug: levothyroxine TAKE 1 TABLET BY MOUTH DAILY What changed:   how much to take  how to take this  when to take this  additional instructions   Systane 0.4-0.3 % Gel ophthalmic gel Generic drug: Polyethyl [name] Place 1 application into the left eye at bedtime.   warfarin 5 MG tablet Commonly known as: Jantoven Take as directed. If you are unsure how to take this medication, talk to your nurse or doctor. Original instructions: TAKE 2 TABLETS EVERY DAY EXCEPT 1 TABLET ON SATURDAY What changed:   how much to take  how to take this  when to take this  additional instructions      Follow-up Information    Almyra Deforest, PA Follow up.   Specialties: Cardiology, Radiology Why: Hospital follow-up scheduled for 02/20/2020 at 10:45am with Almyra Deforest, one of Dr. Victorino December PAs. Please arrive 15 minutes early for check-in. If this date/time does not work for you, please call our office to reschedule.  Contact information: [address] [place] [place] [id number] [phone]            Time coordinating discharge: 39 minutes  Signed:  Danayah Smyre  [facility] 02/03/2020, 1:43 PM

## 2020-02-03 NOTE — Progress Notes (Signed)
Rt came to do breathing tx. Pt was found on RA with sats 85%. RT placed pt on 2 LPM [place] sats 95%. No distress noted at this time.

## 2020-02-03 NOTE — Care Management Important Message (Signed)
Important Message  Patient Details  IM Letter given to Evette Cristal SW Case Manager to present to the Patient Name: [name] [name] MRN: [id number] Date of Birth: 12-26-1931   Medicare Important Message Given:  Yes     Kerin Salen 02/03/2020, 10:26 AM

## 2020-02-03 NOTE — Progress Notes (Signed)
ANTICOAGULATION CONSULT NOTE -  Follow Up Consult  Pharmacy Consult for warfarin  Indication: Mechanical mitral and aortic valve  Allergies  Allergen Reactions  . Penicillins Other (See Comments)    Bruise Did it involve swelling of the face/tongue/throat, SOB, or low BP? No Did it involve sudden or severe rash/hives, skin peeling, or any reaction on the inside of your mouth or nose? No Did you need to seek medical attention at a hospital or doctor's office? No When did it last happen?20+ years If all above answers are "NO", may proceed with cephalosporin use.   . Aspirin Other (See Comments)    On coumadin  . Fentanyl Dermatitis    Rash with patch   . Niacin Itching    Patient Measurements: Height: 5\' 7"  (170.2 cm) Weight: 84.5 kg (186 lb 4.6 oz) IBW/kg (Calculated) : 61.6  Vital Signs: Temp: 98.3 F (36.8 C) (05/11 0539) BP: 140/71 (05/11 0539) Pulse Rate: 60 (05/11 0539)  Labs: Recent Labs    02/01/20 0515 02/02/20 0508 02/03/20 0347  HGB 9.7*  --  10.2*  HCT 31.7*  --  32.9*  PLT 181  --  195  LABPROT 25.3* 25.1* 25.5*  INR 2.4* 2.4* 2.4*  CREATININE 1.53* 1.63* 1.58*    Estimated Creatinine Clearance: 29.1 mL/min (A) (by C-G formula based on SCr of 1.58 mg/dL (H)).   Medical History: Past Medical History:  Diagnosis Date  . Acute on chronic diastolic CHF (congestive heart failure) ([place]) 05/19/2012  . Adenomatous polyp of colon 12/16/2002   Dr. [name]/[facility]  . Allergy   . Calculus of gallbladder with chronic cholecystitis without obstruction 02/09/2009   Qualifier: Diagnosis of  By: [name], [name]    . Cataract   . CHB (complete heart block) ([place]) 10/07/2014      . CHF (congestive heart failure) ([place])    a.  EF previously reduced to 25-30% b. EF improved to 60-65% by echo in 10/2017.   Marland Kitchen Chronic kidney disease, stage I 2006  . Constipation       . COPD (chronic obstructive pulmonary disease) ([place])        . DJD (degenerative joint disease) of lumbar spine   . DM (diabetes mellitus) ([place]) 2006  . Esophageal reflux   . Glaucoma   . Gout       . H/O adenomatous polyp of colon 05/24/2012   2004 in [place] colonoscopy 2008 normal    . H/O heart valve replacement with mechanical valve    a. s/p mechanical AVR in 2001 and mechanical MVR in 2004.  Marland Kitchen H/O wisdom tooth extraction   . HIP PAIN, RIGHT 04/23/2009   Qualifier: Diagnosis of  By: [name], [name]    . Hyperlipemia   . IDA (iron deficiency anemia)    Parenteral iron/[name]  . Insomnia       . Nausea without vomiting 06/07/2010   Qualifier: Diagnosis of  By: [name], [name]    . Non-ischemic cardiomyopathy ([place])    a. s/p MDT CRTD  . Obesity, unspecified   . Other and unspecified hyperlipidemia   . Oxygen deficiency 2014   nocturnal;  . Papilloma of breast 03/06/2012   This was diagnosed as a left breast papilloma by ultrasound characteristics and symptoms in 2010. Because of possible medical risk factors no surgical intervention has been done and it did doing annual followups. The area has been stable by physical examination and mammograms and  ultrasound since 2010.   Marland Kitchen Spinal stenosis of lumbar region 02/19/2013  . Spondylolisthesis   . Spondylosis   . Thyroid cancer ([place])    remote thyroidectomy, no recurrence, pt denies in 2016 thyroid cancer  . Type 2 diabetes mellitus with hyperglycemia ([place]) 07/15/2010  . Unspecified hypothyroidism       . 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 Vertigo         Medications:  Scheduled:   Assessment: Pharmacy is consulted to dose warfarin in 86 yo female with PMH of Mechanical mitral and aortic valve replacement.   Per med rec, pt PTA regimen is warfarin 10 mg PO daily except 5 mg on saturdays. This is confirmed by the most recent visit pt had to anticoagulation clinic on 12/31/2019 as available in Chart Review. At this visit, states  that the 5 mg dose is on Tuesdays, but either maintains same total weekly dose. In this same note, it is noted that the target INR for this pt is 2.0 to 2.5 per 04/30/17 INR goal decreased per Dt Taylor's order. Target INR was decreased due to large bruising.   Today, 02/03/20  INR continues to be therapeutic  Hgb, Plts stable  No reported bleeding  No new interacting medications started    Goal of Therapy:  INR 2.0-2.5 Monitor platelets by anticoagulation protocol: Yes   Plan:   Will go with last anticoag note as above and give warfarin 5mg  today per instructions from that note  Daily INR   Monitor oral intake  Monitor for start of any new interacting medication  Monitor for signs and symptoms of bleeding    Adrian Saran, PharmD, BCPS 02/03/2020 9:36 AM

## 2020-02-05 ENCOUNTER — Telehealth: Payer: Self-pay | Admitting: *Deleted

## 2020-02-05 ENCOUNTER — Telehealth: Payer: Self-pay

## 2020-02-05 ENCOUNTER — Ambulatory Visit: Payer: Medicare Other | Admitting: Family Medicine

## 2020-02-05 NOTE — Telephone Encounter (Signed)
Transition Care Management Follow-up Telephone Call   Date discharged?  02-03-20              How have you been since you were released from the hospital? pt has been better still hard for her to get around   Do you understand why you were in the hospital? Yes fluid    Do you understand the discharge instructions? Yes   Where were you discharged to? Home   Items Reviewed:  Medications reviewed:   Allergies reviewed:   Dietary changes reviewed:   Referrals reviewed:    Functional Questionnaire:   Activities of Daily Living (ADLs): pt has an aide that comes in and helps     Any transportation issues/concerns?: aide takes her to some and she has to have someone help    Any patient concerns? no    Confirmed importance and date/time of follow-up visits scheduled yes       Confirmed with patient if condition begins to worsen call PCP or go to the ER.  Patient was given the office number and encouraged to call back with question or concerns.  : yes

## 2020-02-05 NOTE — Telephone Encounter (Signed)
Attempted to call pt for transition of care call na or nvm at either number provided

## 2020-02-05 NOTE — Telephone Encounter (Signed)
Pt called and requested PT referral - said this was ordered in the past but could not go

## 2020-02-06 NOTE — Telephone Encounter (Signed)
Pt has a TOC will be discussed at visit

## 2020-02-09 ENCOUNTER — Other Ambulatory Visit: Payer: Self-pay

## 2020-02-09 ENCOUNTER — Ambulatory Visit (INDEPENDENT_AMBULATORY_CARE_PROVIDER_SITE_OTHER): Payer: Medicare Other

## 2020-02-09 DIAGNOSIS — I5032 Chronic diastolic (congestive) heart failure: Secondary | ICD-10-CM | POA: Diagnosis not present

## 2020-02-09 DIAGNOSIS — Z950 Presence of cardiac pacemaker: Secondary | ICD-10-CM | POA: Diagnosis not present

## 2020-02-09 NOTE — Patient Outreach (Signed)
[facility]) Care Management  02/09/2020  [name] [name] 07-18-1932 [id number]   Red emmi: Reason for alert:  Scheduled follow up- no                              New prescriptions-  I dont know   Placed call to patient and explained reason for call. Patient reports to me that she has all her prescriptions. Reports she has not followed up with MD yet because she was so weak.  I offered to call and see if primary MD visit could be by phone.    Placed call to [facility] and spoke with Vickii Penna who states she did TOC last week and patient was not ready to schedule visit.  Vickii Penna states she will call patient to book an appointment right now.  Patient denies any other needs at this time.  Plan: case closure as no needs identified.   Tomasa Rand, RN, BSN, CEN [facility] [employer] [phone]

## 2020-02-10 NOTE — Progress Notes (Signed)
EPIC Encounter for ICM Monitoring  Patient Name: Tenna Lacko is a 86 y.o. female Date: 02/10/2020 Primary Care Physican: Fayrene Helper, MD Primary Cardiologist:Croitoru Electrophysiologist:Taylor Bi-V Pacing:99.7% 11/24/2019 OfficeWeight:199lbs    Spoke with patient. She's had 3 ER visits since 01/02/2020 with one resulting in inpatient admission stay.   She said she is feeling better and denies any fluid symptoms.  Optivol thoracic impedancenormal.  Prescribed:  Furosemide40 mg 1 tablet daily.   Metolazaone 2.5 mg Take 1tablet oncea week on Saturdays.   Potassium 20 mEq take 1 tablet daily.  Labs: 02/03/2020 Creatinine 1.58, BUN 51, Potassium 4.3, Sodium 129, GFR 30-34 02/02/2020 Creatinine 1.63, BUN 47, Potassium 4.5 Sodium 133, GFR 28-33 02/01/2020 Creatinine 1.53, BUN 36, Potassium 4.5 Sodium 137, GFR 31-36 A complete set of results can be found in Results Review.  Recommendations:  No changes and encouraged to call if experiencing any fluid symptoms.  Follow-up plan: ICM clinic phone appointment on6/21/2021.91 day device clinic remote transmission5/21/2021.  Copy of ICM check sent to Dr.Taylor.  3 month ICM trend: 02/10/2020   1 Year ICM trend:       [name], RN 02/10/2020 3:59 PM

## 2020-02-11 ENCOUNTER — Other Ambulatory Visit: Payer: Self-pay

## 2020-02-11 ENCOUNTER — Ambulatory Visit (INDEPENDENT_AMBULATORY_CARE_PROVIDER_SITE_OTHER): Payer: Medicare Other | Admitting: Pharmacist

## 2020-02-11 DIAGNOSIS — Z952 Presence of prosthetic heart valve: Secondary | ICD-10-CM

## 2020-02-11 DIAGNOSIS — Z5181 Encounter for therapeutic drug level monitoring: Secondary | ICD-10-CM

## 2020-02-11 LAB — POCT INR: INR: 1.9 — AB (ref 2.0–3.0)

## 2020-02-11 NOTE — Patient Instructions (Signed)
Description   Take 2.5 tablets today, then continue warfarin 2 tablets daily except 1 tablet on Tuesdays  Continue greens Recheck in 5 weeks

## 2020-02-13 ENCOUNTER — Ambulatory Visit (INDEPENDENT_AMBULATORY_CARE_PROVIDER_SITE_OTHER): Payer: Medicare Other | Admitting: *Deleted

## 2020-02-13 ENCOUNTER — Telehealth: Payer: Self-pay | Admitting: *Deleted

## 2020-02-13 DIAGNOSIS — I442 Atrioventricular block, complete: Secondary | ICD-10-CM

## 2020-02-13 DIAGNOSIS — I428 Other cardiomyopathies: Secondary | ICD-10-CM

## 2020-02-13 LAB — CUP PACEART REMOTE DEVICE CHECK
Battery Remaining Longevity: 35 mo
Battery Voltage: 2.96 V
Brady Statistic AP VP Percent: 0 %
Brady Statistic AP VS Percent: 0 %
Brady Statistic AS VP Percent: 99.41 %
Brady Statistic AS VS Percent: 0.59 %
Brady Statistic RA Percent Paced: 0 %
Brady Statistic RV Percent Paced: 99.41 %
Date Time Interrogation Session: 20210521034956
Implantable Lead Implant Date: 20040521
Implantable Lead Implant Date: 20071218
Implantable Lead Implant Date: 20160516
Implantable Lead Location: 753858
Implantable Lead Location: 753859
Implantable Lead Location: 753860
Implantable Lead Model: 5076
Implantable Lead Model: 6947
Implantable Pulse Generator Implant Date: 20200506
Lead Channel Impedance Value: 285 Ohm
Lead Channel Impedance Value: 285 Ohm
Lead Channel Impedance Value: 323 Ohm
Lead Channel Impedance Value: 342 Ohm
Lead Channel Impedance Value: 342 Ohm
Lead Channel Impedance Value: 361 Ohm
Lead Channel Impedance Value: 380 Ohm
Lead Channel Impedance Value: 456 Ohm
Lead Channel Impedance Value: 608 Ohm
Lead Channel Impedance Value: 627 Ohm
Lead Channel Impedance Value: 665 Ohm
Lead Channel Impedance Value: 684 Ohm
Lead Channel Impedance Value: 722 Ohm
Lead Channel Impedance Value: 741 Ohm
Lead Channel Pacing Threshold Amplitude: 1.5 V
Lead Channel Pacing Threshold Amplitude: 4.5 V
Lead Channel Pacing Threshold Pulse Width: 0.4 ms
Lead Channel Pacing Threshold Pulse Width: 1.5 ms
Lead Channel Sensing Intrinsic Amplitude: 8.25 mV
Lead Channel Sensing Intrinsic Amplitude: 8.25 mV
Lead Channel Setting Pacing Amplitude: 3 V
Lead Channel Setting Pacing Amplitude: 4.25 V
Lead Channel Setting Pacing Pulse Width: 0.8 ms
Lead Channel Setting Pacing Pulse Width: 1.5 ms
Lead Channel Setting Sensing Sensitivity: 4 mV

## 2020-02-13 NOTE — Telephone Encounter (Signed)
Scheduled CRT-P transmission from 02/13/20 reveals LV output possibly sub-threshold. LV auto threshold measured 4.5V @ 1.17ms, LV output at 4.25V @ 1.62ms on monitor. Pt recently hospitalized for CHF, device interrogated by MDT representative with rising LV threshold and intermittent suspected non-capture noted. Outputs were adjusted to current settings prior to discharge.   Spoke with patient. She reports that it would be challenging for her to come to an additional appointment at the [facility] office next week. She is currently scheduled to see Almyra Deforest, PA, on 02/20/20. Advised pt that I will try to arrange industry assistance for a device check during this visit. Pt verbalizes appreciation and agreement with plan.  Message sent to [employer] representatives.

## 2020-02-16 ENCOUNTER — Ambulatory Visit (INDEPENDENT_AMBULATORY_CARE_PROVIDER_SITE_OTHER): Payer: Medicare Other | Admitting: Family Medicine

## 2020-02-16 ENCOUNTER — Other Ambulatory Visit: Payer: Self-pay

## 2020-02-16 ENCOUNTER — Encounter: Payer: Self-pay | Admitting: Family Medicine

## 2020-02-16 VITALS — BP 110/70 | HR 60 | Temp 97.4°F | Ht 67.0 in | Wt 190.0 lb

## 2020-02-16 DIAGNOSIS — E038 Other specified hypothyroidism: Secondary | ICD-10-CM | POA: Diagnosis not present

## 2020-02-16 DIAGNOSIS — M25511 Pain in right shoulder: Secondary | ICD-10-CM | POA: Diagnosis not present

## 2020-02-16 DIAGNOSIS — I10 Essential (primary) hypertension: Secondary | ICD-10-CM

## 2020-02-16 DIAGNOSIS — Z952 Presence of prosthetic heart valve: Secondary | ICD-10-CM

## 2020-02-16 DIAGNOSIS — Z7901 Long term (current) use of anticoagulants: Secondary | ICD-10-CM

## 2020-02-16 DIAGNOSIS — Z95810 Presence of automatic (implantable) cardiac defibrillator: Secondary | ICD-10-CM

## 2020-02-16 DIAGNOSIS — I5033 Acute on chronic diastolic (congestive) heart failure: Secondary | ICD-10-CM | POA: Diagnosis not present

## 2020-02-16 DIAGNOSIS — N184 Chronic kidney disease, stage 4 (severe): Secondary | ICD-10-CM | POA: Diagnosis not present

## 2020-02-16 LAB — CBC WITH DIFFERENTIAL/PLATELET
Absolute Monocytes: 476 cells/uL (ref 200–950)
Basophils Absolute: 28 cells/uL (ref 0–200)
Basophils Relative: 0.4 %
Eosinophils Absolute: 7 cells/uL — ABNORMAL LOW (ref 15–500)
Eosinophils Relative: 0.1 %
HCT: 30.4 % — ABNORMAL LOW (ref 35.0–45.0)
Hemoglobin: 9.4 g/dL — ABNORMAL LOW (ref 11.7–15.5)
Lymphs Abs: 731 cells/uL — ABNORMAL LOW (ref 850–3900)
MCH: 29.2 pg (ref 27.0–33.0)
MCHC: 30.9 g/dL — ABNORMAL LOW (ref 32.0–36.0)
MCV: 94.4 fL (ref 80.0–100.0)
MPV: 11.2 fL (ref 7.5–12.5)
Monocytes Relative: 6.9 %
Neutro Abs: 5658 cells/uL (ref 1500–7800)
Neutrophils Relative %: 82 %
Platelets: 189 10*3/uL (ref 140–400)
RBC: 3.22 10*6/uL — ABNORMAL LOW (ref 3.80–5.10)
RDW: 14.8 % (ref 11.0–15.0)
Total Lymphocyte: 10.6 %
WBC: 6.9 10*3/uL (ref 3.8–10.8)

## 2020-02-16 LAB — COMPLETE METABOLIC PANEL WITH GFR
AG Ratio: 1.2 (calc) (ref 1.0–2.5)
ALT: 16 U/L (ref 6–29)
AST: 27 U/L (ref 10–35)
Albumin: 3.8 g/dL (ref 3.6–5.1)
Alkaline phosphatase (APISO): 55 U/L (ref 37–153)
BUN/Creatinine Ratio: 29 (calc) — ABNORMAL HIGH (ref 6–22)
BUN: 50 mg/dL — ABNORMAL HIGH (ref 7–25)
CO2: 32 mmol/L (ref 20–32)
Calcium: 9.7 mg/dL (ref 8.6–10.4)
Chloride: 94 mmol/L — ABNORMAL LOW (ref 98–110)
Creat: 1.72 mg/dL — ABNORMAL HIGH (ref 0.60–0.88)
GFR, Est African American: 31 mL/min/{1.73_m2} — ABNORMAL LOW (ref >=60)
GFR, Est Non African American: 27 mL/min/{1.73_m2} — ABNORMAL LOW (ref >=60)
Globulin: 3.3 g/dL (calc) (ref 1.9–3.7)
Glucose, Bld: 89 mg/dL (ref 65–99)
Potassium: 5.1 mmol/L (ref 3.5–5.3)
Sodium: 134 mmol/L — ABNORMAL LOW (ref 135–146)
Total Bilirubin: 0.4 mg/dL (ref 0.2–1.2)
Total Protein: 7.1 g/dL (ref 6.1–8.1)

## 2020-02-16 LAB — TSH: TSH: 2.32 mIU/L (ref 0.40–4.50)

## 2020-02-16 NOTE — Progress Notes (Signed)
Subjective:    Patient ID: [name] [name], female    DOB: 03/31/1932, 86 y.o.   MRN: [id number]  HPI  Patient is a very pleasant 86 year old African-American female who presents today for hospital discharge follow-up.  She normally sees Dr. [name] who is out temporarily on family medical leave.  Therefore the patient is here today for follow-up until Dr. [name] returns to practice.  Patient was recently admitted to the hospital and I have copied relevant portions of the discharge summary below for my reference:  Admit date: 01/30/2020 Discharge date: 02/03/2020  Admitted From: Home  Discharge disposition: Home with home health health PT, OT   Recommendations for Outpatient Follow-Up:    Follow up with your primary care provider in one week.   Check CBC, BMP, magnesium in the next visit.  Follow-up with cardiology as has been scheduled by the clinic.  Discharge Diagnosis:   Principal Problem:   Acute on chronic diastolic CHF (congestive heart failure) (HCC) Active Problems:   Hypothyroidism, postsurgical   Hyperlipidemia LDL goal <70   Essential hypertension   Biventricular automatic implantable cardioverter defibrillator in situ   CKD (chronic kidney disease), stage IV (HCC)   Chronic anticoagulation, on coumadin for Mechanical AVR and MVR   COPD (chronic obstructive pulmonary disease) (HCC)   CHB (complete heart block) (HCC)   Persistent atrial fibrillation (HCC)   Presence of cardiac pacemaker   GERD (gastroesophageal reflux disease)   Acute on chronic respiratory failure with hypoxia ([place])   Discharge Condition: Improved.  Diet recommendation: Low sodium, heart healthy.    Wound care: None.  Code status: Full.   History of Present Illness:   [name] [name] a 86 year old female, lives with her nephew, ambulates with the help of a cane or a walker, PMH of chronic diastolic CHF, atypical atrial flutter/A. fib, complete heart block-pacemaker  dependent, CRT-P, s/p mechanical mitral and aortic valve replacement on Coumadin anticoagulation, prosthetic valve associated hemolysis, essential hypertension, CKD stage IIIb, COPD, chronic hypoxic respiratory failure on home oxygen 2 L/min, GERD and gout, presented to the [facility] ED on 01/30/2020 due to progressive dyspnea, leg swelling and weight gain. Patient had symptoms for several days with dyspnea at rest and on mild exertion with increasing leg swelling and weight gain. Due to feeling of weakness,patient had cut down her diuretics. Completed COVID-19 vaccination. Utilized her home inhalers with minimal improvement. Since ED, received Lasix 60 mg IV x1 with good urine output. ED Course:Afebrile, tachypneic, normal heart rate, elevated blood pressures up to systolic in the 528U. Hypoxic with oxygen saturation in the low 90s on 2 L/min oxygen via nasal cannula. ABG: pH 7.404, PCO2 47, PO2 61, HCO3 28.7 and oxygen saturation 89.4. CMP significant for BUN 35, creatinine 1.41. BNP 778. Troponin plateaued in the low 30s. Hemoglobin 9.1. INR 2.3. Flu panel and Covid 19 RT-PCR negative. Chest x-ray was suggestive of pulmonary edema, surgical clips, cardiomegaly, pacemaker. She was then admitted to the hospital for further evaluation and treatment.   Hospital Course:   Following conditions were addressed during hospitalization as listed below,  Acute on chronic diastolic CHF: Recently decreased on diuretic doseas outpatient.  Was continued onon IV Lasix, metolazone during hospitalization.  TTE on 03/18/2019 showed LVEF of 55%. Repeat 2D echocardiogram on 01/31/20 showed decreased LV functionto 30 to 35%. Cardiology was consulted for decrease EF and cardiology followed the patient during hospitalization.  Biventricular pacemaker was interrogated. Patient follows with Dr. Sallyanne Kuster and Dr. Crissie Sickles, Cardiology.  Troponin borderline elevated but no chest pain. EKG with  ventricular paced rhythm.Dose of Imdur was decreased on discharge as per cardiology recommendation.Marland Kitchen  Rest of the medications were continued.  Cardiology with an impression of low ejection fraction was likely secondary to lack of biventricular pacing.  Patient will be seen by cardiology as outpatient and cardiology clinic to follow the patient on discharge.  Acute on chronic hypoxic respiratory failure: Secondary to decompensated CHF. Currently, at baseline.Patient uses 2 L of oxygen by nasal cannula at home.  Postsurgical hypothyroidism: Continue Synthroid.  Hyperlipidemia:Continue statins.  Essential hypertension:Continue hydralazine, Imdur, metoprolol.  Dose of Imdur has been reduced at this time.  Complete heart block s/p pacemaker:Underwent pacemaker interrogation.  Continue metoprolol at home dose.  Stage IIIb CKD:. Creatinine currently at baseline, creatinine at 1.5 prior to discharge.Cardiology on board.   S/p mechanical mitral and aortic valve:INR 2.4, INR goal 3.0.  COPD: Former smoker. No clinical bronchospasm.   Persistent atrial flutter/A. fib: Continue Toprol-XL and Coumadin anticoagulation.Rate controlled at this time.  Anemia:Anemia of chronic disease. Hemoglobin of 9.7.  Will need outpatient follow-up.  Disposition.  At this time, patient is stable for disposition home.  Will need to follow-up with primary care physician and cardiology as outpatient.   02/16/20 I reviewed the patient's EKGs from the hospital.  It does show a paced rhythm.  However QRS interval is over 180 suggesting failure of biventricular pacing which was cardiology suspicion as to the cause of her low ejection fraction.  She is currently on Toprol, hydralazine, and isosorbide.  She is not on an ACE inhibitor or Entresto or spironolactone.  Most recent lab work does show stage III chronic kidney disease.  Patiently typically sees Dr. Sallyanne Kuster and Dr. Lovena Le as her  cardiologists.  I reviewed their phone notes from last week.  She is scheduled to see cardiology on the 28th and they plan to interrogate her device there due to her suboptimal left ventricular ejection fraction and suspected lack of biventricular pacing.  Patient also has anemia of chronic disease with a baseline hemoglobin between 8.8 and 9.7.  Some of this is due to hemolysis from her mechanical valve per the note in some is due to anemia of chronic disease.  Patient states that she sees a hematologist in [place] for Procrit injections and she has an appointment on Wednesday for this.  She does complain of pain in her left shoulder.  She has pain with abduction greater than 90 degrees and pain with internal and external rotation.  She is requesting a cortisone injection in the left shoulder.  She states that her breathing is at her baseline.  She is 90% on oxygen today however her lungs are clear with no crackles evident on exam.  She only has trace bipedal edema and she does not appear fluid overloaded.  She denies any chest pain.  She denies any fever.  She denies any dysuria since leaving the hospital. Past Medical History:  Diagnosis Date  . Acute on chronic diastolic CHF (congestive heart failure) ([place]) 05/19/2012  . Adenomatous polyp of colon 12/16/2002   Dr. [name]/[facility]  . Allergy   . Calculus of gallbladder with chronic cholecystitis without obstruction 02/09/2009   Qualifier: Diagnosis of  By: [name], [name]    . Cataract   . CHB (complete heart block) ([place]) 10/07/2014      . CHF (congestive heart failure) (HCC)    a.  EF previously reduced to  25-30% b. EF improved to 60-65% by echo in 10/2017.   Marland Kitchen Chronic kidney disease, stage I 2006  . Constipation       . COPD (chronic obstructive pulmonary disease) ([place])       . DJD (degenerative joint disease) of lumbar spine   . DM (diabetes mellitus) ([place]) 2006  . Esophageal reflux   . Glaucoma   . Gout        . H/O adenomatous polyp of colon 05/24/2012   2004 in [place] colonoscopy 2008 normal    . H/O heart valve replacement with mechanical valve    a. s/p mechanical AVR in 2001 and mechanical MVR in 2004.  Marland Kitchen H/O wisdom tooth extraction   . HIP PAIN, RIGHT 04/23/2009   Qualifier: Diagnosis of  By: [name], [name]    . Hyperlipemia   . IDA (iron deficiency anemia)    Parenteral iron/[name]  . Insomnia       . Nausea without vomiting 06/07/2010   Qualifier: Diagnosis of  By: [name], [name]    . Non-ischemic cardiomyopathy ([place])    a. s/p MDT CRTD  . Obesity, unspecified   . Other and unspecified hyperlipidemia   . Oxygen deficiency 2014   nocturnal;  . Papilloma of breast 03/06/2012   This was diagnosed as a left breast papilloma by ultrasound characteristics and symptoms in 2010. Because of possible medical risk factors no surgical intervention has been done and it did doing annual followups. The area has been stable by physical examination and mammograms and ultrasound since 2010.   Marland Kitchen Spinal stenosis of lumbar region 02/19/2013  . Spondylolisthesis   . Spondylosis   . Thyroid cancer ([place])    remote thyroidectomy, no recurrence, pt denies in 2016 thyroid cancer  . Type 2 diabetes mellitus with hyperglycemia ([place]) 07/15/2010  . Unspecified hypothyroidism       . 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 Vertigo        Past Surgical History:  Procedure Laterality Date  . AORTIC AND MITRAL VALVE REPLACEMENT  2001  . BI-VENTRICULAR IMPLANTABLE CARDIOVERTER DEFIBRILLATOR UPGRADE N/A 01/18/2015   Procedure: BI-VENTRICULAR IMPLANTABLE CARDIOVERTER DEFIBRILLATOR UPGRADE;  Surgeon: Evans Lance, MD;  Location: [facility] CATH LAB;  Service: Cardiovascular;  Laterality: N/A;  . BIV ICD GENERATOR CHANGEOUT N/A 01/30/2019   Procedure: BIV ICD GENERATOR CHANGEOUT;  Surgeon: Evans Lance, MD;  Location: [place] CV LAB;  Service: Cardiovascular;   Laterality: N/A;  . CARDIAC CATHETERIZATION    . CARDIAC VALVE REPLACEMENT    . CARDIOVERSION N/A 11/13/2016   Procedure: CARDIOVERSION;  Surgeon: Sanda Klein, MD;  Location: [facility] ENDOSCOPY;  Service: Cardiovascular;  Laterality: N/A;  . COLONOSCOPY  06/12/2007   Rourk- Normal rectum/Normal colon  . COLONOSCOPY  12/16/02   small hemorrhoids  . COLONOSCOPY  06/20/2012   Procedure: COLONOSCOPY;  Surgeon: Daneil Dolin, MD;  Location: [facility] ENDO SUITE;  Service: Endoscopy;  Laterality: N/A;  10:45  . defibrillator implanted 2006    . DOPPLER ECHOCARDIOGRAPHY  2012  . DOPPLER ECHOCARDIOGRAPHY  05,06,07,08,09,2011  . ESOPHAGOGASTRODUODENOSCOPY  06/12/2007   Rourk- normal esophagus, small hiatal hernia, otherwise normal stomach, D1, D2  . EYE SURGERY Right 2005  . EYE SURGERY Left 2006  . ICD LEAD REMOVAL Left 02/08/2015   Procedure: ICD LEAD REMOVAL;  Surgeon: Evans Lance, MD;  Location: [place];  Service: Cardiovascular;  Laterality: Left;  "Will plan extraction and insertion  of a BiV PM"  **Dr. Roxan Hockey backing up case**  . IMPLANTABLE CARDIOVERTER DEFIBRILLATOR (ICD) GENERATOR CHANGE Left 02/08/2015   Procedure: ICD GENERATOR CHANGE;  Surgeon: Evans Lance, MD;  Location: [place];  Service: Cardiovascular;  Laterality: Left;  . INSERT / REPLACE / REMOVE PACEMAKER    . PACEMAKER INSERTION  May 2016  . pacemaker placed  2004  . right breast cyst removed     benign   . right cataract removed     2005  . TEE WITHOUT CARDIOVERSION N/A 11/13/2016   Procedure: TRANSESOPHAGEAL ECHOCARDIOGRAM (TEE);  Surgeon: Sanda Klein, MD;  Location: [facility] ENDOSCOPY;  Service: Cardiovascular;  Laterality: N/A;  . THYROIDECTOMY     Current Outpatient Medications on File Prior to Visit  Medication Sig Dispense Refill  . acetaminophen (TYLENOL) 500 MG tablet Take 500 mg by mouth every 6 (six) hours as needed (pain).    Marland Kitchen albuterol (PROVENTIL) (2.5 MG/3ML) 0.083% nebulizer solution 90INHALE 1 VIAL VIA  NEBULIZER THREE TIMES DAILY. (Patient taking differently: Take 2.5 mg by nebulization in the morning and at bedtime. *May use additional one time if needed for shortness of breath) 300 mL 3  . albuterol (VENTOLIN HFA) 108 (90 Base) MCG/ACT inhaler INHALE 2 PUFFS INTO THE LUNGS EVERY 6 HOURS AS NEEDED FOR WHEEZING OR SHORTNESS OF BREATH (Patient taking differently: Inhale 1-2 puffs into the lungs every 6 (six) hours as needed for wheezing or shortness of breath. ) 54 g 2  . allopurinol (ZYLOPRIM) 100 MG tablet TAKE 1 TABLET(100 MG) BY MOUTH DAILY (Patient taking differently: Take 100 mg by mouth in the morning. ) 90 tablet 1  . BREO ELLIPTA 100-25 MCG/INH AEPB 1 TAKE 1 INHALATION BY MOUTH ONCE DAILY (Patient taking differently: Inhale 1 puff into the lungs in the morning. ) 1 each 4  . cholecalciferol (VITAMIN D3) 25 MCG (1000 UT) tablet Take 1,000 Units by mouth in the morning.     . docusate sodium (COLACE) 100 MG capsule Take 300 mg by mouth at bedtime.     . fluticasone (FLONASE) 50 MCG/ACT nasal spray SHAKE LIQUID AND USE 2 SPRAYS IN EACH NOSTRIL DAILY (Patient taking differently: Place 2 sprays into both nostrils every morning. ) 48 g 1  . folic acid (FOLVITE) 1 MG tablet TAKE 1 TABLET(1 MG) BY MOUTH DAILY (Patient taking differently: Take 1 mg by mouth every evening. ) 100 tablet 5  . furosemide (LASIX) 40 MG tablet Take 1 tablet (40 mg total) by mouth daily. (Patient taking differently: Take 40 mg by mouth every morning. ) 90 tablet 1  . gabapentin (NEURONTIN) 300 MG capsule Take 1 capsule (300 mg total) by mouth 2 (two) times daily. (Patient taking differently: Take 300 mg by mouth at bedtime. ) 60 capsule 2  . hydrALAZINE (APRESOLINE) 25 MG tablet Take 1 tablet (25 mg total) by mouth 2 (two) times daily. 180 tablet 3  . isosorbide mononitrate (IMDUR) 30 MG 24 hr tablet Take 0.5 tablets (15 mg total) by mouth every morning. 30 tablet 2  . Liniments (SALONPAS PAIN RELIEF PATCH EX) Apply 1 patch  topically daily as needed (pain).    . meclizine (ANTIVERT) 12.5 MG tablet Take 1 tablet (12.5 mg total) by mouth 3 (three) times daily as needed for dizziness. 30 tablet 2  . metolazone (ZAROXOLYN) 2.5 MG tablet Take one tablet on Saturdays. (Patient taking differently: Take 2.5 mg by mouth every Saturday. ) 12 tablet 5  . metoprolol succinate (TOPROL-XL) 100  MG 24 hr tablet TAKE 1 TABLET BY MOUTH EVERY DAY WITH FOOD OR MILK (Patient taking differently: Take 100 mg by mouth every evening. ) 90 tablet 1  . Multiple Vitamin (MULTIVITAMIN) LIQD Take 5 mLs by mouth daily.    . ondansetron (ZOFRAN ODT) 4 MG disintegrating tablet Take 1 tablet (4 mg total) by mouth every 8 (eight) hours as needed for nausea or vomiting. 10 tablet 0  . Polyethyl [name] (SYSTANE) 0.4-0.3 % GEL ophthalmic gel Place 1 application into the left eye at bedtime.    . polyethylene glycol powder (GLYCOLAX/MIRALAX) 17 GM/SCOOP powder Take 17 g by mouth daily as needed for mild constipation or moderate constipation.    . potassium chloride SA (KLOR-CON) 20 MEQ tablet TAKE 1 TABLET(20 MEQ) BY MOUTH DAILY (Patient taking differently: Take 20 mEq by mouth daily. ) 90 tablet 1  . pravastatin (PRAVACHOL) 40 MG tablet TAKE 1 TABLET BY MOUTH EVERY EVENING (Patient taking differently: Take 40 mg by mouth every evening. ) 90 tablet 3  . SYNTHROID 75 MCG tablet TAKE 1 TABLET BY MOUTH DAILY (Patient taking differently: Take 75 mcg by mouth daily before breakfast. ) 90 tablet 1  . warfarin (JANTOVEN) 5 MG tablet TAKE 2 TABLETS EVERY DAY EXCEPT 1 TABLET ON SATURDAY (Patient taking differently: Take 5-10 mg by mouth See admin instructions. TAKE 2 TABLETS EVERY DAY EXCEPT 1 TABLET ON Saturday @ 8:30PM) 60 tablet 0  . nitroGLYCERIN (NITROSTAT) 0.4 MG SL tablet Place 1 tablet (0.4 mg total) under the tongue every 5 (five) minutes as needed for chest pain. (Patient not taking: Reported on 02/16/2020) 30 tablet 0   Current  Facility-Administered Medications on File Prior to Visit  Medication Dose Route Frequency Provider Last Rate Last Admin  . epoetin alfa (EPOGEN,PROCRIT) injection 40,000 Units  40,000 Units Subcutaneous Once Farrel Gobble, MD       Allergies  Allergen Reactions  . Penicillins Other (See Comments)    Bruise Did it involve swelling of the face/tongue/throat, SOB, or low BP? No Did it involve sudden or severe rash/hives, skin peeling, or any reaction on the inside of your mouth or nose? No Did you need to seek medical attention at a hospital or doctor's office? No When did it last happen?20+ years If all above answers are "NO", may proceed with cephalosporin use.   . Aspirin Other (See Comments)    On coumadin  . Fentanyl Dermatitis    Rash with patch   . Niacin Itching   Social History   Socioeconomic History  . Marital status: Widowed    Spouse name: Not on file  . Number of children: 0  . Years of education: Not on file  . Highest education level: Not on file  Occupational History  . Occupation: retired    Fish farm manager: RETIRED  Tobacco Use  . Smoking status: Former Smoker    Packs/day: 0.75    Years: 40.00    Pack years: 30.00    Types: Cigarettes    Quit date: 03/08/1987    Years since quitting: 32.9  . Smokeless tobacco: Never Used  Substance and Sexual Activity  . Alcohol use: No    Alcohol/week: 0.0 standard drinks  . Drug use: No  . Sexual activity: Not Currently  Other Topics Concern  . Not on file  Social History Narrative   From [place] (2004)   Social Determinants of Health   Financial Resource Strain:   . Difficulty of Paying Living Expenses:  Food Insecurity:   . Worried About Charity fundraiser in the Last Year:   . Arboriculturist in the Last Year:   Transportation Needs:   . Film/video editor (Medical):   Marland Kitchen Lack of Transportation (Non-Medical):   Physical Activity:   . Days of Exercise per Week:   . Minutes of Exercise per  Session:   Stress:   . Feeling of Stress :   Social Connections:   . Frequency of Communication with Friends and Family:   . Frequency of Social Gatherings with Friends and Family:   . Attends Religious Services:   . Active Member of Clubs or Organizations:   . Attends Archivist Meetings:   Marland Kitchen Marital Status:   Intimate Partner Violence:   . Fear of Current or Ex-Partner:   . Emotionally Abused:   Marland Kitchen Physically Abused:   . Sexually Abused:      Review of Systems  All other systems reviewed and are negative.      Objective:   Physical Exam Vitals reviewed.  Constitutional:      Appearance: Normal appearance.  Neck:     Vascular: No carotid bruit.  Cardiovascular:     Rate and Rhythm: Normal rate and regular rhythm.     Heart sounds: Murmur present.  Pulmonary:     Effort: Pulmonary effort is normal. No respiratory distress.     Breath sounds: Normal breath sounds. No wheezing, rhonchi or rales.  Musculoskeletal:     Right shoulder: Tenderness and bony tenderness present. Decreased range of motion.     Right lower leg: No edema.     Left lower leg: No edema.  Lymphadenopathy:     Cervical: No cervical adenopathy.  Neurological:     Mental Status: She is alert.           Assessment & Plan:  Hx of aortic valve replacement, mechanical  Essential hypertension  Biventricular automatic implantable cardioverter defibrillator in situ  Chronic anticoagulation, on coumadin for Mechanical AVR and MVR  Acute on chronic diastolic CHF (congestive heart failure) ([place]) - Plan: CBC with Differential/Platelet, COMPLETE METABOLIC PANEL WITH GFR, TSH  CKD (chronic kidney disease), stage IV ([place])  Other specified hypothyroidism - Plan: TSH  Acute pain of right shoulder  Regarding the patient's Coumadin, this is managed by her cardiologist.  She has an appointment later this week to see her cardiologist.  She is in normal paced rhythm today.  There is no evidence  of pulmonary edema on her exam.  There is no peripheral edema on her exam so I see no reason to adjust her Lasix dose.  She is currently taking 40 mg a day with 2.5 mg of Zaroxolyn on Saturday.  She is due to recheck a TSH is now on his monitor her hypothyroidism that I can see in quite some time.  Her blood pressure today is adequate.  I will defer adding an ACE inhibitor or Entresto to her cardiologist decision and discretion since they know her so much better than I do.  My understanding is a believe the decline in her ejection fraction is most likely due to failure of biventricular pacing and therefore I believe that they are going to try to adjust this first prior to adding any additional medication.  She is seeing her hematologist later this week to monitor her hemoglobin per her report.  I will check a CBC and a CMP today.  Regarding the pain in her  right shoulder, using sterile technique, I injected the right subacromial space with 2 cc lidocaine, 2 cc of Marcaine, and 2 cc of 40 mg/mL Kenalog.  The patient tolerated the procedure well without complication.

## 2020-02-16 NOTE — Progress Notes (Signed)
Remote pacemaker transmission.   

## 2020-02-17 ENCOUNTER — Telehealth: Payer: Self-pay | Admitting: Physician Assistant

## 2020-02-17 NOTE — Telephone Encounter (Signed)
New message   Patient states that she will be bring someone with her to the office visit on 02/20/2020 with Almyra Deforest. Please advise.

## 2020-02-17 NOTE — Telephone Encounter (Signed)
Pt to bring a support person for her ambulation to her 02/20/20 appt.

## 2020-02-18 ENCOUNTER — Inpatient Hospital Stay (HOSPITAL_COMMUNITY): Payer: Medicare Other

## 2020-02-18 ENCOUNTER — Encounter: Payer: Self-pay | Admitting: Family Medicine

## 2020-02-18 ENCOUNTER — Inpatient Hospital Stay (HOSPITAL_COMMUNITY): Payer: Medicare Other | Attending: Hematology

## 2020-02-18 ENCOUNTER — Other Ambulatory Visit: Payer: Self-pay

## 2020-02-18 ENCOUNTER — Ambulatory Visit (HOSPITAL_COMMUNITY): Payer: Medicare Other

## 2020-02-18 ENCOUNTER — Telehealth (INDEPENDENT_AMBULATORY_CARE_PROVIDER_SITE_OTHER): Payer: Medicare Other | Admitting: Family Medicine

## 2020-02-18 VITALS — BP 148/64 | HR 59 | Temp 97.2°F | Resp 18

## 2020-02-18 VITALS — BP 148/64 | Ht 67.0 in | Wt 190.0 lb

## 2020-02-18 DIAGNOSIS — Z09 Encounter for follow-up examination after completed treatment for conditions other than malignant neoplasm: Secondary | ICD-10-CM

## 2020-02-18 DIAGNOSIS — D649 Anemia, unspecified: Secondary | ICD-10-CM

## 2020-02-18 DIAGNOSIS — I428 Other cardiomyopathies: Secondary | ICD-10-CM | POA: Diagnosis not present

## 2020-02-18 DIAGNOSIS — I4819 Other persistent atrial fibrillation: Secondary | ICD-10-CM | POA: Diagnosis not present

## 2020-02-18 DIAGNOSIS — E611 Iron deficiency: Secondary | ICD-10-CM

## 2020-02-18 DIAGNOSIS — J42 Unspecified chronic bronchitis: Secondary | ICD-10-CM | POA: Diagnosis not present

## 2020-02-18 DIAGNOSIS — N184 Chronic kidney disease, stage 4 (severe): Secondary | ICD-10-CM

## 2020-02-18 DIAGNOSIS — I5032 Chronic diastolic (congestive) heart failure: Secondary | ICD-10-CM | POA: Diagnosis not present

## 2020-02-18 DIAGNOSIS — D631 Anemia in chronic kidney disease: Secondary | ICD-10-CM | POA: Diagnosis not present

## 2020-02-18 DIAGNOSIS — I10 Essential (primary) hypertension: Secondary | ICD-10-CM | POA: Diagnosis not present

## 2020-02-18 LAB — CBC
HCT: 29.5 % — ABNORMAL LOW (ref 36.0–46.0)
Hemoglobin: 9.1 g/dL — ABNORMAL LOW (ref 12.0–15.0)
MCH: 29.3 pg (ref 26.0–34.0)
MCHC: 30.8 g/dL (ref 30.0–36.0)
MCV: 94.9 fL (ref 80.0–100.0)
Platelets: 175 10*3/uL (ref 150–400)
RBC: 3.11 MIL/uL — ABNORMAL LOW (ref 3.87–5.11)
RDW: 15.9 % — ABNORMAL HIGH (ref 11.5–15.5)
WBC: 6.2 10*3/uL (ref 4.0–10.5)
nRBC: 0 % (ref 0.0–0.2)

## 2020-02-18 MED ORDER — EPOETIN ALFA-EPBX 40000 UNIT/ML IJ SOLN
40000.0000 [IU] | Freq: Once | INTRAMUSCULAR | Status: AC
  Administered 2020-02-18: 40000 [IU] via SUBCUTANEOUS
  Filled 2020-02-18: qty 1

## 2020-02-18 NOTE — Assessment & Plan Note (Signed)
Permanent anticoagulation, ICD in place.  Followed by cardiology.

## 2020-02-18 NOTE — Assessment & Plan Note (Signed)
I have continue to encourage her to monitor her fluid restriction, limit salt intake.  In addition to making sure she takes her medications as we have previously discussed.

## 2020-02-18 NOTE — Progress Notes (Signed)
Virtual Visit via Telephone Note   This visit type was conducted due to national recommendations for restrictions regarding the COVID-19 Pandemic (e.g. social distancing) in an effort to limit this patient's exposure and mitigate transmission in our community.  Due to her co-morbid illnesses, this patient is at least at moderate risk for complications without adequate follow up.  This format is felt to be most appropriate for this patient at this time.  The patient did not have access to video technology/had technical difficulties with video requiring transitioning to audio format only (telephone).  All issues noted in this document were discussed and addressed.  No physical exam could be performed with this format.    Evaluation Performed:  Follow-up visit  Date:  02/18/2020   ID:  [name] [name], DOB 1931-10-15, MRN [id number]  Patient Location: Home Provider Location: Office  Location of Patient: Home Location of Provider: Telehealth Consent was obtain for visit to be over via telehealth. I verified that I am speaking with the correct person using two identifiers.  PCP:  Susy Frizzle, MD   Chief Complaint:  Hospital follow up   History of Present Illness:    [name] [name] is a 86 y.o. female with lives with her nephew, ambulates with the help of a cane or walker, past medical history is consistent for chronic diastolic congestive heart failure, atrial fibrillation chronic anticoagulation, complete heart block pacemaker dependent, CRT-P, status post mechanical valve and aortic valve replacement on Coumadin anticoagulation, prosthetic valve associated with hemolysis, hypertension, CKD stage IIIb, COPD, chronic hypoxic respiratory failure on 2 L home oxygen, GERD, gout among others.  She presented to the [facility] ED on May 7 of 2021 due to progressive dyspnea, leg swelling and weight gain.  She had symptoms for several days with dyspnea at rest and on mild exertion with  increasing leg swelling and weight gain.  Due to feeling weak patient had cut down on her diuretics as they make it worse for her.  She has completed her COVID-19 vaccinations.  She utilized her home inhalers with minimal improvement.  She received Lasix 60 mg IV  With good urine output.  ED course she was afebrile, tachypneic, normal heart rate, elevated blood pressure up to systolics of 536R.  Hypoxic with oxygenation in the low 90s on 2 L via nasal cannula.  CMP significant for BUN of 35, creatinine of 1.41, BNP 778, troponin plateaued in the low 30s.  Hemoglobin 9.1.  INR 2.3.  Flu panel and COVID-19 PCR's were negative.  Chest x-ray was suggestive of pulmonary edema, surgical clips, cardiomegaly, pacemaker.  She was admitted to the hospital for further evaluation and treatment.  Hospitalization diagnoses included acute on chronic diastolic heart failure, chronic hypoxic respiratory failure, hypertension, complete heart block, CKD, valve replacement, A. fib, anemia of chronic disease.  She had decreased her own diuretic dose outpatient.  She was continued on IV Lasix,metolzaone during hospitalization.  T head showed a EF of 55% back in 2020, repeat 2D echo on May 8 of 2021 showed decrease EF of 30 to 35%.  Cardiology was consulted for decreased EF and will follow them outpatient.  Biventricular pacemaker was interrogated.  Troponin borderline elevated but no chest pain.  EKG with ventricular paced rhythm.  Dose of Imdur was decreased on discharge as per cardiology recommendation.  Rest of medications were continued.  Cardiology with an impression of low ejection fraction was likely secondary to lack of biventricular pacing.  Patient  will be seen by cardiology as outpatient in at the cardiology clinic and we will follow-up discharge.  Hypoxic secondary to decompensating CHF.  Currently at baseline back at 2 L not having any trouble at home denies chest pain, shortness of breath or any other issues  related to congestive heart failure symptoms that she had prior to presenting to the emergency room.  Per emergency room note she has been to have PT and OT at home.  She reports that she has not received a phone call from them at this time.  She was discharged on May 11.  The patient does not have symptoms concerning for COVID-19 infection (fever, chills, cough, or new shortness of breath).   Past Medical, Surgical, Social History, Allergies, and Medications have been Reviewed.  Past Medical History:  Diagnosis Date  . Acute on chronic diastolic CHF (congestive heart failure) ([place]) 05/19/2012  . Acute on chronic respiratory failure with hypoxia ([place]) 01/31/2020  . Adenomatous polyp of colon 12/16/2002   Dr. [name]/[facility]  . Allergy   . Calculus of gallbladder with chronic cholecystitis without obstruction 02/09/2009   Qualifier: Diagnosis of  By: [name], [name]    . Cataract   . CHB (complete heart block) ([place]) 10/07/2014      . CHF (congestive heart failure) ([place])    a.  EF previously reduced to 25-30% b. EF improved to 60-65% by echo in 10/2017.   Marland Kitchen Chronic kidney disease, stage I 2006  . Constipation       . COPD (chronic obstructive pulmonary disease) ([place])       . DJD (degenerative joint disease) of lumbar spine   . DM (diabetes mellitus) ([place]) 2006  . Esophageal reflux   . Glaucoma   . Gout       . H/O adenomatous polyp of colon 05/24/2012   2004 in [place] colonoscopy 2008 normal    . H/O heart valve replacement with mechanical valve    a. s/p mechanical AVR in 2001 and mechanical MVR in 2004.  Marland Kitchen H/O wisdom tooth extraction   . HIP PAIN, RIGHT 04/23/2009   Qualifier: Diagnosis of  By: [name], [name]    . Hyperlipemia   . IDA (iron deficiency anemia)    Parenteral iron/[name]  . Insomnia       . Nausea without vomiting 06/07/2010   Qualifier: Diagnosis of  By: [name], [name]    . Non-ischemic  cardiomyopathy ([place])    a. s/p MDT CRTD  . Obesity, unspecified   . Other and unspecified hyperlipidemia   . Oxygen deficiency 2014   nocturnal;  . Papilloma of breast 03/06/2012   This was diagnosed as a left breast papilloma by ultrasound characteristics and symptoms in 2010. Because of possible medical risk factors no surgical intervention has been done and it did doing annual followups. The area has been stable by physical examination and mammograms and ultrasound since 2010.   Marland Kitchen Spinal stenosis of lumbar region 02/19/2013  . Spondylolisthesis   . Spondylosis   . Thyroid cancer ([place])    remote thyroidectomy, no recurrence, pt denies in 2016 thyroid cancer  . Type 2 diabetes mellitus with hyperglycemia ([place]) 07/15/2010  . Unspecified hypothyroidism       . 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 Vertigo        Past Surgical History:  Procedure Laterality Date  . AORTIC AND MITRAL VALVE REPLACEMENT  2001  . BI-VENTRICULAR IMPLANTABLE CARDIOVERTER DEFIBRILLATOR UPGRADE N/A 01/18/2015   Procedure: BI-VENTRICULAR IMPLANTABLE CARDIOVERTER DEFIBRILLATOR UPGRADE;  Surgeon: Evans Lance, MD;  Location: [facility] CATH LAB;  Service: Cardiovascular;  Laterality: N/A;  . BIV ICD GENERATOR CHANGEOUT N/A 01/30/2019   Procedure: BIV ICD GENERATOR CHANGEOUT;  Surgeon: Evans Lance, MD;  Location: [place] CV LAB;  Service: Cardiovascular;  Laterality: N/A;  . CARDIAC CATHETERIZATION    . CARDIAC VALVE REPLACEMENT    . CARDIOVERSION N/A 11/13/2016   Procedure: CARDIOVERSION;  Surgeon: Sanda Klein, MD;  Location: [facility] ENDOSCOPY;  Service: Cardiovascular;  Laterality: N/A;  . COLONOSCOPY  06/12/2007   Rourk- Normal rectum/Normal colon  . COLONOSCOPY  12/16/02   small hemorrhoids  . COLONOSCOPY  06/20/2012   Procedure: COLONOSCOPY;  Surgeon: Daneil Dolin, MD;  Location: [facility] ENDO SUITE;  Service: Endoscopy;  Laterality: N/A;  10:45  . defibrillator implanted 2006    . DOPPLER  ECHOCARDIOGRAPHY  2012  . DOPPLER ECHOCARDIOGRAPHY  05,06,07,08,09,2011  . ESOPHAGOGASTRODUODENOSCOPY  06/12/2007   Rourk- normal esophagus, small hiatal hernia, otherwise normal stomach, D1, D2  . EYE SURGERY Right 2005  . EYE SURGERY Left 2006  . ICD LEAD REMOVAL Left 02/08/2015   Procedure: ICD LEAD REMOVAL;  Surgeon: Evans Lance, MD;  Location[facility] OR;  Service: Cardiovascular;  Laterality: Left;  "Will plan extraction and insertion of a BiV PM"  **Dr. Roxan Hockey backing up case**  . IMPLANTABLE CARDIOVERTER DEFIBRILLATOR (ICD) GENERATOR CHANGE Left 02/08/2015   Procedure: ICD GENERATOR CHANGE;  Surgeon: Evans Lance, MD;  Location: [place];  Service: Cardiovascular;  Laterality: Left;  . INSERT / REPLACE / REMOVE PACEMAKER    . PACEMAKER INSERTION  May 2016  . pacemaker placed  2004  . right breast cyst removed     benign   . right cataract removed     2005  . TEE WITHOUT CARDIOVERSION N/A 11/13/2016   Procedure: TRANSESOPHAGEAL ECHOCARDIOGRAM (TEE);  Surgeon: Sanda Klein, MD;  Location[facility] ENDOSCOPY;  Service: Cardiovascular;  Laterality: N/A;  . THYROIDECTOMY       Current Meds  Medication Sig  . acetaminophen (TYLENOL) 500 MG tablet Take 500 mg by mouth every 6 (six) hours as needed (pain).  Marland Kitchen albuterol (PROVENTIL) (2.5 MG/3ML) 0.083% nebulizer solution 90INHALE 1 VIAL VIA NEBULIZER THREE TIMES DAILY. (Patient taking differently: Take 2.5 mg by nebulization in the morning and at bedtime. *May use additional one time if needed for shortness of breath)  . albuterol (VENTOLIN HFA) 108 (90 Base) MCG/ACT inhaler INHALE 2 PUFFS INTO THE LUNGS EVERY 6 HOURS AS NEEDED FOR WHEEZING OR SHORTNESS OF BREATH (Patient taking differently: Inhale 1-2 puffs into the lungs every 6 (six) hours as needed for wheezing or shortness of breath. )  . allopurinol (ZYLOPRIM) 100 MG tablet TAKE 1 TABLET(100 MG) BY MOUTH DAILY (Patient taking differently: Take 100 mg by mouth in the morning. )  . BREO  ELLIPTA 100-25 MCG/INH AEPB 1 TAKE 1 INHALATION BY MOUTH ONCE DAILY (Patient taking differently: Inhale 1 puff into the lungs in the morning. )  . cholecalciferol (VITAMIN D3) 25 MCG (1000 UT) tablet Take 1,000 Units by mouth in the morning.   . docusate sodium (COLACE) 100 MG capsule Take 300 mg by mouth at bedtime.   . fluticasone (FLONASE) 50 MCG/ACT nasal spray SHAKE LIQUID AND USE 2 SPRAYS IN EACH NOSTRIL DAILY (Patient taking differently: Place 2 sprays into both nostrils every morning. )  . folic  acid (FOLVITE) 1 MG tablet TAKE 1 TABLET(1 MG) BY MOUTH DAILY (Patient taking differently: Take 1 mg by mouth every evening. )  . furosemide (LASIX) 40 MG tablet Take 1 tablet (40 mg total) by mouth daily. (Patient taking differently: Take 40 mg by mouth every morning. )  . gabapentin (NEURONTIN) 300 MG capsule Take 1 capsule (300 mg total) by mouth 2 (two) times daily. (Patient taking differently: Take 300 mg by mouth at bedtime. )  . hydrALAZINE (APRESOLINE) 25 MG tablet Take 1 tablet (25 mg total) by mouth 2 (two) times daily.  . isosorbide mononitrate (IMDUR) 30 MG 24 hr tablet Take 0.5 tablets (15 mg total) by mouth every morning.  . Liniments (SALONPAS PAIN RELIEF PATCH EX) Apply 1 patch topically daily as needed (pain).  . meclizine (ANTIVERT) 12.5 MG tablet Take 1 tablet (12.5 mg total) by mouth 3 (three) times daily as needed for dizziness.  . metolazone (ZAROXOLYN) 2.5 MG tablet Take one tablet on Saturdays. (Patient taking differently: Take 2.5 mg by mouth every Saturday. )  . metoprolol succinate (TOPROL-XL) 100 MG 24 hr tablet TAKE 1 TABLET BY MOUTH EVERY DAY WITH FOOD OR MILK (Patient taking differently: Take 100 mg by mouth every evening. )  . Multiple Vitamin (MULTIVITAMIN) LIQD Take 5 mLs by mouth daily.  . nitroGLYCERIN (NITROSTAT) 0.4 MG SL tablet Place 1 tablet (0.4 mg total) under the tongue every 5 (five) minutes as needed for chest pain.  Marland Kitchen ondansetron (ZOFRAN ODT) 4 MG  disintegrating tablet Take 1 tablet (4 mg total) by mouth every 8 (eight) hours as needed for nausea or vomiting.  [name] [name] (SYSTANE) 0.4-0.3 % GEL ophthalmic gel Place 1 application into the left eye at bedtime.  . polyethylene glycol powder (GLYCOLAX/MIRALAX) 17 GM/SCOOP powder Take 17 g by mouth daily as needed for mild constipation or moderate constipation.  . potassium chloride SA (KLOR-CON) 20 MEQ tablet TAKE 1 TABLET(20 MEQ) BY MOUTH DAILY (Patient taking differently: Take 20 mEq by mouth daily. )  . pravastatin (PRAVACHOL) 40 MG tablet TAKE 1 TABLET BY MOUTH EVERY EVENING (Patient taking differently: Take 40 mg by mouth every evening. )  . SYNTHROID 75 MCG tablet TAKE 1 TABLET BY MOUTH DAILY (Patient taking differently: Take 75 mcg by mouth daily before breakfast. )  . warfarin (JANTOVEN) 5 MG tablet TAKE 2 TABLETS EVERY DAY EXCEPT 1 TABLET ON SATURDAY (Patient taking differently: Take 5-10 mg by mouth See admin instructions. TAKE 2 TABLETS EVERY DAY EXCEPT 1 TABLET ON Saturday @ 8:30PM)     Allergies:   Penicillins, Aspirin, Fentanyl, and Niacin   ROS:   Please see the history of present illness.    All other systems reviewed and are negative.   Labs/Other Tests and Data Reviewed:    Recent Labs: 01/30/2020: B Natriuretic Peptide 778.2 02/03/2020: Magnesium 2.1 02/16/2020: ALT 16; BUN 50; Creat 1.72; Potassium 5.1; Sodium 134; TSH 2.32 02/18/2020: Hemoglobin 9.1; Platelets 175   Recent Lipid Panel Lab Results  Component Value Date/Time   CHOL 124 01/05/2020 12:53 PM   CHOL 165 10/16/2016 12:13 PM   TRIG 73 01/05/2020 12:53 PM   HDL 44 (L) 01/05/2020 12:53 PM   HDL 31 (L) 10/16/2016 12:13 PM   CHOLHDL 2.8 01/05/2020 12:53 PM   LDLCALC 65 01/05/2020 12:53 PM    Wt Readings from Last 3 Encounters:  02/18/20 190 lb (86.2 kg)  02/16/20 190 lb (86.2 kg)  02/03/20 186 lb 4.6 oz (84.5 kg)  Objective:    Vital Signs:  BP (!) 148/64   Ht 5\' 7"  (1.702  m)   Wt 190 lb (86.2 kg)   BMI 29.76 kg/m    VITAL SIGNS:  reviewed GEN:  alert and oriented  RESPIRATORY:   no shortness of breath noted in conversation PSYCH:  normal affect and mood   Depression screen [facility] 2/9 02/16/2020 10/08/2019 05/20/2019 03/25/2019 02/05/2019  Decreased Interest 0 0 0 0 0  Down, Depressed, Hopeless 0 0 0 0 0  PHQ - 2 Score 0 0 0 0 0  Altered sleeping 0 - - - -  Tired, decreased energy 0 - - - -  Change in appetite 0 - - - -  Feeling bad or failure about yourself  0 - - - -  Trouble concentrating 0 - - - -  Moving slowly or fidgety/restless 0 - - - -  Suicidal thoughts 0 - - - -  PHQ-9 Score 0 - - - -  Difficult doing work/chores Not difficult at all - - - -  Some recent data might be hidden     ASSESSMENT & PLAN:    1. Encounter for examination following treatment at hospital   2. Persistent atrial fibrillation (HCC)  - CBC - COMPLETE METABOLIC PANEL WITH GFR  3. Chronic diastolic congestive heart failure (HCC)  - CBC - COMPLETE METABOLIC PANEL WITH GFR  4. CKD (chronic kidney disease), stage IV (HCC)  - CBC - COMPLETE METABOLIC PANEL WITH GFR  5. Chronic bronchitis, unspecified chronic bronchitis type ([place])   6. Non-ischemic cardiomyopathy with ICD  - CBC - COMPLETE METABOLIC PANEL WITH GFR  7. Essential hypertension  - CBC - COMPLETE METABOLIC PANEL WITH GFR   Time:   Today, I have spent 30 minutes with the patient with telehealth technology discussing the above problems, issues and reviewing notes, labs, testing from recent hospital stay..     Medication Adjustments/Labs and Tests Ordered: Current medicines are reviewed at length with the patient today.  Concerns regarding medicines are outlined above.   Tests Ordered: Orders Placed This Encounter  Procedures  . CBC  . COMPLETE METABOLIC PANEL WITH GFR    Medication Changes: No orders of the defined types were placed in this encounter.    Disposition:  Follow up  04/15/2020  Signed,  Perlie Mayo, NP  02/18/2020 3:08 PM     [facility]

## 2020-02-18 NOTE — Assessment & Plan Note (Signed)
Is followed by nephrology, balance between managing her fluid restriction for heart failure and kidney function.  She does have elevated BUN on her last CMP and of going to the hospital with congestive heart failure acute on chronic.  Today she denies having any shortness of breath taking her diuretics as directed.  I have encouraged her to continue to make sure she takes in her full allotment of fluid restriction but to monitor salt intake and take her diuretics as directed.

## 2020-02-18 NOTE — Telephone Encounter (Signed)
Confirmed with Tomi Bamberger, they will be able to check patient at appt.  Chanetta Marshall, NP 02/18/2020 8:04 AM

## 2020-02-18 NOTE — Progress Notes (Signed)
[name] [name] presents today for Retacrit injection. Hemoglobin reviewed prior to administration. VSS. Injection tolerated without incident or complaint. See MAR for details. Patient discharged in satisfactory condition with follow up instructions.

## 2020-02-18 NOTE — Telephone Encounter (Signed)
Thank you :)

## 2020-02-18 NOTE — Assessment & Plan Note (Signed)
Recent cardiac decompensation requiring hospitalization.  Updated echo demonstrated EF of 30 to 35%.  Currently feeling much better on diuretics at home.  Continue current treatment and follow-up with cardiologist as scheduled.

## 2020-02-18 NOTE — Patient Instructions (Addendum)
I appreciate the opportunity to provide you with care for your health and wellness. Today we discussed: recent hospital stay   Follow up: 04/15/2020 as scheduled  Labs Friday  No referrals today  Please do not change adjust or stop your medications without calling your Cardiologist to discuss this first.  Please continue to practice social distancing to keep you, your family, and our community safe.  If you must go out, please wear a mask and practice good handwashing.  It was a pleasure to see you and I look forward to continuing to work together on your health and well-being. Please do not hesitate to call the office if you need care or have questions about your care.  Have a wonderful day and week. With Gratitude, Cherly Beach, DNP, AGNP-BC

## 2020-02-18 NOTE — Assessment & Plan Note (Signed)
Reviewed notes, labs, testing while in the hospital.  Ordered updated CBC and CMP to check function and blood levels.  Reports that she is not feeling well overall has follow-up with cardiology this Friday.  Advised that she will need to get her labs at that time.

## 2020-02-18 NOTE — Patient Instructions (Signed)
[facility]  Discharge Instructions:  Retacrit injection received today. _______________________________________________________________  Thank you for choosing [facility] at [facility] to provide your oncology and hematology care.  To afford each patient quality time with our providers, please arrive at least 15 minutes before your scheduled appointment.  You need to re-schedule your appointment if you arrive 10 or more minutes late.  We strive to give you quality time with our providers, and arriving late affects you and other patients whose appointments are after yours.  Also, if you no show three or more times for appointments you may be dismissed from the clinic.  Again, thank you for choosing [facility]. Our hope is that these requests will allow you access to exceptional care and in a timely manner. _______________________________________________________________  If you have questions after your visit, please contact our office at [phone] between the hours of 8:30 a.m. and 5:00 p.m. Voicemails left after 4:30 p.m. will not be returned until the following business day. _______________________________________________________________  For prescription refill requests, have your pharmacy contact our office. _______________________________________________________________  Recommendations made by the consultant and any test results will be sent to your referring physician. _______________________________________________________________ 

## 2020-02-18 NOTE — Assessment & Plan Note (Signed)
Doing well reports taking her medications as directed.  Not having any excessive shortness of breath at this time.

## 2020-02-19 ENCOUNTER — Ambulatory Visit: Payer: Medicare Other | Admitting: Family Medicine

## 2020-02-20 ENCOUNTER — Other Ambulatory Visit: Payer: Self-pay

## 2020-02-20 ENCOUNTER — Encounter: Payer: Self-pay | Admitting: Physician Assistant

## 2020-02-20 ENCOUNTER — Ambulatory Visit (INDEPENDENT_AMBULATORY_CARE_PROVIDER_SITE_OTHER): Payer: Medicare Other | Admitting: Physician Assistant

## 2020-02-20 VITALS — BP 145/70 | HR 60 | Temp 97.3°F | Ht 67.0 in | Wt 189.0 lb

## 2020-02-20 DIAGNOSIS — Z45018 Encounter for adjustment and management of other part of cardiac pacemaker: Secondary | ICD-10-CM

## 2020-02-20 DIAGNOSIS — I4819 Other persistent atrial fibrillation: Secondary | ICD-10-CM | POA: Diagnosis not present

## 2020-02-20 DIAGNOSIS — E119 Type 2 diabetes mellitus without complications: Secondary | ICD-10-CM

## 2020-02-20 DIAGNOSIS — E785 Hyperlipidemia, unspecified: Secondary | ICD-10-CM

## 2020-02-20 DIAGNOSIS — I5042 Chronic combined systolic (congestive) and diastolic (congestive) heart failure: Secondary | ICD-10-CM

## 2020-02-20 DIAGNOSIS — Z952 Presence of prosthetic heart valve: Secondary | ICD-10-CM | POA: Diagnosis not present

## 2020-02-20 NOTE — Patient Instructions (Signed)
Medication Instructions:  NONE ordered at this time of appointment   *If you need a refill on your cardiac medications before your next appointment, please call your pharmacy*  Lab Work: NONE ordered at this time of appointment   If you have labs (blood work) drawn today and your tests are completely normal, you will receive your results only by: Marland Kitchen MyChart Message (if you have MyChart) OR . A paper copy in the mail If you have any lab test that is abnormal or we need to change your treatment, we will call you to review the results.  Testing/Procedures: NONE ordered at this time of appointment   Follow-Up: At [facility], you and your health needs are our priority.  As part of our continuing mission to provide you with exceptional heart care, we have created designated Provider Care Teams.  These Care Teams include your primary Cardiologist (physician) and Advanced Practice Providers (APPs -  Physician Assistants and Nurse Practitioners) who all work together to provide you with the care you need, when you need it.  We recommend signing up for the patient portal called "MyChart".  Sign up information is provided on this After Visit Summary.  MyChart is used to connect with patients for Virtual Visits (Telemedicine).  Patients are able to view lab/test results, encounter notes, upcoming appointments, etc.  Non-urgent messages can be sent to your provider as well.   To learn more about what you can do with MyChart, go to [email or web address].    Your next appointment:   1 month(s) 3 month(s)  The format for your next appointment:   In Person In Person  Provider:   Almyra Deforest, PA-C Sanda Klein, MD  Other Instructions

## 2020-02-20 NOTE — Progress Notes (Signed)
Cardiology Office Note:    Date:  02/22/2020   ID:  [name] [name], DOB 1932/02/09, MRN [id number]  PCP:  Fayrene Helper, MD  Cardiologist:  Sanda Klein, MD  Electrophysiologist:  Cristopher Peru, MD   Referring MD: Fayrene Helper, MD   Chief Complaint  Patient presents with  . Follow-up    seen for Dr. Sallyanne Kuster    History of Present Illness:    [name] [name] is a 86 y.o. female with a hx of persistent atrial fibrillation, complete heart block s/p Medtronic BiV pacemaker 01/30/2019 by Dr. Lovena Le, COPD on home O2, hyperlipidemia, DM 2, hypothyroidism, chronic combined systolic and diastolic heart failure, aortic and mitral replacement with mechanical valve.  Patient underwent mechanical AVR in 2001 and mechanical MVR in 2004 performed in [place].  She had severe cardiomyopathy with EF down to 25%, however this ejection fraction has improved based on the last echocardiogram.  It was reported that she did not have any coronary artery disease by angiography prior to her first valve procedure.  She also has chronic hemolytic anemia secondary to the mechanical valve.  Her initial pacemaker was extracted in May 2016 and she received CRT-D device.  After the implanted biventricular ICD reached elective replacement, patient underwent insertion of a new Medtronic biventricular pacemaker by Dr. Lovena Le on 01/30/2019.  Previous echocardiogram obtained in June 2020 showed EF 55%, normal functioning bioprosthetic valve.  Patient was seen in the ED on 01/26/2020.  She presented with generalized weakness and abdominal pain.  Troponin was borderline elevated.  BNP was 489.  Pacemaker was interrogated and that there was concern that her LV threshold was elevated.  Medtronic tech came out and adjusted the device. More recently, patient was admitted in late May 2021 with heart failure and atrial fibrillation after presenting with worsening shortness of breath.  On arrival, she was tachypneic and  hypertensive.  EKG showed V paced rhythm with possible underlying atrial flutter.  Repeat echocardiogram obtained during this admission showed EF down to 30 to 35%, with global hypokinesis and the elevated PASP of 71.2 mmHg.  She underwent IV diuresis with significant improvement.  She was eventually discharged on her previous home regimen of Lasix 40 mg daily and metolazone once per week.  She was not placed on ACE inhibitor or ARB due to renal function.  During this admission, there was some concern of rising LV threshold with intermittent suspected non-capture noted.  Since discharge, device transmission on 5/21 revealed LV output possibly subthreshold.  Patient presents today for follow-up.  Her discharge weight was 186 pounds.  Her device was reprogrammed by our device rep.  It is unclear if her recent rise of LV threshold and intermittent noncapture would cause her drop in the ejection fraction.  Otherwise she denies any recent chest pain.  She appears to be euvolemic on current therapy without significant lower extremity edema, orthopnea or PND.   Past Medical History:  Diagnosis Date  . Acute on chronic diastolic CHF (congestive heart failure) ([place]) 05/19/2012  . Acute on chronic respiratory failure with hypoxia ([place]) 01/31/2020  . Adenomatous polyp of colon 12/16/2002   Dr. [name]/[facility]  . Allergy   . Calculus of gallbladder with chronic cholecystitis without obstruction 02/09/2009   Qualifier: Diagnosis of  By: [name], [name]    . Cataract   . CHB (complete heart block) ([place]) 10/07/2014      . CHF (congestive heart failure) ([place])  a.  EF previously reduced to 25-30% b. EF improved to 60-65% by echo in 10/2017.   Marland Kitchen Chronic kidney disease, stage I 2006  . Constipation       . COPD (chronic obstructive pulmonary disease) ([place])       . DJD (degenerative joint disease) of lumbar spine   . DM (diabetes mellitus) ([place]) 2006  . Esophageal reflux     . Glaucoma   . Gout       . H/O adenomatous polyp of colon 05/24/2012   2004 in [place] colonoscopy 2008 normal    . H/O heart valve replacement with mechanical valve    a. s/p mechanical AVR in 2001 and mechanical MVR in 2004.  Marland Kitchen H/O wisdom tooth extraction   . HIP PAIN, RIGHT 04/23/2009   Qualifier: Diagnosis of  By: [name], [name]    . Hyperlipemia   . IDA (iron deficiency anemia)    Parenteral iron/[name]  . Insomnia       . Nausea without vomiting 06/07/2010   Qualifier: Diagnosis of  By: [name], [name]    . Non-ischemic cardiomyopathy ([place])    a. s/p MDT CRTD  . Obesity, unspecified   . Other and unspecified hyperlipidemia   . Oxygen deficiency 2014   nocturnal;  . Papilloma of breast 03/06/2012   This was diagnosed as a left breast papilloma by ultrasound characteristics and symptoms in 2010. Because of possible medical risk factors no surgical intervention has been done and it did doing annual followups. The area has been stable by physical examination and mammograms and ultrasound since 2010.   Marland Kitchen Spinal stenosis of lumbar region 02/19/2013  . Spondylolisthesis   . Spondylosis   . Thyroid cancer ([place])    remote thyroidectomy, no recurrence, pt denies in 2016 thyroid cancer  . Type 2 diabetes mellitus with hyperglycemia ([place]) 07/15/2010  . Unspecified hypothyroidism       . 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 Vertigo         Past Surgical History:  Procedure Laterality Date  . AORTIC AND MITRAL VALVE REPLACEMENT  2001  . BI-VENTRICULAR IMPLANTABLE CARDIOVERTER DEFIBRILLATOR UPGRADE N/A 01/18/2015   Procedure: BI-VENTRICULAR IMPLANTABLE CARDIOVERTER DEFIBRILLATOR UPGRADE;  Surgeon: Evans Lance, MD;  Location: [facility] CATH LAB;  Service: Cardiovascular;  Laterality: N/A;  . BIV ICD GENERATOR CHANGEOUT N/A 01/30/2019   Procedure: BIV ICD GENERATOR CHANGEOUT;  Surgeon: Evans Lance, MD;  Location: [place] CV LAB;   Service: Cardiovascular;  Laterality: N/A;  . CARDIAC CATHETERIZATION    . CARDIAC VALVE REPLACEMENT    . CARDIOVERSION N/A 11/13/2016   Procedure: CARDIOVERSION;  Surgeon: Sanda Klein, MD;  Location: [facility] ENDOSCOPY;  Service: Cardiovascular;  Laterality: N/A;  . COLONOSCOPY  06/12/2007   [place]- Normal rectum/Normal colon  . COLONOSCOPY  12/16/02   small hemorrhoids  . COLONOSCOPY  06/20/2012   Procedure: COLONOSCOPY;  Surgeon: Daneil Dolin, MD;  Location: [facility] ENDO SU[facility];  Service: Endoscopy;  Laterality: N/A;  10:45  . defibrillator implanted 2006    . DOPPLER ECHOCARDIOGRAPHY  2012  . DOPPLER ECHOCARDIOGRAPHY  05,06,07,08,09,2011  . ESOPHAGOGASTRODUODENOSCOPY  06/12/2007   [place]ourk- normal esophagus, small hiatal hernia, otherwise normal stomach, D1, D2  . EYE SURGERY Right 2005  . EYE SURGERY Left 2006  . ICD LEAD REMOVAL Left 02/08/2015   Procedure: ICD LEAD REMOVAL;  Surgeon: Evans Lance, MD;  Location: [place];  Service: Cardiovascular;  Laterality: Left;  "Will plan extraction and insertion of a BiV PM"  **Dr. Roxan Hockey backing up case**  . IMPLANTABLE CARDIOVERTER DEFIBRILLATOR (ICD) GENERATOR CHANGE Left 02/08/2015   Procedure: ICD GENERATOR CHANGE;  Surgeon: Evans Lance, MD;  Location: [place];  Service: Cardiovascular;  Laterality: Left;  . INSERT / REPLACE / REMOVE PACEMAKER    . PACEMAKER INSERTION  May 2016  . pacemaker placed  2004  . right breast cyst removed     benign   . right cataract removed     2005  . TEE WITHOUT CARDIOVERSION N/A 11/13/2016   Procedure: TRANSESOPHAGEAL ECHOCARDIOGRAM (TEE);  Surgeon: Sanda Klein, MD;  Location: [facility] ENDOSCOPY;  Service: Cardiovascular;  Laterality: N/A;  . THYROIDECTOMY      Current Medications: Current Meds  Medication Sig  . acetaminophen (TYLENOL) 500 MG tablet Take 500 mg by mouth every 6 (six) hours as needed (pain).  Marland Kitchen albuterol (PROVENTIL) (2.5 MG/3ML) 0.083% nebulizer solution 90INHALE 1 VIAL VIA NEBULIZER THREE  TIMES DAILY. (Patient taking differently: Take 2.5 mg by nebulization in the morning and at bedtime. *May use additional one time if needed for shortness of breath)  . albuterol (VENTOLIN HFA) 108 (90 Base) MCG/ACT inhaler INHALE 2 PUFFS INTO THE LUNGS EVERY 6 HOURS AS NEEDED FOR WHEEZING OR SHORTNESS OF BREATH (Patient taking differently: Inhale 1-2 puffs into the lungs every 6 (six) hours as needed for wheezing or shortness of breath. )  . allopurinol (ZYLOPRIM) 100 MG tablet TAKE 1 TABLET(100 MG) BY MOUTH DAILY (Patient taking differently: Take 100 mg by mouth in the morning. )  . BREO ELLIPTA 100-25 MCG/INH AEPB 1 TAKE 1 INHALATION BY MOUTH ONCE DAILY (Patient taking differently: Inhale 1 puff into the lungs in the morning. )  . cholecalciferol (VITAMIN D3) 25 MCG (1000 UT) tablet Take 1,000 Units by mouth in the morning.   . docusate sodium (COLACE) 100 MG capsule Take 300 mg by mouth at bedtime.   . fluticasone (FLONASE) 50 MCG/ACT nasal spray SHAKE LIQUID AND USE 2 SPRAYS IN EACH NOSTRIL DAILY (Patient taking differently: Place 2 sprays into both nostrils every morning. )  . folic acid (FOLVITE) 1 MG tablet TAKE 1 TABLET(1 MG) BY MOUTH DAILY (Patient taking differently: Take 1 mg by mouth every evening. )  . furosemide (LASIX) 40 MG tablet Take 1 tablet (40 mg total) by mouth daily. (Patient taking differently: Take 40 mg by mouth every morning. )  . gabapentin (NEURONTIN) 300 MG capsule Take 1 capsule (300 mg total) by mouth 2 (two) times daily. (Patient taking differently: Take 300 mg by mouth at bedtime. )  . hydrALAZINE (APRESOLINE) 25 MG tablet Take 1 tablet (25 mg total) by mouth 2 (two) times daily.  . isosorbide mononitrate (IMDUR) 30 MG 24 hr tablet Take 0.5 tablets (15 mg total) by mouth every morning.  . Liniments (SALONPAS PAIN RELIEF PATCH EX) Apply 1 patch topically daily as needed (pain).  . meclizine (ANTIVERT) 12.5 MG tablet Take 1 tablet (12.5 mg total) by mouth 3 (three) times  daily as needed for dizziness.  . metolazone (ZAROXOLYN) 2.5 MG tablet Take one tablet on Saturdays. (Patient taking differently: Take 2.5 mg by mouth every Saturday. )  . metoprolol succinate (TOPROL-XL) 100 MG 24 hr tablet TAKE 1 TABLET BY MOUTH EVERY DAY WITH FOOD OR MILK (Patient taking differently: Take 100 mg by mouth every evening. )  . Multiple Vitamin (MULTIVITAMIN) LIQD Take 5 mLs by mouth daily.  . nitroGLYCERIN (  NITROSTAT) 0.4 MG SL tablet Place 1 tablet (0.4 mg total) under the tongue every 5 (five) minutes as needed for chest pain.  Marland Kitchen ondansetron (ZOFRAN ODT) 4 MG disintegrating tablet Take 1 tablet (4 mg total) by mouth every 8 (eight) hours as needed for nausea or vomiting.  [name] [name] (SYSTANE) 0.4-0.3 % GEL ophthalmic gel Place 1 application into the left eye at bedtime.  . polyethylene glycol powder (GLYCOLAX/MIRALAX) 17 GM/SCOOP powder Take 17 g by mouth daily as needed for mild constipation or moderate constipation.  . potassium chloride SA (KLOR-CON) 20 MEQ tablet TAKE 1 TABLET(20 MEQ) BY MOUTH DAILY (Patient taking differently: Take 20 mEq by mouth daily. )  . pravastatin (PRAVACHOL) 40 MG tablet TAKE 1 TABLET BY MOUTH EVERY EVENING (Patient taking differently: Take 40 mg by mouth every evening. )  . SYNTHROID 75 MCG tablet TAKE 1 TABLET BY MOUTH DAILY (Patient taking differently: Take 75 mcg by mouth daily before breakfast. )  . warfarin (JANTOVEN) 5 MG tablet TAKE 2 TABLETS EVERY DAY EXCEPT 1 TABLET ON SATURDAY (Patient taking differently: Take 5-10 mg by mouth See admin instructions. TAKE 2 TABLETS EVERY DAY EXCEPT 1 TABLET ON Saturday @ 8:30PM)     Allergies:   Penicillins, Aspirin, Fentanyl, and Niacin   Social History   Socioeconomic History  . Marital status: Widowed    Spouse name: Not on file  . Number of children: 0  . Years of education: Not on file  . Highest education level: Not on file  Occupational History  . Occupation: retired     Fish farm manager: RETIRED  Tobacco Use  . Smoking status: Former Smoker    Packs/day: 0.75    Years: 40.00    Pack years: 30.00    Types: Cigarettes    Quit date: 03/08/1987    Years since quitting: 32.9  . Smokeless tobacco: Never Used  Substance and Sexual Activity  . Alcohol use: No    Alcohol/week: 0.0 standard drinks  . Drug use: No  . Sexual activity: Not Currently  Other Topics Concern  . Not on file  Social History Narrative   From [place] (2004)   Social Determinants of Health   Financial Resource Strain:   . Difficulty of Paying Living Expenses:   Food Insecurity:   . Worried About Charity fundraiser in the Last Year:   . Arboriculturist in the Last Year:   Transportation Needs:   . Film/video editor (Medical):   Marland Kitchen Lack of Transportation (Non-Medical):   Physical Activity:   . Days of Exercise per Week:   . Minutes of Exercise per Session:   Stress:   . Feeling of Stress :   Social Connections:   . Frequency of Communication with Friends and Family:   . Frequency of Social Gatherings with Friends and Family:   . Attends Religious Services:   . Active Member of Clubs or Organizations:   . Attends Archivist Meetings:   Marland Kitchen Marital Status:      Family History: The patient's family history includes Diabetes in her brother and brother; Heart attack in her sister; Heart disease in her brother, brother, father, and sister; Hypertension in her brother; Lung cancer in her brother; Pancreatic cancer in her mother.  ROS:   Please see the history of present illness.     All other systems reviewed and are negative.  EKGs/Labs/Other Studies Reviewed:    The following studies were reviewed today:  Echo 01/31/2020 IMPRESSIONS    1. Left ventricular ejection fraction, by estimation, is 30 to 35%. The  left ventricle has moderately decreased function. The left ventricle  demonstrates global hypokinesis. There is mild concentric left ventricular  hypertrophy.  Left ventricular  diastolic function could not be evaluated.  2. Right ventricular systolic function is normal. The right ventricular  size is normal. There is severely elevated pulmonary artery systolic  pressure. The estimated right ventricular systolic pressure is 01.7 mmHg.  3. Left atrial size was severely dilated.  4. Right atrial size was moderately dilated.  5. The mitral valve has been repaired/replaced. No evidence of mitral  valve regurgitation.  6. The aortic valve has been repaired/replaced. Aortic valve  regurgitation is not visualized.   EKG:  EKG is ordered today.  The ekg ordered today demonstrates ventricularly paced rhythm, atrial flutter  Recent Labs: 01/30/2020: B Natriuretic Peptide 778.2 02/03/2020: Magnesium 2.1 02/16/2020: ALT 16; BUN 50; Creat 1.72; Potassium 5.1; Sodium 134; TSH 2.32 02/18/2020: Hemoglobin 9.1; Platelets 175  Recent Lipid Panel    Component Value Date/Time   CHOL 124 01/05/2020 1253   CHOL 165 10/16/2016 1213   TRIG 73 01/05/2020 1253   HDL 44 (L) 01/05/2020 1253   HDL 31 (L) 10/16/2016 1213   CHOLHDL 2.8 01/05/2020 1253   VLDL 12 07/07/2019 1354   LDLCALC 65 01/05/2020 1253    Physical Exam:    VS:  BP (!) 145/70   Pulse 60   Temp (!) 97.3 F (36.3 C)   Ht 5\' 7"  (1.702 m)   Wt 189 lb (85.7 kg)   SpO2 90%   BMI 29.60 kg/m     Wt Readings from Last 3 Encounters:  02/20/20 189 lb (85.7 kg)  02/18/20 190 lb (86.2 kg)  02/16/20 190 lb (86.2 kg)     GEN:  Well nourished, well developed in no acute distress HEENT: Normal NECK: No JVD; No carotid bruits LYMPHATICS: No lymphadenopathy CARDIAC: RRR, no murmurs, rubs, gallops RESPIRATORY:  Clear to auscultation without rales, wheezing or rhonchi  ABDOMEN: Soft, non-tender, non-distended MUSCULOSKELETAL:  No edema; No deformity  SKIN: Warm and dry NEUROLOGIC:  Alert and oriented x 3 PSYCHIATRIC:  Normal affect   ASSESSMENT:    1. Persistent atrial fibrillation ([place])     2. Biventricular pacemaker reprogramming   3. Chronic combined systolic (congestive) and diastolic (congestive) heart failure ([place])   4. Hyperlipidemia LDL goal <100   5. Controlled type 2 diabetes mellitus without complication, without long-term current use of insulin ([place])   6. Hx of aortic valve replacement, mechanical   7. H/O mitral valve replacement with mechanical valve    PLAN:    In order of problems listed above:  1. Persistent atrial fibrillation: On metoprolol and Coumadin.  Rate controlled  2. History of biventricular pacemaker: Reprogramming was performed during today's visit.   3. Chronic combined systolic and diastolic heart failure: Recent echocardiogram demonstrated a drop in the ejection fraction down to 30 to 35%.  Unclear if this drop in the ejection fraction is related to rising LV threshold and the intermittent non-capture from the device.  Otherwise, she is euvolemic on the current therapy and does not have any exertional chest discomfort.  She does not do much activity and can only get around using a walker.  Functional ability is quite limited.  4. Hyperlipidemia: On pravastatin  5. DM2: Managed by primary care provider  6. History of aortic valve replacement with mechanical valve:  On Coumadin  7. History of mitral valve replacement with mechanical valve: Stable on last echocardiogram.  Continue Coumadin therapy.   Medication Adjustments/Labs and Tests Ordered: Current medicines are reviewed at length with the patient today.  Concerns regarding medicines are outlined above.  Orders Placed This Encounter  Procedures  . EKG 12-Lead   No orders of the defined types were placed in this encounter.   Patient Instructions  Medication Instructions:  NONE ordered at this time of appointment   *If you need a refill on your cardiac medications before your next appointment, please call your pharmacy*  Lab Work: NONE ordered at this time of appointment   If  you have labs (blood work) drawn today and your tests are completely normal, you will receive your results only by: Marland Kitchen MyChart Message (if you have MyChart) OR . A paper copy in the mail If you have any lab test that is abnormal or we need to change your treatment, we will call you to review the results.  Testing/Procedures: NONE ordered at this time of appointment   Follow-Up: At [facility], you and your health needs are our priority.  As part of our continuing mission to provide you with exceptional heart care, we have created designated Provider Care Teams.  These Care Teams include your primary Cardiologist (physician) and Advanced Practice Providers (APPs -  Physician Assistants and Nurse Practitioners) who all work together to provide you with the care you need, when you need it.  We recommend signing up for the patient portal called "MyChart".  Sign up information is provided on this After Visit Summary.  MyChart is used to connect with patients for Virtual Visits (Telemedicine).  Patients are able to view lab/test results, encounter notes, upcoming appointments, etc.  Non-urgent messages can be sent to your provider as well.   To learn more about what you can do with MyChart, go to [email or web address].    Your next appointment:   1 month(s) 3 month(s)  The format for your next appointment:   In Person In Person  Provider:   Almyra Deforest, PA-C Sanda Klein, MD  Other Instructions      Signed, Almyra Deforest, [place]  02/22/2020 11:42 PM    [place]

## 2020-02-22 ENCOUNTER — Encounter: Payer: Self-pay | Admitting: Physician Assistant

## 2020-02-26 ENCOUNTER — Ambulatory Visit (INDEPENDENT_AMBULATORY_CARE_PROVIDER_SITE_OTHER): Payer: Medicare Other | Admitting: Family Medicine

## 2020-02-26 ENCOUNTER — Other Ambulatory Visit: Payer: Self-pay

## 2020-02-26 ENCOUNTER — Encounter: Payer: Self-pay | Admitting: Family Medicine

## 2020-02-26 VITALS — BP 185/89 | HR 60 | Temp 97.6°F | Ht 67.0 in | Wt 183.0 lb

## 2020-02-26 DIAGNOSIS — I10 Essential (primary) hypertension: Secondary | ICD-10-CM

## 2020-02-26 DIAGNOSIS — M1711 Unilateral primary osteoarthritis, right knee: Secondary | ICD-10-CM | POA: Diagnosis not present

## 2020-02-26 MED ORDER — PREDNISONE 10 MG (21) PO TBPK: ORAL_TABLET | ORAL | 0 refills | Status: DC

## 2020-02-26 MED ORDER — METHYLPREDNISOLONE ACETATE 80 MG/ML IJ SUSP
80.0000 mg | Freq: Once | INTRAMUSCULAR | Status: AC
  Administered 2020-02-26: 80 mg via INTRAMUSCULAR

## 2020-02-26 NOTE — Assessment & Plan Note (Signed)
Severe osteoarthritis of the knee.  Impaired mobility.  High fall risk. Would benefit from physical therapy.  We will get this ordered.  Additionally injection of prednisone and short course of prednisone provided today.  And referral for second opinion on possible treatment options.

## 2020-02-26 NOTE — Assessment & Plan Note (Signed)
Does not have demonstrated control today.  Previously demonstrated control in April.  Reports she is not taking her Imdur.  Taking her fluid medications.  Encourage salt reduction.  Limited ability for exercise.  Encouraged to continue fluid restriction and fluid medication.

## 2020-02-26 NOTE — Patient Instructions (Addendum)
I appreciate the opportunity to provide you with care for your health and wellness. Today we discussed: right knee pain   Follow up: 3 months in office with Dr [name]   No labs   Referral for second opinion for Right Knee pain -Ortho  Injection today to help with pain and short course of prednisone to help as well  Please continue to practice social distancing to keep you, your family, and our community safe.  If you must go out, please wear a mask and practice good handwashing.  It was a pleasure to see you and I look forward to continuing to work together on your health and well-being. Please do not hesitate to call the office if you need care or have questions about your care.  Have a wonderful day and week. With Gratitude, Cherly Beach, DNP, AGNP-BC

## 2020-02-26 NOTE — Progress Notes (Signed)
Subjective:  Patient ID: [name] [name], female    DOB: 09-25-1932  Age: 86 y.o. MRN: [id number]  CC:  Chief Complaint  Patient presents with  . Knee Pain    right knee pain that been going on for a long time but worsen in the past month      HPI  HPI   [name] [name] is a 86 year old female patient of [name] [name].  Presents today with right knee pain worsening over the last month. Has been seen by Dr. Luna [name] in the past.  For injections per her.  Reports that they were not beneficial for her.  Does not want to go back to Dr. Luna [name] and is willing to get a second opinion.  In her chart it is noted that she has severe osteoarthritis with limited mobility.  She presents today in a wheelchair. Pain is a 10/10.  It is willing to get injection today.  Has limited kidney function and allergy to ASA that limits toradol or other NSAID use.  Today patient denies signs and symptoms of COVID 19 infection including fever, chills, cough, shortness of breath, and headache. Past Medical, Surgical, Social History, Allergies, and Medications have been Reviewed.   Past Medical History:  Diagnosis Date  . Acute on chronic diastolic CHF (congestive heart failure) ([place]) 05/19/2012  . Acute on chronic respiratory failure with hypoxia ([place]) 01/31/2020  . Adenomatous polyp of colon 12/16/2002   [name] [name]/[facility]  . Allergy   . Calculus of gallbladder with chronic cholecystitis without obstruction 02/09/2009   Qualifier: Diagnosis of  By: [name], [name]    . Cataract   . CHB (complete heart block) ([place]) 10/07/2014      . CHF (congestive heart failure) ([place])    a.  EF previously reduced to 25-30% b. EF improved to 60-65% by echo in 10/2017.   Marland Kitchen Chronic kidney disease, stage I 2006  . Constipation       . COPD (chronic obstructive pulmonary disease) ([place])       . DJD (degenerative joint disease) of lumbar spine   . DM (diabetes mellitus) ([place]) 2006  . Esophageal  reflux   . Glaucoma   . Gout       . H/O adenomatous polyp of colon 05/24/2012   2004 in [place] colonoscopy 2008 normal    . H/O heart valve replacement with mechanical valve    a. s/p mechanical AVR in 2001 and mechanical MVR in 2004.  Marland Kitchen H/O wisdom tooth extraction   . HIP PAIN, RIGHT 04/23/2009   Qualifier: Diagnosis of  By: [name], [name]    . Hyperlipemia   . IDA (iron deficiency anemia)    Parenteral iron/[name]  . Insomnia       . Nausea without vomiting 06/07/2010   Qualifier: Diagnosis of  By: [name], [name]    . Non-ischemic cardiomyopathy ([place])    a. s/p MDT CRTD  . Obesity, unspecified   . Other and unspecified hyperlipidemia   . Oxygen deficiency 2014   nocturnal;  . Papilloma of breast 03/06/2012   This was diagnosed as a left breast papilloma by ultrasound characteristics and symptoms in 2010. Because of possible medical risk factors no surgical intervention has been done and it did doing annual followups. The area has been stable by physical examination and mammograms and ultrasound since 2010.   Marland Kitchen Spinal stenosis of lumbar region 02/19/2013  . Spondylolisthesis   .  Spondylosis   . Thyroid cancer ([place])    remote thyroidectomy, no recurrence, pt denies in 2016 thyroid cancer  . Type 2 diabetes mellitus with hyperglycemia ([place]) 07/15/2010  . Unspecified hypothyroidism       . 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 Vertigo         Current Meds  Medication Sig  . acetaminophen (TYLENOL) 500 MG tablet Take 500 mg by mouth every 6 (six) hours as needed (pain).  Marland Kitchen albuterol (PROVENTIL) (2.5 MG/3ML) 0.083% nebulizer solution 90INHALE 1 VIAL VIA NEBULIZER THREE TIMES DAILY. (Patient taking differently: Take 2.5 mg by nebulization in the morning and at bedtime. *May use additional one time if needed for shortness of breath)  . albuterol (VENTOLIN HFA) 108 (90 Base) MCG/ACT inhaler INHALE 2 PUFFS INTO THE LUNGS EVERY 6  HOURS AS NEEDED FOR WHEEZING OR SHORTNESS OF BREATH (Patient taking differently: Inhale 1-2 puffs into the lungs every 6 (six) hours as needed for wheezing or shortness of breath. )  . allopurinol (ZYLOPRIM) 100 MG tablet TAKE 1 TABLET(100 MG) BY MOUTH DAILY (Patient taking differently: Take 100 mg by mouth in the morning. )  . BREO ELLIPTA 100-25 MCG/INH AEPB 1 TAKE 1 INHALATION BY MOUTH ONCE DAILY (Patient taking differently: Inhale 1 puff into the lungs in the morning. )  . cholecalciferol (VITAMIN D3) 25 MCG (1000 UT) tablet Take 1,000 Units by mouth in the morning.   . docusate sodium (COLACE) 100 MG capsule Take 300 mg by mouth at bedtime.   . fluticasone (FLONASE) 50 MCG/ACT nasal spray SHAKE LIQUID AND USE 2 SPRAYS IN EACH NOSTRIL DAILY (Patient taking differently: Place 2 sprays into both nostrils every morning. )  . folic acid (FOLVITE) 1 MG tablet TAKE 1 TABLET(1 MG) BY MOUTH DAILY (Patient taking differently: Take 1 mg by mouth every evening. )  . furosemide (LASIX) 40 MG tablet Take 1 tablet (40 mg total) by mouth daily. (Patient taking differently: Take 40 mg by mouth every morning. )  . gabapentin (NEURONTIN) 300 MG capsule Take 1 capsule (300 mg total) by mouth 2 (two) times daily. (Patient taking differently: Take 300 mg by mouth at bedtime. )  . hydrALAZINE (APRESOLINE) 25 MG tablet Take 1 tablet (25 mg total) by mouth 2 (two) times daily.  . isosorbide mononitrate (IMDUR) 30 MG 24 hr tablet Take 0.5 tablets (15 mg total) by mouth every morning.  . Liniments (SALONPAS PAIN RELIEF PATCH EX) Apply 1 patch topically daily as needed (pain).  . meclizine (ANTIVERT) 12.5 MG tablet Take 1 tablet (12.5 mg total) by mouth 3 (three) times daily as needed for dizziness.  . metolazone (ZAROXOLYN) 2.5 MG tablet Take one tablet on Saturdays. (Patient taking differently: Take 2.5 mg by mouth every Saturday. )  . metoprolol succinate (TOPROL-XL) 100 MG 24 hr tablet TAKE 1 TABLET BY MOUTH EVERY DAY  WITH FOOD OR MILK (Patient taking differently: Take 100 mg by mouth every evening. )  . Multiple Vitamin (MULTIVITAMIN) LIQD Take 5 mLs by mouth daily.  . nitroGLYCERIN (NITROSTAT) 0.4 MG SL tablet Place 1 tablet (0.4 mg total) under the tongue every 5 (five) minutes as needed for chest pain.  Marland Kitchen ondansetron (ZOFRAN ODT) 4 MG disintegrating tablet Take 1 tablet (4 mg total) by mouth every 8 (eight) hours as needed for nausea or vomiting.  [name] [name] (SYSTANE) 0.4-0.3 % GEL ophthalmic gel Place 1 application into the left eye at bedtime.  Marland Kitchen  polyethylene glycol powder (GLYCOLAX/MIRALAX) 17 GM/SCOOP powder Take 17 g by mouth daily as needed for mild constipation or moderate constipation.  . potassium chloride SA (KLOR-CON) 20 MEQ tablet TAKE 1 TABLET(20 MEQ) BY MOUTH DAILY (Patient taking differently: Take 20 mEq by mouth daily. )  . pravastatin (PRAVACHOL) 40 MG tablet TAKE 1 TABLET BY MOUTH EVERY EVENING (Patient taking differently: Take 40 mg by mouth every evening. )  . SYNTHROID 75 MCG tablet TAKE 1 TABLET BY MOUTH DAILY (Patient taking differently: Take 75 mcg by mouth daily before breakfast. )  . warfarin (JANTOVEN) 5 MG tablet TAKE 2 TABLETS EVERY DAY EXCEPT 1 TABLET ON SATURDAY (Patient taking differently: Take 5-10 mg by mouth See admin instructions. TAKE 2 TABLETS EVERY DAY EXCEPT 1 TABLET ON Saturday @ 8:30PM)   Current Facility-Administered Medications for the 02/26/20 encounter (Office Visit) with Perlie Mayo, NP  Medication  . methylPREDNISolone acetate (DEPO-MEDROL) injection 80 mg    ROS:  Review of Systems  Constitutional: Negative.   HENT: Negative.   Eyes: Negative.   Respiratory: Negative.   Cardiovascular: Negative.   Gastrointestinal: Negative.   Genitourinary: Negative.   Musculoskeletal: Positive for joint pain.  Skin: Negative.   Neurological: Negative.   Endo/Heme/Allergies: Negative.   Psychiatric/Behavioral: Negative.   All other systems  reviewed and are negative.    Objective:   Today's Vitals: BP (!) 185/89 (BP Location: Right Arm, Patient Position: Sitting, Cuff Size: Normal)   Pulse 60   Temp 97.6 F (36.4 C) (Temporal)   Ht 5\' 7"  (1.702 m)   Wt 183 lb (83 kg)   SpO2 90%   BMI 28.66 kg/m  Vitals with BMI 02/26/2020 02/20/2020 02/18/2020  Height 5\' 7"  5\' 7"  5\' 7"   Weight 183 lbs 189 lbs 190 lbs  BMI 28.66 54.27 06.23  Systolic 762 831 517  Diastolic 89 70 64  Pulse 60 60 -     Physical Exam Vitals and nursing note reviewed.  Constitutional:      Appearance: Normal appearance. She is overweight.  HENT:     Head: Normocephalic and atraumatic.     Right Ear: External ear normal.     Left Ear: External ear normal.     Mouth/Throat:     Comments: Mask in place  Eyes:     General:        Right eye: No discharge.        Left eye: No discharge.     Conjunctiva/sclera: Conjunctivae normal.  Cardiovascular:     Rate and Rhythm: Normal rate and regular rhythm.     Pulses: Normal pulses.     Heart sounds: Normal heart sounds.  Pulmonary:     Effort: Pulmonary effort is normal.     Breath sounds: Normal breath sounds.  Musculoskeletal:     Cervical back: Normal range of motion and neck supple.     Comments: In wheelchair, limited ROM on right knee  Skin:    General: Skin is warm.  Neurological:     General: No focal deficit present.     Mental Status: She is alert and oriented to person, place, and time.  Psychiatric:        Mood and Affect: Mood normal.        Behavior: Behavior normal.        Thought Content: Thought content normal.        Judgment: Judgment normal.     Assessment   1. Primary osteoarthritis of right  knee   2. Essential hypertension     Tests ordered Orders Placed This Encounter  Procedures  . Ambulatory referral to Orthopedic Surgery     Plan: Please see assessment and plan per problem list above.   Meds ordered this encounter  Medications  . methylPREDNISolone  acetate (DEPO-MEDROL) injection 80 mg  . predniSONE (STERAPRED UNI-PAK 21 TAB) 10 MG (21) TBPK tablet    Sig: Take as directed    Dispense:  21 tablet    Refill:  0    Order Specific Question:   Supervising Provider    Answer:   Fayrene Helper [[id number]]    Patient to follow-up in 3 months with Dr [name]   Perlie Mayo, NP

## 2020-02-27 ENCOUNTER — Telehealth: Payer: Self-pay

## 2020-02-27 NOTE — Telephone Encounter (Signed)
Mailed Labs

## 2020-02-28 NOTE — Progress Notes (Signed)
Next download 6/21. Thanks

## 2020-03-01 ENCOUNTER — Other Ambulatory Visit: Payer: Self-pay | Admitting: Family Medicine

## 2020-03-01 ENCOUNTER — Telehealth: Payer: Self-pay

## 2020-03-01 NOTE — Telephone Encounter (Signed)
Pt needs someone to call her regarding Therapy

## 2020-03-01 NOTE — Telephone Encounter (Signed)
Pt was just seen 02-26-20 with injections but said she [facility] and wanted to know if this could be ordered

## 2020-03-02 ENCOUNTER — Other Ambulatory Visit: Payer: Self-pay | Admitting: *Deleted

## 2020-03-02 DIAGNOSIS — R2681 Unsteadiness on feet: Secondary | ICD-10-CM

## 2020-03-02 NOTE — Telephone Encounter (Signed)
Physical Therapy [facility] to [facility]

## 2020-03-02 NOTE — Telephone Encounter (Signed)
Yes, please place this for her.

## 2020-03-04 DIAGNOSIS — D631 Anemia in chronic kidney disease: Secondary | ICD-10-CM | POA: Diagnosis not present

## 2020-03-04 DIAGNOSIS — E211 Secondary hyperparathyroidism, not elsewhere classified: Secondary | ICD-10-CM | POA: Diagnosis not present

## 2020-03-04 DIAGNOSIS — N1832 Chronic kidney disease, stage 3b: Secondary | ICD-10-CM | POA: Diagnosis not present

## 2020-03-04 DIAGNOSIS — I5032 Chronic diastolic (congestive) heart failure: Secondary | ICD-10-CM | POA: Diagnosis not present

## 2020-03-04 DIAGNOSIS — N189 Chronic kidney disease, unspecified: Secondary | ICD-10-CM | POA: Diagnosis not present

## 2020-03-04 DIAGNOSIS — R809 Proteinuria, unspecified: Secondary | ICD-10-CM | POA: Diagnosis not present

## 2020-03-04 DIAGNOSIS — Z79899 Other long term (current) drug therapy: Secondary | ICD-10-CM | POA: Diagnosis not present

## 2020-03-04 DIAGNOSIS — I129 Hypertensive chronic kidney disease with stage 1 through stage 4 chronic kidney disease, or unspecified chronic kidney disease: Secondary | ICD-10-CM | POA: Diagnosis not present

## 2020-03-05 DIAGNOSIS — K219 Gastro-esophageal reflux disease without esophagitis: Secondary | ICD-10-CM | POA: Diagnosis not present

## 2020-03-05 DIAGNOSIS — M1711 Unilateral primary osteoarthritis, right knee: Secondary | ICD-10-CM | POA: Diagnosis not present

## 2020-03-05 DIAGNOSIS — E669 Obesity, unspecified: Secondary | ICD-10-CM | POA: Diagnosis not present

## 2020-03-05 DIAGNOSIS — M48061 Spinal stenosis, lumbar region without neurogenic claudication: Secondary | ICD-10-CM | POA: Diagnosis not present

## 2020-03-05 DIAGNOSIS — M4310 Spondylolisthesis, site unspecified: Secondary | ICD-10-CM | POA: Diagnosis not present

## 2020-03-05 DIAGNOSIS — J449 Chronic obstructive pulmonary disease, unspecified: Secondary | ICD-10-CM | POA: Diagnosis not present

## 2020-03-05 DIAGNOSIS — M1030 Gout due to renal impairment, unspecified site: Secondary | ICD-10-CM | POA: Diagnosis not present

## 2020-03-05 DIAGNOSIS — E1122 Type 2 diabetes mellitus with diabetic chronic kidney disease: Secondary | ICD-10-CM | POA: Diagnosis not present

## 2020-03-05 DIAGNOSIS — I4891 Unspecified atrial fibrillation: Secondary | ICD-10-CM | POA: Diagnosis not present

## 2020-03-05 DIAGNOSIS — D509 Iron deficiency anemia, unspecified: Secondary | ICD-10-CM | POA: Diagnosis not present

## 2020-03-05 DIAGNOSIS — I5032 Chronic diastolic (congestive) heart failure: Secondary | ICD-10-CM | POA: Diagnosis not present

## 2020-03-05 DIAGNOSIS — J9621 Acute and chronic respiratory failure with hypoxia: Secondary | ICD-10-CM | POA: Diagnosis not present

## 2020-03-05 DIAGNOSIS — I83893 Varicose veins of bilateral lower extremities with other complications: Secondary | ICD-10-CM | POA: Diagnosis not present

## 2020-03-05 DIAGNOSIS — E1165 Type 2 diabetes mellitus with hyperglycemia: Secondary | ICD-10-CM | POA: Diagnosis not present

## 2020-03-05 DIAGNOSIS — E7849 Other hyperlipidemia: Secondary | ICD-10-CM | POA: Diagnosis not present

## 2020-03-05 DIAGNOSIS — G4700 Insomnia, unspecified: Secondary | ICD-10-CM | POA: Diagnosis not present

## 2020-03-05 DIAGNOSIS — I130 Hypertensive heart and chronic kidney disease with heart failure and stage 1 through stage 4 chronic kidney disease, or unspecified chronic kidney disease: Secondary | ICD-10-CM | POA: Diagnosis not present

## 2020-03-05 DIAGNOSIS — I428 Other cardiomyopathies: Secondary | ICD-10-CM | POA: Diagnosis not present

## 2020-03-05 DIAGNOSIS — H409 Unspecified glaucoma: Secondary | ICD-10-CM | POA: Diagnosis not present

## 2020-03-05 DIAGNOSIS — I442 Atrioventricular block, complete: Secondary | ICD-10-CM | POA: Diagnosis not present

## 2020-03-05 DIAGNOSIS — E890 Postprocedural hypothyroidism: Secondary | ICD-10-CM | POA: Diagnosis not present

## 2020-03-05 DIAGNOSIS — M47816 Spondylosis without myelopathy or radiculopathy, lumbar region: Secondary | ICD-10-CM | POA: Diagnosis not present

## 2020-03-05 DIAGNOSIS — Z6829 Body mass index (BMI) 29.0-29.9, adult: Secondary | ICD-10-CM | POA: Diagnosis not present

## 2020-03-05 DIAGNOSIS — N181 Chronic kidney disease, stage 1: Secondary | ICD-10-CM | POA: Diagnosis not present

## 2020-03-05 DIAGNOSIS — R2681 Unsteadiness on feet: Secondary | ICD-10-CM | POA: Diagnosis not present

## 2020-03-08 ENCOUNTER — Telehealth: Payer: Self-pay

## 2020-03-08 DIAGNOSIS — I5032 Chronic diastolic (congestive) heart failure: Secondary | ICD-10-CM | POA: Diagnosis not present

## 2020-03-08 DIAGNOSIS — J449 Chronic obstructive pulmonary disease, unspecified: Secondary | ICD-10-CM | POA: Diagnosis not present

## 2020-03-08 DIAGNOSIS — M1711 Unilateral primary osteoarthritis, right knee: Secondary | ICD-10-CM | POA: Diagnosis not present

## 2020-03-08 DIAGNOSIS — J9621 Acute and chronic respiratory failure with hypoxia: Secondary | ICD-10-CM | POA: Diagnosis not present

## 2020-03-08 DIAGNOSIS — R2681 Unsteadiness on feet: Secondary | ICD-10-CM | POA: Diagnosis not present

## 2020-03-08 DIAGNOSIS — I130 Hypertensive heart and chronic kidney disease with heart failure and stage 1 through stage 4 chronic kidney disease, or unspecified chronic kidney disease: Secondary | ICD-10-CM | POA: Diagnosis not present

## 2020-03-08 NOTE — Telephone Encounter (Signed)
Attempted return call to patient as requested by voice mail message that stated she needed the number to Dr Tanna Furry office.  No answer or answering machine.

## 2020-03-10 ENCOUNTER — Ambulatory Visit: Payer: Medicare Other | Admitting: Orthopaedic Surgery

## 2020-03-10 ENCOUNTER — Other Ambulatory Visit: Payer: Self-pay

## 2020-03-10 ENCOUNTER — Telehealth: Payer: Self-pay

## 2020-03-10 DIAGNOSIS — R2681 Unsteadiness on feet: Secondary | ICD-10-CM | POA: Diagnosis not present

## 2020-03-10 DIAGNOSIS — I5032 Chronic diastolic (congestive) heart failure: Secondary | ICD-10-CM | POA: Diagnosis not present

## 2020-03-10 DIAGNOSIS — J449 Chronic obstructive pulmonary disease, unspecified: Secondary | ICD-10-CM | POA: Diagnosis not present

## 2020-03-10 DIAGNOSIS — M1711 Unilateral primary osteoarthritis, right knee: Secondary | ICD-10-CM | POA: Diagnosis not present

## 2020-03-10 DIAGNOSIS — J9621 Acute and chronic respiratory failure with hypoxia: Secondary | ICD-10-CM | POA: Diagnosis not present

## 2020-03-10 DIAGNOSIS — I130 Hypertensive heart and chronic kidney disease with heart failure and stage 1 through stage 4 chronic kidney disease, or unspecified chronic kidney disease: Secondary | ICD-10-CM | POA: Diagnosis not present

## 2020-03-10 MED ORDER — GABAPENTIN 300 MG PO CAPS: 300.0000 mg | ORAL_CAPSULE | Freq: Two times a day (BID) | ORAL | 3 refills | Status: DC

## 2020-03-10 NOTE — Telephone Encounter (Signed)
PT LVM that she is out of gabapentin

## 2020-03-10 NOTE — Telephone Encounter (Signed)
Refill sent.

## 2020-03-11 DIAGNOSIS — I129 Hypertensive chronic kidney disease with stage 1 through stage 4 chronic kidney disease, or unspecified chronic kidney disease: Secondary | ICD-10-CM | POA: Diagnosis not present

## 2020-03-11 DIAGNOSIS — N1832 Chronic kidney disease, stage 3b: Secondary | ICD-10-CM | POA: Diagnosis not present

## 2020-03-11 DIAGNOSIS — R809 Proteinuria, unspecified: Secondary | ICD-10-CM | POA: Diagnosis not present

## 2020-03-11 DIAGNOSIS — N189 Chronic kidney disease, unspecified: Secondary | ICD-10-CM | POA: Diagnosis not present

## 2020-03-11 DIAGNOSIS — D631 Anemia in chronic kidney disease: Secondary | ICD-10-CM | POA: Diagnosis not present

## 2020-03-11 DIAGNOSIS — I5032 Chronic diastolic (congestive) heart failure: Secondary | ICD-10-CM | POA: Diagnosis not present

## 2020-03-11 LAB — CBC
HCT: 33 % — ABNORMAL LOW (ref 35.0–45.0)
Hemoglobin: 10.4 g/dL — ABNORMAL LOW (ref 11.7–15.5)
MCH: 29.7 pg (ref 27.0–33.0)
MCHC: 31.5 g/dL — ABNORMAL LOW (ref 32.0–36.0)
MCV: 94.3 fL (ref 80.0–100.0)
MPV: 11.4 fL (ref 7.5–12.5)
Platelets: 143 10*3/uL (ref 140–400)
RBC: 3.5 10*6/uL — ABNORMAL LOW (ref 3.80–5.10)
RDW: 15.6 % — ABNORMAL HIGH (ref 11.0–15.0)
WBC: 6.3 10*3/uL (ref 3.8–10.8)

## 2020-03-11 LAB — COMPLETE METABOLIC PANEL WITH GFR
AG Ratio: 1.1 (calc) (ref 1.0–2.5)
ALT: 20 U/L (ref 6–29)
AST: 24 U/L (ref 10–35)
Albumin: 3.5 g/dL — ABNORMAL LOW (ref 3.6–5.1)
Alkaline phosphatase (APISO): 43 U/L (ref 37–153)
BUN/Creatinine Ratio: 36 (calc) — ABNORMAL HIGH (ref 6–22)
BUN: 46 mg/dL — ABNORMAL HIGH (ref 7–25)
CO2: 30 mmol/L (ref 20–32)
Calcium: 8.9 mg/dL (ref 8.6–10.4)
Chloride: 99 mmol/L (ref 98–110)
Creat: 1.28 mg/dL — ABNORMAL HIGH (ref 0.60–0.88)
GFR, Est African American: 44 mL/min/{1.73_m2} — ABNORMAL LOW (ref >=60)
GFR, Est Non African American: 38 mL/min/{1.73_m2} — ABNORMAL LOW (ref >=60)
Globulin: 3.1 g/dL (calc) (ref 1.9–3.7)
Glucose, Bld: 82 mg/dL (ref 65–99)
Potassium: 4.5 mmol/L (ref 3.5–5.3)
Sodium: 137 mmol/L (ref 135–146)
Total Bilirubin: 0.5 mg/dL (ref 0.2–1.2)
Total Protein: 6.6 g/dL (ref 6.1–8.1)

## 2020-03-14 ENCOUNTER — Other Ambulatory Visit: Payer: Self-pay

## 2020-03-14 ENCOUNTER — Emergency Department (HOSPITAL_COMMUNITY)
Admission: EM | Admit: 2020-03-14 | Discharge: 2020-03-14 | Disposition: A | Discharge status: Home / Self Care | Payer: Medicare Other | Attending: Emergency Medicine | Admitting: Emergency Medicine

## 2020-03-14 DIAGNOSIS — F17210 Nicotine dependence, cigarettes, uncomplicated: Secondary | ICD-10-CM | POA: Insufficient documentation

## 2020-03-14 DIAGNOSIS — Z79899 Other long term (current) drug therapy: Secondary | ICD-10-CM | POA: Insufficient documentation

## 2020-03-14 DIAGNOSIS — Z7901 Long term (current) use of anticoagulants: Secondary | ICD-10-CM | POA: Diagnosis not present

## 2020-03-14 DIAGNOSIS — I10 Essential (primary) hypertension: Secondary | ICD-10-CM | POA: Diagnosis not present

## 2020-03-14 DIAGNOSIS — J449 Chronic obstructive pulmonary disease, unspecified: Secondary | ICD-10-CM | POA: Insufficient documentation

## 2020-03-14 DIAGNOSIS — Z7984 Long term (current) use of oral hypoglycemic drugs: Secondary | ICD-10-CM | POA: Diagnosis not present

## 2020-03-14 DIAGNOSIS — Z7982 Long term (current) use of aspirin: Secondary | ICD-10-CM | POA: Diagnosis not present

## 2020-03-14 DIAGNOSIS — R040 Epistaxis: Secondary | ICD-10-CM | POA: Insufficient documentation

## 2020-03-14 LAB — CBC WITH DIFFERENTIAL/PLATELET
Abs Immature Granulocytes: 0.03 10*3/uL (ref 0.00–0.07)
Basophils Absolute: 0 10*3/uL (ref 0.0–0.1)
Basophils Relative: 0 %
Eosinophils Absolute: 0 10*3/uL (ref 0.0–0.5)
Eosinophils Relative: 0 %
HCT: 31.7 % — ABNORMAL LOW (ref 36.0–46.0)
Hemoglobin: 9.8 g/dL — ABNORMAL LOW (ref 12.0–15.0)
Immature Granulocytes: 1 %
Lymphocytes Relative: 10 %
Lymphs Abs: 0.7 10*3/uL (ref 0.7–4.0)
MCH: 29.7 pg (ref 26.0–34.0)
MCHC: 30.9 g/dL (ref 30.0–36.0)
MCV: 96.1 fL (ref 80.0–100.0)
Monocytes Absolute: 0.4 10*3/uL (ref 0.1–1.0)
Monocytes Relative: 7 %
Neutro Abs: 5.4 10*3/uL (ref 1.7–7.7)
Neutrophils Relative %: 82 %
Platelets: 116 10*3/uL — ABNORMAL LOW (ref 150–400)
RBC: 3.3 MIL/uL — ABNORMAL LOW (ref 3.87–5.11)
RDW: 16.9 % — ABNORMAL HIGH (ref 11.5–15.5)
WBC: 6.5 10*3/uL (ref 4.0–10.5)
nRBC: 0 % (ref 0.0–0.2)

## 2020-03-14 LAB — BASIC METABOLIC PANEL
Anion gap: 8 (ref 5–15)
BUN: 41 mg/dL — ABNORMAL HIGH (ref 8–23)
CO2: 30 mmol/L (ref 22–32)
Calcium: 8.9 mg/dL (ref 8.9–10.3)
Chloride: 97 mmol/L — ABNORMAL LOW (ref 98–111)
Creatinine, Ser: 1.37 mg/dL — ABNORMAL HIGH (ref 0.44–1.00)
GFR calc Af Amer: 40 mL/min — ABNORMAL LOW (ref >60)
GFR calc non Af Amer: 35 mL/min — ABNORMAL LOW (ref >60)
Glucose, Bld: 96 mg/dL (ref 70–99)
Potassium: 5 mmol/L (ref 3.5–5.1)
Sodium: 135 mmol/L (ref 135–145)

## 2020-03-14 LAB — PROTIME-INR
INR: 2.2 — ABNORMAL HIGH (ref 0.8–1.2)
Prothrombin Time: 23.8 seconds — ABNORMAL HIGH (ref 11.4–15.2)

## 2020-03-14 MED ORDER — OXYMETAZOLINE HCL 0.05 % NA SOLN
1.0000 | Freq: Once | NASAL | Status: AC
  Administered 2020-03-14: 1 via NASAL
  Filled 2020-03-14: qty 30

## 2020-03-14 NOTE — ED Provider Notes (Signed)
[facility] EMERGENCY DEPARTMENT Provider Note   CSN: [id number] Arrival date & time: 03/14/20  [place]     History Chief Complaint  Patient presents with  . Epistaxis    [name] [name] is a 86 y.o. female.  Patient with a nosebleed today.  She is on Coumadin.  When she arrived to the ED she was no longer bleeding.  The history is provided by the patient. No language interpreter was used.  Epistaxis Location:  R nare Severity:  Mild Timing:  Intermittent Progression:  Resolved Chronicity:  New Context: anticoagulants   Relieved by:  Nothing Worsened by:  Nothing Ineffective treatments:  None tried Associated symptoms: no congestion, no cough and no headaches        Past Medical History:  Diagnosis Date  . Acute on chronic diastolic CHF (congestive heart failure) ([place]) 05/19/2012  . Acute on chronic respiratory failure with hypoxia ([place]) 01/31/2020  . Adenomatous polyp of colon 12/16/2002   Dr. [name]/[facility]  . Allergy   . Calculus of gallbladder with chronic cholecystitis without obstruction 02/09/2009   Qualifier: Diagnosis of  By: [name], [name]    . Cataract   . CHB (complete heart block) ([place]) 10/07/2014      . CHF (congestive heart failure) ([place])    a.  EF previously reduced to 25-30% b. EF improved to 60-65% by echo in 10/2017.   Marland Kitchen Chronic kidney disease, stage I 2006  . Constipation       . COPD (chronic obstructive pulmonary disease) ([place])       . DJD (degenerative joint disease) of lumbar spine   . DM (diabetes mellitus) ([place]) 2006  . Esophageal reflux   . Glaucoma   . Gout       . H/O adenomatous polyp of colon 05/24/2012   2004 in [place] colonoscopy 2008 normal    . H/O heart valve replacement with mechanical valve    a. s/p mechanical AVR in 2001 and mechanical MVR in 2004.  Marland Kitchen H/O wisdom tooth extraction   . HIP PAIN, RIGHT 04/23/2009   Qualifier: Diagnosis of  By: [name], [name]    .  Hyperlipemia   . IDA (iron deficiency anemia)    Parenteral iron/[name]  . Insomnia       . Nausea without vomiting 06/07/2010   Qualifier: Diagnosis of  By: [name], [name]    . Non-ischemic cardiomyopathy ([place])    a. s/p MDT CRTD  . Obesity, unspecified   . Other and unspecified hyperlipidemia   . Oxygen deficiency 2014   nocturnal;  . Papilloma of breast 03/06/2012   This was diagnosed as a left breast papilloma by ultrasound characteristics and symptoms in 2010. Because of possible medical risk factors no surgical intervention has been done and it did doing annual followups. The area has been stable by physical examination and mammograms and ultrasound since 2010.   Marland Kitchen Spinal stenosis of lumbar region 02/19/2013  . Spondylolisthesis   . Spondylosis   . Thyroid cancer ([place])    remote thyroidectomy, no recurrence, pt denies in 2016 thyroid cancer  . Type 2 diabetes mellitus with hyperglycemia ([place]) 07/15/2010  . Unspecified hypothyroidism       . 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 Vertigo         Patient Active Problem List   Diagnosis Date Noted  . Acute pain of left shoulder 01/15/2020  . AAA (  abdominal aortic aneurysm) without rupture ([place]) 01/14/2020  . COVID-19 virus detected 11/23/2019  . Dyspnea 05/30/2019  . Anemia 05/30/2019  . Gout   . Glaucoma   . GERD (gastroesophageal reflux disease)   . Physical debility 04/28/2019  . Encounter for examination following treatment at hospital 11/03/2018  . Hyponatremia 09/27/2018  . H/O mitral valve replacement with mechanical valve 08/02/2018  . Chronic right hip pain 04/16/2018  . Chronic systolic congestive heart failure ([place]) 11/15/2017  . Trochanteric bursitis, right hip 03/07/2017  . Persistent atrial fibrillation ([place])   . Nocturnal hypoxia 10/08/2016  . Dependence on nocturnal oxygen therapy 10/08/2016  . Hematuria 03/20/2016  . S/P ICD (internal cardiac defibrillator)  procedure 02/08/2015  . Presence of cardiac pacemaker 02/08/2015  . CHB (complete heart block) ([place]) 10/07/2014  . Fatigue 07/20/2014  . Absolute anemia 06/24/2014  . Osteopenia 02/10/2014  . Encounter for therapeutic drug monitoring 10/29/2013  . OA (osteoarthritis) of knee 02/19/2013  . Chronic pain of right knee 01/02/2013  . Hemolytic anemia ([place]) 09/24/2012  . High risk medication use 05/24/2012  . COPD (chronic obstructive pulmonary disease) ([place]) 05/19/2012  . Chronic anticoagulation, on coumadin for Mechanical AVR and MVR 04/01/2012  . Cardiorenal syndrome with renal failure 03/22/2012  . Hx of aortic valve replacement, mechanical 03/22/2012  . S/P MVR (mitral valve replacement) 03/22/2012  . Biventricular automatic implantable cardioverter defibrillator in situ 03/22/2012  . CKD (chronic kidney disease), stage IV ([place]) 03/22/2012  . Warfarin-induced coagulopathy ([place]) 03/21/2012  . Iron deficiency 10/04/2011  . Allergic rhinitis 02/14/2011  . Hypothyroidism 07/15/2010  . Mitral valve disease 07/15/2010  . Sinus node dysfunction ([place]) 07/15/2010  . VARICOSE VEINS LOWER EXTREMITIES W/OTH COMPS 02/09/2009  . Prediabetes 06/15/2008  . Hypothyroidism, postsurgical 02/24/2008  . Hyperlipidemia LDL goal <70 02/24/2008  . Essential hypertension 02/24/2008  . Non-ischemic cardiomyopathy with ICD 02/24/2008  . DEGENERATIVE JOINT DISEASE, SPINE 02/24/2008    Past Surgical History:  Procedure Laterality Date  . AORTIC AND MITRAL VALVE REPLACEMENT  2001  . BI-VENTRICULAR IMPLANTABLE CARDIOVERTER DEFIBRILLATOR UPGRADE N/A 01/18/2015   Procedure: BI-VENTRICULAR IMPLANTABLE CARDIOVERTER DEFIBRILLATOR UPGRADE;  Surgeon: Evans Lance, MD;  Location: [facility] CATH LAB;  Service: Cardiovascular;  Laterality: N/A;  . BIV ICD GENERATOR CHANGEOUT N/A 01/30/2019   Procedure: BIV ICD GENERATOR CHANGEOUT;  Surgeon: Evans Lance, MD;  Location: [place] CV LAB;  Service: Cardiovascular;   Laterality: N/A;  . CARDIAC CATHETERIZATION    . CARDIAC VALVE REPLACEMENT    . CARDIOVERSION N/A 11/13/2016   Procedure: CARDIOVERSION;  Surgeon: Sanda Klein, MD;  Location: [facility] ENDOSCOPY;  Service: Cardiovascular;  Laterality: N/A;  . COLONOSCOPY  06/12/2007   [place]ourk- Normal rectum/Normal colon  . COLONOSCOPY  12/16/02   small hemorrhoids  . COLONOSCOPY  06/20/2012   Procedure: COLONOSCOPY;  Surgeon: Daneil Dolin, MD;  Location: [facility] ENDO SUITE;  Service: Endoscopy;  Laterality: N/A;  10:45  . defibrillator implanted 2006    . DOPPLER ECHOCARDIOGRAPHY  2012  . DOPPLER ECHOCARDIOGRAPHY  05,06,07,08,09,2011  . ESOPHAGOGASTRODUODENOSCOPY  06/12/2007   Rourk- normal esophagus, small hiatal hernia, otherwise normal stomach, D1, D2  . EYE SURGERY Right 2005  . EYE SURGERY Left 2006  . ICD LEAD REMOVAL Left 02/08/2015   Procedure: ICD LEAD REMOVAL;  Surgeon: Evans Lance, MD;  Location: [facility] OR;  Service: Cardiovascular;  Laterality: Left;  "Will plan extraction and insertion of a BiV PM"  **Dr. Roxan Hockey backing up case**  . IMPLANTABLE CARDIOVERTER DEFIBRILLATOR (  ICD) GENERATOR CHANGE Left 02/08/2015   Procedure: ICD GENERATOR CHANGE;  Surgeon: Evans Lance, MD;  Location: [place];  Service: Cardiovascular;  Laterality: Left;  . INSERT / REPLACE / REMOVE PACEMAKER    . PACEMAKER INSERTION  May 2016  . pacemaker placed  2004  . right breast cyst removed     benign   . right cataract removed     2005  . TEE WITHOUT CARDIOVERSION N/A 11/13/2016   Procedure: TRANSESOPHAGEAL ECHOCARDIOGRAM (TEE);  Surgeon: Sanda Klein, MD;  Location: [facility] ENDOSCOPY;  Service: Cardiovascular;  Laterality: N/A;  . THYROIDECTOMY       OB History    Gravida      Para      Term      Preterm      AB      Living  0     SAB      TAB      Ectopic      Multiple      Live Births              Family History  Problem Relation Age of Onset  . Pancreatic cancer Mother   . Heart disease  Father   . Heart disease Sister   . Heart attack Sister   . Heart disease Brother   . Diabetes Brother   . Heart disease Brother   . Diabetes Brother   . Hypertension Brother   . Lung cancer Brother     Social History   Tobacco Use  . Smoking status: Former Smoker    Packs/day: 0.75    Years: 40.00    Pack years: 30.00    Types: Cigarettes    Quit date: 03/08/1987    Years since quitting: 33.0  . Smokeless tobacco: Never Used  Vaping Use  . Vaping Use: Never used  Substance Use Topics  . Alcohol use: No    Alcohol/week: 0.0 standard drinks  . Drug use: No    Home Medications Prior to Admission medications   Medication Sig Start Date End Date Taking? Authorizing Provider  acetaminophen (TYLENOL) 500 MG tablet Take 500 mg by mouth every 6 (six) hours as needed (pain).    [provider]  albuterol (PROVENTIL) (2.5 MG/3ML) 0.083% nebulizer solution 90INHALE 1 VIAL VIA NEBULIZER THREE TIMES DAILY. Patient taking differently: Take 2.5 mg by nebulization in the morning and at bedtime. *May use additional one time if needed for shortness of breath 01/12/20   Fayrene Helper, MD  albuterol (VENTOLIN HFA) 108 (90 Base) MCG/ACT inhaler INHALE 2 PUFFS INTO THE LUNGS EVERY 6 HOURS AS NEEDED FOR WHEEZING OR SHORTNESS OF BREATH Patient taking differently: Inhale 1-2 puffs into the lungs every 6 (six) hours as needed for wheezing or shortness of breath.  08/25/19   Fayrene Helper, MD  allopurinol (ZYLOPRIM) 100 MG tablet TAKE 1 TABLET(100 MG) BY MOUTH DAILY Patient taking differently: Take 100 mg by mouth in the morning.  12/30/19   Fayrene Helper, MD  BREO ELLIPTA 100-25 MCG/INH AEPB 1 TAKE 1 INHALATION BY MOUTH ONCE DAILY Patient taking differently: Inhale 1 puff into the lungs in the morning.  08/25/19   Fayrene Helper, MD  cholecalciferol (VITAMIN D3) 25 MCG (1000 UT) tablet Take 1,000 Units by mouth in the morning.     [provider]  docusate  sodium (COLACE) 100 MG capsule Take 300 mg by mouth at bedtime.     [provider]  fluticasone (FLONASE) 50 MCG/ACT nasal spray SHAKE LIQUID AND USE 2 SPRAYS IN EACH NOSTRIL DAILY Patient taking differently: Place 2 sprays into both nostrils every morning.  11/24/19   Fayrene Helper, MD  folic acid (FOLVITE) 1 MG tablet TAKE 1 TABLET(1 MG) BY MOUTH DAILY Patient taking differently: Take 1 mg by mouth every evening.  11/10/19   Fayrene Helper, MD  furosemide (LASIX) 40 MG tablet Take 1 tablet (40 mg total) by mouth daily. Patient taking differently: Take 40 mg by mouth every morning.  08/19/19   Fayrene Helper, MD  gabapentin (NEURONTIN) 300 MG capsule Take 1 capsule (300 mg total) by mouth 2 (two) times daily. 03/10/20   Fayrene Helper, MD  hydrALAZINE (APRESOLINE) 25 MG tablet Take 1 tablet (25 mg total) by mouth 2 (two) times daily. 05/19/19   Croitoru, Mihai, MD  isosorbide mononitrate (IMDUR) 30 MG 24 hr tablet Take 0.5 tablets (15 mg total) by mouth every morning. 02/04/20   Pokhrel, Corrie Mckusick, MD  Liniments (SALONPAS PAIN RELIEF PATCH EX) Apply 1 patch topically daily as needed (pain).    [provider]  meclizine (ANTIVERT) 12.5 MG tablet TAKE 1 TABLET(12.5 MG) BY MOUTH THREE TIMES DAILY AS NEEDED FOR DIZZINESS 03/01/20   Fayrene Helper, MD  metolazone (ZAROXOLYN) 2.5 MG tablet Take one tablet on Saturdays. Patient taking differently: Take 2.5 mg by mouth every Saturday.  11/24/19   Croitoru, Mihai, MD  metoprolol succinate (TOPROL-XL) 100 MG 24 hr tablet TAKE 1 TABLET BY MOUTH EVERY DAY WITH FOOD OR MILK Patient taking differently: Take 100 mg by mouth every evening.  11/17/19   Fayrene Helper, MD  Multiple Vitamin (MULTIVITAMIN) LIQD Take 5 mLs by mouth daily.    [provider]  nitroGLYCERIN (NITROSTAT) 0.4 MG SL tablet Place 1 tablet (0.4 mg total) under the tongue every 5 (five) minutes as needed for chest pain. 08/03/19   Rolland Porter, MD    ondansetron (ZOFRAN ODT) 4 MG disintegrating tablet Take 1 tablet (4 mg total) by mouth every 8 (eight) hours as needed for nausea or vomiting. 01/26/20   Fawze, Mina A, PA-C  Polyethyl [name] (SYSTANE) 0.4-0.3 % GEL ophthalmic gel Place 1 application into the left eye at bedtime.    [provider]  polyethylene glycol powder (GLYCOLAX/MIRALAX) 17 GM/SCOOP powder Take 17 g by mouth daily as needed for mild constipation or moderate constipation.    [provider]  potassium chloride SA (KLOR-CON) 20 MEQ tablet TAKE 1 TABLET(20 MEQ) BY MOUTH DAILY Patient taking differently: Take 20 mEq by mouth daily.  01/28/20   Fayrene Helper, MD  pravastatin (PRAVACHOL) 40 MG tablet TAKE 1 TABLET BY MOUTH EVERY EVENING Patient taking differently: Take 40 mg by mouth every evening.  11/10/19   Fayrene Helper, MD  predniSONE (STERAPRED UNI-PAK 21 TAB) 10 MG (21) TBPK tablet Take as directed 02/26/20   Perlie Mayo, NP  SYNTHROID 75 MCG tablet TAKE 1 TABLET BY MOUTH DAILY Patient taking differently: Take 75 mcg by mouth daily before breakfast.  10/27/19   Fayrene Helper, MD  warfarin (JANTOVEN) 5 MG tablet TAKE 2 TABLETS EVERY DAY EXCEPT 1 TABLET ON SATURDAY Patient taking differently: Take 5-10 mg by mouth See admin instructions. TAKE 2 TABLETS EVERY DAY EXCEPT 1 TABLET ON Saturday @ 8:30PM 11/10/19   Evans Lance, MD    Allergies    Penicillins, Aspirin, Fentanyl, and Niacin  Review of Systems  Review of Systems  Constitutional: Negative for appetite change and fatigue.  HENT: Positive for nosebleeds. Negative for congestion, ear discharge and sinus pressure.   Eyes: Negative for discharge.  Respiratory: Negative for cough.   Cardiovascular: Negative for chest pain.  Gastrointestinal: Negative for abdominal pain and diarrhea.  Genitourinary: Negative for frequency and hematuria.  Musculoskeletal: Negative for back pain.  Skin: Negative for rash.   Neurological: Negative for seizures and headaches.  Psychiatric/Behavioral: Negative for hallucinations.    Physical Exam Updated Vital Signs BP (!) 144/63   Pulse (!) 59   Temp 98.3 F (36.8 C) (Oral)   Resp 16   Ht 5\' 7"  (1.702 m)   Wt 83 kg   SpO2 97%   BMI 28.66 kg/m   Physical Exam Vitals and nursing note reviewed.  Constitutional:      Appearance: She is well-developed.  HENT:     Head: Normocephalic.     Nose:     Comments: Dried blood in both nostrils Eyes:     General: No scleral icterus.    Conjunctiva/sclera: Conjunctivae normal.  Neck:     Thyroid: No thyromegaly.  Cardiovascular:     Rate and Rhythm: Normal rate and regular rhythm.     Heart sounds: No murmur heard.  No friction rub. No gallop.   Pulmonary:     Breath sounds: No stridor. No wheezing or rales.  Chest:     Chest wall: No tenderness.  Abdominal:     General: There is no distension.     Tenderness: There is no abdominal tenderness. There is no rebound.  Musculoskeletal:        General: Normal range of motion.     Cervical back: Neck supple.  Lymphadenopathy:     Cervical: No cervical adenopathy.  Skin:    Findings: No erythema or rash.  Neurological:     Mental Status: She is alert and oriented to person, place, and time.     Motor: No abnormal muscle tone.     Coordination: Coordination normal.  Psychiatric:        Behavior: Behavior normal.     ED Results / Procedures / Treatments   Labs (all labs ordered are listed, but only abnormal results are displayed) Labs Reviewed  CBC WITH DIFFERENTIAL/PLATELET - Abnormal; Notable for the following components:      Result Value   RBC 3.30 (*)    Hemoglobin 9.8 (*)    HCT 31.7 (*)    RDW 16.9 (*)    Platelets 116 (*)    All other components within normal limits  BASIC METABOLIC PANEL - Abnormal; Notable for the following components:   Chloride 97 (*)    BUN 41 (*)    Creatinine, Ser 1.37 (*)    GFR calc non Af Amer 35 (*)     GFR calc Af Amer 40 (*)    All other components within normal limits  PROTIME-INR - Abnormal; Notable for the following components:   Prothrombin Time 23.8 (*)    INR 2.2 (*)    All other components within normal limits    EKG None  Radiology No results found.  Procedures Procedures (including critical care time)  Medications Ordered in ED Medications  oxymetazoline (AFRIN) 0.05 % nasal spray 1 spray (has no administration in time range)    ED Course  I have reviewed the triage vital signs and the nursing notes.  Pertinent labs & imaging results that were available during my  care of the patient were reviewed by me and considered in my medical decision making (see chart for details).    MDM Rules/Calculators/A&P                          Patient with a nosebleed that has resolved.  She is sent home with Afrin nasal spray and will follow up with PCP       This patient presents to the ED for concern of nosebleed, this involves an extensive number of treatment options, and is a complaint that carries with it a high risk of complications and morbidity.  The differential diagnosis includes 2 high anticoagulants   Lab Tests:   I Ordered, reviewed, and interpreted labs, which included CBC chemistries which showed anemia of 9.8 hemoglobin.  Patient also has a INR 2.2  Medicines ordered:   I ordered medication Afrin for nosebleed  Imaging Studies ordered:   Additional history obtained:   Additional history obtained from significant other  Previous records obtained and reviewed   Consultations Obtained:   Reevaluation:  After the interventions stated above, I reevaluated the patient and found improved  Critical Interventions:  .   Final Clinical Impression(s) / ED Diagnoses Final diagnoses:  Epistaxis    Rx / DC Orders ED Discharge Orders    None       Milton Ferguson, MD 03/15/20 1109

## 2020-03-14 NOTE — ED Triage Notes (Signed)
Pt c/o right side nose bleed that started about a hour prior to arrival to er, bleeding stopped at present, pt also c/o pain to left foot from hitting the bed last night,. Swelling noted to bilateral lower extremities.

## 2020-03-14 NOTE — Discharge Instructions (Addendum)
Follow-up with your family doctor this week 

## 2020-03-15 ENCOUNTER — Ambulatory Visit (INDEPENDENT_AMBULATORY_CARE_PROVIDER_SITE_OTHER): Payer: Medicare Other

## 2020-03-15 ENCOUNTER — Other Ambulatory Visit: Payer: Self-pay | Admitting: Family Medicine

## 2020-03-15 DIAGNOSIS — Z45018 Encounter for adjustment and management of other part of cardiac pacemaker: Secondary | ICD-10-CM

## 2020-03-15 DIAGNOSIS — N184 Chronic kidney disease, stage 4 (severe): Secondary | ICD-10-CM

## 2020-03-15 DIAGNOSIS — I5042 Chronic combined systolic (congestive) and diastolic (congestive) heart failure: Secondary | ICD-10-CM

## 2020-03-16 DIAGNOSIS — J9621 Acute and chronic respiratory failure with hypoxia: Secondary | ICD-10-CM | POA: Diagnosis not present

## 2020-03-16 DIAGNOSIS — J449 Chronic obstructive pulmonary disease, unspecified: Secondary | ICD-10-CM | POA: Diagnosis not present

## 2020-03-16 DIAGNOSIS — M1711 Unilateral primary osteoarthritis, right knee: Secondary | ICD-10-CM | POA: Diagnosis not present

## 2020-03-16 DIAGNOSIS — I5032 Chronic diastolic (congestive) heart failure: Secondary | ICD-10-CM | POA: Diagnosis not present

## 2020-03-16 DIAGNOSIS — I130 Hypertensive heart and chronic kidney disease with heart failure and stage 1 through stage 4 chronic kidney disease, or unspecified chronic kidney disease: Secondary | ICD-10-CM | POA: Diagnosis not present

## 2020-03-16 DIAGNOSIS — R2681 Unsteadiness on feet: Secondary | ICD-10-CM | POA: Diagnosis not present

## 2020-03-17 ENCOUNTER — Ambulatory Visit (HOSPITAL_COMMUNITY): Payer: Medicare Other

## 2020-03-17 ENCOUNTER — Ambulatory Visit (INDEPENDENT_AMBULATORY_CARE_PROVIDER_SITE_OTHER): Payer: Medicare Other | Admitting: *Deleted

## 2020-03-17 ENCOUNTER — Other Ambulatory Visit: Payer: Self-pay

## 2020-03-17 ENCOUNTER — Other Ambulatory Visit (HOSPITAL_COMMUNITY): Payer: Medicare Other

## 2020-03-17 DIAGNOSIS — Z5181 Encounter for therapeutic drug level monitoring: Secondary | ICD-10-CM

## 2020-03-17 DIAGNOSIS — Z952 Presence of prosthetic heart valve: Secondary | ICD-10-CM

## 2020-03-17 LAB — POCT INR: INR: 2.3 (ref 2.0–3.0)

## 2020-03-17 NOTE — Progress Notes (Signed)
EPIC Encounter for ICM Monitoring  Patient Name: [name] [name] is a 86 y.o. female Date: 03/17/2020 Primary Care Physican: Fayrene Helper, MD Primary Cardiologist:Croitoru Electrophysiologist:Taylor Bi-V Pacing:99.7% 03/17/2020 Weight:185.8lbs    Spoke with patient and reports feeling well at this time.  Denies fluid symptoms.    Optivol thoracic impedancenormal.  Prescribed:  Furosemide40 mg 1 tablet daily.   Metolazaone 2.5 mg Take 1tabletoncea weekonSaturdays.   Potassium 20 mEq take 1 tablet daily.  Labs: 03/14/2020 Creatinine 1.37, BUN 41, Potassium 5.0, Sodium 135, GFR 35-40 03/10/2020 Creatinine 1.28, BUN 46, Potassium 4.5, Sodium 137, GFR 38-44  02/16/2020 Creatinine 1.72, BUN 50, Potassium 5.1, Sodium 134, GFR 27-31  02/03/2020 Creatinine 1.58, BUN 51, Potassium 4.3, Sodium 129, GFR 30-34 02/02/2020 Creatinine 1.63, BUN 47, Potassium 4.5 Sodium 133, GFR 28-33 02/01/2020 Creatinine 1.53, BUN 36, Potassium 4.5 Sodium 137, GFR 31-36 A complete set of results can be found in Results Review.  Recommendations:No changes and encouraged to call if experiencing any fluid symptoms.  Follow-up plan: ICM clinic phone appointment on7/26/2021.91 day device clinic remote transmission8/20/2021.  Next office visit: 03/25/2020 with Almyra Deforest, PA.  Copy of ICM check sent to Dr.Taylor.  3 month ICM trend: 03/14/2020    1 Year ICM trend:       [name], RN 03/17/2020 12:32 PM

## 2020-03-17 NOTE — Patient Instructions (Signed)
Continue warfarin 2 tablets daily except 1 tablet on Tuesdays  Continue greens Recheck in 6 weeks

## 2020-03-18 ENCOUNTER — Inpatient Hospital Stay (HOSPITAL_COMMUNITY): Payer: Medicare Other | Attending: Hematology

## 2020-03-18 ENCOUNTER — Inpatient Hospital Stay (HOSPITAL_COMMUNITY): Payer: Medicare Other

## 2020-03-18 VITALS — BP 148/67 | HR 61 | Resp 18

## 2020-03-18 DIAGNOSIS — N184 Chronic kidney disease, stage 4 (severe): Secondary | ICD-10-CM

## 2020-03-18 DIAGNOSIS — E611 Iron deficiency: Secondary | ICD-10-CM

## 2020-03-18 DIAGNOSIS — D631 Anemia in chronic kidney disease: Secondary | ICD-10-CM | POA: Insufficient documentation

## 2020-03-18 DIAGNOSIS — D649 Anemia, unspecified: Secondary | ICD-10-CM

## 2020-03-18 LAB — CBC
HCT: 31.5 % — ABNORMAL LOW (ref 36.0–46.0)
Hemoglobin: 9.7 g/dL — ABNORMAL LOW (ref 12.0–15.0)
MCH: 29.4 pg (ref 26.0–34.0)
MCHC: 30.8 g/dL (ref 30.0–36.0)
MCV: 95.5 fL (ref 80.0–100.0)
Platelets: 125 10*3/uL — ABNORMAL LOW (ref 150–400)
RBC: 3.3 MIL/uL — ABNORMAL LOW (ref 3.87–5.11)
RDW: 16.4 % — ABNORMAL HIGH (ref 11.5–15.5)
WBC: 4.8 10*3/uL (ref 4.0–10.5)
nRBC: 0 % (ref 0.0–0.2)

## 2020-03-18 MED ORDER — EPOETIN ALFA-EPBX 40000 UNIT/ML IJ SOLN
40000.0000 [IU] | Freq: Once | INTRAMUSCULAR | Status: AC
  Administered 2020-03-18: 40000 [IU] via SUBCUTANEOUS

## 2020-03-18 MED ORDER — EPOETIN ALFA-EPBX 40000 UNIT/ML IJ SOLN
INTRAMUSCULAR | Status: AC
  Filled 2020-03-18: qty 1

## 2020-03-18 NOTE — Patient Instructions (Signed)
[facility] at [facility]  Discharge Instructions:  Epoetin Alfa injection What is this medicine? EPOETIN ALFA (e POE e tin AL fa) helps your body make more red blood cells. This medicine is used to treat anemia caused by chronic kidney disease, cancer chemotherapy, or HIV-therapy. It may also be used before surgery if you have anemia. This medicine may be used for other purposes; ask your health care provider or pharmacist if you have questions. COMMON BRAND NAME(S): Epogen, Procrit, Retacrit What should I tell my health care provider before I take this medicine? They need to know if you have any of these conditions:  cancer  heart disease  high blood pressure  history of blood clots  history of stroke  low levels of folate, iron, or vitamin B12 in the blood  seizures  an unusual or allergic reaction to erythropoietin, albumin, benzyl alcohol, hamster proteins, other medicines, foods, dyes, or preservatives  pregnant or trying to get pregnant  breast-feeding How should I use this medicine? This medicine is for injection into a vein or under the skin. It is usually given by a health care professional in a hospital or clinic setting. If you get this medicine at home, you will be taught how to prepare and give this medicine. Use exactly as directed. Take your medicine at regular intervals. Do not take your medicine more often than directed. It is important that you put your used needles and syringes in a special sharps container. Do not put them in a trash can. If you do not have a sharps container, call your pharmacist or healthcare provider to get one. A special MedGuide will be given to you by the pharmacist with each prescription and refill. Be sure to read this information carefully each time. Talk to your pediatrician regarding the use of this medicine in children. While this drug may be prescribed for selected conditions, precautions do  apply. Overdosage: If you think you have taken too much of this medicine contact a poison control center or emergency room at once. NOTE: This medicine is only for you. Do not share this medicine with others. What if I miss a dose? If you miss a dose, take it as soon as you can. If it is almost time for your next dose, take only that dose. Do not take double or extra doses. What may interact with this medicine? Interactions have not been studied. This list may not describe all possible interactions. Give your health care provider a list of all the medicines, herbs, non-prescription drugs, or dietary supplements you use. Also tell them if you smoke, drink alcohol, or use illegal drugs. Some items may interact with your medicine. What should I watch for while using this medicine? Your condition will be monitored carefully while you are receiving this medicine. You may need blood work done while you are taking this medicine. This medicine may cause a decrease in vitamin B6. You should make sure that you get enough vitamin B6 while you are taking this medicine. Discuss the foods you eat and the vitamins you take with your health care professional. What side effects may I notice from receiving this medicine? Side effects that you should report to your doctor or health care professional as soon as possible:  allergic reactions like skin rash, itching or hives, swelling of the face, lips, or tongue  seizures  signs and symptoms of a blood clot such as breathing problems; changes in vision; chest pain; severe, sudden   headache; pain, swelling, warmth in the leg; trouble speaking; sudden numbness or weakness of the face, arm or leg  signs and symptoms of a stroke like changes in vision; confusion; trouble speaking or understanding; severe headaches; sudden numbness or weakness of the face, arm or leg; trouble walking; dizziness; loss of balance or coordination Side effects that usually do not require  medical attention (report to your doctor or health care professional if they continue or are bothersome):  chills  cough  dizziness  fever  headaches  joint pain  muscle cramps  muscle pain  nausea, vomiting  pain, redness, or irritation at site where injected This list may not describe all possible side effects. Call your doctor for medical advice about side effects. You may report side effects to FDA at 1-800-FDA-1088. Where should I keep my medicine? Keep out of the reach of children. Store in a refrigerator between 2 and 8 degrees C (36 and 46 degrees F). Do not freeze or shake. Throw away any unused portion if using a single-dose vial. Multi-dose vials can be kept in the refrigerator for up to 21 days after the initial dose. Throw away unused medicine. NOTE: This sheet is a summary. It may not cover all possible information. If you have questions about this medicine, talk to your doctor, pharmacist, or health care provider.  2020 Elsevier/Gold Standard (2017-04-20 08:35:19)  _______________________________________________________________  Thank you for choosing [facility] at [facility] to provide your oncology and hematology care.  To afford each patient quality time with our providers, please arrive at least 15 minutes before your scheduled appointment.  You need to re-schedule your appointment if you arrive 10 or more minutes late.  We strive to give you quality time with our providers, and arriving late affects you and other patients whose appointments are after yours.  Also, if you no show three or more times for appointments you may be dismissed from the clinic.  Again, thank you for choosing [facility]. Our hope is that these requests will allow you access to exceptional care and in a timely manner. _______________________________________________________________  If you have questions after your visit, please  contact our office at [phone] between the hours of 8:30 a.m. and 5:00 p.m. Voicemails left after 4:30 p.m. will not be returned until the following business day. _______________________________________________________________  For prescription refill requests, have your pharmacy contact our office. _______________________________________________________________  Recommendations made by the consultant and any test results will be sent to your referring physician. _______________________________________________________________ 

## 2020-03-18 NOTE — Progress Notes (Signed)
[name] [name] presents today for Retacrit injection. Hemoglobin reviewed prior to administration. VSS. Injection tolerated without incident or complaint. See MAR for details. Patient discharged in satisfactory condition with follow up instructions.

## 2020-03-19 ENCOUNTER — Other Ambulatory Visit (HOSPITAL_COMMUNITY): Payer: Medicare Other

## 2020-03-19 ENCOUNTER — Ambulatory Visit (HOSPITAL_COMMUNITY): Payer: Medicare Other

## 2020-03-19 DIAGNOSIS — I5032 Chronic diastolic (congestive) heart failure: Secondary | ICD-10-CM | POA: Diagnosis not present

## 2020-03-19 DIAGNOSIS — J449 Chronic obstructive pulmonary disease, unspecified: Secondary | ICD-10-CM | POA: Diagnosis not present

## 2020-03-19 DIAGNOSIS — J9621 Acute and chronic respiratory failure with hypoxia: Secondary | ICD-10-CM | POA: Diagnosis not present

## 2020-03-19 DIAGNOSIS — M1711 Unilateral primary osteoarthritis, right knee: Secondary | ICD-10-CM | POA: Diagnosis not present

## 2020-03-19 DIAGNOSIS — I130 Hypertensive heart and chronic kidney disease with heart failure and stage 1 through stage 4 chronic kidney disease, or unspecified chronic kidney disease: Secondary | ICD-10-CM | POA: Diagnosis not present

## 2020-03-19 DIAGNOSIS — R2681 Unsteadiness on feet: Secondary | ICD-10-CM | POA: Diagnosis not present

## 2020-03-22 DIAGNOSIS — R2681 Unsteadiness on feet: Secondary | ICD-10-CM

## 2020-03-22 DIAGNOSIS — M1711 Unilateral primary osteoarthritis, right knee: Secondary | ICD-10-CM

## 2020-03-22 DIAGNOSIS — J9621 Acute and chronic respiratory failure with hypoxia: Secondary | ICD-10-CM | POA: Diagnosis not present

## 2020-03-22 DIAGNOSIS — I130 Hypertensive heart and chronic kidney disease with heart failure and stage 1 through stage 4 chronic kidney disease, or unspecified chronic kidney disease: Secondary | ICD-10-CM

## 2020-03-22 DIAGNOSIS — I5032 Chronic diastolic (congestive) heart failure: Secondary | ICD-10-CM

## 2020-03-22 DIAGNOSIS — E1122 Type 2 diabetes mellitus with diabetic chronic kidney disease: Secondary | ICD-10-CM

## 2020-03-22 DIAGNOSIS — M47816 Spondylosis without myelopathy or radiculopathy, lumbar region: Secondary | ICD-10-CM

## 2020-03-22 DIAGNOSIS — J449 Chronic obstructive pulmonary disease, unspecified: Secondary | ICD-10-CM

## 2020-03-22 DIAGNOSIS — M48061 Spinal stenosis, lumbar region without neurogenic claudication: Secondary | ICD-10-CM

## 2020-03-22 DIAGNOSIS — N181 Chronic kidney disease, stage 1: Secondary | ICD-10-CM

## 2020-03-23 ENCOUNTER — Ambulatory Visit (INDEPENDENT_AMBULATORY_CARE_PROVIDER_SITE_OTHER): Payer: Medicare Other | Admitting: Family Medicine

## 2020-03-23 ENCOUNTER — Encounter: Payer: Self-pay | Admitting: Family Medicine

## 2020-03-23 ENCOUNTER — Other Ambulatory Visit: Payer: Self-pay

## 2020-03-23 VITALS — BP 152/88 | HR 65 | Resp 17 | Ht 67.0 in | Wt 197.0 lb

## 2020-03-23 DIAGNOSIS — M479 Spondylosis, unspecified: Secondary | ICD-10-CM

## 2020-03-23 DIAGNOSIS — M1711 Unilateral primary osteoarthritis, right knee: Secondary | ICD-10-CM | POA: Diagnosis not present

## 2020-03-23 DIAGNOSIS — M7061 Trochanteric bursitis, right hip: Secondary | ICD-10-CM | POA: Diagnosis not present

## 2020-03-23 DIAGNOSIS — R7303 Prediabetes: Secondary | ICD-10-CM

## 2020-03-23 DIAGNOSIS — R040 Epistaxis: Secondary | ICD-10-CM

## 2020-03-23 DIAGNOSIS — Z09 Encounter for follow-up examination after completed treatment for conditions other than malignant neoplasm: Secondary | ICD-10-CM

## 2020-03-23 DIAGNOSIS — R5381 Other malaise: Secondary | ICD-10-CM | POA: Diagnosis not present

## 2020-03-23 DIAGNOSIS — I10 Essential (primary) hypertension: Secondary | ICD-10-CM

## 2020-03-23 LAB — POCT GLYCOSYLATED HEMOGLOBIN (HGB A1C)
HbA1c POC (<> result, manual entry): 5.8 % (ref 4.0–5.6)
HbA1c, POC (controlled diabetic range): 5.8 % (ref 0.0–7.0)
HbA1c, POC (prediabetic range): 5.8 % (ref 5.7–6.4)
Hemoglobin A1C: 5.8 % — AB (ref 4.0–5.6)

## 2020-03-23 NOTE — Patient Instructions (Addendum)
F/U as before, call if you need me sooner  We will try to get increased number of hours / day , and incrased number of days per week to 7 as you neeed help with all activities of daily living  Be careful not to fall, and try to restrict movement when no one is with you  You are referred to ENT for nose bleeding from right nostril  Thanks for choosing [facility], we consider it a privelige to serve you.

## 2020-03-24 DIAGNOSIS — J449 Chronic obstructive pulmonary disease, unspecified: Secondary | ICD-10-CM | POA: Diagnosis not present

## 2020-03-24 DIAGNOSIS — I5032 Chronic diastolic (congestive) heart failure: Secondary | ICD-10-CM | POA: Diagnosis not present

## 2020-03-24 DIAGNOSIS — M1711 Unilateral primary osteoarthritis, right knee: Secondary | ICD-10-CM | POA: Diagnosis not present

## 2020-03-24 DIAGNOSIS — J9621 Acute and chronic respiratory failure with hypoxia: Secondary | ICD-10-CM | POA: Diagnosis not present

## 2020-03-24 DIAGNOSIS — R2681 Unsteadiness on feet: Secondary | ICD-10-CM | POA: Diagnosis not present

## 2020-03-24 DIAGNOSIS — I130 Hypertensive heart and chronic kidney disease with heart failure and stage 1 through stage 4 chronic kidney disease, or unspecified chronic kidney disease: Secondary | ICD-10-CM | POA: Diagnosis not present

## 2020-03-25 ENCOUNTER — Other Ambulatory Visit: Payer: Self-pay

## 2020-03-25 ENCOUNTER — Encounter: Payer: Self-pay | Admitting: Physician Assistant

## 2020-03-25 ENCOUNTER — Ambulatory Visit (INDEPENDENT_AMBULATORY_CARE_PROVIDER_SITE_OTHER): Payer: Medicare Other | Admitting: Medical

## 2020-03-25 VITALS — BP 173/81 | HR 60 | Ht 67.0 in | Wt 197.0 lb

## 2020-03-25 DIAGNOSIS — I5042 Chronic combined systolic (congestive) and diastolic (congestive) heart failure: Secondary | ICD-10-CM

## 2020-03-25 DIAGNOSIS — I4819 Other persistent atrial fibrillation: Secondary | ICD-10-CM

## 2020-03-25 DIAGNOSIS — Z950 Presence of cardiac pacemaker: Secondary | ICD-10-CM | POA: Diagnosis not present

## 2020-03-25 DIAGNOSIS — I10 Essential (primary) hypertension: Secondary | ICD-10-CM

## 2020-03-25 NOTE — Patient Instructions (Addendum)
Medication Instructions:   TAKE an extra dose of Metolazone and of Potassium today  TAKE Lasix 30 minutes after taking metolazone and Potassium today  *If you need a refill on your cardiac medications before your next appointment, please call your pharmacy*  Lab Work: NONE ordered at this time of appointment   If you have labs (blood work) drawn today and your tests are completely normal, you will receive your results only by: Marland Kitchen MyChart Message (if you have MyChart) OR . A paper copy in the mail If you have any lab test that is abnormal or we need to change your treatment, we will call you to review the results.  Testing/Procedures: NONE ordered at this time of appointment   Follow-Up: At [facility], you and your health needs are our priority.  As part of our continuing mission to provide you with exceptional heart care, we have created designated Provider Care Teams.  These Care Teams include your primary Cardiologist (physician) and Advanced Practice Providers (APPs -  Physician Assistants and Nurse Practitioners) who all work together to provide you with the care you need, when you need it.  We recommend signing up for the patient portal called "MyChart".  Sign up information is provided on this After Visit Summary.  MyChart is used to connect with patients for Virtual Visits (Telemedicine).  Patients are able to view lab/test results, encounter notes, upcoming appointments, etc.  Non-urgent messages can be sent to your provider as well.   To learn more about what you can do with MyChart, go to [email or web address].    Your next appointment:   6 day(s)  The format for your next appointment:   In Person  Provider:   Jory Sims, DNP, ANP  Other Instructions

## 2020-03-25 NOTE — Progress Notes (Addendum)
Cardiology Office Note  Date:  03/25/2020   ID:  [name] [name], DOB 1932/02/10, MRN [id number]  PCP:  Fayrene Helper, MD  Cardiologist:  Dr. Recardo Evangelist  _____________  1 month follow-up  _____________   History of Present Illness: [name] [name] is a 86 y.o. female who presents today for 1 month follow-up. She has a history of persistent afib, CHB s/p Medtronic BiV pacemaker 01/30/19 by Dr. Ellwood Handler, COPD on home O2, HLD, DM2, hypothyroidism, chronic combined systolic and diastolic CHF, aortic and mitral replacement with mechanical valve. Patient underwent mechanical AVR in 2001 and mechanical MVR in 2004 in [place]. She has severe cardiomyopathy with EF down to 25% however EF has improved based on the last echo. IT was reported she had no CAD prior to her first valve replacement. Initial pacemaker was extracted in May 2016 and she received CRT-D device. After the implantation ICD reached elective replacement, patient underwent insertion of a new Medtroni BiV pacemaker by Dr. Eden Emms 01/30/19. Echo in June 2020 showed EF 55% with normal functioning bioprosthetic valve.   The patient was seen in the ED 01/26/20 for weakness and abd pain. Trop and BNP were mildly elevated. Pacemaker interrogation showed concern for LV threshold was elevated. Medtronic tech came out and adjusted the device. The patient was admitted again in May with heart failure and afib. EKG showed V-paced rhythm with possible underlying aflutter. Repeat echo showed decreased EF down to 35% with global hypokinesis and elevated PASP of 71.34mmHg. She underwent IV diuresis and was discharged on home lasix 40 mg and metolazone once weekly. No ARB/ACE due to kidney insufficiency. Concern of rising LV threshold with intermittent suspected noncapture noted.   The patient was seen in the office 5/28 where device transmission revealed LV output possible subthreshold. She was symptomatically stable. Device was reprogrammed. Weight was  189lbs.  Today, she presents for follow-up. She reports worsening lower leg edema for the last couple days. Also feels short of breath that is worse on exertion, even feels sob while talking. She is still at her baseline 2L O2 and has not felt the need to increase it. O2 saturation at 92 today. Reports she has been taking lasix and metolazone as prescribed.  Weight has gone up but is not sure how much, says weight is normally in the 180s and today is 197lbs. Admits to eating out more often. She denies chest pain, palpitations, orthopnea, PND.   Echo 01/31/2020 IMPRESSIONS  1. Left ventricular ejection fraction, by estimation, is 30 to 35%. The  left ventricle has moderately decreased function. The left ventricle  demonstrates global hypokinesis. There is mild concentric left ventricular  hypertrophy. Left ventricular  diastolic function could not be evaluated.  2. Right ventricular systolic function is normal. The right ventricular  size is normal. There is severely elevated pulmonary artery systolic  pressure. The estimated right ventricular systolic pressure is 48.1 mmHg.  3. Left atrial size was severely dilated.  4. Right atrial size was moderately dilated.  5. The mitral valve has been repaired/replaced. No evidence of mitral  valve regurgitation.  6. The aortic valve has been repaired/replaced. Aortic valve  regurgitation is not visualized.  _____________   Past Medical History:  Diagnosis Date  . Acute on chronic diastolic CHF (congestive heart failure) ([place]) 05/19/2012  . Acute on chronic respiratory failure with hypoxia ([place]) 01/31/2020  . Adenomatous polyp of colon 12/16/2002   Dr. [name]/[facility]  . Allergy   .  Calculus of gallbladder with chronic cholecystitis without obstruction 02/09/2009   Qualifier: Diagnosis of  By: [name], [name]    . Cataract   . CHB (complete heart block) ([place]) 10/07/2014      . CHF (congestive heart  failure) ([place])    a.  EF previously reduced to 25-30% b. EF improved to 60-65% by echo in 10/2017.   Marland Kitchen Chronic kidney disease, stage I 2006  . Constipation       . COPD (chronic obstructive pulmonary disease) ([place])       . DJD (degenerative joint disease) of lumbar spine   . DM (diabetes mellitus) ([place]) 2006  . Esophageal reflux   . Glaucoma   . Gout       . H/O adenomatous polyp of colon 05/24/2012   2004 in [place] colonoscopy 2008 normal    . H/O heart valve replacement with mechanical valve    a. s/p mechanical AVR in 2001 and mechanical MVR in 2004.  Marland Kitchen H/O wisdom tooth extraction   . HIP PAIN, RIGHT 04/23/2009   Qualifier: Diagnosis of  By: [name], [name]    . Hyperlipemia   . IDA (iron deficiency anemia)    Parenteral iron/[name]  . Insomnia       . Nausea without vomiting 06/07/2010   Qualifier: Diagnosis of  By: [name], [name]    . Non-ischemic cardiomyopathy ([place])    a. s/p MDT CRTD  . Obesity, unspecified   . Other and unspecified hyperlipidemia   . Oxygen deficiency 2014   nocturnal;  . Papilloma of breast 03/06/2012   This was diagnosed as a left breast papilloma by ultrasound characteristics and symptoms in 2010. Because of possible medical risk factors no surgical intervention has been done and it did doing annual followups. The area has been stable by physical examination and mammograms and ultrasound since 2010.   Marland Kitchen Spinal stenosis of lumbar region 02/19/2013  . Spondylolisthesis   . Spondylosis   . Thyroid cancer ([place])    remote thyroidectomy, no recurrence, pt denies in 2016 thyroid cancer  . Type 2 diabetes mellitus with hyperglycemia ([place]) 07/15/2010  . Unspecified hypothyroidism       . 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 Vertigo        Past Surgical History:  Procedure Laterality Date  . AORTIC AND MITRAL VALVE REPLACEMENT  2001  . BI-VENTRICULAR IMPLANTABLE CARDIOVERTER DEFIBRILLATOR UPGRADE N/A  01/18/2015   Procedure: BI-VENTRICULAR IMPLANTABLE CARDIOVERTER DEFIBRILLATOR UPGRADE;  Surgeon: Evans Lance, MD;  Location: [facility] CATH LAB;  Service: Cardiovascular;  Laterality: N/A;  . BIV ICD GENERATOR CHANGEOUT N/A 01/30/2019   Procedure: BIV ICD GENERATOR CHANGEOUT;  Surgeon: Evans Lance, MD;  Location: [place] CV LAB;  Service: Cardiovascular;  Laterality: N/A;  . CARDIAC CATHETERIZATION    . CARDIAC VALVE REPLACEMENT    . CARDIOVERSION N/A 11/13/2016   Procedure: CARDIOVERSION;  Surgeon: Sanda Klein, MD;  Location: [facility] ENDOSCOPY;  Service: Cardiovascular;  Laterality: N/A;  . COLONOSCOPY  06/12/2007   [place]k- Normal rectum/Normal colon  . COLONOSCOPY  12/16/02   small hemorrhoids  . COLONOSCOPY  06/20/2012   Procedure: COLONOSCOPY;  Surgeon: Daneil Dolin, MD;  Location: [facility] ENDO SUITE;  Service: Endoscopy;  Laterality: N/A;  10:45  . defibrillator implanted 2006    . DOPPLER ECHOCARDIOGRAPHY  2012  . DOPPLER ECHOCARDIOGRAPHY  05,06,07,08,09,2011  . ESOPHAGOGASTRODUODENOSCOPY  06/12/2007   Rourk- normal esophagus, small hiatal  hernia, otherwise normal stomach, D1, D2  . EYE SURGERY Right 2005  . EYE SURGERY Left 2006  . ICD LEAD REMOVAL Left 02/08/2015   Procedure: ICD LEAD REMOVAL;  Surgeon: Evans Lance, MD;  Location: [facility] OR;  Service: Cardiovascular;  Laterality: Left;  "Will plan extraction and insertion of a BiV PM"  **Dr. Roxan Hockey backing up case**  . IMPLANTABLE CARDIOVERTER DEFIBRILLATOR (ICD) GENERATOR CHANGE Left 02/08/2015   Procedure: ICD GENERATOR CHANGE;  Surgeon: Evans Lance, MD;  Location: [place];  Service: Cardiovascular;  Laterality: Left;  . INSERT / REPLACE / REMOVE PACEMAKER    . PACEMAKER INSERTION  May 2016  . pacemaker placed  2004  . right breast cyst removed     benign   . right cataract removed     2005  . TEE WITHOUT CARDIOVERSION N/A 11/13/2016   Procedure: TRANSESOPHAGEAL ECHOCARDIOGRAM (TEE);  Surgeon: Sanda Klein, MD;  Location: [facility]  ENDOSCOPY;  Service: Cardiovascular;  Laterality: N/A;  . THYROIDECTOMY     _____________  Current Outpatient Medications  Medication Sig Dispense Refill  . acetaminophen (TYLENOL) 500 MG tablet Take 500 mg by mouth every 6 (six) hours as needed (pain).    Marland Kitchen albuterol (PROVENTIL) (2.5 MG/3ML) 0.083% nebulizer solution 90INHALE 1 VIAL VIA NEBULIZER THREE TIMES DAILY. (Patient taking differently: Take 2.5 mg by nebulization in the morning and at bedtime. *May use additional one time if needed for shortness of breath) 300 mL 3  . albuterol (VENTOLIN HFA) 108 (90 Base) MCG/ACT inhaler INHALE 2 PUFFS INTO THE LUNGS EVERY 6 HOURS AS NEEDED FOR WHEEZING OR SHORTNESS OF BREATH (Patient taking differently: Inhale 1-2 puffs into the lungs every 6 (six) hours as needed for wheezing or shortness of breath. ) 54 g 2  . allopurinol (ZYLOPRIM) 100 MG tablet TAKE 1 TABLET(100 MG) BY MOUTH DAILY (Patient taking differently: Take 100 mg by mouth in the morning. ) 90 tablet 1  . BREO ELLIPTA 100-25 MCG/INH AEPB 1 TAKE 1 INHALATION BY MOUTH ONCE DAILY (Patient taking differently: Inhale 1 puff into the lungs in the morning. ) 1 each 4  . cholecalciferol (VITAMIN D3) 25 MCG (1000 UT) tablet Take 1,000 Units by mouth in the morning.     . docusate sodium (COLACE) 100 MG capsule Take 300 mg by mouth at bedtime.     . fluticasone (FLONASE) 50 MCG/ACT nasal spray SHAKE LIQUID AND USE 2 SPRAYS IN EACH NOSTRIL DAILY (Patient taking differently: Place 2 sprays into both nostrils every morning. ) 48 g 1  . folic acid (FOLVITE) 1 MG tablet TAKE 1 TABLET(1 MG) BY MOUTH DAILY (Patient taking differently: Take 1 mg by mouth every evening. ) 100 tablet 5  . furosemide (LASIX) 40 MG tablet Take 1 tablet (40 mg total) by mouth daily. (Patient taking differently: Take 40 mg by mouth every morning. ) 90 tablet 1  . gabapentin (NEURONTIN) 300 MG capsule Take 1 capsule (300 mg total) by mouth 2 (two) times daily. 60 capsule 3  .  hydrALAZINE (APRESOLINE) 25 MG tablet Take 1 tablet (25 mg total) by mouth 2 (two) times daily. 180 tablet 3  . isosorbide mononitrate (IMDUR) 30 MG 24 hr tablet Take 0.5 tablets (15 mg total) by mouth every morning. 30 tablet 2  . Liniments (SALONPAS PAIN RELIEF PATCH EX) Apply 1 patch topically daily as needed (pain).    . meclizine (ANTIVERT) 12.5 MG tablet TAKE 1 TABLET(12.5 MG) BY MOUTH THREE TIMES DAILY AS NEEDED FOR  DIZZINESS 30 tablet 2  . metolazone (ZAROXOLYN) 2.5 MG tablet Take one tablet on Saturdays. (Patient taking differently: Take 2.5 mg by mouth every Saturday. ) 12 tablet 5  . metoprolol succinate (TOPROL-XL) 100 MG 24 hr tablet TAKE 1 TABLET BY MOUTH EVERY DAY WITH FOOD OR MILK (Patient taking differently: Take 100 mg by mouth every evening. ) 90 tablet 1  . Multiple Vitamin (MULTIVITAMIN) LIQD Take 5 mLs by mouth daily.    . nitroGLYCERIN (NITROSTAT) 0.4 MG SL tablet Place 1 tablet (0.4 mg total) under the tongue every 5 (five) minutes as needed for chest pain. 30 tablet 0  . ondansetron (ZOFRAN ODT) 4 MG disintegrating tablet Take 1 tablet (4 mg total) by mouth every 8 (eight) hours as needed for nausea or vomiting. 10 tablet 0  . Polyethyl [name] (SYSTANE) 0.4-0.3 % GEL ophthalmic gel Place 1 application into the left eye at bedtime.    . polyethylene glycol powder (GLYCOLAX/MIRALAX) 17 GM/SCOOP powder Take 17 g by mouth daily as needed for mild constipation or moderate constipation.    . potassium chloride SA (KLOR-CON) 20 MEQ tablet TAKE 1 TABLET(20 MEQ) BY MOUTH DAILY 90 tablet 1  . pravastatin (PRAVACHOL) 40 MG tablet TAKE 1 TABLET BY MOUTH EVERY EVENING (Patient taking differently: Take 40 mg by mouth every evening. ) 90 tablet 3  . SYNTHROID 75 MCG tablet TAKE 1 TABLET BY MOUTH DAILY (Patient taking differently: Take 75 mcg by mouth daily before breakfast. ) 90 tablet 1  . warfarin (JANTOVEN) 5 MG tablet TAKE 2 TABLETS EVERY DAY EXCEPT 1 TABLET ON SATURDAY  (Patient taking differently: Take 5-10 mg by mouth See admin instructions. TAKE 2 TABLETS EVERY DAY EXCEPT 1 TABLET ON Saturday @ 8:30PM) 60 tablet 0   No current facility-administered medications for this visit.   Facility-Administered Medications Ordered in Other Visits  Medication Dose Route Frequency Provider Last Rate Last Admin  . epoetin alfa (EPOGEN,PROCRIT) injection 40,000 Units  40,000 Units Subcutaneous Once Farrel Gobble, MD       _____________   Allergies:   Penicillins, Aspirin, Fentanyl, and Niacin  _____________   Social History:  The patient  reports that she quit smoking about 33 years ago. Her smoking use included cigarettes. She has a 30.00 pack-year smoking history. She has never used smokeless tobacco. She reports that she does not drink alcohol and does not use drugs.  _____________   Family History:  The patient's family history includes Diabetes in her brother and brother; Heart attack in her sister; Heart disease in her brother, brother, father, and sister; Hypertension in her brother; Lung cancer in her brother; Pancreatic cancer in her mother.  _____________   ROS:  Please see the history of present illness.   Positive for LLE and sob,   All other systems are reviewed and negative.  _____________   PHYSICAL EXAM: VS:  Ht 5\' 7"  (1.702 m)   BMI 30.85 kg/m  , BMI Body mass index is 30.85 kg/m. GEN: Well nourished, well developed, in no acute distress  HEENT: normal  Neck: no JVD, carotid bruits, or masses Cardiac: RRR; + murmur, no rubs, or gallops. No clubbing, cyanosis, 2+ B/L edema.  Radials/DP/PT 2+ and equal bilaterally.  Respiratory:  Diminished at bases GI: soft, nontender, nondistended, + BS MS: no deformity or atrophy  Skin: warm and dry, no rash Neuro:  Strength and sensation are intact Psych: euthymic mood, full affect _____________  EKG:   The ekg ordered today  shows V-paced rhythm 61 bpm  Recent Labs: 01/30/2020: B Natriuretic  Peptide 778.2 02/03/2020: Magnesium 2.1 02/16/2020: TSH 2.32 03/10/2020: ALT 20 03/14/2020: BUN 41; Creatinine, Ser 1.37; Potassium 5.0; Sodium 135 03/18/2020: Hemoglobin 9.7; Platelets 125  07/07/2019: VLDL 12 01/05/2020: Cholesterol 124; HDL 44; LDL Cholesterol (Calc) 65; Total CHOL/HDL Ratio 2.8; Triglycerides 73  Estimated Creatinine Clearance: 33.8 mL/min (A) (by C-G formula based on SCr of 1.37 mg/dL (H)).  Wt Readings from Last 3 Encounters:  03/23/20 197 lb (89.4 kg)  03/14/20 182 lb 15.7 oz (83 kg)  02/26/20 183 lb (83 kg)    _____________   ASSESSMENT AND PLAN:  Persistent afib - metoprolol and coumadin - rate controlled  S/p Biv PPM - reprogramming on the last visist 5/28 - Most recent scheduled remote transmission 6/21 patient was feeling well.  Chronic combined systolic and diastolic CHF - Recent hospitalization for CHF and afib. Echo showed decreased EF to 30-35%. Concern whether rising LV threshold attributing to decreased EF - Hydralazine 25 mg BID - Imdur 30 mg daily - Toprol XL 100 mg daily - Lasix 40 mg daily with Metolazone 2.5 mg on Saturdays - Patient has at least 8lbs weight gain since the last visit, which was a month ago. On exam she has 2+ pitting edema. No crackles on exam. - Instructed the patient to take an extra metolazone today along with lasix. Take her normal lasix/metolazone regimen Saturday as well. Take an extra potassium on days she is taking metolazone. Plan for close follow-up in 1-2 weeks, there will check labs and fluid status. Recommended she go the ER for worsening symptoms. Recent labs show Scr 1.3 and potassium 5.   HTN - pressure up today  - will reassess at the next visit given LLE/sob  HLD - pravastatin - LDL 65, HDL 44, Total chol 124, TG 73 12/2019  H/o of AVR/MVR with mechanical valve - on coumadin - stable on last echo   Disposition:   FU with in 1-2 weeks  Signed, Ranson Belluomini Ninfa Meeker, NP 03/25/2020 11:37 AM     _____________ [facility] [address] [place] [address]  [phone] (office) [phone] (fax)

## 2020-03-28 ENCOUNTER — Encounter: Payer: Self-pay | Admitting: Family Medicine

## 2020-03-28 DIAGNOSIS — R040 Epistaxis: Secondary | ICD-10-CM | POA: Insufficient documentation

## 2020-03-28 NOTE — Assessment & Plan Note (Signed)
Controlled, no change in medication  

## 2020-03-28 NOTE — Assessment & Plan Note (Signed)
Needs additional in home help , as she cannot carry out her ADL's , will apply for increased number of days and hours

## 2020-03-28 NOTE — Assessment & Plan Note (Signed)
Needs ENT evaluation will refer.

## 2020-03-28 NOTE — Assessment & Plan Note (Signed)
Unchanged, maintains good blood sugar off medication

## 2020-03-28 NOTE — Assessment & Plan Note (Signed)
ED visit reviewed with the patient and all questions answered.  She has been referred to ENT for further evaluation of unilateral epistaxis.

## 2020-03-28 NOTE — Progress Notes (Addendum)
[name] [name]     MRN: [id number]      DOB: [birth year]   HPI [name] is here for follow up of recent ED visit on March 14, 2020 when she presented with epistaxis.  She states that the bleeding is not concerning for septic ammonia actually stopped with direct pressure And There Was Not Much Bleeding by the Time She Got to the Emergency Room However She Is Maintained on Chronic Anticoagulation and thought it  to be more prudent to go to get evaluated. Bleeding was only from right nostril  Also here for f/u of chronic  medical conditions, medication management and review of any available recent lab and radiology data.  The PT denies any adverse reactions to current medications since the last visit.  Recently finally got in home help 5 days/week , 4 hr/ day, needs a 7 day/ week schedule and longer hrs each day. She has heart failure, valvular heart disease and is s/p mitral valve replacement , has bot a pacemaker an da defibrillator and severe osteoarthritis of all ;arge joints and spine which make her unable to perform ADL's independently. Request will be placed for longer hrs and 7 day/ week care She also requests a wheelchair for safe ambulation and needs one due to severe arthritis of spine , and hips, with congestive heart failure, both limiting her ability to ambulate safely ROS Denies recent fever or chills. Denies sinus pressure, nasal congestion, ear pain or sore throat. Denies chest congestion, productive cough or wheezing. Denies chest pains, palpitations and leg swelling Denies abdominal pain, nausea, vomiting,diarrhea or constipation.   Denies dysuria, frequency, hesitancy does have  incontinence. C/o  joint pain, swelling and limitation in mobility. Denies headaches, seizures, numbness, or tingling. Denies depression, anxiety or insomnia. Denies skin break down or rash.   PE  BP (!) 152/88   Pulse 65   Resp 17   Ht 5\' 7"  (1.702 m)   Wt 197 lb (89.4 kg)   SpO2 92%  Comment: on 2 liters oxygen  BMI 30.85 kg/m   Patient alert and oriented and in no cardiopulmonary distress.  HEENT: No facial asymmetry, EOMI,     Neck decreased ROM.  Chest: Reduced air entry, scattered crackles no wheezes CVS: S1, S2 no murmurs, no S3.Regular rate.  ABD: Soft non tender.   Ext: No edema  MS: Markedly reudeced ROM spine, shoulders, hips and knees.  Skin: Intact, no ulcerations or rash noted.  Psych: Good eye contact, normal affect. Memory intact not anxious or depressed appearing.  CNS: CN 2-12 intact, power,  normal throughout.no focal deficits noted.   Assessment & Plan  Encounter for examination following treatment at hospital ED visit reviewed with the patient and all questions answered.  She has been referred to ENT for further evaluation of unilateral epistaxis.  Right-sided epistaxis Needs ENT evaluation will refer.  Essential hypertension Controlled, no change in medication   Prediabetes Unchanged, maintains good blood sugar off medication  Physical debility Needs additional in home help , as she cannot carry out her ADL's , will apply for increased number of days and hours  Trochanteric bursitis, right hip Chronic , limits safe mobility, managed by Ortho, and gets intrarticular injections sporadic. High fall risk Will benefit from and m needs wheelchair for safe mobility  OA (osteoarthritis) of knee Increased fall risk and limitation in safe mobilty. Will benefit from and needs wheelchair for safe mobility  Spondylosis Multilevel severe arthritis in lumbar spine with disc  disease. Lower extremity weakness, with increased fall risk. Needs wheelchair for safe mobility

## 2020-03-30 ENCOUNTER — Other Ambulatory Visit: Payer: Self-pay

## 2020-03-30 ENCOUNTER — Emergency Department (HOSPITAL_COMMUNITY): Payer: Medicare Other

## 2020-03-30 ENCOUNTER — Inpatient Hospital Stay (HOSPITAL_COMMUNITY)
Admission: EM | Admit: 2020-03-30 | Discharge: 2020-04-02 | DRG: 291 | Disposition: A | Discharge status: Home Health | Payer: Medicare Other | Attending: Family Medicine | Admitting: Family Medicine

## 2020-03-30 ENCOUNTER — Encounter (HOSPITAL_COMMUNITY): Payer: Self-pay | Admitting: Emergency Medicine

## 2020-03-30 DIAGNOSIS — Z885 Allergy status to narcotic agent status: Secondary | ICD-10-CM

## 2020-03-30 DIAGNOSIS — Z880 Allergy status to penicillin: Secondary | ICD-10-CM

## 2020-03-30 DIAGNOSIS — I130 Hypertensive heart and chronic kidney disease with heart failure and stage 1 through stage 4 chronic kidney disease, or unspecified chronic kidney disease: Principal | ICD-10-CM | POA: Diagnosis present

## 2020-03-30 DIAGNOSIS — Z66 Do not resuscitate: Secondary | ICD-10-CM | POA: Diagnosis present

## 2020-03-30 DIAGNOSIS — Z833 Family history of diabetes mellitus: Secondary | ICD-10-CM

## 2020-03-30 DIAGNOSIS — Z79899 Other long term (current) drug therapy: Secondary | ICD-10-CM

## 2020-03-30 DIAGNOSIS — I4892 Unspecified atrial flutter: Secondary | ICD-10-CM | POA: Diagnosis present

## 2020-03-30 DIAGNOSIS — I5021 Acute systolic (congestive) heart failure: Secondary | ICD-10-CM | POA: Diagnosis present

## 2020-03-30 DIAGNOSIS — Z8249 Family history of ischemic heart disease and other diseases of the circulatory system: Secondary | ICD-10-CM

## 2020-03-30 DIAGNOSIS — Z87891 Personal history of nicotine dependence: Secondary | ICD-10-CM

## 2020-03-30 DIAGNOSIS — D631 Anemia in chronic kidney disease: Secondary | ICD-10-CM | POA: Diagnosis present

## 2020-03-30 DIAGNOSIS — J441 Chronic obstructive pulmonary disease with (acute) exacerbation: Secondary | ICD-10-CM

## 2020-03-30 DIAGNOSIS — E7849 Other hyperlipidemia: Secondary | ICD-10-CM | POA: Diagnosis present

## 2020-03-30 DIAGNOSIS — E871 Hypo-osmolality and hyponatremia: Secondary | ICD-10-CM | POA: Diagnosis not present

## 2020-03-30 DIAGNOSIS — I509 Heart failure, unspecified: Secondary | ICD-10-CM | POA: Diagnosis not present

## 2020-03-30 DIAGNOSIS — M109 Gout, unspecified: Secondary | ICD-10-CM | POA: Diagnosis present

## 2020-03-30 DIAGNOSIS — I4819 Other persistent atrial fibrillation: Secondary | ICD-10-CM | POA: Diagnosis present

## 2020-03-30 DIAGNOSIS — J9611 Chronic respiratory failure with hypoxia: Secondary | ICD-10-CM | POA: Diagnosis not present

## 2020-03-30 DIAGNOSIS — Z888 Allergy status to other drugs, medicaments and biological substances status: Secondary | ICD-10-CM

## 2020-03-30 DIAGNOSIS — I493 Ventricular premature depolarization: Secondary | ICD-10-CM | POA: Diagnosis present

## 2020-03-30 DIAGNOSIS — Z952 Presence of prosthetic heart valve: Secondary | ICD-10-CM

## 2020-03-30 DIAGNOSIS — R600 Localized edema: Secondary | ICD-10-CM

## 2020-03-30 DIAGNOSIS — J449 Chronic obstructive pulmonary disease, unspecified: Secondary | ICD-10-CM | POA: Diagnosis present

## 2020-03-30 DIAGNOSIS — Z800 Family history of malignant neoplasm of digestive organs: Secondary | ICD-10-CM

## 2020-03-30 DIAGNOSIS — E1122 Type 2 diabetes mellitus with diabetic chronic kidney disease: Secondary | ICD-10-CM | POA: Diagnosis present

## 2020-03-30 DIAGNOSIS — Z20822 Contact with and (suspected) exposure to covid-19: Secondary | ICD-10-CM | POA: Diagnosis not present

## 2020-03-30 DIAGNOSIS — I5043 Acute on chronic combined systolic (congestive) and diastolic (congestive) heart failure: Secondary | ICD-10-CM | POA: Diagnosis present

## 2020-03-30 DIAGNOSIS — R0602 Shortness of breath: Secondary | ICD-10-CM | POA: Diagnosis not present

## 2020-03-30 DIAGNOSIS — Z886 Allergy status to analgesic agent status: Secondary | ICD-10-CM

## 2020-03-30 DIAGNOSIS — Z86010 Personal history of colonic polyps: Secondary | ICD-10-CM

## 2020-03-30 DIAGNOSIS — I428 Other cardiomyopathies: Secondary | ICD-10-CM | POA: Diagnosis present

## 2020-03-30 DIAGNOSIS — Z7951 Long term (current) use of inhaled steroids: Secondary | ICD-10-CM

## 2020-03-30 DIAGNOSIS — I10 Essential (primary) hypertension: Secondary | ICD-10-CM | POA: Diagnosis present

## 2020-03-30 DIAGNOSIS — J90 Pleural effusion, not elsewhere classified: Secondary | ICD-10-CM | POA: Diagnosis not present

## 2020-03-30 DIAGNOSIS — Z95810 Presence of automatic (implantable) cardiac defibrillator: Secondary | ICD-10-CM | POA: Diagnosis present

## 2020-03-30 DIAGNOSIS — N184 Chronic kidney disease, stage 4 (severe): Secondary | ICD-10-CM | POA: Diagnosis present

## 2020-03-30 DIAGNOSIS — I5022 Chronic systolic (congestive) heart failure: Secondary | ICD-10-CM | POA: Diagnosis present

## 2020-03-30 DIAGNOSIS — Z79890 Hormone replacement therapy (postmenopausal): Secondary | ICD-10-CM

## 2020-03-30 DIAGNOSIS — E890 Postprocedural hypothyroidism: Secondary | ICD-10-CM | POA: Diagnosis present

## 2020-03-30 DIAGNOSIS — I442 Atrioventricular block, complete: Secondary | ICD-10-CM | POA: Diagnosis present

## 2020-03-30 DIAGNOSIS — Z801 Family history of malignant neoplasm of trachea, bronchus and lung: Secondary | ICD-10-CM

## 2020-03-30 DIAGNOSIS — Z7901 Long term (current) use of anticoagulants: Secondary | ICD-10-CM

## 2020-03-30 DIAGNOSIS — I110 Hypertensive heart disease with heart failure: Secondary | ICD-10-CM | POA: Diagnosis not present

## 2020-03-30 DIAGNOSIS — I517 Cardiomegaly: Secondary | ICD-10-CM | POA: Diagnosis not present

## 2020-03-30 LAB — CBC
HCT: 32.8 % — ABNORMAL LOW (ref 36.0–46.0)
Hemoglobin: 10.1 g/dL — ABNORMAL LOW (ref 12.0–15.0)
MCH: 29.4 pg (ref 26.0–34.0)
MCHC: 30.8 g/dL (ref 30.0–36.0)
MCV: 95.3 fL (ref 80.0–100.0)
Platelets: 208 10*3/uL (ref 150–400)
RBC: 3.44 MIL/uL — ABNORMAL LOW (ref 3.87–5.11)
RDW: 17.2 % — ABNORMAL HIGH (ref 11.5–15.5)
WBC: 6.3 10*3/uL (ref 4.0–10.5)
nRBC: 0 % (ref 0.0–0.2)

## 2020-03-30 LAB — BASIC METABOLIC PANEL
Anion gap: 9 (ref 5–15)
BUN: 44 mg/dL — ABNORMAL HIGH (ref 8–23)
CO2: 31 mmol/L (ref 22–32)
Calcium: 9.4 mg/dL (ref 8.9–10.3)
Chloride: 90 mmol/L — ABNORMAL LOW (ref 98–111)
Creatinine, Ser: 1.5 mg/dL — ABNORMAL HIGH (ref 0.44–1.00)
GFR calc Af Amer: 36 mL/min — ABNORMAL LOW (ref >60)
GFR calc non Af Amer: 31 mL/min — ABNORMAL LOW (ref >60)
Glucose, Bld: 97 mg/dL (ref 70–99)
Potassium: 5.4 mmol/L — ABNORMAL HIGH (ref 3.5–5.1)
Sodium: 130 mmol/L — ABNORMAL LOW (ref 135–145)

## 2020-03-30 LAB — HEPATIC FUNCTION PANEL
ALT: 20 U/L (ref 0–44)
AST: 28 U/L (ref 15–41)
Albumin: 3.8 g/dL (ref 3.5–5.0)
Alkaline Phosphatase: 51 U/L (ref 38–126)
Bilirubin, Direct: <0.1 mg/dL (ref 0.0–0.2)
Total Bilirubin: 0.4 mg/dL (ref 0.3–1.2)
Total Protein: 7.5 g/dL (ref 6.5–8.1)

## 2020-03-30 LAB — SARS CORONAVIRUS 2 BY RT PCR (HOSPITAL ORDER, PERFORMED IN ~~LOC~~ HOSPITAL LAB): SARS Coronavirus 2: NEGATIVE

## 2020-03-30 LAB — GLUCOSE, CAPILLARY: Glucose-Capillary: 95 mg/dL (ref 70–99)

## 2020-03-30 LAB — BRAIN NATRIURETIC PEPTIDE: B Natriuretic Peptide: 502 pg/mL — ABNORMAL HIGH (ref 0.0–100.0)

## 2020-03-30 MED ORDER — POLYETHYLENE GLYCOL 3350 17 G PO PACK
17.0000 g | PACK | Freq: Every day | ORAL | Status: DC | PRN
  Administered 2020-03-31: 17 g via ORAL
  Filled 2020-03-30: qty 1

## 2020-03-30 MED ORDER — ONDANSETRON HCL 4 MG PO TABS
4.0000 mg | ORAL_TABLET | Freq: Four times a day (QID) | ORAL | Status: DC | PRN
  Administered 2020-04-01: 4 mg via ORAL
  Filled 2020-03-30: qty 1

## 2020-03-30 MED ORDER — ISOSORBIDE MONONITRATE ER 30 MG PO TB24
15.0000 mg | ORAL_TABLET | Freq: Every morning | ORAL | Status: DC
  Administered 2020-03-31 – 2020-04-02 (×3): 15 mg via ORAL
  Filled 2020-03-30 (×3): qty 1

## 2020-03-30 MED ORDER — HYDRALAZINE HCL 25 MG PO TABS
25.0000 mg | ORAL_TABLET | Freq: Two times a day (BID) | ORAL | Status: DC
  Administered 2020-03-30 – 2020-04-02 (×6): 25 mg via ORAL
  Filled 2020-03-30 (×6): qty 1

## 2020-03-30 MED ORDER — ALBUTEROL SULFATE (2.5 MG/3ML) 0.083% IN NEBU: 2.5000 mg | INHALATION_SOLUTION | RESPIRATORY_TRACT | Status: DC | PRN

## 2020-03-30 MED ORDER — ACETAMINOPHEN 650 MG RE SUPP: 650.0000 mg | Freq: Four times a day (QID) | RECTAL | Status: DC | PRN

## 2020-03-30 MED ORDER — ACETAMINOPHEN 325 MG PO TABS
650.0000 mg | ORAL_TABLET | Freq: Four times a day (QID) | ORAL | Status: DC | PRN
  Administered 2020-03-30 – 2020-03-31 (×2): 650 mg via ORAL
  Filled 2020-03-30 (×2): qty 2

## 2020-03-30 MED ORDER — FLUTICASONE FUROATE-VILANTEROL 100-25 MCG/INH IN AEPB
1.0000 | INHALATION_SPRAY | Freq: Every day | RESPIRATORY_TRACT | Status: DC
  Administered 2020-03-31 – 2020-04-02 (×3): 1 via RESPIRATORY_TRACT
  Filled 2020-03-30: qty 28

## 2020-03-30 MED ORDER — FLUTICASONE PROPIONATE 50 MCG/ACT NA SUSP
2.0000 | Freq: Every morning | NASAL | Status: DC
  Administered 2020-03-31 – 2020-04-02 (×2): 2 via NASAL
  Filled 2020-03-30: qty 16

## 2020-03-30 MED ORDER — ALLOPURINOL 100 MG PO TABS
100.0000 mg | ORAL_TABLET | Freq: Every day | ORAL | Status: DC
  Administered 2020-03-31 – 2020-04-02 (×3): 100 mg via ORAL
  Filled 2020-03-30 (×3): qty 1

## 2020-03-30 MED ORDER — LEVOTHYROXINE SODIUM 75 MCG PO TABS
75.0000 ug | ORAL_TABLET | Freq: Every day | ORAL | Status: DC
  Administered 2020-03-31 – 2020-04-02 (×3): 75 ug via ORAL
  Filled 2020-03-30 (×3): qty 1

## 2020-03-30 MED ORDER — FUROSEMIDE 10 MG/ML IJ SOLN
40.0000 mg | Freq: Once | INTRAMUSCULAR | Status: AC
  Administered 2020-03-30: 40 mg via INTRAVENOUS
  Filled 2020-03-30: qty 4

## 2020-03-30 MED ORDER — FUROSEMIDE 10 MG/ML IJ SOLN: 40.0000 mg | Freq: Once | INTRAMUSCULAR | Status: DC

## 2020-03-30 MED ORDER — HYDRALAZINE HCL 20 MG/ML IJ SOLN: 10.0000 mg | INTRAMUSCULAR | Status: DC | PRN

## 2020-03-30 MED ORDER — WARFARIN - PHARMACIST DOSING INPATIENT: Freq: Every day | Status: DC

## 2020-03-30 MED ORDER — METOPROLOL SUCCINATE ER 50 MG PO TB24
100.0000 mg | ORAL_TABLET | Freq: Every evening | ORAL | Status: DC
  Administered 2020-03-31 – 2020-04-01 (×3): 100 mg via ORAL
  Filled 2020-03-30 (×3): qty 2

## 2020-03-30 MED ORDER — FUROSEMIDE 10 MG/ML IJ SOLN
40.0000 mg | Freq: Two times a day (BID) | INTRAMUSCULAR | Status: DC
  Administered 2020-03-31 – 2020-04-02 (×5): 40 mg via INTRAVENOUS
  Filled 2020-03-30 (×5): qty 4

## 2020-03-30 MED ORDER — GABAPENTIN 300 MG PO CAPS
300.0000 mg | ORAL_CAPSULE | Freq: Two times a day (BID) | ORAL | Status: DC
  Administered 2020-03-30 – 2020-04-02 (×6): 300 mg via ORAL
  Filled 2020-03-30 (×6): qty 1

## 2020-03-30 MED ORDER — ONDANSETRON HCL 4 MG/2ML IJ SOLN
4.0000 mg | Freq: Four times a day (QID) | INTRAMUSCULAR | Status: DC | PRN
  Administered 2020-03-31: 4 mg via INTRAVENOUS
  Filled 2020-03-30: qty 2

## 2020-03-30 NOTE — Progress Notes (Deleted)
Cardiology Office Note   Date:  03/30/2020   ID:  [name] [name], DOB [birth year], MRN [id number]  PCP:  Fayrene Helper, MD  Cardiologist:  Dr. Sallyanne Kuster  No chief complaint on file.    History of Present Illness: [name] [name] is a 86 y.o. female who presents we are following for ongoing assessment and management of persistent atrial fibrillation, history of complete heart block status post [employer] BiV pacemaker placed by Dr. Lovena Le on 01/30/2019, hyperlipidemia, chronic combined systolic and diastolic heart failure, status post aortic and mitral valve replacement with mechanical valve in 2001 for aortic valve, and 2004 for mitral valve. She has other history to include cardiomyopathy with an EF of 25% initially but improved on follow-up echo on 01/31/2019 to 30% to 35%.   Noncardiac history includes COPD on home O2, diabetes type 2, hypothyroidism.  She was hospitalized in May 2021 for weakness and pain and was found to have decompensated CHF.  LV threshold was elevated per pacemaker interrogation and adjustments were made to her device.  She had a second admission in May 2021 with decompensated heart failure and atrial fibrillation.  Repeat echo revealed EF of 35% with global hypokinesis and elevated PASP of 71.2 mmHg.  She underwent IV diuresis and discharged on Lasix 40 mg daily and metolazone once a week.  She was seen in our office on 03/25/2020 by Cadence Kathlen Mody, PA.  At that time she reported worsening lower leg edema for the last couple of days with some mild shortness of breath was worsening on exertion.  She stated that she felt a little short of breath with talking.  O2 saturation on O2 2 L was 92.  At that time her weight was 197 pounds, with her weight normally in the 180s.  She admitted to going out to eat more often.  On that visit, the patient was noted to have an 8 pound weight gain and 2+ pitting edema.  She was instructed to take an extra metolazone that day with her Lasix, and  go back to her normal Lasix/metolazone regimen to include taking her metolazone on Saturdays as usual.  She was to take extra potassium on the days that she was taking metolazone.  Ever symptoms persisted or worsened she was to report to ED.  Past Medical History:  Diagnosis Date  . Acute on chronic diastolic CHF (congestive heart failure) ([place]) 05/19/2012  . Acute on chronic respiratory failure with hypoxia ([place]) 01/31/2020  . Adenomatous polyp of colon 12/16/2002   Dr. [name]/[facility]  . Allergy   . Calculus of gallbladder with chronic cholecystitis without obstruction 02/09/2009   Qualifier: Diagnosis of  By: [name], [name]    . Cataract   . CHB (complete heart block) ([place]) 10/07/2014      . CHF (congestive heart failure) ([place])    a.  EF previously reduced to 25-30% b. EF improved to 60-65% by echo in 10/2017.   Marland Kitchen Chronic kidney disease, stage I 2006  . Constipation       . COPD (chronic obstructive pulmonary disease) ([place])       . DJD (degenerative joint disease) of lumbar spine   . DM (diabetes mellitus) ([place]) 2006  . Esophageal reflux   . Glaucoma   . Gout       . H/O adenomatous polyp of colon 05/24/2012   2004 in [facility] colonoscopy 2008 normal    . H/O heart valve replacement with mechanical valve  a. s/p mechanical AVR in 2001 and mechanical MVR in 2004.  Marland Kitchen H/O wisdom tooth extraction   . HIP PAIN, RIGHT 04/23/2009   Qualifier: Diagnosis of  By: [name], [name]    . Hyperlipemia   . IDA (iron deficiency anemia)    Parenteral iron/[name]  . Insomnia       . Nausea without vomiting 06/07/2010   Qualifier: Diagnosis of  By: [name], [name]    . Non-ischemic cardiomyopathy ([place])    a. s/p MDT CRTD  . Obesity, unspecified   . Other and unspecified hyperlipidemia   . Oxygen deficiency 2014   nocturnal;  . Papilloma of breast 03/06/2012   This was diagnosed as a left breast papilloma by ultrasound  characteristics and symptoms in 2010. Because of possible medical risk factors no surgical intervention has been done and it did doing annual followups. The area has been stable by physical examination and mammograms and ultrasound since 2010.   Marland Kitchen Spinal stenosis of lumbar region 02/19/2013  . Spondylolisthesis   . Spondylosis   . Thyroid cancer ([place])    remote thyroidectomy, no recurrence, pt denies in 2016 thyroid cancer  . Type 2 diabetes mellitus with hyperglycemia ([place]) 07/15/2010  . Unspecified hypothyroidism       . 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 Vertigo         Past Surgical History:  Procedure Laterality Date  . AORTIC AND MITRAL VALVE REPLACEMENT  2001  . BI-VENTRICULAR IMPLANTABLE CARDIOVERTER DEFIBRILLATOR UPGRADE N/A 01/18/2015   Procedure: BI-VENTRICULAR IMPLANTABLE CARDIOVERTER DEFIBRILLATOR UPGRADE;  Surgeon: Evans Lance, MD;  Location: [facility] CATH LAB;  Service: Cardiovascular;  Laterality: N/A;  . BIV ICD GENERATOR CHANGEOUT N/A 01/30/2019   Procedure: BIV ICD GENERATOR CHANGEOUT;  Surgeon: Evans Lance, MD;  Location: [place] CV LAB;  Service: Cardiovascular;  Laterality: N/A;  . CARDIAC CATHETERIZATION    . CARDIAC VALVE REPLACEMENT    . CARDIOVERSION N/A 11/13/2016   Procedure: CARDIOVERSION;  Surgeon: Sanda Klein, MD;  Location: [facility] ENDOSCOPY;  Service: Cardiovascular;  Laterality: N/A;  . COLONOSCOPY  06/12/2007   [place]k- Normal rectum/Normal colon  . COLONOSCOPY  12/16/02   small hemorrhoids  . COLONOSCOPY  06/20/2012   Procedure: COLONOSCOPY;  Surgeon: Daneil Dolin, MD;  Location: [facility] ENDO SUITE;  Service: Endoscopy;  Laterality: N/A;  10:45  . defibrillator implanted 2006    . DOPPLER ECHOCARDIOGRAPHY  2012  . DOPPLER ECHOCARDIOGRAPHY  05,06,07,08,09,2011  . ESOPHAGOGASTRODUODENOSCOPY  06/12/2007   Rourk- normal esophagus, small hiatal hernia, otherwise normal stomach, D1, D2  . EYE SURGERY Right 2005  . EYE SURGERY  Left 2006  . ICD LEAD REMOVAL Left 02/08/2015   Procedure: ICD LEAD REMOVAL;  Surgeon: Evans Lance, MD;  Location: [facility] OR;  Service: Cardiovascular;  Laterality: Left;  "Will plan extraction and insertion of a BiV PM"  **Dr. Roxan Hockey backing up case**  . IMPLANTABLE CARDIOVERTER DEFIBRILLATOR (ICD) GENERATOR CHANGE Left 02/08/2015   Procedure: ICD GENERATOR CHANGE;  Surgeon: Evans Lance, MD;  Location: [place];  Service: Cardiovascular;  Laterality: Left;  . INSERT / REPLACE / REMOVE PACEMAKER    . PACEMAKER INSERTION  May 2016  . pacemaker placed  2004  . right breast cyst removed     benign   . right cataract removed     2005  . TEE WITHOUT CARDIOVERSION N/A 11/13/2016   Procedure: TRANSESOPHAGEAL ECHOCARDIOGRAM (TEE);  Surgeon: Sanda Klein, MD;  Location: [facility] ENDOSCOPY;  Service: Cardiovascular;  Laterality: N/A;  . THYROIDECTOMY       Current Outpatient Medications  Medication Sig Dispense Refill  . acetaminophen (TYLENOL) 500 MG tablet Take 500 mg by mouth every 6 (six) hours as needed (pain).    Marland Kitchen albuterol (PROVENTIL) (2.5 MG/3ML) 0.083% nebulizer solution 90INHALE 1 VIAL VIA NEBULIZER THREE TIMES DAILY. (Patient taking differently: Take 2.5 mg by nebulization in the morning and at bedtime. *May use additional one time if needed for shortness of breath) 300 mL 3  . albuterol (VENTOLIN HFA) 108 (90 Base) MCG/ACT inhaler INHALE 2 PUFFS INTO THE LUNGS EVERY 6 HOURS AS NEEDED FOR WHEEZING OR SHORTNESS OF BREATH (Patient taking differently: Inhale 1-2 puffs into the lungs every 6 (six) hours as needed for wheezing or shortness of breath. ) 54 g 2  . allopurinol (ZYLOPRIM) 100 MG tablet TAKE 1 TABLET(100 MG) BY MOUTH DAILY (Patient taking differently: Take 100 mg by mouth in the morning. ) 90 tablet 1  . BREO ELLIPTA 100-25 MCG/INH AEPB 1 TAKE 1 INHALATION BY MOUTH ONCE DAILY (Patient taking differently: Inhale 1 puff into the lungs in the morning. ) 1 each 4  . cholecalciferol  (VITAMIN D3) 25 MCG (1000 UT) tablet Take 1,000 Units by mouth in the morning.     . docusate sodium (COLACE) 100 MG capsule Take 300 mg by mouth at bedtime.     . fluticasone (FLONASE) 50 MCG/ACT nasal spray SHAKE LIQUID AND USE 2 SPRAYS IN EACH NOSTRIL DAILY (Patient taking differently: Place 2 sprays into both nostrils every morning. ) 48 g 1  . folic acid (FOLVITE) 1 MG tablet TAKE 1 TABLET(1 MG) BY MOUTH DAILY (Patient taking differently: Take 1 mg by mouth every evening. ) 100 tablet 5  . furosemide (LASIX) 40 MG tablet Take 1 tablet (40 mg total) by mouth daily. (Patient taking differently: Take 40 mg by mouth every morning. ) 90 tablet 1  . gabapentin (NEURONTIN) 300 MG capsule Take 1 capsule (300 mg total) by mouth 2 (two) times daily. 60 capsule 3  . hydrALAZINE (APRESOLINE) 25 MG tablet Take 1 tablet (25 mg total) by mouth 2 (two) times daily. 180 tablet 3  . isosorbide mononitrate (IMDUR) 30 MG 24 hr tablet Take 0.5 tablets (15 mg total) by mouth every morning. 30 tablet 2  . Liniments (SALONPAS PAIN RELIEF PATCH EX) Apply 1 patch topically daily as needed (pain).    . meclizine (ANTIVERT) 12.5 MG tablet TAKE 1 TABLET(12.5 MG) BY MOUTH THREE TIMES DAILY AS NEEDED FOR DIZZINESS 30 tablet 2  . metolazone (ZAROXOLYN) 2.5 MG tablet Take one tablet on Saturdays. (Patient taking differently: Take 2.5 mg by mouth every Saturday. ) 12 tablet 5  . metoprolol succinate (TOPROL-XL) 100 MG 24 hr tablet TAKE 1 TABLET BY MOUTH EVERY DAY WITH FOOD OR MILK (Patient taking differently: Take 100 mg by mouth every evening. ) 90 tablet 1  . Multiple Vitamin (MULTIVITAMIN) LIQD Take 5 mLs by mouth daily.    . nitroGLYCERIN (NITROSTAT) 0.4 MG SL tablet Place 1 tablet (0.4 mg total) under the tongue every 5 (five) minutes as needed for chest pain. 30 tablet 0  . ondansetron (ZOFRAN ODT) 4 MG disintegrating tablet Take 1 tablet (4 mg total) by mouth every 8 (eight) hours as needed for nausea or vomiting. 10 tablet  0  . Polyethyl [name] (SYSTANE) 0.4-0.3 % GEL ophthalmic gel Place 1 application into the left eye at bedtime.    Marland Kitchen  polyethylene glycol powder (GLYCOLAX/MIRALAX) 17 GM/SCOOP powder Take 17 g by mouth daily as needed for mild constipation or moderate constipation.    . potassium chloride SA (KLOR-CON) 20 MEQ tablet TAKE 1 TABLET(20 MEQ) BY MOUTH DAILY 90 tablet 1  . pravastatin (PRAVACHOL) 40 MG tablet TAKE 1 TABLET BY MOUTH EVERY EVENING (Patient taking differently: Take 40 mg by mouth every evening. ) 90 tablet 3  . SYNTHROID 75 MCG tablet TAKE 1 TABLET BY MOUTH DAILY (Patient taking differently: Take 75 mcg by mouth daily before breakfast. ) 90 tablet 1  . warfarin (JANTOVEN) 5 MG tablet TAKE 2 TABLETS EVERY DAY EXCEPT 1 TABLET ON SATURDAY (Patient taking differently: Take 5-10 mg by mouth See admin instructions. TAKE 2 TABLETS EVERY DAY EXCEPT 1 TABLET ON Saturday @ 8:30PM) 60 tablet 0   No current facility-administered medications for this visit.   Facility-Administered Medications Ordered in Other Visits  Medication Dose Route Frequency Provider Last Rate Last Admin  . epoetin alfa (EPOGEN,PROCRIT) injection 40,000 Units  40,000 Units Subcutaneous Once Farrel Gobble, MD        Allergies:   Penicillins, Aspirin, Fentanyl, and Niacin    Social History:  The patient  reports that she quit smoking about 33 years ago. Her smoking use included cigarettes. She has a 30.00 pack-year smoking history. She has never used smokeless tobacco. She reports that she does not drink alcohol and does not use drugs.   Family History:  The patient's family history includes Diabetes in her brother and brother; Heart attack in her sister; Heart disease in her brother, brother, father, and sister; Hypertension in her brother; Lung cancer in her brother; Pancreatic cancer in her mother.    ROS: All other systems are reviewed and negative. Unless otherwise mentioned in H&P    PHYSICAL  EXAM: VS:  There were no vitals taken for this visit. , BMI There is no height or weight on file to calculate BMI. GEN: Well nourished, well developed, in no acute distress HEENT: normal Neck: no JVD, carotid bruits, or masses Cardiac: ***RRR; no murmurs, rubs, or gallops,no edema  Respiratory:  Clear to auscultation bilaterally, normal work of breathing GI: soft, nontender, nondistended, + BS MS: no deformity or atrophy Skin: warm and dry, no rash Neuro:  Strength and sensation are intact Psych: euthymic mood, full affect   EKG:  EKG {ACTION; IS/IS [id number]} ordered today. The ekg ordered today demonstrates ***   Recent Labs: 01/30/2020: B Natriuretic Peptide 778.2 02/03/2020: Magnesium 2.1 02/16/2020: TSH 2.32 03/10/2020: ALT 20 03/30/2020: BUN 44; Creatinine, Ser 1.50; Hemoglobin 10.1; Platelets 208; Potassium 5.4; Sodium 130    Lipid Panel    Component Value Date/Time   CHOL 124 01/05/2020 1253   CHOL 165 10/16/2016 1213   TRIG 73 01/05/2020 1253   HDL 44 (L) 01/05/2020 1253   HDL 31 (L) 10/16/2016 1213   CHOLHDL 2.8 01/05/2020 1253   VLDL 12 07/07/2019 1354   LDLCALC 65 01/05/2020 1253      Wt Readings from Last 3 Encounters:  03/30/20 197 lb (89.4 kg)  03/25/20 197 lb (89.4 kg)  03/23/20 197 lb (89.4 kg)      Other studies Reviewed: Echo 01/31/2020 IMPRESSIONS  1. Left ventricular ejection fraction, by estimation, is 30 to 35%. The  left ventricle has moderately decreased function. The left ventricle  demonstrates global hypokinesis. There is mild concentric left ventricular  hypertrophy. Left ventricular  diastolic function could not be evaluated.  2. Right ventricular systolic  function is normal. The right ventricular  size is normal. There is severely elevated pulmonary artery systolic  pressure. The estimated right ventricular systolic pressure is 40.9 mmHg.  3. Left atrial size was severely dilated.  4. Right atrial size was moderately dilated.   5. The mitral valve has been repaired/replaced. No evidence of mitral  valve regurgitation.  6. The aortic valve has been repaired/replaced. Aortic valve  regurgitation is not visualized.  _____________    ASSESSMENT AND PLAN:  1.  ***   Current medicines are reviewed at length with the patient today.  I have spent *** dedicated to the care of this patient on the date of this encounter to include pre-visit review of records, assessment, management and diagnostic testing,with shared decision making.  Labs/ tests ordered today include: *** Phill Myron. West Pugh, ANP, AACC   03/30/2020 1:03 PM    [facility] [place] [address] Office [phone] Fax [phone]  Notice: This dictation was prepared with Dragon dictation along with smaller phrase technology. Any transcriptional errors that result from this process are unintentional and may not be corrected upon review.

## 2020-03-30 NOTE — ED Triage Notes (Signed)
Pt c/o bilateral feet swelling x3 days.

## 2020-03-30 NOTE — H&P (Addendum)
History and Physical    Lindalou Soltis FMB:[id number] DOB: [birth year] DOA: 03/30/2020  PCP: Fayrene Helper, MD   Patient coming from: Home  I have personally briefly reviewed patient's old medical records in [place]  Chief Complaint: Lower extremity swelling, difficulty breathing  HPI: Monifa Blanchette is a 86 y.o. female with medical history significant for COPD, diastolic CHF, on chronic 2 L of oxygen, complete heart block, AICD/pacemaker status, aortic and mitral valve replacement on chronic anticoagulation, diabetes mellitus. Patient presented to the ED with complaints of bilateral lower extremity swelling over the past 3 to 4 days, she also reports difficulty breathing worse with exertion improved with rest.  She reports compliance with her medications which include Lasix.  No chest pain.  Reports a cough mostly at night, productive of whitish sputum. Patient lives with her nephew who has to go to work.  At baseline patient ambulates with a walker.  ED Course: Blood pressure systolic 701X to 793J, O2 sats 86% on room air, improved to greater than 94% on patient's baseline 2 L of oxygen.  Sodium 130, creatinine 1.5, BNP 502.  Port X-ray without acute abnormality.  In the ED, patient was ambulated, she was unsteady, she ambulated 10 to 12 feet with assist x2, patient's breathing seemed labored.  Lasix 40 mg x 1 given.  Hospitalist to admit for further evaluation and management  Review of Systems: As per HPI all other systems reviewed and negative.  Past Medical History:  Diagnosis Date  . Acute on chronic diastolic CHF (congestive heart failure) ([place]) 05/19/2012  . Acute on chronic respiratory failure with hypoxia ([place]) 01/31/2020  . Adenomatous polyp of colon 12/16/2002   Dr. [name]/[facility]  . Allergy   . Calculus of gallbladder with chronic cholecystitis without obstruction 02/09/2009   Qualifier: Diagnosis of  By: [name], [name]    .  Cataract   . CHB (complete heart block) ([place]) 10/07/2014      . CHF (congestive heart failure) ([place])    a.  EF previously reduced to 25-30% b. EF improved to 60-65% by echo in 10/2017.   Marland Kitchen Chronic kidney disease, stage I 2006  . Constipation       . COPD (chronic obstructive pulmonary disease) ([place])       . DJD (degenerative joint disease) of lumbar spine   . DM (diabetes mellitus) ([place]) 2006  . Esophageal reflux   . Glaucoma   . Gout       . H/O adenomatous polyp of colon 05/24/2012   2004 in [place] colonoscopy 2008 normal    . H/O heart valve replacement with mechanical valve    a. s/p mechanical AVR in 2001 and mechanical MVR in 2004.  Marland Kitchen H/O wisdom tooth extraction   . HIP PAIN, RIGHT 04/23/2009   Qualifier: Diagnosis of  By: [name], [name]    . Hyperlipemia   . IDA (iron deficiency anemia)    Parenteral iron/[name]  . Insomnia       . Nausea without vomiting 06/07/2010   Qualifier: Diagnosis of  By: [name], [name]    . Non-ischemic cardiomyopathy ([place])    a. s/p MDT CRTD  . Obesity, unspecified   . Other and unspecified hyperlipidemia   . Oxygen deficiency 2014   nocturnal;  . Papilloma of breast 03/06/2012   This was diagnosed as a left breast papilloma by ultrasound characteristics and symptoms in 2010. Because of possible medical  risk factors no surgical intervention has been done and it did doing annual followups. The area has been stable by physical examination and mammograms and ultrasound since 2010.   Marland Kitchen Spinal stenosis of lumbar region 02/19/2013  . Spondylolisthesis   . Spondylosis   . Thyroid cancer ([place])    remote thyroidectomy, no recurrence, pt denies in 2016 thyroid cancer  . Type 2 diabetes mellitus with hyperglycemia ([place]) 07/15/2010  . Unspecified hypothyroidism       . 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 Vertigo         Past Surgical History:  Procedure Laterality Date  . AORTIC AND MITRAL  VALVE REPLACEMENT  2001  . BI-VENTRICULAR IMPLANTABLE CARDIOVERTER DEFIBRILLATOR UPGRADE N/A 01/18/2015   Procedure: BI-VENTRICULAR IMPLANTABLE CARDIOVERTER DEFIBRILLATOR UPGRADE;  Surgeon: Evans Lance, MD;  Location: [facility] CATH LAB;  Service: Cardiovascular;  Laterality: N/A;  . BIV ICD GENERATOR CHANGEOUT N/A 01/30/2019   Procedure: BIV ICD GENERATOR CHANGEOUT;  Surgeon: Evans Lance, MD;  Location: [place] CV LAB;  Service: Cardiovascular;  Laterality: N/A;  . CARDIAC CATHETERIZATION    . CARDIAC VALVE REPLACEMENT    . CARDIOVERSION N/A 11/13/2016   Procedure: CARDIOVERSION;  Surgeon: Sanda Klein, MD;  Location: [facility] ENDOSCOPY;  Service: Cardiovascular;  Laterality: N/A;  . COLONOSCOPY  06/12/2007   [place]k- Normal rectum/Normal colon  . COLONOSCOPY  12/16/02   small hemorrhoids  . COLONOSCOPY  06/20/2012   Procedure: COLONOSCOPY;  Surgeon: Daneil Dolin, MD;  Location: [facility] ENDO [facility];  Service: Endoscopy;  Laterality: N/A;  10:45  . defibrillator implanted 2006    . DOPPLER ECHOCARDIOGRAPHY  2012  . DOPPLER ECHOCARDIOGRAPHY  05,06,07,08,09,2011  . ESOPHAGOGASTRODUODENOSCOPY  06/12/2007   R[place]k- normal esophagus, small hiatal hernia, otherwise normal stomach, D1, D2  . EYE SURGERY Right 2005  . EYE SURGERY Left 2006  . ICD LEAD REMOVAL Left 02/08/2015   Procedure: ICD LEAD REMOVAL;  Surgeon: Evans Lance, MD;  Location: [facility] OR;  Service: Cardiovascular;  Laterality: Left;  "Will plan extraction and insertion of a BiV PM"  **Dr. Roxan Hockey backing up case**  . IMPLANTABLE CARDIOVERTER DEFIBRILLATOR (ICD) GENERATOR CHANGE Left 02/08/2015   Procedure: ICD GENERATOR CHANGE;  Surgeon: Evans Lance, MD;  Location: [place];  Service: Cardiovascular;  Laterality: Left;  . INSERT / REPLACE / REMOVE PACEMAKER    . PACEMAKER INSERTION  May 2016  . pacemaker placed  2004  . right breast cyst removed     benign   . right cataract removed     2005  . TEE WITHOUT CARDIOVERSION N/A 11/13/2016    Procedure: TRANSESOPHAGEAL ECHOCARDIOGRAM (TEE);  Surgeon: Sanda Klein, MD;  Location[facility] ENDOSCOPY;  Service: Cardiovascular;  Laterality: N/A;  . THYROIDECTOMY       reports that she quit smoking about 33 years ago. Her smoking use included cigarettes. She has a 30.00 pack-year smoking history. She has never used smokeless tobacco. She reports that she does not drink alcohol and does not use drugs.  Allergies  Allergen Reactions  . Penicillins Other (See Comments)    Bruise    . Aspirin Other (See Comments)    On coumadin  . Fentanyl Dermatitis    Rash with patch   . Niacin Itching    Family History  Problem Relation Age of Onset  . Pancreatic cancer Mother   . Heart disease Father   . Heart disease Sister   . Heart attack Sister   .  Heart disease Brother   . Diabetes Brother   . Heart disease Brother   . Diabetes Brother   . Hypertension Brother   . Lung cancer Brother     Prior to Admission medications   Medication Sig Start Date End Date Taking? Authorizing Provider  acetaminophen (TYLENOL) 500 MG tablet Take 500 mg by mouth every 6 (six) hours as needed (pain).   Yes [provider]  albuterol (PROVENTIL) (2.5 MG/3ML) 0.083% nebulizer solution 90INHALE 1 VIAL VIA NEBULIZER THREE TIMES DAILY. Patient taking differently: Take 2.5 mg by nebulization in the morning and at bedtime. *May use additional one time if needed for shortness of breath 01/12/20  Yes Fayrene Helper, MD  albuterol (VENTOLIN HFA) 108 (90 Base) MCG/ACT inhaler INHALE 2 PUFFS INTO THE LUNGS EVERY 6 HOURS AS NEEDED FOR WHEEZING OR SHORTNESS OF BREATH Patient taking differently: Inhale 1-2 puffs into the lungs every 6 (six) hours as needed for wheezing or shortness of breath.  08/25/19  Yes Fayrene Helper, MD  allopurinol (ZYLOPRIM) 100 MG tablet TAKE 1 TABLET(100 MG) BY MOUTH DAILY Patient taking differently: Take 100 mg by mouth in the morning.  12/30/19  Yes Fayrene Helper, MD    BREO ELLIPTA 100-25 MCG/INH AEPB 1 TAKE 1 INHALATION BY MOUTH ONCE DAILY Patient taking differently: Inhale 1 puff into the lungs in the morning.  08/25/19  Yes Fayrene Helper, MD  cholecalciferol (VITAMIN D3) 25 MCG (1000 UT) tablet Take 1,000 Units by mouth in the morning.    Yes [provider]  docusate sodium (COLACE) 100 MG capsule Take 300 mg by mouth at bedtime.    Yes [provider]  fluticasone (FLONASE) 50 MCG/ACT nasal spray SHAKE LIQUID AND USE 2 SPRAYS IN EACH NOSTRIL DAILY Patient taking differently: Place 2 sprays into both nostrils every morning.  11/24/19  Yes Fayrene Helper, MD  folic acid (FOLVITE) 1 MG tablet TAKE 1 TABLET(1 MG) BY MOUTH DAILY Patient taking differently: Take 1 mg by mouth every evening.  11/10/19  Yes Fayrene Helper, MD  furosemide (LASIX) 40 MG tablet Take 1 tablet (40 mg total) by mouth daily. Patient taking differently: Take 40 mg by mouth every morning.  08/19/19  Yes Fayrene Helper, MD  gabapentin (NEURONTIN) 300 MG capsule Take 1 capsule (300 mg total) by mouth 2 (two) times daily. 03/10/20  Yes Fayrene Helper, MD  hydrALAZINE (APRESOLINE) 25 MG tablet Take 1 tablet (25 mg total) by mouth 2 (two) times daily. 05/19/19  Yes Croitoru, Mihai, MD  isosorbide mononitrate (IMDUR) 30 MG 24 hr tablet Take 0.5 tablets (15 mg total) by mouth every morning. 02/04/20  Yes Pokhrel, Laxman, MD  Liniments (SALONPAS PAIN RELIEF PATCH EX) Apply 1 patch topically daily as needed (pain).   Yes [provider]  meclizine (ANTIVERT) 12.5 MG tablet TAKE 1 TABLET(12.5 MG) BY MOUTH THREE TIMES DAILY AS NEEDED FOR DIZZINESS Patient taking differently: Take 12.5 mg by mouth 3 (three) times daily as needed for dizziness or nausea.  03/01/20  Yes Fayrene Helper, MD  metolazone (ZAROXOLYN) 2.5 MG tablet Take one tablet on Saturdays. Patient taking differently: Take 2.5 mg by mouth every Saturday.  11/24/19  Yes Croitoru, Mihai, MD   metoprolol succinate (TOPROL-XL) 100 MG 24 hr tablet TAKE 1 TABLET BY MOUTH EVERY DAY WITH FOOD OR MILK Patient taking differently: Take 100 mg by mouth every evening.  11/17/19  Yes Fayrene Helper, MD  nitroGLYCERIN (NITROSTAT)  0.4 MG SL tablet Place 1 tablet (0.4 mg total) under the tongue every 5 (five) minutes as needed for chest pain. 08/03/19  Yes Rolland Porter, MD  ondansetron (ZOFRAN ODT) 4 MG disintegrating tablet Take 1 tablet (4 mg total) by mouth every 8 (eight) hours as needed for nausea or vomiting. 01/26/20  Yes Fawze, Mina A, PA-C  Polyethyl [name] (SYSTANE) 0.4-0.3 % GEL ophthalmic gel Place 1 application into the left eye at bedtime.   Yes [provider]  potassium chloride SA (KLOR-CON) 20 MEQ tablet TAKE 1 TABLET(20 MEQ) BY MOUTH DAILY Patient taking differently: Take 20 mEq by mouth daily.  03/16/20  Yes Fayrene Helper, MD  pravastatin (PRAVACHOL) 40 MG tablet TAKE 1 TABLET BY MOUTH EVERY EVENING Patient taking differently: Take 40 mg by mouth every evening.  11/10/19  Yes Fayrene Helper, MD  SYNTHROID 75 MCG tablet TAKE 1 TABLET BY MOUTH DAILY Patient taking differently: Take 75 mcg by mouth daily before breakfast.  10/27/19  Yes Fayrene Helper, MD  warfarin (JANTOVEN) 5 MG tablet TAKE 2 TABLETS EVERY DAY EXCEPT 1 TABLET ON SATURDAY Patient taking differently: Take 5-10 mg by mouth See admin instructions. TAKE 2 TABLETS EVERY DAY EXCEPT 1 TABLET ON TUESDAYS @ 8:30PM 11/10/19  Yes Evans Lance, MD  polyethylene glycol powder (GLYCOLAX/MIRALAX) 17 GM/SCOOP powder Take 17 g by mouth daily as needed for mild constipation or moderate constipation.    [provider]    Physical Exam: Vitals:   03/30/20 1900 03/30/20 1930 03/30/20 2000 03/30/20 2104  BP: (!) 159/85  (!) 142/61 (!) 177/78  Pulse: (!) 111 (!) 59 (!) 58 60  Resp: (!) 26 (!) 31 16 18   Temp:    98.2 F (36.8 C)  TempSrc:    Oral  SpO2: 92% 97% 99% 94%  Weight:       Height:        Constitutional: NAD, calm, comfortable Vitals:   03/30/20 1900 03/30/20 1930 03/30/20 2000 03/30/20 2104  BP: (!) 159/85  (!) 142/61 (!) 177/78  Pulse: (!) 111 (!) 59 (!) 58 60  Resp: (!) 26 (!) 31 16 18   Temp:    98.2 F (36.8 C)  TempSrc:    Oral  SpO2: 92% 97% 99% 94%  Weight:      Height:       Eyes: PERRL, lids and conjunctivae normal ENMT: Mucous membranes are moist.  Neck: normal, supple, no masses, no thyromegaly Respiratory: clear to auscultation bilaterally, no wheezing, no crackles. Normal respiratory effort. No accessory muscle use.  Cardiovascular: 2+ pitting pedal edema to mid leg, distended neck veins, murmur present, 2+ pedal pulses.  Abdomen: no tenderness, no masses palpated. No hepatosplenomegaly. Bowel sounds positive.  Musculoskeletal: no clubbing / cyanosis. No joint deformity upper and lower extremities. Good ROM, no contractures. Normal muscle tone.  Skin: no rashes, lesions, ulcers. No induration Neurologic: Moving all extremities spontaneously, no apparent cranial nerve abnormality. Psychiatric: Normal judgment and insight. Alert and oriented x 3. Normal mood.   Labs on Admission: I have personally reviewed following labs and imaging studies  CBC: Recent Labs  Lab 03/30/20 1202  WBC 6.3  HGB 10.1*  HCT 32.8*  MCV 95.3  PLT 277   Basic Metabolic Panel: Recent Labs  Lab 03/30/20 1202  NA 130*  K 5.4*  CL 90*  CO2 31  GLUCOSE 97  BUN 44*  CREATININE 1.50*  CALCIUM 9.4   Liver Function Tests: Recent  Labs  Lab 03/30/20 1202  AST 28  ALT 20  ALKPHOS 51  BILITOT 0.4  PROT 7.5  ALBUMIN 3.8   CBG: Recent Labs  Lab 03/30/20 2115  GLUCAP 95    Radiological Exams on Admission: DG Chest Portable 1 View  Result Date: 03/30/2020 CLINICAL DATA:  Shortness of breath and CHF EXAM: PORTABLE CHEST 1 VIEW COMPARISON:  Jan 30, 2020 FINDINGS: Again noted is stable cardiomegaly. Aortic knob calcifications are seen. Overlying  median sternotomy wires and prosthetic valves are noted. A left-sided pacemaker noted with the lead tips in the right atrium and right ventricle. Mildly increased interstitial markings are again noted throughout both lungs, likely chronic lung changes. No new airspace consolidation or pleural effusion. Advanced left shoulder osteoarthritis is seen with cystic/erosive type change at the humeral head. IMPRESSION: No active disease.  Unchanged cardiomegaly Electronically Signed   By: Prudencio Pair M.D.   On: 03/30/2020 14:48    EKG: Independently reviewed.  Paced rhythm.  Assessment/Plan Active Problems:   Decompensated heart failure (HCC)    Acute on chronic combined systolic and diastolic CHF- 2+ extremity edema, distended neck veins, BNP not significantly changed from prior at 502. Port Chest x-ray without acute abnormality.  Portable chest x-ray shows 15lb weight increase over the past 3 weeks. Last echo 01/2020 EF 30 to 35%.  Reports compliance with Lasix 40 mg daily.  She is also on 2.5 mg metolazone once weekly.  She is currently on her baseline 2 L O2, sats greater than 94%.  Follows with Dr. Sallyanne Kuster and Dr. Lovena Le, on recent hospitalization 01/2020, cardiology was consulted for low EF, it was thought that this was likely secondary to lack of biventricular pacing.  Patient was to follow-up as an outpatient. -Continue IV Lasix 40 mg every 12 hourly -Strict input output, daily weight, fluid restriction -Cardiology consult -Check INR -Daily BMP  Hyponatremia-sodium 130, recent baseline 1 34-137.  Likely due to volume overload, diuretics.  COPD with chronic respiratory failure.  Stable.  On 2 L O2. -Resume home bronchodilators.  Essential hypertension:Elevated. - Continue hydralazine, Imdur, metoprolol. -As needed hydralazine.  Complete heart block s/p pacemaker: - Continue metoprolol at home dose.  Stage IIIb CKD:.  Creatinine 1.5, baseline 1.2-1.3 -IV Lasix, diurese at this  time  S/p mechanical mitral and aortic valve: -Check INR -Warfarin pharmacy to dose  Persistent atrial flutter/A. fib: Continue Toprol-XL and Coumadin anticoagulation.Rate controlled at this time. -Resume warfarin, pharmacy to dose  Anemia:Anemia of chronic disease. Hemoglobin of 10.1 at baseline.  Postsurgical hypothyroidism:  - Continue Synthroid.  Gout-  -resume allopurinol.  Controlled diabetes mellitus- not on medication, random glucose 97.   Recent Hgba1c 5.8. -Daily fasting blood glucose   DVT prophylaxis: Warfarin. Code Status: DNR, confirmed with patient at bedside. Family Communication: None at bedside. Disposition Plan: ~ 2 days.  Pending response to diuresis, cardiology evaluation. Consults called: Cardiology. Admission status: Observation, telemetry.   Bethena Roys MD [facility]  03/30/2020, 11:52 PM

## 2020-03-30 NOTE — ED Notes (Signed)
Ambulated , pulse ox 96-99 % . Pt very unsteady when ambulating. Only ambulated 10-12 foot and with assist x 2. Pt's breathing seemed labored even sat remained up.

## 2020-03-30 NOTE — ED Provider Notes (Signed)
[facility] EMERGENCY DEPARTMENT Provider Note   CSN: [id number] Arrival date & time: 03/30/20  1118     History Chief Complaint  Patient presents with  . Leg Swelling    [name] [name] is a 86 y.o. female.  HPI Patient is a 86 year old female with a medical history as noted below including diastolic heart failure on Lasix, COPD on 2 L nasal cannula at home.  Patient states she has been compliant with all of her regular medications, including her 40 mg of daily Lasix.  She states about 3 days ago she began experiencing worsening pedal edema.  She reports some mild shortness of breath that is worse with exertion.  Pt additionally complains of mild left shoulder pain that is worse with movement. She denies fevers, chills, chest pain, abdominal pain, nausea, vomiting, diarrhea, urinary changes, syncope.    Past Medical History:  Diagnosis Date  . Acute on chronic diastolic CHF (congestive heart failure) ([place]) 05/19/2012  . Acute on chronic respiratory failure with hypoxia ([place]) 01/31/2020  . Adenomatous polyp of colon 12/16/2002   Dr. [name]/[facility]  . Allergy   . Calculus of gallbladder with chronic cholecystitis without obstruction 02/09/2009   Qualifier: Diagnosis of  By: [name], [name]    . Cataract   . CHB (complete heart block) ([place]) 10/07/2014      . CHF (congestive heart failure) ([place])    a.  EF previously reduced to 25-30% b. EF improved to 60-65% by echo in 10/2017.   Marland Kitchen Chronic kidney disease, stage I 2006  . Constipation       . COPD (chronic obstructive pulmonary disease) ([place])       . DJD (degenerative joint disease) of lumbar spine   . DM (diabetes mellitus) ([place]) 2006  . Esophageal reflux   . Glaucoma   . Gout       . H/O adenomatous polyp of colon 05/24/2012   2004 in [place] colonoscopy 2008 normal    . H/O heart valve replacement with mechanical valve    a. s/p mechanical AVR in 2001 and mechanical MVR in 2004.  Marland Kitchen  H/O wisdom tooth extraction   . HIP PAIN, RIGHT 04/23/2009   Qualifier: Diagnosis of  By: [name], [name]    . Hyperlipemia   . IDA (iron deficiency anemia)    Parenteral iron/[name]  . Insomnia       . Nausea without vomiting 06/07/2010   Qualifier: Diagnosis of  By: [name], [name]    . Non-ischemic cardiomyopathy ([place])    a. s/p MDT CRTD  . Obesity, unspecified   . Other and unspecified hyperlipidemia   . Oxygen deficiency 2014   nocturnal;  . Papilloma of breast 03/06/2012   This was diagnosed as a left breast papilloma by ultrasound characteristics and symptoms in 2010. Because of possible medical risk factors no surgical intervention has been done and it did doing annual followups. The area has been stable by physical examination and mammograms and ultrasound since 2010.   Marland Kitchen Spinal stenosis of lumbar region 02/19/2013  . Spondylolisthesis   . Spondylosis   . Thyroid cancer ([place])    remote thyroidectomy, no recurrence, pt denies in 2016 thyroid cancer  . Type 2 diabetes mellitus with hyperglycemia ([place]) 07/15/2010  . Unspecified hypothyroidism       . 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 Vertigo         Patient  Active Problem List   Diagnosis Date Noted  . Right-sided epistaxis 03/28/2020  . Acute pain of left shoulder 01/15/2020  . AAA (abdominal aortic aneurysm) without rupture ([place]) 01/14/2020  . COVID-19 virus detected 11/23/2019  . Dyspnea 05/30/2019  . Anemia 05/30/2019  . Gout   . Glaucoma   . GERD (gastroesophageal reflux disease)   . Physical debility 04/28/2019  . Encounter for examination following treatment at hospital 11/03/2018  . Hyponatremia 09/27/2018  . H/O mitral valve replacement with mechanical valve 08/02/2018  . Chronic right hip pain 04/16/2018  . Chronic systolic congestive heart failure ([place]) 11/15/2017  . Trochanteric bursitis, right hip 03/07/2017  . Persistent atrial fibrillation ([place])   .  Nocturnal hypoxia 10/08/2016  . Dependence on nocturnal oxygen therapy 10/08/2016  . Hematuria 03/20/2016  . S/P ICD (internal cardiac defibrillator) procedure 02/08/2015  . Presence of cardiac pacemaker 02/08/2015  . CHB (complete heart block) ([place]) 10/07/2014  . Fatigue 07/20/2014  . Absolute anemia 06/24/2014  . Osteopenia 02/10/2014  . Encounter for therapeutic drug monitoring 10/29/2013  . OA (osteoarthritis) of knee 02/19/2013  . Chronic pain of right knee 01/02/2013  . Hemolytic anemia ([place]) 09/24/2012  . High risk medication use 05/24/2012  . COPD (chronic obstructive pulmonary disease) ([place]) 05/19/2012  . Chronic anticoagulation, on coumadin for Mechanical AVR and MVR 04/01/2012  . Cardiorenal syndrome with renal failure 03/22/2012  . Hx of aortic valve replacement, mechanical 03/22/2012  . S/P MVR (mitral valve replacement) 03/22/2012  . Biventricular automatic implantable cardioverter defibrillator in situ 03/22/2012  . CKD (chronic kidney disease), stage IV ([place]) 03/22/2012  . Warfarin-induced coagulopathy ([place]) 03/21/2012  . Iron deficiency 10/04/2011  . Allergic rhinitis 02/14/2011  . Hypothyroidism 07/15/2010  . Mitral valve disease 07/15/2010  . Sinus node dysfunction ([place]) 07/15/2010  . VARICOSE VEINS LOWER EXTREMITIES W/OTH COMPS 02/09/2009  . Prediabetes 06/15/2008  . Hypothyroidism, postsurgical 02/24/2008  . Hyperlipidemia LDL goal <70 02/24/2008  . Essential hypertension 02/24/2008  . Non-ischemic cardiomyopathy with ICD 02/24/2008  . DEGENERATIVE JOINT DISEASE, SPINE 02/24/2008    Past Surgical History:  Procedure Laterality Date  . AORTIC AND MITRAL VALVE REPLACEMENT  2001  . BI-VENTRICULAR IMPLANTABLE CARDIOVERTER DEFIBRILLATOR UPGRADE N/A 01/18/2015   Procedure: BI-VENTRICULAR IMPLANTABLE CARDIOVERTER DEFIBRILLATOR UPGRADE;  Surgeon: Evans Lance, MD;  Location: [facility] CATH LAB;  Service: Cardiovascular;  Laterality: N/A;  . BIV ICD GENERATOR CHANGEOUT  N/A 01/30/2019   Procedure: BIV ICD GENERATOR CHANGEOUT;  Surgeon: Evans Lance, MD;  Location: [place] CV LAB;  Service: Cardiovascular;  Laterality: N/A;  . CARDIAC CATHETERIZATION    . CARDIAC VALVE REPLACEMENT    . CARDIOVERSION N/A 11/13/2016   Procedure: CARDIOVERSION;  Surgeon: Sanda Klein, MD;  Location: [facility] ENDOSCOPY;  Service: Cardiovascular;  Laterality: N/A;  . COLONOSCOPY  06/12/2007   [place]- Normal rectum/Normal colon  . COLONOSCOPY  12/16/02   small hemorrhoids  . COLONOSCOPY  06/20/2012   Procedure: COLONOSCOPY;  Surgeon: Daneil Dolin, MD;  Location: [facility] ENDO SUITE;  Service: Endoscopy;  Laterality: N/A;  10:45  . defibrillator implanted 2006    . DOPPLER ECHOCARDIOGRAPHY  2012  . DOPPLER ECHOCARDIOGRAPHY  05,06,07,08,09,2011  . ESOPHAGOGASTRODUODENOSCOPY  06/12/2007   Rourk- normal esophagus, small hiatal hernia, otherwise normal stomach, D1, D2  . EYE SURGERY Right 2005  . EYE SURGERY Left 2006  . ICD LEAD REMOVAL Left 02/08/2015   Procedure: ICD LEAD REMOVAL;  Surgeon: Evans Lance, MD;  Location: [facility] [place];  Service: Cardiovascular;  Laterality: Left;  "Will plan extraction and insertion of a BiV PM"  **Dr. Roxan Hockey backing up case**  . IMPLANTABLE CARDIOVERTER DEFIBRILLATOR (ICD) GENERATOR CHANGE Left 02/08/2015   Procedure: ICD GENERATOR CHANGE;  Surgeon: Evans Lance, MD;  Location: [place];  Service: Cardiovascular;  Laterality: Left;  . INSERT / REPLACE / REMOVE PACEMAKER    . PACEMAKER INSERTION  May 2016  . pacemaker placed  2004  . right breast cyst removed     benign   . right cataract removed     2005  . TEE WITHOUT CARDIOVERSION N/A 11/13/2016   Procedure: TRANSESOPHAGEAL ECHOCARDIOGRAM (TEE);  Surgeon: Sanda Klein, MD;  Location: [facility] ENDOSCOPY;  Service: Cardiovascular;  Laterality: N/A;  . THYROIDECTOMY       OB History    Gravida      Para      Term      Preterm      AB      Living  0     SAB      TAB      Ectopic       Multiple      Live Births              Family History  Problem Relation Age of Onset  . Pancreatic cancer Mother   . Heart disease Father   . Heart disease Sister   . Heart attack Sister   . Heart disease Brother   . Diabetes Brother   . Heart disease Brother   . Diabetes Brother   . Hypertension Brother   . Lung cancer Brother     Social History   Tobacco Use  . Smoking status: Former Smoker    Packs/day: 0.75    Years: 40.00    Pack years: 30.00    Types: Cigarettes    Quit date: 03/08/1987    Years since quitting: 33.0  . Smokeless tobacco: Never Used  Vaping Use  . Vaping Use: Never used  Substance Use Topics  . Alcohol use: No    Alcohol/week: 0.0 standard drinks  . Drug use: No    Home Medications Prior to Admission medications   Medication Sig Start Date End Date Taking? Authorizing Provider  acetaminophen (TYLENOL) 500 MG tablet Take 500 mg by mouth every 6 (six) hours as needed (pain).    [provider]  albuterol (PROVENTIL) (2.5 MG/3ML) 0.083% nebulizer solution 90INHALE 1 VIAL VIA NEBULIZER THREE TIMES DAILY. Patient taking differently: Take 2.5 mg by nebulization in the morning and at bedtime. *May use additional one time if needed for shortness of breath 01/12/20   Fayrene Helper, MD  albuterol (VENTOLIN HFA) 108 (90 Base) MCG/ACT inhaler INHALE 2 PUFFS INTO THE LUNGS EVERY 6 HOURS AS NEEDED FOR WHEEZING OR SHORTNESS OF BREATH Patient taking differently: Inhale 1-2 puffs into the lungs every 6 (six) hours as needed for wheezing or shortness of breath.  08/25/19   Fayrene Helper, MD  allopurinol (ZYLOPRIM) 100 MG tablet TAKE 1 TABLET(100 MG) BY MOUTH DAILY Patient taking differently: Take 100 mg by mouth in the morning.  12/30/19   Fayrene Helper, MD  BREO ELLIPTA 100-25 MCG/INH AEPB 1 TAKE 1 INHALATION BY MOUTH ONCE DAILY Patient taking differently: Inhale 1 puff into the lungs in the morning.  08/25/19   Fayrene Helper, MD   cholecalciferol (VITAMIN D3) 25 MCG (1000 UT) tablet Take 1,000 Units by mouth in the morning.     [provider]  docusate sodium (COLACE) 100 MG capsule Take 300 mg by mouth at bedtime.     [provider]  fluticasone (FLONASE) 50 MCG/ACT nasal spray SHAKE LIQUID AND USE 2 SPRAYS IN EACH NOSTRIL DAILY Patient taking differently: Place 2 sprays into both nostrils every morning.  11/24/19   Fayrene Helper, MD  folic acid (FOLVITE) 1 MG tablet TAKE 1 TABLET(1 MG) BY MOUTH DAILY Patient taking differently: Take 1 mg by mouth every evening.  11/10/19   Fayrene Helper, MD  furosemide (LASIX) 40 MG tablet Take 1 tablet (40 mg total) by mouth daily. Patient taking differently: Take 40 mg by mouth every morning.  08/19/19   Fayrene Helper, MD  gabapentin (NEURONTIN) 300 MG capsule Take 1 capsule (300 mg total) by mouth 2 (two) times daily. 03/10/20   Fayrene Helper, MD  hydrALAZINE (APRESOLINE) 25 MG tablet Take 1 tablet (25 mg total) by mouth 2 (two) times daily. 05/19/19   Croitoru, Mihai, MD  isosorbide mononitrate (IMDUR) 30 MG 24 hr tablet Take 0.5 tablets (15 mg total) by mouth every morning. 02/04/20   Pokhrel, Corrie Mckusick, MD  Liniments (SALONPAS PAIN RELIEF PATCH EX) Apply 1 patch topically daily as needed (pain).    [provider]  meclizine (ANTIVERT) 12.5 MG tablet TAKE 1 TABLET(12.5 MG) BY MOUTH THREE TIMES DAILY AS NEEDED FOR DIZZINESS 03/01/20   Fayrene Helper, MD  metolazone (ZAROXOLYN) 2.5 MG tablet Take one tablet on Saturdays. Patient taking differently: Take 2.5 mg by mouth every Saturday.  11/24/19   Croitoru, Mihai, MD  metoprolol succinate (TOPROL-XL) 100 MG 24 hr tablet TAKE 1 TABLET BY MOUTH EVERY DAY WITH FOOD OR MILK Patient taking differently: Take 100 mg by mouth every evening.  11/17/19   Fayrene Helper, MD  Multiple Vitamin (MULTIVITAMIN) LIQD Take 5 mLs by mouth daily.    [provider]  nitroGLYCERIN (NITROSTAT) 0.4  MG SL tablet Place 1 tablet (0.4 mg total) under the tongue every 5 (five) minutes as needed for chest pain. 08/03/19   Rolland Porter, MD  ondansetron (ZOFRAN ODT) 4 MG disintegrating tablet Take 1 tablet (4 mg total) by mouth every 8 (eight) hours as needed for nausea or vomiting. 01/26/20   Fawze, Mina A, PA-C  Polyethyl [name] (SYSTANE) 0.4-0.3 % GEL ophthalmic gel Place 1 application into the left eye at bedtime.    [provider]  polyethylene glycol powder (GLYCOLAX/MIRALAX) 17 GM/SCOOP powder Take 17 g by mouth daily as needed for mild constipation or moderate constipation.    [provider]  potassium chloride SA (KLOR-CON) 20 MEQ tablet TAKE 1 TABLET(20 MEQ) BY MOUTH DAILY 03/16/20   Fayrene Helper, MD  pravastatin (PRAVACHOL) 40 MG tablet TAKE 1 TABLET BY MOUTH EVERY EVENING Patient taking differently: Take 40 mg by mouth every evening.  11/10/19   Fayrene Helper, MD  SYNTHROID 75 MCG tablet TAKE 1 TABLET BY MOUTH DAILY Patient taking differently: Take 75 mcg by mouth daily before breakfast.  10/27/19   Fayrene Helper, MD  warfarin (JANTOVEN) 5 MG tablet TAKE 2 TABLETS EVERY DAY EXCEPT 1 TABLET ON SATURDAY Patient taking differently: Take 5-10 mg by mouth See admin instructions. TAKE 2 TABLETS EVERY DAY EXCEPT 1 TABLET ON Saturday @ 8:30PM 11/10/19   Evans Lance, MD    Allergies    Penicillins, Aspirin, Fentanyl, and Niacin  Review of Systems   Review of Systems Ten systems reviewed and  are negative for acute change, except as noted in the HPI.   Physical Exam Updated Vital Signs BP (!) 156/65   Pulse (!) 118   Temp 98.4 F (36.9 C) (Oral)   Resp (!) 29   Ht 5\' 7"  (1.702 m)   Wt 89.4 kg   SpO2 90%   BMI 30.85 kg/m   Physical Exam Updated Vital Signs BP (!) 151/71 (BP Location: Right Arm)   Pulse 60   Temp 98.4 F (36.9 C) (Oral)   Resp 20   Ht 5\' 7"  (1.702 m)   Wt 89.4 kg   SpO2 95%   BMI 30.85 kg/m   Physical  Exam Vitals and nursing note reviewed.  Constitutional:      General: She is not in acute distress.    Appearance: Normal appearance. She is not ill-appearing, toxic-appearing or diaphoretic.     Comments: Well-developed elderly female lying in the semi-Fowlers position.  She speaks clearly and coherently.  HENT:     Head: Normocephalic and atraumatic.     Right Ear: External ear normal.     Left Ear: External ear normal.     Nose: Nose normal.     Mouth/Throat:     Mouth: Mucous membranes are moist.     Pharynx: Oropharynx is clear. No oropharyngeal exudate or posterior oropharyngeal erythema.  Eyes:     General: No scleral icterus.       Right eye: No discharge.        Left eye: No discharge.     Extraocular Movements: Extraocular movements intact.     Conjunctiva/sclera: Conjunctivae normal.  Cardiovascular:     Rate and Rhythm: Normal rate and regular rhythm.     Pulses: Normal pulses.     Heart sounds: Normal heart sounds. No murmur heard.  No friction rub. No gallop.   Pulmonary:     Effort: Pulmonary effort is normal. No respiratory distress.     Breath sounds: No stridor. Rales present. No wheezing or rhonchi.     Comments: Mild rales noted in the bilateral lung bases, left greater than right. Abdominal:     General: Abdomen is flat.     Palpations: Abdomen is soft.     Tenderness: There is no abdominal tenderness.  Musculoskeletal:        General: No tenderness. Normal range of motion.     Cervical back: Normal range of motion and neck supple. No tenderness.     Right lower leg: Edema present.     Left lower leg: Edema present.     Comments: 2+ pitting edema noted in the bilateral lower extremities  Skin:    General: Skin is warm and dry.  Neurological:     General: No focal deficit present.     Mental Status: She is alert and oriented to person, place, and time.  Psychiatric:        Mood and Affect: Mood normal.        Behavior: Behavior normal.  ED Results /  Procedures / Treatments   Labs (all labs ordered are listed, but only abnormal results are displayed) Labs Reviewed  BASIC METABOLIC PANEL - Abnormal; Notable for the following components:      Result Value   Sodium 130 (*)    Potassium 5.4 (*)    Chloride 90 (*)    BUN 44 (*)    Creatinine, Ser 1.50 (*)    GFR calc non Af Amer 31 (*)  GFR calc Af Amer 36 (*)    All other components within normal limits  CBC - Abnormal; Notable for the following components:   RBC 3.44 (*)    Hemoglobin 10.1 (*)    HCT 32.8 (*)    RDW 17.2 (*)    All other components within normal limits  BRAIN NATRIURETIC PEPTIDE - Abnormal; Notable for the following components:   B Natriuretic Peptide 502.0 (*)    All other components within normal limits  HEPATIC FUNCTION PANEL    EKG EKG Interpretation  Date/Time:  Tuesday March 30 2020 14:31:58 EDT Ventricular Rate:  60 PR Interval:    QRS Duration: 152 QT Interval:  534 QTC Calculation: 534 R Axis:   -49 Text Interpretation: VENTRICULAR PACED RHYTHM Underlying atrial flutter No significant change since 01/30/2020 Confirmed by Veryl Speak [id number]) on 03/30/2020 3:51:12 PM   Radiology DG Chest Portable 1 View  Result Date: 03/30/2020 CLINICAL DATA:  Shortness of breath and CHF EXAM: PORTABLE CHEST 1 VIEW COMPARISON:  Jan 30, 2020 FINDINGS: Again noted is stable cardiomegaly. Aortic knob calcifications are seen. Overlying median sternotomy wires and prosthetic valves are noted. A left-sided pacemaker noted with the lead tips in the right atrium and right ventricle. Mildly increased interstitial markings are again noted throughout both lungs, likely chronic lung changes. No new airspace consolidation or pleural effusion. Advanced left shoulder osteoarthritis is seen with cystic/erosive type change at the humeral head. IMPRESSION: No active disease.  Unchanged cardiomegaly Electronically Signed   By: Prudencio Pair M.D.   On: 03/30/2020 14:48     Procedures Procedures (including critical care time)  Medications Ordered in ED Medications  furosemide (LASIX) injection 40 mg (40 mg Intravenous Given 03/30/20 1513)    ED Course  I have reviewed the triage vital signs and the nursing notes.  Pertinent labs & imaging results that were available during my care of the patient were reviewed by me and considered in my medical decision making (see chart for details).  Clinical Course as of Mar 31 1911  Tue Mar 30, 2020  1509 Similar to baseline values  Hemoglobin(!): 10.1 [LJ]  1510 Similar to baseline values  Creatinine(!): 1.50 [LJ]  1511 Unchanged cardiomegaly without active disease.  Advanced left shoulder osteoarthritis.  DG Chest Portable 1 View [LJ]  1551 B Natriuretic Peptide(!): 502.0 [LJ]    Clinical Course User Index [LJ] Rayna Sexton, PA-C   MDM Rules/Calculators/A&P                          Patient is an 86 year old female with a history, physical exam, ED clinical course as noted above.  Patient initially presented to the emergency department with her home oxygen which was empty and she was saturating at 86%.  She was placed on her baseline 2 L via nasal cannula and has been saturating above 95% through most of the stay today.  Patient was given 40 mg of IV Lasix in the ED today.  I had the nursing staff ambulate the patient and though she maintained her oxygen saturations above 95%, patient was unable to ambulate more than 10-12 steps.  She lives with her nephew and states that he is typically gone more than 12 hours a day and it sounds as if she is otherwise alone.   Due to her elevated BNP, edema in the lower extremities, inability to ambulate at her baseline, discussed with hospitalist for admission.  Hospitalist is going to  evaluate the patient at this time.  COVID-19 test ordered.  Note: Portions of this report may have been transcribed using voice recognition software. Every effort was made to ensure  accuracy; however, inadvertent computerized transcription errors may be present.    Final Clinical Impression(s) / ED Diagnoses Final diagnoses:  Acute on chronic congestive heart failure, unspecified heart failure type ([place])  Bilateral lower extremity edema   Rx / DC Orders ED Discharge Orders    None       Rayna Sexton, PA-C 03/30/20 1913    Hayden Rasmussen, MD 03/31/20 1054

## 2020-03-31 ENCOUNTER — Ambulatory Visit: Payer: Medicare Other | Admitting: Adult Health

## 2020-03-31 DIAGNOSIS — I5043 Acute on chronic combined systolic (congestive) and diastolic (congestive) heart failure: Secondary | ICD-10-CM | POA: Diagnosis present

## 2020-03-31 DIAGNOSIS — I442 Atrioventricular block, complete: Secondary | ICD-10-CM | POA: Diagnosis present

## 2020-03-31 DIAGNOSIS — Z800 Family history of malignant neoplasm of digestive organs: Secondary | ICD-10-CM | POA: Diagnosis not present

## 2020-03-31 DIAGNOSIS — Z95810 Presence of automatic (implantable) cardiac defibrillator: Secondary | ICD-10-CM | POA: Diagnosis not present

## 2020-03-31 DIAGNOSIS — I4819 Other persistent atrial fibrillation: Secondary | ICD-10-CM

## 2020-03-31 DIAGNOSIS — E1122 Type 2 diabetes mellitus with diabetic chronic kidney disease: Secondary | ICD-10-CM | POA: Diagnosis present

## 2020-03-31 DIAGNOSIS — Z888 Allergy status to other drugs, medicaments and biological substances status: Secondary | ICD-10-CM | POA: Diagnosis not present

## 2020-03-31 DIAGNOSIS — N183 Chronic kidney disease, stage 3 unspecified: Secondary | ICD-10-CM | POA: Diagnosis not present

## 2020-03-31 DIAGNOSIS — Z886 Allergy status to analgesic agent status: Secondary | ICD-10-CM | POA: Diagnosis not present

## 2020-03-31 DIAGNOSIS — I509 Heart failure, unspecified: Secondary | ICD-10-CM

## 2020-03-31 DIAGNOSIS — E7849 Other hyperlipidemia: Secondary | ICD-10-CM | POA: Diagnosis present

## 2020-03-31 DIAGNOSIS — I5021 Acute systolic (congestive) heart failure: Secondary | ICD-10-CM | POA: Diagnosis not present

## 2020-03-31 DIAGNOSIS — Z7901 Long term (current) use of anticoagulants: Secondary | ICD-10-CM | POA: Diagnosis not present

## 2020-03-31 DIAGNOSIS — Z20822 Contact with and (suspected) exposure to covid-19: Secondary | ICD-10-CM | POA: Diagnosis present

## 2020-03-31 DIAGNOSIS — Z952 Presence of prosthetic heart valve: Secondary | ICD-10-CM | POA: Diagnosis not present

## 2020-03-31 DIAGNOSIS — R600 Localized edema: Secondary | ICD-10-CM | POA: Diagnosis not present

## 2020-03-31 DIAGNOSIS — I4892 Unspecified atrial flutter: Secondary | ICD-10-CM | POA: Diagnosis present

## 2020-03-31 DIAGNOSIS — D631 Anemia in chronic kidney disease: Secondary | ICD-10-CM | POA: Diagnosis present

## 2020-03-31 DIAGNOSIS — J9611 Chronic respiratory failure with hypoxia: Secondary | ICD-10-CM | POA: Diagnosis present

## 2020-03-31 DIAGNOSIS — Z880 Allergy status to penicillin: Secondary | ICD-10-CM | POA: Diagnosis not present

## 2020-03-31 DIAGNOSIS — Z87891 Personal history of nicotine dependence: Secondary | ICD-10-CM | POA: Diagnosis not present

## 2020-03-31 DIAGNOSIS — Z66 Do not resuscitate: Secondary | ICD-10-CM | POA: Diagnosis present

## 2020-03-31 DIAGNOSIS — Z86010 Personal history of colonic polyps: Secondary | ICD-10-CM | POA: Diagnosis not present

## 2020-03-31 DIAGNOSIS — E871 Hypo-osmolality and hyponatremia: Secondary | ICD-10-CM | POA: Diagnosis present

## 2020-03-31 DIAGNOSIS — J42 Unspecified chronic bronchitis: Secondary | ICD-10-CM

## 2020-03-31 DIAGNOSIS — Z885 Allergy status to narcotic agent status: Secondary | ICD-10-CM | POA: Diagnosis not present

## 2020-03-31 DIAGNOSIS — N184 Chronic kidney disease, stage 4 (severe): Secondary | ICD-10-CM

## 2020-03-31 DIAGNOSIS — I5022 Chronic systolic (congestive) heart failure: Secondary | ICD-10-CM

## 2020-03-31 DIAGNOSIS — I10 Essential (primary) hypertension: Secondary | ICD-10-CM

## 2020-03-31 DIAGNOSIS — I130 Hypertensive heart and chronic kidney disease with heart failure and stage 1 through stage 4 chronic kidney disease, or unspecified chronic kidney disease: Secondary | ICD-10-CM | POA: Diagnosis present

## 2020-03-31 DIAGNOSIS — I5023 Acute on chronic systolic (congestive) heart failure: Secondary | ICD-10-CM

## 2020-03-31 DIAGNOSIS — J449 Chronic obstructive pulmonary disease, unspecified: Secondary | ICD-10-CM | POA: Diagnosis present

## 2020-03-31 LAB — BASIC METABOLIC PANEL
Anion gap: 8 (ref 5–15)
BUN: 40 mg/dL — ABNORMAL HIGH (ref 8–23)
CO2: 32 mmol/L (ref 22–32)
Calcium: 8.9 mg/dL (ref 8.9–10.3)
Chloride: 91 mmol/L — ABNORMAL LOW (ref 98–111)
Creatinine, Ser: 1.4 mg/dL — ABNORMAL HIGH (ref 0.44–1.00)
GFR calc Af Amer: 39 mL/min — ABNORMAL LOW (ref >60)
GFR calc non Af Amer: 34 mL/min — ABNORMAL LOW (ref >60)
Glucose, Bld: 97 mg/dL (ref 70–99)
Potassium: 5 mmol/L (ref 3.5–5.1)
Sodium: 131 mmol/L — ABNORMAL LOW (ref 135–145)

## 2020-03-31 LAB — PROTIME-INR
INR: 1.8 — ABNORMAL HIGH (ref 0.8–1.2)
Prothrombin Time: 20.6 seconds — ABNORMAL HIGH (ref 11.4–15.2)

## 2020-03-31 LAB — GLUCOSE, CAPILLARY
Glucose-Capillary: 100 mg/dL — ABNORMAL HIGH (ref 70–99)
Glucose-Capillary: 122 mg/dL — ABNORMAL HIGH (ref 70–99)
Glucose-Capillary: 105 mg/dL — ABNORMAL HIGH (ref 70–99)

## 2020-03-31 MED ORDER — FLEET ENEMA 7-19 GM/118ML RE ENEM
1.0000 | ENEMA | Freq: Every day | RECTAL | Status: DC | PRN
  Administered 2020-03-31: 1 via RECTAL

## 2020-03-31 MED ORDER — WARFARIN SODIUM 7.5 MG PO TABS
12.5000 mg | ORAL_TABLET | Freq: Once | ORAL | Status: AC
  Administered 2020-03-31: 12.5 mg via ORAL
  Filled 2020-03-31: qty 1

## 2020-03-31 NOTE — Consult Note (Addendum)
Cardiology Consult    Patient ID: [name] [name]; [id number]; 1932-09-19   Admit date: 03/30/2020 Date of Consult: 03/31/2020  Primary Care Provider: Fayrene Helper, MD Primary Cardiologist: Sanda Klein, MD  Primary Electrophysiologist:  Dr. Lovena Le  Patient Profile    [name] [name] is a 86 y.o. female with past medical history of CHB (s/p Medtronic BiV PPM in 01/2019), persistent atrial fibrillation, chronic combined systolic and diastolic CHF (EF previously as low as 20-25%, normalized by repeat echo in 2019 and 02/2019, EF 30-35% by echocardiogram in 01/2020), mechanical AVR in 2001 and mechanical MVR in 2004, HTN, HLD, Type 2 DM and Stage 3 CKD who is being seen today for the evaluation of CHF at the request of Dr. Denton Brick.   History of Present Illness    [name] [name] has been followed closely by [facility] over the past few months as she was hospitalized in 01/2020 for an acute CHF exacerbation. Was discharged on Lasix 40mg  daily and Metolazone 2.5mg  once weekly. She had a device check in the interim which revealed LV output was possibly subthreshold and this was reprogrammed. Was most recently examined by Cadence Furth, PA-C on 03/25/2020 and weight had increased from 189 lbs from her prior visit in 01/2020 to 197 lbs. She reported worsening dyspnea on exertion and edema in the setting of dietary indiscretion. It was recommended she take an extra Metolazone tablet and Lasix that day then resume her normal regimen. Was continued on Hydralazine, Imdur and Toprol-XL in regards to her cardiomyopathy. Was not on an ACE-I/ARB/ARNI due to variable renal function.   She presented to [facility] ED on 03/30/2020 for progressive dyspnea on exertion and lower extremity edema. She has been experiencing a productive cough. Denies any associated chest pain or palpitations. Reports good compliance with her diuretic regimen and denies missing any recent doses.   Initial labs showed WBC 6.3, Hgb 10.1,  platelets 208, Na+ 130, K+ 5.4 and creatinine 1.50. AST 28 and ALT 20. BNP 502. COVID negative. CXR showing cardiomegaly and chronic lung changes with no acute changes.  EKG shows V-paced rhythm, HR 60 with underlying atrial flutter.   She has been started on IV Lasix 40mg  BID with a recorded net output of -2.7L thus far. Weight recorded as being down 3 lbs to 194 lbs. Creatinine improved to 1.40 this AM and K+ 5.0. INR 1.8 and being followed by Pharmacy.   Past Medical History:  Diagnosis Date  . Acute on chronic diastolic CHF (congestive heart failure) ([place]) 05/19/2012  . Acute on chronic respiratory failure with hypoxia ([place]) 01/31/2020  . Adenomatous polyp of colon 12/16/2002   Dr. [name]/[facility]  . Allergy   . Calculus of gallbladder with chronic cholecystitis without obstruction 02/09/2009   Qualifier: Diagnosis of  By: [name], [name]    . Cataract   . CHB (complete heart block) ([place]) 10/07/2014      . CHF (congestive heart failure) ([place])    a.  EF previously reduced to 25-30% b. EF improved to 60-65% by echo in 10/2017.   Marland Kitchen Chronic kidney disease, stage I 2006  . Constipation       . COPD (chronic obstructive pulmonary disease) ([place])       . DJD (degenerative joint disease) of lumbar spine   . DM (diabetes mellitus) ([place]) 2006  . Esophageal reflux   . Glaucoma   . Gout       . H/O adenomatous polyp of  colon 05/24/2012   2004 in [place] Last colonoscopy 2008 normal    . H/O heart valve replacement with mechanical valve    a. s/p mechanical AVR in 2001 and mechanical MVR in 2004.  Marland Kitchen H/O wisdom tooth extraction   . HIP PAIN, RIGHT 04/23/2009   Qualifier: Diagnosis of  By: [name], [name]    . Hyperlipemia   . IDA (iron deficiency anemia)    Parenteral iron/[name]  . Insomnia       . Nausea without vomiting 06/07/2010   Qualifier: Diagnosis of  By: [name], [name]    . Non-ischemic cardiomyopathy ([place])    a. s/p MDT  CRTD  . Obesity, unspecified   . Other and unspecified hyperlipidemia   . Oxygen deficiency 2014   nocturnal;  . Papilloma of breast 03/06/2012   This was diagnosed as a left breast papilloma by ultrasound characteristics and symptoms in 2010. Because of possible medical risk factors no surgical intervention has been done and it did doing annual followups. The area has been stable by physical examination and mammograms and ultrasound since 2010.   Marland Kitchen Spinal stenosis of lumbar region 02/19/2013  . Spondylolisthesis   . Spondylosis   . Thyroid cancer ([place])    remote thyroidectomy, no recurrence, pt denies in 2016 thyroid cancer  . Type 2 diabetes mellitus with hyperglycemia ([place]) 07/15/2010  . Unspecified hypothyroidism       . 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 Vertigo         Past Surgical History:  Procedure Laterality Date  . AORTIC AND MITRAL VALVE REPLACEMENT  2001  . BI-VENTRICULAR IMPLANTABLE CARDIOVERTER DEFIBRILLATOR UPGRADE N/A 01/18/2015   Procedure: BI-VENTRICULAR IMPLANTABLE CARDIOVERTER DEFIBRILLATOR UPGRADE;  Surgeon: Evans Lance, MD;  Location: [facility] CATH LAB;  Service: Cardiovascular;  Laterality: N/A;  . BIV ICD GENERATOR CHANGEOUT N/A 01/30/2019   Procedure: BIV ICD GENERATOR CHANGEOUT;  Surgeon: Evans Lance, MD;  Location: [place] CV LAB;  Service: Cardiovascular;  Laterality: N/A;  . CARDIAC CATHETERIZATION    . CARDIAC VALVE REPLACEMENT    . CARDIOVERSION N/A 11/13/2016   Procedure: CARDIOVERSION;  Surgeon: Sanda Klein, MD;  Location: [facility] ENDOSCOPY;  Service: Cardiovascular;  Laterality: N/A;  . COLONOSCOPY  06/12/2007   Rourk- Normal rectum/Normal colon  . COLONOSCOPY  12/16/02   small hemorrhoids  . COLONOSCOPY  06/20/2012   Procedure: COLONOSCOPY;  Surgeon: Daneil Dolin, MD;  Location: [facility] ENDO SUITE;  Service: Endoscopy;  Laterality: N/A;  10:45  . defibrillator implanted 2006    . DOPPLER ECHOCARDIOGRAPHY  2012  .  DOPPLER ECHOCARDIOGRAPHY  05,06,07,08,09,2011  . ESOPHAGOGASTRODUODENOSCOPY  06/12/2007   Rourk- normal esophagus, small hiatal hernia, otherwise normal stomach, D1, D2  . EYE SURGERY Right 2005  . EYE SURGERY Left 2006  . ICD LEAD REMOVAL Left 02/08/2015   Procedure: ICD LEAD REMOVAL;  Surgeon: Evans Lance, MD;  Location[facility] OR;  Service: Cardiovascular;  Laterality: Left;  "Will plan extraction and insertion of a BiV PM"  **Dr. Roxan Hockey backing up case**  . IMPLANTABLE CARDIOVERTER DEFIBRILLATOR (ICD) GENERATOR CHANGE Left 02/08/2015   Procedure: ICD GENERATOR CHANGE;  Surgeon: Evans Lance, MD;  Location: [place];  Service: Cardiovascular;  Laterality: Left;  . INSERT / REPLACE / REMOVE PACEMAKER    . PACEMAKER INSERTION  May 2016  . pacemaker placed  2004  . right breast cyst removed     benign   .  right cataract removed     2005  . TEE WITHOUT CARDIOVERSION N/A 11/13/2016   Procedure: TRANSESOPHAGEAL ECHOCARDIOGRAM (TEE);  Surgeon: Sanda Klein, MD;  Location: [facility] ENDOSCOPY;  Service: Cardiovascular;  Laterality: N/A;  . THYROIDECTOMY       Home Medications:  Prior to Admission medications   Medication Sig Start Date End Date Taking? Authorizing Provider  acetaminophen (TYLENOL) 500 MG tablet Take 500 mg by mouth every 6 (six) hours as needed (pain).   Yes [provider]  albuterol (PROVENTIL) (2.5 MG/3ML) 0.083% nebulizer solution 90INHALE 1 VIAL VIA NEBULIZER THREE TIMES DAILY. Patient taking differently: Take 2.5 mg by nebulization in the morning and at bedtime. *May use additional one time if needed for shortness of breath 01/12/20  Yes Fayrene Helper, MD  albuterol (VENTOLIN HFA) 108 (90 Base) MCG/ACT inhaler INHALE 2 PUFFS INTO THE LUNGS EVERY 6 HOURS AS NEEDED FOR WHEEZING OR SHORTNESS OF BREATH Patient taking differently: Inhale 1-2 puffs into the lungs every 6 (six) hours as needed for wheezing or shortness of breath.  08/25/19  Yes Fayrene Helper,  MD  allopurinol (ZYLOPRIM) 100 MG tablet TAKE 1 TABLET(100 MG) BY MOUTH DAILY Patient taking differently: Take 100 mg by mouth in the morning.  12/30/19  Yes Fayrene Helper, MD  BREO ELLIPTA 100-25 MCG/INH AEPB 1 TAKE 1 INHALATION BY MOUTH ONCE DAILY Patient taking differently: Inhale 1 puff into the lungs in the morning.  08/25/19  Yes Fayrene Helper, MD  cholecalciferol (VITAMIN D3) 25 MCG (1000 UT) tablet Take 1,000 Units by mouth in the morning.    Yes [provider]  docusate sodium (COLACE) 100 MG capsule Take 300 mg by mouth at bedtime.    Yes [provider]  fluticasone (FLONASE) 50 MCG/ACT nasal spray SHAKE LIQUID AND USE 2 SPRAYS IN EACH NOSTRIL DAILY Patient taking differently: Place 2 sprays into both nostrils every morning.  11/24/19  Yes Fayrene Helper, MD  folic acid (FOLVITE) 1 MG tablet TAKE 1 TABLET(1 MG) BY MOUTH DAILY Patient taking differently: Take 1 mg by mouth every evening.  11/10/19  Yes Fayrene Helper, MD  furosemide (LASIX) 40 MG tablet Take 1 tablet (40 mg total) by mouth daily. Patient taking differently: Take 40 mg by mouth every morning.  08/19/19  Yes Fayrene Helper, MD  gabapentin (NEURONTIN) 300 MG capsule Take 1 capsule (300 mg total) by mouth 2 (two) times daily. 03/10/20  Yes Fayrene Helper, MD  hydrALAZINE (APRESOLINE) 25 MG tablet Take 1 tablet (25 mg total) by mouth 2 (two) times daily. 05/19/19  Yes Croitoru, Mihai, MD  isosorbide mononitrate (IMDUR) 30 MG 24 hr tablet Take 0.5 tablets (15 mg total) by mouth every morning. 02/04/20  Yes Pokhrel, Laxman, MD  Liniments (SALONPAS PAIN RELIEF PATCH EX) Apply 1 patch topically daily as needed (pain).   Yes [provider]  meclizine (ANTIVERT) 12.5 MG tablet TAKE 1 TABLET(12.5 MG) BY MOUTH THREE TIMES DAILY AS NEEDED FOR DIZZINESS Patient taking differently: Take 12.5 mg by mouth 3 (three) times daily as needed for dizziness or nausea.  03/01/20  Yes Fayrene Helper, MD  metolazone (ZAROXOLYN) 2.5 MG tablet Take one tablet on Saturdays. Patient taking differently: Take 2.5 mg by mouth every Saturday.  11/24/19  Yes Croitoru, Mihai, MD  metoprolol succinate (TOPROL-XL) 100 MG 24 hr tablet TAKE 1 TABLET BY MOUTH EVERY DAY WITH FOOD OR MILK Patient taking differently: Take 100 mg by mouth  every evening.  11/17/19  Yes Fayrene Helper, MD  nitroGLYCERIN (NITROSTAT) 0.4 MG SL tablet Place 1 tablet (0.4 mg total) under the tongue every 5 (five) minutes as needed for chest pain. 08/03/19  Yes Rolland Porter, MD  ondansetron (ZOFRAN ODT) 4 MG disintegrating tablet Take 1 tablet (4 mg total) by mouth every 8 (eight) hours as needed for nausea or vomiting. 01/26/20  Yes Fawze, Mina A, PA-C  Polyethyl [name] (SYSTANE) 0.4-0.3 % GEL ophthalmic gel Place 1 application into the left eye at bedtime.   Yes [provider]  potassium chloride SA (KLOR-CON) 20 MEQ tablet TAKE 1 TABLET(20 MEQ) BY MOUTH DAILY Patient taking differently: Take 20 mEq by mouth daily.  03/16/20  Yes Fayrene Helper, MD  pravastatin (PRAVACHOL) 40 MG tablet TAKE 1 TABLET BY MOUTH EVERY EVENING Patient taking differently: Take 40 mg by mouth every evening.  11/10/19  Yes Fayrene Helper, MD  SYNTHROID 75 MCG tablet TAKE 1 TABLET BY MOUTH DAILY Patient taking differently: Take 75 mcg by mouth daily before breakfast.  10/27/19  Yes Fayrene Helper, MD  warfarin (JANTOVEN) 5 MG tablet TAKE 2 TABLETS EVERY DAY EXCEPT 1 TABLET ON SATURDAY Patient taking differently: Take 5-10 mg by mouth See admin instructions. TAKE 2 TABLETS EVERY DAY EXCEPT 1 TABLET ON TUESDAYS @ 8:30PM 11/10/19  Yes Evans Lance, MD  polyethylene glycol powder (GLYCOLAX/MIRALAX) 17 GM/SCOOP powder Take 17 g by mouth daily as needed for mild constipation or moderate constipation.    [provider]    Inpatient Medications: Scheduled Meds: . allopurinol  100 mg Oral Daily  . fluticasone   2 spray Each Nare q morning - 10a  . fluticasone furoate-vilanterol  1 puff Inhalation Daily  . furosemide  40 mg Intravenous BID  . gabapentin  300 mg Oral BID  . hydrALAZINE  25 mg Oral BID  . isosorbide mononitrate  15 mg Oral q morning - 10a  . levothyroxine  75 mcg Oral Q0600  . metoprolol succinate  100 mg Oral QPM  . warfarin  12.5 mg Oral ONCE-1600  . Warfarin - Pharmacist Dosing Inpatient   Does not apply q1600   Continuous Infusions:  PRN Meds: acetaminophen **OR** acetaminophen, albuterol, hydrALAZINE, ondansetron **OR** ondansetron (ZOFRAN) IV, polyethylene glycol  Allergies:    Allergies  Allergen Reactions  . Penicillins Other (See Comments)    Bruise    . Aspirin Other (See Comments)    On coumadin  . Fentanyl Dermatitis    Rash with patch   . Niacin Itching    Social History:   Social History   Socioeconomic History  . Marital status: Widowed    Spouse name: Not on file  . Number of children: 0  . Years of education: Not on file  . Highest education level: Not on file  Occupational History  . Occupation: retired    Fish farm manager: RETIRED  Tobacco Use  . Smoking status: Former Smoker    Packs/day: 0.75    Years: 40.00    Pack years: 30.00    Types: Cigarettes    Quit date: 03/08/1987    Years since quitting: 33.0  . Smokeless tobacco: Never Used  Vaping Use  . Vaping Use: Never used  Substance and Sexual Activity  . Alcohol use: No    Alcohol/week: 0.0 standard drinks  . Drug use: No  . Sexual activity: Not Currently  Other Topics Concern  . Not on file  Social History  Narrative   From [facility] ([facility])   Social Determinants of Health   Financial Resource Strain:   . Difficulty of Paying Living Expenses:   Food Insecurity:   . Worried About Charity fundraiser in the Last Year:   . Arboriculturist in the Last Year:   Transportation Needs:   . Film/video editor (Medical):   Marland Kitchen Lack of Transportation (Non-Medical):   Physical  Activity:   . Days of Exercise per Week:   . Minutes of Exercise per Session:   Stress:   . Feeling of Stress :   Social Connections:   . Frequency of Communication with Friends and Family:   . Frequency of Social Gatherings with Friends and Family:   . Attends Religious Services:   . Active Member of Clubs or Organizations:   . Attends Archivist Meetings:   Marland Kitchen Marital Status:   Intimate Partner Violence:   . Fear of Current or Ex-Partner:   . Emotionally Abused:   Marland Kitchen Physically Abused:   . Sexually Abused:      Family History:    Family History  Problem Relation Age of Onset  . Pancreatic cancer Mother   . Heart disease Father   . Heart disease Sister   . Heart attack Sister   . Heart disease Brother   . Diabetes Brother   . Heart disease Brother   . Diabetes Brother   . Hypertension Brother   . Lung cancer Brother       Review of Systems    General:  No chills, fever, night sweats or weight changes.  Cardiovascular:  No chest pain, orthopnea, palpitations, paroxysmal nocturnal dyspnea. Positive for dyspnea on exertion and edema.  Dermatological: No rash, lesions/masses Respiratory: Positive for productive cough and dyspnea. Urologic: No hematuria, dysuria Abdominal:   No nausea, vomiting, diarrhea, bright red blood per rectum, melena, or hematemesis Neurologic:  No visual changes, wkns, changes in mental status. All other systems reviewed and are otherwise negative except as noted above.  Physical Exam/Data    Vitals:   03/31/20 0022 03/31/20 0401 03/31/20 0430 03/31/20 0829  BP:  135/66  (!) 161/78  Pulse: 70 (!) 59  (!) 58  Resp:  16  16  Temp:      TempSrc:      SpO2:  100%  95%  Weight:   88.4 kg   Height:        Intake/Output Summary (Last 24 hours) at 03/31/2020 1022 Last data filed at 03/31/2020 0900 Gross per 24 hour  Intake 390 ml  Output 3150 ml  Net -2760 ml   Filed Weights   03/30/20 1132 03/31/20 0430  Weight: 89.4 kg 88.4 kg     Body mass index is 30.52 kg/m.   General: Pleasant, elderly female appearing in NAD Psych: Normal affect. Neuro: Alert and oriented X 3. Moves all extremities spontaneously. HEENT: Normal  Neck: Supple without bruits. JVD elevated. Lungs:  Resp regular and unlabored, CTA without wheezing or rales. Heart: Irregularly irregular. Crisp mechanical valve sounds appreciated.  Abdomen: Soft, non-tender, non-distended, BS + x 4.  Extremities: No clubbing or cyanosis. 2+ pitting edema, more prominent along LLE. DP/PT/Radials 2+ and equal bilaterally.   EKG:  The EKG was personally reviewed and demonstrates:  V-paced rhythm, HR 60 with underlying atrial flutter.   Telemetry:  Telemetry was personally reviewed and demonstrates: V-paced rhythm, HR in 60's with PVC's and underlying flutter.    Labs/Studies  Relevant CV Studies:  Echocardiogram: 01/2020 IMPRESSIONS    1. Left ventricular ejection fraction, by estimation, is 30 to 35%. The  left ventricle has moderately decreased function. The left ventricle  demonstrates global hypokinesis. There is mild concentric left ventricular  hypertrophy. Left ventricular  diastolic function could not be evaluated.  2. Right ventricular systolic function is normal. The right ventricular  size is normal. There is severely elevated pulmonary artery systolic  pressure. The estimated right ventricular systolic pressure is 44.0 mmHg.  3. Left atrial size was severely dilated.  4. Right atrial size was moderately dilated.  5. The mitral valve has been repaired/replaced. No evidence of mitral  valve regurgitation.  6. The aortic valve has been repaired/replaced. Aortic valve  regurgitation is not visualized.   Laboratory Data:  Chemistry Recent Labs  Lab 03/30/20 1202 03/31/20 0442  NA 130* 131*  K 5.4* 5.0  CL 90* 91*  CO2 31 32  GLUCOSE 97 97  BUN 44* 40*  CREATININE 1.50* 1.40*  CALCIUM 9.4 8.9  GFRNONAA 31* 34*  GFRAA 36*  39*  ANIONGAP 9 8    Recent Labs  Lab 03/30/20 1202  PROT 7.5  ALBUMIN 3.8  AST 28  ALT 20  ALKPHOS 51  BILITOT 0.4   Hematology Recent Labs  Lab 03/30/20 1202  WBC 6.3  RBC 3.44*  HGB 10.1*  HCT 32.8*  MCV 95.3  MCH 29.4  MCHC 30.8  RDW 17.2*  PLT 208   Cardiac EnzymesNo results for input(s): TROPONINI in the last 168 hours. No results for input(s): TROPIPOC in the last 168 hours.  BNP Recent Labs  Lab 03/30/20 1202  BNP 502.0*    DDimer No results for input(s): DDIMER in the last 168 hours.  Radiology/Studies:  DG Chest Portable 1 View  Result Date: 03/30/2020 CLINICAL DATA:  Shortness of breath and CHF EXAM: PORTABLE CHEST 1 VIEW COMPARISON:  Jan 30, 2020 FINDINGS: Again noted is stable cardiomegaly. Aortic knob calcifications are seen. Overlying median sternotomy wires and prosthetic valves are noted. A left-sided pacemaker noted with the lead tips in the right atrium and right ventricle. Mildly increased interstitial markings are again noted throughout both lungs, likely chronic lung changes. No new airspace consolidation or pleural effusion. Advanced left shoulder osteoarthritis is seen with cystic/erosive type change at the humeral head. IMPRESSION: No active disease.  Unchanged cardiomegaly Electronically Signed   By: Prudencio Pair M.D.   On: 03/30/2020 14:48     Assessment & Plan    1. Acute on Chronic Combined Systolic and Diastolic CHF - EF was previously as low as 20-25%, normalized by repeat echo in 2019 and 02/2019, EF 30-35% by echocardiogram in 01/2020. - She presents with worsening dyspnea on exertion and lower extremity edema in the setting of a 10+ pound weight gain since hospital discharge in 01/2020 as weight at the time of discharge was 186 lbs and now at 197 lbs on admission.  - BNP elevated to 502. CXR shows cardiomegaly and chronic lung changes with no acute changes. Would continue with IV Lasix 40mg  BID as she is responding well with a recorded  net output of -2.7L thus far and creatinine has improved to 1.40 this AM. Will likely require a higher standing dose of Lasix at the time of discharge as she was on 40mg  daily.  - Continue Toprol-XL, Imdur and Hydralazine. She is not on an ACE-I/ARB/ARNI due to variable renal function.   2. CHB  - s/p Medtronic  BiV PPM in 01/2019. Followed by Dr. Lovena Le and her device was recently reprogrammed in 01/2020 and is it was felt this was not capturing appropriately at that time.  3. Persistent Atrial Fibrillation/Flutter - She denies any recent palpitations and remains on Toprol-XL 100 mg daily for rate control. She is on Coumadin for anticoagulation and dosing is being followed by Pharmacy.  4. History of Mechanical AVR in 2001 and Mechanical MVR in 2004 - Echocardiogram in 01/2020 showed no evidence of mitral regurgitation or aortic regurgitation. She remains on Coumadin for anticoagulation and dosing is being followed by Pharmacy.   5. Stage 3 CKD - Baseline creatinine 1.3 - 1.5. At 1.50 on admission, improved to 1.40 today.   For questions or updates, please contact [place] Please consult [email or web address] for contact info under Cardiology/STEMI.  Signed, Erma Heritage, PA-C 03/31/2020, 10:22 AM Pager: [phone]   Attending note:  Patient seen and examined.  I reviewed her records and discussed the case with Ms. Ahmed Prima PA-C.  I agree with her above findings.  Ms. Crafts presents with increasing shortness of breath, lower extremity edema, and also weight gain of at least 10 pounds as compared with May.  She reports compliance with her medications, no recent changes in diet or salt intake.  On examination she appears comfortable.  Heart rate is 60-70 with ventricular pacing, systolic blood pressure ranging 130s to 160s.  Lungs exhibit decreased breath sounds.  Cardiac exam with RRR and soft systolic murmur, no gallop.  She has leg edema, left greater than right.  Pertinent lab  work includes potassium 5.0, sodium 131, creatinine 1.4, hemoglobin 10.1, BNP 502, INR 1.8.  Personally reviewed her ECG which shows a ventricular paced rhythm with underlying atrial fibrillation/flutter.  Chest x-ray shows stable cardiomegaly with no active process, chronic interstitial markings.  Acute on chronic systolic heart failure, LVEF 30 to 35% by most recent echocardiogram in May.  Patient has at least a 10 pound weight gain, responding to IV Lasix which we will continue at this point. She was taking Lasix 40 mg once daily at home, this will likely need to be increased at discharge.  Continue Toprol-XL, hydralazine, and Imdur.  She otherwise continues on Coumadin per pharmacy with history of mechanical AVR and MVR.  Satira Sark, M.D., F.A.C.C.

## 2020-03-31 NOTE — Progress Notes (Signed)
   03/31/20 2327  Provider Notification  Provider Name/Title  Angelina Sheriff  Date Provider Notified 03/31/20  Time Provider Notified 2327  Notification Type Page  Notification Reason Other (Comment) ( 5 beats of vtach on 2313, 6 beats on 2315)  Response Other (Comment) (waiting )

## 2020-03-31 NOTE — Progress Notes (Signed)
PROGRESS NOTE   [name] [name]  JIR:[id number] DOB: 1931-12-13 DOA: 03/30/2020 PCP: Fayrene Helper, MD   Chief Complaint  Patient presents with  . Leg Swelling    Brief Admission History:  86 y.o. female with medical history significant for COPD, diastolic CHF, on chronic 2 L of oxygen, complete heart block, AICD/pacemaker status, aortic and mitral valve replacement on chronic anticoagulation, diabetes mellitus.  Patient presented to the ED with complaints of bilateral lower extremity swelling over the past 3 to 4 days, she also reports difficulty breathing worse with exertion improved with rest.    Assessment & Plan:   Principal Problem:   Decompensated heart failure (HCC) Active Problems:   Essential hypertension   Hx of aortic valve replacement, mechanical   S/P MVR (mitral valve replacement)   Biventricular automatic implantable cardioverter defibrillator in situ   CKD (chronic kidney disease), stage IV (HCC)   Chronic anticoagulation, on coumadin for Mechanical AVR and MVR   COPD (chronic obstructive pulmonary disease) (HCC)   Persistent atrial fibrillation (HCC)   Chronic systolic congestive heart failure (HCC)   Acute systolic CHF (congestive heart failure) ([place])  1. Acute on chronic combined systolic and diastolic CHF - Pt is diuresing well on IV lasix and weight is slowly trending down. She remains over dry weight.  Renal function is improving. Continue  IV lasix.  2. Hyponatremia - slightly improved with diuresis but remains volume overloaded.  3. Chronic hypoxic respiratory failure - stable on chronic supplemental oxygen 2L/min. 4. Essential hypertension - slowly improved with diuresis, resumed home meds.  5. Complete heart block s/p PPM - stable.  Resume home metoprolol.  6. Stage 3b CKD - creatinine improved with IV lasix and diuresis.  7. Mechanical mitral and aortic valve - warfarin subtherapeutic, follow closely, pharmacy dosing warfarin in hospital.   8. Persistent atrial fibrillation - rate controlled and anticoagulated.  9. Anemia of chronic disease - follow HG.  10. Hypothyroidism - resume home levothyroxine dose.  11. Type 2 diabetes mellitus with vascular complications - follow CBG and give SSI coverage.   DVT prophylaxis: warfarin  Code Status: DNR  Family Communication:  No answer to telephone attempt Disposition: Home with [place]   Status is: Inpatient  Remains inpatient appropriate because:IV treatments appropriate due to intensity of illness or inability to take PO   Dispo: The patient is from: Home              Anticipated d/c is to: Home              Anticipated d/c date is: 2 days              Patient currently is not medically stable to d/c.  Consultants:     Procedures:     Antimicrobials:  N/a  Subjective: Pt reports feeling SOB.  NO chest pain. No cough.   Objective: Vitals:   03/31/20 0401 03/31/20 0430 03/31/20 0829 03/31/20 1333  BP: 135/66  (!) 161/78 (!) 159/76  Pulse: (!) 59  (!) 58 61  Resp: 16  16 18   Temp:    98.1 F (36.7 C)  TempSrc:    Oral  SpO2: 100%  95% 96%  Weight:  88.4 kg    Height:        Intake/Output Summary (Last 24 hours) at 03/31/2020 1720 Last data filed at 03/31/2020 1600 Gross per 24 hour  Intake 390 ml  Output 2800 ml  Net -2410 ml   [employer]  03/30/20 1132 03/31/20 0430  Weight: 89.4 kg 88.4 kg    Examination:  General exam: Appears calm and comfortable  Respiratory system: Clear to auscultation. Respiratory effort normal. Cardiovascular system: S1 & S2 heard, RRR. No JVD, murmurs, rubs, gallops or clicks. No pedal edema. Gastrointestinal system: Abdomen is nondistended, soft and nontender. No organomegaly or masses felt. Normal bowel sounds heard. Central nervous system: Alert and oriented. No focal neurological deficits. Extremities: 1+ edema BLEs, Symmetric 5 x 5 power. Skin: No rashes, lesions or ulcers Psychiatry: Judgement and insight appear  normal. Mood & affect appropriate.   Data Reviewed: I have personally reviewed following labs and imaging studies  CBC: Recent Labs  Lab 03/30/20 1202  WBC 6.3  HGB 10.1*  HCT 32.8*  MCV 95.3  PLT 833    Basic Metabolic Panel: Recent Labs  Lab 03/30/20 1202 03/31/20 0442  NA 130* 131*  K 5.4* 5.0  CL 90* 91*  CO2 31 32  GLUCOSE 97 97  BUN 44* 40*  CREATININE 1.50* 1.40*  CALCIUM 9.4 8.9    GFR: Estimated Creatinine Clearance: 32.9 mL/min (A) (by C-G formula based on SCr of 1.4 mg/dL (H)).  Liver Function Tests: Recent Labs  Lab 03/30/20 1202  AST 28  ALT 20  ALKPHOS 51  BILITOT 0.4  PROT 7.5  ALBUMIN 3.8    CBG: Recent Labs  Lab 03/30/20 2115 03/31/20 1105 03/31/20 1623  GLUCAP 95 100* 122*    Recent Results (from the past 240 hour(s))  SARS Coronavirus 2 by RT PCR (hospital order, performed in [facility] hospital lab) Nasopharyngeal Nasopharyngeal Swab     Status: None   Collection Time: 03/30/20  7:06 PM   Specimen: Nasopharyngeal Swab  Result Value Ref Range Status   SARS Coronavirus 2 NEGATIVE NEGATIVE Final    Comment: (NOTE) SARS-CoV-2 target nucleic acids are NOT DETECTED.  The SARS-CoV-2 RNA is generally detectable in upper and lower respiratory specimens during the acute phase of infection. The lowest concentration of SARS-CoV-2 viral copies this assay can detect is 250 copies / mL. A negative result does not preclude SARS-CoV-2 infection and should not be used as the sole basis for treatment or other patient management decisions.  A negative result may occur with improper specimen collection / handling, submission of specimen other than nasopharyngeal swab, presence of viral mutation(s) within the areas targeted by this assay, and inadequate number of viral copies (<250 copies / mL). A negative result must be combined with clinical observations, patient history, and epidemiological information.  Fact Sheet for Patients:     StrictlyIdeas.no  Fact Sheet for Healthcare Providers: BankingDealers.co.za  This test is not yet approved or  cleared by the [place] FDA and has been authorized for detection and/or diagnosis of SARS-CoV-2 by FDA under an Emergency Use Authorization (EUA).  This EUA will remain in effect (meaning this test can be used) for the duration of the COVID-19 declaration under Section 564(b)(1) of the Act, 21 U.S.C. section 360bbb-3(b)(1), unless the authorization is terminated or revoked sooner.  Performed at [facility], [address], [place], [place] [address]      Radiology Studies: DG Chest Portable 1 View  Result Date: 03/30/2020 CLINICAL DATA:  Shortness of breath and CHF EXAM: PORTABLE CHEST 1 VIEW COMPARISON:  Jan 30, 2020 FINDINGS: Again noted is stable cardiomegaly. Aortic knob calcifications are seen. Overlying median sternotomy wires and prosthetic valves are noted. A left-sided pacemaker noted with the lead tips in the right  atrium and right ventricle. Mildly increased interstitial markings are again noted throughout both lungs, likely chronic lung changes. No new airspace consolidation or pleural effusion. Advanced left shoulder osteoarthritis is seen with cystic/erosive type change at the humeral head. IMPRESSION: No active disease.  Unchanged cardiomegaly Electronically Signed   By: Prudencio Pair M.D.   On: 03/30/2020 14:48   Scheduled Meds: . allopurinol  100 mg Oral Daily  . fluticasone  2 spray Each Nare q morning - 10a  . fluticasone furoate-vilanterol  1 puff Inhalation Daily  . furosemide  40 mg Intravenous BID  . gabapentin  300 mg Oral BID  . hydrALAZINE  25 mg Oral BID  . isosorbide mononitrate  15 mg Oral q morning - 10a  . levothyroxine  75 mcg Oral Q0600  . metoprolol succinate  100 mg Oral QPM  . warfarin  12.5 mg Oral ONCE-1600  . Warfarin - Pharmacist Dosing Inpatient   Does not apply q1600   Continuous  Infusions:   LOS: 0 days   Time spent: 35 mins   Sheletha Bow Wynetta Emery, MD How to contact the [facility] Attending or Consulting provider [place] or covering provider during after hours [place], for this patient?  1. Check the care team in [facility] and look for a) attending/consulting TR[facility] provider listed and b) the [facility] team listed 2. Log into [email or web address] and use 's universal password to access. If you do not have the password, please contact the hospital operator. 3. Locate the [facility] provider you are looking for under [facility] and page to a number that you can be directly reached. 4. If you still have difficulty reaching the provider, please page the [facility] (Director on Call) for the Hospitalists listed on amion for assistance.  03/31/2020, 5:20 PM

## 2020-03-31 NOTE — Progress Notes (Signed)
ANTICOAGULATION CONSULT NOTE - Initial Consult  Pharmacy Consult for Coumadin Indication: Afib and mechanical valves  Allergies  Allergen Reactions  . Penicillins Other (See Comments)    Bruise    . Aspirin Other (See Comments)    On coumadin  . Fentanyl Dermatitis    Rash with patch   . Niacin Itching    Patient Measurements: Height: 5\' 7"  (170.2 cm) Weight: 89.4 kg (197 lb) IBW/kg (Calculated) : 61.6 Vital Signs: Temp: 97.9 F (36.6 C) (07/06 2347) Temp Source: Oral (07/06 2347) BP: 180/72 (07/06 2347) Pulse Rate: 70 (07/07 0022)  Labs: Recent Labs    03/30/20 1202  HGB 10.1*  HCT 32.8*  PLT 208  CREATININE 1.50*    Estimated Creatinine Clearance: 30.9 mL/min (A) (by C-G formula based on SCr of 1.5 mg/dL (H)).   Medical History: Past Medical History:  Diagnosis Date  . Acute on chronic diastolic CHF (congestive heart failure) ([place]) 05/19/2012  . Acute on chronic respiratory failure with hypoxia ([place]) 01/31/2020  . Adenomatous polyp of colon 12/16/2002   Dr. [name]/[facility]  . Allergy   . Calculus of gallbladder with chronic cholecystitis without obstruction 02/09/2009   Qualifier: Diagnosis of  By: [name], [name]    . Cataract   . CHB (complete heart block) ([place]) 10/07/2014      . CHF (congestive heart failure) ([place])    a.  EF previously reduced to 25-30% b. EF improved to 60-65% by echo in 10/2017.   Marland Kitchen Chronic kidney disease, stage I 2006  . Constipation       . COPD (chronic obstructive pulmonary disease) ([place])       . DJD (degenerative joint disease) of lumbar spine   . DM (diabetes mellitus) ([place]) 2006  . Esophageal reflux   . Glaucoma   . Gout       . H/O adenomatous polyp of colon 05/24/2012   2004 in [place] colonoscopy 2008 normal    . H/O heart valve replacement with mechanical valve    a. s/p mechanical AVR in 2001 and mechanical MVR in 2004.  Marland Kitchen H/O wisdom tooth extraction   . HIP PAIN, RIGHT  04/23/2009   Qualifier: Diagnosis of  By: [name], [name]    . Hyperlipemia   . IDA (iron deficiency anemia)    Parenteral iron/[name]  . Insomnia       . Nausea without vomiting 06/07/2010   Qualifier: Diagnosis of  By: [name], [name]    . Non-ischemic cardiomyopathy ([place])    a. s/p MDT CRTD  . Obesity, unspecified   . Other and unspecified hyperlipidemia   . Oxygen deficiency 2014   nocturnal;  . Papilloma of breast 03/06/2012   This was diagnosed as a left breast papilloma by ultrasound characteristics and symptoms in 2010. Because of possible medical risk factors no surgical intervention has been done and it did doing annual followups. The area has been stable by physical examination and mammograms and ultrasound since 2010.   Marland Kitchen Spinal stenosis of lumbar region 02/19/2013  . Spondylolisthesis   . Spondylosis   . Thyroid cancer ([place])    remote thyroidectomy, no recurrence, pt denies in 2016 thyroid cancer  . Type 2 diabetes mellitus with hyperglycemia ([place]) 07/15/2010  . Unspecified hypothyroidism       . 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 Vertigo         Medications:  Medications  Prior to Admission  Medication Sig Dispense Refill Last Dose  . acetaminophen (TYLENOL) 500 MG tablet Take 500 mg by mouth every 6 (six) hours as needed (pain).   UNKNOWN  . albuterol (PROVENTIL) (2.5 MG/3ML) 0.083% nebulizer solution 90INHALE 1 VIAL VIA NEBULIZER THREE TIMES DAILY. (Patient taking differently: Take 2.5 mg by nebulization in the morning and at bedtime. *May use additional one time if needed for shortness of breath) 300 mL 3 UNKNOWN  . albuterol (VENTOLIN HFA) 108 (90 Base) MCG/ACT inhaler INHALE 2 PUFFS INTO THE LUNGS EVERY 6 HOURS AS NEEDED FOR WHEEZING OR SHORTNESS OF BREATH (Patient taking differently: Inhale 1-2 puffs into the lungs every 6 (six) hours as needed for wheezing or shortness of breath. ) 54 g 2 Past Week at Unknown time  .  allopurinol (ZYLOPRIM) 100 MG tablet TAKE 1 TABLET(100 MG) BY MOUTH DAILY (Patient taking differently: Take 100 mg by mouth in the morning. ) 90 tablet 1 03/29/2020 at Unknown time  . BREO ELLIPTA 100-25 MCG/INH AEPB 1 TAKE 1 INHALATION BY MOUTH ONCE DAILY (Patient taking differently: Inhale 1 puff into the lungs in the morning. ) 1 each 4 Past Week at Unknown time  . cholecalciferol (VITAMIN D3) 25 MCG (1000 UT) tablet Take 1,000 Units by mouth in the morning.    03/29/2020 at Unknown time  . docusate sodium (COLACE) 100 MG capsule Take 300 mg by mouth at bedtime.    03/29/2020 at Unknown time  . fluticasone (FLONASE) 50 MCG/ACT nasal spray SHAKE LIQUID AND USE 2 SPRAYS IN EACH NOSTRIL DAILY (Patient taking differently: Place 2 sprays into both nostrils every morning. ) 48 g 1 03/29/2020 at Unknown time  . folic acid (FOLVITE) 1 MG tablet TAKE 1 TABLET(1 MG) BY MOUTH DAILY (Patient taking differently: Take 1 mg by mouth every evening. ) 100 tablet 5 03/29/2020 at Unknown time  . furosemide (LASIX) 40 MG tablet Take 1 tablet (40 mg total) by mouth daily. (Patient taking differently: Take 40 mg by mouth every morning. ) 90 tablet 1 03/29/2020 at Unknown time  . gabapentin (NEURONTIN) 300 MG capsule Take 1 capsule (300 mg total) by mouth 2 (two) times daily. 60 capsule 3 03/29/2020 at Unknown time  . hydrALAZINE (APRESOLINE) 25 MG tablet Take 1 tablet (25 mg total) by mouth 2 (two) times daily. 180 tablet 3 03/29/2020 at Unknown time  . isosorbide mononitrate (IMDUR) 30 MG 24 hr tablet Take 0.5 tablets (15 mg total) by mouth every morning. 30 tablet 2 03/29/2020 at Unknown time  . Liniments (SALONPAS PAIN RELIEF PATCH EX) Apply 1 patch topically daily as needed (pain).   Past Month at Unknown time  . meclizine (ANTIVERT) 12.5 MG tablet TAKE 1 TABLET(12.5 MG) BY MOUTH THREE TIMES DAILY AS NEEDED FOR DIZZINESS (Patient taking differently: Take 12.5 mg by mouth 3 (three) times daily as needed for dizziness or nausea. ) 30  tablet 2 03/29/2020 at Unknown time  . metolazone (ZAROXOLYN) 2.5 MG tablet Take one tablet on Saturdays. (Patient taking differently: Take 2.5 mg by mouth every Saturday. ) 12 tablet 5 03/27/2020 at Unknown time  . metoprolol succinate (TOPROL-XL) 100 MG 24 hr tablet TAKE 1 TABLET BY MOUTH EVERY DAY WITH FOOD OR MILK (Patient taking differently: Take 100 mg by mouth every evening. ) 90 tablet 1 03/29/2020 at 1700  . nitroGLYCERIN (NITROSTAT) 0.4 MG SL tablet Place 1 tablet (0.4 mg total) under the tongue every 5 (five) minutes as needed for chest pain. [place]  tablet 0 unknown  . ondansetron (ZOFRAN ODT) 4 MG disintegrating tablet Take 1 tablet (4 mg total) by mouth every 8 (eight) hours as needed for nausea or vomiting. 10 tablet 0 Past Month at Unknown time  . Polyethyl [name] (SYSTANE) 0.4-0.3 % GEL ophthalmic gel Place 1 application into the left eye at bedtime.   03/29/2020 at Unknown time  . potassium chloride SA (KLOR-CON) 20 MEQ tablet TAKE 1 TABLET(20 MEQ) BY MOUTH DAILY (Patient taking differently: Take 20 mEq by mouth daily. ) 90 tablet 1 03/29/2020 at Unknown time  . pravastatin (PRAVACHOL) 40 MG tablet TAKE 1 TABLET BY MOUTH EVERY EVENING (Patient taking differently: Take 40 mg by mouth every evening. ) 90 tablet 3 03/29/2020 at Unknown time  . SYNTHROID 75 MCG tablet TAKE 1 TABLET BY MOUTH DAILY (Patient taking differently: Take 75 mcg by mouth daily before breakfast. ) 90 tablet 1 03/29/2020 at Unknown time  . warfarin (JANTOVEN) 5 MG tablet TAKE 2 TABLETS EVERY DAY EXCEPT 1 TABLET ON SATURDAY (Patient taking differently: Take 5-10 mg by mouth See admin instructions. TAKE 2 TABLETS EVERY DAY EXCEPT 1 TABLET ON TUESDAYS @ 8:30PM) 60 tablet 0 03/29/2020 at 2030  . polyethylene glycol powder (GLYCOLAX/MIRALAX) 17 GM/SCOOP powder Take 17 g by mouth daily as needed for mild constipation or moderate constipation.       Assessment: 86 y.o. female admitted with SOB/CHF, h/o Afib and mechanical  AVR/MVR, to continue Coumadin   Goal of Therapy:  INR 2-2.5 Monitor platelets by anticoagulation protocol: Yes   Plan:  F/U daily INR  Sargent Mankey, [name] 03/31/2020,1:05 AM

## 2020-03-31 NOTE — Progress Notes (Signed)
ANTICOAGULATION CONSULT NOTE - Initial Consult  Pharmacy Consult for warfarin Indication: h/o Afib and mechanical AVR/MVR  Allergies  Allergen Reactions  . Penicillins Other (See Comments)    Bruise    . Aspirin Other (See Comments)    On coumadin  . Fentanyl Dermatitis    Rash with patch   . Niacin Itching    Patient Measurements: Height: 5\' 7"  (170.2 cm) Weight: 88.4 kg (194 lb 14.2 oz) IBW/kg (Calculated) : 61.6   Vital Signs: Temp: 97.9 F (36.6 C) (07/06 2347) Temp Source: Oral (07/06 2347) BP: 135/66 (07/07 0401) Pulse Rate: 59 (07/07 0401)  Labs: Recent Labs    03/30/20 1202 03/31/20 0442  HGB 10.1*  --   HCT 32.8*  --   PLT 208  --   LABPROT  --  20.6*  INR  --  1.8*  CREATININE 1.50* 1.40*    Estimated Creatinine Clearance: 32.9 mL/min (A) (by C-G formula based on SCr of 1.4 mg/dL (H)).   Medical History: Past Medical History:  Diagnosis Date  . Acute on chronic diastolic CHF (congestive heart failure) ([place]) 05/19/2012  . Acute on chronic respiratory failure with hypoxia ([place]) 01/31/2020  . Adenomatous polyp of colon 12/16/2002   Dr. [name]/[facility]  . Allergy   . Calculus of gallbladder with chronic cholecystitis without obstruction 02/09/2009   Qualifier: Diagnosis of  By: [name], [name]    . Cataract   . CHB (complete heart block) ([place]) 10/07/2014      . CHF (congestive heart failure) ([place])    a.  EF previously reduced to 25-30% b. EF improved to 60-65% by echo in 10/2017.   Marland Kitchen Chronic kidney disease, stage I 2006  . Constipation       . COPD (chronic obstructive pulmonary disease) ([place])       . DJD (degenerative joint disease) of lumbar spine   . DM (diabetes mellitus) ([place]) 2006  . Esophageal reflux   . Glaucoma   . Gout       . H/O adenomatous polyp of colon 05/24/2012   2004 in [place] colonoscopy 2008 normal    . H/O heart valve replacement with mechanical valve    a. s/p mechanical  AVR in 2001 and mechanical MVR in 2004.  Marland Kitchen H/O wisdom tooth extraction   . HIP PAIN, RIGHT 04/23/2009   Qualifier: Diagnosis of  By: [name], [name]    . Hyperlipemia   . IDA (iron deficiency anemia)    Parenteral iron/[name]  . Insomnia       . Nausea without vomiting 06/07/2010   Qualifier: Diagnosis of  By: [name], [name]    . Non-ischemic cardiomyopathy ([place])    a. s/p MDT CRTD  . Obesity, unspecified   . Other and unspecified hyperlipidemia   . Oxygen deficiency 2014   nocturnal;  . Papilloma of breast 03/06/2012   This was diagnosed as a left breast papilloma by ultrasound characteristics and symptoms in 2010. Because of possible medical risk factors no surgical intervention has been done and it did doing annual followups. The area has been stable by physical examination and mammograms and ultrasound since 2010.   Marland Kitchen Spinal stenosis of lumbar region 02/19/2013  . Spondylolisthesis   . Spondylosis   . Thyroid cancer ([place])    remote thyroidectomy, no recurrence, pt denies in 2016 thyroid cancer  . Type 2 diabetes mellitus with hyperglycemia ([place]) 07/15/2010  . Unspecified hypothyroidism       .  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 Vertigo         Medications:  Medications Prior to Admission  Medication Sig Dispense Refill Last Dose  . acetaminophen (TYLENOL) 500 MG tablet Take 500 mg by mouth every 6 (six) hours as needed (pain).   UNKNOWN  . albuterol (PROVENTIL) (2.5 MG/3ML) 0.083% nebulizer solution 90INHALE 1 VIAL VIA NEBULIZER THREE TIMES DAILY. (Patient taking differently: Take 2.5 mg by nebulization in the morning and at bedtime. *May use additional one time if needed for shortness of breath) 300 mL 3 UNKNOWN  . albuterol (VENTOLIN HFA) 108 (90 Base) MCG/ACT inhaler INHALE 2 PUFFS INTO THE LUNGS EVERY 6 HOURS AS NEEDED FOR WHEEZING OR SHORTNESS OF BREATH (Patient taking differently: Inhale 1-2 puffs into the lungs every 6 (six)  hours as needed for wheezing or shortness of breath. ) 54 g 2 Past Week at Unknown time  . allopurinol (ZYLOPRIM) 100 MG tablet TAKE 1 TABLET(100 MG) BY MOUTH DAILY (Patient taking differently: Take 100 mg by mouth in the morning. ) 90 tablet 1 03/29/2020 at Unknown time  . BREO ELLIPTA 100-25 MCG/INH AEPB 1 TAKE 1 INHALATION BY MOUTH ONCE DAILY (Patient taking differently: Inhale 1 puff into the lungs in the morning. ) 1 each 4 Past Week at Unknown time  . cholecalciferol (VITAMIN D3) 25 MCG (1000 UT) tablet Take 1,000 Units by mouth in the morning.    03/29/2020 at Unknown time  . docusate sodium (COLACE) 100 MG capsule Take 300 mg by mouth at bedtime.    03/29/2020 at Unknown time  . fluticasone (FLONASE) 50 MCG/ACT nasal spray SHAKE LIQUID AND USE 2 SPRAYS IN EACH NOSTRIL DAILY (Patient taking differently: Place 2 sprays into both nostrils every morning. ) 48 g 1 03/29/2020 at Unknown time  . folic acid (FOLVITE) 1 MG tablet TAKE 1 TABLET(1 MG) BY MOUTH DAILY (Patient taking differently: Take 1 mg by mouth every evening. ) 100 tablet 5 03/29/2020 at Unknown time  . furosemide (LASIX) 40 MG tablet Take 1 tablet (40 mg total) by mouth daily. (Patient taking differently: Take 40 mg by mouth every morning. ) 90 tablet 1 03/29/2020 at Unknown time  . gabapentin (NEURONTIN) 300 MG capsule Take 1 capsule (300 mg total) by mouth 2 (two) times daily. 60 capsule 3 03/29/2020 at Unknown time  . hydrALAZINE (APRESOLINE) 25 MG tablet Take 1 tablet (25 mg total) by mouth 2 (two) times daily. 180 tablet 3 03/29/2020 at Unknown time  . isosorbide mononitrate (IMDUR) 30 MG 24 hr tablet Take 0.5 tablets (15 mg total) by mouth every morning. 30 tablet 2 03/29/2020 at Unknown time  . Liniments (SALONPAS PAIN RELIEF PATCH EX) Apply 1 patch topically daily as needed (pain).   Past Month at Unknown time  . meclizine (ANTIVERT) 12.5 MG tablet TAKE 1 TABLET(12.5 MG) BY MOUTH THREE TIMES DAILY AS NEEDED FOR DIZZINESS (Patient taking  differently: Take 12.5 mg by mouth 3 (three) times daily as needed for dizziness or nausea. ) 30 tablet 2 03/29/2020 at Unknown time  . metolazone (ZAROXOLYN) 2.5 MG tablet Take one tablet on Saturdays. (Patient taking differently: Take 2.5 mg by mouth every Saturday. ) 12 tablet 5 03/27/2020 at Unknown time  . metoprolol succinate (TOPROL-XL) 100 MG 24 hr tablet TAKE 1 TABLET BY MOUTH EVERY DAY WITH FOOD OR MILK (Patient taking differently: Take 100 mg by mouth every evening. ) 90 tablet 1 03/29/2020 at 1700  . nitroGLYCERIN (NITROSTAT)  0.4 MG SL tablet Place 1 tablet (0.4 mg total) under the tongue every 5 (five) minutes as needed for chest pain. 30 tablet 0 unknown  . ondansetron (ZOFRAN ODT) 4 MG disintegrating tablet Take 1 tablet (4 mg total) by mouth every 8 (eight) hours as needed for nausea or vomiting. 10 tablet 0 Past Month at Unknown time  . Polyethyl [name] (SYSTANE) 0.4-0.3 % GEL ophthalmic gel Place 1 application into the left eye at bedtime.   03/29/2020 at Unknown time  . potassium chloride SA (KLOR-CON) 20 MEQ tablet TAKE 1 TABLET(20 MEQ) BY MOUTH DAILY (Patient taking differently: Take 20 mEq by mouth daily. ) 90 tablet 1 03/29/2020 at Unknown time  . pravastatin (PRAVACHOL) 40 MG tablet TAKE 1 TABLET BY MOUTH EVERY EVENING (Patient taking differently: Take 40 mg by mouth every evening. ) 90 tablet 3 03/29/2020 at Unknown time  . SYNTHROID 75 MCG tablet TAKE 1 TABLET BY MOUTH DAILY (Patient taking differently: Take 75 mcg by mouth daily before breakfast. ) 90 tablet 1 03/29/2020 at Unknown time  . warfarin (JANTOVEN) 5 MG tablet TAKE 2 TABLETS EVERY DAY EXCEPT 1 TABLET ON SATURDAY (Patient taking differently: Take 5-10 mg by mouth See admin instructions. TAKE 2 TABLETS EVERY DAY EXCEPT 1 TABLET ON TUESDAYS @ 8:30PM) 60 tablet 0 03/29/2020 at 2030  . polyethylene glycol powder (GLYCOLAX/MIRALAX) 17 GM/SCOOP powder Take 17 g by mouth daily as needed for mild constipation or moderate  constipation.       Assessment: Pharmacy consulted to dose heparin in patient with h/o Afib and mechanical AVR/MVR.  INR on admission 1.8 and last dose 7/5 2030.  Home dose listed as 5 mg Tues and 10 mg ROW.  Goal of Therapy:  INR Goal 2.0-2.5 (per anti coag clinic- Dr Tanna Furry INR goal decreased 04/30/17) Monitor platelets by anticoagulation protocol: Yes   Plan:  Warfarin 12.5 mg x 1 dose. Monitor daily INR and s/s of bleeding.  Margot Ables, PharmD Clinical Pharmacist 03/31/2020 8:12 AM

## 2020-03-31 NOTE — Progress Notes (Signed)
Pt requested and was given Fleets enema this afternoon with good results. Large amount hard round balls of stool passed after enema. Pt states she feels much better now.

## 2020-04-01 LAB — CBC
HCT: 31.6 % — ABNORMAL LOW (ref 36.0–46.0)
Hemoglobin: 9.7 g/dL — ABNORMAL LOW (ref 12.0–15.0)
MCH: 28.7 pg (ref 26.0–34.0)
MCHC: 30.7 g/dL (ref 30.0–36.0)
MCV: 93.5 fL (ref 80.0–100.0)
Platelets: 208 10*3/uL (ref 150–400)
RBC: 3.38 MIL/uL — ABNORMAL LOW (ref 3.87–5.11)
RDW: 16.7 % — ABNORMAL HIGH (ref 11.5–15.5)
WBC: 5.7 10*3/uL (ref 4.0–10.5)
nRBC: 0 % (ref 0.0–0.2)

## 2020-04-01 LAB — BASIC METABOLIC PANEL
Anion gap: 10 (ref 5–15)
BUN: 38 mg/dL — ABNORMAL HIGH (ref 8–23)
CO2: 33 mmol/L — ABNORMAL HIGH (ref 22–32)
Calcium: 8.9 mg/dL (ref 8.9–10.3)
Chloride: 88 mmol/L — ABNORMAL LOW (ref 98–111)
Creatinine, Ser: 1.4 mg/dL — ABNORMAL HIGH (ref 0.44–1.00)
GFR calc Af Amer: 39 mL/min — ABNORMAL LOW (ref >60)
GFR calc non Af Amer: 34 mL/min — ABNORMAL LOW (ref >60)
Glucose, Bld: 92 mg/dL (ref 70–99)
Potassium: 4.3 mmol/L (ref 3.5–5.1)
Sodium: 131 mmol/L — ABNORMAL LOW (ref 135–145)

## 2020-04-01 LAB — PROTIME-INR
INR: 1.6 — ABNORMAL HIGH (ref 0.8–1.2)
Prothrombin Time: 18.4 seconds — ABNORMAL HIGH (ref 11.4–15.2)

## 2020-04-01 LAB — GLUCOSE, CAPILLARY
Glucose-Capillary: 82 mg/dL (ref 70–99)
Glucose-Capillary: 81 mg/dL (ref 70–99)
Glucose-Capillary: 60 mg/dL — ABNORMAL LOW (ref 70–99)
Glucose-Capillary: 100 mg/dL — ABNORMAL HIGH (ref 70–99)

## 2020-04-01 LAB — MAGNESIUM: Magnesium: 1.9 mg/dL (ref 1.7–2.4)

## 2020-04-01 MED ORDER — WARFARIN SODIUM 7.5 MG PO TABS
15.0000 mg | ORAL_TABLET | Freq: Once | ORAL | Status: AC
  Administered 2020-04-01: 15 mg via ORAL
  Filled 2020-04-01: qty 2

## 2020-04-01 NOTE — Progress Notes (Addendum)
Progress Note  Patient Name: [name] [name] Date of Encounter: 04/01/2020  Primary Cardiologist: Sanda Klein, MD   Subjective   No chest pain or palpitations. Walked with PT this morning. She does have baseline dyspnea on exertion. Lower extremity edema has improved.   Inpatient Medications    Scheduled Meds: . allopurinol  100 mg Oral Daily  . fluticasone  2 spray Each Nare q morning - 10a  . fluticasone furoate-vilanterol  1 puff Inhalation Daily  . furosemide  40 mg Intravenous BID  . gabapentin  300 mg Oral BID  . hydrALAZINE  25 mg Oral BID  . isosorbide mononitrate  15 mg Oral q morning - 10a  . levothyroxine  75 mcg Oral Q0600  . metoprolol succinate  100 mg Oral QPM  . Warfarin - Pharmacist Dosing Inpatient   Does not apply q1600   Continuous Infusions:  PRN Meds: acetaminophen **OR** acetaminophen, albuterol, hydrALAZINE, ondansetron **OR** ondansetron (ZOFRAN) IV, polyethylene glycol, sodium phosphate   Vital Signs    Vitals:   03/31/20 2057 03/31/20 2348 04/01/20 0500 04/01/20 0509  BP: (!) 150/83 118/66  (!) 146/75  Pulse: (!) 59 60  60  Resp: 16 18  16   Temp: 98.3 F (36.8 C)   98.2 F (36.8 C)  TempSrc: Oral   Oral  SpO2: 100% 97%  100%  Weight:   86.2 kg   Height:        Intake/Output Summary (Last 24 hours) at 04/01/2020 0946 Last data filed at 04/01/2020 0626 Gross per 24 hour  Intake 340 ml  Output 2400 ml  Net -2060 ml    Last 3 Weights 04/01/2020 03/31/2020 03/30/2020  Weight (lbs) 190 lb 0.6 oz 194 lb 14.2 oz 197 lb  Weight (kg) 86.2 kg 88.4 kg 89.359 kg      Telemetry    V-paced, HR in 60's. Occasional PVC's. Episodes of wide-complex tachycardia appear to be most consistent with paced beats as pacer spikes are noted.  - Personally Reviewed  ECG    No new tracings.   Physical Exam   General: Well developed, well nourished, female appearing in no acute distress. Head: Normocephalic, atraumatic.  Neck: Supple without bruits, JVD not  elevated. Lungs:  Resp regular and unlabored, occasional crackles.  Heart: Irregularly irregular, S1, S2, no S3, S4, or murmur; no rub. Abdomen: Soft, non-tender, non-distended with normoactive bowel sounds. No hepatomegaly. No rebound/guarding. No obvious abdominal masses. Extremities: No clubbing or cyanosis, 1+ pitting edema bilaterally. Distal pedal pulses are 2+ bilaterally. Neuro: Alert and oriented X 3. Moves all extremities spontaneously. Psych: Normal affect.  Labs    Chemistry Recent Labs  Lab 03/30/20 1202 03/31/20 0442 04/01/20 0502  NA 130* 131* 131*  K 5.4* 5.0 4.3  CL 90* 91* 88*  CO2 31 32 33*  GLUCOSE 97 97 92  BUN 44* 40* 38*  CREATININE 1.50* 1.40* 1.40*  CALCIUM 9.4 8.9 8.9  PROT 7.5  --   --   ALBUMIN 3.8  --   --   AST 28  --   --   ALT 20  --   --   ALKPHOS 51  --   --   BILITOT 0.4  --   --   GFRNONAA 31* 34* 34*  GFRAA 36* 39* 39*  ANIONGAP 9 8 10      Hematology Recent Labs  Lab 03/30/20 1202 04/01/20 0502  WBC 6.3 5.7  RBC 3.44* 3.38*  HGB 10.1* 9.7*  HCT 32.8*  31.6*  MCV 95.3 93.5  MCH 29.4 28.7  MCHC 30.8 30.7  RDW 17.2* 16.7*  PLT 208 208    Cardiac EnzymesNo results for input(s): TROPONINI in the last 168 hours. No results for input(s): TROPIPOC in the last 168 hours.   BNP Recent Labs  Lab 03/30/20 1202  BNP 502.0*     DDimer No results for input(s): DDIMER in the last 168 hours.   Radiology    DG Chest Portable 1 View  Result Date: 03/30/2020 CLINICAL DATA:  Shortness of breath and CHF EXAM: PORTABLE CHEST 1 VIEW COMPARISON:  Jan 30, 2020 FINDINGS: Again noted is stable cardiomegaly. Aortic knob calcifications are seen. Overlying median sternotomy wires and prosthetic valves are noted. A left-sided pacemaker noted with the lead tips in the right atrium and right ventricle. Mildly increased interstitial markings are again noted throughout both lungs, likely chronic lung changes. No new airspace consolidation or pleural  effusion. Advanced left shoulder osteoarthritis is seen with cystic/erosive type change at the humeral head. IMPRESSION: No active disease.  Unchanged cardiomegaly Electronically Signed   By: Prudencio Pair M.D.   On: 03/30/2020 14:48    Cardiac Studies   Echocardiogram: 01/2020 IMPRESSIONS    1. Left ventricular ejection fraction, by estimation, is 30 to 35%. The  left ventricle has moderately decreased function. The left ventricle  demonstrates global hypokinesis. There is mild concentric left ventricular  hypertrophy. Left ventricular  diastolic function could not be evaluated.  2. Right ventricular systolic function is normal. The right ventricular  size is normal. There is severely elevated pulmonary artery systolic  pressure. The estimated right ventricular systolic pressure is 54.0 mmHg.  3. Left atrial size was severely dilated.  4. Right atrial size was moderately dilated.  5. The mitral valve has been repaired/replaced. No evidence of mitral  valve regurgitation.  6. The aortic valve has been repaired/replaced. Aortic valve  regurgitation is not visualized.   Patient Profile     86 y.o. female w/ PMH of CHB (s/p Medtronic BiV PPM in 01/2019), persistent atrial fibrillation, chronic combined systolic and diastolic CHF (EF previously as low as 20-25%, normalized by repeat echo in 2019 and 02/2019, EF 30-35% by echocardiogram in 01/2020), mechanical AVR in 2001 and mechanical MVR in 2004, HTN, HLD, Type 2 DM and Stage 3 CKD who is currently admitted for an acute CHF exacerbation.    Assessment & Plan    1. Acute on Chronic Combined Systolic and Diastolic CHF - Her EF has been variable as this was previously 20-25% but normalized in 2019 and 2020, again reduced to 30-35% in 01/2020. Her device was reprogrammed as it was not felt to be capturing appropriately and this could have contributed to her recurrent cardiomyopathy.  - She presented with lower extremity edema and  dyspnea with weight having increased by 10+ pounds within the past few months as her baseline weight is 185 - 186 lbs, elevated to 197 lbs on admission. She has been receiving IV Lasix 40mg  BID with a recorded output of -4.8L thus far. Renal function and K+ remain stable. Na+ at 131. Weight down to 190 lbs. Would continue IV Lasix today and plan to transition to PO Lasix tomorrow. Was on 40mg  daily prior to admission and will likely require 60mg  daily or 40mg  BID.  - Continue Toprol-XL, Imdur and Hydralazine for her cardiomyopathy. Not on ACE-I/ARB/ARNI due to cardiomyopathy.   2. CHB  - She is s/p Medtronic BiV PPM in 01/2019 which  is followed by Dr. Lovena Le in the outpatient setting. She has been V-paced this admission.   3. Persistent Atrial Fibrillation/Flutter - HR has been in the 60's by review of telemetry. Continue Toprol-XL 100mg  daily for rate-control and Coumadin for anticoagulation.   4. History of Mechanical AVR in 2001 and Mechanical MVR in 2004 - most recent imaging in 01/2020 showed normal valve functioning as outlined above. She remains on Coumadin for anticoagulation. INR subtherapeutic at 1.6 this AM and Pharmacy following.   5. Stage 3 CKD - Creatinine was elevated to 1.50 on admission, improved to 1.40 this AM which seems close to her baseline of 1.30 to 1.50.    For questions or updates, please contact [place] Please consult [email or web address] for contact info under Cardiology/STEMI.   Arna Medici , PA-C 9:46 AM 04/01/2020 Pager: [phone]   Attending note:  Patient seen and examined.  Interval hospital course reviewed, discussed with Ms. Ahmed Prima PA-C.  I agree with her above findings.  Patient has had a nice response to diuresis so far, but still not back to baseline and with residual peripheral edema.  Net output of approximately 4.8 L so far.  Renal function holding steady, creatinine 1.4.  Would continue IV Lasix through today with  tentative discharge tomorrow.  We will determine whether outpatient dose of Lasix should be increased to 60 mg daily or perhaps 40 mg twice daily.  Check  BMET in a.m.  Otherwise continue Toprol-XL, hydralazine, and Imdur.  Satira Sark, M.D., F.A.C.C.

## 2020-04-01 NOTE — TOC Initial Note (Signed)
Transition of Care [facility]) - Initial/Assessment Note    Patient Details  Name: [name] [name] MRN: [id number] Date of Birth: 08/04/1932  Transition of Care [facility]) CM/SW Contact:    Shade Flood, LCSW Phone Number: 04/01/2020, 4:25 PM  Clinical Narrative:                  Pt admitted from home. Pt has high readmission risk score.  Met with pt today to assess. Pt sitting up in recliner and alert and oriented x4. Per pt, she and her nephew live together. Pt's nephew works during the day. Per pt she is fairly independent in ADLs at home and she has an aide 5 days a week to help her. Pt's niece takes her to appointments and pt states several are in [place].  Pt states she had a cane in the ED with her and it is now lost. She also states that she has a walker at home. Pt was receiving [facility]PT from [facility] prior to admission. PT recommending pt continue with his service and pt is in agreement. TOC will keep [facility] rep updated.  DC timeframe not yet known. TOC will follow and assist as needed.  Expected Discharge Plan: [place] Barriers to Discharge: Continued Medical Work up   Patient Goals and CMS Choice        Expected Discharge Plan and Services Expected Discharge Plan: [name] In-house Referral: Clinical Social Work   Post Acute Care Choice: Resumption of Svcs/PTA Provider Living arrangements for the past 2 months: Single Family Home                                      Prior Living Arrangements/Services Living arrangements for the past 2 months: Single Family Home Lives with:: Adult Children Patient language and need for interpreter reviewed:: Yes Do you feel safe going back to the place where you live?: Yes      Need for Family Participation in Patient Care: Yes (Comment) Care giver support system in place?: Yes (comment) Current home services: DME, Home PT Criminal Activity/Legal Involvement Pertinent to Current  Situation/Hospitalization: No - Comment as needed  Activities of Daily Living Home Assistive Devices/Equipment: Environmental consultant (specify type), Cane (specify quad or straight), CBG Meter, Dentures (specify type), Eyeglasses ADL Screening (condition at time of admission) Patient's cognitive ability adequate to safely complete daily activities?: Yes Is the patient deaf or have difficulty hearing?: No Does the patient have difficulty seeing, even when wearing glasses/contacts?: No Does the patient have difficulty concentrating, remembering, or making decisions?: No Patient able to express need for assistance with ADLs?: Yes Does the patient have difficulty dressing or bathing?: Yes Independently performs ADLs?: No Communication: Independent Dressing (OT): Needs assistance Is this a change from baseline?: Pre-admission baseline Grooming: Needs assistance Is this a change from baseline?: Pre-admission baseline Feeding: Independent Bathing: Needs assistance Is this a change from baseline?: Pre-admission baseline Toileting: Needs assistance Is this a change from baseline?: Pre-admission baseline In/Out Bed: Needs assistance Is this a change from baseline?: Pre-admission baseline Walks in Home: Needs assistance Is this a change from baseline?: Pre-admission baseline Does the patient have difficulty walking or climbing stairs?: Yes Weakness of Legs: Both Weakness of Arms/Hands: None  Permission Sought/Granted                  Emotional Assessment Appearance:: Appears younger than stated age  Attitude/Demeanor/Rapport: Engaged Affect (typically observed): Pleasant Orientation: : Oriented to Self, Oriented to Place, Oriented to  Time, Oriented to Situation Alcohol / Substance Use: Not Applicable Psych Involvement: No (comment)  Admission diagnosis:  Bilateral lower extremity edema [R60.0] Acute on chronic congestive heart failure, unspecified heart failure type (HCC) [I50.9] Decompensated  heart failure (HCC) [I50.9] Acute systolic CHF (congestive heart failure) (HCC) [I50.21] Patient Active Problem List   Diagnosis Date Noted  . Acute systolic CHF (congestive heart failure) (HCC) 03/31/2020  . Bilateral lower extremity edema   . Acute on chronic congestive heart failure (HCC) 03/30/2020  . Right-sided epistaxis 03/28/2020  . Acute pain of left shoulder 01/15/2020  . AAA (abdominal aortic aneurysm) without rupture (HCC) 01/14/2020  . COVID-19 virus detected 11/23/2019  . Dyspnea 05/30/2019  . Anemia 05/30/2019  . Gout   . Glaucoma   . GERD (gastroesophageal reflux disease)   . Physical debility 04/28/2019  . Encounter for examination following treatment at hospital 11/03/2018  . Hyponatremia 09/27/2018  . H/O mitral valve replacement with mechanical valve 08/02/2018  . Chronic right hip pain 04/16/2018  . Chronic systolic congestive heart failure (HCC) 11/15/2017  . Trochanteric bursitis, right hip 03/07/2017  . Persistent atrial fibrillation (HCC)   . Nocturnal hypoxia 10/08/2016  . Dependence on nocturnal oxygen therapy 10/08/2016  . Hematuria 03/20/2016  . S/P ICD (internal cardiac defibrillator) procedure 02/08/2015  . Presence of cardiac pacemaker 02/08/2015  . CHB (complete heart block) (HCC) 10/07/2014  . Fatigue 07/20/2014  . Absolute anemia 06/24/2014  . Osteopenia 02/10/2014  . Encounter for therapeutic drug monitoring 10/29/2013  . OA (osteoarthritis) of knee 02/19/2013  . Chronic pain of right knee 01/02/2013  . Hemolytic anemia (HCC) 09/24/2012  . High risk medication use 05/24/2012  . COPD (chronic obstructive pulmonary disease) (HCC) 05/19/2012  . Chronic anticoagulation, on coumadin for Mechanical AVR and MVR 04/01/2012  . Cardiorenal syndrome with renal failure 03/22/2012  . Hx of aortic valve replacement, mechanical 03/22/2012  . S/P MVR (mitral valve replacement) 03/22/2012  . Biventricular automatic implantable cardioverter defibrillator  in situ 03/22/2012  . CKD (chronic kidney disease), stage IV (HCC) 03/22/2012  . Warfarin-induced coagulopathy (HCC) 03/21/2012  . Iron deficiency 10/04/2011  . Allergic rhinitis 02/14/2011  . Hypothyroidism 07/15/2010  . Mitral valve disease 07/15/2010  . Sinus node dysfunction (HCC) 07/15/2010  . VARICOSE VEINS LOWER EXTREMITIES W/OTH COMPS 02/09/2009  . Prediabetes 06/15/2008  . Hypothyroidism, postsurgical 02/24/2008  . Hyperlipidemia LDL goal <70 02/24/2008  . Essential hypertension 02/24/2008  . Non-ischemic cardiomyopathy with ICD 02/24/2008  . DEGENERATIVE JOINT DISEASE, SPINE 02/24/2008   PCP:  Simpson, Margaret E, MD Pharmacy:   [id number] - [place], [place] - [address] AT [address] OF [address] [place] [address] Phone: [phone] Fax: [phone]  [facility] - [place], [place] - [address] AT Portal to Registered [facility] Sites [address] [place] [address] Phone: [phone] Fax: [phone]  [facility] [address] [place] [address] Phone: [phone] Fax: [phone]     Social Determinants of Health (SDOH) Interventions    Readmission Risk Interventions Readmission Risk Prevention Plan 04/01/2020 04/01/2020 02/03/2020  Transportation Screening Complete Complete Complete  PCP or Specialist Appt within 3-5 Days - - -  HRI or Home Care Consult - - -  Social Work Consult for Recovery Care Planning/Counseling - - -  Palliative Care Screening - - -  Medication Review (RN   Care Manager) Complete - Referral to Pharmacy  PCP or Specialist appointment within 3-5 days of discharge - - Complete  HRI or Home Care Consult Complete Complete Complete  SW Recovery Care/Counseling Consult Complete - Complete  Palliative Care Screening Not Applicable Not Applicable Not [place] Not Applicable Not Applicable Not Applicable  Some recent data might be  hidden

## 2020-04-01 NOTE — Plan of Care (Signed)
  Problem: Acute Rehab PT Goals(only PT should resolve) Goal: Pt Will Go Supine/Side To Sit Outcome: Progressing Flowsheets (Taken 04/01/2020 1042) Pt will go Supine/Side to Sit:  with modified independence  Independently Goal: Patient Will Transfer Sit To/From Stand Outcome: Progressing Flowsheets (Taken 04/01/2020 1042) Patient will transfer sit to/from stand:  with modified independence  with supervision Goal: Pt Will Transfer Bed To Chair/Chair To Bed Outcome: Progressing Flowsheets (Taken 04/01/2020 1042) Pt will Transfer Bed to Chair/Chair to Bed: with supervision Goal: Pt Will Ambulate Outcome: Progressing Flowsheets (Taken 04/01/2020 1042) Pt will Ambulate:  75 feet  with supervision  with rolling walker   10:42 AM, 04/01/20 Lonell Grandchild, MPT Physical Therapist with [facility] [phone] office [phone] mobile phone

## 2020-04-01 NOTE — Evaluation (Signed)
Physical Therapy Evaluation Patient Details Name: [name] [name] MRN: [id number] DOB: 1932/04/15 Today's Date: 04/01/2020   History of Present Illness  [name] [name] is a 86 y.o. female with medical history significant for COPD, diastolic CHF, on chronic 2 L of oxygen, complete heart block, AICD/pacemaker status, aortic and mitral valve replacement on chronic anticoagulation, diabetes mellitus.Patient presented to the ED with complaints of bilateral lower extremity swelling over the past 3 to 4 days, she also reports difficulty breathing worse with exertion improved with rest.  She reports compliance with her medications which include Lasix.  No chest pain.  Reports a cough mostly at night, productive of whitish sputum.Patient lives with her nephew who has to go to work.  At baseline patient ambulates with a walker.    Clinical Impression  Patient functioning near baseline for functional mobility and gait, has to lean on nearby objects when using SPC due to weakness and required the use of RW for safety demonstrating good return for ambulation in room and hallways without loss of balance while on 2 LPM with SpO2 at 94%.  Patient tolerated sitting up in chair after therapy - RN aware.  Patient will benefit from continued physical therapy in hospital and recommended venue below to increase strength, balance, endurance for safe ADLs and gait.      Follow Up Recommendations Home health PT;Supervision for mobility/OOB;Supervision - Intermittent    Equipment Recommendations  None recommended by PT    Recommendations for Other Services       Precautions / Restrictions Precautions Precautions: Fall Restrictions Weight Bearing Restrictions: No      Mobility  Bed Mobility Overal bed mobility: Needs Assistance Bed Mobility: Supine to Sit     Supine to sit: Modified independent (Device/Increase time);Supervision     General bed mobility comments: increased time  Transfers Overall  transfer level: Needs assistance Equipment used: Rolling walker (2 wheeled);Straight cane Transfers: Sit to/from [facility] Sit to Stand: Supervision Stand pivot transfers: Min guard       General transfer comment: had to lean on nearby objects for support when using SPC, safer using RW  Ambulation/Gait Ambulation/Gait assistance: Supervision;Min guard Gait Distance (Feet): 45 Feet Assistive device: Rolling walker (2 wheeled) Gait Pattern/deviations: Decreased step length - right;Decreased step length - left;Decreased stride length;Trunk flexed Gait velocity: decreased   General Gait Details: slow slightly labored cadence without loss of balance, has to lean over RW for support with kyphotic posture  Stairs            Wheelchair Mobility    Modified Rankin (Stroke Patients Only)       Balance Overall balance assessment: Needs assistance Sitting-balance support: Feet supported;No upper extremity supported Sitting balance-Leahy Scale: Good Sitting balance - Comments: seated at EOB   Standing balance support: During functional activity;Single extremity supported Standing balance-Leahy Scale: Poor Standing balance comment: fair/poor using SPC, fair/good using RW                             Pertinent Vitals/Pain Pain Assessment: No/denies pain    Home Living Family/patient expects to be discharged to:: Private residence Living Arrangements: Other relatives Available Help at Discharge: Family;Available PRN/intermittently;Personal care attendant Type of Home: House Home Access: Ramped entrance     Home Layout: One level Home Equipment: Cane - single point;Walker - 2 wheels;Bedside commode;Shower seat      Prior Function Level of Independence: Needs assistance   Gait / Transfers  Assistance Needed: household and short distanced community ambulator using [facility]  ADL's / Homemaking Assistance Needed: home aides 5 days/week from 10:am to  3:pm        Hand Dominance   Dominant Hand: Right    Extremity/Trunk Assessment   Upper Extremity Assessment Upper Extremity Assessment: Generalized weakness    Lower Extremity Assessment Lower Extremity Assessment: Generalized weakness    Cervical / Trunk Assessment Cervical / Trunk Assessment: Kyphotic  Communication   Communication: No difficulties  Cognition Arousal/Alertness: Awake/alert Behavior During Therapy: WFL for tasks assessed/performed Overall Cognitive Status: Within Functional Limits for tasks assessed                                        General Comments      Exercises     Assessment/Plan    PT Assessment Patient needs continued PT services  PT Problem List Decreased strength;Decreased activity tolerance;Decreased balance;Decreased mobility       PT Treatment Interventions Balance training;Gait training;Stair training;Functional mobility training;Therapeutic activities;Therapeutic exercise;DME instruction;Patient/family education    PT Goals (Current goals can be found in the Care Plan section)  Acute Rehab PT Goals Patient Stated Goal: return home with family, home aides to assist PT Goal Formulation: With patient Time For Goal Achievement: 04/05/20 Potential to Achieve Goals: Good    Frequency Min 3X/week   Barriers to discharge        Co-evaluation               AM-PAC PT "6 Clicks" Mobility  Outcome Measure Help needed turning from your back to your side while in a flat bed without using bedrails?: None Help needed moving from lying on your back to sitting on the side of a flat bed without using bedrails?: None Help needed moving to and from a bed to a chair (including a wheelchair)?: A Little Help needed standing up from a chair using your arms (e.g., wheelchair or bedside chair)?: A Little Help needed to walk in hospital room?: A Little Help needed climbing 3-5 steps with a railing? : A Lot 6 Click  Score: 19    End of Session Equipment Utilized During Treatment: Oxygen Activity Tolerance: Patient tolerated treatment well;Patient limited by fatigue Patient left: in chair;with call bell/phone within reach Nurse Communication: Mobility status PT Visit Diagnosis: Unsteadiness on feet (R26.81);Other abnormalities of gait and mobility (R26.89);Muscle weakness (generalized) (M62.81)    Time: 4403-4742 PT Time Calculation (min) (ACUTE ONLY): 20 min   Charges:   PT Evaluation $PT Eval Moderate Complexity: 1 Mod PT Treatments $Therapeutic Activity: 8-22 mins        10:41 AM, 04/01/20 Lonell Grandchild, MPT Physical Therapist with [facility] [phone] office [phone] mobile phone

## 2020-04-01 NOTE — Progress Notes (Addendum)
PROGRESS NOTE   [name] [name]  RKY:[id number] DOB: [birth year] DOA: 03/30/2020 PCP: Fayrene Helper, MD   Chief Complaint  Patient presents with   Leg Swelling   Brief Admission History:  86 y.o. female with medical history significant for COPD, diastolic CHF, on chronic 2 L of oxygen, complete heart block, AICD/pacemaker status, aortic and mitral valve replacement on chronic anticoagulation, diabetes mellitus.  Patient presented to the ED with complaints of bilateral lower extremity swelling over the past 3 to 4 days, she also reports difficulty breathing worse with exertion improved with rest.    Assessment & Plan:   Principal Problem:   Acute on chronic congestive heart failure (HCC) Active Problems:   Essential hypertension   Hx of aortic valve replacement, mechanical   S/P MVR (mitral valve replacement)   Biventricular automatic implantable cardioverter defibrillator in situ   CKD (chronic kidney disease), stage IV (HCC)   Chronic anticoagulation, on coumadin for Mechanical AVR and MVR   COPD (chronic obstructive pulmonary disease) (HCC)   Persistent atrial fibrillation (HCC)   Chronic systolic congestive heart failure (HCC)   Acute systolic CHF (congestive heart failure) (HCC)   Bilateral lower extremity edema  Acute on chronic combined systolic and diastolic CHF - Pt is diuresing well on IV lasix and weight is slowly trending down. She remains over dry weight but much improved.  Renal function is improving. Continue  IV lasix for 1 more day and planning to transition to oral diuretic tomorrow per cardiology team.   Hyponatremia - slightly improved with diuresis but stable today, recheck in AM.   Chronic hypoxic respiratory failure - stable on chronic supplemental oxygen 2L/min. Essential hypertension - slowly improved with diuresis, resumed home meds.  Complete heart block s/p PPM - stable.  Continued home metoprolol.  Stage IV CKD - creatinine stable with IV lasix and  diuresis.  Mechanical mitral and aortic valve - warfarin subtherapeutic, follow closely, pharmacy dosing warfarin in hospital.  Persistent atrial fibrillation - rate controlled and anticoagulated.  Anemia of chronic disease - follow HG.  Hypothyroidism - resume home levothyroxine dose.  Type 2 diabetes mellitus with vascular complications - follow CBG and give SSI coverage. Currently stable and well controlled.   DVT prophylaxis: warfarin  Code Status: DNR  Family Communication:  Spoke with niece by telephone update 04/01/20 Disposition: Home with [place]   Status is: Inpatient  Remains inpatient appropriate because:IV treatments appropriate due to intensity of illness or inability to take PO, Continue 1 more day of IV lasix and plan to transition to oral lasix tomorrow.   Dispo: The patient is from: Home              Anticipated d/c is to: Home              Anticipated d/c date is: 1 days              Patient currently is not medically stable to d/c.  Consultants:  Cardiology   Procedures:    Antimicrobials:  N/a  Subjective: Pt reports feeling SOB.  NO chest pain. No cough.   Objective: Vitals:   03/31/20 2348 04/01/20 0500 04/01/20 0509 04/01/20 0958  BP: 118/66  (!) 146/75 136/70  Pulse: 60  60   Resp: 18  16   Temp:   98.2 F (36.8 C)   TempSrc:   Oral   SpO2: 97%  100%   Weight:  86.2 kg    Height:  Intake/Output Summary (Last 24 hours) at 04/01/2020 1044 Last data filed at 04/01/2020 0900 Gross per 24 hour  Intake 580 ml  Output 2400 ml  Net -1820 ml   Filed Weights   03/30/20 1132 03/31/20 0430 04/01/20 0500  Weight: 89.4 kg 88.4 kg 86.2 kg    Examination:  General exam: Appears calm and comfortable. Sitting up in bed eating and drinking.   Respiratory system: Clear to auscultation. Respiratory effort normal. Cardiovascular system: S1 & S2 heard, RRR. No JVD, murmurs, rubs, gallops or clicks. No pedal edema. Gastrointestinal system: Abdomen is  nondistended, soft and nontender. No organomegaly or masses felt. Normal bowel sounds heard. Central nervous system: Alert and oriented. No focal neurological deficits. Extremities: trace edema BLEs, Symmetric 5 x 5 power. Skin: No rashes, lesions or ulcers Psychiatry: Judgement and insight appear normal. Mood & affect appropriate.   Data Reviewed: I have personally reviewed following labs and imaging studies  CBC: Recent Labs  Lab 03/30/20 1202 04/01/20 0502  WBC 6.3 5.7  HGB 10.1* 9.7*  HCT 32.8* 31.6*  MCV 95.3 93.5  PLT 208 417    Basic Metabolic Panel: Recent Labs  Lab 03/30/20 1202 03/31/20 0442 04/01/20 0502  NA 130* 131* 131*  K 5.4* 5.0 4.3  CL 90* 91* 88*  CO2 31 32 33*  GLUCOSE 97 97 92  BUN 44* 40* 38*  CREATININE 1.50* 1.40* 1.40*  CALCIUM 9.4 8.9 8.9  MG  --   --  1.9    GFR: Estimated Creatinine Clearance: 32.5 mL/min (A) (by C-G formula based on SCr of 1.4 mg/dL (H)).  Liver Function Tests: Recent Labs  Lab 03/30/20 1202  AST 28  ALT 20  ALKPHOS 51  BILITOT 0.4  PROT 7.5  ALBUMIN 3.8    CBG: Recent Labs  Lab 03/30/20 2115 03/31/20 1105 03/31/20 1623 03/31/20 2041 04/01/20 0737  GLUCAP 95 100* 122* 105* 82    Recent Results (from the past 240 hour(s))  SARS Coronavirus 2 by RT PCR (hospital order, performed in [facility] hospital lab) Nasopharyngeal Nasopharyngeal Swab     Status: None   Collection Time: 03/30/20  7:06 PM   Specimen: Nasopharyngeal Swab  Result Value Ref Range Status   SARS Coronavirus 2 NEGATIVE NEGATIVE Final    Comment: (NOTE) SARS-CoV-2 target nucleic acids are NOT DETECTED.  The SARS-CoV-2 RNA is generally detectable in upper and lower respiratory specimens during the acute phase of infection. The lowest concentration of SARS-CoV-2 viral copies this assay can detect is 250 copies / mL. A negative result does not preclude SARS-CoV-2 infection and should not be used as the sole basis for treatment or  other patient management decisions.  A negative result may occur with improper specimen collection / handling, submission of specimen other than nasopharyngeal swab, presence of viral mutation(s) within the areas targeted by this assay, and inadequate number of viral copies (<250 copies / mL). A negative result must be combined with clinical observations, patient history, and epidemiological information.  Fact Sheet for Patients:   StrictlyIdeas.no  Fact Sheet for Healthcare Providers: BankingDealers.co.za  This test is not yet approved or  cleared by the [place] FDA and has been authorized for detection and/or diagnosis of SARS-CoV-2 by FDA under an Emergency Use Authorization (EUA).  This EUA will remain in effect (meaning this test can be used) for the duration of the COVID-19 declaration under Section 564(b)(1) of the Act, 21 U.S.C. section 360bbb-3(b)(1), unless the authorization is terminated or  revoked sooner.  Performed at [facility], [address], [place], [place] [address]      Radiology Studies: DG Chest Portable 1 View  Result Date: 03/30/2020 CLINICAL DATA:  Shortness of breath and CHF EXAM: PORTABLE CHEST 1 VIEW COMPARISON:  Jan 30, 2020 FINDINGS: Again noted is stable cardiomegaly. Aortic knob calcifications are seen. Overlying median sternotomy wires and prosthetic valves are noted. A left-sided pacemaker noted with the lead tips in the right atrium and right ventricle. Mildly increased interstitial markings are again noted throughout both lungs, likely chronic lung changes. No new airspace consolidation or pleural effusion. Advanced left shoulder osteoarthritis is seen with cystic/erosive type change at the humeral head. IMPRESSION: No active disease.  Unchanged cardiomegaly Electronically Signed   By: Prudencio Pair M.D.   On: 03/30/2020 14:48   Scheduled Meds:  allopurinol  100 mg Oral Daily   fluticasone  2 spray  Each Nare q morning - 10a   fluticasone furoate-vilanterol  1 puff Inhalation Daily   furosemide  40 mg Intravenous BID   gabapentin  300 mg Oral BID   hydrALAZINE  25 mg Oral BID   isosorbide mononitrate  15 mg Oral q morning - 10a   levothyroxine  75 mcg Oral Q0600   metoprolol succinate  100 mg Oral QPM   Warfarin - Pharmacist Dosing Inpatient   Does not apply q1600   Continuous Infusions:    LOS: 1 day   Time spent: 25 mins   Hedi Barkan Wynetta Emery, MD How to contact the [facility] [employer] Attending or Consulting provider [place] or covering provider during after hours [place], for this patient?  Check the care team in [facility] and look for a) attending/consulting TRH provider listed and b) the [facility] team listed Log into [email or web address] and use [place]'s universal password to access. If you do not have the password, please contact the hospital operator. Locate the [facility] provider you are looking for under [facility] and page to a number that you can be directly reached. If you still have difficulty reaching the provider, please page the [facility] (Director on Call) for the Hospitalists listed on amion for assistance.  04/01/2020, 10:44 AM

## 2020-04-01 NOTE — Progress Notes (Signed)
ANTICOAGULATION CONSULT NOTE - Initial Consult  Pharmacy Consult for warfarin Indication: h/o Afib and mechanical AVR/MVR  Allergies  Allergen Reactions  . Penicillins Other (See Comments)    Bruise    . Aspirin Other (See Comments)    On coumadin  . Fentanyl Dermatitis    Rash with patch   . Niacin Itching    Patient Measurements: Height: 5\' 7"  (170.2 cm) Weight: 86.2 kg (190 lb 0.6 oz) IBW/kg (Calculated) : 61.6   Vital Signs: Temp: 98.2 F (36.8 C) (07/08 0509) Temp Source: Oral (07/08 0509) BP: 146/75 (07/08 0509) Pulse Rate: 60 (07/08 0509)  Labs: Recent Labs    03/30/20 1202 03/31/20 0442 04/01/20 0502  HGB 10.1*  --  9.7*  HCT 32.8*  --  31.6*  PLT 208  --  208  LABPROT  --  20.6* 18.4*  INR  --  1.8* 1.6*  CREATININE 1.50* 1.40* 1.40*    Estimated Creatinine Clearance: 32.5 mL/min (A) (by C-G formula based on SCr of 1.4 mg/dL (H)).   Assessment: Pharmacy consulted to dose heparin in patient with h/o Afib and mechanical AVR/MVR.  INR on admission 1.8 and last dose 7/5 2030.  Home dose listed as 5 mg Tues and 10 mg ROW.  03/31/20 0930 INR: 1.6, below therapeutic goal range CBC: Hb 9.7  Plates: 208 No issues with bleeding at this time  Goal of Therapy:  INR Goal 2.0-2.5 (per anti coag clinic- Dr Tanna Furry INR goal decreased 04/30/17) Monitor platelets by anticoagulation protocol: Yes   Plan:  Give warfarin 15 mg x 1 dose tonight for INR 1.6 (50% boost dose) Monitor daily INR/CBC and s/s of bleeding.  Despina Pole, Pharm. D. Clinical Pharmacist 04/01/2020 9:23 AM

## 2020-04-02 DIAGNOSIS — I5043 Acute on chronic combined systolic (congestive) and diastolic (congestive) heart failure: Secondary | ICD-10-CM

## 2020-04-02 LAB — CBC
HCT: 31.5 % — ABNORMAL LOW (ref 36.0–46.0)
Hemoglobin: 9.8 g/dL — ABNORMAL LOW (ref 12.0–15.0)
MCH: 29.3 pg (ref 26.0–34.0)
MCHC: 31.1 g/dL (ref 30.0–36.0)
MCV: 94 fL (ref 80.0–100.0)
Platelets: 206 10*3/uL (ref 150–400)
RBC: 3.35 MIL/uL — ABNORMAL LOW (ref 3.87–5.11)
RDW: 16.8 % — ABNORMAL HIGH (ref 11.5–15.5)
WBC: 6 10*3/uL (ref 4.0–10.5)
nRBC: 0 % (ref 0.0–0.2)

## 2020-04-02 LAB — BASIC METABOLIC PANEL
Anion gap: 10 (ref 5–15)
BUN: 42 mg/dL — ABNORMAL HIGH (ref 8–23)
CO2: 34 mmol/L — ABNORMAL HIGH (ref 22–32)
Calcium: 8.7 mg/dL — ABNORMAL LOW (ref 8.9–10.3)
Chloride: 87 mmol/L — ABNORMAL LOW (ref 98–111)
Creatinine, Ser: 1.4 mg/dL — ABNORMAL HIGH (ref 0.44–1.00)
GFR calc Af Amer: 39 mL/min — ABNORMAL LOW (ref >60)
GFR calc non Af Amer: 34 mL/min — ABNORMAL LOW (ref >60)
Glucose, Bld: 92 mg/dL (ref 70–99)
Potassium: 4.3 mmol/L (ref 3.5–5.1)
Sodium: 131 mmol/L — ABNORMAL LOW (ref 135–145)

## 2020-04-02 LAB — MAGNESIUM: Magnesium: 1.8 mg/dL (ref 1.7–2.4)

## 2020-04-02 LAB — PROTIME-INR
INR: 1.7 — ABNORMAL HIGH (ref 0.8–1.2)
Prothrombin Time: 19 seconds — ABNORMAL HIGH (ref 11.4–15.2)

## 2020-04-02 LAB — GLUCOSE, CAPILLARY
Glucose-Capillary: 93 mg/dL (ref 70–99)
Glucose-Capillary: 142 mg/dL — ABNORMAL HIGH (ref 70–99)

## 2020-04-02 MED ORDER — ALBUTEROL SULFATE HFA 108 (90 BASE) MCG/ACT IN AERS: 1.0000 | INHALATION_SPRAY | Freq: Four times a day (QID) | RESPIRATORY_TRACT | Status: DC | PRN

## 2020-04-02 MED ORDER — FUROSEMIDE 20 MG PO TABS: 60.0000 mg | ORAL_TABLET | Freq: Every day | ORAL | 1 refills | Status: DC

## 2020-04-02 MED ORDER — POTASSIUM CHLORIDE CRYS ER 20 MEQ PO TBCR: 20.0000 meq | EXTENDED_RELEASE_TABLET | Freq: Every day | ORAL | Status: DC

## 2020-04-02 MED ORDER — ALBUTEROL SULFATE (2.5 MG/3ML) 0.083% IN NEBU: 2.5000 mg | INHALATION_SOLUTION | Freq: Two times a day (BID) | RESPIRATORY_TRACT | Status: DC

## 2020-04-02 MED ORDER — ALLOPURINOL 100 MG PO TABS: 100.0000 mg | ORAL_TABLET | Freq: Every morning | ORAL | Status: DC

## 2020-04-02 MED ORDER — SYNTHROID 75 MCG PO TABS: 75.0000 ug | ORAL_TABLET | Freq: Every day | ORAL | Status: DC

## 2020-04-02 MED ORDER — WARFARIN SODIUM 5 MG PO TABS: 5.0000 mg | ORAL_TABLET | ORAL | Status: DC

## 2020-04-02 MED ORDER — WARFARIN SODIUM 7.5 MG PO TABS: 15.0000 mg | ORAL_TABLET | Freq: Once | ORAL | Status: DC

## 2020-04-02 NOTE — Progress Notes (Signed)
ANTICOAGULATION CONSULT NOTE - Initial Consult  Pharmacy Consult for warfarin Indication: h/o Afib and mechanical AVR/MVR   Patient Measurements: Height: 5\' 7"  (170.2 cm) Weight: 87 kg (191 lb 12.8 oz) IBW/kg (Calculated) : 61.6   Vital Signs: Temp: 98.1 F (36.7 C) (07/09 0630) Temp Source: Oral (07/09 0630) BP: 135/66 (07/09 0630) Pulse Rate: 60 (07/09 0630)  Labs: Recent Labs    03/30/20 1202 03/30/20 1202 03/31/20 0442 04/01/20 0502 04/02/20 0420  HGB 10.1*   < >  --  9.7* 9.8*  HCT 32.8*  --   --  31.6* 31.5*  PLT 208  --   --  208 206  LABPROT  --   --  20.6* 18.4* 19.0*  INR  --   --  1.8* 1.6* 1.7*  CREATININE 1.50*   < > 1.40* 1.40* 1.40*   < > = values in this interval not displayed.    Estimated Creatinine Clearance: 32.7 mL/min (A) (by C-G formula based on SCr of 1.4 mg/dL (H)).   Assessment: Pharmacy consulted to dose heparin in patient with h/o Afib and mechanical AVR/MVR.  INR on admission 1.8 and last dose 7/5 2030.  Home dose listed as 5 mg Tues and 10 mg ROW.  7/9//21 1015 INR: 1.7-->  below therapeutic goal range despite   27.5mg  of warfarin on board in last 2 days CBC: Hb 9.8   Plates: 206  (hx of anemia of chronic disease) No issues with bleeding at this time  Goal of Therapy:  INR Goal 2.0-2.5 (per anti coag clinic- Dr Tanna Furry INR goal decreased 04/30/17) Monitor platelets by anticoagulation protocol: Yes   Plan:  Repeat warfarin 15 mg x 1 dose againtonight for INR 1.7 (50% boost dose) Monitor daily INR/CBC and s/s of bleeding.  Despina Pole, Pharm. D. Clinical Pharmacist 04/02/2020 10:17 AM

## 2020-04-02 NOTE — Discharge Instructions (Signed)
Please have your PT/INR checked next week to see if your warfarin dose needs to be adjusted.    Heart Failure, Self Care Heart failure is a serious condition. This sheet explains things you need to do to take care of yourself at home. To help you stay as healthy as possible, you may be asked to change your diet, take certain medicines, and make other changes in your life. Your doctor may also give you more specific instructions. If you have problems or questions, call your doctor. What are the risks? Having heart failure makes it more likely for you to have some problems. These problems can get worse if you do not take good care of yourself. Problems may include:  Blood clotting problems. This may cause a stroke.  Damage to the kidneys, liver, or lungs.  Abnormal heart rhythms. Supplies needed:  Scale for weighing yourself.  Blood pressure monitor.  Notebook.  Medicines. How to care for yourself when you have heart failure Medicines Take over-the-counter and prescription medicines only as told by your doctor. Take your medicines every day.  Do not stop taking your medicine unless your doctor tells you to do so.  Do not skip any medicines.  Get your prescriptions refilled before you run out of medicine. This is important. Eating and drinking   Eat heart-healthy foods. Talk with a diet specialist (dietitian) to create an eating plan.  Choose foods that: ? Have no trans fat. ? Are low in saturated fat and cholesterol.  Choose healthy foods, such as: ? Fresh or frozen fruits and vegetables. ? Fish. ? Low-fat (lean) meats. ? Legumes, such as beans, peas, and lentils. ? Fat-free or low-fat dairy products. ? Whole-grain foods. ? High-fiber foods.  Limit salt (sodium) if told by your doctor. Ask your diet specialist to tell you which seasonings are healthy for your heart.  Cook in healthy ways instead of frying. Healthy ways of cooking include roasting, grilling, broiling,  baking, poaching, steaming, and stir-frying.  Limit how much fluid you drink, if told by your doctor. Alcohol use  Do not drink alcohol if: ? Your doctor tells you not to drink. ? Your heart was damaged by alcohol, or you have very bad heart failure. ? You are pregnant, may be pregnant, or are planning to become pregnant.  If you drink alcohol: ? Limit how much you use to:  0-1 drink a day for women.  0-2 drinks a day for men. ? Be aware of how much alcohol is in your drink. In the [place] one drink equals one 12 oz bottle of beer (355 mL), one 5 oz glass of wine (148 mL), or one 1 oz glass of hard liquor (44 mL). Lifestyle   Do not use any products that contain nicotine or tobacco, such as cigarettes, e-cigarettes, and chewing tobacco. If you need help quitting, ask your doctor. ? Do not use nicotine gum or patches before talking to your doctor.  Do not use illegal drugs.  Lose weight if told by your doctor.  Do physical activity if told by your doctor. Talk to your doctor before you begin an exercise if: ? You are an older adult. ? You have very bad heart failure.  Learn to manage stress. If you need help, ask your doctor.  Get rehab (rehabilitation) to help you stay independent and to help with your quality of life.  Plan time to rest when you get tired. Check weight and blood pressure   Weigh yourself every  day. This will help you to know if fluid is building up in your body. ? Weigh yourself every morning after you pee (urinate) and before you eat breakfast. ? Wear the same amount of clothing each time. ? Write down your daily weight. Give your record to your doctor.  Check and write down your blood pressure as told by your doctor.  Check your pulse as told by your doctor. Dealing with very hot and very cold weather  If it is very hot: ? Avoid activities that take a lot of energy. ? Use air conditioning or fans, or find a cooler place. ? Avoid caffeine and  alcohol. ? Wear clothing that is loose-fitting, lightweight, and light-colored.  If it is very cold: ? Avoid activities that take a lot of energy. ? Layer your clothes. ? Wear mittens or gloves, a hat, and a scarf when you go outside. ? Avoid alcohol. Follow these instructions at home:  Stay up to date with shots (vaccines). Get pneumococcal and flu (influenza) shots.  Keep all follow-up visits as told by your doctor. This is important. Contact a doctor if:  You gain weight quickly.  You have increasing shortness of breath.  You cannot do your normal activities.  You get tired easily.  You cough a lot.  You don't feel like eating or feel like you may vomit (nauseous).  You become puffy (swell) in your hands, feet, ankles, or belly (abdomen).  You cannot sleep well because it is hard to breathe.  You feel like your heart is beating fast (palpitations).  You get dizzy when you stand up. Get help right away if:  You have trouble breathing.  You or someone else notices a change in your behavior, such as having trouble staying awake.  You have chest pain or discomfort.  You pass out (faint). These symptoms may be an emergency. Do not wait to see if the symptoms will go away. Get medical help right away. Call your local emergency services (911 in the [place] Do not drive yourself to the hospital. Summary  Heart failure is a serious condition. To care for yourself, you may have to change your diet, take medicines, and make other lifestyle changes.  Take your medicines every day. Do not stop taking them unless your doctor tells you to do so.  Eat heart-healthy foods, such as fresh or frozen fruits and vegetables, fish, lean meats, legumes, fat-free or low-fat dairy products, and whole-grain or high-fiber foods.  Ask your doctor if you can drink alcohol. You may have to stop alcohol use if you have very bad heart failure.  Contact your doctor if you gain weight quickly or  feel that your heart is beating too fast. Get help right away if you pass out, or have chest pain or trouble breathing. This information is not intended to replace advice given to you by your health care provider. Make sure you discuss any questions you have with your health care provider. Document Revised: 12/24/2018 Document Reviewed: 12/25/2018 Elsevier Patient Education  [place].   Heart Failure Action Plan A heart failure action plan helps you understand what to do when you have symptoms of heart failure. Follow the plan that was created by you and your health care provider. Review your plan each time you visit your health care provider. Red zone These signs and symptoms mean you should get medical help right away:  You have trouble breathing when resting.  You have a dry cough that  is getting worse.  You have swelling or pain in your legs or abdomen that is getting worse.  You suddenly gain more than 2-3 lb (0.9-1.4 kg) in a day, or more than 5 lb (2.3 kg) in one week. This amount may be more or less depending on your condition.  You have trouble staying awake or you feel confused.  You have chest pain.  You do not have an appetite.  You pass out. If you experience any of these symptoms:  Call your local emergency services (911 in the [place] right away or seek help at the emergency department of the nearest hospital. Yellow zone These signs and symptoms mean your condition may be getting worse and you should make some changes:  You have trouble breathing when you are active or you need to sleep with extra pillows.  You have swelling in your legs or abdomen.  You gain 2-3 lb (0.9-1.4 kg) in one day, or 5 lb (2.3 kg) in one week. This amount may be more or less depending on your condition.  You get tired easily.  You have trouble sleeping.  You have a dry cough. If you experience any of these symptoms:  Contact your health care provider within the next  day.  Your health care provider may adjust your medicines. Green zone These signs mean you are doing well and can continue what you are doing:  You do not have shortness of breath.  You have very little swelling or no new swelling.  Your weight is stable (no gain or loss).  You have a normal activity level.  You do not have chest pain or any other new symptoms. Follow these instructions at home:  Take over-the-counter and prescription medicines only as told by your health care provider.  Weigh yourself daily. Your target weight is __________ lb (__________ kg). ? Call your health care provider if you gain more than __________ lb (__________ kg) in a day, or more than __________ lb (__________ kg) in one week.  Eat a heart-healthy diet. Work with a diet and nutrition specialist (dietitian) to create an eating plan that is best for you.  Keep all follow-up visits as told by your health care provider. This is important. Where to find more information  American Heart Association: www.heart.org Summary  Follow the action plan that was created by you and your health care provider.  Get help right away if you have any symptoms in the Red zone. This information is not intended to replace advice given to you by your health care provider. Make sure you discuss any questions you have with your health care provider. Document Revised: 08/24/2017 Document Reviewed: 10/21/2016 Elsevier Patient Education  2020 [place].   Heart Failure Eating Plan Heart failure, also called congestive heart failure, occurs when your heart does not pump blood well enough to meet your body's needs for oxygen-rich blood. Heart failure is a long-term (chronic) condition. Living with heart failure can be challenging. However, following your health care provider's instructions about a healthy lifestyle and working with a diet and nutrition specialist (dietitian) to choose the right foods may help to improve  your symptoms. What are tips for following this plan? Reading food labels  Check food labels for the amount of sodium per serving. Choose foods that have less than 140 mg (milligrams) of sodium in each serving.  Check food labels for the number of calories per serving. This is important if you need to limit your daily calorie  intake to lose weight.  Check food labels for the serving size. If you eat more than one serving, you will be eating more sodium and calories than what is listed on the label.  Look for foods that are labeled as "sodium-free," "very low sodium," or "low sodium." ? Foods labeled as "reduced sodium" or "lightly salted" may still have more sodium than what is recommended for you. Cooking  Avoid adding salt when cooking. Ask your health care provider or dietitian before using salt substitutes.  Season food with salt-free seasonings, spices, or herbs. Check the label of seasoning mixes to make sure they do not contain salt.  Cook with heart-healthy oils, such as olive, canola, soybean, or sunflower oil.  Do not fry foods. Cook foods using low-fat methods, such as baking, boiling, grilling, and broiling.  Limit unhealthy fats when cooking by: ? Removing the skin from poultry, such as chicken. ? Removing all visible fats from meats. ? Skimming the fat off from stews, soups, and gravies before serving them. Meal planning   Limit your intake of: ? Processed, canned, or pre-packaged foods. ? Foods that are high in trans fat, such as fried foods. ? Sweets, desserts, sugary drinks, and other foods with added sugar. ? Full-fat dairy products, such as whole milk.  Eat a balanced diet that includes: ? 4-5 servings of fruit each day and 4-5 servings of vegetables each day. At each meal, try to fill half of your plate with fruits and vegetables. ? Up to 6-8 servings of whole grains each day. ? Up to 2 servings of lean meat, poultry, or fish each day. One serving of meat is  equal to 3 oz. This is about the same size as a deck of cards. ? 2 servings of low-fat dairy each day. ? Heart-healthy fats. Healthy fats called omega-3 fatty acids are found in foods such as flaxseed and cold-water fish like sardines, salmon, and mackerel.  Aim to eat 25-35 g (grams) of fiber a day. Foods that are high in fiber include apples, broccoli, carrots, beans, peas, and whole grains.  Do not add salt or condiments that contain salt (such as soy sauce) to foods before eating.  When eating at a restaurant, ask that your food be prepared with less salt or no salt, if possible.  Try to eat 2 or more vegetarian meals each week.  Eat more home-cooked food and eat less restaurant, buffet, and fast food. General information  Do not eat more than 2,300 mg of salt (sodium) a day. The amount of sodium that is recommended for you may be lower, depending on your condition.  Maintain a healthy body weight as directed. Ask your health care provider what a healthy weight is for you. ? Check your weight every day. ? Work with your health care provider and dietitian to make a plan that is right for you to lose weight or maintain your current weight.  Limit how much fluid you drink. Ask your health care provider or dietitian how much fluid you can have each day.  Limit or avoid alcohol as told by your health care provider or dietitian. Recommended foods The items listed may not be a complete list. Talk with your dietitian about what dietary choices are best for you. Fruits All fresh, frozen, and canned fruits. Dried fruits, such as raisins, prunes, and cranberries. Vegetables All fresh vegetables. Vegetables that are frozen without sauce or added salt. Low-sodium or sodium-free canned vegetables. Grains Bread with less than  80 mg of sodium per slice. Whole-wheat pasta, quinoa, and brown rice. Oats and oatmeal. Barley. [place]. Grits and cream of wheat. Whole-grain and whole-wheat cold  cereal. Meats and other protein foods Lean cuts of meat. Skinless chicken and [place]. Fish with high omega-3 fatty acids, such as salmon, sardines, and other cold-water fishes. Eggs. Dried beans, peas, and edamame. Unsalted nuts and nut butters. Dairy Low-fat or nonfat (skim) milk and dried milk. Rice milk, soy milk, and almond milk. Low-fat or nonfat yogurt. Small amounts of reduced-sodium block cheese. Low-sodium cottage cheese. Fats and oils Olive, canola, soybean, flaxseed, or sunflower oil. Avocado. Sweets and desserts Apple sauce. Granola bars. Sugar-free pudding and gelatin. Frozen fruit bars. Seasoning and other foods Fresh and dried herbs. Lemon or lime juice. Vinegar. Low-sodium ketchup. Salt-free marinades, salad dressings, sauces, and seasonings. The items listed above may not be a complete list of foods and beverages you can eat. Contact a dietitian for more information. Foods to avoid The items listed may not be a complete list. Talk with your dietitian about what dietary choices are best for you. Fruits Fruits that are dried with sodium-containing preservatives. Vegetables Canned vegetables. Frozen vegetables with sauce or seasonings. Creamed vegetables. [place] fries. Onion rings. Pickled vegetables and sauerkraut. Grains Bread with more than 80 mg of sodium per slice. Hot or cold cereal with more than 140 mg sodium per serving. Salted pretzels and crackers. Pre-packaged breadcrumbs. Bagels, croissants, and biscuits. Meats and other protein foods Ribs and chicken wings. Bacon, ham, pepperoni, bologna, salami, and packaged luncheon meats. Hot dogs, bratwurst, and sausage. Canned meat. Smoked meat and fish. Salted nuts and seeds. Dairy Whole milk, half-and-half, and cream. Buttermilk. Processed cheese, cheese spreads, and cheese curds. Regular cottage cheese. Feta cheese. Shredded cheese. String cheese. Fats and oils Butter, lard, shortening, ghee, and bacon fat. Canned and  packaged gravies. Seasoning and other foods Onion salt, garlic salt, table salt, and sea salt. Marinades. Regular salad dressings. Relishes, pickles, and olives. Meat flavorings and tenderizers, and bouillon cubes. Horseradish, ketchup, and mustard. Worcestershire sauce. Teriyaki sauce, soy sauce (including reduced sodium). Hot sauce and Tabasco sauce. Steak sauce, fish sauce, oyster sauce, and cocktail sauce. Taco seasonings. Barbecue sauce. Tartar sauce. The items listed above may not be a complete list of foods and beverages you should avoid. Contact a dietitian for more information. Summary  A heart failure eating plan includes changes that limit your intake of sodium and unhealthy fat, and it may help you lose weight or maintain a healthy weight. Your health care provider may also recommend limiting how much fluid you drink.  Most people with heart failure should eat no more than 2,300 mg of salt (sodium) a day. The amount of sodium that is recommended for you may be lower, depending on your condition.  Contact your health care provider or dietitian before making any major changes to your diet. This information is not intended to replace advice given to you by your health care provider. Make sure you discuss any questions you have with your health care provider. Document Revised: 11/07/2018 Document Reviewed: 01/26/2017 Elsevier Patient Education  [place] With Heart Failure Caregivers provide physical and emotional support to friends or family members with heart failure. If you are supporting someone who has heart failure, you play an important role in making sure that the person follows schedules, takes medicines, and follows the overall treatment and rehabilitation plan. What do I need to know about heart  failure? Heart failure is a serious condition. It means that the heart cannot fill or pump enough blood and oxygen to support the body and its functions.  This can cause damage to many parts of the body, including important organs.  The person with heart failure may:  Need to take medicines on a regular basis.  Need to have some procedures or surgery.  Require rehabilitation treatment.  Need to make changes in: ? Lifestyle. ? Diet. ? Physical activity and exercise. Planning for discharge from the hospital Privacy laws require that your friend or family member give you permission to hear private health information before this information will be shared with you. Talk with the health care provider about these rules. Before your friend or family member leaves the hospital after treatment, make sure you understand:  The recovery process after heart failure.  Physical, emotional, behavior, and other changes that occur in the person with heart failure.  The medicines that the person with heart failure has to take and when to take them.  Whether you will need extra help at home.  The treatments for heart failure.  Changes you have to make at home to make the person safe.  How to lower the person's risk of having other heart problems.  Diet and exercise changes for the person with heart failure.  Whether the person with heart failure will need help using the bathroom, bathing, eating, or doing other activities.  Whether you need to get certain devices or equipment for the person with heart failure.  That heart failure requires emergency medical care if symptoms appear. Caring for yourself It is normal to have many different emotions while caring for someone with heart failure. The lifestyle changes that come with this diagnosis can be stressful and difficult. As a caregiver, you need to:  Monitor yourself for signs of emotional, physical, and mental exhaustion (caregiver burnout). This can happen if you feel overwhelmed.  Take care of yourself so that you can better meet the needs of your loved one and also meet your own  needs. Signs of caregiver burnout  Sleep problems.  Difficulty concentrating.  Changes in appetite.  Depressed mood and mood swings.  Feeling emotionally out of control.  Feeling unmotivated to do anything.  Neglecting your loved one, including forgetting to give medicines or keep appointments. Ways to care for yourself   Find a local support group.  Ask friends and family for help.  Get counseling or therapy.  Ask a health care provider for help.  Take time for yourself.  Relax, exercise, and eat healthy. What support is available?  Ask your health care provider for help.  Think about taking classes to learn more about supporting someone with heart failure.  Take a class to become certified in CPR.  Think about joining a support group for people who are caring for someone with heart failure. It is possible to find such a group in your local community.  Search for information online from trusted sources such as the American Heart Association: www.heart.org Summary  Before you leave the hospital, make sure you understand how to care for someone with heart failure.  It is normal to have many different emotions while caring for someone with heart failure.  Take care of yourself by asking for help and taking time to relax.  Monitor yourself for caregiver burnout and know when to ask for help. This information is not intended to replace advice given to you by your health care  provider. Make sure you discuss any questions you have with your health care provider. Document Revised: 01/02/2019 Document Reviewed: 01/23/2017 Elsevier Patient Education  2020 [place] With Heart Failure  Heart failure is a long-term (chronic) condition in which the heart cannot pump enough blood through the body. When this happens, parts of the body do not get the blood and oxygen they need. There is no cure for heart failure at this time, so it is important for you to take  good care of yourself and follow the treatment plan set by your health care provider. If you are living with heart failure, there are ways to help you manage the disease. Follow these instructions at home: Living with heart failure requires you to make changes in your life. Your health care team will teach you about the changes you need to make in order to relieve your symptoms and lower your risk of going to the hospital. Follow the treatment plan as set by your health care provider. Medicines Medicines are important in reducing your heart's workload, slowing the progression of heart failure, and improving your symptoms.  Take over-the-counter and prescription medicines only as told by your health care provider.  Do not stop taking your medicine unless your health care provider tells you to do that.  Do not skip any dose of your medicine.  Refill prescriptions before you run out of medicine. You need your medicines every day. Eating and drinking   Eat heart-healthy foods. Talk with a dietitian to make an eating plan that is right for you. ? If directed by your health care provider:  Limit salt (sodium). Lowering your sodium intake may reduce symptoms of heart failure. Ask a dietitian to recommend heart-healthy seasonings.  Limit your fluid intake. Fluid restriction may reduce symptoms of heart failure. ? Use low-fat cooking methods instead of frying. Low-fat methods include roasting, grilling, broiling, baking, poaching, steaming, and stir-frying. ? Choose foods that contain no trans fat and are low in saturated fat and cholesterol. Healthy choices include fresh or frozen fruits and vegetables, fish, lean meats, legumes, fat-free or low-fat dairy products, and whole-grain or high-fiber foods.  Limit alcohol intake to no more than 1 drink a day for nonpregnant women and 2 drinks a day for men. One drink equals 12 oz of beer, 5 oz of wine, or 1 oz of hard liquor. ? Drinking more than that  is harmful to your heart. Tell your health care provider if you drink alcohol several times a week. ? Talk with your health care provider about whether any level of alcohol use is safe for you. Activity   Ask your health care provider about attending cardiac rehabilitation. These programs include aerobic physical activity, which provides many benefits for your heart.  If no cardiac rehabilitation program is available, ask your health care provider what aerobic exercises are safe for you to do. Lifestyle Make the lifestyle changes recommended by your health care provider. In general:  Lose weight if your health care provider tells you to do that. Weight loss may reduce symptoms of heart failure.  Do not use any products that contain nicotine or tobacco, such as cigarettes or e-cigarettes. If you need help quitting, ask your health care provider.  Do not use street (illegal) drugs.  Return to your normal activities as told by your health care provider. Ask your health care provider what activities are safe for you. General instructions   Make sure you weigh yourself every  day to track your weight. Rapid weight gain may indicate an increase in fluid in your body and may increase the workload of your heart. ? Weigh yourself every morning. Do this after you urinate but before you eat breakfast. ? Wear the same type of clothing, without shoes, each time you weigh yourself. ? Weigh yourself on the same scale and in the same spot each time.  Living with chronic heart failure often leads to emotions such as fear, stress, anxiety, and depression. If you feel any of these emotions and need help coping, contact your health care provider. Other ways to get help include: ? Talking to friends and family members about your condition. They can give you support and guidance. Explain your symptoms to them and, if comfortable, invite them to attend appointments or rehabilitation with you. ? Joining a support  group for people with chronic heart failure. Talking with other people who have the same symptoms may give you new ways of coping with your disease and your emotions.  Stay up to date with your shots (vaccines). Staying current on pneumococcal and influenza vaccines is especially important in preventing germs from attacking your airways (respiratory infections).  Keep all follow-up visits as told by your health care provider. This is important. How to recognize changes in your condition You and your family members need to know what changes to watch for in your condition. Watch for the following changes and report them to your health care provider:  Sudden weight gain. Ask your health care provider what amount of weight gain to report.  Shortness of breath: ? Feeling short of breath while at rest, with no exercise or activity that required great effort. ? Feeling breathless with activity.  Swelling of your lower legs or ankles.  Difficulty sleeping: ? You wake up feeling short of breath. ? You have to use more pillows to raise your head in order to sleep.  Frequent, dry, hacking cough.  Loss of appetite.  Feeling more tired all the time.  Depression or feelings of sadness or hopelessness.  Bloating in the stomach. Where to find more information  Local support groups. Ask your health care provider about groups near you.  The American Heart Association: www.heart.org Contact a health care provider if:  You have a rapid weight gain.  You have increasing shortness of breath that is unusual for you.  You are unable to participate in your usual physical activities.  You tire easily.  You cough more than normal, especially with physical activity.  You have any swelling or more swelling in areas such as your hands, feet, ankles, or abdomen.  You feel like your heart is beating quickly (palpitations).  You become dizzy or light-headed when you stand up. Get help right away  if:  You have difficulty breathing.  You notice or your family notices a change in your awareness, such as having trouble staying awake or having difficulty with concentration.  You have pain or discomfort in your chest.  You have an episode of fainting (syncope). Summary  There is no cure for heart failure, so it is important for you to take good care of yourself and follow the treatment plan set by your health care provider.  Medicines are important in reducing your heart's workload, slowing the progression of heart failure, and improving your symptoms.  Living with chronic heart failure often leads to emotions such as fear, stress, anxiety, and depression. If you are feeling any of these emotions and need  help coping, contact your health care provider. This information is not intended to replace advice given to you by your health care provider. Make sure you discuss any questions you have with your health care provider. Document Revised: 08/24/2017 Document Reviewed: 01/24/2017 Elsevier Patient Education  [place].    IMPORTANT INFORMATION: PAY CLOSE ATTENTION   PHYSICIAN DISCHARGE INSTRUCTIONS  Follow with Primary care provider  Fayrene Helper, MD  and other consultants as instructed by your Hospitalist Physician  [place] IF SYMPTOMS COME BACK, WORSEN OR NEW PROBLEM DEVELOPS   Please note: You were cared for by a hospitalist during your hospital stay. Every effort will be made to forward records to your primary care provider.  You can request that your primary care provider send for your hospital records if they have not received them.  Once you are discharged, your primary care physician will handle any further medical issues. Please note that NO REFILLS for any discharge medications will be authorized once you are discharged, as it is imperative that you return to your primary care physician (or establish a relationship with a  primary care physician if you do not have one) for your post hospital discharge needs so that they can reassess your need for medications and monitor your lab values.  Please get a complete blood count and chemistry panel checked by your Primary MD at your next visit, and again as instructed by your Primary MD.  Get Medicines reviewed and adjusted: Please take all your medications with you for your next visit with your Primary MD  Laboratory/radiological data: Please request your Primary MD to go over all hospital tests and procedure/radiological results at the follow up, please ask your primary care provider to get all Hospital records sent to his/her office.  In some cases, they will be blood work, cultures and biopsy results pending at the time of your discharge. Please request that your primary care provider follow up on these results.  If you are diabetic, please bring your blood sugar readings with you to your follow up appointment with primary care.    Please call and make your follow up appointments as soon as possible.    Also Note the following: If you experience worsening of your admission symptoms, develop shortness of breath, life threatening emergency, suicidal or homicidal thoughts you must seek medical attention immediately by calling 911 or calling your MD immediately  if symptoms less severe.  You must read complete instructions/literature along with all the possible adverse reactions/side effects for all the Medicines you take and that have been prescribed to you. Take any new Medicines after you have completely understood and accpet all the possible adverse reactions/side effects.   Do not drive when taking Pain medications or sleeping medications (Benzodiazepines)  Do not take more than prescribed Pain, Sleep and Anxiety Medications. It is not advisable to combine anxiety,sleep and pain medications without talking with your primary care practitioner  Special  Instructions: If you have smoked or chewed Tobacco  in the last 2 yrs please stop smoking, stop any regular Alcohol  and or any Recreational drug use.  Wear Seat belts while driving.  Do not drive if taking any narcotic, mind altering or controlled substances or recreational drugs or alcohol.

## 2020-04-02 NOTE — Discharge Summary (Signed)
Physician Discharge Summary  [name] [name] MVH:[id number] DOB: 03-04-1932 DOA: 03/30/2020  PCP: Fayrene Helper, MD Cardiologist: [place]  Admit date: 03/30/2020 Discharge date: 04/02/2020  Admitted From: Home  Disposition: Home with [facility]   Recommendations for Outpatient Follow-up:  1. Follow up with PCP in 1 weeks 2. Please obtain BMP in one week 3. Home health nurse to check PT/INR on 04/05/20 and report to PCP and cardiology  Home Health: PT, RN   Discharge Condition: STABLE   CODE STATUS: DNR    Brief Hospitalization Summary: Please see all hospital notes, images, labs for full details of the hospitalization. ADMISSION HPI: [name] [name] is a 86 y.o. female with medical history significant for COPD, diastolic CHF, on chronic 2 L of oxygen, complete heart block, AICD/pacemaker status, aortic and mitral valve replacement on chronic anticoagulation, diabetes mellitus. Patient presented to the ED with complaints of bilateral lower extremity swelling over the past 3 to 4 days, she also reports difficulty breathing worse with exertion improved with rest.  She reports compliance with her medications which include Lasix.  No chest pain.  Reports a cough mostly at night, productive of whitish sputum. Patient lives with her nephew who has to go to work.  At baseline patient ambulates with a walker.  ED Course: Blood pressure systolic 841L to 244W, O2 sats 86% on room air, improved to greater than 94% on patient's baseline 2 L of oxygen.  Sodium 130, creatinine 1.5, BNP 502.  Port X-ray without acute abnormality.  In the ED, patient was ambulated, she was unsteady, she ambulated 10 to 12 feet with assist x2, patient's breathing seemed labored.  Lasix 40 mg x 1 given.  Hospitalist to admit for further evaluation and management.    Brief Admission History:  86 y.o.femalewith medical history significant forCOPD, diastolic CHF, on chronic 2 L of oxygen, complete heart block, AICD/pacemaker  status,aortic and mitral valve replacement on chronic anticoagulation, diabetes mellitus.  Patient presented to the ED with complaints of bilateral lower extremity swelling over the past 3 to 4 days, she also reports difficulty breathing worse with exertion improved with rest.    Assessment & Plan:   Principal Problem:   Acute on chronic congestive heart failure (HCC) Active Problems:   Essential hypertension   Hx of aortic valve replacement, mechanical   S/P MVR (mitral valve replacement)   Biventricular automatic implantable cardioverter defibrillator in situ   CKD (chronic kidney disease), stage IV (HCC)   Chronic anticoagulation, on coumadin for Mechanical AVR and MVR   COPD (chronic obstructive pulmonary disease) (HCC)   Persistent atrial fibrillation (HCC)   Chronic systolic congestive heart failure (HCC)   Acute systolic CHF (congestive heart failure) (HCC)   Bilateral lower extremity edema  1. Acute on chronic combined systolic and diastolic CHF - Pt is diuresing well on IV lasix and weight is slowly trending down. She remains over dry weight but much improved.  Renal function is improving. Pt responded well to IV lasix.  She is discharging home on lasix 60 mg daily as her new diuretic dose.  She has close outpatient cardiology follow up arranged.    2. Hyponatremia - improved with diuresis and stable.  Follow outpatient.    3. Chronic hypoxic respiratory failure - stable on chronic supplemental oxygen 2L/min. 4. Essential hypertension - improved with diuresis, resumed home meds.  5. Complete heart block s/p PPM - stable.  Continued home metoprolol.  6. Stage IV CKD - creatinine stable with IV lasix  and diuresis.  7. Mechanical mitral and aortic valve - warfarin subtherapeutic, followed closely, pharmacy dosed warfarin in hospital.  Resuming regular home dose and having [place] RN to check PT/INR on 04/05/20 and report readings to outpatient PCP/cardiology.  8. Persistent atrial  fibrillation - rate controlled and anticoagulated.   9. Anemia of chronic disease - follow HG.  10. Hypothyroidism - resume home levothyroxine dose.  11. Type 2 diabetes mellitus with vascular complications - follow CBG and give SSI coverage. Currently stable and well controlled. resume home regimen at discharge.   DVT prophylaxis: warfarin  Code Status: DNR  Family Communication:  Spoke with niece by telephone update 04/01/20 Disposition: Home with [facility]   Status is: Inpatient  Dispo: The patient is from: Home  Anticipated d/c is to: Home  Anticipated d/c date is: 04/02/20  Patient currently is medically stable to d/c.  Consultants:   Cardiology   Discharge Diagnoses:  Principal Problem:   Acute on chronic congestive heart failure (HCC) Active Problems:   Essential hypertension   Hx of aortic valve replacement, mechanical   S/P MVR (mitral valve replacement)   Biventricular automatic implantable cardioverter defibrillator in situ   CKD (chronic kidney disease), stage IV (HCC)   Chronic anticoagulation, on coumadin for Mechanical AVR and MVR   COPD (chronic obstructive pulmonary disease) (HCC)   Persistent atrial fibrillation (HCC)   Chronic systolic congestive heart failure (HCC)   Acute systolic CHF (congestive heart failure) ([place])   Bilateral lower extremity edema   Discharge Instructions:  Allergies as of 04/02/2020      Reactions   Penicillins Other (See Comments)   Bruise   Aspirin Other (See Comments)   On coumadin   Fentanyl Dermatitis   Rash with patch   Niacin Itching      Medication List    TAKE these medications   acetaminophen 500 MG tablet Commonly known as: TYLENOL Take 500 mg by mouth every 6 (six) hours as needed (pain).   albuterol (2.5 MG/3ML) 0.083% nebulizer solution Commonly known as: PROVENTIL Take 3 mLs (2.5 mg total) by nebulization in the morning and at bedtime. *May use additional one time if needed  for shortness of breath   albuterol 108 (90 Base) MCG/ACT inhaler Commonly known as: VENTOLIN HFA Inhale 1-2 puffs into the lungs every 6 (six) hours as needed for wheezing or shortness of breath.   allopurinol 100 MG tablet Commonly known as: ZYLOPRIM Take 1 tablet (100 mg total) by mouth in the morning. What changed: See the new instructions.   Breo Ellipta 100-25 MCG/INH Aepb Generic drug: fluticasone furoate-vilanterol 1 TAKE 1 INHALATION BY MOUTH ONCE DAILY What changed:   how much to take  how to take this  when to take this  additional instructions   cholecalciferol 25 MCG (1000 UNIT) tablet Commonly known as: VITAMIN D3 Take 1,000 Units by mouth in the morning.   docusate sodium 100 MG capsule Commonly known as: COLACE Take 300 mg by mouth at bedtime.   fluticasone 50 MCG/ACT nasal spray Commonly known as: FLONASE SHAKE LIQUID AND USE 2 SPRAYS IN EACH NOSTRIL DAILY What changed: See the new instructions.   folic acid 1 MG tablet Commonly known as: FOLVITE TAKE 1 TABLET(1 MG) BY MOUTH DAILY What changed: See the new instructions.   furosemide 20 MG tablet Commonly known as: LASIX Take 3 tablets (60 mg total) by mouth daily. What changed:   medication strength  how much to take   gabapentin 300  MG capsule Commonly known as: NEURONTIN Take 1 capsule (300 mg total) by mouth 2 (two) times daily.   hydrALAZINE 25 MG tablet Commonly known as: APRESOLINE Take 1 tablet (25 mg total) by mouth 2 (two) times daily.   isosorbide mononitrate 30 MG 24 hr tablet Commonly known as: IMDUR Take 0.5 tablets (15 mg total) by mouth every morning.   meclizine 12.5 MG tablet Commonly known as: ANTIVERT TAKE 1 TABLET(12.5 MG) BY MOUTH THREE TIMES DAILY AS NEEDED FOR DIZZINESS What changed: See the new instructions.   metolazone 2.5 MG tablet Commonly known as: ZAROXOLYN Take one tablet on Saturdays. What changed:   how much to take  how to take this  when  to take this  additional instructions   metoprolol succinate 100 MG 24 hr tablet Commonly known as: TOPROL-XL TAKE 1 TABLET BY MOUTH EVERY DAY WITH FOOD OR MILK What changed:   how much to take  how to take this  when to take this  additional instructions   nitroGLYCERIN 0.4 MG SL tablet Commonly known as: NITROSTAT Place 1 tablet (0.4 mg total) under the tongue every 5 (five) minutes as needed for chest pain.   ondansetron 4 MG disintegrating tablet Commonly known as: Zofran ODT Take 1 tablet (4 mg total) by mouth every 8 (eight) hours as needed for nausea or vomiting.   polyethylene glycol powder 17 GM/SCOOP powder Commonly known as: GLYCOLAX/MIRALAX Take 17 g by mouth daily as needed for mild constipation or moderate constipation.   potassium chloride SA 20 MEQ tablet Commonly known as: KLOR-CON Take 1 tablet (20 mEq total) by mouth daily. What changed: See the new instructions.   pravastatin 40 MG tablet Commonly known as: PRAVACHOL TAKE 1 TABLET BY MOUTH EVERY EVENING   SALONPAS PAIN RELIEF PATCH EX Apply 1 patch topically daily as needed (pain).   Synthroid 75 MCG tablet Generic drug: levothyroxine Take 1 tablet (75 mcg total) by mouth daily before breakfast.   Systane 0.4-0.3 % Gel ophthalmic gel Generic drug: Polyethyl [name] Place 1 application into the left eye at bedtime.   warfarin 5 MG tablet Commonly known as: Jantoven Take as directed. If you are unsure how to take this medication, talk to your nurse or doctor. Original instructions: Take 1-2 tablets (5-10 mg total) by mouth See admin instructions. TAKE 2 TABLETS EVERY DAY EXCEPT 1 TABLET ON TUESDAYS @ 8:30PM       Follow-up Information    Fayrene Helper, MD. Schedule an appointment as soon as possible for a visit in 1 week(s).   Specialty: Family Medicine Why: See your PCP within 1 week. Will need a Basic Metabolic Panel at that time (blood work).  Contact  information: [address] [place] [address] [phone]        Lendon Colonel, NP Follow up on 04/23/2020.   Specialties: Nurse Practitioner, Radiology, Cardiology Why: Cardiology Hospital Follow-up on 04/23/2020 at 11:15 AM with Jory Sims, NP (works with Dr. Sallyanne Kuster).  Contact information: [place] [id number] [phone]              Allergies  Allergen Reactions  . Penicillins Other (See Comments)    Bruise    . Aspirin Other (See Comments)    On coumadin  . Fentanyl Dermatitis    Rash with patch   . Niacin Itching   Allergies as of 04/02/2020      Reactions   Penicillins Other (  See Comments)   Bruise   Aspirin Other (See Comments)   On coumadin   Fentanyl Dermatitis   Rash with patch   Niacin Itching      Medication List    TAKE these medications   acetaminophen 500 MG tablet Commonly known as: TYLENOL Take 500 mg by mouth every 6 (six) hours as needed (pain).   albuterol (2.5 MG/3ML) 0.083% nebulizer solution Commonly known as: PROVENTIL Take 3 mLs (2.5 mg total) by nebulization in the morning and at bedtime. *May use additional one time if needed for shortness of breath   albuterol 108 (90 Base) MCG/ACT inhaler Commonly known as: VENTOLIN HFA Inhale 1-2 puffs into the lungs every 6 (six) hours as needed for wheezing or shortness of breath.   allopurinol 100 MG tablet Commonly known as: ZYLOPRIM Take 1 tablet (100 mg total) by mouth in the morning. What changed: See the new instructions.   Breo Ellipta 100-25 MCG/INH Aepb Generic drug: fluticasone furoate-vilanterol 1 TAKE 1 INHALATION BY MOUTH ONCE DAILY What changed:   how much to take  how to take this  when to take this  additional instructions   cholecalciferol 25 MCG (1000 UNIT) tablet Commonly known as: VITAMIN D3 Take 1,000 Units by mouth in the morning.   docusate sodium 100 MG capsule Commonly known as:  COLACE Take 300 mg by mouth at bedtime.   fluticasone 50 MCG/ACT nasal spray Commonly known as: FLONASE SHAKE LIQUID AND USE 2 SPRAYS IN EACH NOSTRIL DAILY What changed: See the new instructions.   folic acid 1 MG tablet Commonly known as: FOLVITE TAKE 1 TABLET(1 MG) BY MOUTH DAILY What changed: See the new instructions.   furosemide 20 MG tablet Commonly known as: LASIX Take 3 tablets (60 mg total) by mouth daily. What changed:   medication strength  how much to take   gabapentin 300 MG capsule Commonly known as: NEURONTIN Take 1 capsule (300 mg total) by mouth 2 (two) times daily.   hydrALAZINE 25 MG tablet Commonly known as: APRESOLINE Take 1 tablet (25 mg total) by mouth 2 (two) times daily.   isosorbide mononitrate 30 MG 24 hr tablet Commonly known as: IMDUR Take 0.5 tablets (15 mg total) by mouth every morning.   meclizine 12.5 MG tablet Commonly known as: ANTIVERT TAKE 1 TABLET(12.5 MG) BY MOUTH THREE TIMES DAILY AS NEEDED FOR DIZZINESS What changed: See the new instructions.   metolazone 2.5 MG tablet Commonly known as: ZAROXOLYN Take one tablet on Saturdays. What changed:   how much to take  how to take this  when to take this  additional instructions   metoprolol succinate 100 MG 24 hr tablet Commonly known as: TOPROL-XL TAKE 1 TABLET BY MOUTH EVERY DAY WITH FOOD OR MILK What changed:   how much to take  how to take this  when to take this  additional instructions   nitroGLYCERIN 0.4 MG SL tablet Commonly known as: NITROSTAT Place 1 tablet (0.4 mg total) under the tongue every 5 (five) minutes as needed for chest pain.   ondansetron 4 MG disintegrating tablet Commonly known as: Zofran ODT Take 1 tablet (4 mg total) by mouth every 8 (eight) hours as needed for nausea or vomiting.   polyethylene glycol powder 17 GM/SCOOP powder Commonly known as: GLYCOLAX/MIRALAX Take 17 g by mouth daily as needed for mild constipation or moderate  constipation.   potassium chloride SA 20 MEQ tablet Commonly known as: KLOR-CON Take 1 tablet (20 mEq total)  by mouth daily. What changed: See the new instructions.   pravastatin 40 MG tablet Commonly known as: PRAVACHOL TAKE 1 TABLET BY MOUTH EVERY EVENING   SALONPAS PAIN RELIEF PATCH EX Apply 1 patch topically daily as needed (pain).   Synthroid 75 MCG tablet Generic drug: levothyroxine Take 1 tablet (75 mcg total) by mouth daily before breakfast.   Systane 0.4-0.3 % Gel ophthalmic gel Generic drug: Polyethyl [name] Place 1 application into the left eye at bedtime.   warfarin 5 MG tablet Commonly known as: Jantoven Take as directed. If you are unsure how to take this medication, talk to your nurse or doctor. Original instructions: Take 1-2 tablets (5-10 mg total) by mouth See admin instructions. TAKE 2 TABLETS EVERY DAY EXCEPT 1 TABLET ON TUESDAYS @ 8:30PM      Procedures/Studies: DG Chest Portable 1 View  Result Date: 03/30/2020 CLINICAL DATA:  Shortness of breath and CHF EXAM: PORTABLE CHEST 1 VIEW COMPARISON:  Jan 30, 2020 FINDINGS: Again noted is stable cardiomegaly. Aortic knob calcifications are seen. Overlying median sternotomy wires and prosthetic valves are noted. A left-sided pacemaker noted with the lead tips in the right atrium and right ventricle. Mildly increased interstitial markings are again noted throughout both lungs, likely chronic lung changes. No new airspace consolidation or pleural effusion. Advanced left shoulder osteoarthritis is seen with cystic/erosive type change at the humeral head. IMPRESSION: No active disease.  Unchanged cardiomegaly Electronically Signed   By: Prudencio Pair M.D.   On: 03/30/2020 14:48      Subjective: Pt reports that she is feeling much better and she wants to go home.   Discharge Exam: Vitals:   04/02/20 0630 04/02/20 0827  BP: 135/66   Pulse: 60   Resp: 16   Temp: 98.1 F (36.7 C)   SpO2: 100% 98%    Vitals:   04/01/20 2038 04/02/20 0630 04/02/20 0700 04/02/20 0827  BP: 132/67 135/66    Pulse: (!) 59 60    Resp: 16 16    Temp: (!) 97.4 F (36.3 C) 98.1 F (36.7 C)    TempSrc: Oral Oral    SpO2: 100% 100%  98%  Weight:   87 kg   Height:       General: Pt is alert, awake, not in acute distress Cardiovascular: normal S1/S2 +, no rubs, no gallops Respiratory: CTA bilaterally, no wheezing, no rhonchi Abdominal: Soft, NT, ND, bowel sounds + Extremities: trace pretibial edema, no cyanosis   The results of significant diagnostics from this hospitalization (including imaging, microbiology, ancillary and laboratory) are listed below for reference.     Microbiology: Recent Results (from the past 240 hour(s))  SARS Coronavirus 2 by RT PCR (hospital order, performed in [facility] hospital lab) Nasopharyngeal Nasopharyngeal Swab     Status: None   Collection Time: 03/30/20  7:06 PM   Specimen: Nasopharyngeal Swab  Result Value Ref Range Status   SARS Coronavirus 2 NEGATIVE NEGATIVE Final    Comment: (NOTE) SARS-CoV-2 target nucleic acids are NOT DETECTED.  The SARS-CoV-2 RNA is generally detectable in upper and lower respiratory specimens during the acute phase of infection. The lowest concentration of SARS-CoV-2 viral copies this assay can detect is 250 copies / mL. A negative result does not preclude SARS-CoV-2 infection and should not be used as the sole basis for treatment or other patient management decisions.  A negative result may occur with improper specimen collection / handling, submission of specimen other than nasopharyngeal swab, presence of  viral mutation(s) within the areas targeted by this assay, and inadequate number of viral copies (<250 copies / mL). A negative result must be combined with clinical observations, patient history, and epidemiological information.  Fact Sheet for Patients:   StrictlyIdeas.no  Fact Sheet for Healthcare  Providers: BankingDealers.co.za  This test is not yet approved or  cleared by the [place] FDA and has been authorized for detection and/or diagnosis of SARS-CoV-2 by FDA under an Emergency Use Authorization (EUA).  This EUA will remain in effect (meaning this test can be used) for the duration of the COVID-19 declaration under Section 564(b)(1) of the Act, 21 U.S.C. section 360bbb-3(b)(1), unless the authorization is terminated or revoked sooner.  Performed at [facility], [address], [place], [place] [address]      Labs: BNP (last 3 results) Recent Labs    01/26/20 1918 01/30/20 2340 03/30/20 1202  BNP 489.0* 778.2* 263.7*   Basic Metabolic Panel: Recent Labs  Lab 03/30/20 1202 03/31/20 0442 04/01/20 0502 04/02/20 0420  NA 130* 131* 131* 131*  K 5.4* 5.0 4.3 4.3  CL 90* 91* 88* 87*  CO2 31 32 33* 34*  GLUCOSE 97 97 92 92  BUN 44* 40* 38* 42*  CREATININE 1.50* 1.40* 1.40* 1.40*  CALCIUM 9.4 8.9 8.9 8.7*  MG  --   --  1.9 1.8   Liver Function Tests: Recent Labs  Lab 03/30/20 1202  AST 28  ALT 20  ALKPHOS 51  BILITOT 0.4  PROT 7.5  ALBUMIN 3.8   No results for input(s): LIPASE, AMYLASE in the last 168 hours. No results for input(s): AMMONIA in the last 168 hours. CBC: Recent Labs  Lab 03/30/20 1202 04/01/20 0502 04/02/20 0420  WBC 6.3 5.7 6.0  HGB 10.1* 9.7* 9.8*  HCT 32.8* 31.6* 31.5*  MCV 95.3 93.5 94.0  PLT 208 208 206   Cardiac Enzymes: No results for input(s): CKTOTAL, CKMB, CKMBINDEX, TROPONINI in the last 168 hours. BNP: Invalid input(s): POCBNP CBG: Recent Labs  Lab 04/01/20 1147 04/01/20 1615 04/01/20 2102 04/02/20 0823 04/02/20 1104  GLUCAP 81 60* 100* 93 142*   D-Dimer No results for input(s): DDIMER in the last 72 hours. Hgb A1c No results for input(s): HGBA1C in the last 72 hours. Lipid Profile No results for input(s): CHOL, HDL, LDLCALC, TRIG, CHOLHDL, LDLDIRECT in the last 72  hours. Thyroid function studies No results for input(s): TSH, T4TOTAL, T3FREE, THYROIDAB in the last 72 hours.  Invalid input(s): FREET3 Anemia work up No results for input(s): VITAMINB12, FOLATE, FERRITIN, TIBC, IRON, RETICCTPCT in the last 72 hours. Urinalysis    Component Value Date/Time   COLORURINE YELLOW 01/26/2020 2055   APPEARANCEUR CLEAR 01/26/2020 2055   LABSPEC 1.010 01/26/2020 2055   PHURINE 5.0 01/26/2020 2055   GLUCOSEU NEGATIVE 01/26/2020 2055   GLUCOSEU NEG mg/dL 03/22/2010 2313   HGBUR NEGATIVE 01/26/2020 2055   HGBUR negative 05/27/2010 1122   BILIRUBINUR NEGATIVE 01/26/2020 2055   BILIRUBINUR negative 04/28/2019 1500   BILIRUBINUR neg 03/11/2013 1443   [place] 01/26/2020 2055   PROTEINUR NEGATIVE 01/26/2020 2055   UROBILINOGEN 0.2 04/28/2019 1500   UROBILINOGEN 0.2 03/27/2012 1201   NITRITE NEGATIVE 01/26/2020 2055   LEUKOCYTESUR NEGATIVE 01/26/2020 2055   Sepsis Labs Invalid input(s): PROCALCITONIN,  WBC,  LACTICIDVEN Microbiology Recent Results (from the past 240 hour(s))  SARS Coronavirus 2 by RT PCR (hospital order, performed in [place] hospital lab) Nasopharyngeal Nasopharyngeal Swab     Status: None  Collection Time: 03/30/20  7:06 PM   Specimen: Nasopharyngeal Swab  Result Value Ref Range Status   SARS Coronavirus 2 NEGATIVE NEGATIVE Final    Comment: (NOTE) SARS-CoV-2 target nucleic acids are NOT DETECTED.  The SARS-CoV-2 RNA is generally detectable in upper and lower respiratory specimens during the acute phase of infection. The lowest concentration of SARS-CoV-2 viral copies this assay can detect is 250 copies / mL. A negative result does not preclude SARS-CoV-2 infection and should not be used as the sole basis for treatment or other patient management decisions.  A negative result may occur with improper specimen collection / handling, submission of specimen other than nasopharyngeal swab, presence of viral mutation(s)  within the areas targeted by this assay, and inadequate number of viral copies (<250 copies / mL). A negative result must be combined with clinical observations, patient history, and epidemiological information.  Fact Sheet for Patients:   StrictlyIdeas.no  Fact Sheet for Healthcare Providers: BankingDealers.co.za  This test is not yet approved or  cleared by the [place] FDA and has been authorized for detection and/or diagnosis of SARS-CoV-2 by FDA under an Emergency Use Authorization (EUA).  This EUA will remain in effect (meaning this test can be used) for the duration of the COVID-19 declaration under Section 564(b)(1) of the Act, 21 U.S.C. section 360bbb-3(b)(1), unless the authorization is terminated or revoked sooner.  Performed at [facility], [address], [place], [place] [address]    Time coordinating discharge: 34 minutes   SIGNED:  Irwin Brakeman, MD  [facility] 04/02/2020, 12:24 PM How to contact the [facility] Attending or Consulting provider [place] or covering provider during after hours [place], for this patient?  1. Check the care team in [facility] and look for a) attending/consulting [facility] provider listed and b) the [facility] team listed 2. Log into [email or web address] and use [place]'s universal password to access. If you do not have the password, please contact the hospital operator. 3. Locate the [facility] provider you are looking for under [facility] and page to a number that you can be directly reached. 4. If you still have difficulty reaching the provider, please page the [facility] (Director on Call) for the Hospitalists listed on amion for assistance.

## 2020-04-02 NOTE — Care Management Important Message (Signed)
Important Message  Patient Details  Name: [name] [name] MRN: [id number] Date of Birth: November 09, 1931   Medicare Important Message Given:  Yes     Tommy Medal 04/02/2020, 4:14 PM

## 2020-04-02 NOTE — Progress Notes (Signed)
Physical Therapy Treatment Patient Details Name: [name] [name] MRN: [id number] DOB: 03/28/1932 Today's Date: 04/02/2020    History of Present Illness [name] [name] is a 86 y.o. female with medical history significant for COPD, diastolic CHF, on chronic 2 L of oxygen, complete heart block, AICD/pacemaker status, aortic and mitral valve replacement on chronic anticoagulation, diabetes mellitus.Patient presented to the ED with complaints of bilateral lower extremity swelling over the past 3 to 4 days, she also reports difficulty breathing worse with exertion improved with rest.  She reports compliance with her medications which include Lasix.  No chest pain.  Reports a cough mostly at night, productive of whitish sputum.Patient lives with her nephew who has to go to work.  At baseline patient ambulates with a walker.    PT Comments    Patient demonstrates increased endurance/distance for gait training without loss of balance, tends to lean over RW for support secondary to kyphotic trunk, limited due to fatigue and tolerated sitting up in chair after therapy.  Patient requested to attempt gait training while on room air with SpO2 dropping from 93% to 84%, had to put back on 2.5 LPM when returned to room.  Patient will benefit from continued physical therapy in hospital and recommended venue below to increase strength, balance, endurance for safe ADLs and gait.   Follow Up Recommendations  Home health PT;Supervision for mobility/OOB;Supervision - Intermittent     Equipment Recommendations  None recommended by PT    Recommendations for Other Services       Precautions / Restrictions Precautions Precautions: Fall Restrictions Weight Bearing Restrictions: No    Mobility  Bed Mobility Overal bed mobility: Modified Independent                Transfers Overall transfer level: Needs assistance Equipment used: Rolling walker (2 wheeled) Transfers: Sit to/from [employer]  Transfers Sit to Stand: Supervision;Min guard Stand pivot transfers: Supervision;Min guard       General transfer comment: good return for completing sit to stands from bedside and chair, had diffiuclty sit to standing from commode in bathroom to very low requiring Min asisst  Ambulation/Gait Ambulation/Gait assistance: Supervision;Min guard Gait Distance (Feet): 70 Feet Assistive device: Rolling walker (2 wheeled) Gait Pattern/deviations: Decreased step length - right;Decreased step length - left;Decreased stride length;Trunk flexed Gait velocity: decreased   General Gait Details: increased endurance/distance for ambulation with kyphotic posture, no loss of balance, limited secondary to fatigue   Stairs             Wheelchair Mobility    Modified Rankin (Stroke Patients Only)       Balance Overall balance assessment: Needs assistance Sitting-balance support: Feet supported;No upper extremity supported Sitting balance-Leahy Scale: Good Sitting balance - Comments: seated at EOB   Standing balance support: During functional activity;Single extremity supported Standing balance-Leahy Scale: Fair Standing balance comment: fair/good using RW                            Cognition Arousal/Alertness: Awake/alert Behavior During Therapy: WFL for tasks assessed/performed Overall Cognitive Status: Within Functional Limits for tasks assessed                                        Exercises General Exercises - Lower Extremity Long Arc Quad: Seated;AROM;Strengthening;Both;10 reps Hip Flexion/Marching: Seated;AROM;Strengthening;Both;10 reps Toe Raises: Seated;AROM;Strengthening;Both;10 reps Heel Raises: Seated;AROM;Strengthening;Both;10  reps    General Comments        Pertinent Vitals/Pain Pain Assessment: 0-10 Pain Score: 8  Pain Location: right great toe nail Pain Descriptors / Indicators: Sore Pain Intervention(s): Limited activity  within patient's tolerance;Monitored during session    Home Living                      Prior Function            PT Goals (current goals can now be found in the care plan section) Acute Rehab PT Goals Patient Stated Goal: return home with family, home aides to assist PT Goal Formulation: With patient Time For Goal Achievement: 04/05/20 Potential to Achieve Goals: Good Progress towards PT goals: Progressing toward goals    Frequency    Min 3X/week      PT Plan Current plan remains appropriate    Co-evaluation              AM-PAC PT "6 Clicks" Mobility   Outcome Measure  Help needed turning from your back to your side while in a flat bed without using bedrails?: None Help needed moving from lying on your back to sitting on the side of a flat bed without using bedrails?: None Help needed moving to and from a bed to a chair (including a wheelchair)?: A Little Help needed standing up from a chair using your arms (e.g., wheelchair or bedside chair)?: A Little Help needed to walk in hospital room?: A Little Help needed climbing 3-5 steps with a railing? : A Lot 6 Click Score: 19    End of Session Equipment Utilized During Treatment: Oxygen Activity Tolerance: Patient tolerated treatment well;Patient limited by fatigue Patient left: in chair;with call bell/phone within reach Nurse Communication: Mobility status PT Visit Diagnosis: Unsteadiness on feet (R26.81);Other abnormalities of gait and mobility (R26.89);Muscle weakness (generalized) (M62.81)     Time: 5465-0354 PT Time Calculation (min) (ACUTE ONLY): 35 min  Charges:  $Gait Training: 8-22 mins $Therapeutic Exercise: 8-22 mins                     11:47 AM, 04/02/20 Lonell Grandchild, MPT Physical Therapist with [facility] [phone] office [phone] mobile phone

## 2020-04-02 NOTE — TOC Transition Note (Signed)
Transition of Care [facility]) - CM/SW Discharge Note  Patient Details  Name: [name] [name] MRN: [id number] Date of Birth: 02-03-1932  Transition of [facility] CM/SW Contact:  Sherie Don, LCSW Phone Number: 04/02/2020, 1:38 PM  Clinical Narrative: Patient is discharging and [place] orders have been placed. CSW updated Vaughan Basta with [facility] of orders. TOC signing off.  Final next level of care: [place] Barriers to Discharge: Barriers Resolved  Patient Goals and CMS Choice Patient states their goals for this hospitalization and ongoing recovery are:: Discharge home with [facility] CMS Medicare.gov Compare Post Acute Care list provided to:: Patient Choice offered to / list presented to : Patient  Discharge Plan and Services In-house Referral: Clinical Social Work Post Acute Care Choice: Resumption of Svcs/PTA Provider          DME Arranged: N/A DME Agency: NA HH Arranged: PT, RN [place] Agency: [facility]) Date [place]: 04/02/20 Time [place]: [phone]  Readmission Risk Interventions Readmission Risk Prevention Plan 04/01/2020 04/01/2020 02/03/2020  Transportation Screening Complete Complete Complete  PCP or Specialist Appt within 3-5 Days - - -  [place]RI or [place] for [place] - - -  Medication Review (RN Care Manager) Complete - Referral to Pharmacy  PCP or Specialist appointment within 3-5 days of discharge - - Complete  HRI or Home Care Consult Complete Complete Complete  SW Recovery Care/Counseling Consult Complete - Complete  Palliative Care Screening Not Applicable Not Applicable Not [place] Not Applicable Not Applicable Not Applicable  Some recent data might be hidden

## 2020-04-02 NOTE — Progress Notes (Signed)
Ty. BP did not populate note. I always hit the refresh button before signing note

## 2020-04-02 NOTE — Progress Notes (Addendum)
Progress Note  Patient Name: [name] [name] Date of Encounter: 04/02/2020  Primary Cardiologist: Sanda Klein, MD   Subjective   Breathing back to baseline. No chest pain or palpitations. Anxious to go home.   Inpatient Medications    Scheduled Meds: . allopurinol  100 mg Oral Daily  . fluticasone  2 spray Each Nare q morning - 10a  . fluticasone furoate-vilanterol  1 puff Inhalation Daily  . furosemide  40 mg Intravenous BID  . gabapentin  300 mg Oral BID  . hydrALAZINE  25 mg Oral BID  . isosorbide mononitrate  15 mg Oral q morning - 10a  . levothyroxine  75 mcg Oral Q0600  . metoprolol succinate  100 mg Oral QPM  . Warfarin - Pharmacist Dosing Inpatient   Does not apply q1600    PRN Meds: acetaminophen **OR** acetaminophen, albuterol, hydrALAZINE, ondansetron **OR** ondansetron (ZOFRAN) IV, polyethylene glycol, sodium phosphate   Vital Signs    Vitals:   04/01/20 1916 04/01/20 2038 04/02/20 0630 04/02/20 0700  BP: (!) 143/67 132/67 135/66   Pulse: (!) 59 (!) 59 60   Resp:  16 16   Temp:  (!) 97.4 F (36.3 C) 98.1 F (36.7 C)   TempSrc:  Oral Oral   SpO2:  100% 100%   Weight:    87 kg  Height:        Intake/Output Summary (Last 24 hours) at 04/02/2020 0753 Last data filed at 04/02/2020 0646 Gross per 24 hour  Intake 480 ml  Output 1500 ml  Net -1020 ml    Last 3 Weights 04/02/2020 04/01/2020 03/31/2020  Weight (lbs) 191 lb 12.8 oz 190 lb 0.6 oz 194 lb 14.2 oz  Weight (kg) 87 kg 86.2 kg 88.4 kg      Telemetry    V-paced, HR in 60's. - Personally Reviewed  ECG    No new tracings.   Physical Exam   General: Well developed, well nourished, female appearing in no acute distress. Head: Normocephalic, atraumatic.  Neck: Supple without bruits, JVD not elevated. Lungs:  Resp regular and unlabored, occasional crackles. Heart: Irregularly irregular, S1, S2, no S3, S4, or murmur; no rub. Abdomen: Soft, non-tender, non-distended with normoactive bowel sounds.  No hepatomegaly. No rebound/guarding. No obvious abdominal masses. Extremities: No clubbing or cyanosis, trace lower extremity edema. Distal pedal pulses are 2+ bilaterally. Neuro: Alert and oriented X 3. Moves all extremities spontaneously. Psych: Normal affect.  Labs    Chemistry Recent Labs  Lab 03/30/20 1202 03/30/20 1202 03/31/20 0442 04/01/20 0502 04/02/20 0420  NA 130*   < > 131* 131* 131*  K 5.4*   < > 5.0 4.3 4.3  CL 90*   < > 91* 88* 87*  CO2 31   < > 32 33* 34*  GLUCOSE 97   < > 97 92 92  BUN 44*   < > 40* 38* 42*  CREATININE 1.50*   < > 1.40* 1.40* 1.40*  CALCIUM 9.4   < > 8.9 8.9 8.7*  PROT 7.5  --   --   --   --   ALBUMIN 3.8  --   --   --   --   AST 28  --   --   --   --   ALT 20  --   --   --   --   ALKPHOS 51  --   --   --   --   BILITOT 0.4  --   --   --   --  GFRNONAA 31*   < > 34* 34* 34*  GFRAA 36*   < > 39* 39* 39*  ANIONGAP 9   < > 8 10 10    < > = values in this interval not displayed.     Hematology Recent Labs  Lab 03/30/20 1202 04/01/20 0502 04/02/20 0420  WBC 6.3 5.7 6.0  RBC 3.44* 3.38* 3.35*  HGB 10.1* 9.7* 9.8*  HCT 32.8* 31.6* 31.5*  MCV 95.3 93.5 94.0  MCH 29.4 28.7 29.3  MCHC 30.8 30.7 31.1  RDW 17.2* 16.7* 16.8*  PLT 208 208 206    BNP Recent Labs  Lab 03/30/20 1202  BNP 502.0*     Radiology    No results found.  Cardiac Studies   Echocardiogram: 01/2020 IMPRESSIONS   1. Left ventricular ejection fraction, by estimation, is 30 to 35%. The  left ventricle has moderately decreased function. The left ventricle  demonstrates global hypokinesis. There is mild concentric left ventricular  hypertrophy. Left ventricular  diastolic function could not be evaluated.  2. Right ventricular systolic function is normal. The right ventricular  size is normal. There is severely elevated pulmonary artery systolic  pressure. The estimated right ventricular systolic pressure is 34.1 mmHg.  3. Left atrial size was severely  dilated.  4. Right atrial size was moderately dilated.  5. The mitral valve has been repaired/replaced. No evidence of mitral  valve regurgitation.  6. The aortic valve has been repaired/replaced. Aortic valve  regurgitation is not visualized.   Patient Profile     86 y.o. female w/ PMH of CHB (s/p Medtronic BiV PPMin 01/2019), persistent atrial fibrillation, chronic combined systolic and diastolic CHF (EF previously as low as 20-25%, normalized by repeat echo in 2019 and 02/2019, EF 30-35% by echocardiogram in 01/2020), mechanical AVR in 2001 and mechanical MVR in 2004, HTN, HLD, Type 2 DM and Stage 3 CKD who is currently admitted for an acute CHF exacerbation.   Assessment & Plan    1. Acute on Chronic Combined Systolic and Diastolic CHF -Her EF has been variable as this was previously 20-25% but normalized in 2019 and 2020, again reduced to 30-35% in 01/2020. Her device was reprogrammed as it was not felt to be capturing appropriately and this could have contributed to her recurrent cardiomyopathy.  - She has responded well to IV Lasix with a recorded net output of -5.8L thus far and weight has declined from 197 lbs to 190 lbs as of 7/8. Weight recorded as 191 lbs this AM and I doubt the accuracy of this. Will ask for a standing weight prior to discharge.  - Was on Lasix 40mg  daily and Metolazone once weekly prior to admission. Would anticipate titrating her home dose to 60mg  daily. Will arrange close follow-up and for repeat BMET and close evaluation as she may require further titration to 40mg  BID.  - She remains on Toprol-XL, Imdur and Hydralazine for her cardiomyopathy. She was not on ACE-I, ARB or ARNI prior to admission given her variable renal function.   2.CHB -She had a Medtronic BiV PPM which was placed in 01/2019 and is followed by Dr. Lovena Le. She has been V-paced this admission by review of telemetry.   3. PersistentAtrial Fibrillation/Flutter -HR remains  well-controlled in the 60's. She has been continued on Toprol-XL 100mg  daily. She denies any evidence of active bleeding and remains on Coumadin for anticoagulation. She does have a chronic anemia (Hgb 9.8 this AM) which is close to her baseline and she  denies any evidence of active bleeding.    4. History of Mechanical AVR in 2001 andMechanical MVR in 2004 -Echocardiogram in 01/2020 showed normal valve function. Remains on Coumadin for anticoagulation. INR trending up to 1.7 this AM.   5. Stage 3 CKD - Creatinine was elevated to 1.50 on admission, stable at 1.40 over the past several days which is close to her baseline. K+ 4.3. She remains hyponatremic but this has been stable at 131.   For questions or updates, please contact [place] Please consult [email or web address] for contact info under Cardiology/STEMI.   Arna Medici , PA-C 7:53 AM 04/02/2020 Pager: [phone]    Attending note:  Patient eager to go home today. She has diuresed further on IV Lasix (total 5.8 L net output) and feels better. Creatinine stable at 1.4 and potassium 4.3. Discussed with Ms. Delano Metz. Would discharge on Lasix 60 mg daily, no other changes in remaining cardiac regimen. We will get her a follow-up visit with BMET. If weight creeps back up, may need to increase Lasix to 40 mg BID.  [facility] will sign off.    Satira Sark, M.D., F.A.C.C.

## 2020-04-02 NOTE — Progress Notes (Signed)
NURSING PROGRESS NOTE  [name] [name] [id number] Discharge Data: 04/02/2020 3:04 PM Attending Provider: Murlean Iba, MD VEH:[id number], Norwood Levo, MD     [name] to be D/C'd Home per MD order.  Discussed with the patient the After Visit Summary and all questions fully answered. All IV's discontinued with no bleeding noted. All belongings returned to patient for patient to take home.   Last Vital Signs:  Blood pressure 112/60, pulse (!) 59, temperature 99.1 F (37.3 C), temperature source Oral, resp. rate 17, height 5\' 7"  (1.702 m), weight 87 kg, SpO2 96 %.  Discharge Medication List Allergies as of 04/02/2020      Reactions   Penicillins Other (See Comments)   Bruise   Aspirin Other (See Comments)   On coumadin   Fentanyl Dermatitis   Rash with patch   Niacin Itching      Medication List    TAKE these medications   acetaminophen 500 MG tablet Commonly known as: TYLENOL Take 500 mg by mouth every 6 (six) hours as needed (pain).   albuterol (2.5 MG/3ML) 0.083% nebulizer solution Commonly known as: PROVENTIL Take 3 mLs (2.5 mg total) by nebulization in the morning and at bedtime. *May use additional one time if needed for shortness of breath   albuterol 108 (90 Base) MCG/ACT inhaler Commonly known as: VENTOLIN HFA Inhale 1-2 puffs into the lungs every 6 (six) hours as needed for wheezing or shortness of breath.   allopurinol 100 MG tablet Commonly known as: ZYLOPRIM Take 1 tablet (100 mg total) by mouth in the morning. What changed: See the new instructions. Notes to patient: Next dose due tomorrow morning   Breo Ellipta 100-25 MCG/INH Aepb Generic drug: fluticasone furoate-vilanterol 1 TAKE 1 INHALATION BY MOUTH ONCE DAILY What changed:   how much to take  how to take this  when to take this  additional instructions Notes to patient: Next dose due tomorrow morning   cholecalciferol 25 MCG (1000 UNIT) tablet Commonly known as: VITAMIN D3 Take 1,000  Units by mouth in the morning.   docusate sodium 100 MG capsule Commonly known as: COLACE Take 300 mg by mouth at bedtime.   fluticasone 50 MCG/ACT nasal spray Commonly known as: FLONASE SHAKE LIQUID AND USE 2 SPRAYS IN EACH NOSTRIL DAILY What changed: See the new instructions. Notes to patient: Next dose due tomorrow morning   folic acid 1 MG tablet Commonly known as: FOLVITE TAKE 1 TABLET(1 MG) BY MOUTH DAILY What changed: See the new instructions.   furosemide 20 MG tablet Commonly known as: LASIX Take 3 tablets (60 mg total) by mouth daily. What changed:   medication strength  how much to take Notes to patient: Next dose due tomorrow   gabapentin 300 MG capsule Commonly known as: NEURONTIN Take 1 capsule (300 mg total) by mouth 2 (two) times daily. Notes to patient: Next dose due this evening   hydrALAZINE 25 MG tablet Commonly known as: APRESOLINE Take 1 tablet (25 mg total) by mouth 2 (two) times daily. Notes to patient: Next dose due this evening   isosorbide mononitrate 30 MG 24 hr tablet Commonly known as: IMDUR Take 0.5 tablets (15 mg total) by mouth every morning. Notes to patient: Next dose due tomorrow morning   meclizine 12.5 MG tablet Commonly known as: ANTIVERT TAKE 1 TABLET(12.5 MG) BY MOUTH THREE TIMES DAILY AS NEEDED FOR DIZZINESS What changed: See the new instructions.   metolazone 2.5 MG tablet Commonly known as: ZAROXOLYN Take one  tablet on Saturdays. What changed:   how much to take  how to take this  when to take this  additional instructions   metoprolol succinate 100 MG 24 hr tablet Commonly known as: TOPROL-XL TAKE 1 TABLET BY MOUTH EVERY DAY WITH FOOD OR MILK What changed:   how much to take  how to take this  when to take this  additional instructions Notes to patient: Next dose due tomorrow morning   nitroGLYCERIN 0.4 MG SL tablet Commonly known as: NITROSTAT Place 1 tablet (0.4 mg total) under the tongue every  5 (five) minutes as needed for chest pain.   ondansetron 4 MG disintegrating tablet Commonly known as: Zofran ODT Take 1 tablet (4 mg total) by mouth every 8 (eight) hours as needed for nausea or vomiting.   polyethylene glycol powder 17 GM/SCOOP powder Commonly known as: GLYCOLAX/MIRALAX Take 17 g by mouth daily as needed for mild constipation or moderate constipation.   potassium chloride SA 20 MEQ tablet Commonly known as: KLOR-CON Take 1 tablet (20 mEq total) by mouth daily. What changed: See the new instructions. Notes to patient: Next dose due tomorrow morning   pravastatin 40 MG tablet Commonly known as: PRAVACHOL TAKE 1 TABLET BY MOUTH EVERY EVENING Notes to patient: Next dose due this evening   SALONPAS PAIN RELIEF PATCH EX Apply 1 patch topically daily as needed (pain).   Synthroid 75 MCG tablet Generic drug: levothyroxine Take 1 tablet (75 mcg total) by mouth daily before breakfast. Notes to patient: Next dose due tomorrow morning   Systane 0.4-0.3 % Gel ophthalmic gel Generic drug: Polyethyl [name] Place 1 application into the left eye at bedtime.   warfarin 5 MG tablet Commonly known as: Jantoven Take as directed. If you are unsure how to take this medication, talk to your nurse or doctor. Original instructions: Take 1-2 tablets (5-10 mg total) by mouth See admin instructions. TAKE 2 TABLETS EVERY DAY EXCEPT 1 TABLET ON TUESDAYS @ 8:30PM Notes to patient: Next dose due tonight at 8:30 pm        Doristine Devoid, RN

## 2020-04-03 DIAGNOSIS — I130 Hypertensive heart and chronic kidney disease with heart failure and stage 1 through stage 4 chronic kidney disease, or unspecified chronic kidney disease: Secondary | ICD-10-CM | POA: Diagnosis not present

## 2020-04-03 DIAGNOSIS — R2681 Unsteadiness on feet: Secondary | ICD-10-CM | POA: Diagnosis not present

## 2020-04-03 DIAGNOSIS — I5032 Chronic diastolic (congestive) heart failure: Secondary | ICD-10-CM | POA: Diagnosis not present

## 2020-04-03 DIAGNOSIS — M1711 Unilateral primary osteoarthritis, right knee: Secondary | ICD-10-CM | POA: Diagnosis not present

## 2020-04-03 DIAGNOSIS — J9621 Acute and chronic respiratory failure with hypoxia: Secondary | ICD-10-CM | POA: Diagnosis not present

## 2020-04-03 DIAGNOSIS — J449 Chronic obstructive pulmonary disease, unspecified: Secondary | ICD-10-CM | POA: Diagnosis not present

## 2020-04-04 DIAGNOSIS — M1711 Unilateral primary osteoarthritis, right knee: Secondary | ICD-10-CM | POA: Diagnosis not present

## 2020-04-04 DIAGNOSIS — M48061 Spinal stenosis, lumbar region without neurogenic claudication: Secondary | ICD-10-CM | POA: Diagnosis not present

## 2020-04-04 DIAGNOSIS — G4700 Insomnia, unspecified: Secondary | ICD-10-CM | POA: Diagnosis not present

## 2020-04-04 DIAGNOSIS — M47816 Spondylosis without myelopathy or radiculopathy, lumbar region: Secondary | ICD-10-CM | POA: Diagnosis not present

## 2020-04-04 DIAGNOSIS — I428 Other cardiomyopathies: Secondary | ICD-10-CM | POA: Diagnosis not present

## 2020-04-04 DIAGNOSIS — I4819 Other persistent atrial fibrillation: Secondary | ICD-10-CM | POA: Diagnosis not present

## 2020-04-04 DIAGNOSIS — M4310 Spondylolisthesis, site unspecified: Secondary | ICD-10-CM | POA: Diagnosis not present

## 2020-04-04 DIAGNOSIS — H409 Unspecified glaucoma: Secondary | ICD-10-CM | POA: Diagnosis not present

## 2020-04-04 DIAGNOSIS — I130 Hypertensive heart and chronic kidney disease with heart failure and stage 1 through stage 4 chronic kidney disease, or unspecified chronic kidney disease: Secondary | ICD-10-CM | POA: Diagnosis not present

## 2020-04-04 DIAGNOSIS — E890 Postprocedural hypothyroidism: Secondary | ICD-10-CM | POA: Diagnosis not present

## 2020-04-04 DIAGNOSIS — E7849 Other hyperlipidemia: Secondary | ICD-10-CM | POA: Diagnosis not present

## 2020-04-04 DIAGNOSIS — M19012 Primary osteoarthritis, left shoulder: Secondary | ICD-10-CM | POA: Diagnosis not present

## 2020-04-04 DIAGNOSIS — H269 Unspecified cataract: Secondary | ICD-10-CM | POA: Diagnosis not present

## 2020-04-04 DIAGNOSIS — E1136 Type 2 diabetes mellitus with diabetic cataract: Secondary | ICD-10-CM | POA: Diagnosis not present

## 2020-04-04 DIAGNOSIS — I0981 Rheumatic heart failure: Secondary | ICD-10-CM | POA: Diagnosis not present

## 2020-04-04 DIAGNOSIS — J449 Chronic obstructive pulmonary disease, unspecified: Secondary | ICD-10-CM | POA: Diagnosis not present

## 2020-04-04 DIAGNOSIS — D631 Anemia in chronic kidney disease: Secondary | ICD-10-CM | POA: Diagnosis not present

## 2020-04-04 DIAGNOSIS — J9611 Chronic respiratory failure with hypoxia: Secondary | ICD-10-CM | POA: Diagnosis not present

## 2020-04-04 DIAGNOSIS — M1030 Gout due to renal impairment, unspecified site: Secondary | ICD-10-CM | POA: Diagnosis not present

## 2020-04-04 DIAGNOSIS — I5043 Acute on chronic combined systolic (congestive) and diastolic (congestive) heart failure: Secondary | ICD-10-CM | POA: Diagnosis not present

## 2020-04-04 DIAGNOSIS — I83893 Varicose veins of bilateral lower extremities with other complications: Secondary | ICD-10-CM | POA: Diagnosis not present

## 2020-04-04 DIAGNOSIS — K219 Gastro-esophageal reflux disease without esophagitis: Secondary | ICD-10-CM | POA: Diagnosis not present

## 2020-04-04 DIAGNOSIS — I442 Atrioventricular block, complete: Secondary | ICD-10-CM | POA: Diagnosis not present

## 2020-04-04 DIAGNOSIS — N184 Chronic kidney disease, stage 4 (severe): Secondary | ICD-10-CM | POA: Diagnosis not present

## 2020-04-04 DIAGNOSIS — E1122 Type 2 diabetes mellitus with diabetic chronic kidney disease: Secondary | ICD-10-CM | POA: Diagnosis not present

## 2020-04-05 ENCOUNTER — Ambulatory Visit (INDEPENDENT_AMBULATORY_CARE_PROVIDER_SITE_OTHER): Payer: Self-pay | Admitting: *Deleted

## 2020-04-05 ENCOUNTER — Ambulatory Visit: Payer: Medicare Other | Admitting: Physician Assistant

## 2020-04-05 ENCOUNTER — Telehealth: Payer: Self-pay | Admitting: *Deleted

## 2020-04-05 ENCOUNTER — Other Ambulatory Visit: Payer: Self-pay | Admitting: *Deleted

## 2020-04-05 DIAGNOSIS — E1122 Type 2 diabetes mellitus with diabetic chronic kidney disease: Secondary | ICD-10-CM | POA: Diagnosis not present

## 2020-04-05 DIAGNOSIS — Z952 Presence of prosthetic heart valve: Secondary | ICD-10-CM

## 2020-04-05 DIAGNOSIS — D631 Anemia in chronic kidney disease: Secondary | ICD-10-CM | POA: Diagnosis not present

## 2020-04-05 DIAGNOSIS — I130 Hypertensive heart and chronic kidney disease with heart failure and stage 1 through stage 4 chronic kidney disease, or unspecified chronic kidney disease: Secondary | ICD-10-CM | POA: Diagnosis not present

## 2020-04-05 DIAGNOSIS — N184 Chronic kidney disease, stage 4 (severe): Secondary | ICD-10-CM | POA: Diagnosis not present

## 2020-04-05 DIAGNOSIS — I5043 Acute on chronic combined systolic (congestive) and diastolic (congestive) heart failure: Secondary | ICD-10-CM | POA: Diagnosis not present

## 2020-04-05 DIAGNOSIS — Z5181 Encounter for therapeutic drug level monitoring: Secondary | ICD-10-CM

## 2020-04-05 DIAGNOSIS — I0981 Rheumatic heart failure: Secondary | ICD-10-CM | POA: Diagnosis not present

## 2020-04-05 LAB — POCT INR: INR: 2 (ref 2.0–3.0)

## 2020-04-05 IMAGING — DX DG CHEST 2V
2 series · 2 of 2 positions shown · non-contrast
Comparison: Chest radiograph 03/20/2019

CLINICAL DATA: Patient with history of CHF.

EXAM:
CHEST - 2 VIEW

[chest lat]
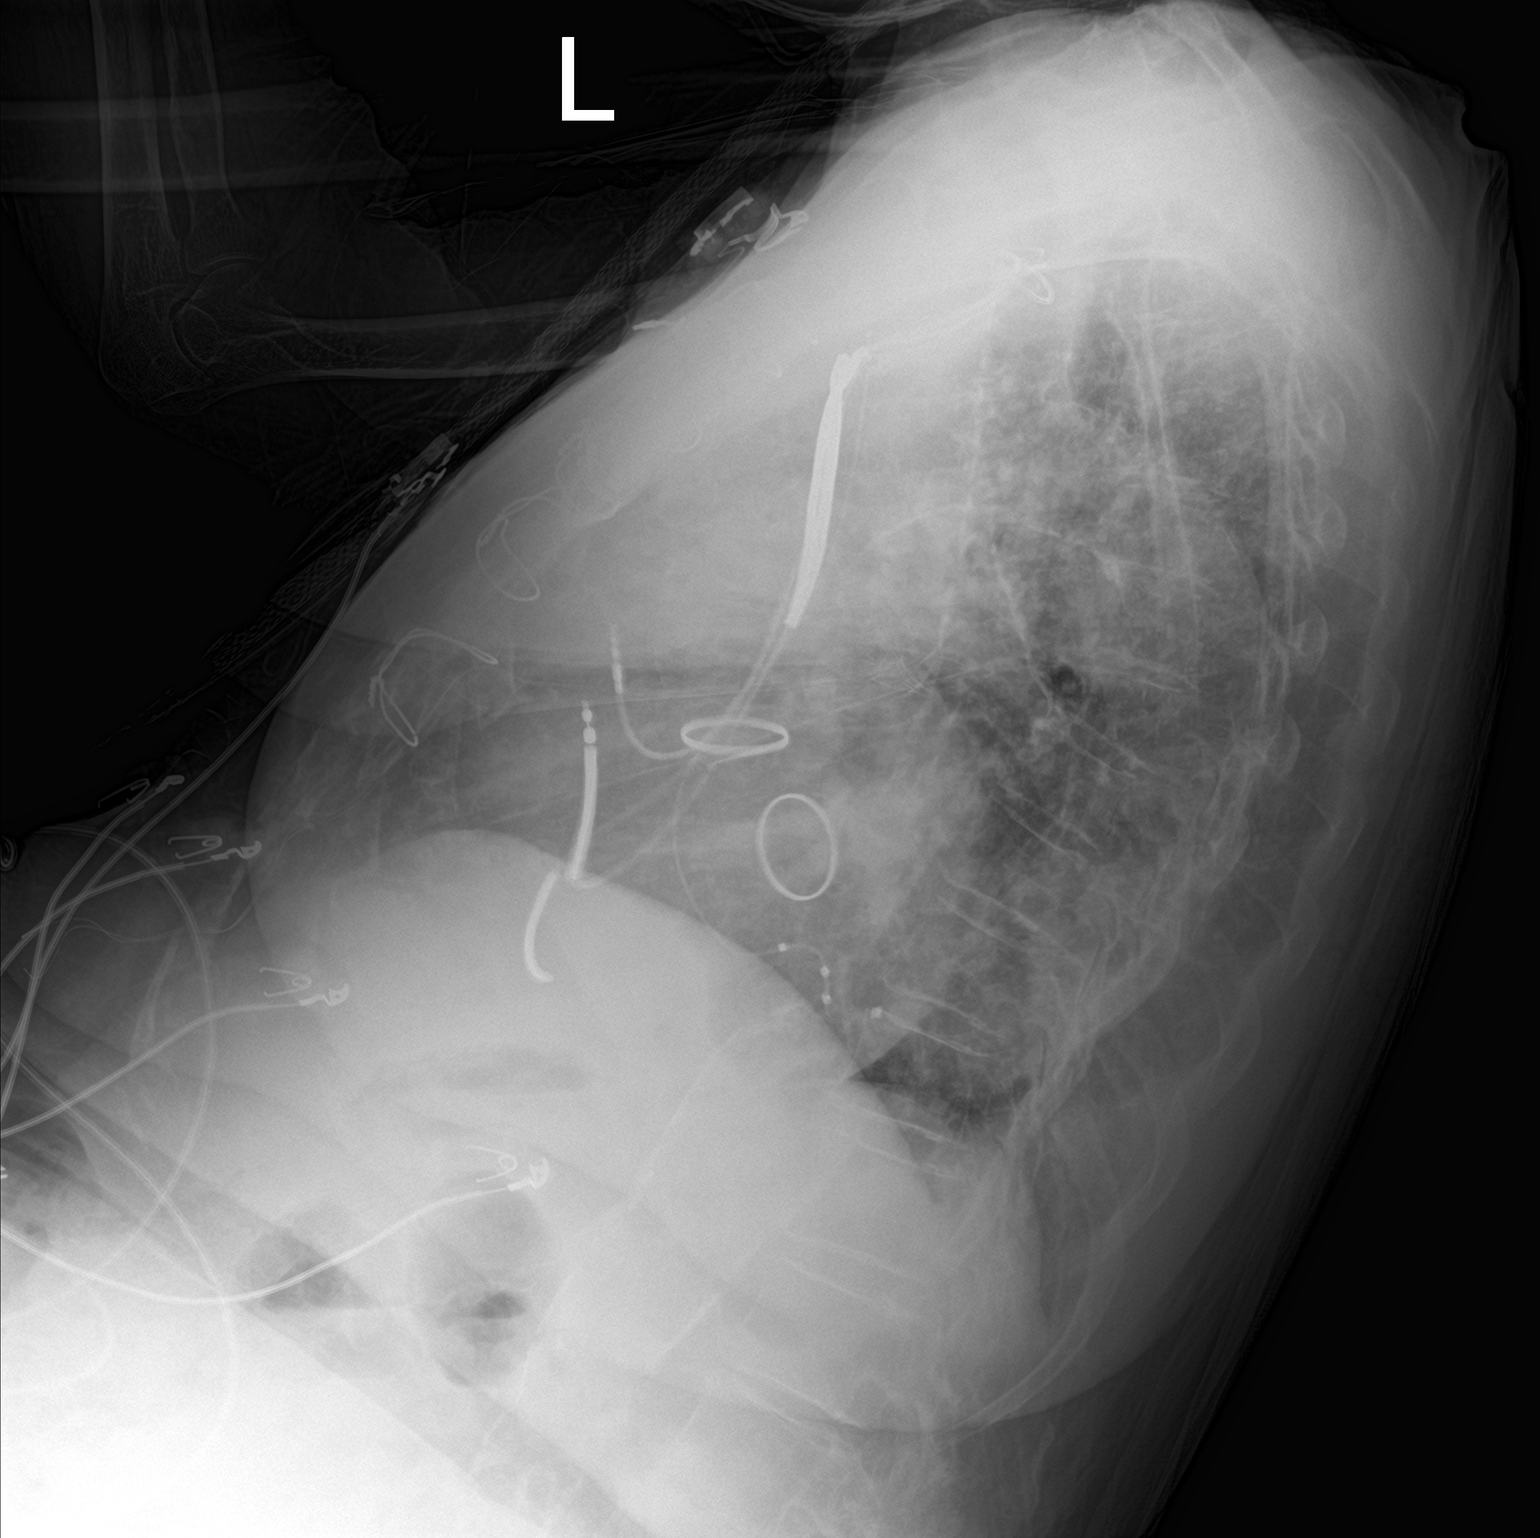

[chest ap]
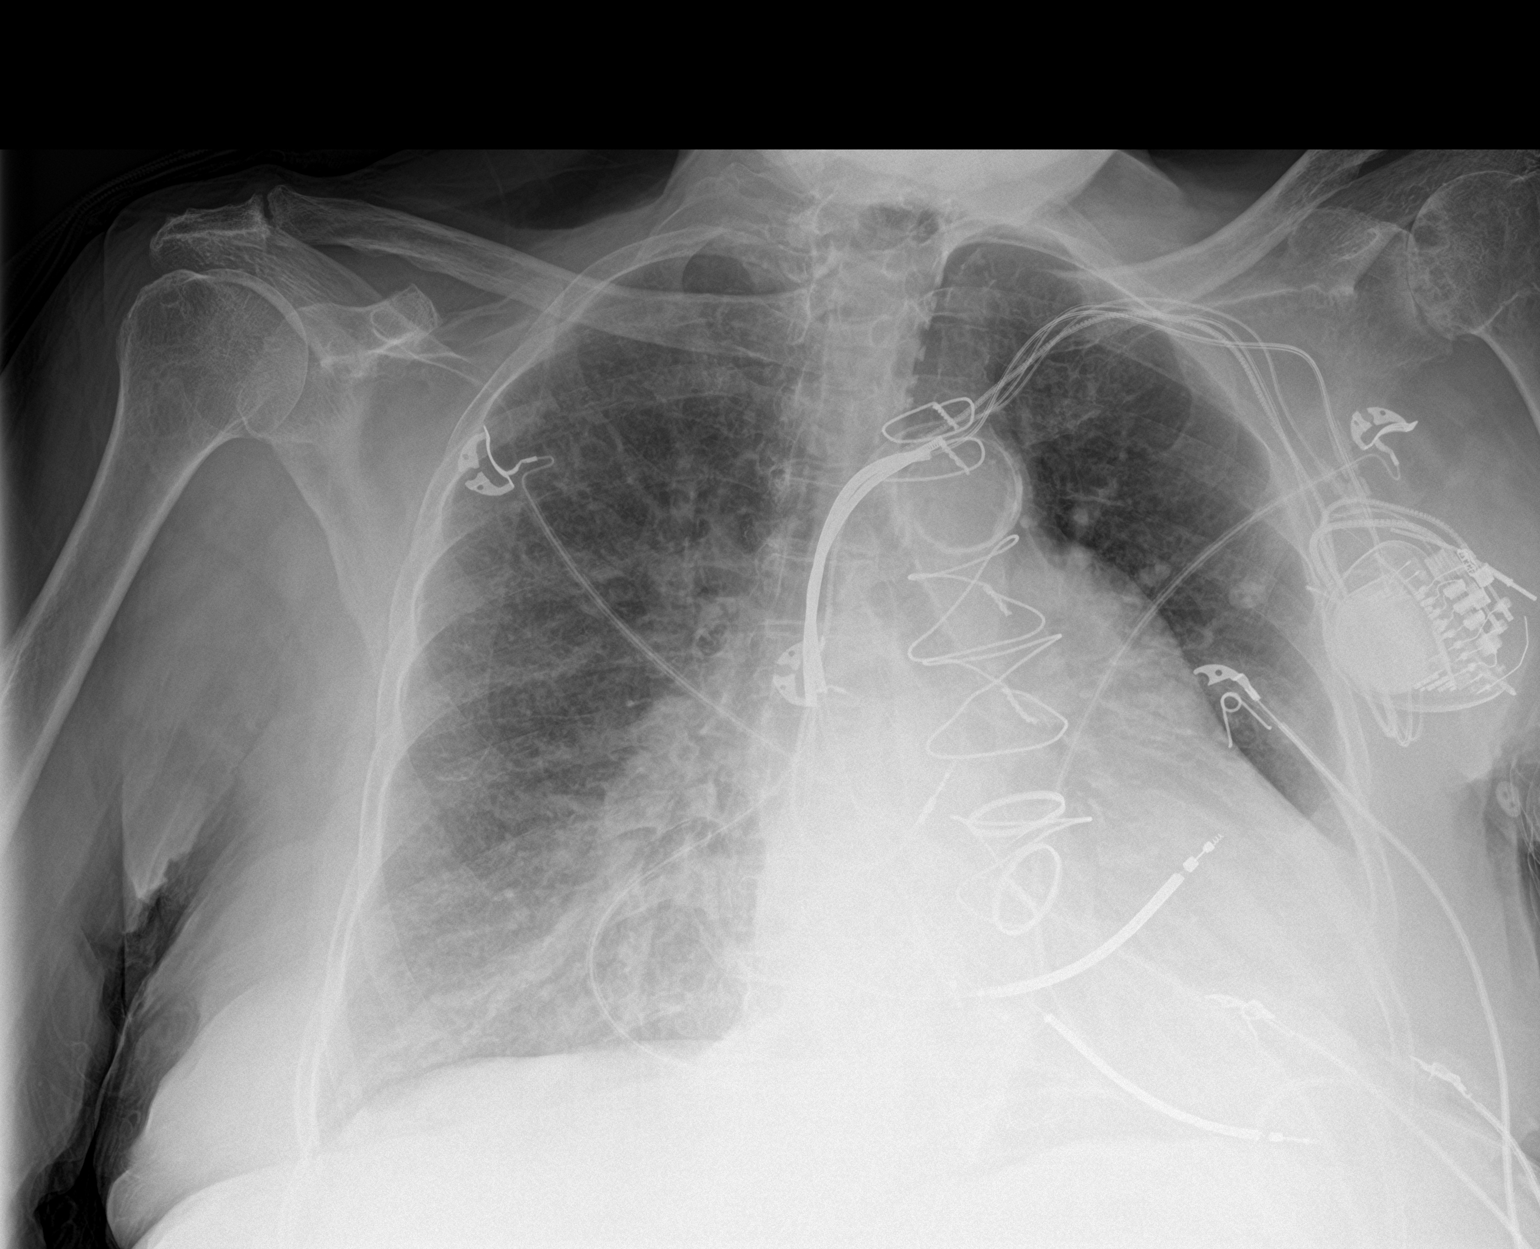

[2 of 2 positions shown; findings below may reference images not displayed]

FINDINGS: Monitoring leads overlie the patient. Multi lead AICD device
overlies the left hemithorax, leads are stable in position. Stable
cardiomegaly status post median sternotomy. Aortic atherosclerosis.
Interval increase in retrocardiac and right lung base consolidation.
No pleural effusion or pneumothorax. Thoracic spine degenerative
changes.
IMPRESSION: Increased retrocardiac and right basilar consolidation which may
represent atelectasis or edema.

## 2020-04-05 NOTE — Patient Instructions (Signed)
Continue warfarin 2 tablets daily except 1 tablet on Tuesdays  Continue greens Recheck in 1 week Order given to [employer] [facility]

## 2020-04-05 NOTE — Telephone Encounter (Signed)
Done.  See coumadin note. 

## 2020-04-05 NOTE — Telephone Encounter (Signed)
Janett Billow w/ [facility]  - INR 2.0 / 24.1    [phone].

## 2020-04-06 ENCOUNTER — Telehealth: Payer: Self-pay

## 2020-04-06 DIAGNOSIS — I0981 Rheumatic heart failure: Secondary | ICD-10-CM | POA: Diagnosis not present

## 2020-04-06 DIAGNOSIS — I130 Hypertensive heart and chronic kidney disease with heart failure and stage 1 through stage 4 chronic kidney disease, or unspecified chronic kidney disease: Secondary | ICD-10-CM | POA: Diagnosis not present

## 2020-04-06 DIAGNOSIS — E1122 Type 2 diabetes mellitus with diabetic chronic kidney disease: Secondary | ICD-10-CM | POA: Diagnosis not present

## 2020-04-06 DIAGNOSIS — I5043 Acute on chronic combined systolic (congestive) and diastolic (congestive) heart failure: Secondary | ICD-10-CM | POA: Diagnosis not present

## 2020-04-06 DIAGNOSIS — N184 Chronic kidney disease, stage 4 (severe): Secondary | ICD-10-CM | POA: Diagnosis not present

## 2020-04-06 DIAGNOSIS — D631 Anemia in chronic kidney disease: Secondary | ICD-10-CM | POA: Diagnosis not present

## 2020-04-06 IMAGING — NM NM PULMONARY VENT & PERF
16 series · 16 of 16 positions shown · non-contrast
Comparison: 12/10/2017

Correlation: Chest radiograph 05/29/2019

CLINICAL DATA: Dyspnea, elevated D-dimer, shortness of breath

EXAM:
NUCLEAR MEDICINE VENTILATION - PERFUSION LUNG SCAN
TECHNIQUE: Ventilation images were obtained in multiple projections using
inhaled aerosol Hc-22m DTPA. Perfusion images were obtained in
multiple projections after intravenous injection of Hc-22m MAA.
RADIOPHARMACEUTICALS:  29.8 mCi of Hc-22m DTPA aerosol inhalation
and 1.5 mCi HcCCm MAA IV

[Series 1: ant/post perf · 4.14mm/px · 1 of 1 slices shown (1 of 2)]
[im 1/1]
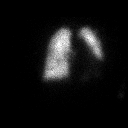

[Series 1: ant/post perf · 4.14mm/px · 1 of 1 slices shown (2 of 2)]
[im 1/1]
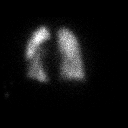

[Series 2: lao/rpo perf · 4.14mm/px · 1 of 1 slices shown (1 of 2)]
[im 1/1]
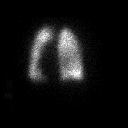

[Series 2: lao/rpo perf · 4.14mm/px · 1 of 1 slices shown (2 of 2)]
[im 1/1]
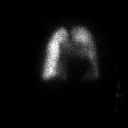

[Series 3: lt lat/rt lat perf · 4.14mm/px · 1 of 1 slices shown (1 of 2)]
[im 1/1]
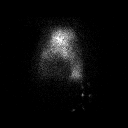

[Series 3: lt lat/rt lat perf · 4.14mm/px · 1 of 1 slices shown (2 of 2)]
[im 1/1]
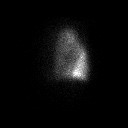

[Series 4: lpo/rao perf · 4.14mm/px · 1 of 1 slices shown (1 of 2)]
[im 1/1]
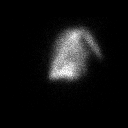

[Series 4: lpo/rao perf · 4.14mm/px · 1 of 1 slices shown (2 of 2)]
[im 1/1]
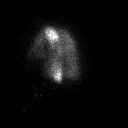

[Series 5: ant/post vent · 4.14mm/px · 1 of 1 slices shown (1 of 2)]
[im 1/1]
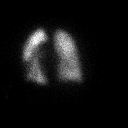

[Series 5: ant/post vent · 4.14mm/px · 1 of 1 slices shown (2 of 2)]
[im 1/1]
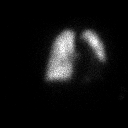

[Series 6: lao/rpo vent · 4.14mm/px · 1 of 1 slices shown (1 of 2)]
[im 1/1]
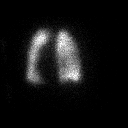

[Series 6: lao/rpo vent · 4.14mm/px · 1 of 1 slices shown (2 of 2)]
[im 1/1]
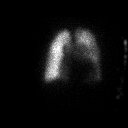

[Series 7: lt lat/rt lat vent · 4.14mm/px · 1 of 1 slices shown (1 of 2)]
[im 1/1]
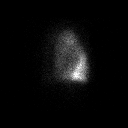

[Series 7: lt lat/rt lat vent · 4.14mm/px · 1 of 1 slices shown (2 of 2)]
[im 1/1]
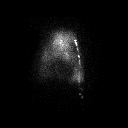

[Series 8: lpo/rao vent · 4.14mm/px · 1 of 1 slices shown (1 of 2)]
[im 1/1]
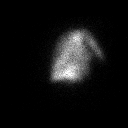

[Series 8: lpo/rao vent · 4.14mm/px · 1 of 1 slices shown (2 of 2)]
[im 1/1]
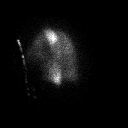

[16 of 16 positions shown; findings below may reference images not displayed]

FINDINGS: Ventilation: Diminished ventilation LEFT lower lobe. Remaining
ventilation normal

Perfusion: Matching diminished perfusion LEFT lower lobe. No
additional segmental or subsegmental perfusion defects.

Chest radiograph: Enlargement of cardiac silhouette post by valvular
replacement and AICD. Atelectasis RIGHT lower lobe, minimally LEFT
lower lobe, less than suggested by scintigraphy.
IMPRESSION: Matching diminished ventilation and perfusion in the LEFT lower
lobe.

Bibasilar atelectasis on chest radiograph greater on RIGHT, minimal
LEFT.

Low probability of pulmonary embolism.

## 2020-04-06 NOTE — Telephone Encounter (Signed)
Patient will need an appt to be evaluated for the wheelchair. Thank you

## 2020-04-06 NOTE — Telephone Encounter (Signed)
Pt is calling she states she needs a wheelchair-  Please call pt

## 2020-04-07 ENCOUNTER — Encounter: Payer: Self-pay | Admitting: Orthopaedic Surgery

## 2020-04-07 ENCOUNTER — Ambulatory Visit (INDEPENDENT_AMBULATORY_CARE_PROVIDER_SITE_OTHER): Payer: Medicare Other | Admitting: Orthopaedic Surgery

## 2020-04-07 ENCOUNTER — Other Ambulatory Visit: Payer: Self-pay

## 2020-04-07 ENCOUNTER — Ambulatory Visit (INDEPENDENT_AMBULATORY_CARE_PROVIDER_SITE_OTHER): Payer: Medicare Other

## 2020-04-07 VITALS — Ht 67.0 in | Wt 185.0 lb

## 2020-04-07 DIAGNOSIS — G8929 Other chronic pain: Secondary | ICD-10-CM

## 2020-04-07 DIAGNOSIS — M25512 Pain in left shoulder: Secondary | ICD-10-CM

## 2020-04-07 DIAGNOSIS — M25561 Pain in right knee: Secondary | ICD-10-CM | POA: Diagnosis not present

## 2020-04-07 NOTE — Telephone Encounter (Signed)
Please advise. Patient is wanting a wheelchair. Can you addend last ov notes to address this or do you want her to come in for an appointment?

## 2020-04-07 NOTE — Telephone Encounter (Signed)
Pt was just in here on 03-23-20? Cant we do an addendum

## 2020-04-07 NOTE — Progress Notes (Signed)
Office Visit Note   Patient: [name] [name]           Date of Birth: 10-19-1931           MRN: [id number] Visit Date: 04/07/2020              Requested by: Perlie Mayo, NP [address] [place],  [place] [address] PCP: Fayrene Helper, MD   Assessment & Plan: Visit Diagnoses:  1. Chronic left shoulder pain   2. Chronic pain of right knee     Plan:  #1: The right shoulder was injected from the posterior approach subacromial and intra-articular.  Tolerated procedure well. #2: She may follow-up in the near future to have the right at her convenience.  To consider take strength   Follow-Up Instructions: Return if symptoms worsen or fail to improve, for injection of right knee.   Orders:  Orders Placed This Encounter  Procedures  . XR KNEE 3 VIEW RIGHT  . XR Shoulder Left   No orders of the defined types were placed in this encounter.     Procedures: No procedures performed   Clinical Data: No additional findings.   Subjective: Chief Complaint  Patient presents with  . Right Knee - Pain   HPI: Patient presents today for right knee pain. She said that it has been hurting for a year. No known injury. Her pain is located anteriorly and laterally. She said that it is worsening. It seems that her pain has started to constantly. She said that it has given way, grinds, and occasionally swells. She takes Tylenol Extra Strength for pain. No previous knee surgery. She has had cortisone injections in the past, but states that they did not help. Her last cortisone injections was months ago.  She also has complaints of left shoulder pain. She said that it has been hurting for months. No known injury. She said that the pain extend down her upper arm. She cannot lift her arm. No numbness or tingling. She is right hand dominant. She states that she just started doing home physical therapy for her shoulder and knee, and it seems to have made the pain worse. Her PCP doctor ordered the  home therapy.   Review of Systems   Objective: Vital Signs: Ht 5\' 7"  (1.702 m)   Wt 185 lb (83.9 kg)   BMI 28.98 kg/m   Physical Exam Constitutional:      Appearance: Normal appearance. She is well-developed.  HENT:     Head: Normocephalic.  Eyes:     Pupils: Pupils are equal, round, and reactive to light.  Pulmonary:     Effort: Pulmonary effort is normal.  Skin:    General: Skin is warm and dry.  Neurological:     Mental Status: She is alert and oriented to person, place, and time.  Psychiatric:        Behavior: Behavior normal.       Ortho Exam  Exam today reveals marked decreased range of motion of the left shoulder in all planes.  Some crepitance with range of motion.  Right knee reveals range of motion lacks 5 degrees of full extension flexes to about 98 to 95degrees.  Marked crepitance with range of motion also noted.  Mild effusion without warmth or erythema.  Positive Baker's cyst.  Specialty Comments:  No specialty comments available.  Imaging: XR KNEE 3 VIEW RIGHT  Result Date: 04/07/2020 X-rays of the chest presented for study right knee reveals bone-on-bone  lateral primary osteoarthritis with calcification of the femoral artery noted.  XR Shoulder Left  Result Date: 04/07/2020 X-rays of the left shoulder reveals bone-on-bone glenohumeral joint with cystic changes in the humerus also.    PMFS History: Current Outpatient Medications  Medication Sig Dispense Refill  . acetaminophen (TYLENOL) 500 MG tablet Take 500 mg by mouth every 6 (six) hours as needed (pain).    Marland Kitchen albuterol (PROVENTIL) (2.5 MG/3ML) 0.083% nebulizer solution Take 3 mLs (2.5 mg total) by nebulization in the morning and at bedtime. *May use additional one time if needed for shortness of breath    . albuterol (VENTOLIN HFA) 108 (90 Base) MCG/ACT inhaler Inhale 1-2 puffs into the lungs every 6 (six) hours as needed for wheezing or shortness of breath.    . allopurinol (ZYLOPRIM) 100 MG  tablet Take 1 tablet (100 mg total) by mouth in the morning.    Marland Kitchen BREO ELLIPTA 100-25 MCG/INH AEPB 1 TAKE 1 INHALATION BY MOUTH ONCE DAILY (Patient taking differently: Inhale 1 puff into the lungs in the morning. ) 1 each 4  . cholecalciferol (VITAMIN D3) 25 MCG (1000 UT) tablet Take 1,000 Units by mouth in the morning.     . docusate sodium (COLACE) 100 MG capsule Take 300 mg by mouth at bedtime.     . fluticasone (FLONASE) 50 MCG/ACT nasal spray SHAKE LIQUID AND USE 2 SPRAYS IN EACH NOSTRIL DAILY (Patient taking differently: Place 2 sprays into both nostrils every morning. ) 48 g 1  . folic acid (FOLVITE) 1 MG tablet TAKE 1 TABLET(1 MG) BY MOUTH DAILY (Patient taking differently: Take 1 mg by mouth every evening. ) 100 tablet 5  . furosemide (LASIX) 20 MG tablet Take 3 tablets (60 mg total) by mouth daily. 90 tablet 1  . gabapentin (NEURONTIN) 300 MG capsule Take 1 capsule (300 mg total) by mouth 2 (two) times daily. 60 capsule 3  . hydrALAZINE (APRESOLINE) 25 MG tablet Take 1 tablet (25 mg total) by mouth 2 (two) times daily. 180 tablet 3  . isosorbide mononitrate (IMDUR) 30 MG 24 hr tablet Take 0.5 tablets (15 mg total) by mouth every morning. 30 tablet 2  . Liniments (SALONPAS PAIN RELIEF PATCH EX) Apply 1 patch topically daily as needed (pain).    . meclizine (ANTIVERT) 12.5 MG tablet TAKE 1 TABLET(12.5 MG) BY MOUTH THREE TIMES DAILY AS NEEDED FOR DIZZINESS (Patient taking differently: Take 12.5 mg by mouth 3 (three) times daily as needed for dizziness or nausea. ) 30 tablet 2  . metolazone (ZAROXOLYN) 2.5 MG tablet Take one tablet on Saturdays. (Patient taking differently: Take 2.5 mg by mouth every Saturday. ) 12 tablet 5  . metoprolol succinate (TOPROL-XL) 100 MG 24 hr tablet TAKE 1 TABLET BY MOUTH EVERY DAY WITH FOOD OR MILK (Patient taking differently: Take 100 mg by mouth every evening. ) 90 tablet 1  . nitroGLYCERIN (NITROSTAT) 0.4 MG SL tablet Place 1 tablet (0.4 mg total) under the  tongue every 5 (five) minutes as needed for chest pain. 30 tablet 0  . ondansetron (ZOFRAN ODT) 4 MG disintegrating tablet Take 1 tablet (4 mg total) by mouth every 8 (eight) hours as needed for nausea or vomiting. 10 tablet 0  . Polyethyl [name] (SYSTANE) 0.4-0.3 % GEL ophthalmic gel Place 1 application into the left eye at bedtime.    . polyethylene glycol powder (GLYCOLAX/MIRALAX) 17 GM/SCOOP powder Take 17 g by mouth daily as needed for mild constipation or moderate constipation.    Marland Kitchen  potassium chloride SA (KLOR-CON) 20 MEQ tablet Take 1 tablet (20 mEq total) by mouth daily.    . pravastatin (PRAVACHOL) 40 MG tablet TAKE 1 TABLET BY MOUTH EVERY EVENING (Patient taking differently: Take 40 mg by mouth every evening. ) 90 tablet 3  . SYNTHROID 75 MCG tablet Take 1 tablet (75 mcg total) by mouth daily before breakfast.    . warfarin (JANTOVEN) 5 MG tablet Take 1-2 tablets (5-10 mg total) by mouth See admin instructions. TAKE 2 TABLETS EVERY DAY EXCEPT 1 TABLET ON TUESDAYS @ 8:30PM     No current facility-administered medications for this visit.   Facility-Administered Medications Ordered in Other Visits  Medication Dose Route Frequency Provider Last Rate Last Admin  . epoetin alfa (EPOGEN,PROCRIT) injection 40,000 Units  40,000 Units Subcutaneous Once Farrel Gobble, MD        Patient Active Problem List   Diagnosis Date Noted  . Acute systolic CHF (congestive heart failure) ([place]) 03/31/2020  . Bilateral lower extremity edema   . Acute on chronic congestive heart failure ([place]) 03/30/2020  . Right-sided epistaxis 03/28/2020  . Acute pain of left shoulder 01/15/2020  . AAA (abdominal aortic aneurysm) without rupture ([place]) 01/14/2020  . COVID-19 virus detected 11/23/2019  . Dyspnea 05/30/2019  . Anemia 05/30/2019  . Gout   . Glaucoma   . GERD (gastroesophageal reflux disease)   . Physical debility 04/28/2019  . Encounter for examination following treatment at hospital  11/03/2018  . Hyponatremia 09/27/2018  . H/O mitral valve replacement with mechanical valve 08/02/2018  . Chronic right hip pain 04/16/2018  . Chronic systolic congestive heart failure ([place]) 11/15/2017  . Trochanteric bursitis, right hip 03/07/2017  . Persistent atrial fibrillation ([place])   . Nocturnal hypoxia 10/08/2016  . Dependence on nocturnal oxygen therapy 10/08/2016  . Hematuria 03/20/2016  . S/P ICD (internal cardiac defibrillator) procedure 02/08/2015  . Presence of cardiac pacemaker 02/08/2015  . CHB (complete heart block) ([place]) 10/07/2014  . Fatigue 07/20/2014  . Absolute anemia 06/24/2014  . Osteopenia 02/10/2014  . Encounter for therapeutic drug monitoring 10/29/2013  . OA (osteoarthritis) of knee 02/19/2013  . Chronic pain of right knee 01/02/2013  . Hemolytic anemia ([place]) 09/24/2012  . High risk medication use 05/24/2012  . COPD (chronic obstructive pulmonary disease) ([place]) 05/19/2012  . Chronic anticoagulation, on coumadin for Mechanical AVR and MVR 04/01/2012  . Cardiorenal syndrome with renal failure 03/22/2012  . Hx of aortic valve replacement, mechanical 03/22/2012  . S/P MVR (mitral valve replacement) 03/22/2012  . Biventricular automatic implantable cardioverter defibrillator in situ 03/22/2012  . CKD (chronic kidney disease), stage IV ([place]) 03/22/2012  . Warfarin-induced coagulopathy ([place]) 03/21/2012  . Iron deficiency 10/04/2011  . Allergic rhinitis 02/14/2011  . Hypothyroidism 07/15/2010  . Mitral valve disease 07/15/2010  . Sinus node dysfunction ([place]) 07/15/2010  . VARICOSE VEINS LOWER EXTREMITIES W/OTH COMPS 02/09/2009  . Prediabetes 06/15/2008  . Hypothyroidism, postsurgical 02/24/2008  . Hyperlipidemia LDL goal <70 02/24/2008  . Essential hypertension 02/24/2008  . Non-ischemic cardiomyopathy with ICD 02/24/2008  . DEGENERATIVE JOINT DISEASE, SPINE 02/24/2008   Past Medical History:  Diagnosis Date  . Acute on chronic diastolic CHF (congestive  heart failure) ([place]) 05/19/2012  . Acute on chronic respiratory failure with hypoxia ([place]) 01/31/2020  . Adenomatous polyp of colon 12/16/2002   Dr. [name]/[facility]  . Allergy   . Calculus of gallbladder with chronic cholecystitis without obstruction 02/09/2009   Qualifier: Diagnosis of  By: [name],  Margaret    . Cataract   . CHB (complete heart block) ([place]) 10/07/2014      . CHF (congestive heart failure) ([place])    a.  EF previously reduced to 25-30% b. EF improved to 60-65% by echo in 10/2017.   Marland Kitchen Chronic kidney disease, stage I 2006  . Constipation       . COPD (chronic obstructive pulmonary disease) ([place])       . DJD (degenerative joint disease) of lumbar spine   . DM (diabetes mellitus) ([place]) 2006  . Esophageal reflux   . Glaucoma   . Gout       . H/O adenomatous polyp of colon 05/24/2012   2004 in [place] colonoscopy 2008 normal    . H/O heart valve replacement with mechanical valve    a. s/p mechanical AVR in 2001 and mechanical MVR in 2004.  Marland Kitchen H/O wisdom tooth extraction   . HIP PAIN, RIGHT 04/23/2009   Qualifier: Diagnosis of  By: [name], [name]    . Hyperlipemia   . IDA (iron deficiency anemia)    Parenteral iron/[name]  . Insomnia       . Nausea without vomiting 06/07/2010   Qualifier: Diagnosis of  By: [name], [name]    . Non-ischemic cardiomyopathy ([place])    a. s/p MDT CRTD  . Obesity, unspecified   . Other and unspecified hyperlipidemia   . Oxygen deficiency 2014   nocturnal;  . Papilloma of breast 03/06/2012   This was diagnosed as a left breast papilloma by ultrasound characteristics and symptoms in 2010. Because of possible medical risk factors no surgical intervention has been done and it did doing annual followups. The area has been stable by physical examination and mammograms and ultrasound since 2010.   Marland Kitchen Spinal stenosis of lumbar region 02/19/2013  . Spondylolisthesis   . Spondylosis   . Thyroid  cancer ([place])    remote thyroidectomy, no recurrence, pt denies in 2016 thyroid cancer  . Type 2 diabetes mellitus with hyperglycemia ([place]) 07/15/2010  . Unspecified hypothyroidism       . 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 Vertigo         Family History  Problem Relation Age of Onset  . Pancreatic cancer Mother   . Heart disease Father   . Heart disease Sister   . Heart attack Sister   . Heart disease Brother   . Diabetes Brother   . Heart disease Brother   . Diabetes Brother   . Hypertension Brother   . Lung cancer Brother     Past Surgical History:  Procedure Laterality Date  . AORTIC AND MITRAL VALVE REPLACEMENT  2001  . BI-VENTRICULAR IMPLANTABLE CARDIOVERTER DEFIBRILLATOR UPGRADE N/A 01/18/2015   Procedure: BI-VENTRICULAR IMPLANTABLE CARDIOVERTER DEFIBRILLATOR UPGRADE;  Surgeon: Evans Lance, MD;  Location[facility] C[facility] LAB;  Service: Cardiovascular;  Laterality: N/A;  . BIV ICD GENERATOR CHANGEOUT N/A 01/30/2019   Procedure: BIV ICD GENERATOR CHANGEOUT;  Surgeon: Evans Lance, MD;  Location: [facility] CV LAB;  Service: Cardiovascular;  Laterality: N/A;  . CARDIAC CATHETERIZATION    . CARDIAC VALVE REPLACEMENT    . CARDIOVERSION N/A 11/13/2016   Procedure: CARDIOVERSION;  Surgeon: Sanda Klein, MD;  Location: [facility] ENDOSCOPY;  Service: Cardiovascular;  Laterality: N/A;  . COLONOSCOPY  06/12/2007   Rourk- Normal rectum/Normal colon  . COLONOSCOPY  12/16/02   small hemorrhoids  . COLONOSCOPY  06/20/2012   Procedure: COLONOSCOPY;  Surgeon: Daneil Dolin, MD;  Location: [facility] ENDO SUITE;  Service: Endoscopy;  Laterality: N/A;  10:45  . defibrillator implanted 2006    . DOPPLER ECHOCARDIOGRAPHY  2012  . DOPPLER ECHOCARDIOGRAPHY  05,06,07,08,09,2011  . ESOPHAGOGASTRODUODENOSCOPY  06/12/2007   Rourk- normal esophagus, small hiatal hernia, otherwise normal stomach, D1, D2  . EYE SURGERY Right 2005  . EYE SURGERY Left 2006  . ICD LEAD REMOVAL Left  02/08/2015   Procedure: ICD LEAD REMOVAL;  Surgeon: Evans Lance, MD;  [facility];  Service: Cardiovascular;  Laterality: Left;  "Will plan extraction and insertion of a BiV PM"  **Dr. Roxan Hockey backing up case**  . IMPLANTABLE CARDIOVERTER DEFIBRILLATOR (ICD) GENERATOR CHANGE Left 02/08/2015   Procedure: ICD GENERATOR CHANGE;  Surgeon: Evans Lance, MD;  Location: [facility];  Service: Cardiovascular;  Laterality: Left;  . INSERT / REPLACE / REMOVE PACEMAKER    . PACEMAKER INSERTION  May 2016  . pacemaker placed  2004  . right breast cyst removed     benign   . right cataract removed     2005  . TEE WITHOUT CARDIOVERSION N/A 11/13/2016   Procedure: TRANSESOPHAGEAL ECHOCARDIOGRAM (TEE);  Surgeon: Sanda Klein, MD;  Location: [facility] ENDOSCOPY;  Service: Cardiovascular;  Laterality: N/A;  . THYROIDECTOMY     Social History   Occupational History  . Occupation: retired    Fish farm manager: RETIRED  Tobacco Use  . Smoking status: Former Smoker    Packs/day: 0.75    Years: 40.00    Pack years: 30.00    Types: Cigarettes    Quit date: 03/08/1987    Years since quitting: 33.1  . Smokeless tobacco: Never Used  Vaping Use  . Vaping Use: Never used  Substance and Sexual Activity  . Alcohol use: No    Alcohol/week: 0.0 standard drinks  . Drug use: No  . Sexual activity: Not Currently

## 2020-04-08 DIAGNOSIS — D631 Anemia in chronic kidney disease: Secondary | ICD-10-CM | POA: Diagnosis not present

## 2020-04-08 DIAGNOSIS — I0981 Rheumatic heart failure: Secondary | ICD-10-CM | POA: Diagnosis not present

## 2020-04-08 DIAGNOSIS — N184 Chronic kidney disease, stage 4 (severe): Secondary | ICD-10-CM | POA: Diagnosis not present

## 2020-04-08 DIAGNOSIS — I5043 Acute on chronic combined systolic (congestive) and diastolic (congestive) heart failure: Secondary | ICD-10-CM | POA: Diagnosis not present

## 2020-04-08 DIAGNOSIS — I130 Hypertensive heart and chronic kidney disease with heart failure and stage 1 through stage 4 chronic kidney disease, or unspecified chronic kidney disease: Secondary | ICD-10-CM | POA: Diagnosis not present

## 2020-04-08 DIAGNOSIS — E1122 Type 2 diabetes mellitus with diabetic chronic kidney disease: Secondary | ICD-10-CM | POA: Diagnosis not present

## 2020-04-08 NOTE — Telephone Encounter (Signed)
June 29 note has been addended, and documents the need, thanks

## 2020-04-08 NOTE — Assessment & Plan Note (Signed)
Increased fall risk and limitation in safe mobilty. Will benefit from and needs wheelchair for safe mobility

## 2020-04-08 NOTE — Assessment & Plan Note (Signed)
Multilevel severe arthritis in lumbar spine with disc disease. Lower extremity weakness, with increased fall risk. Needs wheelchair for safe mobility

## 2020-04-08 NOTE — Assessment & Plan Note (Signed)
Chronic , limits safe mobility, managed by Ortho, and gets intrarticular injections sporadic. High fall risk Will benefit from and m needs wheelchair for safe mobility

## 2020-04-09 ENCOUNTER — Telehealth: Payer: Self-pay | Admitting: Cardiovascular Disease

## 2020-04-09 ENCOUNTER — Other Ambulatory Visit: Payer: Self-pay

## 2020-04-09 DIAGNOSIS — R5381 Other malaise: Secondary | ICD-10-CM

## 2020-04-09 NOTE — Telephone Encounter (Signed)
Still has the metolazone (once weekly Saturday)? If so, go ahead and take it early, once today. Then increase furosemide to 80 mg daily until follow up, please.

## 2020-04-09 NOTE — Telephone Encounter (Signed)
Called and spoke with pt, notified of Dr.Croitoru's advice of taking her metolazone today instead of tomorrow and increasing her lasix to 80mg  until her follow up appt. Notified if she has the 20mg  lasix tabs to take 4 for a total of 80mg . Notified if she has any issues to let us know. Pt verbalized understanding and had no other questions at this time.

## 2020-04-09 NOTE — Telephone Encounter (Signed)
Pt c/o swelling: STAT is pt has developed SOB within 24 hours  1) How much weight have you gained and in what time span?  5 lbs  2) If swelling, where is the swelling located? Both ankles   3) Are you currently taking a fluid pill? Yes  4) Are you currently SOB? Yes  5) Do you have a log of your daily weights (if so, list)?  04/09/20 - 189.2 lbs 04/05/20 - 184.6 lbs  6) Have you gained 3 pounds in a day or 5 pounds in a week? 5 lbs in 1 week  7) Have you traveled recently? No

## 2020-04-09 NOTE — Telephone Encounter (Signed)
Notes and dme order faxed to [facility]

## 2020-04-09 NOTE — Telephone Encounter (Signed)
See previous phone note.  

## 2020-04-09 NOTE — Telephone Encounter (Signed)
New Message:     Please call, pt says she have a question about her Furosemide.

## 2020-04-09 NOTE — Telephone Encounter (Signed)
Called and spoke with pt. She reports that she was in the hospital for 3 days and they changed her furosemide from 40mg  to 60mg . Pt states the 60mg  is not working and she did better with the 40mg . She reports a little swelling in both ankles but denies SOB.  She reports on Monday she weighed 184lbs and today she is 189lbs. Notified I would send this message to Dr.Croitoru and our pharmacist for review and advice. Pt verbalized understanding with no other questions at this time.

## 2020-04-12 ENCOUNTER — Telehealth: Payer: Self-pay | Admitting: Family Medicine

## 2020-04-12 DIAGNOSIS — N184 Chronic kidney disease, stage 4 (severe): Secondary | ICD-10-CM | POA: Diagnosis not present

## 2020-04-12 DIAGNOSIS — I130 Hypertensive heart and chronic kidney disease with heart failure and stage 1 through stage 4 chronic kidney disease, or unspecified chronic kidney disease: Secondary | ICD-10-CM | POA: Diagnosis not present

## 2020-04-12 DIAGNOSIS — E1122 Type 2 diabetes mellitus with diabetic chronic kidney disease: Secondary | ICD-10-CM | POA: Diagnosis not present

## 2020-04-12 DIAGNOSIS — D631 Anemia in chronic kidney disease: Secondary | ICD-10-CM | POA: Diagnosis not present

## 2020-04-12 DIAGNOSIS — I5043 Acute on chronic combined systolic (congestive) and diastolic (congestive) heart failure: Secondary | ICD-10-CM | POA: Diagnosis not present

## 2020-04-12 DIAGNOSIS — I0981 Rheumatic heart failure: Secondary | ICD-10-CM | POA: Diagnosis not present

## 2020-04-12 NOTE — Telephone Encounter (Signed)
I am unsure of what claim this is can we get more details?

## 2020-04-12 NOTE — Telephone Encounter (Signed)
This visit note just needs to be addended with the notes moved to the visit instead of the comments for the pt wheelchair

## 2020-04-12 NOTE — Telephone Encounter (Signed)
[facility] called said the notes are good but medicare is denying the claim and wants to have the comments put in her medical record so that the claim can be approved.

## 2020-04-13 ENCOUNTER — Ambulatory Visit (INDEPENDENT_AMBULATORY_CARE_PROVIDER_SITE_OTHER): Payer: Medicare Other | Admitting: *Deleted

## 2020-04-13 DIAGNOSIS — Z952 Presence of prosthetic heart valve: Secondary | ICD-10-CM | POA: Diagnosis not present

## 2020-04-13 DIAGNOSIS — I0981 Rheumatic heart failure: Secondary | ICD-10-CM | POA: Diagnosis not present

## 2020-04-13 DIAGNOSIS — I130 Hypertensive heart and chronic kidney disease with heart failure and stage 1 through stage 4 chronic kidney disease, or unspecified chronic kidney disease: Secondary | ICD-10-CM | POA: Diagnosis not present

## 2020-04-13 DIAGNOSIS — I5043 Acute on chronic combined systolic (congestive) and diastolic (congestive) heart failure: Secondary | ICD-10-CM | POA: Diagnosis not present

## 2020-04-13 DIAGNOSIS — E1122 Type 2 diabetes mellitus with diabetic chronic kidney disease: Secondary | ICD-10-CM | POA: Diagnosis not present

## 2020-04-13 DIAGNOSIS — N184 Chronic kidney disease, stage 4 (severe): Secondary | ICD-10-CM | POA: Diagnosis not present

## 2020-04-13 DIAGNOSIS — Z5181 Encounter for therapeutic drug level monitoring: Secondary | ICD-10-CM

## 2020-04-13 DIAGNOSIS — D631 Anemia in chronic kidney disease: Secondary | ICD-10-CM | POA: Diagnosis not present

## 2020-04-13 LAB — POCT INR: INR: 1.8 — AB (ref 2.0–3.0)

## 2020-04-13 NOTE — Patient Instructions (Signed)
Take 2 tablets tonight then resume 2 tablets daily except 1 tablet on Tuesdays  Continue greens Recheck in 1 week Order given to [employer] [facility]

## 2020-04-13 NOTE — Telephone Encounter (Signed)
This has been added to the visit, so should be good to be sent, thanks!

## 2020-04-14 ENCOUNTER — Other Ambulatory Visit: Payer: Self-pay

## 2020-04-14 ENCOUNTER — Inpatient Hospital Stay (HOSPITAL_COMMUNITY): Payer: Medicare Other | Attending: Hematology

## 2020-04-14 ENCOUNTER — Other Ambulatory Visit (HOSPITAL_COMMUNITY): Payer: Medicare Other

## 2020-04-14 ENCOUNTER — Ambulatory Visit (HOSPITAL_COMMUNITY): Payer: Medicare Other | Admitting: Nurse Practitioner

## 2020-04-14 ENCOUNTER — Ambulatory Visit (HOSPITAL_COMMUNITY): Payer: Medicare Other

## 2020-04-14 DIAGNOSIS — E611 Iron deficiency: Secondary | ICD-10-CM

## 2020-04-14 DIAGNOSIS — N181 Chronic kidney disease, stage 1: Secondary | ICD-10-CM | POA: Insufficient documentation

## 2020-04-14 DIAGNOSIS — D631 Anemia in chronic kidney disease: Secondary | ICD-10-CM | POA: Insufficient documentation

## 2020-04-14 LAB — CBC WITH DIFFERENTIAL/PLATELET
Abs Immature Granulocytes: 0.05 10*3/uL (ref 0.00–0.07)
Basophils Absolute: 0 10*3/uL (ref 0.0–0.1)
Basophils Relative: 0 %
Eosinophils Absolute: 0 10*3/uL (ref 0.0–0.5)
Eosinophils Relative: 0 %
HCT: 31 % — ABNORMAL LOW (ref 36.0–46.0)
Hemoglobin: 9.8 g/dL — ABNORMAL LOW (ref 12.0–15.0)
Immature Granulocytes: 1 %
Lymphocytes Relative: 11 %
Lymphs Abs: 0.8 10*3/uL (ref 0.7–4.0)
MCH: 29.5 pg (ref 26.0–34.0)
MCHC: 31.6 g/dL (ref 30.0–36.0)
MCV: 93.4 fL (ref 80.0–100.0)
Monocytes Absolute: 0.6 10*3/uL (ref 0.1–1.0)
Monocytes Relative: 9 %
Neutro Abs: 5.5 10*3/uL (ref 1.7–7.7)
Neutrophils Relative %: 79 %
Platelets: 202 10*3/uL (ref 150–400)
RBC: 3.32 MIL/uL — ABNORMAL LOW (ref 3.87–5.11)
RDW: 16.4 % — ABNORMAL HIGH (ref 11.5–15.5)
WBC: 7 10*3/uL (ref 4.0–10.5)
nRBC: 0 % (ref 0.0–0.2)

## 2020-04-14 LAB — COMPREHENSIVE METABOLIC PANEL
ALT: 28 U/L (ref 0–44)
AST: 39 U/L (ref 15–41)
Albumin: 3.7 g/dL (ref 3.5–5.0)
Alkaline Phosphatase: 53 U/L (ref 38–126)
Anion gap: 11 (ref 5–15)
BUN: 57 mg/dL — ABNORMAL HIGH (ref 8–23)
CO2: 31 mmol/L (ref 22–32)
Calcium: 9.4 mg/dL (ref 8.9–10.3)
Chloride: 93 mmol/L — ABNORMAL LOW (ref 98–111)
Creatinine, Ser: 1.51 mg/dL — ABNORMAL HIGH (ref 0.44–1.00)
GFR calc Af Amer: 36 mL/min — ABNORMAL LOW (ref >60)
GFR calc non Af Amer: 31 mL/min — ABNORMAL LOW (ref >60)
Glucose, Bld: 99 mg/dL (ref 70–99)
Potassium: 4.6 mmol/L (ref 3.5–5.1)
Sodium: 135 mmol/L (ref 135–145)
Total Bilirubin: 0.6 mg/dL (ref 0.3–1.2)
Total Protein: 7.5 g/dL (ref 6.5–8.1)

## 2020-04-14 LAB — IRON AND TIBC
Iron: 42 ug/dL (ref 28–170)
Saturation Ratios: 13 % (ref 10.4–31.8)
TIBC: 326 ug/dL (ref 250–450)
UIBC: 284 ug/dL

## 2020-04-14 LAB — FERRITIN: Ferritin: 495 ng/mL — ABNORMAL HIGH (ref 11–307)

## 2020-04-14 LAB — VITAMIN D 25 HYDROXY (VIT D DEFICIENCY, FRACTURES): Vit D, 25-Hydroxy: 34.43 ng/mL (ref 30–100)

## 2020-04-14 LAB — FOLATE: Folate: 72.2 ng/mL (ref >5.9)

## 2020-04-14 LAB — LACTATE DEHYDROGENASE: LDH: 215 U/L — ABNORMAL HIGH (ref 98–192)

## 2020-04-14 LAB — VITAMIN B12: Vitamin B-12: 413 pg/mL (ref 180–914)

## 2020-04-14 NOTE — Telephone Encounter (Signed)
This was sent back to [facility]

## 2020-04-15 ENCOUNTER — Ambulatory Visit: Payer: Medicare Other | Admitting: Family Medicine

## 2020-04-15 DIAGNOSIS — D631 Anemia in chronic kidney disease: Secondary | ICD-10-CM | POA: Diagnosis not present

## 2020-04-15 DIAGNOSIS — N184 Chronic kidney disease, stage 4 (severe): Secondary | ICD-10-CM | POA: Diagnosis not present

## 2020-04-15 DIAGNOSIS — I0981 Rheumatic heart failure: Secondary | ICD-10-CM | POA: Diagnosis not present

## 2020-04-15 DIAGNOSIS — I130 Hypertensive heart and chronic kidney disease with heart failure and stage 1 through stage 4 chronic kidney disease, or unspecified chronic kidney disease: Secondary | ICD-10-CM | POA: Diagnosis not present

## 2020-04-15 DIAGNOSIS — E1122 Type 2 diabetes mellitus with diabetic chronic kidney disease: Secondary | ICD-10-CM | POA: Diagnosis not present

## 2020-04-15 DIAGNOSIS — I5043 Acute on chronic combined systolic (congestive) and diastolic (congestive) heart failure: Secondary | ICD-10-CM | POA: Diagnosis not present

## 2020-04-16 ENCOUNTER — Inpatient Hospital Stay (HOSPITAL_BASED_OUTPATIENT_CLINIC_OR_DEPARTMENT_OTHER): Payer: Medicare Other | Admitting: Nurse Practitioner

## 2020-04-16 ENCOUNTER — Inpatient Hospital Stay (HOSPITAL_COMMUNITY): Payer: Medicare Other

## 2020-04-16 ENCOUNTER — Other Ambulatory Visit (HOSPITAL_COMMUNITY): Payer: Medicare Other

## 2020-04-16 ENCOUNTER — Other Ambulatory Visit: Payer: Self-pay

## 2020-04-16 DIAGNOSIS — E611 Iron deficiency: Secondary | ICD-10-CM

## 2020-04-16 DIAGNOSIS — D649 Anemia, unspecified: Secondary | ICD-10-CM

## 2020-04-16 DIAGNOSIS — N184 Chronic kidney disease, stage 4 (severe): Secondary | ICD-10-CM | POA: Diagnosis not present

## 2020-04-16 DIAGNOSIS — N181 Chronic kidney disease, stage 1: Secondary | ICD-10-CM | POA: Diagnosis not present

## 2020-04-16 DIAGNOSIS — E1122 Type 2 diabetes mellitus with diabetic chronic kidney disease: Secondary | ICD-10-CM | POA: Diagnosis not present

## 2020-04-16 DIAGNOSIS — D631 Anemia in chronic kidney disease: Secondary | ICD-10-CM | POA: Diagnosis not present

## 2020-04-16 DIAGNOSIS — I5043 Acute on chronic combined systolic (congestive) and diastolic (congestive) heart failure: Secondary | ICD-10-CM | POA: Diagnosis not present

## 2020-04-16 DIAGNOSIS — I130 Hypertensive heart and chronic kidney disease with heart failure and stage 1 through stage 4 chronic kidney disease, or unspecified chronic kidney disease: Secondary | ICD-10-CM | POA: Diagnosis not present

## 2020-04-16 DIAGNOSIS — I0981 Rheumatic heart failure: Secondary | ICD-10-CM | POA: Diagnosis not present

## 2020-04-16 MED ORDER — EPOETIN ALFA-EPBX 40000 UNIT/ML IJ SOLN
40000.0000 [IU] | Freq: Once | INTRAMUSCULAR | Status: AC
  Administered 2020-04-16: 40000 [IU] via SUBCUTANEOUS

## 2020-04-16 MED ORDER — EPOETIN ALFA-EPBX 40000 UNIT/ML IJ SOLN
INTRAMUSCULAR | Status: AC
  Filled 2020-04-16: qty 1

## 2020-04-16 NOTE — Assessment & Plan Note (Signed)
1.  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 -She has been on Procrit that initially started 3 years ago, with overall stability in her hemoglobin. -She is now on Procrit injections 40,000 units every 4 weeks. -She is taking iron tablets twice daily and tolerating them well. -Labs on 04/16/2020 showed hemoglobin 9.8, platelets 202, ferritin 495 and percent saturation 13 -She will continue the Procrit injections every 4 weeks at this time. -She will follow-up in 3 months with repeat labs  2.  Chronic kidney disease: -Labs on 04/14/2020 showed creatinine 1.51 -Continue to follow-up with nephrology.

## 2020-04-16 NOTE — Progress Notes (Signed)
[place], [place] [address]   CLINIC:  Medical Oncology/Hematology  PCP:  Fayrene Helper, MD [address] [place] [address] [phone]   REASON FOR VISIT: Follow-up for normocytic anemia   CURRENT THERAPY: Procrit injections every 4 weeks   INTERVAL HISTORY:  [name] [name] 86 y.o. female returns for routine follow-up for normocytic anemia.  Patient denies any bright red bleeding per rectum or melena. Denies any nausea, vomiting, or diarrhea. Denies any new pains. Had not noticed any recent bleeding such as epistaxis, hematuria or hematochezia. Denies recent chest pain on exertion, shortness of breath on minimal exertion, pre-syncopal episodes, or palpitations. Denies any numbness or tingling in hands or feet. Denies any recent fevers, infections, or recent hospitalizations. Patient reports appetite at 75% and energy level at 25%.  [name] [name] is eating well maintain her weight at this time.     REVIEW OF SYSTEMS:  Review of Systems  Neurological: Positive for dizziness.  All other systems reviewed and are negative.    PAST MEDICAL/SURGICAL HISTORY:  Past Medical History:  Diagnosis Date  . Acute on chronic diastolic CHF (congestive heart failure) ([place]) 05/19/2012  . Acute on chronic respiratory failure with hypoxia ([place]) 01/31/2020  . Adenomatous polyp of colon 12/16/2002   Dr. [name]/[facility]  . Allergy   . Calculus of gallbladder with chronic cholecystitis without obstruction 02/09/2009   Qualifier: Diagnosis of  By: [name], [name]    . Cataract   . CHB (complete heart block) ([place]) 10/07/2014      . CHF (congestive heart failure) ([place])    a.  EF previously reduced to 25-30% b. EF improved to 60-65% by echo in 10/2017.   Marland Kitchen Chronic kidney disease, stage I 2006  . Constipation       . COPD (chronic obstructive pulmonary disease) ([place])       . DJD (degenerative joint disease) of  lumbar spine   . DM (diabetes mellitus) ([place]) 2006  . Esophageal reflux   . Glaucoma   . Gout       . H/O adenomatous polyp of colon 05/24/2012   2004 in [place] [facility]oscopy 2008 normal    . H/O heart valve replacement with mechanical valve    a. s/p mechanical AVR in 2001 and mechanical MVR in 2004.  Marland Kitchen H/O wisdom tooth extraction   . HIP PAIN, RIGHT 04/23/2009   Qualifier: Diagnosis of  By: [name], [name]    . Hyperlipemia   . IDA (iron deficiency anemia)    Parenteral iron/[name]  . Insomnia       . Nausea without vomiting 06/07/2010   Qualifier: Diagnosis of  By: [name], [name]    . Non-ischemic cardiomyopathy ([place])    a. s/p MDT CRTD  . Obesity, unspecified   . Other and unspecified hyperlipidemia   . Oxygen deficiency 2014   nocturnal;  . Papilloma of breast 03/06/2012   This was diagnosed as a left breast papilloma by ultrasound characteristics and symptoms in 2010. Because of possible medical risk factors no surgical intervention has been done and it did doing annual followups. The area has been stable by physical examination and mammograms and ultrasound since 2010.   Marland Kitchen Spinal stenosis of lumbar region 02/19/2013  . Spondylolisthesis   . Spondylosis   . Thyroid cancer ([place])    remote thyroidectomy, no recurrence, pt denies in 2016 thyroid cancer  .  Type 2 diabetes mellitus with hyperglycemia ([place]) 07/15/2010  . Unspecified hypothyroidism       . 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 Vertigo        Past Surgical History:  Procedure Laterality Date  . AORTIC AND MITRAL VALVE REPLACEMENT  2001  . BI-VENTRICULAR IMPLANTABLE CARDIOVERTER DEFIBRILLATOR UPGRADE N/A 01/18/2015   Procedure: BI-VENTRICULAR IMPLANTABLE CARDIOVERTER DEFIBRILLATOR UPGRADE;  Surgeon: Evans Lance, MD;  Location: [facility] C[facility] LAB;  Service: Cardiovascular;  Laterality: N/A;  . BIV ICD GENERATOR CHANGEOUT N/A 01/30/2019   Procedure: BIV ICD GENERATOR  CHANGEOUT;  Surgeon: Evans Lance, MD;  Location: [facility] CV LAB;  Service: Cardiovascular;  Laterality: N/A;  . CARDIAC CATHETERIZATION    . CARDIAC VALVE REPLACEMENT    . CARDIOVERSION N/A 11/13/2016   Procedure: CARDIOVERSION;  Surgeon: Sanda Klein, MD;  Location: [facility] ENDOSCOPY;  Service: Cardiovascular;  Laterality: N/A;  . COLONOSCOPY  06/12/2007   [place]ourk- Normal rectum/Normal colon  . COLONOSCOPY  12/16/02   small hemorrhoids  . COLONOSCOPY  06/20/2012   Procedure: COLONOSCOPY;  Surgeon: Daneil Dolin, MD;  Location: [facility] ENDO SUITE;  Service: Endoscopy;  Laterality: N/A;  10:45  . defibrillator implanted 2006    . DOPPLER ECHOCARDIOGRAPHY  2012  . DOPPLER ECHOCARDIOGRAPHY  05,06,07,08,09,2011  . ESOPHAGOGASTRODUODENOSCOPY  06/12/2007   Rourk- normal esophagus, small hiatal hernia, otherwise normal stomach, D1, D2  . EYE SURGERY Right 2005  . EYE SURGERY Left 2006  . ICD LEAD REMOVAL Left 02/08/2015   Procedure: ICD LEAD REMOVAL;  Surgeon: Evans Lance, MD;  Location: [facility] OR;  Service: Cardiovascular;  Laterality: Left;  "Will plan extraction and insertion of a BiV PM"  **Dr. Roxan Hockey backing up case**  . IMPLANTABLE CARDIOVERTER DEFIBRILLATOR (ICD) GENERATOR CHANGE Left 02/08/2015   Procedure: ICD GENERATOR CHANGE;  Surgeon: Evans Lance, MD;  Location: [facility];  Service: Cardiovascular;  Laterality: Left;  . INSERT / REPLACE / REMOVE PACEMAKER    . PACEMAKER INSERTION  May 2016  . pacemaker placed  2004  . right breast cyst removed     benign   . right cataract removed     2005  . TEE WITHOUT CARDIOVERSION N/A 11/13/2016   Procedure: TRANSESOPHAGEAL ECHOCARDIOGRAM (TEE);  Surgeon: Sanda Klein, MD;  Location: [facility] ENDOSCOPY;  Service: Cardiovascular;  Laterality: N/A;  . THYROIDECTOMY       SOCIAL HISTORY:  Social History   Socioeconomic History  . Marital status: Widowed    Spouse name: Not on file  . Number of children: 0  . Years of education: Not on file   . Highest education level: Not on file  Occupational History  . Occupation: retired    Fish farm manager: RETIRED  Tobacco Use  . Smoking status: Former Smoker    Packs/day: 0.75    Years: 40.00    Pack years: 30.00    Types: Cigarettes    Quit date: 03/08/1987    Years since quitting: 33.1  . Smokeless tobacco: Never Used  Vaping Use  . Vaping Use: Never used  Substance and Sexual Activity  . Alcohol use: No    Alcohol/week: 0.0 standard drinks  . Drug use: No  . Sexual activity: Not Currently  Other Topics Concern  . Not on file  Social History Narrative   From [place] (2004)   Social Determinants of Health   Financial Resource Strain:   . Difficulty of Paying Living Expenses:   Food Insecurity:   .  Worried About Charity fundraiser in the Last Year:   . Arboriculturist in the Last Year:   Transportation Needs:   . Film/video editor (Medical):   Marland Kitchen Lack of Transportation (Non-Medical):   Physical Activity:   . Days of Exercise per Week:   . Minutes of Exercise per Session:   Stress:   . Feeling of Stress :   Social Connections:   . Frequency of Communication with Friends and Family:   . Frequency of Social Gatherings with Friends and Family:   . Attends Religious Services:   . Active Member of Clubs or Organizations:   . Attends Archivist Meetings:   Marland Kitchen Marital Status:   Intimate Partner Violence:   . Fear of Current or Ex-Partner:   . Emotionally Abused:   Marland Kitchen Physically Abused:   . Sexually Abused:     FAMILY HISTORY:  Family History  Problem Relation Age of Onset  . Pancreatic cancer Mother   . Heart disease Father   . Heart disease Sister   . Heart attack Sister   . Heart disease Brother   . Diabetes Brother   . Heart disease Brother   . Diabetes Brother   . Hypertension Brother   . Lung cancer Brother     CURRENT MEDICATIONS:  Outpatient Encounter Medications as of 04/16/2020  Medication Sig  . allopurinol (ZYLOPRIM) 100 MG tablet  Take 1 tablet (100 mg total) by mouth in the morning.  Marland Kitchen BREO ELLIPTA 100-25 MCG/INH AEPB 1 TAKE 1 INHALATION BY MOUTH ONCE DAILY (Patient taking differently: Inhale 1 puff into the lungs in the morning. )  . cholecalciferol (VITAMIN D3) 25 MCG (1000 UT) tablet Take 1,000 Units by mouth in the morning.   . docusate sodium (COLACE) 100 MG capsule Take 300 mg by mouth at bedtime.   . fluticasone (FLONASE) 50 MCG/ACT nasal spray SHAKE LIQUID AND USE 2 SPRAYS IN EACH NOSTRIL DAILY (Patient taking differently: Place 2 sprays into both nostrils every morning. )  . folic acid (FOLVITE) 1 MG tablet TAKE 1 TABLET(1 MG) BY MOUTH DAILY (Patient taking differently: Take 1 mg by mouth every evening. )  . furosemide (LASIX) 20 MG tablet Take 3 tablets (60 mg total) by mouth daily.  Marland Kitchen gabapentin (NEURONTIN) 300 MG capsule Take 1 capsule (300 mg total) by mouth 2 (two) times daily.  . hydrALAZINE (APRESOLINE) 25 MG tablet Take 1 tablet (25 mg total) by mouth 2 (two) times daily.  . isosorbide mononitrate (IMDUR) 30 MG 24 hr tablet Take 0.5 tablets (15 mg total) by mouth every morning.  . Liniments (SALONPAS PAIN RELIEF PATCH EX) Apply 1 patch topically daily as needed (pain).  . meclizine (ANTIVERT) 12.5 MG tablet TAKE 1 TABLET(12.5 MG) BY MOUTH THREE TIMES DAILY AS NEEDED FOR DIZZINESS (Patient taking differently: Take 12.5 mg by mouth 3 (three) times daily as needed for dizziness or nausea. )  . metolazone (ZAROXOLYN) 2.5 MG tablet Take one tablet on Saturdays. (Patient taking differently: Take 2.5 mg by mouth every Saturday. )  . metoprolol succinate (TOPROL-XL) 100 MG 24 hr tablet TAKE 1 TABLET BY MOUTH EVERY DAY WITH FOOD OR MILK (Patient taking differently: Take 100 mg by mouth every evening. )  . Polyethyl [name] (SYSTANE) 0.4-0.3 % GEL ophthalmic gel Place 1 application into the left eye at bedtime.  . polyethylene glycol powder (GLYCOLAX/MIRALAX) 17 GM/SCOOP powder Take 17 g by mouth daily as  needed for mild constipation  or moderate constipation.  . potassium chloride SA (KLOR-CON) 20 MEQ tablet Take 1 tablet (20 mEq total) by mouth daily.  . pravastatin (PRAVACHOL) 40 MG tablet TAKE 1 TABLET BY MOUTH EVERY EVENING (Patient taking differently: Take 40 mg by mouth every evening. )  . SYNTHROID 75 MCG tablet Take 1 tablet (75 mcg total) by mouth daily before breakfast.  . warfarin (JANTOVEN) 5 MG tablet Take 1-2 tablets (5-10 mg total) by mouth See admin instructions. TAKE 2 TABLETS EVERY DAY EXCEPT 1 TABLET ON TUESDAYS @ 8:30PM  . acetaminophen (TYLENOL) 500 MG tablet Take 500 mg by mouth every 6 (six) hours as needed (pain). (Patient not taking: Reported on 04/16/2020)  . albuterol (PROVENTIL) (2.5 MG/3ML) 0.083% nebulizer solution Take 3 mLs (2.5 mg total) by nebulization in the morning and at bedtime. *May use additional one time if needed for shortness of breath (Patient not taking: Reported on 04/16/2020)  . albuterol (VENTOLIN HFA) 108 (90 Base) MCG/ACT inhaler Inhale 1-2 puffs into the lungs every 6 (six) hours as needed for wheezing or shortness of breath. (Patient not taking: Reported on 04/16/2020)  . nitroGLYCERIN (NITROSTAT) 0.4 MG SL tablet Place 1 tablet (0.4 mg total) under the tongue every 5 (five) minutes as needed for chest pain. (Patient not taking: Reported on 04/16/2020)  . ondansetron (ZOFRAN ODT) 4 MG disintegrating tablet Take 1 tablet (4 mg total) by mouth every 8 (eight) hours as needed for nausea or vomiting. (Patient not taking: Reported on 04/16/2020)   Facility-Administered Encounter Medications as of 04/16/2020  Medication  . epoetin alfa (EPOGEN,PROCRIT) injection 40,000 Units  . [COMPLETED] epoetin alfa-epbx (RETACRIT) injection 40,000 Units    ALLERGIES:  Allergies  Allergen Reactions  . Penicillins Other (See Comments)    Bruise    . Aspirin Other (See Comments)    On coumadin  . Fentanyl Dermatitis    Rash with patch   . Niacin Itching      PHYSICAL EXAM:  ECOG Performance status: 1  Vitals:   04/16/20 1328  BP: (!) 139/55  Pulse: 81  Resp: 18  Temp: 98.4 F (36.9 C)  SpO2: 100%   Filed Weights   04/16/20 1328  Weight: 194 lb 8 oz (88.2 kg)   Physical Exam Constitutional:      Appearance: Normal appearance. [name] [name] is normal weight.  Cardiovascular:     Rate and Rhythm: Normal rate and regular rhythm.     Heart sounds: Normal heart sounds.  Pulmonary:     Effort: Pulmonary effort is normal.     Breath sounds: Normal breath sounds.  Abdominal:     General: Bowel sounds are normal.     Palpations: Abdomen is soft.  Musculoskeletal:        General: Normal range of motion.  Skin:    General: Skin is warm.  Neurological:     Mental Status: [name] [name] is alert and oriented to person, place, and time. Mental status is at baseline.  Psychiatric:        Mood and Affect: Mood normal.        Behavior: Behavior normal.        Thought Content: Thought content normal.        Judgment: Judgment normal.      LABORATORY DATA:  I have reviewed the labs as listed.  CBC    Component Value Date/Time   WBC 7.0 04/14/2020 1030   RBC 3.32 (L) 04/14/2020 1030   HGB 9.8 (L) 04/14/2020 1030  HGB 9.6 (L) 01/28/2019 1218   HCT 31.0 (L) 04/14/2020 1030   HCT 29.9 (L) 01/28/2019 1218   PLT 202 04/14/2020 1030   PLT 199 01/28/2019 1218   MCV 93.4 04/14/2020 1030   MCV 90 01/28/2019 1218   MCH 29.5 04/14/2020 1030   MCHC 31.6 04/14/2020 1030   RDW 16.4 (H) 04/14/2020 1030   RDW 17.1 (H) 01/28/2019 1218   LYMPHSABS 0.8 04/14/2020 1030   LYMPHSABS 0.7 01/28/2019 1218   MONOABS 0.6 04/14/2020 1030   EOSABS 0.0 04/14/2020 1030   EOSABS 0.0 01/28/2019 1218   BASOSABS 0.0 04/14/2020 1030   BASOSABS 0.0 01/28/2019 1218   CMP Latest Ref Rng & Units 04/14/2020 04/02/2020 04/01/2020  Glucose 70 - 99 mg/dL 99 92 92  BUN 8 - 23 mg/dL 57(H) 42(H) 38(H)  Creatinine 0.44 - 1.00 mg/dL 1.51(H) 1.40(H) 1.40(H)  Sodium 135 - 145 mmol/L  135 131(L) 131(L)  Potassium 3.5 - 5.1 mmol/L 4.6 4.3 4.3  Chloride 98 - 111 mmol/L 93(L) 87(L) 88(L)  CO2 22 - 32 mmol/L 31 34(H) 33(H)  Calcium 8.9 - 10.3 mg/dL 9.4 8.7(L) 8.9  Total Protein 6.5 - 8.1 g/dL 7.5 - -  Total Bilirubin 0.3 - 1.2 mg/dL 0.6 - -  Alkaline Phos 38 - 126 U/L 53 - -  AST 15 - 41 U/L 39 - -  ALT 0 - 44 U/L 28 - -    All questions were answered to patient's stated satisfaction. Encouraged patient to call with any new concerns or questions before his next visit to the cancer center and we can certain see him sooner, if needed.     ASSESSMENT & PLAN:  Iron deficiency 1.  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 -[name] [name] has been on Procrit that initially started 3 years ago, with overall stability in her hemoglobin. -[name] [name] is now on Procrit injections 40,000 units every 4 weeks. -[name] [name] is taking iron tablets twice daily and tolerating them well. -Labs on 04/16/2020 showed hemoglobin 9.8, platelets 202, ferritin 495 and percent saturation 13 -[name] [name] will continue the Procrit injections every 4 weeks at this time. -[name] [name] will follow-up in 3 months with repeat labs  2.  Chronic kidney disease: -Labs on 04/14/2020 showed creatinine 1.51 -Continue to follow-up with nephrology.       Orders placed this encounter:  Orders Placed This Encounter  Procedures  . Lactate dehydrogenase  . CBC with Differential/Platelet  . Comprehensive metabolic panel  . Ferritin  . Iron and TIBC  . Vitamin B12  . VITAMIN D 25 Hydroxy (Vit-D Deficiency, Fractures)      Francene Finders, FNP-C [place] [phone]

## 2020-04-18 ENCOUNTER — Other Ambulatory Visit: Payer: Self-pay | Admitting: Family Medicine

## 2020-04-19 ENCOUNTER — Ambulatory Visit (INDEPENDENT_AMBULATORY_CARE_PROVIDER_SITE_OTHER): Payer: Medicare Other

## 2020-04-19 DIAGNOSIS — I5043 Acute on chronic combined systolic (congestive) and diastolic (congestive) heart failure: Secondary | ICD-10-CM | POA: Diagnosis not present

## 2020-04-19 DIAGNOSIS — E1122 Type 2 diabetes mellitus with diabetic chronic kidney disease: Secondary | ICD-10-CM | POA: Diagnosis not present

## 2020-04-19 DIAGNOSIS — Z950 Presence of cardiac pacemaker: Secondary | ICD-10-CM | POA: Diagnosis not present

## 2020-04-19 DIAGNOSIS — D631 Anemia in chronic kidney disease: Secondary | ICD-10-CM | POA: Diagnosis not present

## 2020-04-19 DIAGNOSIS — I130 Hypertensive heart and chronic kidney disease with heart failure and stage 1 through stage 4 chronic kidney disease, or unspecified chronic kidney disease: Secondary | ICD-10-CM | POA: Diagnosis not present

## 2020-04-19 DIAGNOSIS — N184 Chronic kidney disease, stage 4 (severe): Secondary | ICD-10-CM | POA: Diagnosis not present

## 2020-04-19 DIAGNOSIS — I5042 Chronic combined systolic (congestive) and diastolic (congestive) heart failure: Secondary | ICD-10-CM | POA: Diagnosis not present

## 2020-04-19 DIAGNOSIS — I0981 Rheumatic heart failure: Secondary | ICD-10-CM | POA: Diagnosis not present

## 2020-04-19 NOTE — Progress Notes (Deleted)
Cardiology Office Note   Date:  04/19/2020   ID:  [name] [name], DOB February 24, 1932, MRN [id number]  PCP:  [name] Helper, MD  Cardiologist:  Dr.Croitouro  No chief complaint on file.    History of Present Illness: [name] [name] is a 86 y.o. 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  She was seen on consultation on 02/02/2020 while hospitalized for CHF and atrial fibrillation.  Repeat echocardiogram revealed an EF of 30 to 35% with global hypokinesis and severely elevated PASP of 71.2 mmHg.  She was treated with IV Lasix.  During consultation she appeared euvolemic at the time.  IV Lasix was discontinued and she was started on p.o. Lasix.  She was to continue on beta-blocker hydralazine with strict INO's and renal function labs.  She was to continue on O2 via nasal cannula.  She was discharged on 02/03/2020.  She was readmitted on 04/02/2020 with complaints of bilateral lower extremity edema worsening over 3 to 4 days with worsening breathing.  She was given IV Lasix again x1 and admitted for further observation.  Dr. Domenic Polite, saw her on consultation she was advised to continue IV Lasix until she was euvolemic and then increase Lasix to 60 mg daily on discharge.  She was to continue O2 via nasal cannula at 2 L.  She was to return to her home medications and anticoagulation therapy.    She called our office on 04/09/2020 with complaints of weight gain of 5  pounds edema in both ankles.  She was advised to take metolazone as she has only been taking it once weekly on Saturdays.  She was advised to take an extra dose on day of phone call.  Lasix was increased to 80 mg daily until follow-up.  Past Medical History:  Diagnosis Date  . Acute on chronic diastolic CHF (congestive heart failure) ([place]) 05/19/2012  . Acute on chronic respiratory failure with hypoxia ([place]) 01/31/2020  . Adenomatous polyp of colon 12/16/2002   Dr. [name]/[facility]  . Allergy   . Calculus of gallbladder with chronic cholecystitis without obstruction 02/09/2009   Qualifier: Diagnosis of  By: [name], [name]    . Cataract   . CHB (complete heart block) ([place]) 10/07/2014      . CHF (congestive heart failure) ([place])    a.  EF previously reduced to 25-30% b. EF improved to 60-65% by echo in 10/2017.   Marland Kitchen Chronic kidney disease, stage I 2006  . Constipation       . COPD (chronic obstructive pulmonary disease) ([place])       . DJD (degenerative joint disease) of lumbar spine   . DM (diabetes mellitus) ([place]) 2006  . Esophageal reflux   . Glaucoma   . Gout       . H/O adenomatous polyp of colon 05/24/2012   2004 in [place] colonoscopy 2008 normal    . H/O heart valve replacement with mechanical valve    a. s/p mechanical AVR in 2001 and mechanical MVR in 2004.  Marland Kitchen  H/O wisdom tooth extraction   . HIP PAIN, RIGHT 04/23/2009   Qualifier: Diagnosis of  By: [name], [name]    . Hyperlipemia   . IDA (iron deficiency anemia)    Parenteral iron/[name]  . Insomnia       . Nausea without vomiting 06/07/2010   Qualifier: Diagnosis of  By: [name], [name]    . Non-ischemic cardiomyopathy ([place])    a. s/p MDT CRTD  . Obesity, unspecified   . Other and unspecified hyperlipidemia   . Oxygen deficiency 2014   nocturnal;  . Papilloma of breast 03/06/2012   This was diagnosed as a left breast papilloma by ultrasound characteristics and  symptoms in 2010. Because of possible medical risk factors no surgical intervention has been done and it did doing annual followups. The area has been stable by physical examination and mammograms and ultrasound since 2010.   Marland Kitchen Spinal stenosis of lumbar region 02/19/2013  . Spondylolisthesis   . Spondylosis   . Thyroid cancer ([place])    remote thyroidectomy, no recurrence, pt denies in 2016 thyroid cancer  . Type 2 diabetes mellitus with hyperglycemia ([place]) 07/15/2010  . Unspecified hypothyroidism       . 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 Vertigo         Past Surgical History:  Procedure Laterality Date  . AORTIC AND MITRAL VALVE REPLACEMENT  2001  . BI-VENTRICULAR IMPLANTABLE CARDIOVERTER DEFIBRILLATOR UPGRADE N/A 01/18/2015   Procedure: BI-VENTRICULAR IMPLANTABLE CARDIOVERTER DEFIBRILLATOR UPGRADE;  Surgeon: Evans Lance, MD;  Location: [facility] CATH LAB;  Service: Cardiovascular;  Laterality: N/A;  . BIV ICD GENERATOR CHANGEOUT N/A 01/30/2019   Procedure: BIV ICD GENERATOR CHANGEOUT;  Surgeon: Evans Lance, MD;  Location: [facility] CV LAB;  Service: Cardiovascular;  Laterality: N/A;  . CARDIAC CATHETERIZATION    . CARDIAC VALVE REPLACEMENT    . CARDIOVERSION N/A 11/13/2016   Procedure: CARDIOVERSION;  Surgeon: Sanda Klein, MD;  Location: [facility] ENDOSCOPY;  Service: Cardiovascular;  Laterality: N/A;  . COLONOSCOPY  06/12/2007   [place]- Normal rectum/Normal colon  . COLONOSCOPY  12/16/02   small hemorrhoids  . COLONOSCOPY  06/20/2012   Procedure: COLONOSCOPY;  Surgeon: Daneil Dolin, MD;  Location: [facility] ENDO SUITE;  Service: Endoscopy;  Laterality: N/A;  10:45  . defibrillator implanted 2006    . DOPPLER ECHOCARDIOGRAPHY  2012  . DOPPLER ECHOCARDIOGRAPHY  05,06,07,08,09,2011  . ESOPHAGOGASTRODUODENOSCOPY  06/12/2007   [place]k- normal esophagus, small hiatal hernia, otherwise normal stomach, D1, D2  . EYE SURGERY Right 2005  . EYE SURGERY Left 2006  . ICD LEAD  REMOVAL Left 02/08/2015   Procedure: ICD LEAD REMOVAL;  Surgeon: Evans Lance, MD;  Location: [facility] OR;  Service: Cardiovascular;  Laterality: Left;  "Will plan extraction and insertion of a BiV PM"  **Dr. Roxan Hockey backing up case**  . IMPLANTABLE CARDIOVERTER DEFIBRILLATOR (ICD) GENERATOR CHANGE Left 02/08/2015   Procedure: ICD GENERATOR CHANGE;  Surgeon: Evans Lance, MD;  Location: [facility];  Service: Cardiovascular;  Laterality: Left;  . INSERT / REPLACE / REMOVE PACEMAKER    . PACEMAKER INSERTION  May 2016  . pacemaker placed  2004  . right breast cyst removed     benign   . right cataract removed     2005  . TEE WITHOUT CARDIOVERSION N/A 11/13/2016   Procedure: TRANSESOPHAGEAL ECHOCARDIOGRAM (TEE);  Surgeon: Sanda Klein, MD;  Location: [facility] ENDOSCOPY;  Service: Cardiovascular;  Laterality: N/A;  . THYROIDECTOMY  Current Outpatient Medications  Medication Sig Dispense Refill  . acetaminophen (TYLENOL) 500 MG tablet Take 500 mg by mouth every 6 (six) hours as needed (pain). (Patient not taking: Reported on 04/16/2020)    . albuterol (PROVENTIL) (2.5 MG/3ML) 0.083% nebulizer solution Take 3 mLs (2.5 mg total) by nebulization in the morning and at bedtime. *May use additional one time if needed for shortness of breath (Patient not taking: Reported on 04/16/2020)    . albuterol (VENTOLIN HFA) 108 (90 Base) MCG/ACT inhaler Inhale 1-2 puffs into the lungs every 6 (six) hours as needed for wheezing or shortness of breath. (Patient not taking: Reported on 04/16/2020)    . allopurinol (ZYLOPRIM) 100 MG tablet Take 1 tablet (100 mg total) by mouth in the morning.    Marland Kitchen BREO ELLIPTA 100-25 MCG/INH AEPB 1 TAKE 1 INHALATION BY MOUTH ONCE DAILY (Patient taking differently: Inhale 1 puff into the lungs in the morning. ) 1 each 4  . cholecalciferol (VITAMIN D3) 25 MCG (1000 UT) tablet Take 1,000 Units by mouth in the morning.     . docusate sodium (COLACE) 100 MG capsule Take 300 mg by mouth at  bedtime.     . fluticasone (FLONASE) 50 MCG/ACT nasal spray SHAKE LIQUID AND USE 2 SPRAYS IN EACH NOSTRIL DAILY (Patient taking differently: Place 2 sprays into both nostrils every morning. ) 48 g 1  . folic acid (FOLVITE) 1 MG tablet TAKE 1 TABLET(1 MG) BY MOUTH DAILY (Patient taking differently: Take 1 mg by mouth every evening. ) 100 tablet 5  . furosemide (LASIX) 20 MG tablet Take 3 tablets (60 mg total) by mouth daily. 90 tablet 1  . gabapentin (NEURONTIN) 300 MG capsule Take 1 capsule (300 mg total) by mouth 2 (two) times daily. 60 capsule 3  . hydrALAZINE (APRESOLINE) 25 MG tablet Take 1 tablet (25 mg total) by mouth 2 (two) times daily. 180 tablet 3  . isosorbide mononitrate (IMDUR) 30 MG 24 hr tablet Take 0.5 tablets (15 mg total) by mouth every morning. 30 tablet 2  . Liniments (SALONPAS PAIN RELIEF PATCH EX) Apply 1 patch topically daily as needed (pain).    . meclizine (ANTIVERT) 12.5 MG tablet TAKE 1 TABLET(12.5 MG) BY MOUTH THREE TIMES DAILY AS NEEDED FOR DIZZINESS (Patient taking differently: Take 12.5 mg by mouth 3 (three) times daily as needed for dizziness or nausea. ) 30 tablet 2  . metolazone (ZAROXOLYN) 2.5 MG tablet Take one tablet on Saturdays. (Patient taking differently: Take 2.5 mg by mouth every Saturday. ) 12 tablet 5  . metoprolol succinate (TOPROL-XL) 100 MG 24 hr tablet TAKE 1 TABLET DAILY WITH ORIMMEDIATELY FOLLOWING A    MEAL 90 tablet 2  . nitroGLYCERIN (NITROSTAT) 0.4 MG SL tablet Place 1 tablet (0.4 mg total) under the tongue every 5 (five) minutes as needed for chest pain. (Patient not taking: Reported on 04/16/2020) 30 tablet 0  . ondansetron (ZOFRAN ODT) 4 MG disintegrating tablet Take 1 tablet (4 mg total) by mouth every 8 (eight) hours as needed for nausea or vomiting. (Patient not taking: Reported on 04/16/2020) 10 tablet 0  . Polyethyl [name] (SYSTANE) 0.4-0.3 % GEL ophthalmic gel Place 1 application into the left eye at bedtime.    . polyethylene  glycol powder (GLYCOLAX/MIRALAX) 17 GM/SCOOP powder Take 17 g by mouth daily as needed for mild constipation or moderate constipation.    . potassium chloride SA (KLOR-CON) 20 MEQ tablet Take 1 tablet (20 mEq total) by mouth daily.    Marland Kitchen  pravastatin (PRAVACHOL) 40 MG tablet TAKE 1 TABLET BY MOUTH EVERY EVENING (Patient taking differently: Take 40 mg by mouth every evening. ) 90 tablet 3  . SYNTHROID 75 MCG tablet Take 1 tablet (75 mcg total) by mouth daily before breakfast.    . warfarin (JANTOVEN) 5 MG tablet Take 1-2 tablets (5-10 mg total) by mouth See admin instructions. TAKE 2 TABLETS EVERY DAY EXCEPT 1 TABLET ON TUESDAYS @ 8:30PM     No current facility-administered medications for this visit.   Facility-Administered Medications Ordered in Other Visits  Medication Dose Route Frequency Provider Last Rate Last Admin  . epoetin alfa (EPOGEN,PROCRIT) injection 40,000 Units  40,000 Units Subcutaneous Once Farrel Gobble, MD        Allergies:   Penicillins, Aspirin, Fentanyl, and Niacin    Social History:  The patient  reports that she quit smoking about 33 years ago. Her smoking use included cigarettes. She has a 30.00 pack-year smoking history. She has never used smokeless tobacco. She reports that she does not drink alcohol and does not use drugs.   Family History:  The patient's family history includes Diabetes in her brother and brother; Heart attack in her sister; Heart disease in her brother, brother, father, and sister; Hypertension in her brother; Lung cancer in her brother; Pancreatic cancer in her mother.    ROS: All other systems are reviewed and negative. Unless otherwise mentioned in H&P    PHYSICAL EXAM: VS:  There were no vitals taken for this visit. , BMI There is no height or weight on file to calculate BMI. GEN: Well nourished, well developed, in no acute distress HEENT: normal Neck: no JVD, carotid bruits, or masses Cardiac: ***RRR; no murmurs, rubs, or gallops,no  edema  Respiratory:  Clear to auscultation bilaterally, normal work of breathing GI: soft, nontender, nondistended, + BS MS: no deformity or atrophy Skin: warm and dry, no rash Neuro:  Strength and sensation are intact Psych: euthymic mood, full affect   EKG:  EKG {ACTION; IS/IS DXA:[id number]} ordered today. The ekg ordered today demonstrates ***   Recent Labs: 02/16/2020: TSH 2.32 03/30/2020: B Natriuretic Peptide 502.0 04/02/2020: Magnesium 1.8 04/14/2020: ALT 28; BUN 57; Creatinine, Ser 1.51; Hemoglobin 9.8; Platelets 202; Potassium 4.6; Sodium 135    Lipid Panel    Component Value Date/Time   CHOL 124 01/05/2020 1253   CHOL 165 10/16/2016 1213   TRIG 73 01/05/2020 1253   HDL 44 (L) 01/05/2020 1253   HDL 31 (L) 10/16/2016 1213   CHOLHDL 2.8 01/05/2020 1253   VLDL 12 07/07/2019 1354   LDLCALC 65 01/05/2020 1253      Wt Readings from Last 3 Encounters:  04/16/20 194 lb 8 oz (88.2 kg)  04/07/20 185 lb (83.9 kg)  04/02/20 191 lb 12.8 oz (87 kg)      Other studies Reviewed: Echocardiogram 02/09/20 1. Left ventricular ejection fraction, by estimation, is 30 to 35%. The  left ventricle has moderately decreased function. The left ventricle  demonstrates global hypokinesis. There is mild concentric left ventricular  hypertrophy. Left ventricular  diastolic function could not be evaluated.  2. Right ventricular systolic function is normal. The right ventricular  size is normal. There is severely elevated pulmonary artery systolic  pressure. The estimated right ventricular systolic pressure is 72.0 mmHg.  3. Left atrial size was severely dilated.  4. Right atrial size was moderately dilated.  5. The mitral valve has been repaired/replaced. No evidence of mitral  valve regurgitation.  6. The  aortic valve has been repaired/replaced. Aortic valve  regurgitation is not visualized.    ASSESSMENT AND PLAN:  1.  ***   Current medicines are reviewed at length with the  patient today.  I have spent *** dedicated to the care of this patient on the date of this encounter to include pre-visit review of records, assessment, management and diagnostic testing,with shared decision making.  Labs/ tests ordered today include: *** Phill Myron. [place] Pugh, ANP, AACC   04/19/2020 7:59 PM    [facility] [place] [address] Office [phone] Fax [phone]  Notice: This dictation was prepared with Dragon dictation along with smaller phrase technology. Any transcriptional errors that result from this process are unintentional and may not be corrected upon review.

## 2020-04-20 ENCOUNTER — Ambulatory Visit (INDEPENDENT_AMBULATORY_CARE_PROVIDER_SITE_OTHER): Payer: Medicare Other | Admitting: *Deleted

## 2020-04-20 ENCOUNTER — Telehealth: Payer: Self-pay | Admitting: *Deleted

## 2020-04-20 DIAGNOSIS — D631 Anemia in chronic kidney disease: Secondary | ICD-10-CM | POA: Diagnosis not present

## 2020-04-20 DIAGNOSIS — N184 Chronic kidney disease, stage 4 (severe): Secondary | ICD-10-CM | POA: Diagnosis not present

## 2020-04-20 DIAGNOSIS — Z5181 Encounter for therapeutic drug level monitoring: Secondary | ICD-10-CM | POA: Diagnosis not present

## 2020-04-20 DIAGNOSIS — Z952 Presence of prosthetic heart valve: Secondary | ICD-10-CM | POA: Diagnosis not present

## 2020-04-20 DIAGNOSIS — E1122 Type 2 diabetes mellitus with diabetic chronic kidney disease: Secondary | ICD-10-CM | POA: Diagnosis not present

## 2020-04-20 DIAGNOSIS — I130 Hypertensive heart and chronic kidney disease with heart failure and stage 1 through stage 4 chronic kidney disease, or unspecified chronic kidney disease: Secondary | ICD-10-CM | POA: Diagnosis not present

## 2020-04-20 DIAGNOSIS — I0981 Rheumatic heart failure: Secondary | ICD-10-CM | POA: Diagnosis not present

## 2020-04-20 DIAGNOSIS — I5043 Acute on chronic combined systolic (congestive) and diastolic (congestive) heart failure: Secondary | ICD-10-CM | POA: Diagnosis not present

## 2020-04-20 LAB — POCT INR: INR: 2.1 (ref 2.0–3.0)

## 2020-04-20 NOTE — Patient Instructions (Signed)
Continue warfarin 2 tablets daily except 1 tablet on Tuesdays  Continue greens Recheck in 1 week Order given to [employer] [facility]

## 2020-04-20 NOTE — Telephone Encounter (Signed)
Done.  See coumadin note. 

## 2020-04-20 NOTE — Telephone Encounter (Signed)
Per phone call from Janett Billow w/ [facility] [phone]  INR 2.1 AND PT 25.6

## 2020-04-21 ENCOUNTER — Ambulatory Visit (INDEPENDENT_AMBULATORY_CARE_PROVIDER_SITE_OTHER): Payer: Medicare Other | Admitting: Orthopaedic Surgery

## 2020-04-21 ENCOUNTER — Other Ambulatory Visit: Payer: Self-pay

## 2020-04-21 ENCOUNTER — Encounter: Payer: Self-pay | Admitting: Orthopaedic Surgery

## 2020-04-21 VITALS — Ht 67.0 in | Wt 194.0 lb

## 2020-04-21 DIAGNOSIS — M25561 Pain in right knee: Secondary | ICD-10-CM

## 2020-04-21 DIAGNOSIS — G8929 Other chronic pain: Secondary | ICD-10-CM | POA: Diagnosis not present

## 2020-04-21 DIAGNOSIS — M19012 Primary osteoarthritis, left shoulder: Secondary | ICD-10-CM | POA: Insufficient documentation

## 2020-04-21 MED ORDER — METHYLPREDNISOLONE ACETATE 40 MG/ML IJ SUSP
80.0000 mg | INTRAMUSCULAR | Status: AC | PRN
  Administered 2020-04-21: 80 mg via INTRA_ARTICULAR

## 2020-04-21 MED ORDER — BUPIVACAINE HCL 0.5 % IJ SOLN
2.0000 mL | INTRAMUSCULAR | Status: AC | PRN
  Administered 2020-04-21: 2 mL via INTRA_ARTICULAR

## 2020-04-21 MED ORDER — LIDOCAINE HCL 1 % IJ SOLN
2.0000 mL | INTRAMUSCULAR | Status: AC | PRN
  Administered 2020-04-21: 2 mL

## 2020-04-21 NOTE — Progress Notes (Signed)
EPIC Encounter for ICM Monitoring  Patient Name: [name] [name] is a 86 y.o. female Date: 04/21/2020 Primary Care Physican: Fayrene Helper, MD Primary Cardiologist:Croitoru Electrophysiologist:Taylor Bi-V Pacing:99.9% 03/17/2020 Weight:185.8lbs    Spoke with patient and reports feet are a little swollen today.  Hospitalized 7/6 - 7/9 for Acute on chronic congestive heart failure.   Optivol thoracic impedancenormal but was suggesting possible fluid accumulation from 03/24/2020 - 03/25/2020.  Prescribed:  Furosemide20 mg 3 tablets (60 mg total) by mouth daily.   Metolazaone 2.5 mg Take 1tabletoncea weekonSaturdays.   Potassium 20 mEq take 1 tablet daily.  Labs: 04/14/2020 Creatinine 1.51, BUN 57, Potassium 4.6, Sodium 135, GFR 31-36 04/02/2020 Creatinine 1.40, BUN 42, Potassium 4.3, Sodium 131, GFR 34-39  04/01/2020 Creatinine 1.40, BUN 38, Potassium 4.3, Sodium 131, GFR 34-39  03/31/2020 Creatinine 1.40, BUN 40, Potassium 5.0, Sodium 131, GFR 34-39  03/30/2020 Creatinine 1.50, BUN 44, Potassium 5.4, Sodium 130, GFR 31-36  03/14/2020 Creatinine 1.37, BUN 41, Potassium 5.0, Sodium 135, GFR 35-40 03/10/2020 Creatinine 1.28, BUN 46, Potassium 4.5, Sodium 137, GFR 38-44  02/16/2020 Creatinine 1.72, BUN 50, Potassium 5.1, Sodium 134, GFR 27-31  A complete set of results can be found in Results Review.  Recommendations:No changes and encouraged to call if experiencing any fluid symptoms.  Advised of importance of 7/30 office appointment to follow up post hospital.  Follow-up plan: ICM clinic phone appointment on8/30/2021.91 day device clinic remote transmission8/20/2021.  EP/Cardiology Office Visits: 04/23/2020 with Jory Sims, NP and 05/25/2020 for Dr Sallyanne Kuster.    Copy of ICM check sent to Dr. Lovena Le.   3 month ICM trend: 04/18/2020    1 Year ICM trend:       [name], RN 04/21/2020 8:31 AM

## 2020-04-21 NOTE — Progress Notes (Signed)
Office Visit Note   Patient: [name] [name]           Date of Birth: 1932-08-29           MRN: [id number] Visit Date: 04/21/2020              Requested by: [name] [name], [name] [name], [name] [name],  [name] [name] [id number] PCP: [name] Helper, MD   Assessment & Plan: Visit Diagnoses:  1. Chronic pain of right knee   2. Primary osteoarthritis, left shoulder     Plan: [name] was seen several weeks ago for evaluation of multiple joint complaints.  Her biggest problems with her left shoulder.  X-rays revealed end-stage osteoarthritis.  The shoulder was injected with cortisone and she is not sure it made much of a difference.  Long discussion regarding her diagnosis and treatment options including shoulder replacement.  I certainly do not think she is a candidate but it just reinforces the fact that she will have some chronic problems.  I had offered her physical therapy but she said she had some exercises.  The other issue is with her knees.  She had x-rays of her knees in the past demonstrating significant arthritis particularly in the lateral compartment of her right knee.  She is more symptomatic on the right and so I proceeded with a cortisone injection in the right knee.  She can return in the next 3 to 4 weeks to consider injecting the left knee if she wishes  Follow-Up Instructions: Return if symptoms worsen or fail to improve.   Orders:  Orders Placed This Encounter  Procedures  . Large Joint Inj: R knee   No orders of the defined types were placed in this encounter.     Procedures: Large Joint Inj: R knee on 04/21/2020 1:39 PM Indications: pain and diagnostic evaluation Details: 25 G 1.5 in needle, anteromedial approach  Arthrogram: No  Medications: 2 mL lidocaine 1 %; 2 mL bupivacaine 0.5 %; 80 mg methylPREDNISolone acetate 40 MG/ML Procedure, treatment alternatives, risks and benefits explained, specific risks discussed. Consent was given by the  patient. Immediately prior to procedure a time out was called to verify the correct patient, procedure, equipment, support staff and site/side marked as required. Patient was prepped and draped in the usual sterile fashion.       Clinical Data: No additional findings.   Subjective: Chief Complaint  Patient presents with  . Right Knee - Pain, Follow-up  . Left Shoulder - Follow-up, Pain  Patient returns for two week follow up left shoulder and right knee pain. She states that she did not get any relief from the left shoulder injection given on 04/07/2020.  She continues to have pain. At that visit, right knee injection was discussed, however, patient is unsure if this will give her any relief since the shoulder injection did not. She takes Tylenol Extra Strength as needed for pain with relief. She ambulates with a cane.  Multiple medical comorbidities.  Uses supplemental oxygen.  In the past had x-rays of both of her knees demonstrating significant osteoarthritis.  She is more symptomatic on the right where there was bone-on-bone in the lateral compartment.  HPI  Review of Systems   Objective: Vital Signs: Ht 5\' 7"  (1.702 m)   Wt 194 lb (88 kg)   BMI 30.38 kg/m   Physical Exam Constitutional:      Appearance: She is well-developed.  Eyes:     Pupils: Pupils  are equal, round, and reactive to light.  Pulmonary:     Effort: Pulmonary effort is normal.  Skin:    General: Skin is warm and dry.  Neurological:     Mental Status: She is alert and oriented to person, place, and time.  Psychiatric:        Behavior: Behavior normal.     Ortho Exam awake alert and oriented x3.  Evaluated in a wheelchair.  Assistant accompanies her.  She does use nasal oxygen.  Limited overhead range of motion left shoulder with crepitation consistent with her osteoarthritis.  Does have some lateral joint pain in her right knee which is the more symptomatic.  Prior films demonstrated significant  lateral compartment arthritis.  Some patellar crepitation.  I was able to just about fully extend her knee and flex about 100 degrees without instability.  Specialty Comments:  No specialty comments available.  Imaging: No results found.   PMFS History: Patient Active Problem List   Diagnosis Date Noted  . Primary osteoarthritis, left shoulder 04/21/2020  . Acute systolic CHF (congestive heart failure) ([name] [name]) 03/31/2020  . Bilateral lower extremity edema   . Acute on chronic congestive heart failure ([name] [name]) 03/30/2020  . Right-sided epistaxis 03/28/2020  . Acute pain of left shoulder 01/15/2020  . AAA (abdominal aortic aneurysm) without rupture ([name] [name]) 01/14/2020  . COVID-19 virus detected 11/23/2019  . Dyspnea 05/30/2019  . Anemia 05/30/2019  . Gout   . Glaucoma   . GERD (gastroesophageal reflux disease)   . Physical debility 04/28/2019  . Encounter for examination following treatment at hospital 11/03/2018  . Hyponatremia 09/27/2018  . H/O mitral valve replacement with mechanical valve 08/02/2018  . Chronic right hip pain 04/16/2018  . Chronic systolic congestive heart failure ([place]) 11/15/2017  . Trochanteric bursitis, right hip 03/07/2017  . Persistent atrial fibrillation ([place])   . Nocturnal hypoxia 10/08/2016  . Dependence on nocturnal oxygen therapy 10/08/2016  . Hematuria 03/20/2016  . S/P ICD (internal cardiac defibrillator) procedure 02/08/2015  . Presence of cardiac pacemaker 02/08/2015  . CHB (complete heart block) ([name] [name]) 10/07/2014  . Fatigue 07/20/2014  . Absolute anemia 06/24/2014  . Osteopenia 02/10/2014  . Encounter for therapeutic drug monitoring 10/29/2013  . OA (osteoarthritis) of knee 02/19/2013  . Chronic pain of right knee 01/02/2013  . Hemolytic anemia ([name] [name]) 09/24/2012  . High risk medication use 05/24/2012  . COPD (chronic obstructive pulmonary disease) ([name] [name]) 05/19/2012  . Chronic anticoagulation, on coumadin for Mechanical AVR and MVR 04/01/2012  .  Cardiorenal syndrome with renal failure 03/22/2012  . Hx of aortic valve replacement, mechanical 03/22/2012  . S/P MVR (mitral valve replacement) 03/22/2012  . Biventricular automatic implantable cardioverter defibrillator in situ 03/22/2012  . CKD (chronic kidney disease), stage IV ([place]) 03/22/2012  . Warfarin-induced coagulopathy ([place]) 03/21/2012  . Iron deficiency 10/04/2011  . Allergic rhinitis 02/14/2011  . Hypothyroidism 07/15/2010  . Mitral valve disease 07/15/2010  . Sinus node dysfunction ([place]) 07/15/2010  . VARICOSE VEINS LOWER EXTREMITIES W/OTH COMPS 02/09/2009  . Prediabetes 06/15/2008  . Hypothyroidism, postsurgical 02/24/2008  . Hyperlipidemia LDL goal <70 02/24/2008  . Essential hypertension 02/24/2008  . Non-ischemic cardiomyopathy with ICD 02/24/2008  . Spondylosis 02/24/2008   Past Medical History:  Diagnosis Date  . Acute on chronic diastolic CHF (congestive heart failure) ([place]) 05/19/2012  . Acute on chronic respiratory failure with hypoxia ([place]) 01/31/2020  . Adenomatous polyp of colon 12/16/2002   Dr. [name]/[facility]  . Allergy   .  Calculus of gallbladder with chronic cholecystitis without obstruction 02/09/2009   Qualifier: Diagnosis of  By: [name], [name]    . Cataract   . CHB (complete heart block) ([place]) 10/07/2014      . CHF (congestive heart failure) ([place])    a.  EF previously reduced to 25-30% b. EF improved to 60-65% by echo in 10/2017.   Marland Kitchen Chronic kidney disease, stage I 2006  . Constipation       . COPD (chronic obstructive pulmonary disease) ([place])       . DJD (degenerative joint disease) of lumbar spine   . DM (diabetes mellitus) ([place]) 2006  . Esophageal reflux   . Glaucoma   . Gout       . H/O adenomatous polyp of colon 05/24/2012   2004 in [place] colonoscopy 2008 normal    . H/O heart valve replacement with mechanical valve    a. s/p mechanical AVR in 2001 and mechanical MVR in 2004.  Marland Kitchen H/O  wisdom tooth extraction   . HIP PAIN, RIGHT 04/23/2009   Qualifier: Diagnosis of  By: [name], [name]    . Hyperlipemia   . IDA (iron deficiency anemia)    Parenteral iron/[name]  . Insomnia       . Nausea without vomiting 06/07/2010   Qualifier: Diagnosis of  By: [name], [name]    . Non-ischemic cardiomyopathy ([place])    a. s/p MDT CRTD  . Obesity, unspecified   . Other and unspecified hyperlipidemia   . Oxygen deficiency 2014   nocturnal;  . Papilloma of breast 03/06/2012   This was diagnosed as a left breast papilloma by ultrasound characteristics and symptoms in 2010. Because of possible medical risk factors no surgical intervention has been done and it did doing annual followups. The area has been stable by physical examination and mammograms and ultrasound since 2010.   Marland Kitchen Spinal stenosis of lumbar region 02/19/2013  . Spondylolisthesis   . Spondylosis   . Thyroid cancer ([place])    remote thyroidectomy, no recurrence, pt denies in 2016 thyroid cancer  . Type 2 diabetes mellitus with hyperglycemia ([place]) 07/15/2010  . Unspecified hypothyroidism       . 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 Vertigo         Family History  Problem Relation Age of Onset  . Pancreatic cancer Mother   . Heart disease Father   . Heart disease Sister   . Heart attack Sister   . Heart disease Brother   . Diabetes Brother   . Heart disease Brother   . Diabetes Brother   . Hypertension Brother   . Lung cancer Brother     Past Surgical History:  Procedure Laterality Date  . AORTIC AND MITRAL VALVE REPLACEMENT  2001  . BI-VENTRICULAR IMPLANTABLE CARDIOVERTER DEFIBRILLATOR UPGRADE N/A 01/18/2015   Procedure: BI-VENTRICULAR IMPLANTABLE CARDIOVERTER DEFIBRILLATOR UPGRADE;  Surgeon: Evans Lance, MD;  Location: [facility] CATH LAB;  Service: Cardiovascular;  Laterality: N/A;  . BIV ICD GENERATOR CHANGEOUT N/A 01/30/2019   Procedure: BIV ICD GENERATOR CHANGEOUT;   Surgeon: Evans Lance, MD;  Location: [place] CV LAB;  Service: Cardiovascular;  Laterality: N/A;  . CARDIAC CATHETERIZATION    . CARDIAC VALVE REPLACEMENT    . CARDIOVERSION N/A 11/13/2016   Procedure: CARDIOVERSION;  Surgeon: Sanda Klein, MD;  Location: [facility] ENDOSCOPY;  Service: Cardiovascular;  Laterality: N/A;  . COLONOSCOPY  06/12/2007   Rourk- Normal rectum/Normal  colon  . COLONOSCOPY  12/16/02   small hemorrhoids  . COLONOSCOPY  06/20/2012   Procedure: COLONOSCOPY;  Surgeon: Daneil Dolin, MD;  Location: [facility] ENDO SU[facility];  Service: Endoscopy;  Laterality: N/A;  10:45  . defibrillator implanted 2006    . DOPPLER ECHOCARDIOGRAPHY  2012  . DOPPLER ECHOCARDIOGRAPHY  05,06,07,08,09,2011  . ESOPHAGOGASTRODUODENOSCOPY  06/12/2007   Rourk- normal esophagus, small hiatal hernia, otherwise normal stomach, D1, D2  . EYE SURGERY Right 2005  . EYE SURGERY Left 2006  . ICD LEAD REMOVAL Left 02/08/2015   Procedure: ICD LEAD REMOVAL;  Surgeon: Evans Lance, MD;  Location: [facility] OR;  Service: Cardiovascular;  Laterality: Left;  "Will plan extraction and insertion of a BiV PM"  **Dr. Roxan Hockey backing up case**  . IMPLANTABLE CARDIOVERTER DEFIBRILLATOR (ICD) GENERATOR CHANGE Left 02/08/2015   Procedure: ICD GENERATOR CHANGE;  Surgeon: Evans Lance, MD;  Location: [place];  Service: Cardiovascular;  Laterality: Left;  . INSERT / REPLACE / REMOVE PACEMAKER    . PACEMAKER INSERTION  May 2016  . pacemaker placed  2004  . right breast cyst removed     benign   . right cataract removed     2005  . TEE WITHOUT CARDIOVERSION N/A 11/13/2016   Procedure: TRANSESOPHAGEAL ECHOCARDIOGRAM (TEE);  Surgeon: Sanda Klein, MD;  Location: [facility] ENDOSCOPY;  Service: Cardiovascular;  Laterality: N/A;  . THYROIDECTOMY     Social History   Occupational History  . Occupation: retired    Fish farm manager: RETIRED  Tobacco Use  . Smoking status: Former Smoker    Packs/day: 0.75    Years: 40.00    Pack years: 30.00     Types: Cigarettes    Quit date: 03/08/1987    Years since quitting: 33.1  . Smokeless tobacco: Never Used  Vaping Use  . Vaping Use: Never used  Substance and Sexual Activity  . Alcohol use: No    Alcohol/week: 0.0 standard drinks  . Drug use: No  . Sexual activity: Not Currently

## 2020-04-22 ENCOUNTER — Other Ambulatory Visit: Payer: Self-pay

## 2020-04-22 ENCOUNTER — Ambulatory Visit (INDEPENDENT_AMBULATORY_CARE_PROVIDER_SITE_OTHER): Payer: Medicare Other | Admitting: Family Medicine

## 2020-04-22 ENCOUNTER — Encounter: Payer: Self-pay | Admitting: Family Medicine

## 2020-04-22 VITALS — BP 145/75 | Resp 16 | Ht 67.0 in | Wt 197.0 lb

## 2020-04-22 DIAGNOSIS — R5381 Other malaise: Secondary | ICD-10-CM

## 2020-04-22 DIAGNOSIS — N184 Chronic kidney disease, stage 4 (severe): Secondary | ICD-10-CM | POA: Diagnosis not present

## 2020-04-22 DIAGNOSIS — I5022 Chronic systolic (congestive) heart failure: Secondary | ICD-10-CM

## 2020-04-22 DIAGNOSIS — Z09 Encounter for follow-up examination after completed treatment for conditions other than malignant neoplasm: Secondary | ICD-10-CM

## 2020-04-22 DIAGNOSIS — I130 Hypertensive heart and chronic kidney disease with heart failure and stage 1 through stage 4 chronic kidney disease, or unspecified chronic kidney disease: Secondary | ICD-10-CM | POA: Diagnosis not present

## 2020-04-22 DIAGNOSIS — I0981 Rheumatic heart failure: Secondary | ICD-10-CM | POA: Diagnosis not present

## 2020-04-22 DIAGNOSIS — E1122 Type 2 diabetes mellitus with diabetic chronic kidney disease: Secondary | ICD-10-CM | POA: Diagnosis not present

## 2020-04-22 DIAGNOSIS — I5043 Acute on chronic combined systolic (congestive) and diastolic (congestive) heart failure: Secondary | ICD-10-CM | POA: Diagnosis not present

## 2020-04-22 DIAGNOSIS — M159 Polyosteoarthritis, unspecified: Secondary | ICD-10-CM

## 2020-04-22 DIAGNOSIS — D631 Anemia in chronic kidney disease: Secondary | ICD-10-CM | POA: Diagnosis not present

## 2020-04-22 NOTE — Patient Instructions (Addendum)
F/U as before, call if you need me before  Need to weight daily, and if you gain more than 2 pounds then you need ro cut back salt and water  Limit water to  2 liters every 24 hours   Heart Failure Eating Plan Heart failure, also called congestive heart failure, occurs when your heart does not pump blood well enough to meet your body's needs for oxygen-rich blood. Heart failure is a long-term (chronic) condition. Living with heart failure can be challenging. However, following your health care provider's instructions about a healthy lifestyle and working with a diet and nutrition specialist (dietitian) to choose the right foods may help to improve your symptoms. What are tips for following this plan? Reading food labels  Check food labels for the amount of sodium per serving. Choose foods that have less than 140 mg (milligrams) of sodium in each serving.  Check food labels for the number of calories per serving. This is important if you need to limit your daily calorie intake to lose weight.  Check food labels for the serving size. If you eat more than one serving, you will be eating more sodium and calories than what is listed on the label.  Look for foods that are labeled as "sodium-free," "very low sodium," or "low sodium." ? Foods labeled as "reduced sodium" or "lightly salted" may still have more sodium than what is recommended for you. Cooking  Avoid adding salt when cooking. Ask your health care provider or dietitian before using salt substitutes.  Season food with salt-free seasonings, spices, or herbs. Check the label of seasoning mixes to make sure they do not contain salt.  Cook with heart-healthy oils, such as olive, canola, soybean, or sunflower oil.  Do not fry foods. Cook foods using low-fat methods, such as baking, boiling, grilling, and broiling.  Limit unhealthy fats when cooking by: ? Removing the skin from poultry, such as chicken. ? Removing all visible fats from  meats. ? Skimming the fat off from stews, soups, and gravies before serving them. Meal planning   Limit your intake of: ? Processed, canned, or pre-packaged foods. ? Foods that are high in trans fat, such as fried foods. ? Sweets, desserts, sugary drinks, and other foods with added sugar. ? Full-fat dairy products, such as whole milk.  Eat a balanced diet that includes: ? 4-5 servings of fruit each day and 4-5 servings of vegetables each day. At each meal, try to fill half of your plate with fruits and vegetables. ? Up to 6-8 servings of whole grains each day. ? Up to 2 servings of lean meat, poultry, or fish each day. One serving of meat is equal to 3 oz. This is about the same size as a deck of cards. ? 2 servings of low-fat dairy each day. ? Heart-healthy fats. Healthy fats called omega-3 fatty acids are found in foods such as flaxseed and cold-water fish like sardines, salmon, and mackerel.  Aim to eat 25-35 g (grams) of fiber a day. Foods that are high in fiber include apples, broccoli, carrots, beans, peas, and whole grains.  Do not add salt or condiments that contain salt (such as soy sauce) to foods before eating.  When eating at a restaurant, ask that your food be prepared with less salt or no salt, if possible.  Try to eat 2 or more vegetarian meals each week.  Eat more home-cooked food and eat less restaurant, buffet, and fast food. General information  Do not eat  more than 2,300 mg of salt (sodium) a day. The amount of sodium that is recommended for you may be lower, depending on your condition.  Maintain a healthy body weight as directed. Ask your health care provider what a healthy weight is for you. ? Check your weight every day. ? Work with your health care provider and dietitian to make a plan that is right for you to lose weight or maintain your current weight.  Limit how much fluid you drink. Ask your health care provider or dietitian how much fluid you can have  each day.  Limit or avoid alcohol as told by your health care provider or dietitian. Recommended foods The items listed may not be a complete list. Talk with your dietitian about what dietary choices are best for you. Fruits All fresh, frozen, and canned fruits. Dried fruits, such as raisins, prunes, and cranberries. Vegetables All fresh vegetables. Vegetables that are frozen without sauce or added salt. Low-sodium or sodium-free canned vegetables. Grains Bread with less than 80 mg of sodium per slice. Whole-wheat pasta, quinoa, and brown rice. Oats and oatmeal. Barley. [place]. Grits and cream of wheat. Whole-grain and whole-wheat cold cereal. Meats and other protein foods Lean cuts of meat. Skinless chicken and [place]. Fish with high omega-3 fatty acids, such as salmon, sardines, and other cold-water fishes. Eggs. Dried beans, peas, and edamame. Unsalted nuts and nut butters. Dairy Low-fat or nonfat (skim) milk and dried milk. Rice milk, soy milk, and almond milk. Low-fat or nonfat yogurt. Small amounts of reduced-sodium block cheese. Low-sodium cottage cheese. Fats and oils Olive, canola, soybean, flaxseed, or sunflower oil. Avocado. Sweets and desserts Apple sauce. Granola bars. Sugar-free pudding and gelatin. Frozen fruit bars. Seasoning and other foods Fresh and dried herbs. Lemon or lime juice. Vinegar. Low-sodium ketchup. Salt-free marinades, salad dressings, sauces, and seasonings. The items listed above may not be a complete list of foods and beverages you can eat. Contact a dietitian for more information. Foods to avoid The items listed may not be a complete list. Talk with your dietitian about what dietary choices are best for you. Fruits Fruits that are dried with sodium-containing preservatives. Vegetables Canned vegetables. Frozen vegetables with sauce or seasonings. Creamed vegetables. [place] fries. Onion rings. Pickled vegetables and sauerkraut. Grains Bread with more  than 80 mg of sodium per slice. Hot or cold cereal with more than 140 mg sodium per serving. Salted pretzels and crackers. Pre-packaged breadcrumbs. Bagels, croissants, and biscuits. Meats and other protein foods Ribs and chicken wings. Bacon, ham, pepperoni, bologna, salami, and packaged luncheon meats. Hot dogs, bratwurst, and sausage. Canned meat. Smoked meat and fish. Salted nuts and seeds. Dairy Whole milk, half-and-half, and cream. Buttermilk. Processed cheese, cheese spreads, and cheese curds. Regular cottage cheese. Feta cheese. Shredded cheese. String cheese. Fats and oils Butter, lard, shortening, ghee, and bacon fat. Canned and packaged gravies. Seasoning and other foods Onion salt, garlic salt, table salt, and sea salt. Marinades. Regular salad dressings. Relishes, pickles, and olives. Meat flavorings and tenderizers, and bouillon cubes. Horseradish, ketchup, and mustard. Worcestershire sauce. Teriyaki sauce, soy sauce (including reduced sodium). Hot sauce and Tabasco sauce. Steak sauce, fish sauce, oyster sauce, and cocktail sauce. Taco seasonings. Barbecue sauce. Tartar sauce. The items listed above may not be a complete list of foods and beverages you should avoid. Contact a dietitian for more information. Summary  A heart failure eating plan includes changes that limit your intake of sodium and unhealthy fat, and it may help  you lose weight or maintain a healthy weight. Your health care provider may also recommend limiting how much fluid you drink.  Most people with heart failure should eat no more than 2,300 mg of salt (sodium) a day. The amount of sodium that is recommended for you may be lower, depending on your condition.  Contact your health care provider or dietitian before making any major changes to your diet. This information is not intended to replace advice given to you by your health care provider. Make sure you discuss any questions you have with your health care  provider. Document Revised: 11/07/2018 Document Reviewed: 01/26/2017 Elsevier Patient Education  [employer].

## 2020-04-23 ENCOUNTER — Ambulatory Visit (INDEPENDENT_AMBULATORY_CARE_PROVIDER_SITE_OTHER): Payer: Medicare Other | Admitting: General Practice

## 2020-04-23 ENCOUNTER — Encounter: Payer: Self-pay | Admitting: General Practice

## 2020-04-23 ENCOUNTER — Ambulatory Visit: Payer: Medicare Other | Admitting: Adult Health

## 2020-04-23 VITALS — BP 136/79 | HR 60 | Ht 67.0 in | Wt 195.6 lb

## 2020-04-23 DIAGNOSIS — Z952 Presence of prosthetic heart valve: Secondary | ICD-10-CM | POA: Diagnosis not present

## 2020-04-23 DIAGNOSIS — I5042 Chronic combined systolic (congestive) and diastolic (congestive) heart failure: Secondary | ICD-10-CM | POA: Diagnosis not present

## 2020-04-23 DIAGNOSIS — N184 Chronic kidney disease, stage 4 (severe): Secondary | ICD-10-CM | POA: Diagnosis not present

## 2020-04-23 DIAGNOSIS — N1832 Chronic kidney disease, stage 3b: Secondary | ICD-10-CM | POA: Diagnosis not present

## 2020-04-23 DIAGNOSIS — I4819 Other persistent atrial fibrillation: Secondary | ICD-10-CM | POA: Diagnosis not present

## 2020-04-23 DIAGNOSIS — I442 Atrioventricular block, complete: Secondary | ICD-10-CM

## 2020-04-23 DIAGNOSIS — Z9889 Other specified postprocedural states: Secondary | ICD-10-CM | POA: Diagnosis not present

## 2020-04-23 MED ORDER — POTASSIUM CHLORIDE CRYS ER 20 MEQ PO TBCR: 20.0000 meq | EXTENDED_RELEASE_TABLET | Freq: Every day | ORAL | Status: DC

## 2020-04-23 NOTE — Progress Notes (Signed)
Thanks, Denyse Amass, she decompensates easily

## 2020-04-23 NOTE — Patient Instructions (Addendum)
Medication Instructions:  Take FUROSEMIDE 80mg  TWICE DAILY x3DAYS THEN BACK TO 40MG  TWICE DAILY  TAKE 40MEQ DAILY x3 DAYS THEN BACK TO 20MEQ DAILY  *If you need a refill on your cardiac medications before your next appointment, please call your pharmacy*  Lab Work: BMET IN 1 WEEK (04-30-2020)AT [facility] HERE IN OUR OFFICE  If you have labs (blood work) drawn today and your tests are completely normal, you will receive your results only by:  MyChart Message (if you have MyChart) OR A paper copy in the mail.  If you have any lab test that is abnormal or we need to change your treatment, we will call you to review the results. You may go to any [facility] that is convenient for you however, we do have a lab in our office that is able to assist you. You DO NOT need an appointment for our lab. The lab is open 8:00am and closes at 4:00pm. Lunch 12:45 - 1:45pm.  Special Instructions TAKE AND LOG YOUR WEIGHT DAILY AND BRING LOG WITH YOU TO FOLLOW UP APPOINTMENT IN 1 WEEK, PLEASE ELEVATE YOU LEGS WHEN SITTING  PLEASE INCREASE PHYSICAL ACTIVITY WITH WALKER AS TOLERATED  Follow-Up: Your next appointment:  1 week(s)  In Person with You may see Sanda Klein, MD, Curt Bears, DNP OR [place], FNP-C or one of the following Advanced Practice Providers on your designated Care Team:  [place], PA-C  Fabian Sharp, PA-C or [employer], [place].  At [facility], you and your health needs are our priority.  As part of our continuing mission to provide you with exceptional heart care, we have created designated Provider Care Teams.  These Care Teams include your primary Cardiologist (physician) and Advanced Practice Providers (APPs -  Physician Assistants and Nurse Practitioners) who all work together to provide you with the care you need, when you need it.  We recommend signing up for the patient portal called "MyChart".  Sign up information is provided on this After Visit Summary.  MyChart is used to connect with  patients for Virtual Visits (Telemedicine).  Patients are able to view lab/test results, encounter notes, upcoming appointments, etc.  Non-urgent messages can be sent to your provider as well.   To learn more about what you can do with MyChart, go to [email or web address].

## 2020-04-23 NOTE — Progress Notes (Signed)
Cardiology Clinic Note   Patient Name: [name] [name] Date of Encounter: 04/23/2020  Primary Care Provider:  Fayrene Helper, MD Primary Cardiologist:  [name] Klein, MD  Patient Profile    [name] [name] 86 year old female presents today for follow-up evaluation of her essential hypertension, nonischemic cardiomyopathy, complete heart block, and persistent atrial fibrillation.  Past Medical History    Past Medical History:  Diagnosis Date  . Acute on chronic diastolic CHF (congestive heart failure) ([place]) 05/19/2012  . Acute on chronic respiratory failure with hypoxia ([place]) 01/31/2020  . Adenomatous polyp of colon 12/16/2002   Dr. Collier Salina [name]/[facility]  . Allergy   . Calculus of gallbladder with chronic cholecystitis without obstruction 02/09/2009   Qualifier: Diagnosis of  By: [name] Cipro MD, [name] [name]    . Cataract   . CHB (complete heart block) ([place]) 10/07/2014      . CHF (congestive heart failure) ([place])    a.  EF previously reduced to 25-30% b. EF improved to 60-65% by echo in 10/2017.   Marland Kitchen Chronic kidney disease, stage I 2006  . Constipation       . COPD (chronic obstructive pulmonary disease) ([place])       . DJD (degenerative joint disease) of lumbar spine   . DM (diabetes mellitus) ([place]) 2006  . Esophageal reflux   . Glaucoma   . Gout       . H/O adenomatous polyp of colon 05/24/2012   2004 in [place] colonoscopy 2008 normal    . H/O heart valve replacement with mechanical valve    a. s/p mechanical AVR in 2001 and mechanical MVR in 2004.  Marland Kitchen H/O wisdom tooth extraction   . HIP PAIN, RIGHT 04/23/2009   Qualifier: Diagnosis of  By: [name] Cipro MD, [name] [name]    . Hyperlipemia   . IDA (iron deficiency anemia)    Parenteral iron/Dr. Tressie [name]  . Insomnia       . Nausea without vomiting 06/07/2010   Qualifier: Diagnosis of  By: [name] Cipro MD, [name] [name]    . Non-ischemic cardiomyopathy ([place])    a. s/p MDT CRTD  . Obesity, unspecified   . Other  and unspecified hyperlipidemia   . Oxygen deficiency 2014   nocturnal;  . Papilloma of breast 03/06/2012   This was diagnosed as a left breast papilloma by ultrasound characteristics and symptoms in 2010. Because of possible medical risk factors no surgical intervention has been done and it did doing annual followups. The area has been stable by physical examination and mammograms and ultrasound since 2010.   Marland Kitchen Spinal stenosis of lumbar region 02/19/2013  . Spondylolisthesis   . Spondylosis   . Thyroid cancer ([place])    remote thyroidectomy, no recurrence, pt denies in 2016 thyroid cancer  . Type 2 diabetes mellitus with hyperglycemia ([place]) 07/15/2010  . Unspecified hypothyroidism       . 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 Vertigo        Past Surgical History:  Procedure Laterality Date  . AORTIC AND MITRAL VALVE REPLACEMENT  2001  . BI-VENTRICULAR IMPLANTABLE CARDIOVERTER DEFIBRILLATOR UPGRADE N/A 01/18/2015   Procedure: BI-VENTRICULAR IMPLANTABLE CARDIOVERTER DEFIBRILLATOR UPGRADE;  Surgeon: [name] Lance, MD;  Location: [facility] CATH LAB;  Service: Cardiovascular;  Laterality: N/A;  . BIV ICD GENERATOR CHANGEOUT N/A 01/30/2019   Procedure: BIV ICD GENERATOR CHANGEOUT;  Surgeon: [name] Lance, MD;  Location: [facility] CV LAB;  Service: Cardiovascular;  Laterality: N/A;  .  CARDIAC CATHETERIZATION    . CARDIAC VALVE REPLACEMENT    . CARDIOVERSION N/A 11/13/2016   Procedure: CARDIOVERSION;  Surgeon: [name] Klein, MD;  Location: [facility] ENDOSCOPY;  Service: Cardiovascular;  Laterality: N/A;  . COLONOSCOPY  06/12/2007   Rourk- Normal rectum/Normal colon  . COLONOSCOPY  12/16/02   small hemorrhoids  . COLONOSCOPY  06/20/2012   Procedure: COLONOSCOPY;  Surgeon: [name] Dolin, MD;  Location: [facility] ENDO [facility];  Service: Endoscopy;  Laterality: N/A;  10:45  . defibrillator implanted 2006    . DOPPLER ECHOCARDIOGRAPHY  2012  . DOPPLER ECHOCARDIOGRAPHY  05,06,07,08,09,2011  .  ESOPHAGOGASTRODUODENOSCOPY  06/12/2007   Rourk- normal esophagus, small hiatal hernia, otherwise normal stomach, D1, D2  . EYE SURGERY Right 2005  . EYE SURGERY Left 2006  . ICD LEAD REMOVAL Left 02/08/2015   Procedure: ICD LEAD REMOVAL;  Surgeon: [name] Lance, MD;  Location: [facility] OR;  Service: Cardiovascular;  Laterality: Left;  "Will plan extraction and insertion of a BiV PM"  **Dr. Roxan [name] backing up case**  . IMPLANTABLE CARDIOVERTER DEFIBRILLATOR (ICD) GENERATOR CHANGE Left 02/08/2015   Procedure: ICD GENERATOR CHANGE;  Surgeon: [name] Lance, MD;  Location: [place];  Service: Cardiovascular;  Laterality: Left;  . INSERT / REPLACE / REMOVE PACEMAKER    . PACEMAKER INSERTION  May 2016  . pacemaker placed  2004  . right breast cyst removed     benign   . right cataract removed     2005  . TEE WITHOUT CARDIOVERSION N/A 11/13/2016   Procedure: TRANSESOPHAGEAL ECHOCARDIOGRAM (TEE);  Surgeon: [name] Klein, MD;  Location: [facility] ENDOSCOPY;  Service: Cardiovascular;  Laterality: N/A;  . THYROIDECTOMY      Allergies  Allergies  Allergen Reactions  . Penicillins Other (See Comments)    Bruise    . Aspirin Other (See Comments)    On coumadin  . Fentanyl Dermatitis    Rash with patch   . Niacin Itching    History of Present Illness    Ms. [name] is a 86 y.o. 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   She was seen for cardiology consult on 02/02/2020 while hospitalized for CHF and atrial fibrillation.  Repeat echocardiogram showed an EF  of 30 to 35% with global hypokinesis and severely elevated PASP of 71.2 mmHg.  She was treated with IV Lasix.  During consult she was felt to be euvolemic.  IV Lasix was discontinued and she was started on p.o. Lasix.  She was continued on beta-blocker, hydralazine, and  strict INO's and renal labs.  She was to continue on O2 via nasal cannula.  She was discharged on 02/03/2020.   She was readmitted on 04/02/2020 with complaints of bilateral lower extremity edema worsening over 3 to 4 days with worsening breathing.  She was given IV Lasix  x1 and admitted for further observation.  Dr. Domenic Polite, saw her on consult and she was advised to continue IV Lasix until she was euvolemic, then increase Lasix to 60 mg daily on discharge.  She was to continue O2 via nasal cannula at 2 L.  She  was instructed to return to her home medications and anticoagulation therapy.     She contacted our office on 04/09/2020 with complaints of weight gain of 5 pounds edema in both ankles.  She was advised to take metolazone as she has only been taking it once weekly on Saturdays.  She was advised to take an extra dose on day of phone call.  Lasix was increased to 80 mg daily until follow-up.  She presents to the clinic today for follow-up evaluation and states she feels that she has been losing more weight over the last few days.  Her daughter indicates that her ankles do look somewhat better however, during her recent hospitalization she had no lower extremity swelling.  Her breathing is stable today and she continues on 2 L.  She states that she cannot wear lower extremity support stockings because she cannot apply them.  She does state that she uses her recliner and elevates her legs through the day.  We discussed the appropriate height for elevation.  I will increase her furosemide to 80 mg x 3 days and her potassium to 40 mEq x 3 days, then resume her normal dosing.  I will order a BMP in 1 week and have her follow-up in a week.  I  will also give her the salty 6 diet sheet.    Today she denies chest pain, shortness of breath, increased lower extremity edema, fatigue, palpitations, melena, hematuria, hemoptysis, diaphoresis, weakness, presyncope, syncope, orthopnea, and PND.   Home Medications    Prior to Admission medications   Medication Sig Start Date End Date Taking? Authorizing Provider  acetaminophen (TYLENOL) 500 MG tablet Take 500 mg by mouth every 6 (six) hours as needed (pain).     [provider]  albuterol (PROVENTIL) (2.5 MG/3ML) 0.083% nebulizer solution Take 3 mLs (2.5 mg total) by nebulization in the morning and at bedtime. *May use additional one time if needed for shortness of breath 04/02/20   Irwin Brakeman L, MD  albuterol (VENTOLIN HFA) 108 (90 Base) MCG/ACT inhaler Inhale 1-2 puffs into the lungs every 6 (six) hours as needed for wheezing or shortness of breath. 04/02/20   Johnson, Clanford L, MD  allopurinol (ZYLOPRIM) 100 MG tablet Take 1 tablet (100 mg total) by mouth in the morning. 04/02/20   Johnson, Clanford L, MD  BREO ELLIPTA 100-25 MCG/INH AEPB 1 TAKE 1 INHALATION BY MOUTH ONCE DAILY Patient taking differently: Inhale 1 puff into the lungs in the morning.  08/25/19   [name] Helper, MD  cholecalciferol (VITAMIN D3) 25 MCG (1000 UT) tablet Take 1,000 Units by mouth in the morning.     [provider]  docusate sodium (COLACE) 100 MG capsule Take 300 mg by mouth at bedtime.     [provider]  fluticasone (FLONASE) 50 MCG/ACT nasal spray SHAKE LIQUID AND USE 2 SPRAYS IN EACH NOSTRIL DAILY Patient taking differently: Place 2 sprays into both nostrils every morning.  11/24/19   [name] Helper, MD  folic acid (FOLVITE) 1 MG tablet TAKE 1 TABLET(1 MG) BY MOUTH DAILY Patient taking differently: Take 1 mg by mouth every evening.  11/10/19   [name] Helper, MD  furosemide (LASIX) 20 MG tablet Take 3 tablets (60 mg total) by mouth daily. 04/02/20 06/01/20  Johnson,  Clanford L, MD  gabapentin (NEURONTIN) 300 MG capsule Take 1 capsule (300 mg total) by mouth 2 (two) times daily. 03/10/20   [name] Helper, MD  hydrALAZINE (APRESOLINE) 25  MG tablet Take 1 tablet (25 mg total) by mouth 2 (two) times daily. 05/19/19   Croitoru, Mihai, MD  isosorbide mononitrate (IMDUR) 30 MG 24 hr tablet Take 0.5 tablets (15 mg total) by mouth every morning. 02/04/20   Pokhrel, Corrie Mckusick, MD  Liniments (SALONPAS PAIN RELIEF PATCH EX) Apply 1 patch topically daily as needed (pain).    [provider]  meclizine (ANTIVERT) 12.5 MG tablet TAKE 1 TABLET(12.5 MG) BY MOUTH THREE TIMES DAILY AS NEEDED FOR DIZZINESS Patient taking differently: Take 12.5 mg by mouth 3 (three) times daily as needed for dizziness or nausea.  03/01/20   [name] Helper, MD  metolazone (ZAROXOLYN) 2.5 MG tablet Take one tablet on Saturdays. Patient taking differently: Take 2.5 mg by mouth every Saturday.  11/24/19   Croitoru, Mihai, MD  metoprolol succinate (TOPROL-XL) 100 MG 24 hr tablet TAKE 1 TABLET DAILY WITH ORIMMEDIATELY FOLLOWING A    MEAL 04/19/20   [name] Helper, MD  nitroGLYCERIN (NITROSTAT) 0.4 MG SL tablet Place 1 tablet (0.4 mg total) under the tongue every 5 (five) minutes as needed for chest pain. 08/03/19   Rolland Porter, MD  ondansetron (ZOFRAN ODT) 4 MG disintegrating tablet Take 1 tablet (4 mg total) by mouth every 8 (eight) hours as needed for nausea or vomiting. 01/26/20   Fawze, Mina A, PA-C  Polyethyl [name] (SYSTANE) 0.4-0.3 % GEL ophthalmic gel Place 1 application into the left eye at bedtime.    [provider]  polyethylene glycol powder (GLYCOLAX/MIRALAX) 17 GM/SCOOP powder Take 17 g by mouth daily as needed for mild constipation or moderate constipation.    [provider]  potassium chloride SA (KLOR-CON) 20 MEQ tablet Take 1 tablet (20 mEq total) by mouth daily. 04/02/20   Johnson, Clanford L, MD  pravastatin (PRAVACHOL) 40 MG tablet TAKE 1 TABLET  BY MOUTH EVERY EVENING Patient taking differently: Take 40 mg by mouth every evening.  11/10/19   [name] Helper, MD  SYNTHROID 75 MCG tablet Take 1 tablet (75 mcg total) by mouth daily before breakfast. 04/02/20   Irwin Brakeman L, MD  warfarin (JANTOVEN) 5 MG tablet Take 1-2 tablets (5-10 mg total) by mouth See admin instructions. TAKE 2 TABLETS EVERY DAY EXCEPT 1 TABLET ON TUESDAYS @ 8:30PM 04/02/20   Murlean Iba, MD    Family History    Family History  Problem Relation Age of Onset  . Pancreatic cancer Mother   . Heart disease Father   . Heart disease Sister   . Heart attack Sister   . Heart disease Brother   . Diabetes Brother   . Heart disease Brother   . Diabetes Brother   . Hypertension Brother   . Lung cancer Brother    She indicated that her mother is deceased. She indicated that her father is deceased. She indicated that her sister is deceased. She indicated that all of her three brothers are deceased.  Social History    Social History   Socioeconomic History  . Marital status: Widowed    Spouse name: Not on file  . Number of children: 0  . Years of education: Not on file  . Highest education level: Not on file  Occupational History  . Occupation: retired    Fish farm manager: RETIRED  Tobacco Use  . Smoking status: Former Smoker    Packs/day: 0.75    Years: 40.00    Pack years: 30.00    Types: Cigarettes    Quit date: 03/08/1987  Years since quitting: 33.1  . Smokeless tobacco: Never Used  Vaping Use  . Vaping Use: Never used  Substance and Sexual Activity  . Alcohol use: No    Alcohol/week: 0.0 standard drinks  . Drug use: No  . Sexual activity: Not Currently  Other Topics Concern  . Not on file  Social History Narrative   From [place] (2004)   Social Determinants of Health   Financial Resource Strain:   . Difficulty of Paying Living Expenses:   Food Insecurity:   . Worried About Charity fundraiser in the Last Year:   . Arboriculturist  in the Last Year:   Transportation Needs:   . Film/video editor (Medical):   Marland Kitchen Lack of Transportation (Non-Medical):   Physical Activity:   . Days of Exercise per Week:   . Minutes of Exercise per Session:   Stress:   . Feeling of Stress :   Social Connections:   . Frequency of Communication with Friends and Family:   . Frequency of Social Gatherings with Friends and Family:   . Attends Religious Services:   . Active Member of Clubs or Organizations:   . Attends Archivist Meetings:   Marland Kitchen Marital Status:   Intimate Partner Violence:   . Fear of Current or Ex-Partner:   . Emotionally Abused:   Marland Kitchen Physically Abused:   . Sexually Abused:      Review of Systems    General:  No chills, fever, night sweats or weight changes.  Cardiovascular:  No chest pain, dyspnea on exertion, edema, orthopnea, palpitations, paroxysmal nocturnal dyspnea. Dermatological: No rash, lesions/masses Respiratory: No cough, dyspnea Urologic: No hematuria, dysuria Abdominal:   No nausea, vomiting, diarrhea, bright red blood per rectum, melena, or hematemesis Neurologic:  No visual changes, wkns, changes in mental status. All other systems reviewed and are otherwise negative except as noted above.  Physical Exam    VS:  BP (!) 136/79   Pulse 60   Ht 5\' 7"  (1.702 m)   Wt 195 lb 9.6 oz (88.7 kg)   SpO2 97%   BMI 30.64 kg/m  , BMI Body mass index is 30.64 kg/m. GEN: Well nourished, well developed, in no acute distress. HEENT: normal. Neck: Supple, no JVD, carotid bruits, or masses. Cardiac: RRR, no murmurs, rubs, or gallops. No clubbing, cyanosis, edema.  Radials/DP/PT 2+ and equal bilaterally.  Respiratory:  Respirations regular and unlabored, clear to auscultation bilaterally. GI: Soft, nontender, nondistended, BS + x 4. MS: no deformity or atrophy. Skin: warm and dry, no rash. Neuro:  Strength and sensation are intact. Psych: Normal affect.  Accessory Clinical Findings    Recent  Labs: 02/16/2020: TSH 2.32 03/30/2020: B Natriuretic Peptide 502.0 04/02/2020: Magnesium 1.8 04/14/2020: ALT 28; BUN 57; Creatinine, Ser 1.51; Hemoglobin 9.8; Platelets 202; Potassium 4.6; Sodium 135   Recent Lipid Panel    Component Value Date/Time   CHOL 124 01/05/2020 1253   CHOL 165 10/16/2016 1213   TRIG 73 01/05/2020 1253   HDL 44 (L) 01/05/2020 1253   HDL 31 (L) 10/16/2016 1213   CHOLHDL 2.8 01/05/2020 1253   VLDL 12 07/07/2019 1354   LDLCALC 65 01/05/2020 1253    ECG personally reviewed by me today-none today. Echocardiogram 01/31/2020 1. Left ventricular ejection fraction, by estimation, is 30 to 35%. The  left ventricle has moderately decreased function. The left ventricle  demonstrates global hypokinesis. There is mild concentric left ventricular  hypertrophy. Left ventricular  diastolic  function could not be evaluated.   2. Right ventricular systolic function is normal. The right ventricular  size is normal. There is severely elevated pulmonary artery systolic  pressure. The estimated right ventricular systolic pressure is 16.1 mmHg.   3. Left atrial size was severely dilated.   4. Right atrial size was moderately dilated.   5. The mitral valve has been repaired/replaced. No evidence of mitral  valve regurgitation.   6. The aortic valve has been repaired/replaced. Aortic valve  regurgitation is not visualized.  Assessment & Plan   Acute on Chronic Combined Systolic and Diastolic CHF-weight today 195.6 pounds today, 190 04/01/20.  +1 pitting bilateral lower extremity edema.  Echocardiogram 01/31/2020 showed an LVEF of 30-35%, left ventricular diastolic function could not be evaluated, RV normal, left atrium severely dilated, right atrium moderately dilated, well-functioning mitral valve replacement, well-functioning aortic valve replacement.  Her device was previously reprogrammed as it was not felt to be capturing appropriately and this could have contributed to her recurrent  cardiomyopathy.  Discussed the importance of taking metolazone 30 minutes prior to diuretic therapy.  Patient and daughter expressed understanding. Continue furosemide, metolazone, metoprolol Increase furosemide to 80 mg twice daily for 3 days then resume 40 mg dosing Increase potassium to 40 mEq daily then resume 20 mill equivalent dosing Heart healthy low-sodium diet-salty 6 given Increase physical activity as tolerated Daily weights-contact office with a weight increase of 3 pounds overnight or 5 pounds in 1 week. Fluid restriction of 64 ounces - 80 ounces daily Lower extremity support stockings Elevate legs when not active BMP in 1 week   CHB -received Medtronic BiV PPM which was placed on 01/2019.  Followed by Dr. Lovena Le.   Persistent Atrial Fibrillation/Flutter-heart rate today 60.  Denies bleeding issues.  Noted to have chronic anemia (hemoglobin 9.8 and stable on 04/14/2020). Continue metoprolol, Coumadin Avoid triggers caffeine, chocolate, EtOH etc. Heart healthy low-sodium diet-salty 6 given Increase physical activity as tolerated     Mechanical AVR in 2001 and Mechanical MVR in 2004-Echo 01/2020 showed normal valve function.  INR 2.1 on 04/20/2020. Continue Coumadin    Stage 3 CKD-1.51 on 04/14/2020 and stable at 1.4 over the last several days.  K+ 4.6, and NA 135. Monitored by PCP  Disposition: Follow-up with Dr. Sallyanne Kuster or me in 1 week.   Jossie Ng. Eliu Batch NP-C    04/23/2020, 11:56 AM [address] Office [phone] Fax [phone]  Notice: This dictation was prepared with Dragon dictation along with smaller phrase technology. Any transcriptional errors that result from this process are unintentional and may not be corrected upon review.

## 2020-04-25 ENCOUNTER — Encounter: Payer: Self-pay | Admitting: Family Medicine

## 2020-04-25 NOTE — Assessment & Plan Note (Signed)
Currently , suboptimally managed as pt is self adjusting and doubling lasix dose , and not specifically limiting fluid intake, re educated in office , has upcoming Cardiology appt and I have recommended she clarify diuretic dosing and the ability for her to change this a d lib Daily weights and low salt intake also discussed

## 2020-04-25 NOTE — Progress Notes (Addendum)
[name] [name]     MRN: [id number]      DOB: 1932/08/12   HPI [name] [name] is here for follow up of recent hospitalization from July 6 to July 9.  Patient was admitted with acute on chronic combined systolic heart failure.  Both she and her need have no understanding that this was a reason for her admission.  [name] [name] states that she has been weighing daily and she has gained 2 pounds since her discharge.  She denies significant leg swelling but does note mild leg swelling from time to time particularly as the day goes on.  She is not necessarily restricting her fluids nor does she elevate her legs all the time.  She has voluntarily increased dose of Lasix by doubling this as she states she feels better on the higher dose.  Advised her to consult cardiology prior to changing medication doses as she will need closer follow-up in terms of  lab work and clinically.  [name] [name] requests a manual wheelchair for use in her home.  She has markedly impaired  mobility to the extent that she is incapable of adequately toileting feeding dressing grooming and bathing herself in  the customary locations in her home because of limited mobility.  [name] [name] does have sufficient upper extremity strength and function as well her mental capabilities needed to safely propel a manual wheelchair in her home.  She also has a caregiver 5 days/week who will assist her.  [name] [name] mobility limitation cannot be sufficiently and safely by the use of an appropriately fitted cane or walker because of the severe arthritis in her spine hips and knees.  Providing her with a manual wheelchair will improve the quality of her life, enables her to independently, and to reduce fall risk.     ROS Denies recent fever or chills. Denies sinus pressure, nasal congestion, ear pain or sore throat. Denies chest congestion, productive cough or wheezing. Denies chest pains, palpitations , pND or orthopnea, does have  leg  swelling Denies abdominal pain, nausea, vomiting,diarrhea or constipation.   Denies dysuria, frequency, c/o incontinence. C/o chronic  joint pain, swelling and limitation in mobility. Denies headaches, seizures, Denies skin break down or rash.   PE  BP (!) 145/75   Resp 16   Ht 5\' 7"  (1.702 m)   Wt 197 lb (89.4 kg)   SpO2 92% Comment: 2 liters oxygen  BMI 30.85 kg/m   Patient alert and oriented and in no cardiopulmonary distress.  HEENT: No facial asymmetry, EOMI,     Neck decreased ROM .  Chest: Clear to auscultation bilaterally.  CVS: S1, S2 systolic  murmur, no S3.Regular rate.  ABD: Soft non tender.   Ext:one pluso edema  MS: decreased rOM  Spine , hips an knees, adequate in shoulders Skin: Intact, no ulcerations or rash noted.  Psych: Good eye contact, normal affect. Memory intact not anxious or depressed appearing.  CNS: CN 2-12 intact, grade 5 power in upper extremities, grade 4 power with reduced tone in lower extremities  .   Assessment & Plan  Encounter for examination following treatment at hospital Patient in for follow up of recent hospitalization. Discharge summary, and laboratory and radiology data are reviewed, and any questions or concerns  are discussed. Specific issues requiring follow up are specifically addressed.   CKD (chronic kidney disease), stage IV ([place]) Stable and followed by Nephrology  Hospital discharge follow-up Patient in for follow up of recent hospitalization. Discharge summary, and  laboratory and radiology data are reviewed, and any questions or concerns  are discussed. Specific issues requiring follow up are specifically addressed.   Chronic systolic congestive heart failure (HCC) Currently , suboptimally managed as pt is self adjusting and doubling lasix dose , and not specifically limiting fluid intake, re educated in office , has upcoming Cardiology appt and I have recommended she clarify diuretic dosing and the ability  for her to change this a d lib Daily weights and low salt intake also discussed  Physical debility Physical debility and generalized sepvere osteoarthritis of spine, hips and knees , markedly limiting safe ambulation in her home , and also limiting ADL's like bathing, toilet ing, dressing, feeding herself, use of a manual wheelchair in her home will markedly improve her quality of life and increase her safety  Generalized osteoarthritis Severely affects spine , hips and knees, limits safe mobility in the home to perform ADL's in the customary locations in the home, and walker or cane will not be sufficient to enable this. Needs wheelchair, an has sufficient upper extremity strngth and good ROM shoulders as well as the mental capacity to  Safely use a wheelchair in her home

## 2020-04-25 NOTE — Assessment & Plan Note (Signed)
Patient in for follow up of recent hospitalization. Discharge summary, and laboratory and radiology data are reviewed, and any questions or concerns  are discussed. Specific issues requiring follow up are specifically addressed.  

## 2020-04-25 NOTE — Assessment & Plan Note (Signed)
Stable and followed by Nephrology 

## 2020-04-26 ENCOUNTER — Ambulatory Visit (INDEPENDENT_AMBULATORY_CARE_PROVIDER_SITE_OTHER): Payer: Medicare Other | Admitting: *Deleted

## 2020-04-26 ENCOUNTER — Telehealth: Payer: Self-pay

## 2020-04-26 ENCOUNTER — Telehealth: Payer: Self-pay | Admitting: Cardiovascular Disease

## 2020-04-26 DIAGNOSIS — Z952 Presence of prosthetic heart valve: Secondary | ICD-10-CM | POA: Diagnosis not present

## 2020-04-26 DIAGNOSIS — Z5181 Encounter for therapeutic drug level monitoring: Secondary | ICD-10-CM

## 2020-04-26 DIAGNOSIS — N184 Chronic kidney disease, stage 4 (severe): Secondary | ICD-10-CM | POA: Diagnosis not present

## 2020-04-26 DIAGNOSIS — I5043 Acute on chronic combined systolic (congestive) and diastolic (congestive) heart failure: Secondary | ICD-10-CM | POA: Diagnosis not present

## 2020-04-26 DIAGNOSIS — I0981 Rheumatic heart failure: Secondary | ICD-10-CM | POA: Diagnosis not present

## 2020-04-26 DIAGNOSIS — I130 Hypertensive heart and chronic kidney disease with heart failure and stage 1 through stage 4 chronic kidney disease, or unspecified chronic kidney disease: Secondary | ICD-10-CM | POA: Diagnosis not present

## 2020-04-26 DIAGNOSIS — D631 Anemia in chronic kidney disease: Secondary | ICD-10-CM | POA: Diagnosis not present

## 2020-04-26 DIAGNOSIS — E1122 Type 2 diabetes mellitus with diabetic chronic kidney disease: Secondary | ICD-10-CM | POA: Diagnosis not present

## 2020-04-26 LAB — POCT INR: INR: 1.8 — AB (ref 2.0–3.0)

## 2020-04-26 NOTE — Telephone Encounter (Signed)
Follow up  Pt is calling back to speak with [name] [name]   Please call

## 2020-04-26 NOTE — Telephone Encounter (Signed)
Spoke with the patient who states that she would like to cancel her appointment with Coletta Memos on 08/23. She states that she already has an appointment with Dr. Sallyanne Kuster on 08/31 and she would like to keep that one instead.

## 2020-04-26 NOTE — Telephone Encounter (Signed)
PTINR 1.8 , 22.0 [name] [name] called to give results to [facility]. [phone]

## 2020-04-26 NOTE — Telephone Encounter (Signed)
Patient is calling in regards to appointment currently scheduled for 05/14/20 with [name] [name]. She states she will have to reschedule due to a lack of transportation. Her niece, [name] [name], will will not be able to bring her in because she has an appointment on the same day. I made her aware that Dr. Christiana Pellant do not have any availability for appointments in office until 06/02/20. Patient states she would like a call to be returned to her niece, [name] [name], to explain importance of appointment and that patient may not be seen until the following month. pleae return call to [facility] at [phone].

## 2020-04-26 NOTE — Patient Instructions (Signed)
Increase warfarin to 2 tablets daily  Continue greens Recheck in 1 week Order given to [employer]

## 2020-04-26 NOTE — Telephone Encounter (Signed)
Done.  See coumadin note. 

## 2020-04-26 NOTE — Telephone Encounter (Signed)
Called patient and she gave permission to call her niece. I called the niece and she states that she has an appointment that can not be moved, and she would like to move her aunts appointment. I was able to move it to the following Monday- the niece will call patient and notify her that we were able to move it.  They were thankful for call back.

## 2020-04-27 ENCOUNTER — Other Ambulatory Visit: Payer: Self-pay | Admitting: *Deleted

## 2020-04-27 NOTE — Patient Outreach (Signed)
[facility]) Care Management  04/27/2020  [name] [name] October 29, 1931 [id number]   Subjective: Telephone call to patient's home number, spoke with patient, and HIPAA verified.  Discussed [facility] Liaison referral follow up, patient voiced understanding, and is in agreement to a brief follow up.  Patient states she is doing pretty good, has had a lot of follow up appointments, has a lot on her right now, and requested call back in approximately 3 weeks to discuss program enrollment.  States she is very appreciative of the follow up, is in agreement to receive [place] Management information, and  is in agreement  to receive 1 additional follow up call to assess for further CM needs.   States she will reach out to this RNCM if needed prior to next outreach.     Objective: Per KPN (Knowledge Performance Now, point of care tool) and chart review, patient hospitalized 03/30/2020 - 04/02/2020 for Acute on chronic congestive heart failure, Chronic systolic congestive heart failure, Acute systolic CHF (congestive heart failure), Bilateral lower extremity edema .    Patient also has a history of hypertension, aortic valve replacement, mechanical, status post MVR (mitral valve replacement), Biventricular automatic implantable cardioverter defibrillator in situ, CKD (chronic kidney disease), stage IV, Chronic anticoagulation, on coumadin for Mechanical AVR and MVR,  COPD (chronic obstructive pulmonary disease), Persistent atrial fibrillation, Chronic hypoxic respiratory failure, chronic supplemental oxygen 2L/m, diabetes, Anemia of chronic disease , and Hypothyroidism.      Assessment: Received [facility] Liaison screening follow up referral on 04/05/2020.  Referral source: Joylene Draft.   Referral reason: Please assign to telephonic RN for post hospital follow up. Patient discharged home on 04/02/20 with Home Health services, chronic oxygen Transition  Of [facility] by Primary Care Provider at [facility].   Screening  follow up pending patient contact.      Plan: RNCM will send unsuccessful outreach  letter, [facility] pamphlet, will call patient for 2nd telephone outreach attempt within 21 business days per patient's request, screening follow up, and will proceed with case closure, after 4th unsuccessful outreach call.      Kampbell Holaway H. Annia Friendly, BSN, [place] Management [facility] Telephonic CM Phone: [phone] Fax: [phone]

## 2020-04-30 DIAGNOSIS — E1122 Type 2 diabetes mellitus with diabetic chronic kidney disease: Secondary | ICD-10-CM | POA: Diagnosis not present

## 2020-04-30 DIAGNOSIS — I130 Hypertensive heart and chronic kidney disease with heart failure and stage 1 through stage 4 chronic kidney disease, or unspecified chronic kidney disease: Secondary | ICD-10-CM | POA: Diagnosis not present

## 2020-04-30 DIAGNOSIS — N184 Chronic kidney disease, stage 4 (severe): Secondary | ICD-10-CM | POA: Diagnosis not present

## 2020-04-30 DIAGNOSIS — I5043 Acute on chronic combined systolic (congestive) and diastolic (congestive) heart failure: Secondary | ICD-10-CM | POA: Diagnosis not present

## 2020-04-30 DIAGNOSIS — D631 Anemia in chronic kidney disease: Secondary | ICD-10-CM | POA: Diagnosis not present

## 2020-04-30 DIAGNOSIS — I0981 Rheumatic heart failure: Secondary | ICD-10-CM | POA: Diagnosis not present

## 2020-05-03 ENCOUNTER — Ambulatory Visit (INDEPENDENT_AMBULATORY_CARE_PROVIDER_SITE_OTHER): Payer: Medicare Other | Admitting: *Deleted

## 2020-05-03 DIAGNOSIS — D631 Anemia in chronic kidney disease: Secondary | ICD-10-CM | POA: Diagnosis not present

## 2020-05-03 DIAGNOSIS — N184 Chronic kidney disease, stage 4 (severe): Secondary | ICD-10-CM | POA: Diagnosis not present

## 2020-05-03 DIAGNOSIS — I0981 Rheumatic heart failure: Secondary | ICD-10-CM | POA: Diagnosis not present

## 2020-05-03 DIAGNOSIS — Z952 Presence of prosthetic heart valve: Secondary | ICD-10-CM | POA: Diagnosis not present

## 2020-05-03 DIAGNOSIS — I130 Hypertensive heart and chronic kidney disease with heart failure and stage 1 through stage 4 chronic kidney disease, or unspecified chronic kidney disease: Secondary | ICD-10-CM | POA: Diagnosis not present

## 2020-05-03 DIAGNOSIS — I5043 Acute on chronic combined systolic (congestive) and diastolic (congestive) heart failure: Secondary | ICD-10-CM | POA: Diagnosis not present

## 2020-05-03 DIAGNOSIS — Z5181 Encounter for therapeutic drug level monitoring: Secondary | ICD-10-CM | POA: Diagnosis not present

## 2020-05-03 DIAGNOSIS — E1122 Type 2 diabetes mellitus with diabetic chronic kidney disease: Secondary | ICD-10-CM | POA: Diagnosis not present

## 2020-05-03 LAB — POCT INR: INR: 1.6 — AB (ref 2.0–3.0)

## 2020-05-03 NOTE — Patient Instructions (Signed)
Take warfarin 3 tablets tonight, 2 1/2 tablets tomorrow night then resume 2 tablets daily  Continue greens Recheck in 2 weeks in office.  D/C from [facility] today Order given to [facility]

## 2020-05-04 ENCOUNTER — Other Ambulatory Visit: Payer: Self-pay | Admitting: Family Medicine

## 2020-05-04 IMAGING — MG MM DIGITAL SCREENING BILAT W/ TOMO W/ CAD
6 of 12 series · 6 of 36 positions shown · non-contrast
Comparison: Previous exam(s).

CLINICAL DATA: Screening.

EXAM:
DIGITAL SCREENING BILATERAL MAMMOGRAM WITH TOMO AND CAD

[R MLO synth-2D]
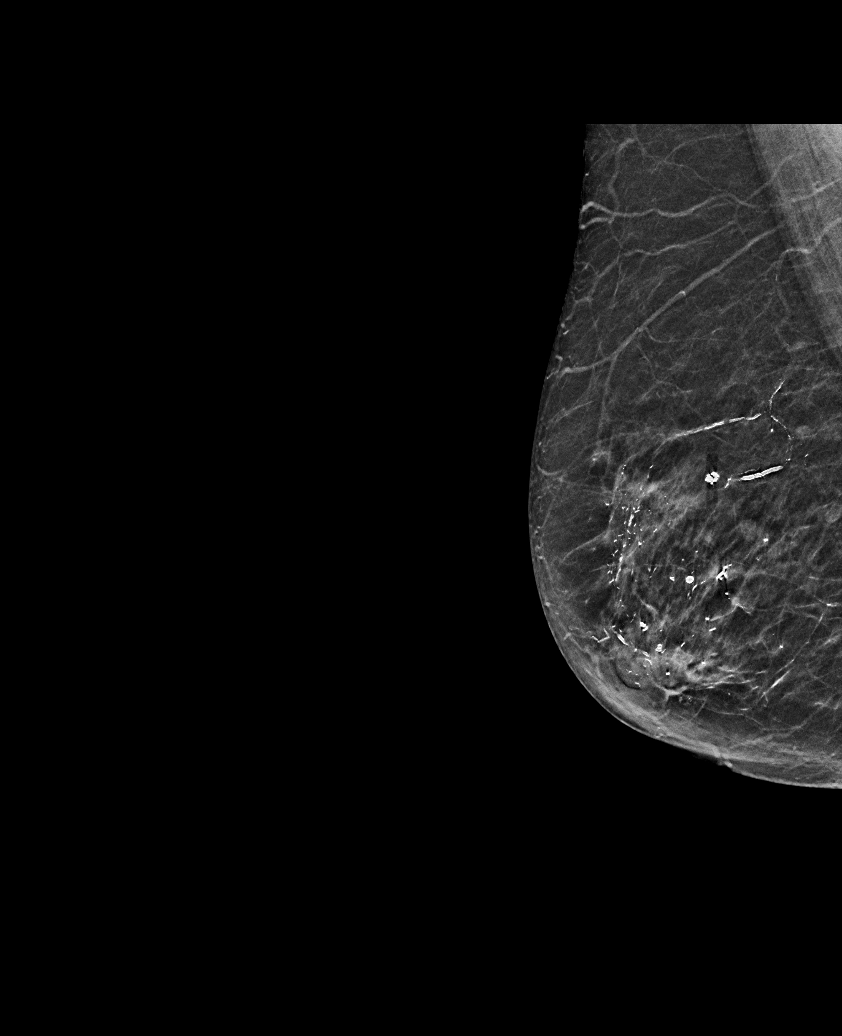

[R CC synth-2D (1 of 2)]
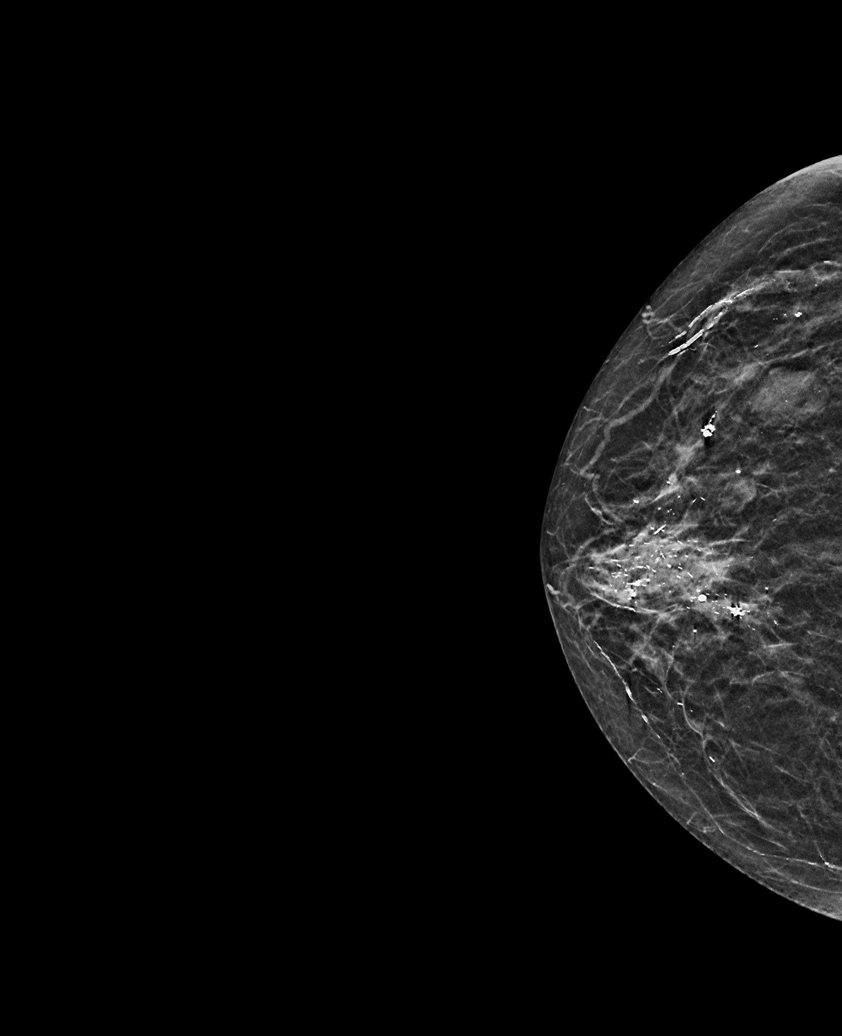

[L MLO synth-2D (1 of 2)]
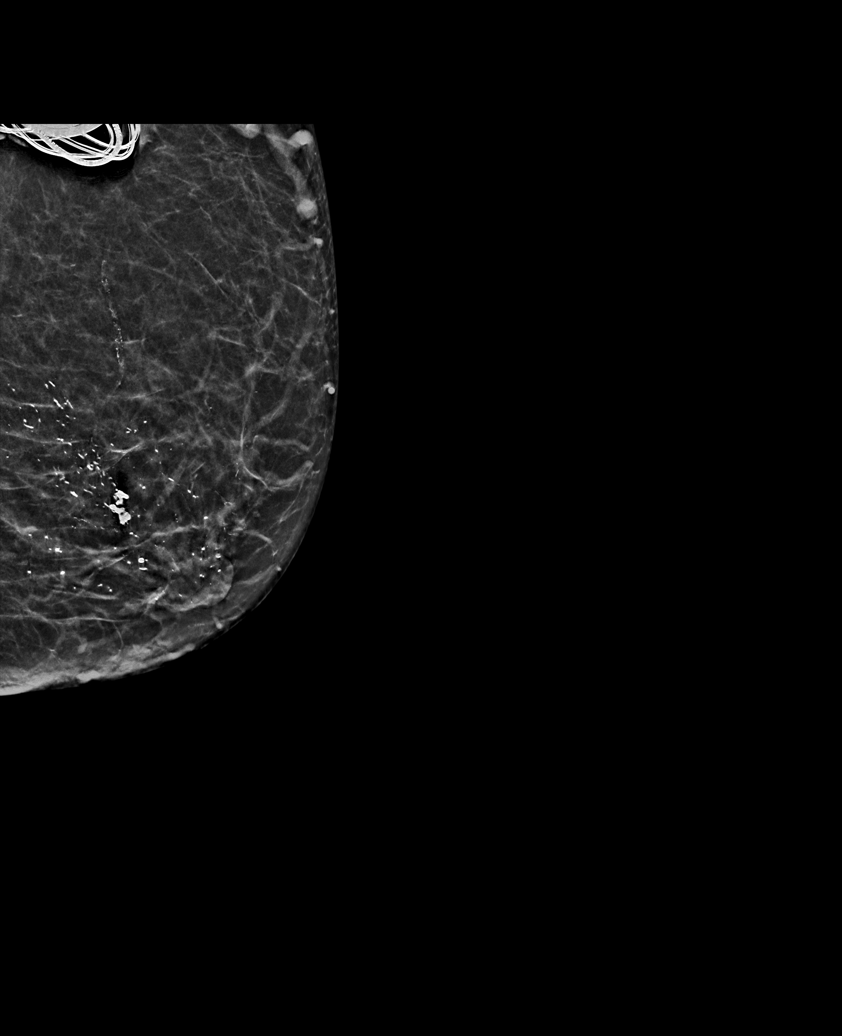

[L CC synth-2D]
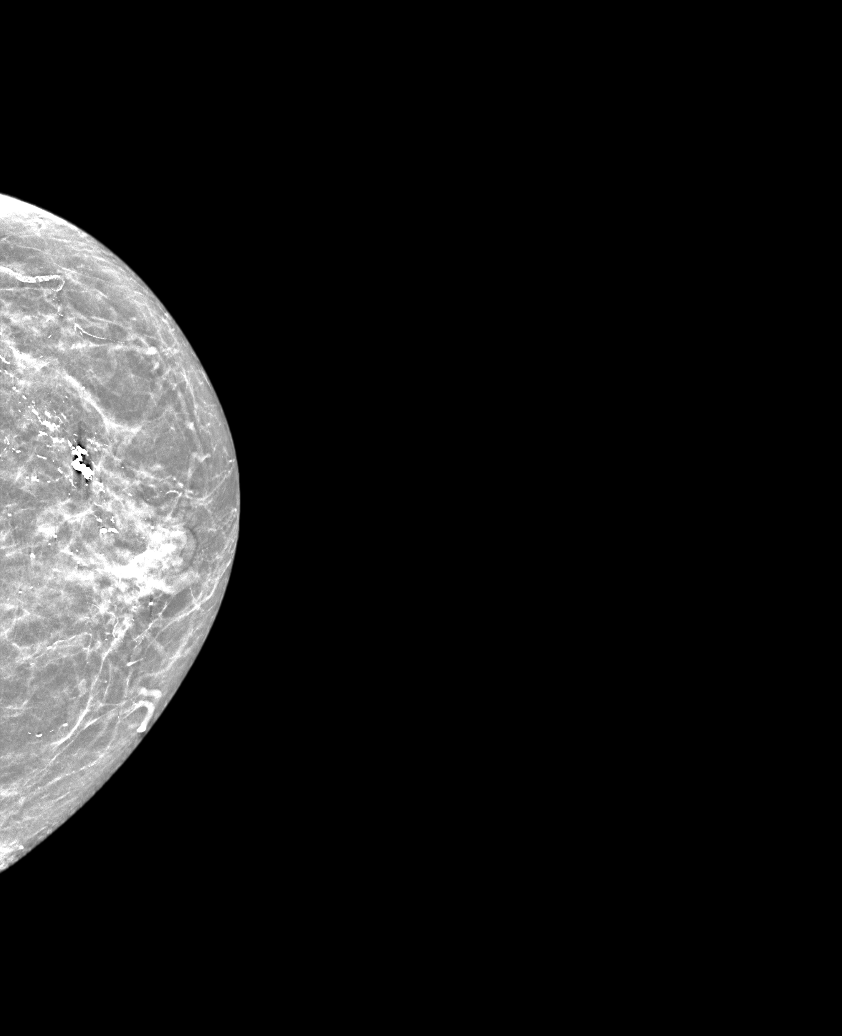

[R CC synth-2D (2 of 2)]
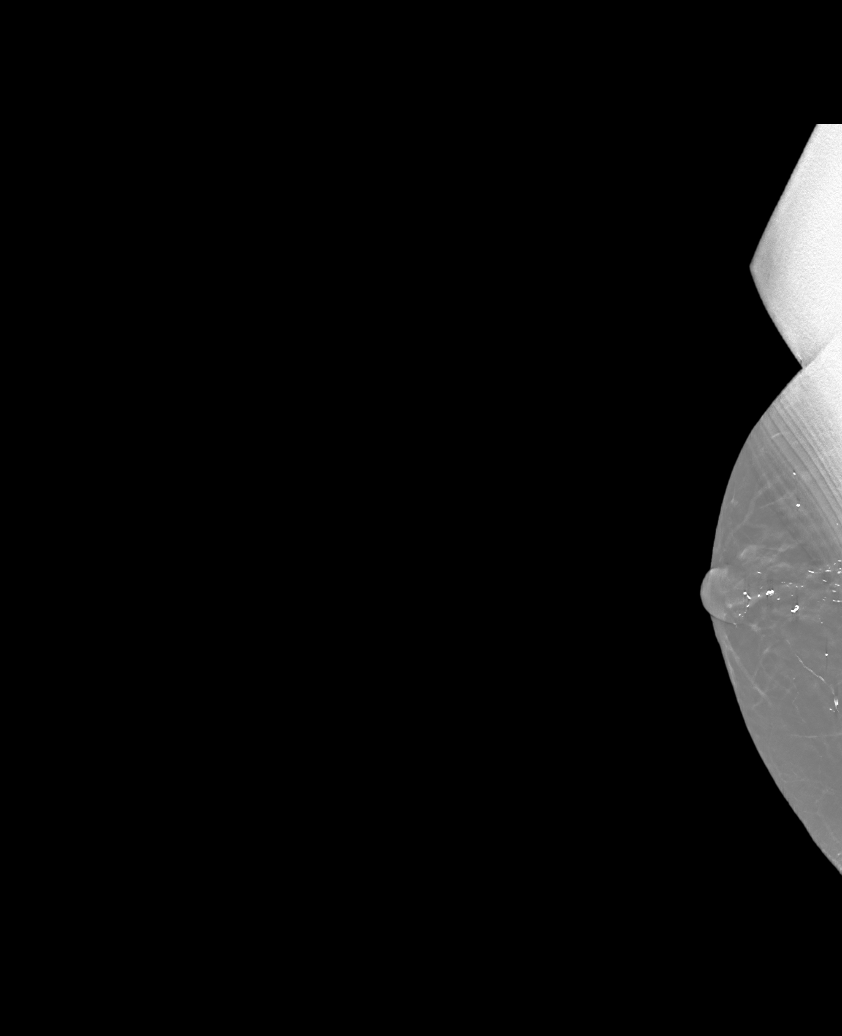

[L MLO synth-2D (2 of 2)]
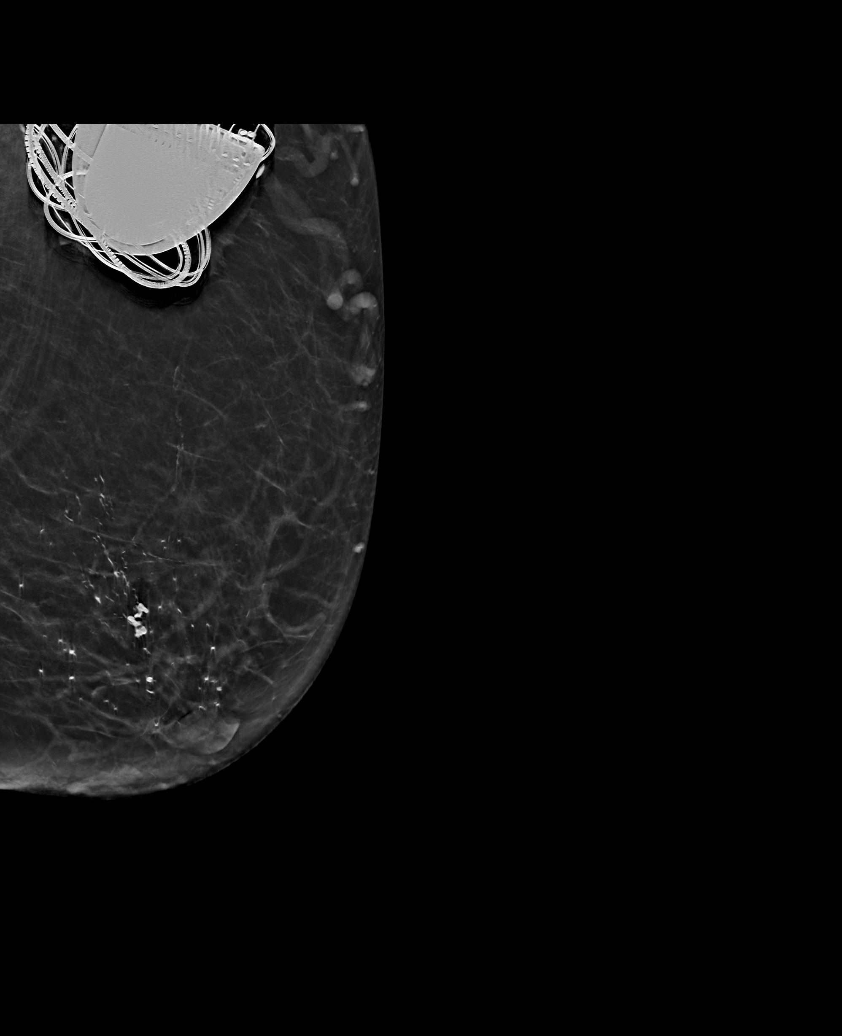

[6 of 36 positions shown; findings below may reference images not displayed]

ACR Breast Density Category c: The breast tissue is heterogeneously
dense, which may obscure small masses.
FINDINGS: In the right breast, a possible asymmetry warrants further
evaluation. In the left breast, no findings suspicious for
malignancy. Images were processed with CAD.
IMPRESSION: Further evaluation is suggested for possible asymmetry in the right
breast.

RECOMMENDATION:
Diagnostic mammogram and possibly ultrasound of the right breast.
(Code:[id number])

The patient will be contacted regarding the findings, and additional
imaging will be scheduled.

BI-RADS CATEGORY  0: Incomplete. Need additional imaging evaluation
and/or prior mammograms for comparison.

## 2020-05-09 DIAGNOSIS — M159 Polyosteoarthritis, unspecified: Secondary | ICD-10-CM | POA: Insufficient documentation

## 2020-05-09 NOTE — Addendum Note (Signed)
Addended by: Fayrene Helper on: 05/09/2020 04:24 PM   Modules accepted: Level of Service

## 2020-05-09 NOTE — Assessment & Plan Note (Signed)
Physical debility and generalized sepvere osteoarthritis of spine, hips and knees , markedly limiting safe ambulation in her home , and also limiting ADL's like bathing, toilet ing, dressing, feeding herself, use of a manual wheelchair in her home will markedly improve her quality of life and increase her safety

## 2020-05-09 NOTE — Assessment & Plan Note (Addendum)
Severely affects spine , hips and knees, limits safe mobility in the home to perform ADL's in the customary locations in the home, and walker or cane will not be sufficient to enable this. Needs wheelchair, an has sufficient upper extremity strngth and good ROM shoulders as well as the mental capacity to  Safely use a wheelchair in her home

## 2020-05-10 ENCOUNTER — Other Ambulatory Visit: Payer: Self-pay

## 2020-05-10 ENCOUNTER — Telehealth (INDEPENDENT_AMBULATORY_CARE_PROVIDER_SITE_OTHER): Payer: Medicare Other

## 2020-05-10 VITALS — BP 136/79 | Ht 67.0 in | Wt 197.0 lb

## 2020-05-10 DIAGNOSIS — Z0000 Encounter for general adult medical examination without abnormal findings: Secondary | ICD-10-CM

## 2020-05-10 NOTE — Patient Instructions (Signed)
[name] [name] , Thank you for taking time to come for your Medicare Wellness Visit. I appreciate your ongoing commitment to your health goals. Please review the following plan we discussed and let me know if I can assist you in the future.   Screening recommendations/referrals: Colonoscopy: no longer needed Mammogram: no longer needed Bone Density: no longer needed Recommended yearly ophthalmology/optometry visit for glaucoma screening and checkup Recommended yearly dental visit for hygiene and checkup  Vaccinations: Influenza vaccine: Due in fall 2021 Pneumococcal vaccine: up to date Tdap vaccine: up to date Shingles vaccine: declined     Conditions/risks identified: fall risk  Next appointment: wellness in 12 months   Preventive Care 35 Years and Older, Female Preventive care refers to lifestyle choices and visits with your health care provider that can promote health and wellness. What does preventive care include?  A yearly physical exam. This is also called an annual well check.  Dental exams once or twice a year.  Routine eye exams. Ask your health care provider how often you should have your eyes checked.  Personal lifestyle choices, including:  Daily care of your teeth and gums.  Regular physical activity.  Eating a healthy diet.  Avoiding tobacco and drug use.  Limiting alcohol use.  Practicing safe sex.  Taking low-dose aspirin every day.  Taking vitamin and mineral supplements as recommended by your health care provider. What happens during an annual well check? The services and screenings done by your health care provider during your annual well check will depend on your age, overall health, lifestyle risk factors, and family history of disease. Counseling  Your health care provider may ask you questions about your:  Alcohol use.  Tobacco use.  Drug use.  Emotional well-being.  Home and relationship well-being.  Sexual activity.  Eating  habits.  History of falls.  Memory and ability to understand (cognition).  Work and work Statistician.  Reproductive health. Screening  You may have the following tests or measurements:  Height, weight, and BMI.  Blood pressure.  Lipid and cholesterol levels. These may be checked every 5 years, or more frequently if you are over 17 years old.  Skin check.  Lung cancer screening. You may have this screening every year starting at age 4 if you have a 30-pack-year history of smoking and currently smoke or have quit within the past 15 years.  Fecal occult blood test (FOBT) of the stool. You may have this test every year starting at age 60.  Flexible sigmoidoscopy or colonoscopy. You may have a sigmoidoscopy every 5 years or a colonoscopy every 10 years starting at age 71.  Hepatitis C blood test.  Hepatitis B blood test.  Sexually transmitted disease (STD) testing.  Diabetes screening. This is done by checking your blood sugar (glucose) after you have not eaten for a while (fasting). You may have this done every 1-3 years.  Bone density scan. This is done to screen for osteoporosis. You may have this done starting at age 17.  Mammogram. This may be done every 1-2 years. Talk to your health care provider about how often you should have regular mammograms. Talk with your health care provider about your test results, treatment options, and if necessary, the need for more tests. Vaccines  Your health care provider may recommend certain vaccines, such as:  Influenza vaccine. This is recommended every year.  Tetanus, diphtheria, and acellular pertussis (Tdap, Td) vaccine. You may need a Td booster every 10 years.  Zoster vaccine.  You may need this after age 47.  Pneumococcal 13-valent conjugate (PCV13) vaccine. One dose is recommended after age 62.  Pneumococcal polysaccharide (PPSV23) vaccine. One dose is recommended after age 2. Talk to your health care provider about which  screenings and vaccines you need and how often you need them. This information is not intended to replace advice given to you by your health care provider. Make sure you discuss any questions you have with your health care provider. Document Released: 10/08/2015 Document Revised: 05/31/2016 Document Reviewed: 07/13/2015 Elsevier Interactive Patient Education  2017 [place] Prevention in the Home Falls can cause injuries. They can happen to people of all ages. There are many things you can do to make your home safe and to help prevent falls. What can I do on the outside of my home?  Regularly fix the edges of walkways and driveways and fix any cracks.  Remove anything that might make you trip as you walk through a door, such as a raised step or threshold.  Trim any bushes or trees on the path to your home.  Use bright outdoor lighting.  Clear any walking paths of anything that might make someone trip, such as rocks or tools.  Regularly check to see if handrails are loose or broken. Make sure that both sides of any steps have handrails.  Any raised decks and porches should have guardrails on the edges.  Have any leaves, snow, or ice cleared regularly.  Use sand or salt on walking paths during winter.  Clean up any spills in your garage right away. This includes oil or grease spills. What can I do in the bathroom?  Use night lights.  Install grab bars by the toilet and in the tub and shower. Do not use towel bars as grab bars.  Use non-skid mats or decals in the tub or shower.  If you need to sit down in the shower, use a plastic, non-slip stool.  Keep the floor dry. Clean up any water that spills on the floor as soon as it happens.  Remove soap buildup in the tub or shower regularly.  Attach bath mats securely with double-sided non-slip rug tape.  Do not have throw rugs and other things on the floor that can make you trip. What can I do in the bedroom?  Use  night lights.  Make sure that you have a light by your bed that is easy to reach.  Do not use any sheets or blankets that are too big for your bed. They should not hang down onto the floor.  Have a firm chair that has side arms. You can use this for support while you get dressed.  Do not have throw rugs and other things on the floor that can make you trip. What can I do in the kitchen?  Clean up any spills right away.  Avoid walking on wet floors.  Keep items that you use a lot in easy-to-reach places.  If you need to reach something above you, use a strong step stool that has a grab bar.  Keep electrical cords out of the way.  Do not use floor polish or wax that makes floors slippery. If you must use wax, use non-skid floor wax.  Do not have throw rugs and other things on the floor that can make you trip. What can I do with my stairs?  Do not leave any items on the stairs.  Make sure that there are handrails on  both sides of the stairs and use them. Fix handrails that are broken or loose. Make sure that handrails are as long as the stairways.  Check any carpeting to make sure that it is firmly attached to the stairs. Fix any carpet that is loose or worn.  Avoid having throw rugs at the top or bottom of the stairs. If you do have throw rugs, attach them to the floor with carpet tape.  Make sure that you have a light switch at the top of the stairs and the bottom of the stairs. If you do not have them, ask someone to add them for you. What else can I do to help prevent falls?  Wear shoes that:  Do not have high heels.  Have rubber bottoms.  Are comfortable and fit you well.  Are closed at the toe. Do not wear sandals.  If you use a stepladder:  Make sure that it is fully opened. Do not climb a closed stepladder.  Make sure that both sides of the stepladder are locked into place.  Ask someone to hold it for you, if possible.  Clearly mark and make sure that you  can see:  Any grab bars or handrails.  First and last steps.  Where the edge of each step is.  Use tools that help you move around (mobility aids) if they are needed. These include:  Canes.  Walkers.  Scooters.  Crutches.  Turn on the lights when you go into a dark area. Replace any light bulbs as soon as they burn out.  Set up your furniture so you have a clear path. Avoid moving your furniture around.  If any of your floors are uneven, fix them.  If there are any pets around you, be aware of where they are.  Review your medicines with your doctor. Some medicines can make you feel dizzy. This can increase your chance of falling. Ask your doctor what other things that you can do to help prevent falls. This information is not intended to replace advice given to you by your health care provider. Make sure you discuss any questions you have with your health care provider. Document Released: 07/08/2009 Document Revised: 02/17/2016 Document Reviewed: 10/16/2014 Elsevier Interactive Patient Education  2017 [employer].

## 2020-05-10 NOTE — Progress Notes (Signed)
Subjective:   [name] [name] is a 86 y.o. female who presents for Medicare Annual (Subsequent) preventive examination.  Review of Systems     Cardiac Risk Factors include: advanced age (>82men, >66 women);dyslipidemia;hypertension;obesity (BMI >30kg/m2);sedentary lifestyle     Objective:    Today's Vitals   05/10/20 1402 05/10/20 1408  BP: 136/79   Weight: 197 lb (89.4 kg)   Height: 5\' 7"  (1.702 m)   PainSc:  9    Body mass index is 30.85 kg/m.  Advanced Directives 05/10/2020 04/16/2020 03/30/2020 03/30/2020 03/18/2020 02/18/2020 01/31/2020  Does Patient Have a Medical Advance Directive? No No No No No No No  Does patient want to make changes to medical advance directive? - No - Patient declined - - - - -  Would patient like information on creating a medical advance directive? Yes (ED - Information included in AVS) No - Patient declined No - Patient declined No - Patient declined No - Patient declined No - Patient declined No - Patient declined  Pre-existing out of facility DNR order (yellow form or pink MOST form) - - - Pink Most/Yellow Form available - Physician notified to receive inpatient order Pink MOST form placed in chart (order not valid for inpatient use) - -    Current Medications (verified) Outpatient Encounter Medications as of 05/10/2020  Medication Sig  . acetaminophen (TYLENOL) 500 MG tablet Take 500 mg by mouth every 6 (six) hours as needed (pain).   Marland Kitchen albuterol (PROVENTIL) (2.5 MG/3ML) 0.083% nebulizer solution Take 3 mLs (2.5 mg total) by nebulization in the morning and at bedtime. *May use additional one time if needed for shortness of breath  . albuterol (VENTOLIN HFA) 108 (90 Base) MCG/ACT inhaler Inhale 1-2 puffs into the lungs every 6 (six) hours as needed for wheezing or shortness of breath.  . allopurinol (ZYLOPRIM) 100 MG tablet Take 1 tablet (100 mg total) by mouth in the morning.  Marland Kitchen BREO ELLIPTA 100-25 MCG/INH AEPB 1 TAKE 1 INHALATION BY MOUTH ONCE DAILY  .  cholecalciferol (VITAMIN D3) 25 MCG (1000 UT) tablet Take 1,000 Units by mouth in the morning.   . docusate sodium (COLACE) 100 MG capsule Take 300 mg by mouth at bedtime.   . fluticasone (FLONASE) 50 MCG/ACT nasal spray SHAKE LIQUID AND USE 2 SPRAYS IN EACH NOSTRIL DAILY  . folic acid (FOLVITE) 1 MG tablet TAKE 1 TABLET(1 MG) BY MOUTH DAILY  . furosemide (LASIX) 40 MG tablet Take 40 mg by mouth 2 (two) times daily. Take 80mg  BID x3DAYS THEN BACK TO 40MG  BID  . gabapentin (NEURONTIN) 300 MG capsule Take 1 capsule (300 mg total) by mouth 2 (two) times daily.  . hydrALAZINE (APRESOLINE) 25 MG tablet Take 1 tablet (25 mg total) by mouth 2 (two) times daily.  . isosorbide mononitrate (IMDUR) 30 MG 24 hr tablet Take 0.5 tablets (15 mg total) by mouth every morning.  . Liniments (SALONPAS PAIN RELIEF PATCH EX) Apply 1 patch topically daily as needed (pain).  . meclizine (ANTIVERT) 12.5 MG tablet TAKE 1 TABLET(12.5 MG) BY MOUTH THREE TIMES DAILY AS NEEDED FOR DIZZINESS  . metolazone (ZAROXOLYN) 2.5 MG tablet Take one tablet on Saturdays.  . metoprolol succinate (TOPROL-XL) 100 MG 24 hr tablet TAKE 1 TABLET BY MOUTH EVERY DAY WITH FOOD OR MILK  . nitroGLYCERIN (NITROSTAT) 0.4 MG SL tablet Place 1 tablet (0.4 mg total) under the tongue every 5 (five) minutes as needed for chest pain.  Marland Kitchen ondansetron (ZOFRAN ODT) 4 MG disintegrating  tablet Take 1 tablet (4 mg total) by mouth every 8 (eight) hours as needed for nausea or vomiting.  [name] [name] (SYSTANE) 0.4-0.3 % GEL ophthalmic gel Place 1 application into the left eye at bedtime.  . polyethylene glycol powder (GLYCOLAX/MIRALAX) 17 GM/SCOOP powder Take 17 g by mouth daily as needed for mild constipation or moderate constipation.  . potassium chloride SA (KLOR-CON) 20 MEQ tablet Take 1 tablet (20 mEq total) by mouth daily. TAKE 40MEQ DAILY x3 DAYS THEN BACK TO 20MEQ DAILY  . pravastatin (PRAVACHOL) 40 MG tablet TAKE 1 TABLET BY MOUTH EVERY  EVENING  . SYNTHROID 75 MCG tablet Take 1 tablet (75 mcg total) by mouth daily before breakfast.  . warfarin (JANTOVEN) 5 MG tablet Take 1-2 tablets (5-10 mg total) by mouth See admin instructions. TAKE 2 TABLETS EVERY DAY EXCEPT 1 TABLET ON TUESDAYS @ 8:30PM   Facility-Administered Encounter Medications as of 05/10/2020  Medication  . epoetin alfa (EPOGEN,PROCRIT) injection 40,000 Units    Allergies (verified) Penicillins, Aspirin, Fentanyl, and Niacin   History: Past Medical History:  Diagnosis Date  . Acute on chronic diastolic CHF (congestive heart failure) ([place]) 05/19/2012  . Acute on chronic respiratory failure with hypoxia ([place]) 01/31/2020  . Adenomatous polyp of colon 12/16/2002   Dr. [name]/[facility]  . Allergy   . Calculus of gallbladder with chronic cholecystitis without obstruction 02/09/2009   Qualifier: Diagnosis of  By: [name], [name]    . Cataract   . CHB (complete heart block) ([place]) 10/07/2014      . CHF (congestive heart failure) ([place])    a.  EF previously reduced to 25-30% b. EF improved to 60-65% by echo in 10/2017.   Marland Kitchen Chronic kidney disease, stage I 2006  . Constipation       . COPD (chronic obstructive pulmonary disease) ([place])       . DJD (degenerative joint disease) of lumbar spine   . DM (diabetes mellitus) ([place]) 2006  . Esophageal reflux   . Glaucoma   . Gout       . H/O adenomatous polyp of colon 05/24/2012   2004 in [place] colonoscopy 2008 normal    . H/O heart valve replacement with mechanical valve    a. s/p mechanical AVR in 2001 and mechanical MVR in 2004.  Marland Kitchen H/O wisdom tooth extraction   . HIP PAIN, RIGHT 04/23/2009   Qualifier: Diagnosis of  By: [name], [name]    . Hyperlipemia   . IDA (iron deficiency anemia)    Parenteral iron/[name]  . Insomnia       . Nausea without vomiting 06/07/2010   Qualifier: Diagnosis of  By: [name], [name]    . Non-ischemic cardiomyopathy ([place])     a. s/p MDT CRTD  . Obesity, unspecified   . Other and unspecified hyperlipidemia   . Oxygen deficiency 2014   nocturnal;  . Papilloma of breast 03/06/2012   This was diagnosed as a left breast papilloma by ultrasound characteristics and symptoms in 2010. Because of possible medical risk factors no surgical intervention has been done and it did doing annual followups. The area has been stable by physical examination and mammograms and ultrasound since 2010.   Marland Kitchen Spinal stenosis of lumbar region 02/19/2013  . Spondylolisthesis   . Spondylosis   . Thyroid cancer ([place])    remote thyroidectomy, no recurrence, pt denies in 2016 thyroid cancer  . Type 2 diabetes mellitus  with hyperglycemia ([place]) 07/15/2010  . Unspecified hypothyroidism       . 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 Vertigo        Past Surgical History:  Procedure Laterality Date  . AORTIC AND MITRAL VALVE REPLACEMENT  2001  . BI-VENTRICULAR IMPLANTABLE CARDIOVERTER DEFIBRILLATOR UPGRADE N/A 01/18/2015   Procedure: BI-VENTRICULAR IMPLANTABLE CARDIOVERTER DEFIBRILLATOR UPGRADE;  Surgeon: Evans Lance, MD;  Location: [facility] CATH LAB;  Service: Cardiovascular;  Laterality: N/A;  . BIV ICD GENERATOR CHANGEOUT N/A 01/30/2019   Procedure: BIV ICD GENERATOR CHANGEOUT;  Surgeon: Evans Lance, MD;  Location: [place] CV LAB;  Service: Cardiovascular;  Laterality: N/A;  . CARDIAC CATHETERIZATION    . CARDIAC VALVE REPLACEMENT    . CARDIOVERSION N/A 11/13/2016   Procedure: CARDIOVERSION;  Surgeon: Sanda Klein, MD;  Location: [facility] ENDOSCOPY;  Service: Cardiovascular;  Laterality: N/A;  . COLONOSCOPY  06/12/2007   Rourk- Normal rectum/Normal colon  . COLONOSCOPY  12/16/02   small hemorrhoids  . COLONOSCOPY  06/20/2012   Procedure: COLONOSCOPY;  Surgeon: Daneil Dolin, MD;  Location: [facility] ENDO SUITE;  Service: Endoscopy;  Laterality: N/A;  10:45  . defibrillator implanted 2006    . DOPPLER ECHOCARDIOGRAPHY  2012    . DOPPLER ECHOCARDIOGRAPHY  05,06,07,08,09,2011  . ESOPHAGOGASTRODUODENOSCOPY  06/12/2007   Rourk- normal esophagus, small hiatal hernia, otherwise normal stomach, D1, D2  . EYE SURGERY Right 2005  . EYE SURGERY Left 2006  . ICD LEAD REMOVAL Left 02/08/2015   Procedure: ICD LEAD REMOVAL;  Surgeon: Evans Lance, MD;  Location: [facility] OR;  Service: Cardiovascular;  Laterality: Left;  "Will plan extraction and insertion of a BiV PM"  **Dr. Roxan Hockey backing up case**  . IMPLANTABLE CARDIOVERTER DEFIBRILLATOR (ICD) GENERATOR CHANGE Left 02/08/2015   Procedure: ICD GENERATOR CHANGE;  Surgeon: Evans Lance, MD;  Location: [place];  Service: Cardiovascular;  Laterality: Left;  . INSERT / REPLACE / REMOVE PACEMAKER    . PACEMAKER INSERTION  May 2016  . pacemaker placed  2004  . right breast cyst removed     benign   . right cataract removed     2005  . TEE WITHOUT CARDIOVERSION N/A 11/13/2016   Procedure: TRANSESOPHAGEAL ECHOCARDIOGRAM (TEE);  Surgeon: Sanda Klein, MD;  Location: [facility] ENDOSCOPY;  Service: Cardiovascular;  Laterality: N/A;  . THYROIDECTOMY     Family History  Problem Relation Age of Onset  . Pancreatic cancer Mother   . Heart disease Father   . Heart disease Sister   . Heart attack Sister   . Heart disease Brother   . Diabetes Brother   . Heart disease Brother   . Diabetes Brother   . Hypertension Brother   . Lung cancer Brother    Social History   Socioeconomic History  . Marital status: Widowed    Spouse name: Not on file  . Number of children: 0  . Years of education: Not on file  . Highest education level: Not on file  Occupational History  . Occupation: retired    Fish farm manager: RETIRED  Tobacco Use  . Smoking status: Former Smoker    Packs/day: 0.75    Years: 40.00    Pack years: 30.00    Types: Cigarettes    Quit date: 03/08/1987    Years since quitting: 33.1  . Smokeless tobacco: Never Used  Vaping Use  . Vaping Use: Never used  Substance and Sexual  Activity  . Alcohol use: No  Alcohol/week: 0.0 standard drinks  . Drug use: No  . Sexual activity: Not Currently  Other Topics Concern  . Not on file  Social History Narrative   From [place] (2004)   Social Determinants of Health   Financial Resource Strain: Low Risk   . Difficulty of Paying Living Expenses: Not hard at all  Food Insecurity: No Food Insecurity  . Worried About Charity fundraiser in the Last Year: Never true  . Ran Out of Food in the Last Year: Never true  Transportation Needs: No Transportation Needs  . Lack of Transportation (Medical): No  . Lack of Transportation (Non-Medical): No  Physical Activity: Inactive  . Days of Exercise per Week: 0 days  . Minutes of Exercise per Session: 0 min  Stress: No Stress Concern Present  . Feeling of Stress : Only a little  Social Connections: Moderately Isolated  . Frequency of Communication with Friends and Family: More than three times a week  . Frequency of Social Gatherings with Friends and Family: Twice a week  . Attends Religious Services: More than 4 times per year  . Active Member of Clubs or Organizations: No  . Attends Archivist Meetings: Never  . Marital Status: Widowed    Tobacco Counseling Counseling given: Not Answered   Clinical Intake:  Pre-visit preparation completed: No  Pain : 0-10 Pain Score: 9  Pain Location: Knee     Nutritional Status: BMI > 30  Obese Diabetes: No  How often do you need to have someone help you when you read instructions, pamphlets, or other written materials from your doctor or pharmacy?: 2 - Rarely  Diabetic? No-prediabetic         Activities of Daily Living In your present state of health, do you have any difficulty performing the following activities: 05/10/2020 03/30/2020  Hearing? N N  Vision? N N  Difficulty concentrating or making decisions? N N  Walking or climbing stairs? Y Y  Dressing or bathing? Y Y  Doing errands, shopping? Y N    Preparing Food and eating ? Y -  Using the Toilet? Y -  In the past six months, have you accidently leaked urine? Y -  Do you have problems with loss of bowel control? N -  Managing your Medications? Y -  Managing your Finances? N -  Housekeeping or managing your Housekeeping? Y -  Some recent data might be hidden    Patient Care Team: Fayrene Helper, MD as PCP - General (Family Medicine) Croitoru, Dani Gobble, MD as PCP - Cardiology (Cardiology) Evans Lance, MD as PCP - Electrophysiology (Cardiology) Evans Lance, MD as Consulting Physician (Cardiology) Sheldon Silvan Manon Hilding, PA-C as Physician Assistant (Oncology) Madelin Headings, DO (Optometry) Fran Lowes, MD (Inactive) as Consulting Physician (Nephrology) Nickel, Sharmon Leyden, NP (Inactive) as Nurse Practitioner (Vascular Surgery) Sanjuana Kava, MD as Consulting Physician (Orthopedic Surgery) Herminio Commons, MD (Inactive) as Attending Physician (Cardiology) Early, Arvilla Meres, MD as Consulting Physician (Vascular Surgery) Clista Bernhardt, MD as Consulting Physician [facility] Ophthalmology) Sanda Klein, MD as Consulting Physician (Cardiology) System, Provider Not In Serena Colonel, RN as [place] any recent [place] you may have received from other than [facility] providers in the past year (date may be approximate).     Assessment:   This is a routine wellness examination for [name] [name].  Hearing/Vision screen No exam data present  Dietary issues and exercise activities discussed: Current Exercise Habits:  The patient does not participate in regular exercise at present, Exercise limited by: orthopedic condition(s);respiratory conditions(s)  Goals    . Exercise 3x per week (30 min per time)     Recommend starting a routine exercise program at least 3 days a week for 30-45 minutes at a time as tolerated.      . Have 3 meals a day    . Prevent falls     Use  walker when ambulating.      Depression Screen PHQ 2/9 Scores 05/10/2020 02/26/2020 02/16/2020 10/08/2019 05/20/2019 03/25/2019 02/05/2019  PHQ - 2 Score 0 0 0 0 0 0 0  PHQ- 9 Score - - 0 - - - -    Fall Risk Fall Risk  05/10/2020 02/26/2020 02/18/2020 02/16/2020 01/15/2020  Falls in the past year? 0 0 0 0 0  Number falls in past yr: 0 0 0 0 0  Injury with Fall? 0 0 0 0 0  Risk for fall due to : - - - - -  Follow up - - - Falls prevention discussed -    Any stairs in or around the home? No  If so, are there any without handrails? No  Home free of loose throw rugs in walkways, pet beds, electrical cords, etc? Yes  Adequate lighting in your home to reduce risk of falls? Yes   ASSISTIVE DEVICES UTILIZED TO PREVENT FALLS:  Life alert? Yes  Use of a cane, walker or w/c? Yes  Grab bars in the bathroom? No  Shower chair or bench in shower? Yes  Elevated toilet seat or a handicapped toilet? No   TIMED UP AND GO:  Was the test performed? No .  Length of time to ambulate 10 feet:  sec.     Cognitive Function:     6CIT Screen 05/10/2020 02/05/2019 02/01/2018 03/12/2017  What Year? 0 points 0 points 0 points 0 points  What month? 0 points 0 points 0 points 0 points  What time? 0 points 0 points 0 points 0 points  Count back from 20 0 points 0 points 0 points 0 points  Months in reverse 0 points 0 points 2 points 0 points  Repeat phrase 0 points 0 points 2 points 0 points  Total Score 0 0 4 0    Immunizations Immunization History  Administered Date(s) Administered  . Fluad Quad(high Dose 65+) 05/19/2019  . Influenza Split 06/19/2011, 07/17/2012  . Influenza Whole 06/20/2007, 06/22/2008, 06/24/2009, 05/27/2010  . Influenza, High Dose Seasonal PF 07/17/2018  . Influenza,inj,Quad PF,6+ Mos 07/15/2013, 06/03/2014, 06/21/2015, 07/06/2016, 11/17/2016, 06/29/2017  . Moderna SARS-COVID-2 Vaccination 12/04/2019, 01/06/2020  . Pneumococcal Conjugate-13 10/13/2014  . Pneumococcal Polysaccharide-23  06/10/2008, 05/31/2019  . Td 08/31/2004  . Tdap 02/17/2015, 11/17/2019    TDAP status: Up to date Flu Vaccine status: Up to date Pneumococcal vaccine status: Up to date Covid-19 vaccine status: Completed vaccines  Qualifies for Shingles Vaccine? Yes  patient declines Zostavax completed No   Shingrix Completed?: No.    Education has been provided regarding the importance of this vaccine. Patient has been advised to call insurance company to determine out of pocket expense if they have not yet received this vaccine. Advised may also receive vaccine at local pharmacy or Health Dept. Verbalized acceptance and understanding.  Screening Tests Health Maintenance  Topic Date Due  . INFLUENZA VACCINE  04/25/2020  . TETANUS/TDAP  11/16/2029  . DEXA SCAN  Completed  . COVID-19 Vaccine  Completed  . PNA vac Low  Risk Adult  Completed    Health Maintenance  Health Maintenance Due  Topic Date Due  . INFLUENZA VACCINE  04/25/2020    Colorectal cancer screening: No longer required.  Mammogram status: No longer required.  Bone denisty- no longer required   Lung Cancer Screening: (Low Dose CT Chest recommended if Age 68-80 years, 30 pack-year currently smoking OR have quit w/in 15years.) does not qualify.   Lung Cancer Screening Referral: does not qualify  Additional Screening:  Hepatitis C Screening: does qualify; Has been ordered  Vision Screening: Recommended annual ophthalmology exams for early detection of glaucoma and other disorders of the eye. Is the patient up to date with their annual eye exam?  Yes  Who is the provider or what is the name of the office in which the patient attends annual eye exams? Dr Jorja Loa  If pt is not established with a provider, would they like to be referred to a provider to establish care? No .   Dental Screening: Recommended annual dental exams for proper oral hygiene  Community Resource Referral / Chronic Care Management: CRR required this visit?   No   CCM required this visit?  No      Plan:     I have personally reviewed and noted the following in the patient's chart:   . Medical and social history . Use of alcohol, tobacco or illicit drugs  . Current medications and supplements . Functional ability and status . Nutritional status . Physical activity . Advanced directives . List of other physicians . Hospitalizations, surgeries, and ER visits in previous 12 months . Vitals . Screenings to include cognitive, depression, and falls . Referrals and appointments  In addition, I have reviewed and discussed with patient certain preventive protocols, quality metrics, and best practice recommendations. A written personalized care plan for preventive services as well as general preventive health recommendations were provided to patient.     Kate Sable, LPN, LPN   [id number]/63/[id number]   Nurse Notes: visit done by telephone. Patient at home and provider in the office. Time spent with patient 30 mins.

## 2020-05-11 ENCOUNTER — Telehealth: Payer: Self-pay

## 2020-05-11 ENCOUNTER — Other Ambulatory Visit: Payer: Self-pay | Admitting: Family Medicine

## 2020-05-11 ENCOUNTER — Telehealth: Payer: Self-pay | Admitting: Family Medicine

## 2020-05-11 MED ORDER — BENZONATATE 100 MG PO CAPS
100.0000 mg | ORAL_CAPSULE | Freq: Two times a day (BID) | ORAL | 0 refills | Status: DC | PRN
Start: 2020-05-11 — End: 2020-07-08

## 2020-05-11 NOTE — Telephone Encounter (Signed)
Pt wanting to know if she can get an increase on her Home Health care hours, shes is currently on getting 4 hours per day and was getting.[employer] ([employer])

## 2020-05-11 NOTE — Telephone Encounter (Signed)
Patient aware.

## 2020-05-11 NOTE — Telephone Encounter (Signed)
X 1 week, off and on congestion in chest. Sometimes at night and in the am she coughs up white mucus. No SOB but some tightness. Takes mucinex every night or every other night. States people have told her they can hear it in her chest over the phone. Denies any swelling of her ankles or hands. Please advise

## 2020-05-11 NOTE — Telephone Encounter (Signed)
Patient would like someone to call her back and recommend a medication for decongestion

## 2020-05-11 NOTE — Telephone Encounter (Signed)
I have prescribed limited quantity of tessalon perles

## 2020-05-12 NOTE — Telephone Encounter (Signed)
I called [employer] where she said her aide is from and left a message for the administrator to call me back regarding this pt on the backline number. I have not heard anything back

## 2020-05-14 ENCOUNTER — Encounter (HOSPITAL_COMMUNITY): Payer: Self-pay

## 2020-05-14 ENCOUNTER — Other Ambulatory Visit (HOSPITAL_COMMUNITY): Payer: Medicare Other

## 2020-05-14 ENCOUNTER — Ambulatory Visit (INDEPENDENT_AMBULATORY_CARE_PROVIDER_SITE_OTHER): Payer: Medicare Other | Admitting: *Deleted

## 2020-05-14 ENCOUNTER — Inpatient Hospital Stay (HOSPITAL_COMMUNITY): Payer: Medicare Other | Attending: Hematology

## 2020-05-14 ENCOUNTER — Other Ambulatory Visit: Payer: Self-pay

## 2020-05-14 ENCOUNTER — Ambulatory Visit: Payer: Medicare Other | Admitting: General Practice

## 2020-05-14 ENCOUNTER — Ambulatory Visit: Payer: Self-pay | Admitting: *Deleted

## 2020-05-14 ENCOUNTER — Inpatient Hospital Stay (HOSPITAL_COMMUNITY): Payer: Medicare Other

## 2020-05-14 VITALS — BP 146/59 | HR 60 | Temp 96.9°F | Resp 18

## 2020-05-14 DIAGNOSIS — D631 Anemia in chronic kidney disease: Secondary | ICD-10-CM | POA: Diagnosis not present

## 2020-05-14 DIAGNOSIS — I442 Atrioventricular block, complete: Secondary | ICD-10-CM | POA: Diagnosis not present

## 2020-05-14 DIAGNOSIS — D649 Anemia, unspecified: Secondary | ICD-10-CM

## 2020-05-14 DIAGNOSIS — N184 Chronic kidney disease, stage 4 (severe): Secondary | ICD-10-CM

## 2020-05-14 DIAGNOSIS — E611 Iron deficiency: Secondary | ICD-10-CM

## 2020-05-14 LAB — CUP PACEART REMOTE DEVICE CHECK
Battery Remaining Longevity: 54 mo
Battery Voltage: 2.97 V
Brady Statistic AP VP Percent: 0 %
Brady Statistic AP VS Percent: 0 %
Brady Statistic AS VP Percent: 99.74 %
Brady Statistic AS VS Percent: 0.26 %
Brady Statistic RA Percent Paced: 0 %
Brady Statistic RV Percent Paced: 99.74 %
Date Time Interrogation Session: 20210819231809
Implantable Lead Implant Date: 20040521
Implantable Lead Implant Date: 20071218
Implantable Lead Implant Date: 20160516
Implantable Lead Location: 753858
Implantable Lead Location: 753859
Implantable Lead Location: 753860
Implantable Lead Model: 5076
Implantable Lead Model: 6947
Implantable Pulse Generator Implant Date: 20200506
Lead Channel Impedance Value: 285 Ohm
Lead Channel Impedance Value: 285 Ohm
Lead Channel Impedance Value: 304 Ohm
Lead Channel Impedance Value: 323 Ohm
Lead Channel Impedance Value: 342 Ohm
Lead Channel Impedance Value: 361 Ohm
Lead Channel Impedance Value: 437 Ohm
Lead Channel Impedance Value: 456 Ohm
Lead Channel Impedance Value: 513 Ohm
Lead Channel Impedance Value: 627 Ohm
Lead Channel Impedance Value: 646 Ohm
Lead Channel Impedance Value: 684 Ohm
Lead Channel Impedance Value: 684 Ohm
Lead Channel Impedance Value: 779 Ohm
Lead Channel Pacing Threshold Amplitude: 1.625 V
Lead Channel Pacing Threshold Amplitude: 2.125 V
Lead Channel Pacing Threshold Pulse Width: 0.4 ms
Lead Channel Pacing Threshold Pulse Width: 1 ms
Lead Channel Sensing Intrinsic Amplitude: 4.375 mV
Lead Channel Sensing Intrinsic Amplitude: 4.375 mV
Lead Channel Setting Pacing Amplitude: 2.5 V
Lead Channel Setting Pacing Amplitude: 3 V
Lead Channel Setting Pacing Pulse Width: 0.8 ms
Lead Channel Setting Pacing Pulse Width: 1 ms
Lead Channel Setting Sensing Sensitivity: 4 mV

## 2020-05-14 LAB — CBC
HCT: 30.3 % — ABNORMAL LOW (ref 36.0–46.0)
Hemoglobin: 9.3 g/dL — ABNORMAL LOW (ref 12.0–15.0)
MCH: 28.9 pg (ref 26.0–34.0)
MCHC: 30.7 g/dL (ref 30.0–36.0)
MCV: 94.1 fL (ref 80.0–100.0)
Platelets: 153 10*3/uL (ref 150–400)
RBC: 3.22 MIL/uL — ABNORMAL LOW (ref 3.87–5.11)
RDW: 16.1 % — ABNORMAL HIGH (ref 11.5–15.5)
WBC: 4.3 10*3/uL (ref 4.0–10.5)
nRBC: 0 % (ref 0.0–0.2)

## 2020-05-14 MED ORDER — EPOETIN ALFA-EPBX 40000 UNIT/ML IJ SOLN
INTRAMUSCULAR | Status: AC
  Filled 2020-05-14: qty 1

## 2020-05-14 MED ORDER — EPOETIN ALFA-EPBX 40000 UNIT/ML IJ SOLN
40000.0000 [IU] | Freq: Once | INTRAMUSCULAR | Status: AC
  Administered 2020-05-14: 40000 [IU] via SUBCUTANEOUS

## 2020-05-14 NOTE — Patient Instructions (Signed)
[facility]  Discharge Instructions:   _______________________________________________________________  Thank you for choosing [facility] at [facility] to provide your oncology and hematology care.  To afford each patient quality time with our providers, please arrive at least 15 minutes before your scheduled appointment.  You need to re-schedule your appointment if you arrive 10 or more minutes late.  We strive to give you quality time with our providers, and arriving late affects you and other patients whose appointments are after yours.  Also, if you no show three or more times for appointments you may be dismissed from the clinic.  Again, thank you for choosing [facility] at [facility]. Our hope is that these requests will allow you access to exceptional care and in a timely manner. _______________________________________________________________  If you have questions after your visit, please contact our office at [phone] between the hours of 8:30 a.m. and 5:00 p.m. Voicemails left after 4:30 p.m. will not be returned until the following business day. _______________________________________________________________  For prescription refill requests, have your pharmacy contact our office. _______________________________________________________________  Recommendations made by the consultant and any test results will be sent to your referring physician. _______________________________________________________________ 

## 2020-05-14 NOTE — Progress Notes (Signed)
[name] [name] presents today for injection per MD orders. Reticrit 40,000 units administered SQ in right Upper Arm. Administration without incident. Patient tolerated well.  Vitals stable and discharged home from clinic via wheelchair. Follow up as scheduled.

## 2020-05-16 ENCOUNTER — Other Ambulatory Visit: Payer: Self-pay

## 2020-05-16 ENCOUNTER — Encounter (HOSPITAL_COMMUNITY): Payer: Self-pay | Admitting: Emergency Medicine

## 2020-05-16 DIAGNOSIS — R0989 Other specified symptoms and signs involving the circulatory and respiratory systems: Secondary | ICD-10-CM | POA: Diagnosis not present

## 2020-05-16 DIAGNOSIS — Z79899 Other long term (current) drug therapy: Secondary | ICD-10-CM | POA: Insufficient documentation

## 2020-05-16 DIAGNOSIS — I5023 Acute on chronic systolic (congestive) heart failure: Secondary | ICD-10-CM | POA: Insufficient documentation

## 2020-05-16 DIAGNOSIS — I517 Cardiomegaly: Secondary | ICD-10-CM | POA: Diagnosis not present

## 2020-05-16 DIAGNOSIS — E119 Type 2 diabetes mellitus without complications: Secondary | ICD-10-CM | POA: Insufficient documentation

## 2020-05-16 DIAGNOSIS — J449 Chronic obstructive pulmonary disease, unspecified: Secondary | ICD-10-CM | POA: Diagnosis not present

## 2020-05-16 DIAGNOSIS — N184 Chronic kidney disease, stage 4 (severe): Secondary | ICD-10-CM | POA: Diagnosis not present

## 2020-05-16 DIAGNOSIS — I130 Hypertensive heart and chronic kidney disease with heart failure and stage 1 through stage 4 chronic kidney disease, or unspecified chronic kidney disease: Secondary | ICD-10-CM | POA: Diagnosis not present

## 2020-05-16 DIAGNOSIS — R05 Cough: Secondary | ICD-10-CM | POA: Diagnosis not present

## 2020-05-16 DIAGNOSIS — R600 Localized edema: Secondary | ICD-10-CM | POA: Diagnosis not present

## 2020-05-16 DIAGNOSIS — I509 Heart failure, unspecified: Secondary | ICD-10-CM | POA: Diagnosis not present

## 2020-05-16 DIAGNOSIS — J811 Chronic pulmonary edema: Secondary | ICD-10-CM | POA: Diagnosis not present

## 2020-05-16 DIAGNOSIS — E039 Hypothyroidism, unspecified: Secondary | ICD-10-CM | POA: Diagnosis not present

## 2020-05-16 DIAGNOSIS — Z880 Allergy status to penicillin: Secondary | ICD-10-CM | POA: Insufficient documentation

## 2020-05-16 DIAGNOSIS — Z20822 Contact with and (suspected) exposure to covid-19: Secondary | ICD-10-CM | POA: Diagnosis not present

## 2020-05-16 DIAGNOSIS — Z7982 Long term (current) use of aspirin: Secondary | ICD-10-CM | POA: Diagnosis not present

## 2020-05-16 DIAGNOSIS — R9431 Abnormal electrocardiogram [ECG] [EKG]: Secondary | ICD-10-CM | POA: Diagnosis not present

## 2020-05-16 DIAGNOSIS — Z87891 Personal history of nicotine dependence: Secondary | ICD-10-CM | POA: Insufficient documentation

## 2020-05-16 DIAGNOSIS — R2243 Localized swelling, mass and lump, lower limb, bilateral: Secondary | ICD-10-CM | POA: Insufficient documentation

## 2020-05-16 IMAGING — MG MM DIGITAL DIAGNOSTIC UNILAT*R* W/ TOMO W/ CAD
4 series · 4 of 12 positions shown · non-contrast
Comparison: Previous exam(s).

CLINICAL DATA: Possible asymmetry in the lateral right breast in
the craniocaudal projection of a recent screening mammogram.

EXAM:
DIGITAL DIAGNOSTIC UNILATERAL RIGHT MAMMOGRAM WITH CAD AND TOMO

[R ML synth-2D]
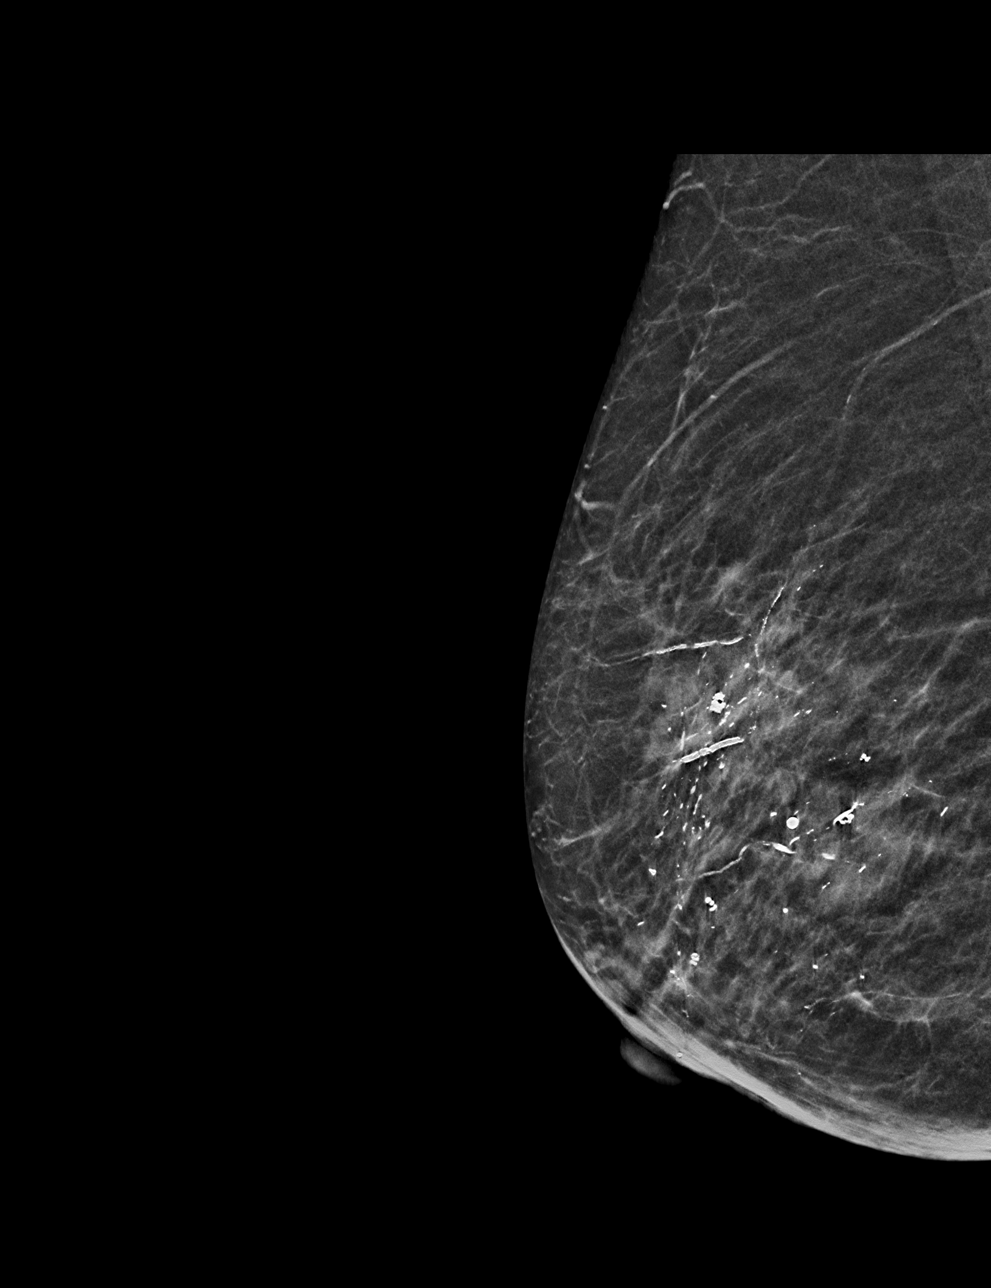

[R CC synth-2D]
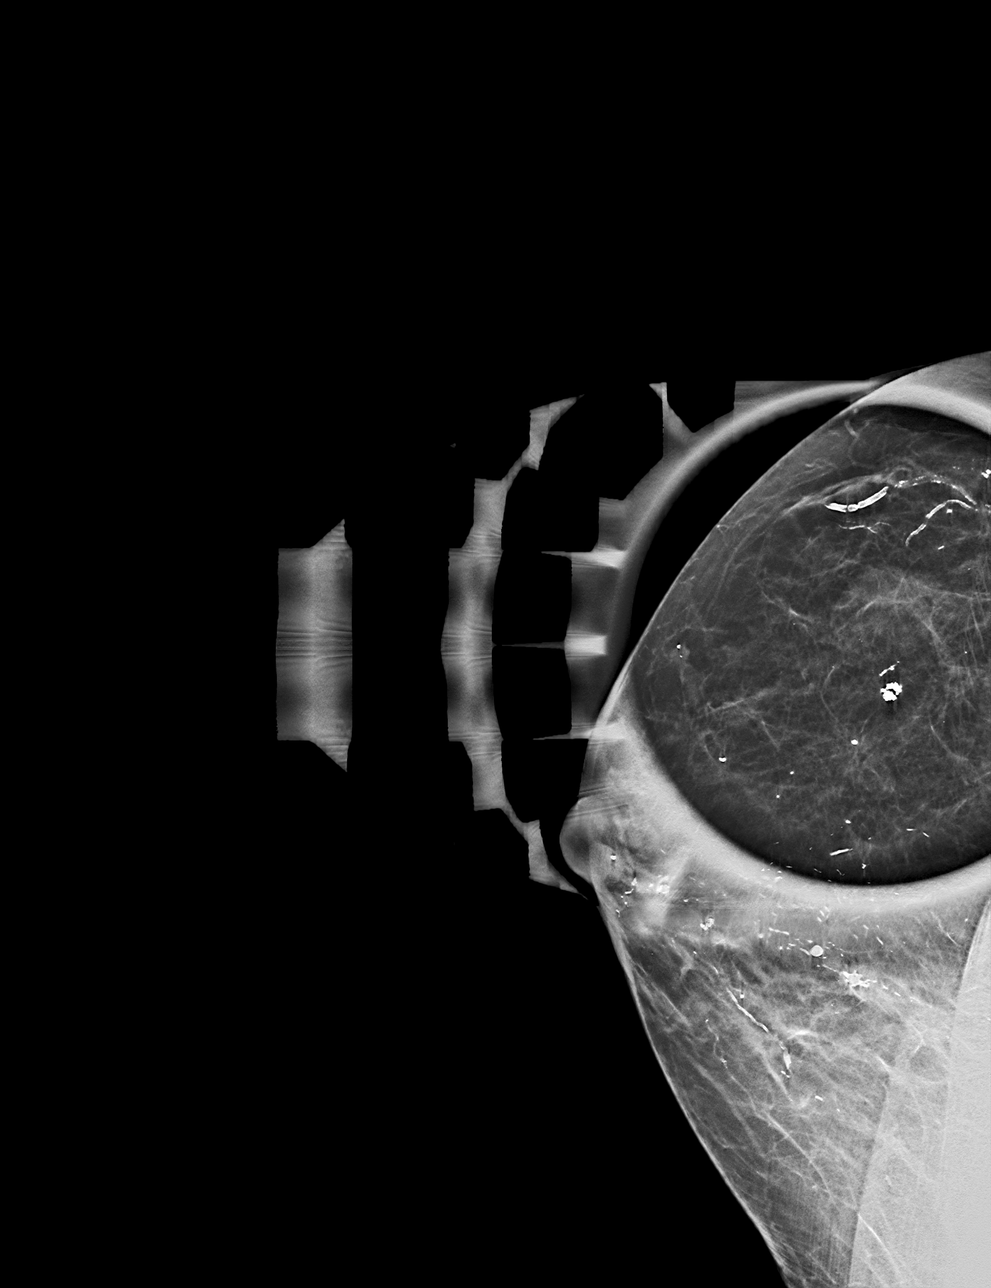

[R ML tomo · tomo slice 21/41.0]
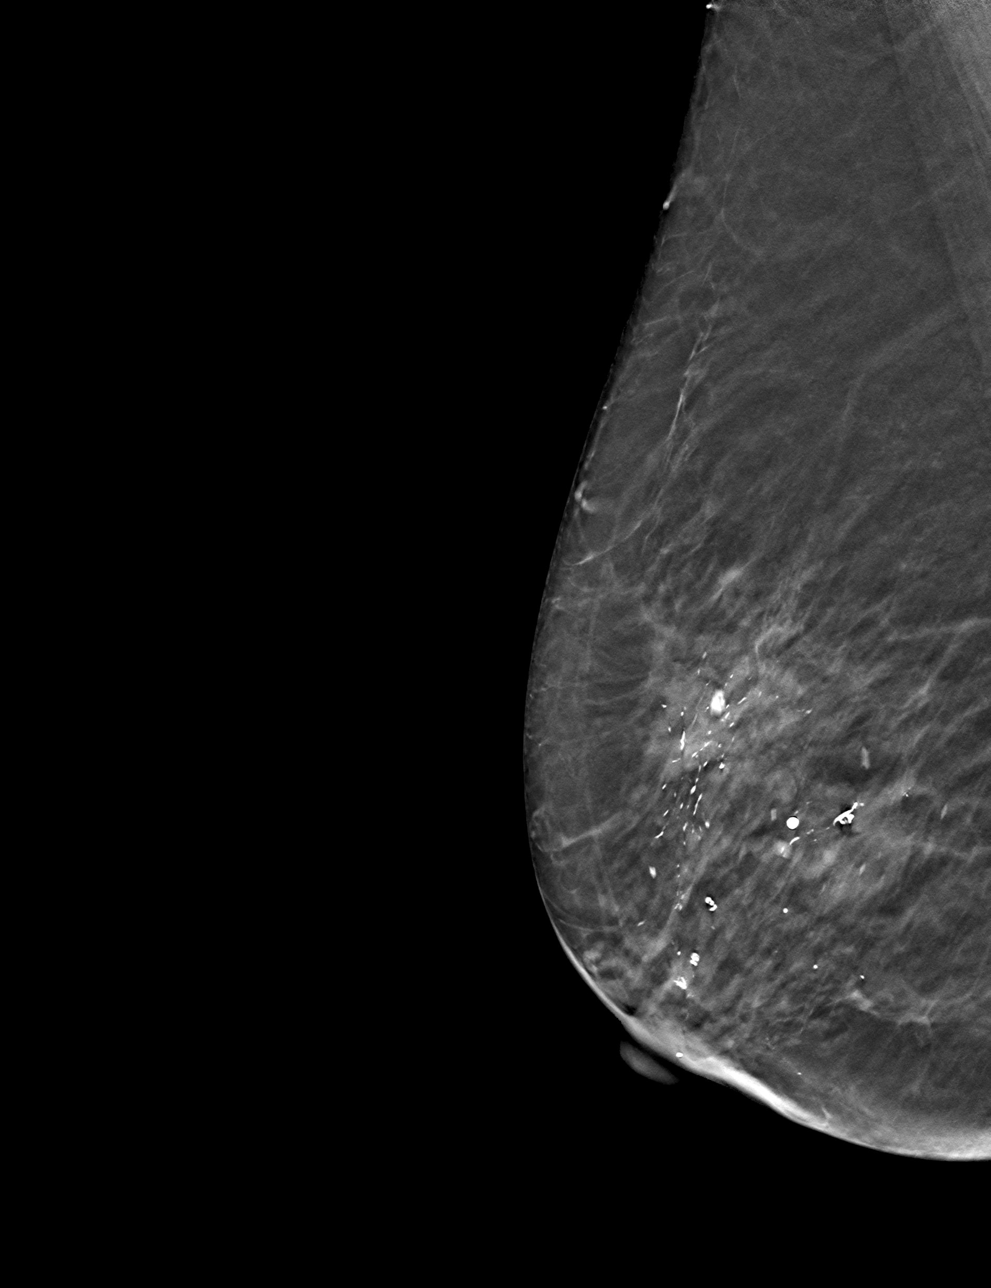

[R CC tomo · tomo slice 15/28.0]
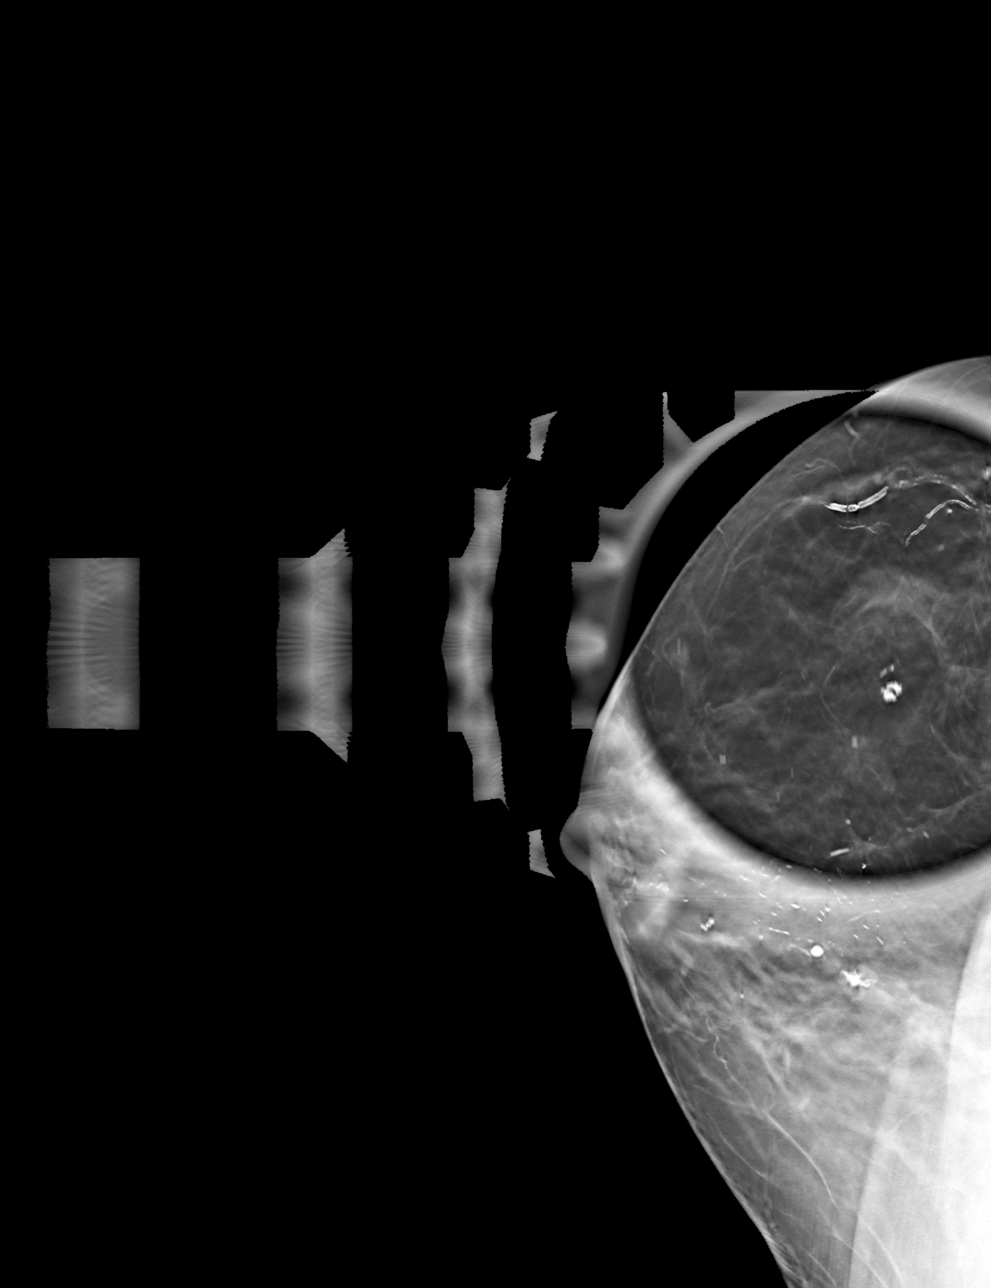

[4 of 12 positions shown; findings below may reference images not displayed]

ACR Breast Density Category c: The breast tissue is heterogeneously
dense, which may obscure small masses.
FINDINGS: 3D tomographic and 2D generated true lateral and spot compression
craniocaudal views of the right breast were obtained. These
demonstrate normal appearing fibroglandular tissue at the location
of recently suspected asymmetry.

Mammographic images were processed with CAD.
IMPRESSION: No evidence of malignancy.

RECOMMENDATION:
The recently suspected right breast asymmetry was close apposition
of normal breast tissue.

I have discussed the findings and recommendations with the patient.
If applicable, a reminder letter will be sent to the patient
regarding the next appointment.

BI-RADS CATEGORY  1: Negative.

## 2020-05-16 NOTE — ED Triage Notes (Signed)
Patient states edema in her legs and ankles over the last few days. Patient states that she has taken her lasix as prescribed.

## 2020-05-17 ENCOUNTER — Ambulatory Visit (INDEPENDENT_AMBULATORY_CARE_PROVIDER_SITE_OTHER): Payer: Medicare Other | Admitting: *Deleted

## 2020-05-17 ENCOUNTER — Other Ambulatory Visit: Payer: Self-pay | Admitting: *Deleted

## 2020-05-17 ENCOUNTER — Ambulatory Visit: Payer: Medicare Other | Admitting: General Practice

## 2020-05-17 ENCOUNTER — Emergency Department (HOSPITAL_COMMUNITY): Payer: Medicare Other

## 2020-05-17 ENCOUNTER — Emergency Department (HOSPITAL_COMMUNITY)
Admission: EM | Admit: 2020-05-17 | Discharge: 2020-05-17 | Disposition: A | Discharge status: Home / Self Care | Payer: Medicare Other | Attending: Emergency Medicine | Admitting: Emergency Medicine

## 2020-05-17 DIAGNOSIS — Z952 Presence of prosthetic heart valve: Secondary | ICD-10-CM | POA: Diagnosis not present

## 2020-05-17 DIAGNOSIS — R05 Cough: Secondary | ICD-10-CM | POA: Diagnosis not present

## 2020-05-17 DIAGNOSIS — R2243 Localized swelling, mass and lump, lower limb, bilateral: Secondary | ICD-10-CM | POA: Diagnosis not present

## 2020-05-17 DIAGNOSIS — I517 Cardiomegaly: Secondary | ICD-10-CM | POA: Diagnosis not present

## 2020-05-17 DIAGNOSIS — Z5181 Encounter for therapeutic drug level monitoring: Secondary | ICD-10-CM | POA: Diagnosis not present

## 2020-05-17 DIAGNOSIS — I509 Heart failure, unspecified: Secondary | ICD-10-CM | POA: Diagnosis not present

## 2020-05-17 DIAGNOSIS — J811 Chronic pulmonary edema: Secondary | ICD-10-CM | POA: Diagnosis not present

## 2020-05-17 DIAGNOSIS — R609 Edema, unspecified: Secondary | ICD-10-CM

## 2020-05-17 LAB — BASIC METABOLIC PANEL
Anion gap: 8 (ref 5–15)
BUN: 41 mg/dL — ABNORMAL HIGH (ref 8–23)
CO2: 32 mmol/L (ref 22–32)
Calcium: 9.4 mg/dL (ref 8.9–10.3)
Chloride: 89 mmol/L — ABNORMAL LOW (ref 98–111)
Creatinine, Ser: 1.51 mg/dL — ABNORMAL HIGH (ref 0.44–1.00)
GFR calc Af Amer: 36 mL/min — ABNORMAL LOW (ref >60)
GFR calc non Af Amer: 31 mL/min — ABNORMAL LOW (ref >60)
Glucose, Bld: 105 mg/dL — ABNORMAL HIGH (ref 70–99)
Potassium: 4.2 mmol/L (ref 3.5–5.1)
Sodium: 129 mmol/L — ABNORMAL LOW (ref 135–145)

## 2020-05-17 LAB — CBC
HCT: 33.4 % — ABNORMAL LOW (ref 36.0–46.0)
Hemoglobin: 10.3 g/dL — ABNORMAL LOW (ref 12.0–15.0)
MCH: 29.3 pg (ref 26.0–34.0)
MCHC: 30.8 g/dL (ref 30.0–36.0)
MCV: 95.2 fL (ref 80.0–100.0)
Platelets: 172 10*3/uL (ref 150–400)
RBC: 3.51 MIL/uL — ABNORMAL LOW (ref 3.87–5.11)
RDW: 16.3 % — ABNORMAL HIGH (ref 11.5–15.5)
WBC: 5.5 10*3/uL (ref 4.0–10.5)
nRBC: 0 % (ref 0.0–0.2)

## 2020-05-17 LAB — POCT INR: INR: 1.4 — AB (ref 2.0–3.0)

## 2020-05-17 LAB — SARS CORONAVIRUS 2 BY RT PCR (HOSPITAL ORDER, PERFORMED IN ~~LOC~~ HOSPITAL LAB): SARS Coronavirus 2: NEGATIVE

## 2020-05-17 MED ORDER — FUROSEMIDE 10 MG/ML IJ SOLN
80.0000 mg | Freq: Once | INTRAMUSCULAR | Status: AC
  Administered 2020-05-17: 80 mg via INTRAVENOUS
  Filled 2020-05-17: qty 8

## 2020-05-17 NOTE — Discharge Instructions (Addendum)
Follow-up with your doctor today as planned.  You were given 80 mg of Lasix here.

## 2020-05-17 NOTE — ED Provider Notes (Signed)
[facility] EMERGENCY DEPARTMENT Provider Note   CSN: [id number] Arrival date & time: 05/16/20  2046     History Chief Complaint  Patient presents with  . Leg Swelling    [name] [name] is a 86 y.o. female.  HPI Patient presents with increased swelling in her legs and ankles.  Had for last few days.  Has had some chest congestion.  States she is been taking Lasix at home.  No fevers.  No cough.  States she normally does not have swelling in her legs but has a fair amount of swelling now.  Patient is had her Covid vaccines.  No chest pain.  Occasional cough.    Past Medical History:  Diagnosis Date  . Acute on chronic diastolic CHF (congestive heart failure) ([place]) 05/19/2012  . Acute on chronic respiratory failure with hypoxia ([place]) 01/31/2020  . Adenomatous polyp of colon 12/16/2002   Dr. [name]/[facility]  . Allergy   . Calculus of gallbladder with chronic cholecystitis without obstruction 02/09/2009   Qualifier: Diagnosis of  By: [name], [name]    . Cataract   . CHB (complete heart block) ([place]) 10/07/2014      . CHF (congestive heart failure) ([place])    a.  EF previously reduced to 25-30% b. EF improved to 60-65% by echo in 10/2017.   Marland Kitchen Chronic kidney disease, stage I 2006  . Constipation       . COPD (chronic obstructive pulmonary disease) ([place])       . DJD (degenerative joint disease) of lumbar spine   . DM (diabetes mellitus) ([place]) 2006  . Esophageal reflux   . Glaucoma   . Gout       . H/O adenomatous polyp of colon 05/24/2012   2004 in [place] colonoscopy 2008 normal    . H/O heart valve replacement with mechanical valve    a. s/p mechanical AVR in 2001 and mechanical MVR in 2004.  Marland Kitchen H/O wisdom tooth extraction   . HIP PAIN, RIGHT 04/23/2009   Qualifier: Diagnosis of  By: [name], [name]    . Hyperlipemia   . IDA (iron deficiency anemia)    Parenteral iron/[name]  . Insomnia       . Nausea without vomiting  06/07/2010   Qualifier: Diagnosis of  By: [name], [name]    . Non-ischemic cardiomyopathy ([place])    a. s/p MDT CRTD  . Obesity, unspecified   . Other and unspecified hyperlipidemia   . Oxygen deficiency 2014   nocturnal;  . Papilloma of breast 03/06/2012   This was diagnosed as a left breast papilloma by ultrasound characteristics and symptoms in 2010. Because of possible medical risk factors no surgical intervention has been done and it did doing annual followups. The area has been stable by physical examination and mammograms and ultrasound since 2010.   Marland Kitchen Spinal stenosis of lumbar region 02/19/2013  . Spondylolisthesis   . Spondylosis   . Thyroid cancer ([place])    remote thyroidectomy, no recurrence, pt denies in 2016 thyroid cancer  . Type 2 diabetes mellitus with hyperglycemia ([place]) 07/15/2010  . Unspecified hypothyroidism       . 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 Vertigo         Patient Active Problem List   Diagnosis Date Noted  . Generalized osteoarthritis 05/09/2020  . Primary osteoarthritis, left shoulder 04/21/2020  . Acute systolic CHF (congestive heart failure) ([place]) 03/31/2020  .  Bilateral lower extremity edema   . Acute on chronic congestive heart failure ([place]) 03/30/2020  . Right-sided epistaxis 03/28/2020  . Acute pain of left shoulder 01/15/2020  . AAA (abdominal aortic aneurysm) without rupture ([place]) 01/14/2020  . COVID-19 virus detected 11/23/2019  . Dyspnea 05/30/2019  . Anemia 05/30/2019  . Gout   . Glaucoma   . GERD (gastroesophageal reflux disease)   . Physical debility 04/28/2019  . Hyponatremia 09/27/2018  . H/O mitral valve replacement with mechanical valve 08/02/2018  . Chronic right hip pain 04/16/2018  . Chronic systolic congestive heart failure ([place]) 11/15/2017  . Hospital discharge follow-up 11/07/2017  . Trochanteric bursitis, right hip 03/07/2017  . Persistent atrial fibrillation ([place])   . Nocturnal  hypoxia 10/08/2016  . Dependence on nocturnal oxygen therapy 10/08/2016  . Hematuria 03/20/2016  . S/P ICD (internal cardiac defibrillator) procedure 02/08/2015  . Presence of cardiac pacemaker 02/08/2015  . CHB (complete heart block) ([place]) 10/07/2014  . Fatigue 07/20/2014  . Absolute anemia 06/24/2014  . Osteopenia 02/10/2014  . Encounter for therapeutic drug monitoring 10/29/2013  . OA (osteoarthritis) of knee 02/19/2013  . Chronic pain of right knee 01/02/2013  . Hemolytic anemia ([place]) 09/24/2012  . High risk medication use 05/24/2012  . COPD (chronic obstructive pulmonary disease) ([place]) 05/19/2012  . Chronic anticoagulation, on coumadin for Mechanical AVR and MVR 04/01/2012  . Cardiorenal syndrome with renal failure 03/22/2012  . Hx of aortic valve replacement, mechanical 03/22/2012  . S/P MVR (mitral valve replacement) 03/22/2012  . Biventricular automatic implantable cardioverter defibrillator in situ 03/22/2012  . CKD (chronic kidney disease), stage IV ([place]) 03/22/2012  . Warfarin-induced coagulopathy ([place]) 03/21/2012  . Iron deficiency 10/04/2011  . Allergic rhinitis 02/14/2011  . Hypothyroidism 07/15/2010  . Mitral valve disease 07/15/2010  . Sinus node dysfunction ([place]) 07/15/2010  . VARICOSE VEINS LOWER EXTREMITIES W/OTH COMPS 02/09/2009  . Prediabetes 06/15/2008  . Hypothyroidism, postsurgical 02/24/2008  . Hyperlipidemia LDL goal <70 02/24/2008  . Essential hypertension 02/24/2008  . Non-ischemic cardiomyopathy with ICD 02/24/2008  . Spondylosis 02/24/2008    Past Surgical History:  Procedure Laterality Date  . AORTIC AND MITRAL VALVE REPLACEMENT  2001  . BI-VENTRICULAR IMPLANTABLE CARDIOVERTER DEFIBRILLATOR UPGRADE N/A 01/18/2015   Procedure: BI-VENTRICULAR IMPLANTABLE CARDIOVERTER DEFIBRILLATOR UPGRADE;  Surgeon: Evans Lance, MD;  Location: [facility] CATH LAB;  Service: Cardiovascular;  Laterality: N/A;  . BIV ICD GENERATOR CHANGEOUT N/A 01/30/2019   Procedure: BIV  ICD GENERATOR CHANGEOUT;  Surgeon: Evans Lance, MD;  Location: [place] CV LAB;  Service: Cardiovascular;  Laterality: N/A;  . CARDIAC CATHETERIZATION    . CARDIAC VALVE REPLACEMENT    . CARDIOVERSION N/A 11/13/2016   Procedure: CARDIOVERSION;  Surgeon: Sanda Klein, MD;  Location: [facility] ENDOSCOPY;  Service: Cardiovascular;  Laterality: N/A;  . COLONOSCOPY  06/12/2007   [place]k- Normal rectum/Normal colon  . COLONOSCOPY  12/16/02   small hemorrhoids  . COLONOSCOPY  06/20/2012   Procedure: COLONOSCOPY;  Surgeon: Daneil Dolin, MD;  Location: [facility] ENDO SUITE;  Service: Endoscopy;  Laterality: N/A;  10:45  . defibrillator implanted 2006    . DOPPLER ECHOCARDIOGRAPHY  2012  . DOPPLER ECHOCARDIOGRAPHY  05,06,07,08,09,2011  . ESOPHAGOGASTRODUODENOSCOPY  06/12/2007   Rourk- normal esophagus, small hiatal hernia, otherwise normal stomach, D1, D2  . EYE SURGERY Right 2005  . EYE SURGERY Left 2006  . ICD LEAD REMOVAL Left 02/08/2015   Procedure: ICD LEAD REMOVAL;  Surgeon: Evans Lance, MD;  Location: [place];  Service: Cardiovascular;  Laterality: Left;  "Will plan extraction and insertion of a BiV PM"  **Dr. Roxan Hockey backing up case**  . IMPLANTABLE CARDIOVERTER DEFIBRILLATOR (ICD) GENERATOR CHANGE Left 02/08/2015   Procedure: ICD GENERATOR CHANGE;  Surgeon: Evans Lance, MD;  Location: [place];  Service: Cardiovascular;  Laterality: Left;  . INSERT / REPLACE / REMOVE PACEMAKER    . PACEMAKER INSERTION  May 2016  . pacemaker placed  2004  . right breast cyst removed     benign   . right cataract removed     2005  . TEE WITHOUT CARDIOVERSION N/A 11/13/2016   Procedure: TRANSESOPHAGEAL ECHOCARDIOGRAM (TEE);  Surgeon: Sanda Klein, MD;  Location[facility] ENDOSCOPY;  Service: Cardiovascular;  Laterality: N/A;  . THYROIDECTOMY       OB History    Gravida      Para      Term      Preterm      AB      Living  0     SAB      TAB      Ectopic      Multiple      Live Births                Family History  Problem Relation Age of Onset  . Pancreatic cancer Mother   . Heart disease Father   . Heart disease Sister   . Heart attack Sister   . Heart disease Brother   . Diabetes Brother   . Heart disease Brother   . Diabetes Brother   . Hypertension Brother   . Lung cancer Brother     Social History   Tobacco Use  . Smoking status: Former Smoker    Packs/day: 0.75    Years: 40.00    Pack years: 30.00    Types: Cigarettes    Quit date: 03/08/1987    Years since quitting: 33.2  . Smokeless tobacco: Never Used  Vaping Use  . Vaping Use: Never used  Substance Use Topics  . Alcohol use: No    Alcohol/week: 0.0 standard drinks  . Drug use: No    Home Medications Prior to Admission medications   Medication Sig Start Date End Date Taking? Authorizing Provider  acetaminophen (TYLENOL) 500 MG tablet Take 500 mg by mouth every 6 (six) hours as needed (pain).     [provider]  albuterol (PROVENTIL) (2.5 MG/3ML) 0.083% nebulizer solution Take 3 mLs (2.5 mg total) by nebulization in the morning and at bedtime. *May use additional one time if needed for shortness of breath 04/02/20   Irwin Brakeman L, MD  albuterol (VENTOLIN HFA) 108 (90 Base) MCG/ACT inhaler Inhale 1-2 puffs into the lungs every 6 (six) hours as needed for wheezing or shortness of breath. 04/02/20   Johnson, Clanford L, MD  allopurinol (ZYLOPRIM) 100 MG tablet Take 1 tablet (100 mg total) by mouth in the morning. 04/02/20   Johnson, Clanford L, MD  benzonatate (TESSALON) 100 MG capsule Take 1 capsule (100 mg total) by mouth 2 (two) times daily as needed for cough. 05/11/20   Fayrene Helper, MD  BREO ELLIPTA 100-25 MCG/INH AEPB 1 TAKE 1 INHALATION BY MOUTH ONCE DAILY 08/25/19   Fayrene Helper, MD  cholecalciferol (VITAMIN D3) 25 MCG (1000 UT) tablet Take 1,000 Units by mouth in the morning.     [provider]  docusate sodium (COLACE) 100 MG capsule Take 300 mg by mouth  at bedtime.  [provider]  fluticasone (FLONASE) 50 MCG/ACT nasal spray SHAKE LIQUID AND USE 2 SPRAYS IN EACH NOSTRIL DAILY 11/24/19   Fayrene Helper, MD  folic acid (FOLVITE) 1 MG tablet TAKE 1 TABLET(1 MG) BY MOUTH DAILY 11/10/19   Fayrene Helper, MD  furosemide (LASIX) 40 MG tablet Take 40 mg by mouth 2 (two) times daily. Take 80mg  BID x3DAYS THEN BACK TO 40MG  BID    [provider]  gabapentin (NEURONTIN) 300 MG capsule Take 1 capsule (300 mg total) by mouth 2 (two) times daily. 03/10/20   Fayrene Helper, MD  hydrALAZINE (APRESOLINE) 25 MG tablet Take 1 tablet (25 mg total) by mouth 2 (two) times daily. 05/19/19   Croitoru, Mihai, MD  isosorbide mononitrate (IMDUR) 30 MG 24 hr tablet Take 0.5 tablets (15 mg total) by mouth every morning. 02/04/20   Pokhrel, Corrie Mckusick, MD  Liniments (SALONPAS PAIN RELIEF PATCH EX) Apply 1 patch topically daily as needed (pain).    [provider]  meclizine (ANTIVERT) 12.5 MG tablet TAKE 1 TABLET(12.5 MG) BY MOUTH THREE TIMES DAILY AS NEEDED FOR DIZZINESS 03/01/20   Fayrene Helper, MD  metolazone (ZAROXOLYN) 2.5 MG tablet Take one tablet on Saturdays. 11/24/19   Croitoru, Mihai, MD  metoprolol succinate (TOPROL-XL) 100 MG 24 hr tablet TAKE 1 TABLET BY MOUTH EVERY DAY WITH FOOD OR MILK 05/05/20   Fayrene Helper, MD  nitroGLYCERIN (NITROSTAT) 0.4 MG SL tablet Place 1 tablet (0.4 mg total) under the tongue every 5 (five) minutes as needed for chest pain. 08/03/19   Rolland Porter, MD  ondansetron (ZOFRAN ODT) 4 MG disintegrating tablet Take 1 tablet (4 mg total) by mouth every 8 (eight) hours as needed for nausea or vomiting. 01/26/20   Fawze, Mina A, PA-C  Polyethyl [name] (SYSTANE) 0.4-0.3 % GEL ophthalmic gel Place 1 application into the left eye at bedtime.    [provider]  polyethylene glycol powder (GLYCOLAX/MIRALAX) 17 GM/SCOOP powder Take 17 g by mouth daily as needed for mild constipation or  moderate constipation.    [provider]  potassium chloride SA (KLOR-CON) 20 MEQ tablet Take 1 tablet (20 mEq total) by mouth daily. TAKE 40MEQ DAILY x3 DAYS THEN BACK TO 20MEQ DAILY 04/23/20   Deberah Pelton, NP  pravastatin (PRAVACHOL) 40 MG tablet TAKE 1 TABLET BY MOUTH EVERY EVENING 11/10/19   Fayrene Helper, MD  SYNTHROID 75 MCG tablet Take 1 tablet (75 mcg total) by mouth daily before breakfast. 04/02/20   Wynetta Emery, Clanford L, MD  warfarin (JANTOVEN) 5 MG tablet Take 1-2 tablets (5-10 mg total) by mouth See admin instructions. TAKE 2 TABLETS EVERY DAY EXCEPT 1 TABLET ON TUESDAYS @ 8:30PM 04/02/20   Johnson, Clanford L, MD    Allergies    Penicillins, Aspirin, Fentanyl, and Niacin  Review of Systems   Review of Systems  Constitutional: Negative for appetite change.  HENT: Negative for congestion.   Respiratory: Positive for cough. Negative for shortness of breath.   Cardiovascular: Positive for leg swelling. Negative for chest pain.  Gastrointestinal: Negative for abdominal pain.  Genitourinary: Negative for flank pain.  Skin: Negative for rash.  Neurological: Negative for weakness.  Psychiatric/Behavioral: Negative for confusion.    Physical Exam Updated Vital Signs BP (!) 158/67   Pulse (!) 59   Temp 98.1 F (36.7 C) (Oral)   Resp 18   Ht 5\' 7"  (1.702 m)   Wt 89.4 kg   SpO2 100%  BMI 30.85 kg/m   Physical Exam Vitals and nursing note reviewed.  HENT:     Head: Normocephalic.  Eyes:     Pupils: Pupils are equal, round, and reactive to light.  Pulmonary:     Comments: Mild rales bilaterally but worse on left. Abdominal:     Tenderness: There is no abdominal tenderness.  Musculoskeletal:     Cervical back: Neck supple.     Right lower leg: Edema present.     Left lower leg: Edema present.     Comments: Pitting edema bilateral lower extremities.  Skin:    Capillary Refill: Capillary refill takes less than 2 seconds.  Neurological:     Mental  Status: She is alert and oriented to person, place, and time.     ED Results / Procedures / Treatments   Labs (all labs ordered are listed, but only abnormal results are displayed) Labs Reviewed  CBC - Abnormal; Notable for the following components:      Result Value   RBC 3.51 (*)    Hemoglobin 10.3 (*)    HCT 33.4 (*)    RDW 16.3 (*)    All other components within normal limits  BASIC METABOLIC PANEL - Abnormal; Notable for the following components:   Sodium 129 (*)    Chloride 89 (*)    Glucose, Bld 105 (*)    BUN 41 (*)    Creatinine, Ser 1.51 (*)    GFR calc non Af Amer 31 (*)    GFR calc Af Amer 36 (*)    All other components within normal limits  SARS CORONAVIRUS 2 BY RT PCR [facility] ORDER, [place] LAB)    EKG EKG Interpretation  Date/Time:  Sunday May 16 2020 22:48:32 EDT Ventricular Rate:  60 PR Interval:    QRS Duration: 168 QT Interval:  532 QTC Calculation: 532 R Axis:   -40 Text Interpretation: Ventricular-paced rhythm Abnormal ECG Confirmed by Davonna Belling [phone]) on 05/17/2020 1:47:27 AM   Radiology DG Chest Portable 1 View  Result Date: 05/17/2020 CLINICAL DATA:  Cough and CHF EXAM: PORTABLE CHEST 1 VIEW COMPARISON:  03/30/2020 FINDINGS: Cardiomegaly with biventricular AICD. Remote median sternotomy and valve replacement. Mild interstitial opacity without overt pulmonary edema. No pleural effusion or pneumothorax IMPRESSION: Cardiomegaly without overt pulmonary edema. Electronically Signed   By: Ulyses Jarred M.D.   On: 05/17/2020 02:58    Procedures Procedures (including critical care time)  Medications Ordered in ED Medications  furosemide (LASIX) injection 80 mg (80 mg Intravenous Given 05/17/20 0444)    ED Course  I have reviewed the triage vital signs and the nursing notes.  Pertinent labs & imaging results that were available during my care of the patient were reviewed by me and considered in my medical  decision making (see chart for details).    MDM Rules/Calculators/A&P                          Patient presents with shortness of breath and a mild cough.  Chronic oxygen.  X-ray shows some mild cardiomegaly.  No pulmonary edema.  But has a fair amount of edema on her legs.  Weight is up.  IM Lasix given.  States she has follow-up with her doctor this morning.  Do not have criteria for admission at this time, such as hypoxia although she has been on increasing oral doses of medicine.  I think hopefully can be followed  as an outpatient.  Covid test also negative.  Discharge home Final Clinical Impression(s) / ED Diagnoses Final diagnoses:  Peripheral edema    Rx / DC Orders ED Discharge Orders    None       Davonna Belling, MD 05/17/20 [phone]

## 2020-05-17 NOTE — Patient Instructions (Signed)
Take warfarin 3 tablets tonight, 2 1/2 tablets tomorrow night then resume 2 tablets daily  Continue greens Recheck in 3 weeks

## 2020-05-17 NOTE — Patient Outreach (Signed)
[place] [facility]) Care Management  05/17/2020  [name] [name] [birth year] [id number]   Subjective: Telephone call to patient's home  number, spoke with patient, verified name, declined to verify date of birth, or address at this time.  Patient states she remembers speaking with caller in the past, has been at the hospital ED all night, not feeling up to talking at this time, and requested call back at a later time.    Objective: Per KPN (Knowledge Performance Now, point of care tool) and chart review, patient hospitalized 03/30/2020 - 04/02/2020 for Acute on chronic congestive heart failure, Chronic systolic congestive heart failure, Acute systolic CHF (congestive heart failure), Bilateral lower extremity edema .    Patient also has a history of hypertension, aortic valve replacement, mechanical, status post MVR (mitral valve replacement), Biventricular automatic implantable cardioverter defibrillator in situ, CKD (chronic kidney disease), stage IV, Chronic anticoagulation, on coumadin for Mechanical AVR and MVR,  COPD (chronic obstructive pulmonary disease), Persistent atrial fibrillation, Chronic hypoxic respiratory failure, chronic supplemental oxygen 2L/m, diabetes, Anemia of chronic disease , and Hypothyroidism.      Assessment: Received [facility] Liaison screening follow up referral on 04/05/2020.  Referral source: [name] [name].   Referral reason: Please assign to telephonic RN for post hospital follow up. Patient discharged home on 04/02/20 with Home Health services, chronic oxygen Transition Of Ccare by Primary Care Provider at [facility].   Screening  follow up pending patient contact.      Plan: RNCM has sent unsuccessful outreach  letter, [facility] pamphlet, will call patient for 3rd telephone outreach attempt within 4 business days per patient's request, screening follow up, and will proceed with case closure, after 4th unsuccessful outreach  call.      [name] [name], BSN, [place] [facility] Telephonic CM Phone: [phone] Fax: [phone]

## 2020-05-17 NOTE — Progress Notes (Signed)
Remote pacemaker transmission.   

## 2020-05-18 ENCOUNTER — Telehealth: Payer: Self-pay

## 2020-05-18 NOTE — Telephone Encounter (Signed)
Pt is calling to see if the nurse will call her back, she needs the nurse to call the supervisor over her aide

## 2020-05-19 NOTE — Telephone Encounter (Signed)
We did not get her on this program. She applied for this program in 2017 -[employer] has a free program provided by the government and they have a limit on the amount of hours she can receive. There is nothing we can do to change her allowed aide hours.

## 2020-05-21 ENCOUNTER — Other Ambulatory Visit: Payer: Self-pay | Admitting: *Deleted

## 2020-05-21 NOTE — Patient Outreach (Signed)
[place] [facility]) Care Management  05/21/2020  [name] [name] Nov 26, 1931 [id number]   Subjective: Telephone call to patient's home  number, spoke with patient, and HIPAA verified.   States she is doing very well, currently on the other line, and requested call back at a later time.       Objective:Per KPN (Knowledge Performance Now, point of care tool) and chart review,patient hospitalized 03/30/2020 - 04/02/2020 forAcute on chronic congestive heart failure,Chronic systolic congestive heart failure,Acute systolic CHF (congestive heart failure),Bilateral lower extremity edema. Patient also has a history of hypertension, aortic valve replacement, mechanical, status postMVR (mitral valve replacement),Biventricular automatic implantable cardioverter defibrillator in situ,CKD (chronic kidney disease), stage IV,Chronic anticoagulation, on coumadin for Mechanical AVR and MVR,COPD (chronic obstructive pulmonary disease),Persistent atrial fibrillation,Chronic hypoxic respiratory failure,chronic supplemental oxygen 2L/m, diabetes,Anemia of chronic disease, andHypothyroidism.      Assessment: Received [facility] Liaison screening follow up referral on 04/05/2020. Referral source: Joylene Draft. Referral reason: Please assign to telephonic RN for post hospital follow up. Patient discharged home on 04/02/20 with Home Health services, chronic oxygen TransitionOfCcareby [facility]Provider[facility].Screening follow up pending patient contact.      Plan:RNCM has sent unsuccessful outreach letter, [facility] pamphlet, will call patient for  4th telephone outreach attempt within 21business daysper patient's request, screening follow up, and will proceed with case closure, after 4th unsuccessful outreach call.      Mela Perham H. Annia Friendly, BSN, [place] [facility] Telephonic CM Phone: [phone] Fax:  [phone]

## 2020-05-24 ENCOUNTER — Ambulatory Visit (INDEPENDENT_AMBULATORY_CARE_PROVIDER_SITE_OTHER): Payer: Medicare Other

## 2020-05-24 DIAGNOSIS — Z950 Presence of cardiac pacemaker: Secondary | ICD-10-CM | POA: Diagnosis not present

## 2020-05-24 DIAGNOSIS — I5042 Chronic combined systolic (congestive) and diastolic (congestive) heart failure: Secondary | ICD-10-CM

## 2020-05-24 NOTE — Progress Notes (Signed)
EPIC Encounter for ICM Monitoring  Patient Name: [name] [name] is a 86 y.o. female Date: 05/24/2020 Primary Care Physican: Fayrene Helper, MD Primary Cardiologist:Croitoru Electrophysiologist:Taylor Bi-V Pacing:99.2% 6/23/2021Weight:185.8lbs    Transmission Reviewed.  ED visit 05/17/2020 for peripheral edema.  Optivol thoracic impedancenormal.  Prescribed:  Furosemide20 mg 2 tablets (40 mg total) by mouth twice a day.   Metolazaone 2.5 mg Take 1tabletoncea weekonSaturdays.   Potassium 20 mEq take 1 tablet daily.  Labs: 05/17/2020 Creatinine 1.51, BUN 41, Potassium 4.2, Sodium 129, GFR 31-36 04/14/2020 Creatinine 1.51, BUN 57, Potassium 4.6, Sodium 135, GFR 31-36 04/02/2020 Creatinine 1.40, BUN 42, Potassium 4.3, Sodium 131, GFR 34-39  04/01/2020 Creatinine 1.40, BUN 38, Potassium 4.3, Sodium 131, GFR 34-39  03/31/2020 Creatinine 1.40, BUN 40, Potassium 5.0, Sodium 131, GFR 34-39  03/30/2020 Creatinine 1.50, BUN 44, Potassium 5.4, Sodium 130, GFR 31-36  03/14/2020 Creatinine1.37, BUN41, Potassium5.0, Sodium135, GFR35-40 03/10/2020 Creatinine1.28, BUN46, Potassium4.5, [id number], [id number]  02/16/2020 Creatinine1.72, BUN50, Potassium5.1, Sodium134, [id number] A complete set of results can be found in Results Review.  Recommendations: Patient has 8/31 office visit with Dr Sallyanne Kuster.   Follow-up plan: ICM clinic phone appointment on10/12/2019.91 day device clinic remote transmission11/19/2021.  EP/Cardiology Office Visits: 05/25/2020 with Dr Sallyanne Kuster.    Copy of ICM check sent to Dr. Lovena Le.   3 month ICM trend: 05/24/2020    1 Year ICM trend:       [name], RN 05/24/2020 4:05 PM

## 2020-05-25 ENCOUNTER — Telehealth: Payer: Self-pay | Admitting: Cardiovascular Disease

## 2020-05-25 ENCOUNTER — Other Ambulatory Visit: Payer: Self-pay

## 2020-05-25 ENCOUNTER — Encounter: Payer: Self-pay | Admitting: Cardiovascular Disease

## 2020-05-25 ENCOUNTER — Ambulatory Visit (INDEPENDENT_AMBULATORY_CARE_PROVIDER_SITE_OTHER): Payer: Medicare Other | Admitting: Cardiovascular Disease

## 2020-05-25 VITALS — BP 161/70 | HR 64 | Ht 67.0 in | Wt 193.0 lb

## 2020-05-25 DIAGNOSIS — Z7901 Long term (current) use of anticoagulants: Secondary | ICD-10-CM

## 2020-05-25 DIAGNOSIS — N1832 Chronic kidney disease, stage 3b: Secondary | ICD-10-CM

## 2020-05-25 DIAGNOSIS — E669 Obesity, unspecified: Secondary | ICD-10-CM

## 2020-05-25 DIAGNOSIS — I10 Essential (primary) hypertension: Secondary | ICD-10-CM

## 2020-05-25 DIAGNOSIS — Z952 Presence of prosthetic heart valve: Secondary | ICD-10-CM

## 2020-05-25 DIAGNOSIS — I4821 Permanent atrial fibrillation: Secondary | ICD-10-CM

## 2020-05-25 DIAGNOSIS — I442 Atrioventricular block, complete: Secondary | ICD-10-CM | POA: Diagnosis not present

## 2020-05-25 DIAGNOSIS — D594 Other nonautoimmune hemolytic anemias: Secondary | ICD-10-CM | POA: Diagnosis not present

## 2020-05-25 DIAGNOSIS — Z950 Presence of cardiac pacemaker: Secondary | ICD-10-CM | POA: Diagnosis not present

## 2020-05-25 DIAGNOSIS — I5042 Chronic combined systolic (congestive) and diastolic (congestive) heart failure: Secondary | ICD-10-CM

## 2020-05-25 NOTE — Telephone Encounter (Signed)
Patient states she was drying her hands in the bathroom after seeing Dr. Sallyanne Kuster - and she has lost her ring. It is gold with a black set. Spoke with Glass blower/designer, he advised they will look in the bathrooms.

## 2020-05-25 NOTE — Progress Notes (Signed)
Thank you.  Looked okay in the office today, just elderly and weak.

## 2020-05-25 NOTE — Patient Instructions (Signed)
Medication Instructions:  No changes *If you need a refill on your cardiac medications before your next appointment, please call your pharmacy*   Lab Work: None ordered If you have labs (blood work) drawn today and your tests are completely normal, you will receive your results only by:  [place] (if you have MyChart) OR  A paper copy in the mail If you have any lab test that is abnormal or we need to change your treatment, we will call you to review the results.   Testing/Procedures: No changes   Follow-Up: At [facility], you and your health needs are our priority.  As part of our continuing mission to provide you with exceptional heart care, we have created designated Provider Care Teams.  These Care Teams include your primary Cardiologist (physician) and Advanced Practice Providers (APPs -  Physician Assistants and Nurse Practitioners) who all work together to provide you with the care you need, when you need it.  We recommend signing up for the patient portal called "MyChart".  Sign up information is provided on this After Visit Summary.  MyChart is used to connect with patients for Virtual Visits (Telemedicine).  Patients are able to view lab/test results, encounter notes, upcoming appointments, etc.  Non-urgent messages can be sent to your provider as well.   To learn more about what you can do with MyChart, go to [email or web address].    Your next appointment:   6 month(s)  The format for your next appointment:   In Person  Provider:   You may see Sanda Klein, MD or one of the following Advanced Practice Providers on your designated Care Team:    Almyra Deforest, PA-C  Fabian Sharp, PA-C or   Roby Lofts, [place]

## 2020-05-25 NOTE — Progress Notes (Signed)
Cardiology Office Note    Date:  05/25/2020   ID:  [name] [name], DOB 10-23-1931, MRN [id number]  PCP:  Fayrene Helper, MD  Cardiologist:  Cristopher Peru, M.D. (EP/ICD); Sanda Klein, MD   No chief complaint on file.   History of Present Illness:  [name] [name] is a 86 y.o. female returning in follow-up for valvular heart disease, persistent atrial fibrillation, complete heart block and chronic combined systolic and diastolic heart failure.  She has been relatively stable in the last 2 or 3 months.  Continues to have NYHA 2-3 dyspnea, using O2 at 2L/min at home.  She has mild edema towards the end of the day, resolves after lying in bed overnight.  She did go to the emergency room at [facility] on 05/17/2020 for leg edema and increased weight.  The emergency room physician was not particularly worried about her fluid overload and she received 1 intramuscular dose of furosemide.  Her BUN and creatinine was at baseline but her sodium was low at 129.  Potassium was normal.  She has not had dizziness or syncope or falls and is not aware of palpitations.  She is taking metolazone once a week on Saturdays.  Monthly Optivol downloads have shown stability over the last 3 months.  Her weight has been steady in the 190s.  Her most recent creatinine was a little better at 1.51.    On March 18, 2019 she had an echocardiogram that showed normal left ventricular ejection fraction of 55%.  Both prosthetic valves were functioning normally.  Her biventricular pacemaker was implanted on Jan 30, 2019 by Dr. Lovena Le.  The device is programmed VVIR.    There is been some volatility in her LV tracing threshold assessments, but otherwise device function is normal.  The intracardiac electrogram continues to show atrial flutter she has not had ventricular tachycardia, except for a single brief 9 beat nonsustained event.  Estimated generator longevity is 4-1/2 years.  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  She is on chronic warfarin anticoagulation but her most recent INR was subtherapeutic at 1.4.  She is followed in the [facility] Coumadin clinic.  She is pacemaker dependent due to complete heart block following aortic and mitral valve replacement with mechanical prostheses. (AVR 2001, MVR 2004, both [employer] prostheses, surgeries performed in [place]). She had severe cardiomyopathy with an estimated left ventricular ejection fraction of 25%, but the most recent echo in March 18, 2019 showed improved LVEF to 55%.  She did not have any coronary artery disease by angiography before her first operation. In May 2016 her initial pacemaker lead was extracted and she received a CRT-D Medtronic Viva XT device by Dr. Cristopher Peru.  She has had problems with multifactorial anemia, including a component of prosthesis related hemolytic anemia, baseline hemoglobin around 10.   Past Medical History:  Diagnosis Date  . Acute on chronic diastolic CHF (congestive heart failure) ([place]) 05/19/2012  . Acute on chronic respiratory failure with hypoxia ([place]) 01/31/2020  . Adenomatous polyp of colon 12/16/2002   Dr. [name]/[facility]  . Allergy   . Calculus of gallbladder with chronic cholecystitis without obstruction 02/09/2009   Qualifier: Diagnosis of  By: [name], [name]    . Cataract   . CHB (complete  heart block) ([place]) 10/07/2014      . CHF (congestive heart failure) ([place])    a.  EF previously reduced to 25-30% b. EF improved to 60-65% by echo in 10/2017.   Marland Kitchen Chronic kidney disease, stage I 2006  . Constipation       . COPD (chronic obstructive pulmonary disease) ([place])       . DJD (degenerative joint disease) of lumbar spine   . DM (diabetes mellitus) ([place]) 2006  . Esophageal reflux   . Glaucoma   .  Gout       . H/O adenomatous polyp of colon 05/24/2012   2004 in [place] colonoscopy 2008 normal    . H/O heart valve replacement with mechanical valve    a. s/p mechanical AVR in 2001 and mechanical MVR in 2004.  Marland Kitchen H/O wisdom tooth extraction   . HIP PAIN, RIGHT 04/23/2009   Qualifier: Diagnosis of  By: [name], [name]    . Hyperlipemia   . IDA (iron deficiency anemia)    Parenteral iron/[name]  . Insomnia       . Nausea without vomiting 06/07/2010   Qualifier: Diagnosis of  By: [name], [name]    . Non-ischemic cardiomyopathy ([place])    a. s/p MDT CRTD  . Obesity, unspecified   . Other and unspecified hyperlipidemia   . Oxygen deficiency 2014   nocturnal;  . Papilloma of breast 03/06/2012   This was diagnosed as a left breast papilloma by ultrasound characteristics and symptoms in 2010. Because of possible medical risk factors no surgical intervention has been done and it did doing annual followups. The area has been stable by physical examination and mammograms and ultrasound since 2010.   Marland Kitchen Spinal stenosis of lumbar region 02/19/2013  . Spondylolisthesis   . Spondylosis   . Thyroid cancer ([place])    remote thyroidectomy, no recurrence, pt denies in 2016 thyroid cancer  . Type 2 diabetes mellitus with hyperglycemia ([place]) 07/15/2010  . Unspecified hypothyroidism       . 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 Vertigo         Past Surgical History:  Procedure Laterality Date  . AORTIC AND MITRAL VALVE REPLACEMENT  2001  . BI-VENTRICULAR IMPLANTABLE CARDIOVERTER DEFIBRILLATOR UPGRADE N/A 01/18/2015   Procedure: BI-VENTRICULAR IMPLANTABLE CARDIOVERTER DEFIBRILLATOR UPGRADE;  Surgeon: Evans Lance, MD;  Location[facility] C[facility] LAB;  Service: Cardiovascular;  Laterality: N/A;  . BIV ICD GENERATOR CHANGEOUT N/A 01/30/2019   Procedure: BIV ICD GENERATOR CHANGEOUT;  Surgeon: Evans Lance, MD;  Location: [place] CV LAB;  Service:  Cardiovascular;  Laterality: N/A;  . CARDIAC CATHETERIZATION    . CARDIAC VALVE REPLACEMENT    . CARDIOVERSION N/A 11/13/2016   Procedure: CARDIOVERSION;  Surgeon: Sanda Klein, MD;  Location: [facility] ENDOSCOPY;  Service: Cardiovascular;  Laterality: N/A;  . COLONOSCOPY  06/12/2007   [place]k- Normal rectum/Normal colon  . COLONOSCOPY  12/16/02   small hemorrhoids  . COLONOSCOPY  06/20/2012   Procedure: COLONOSCOPY;  Surgeon: Daneil Dolin, MD;  Location: [facility] ENDO [facility];  Service: Endoscopy;  Laterality: N/A;  10:45  . defibrillator implanted 2006    . DOPPLER ECHOCARDIOGRAPHY  2012  . DOPPLER ECHOCARDIOGRAPHY  05,06,07,08,09,2011  . ESOPHAGOGASTRODUODENOSCOPY  06/12/2007   Rourk- normal esophagus, small hiatal hernia, otherwise normal stomach, D1, D2  . EYE SURGERY Right 2005  . EYE SURGERY Left 2006  . ICD LEAD REMOVAL Left 02/08/2015   Procedure:  ICD LEAD REMOVAL;  Surgeon: Evans Lance, MD;  Location: [facility];  Service: Cardiovascular;  Laterality: Left;  "Will plan extraction and insertion of a BiV PM"  **Dr. Roxan Hockey backing up case**  . IMPLANTABLE CARDIOVERTER DEFIBRILLATOR (ICD) GENERATOR CHANGE Left 02/08/2015   Procedure: ICD GENERATOR CHANGE;  Surgeon: Evans Lance, MD;  Location: [place];  Service: Cardiovascular;  Laterality: Left;  . INSERT / REPLACE / REMOVE PACEMAKER    . PACEMAKER INSERTION  May 2016  . pacemaker placed  2004  . right breast cyst removed     benign   . right cataract removed     2005  . TEE WITHOUT CARDIOVERSION N/A 11/13/2016   Procedure: TRANSESOPHAGEAL ECHOCARDIOGRAM (TEE);  Surgeon: Sanda Klein, MD;  Location: [facility] ENDOSCOPY;  Service: Cardiovascular;  Laterality: N/A;  . THYROIDECTOMY      Current Medications: Outpatient Medications Prior to Visit  Medication Sig Dispense Refill  . acetaminophen (TYLENOL) 500 MG tablet Take 500 mg by mouth every 6 (six) hours as needed (pain).     Marland Kitchen albuterol (PROVENTIL) (2.5 MG/3ML) 0.083% nebulizer solution  Take 3 mLs (2.5 mg total) by nebulization in the morning and at bedtime. *May use additional one time if needed for shortness of breath    . albuterol (VENTOLIN HFA) 108 (90 Base) MCG/ACT inhaler Inhale 1-2 puffs into the lungs every 6 (six) hours as needed for wheezing or shortness of breath.    . allopurinol (ZYLOPRIM) 100 MG tablet Take 1 tablet (100 mg total) by mouth in the morning.    . benzonatate (TESSALON) 100 MG capsule Take 1 capsule (100 mg total) by mouth 2 (two) times daily as needed for cough. 20 capsule 0  . BREO ELLIPTA 100-25 MCG/INH AEPB 1 TAKE 1 INHALATION BY MOUTH ONCE DAILY 1 each 4  . cholecalciferol (VITAMIN D3) 25 MCG (1000 UT) tablet Take 1,000 Units by mouth in the morning.     . docusate sodium (COLACE) 100 MG capsule Take 300 mg by mouth at bedtime.     . fluticasone (FLONASE) 50 MCG/ACT nasal spray SHAKE LIQUID AND USE 2 SPRAYS IN EACH NOSTRIL DAILY 48 g 1  . folic acid (FOLVITE) 1 MG tablet TAKE 1 TABLET(1 MG) BY MOUTH DAILY 100 tablet 5  . furosemide (LASIX) 40 MG tablet Take 40 mg by mouth 2 (two) times daily. Take 80mg  BID x3DAYS THEN BACK TO 40MG  BID    . gabapentin (NEURONTIN) 300 MG capsule Take 1 capsule (300 mg total) by mouth 2 (two) times daily. 60 capsule 3  . hydrALAZINE (APRESOLINE) 25 MG tablet Take 1 tablet (25 mg total) by mouth 2 (two) times daily. 180 tablet 3  . isosorbide mononitrate (IMDUR) 30 MG 24 hr tablet Take 0.5 tablets (15 mg total) by mouth every morning. 30 tablet 2  . Liniments (SALONPAS PAIN RELIEF PATCH EX) Apply 1 patch topically daily as needed (pain).    . meclizine (ANTIVERT) 12.5 MG tablet TAKE 1 TABLET(12.5 MG) BY MOUTH THREE TIMES DAILY AS NEEDED FOR DIZZINESS 30 tablet 2  . metolazone (ZAROXOLYN) 2.5 MG tablet Take one tablet on Saturdays. 12 tablet 5  . metoprolol succinate (TOPROL-XL) 100 MG 24 hr tablet TAKE 1 TABLET BY MOUTH EVERY DAY WITH FOOD OR MILK 90 tablet 0  . nitroGLYCERIN (NITROSTAT) 0.4 MG SL tablet Place 1 tablet  (0.4 mg total) under the tongue every 5 (five) minutes as needed for chest pain. 30 tablet 0  . ondansetron (ZOFRAN ODT)  4 MG disintegrating tablet Take 1 tablet (4 mg total) by mouth every 8 (eight) hours as needed for nausea or vomiting. 10 tablet 0  . Polyethyl [name] (SYSTANE) 0.4-0.3 % GEL ophthalmic gel Place 1 application into the left eye at bedtime.    . polyethylene glycol powder (GLYCOLAX/MIRALAX) 17 GM/SCOOP powder Take 17 g by mouth daily as needed for mild constipation or moderate constipation.    . potassium chloride SA (KLOR-CON) 20 MEQ tablet Take 1 tablet (20 mEq total) by mouth daily. TAKE 40MEQ DAILY x3 DAYS THEN BACK TO 20MEQ DAILY    . pravastatin (PRAVACHOL) 40 MG tablet TAKE 1 TABLET BY MOUTH EVERY EVENING 90 tablet 3  . SYNTHROID 75 MCG tablet Take 1 tablet (75 mcg total) by mouth daily before breakfast.    . warfarin (JANTOVEN) 5 MG tablet Take 1-2 tablets (5-10 mg total) by mouth See admin instructions. TAKE 2 TABLETS EVERY DAY EXCEPT 1 TABLET ON TUESDAYS @ 8:30PM     Facility-Administered Medications Prior to Visit  Medication Dose Route Frequency Provider Last Rate Last Admin  . epoetin alfa (EPOGEN,PROCRIT) injection 40,000 Units  40,000 Units Subcutaneous Once Farrel Gobble, MD         Allergies:   Penicillins, Aspirin, Fentanyl, and Niacin   Social History   Socioeconomic History  . Marital status: Widowed    Spouse name: Not on file  . Number of children: 0  . Years of education: Not on file  . Highest education level: Not on file  Occupational History  . Occupation: retired    Fish farm manager: RETIRED  Tobacco Use  . Smoking status: Former Smoker    Packs/day: 0.75    Years: 40.00    Pack years: 30.00    Types: Cigarettes    Quit date: 03/08/1987    Years since quitting: 33.2  . Smokeless tobacco: Never Used  Vaping Use  . Vaping Use: Never used  Substance and Sexual Activity  . Alcohol use: No    Alcohol/week: 0.0 standard drinks   . Drug use: No  . Sexual activity: Not Currently  Other Topics Concern  . Not on file  Social History Narrative   From [place] (2004)   Social Determinants of Health   Financial Resource Strain: Low Risk   . Difficulty of Paying Living Expenses: Not hard at all  Food Insecurity: No Food Insecurity  . Worried About Charity fundraiser in the Last Year: Never true  . Ran Out of Food in the Last Year: Never true  Transportation Needs: No Transportation Needs  . Lack of Transportation (Medical): No  . Lack of Transportation (Non-Medical): No  Physical Activity: Inactive  . Days of Exercise per Week: 0 days  . Minutes of Exercise per Session: 0 min  Stress: No Stress Concern Present  . Feeling of Stress : Only a little  Social Connections: Moderately Isolated  . Frequency of Communication with Friends and Family: More than three times a week  . Frequency of Social Gatherings with Friends and Family: Twice a week  . Attends Religious Services: More than 4 times per year  . Active Member of Clubs or Organizations: No  . Attends Archivist Meetings: Never  . Marital Status: Widowed     Family History:  The patient's family history includes Diabetes in her brother and brother; Heart attack in her sister; Heart disease in her brother, brother, father, and sister; Hypertension in her brother; Lung cancer in her brother;  Pancreatic cancer in her mother.   ROS:   Please see the history of present illness.    ROS All other systems are reviewed and are negative.   PHYSICAL EXAM:   VS:  BP (!) 161/70   Pulse 64   Ht 5\' 7"  (1.702 m)   Wt 193 lb (87.5 kg)   SpO2 (!) 88%   BMI 30.23 kg/m    She arrived to the office with an empty oxygen tank, O2 sat 94% after we connected her to our office oxygen supply. General: Alert, oriented x3, no distress, obese Head: no evidence of trauma, PERRL, EOMI, no exophtalmos or lid lag, no myxedema, no xanthelasma; normal ears, nose and  oropharynx Neck: normal jugular venous pulsations and no hepatojugular reflux; brisk carotid pulses without delay and no carotid bruits Chest: clear to auscultation, no signs of consolidation by percussion or palpation, normal fremitus, symmetrical and full respiratory excursions Cardiovascular: normal position and quality of the apical impulse, regular rhythm, prosthetic valve clicks without diastolic murmurs, early peaking faint aortic ejection murmur, no rubs or gallops Abdomen: no tenderness or distention, no masses by palpation, no abnormal pulsatility or arterial bruits, normal bowel sounds, no hepatosplenomegaly Extremities: no clubbing, cyanosis, has symmetrical 1+ pedal and ankle bilateral edema; 2+ radial, ulnar and brachial pulses bilaterally; 2+ right femoral, posterior tibial and dorsalis pedis pulses; 2+ left femoral, posterior tibial and dorsalis pedis pulses; no subclavian or femoral bruits Neurological: grossly nonfocal Psych: Normal mood and affect  Wt Readings from Last 3 Encounters:  05/25/20 193 lb (87.5 kg)  05/16/20 197 lb (89.4 kg)  05/10/20 197 lb (89.4 kg)    Studies/Labs Reviewed:   ECHO 01/31/2020  1. Left ventricular ejection fraction, by estimation, is 30 to 35%. The  left ventricle has moderately decreased function. The left ventricle  demonstrates global hypokinesis. There is mild concentric left ventricular  hypertrophy. Left ventricular  diastolic function could not be evaluated.  2. Right ventricular systolic function is normal. The right ventricular  size is normal. There is severely elevated pulmonary artery systolic  pressure. The estimated right ventricular systolic pressure is 44.0 mmHg.  3. Left atrial size was severely dilated.  4. Right atrial size was moderately dilated.  5. The mitral valve has been repaired/replaced. No evidence of mitral  valve regurgitation.  6. The aortic valve has been repaired/replaced. Aortic valve   regurgitation is not visualized.   EKG:  EKG is not ordered today.  ECG from 05/16/2020 shows atrial fibrillation with 100% ventricular paced rhythm.  There does appear to be a small initial R wave in leads V1 and V2, but the course fibrillatory waves make it hard to be sure.  The intracardiac electrogram from her most recent device download shows atrial flutter with biventricular pacing 99.7%.    OptiVol download from earlier today shows stable thoracic impedance  Recent Labs: 02/16/2020: TSH 2.32 03/30/2020: B Natriuretic Peptide 502.0 04/02/2020: Magnesium 1.8 04/14/2020: ALT 28 05/17/2020: BUN 41; Creatinine, Ser 1.51; Hemoglobin 10.3; Platelets 172; Potassium 4.2; Sodium 129   Lipid Panel    Component Value Date/Time   CHOL 124 01/05/2020 1253   CHOL 165 10/16/2016 1213   TRIG 73 01/05/2020 1253   HDL 44 (L) 01/05/2020 1253   HDL 31 (L) 10/16/2016 1213   CHOLHDL 2.8 01/05/2020 1253   VLDL 12 07/07/2019 1354   LDLCALC 65 01/05/2020 1253     ASSESSMENT:    1. Chronic combined systolic and diastolic heart failure ([place])  2. Permanent atrial fibrillation ([place])   3. CHB (complete heart block) ([facility])   4. Biventricular cardiac pacemaker in situ   5. Long term (current) use of anticoagulants   6. H/O mitral valve replacement with mechanical valve   7. Hx of aortic valve replacement, mechanical   8. Other non-autoimmune hemolytic anemias ([place])   9. Essential hypertension   10. Stage 3b chronic kidney disease   11. Mild obesity      PLAN:  In order of problems listed above:  1. CHF: Per the record, she has moderately depressed left ventricular systolic function with LVEF 30-35% with global hypokinesis.  I reviewed this last echo from May 2021 myself and disagree with the interpretation.  I think the ejection fraction is around 50% and there actually appears to be fairly good resynchronization.  Generally doing okay with once weekly metolazone and daily loop diuretic, without need  for recent dosing adjustments and with stable thoracic impedance by monthly monitoring.  She had problems with weakness when prescribed the metolazone twice a week.   On hydralazine/nitrates and metoprolol.  Avoiding RAAS inhibitors due to renal dysfunction. 2. AFib/A flutter: Permanent arrhythmia.  Rate control is not an issue since she has complete heart block and she is on full anticoagulation for her prosthetic valves. 3. CHB: Pacemaker dependent. 4. CRT-P: Reviewed recent download on August 18.  Followed by Dr. Lovena Le.   Clinically unchanged from last visit. 5. Warfarin anticoagulation: I am worried that her anticoagulation has been subtherapeutic for the last several weeks.  Will discuss with our Coumadin clinic.  She is at high risk for thromboembolic complications with 2 mechanical prostheses and atrial letter.  Thankfully there are no clinical signs of valve dysfunction and no evidence of embolic events.  Need to be a little more aggressive in managing her PT/INR.  It is possible that her liver function has improved with better heart failure compensation. 6. Mechanical mitral valve prosthesis: Gradients from May 2021 similar to Echo June 2020 (mean gradient of 5 mmHg, improved pressure half-time 53 ms 7. Mechanical aortic valve prosthesis: Stable mean aortic valve gradient 9 mmHg, dimensionless index 0.7. 8. Prosthetic valve associated hemolysis: Hemoglobin at upper limit of her usual range. 9. HTN: A little high today, but she was upset over the oxygen saturation.  Always lower at home.  Usually SBP in the 120s. 10. CKD 3b: Stable. Creatinine baseline appears to be 1.6, corresponding to a GFR approximately 30.  Most recent creatinine was slightly better at 1.5. 11. Obesity: Lost a little weight and now is very close to being "only" overweight.    Medication Adjustments/Labs and Tests Ordered: Current medicines are reviewed at length with the patient today.  Concerns regarding medicines are  outlined above.  Medication changes, Labs and Tests ordered today are listed in the Patient Instructions below. Patient Instructions  Medication Instructions:  No changes *If you need a refill on your cardiac medications before your next appointment, please call your pharmacy*   Lab Work: None ordered If you have labs (blood work) drawn today and your tests are completely normal, you will receive your results only by: Marland Kitchen MyChart Message (if you have MyChart) OR . A paper copy in the mail If you have any lab test that is abnormal or we need to change your treatment, we will call you to review the results.   Testing/Procedures: No changes   Follow-Up: At [facility], you and your health needs are our priority.  As  part of our continuing mission to provide you with exceptional heart care, we have created designated Provider Care Teams.  These Care Teams include your primary Cardiologist (physician) and Advanced Practice Providers (APPs -  Physician Assistants and Nurse Practitioners) who all work together to provide you with the care you need, when you need it.  We recommend signing up for the patient portal called "MyChart".  Sign up information is provided on this After Visit Summary.  MyChart is used to connect with patients for Virtual Visits (Telemedicine).  Patients are able to view lab/test results, encounter notes, upcoming appointments, etc.  Non-urgent messages can be sent to your provider as well.   To learn more about what you can do with MyChart, go to [email or web address].    Your next appointment:   6 month(s)  The format for your next appointment:   In Person  Provider:   You may see Sanda Klein, MD or one of the following Advanced Practice Providers on your designated Care Team:    Almyra Deforest, PA-C  Fabian Sharp, [place] or   Roby Lofts, PA-C        Signed, Sanda Klein, MD  05/25/2020 1:56 PM    [address], [place], [place]  [address] Phone: [phone]; Fax: [phone]

## 2020-05-27 ENCOUNTER — Other Ambulatory Visit: Payer: Self-pay | Admitting: *Deleted

## 2020-05-27 ENCOUNTER — Emergency Department (HOSPITAL_COMMUNITY): Payer: Medicare Other

## 2020-05-27 ENCOUNTER — Encounter (HOSPITAL_COMMUNITY): Payer: Self-pay | Admitting: *Deleted

## 2020-05-27 ENCOUNTER — Other Ambulatory Visit: Payer: Self-pay

## 2020-05-27 ENCOUNTER — Emergency Department (HOSPITAL_COMMUNITY)
Admission: EM | Admit: 2020-05-27 | Discharge: 2020-05-27 | Disposition: A | Discharge status: Home / Self Care | Payer: Medicare Other | Attending: Emergency Medicine | Admitting: Emergency Medicine

## 2020-05-27 DIAGNOSIS — W06XXXA Fall from bed, initial encounter: Secondary | ICD-10-CM | POA: Diagnosis not present

## 2020-05-27 DIAGNOSIS — N184 Chronic kidney disease, stage 4 (severe): Secondary | ICD-10-CM | POA: Diagnosis not present

## 2020-05-27 DIAGNOSIS — Z79899 Other long term (current) drug therapy: Secondary | ICD-10-CM | POA: Insufficient documentation

## 2020-05-27 DIAGNOSIS — Z87891 Personal history of nicotine dependence: Secondary | ICD-10-CM | POA: Diagnosis not present

## 2020-05-27 DIAGNOSIS — Z85850 Personal history of malignant neoplasm of thyroid: Secondary | ICD-10-CM | POA: Diagnosis not present

## 2020-05-27 DIAGNOSIS — I5033 Acute on chronic diastolic (congestive) heart failure: Secondary | ICD-10-CM | POA: Diagnosis not present

## 2020-05-27 DIAGNOSIS — J439 Emphysema, unspecified: Secondary | ICD-10-CM | POA: Diagnosis not present

## 2020-05-27 DIAGNOSIS — Y92009 Unspecified place in unspecified non-institutional (private) residence as the place of occurrence of the external cause: Secondary | ICD-10-CM | POA: Diagnosis not present

## 2020-05-27 DIAGNOSIS — I517 Cardiomegaly: Secondary | ICD-10-CM | POA: Diagnosis not present

## 2020-05-27 DIAGNOSIS — Y999 Unspecified external cause status: Secondary | ICD-10-CM | POA: Diagnosis not present

## 2020-05-27 DIAGNOSIS — S2232XA Fracture of one rib, left side, initial encounter for closed fracture: Secondary | ICD-10-CM

## 2020-05-27 DIAGNOSIS — Y9389 Activity, other specified: Secondary | ICD-10-CM | POA: Diagnosis not present

## 2020-05-27 DIAGNOSIS — J449 Chronic obstructive pulmonary disease, unspecified: Secondary | ICD-10-CM | POA: Insufficient documentation

## 2020-05-27 DIAGNOSIS — Z950 Presence of cardiac pacemaker: Secondary | ICD-10-CM | POA: Diagnosis not present

## 2020-05-27 DIAGNOSIS — R0602 Shortness of breath: Secondary | ICD-10-CM | POA: Diagnosis not present

## 2020-05-27 DIAGNOSIS — I10 Essential (primary) hypertension: Secondary | ICD-10-CM | POA: Diagnosis not present

## 2020-05-27 DIAGNOSIS — E039 Hypothyroidism, unspecified: Secondary | ICD-10-CM | POA: Insufficient documentation

## 2020-05-27 DIAGNOSIS — R0781 Pleurodynia: Secondary | ICD-10-CM | POA: Diagnosis not present

## 2020-05-27 DIAGNOSIS — I130 Hypertensive heart and chronic kidney disease with heart failure and stage 1 through stage 4 chronic kidney disease, or unspecified chronic kidney disease: Secondary | ICD-10-CM | POA: Insufficient documentation

## 2020-05-27 DIAGNOSIS — E1165 Type 2 diabetes mellitus with hyperglycemia: Secondary | ICD-10-CM | POA: Diagnosis not present

## 2020-05-27 DIAGNOSIS — E1122 Type 2 diabetes mellitus with diabetic chronic kidney disease: Secondary | ICD-10-CM | POA: Diagnosis not present

## 2020-05-27 DIAGNOSIS — Z7901 Long term (current) use of anticoagulants: Secondary | ICD-10-CM | POA: Diagnosis not present

## 2020-05-27 DIAGNOSIS — I700 Atherosclerosis of aorta: Secondary | ICD-10-CM | POA: Diagnosis not present

## 2020-05-27 DIAGNOSIS — S299XXA Unspecified injury of thorax, initial encounter: Secondary | ICD-10-CM | POA: Diagnosis present

## 2020-05-27 DIAGNOSIS — J811 Chronic pulmonary edema: Secondary | ICD-10-CM | POA: Diagnosis not present

## 2020-05-27 LAB — CBC WITH DIFFERENTIAL/PLATELET
Abs Immature Granulocytes: 0.03 10*3/uL (ref 0.00–0.07)
Basophils Absolute: 0 10*3/uL (ref 0.0–0.1)
Basophils Relative: 0 %
Eosinophils Absolute: 0 10*3/uL (ref 0.0–0.5)
Eosinophils Relative: 0 %
HCT: 31.3 % — ABNORMAL LOW (ref 36.0–46.0)
Hemoglobin: 9.7 g/dL — ABNORMAL LOW (ref 12.0–15.0)
Immature Granulocytes: 1 %
Lymphocytes Relative: 17 %
Lymphs Abs: 0.9 10*3/uL (ref 0.7–4.0)
MCH: 29.9 pg (ref 26.0–34.0)
MCHC: 31 g/dL (ref 30.0–36.0)
MCV: 96.6 fL (ref 80.0–100.0)
Monocytes Absolute: 0.5 10*3/uL (ref 0.1–1.0)
Monocytes Relative: 9 %
Neutro Abs: 3.9 10*3/uL (ref 1.7–7.7)
Neutrophils Relative %: 73 %
Platelets: 202 10*3/uL (ref 150–400)
RBC: 3.24 MIL/uL — ABNORMAL LOW (ref 3.87–5.11)
RDW: 16.7 % — ABNORMAL HIGH (ref 11.5–15.5)
WBC: 5.3 10*3/uL (ref 4.0–10.5)
nRBC: 0 % (ref 0.0–0.2)

## 2020-05-27 LAB — COMPREHENSIVE METABOLIC PANEL
ALT: 21 U/L (ref 0–44)
AST: 35 U/L (ref 15–41)
Albumin: 3.5 g/dL (ref 3.5–5.0)
Alkaline Phosphatase: 46 U/L (ref 38–126)
Anion gap: 11 (ref 5–15)
BUN: 58 mg/dL — ABNORMAL HIGH (ref 8–23)
CO2: 30 mmol/L (ref 22–32)
Calcium: 9.1 mg/dL (ref 8.9–10.3)
Chloride: 95 mmol/L — ABNORMAL LOW (ref 98–111)
Creatinine, Ser: 1.78 mg/dL — ABNORMAL HIGH (ref 0.44–1.00)
GFR calc Af Amer: 29 mL/min — ABNORMAL LOW (ref >60)
GFR calc non Af Amer: 25 mL/min — ABNORMAL LOW (ref >60)
Glucose, Bld: 109 mg/dL — ABNORMAL HIGH (ref 70–99)
Potassium: 4.3 mmol/L (ref 3.5–5.1)
Sodium: 136 mmol/L (ref 135–145)
Total Bilirubin: 0.4 mg/dL (ref 0.3–1.2)
Total Protein: 7.4 g/dL (ref 6.5–8.1)

## 2020-05-27 LAB — TROPONIN I (HIGH SENSITIVITY)
Troponin I (High Sensitivity): 62 ng/L — ABNORMAL HIGH (ref <18)
Troponin I (High Sensitivity): 56 ng/L — ABNORMAL HIGH (ref <18)

## 2020-05-27 LAB — BRAIN NATRIURETIC PEPTIDE: B Natriuretic Peptide: 572 pg/mL — ABNORMAL HIGH (ref 0.0–100.0)

## 2020-05-27 MED ORDER — ACETAMINOPHEN 325 MG PO TABS
650.0000 mg | ORAL_TABLET | Freq: Once | ORAL | Status: AC
  Administered 2020-05-27: 650 mg via ORAL
  Filled 2020-05-27: qty 2

## 2020-05-27 NOTE — ED Triage Notes (Signed)
Patient in oon 2 liters oxygen at home. Oxygen sat in the 70's on arrival. Placed on o2 via cannula at 4 liters

## 2020-05-27 NOTE — ED Triage Notes (Signed)
States she fell out of the bed last night, c/o pain under left ribs and right leg

## 2020-05-27 NOTE — Discharge Instructions (Addendum)
Return if any problems. Take your medications when you get home

## 2020-05-27 NOTE — ED Provider Notes (Signed)
[facility] EMERGENCY DEPARTMENT Provider Note   CSN: [id number] Arrival date & time: 05/27/20  1307     History Chief Complaint  Patient presents with   [name] [name]    [name] [name] is a 86 y.o. female.  Pt reports she rolle out of bed and hit her left ribs. Pt reports she was lying to close to the edge of the bed.  Pt is on 02 2 liters at home.   The history is provided by the patient. No language interpreter was used.  Fall This is a new problem. The current episode started 1 to 2 hours ago. The problem occurs constantly. The problem has not changed since onset.Nothing aggravates the symptoms. Nothing relieves the symptoms. She has tried nothing for the symptoms. The treatment provided no relief.       Past Medical History:  Diagnosis Date   Acute on chronic diastolic CHF (congestive heart failure) ([place]) 05/19/2012   Acute on chronic respiratory failure with hypoxia ([place]) 01/31/2020   Adenomatous polyp of colon 12/16/2002   Dr. [name]/[facility]   Allergy    Calculus of gallbladder with chronic cholecystitis without obstruction 02/09/2009   Qualifier: Diagnosis of  By: [name], Margaret     Cataract    CHB (complete heart block) ([place]) 10/07/2014       CHF (congestive heart failure) ([place])    a.  EF previously reduced to 25-30% b. EF improved to 60-65% by echo in 10/2017.    Chronic kidney disease, stage I 2006   Constipation        COPD (chronic obstructive pulmonary disease) (HCC)        DJD (degenerative joint disease) of lumbar spine    DM (diabetes mellitus) ([place]) 2006   Esophageal reflux    Glaucoma    Gout        H/O adenomatous polyp of colon 05/24/2012   2004 in [place] colonoscopy 2008 normal     H/O heart valve replacement with mechanical valve    a. s/p mechanical AVR in 2001 and mechanical MVR in 2004.   H/O wisdom tooth extraction    HIP PAIN, RIGHT 04/23/2009   Qualifier: Diagnosis of  By: [name]  MD, Margaret     Hyperlipemia    IDA (iron deficiency anemia)    Parenteral iron/[name]   Insomnia        Nausea without vomiting 06/07/2010   Qualifier: Diagnosis of  By: [name], Margaret     Non-ischemic cardiomyopathy [facility])    a. s/p MDT CRTD   Obesity, unspecified    Other and unspecified hyperlipidemia    Oxygen deficiency 2014   nocturnal;   Papilloma of breast 03/06/2012   This was diagnosed as a left breast papilloma by ultrasound characteristics and symptoms in 2010. Because of possible medical risk factors no surgical intervention has been done and it did doing annual followups. The area has been stable by physical examination and mammograms and ultrasound since 2010.    Spinal stenosis of lumbar region 02/19/2013   Spondylolisthesis    Spondylosis    Thyroid cancer ([place])    remote thyroidectomy, no recurrence, pt denies in 2016 thyroid cancer   Type 2 diabetes mellitus with hyperglycemia ([place]) 07/15/2010   Unspecified hypothyroidism        Varicose veins of lower extremities with other complications    Vertigo         Patient Active Problem List  Diagnosis Date Noted   Generalized osteoarthritis 05/09/2020   Primary osteoarthritis, left shoulder 64/40/3474   Acute systolic CHF (congestive heart failure) ([place]) 03/31/2020   Bilateral lower extremity edema    Acute on chronic congestive heart failure ([place]) 03/30/2020   Right-sided epistaxis 03/28/2020   Acute pain of left shoulder 01/15/2020   AAA (abdominal aortic aneurysm) without rupture ([place]) 01/14/2020   COVID-19 virus detected 11/23/2019   Dyspnea 05/30/2019   Anemia 05/30/2019   Gout    Glaucoma    GERD (gastroesophageal reflux disease)    Physical debility 04/28/2019   Hyponatremia 09/27/2018   H/O mitral valve replacement with mechanical valve 08/02/2018   Chronic right hip pain 25/95/6387   Chronic systolic congestive heart failure ([place]) 11/15/2017    Hospital discharge follow-up 11/07/2017   Trochanteric bursitis, right hip 03/07/2017   Persistent atrial fibrillation (HCC)    Nocturnal hypoxia 10/08/2016   Dependence on nocturnal oxygen therapy 10/08/2016   Hematuria 03/20/2016   S/P ICD (internal cardiac defibrillator) procedure 02/08/2015   Presence of cardiac pacemaker 02/08/2015   CHB (complete heart block) ([place]) 10/07/2014   Fatigue 07/20/2014   Absolute anemia 06/24/2014   Osteopenia 02/10/2014   Encounter for therapeutic drug monitoring 10/29/2013   OA (osteoarthritis) of knee 02/19/2013   Chronic pain of right knee 01/02/2013   Hemolytic anemia ([place]) 09/24/2012   High risk medication use 05/24/2012   COPD (chronic obstructive pulmonary disease) ([place]) 05/19/2012   Chronic anticoagulation, on coumadin for Mechanical AVR and MVR 04/01/2012   Cardiorenal syndrome with renal failure 03/22/2012   Hx of aortic valve replacement, mechanical 03/22/2012   S/P MVR (mitral valve replacement) 03/22/2012   Biventricular automatic implantable cardioverter defibrillator in situ 03/22/2012   CKD (chronic kidney disease), stage IV ([place]) 03/22/2012   Warfarin-induced coagulopathy ([place]) 03/21/2012   Iron deficiency 10/04/2011   Allergic rhinitis 02/14/2011   Hypothyroidism 07/15/2010   Mitral valve disease 07/15/2010   Sinus node dysfunction ([place]) 07/15/2010   VARICOSE VEINS LOWER EXTREMITIES W/OTH COMPS 02/09/2009   Prediabetes 06/15/2008   Hypothyroidism, postsurgical 02/24/2008   Hyperlipidemia LDL goal <70 02/24/2008   Essential hypertension 02/24/2008   Non-ischemic cardiomyopathy with ICD 02/24/2008   Spondylosis 02/24/2008    Past Surgical History:  Procedure Laterality Date   AORTIC AND MITRAL VALVE REPLACEMENT  2001   BI-VENTRICULAR IMPLANTABLE CARDIOVERTER DEFIBRILLATOR UPGRADE N/A 01/18/2015   Procedure: BI-VENTRICULAR IMPLANTABLE CARDIOVERTER DEFIBRILLATOR UPGRADE;  Surgeon: Evans Lance, MD;  Location[facility] CATH LAB;  Service: Cardiovascular;  Laterality: N/A;   BIV ICD GENERATOR CHANGEOUT N/A 01/30/2019   Procedure: BIV ICD GENERATOR CHANGEOUT;  Surgeon: Evans Lance, MD;  Location: [place] CV LAB;  Service: Cardiovascular;  Laterality: N/A;   CARDIAC CATHETERIZATION     CARDIAC VALVE REPLACEMENT     CARDIOVERSION N/A 11/13/2016   Procedure: CARDIOVERSION;  Surgeon: Sanda Klein, MD;  Location: [facility] ENDOSCOPY;  Service: Cardiovascular;  Laterality: N/A;   COLONOSCOPY  06/12/2007   [place]- Normal rectum/Normal colon   COLONOSCOPY  12/16/02   small hemorrhoids   COLONOSCOPY  06/20/2012   Procedure: COLONOSCOPY;  Surgeon: Daneil Dolin, MD;  Location: [facility] ENDO [facility];  Service: Endoscopy;  Laterality: N/A;  10:45   defibrillator implanted 2006     DOPPLER ECHOCARDIOGRAPHY  2012   DOPPLER ECHOCARDIOGRAPHY  05,06,07,08,09,2011   ESOPHAGOGASTRODUODENOSCOPY  06/12/2007   [place]k- normal esophagus, small hiatal hernia, otherwise normal stomach, D1, D2   EYE SURGERY Right 2005   EYE SURGERY Left  2006   ICD LEAD REMOVAL Left 02/08/2015   Procedure: ICD LEAD REMOVAL;  Surgeon: Evans Lance, MD;  Location: [place];  Service: Cardiovascular;  Laterality: Left;  "Will plan extraction and insertion of a BiV PM"  **Dr. Roxan Hockey backing up case**   IMPLANTABLE CARDIOVERTER DEFIBRILLATOR (ICD) GENERATOR CHANGE Left 02/08/2015   Procedure: ICD GENERATOR CHANGE;  Surgeon: Evans Lance, MD;  Location: [place];  Service: Cardiovascular;  Laterality: Left;   INSERT / REPLACE / REMOVE PACEMAKER     PACEMAKER INSERTION  May 2016   pacemaker placed  2004   right breast cyst removed     benign    right cataract removed     2005   TEE WITHOUT CARDIOVERSION N/A 11/13/2016   Procedure: TRANSESOPHAGEAL ECHOCARDIOGRAM (TEE);  Surgeon: Sanda Klein, MD;  Location: [facility] ENDOSCOPY;  Service: Cardiovascular;  Laterality: N/A;   THYROIDECTOMY       OB History    Gravida        Para      Term      Preterm      AB      Living  0     SAB      TAB      Ectopic      Multiple      Live Births              Family History  Problem Relation Age of Onset   Pancreatic cancer Mother    Heart disease Father    Heart disease Sister    Heart attack Sister    Heart disease Brother    Diabetes Brother    Heart disease Brother    Diabetes Brother    Hypertension Brother    Lung cancer Brother     Social History   Tobacco Use   Smoking status: Former Smoker    Packs/day: 0.75    Years: 40.00    Pack years: 30.00    Types: Cigarettes    Quit date: 03/08/1987    Years since quitting: 33.2   Smokeless tobacco: Never Used  Vaping Use   Vaping Use: Never used  Substance Use Topics   Alcohol use: No    Alcohol/week: 0.0 standard drinks   Drug use: No    Home Medications Prior to Admission medications   Medication Sig Start Date End Date Taking? Authorizing Provider  acetaminophen (TYLENOL) 500 MG tablet Take 500 mg by mouth every 6 (six) hours as needed (pain).     [provider]  albuterol (PROVENTIL) (2.5 MG/3ML) 0.083% nebulizer solution Take 3 mLs (2.5 mg total) by nebulization in the morning and at bedtime. *May use additional one time if needed for shortness of breath 04/02/20   Irwin Brakeman L, MD  albuterol (VENTOLIN HFA) 108 (90 Base) MCG/ACT inhaler Inhale 1-2 puffs into the lungs every 6 (six) hours as needed for wheezing or shortness of breath. 04/02/20   Johnson, Clanford L, MD  allopurinol (ZYLOPRIM) 100 MG tablet Take 1 tablet (100 mg total) by mouth in the morning. 04/02/20   Johnson, Clanford L, MD  benzonatate (TESSALON) 100 MG capsule Take 1 capsule (100 mg total) by mouth 2 (two) times daily as needed for cough. 05/11/20   Fayrene Helper, MD  BREO ELLIPTA 100-25 MCG/INH AEPB 1 TAKE 1 INHALATION BY MOUTH ONCE DAILY 08/25/19   Fayrene Helper, MD  cholecalciferol (VITAMIN D3) 25 MCG (1000  UT) tablet Take 1,000 Units by  mouth in the morning.     [provider]  docusate sodium (COLACE) 100 MG capsule Take 300 mg by mouth at bedtime.     [provider]  fluticasone (FLONASE) 50 MCG/ACT nasal spray SHAKE LIQUID AND USE 2 SPRAYS IN EACH NOSTRIL DAILY 11/24/19   Fayrene Helper, MD  folic acid (FOLVITE) 1 MG tablet TAKE 1 TABLET(1 MG) BY MOUTH DAILY 11/10/19   Fayrene Helper, MD  furosemide (LASIX) 40 MG tablet Take 40 mg by mouth 2 (two) times daily. Take 80mg  BID x3DAYS THEN BACK TO 40MG  BID    [provider]  gabapentin (NEURONTIN) 300 MG capsule Take 1 capsule (300 mg total) by mouth 2 (two) times daily. 03/10/20   Fayrene Helper, MD  hydrALAZINE (APRESOLINE) 25 MG tablet Take 1 tablet (25 mg total) by mouth 2 (two) times daily. 05/19/19   Croitoru, Mihai, MD  isosorbide mononitrate (IMDUR) 30 MG 24 hr tablet Take 0.5 tablets (15 mg total) by mouth every morning. 02/04/20   Pokhrel, Corrie Mckusick, MD  Liniments (SALONPAS PAIN RELIEF PATCH EX) Apply 1 patch topically daily as needed (pain).    [provider]  meclizine (ANTIVERT) 12.5 MG tablet TAKE 1 TABLET(12.5 MG) BY MOUTH THREE TIMES DAILY AS NEEDED FOR DIZZINESS 03/01/20   Fayrene Helper, MD  metolazone (ZAROXOLYN) 2.5 MG tablet Take one tablet on Saturdays. 11/24/19   Croitoru, Mihai, MD  metoprolol succinate (TOPROL-XL) 100 MG 24 hr tablet TAKE 1 TABLET BY MOUTH EVERY DAY WITH FOOD OR MILK 05/05/20   Fayrene Helper, MD  nitroGLYCERIN (NITROSTAT) 0.4 MG SL tablet Place 1 tablet (0.4 mg total) under the tongue every 5 (five) minutes as needed for chest pain. 08/03/19   Rolland Porter, MD  ondansetron (ZOFRAN ODT) 4 MG disintegrating tablet Take 1 tablet (4 mg total) by mouth every 8 (eight) hours as needed for nausea or vomiting. 01/26/20   Fawze, Mina A, PA-C  Polyethyl [name] (SYSTANE) 0.4-0.3 % GEL ophthalmic gel Place 1 application into the left eye at bedtime.    [provider]  polyethylene glycol powder (GLYCOLAX/MIRALAX) 17 GM/SCOOP powder Take 17 g by mouth daily as needed for mild constipation or moderate constipation.    [provider]  potassium chloride SA (KLOR-CON) 20 MEQ tablet Take 1 tablet (20 mEq total) by mouth daily. TAKE 40MEQ DAILY x3 DAYS THEN BACK TO 20MEQ DAILY 04/23/20   Deberah Pelton, NP  pravastatin (PRAVACHOL) 40 MG tablet TAKE 1 TABLET BY MOUTH EVERY EVENING 11/10/19   Fayrene Helper, MD  SYNTHROID 75 MCG tablet Take 1 tablet (75 mcg total) by mouth daily before breakfast. 04/02/20   Wynetta Emery, Clanford L, MD  warfarin (JANTOVEN) 5 MG tablet Take 1-2 tablets (5-10 mg total) by mouth See admin instructions. TAKE 2 TABLETS EVERY DAY EXCEPT 1 TABLET ON TUESDAYS @ 8:30PM 04/02/20   Irwin Brakeman L, MD    Allergies    Penicillins, Aspirin, Fentanyl, and Niacin  Review of Systems   Review of Systems  All other systems reviewed and are negative.   Physical Exam Updated Vital Signs BP (!) 158/57 (BP Location: Right Arm)    Pulse (!) 59    Temp 97.8 F (36.6 C) (Oral)    Resp 18    SpO2 100%   Physical Exam Vitals and nursing note reviewed.  Constitutional:      Appearance: She is well-developed.  HENT:     Head: Normocephalic.  Nose: Nose normal.     Mouth/Throat:     Mouth: Mucous membranes are moist.  Cardiovascular:     Rate and Rhythm: Normal rate.     Comments: Tender left ribs, pain to palpation   Pulmonary:     Effort: Pulmonary effort is normal.  Abdominal:     General: There is no distension.  Musculoskeletal:        General: Normal range of motion.     Cervical back: Normal range of motion.  Skin:    General: Skin is warm.  Neurological:     General: No focal deficit present.     Mental Status: She is alert and oriented to person, place, and time.  Psychiatric:        Mood and Affect: Mood normal.     ED Results / Procedures / Treatments   Labs (all labs ordered are listed, but  only abnormal results are displayed) Labs Reviewed  CBC WITH DIFFERENTIAL/PLATELET  COMPREHENSIVE METABOLIC PANEL  URINALYSIS, ROUTINE W REFLEX MICROSCOPIC  BRAIN NATRIURETIC PEPTIDE  TROPONIN I (HIGH SENSITIVITY)    EKG None  Radiology DG Chest 2 View  Result Date: 05/27/2020 CLINICAL DATA:  Chest pain, shortness of breath EXAM: CHEST - 2 VIEW COMPARISON:  Radiograph 05/17/2020, CT 07/30/2018 FINDINGS: Prior sternotomy with evidence of prior mitral and aortic valve repairs. Stable cardiomegaly and central pulmonary arterial enlargement with a prominent and calcified aorta as well as a 4 lead biventricular pacer/AICD device with leads in unchanged position from comparison. There is central pulmonary vascular congestion and diffuse hazy interstitial opacities with septal lines. No pneumothorax. No consolidative opacity is seen. No acute osseous or soft tissue abnormality. Left greater than right shoulder arthrosis is again seen. Additional multilevel degenerative changes in the spine. No acute osseous or soft tissue abnormality. Specifically, no acute rib fracture. IMPRESSION: Cardiomegaly with central pulmonary vascular congestion and diffuse hazy interstitial opacities with septal lines, suggestive of pulmonary edema. Chronic pulmonary artery enlargement compatible with pulmonary artery hypertension. Stable prominence of the thoracic aorta with heavy calcification compatible with known thoracic aortic aneurysm. Aortic Atherosclerosis (ICD10-I70.0). Electronically Signed   By: Lovena Le M.D.   On: 05/27/2020 15:22   CT Chest Wo Contrast  Result Date: 05/27/2020 CLINICAL DATA:  [place] out of bed, left-sided chest and rib pain EXAM: CT CHEST WITHOUT CONTRAST TECHNIQUE: Multidetector CT imaging of the chest was performed following the standard protocol without IV contrast. Unenhanced CT was performed per clinician order. Lack of IV contrast limits sensitivity and specificity, especially for evaluation  of vascular structures. COMPARISON:  05/27/2020, 07/30/2018 FINDINGS: Cardiovascular: The heart is enlarged, but stable since prior study. No pericardial effusion. Multi lead pacer/AICD again noted. Aortic and mitral valve prostheses are identified. There is severe atherosclerosis of the aorta and coronary vessels. Evaluation of the vascular lumen is limited without IV contrast. Mediastinum/Nodes: No enlarged mediastinal or axillary lymph nodes. Thyroid gland, trachea, and esophagus demonstrate no significant findings. Lungs/Pleura: Upper lobe predominant emphysema is noted. There is increased interlobular septal thickening asymmetric on the right, compatible with mild interstitial edema. No acute airspace disease, effusion, or pneumothorax. The central airways are patent. Upper Abdomen: Peripherally calcified hypodensities within the spleen are nonspecific but stable since at least 2018. No acute upper abdominal findings. Musculoskeletal: There is a minimally displaced left anterolateral fourth rib fracture. No other acute bony abnormalities. Chronic midthoracic wedge compression deformity. Reconstructed images demonstrate no additional findings. IMPRESSION: 1. Minimally displaced left anterior fourth rib fracture.  2. Mild interlobular septal thickening superimposed upon background emphysema, consistent with mild interstitial edema. 3. Stable cardiomegaly. 4. Aortic Atherosclerosis (ICD10-I70.0) and Emphysema (ICD10-J43.9). Electronically Signed   By: Randa Ngo M.D.   On: 05/27/2020 20:38    Procedures Procedures (including critical care time)  Medications Ordered in ED Medications - No data to display  ED Course  I have reviewed the triage vital signs and the nursing notes.  Pertinent labs & imaging results that were available during my care of the patient were reviewed by me and considered in my medical decision making (see chart for details).    MDM Rules/Calculators/A&P                           MDM:  Pt has not taken her fluid pill today.  Pt advised to take her medication at home.  Pt counseled on rib fracture.  Tylenol for pain Final Clinical Impression(s) / ED Diagnoses Final diagnoses:  Closed fracture of one rib of left side, initial encounter    Rx / DC Orders ED Discharge Orders    None    An After Visit Summary was printed and given to the patient.   Fransico Meadow, PA-C 05/27/20 2240    Veryl Speak, MD 05/28/20 [phone]

## 2020-05-27 NOTE — Patient Outreach (Signed)
[facility]) Care Management  05/27/2020  Envy Meno [birth year] [id number]   Received in-basket [place] Link/ Epic message, patient admitted to ED on 05/27/2020.   No patient outreach at this time, RNCM will follow up to verify admission status within 1 business day, and determine follow up.   Yuka Lallier H. Annia Friendly, BSN, [place] Management [facility] Telephonic CM Phone: [phone] Fax: [phone]

## 2020-05-28 ENCOUNTER — Other Ambulatory Visit: Payer: Self-pay | Admitting: *Deleted

## 2020-05-28 NOTE — Patient Outreach (Signed)
[facility]) Care Management  05/28/2020  [name] [name] [birth year] [id number]   Subjective: Telephone call to patient's home number, no answer, left HIPAA compliant voicemail message, and requested call back.   Objective:Per KPN (Knowledge Performance Now, point of care tool) and chart review,patient had ED visit on 05/27/2020 for status post fall, left rib fracture.   Patient hospitalized 03/30/2020 - 04/02/2020 forAcute on chronic congestive heart failure,Chronic systolic congestive heart failure,Acute systolic CHF (congestive heart failure),Bilateral lower extremity edema. Patient also has a history of hypertension, aortic valve replacement, mechanical, status postMVR (mitral valve replacement),Biventricular automatic implantable cardioverter defibrillator in situ,CKD (chronic kidney disease), stage IV,Chronic anticoagulation, on coumadin for Mechanical AVR and MVR,COPD (chronic obstructive pulmonary disease),Persistent atrial fibrillation,Chronic hypoxic respiratory failure,chronic supplemental oxygen 2L/m, diabetes,Anemia of chronic disease, andHypothyroidism.      Assessment: Received [facility] Liaison screening follow up referral on 04/05/2020. Referral source: Joylene Draft. Referral reason: Please assign to telephonic RN for post hospital follow up. Patient discharged home on 04/02/20 with Home Health services, chronic oxygen [facility].Screening follow up not completed due to unable to contact patient and will proceed with case closure.       Plan:Case closure due to unable to reach.  RNCM will send MD case closure letter.      [name] [name] H. Annia Friendly, BSN, [place] Management [facility] Telephonic CM Phone: [phone] Fax: [phone]

## 2020-05-31 ENCOUNTER — Emergency Department (HOSPITAL_COMMUNITY): Payer: Medicare Other

## 2020-05-31 ENCOUNTER — Encounter (HOSPITAL_COMMUNITY): Payer: Self-pay | Admitting: Emergency Medicine

## 2020-05-31 ENCOUNTER — Emergency Department (HOSPITAL_COMMUNITY)
Admission: EM | Admit: 2020-05-31 | Discharge: 2020-05-31 | Disposition: A | Discharge status: Home / Self Care | Payer: Medicare Other | Attending: Emergency Medicine | Admitting: Emergency Medicine

## 2020-05-31 ENCOUNTER — Other Ambulatory Visit: Payer: Self-pay

## 2020-05-31 DIAGNOSIS — Z7951 Long term (current) use of inhaled steroids: Secondary | ICD-10-CM | POA: Diagnosis not present

## 2020-05-31 DIAGNOSIS — Z950 Presence of cardiac pacemaker: Secondary | ICD-10-CM | POA: Insufficient documentation

## 2020-05-31 DIAGNOSIS — Z7984 Long term (current) use of oral hypoglycemic drugs: Secondary | ICD-10-CM | POA: Diagnosis not present

## 2020-05-31 DIAGNOSIS — N184 Chronic kidney disease, stage 4 (severe): Secondary | ICD-10-CM | POA: Insufficient documentation

## 2020-05-31 DIAGNOSIS — Z7901 Long term (current) use of anticoagulants: Secondary | ICD-10-CM | POA: Diagnosis not present

## 2020-05-31 DIAGNOSIS — R0602 Shortness of breath: Secondary | ICD-10-CM | POA: Insufficient documentation

## 2020-05-31 DIAGNOSIS — I5033 Acute on chronic diastolic (congestive) heart failure: Secondary | ICD-10-CM | POA: Insufficient documentation

## 2020-05-31 DIAGNOSIS — Z87891 Personal history of nicotine dependence: Secondary | ICD-10-CM | POA: Insufficient documentation

## 2020-05-31 DIAGNOSIS — I130 Hypertensive heart and chronic kidney disease with heart failure and stage 1 through stage 4 chronic kidney disease, or unspecified chronic kidney disease: Secondary | ICD-10-CM | POA: Diagnosis not present

## 2020-05-31 DIAGNOSIS — E039 Hypothyroidism, unspecified: Secondary | ICD-10-CM | POA: Diagnosis not present

## 2020-05-31 DIAGNOSIS — Z79899 Other long term (current) drug therapy: Secondary | ICD-10-CM | POA: Diagnosis not present

## 2020-05-31 DIAGNOSIS — Z20822 Contact with and (suspected) exposure to covid-19: Secondary | ICD-10-CM | POA: Diagnosis not present

## 2020-05-31 DIAGNOSIS — J449 Chronic obstructive pulmonary disease, unspecified: Secondary | ICD-10-CM | POA: Diagnosis not present

## 2020-05-31 DIAGNOSIS — E1165 Type 2 diabetes mellitus with hyperglycemia: Secondary | ICD-10-CM | POA: Diagnosis not present

## 2020-05-31 DIAGNOSIS — E1122 Type 2 diabetes mellitus with diabetic chronic kidney disease: Secondary | ICD-10-CM | POA: Diagnosis not present

## 2020-05-31 DIAGNOSIS — I517 Cardiomegaly: Secondary | ICD-10-CM | POA: Diagnosis not present

## 2020-05-31 LAB — BASIC METABOLIC PANEL
Anion gap: 9 (ref 5–15)
BUN: 55 mg/dL — ABNORMAL HIGH (ref 8–23)
CO2: 28 mmol/L (ref 22–32)
Calcium: 9.1 mg/dL (ref 8.9–10.3)
Chloride: 97 mmol/L — ABNORMAL LOW (ref 98–111)
Creatinine, Ser: 1.5 mg/dL — ABNORMAL HIGH (ref 0.44–1.00)
GFR calc Af Amer: 36 mL/min — ABNORMAL LOW (ref >60)
GFR calc non Af Amer: 31 mL/min — ABNORMAL LOW (ref >60)
Glucose, Bld: 105 mg/dL — ABNORMAL HIGH (ref 70–99)
Potassium: 5.1 mmol/L (ref 3.5–5.1)
Sodium: 134 mmol/L — ABNORMAL LOW (ref 135–145)

## 2020-05-31 LAB — CBC
HCT: 32.2 % — ABNORMAL LOW (ref 36.0–46.0)
Hemoglobin: 9.7 g/dL — ABNORMAL LOW (ref 12.0–15.0)
MCH: 28.9 pg (ref 26.0–34.0)
MCHC: 30.1 g/dL (ref 30.0–36.0)
MCV: 95.8 fL (ref 80.0–100.0)
Platelets: 214 10*3/uL (ref 150–400)
RBC: 3.36 MIL/uL — ABNORMAL LOW (ref 3.87–5.11)
RDW: 16.7 % — ABNORMAL HIGH (ref 11.5–15.5)
WBC: 7.3 10*3/uL (ref 4.0–10.5)
nRBC: 0 % (ref 0.0–0.2)

## 2020-05-31 LAB — SARS CORONAVIRUS 2 BY RT PCR (HOSPITAL ORDER, PERFORMED IN ~~LOC~~ HOSPITAL LAB): SARS Coronavirus 2: NEGATIVE

## 2020-05-31 NOTE — ED Provider Notes (Signed)
[facility] EMERGENCY DEPARTMENT Provider Note   CSN: [id number] Arrival date & time: 05/31/20  1618     History Chief Complaint  Patient presents with  . Shortness of Breath    [name] [name] is a 86 y.o. female.  Patient seen on September 2.  Following falling out of bed.  Work-up then showed that she had a fourth left rib fracture.  Patient normally on 2 L of oxygen at home all the time.  Currently on 3 L but she is satting 100%.  Patient came in because she had some increased shortness of breath.  Denies having any real significant pain.  Patient's past medical history is also significant for acute on chronic diastolic congestive heart failure.  Patient looks very comfortable.  Patient denies any fevers.  Visit on the second they had to do a CT chest to find the rib fractures.        Past Medical History:  Diagnosis Date  . Acute on chronic diastolic CHF (congestive heart failure) ([place]) 05/19/2012  . Acute on chronic respiratory failure with hypoxia ([place]) 01/31/2020  . Adenomatous polyp of colon 12/16/2002   Dr. [name]/[facility]  . Allergy   . Calculus of gallbladder with chronic cholecystitis without obstruction 02/09/2009   Qualifier: Diagnosis of  By: [name], [name]    . Cataract   . CHB (complete heart block) ([place]) 10/07/2014      . CHF (congestive heart failure) ([place])    a.  EF previously reduced to 25-30% b. EF improved to 60-65% by echo in 10/2017.   Marland Kitchen Chronic kidney disease, stage I 2006  . Constipation       . COPD (chronic obstructive pulmonary disease) ([place])       . DJD (degenerative joint disease) of lumbar spine   . DM (diabetes mellitus) ([place]) 2006  . Esophageal reflux   . Glaucoma   . Gout       . H/O adenomatous polyp of colon 05/24/2012   2004 in [place] colonoscopy 2008 normal    . H/O heart valve replacement with mechanical valve    a. s/p mechanical AVR in 2001 and mechanical MVR in 2004.  Marland Kitchen H/O wisdom  tooth extraction   . HIP PAIN, RIGHT 04/23/2009   Qualifier: Diagnosis of  By: [name], [name]    . Hyperlipemia   . IDA (iron deficiency anemia)    Parenteral iron/[name]  . Insomnia       . Nausea without vomiting 06/07/2010   Qualifier: Diagnosis of  By: [name], [name]    . Non-ischemic cardiomyopathy ([place])    a. s/p MDT CRTD  . Obesity, unspecified   . Other and unspecified hyperlipidemia   . Oxygen deficiency 2014   nocturnal;  . Papilloma of breast 03/06/2012   This was diagnosed as a left breast papilloma by ultrasound characteristics and symptoms in 2010. Because of possible medical risk factors no surgical intervention has been done and it did doing annual followups. The area has been stable by physical examination and mammograms and ultrasound since 2010.   Marland Kitchen Spinal stenosis of lumbar region 02/19/2013  . Spondylolisthesis   . Spondylosis   . Thyroid cancer ([place])    remote thyroidectomy, no recurrence, pt denies in 2016 thyroid cancer  . Type 2 diabetes mellitus with hyperglycemia ([place]) 07/15/2010  . Unspecified hypothyroidism       . 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  Vertigo         Patient Active Problem List   Diagnosis Date Noted  . Generalized osteoarthritis 05/09/2020  . Primary osteoarthritis, left shoulder 04/21/2020  . Acute systolic CHF (congestive heart failure) ([place]) 03/31/2020  . Bilateral lower extremity edema   . Acute on chronic congestive heart failure ([place]) 03/30/2020  . Right-sided epistaxis 03/28/2020  . Acute pain of left shoulder 01/15/2020  . AAA (abdominal aortic aneurysm) without rupture ([place]) 01/14/2020  . COVID-19 virus detected 11/23/2019  . Dyspnea 05/30/2019  . Anemia 05/30/2019  . Gout   . Glaucoma   . GERD (gastroesophageal reflux disease)   . Physical debility 04/28/2019  . Hyponatremia 09/27/2018  . H/O mitral valve replacement with mechanical valve 08/02/2018  . Chronic right  hip pain 04/16/2018  . Chronic systolic congestive heart failure ([place]) 11/15/2017  . Hospital discharge follow-up 11/07/2017  . Trochanteric bursitis, right hip 03/07/2017  . Persistent atrial fibrillation ([place])   . Nocturnal hypoxia 10/08/2016  . Dependence on nocturnal oxygen therapy 10/08/2016  . Hematuria 03/20/2016  . S/P ICD (internal cardiac defibrillator) procedure 02/08/2015  . Presence of cardiac pacemaker 02/08/2015  . CHB (complete heart block) ([place]) 10/07/2014  . Fatigue 07/20/2014  . Absolute anemia 06/24/2014  . Osteopenia 02/10/2014  . Encounter for therapeutic drug monitoring 10/29/2013  . OA (osteoarthritis) of knee 02/19/2013  . Chronic pain of right knee 01/02/2013  . Hemolytic anemia ([place]) 09/24/2012  . High risk medication use 05/24/2012  . COPD (chronic obstructive pulmonary disease) ([place]) 05/19/2012  . Chronic anticoagulation, on coumadin for Mechanical AVR and MVR 04/01/2012  . Cardiorenal syndrome with renal failure 03/22/2012  . Hx of aortic valve replacement, mechanical 03/22/2012  . S/P MVR (mitral valve replacement) 03/22/2012  . Biventricular automatic implantable cardioverter defibrillator in situ 03/22/2012  . CKD (chronic kidney disease), stage IV ([place]) 03/22/2012  . Warfarin-induced coagulopathy ([place]) 03/21/2012  . Iron deficiency 10/04/2011  . Allergic rhinitis 02/14/2011  . Hypothyroidism 07/15/2010  . Mitral valve disease 07/15/2010  . Sinus node dysfunction ([place]) 07/15/2010  . VARICOSE VEINS LOWER EXTREMITIES W/OTH COMPS 02/09/2009  . Prediabetes 06/15/2008  . Hypothyroidism, postsurgical 02/24/2008  . Hyperlipidemia LDL goal <70 02/24/2008  . Essential hypertension 02/24/2008  . Non-ischemic cardiomyopathy with ICD 02/24/2008  . Spondylosis 02/24/2008    Past Surgical History:  Procedure Laterality Date  . AORTIC AND MITRAL VALVE REPLACEMENT  2001  . BI-VENTRICULAR IMPLANTABLE CARDIOVERTER DEFIBRILLATOR UPGRADE N/A 01/18/2015    Procedure: BI-VENTRICULAR IMPLANTABLE CARDIOVERTER DEFIBRILLATOR UPGRADE;  Surgeon: Evans Lance, MD;  Location: [facility] CATH LAB;  Service: Cardiovascular;  Laterality: N/A;  . BIV ICD GENERATOR CHANGEOUT N/A 01/30/2019   Procedure: BIV ICD GENERATOR CHANGEOUT;  Surgeon: Evans Lance, MD;  Location: [facility] CV LAB;  Service: Cardiovascular;  Laterality: N/A;  . CARDIAC CATHETERIZATION    . CARDIAC VALVE REPLACEMENT    . CARDIOVERSION N/A 11/13/2016   Procedure: CARDIOVERSION;  Surgeon: Sanda Klein, MD;  Location: [facility] ENDOSCOPY;  Service: Cardiovascular;  Laterality: N/A;  . COLONOSCOPY  06/12/2007   Rourk- Normal rectum/Normal colon  . COLONOSCOPY  12/16/02   small hemorrhoids  . COLONOSCOPY  06/20/2012   Procedure: COLONOSCOPY;  Surgeon: Daneil Dolin, MD;  Location: [facility] ENDO SUITE;  Service: Endoscopy;  Laterality: N/A;  10:45  . defibrillator implanted 2006    . DOPPLER ECHOCARDIOGRAPHY  2012  . DOPPLER ECHOCARDIOGRAPHY  05,06,07,08,09,2011  . ESOPHAGOGASTRODUODENOSCOPY  06/12/2007   Rourk- normal esophagus, small hiatal hernia, otherwise  normal stomach, D1, D2  . EYE SURGERY Right 2005  . EYE SURGERY Left 2006  . ICD LEAD REMOVAL Left 02/08/2015   Procedure: ICD LEAD REMOVAL;  Surgeon: Evans Lance, MD;  Location[facility] OR;  Service: Cardiovascular;  Laterality: Left;  "Will plan extraction and insertion of a BiV PM"  **Dr. Roxan Hockey backing up case**  . IMPLANTABLE CARDIOVERTER DEFIBRILLATOR (ICD) GENERATOR CHANGE Left 02/08/2015   Procedure: ICD GENERATOR CHANGE;  Surgeon: Evans Lance, MD;  Location: [place] [facility];  Service: Cardiovascular;  Laterality: Left;  . INSERT / REPLACE / REMOVE PACEMAKER    . PACEMAKER INSERTION  May 2016  . pacemaker placed  2004  . right breast cyst removed     benign   . right cataract removed     2005  . TEE WITHOUT CARDIOVERSION N/A 11/13/2016   Procedure: TRANSESOPHAGEAL ECHOCARDIOGRAM (TEE);  Surgeon: Sanda Klein, MD;  Location[facility] ENDOSCOPY;   Service: Cardiovascular;  Laterality: N/A;  . THYROIDECTOMY       OB History    Gravida      Para      Term      Preterm      AB      Living  0     SAB      TAB      Ectopic      Multiple      Live Births              Family History  Problem Relation Age of Onset  . Pancreatic cancer Mother   . Heart disease Father   . Heart disease Sister   . Heart attack Sister   . Heart disease Brother   . Diabetes Brother   . Heart disease Brother   . Diabetes Brother   . Hypertension Brother   . Lung cancer Brother     Social History   Tobacco Use  . Smoking status: Former Smoker    Packs/day: 0.75    Years: 40.00    Pack years: 30.00    Types: Cigarettes    Quit date: 03/08/1987    Years since quitting: 33.2  . Smokeless tobacco: Never Used  Vaping Use  . Vaping Use: Never used  Substance Use Topics  . Alcohol use: No    Alcohol/week: 0.0 standard drinks  . Drug use: No    Home Medications Prior to Admission medications   Medication Sig Start Date End Date Taking? Authorizing Provider  acetaminophen (TYLENOL) 500 MG tablet Take 500 mg by mouth every 6 (six) hours as needed (pain).     [provider]  albuterol (PROVENTIL) (2.5 MG/3ML) 0.083% nebulizer solution Take 3 mLs (2.5 mg total) by nebulization in the morning and at bedtime. *May use additional one time if needed for shortness of breath 04/02/20   Irwin Brakeman L, MD  albuterol (VENTOLIN HFA) 108 (90 Base) MCG/ACT inhaler Inhale 1-2 puffs into the lungs every 6 (six) hours as needed for wheezing or shortness of breath. 04/02/20   Johnson, Clanford L, MD  allopurinol (ZYLOPRIM) 100 MG tablet Take 1 tablet (100 mg total) by mouth in the morning. 04/02/20   Johnson, Clanford L, MD  benzonatate (TESSALON) 100 MG capsule Take 1 capsule (100 mg total) by mouth 2 (two) times daily as needed for cough. 05/11/20   Fayrene Helper, MD  BREO ELLIPTA 100-25 MCG/INH AEPB 1 TAKE 1 INHALATION BY MOUTH  ONCE DAILY Patient not taking: Reported on 05/27/2020  08/25/19   Fayrene Helper, MD  cholecalciferol (VITAMIN D3) 25 MCG (1000 UT) tablet Take 1,000 Units by mouth in the morning.     [provider]  docusate sodium (COLACE) 100 MG capsule Take 300 mg by mouth at bedtime.     [provider]  fluticasone (FLONASE) 50 MCG/ACT nasal spray SHAKE LIQUID AND USE 2 SPRAYS IN EACH NOSTRIL DAILY 11/24/19   Fayrene Helper, MD  folic acid (FOLVITE) 1 MG tablet TAKE 1 TABLET(1 MG) BY MOUTH DAILY 11/10/19   Fayrene Helper, MD  furosemide (LASIX) 40 MG tablet Take 40 mg by mouth daily. Take 80mg  BID x3DAYS THEN BACK TO 40MG  BID    [provider]  gabapentin (NEURONTIN) 300 MG capsule Take 1 capsule (300 mg total) by mouth 2 (two) times daily. 03/10/20   Fayrene Helper, MD  hydrALAZINE (APRESOLINE) 25 MG tablet Take 1 tablet (25 mg total) by mouth 2 (two) times daily. 05/19/19   Croitoru, Mihai, MD  isosorbide mononitrate (IMDUR) 30 MG 24 hr tablet Take 0.5 tablets (15 mg total) by mouth every morning. 02/04/20   Pokhrel, Corrie Mckusick, MD  Liniments (SALONPAS PAIN RELIEF PATCH EX) Apply 1 patch topically daily as needed (pain). Patient not taking: Reported on 05/27/2020    [provider]  meclizine (ANTIVERT) 12.5 MG tablet TAKE 1 TABLET(12.5 MG) BY MOUTH THREE TIMES DAILY AS NEEDED FOR DIZZINESS Patient taking differently: Take 12.5 mg by mouth 3 (three) times daily as needed for dizziness.  03/01/20   Fayrene Helper, MD  metolazone (ZAROXOLYN) 2.5 MG tablet Take one tablet on Saturdays. Patient taking differently: Take 2.5 mg by mouth once a week. Take one tablet on Saturdays. 11/24/19   Croitoru, Mihai, MD  metoprolol succinate (TOPROL-XL) 100 MG 24 hr tablet TAKE 1 TABLET BY MOUTH EVERY DAY WITH FOOD OR MILK Patient taking differently: Take 100 mg by mouth daily. TAKE 1 TABLET BY MOUTH EVERY DAY WITH FOOD OR MILK 05/05/20   Fayrene Helper, MD  nitroGLYCERIN  (NITROSTAT) 0.4 MG SL tablet Place 1 tablet (0.4 mg total) under the tongue every 5 (five) minutes as needed for chest pain. 08/03/19   Rolland Porter, MD  ondansetron (ZOFRAN ODT) 4 MG disintegrating tablet Take 1 tablet (4 mg total) by mouth every 8 (eight) hours as needed for nausea or vomiting. Patient not taking: Reported on 05/27/2020 01/26/20   Rodell Perna A, PA-C  Polyethyl [name] (SYSTANE) 0.4-0.3 % GEL ophthalmic gel Place 1 application into the left eye at bedtime.    [provider]  polyethylene glycol powder (GLYCOLAX/MIRALAX) 17 GM/SCOOP powder Take 17 g by mouth daily as needed for mild constipation or moderate constipation.    [provider]  potassium chloride SA (KLOR-CON) 20 MEQ tablet Take 1 tablet (20 mEq total) by mouth daily. TAKE 40MEQ DAILY x3 DAYS THEN BACK TO 20MEQ DAILY Patient taking differently: Take 20 mEq by mouth daily.  04/23/20   Deberah Pelton, NP  pravastatin (PRAVACHOL) 40 MG tablet TAKE 1 TABLET BY MOUTH EVERY EVENING Patient taking differently: Take 40 mg by mouth daily.  11/10/19   Fayrene Helper, MD  SYNTHROID 75 MCG tablet Take 1 tablet (75 mcg total) by mouth daily before breakfast. 04/02/20   Irwin Brakeman L, MD  warfarin (JANTOVEN) 5 MG tablet Take 1-2 tablets (5-10 mg total) by mouth See admin instructions. TAKE 2 TABLETS EVERY DAY EXCEPT 1 TABLET ON TUESDAYS @ 8:30PM 04/02/20  Johnson, Clanford L, MD    Allergies    Penicillins, Aspirin, Fentanyl, and Niacin  Review of Systems   Review of Systems  Constitutional: Negative for chills and fever.  HENT: Negative for congestion, rhinorrhea and sore throat.   Eyes: Negative for visual disturbance.  Respiratory: Positive for shortness of breath. Negative for cough.   Cardiovascular: Negative for chest pain and leg swelling.  Gastrointestinal: Negative for abdominal pain, diarrhea, nausea and vomiting.  Genitourinary: Negative for dysuria.  Musculoskeletal: Negative for  back pain and neck pain.  Skin: Negative for rash.  Neurological: Negative for dizziness, light-headedness and headaches.  Hematological: Does not bruise/bleed easily.  Psychiatric/Behavioral: Negative for confusion.    Physical Exam Updated Vital Signs BP (!) 175/80   Pulse (!) 59   Temp 98.8 F (37.1 C) (Oral)   Resp 20   Ht 1.702 m (5\' 7" )   Wt 88.5 kg   SpO2 100%   BMI 30.54 kg/m   Physical Exam Vitals and nursing note reviewed.  Constitutional:      General: She is not in acute distress.    Appearance: Normal appearance. She is well-developed.  HENT:     Head: Normocephalic and atraumatic.  Eyes:     Extraocular Movements: Extraocular movements intact.     Conjunctiva/sclera: Conjunctivae normal.     Pupils: Pupils are equal, round, and reactive to light.  Cardiovascular:     Rate and Rhythm: Normal rate and regular rhythm.     Heart sounds: No murmur heard.   Pulmonary:     Effort: Pulmonary effort is normal. No respiratory distress.     Breath sounds: Normal breath sounds. No wheezing or rales.  Abdominal:     Palpations: Abdomen is soft.     Tenderness: There is no abdominal tenderness.  Musculoskeletal:        General: Normal range of motion.     Cervical back: Normal range of motion and neck supple.  Skin:    General: Skin is warm and dry.  Neurological:     General: No focal deficit present.     Mental Status: She is alert and oriented to person, place, and time.     Cranial Nerves: No cranial nerve deficit.     Sensory: No sensory deficit.     Motor: No weakness.     ED Results / Procedures / Treatments   Labs (all labs ordered are listed, but only abnormal results are displayed) Labs Reviewed  CBC - Abnormal; Notable for the following components:      Result Value   RBC 3.36 (*)    Hemoglobin 9.7 (*)    HCT 32.2 (*)    RDW 16.7 (*)    All other components within normal limits  BASIC METABOLIC PANEL - Abnormal; Notable for the following  components:   Sodium 134 (*)    Chloride 97 (*)    Glucose, Bld 105 (*)    BUN 55 (*)    Creatinine, Ser 1.50 (*)    GFR calc non Af Amer 31 (*)    GFR calc Af Amer 36 (*)    All other components within normal limits  SARS CORONAVIRUS 2 BY RT PCR [facility] ORDER, [place] LAB)    EKG EKG Interpretation  Date/Time:  Monday May 31 2020 17:39:37 EDT Ventricular Rate:  64 PR Interval:    QRS Duration: 182 QT Interval:  496 QTC Calculation: 511 R Axis:   144 Text  Interpretation: Ventricular-paced rhythm Abnormal ECG Confirmed by Fredia Sorrow [id number]) on 05/31/2020 9:49:09 PM   Radiology DG Chest Portable 1 View  Result Date: 05/31/2020 CLINICAL DATA:  Shortness of breath EXAM: PORTABLE CHEST 1 VIEW COMPARISON:  05/27/2020 FINDINGS: Left AICD remains in place, unchanged. Prior valve replacement. Aortic atherosclerosis. Cardiomegaly with vascular congestion. Interstitial prominence throughout the lungs may reflect interstitial edema. No effusions or acute bony abnormality. IMPRESSION: Cardiomegaly with vascular congestion and mild interstitial edema. Electronically Signed   By: Rolm Baptise M.D.   On: 05/31/2020 22:16    Procedures Procedures (including critical care time)  Medications Ordered in ED Medications - No data to display  ED Course  I have reviewed the triage vital signs and the nursing notes.  Pertinent labs & imaging results that were available during my care of the patient were reviewed by me and considered in my medical decision making (see chart for details).    MDM Rules/Calculators/A&P                          Patient with known left fourth rib fracture.  But pretty comfortable.  Seems to be breathing fine.  On 2 or 3 L of oxygen patient satting above 95%.  Patient is on 2 L of oxygen at home all the time.  No leukocytosis.  Hemoglobin 9.7.  No significant change.  Covid testing was negative.  Chest x-ray raise some concerns of  some interstitial edema.  Patient is already on Lasix 40 mg twice a day.  Patient without significant tachypnea or respiratory problems and certainly no hypoxia.  Patient very comfortable on 3 L of oxygen which she can easily do at home.  Probably would do fine on 2.  Patient was just worried because of the history of the rib fractures to make sure that she did not have a collapsed lung or was not developing pneumonia.  Patient can follow-up with her primary care doctor Dr. [name].   Final Clinical Impression(s) / ED Diagnoses Final diagnoses:  SOB (shortness of breath)    Rx / DC Orders ED Discharge Orders    None       Fredia Sorrow, MD 05/31/20 2342

## 2020-05-31 NOTE — ED Triage Notes (Signed)
Pt c/o SOB since last night; reports a cracked rib, hx of COPD and CHF; pt is on 2L O2 at home; triage BP 168/72; triage O2 sat 82%; placed on 4L [place] increased to 96%

## 2020-05-31 NOTE — Discharge Instructions (Addendum)
Work-up today showed no significant changes on the chest x-ray regarding the rib fracture no collapsed lung no pneumonia.  May have shown a little bit of evidence of some pulmonary edema but your oxygen levels on your 2 L of oxygen are very good.  Return for any new or worse symptoms.  Make an appointment to follow-up with your primary care doctor.  Continue take your Lasix as directed 40 mg twice a day.

## 2020-06-02 ENCOUNTER — Telehealth: Payer: Self-pay | Admitting: Family Medicine

## 2020-06-02 ENCOUNTER — Other Ambulatory Visit: Payer: Self-pay | Admitting: *Deleted

## 2020-06-02 NOTE — Telephone Encounter (Signed)
Niece aware orders have been faxed to [employer]

## 2020-06-02 NOTE — Telephone Encounter (Signed)
Patients niece dollree lane called in about a order for a wheelchair that was supposed to be put in they havent heard anything or received any calls in regards to this phone number is [phone]

## 2020-06-02 NOTE — Patient Outreach (Signed)
[facility]) Care Management  06/02/2020  [name] [name] 08/12/1932 [id number]   Received voicemail message from [facility], states she is returning call, and requested call back.  Telephone call to patient's home number, spoke with patient, and HIPAA verified.  Discussed [facility]ia[facility] referral follow up, patient voiced understanding, and declines program enrollment at this time.   States she is has a lot going on, recently fell, will contact this RNCM and/ or [facility] Care Management if assistance needed in the future.    States she is very appreciative of the follow up.  Case will remain closed.    [name] [name] H. Annia Friendly, BSN, [facility] Telephonic CM Phone: [phone] Fax: [phone]

## 2020-06-03 ENCOUNTER — Telehealth: Payer: Self-pay | Admitting: Cardiovascular Disease

## 2020-06-03 NOTE — Telephone Encounter (Signed)
Patient states that she fell last Friday and fractured her rib. She was told to get in touch with her cardiologist. She is calling to speak with Dr. Victorino December nurse. Please advise.

## 2020-06-03 NOTE — Telephone Encounter (Signed)
Ms. Wachs reports she got her foot caught in the sheets on Friday, fell and fractured her rib.  She went to [facility] and was evaluated (note in Epic- she fractured left 4th rib). She was instructed to notify Cardiology.  Confirmed with the patient she was not dizzy or faint when she fell - it was strictly mechanical.  The patient is on Warfarin. Confirmed with her she did not hit her head. She has no lacerations or bruising. She denies SOB and CP. She was just seen in the office with Dr. Sallyanne Kuster 8/31.  Instructed her to follow ED instructions and rest and ice her rib.  She understands she will be called if Dr. Sallyanne Kuster has further recommendations.

## 2020-06-04 NOTE — Telephone Encounter (Signed)
Thanks. I would just warn her that fractured ribs can take several weeks to heal..Marland KitchenMarland Kitchen

## 2020-06-09 ENCOUNTER — Ambulatory Visit: Payer: Medicare Other | Admitting: Family Medicine

## 2020-06-09 IMAGING — DX DG CHEST 1V PORT
1 series · 1 of 1 positions shown · non-contrast
Comparison: 05/29/2019

CLINICAL DATA: Chest pain

EXAM:
PORTABLE CHEST 1 VIEW

[chest ap]
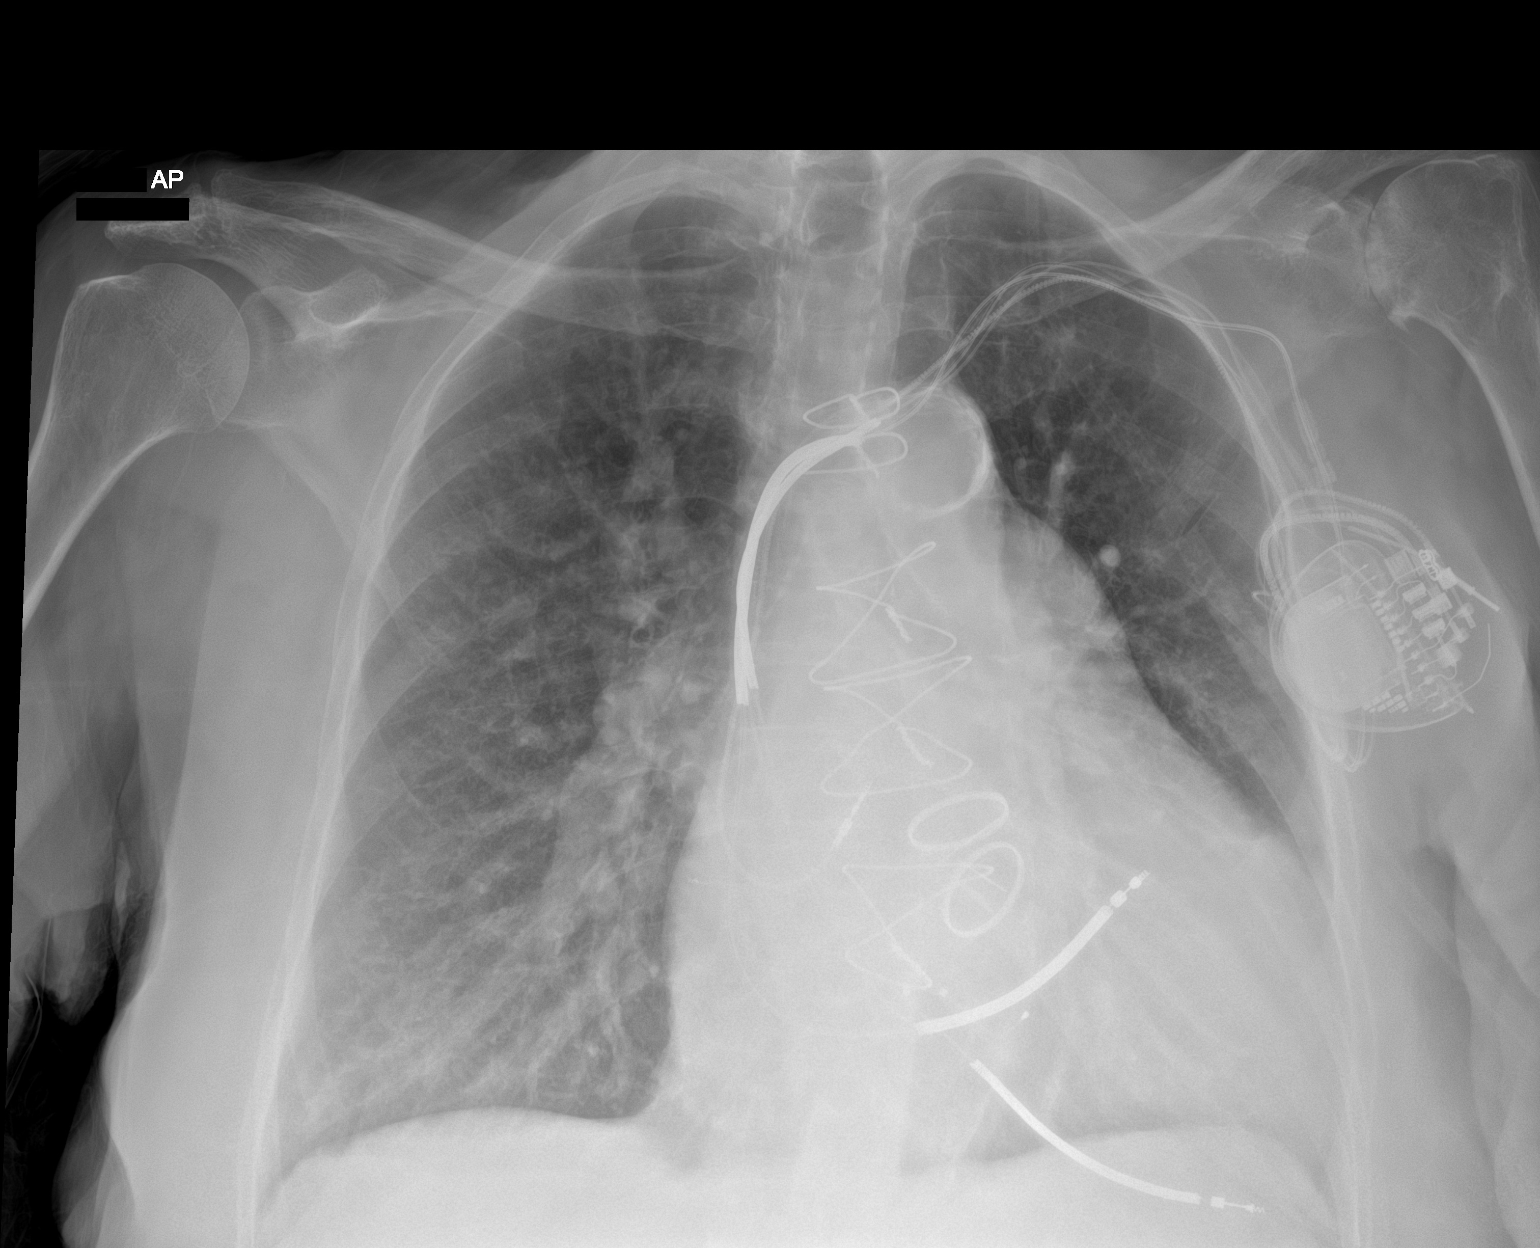

[1 of 1 positions shown; findings below may reference images not displayed]

FINDINGS: The heart size is enlarged. There is a dual chamber left-sided
pacemaker in place. The lungs are hyperexpanded. There is no
pneumothorax. No large pleural effusion. Vascular congestion is
noted. There are end-stage degenerative changes of the left
glenohumeral joint.
IMPRESSION: 1. Cardiomegaly with vascular congestion.
2. Hyperexpanded lungs which can be seen in patients with COPD.

## 2020-06-11 ENCOUNTER — Inpatient Hospital Stay (HOSPITAL_COMMUNITY): Payer: Medicare Other | Attending: Hematology

## 2020-06-11 ENCOUNTER — Other Ambulatory Visit: Payer: Self-pay

## 2020-06-11 ENCOUNTER — Inpatient Hospital Stay (HOSPITAL_COMMUNITY): Payer: Medicare Other

## 2020-06-11 ENCOUNTER — Ambulatory Visit: Payer: Self-pay | Admitting: *Deleted

## 2020-06-11 VITALS — BP 133/49 | HR 59 | Temp 97.0°F | Resp 18

## 2020-06-11 DIAGNOSIS — N181 Chronic kidney disease, stage 1: Secondary | ICD-10-CM | POA: Insufficient documentation

## 2020-06-11 DIAGNOSIS — D631 Anemia in chronic kidney disease: Secondary | ICD-10-CM | POA: Insufficient documentation

## 2020-06-11 DIAGNOSIS — N184 Chronic kidney disease, stage 4 (severe): Secondary | ICD-10-CM

## 2020-06-11 DIAGNOSIS — D649 Anemia, unspecified: Secondary | ICD-10-CM

## 2020-06-11 DIAGNOSIS — E611 Iron deficiency: Secondary | ICD-10-CM

## 2020-06-11 LAB — CBC
HCT: 29 % — ABNORMAL LOW (ref 36.0–46.0)
Hemoglobin: 8.7 g/dL — ABNORMAL LOW (ref 12.0–15.0)
MCH: 28.9 pg (ref 26.0–34.0)
MCHC: 30 g/dL (ref 30.0–36.0)
MCV: 96.3 fL (ref 80.0–100.0)
Platelets: 181 10*3/uL (ref 150–400)
RBC: 3.01 MIL/uL — ABNORMAL LOW (ref 3.87–5.11)
RDW: 16.5 % — ABNORMAL HIGH (ref 11.5–15.5)
WBC: 5.7 10*3/uL (ref 4.0–10.5)
nRBC: 0 % (ref 0.0–0.2)

## 2020-06-11 MED ORDER — EPOETIN ALFA-EPBX 40000 UNIT/ML IJ SOLN
40000.0000 [IU] | Freq: Once | INTRAMUSCULAR | Status: AC
  Administered 2020-06-11: 40000 [IU] via SUBCUTANEOUS
  Filled 2020-06-11: qty 1

## 2020-06-11 NOTE — Progress Notes (Signed)
Patient tolerated Retacrit injection with no complaints voiced.  Site clean and dry with no bruising or swelling noted.  No complaints of pain.  Discharged with vital signs stable and no signs or symptoms of distress noted.  

## 2020-06-12 ENCOUNTER — Other Ambulatory Visit: Payer: Self-pay | Admitting: Family Medicine

## 2020-06-14 ENCOUNTER — Ambulatory Visit (INDEPENDENT_AMBULATORY_CARE_PROVIDER_SITE_OTHER): Payer: Medicare Other | Admitting: *Deleted

## 2020-06-14 DIAGNOSIS — D631 Anemia in chronic kidney disease: Secondary | ICD-10-CM | POA: Diagnosis not present

## 2020-06-14 DIAGNOSIS — I129 Hypertensive chronic kidney disease with stage 1 through stage 4 chronic kidney disease, or unspecified chronic kidney disease: Secondary | ICD-10-CM | POA: Diagnosis not present

## 2020-06-14 DIAGNOSIS — R809 Proteinuria, unspecified: Secondary | ICD-10-CM | POA: Diagnosis not present

## 2020-06-14 DIAGNOSIS — Z5181 Encounter for therapeutic drug level monitoring: Secondary | ICD-10-CM | POA: Diagnosis not present

## 2020-06-14 DIAGNOSIS — I5032 Chronic diastolic (congestive) heart failure: Secondary | ICD-10-CM | POA: Diagnosis not present

## 2020-06-14 DIAGNOSIS — Z952 Presence of prosthetic heart valve: Secondary | ICD-10-CM | POA: Diagnosis not present

## 2020-06-14 DIAGNOSIS — N1832 Chronic kidney disease, stage 3b: Secondary | ICD-10-CM | POA: Diagnosis not present

## 2020-06-14 DIAGNOSIS — N189 Chronic kidney disease, unspecified: Secondary | ICD-10-CM | POA: Diagnosis not present

## 2020-06-14 LAB — POCT INR: INR: 3.7 — AB (ref 2.0–3.0)

## 2020-06-14 NOTE — Patient Instructions (Signed)
Hold warfarin tonight, take 1 tablet tomorrow night then resume 2 tablets daily  Continue greens Recheck in 3 weeks

## 2020-06-16 ENCOUNTER — Other Ambulatory Visit: Payer: Self-pay

## 2020-06-16 ENCOUNTER — Ambulatory Visit (INDEPENDENT_AMBULATORY_CARE_PROVIDER_SITE_OTHER): Payer: Medicare Other | Admitting: Family Medicine

## 2020-06-16 ENCOUNTER — Encounter: Payer: Self-pay | Admitting: Family Medicine

## 2020-06-16 VITALS — BP 138/80 | HR 93 | Resp 15 | Wt 205.0 lb

## 2020-06-16 DIAGNOSIS — M7989 Other specified soft tissue disorders: Secondary | ICD-10-CM | POA: Diagnosis not present

## 2020-06-16 DIAGNOSIS — M1711 Unilateral primary osteoarthritis, right knee: Secondary | ICD-10-CM | POA: Diagnosis not present

## 2020-06-16 DIAGNOSIS — Z09 Encounter for follow-up examination after completed treatment for conditions other than malignant neoplasm: Secondary | ICD-10-CM

## 2020-06-16 DIAGNOSIS — Z23 Encounter for immunization: Secondary | ICD-10-CM

## 2020-06-16 NOTE — Assessment & Plan Note (Signed)
Right knee pain and swelling x 5 days ortho eval

## 2020-06-16 NOTE — Patient Instructions (Signed)
F/u in October as before, call if you need me sooner  Flu vaccine today'   You are referred urgently to Dr Luna Glasgow Aline Brochure re swollen right knee, please try anfd get appointment for patient before she leaves today  For the fluid that you are retaining with leg swelling, take furosemide 40 mg one tablet TWO times daily for 3 days , starting today, and ending on Friday, also take zaroxolyn , the tablet that you take once weekly on a Saturday , take one today please, and the next one on Saturday as usual

## 2020-06-20 ENCOUNTER — Encounter: Payer: Self-pay | Admitting: Family Medicine

## 2020-06-20 DIAGNOSIS — M1711 Unilateral primary osteoarthritis, right knee: Secondary | ICD-10-CM | POA: Insufficient documentation

## 2020-06-20 NOTE — Assessment & Plan Note (Signed)
Acute flare of swellling and deformity, refer to Ortho asap

## 2020-06-20 NOTE — Assessment & Plan Note (Signed)
Presented on 09/06with shortness of breath, still has significant bipedal edema, and I have instructed her to double her furosemide for 3 days and add zaroxolyn for a few days also Ed visit reviewed

## 2020-06-20 NOTE — Progress Notes (Signed)
   [name] [name]     MRN: [id number]      DOB: 01/25/1932   HPI [name] [name] is here for follow up of recent ED visit on 09/06 when he presented with SOB when she presented with acute right knee swelling with increased stiffness and reduced ROM. She has had no falls. C/o bilateral leg swelling, denies new orthopnea or PND, denies chest pain   ROS Denies recent fever or chills. Denies sinus pressure, nasal congestion, ear pain or sore throat. Denies chest congestion, productive cough or wheezing. Denies chest pains, palpitations Denies abdominal pain, nausea, vomiting,diarrhea or constipation.   Denies dysuria, frequency, hesitancy Denies headaches, seizures,. Denies depression, anxiety or insomnia. Denies skin break down or rash.   PE  BP 138/80   Pulse 93   Resp 15   Wt 205 lb (93 kg)   SpO2 94% Comment: 2 liters o2  BMI 32.11 kg/m   Patient alert and oriented and in no cardiopulmonary distress.  HEENT: No facial asymmetry, EOMI,     Neck decreased ROM, no adenopathy.  Chest: Clear to auscultation bilaterally.  CVS: S1, S2 systolic  Murmur and click,also diastolic murmur, no S3.Regular rate.  ABD: Soft non tender.   Ext: bilateral 2 plus edema  MS: decreased  ROM spine, shoulders, hips and right knee , which is swollen and has crepitus  Skin: Intact, no ulcerations or rash noted.  Psych: Good eye contact, normal affect. Memory intact not anxious or depressed appearing.  CNS: CN 2-12 intact, power,  normal throughout.no focal deficits noted.   Assessment & Plan  OA (osteoarthritis) of knee Right knee pain and swelling x 5 days ortho eval  Encounter for examination following treatment at hospital Presented on 09/06with shortness of breath, still has significant bipedal edema, and I have instructed her to double her furosemide for 3 days and add zaroxolyn for a few days also Ed visit reviewed  Arthritis of right knee Acute flare of swellling and deformity,  refer to Ortho asap  Leg swelling Double lasix s x 3 to 5 days and add zaroxolyn x 3 days

## 2020-06-20 NOTE — Assessment & Plan Note (Signed)
Double lasix s x 3 to 5 days and add zaroxolyn x 3 days

## 2020-06-22 ENCOUNTER — Ambulatory Visit: Payer: Medicare Other | Admitting: Orthopaedic Surgery

## 2020-06-22 ENCOUNTER — Other Ambulatory Visit: Payer: Self-pay

## 2020-06-22 MED ORDER — HYDRALAZINE HCL 25 MG PO TABS
25.0000 mg | ORAL_TABLET | Freq: Two times a day (BID) | ORAL | 3 refills | Status: DC
Start: 2020-06-22 — End: 2020-07-08

## 2020-06-24 ENCOUNTER — Ambulatory Visit: Payer: Medicare Other | Admitting: Family Medicine

## 2020-06-24 DIAGNOSIS — D631 Anemia in chronic kidney disease: Secondary | ICD-10-CM | POA: Diagnosis not present

## 2020-06-24 DIAGNOSIS — I5043 Acute on chronic combined systolic (congestive) and diastolic (congestive) heart failure: Secondary | ICD-10-CM | POA: Diagnosis not present

## 2020-06-24 DIAGNOSIS — N1832 Chronic kidney disease, stage 3b: Secondary | ICD-10-CM | POA: Diagnosis not present

## 2020-06-24 DIAGNOSIS — N189 Chronic kidney disease, unspecified: Secondary | ICD-10-CM | POA: Diagnosis not present

## 2020-06-24 DIAGNOSIS — R0902 Hypoxemia: Secondary | ICD-10-CM | POA: Diagnosis not present

## 2020-06-24 DIAGNOSIS — R809 Proteinuria, unspecified: Secondary | ICD-10-CM | POA: Diagnosis not present

## 2020-06-24 DIAGNOSIS — I129 Hypertensive chronic kidney disease with stage 1 through stage 4 chronic kidney disease, or unspecified chronic kidney disease: Secondary | ICD-10-CM | POA: Diagnosis not present

## 2020-06-25 ENCOUNTER — Other Ambulatory Visit (HOSPITAL_COMMUNITY): Payer: Self-pay | Admitting: Nephrology

## 2020-06-25 ENCOUNTER — Other Ambulatory Visit: Payer: Self-pay

## 2020-06-25 ENCOUNTER — Ambulatory Visit (HOSPITAL_COMMUNITY)
Admission: RE | Admit: 2020-06-25 | Discharge: 2020-06-25 | Disposition: A | Discharge status: Home / Self Care | Payer: Medicare Other | Source: Ambulatory Visit | Attending: Nephrology | Admitting: Nephrology

## 2020-06-25 DIAGNOSIS — N1832 Chronic kidney disease, stage 3b: Secondary | ICD-10-CM

## 2020-06-25 DIAGNOSIS — L851 Acquired keratosis [keratoderma] palmaris et plantaris: Secondary | ICD-10-CM | POA: Diagnosis not present

## 2020-06-25 DIAGNOSIS — N189 Chronic kidney disease, unspecified: Secondary | ICD-10-CM | POA: Diagnosis not present

## 2020-06-25 DIAGNOSIS — E1142 Type 2 diabetes mellitus with diabetic polyneuropathy: Secondary | ICD-10-CM | POA: Diagnosis not present

## 2020-06-25 DIAGNOSIS — L603 Nail dystrophy: Secondary | ICD-10-CM | POA: Diagnosis not present

## 2020-06-28 ENCOUNTER — Ambulatory Visit (INDEPENDENT_AMBULATORY_CARE_PROVIDER_SITE_OTHER): Payer: Medicare Other

## 2020-06-28 DIAGNOSIS — I5042 Chronic combined systolic (congestive) and diastolic (congestive) heart failure: Secondary | ICD-10-CM | POA: Diagnosis not present

## 2020-06-28 DIAGNOSIS — Z95810 Presence of automatic (implantable) cardiac defibrillator: Secondary | ICD-10-CM | POA: Diagnosis not present

## 2020-06-28 DIAGNOSIS — Z950 Presence of cardiac pacemaker: Secondary | ICD-10-CM

## 2020-06-28 NOTE — Progress Notes (Signed)
EPIC Encounter for ICM Monitoring  Patient Name: [name] [name] is a 86 y.o. female Date: 06/28/2020 Primary Care Physican: Fayrene Helper, MD Primary Cardiologist:Croitoru Electrophysiologist:Taylor Bi-V Pacing:99.5% 6/23/2021Weight:185.8lbs 06/28/2020 Weight: 191 lbs  1 Monitored VT episode on 06/20/2020 for duration of :05 sec    Spoke with patient and reports feeling well at this time.  Denies fluid symptoms.   2 September ED visits and 1 in August.  She does eat restaurant foods and advised to avoid those foods since they are typically high in salt.     Optivol thoracic impedancesuggesting possible fluid accumulation since 05/23/2020.   She is not taking Metolazone weekly due to Furosemide and Metolazone are interfering with her sleep.    Prescribed:  Furosemide40 mg1tablet (40 mg total) by mouth twice a day.   Metolazaone 2.5 mg Take 1tabletoncea weekonSaturdays.   Potassium 20 mEq take 1 tablet daily.  Labs: 05/31/2020 Creatinine 1.50, BUN 55, Potassium 5.1, Sodium 134, GFR 31-36 05/27/2020 Creatinine 1.78, BUN 58, Potassium 4.3, Sodium 136, GFR 25-29  05/17/2020 Creatinine 1.51, BUN 41, Potassium 4.2, Sodium 129, GFR 31-36 04/14/2020 Creatinine1.51, BUN57, Potassium4.6, Sodium135, GFR31-36 04/02/2020 Creatinine1.40, BUN42, Potassium4.3, Sodium131, ZOX09-60  A complete set of results can be found in Results Review.  Recommendations Advised to take Metolazone weekly as prescribed and limit salt intake to 2000 mg daily.   Follow-up plan: ICM clinic phone appointment on10/07/2020 to recheck fluid levels.91 day device clinic remote transmission11/19/2021.  EP/Cardiology Office Visits:  Recall 05/15/2020 with Dr Lovena Le.  Recall 11/21/2020 with Dr Sallyanne Kuster.   Copy of ICM check sent to Dr.Taylor and Dr Sallyanne Kuster for review.   3 month ICM trend: 06/28/2020    1 Year ICM trend:       [name], RN 06/28/2020 4:19  PM

## 2020-06-29 ENCOUNTER — Emergency Department (HOSPITAL_COMMUNITY): Payer: Medicare Other

## 2020-06-29 ENCOUNTER — Encounter (HOSPITAL_COMMUNITY): Payer: Self-pay | Admitting: Emergency Medicine

## 2020-06-29 ENCOUNTER — Inpatient Hospital Stay (HOSPITAL_COMMUNITY)
Admission: EM | Admit: 2020-06-29 | Discharge: 2020-07-08 | DRG: 291 | Disposition: A | Discharge status: Skilled Nursing Facility | Payer: Medicare Other | Attending: Internal Medicine | Admitting: Internal Medicine

## 2020-06-29 ENCOUNTER — Encounter: Payer: Self-pay | Admitting: Orthopaedic Surgery

## 2020-06-29 ENCOUNTER — Ambulatory Visit (INDEPENDENT_AMBULATORY_CARE_PROVIDER_SITE_OTHER): Payer: Medicare Other | Admitting: Orthopaedic Surgery

## 2020-06-29 ENCOUNTER — Other Ambulatory Visit: Payer: Self-pay

## 2020-06-29 VITALS — BP 162/79 | HR 63 | Ht 67.0 in | Wt 205.0 lb

## 2020-06-29 DIAGNOSIS — K5900 Constipation, unspecified: Secondary | ICD-10-CM | POA: Diagnosis present

## 2020-06-29 DIAGNOSIS — Z952 Presence of prosthetic heart valve: Secondary | ICD-10-CM

## 2020-06-29 DIAGNOSIS — J9611 Chronic respiratory failure with hypoxia: Secondary | ICD-10-CM | POA: Diagnosis present

## 2020-06-29 DIAGNOSIS — I248 Other forms of acute ischemic heart disease: Secondary | ICD-10-CM | POA: Diagnosis present

## 2020-06-29 DIAGNOSIS — M25512 Pain in left shoulder: Secondary | ICD-10-CM

## 2020-06-29 DIAGNOSIS — E7849 Other hyperlipidemia: Secondary | ICD-10-CM | POA: Diagnosis present

## 2020-06-29 DIAGNOSIS — Z87891 Personal history of nicotine dependence: Secondary | ICD-10-CM

## 2020-06-29 DIAGNOSIS — J449 Chronic obstructive pulmonary disease, unspecified: Secondary | ICD-10-CM | POA: Diagnosis present

## 2020-06-29 DIAGNOSIS — E785 Hyperlipidemia, unspecified: Secondary | ICD-10-CM | POA: Diagnosis present

## 2020-06-29 DIAGNOSIS — R569 Unspecified convulsions: Secondary | ICD-10-CM

## 2020-06-29 DIAGNOSIS — I950 Idiopathic hypotension: Secondary | ICD-10-CM

## 2020-06-29 DIAGNOSIS — K219 Gastro-esophageal reflux disease without esophagitis: Secondary | ICD-10-CM | POA: Diagnosis present

## 2020-06-29 DIAGNOSIS — Z800 Family history of malignant neoplasm of digestive organs: Secondary | ICD-10-CM

## 2020-06-29 DIAGNOSIS — Z20822 Contact with and (suspected) exposure to covid-19: Secondary | ICD-10-CM | POA: Diagnosis not present

## 2020-06-29 DIAGNOSIS — H02846 Edema of left eye, unspecified eyelid: Secondary | ICD-10-CM | POA: Diagnosis present

## 2020-06-29 DIAGNOSIS — Z95810 Presence of automatic (implantable) cardiac defibrillator: Secondary | ICD-10-CM

## 2020-06-29 DIAGNOSIS — J441 Chronic obstructive pulmonary disease with (acute) exacerbation: Secondary | ICD-10-CM

## 2020-06-29 DIAGNOSIS — I5043 Acute on chronic combined systolic (congestive) and diastolic (congestive) heart failure: Secondary | ICD-10-CM | POA: Diagnosis present

## 2020-06-29 DIAGNOSIS — Z9841 Cataract extraction status, right eye: Secondary | ICD-10-CM

## 2020-06-29 DIAGNOSIS — Z880 Allergy status to penicillin: Secondary | ICD-10-CM

## 2020-06-29 DIAGNOSIS — M25561 Pain in right knee: Secondary | ICD-10-CM

## 2020-06-29 DIAGNOSIS — J811 Chronic pulmonary edema: Secondary | ICD-10-CM | POA: Diagnosis not present

## 2020-06-29 DIAGNOSIS — E1142 Type 2 diabetes mellitus with diabetic polyneuropathy: Secondary | ICD-10-CM | POA: Diagnosis present

## 2020-06-29 DIAGNOSIS — G8929 Other chronic pain: Secondary | ICD-10-CM | POA: Diagnosis not present

## 2020-06-29 DIAGNOSIS — Z515 Encounter for palliative care: Secondary | ICD-10-CM

## 2020-06-29 DIAGNOSIS — J9620 Acute and chronic respiratory failure, unspecified whether with hypoxia or hypercapnia: Secondary | ICD-10-CM

## 2020-06-29 DIAGNOSIS — E11649 Type 2 diabetes mellitus with hypoglycemia without coma: Secondary | ICD-10-CM

## 2020-06-29 DIAGNOSIS — Z79890 Hormone replacement therapy (postmenopausal): Secondary | ICD-10-CM

## 2020-06-29 DIAGNOSIS — Z66 Do not resuscitate: Secondary | ICD-10-CM | POA: Diagnosis present

## 2020-06-29 DIAGNOSIS — I130 Hypertensive heart and chronic kidney disease with heart failure and stage 1 through stage 4 chronic kidney disease, or unspecified chronic kidney disease: Secondary | ICD-10-CM | POA: Diagnosis not present

## 2020-06-29 DIAGNOSIS — Z7189 Other specified counseling: Secondary | ICD-10-CM

## 2020-06-29 DIAGNOSIS — R110 Nausea: Secondary | ICD-10-CM

## 2020-06-29 DIAGNOSIS — I509 Heart failure, unspecified: Secondary | ICD-10-CM

## 2020-06-29 DIAGNOSIS — I4819 Other persistent atrial fibrillation: Secondary | ICD-10-CM | POA: Diagnosis present

## 2020-06-29 DIAGNOSIS — Z452 Encounter for adjustment and management of vascular access device: Secondary | ICD-10-CM

## 2020-06-29 DIAGNOSIS — E1122 Type 2 diabetes mellitus with diabetic chronic kidney disease: Secondary | ICD-10-CM | POA: Diagnosis present

## 2020-06-29 DIAGNOSIS — Z886 Allergy status to analgesic agent status: Secondary | ICD-10-CM

## 2020-06-29 DIAGNOSIS — I442 Atrioventricular block, complete: Secondary | ICD-10-CM | POA: Diagnosis present

## 2020-06-29 DIAGNOSIS — G9340 Encephalopathy, unspecified: Secondary | ICD-10-CM | POA: Diagnosis not present

## 2020-06-29 DIAGNOSIS — I1310 Hypertensive heart and chronic kidney disease without heart failure, with stage 1 through stage 4 chronic kidney disease, or unspecified chronic kidney disease: Secondary | ICD-10-CM | POA: Diagnosis present

## 2020-06-29 DIAGNOSIS — Z9981 Dependence on supplemental oxygen: Secondary | ICD-10-CM

## 2020-06-29 DIAGNOSIS — I110 Hypertensive heart disease with heart failure: Secondary | ICD-10-CM | POA: Diagnosis not present

## 2020-06-29 DIAGNOSIS — R0602 Shortness of breath: Secondary | ICD-10-CM

## 2020-06-29 DIAGNOSIS — Z7951 Long term (current) use of inhaled steroids: Secondary | ICD-10-CM

## 2020-06-29 DIAGNOSIS — Z833 Family history of diabetes mellitus: Secondary | ICD-10-CM

## 2020-06-29 DIAGNOSIS — N184 Chronic kidney disease, stage 4 (severe): Secondary | ICD-10-CM | POA: Diagnosis present

## 2020-06-29 DIAGNOSIS — E8779 Other fluid overload: Secondary | ICD-10-CM

## 2020-06-29 DIAGNOSIS — Z801 Family history of malignant neoplasm of trachea, bronchus and lung: Secondary | ICD-10-CM

## 2020-06-29 DIAGNOSIS — Z86010 Personal history of colonic polyps: Secondary | ICD-10-CM

## 2020-06-29 DIAGNOSIS — I4892 Unspecified atrial flutter: Secondary | ICD-10-CM | POA: Diagnosis present

## 2020-06-29 DIAGNOSIS — E039 Hypothyroidism, unspecified: Secondary | ICD-10-CM | POA: Diagnosis present

## 2020-06-29 DIAGNOSIS — E872 Acidosis: Secondary | ICD-10-CM | POA: Diagnosis not present

## 2020-06-29 DIAGNOSIS — Z7901 Long term (current) use of anticoagulants: Secondary | ICD-10-CM

## 2020-06-29 DIAGNOSIS — Z885 Allergy status to narcotic agent status: Secondary | ICD-10-CM

## 2020-06-29 DIAGNOSIS — Z8249 Family history of ischemic heart disease and other diseases of the circulatory system: Secondary | ICD-10-CM

## 2020-06-29 DIAGNOSIS — I428 Other cardiomyopathies: Secondary | ICD-10-CM | POA: Diagnosis present

## 2020-06-29 DIAGNOSIS — Z79899 Other long term (current) drug therapy: Secondary | ICD-10-CM

## 2020-06-29 DIAGNOSIS — R109 Unspecified abdominal pain: Secondary | ICD-10-CM

## 2020-06-29 LAB — COMPREHENSIVE METABOLIC PANEL
ALT: 18 U/L (ref 0–44)
AST: 34 U/L (ref 15–41)
Albumin: 3.8 g/dL (ref 3.5–5.0)
Alkaline Phosphatase: 61 U/L (ref 38–126)
Anion gap: 10 (ref 5–15)
BUN: 59 mg/dL — ABNORMAL HIGH (ref 8–23)
CO2: 28 mmol/L (ref 22–32)
Calcium: 9.1 mg/dL (ref 8.9–10.3)
Chloride: 96 mmol/L — ABNORMAL LOW (ref 98–111)
Creatinine, Ser: 1.7 mg/dL — ABNORMAL HIGH (ref 0.44–1.00)
GFR calc non Af Amer: 27 mL/min — ABNORMAL LOW (ref >60)
Glucose, Bld: 116 mg/dL — ABNORMAL HIGH (ref 70–99)
Potassium: 5.2 mmol/L — ABNORMAL HIGH (ref 3.5–5.1)
Sodium: 134 mmol/L — ABNORMAL LOW (ref 135–145)
Total Bilirubin: 0.4 mg/dL (ref 0.3–1.2)
Total Protein: 7.9 g/dL (ref 6.5–8.1)

## 2020-06-29 LAB — CBC WITH DIFFERENTIAL/PLATELET
Abs Immature Granulocytes: 0.03 10*3/uL (ref 0.00–0.07)
Basophils Absolute: 0 10*3/uL (ref 0.0–0.1)
Basophils Relative: 0 %
Eosinophils Absolute: 0 10*3/uL (ref 0.0–0.5)
Eosinophils Relative: 0 %
HCT: 32 % — ABNORMAL LOW (ref 36.0–46.0)
Hemoglobin: 9.9 g/dL — ABNORMAL LOW (ref 12.0–15.0)
Immature Granulocytes: 0 %
Lymphocytes Relative: 7 %
Lymphs Abs: 0.6 10*3/uL — ABNORMAL LOW (ref 0.7–4.0)
MCH: 29.2 pg (ref 26.0–34.0)
MCHC: 30.9 g/dL (ref 30.0–36.0)
MCV: 94.4 fL (ref 80.0–100.0)
Monocytes Absolute: 0.3 10*3/uL (ref 0.1–1.0)
Monocytes Relative: 3 %
Neutro Abs: 6.9 10*3/uL (ref 1.7–7.7)
Neutrophils Relative %: 90 %
Platelets: 210 10*3/uL (ref 150–400)
RBC: 3.39 MIL/uL — ABNORMAL LOW (ref 3.87–5.11)
RDW: 17.2 % — ABNORMAL HIGH (ref 11.5–15.5)
WBC: 7.8 10*3/uL (ref 4.0–10.5)
nRBC: 0 % (ref 0.0–0.2)

## 2020-06-29 LAB — BRAIN NATRIURETIC PEPTIDE: B Natriuretic Peptide: 569 pg/mL — ABNORMAL HIGH (ref 0.0–100.0)

## 2020-06-29 LAB — RESPIRATORY PANEL BY RT PCR (FLU A&B, COVID)
Influenza A by PCR: NEGATIVE
Influenza B by PCR: NEGATIVE
SARS Coronavirus 2 by RT PCR: NEGATIVE

## 2020-06-29 LAB — TROPONIN I (HIGH SENSITIVITY): Troponin I (High Sensitivity): 28 ng/L — ABNORMAL HIGH (ref <18)

## 2020-06-29 MED ORDER — FUROSEMIDE 10 MG/ML IJ SOLN
60.0000 mg | INTRAMUSCULAR | Status: AC
  Administered 2020-06-29: 60 mg via INTRAVENOUS
  Filled 2020-06-29: qty 6

## 2020-06-29 NOTE — ED Triage Notes (Signed)
Pt c/o increased shortness of breath with fluid retention. Pt normally wears 2L of O2 at home.

## 2020-06-29 NOTE — ED Provider Notes (Addendum)
[facility] EMERGENCY DEPARTMENT Provider Note   CSN: [id number] Arrival date & time: 06/29/20  1936     History Chief Complaint  Patient presents with   Shortness of Breath    [name] [name] is a 86 y.o. female.  HPI   This patient is an 86 year old female with a history of chronic diastolic congestive heart failure as well as chronic respiratory failure with hypoxia on 2 L of oxygen chronically.  She has a known history of complete heart block, ejection fraction had been very low in the past but had improved recently, she also has COPD.  In May 2021 the patient had an echocardiogram showing an ejection fraction of 30 to 35%.  She has a known valve replacement and is on Coumadin.  She presents with increasing shortness of breath, this is been going on for several days but much worse in the last 24 hours causing her to be very dyspneic.  She has noted that she has had to turn her oxygen up to 3 L, she is very swollen in the legs, she has been doubling up on her diuretic, furosemide taking 40 mg in the morning and in the evening.  Despite this she continues to be swollen and short of breath which is worsening.  She is speaking in shortened sentences and acutely ill.  She denies fevers or chills, she is fully vaccinated for Covid.  Past Medical History:  Diagnosis Date   Acute on chronic diastolic CHF (congestive heart failure) ([place]) 05/19/2012   Acute on chronic respiratory failure with hypoxia ([place]) 01/31/2020   Adenomatous polyp of colon 12/16/2002   Dr. [name]/[facility]   Allergy    Calculus of gallbladder with chronic cholecystitis without obstruction 02/09/2009   Qualifier: Diagnosis of  By: [name], Margaret     Cataract    CHB (complete heart block) ([place]) 10/07/2014       CHF (congestive heart failure) ([place])    a.  EF previously reduced to 25-30% b. EF improved to 60-65% by echo in 10/2017.    Chronic kidney disease, stage I 2006     Constipation        COPD (chronic obstructive pulmonary disease) (HCC)        DJD (degenerative joint disease) of lumbar spine    DM (diabetes mellitus) ([place]) 2006   Esophageal reflux    Glaucoma    Gout        H/O adenomatous polyp of colon 05/24/2012   2004 in [place] colonoscopy 2008 normal     H/O heart valve replacement with mechanical valve    a. s/p mechanical AVR in 2001 and mechanical MVR in 2004.   H/O wisdom tooth extraction    HIP PAIN, RIGHT 04/23/2009   Qualifier: Diagnosis of  By: [name], Margaret     Hyperlipemia    IDA (iron deficiency anemia)    Parenteral iron/[name]   Insomnia        Nausea without vomiting 06/07/2010   Qualifier: Diagnosis of  By: [name], Margaret     Non-ischemic cardiomyopathy [facility])    a. s/p MDT CRTD   Obesity, unspecified    Other and unspecified hyperlipidemia    Oxygen deficiency 2014   nocturnal;   Papilloma of breast 03/06/2012   This was diagnosed as a left breast papilloma by ultrasound characteristics and symptoms in 2010. Because of possible medical risk factors no surgical intervention has been done and it  did doing annual followups. The area has been stable by physical examination and mammograms and ultrasound since 2010.    Spinal stenosis of lumbar region 02/19/2013   Spondylolisthesis    Spondylosis    Thyroid cancer ([employer])    remote thyroidectomy, no recurrence, pt denies in 2016 thyroid cancer   Type 2 diabetes mellitus with hyperglycemia ([place]) 07/15/2010   Unspecified hypothyroidism        Varicose veins of lower extremities with other complications    Vertigo         Patient Active Problem List   Diagnosis Date Noted   Arthritis of right knee 06/20/2020   Leg swelling 06/16/2020   Generalized osteoarthritis 05/09/2020   Primary osteoarthritis, left shoulder 24/23/5361   Acute systolic CHF (congestive heart failure) ([place]) 03/31/2020   Bilateral lower  extremity edema    Acute on chronic congestive heart failure ([place]) 03/30/2020   Right-sided epistaxis 03/28/2020   Acute pain of left shoulder 01/15/2020   AAA (abdominal aortic aneurysm) without rupture ([place]) 01/14/2020   COVID-19 virus detected 11/23/2019   Stage 3b chronic kidney disease ([place]) 08/04/2019   Dyspnea 05/30/2019   Anemia 05/30/2019   Gout    Glaucoma    GERD (gastroesophageal reflux disease)    Physical debility 04/28/2019   Encounter for examination following treatment at hospital 11/03/2018   Hyponatremia 09/27/2018   H/O mitral valve replacement with mechanical valve 08/02/2018   Chronic right hip pain 44/31/5400   Chronic systolic congestive heart failure ([place]) 11/15/2017   Trochanteric bursitis, right hip 03/07/2017   Persistent atrial fibrillation ([place])    Nocturnal hypoxia 10/08/2016   Dependence on nocturnal oxygen therapy 10/08/2016   Hematuria 03/20/2016   S/P ICD (internal cardiac defibrillator) procedure 02/08/2015   Presence of cardiac pacemaker 02/08/2015   CHB (complete heart block) ([place]) 10/07/2014   Fatigue 07/20/2014   Absolute anemia 06/24/2014   Osteopenia 02/10/2014   Encounter for therapeutic drug monitoring 10/29/2013   OA (osteoarthritis) of knee 02/19/2013   Chronic pain of right knee 01/02/2013   Hemolytic anemia ([place]) 09/24/2012   High risk medication use 05/24/2012   COPD (chronic obstructive pulmonary disease) ([place]) 05/19/2012   Chronic anticoagulation, on coumadin for Mechanical AVR and MVR 04/01/2012   Cardiorenal syndrome with renal failure 03/22/2012   Hx of aortic valve replacement, mechanical 03/22/2012   S/P MVR (mitral valve replacement) 03/22/2012   Biventricular automatic implantable cardioverter defibrillator in situ 03/22/2012   CKD (chronic kidney disease), stage IV ([place]) 03/22/2012   Warfarin-induced coagulopathy ([place]) 03/21/2012   Iron deficiency 10/04/2011   Allergic  rhinitis 02/14/2011   Hypothyroidism 07/15/2010   Mitral valve disease 07/15/2010   Sinus node dysfunction ([place]) 07/15/2010   VARICOSE VEINS LOWER EXTREMITIES W/OTH COMPS 02/09/2009   Prediabetes 06/15/2008   Hypothyroidism, postsurgical 02/24/2008   Hyperlipidemia LDL goal <70 02/24/2008   Essential hypertension 02/24/2008   Non-ischemic cardiomyopathy with ICD 02/24/2008   Spondylosis 02/24/2008    Past Surgical History:  Procedure Laterality Date   AORTIC AND MITRAL VALVE REPLACEMENT  2001   BI-VENTRICULAR IMPLANTABLE CARDIOVERTER DEFIBRILLATOR UPGRADE N/A 01/18/2015   Procedure: BI-VENTRICULAR IMPLANTABLE CARDIOVERTER DEFIBRILLATOR UPGRADE;  Surgeon: Evans Lance, MD;  Location: [facility] CATH LAB;  Service: Cardiovascular;  Laterality: N/A;   BIV ICD GENERATOR CHANGEOUT N/A 01/30/2019   Procedure: BIV ICD GENERATOR CHANGEOUT;  Surgeon: Evans Lance, MD;  Location: [place] CV LAB;  Service: Cardiovascular;  Laterality: N/A;   CARDIAC CATHETERIZATION  CARDIAC VALVE REPLACEMENT     CARDIOVERSION N/A 11/13/2016   Procedure: CARDIOVERSION;  Surgeon: Sanda Klein, MD;  Location: [place];  Service: Cardiovascular;  Laterality: N/A;   COLONOSCOPY  06/12/2007   [place]k- Normal rectum/Normal colon   COLONOSCOPY  12/16/02   small hemorrhoids   COLONOSCOPY  06/20/2012   Procedure: COLONOSCOPY;  Surgeon: Daneil Dolin, MD;  [facility];  Service: Endoscopy;  Laterality: N/A;  10:45   defibrillator implanted 2006     DOPPLER ECHOCARDIOGRAPHY  2012   DOPPLER ECHOCARDIOGRAPHY  05,06,07,08,09,2011   ESOPHAGOGASTRODUODENOSCOPY  06/12/2007   Rourk- normal esophagus, small hiatal hernia, otherwise normal stomach, D1, D2   EYE SURGERY Right 2005   EYE SURGERY Left 2006   ICD LEAD REMOVAL Left 02/08/2015   Procedure: ICD LEAD REMOVAL;  Surgeon: Evans Lance, MD;  Location: [facility];  Service: Cardiovascular;  Laterality: Left;  "Will plan extraction and  insertion of a BiV PM"  **Dr. Roxan Hockey backing up case**   IMPLANTABLE CARDIOVERTER DEFIBRILLATOR (ICD) GENERATOR CHANGE Left 02/08/2015   Procedure: ICD GENERATOR CHANGE;  Surgeon: Evans Lance, MD;  Location: [place];  Service: Cardiovascular;  Laterality: Left;   INSERT / REPLACE / REMOVE PACEMAKER     PACEMAKER INSERTION  May 2016   pacemaker placed  2004   right breast cyst removed     benign    right cataract removed     2005   TEE WITHOUT CARDIOVERSION N/A 11/13/2016   Procedure: TRANSESOPHAGEAL ECHOCARDIOGRAM (TEE);  Surgeon: Sanda Klein, MD;  Location: [facility] ENDOSCOPY;  Service: Cardiovascular;  Laterality: N/A;   THYROIDECTOMY       OB History    Gravida      Para      Term      Preterm      AB      Living  0     SAB      TAB      Ectopic      Multiple      Live Births              Family History  Problem Relation Age of Onset   Pancreatic cancer Mother    Heart disease Father    Heart disease Sister    Heart attack Sister    Heart disease Brother    Diabetes Brother    Heart disease Brother    Diabetes Brother    Hypertension Brother    Lung cancer Brother     Social History   Tobacco Use   Smoking status: Former Smoker    Packs/day: 0.75    Years: 40.00    Pack years: 30.00    Types: Cigarettes    Quit date: 03/08/1987    Years since quitting: 33.3   Smokeless tobacco: Never Used  Vaping Use   Vaping Use: Never used  Substance Use Topics   Alcohol use: No    Alcohol/week: 0.0 standard drinks   Drug use: No    Home Medications Prior to Admission medications   Medication Sig Start Date End Date Taking? Authorizing Provider  acetaminophen (TYLENOL) 500 MG tablet Take 500 mg by mouth every 6 (six) hours as needed (pain).     [provider]  albuterol (PROVENTIL) (2.5 MG/3ML) 0.083% nebulizer solution Take 3 mLs (2.5 mg total) by nebulization in the morning and at bedtime. *May use additional  one time if needed for shortness of breath 04/02/20   Wynetta Emery,  Clanford L, MD  albuterol (VENTOLIN HFA) 108 (90 Base) MCG/ACT inhaler Inhale 1-2 puffs into the lungs every 6 (six) hours as needed for wheezing or shortness of breath. 04/02/20   Johnson, Clanford L, MD  allopurinol (ZYLOPRIM) 100 MG tablet Take 1 tablet (100 mg total) by mouth in the morning. 04/02/20   Johnson, Clanford L, MD  benzonatate (TESSALON) 100 MG capsule Take 1 capsule (100 mg total) by mouth 2 (two) times daily as needed for cough. 05/11/20   Fayrene Helper, MD  BREO ELLIPTA 100-25 MCG/INH AEPB 1 TAKE 1 INHALATION BY MOUTH ONCE DAILY 08/25/19   Fayrene Helper, MD  cholecalciferol (VITAMIN D3) 25 MCG (1000 UT) tablet Take 1,000 Units by mouth in the morning.     [provider]  docusate sodium (COLACE) 100 MG capsule Take 300 mg by mouth at bedtime.     [provider]  fluticasone (FLONASE) 50 MCG/ACT nasal spray SHAKE LIQUID AND USE 2 SPRAYS IN EACH NOSTRIL DAILY 11/24/19   Fayrene Helper, MD  folic acid (FOLVITE) 1 MG tablet TAKE 1 TABLET(1 MG) BY MOUTH DAILY 11/10/19   Fayrene Helper, MD  furosemide (LASIX) 40 MG tablet Take 40 mg by mouth daily. Take 80mg  BID x3DAYS THEN BACK TO 40MG  BID    [provider]  gabapentin (NEURONTIN) 300 MG capsule TAKE 1 CAPSULE(300 MG) BY MOUTH TWICE DAILY 06/14/20   Fayrene Helper, MD  hydrALAZINE (APRESOLINE) 25 MG tablet Take 1 tablet (25 mg total) by mouth 2 (two) times daily. 06/22/20   Croitoru, Mihai, MD  isosorbide mononitrate (IMDUR) 30 MG 24 hr tablet Take 0.5 tablets (15 mg total) by mouth every morning. 02/04/20   Pokhrel, Corrie Mckusick, MD  Liniments (SALONPAS PAIN RELIEF PATCH EX) Apply 1 patch topically daily as needed (pain).     [provider]  meclizine (ANTIVERT) 12.5 MG tablet TAKE 1 TABLET(12.5 MG) BY MOUTH THREE TIMES DAILY AS NEEDED FOR DIZZINESS Patient taking differently: Take 12.5 mg by mouth 3 (three) times daily as  needed for dizziness.  03/01/20   Fayrene Helper, MD  metolazone (ZAROXOLYN) 2.5 MG tablet Take one tablet on Saturdays. Patient taking differently: Take 2.5 mg by mouth once a week. Take one tablet on Saturdays. 11/24/19   Croitoru, Mihai, MD  metoprolol succinate (TOPROL-XL) 100 MG 24 hr tablet TAKE 1 TABLET BY MOUTH EVERY DAY WITH FOOD OR MILK Patient taking differently: Take 100 mg by mouth daily. TAKE 1 TABLET BY MOUTH EVERY DAY WITH FOOD OR MILK 05/05/20   Fayrene Helper, MD  nitroGLYCERIN (NITROSTAT) 0.4 MG SL tablet Place 1 tablet (0.4 mg total) under the tongue every 5 (five) minutes as needed for chest pain. 08/03/19   Rolland Porter, MD  ondansetron (ZOFRAN ODT) 4 MG disintegrating tablet Take 1 tablet (4 mg total) by mouth every 8 (eight) hours as needed for nausea or vomiting. 01/26/20   Fawze, Mina A, PA-C  Polyethyl [name] (SYSTANE) 0.4-0.3 % GEL ophthalmic gel Place 1 application into the left eye at bedtime.    [provider]  polyethylene glycol powder (GLYCOLAX/MIRALAX) 17 GM/SCOOP powder Take 17 g by mouth daily as needed for mild constipation or moderate constipation.    [provider]  potassium chloride SA (KLOR-CON) 20 MEQ tablet Take 1 tablet (20 mEq total) by mouth daily. TAKE 40MEQ DAILY x3 DAYS THEN BACK TO 20MEQ DAILY Patient taking differently: Take 20 mEq by mouth daily.  04/23/20  Deberah Pelton, NP  pravastatin (PRAVACHOL) 40 MG tablet TAKE 1 TABLET BY MOUTH EVERY EVENING Patient taking differently: Take 40 mg by mouth daily.  11/10/19   Fayrene Helper, MD  SYNTHROID 75 MCG tablet Take 1 tablet (75 mcg total) by mouth daily before breakfast. 04/02/20   Irwin Brakeman L, MD  warfarin (JANTOVEN) 5 MG tablet Take 1-2 tablets (5-10 mg total) by mouth See admin instructions. TAKE 2 TABLETS EVERY DAY EXCEPT 1 TABLET ON TUESDAYS @ 8:30PM 04/02/20   Irwin Brakeman L, MD    Allergies    Penicillins, Aspirin, Fentanyl, and  Niacin  Review of Systems   Review of Systems  All other systems reviewed and are negative.   Physical Exam Updated Vital Signs Ht 1.702 m (5\' 7" )    Wt 93 kg    BMI 32.11 kg/m   Physical Exam Vitals and nursing note reviewed.  Constitutional:      Appearance: She is well-developed. She is ill-appearing.  HENT:     Head: Normocephalic and atraumatic.     Mouth/Throat:     Pharynx: No oropharyngeal exudate.  Eyes:     General: No scleral icterus.       Right eye: No discharge.        Left eye: No discharge.     Conjunctiva/sclera: Conjunctivae normal.     Pupils: Pupils are equal, round, and reactive to light.  Neck:     Thyroid: No thyromegaly.     Vascular: No JVD.     Comments: JVD present in the sitting position Cardiovascular:     Rate and Rhythm: Normal rate and regular rhythm.     Heart sounds: Murmur heard.  No friction rub. No gallop.   Pulmonary:     Effort: Respiratory distress present.     Breath sounds: Rales present. No wheezing.     Comments: Subtle rales at bases Abdominal:     General: Bowel sounds are normal. There is no distension.     Palpations: Abdomen is soft. There is no mass.     Tenderness: There is no abdominal tenderness.  Musculoskeletal:        General: No tenderness. Normal range of motion.     Cervical back: Normal range of motion and neck supple.     Right lower leg: Edema present.     Left lower leg: Edema present.     Comments: Significant edema to legs - symmetrical - pitting 3+  Lymphadenopathy:     Cervical: No cervical adenopathy.  Skin:    General: Skin is warm and dry.     Findings: No erythema or rash.  Neurological:     General: No focal deficit present.     Mental Status: She is alert.     Coordination: Coordination normal.  Psychiatric:        Behavior: Behavior normal.     ED Results / Procedures / Treatments   Labs (all labs ordered are listed, but only abnormal results are displayed) Labs Reviewed  CBC  WITH DIFFERENTIAL/PLATELET - Abnormal; Notable for the following components:      Result Value   RBC 3.39 (*)    Hemoglobin 9.9 (*)    HCT 32.0 (*)    RDW 17.2 (*)    Lymphs Abs 0.6 (*)    All other components within normal limits  COMPREHENSIVE METABOLIC PANEL - Abnormal; Notable for the following components:   Sodium 134 (*)    Potassium 5.2 (*)  Chloride 96 (*)    Glucose, Bld 116 (*)    BUN 59 (*)    Creatinine, Ser 1.70 (*)    GFR calc non Af Amer 27 (*)    All other components within normal limits  BRAIN NATRIURETIC PEPTIDE - Abnormal; Notable for the following components:   B Natriuretic Peptide 569.0 (*)    All other components within normal limits  TROPONIN I (HIGH SENSITIVITY) - Abnormal; Notable for the following components:   Troponin I (High Sensitivity) 28 (*)    All other components within normal limits  RESPIRATORY PANEL BY RT PCR (FLU A&B, COVID)    EKG EKG Interpretation  Date/Time:  Tuesday June 29 2020 20:07:56 EDT Ventricular Rate:  60 PR Interval:    QRS Duration: 171 QT Interval:  493 QTC Calculation: 493 R Axis:   0 Text Interpretation: Ventricular-paced complexes No further rhythm analysis attempted due to paced rhythm Consider left atrial enlargement LVH with secondary repolarization abnormality Borderline prolonged QT interval since last tracing no significant change Confirmed by Noemi Chapel [phone]) on 06/29/2020 8:30:22 PM   Radiology DG Chest Port 1 View  Result Date: 06/29/2020 CLINICAL DATA:  Shortness of breath.  Pulmonary edema. EXAM: PORTABLE CHEST 1 VIEW COMPARISON:  Radiograph 06/25/2020, additional priors. CT 05/27/2020. FINDINGS: Stable cardiomegaly. Unchanged mediastinal contours. Aortic atherosclerosis. Pacemaker in place with prosthetic cardiac valves. Similar appearance of interstitial coarsening from prior exam. No progression. No focal airspace disease. No pleural fluid. No pneumothorax. Stable osseous structures. IMPRESSION:  1. Stable cardiomegaly. 2. Unchanged interstitial coarsening, this is similar to multiple prior exams. Septal thickening on prior CT suggests an element of pulmonary edema. There is background emphysema on CT. 3. No new or acute airspace disease Electronically Signed   By: Keith Rake M.D.   On: 06/29/2020 20:31    Procedures .Critical Care Performed by: Noemi Chapel, MD Authorized by: Noemi Chapel, MD   Critical care provider statement:    Critical care time (minutes):  35   Critical care time was exclusive of:  Separately billable procedures and treating other patients and teaching time   Critical care was necessary to treat or prevent imminent or life-threatening deterioration of the following conditions:  Cardiac failure and respiratory failure   Critical care was time spent personally by me on the following activities:  Blood draw for specimens, development of treatment plan with patient or surrogate, discussions with consultants, evaluation of patient's response to treatment, examination of patient, obtaining history from patient or surrogate, ordering and performing treatments and interventions, ordering and review of laboratory studies, ordering and review of radiographic studies, pulse oximetry, re-evaluation of patient's condition and review of old charts   (including critical care time)  Medications Ordered in ED Medications  furosemide (LASIX) injection 60 mg (60 mg Intravenous Given 06/29/20 2006)    ED Course  I have reviewed the triage vital signs and the nursing notes.  Pertinent labs & imaging results that were available during my care of the patient were reviewed by me and considered in my medical decision making (see chart for details).    MDM Rules/Calculators/A&P                          The patient is clearly fluid overloaded, she has JVD in the sitting position and has known congestive heart failure.  I suspect that she has had a CHF exacerbation more than  COPD or other cause of respiratory distress.  X-ray labs EKG pending, she will need to be diuresed and likely admit given her increased work of breathing with severe peripheral edema.  Pt has labs showing Cr at basline K was 5.2, CBC with mild chronic anemia BNP is elevated also c/w CHF  D/w hostpiatlist who will admit Lasix 60mg  IV given.  Athenia Rys was evaluated in Emergency Department on 06/29/2020 for the symptoms described in the history of present illness. She was evaluated in the context of the global COVID-19 pandemic, which necessitated consideration that the patient might be at risk for infection with the SARS-CoV-2 virus that causes COVID-19. Institutional protocols and algorithms that pertain to the evaluation of patients at risk for COVID-19 are in a state of rapid change based on information released by regulatory bodies including the CDC and federal and state organizations. These policies and algorithms were followed during the patient's care in the ED.    Final Clinical Impression(s) / ED Diagnoses Final diagnoses:  Acute on chronic congestive heart failure, unspecified heart failure type ([place])  Other hypervolemia      Noemi Chapel, MD 06/29/20 2217    Noemi Chapel, MD 06/29/20 2217

## 2020-06-29 NOTE — H&P (Signed)
TRH H&P    Patient Demographics:    [name] [name], is a 86 y.o. female  MRN: [id number]  DOB - 12-29-1931  Admit Date - 06/29/2020  Referring MD/NP/PA: Sabra Heck  Outpatient Primary MD for the patient is Fayrene Helper, MD  Patient coming from: Home  Chief complaint- dyspnea   HPI:    [name] [name]  is a 86 y.o. female, with history of hypothyroidism, T2DM, Non-ischemic cardiomyopathy, HLD, GERD, CKD, CHF, aortic valve repair, chronic respiratory failure, presents to the ED with dyspnea. Patient reports that it started one week ago, and has been progressively worse since. She has doubled her own lasix dose at home, and reports increased urine output, but the dyspnea has continued. She has associated peripheral edema and 15 pound weight gain. She also reports orthopnea. Patient has no chest pain, no palpitations, fevers, or weakness. On ROS she does report a cough that is occasionally productive of sputum - it has been ongoing. She also reports constipation. No other concerns at this time.   ED course T 98.6, HR 59, R18-28, BP 165/68 98% on 3L WBC 7.8, Hgb 9.9 BUN/Cr 59/1.70 BNP 569 Trop 28 Covid negative CXR = Cardiomegaly, No new or acute airspace disease, pulm edema Lasix 60mg      Review of systems:    In addition to the HPI above,  Review of Systems  Constitutional: Positive for malaise/fatigue. Negative for chills and fever.       15 pound weight gain  HENT: Negative for congestion, sinus pain and sore throat.   Eyes: Negative for blurred vision, double vision, discharge and redness.  Respiratory: Positive for cough, sputum production and shortness of breath. Negative for wheezing.   Cardiovascular: Positive for orthopnea and leg swelling. Negative for chest pain and palpitations.  Gastrointestinal: Negative for abdominal pain, diarrhea, nausea and vomiting.  Genitourinary: Positive for  frequency. Negative for dysuria and urgency.  Musculoskeletal: Negative for myalgias.  Skin: Negative for itching and rash.  Neurological: Negative for dizziness, weakness and headaches.  Psychiatric/Behavioral: Negative for substance abuse.    All other systems reviewed and are negative.    Past History of the following :    Past Medical History:  Diagnosis Date  . Acute on chronic diastolic CHF (congestive heart failure) ([place]) 05/19/2012  . Acute on chronic respiratory failure with hypoxia ([place]) 01/31/2020  . Adenomatous polyp of colon 12/16/2002   Dr. [name]/[facility]  . Allergy   . Calculus of gallbladder with chronic cholecystitis without obstruction 02/09/2009   Qualifier: Diagnosis of  By: [name], [name]    . Cataract   . CHB (complete heart block) ([place]) 10/07/2014      . CHF (congestive heart failure) ([place])    a.  EF previously reduced to 25-30% b. EF improved to 60-65% by echo in 10/2017.   Marland Kitchen Chronic kidney disease, stage I 2006  . Constipation       . COPD (chronic obstructive pulmonary disease) ([place])       . DJD (degenerative joint  disease) of lumbar spine   . DM (diabetes mellitus) ([place]) 2006  . Esophageal reflux   . Glaucoma   . Gout       . H/O adenomatous polyp of colon 05/24/2012   2004 in [place] colonoscopy 2008 normal    . H/O heart valve replacement with mechanical valve    a. s/p mechanical AVR in 2001 and mechanical MVR in 2004.  Marland Kitchen H/O wisdom tooth extraction   . HIP PAIN, RIGHT 04/23/2009   Qualifier: Diagnosis of  By: [name], [name]    . Hyperlipemia   . IDA (iron deficiency anemia)    Parenteral iron/[name]  . Insomnia       . Nausea without vomiting 06/07/2010   Qualifier: Diagnosis of  By: [name], [name]    . Non-ischemic cardiomyopathy ([place])    a. s/p MDT CRTD  . Obesity, unspecified   . Other and unspecified hyperlipidemia   . Oxygen deficiency 2014   nocturnal;  . Papilloma  of breast 03/06/2012   This was diagnosed as a left breast papilloma by ultrasound characteristics and symptoms in 2010. Because of possible medical risk factors no surgical intervention has been done and it did doing annual followups. The area has been stable by physical examination and mammograms and ultrasound since 2010.   Marland Kitchen Spinal stenosis of lumbar region 02/19/2013  . Spondylolisthesis   . Spondylosis   . Thyroid cancer ([place])    remote thyroidectomy, no recurrence, pt denies in 2016 thyroid cancer  . Type 2 diabetes mellitus with hyperglycemia ([place]) 07/15/2010  . Unspecified hypothyroidism       . 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 Vertigo           Past Surgical History:  Procedure Laterality Date  . AORTIC AND MITRAL VALVE REPLACEMENT  2001  . BI-VENTRICULAR IMPLANTABLE CARDIOVERTER DEFIBRILLATOR UPGRADE N/A 01/18/2015   Procedure: BI-VENTRICULAR IMPLANTABLE CARDIOVERTER DEFIBRILLATOR UPGRADE;  Surgeon: Evans Lance, MD;  Location: [facility] CATH LAB;  Service: Cardiovascular;  Laterality: N/A;  . BIV ICD GENERATOR CHANGEOUT N/A 01/30/2019   Procedure: BIV ICD GENERATOR CHANGEOUT;  Surgeon: Evans Lance, MD;  Location: [place] CV LAB;  Service: Cardiovascular;  Laterality: N/A;  . CARDIAC CATHETERIZATION    . CARDIAC VALVE REPLACEMENT    . CARDIOVERSION N/A 11/13/2016   Procedure: CARDIOVERSION;  Surgeon: Sanda Klein, MD;  Location: [facility] ENDOSCOPY;  Service: Cardiovascular;  Laterality: N/A;  . COLONOSCOPY  06/12/2007   Rourk- Normal rectum/Normal colon  . COLONOSCOPY  12/16/02   small hemorrhoids  . COLONOSCOPY  06/20/2012   Procedure: COLONOSCOPY;  Surgeon: Daneil Dolin, MD;  Location: [facility] ENDO [facility];  Service: Endoscopy;  Laterality: N/A;  10:45  . defibrillator implanted 2006    . DOPPLER ECHOCARDIOGRAPHY  2012  . DOPPLER ECHOCARDIOGRAPHY  05,06,07,08,09,2011  . ESOPHAGOGASTRODUODENOSCOPY  06/12/2007   Rourk- normal esophagus, small hiatal  hernia, otherwise normal stomach, D1, D2  . EYE SURGERY Right 2005  . EYE SURGERY Left 2006  . ICD LEAD REMOVAL Left 02/08/2015   Procedure: ICD LEAD REMOVAL;  Surgeon: Evans Lance, MD;  Location: [facility] OR;  Service: Cardiovascular;  Laterality: Left;  "Will plan extraction and insertion of a BiV PM"  **Dr. Roxan Hockey backing up case**  . IMPLANTABLE CARDIOVERTER DEFIBRILLATOR (ICD) GENERATOR CHANGE Left 02/08/2015   Procedure: ICD GENERATOR CHANGE;  Surgeon: Evans Lance, MD;  Location: [place];  Service: Cardiovascular;  Laterality: Left;  .  INSERT / REPLACE / REMOVE PACEMAKER    . PACEMAKER INSERTION  May 2016  . pacemaker placed  2004  . right breast cyst removed     benign   . right cataract removed     2005  . TEE WITHOUT CARDIOVERSION N/A 11/13/2016   Procedure: TRANSESOPHAGEAL ECHOCARDIOGRAM (TEE);  Surgeon: Sanda Klein, MD;  Location: [facility] ENDOSCOPY;  Service: Cardiovascular;  Laterality: N/A;  . THYROIDECTOMY        Social History:      Social History   Tobacco Use  . Smoking status: Former Smoker    Packs/day: 0.75    Years: 40.00    Pack years: 30.00    Types: Cigarettes    Quit date: 03/08/1987    Years since quitting: 33.3  . Smokeless tobacco: Never Used  Substance Use Topics  . Alcohol use: No    Alcohol/week: 0.0 standard drinks       Family History :     Family History  Problem Relation Age of Onset  . Pancreatic cancer Mother   . Heart disease Father   . Heart disease Sister   . Heart attack Sister   . Heart disease Brother   . Diabetes Brother   . Heart disease Brother   . Diabetes Brother   . Hypertension Brother   . Lung cancer Brother       Home Medications:   Prior to Admission medications   Medication Sig Start Date End Date Taking? Authorizing Provider  acetaminophen (TYLENOL) 500 MG tablet Take 500 mg by mouth every 6 (six) hours as needed (pain).    Yes [provider]  albuterol (PROVENTIL) (2.5 MG/3ML) 0.083%  nebulizer solution Take 3 mLs (2.5 mg total) by nebulization in the morning and at bedtime. *May use additional one time if needed for shortness of breath 04/02/20  Yes Johnson, Clanford L, MD  albuterol (VENTOLIN HFA) 108 (90 Base) MCG/ACT inhaler Inhale 1-2 puffs into the lungs every 6 (six) hours as needed for wheezing or shortness of breath. 04/02/20  Yes Johnson, Clanford L, MD  allopurinol (ZYLOPRIM) 100 MG tablet Take 1 tablet (100 mg total) by mouth in the morning. 04/02/20  Yes Johnson, Clanford L, MD  BREO ELLIPTA 100-25 MCG/INH AEPB 1 TAKE 1 INHALATION BY MOUTH ONCE DAILY 08/25/19  Yes Fayrene Helper, MD  cholecalciferol (VITAMIN D3) 25 MCG (1000 UT) tablet Take 1,000 Units by mouth in the morning.    Yes [provider]  docusate sodium (COLACE) 100 MG capsule Take 300 mg by mouth at bedtime.    Yes [provider]  fluticasone (FLONASE) 50 MCG/ACT nasal spray SHAKE LIQUID AND USE 2 SPRAYS IN EACH NOSTRIL DAILY 11/24/19  Yes Fayrene Helper, MD  folic acid (FOLVITE) 1 MG tablet TAKE 1 TABLET(1 MG) BY MOUTH DAILY 11/10/19  Yes Fayrene Helper, MD  furosemide (LASIX) 40 MG tablet Take 40 mg by mouth daily. Take 80mg  BID x3DAYS THEN BACK TO 40MG  BID   Yes [provider]  gabapentin (NEURONTIN) 300 MG capsule TAKE 1 CAPSULE(300 MG) BY MOUTH TWICE DAILY Patient taking differently: Take 300 mg by mouth daily.  06/14/20  Yes Fayrene Helper, MD  hydrALAZINE (APRESOLINE) 25 MG tablet Take 1 tablet (25 mg total) by mouth 2 (two) times daily. 06/22/20  Yes Croitoru, Mihai, MD  isosorbide mononitrate (IMDUR) 30 MG 24 hr tablet Take 0.5 tablets (15 mg total) by mouth every morning. 02/04/20  Yes Pokhrel, Corrie Mckusick, MD  Liniments (SALONPAS PAIN RELIEF PATCH EX) Apply 1 patch topically daily as needed (pain).    Yes [provider]  meclizine (ANTIVERT) 12.5 MG tablet TAKE 1 TABLET(12.5 MG) BY MOUTH THREE TIMES DAILY AS NEEDED FOR DIZZINESS Patient taking  differently: Take 12.5 mg by mouth 3 (three) times daily as needed for dizziness.  03/01/20  Yes Fayrene Helper, MD  metolazone (ZAROXOLYN) 2.5 MG tablet Take one tablet on Saturdays. Patient taking differently: Take 2.5 mg by mouth once a week. Take one tablet on Saturdays. 11/24/19  Yes Croitoru, Mihai, MD  metoprolol succinate (TOPROL-XL) 100 MG 24 hr tablet TAKE 1 TABLET BY MOUTH EVERY DAY WITH FOOD OR MILK Patient taking differently: Take 100 mg by mouth daily. TAKE 1 TABLET BY MOUTH EVERY DAY WITH FOOD OR MILK 05/05/20  Yes Fayrene Helper, MD  nitroGLYCERIN (NITROSTAT) 0.4 MG SL tablet Place 1 tablet (0.4 mg total) under the tongue every 5 (five) minutes as needed for chest pain. 08/03/19  Yes Rolland Porter, MD  ondansetron (ZOFRAN ODT) 4 MG disintegrating tablet Take 1 tablet (4 mg total) by mouth every 8 (eight) hours as needed for nausea or vomiting. 01/26/20  Yes Fawze, Mina A, PA-C  Polyethyl [name] (SYSTANE) 0.4-0.3 % GEL ophthalmic gel Place 1 application into the left eye at bedtime.   Yes [provider]  polyethylene glycol powder (GLYCOLAX/MIRALAX) 17 GM/SCOOP powder Take 17 g by mouth daily as needed for mild constipation or moderate constipation.   Yes [provider]  potassium chloride SA (KLOR-CON) 20 MEQ tablet Take 1 tablet (20 mEq total) by mouth daily. TAKE 40MEQ DAILY x3 DAYS THEN BACK TO 20MEQ DAILY Patient taking differently: Take 20 mEq by mouth daily.  04/23/20  Yes Cleaver, Jossie Ng, NP  pravastatin (PRAVACHOL) 40 MG tablet TAKE 1 TABLET BY MOUTH EVERY EVENING Patient taking differently: Take 40 mg by mouth daily.  11/10/19  Yes Fayrene Helper, MD  SYNTHROID 75 MCG tablet Take 1 tablet (75 mcg total) by mouth daily before breakfast. 04/02/20  Yes Johnson, Clanford L, MD  warfarin (JANTOVEN) 5 MG tablet Take 1-2 tablets (5-10 mg total) by mouth See admin instructions. TAKE 2 TABLETS EVERY DAY EXCEPT 1 TABLET ON TUESDAYS @ 8:30PM 04/02/20  Yes  Johnson, Clanford L, MD  benzonatate (TESSALON) 100 MG capsule Take 1 capsule (100 mg total) by mouth 2 (two) times daily as needed for cough. Patient not taking: Reported on 06/29/2020 05/11/20   Fayrene Helper, MD     Allergies:     Allergies  Allergen Reactions  . Penicillins Other (See Comments)    Bruise    . Aspirin Other (See Comments)    On coumadin  . Fentanyl Dermatitis    Rash with patch   . Niacin Itching     Physical Exam:   Vitals  Blood pressure (!) 165/68, pulse (!) 59, temperature 98.6 F (37 C), temperature source Oral, resp. rate (!) 28, height 5\' 7"  (1.702 m), weight 93 kg, SpO2 100 %.  1.  General: Lying supine in bed in no acute distress 2. Psychiatric: Mood and behavior normal for situation  3. Neurologic: Cranial nerves II through XII are grossly intact, equal sensation in the upper and lower extremities bilaterally, nonfocal exam  4. HEENMT:  Head is atraumatic, normocephalic lower dentition, trachea midline. Chronic peri-orbital swelling around left eye.   5. Respiratory : Fine crackles in the lower lung fields, no wheezes or rhonchi noted  6. Cardiovascular : Heart rate is normal, rhythm is regular, ejection click, peripheral edema  7. Gastrointestinal:  Abdomen is soft, nondistended, nontender to palpation   8. Skin:  No acute lesions on limited skin exam  9.Musculoskeletal:  3+ pitting edema to upper thigh    Data Review:    CBC Recent Labs  Lab 06/29/20 2002  WBC 7.8  HGB 9.9*  HCT 32.0*  PLT 210  MCV 94.4  MCH 29.2  MCHC 30.9  RDW 17.2*  LYMPHSABS 0.6*  MONOABS 0.3  EOSABS 0.0  BASOSABS 0.0   ------------------------------------------------------------------------------------------------------------------  Results for orders placed or performed during the hospital encounter of 06/29/20 (from the past 48 hour(s))  Respiratory Panel by RT PCR (Flu A&B, Covid) - Nasopharyngeal Swab     Status: None    Collection Time: 06/29/20  7:57 PM   Specimen: Nasopharyngeal Swab  Result Value Ref Range   SARS Coronavirus 2 by RT PCR NEGATIVE NEGATIVE    Comment: (NOTE) SARS-CoV-2 target nucleic acids are NOT DETECTED.  The SARS-CoV-2 RNA is generally detectable in upper respiratoy specimens during the acute phase of infection. The lowest concentration of SARS-CoV-2 viral copies this assay can detect is 131 copies/mL. A negative result does not preclude SARS-Cov-2 infection and should not be used as the sole basis for treatment or other patient management decisions. A negative result may occur with  improper specimen collection/handling, submission of specimen other than nasopharyngeal swab, presence of viral mutation(s) within the areas targeted by this assay, and inadequate number of viral copies (<131 copies/mL). A negative result must be combined with clinical observations, patient history, and epidemiological information. The expected result is Negative.  Fact Sheet for Patients:  PinkCheek.be  Fact Sheet for Healthcare Providers:  GravelBags.it  This test is no t yet approved or cleared by the [place] FDA and  has been authorized for detection and/or diagnosis of SARS-CoV-2 by FDA under an Emergency Use Authorization (EUA). This EUA will remain  in effect (meaning this test can be used) for the duration of the COVID-19 declaration under Section 564(b)(1) of the Act, 21 U.S.C. section 360bbb-3(b)(1), unless the authorization is terminated or revoked sooner.     Influenza A by PCR NEGATIVE NEGATIVE   Influenza B by PCR NEGATIVE NEGATIVE    Comment: (NOTE) The Xpert Xpress SARS-CoV-2/FLU/RSV assay is intended as an aid in  the diagnosis of influenza from Nasopharyngeal swab specimens and  should not be used as a sole basis for treatment. Nasal washings and  aspirates are unacceptable for Xpert Xpress SARS-CoV-2/FLU/RSV    testing.  Fact Sheet for Patients: PinkCheek.be  Fact Sheet for Healthcare Providers: GravelBags.it  This test is not yet approved or cleared by the [place] FDA and  has been authorized for detection and/or diagnosis of SARS-CoV-2 by  FDA under an Emergency Use Authorization (EUA). This EUA will remain  in effect (meaning this test can be used) for the duration of the  Covid-19 declaration under Section 564(b)(1) of the Act, 21  U.S.C. section 360bbb-3(b)(1), unless the authorization is  terminated or revoked. Performed at [facility], [address], [place], [place] [address]   CBC with Differential/Platelet     Status: Abnormal   Collection Time: 06/29/20  8:02 PM  Result Value Ref Range   WBC 7.8 4.0 - 10.5 K/uL   RBC 3.39 (L) 3.87 - 5.11 MIL/uL   Hemoglobin 9.9 (L) 12.0 - 15.0 g/dL   HCT 32.0 (L) 36 - 46 %  MCV 94.4 80.0 - 100.0 fL   MCH 29.2 26.0 - 34.0 pg   MCHC 30.9 30.0 - 36.0 g/dL   RDW 17.2 (H) 11.5 - 15.5 %   Platelets 210 150 - 400 K/uL   nRBC 0.0 0.0 - 0.2 %   Neutrophils Relative % 90 %   Neutro Abs 6.9 1.7 - 7.7 K/uL   Lymphocytes Relative 7 %   Lymphs Abs 0.6 (L) 0.7 - 4.0 K/uL   Monocytes Relative 3 %   Monocytes Absolute 0.3 0 - 1 K/uL   Eosinophils Relative 0 %   Eosinophils Absolute 0.0 0 - 0 K/uL   Basophils Relative 0 %   Basophils Absolute 0.0 0 - 0 K/uL   Immature Granulocytes 0 %   Abs Immature Granulocytes 0.03 0.00 - 0.07 K/uL    Comment: Performed at [facility], [address], [place], [place] [address]  Comprehensive metabolic panel     Status: Abnormal   Collection Time: 06/29/20  8:02 PM  Result Value Ref Range   Sodium 134 (L) 135 - 145 mmol/L   Potassium 5.2 (H) 3.5 - 5.1 mmol/L   Chloride 96 (L) 98 - 111 mmol/L   CO2 28 22 - 32 mmol/L   Glucose, Bld 116 (H) 70 - 99 mg/dL    Comment: Glucose reference range applies only to samples taken after fasting for at least 8  hours.   BUN 59 (H) 8 - 23 mg/dL   Creatinine, Ser 1.70 (H) 0.44 - 1.00 mg/dL   Calcium 9.1 8.9 - 10.3 mg/dL   Total Protein 7.9 6.5 - 8.1 g/dL   Albumin 3.8 3.5 - 5.0 g/dL   AST 34 15 - 41 U/L   ALT 18 0 - 44 U/L   Alkaline Phosphatase 61 38 - 126 U/L   Total Bilirubin 0.4 0.3 - 1.2 mg/dL   GFR calc non Af Amer 27 (L) >60 mL/min   Anion gap 10 5 - 15    Comment: Performed at [facility], [address], [place], [place] [address]  Brain natriuretic peptide     Status: Abnormal   Collection Time: 06/29/20  8:02 PM  Result Value Ref Range   B Natriuretic Peptide 569.0 (H) 0.0 - 100.0 pg/mL    Comment: Performed at [facility], [address] [place], [place] [address]  Troponin I (High Sensitivity)     Status: Abnormal   Collection Time: 06/29/20  8:02 PM  Result Value Ref Range   Troponin I (High Sensitivity) 28 (H) <18 ng/L    Comment: (NOTE) Elevated high sensitivity troponin I (hsTnI) values and significant  changes across serial measurements may suggest ACS but many other  chronic and acute conditions are known to elevate hsTnI results.  Refer to the "Links" section for chest pain algorithms and additional  guidance. Performed at [facility], [address], [place], [place] [address]    *Note: Due to a large number of results and/or encounters for the requested time period, some results have not been displayed. A complete set of results can be found in Results Review.    Chemistries  Recent Labs  Lab 06/29/20 2002  NA 134*  K 5.2*  CL 96*  CO2 28  GLUCOSE 116*  BUN 59*  CREATININE 1.70*  CALCIUM 9.1  AST 34  ALT 18  ALKPHOS 61  BILITOT 0.4   ------------------------------------------------------------------------------------------------------------------  ------------------------------------------------------------------------------------------------------------------ GFR: Estimated Creatinine Clearance: 27.8 mL/min (A) (by C-G formula based on SCr of  1.7  mg/dL (H)). Liver Function Tests: Recent Labs  Lab 06/29/20 2002  AST 34  ALT 18  ALKPHOS 61  BILITOT 0.4  PROT 7.9  ALBUMIN 3.8   No results for input(s): LIPASE, AMYLASE in the last 168 hours. No results for input(s): AMMONIA in the last 168 hours. Coagulation Profile: No results for input(s): INR, PROTIME in the last 168 hours. Cardiac Enzymes: No results for input(s): CKTOTAL, CKMB, CKMBINDEX, TROPONINI in the last 168 hours. BNP (last 3 results) No results for input(s): PROBNP in the last 8760 hours. HbA1C: No results for input(s): HGBA1C in the last 72 hours. CBG: No results for input(s): GLUCAP in the last 168 hours. Lipid Profile: No results for input(s): CHOL, HDL, LDLCALC, TRIG, CHOLHDL, LDLDIRECT in the last 72 hours. Thyroid Function Tests: No results for input(s): TSH, T4TOTAL, FREET4, T3FREE, THYROIDAB in the last 72 hours. Anemia Panel: No results for input(s): VITAMINB12, FOLATE, FERRITIN, TIBC, IRON, RETICCTPCT in the last 72 hours.  --------------------------------------------------------------------------------------------------------------- Urine analysis:    Component Value Date/Time   COLORURINE YELLOW 01/26/2020 2055   APPEARANCEUR CLEAR 01/26/2020 2055   LABSPEC 1.010 01/26/2020 2055   PHURINE 5.0 01/26/2020 2055   GLUCOSEU NEGATIVE 01/26/2020 2055   GLUCOSEU NEG mg/dL 03/22/2010 2313   HGBUR NEGATIVE 01/26/2020 2055   HGBUR negative 05/27/2010 1122   BILIRUBINUR NEGATIVE 01/26/2020 2055   BILIRUBINUR negative 04/28/2019 1500   BILIRUBINUR neg 03/11/2013 1443   KETONESUR NEGATIVE 01/26/2020 2055   PROTEINUR NEGATIVE 01/26/2020 2055   UROBILINOGEN 0.2 04/28/2019 1500   UROBILINOGEN 0.2 03/27/2012 1201   NITRITE NEGATIVE 01/26/2020 2055   LEUKOCYTESUR NEGATIVE 01/26/2020 2055      Imaging Results:    DG Chest Port 1 View  Result Date: 06/29/2020 CLINICAL DATA:  Shortness of breath.  Pulmonary edema. EXAM: PORTABLE CHEST 1 VIEW  COMPARISON:  Radiograph 06/25/2020, additional priors. CT 05/27/2020. FINDINGS: Stable cardiomegaly. Unchanged mediastinal contours. Aortic atherosclerosis. Pacemaker in place with prosthetic cardiac valves. Similar appearance of interstitial coarsening from prior exam. No progression. No focal airspace disease. No pleural fluid. No pneumothorax. Stable osseous structures. IMPRESSION: 1. Stable cardiomegaly. 2. Unchanged interstitial coarsening, this is similar to multiple prior exams. Septal thickening on prior CT suggests an element of pulmonary edema. There is background emphysema on CT. 3. No new or acute airspace disease Electronically Signed   By: Keith Rake M.D.   On: 06/29/2020 20:31    My personal review of EKG: Ventricular paced, HR 60   Assessment & Plan:    Active Problems:   CHF exacerbation ([place])   1. CHF exacerbation 1. CXR with pulm edema 2. BNP 569 3. Cardiomegaly on CXR 4. Req 3L [place] when baseline is 2L [place] 5. Echo 01/2020 = EF 30-35% 6. Continue lasix, daily weights, Intake and output, low sodium diet, and fluid restrictions 7. No ACE/ARB 2/2 renal insufficiency 8. Continue beta blocker 2. Elevated troponin 1. Trop 28 2. Trend 3. In the setting of renal insufficiency 4. Likely demand ischemia 3. Renal insufficiency 1. CKD stage IV 2. Cr baseline is 1.4 - 1.5 3. Today 1.7 4. Cardiorenal syndrome 5. Continue lasix 6. Re-check in the AM 4. HTN 1. Continue home medications 5. Hypothyroidism 1. Continue synthroid 6. Dispo 1. Likely to return home upon discharge in 1-2 days   DVT Prophylaxis- Warfarin - SCDs   AM Labs Ordered, also please review Full Orders  Family Communication: No family at bedside  Code Status:  DNR  Admission status: Observation  Time spent in minutes : [place]

## 2020-06-29 NOTE — Patient Instructions (Signed)
Use ice x 2 days if it is sore   Use Aspercreme, Biofreeze or Voltaren gel over the counter 2-3 times daily make sure you rub it in well each time you use it.

## 2020-06-29 NOTE — Progress Notes (Signed)
PROCEDURE NOTE:  The patient requests injections of the right knee , verbal consent was obtained.  The right knee was prepped appropriately after time out was performed.   Sterile technique was observed and injection of 1 cc of Depo-Medrol 40 mg with several cc's of plain xylocaine. Anesthesia was provided by ethyl chloride and a 20-gauge needle was used to inject the knee area. The injection was tolerated well.  A band aid dressing was applied.  The patient was advised to apply ice later today and tomorrow to the injection sight as needed.  PROCEDURE NOTE:  The patient request injection, verbal consent was obtained.  The left shoulder was prepped appropriately after time out was performed.   Sterile technique was observed and injection of 1 cc of Depo-Medrol 40 mg with several cc's of plain xylocaine. Anesthesia was provided by ethyl chloride and a 20-gauge needle was used to inject the shoulder area. A posterior approach was used.  The injection was tolerated well.  A band aid dressing was applied.  The patient was advised to apply ice later today and tomorrow to the injection sight as needed.   Return in six weeks.  Electronically Signed Sanjuana Kava, MD 10/5/202110:55 AM

## 2020-06-29 NOTE — Progress Notes (Signed)
Agree. She needs to take the diuretics as prescribed. Thanks.

## 2020-06-30 ENCOUNTER — Observation Stay (HOSPITAL_COMMUNITY): Payer: Medicare Other

## 2020-06-30 ENCOUNTER — Ambulatory Visit: Payer: Medicare Other | Admitting: Family Medicine

## 2020-06-30 DIAGNOSIS — I110 Hypertensive heart disease with heart failure: Secondary | ICD-10-CM | POA: Diagnosis not present

## 2020-06-30 DIAGNOSIS — N183 Chronic kidney disease, stage 3 unspecified: Secondary | ICD-10-CM

## 2020-06-30 DIAGNOSIS — I4819 Other persistent atrial fibrillation: Secondary | ICD-10-CM | POA: Diagnosis not present

## 2020-06-30 DIAGNOSIS — J9611 Chronic respiratory failure with hypoxia: Secondary | ICD-10-CM | POA: Diagnosis not present

## 2020-06-30 DIAGNOSIS — Z886 Allergy status to analgesic agent status: Secondary | ICD-10-CM | POA: Diagnosis not present

## 2020-06-30 DIAGNOSIS — Z79890 Hormone replacement therapy (postmenopausal): Secondary | ICD-10-CM | POA: Diagnosis not present

## 2020-06-30 DIAGNOSIS — Z87891 Personal history of nicotine dependence: Secondary | ICD-10-CM | POA: Diagnosis not present

## 2020-06-30 DIAGNOSIS — D649 Anemia, unspecified: Secondary | ICD-10-CM | POA: Diagnosis not present

## 2020-06-30 DIAGNOSIS — J439 Emphysema, unspecified: Secondary | ICD-10-CM | POA: Diagnosis not present

## 2020-06-30 DIAGNOSIS — J90 Pleural effusion, not elsewhere classified: Secondary | ICD-10-CM | POA: Diagnosis not present

## 2020-06-30 DIAGNOSIS — Z79899 Other long term (current) drug therapy: Secondary | ICD-10-CM | POA: Diagnosis not present

## 2020-06-30 DIAGNOSIS — I5042 Chronic combined systolic (congestive) and diastolic (congestive) heart failure: Secondary | ICD-10-CM | POA: Diagnosis not present

## 2020-06-30 DIAGNOSIS — E039 Hypothyroidism, unspecified: Secondary | ICD-10-CM | POA: Diagnosis not present

## 2020-06-30 DIAGNOSIS — E1165 Type 2 diabetes mellitus with hyperglycemia: Secondary | ICD-10-CM | POA: Diagnosis not present

## 2020-06-30 DIAGNOSIS — I509 Heart failure, unspecified: Secondary | ICD-10-CM | POA: Diagnosis not present

## 2020-06-30 DIAGNOSIS — I4892 Unspecified atrial flutter: Secondary | ICD-10-CM | POA: Diagnosis not present

## 2020-06-30 DIAGNOSIS — J9621 Acute and chronic respiratory failure with hypoxia: Secondary | ICD-10-CM | POA: Diagnosis not present

## 2020-06-30 DIAGNOSIS — N289 Disorder of kidney and ureter, unspecified: Secondary | ICD-10-CM | POA: Diagnosis not present

## 2020-06-30 DIAGNOSIS — Z7901 Long term (current) use of anticoagulants: Secondary | ICD-10-CM | POA: Diagnosis not present

## 2020-06-30 DIAGNOSIS — J441 Chronic obstructive pulmonary disease with (acute) exacerbation: Secondary | ICD-10-CM | POA: Diagnosis not present

## 2020-06-30 DIAGNOSIS — Z66 Do not resuscitate: Secondary | ICD-10-CM | POA: Diagnosis not present

## 2020-06-30 DIAGNOSIS — Z7189 Other specified counseling: Secondary | ICD-10-CM | POA: Diagnosis not present

## 2020-06-30 DIAGNOSIS — E872 Acidosis: Secondary | ICD-10-CM | POA: Diagnosis not present

## 2020-06-30 DIAGNOSIS — Z885 Allergy status to narcotic agent status: Secondary | ICD-10-CM | POA: Diagnosis not present

## 2020-06-30 DIAGNOSIS — Z800 Family history of malignant neoplasm of digestive organs: Secondary | ICD-10-CM | POA: Diagnosis not present

## 2020-06-30 DIAGNOSIS — J9811 Atelectasis: Secondary | ICD-10-CM | POA: Diagnosis not present

## 2020-06-30 DIAGNOSIS — I10 Essential (primary) hypertension: Secondary | ICD-10-CM | POA: Diagnosis not present

## 2020-06-30 DIAGNOSIS — R109 Unspecified abdominal pain: Secondary | ICD-10-CM | POA: Diagnosis not present

## 2020-06-30 DIAGNOSIS — Z7951 Long term (current) use of inhaled steroids: Secondary | ICD-10-CM | POA: Diagnosis not present

## 2020-06-30 DIAGNOSIS — Z85850 Personal history of malignant neoplasm of thyroid: Secondary | ICD-10-CM | POA: Diagnosis not present

## 2020-06-30 DIAGNOSIS — J449 Chronic obstructive pulmonary disease, unspecified: Secondary | ICD-10-CM | POA: Diagnosis present

## 2020-06-30 DIAGNOSIS — I130 Hypertensive heart and chronic kidney disease with heart failure and stage 1 through stage 4 chronic kidney disease, or unspecified chronic kidney disease: Secondary | ICD-10-CM | POA: Diagnosis not present

## 2020-06-30 DIAGNOSIS — I442 Atrioventricular block, complete: Secondary | ICD-10-CM | POA: Diagnosis not present

## 2020-06-30 DIAGNOSIS — I5023 Acute on chronic systolic (congestive) heart failure: Secondary | ICD-10-CM | POA: Diagnosis not present

## 2020-06-30 DIAGNOSIS — Z20822 Contact with and (suspected) exposure to covid-19: Secondary | ICD-10-CM | POA: Diagnosis not present

## 2020-06-30 DIAGNOSIS — N184 Chronic kidney disease, stage 4 (severe): Secondary | ICD-10-CM | POA: Diagnosis not present

## 2020-06-30 DIAGNOSIS — R4182 Altered mental status, unspecified: Secondary | ICD-10-CM | POA: Diagnosis not present

## 2020-06-30 DIAGNOSIS — G9340 Encephalopathy, unspecified: Secondary | ICD-10-CM | POA: Diagnosis not present

## 2020-06-30 DIAGNOSIS — I5033 Acute on chronic diastolic (congestive) heart failure: Secondary | ICD-10-CM | POA: Diagnosis not present

## 2020-06-30 DIAGNOSIS — Z515 Encounter for palliative care: Secondary | ICD-10-CM | POA: Diagnosis not present

## 2020-06-30 DIAGNOSIS — J189 Pneumonia, unspecified organism: Secondary | ICD-10-CM | POA: Diagnosis not present

## 2020-06-30 DIAGNOSIS — I5043 Acute on chronic combined systolic (congestive) and diastolic (congestive) heart failure: Secondary | ICD-10-CM | POA: Diagnosis not present

## 2020-06-30 DIAGNOSIS — E11649 Type 2 diabetes mellitus with hypoglycemia without coma: Secondary | ICD-10-CM | POA: Diagnosis not present

## 2020-06-30 DIAGNOSIS — Z86010 Personal history of colonic polyps: Secondary | ICD-10-CM | POA: Diagnosis not present

## 2020-06-30 DIAGNOSIS — I517 Cardiomegaly: Secondary | ICD-10-CM | POA: Diagnosis not present

## 2020-06-30 DIAGNOSIS — J42 Unspecified chronic bronchitis: Secondary | ICD-10-CM | POA: Diagnosis not present

## 2020-06-30 DIAGNOSIS — I1310 Hypertensive heart and chronic kidney disease without heart failure, with stage 1 through stage 4 chronic kidney disease, or unspecified chronic kidney disease: Secondary | ICD-10-CM | POA: Diagnosis not present

## 2020-06-30 DIAGNOSIS — Z8616 Personal history of COVID-19: Secondary | ICD-10-CM | POA: Diagnosis not present

## 2020-06-30 DIAGNOSIS — R748 Abnormal levels of other serum enzymes: Secondary | ICD-10-CM | POA: Diagnosis not present

## 2020-06-30 DIAGNOSIS — R569 Unspecified convulsions: Secondary | ICD-10-CM | POA: Diagnosis not present

## 2020-06-30 DIAGNOSIS — Z880 Allergy status to penicillin: Secondary | ICD-10-CM | POA: Diagnosis not present

## 2020-06-30 DIAGNOSIS — I248 Other forms of acute ischemic heart disease: Secondary | ICD-10-CM | POA: Diagnosis not present

## 2020-06-30 DIAGNOSIS — R0602 Shortness of breath: Secondary | ICD-10-CM | POA: Diagnosis not present

## 2020-06-30 DIAGNOSIS — Z952 Presence of prosthetic heart valve: Secondary | ICD-10-CM | POA: Diagnosis not present

## 2020-06-30 DIAGNOSIS — I950 Idiopathic hypotension: Secondary | ICD-10-CM | POA: Diagnosis not present

## 2020-06-30 DIAGNOSIS — Z452 Encounter for adjustment and management of vascular access device: Secondary | ICD-10-CM | POA: Diagnosis not present

## 2020-06-30 LAB — TROPONIN I (HIGH SENSITIVITY): Troponin I (High Sensitivity): 30 ng/L — ABNORMAL HIGH (ref <18)

## 2020-06-30 LAB — CBC WITH DIFFERENTIAL/PLATELET
Abs Immature Granulocytes: 0.03 10*3/uL (ref 0.00–0.07)
Basophils Absolute: 0 10*3/uL (ref 0.0–0.1)
Basophils Relative: 0 %
Eosinophils Absolute: 0 10*3/uL (ref 0.0–0.5)
Eosinophils Relative: 0 %
HCT: 29.6 % — ABNORMAL LOW (ref 36.0–46.0)
Hemoglobin: 9 g/dL — ABNORMAL LOW (ref 12.0–15.0)
Immature Granulocytes: 1 %
Lymphocytes Relative: 10 %
Lymphs Abs: 0.6 10*3/uL — ABNORMAL LOW (ref 0.7–4.0)
MCH: 29 pg (ref 26.0–34.0)
MCHC: 30.4 g/dL (ref 30.0–36.0)
MCV: 95.5 fL (ref 80.0–100.0)
Monocytes Absolute: 0.4 10*3/uL (ref 0.1–1.0)
Monocytes Relative: 6 %
Neutro Abs: 5.4 10*3/uL (ref 1.7–7.7)
Neutrophils Relative %: 83 %
Platelets: 197 10*3/uL (ref 150–400)
RBC: 3.1 MIL/uL — ABNORMAL LOW (ref 3.87–5.11)
RDW: 17.2 % — ABNORMAL HIGH (ref 11.5–15.5)
WBC: 6.5 10*3/uL (ref 4.0–10.5)
nRBC: 0 % (ref 0.0–0.2)

## 2020-06-30 LAB — COMPREHENSIVE METABOLIC PANEL
ALT: 16 U/L (ref 0–44)
AST: 23 U/L (ref 15–41)
Albumin: 3.3 g/dL — ABNORMAL LOW (ref 3.5–5.0)
Alkaline Phosphatase: 53 U/L (ref 38–126)
Anion gap: 9 (ref 5–15)
BUN: 57 mg/dL — ABNORMAL HIGH (ref 8–23)
CO2: 34 mmol/L — ABNORMAL HIGH (ref 22–32)
Calcium: 9.1 mg/dL (ref 8.9–10.3)
Chloride: 94 mmol/L — ABNORMAL LOW (ref 98–111)
Creatinine, Ser: 1.73 mg/dL — ABNORMAL HIGH (ref 0.44–1.00)
GFR calc non Af Amer: 26 mL/min — ABNORMAL LOW (ref >60)
Glucose, Bld: 105 mg/dL — ABNORMAL HIGH (ref 70–99)
Potassium: 4.6 mmol/L (ref 3.5–5.1)
Sodium: 137 mmol/L (ref 135–145)
Total Bilirubin: 0.6 mg/dL (ref 0.3–1.2)
Total Protein: 7.4 g/dL (ref 6.5–8.1)

## 2020-06-30 LAB — MAGNESIUM: Magnesium: 2.1 mg/dL (ref 1.7–2.4)

## 2020-06-30 LAB — PROTIME-INR
INR: 2.9 — ABNORMAL HIGH (ref 0.8–1.2)
Prothrombin Time: 29.2 seconds — ABNORMAL HIGH (ref 11.4–15.2)

## 2020-06-30 MED ORDER — FUROSEMIDE 10 MG/ML IJ SOLN
INTRAMUSCULAR | Status: AC
  Administered 2020-07-01: 60 mg via INTRAVENOUS
  Filled 2020-06-30: qty 4

## 2020-06-30 MED ORDER — ALBUTEROL SULFATE (2.5 MG/3ML) 0.083% IN NEBU: 2.5000 mg | INHALATION_SOLUTION | RESPIRATORY_TRACT | Status: DC | PRN

## 2020-06-30 MED ORDER — WARFARIN SODIUM 5 MG PO TABS: 5.0000 mg | ORAL_TABLET | ORAL | Status: DC

## 2020-06-30 MED ORDER — FOLIC ACID 1 MG PO TABS
1.0000 mg | ORAL_TABLET | Freq: Every day | ORAL | Status: DC
  Administered 2020-06-30 – 2020-07-07 (×8): 1 mg via ORAL
  Filled 2020-06-30 (×9): qty 1

## 2020-06-30 MED ORDER — LEVOTHYROXINE SODIUM 75 MCG PO TABS
75.0000 ug | ORAL_TABLET | Freq: Every day | ORAL | Status: DC
  Administered 2020-06-30 – 2020-07-07 (×8): 75 ug via ORAL
  Filled 2020-06-30 (×3): qty 1
  Filled 2020-06-30: qty 2
  Filled 2020-06-30 (×5): qty 1

## 2020-06-30 MED ORDER — PRAVASTATIN SODIUM 40 MG PO TABS
40.0000 mg | ORAL_TABLET | Freq: Every day | ORAL | Status: DC
  Administered 2020-06-30 – 2020-07-08 (×9): 40 mg via ORAL
  Filled 2020-06-30 (×11): qty 1

## 2020-06-30 MED ORDER — FLUTICASONE FUROATE-VILANTEROL 100-25 MCG/INH IN AEPB
1.0000 | INHALATION_SPRAY | Freq: Every day | RESPIRATORY_TRACT | Status: DC
  Administered 2020-06-30 – 2020-07-06 (×7): 1 via RESPIRATORY_TRACT
  Filled 2020-06-30 (×2): qty 28

## 2020-06-30 MED ORDER — FUROSEMIDE 10 MG/ML IJ SOLN
60.0000 mg | Freq: Two times a day (BID) | INTRAMUSCULAR | Status: DC
  Administered 2020-06-30 – 2020-07-02 (×4): 60 mg via INTRAVENOUS
  Filled 2020-06-30 (×5): qty 6

## 2020-06-30 MED ORDER — ACETAMINOPHEN 325 MG PO TABS: 650.0000 mg | ORAL_TABLET | Freq: Once | ORAL | Status: DC

## 2020-06-30 MED ORDER — HYDRALAZINE HCL 25 MG PO TABS
25.0000 mg | ORAL_TABLET | Freq: Two times a day (BID) | ORAL | Status: DC
  Administered 2020-06-30 – 2020-07-05 (×9): 25 mg via ORAL
  Filled 2020-06-30 (×11): qty 1

## 2020-06-30 MED ORDER — ISOSORBIDE MONONITRATE ER 30 MG PO TB24
15.0000 mg | ORAL_TABLET | Freq: Every morning | ORAL | Status: DC
  Administered 2020-06-30 – 2020-07-05 (×6): 15 mg via ORAL
  Filled 2020-06-30 (×8): qty 1

## 2020-06-30 MED ORDER — WARFARIN SODIUM 5 MG PO TABS
10.0000 mg | ORAL_TABLET | ORAL | Status: DC
  Administered 2020-06-30 – 2020-07-01 (×2): 10 mg via ORAL
  Filled 2020-06-30 (×2): qty 2

## 2020-06-30 MED ORDER — SODIUM CHLORIDE 0.9% FLUSH: 3.0000 mL | INTRAVENOUS | Status: DC | PRN

## 2020-06-30 MED ORDER — POLYETHYLENE GLYCOL 3350 17 GM/SCOOP PO POWD
17.0000 g | Freq: Every day | ORAL | Status: DC | PRN
  Filled 2020-06-30: qty 255

## 2020-06-30 MED ORDER — SODIUM CHLORIDE 0.9 % IV SOLN: 250.0000 mL | INTRAVENOUS | Status: DC | PRN

## 2020-06-30 MED ORDER — FLUTICASONE PROPIONATE 50 MCG/ACT NA SUSP
1.0000 | Freq: Every day | NASAL | Status: DC
  Administered 2020-06-30 – 2020-07-08 (×9): 1 via NASAL
  Filled 2020-06-30 (×5): qty 16

## 2020-06-30 MED ORDER — ONDANSETRON HCL 4 MG/2ML IJ SOLN
4.0000 mg | Freq: Three times a day (TID) | INTRAMUSCULAR | Status: DC | PRN
  Administered 2020-07-01 – 2020-07-08 (×3): 4 mg via INTRAVENOUS
  Filled 2020-06-30 (×3): qty 2

## 2020-06-30 MED ORDER — WARFARIN - PHARMACIST DOSING INPATIENT: Freq: Every day | Status: DC

## 2020-06-30 MED ORDER — ACETAMINOPHEN 325 MG PO TABS
650.0000 mg | ORAL_TABLET | ORAL | Status: DC | PRN
  Administered 2020-06-30 – 2020-07-08 (×7): 650 mg via ORAL
  Filled 2020-06-30 (×7): qty 2

## 2020-06-30 MED ORDER — ACETAMINOPHEN 325 MG PO TABS: 650.0000 mg | ORAL_TABLET | Freq: Four times a day (QID) | ORAL | Status: DC | PRN

## 2020-06-30 MED ORDER — SODIUM CHLORIDE 0.9% FLUSH
3.0000 mL | Freq: Two times a day (BID) | INTRAVENOUS | Status: DC
  Administered 2020-06-30 – 2020-07-08 (×14): 3 mL via INTRAVENOUS

## 2020-06-30 MED ORDER — METOPROLOL SUCCINATE ER 50 MG PO TB24
100.0000 mg | ORAL_TABLET | Freq: Every day | ORAL | Status: DC
  Administered 2020-06-30 – 2020-07-08 (×7): 100 mg via ORAL
  Filled 2020-06-30: qty 2
  Filled 2020-06-30: qty 4
  Filled 2020-06-30 (×7): qty 2

## 2020-06-30 NOTE — TOC Initial Note (Signed)
Transition of Care [facility]) - Initial/Assessment Note   Patient Details  Name: [name] [name] MRN: [id number] Date of Birth: February 23, 1932  Transition of [facility]) CM/SW Contact:    Sherie Don, LCSW Phone Number: 06/30/2020, 11:00 AM  Clinical Narrative: Patient is an 86 year old female who is under observation for CHF exacerbation. Patient has history of hypothyroidism, DM, cardiomyopathy, GERD, CKD, aortic valve repair, and chronic respiratory failure. TOC received consult for CHF screening. CSW met with patient in ED to complete assessment. Per patient, she resides at home with her nephew. Patient's current DME includes a walker, shower chair, and O2 (2L/min). Patient has a history of HH with [facility] and is agreeable to resuming [place] services with [facility]. Patient reported she requires some assistance with her ADLs and is able to afford her medications each month, which she takes as prescribed.  Per CHF screening, patient stated she "more or less" follows a heart healthy diet, which includes restricting her salt intake. Patient reported she limits her fluid intake daily and completes daily weights, but did not monitor her weight in the few days leading up to coming to the hospital. TOC to follow for discharge needs.    Expected Discharge Plan: [place] Barriers to Discharge: Continued Medical Work up  Patient Goals and CMS Choice Patient states their goals for this hospitalization and ongoing recovery are:: Discharge home with [facility] CMS Medicare.gov Compare Post Acute Care list provided to:: Patient Choice offered to / list presented to : Patient  Expected Discharge Plan and Services Expected Discharge Plan: [place] In-house Referral: Clinical Social Work Discharge Planning Services: NA Post Acute Care Choice: Home Health Living arrangements for the past 2 months: [place]: [place] ([place]) Date [place]:  06/30/20 Time [place]: [id number] Representative spoke with at [place]: Vaughan Basta  Prior Living Arrangements/Services Living arrangements for the past 2 months: [place] Lives with:: Relatives Patient language and need for interpreter reviewed:: Yes Do you feel safe going back to the place where you live?: Yes      Need for Family Participation in Patient Care: No (Comment) Care giver support system in place?: Yes (comment) Current home services: DME (O2, walker, shower chair) Criminal Activity/Legal Involvement Pertinent to Current Situation/Hospitalization: No - Comment as needed  Permission Sought/Granted Permission sought to share information with : Facility Sport and exercise psychologist, Family Supports Permission granted to share information with : Yes, Verbal Permission Granted Permission granted to share info w AGENCY: [facility]  Emotional Assessment Appearance:: Appears stated age Attitude/Demeanor/Rapport: Engaged Affect (typically observed): Accepting Orientation: : Oriented to Self, Oriented to Place, Oriented to  Time, Oriented to Situation Alcohol / Substance Use: Not Applicable Psych Involvement: No (comment)  Admission diagnosis:  CHF exacerbation ([place]) [I50.9] Patient Active Problem List   Diagnosis Date Noted  . CHF exacerbation ([place]) 06/29/2020  . Arthritis of right knee 06/20/2020  . Leg swelling 06/16/2020  . Generalized osteoarthritis 05/09/2020  . Primary osteoarthritis, left shoulder 04/21/2020  . Acute systolic CHF (congestive heart failure) ([place]) 03/31/2020  . Bilateral lower extremity edema   . Acute on chronic congestive heart failure ([place]) 03/30/2020  . Right-sided epistaxis 03/28/2020  . Acute pain of left shoulder 01/15/2020  . AAA (abdominal aortic aneurysm) without rupture ([place]) 01/14/2020  . COVID-19 virus detected 11/23/2019  . Stage 3b chronic kidney disease ([place]) 08/04/2019  . Dyspnea 05/30/2019  . Anemia 05/30/2019  . Gout   .  Glaucoma   . GERD (gastroesophageal reflux disease)   . Physical debility 04/28/2019  . Encounter for examination following treatment at hospital 11/03/2018  . Hyponatremia 09/27/2018  . H/O mitral valve replacement with mechanical valve 08/02/2018  . Chronic right hip pain 04/16/2018  . Chronic systolic congestive heart failure ([place]) 11/15/2017  . Trochanteric bursitis, right hip 03/07/2017  . Persistent atrial fibrillation ([place])   . Nocturnal hypoxia 10/08/2016  . Dependence on nocturnal oxygen therapy 10/08/2016  . Hematuria 03/20/2016  . S/P ICD (internal cardiac defibrillator) procedure 02/08/2015  . Presence of cardiac pacemaker 02/08/2015  . CHB (complete heart block) ([place]) 10/07/2014  . Fatigue 07/20/2014  . Absolute anemia 06/24/2014  . Osteopenia 02/10/2014  . Encounter for therapeutic drug monitoring 10/29/2013  . OA (osteoarthritis) of knee 02/19/2013  . Chronic pain of right knee 01/02/2013  . Hemolytic anemia ([place]) 09/24/2012  . High risk medication use 05/24/2012  . COPD (chronic obstructive pulmonary disease) ([place]) 05/19/2012  . Chronic anticoagulation, on coumadin for Mechanical AVR and MVR 04/01/2012  . Cardiorenal syndrome with renal failure 03/22/2012  . Hx of aortic valve replacement, mechanical 03/22/2012  . S/P MVR (mitral valve replacement) 03/22/2012  . Biventricular automatic implantable cardioverter defibrillator in situ 03/22/2012  . CKD (chronic kidney disease), stage IV ([place]) 03/22/2012  . Warfarin-induced coagulopathy ([place]) 03/21/2012  . Iron deficiency 10/04/2011  . Allergic rhinitis 02/14/2011  . Hypothyroidism 07/15/2010  . Mitral valve disease 07/15/2010  . Sinus node dysfunction ([place]) 07/15/2010  . VARICOSE VEINS LOWER EXTREMITIES W/OTH COMPS 02/09/2009  . Prediabetes 06/15/2008  . Hypothyroidism, postsurgical 02/24/2008  . Hyperlipidemia LDL goal <70 02/24/2008  . Essential hypertension 02/24/2008  . Non-ischemic cardiomyopathy with ICD  02/24/2008  . Spondylosis 02/24/2008   PCP:  Fayrene Helper, MD Pharmacy:   [facility] [place], [place] AT [place] [address] [place] [address] Phone: [phone] Fax: [phone]  [facility] [place], [place] to [address] [place] [address] Phone: [phone] Fax: [phone]  Readmission Risk Interventions Readmission Risk Prevention Plan 04/01/2020 04/01/2020 02/03/2020  Transportation Screening Complete Complete Complete  PCP or Specialist Appt within 3-5 Days - - -  HRI or [place] for [place] - - -  Medication Review (RN Care Manager) Complete - Referral to Pharmacy  PCP or Specialist appointment within 3-5 days of discharge - - Complete  HRI or Home Care Consult Complete Complete Complete  SW Recovery Care/Counseling Consult Complete - Complete  Palliative Care Screening Not Applicable Not Applicable Not [place] Not Applicable Not Applicable Not Applicable  Some recent data might be hidden

## 2020-06-30 NOTE — Progress Notes (Addendum)
PROGRESS NOTE    [name] [name]  XBW:[id number] DOB: July 28, 1932 DOA: 06/29/2020 PCP: Fayrene Helper, MD   Brief Narrative:  86 years old female with history of hypothyroidism,  type 2 diabetes, nonischemic cardiomyopathy, hyperlipidemia, GERD, CKD stage III, aortic valve repair, CHB, s.p PPM, Persistent Atrial fibrillation , Chronic respiratory failure presented in the ED with worsening shortness of breath.  Patient reported doubling her dose of Lasix with increased urine output but had worsening shortness of breath.  Denies any chest pain. Patient is admitted for acute on chronic systolic CHF exacerbation.  Assessment & Plan:   Active Problems:   CHF exacerbation ([place])  1. Acute on chronic combined systolic and diastolic CHF exacerbation:  Presents with orthopnea, weight gain and worsening SOB, pedal edema.  CXR consistent with with pulm edema,  BNP 569, Cardiomegaly on CXR.  Requiring 3L [place] when baseline is 2L [place]  Recent Echo 01/2020 = EF 30-35%.  Continue lasix, daily weights, Intake and output, low sodium diet, and fluid restrictions  No ACE/ARB 2/2 renal insufficiency.  Continue beta blocker. Continue Hydralazine and Imdur,  Cardiology consulted, recommended continue IV diuresis.  2. Elevated troponin Trop 28 - 31 In the setting of renal insufficiency Likely demand ischemia. She denies any chest pain, palpitations, dizzziness.  3. Renal insufficiency CKD stage IV Cr baseline is 1.4 - 1.5. Today 1.73 Could  Be Cardiorenal syndrome. Continue lasix 60 mg iv bid. Re-check in the AM  4. HTN: Continue home medications.  5. Hypothyroidism: Continue synthroid.   6.: Persistent Atrial fibrillation: Heart rate remains controlled, denies any palpitations. continue Toprol-XL 100 mg daily, continue Coumadin for anticoagulation.  7. CHB, S/P PPM: Recently interrogated in August 2081 shows normal device function.  8.  Valvular heart disease: S/p mechanical AVR in 2001 and  mechanical MVR in 2004 Recent echo shows no significant valve abnormalities continue Coumadin.  9. COPD: not in exacerbation. Continue supplemental oxygen @ 2L/ m  DVT prophylaxis: Coumadin Code Status: DNR Family Communication: No one at bed side. Disposition Plan:   Status is: Inpatient  Remains inpatient appropriate because:Inpatient level of care appropriate due to severity of illness   Dispo: The patient is from: Home              Anticipated d/c is to: SNF              Anticipated d/c date is: 3 days              Patient currently is not medically stable to d/c.    Consultants:   Cardiology  Procedures: None.  Antimicrobials:  Anti-infectives (From admission, onward)   None     Subjective: Patient is seen and examined at bedside.  Overnight events noted.  She denies any chest pain, palpitation, shortness of breath.  She still has significant pedal edema.   Objective: Vitals:   06/30/20 1300 06/30/20 1309 06/30/20 1330 06/30/20 1400  BP: (!) 151/69  (!) 132/57 (!) 144/56  Pulse: (!) 58 (!) 59 (!) 59 (!) 59  Resp: 19 (!) 23 18 19   Temp:      TempSrc:      SpO2: 92% 98% 97% 99%  Weight:      Height:        Intake/Output Summary (Last 24 hours) at 06/30/2020 1441 Last data filed at 06/30/2020 1412 Gross per 24 hour  Intake 3 ml  Output 1950 ml  Net -1947 ml   Filed Weights   06/29/20 1947  Weight: 93 kg    Examination:  General exam: Appears calm and comfortable  Respiratory system: Basilar crackles on auscultation. Respiratory effort normal. Cardiovascular system: S1 & S2 heard, RRR. No JVD, murmurs, rubs, gallops or clicks. No pedal edema. Gastrointestinal system: Abdomen is nondistended, soft and nontender. No organomegaly or masses felt. Normal bowel sounds heard. Central nervous system: Alert and oriented. No focal neurological deficits. Extremities: pedal edema++, no cyanosis, no clubbing Skin: No rashes, lesions or ulcers Psychiatry:  Judgement and insight appear normal. Mood & affect appropriate.     Data Reviewed: I have personally reviewed following labs and imaging studies  CBC: Recent Labs  Lab 06/29/20 2002 06/30/20 0500  WBC 7.8 6.5  NEUTROABS 6.9 5.4  HGB 9.9* 9.0*  HCT 32.0* 29.6*  MCV 94.4 95.5  PLT 210 564   Basic Metabolic Panel: Recent Labs  Lab 06/29/20 2002 06/30/20 0500  NA 134* 137  K 5.2* 4.6  CL 96* 94*  CO2 28 34*  GLUCOSE 116* 105*  BUN 59* 57*  CREATININE 1.70* 1.73*  CALCIUM 9.1 9.1  MG  --  2.1   GFR: Estimated Creatinine Clearance: 27.3 mL/min (A) (by C-G formula based on SCr of 1.73 mg/dL (H)). Liver Function Tests: Recent Labs  Lab 06/29/20 2002 06/30/20 0500  AST 34 23  ALT 18 16  ALKPHOS 61 53  BILITOT 0.4 0.6  PROT 7.9 7.4  ALBUMIN 3.8 3.3*   No results for input(s): LIPASE, AMYLASE in the last 168 hours. No results for input(s): AMMONIA in the last 168 hours. Coagulation Profile: Recent Labs  Lab 06/30/20 0500  INR 2.9*   Cardiac Enzymes: No results for input(s): CKTOTAL, CKMB, CKMBINDEX, TROPONINI in the last 168 hours. BNP (last 3 results) No results for input(s): PROBNP in the last 8760 hours. HbA1C: No results for input(s): HGBA1C in the last 72 hours. CBG: No results for input(s): GLUCAP in the last 168 hours. Lipid Profile: No results for input(s): CHOL, HDL, LDLCALC, TRIG, CHOLHDL, LDLDIRECT in the last 72 hours. Thyroid Function Tests: No results for input(s): TSH, T4TOTAL, FREET4, T3FREE, THYROIDAB in the last 72 hours. Anemia Panel: No results for input(s): VITAMINB12, FOLATE, FERRITIN, TIBC, IRON, RETICCTPCT in the last 72 hours. Sepsis Labs: No results for input(s): PROCALCITON, LATICACIDVEN in the last 168 hours.  Recent Results (from the past 240 hour(s))  Respiratory Panel by RT PCR (Flu A&B, Covid) - Nasopharyngeal Swab     Status: None   Collection Time: 06/29/20  7:57 PM   Specimen: Nasopharyngeal Swab  Result Value Ref  Range Status   SARS Coronavirus 2 by RT PCR NEGATIVE NEGATIVE Final    Comment: (NOTE) SARS-CoV-2 target nucleic acids are NOT DETECTED.  The SARS-CoV-2 RNA is generally detectable in upper respiratoy specimens during the acute phase of infection. The lowest concentration of SARS-CoV-2 viral copies this assay can detect is 131 copies/mL. A negative result does not preclude SARS-Cov-2 infection and should not be used as the sole basis for treatment or other patient management decisions. A negative result may occur with  improper specimen collection/handling, submission of specimen other than nasopharyngeal swab, presence of viral mutation(s) within the areas targeted by this assay, and inadequate number of viral copies (<131 copies/mL). A negative result must be combined with clinical observations, patient history, and epidemiological information. The expected result is Negative.  Fact Sheet for Patients:  PinkCheek.be  Fact Sheet for Healthcare Providers:  GravelBags.it  This test is no t yet approved or  cleared by the [place] and  has been authorized for detection and/or diagnosis of SARS-CoV-2 by FDA under an Emergency Use Authorization (EUA). This EUA will remain  in effect (meaning this test can be used) for the duration of the COVID-19 declaration under Section 564(b)(1) of the Act, 21 U.S.C. section 360bbb-3(b)(1), unless the authorization is terminated or revoked sooner.     Influenza A by PCR NEGATIVE NEGATIVE Final   Influenza B by PCR NEGATIVE NEGATIVE Final    Comment: (NOTE) The Xpert Xpress SARS-CoV-2/FLU/RSV assay is intended as an aid in  the diagnosis of influenza from Nasopharyngeal swab specimens and  should not be used as a sole basis for treatment. Nasal washings and  aspirates are unacceptable for Xpert Xpress SARS-CoV-2/FLU/RSV  testing.  Fact Sheet for  Patients: PinkCheek.be  Fact Sheet for Healthcare Providers: GravelBags.it  This test is not yet approved or cleared by the [place] FDA and  has been authorized for detection and/or diagnosis of SARS-CoV-2 by  FDA under an Emergency Use Authorization (EUA). This EUA will remain  in effect (meaning this test can be used) for the duration of the  Covid-19 declaration under Section 564(b)(1) of the Act, 21  U.S.C. section 360bbb-3(b)(1), unless the authorization is  terminated or revoked. Performed at [facility], [address], [place], [place] [address]      Radiology Studies: [facility] 1 View  Result Date: 06/30/2020 CLINICAL DATA:  Congestive heart failure exacerbation. EXAM: PORTABLE CHEST 1 VIEW COMPARISON:  06/29/2020 FINDINGS: Heart is enlarged. Valve replacements are noted. Pacing for related wires are stable. Atherosclerotic calcifications are noted at the aorta. Diffuse interstitial prominence is stable mostly chronic. Effusions are present. No airspace consolidation is present. Degenerative changes are noted at the left shoulder. IMPRESSION: 1. Cardiomegaly without failure. 2. Stable chronic interstitial prominence compatible with edema. 3. Small bilateral pleural effusions are stable. Electronically Signed   By: San Morelle M.D.   On: 06/30/2020 06:11   DG Chest Port 1 View  Result Date: 06/29/2020 CLINICAL DATA:  Shortness of breath.  Pulmonary edema. EXAM: PORTABLE CHEST 1 VIEW COMPARISON:  Radiograph 06/25/2020, additional priors. CT 05/27/2020. FINDINGS: Stable cardiomegaly. Unchanged mediastinal contours. Aortic atherosclerosis. Pacemaker in place with prosthetic cardiac valves. Similar appearance of interstitial coarsening from prior exam. No progression. No focal airspace disease. No pleural fluid. No pneumothorax. Stable osseous structures. IMPRESSION: 1. Stable cardiomegaly. 2. Unchanged  interstitial coarsening, this is similar to multiple prior exams. Septal thickening on prior CT suggests an element of pulmonary edema. There is background emphysema on CT. 3. No new or acute airspace disease Electronically Signed   By: Keith Rake M.D.   On: 06/29/2020 20:31    Scheduled Meds: . acetaminophen  650 mg Oral Once  . fluticasone  1 spray Each Nare Daily  . fluticasone furoate-vilanterol  1 puff Inhalation Daily  . folic acid  1 mg Oral Daily  . furosemide      . furosemide  60 mg Intravenous Q12H  . hydrALAZINE  25 mg Oral BID  . isosorbide mononitrate  15 mg Oral q morning - 10a  . levothyroxine  75 mcg Oral QAC breakfast  . metoprolol succinate  100 mg Oral Daily  . pravastatin  40 mg Oral Daily  . sodium chloride flush  3 mL Intravenous Q12H  . warfarin  10 mg Oral Once per day on Sun Mon Wed Thu Sat  . [START ON 07/06/2020] warfarin  5 mg  Oral Once per day on Tue  . Warfarin - Pharmacist Dosing Inpatient   Does not apply q1600   Continuous Infusions: . sodium chloride       LOS: 0 days    Time spent: 35 mins.    Shawna Clamp, MD [facility]   If 7PM-7AM, please contact night-coverage

## 2020-06-30 NOTE — Consult Note (Addendum)
Cardiology Consult    Patient ID: [name] [name]; [id number]; 1931-12-14   Admit date: 06/29/2020 Date of Consult: 06/30/2020  Primary Care Provider: Fayrene Helper, MD Primary Cardiologist: [name] Klein, MD   Patient Profile    [name] [name] is a 86 y.o. female with past medical history of CHB (s/p Medtronic BiV PPM in 01/2019), persistent atrial fibrillation/flutter, chronic combined systolic and diastolic CHF (EF previously as low as 20-25%, normalized by repeat echo in 2019 and 02/2019, EF 30-35% by echocardiogram in 01/2020), mechanical AVR in 2001 and mechanical MVR in 2004, HTN, HLD, Type 2 DM, COPD and Stage 3 CKD who is being seen today for the evaluation of CHF at the request of Dr. Dwyane Dee.   History of Present Illness    [name] [name] was hospitalized for an acute CHF exacerbation in 03/2020 and responded well to IV Lasix with weight having declined to 191 lbs at the time of discharge. Her prior to admission Lasix was increased to 60 mg daily and she was continued on Imdur 15mg  daily, Hydralazine 25mg  BID, once weekly Metolazone 2.5 mg, and Toprol-XL 100 mg daily.  She was examined by Dr. Sallyanne Kuster in 04/2020 and weight was stable at 193 lbs. No changes were made to her medication regimen. She did suffer a mechanical fall in 05/2020 and fractured her left fourth rib.  She presented to [facility] ED on 06/29/2020 for evaluation of worsening dyspnea over the past several days and she had increased her oxygen from 2 L to 3 L. She also reported worsening lower extremity edema and had increased her Lasix to 40 mg twice daily without improvement in her symptoms. Says she was previously taking Lasix 40mg  daily instead of 60mg  daily due to frequent urination. After she increased to 40mg  BID, she did not take her Metolazone for the past 2 weeks. She denies any chest pain or palpitations. No recent orthopnea or PND. She does not add salt to her food but reports family members and friends  bring food to her house and she is unsure if salt is already added.  Initial labs show WBC 7.8, Hgb 9.9, platelets 210, Na+ 134, K+ 5.2 and creatinine 1.70 (previously 1.50 in 05/2020). AST 34, ALT 18. COVID negative. BNP 569. Initial HS Troponin 28 with repeat of 30. CXR shows stable cardiomegaly and interstitial coarsening with no acute abnormalities. EKG shows V paced rhythm, heart rate 60.  She received IV Lasix 60 mg upon admission has been started on 60 mg twice daily.  Recorded output of 800 mL thus far with additional 900 mL in her urine canister. Repeat CXR this morning shows pulmonary edema and small bilateral effusions. Repeat metabolic panel shows K+ has improved to 4.6 and creatinine remains stable at 1.73.   Past Medical History:  Diagnosis Date  . Acute on chronic diastolic CHF (congestive heart failure) ([place]) 05/19/2012  . Acute on chronic respiratory failure with hypoxia ([place]) 01/31/2020  . Adenomatous polyp of colon 12/16/2002   Dr. [name]/[name]  . Allergy   . Calculus of gallbladder with chronic cholecystitis without obstruction 02/09/2009   Qualifier: Diagnosis of  By: [name], [name]    . Cataract   . CHB (complete heart block) ([place]) 10/07/2014      . CHF (congestive heart failure) ([place])    a.  EF previously reduced to 25-30% b. EF improved to 60-65% by echo in 10/2017.   Marland Kitchen Chronic kidney disease, stage I 2006  .  Constipation       . COPD (chronic obstructive pulmonary disease) ([place])       . DJD (degenerative joint disease) of lumbar spine   . DM (diabetes mellitus) ([place]) 2006  . Esophageal reflux   . Glaucoma   . Gout       . H/O adenomatous polyp of colon 05/24/2012   2004 in [place] colonoscopy 2008 normal    . H/O heart valve replacement with mechanical valve    a. s/p mechanical AVR in 2001 and mechanical MVR in 2004.  Marland Kitchen H/O wisdom tooth extraction   . HIP PAIN, RIGHT 04/23/2009   Qualifier: Diagnosis of  By:  [name], [name]    . Hyperlipemia   . IDA (iron deficiency anemia)    Parenteral iron/[name]  . Insomnia       . Nausea without vomiting 06/07/2010   Qualifier: Diagnosis of  By: [name], [name]    . Non-ischemic cardiomyopathy ([place])    a. s/p MDT CRTD  . Obesity, unspecified   . Other and unspecified hyperlipidemia   . Oxygen deficiency 2014   nocturnal;  . Papilloma of breast 03/06/2012   This was diagnosed as a left breast papilloma by ultrasound characteristics and symptoms in 2010. Because of possible medical risk factors no surgical intervention has been done and it did doing annual followups. The area has been stable by physical examination and mammograms and ultrasound since 2010.   Marland Kitchen Spinal stenosis of lumbar region 02/19/2013  . Spondylolisthesis   . Spondylosis   . Thyroid cancer ([place])    remote thyroidectomy, no recurrence, pt denies in 2016 thyroid cancer  . Type 2 diabetes mellitus with hyperglycemia ([place]) 07/15/2010  . Unspecified hypothyroidism       . 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 Vertigo         Past Surgical History:  Procedure Laterality Date  . AORTIC AND MITRAL VALVE REPLACEMENT  2001  . BI-VENTRICULAR IMPLANTABLE CARDIOVERTER DEFIBRILLATOR UPGRADE N/A 01/18/2015   Procedure: BI-VENTRICULAR IMPLANTABLE CARDIOVERTER DEFIBRILLATOR UPGRADE;  Surgeon: Evans Lance, MD;  Location: [facility] CATH LAB;  Service: Cardiovascular;  Laterality: N/A;  . BIV ICD GENERATOR CHANGEOUT N/A 01/30/2019   Procedure: BIV ICD GENERATOR CHANGEOUT;  Surgeon: Evans Lance, MD;  Location: [place] CV LAB;  Service: Cardiovascular;  Laterality: N/A;  . CARDIAC CATHETERIZATION    . CARDIAC VALVE REPLACEMENT    . CARDIOVERSION N/A 11/13/2016   Procedure: CARDIOVERSION;  Surgeon: [name] Klein, MD;  Location: [facility] ENDOSCOPY;  Service: Cardiovascular;  Laterality: N/A;  . COLONOSCOPY  06/12/2007   Rourk- Normal rectum/Normal colon  .  COLONOSCOPY  12/16/02   small hemorrhoids  . COLONOSCOPY  06/20/2012   Procedure: COLONOSCOPY;  Surgeon: Daneil Dolin, MD;  Location: [facility] ENDO SUITE;  Service: Endoscopy;  Laterality: N/A;  10:45  . defibrillator implanted 2006    . DOPPLER ECHOCARDIOGRAPHY  2012  . DOPPLER ECHOCARDIOGRAPHY  05,06,07,08,09,2011  . ESOPHAGOGASTRODUODENOSCOPY  06/12/2007   Rourk- normal esophagus, small hiatal hernia, otherwise normal stomach, D1, D2  . EYE SURGERY Right 2005  . EYE SURGERY Left 2006  . ICD LEAD REMOVAL Left 02/08/2015   Procedure: ICD LEAD REMOVAL;  Surgeon: Evans Lance, MD;  Location: [facility] OR;  Service: Cardiovascular;  Laterality: Left;  "Will plan extraction and insertion of a BiV PM"  **Dr. Roxan Hockey backing up case**  . IMPLANTABLE CARDIOVERTER DEFIBRILLATOR (ICD) GENERATOR CHANGE Left 02/08/2015  Procedure: ICD GENERATOR CHANGE;  Surgeon: Evans Lance, MD;  Location: [place];  Service: Cardiovascular;  Laterality: Left;  . INSERT / REPLACE / REMOVE PACEMAKER    . PACEMAKER INSERTION  May 2016  . pacemaker placed  2004  . right breast cyst removed     benign   . right cataract removed     2005  . TEE WITHOUT CARDIOVERSION N/A 11/13/2016   Procedure: TRANSESOPHAGEAL ECHOCARDIOGRAM (TEE);  Surgeon: [name] Klein, MD;  Location: [facility] ENDOSCOPY;  Service: Cardiovascular;  Laterality: N/A;  . THYROIDECTOMY       Home Medications:  Prior to Admission medications   Medication Sig Start Date End Date Taking? Authorizing Provider  acetaminophen (TYLENOL) 500 MG tablet Take 500 mg by mouth every 6 (six) hours as needed (pain).    Yes [provider]  albuterol (PROVENTIL) (2.5 MG/3ML) 0.083% nebulizer solution Take 3 mLs (2.5 mg total) by nebulization in the morning and at bedtime. *May use additional one time if needed for shortness of breath 04/02/20  Yes Johnson, Clanford L, MD  albuterol (VENTOLIN HFA) 108 (90 Base) MCG/ACT inhaler Inhale 1-2 puffs into the lungs every 6 (six)  hours as needed for wheezing or shortness of breath. 04/02/20  Yes Johnson, Clanford L, MD  allopurinol (ZYLOPRIM) 100 MG tablet Take 1 tablet (100 mg total) by mouth in the morning. 04/02/20  Yes Johnson, Clanford L, MD  BREO ELLIPTA 100-25 MCG/INH AEPB 1 TAKE 1 INHALATION BY MOUTH ONCE DAILY 08/25/19  Yes [name] Helper, MD  cholecalciferol (VITAMIN D3) 25 MCG (1000 UT) tablet Take 1,000 Units by mouth in the morning.    Yes [provider]  docusate sodium (COLACE) 100 MG capsule Take 300 mg by mouth at bedtime.    Yes [provider]  fluticasone (FLONASE) 50 MCG/ACT nasal spray SHAKE LIQUID AND USE 2 SPRAYS IN EACH NOSTRIL DAILY 11/24/19  Yes [name] Helper, MD  folic acid (FOLVITE) 1 MG tablet TAKE 1 TABLET(1 MG) BY MOUTH DAILY 11/10/19  Yes [name] Helper, MD  furosemide (LASIX) 40 MG tablet Take 40 mg by mouth daily. Take 80mg  BID x3DAYS THEN BACK TO 40MG  BID   Yes [provider]  gabapentin (NEURONTIN) 300 MG capsule TAKE 1 CAPSULE(300 MG) BY MOUTH TWICE DAILY Patient taking differently: Take 300 mg by mouth daily.  06/14/20  Yes [name] Helper, MD  hydrALAZINE (APRESOLINE) 25 MG tablet Take 1 tablet (25 mg total) by mouth 2 (two) times daily. 06/22/20  Yes Croitoru, Mihai, MD  isosorbide mononitrate (IMDUR) 30 MG 24 hr tablet Take 0.5 tablets (15 mg total) by mouth every morning. 02/04/20  Yes Pokhrel, Laxman, MD  Liniments (SALONPAS PAIN RELIEF PATCH EX) Apply 1 patch topically daily as needed (pain).    Yes [provider]  meclizine (ANTIVERT) 12.5 MG tablet TAKE 1 TABLET(12.5 MG) BY MOUTH THREE TIMES DAILY AS NEEDED FOR DIZZINESS Patient taking differently: Take 12.5 mg by mouth 3 (three) times daily as needed for dizziness.  03/01/20  Yes [name] Helper, MD  metolazone (ZAROXOLYN) 2.5 MG tablet Take one tablet on Saturdays. Patient taking differently: Take 2.5 mg by mouth once a week. Take one tablet on Saturdays. 11/24/19  Yes Croitoru,  Mihai, MD  metoprolol succinate (TOPROL-XL) 100 MG 24 hr tablet TAKE 1 TABLET BY MOUTH EVERY DAY WITH FOOD OR MILK Patient taking differently: Take 100 mg by mouth daily. TAKE 1 TABLET BY MOUTH EVERY DAY WITH FOOD OR  MILK 05/05/20  Yes [name] Helper, MD  nitroGLYCERIN (NITROSTAT) 0.4 MG SL tablet Place 1 tablet (0.4 mg total) under the tongue every 5 (five) minutes as needed for chest pain. 08/03/19  Yes Rolland Porter, MD  ondansetron (ZOFRAN ODT) 4 MG disintegrating tablet Take 1 tablet (4 mg total) by mouth every 8 (eight) hours as needed for nausea or vomiting. 01/26/20  Yes Fawze, Mina A, PA-C  Polyethyl [name] (SYSTANE) 0.4-0.3 % GEL ophthalmic gel Place 1 application into the left eye at bedtime.   Yes [provider]  polyethylene glycol powder (GLYCOLAX/MIRALAX) 17 GM/SCOOP powder Take 17 g by mouth daily as needed for mild constipation or moderate constipation.   Yes [provider]  potassium chloride SA (KLOR-CON) 20 MEQ tablet Take 1 tablet (20 mEq total) by mouth daily. TAKE 40MEQ DAILY x3 DAYS THEN BACK TO 20MEQ DAILY Patient taking differently: Take 20 mEq by mouth daily.  04/23/20  Yes Cleaver, Jossie Ng, NP  pravastatin (PRAVACHOL) 40 MG tablet TAKE 1 TABLET BY MOUTH EVERY EVENING Patient taking differently: Take 40 mg by mouth daily.  11/10/19  Yes [name] Helper, MD  SYNTHROID 75 MCG tablet Take 1 tablet (75 mcg total) by mouth daily before breakfast. 04/02/20  Yes Johnson, Clanford L, MD  warfarin (JANTOVEN) 5 MG tablet Take 1-2 tablets (5-10 mg total) by mouth See admin instructions. TAKE 2 TABLETS EVERY DAY EXCEPT 1 TABLET ON TUESDAYS @ 8:30PM 04/02/20  Yes Johnson, Clanford L, MD  benzonatate (TESSALON) 100 MG capsule Take 1 capsule (100 mg total) by mouth 2 (two) times daily as needed for cough. Patient not taking: Reported on 06/29/2020 05/11/20   [name] Helper, MD    Inpatient Medications: Scheduled Meds: . fluticasone  1 spray Each Nare  Daily  . fluticasone furoate-vilanterol  1 puff Inhalation Daily  . folic acid  1 mg Oral Daily  . furosemide      . furosemide  60 mg Intravenous Q12H  . hydrALAZINE  25 mg Oral BID  . isosorbide mononitrate  15 mg Oral q morning - 10a  . levothyroxine  75 mcg Oral QAC breakfast  . metoprolol succinate  100 mg Oral Daily  . pravastatin  40 mg Oral Daily  . sodium chloride flush  3 mL Intravenous Q12H   Continuous Infusions: . sodium chloride     PRN Meds: sodium chloride, acetaminophen, albuterol, ondansetron (ZOFRAN) IV, polyethylene glycol powder, sodium chloride flush  Allergies:    Allergies  Allergen Reactions  . Penicillins Other (See Comments)    Bruise    . Aspirin Other (See Comments)    On coumadin  . Fentanyl Dermatitis    Rash with patch   . Niacin Itching    Social History:   Social History   Socioeconomic History  . Marital status: Widowed    Spouse name: Not on file  . Number of children: 0  . Years of education: Not on file  . Highest education level: Not on file  Occupational History  . Occupation: retired    Fish farm manager: RETIRED  Tobacco Use  . Smoking status: Former Smoker    Packs/day: 0.75    Years: 40.00    Pack years: 30.00    Types: Cigarettes    Quit date: 03/08/1987    Years since quitting: 33.3  . Smokeless tobacco: Never Used  Vaping Use  . Vaping Use: Never used  Substance and Sexual Activity  . Alcohol use: No  Alcohol/week: 0.0 standard drinks  . Drug use: No  . Sexual activity: Not Currently  Other Topics Concern  . Not on file  Social History Narrative   From [place] (2004)   Social Determinants of Health   Financial Resource Strain: Low Risk   . Difficulty of Paying Living Expenses: Not hard at all  Food Insecurity: No Food Insecurity  . Worried About Charity fundraiser in the Last Year: Never true  . Ran Out of Food in the Last Year: Never true  Transportation Needs: No Transportation Needs  . Lack of  Transportation (Medical): No  . Lack of Transportation (Non-Medical): No  Physical Activity: Inactive  . Days of Exercise per Week: 0 days  . Minutes of Exercise per Session: 0 min  Stress: No Stress Concern Present  . Feeling of Stress : Only a little  Social Connections: Moderately Isolated  . Frequency of Communication with Friends and Family: More than three times a week  . Frequency of Social Gatherings with Friends and Family: Twice a week  . Attends Religious Services: More than 4 times per year  . Active Member of Clubs or Organizations: No  . Attends Archivist Meetings: Never  . Marital Status: Widowed  Intimate Partner Violence:   . Fear of Current or Ex-Partner: Not on file  . Emotionally Abused: Not on file  . Physically Abused: Not on file  . Sexually Abused: Not on file     Family History:    Family History  Problem Relation Age of Onset  . Pancreatic cancer Mother   . Heart disease Father   . Heart disease Sister   . Heart attack Sister   . Heart disease Brother   . Diabetes Brother   . Heart disease Brother   . Diabetes Brother   . Hypertension Brother   . Lung cancer Brother       Review of Systems    General:  No chills, fever, night sweats or weight changes.  Cardiovascular:  No chest pain, orthopnea, palpitations, paroxysmal nocturnal dyspnea. Positive for dyspnea on exertion and edema.  Dermatological: No rash, lesions/masses Respiratory: No cough, dyspnea Urologic: No hematuria, dysuria Abdominal:   No nausea, vomiting, diarrhea, bright red blood per rectum, melena, or hematemesis Neurologic:  No visual changes, wkns, changes in mental status. All other systems reviewed and are otherwise negative except as noted above.  Physical Exam/Data    Vitals:   06/30/20 1000 06/30/20 1030 06/30/20 1100 06/30/20 1130  BP: (!) 165/69 (!) 163/67 (!) 166/67 140/61  Pulse: (!) 59 (!) 59 60 60  Resp: 17 (!) 21 (!) 21 (!) 24  Temp:        TempSrc:      SpO2: 100% 100% 100% 100%  Weight:      Height:        Intake/Output Summary (Last 24 hours) at 06/30/2020 1203 Last data filed at 06/30/2020 0931 Gross per 24 hour  Intake 3 ml  Output 800 ml  Net -797 ml   Filed Weights   06/29/20 1947  Weight: 93 kg   Body mass index is 32.11 kg/m.   General: Pleasant, elderly female appearing in NAD Psych: Normal affect. Neuro: Alert and oriented X 3. Moves all extremities spontaneously. HEENT: Normal  Neck: Supple without bruits. JVD at 8 cm. Lungs:  Resp regular and unlabored, decreased breath sounds along bases bilaterally. Heart: Irregularly irregular. Crisp mechanical valve sounds appreciated.  Abdomen: Soft, non-tender, non-distended, BS +  x 4.  Extremities: No clubbing. 2+ pitting edema, most prominent along LLE. DP/PT/Radials 2+ and equal bilaterally.   EKG:  The EKG was personally reviewed and demonstrates: V paced rhythm, heart rate 60.   Labs/Studies     Relevant CV Studies:  Echocardiogram: 01/2020 IMPRESSIONS    1. Left ventricular ejection fraction, by estimation, is 30 to 35%. The  left ventricle has moderately decreased function. The left ventricle  demonstrates global hypokinesis. There is mild concentric left ventricular  hypertrophy. Left ventricular  diastolic function could not be evaluated.  2. Right ventricular systolic function is normal. The right ventricular  size is normal. There is severely elevated pulmonary artery systolic  pressure. The estimated right ventricular systolic pressure is 24.5 mmHg.  3. Left atrial size was severely dilated.  4. Right atrial size was moderately dilated.  5. The mitral valve has been repaired/replaced. No evidence of mitral  valve regurgitation.  6. The aortic valve has been repaired/replaced. Aortic valve  regurgitation is not visualized.   Laboratory Data:  Chemistry Recent Labs  Lab 06/29/20 2002 06/30/20 0500  NA 134* 137  K 5.2*  4.6  CL 96* 94*  CO2 28 34*  GLUCOSE 116* 105*  BUN 59* 57*  CREATININE 1.70* 1.73*  CALCIUM 9.1 9.1  GFRNONAA 27* 26*  ANIONGAP 10 9    Recent Labs  Lab 06/29/20 2002 06/30/20 0500  PROT 7.9 7.4  ALBUMIN 3.8 3.3*  AST 34 23  ALT 18 16  ALKPHOS 61 53  BILITOT 0.4 0.6   Hematology Recent Labs  Lab 06/29/20 2002 06/30/20 0500  WBC 7.8 6.5  RBC 3.39* 3.10*  HGB 9.9* 9.0*  HCT 32.0* 29.6*  MCV 94.4 95.5  MCH 29.2 29.0  MCHC 30.9 30.4  RDW 17.2* 17.2*  PLT 210 197   Cardiac EnzymesNo results for input(s): TROPONINI in the last 168 hours. No results for input(s): TROPIPOC in the last 168 hours.  BNP Recent Labs  Lab 06/29/20 2002  BNP 569.0*    DDimer No results for input(s): DDIMER in the last 168 hours.  Radiology/Studies:  DG Chest Port 1 View  Result Date: 06/30/2020 CLINICAL DATA:  Congestive heart failure exacerbation. EXAM: PORTABLE CHEST 1 VIEW COMPARISON:  06/29/2020 FINDINGS: Heart is enlarged. Valve replacements are noted. Pacing for related wires are stable. Atherosclerotic calcifications are noted at the aorta. Diffuse interstitial prominence is stable mostly chronic. Effusions are present. No airspace consolidation is present. Degenerative changes are noted at the left shoulder. IMPRESSION: 1. Cardiomegaly without failure. 2. Stable chronic interstitial prominence compatible with edema. 3. Small bilateral pleural effusions are stable. Electronically Signed   By: San Morelle M.D.   On: 06/30/2020 06:11   DG Chest Port 1 View  Result Date: 06/29/2020 CLINICAL DATA:  Shortness of breath.  Pulmonary edema. EXAM: PORTABLE CHEST 1 VIEW COMPARISON:  Radiograph 06/25/2020, additional priors. CT 05/27/2020. FINDINGS: Stable cardiomegaly. Unchanged mediastinal contours. Aortic atherosclerosis. Pacemaker in place with prosthetic cardiac valves. Similar appearance of interstitial coarsening from prior exam. No progression. No focal airspace disease. No pleural  fluid. No pneumothorax. Stable osseous structures. IMPRESSION: 1. Stable cardiomegaly. 2. Unchanged interstitial coarsening, this is similar to multiple prior exams. Septal thickening on prior CT suggests an element of pulmonary edema. There is background emphysema on CT. 3. No new or acute airspace disease Electronically Signed   By: Keith Rake M.D.   On: 06/29/2020 20:31     Assessment & Plan    1. Acute  on Chronic Combined Systolic and Diastolic CHF - She has a known reduced EF of 30-35% by echocardiogram in 01/2020. Presented with worsening dyspnea on exertion and edema for the past few weeks. She does not add salt to her food but family members do bring meals to her home. She had also reduced her Lasix from 60mg  daily to 40mg  daily due to frequent urination but increased to 40mg  BID over the past week but symptoms continued to progress.  -  BNP elevated to 569. CXR shows edema and small bilateral effusions. She has been started on IV Lasix 60mg  BID and has urinated approximately 1.7 L thus far. Continue to follow I&O's with daily weights (she is unsure of her weight at home prior to admission but previous dry weight was between 191-193 lbs). Repeat BMET in AM for reassessment of renal function.  - Remains on Toprol-XL, Hydralazine and Imdur for her cardiomyopathy. Not on ACE-I/ARB/ARNI due to variable renal function.   2. CHB - She is s/p Medtronic BiV PPM in 01/2019. Followed by Dr. Sallyanne Kuster and Dr. Lovena Le. Interrogation in 04/2020 showed normal device function with episode of NSVT for 9 beats and erratic LV threshold.   3. Persistent atrial fibrillation/flutter - She denies any recent palpitations. Remains on Toprol-XL 100mg  daily for rate-control. - No reports of active bleeding. Hgb stable at 9.9. Remains on Coumadin for anticoagulation.   4. Valvular Heart Disease - She is s/p mechanical AVR in 2001 and mechanical MVR in 2004. Most recent echo in 01/2020 showed no significant valve  abnormalities. On Coumadin. Appreciate pharmacy's assistance with dosing.   5. HTN - BP has been elevated at 146/70 - 176/89 while in the ED. Continue to follow this admission and can further titrate Hydralazine or Imdur if BP remains above goal.   6. Stage 3 CKD - Baseline creatinine 1.4 - 1.5. Elevated to 1.70 on admission, at 1.73 today. Continue to follow with diuresis.   7. COPD - On 2L [place] at baseline.     For questions or updates, please contact [place] Please consult [email or web address] for contact info under Cardiology/STEMI.  Signed, Erma Heritage, PA-C 06/30/2020, 12:03 PM Pager: [phone]  Patient seen and discussed with PA Ahmed Prima, I agree with her documentation. History of chronic systolic HF, chronic hypoxic resp failure on 2L [place] at home, heart block with BiV pacemaker, history of AVR/MVR mechanical valves, afib, CKD 3 presents with SOB   Presents with 1 week progressing SOB/DOE, 15 lbs weight gain.   Was noticed 06/24/20 during nephrology appt to be volume overloaded weight up from 185 lbs to 205 lbs per there notes. Lasix was increased to 40mg  bid    ICD check showed fluid accumulation since 05/23/20, note indicates has not been taking metolazone as it has been interfering with her sleep.    ER vitals Admit weight 205 lbs 05/25/20 clinic weight 193 lbs  WBC 7.8 Hgb 9.9 Plt 210 K 5. 2 Cr 1.70 BUN 59 BNP 569 INR 2.9  hstrop 28--> COVID neg CXR suggestion of element of pulm edema 01/2020 echo LVEF 30-35%, normal MVR and AVR.     Patient presents with acute on chronic combined systolic/diastolic HF, weight up 12 lbs from 05/23/20 clinic visit, abnormal optivol on ICD check, elevated BNP. I would agree with Dr Croitoru's evaluation that the LVEF from the 01/2020 study is closer to 50% as opposed to 30-35%. Exacerbation likely due to her cutting back on her lasix and metolazone at home. Seems  she takes late afternoons, lowered her doses because  keeps her peeing into tthe night, we discused taking her diuretics in early AM. Early on in inpatient diuresis without much I/OD data thus far, she is on IV lasix 60mg  bid, stable renal function thus far. Continue IV diuresis, likely will require several days.    Carlyle Dolly MD

## 2020-06-30 NOTE — ED Notes (Signed)
Pt reports top of her feet are hurting, esp right foot. No swelling noted

## 2020-06-30 NOTE — Progress Notes (Addendum)
ANTICOAGULATION CONSULT NOTE - Initial Up Consult   Pharmacy Consult for warfarin dosing  Indication: atrial fibrillation   Allergies  Allergen Reactions   Penicillins Other (See Comments)    Bruise     Aspirin Other (See Comments)    On coumadin   Fentanyl Dermatitis    Rash with patch    Niacin Itching      Patient Measurements: Last Weight  Most recent update: 06/29/2020  7:48 PM   Weight  93 kg (205 lb)           Body mass index is 32.11 kg/m. [name] [name]               BP: 140/61 (10/06 1130) Pulse Rate: 60 (10/06 1130)  Labs: Recent Labs    06/29/20 2002 06/30/20 0500  HGB 9.9* 9.0*  HCT 32.0* 29.6*  PLT 210 197  LABPROT  --  29.2*  INR  --  2.9*  CREATININE 1.70* 1.73*    Estimated Creatinine Clearance: 27.3 mL/min (A) (by C-G formula based on SCr of 1.73 mg/dL (H)).     Medications:  (Not in a hospital admission)  Scheduled:   fluticasone  1 spray Each Nare Daily   fluticasone furoate-vilanterol  1 puff Inhalation Daily   folic acid  1 mg Oral Daily   furosemide       furosemide  60 mg Intravenous Q12H   hydrALAZINE  25 mg Oral BID   isosorbide mononitrate  15 mg Oral q morning - 10a   levothyroxine  75 mcg Oral QAC breakfast   metoprolol succinate  100 mg Oral Daily   pravastatin  40 mg Oral Daily   sodium chloride flush  3 mL Intravenous Q12H   warfarin  10 mg Oral Once per day on Sun Mon Wed Thu Sat   [START ON 07/06/2020] warfarin  5 mg Oral Once per day on Tue   Warfarin - Pharmacist Dosing Inpatient   Does not apply q1600   Infusions:   sodium chloride     PRN: sodium chloride, acetaminophen, albuterol, ondansetron (ZOFRAN) IV, polyethylene glycol powder, sodium chloride flush Anti-infectives (From admission, onward)   None      Goal of Therapy:  INR 2.5-3.5 Monitor platelets by anticoagulation protocol: Yes    Prior to Admission Warfarin Dosing:  [name] [name] takes 10mg  of warfarin everyday  except Tuesday. Tuesday patient takes warfarin 5mg  po.      Admit INR was 2.9 Lab Results  Component Value Date   INR 2.9 (H) 06/30/2020   INR 3.7 (A) 06/14/2020   INR 1.4 (A) 05/17/2020    Assessment: [name] [name] a 86 y.o. female requires anticoagulation with warfarin for the indication of  afib/aflutter with mechanical AVR and mechanical MVR. Warfarin will be initiated inpatient following pharmacy protocol per pharmacy consult. Patient most recent blood work is as follows: CBC Latest Ref Rng & Units 06/30/2020 06/29/2020 06/11/2020  WBC 4.0 - 10.5 K/uL 6.5 7.8 5.7  Hemoglobin 12.0 - 15.0 g/dL 9.0(L) 9.9(L) 8.7(L)  Hematocrit 36 - 46 % 29.6(L) 32.0(L) 29.0(L)  Platelets 150 - 400 K/uL 197 210 181     Plan: Warfarin 10mg  po daily except tuesdays, Tuesdays warfarin 5mg  po.  Monitor CBC daily with am labs   Monitor INR daily Monitor for signs and symptoms of bleeding   [name] [name] [name] [name], PharmD, MBA, BCGP Clinical Pharmacist

## 2020-06-30 NOTE — Progress Notes (Signed)
Patient seen and discussed with PA Ahmed Prima, I agree with her documentation. History of chronic systolic HF, chronic hypoxic resp failure on 2L [place] at home, heart block with BiV pacemaker, history of AVR/MVR mechanical valves, afib, CKD 3 presents with SOB   Presents with 1 week progressing SOB/DOE, 15 lbs weight gain.   Was noticed 06/24/20 during nephrology appt to be volume overloaded weight up from 185 lbs to 205 lbs per there notes. Lasix was increased to 40mg  bid    ICD check showed fluid accumulation since 05/23/20, note indicates has not been taking metolazone as it has been interfering with her sleep.    ER vitals Admit weight 205 lbs 05/25/20 clinic weight 193 lbs  WBC 7.8 Hgb 9.9 Plt 210 K 5. 2 Cr 1.70 BUN 59 BNP 569 INR 2.9  hstrop 28--> COVID neg CXR suggestion of element of pulm edema 01/2020 echo LVEF 30-35%, normal MVR and AVR.     Patient presents with acute on chronic combined systolic/diastolic HF, weight up 12 lbs from 05/23/20 clinic visit, abnormal optivol on ICD check, elevated BNP. I would agree with Dr Croitoru's evaluation that the LVEF from the 01/2020 study is closer to 50% as opposed to 30-35%. Exacerbation likely due to her cutting back on her lasix and metolazone at home. Seems she takes late afternoons, lowered her doses because keeps her peeing into tthe night, we discused taking her diuretics in early AM. Early on in inpatient diuresis without much I/OD data thus far, she is on IV lasix 60mg  bid, stable renal function thus far. Continue IV diuresis, likely will require several days.    Carlyle Dolly MD

## 2020-06-30 NOTE — Plan of Care (Signed)
  Problem: Education: Goal: Knowledge of General Education information will improve Description: Including pain rating scale, medication(s)/side effects and non-pharmacologic comfort measures Outcome: Progressing   Problem: Health Behavior/Discharge Planning: Goal: Ability to manage health-related needs will improve Outcome: Progressing   Problem: Clinical Measurements: Goal: Will remain free from infection Outcome: Progressing Goal: Diagnostic test results will improve Outcome: Progressing Goal: Respiratory complications will improve Outcome: Progressing Goal: Cardiovascular complication will be avoided Outcome: Progressing   

## 2020-07-01 DIAGNOSIS — I5043 Acute on chronic combined systolic (congestive) and diastolic (congestive) heart failure: Secondary | ICD-10-CM | POA: Diagnosis not present

## 2020-07-01 LAB — CBC
HCT: 28.9 % — ABNORMAL LOW (ref 36.0–46.0)
Hemoglobin: 8.8 g/dL — ABNORMAL LOW (ref 12.0–15.0)
MCH: 28.6 pg (ref 26.0–34.0)
MCHC: 30.4 g/dL (ref 30.0–36.0)
MCV: 93.8 fL (ref 80.0–100.0)
Platelets: 203 10*3/uL (ref 150–400)
RBC: 3.08 MIL/uL — ABNORMAL LOW (ref 3.87–5.11)
RDW: 17.2 % — ABNORMAL HIGH (ref 11.5–15.5)
WBC: 5.8 10*3/uL (ref 4.0–10.5)
nRBC: 0 % (ref 0.0–0.2)

## 2020-07-01 LAB — PROTIME-INR
INR: 2.5 — ABNORMAL HIGH (ref 0.8–1.2)
Prothrombin Time: 26.1 seconds — ABNORMAL HIGH (ref 11.4–15.2)

## 2020-07-01 LAB — COMPREHENSIVE METABOLIC PANEL
ALT: 13 U/L (ref 0–44)
AST: 22 U/L (ref 15–41)
Albumin: 3.2 g/dL — ABNORMAL LOW (ref 3.5–5.0)
Alkaline Phosphatase: 50 U/L (ref 38–126)
Anion gap: 11 (ref 5–15)
BUN: 55 mg/dL — ABNORMAL HIGH (ref 8–23)
CO2: 34 mmol/L — ABNORMAL HIGH (ref 22–32)
Calcium: 9.3 mg/dL (ref 8.9–10.3)
Chloride: 93 mmol/L — ABNORMAL LOW (ref 98–111)
Creatinine, Ser: 1.45 mg/dL — ABNORMAL HIGH (ref 0.44–1.00)
GFR calc non Af Amer: 33 mL/min — ABNORMAL LOW (ref >60)
Glucose, Bld: 81 mg/dL (ref 70–99)
Potassium: 4 mmol/L (ref 3.5–5.1)
Sodium: 138 mmol/L (ref 135–145)
Total Bilirubin: 0.9 mg/dL (ref 0.3–1.2)
Total Protein: 6.9 g/dL (ref 6.5–8.1)

## 2020-07-01 LAB — HEMOGLOBIN AND HEMATOCRIT, BLOOD
HCT: 29.1 % — ABNORMAL LOW (ref 36.0–46.0)
Hemoglobin: 8.9 g/dL — ABNORMAL LOW (ref 12.0–15.0)

## 2020-07-01 LAB — MAGNESIUM: Magnesium: 2 mg/dL (ref 1.7–2.4)

## 2020-07-01 LAB — PHOSPHORUS: Phosphorus: 3.3 mg/dL (ref 2.5–4.6)

## 2020-07-01 MED ORDER — GABAPENTIN 300 MG PO CAPS
300.0000 mg | ORAL_CAPSULE | Freq: Two times a day (BID) | ORAL | Status: DC
  Administered 2020-07-01 – 2020-07-07 (×13): 300 mg via ORAL
  Filled 2020-07-01 (×14): qty 1

## 2020-07-01 NOTE — Progress Notes (Addendum)
Progress Note  Patient Name: [name] [name] Date of Encounter: 07/01/2020  Primary Cardiologist: Sanda Klein, MD   Subjective   Breathing improved. Still on 3L (on 2L at baseline). No chest pain or palpitations. Reports neuropathy along her lower extremities overnight (on Gabapentin previously but not ordered on admission).   Inpatient Medications    Scheduled Meds: . fluticasone  1 spray Each Nare Daily  . fluticasone furoate-vilanterol  1 puff Inhalation Daily  . folic acid  1 mg Oral Daily  . furosemide  60 mg Intravenous Q12H  . hydrALAZINE  25 mg Oral BID  . isosorbide mononitrate  15 mg Oral q morning - 10a  . levothyroxine  75 mcg Oral QAC breakfast  . metoprolol succinate  100 mg Oral Daily  . pravastatin  40 mg Oral Daily  . sodium chloride flush  3 mL Intravenous Q12H  . warfarin  10 mg Oral Once per day on Sun Mon Wed Thu Sat  . [START ON 07/06/2020] warfarin  5 mg Oral Once per day on Tue  . Warfarin - Pharmacist Dosing Inpatient   Does not apply q1600   Continuous Infusions: . sodium chloride     PRN Meds: sodium chloride, acetaminophen, albuterol, ondansetron (ZOFRAN) IV, polyethylene glycol powder, sodium chloride flush   Vital Signs    Vitals:   06/30/20 2003 07/01/20 0029 07/01/20 0429 07/01/20 0633  BP: (!) 165/83 (!) 146/74 (!) 147/73   Pulse: 62 (!) 59 60   Resp: 20 20 20    Temp: 97.8 F (36.6 C) 98.6 F (37 C) 98.1 F (36.7 C)   TempSrc: Oral Oral Oral   SpO2: 96% 94% 97%   Weight:    85.8 kg  Height:        Intake/Output Summary (Last 24 hours) at 07/01/2020 0758 Last data filed at 07/01/2020 0634 Gross per 24 hour  Intake 3 ml  Output 3800 ml  Net -3797 ml    Last 3 Weights 07/01/2020 06/29/2020 06/29/2020  Weight (lbs) 189 lb 2.5 oz 205 lb 205 lb  Weight (kg) 85.8 kg 92.987 kg 92.987 kg      Telemetry    V-paced, HR in 60's. - Personally Reviewed  ECG    No new tracings.   Physical Exam   General: Elderly female  appearing in no acute distress. Head: Normocephalic, atraumatic.  Neck: Supple without bruits, JVD not elevated. Lungs:  Resp regular and unlabored, mildly decreased breath sounds along bases. Heart: Irregularly irregular, S1, S2, Crisp mechanical valve sounds.  Abdomen: Soft, non-tender, non-distended with normoactive bowel sounds. No hepatomegaly. No rebound/guarding. No obvious abdominal masses. Extremities: No clubbing or cyanosis, 1+ pitting edema bilaterally. Distal pedal pulses are 2+ bilaterally. Neuro: Alert and oriented X 3. Moves all extremities spontaneously. Psych: Normal affect.  Labs    Chemistry Recent Labs  Lab 06/29/20 2002 06/30/20 0500  NA 134* 137  K 5.2* 4.6  CL 96* 94*  CO2 28 34*  GLUCOSE 116* 105*  BUN 59* 57*  CREATININE 1.70* 1.73*  CALCIUM 9.1 9.1  PROT 7.9 7.4  ALBUMIN 3.8 3.3*  AST 34 23  ALT 18 16  ALKPHOS 61 53  BILITOT 0.4 0.6  GFRNONAA 27* 26*  ANIONGAP 10 9     Hematology Recent Labs  Lab 06/29/20 2002 06/30/20 0500  WBC 7.8 6.5  RBC 3.39* 3.10*  HGB 9.9* 9.0*  HCT 32.0* 29.6*  MCV 94.4 95.5  MCH 29.2 29.0  MCHC 30.9 30.4  RDW  17.2* 17.2*  PLT 210 197    Cardiac EnzymesNo results for input(s): TROPONINI in the last 168 hours. No results for input(s): TROPIPOC in the last 168 hours.   BNP Recent Labs  Lab 06/29/20 2002  BNP 569.0*     DDimer No results for input(s): DDIMER in the last 168 hours.   Radiology    DG Chest Port 1 View  Result Date: 06/30/2020 CLINICAL DATA:  Congestive heart failure exacerbation. EXAM: PORTABLE CHEST 1 VIEW COMPARISON:  06/29/2020 FINDINGS: Heart is enlarged. Valve replacements are noted. Pacing for related wires are stable. Atherosclerotic calcifications are noted at the aorta. Diffuse interstitial prominence is stable mostly chronic. Effusions are present. No airspace consolidation is present. Degenerative changes are noted at the left shoulder. IMPRESSION: 1. Cardiomegaly without  failure. 2. Stable chronic interstitial prominence compatible with edema. 3. Small bilateral pleural effusions are stable. Electronically Signed   By: San Morelle M.D.   On: 06/30/2020 06:11   DG Chest Port 1 View  Result Date: 06/29/2020 CLINICAL DATA:  Shortness of breath.  Pulmonary edema. EXAM: PORTABLE CHEST 1 VIEW COMPARISON:  Radiograph 06/25/2020, additional priors. CT 05/27/2020. FINDINGS: Stable cardiomegaly. Unchanged mediastinal contours. Aortic atherosclerosis. Pacemaker in place with prosthetic cardiac valves. Similar appearance of interstitial coarsening from prior exam. No progression. No focal airspace disease. No pleural fluid. No pneumothorax. Stable osseous structures. IMPRESSION: 1. Stable cardiomegaly. 2. Unchanged interstitial coarsening, this is similar to multiple prior exams. Septal thickening on prior CT suggests an element of pulmonary edema. There is background emphysema on CT. 3. No new or acute airspace disease Electronically Signed   By: Keith Rake M.D.   On: 06/29/2020 20:31    Cardiac Studies   Echocardiogram: 01/2020 IMPRESSIONS    1. Left ventricular ejection fraction, by estimation, is 30 to 35%. The  left ventricle has moderately decreased function. The left ventricle  demonstrates global hypokinesis. There is mild concentric left ventricular  hypertrophy. Left ventricular  diastolic function could not be evaluated.  2. Right ventricular systolic function is normal. The right ventricular  size is normal. There is severely elevated pulmonary artery systolic  pressure. The estimated right ventricular systolic pressure is 02.6 mmHg.  3. Left atrial size was severely dilated.  4. Right atrial size was moderately dilated.  5. The mitral valve has been repaired/replaced. No evidence of mitral  valve regurgitation.  6. The aortic valve has been repaired/replaced. Aortic valve  regurgitation is not visualized.   Patient Profile     86  y.o. female w/PMH of CHB (s/p [employer] BiV PPMin 01/2019), persistent atrial fibrillation/flutter, chronic combined systolic and diastolic CHF (EF previously as low as 20-25%, normalized by repeat echo in 2019 and 02/2019, EF 30-35% by echocardiogram in 01/2020), mechanical AVR in 2001 and mechanical MVR in 2004, HTN, HLD, Type 2 DM, COPD and Stage 3 CKD who is currently admitted for an acute CHF exacerbation.   Assessment & Plan    1. Acute on Chronic Combined Systolic and Diastolic CHF - She has a known reduced EF of 30-35% by echocardiogram in 01/2020(from study review looks closer to 50%). She was prescribed Lasix 60mg  daily as an outpatient but was typically only taking 40mg  daily due to frequent urination.  - BNP on admission was at 569 and CXR consistent with CHF. Baseline weight reported as 191 - 193 lbs but at 189 lbs this AM and still looks volume overloaded. Will need to establish a new dry weight. She is currently  receiving IV Lasix 60mg  BID with a recorded output of -3.7 L thus far. Will order a repeat BMET for this AM. If creatinine trending upwards, may need to reduce Lasix to 40mg  BID but would otherwise continue at her current dosing.  - Continue Toprol-XL, Hydralazine and Imdur. She is not on ACE-I/ARB/ARNI due to variable renal function.   2. CHB - She is s/p [employer] BiV PPMin 01/2019. Followed by Dr. Sallyanne Kuster and Dr. Lovena Le. Device interrogation in 04/2020 suggested fluid accumulation.   3. Persistent atrial fibrillation/flutter - Rates well-controlled in the 60's. Continue Toprol-XL for rate-control. Remains on Coumadin for anticoagulation.   4. Valvular Heart Disease - She is s/p mechanical AVR in 2001 and mechanical MVR in 2004. No significant valve abnormalities by echo in 01/2020. Pharmacy following to assist with Coumadin dosing. INR was 2.9 on 06/30/2020.  5. HTN - BP has been variable from 132/56 - 178/90 within the past 24 hours, at 147/73 on most recent check.  Continue to follow once she has received her AM medications.   6. Stage 3 CKD - Baseline creatinine 1.4 - 1.5. At 1.73 on most recent check. Will order BMET for this AM.   7. COPD - Currently on 3L [place] with appropriate oxygen saturations. On 2L at baseline.   8. Peripheral Neuropathy - Will reorder her PTA Gabapentin 300mg  BID.   For questions or updates, please contact [place] Please consult [email or web address] for contact info under Cardiology/STEMI.   Arna Medici , PA-C 7:58 AM 07/01/2020 Pager: [phone]   Patient seen and disucssed with PA Ahmed Prima, I agree with her documentation. Admitted with acute on chronic systolic/diastolic HF in setting of decreased home diuretic compliance. Negative 3.8 L yesterday though limited I/Os documentation, she had significant output in the ER when I had seen her. Labs pending this AM, she is IV lasix 60mg  bid, conitnue dosing. Will require several days of IV diuresis. I would agree with Dr Croitoru's clinic note that most recent 01/2020 echo LVEF looks closer to 50%, do not think this was a repeat drop in LVEF. Continue current meds   Carlyle Dolly MD

## 2020-07-01 NOTE — Progress Notes (Signed)
ANTICOAGULATION CONSULT NOTE -   Pharmacy Consult for warfarin dosing  Indication: atrial fibrillation   Allergies  Allergen Reactions   Penicillins Other (See Comments)    Bruise     Aspirin Other (See Comments)    On coumadin   Fentanyl Dermatitis    Rash with patch    Niacin Itching      Patient Measurements: Last Weight  Most recent update: 07/01/2020  6:34 AM   Weight  85.8 kg (189 lb 2.5 oz)           Body mass index is 29.63 kg/m. [name] [name]               Temp: 98.1 F (36.7 C) (10/07 0429) Temp Source: Oral (10/07 0429) BP: 147/73 (10/07 0429) Pulse Rate: 60 (10/07 0429)  Labs: Recent Labs    06/29/20 2002 06/29/20 2002 06/30/20 0500 07/01/20 0545  HGB 9.9*   < > 9.0* 8.8*  HCT 32.0*  --  29.6* 28.9*  PLT 210  --  197 203  LABPROT  --   --  29.2* 26.1*  INR  --   --  2.9* 2.5*  CREATININE 1.70*  --  1.73* 1.45*   < > = values in this interval not displayed.    Estimated Creatinine Clearance: 31.3 mL/min (A) (by C-G formula based on SCr of 1.45 mg/dL (H)).     Medications:  Medications Prior to Admission  Medication Sig Dispense Refill Last Dose   acetaminophen (TYLENOL) 500 MG tablet Take 500 mg by mouth every 6 (six) hours as needed (pain).    Past Week at Unknown time   albuterol (PROVENTIL) (2.5 MG/3ML) 0.083% nebulizer solution Take 3 mLs (2.5 mg total) by nebulization in the morning and at bedtime. *May use additional one time if needed for shortness of breath   06/29/2020 at Unknown time   albuterol (VENTOLIN HFA) 108 (90 Base) MCG/ACT inhaler Inhale 1-2 puffs into the lungs every 6 (six) hours as needed for wheezing or shortness of breath.   06/29/2020 at Unknown time   allopurinol (ZYLOPRIM) 100 MG tablet Take 1 tablet (100 mg total) by mouth in the morning.   06/29/2020 at 01000   BREO ELLIPTA 100-25 MCG/INH AEPB 1 TAKE 1 INHALATION BY MOUTH ONCE DAILY 1 each 4 06/29/2020 at Unknown time   cholecalciferol (VITAMIN D3) 25 MCG  (1000 UT) tablet Take 1,000 Units by mouth in the morning.    06/29/2020 at Unknown time   docusate sodium (COLACE) 100 MG capsule Take 300 mg by mouth at bedtime.    06/29/2020 at Unknown time   fluticasone (FLONASE) 50 MCG/ACT nasal spray SHAKE LIQUID AND USE 2 SPRAYS IN EACH NOSTRIL DAILY 48 g 1 06/29/2020 at Unknown time   folic acid (FOLVITE) 1 MG tablet TAKE 1 TABLET(1 MG) BY MOUTH DAILY 100 tablet 5 06/29/2020 at Unknown time   furosemide (LASIX) 40 MG tablet Take 40 mg by mouth daily. Take 80mg  BID x3DAYS THEN BACK TO 40MG  BID   06/29/2020 at Unknown time   gabapentin (NEURONTIN) 300 MG capsule TAKE 1 CAPSULE(300 MG) BY MOUTH TWICE DAILY (Patient taking differently: Take 300 mg by mouth daily. ) 60 capsule 3 06/29/2020 at Unknown time   hydrALAZINE (APRESOLINE) 25 MG tablet Take 1 tablet (25 mg total) by mouth 2 (two) times daily. 180 tablet 3 06/29/2020 at Unknown time   isosorbide mononitrate (IMDUR) 30 MG 24 hr tablet Take 0.5 tablets (15 mg total) by mouth every  morning. 30 tablet 2 06/29/2020 at Unknown time   Liniments (SALONPAS PAIN RELIEF PATCH EX) Apply 1 patch topically daily as needed (pain).    06/28/2020 at Unknown time   meclizine (ANTIVERT) 12.5 MG tablet TAKE 1 TABLET(12.5 MG) BY MOUTH THREE TIMES DAILY AS NEEDED FOR DIZZINESS (Patient taking differently: Take 12.5 mg by mouth 3 (three) times daily as needed for dizziness. ) 30 tablet 2 Past Month at Unknown time   metolazone (ZAROXOLYN) 2.5 MG tablet Take one tablet on Saturdays. (Patient taking differently: Take 2.5 mg by mouth once a week. Take one tablet on Saturdays.) 12 tablet 5 Past Month at Unknown time   metoprolol succinate (TOPROL-XL) 100 MG 24 hr tablet TAKE 1 TABLET BY MOUTH EVERY DAY WITH FOOD OR MILK (Patient taking differently: Take 100 mg by mouth daily. TAKE 1 TABLET BY MOUTH EVERY DAY WITH FOOD OR MILK) 90 tablet 0 06/29/2020 at 01000   nitroGLYCERIN (NITROSTAT) 0.4 MG SL tablet Place 1 tablet (0.4 mg total)  under the tongue every 5 (five) minutes as needed for chest pain. 30 tablet 0 unk   ondansetron (ZOFRAN ODT) 4 MG disintegrating tablet Take 1 tablet (4 mg total) by mouth every 8 (eight) hours as needed for nausea or vomiting. 10 tablet 0 06/28/2020 at Unknown time   Polyethyl [name] (SYSTANE) 0.4-0.3 % GEL ophthalmic gel Place 1 application into the left eye at bedtime.   06/28/2020 at Unknown time   polyethylene glycol powder (GLYCOLAX/MIRALAX) 17 GM/SCOOP powder Take 17 g by mouth daily as needed for mild constipation or moderate constipation.   Past Month at Unknown time   potassium chloride SA (KLOR-CON) 20 MEQ tablet Take 1 tablet (20 mEq total) by mouth daily. TAKE 40MEQ DAILY x3 DAYS THEN BACK TO 20MEQ DAILY (Patient taking differently: Take 20 mEq by mouth daily. )   06/29/2020 at Unknown time   pravastatin (PRAVACHOL) 40 MG tablet TAKE 1 TABLET BY MOUTH EVERY EVENING (Patient taking differently: Take 40 mg by mouth daily. ) 90 tablet 3 06/29/2020 at Unknown time   SYNTHROID 75 MCG tablet Take 1 tablet (75 mcg total) by mouth daily before breakfast.   06/29/2020 at Unknown time   warfarin (JANTOVEN) 5 MG tablet Take 1-2 tablets (5-10 mg total) by mouth See admin instructions. TAKE 2 TABLETS EVERY DAY EXCEPT 1 TABLET ON TUESDAYS @ 8:30PM   06/28/2020 at 2030   benzonatate (TESSALON) 100 MG capsule Take 1 capsule (100 mg total) by mouth 2 (two) times daily as needed for cough. (Patient not taking: Reported on 06/29/2020) 20 capsule 0 Not Taking at Unknown time   Scheduled:   fluticasone  1 spray Each Nare Daily   fluticasone furoate-vilanterol  1 puff Inhalation Daily   folic acid  1 mg Oral Daily   furosemide  60 mg Intravenous Q12H   gabapentin  300 mg Oral BID   hydrALAZINE  25 mg Oral BID   isosorbide mononitrate  15 mg Oral q morning - 10a   levothyroxine  75 mcg Oral QAC breakfast   metoprolol succinate  100 mg Oral Daily   pravastatin  40 mg Oral Daily    sodium chloride flush  3 mL Intravenous Q12H   warfarin  10 mg Oral Once per day on Sun Mon Wed Thu Sat   [START ON 07/06/2020] warfarin  5 mg Oral Once per day on Tue   Warfarin - Pharmacist Dosing Inpatient   Does not apply q1600   Infusions:  sodium chloride     PRN: sodium chloride, acetaminophen, albuterol, ondansetron (ZOFRAN) IV, polyethylene glycol powder, sodium chloride flush Anti-infectives (From admission, onward)   None      Goal of Therapy:  INR 2.5-3.5 Monitor platelets by anticoagulation protocol: Yes    Prior to Admission Warfarin Dosing:  Katiria Calame takes 10mg  of warfarin everyday except Tuesday. Tuesday patient takes warfarin 5mg  po.      Admit INR was 2.9 Lab Results  Component Value Date   INR 2.5 (H) 07/01/2020   INR 2.9 (H) 06/30/2020   INR 3.7 (A) 06/14/2020    Assessment: [name] [name] a 86 y.o. female requires anticoagulation with warfarin for the indication of  afib/aflutter with mechanical AVR and mechanical MVR. Warfarin will be initiated inpatient following pharmacy protocol per pharmacy consult. Patient most recent blood work is as follows: CBC Latest Ref Rng & Units 07/01/2020 06/30/2020 06/29/2020  WBC 4.0 - 10.5 K/uL 5.8 6.5 7.8  Hemoglobin 12.0 - 15.0 g/dL 8.8(L) 9.0(L) 9.9(L)  Hematocrit 36 - 46 % 28.9(L) 29.6(L) 32.0(L)  Platelets 150 - 400 K/uL 203 197 210   Therapeutic today at 2.5, continue home regimen  Plan: Warfarin 10mg  po daily except tuesdays, Tuesdays warfarin 5mg  po.  Monitor CBC daily with am labs   Monitor INR daily Monitor for signs and symptoms of bleeding   Isac Sarna, BS [place], BCPS Clinical Pharmacist Pager [phone]

## 2020-07-01 NOTE — Progress Notes (Signed)
PROGRESS NOTE    [name] [name]  VEL:[id number] DOB: 11-15-1931 DOA: 06/29/2020 PCP: Fayrene Helper, MD   Brief Narrative:  86 years old female with history of hypothyroidism,  type 2 diabetes, nonischemic cardiomyopathy, hyperlipidemia, GERD, CKD stage III, aortic valve repair, CHB, s.p PPM, Persistent Atrial fibrillation , Chronic respiratory failure presented in the ED with worsening shortness of breath.  Patient reported doubling her dose of Lasix with increased urine output but had worsening shortness of breath.  Denies any chest pain. Patient is admitted for acute on chronic systolic CHF exacerbation. She is improving , 3.8 L negative balance, may require few days of iv diuresis.  Assessment & Plan:   Active Problems:   CHF exacerbation ([place])  1. Acute on chronic combined systolic and diastolic CHF exacerbation:  Presents with orthopnea, weight gain and worsening SOB, pedal edema.  CXR consistent with with pulm edema,  BNP 569, Cardiomegaly on CXR.  Requiring 3L [place] when baseline is 2L [place]  Recent Echo 01/2020 = EF 30-35%.  Continue lasix, daily weights, Intake and output, low sodium diet, and fluid restrictions  No ACE/ARB 2/2 renal insufficiency.  Continue beta blocker. Continue Hydralazine and Imdur,  Cardiology consulted, recommended continue IV diuresis.   She is improving , 3.8 L negative balance, may require few days of iv diuresis.  2. Elevated troponin Trop 28 - 31 In the setting of renal insufficiency. Likely demand ischemia. She denies any chest pain, palpitations, dizzziness.  3. Renal insufficiency CKD stage IV Cr baseline is 1.4 - 1.5. Today 1.73 Could  Be Cardiorenal syndrome. Continue lasix 60 mg iv bid.  Re-check in the AM ,   4. HTN: Continue home medications.  5. Hypothyroidism: Continue synthroid.   6.: Persistent Atrial fibrillation: Heart rate remains controlled, denies any palpitations. continue Toprol-XL 100 mg daily, continue Coumadin for  anticoagulation.  7. CHB, S/P PPM: Recently interrogated in August 2081 shows normal device function.  8.  Valvular heart disease: S/p mechanical AVR in 2001 and mechanical MVR in 2004 Recent echo shows no significant valve abnormalities continue Coumadin.  9. COPD: not in exacerbation. Continue supplemental oxygen @ 3L/ m  DVT prophylaxis: Coumadin Code Status: DNR Family Communication: No one at bed side. Disposition Plan:   Status is: Inpatient  Remains inpatient appropriate because:Inpatient level of care appropriate due to severity of illness   Dispo: The patient is from: Home              Anticipated d/c is to: SNF              Anticipated d/c date is: 3 days              Patient currently is not medically stable to d/c.  Consultants:   Cardiology  Procedures: None.  Antimicrobials:  Anti-infectives (From admission, onward)   None     Subjective: Patient is seen and examined at bedside.  Overnight events noted.  She reports feeling better,  denies any shortness of breath.  Still has significant pedal edema, but lung sounds clear.    Objective: Vitals:   07/01/20 0429 07/01/20 0633 07/01/20 0844 07/01/20 1434  BP: (!) 147/73   136/61  Pulse: 60   (!) 59  Resp: 20   16  Temp: 98.1 F (36.7 C)   98.6 F (37 C)  TempSrc: Oral   Oral  SpO2: 97%  99% 99%  Weight:  85.8 kg    Height:  Intake/Output Summary (Last 24 hours) at 07/01/2020 1443 Last data filed at 07/01/2020 0634 Gross per 24 hour  Intake --  Output 1850 ml  Net -1850 ml   Filed Weights   06/29/20 1947 07/01/20 0633  Weight: 93 kg 85.8 kg    Examination:  General exam: Appears calm and comfortable.  Respiratory system: Clear auscultation. Respiratory effort normal. Cardiovascular system: S1 & S2 heard, RRR. No JVD, murmurs, rubs, gallops or clicks. No pedal edema. Gastrointestinal system: Abdomen is nondistended, soft and nontender. No organomegaly or masses felt. Normal bowel  sounds heard. Central nervous system: Alert and oriented. No focal neurological deficits. Extremities: pedal edema++, no cyanosis, no clubbing Skin: No rashes, lesions or ulcers Psychiatry: Judgement and insight appear normal. Mood & affect appropriate.     Data Reviewed: I have personally reviewed following labs and imaging studies  CBC: Recent Labs  Lab 06/29/20 2002 06/30/20 0500 07/01/20 0545  WBC 7.8 6.5 5.8  NEUTROABS 6.9 5.4  --   HGB 9.9* 9.0* 8.8*  HCT 32.0* 29.6* 28.9*  MCV 94.4 95.5 93.8  PLT 210 197 476   Basic Metabolic Panel: Recent Labs  Lab 06/29/20 2002 06/30/20 0500 07/01/20 0545  NA 134* 137 138  K 5.2* 4.6 4.0  CL 96* 94* 93*  CO2 28 34* 34*  GLUCOSE 116* 105* 81  BUN 59* 57* 55*  CREATININE 1.70* 1.73* 1.45*  CALCIUM 9.1 9.1 9.3  MG  --  2.1 2.0  PHOS  --   --  3.3   GFR: Estimated Creatinine Clearance: 31.3 mL/min (A) (by C-G formula based on SCr of 1.45 mg/dL (H)). Liver Function Tests: Recent Labs  Lab 06/29/20 2002 06/30/20 0500 07/01/20 0545  AST 34 23 22  ALT 18 16 13   ALKPHOS 61 53 50  BILITOT 0.4 0.6 0.9  PROT 7.9 7.4 6.9  ALBUMIN 3.8 3.3* 3.2*   No results for input(s): LIPASE, AMYLASE in the last 168 hours. No results for input(s): AMMONIA in the last 168 hours. Coagulation Profile: Recent Labs  Lab 06/30/20 0500 07/01/20 0545  INR 2.9* 2.5*   Cardiac Enzymes: No results for input(s): CKTOTAL, CKMB, CKMBINDEX, TROPONINI in the last 168 hours. BNP (last 3 results) No results for input(s): PROBNP in the last 8760 hours. HbA1C: No results for input(s): HGBA1C in the last 72 hours. CBG: No results for input(s): GLUCAP in the last 168 hours. Lipid Profile: No results for input(s): CHOL, HDL, LDLCALC, TRIG, CHOLHDL, LDLDIRECT in the last 72 hours. Thyroid Function Tests: No results for input(s): TSH, T4TOTAL, FREET4, T3FREE, THYROIDAB in the last 72 hours. Anemia Panel: No results for input(s): VITAMINB12, FOLATE,  FERRITIN, TIBC, IRON, RETICCTPCT in the last 72 hours. Sepsis Labs: No results for input(s): PROCALCITON, LATICACIDVEN in the last 168 hours.  Recent Results (from the past 240 hour(s))  Respiratory Panel by RT PCR (Flu A&B, Covid) - Nasopharyngeal Swab     Status: None   Collection Time: 06/29/20  7:57 PM   Specimen: Nasopharyngeal Swab  Result Value Ref Range Status   SARS Coronavirus 2 by RT PCR NEGATIVE NEGATIVE Final    Comment: (NOTE) SARS-CoV-2 target nucleic acids are NOT DETECTED.  The SARS-CoV-2 RNA is generally detectable in upper respiratoy specimens during the acute phase of infection. The lowest concentration of SARS-CoV-2 viral copies this assay can detect is 131 copies/mL. A negative result does not preclude SARS-Cov-2 infection and should not be used as the sole basis for treatment or other patient  management decisions. A negative result may occur with  improper specimen collection/handling, submission of specimen other than nasopharyngeal swab, presence of viral mutation(s) within the areas targeted by this assay, and inadequate number of viral copies (<131 copies/mL). A negative result must be combined with clinical observations, patient history, and epidemiological information. The expected result is Negative.  Fact Sheet for Patients:  PinkCheek.be  Fact Sheet for Healthcare Providers:  GravelBags.it  This test is no t yet approved or cleared by the [place] FDA and  has been authorized for detection and/or diagnosis of SARS-CoV-2 by FDA under an Emergency Use Authorization (EUA). This EUA will remain  in effect (meaning this test can be used) for the duration of the COVID-19 declaration under Section 564(b)(1) of the Act, 21 U.S.C. section 360bbb-3(b)(1), unless the authorization is terminated or revoked sooner.     Influenza A by PCR NEGATIVE NEGATIVE Final   Influenza B by PCR NEGATIVE  NEGATIVE Final    Comment: (NOTE) The Xpert Xpress SARS-CoV-2/FLU/RSV assay is intended as an aid in  the diagnosis of influenza from Nasopharyngeal swab specimens and  should not be used as a sole basis for treatment. Nasal washings and  aspirates are unacceptable for Xpert Xpress SARS-CoV-2/FLU/RSV  testing.  Fact Sheet for Patients: PinkCheek.be  Fact Sheet for Healthcare Providers: GravelBags.it  This test is not yet approved or cleared by the [place] FDA and  has been authorized for detection and/or diagnosis of SARS-CoV-2 by  FDA under an Emergency Use Authorization (EUA). This EUA will remain  in effect (meaning this test can be used) for the duration of the  Covid-19 declaration under Section 564(b)(1) of the Act, 21  U.S.C. section 360bbb-3(b)(1), unless the authorization is  terminated or revoked. Performed at [facility], [address] [place], [place] [address]      Radiology Studies: [facility] 1 View  Result Date: 06/30/2020 CLINICAL DATA:  Congestive heart failure exacerbation. EXAM: PORTABLE CHEST 1 VIEW COMPARISON:  06/29/2020 FINDINGS: Heart is enlarged. Valve replacements are noted. Pacing for related wires are stable. Atherosclerotic calcifications are noted at the aorta. Diffuse interstitial prominence is stable mostly chronic. Effusions are present. No airspace consolidation is present. Degenerative changes are noted at the left shoulder. IMPRESSION: 1. Cardiomegaly without failure. 2. Stable chronic interstitial prominence compatible with edema. 3. Small bilateral pleural effusions are stable. Electronically Signed   By: San Morelle M.D.   On: 06/30/2020 06:11   DG Chest Port 1 View  Result Date: 06/29/2020 CLINICAL DATA:  Shortness of breath.  Pulmonary edema. EXAM: PORTABLE CHEST 1 VIEW COMPARISON:  Radiograph 06/25/2020, additional priors. CT 05/27/2020. FINDINGS: Stable  cardiomegaly. Unchanged mediastinal contours. Aortic atherosclerosis. Pacemaker in place with prosthetic cardiac valves. Similar appearance of interstitial coarsening from prior exam. No progression. No focal airspace disease. No pleural fluid. No pneumothorax. Stable osseous structures. IMPRESSION: 1. Stable cardiomegaly. 2. Unchanged interstitial coarsening, this is similar to multiple prior exams. Septal thickening on prior CT suggests an element of pulmonary edema. There is background emphysema on CT. 3. No new or acute airspace disease Electronically Signed   By: Keith Rake M.D.   On: 06/29/2020 20:31    Scheduled Meds: . fluticasone  1 spray Each Nare Daily  . fluticasone furoate-vilanterol  1 puff Inhalation Daily  . folic acid  1 mg Oral Daily  . furosemide  60 mg Intravenous Q12H  . gabapentin  300 mg Oral BID  . hydrALAZINE  25 mg Oral  BID  . isosorbide mononitrate  15 mg Oral q morning - 10a  . levothyroxine  75 mcg Oral QAC breakfast  . metoprolol succinate  100 mg Oral Daily  . pravastatin  40 mg Oral Daily  . sodium chloride flush  3 mL Intravenous Q12H  . warfarin  10 mg Oral Once per day on Sun Mon Wed Thu Sat  . [START ON 07/06/2020] warfarin  5 mg Oral Once per day on Tue  . Warfarin - Pharmacist Dosing Inpatient   Does not apply q1600   Continuous Infusions: . sodium chloride       LOS: 1 day    Time spent: 25 mins.    Shawna Clamp, MD [facility]   If 7PM-7AM, please contact night-coverage

## 2020-07-02 DIAGNOSIS — I5033 Acute on chronic diastolic (congestive) heart failure: Secondary | ICD-10-CM

## 2020-07-02 LAB — CBC
HCT: 28.4 % — ABNORMAL LOW (ref 36.0–46.0)
Hemoglobin: 8.6 g/dL — ABNORMAL LOW (ref 12.0–15.0)
MCH: 28.7 pg (ref 26.0–34.0)
MCHC: 30.3 g/dL (ref 30.0–36.0)
MCV: 94.7 fL (ref 80.0–100.0)
Platelets: 191 10*3/uL (ref 150–400)
RBC: 3 MIL/uL — ABNORMAL LOW (ref 3.87–5.11)
RDW: 17.1 % — ABNORMAL HIGH (ref 11.5–15.5)
WBC: 5.5 10*3/uL (ref 4.0–10.5)
nRBC: 0 % (ref 0.0–0.2)

## 2020-07-02 LAB — BASIC METABOLIC PANEL
Anion gap: 9 (ref 5–15)
BUN: 60 mg/dL — ABNORMAL HIGH (ref 8–23)
CO2: 36 mmol/L — ABNORMAL HIGH (ref 22–32)
Calcium: 8.8 mg/dL — ABNORMAL LOW (ref 8.9–10.3)
Chloride: 93 mmol/L — ABNORMAL LOW (ref 98–111)
Creatinine, Ser: 1.87 mg/dL — ABNORMAL HIGH (ref 0.44–1.00)
GFR calc non Af Amer: 24 mL/min — ABNORMAL LOW (ref >60)
Glucose, Bld: 94 mg/dL (ref 70–99)
Potassium: 4.2 mmol/L (ref 3.5–5.1)
Sodium: 138 mmol/L (ref 135–145)

## 2020-07-02 LAB — PROTIME-INR
INR: 2.4 — ABNORMAL HIGH (ref 0.8–1.2)
Prothrombin Time: 25.4 seconds — ABNORMAL HIGH (ref 11.4–15.2)

## 2020-07-02 LAB — MAGNESIUM: Magnesium: 2 mg/dL (ref 1.7–2.4)

## 2020-07-02 LAB — PHOSPHORUS: Phosphorus: 4.2 mg/dL (ref 2.5–4.6)

## 2020-07-02 MED ORDER — FUROSEMIDE 40 MG PO TABS
60.0000 mg | ORAL_TABLET | Freq: Every day | ORAL | Status: DC
Start: 2020-07-02 — End: 2020-07-02

## 2020-07-02 MED ORDER — FUROSEMIDE 40 MG PO TABS
60.0000 mg | ORAL_TABLET | Freq: Every day | ORAL | Status: DC
  Administered 2020-07-03 – 2020-07-05 (×3): 60 mg via ORAL
  Filled 2020-07-02 (×3): qty 1

## 2020-07-02 NOTE — Evaluation (Signed)
Physical Therapy Evaluation Patient Details Name: [name] [name] MRN: [id number] DOB: January 09, 1932 Today's Date: 07/02/2020   History of Present Illness  [name] [name]  is a 86 y.o. female, with history of hypothyroidism, T2DM, Non-ischemic cardiomyopathy, HLD, GERD, CKD, CHF, aortic valve repair, chronic respiratory failure, presents to the ED with dyspnea. Patient reports that it started one week ago, and has been progressively worse since. She has doubled her own lasix dose at home, and reports increased urine output, but the dyspnea has continued. She has associated peripheral edema and 15 pound weight gain. She also reports orthopnea. Patient has no chest pain, no palpitations, fevers, or weakness. On ROS she does report a cough that is occasionally productive of sputum - it has been ongoing. She also reports constipation. No other concerns at this time.    Clinical Impression  Patient limited for functional mobility as stated below secondary to BLE weakness, fatigue and poor standing balance. Patient performs bed mobility with slow, labored movements but does not require physical assist with HOB elevated. Patient demonstrates good sitting balance and sitting tolerance at EOB today. Patient requires mod/max assist and verbal cueing to transfer to standing with RW. Initially upon standing patient requires mod/max assist to remain standing but min A after several minutes. Patient demonstrates fair standing tolerance today. Patient ambulates in room with slow, labored cadence with heavy UE use and unsteady gait. Patient returned to bed at end of session.  Patient will benefit from continued physical therapy in hospital and recommended venue below to increase strength, balance, endurance for safe ADLs and gait.     Follow Up Recommendations SNF    Equipment Recommendations  None recommended by PT    Recommendations for Other Services       Precautions / Restrictions Precautions Precautions:  Fall Restrictions Weight Bearing Restrictions: No      Mobility  Bed Mobility Overal bed mobility: Modified Independent             General bed mobility comments: transitions to EOB without assist with HOB elevated  Transfers Overall transfer level: Needs assistance Equipment used: Rolling walker (2 wheeled) Transfers: Sit to/from [facility] Sit to Stand: Mod assist;Max assist Stand pivot transfers: Mod assist;Max assist       General transfer comment: Patient requires mod/max assist to transfer to standing with use of RW and verbal cueing, requires assist to remain standing initially secondary to weakness  Ambulation/Gait Ambulation/Gait assistance: Mod assist Gait Distance (Feet): 6 Feet Assistive device: Rolling walker (2 wheeled) Gait Pattern/deviations: Shuffle Gait velocity: decreased   General Gait Details: slow, labored cadence with heavy UE use of RW, unsteady  Stairs            Wheelchair Mobility    Modified Rankin (Stroke Patients Only)       Balance Overall balance assessment: Needs assistance Sitting-balance support: Feet supported;No upper extremity supported Sitting balance-Leahy Scale: Good Sitting balance - Comments: seated EOB   Standing balance support: Bilateral upper extremity supported Standing balance-Leahy Scale: Poor Standing balance comment: fair/poor with RW                             Pertinent Vitals/Pain Pain Assessment: Faces Faces Pain Scale: Hurts little more Pain Location: R foot Pain Intervention(s): Limited activity within patient's tolerance;Monitored during session;Repositioned    Home Living Family/patient expects to be discharged to:: Private residence Living Arrangements: Other relatives Available Help at Discharge: Family;Available PRN/intermittently;Personal  care attendant Type of Home: House Home Access: Ramped entrance     Home Layout: One level Home Equipment: [place]  - single point;Walker - 2 wheels;Bedside commode;Shower seat      Prior Function Level of Independence: Needs assistance   Gait / Transfers Assistance Needed: household and short distanced community ambulator using [facility]  ADL's / Homemaking Assistance Needed: home aides 5 days/week from 9:am to 1pm        Hand Dominance        Extremity/Trunk Assessment   Upper Extremity Assessment Upper Extremity Assessment: Generalized weakness    Lower Extremity Assessment Lower Extremity Assessment: Generalized weakness    Cervical / Trunk Assessment Cervical / Trunk Assessment: Kyphotic  Communication   Communication: No difficulties  Cognition   Behavior During Therapy: WFL for tasks assessed/performed Overall Cognitive Status: Within Functional Limits for tasks assessed                                        General Comments      Exercises     Assessment/Plan    PT Assessment Patient needs continued PT services  PT Problem List Decreased strength;Decreased mobility;Decreased activity tolerance;Decreased balance       PT Treatment Interventions DME instruction;Gait training;Stair training;Functional mobility training;Therapeutic activities;Therapeutic exercise;Balance training;Neuromuscular re-education;Patient/family education    PT Goals (Current goals can be found in the Care Plan section)  Acute Rehab PT Goals Patient Stated Goal: return home PT Goal Formulation: With patient Time For Goal Achievement: 07/16/20 Potential to Achieve Goals: Fair    Frequency Min 3X/week   Barriers to discharge        Co-evaluation               AM-PAC PT "6 Clicks" Mobility  Outcome Measure Help needed turning from your back to your side while in a flat bed without using bedrails?: None Help needed moving from lying on your back to sitting on the side of a flat bed without using bedrails?: A Little Help needed moving to and from a bed to a chair  (including a wheelchair)?: A Lot Help needed standing up from a chair using your arms (e.g., wheelchair or bedside chair)?: A Lot Help needed to walk in hospital room?: A Lot Help needed climbing 3-5 steps with a railing? : Total 6 Click Score: 14    End of Session Equipment Utilized During Treatment: Oxygen;Gait belt Activity Tolerance: Patient tolerated treatment well;Patient limited by fatigue Patient left: in bed;with call bell/phone within reach;with bed alarm set Nurse Communication: Mobility status PT Visit Diagnosis: Unsteadiness on feet (R26.81);Other abnormalities of gait and mobility (R26.89);Muscle weakness (generalized) (M62.81)    Time: 8270-7867 PT Time Calculation (min) (ACUTE ONLY): 34 min   Charges:   PT Evaluation $PT Eval Moderate Complexity: 1 Mod PT Treatments $Therapeutic Activity: 23-37 mins        10:18 AM, 07/02/20 Mearl Latin PT, DPT Physical Therapist at [facility]

## 2020-07-02 NOTE — TOC Progression Note (Addendum)
Transition of Care [facility]) - Progression Note    Patient Details  Name: Julane Crock MRN: [id number] Date of Birth: 1932-05-11  [facility] CM/SW Contact  Natasha Bence, LCSW Phone Number: 07/02/2020, 3:15 PM  Clinical Narrative:    CSW received SNF referral CSW discussed SNF referral with patient and patient's niece. Patient's niece reported that she would support SNF for the patient. Patient declined SNF referral and reported that she would like more hours with aid. CSW notified patient that the numbers and hours visits would have to be determined by insurance company and [place] provider. CSW notified Vaughan Basta with [facility], the current [facility] provider. TOC to follow.   Expected Discharge Plan: [place] Barriers to Discharge: Continued Medical Work up  Expected Discharge Plan and Services Expected Discharge Plan: [place] In-house Referral: Clinical Social Work Discharge Planning Services: NA Post Acute Care Choice: [place] arrangements for the past 2 months: [place]: [place] ([facility]) Date [place]: 06/30/20 Time [place]: [place] Representative spoke with at [place]: [place] ([facility]) Interventions    Readmission Risk Interventions Readmission Risk Prevention Plan 04/01/2020 04/01/2020 02/03/2020  Transportation Screening Complete Complete Complete  PCP or Specialist Appt within 3-5 Days - - -  [place]RI or [place] for [place] - - -  Medication Review Press photographer) Complete - Referral to Pharmacy  PCP or Specialist appointment within 3-5 days of discharge - - Complete  HRI or Home Care Consult Complete Complete Complete  SW Recovery Care/Counseling Consult Complete - Complete  Palliative Care Screening Not  Applicable Not Applicable Not [place] Not Applicable Not Applicable Not Applicable  Some recent data might be hidden

## 2020-07-02 NOTE — Progress Notes (Signed)
PROGRESS NOTE    [name] [name]  ZOX:[id number] DOB: 03-Jan-1932 DOA: 06/29/2020 PCP: Fayrene Helper, MD   Brief Narrative:  This 86 years old female with history of hypothyroidism,  type 2 diabetes, nonischemic cardiomyopathy, hyperlipidemia, GERD, CKD stage III, aortic valve repair, CHB, s.p PPM, Persistent Atrial fibrillation , Chronic respiratory failure presented in the ED with worsening shortness of breath.  Patient reported doubling her dose of Lasix with increased urine output but had worsening shortness of breath.  Denies any chest pain. Patient is admitted for acute on chronic systolic CHF exacerbation. She is clinically  Improving ,4.2 L negative balance since admission, Her serum creatinine increased slightly, will switch to PO lasix tomorrow.  Assessment & Plan:   Active Problems:   CHF exacerbation ([place])  1. Acute on chronic combined systolic and diastolic CHF exacerbation:  Presents with orthopnea, weight gain and worsening SOB, pedal edema.  CXR consistent with with pulm edema,  BNP 569, Cardiomegaly on CXR.  Requiring 3L [place] when baseline is 2L [place]  Recent Echo 01/2020 = EF 30-35%.  Continue lasix, daily weights, Intake and output, low sodium diet, and fluid restrictions  No ACE/ARB 2/2 renal insufficiency.  Continue beta blocker. Continue Hydralazine and Imdur,  Cardiology consulted, recommended continue IV diuresis.    She is clinically  improving , 4.2 L negative balance since admission,     Her serum creatinine increased slightly, will switch to PO lasix tomorrow.  2. Elevated troponin Trop 28 - 31 In the setting of renal insufficiency.  Likely demand ischemia. She denies any chest pain, palpitations, dizzziness.  3. Renal insufficiency CKD stage IV Cr baseline is 1.4 - 1.5. Today 1.87 Could be Cardiorenal syndrome. Continue lasix 60 mg iv bid. Will switch to lasix 60 po daily tomorrow. Re-check in the AM ,   4. HTN:  Continue home  medications.  5. Hypothyroidism: Continue synthroid.   6.: Persistent Atrial fibrillation: Heart rate remains controlled, denies any palpitations. continue Toprol-XL 100 mg daily, continue Coumadin for anticoagulation.  7. CHB, S/P PPM: Recently interrogated in August 2081 shows normal device function.  8.  Valvular heart disease: S/p mechanical AVR in 2001 and mechanical MVR in 2004 Recent echo shows no significant valve abnormalities continue Coumadin.  9. COPD: not in exacerbation. Continue supplemental oxygen @ 3L/ m  DVT prophylaxis: Coumadin Code Status: DNR Family Communication: No one at bed side. Disposition Plan:   Status is: Inpatient  Remains inpatient appropriate because:Inpatient level of care appropriate due to severity of illness   Dispo: The patient is from: Home              Anticipated d/c is to: SNF              Anticipated d/c date is: 1 day              Patient currently is not medically stable to d/c.  Consultants:   Cardiology  Procedures: None.  Antimicrobials:  Anti-infectives (From admission, onward)   None     Subjective: Patient was seen and examined at bedside.  Overnight events noted.  She reports feeling better. She still has significant pedal edema but denies any shortness of breath. She has participated in physical therapy and recommended skilled nursing facility.  Objective: Vitals:   07/02/20 0435 07/02/20 0723 07/02/20 0842 07/02/20 0842  BP: 124/75  131/64 131/64  Pulse: 60  60 (!) 58  Resp: 18   18  Temp: 98 F (36.7 C)  TempSrc: Oral     SpO2: 98% 98%  94%  Weight:      Height:        Intake/Output Summary (Last 24 hours) at 07/02/2020 1420 Last data filed at 07/02/2020 1300 Gross per 24 hour  Intake 723 ml  Output 1650 ml  Net -927 ml   Filed Weights   06/29/20 1947 07/01/20 0633  Weight: 93 kg 85.8 kg    Examination:  General exam: Appears calm and comfortable.  Respiratory system: Clear  auscultation. Respiratory effort normal. Cardiovascular system: S1 & S2 heard, RRR. No JVD, murmurs, rubs, gallops or clicks. No pedal edema. Gastrointestinal system: Abdomen is nondistended, soft and nontender. No organomegaly or masses felt. Normal bowel sounds heard. Central nervous system: Alert and oriented. No focal neurological deficits. Extremities: pedal edema++, no cyanosis, no clubbing Skin: No rashes, lesions or ulcers Psychiatry: Judgement and insight appear normal. Mood & affect appropriate.     Data Reviewed: I have personally reviewed following labs and imaging studies  CBC: Recent Labs  Lab 06/29/20 2002 06/30/20 0500 07/01/20 0545 07/01/20 1524 07/02/20 0648  WBC 7.8 6.5 5.8  --  5.5  NEUTROABS 6.9 5.4  --   --   --   HGB 9.9* 9.0* 8.8* 8.9* 8.6*  HCT 32.0* 29.6* 28.9* 29.1* 28.4*  MCV 94.4 95.5 93.8  --  94.7  PLT 210 197 203  --  283   Basic Metabolic Panel: Recent Labs  Lab 06/29/20 2002 06/30/20 0500 07/01/20 0545 07/02/20 0648  NA 134* 137 138 138  K 5.2* 4.6 4.0 4.2  CL 96* 94* 93* 93*  CO2 28 34* 34* 36*  GLUCOSE 116* 105* 81 94  BUN 59* 57* 55* 60*  CREATININE 1.70* 1.73* 1.45* 1.87*  CALCIUM 9.1 9.1 9.3 8.8*  MG  --  2.1 2.0 2.0  PHOS  --   --  3.3 4.2   GFR: Estimated Creatinine Clearance: 24.3 mL/min (A) (by C-G formula based on SCr of 1.87 mg/dL (H)). Liver Function Tests: Recent Labs  Lab 06/29/20 2002 06/30/20 0500 07/01/20 0545  AST 34 23 22  ALT 18 16 13   ALKPHOS 61 53 50  BILITOT 0.4 0.6 0.9  PROT 7.9 7.4 6.9  ALBUMIN 3.8 3.3* 3.2*   No results for input(s): LIPASE, AMYLASE in the last 168 hours. No results for input(s): AMMONIA in the last 168 hours. Coagulation Profile: Recent Labs  Lab 06/30/20 0500 07/01/20 0545 07/02/20 0648  INR 2.9* 2.5* 2.4*   Cardiac Enzymes: No results for input(s): CKTOTAL, CKMB, CKMBINDEX, TROPONINI in the last 168 hours. BNP (last 3 results) No results for input(s): PROBNP in the  last 8760 hours. HbA1C: No results for input(s): HGBA1C in the last 72 hours. CBG: No results for input(s): GLUCAP in the last 168 hours. Lipid Profile: No results for input(s): CHOL, HDL, LDLCALC, TRIG, CHOLHDL, LDLDIRECT in the last 72 hours. Thyroid Function Tests: No results for input(s): TSH, T4TOTAL, FREET4, T3FREE, THYROIDAB in the last 72 hours. Anemia Panel: No results for input(s): VITAMINB12, FOLATE, FERRITIN, TIBC, IRON, RETICCTPCT in the last 72 hours. Sepsis Labs: No results for input(s): PROCALCITON, LATICACIDVEN in the last 168 hours.  Recent Results (from the past 240 hour(s))  Respiratory Panel by RT PCR (Flu A&B, Covid) - Nasopharyngeal Swab     Status: None   Collection Time: 06/29/20  7:57 PM   Specimen: Nasopharyngeal Swab  Result Value Ref Range Status   SARS Coronavirus 2 by RT PCR  NEGATIVE NEGATIVE Final    Comment: (NOTE) SARS-CoV-2 target nucleic acids are NOT DETECTED.  The SARS-CoV-2 RNA is generally detectable in upper respiratoy specimens during the acute phase of infection. The lowest concentration of SARS-CoV-2 viral copies this assay can detect is 131 copies/mL. A negative result does not preclude SARS-Cov-2 infection and should not be used as the sole basis for treatment or other patient management decisions. A negative result may occur with  improper specimen collection/handling, submission of specimen other than nasopharyngeal swab, presence of viral mutation(s) within the areas targeted by this assay, and inadequate number of viral copies (<131 copies/mL). A negative result must be combined with clinical observations, patient history, and epidemiological information. The expected result is Negative.  Fact Sheet for Patients:  PinkCheek.be  Fact Sheet for Healthcare Providers:  GravelBags.it  This test is no t yet approved or cleared by the [place] FDA and  has been  authorized for detection and/or diagnosis of SARS-CoV-2 by FDA under an Emergency Use Authorization (EUA). This EUA will remain  in effect (meaning this test can be used) for the duration of the COVID-19 declaration under Section 564(b)(1) of the Act, 21 U.S.C. section 360bbb-3(b)(1), unless the authorization is terminated or revoked sooner.     Influenza A by PCR NEGATIVE NEGATIVE Final   Influenza B by PCR NEGATIVE NEGATIVE Final    Comment: (NOTE) The Xpert Xpress SARS-CoV-2/FLU/RSV assay is intended as an aid in  the diagnosis of influenza from Nasopharyngeal swab specimens and  should not be used as a sole basis for treatment. Nasal washings and  aspirates are unacceptable for Xpert Xpress SARS-CoV-2/FLU/RSV  testing.  Fact Sheet for Patients: PinkCheek.be  Fact Sheet for Healthcare Providers: GravelBags.it  This test is not yet approved or cleared by the [place] FDA and  has been authorized for detection and/or diagnosis of SARS-CoV-2 by  FDA under an Emergency Use Authorization (EUA). This EUA will remain  in effect (meaning this test can be used) for the duration of the  Covid-19 declaration under Section 564(b)(1) of the Act, 21  U.S.C. section 360bbb-3(b)(1), unless the authorization is  terminated or revoked. Performed at [facility], [address], [place], [place] [address]      Radiology Studies: No results found.  Scheduled Meds: . fluticasone  1 spray Each Nare Daily  . fluticasone furoate-vilanterol  1 puff Inhalation Daily  . folic acid  1 mg Oral Daily  . [START ON 07/03/2020] furosemide  60 mg Oral Daily  . gabapentin  300 mg Oral BID  . hydrALAZINE  25 mg Oral BID  . isosorbide mononitrate  15 mg Oral q morning - 10a  . levothyroxine  75 mcg Oral QAC breakfast  . metoprolol succinate  100 mg Oral Daily  . pravastatin  40 mg Oral Daily  . sodium chloride flush  3 mL Intravenous Q12H  .  warfarin  10 mg Oral Once per day on Sun Mon Wed Thu Sat  . [START ON 07/06/2020] warfarin  5 mg Oral Once per day on Tue  . Warfarin - Pharmacist Dosing Inpatient   Does not apply q1600   Continuous Infusions: . sodium chloride       LOS: 2 days    Time spent: 25 mins.    Shawna Clamp, MD [facility]   If 7PM-7AM, please contact night-coverage

## 2020-07-02 NOTE — Progress Notes (Addendum)
Progress Note  Patient Name: [name] [name] Date of Encounter: 07/02/2020  Primary Cardiologist: Sanda Klein, MD   Subjective   Breathing at baseline. No chest pain or palpitations. She has not had a bowel movement since admission and feels constipated. Has not been out of bed since admission.   Inpatient Medications    Scheduled Meds: . fluticasone  1 spray Each Nare Daily  . fluticasone furoate-vilanterol  1 puff Inhalation Daily  . folic acid  1 mg Oral Daily  . furosemide  60 mg Intravenous Q12H  . gabapentin  300 mg Oral BID  . hydrALAZINE  25 mg Oral BID  . isosorbide mononitrate  15 mg Oral q morning - 10a  . levothyroxine  75 mcg Oral QAC breakfast  . metoprolol succinate  100 mg Oral Daily  . pravastatin  40 mg Oral Daily  . sodium chloride flush  3 mL Intravenous Q12H  . warfarin  10 mg Oral Once per day on Sun Mon Wed Thu Sat  . [START ON 07/06/2020] warfarin  5 mg Oral Once per day on Tue  . Warfarin - Pharmacist Dosing Inpatient   Does not apply q1600   Continuous Infusions: . sodium chloride     PRN Meds: sodium chloride, acetaminophen, albuterol, ondansetron (ZOFRAN) IV, polyethylene glycol powder, sodium chloride flush   Vital Signs    Vitals:   07/01/20 1434 07/01/20 2008 07/02/20 0435 07/02/20 0723  BP: 136/61 125/60 124/75   Pulse: (!) 59 60 60   Resp: 16 20 18    Temp: 98.6 F (37 C) 98.5 F (36.9 C) 98 F (36.7 C)   TempSrc: Oral Oral Oral   SpO2: 99% 99% 98% 98%  Weight:      Height:        Intake/Output Summary (Last 24 hours) at 07/02/2020 0752 Last data filed at 07/02/2020 0518 Gross per 24 hour  Intake 720 ml  Output 1200 ml  Net -480 ml    Last 3 Weights 07/01/2020 06/29/2020 06/29/2020  Weight (lbs) 189 lb 2.5 oz 205 lb 205 lb  Weight (kg) 85.8 kg 92.987 kg 92.987 kg      Telemetry    V-paced, HR in 60's.  - Personally Reviewed  ECG    No new tracings.   Physical Exam   General: Well developed, elderly female  appearing in no acute distress. Head: Normocephalic, atraumatic.  Neck: Supple without bruits, JVD not elevated. Lungs:  Resp regular and unlabored, CTA without wheezing or rales Heart: Irregularly irregular, S1, S2, no S3, S4, or murmur; no rub. Abdomen: Soft, non-tender, non-distended with normoactive bowel sounds. No hepatomegaly. No rebound/guarding. No obvious abdominal masses. Extremities: No clubbing or cyanosis, trace lower extremity edema bilaterally. Distal pedal pulses are 2+ bilaterally. Neuro: Alert and oriented X 3. Moves all extremities spontaneously. Psych: Normal affect.  Labs    Chemistry Recent Labs  Lab 06/29/20 2002 06/30/20 0500 07/01/20 0545  NA 134* 137 138  K 5.2* 4.6 4.0  CL 96* 94* 93*  CO2 28 34* 34*  GLUCOSE 116* 105* 81  BUN 59* 57* 55*  CREATININE 1.70* 1.73* 1.45*  CALCIUM 9.1 9.1 9.3  PROT 7.9 7.4 6.9  ALBUMIN 3.8 3.3* 3.2*  AST 34 23 22  ALT 18 16 13   ALKPHOS 61 53 50  BILITOT 0.4 0.6 0.9  GFRNONAA 27* 26* 33*  ANIONGAP 10 9 11      Hematology Recent Labs  Lab 06/29/20 2002 06/29/20 2002 06/30/20 0500 07/01/20 0545  07/01/20 1524  WBC 7.8  --  6.5 5.8  --   RBC 3.39*  --  3.10* 3.08*  --   HGB 9.9*   < > 9.0* 8.8* 8.9*  HCT 32.0*   < > 29.6* 28.9* 29.1*  MCV 94.4  --  95.5 93.8  --   MCH 29.2  --  29.0 28.6  --   MCHC 30.9  --  30.4 30.4  --   RDW 17.2*  --  17.2* 17.2*  --   PLT 210  --  197 203  --    < > = values in this interval not displayed.    Cardiac EnzymesNo results for input(s): TROPONINI in the last 168 hours. No results for input(s): TROPIPOC in the last 168 hours.   BNP Recent Labs  Lab 06/29/20 2002  BNP 569.0*     DDimer No results for input(s): DDIMER in the last 168 hours.   Radiology    No results found.  Cardiac Studies   Echocardiogram: 01/2020 IMPRESSIONS    1. Left ventricular ejection fraction, by estimation, is 30 to 35%. The  left ventricle has moderately decreased function. The  left ventricle  demonstrates global hypokinesis. There is mild concentric left ventricular  hypertrophy. Left ventricular  diastolic function could not be evaluated.  2. Right ventricular systolic function is normal. The right ventricular  size is normal. There is severely elevated pulmonary artery systolic  pressure. The estimated right ventricular systolic pressure is 45.4 mmHg.  3. Left atrial size was severely dilated.  4. Right atrial size was moderately dilated.  5. The mitral valve has been repaired/replaced. No evidence of mitral  valve regurgitation.  6. The aortic valve has been repaired/replaced. Aortic valve  regurgitation is not visualized.    Patient Profile     86 y.o. female w/PMH of CHB (s/p Medtronic BiV PPMin 01/2019), persistent atrial fibrillation/flutter, chronic combined systolic and diastolic CHF (EF previously as low as 20-25%, normalized by repeat echo in 2019 and 02/2019, EF 30-35% by echocardiogram in 01/2020), mechanical AVR in 2001 and mechanical MVR in 2004, HTN, HLD, Type 2 DM, COPDand Stage 3 CKD who is currently admitted for an acute CHF exacerbation.   Assessment & Plan    1. Acute on Chronic Combined Systolic and Diastolic CHF - She has a known reducedEFof30-35% by echocardiogram in 01/2020 (from study review by Dr. Sallyanne Kuster and Dr. Harl Bowie has looked to be closer to 50%). She was prescribed Lasix 60mg  daily as an outpatient but was typically only taking 40mg  daily due to frequent urination.  - Continues to receive IV Lasix 60mg  BID with a recorded output of -4.2 L (another 800 mL in urine canister) and weight was recorded at 189 lbs on 10/7 (previous baseline weight of 191 - 193 lbs). Will ask for a standing weight to be obtained today. Her volume status is close to baseline. Would anticipate switching to PO Lasix 60mg  daily at the time of discharge and the importance of compliance with this was reviewed with the patient.  - Continue Toprol-XL,  Hydralazine and Imdur. She is not on ACE-I/ARB/ARNI due to variable renal function.   2. CHB - She iss/p Medtronic BiV PPMin 01/2019. Followed by Dr. Sallyanne Kuster as an outpatient.   3. Persistent atrial fibrillation/flutter - V-paced with HR in the 60's by review of telemetry. Continue Toprol-XL at current dosing. She is on Coumadin for anticoagulation.  4. Valvular Heart Disease - She is s/p mechanical AVR in  2001 and mechanical MVR in 2004. No significant valve abnormalities by echo in 01/2020. Repeat INR pending for this AM.   5. HTN - BP has stable at 124/75 - 136/61 within the past 24 hours. Continue current medication regimen.  6. Stage 3 CKD - Baseline creatinine 1.4 - 1.5. At 1.73 on admission, improved to 1.45 on 10/7. Repeat BMET pending for this AM.   7. COPD - On 2L at baseline at baseline but has required 3L this admission. Saturations have remained appropriate. Will ask nursing staff to wean to her prior 2L.   8. Peripheral Neuropathy - She has been restarted on Gabapentin 300mg  BID. Was on this as an outpatient.    From a cardiac standpoint, her volume status has significantly improved. Will ask for a standing weight to be obtained today but can likely transition back to PO Lasix 60mg  daily. Will ask for PT evaluation as she has not been out of bed in 3 days and lives by herself. Will arrange outpatient Cardiology follow-up.    For questions or updates, please contact [place] Please consult [email or web address] for contact info under Cardiology/STEMI.   Arna Medici , PA-C 7:52 AM 07/02/2020 Pager: [phone]   Attending note:  Hospital course reviewed, case discussed with [name] [name], I agree with her above findings and documentation.  [name] [name] has shown improvement in overall volume status and I agree with switching to oral Lasix 60 mg daily.  Creatinine has bumped up from 1.45-1.87 with IV diuresis and the we will therefore make  switch to oral Lasix beginning tomorrow and stop her IV Lasix for this evening.  Heart rate is in the 60s with ventricular pacing.  She remains on Toprol-XL and Coumadin with history of atrial fibrillation/flutter.  INR is 2.4.  She also has a mechanical AVR in place.  Her cardiac regimen includes hydralazine, Imdur, and Pravachol.  She is not on ARB or ACE inhibitor due to fluctuating renal insufficiency.  Increase activity with PT evaluation as suggested.  She lives alone.  Satira Sark, M.D., F.A.C.C.

## 2020-07-02 NOTE — Plan of Care (Signed)
  Problem: Acute Rehab PT Goals(only PT should resolve) Goal: Patient Will Transfer Sit To/From Stand Outcome: Progressing Flowsheets (Taken 07/02/2020 1024) Patient will transfer sit to/from stand: with minimal assist Goal: Pt Will Transfer Bed To Chair/Chair To Bed Outcome: Progressing Flowsheets (Taken 07/02/2020 1024) Pt will Transfer Bed to Chair/Chair to Bed: with min assist Goal: Pt Will Ambulate Outcome: Progressing Flowsheets (Taken 07/02/2020 1024) Pt will Ambulate:  25 feet  with minimal assist  with rolling walker  10:24 AM, 07/02/20 Mearl Latin PT, DPT Physical Therapist at [facility]

## 2020-07-02 NOTE — Care Management Important Message (Signed)
Important Message  Patient Details  Name: [name] [name] MRN: [id number] Date of Birth: 1932-03-29   Medicare Important Message Given:  Yes     Tommy Medal 07/02/2020, 4:16 PM

## 2020-07-03 ENCOUNTER — Inpatient Hospital Stay (HOSPITAL_COMMUNITY): Payer: Medicare Other

## 2020-07-03 LAB — COMPREHENSIVE METABOLIC PANEL
ALT: 19 U/L (ref 0–44)
AST: 43 U/L — ABNORMAL HIGH (ref 15–41)
Albumin: 3 g/dL — ABNORMAL LOW (ref 3.5–5.0)
Alkaline Phosphatase: 57 U/L (ref 38–126)
Anion gap: 17 — ABNORMAL HIGH (ref 5–15)
BUN: 55 mg/dL — ABNORMAL HIGH (ref 8–23)
CO2: 27 mmol/L (ref 22–32)
Calcium: 8.6 mg/dL — ABNORMAL LOW (ref 8.9–10.3)
Chloride: 94 mmol/L — ABNORMAL LOW (ref 98–111)
Creatinine, Ser: 1.79 mg/dL — ABNORMAL HIGH (ref 0.44–1.00)
GFR, Estimated: 25 mL/min — ABNORMAL LOW (ref >60)
Glucose, Bld: 225 mg/dL — ABNORMAL HIGH (ref 70–99)
Potassium: 4 mmol/L (ref 3.5–5.1)
Sodium: 138 mmol/L (ref 135–145)
Total Bilirubin: 0.6 mg/dL (ref 0.3–1.2)
Total Protein: 6.9 g/dL (ref 6.5–8.1)

## 2020-07-03 LAB — PHOSPHORUS
Phosphorus: 3.4 mg/dL (ref 2.5–4.6)
Phosphorus: 5.2 mg/dL — ABNORMAL HIGH (ref 2.5–4.6)

## 2020-07-03 LAB — BASIC METABOLIC PANEL
Anion gap: 9 (ref 5–15)
BUN: 61 mg/dL — ABNORMAL HIGH (ref 8–23)
CO2: 35 mmol/L — ABNORMAL HIGH (ref 22–32)
Calcium: 8.7 mg/dL — ABNORMAL LOW (ref 8.9–10.3)
Chloride: 94 mmol/L — ABNORMAL LOW (ref 98–111)
Creatinine, Ser: 1.69 mg/dL — ABNORMAL HIGH (ref 0.44–1.00)
GFR, Estimated: 27 mL/min — ABNORMAL LOW (ref >60)
Glucose, Bld: 91 mg/dL (ref 70–99)
Potassium: 3.8 mmol/L (ref 3.5–5.1)
Sodium: 138 mmol/L (ref 135–145)

## 2020-07-03 LAB — GLUCOSE, CAPILLARY
Glucose-Capillary: 214 mg/dL — ABNORMAL HIGH (ref 70–99)
Glucose-Capillary: 102 mg/dL — ABNORMAL HIGH (ref 70–99)

## 2020-07-03 LAB — CBC
HCT: 29.4 % — ABNORMAL LOW (ref 36.0–46.0)
HCT: 32.3 % — ABNORMAL LOW (ref 36.0–46.0)
Hemoglobin: 9 g/dL — ABNORMAL LOW (ref 12.0–15.0)
Hemoglobin: 9.7 g/dL — ABNORMAL LOW (ref 12.0–15.0)
MCH: 28.8 pg (ref 26.0–34.0)
MCH: 29.5 pg (ref 26.0–34.0)
MCHC: 30.6 g/dL (ref 30.0–36.0)
MCHC: 30 g/dL (ref 30.0–36.0)
MCV: 93.9 fL (ref 80.0–100.0)
MCV: 98.2 fL (ref 80.0–100.0)
Platelets: 175 10*3/uL (ref 150–400)
Platelets: 205 10*3/uL (ref 150–400)
RBC: 3.13 MIL/uL — ABNORMAL LOW (ref 3.87–5.11)
RBC: 3.29 MIL/uL — ABNORMAL LOW (ref 3.87–5.11)
RDW: 17 % — ABNORMAL HIGH (ref 11.5–15.5)
RDW: 17 % — ABNORMAL HIGH (ref 11.5–15.5)
WBC: 4.9 10*3/uL (ref 4.0–10.5)
WBC: 9 10*3/uL (ref 4.0–10.5)
nRBC: 0 % (ref 0.0–0.2)
nRBC: 0 % (ref 0.0–0.2)

## 2020-07-03 LAB — LACTIC ACID, PLASMA
Lactic Acid, Venous: 9.7 mmol/L (ref 0.5–1.9)
Lactic Acid, Venous: 4.5 mmol/L (ref 0.5–1.9)

## 2020-07-03 LAB — MAGNESIUM
Magnesium: 2 mg/dL (ref 1.7–2.4)
Magnesium: 2.2 mg/dL (ref 1.7–2.4)

## 2020-07-03 LAB — TROPONIN I (HIGH SENSITIVITY)
Troponin I (High Sensitivity): 55 ng/L — ABNORMAL HIGH (ref <18)
Troponin I (High Sensitivity): 146 ng/L (ref <18)

## 2020-07-03 LAB — PROTIME-INR
INR: 2.2 — ABNORMAL HIGH (ref 0.8–1.2)
Prothrombin Time: 23.6 seconds — ABNORMAL HIGH (ref 11.4–15.2)

## 2020-07-03 MED ORDER — LORAZEPAM 2 MG/ML IJ SOLN
INTRAMUSCULAR | Status: AC
  Filled 2020-07-03: qty 1

## 2020-07-03 MED ORDER — FUROSEMIDE 20 MG PO TABS
60.0000 mg | ORAL_TABLET | Freq: Every day | ORAL | 1 refills | Status: DC
Start: 2020-07-04 — End: 2020-07-03

## 2020-07-03 MED ORDER — FUROSEMIDE 40 MG PO TABS
60.0000 mg | ORAL_TABLET | Freq: Every day | ORAL | 1 refills | Status: DC
Start: 2020-07-04 — End: 2020-07-08

## 2020-07-03 MED ORDER — LORAZEPAM 2 MG/ML IJ SOLN
2.0000 mg | Freq: Once | INTRAMUSCULAR | Status: AC
  Administered 2020-07-03: 1 mg via INTRAVENOUS

## 2020-07-03 MED ORDER — WARFARIN SODIUM 5 MG PO TABS: 10.0000 mg | ORAL_TABLET | ORAL | Status: DC

## 2020-07-03 MED ORDER — CHLORHEXIDINE GLUCONATE CLOTH 2 % EX PADS
6.0000 | MEDICATED_PAD | Freq: Every day | CUTANEOUS | Status: DC
  Administered 2020-07-03 – 2020-07-08 (×5): 6 via TOPICAL

## 2020-07-03 MED ORDER — LACTATED RINGERS IV BOLUS
500.0000 mL | Freq: Once | INTRAVENOUS | Status: AC
  Administered 2020-07-03: 500 mL via INTRAVENOUS

## 2020-07-03 NOTE — Progress Notes (Signed)
Patient moved to stepdown per MD order. Report given to receiving nurse.

## 2020-07-03 NOTE — Discharge Summary (Signed)
Physician Discharge Summary  [name] [name] IRC:[id number] DOB: 1932/02/24 DOA: 06/29/2020  PCP: Fayrene Helper, MD  Admit date: 06/29/2020.  Discharge date: 07/03/2020.  Admitted From:  Home.  Disposition: Home with home services.  Recommendations for Outpatient Follow-up:  1. Follow up with PCP in 1-2 weeks 2. Please obtain BMP/CBC in one week. 3. Advised to follow up Cardiology as scheduled. 4. Advised to take lasix 60 mg po daily.  Home Health: Yes Equipment/Devices: Home PT/OT  Discharge Condition: Stable CODE STATUS:DNR Diet recommendation: Heart Healthy   Brief Summary / Hospital course: This 86 years old female with history of hypothyroidism,  type 2 diabetes, nonischemic cardiomyopathy,  CHF ( EF 35-40%) Hyperlipidemia, GERD, CKD stage III, Aortic valve repair, CHB, s/p PPM, Persistent Atrial fibrillation , Chronic respiratory failure presented in the ED with worsening shortness of breath.  Patient reported doubling her dose of Lasix with increased urine output but still had worsening shortness of breath.  Denies any chest pain. Patient was admitted for acute on chronic systolic CHF exacerbation.  Cardiology was consulted recommended IV diuresis for few days.  Patient had made clinical improvement so far 5 L negative balance since admission.  Her serum creatinine has slightly increased, Lasix changed to p.o. Lasix 60 mg daily.  Patient is on appropriate medications hydralazine and Imdur because she cannot be on ACE inhibitors and ARB because of renal insufficiency.  Patient cleared from cardiology to be discharged.  PT recommended a skilled nursing facility but patient has declined,  she wants to go home.  Home health services been arranged.  Patient is being discharged home and she will follow up with cardiology as an outpatient.  She was managed for below problems.  Discharge Diagnoses:  Active Problems:   CHF exacerbation ([place])  1. Acute on chronic combined systolic  and diastolic CHF exacerbation:  Presents with orthopnea, weight gain and worsening SOB, pedal edema.  CXR consistent with with pulm edema,  BNP 569, Cardiomegaly on CXR.  Requiring 3L [place] when baseline is 2L [place]  Recent Echo 01/2020 = EF 30-35%.  Continue lasix, daily weights, Intake and output, low sodium diet, and fluid restrictions  No ACE/ARB 2/2 renal insufficiency.  Continue beta blocker. Continue Hydralazine and Imdur,  Cardiology consulted, recommended continue IV diuresis.    She is clinically  improving , 5 L negative balance since admission,     Her serum creatinine increased slightly, will switch to PO lasix tomorrow.  2. Elevated troponin Trop 28 - 31 In the setting of renal insufficiency.  Likely demand ischemia. She denies any chest pain, palpitations, dizzziness.  3. Renal insufficiency CKD stage IV Cr baseline is 1.4 - 1.5. Today 1.87 Could be Cardiorenal syndrome. Continue lasix 60 mg iv bid. Will switch to lasix 60 po daily at discharge Re-check in the AM ,   4. HTN: Continue home medications.  5. Hypothyroidism: Continue synthroid.   6.: Persistent Atrial fibrillation: Heart rate remains controlled, denies any palpitations. continue Toprol-XL 100 mg daily, continue Coumadin for anticoagulation.  7. CHB, S/P PPM: Recently interrogated in August 2081 shows normal device function.  8.  Valvular heart disease: S/p mechanical AVR in 2001 and mechanical MVR in 2004 Recent echo shows no significant valve abnormalities continue Coumadin.  9. COPD: not in exacerbation. Continue supplemental oxygen @ 3L/ m   Discharge Instructions  Discharge Instructions    Call MD for:  difficulty breathing, headache or visual disturbances   Complete by: As directed  Call MD for:  persistant dizziness or light-headedness   Complete by: As directed    Call MD for:  persistant nausea and vomiting   Complete by: As directed    Diet - low sodium heart healthy    Complete by: As directed    Diet - low sodium heart healthy   Complete by: As directed    Diet Carb Modified   Complete by: As directed    Discharge instructions   Complete by: As directed    Advised to follow-up with primary care physician in 1 week. Advised to follow-up with cardiology as scheduled. Patient has been advised to take Lasix 60 mg p.o. daily. Home health services has been arranged for the patient.   Increase activity slowly   Complete by: As directed    Increase activity slowly   Complete by: As directed      Allergies as of 07/03/2020      Reactions   Penicillins Other (See Comments)   Bruise   Aspirin Other (See Comments)   On coumadin   Fentanyl Dermatitis   Rash with patch   Niacin Itching      Medication List    TAKE these medications   acetaminophen 500 MG tablet Commonly known as: TYLENOL Take 500 mg by mouth every 6 (six) hours as needed (pain).   albuterol (2.5 MG/3ML) 0.083% nebulizer solution Commonly known as: PROVENTIL Take 3 mLs (2.5 mg total) by nebulization in the morning and at bedtime. *May use additional one time if needed for shortness of breath   albuterol 108 (90 Base) MCG/ACT inhaler Commonly known as: VENTOLIN HFA Inhale 1-2 puffs into the lungs every 6 (six) hours as needed for wheezing or shortness of breath.   allopurinol 100 MG tablet Commonly known as: ZYLOPRIM Take 1 tablet (100 mg total) by mouth in the morning.   benzonatate 100 MG capsule Commonly known as: TESSALON Take 1 capsule (100 mg total) by mouth 2 (two) times daily as needed for cough.   Breo Ellipta 100-25 MCG/INH Aepb Generic drug: fluticasone furoate-vilanterol 1 TAKE 1 INHALATION BY MOUTH ONCE DAILY   cholecalciferol 25 MCG (1000 UNIT) tablet Commonly known as: VITAMIN D3 Take 1,000 Units by mouth in the morning.   docusate sodium 100 MG capsule Commonly known as: COLACE Take 300 mg by mouth at bedtime.   fluticasone 50 MCG/ACT nasal  spray Commonly known as: FLONASE SHAKE LIQUID AND USE 2 SPRAYS IN EACH NOSTRIL DAILY   folic acid 1 MG tablet Commonly known as: FOLVITE TAKE 1 TABLET(1 MG) BY MOUTH DAILY   furosemide 40 MG tablet Commonly known as: LASIX Take 1.5 tablets (60 mg total) by mouth daily. Start taking on: July 04, 2020 What changed:   how much to take  additional instructions   gabapentin 300 MG capsule Commonly known as: NEURONTIN TAKE 1 CAPSULE(300 MG) BY MOUTH TWICE DAILY What changed: See the new instructions.   hydrALAZINE 25 MG tablet Commonly known as: APRESOLINE Take 1 tablet (25 mg total) by mouth 2 (two) times daily.   isosorbide mononitrate 30 MG 24 hr tablet Commonly known as: IMDUR Take 0.5 tablets (15 mg total) by mouth every morning.   meclizine 12.5 MG tablet Commonly known as: ANTIVERT TAKE 1 TABLET(12.5 MG) BY MOUTH THREE TIMES DAILY AS NEEDED FOR DIZZINESS What changed: See the new instructions.   metolazone 2.5 MG tablet Commonly known as: ZAROXOLYN Take one tablet on Saturdays. What changed:   how much to take  how to take this  when to take this   metoprolol succinate 100 MG 24 hr tablet Commonly known as: TOPROL-XL TAKE 1 TABLET BY MOUTH EVERY DAY WITH FOOD OR MILK What changed:   how much to take  how to take this  when to take this   nitroGLYCERIN 0.4 MG SL tablet Commonly known as: NITROSTAT Place 1 tablet (0.4 mg total) under the tongue every 5 (five) minutes as needed for chest pain.   ondansetron 4 MG disintegrating tablet Commonly known as: Zofran ODT Take 1 tablet (4 mg total) by mouth every 8 (eight) hours as needed for nausea or vomiting.   polyethylene glycol powder 17 GM/SCOOP powder Commonly known as: GLYCOLAX/MIRALAX Take 17 g by mouth daily as needed for mild constipation or moderate constipation.   potassium chloride SA 20 MEQ tablet Commonly known as: KLOR-CON Take 1 tablet (20 mEq total) by mouth daily. TAKE 40MEQ DAILY  x3 DAYS THEN BACK TO 20MEQ DAILY What changed: additional instructions   pravastatin 40 MG tablet Commonly known as: PRAVACHOL TAKE 1 TABLET BY MOUTH EVERY EVENING What changed: when to take this   SALONPAS PAIN RELIEF PATCH EX Apply 1 patch topically daily as needed (pain).   Synthroid 75 MCG tablet Generic drug: levothyroxine Take 1 tablet (75 mcg total) by mouth daily before breakfast.   Systane 0.4-0.3 % Gel ophthalmic gel Generic drug: Polyethyl [name] Place 1 application into the left eye at bedtime.   warfarin 5 MG tablet Commonly known as: Jantoven Take as directed. If you are unsure how to take this medication, talk to your nurse or doctor. Original instructions: Take 1-2 tablets (5-10 mg total) by mouth See admin instructions. TAKE 2 TABLETS EVERY DAY EXCEPT 1 TABLET ON TUESDAYS @ 8:30PM       Follow-up Information    Deberah Pelton, NP Follow up on 07/23/2020.   Specialty: Cardiology Why: [facility]iology [facility] Follow-up on 07/23/2020 at 11:45 AM with Coletta Memos, NP (Works with Dr. Sallyanne Kuster) Contact information: [address] [place] [address] [phone]        Fayrene Helper, MD Follow up in 1 week(s).   Specialty: Family Medicine Contact information: [address], [place] [address] [phone]        Sanda Klein, MD .   Specialty: Cardiology Contact information: [address] [place] [address] [phone]        Evans Lance, MD .   Specialty: Cardiology Contact information: [phone]  [place] [address] [phone]              Allergies  Allergen Reactions  . Penicillins Other (See Comments)    Bruise    . Aspirin Other (See Comments)    On coumadin  . Fentanyl Dermatitis    Rash with patch   . Niacin Itching    Consultations:  Cardiology   Procedures/Studies: DG Chest 2 View  Result Date:  06/26/2020 CLINICAL DATA:  86 year old female with a history of stage IIIB chronic kidney disease EXAM: CHEST - 2 VIEW COMPARISON:  05/31/2020 FINDINGS: Cardiomediastinal silhouette unchanged in size and contour. Surgical changes of median sternotomy, mitral valve annuloplasty, aortic valve repair. Unchanged appearance of left chest wall cardiac pacing device/AICD. Coarsened interstitial markings bilateral lungs, similar to the comparison. Interstitial opacities. No confluent airspace disease. No large pleural effusion, no pneumothorax. Degenerative changes of the spine. Osteopenia. Configuration of the thoracic vertebral bodies unchanged  from the comparison, with mild wedge deformity of midthoracic vertebral body. IMPRESSION: Early pulmonary edema versus chronic changes. Similar appearance of cardiomegaly, left chest wall AICD, surgical changes of median sternotomy and CABG with mitral annuloplasty and aortic valve repair. Electronically Signed   By: Corrie Mckusick D.O.   On: 06/26/2020 15:51   DG Chest Port 1 View  Result Date: 06/30/2020 CLINICAL DATA:  Congestive heart failure exacerbation. EXAM: PORTABLE CHEST 1 VIEW COMPARISON:  06/29/2020 FINDINGS: Heart is enlarged. Valve replacements are noted. Pacing for related wires are stable. Atherosclerotic calcifications are noted at the aorta. Diffuse interstitial prominence is stable mostly chronic. Effusions are present. No airspace consolidation is present. Degenerative changes are noted at the left shoulder. IMPRESSION: 1. Cardiomegaly without failure. 2. Stable chronic interstitial prominence compatible with edema. 3. Small bilateral pleural effusions are stable. Electronically Signed   By: San Morelle M.D.   On: 06/30/2020 06:11   DG Chest Port 1 View  Result Date: 06/29/2020 CLINICAL DATA:  Shortness of breath.  Pulmonary edema. EXAM: PORTABLE CHEST 1 VIEW COMPARISON:  Radiograph 06/25/2020, additional priors. CT 05/27/2020. FINDINGS: Stable  cardiomegaly. Unchanged mediastinal contours. Aortic atherosclerosis. Pacemaker in place with prosthetic cardiac valves. Similar appearance of interstitial coarsening from prior exam. No progression. No focal airspace disease. No pleural fluid. No pneumothorax. Stable osseous structures. IMPRESSION: 1. Stable cardiomegaly. 2. Unchanged interstitial coarsening, this is similar to multiple prior exams. Septal thickening on prior CT suggests an element of pulmonary edema. There is background emphysema on CT. 3. No new or acute airspace disease Electronically Signed   By: Keith Rake M.D.   On: 06/29/2020 20:31       Subjective: Patient was seen and examined at bedside.  Overnight events noted.  Patient reports feeling much better.  She denies any chest pain, shortness of breath. Leg swelling has significantly improved.  Patient declined nursing home and wants to be discharged home.  Discharge Exam: Vitals:   07/03/20 0800 07/03/20 0845  BP:  (!) 135/51  Pulse:  (!) 58  Resp:    Temp:    SpO2: 90%    Vitals:   07/02/20 1600 07/02/20 1939 07/03/20 0800 07/03/20 0845  BP:  (!) 120/56  (!) 135/51  Pulse:  (!) 58  (!) 58  Resp:  17    Temp:  97.6 F (36.4 C)    TempSrc:  Oral    SpO2:  97% 90%   Weight: 86.7 kg     Height:        General: Pt is alert, awake, not in acute distress Cardiovascular: RRR, S1/S2 +, no rubs, no gallops Respiratory: CTA bilaterally, no wheezing, no rhonchi Abdominal: Soft, NT, ND, bowel sounds + Extremities: 1+ pitting edema, no cyanosis    The results of significant diagnostics from this hospitalization (including imaging, microbiology, ancillary and laboratory) are listed below for reference.     Microbiology: Recent Results (from the past 240 hour(s))  Respiratory Panel by RT PCR (Flu A&B, Covid) - Nasopharyngeal Swab     Status: None   Collection Time: 06/29/20  7:57 PM   Specimen: Nasopharyngeal Swab  Result Value Ref Range Status   SARS  Coronavirus 2 by RT PCR NEGATIVE NEGATIVE Final    Comment: (NOTE) SARS-CoV-2 target nucleic acids are NOT DETECTED.  The SARS-CoV-2 RNA is generally detectable in upper respiratoy specimens during the acute phase of infection. The lowest concentration of SARS-CoV-2 viral copies this assay can detect is 131 copies/mL. A negative  result does not preclude SARS-Cov-2 infection and should not be used as the sole basis for treatment or other patient management decisions. A negative result may occur with  improper specimen collection/handling, submission of specimen other than nasopharyngeal swab, presence of viral mutation(s) within the areas targeted by this assay, and inadequate number of viral copies (<131 copies/mL). A negative result must be combined with clinical observations, patient history, and epidemiological information. The expected result is Negative.  Fact Sheet for Patients:  PinkCheek.be  Fact Sheet for Healthcare Providers:  GravelBags.it  This test is no t yet approved or cleared by the [place] FDA and  has been authorized for detection and/or diagnosis of SARS-CoV-2 by FDA under an Emergency Use Authorization (EUA). This EUA will remain  in effect (meaning this test can be used) for the duration of the COVID-19 declaration under Section 564(b)(1) of the Act, 21 U.S.C. section 360bbb-3(b)(1), unless the authorization is terminated or revoked sooner.     Influenza A by PCR NEGATIVE NEGATIVE Final   Influenza B by PCR NEGATIVE NEGATIVE Final    Comment: (NOTE) The Xpert Xpress SARS-CoV-2/FLU/RSV assay is intended as an aid in  the diagnosis of influenza from Nasopharyngeal swab specimens and  should not be used as a sole basis for treatment. Nasal washings and  aspirates are unacceptable for Xpert Xpress SARS-CoV-2/FLU/RSV  testing.  Fact Sheet for  Patients: PinkCheek.be  Fact Sheet for Healthcare Providers: GravelBags.it  This test is not yet approved or cleared by the [place] FDA and  has been authorized for detection and/or diagnosis of SARS-CoV-2 by  FDA under an Emergency Use Authorization (EUA). This EUA will remain  in effect (meaning this test can be used) for the duration of the  Covid-19 declaration under Section 564(b)(1) of the Act, 21  U.S.C. section 360bbb-3(b)(1), unless the authorization is  terminated or revoked. Performed at [facility], [address] [place], [place] [address]      Labs: BNP (last 3 results) Recent Labs    03/30/20 1202 05/27/20 1934 06/29/20 2002  BNP 502.0* 572.0* 332.9*   Basic Metabolic Panel: Recent Labs  Lab 06/29/20 2002 06/30/20 0500 07/01/20 0545 07/02/20 0648 07/03/20 0621  NA 134* 137 138 138 138  K 5.2* 4.6 4.0 4.2 3.8  CL 96* 94* 93* 93* 94*  CO2 28 34* 34* 36* 35*  GLUCOSE 116* 105* 81 94 91  BUN 59* 57* 55* 60* 61*  CREATININE 1.70* 1.73* 1.45* 1.87* 1.69*  CALCIUM 9.1 9.1 9.3 8.8* 8.7*  MG  --  2.1 2.0 2.0 2.0  PHOS  --   --  3.3 4.2 3.4   Liver Function Tests: Recent Labs  Lab 06/29/20 2002 06/30/20 0500 07/01/20 0545  AST 34 23 22  ALT 18 16 13   ALKPHOS 61 53 50  BILITOT 0.4 0.6 0.9  PROT 7.9 7.4 6.9  ALBUMIN 3.8 3.3* 3.2*   No results for input(s): LIPASE, AMYLASE in the last 168 hours. No results for input(s): AMMONIA in the last 168 hours. CBC: Recent Labs  Lab 06/29/20 2002 06/29/20 2002 06/30/20 0500 07/01/20 0545 07/01/20 1524 07/02/20 0648 07/03/20 0621  WBC 7.8  --  6.5 5.8  --  5.5 4.9  NEUTROABS 6.9  --  5.4  --   --   --   --   HGB 9.9*   < > 9.0* 8.8* 8.9* 8.6* 9.0*  HCT 32.0*   < > 29.6* 28.9* 29.1* 28.4* 29.4*  MCV 94.4  --  95.5 93.8  --  94.7 93.9  PLT 210  --  197 203  --  191 175   < > = values in this interval not displayed.   Cardiac Enzymes: No  results for input(s): CKTOTAL, CKMB, CKMBINDEX, TROPONINI in the last 168 hours. BNP: Invalid input(s): POCBNP CBG: No results for input(s): GLUCAP in the last 168 hours. D-Dimer No results for input(s): DDIMER in the last 72 hours. Hgb A1c No results for input(s): HGBA1C in the last 72 hours. Lipid Profile No results for input(s): CHOL, HDL, LDLCALC, TRIG, CHOLHDL, LDLDIRECT in the last 72 hours. Thyroid function studies No results for input(s): TSH, T4TOTAL, T3FREE, THYROIDAB in the last 72 hours.  Invalid input(s): FREET3 Anemia work up No results for input(s): VITAMINB12, FOLATE, FERRITIN, TIBC, IRON, RETICCTPCT in the last 72 hours. Urinalysis    Component Value Date/Time   COLORURINE YELLOW 01/26/2020 2055   APPEARANCEUR CLEAR 01/26/2020 2055   LABSPEC 1.010 01/26/2020 2055   PHURINE 5.0 01/26/2020 2055   GLUCOSEU NEGATIVE 01/26/2020 2055   GLUCOSEU NEG mg/dL 03/22/2010 2313   HGBUR NEGATIVE 01/26/2020 2055   HGBUR negative 05/27/2010 1122   BILIRUBINUR NEGATIVE 01/26/2020 2055   BILIRUBINUR negative 04/28/2019 1500   BILIRUBINUR neg 03/11/2013 1443   KETONESUR NEGATIVE 01/26/2020 2055   PROTEINUR NEGATIVE 01/26/2020 2055   UROBILINOGEN 0.2 04/28/2019 1500   UROBILINOGEN 0.2 03/27/2012 1201   NITRITE NEGATIVE 01/26/2020 2055   LEUKOCYTESUR NEGATIVE 01/26/2020 2055   Sepsis Labs Invalid input(s): PROCALCITONIN,  WBC,  LACTICIDVEN Microbiology Recent Results (from the past 240 hour(s))  Respiratory Panel by RT PCR (Flu A&B, Covid) - Nasopharyngeal Swab     Status: None   Collection Time: 06/29/20  7:57 PM   Specimen: Nasopharyngeal Swab  Result Value Ref Range Status   SARS Coronavirus 2 by RT PCR NEGATIVE NEGATIVE Final    Comment: (NOTE) SARS-CoV-2 target nucleic acids are NOT DETECTED.  The SARS-CoV-2 RNA is generally detectable in upper respiratoy specimens during the acute phase of infection. The lowest concentration of SARS-CoV-2 viral copies this assay  can detect is 131 copies/mL. A negative result does not preclude SARS-Cov-2 infection and should not be used as the sole basis for treatment or other patient management decisions. A negative result may occur with  improper specimen collection/handling, submission of specimen other than nasopharyngeal swab, presence of viral mutation(s) within the areas targeted by this assay, and inadequate number of viral copies (<131 copies/mL). A negative result must be combined with clinical observations, patient history, and epidemiological information. The expected result is Negative.  Fact Sheet for Patients:  PinkCheek.be  Fact Sheet for Healthcare Providers:  GravelBags.it  This test is no t yet approved or cleared by the [place] FDA and  has been authorized for detection and/or diagnosis of SARS-CoV-2 by FDA under an Emergency Use Authorization (EUA). This EUA will remain  in effect (meaning this test can be used) for the duration of the COVID-19 declaration under Section 564(b)(1) of the Act, 21 U.S.C. section 360bbb-3(b)(1), unless the authorization is terminated or revoked sooner.     Influenza A by PCR NEGATIVE NEGATIVE Final   Influenza B by PCR NEGATIVE NEGATIVE Final    Comment: (NOTE) The Xpert Xpress SARS-CoV-2/FLU/RSV assay is intended as an aid in  the diagnosis of influenza from Nasopharyngeal swab specimens and  should not be used as a sole basis for treatment. Nasal washings and  aspirates are unacceptable for Xpert Xpress SARS-CoV-2/FLU/RSV  testing.  Fact Sheet for Patients: PinkCheek.be  Fact Sheet for Healthcare Providers: GravelBags.it  This test is not yet approved or cleared by the [place] FDA and  has been authorized for detection and/or diagnosis of SARS-CoV-2 by  FDA under an Emergency Use Authorization (EUA). This EUA will remain   in effect (meaning this test can be used) for the duration of the  Covid-19 declaration under Section 564(b)(1) of the Act, 21  U.S.C. section 360bbb-3(b)(1), unless the authorization is  terminated or revoked. Performed at [facility], [address] [place], [place] [address]      Time coordinating discharge: Over 30 minutes  SIGNED:   Shawna Clamp, MD  [facility] 07/03/2020, 12:56 PM Pager   If 7PM-7AM, please contact night-coverage [email or web address]

## 2020-07-03 NOTE — Progress Notes (Signed)
ANTICOAGULATION CONSULT NOTE -   Pharmacy Consult for warfarin dosing  Indication: atrial fibrillation               Temp: 98.5 F (36.9 C) (10/09 1429) BP: 141/65 (10/09 1621) Pulse Rate: 60 (10/09 1621)  Labs: Recent Labs    07/01/20 0545 07/01/20 0545 07/01/20 1524 07/01/20 1524 07/02/20 0648 07/03/20 0621  HGB 8.8*   < > 8.9*   < > 8.6* 9.0*  HCT 28.9*   < > 29.1*  --  28.4* 29.4*  PLT 203  --   --   --  191 175  LABPROT 26.1*  --   --   --  25.4* 23.6*  INR 2.5*  --   --   --  2.4* 2.2*  CREATININE 1.45*  --   --   --  1.87* 1.69*   < > = values in this interval not displayed.    Goal of Therapy:  INR 2.5-3.5 Monitor platelets by anticoagulation protocol: Yes   Home Dose: warfarin 10mg  DAILY except   5mg  onTuesdays.       Admit INR was 2.9 Lab Results  Component Value Date   INR 2.2 (H) 07/03/2020   INR 2.4 (H) 07/02/2020   INR 2.5 (H) 07/01/2020    Assessment: [name] [name] a 86 y.o. female requires anticoagulation with warfarin for the indication of  afib/aflutter with mechanical AVR and mechanical MVR. Warfarin will be initiated inpatient following pharmacy protocol per pharmacy consult. Patient most recent blood work is as follows: CBC Latest Ref Rng & Units 07/03/2020 07/02/2020 07/01/2020  WBC 4.0 - 10.5 K/uL 4.9 5.5 -  Hemoglobin 12.0 - 15.0 g/dL 9.0(L) 8.6(L) 8.9(L)  Hematocrit 36 - 46 % 29.4(L) 28.4(L) 29.1(L)  Platelets 150 - 400 K/uL 175 191 -    Plan: Give warfarin 10mg  po today for INR 2.2 Monitor CBC daily with am labs   Monitor INR daily Monitor for signs and symptoms of bleeding   Despina Pole, Pharm. D. Clinical Pharmacist 07/03/2020 4:33 PM

## 2020-07-03 NOTE — Progress Notes (Signed)
CRITICAL VALUE ALERT  Critical Value:  Lactic Acid 9.7  Date & Time Notied:  07/03/2020 1721  Provider Notified: Dr. Dwyane Dee   Orders Received/Actions taken: no new orders at this time

## 2020-07-03 NOTE — Discharge Instructions (Signed)
Advised to follow-up with primary care physician in 1 week. Advised to follow-up with cardiology as scheduled. Patient has been advised to take Lasix 60 mg p.o. daily. Home health services has been arranged for the patient.

## 2020-07-03 NOTE — Progress Notes (Signed)
   07/03/20 1600  Assess: MEWS Score  BP (!) 84/44  Pulse Rate 61  Resp (!) 2 (agonal breathing post seizure like activity. )  Level of Consciousness Unresponsive  SpO2 (!) 47 %  O2 Device Bag-valve Mask  O2 Flow Rate (L/min) 15 L/min  Assess: MEWS Score  MEWS Temp 0  MEWS Systolic 1  MEWS Pulse 0  MEWS RR 2  MEWS LOC 3  MEWS Score 6  MEWS Score Color Red  Assess: if the MEWS score is Yellow or Red  Were vital signs taken at a resting state? Yes  Focused Assessment Change from prior assessment (see assessment flowsheet)  Early Detection of Sepsis Score *See Row Information* Low  MEWS guidelines implemented *See Row Information* Yes  Treat  MEWS Interventions Escalated (See documentation below);Consulted Respiratory Therapy  Take Vital Signs  Increase Vital Sign Frequency  Red: Q 1hr X 4 then Q 4hr X 4, if remains red, continue Q 4hrs  Escalate  MEWS: Escalate Red: discuss with charge nurse/RN and provider, consider discussing with RRT  Notify: Charge Nurse/RN  Name of Charge Nurse/RN Notified [place] RN  Date Charge Nurse/RN Notified 07/03/20  Time Charge Nurse/RN Notified 1600  Notify: Provider  Provider Name/Title Dr Dwyane Dee  Date Provider Notified 07/03/20  Time Provider Notified 1600  Notification Type Call  Notification Reason Change in status  Response See new orders  Date of Provider Response 07/03/20  Time of Provider Response 1600  Notify: Rapid Response  Name of Rapid Response RN Notified Self  Date Rapid Response Notified 07/03/20  Time Rapid Response Notified 1600  Document  Patient Outcome Transferred/level of care increased  Progress note created (see row info) Yes

## 2020-07-03 NOTE — Progress Notes (Addendum)
Rapid Response Event Note   Reason for Call :  Seizure like activity  Initial Focused Assessment:  Rapid response RN was assisting primary RN with patient discharge. Patient was alert and oriented initially. After pulling original IV and getting patient's clothing to assist her dress for home, patient noted to have a fixed stare, not responding to verbal stimuli. Patient then noted to have full body jerking movement, nystagmus. Rapid response called. Seizure like activity last approximately 1 minute. Patient with agonal breathing after, RN initiated rescue breathing with BVM. Patient did regain respiratory drive after approximately 10 minutes, OPA in place. Patient somnolent post event.     Interventions:  Rescue breathing with BVM by RN then RT when they arrived at bedside. New labs drawn, EKG obtained, CBG checked. 500 ml normal saline bolus given for hypotension with positive response (return of pre-event BP)  Plan of Care:  Transfer to ICU, labs, CT, Xray    MD Notified: Dr Dwyane Dee Call Time: [place] Time: 7903 End Time: [place]  Lynne Logan, RN

## 2020-07-03 NOTE — Care Plan (Signed)
Patient was discharged earlier in the day. She was waiting for transport.  All of a sudden Patient had a seizure episode.  She became unresponsive, Not responding to verbal stimuli, she was noted to have generalized body shaking,  RRT was called, Patient has a pulse but there was agonal breathing afterwards. Ativan 1 mg IV given . RN initiated rescue breathing with BVM. Seizures stopped,  Patient did regain respiratory drive after approximately 10 minutes.  Patient is DNR.  Patient continues to remain somnolent. She was hypotensive and was given 500 saline bolus,  blood pressure improved.  Patient opened eyes.  Patient was placed on 100% non rebreather,  sats 100%.  Stat labs, chest x-ray,  CT head completed.  CBC/CMP unchanged from morning.  Lactic acid 9.1 Patient is getting 1 L saline bolus. Troponin 55, will recheck lactic acid and troponins.  CXR: Stable cardiomegaly with vascular congestion. CT head: No acute intracranial abnormality, Patient is transferred to stepdown.  Family at bedside explained in detail about the patient's poor prognosis.  Patient is DNR.  Will sign out to Night team to follow up.  CRITICAL CARE Performed by: Shawna Clamp   Total critical care time: 35 minutes  Critical care time was exclusive of separately billable procedures and treating other patients.  Critical care was necessary to treat or prevent imminent or life-threatening deterioration.  Critical care was time spent personally by me on the following activities: development of treatment plan with patient and/or surrogate as well as nursing, discussions with consultants, evaluation of patient's response to treatment, examination of patient, obtaining history from patient or surrogate, ordering and performing treatments and interventions, ordering and review of laboratory studies, ordering and review of radiographic studies, pulse oximetry and re-evaluation of patient's condition.

## 2020-07-03 NOTE — TOC Transition Note (Signed)
Transition of Care [facility]) - CM/SW Discharge Note   Patient Details  Name: [name] [name] MRN: [id number] Date of Birth: 02/26/1932  [facility] CM/SW Contact:  Natasha Bence, LCSW Phone Number: 07/03/2020, 2:09 PM   Clinical Narrative:    CSW notified by MD of concern for discharge home and SNF decline due to patient's weakness. CSW discussed MD's concern with patient's niece. MD reported to have discussed concern with patient. Patient's niece reported that she fully supported SNF placement, but expressed that patient would have to make the decision for her self. Patient continues to decline SNF. CSW notified Corene Cornea with [facility] of patient's discharge. Corene Cornea agreeable to resume services. TOC signing off.    Final next level of care: [place] Barriers to Discharge: Barriers Resolved   Patient Goals and CMS Choice Patient states their goals for this hospitalization and ongoing recovery are:: Return home with [facility] CMS Medicare.gov Compare Post Acute Care list provided to:: Patient Choice offered to / list presented to : Patient  Discharge Placement                Patient to be transferred to facility by: Family Name of family member notified: Cecille Rubin Patient and family notified of of transfer: 07/03/20  Discharge Plan and Services In-house Referral: Clinical Social Work Discharge Planning Services: NA Post Acute Care Choice: Home Health                    HH Arranged: PT, OT [facility] Agency: [place] ([facility]) Date [place]: 07/10/20 Time [place]: [phone] Representative spoke with at [place]: [place] ([place]) Interventions     Readmission Risk Interventions Readmission Risk Prevention Plan 04/01/2020 04/01/2020 02/03/2020  Transportation Screening Complete Complete Complete  PCP or Specialist Appt within 3-5 Days - - -  HRI or [place]  for [place] - - -  Medication Review (RN Care Manager) Complete - Referral to Pharmacy  PCP or Specialist appointment within 3-5 days of discharge - - Complete  HRI or Home Care Consult Complete Complete Complete  SW Recovery Care/Counseling Consult Complete - Complete  Palliative Care Screening Not Applicable Not Applicable Not [place] Not Applicable Not Applicable Not Applicable  Some recent data might be hidden

## 2020-07-04 ENCOUNTER — Inpatient Hospital Stay (HOSPITAL_COMMUNITY): Payer: Medicare Other

## 2020-07-04 DIAGNOSIS — I950 Idiopathic hypotension: Secondary | ICD-10-CM

## 2020-07-04 LAB — PROTIME-INR
INR: 1.8 — ABNORMAL HIGH (ref 0.8–1.2)
Prothrombin Time: 20.4 seconds — ABNORMAL HIGH (ref 11.4–15.2)

## 2020-07-04 LAB — CBC
HCT: 29.1 % — ABNORMAL LOW (ref 36.0–46.0)
Hemoglobin: 9.3 g/dL — ABNORMAL LOW (ref 12.0–15.0)
MCH: 29.1 pg (ref 26.0–34.0)
MCHC: 32 g/dL (ref 30.0–36.0)
MCV: 90.9 fL (ref 80.0–100.0)
Platelets: 182 10*3/uL (ref 150–400)
RBC: 3.2 MIL/uL — ABNORMAL LOW (ref 3.87–5.11)
RDW: 16.9 % — ABNORMAL HIGH (ref 11.5–15.5)
WBC: 12.3 10*3/uL — ABNORMAL HIGH (ref 4.0–10.5)
nRBC: 0 % (ref 0.0–0.2)

## 2020-07-04 LAB — GLUCOSE, CAPILLARY
Glucose-Capillary: 100 mg/dL — ABNORMAL HIGH (ref 70–99)
Glucose-Capillary: 163 mg/dL — ABNORMAL HIGH (ref 70–99)
Glucose-Capillary: 86 mg/dL (ref 70–99)
Glucose-Capillary: 89 mg/dL (ref 70–99)
Glucose-Capillary: 91 mg/dL (ref 70–99)
Glucose-Capillary: 99 mg/dL (ref 70–99)

## 2020-07-04 MED ORDER — SODIUM CHLORIDE 0.9 % IV BOLUS
500.0000 mL | Freq: Once | INTRAVENOUS | Status: AC
  Administered 2020-07-04: 500 mL via INTRAVENOUS

## 2020-07-04 MED ORDER — LEVETIRACETAM IN NACL 1000 MG/100ML IV SOLN
1000.0000 mg | Freq: Once | INTRAVENOUS | Status: AC
  Administered 2020-07-04: 1000 mg via INTRAVENOUS
  Filled 2020-07-04: qty 100

## 2020-07-04 MED ORDER — ENOXAPARIN SODIUM 40 MG/0.4ML ~~LOC~~ SOLN
40.0000 mg | SUBCUTANEOUS | Status: DC
  Administered 2020-07-04: 40 mg via SUBCUTANEOUS
  Filled 2020-07-04 (×2): qty 0.4

## 2020-07-04 MED ORDER — WARFARIN SODIUM 7.5 MG PO TABS: 15.0000 mg | ORAL_TABLET | ORAL | Status: DC

## 2020-07-04 MED ORDER — SODIUM CHLORIDE 0.9% FLUSH: 10.0000 mL | INTRAVENOUS | Status: DC | PRN

## 2020-07-04 MED ORDER — WARFARIN SODIUM 5 MG PO TABS: 10.0000 mg | ORAL_TABLET | ORAL | Status: DC

## 2020-07-04 MED ORDER — LEVETIRACETAM IN NACL 500 MG/100ML IV SOLN
500.0000 mg | Freq: Two times a day (BID) | INTRAVENOUS | Status: DC
  Administered 2020-07-04 – 2020-07-06 (×4): 500 mg via INTRAVENOUS
  Filled 2020-07-04 (×4): qty 100

## 2020-07-04 MED ORDER — SODIUM CHLORIDE 0.9 % IV BOLUS: 500.0000 mL | Freq: Once | INTRAVENOUS | Status: DC

## 2020-07-04 MED ORDER — NOREPINEPHRINE 4 MG/250ML-% IV SOLN: 0.0000 ug/min | INTRAVENOUS | Status: DC

## 2020-07-04 MED ORDER — WARFARIN SODIUM 7.5 MG PO TABS
15.0000 mg | ORAL_TABLET | Freq: Once | ORAL | Status: AC
  Administered 2020-07-04: 15 mg via ORAL
  Filled 2020-07-04: qty 2

## 2020-07-04 MED ORDER — SODIUM CHLORIDE 0.9% FLUSH
10.0000 mL | Freq: Two times a day (BID) | INTRAVENOUS | Status: DC
  Administered 2020-07-04 – 2020-07-07 (×6): 10 mL

## 2020-07-04 NOTE — Progress Notes (Addendum)
PROGRESS NOTE    [name] [name]  PFX:[id number] DOB: [birth year] DOA: 06/29/2020 PCP: Fayrene Helper, MD   Brief Narrative:  This86 years old female with history of hypothyroidism, type 2 diabetes, nonischemic cardiomyopathy,  CHF ( EF 35-40%) Hyperlipidemia, GERD, CKD stage III, Aortic valve repair, CHB, s/p PPM, Persistent Atrial fibrillation , Chronic respiratory failure presented in the ED with worsening shortness of breath. Patient was admitted for acute on chronic systolic CHF exacerbation.  Cardiology was consulted recommended IV diuresis for few days. Patient was discharged yesterday but while waiting for transport, all of a sudden she had a seizure episode followed by unresponsiveness.  Patient had a pulse but there was agonal breathing,  Ativan was given and was given rescue breathing with BVM.  She was DNR, Patient was placed on 100% non rebreather,  O2 sats 100%.  She was moved to ICU.  CT head negative for acute abnormality, EEG is pending patient is started on Keppra, will be seen by neurology tomorrow.  She continues to remain hypotensive despite fluid boluses.  She is requiring pressor support, started on Levophed.  Assessment & Plan:   Active Problems:   CHF exacerbation ([place])   1. Hypotension requiring pressor support:  Patient remains hypotensive despite fluid boluses.  We will start Levophed for pressor support.  General surgery Dr. Arnoldo Morale requested to put central line.  We will continue to monitor blood pressure.  2. Seizure episode: Patient had a seizure episode while waiting for transport yesterday. Patient was given Ativan, seizures stopped.  Continue Ativan as needed. CT head negative for acute abnormality, patient was loaded with Keppra, will continue Keppra 500 mg twice daily.  EEG is pending,  Neurology will see patient tomorrow.  3 .  Lactic acidosis: This could be secondary to seizure episode. Lactic acid has decreased after fluid boluses.  Continue to  monitor lactic acid.  4. Acute on chronic combined systolic and diastolic CHF:  Patient was admitted for CHF exacerbation which was managed adequately.  She was discharged and while waiting for transport she has a seizure, DC cancelled Recent Echo 01/2020 = EF 30-35%. Hold lasix, daily weights, Intake and output,  No ACE/ARB 2/2 renal insufficiency. Hold BP medications.  Will re-consult cardiology   2. Elevated troponin: Trop 28 - 31- 55-146.  In the setting of renal insufficiency. Likely demand ischemia. She denies any chest pain, palpitations, dizzziness. Discussed with cardiologist at [facility], this is troponin leak from demand ischemia. There is no other real ACS event.  3. Renal insufficiencyCKD stage IV Cr baseline is 1.4 - 1.5. Today 1.87 [place] Cardiorenal syndrome.  4. HTN: Hold BP meds.  5. Hypothyroidism: Continue synthroid.  6.:Persistent Atrial fibrillation: Heart rate remains controlled, denies any palpitations. Hold BP meds, continue Coumadin for anticoagulation.  7. CHB, S/P PPM: Recently interrogated in August 2081 shows normal device function.  8. Valvular heart disease: S/p mechanical AVR in 2001 and mechanical MVR in 2004 Recent echo shows no significant valve abnormalities continue Coumadin.  9. COPD:not in exacerbation. Continue supplemental oxygen @ 3L/ m   DVT prophylaxis: Coumadin Code Status:DNR Family Communication: Spoke with Niece/son about poor prognosis.- They will talk among  Themselves about hospice or comfort care. Disposition Plan:   Status is: Inpatient  Remains inpatient appropriate because:Inpatient level of care appropriate due to severity of illness   Dispo: The patient is from: Home              Anticipated d/c is to: SNF  Anticipated d/c date is: 3 days              Patient currently is not medically stable to d/c.  Consultants:   Cardiology , Neurology.  Procedures:  Antimicrobials:   Anti-infectives (From admission, onward)   None      Subjective: Patient was seen and examined in the morning.  She was alert and oriented x 3 having breakfast. She was on 2 L supplemental oxygen saturating 93%.  Later in the day she was very somnolent responding only to painful stimuli.  Objective: Vitals:   07/04/20 1300 07/04/20 1400 07/04/20 1459 07/04/20 1528  BP: (!) 91/34 (!) 87/35 (!) 83/36 (!) 114/43  Pulse: 60 (!) 58 (!) 59   Resp: (!) 27 (!) 22 (!) 26 (!) 27  Temp:      TempSrc:      SpO2: 99% 100% 100%   Weight:      Height:        Intake/Output Summary (Last 24 hours) at 07/04/2020 1628 Last data filed at 07/04/2020 0500 Gross per 24 hour  Intake 502.58 ml  Output 450 ml  Net 52.58 ml   Filed Weights   07/01/20 0633 07/02/20 1600 07/03/20 1801  Weight: 85.8 kg 86.7 kg 86.2 kg    Examination:  General exam: Appears calm and comfortable  Respiratory system: Clear to auscultation. Respiratory effort normal. Cardiovascular system: S1 & S2 heard, RRR. No JVD, murmurs, rubs, gallops or clicks. No pedal edema. Gastrointestinal system: Abdomen is nondistended, soft and nontender. No organomegaly or masses felt. Normal bowel sounds heard. Central nervous system: Alert and oriented. No focal neurological deficits. Extremities: No edema, no cyanosis, no clubbing  Skin: No rashes, lesions or ulcers Psychiatry: Judgement and insight appear normal. Mood & affect appropriate.     Data Reviewed: I have personally reviewed following labs and imaging studies  CBC: Recent Labs  Lab 06/29/20 2002 06/29/20 2002 06/30/20 0500 06/30/20 0500 07/01/20 0545 07/01/20 0545 07/01/20 1524 07/02/20 0648 07/03/20 0621 07/03/20 1622 07/04/20 0625  WBC 7.8   < > 6.5   < > 5.8  --   --  5.5 4.9 9.0 12.3*  NEUTROABS 6.9  --  5.4  --   --   --   --   --   --   --   --   HGB 9.9*   < > 9.0*   < > 8.8*   < > 8.9* 8.6* 9.0* 9.7* 9.3*  HCT 32.0*   < > 29.6*   < > 28.9*   < >  29.1* 28.4* 29.4* 32.3* 29.1*  MCV 94.4   < > 95.5   < > 93.8  --   --  94.7 93.9 98.2 90.9  PLT 210   < > 197   < > 203  --   --  191 175 205 182   < > = values in this interval not displayed.   Basic Metabolic Panel: Recent Labs  Lab 06/30/20 0500 07/01/20 0545 07/02/20 0648 07/03/20 0621 07/03/20 1622  NA 137 138 138 138 138  K 4.6 4.0 4.2 3.8 4.0  CL 94* 93* 93* 94* 94*  CO2 34* 34* 36* 35* 27  GLUCOSE 105* 81 94 91 225*  BUN 57* 55* 60* 61* 55*  CREATININE 1.73* 1.45* 1.87* 1.69* 1.79*  CALCIUM 9.1 9.3 8.8* 8.7* 8.6*  MG 2.1 2.0 2.0 2.0 2.2  PHOS  --  3.3 4.2 3.4 5.2*   GFR: Estimated  Creatinine Clearance: 25.4 mL/min (A) (by C-G formula based on SCr of 1.79 mg/dL (H)). Liver Function Tests: Recent Labs  Lab 06/29/20 2002 06/30/20 0500 07/01/20 0545 07/03/20 1622  AST 34 23 22 43*  ALT 18 16 13 19   ALKPHOS 61 53 50 57  BILITOT 0.4 0.6 0.9 0.6  PROT 7.9 7.4 6.9 6.9  ALBUMIN 3.8 3.3* 3.2* 3.0*   No results for input(s): LIPASE, AMYLASE in the last 168 hours. No results for input(s): AMMONIA in the last 168 hours. Coagulation Profile: Recent Labs  Lab 06/30/20 0500 07/01/20 0545 07/02/20 0648 07/03/20 0621 07/04/20 0625  INR 2.9* 2.5* 2.4* 2.2* 1.8*   Cardiac Enzymes: No results for input(s): CKTOTAL, CKMB, CKMBINDEX, TROPONINI in the last 168 hours. BNP (last 3 results) No results for input(s): PROBNP in the last 8760 hours. HbA1C: No results for input(s): HGBA1C in the last 72 hours. CBG: Recent Labs  Lab 07/04/20 0005 07/04/20 0420 07/04/20 0739 07/04/20 1129 07/04/20 1608  GLUCAP 89 86 91 163* 100*   Lipid Profile: No results for input(s): CHOL, HDL, LDLCALC, TRIG, CHOLHDL, LDLDIRECT in the last 72 hours. Thyroid Function Tests: No results for input(s): TSH, T4TOTAL, FREET4, T3FREE, THYROIDAB in the last 72 hours. Anemia Panel: No results for input(s): VITAMINB12, FOLATE, FERRITIN, TIBC, IRON, RETICCTPCT in the last 72 hours. Sepsis  Labs: Recent Labs  Lab 07/03/20 1622 07/03/20 1759  LATICACIDVEN 9.7* 4.5*    Recent Results (from the past 240 hour(s))  Respiratory Panel by RT PCR (Flu A&B, Covid) - Nasopharyngeal Swab     Status: None   Collection Time: 06/29/20  7:57 PM   Specimen: Nasopharyngeal Swab  Result Value Ref Range Status   SARS Coronavirus 2 by RT PCR NEGATIVE NEGATIVE Final    Comment: (NOTE) SARS-CoV-2 target nucleic acids are NOT DETECTED.  The SARS-CoV-2 RNA is generally detectable in upper respiratoy specimens during the acute phase of infection. The lowest concentration of SARS-CoV-2 viral copies this assay can detect is 131 copies/mL. A negative result does not preclude SARS-Cov-2 infection and should not be used as the sole basis for treatment or other patient management decisions. A negative result may occur with  improper specimen collection/handling, submission of specimen other than nasopharyngeal swab, presence of viral mutation(s) within the areas targeted by this assay, and inadequate number of viral copies (<131 copies/mL). A negative result must be combined with clinical observations, patient history, and epidemiological information. The expected result is Negative.  Fact Sheet for Patients:  PinkCheek.be  Fact Sheet for Healthcare Providers:  GravelBags.it  This test is no t yet approved or cleared by the [place] FDA and  has been authorized for detection and/or diagnosis of SARS-CoV-2 by FDA under an Emergency Use Authorization (EUA). This EUA will remain  in effect (meaning this test can be used) for the duration of the COVID-19 declaration under Section 564(b)(1) of the Act, 21 U.S.C. section 360bbb-3(b)(1), unless the authorization is terminated or revoked sooner.     Influenza A by PCR NEGATIVE NEGATIVE Final   Influenza B by PCR NEGATIVE NEGATIVE Final    Comment: (NOTE) The Xpert Xpress  SARS-CoV-2/FLU/RSV assay is intended as an aid in  the diagnosis of influenza from Nasopharyngeal swab specimens and  should not be used as a sole basis for treatment. Nasal washings and  aspirates are unacceptable for Xpert Xpress SARS-CoV-2/FLU/RSV  testing.  Fact Sheet for Patients: PinkCheek.be  Fact Sheet for Healthcare Providers: GravelBags.it  This test is not  yet approved or cleared by the [place] and  has been authorized for detection and/or diagnosis of SARS-CoV-2 by  FDA under an Emergency Use Authorization (EUA). This EUA will remain  in effect (meaning this test can be used) for the duration of the  Covid-19 declaration under Section 564(b)(1) of the Act, 21  U.S.C. section 360bbb-3(b)(1), unless the authorization is  terminated or revoked. Performed at [facility], [address] [place], [place] [address]      Radiology Studies: CT HEAD WO CONTRAST  Result Date: 07/03/2020 CLINICAL DATA:  Seizure.  Unresponsive. EXAM: CT HEAD WITHOUT CONTRAST TECHNIQUE: Contiguous axial images were obtained from the base of the skull through the vertex without intravenous contrast. COMPARISON:  CT head 03/27/2017. FINDINGS: Despite efforts by the technologist and patient, mild motion artifact is present on today's exam and could not be eliminated. This reduces exam sensitivity and specificity. Brain: There is no evidence of acute intracranial hemorrhage, mass lesion, brain edema or extra-axial fluid collection. Mildly progressive atrophy with prominence of the ventricles and subarachnoid spaces. There is mild low-density in the periventricular white matter, likely due to chronic small vessel ischemic changes. There is no CT evidence of acute cortical infarction. Vascular: Intracranial vascular calcifications. No hyperdense vessel identified. Skull: Negative for fracture or focal lesion. Sinuses/Orbits: Opacified left division  of the sphenoid sinus. The additional paranasal sinuses, mastoid air cells and middle ears are clear. No acute orbital findings. Probable previous lens surgery bilaterally. Other: None. IMPRESSION: 1. No acute intracranial findings. 2. Mildly progressive atrophy and chronic small vessel ischemic changes. 3. Left sphenoid sinus disease. Electronically Signed   By: Richardean Sale M.D.   On: 07/03/2020 17:20   DG Chest Port 1 View  Result Date: 07/03/2020 CLINICAL DATA:  86 year old female with seizure. EXAM: PORTABLE CHEST 1 VIEW COMPARISON:  Chest radiograph dated 06/30/2020. FINDINGS: Evaluation is limited due to patient's rotation. There is stable cardiomegaly with vascular congestion. Diffuse interstitial and peribronchial densities may represent edema although atypical infection is not excluded. Clinical correlation is recommended. No large pleural effusion or pneumothorax. Median sternotomy wires and left pectoral AICD device. Atherosclerotic calcification of the aorta. No acute osseous pathology. IMPRESSION: Stable cardiomegaly with vascular congestion. Pneumonia is not excluded. Electronically Signed   By: Anner Crete M.D.   On: 07/03/2020 17:34    Scheduled Meds: . Chlorhexidine Gluconate Cloth  6 each Topical Daily  . enoxaparin (LOVENOX) injection  40 mg Subcutaneous Q24H  . fluticasone  1 spray Each Nare Daily  . fluticasone furoate-vilanterol  1 puff Inhalation Daily  . folic acid  1 mg Oral Daily  . furosemide  60 mg Oral Daily  . gabapentin  300 mg Oral BID  . hydrALAZINE  25 mg Oral BID  . isosorbide mononitrate  15 mg Oral q morning - 10a  . levothyroxine  75 mcg Oral QAC breakfast  . metoprolol succinate  100 mg Oral Daily  . pravastatin  40 mg Oral Daily  . sodium chloride flush  3 mL Intravenous Q12H  . [START ON 07/05/2020] warfarin  10 mg Oral Once per day on Sun Mon Wed Thu Fri Sat  . warfarin  15 mg Oral ONCE-1600  . [START ON 07/06/2020] warfarin  5 mg Oral Once  per day on Tue  . Warfarin - Pharmacist Dosing Inpatient   Does not apply q1600   Continuous Infusions: . sodium chloride    . levETIRAcetam    . sodium chloride  LOS: 4 days    Time spent: 35 mins    Shekera Beavers, MD [facility]   If 7PM-7AM, please contact night-coverage

## 2020-07-04 NOTE — Progress Notes (Signed)
ANTICOAGULATION CONSULT NOTE -   Pharmacy Consult for warfarin dosing  Indication: atrial fibrillation               Temp: 97.8 F (36.6 C) (10/10 1127) Temp Source: Oral (10/10 1127) BP: 117/44 (10/10 0800) Pulse Rate: 59 (10/10 1127)  Labs: Recent Labs    07/02/20 0648 07/02/20 0648 07/03/20 0621 07/03/20 0621 07/03/20 1622 07/04/20 0625  HGB 8.6*   < > 9.0*   < > 9.7* 9.3*  HCT 28.4*   < > 29.4*  --  32.3* 29.1*  PLT 191   < > 175  --  205 182  LABPROT 25.4*  --  23.6*  --   --  20.4*  INR 2.4*  --  2.2*  --   --  1.8*  CREATININE 1.87*  --  1.69*  --  1.79*  --    < > = values in this interval not displayed.    Goal of Therapy:  INR 2.5-3.5 Monitor platelets by anticoagulation protocol: Yes   Home Dose: warfarin 10mg  DAILY except   5mg  onTuesdays.       Admit INR was 2.9 Lab Results  Component Value Date   INR 1.8 (H) 07/04/2020   INR 2.2 (H) 07/03/2020   INR 2.4 (H) 07/02/2020    Assessment: [name] [name] a 86 y.o. female requires anticoagulation with warfarin for the indication of  afib/aflutter with mechanical AVR and mechanical MVR. Warfarin will be initiated inpatient following pharmacy protocol per pharmacy consult. Patient most recent blood work is as follows: CBC Latest Ref Rng & Units 07/04/2020 07/03/2020 07/03/2020  WBC 4.0 - 10.5 K/uL 12.3(H) 9.0 4.9  Hemoglobin 12.0 - 15.0 g/dL 9.3(L) 9.7(L) 9.0(L)  Hematocrit 36 - 46 % 29.1(L) 32.3(L) 29.4(L)  Platelets 150 - 400 K/uL 182 205 175    Plan: Give warfarin 15mg  po now for INR 1.8 Monitor CBC daily with am labs   Monitor INR daily Monitor for signs and symptoms of bleeding   Despina Pole, Pharm. D. Clinical Pharmacist 07/04/2020 1:06 PM

## 2020-07-04 NOTE — Op Note (Signed)
Patient:  [name] [name]  DOB:  May 18, 1932  MRN:  [id number]   Preop Diagnosis: Hypotension, congestive heart failure, need for IV Levophed  Postop Diagnosis: Same  Procedure: Central line placement  Surgeon: Aviva Signs, MD  Anes: Local  Indications: Patient is an 86 year old black female who has been persistently hypotensive despite fluid boluses.  She is about to start on Levophed and needs central venous access.  The risks and benefits of the procedure were explained to the family member, who gave informed consent for the patient as the patient has decreased mental status due to disease.  Procedure note: Was placed in the Trendelenburg position.  The procedure was performed in the ICU.  The right upper chest was prepped and draped using usual sterile technique with ChloraPrep.  Surgical site confirmation was performed.  1% Xylocaine was used for local anesthesia.  The needle was advanced into the right subclavian vein using the Seldinger technique without difficulty.  The guidewire was then advanced into the right atrium.  An introducer was placed over the guidewire.  A triple-lumen catheter was then inserted to the 16 cm Krisinda Giovanni.  Good backflow of venous blood was noted on aspiration of all 3 ports.  All 3 ports were flushed with saline.  It was secured in place using a 3-0 silk suture.  A dry sterile dressing was applied.  The patient tolerated the procedure well.  A chest x-ray is pending.  Complications: None  EBL: Minimal  Specimen: None

## 2020-07-05 ENCOUNTER — Inpatient Hospital Stay (HOSPITAL_COMMUNITY)
Admit: 2020-07-05 | Discharge: 2020-07-05 | Disposition: A | Discharge status: Home / Self Care | Payer: Medicare Other | Attending: Family Medicine | Admitting: Family Medicine

## 2020-07-05 DIAGNOSIS — I5023 Acute on chronic systolic (congestive) heart failure: Secondary | ICD-10-CM

## 2020-07-05 DIAGNOSIS — I10 Essential (primary) hypertension: Secondary | ICD-10-CM | POA: Diagnosis not present

## 2020-07-05 DIAGNOSIS — I5043 Acute on chronic combined systolic (congestive) and diastolic (congestive) heart failure: Secondary | ICD-10-CM | POA: Diagnosis not present

## 2020-07-05 LAB — CBC
HCT: 26.2 % — ABNORMAL LOW (ref 36.0–46.0)
Hemoglobin: 8.1 g/dL — ABNORMAL LOW (ref 12.0–15.0)
MCH: 29.1 pg (ref 26.0–34.0)
MCHC: 30.9 g/dL (ref 30.0–36.0)
MCV: 94.2 fL (ref 80.0–100.0)
Platelets: 157 10*3/uL (ref 150–400)
RBC: 2.78 MIL/uL — ABNORMAL LOW (ref 3.87–5.11)
RDW: 17.2 % — ABNORMAL HIGH (ref 11.5–15.5)
WBC: 9.4 10*3/uL (ref 4.0–10.5)
nRBC: 0 % (ref 0.0–0.2)

## 2020-07-05 LAB — PROTIME-INR
INR: 2 — ABNORMAL HIGH (ref 0.8–1.2)
Prothrombin Time: 21.9 seconds — ABNORMAL HIGH (ref 11.4–15.2)

## 2020-07-05 LAB — COMPREHENSIVE METABOLIC PANEL
ALT: 15 U/L (ref 0–44)
AST: 26 U/L (ref 15–41)
Albumin: 2.6 g/dL — ABNORMAL LOW (ref 3.5–5.0)
Alkaline Phosphatase: 45 U/L (ref 38–126)
Anion gap: 9 (ref 5–15)
BUN: 62 mg/dL — ABNORMAL HIGH (ref 8–23)
CO2: 32 mmol/L (ref 22–32)
Calcium: 8.4 mg/dL — ABNORMAL LOW (ref 8.9–10.3)
Chloride: 97 mmol/L — ABNORMAL LOW (ref 98–111)
Creatinine, Ser: 1.86 mg/dL — ABNORMAL HIGH (ref 0.44–1.00)
GFR, Estimated: 24 mL/min — ABNORMAL LOW (ref >60)
Glucose, Bld: 90 mg/dL (ref 70–99)
Potassium: 3.6 mmol/L (ref 3.5–5.1)
Sodium: 138 mmol/L (ref 135–145)
Total Bilirubin: 1 mg/dL (ref 0.3–1.2)
Total Protein: 6.2 g/dL — ABNORMAL LOW (ref 6.5–8.1)

## 2020-07-05 LAB — GLUCOSE, CAPILLARY
Glucose-Capillary: 104 mg/dL — ABNORMAL HIGH (ref 70–99)
Glucose-Capillary: 164 mg/dL — ABNORMAL HIGH (ref 70–99)
Glucose-Capillary: 76 mg/dL (ref 70–99)
Glucose-Capillary: 84 mg/dL (ref 70–99)
Glucose-Capillary: 87 mg/dL (ref 70–99)
Glucose-Capillary: 95 mg/dL (ref 70–99)
Glucose-Capillary: 98 mg/dL (ref 70–99)

## 2020-07-05 LAB — MAGNESIUM: Magnesium: 1.9 mg/dL (ref 1.7–2.4)

## 2020-07-05 LAB — LACTIC ACID, PLASMA
Lactic Acid, Venous: 0.7 mmol/L (ref 0.5–1.9)
Lactic Acid, Venous: 0.9 mmol/L (ref 0.5–1.9)

## 2020-07-05 LAB — PHOSPHORUS: Phosphorus: 3.6 mg/dL (ref 2.5–4.6)

## 2020-07-05 MED ORDER — ENOXAPARIN SODIUM 30 MG/0.3ML ~~LOC~~ SOLN: 30.0000 mg | SUBCUTANEOUS | Status: DC

## 2020-07-05 MED ORDER — WARFARIN SODIUM 7.5 MG PO TABS
15.0000 mg | ORAL_TABLET | Freq: Once | ORAL | Status: AC
  Administered 2020-07-05: 15 mg via ORAL
  Filled 2020-07-05: qty 2

## 2020-07-05 MED ORDER — SENNA 8.6 MG PO TABS: 1.0000 | ORAL_TABLET | Freq: Two times a day (BID) | ORAL | Status: DC | PRN

## 2020-07-05 MED ORDER — ENOXAPARIN SODIUM 100 MG/ML ~~LOC~~ SOLN
90.0000 mg | SUBCUTANEOUS | Status: DC
  Administered 2020-07-05 – 2020-07-06 (×2): 90 mg via SUBCUTANEOUS
  Filled 2020-07-05 (×2): qty 1

## 2020-07-05 MED ORDER — WARFARIN SODIUM 5 MG PO TABS: 10.0000 mg | ORAL_TABLET | ORAL | Status: DC

## 2020-07-05 NOTE — Progress Notes (Signed)
PROGRESS NOTE    [name] [name]  KZS:[id number] DOB: 11/27/1931 DOA: 06/29/2020 PCP: Fayrene Helper, MD   Brief Narrative:  This18 years old female with history of hypothyroidism, type 2 diabetes, nonischemic cardiomyopathy,  CHF ( EF 35-40%) Hyperlipidemia, GERD, CKD stage III, Aortic valve repair, CHB, s/p PPM, Persistent Atrial fibrillation , Chronic respiratory failure presented in the ED with worsening shortness of breath. Patient was admitted for acute on chronic systolic CHF exacerbation.  Cardiology was consulted recommended IV diuresis for few days. Patient was discharged  but while waiting for transport, all of a sudden she had a seizure episode followed by unresponsiveness. Patient had a pulse but there was agonal breathing,  Ativan was given and was given rescue breathing with BVM.  She was DNR, Patient was placed on 100% non rebreather,  O2 sats 100%.  She was moved to ICU.  CT head negative for acute abnormality, EEG is pending.  Patient is started on Keppra, will be seen by neurology today.  She continues to remain hypotensive despite fluid boluses.  She is requiring pressor support, started on Levophed briefly.   Assessment & Plan:   Active Problems:   CHF exacerbation (HCC)   Idiopathic hypotension   1. Hypotension requiring pressor support:  Patient remains hypotensive despite fluid boluses. Started on Levophed for pressor support briefly. BP improved. Levophed discontinued.   2. Seizure episode:  Patient had a seizure episode while waiting for transport on 10/09. Patient was given Ativan, seizures stopped.  Continue Ativan as needed. CT head negative for acute abnormality, Patient was loaded with Keppra, will continue Keppra 500 mg twice daily.  EEG is pending,  Neurology consulted, will follow up recommendation.  3 . Lactic acidosis:  This could be secondary to seizure episode. Lactic acid has decreased after fluid boluses.  Continue to monitor lactic acid.  4.  Acute on chronic combined systolic and diastolic CHF:  Patient was admitted for CHF exacerbation which was managed adequately.  She was discharged and while waiting for transport she has a seizure, DC cancelled Recent Echo 01/2020 = EF 30-35%. Hold lasix, daily weights, Intake and output,  No ACE/ARB 2/2 renal insufficiency. Hold BP medications. Cardiology re-consulted, awaiting recommendations.  2. Elevated troponin: Trop 28 - 31- 55-146.  In the setting of renal insufficiency. Likely demand ischemia. She denies any chest pain, palpitations, dizzziness. Discussed with cardiologist at [facility], this is troponin leak from demand ischemia. There is no other real ACS event.  3. Renal insufficiencyCKD stage IV Cr baseline is 1.4 - 1.5. Today 1.86 Couldbe Cardiorenal syndrome.  4. HTN: Hold BP meds.  5. Hypothyroidism: Continue synthroid.  6.:Persistent Atrial fibrillation: Heart rate remains controlled, denies any palpitations. Hold BP meds, continue Coumadin for anticoagulation. INR dropped, because she hasnt had coumadin one day due to seizure. INR 2.0 today . Pharmacy manages INR.  7. CHB, S/P PPM: Recently interrogated in August 2081 shows normal device function.  8. Valvular heart disease: S/p mechanical AVR in 2001 and mechanical MVR in 2004 Recent echo shows no significant valve abnormalities continue Coumadin.  9. COPD:not in exacerbation. Continue supplemental oxygen @ 3L/ m   DVT prophylaxis: Coumadin Code Status:DNR Family Communication: Spoke with Niece/son about poor prognosis.- They will talk among  Themselves about hospice or comfort care. Disposition Plan:   Status is: Inpatient  Remains inpatient appropriate because:Inpatient level of care appropriate due to severity of illness   Dispo: The patient is from: Home  Anticipated d/c is to: SNF              Anticipated d/c date is: 3 days              Patient currently is not  medically stable to d/c.  Consultants:   Cardiology , Neurology.  Procedures:  Antimicrobials:  Anti-infectives (From admission, onward)   None     Subjective: Patient was seen and examined in the morning.  She was alert and oriented x 3. She was on 2 L supplemental oxygen saturating 93%. She is off levophed, BP is stable. She wants to go home.  Objective: Vitals:   07/05/20 0800 07/05/20 1100 07/05/20 1200 07/05/20 1300  BP: (!) 93/41  (!) 118/36 (!) 104/32  Pulse: 60  60 60  Resp: (!) 22  (!) 37 (!) 25  Temp:  98 F (36.7 C)    TempSrc:      SpO2: 99%  98% 95%  Weight:      Height:        Intake/Output Summary (Last 24 hours) at 07/05/2020 1606 Last data filed at 07/05/2020 0500 Gross per 24 hour  Intake 461.36 ml  Output 900 ml  Net -438.64 ml   Filed Weights   07/02/20 1600 07/03/20 1801 07/05/20 0500  Weight: 86.7 kg 86.2 kg 90.2 kg    Examination:  General exam: Appears calm and comfortable  Respiratory system: Clear to auscultation. Respiratory effort normal. Cardiovascular system: S1 & S2 heard, RRR. No JVD, murmurs, rubs, gallops or clicks. No pedal edema. PPM noted. Gastrointestinal system: Abdomen is nondistended, soft and nontender. No organomegaly or masses felt. Normal bowel sounds heard. Central nervous system: Alert and oriented. No focal neurological deficits. Extremities: No edema, no cyanosis, no clubbing  Skin: No rashes, lesions or ulcers Psychiatry: Judgement and insight appear normal. Mood & affect appropriate.     Data Reviewed: I have personally reviewed following labs and imaging studies  CBC: Recent Labs  Lab 06/29/20 2002 06/29/20 2002 06/30/20 0500 07/01/20 0545 07/02/20 3016 07/03/20 0109 07/03/20 1622 07/04/20 0625 07/05/20 0457  WBC 7.8   < > 6.5   < > 5.5 4.9 9.0 12.3* 9.4  NEUTROABS 6.9  --  5.4  --   --   --   --   --   --   HGB 9.9*   < > 9.0*   < > 8.6* 9.0* 9.7* 9.3* 8.1*  HCT 32.0*   < > 29.6*   < > 28.4*  29.4* 32.3* 29.1* 26.2*  MCV 94.4   < > 95.5   < > 94.7 93.9 98.2 90.9 94.2  PLT 210   < > 197   < > 191 175 205 182 157   < > = values in this interval not displayed.   Basic Metabolic Panel: Recent Labs  Lab 07/01/20 0545 07/02/20 0648 07/03/20 0621 07/03/20 1622 07/05/20 0457  NA 138 138 138 138 138  K 4.0 4.2 3.8 4.0 3.6  CL 93* 93* 94* 94* 97*  CO2 34* 36* 35* 27 32  GLUCOSE 81 94 91 225* 90  BUN 55* 60* 61* 55* 62*  CREATININE 1.45* 1.87* 1.69* 1.79* 1.86*  CALCIUM 9.3 8.8* 8.7* 8.6* 8.4*  MG 2.0 2.0 2.0 2.2 1.9  PHOS 3.3 4.2 3.4 5.2* 3.6   GFR: Estimated Creatinine Clearance: 25 mL/min (A) (by C-G formula based on SCr of 1.86 mg/dL (H)). Liver Function Tests: Recent Labs  Lab 06/29/20 2002 06/30/20 0500  07/01/20 0545 07/03/20 1622 07/05/20 0457  AST 34 23 22 43* 26  ALT 18 16 13 19 15   ALKPHOS 61 53 50 57 45  BILITOT 0.4 0.6 0.9 0.6 1.0  PROT 7.9 7.4 6.9 6.9 6.2*  ALBUMIN 3.8 3.3* 3.2* 3.0* 2.6*   No results for input(s): LIPASE, AMYLASE in the last 168 hours. No results for input(s): AMMONIA in the last 168 hours. Coagulation Profile: Recent Labs  Lab 07/01/20 0545 07/02/20 0648 07/03/20 0621 07/04/20 0625 07/05/20 0457  INR 2.5* 2.4* 2.2* 1.8* 2.0*   Cardiac Enzymes: No results for input(s): CKTOTAL, CKMB, CKMBINDEX, TROPONINI in the last 168 hours. BNP (last 3 results) No results for input(s): PROBNP in the last 8760 hours. HbA1C: No results for input(s): HGBA1C in the last 72 hours. CBG: Recent Labs  Lab 07/05/20 0035 07/05/20 0410 07/05/20 0524 07/05/20 0732 07/05/20 1119  GLUCAP 98 76 84 87 104*   Lipid Profile: No results for input(s): CHOL, HDL, LDLCALC, TRIG, CHOLHDL, LDLDIRECT in the last 72 hours. Thyroid Function Tests: No results for input(s): TSH, T4TOTAL, FREET4, T3FREE, THYROIDAB in the last 72 hours. Anemia Panel: No results for input(s): VITAMINB12, FOLATE, FERRITIN, TIBC, IRON, RETICCTPCT in the last 72 hours. Sepsis  Labs: Recent Labs  Lab 07/03/20 1622 07/03/20 1759  LATICACIDVEN 9.7* 4.5*    Recent Results (from the past 240 hour(s))  Respiratory Panel by RT PCR (Flu A&B, Covid) - Nasopharyngeal Swab     Status: None   Collection Time: 06/29/20  7:57 PM   Specimen: Nasopharyngeal Swab  Result Value Ref Range Status   SARS Coronavirus 2 by RT PCR NEGATIVE NEGATIVE Final    Comment: (NOTE) SARS-CoV-2 target nucleic acids are NOT DETECTED.  The SARS-CoV-2 RNA is generally detectable in upper respiratoy specimens during the acute phase of infection. The lowest concentration of SARS-CoV-2 viral copies this assay can detect is 131 copies/mL. A negative result does not preclude SARS-Cov-2 infection and should not be used as the sole basis for treatment or other patient management decisions. A negative result may occur with  improper specimen collection/handling, submission of specimen other than nasopharyngeal swab, presence of viral mutation(s) within the areas targeted by this assay, and inadequate number of viral copies (<131 copies/mL). A negative result must be combined with clinical observations, patient history, and epidemiological information. The expected result is Negative.  Fact Sheet for Patients:  PinkCheek.be  Fact Sheet for Healthcare Providers:  GravelBags.it  This test is no t yet approved or cleared by the [place] FDA and  has been authorized for detection and/or diagnosis of SARS-CoV-2 by FDA under an Emergency Use Authorization (EUA). This EUA will remain  in effect (meaning this test can be used) for the duration of the COVID-19 declaration under Section 564(b)(1) of the Act, 21 U.S.C. section 360bbb-3(b)(1), unless the authorization is terminated or revoked sooner.     Influenza A by PCR NEGATIVE NEGATIVE Final   Influenza B by PCR NEGATIVE NEGATIVE Final    Comment: (NOTE) The Xpert Xpress  SARS-CoV-2/FLU/RSV assay is intended as an aid in  the diagnosis of influenza from Nasopharyngeal swab specimens and  should not be used as a sole basis for treatment. Nasal washings and  aspirates are unacceptable for Xpert Xpress SARS-CoV-2/FLU/RSV  testing.  Fact Sheet for Patients: PinkCheek.be  Fact Sheet for Healthcare Providers: GravelBags.it  This test is not yet approved or cleared by the [place] FDA and  has been authorized for detection and/or diagnosis  of SARS-CoV-2 by  FDA under an Emergency Use Authorization (EUA). This EUA will remain  in effect (meaning this test can be used) for the duration of the  Covid-19 declaration under Section 564(b)(1) of the Act, 21  U.S.C. section 360bbb-3(b)(1), unless the authorization is  terminated or revoked. Performed at [facility], [address], [place], [place] [address]      Radiology Studies: CT HEAD WO CONTRAST  Result Date: 07/03/2020 CLINICAL DATA:  Seizure.  Unresponsive. EXAM: CT HEAD WITHOUT CONTRAST TECHNIQUE: Contiguous axial images were obtained from the base of the skull through the vertex without intravenous contrast. COMPARISON:  CT head 03/27/2017. FINDINGS: Despite efforts by the technologist and patient, mild motion artifact is present on today's exam and could not be eliminated. This reduces exam sensitivity and specificity. Brain: There is no evidence of acute intracranial hemorrhage, mass lesion, brain edema or extra-axial fluid collection. Mildly progressive atrophy with prominence of the ventricles and subarachnoid spaces. There is mild low-density in the periventricular white matter, likely due to chronic small vessel ischemic changes. There is no CT evidence of acute cortical infarction. Vascular: Intracranial vascular calcifications. No hyperdense vessel identified. Skull: Negative for fracture or focal lesion. Sinuses/Orbits: Opacified left division  of the sphenoid sinus. The additional paranasal sinuses, mastoid air cells and middle ears are clear. No acute orbital findings. Probable previous lens surgery bilaterally. Other: None. IMPRESSION: 1. No acute intracranial findings. 2. Mildly progressive atrophy and chronic small vessel ischemic changes. 3. Left sphenoid sinus disease. Electronically Signed   By: Richardean Sale M.D.   On: 07/03/2020 17:20   DG Chest Port 1 View  Result Date: 07/04/2020 CLINICAL DATA:  86 year old female with CHF. Central line placement. EXAM: PORTABLE CHEST 1 VIEW COMPARISON:  Chest radiograph dated 07/03/2020. FINDINGS: Interval placement of right subclavian central venous line with tip over central SVC close to the cavoatrial junction. Left lung base opacity most consistent with infiltrate. Clinical correlation and follow-up recommended. There is diffuse interstitial prominence which may be chronic or represent edema. No large pleural effusion or pneumothorax. Stable cardiomegaly. Left pectoral AICD device. Median sternotomy wires and mechanical cardiac valve. Atherosclerotic calcification of the aorta. Degenerative changes of the spine. No acute osseous pathology. IMPRESSION: 1. Interval placement of right subclavian central venous line with tip over central SVC. No pneumothorax. 2. Left lung base infiltrate. Electronically Signed   By: Anner Crete M.D.   On: 07/04/2020 18:27   DG Chest Port 1 View  Result Date: 07/03/2020 CLINICAL DATA:  86 year old female with seizure. EXAM: PORTABLE CHEST 1 VIEW COMPARISON:  Chest radiograph dated 06/30/2020. FINDINGS: Evaluation is limited due to patient's rotation. There is stable cardiomegaly with vascular congestion. Diffuse interstitial and peribronchial densities may represent edema although atypical infection is not excluded. Clinical correlation is recommended. No large pleural effusion or pneumothorax. Median sternotomy wires and left pectoral AICD device.  Atherosclerotic calcification of the aorta. No acute osseous pathology. IMPRESSION: Stable cardiomegaly with vascular congestion. Pneumonia is not excluded. Electronically Signed   By: Anner Crete M.D.   On: 07/03/2020 17:34    Scheduled Meds: . Chlorhexidine Gluconate Cloth  6 each Topical Daily  . enoxaparin (LOVENOX) injection  90 mg Subcutaneous Q24H  . fluticasone  1 spray Each Nare Daily  . fluticasone furoate-vilanterol  1 puff Inhalation Daily  . folic acid  1 mg Oral Daily  . furosemide  60 mg Oral Daily  . gabapentin  300 mg Oral BID  . hydrALAZINE  25 mg Oral BID  . isosorbide mononitrate  15 mg Oral q morning - 10a  . levothyroxine  75 mcg Oral QAC breakfast  . metoprolol succinate  100 mg Oral Daily  . pravastatin  40 mg Oral Daily  . sodium chloride flush  10-40 mL Intracatheter Q12H  . sodium chloride flush  3 mL Intravenous Q12H  . [START ON 07/07/2020] warfarin  10 mg Oral Once per day on Sun Mon Wed Thu Fri Sat  . warfarin  15 mg Oral ONCE-1600  . [START ON 07/06/2020] warfarin  5 mg Oral Once per day on Tue  . Warfarin - Pharmacist Dosing Inpatient   Does not apply q1600   Continuous Infusions: . sodium chloride    . levETIRAcetam 500 mg (07/05/20 0016)  . norepinephrine (LEVOPHED) Adult infusion    . sodium chloride       LOS: 5 days    Time spent: 35 mins    Neyah Ellerman, MD [facility]   If 7PM-7AM, please contact night-coverage
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
## 2020-07-05 NOTE — Progress Notes (Addendum)
ANTICOAGULATION CONSULT NOTE -   Pharmacy Consult for warfarin dosing  Indication: atrial fibrillation               Temp: 98.6 F (37 C) (10/11 0700) Temp Source: Oral (10/11 0500) BP: 93/41 (10/11 0800) Pulse Rate: 60 (10/11 0800)  Labs: Recent Labs    07/03/20 0621 07/03/20 0621 07/03/20 1622 07/03/20 1622 07/04/20 0625 07/05/20 0457  HGB 9.0*   < > 9.7*   < > 9.3* 8.1*  HCT 29.4*   < > 32.3*  --  29.1* 26.2*  PLT 175   < > 205  --  182 157  LABPROT 23.6*  --   --   --  20.4* 21.9*  INR 2.2*  --   --   --  1.8* 2.0*  CREATININE 1.69*  --  1.79*  --   --  1.86*   < > = values in this interval not displayed.    Goal of Therapy:  INR 2.5-3.5 Monitor platelets by anticoagulation protocol: Yes   Home Dose: warfarin 10mg  DAILY except   5mg  onTuesdays.       Admit INR was 2.9 Lab Results  Component Value Date   INR 2.0 (H) 07/05/2020   INR 1.8 (H) 07/04/2020   INR 2.2 (H) 07/03/2020    Assessment: [name] [name] a 86 y.o. female requires anticoagulation with warfarin for the indication of  afib/aflutter with mechanical AVR and mechanical MVR. Warfarin will be initiated inpatient following pharmacy protocol per pharmacy consult. Patient most recent blood work is as follows: CBC Latest Ref Rng & Units 07/05/2020 07/04/2020 07/03/2020  WBC 4.0 - 10.5 K/uL 9.4 12.3(H) 9.0  Hemoglobin 12.0 - 15.0 g/dL 8.1(L) 9.3(L) 9.7(L)  Hematocrit 36 - 46 % 26.2(L) 29.1(L) 32.3(L)  Platelets 150 - 400 K/uL 157 182 205    Plan: Repeat  warfarin 15mg  po  today for INR  2.0 Monitor CBC daily with am labs   Monitor INR daily Monitor for signs and symptoms of bleeding   Despina Pole, Pharm. D. Clinical Pharmacist 07/05/2020 9:41 AM

## 2020-07-05 NOTE — Progress Notes (Signed)
Chaplain engaged in initial visit with [name] [name] and her niece.  Chaplain explained her role and offered support.  During visit, [name] shared that she has been feeling really constipated and that she needed to go to the bathroom.  She also noted that her teeth are missing.  Chaplain offered the ministries of listening and support.  Both issues seem to be providing discomfort for [name] [name].   Chaplain followed up with nurse Caryl Pina about [name]'s concerns, and will follow-up with [name] again.    Chaplain available to provide further support if needed.     07/05/20 1100  Clinical Encounter Type  Visited With Patient and family together  Visit Type Initial

## 2020-07-05 NOTE — Progress Notes (Signed)
Progress Note  Patient Name: [name] [name] Date of Encounter: 07/05/2020  Primary Cardiologist: Sanda Klein, MD   Subjective   Breathing is OK   Pt denies CP    Inpatient Medications    Scheduled Meds:  Chlorhexidine Gluconate Cloth  6 each Topical Daily   enoxaparin (LOVENOX) injection  30 mg Subcutaneous Q24H   fluticasone  1 spray Each Nare Daily   fluticasone furoate-vilanterol  1 puff Inhalation Daily   folic acid  1 mg Oral Daily   furosemide  60 mg Oral Daily   gabapentin  300 mg Oral BID   hydrALAZINE  25 mg Oral BID   isosorbide mononitrate  15 mg Oral q morning - 10a   levothyroxine  75 mcg Oral QAC breakfast   metoprolol succinate  100 mg Oral Daily   pravastatin  40 mg Oral Daily   sodium chloride flush  10-40 mL Intracatheter Q12H   sodium chloride flush  3 mL Intravenous Q12H   [START ON 07/07/2020] warfarin  10 mg Oral Once per day on Sun Mon Wed Thu Fri Sat   warfarin  15 mg Oral ONCE-1600   [START ON 07/06/2020] warfarin  5 mg Oral Once per day on Tue   Warfarin - Pharmacist Dosing Inpatient   Does not apply q1600   Continuous Infusions:  sodium chloride     levETIRAcetam 500 mg (07/05/20 0812)   norepinephrine (LEVOPHED) Adult infusion     sodium chloride     PRN Meds: sodium chloride, acetaminophen, albuterol, ondansetron (ZOFRAN) IV, polyethylene glycol powder, senna, sodium chloride flush, sodium chloride flush   Vital Signs    Vitals:   07/05/20 0800 07/05/20 1100 07/05/20 1200 07/05/20 1300  BP: (!) 93/41  (!) 118/36 (!) 104/32  Pulse: 60  60 60  Resp: (!) 22  (!) 37 (!) 25  Temp:  98 F (36.7 C)    TempSrc:      SpO2: 99%  98% 95%  Weight:      Height:        Intake/Output Summary (Last 24 hours) at 07/05/2020 1524 Last data filed at 07/05/2020 0500 Gross per 24 hour  Intake 461.36 ml  Output 900 ml  Net -438.64 ml    Last 3 Weights 07/05/2020 07/03/2020 07/02/2020  Weight (lbs) 198 lb 13.7 oz 190 lb  0.6 oz 191 lb 2.2 oz  Weight (kg) 90.2 kg 86.2 kg 86.7 kg      Telemetry    Ventricular paced  60s   - Personally Reviewed  ECG    No new tracings.   Physical Exam   General: Well developed, elderly female appearing in no acute distress. Head: Normocephalic, atraumatic.  Neck: JVP is normal  Lungs:  Resp regular and unlabored, CTA without rales  Heart: RRR  No murmurs Crisp valve sounds Abdomen: Soft, nontender   No hepatomegaly   Extremities: No clubbing or cyanosis, triv LE edema   Neuro: Alert and oriented X 3. Moves all extremities spontaneously. Psych: Normal affect.  Labs    Chemistry Recent Labs  Lab 07/01/20 0545 07/02/20 0648 07/03/20 0621 07/03/20 1622 07/05/20 0457  NA 138   < > 138 138 138  K 4.0   < > 3.8 4.0 3.6  CL 93*   < > 94* 94* 97*  CO2 34*   < > 35* 27 32  GLUCOSE 81   < > 91 225* 90  BUN 55*   < > 61* 55* 62*  CREATININE 1.45*   < > 1.69* 1.79* 1.86*  CALCIUM 9.3   < > 8.7* 8.6* 8.4*  PROT 6.9  --   --  6.9 6.2*  ALBUMIN 3.2*  --   --  3.0* 2.6*  AST 22  --   --  43* 26  ALT 13  --   --  19 15  ALKPHOS 50  --   --  57 45  BILITOT 0.9  --   --  0.6 1.0  GFRNONAA 33*   < > 27* 25* 24*  ANIONGAP 11   < > 9 17* 9   < > = values in this interval not displayed.     Hematology Recent Labs  Lab 07/03/20 1622 07/04/20 0625 07/05/20 0457  WBC 9.0 12.3* 9.4  RBC 3.29* 3.20* 2.78*  HGB 9.7* 9.3* 8.1*  HCT 32.3* 29.1* 26.2*  MCV 98.2 90.9 94.2  MCH 29.5 29.1 29.1  MCHC 30.0 32.0 30.9  RDW 17.0* 16.9* 17.2*  PLT 205 182 157    Cardiac EnzymesNo results for input(s): TROPONINI in the last 168 hours. No results for input(s): TROPIPOC in the last 168 hours.   BNP Recent Labs  Lab 06/29/20 2002  BNP 569.0*     DDimer No results for input(s): DDIMER in the last 168 hours.   Radiology    CT HEAD WO CONTRAST  Result Date: 07/03/2020 CLINICAL DATA:  Seizure.  Unresponsive. EXAM: CT HEAD WITHOUT CONTRAST TECHNIQUE: Contiguous axial  images were obtained from the base of the skull through the vertex without intravenous contrast. COMPARISON:  CT head 03/27/2017. FINDINGS: Despite efforts by the technologist and patient, mild motion artifact is present on today's exam and could not be eliminated. This reduces exam sensitivity and specificity. Brain: There is no evidence of acute intracranial hemorrhage, mass lesion, brain edema or extra-axial fluid collection. Mildly progressive atrophy with prominence of the ventricles and subarachnoid spaces. There is mild low-density in the periventricular white matter, likely due to chronic small vessel ischemic changes. There is no CT evidence of acute cortical infarction. Vascular: Intracranial vascular calcifications. No hyperdense vessel identified. Skull: Negative for fracture or focal lesion. Sinuses/Orbits: Opacified left division of the sphenoid sinus. The additional paranasal sinuses, mastoid air cells and middle ears are clear. No acute orbital findings. Probable previous lens surgery bilaterally. Other: None. IMPRESSION: 1. No acute intracranial findings. 2. Mildly progressive atrophy and chronic small vessel ischemic changes. 3. Left sphenoid sinus disease. Electronically Signed   By: Richardean Sale M.D.   On: 07/03/2020 17:20   DG Chest Port 1 View  Result Date: 07/04/2020 CLINICAL DATA:  86 year old female with CHF. Central line placement. EXAM: PORTABLE CHEST 1 VIEW COMPARISON:  Chest radiograph dated 07/03/2020. FINDINGS: Interval placement of right subclavian central venous line with tip over central SVC close to the cavoatrial junction. Left lung base opacity most consistent with infiltrate. Clinical correlation and follow-up recommended. There is diffuse interstitial prominence which may be chronic or represent edema. No large pleural effusion or pneumothorax. Stable cardiomegaly. Left pectoral AICD device. Median sternotomy wires and mechanical cardiac valve. Atherosclerotic  calcification of the aorta. Degenerative changes of the spine. No acute osseous pathology. IMPRESSION: 1. Interval placement of right subclavian central venous line with tip over central SVC. No pneumothorax. 2. Left lung base infiltrate. Electronically Signed   By: Anner Crete M.D.   On: 07/04/2020 18:27   DG Chest Port 1 View  Result Date: 07/03/2020 CLINICAL DATA:  86 year old female  with seizure. EXAM: PORTABLE CHEST 1 VIEW COMPARISON:  Chest radiograph dated 06/30/2020. FINDINGS: Evaluation is limited due to patient's rotation. There is stable cardiomegaly with vascular congestion. Diffuse interstitial and peribronchial densities may represent edema although atypical infection is not excluded. Clinical correlation is recommended. No large pleural effusion or pneumothorax. Median sternotomy wires and left pectoral AICD device. Atherosclerotic calcification of the aorta. No acute osseous pathology. IMPRESSION: Stable cardiomegaly with vascular congestion. Pneumonia is not excluded. Electronically Signed   By: Anner Crete M.D.   On: 07/03/2020 17:34    Cardiac Studies   May 2021  Echo   1. Left ventricular ejection fraction, by estimation, is 30 to 35%. The left ventricle has moderately decreased function. The left ventricle demonstrates global hypokinesis. There is mild concentric left ventricular hypertrophy. Left ventricular diastolic function could not be evaluated. 2. Right ventricular systolic function is normal. The right ventricular size is normal. There is severely elevated pulmonary artery systolic pressure. The estimated right ventricular systolic pressure is 45.6 mmHg. 3. Left atrial size was severely dilated. 4. Right atrial size was moderately dilated. 5. The mitral valve has been repaired/replaced. No evidence of mitral valve regurgitation. 6. The aortic valve has been repaired/replaced. Aortic valve regurgitation is not visualized.  Patient Profile     86 y.o.  female w/PMH of CHB (s/p Medtronic BiV PPMin 01/2019), persistent atrial fibrillation/flutter, chronic combined systolic and diastolic CHF (EF previously as low as 20-25%, normalized by repeat echo in 2019 and 02/2019, EF 30-35% by echocardiogram in 01/2020), mechanical AVR in 2001 and mechanical MVR in 2004, HTN, HLD, Type 2 DM, COPDand Stage 3 CKD  She was about to be discharged last week when she developed a seizures  Was initially hypotensive requiring fluid boluses and pressors  Now off of pressors   Assessment & Plan    1. Acute on Chronic Combined Systolic and Diastolic CHF Volume status is not too bad   Keep on same regimen     2  Seizures  New  Being seen by neuro  On antiepileptic Rx   Work up in progress  3. CHB - She iss/p Medtronic BiV PPMin 01/2019. Followed by Dr. Sallyanne Kuster as an outpatient.   4. Persistent atrial fibrillation/flutter - V-paced  Continues on anticoagulatoin  4 5. Valvular Heart Disease - She is s/p mechanical AVR in 2001 and mechanical MVR in 2004. No significant valve abnormalities by echo in 01/2020. Repeat INR pending for this AM.     6. HTN BP is very labile     Low/high/low  Off of pressores  Will continue to follow  No changes for now    6. Stage 3 CKD - Baseline creatinine 1.4 - 1.5. At 1.73 on admission, improved to 1.45 on 10/7. Repeat BMET pending for this AM.   7. COPD - On 2L at baseline at baseline but has required 3L this admission. Saturations have remained appropriate. Will ask nursing staff to wean to her prior 2L.   8. Peripheral Neuropathy - She has been restarted on Gabapentin 300mg  BID. Was on this as an outpatient.    From a cardiac standpoint, her volume status has significantly improved. Will ask for a standing weight to be obtained today but can likely transition back to PO Lasix 60mg  daily. Will ask for PT evaluation as she has not been out of bed in 3 days and lives by herself. Will arrange outpatient Cardiology  follow-up.    For questions or updates, please contact  [facility] Please consult [email or web address] for contact info under Cardiology/STEMI.   Richrd Humbles , PA-C 3:24 PM 07/05/2020 Pager: [phone]   Attending note:  Hospital course reviewed, case discussed with Ms. Delano Metz, I agree with her above findings and documentation.  Ms. Hougland has shown improvement in overall volume status and I agree with switching to oral Lasix 60 mg daily.  Creatinine has bumped up from 1.45-1.87 with IV diuresis and the we will therefore make switch to oral Lasix beginning tomorrow and stop her IV Lasix for this evening.  Heart rate is in the 60s with ventricular pacing.  She remains on Toprol-XL and Coumadin with history of atrial fibrillation/flutter.  INR is 2.4.  She also has a mechanical AVR in place.  Her cardiac regimen includes hydralazine, Imdur, and Pravachol.  She is not on ARB or ACE inhibitor due to fluctuating renal insufficiency.  Increase activity with PT evaluation as suggested.  She lives alone.  Satira Sark, M.D., F.A.C.C.

## 2020-07-05 NOTE — Progress Notes (Signed)
No ICM remote transmission received for 07/05/2020 due to hospitalization and next ICM transmission scheduled for 07/26/2020.

## 2020-07-05 NOTE — Progress Notes (Signed)
EEG complete - results pending 

## 2020-07-06 ENCOUNTER — Encounter (HOSPITAL_COMMUNITY): Payer: Self-pay | Admitting: Family Medicine

## 2020-07-06 DIAGNOSIS — I5042 Chronic combined systolic (congestive) and diastolic (congestive) heart failure: Secondary | ICD-10-CM | POA: Diagnosis not present

## 2020-07-06 DIAGNOSIS — I4819 Other persistent atrial fibrillation: Secondary | ICD-10-CM

## 2020-07-06 DIAGNOSIS — Z515 Encounter for palliative care: Secondary | ICD-10-CM

## 2020-07-06 DIAGNOSIS — Z7189 Other specified counseling: Secondary | ICD-10-CM

## 2020-07-06 DIAGNOSIS — R569 Unspecified convulsions: Secondary | ICD-10-CM

## 2020-07-06 DIAGNOSIS — I509 Heart failure, unspecified: Secondary | ICD-10-CM

## 2020-07-06 LAB — GLUCOSE, CAPILLARY
Glucose-Capillary: 101 mg/dL — ABNORMAL HIGH (ref 70–99)
Glucose-Capillary: 106 mg/dL — ABNORMAL HIGH (ref 70–99)
Glucose-Capillary: 108 mg/dL — ABNORMAL HIGH (ref 70–99)
Glucose-Capillary: 84 mg/dL (ref 70–99)
Glucose-Capillary: 88 mg/dL (ref 70–99)
Glucose-Capillary: 95 mg/dL (ref 70–99)

## 2020-07-06 LAB — BASIC METABOLIC PANEL
Anion gap: 9 (ref 5–15)
BUN: 65 mg/dL — ABNORMAL HIGH (ref 8–23)
CO2: 31 mmol/L (ref 22–32)
Calcium: 8.3 mg/dL — ABNORMAL LOW (ref 8.9–10.3)
Chloride: 98 mmol/L (ref 98–111)
Creatinine, Ser: 1.89 mg/dL — ABNORMAL HIGH (ref 0.44–1.00)
GFR, Estimated: 24 mL/min — ABNORMAL LOW (ref >60)
Glucose, Bld: 86 mg/dL (ref 70–99)
Potassium: 3.7 mmol/L (ref 3.5–5.1)
Sodium: 138 mmol/L (ref 135–145)

## 2020-07-06 LAB — HEMOGLOBIN AND HEMATOCRIT, BLOOD
HCT: 25.4 % — ABNORMAL LOW (ref 36.0–46.0)
Hemoglobin: 8 g/dL — ABNORMAL LOW (ref 12.0–15.0)

## 2020-07-06 LAB — PROTIME-INR
INR: 2.3 — ABNORMAL HIGH (ref 0.8–1.2)
Prothrombin Time: 24.3 seconds — ABNORMAL HIGH (ref 11.4–15.2)

## 2020-07-06 LAB — MAGNESIUM: Magnesium: 2 mg/dL (ref 1.7–2.4)

## 2020-07-06 LAB — PHOSPHORUS: Phosphorus: 3.7 mg/dL (ref 2.5–4.6)

## 2020-07-06 MED ORDER — WARFARIN SODIUM 5 MG PO TABS
10.0000 mg | ORAL_TABLET | Freq: Once | ORAL | Status: AC
  Administered 2020-07-06: 10 mg via ORAL
  Filled 2020-07-06: qty 2

## 2020-07-06 MED ORDER — LEVETIRACETAM 500 MG PO TABS
500.0000 mg | ORAL_TABLET | Freq: Two times a day (BID) | ORAL | Status: DC
  Administered 2020-07-06 – 2020-07-08 (×4): 500 mg via ORAL
  Filled 2020-07-06 (×4): qty 1

## 2020-07-06 MED ORDER — FUROSEMIDE 40 MG PO TABS
40.0000 mg | ORAL_TABLET | Freq: Every day | ORAL | Status: DC
  Administered 2020-07-06 – 2020-07-08 (×3): 40 mg via ORAL
  Filled 2020-07-06 (×3): qty 1

## 2020-07-06 MED FILL — Medication: Qty: 1 | Status: AC

## 2020-07-06 NOTE — Progress Notes (Addendum)
ANTICOAGULATION CONSULT NOTE -   Pharmacy Consult for warfarin dosing  Indication: atrial fibrillation               Temp: 98.6 F (37 C) (10/12 0430) Temp Source: Oral (10/12 0430) BP: 156/54 (10/12 0600) Pulse Rate: 59 (10/12 0600)  Labs: Recent Labs    07/03/20 1622 07/03/20 1622 07/04/20 0625 07/04/20 0625 07/05/20 0457 07/06/20 0353  HGB 9.7*   < > 9.3*   < > 8.1* 8.0*  HCT 32.3*   < > 29.1*  --  26.2* 25.4*  PLT 205  --  182  --  157  --   LABPROT  --   --  20.4*  --  21.9* 24.3*  INR  --   --  1.8*  --  2.0* 2.3*  CREATININE 1.79*  --   --   --  1.86* 1.89*   < > = values in this interval not displayed.    Goal of Therapy:  INR 2.5-3.5 Monitor platelets by anticoagulation protocol: Yes   Home Dose: warfarin 10mg  DAILY except   5mg  onTuesdays.       Admit INR was 2.9 Lab Results  Component Value Date   INR 2.3 (H) 07/06/2020   INR 2.0 (H) 07/05/2020   INR 1.8 (H) 07/04/2020    Assessment: [name] [name] a 86 y.o. female requires anticoagulation with warfarin for the indication of  afib/aflutter with mechanical AVR and mechanical MVR. Warfarin will be initiated inpatient following pharmacy protocol per pharmacy consult. Patient most recent blood work is as follows: CBC Latest Ref Rng & Units 07/06/2020 07/05/2020 07/04/2020  WBC 4.0 - 10.5 K/uL - 9.4 12.3(H)  Hemoglobin 12.0 - 15.0 g/dL 8.0(L) 8.1(L) 9.3(L)  Hematocrit 36 - 46 % 25.4(L) 26.2(L) 29.1(L)  Platelets 150 - 400 K/uL - 157 182   INR is 2.3, almost at goal. Patient is being bridged with Lovenox 1mg /kg sq q24h. Likely able to discontinue 10/13   Plan: Warfarin 10mg  po  today  Monitor CBC daily with am labs   Monitor INR daily Monitor for signs and symptoms of bleeding   Isac Sarna, BS Vena [place], BCPS Clinical Pharmacist Pager [phone] 07/06/2020 8:12 AM

## 2020-07-06 NOTE — Progress Notes (Signed)
Report given to [facility], accepting RN on 300. Patient and all VS stable. Patient transported to 328 via wheelchair.

## 2020-07-06 NOTE — Consult Note (Signed)
Bishop A. Merlene Laughter, MD     [email or web address]          [name] [name] is an 86 y.o. female.   ASSESSMENT/PLAN: 1.  New onset seizures: EEG shows left temporal epileptiform discharges. The patient therefore may be it increased risk of having recurrent events. Keppra will be continued. MRI will be obtained of the brain. 2.  Nonischemic cardiomyopathy 3.  Left proptosis and chronic edema status post injury 4.  Edema left upper extremity 5.  Diabetes mellitus     No clinical seizures reported. She is doing fairly well.  She is sitting in the chair today.   GENERAL: The patient is laying in bed and appears slightly dyspneic.  HEENT: There is significant edema of the left eyelid with mild proptosis.  There is associated mild ptosis on the left side with some ptosis also on the right side.  ABDOMEN: soft  EXTREMITIES: There is mild edema of the left upper extremity.  There is significant arthritic changes noted of the extremities especially of the knees bilaterally.  BACK: Normal  SKIN: Normal by inspection.    MENTAL STATUS: She lays in bed with eyes closed but opens her eyes to verbal commands.  She follows commands well.  She knows she is in the hospital and mostly lucid at this time.    CRANIAL NERVES: Pupils are equal, round and reactive to light and accomodation; extra ocular movements are full, there is no significant nystagmus; visual fields are full; upper and lower facial muscles are normal in strength and symmetric, there is no flattening of the nasolabial folds; tongue is midline; uvula is midline; shoulder elevation is normal.  MOTOR: There is is moderate weakness of the left upper extremity graded as 3/5.  The other right upper extremity shows normal strength, bulk and tone.  The legs are graded as 3/5 with bulk and tone also normal.  COORDINATION: Left finger to nose is normal, right finger to nose is normal, No rest tremor; no intention tremor;  no postural tremor; no bradykinesia.  REFLEXES: Deep tendon reflexes are symmetrical and normal.    SENSATION: Normal to light touch, temperature, and pain.       Blood pressure (!) 105/44, pulse (!) 59, temperature 97.7 F (36.5 C), temperature source Oral, resp. rate (!) 27, height 5\' 7"  (1.702 m), weight 88.2 kg, SpO2 99 %.  Past Medical History:  Diagnosis Date   Acute on chronic diastolic CHF (congestive heart failure) ([place]) 05/19/2012   Acute on chronic respiratory failure with hypoxia ([place]) 01/31/2020   Adenomatous polyp of colon 12/16/2002   Dr. [name]/[facility]   Allergy    Calculus of gallbladder with chronic cholecystitis without obstruction 02/09/2009   Qualifier: Diagnosis of  By: [name], Margaret     Cataract    CHB (complete heart block) ([place]) 10/07/2014       CHF (congestive heart failure) ([place])    a.  EF previously reduced to 25-30% b. EF improved to 60-65% by echo in 10/2017.    Chronic kidney disease, stage I 2006   Constipation        COPD (chronic obstructive pulmonary disease) (H[facility])        DJD (degenerative joint disease) of lumbar spine    DM (diabetes mellitus) ([place]) 2006   Esophageal reflux    Glaucoma    Gout        H/O adenomatous polyp of colon 05/24/2012   2004 in [place]  [place] Last colonoscopy 2008 normal     H/O heart valve replacement with mechanical valve    a. s/p mechanical AVR in 2001 and mechanical MVR in 2004.   H/O wisdom tooth extraction    HIP PAIN, RIGHT 04/23/2009   Qualifier: Diagnosis of  By: [name], Margaret     Hyperlipemia    IDA (iron deficiency anemia)    Parenteral iron/[name]   Insomnia        Nausea without vomiting 06/07/2010   Qualifier: Diagnosis of  By: [name], Margaret     Non-ischemic cardiomyopathy [facility])    a. s/p MDT CRTD   Obesity, unspecified    Other and unspecified hyperlipidemia    Oxygen deficiency 2014   nocturnal;    Papilloma of breast 03/06/2012   This was diagnosed as a left breast papilloma by ultrasound characteristics and symptoms in 2010. Because of possible medical risk factors no surgical intervention has been done and it did doing annual followups. The area has been stable by physical examination and mammograms and ultrasound since 2010.    Spinal stenosis of lumbar region 02/19/2013   Spondylolisthesis    Spondylosis    Thyroid cancer ([place])    remote thyroidectomy, no recurrence, pt denies in 2016 thyroid cancer   Type 2 diabetes mellitus with hyperglycemia ([place]) 07/15/2010   Unspecified hypothyroidism        Varicose veins of lower extremities with other complications    Vertigo         Past Surgical History:  Procedure Laterality Date   AORTIC AND MITRAL VALVE REPLACEMENT  2001   BI-VENTRICULAR IMPLANTABLE CARDIOVERTER DEFIBRILLATOR UPGRADE N/A 01/18/2015   Procedure: BI-VENTRICULAR IMPLANTABLE CARDIOVERTER DEFIBRILLATOR UPGRADE;  Surgeon: Evans Lance, MD;  Location: [facility] CATH LAB;  Service: Cardiovascular;  Laterality: N/A;   BIV ICD GENERATOR CHANGEOUT N/A 01/30/2019   Procedure: BIV ICD GENERATOR CHANGEOUT;  Surgeon: Evans Lance, MD;  Location: [place] CV LAB;  Service: Cardiovascular;  Laterality: N/A;   CARDIAC CATHETERIZATION     CARDIAC VALVE REPLACEMENT     CARDIOVERSION N/A 11/13/2016   Procedure: CARDIOVERSION;  Surgeon: Sanda Klein, MD;  Location: [facility] ENDOSCOPY;  Service: Cardiovascular;  Laterality: N/A;   COLONOSCOPY  06/12/2007   Rourk- Normal rectum/Normal colon   COLONOSCOPY  12/16/02   small hemorrhoids   COLONOSCOPY  06/20/2012   Procedure: COLONOSCOPY;  Surgeon: Daneil Dolin, MD;  [facility];  Service: Endoscopy;  Laterality: N/A;  10:45   defibrillator implanted 2006     DOPPLER ECHOCARDIOGRAPHY  2012   DOPPLER ECHOCARDIOGRAPHY  05,06,07,08,09,2011   ESOPHAGOGASTRODUODENOSCOPY  06/12/2007   Rourk- normal esophagus, small  hiatal hernia, otherwise normal stomach, D1, D2   EYE SURGERY Right 2005   EYE SURGERY Left 2006   ICD LEAD REMOVAL Left 02/08/2015   Procedure: ICD LEAD REMOVAL;  Surgeon: Evans Lance, MD;  Location: [place];  Service: Cardiovascular;  Laterality: Left;  "Will plan extraction and insertion of a BiV PM"  **Dr. Roxan Hockey backing up case**   IMPLANTABLE CARDIOVERTER DEFIBRILLATOR (ICD) GENERATOR CHANGE Left 02/08/2015   Procedure: ICD GENERATOR CHANGE;  Surgeon: Evans Lance, MD;  Location: [place];  Service: Cardiovascular;  Laterality: Left;   INSERT / REPLACE / REMOVE PACEMAKER     PACEMAKER INSERTION  May 2016   pacemaker placed  2004   right breast cyst removed     benign    right cataract removed  2005   TEE WITHOUT CARDIOVERSION N/A 11/13/2016   Procedure: TRANSESOPHAGEAL ECHOCARDIOGRAM (TEE);  Surgeon: Sanda Klein, MD;  Location: [facility] ENDOSCOPY;  Service: Cardiovascular;  Laterality: N/A;   THYROIDECTOMY      Family History  Problem Relation Age of Onset   Pancreatic cancer Mother    Heart disease Father    Heart disease Sister    Heart attack Sister    Heart disease Brother    Diabetes Brother    Heart disease Brother    Diabetes Brother    Hypertension Brother    Lung cancer Brother     Social History:  reports that she quit smoking about 33 years ago. Her smoking use included cigarettes. She has a 30.00 pack-year smoking history. She has never used smokeless tobacco. She reports that she does not drink alcohol and does not use drugs.  Allergies:  Allergies  Allergen Reactions   Penicillins Other (See Comments)    Bruise     Aspirin Other (See Comments)    On coumadin   Fentanyl Dermatitis    Rash with patch    Niacin Itching    Medications: Prior to Admission medications   Medication Sig Start Date End Date Taking? Authorizing Provider  acetaminophen (TYLENOL) 500 MG tablet Take 500 mg by mouth every 6 (six) hours as needed  (pain).    Yes [provider]  albuterol (PROVENTIL) (2.5 MG/3ML) 0.083% nebulizer solution Take 3 mLs (2.5 mg total) by nebulization in the morning and at bedtime. *May use additional one time if needed for shortness of breath 04/02/20  Yes Johnson, Clanford L, MD  albuterol (VENTOLIN HFA) 108 (90 Base) MCG/ACT inhaler Inhale 1-2 puffs into the lungs every 6 (six) hours as needed for wheezing or shortness of breath. 04/02/20  Yes Johnson, Clanford L, MD  allopurinol (ZYLOPRIM) 100 MG tablet Take 1 tablet (100 mg total) by mouth in the morning. 04/02/20  Yes Johnson, Clanford L, MD  BREO ELLIPTA 100-25 MCG/INH AEPB 1 TAKE 1 INHALATION BY MOUTH ONCE DAILY 08/25/19  Yes Fayrene Helper, MD  cholecalciferol (VITAMIN D3) 25 MCG (1000 UT) tablet Take 1,000 Units by mouth in the morning.    Yes [provider]  docusate sodium (COLACE) 100 MG capsule Take 300 mg by mouth at bedtime.    Yes [provider]  fluticasone (FLONASE) 50 MCG/ACT nasal spray SHAKE LIQUID AND USE 2 SPRAYS IN EACH NOSTRIL DAILY 11/24/19  Yes Fayrene Helper, MD  folic acid (FOLVITE) 1 MG tablet TAKE 1 TABLET(1 MG) BY MOUTH DAILY 11/10/19  Yes Fayrene Helper, MD  furosemide (LASIX) 40 MG tablet Take 40 mg by mouth daily. Take 80mg  BID x3DAYS THEN BACK TO 40MG  BID   Yes [provider]  gabapentin (NEURONTIN) 300 MG capsule TAKE 1 CAPSULE(300 MG) BY MOUTH TWICE DAILY Patient taking differently: Take 300 mg by mouth daily.  06/14/20  Yes Fayrene Helper, MD  hydrALAZINE (APRESOLINE) 25 MG tablet Take 1 tablet (25 mg total) by mouth 2 (two) times daily. 06/22/20  Yes Croitoru, Mihai, MD  isosorbide mononitrate (IMDUR) 30 MG 24 hr tablet Take 0.5 tablets (15 mg total) by mouth every morning. 02/04/20  Yes Pokhrel, Laxman, MD  Liniments (SALONPAS PAIN RELIEF PATCH EX) Apply 1 patch topically daily as needed (pain).    Yes [provider]  meclizine (ANTIVERT) 12.5 MG tablet TAKE 1  TABLET(12.5 MG) BY MOUTH THREE TIMES DAILY AS NEEDED FOR DIZZINESS Patient taking differently: Take  12.5 mg by mouth 3 (three) times daily as needed for dizziness.  03/01/20  Yes Fayrene Helper, MD  metolazone (ZAROXOLYN) 2.5 MG tablet Take one tablet on Saturdays. Patient taking differently: Take 2.5 mg by mouth once a week. Take one tablet on Saturdays. 11/24/19  Yes Croitoru, Mihai, MD  metoprolol succinate (TOPROL-XL) 100 MG 24 hr tablet TAKE 1 TABLET BY MOUTH EVERY DAY WITH FOOD OR MILK Patient taking differently: Take 100 mg by mouth daily. TAKE 1 TABLET BY MOUTH EVERY DAY WITH FOOD OR MILK 05/05/20  Yes Fayrene Helper, MD  nitroGLYCERIN (NITROSTAT) 0.4 MG SL tablet Place 1 tablet (0.4 mg total) under the tongue every 5 (five) minutes as needed for chest pain. 08/03/19  Yes Rolland Porter, MD  ondansetron (ZOFRAN ODT) 4 MG disintegrating tablet Take 1 tablet (4 mg total) by mouth every 8 (eight) hours as needed for nausea or vomiting. 01/26/20  Yes Fawze, Mina A, PA-C  Polyethyl [name] (SYSTANE) 0.4-0.3 % GEL ophthalmic gel Place 1 application into the left eye at bedtime.   Yes [provider]  polyethylene glycol powder (GLYCOLAX/MIRALAX) 17 GM/SCOOP powder Take 17 g by mouth daily as needed for mild constipation or moderate constipation.   Yes [provider]  potassium chloride SA (KLOR-CON) 20 MEQ tablet Take 1 tablet (20 mEq total) by mouth daily. TAKE 40MEQ DAILY x3 DAYS THEN BACK TO 20MEQ DAILY Patient taking differently: Take 20 mEq by mouth daily.  04/23/20  Yes Cleaver, Jossie Ng, NP  pravastatin (PRAVACHOL) 40 MG tablet TAKE 1 TABLET BY MOUTH EVERY EVENING Patient taking differently: Take 40 mg by mouth daily.  11/10/19  Yes Fayrene Helper, MD  SYNTHROID 75 MCG tablet Take 1 tablet (75 mcg total) by mouth daily before breakfast. 04/02/20  Yes Johnson, Clanford L, MD  warfarin (JANTOVEN) 5 MG tablet Take 1-2 tablets (5-10 mg total) by mouth See admin  instructions. TAKE 2 TABLETS EVERY DAY EXCEPT 1 TABLET ON TUESDAYS @ 8:30PM 04/02/20  Yes Johnson, Clanford L, MD  benzonatate (TESSALON) 100 MG capsule Take 1 capsule (100 mg total) by mouth 2 (two) times daily as needed for cough. Patient not taking: Reported on 06/29/2020 05/11/20   Fayrene Helper, MD  furosemide (LASIX) 40 MG tablet Take 1.5 tablets (60 mg total) by mouth daily. 07/04/20   Shawna Clamp, MD    Scheduled Meds:  Chlorhexidine Gluconate Cloth  6 each Topical Daily   enoxaparin (LOVENOX) injection  90 mg Subcutaneous Q24H   fluticasone  1 spray Each Nare Daily   fluticasone furoate-vilanterol  1 puff Inhalation Daily   folic acid  1 mg Oral Daily   furosemide  40 mg Oral Daily   gabapentin  300 mg Oral BID   levETIRAcetam  500 mg Oral BID   levothyroxine  75 mcg Oral QAC breakfast   metoprolol succinate  100 mg Oral Daily   pravastatin  40 mg Oral Daily   sodium chloride flush  10-40 mL Intracatheter Q12H   sodium chloride flush  3 mL Intravenous Q12H   Warfarin - Pharmacist Dosing Inpatient   Does not apply q1600   Continuous Infusions:  sodium chloride     sodium chloride     PRN Meds:.sodium chloride, acetaminophen, albuterol, ondansetron (ZOFRAN) IV, polyethylene glycol powder, senna, sodium chloride flush, sodium chloride flush     Results for orders placed or performed during the hospital encounter of 06/29/20 (from the past 48 hour(s))  Glucose, capillary  Status: None   Collection Time: 07/04/20  8:27 PM  Result Value Ref Range   Glucose-Capillary 99 70 - 99 mg/dL    Comment: Glucose reference range applies only to samples taken after fasting for at least 8 hours.  Glucose, capillary     Status: None   Collection Time: 07/05/20 12:35 AM  Result Value Ref Range   Glucose-Capillary 98 70 - 99 mg/dL    Comment: Glucose reference range applies only to samples taken after fasting for at least 8 hours.  Glucose, capillary     Status:  None   Collection Time: 07/05/20  4:10 AM  Result Value Ref Range   Glucose-Capillary 76 70 - 99 mg/dL    Comment: Glucose reference range applies only to samples taken after fasting for at least 8 hours.   Comment 1 Notify RN    Comment 2 Document in Chart   Protime-INR     Status: Abnormal   Collection Time: 07/05/20  4:57 AM  Result Value Ref Range   Prothrombin Time 21.9 (H) 11.4 - 15.2 seconds   INR 2.0 (H) 0.8 - 1.2    Comment: (NOTE) INR goal varies based on device and disease states. Performed at [facility], [address] [place], [place] [address]   CBC     Status: Abnormal   Collection Time: 07/05/20  4:57 AM  Result Value Ref Range   WBC 9.4 4.0 - 10.5 K/uL   RBC 2.78 (L) 3.87 - 5.11 MIL/uL   Hemoglobin 8.1 (L) 12.0 - 15.0 g/dL   HCT 26.2 (L) 36 - 46 %   MCV 94.2 80.0 - 100.0 fL   MCH 29.1 26.0 - 34.0 pg   MCHC 30.9 30.0 - 36.0 g/dL   RDW 17.2 (H) 11.5 - 15.5 %   Platelets 157 150 - 400 K/uL   nRBC 0.0 0.0 - 0.2 %    Comment: Performed at [facility], [address], [place], [place] [address]  Comprehensive metabolic panel     Status: Abnormal   Collection Time: 07/05/20  4:57 AM  Result Value Ref Range   Sodium 138 135 - 145 mmol/L   Potassium 3.6 3.5 - 5.1 mmol/L   Chloride 97 (L) 98 - 111 mmol/L   CO2 32 22 - 32 mmol/L   Glucose, Bld 90 70 - 99 mg/dL    Comment: Glucose reference range applies only to samples taken after fasting for at least 8 hours.   BUN 62 (H) 8 - 23 mg/dL   Creatinine, Ser 1.86 (H) 0.44 - 1.00 mg/dL   Calcium 8.4 (L) 8.9 - 10.3 mg/dL   Total Protein 6.2 (L) 6.5 - 8.1 g/dL   Albumin 2.6 (L) 3.5 - 5.0 g/dL   AST 26 15 - 41 U/L   ALT 15 0 - 44 U/L   Alkaline Phosphatase 45 38 - 126 U/L   Total Bilirubin 1.0 0.3 - 1.2 mg/dL   GFR, Estimated 24 (L) >60 mL/min   Anion gap 9 5 - 15    Comment: Performed at [facility], [address], [place], [place] [address]  Magnesium     Status: None   Collection Time: 07/05/20  4:57 AM  Result  Value Ref Range   Magnesium 1.9 1.7 - 2.4 mg/dL    Comment: Performed at [facility], [address], [place], [place] [address]  Phosphorus     Status: None   Collection Time: 07/05/20  4:57 AM  Result Value  Ref Range   Phosphorus 3.6 2.5 - 4.6 mg/dL    Comment: Performed at [facility], [address], [place], [place] [address]  Glucose, capillary     Status: None   Collection Time: 07/05/20  5:24 AM  Result Value Ref Range   Glucose-Capillary 84 70 - 99 mg/dL    Comment: Glucose reference range applies only to samples taken after fasting for at least 8 hours.  Glucose, capillary     Status: None   Collection Time: 07/05/20  7:32 AM  Result Value Ref Range   Glucose-Capillary 87 70 - 99 mg/dL    Comment: Glucose reference range applies only to samples taken after fasting for at least 8 hours.  Glucose, capillary     Status: Abnormal   Collection Time: 07/05/20 11:19 AM  Result Value Ref Range   Glucose-Capillary 104 (H) 70 - 99 mg/dL    Comment: Glucose reference range applies only to samples taken after fasting for at least 8 hours.  Glucose, capillary     Status: None   Collection Time: 07/05/20  4:28 PM  Result Value Ref Range   Glucose-Capillary 95 70 - 99 mg/dL    Comment: Glucose reference range applies only to samples taken after fasting for at least 8 hours.  Lactic acid, plasma     Status: None   Collection Time: 07/05/20  4:56 PM  Result Value Ref Range   Lactic Acid, Venous 0.7 0.5 - 1.9 mmol/L    Comment: Performed at [facility], [address] [place], [place] [address]  Glucose, capillary     Status: Abnormal   Collection Time: 07/05/20  7:54 PM  Result Value Ref Range   Glucose-Capillary 164 (H) 70 - 99 mg/dL    Comment: Glucose reference range applies only to samples taken after fasting for at least 8 hours.   Comment 1 Notify RN    Comment 2 Document in Chart   Lactic acid, plasma     Status: None   Collection Time: 07/05/20  9:16 PM  Result Value Ref  Range   Lactic Acid, Venous 0.9 0.5 - 1.9 mmol/L    Comment: Performed at [facility], [address] [place], [place] [address]  Glucose, capillary     Status: Abnormal   Collection Time: 07/06/20 12:04 AM  Result Value Ref Range   Glucose-Capillary 108 (H) 70 - 99 mg/dL    Comment: Glucose reference range applies only to samples taken after fasting for at least 8 hours.   Comment 1 Notify RN    Comment 2 Document in Chart   Glucose, capillary     Status: None   Collection Time: 07/06/20  3:52 AM  Result Value Ref Range   Glucose-Capillary 84 70 - 99 mg/dL    Comment: Glucose reference range applies only to samples taken after fasting for at least 8 hours.   Comment 1 Notify RN    Comment 2 Document in Chart   Protime-INR     Status: Abnormal   Collection Time: 07/06/20  3:53 AM  Result Value Ref Range   Prothrombin Time 24.3 (H) 11.4 - 15.2 seconds   INR 2.3 (H) 0.8 - 1.2    Comment: (NOTE) INR goal varies based on device and disease states. Performed at [facility], [address], [place], [place] [address]   Basic metabolic panel     Status: Abnormal   Collection Time: 07/06/20  3:53 AM  Result Value Ref Range  Sodium 138 135 - 145 mmol/L   Potassium 3.7 3.5 - 5.1 mmol/L   Chloride 98 98 - 111 mmol/L   CO2 31 22 - 32 mmol/L   Glucose, Bld 86 70 - 99 mg/dL    Comment: Glucose reference range applies only to samples taken after fasting for at least 8 hours.   BUN 65 (H) 8 - 23 mg/dL   Creatinine, Ser 1.89 (H) 0.44 - 1.00 mg/dL   Calcium 8.3 (L) 8.9 - 10.3 mg/dL   GFR, Estimated 24 (L) >60 mL/min   Anion gap 9 5 - 15    Comment: Performed at [facility], [address] [place], [place] [address]  Magnesium     Status: None   Collection Time: 07/06/20  3:53 AM  Result Value Ref Range   Magnesium 2.0 1.7 - 2.4 mg/dL    Comment: Performed at [facility], [address] [place], [place] [address]  Phosphorus     Status: None   Collection Time: 07/06/20  3:53 AM    Result Value Ref Range   Phosphorus 3.7 2.5 - 4.6 mg/dL    Comment: Performed at [facility], [address], [place], [place] [address]  Hemoglobin and hematocrit, blood     Status: Abnormal   Collection Time: 07/06/20  3:53 AM  Result Value Ref Range   Hemoglobin 8.0 (L) 12.0 - 15.0 g/dL   HCT 25.4 (L) 36 - 46 %    Comment: Performed at [facility], [address], [place],  [address]  Glucose, capillary     Status: None   Collection Time: 07/06/20  8:00 AM  Result Value Ref Range   Glucose-Capillary 88 70 - 99 mg/dL    Comment: Glucose reference range applies only to samples taken after fasting for at least 8 hours.  Glucose, capillary     Status: Abnormal   Collection Time: 07/06/20 11:23 AM  Result Value Ref Range   Glucose-Capillary 106 (H) 70 - 99 mg/dL    Comment: Glucose reference range applies only to samples taken after fasting for at least 8 hours.  Glucose, capillary     Status: None   Collection Time: 07/06/20  5:31 PM  Result Value Ref Range   Glucose-Capillary 95 70 - 99 mg/dL    Comment: Glucose reference range applies only to samples taken after fasting for at least 8 hours.   *Note: Due to a large number of results and/or encounters for the requested time period, some results have not been displayed. A complete set of results can be found in Results Review.    Studies/Results:  HEAD CT FINDINGS: Despite efforts by the technologist and patient, mild motion artifact is present on today's exam and could not be eliminated. This reduces exam sensitivity and specificity.  Brain: There is no evidence of acute intracranial hemorrhage, mass lesion, brain edema or extra-axial fluid collection. Mildly progressive atrophy with prominence of the ventricles and subarachnoid spaces. There is mild low-density in the periventricular white matter, likely due to chronic small vessel ischemic changes. There is no CT evidence of acute  cortical infarction.  Vascular: Intracranial vascular calcifications. No hyperdense vessel identified.  Skull: Negative for fracture or focal lesion.  Sinuses/Orbits: Opacified left division of the sphenoid sinus. The additional paranasal sinuses, mastoid air cells and middle ears are clear. No acute orbital findings. Probable previous lens surgery bilaterally.  Other: None.  IMPRESSION: 1. No acute intracranial findings. 2. Mildly progressive atrophy and chronic small vessel  ischemic changes. 3. Left sphenoid sinus disease     Jaqualyn Juday A. Merlene Laughter, M.D.  Diplomate, Tax adviser of Psychiatry and Neurology ( Neurology). 07/06/2020, 6:00 PM

## 2020-07-06 NOTE — Progress Notes (Signed)
Progress Note  Patient Name: [name] [name] Date of Encounter: 07/06/2020  Primary Cardiologist: Sanda Klein, MD  Subjective   Reports no shortness of breath, palpitations, or chest pain this morning.  Inpatient Medications    Scheduled Meds: . Chlorhexidine Gluconate Cloth  6 each Topical Daily  . enoxaparin (LOVENOX) injection  90 mg Subcutaneous Q24H  . fluticasone  1 spray Each Nare Daily  . fluticasone furoate-vilanterol  1 puff Inhalation Daily  . folic acid  1 mg Oral Daily  . furosemide  60 mg Oral Daily  . gabapentin  300 mg Oral BID  . hydrALAZINE  25 mg Oral BID  . isosorbide mononitrate  15 mg Oral q morning - 10a  . levothyroxine  75 mcg Oral QAC breakfast  . metoprolol succinate  100 mg Oral Daily  . pravastatin  40 mg Oral Daily  . sodium chloride flush  10-40 mL Intracatheter Q12H  . sodium chloride flush  3 mL Intravenous Q12H  . warfarin  10 mg Oral ONCE-1600  . Warfarin - Pharmacist Dosing Inpatient   Does not apply q1600   Continuous Infusions: . sodium chloride    . levETIRAcetam 500 mg (07/05/20 2218)  . norepinephrine (LEVOPHED) Adult infusion    . sodium chloride     PRN Meds: sodium chloride, acetaminophen, albuterol, ondansetron (ZOFRAN) IV, polyethylene glycol powder, senna, sodium chloride flush, sodium chloride flush   Vital Signs    Vitals:   07/06/20 0430 07/06/20 0500 07/06/20 0600 07/06/20 0749  BP:  (!) 120/53 (!) 156/54   Pulse:  (!) 59 (!) 59   Resp:  (!) 31 (!) 25   Temp: 98.6 F (37 C)     TempSrc: Oral     SpO2:  100% 100% 100%  Weight: 88.2 kg     Height:       No intake or output data in the 24 hours ending 07/06/20 0850 Filed Weights   07/03/20 1801 07/05/20 0500 07/06/20 0430  Weight: 86.2 kg 90.2 kg 88.2 kg    Telemetry    Ventricular pacing with underlying atrial fibrillation.  Personally reviewed.  ECG    An ECG dated 07/03/2020 was personally reviewed today and demonstrated:  Ventricular paced  rhythm with underlying atrial fibrillation.  Physical Exam   GEN:  Elderly woman, no acute distress.   Neck: No JVD. Cardiac: RRR, 2/6 systolic murmur.  Respiratory: Nonlabored. Clear to auscultation bilaterally. GI: Soft, nontender, bowel sounds present. MS: No edema.  Labs    Chemistry Recent Labs  Lab 07/01/20 0545 07/02/20 0648 07/03/20 1622 07/05/20 0457 07/06/20 0353  NA 138   < > 138 138 138  K 4.0   < > 4.0 3.6 3.7  CL 93*   < > 94* 97* 98  CO2 34*   < > 27 32 31  GLUCOSE 81   < > 225* 90 86  BUN 55*   < > 55* 62* 65*  CREATININE 1.45*   < > 1.79* 1.86* 1.89*  CALCIUM 9.3   < > 8.6* 8.4* 8.3*  PROT 6.9  --  6.9 6.2*  --   ALBUMIN 3.2*  --  3.0* 2.6*  --   AST 22  --  43* 26  --   ALT 13  --  19 15  --   ALKPHOS 50  --  57 45  --   BILITOT 0.9  --  0.6 1.0  --   GFRNONAA 33*   < >  25* 24* 24*  ANIONGAP 11   < > 17* 9 9   < > = values in this interval not displayed.     Hematology Recent Labs  Lab 07/03/20 1622 07/03/20 1622 07/04/20 0625 07/05/20 0457 07/06/20 0353  WBC 9.0  --  12.3* 9.4  --   RBC 3.29*  --  3.20* 2.78*  --   HGB 9.7*   < > 9.3* 8.1* 8.0*  HCT 32.3*   < > 29.1* 26.2* 25.4*  MCV 98.2  --  90.9 94.2  --   MCH 29.5  --  29.1 29.1  --   MCHC 30.0  --  32.0 30.9  --   RDW 17.0*  --  16.9* 17.2*  --   PLT 205  --  182 157  --    < > = values in this interval not displayed.    Cardiac Enzymes Recent Labs  Lab 06/29/20 2002 06/30/20 0500 07/03/20 1622 07/03/20 1759  TROPONINIHS 28* 30* 55* 146*    BNP Recent Labs  Lab 06/29/20 2002  BNP 569.0*     Radiology    DG Chest Port 1 View  Result Date: 07/04/2020 CLINICAL DATA:  86 year old female with CHF. Central line placement. EXAM: PORTABLE CHEST 1 VIEW COMPARISON:  Chest radiograph dated 07/03/2020. FINDINGS: Interval placement of right subclavian central venous line with tip over central SVC close to the cavoatrial junction. Left lung base opacity most consistent with  infiltrate. Clinical correlation and follow-up recommended. There is diffuse interstitial prominence which may be chronic or represent edema. No large pleural effusion or pneumothorax. Stable cardiomegaly. Left pectoral AICD device. Median sternotomy wires and mechanical cardiac valve. Atherosclerotic calcification of the aorta. Degenerative changes of the spine. No acute osseous pathology. IMPRESSION: 1. Interval placement of right subclavian central venous line with tip over central SVC. No pneumothorax. 2. Left lung base infiltrate. Electronically Signed   By: Anner Crete M.D.   On: 07/04/2020 18:27    Cardiac Studies   Echocardiogram 01/31/2020: 1. Left ventricular ejection fraction, by estimation, is 30 to 35%. The  left ventricle has moderately decreased function. The left ventricle  demonstrates global hypokinesis. There is mild concentric left ventricular  hypertrophy. Left ventricular  diastolic function could not be evaluated.  2. Right ventricular systolic function is normal. The right ventricular  size is normal. There is severely elevated pulmonary artery systolic  pressure. The estimated right ventricular systolic pressure is 19.4 mmHg.  3. Left atrial size was severely dilated.  4. Right atrial size was moderately dilated.  5. The mitral valve has been repaired/replaced. No evidence of mitral  valve regurgitation.  6. The aortic valve has been repaired/replaced. Aortic valve  regurgitation is not visualized.   Patient Profile     86 y.o. female with a history of CHB (s/p Medtronic BiV PPMin 01/2019), persistent atrial fibrillation/flutter, chronic combined systolic and diastolic CHF (EF previously as low as 20-25%, normalized by repeat echo in 2019 and 02/2019, EF 30-35% by echocardiogram in 01/2020), mechanical AVR in 2001 and mechanical MVR in 2004, HTN, HLD, Type 2 DM, COPDand Stage 3 CKD.  Assessment & Plan    1.  Chronic combined heart failure status post  recent exacerbation and improvement with IV diuresis.  Oral medications held recently in the setting of hypotension.  Recent intake/output incomplete.  LVEF 30 to 35% by most recent assessment.  2.  Persistent atrial fibrillation, CHA2DS2-VASc score is 6.  She is asymptomatic in terms  of palpitations with ventricular pacing at baseline and controlled heart rate.  Axis with management by pharmacy.  INR 2.3.  3.  Valvular heart disease status post mechanical AVR and MVR.  4.  Complete heart block with Medtronic biventricular in place.  5.  CKD stage IIIb-IV.  Recent creatinine 1.79-1.89.  6.  Recent seizure with subsequent hypotension, transient use of pressors and fluid boluses, neurology work-up underway.  She is currently on Keppra with EEG pending.  I reviewed hospital course since my rounds last week.  Systolics running mid 99J to 150s.  Continue Coumadin per pharmacy.  Continue Toprol-XL at present dose along with Pravachol.  Would hold hydralazine/Imdur for the time being and then resume low dose as blood pressure further stabilizes.  Change Lasix to 40 mg daily.  Not on potassium supplement at this time.  As noted previously, we are holding off on ARB/ARI/Aldactone due to fluctuating renal insufficiency.  Signed, Rozann Lesches, MD  07/06/2020, 8:50 AM

## 2020-07-06 NOTE — TOC Progression Note (Signed)
Transition of Care [facility]) - Progression Note   Patient Details  Name: Ranae Casebier MRN: [id number] Date of Birth: [birth year]  Transition of Care [facility]) CM/SW [place], LCSW Phone Number: 07/06/2020, 3:55 PM  Clinical Narrative: PT evaluation recommends SNF. CSW spoke with patient's nephew, [name], and patient's niece-in-law regarding PT recommendations. Per nephew and NIL, they think patient will need SNF as they both work third shift and are concerned about patient's safety returning home due to her weakness and being home alone overnight. CSW completed FL2 and faxed out referrals. Family would prefer [place] area SNFs as the family lives local. TOC to follow.  Expected Discharge Plan: [place] Barriers to Discharge: Barriers Resolved  Expected Discharge Plan and Services Expected Discharge Plan: [place] In-house Referral: Clinical Social Work Discharge Planning Services: NA Post Acute Care Choice: [place] arrangements for the past 2 months: Single Family Home Expected Discharge Date: 07/03/20               HH Arranged: PT, OT HH Agency: [place] ([facility]) Date [place]: 07/10/20 Time [place]: [phone] Representative spoke with at [place]: [place]  Readmission Risk Interventions Readmission Risk Prevention Plan 04/01/2020 04/01/2020 02/03/2020  Transportation Screening Complete Complete Complete  PCP or Specialist Appt within 3-5 Days - - -  HRI or [employer] for [place] - - -  Medication Review (RN Care Manager) Complete - Referral to Pharmacy  PCP or Specialist appointment within 3-5 days of discharge - - Complete  HRI or Home Care Consult Complete Complete Complete  SW Recovery Care/Counseling Consult Complete - Complete  Palliative Care Screening Not Applicable Not Applicable Not  [place] Not Applicable Not Applicable Not Applicable  Some recent data might be hidden

## 2020-07-06 NOTE — Procedures (Signed)
Patient Name: [name] [name]  MRN: [id number]  Epilepsy Attending: Lora Havens  Referring Physician/Provider: Dr. Shanda Howells Date: 07/05/2020 Duration: 22.41 minutes  Patient history: 86 year old female with new onset seizures.  EEG evaluate for seizures.  Level of alertness: Awake  AEDs during EEG study: Gabapentin, Keppra  Technical aspects: This EEG study was done with scalp electrodes positioned according to the 10-20 International system of electrode placement. Electrical activity was acquired at a sampling rate of 500Hz  and reviewed with a high frequency filter of 70Hz  and a low frequency filter of 1Hz . EEG data were recorded continuously and digitally stored.   Description: No clear posterior dominant rhythm was seen.  EEG showed generalized polymorphic mixed frequencies with predominantly 6 to 9 Hz theta-alpha activity with intermittent 2 to 3 Hz generalized delta activity.  Sharp waves were seen in left temporal region. Hyperventilation and photic stimulation were not performed.     ABNORMALITY -Sharp waves, left temporal region - Continuous slow, generalized  IMPRESSION: This study showed evidence of epileptogenic city arising from left temporal region as well as mild diffuse encephalopathy, nonspecific etiology.  No seizures were seen throughout the recording.  Rafeal Skibicki [name] Sarks

## 2020-07-06 NOTE — Consult Note (Signed)
Consultation Note Date: 07/06/2020   Patient Name: [name] [name]  DOB: Mar 30, 1932  MRN: [id number]  Age / Sex: 86 y.o., female  PCP: Fayrene Helper, MD Referring Physician: Shawna Clamp, MD  Reason for Consultation: Establishing goals of care and Psychosocial/spiritual support  HPI/Patient Profile: 86 y.o. female  with past medical history of hypothyroid, nonischemic cardiomyopathy/ICD/pacemaker 2016, HLD, GERD, CKD, CHF, aortic/mitral valve repair/replacement in 2001, chronic respiratory failure admitted on 06/29/2020 with admitted with acute on chronic systolic heart failure exacerbation, IV diuresis for a few days, and was discharged but while obtaining transport she had a sudden seizure.  Hypotension requiring pressor support briefly, BP improved seizure episode, CT head negative for acute abnormality neuro consult,.   Clinical Assessment and Goals of Care: [name] [name] is lying quietly in bed.  She is resting, but wakes easily when I call her name.  She is alert and oriented x3, able to make her needs known.  There is no family at bedside at this time.  She tells me she lives in her own home, with her nephew, [name] who helps around the house.  She tells me she has no children.  [name] [name] is able to tell me that she was told she had a seizure, but does not remember this.  She tells me that she feels ok, and her only question/complaint is that she cannot find her false teeth. I attempt to look for her teeth, but am unable to find them.    We talk about [name] [name] home life.  She tells me that she has no children, but her nephew [name] lives in her home and helps with house work.  At this point she has qualified for STR.   Conference with attending, bedside nursing staff and [facility] team related to patient condition, needs, and GOC.   HCPOA    NEXT OF KIN -[name] [name] tells me that her  nephew, [name], would be her surrogate decision-maker.    SUMMARY OF RECOMMENDATIONS   Continue to treat the treatable, but NO CPR or intubation  Qualified for STR Would benefit from out patient palliative care.    Code Status/Advance Care Planning:  DNR  Symptom Management:   Per hospitalist, no additional needs at this time.  Palliative Prophylaxis:   Frequent Pain Assessment, Oral Care and Turn Reposition  Additional Recommendations (Limitations, Scope, Preferences):  Treat the treatable but no CPR or intubation  Psycho-social/Spiritual:   Desire for further Chaplaincy support:no  Additional Recommendations: Caregiving  Support/Resources and Education on Hospice  Prognosis:   Unable to determine, based on outcomes.  6 months or less would not be surprising based on chronic illness burden, advancing age, 3 hospital visits and 5 ED visits in the last 6 months.  Discharge Planning: To be determined, recommended for home health services on 10/9.  May now qualify for short-term rehab      Primary Diagnoses: Present on Admission: **None**   I have reviewed the medical record, interviewed the patient and family, and examined  the patient. The following aspects are pertinent.  Past Medical History:  Diagnosis Date  . Acute on chronic diastolic CHF (congestive heart failure) ([place]) 05/19/2012  . Acute on chronic respiratory failure with hypoxia ([place]) 01/31/2020  . Adenomatous polyp of colon 12/16/2002   Dr. [name]/[facility]  . Allergy   . Calculus of gallbladder with chronic cholecystitis without obstruction 02/09/2009   Qualifier: Diagnosis of  By: [name], [name]    . Cataract   . CHB (complete heart block) ([place]) 10/07/2014      . CHF (congestive heart failure) ([place])    a.  EF previously reduced to 25-30% b. EF improved to 60-65% by echo in 10/2017.   Marland Kitchen Chronic kidney disease, stage I 2006  . Constipation       . COPD (chronic  obstructive pulmonary disease) ([place])       . DJD (degenerative joint disease) of lumbar spine   . DM (diabetes mellitus) ([place]) 2006  . Esophageal reflux   . Glaucoma   . Gout       . H/O adenomatous polyp of colon 05/24/2012   2004 in [place] colonoscopy 2008 normal    . H/O heart valve replacement with mechanical valve    a. s/p mechanical AVR in 2001 and mechanical MVR in 2004.  Marland Kitchen H/O wisdom tooth extraction   . HIP PAIN, RIGHT 04/23/2009   Qualifier: Diagnosis of  By: [name], [name]    . Hyperlipemia   . IDA (iron deficiency anemia)    Parenteral iron/[name]  . Insomnia       . Nausea without vomiting 06/07/2010   Qualifier: Diagnosis of  By: [name], [name]    . Non-ischemic cardiomyopathy ([place])    a. s/p MDT CRTD  . Obesity, unspecified   . Other and unspecified hyperlipidemia   . Oxygen deficiency 2014   nocturnal;  . Papilloma of breast 03/06/2012   This was diagnosed as a left breast papilloma by ultrasound characteristics and symptoms in 2010. Because of possible medical risk factors no surgical intervention has been done and it did doing annual followups. The area has been stable by physical examination and mammograms and ultrasound since 2010.   Marland Kitchen Spinal stenosis of lumbar region 02/19/2013  . Spondylolisthesis   . Spondylosis   . Thyroid cancer ([place])    remote thyroidectomy, no recurrence, pt denies in 2016 thyroid cancer  . Type 2 diabetes mellitus with hyperglycemia ([place]) 07/15/2010  . Unspecified hypothyroidism       . 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 Vertigo        Social History   Socioeconomic History  . Marital status: Widowed    Spouse name: Not on file  . Number of children: 0  . Years of education: Not on file  . Highest education level: Not on file  Occupational History  . Occupation: retired    Fish farm manager: RETIRED  Tobacco Use  . Smoking status: Former Smoker    Packs/day: 0.75    Years:  40.00    Pack years: 30.00    Types: Cigarettes    Quit date: 03/08/1987    Years since quitting: 33.3  . Smokeless tobacco: Never Used  Vaping Use  . Vaping Use: Never used  Substance and Sexual Activity  . Alcohol use: No    Alcohol/week: 0.0 standard drinks  . Drug use: No  . Sexual activity: Not Currently  Other Topics Concern  . Not on file  Social History Narrative   From [place] (2004)   Social Determinants of Health   Financial Resource Strain: Low Risk   . Difficulty of Paying Living Expenses: Not hard at all  Food Insecurity: No Food Insecurity  . Worried About Charity fundraiser in the Last Year: Never true  . Ran Out of Food in the Last Year: Never true  Transportation Needs: No Transportation Needs  . Lack of Transportation (Medical): No  . Lack of Transportation (Non-Medical): No  Physical Activity: Inactive  . Days of Exercise per Week: 0 days  . Minutes of Exercise per Session: 0 min  Stress: No Stress Concern Present  . Feeling of Stress : Only a little  Social Connections: Moderately Isolated  . Frequency of Communication with Friends and Family: More than three times a week  . Frequency of Social Gatherings with Friends and Family: Twice a week  . Attends Religious Services: More than 4 times per year  . Active Member of Clubs or Organizations: No  . Attends Archivist Meetings: Never  . Marital Status: Widowed   Family History  Problem Relation Age of Onset  . Pancreatic cancer Mother   . Heart disease Father   . Heart disease Sister   . Heart attack Sister   . Heart disease Brother   . Diabetes Brother   . Heart disease Brother   . Diabetes Brother   . Hypertension Brother   . Lung cancer Brother    Scheduled Meds: . Chlorhexidine Gluconate Cloth  6 each Topical Daily  . enoxaparin (LOVENOX) injection  90 mg Subcutaneous Q24H  . fluticasone  1 spray Each Nare Daily  . fluticasone furoate-vilanterol  1 puff Inhalation Daily  .  folic acid  1 mg Oral Daily  . furosemide  40 mg Oral Daily  . gabapentin  300 mg Oral BID  . levETIRAcetam  500 mg Oral BID  . levothyroxine  75 mcg Oral QAC breakfast  . metoprolol succinate  100 mg Oral Daily  . pravastatin  40 mg Oral Daily  . sodium chloride flush  10-40 mL Intracatheter Q12H  . sodium chloride flush  3 mL Intravenous Q12H  . warfarin  10 mg Oral ONCE-1600  . Warfarin - Pharmacist Dosing Inpatient   Does not apply q1600   Continuous Infusions: . sodium chloride    . sodium chloride     PRN Meds:.sodium chloride, acetaminophen, albuterol, ondansetron (ZOFRAN) IV, polyethylene glycol powder, senna, sodium chloride flush, sodium chloride flush Medications Prior to Admission:  Prior to Admission medications   Medication Sig Start Date End Date Taking? Authorizing Provider  acetaminophen (TYLENOL) 500 MG tablet Take 500 mg by mouth every 6 (six) hours as needed (pain).    Yes [provider]  albuterol (PROVENTIL) (2.5 MG/3ML) 0.083% nebulizer solution Take 3 mLs (2.5 mg total) by nebulization in the morning and at bedtime. *May use additional one time if needed for shortness of breath 04/02/20  Yes Johnson, Clanford L, MD  albuterol (VENTOLIN HFA) 108 (90 Base) MCG/ACT inhaler Inhale 1-2 puffs into the lungs every 6 (six) hours as needed for wheezing or shortness of breath. 04/02/20  Yes Johnson, Clanford L, MD  allopurinol (ZYLOPRIM) 100 MG tablet Take 1 tablet (100 mg total) by mouth in the morning. 04/02/20  Yes Johnson, Clanford L, MD  BREO ELLIPTA 100-25 MCG/INH AEPB 1 TAKE 1 INHALATION BY MOUTH ONCE DAILY 08/25/19  Yes Fayrene Helper, MD  cholecalciferol (VITAMIN D3) 25 MCG (1000 UT) tablet Take 1,000 Units by mouth in the morning.    Yes [provider]  docusate sodium (COLACE) 100 MG capsule Take 300 mg by mouth at bedtime.    Yes [provider]  fluticasone (FLONASE) 50 MCG/ACT nasal spray SHAKE LIQUID AND USE 2 SPRAYS IN EACH NOSTRIL  DAILY 11/24/19  Yes Fayrene Helper, MD  folic acid (FOLVITE) 1 MG tablet TAKE 1 TABLET(1 MG) BY MOUTH DAILY 11/10/19  Yes Fayrene Helper, MD  furosemide (LASIX) 40 MG tablet Take 40 mg by mouth daily. Take 80mg  BID x3DAYS THEN BACK TO 40MG  BID   Yes [provider]  gabapentin (NEURONTIN) 300 MG capsule TAKE 1 CAPSULE(300 MG) BY MOUTH TWICE DAILY Patient taking differently: Take 300 mg by mouth daily.  06/14/20  Yes Fayrene Helper, MD  hydrALAZINE (APRESOLINE) 25 MG tablet Take 1 tablet (25 mg total) by mouth 2 (two) times daily. 06/22/20  Yes Croitoru, Mihai, MD  isosorbide mononitrate (IMDUR) 30 MG 24 hr tablet Take 0.5 tablets (15 mg total) by mouth every morning. 02/04/20  Yes Pokhrel, Laxman, MD  Liniments (SALONPAS PAIN RELIEF PATCH EX) Apply 1 patch topically daily as needed (pain).    Yes [provider]  meclizine (ANTIVERT) 12.5 MG tablet TAKE 1 TABLET(12.5 MG) BY MOUTH THREE TIMES DAILY AS NEEDED FOR DIZZINESS Patient taking differently: Take 12.5 mg by mouth 3 (three) times daily as needed for dizziness.  03/01/20  Yes Fayrene Helper, MD  metolazone (ZAROXOLYN) 2.5 MG tablet Take one tablet on Saturdays. Patient taking differently: Take 2.5 mg by mouth once a week. Take one tablet on Saturdays. 11/24/19  Yes Croitoru, Mihai, MD  metoprolol succinate (TOPROL-XL) 100 MG 24 hr tablet TAKE 1 TABLET BY MOUTH EVERY DAY WITH FOOD OR MILK Patient taking differently: Take 100 mg by mouth daily. TAKE 1 TABLET BY MOUTH EVERY DAY WITH FOOD OR MILK 05/05/20  Yes Fayrene Helper, MD  nitroGLYCERIN (NITROSTAT) 0.4 MG SL tablet Place 1 tablet (0.4 mg total) under the tongue every 5 (five) minutes as needed for chest pain. 08/03/19  Yes Rolland Porter, MD  ondansetron (ZOFRAN ODT) 4 MG disintegrating tablet Take 1 tablet (4 mg total) by mouth every 8 (eight) hours as needed for nausea or vomiting. 01/26/20  Yes Fawze, Mina A, PA-C  Polyethyl [name] (SYSTANE) 0.4-0.3 %  GEL ophthalmic gel Place 1 application into the left eye at bedtime.   Yes [provider]  polyethylene glycol powder (GLYCOLAX/MIRALAX) 17 GM/SCOOP powder Take 17 g by mouth daily as needed for mild constipation or moderate constipation.   Yes [provider]  potassium chloride SA (KLOR-CON) 20 MEQ tablet Take 1 tablet (20 mEq total) by mouth daily. TAKE 40MEQ DAILY x3 DAYS THEN BACK TO 20MEQ DAILY Patient taking differently: Take 20 mEq by mouth daily.  04/23/20  Yes Cleaver, Jossie Ng, NP  pravastatin (PRAVACHOL) 40 MG tablet TAKE 1 TABLET BY MOUTH EVERY EVENING Patient taking differently: Take 40 mg by mouth daily.  11/10/19  Yes Fayrene Helper, MD  SYNTHROID 75 MCG tablet Take 1 tablet (75 mcg total) by mouth daily before breakfast. 04/02/20  Yes Johnson, Clanford L, MD  warfarin (JANTOVEN) 5 MG tablet Take 1-2 tablets (5-10 mg total) by mouth See admin instructions. TAKE 2 TABLETS EVERY DAY EXCEPT 1 TABLET ON TUESDAYS @ 8:30PM 04/02/20  Yes Murlean Iba, MD  benzonatate (TESSALON) 100 MG capsule Take 1 capsule (100 mg total) by mouth 2 (two) times daily as needed for cough. Patient not taking: Reported on 06/29/2020 05/11/20   Fayrene Helper, MD  furosemide (LASIX) 40 MG tablet Take 1.5 tablets (60 mg total) by mouth daily. 07/04/20   Shawna Clamp, MD   Allergies  Allergen Reactions  . Penicillins Other (See Comments)    Bruise    . Aspirin Other (See Comments)    On coumadin  . Fentanyl Dermatitis    Rash with patch   . Niacin Itching   Review of Systems  Unable to perform ROS: Age    Physical Exam Vitals and nursing note reviewed.  Constitutional:      General: She is not in acute distress.    Appearance: She is not ill-appearing.  HENT:     Head: Atraumatic.  Cardiovascular:     Rate and Rhythm: Normal rate.  Pulmonary:     Effort: Pulmonary effort is normal.  Abdominal:     Palpations: Abdomen is soft.  Skin:    General: Skin is warm  and dry.  Neurological:     Mental Status: She is alert and oriented to person, place, and time.  Psychiatric:        Mood and Affect: Mood normal.        Behavior: Behavior normal.     Vital Signs: BP (!) 105/44   Pulse (!) 59   Temp 97.7 F (36.5 C) (Oral)   Resp (!) 24   Ht 5\' 7"  (1.702 m)   Wt 88.2 kg   SpO2 98%   BMI 30.45 kg/m  Pain Scale: 0-10   Pain Score: 0-No pain   SpO2: SpO2: 98 % O2 Device:SpO2: 98 % O2 Flow Rate: .O2 Flow Rate (L/min): 3 L/min  IO: Intake/output summary:   Intake/Output Summary (Last 24 hours) at 07/06/2020 1330 Last data filed at 07/06/2020 8127 Gross per 24 hour  Intake 10 ml  Output 850 ml  Net -840 ml    LBM: Last BM Date: 07/02/20 Baseline Weight: Weight: 93 kg Most recent weight: Weight: 88.2 kg     Palliative Assessment/Data:   Flowsheet Rows     Most Recent Value  Intake Tab  Referral Department Hospitalist  Unit at Time of Referral ICU  Palliative Care Primary Diagnosis Neurology  Date Notified 07/05/20  Palliative Care Type New Palliative care  Reason for referral Clarify Goals of Care  Date of Admission 06/29/20  Date first seen by Palliative Care 07/06/20  # of days Palliative referral response time 1 Day(s)  # of days IP prior to Palliative referral 6  Clinical Assessment  Palliative Performance Scale Score 40%  Pain Max last 24 hours Not able to report  Pain Min Last 24 hours Not able to report  Dyspnea Max Last 24 Hours Not able to report  Dyspnea Min Last 24 hours Not able to report  Psychosocial & Spiritual Assessment  Palliative Care Outcomes      Time In: 1240 Time Out: 1330 Time Total: 50 minutes  Greater than 50%  of this time was spent counseling and coordinating care related to the above assessment and plan.  Signed by: Drue Novel, NP   Please contact Palliative Medicine Team phone at [phone] for questions and concerns.  For individual provider: See Shea Evans

## 2020-07-06 NOTE — Progress Notes (Signed)
Physical Therapy Treatment Patient Details Name: [name] [name] MRN: [id number] DOB: 1932-01-21 Today's Date: 07/06/2020    History of Present Illness [name] [name]  is a 86 y.o. female, with history of hypothyroidism, T2DM, Non-ischemic cardiomyopathy, HLD, GERD, CKD, CHF, aortic valve repair, chronic respiratory failure, presents to the ED with dyspnea. Patient reports that it started one week ago, and has been progressively worse since. She has doubled her own lasix dose at home, and reports increased urine output, but the dyspnea has continued. She has associated peripheral edema and 15 pound weight gain. She also reports orthopnea. Patient has no chest pain, no palpitations, fevers, or weakness. On ROS she does report a cough that is occasionally productive of sputum - it has been ongoing. She also reports constipation. No other concerns at this time.    PT Comments    Patient demonstrates slow labored movement for sitting up at bedside with poor return for using BUE to assist with bed mobility due to weakness.  Patient demonstrates fair/good return for completing BLE ROM/strengthening exercises with verbal cueing and demonstration, limited to a few steps at bedside due to fall risk with buckling of knees, generalized weakness and c/o fatigue.  Patient tolerated sitting up in chair after therapy - nursing staff notified.  Patient will benefit from continued physical therapy in hospital and recommended venue below to increase strength, balance, endurance for safe ADLs and gait.      Follow Up Recommendations  SNF     Equipment Recommendations  None recommended by PT    Recommendations for Other Services       Precautions / Restrictions Precautions Precautions: Fall Restrictions Weight Bearing Restrictions: No    Mobility  Bed Mobility Overal bed mobility: Needs Assistance Bed Mobility: Supine to Sit     Supine to sit: Mod assist     General bed mobility comments: slow  labored movement with diffiuclty pushing self up with BUE due to weakness  Transfers Overall transfer level: Needs assistance Equipment used: Rolling walker (2 wheeled) Transfers: Sit to/from [facility] Sit to Stand: Mod assist;Max assist Stand pivot transfers: Mod assist       General transfer comment: had difficulty with sit to stands due to BLE weakness  Ambulation/Gait Ambulation/Gait assistance: Mod assist Gait Distance (Feet): 6 Feet Assistive device: Rolling walker (2 wheeled) Gait Pattern/deviations: Decreased step length - right;Decreased step length - left;Decreased stride length Gait velocity: decreased   General Gait Details: limited to 6 slow labored unsteady steps at bedside with frequent buckling of knees due to weakness and limited due to c/o fatigue   Stairs             Wheelchair Mobility    Modified Rankin (Stroke Patients Only)       Balance Overall balance assessment: Needs assistance Sitting-balance support: Feet supported;No upper extremity supported Sitting balance-Leahy Scale: Good Sitting balance - Comments: seated EOB   Standing balance support: During functional activity;Bilateral upper extremity supported Standing balance-Leahy Scale: Poor Standing balance comment: fair/poor with RW                            Cognition Arousal/Alertness: Awake/alert Behavior During Therapy: WFL for tasks assessed/performed Overall Cognitive Status: Within Functional Limits for tasks assessed  Exercises General Exercises - Lower Extremity Long Arc Quad: Seated;AROM;Strengthening;Both;10 reps Hip Flexion/Marching: Seated;AROM;Strengthening;Both;10 reps Toe Raises: Seated;AROM;Strengthening;Both;10 reps Heel Raises: Seated;AROM;Strengthening;Both;10 reps    General Comments        Pertinent Vitals/Pain Pain Assessment: No/denies pain    Home Living                       Prior Function            PT Goals (current goals can now be found in the care plan section) Acute Rehab PT Goals Patient Stated Goal: return home with family to assist PT Goal Formulation: With patient Time For Goal Achievement: 07/16/20 Potential to Achieve Goals: Good Progress towards PT goals: Progressing toward goals    Frequency    Min 3X/week      PT Plan Current plan remains appropriate    Co-evaluation              AM-PAC PT "6 Clicks" Mobility   Outcome Measure  Help needed turning from your back to your side while in a flat bed without using bedrails?: A Little Help needed moving from lying on your back to sitting on the side of a flat bed without using bedrails?: A Lot Help needed moving to and from a bed to a chair (including a wheelchair)?: A Lot Help needed standing up from a chair using your arms (e.g., wheelchair or bedside chair)?: A Lot Help needed to walk in hospital room?: A Lot Help needed climbing 3-5 steps with a railing? : Total 6 Click Score: 12    End of Session Equipment Utilized During Treatment: Oxygen Activity Tolerance: Patient tolerated treatment well;Patient limited by fatigue Patient left: in chair;with call bell/phone within reach Nurse Communication: Mobility status PT Visit Diagnosis: Unsteadiness on feet (R26.81);Other abnormalities of gait and mobility (R26.89);Muscle weakness (generalized) (M62.81)     Time: 9528-4132 PT Time Calculation (min) (ACUTE ONLY): 24 min  Charges:  $Therapeutic Exercise: 8-22 mins $Therapeutic Activity: 8-22 mins                     12:27 PM, 07/06/20 Lonell Grandchild, MPT Physical Therapist with [facility] [phone] office [phone] mobile phone

## 2020-07-06 NOTE — NC FL2 (Signed)
[place] MEDICAID FL2 LEVEL OF CARE SCREENING TOOL     IDENTIFICATION  Patient Name: [name] [name] Birthdate: November 25, 1931 Sex: female Admission Date (Current Location): 06/29/2020  [facility] and [place] Number:  [employer] and Address:  [place] [address], [place]      Provider Number: [id number]  Attending Physician Name and Address:  Shawna Clamp, MD  Relative Name and Phone Number:  [name] (nephew) Ph:  [phone]    Current Level of Care: Hospital Recommended Level of Care: [place] Prior Approval Number:    Date Approved/Denied:   PASRR Number: [id number]  Discharge Plan: SNF    Current Diagnoses: Patient Active Problem List   Diagnosis Date Noted  . Goals of care, counseling/discussion   . Palliative care by specialist   . Idiopathic hypotension   . CHF exacerbation ([place]) 06/29/2020  . Arthritis of right knee 06/20/2020  . Leg swelling 06/16/2020  . Generalized osteoarthritis 05/09/2020  . Primary osteoarthritis, left shoulder 04/21/2020  . Acute systolic CHF (congestive heart failure) ([place]) 03/31/2020  . Bilateral lower extremity edema   . Acute on chronic congestive heart failure ([place]) 03/30/2020  . Right-sided epistaxis 03/28/2020  . Acute pain of left shoulder 01/15/2020  . AAA (abdominal aortic aneurysm) without rupture ([place]) 01/14/2020  . COVID-19 virus detected 11/23/2019  . Stage 3b chronic kidney disease ([place]) 08/04/2019  . Dyspnea 05/30/2019  . Anemia 05/30/2019  . Gout   . Glaucoma   . GERD (gastroesophageal reflux disease)   . Physical debility 04/28/2019  . Encounter for examination following treatment at hospital 11/03/2018  . Hyponatremia 09/27/2018  . H/O mitral valve replacement with mechanical valve 08/02/2018  . Chronic right hip pain 04/16/2018  . Chronic systolic congestive heart failure ([place]) 11/15/2017  . Trochanteric bursitis, right hip 03/07/2017  .  Persistent atrial fibrillation ([place])   . Nocturnal hypoxia 10/08/2016  . Dependence on nocturnal oxygen therapy 10/08/2016  . Hematuria 03/20/2016  . S/P ICD (internal cardiac defibrillator) procedure 02/08/2015  . Presence of cardiac pacemaker 02/08/2015  . CHB (complete heart block) ([place]) 10/07/2014  . Fatigue 07/20/2014  . Absolute anemia 06/24/2014  . Osteopenia 02/10/2014  . Encounter for therapeutic drug monitoring 10/29/2013  . OA (osteoarthritis) of knee 02/19/2013  . Chronic pain of right knee 01/02/2013  . Hemolytic anemia ([place]) 09/24/2012  . High risk medication use 05/24/2012  . COPD (chronic obstructive pulmonary disease) ([place]) 05/19/2012  . Chronic anticoagulation, on coumadin for Mechanical AVR and MVR 04/01/2012  . Cardiorenal syndrome with renal failure 03/22/2012  . Hx of aortic valve replacement, mechanical 03/22/2012  . S/P MVR (mitral valve replacement) 03/22/2012  . Biventricular automatic implantable cardioverter defibrillator in situ 03/22/2012  . CKD (chronic kidney disease), stage IV ([place]) 03/22/2012  . Warfarin-induced coagulopathy ([place]) 03/21/2012  . Iron deficiency 10/04/2011  . Allergic rhinitis 02/14/2011  . Hypothyroidism 07/15/2010  . Mitral valve disease 07/15/2010  . Sinus node dysfunction ([place]) 07/15/2010  . VARICOSE VEINS LOWER EXTREMITIES W/OTH COMPS 02/09/2009  . Prediabetes 06/15/2008  . Hypothyroidism, postsurgical 02/24/2008  . Hyperlipidemia LDL goal <70 02/24/2008  . Essential hypertension 02/24/2008  . Non-ischemic cardiomyopathy with ICD 02/24/2008  . Spondylosis 02/24/2008    Orientation RESPIRATION BLADDER Height & Weight     Self, Situation, Place  O2 (3-4L/min) Incontinent Weight: 194 lb 7.1 oz (88.2 kg) Height:  5\' 7"  (170.2 cm)  BEHAVIORAL SYMPTOMS/MOOD NEUROLOGICAL BOWEL NUTRITION STATUS  Continent Diet (Low sodium; fluid restriction (1826mL))  AMBULATORY STATUS COMMUNICATION OF NEEDS Skin   Extensive Assist  Verbally Other (Comment) (Ecchymosis: bilateral arms)                       Personal Care Assistance Level of Assistance  Bathing, Feeding, Dressing Bathing Assistance: Limited assistance Feeding assistance: Independent Dressing Assistance: Limited assistance     Functional Limitations Info  Sight, Hearing, Speech Sight Info: Impaired (Patient has reading glasses) Hearing Info: Adequate Speech Info: Adequate    SPECIAL CARE FACTORS FREQUENCY  PT (By licensed PT)     PT Frequency: 5x's/week              Contractures Contractures Info: Not present    Additional Factors Info  Code Status, Allergies, Psychotropic Code Status Info: Full Allergies Info: Penicillins; Aspirin; Fentanyl; Niacin Psychotropic Info: Neurontin (gabapentin)         Current Medications (07/06/2020):  This is the current hospital active medication list Current Facility-Administered Medications  Medication Dose Route Frequency Provider Last Rate Last Admin  . 0.9 %  sodium chloride infusion  250 mL Intravenous PRN Zierle-Ghosh, Asia B, DO      . acetaminophen (TYLENOL) tablet 650 mg  650 mg Oral Q4H PRN Zierle-Ghosh, Asia B, DO   650 mg at 07/05/20 0350  . albuterol (PROVENTIL) (2.5 MG/3ML) 0.083% nebulizer solution 2.5 mg  2.5 mg Nebulization Q4H PRN Zierle-Ghosh, Asia B, DO      . Chlorhexidine Gluconate Cloth 2 % PADS 6 each  6 each Topical Daily Shawna Clamp, MD   6 each at 07/06/20 0914  . enoxaparin (LOVENOX) injection 90 mg  90 mg Subcutaneous Q24H Shawna Clamp, MD   90 mg at 07/05/20 1727  . fluticasone (FLONASE) 50 MCG/ACT nasal spray 1 spray  1 spray Each Nare Daily Zierle-Ghosh, Asia B, DO   1 spray at 07/06/20 0914  . fluticasone furoate-vilanterol (BREO ELLIPTA) 100-25 MCG/INH 1 puff  1 puff Inhalation Daily Zierle-Ghosh, Asia B, DO   1 puff at 07/06/20 0749  . folic acid (FOLVITE) tablet 1 mg  1 mg Oral Daily Zierle-Ghosh, Asia B, DO   1 mg at 07/06/20 0914  . furosemide (LASIX)  tablet 40 mg  40 mg Oral Daily Satira Sark, MD   40 mg at 07/06/20 0915  . gabapentin (NEURONTIN) capsule 300 mg  300 mg Oral BID Bernerd Pho M, PA-C   300 mg at 07/06/20 4163  . levETIRAcetam (KEPPRA) tablet 500 mg  500 mg Oral BID Shawna Clamp, MD      . levothyroxine (SYNTHROID) tablet 75 mcg  75 mcg Oral QAC breakfast Zierle-Ghosh, Asia B, DO   75 mcg at 07/06/20 0913  . metoprolol succinate (TOPROL-XL) 24 hr tablet 100 mg  100 mg Oral Daily Zierle-Ghosh, Asia B, DO   100 mg at 07/06/20 0912  . ondansetron (ZOFRAN) injection 4 mg  4 mg Intravenous Q8H PRN Zierle-Ghosh, Asia B, DO   4 mg at 07/06/20 1424  . polyethylene glycol powder (GLYCOLAX/MIRALAX) container 17 g  17 g Oral Daily PRN Zierle-Ghosh, Asia B, DO      . pravastatin (PRAVACHOL) tablet 40 mg  40 mg Oral Daily Zierle-Ghosh, Asia B, DO   40 mg at 07/06/20 0913  . senna (SENOKOT) tablet 8.6 mg  1 tablet Oral BID PRN Shawna Clamp, MD      . sodium chloride 0.9 % bolus 500 mL  500 mL Intravenous  Once Shawna Clamp, MD      . sodium chloride flush (NS) 0.9 % injection 10-40 mL  10-40 mL Intracatheter Q12H Aviva Signs, MD   10 mL at 07/06/20 0916  . sodium chloride flush (NS) 0.9 % injection 10-40 mL  10-40 mL Intracatheter PRN Aviva Signs, MD      . sodium chloride flush (NS) 0.9 % injection 3 mL  3 mL Intravenous Q12H Zierle-Ghosh, Asia B, DO   3 mL at 07/05/20 2218  . sodium chloride flush (NS) 0.9 % injection 3 mL  3 mL Intravenous PRN Zierle-Ghosh, Asia B, DO      . warfarin (COUMADIN) tablet 10 mg  10 mg Oral ONCE-1600 Shawna Clamp, MD      . Warfarin - Pharmacist Dosing Inpatient   Does not apply q1600 Coffee, Donna Christen [facility]   Rx Charged at 07/02/20 1522   Facility-Administered Medications Ordered in Other Encounters  Medication Dose Route Frequency Provider Last Rate Last Admin  . epoetin alfa (EPOGEN,PROCRIT) injection 40,000 Units  40,000 Units Subcutaneous Once Farrel Gobble, MD         Discharge  Medications: Please see discharge summary for a list of discharge medications.  Relevant Imaging Results:  Relevant Lab Results:   Additional Information SSN: [id number]  Sherie Don, LCSW

## 2020-07-06 NOTE — Progress Notes (Signed)
PROGRESS NOTE    [name] [name]  ACZ:[id number] DOB: March 13, 1932 DOA: 06/29/2020 PCP: Fayrene Helper, MD   Brief Narrative:  This86 years old female with history of hypothyroidism, type 2 diabetes, nonischemic cardiomyopathy,  CHF ( EF 35-40%) Hyperlipidemia, GERD, CKD stage III, Aortic valve repair, CHB, s/p PPM, Persistent Atrial fibrillation , Chronic respiratory failure presented in the ED with worsening shortness of breath. Patient was admitted for acute on chronic systolic CHF exacerbation.  Cardiology was consulted recommended IV diuresis for few days. Patient was discharged but while waiting for transport, all of a sudden she had a seizure episode followed by unresponsiveness. Patient had a pulse but there was agonal breathing,  Ativan was given and was given rescue breathing with BVM.  She was DNR, Patient was placed on 100% non rebreather,  O2 sats 100%.  She was moved to ICU.  CT head negative for acute abnormality, EEG is pending.  Patient started on Keppra,seen bu neurology recommended to continue Keppra.  She continues to remain hypotensive despite fluid boluses.  She required Levophed briefly, blood pressure is improved.   Assessment & Plan:   Active Problems:   CHF exacerbation ([place])   Idiopathic hypotension   Goals of care, counseling/discussion   Palliative care by specialist   1. Hypotension requiring pressor support:   Patient remained hypotensive despite fluid boluses. Started on Levophed for pressor support briefly. BP improved. Levophed discontinued.   Central line needs to be removed before discharge.  2. Seizure episode:  Patient had a seizure episode while waiting for transport on 10/09. Patient was given Ativan, seizures stopped.  Continue Ativan as needed. CT head negative for acute abnormality, Patient was loaded with Keppra, will continue Keppra 500 mg twice daily.   EEG: Epileptogenic city arising from left temporal region as well as mild diffuse  encephalopathy, nonspecific etiology. Neurology consulted, recommended to continue [place] for now.  3 . Lactic acidosis:  >>> Resolved.      9.5 trended down 4.5 >> 0.9  This could be secondary to seizure episode. Lactic acid has decreased after fluid bolus.    4. Acute on chronic combined systolic and diastolic CHF:  Patient was admitted for CHF exacerbation which was managed adequately.  She was discharged and while waiting for transport she has a seizure, DC cancelled Recent Echo 01/2020 = EF 30-35%. Hold lasix, daily weights, Intake and output,  No ACE/ARB 2/2 renal insufficiency. Hold BP medications.  Cardiology re-consulted, Lasix resumed.  Metoprolol resumed.  2. Elevated troponin: Trop 28 - 31- 55-146.  In the setting of renal insufficiency. Likely demand ischemia. She denies any chest pain, palpitations, dizzziness. Discussed with cardiologist at [facility], this is troponin leak from demand ischemia. There is no other real ACS event.  3. Renal insufficiencyCKD stage IV Cr baseline is 1.4 - 1.5. Today 1.86 [place] Cardiorenal syndrome.  4. HTN: Hold BP meds.  5. Hypothyroidism: Continue synthroid.  6.:Persistent Atrial fibrillation: Heart rate remains controlled, denies any palpitations. Hold BP meds, continue Coumadin for anticoagulation. INR dropped, because she hasnt had coumadin one day due to seizure. INR 2.3 today . Pharmacy manages INR.  7. CHB, S/P PPM: Recently interrogated in August 2081 shows normal device function.  8. Valvular heart disease: S/p mechanical AVR in 2001 and mechanical MVR in 2004 Recent echo shows no significant valve abnormalities continue Coumadin.  9. COPD:not in exacerbation. Continue supplemental oxygen @ 3L/ m   DVT prophylaxis: Coumadin Code Status:DNR Family Communication: Spoke with Niece/son about  poor prognosis.- Disposition Plan:   Status is: Inpatient  Remains inpatient appropriate because:Inpatient  level of care appropriate due to severity of illness   Dispo: The patient is from: Home              Anticipated d/c is to: SNF              Anticipated d/c date is: 2 days              Patient currently is not medically stable to d/c.  Consultants:   Cardiology , Neurology.  Procedures:  Antimicrobials:  Anti-infectives (From admission, onward)   None     Subjective: Patient was seen and examined in the morning.  She was alert and oriented x 3.  She was on 3 L supplemental oxygen saturating 93%. She is off levophed, BP is stable. She wants to go home. She was having breakfast.  Objective: Vitals:   07/06/20 1050 07/06/20 1054 07/06/20 1118 07/06/20 1200  BP:    (!) 105/44  Pulse: 60 (!) 58  (!) 59  Resp: (!) 28 (!) 32  (!) 24  Temp:   97.7 F (36.5 C)   TempSrc:   Oral   SpO2: (!) 80% 100%  98%  Weight:      Height:        Intake/Output Summary (Last 24 hours) at 07/06/2020 1659 Last data filed at 07/06/2020 0916 Gross per 24 hour  Intake 10 ml  Output 850 ml  Net -840 ml   Filed Weights   07/03/20 1801 07/05/20 0500 07/06/20 0430  Weight: 86.2 kg 90.2 kg 88.2 kg    Examination:  General exam: Appears calm and comfortable. Alert, smiling  Respiratory system: Clear to auscultation. Respiratory effort normal. Cardiovascular system: S1 & S2 heard, RRR. No JVD, murmurs, rubs, gallops or clicks. No pedal edema. PPM noted. Gastrointestinal system: Abdomen is nondistended, soft and nontender. No organomegaly or masses felt. Normal bowel sounds heard. Central nervous system: Alert and oriented. No focal neurological deficits. Extremities: No edema, no cyanosis, no clubbing  Skin: No rashes, lesions or ulcers Psychiatry: Judgement and insight appear normal. Mood & affect appropriate.     Data Reviewed: I have personally reviewed following labs and imaging studies  CBC: Recent Labs  Lab 06/29/20 2002 06/29/20 2002 06/30/20 0500 07/01/20 0545 07/02/20 6144  07/02/20 3154 07/03/20 0086 07/03/20 1622 07/04/20 0625 07/05/20 0457 07/06/20 0353  WBC 7.8   < > 6.5   < > 5.5  --  4.9 9.0 12.3* 9.4  --   NEUTROABS 6.9  --  5.4  --   --   --   --   --   --   --   --   HGB 9.9*   < > 9.0*   < > 8.6*   < > 9.0* 9.7* 9.3* 8.1* 8.0*  HCT 32.0*   < > 29.6*   < > 28.4*   < > 29.4* 32.3* 29.1* 26.2* 25.4*  MCV 94.4   < > 95.5   < > 94.7  --  93.9 98.2 90.9 94.2  --   PLT 210   < > 197   < > 191  --  175 205 182 157  --    < > = values in this interval not displayed.   Basic Metabolic Panel: Recent Labs  Lab 07/02/20 0648 07/03/20 0621 07/03/20 1622 07/05/20 0457 07/06/20 0353  NA 138 138 138 138 138  K 4.2  3.8 4.0 3.6 3.7  CL 93* 94* 94* 97* 98  CO2 36* 35* 27 32 31  GLUCOSE 94 91 225* 90 86  BUN 60* 61* 55* 62* 65*  CREATININE 1.87* 1.69* 1.79* 1.86* 1.89*  CALCIUM 8.8* 8.7* 8.6* 8.4* 8.3*  MG 2.0 2.0 2.2 1.9 2.0  PHOS 4.2 3.4 5.2* 3.6 3.7   GFR: Estimated Creatinine Clearance: 24.4 mL/min (A) (by C-G formula based on SCr of 1.89 mg/dL (H)). Liver Function Tests: Recent Labs  Lab 06/29/20 2002 06/30/20 0500 07/01/20 0545 07/03/20 1622 07/05/20 0457  AST 34 23 22 43* 26  ALT 18 16 13 19 15   ALKPHOS 61 53 50 57 45  BILITOT 0.4 0.6 0.9 0.6 1.0  PROT 7.9 7.4 6.9 6.9 6.2*  ALBUMIN 3.8 3.3* 3.2* 3.0* 2.6*   No results for input(s): LIPASE, AMYLASE in the last 168 hours. No results for input(s): AMMONIA in the last 168 hours. Coagulation Profile: Recent Labs  Lab 07/02/20 0648 07/03/20 0621 07/04/20 0625 07/05/20 0457 07/06/20 0353  INR 2.4* 2.2* 1.8* 2.0* 2.3*   Cardiac Enzymes: No results for input(s): CKTOTAL, CKMB, CKMBINDEX, TROPONINI in the last 168 hours. BNP (last 3 results) No results for input(s): PROBNP in the last 8760 hours. HbA1C: No results for input(s): HGBA1C in the last 72 hours. CBG: Recent Labs  Lab 07/05/20 1954 07/06/20 0004 07/06/20 0352 07/06/20 0800 07/06/20 1123  GLUCAP 164* 108* 84 88  106*   Lipid Profile: No results for input(s): CHOL, HDL, LDLCALC, TRIG, CHOLHDL, LDLDIRECT in the last 72 hours. Thyroid Function Tests: No results for input(s): TSH, T4TOTAL, FREET4, T3FREE, THYROIDAB in the last 72 hours. Anemia Panel: No results for input(s): VITAMINB12, FOLATE, FERRITIN, TIBC, IRON, RETICCTPCT in the last 72 hours. Sepsis Labs: Recent Labs  Lab 07/03/20 1622 07/03/20 1759 07/05/20 1656 07/05/20 2116  LATICACIDVEN 9.7* 4.5* 0.7 0.9    Recent Results (from the past 240 hour(s))  Respiratory Panel by RT PCR (Flu A&B, Covid) - Nasopharyngeal Swab     Status: None   Collection Time: 06/29/20  7:57 PM   Specimen: Nasopharyngeal Swab  Result Value Ref Range Status   SARS Coronavirus 2 by RT PCR NEGATIVE NEGATIVE Final    Comment: (NOTE) SARS-CoV-2 target nucleic acids are NOT DETECTED.  The SARS-CoV-2 RNA is generally detectable in upper respiratoy specimens during the acute phase of infection. The lowest concentration of SARS-CoV-2 viral copies this assay can detect is 131 copies/mL. A negative result does not preclude SARS-Cov-2 infection and should not be used as the sole basis for treatment or other patient management decisions. A negative result may occur with  improper specimen collection/handling, submission of specimen other than nasopharyngeal swab, presence of viral mutation(s) within the areas targeted by this assay, and inadequate number of viral copies (<131 copies/mL). A negative result must be combined with clinical observations, patient history, and epidemiological information. The expected result is Negative.  Fact Sheet for Patients:  PinkCheek.be  Fact Sheet for Healthcare Providers:  GravelBags.it  This test is no t yet approved or cleared by the [place] FDA and  has been authorized for detection and/or diagnosis of SARS-CoV-2 by FDA under an Emergency Use Authorization  (EUA). This EUA will remain  in effect (meaning this test can be used) for the duration of the COVID-19 declaration under Section 564(b)(1) of the Act, 21 U.S.C. section 360bbb-3(b)(1), unless the authorization is terminated or revoked sooner.     Influenza A by PCR NEGATIVE NEGATIVE  Final   Influenza B by PCR NEGATIVE NEGATIVE Final    Comment: (NOTE) The Xpert Xpress SARS-CoV-2/FLU/RSV assay is intended as an aid in  the diagnosis of influenza from Nasopharyngeal swab specimens and  should not be used as a sole basis for treatment. Nasal washings and  aspirates are unacceptable for Xpert Xpress SARS-CoV-2/FLU/RSV  testing.  Fact Sheet for Patients: PinkCheek.be  Fact Sheet for Healthcare Providers: GravelBags.it  This test is not yet approved or cleared by the [place] FDA and  has been authorized for detection and/or diagnosis of SARS-CoV-2 by  FDA under an Emergency Use Authorization (EUA). This EUA will remain  in effect (meaning this test can be used) for the duration of the  Covid-19 declaration under Section 564(b)(1) of the Act, 21  U.S.C. section 360bbb-3(b)(1), unless the authorization is  terminated or revoked. Performed at [facility], [address], [place], [place] [address]      Radiology Studies: EEG  Result Date: 07/06/2020 Lora Havens, MD     07/06/2020 12:33 PM Patient Name: [name] [name] MRN: [id number] Epilepsy Attending: Lora Havens Referring Physician/Provider: Dr. Shanda Howells Date: 07/05/2020 Duration: 22.41 minutes Patient history: 86 year old female with new onset seizures.  EEG evaluate for seizures. Level of alertness: Awake AEDs during EEG study: Gabapentin, Keppra Technical aspects: This EEG study was done with scalp electrodes positioned according to the 10-20 International system of electrode placement. Electrical activity was acquired at a sampling rate of 500Hz  and  reviewed with a high frequency filter of 70Hz  and a low frequency filter of 1Hz . EEG data were recorded continuously and digitally stored. Description: No clear posterior dominant rhythm was seen.  EEG showed generalized polymorphic mixed frequencies with predominantly 6 to 9 Hz theta-alpha activity with intermittent 2 to 3 Hz generalized delta activity.  Sharp waves were seen in left temporal region. Hyperventilation and photic stimulation were not performed.   ABNORMALITY -Sharp waves, left temporal region - Continuous slow, generalized IMPRESSION: This study showed evidence of epileptogenic city arising from left temporal region as well as mild diffuse encephalopathy, nonspecific etiology.  No seizures were seen throughout the recording. Lora Havens   DG Chest Port 1 View  Result Date: 07/04/2020 CLINICAL DATA:  86 year old female with CHF. Central line placement. EXAM: PORTABLE CHEST 1 VIEW COMPARISON:  Chest radiograph dated 07/03/2020. FINDINGS: Interval placement of right subclavian central venous line with tip over central SVC close to the cavoatrial junction. Left lung base opacity most consistent with infiltrate. Clinical correlation and follow-up recommended. There is diffuse interstitial prominence which may be chronic or represent edema. No large pleural effusion or pneumothorax. Stable cardiomegaly. Left pectoral AICD device. Median sternotomy wires and mechanical cardiac valve. Atherosclerotic calcification of the aorta. Degenerative changes of the spine. No acute osseous pathology. IMPRESSION: 1. Interval placement of right subclavian central venous line with tip over central SVC. No pneumothorax. 2. Left lung base infiltrate. Electronically Signed   By: Anner Crete M.D.   On: 07/04/2020 18:27    Scheduled Meds:  Chlorhexidine Gluconate Cloth  6 each Topical Daily   enoxaparin (LOVENOX) injection  90 mg Subcutaneous Q24H   fluticasone  1 spray Each Nare Daily   fluticasone  furoate-vilanterol  1 puff Inhalation Daily   folic acid  1 mg Oral Daily   furosemide  40 mg Oral Daily   gabapentin  300 mg Oral BID   levETIRAcetam  500 mg Oral BID   levothyroxine  75 mcg Oral  QAC breakfast   metoprolol succinate  100 mg Oral Daily   pravastatin  40 mg Oral Daily   sodium chloride flush  10-40 mL Intracatheter Q12H   sodium chloride flush  3 mL Intravenous Q12H   warfarin  10 mg Oral ONCE-1600   Warfarin - Pharmacist Dosing Inpatient   Does not apply q1600   Continuous Infusions:  sodium chloride     sodium chloride       LOS: 6 days    Time spent: 35 mins    Kaida Games, MD [facility]   If 7PM-7AM, please contact night-coverage

## 2020-07-07 ENCOUNTER — Inpatient Hospital Stay (HOSPITAL_COMMUNITY): Payer: Medicare Other

## 2020-07-07 ENCOUNTER — Other Ambulatory Visit (HOSPITAL_COMMUNITY): Payer: Self-pay

## 2020-07-07 DIAGNOSIS — E11649 Type 2 diabetes mellitus with hypoglycemia without coma: Secondary | ICD-10-CM

## 2020-07-07 DIAGNOSIS — N184 Chronic kidney disease, stage 4 (severe): Secondary | ICD-10-CM

## 2020-07-07 DIAGNOSIS — R569 Unspecified convulsions: Secondary | ICD-10-CM

## 2020-07-07 DIAGNOSIS — D649 Anemia, unspecified: Secondary | ICD-10-CM

## 2020-07-07 DIAGNOSIS — I5042 Chronic combined systolic (congestive) and diastolic (congestive) heart failure: Secondary | ICD-10-CM | POA: Diagnosis not present

## 2020-07-07 DIAGNOSIS — J42 Unspecified chronic bronchitis: Secondary | ICD-10-CM

## 2020-07-07 DIAGNOSIS — I442 Atrioventricular block, complete: Secondary | ICD-10-CM

## 2020-07-07 DIAGNOSIS — I1310 Hypertensive heart and chronic kidney disease without heart failure, with stage 1 through stage 4 chronic kidney disease, or unspecified chronic kidney disease: Secondary | ICD-10-CM

## 2020-07-07 DIAGNOSIS — Z7901 Long term (current) use of anticoagulants: Secondary | ICD-10-CM

## 2020-07-07 DIAGNOSIS — E611 Iron deficiency: Secondary | ICD-10-CM

## 2020-07-07 LAB — GLUCOSE, CAPILLARY
Glucose-Capillary: 127 mg/dL — ABNORMAL HIGH (ref 70–99)
Glucose-Capillary: 84 mg/dL (ref 70–99)
Glucose-Capillary: 66 mg/dL — ABNORMAL LOW (ref 70–99)
Glucose-Capillary: 68 mg/dL — ABNORMAL LOW (ref 70–99)
Glucose-Capillary: 111 mg/dL — ABNORMAL HIGH (ref 70–99)
Glucose-Capillary: 103 mg/dL — ABNORMAL HIGH (ref 70–99)
Glucose-Capillary: 104 mg/dL — ABNORMAL HIGH (ref 70–99)
Glucose-Capillary: 103 mg/dL — ABNORMAL HIGH (ref 70–99)

## 2020-07-07 LAB — COMPREHENSIVE METABOLIC PANEL
ALT: 18 U/L (ref 0–44)
AST: 27 U/L (ref 15–41)
Albumin: 2.7 g/dL — ABNORMAL LOW (ref 3.5–5.0)
Alkaline Phosphatase: 48 U/L (ref 38–126)
Anion gap: 6 (ref 5–15)
BUN: 63 mg/dL — ABNORMAL HIGH (ref 8–23)
CO2: 34 mmol/L — ABNORMAL HIGH (ref 22–32)
Calcium: 8.6 mg/dL — ABNORMAL LOW (ref 8.9–10.3)
Chloride: 98 mmol/L (ref 98–111)
Creatinine, Ser: 1.77 mg/dL — ABNORMAL HIGH (ref 0.44–1.00)
GFR, Estimated: 26 mL/min — ABNORMAL LOW (ref >60)
Glucose, Bld: 90 mg/dL (ref 70–99)
Potassium: 4 mmol/L (ref 3.5–5.1)
Sodium: 138 mmol/L (ref 135–145)
Total Bilirubin: 0.6 mg/dL (ref 0.3–1.2)
Total Protein: 6.5 g/dL (ref 6.5–8.1)

## 2020-07-07 LAB — CBC
HCT: 28 % — ABNORMAL LOW (ref 36.0–46.0)
Hemoglobin: 8.5 g/dL — ABNORMAL LOW (ref 12.0–15.0)
MCH: 28.9 pg (ref 26.0–34.0)
MCHC: 30.4 g/dL (ref 30.0–36.0)
MCV: 95.2 fL (ref 80.0–100.0)
Platelets: 181 10*3/uL (ref 150–400)
RBC: 2.94 MIL/uL — ABNORMAL LOW (ref 3.87–5.11)
RDW: 16.7 % — ABNORMAL HIGH (ref 11.5–15.5)
WBC: 6.1 10*3/uL (ref 4.0–10.5)
nRBC: 0 % (ref 0.0–0.2)

## 2020-07-07 LAB — PHOSPHORUS: Phosphorus: 3.8 mg/dL (ref 2.5–4.6)

## 2020-07-07 LAB — PROTIME-INR
INR: 2.7 — ABNORMAL HIGH (ref 0.8–1.2)
Prothrombin Time: 27.8 seconds — ABNORMAL HIGH (ref 11.4–15.2)

## 2020-07-07 LAB — MAGNESIUM: Magnesium: 2.1 mg/dL (ref 1.7–2.4)

## 2020-07-07 MED ORDER — WARFARIN SODIUM 5 MG PO TABS: 10.0000 mg | ORAL_TABLET | Freq: Every day | ORAL | Status: DC

## 2020-07-07 MED ORDER — WARFARIN SODIUM 5 MG PO TABS
10.0000 mg | ORAL_TABLET | Freq: Once | ORAL | Status: AC
  Administered 2020-07-07: 10 mg via ORAL
  Filled 2020-07-07: qty 2

## 2020-07-07 NOTE — Progress Notes (Signed)
ANTICOAGULATION CONSULT NOTE -   Pharmacy Consult for warfarin dosing  Indication: atrial fibrillation               Temp: 98.7 F (37.1 C) (10/13 0431) Temp Source: Oral (10/13 0431) BP: 127/103 (10/13 0431) Pulse Rate: 67 (10/13 0431)  Labs: Recent Labs    07/05/20 0457 07/05/20 0457 07/06/20 0353 07/07/20 0656 07/07/20 0831  HGB 8.1*   < > 8.0* 8.5*  --   HCT 26.2*  --  25.4* 28.0*  --   PLT 157  --   --  181  --   LABPROT 21.9*  --  24.3*  --  27.8*  INR 2.0*  --  2.3*  --  2.7*  CREATININE 1.86*  --  1.89* 1.77*  --    < > = values in this interval not displayed.    Goal of Therapy:  INR 2.5-3.5 Monitor platelets by anticoagulation protocol: Yes   Home Dose: warfarin 10mg  DAILY except   5mg  onTuesdays.       Admit INR was 2.9 Lab Results  Component Value Date   INR 2.7 (H) 07/07/2020   INR 2.3 (H) 07/06/2020   INR 2.0 (H) 07/05/2020    Assessment: [name] [name] a 86 y.o. female requires anticoagulation with warfarin for the indication of  afib/aflutter with mechanical AVR and mechanical MVR. Warfarin will be initiated inpatient following pharmacy protocol per pharmacy consult. Patient most recent blood work is as follows: CBC Latest Ref Rng & Units 07/07/2020 07/06/2020 07/05/2020  WBC 4.0 - 10.5 K/uL 6.1 - 9.4  Hemoglobin 12.0 - 15.0 g/dL 8.5(L) 8.0(L) 8.1(L)  Hematocrit 36 - 46 % 28.0(L) 25.4(L) 26.2(L)  Platelets 150 - 400 K/uL 181 - 157   INR is 2.7, at goal. Therapeutic   Plan: Warfarin 10mg  po  today  D/C lovenox bridge Monitor CBC daily with am labs   Monitor INR daily Monitor for signs and symptoms of bleeding   Isac Sarna, BS Vena [place], BCPS Clinical Pharmacist Pager [phone] 07/07/2020 9:04 AM

## 2020-07-07 NOTE — Progress Notes (Signed)
Hypoglycemic Event  CBG: 66 (3335)  Treatment: 4oz orange juice  Symptoms: asymptomatic  Follow-up CBG: Time:0804 CBG Result:68  Treatment: 4oz orange juice, breakfast  Symptoms: asymptomatic  Follow-up CBG: Time: 0902 CBG Result: 111  Possible Reasons for Event: nothing to eat since 10/12  Comments/MD notified: Dr. Lonny Prude made aware    Marin Shutter

## 2020-07-07 NOTE — Progress Notes (Addendum)
Progress Note  Patient Name: [name] [name] Date of Encounter: 07/07/2020  Primary Cardiologist: Sanda Klein, MD   Subjective   Will awaken but quickly shuts her eyes again. Denies any current pain.   Inpatient Medications    Scheduled Meds: . Chlorhexidine Gluconate Cloth  6 each Topical Daily  . enoxaparin (LOVENOX) injection  90 mg Subcutaneous Q24H  . fluticasone  1 spray Each Nare Daily  . fluticasone furoate-vilanterol  1 puff Inhalation Daily  . folic acid  1 mg Oral Daily  . furosemide  40 mg Oral Daily  . gabapentin  300 mg Oral BID  . levETIRAcetam  500 mg Oral BID  . levothyroxine  75 mcg Oral QAC breakfast  . metoprolol succinate  100 mg Oral Daily  . pravastatin  40 mg Oral Daily  . sodium chloride flush  10-40 mL Intracatheter Q12H  . sodium chloride flush  3 mL Intravenous Q12H  . Warfarin - Pharmacist Dosing Inpatient   Does not apply q1600   Continuous Infusions: . sodium chloride    . sodium chloride     PRN Meds: sodium chloride, acetaminophen, albuterol, ondansetron (ZOFRAN) IV, polyethylene glycol powder, senna, sodium chloride flush, sodium chloride flush   Vital Signs    Vitals:   07/06/20 2100 07/06/20 2156 07/07/20 0103 07/07/20 0431  BP: (!) 113/59 (!) 148/68 (!) 135/59 (!) 127/103  Pulse: 65 (!) 59 (!) 58 67  Resp: (!) 22 20 20 20   Temp:  97.7 F (36.5 C) 98.6 F (37 C) 98.7 F (37.1 C)  TempSrc:  Oral Oral Oral  SpO2: 98% 98% 98% 97%  Weight:      Height:        Intake/Output Summary (Last 24 hours) at 07/07/2020 0829 Last data filed at 07/07/2020 0500 Gross per 24 hour  Intake 10 ml  Output 1000 ml  Net -990 ml    Last 3 Weights 07/06/2020 07/05/2020 07/03/2020  Weight (lbs) 194 lb 7.1 oz 198 lb 13.7 oz 190 lb 0.6 oz  Weight (kg) 88.2 kg 90.2 kg 86.2 kg      Telemetry    V-paced, HR in 60's.  - Personally Reviewed  ECG    No new tracings.   Physical Exam   General: Elderly female appearing in no acute  distress. Head: Normocephalic, atraumatic.  Neck: Supple without bruits, JVD not elevated. Lungs:  Resp regular and unlabored, decreased breath sounds along bases bilaterally. Heart: RRR, S1, S2, no S3, S4, 2/6 SEM along RUSB.  Abdomen: Soft, non-tender, non-distended with normoactive bowel sounds. No hepatomegaly. No rebound/guarding. No obvious abdominal masses. Extremities: No clubbing or cyanosis. Trace lower extremity edema. Distal pedal pulses are 2+ bilaterally. Neuro: Alert and oriented X 1 (person). Moves all extremities spontaneously. Psych: Awakens but goes back to sleep.   Labs    Chemistry Recent Labs  Lab 07/03/20 1622 07/03/20 1622 07/05/20 0457 07/06/20 0353 07/07/20 0656  NA 138   < > 138 138 138  K 4.0   < > 3.6 3.7 4.0  CL 94*   < > 97* 98 98  CO2 27   < > 32 31 34*  GLUCOSE 225*   < > 90 86 90  BUN 55*   < > 62* 65* 63*  CREATININE 1.79*   < > 1.86* 1.89* 1.77*  CALCIUM 8.6*   < > 8.4* 8.3* 8.6*  PROT 6.9  --  6.2*  --  6.5  ALBUMIN 3.0*  --  2.6*  --  2.7*  AST 43*  --  26  --  27  ALT 19  --  15  --  18  ALKPHOS 57  --  45  --  48  BILITOT 0.6  --  1.0  --  0.6  GFRNONAA 25*   < > 24* 24* 26*  ANIONGAP 17*   < > 9 9 6    < > = values in this interval not displayed.     Hematology Recent Labs  Lab 07/04/20 0625 07/04/20 0625 07/05/20 0457 07/06/20 0353 07/07/20 0656  WBC 12.3*  --  9.4  --  6.1  RBC 3.20*  --  2.78*  --  2.94*  HGB 9.3*   < > 8.1* 8.0* 8.5*  HCT 29.1*   < > 26.2* 25.4* 28.0*  MCV 90.9  --  94.2  --  95.2  MCH 29.1  --  29.1  --  28.9  MCHC 32.0  --  30.9  --  30.4  RDW 16.9*  --  17.2*  --  16.7*  PLT 182  --  157  --  181   < > = values in this interval not displayed.    Radiology    EEG  Result Date: 07/06/2020 Lora Havens, MD     07/06/2020 12:33 PM Patient Name: [name] [name] MRN: [id number] Epilepsy Attending: Lora Havens Referring Physician/Provider: Dr. Shanda Howells Date: 07/05/2020 Duration: 22.41  minutes Patient history: 86 year old female with new onset seizures.  EEG evaluate for seizures. Level of alertness: Awake AEDs during EEG study: Gabapentin, Keppra Technical aspects: This EEG study was done with scalp electrodes positioned according to the 10-20 International system of electrode placement. Electrical activity was acquired at a sampling rate of 500Hz  and reviewed with a high frequency filter of 70Hz  and a low frequency filter of 1Hz . EEG data were recorded continuously and digitally stored. Description: No clear posterior dominant rhythm was seen.  EEG showed generalized polymorphic mixed frequencies with predominantly 6 to 9 Hz theta-alpha activity with intermittent 2 to 3 Hz generalized delta activity.  Sharp waves were seen in left temporal region. Hyperventilation and photic stimulation were not performed.   ABNORMALITY -Sharp waves, left temporal region - Continuous slow, generalized IMPRESSION: This study showed evidence of epileptogenic city arising from left temporal region as well as mild diffuse encephalopathy, nonspecific etiology.  No seizures were seen throughout the recording. Lora Havens    Cardiac Studies   Echocardiogram: 01/2020 IMPRESSIONS    1. Left ventricular ejection fraction, by estimation, is 30 to 35%. The  left ventricle has moderately decreased function. The left ventricle  demonstrates global hypokinesis. There is mild concentric left ventricular  hypertrophy. Left ventricular  diastolic function could not be evaluated.  2. Right ventricular systolic function is normal. The right ventricular  size is normal. There is severely elevated pulmonary artery systolic  pressure. The estimated right ventricular systolic pressure is 66.4 mmHg.  3. Left atrial size was severely dilated.  4. Right atrial size was moderately dilated.  5. The mitral valve has been repaired/replaced. No evidence of mitral  valve regurgitation.  6. The aortic valve has  been repaired/replaced. Aortic valve  regurgitation is not visualized.   Patient Profile     86 y.o. female w/PMH of CHB (s/p Medtr[employer] BiV PPMin 01/2019), persistent atrial fibrillation/flutter, chronic combined systolic and diastolic CHF (EF previously as low as 20-25%, normalized by repeat echo in 2019 and 02/2019, EF 30-35% by echocardiogram in  01/2020), mechanical AVR in 2001 and mechanical MVR in 2004, HTN, HLD, Type 2 DM, COPDand Stage 3 CKD who was initially admitted for a CHF exacerbation. Was to be discharged last week but developed seizures with associated hypotension at that time requiring pressor support.  Assessment & Plan    1. Acute on Chronic Combined Systolic and Diastolic CHF - She has a known reducedEFof30-35% by echocardiogram in 01/2020 (from study review by Dr. Sallyanne Kuster and Dr. Harl Bowie has looked to be closer to 50%). - Initially diuresed well with IV Lasix and she has been transitioned to PO Lasix 40mg  daily. I&O's not recorded and weights have been variable but trending down as this was recorded at 198 lbs on 10/11, at 194 lbs yesterday. -ContinueToprol-XL 100mg  daily. Previously on Hydralazine and Imdur which are now held given variable BP.She is not on ACE-I/ARB/ARNI due to variable renal function.   2. CHB - She iss/p Medtronic BiV PPMin 01/2019. Followed by Dr. Sallyanne Kuster.   3. Persistent Atrial Fibrillation/Flutter - Rates have been stable in the 60's. Remains on Toprol-XL 100mg  daily for rate-control and Coumadin for anticoagulation.   4. Valvular Heart Disease - She is s/p mechanical AVR in 2001 and mechanical MVR in 2004.No significant valve abnormalities by echo in 01/2020. On Coumadin for anticoagulation. INR at 2.7 today.   5. HTN - BP has been variable from 99/44 - 148/103 within the past 24 hours. She has been restarted on Toprol-XL. PTA Hydralazine and Imdur currently held.   6. Stage 3 CKD - Creatinine stable at 1.77 today.    7.  Seizure Episode - Occurred prior to anticipated discharge. EEG showed evidence of epileptogenic city arising from left temporal region as well as mild diffuse encephalopathy. Neurology following. She has been started on Keppra.  For questions or updates, please contact [place] Please consult [email or web address] for contact info under Cardiology/STEMI.   Arna Medici , PA-C 8:29 AM 07/07/2020 Pager: [phone]   Attending note:  Patient seen and examined, I reviewed hospital course since rounds yesterday and discussed the case with Delano Metz, I agree with her above findings.  Patient not very interactive today, no specific complaints.  Blood pressure still fluctuating somewhat, recently 135/59 and later 127/103.  Ventricular pacing noted by telemetry.  Lungs exhibit no wheezing.  Cardiac exam with 2/6 systolic murmur.  Pertinent lab work includes potassium 4.0, BUN 63, creatinine 1.77, hemoglobin 8.5, INR 2.7.  Plan to continue Coumadin with mechanical AVR and MVR in place, INR is currently therapeutic.  Lasix was cut back to 40 mg daily, continue same for now.  Also on Toprol-XL 100 mg daily as well as Pravachol.  As blood pressure further stabilizes we will plan to potentially resume low-dose hydralazine and Imdur.  Satira Sark, M.D., F.A.C.C.

## 2020-07-07 NOTE — Progress Notes (Signed)
[facility]  PROGRESS NOTE  [name] [name] ZOX:[id number] DOB: June 08, 1932 DOA: 06/29/2020 PCP: Fayrene Helper, MD Admit date - 06/29/2020   Admitting Physician Shawna Clamp, MD  Outpatient Primary MD for the patient is Fayrene Helper, MD  LOS - 7 Brief Narrative   [name] [name] is a 86 y.o. year old female with Medical history significant for mechanical AVR/MVR on Coumadin, HTN, stage III CKD, CHB s/p pacemaker, chronic combined systolic/diastolic CHF who initially presented on 10/5 with worsening shortness of breath and found to have acute on chronic systolic CHF exacerbation.  She responded well to IV diuresis and was a plan discharge but while waiting for transport still he had a seizure episode with resultant agonal breathing requiring rescue breathing with bag ventilation mask.  Hospital course was further complicated by requiring ICU stay and brief course of Levophed for hypotension as well as initiation of IV Keppra as recommended by neurology.  EEG showed epileptogenic activity from left temporal region as well as mild diffuse encephalopathy.  Patient able to undergo MRI due to [employer] device  Hypotension requiring pressor support, resolved.  Briefly on Levophed during ICU stay.   Subjective  Today has not ate much because her dentures were lost during the seizure episode several days ago  A & P    New  onsetSeizure episode on 10/9.  CT head unremarkable.  Unable to undergo MRI due to cardiac device that is not MRI compatible.  Epileptiform discharges on EEG next patient at high risk for recurrent seizures -Neurology recommends continuing IV Keppra 500 mg twice daily -Has not had any recurrent seizures since 10/9  Hypoglycemia.  Blood glucose in the 60s this morning.  Patient reports difficulty eating without her dentures.  She believes they were lost after her seizure episode. -continue carb/Fluid restricted diet, change from regular to soft diet --not on any  insulin  Lactic acidosis in the setting of seizure episode, now resolved.  Peak of 9.5-trended to 0.9 with fluid  Acute on chronic combined systolic/diastolic CHF.  Diuresed via IV Lasix during hospitalization.  Currently euvolemic.   --Appreciate cardiology recommendations, continue reduced dose of Lasix 40 mg daily, continue Toprol-XL 100 mg -.If blood pressure remains stable, can resume low-dose hydralazine and Imdur at low-dose  CKD stage IV, stable at baseline. baseline 1.4-1.7, currently 1.77.  Has normal output -Avoid nephrotoxins -Monitor output, monitor BMP  Hypothyroidism, stable -Continue Synthroid  CHB, status post PPM, stable  Mechanical AVR/MVR, stable.  Repeat TTE shows no significant valve abnormalities -Continue Coumadin, currently therapeutic on INR  COPD, stable.  No wheezing.  HTN.  No hypotensive episodes.  Course complicated by hypotension requiring Levophed in the setting of seizure. -Tolerating Toprol -May be able to resume low-dose hydralazine and Imdur per cardiology, monitoring blood pressure     Family Communication  : None  Code Status : DNR  Disposition Plan  :  Patient is from home. Anticipated d/c date:  24 hours. Barriers to d/c or necessity for inpatient status:  Close monitoring of blood glucose, anticipate discharge in 24 hours if otherwise remains clinically stable Consults  : Cardiology, neurology  Procedures  : EEG  DVT Prophylaxis  :  Warfarin  MDM: The below labs and imaging reports were reviewed and summarized above.  Medication management as above.  Lab Results  Component Value Date   PLT 181 07/07/2020    Diet :  Diet Order            DIET  SOFT Room service appropriate? Yes; Fluid consistency: Thin; Fluid restriction: 1800 mL Fluid  Diet effective now           Diet - low sodium heart healthy           Diet - low sodium heart healthy           Diet Carb Modified                  Inpatient  Medications Scheduled Meds: . Chlorhexidine Gluconate Cloth  6 each Topical Daily  . fluticasone  1 spray Each Nare Daily  . fluticasone furoate-vilanterol  1 puff Inhalation Daily  . folic acid  1 mg Oral Daily  . furosemide  40 mg Oral Daily  . gabapentin  300 mg Oral BID  . levETIRAcetam  500 mg Oral BID  . levothyroxine  75 mcg Oral QAC breakfast  . metoprolol succinate  100 mg Oral Daily  . pravastatin  40 mg Oral Daily  . sodium chloride flush  10-40 mL Intracatheter Q12H  . sodium chloride flush  3 mL Intravenous Q12H  . Warfarin - Pharmacist Dosing Inpatient   Does not apply q1600   Continuous Infusions: . sodium chloride    . sodium chloride     PRN Meds:.sodium chloride, acetaminophen, albuterol, ondansetron (ZOFRAN) IV, polyethylene glycol powder, senna, sodium chloride flush, sodium chloride flush  Antibiotics  :   Anti-infectives (From admission, onward)   None       Objective   Vitals:   07/06/20 2156 07/07/20 0103 07/07/20 0431 07/07/20 1445  BP: (!) 148/68 (!) 135/59 (!) 127/103 130/68  Pulse: (!) 59 (!) 58 67 61  Resp: 20 20 20    Temp: 97.7 F (36.5 C) 98.6 F (37 C) 98.7 F (37.1 C) 98.1 F (36.7 C)  TempSrc: Oral Oral Oral Oral  SpO2: 98% 98% 97% 100%  Weight:      Height:        SpO2: 100 % O2 Flow Rate (L/min): 3 L/min  Wt Readings from Last 3 Encounters:  07/06/20 88.2 kg  06/29/20 93 kg  06/16/20 93 kg     Intake/Output Summary (Last 24 hours) at 07/07/2020 1808 Last data filed at 07/07/2020 1326 Gross per 24 hour  Intake 480 ml  Output 150 ml  Net 330 ml    Physical Exam:     Awake Alert, Oriented X 3, Normal affect No new F.N deficits,  [place].AT, Left eye proptosis Normal respiratory effort on 3 L, CTAB RRR,No Gallops,Rubs or new Murmurs,  +ve B.Sounds, Abd Soft, No tenderness, No rebound, guarding or rigidity. No peripheral edema   I have personally reviewed the following:   Data Reviewed:  CBC Recent Labs   Lab 07/03/20 0621 07/03/20 0621 07/03/20 1622 07/04/20 0625 07/05/20 0457 07/06/20 0353 07/07/20 0656  WBC 4.9  --  9.0 12.3* 9.4  --  6.1  HGB 9.0*   < > 9.7* 9.3* 8.1* 8.0* 8.5*  HCT 29.4*   < > 32.3* 29.1* 26.2* 25.4* 28.0*  PLT 175  --  205 182 157  --  181  MCV 93.9  --  98.2 90.9 94.2  --  95.2  MCH 28.8  --  29.5 29.1 29.1  --  28.9  MCHC 30.6  --  30.0 32.0 30.9  --  30.4  RDW 17.0*  --  17.0* 16.9* 17.2*  --  16.7*   < > = values in this interval not displayed.  Chemistries  Recent Labs  Lab 07/01/20 0545 07/02/20 2992 07/03/20 0621 07/03/20 1622 07/05/20 0457 07/06/20 0353 07/07/20 0656  NA 138   < > 138 138 138 138 138  K 4.0   < > 3.8 4.0 3.6 3.7 4.0  CL 93*   < > 94* 94* 97* 98 98  CO2 34*   < > 35* 27 32 31 34*  GLUCOSE 81   < > 91 225* 90 86 90  BUN 55*   < > 61* 55* 62* 65* 63*  CREATININE 1.45*   < > 1.69* 1.79* 1.86* 1.89* 1.77*  CALCIUM 9.3   < > 8.7* 8.6* 8.4* 8.3* 8.6*  MG 2.0   < > 2.0 2.2 1.9 2.0 2.1  AST 22  --   --  43* 26  --  27  ALT 13  --   --  19 15  --  18  ALKPHOS 50  --   --  57 45  --  48  BILITOT 0.9  --   --  0.6 1.0  --  0.6   < > = values in this interval not displayed.   ------------------------------------------------------------------------------------------------------------------ No results for input(s): CHOL, HDL, LDLCALC, TRIG, CHOLHDL, LDLDIRECT in the last 72 hours.  Lab Results  Component Value Date   HGBA1C 5.8 (A) 03/23/2020   HGBA1C 5.8 03/23/2020   HGBA1C 5.8 03/23/2020   HGBA1C 5.8 03/23/2020   ------------------------------------------------------------------------------------------------------------------ No results for input(s): TSH, T4TOTAL, T3FREE, THYROIDAB in the last 72 hours.  Invalid input(s): FREET3 ------------------------------------------------------------------------------------------------------------------ No results for input(s): VITAMINB12, FOLATE, FERRITIN, TIBC, IRON, RETICCTPCT in  the last 72 hours.  Coagulation profile Recent Labs  Lab 07/03/20 0621 07/04/20 0625 07/05/20 0457 07/06/20 0353 07/07/20 0831  INR 2.2* 1.8* 2.0* 2.3* 2.7*    No results for input(s): DDIMER in the last 72 hours.  Cardiac Enzymes No results for input(s): CKMB, TROPONINI, MYOGLOBIN in the last 168 hours.  Invalid input(s): CK ------------------------------------------------------------------------------------------------------------------    Component Value Date/Time   BNP 569.0 (H) 06/29/2020 2002   BNP 159.0 (H) 06/12/2013 1725    Micro Results Recent Results (from the past 240 hour(s))  Respiratory Panel by RT PCR (Flu A&B, Covid) - Nasopharyngeal Swab     Status: None   Collection Time: 06/29/20  7:57 PM   Specimen: Nasopharyngeal Swab  Result Value Ref Range Status   SARS Coronavirus 2 by RT PCR NEGATIVE NEGATIVE Final    Comment: (NOTE) SARS-CoV-2 target nucleic acids are NOT DETECTED.  The SARS-CoV-2 RNA is generally detectable in upper respiratoy specimens during the acute phase of infection. The lowest concentration of SARS-CoV-2 viral copies this assay can detect is 131 copies/mL. A negative result does not preclude SARS-Cov-2 infection and should not be used as the sole basis for treatment or other patient management decisions. A negative result may occur with  improper specimen collection/handling, submission of specimen other than nasopharyngeal swab, presence of viral mutation(s) within the areas targeted by this assay, and inadequate number of viral copies (<131 copies/mL). A negative result must be combined with clinical observations, patient history, and epidemiological information. The expected result is Negative.  Fact Sheet for Patients:  PinkCheek.be  Fact Sheet for Healthcare Providers:  GravelBags.it  This test is no t yet approved or cleared by the [place] FDA and  has been  authorized for detection and/or diagnosis of SARS-CoV-2 by FDA under an Emergency Use Authorization (EUA). This EUA will remain  in effect (meaning this  test can be used) for the duration of the COVID-19 declaration under Section 564(b)(1) of the Act, 21 U.S.C. section 360bbb-3(b)(1), unless the authorization is terminated or revoked sooner.     Influenza A by PCR NEGATIVE NEGATIVE Final   Influenza B by PCR NEGATIVE NEGATIVE Final    Comment: (NOTE) The Xpert Xpress SARS-CoV-2/FLU/RSV assay is intended as an aid in  the diagnosis of influenza from Nasopharyngeal swab specimens and  should not be used as a sole basis for treatment. Nasal washings and  aspirates are unacceptable for Xpert Xpress SARS-CoV-2/FLU/RSV  testing.  Fact Sheet for Patients: PinkCheek.be  Fact Sheet for Healthcare Providers: GravelBags.it  This test is not yet approved or cleared by the [place] FDA and  has been authorized for detection and/or diagnosis of SARS-CoV-2 by  FDA under an Emergency Use Authorization (EUA). This EUA will remain  in effect (meaning this test can be used) for the duration of the  Covid-19 declaration under Section 564(b)(1) of the Act, 21  U.S.C. section 360bbb-3(b)(1), unless the authorization is  terminated or revoked. Performed at [facility], [address], [place], [place] [address]     Radiology Reports EEG  Result Date: 07/06/2020 Lora Havens, MD     07/06/2020 12:33 PM Patient Name: [name] [name] MRN: [id number] Epilepsy Attending: Lora Havens Referring Physician/Provider: Dr. Shanda Howells Date: 07/05/2020 Duration: 22.41 minutes Patient history: 87 year old female with new onset seizures.  EEG evaluate for seizures. Level of alertness: Awake AEDs during EEG study: Gabapentin, Keppra Technical aspects: This EEG study was done with scalp electrodes positioned according to the 10-20 International  system of electrode placement. Electrical activity was acquired at a sampling rate of 500Hz  and reviewed with a high frequency filter of 70Hz  and a low frequency filter of 1Hz . EEG data were recorded continuously and digitally stored. Description: No clear posterior dominant rhythm was seen.  EEG showed generalized polymorphic mixed frequencies with predominantly 6 to 9 Hz theta-alpha activity with intermittent 2 to 3 Hz generalized delta activity.  Sharp waves were seen in left temporal region. Hyperventilation and photic stimulation were not performed.   ABNORMALITY -Sharp waves, left temporal region - Continuous slow, generalized IMPRESSION: This study showed evidence of epileptogenic city arising from left temporal region as well as mild diffuse encephalopathy, nonspecific etiology.  No seizures were seen throughout the recording. Lora Havens   DG Chest 2 View  Result Date: 06/26/2020 CLINICAL DATA:  86 year old female with a history of stage IIIB chronic kidney disease EXAM: CHEST - 2 VIEW COMPARISON:  05/31/2020 FINDINGS: Cardiomediastinal silhouette unchanged in size and contour. Surgical changes of median sternotomy, mitral valve annuloplasty, aortic valve repair. Unchanged appearance of left chest wall cardiac pacing device/AICD. Coarsened interstitial markings bilateral lungs, similar to the comparison. Interstitial opacities. No confluent airspace disease. No large pleural effusion, no pneumothorax. Degenerative changes of the spine. Osteopenia. Configuration of the thoracic vertebral bodies unchanged from the comparison, with mild wedge deformity of midthoracic vertebral body. IMPRESSION: Early pulmonary edema versus chronic changes. Similar appearance of cardiomegaly, left chest wall AICD, surgical changes of median sternotomy and CABG with mitral annuloplasty and aortic valve repair. Electronically Signed   By: Corrie Mckusick D.O.   On: 06/26/2020 15:51   CT HEAD WO CONTRAST  Result Date:  07/03/2020 CLINICAL DATA:  Seizure.  Unresponsive. EXAM: CT HEAD WITHOUT CONTRAST TECHNIQUE: Contiguous axial images were obtained from the base of the skull through the vertex without intravenous contrast. COMPARISON:  CT head 03/27/2017. FINDINGS: Despite efforts by the technologist and patient, mild motion artifact is present on today's exam and could not be eliminated. This reduces exam sensitivity and specificity. Brain: There is no evidence of acute intracranial hemorrhage, mass lesion, brain edema or extra-axial fluid collection. Mildly progressive atrophy with prominence of the ventricles and subarachnoid spaces. There is mild low-density in the periventricular white matter, likely due to chronic small vessel ischemic changes. There is no CT evidence of acute cortical infarction. Vascular: Intracranial vascular calcifications. No hyperdense vessel identified. Skull: Negative for fracture or focal lesion. Sinuses/Orbits: Opacified left division of the sphenoid sinus. The additional paranasal sinuses, mastoid air cells and middle ears are clear. No acute orbital findings. Probable previous lens surgery bilaterally. Other: None. IMPRESSION: 1. No acute intracranial findings. 2. Mildly progressive atrophy and chronic small vessel ischemic changes. 3. Left sphenoid sinus disease. Electronically Signed   By: Richardean Sale M.D.   On: 07/03/2020 17:20   DG Chest Port 1 View  Result Date: 07/04/2020 CLINICAL DATA:  85 year old female with CHF. Central line placement. EXAM: PORTABLE CHEST 1 VIEW COMPARISON:  Chest radiograph dated 07/03/2020. FINDINGS: Interval placement of right subclavian central venous line with tip over central SVC close to the cavoatrial junction. Left lung base opacity most consistent with infiltrate. Clinical correlation and follow-up recommended. There is diffuse interstitial prominence which may be chronic or represent edema. No large pleural effusion or pneumothorax. Stable  cardiomegaly. Left pectoral AICD device. Median sternotomy wires and mechanical cardiac valve. Atherosclerotic calcification of the aorta. Degenerative changes of the spine. No acute osseous pathology. IMPRESSION: 1. Interval placement of right subclavian central venous line with tip over central SVC. No pneumothorax. 2. Left lung base infiltrate. Electronically Signed   By: Anner Crete M.D.   On: 07/04/2020 18:27   DG Chest Port 1 View  Result Date: 07/03/2020 CLINICAL DATA:  86 year old female with seizure. EXAM: PORTABLE CHEST 1 VIEW COMPARISON:  Chest radiograph dated 06/30/2020. FINDINGS: Evaluation is limited due to patient's rotation. There is stable cardiomegaly with vascular congestion. Diffuse interstitial and peribronchial densities may represent edema although atypical infection is not excluded. Clinical correlation is recommended. No large pleural effusion or pneumothorax. Median sternotomy wires and left pectoral AICD device. Atherosclerotic calcification of the aorta. No acute osseous pathology. IMPRESSION: Stable cardiomegaly with vascular congestion. Pneumonia is not excluded. Electronically Signed   By: Anner Crete M.D.   On: 07/03/2020 17:34   DG Chest Port 1 View  Result Date: 06/30/2020 CLINICAL DATA:  Congestive heart failure exacerbation. EXAM: PORTABLE CHEST 1 VIEW COMPARISON:  06/29/2020 FINDINGS: Heart is enlarged. Valve replacements are noted. Pacing for related wires are stable. Atherosclerotic calcifications are noted at the aorta. Diffuse interstitial prominence is stable mostly chronic. Effusions are present. No airspace consolidation is present. Degenerative changes are noted at the left shoulder. IMPRESSION: 1. Cardiomegaly without failure. 2. Stable chronic interstitial prominence compatible with edema. 3. Small bilateral pleural effusions are stable. Electronically Signed   By: San Morelle M.D.   On: 06/30/2020 06:11   DG Chest Port 1 View  Result  Date: 06/29/2020 CLINICAL DATA:  Shortness of breath.  Pulmonary edema. EXAM: PORTABLE CHEST 1 VIEW COMPARISON:  Radiograph 06/25/2020, additional priors. CT 05/27/2020. FINDINGS: Stable cardiomegaly. Unchanged mediastinal contours. Aortic atherosclerosis. Pacemaker in place with prosthetic cardiac valves. Similar appearance of interstitial coarsening from prior exam. No progression. No focal airspace disease. No pleural fluid. No pneumothorax. Stable osseous structures. IMPRESSION: 1. Stable cardiomegaly. 2.  Unchanged interstitial coarsening, this is similar to multiple prior exams. Septal thickening on prior CT suggests an element of pulmonary edema. There is background emphysema on CT. 3. No new or acute airspace disease Electronically Signed   By: Keith Rake M.D.   On: 06/29/2020 20:31     Time Spent in minutes  30     Desiree Hane M.D on 07/07/2020 at 6:08 PM  To page go to [email or web address] - password [facility]

## 2020-07-07 NOTE — TOC Progression Note (Signed)
Transition of Care [facility]) - Progression Note   Patient Details  Name: Malavika Lira MRN: [id number] Date of Birth: 1932-02-07  Transition of Care [facility] CM/SW [place], LCSW Phone Number: 07/07/2020, 3:39 PM  Clinical Narrative: Family is agreeable to bed offer for [facility]. CSW spoke with [facility] with [facility]. Bed will be available tomorrow. COVID test to be ordered.. TOC to follow.  Expected Discharge Plan: [place] Barriers to Discharge: Barriers Resolved  Expected Discharge Plan and Services Expected Discharge Plan: [place] In-house Referral: Clinical Social Work Discharge Planning Services: NA Post Acute Care Choice: [place] arrangements for the past 2 months: Single Family Home Expected Discharge Date: 07/03/20               HH Arranged: PT, OT HH Agency: [place] ([facility]) Date [place]: 07/10/20 Time [place]: 1408 Representative spoke with at [place]: [place]  Readmission Risk Interventions Readmission Risk Prevention Plan 04/01/2020 04/01/2020 02/03/2020  Transportation Screening Complete Complete Complete  PCP or Specialist Appt within 3-5 Days - - -  HRI or [place] for [place] - - -  Medication Review (RN Care Manager) Complete - Referral to Pharmacy  PCP or Specialist appointment within 3-5 days of discharge - - Complete  HRI or Home Care Consult Complete Complete Complete  SW Recovery Care/Counseling Consult Complete - Complete  Palliative Care Screening Not Applicable Not Applicable Not [place] Not Applicable Not Applicable Not Applicable  Some recent data might be hidden

## 2020-07-08 ENCOUNTER — Ambulatory Visit (HOSPITAL_COMMUNITY): Payer: Medicare Other | Admitting: Hematology

## 2020-07-08 ENCOUNTER — Other Ambulatory Visit: Payer: Self-pay

## 2020-07-08 ENCOUNTER — Ambulatory Visit (HOSPITAL_COMMUNITY): Payer: Medicare Other

## 2020-07-08 ENCOUNTER — Inpatient Hospital Stay (HOSPITAL_COMMUNITY): Payer: Medicare Other

## 2020-07-08 ENCOUNTER — Emergency Department (HOSPITAL_COMMUNITY): Payer: Medicare Other

## 2020-07-08 ENCOUNTER — Encounter (HOSPITAL_COMMUNITY): Payer: Self-pay | Admitting: Emergency Medicine

## 2020-07-08 ENCOUNTER — Emergency Department (HOSPITAL_COMMUNITY)
Admission: EM | Admit: 2020-07-08 | Discharge: 2020-07-09 | Disposition: A | Discharge status: Skilled Nursing Facility | Payer: Medicare Other | Attending: Emergency Medicine | Admitting: Emergency Medicine

## 2020-07-08 ENCOUNTER — Other Ambulatory Visit (HOSPITAL_COMMUNITY): Payer: Medicare Other

## 2020-07-08 DIAGNOSIS — D649 Anemia, unspecified: Secondary | ICD-10-CM

## 2020-07-08 DIAGNOSIS — Z79899 Other long term (current) drug therapy: Secondary | ICD-10-CM | POA: Insufficient documentation

## 2020-07-08 DIAGNOSIS — N289 Disorder of kidney and ureter, unspecified: Secondary | ICD-10-CM

## 2020-07-08 DIAGNOSIS — I5043 Acute on chronic combined systolic (congestive) and diastolic (congestive) heart failure: Secondary | ICD-10-CM | POA: Diagnosis not present

## 2020-07-08 DIAGNOSIS — N184 Chronic kidney disease, stage 4 (severe): Secondary | ICD-10-CM | POA: Insufficient documentation

## 2020-07-08 DIAGNOSIS — I130 Hypertensive heart and chronic kidney disease with heart failure and stage 1 through stage 4 chronic kidney disease, or unspecified chronic kidney disease: Secondary | ICD-10-CM | POA: Diagnosis not present

## 2020-07-08 DIAGNOSIS — R0602 Shortness of breath: Secondary | ICD-10-CM

## 2020-07-08 DIAGNOSIS — J9621 Acute and chronic respiratory failure with hypoxia: Secondary | ICD-10-CM

## 2020-07-08 DIAGNOSIS — I110 Hypertensive heart disease with heart failure: Secondary | ICD-10-CM | POA: Diagnosis not present

## 2020-07-08 DIAGNOSIS — E039 Hypothyroidism, unspecified: Secondary | ICD-10-CM | POA: Insufficient documentation

## 2020-07-08 DIAGNOSIS — Z85850 Personal history of malignant neoplasm of thyroid: Secondary | ICD-10-CM | POA: Insufficient documentation

## 2020-07-08 DIAGNOSIS — Z8616 Personal history of COVID-19: Secondary | ICD-10-CM | POA: Insufficient documentation

## 2020-07-08 DIAGNOSIS — E1165 Type 2 diabetes mellitus with hyperglycemia: Secondary | ICD-10-CM | POA: Insufficient documentation

## 2020-07-08 DIAGNOSIS — Z20822 Contact with and (suspected) exposure to covid-19: Secondary | ICD-10-CM | POA: Diagnosis not present

## 2020-07-08 DIAGNOSIS — J449 Chronic obstructive pulmonary disease, unspecified: Secondary | ICD-10-CM | POA: Insufficient documentation

## 2020-07-08 DIAGNOSIS — J441 Chronic obstructive pulmonary disease with (acute) exacerbation: Secondary | ICD-10-CM

## 2020-07-08 DIAGNOSIS — I509 Heart failure, unspecified: Secondary | ICD-10-CM | POA: Diagnosis not present

## 2020-07-08 DIAGNOSIS — R748 Abnormal levels of other serum enzymes: Secondary | ICD-10-CM | POA: Insufficient documentation

## 2020-07-08 DIAGNOSIS — I517 Cardiomegaly: Secondary | ICD-10-CM | POA: Diagnosis not present

## 2020-07-08 DIAGNOSIS — Z87891 Personal history of nicotine dependence: Secondary | ICD-10-CM | POA: Insufficient documentation

## 2020-07-08 DIAGNOSIS — R778 Other specified abnormalities of plasma proteins: Secondary | ICD-10-CM

## 2020-07-08 DIAGNOSIS — Z7901 Long term (current) use of anticoagulants: Secondary | ICD-10-CM

## 2020-07-08 DIAGNOSIS — I5042 Chronic combined systolic (congestive) and diastolic (congestive) heart failure: Secondary | ICD-10-CM | POA: Diagnosis not present

## 2020-07-08 LAB — CBC WITH DIFFERENTIAL/PLATELET
Abs Immature Granulocytes: 0.06 10*3/uL (ref 0.00–0.07)
Basophils Absolute: 0 10*3/uL (ref 0.0–0.1)
Basophils Relative: 0 %
Eosinophils Absolute: 0 10*3/uL (ref 0.0–0.5)
Eosinophils Relative: 0 %
HCT: 30.7 % — ABNORMAL LOW (ref 36.0–46.0)
Hemoglobin: 9.2 g/dL — ABNORMAL LOW (ref 12.0–15.0)
Immature Granulocytes: 1 %
Lymphocytes Relative: 8 %
Lymphs Abs: 0.6 10*3/uL — ABNORMAL LOW (ref 0.7–4.0)
MCH: 28.7 pg (ref 26.0–34.0)
MCHC: 30 g/dL (ref 30.0–36.0)
MCV: 95.6 fL (ref 80.0–100.0)
Monocytes Absolute: 0.6 10*3/uL (ref 0.1–1.0)
Monocytes Relative: 8 %
Neutro Abs: 6 10*3/uL (ref 1.7–7.7)
Neutrophils Relative %: 83 %
Platelets: 190 10*3/uL (ref 150–400)
RBC: 3.21 MIL/uL — ABNORMAL LOW (ref 3.87–5.11)
RDW: 16.5 % — ABNORMAL HIGH (ref 11.5–15.5)
WBC: 7.2 10*3/uL (ref 4.0–10.5)
nRBC: 0 % (ref 0.0–0.2)

## 2020-07-08 LAB — GLUCOSE, CAPILLARY
Glucose-Capillary: 105 mg/dL — ABNORMAL HIGH (ref 70–99)
Glucose-Capillary: 83 mg/dL (ref 70–99)
Glucose-Capillary: 90 mg/dL (ref 70–99)
Glucose-Capillary: 91 mg/dL (ref 70–99)
Glucose-Capillary: 95 mg/dL (ref 70–99)

## 2020-07-08 LAB — PROTIME-INR
INR: 2.9 — ABNORMAL HIGH (ref 0.8–1.2)
INR: 3.2 — ABNORMAL HIGH (ref 0.8–1.2)
Prothrombin Time: 29.2 seconds — ABNORMAL HIGH (ref 11.4–15.2)
Prothrombin Time: 31.5 s — ABNORMAL HIGH (ref 11.4–15.2)

## 2020-07-08 LAB — RESPIRATORY PANEL BY RT PCR (FLU A&B, COVID)
Influenza A by PCR: NEGATIVE
Influenza B by PCR: NEGATIVE
SARS Coronavirus 2 by RT PCR: NEGATIVE

## 2020-07-08 LAB — SARS CORONAVIRUS 2 BY RT PCR (HOSPITAL ORDER, PERFORMED IN ~~LOC~~ HOSPITAL LAB): SARS Coronavirus 2: NEGATIVE

## 2020-07-08 MED ORDER — POLYETHYLENE GLYCOL 3350 17 G PO PACK: 17.0000 g | PACK | Freq: Every day | ORAL | 0 refills | Status: AC | PRN

## 2020-07-08 MED ORDER — METOPROLOL SUCCINATE ER 100 MG PO TB24: 100.0000 mg | ORAL_TABLET | Freq: Every day | ORAL | Status: DC

## 2020-07-08 MED ORDER — FLUTICASONE PROPIONATE 50 MCG/ACT NA SUSP: 2.0000 | Freq: Every day | NASAL | 1 refills | Status: DC

## 2020-07-08 MED ORDER — POLYETHYLENE GLYCOL 3350 17 G PO PACK
17.0000 g | PACK | Freq: Two times a day (BID) | ORAL | Status: DC
  Administered 2020-07-08: 17 g via ORAL
  Filled 2020-07-08: qty 1

## 2020-07-08 MED ORDER — VITAMIN D 25 MCG (1000 UNIT) PO TABS: 1000.0000 [IU] | ORAL_TABLET | Freq: Every morning | ORAL | Status: AC

## 2020-07-08 MED ORDER — FUROSEMIDE 40 MG PO TABS
40.0000 mg | ORAL_TABLET | Freq: Every day | ORAL | Status: DC
Start: 2020-07-09 — End: 2020-07-12

## 2020-07-08 MED ORDER — FOLIC ACID 1 MG PO TABS
ORAL_TABLET | ORAL | 5 refills | Status: DC
Start: 2020-07-08 — End: 2020-08-11

## 2020-07-08 MED ORDER — SYNTHROID 75 MCG PO TABS
75.0000 ug | ORAL_TABLET | Freq: Every day | ORAL | Status: DC
Start: 2020-07-08 — End: 2020-08-11

## 2020-07-08 MED ORDER — SENNA 8.6 MG PO TABS: 2.0000 | ORAL_TABLET | Freq: Every day | ORAL | 0 refills | Status: DC | PRN

## 2020-07-08 MED ORDER — PRAVASTATIN SODIUM 40 MG PO TABS: 40.0000 mg | ORAL_TABLET | Freq: Every day | ORAL | Status: DC

## 2020-07-08 MED ORDER — ALBUTEROL SULFATE (2.5 MG/3ML) 0.083% IN NEBU: 2.5000 mg | INHALATION_SOLUTION | Freq: Two times a day (BID) | RESPIRATORY_TRACT | 12 refills | Status: DC

## 2020-07-08 MED ORDER — SYSTANE 0.4-0.3 % OP GEL: 1.0000 "application " | Freq: Every day | OPHTHALMIC | 0 refills | Status: DC

## 2020-07-08 MED ORDER — LEVETIRACETAM 500 MG PO TABS: 500.0000 mg | ORAL_TABLET | Freq: Two times a day (BID) | ORAL | Status: DC

## 2020-07-08 MED ORDER — ALBUTEROL SULFATE HFA 108 (90 BASE) MCG/ACT IN AERS: 1.0000 | INHALATION_SPRAY | Freq: Four times a day (QID) | RESPIRATORY_TRACT | Status: DC | PRN

## 2020-07-08 MED ORDER — GABAPENTIN 300 MG PO CAPS: ORAL_CAPSULE | ORAL | 3 refills | Status: DC

## 2020-07-08 MED ORDER — MECLIZINE HCL 12.5 MG PO TABS: ORAL_TABLET | ORAL | 2 refills | Status: DC

## 2020-07-08 MED ORDER — ACETAMINOPHEN 500 MG PO TABS
500.0000 mg | ORAL_TABLET | Freq: Four times a day (QID) | ORAL | 0 refills | Status: DC | PRN
Start: 2020-07-08 — End: 2021-01-31

## 2020-07-08 MED ORDER — ONDANSETRON 4 MG PO TBDP: 4.0000 mg | ORAL_TABLET | Freq: Three times a day (TID) | ORAL | 0 refills | Status: DC | PRN

## 2020-07-08 MED ORDER — SENNA 8.6 MG PO TABS
2.0000 | ORAL_TABLET | Freq: Every day | ORAL | Status: DC
  Administered 2020-07-08: 17.2 mg via ORAL
  Filled 2020-07-08: qty 2

## 2020-07-08 MED ORDER — WARFARIN SODIUM 5 MG PO TABS: 5.0000 mg | ORAL_TABLET | ORAL | Status: DC

## 2020-07-08 MED ORDER — BREO ELLIPTA 100-25 MCG/INH IN AEPB: INHALATION_SPRAY | RESPIRATORY_TRACT | 4 refills | Status: DC

## 2020-07-08 MED ORDER — NITROGLYCERIN 0.4 MG SL SUBL: 0.4000 mg | SUBLINGUAL_TABLET | SUBLINGUAL | 0 refills | Status: DC | PRN

## 2020-07-08 NOTE — TOC Transition Note (Signed)
Transition of Care [facility]) - CM/SW Discharge Note   Patient Details  Name: [name] [name] MRN: [id number] Date of Birth: 1931/12/25  Transition of Care [facility]) CM/SW Contact:  Natasha Bence, LCSW Phone Number: 07/08/2020, 2:48 PM   Clinical Narrative:    CSW inquired about patient's readiness for discharge. MD confirmed patient is ready for discharge. CSW contacted [facility] to verify Bed availability. [place] agreeable to take patient. Nurse to call report. TOC signing off.   Final next level of care: Skilled Nursing Facility Barriers to Discharge: Barriers Resolved   Patient Goals and CMS Choice Patient states their goals for this hospitalization and ongoing recovery are:: Rehab with SNF CMS Medicare.gov Compare Post Acute Care list provided to:: Patient Choice offered to / list presented to : Patient, Adult Children  Discharge Placement                Patient to be transferred to facility by: [facility] Name of family member notified: Cecille Rubin Patient and family notified of of transfer: 07/08/20  Discharge Plan and Services In-house Referral: Clinical Social Work Discharge Planning Services: NA Post Acute Care Choice: Home Health                    HH Arranged: PT, OT [facility] Agency: [place] ([facility]) Date HH Agency Contacted: 07/10/20 Time [place]: 1408 Representative spoke with at [place]: [place] ([place]) Interventions     Readmission Risk Interventions Readmission Risk Prevention Plan 07/08/2020 04/01/2020 04/01/2020  Transportation Screening Complete Complete Complete  PCP or Specialist Appt within 3-5 Days - - -  HRI or [place] for [place] - - -  Medication Review Press photographer) Complete Complete -  PCP or Specialist appointment within 3-5 days of discharge Complete - -  HRI or [place] Complete Complete Complete  SW Recovery Care/Counseling Consult Complete Complete -  Palliative Care Screening Not Applicable Not Applicable Not Applicable  Skilled Nursing Facility Complete Not Applicable Not Applicable  Some recent data might be hidden

## 2020-07-08 NOTE — Progress Notes (Signed)
Physical Therapy Treatment Patient Details Name: Melah Ebling MRN: [id number] DOB: 01-Nov-1931 Today's Date: 07/08/2020    History of Present Illness [name] [name]  is a 86 y.o. female, with history of hypothyroidism, T2DM, Non-ischemic cardiomyopathy, HLD, GERD, CKD, CHF, aortic valve repair, chronic respiratory failure, presents to the ED with dyspnea. Patient reports that it started one week ago, and has been progressively worse since. She has doubled her own lasix dose at home, and reports increased urine output, but the dyspnea has continued. She has associated peripheral edema and 15 pound weight gain. She also reports orthopnea. Patient has no chest pain, no palpitations, fevers, or weakness. On ROS she does report a cough that is occasionally productive of sputum - it has been ongoing. She also reports constipation. No other concerns at this time.    PT Comments    Patient completes bed mobility with slow, labored movements and requires mod assist to transition to seated EOB with HOB elevated secondary to weakness. Patient completes exercises seated EOB showing good sitting tolerance and sitting balance. She is able to transition from seated to standing x4 with mod/max assist with RW secondary to severe LE weakness. Patient shows impaired standing tolerance and is limited to less than 1 minute with all standing trials. Patient able to take several labored, lateral steps at bedside with cueing. Patient assisted back into bed at end of session with family present. Patient will benefit from continued physical therapy in hospital and recommended venue below to increase strength, balance, endurance for safe ADLs and gait.   Follow Up Recommendations  SNF     Equipment Recommendations  None recommended by PT    Recommendations for Other Services       Precautions / Restrictions Precautions Precautions: Fall Restrictions Weight Bearing Restrictions: No    Mobility  Bed  Mobility Overal bed mobility: Needs Assistance Bed Mobility: Supine to Sit;Sit to Supine     Supine to sit: Mod assist Sit to supine: Mod assist   General bed mobility comments: slow, labored transiton to seated EOB, requires assist to pull to seated and some verbal cueing for sequencing  Transfers Overall transfer level: Needs assistance Equipment used: Rolling walker (2 wheeled)   Sit to Stand: Mod assist;Max assist         General transfer comment: transfer to standing x4 with use of RW  Ambulation/Gait Ambulation/Gait assistance: Mod assist Gait Distance (Feet): 2 Feet Assistive device: Rolling walker (2 wheeled)   Gait velocity: decreased   General Gait Details: limited to several small, lateral steps at bedside   Stairs             Wheelchair Mobility    Modified Rankin (Stroke Patients Only)       Balance Overall balance assessment: Needs assistance Sitting-balance support: Feet supported;No upper extremity supported Sitting balance-Leahy Scale: Good Sitting balance - Comments: seated EOB   Standing balance support: During functional activity;Bilateral upper extremity supported Standing balance-Leahy Scale: Poor Standing balance comment: fair/poor with RW                            Cognition Arousal/Alertness: Awake/alert Behavior During Therapy: WFL for tasks assessed/performed Overall Cognitive Status: Within Functional Limits for tasks assessed  Exercises General Exercises - Lower Extremity Long Arc Quad: Seated;AROM;Strengthening;Both;10 reps Hip Flexion/Marching: Seated;AROM;Strengthening;Both;10 reps Toe Raises: Seated;AROM;Strengthening;Both;10 reps Heel Raises: Seated;AROM;Strengthening;Both;10 reps    General Comments        Pertinent Vitals/Pain Pain Assessment: No/denies pain    Home Living                      Prior Function            PT  Goals (current goals can now be found in the care plan section) Acute Rehab PT Goals Patient Stated Goal: return home with family to assist PT Goal Formulation: With patient Time For Goal Achievement: 07/16/20 Potential to Achieve Goals: Good Progress towards PT goals: Progressing toward goals    Frequency    Min 3X/week      PT Plan Current plan remains appropriate    Co-evaluation              AM-PAC PT "6 Clicks" Mobility   Outcome Measure  Help needed turning from your back to your side while in a flat bed without using bedrails?: A Little Help needed moving from lying on your back to sitting on the side of a flat bed without using bedrails?: A Lot Help needed moving to and from a bed to a chair (including a wheelchair)?: A Lot Help needed standing up from a chair using your arms (e.g., wheelchair or bedside chair)?: A Lot Help needed to walk in hospital room?: A Lot Help needed climbing 3-5 steps with a railing? : Total 6 Click Score: 12    End of Session Equipment Utilized During Treatment: Oxygen;Gait belt Activity Tolerance: Patient tolerated treatment well;Patient limited by fatigue Patient left: with call bell/phone within reach;in bed;with bed alarm set;with family/visitor present Nurse Communication: Mobility status PT Visit Diagnosis: Unsteadiness on feet (R26.81);Other abnormalities of gait and mobility (R26.89);Muscle weakness (generalized) (M62.81)     Time: 2423-5361 PT Time Calculation (min) (ACUTE ONLY): 24 min  Charges:  $Therapeutic Exercise: 8-22 mins $Therapeutic Activity: 8-22 mins                     2:20 PM, 07/08/20 Mearl Latin PT, DPT Physical Therapist at [facility]

## 2020-07-08 NOTE — ED Triage Notes (Addendum)
Patient brought over from [facility] for shortness of breath. Patient oxygen saturations on 3 liters of oxygen were 80 percent. Staff gave patient a breathing treatment at the facility and stated patient's oxygen saturations were in the 70's on 3 liters. Patient denies any pain. Patient placed on 6 liters of oxygen and sats are now 100 percent in the ED.

## 2020-07-08 NOTE — Progress Notes (Signed)
Progress Note  Patient Name: [name] [name] Date of Encounter: 07/08/2020  Primary Cardiologist: Sanda Klein, MD  Subjective   Patient somewhat more interactive this morning.  No chest pain or breathlessness at rest.  Inpatient Medications    Scheduled Meds:  Chlorhexidine Gluconate Cloth  6 each Topical Daily   fluticasone  1 spray Each Nare Daily   fluticasone furoate-vilanterol  1 puff Inhalation Daily   folic acid  1 mg Oral Daily   furosemide  40 mg Oral Daily   gabapentin  300 mg Oral BID   levETIRAcetam  500 mg Oral BID   levothyroxine  75 mcg Oral QAC breakfast   metoprolol succinate  100 mg Oral Daily   polyethylene glycol  17 g Oral BID   pravastatin  40 mg Oral Daily   senna  2 tablet Oral Daily   sodium chloride flush  10-40 mL Intracatheter Q12H   sodium chloride flush  3 mL Intravenous Q12H   Warfarin - Pharmacist Dosing Inpatient   Does not apply q1600   Continuous Infusions:  sodium chloride     sodium chloride     PRN Meds: sodium chloride, acetaminophen, albuterol, ondansetron (ZOFRAN) IV, sodium chloride flush, sodium chloride flush   Vital Signs    Vitals:   07/07/20 2032 07/08/20 0446 07/08/20 0653 07/08/20 0819  BP: 92/71 (!) 151/65    Pulse: 64 63    Resp: 20 16    Temp: 98 F (36.7 C) 97.9 F (36.6 C)    TempSrc:      SpO2: 92% 100%  93%  Weight:   90.7 kg   Height:        Intake/Output Summary (Last 24 hours) at 07/08/2020 1112 Last data filed at 07/08/2020 0300 Gross per 24 hour  Intake 600 ml  Output 1000 ml  Net -400 ml   Filed Weights   07/05/20 0500 07/06/20 0430 07/08/20 0653  Weight: 90.2 kg 88.2 kg 90.7 kg    Telemetry    Ventricular paced rhythm, underlying atrial fibrillation.  Burst of NSVT noted.  Personally reviewed.  ECG    No new tracings for review today.  Physical Exam   GEN:  Elderly woman, no acute distress.   Neck: No JVD. Cardiac: RRR, 2/6 systolic murmur.  Respiratory:  Nonlabored. Clear to auscultation bilaterally. GI: Soft, nontender, bowel sounds present. MS: No pitting edema.  Labs    Chemistry Recent Labs  Lab 07/03/20 1622 07/03/20 1622 07/05/20 0457 07/06/20 0353 07/07/20 0656  NA 138   < > 138 138 138  K 4.0   < > 3.6 3.7 4.0  CL 94*   < > 97* 98 98  CO2 27   < > 32 31 34*  GLUCOSE 225*   < > 90 86 90  BUN 55*   < > 62* 65* 63*  CREATININE 1.79*   < > 1.86* 1.89* 1.77*  CALCIUM 8.6*   < > 8.4* 8.3* 8.6*  PROT 6.9  --  6.2*  --  6.5  ALBUMIN 3.0*  --  2.6*  --  2.7*  AST 43*  --  26  --  27  ALT 19  --  15  --  18  ALKPHOS 57  --  45  --  48  BILITOT 0.6  --  1.0  --  0.6  GFRNONAA 25*   < > 24* 24* 26*  ANIONGAP 17*   < > 9 9 6    < > =  values in this interval not displayed.     Hematology Recent Labs  Lab 07/04/20 0625 07/04/20 0625 07/05/20 0457 07/06/20 0353 07/07/20 0656  WBC 12.3*  --  9.4  --  6.1  RBC 3.20*  --  2.78*  --  2.94*  HGB 9.3*   < > 8.1* 8.0* 8.5*  HCT 29.1*   < > 26.2* 25.4* 28.0*  MCV 90.9  --  94.2  --  95.2  MCH 29.1  --  29.1  --  28.9  MCHC 32.0  --  30.9  --  30.4  RDW 16.9*  --  17.2*  --  16.7*  PLT 182  --  157  --  181   < > = values in this interval not displayed.    Cardiac Enzymes Recent Labs  Lab 06/29/20 2002 06/30/20 0500 07/03/20 1622 07/03/20 1759  TROPONINIHS 28* 30* 55* 146*    Radiology    No results found.  Cardiac Studies   Echocardiogram 01/31/2020: 1. Left ventricular ejection fraction, by estimation, is 30 to 35%. The  left ventricle has moderately decreased function. The left ventricle  demonstrates global hypokinesis. There is mild concentric left ventricular  hypertrophy. Left ventricular  diastolic function could not be evaluated.  2. Right ventricular systolic function is normal. The right ventricular  size is normal. There is severely elevated pulmonary artery systolic  pressure. The estimated right ventricular systolic pressure is 40.1 mmHg.  3.  Left atrial size was severely dilated.  4. Right atrial size was moderately dilated.  5. The mitral valve has been repaired/replaced. No evidence of mitral  valve regurgitation.  6. The aortic valve has been repaired/replaced. Aortic valve  regurgitation is not visualized.   Patient Profile     86 y.o. female with a history of CHB (s/p Medtronic BiV PPMin 01/2019), persistent atrial fibrillation/flutter, chronic combined systolic and diastolic CHF (EF previously as low as 20-25%, normalized by repeat echo in 2019 and 02/2019, EF 30-35% by echocardiogram in 01/2020), mechanical AVR in 2001 and mechanical MVR in 2004, HTN, HLD, Type 2 DM, COPDand Stage 3 CKD.  Assessment & Plan    1.  Chronic combined heart failure status post recent exacerbation and improvement with IV diuresis.  LVEF 30 to 35% by most recent assessment, although subsequent review finds this closer to the 50% range.  She is on oral medical therapy at this time including Toprol-XL, Pravachol, and Lasix.  Hydralazine and nitrates discontinued with fluctuating blood pressures.  2.  Persistent atrial fibrillation, CHA2DS2-VASc score is 6.  Continues on Coumadin per pharmacy with INR 2.9.  3.  Valvular heart disease status post mechanical AVR and MVR.  Continues on Coumadin per pharmacy with INR 2.9.  4.  Complete heart block with Medtronic biventricular in place.  Ventricular pacing evident by telemetry with underlying atrial fibrillation.  5.  CKD stage IIIb-IV.  Creatinine 1.77.  6.  Recent seizure with subsequent hypotension, transient use of pressors and fluid boluses, neurology work-up underway.  She is currently on Keppra.  ECG indicates elective genic focus in the left temporal region as well as mild diffuse encephalopathy.  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 Sallyanne Kuster), although does have MRI compatible pacer.  Continue current doses of oral Lasix, Toprol-XL, and  Pravachol.  Signed, Rozann Lesches, MD  07/08/2020, 11:12 AM

## 2020-07-08 NOTE — ED Provider Notes (Signed)
[facility] EMERGENCY DEPARTMENT Provider Note   CSN: [id number] Arrival date & time: 07/08/20  2211   History Chief Complaint  Patient presents with  . Shortness of Breath    [name] [name] is a 86 y.o. female.  The history is provided by the patient and the nursing home.  Shortness of Breath She has history of diabetes, hyperlipidemia, combined systolic and diastolic heart failure, status post mitral valve replacement with chronic anticoagulation with warfarin and was sent here from the skilled nursing facility where she lives because of hypoxia.  She is maintained on 3 L of nasal oxygen, but oxygen saturation dropped into the 70s in spite of being given a breathing treatment.  She was increased to 6 L nasal oxygen with improvement in oxygen saturation.  Patient states that she feels fine now although she does admit to having had some dyspnea earlier tonight.  She denies any chest pain, heaviness, tightness, pressure.  She denies any cough.  She denies fever chills.  Past Medical History:  Diagnosis Date  . Acute on chronic diastolic CHF (congestive heart failure) ([place]) 05/19/2012  . Acute on chronic respiratory failure with hypoxia ([place]) 01/31/2020  . Adenomatous polyp of colon 12/16/2002   Dr. [name]/[facility]  . Allergy   . Calculus of gallbladder with chronic cholecystitis without obstruction 02/09/2009   Qualifier: Diagnosis of  By: [name], [name]    . Cataract   . CHB (complete heart block) ([place]) 10/07/2014      . CHF (congestive heart failure) ([place])    a.  EF previously reduced to 25-30% b. EF improved to 60-65% by echo in 10/2017.   Marland Kitchen Chronic kidney disease, stage I 2006  . Constipation       . COPD (chronic obstructive pulmonary disease) ([place])       . DJD (degenerative joint disease) of lumbar spine   . DM (diabetes mellitus) ([place]) 2006  . Esophageal reflux   . Glaucoma   . Gout       . H/O adenomatous polyp of colon 05/24/2012     2004 in [place] colonoscopy 2008 normal    . H/O heart valve replacement with mechanical valve    a. s/p mechanical AVR in 2001 and mechanical MVR in 2004.  Marland Kitchen H/O wisdom tooth extraction   . HIP PAIN, RIGHT 04/23/2009   Qualifier: Diagnosis of  By: [name], [name]    . Hyperlipemia   . IDA (iron deficiency anemia)    Parenteral iron/[name]  . Insomnia       . Nausea without vomiting 06/07/2010   Qualifier: Diagnosis of  By: [name], [name]    . Non-ischemic cardiomyopathy ([place])    a. s/p MDT CRTD  . Obesity, unspecified   . Other and unspecified hyperlipidemia   . Oxygen deficiency 2014   nocturnal;  . Papilloma of breast 03/06/2012   This was diagnosed as a left breast papilloma by ultrasound characteristics and symptoms in 2010. Because of possible medical risk factors no surgical intervention has been done and it did doing annual followups. The area has been stable by physical examination and mammograms and ultrasound since 2010.   Marland Kitchen Spinal stenosis of lumbar region 02/19/2013  . Spondylolisthesis   . Spondylosis   . Thyroid cancer ([place])    remote thyroidectomy, no recurrence, pt denies in 2016 thyroid cancer  . Type 2 diabetes mellitus with hyperglycemia ([place]) 07/15/2010  . Unspecified hypothyroidism       .  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 Vertigo         Patient Active Problem List   Diagnosis Date Noted  . Seizure ([place]) 07/07/2020  . Goals of care, counseling/discussion   . Palliative care by specialist   . Idiopathic hypotension   . CHF exacerbation ([place]) 06/29/2020  . Arthritis of right knee 06/20/2020  . Leg swelling 06/16/2020  . Generalized osteoarthritis 05/09/2020  . Primary osteoarthritis, left shoulder 04/21/2020  . Acute systolic CHF (congestive heart failure) ([place]) 03/31/2020  . Bilateral lower extremity edema   . Acute on chronic congestive heart failure ([place]) 03/30/2020  . Right-sided epistaxis  03/28/2020  . Acute pain of left shoulder 01/15/2020  . AAA (abdominal aortic aneurysm) without rupture ([place]) 01/14/2020  . COVID-19 virus detected 11/23/2019  . Stage 3b chronic kidney disease ([place]) 08/04/2019  . Dyspnea 05/30/2019  . Anemia 05/30/2019  . Gout   . Glaucoma   . GERD (gastroesophageal reflux disease)   . Physical debility 04/28/2019  . Encounter for examination following treatment at hospital 11/03/2018  . Hyponatremia 09/27/2018  . H/O mitral valve replacement with mechanical valve 08/02/2018  . Chronic right hip pain 04/16/2018  . Chronic systolic congestive heart failure ([place]) 11/15/2017  . Trochanteric bursitis, right hip 03/07/2017  . Persistent atrial fibrillation ([place])   . Nocturnal hypoxia 10/08/2016  . Dependence on nocturnal oxygen therapy 10/08/2016  . Hematuria 03/20/2016  . S/P ICD (internal cardiac defibrillator) procedure 02/08/2015  . Presence of cardiac pacemaker 02/08/2015  . CHB (complete heart block) ([place]) 10/07/2014  . Fatigue 07/20/2014  . Absolute anemia 06/24/2014  . Osteopenia 02/10/2014  . Encounter for therapeutic drug monitoring 10/29/2013  . OA (osteoarthritis) of knee 02/19/2013  . Chronic pain of right knee 01/02/2013  . Hemolytic anemia ([place]) 09/24/2012  . High risk medication use 05/24/2012  . COPD (chronic obstructive pulmonary disease) ([place]) 05/19/2012  . Chronic anticoagulation, on coumadin for Mechanical AVR and MVR 04/01/2012  . Cardiorenal syndrome with renal failure 03/22/2012  . Hx of aortic valve replacement, mechanical 03/22/2012  . S/P MVR (mitral valve replacement) 03/22/2012  . Biventricular automatic implantable cardioverter defibrillator in situ 03/22/2012  . CKD (chronic kidney disease), stage IV ([place]) 03/22/2012  . Warfarin-induced coagulopathy ([place]) 03/21/2012  . Iron deficiency 10/04/2011  . Allergic rhinitis 02/14/2011  . Hypothyroidism 07/15/2010  . Mitral valve disease 07/15/2010  . Sinus node  dysfunction ([place]) 07/15/2010  . Type 2 diabetes mellitus with hypoglycemia without coma ([place]) 07/15/2010  . VARICOSE VEINS LOWER EXTREMITIES W/OTH COMPS 02/09/2009  . Prediabetes 06/15/2008  . Hypothyroidism, postsurgical 02/24/2008  . Hyperlipidemia LDL goal <70 02/24/2008  . Essential hypertension 02/24/2008  . Non-ischemic cardiomyopathy with ICD 02/24/2008  . Spondylosis 02/24/2008    Past Surgical History:  Procedure Laterality Date  . AORTIC AND MITRAL VALVE REPLACEMENT  2001  . BI-VENTRICULAR IMPLANTABLE CARDIOVERTER DEFIBRILLATOR UPGRADE N/A 01/18/2015   Procedure: BI-VENTRICULAR IMPLANTABLE CARDIOVERTER DEFIBRILLATOR UPGRADE;  Surgeon: Evans Lance, MD;  Location: [facility] CATH LAB;  Service: Cardiovascular;  Laterality: N/A;  . BIV ICD GENERATOR CHANGEOUT N/A 01/30/2019   Procedure: BIV ICD GENERATOR CHANGEOUT;  Surgeon: Evans Lance, MD;  Location: [facility] CV LAB;  Service: Cardiovascular;  Laterality: N/A;  . CARDIAC CATHETERIZATION    . CARDIAC VALVE REPLACEMENT    . CARDIOVERSION N/A 11/13/2016   Procedure: CARDIOVERSION;  Surgeon: Sanda Klein, MD;  Location: [facility] ENDOSCOPY;  Service: Cardiovascular;  Laterality: N/A;  . COLONOSCOPY  06/12/2007   Rourk- Normal rectum/Normal colon  . COLONOSCOPY  12/16/02   small hemorrhoids  . COLONOSCOPY  06/20/2012   Procedure: COLONOSCOPY;  Surgeon: Daneil Dolin, MD;  Location: [facility] ENDO SUITE;  Service: Endoscopy;  Laterality: N/A;  10:45  . defibrillator implanted 2006    . DOPPLER ECHOCARDIOGRAPHY  2012  . DOPPLER ECHOCARDIOGRAPHY  05,06,07,08,09,2011  . ESOPHAGOGASTRODUODENOSCOPY  06/12/2007   Rourk- normal esophagus, small hiatal hernia, otherwise normal stomach, D1, D2  . EYE SURGERY Right 2005  . EYE SURGERY Left 2006  . ICD LEAD REMOVAL Left 02/08/2015   Procedure: ICD LEAD REMOVAL;  Surgeon: Evans Lance, MD;  Location: [facility] OR;  Service: Cardiovascular;  Laterality: Left;  "Will plan extraction and insertion of a BiV  PM"  **Dr. Roxan Hockey backing up case**  . IMPLANTABLE CARDIOVERTER DEFIBRILLATOR (ICD) GENERATOR CHANGE Left 02/08/2015   Procedure: ICD GENERATOR CHANGE;  Surgeon: Evans Lance, MD;  Location: [facility];  Service: Cardiovascular;  Laterality: Left;  . INSERT / REPLACE / REMOVE PACEMAKER    . PACEMAKER INSERTION  May 2016  . pacemaker placed  2004  . right breast cyst removed     benign   . right cataract removed     2005  . TEE WITHOUT CARDIOVERSION N/A 11/13/2016   Procedure: TRANSESOPHAGEAL ECHOCARDIOGRAM (TEE);  Surgeon: Sanda Klein, MD;  Location: [facility] ENDOSCOPY;  Service: Cardiovascular;  Laterality: N/A;  . THYROIDECTOMY       OB History    Gravida      Para      Term      Preterm      AB      Living  0     SAB      TAB      Ectopic      Multiple      Live Births              Family History  Problem Relation Age of Onset  . Pancreatic cancer Mother   . Heart disease Father   . Heart disease Sister   . Heart attack Sister   . Heart disease Brother   . Diabetes Brother   . Heart disease Brother   . Diabetes Brother   . Hypertension Brother   . Lung cancer Brother     Social History   Tobacco Use  . Smoking status: Former Smoker    Packs/day: 0.75    Years: 40.00    Pack years: 30.00    Types: Cigarettes    Quit date: 03/08/1987    Years since quitting: 33.3  . Smokeless tobacco: Never Used  Vaping Use  . Vaping Use: Never used  Substance Use Topics  . Alcohol use: No    Alcohol/week: 0.0 standard drinks  . Drug use: No    Home Medications Prior to Admission medications   Medication Sig Start Date End Date Taking? Authorizing Provider  acetaminophen (TYLENOL) 500 MG tablet Take 1 tablet (500 mg total) by mouth every 6 (six) hours as needed (pain). 07/08/20   Oretha Milch D, MD  albuterol (PROVENTIL) (2.5 MG/3ML) 0.083% nebulizer solution Take 3 mLs (2.5 mg total) by nebulization in the morning and at bedtime. *May use additional  one time if needed for shortness of breath 07/08/20   Oretha Milch D, MD  albuterol (VENTOLIN HFA) 108 (90 Base) MCG/ACT inhaler Inhale 1-2 puffs into the lungs every 6 (six) hours as needed for wheezing or shortness of breath.  07/08/20   Desiree Hane, MD  allopurinol (ZYLOPRIM) 100 MG tablet Take 1 tablet (100 mg total) by mouth in the morning. 04/02/20   Johnson, Clanford L, MD  BREO ELLIPTA 100-25 MCG/INH AEPB 1 TAKE 1 INHALATION BY MOUTH ONCE DAILY 07/08/20   Oretha Milch D, MD  cholecalciferol (VITAMIN D3) 25 MCG (1000 UNIT) tablet Take 1 tablet (1,000 Units total) by mouth in the morning. 07/08/20   Desiree Hane, MD  fluticasone (FLONASE) 50 MCG/ACT nasal spray Place 2 sprays into both nostrils daily. 07/08/20   Desiree Hane, MD  folic acid (FOLVITE) 1 MG tablet TAKE 1 TABLET(1 MG) BY MOUTH DAILY 07/08/20   Oretha Milch D, MD  furosemide (LASIX) 40 MG tablet Take 1 tablet (40 mg total) by mouth daily. 07/09/20   Oretha Milch D, MD  gabapentin (NEURONTIN) 300 MG capsule TAKE 1 CAPSULE(300 MG) BY MOUTH TWICE DAILY 07/08/20   Oretha Milch D, MD  levETIRAcetam (KEPPRA) 500 MG tablet Take 1 tablet (500 mg total) by mouth 2 (two) times daily. 07/08/20   Desiree Hane, MD  Liniments (SALONPAS PAIN RELIEF PATCH EX) Apply 1 patch topically daily as needed (pain).     [provider]  meclizine (ANTIVERT) 12.5 MG tablet TAKE 1 TABLET(12.5 MG) BY MOUTH THREE TIMES DAILY AS NEEDED FOR DIZZINESS 07/08/20   Oretha Milch D, MD  metoprolol succinate (TOPROL-XL) 100 MG 24 hr tablet Take 1 tablet (100 mg total) by mouth daily. TAKE 1 TABLET BY MOUTH EVERY DAY WITH FOOD OR MILK 07/08/20   Oretha Milch D, MD  nitroGLYCERIN (NITROSTAT) 0.4 MG SL tablet Place 1 tablet (0.4 mg total) under the tongue every 5 (five) minutes as needed for chest pain. 07/08/20   Desiree Hane, MD  ondansetron (ZOFRAN ODT) 4 MG disintegrating tablet Take 1 tablet (4 mg total) by mouth every 8 (eight)  hours as needed for nausea or vomiting. 07/08/20   Desiree Hane, MD  Polyethyl [name] (SYSTANE) 0.4-0.3 % GEL ophthalmic gel Place 1 application into the left eye at bedtime. 07/08/20   Oretha Milch D, MD  polyethylene glycol (MIRALAX / GLYCOLAX) 17 g packet Take 17 g by mouth daily as needed. 07/08/20   Oretha Milch D, MD  potassium chloride SA (KLOR-CON) 20 MEQ tablet Take 1 tablet (20 mEq total) by mouth daily. TAKE 40MEQ DAILY x3 DAYS THEN BACK TO 20MEQ DAILY Patient taking differently: Take 20 mEq by mouth daily.  04/23/20   Deberah Pelton, NP  pravastatin (PRAVACHOL) 40 MG tablet Take 1 tablet (40 mg total) by mouth daily. 07/08/20   Desiree Hane, MD  senna (SENOKOT) 8.6 MG TABS tablet Take 2 tablets (17.2 mg total) by mouth daily as needed for mild constipation. 07/08/20   Desiree Hane, MD  SYNTHROID 75 MCG tablet Take 1 tablet (75 mcg total) by mouth daily before breakfast. 07/08/20   Oretha Milch D, MD  warfarin (JANTOVEN) 5 MG tablet Take 1-2 tablets (5-10 mg total) by mouth See admin instructions. TAKE 2 TABLETS EVERY DAY EXCEPT 1 TABLET ON TUESDAYS @ 8:30PM 07/08/20   Oretha Milch D, MD    Allergies    Penicillins, Aspirin, Fentanyl, and Niacin  Review of Systems   Review of Systems  Respiratory: Positive for shortness of breath.   All other systems reviewed and are negative.   Physical Exam Updated Vital Signs BP 130/78   Pulse 60   Temp 98.2 F (36.8 C) (Oral)  Resp 18   Ht 5\' 7"  (1.702 m)   Wt 86.2 kg   SpO2 100%   BMI 29.76 kg/m   Physical Exam Vitals and nursing note reviewed.   86 year old female, resting comfortably and in no acute distress. Vital signs are normal. Oxygen saturation is 100%, which is normal, but only obtained with being on oxygen 6 L/min by nasal cannula. Head is normocephalic and atraumatic. PERRLA, EOMI. Oropharynx is clear. Neck is nontender and supple without adenopathy. JVD is present. Back is  nontender and there is no CVA tenderness. Lungs have bibasilar rales with scattered wheezes.  There are no rhonchi. Chest is nontender. Heart has regular rate and rhythm without murmur. Abdomen is soft, flat, nontender without masses or hepatosplenomegaly and peristalsis is normoactive. Extremities have trace edema, full range of motion is present. Skin is warm and dry without rash. Neurologic: Mental status is normal, cranial nerves are intact, there are no motor or sensory deficits.  ED Results / Procedures / Treatments   Labs (all labs ordered are listed, but only abnormal results are displayed) Labs Reviewed  BASIC METABOLIC PANEL - Abnormal; Notable for the following components:      Result Value   Chloride 95 (*)    CO2 33 (*)    Glucose, Bld 121 (*)    BUN 65 (*)    Creatinine, Ser 1.83 (*)    GFR, Estimated 25 (*)    All other components within normal limits  BRAIN NATRIURETIC PEPTIDE - Abnormal; Notable for the following components:   B Natriuretic Peptide 913.0 (*)    All other components within normal limits  CBC WITH DIFFERENTIAL/PLATELET - Abnormal; Notable for the following components:   RBC 3.21 (*)    Hemoglobin 9.2 (*)    HCT 30.7 (*)    RDW 16.5 (*)    Lymphs Abs 0.6 (*)    All other components within normal limits  PROTIME-INR - Abnormal; Notable for the following components:   Prothrombin Time 31.5 (*)    INR 3.2 (*)    All other components within normal limits  TROPONIN I (HIGH SENSITIVITY) - Abnormal; Notable for the following components:   Troponin I (High Sensitivity) 58 (*)    All other components within normal limits  TROPONIN I (HIGH SENSITIVITY) - Abnormal; Notable for the following components:   Troponin I (High Sensitivity) 51 (*)    All other components within normal limits  RESPIRATORY PANEL BY RT PCR (FLU A&B, COVID)    EKG EKG Interpretation  Date/Time:  Friday July 09 2020 00:33:23 EDT Ventricular Rate:  51 PR Interval:    QRS  Duration: 135 QT Interval:  496 QTC Calculation: 457 R Axis:   35 Text Interpretation: VENTRICULAR PACED RHYTHM When compared with ECG of 07/03/2020, No significant change was found Confirmed by Delora Fuel ([id number]) on 07/09/2020 12:47:40 AM   Radiology DG CHEST PORT 1 VIEW  Result Date: 07/08/2020 CLINICAL DATA:  Shortness of breath. EXAM: PORTABLE CHEST 1 VIEW COMPARISON:  Single-view of the chest 07/04/2020 07/03/2020. FINDINGS: The lungs are emphysematous. There is cardiomegaly. Left basilar airspace disease is unchanged. The patient is status post mitral and tricuspid valve replacement. AICD is in place. Aortic atherosclerosis. No acute or focal bony abnormality. IMPRESSION: Left basilar airspace disease could be due to atelectasis or pneumonia, unchanged. Cardiomegaly without edema. Aortic Atherosclerosis (ICD10-I70.0) and Emphysema (ICD10-J43.9). Electronically Signed   By: Inge Rise M.D.   On: 07/08/2020 11:17   Procedures  Procedures   Medications Ordered in ED Medications  furosemide (LASIX) injection 80 mg (80 mg Intravenous Given 07/09/20 0038)    ED Course  I have reviewed the triage vital signs and the nursing notes.  Pertinent labs & imaging results that were available during my care of the patient were reviewed by me and considered in my medical decision making (see chart for details).  MDM Rules/Calculators/A&P Episode of dyspnea with hypoxia, improved on additional nasal oxygen.  Exam suggestive of heart failure exacerbation.  Chest x-ray shows no acute process, but she does have chronic cardiomegaly and chronic interstitial opacities.  Will check screening labs, ECG.  Of note, she does present with DNR paperwork.  Old records are reviewed, and she was just discharged from [facility] earlier today with diagnosis of heart failure exacerbation.  ECG shows paced rhythm.  Patient's oxygen was turned down to 3 L and she maintained adequate oxygen saturation.   BNP is elevated compared with baseline.  Renal insufficiency is present not significantly changed from baseline.  Anemia is present actually improved over baseline.  Will give extra dose of furosemide and return to skilled nursing facility on increased dose of furosemide for the next 3 days.  Patient was observed in the ED and continued to maintain good oxygen saturation on 3 L oxygen via nasal cannula.  Labs show mild elevation of troponin which appears to be chronic, and is not rising.  She is felt to be safe for discharge with above-noted treatment plan.  Final Clinical Impression(s) / ED Diagnoses Final diagnoses:  Acute on chronic combined systolic and diastolic heart failure (HCC)  Renal insufficiency  Normochromic normocytic anemia  Elevated troponin  Anticoagulated on warfarin    Rx / DC Orders ED Discharge Orders    None       Delora Fuel, MD 71/24/58 [phone]

## 2020-07-08 NOTE — ED Notes (Signed)
Unable to lay pt flat due to o2 desaturations to low to mid 70's.

## 2020-07-08 NOTE — Discharge Summary (Addendum)
[name] [name] GGE:[id number] DOB: Nov 28, 1931 DOA: 06/29/2020  PCP: Fayrene Helper, MD  Admit date: 06/29/2020 Discharge date: 07/08/2020  Admitted From: home Disposition:  home  Recommendations for Outpatient Follow-up:  1. Follow up with PCP in 1-2 weeks 2. Discontinued home hydralazine and imdur given hypotension after seizure 3. Started on Keppra 500 mg BID for new onset seizure 4. Patient is DNR. Would  benefity from outpatient palliative care per palliative care consultants during hospital stay 5. Please obtain BMP/CBC in one week    Equipment/Devices: 3 L oxygen  Discharge Condition: Stable CODE STATUS: DNR Diet recommendation: Soft until patient gets her dentures  Brief/Interim Summary: History of present illness:  [name] [name] is a 86 y.o. year old female with Medical history significant for mechanical AVR/MVR on Coumadin, HTN, stage III CKD, CHB s/p pacemaker, chronic combined systolic/diastolic CHF who initially presented on 10/5 with worsening shortness of breath and found to have acute on chronic systolic CHF exacerbation with elevated BNP, pulmonary edema, slight AKI. She responded well to IV diuresis and was a planned discharge on 7/9 but while waiting for transport she had a witnessed seizure episode with resultant agonal breathing requiring rescue breathing as well as [facility] course was further complicated by requiring ICU stay and brief course of Levophed for hypotension after her seizure as well as initiation of IV Keppra as recommended by neurology.  EEG showed epileptogenic activity from left temporal region as well as mild diffuse encephalopathy.  Patient able to undergo MRI due to abandoned lead in place (confirmed by her primary cardiologist) although patient does have MRI compatible pacer   Remaining hospital course addressed in problem based format below:   Hospital Course:   New onset seizure on 10/9 with resultant hypotension, stable.  CT  head was unremarkable.  Patient required short course of Levophed in ICU.  Unable to undergo MRI due to abandoned lead in place.  EEG showed epileptiform discharges making patient at high risk for recurrent seizures.  Patient did not have any recurrent seizures during hospital stay -Neurology recommends continuing Keppra 500 mg twice daily -Blood pressure has remained stable with discontinuation of home hydralazine and nitrates, she has tolerated her other blood pressure medications  Acute on chronic combined systolic/diastolic CHF exacerbation, stable.  Patient underwent IV Lasix diuresis during hospital stay and was able to transition to oral Lasix 40 mg daily.  Net -5.5 L this hospital stay -Patient will continue Lasix 40 mg daily (reduced from home dosage given hypotension related to seizure episode addressed above), Toprol-XL, Pravachol  -Hydralazine and nitrates were discontinued as recommended by cardiology due to hypotension addressed above  Persistent atrial fibrillation, rate controlled -Continue Toprol -Continue warfarin, goal INR 2-3.  INR on day of discharge (10/14) 2.9  CKD stage IV, stable at baseline. baseline 1.4-1.7, 1.77 on 10/14.  Has normal output -Avoid nephrotoxins -No ACE/ARB  Hypothyroidism, stable -Continue Synthroid  CHB, status post PPM, stable  Mechanical AVR/MVR, stable.   -Continue Coumadin, currently therapeutic on INR  COPD, stable.  No wheezing.  HTN, stable  Course complicated by hypotension requiring Levophed in the setting of seizure.  Has remained hemodynamically stable since -Tolerating Toprol -Holding home hydralazine/Imdur per cardiology  Transient relative hypoglycemia.  On 10/13 patient had blood glucose of 60 in setting of decreased oral intake related to losing her dentures.  With transition of her regular carb diet to soft diet patient was able to maintain adequate blood glucose levels without any further supplementation -  Continue soft  diet until patient has her dentures  Consultations:  Cardiology, neurology  Procedures/Studies: EEG, 10/12 This study showed evidence of epileptogenic city arising from left temporal region as well as mild diffuse encephalopathy, nonspecific etiology.  No seizures were seen throughout the recording. Subjective: Has some slight abdominal pain still passing flatus.  Ate diet well now that its soft Discharge Exam: Vitals:   07/08/20 0819 07/08/20 1413  BP:  130/61  Pulse:  (!) 59  Resp:  18  Temp:  98.9 F (37.2 C)  SpO2: 93% 98%   Vitals:   07/08/20 0446 07/08/20 0653 07/08/20 0819 07/08/20 1413  BP: (!) 151/65   130/61  Pulse: 63   (!) 59  Resp: 16   18  Temp: 97.9 F (36.6 C)   98.9 F (37.2 C)  TempSrc:    Oral  SpO2: 100%  93% 98%  Weight:  90.7 kg    Height:        General: Lying in bed, no apparent distress Eyes: Left eye proptosis ENT: Oral Mucosa clear and moist Cardiovascular: irregularly irregular rhythm, normal rate , SEM heard with click, no edema, Respiratory: Normal respiratory effort on room 3.5 L, lungs clear to auscultation bilaterally Abdomen: soft, non-distended, non-tender, normal bowel sounds Skin: No Rash Neurologic: Grossly no focal neuro deficit.Mental status AAOx3, speech normal, Psychiatric:Appropriate affect, and mood  Discharge Diagnoses:  Active Problems:   Cardiorenal syndrome with renal failure   CKD (chronic kidney disease), stage IV (HCC)   Chronic anticoagulation, on coumadin for Mechanical AVR and MVR   COPD (chronic obstructive pulmonary disease) (HCC)   CHB (complete heart block) (HCC)   Hypothyroidism   Type 2 diabetes mellitus with hypoglycemia without coma (HCC)   CHF exacerbation (HCC)   Idiopathic hypotension   Goals of care, counseling/discussion   Palliative care by specialist   Seizure [facility])    Discharge Instructions  Discharge Instructions    Call MD for:  difficulty breathing, headache or visual disturbances    Complete by: As directed    Call MD for:  persistant dizziness or light-headedness   Complete by: As directed    Call MD for:  persistant nausea and vomiting   Complete by: As directed    Diet - low sodium heart healthy   Complete by: As directed    Diet - low sodium heart healthy   Complete by: As directed    Diet - low sodium heart healthy   Complete by: As directed    Diet Carb Modified   Complete by: As directed    Discharge instructions   Complete by: As directed    Advised to follow-up with primary care physician in 1 week. Advised to follow-up with cardiology as scheduled. Patient has been advised to take Lasix 60 mg p.o. daily. Home health services has been arranged for the patient.   Increase activity slowly   Complete by: As directed    Increase activity slowly   Complete by: As directed    Increase activity slowly   Complete by: As directed      Allergies as of 07/08/2020      Reactions   Penicillins Other (See Comments)   Bruise   Aspirin Other (See Comments)   On coumadin   Fentanyl Dermatitis   Rash with patch   Niacin Itching      Medication List    STOP taking these medications   benzonatate 100 MG capsule Commonly known as: TESSALON  docusate sodium 100 MG capsule Commonly known as: COLACE   hydrALAZINE 25 MG tablet Commonly known as: APRESOLINE   isosorbide mononitrate 30 MG 24 hr tablet Commonly known as: IMDUR   metolazone 2.5 MG tablet Commonly known as: ZAROXOLYN   polyethylene glycol powder 17 GM/SCOOP powder Commonly known as: GLYCOLAX/MIRALAX Replaced by: polyethylene glycol 17 g packet     TAKE these medications   acetaminophen 500 MG tablet Commonly known as: TYLENOL Take 1 tablet (500 mg total) by mouth every 6 (six) hours as needed (pain).   albuterol 108 (90 Base) MCG/ACT inhaler Commonly known as: VENTOLIN HFA Inhale 1-2 puffs into the lungs every 6 (six) hours as needed for wheezing or shortness of breath.    albuterol (2.5 MG/3ML) 0.083% nebulizer solution Commonly known as: PROVENTIL Take 3 mLs (2.5 mg total) by nebulization in the morning and at bedtime. *May use additional one time if needed for shortness of breath   allopurinol 100 MG tablet Commonly known as: ZYLOPRIM Take 1 tablet (100 mg total) by mouth in the morning.   Breo Ellipta 100-25 MCG/INH Aepb Generic drug: fluticasone furoate-vilanterol 1 TAKE 1 INHALATION BY MOUTH ONCE DAILY   cholecalciferol 25 MCG (1000 UNIT) tablet Commonly known as: VITAMIN D3 Take 1 tablet (1,000 Units total) by mouth in the morning.   fluticasone 50 MCG/ACT nasal spray Commonly known as: FLONASE Place 2 sprays into both nostrils daily. What changed: See the new instructions.   folic acid 1 MG tablet Commonly known as: FOLVITE TAKE 1 TABLET(1 MG) BY MOUTH DAILY   furosemide 40 MG tablet Commonly known as: LASIX Take 1 tablet (40 mg total) by mouth daily. Start taking on: July 09, 2020 What changed: additional instructions   gabapentin 300 MG capsule Commonly known as: NEURONTIN TAKE 1 CAPSULE(300 MG) BY MOUTH TWICE DAILY What changed: See the new instructions.   levETIRAcetam 500 MG tablet Commonly known as: KEPPRA Take 1 tablet (500 mg total) by mouth 2 (two) times daily.   meclizine 12.5 MG tablet Commonly known as: ANTIVERT TAKE 1 TABLET(12.5 MG) BY MOUTH THREE TIMES DAILY AS NEEDED FOR DIZZINESS What changed: See the new instructions.   metoprolol succinate 100 MG 24 hr tablet Commonly known as: TOPROL-XL Take 1 tablet (100 mg total) by mouth daily. TAKE 1 TABLET BY MOUTH EVERY DAY WITH FOOD OR MILK   nitroGLYCERIN 0.4 MG SL tablet Commonly known as: NITROSTAT Place 1 tablet (0.4 mg total) under the tongue every 5 (five) minutes as needed for chest pain.   ondansetron 4 MG disintegrating tablet Commonly known as: Zofran ODT Take 1 tablet (4 mg total) by mouth every 8 (eight) hours as needed for nausea or vomiting.    polyethylene glycol 17 g packet Commonly known as: MIRALAX / GLYCOLAX Take 17 g by mouth daily as needed. Replaces: polyethylene glycol powder 17 GM/SCOOP powder   potassium chloride SA 20 MEQ tablet Commonly known as: KLOR-CON Take 1 tablet (20 mEq total) by mouth daily. TAKE 40MEQ DAILY x3 DAYS THEN BACK TO 20MEQ DAILY What changed: additional instructions   pravastatin 40 MG tablet Commonly known as: PRAVACHOL Take 1 tablet (40 mg total) by mouth daily.   SALONPAS PAIN RELIEF PATCH EX Apply 1 patch topically daily as needed (pain).   senna 8.6 MG Tabs tablet Commonly known as: SENOKOT Take 2 tablets (17.2 mg total) by mouth daily as needed for mild constipation.   Synthroid 75 MCG tablet Generic drug: levothyroxine Take 1 tablet (75  mcg total) by mouth daily before breakfast.   Systane 0.4-0.3 % Gel ophthalmic gel Generic drug: Polyethyl [name] Place 1 application into the left eye at bedtime.   warfarin 5 MG tablet Commonly known as: Jantoven Take as directed. If you are unsure how to take this medication, talk to your nurse or doctor. Original instructions: Take 1-2 tablets (5-10 mg total) by mouth See admin instructions. TAKE 2 TABLETS EVERY DAY EXCEPT 1 TABLET ON TUESDAYS @ 8:30PM       Contact information for follow-up providers    Deberah Pelton, NP Follow up on 07/23/2020.   Specialty: Cardiology [facility]: [facility] Follow-up on 07/23/2020 at 11:45 AM with Coletta Memos, NP (Works with Dr. Sallyanne Kuster) Contact information: [address] [place] [address] [phone]        Fayrene Helper, MD Follow up in 1 week(s).   Specialty: Family Medicine Contact information: [address] [place] [address] [phone]        Sanda Klein, MD .   Specialty: Cardiology Contact information: [address] [place] [address] [phone]        Evans Lance, MD .    Specialty: Cardiology Contact information: [phone] [place] [address] [phone]            Contact information for after-discharge care    [facility] .   Service: Skilled Nursing Contact information: [address] [place] [address] [phone]                 Allergies  Allergen Reactions  . Penicillins Other (See Comments)    Bruise    . Aspirin Other (See Comments)    On coumadin  . Fentanyl Dermatitis    Rash with patch   . Niacin Itching        The results of significant diagnostics from this hospitalization (including imaging, microbiology, ancillary and laboratory) are listed below for reference.     Microbiology: Recent Results (from the past 240 hour(s))  Respiratory Panel by RT PCR (Flu A&B, Covid) - Nasopharyngeal Swab     Status: None   Collection Time: 06/29/20  7:57 PM   Specimen: Nasopharyngeal Swab  Result Value Ref Range Status   SARS Coronavirus 2 by RT PCR NEGATIVE NEGATIVE Final    Comment: (NOTE) SARS-CoV-2 target nucleic acids are NOT DETECTED.  The SARS-CoV-2 RNA is generally detectable in upper respiratoy specimens during the acute phase of infection. The lowest concentration of SARS-CoV-2 viral copies this assay can detect is 131 copies/mL. A negative result does not preclude SARS-Cov-2 infection and should not be used as the sole basis for treatment or other patient management decisions. A negative result may occur with  improper specimen collection/handling, submission of specimen other than nasopharyngeal swab, presence of viral mutation(s) within the areas targeted by this assay, and inadequate number of viral copies (<131 copies/mL). A negative result must be combined with clinical observations, patient history, and epidemiological information. The expected result is Negative.  Fact Sheet for Patients:   PinkCheek.be  Fact Sheet for Healthcare Providers:  GravelBags.it  This test is no t yet approved or cleared by the [place] FDA and  has been authorized for detection and/or diagnosis of SARS-CoV-2 by FDA under an Emergency Use Authorization (EUA). This EUA will remain  in effect (meaning this test can be used) for the  duration of the COVID-19 declaration under Section 564(b)(1) of the Act, 21 U.S.C. section 360bbb-3(b)(1), unless the authorization is terminated or revoked sooner.     Influenza A by PCR NEGATIVE NEGATIVE Final   Influenza B by PCR NEGATIVE NEGATIVE Final    Comment: (NOTE) The Xpert Xpress SARS-CoV-2/FLU/RSV assay is intended as an aid in  the diagnosis of influenza from Nasopharyngeal swab specimens and  should not be used as a sole basis for treatment. Nasal washings and  aspirates are unacceptable for Xpert Xpress SARS-CoV-2/FLU/RSV  testing.  Fact Sheet for Patients: PinkCheek.be  Fact Sheet for Healthcare Providers: GravelBags.it  This test is not yet approved or cleared by the [place] FDA and  has been authorized for detection and/or diagnosis of SARS-CoV-2 by  FDA under an Emergency Use Authorization (EUA). This EUA will remain  in effect (meaning this test can be used) for the duration of the  Covid-19 declaration under Section 564(b)(1) of the Act, 21  U.S.C. section 360bbb-3(b)(1), unless the authorization is  terminated or revoked. Performed at [facility], [address], [place], [place] [address]   SARS Coronavirus 2 by RT PCR (hospital order, performed in [facility] hospital lab) Nasopharyngeal Nasopharyngeal Swab     Status: None   Collection Time: 07/07/20  5:30 PM   Specimen: Nasopharyngeal Swab  Result Value Ref Range Status   SARS Coronavirus 2 NEGATIVE NEGATIVE Final    Comment: (NOTE) SARS-CoV-2 target  nucleic acids are NOT DETECTED.  The SARS-CoV-2 RNA is generally detectable in upper and lower respiratory specimens during the acute phase of infection. The lowest concentration of SARS-CoV-2 viral copies this assay can detect is 250 copies / mL. A negative result does not preclude SARS-CoV-2 infection and should not be used as the sole basis for treatment or other patient management decisions.  A negative result may occur with improper specimen collection / handling, submission of specimen other than nasopharyngeal swab, presence of viral mutation(s) within the areas targeted by this assay, and inadequate number of viral copies (<250 copies / mL). A negative result must be combined with clinical observations, patient history, and epidemiological information.  Fact Sheet for Patients:   StrictlyIdeas.no  Fact Sheet for Healthcare Providers: BankingDealers.co.za  This test is not yet approved or  cleared by the [place] FDA and has been authorized for detection and/or diagnosis of SARS-CoV-2 by FDA under an Emergency Use Authorization (EUA).  This EUA will remain in effect (meaning this test can be used) for the duration of the COVID-19 declaration under Section 564(b)(1) of the Act, 21 U.S.C. section 360bbb-3(b)(1), unless the authorization is terminated or revoked sooner.  Performed at [facility], [address], [place], [place] [address]      Labs: BNP (last 3 results) Recent Labs    03/30/20 1202 05/27/20 1934 06/29/20 2002  BNP 502.0* 572.0* 921.1*   Basic Metabolic Panel: Recent Labs  Lab 07/03/20 0621 07/03/20 1622 07/05/20 0457 07/06/20 0353 07/07/20 0656  NA 138 138 138 138 138  K 3.8 4.0 3.6 3.7 4.0  CL 94* 94* 97* 98 98  CO2 35* 27 32 31 34*  GLUCOSE 91 225* 90 86 90  BUN 61* 55* 62* 65* 63*  CREATININE 1.69* 1.79* 1.86* 1.89* 1.77*  CALCIUM 8.7* 8.6* 8.4* 8.3* 8.6*  MG 2.0 2.2 1.9 2.0 2.1  PHOS  3.4 5.2* 3.6 3.7 3.8   Liver Function Tests: Recent Labs  Lab 07/03/20 1622 07/05/20 0457 07/07/20 0656  AST  43* 26 27  ALT 19 15 18   ALKPHOS 57 45 48  BILITOT 0.6 1.0 0.6  PROT 6.9 6.2* 6.5  ALBUMIN 3.0* 2.6* 2.7*   No results for input(s): LIPASE, AMYLASE in the last 168 hours. No results for input(s): AMMONIA in the last 168 hours. CBC: Recent Labs  Lab 07/03/20 0621 07/03/20 0621 07/03/20 1622 07/04/20 0625 07/05/20 0457 07/06/20 0353 07/07/20 0656  WBC 4.9  --  9.0 12.3* 9.4  --  6.1  HGB 9.0*   < > 9.7* 9.3* 8.1* 8.0* 8.5*  HCT 29.4*   < > 32.3* 29.1* 26.2* 25.4* 28.0*  MCV 93.9  --  98.2 90.9 94.2  --  95.2  PLT 175  --  205 182 157  --  181   < > = values in this interval not displayed.   Cardiac Enzymes: No results for input(s): CKTOTAL, CKMB, CKMBINDEX, TROPONINI in the last 168 hours. BNP: Invalid input(s): POCBNP CBG: Recent Labs  Lab 07/07/20 2054 07/08/20 0020 07/08/20 0443 07/08/20 0751 07/08/20 1107  GLUCAP 103* 105* 90 83 91   D-Dimer No results for input(s): DDIMER in the last 72 hours. Hgb A1c No results for input(s): HGBA1C in the last 72 hours. Lipid Profile No results for input(s): CHOL, HDL, LDLCALC, TRIG, CHOLHDL, LDLDIRECT in the last 72 hours. Thyroid function studies No results for input(s): TSH, T4TOTAL, T3FREE, THYROIDAB in the last 72 hours.  Invalid input(s): FREET3 Anemia work up No results for input(s): VITAMINB12, FOLATE, FERRITIN, TIBC, IRON, RETICCTPCT in the last 72 hours. Urinalysis    Component Value Date/Time   COLORURINE YELLOW 01/26/2020 2055   APPEARANCEUR CLEAR 01/26/2020 2055   LABSPEC 1.010 01/26/2020 2055   PHURINE 5.0 01/26/2020 2055   GLUCOSEU NEGATIVE 01/26/2020 2055   GLUCOSEU NEG mg/dL 03/22/2010 2313   HGBUR NEGATIVE 01/26/2020 2055   HGBUR negative 05/27/2010 1122   BILIRUBINUR NEGATIVE 01/26/2020 2055   BILIRUBINUR negative 04/28/2019 1500   BILIRUBINUR neg 03/11/2013 1443   KETONESUR  NEGATIVE 01/26/2020 2055   PROTEINUR NEGATIVE 01/26/2020 2055   UROBILINOGEN 0.2 04/28/2019 1500   UROBILINOGEN 0.2 03/27/2012 1201   NITRITE NEGATIVE 01/26/2020 2055   LEUKOCYTESUR NEGATIVE 01/26/2020 2055   Sepsis Labs Invalid input(s): PROCALCITONIN,  WBC,  LACTICIDVEN Microbiology Recent Results (from the past 240 hour(s))  Respiratory Panel by RT PCR (Flu A&B, Covid) - Nasopharyngeal Swab     Status: None   Collection Time: 06/29/20  7:57 PM   Specimen: Nasopharyngeal Swab  Result Value Ref Range Status   SARS Coronavirus 2 by RT PCR NEGATIVE NEGATIVE Final    Comment: (NOTE) SARS-CoV-2 target nucleic acids are NOT DETECTED.  The SARS-CoV-2 RNA is generally detectable in upper respiratoy specimens during the acute phase of infection. The lowest concentration of SARS-CoV-2 viral copies this assay can detect is 131 copies/mL. A negative result does not preclude SARS-Cov-2 infection and should not be used as the sole basis for treatment or other patient management decisions. A negative result may occur with  improper specimen collection/handling, submission of specimen other than nasopharyngeal swab, presence of viral mutation(s) within the areas targeted by this assay, and inadequate number of viral copies (<131 copies/mL). A negative result must be combined with clinical observations, patient history, and epidemiological information. The expected result is Negative.  Fact Sheet for Patients:  PinkCheek.be  Fact Sheet for Healthcare Providers:  GravelBags.it  This test is no t yet approved or cleared by the [place] FDA and  has  been authorized for detection and/or diagnosis of SARS-CoV-2 by FDA under an Emergency Use Authorization (EUA). This EUA will remain  in effect (meaning this test can be used) for the duration of the COVID-19 declaration under Section 564(b)(1) of the Act, 21 U.S.C. section  360bbb-3(b)(1), unless the authorization is terminated or revoked sooner.     Influenza A by PCR NEGATIVE NEGATIVE Final   Influenza B by PCR NEGATIVE NEGATIVE Final    Comment: (NOTE) The Xpert Xpress SARS-CoV-2/FLU/RSV assay is intended as an aid in  the diagnosis of influenza from Nasopharyngeal swab specimens and  should not be used as a sole basis for treatment. Nasal washings and  aspirates are unacceptable for Xpert Xpress SARS-CoV-2/FLU/RSV  testing.  Fact Sheet for Patients: PinkCheek.be  Fact Sheet for Healthcare Providers: GravelBags.it  This test is not yet approved or cleared by the [place] FDA and  has been authorized for detection and/or diagnosis of SARS-CoV-2 by  FDA under an Emergency Use Authorization (EUA). This EUA will remain  in effect (meaning this test can be used) for the duration of the  Covid-19 declaration under Section 564(b)(1) of the Act, 21  U.S.C. section 360bbb-3(b)(1), unless the authorization is  terminated or revoked. Performed at [facility], [address] [place], [place] [address]   SARS Coronavirus 2 by RT PCR (hospital order, performed in [facility] hospital lab) Nasopharyngeal Nasopharyngeal Swab     Status: None   Collection Time: 07/07/20  5:30 PM   Specimen: Nasopharyngeal Swab  Result Value Ref Range Status   SARS Coronavirus 2 NEGATIVE NEGATIVE Final    Comment: (NOTE) SARS-CoV-2 target nucleic acids are NOT DETECTED.  The SARS-CoV-2 RNA is generally detectable in upper and lower respiratory specimens during the acute phase of infection. The lowest concentration of SARS-CoV-2 viral copies this assay can detect is 250 copies / mL. A negative result does not preclude SARS-CoV-2 infection and should not be used as the sole basis for treatment or other patient management decisions.  A negative result may occur with improper specimen collection / handling, submission  of specimen other than nasopharyngeal swab, presence of viral mutation(s) within the areas targeted by this assay, and inadequate number of viral copies (<250 copies / mL). A negative result must be combined with clinical observations, patient history, and epidemiological information.  Fact Sheet for Patients:   StrictlyIdeas.no  Fact Sheet for Healthcare Providers: BankingDealers.co.za  This test is not yet approved or  cleared by the [place] FDA and has been authorized for detection and/or diagnosis of SARS-CoV-2 by FDA under an Emergency Use Authorization (EUA).  This EUA will remain in effect (meaning this test can be used) for the duration of the COVID-19 declaration under Section 564(b)(1) of the Act, 21 U.S.C. section 360bbb-3(b)(1), unless the authorization is terminated or revoked sooner.  Performed at [facility], [address], [place], [place] [address]      Time coordinating discharge: Over 30 minutes  SIGNED:   Desiree Hane, MD  [facility] 07/08/2020, 2:19 PM Pager   If 7PM-7AM, please contact night-coverage [email or web address] Password [email or web address]

## 2020-07-09 ENCOUNTER — Ambulatory Visit (HOSPITAL_COMMUNITY): Payer: Medicare Other | Admitting: Nurse Practitioner

## 2020-07-09 ENCOUNTER — Other Ambulatory Visit: Payer: Self-pay

## 2020-07-09 ENCOUNTER — Inpatient Hospital Stay
Admission: RE | Admit: 2020-07-09 | Discharge: 2020-08-12 | Disposition: A | Discharge status: Home / Self Care | Payer: Medicare Other | Source: Ambulatory Visit | Attending: Internal Medicine | Admitting: Internal Medicine

## 2020-07-09 ENCOUNTER — Non-Acute Institutional Stay (SKILLED_NURSING_FACILITY): Payer: Medicare Other | Admitting: Adult Health

## 2020-07-09 ENCOUNTER — Ambulatory Visit (HOSPITAL_COMMUNITY): Payer: Medicare Other

## 2020-07-09 ENCOUNTER — Encounter: Payer: Self-pay | Admitting: Adult Health

## 2020-07-09 ENCOUNTER — Other Ambulatory Visit (HOSPITAL_COMMUNITY)
Admission: RE | Admit: 2020-07-09 | Discharge: 2020-07-09 | Disposition: A | Discharge status: Home / Self Care | Payer: Medicare Other | Source: Skilled Nursing Facility | Attending: Internal Medicine | Admitting: Internal Medicine

## 2020-07-09 DIAGNOSIS — Z950 Presence of cardiac pacemaker: Secondary | ICD-10-CM | POA: Diagnosis not present

## 2020-07-09 DIAGNOSIS — Z20822 Contact with and (suspected) exposure to covid-19: Secondary | ICD-10-CM | POA: Diagnosis not present

## 2020-07-09 DIAGNOSIS — R569 Unspecified convulsions: Secondary | ICD-10-CM

## 2020-07-09 DIAGNOSIS — Z79899 Other long term (current) drug therapy: Secondary | ICD-10-CM | POA: Diagnosis not present

## 2020-07-09 DIAGNOSIS — E785 Hyperlipidemia, unspecified: Secondary | ICD-10-CM | POA: Diagnosis not present

## 2020-07-09 DIAGNOSIS — I428 Other cardiomyopathies: Secondary | ICD-10-CM | POA: Diagnosis not present

## 2020-07-09 DIAGNOSIS — K5909 Other constipation: Secondary | ICD-10-CM | POA: Diagnosis not present

## 2020-07-09 DIAGNOSIS — R41841 Cognitive communication deficit: Secondary | ICD-10-CM | POA: Diagnosis not present

## 2020-07-09 DIAGNOSIS — K219 Gastro-esophageal reflux disease without esophagitis: Secondary | ICD-10-CM | POA: Diagnosis not present

## 2020-07-09 DIAGNOSIS — Z7189 Other specified counseling: Secondary | ICD-10-CM | POA: Diagnosis not present

## 2020-07-09 DIAGNOSIS — J42 Unspecified chronic bronchitis: Secondary | ICD-10-CM

## 2020-07-09 DIAGNOSIS — M1A30X0 Chronic gout due to renal impairment, unspecified site, without tophus (tophi): Secondary | ICD-10-CM | POA: Diagnosis not present

## 2020-07-09 DIAGNOSIS — G8929 Other chronic pain: Secondary | ICD-10-CM | POA: Diagnosis not present

## 2020-07-09 DIAGNOSIS — E119 Type 2 diabetes mellitus without complications: Secondary | ICD-10-CM | POA: Diagnosis not present

## 2020-07-09 DIAGNOSIS — I130 Hypertensive heart and chronic kidney disease with heart failure and stage 1 through stage 4 chronic kidney disease, or unspecified chronic kidney disease: Secondary | ICD-10-CM | POA: Diagnosis not present

## 2020-07-09 DIAGNOSIS — M25561 Pain in right knee: Secondary | ICD-10-CM | POA: Diagnosis not present

## 2020-07-09 DIAGNOSIS — R600 Localized edema: Secondary | ICD-10-CM | POA: Diagnosis not present

## 2020-07-09 DIAGNOSIS — I442 Atrioventricular block, complete: Secondary | ICD-10-CM | POA: Diagnosis not present

## 2020-07-09 DIAGNOSIS — R1310 Dysphagia, unspecified: Secondary | ICD-10-CM | POA: Diagnosis not present

## 2020-07-09 DIAGNOSIS — I10 Essential (primary) hypertension: Secondary | ICD-10-CM | POA: Diagnosis not present

## 2020-07-09 DIAGNOSIS — M25512 Pain in left shoulder: Secondary | ICD-10-CM | POA: Diagnosis not present

## 2020-07-09 DIAGNOSIS — Z7901 Long term (current) use of anticoagulants: Secondary | ICD-10-CM

## 2020-07-09 DIAGNOSIS — I5023 Acute on chronic systolic (congestive) heart failure: Secondary | ICD-10-CM

## 2020-07-09 DIAGNOSIS — I5043 Acute on chronic combined systolic (congestive) and diastolic (congestive) heart failure: Secondary | ICD-10-CM | POA: Diagnosis not present

## 2020-07-09 DIAGNOSIS — J9611 Chronic respiratory failure with hypoxia: Secondary | ICD-10-CM | POA: Diagnosis not present

## 2020-07-09 DIAGNOSIS — E11649 Type 2 diabetes mellitus with hypoglycemia without coma: Secondary | ICD-10-CM

## 2020-07-09 DIAGNOSIS — J449 Chronic obstructive pulmonary disease, unspecified: Secondary | ICD-10-CM | POA: Diagnosis not present

## 2020-07-09 DIAGNOSIS — E1169 Type 2 diabetes mellitus with other specified complication: Secondary | ICD-10-CM | POA: Diagnosis not present

## 2020-07-09 DIAGNOSIS — I4819 Other persistent atrial fibrillation: Secondary | ICD-10-CM | POA: Diagnosis not present

## 2020-07-09 DIAGNOSIS — Z741 Need for assistance with personal care: Secondary | ICD-10-CM | POA: Diagnosis not present

## 2020-07-09 DIAGNOSIS — R262 Difficulty in walking, not elsewhere classified: Secondary | ICD-10-CM | POA: Diagnosis not present

## 2020-07-09 DIAGNOSIS — I5022 Chronic systolic (congestive) heart failure: Secondary | ICD-10-CM

## 2020-07-09 DIAGNOSIS — R300 Dysuria: Secondary | ICD-10-CM | POA: Diagnosis not present

## 2020-07-09 DIAGNOSIS — I5042 Chronic combined systolic (congestive) and diastolic (congestive) heart failure: Secondary | ICD-10-CM | POA: Diagnosis not present

## 2020-07-09 DIAGNOSIS — I4821 Permanent atrial fibrillation: Secondary | ICD-10-CM | POA: Diagnosis not present

## 2020-07-09 DIAGNOSIS — N390 Urinary tract infection, site not specified: Secondary | ICD-10-CM | POA: Diagnosis not present

## 2020-07-09 DIAGNOSIS — E890 Postprocedural hypothyroidism: Secondary | ICD-10-CM | POA: Diagnosis not present

## 2020-07-09 DIAGNOSIS — N184 Chronic kidney disease, stage 4 (severe): Secondary | ICD-10-CM | POA: Diagnosis not present

## 2020-07-09 DIAGNOSIS — R0600 Dyspnea, unspecified: Secondary | ICD-10-CM | POA: Diagnosis not present

## 2020-07-09 DIAGNOSIS — E039 Hypothyroidism, unspecified: Secondary | ICD-10-CM | POA: Diagnosis not present

## 2020-07-09 DIAGNOSIS — R748 Abnormal levels of other serum enzymes: Secondary | ICD-10-CM | POA: Diagnosis not present

## 2020-07-09 DIAGNOSIS — M6281 Muscle weakness (generalized): Secondary | ICD-10-CM | POA: Diagnosis not present

## 2020-07-09 DIAGNOSIS — I4811 Longstanding persistent atrial fibrillation: Secondary | ICD-10-CM | POA: Diagnosis not present

## 2020-07-09 DIAGNOSIS — Z9981 Dependence on supplemental oxygen: Secondary | ICD-10-CM | POA: Diagnosis not present

## 2020-07-09 DIAGNOSIS — Z952 Presence of prosthetic heart valve: Secondary | ICD-10-CM | POA: Diagnosis not present

## 2020-07-09 LAB — BASIC METABOLIC PANEL
Anion gap: 9 (ref 5–15)
BUN: 65 mg/dL — ABNORMAL HIGH (ref 8–23)
CO2: 33 mmol/L — ABNORMAL HIGH (ref 22–32)
Calcium: 8.9 mg/dL (ref 8.9–10.3)
Chloride: 95 mmol/L — ABNORMAL LOW (ref 98–111)
Creatinine, Ser: 1.83 mg/dL — ABNORMAL HIGH (ref 0.44–1.00)
GFR, Estimated: 25 mL/min — ABNORMAL LOW (ref >60)
Glucose, Bld: 121 mg/dL — ABNORMAL HIGH (ref 70–99)
Potassium: 4.2 mmol/L (ref 3.5–5.1)
Sodium: 137 mmol/L (ref 135–145)

## 2020-07-09 LAB — PROTIME-INR
INR: 3.6 — ABNORMAL HIGH (ref 0.8–1.2)
Prothrombin Time: 35 seconds — ABNORMAL HIGH (ref 11.4–15.2)

## 2020-07-09 LAB — TROPONIN I (HIGH SENSITIVITY)
Troponin I (High Sensitivity): 58 ng/L — ABNORMAL HIGH (ref <18)
Troponin I (High Sensitivity): 51 ng/L — ABNORMAL HIGH (ref <18)

## 2020-07-09 LAB — BRAIN NATRIURETIC PEPTIDE: B Natriuretic Peptide: 913 pg/mL — ABNORMAL HIGH (ref 0.0–100.0)

## 2020-07-09 MED ORDER — FUROSEMIDE 10 MG/ML IJ SOLN
80.0000 mg | Freq: Once | INTRAMUSCULAR | Status: AC
  Administered 2020-07-09: 80 mg via INTRAVENOUS
  Filled 2020-07-09: qty 8

## 2020-07-09 NOTE — Progress Notes (Signed)
Location:    [facility] Room Number: 151/P Place of Service:  SNF (31)   CODE STATUS: DNR  Allergies  Allergen Reactions  . Penicillins Other (See Comments)    Bruise    . Aspirin Other (See Comments)    On coumadin  . Fentanyl Dermatitis    Rash with patch   . Niacin Itching    Chief Complaint  Patient presents with  . Hospitalization Follow-up    Hospitalization Follow Up    HPI:  She is a 86 year old woman who has been hospitalized from 06-29-20 through 07-08-20. She was treated for acute on chronic congestive heart failure transitioned from IV lasix to 40 mg po daily. She was waiting for transport home; when she had a new onset seizures with resulting hypotension. Her hydralazine and nitrate were stopped in the hospital by cardiology due to her hypotension. There are no reports of uncontrolled pain; no changes in her appetite; no shortness of breath. She lives with family; her goal is to return back home. She will continue to be followed for her chronic illnesses including: afib; chf; bronchitis.   Past Medical History:  Diagnosis Date  . Acute on chronic diastolic CHF (congestive heart failure) ([place]) 05/19/2012  . Acute on chronic respiratory failure with hypoxia ([place]) 01/31/2020  . Adenomatous polyp of colon 12/16/2002   Dr. [name]/[facility]  . Allergy   . Calculus of gallbladder with chronic cholecystitis without obstruction 02/09/2009   Qualifier: Diagnosis of  By: [name], [name]    . Cataract   . CHB (complete heart block) ([place]) 10/07/2014      . CHF (congestive heart failure) ([place])    a.  EF previously reduced to 25-30% b. EF improved to 60-65% by echo in 10/2017.   Marland Kitchen Chronic kidney disease, stage I 2006  . Constipation       . COPD (chronic obstructive pulmonary disease) ([place])       . DJD (degenerative joint disease) of lumbar spine   . DM (diabetes mellitus) ([place]) 2006  . Esophageal reflux   .  Glaucoma   . Gout       . H/O adenomatous polyp of colon 05/24/2012   2004 in [place] colonoscopy 2008 normal    . H/O heart valve replacement with mechanical valve    a. s/p mechanical AVR in 2001 and mechanical MVR in 2004.  Marland Kitchen H/O wisdom tooth extraction   . HIP PAIN, RIGHT 04/23/2009   Qualifier: Diagnosis of  By: [name], [name]    . Hyperlipemia   . IDA (iron deficiency anemia)    Parenteral iron/[name]  . Insomnia       . Nausea without vomiting 06/07/2010   Qualifier: Diagnosis of  By: [name], [name]    . Non-ischemic cardiomyopathy ([place])    a. s/p MDT CRTD  . Obesity, unspecified   . Other and unspecified hyperlipidemia   . Oxygen deficiency 2014   nocturnal;  . Papilloma of breast 03/06/2012   This was diagnosed as a left breast papilloma by ultrasound characteristics and symptoms in 2010. Because of possible medical risk factors no surgical intervention has been done and it did doing annual followups. The area has been stable by physical examination and mammograms and ultrasound since 2010.   Marland Kitchen Spinal stenosis of lumbar region 02/19/2013  . Spondylolisthesis   . Spondylosis   . Thyroid cancer ([place])    remote  thyroidectomy, no recurrence, pt denies in 2016 thyroid cancer  . Type 2 diabetes mellitus with hyperglycemia ([place]) 07/15/2010  . Unspecified hypothyroidism       . 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 Vertigo         Past Surgical History:  Procedure Laterality Date  . AORTIC AND MITRAL VALVE REPLACEMENT  2001  . BI-VENTRICULAR IMPLANTABLE CARDIOVERTER DEFIBRILLATOR UPGRADE N/A 01/18/2015   Procedure: BI-VENTRICULAR IMPLANTABLE CARDIOVERTER DEFIBRILLATOR UPGRADE;  Surgeon: Evans Lance, MD;  Location: [facility] CATH LAB;  Service: Cardiovascular;  Laterality: N/A;  . BIV ICD GENERATOR CHANGEOUT N/A 01/30/2019   Procedure: BIV ICD GENERATOR CHANGEOUT;  Surgeon: Evans Lance, MD;  Location: [facility] CV LAB;   Service: Cardiovascular;  Laterality: N/A;  . CARDIAC CATHETERIZATION    . CARDIAC VALVE REPLACEMENT    . CARDIOVERSION N/A 11/13/2016   Procedure: CARDIOVERSION;  Surgeon: Sanda Klein, MD;  Location: [facility] ENDOSCOPY;  Service: Cardiovascular;  Laterality: N/A;  . COLONOSCOPY  06/12/2007   Rourk- Normal rectum/Normal colon  . COLONOSCOPY  12/16/02   small hemorrhoids  . COLONOSCOPY  06/20/2012   Procedure: COLONOSCOPY;  Surgeon: Daneil Dolin, MD;  Location: [facility] ENDO SU[facility];  Service: Endoscopy;  Laterality: N/A;  10:45  . defibrillator implanted 2006    . DOPPLER ECHOCARDIOGRAPHY  2012  . DOPPLER ECHOCARDIOGRAPHY  05,06,07,08,09,2011  . ESOPHAGOGASTRODUODENOSCOPY  06/12/2007   Rourk- normal esophagus, small hiatal hernia, otherwise normal stomach, D1, D2  . EYE SURGERY Right 2005  . EYE SURGERY Left 2006  . ICD LEAD REMOVAL Left 02/08/2015   Procedure: ICD LEAD REMOVAL;  Surgeon: Evans Lance, MD;  [facility];  Service: Cardiovascular;  Laterality: Left;  "Will plan extraction and insertion of a BiV PM"  **Dr. Roxan Hockey backing up case**  . IMPLANTABLE CARDIOVERTER DEFIBRILLATOR (ICD) GENERATOR CHANGE Left 02/08/2015   Procedure: ICD GENERATOR CHANGE;  Surgeon: Evans Lance, MD;  Location: [facility];  Service: Cardiovascular;  Laterality: Left;  . INSERT / REPLACE / REMOVE PACEMAKER    . PACEMAKER INSERTION  May 2016  . pacemaker placed  2004  . right breast cyst removed     benign   . right cataract removed     2005  . TEE WITHOUT CARDIOVERSION N/A 11/13/2016   Procedure: TRANSESOPHAGEAL ECHOCARDIOGRAM (TEE);  Surgeon: Sanda Klein, MD;  Location: [facility] ENDOSCOPY;  Service: Cardiovascular;  Laterality: N/A;  . THYROIDECTOMY      Social History   Socioeconomic History  . Marital status: Widowed    Spouse name: Not on file  . Number of children: 0  . Years of education: Not on file  . Highest education level: Not on file  Occupational History  . Occupation: retired     Fish farm manager: RETIRED  Tobacco Use  . Smoking status: Former Smoker    Packs/day: 0.75    Years: 40.00    Pack years: 30.00    Types: Cigarettes    Quit date: 03/08/1987    Years since quitting: 33.3  . Smokeless tobacco: Never Used  Vaping Use  . Vaping Use: Never used  Substance and Sexual Activity  . Alcohol use: No    Alcohol/week: 0.0 standard drinks  . Drug use: No  . Sexual activity: Not Currently  Other Topics Concern  . Not on file  Social History Narrative   From [place] (2004)   Social Determinants of Health   Financial Resource Strain: Low Risk   .  Difficulty of Paying Living Expenses: Not hard at all  Food Insecurity: No Food Insecurity  . Worried About Charity fundraiser in the Last Year: Never true  . Ran Out of Food in the Last Year: Never true  Transportation Needs: No Transportation Needs  . Lack of Transportation (Medical): No  . Lack of Transportation (Non-Medical): No  Physical Activity: Inactive  . Days of Exercise per Week: 0 days  . Minutes of Exercise per Session: 0 min  Stress: No Stress Concern Present  . Feeling of Stress : Only a little  Social Connections: Moderately Isolated  . Frequency of Communication with Friends and Family: More than three times a week  . Frequency of Social Gatherings with Friends and Family: Twice a week  . Attends Religious Services: More than 4 times per year  . Active Member of Clubs or Organizations: No  . Attends Archivist Meetings: Never  . Marital Status: Widowed  Intimate Partner Violence:   . Fear of Current or Ex-Partner: Not on file  . Emotionally Abused: Not on file  . Physically Abused: Not on file  . Sexually Abused: Not on file   Family History  Problem Relation Age of Onset  . Pancreatic cancer Mother   . Heart disease Father   . Heart disease Sister   . Heart attack Sister   . Heart disease Brother   . Diabetes Brother   . Heart disease Brother   . Diabetes Brother   .  Hypertension Brother   . Lung cancer Brother       VITAL SIGNS BP (!) 188/78   Pulse 60   Temp 97.7 F (36.5 C)   Resp 20   Ht 5\' 7"  (1.702 m)   Wt 190 lb (86.2 kg)   BMI 29.76 kg/m   Outpatient Encounter Medications as of 07/09/2020  Medication Sig  . acetaminophen (TYLENOL) 500 MG tablet Take 1 tablet (500 mg total) by mouth every 6 (six) hours as needed (pain).  Marland Kitchen albuterol (PROVENTIL) (2.5 MG/3ML) 0.083% nebulizer solution Take 3 mLs (2.5 mg total) by nebulization in the morning and at bedtime. *May use additional one time if needed for shortness of breath  . albuterol (VENTOLIN HFA) 108 (90 Base) MCG/ACT inhaler Inhale 1 puff into the lungs every 6 (six) hours as needed for wheezing or shortness of breath.  . allopurinol (ZYLOPRIM) 100 MG tablet Take 1 tablet (100 mg total) by mouth in the morning.  Marland Kitchen BREO ELLIPTA 100-25 MCG/INH AEPB 1 TAKE 1 INHALATION BY MOUTH ONCE DAILY  . cholecalciferol (VITAMIN D3) 25 MCG (1000 UNIT) tablet Take 1 tablet (1,000 Units total) by mouth in the morning.  . fluticasone (FLONASE) 50 MCG/ACT nasal spray Place 2 sprays into both nostrils daily.  . folic acid (FOLVITE) 1 MG tablet TAKE 1 TABLET(1 MG) BY MOUTH DAILY  . furosemide (LASIX) 40 MG tablet Take 1 tablet (40 mg total) by mouth daily.  Marland Kitchen gabapentin (NEURONTIN) 300 MG capsule TAKE 1 CAPSULE(300 MG) BY MOUTH TWICE DAILY  . levETIRAcetam (KEPPRA) 500 MG tablet Take 1 tablet (500 mg total) by mouth 2 (two) times daily.  . Liniments (SALONPAS PAIN RELIEF PATCH EX) Apply 1 patch topically daily as needed (pain).   . meclizine (ANTIVERT) 12.5 MG tablet TAKE 1 TABLET(12.5 MG) BY MOUTH THREE TIMES DAILY AS NEEDED FOR DIZZINESS  . metoprolol succinate (TOPROL-XL) 100 MG 24 hr tablet Take 1 tablet (100 mg total) by mouth daily. TAKE 1 TABLET  BY MOUTH EVERY DAY WITH FOOD OR MILK  . nitroGLYCERIN (NITROSTAT) 0.4 MG SL tablet Place 1 tablet (0.4 mg total) under the tongue every 5 (five) minutes as needed  for chest pain.  . NON FORMULARY Diet: Soft, NAS, cons CHO  . ondansetron (ZOFRAN ODT) 4 MG disintegrating tablet Take 1 tablet (4 mg total) by mouth every 8 (eight) hours as needed for nausea or vomiting.  . OXYGEN Inhale 3 L into the lungs continuous.  [name] [name] (SYSTANE) 0.4-0.3 % GEL ophthalmic gel Place 1 application into the left eye at bedtime.  . polyethylene glycol (MIRALAX / GLYCOLAX) 17 g packet Take 17 g by mouth daily as needed.  . potassium chloride SA (KLOR-CON) 20 MEQ tablet Take 40 mEq by mouth daily.  . pravastatin (PRAVACHOL) 40 MG tablet Take 1 tablet (40 mg total) by mouth daily.  Marland Kitchen senna (SENOKOT) 8.6 MG TABS tablet Take 2 tablets (17.2 mg total) by mouth daily as needed for mild constipation.  Marland Kitchen SYNTHROID 75 MCG tablet Take 1 tablet (75 mcg total) by mouth daily before breakfast.  . warfarin (COUMADIN) 5 MG tablet Take 2 tablets by mouth once a day on Sun, Mon, Wed, Thurs, Fri, Sat 5:00 PM  Take 1 tablet by mouth once a day on Tuesday 5:00 PM   Facility-Administered Encounter Medications as of 07/09/2020  Medication  . epoetin alfa (EPOGEN,PROCRIT) injection 40,000 Units     SIGNIFICANT DIAGNOSTIC EXAMS  TODAY  06-29-20 chest x-ray:  1. Stable cardiomegaly. 2. Unchanged interstitial coarsening, this is similar to multiple prior exams. Septal thickening on prior CT suggests an element of pulmonary edema. There is background emphysema on CT. 3. No new or acute airspace disease  07-03-20: ct of head:  1. No acute intracranial findings. 2. Mildly progressive atrophy and chronic small vessel ischemic changes. 3. Left sphenoid sinus disease.  07-06-20: EEG: This study showed evidence of epileptogenic city arising from left temporal region as well as mild diffuse encephalopathy, nonspecific etiology.  No seizures were seen throughout the recording.   07-08-20: chest x-ray:  Left basilar airspace disease could be due to atelectasis or  pneumonia, unchanged. Cardiomegaly without edema. Aortic Atherosclerosis  and Emphysema   LABS REVIEWED TODAY;  06-29-20: wbc 7.8; hgb 9.9 hct 32.0; mcv 94.4 plt 210; glucose 116; bun 59; creat 1.70; k+ 5.2; na++ 134; ca 9.1 liver normal albumin 3.8  07-01-20; wbc 5.8; hgb 8.8; hct 28.9 mcv 93.8 plt 203; glucose 81; bun 55; creat 1.45; k+ 4.0; na++ 138; ca 9.3 liver normal albumin 3.2 mag 2.0; phos 3.3 07-04-20: wbc 12.3; hgb 9.3; hct 29.1; mcv 90.9 plt 182 07-07-20: wbc 6.1; hgb 8.5; hct 28.0; mcv 95.2 plt 181; glucose 90; bun 63; creat 1.38; k+ 4.0; na++ 138; ca 8.6 mag 2.1; phos 3.8    Review of Systems  Constitutional: Negative for malaise/fatigue.  Respiratory: Negative for cough and shortness of breath.   Cardiovascular: Negative for chest pain, palpitations and leg swelling.  Gastrointestinal: Negative for abdominal pain, constipation and heartburn.  Musculoskeletal: Negative for back pain, joint pain and myalgias.  Skin: Negative.   Neurological: Negative for dizziness.  Psychiatric/Behavioral: The patient is not nervous/anxious.     Physical Exam Constitutional:      General: She is not in acute distress.    Appearance: She is well-developed. She is obese. She is not diaphoretic.  Neck:     Thyroid: No thyromegaly.  Cardiovascular:     Rate and Rhythm:  Normal rate and regular rhythm.     Pulses: Normal pulses.     Heart sounds: Normal heart sounds.     Comments: History of aortic and mitral valve replacements Pacemaker ICD placement  Pulmonary:     Effort: Pulmonary effort is normal. No respiratory distress.     Breath sounds: Normal breath sounds.  Abdominal:     General: Bowel sounds are normal. There is no distension.     Palpations: Abdomen is soft.     Tenderness: There is no abdominal tenderness.     Comments: 02  Musculoskeletal:        General: Normal range of motion.     Cervical back: Neck supple.     Right lower leg: Edema present.     Left lower leg:  Edema present.     Comments: Has mild lower extremity edema   Lymphadenopathy:     Cervical: No cervical adenopathy.  Skin:    General: Skin is warm and dry.     Comments: Bilateral lower extremities discolored   Neurological:     Mental Status: She is alert and oriented to person, place, and time.  Psychiatric:        Mood and Affect: Mood normal.        ASSESSMENT/ PLAN:  TODAY  1. Acute on chronic systolic congestive heart failure: is stable EF 30-35% (01-31-20): will continue lasix 40 mg daily toprol xl 100 mg daily has prn ntg  2. CHB (complete heart block) is status post pacemaker/icd will monitor  3. Persistent atrial fibrillation: heart rate is stable will continue toprol xl 100 mg daily for rate control and is on long term coumadin therapy.   4. Chronic bronchitis unspecified bronchitis type: is stable is 02 dependent; will continue flonase daily; breo ellipta 100/25 mcg 1 puff daily; albuterol neb twice daily and albuterol inhaler 1 puff every 6 hours as needed  5. Chronic anticoagulation on coumadin for mechanical AVR and MVR: INR 3.6 will continue long term coumadin therapy goal is 2.5-3.5.   6. Seizure: no reports of further seizure activity: will continue keppra 500 mg twice daily   7. CKD (chronic kidney disease) stage IV is stable bun 63; creat 1.77  8. Hypothyroidism postsurgical: is stable will continue synthroid 75 mcg daily   9. Hypertensive heart disease and chronic kidney disease with chronic systolic congestive heart failure and stage IV chronic kidney disease: is stable b/p 138/89 (recheck) will continue toprol xl 100 mg daily   10. Hyperlipidemia associated with type 2 diabetes mellitus: is stable will continue pravachol 40 mg daily   11. Type 2 diabetes mellitus with hypoglycemia without coma without long term use of insulin: is stable will continue to monitor her status.   12. 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 Chronic constipation: is stable will continue prn miralax and senna 2 tabs  14. Hypokalemia: is stable k+ 4.0 will continue k+ 40 meq daily   07-15-20 will check cbc; bmp INR    MD is aware of resident's narcotic use and is in agreement with current plan of care. We will attempt to wean resident as appropriate.  Ok Edwards NP [facility]  Contact [phone] Monday through Friday 8am- 5pm  After hours call [phone]

## 2020-07-09 NOTE — Discharge Instructions (Signed)
Please increase your furosemide (Lasix) to 80 mg (2 tablets) every morning for the next 3 days.  Then, resume your current dose of 40 mg (1 tablet) every morning.

## 2020-07-12 ENCOUNTER — Encounter: Payer: Self-pay | Admitting: Internal Medicine

## 2020-07-12 ENCOUNTER — Non-Acute Institutional Stay (SKILLED_NURSING_FACILITY): Payer: Medicare Other | Admitting: Internal Medicine

## 2020-07-12 DIAGNOSIS — I5023 Acute on chronic systolic (congestive) heart failure: Secondary | ICD-10-CM

## 2020-07-12 DIAGNOSIS — N184 Chronic kidney disease, stage 4 (severe): Secondary | ICD-10-CM

## 2020-07-12 DIAGNOSIS — Z7189 Other specified counseling: Secondary | ICD-10-CM | POA: Diagnosis not present

## 2020-07-12 DIAGNOSIS — R569 Unspecified convulsions: Secondary | ICD-10-CM

## 2020-07-12 NOTE — Assessment & Plan Note (Signed)
Current creatinine 1.83, GFR 25.  Avoid nephrotoxic drugs

## 2020-07-12 NOTE — Patient Instructions (Signed)
See assessment and plan under each diagnosis in the problem list and acutely for this visit 

## 2020-07-12 NOTE — Assessment & Plan Note (Signed)
Palliative care clinically indicated.  Patient remains DNR.

## 2020-07-12 NOTE — Assessment & Plan Note (Signed)
No seizure recurrence

## 2020-07-12 NOTE — Progress Notes (Signed)
NURSING HOME LOCATION:  [facility] ROOM NUMBER: 151/P   CODE STATUS:  DNR  PCP:  Fayrene Helper, MD  This is a comprehensive admission note to [facility] performed on this date less than 30 days from date of admission. Included are preadmission medical/surgical history; reconciled medication list; family history; social history and comprehensive review of systems.  Corrections and additions to the records were documented. Comprehensive physical exam was also performed. Additionally a clinical summary was entered for each active diagnosis pertinent to this admission in the Problem List to enhance continuity of care.  HPI: Patient was hospitalized 10/5-10/14/2021 presenting with worsening shortness of breath with clinical acute on chronic systolic CHF exacerbation with elevated BNP, radiographic pulmonary edema, and slight AKI.  Baseline creatinine was felt to be 1.4-1.7.  There was a good clinical response to IV diuresis.  IV Lasix was transitioned to oral Lasix.  She had an net I & O of -5.5 L during hospitalization. Discharge was planned on 10/9; but the patient had a witnessed seizure episode with resultant agonal breathing requiring rescue breathing as well as Ativan.  Post seizure resolution she exhibited hypotension which required ICU transfer and a brief course of IV Levophed. EEG revealed epileptogenic activity from the left temporal region as well as mild diffuse encephalopathy.  MRI could not be completed due to abandoned lead in place from prior pacemaker. She remained in persistent A. fib but rate was controlled.  Warfarin was continued with an INR goal of 2.0-3.0. Hydralazine and Imdur which have been maintenance medicines at home were discontinued due to hypotension following the seizure.  Maintenance Keppra dose was to be 500 mg twice daily. On 10/13 glucose of 60 was documented in the setting of decreased oral intake in the context of being edentulous and  having lost her dentures.  Diet was transitioned to soft diet with improved oral intake and resolution of the hypoglycemia. Palliative care as an outpatient was recommended as the patient is DNR.  Past medical and surgical history: Includes history of hypothyroidism, history of thyroid cancer, spinal stenosis of lumbar region, dyslipidemia, history of iron deficiency anemia, nonischemic cardiomyopathy, COPD, history congestive heart failure, history CHB, and diabetes with vascular complications. Surgeries and procedures include mechanical aortic and mitral valve replacement, resection of adenomatous polyp of the colon, multiple colonoscopies, partial thyroidectomy, and pacemaker placement.  Social history: Nondrinker; 30 pack year history of smoking.  Family history: Strong family history of heart disease.   Review of systems: Date given as "Monday, October, 20000-21". She denies any active symptoms with special reference to cardiopulmonary symptoms "at the moment".  She states that her breathing is "fairly good".  Constitutional: No fever, significant weight change, fatigue  Eyes: No redness, discharge, pain, vision change ENT/mouth: No nasal congestion, purulent discharge, earache, change in hearing, sore throat  Cardiovascular: No chest pain, palpitations, paroxysmal nocturnal dyspnea, claudication, edema  Respiratory: No cough, sputum production, hemoptysis, significant snoring, apnea  Gastrointestinal: No heartburn, dysphagia, abdominal pain, nausea /vomiting, rectal bleeding, melena, change in bowels Genitourinary: No dysuria, hematuria, pyuria, incontinence, nocturia Musculoskeletal: No joint stiffness, joint swelling, weakness, pain Dermatologic: No rash, pruritus, change in appearance of skin Neurologic: No dizziness, headache, syncope, seizures, numbness, tingling Psychiatric: No significant anxiety, depression, insomnia, anorexia Endocrine: No change in hair/skin/nails, excessive  thirst, excessive hunger, excessive urination  Hematologic/lymphatic: No significant bruising, lymphadenopathy, abnormal bleeding Allergy/immunology: No itchy/watery eyes, significant sneezing, urticaria, angioedema  Physical exam:  Pertinent or positive findings:  She is lethargic and in fact was asleep in the wheelchair when I entered the room.  Hair is disheveled.  Proptosis of the left eye is suggested but this may be due to venous varicosities in the upper lid.  She has ptosis on the right.  Arcus senilis is present.  She has only a few mandibular teeth.  She exhibits repetitive mastication activity of the mandible.  S2 was mechanical in nature and accentuated.  Rhythm is regular.  She has very low-grade rhonchi and rales on expiration.  There is trace edema at the sock line.  Pedal pulses are decreased.  The right upper extremity is weak to opposition; there is no opposition on the left.  She has fair strength to opposition with extension of the lower legs.  General appearance:  no acute distress, increased work of breathing is present.   Lymphatic: No lymphadenopathy about the head, neck, axilla. Eyes: No conjunctival inflammation or lid edema is present. There is no scleral icterus. Ears:  External ear exam shows no significant lesions or deformities.   Nose:  External nasal examination shows no deformity or inflammation. Nasal mucosa are pink and moist without lesions, exudates Oral exam: Lips and gums are healthy appearing.There is no oropharyngeal erythema or exudate. Neck:  No thyromegaly, masses, tenderness noted.    Heart:  No gallop, murmur, click, rub.  Lungs:  without wheezes, rubs. Abdomen: Bowel sounds are normal.  Abdomen is soft and nontender with no organomegaly, hernias, masses. GU: Deferred  Extremities:  No cyanosis, clubbing. Neurologic exam: Balance, Rhomberg, finger to nose testing could not be completed due to clinical state Skin: Warm & dry w/o tenting. No  significant lesions or rash.  See clinical summary under each active problem in the Problem List with associated updated therapeutic plan

## 2020-07-12 NOTE — Assessment & Plan Note (Signed)
Clinically compensated @ present. 

## 2020-07-13 ENCOUNTER — Other Ambulatory Visit (HOSPITAL_COMMUNITY)
Admission: RE | Admit: 2020-07-13 | Discharge: 2020-07-13 | Disposition: A | Discharge status: Home / Self Care | Payer: Medicare Other | Source: Skilled Nursing Facility | Attending: Adult Health | Admitting: Adult Health

## 2020-07-13 DIAGNOSIS — I4811 Longstanding persistent atrial fibrillation: Secondary | ICD-10-CM | POA: Diagnosis present

## 2020-07-13 LAB — PROTIME-INR
INR: 1.8 — ABNORMAL HIGH (ref 0.8–1.2)
Prothrombin Time: 20.3 seconds — ABNORMAL HIGH (ref 11.4–15.2)

## 2020-07-14 ENCOUNTER — Other Ambulatory Visit: Payer: Self-pay | Admitting: *Deleted

## 2020-07-14 NOTE — Patient Outreach (Signed)
Member screened for potential [facility] Care Management needs as a benefit of [employer].  Per Patient Ping member resides in [facility].   Communication sent to [facility] SW to collaborate about anticipated dc plans and potential [facility] Care Management needs.  Will continue to follow while member resides in SNF.   Donivan Thammavong, MSN-Ed, RN,BSN [facility] Post Acute Care Coordinator [phone] ( [employer]) [phone]  (Toll free office)  

## 2020-07-15 ENCOUNTER — Non-Acute Institutional Stay (SKILLED_NURSING_FACILITY): Payer: Medicare Other | Admitting: Adult Health

## 2020-07-15 ENCOUNTER — Encounter: Payer: Self-pay | Admitting: Adult Health

## 2020-07-15 ENCOUNTER — Other Ambulatory Visit: Payer: Self-pay | Admitting: *Deleted

## 2020-07-15 DIAGNOSIS — I4819 Other persistent atrial fibrillation: Secondary | ICD-10-CM

## 2020-07-15 DIAGNOSIS — E1169 Type 2 diabetes mellitus with other specified complication: Secondary | ICD-10-CM | POA: Insufficient documentation

## 2020-07-15 DIAGNOSIS — I130 Hypertensive heart and chronic kidney disease with heart failure and stage 1 through stage 4 chronic kidney disease, or unspecified chronic kidney disease: Secondary | ICD-10-CM | POA: Insufficient documentation

## 2020-07-15 DIAGNOSIS — N184 Chronic kidney disease, stage 4 (severe): Secondary | ICD-10-CM | POA: Insufficient documentation

## 2020-07-15 DIAGNOSIS — I442 Atrioventricular block, complete: Secondary | ICD-10-CM | POA: Diagnosis not present

## 2020-07-15 DIAGNOSIS — I5023 Acute on chronic systolic (congestive) heart failure: Secondary | ICD-10-CM

## 2020-07-15 DIAGNOSIS — E785 Hyperlipidemia, unspecified: Secondary | ICD-10-CM | POA: Insufficient documentation

## 2020-07-15 NOTE — Progress Notes (Signed)
Location:    [place] Room Number: 151/P Place of Service:  SNF (31)   CODE STATUS: DNR  Allergies  Allergen Reactions  . Penicillins Other (See Comments)    Bruise    . Aspirin Other (See Comments)    On coumadin  . Fentanyl Dermatitis    Rash with patch   . Niacin Itching    Chief Complaint  Patient presents with  . Short Term Rehab (STR)           Acute on chronic systolic congestive heart failure:  . CHB (complete heart block)   Persistent atrial fibrillation     Weekly follow up for the first 30 day post hospitalization.     HPI:  She is a 86 year old long term resident of this facility being seen for the management of her chronic illnesses:Acute on chronic systolic congestive heart failure:  . CHB (complete heart block)   Persistent atrial fibrillation. There are no reports of uncontrolled pain; no changes in her appetite; no reports anxiety or depressive thoughts. Denies any cough or shortness of breath.    Past Medical History:  Diagnosis Date  . Acute on chronic diastolic CHF (congestive heart failure) ( [place]) 05/19/2012  . Acute on chronic respiratory failure with hypoxia ([place]) 01/31/2020  . Adenomatous polyp of colon 12/16/2002   Dr. [name]/[facility]  . Allergy   . Calculus of gallbladder with chronic cholecystitis without obstruction 02/09/2009   Qualifier: Diagnosis of  By: [name], [name]    . Cataract   . CHB (complete heart block) ([place]) 10/07/2014      . CHF (congestive heart failure) ([place])    a.  EF previously reduced to 25-30% b. EF improved to 60-65% by echo in 10/2017.   Marland Kitchen Chronic kidney disease, stage I 2006  . Constipation       . COPD (chronic obstructive pulmonary disease) ([place])       . DJD (degenerative joint disease) of lumbar spine   . DM (diabetes mellitus) ([place]) 2006  . Esophageal reflux   . Glaucoma   . Gout       . H/O adenomatous polyp of colon 05/24/2012   2004 in [place] colonoscopy 2008 normal    . H/O heart valve replacement with mechanical valve    a. s/p mechanical AVR in 2001 and mechanical MVR in 2004.  Marland Kitchen H/O wisdom tooth extraction   . HIP PAIN, RIGHT 04/23/2009   Qualifier: Diagnosis of  By: [name], [name]    . Hyperlipemia   . IDA (iron deficiency anemia)    Parenteral iron/[name]  . Insomnia       . Nausea without vomiting 06/07/2010   Qualifier: Diagnosis of  By: [name], [name]    . Non-ischemic cardiomyopathy ([place])    a. s/p MDT CRTD  . Obesity, unspecified   . Oxygen deficiency 2014   nocturnal;  . Papilloma of breast 03/06/2012   This was diagnosed as a left breast papilloma by ultrasound characteristics and symptoms in 2010. Because of possible medical risk factors no surgical intervention has been done and it did doing annual followups. The area has been stable by physical examination and mammograms and ultrasound since 2010.   Marland Kitchen Spinal stenosis of lumbar region 02/19/2013  . Spondylolisthesis   . Spondylosis   . Thyroid cancer ([place])    remote thyroidectomy, no recurrence, pt denies in 2016 thyroid cancer  .  Type 2 diabetes mellitus with hyperglycemia ([place]) 07/15/2010  . Unspecified hypothyroidism       . 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 Vertigo         Past Surgical History:  Procedure Laterality Date  . AORTIC AND MITRAL VALVE REPLACEMENT  2001  . BI-VENTRICULAR IMPLANTABLE CARDIOVERTER DEFIBRILLATOR UPGRADE N/A 01/18/2015   Procedure: BI-VENTRICULAR IMPLANTABLE CARDIOVERTER DEFIBRILLATOR UPGRADE;  Surgeon: Evans Lance, MD;  Location: [facility] CATH LAB;  Service: Cardiovascular;  Laterality: N/A;  . BIV ICD GENERATOR CHANGEOUT N/A 01/30/2019   Procedure: BIV ICD GENERATOR CHANGEOUT;  Surgeon: Evans Lance, MD;  Location: [place] CV LAB;  Service: Cardiovascular;  Laterality: N/A;  . CARDIAC CATHETERIZATION    . CARDIAC VALVE REPLACEMENT    . CARDIOVERSION N/A 11/13/2016    Procedure: CARDIOVERSION;  Surgeon: Sanda Klein, MD;  Location: [facility] ENDOSCOPY;  Service: Cardiovascular;  Laterality: N/A;  . COLONOSCOPY  06/12/2007   [place]ourk- Normal rectum/Normal colon  . COLONOSCOPY  12/16/02   small hemorrhoids  . COLONOSCOPY  06/20/2012   Procedure: COLONOSCOPY;  Surgeon: Daneil Dolin, MD;  Location: [facility] ENDO SUITE;  Service: Endoscopy;  Laterality: N/A;  10:45  . defibrillator implanted 2006    . DOPPLER ECHOCARDIOGRAPHY  2012  . DOPPLER ECHOCARDIOGRAPHY  05,06,07,08,09,2011  . ESOPHAGOGASTRODUODENOSCOPY  06/12/2007   Rourk- normal esophagus, small hiatal hernia, otherwise normal stomach, D1, D2  . EYE SURGERY Right 2005  . EYE SURGERY Left 2006  . ICD LEAD REMOVAL Left 02/08/2015   Procedure: ICD LEAD REMOVAL;  Surgeon: Evans Lance, MD;  Location: [facility] OR;  Service: Cardiovascular;  Laterality: Left;  "Will plan extraction and insertion of a BiV PM"  **Dr. Roxan Hockey backing up case**  . IMPLANTABLE CARDIOVERTER DEFIBRILLATOR (ICD) GENERATOR CHANGE Left 02/08/2015   Procedure: ICD GENERATOR CHANGE;  Surgeon: Evans Lance, MD;  Location: [place];  Service: Cardiovascular;  Laterality: Left;  . INSERT / REPLACE / REMOVE PACEMAKER    . PACEMAKER INSERTION  May 2016  . pacemaker placed  2004  . right breast cyst removed     benign   . right cataract removed     2005  . TEE WITHOUT CARDIOVERSION N/A 11/13/2016   Procedure: TRANSESOPHAGEAL ECHOCARDIOGRAM (TEE);  Surgeon: Sanda Klein, MD;  Location: [facility] ENDOSCOPY;  Service: Cardiovascular;  Laterality: N/A;  . THYROIDECTOMY      Social History   Socioeconomic History  . Marital status: Widowed    Spouse name: Not on file  . Number of children: 0  . Years of education: Not on file  . Highest education level: Not on file  Occupational History  . Occupation: retired    Fish farm manager: RETIRED  Tobacco Use  . Smoking status: Former Smoker    Packs/day: 0.75    Years: 40.00    Pack years: 30.00    Types:  Cigarettes    Quit date: 03/08/1987    Years since quitting: 33.3  . Smokeless tobacco: Never Used  Vaping Use  . Vaping Use: Never used  Substance and Sexual Activity  . Alcohol use: No    Alcohol/week: 0.0 standard drinks  . Drug use: No  . Sexual activity: Not Currently  Other Topics Concern  . Not on file  Social History Narrative   From [place] (2004)   Social Determinants of Health   Financial Resource Strain: Low Risk   . Difficulty of Paying Living Expenses: Not hard at all  Food  Insecurity: No Food Insecurity  . Worried About Charity fundraiser in the Last Year: Never true  . Ran Out of Food in the Last Year: Never true  Transportation Needs: No Transportation Needs  . Lack of Transportation (Medical): No  . Lack of Transportation (Non-Medical): No  Physical Activity: Inactive  . Days of Exercise per Week: 0 days  . Minutes of Exercise per Session: 0 min  Stress: No Stress Concern Present  . Feeling of Stress : Only a little  Social Connections: Moderately Isolated  . Frequency of Communication with Friends and Family: More than three times a week  . Frequency of Social Gatherings with Friends and Family: Twice a week  . Attends Religious Services: More than 4 times per year  . Active Member of Clubs or Organizations: No  . Attends Archivist Meetings: Never  . Marital Status: Widowed  Intimate Partner Violence:   . Fear of Current or Ex-Partner: Not on file  . Emotionally Abused: Not on file  . Physically Abused: Not on file  . Sexually Abused: Not on file   Family History  Problem Relation Age of Onset  . Pancreatic cancer Mother   . Heart disease Father   . Heart disease Sister   . Heart attack Sister   . Heart disease Brother   . Diabetes Brother   . Heart disease Brother   . Diabetes Brother   . Hypertension Brother   . Lung cancer Brother       VITAL SIGNS BP 138/65   Pulse (!) 58   Temp 97.9 F (36.6 C)   Resp 20   Ht 5\' 7"   (1.702 m)   Wt 190 lb 11.2 oz (86.5 kg)   SpO2 95%   BMI 29.87 kg/m   Facility-Administered Encounter Medications as of 07/15/2020  Medication  . epoetin alfa (EPOGEN,PROCRIT) injection 40,000 Units   Outpatient Encounter Medications as of 07/15/2020  Medication Sig  . acetaminophen (TYLENOL) 500 MG tablet Take 1 tablet (500 mg total) by mouth every 6 (six) hours as needed (pain).  Marland Kitchen albuterol (PROVENTIL) (2.5 MG/3ML) 0.083% nebulizer solution Take 3 mLs (2.5 mg total) by nebulization in the morning and at bedtime. *May use additional one time if needed for shortness of breath  . albuterol (VENTOLIN HFA) 108 (90 Base) MCG/ACT inhaler Inhale 1 puff into the lungs every 6 (six) hours as needed for wheezing or shortness of breath.  . allopurinol (ZYLOPRIM) 100 MG tablet Take 1 tablet (100 mg total) by mouth in the morning.  Marland Kitchen BREO ELLIPTA 100-25 MCG/INH AEPB 1 TAKE 1 INHALATION BY MOUTH ONCE DAILY  . cholecalciferol (VITAMIN D3) 25 MCG (1000 UNIT) tablet Take 1 tablet (1,000 Units total) by mouth in the morning.  . fluticasone (FLONASE) 50 MCG/ACT nasal spray Place 2 sprays into both nostrils daily.  . folic acid (FOLVITE) 1 MG tablet TAKE 1 TABLET(1 MG) BY MOUTH DAILY  . furosemide (LASIX) 40 MG tablet Take 40 mg by mouth daily. Start 07/13/2020  . gabapentin (NEURONTIN) 300 MG capsule TAKE 1 CAPSULE(300 MG) BY MOUTH TWICE DAILY  . levETIRAcetam (KEPPRA) 500 MG tablet Take 1 tablet (500 mg total) by mouth 2 (two) times daily.  . Liniments (SALONPAS PAIN RELIEF PATCH EX) Apply 1 patch topically daily as needed (pain).   . metoprolol succinate (TOPROL-XL) 100 MG 24 hr tablet Take 1 tablet (100 mg total) by mouth daily. TAKE 1 TABLET BY MOUTH EVERY DAY WITH FOOD OR MILK  .  nitroGLYCERIN (NITROSTAT) 0.4 MG SL tablet Place 1 tablet (0.4 mg total) under the tongue every 5 (five) minutes as needed for chest pain.  . NON FORMULARY Diet: Soft, NAS, cons CHO  . ondansetron (ZOFRAN ODT) 4 MG  disintegrating tablet Take 1 tablet (4 mg total) by mouth every 8 (eight) hours as needed for nausea or vomiting.  . OXYGEN Inhale 3 L into the lungs continuous.  [name] [name] (SYSTANE) 0.4-0.3 % GEL ophthalmic gel Place 1 application into the left eye at bedtime.  . polyethylene glycol (MIRALAX / GLYCOLAX) 17 g packet Take 17 g by mouth daily as needed.  . potassium chloride SA (KLOR-CON) 20 MEQ tablet Take 20 mEq by mouth daily.  . pravastatin (PRAVACHOL) 40 MG tablet Take 1 tablet (40 mg total) by mouth daily.  Marland Kitchen senna (SENOKOT) 8.6 MG TABS tablet Take 2 tablets (17.2 mg total) by mouth daily as needed for mild constipation.  Marland Kitchen SYNTHROID 75 MCG tablet Take 1 tablet (75 mcg total) by mouth daily before breakfast.  . warfarin (COUMADIN) 5 MG tablet Take 10 mg by mouth every evening.   . [DISCONTINUED] meclizine (ANTIVERT) 12.5 MG tablet TAKE 1 TABLET(12.5 MG) BY MOUTH THREE TIMES DAILY AS NEEDED FOR DIZZINESS     SIGNIFICANT DIAGNOSTIC EXAMS  PREVIOUS  06-29-20 chest x-ray:  1. Stable cardiomegaly. 2. Unchanged interstitial coarsening, this is similar to multiple prior exams. Septal thickening on prior CT suggests an element of pulmonary edema. There is background emphysema on CT. 3. No new or acute airspace disease  07-03-20: ct of head:  1. No acute intracranial findings. 2. Mildly progressive atrophy and chronic small vessel ischemic changes. 3. Left sphenoid sinus disease.  07-06-20: EEG: This study showed evidence of epileptogenic city arising from left temporal region as well as mild diffuse encephalopathy, nonspecific etiology.  No seizures were seen throughout the recording.   07-08-20: chest x-ray:  Left basilar airspace disease could be due to atelectasis or pneumonia, unchanged. Cardiomegaly without edema. Aortic Atherosclerosis  and Emphysema   NO NEW EXAMS  LABS REVIEWED PREVIOUS  06-29-20: wbc 7.8; hgb 9.9 hct 32.0; mcv 94.4 plt 210; glucose 116;  bun 59; creat 1.70; k+ 5.2; na++ 134; ca 9.1 liver normal albumin 3.8  07-01-20; wbc 5.8; hgb 8.8; hct 28.9 mcv 93.8 plt 203; glucose 81; bun 55; creat 1.45; k+ 4.0; na++ 138; ca 9.3 liver normal albumin 3.2 mag 2.0; phos 3.3 07-04-20: wbc 12.3; hgb 9.3; hct 29.1; mcv 90.9 plt 182 07-07-20: wbc 6.1; hgb 8.5; hct 28.0; mcv 95.2 plt 181; glucose 90; bun 63; creat 1.38; k+ 4.0; na++ 138; ca 8.6 mag 2.1; phos 3.8   TODAY  07-08-20: wbc 7.2; hgb 9.2; hct 30.7; mcv 95.6 plt 190; glucose 121; bun 65; creat 1.83; k+ 4.2; na++ 137; ca 8.9 BNP 913.0; INR 3.2 07-09-20: INR 3.6 07-13-20: INR 1.8    Review of Systems  Constitutional: Negative for malaise/fatigue.  Respiratory: Negative for cough and shortness of breath.   Cardiovascular: Negative for chest pain, palpitations and leg swelling.  Gastrointestinal: Negative for abdominal pain, constipation and heartburn.  Musculoskeletal: Negative for back pain, joint pain and myalgias.  Skin: Negative.   Neurological: Negative for dizziness.  Psychiatric/Behavioral: The patient is not nervous/anxious.    .   Physical Exam Constitutional:      General: She is not in acute distress.    Appearance: She is well-developed. She is obese. She is not diaphoretic.  Neck:  Thyroid: No thyromegaly.  Cardiovascular:     Rate and Rhythm: Normal rate and regular rhythm.     Pulses: Normal pulses.     Heart sounds: Normal heart sounds.     Comments: History of aortic and mitral valve replacements Pacemaker ICD placement  Pulmonary:     Effort: Pulmonary effort is normal. No respiratory distress.     Breath sounds: Normal breath sounds.     Comments: 02 Abdominal:     General: Bowel sounds are normal. There is no distension.     Palpations: Abdomen is soft.     Tenderness: There is no abdominal tenderness.  Musculoskeletal:        General: Normal range of motion.     Cervical back: Neck supple.     Right lower leg: Edema present.     Left lower leg:  Edema present.     Comments: Has mild lower extremity edema    Lymphadenopathy:     Cervical: No cervical adenopathy.  Skin:    General: Skin is warm and dry.     Comments: Bilateral lower extremities discolored    Neurological:     Mental Status: She is alert and oriented to person, place, and time.  Psychiatric:        Mood and Affect: Mood normal.    ASSESSMENT/ PLAN:  TODAY  1. Acute on chronic systolic congestive heart failure: is stable EF 30-35% (01-31-20) will continue lasix 40 mg daily toprol xl 100 mg daily has prn ntg  2. CHB (complete heart block) is status post pacemaker/icd will monitor   3. Persistent atrial fibrillation heart rate is stable will continue toprol xl 100 mg daily for rate control and is on long term coumadin therapy.   PREVIOUS   4. Chronic bronchitis unspecified bronchitis type: is stable is 02 dependent; will continue flonase daily; breo ellipta 100/25 mcg 1 puff daily; albuterol neb twice daily and albuterol inhaler 1 puff every 6 hours as needed  5. Chronic anticoagulation on coumadin for mechanical AVR and MVR: INR 1.8  will continue long term coumadin at 10 mg daily  therapy goal is 2.5-3.5.   6. Seizure: no reports of further seizure activity: will continue keppra 500 mg twice daily   7. CKD (chronic kidney disease) stage IV is stable bun 65; creat 1.83  8. Hypothyroidism postsurgical: is stable will continue synthroid 75 mcg daily   9. Hypertensive heart disease and chronic kidney disease with chronic systolic congestive heart failure and stage IV chronic kidney disease: is stable b/p 138/65 will continue toprol xl 100 mg daily   10. Hyperlipidemia associated with type 2 diabetes mellitus: is stable will continue pravachol 40 mg daily   11. Type 2 diabetes mellitus with hypoglycemia without coma without long term use of insulin: is stable will continue to monitor her status.   12. Chronic gout due to renal impairment without tophus  unspecified site: is stable will continue allopurinol 100 mg daily   13. Chronic constipation: is stable will continue prn miralax and senna 2 tabs  14. Hypokalemia: is stable k+ 4.2 will continue k+ 40 meq daily     MD is aware of resident's narcotic use and is in agreement with current plan of care. We will attempt to wean resident as appropriate.  Ok Edwards NP [facility]  Contact [phone] Monday through Friday 8am- 5pm  After hours call [phone]

## 2020-07-16 ENCOUNTER — Non-Acute Institutional Stay (SKILLED_NURSING_FACILITY): Payer: Medicare Other | Admitting: Adult Health

## 2020-07-16 ENCOUNTER — Encounter: Payer: Self-pay | Admitting: Adult Health

## 2020-07-16 ENCOUNTER — Other Ambulatory Visit (HOSPITAL_COMMUNITY)
Admission: RE | Admit: 2020-07-16 | Discharge: 2020-07-16 | Disposition: A | Discharge status: Home / Self Care | Payer: Medicare Other | Source: Skilled Nursing Facility | Attending: Adult Health | Admitting: Adult Health

## 2020-07-16 DIAGNOSIS — I4819 Other persistent atrial fibrillation: Secondary | ICD-10-CM | POA: Diagnosis not present

## 2020-07-16 DIAGNOSIS — Z7901 Long term (current) use of anticoagulants: Secondary | ICD-10-CM | POA: Diagnosis not present

## 2020-07-16 DIAGNOSIS — Z952 Presence of prosthetic heart valve: Secondary | ICD-10-CM | POA: Diagnosis not present

## 2020-07-16 LAB — PROTIME-INR
INR: 1.3 — ABNORMAL HIGH (ref 0.8–1.2)
Prothrombin Time: 15.4 seconds — ABNORMAL HIGH (ref 11.4–15.2)

## 2020-07-16 NOTE — Progress Notes (Signed)
Location:    [facility] Room Number: 151-P Place of Service:  SNF (31)   CODE STATUS: DNR  Allergies  Allergen Reactions  . Penicillins Other (See Comments)    Bruise    . Aspirin Other (See Comments)    On coumadin  . Fentanyl Dermatitis    Rash with patch   . Niacin Itching    Chief Complaint  Patient presents with  . Anticoagulation    PT/INR check     HPI:  Her INR today is 1.3 and is taking 10 mg coumadin daily. There are no reports of missed doses present. There are no reports of abnormal bleeding. Her INR goal is 2.5-3.5.  She denies any chest pain or palpitations.   Past Medical History:  Diagnosis Date  . Acute on chronic diastolic CHF (congestive heart failure) ([place]) 05/19/2012  . Acute on chronic respiratory failure with hypoxia ([place]) 01/31/2020  . Adenomatous polyp of colon 12/16/2002   Dr. [name]/[facility]  . Allergy   . Calculus of gallbladder with chronic cholecystitis without obstruction 02/09/2009   Qualifier: Diagnosis of  By: [name], [name]    . Cataract   . CHB (complete heart block) ([place]) 10/07/2014      . CHF (congestive heart failure) ([place])    a.  EF previously reduced to 25-30% b. EF improved to 60-65% by echo in 10/2017.   Marland Kitchen Chronic kidney disease, stage I 2006  . Constipation       . COPD (chronic obstructive pulmonary disease) ([place])       . DJD (degenerative joint disease) of lumbar spine   . DM (diabetes mellitus) ([place]) 2006  . Esophageal reflux   . Glaucoma   . Gout       . H/O adenomatous polyp of colon 05/24/2012   2004 in [place] colonoscopy 2008 normal    . H/O heart valve replacement with mechanical valve    a. s/p mechanical AVR in 2001 and mechanical MVR in 2004.  Marland Kitchen H/O wisdom tooth extraction   . HIP PAIN, RIGHT 04/23/2009   Qualifier: Diagnosis of  By: [name], [name]    . Hyperlipemia   . IDA (iron deficiency anemia)    Parenteral iron/Dr.  Tressie Stalker  . Insomnia       . Nausea without vomiting 06/07/2010   Qualifier: Diagnosis of  By: [name], [name]    . Non-ischemic cardiomyopathy ([place])    a. s/p MDT CRTD  . Obesity, unspecified   . Oxygen deficiency 2014   nocturnal;  . Papilloma of breast 03/06/2012   This was diagnosed as a left breast papilloma by ultrasound characteristics and symptoms in 2010. Because of possible medical risk factors no surgical intervention has been done and it did doing annual followups. The area has been stable by physical examination and mammograms and ultrasound since 2010.   Marland Kitchen Spinal stenosis of lumbar region 02/19/2013  . Spondylolisthesis   . Spondylosis   . Thyroid cancer ([place])    remote thyroidectomy, no recurrence, pt denies in 2016 thyroid cancer  . Type 2 diabetes mellitus with hyperglycemia ([place]) 07/15/2010  . Unspecified hypothyroidism       . 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 Vertigo         Past Surgical History:  Procedure Laterality Date  . AORTIC AND MITRAL VALVE REPLACEMENT  2001  . BI-VENTRICULAR IMPLANTABLE CARDIOVERTER DEFIBRILLATOR UPGRADE N/A  01/18/2015   Procedure: BI-VENTRICULAR IMPLANTABLE CARDIOVERTER DEFIBRILLATOR UPGRADE;  Surgeon: Evans Lance, MD;  Location: [facility] CATH LAB;  Service: Cardiovascular;  Laterality: N/A;  . BIV ICD GENERATOR CHANGEOUT N/A 01/30/2019   Procedure: BIV ICD GENERATOR CHANGEOUT;  Surgeon: Evans Lance, MD;  Location: [facility] CV LAB;  Service: Cardiovascular;  Laterality: N/A;  . CARDIAC CATHETERIZATION    . CARDIAC VALVE REPLACEMENT    . CARDIOVERSION N/A 11/13/2016   Procedure: CARDIOVERSION;  Surgeon: Sanda Klein, MD;  Location: [facility] ENDOSCOPY;  Service: Cardiovascular;  Laterality: N/A;  . COLONOSCOPY  06/12/2007   Rourk- Normal rectum/Normal colon  . COLONOSCOPY  12/16/02   small hemorrhoids  . COLONOSCOPY  06/20/2012   Procedure: COLONOSCOPY;  Surgeon: Daneil Dolin, MD;  Location: [facility] ENDO  SUITE;  Service: Endoscopy;  Laterality: N/A;  10:45  . defibrillator implanted 2006    . DOPPLER ECHOCARDIOGRAPHY  2012  . DOPPLER ECHOCARDIOGRAPHY  05,06,07,08,09,2011  . ESOPHAGOGASTRODUODENOSCOPY  06/12/2007   Rourk- normal esophagus, small hiatal hernia, otherwise normal stomach, D1, D2  . EYE SURGERY Right 2005  . EYE SURGERY Left 2006  . ICD LEAD REMOVAL Left 02/08/2015   Procedure: ICD LEAD REMOVAL;  Surgeon: Evans Lance, MD;  Location: [facility] OR;  Service: Cardiovascular;  Laterality: Left;  "Will plan extraction and insertion of a BiV PM"  **Dr. Roxan Hockey backing up case**  . IMPLANTABLE CARDIOVERTER DEFIBRILLATOR (ICD) GENERATOR CHANGE Left 02/08/2015   Procedure: ICD GENERATOR CHANGE;  Surgeon: Evans Lance, MD;  Location: [facility];  Service: Cardiovascular;  Laterality: Left;  . INSERT / REPLACE / REMOVE PACEMAKER    . PACEMAKER INSERTION  May 2016  . pacemaker placed  2004  . right breast cyst removed     benign   . right cataract removed     2005  . TEE WITHOUT CARDIOVERSION N/A 11/13/2016   Procedure: TRANSESOPHAGEAL ECHOCARDIOGRAM (TEE);  Surgeon: Sanda Klein, MD;  Location: [facility] ENDOSCOPY;  Service: Cardiovascular;  Laterality: N/A;  . THYROIDECTOMY      Social History   Socioeconomic History  . Marital status: Widowed    Spouse name: Not on file  . Number of children: 0  . Years of education: Not on file  . Highest education level: Not on file  Occupational History  . Occupation: retired    Fish farm manager: RETIRED  Tobacco Use  . Smoking status: Former Smoker    Packs/day: 0.75    Years: 40.00    Pack years: 30.00    Types: Cigarettes    Quit date: 03/08/1987    Years since quitting: 33.3  . Smokeless tobacco: Never Used  Vaping Use  . Vaping Use: Never used  Substance and Sexual Activity  . Alcohol use: No    Alcohol/week: 0.0 standard drinks  . Drug use: No  . Sexual activity: Not Currently  Other Topics Concern  . Not on file  Social History  Narrative   From [place] (2004)   Social Determinants of Health   Financial Resource Strain: Low Risk   . Difficulty of Paying Living Expenses: Not hard at all  Food Insecurity: No Food Insecurity  . Worried About Charity fundraiser in the Last Year: Never true  . Ran Out of Food in the Last Year: Never true  Transportation Needs: No Transportation Needs  . Lack of Transportation (Medical): No  . Lack of Transportation (Non-Medical): No  Physical Activity: Inactive  . Days of Exercise per Week: 0 days  .  Minutes of Exercise per Session: 0 min  Stress: No Stress Concern Present  . Feeling of Stress : Only a little  Social Connections: Moderately Isolated  . Frequency of Communication with Friends and Family: More than three times a week  . Frequency of Social Gatherings with Friends and Family: Twice a week  . Attends Religious Services: More than 4 times per year  . Active Member of Clubs or Organizations: No  . Attends Archivist Meetings: Never  . Marital Status: Widowed  Intimate Partner Violence:   . Fear of Current or Ex-Partner: Not on file  . Emotionally Abused: Not on file  . Physically Abused: Not on file  . Sexually Abused: Not on file   Family History  Problem Relation Age of Onset  . Pancreatic cancer Mother   . Heart disease Father   . Heart disease Sister   . Heart attack Sister   . Heart disease Brother   . Diabetes Brother   . Heart disease Brother   . Diabetes Brother   . Hypertension Brother   . Lung cancer Brother       VITAL SIGNS BP 129/63   Pulse 63   Temp 98.2 F (36.8 C)   Resp 20   Ht 5\' 7"  (1.702 m)   Wt 190 lb 11.2 oz (86.5 kg)   SpO2 96%   BMI 29.87 kg/m   Facility-Administered Encounter Medications as of 07/16/2020  Medication  . epoetin alfa (EPOGEN,PROCRIT) injection 40,000 Units   Outpatient Encounter Medications as of 07/16/2020  Medication Sig  . acetaminophen (TYLENOL) 500 MG tablet Take 1 tablet (500 mg  total) by mouth every 6 (six) hours as needed (pain).  Marland Kitchen albuterol (PROVENTIL) (2.5 MG/3ML) 0.083% nebulizer solution Take 3 mLs (2.5 mg total) by nebulization in the morning and at bedtime. *May use additional one time if needed for shortness of breath  . albuterol (VENTOLIN HFA) 108 (90 Base) MCG/ACT inhaler Inhale 1 puff into the lungs every 6 (six) hours as needed for wheezing or shortness of breath.  . allopurinol (ZYLOPRIM) 100 MG tablet Take 1 tablet (100 mg total) by mouth in the morning.  Marland Kitchen BREO ELLIPTA 100-25 MCG/INH AEPB 1 TAKE 1 INHALATION BY MOUTH ONCE DAILY  . cholecalciferol (VITAMIN D3) 25 MCG (1000 UNIT) tablet Take 1 tablet (1,000 Units total) by mouth in the morning.  . fluticasone (FLONASE) 50 MCG/ACT nasal spray Place 2 sprays into both nostrils daily.  . folic acid (FOLVITE) 1 MG tablet TAKE 1 TABLET(1 MG) BY MOUTH DAILY  . furosemide (LASIX) 40 MG tablet Take 40 mg by mouth daily. Start 07/13/2020  . gabapentin (NEURONTIN) 300 MG capsule TAKE 1 CAPSULE(300 MG) BY MOUTH TWICE DAILY  . levETIRAcetam (KEPPRA) 500 MG tablet Take 1 tablet (500 mg total) by mouth 2 (two) times daily.  . Liniments (SALONPAS PAIN RELIEF PATCH EX) Apply 1 patch topically daily as needed (pain).   . metoprolol succinate (TOPROL-XL) 100 MG 24 hr tablet Take 1 tablet (100 mg total) by mouth daily. TAKE 1 TABLET BY MOUTH EVERY DAY WITH FOOD OR MILK  . nitroGLYCERIN (NITROSTAT) 0.4 MG SL tablet Place 1 tablet (0.4 mg total) under the tongue every 5 (five) minutes as needed for chest pain.  . NON FORMULARY Diet: Soft, NAS, cons CHO  . ondansetron (ZOFRAN ODT) 4 MG disintegrating tablet Take 1 tablet (4 mg total) by mouth every 8 (eight) hours as needed for nausea or vomiting.  . OXYGEN Inhale  3 L into the lungs continuous.  [name] [name] (SYSTANE) 0.4-0.3 % GEL ophthalmic gel Place 1 application into the left eye at bedtime.  . polyethylene glycol (MIRALAX / GLYCOLAX) 17 g packet Take 17  g by mouth daily as needed.  . potassium chloride SA (KLOR-CON) 20 MEQ tablet Take 20 mEq by mouth daily.  . pravastatin (PRAVACHOL) 40 MG tablet Take 1 tablet (40 mg total) by mouth daily.  Marland Kitchen senna (SENOKOT) 8.6 MG TABS tablet Take 2 tablets (17.2 mg total) by mouth daily as needed for mild constipation.  Marland Kitchen SYNTHROID 75 MCG tablet Take 1 tablet (75 mcg total) by mouth daily before breakfast.  . warfarin (COUMADIN) 7.5 MG tablet Take 7.5 mg by mouth daily. At 5:00 pm  . [DISCONTINUED] warfarin (COUMADIN) 5 MG tablet Take 10 mg by mouth every evening.      SIGNIFICANT DIAGNOSTIC EXAMS   PREVIOUS  06-29-20 chest x-ray:  1. Stable cardiomegaly. 2. Unchanged interstitial coarsening, this is similar to multiple prior exams. Septal thickening on prior CT suggests an element of pulmonary edema. There is background emphysema on CT. 3. No new or acute airspace disease  07-03-20: ct of head:  1. No acute intracranial findings. 2. Mildly progressive atrophy and chronic small vessel ischemic changes. 3. Left sphenoid sinus disease.  07-06-20: EEG: This study showed evidence of epileptogenic city arising from left temporal region as well as mild diffuse encephalopathy, nonspecific etiology.  No seizures were seen throughout the recording.   07-08-20: chest x-ray:  Left basilar airspace disease could be due to atelectasis or pneumonia, unchanged. Cardiomegaly without edema. Aortic Atherosclerosis  and Emphysema   NO NEW EXAMS  LABS REVIEWED PREVIOUS  06-29-20: wbc 7.8; hgb 9.9 hct 32.0; mcv 94.4 plt 210; glucose 116; bun 59; creat 1.70; k+ 5.2; na++ 134; ca 9.1 liver normal albumin 3.8  07-01-20; wbc 5.8; hgb 8.8; hct 28.9 mcv 93.8 plt 203; glucose 81; bun 55; creat 1.45; k+ 4.0; na++ 138; ca 9.3 liver normal albumin 3.2 mag 2.0; phos 3.3 07-04-20: wbc 12.3; hgb 9.3; hct 29.1; mcv 90.9 plt 182 07-07-20: wbc 6.1; hgb 8.5; hct 28.0; mcv 95.2 plt 181; glucose 90; bun 63; creat 1.38; k+ 4.0; na++  138; ca 8.6 mag 2.1; phos 3.8  07-08-20: wbc 7.2; hgb 9.2; hct 30.7; mcv 95.6 plt 190; glucose 121; bun 65; creat 1.83; k+ 4.2; na++ 137; ca 8.9 BNP 913.0; INR 3.2 07-09-20: INR 3.6 07-13-20: INR 1.8   TODAY  07-16-20; INR 1.3 on 10 mg daily   Review of Systems  Constitutional: Negative for malaise/fatigue.  Respiratory: Negative for cough and shortness of breath.   Cardiovascular: Negative for chest pain, palpitations and leg swelling.  Gastrointestinal: Negative for abdominal pain, constipation and heartburn.  Musculoskeletal: Negative for back pain, joint pain and myalgias.  Skin: Negative.   Neurological: Negative for dizziness.  Psychiatric/Behavioral: The patient is not nervous/anxious.     Physical Exam Constitutional:      General: She is not in acute distress.    Appearance: She is well-developed. She is not diaphoretic.  Neck:     Thyroid: No thyromegaly.  Cardiovascular:     Rate and Rhythm: Normal rate and regular rhythm.     Heart sounds: Normal heart sounds.     Comments:  History of aortic and mitral valve replacements Pacemaker ICD placement  Pulmonary:     Effort: Pulmonary effort is normal. No respiratory distress.     Breath sounds: Normal  breath sounds.     Comments: 02 Abdominal:     General: Bowel sounds are normal. There is no distension.     Palpations: Abdomen is soft.     Tenderness: There is no abdominal tenderness.  Musculoskeletal:        General: Normal range of motion.     Cervical back: Neck supple.     Right lower leg: Edema present.     Left lower leg: Edema present.     Comments: Has mild lower extremity edema  Lymphadenopathy:     Cervical: No cervical adenopathy.  Skin:    General: Skin is warm and dry.     Comments: Bilateral lower extremities discolored     Neurological:     Mental Status: She is alert and oriented to person, place, and time.  Psychiatric:        Mood and Affect: Mood normal.        ASSESSMENT/  PLAN:  TODAY  1. Persistent atrial fibrillation 2. Status post MVR 3. Status post AVR 4. Chronic anticoagulation   Is worse  Will give her 15 mg for 2 days then return to 10 mg daily  Will check INR 07-19-20  MD is aware of resident's narcotic use and is in agreement with current plan of care. We will attempt to wean resident as appropriate.  Ok Edwards NP [facility]  Contact [phone] Monday through Friday 8am- 5pm  After hours call [phone]

## 2020-07-16 NOTE — Patient Outreach (Signed)
[facility] Coordinator follow up. Member screened for potential [facility] Care Management needs as a benefit of [place] [employer].  Previously received update from [place] indicating transition plan is to return home with supportive family.   Will continue to follow for potential [facility] Care Management needs while member resides in SNF.    Marthenia Rolling, MSN-Ed, RN,BSN [place] Acute Care Coordinator [phone] [facility]) [phone]  (Toll free office)

## 2020-07-19 ENCOUNTER — Encounter: Payer: Self-pay | Admitting: Adult Health

## 2020-07-19 ENCOUNTER — Encounter (HOSPITAL_COMMUNITY)
Admission: RE | Admit: 2020-07-19 | Discharge: 2020-07-19 | Disposition: A | Discharge status: Home / Self Care | Payer: Medicare Other | Source: Skilled Nursing Facility | Attending: Adult Health | Admitting: Adult Health

## 2020-07-19 ENCOUNTER — Non-Acute Institutional Stay (SKILLED_NURSING_FACILITY): Payer: Medicare Other | Admitting: Adult Health

## 2020-07-19 DIAGNOSIS — I4819 Other persistent atrial fibrillation: Secondary | ICD-10-CM | POA: Diagnosis not present

## 2020-07-19 DIAGNOSIS — Z7901 Long term (current) use of anticoagulants: Secondary | ICD-10-CM

## 2020-07-19 DIAGNOSIS — Z952 Presence of prosthetic heart valve: Secondary | ICD-10-CM | POA: Diagnosis not present

## 2020-07-19 LAB — PROTIME-INR
INR: 1.4 — ABNORMAL HIGH (ref 0.8–1.2)
Prothrombin Time: 17 seconds — ABNORMAL HIGH (ref 11.4–15.2)

## 2020-07-19 NOTE — Progress Notes (Signed)
Location:    [facility] Room Number: 151/P Place of Service:  SNF (31)   CODE STATUS: DNR  Allergies  Allergen Reactions  . Penicillins Other (See Comments)    Bruise    . Aspirin Other (See Comments)    On coumadin  . Fentanyl Dermatitis    Rash with patch   . Niacin Itching    Chief Complaint  Patient presents with  . Anticoagulation    INR Visit    HPI:  Her INR today is 1.4 and she is taking coumadin 10 mg daily. Her INR goal is 2.5-3.5.  There are no reports of missed doses. There are no reports of chest pain; palpitations or shortness of breath. There are no reports of bleeding present.   Past Medical History:  Diagnosis Date  . Acute on chronic diastolic CHF (congestive heart failure) ([place]) 05/19/2012  . Acute on chronic respiratory failure with hypoxia ([place]) 01/31/2020  . Adenomatous polyp of colon 12/16/2002   Dr. [name]/[facility]  . Allergy   . Calculus of gallbladder with chronic cholecystitis without obstruction 02/09/2009   Qualifier: Diagnosis of  By: [name], [name]    . Cataract   . CHB (complete heart block) ([place]) 10/07/2014      . CHF (congestive heart failure) ([place])    a.  EF previously reduced to 25-30% b. EF improved to 60-65% by echo in 10/2017.   Marland Kitchen Chronic kidney disease, stage I 2006  . Constipation       . COPD (chronic obstructive pulmonary disease) ([place])       . DJD (degenerative joint disease) of lumbar spine   . DM (diabetes mellitus) ([place]) 2006  . Esophageal reflux   . Glaucoma   . Gout       . H/O adenomatous polyp of colon 05/24/2012   2004 in [place] colonoscopy 2008 normal    . H/O heart valve replacement with mechanical valve    a. s/p mechanical AVR in 2001 and mechanical MVR in 2004.  Marland Kitchen H/O wisdom tooth extraction   . HIP PAIN, RIGHT 04/23/2009   Qualifier: Diagnosis of  By: [name], [name]    . Hyperlipemia   . IDA (iron deficiency anemia)     Parenteral iron/[name]  . Insomnia       . Nausea without vomiting 06/07/2010   Qualifier: Diagnosis of  By: [name], [name]    . Non-ischemic cardiomyopathy ([place])    a. s/p MDT CRTD  . Obesity, unspecified   . Oxygen deficiency 2014   nocturnal;  . Papilloma of breast 03/06/2012   This was diagnosed as a left breast papilloma by ultrasound characteristics and symptoms in 2010. Because of possible medical risk factors no surgical intervention has been done and it did doing annual followups. The area has been stable by physical examination and mammograms and ultrasound since 2010.   Marland Kitchen Spinal stenosis of lumbar region 02/19/2013  . Spondylolisthesis   . Spondylosis   . Thyroid cancer ([place])    remote thyroidectomy, no recurrence, pt denies in 2016 thyroid cancer  . Type 2 diabetes mellitus with hyperglycemia ([place]) 07/15/2010  . Unspecified hypothyroidism       . 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 Vertigo         Past Surgical History:  Procedure Laterality Date  . AORTIC AND MITRAL VALVE REPLACEMENT  2001  . BI-VENTRICULAR IMPLANTABLE  CARDIOVERTER DEFIBRILLATOR UPGRADE N/A 01/18/2015   Procedure: BI-VENTRICULAR IMPLANTABLE CARDIOVERTER DEFIBRILLATOR UPGRADE;  Surgeon: Evans Lance, MD;  Location: [facility] C[facility] LAB;  Service: Cardiovascular;  Laterality: N/A;  . BIV ICD GENERATOR CHANGEOUT N/A 01/30/2019   Procedure: BIV ICD GENERATOR CHANGEOUT;  Surgeon: Evans Lance, MD;  Location: [facility] CV LAB;  Service: Cardiovascular;  Laterality: N/A;  . CARDIAC CATHETERIZATION    . CARDIAC VALVE REPLACEMENT    . CARDIOVERSION N/A 11/13/2016   Procedure: CARDIOVERSION;  Surgeon: Sanda Klein, MD;  Location: [facility] ENDOSCOPY;  Service: Cardiovascular;  Laterality: N/A;  . COLONOSCOPY  06/12/2007   [place]k- Normal rectum/Normal colon  . COLONOSCOPY  12/16/02   small hemorrhoids  . COLONOSCOPY  06/20/2012   Procedure: COLONOSCOPY;  Surgeon: Daneil Dolin, MD;   Location: [facility] ENDO SUITE;  Service: Endoscopy;  Laterality: N/A;  10:45  . defibrillator implanted 2006    . DOPPLER ECHOCARDIOGRAPHY  2012  . DOPPLER ECHOCARDIOGRAPHY  05,06,07,08,09,2011  . ESOPHAGOGASTRODUODENOSCOPY  06/12/2007   [place]ourk- normal esophagus, small hiatal hernia, otherwise normal stomach, D1, D2  . EYE SURGERY Right 2005  . EYE SURGERY Left 2006  . ICD LEAD REMOVAL Left 02/08/2015   Procedure: ICD LEAD REMOVAL;  Surgeon: Evans Lance, MD;  Location: [facility] OR;  Service: Cardiovascular;  Laterality: Left;  "Will plan extraction and insertion of a BiV PM"  **Dr. Roxan Hockey backing up case**  . IMPLANTABLE CARDIOVERTER DEFIBRILLATOR (ICD) GENERATOR CHANGE Left 02/08/2015   Procedure: ICD GENERATOR CHANGE;  Surgeon: Evans Lance, MD;  Location: [facility];  Service: Cardiovascular;  Laterality: Left;  . INSERT / REPLACE / REMOVE PACEMAKER    . PACEMAKER INSERTION  May 2016  . pacemaker placed  2004  . right breast cyst removed     benign   . right cataract removed     2005  . TEE WITHOUT CARDIOVERSION N/A 11/13/2016   Procedure: TRANSESOPHAGEAL ECHOCARDIOGRAM (TEE);  Surgeon: Sanda Klein, MD;  Location: [facility] ENDOSCOPY;  Service: Cardiovascular;  Laterality: N/A;  . THYROIDECTOMY      Social History   Socioeconomic History  . Marital status: Widowed    Spouse name: Not on file  . Number of children: 0  . Years of education: Not on file  . Highest education level: Not on file  Occupational History  . Occupation: retired    Fish farm manager: RETIRED  Tobacco Use  . Smoking status: Former Smoker    Packs/day: 0.75    Years: 40.00    Pack years: 30.00    Types: Cigarettes    Quit date: 03/08/1987    Years since quitting: 33.3  . Smokeless tobacco: Never Used  Vaping Use  . Vaping Use: Never used  Substance and Sexual Activity  . Alcohol use: No    Alcohol/week: 0.0 standard drinks  . Drug use: No  . Sexual activity: Not Currently  Other Topics Concern  . Not on file    Social History Narrative   From [place] (2004)   Social Determinants of Health   Financial Resource Strain: Low Risk   . Difficulty of Paying Living Expenses: Not hard at all  Food Insecurity: No Food Insecurity  . Worried About Charity fundraiser in the Last Year: Never true  . Ran Out of Food in the Last Year: Never true  Transportation Needs: No Transportation Needs  . Lack of Transportation (Medical): No  . Lack of Transportation (Non-Medical): No  Physical Activity: Inactive  . Days of  Exercise per Week: 0 days  . Minutes of Exercise per Session: 0 min  Stress: No Stress Concern Present  . Feeling of Stress : Only a little  Social Connections: Moderately Isolated  . Frequency of Communication with Friends and Family: More than three times a week  . Frequency of Social Gatherings with Friends and Family: Twice a week  . Attends Religious Services: More than 4 times per year  . Active Member of Clubs or Organizations: No  . Attends Archivist Meetings: Never  . Marital Status: Widowed  Intimate Partner Violence:   . Fear of Current or Ex-Partner: Not on file  . Emotionally Abused: Not on file  . Physically Abused: Not on file  . Sexually Abused: Not on file   Family History  Problem Relation Age of Onset  . Pancreatic cancer Mother   . Heart disease Father   . Heart disease Sister   . Heart attack Sister   . Heart disease Brother   . Diabetes Brother   . Heart disease Brother   . Diabetes Brother   . Hypertension Brother   . Lung cancer Brother       VITAL SIGNS BP (!) 181/96   Pulse 60   Temp 98.4 F (36.9 C)   Resp 20   Ht 5\' 7"  (1.702 m)   Wt 187 lb 8 oz (85 kg)   SpO2 96%   BMI 29.37 kg/m   Facility-Administered Encounter Medications as of 07/19/2020  Medication  . epoetin alfa (EPOGEN,PROCRIT) injection 40,000 Units   Outpatient Encounter Medications as of 07/19/2020  Medication Sig  . acetaminophen (TYLENOL) 500 MG tablet Take 1  tablet (500 mg total) by mouth every 6 (six) hours as needed (pain).  Marland Kitchen albuterol (PROVENTIL) (2.5 MG/3ML) 0.083% nebulizer solution Take 3 mLs (2.5 mg total) by nebulization in the morning and at bedtime. *May use additional one time if needed for shortness of breath  . albuterol (VENTOLIN HFA) 108 (90 Base) MCG/ACT inhaler Inhale 1 puff into the lungs every 6 (six) hours as needed for wheezing or shortness of breath.  . allopurinol (ZYLOPRIM) 100 MG tablet Take 1 tablet (100 mg total) by mouth in the morning.  Marland Kitchen BREO ELLIPTA 100-25 MCG/INH AEPB 1 TAKE 1 INHALATION BY MOUTH ONCE DAILY  . cholecalciferol (VITAMIN D3) 25 MCG (1000 UNIT) tablet Take 1 tablet (1,000 Units total) by mouth in the morning.  . fluticasone (FLONASE) 50 MCG/ACT nasal spray Place 2 sprays into both nostrils daily.  . folic acid (FOLVITE) 1 MG tablet TAKE 1 TABLET(1 MG) BY MOUTH DAILY  . furosemide (LASIX) 40 MG tablet Take 40 mg by mouth daily. Start 07/13/2020  . gabapentin (NEURONTIN) 300 MG capsule TAKE 1 CAPSULE(300 MG) BY MOUTH TWICE DAILY  . levETIRAcetam (KEPPRA) 500 MG tablet Take 1 tablet (500 mg total) by mouth 2 (two) times daily.  . Liniments (SALONPAS PAIN RELIEF PATCH EX) Apply 1 patch topically daily as needed (pain).   . metoprolol succinate (TOPROL-XL) 100 MG 24 hr tablet Take 1 tablet (100 mg total) by mouth daily. TAKE 1 TABLET BY MOUTH EVERY DAY WITH FOOD OR MILK  . nitroGLYCERIN (NITROSTAT) 0.4 MG SL tablet Place 1 tablet (0.4 mg total) under the tongue every 5 (five) minutes as needed for chest pain.  . NON FORMULARY Diet: Soft, NAS, cons CHO  . ondansetron (ZOFRAN ODT) 4 MG disintegrating tablet Take 1 tablet (4 mg total) by mouth every 8 (eight) hours as needed  for nausea or vomiting.  . OXYGEN Inhale 3 L into the lungs continuous.  [name] [name] (SYSTANE) 0.4-0.3 % GEL ophthalmic gel Place 1 application into the left eye at bedtime.  . polyethylene glycol (MIRALAX / GLYCOLAX) 17 g  packet Take 17 g by mouth daily as needed.  . potassium chloride SA (KLOR-CON) 20 MEQ tablet Take 20 mEq by mouth daily.  . pravastatin (PRAVACHOL) 40 MG tablet Take 1 tablet (40 mg total) by mouth daily.  Marland Kitchen senna (SENOKOT) 8.6 MG TABS tablet Take 2 tablets (17.2 mg total) by mouth daily as needed for mild constipation.  Marland Kitchen SYNTHROID 75 MCG tablet Take 1 tablet (75 mcg total) by mouth daily before breakfast.  . warfarin (COUMADIN) 10 MG tablet Take 10 mg by mouth daily. Special Instructions: INR goal 2.5-3.5 due to valve replacements  . [DISCONTINUED] warfarin (COUMADIN) 7.5 MG tablet Take 7.5 mg by mouth daily. At 5:00 pm     SIGNIFICANT DIAGNOSTIC EXAMS   PREVIOUS  06-29-20 chest x-ray:  1. Stable cardiomegaly. 2. Unchanged interstitial coarsening, this is similar to multiple prior exams. Septal thickening on prior CT suggests an element of pulmonary edema. There is background emphysema on CT. 3. No new or acute airspace disease  07-03-20: ct of head:  1. No acute intracranial findings. 2. Mildly progressive atrophy and chronic small vessel ischemic changes. 3. Left sphenoid sinus disease.  07-06-20: EEG: This study showed evidence of epileptogenic city arising from left temporal region as well as mild diffuse encephalopathy, nonspecific etiology.  No seizures were seen throughout the recording.   07-08-20: chest x-ray:  Left basilar airspace disease could be due to atelectasis or pneumonia, unchanged. Cardiomegaly without edema. Aortic Atherosclerosis  and Emphysema   NO NEW EXAMS  LABS REVIEWED PREVIOUS  06-29-20: wbc 7.8; hgb 9.9 hct 32.0; mcv 94.4 plt 210; glucose 116; bun 59; creat 1.70; k+ 5.2; na++ 134; ca 9.1 liver normal albumin 3.8  07-01-20; wbc 5.8; hgb 8.8; hct 28.9 mcv 93.8 plt 203; glucose 81; bun 55; creat 1.45; k+ 4.0; na++ 138; ca 9.3 liver normal albumin 3.2 mag 2.0; phos 3.3 07-04-20: wbc 12.3; hgb 9.3; hct 29.1; mcv 90.9 plt 182 07-07-20: wbc 6.1; hgb 8.5; hct  28.0; mcv 95.2 plt 181; glucose 90; bun 63; creat 1.38; k+ 4.0; na++ 138; ca 8.6 mag 2.1; phos 3.8  07-08-20: wbc 7.2; hgb 9.2; hct 30.7; mcv 95.6 plt 190; glucose 121; bun 65; creat 1.83; k+ 4.2; na++ 137; ca 8.9 BNP 913.0; INR 3.2 07-09-20: INR 3.6 07-13-20: INR 1.8   TODAY  07-16-20; INR 1.3 on 10 mg daily  07-19-20: INR 1.4   Review of Systems  Constitutional: Negative for malaise/fatigue.  Respiratory: Negative for cough and shortness of breath.   Cardiovascular: Negative for chest pain, palpitations and leg swelling.  Gastrointestinal: Negative for abdominal pain, constipation and heartburn.  Musculoskeletal: Negative for back pain, joint pain and myalgias.  Skin: Negative.   Neurological: Negative for dizziness.  Psychiatric/Behavioral: The patient is not nervous/anxious.     Physical Exam Constitutional:      General: She is not in acute distress.    Appearance: She is well-developed. She is not diaphoretic.  Neck:     Thyroid: No thyromegaly.  Cardiovascular:     Rate and Rhythm: Normal rate and regular rhythm.     Pulses: Normal pulses.     Heart sounds: Normal heart sounds.     Comments: History of aortic and mitral valve  replacements Pacemaker ICD placement  Pulmonary:     Effort: Pulmonary effort is normal. No respiratory distress.     Breath sounds: Normal breath sounds.     Comments: 02 Abdominal:     General: Bowel sounds are normal. There is no distension.     Palpations: Abdomen is soft.     Tenderness: There is no abdominal tenderness.  Musculoskeletal:        General: Normal range of motion.     Cervical back: Neck supple.     Right lower leg: Edema present.     Left lower leg: Edema present.     Comments: Has mild lower extremity edema   Lymphadenopathy:     Cervical: No cervical adenopathy.  Skin:    General: Skin is warm and dry.     Comments: Bilateral lower extremities discolored      Neurological:     Mental Status: She is alert and  oriented to person, place, and time.  Psychiatric:        Mood and Affect: Mood normal.       ASSESSMENT/ PLAN:  TODAY  1. Persistent atrial fibrillation 2. Status post MVR/AVR 3. Chronic anticoagulation   Is without change For INR 1.4 will begin lovenox 80 mg twice daily until INR 2.5-3.5 Will begin coumadin 12 mg daily  Will check INR 07-22-20    MD is aware of resident's narcotic use and is in agreement with current plan of care. We will attempt to wean resident as appropriate.  Ok Edwards NP [facility]  Contact [phone] Monday through Friday 8am- 5pm  After hours call [phone]

## 2020-07-22 ENCOUNTER — Encounter: Payer: Self-pay | Admitting: Adult Health

## 2020-07-22 ENCOUNTER — Non-Acute Institutional Stay (SKILLED_NURSING_FACILITY): Payer: Medicare Other | Admitting: Adult Health

## 2020-07-22 ENCOUNTER — Other Ambulatory Visit (HOSPITAL_COMMUNITY)
Admission: RE | Admit: 2020-07-22 | Discharge: 2020-07-22 | Disposition: A | Discharge status: Home / Self Care | Payer: Medicare Other | Source: Skilled Nursing Facility | Attending: Adult Health | Admitting: Adult Health

## 2020-07-22 DIAGNOSIS — I5022 Chronic systolic (congestive) heart failure: Secondary | ICD-10-CM | POA: Diagnosis not present

## 2020-07-22 DIAGNOSIS — N184 Chronic kidney disease, stage 4 (severe): Secondary | ICD-10-CM

## 2020-07-22 DIAGNOSIS — Z952 Presence of prosthetic heart valve: Secondary | ICD-10-CM | POA: Diagnosis not present

## 2020-07-22 DIAGNOSIS — I4819 Other persistent atrial fibrillation: Secondary | ICD-10-CM

## 2020-07-22 DIAGNOSIS — R569 Unspecified convulsions: Secondary | ICD-10-CM | POA: Diagnosis not present

## 2020-07-22 DIAGNOSIS — Z7901 Long term (current) use of anticoagulants: Secondary | ICD-10-CM | POA: Insufficient documentation

## 2020-07-22 LAB — PROTIME-INR
INR: 1.8 — ABNORMAL HIGH (ref 0.8–1.2)
Prothrombin Time: 20.6 seconds — ABNORMAL HIGH (ref 11.4–15.2)

## 2020-07-22 NOTE — Progress Notes (Signed)
Cardiology Clinic Note   Patient Name: [name] [name] Date of Encounter: 07/23/2020  Primary Care Provider:  Fayrene Helper, MD Primary Cardiologist:  Sanda Klein, MD  Patient Profile    [name] [name] 87 year old female presents today for follow-up evaluation of [name] [name] essential hypertension, and nonischemic cardiomyopathy.  Past Medical History    Past Medical History:  Diagnosis Date  . Acute on chronic diastolic CHF (congestive heart failure) ([place]) 05/19/2012  . Acute on chronic respiratory failure with hypoxia ([place]) 01/31/2020  . Adenomatous polyp of colon 12/16/2002   Dr. [name]/[facility]  . Allergy   . Calculus of gallbladder with chronic cholecystitis without obstruction 02/09/2009   Qualifier: Diagnosis of  By: [name], [name]    . Cataract   . CHB (complete heart block) ([place]) 10/07/2014      . CHF (congestive heart failure) ([place])    a.  EF previously reduced to 25-30% b. EF improved to 60-65% by echo in 10/2017.   Marland Kitchen Chronic kidney disease, stage I 2006  . Constipation       . COPD (chronic obstructive pulmonary disease) ([place])       . DJD (degenerative joint disease) of lumbar spine   . DM (diabetes mellitus) ([place]) 2006  . Esophageal reflux   . Glaucoma   . Gout       . H/O adenomatous polyp of colon 05/24/2012   2004 in [place] colonoscopy 2008 normal    . H/O heart valve replacement with mechanical valve    a. s/p mechanical AVR in 2001 and mechanical MVR in 2004.  Marland Kitchen H/O wisdom tooth extraction   . HIP PAIN, RIGHT 04/23/2009   Qualifier: Diagnosis of  By: [name], [name]    . Hyperlipemia   . IDA (iron deficiency anemia)    Parenteral iron/[name]  . Insomnia       . Nausea without vomiting 06/07/2010   Qualifier: Diagnosis of  By: [name], [name]    . Non-ischemic cardiomyopathy ([place])    a. s/p MDT CRTD  . Obesity, unspecified   . Oxygen deficiency 2014   nocturnal;  . Papilloma of  breast 03/06/2012   This was diagnosed as a left breast papilloma by ultrasound characteristics and symptoms in 2010. Because of possible medical risk factors no surgical intervention has been done and it did doing annual followups. The area has been stable by physical examination and mammograms and ultrasound since 2010.   Marland Kitchen Spinal stenosis of lumbar region 02/19/2013  . Spondylolisthesis   . Spondylosis   . Thyroid cancer ([place])    remote thyroidectomy, no recurrence, pt denies in 2016 thyroid cancer  . Type 2 diabetes mellitus with hyperglycemia ([place]) 07/15/2010  . Unspecified hypothyroidism       . 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 Vertigo        Past Surgical History:  Procedure Laterality Date  . AORTIC AND MITRAL VALVE REPLACEMENT  2001  . BI-VENTRICULAR IMPLANTABLE CARDIOVERTER DEFIBRILLATOR UPGRADE N/A 01/18/2015   Procedure: BI-VENTRICULAR IMPLANTABLE CARDIOVERTER DEFIBRILLATOR UPGRADE;  Surgeon: Evans Lance, MD;  Location: [facility] CATH LAB;  Service: Cardiovascular;  Laterality: N/A;  . BIV ICD GENERATOR CHANGEOUT N/A 01/30/2019   Procedure: BIV ICD GENERATOR CHANGEOUT;  Surgeon: Evans Lance, MD;  Location: [place] [facility] CV LAB;  Service: Cardiovascular;  Laterality: N/A;  . CARDIAC CATHETERIZATION    . CARDIAC VALVE REPLACEMENT    .  CARDIOVERSION N/A 11/13/2016   Procedure: CARDIOVERSION;  Surgeon: Sanda Klein, MD;  Location: [facility] ENDOSCOPY;  Service: Cardiovascular;  Laterality: N/A;  . COLONOSCOPY  06/12/2007   Rourk- Normal rectum/Normal colon  . COLONOSCOPY  12/16/02   small hemorrhoids  . COLONOSCOPY  06/20/2012   Procedure: COLONOSCOPY;  Surgeon: Daneil Dolin, MD;  Location: [facility] ENDO SU[facility];  Service: Endoscopy;  Laterality: N/A;  10:45  . defibrillator implanted 2006    . DOPPLER ECHOCARDIOGRAPHY  2012  . DOPPLER ECHOCARDIOGRAPHY  05,06,07,08,09,2011  . ESOPHAGOGASTRODUODENOSCOPY  06/12/2007   Rourk- normal esophagus, small hiatal hernia,  otherwise normal stomach, D1, D2  . EYE SURGERY Right 2005  . EYE SURGERY Left 2006  . ICD LEAD REMOVAL Left 02/08/2015   Procedure: ICD LEAD REMOVAL;  Surgeon: Evans Lance, MD;  Location: [facility] OR;  Service: Cardiovascular;  Laterality: Left;  "Will plan extraction and insertion of a BiV PM"  **Dr. Roxan Hockey backing up case**  . IMPLANTABLE CARDIOVERTER DEFIBRILLATOR (ICD) GENERATOR CHANGE Left 02/08/2015   Procedure: ICD GENERATOR CHANGE;  Surgeon: Evans Lance, MD;  Location: [place];  Service: Cardiovascular;  Laterality: Left;  . INSERT / REPLACE / REMOVE PACEMAKER    . PACEMAKER INSERTION  May 2016  . pacemaker placed  2004  . right breast cyst removed     benign   . right cataract removed     2005  . TEE WITHOUT CARDIOVERSION N/A 11/13/2016   Procedure: TRANSESOPHAGEAL ECHOCARDIOGRAM (TEE);  Surgeon: Sanda Klein, MD;  Location: [facility] ENDOSCOPY;  Service: Cardiovascular;  Laterality: N/A;  . THYROIDECTOMY      Allergies  Allergies  Allergen Reactions  . Penicillins Other (See Comments)    Bruise    . Aspirin Other (See Comments)    On coumadin  . Fentanyl Dermatitis    Rash with patch   . 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  She was seen for cardiology consult on 02/02/2020 while hospitalized for CHF and atrial fibrillation. Repeat echocardiogram showed an EF of 30 to 35% with global hypokinesis and severely elevated PASP of 71.2 mmHg. She was  treated with IV Lasix. During consult she was felt to be euvolemic. IV Lasix was discontinued and she was started on p.o. Lasix. She was continued on beta-blocker, hydralazine, and  strict INO's and renal labs. She was to continue on O2 via nasal cannula. She was discharged on 02/03/2020.  She was readmitted on 04/02/2020 with complaints of bilateral lower extremity edema worsening over 3 to 4 days with worsening breathing. She was given IV Lasix  x1 and admitted for further observation. Dr. Domenic Polite, saw [name] [name] on consult and she was advised to continue IV Lasix until she was euvolemic, then increase Lasix to 60 mg daily on discharge. She was to continue O2 via nasal cannula at 2 L. She was instructed to return to [name] [name] home medications and anticoagulation therapy.   She contacted our office on 04/09/2020 with complaints of weight gain of 5 pounds edema in  both ankles. She was advised to take metolazone as she has only been taking it once weekly on Saturdays. She was advised to take an extra dose on day of phone call. Lasix was increased to 80 mg daily until follow-up.  She presented to the clinic 04/23/2020 for follow-up evaluation and stated she felt that she had been losing more weight over the last few days.  [name] [name] daughter indicated that [name] [name] ankles did look somewhat better however, during [name] [name] recent hospitalization she had no lower extremity swelling.  [name] [name] breathing was stable and she continued on 2 L.  She stated that she could not wear lower extremity support stockings because she could not apply them.  She did state that she used [name] [name] recliner and elevated [name] [name] legs through the day.  We discussed the appropriate height for elevation.  I increased [name] [name] furosemide to 80 mg x 3 days and [name] [name] potassium to 40 mEq x 3 days, then resume [name] [name] normal dosing.  I will order a BMP for 1 week and planned follow-up for one  week.  Salty 6 she was given.  She was seen by Dr. Sallyanne Kuster on 05/25/2020 during that time  she remained stable.  She continued at NYHA II-3 dyspnea, she continued to use 2 L of home O2.  She reported mild edema in [name] [name] lower extremities at the end of the day which would resolve overnight.  She presented to [facility]/17/2020 with lower extremity edema and increased weight.  Emergency department was not overly concerned that she received an IM dose of furosemide.  She denied dizziness, syncope, and was not aware of palpitations.  She was taken metolazone once a week.  [name] [name] monthly OptiVol downloads remained stable.  She presented to the [facility] emergency department 07/08/2020 with shortness of breath.  [name] [name] oxygen had been increased to 6 L.  She denied chest pain, tightness, heaviness she denied cough fever or chills.  [name] [name] CXR showed cardiomegaly and no acute abnormalities.  She was given 80 mg of IV Lasix.  She was discharged to [facility] [facility] and was transitioned to p.o. furosemide.  She indicated that [name] [name] goal weight to return back home after admission to SNF.  She presents the clinic today for follow-up evaluation states she feels well.  She has been closely monitoring [name] [name] diet.  She has been completing physical therapy daily for about 1/2-hour each day.  She remains on 2 L of oxygen.  She is hoping that she will be able to continue to wean [name] [name] oxygen and discontinue.  She hopes to be able to move in with [name] [name] nephew and continue to become more independent.  I will order a BMP today, give [name] [name] the salty 6 diet sheet, have [name] [name] continue to increase [name] [name] physical activity as tolerated and follow-up in 3 months.  Today she denies chest pain, shortness of breath, increased lower extremity edema, fatigue, palpitations, melena, hematuria, hemoptysis, diaphoresis, weakness, presyncope, syncope, orthopnea, and PND.  Home Medications    Prior to Admission medications   Medication Sig Start Date End Date Taking? Authorizing Provider  acetaminophen (TYLENOL) 500 MG tablet Take 1 tablet  (500 mg total) by mouth every 6 (six) hours as needed (pain). 07/08/20   Oretha Milch D, MD  albuterol (PROVENTIL) (2.5 MG/3ML) 0.083% nebulizer solution Take 3 mLs (2.5 mg total) by nebulization in the morning and at bedtime. *May use additional one time if needed for shortness of breath 07/08/20   Desiree Hane, MD  albuterol (VENTOLIN HFA) 108 (90 Base) MCG/ACT inhaler Inhale 1 puff into the lungs every 6 (six) hours as needed for wheezing or shortness of breath. 07/08/20   [provider]  allopurinol (ZYLOPRIM) 100 MG tablet Take 1 tablet (100 mg total) by mouth in the morning. 04/02/20   Johnson, Clanford L, MD  BREO ELLIPTA 100-25 MCG/INH AEPB 1 TAKE 1 INHALATION BY MOUTH ONCE DAILY 07/08/20   Oretha Milch D, MD  cholecalciferol (VITAMIN D3) 25 MCG (1000 UNIT) tablet Take 1 tablet (1,000 Units total) by mouth in the morning. 07/08/20   Desiree Hane, MD  fluticasone (FLONASE) 50 MCG/ACT nasal spray Place 2 sprays into both nostrils daily. 07/08/20   Desiree Hane, MD  folic acid (FOLVITE) 1 MG tablet TAKE 1 TABLET(1 MG) BY MOUTH DAILY 07/08/20   Oretha Milch D, MD  furosemide (LASIX) 40 MG tablet Take 40 mg by mouth daily. Start 07/13/2020 07/13/20   [provider]  gabapentin (NEURONTIN) 300 MG capsule TAKE 1 CAPSULE(300 MG) BY MOUTH TWICE DAILY 07/08/20   Oretha Milch D, MD  levETIRAcetam (KEPPRA) 500 MG tablet Take 1 tablet (500 mg total) by mouth 2 (two) times daily. 07/08/20   Desiree Hane, MD  Liniments (SALONPAS PAIN RELIEF PATCH EX) Apply 1 patch topically daily as needed (pain).     [provider]  metoprolol succinate (TOPROL-XL) 100 MG 24 hr tablet Take 1 tablet (100 mg total) by mouth daily. TAKE 1 TABLET BY MOUTH EVERY DAY WITH FOOD OR MILK 07/08/20   Oretha Milch D, MD  nitroGLYCERIN (NITROSTAT) 0.4 MG SL tablet Place 1 tablet (0.4 mg total) under the tongue every 5 (five) minutes as needed for chest pain. 07/08/20   Desiree Hane, MD  NON FORMULARY Diet: Soft, NAS, cons CHO 07/08/20   [provider]  ondansetron (ZOFRAN ODT) 4 MG disintegrating tablet Take 1 tablet (4 mg total) by mouth every 8 (eight) hours as needed for nausea or vomiting. 07/08/20   Oretha Milch D, MD  OXYGEN Inhale 3 L into the lungs continuous. 07/08/20   [provider]  Polyethyl [name] (SYSTANE) 0.4-0.3 % GEL ophthalmic gel Place 1 application into the left eye at bedtime. 07/08/20   Oretha Milch D, MD  polyethylene glycol (MIRALAX / GLYCOLAX) 17 g packet Take 17 g by mouth daily as needed. 07/08/20   Oretha Milch D, MD  potassium chloride SA (KLOR-CON) 20 MEQ tablet Take 20 mEq by mouth daily. 07/12/20   [provider]  pravastatin (PRAVACHOL) 40 MG tablet Take 1 tablet (40 mg total) by mouth daily. 07/08/20   Desiree Hane, MD  senna (SENOKOT) 8.6 MG TABS tablet Take 2 tablets (17.2 mg total) by mouth daily as needed for mild constipation. 07/08/20   Desiree Hane, MD  SYNTHROID 75 MCG tablet Take 1 tablet (75 mcg total) by mouth daily before breakfast. 07/08/20   Oretha Milch D, MD  warfarin (COUMADIN) 10 MG tablet Take 10 mg by mouth daily. Special Instructions: INR goal 2.5-3.5 due to valve replacements 07/18/20   [provider]    Family History    Family History  Problem Relation Age of Onset  . Pancreatic cancer Mother   . Heart disease Father   . Heart disease Sister   . Heart attack Sister   . Heart disease Brother   . Diabetes Brother   . Heart disease Brother   . Diabetes Brother   . Hypertension  Brother   . Lung cancer Brother    She indicated that [name] [name] mother is deceased. She indicated that [name] [name] father is deceased. She indicated that [name] [name] sister is deceased. She indicated that all of [name] [name] three brothers are deceased.  Social History    Social History   Socioeconomic History  . Marital status: Widowed    Spouse name: Not on file  . Number of children: 0    . Years of education: Not on file  . Highest education level: Not on file  Occupational History  . Occupation: retired    Fish farm manager: RETIRED  Tobacco Use  . Smoking status: Former Smoker    Packs/day: 0.75    Years: 40.00    Pack years: 30.00    Types: Cigarettes    Quit date: 03/08/1987    Years since quitting: 33.4  . Smokeless tobacco: Never Used  Vaping Use  . Vaping Use: Never used  Substance and Sexual Activity  . Alcohol use: No    Alcohol/week: 0.0 standard drinks  . Drug use: No  . Sexual activity: Not Currently  Other Topics Concern  . Not on file  Social History Narrative   From [place] (2004)   Social Determinants of Health   Financial Resource Strain: Low Risk   . Difficulty of Paying Living Expenses: Not hard at all  Food Insecurity: No Food Insecurity  . Worried About Charity fundraiser in the Last Year: Never true  . Ran Out of Food in the Last Year: Never true  Transportation Needs: No Transportation Needs  . Lack of Transportation (Medical): No  . Lack of Transportation (Non-Medical): No  Physical Activity: Inactive  . Days of Exercise per Week: 0 days  . Minutes of Exercise per Session: 0 min  Stress: No Stress Concern Present  . Feeling of Stress : Only a little  Social Connections: Moderately Isolated  . Frequency of Communication with Friends and Family: More than three times a week  . Frequency of Social Gatherings with Friends and Family: Twice a week  . Attends Religious Services: More than 4 times per year  . Active Member of Clubs or Organizations: No  . Attends Archivist Meetings: Never  . Marital Status: Widowed  Intimate Partner Violence:   . Fear of Current or Ex-Partner: Not on file  . Emotionally Abused: Not on file  . Physically Abused: Not on file  . Sexually Abused: Not on file     Review of Systems    General:  No chills, fever, night sweats or weight changes.  Cardiovascular:  No chest pain, dyspnea on  exertion, edema, orthopnea, palpitations, paroxysmal nocturnal dyspnea. Dermatological: No rash, lesions/masses Respiratory: No cough, dyspnea Urologic: No hematuria, dysuria Abdominal:   No nausea, vomiting, diarrhea, bright red blood per rectum, melena, or hematemesis Neurologic:  No visual changes, wkns, changes in mental status. All other systems reviewed and are otherwise negative except as noted above.  Physical Exam    VS:  BP 126/62   Pulse 60   Ht 5\' 7"  (1.702 m)   Wt 169 lb (76.7 kg)   BMI 26.47 kg/m  , BMI Body mass index is 26.47 kg/m. GEN: Well nourished, well developed, in no acute distress. HEENT: normal. Neck: Supple, no JVD, carotid bruits, or masses. Cardiac: RRR, no murmurs, rubs, or gallops. No clubbing, cyanosis, edema.  Radials/DP/PT 2+ and equal bilaterally.  Respiratory:  Respirations regular and unlabored, clear to auscultation bilaterally. GI: Soft, nontender, nondistended,  BS + x 4. MS: no deformity or atrophy. Skin: warm and dry, no rash. Neuro:  Strength and sensation are intact. Psych: Normal affect.  Accessory Clinical Findings    Recent Labs: 02/16/2020: TSH 2.32 07/07/2020: ALT 18; Magnesium 2.1 07/08/2020: B Natriuretic Peptide 913.0; BUN 65; Creatinine, Ser 1.83; Hemoglobin 9.2; Platelets 190; Potassium 4.2; Sodium 137   Recent Lipid Panel    Component Value Date/Time   CHOL 124 01/05/2020 1253   CHOL 165 10/16/2016 1213   TRIG 73 01/05/2020 1253   HDL 44 (L) 01/05/2020 1253   HDL 31 (L) 10/16/2016 1213   CHOLHDL 2.8 01/05/2020 1253   VLDL 12 07/07/2019 1354   LDLCALC 65 01/05/2020 1253    ECG personally reviewed by me today-none today.  Echocardiogram 01/31/2020 1. Left ventricular ejection fraction, by estimation, is 30 to 35%. The  left ventricle has moderately decreased function. The left ventricle  demonstrates global hypokinesis. There is mild concentric left ventricular  hypertrophy. Left ventricular  diastolic function  could not be evaluated.  2. Right ventricular systolic function is normal. The right ventricular  size is normal. There is severely elevated pulmonary artery systolic  pressure. The estimated right ventricular systolic pressure is 65.7 mmHg.  3. Left atrial size was severely dilated.  4. Right atrial size was moderately dilated.  5. The mitral valve has been repaired/replaced. No evidence of mitral  valve regurgitation.  6. The aortic valve has been repaired/replaced. Aortic valve  regurgitation is not visualized.  Assessment & Plan   1.  Chronic Combined Systolic and Diastolic CHF-weight today 169 pounds today, 190 04/01/20.  Euvolemic today. [name] [name] device was previously reprogrammed as it was not felt to be capturing appropriately and this could have contributed to [name] [name] recurrent cardiomyopathy.    Reviewed taking metolazone 30 minutes prior to diuretic therapy.  Patient and daughter expressed understanding.  Recently visited, [facility] ED was given IV furosemide then admitted to [facility] for continued care. Continue furosemide, metolazone, metoprolol Heart healthy low-sodium diet-salty 6 given Increase physical activity as tolerated Daily weights-contact office with a weight increase of 3 pounds overnight or 5 pounds in 1 week. Fluid restriction of 64 ounces - 80 ounces daily Continue physical therapy Elevate legs when not active Order BMP  CHB-received [employer] BiV PPM which was placed on8/19/2021.  Follows with Dr. Lovena Le.  PersistentAtrial Fibrillation/Flutter-heart rate today 60.  Denies bleeding issues.  Noted to have chronic anemia (hemoglobin 9.8 and stable on 04/14/2020). Continue metoprolol, Coumadin Avoid triggers caffeine, chocolate, EtOH etc. Heart healthy low-sodium diet-salty 6 given Increase physical activity as tolerated    Mechanical AVR in 2001 andMechanical MVR in 2004-Echo 01/2020 showed normal valve function.  INR 1.8 on 07/22/2020. Continue  Coumadin  Stage 3 CKD-1.51 on 04/14/2020 and stable at 1.4 over the last several days.  K+ 4.6, and NA 135. Monitored by PCP  Disposition: Follow-up with Dr. Sallyanne Kuster or me in 3 months.   Jossie Ng. Mackinley Cassaday NP-C    07/23/2020, 12:20 PM [facility] [place] [address] Office [phone] Fax [phone]  Notice: This dictation was prepared with Dragon dictation along with smaller phrase technology. Any transcriptional errors that result from this process are unintentional and may not be corrected upon review.

## 2020-07-22 NOTE — Progress Notes (Signed)
Location:    [facility] Room Number: 151/P Place of Service:  SNF (31)   CODE STATUS: DNR  Allergies  Allergen Reactions   Penicillins Other (See Comments)    Bruise     Aspirin Other (See Comments)    On coumadin   Fentanyl Dermatitis    Rash with patch    Niacin Itching    Chief Complaint  Patient presents with   Acute Visit    Care Plan Meeting    HPI:  We have come together for her care plan meeting. Family present. BIMS 14/15 mood 5/30. Her weight is stable and she has not had any falls.  Her goal is to return home with her nephew; she had 4 hours of care daily; she will need more care to include her adl needs; meals etc.  At this time it is not medically recommended that she go home; as she is not safe to do so. She continues to participate with therapy is ambulating 20 feet with therapy; at this time she will be wheelchair level at home. She requires extensive assist with her adls; is able to feed herself. She is frequently incontinent of bladder and bowel. She needs her medications crushed at this time; she is on toprol xl and keppra. Her INR today is 1.8. her goal is 2.5-3.5.  She is taking coumadin 12 mg daily and is on lovenox until INR therapeutic. She will continue to be followed for her chronic illnesses including: Chronic systolic congestive heart failure CKD (chronic kidney disease) stage IV Seizure Persistent atrial fibrillation S/P MVR (mitral valve replacement)  Hx of aortic valve replacement  Past Medical History:  Diagnosis Date   Acute on chronic diastolic CHF (congestive heart failure) ([place]) 05/19/2012   Acute on chronic respiratory failure with hypoxia ([place]) 01/31/2020   Adenomatous polyp of colon 12/16/2002   Dr. [name]/[facility]   Allergy    Calculus of gallbladder with chronic cholecystitis without obstruction 02/09/2009   Qualifier: Diagnosis of  By: [name], Margaret     Cataract      CHB (complete heart block) ([place]) 10/07/2014       CHF (congestive heart failure) ([place])    a.  EF previously reduced to 25-30% b. EF improved to 60-65% by echo in 10/2017.    Chronic kidney disease, stage I 2006   Constipation        COPD (chronic obstructive pulmonary disease) ([facility])        DJD (degenerative joint disease) of lumbar spine    DM (diabetes mellitus) ([place]) 2006   Esophageal reflux    Glaucoma    Gout        H/O adenomatous polyp of colon 05/24/2012   2004 in [place] colonoscopy 2008 normal     H/O heart valve replacement with mechanical valve    a. s/p mechanical AVR in 2001 and mechanical MVR in 2004.   H/O wisdom tooth extraction    HIP PAIN, RIGHT 04/23/2009   Qualifier: Diagnosis of  By: [name], Margaret     Hyperlipemia    IDA (iron deficiency anemia)    Parenteral iron/[name]   Insomnia        Nausea without vomiting 06/07/2010   Qualifier: Diagnosis of  By: [name], Margaret     Non-ischemic cardiomyopathy [facility])    a. s/p MDT CRTD   Obesity, unspecified    Oxygen deficiency 2014   nocturnal;  Papilloma of breast 03/06/2012   This was diagnosed as a left breast papilloma by ultrasound characteristics and symptoms in 2010. Because of possible medical risk factors no surgical intervention has been done and it did doing annual followups. The area has been stable by physical examination and mammograms and ultrasound since 2010.    Spinal stenosis of lumbar region 02/19/2013   Spondylolisthesis    Spondylosis    Thyroid cancer ([place])    remote thyroidectomy, no recurrence, pt denies in 2016 thyroid cancer   Type 2 diabetes mellitus with hyperglycemia ([place]) 07/15/2010   Unspecified hypothyroidism        Varicose veins of lower extremities with other complications    Vertigo         Past Surgical History:  Procedure Laterality Date   AORTIC AND MITRAL VALVE REPLACEMENT  2001   BI-VENTRICULAR IMPLANTABLE  CARDIOVERTER DEFIBRILLATOR UPGRADE N/A 01/18/2015   Procedure: BI-VENTRICULAR IMPLANTABLE CARDIOVERTER DEFIBRILLATOR UPGRADE;  Surgeon: Evans Lance, MD;  Location: [facility] CATH LAB;  Service: Cardiovascular;  Laterality: N/A;   BIV ICD GENERATOR CHANGEOUT N/A 01/30/2019   Procedure: BIV ICD GENERATOR CHANGEOUT;  Surgeon: Evans Lance, MD;  Location: [facility] CV LAB;  Service: Cardiovascular;  Laterality: N/A;   CARDIAC CATHETERIZATION     CARDIAC VALVE REPLACEMENT     CARDIOVERSION N/A 11/13/2016   Procedure: CARDIOVERSION;  Surgeon: Sanda Klein, MD;  Location: [facility] ENDOSCOPY;  Service: Cardiovascular;  Laterality: N/A;   COLONOSCOPY  06/12/2007   Rourk- Normal rectum/Normal colon   COLONOSCOPY  12/16/02   small hemorrhoids   COLONOSCOPY  06/20/2012   Procedure: COLONOSCOPY;  Surgeon: Daneil Dolin, MD;  [facility];  Service: Endoscopy;  Laterality: N/A;  10:45   defibrillator implanted 2006     DOPPLER ECHOCARDIOGRAPHY  2012   DOPPLER ECHOCARDIOGRAPHY  05,06,07,08,09,2011   ESOPHAGOGASTRODUODENOSCOPY  06/12/2007   Rourk- normal esophagus, small hiatal hernia, otherwise normal stomach, D1, D2   EYE SURGERY Right 2005   EYE SURGERY Left 2006   ICD LEAD REMOVAL Left 02/08/2015   Procedure: ICD LEAD REMOVAL;  Surgeon: Evans Lance, MD;  Location: [facility];  Service: Cardiovascular;  Laterality: Left;  "Will plan extraction and insertion of a BiV PM"  **Dr. Roxan Hockey backing up case**   IMPLANTABLE CARDIOVERTER DEFIBRILLATOR (ICD) GENERATOR CHANGE Left 02/08/2015   Procedure: ICD GENERATOR CHANGE;  Surgeon: Evans Lance, MD;  Location: [facility];  Service: Cardiovascular;  Laterality: Left;   INSERT / REPLACE / REMOVE PACEMAKER     PACEMAKER INSERTION  May 2016   pacemaker placed  2004   right breast cyst removed     benign    right cataract removed     2005   TEE WITHOUT CARDIOVERSION N/A 11/13/2016   Procedure: TRANSESOPHAGEAL ECHOCARDIOGRAM (TEE);   Surgeon: Sanda Klein, MD;  Location: [facility] ENDOSCOPY;  Service: Cardiovascular;  Laterality: N/A;   THYROIDECTOMY      Social History   Socioeconomic History   Marital status: Widowed    Spouse name: Not on file   Number of children: 0   Years of education: Not on file   Highest education level: Not on file  Occupational History   Occupation: retired    Fish farm manager: RETIRED  Tobacco Use   Smoking status: Former Smoker    Packs/day: 0.75    Years: 40.00    Pack years: 30.00    Types: Cigarettes    Quit date: 03/08/1987    Years since  quitting: 33.3   Smokeless tobacco: Never Used  Vaping Use   Vaping Use: Never used  Substance and Sexual Activity   Alcohol use: No    Alcohol/week: 0.0 standard drinks   Drug use: No   Sexual activity: Not Currently  Other Topics Concern   Not on file  Social History Narrative   From [place] (2004)   Social Determinants of Health   Financial Resource Strain: Low Risk    Difficulty of Paying Living Expenses: Not hard at all  Food Insecurity: No Food Insecurity   Worried About Charity fundraiser in the Last Year: Never true   Arboriculturist in the Last Year: Never true  Transportation Needs: No Transportation Needs   Lack of Transportation (Medical): No   Lack of Transportation (Non-Medical): No  Physical Activity: Inactive   Days of Exercise per Week: 0 days   Minutes of Exercise per Session: 0 min  Stress: No Stress Concern Present   Feeling of Stress : Only a little  Social Connections: Moderately Isolated   Frequency of Communication with Friends and Family: More than three times a week   Frequency of Social Gatherings with Friends and Family: Twice a week   Attends Religious Services: More than 4 times per year   Active Member of [employer] or Organizations: No   Attends Archivist Meetings: Never   Marital Status: Widowed  Human resources officer Violence:    Fear of Current or Ex-Partner: Not on  file   Emotionally Abused: Not on file   Physically Abused: Not on file   Sexually Abused: Not on file   Family History  Problem Relation Age of Onset   Pancreatic cancer Mother    Heart disease Father    Heart disease Sister    Heart attack Sister    Heart disease Brother    Diabetes Brother    Heart disease Brother    Diabetes Brother    Hypertension Brother    Lung cancer Brother       VITAL SIGNS BP (!) 166/75    Pulse 60    Temp 98.2 F (36.8 C)    Resp 20    Ht 5\' 7"  (1.702 m)    Wt 191 lb (86.6 kg)    SpO2 95%    BMI 29.91 kg/m   Facility-Administered Encounter Medications as of 07/22/2020  Medication   epoetin alfa (EPOGEN,PROCRIT) injection 40,000 Units   Outpatient Encounter Medications as of 07/22/2020  Medication Sig   acetaminophen (TYLENOL) 500 MG tablet Take 1 tablet (500 mg total) by mouth every 6 (six) hours as needed (pain).   albuterol (PROVENTIL) (2.5 MG/3ML) 0.083% nebulizer solution Take 3 mLs (2.5 mg total) by nebulization in the morning and at bedtime. *May use additional one time if needed for shortness of breath   albuterol (VENTOLIN HFA) 108 (90 Base) MCG/ACT inhaler Inhale 1 puff into the lungs every 6 (six) hours as needed for wheezing or shortness of breath.   allopurinol (ZYLOPRIM) 100 MG tablet Take 1 tablet (100 mg total) by mouth in the morning.   BREO ELLIPTA 100-25 MCG/INH AEPB 1 TAKE 1 INHALATION BY MOUTH ONCE DAILY   cholecalciferol (VITAMIN D3) 25 MCG (1000 UNIT) tablet Take 1 tablet (1,000 Units total) by mouth in the morning.   enoxaparin (LOVENOX) 80 MG/0.8ML injection Inject 80 mg into the skin 2 (two) times daily.   fluticasone (FLONASE) 50 MCG/ACT nasal spray Place 2 sprays into both nostrils  daily.   folic acid (FOLVITE) 1 MG tablet TAKE 1 TABLET(1 MG) BY MOUTH DAILY   furosemide (LASIX) 40 MG tablet Take 40 mg by mouth daily. Start 07/13/2020   gabapentin (NEURONTIN) 300 MG capsule TAKE 1 CAPSULE(300  MG) BY MOUTH TWICE DAILY   levETIRAcetam (KEPPRA) 500 MG tablet Take 1 tablet (500 mg total) by mouth 2 (two) times daily.   Liniments (SALONPAS PAIN RELIEF PATCH EX) Apply 1 patch topically daily as needed (pain).    metoprolol succinate (TOPROL-XL) 100 MG 24 hr tablet Take 1 tablet (100 mg total) by mouth daily. TAKE 1 TABLET BY MOUTH EVERY DAY WITH FOOD OR MILK   nitroGLYCERIN (NITROSTAT) 0.4 MG SL tablet Place 1 tablet (0.4 mg total) under the tongue every 5 (five) minutes as needed for chest pain.   NON FORMULARY Diet: Soft, NAS, cons CHO   ondansetron (ZOFRAN ODT) 4 MG disintegrating tablet Take 1 tablet (4 mg total) by mouth every 8 (eight) hours as needed for nausea or vomiting.   OXYGEN Inhale 3 L into the lungs continuous.   Polyethyl [name] (SYSTANE) 0.4-0.3 % GEL ophthalmic gel Place 1 application into the left eye at bedtime.   polyethylene glycol (MIRALAX / GLYCOLAX) 17 g packet Take 17 g by mouth daily as needed.   potassium chloride SA (KLOR-CON) 20 MEQ tablet Take 20 mEq by mouth daily.   pravastatin (PRAVACHOL) 40 MG tablet Take 1 tablet (40 mg total) by mouth daily.   senna (SENOKOT) 8.6 MG TABS tablet Take 2 tablets (17.2 mg total) by mouth daily as needed for mild constipation.   SYNTHROID 75 MCG tablet Take 1 tablet (75 mcg total) by mouth daily before breakfast.   warfarin (COUMADIN) 10 MG tablet Take 10 mg by mouth every evening. Special Instructions: INR goal 2.5-3.5 due to valve replacements    warfarin (COUMADIN) 2 MG tablet Take 2 mg by mouth every evening.     SIGNIFICANT DIAGNOSTIC EXAMS  PREVIOUS  06-29-20 chest x-ray:  1. Stable cardiomegaly. 2. Unchanged interstitial coarsening, this is similar to multiple prior exams. Septal thickening on prior CT suggests an element of pulmonary edema. There is background emphysema on CT. 3. No new or acute airspace disease  07-03-20: ct of head:  1. No acute intracranial findings. 2. Mildly  progressive atrophy and chronic small vessel ischemic changes. 3. Left sphenoid sinus disease.  07-06-20: EEG: This study showed evidence of epileptogenic city arising from left temporal region as well as mild diffuse encephalopathy, nonspecific etiology.  No seizures were seen throughout the recording.   07-08-20: chest x-ray:  Left basilar airspace disease could be due to atelectasis or pneumonia, unchanged. Cardiomegaly without edema. Aortic Atherosclerosis  and Emphysema   NO NEW EXAMS  LABS REVIEWED PREVIOUS  06-29-20: wbc 7.8; hgb 9.9 hct 32.0; mcv 94.4 plt 210; glucose 116; bun 59; creat 1.70; k+ 5.2; na++ 134; ca 9.1 liver normal albumin 3.8  07-01-20; wbc 5.8; hgb 8.8; hct 28.9 mcv 93.8 plt 203; glucose 81; bun 55; creat 1.45; k+ 4.0; na++ 138; ca 9.3 liver normal albumin 3.2 mag 2.0; phos 3.3 07-04-20: wbc 12.3; hgb 9.3; hct 29.1; mcv 90.9 plt 182 07-07-20: wbc 6.1; hgb 8.5; hct 28.0; mcv 95.2 plt 181; glucose 90; bun 63; creat 1.38; k+ 4.0; na++ 138; ca 8.6 mag 2.1; phos 3.8  07-08-20: wbc 7.2; hgb 9.2; hct 30.7; mcv 95.6 plt 190; glucose 121; bun 65; creat 1.83; k+ 4.2; na++ 137; ca 8.9 BNP 913.0;  INR 3.2 07-09-20: INR 3.6 07-13-20: INR 1.8  07-16-20; INR 1.3 on 10 mg daily  07-19-20: INR 1.4   TODAY   07-22-20: INR 1.8    Review of Systems  Constitutional: Negative for malaise/fatigue.  Respiratory: Negative for cough and shortness of breath.   Cardiovascular: Negative for chest pain, palpitations and leg swelling.  Gastrointestinal: Negative for abdominal pain, constipation and heartburn.  Musculoskeletal: Negative for back pain, joint pain and myalgias.  Skin: Negative.   Neurological: Negative for dizziness.  Psychiatric/Behavioral: The patient is not nervous/anxious.     Physical Exam Constitutional:      General: She is not in acute distress.    Appearance: She is well-developed. She is not diaphoretic.  Neck:     Thyroid: No thyromegaly.  Cardiovascular:      Rate and Rhythm: Normal rate and regular rhythm.     Heart sounds: Normal heart sounds.     Comments: History of aortic and mitral valve replacements Pacemaker ICD placement  Pulmonary:     Effort: Pulmonary effort is normal. No respiratory distress.     Breath sounds: Normal breath sounds.     Comments: 02 Abdominal:     General: Bowel sounds are normal. There is no distension.     Palpations: Abdomen is soft.     Tenderness: There is no abdominal tenderness.  Musculoskeletal:        General: Normal range of motion.     Cervical back: Neck supple.     Right lower leg: Edema present.     Left lower leg: Edema present.     Comments: Has mild lower extremity edema    Lymphadenopathy:     Cervical: No cervical adenopathy.  Skin:    General: Skin is warm and dry.     Comments: Bilateral lower extremities discolored       Neurological:     Mental Status: She is alert and oriented to person, place, and time.  Psychiatric:        Mood and Affect: Mood normal.       ASSESSMENT/ PLAN:  TODAY  1. Chronic systolic congestive heart failure 2. CKD (chronic kidney disease) stage IV 3. Seizure 4. Persistent atrial fibrillation 5. S/P MVR (mitral valve replacement) 6. Hx of aortic valve replacement  Will stop toprol xl and will begin lopressor 50 mg twice daily  Will change to keppra liquid 500 mg twice daily  For INR 1.8 will give an extra 5 mg coumadin today  Will repeat INR 07-26-20 Will continue therapy as directed Will continue current plan of care Her goal at this time is to return back home; however; she is not ready for discharge to home at this time.      MD is aware of resident's narcotic use and is in agreement with current plan of care. We will attempt to wean resident as appropriate.  Ok Edwards NP [facility]  Contact [phone] Monday through Friday 8am- 5pm  After hours call [phone]

## 2020-07-23 ENCOUNTER — Telehealth: Payer: Self-pay | Admitting: Cardiovascular Disease

## 2020-07-23 ENCOUNTER — Encounter: Payer: Self-pay | Admitting: General Practice

## 2020-07-23 ENCOUNTER — Non-Acute Institutional Stay (SKILLED_NURSING_FACILITY): Payer: Medicare Other | Admitting: Adult Health

## 2020-07-23 ENCOUNTER — Ambulatory Visit (INDEPENDENT_AMBULATORY_CARE_PROVIDER_SITE_OTHER): Payer: Medicare Other | Admitting: General Practice

## 2020-07-23 ENCOUNTER — Encounter: Payer: Self-pay | Admitting: Adult Health

## 2020-07-23 VITALS — BP 126/62 | HR 60 | Ht 67.0 in | Wt 169.0 lb

## 2020-07-23 DIAGNOSIS — R569 Unspecified convulsions: Secondary | ICD-10-CM

## 2020-07-23 DIAGNOSIS — J42 Unspecified chronic bronchitis: Secondary | ICD-10-CM | POA: Diagnosis not present

## 2020-07-23 DIAGNOSIS — I4821 Permanent atrial fibrillation: Secondary | ICD-10-CM | POA: Diagnosis not present

## 2020-07-23 DIAGNOSIS — N184 Chronic kidney disease, stage 4 (severe): Secondary | ICD-10-CM

## 2020-07-23 DIAGNOSIS — I5042 Chronic combined systolic (congestive) and diastolic (congestive) heart failure: Secondary | ICD-10-CM

## 2020-07-23 DIAGNOSIS — I442 Atrioventricular block, complete: Secondary | ICD-10-CM | POA: Diagnosis not present

## 2020-07-23 DIAGNOSIS — Z952 Presence of prosthetic heart valve: Secondary | ICD-10-CM | POA: Diagnosis not present

## 2020-07-23 DIAGNOSIS — Z79899 Other long term (current) drug therapy: Secondary | ICD-10-CM

## 2020-07-23 NOTE — Telephone Encounter (Signed)
[name] [name] with [facility] states the patient is currently admitted and she would like to make Dr. Sallyanne Kuster aware that they have been turning the patient's medications to liquid form. If any questions please return call to [place] at [phone].

## 2020-07-23 NOTE — Telephone Encounter (Signed)
FYI

## 2020-07-23 NOTE — Progress Notes (Signed)
Location:    [facility] Room Number: 151-P Place of Service:  SNF (31)   CODE STATUS: DNR  Allergies  Allergen Reactions   Penicillins Other (See Comments)    Bruise     Aspirin Other (See Comments)    On coumadin   Fentanyl Dermatitis    Rash with patch    Niacin Itching    Chief Complaint  Patient presents with   Routine Visit         Chronic bronchitis unspecified bronchitis type:    Seizure:    CKD (chronic kidney disease) stage IV:   Weekly follow up f for the first 30 days post hospitalization     HPI:  She is a 86 year old short term rehab patient being seen for the management of her chronic illnesses:  Chronic bronchitis unspecified bronchitis type:    Seizure:    CKD (chronic kidney disease) stage IV. She continues to participate in therapy. There are no reports of cough; no shortness of breath no uncontrolled pain.   Past Medical History:  Diagnosis Date   Acute on chronic diastolic CHF (congestive heart failure) ([place]) 05/19/2012   Acute on chronic respiratory failure with hypoxia ([place]) 01/31/2020   Adenomatous polyp of colon 12/16/2002   Dr. [name]/[facility]   Allergy    Calculus of gallbladder with chronic cholecystitis without obstruction 02/09/2009   Qualifier: Diagnosis of  By: [name], Margaret     Cataract    CHB (complete heart block) ([place]) 10/07/2014       CHF (congestive heart failure) ([place])    a.  EF previously reduced to 25-30% b. EF improved to 60-65% by echo in 10/2017.    Chronic kidney disease, stage I 2006   Constipation        COPD (chronic obstructive pulmonary disease) (H[facility])        DJD (degenerative joint disease) of lumbar spine    DM (diabetes mellitus) ([place]) 2006   Esophageal reflux    Glaucoma    Gout        H/O adenomatous polyp of colon 05/24/2012   [place] 2008 normal     H/O heart valve replacement with mechanical valve     a. s/p mechanical AVR in 2001 and mechanical MVR in 2004.   H/O wisdom tooth extraction    HIP PAIN, RIGHT 04/23/2009   Qualifier: Diagnosis of  By: [name], Margaret     Hyperlipemia    IDA (iron deficiency anemia)    Parenteral iron/[name]   Insomnia        Nausea without vomiting 06/07/2010   Qualifier: Diagnosis of  By: [name], Margaret     Non-ischemic cardiomyopathy [facility])    a. s/p MDT CRTD   Obesity, unspecified    Oxygen deficiency 2014   nocturnal;   Papilloma of breast 03/06/2012   This was diagnosed as a left breast papilloma by ultrasound characteristics and symptoms in 2010. Because of possible medical risk factors no surgical intervention has been done and it did doing annual followups. The area has been stable by physical examination and mammograms and ultrasound since 2010.    Spinal stenosis of lumbar region 02/19/2013   Spondylolisthesis    Spondylosis    Thyroid cancer ([place])    remote thyroidectomy, no recurrence, pt denies in 2016 thyroid cancer   Type 2 diabetes mellitus with hyperglycemia ([place]) 07/15/2010   Unspecified  hypothyroidism        Varicose veins of lower extremities with other complications    Vertigo         Past Surgical History:  Procedure Laterality Date   AORTIC AND MITRAL VALVE REPLACEMENT  2001   BI-VENTRICULAR IMPLANTABLE CARDIOVERTER DEFIBRILLATOR UPGRADE N/A 01/18/2015   Procedure: BI-VENTRICULAR IMPLANTABLE CARDIOVERTER DEFIBRILLATOR UPGRADE;  Surgeon: Evans Lance, MD;  Location: [facility] CATH LAB;  Service: Cardiovascular;  Laterality: N/A;   BIV ICD GENERATOR CHANGEOUT N/A 01/30/2019   Procedure: BIV ICD GENERATOR CHANGEOUT;  Surgeon: Evans Lance, MD;  Location: [facility] CV LAB;  Service: Cardiovascular;  Laterality: N/A;   CARDIAC CATHETERIZATION     CARDIAC VALVE REPLACEMENT     CARDIOVERSION N/A 11/13/2016   Procedure: CARDIOVERSION;  Surgeon: Sanda Klein, MD;  Location: [facility] ENDOSCOPY;   Service: Cardiovascular;  Laterality: N/A;   COLONOSCOPY  06/12/2007   [place]- Normal rectum/Normal colon   COLONOSCOPY  12/16/02   small hemorrhoids   COLONOSCOPY  06/20/2012   Procedure: COLONOSCOPY;  Surgeon: Daneil Dolin, MD;  [facility]: [facility];  Service: Endoscopy;  Laterality: N/A;  10:45   defibrillator implanted 2006     DOPPLER ECHOCARDIOGRAPHY  2012   DOPPLER ECHOCARDIOGRAPHY  05,06,07,08,09,2011   ESOPHAGOGASTRODUODENOSCOPY  06/12/2007   [place]ourk- normal esophagus, small hiatal hernia, otherwise normal stomach, D1, D2   EYE SURGERY Right 2005   EYE SURGERY Left 2006   ICD LEAD REMOVAL Left 02/08/2015   Procedure: ICD LEAD REMOVAL;  Surgeon: Evans Lance, MD;  Location: [facility];  Service: Cardiovascular;  Laterality: Left;  "Will plan extraction and insertion of a BiV PM"  **Dr. Roxan Hockey backing up case**   IMPLANTABLE CARDIOVERTER DEFIBRILLATOR (ICD) GENERATOR CHANGE Left 02/08/2015   Procedure: ICD GENERATOR CHANGE;  Surgeon: Evans Lance, MD;  Location: [facility];  Service: Cardiovascular;  Laterality: Left;   INSERT / REPLACE / REMOVE PACEMAKER     PACEMAKER INSERTION  May 2016   pacemaker placed  2004   right breast cyst removed     benign    right cataract removed     2005   TEE WITHOUT CARDIOVERSION N/A 11/13/2016   Procedure: TRANSESOPHAGEAL ECHOCARDIOGRAM (TEE);  Surgeon: Sanda Klein, MD;  Location: [facility] ENDOSCOPY;  Service: Cardiovascular;  Laterality: N/A;   THYROIDECTOMY      Social History   Socioeconomic History   Marital status: Widowed    Spouse name: Not on file   Number of children: 0   Years of education: Not on file   Highest education level: Not on file  Occupational History   Occupation: retired    Fish farm manager: RETIRED  Tobacco Use   Smoking status: Former Smoker    Packs/day: 0.75    Years: 40.00    Pack years: 30.00    Types: Cigarettes    Quit date: 03/08/1987    Years since quitting: 33.4   Smokeless tobacco:  Never Used  Vaping Use   Vaping Use: Never used  Substance and Sexual Activity   Alcohol use: No    Alcohol/week: 0.0 standard drinks   Drug use: No   Sexual activity: Not Currently  Other Topics Concern   Not on file  Social History Narrative   From [place] (2004)   Social Determinants of Health   Financial Resource Strain: Low Risk    Difficulty of Paying Living Expenses: Not hard at all  Food Insecurity: No Food Insecurity   Worried About Running Out of  Food in the Last Year: Never true   [place] in the Last Year: Never true  Transportation Needs: No Transportation Needs   Lack of Transportation (Medical): No   Lack of Transportation (Non-Medical): No  Physical Activity: Inactive   Days of Exercise per Week: 0 days   Minutes of Exercise per Session: 0 min  Stress: No Stress Concern Present   Feeling of Stress : Only a little  Social Connections: Moderately Isolated   Frequency of Communication with Friends and Family: More than three times a week   Frequency of Social Gatherings with Friends and Family: Twice a week   Attends Religious Services: More than 4 times per year   Active Member of [employer] or Organizations: No   Attends Archivist Meetings: Never   Marital Status: Widowed  Human resources officer Violence:    Fear of Current or Ex-Partner: Not on file   Emotionally Abused: Not on file   Physically Abused: Not on file   Sexually Abused: Not on file   Family History  Problem Relation Age of Onset   Pancreatic cancer Mother    Heart disease Father    Heart disease Sister    Heart attack Sister    Heart disease Brother    Diabetes Brother    Heart disease Brother    Diabetes Brother    Hypertension Brother    Lung cancer Brother       VITAL SIGNS BP (!) 166/75    Pulse 60    Temp 98.1 F (36.7 C)    Resp 20    Ht 5\' 7"  (1.702 m)    Wt 191 lb (86.6 kg)    SpO2 96%    BMI 29.91 kg/m   Facility-Administered  Encounter Medications as of 07/23/2020  Medication   epoetin alfa (EPOGEN,PROCRIT) injection 40,000 Units   Outpatient Encounter Medications as of 07/23/2020  Medication Sig   acetaminophen (TYLENOL) 500 MG tablet Take 1 tablet (500 mg total) by mouth every 6 (six) hours as needed (pain).   albuterol (PROVENTIL) (2.5 MG/3ML) 0.083% nebulizer solution Take 3 mLs (2.5 mg total) by nebulization in the morning and at bedtime. *May use additional one time if needed for shortness of breath   albuterol (VENTOLIN HFA) 108 (90 Base) MCG/ACT inhaler Inhale 1 puff into the lungs every 6 (six) hours as needed for wheezing or shortness of breath.   allopurinol (ZYLOPRIM) 100 MG tablet Take 1 tablet (100 mg total) by mouth in the morning.   BREO ELLIPTA 100-25 MCG/INH AEPB 1 TAKE 1 INHALATION BY MOUTH ONCE DAILY   cholecalciferol (VITAMIN D3) 25 MCG (1000 UNIT) tablet Take 1 tablet (1,000 Units total) by mouth in the morning.   enoxaparin (LOVENOX) 80 MG/0.8ML injection Inject 80 mg into the skin 2 (two) times daily.   fluticasone (FLONASE) 50 MCG/ACT nasal spray Place 2 sprays into both nostrils daily.   folic acid (FOLVITE) 1 MG tablet TAKE 1 TABLET(1 MG) BY MOUTH DAILY   furosemide (LASIX) 40 MG tablet Take 40 mg by mouth daily. Start 07/13/2020   gabapentin (NEURONTIN) 300 MG capsule TAKE 1 CAPSULE(300 MG) BY MOUTH TWICE DAILY   levETIRAcetam (KEPPRA) 100 MG/ML solution Take 500 mg by mouth 2 (two) times daily. 9 am and 9 pm   Liniments (SALONPAS PAIN RELIEF PATCH EX) Apply 1 patch topically daily as needed (pain).    metoprolol tartrate (LOPRESSOR) 50 MG tablet Take 50 mg by mouth 2 (two) times daily.  9 am & 9 pm   nitroGLYCERIN (NITROSTAT) 0.4 MG SL tablet Place 1 tablet (0.4 mg total) under the tongue every 5 (five) minutes as needed for chest pain.   NON FORMULARY Diet: Soft, NAS, cons CHO   ondansetron (ZOFRAN ODT) 4 MG disintegrating tablet Take 1 tablet (4 mg total) by mouth  every 8 (eight) hours as needed for nausea or vomiting.   OXYGEN Inhale 3 L into the lungs continuous.   Polyethyl [name] (SYSTANE) 0.4-0.3 % GEL ophthalmic gel Place 1 application into the left eye at bedtime.   polyethylene glycol (MIRALAX / GLYCOLAX) 17 g packet Take 17 g by mouth daily as needed.   potassium chloride SA (KLOR-CON) 20 MEQ tablet Take 20 mEq by mouth daily.   pravastatin (PRAVACHOL) 40 MG tablet Take 1 tablet (40 mg total) by mouth daily.   senna (SENOKOT) 8.6 MG TABS tablet Take 2 tablets (17.2 mg total) by mouth daily as needed for mild constipation.   SYNTHROID 75 MCG tablet Take 1 tablet (75 mcg total) by mouth daily before breakfast.   warfarin (COUMADIN) 10 MG tablet Take 10 mg by mouth every evening. Special Instructions: INR goal 2.5-3.5 due to valve replacements    warfarin (COUMADIN) 2 MG tablet Take 2 mg by mouth every evening.   [DISCONTINUED] levETIRAcetam (KEPPRA) 500 MG tablet Take 1 tablet (500 mg total) by mouth 2 (two) times daily.   [DISCONTINUED] metoprolol succinate (TOPROL-XL) 100 MG 24 hr tablet Take 1 tablet (100 mg total) by mouth daily. TAKE 1 TABLET BY MOUTH EVERY DAY WITH FOOD OR MILK     SIGNIFICANT DIAGNOSTIC EXAMS   PREVIOUS  06-29-20 chest x-ray:  1. Stable cardiomegaly. 2. Unchanged interstitial coarsening, this is similar to multiple prior exams. Septal thickening on prior CT suggests an element of pulmonary edema. There is background emphysema on CT. 3. No new or acute airspace disease  07-03-20: ct of head:  1. No acute intracranial findings. 2. Mildly progressive atrophy and chronic small vessel ischemic changes. 3. Left sphenoid sinus disease.  07-06-20: EEG: This study showed evidence of epileptogenic city arising from left temporal region as well as mild diffuse encephalopathy, nonspecific etiology.  No seizures were seen throughout the recording.   07-08-20: chest x-ray:  Left basilar airspace disease  could be due to atelectasis or pneumonia, unchanged. Cardiomegaly without edema. Aortic Atherosclerosis  and Emphysema   NO NEW EXAMS  LABS REVIEWED PREVIOUS  06-29-20: wbc 7.8; hgb 9.9 hct 32.0; mcv 94.4 plt 210; glucose 116; bun 59; creat 1.70; k+ 5.2; na++ 134; ca 9.1 liver normal albumin 3.8  07-01-20; wbc 5.8; hgb 8.8; hct 28.9 mcv 93.8 plt 203; glucose 81; bun 55; creat 1.45; k+ 4.0; na++ 138; ca 9.3 liver normal albumin 3.2 mag 2.0; phos 3.3 07-04-20: wbc 12.3; hgb 9.3; hct 29.1; mcv 90.9 plt 182 07-07-20: wbc 6.1; hgb 8.5; hct 28.0; mcv 95.2 plt 181; glucose 90; bun 63; creat 1.38; k+ 4.0; na++ 138; ca 8.6 mag 2.1; phos 3.8  07-08-20: wbc 7.2; hgb 9.2; hct 30.7; mcv 95.6 plt 190; glucose 121; bun 65; creat 1.83; k+ 4.2; na++ 137; ca 8.9 BNP 913.0; INR 3.2 07-09-20: INR 3.6 07-13-20: INR 1.8  07-16-20; INR 1.3 on 10 mg daily  07-19-20: INR 1.4  07-22-20: INR 1.8   NO NEW LABS.   Review of Systems  Constitutional: Negative for malaise/fatigue.  Respiratory: Negative for cough and shortness of breath.   Cardiovascular: Negative for chest pain,  palpitations and leg swelling.  Gastrointestinal: Negative for abdominal pain, constipation and heartburn.  Musculoskeletal: Negative for back pain, joint pain and myalgias.  Skin: Negative.   Neurological: Negative for dizziness.  Psychiatric/Behavioral: The patient is not nervous/anxious.     Physical Exam Constitutional:      General: She is not in acute distress.    Appearance: She is well-developed. She is not diaphoretic.  Neck:     Thyroid: No thyromegaly.  Cardiovascular:     Rate and Rhythm: Normal rate and regular rhythm.     Pulses: Normal pulses.     Heart sounds: Normal heart sounds.     Comments: History of aortic and mitral valve replacements Pacemaker ICD placement  Pulmonary:     Effort: Pulmonary effort is normal. No respiratory distress.     Breath sounds: Normal breath sounds.     Comments: 02 Abdominal:      General: Bowel sounds are normal. There is no distension.     Palpations: Abdomen is soft.     Tenderness: There is no abdominal tenderness.  Musculoskeletal:        General: Normal range of motion.     Cervical back: Neck supple.     Right lower leg: No edema.     Left lower leg: No edema.  Lymphadenopathy:     Cervical: No cervical adenopathy.  Skin:    General: Skin is warm and dry.  Neurological:     Mental Status: She is alert and oriented to person, place, and time.       ASSESSMENT/ PLAN:  TODAY  1. Chronic bronchitis unspecified bronchitis type: is stable is 02 dependent will continue flonasx daily breo ellipta 100/25 mcg 1 puff daily; albuterol neb twice daily and albuterol inhaler 1 puff every 6 hours as needed  2. Seizure: no reports of further seizure activity will continue keppra 500 mg twice daily   3. CKD (chronic kidney disease) stage IV: is stable bun 65; creat 1.83 will monitor     PREVIOUS   4. Chronic anticoagulation on coumadin for mechanical AVR and MVR: INR 1.8  will continue long term coumadin at 12 mg daily; lovenox therapy until INR is therapeutic   therapy goal is 2.5-3.5.   5. Hypothyroidism postsurgical: is stable will continue synthroid 75 mcg daily   6. Hypertensive heart disease and chronic kidney disease with chronic systolic congestive heart failure and stage IV chronic kidney disease: is stable b/p 166/75 will continue toprol xl 100 mg daily   7. Hyperlipidemia associated with type 2 diabetes mellitus: is stable will continue pravachol 40 mg daily   8. Type 2 diabetes mellitus with hypoglycemia without coma without long term use of insulin: is stable will continue to monitor her status.   9. 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 Chronic constipation: is stable will continue prn miralax and senna 2 tabs  11. Hypokalemia: is stable k+ 4.2 will  continue k+ 40 meq daily   12. Acute on chronic systolic congestive heart failure: is stable EF 30-35% (01-31-20) will continue lasix 40 mg daily toprol xl 100 mg daily has prn ntg  13. CHB (complete heart block) is status post pacemaker/icd will monitor   14. Persistent atrial fibrillation heart rate is stable will continue toprol xl 100 mg daily for rate control and is on long term coumadin therapy.   MD is aware of resident's narcotic  use and is in agreement with current plan of care. We will attempt to wean resident as appropriate.  Ok Edwards NP [facility]  Contact [phone] Monday through Friday 8am- 5pm  After hours call [phone]

## 2020-07-23 NOTE — Patient Instructions (Signed)
Medication Instructions:  The current medical regimen is effective;  continue present plan and medications as directed. Please refer to the Current Medication list given to you today. *If you need a refill on your cardiac medications before your next appointment, please call your pharmacy*  Lab Work: BMET TODAY If you have labs (blood work) drawn today and your tests are completely normal, you will receive your results only by:  [facility] (if you have MyChart) OR A paper copy in the mail.  If you have any lab test that is abnormal or we need to change your treatment, we will call you to review the results. You may go to any [facility] that is convenient for you however, we do have a lab in our office that is able to assist you. You DO NOT need an appointment for our lab. The lab is open 8:00am and closes at 4:00pm. Lunch 12:45 - 1:45pm.  Testing/Procedures: NONE  Special Instructions TAKE AND LOG YOUR WEIGHT DAILY-BRING LOG WITH YOU TO FOLLOW UP APPOINTMENT IN 3 MONTHS  PLEASE READ AND FOLLOW SALTY 6-ATTACHED  Follow-Up: Your next appointment:  3 month(s) In Person with Sanda Klein, MD OR IF UNAVAILABLE JESSE CLEAVER, FNP-C   At [facility], you and your health needs are our priority.  As part of our continuing mission to provide you with exceptional heart care, we have created designated Provider Care Teams.  These Care Teams include your primary Cardiologist (physician) and Advanced Practice Providers (APPs -  Physician Assistants and Nurse Practitioners) who all work together to provide you with the care you need, when you need it.  We recommend signing up for the patient portal called "MyChart".  Sign up information is provided on this After Visit Summary.  MyChart is used to connect with patients for Virtual Visits (Telemedicine).  Patients are able to view lab/test results, encounter notes, upcoming appointments, etc.  Non-urgent messages can be sent to your provider as well.     To learn more about what you can do with MyChart, go to [email or web address].

## 2020-07-24 ENCOUNTER — Telehealth: Payer: Self-pay | Admitting: Cardiology

## 2020-07-24 ENCOUNTER — Encounter (HOSPITAL_COMMUNITY)
Admission: RE | Admit: 2020-07-24 | Discharge: 2020-07-24 | Disposition: A | Discharge status: Home / Self Care | Payer: Medicare Other | Source: Skilled Nursing Facility | Attending: Adult Health | Admitting: Adult Health

## 2020-07-24 LAB — BASIC METABOLIC PANEL
Anion gap: 7 (ref 5–15)
BUN/Creatinine Ratio: 33 — ABNORMAL HIGH (ref 12–28)
BUN: 43 mg/dL — ABNORMAL HIGH (ref 8–27)
BUN: 41 mg/dL — ABNORMAL HIGH (ref 8–23)
CO2: 24 mmol/L (ref 20–29)
CO2: 30 mmol/L (ref 22–32)
Calcium: 9.2 mg/dL (ref 8.7–10.3)
Calcium: 9 mg/dL (ref 8.9–10.3)
Chloride: 97 mmol/L (ref 96–106)
Chloride: 96 mmol/L — ABNORMAL LOW (ref 98–111)
Creatinine, Ser: 1.32 mg/dL — ABNORMAL HIGH (ref 0.57–1.00)
Creatinine, Ser: 1.27 mg/dL — ABNORMAL HIGH (ref 0.44–1.00)
GFR calc Af Amer: 42 mL/min/{1.73_m2} — ABNORMAL LOW (ref >59)
GFR calc non Af Amer: 37 mL/min/{1.73_m2} — ABNORMAL LOW (ref >59)
GFR, Estimated: 41 mL/min — ABNORMAL LOW (ref >60)
Glucose, Bld: 105 mg/dL — ABNORMAL HIGH (ref 70–99)
Glucose: 89 mg/dL (ref 65–99)
Potassium: 5.9 mmol/L (ref 3.5–5.2)
Potassium: 4.8 mmol/L (ref 3.5–5.1)
Sodium: 143 mmol/L (ref 134–144)
Sodium: 133 mmol/L — ABNORMAL LOW (ref 135–145)

## 2020-07-24 NOTE — Telephone Encounter (Signed)
Pt's K+ was 5.9 - she is now located at [facility] in [place], talked with her nurse this AM and they will draw BMP for hyperkalemia.

## 2020-07-26 ENCOUNTER — Encounter: Payer: Self-pay | Admitting: Adult Health

## 2020-07-26 ENCOUNTER — Non-Acute Institutional Stay (SKILLED_NURSING_FACILITY): Payer: Medicare Other | Admitting: Adult Health

## 2020-07-26 ENCOUNTER — Other Ambulatory Visit (HOSPITAL_COMMUNITY)
Admission: RE | Admit: 2020-07-26 | Discharge: 2020-07-26 | Disposition: A | Discharge status: Home / Self Care | Payer: Medicare Other | Source: Skilled Nursing Facility | Attending: Adult Health | Admitting: Adult Health

## 2020-07-26 DIAGNOSIS — Z952 Presence of prosthetic heart valve: Secondary | ICD-10-CM

## 2020-07-26 DIAGNOSIS — Z7901 Long term (current) use of anticoagulants: Secondary | ICD-10-CM

## 2020-07-26 DIAGNOSIS — I4819 Other persistent atrial fibrillation: Secondary | ICD-10-CM | POA: Diagnosis not present

## 2020-07-26 LAB — PROTIME-INR
INR: 2.3 — ABNORMAL HIGH (ref 0.8–1.2)
Prothrombin Time: 24.2 seconds — ABNORMAL HIGH (ref 11.4–15.2)

## 2020-07-26 NOTE — [facility] Notes (Signed)
Location:    [facility] Room Number: 151/P Place of Service:  SNF (31)   CODE STATUS: DNR  Allergies  Allergen Reactions   Penicillins Other (See Comments)    Bruise     Aspirin Other (See Comments)    On coumadin   Fentanyl Dermatitis    Rash with patch    Niacin Itching    Chief Complaint  Patient presents with   Anticoagulation    INR Visit    HPI:  Her INR today is 2.3 the goal for her INR is 2.5-3.5 due to her valve replacements. Her INR today is 2.3 she is on coumadin 12 mg daily and remains on lovenox until her INR is therapeutic. There are no reports of missed doses; no reports of missed doses. No reports of palpitations; no reports of chest pain or abnormal bleeding.   Past Medical History:  Diagnosis Date   Acute on chronic diastolic CHF (congestive heart failure) ([place]) 05/19/2012   Acute on chronic respiratory failure with hypoxia ([place]) 01/31/2020   Adenomatous polyp of colon 12/16/2002   Dr. [name]/[facility]   Allergy    Calculus of gallbladder with chronic cholecystitis without obstruction 02/09/2009   Qualifier: Diagnosis of  By: [name], Margaret     Cataract    CHB (complete heart block) ([place]) 10/07/2014       CHF (congestive heart failure) ([place])    a.  EF previously reduced to 25-30% b. EF improved to 60-65% by echo in 10/2017.    Chronic kidney disease, stage I 2006   Constipation        COPD (chronic obstructive pulmonary disease) (H[facility])        DJD (degenerative joint disease) of lumbar spine    DM (diabetes mellitus) ([place]) 2006   Esophageal reflux    Glaucoma    Gout        H/O adenomatous polyp of colon 05/24/2012   2004 in [place] colonoscopy 2008 normal     H/O heart valve replacement with mechanical valve    a. s/p mechanical AVR in 2001 and mechanical MVR in 2004.   H/O wisdom tooth extraction    HIP PAIN, RIGHT 04/23/2009   Qualifier: Diagnosis of   By: [name], Margaret     Hyperlipemia    IDA (iron deficiency anemia)    Parenteral iron/[name]   Insomnia        Nausea without vomiting 06/07/2010   Qualifier: Diagnosis of  By: [name], Margaret     Non-ischemic cardiomyopathy [facility])    a. s/p MDT CRTD   Obesity, unspecified    Oxygen deficiency 2014   nocturnal;   Papilloma of breast 03/06/2012   This was diagnosed as a left breast papilloma by ultrasound characteristics and symptoms in 2010. Because of possible medical risk factors no surgical intervention has been done and it did doing annual followups. The area has been stable by physical examination and mammograms and ultrasound since 2010.    Spinal stenosis of lumbar region 02/19/2013   Spondylolisthesis    Spondylosis    Thyroid cancer ([place])    remote thyroidectomy, no recurrence, pt denies in 2016 thyroid cancer   Type 2 diabetes mellitus with hyperglycemia ([place]) 07/15/2010   Unspecified hypothyroidism        Varicose veins of lower extremities with other complications    Vertigo         Past Surgical History:  Procedure Laterality Date   AORTIC AND MITRAL VALVE REPLACEMENT  2001   BI-VENTRICULAR IMPLANTABLE CARDIOVERTER DEFIBRILLATOR UPGRADE N/A 01/18/2015   Procedure: BI-VENTRICULAR IMPLANTABLE CARDIOVERTER DEFIBRILLATOR UPGRADE;  Surgeon: Evans Lance, MD;  Location: [facility] CATH LAB;  Service: Cardiovascular;  Laterality: N/A;   BIV ICD GENERATOR CHANGEOUT N/A 01/30/2019   Procedure: BIV ICD GENERATOR CHANGEOUT;  Surgeon: Evans Lance, MD;  Location: [facility] CV LAB;  Service: Cardiovascular;  Laterality: N/A;   CARDIAC CATHETERIZATION     CARDIAC VALVE REPLACEMENT     CARDIOVERSION N/A 11/13/2016   Procedure: CARDIOVERSION;  Surgeon: Sanda Klein, MD;  Location: [facility] ENDOSCOPY;  Service: Cardiovascular;  Laterality: N/A;   COLONOSCOPY  06/12/2007   Rourk- Normal rectum/Normal colon   COLONOSCOPY  12/16/02   small hemorrhoids     COLONOSCOPY  06/20/2012   Procedure: COLONOSCOPY;  Surgeon: Daneil Dolin, MD;  [facility];  Service: Endoscopy;  Laterality: N/A;  10:45   defibrillator implanted 2006     DOPPLER ECHOCARDIOGRAPHY  2012   DOPPLER ECHOCARDIOGRAPHY  05,06,07,08,09,2011   ESOPHAGOGASTRODUODENOSCOPY  06/12/2007   Rourk- normal esophagus, small hiatal hernia, otherwise normal stomach, D1, D2   EYE SURGERY Right 2005   EYE SURGERY Left 2006   ICD LEAD REMOVAL Left 02/08/2015   Procedure: ICD LEAD REMOVAL;  Surgeon: Evans Lance, MD;  Location: [facility];  Service: Cardiovascular;  Laterality: Left;  "Will plan extraction and insertion of a BiV PM"  **Dr. Roxan Hockey backing up case**   IMPLANTABLE CARDIOVERTER DEFIBRILLATOR (ICD) GENERATOR CHANGE Left 02/08/2015   Procedure: ICD GENERATOR CHANGE;  Surgeon: Evans Lance, MD;  Location: [facility];  Service: Cardiovascular;  Laterality: Left;   INSERT / REPLACE / REMOVE PACEMAKER     PACEMAKER INSERTION  May 2016   pacemaker placed  2004   right breast cyst removed     benign    right cataract removed     2005   TEE WITHOUT CARDIOVERSION N/A 11/13/2016   Procedure: TRANSESOPHAGEAL ECHOCARDIOGRAM (TEE);  Surgeon: Sanda Klein, MD;  Location: [facility] ENDOSCOPY;  Service: Cardiovascular;  Laterality: N/A;   THYROIDECTOMY      Social History   Socioeconomic History   Marital status: Widowed    Spouse name: Not on file   Number of children: 0   Years of education: Not on file   Highest education level: Not on file  Occupational History   Occupation: retired    Fish farm manager: RETIRED  Tobacco Use   Smoking status: Former Smoker    Packs/day: 0.75    Years: 40.00    Pack years: 30.00    Types: Cigarettes    Quit date: 03/08/1987    Years since quitting: 33.4   Smokeless tobacco: Never Used  Vaping Use   Vaping Use: Never used  Substance and Sexual Activity   Alcohol use: No    Alcohol/week: 0.0 standard drinks   Drug  use: No   Sexual activity: Not Currently  Other Topics Concern   Not on file  Social History Narrative   From [place] (2004)   Social Determinants of Health   Financial Resource Strain: Low Risk    Difficulty of Paying Living Expenses: Not hard at all  Food Insecurity: No Food Insecurity   Worried About Charity fundraiser in the Last Year: Never true   Arboriculturist in the Last Year: Never true  Transportation Needs: No Transportation Needs   Lack of Transportation (Medical):  No   Lack of Transportation (Non-Medical): No  Physical Activity: Inactive   Days of Exercise per Week: 0 days   Minutes of Exercise per Session: 0 min  Stress: No Stress Concern Present   Feeling of Stress : Only a little  Social Connections: Moderately Isolated   Frequency of Communication with Friends and Family: More than three times a week   Frequency of Social Gatherings with Friends and Family: Twice a week   Attends Religious Services: More than 4 times per year   Active Member of [employer] or Organizations: No   Attends Archivist Meetings: Never   Marital Status: Widowed  Human resources officer Violence:    Fear of Current or Ex-Partner: Not on file   Emotionally Abused: Not on file   Physically Abused: Not on file   Sexually Abused: Not on file   Family History  Problem Relation Age of Onset   Pancreatic cancer Mother    Heart disease Father    Heart disease Sister    Heart attack Sister    Heart disease Brother    Diabetes Brother    Heart disease Brother    Diabetes Brother    Hypertension Brother    Lung cancer Brother       VITAL SIGNS BP (!) 142/64    Pulse 82    Temp 98.4 F (36.9 C)    Resp 20    Ht 5\' 7"  (1.702 m)    Wt 196 lb 6.4 oz (89.1 kg)    SpO2 97%    BMI 30.76 kg/m   Facility-Administered Encounter Medications as of 07/26/2020  Medication   epoetin alfa (EPOGEN,PROCRIT) injection 40,000 Units   Outpatient Encounter  Medications as of 07/26/2020  Medication Sig   acetaminophen (TYLENOL) 500 MG tablet Take 1 tablet (500 mg total) by mouth every 6 (six) hours as needed (pain).   albuterol (PROVENTIL) (2.5 MG/3ML) 0.083% nebulizer solution Take 3 mLs (2.5 mg total) by nebulization in the morning and at bedtime. *May use additional one time if needed for shortness of breath   albuterol (VENTOLIN HFA) 108 (90 Base) MCG/ACT inhaler Inhale 1 puff into the lungs every 6 (six) hours as needed for wheezing or shortness of breath.   allopurinol (ZYLOPRIM) 100 MG tablet Take 1 tablet (100 mg total) by mouth in the morning.   BREO ELLIPTA 100-25 MCG/INH AEPB 1 TAKE 1 INHALATION BY MOUTH ONCE DAILY   cholecalciferol (VITAMIN D3) 25 MCG (1000 UNIT) tablet Take 1 tablet (1,000 Units total) by mouth in the morning.   enoxaparin (LOVENOX) 80 MG/0.8ML injection Inject 80 mg into the skin 2 (two) times daily.   fluticasone (FLONASE) 50 MCG/ACT nasal spray Place 2 sprays into both nostrils daily.   folic acid (FOLVITE) 1 MG tablet TAKE 1 TABLET(1 MG) BY MOUTH DAILY   furosemide (LASIX) 40 MG tablet Take 40 mg by mouth daily. Start 07/13/2020   gabapentin (NEURONTIN) 300 MG capsule TAKE 1 CAPSULE(300 MG) BY MOUTH TWICE DAILY   levETIRAcetam (KEPPRA) 100 MG/ML solution Take 500 mg by mouth 2 (two) times daily. 9 am and 9 pm   Liniments (SALONPAS PAIN RELIEF PATCH EX) Apply 1 patch topically daily as needed (pain).    metoprolol tartrate (LOPRESSOR) 50 MG tablet Take 50 mg by mouth 2 (two) times daily. 9 am & 9 pm   nitroGLYCERIN (NITROSTAT) 0.4 MG SL tablet Place 1 tablet (0.4 mg total) under the tongue every 5 (five) minutes as needed for  chest pain.   NON FORMULARY Diet Change: Soft diet with dys 2 (ground) meats, thin liquids (NAS, cons CHO)   ondansetron (ZOFRAN ODT) 4 MG disintegrating tablet Take 1 tablet (4 mg total) by mouth every 8 (eight) hours as needed for nausea or vomiting.   OXYGEN Inhale 3 L into  the lungs continuous.   Polyethyl [name] (SYSTANE) 0.4-0.3 % GEL ophthalmic gel Place 1 application into the left eye at bedtime.   polyethylene glycol (MIRALAX / GLYCOLAX) 17 g packet Take 17 g by mouth daily as needed.   potassium chloride SA (KLOR-CON) 20 MEQ tablet Take 20 mEq by mouth daily.   pravastatin (PRAVACHOL) 40 MG tablet Take 1 tablet (40 mg total) by mouth daily.   senna (SENOKOT) 8.6 MG TABS tablet Take 2 tablets (17.2 mg total) by mouth daily as needed for mild constipation.   SYNTHROID 75 MCG tablet Take 1 tablet (75 mcg total) by mouth daily before breakfast.   warfarin (COUMADIN) 10 MG tablet Take 10 mg by mouth every evening. Take along with 2 mg for a total dose of 12 mg Special Instructions: INR goal 2.5-3.5 due to valve replacements   warfarin (COUMADIN) 2 MG tablet Take 2 mg by mouth every evening. Take along with 10 mg for a total dose of 12 mg     SIGNIFICANT DIAGNOSTIC EXAMS   PREVIOUS  06-29-20 chest x-ray:  1. Stable cardiomegaly. 2. Unchanged interstitial coarsening, this is similar to multiple prior exams. Septal thickening on prior CT suggests an element of pulmonary edema. There is background emphysema on CT. 3. No new or acute airspace disease  07-03-20: ct of head:  1. No acute intracranial findings. 2. Mildly progressive atrophy and chronic small vessel ischemic changes. 3. Left sphenoid sinus disease.  07-06-20: EEG: This study showed evidence of epileptogenic city arising from left temporal region as well as mild diffuse encephalopathy, nonspecific etiology.  No seizures were seen throughout the recording.   07-08-20: chest x-ray:  Left basilar airspace disease could be due to atelectasis or pneumonia, unchanged. Cardiomegaly without edema. Aortic Atherosclerosis  and Emphysema   NO NEW EXAMS  LABS REVIEWED PREVIOUS  06-29-20: wbc 7.8; hgb 9.9 hct 32.0; mcv 94.4 plt 210; glucose 116; bun 59; creat 1.70; k+ 5.2; na++ 134;  ca 9.1 liver normal albumin 3.8  07-01-20; wbc 5.8; hgb 8.8; hct 28.9 mcv 93.8 plt 203; glucose 81; bun 55; creat 1.45; k+ 4.0; na++ 138; ca 9.3 liver normal albumin 3.2 mag 2.0; phos 3.3 07-04-20: wbc 12.3; hgb 9.3; hct 29.1; mcv 90.9 plt 182 07-07-20: wbc 6.1; hgb 8.5; hct 28.0; mcv 95.2 plt 181; glucose 90; bun 63; creat 1.38; k+ 4.0; na++ 138; ca 8.6 mag 2.1; phos 3.8  07-08-20: wbc 7.2; hgb 9.2; hct 30.7; mcv 95.6 plt 190; glucose 121; bun 65; creat 1.83; k+ 4.2; na++ 137; ca 8.9 BNP 913.0; INR 3.2 07-09-20: INR 3.6 07-13-20: INR 1.8  07-16-20; INR 1.3 on 10 mg daily  07-19-20: INR 1.4  07-22-20: INR 1.8   NO NEW LABS.   Review of Systems  Constitutional: Negative for malaise/fatigue.  Respiratory: Negative for cough and shortness of breath.   Cardiovascular: Negative for chest pain, palpitations and leg swelling.  Gastrointestinal: Negative for abdominal pain, constipation and heartburn.  Musculoskeletal: Negative for back pain, joint pain and myalgias.  Skin: Negative.   Neurological: Negative for dizziness.  Psychiatric/Behavioral: The patient is not nervous/anxious.     Physical Exam Constitutional:  General: She is not in acute distress.    Appearance: She is well-developed. She is not diaphoretic.  Neck:     Thyroid: No thyromegaly.  Cardiovascular:     Rate and Rhythm: Normal rate and regular rhythm.     Heart sounds: Normal heart sounds.     Comments:  History of aortic and mitral valve replacements Pacemaker ICD placement  Pulmonary:     Effort: Pulmonary effort is normal. No respiratory distress.     Breath sounds: Normal breath sounds.     Comments: 02 Abdominal:     General: Bowel sounds are normal. There is no distension.     Palpations: Abdomen is soft.     Tenderness: There is no abdominal tenderness.  Musculoskeletal:        General: Normal range of motion.     Cervical back: Neck supple.     Right lower leg: No edema.     Left lower leg: No  edema.  Lymphadenopathy:     Cervical: No cervical adenopathy.  Skin:    General: Skin is warm and dry.  Neurological:     Mental Status: She is alert and oriented to person, place, and time.  Psychiatric:        Mood and Affect: Mood normal.       ASSESSMENT/ PLAN:  TODAY  1. Persistent atrial fibrillation 2. Status post MVR/AVR 3. Chronic anticoagulation   For her INR of 2.3  Will give coumadin 16 mg for 2 days then return to 12 mg daily  Will continue lovenox until INR 2.5-3.5 Will check INR on 08-02-20     MD is aware of resident's narcotic use and is in agreement with current plan of care. We will attempt to wean resident as appropriate.  Ok Edwards NP [facility]  Contact [phone] Monday through Friday 8am- 5pm  After hours call [phone]

## 2020-07-27 ENCOUNTER — Other Ambulatory Visit (HOSPITAL_COMMUNITY)
Admission: RE | Admit: 2020-07-27 | Discharge: 2020-07-27 | Disposition: A | Discharge status: Home / Self Care | Payer: Medicare Other | Source: Skilled Nursing Facility | Attending: Adult Health | Admitting: Adult Health

## 2020-07-27 ENCOUNTER — Other Ambulatory Visit: Payer: Self-pay | Admitting: Family Medicine

## 2020-07-27 DIAGNOSIS — Z7901 Long term (current) use of anticoagulants: Secondary | ICD-10-CM | POA: Insufficient documentation

## 2020-07-27 LAB — PROTIME-INR
INR: 2.3 — ABNORMAL HIGH (ref 0.8–1.2)
Prothrombin Time: 24.4 seconds — ABNORMAL HIGH (ref 11.4–15.2)

## 2020-07-28 ENCOUNTER — Telehealth: Payer: Self-pay | Admitting: Family Medicine

## 2020-07-28 NOTE — Telephone Encounter (Signed)
Patient is currently a patient at [facility] and is due to be discharged soon but feels she needs more time and would like to have [place] speak with her social worker in regards to this [phone]

## 2020-07-29 ENCOUNTER — Encounter: Payer: Self-pay | Admitting: Adult Health

## 2020-07-29 ENCOUNTER — Non-Acute Institutional Stay (SKILLED_NURSING_FACILITY): Payer: Medicare Other | Admitting: Adult Health

## 2020-07-29 DIAGNOSIS — E890 Postprocedural hypothyroidism: Secondary | ICD-10-CM | POA: Diagnosis not present

## 2020-07-29 DIAGNOSIS — E785 Hyperlipidemia, unspecified: Secondary | ICD-10-CM

## 2020-07-29 DIAGNOSIS — I5022 Chronic systolic (congestive) heart failure: Secondary | ICD-10-CM | POA: Diagnosis not present

## 2020-07-29 DIAGNOSIS — E1169 Type 2 diabetes mellitus with other specified complication: Secondary | ICD-10-CM | POA: Diagnosis not present

## 2020-07-29 DIAGNOSIS — I130 Hypertensive heart and chronic kidney disease with heart failure and stage 1 through stage 4 chronic kidney disease, or unspecified chronic kidney disease: Secondary | ICD-10-CM | POA: Diagnosis not present

## 2020-07-29 DIAGNOSIS — N184 Chronic kidney disease, stage 4 (severe): Secondary | ICD-10-CM

## 2020-07-29 NOTE — Telephone Encounter (Signed)
I spoke directly with Ms Altemose, the NP caring for her and her social worker Cristie Hem on 09/28/2019. I also directly sent a message to her niece Polo Riley

## 2020-07-29 NOTE — Progress Notes (Signed)
Location:    [facility] Room Number: 151/P Place of Service:  SNF (31)   CODE STATUS: DNR  Allergies  Allergen Reactions  . Penicillins Other (See Comments)    Bruise    . Aspirin Other (See Comments)    On coumadin  . Fentanyl Dermatitis    Rash with patch   . Niacin Itching    Chief Complaint  Patient presents with  . Short Term Rehab (STR)         Hypothyroidism postsurgical:   Hypertensive heart disease and chronic kidney disease with chronic systolic congestive heart failure and stage IV chronic kidney disease:    hyperlipidemia associated with type 2 diabetes mellitus   Weekly follow up for the first 30 days post hospitalization     HPI:  She is a 86 year old short term rehab patient being seen for the management of her chronic illnesses: Hypothyroidism postsurgical:   Hypertensive heart disease and chronic kidney disease with chronic systolic congestive heart failure and stage IV chronic kidney disease:    hyperlipidemia associated with type 2 diabetes mellitus . There are no reports of uncontrolled pain; no changes in appetite; no reports of anxiety or depressive thoughts.   Past Medical History:  Diagnosis Date  . Acute on chronic diastolic CHF (congestive heart failure) ([place]) 05/19/2012  . Acute on chronic respiratory failure with hypoxia ([place]) 01/31/2020  . Adenomatous polyp of colon 12/16/2002   Dr. [name]/[facility]  . Allergy   . Calculus of gallbladder with chronic cholecystitis without obstruction 02/09/2009   Qualifier: Diagnosis of  By: [name], [name]    . Cataract   . CHB (complete heart block) ([place]) 10/07/2014      . CHF (congestive heart failure) ([place])    a.  EF previously reduced to 25-30% b. EF improved to 60-65% by echo in 10/2017.   Marland Kitchen Chronic kidney disease, stage I 2006  . Constipation       . COPD (chronic obstructive pulmonary disease) ([place])       . DJD (degenerative joint disease)  of lumbar spine   . DM (diabetes mellitus) ([place]) 2006  . Esophageal reflux   . Glaucoma   . Gout       . H/O adenomatous polyp of colon 05/24/2012   2004 in [place] colonoscopy 2008 normal    . H/O heart valve replacement with mechanical valve    a. s/p mechanical AVR in 2001 and mechanical MVR in 2004.  Marland Kitchen H/O wisdom tooth extraction   . HIP PAIN, RIGHT 04/23/2009   Qualifier: Diagnosis of  By: [name], [name]    . Hyperlipemia   . IDA (iron deficiency anemia)    Parenteral iron/[name]  . Insomnia       . Nausea without vomiting 06/07/2010   Qualifier: Diagnosis of  By: [name], [name]    . Non-ischemic cardiomyopathy ([place])    a. s/p MDT CRTD  . Obesity, unspecified   . Oxygen deficiency 2014   nocturnal;  . Papilloma of breast 03/06/2012   This was diagnosed as a left breast papilloma by ultrasound characteristics and symptoms in 2010. Because of possible medical risk factors no surgical intervention has been done and it did doing annual followups. The area has been stable by physical examination and mammograms and ultrasound since 2010.   Marland Kitchen Spinal stenosis of lumbar region 02/19/2013  . Spondylolisthesis   . Spondylosis   .  Thyroid cancer ([place])    remote thyroidectomy, no recurrence, pt denies in 2016 thyroid cancer  . Type 2 diabetes mellitus with hyperglycemia ([place]) 07/15/2010  . Unspecified hypothyroidism       . 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 Vertigo         Past Surgical History:  Procedure Laterality Date  . AORTIC AND MITRAL VALVE REPLACEMENT  2001  . BI-VENTRICULAR IMPLANTABLE CARDIOVERTER DEFIBRILLATOR UPGRADE N/A 01/18/2015   Procedure: BI-VENTRICULAR IMPLANTABLE CARDIOVERTER DEFIBRILLATOR UPGRADE;  Surgeon: Evans Lance, MD;  Location[facility] CATH LAB;  Service: Cardiovascular;  Laterality: N/A;  . BIV ICD GENERATOR CHANGEOUT N/A 01/30/2019   Procedure: BIV ICD GENERATOR CHANGEOUT;  Surgeon: Evans Lance, MD;   Location: [facility] CV LAB;  Service: Cardiovascular;  Laterality: N/A;  . CARDIAC CATHETERIZATION    . CARDIAC VALVE REPLACEMENT    . CARDIOVERSION N/A 11/13/2016   Procedure: CARDIOVERSION;  Surgeon: Sanda Klein, MD;  Location: [facility] ENDOSCOPY;  Service: Cardiovascular;  Laterality: N/A;  . COLONOSCOPY  06/12/2007   Rourk- Normal rectum/Normal colon  . COLONOSCOPY  12/16/02   small hemorrhoids  . COLONOSCOPY  06/20/2012   Procedure: COLONOSCOPY;  Surgeon: Daneil Dolin, MD;  Location: [facility] ENDO SUITE;  Service: Endoscopy;  Laterality: N/A;  10:45  . defibrillator implanted 2006    . DOPPLER ECHOCARDIOGRAPHY  2012  . DOPPLER ECHOCARDIOGRAPHY  05,06,07,08,09,2011  . ESOPHAGOGASTRODUODENOSCOPY  06/12/2007   Rourk- normal esophagus, small hiatal hernia, otherwise normal stomach, D1, D2  . EYE SURGERY Right 2005  . EYE SURGERY Left 2006  . ICD LEAD REMOVAL Left 02/08/2015   Procedure: ICD LEAD REMOVAL;  Surgeon: Evans Lance, MD;  Location: [facility] OR;  Service: Cardiovascular;  Laterality: Left;  "Will plan extraction and insertion of a BiV PM"  **Dr. Roxan Hockey backing up case**  . IMPLANTABLE CARDIOVERTER DEFIBRILLATOR (ICD) GENERATOR CHANGE Left 02/08/2015   Procedure: ICD GENERATOR CHANGE;  Surgeon: Evans Lance, MD;  Location: [place];  Service: Cardiovascular;  Laterality: Left;  . INSERT / REPLACE / REMOVE PACEMAKER    . PACEMAKER INSERTION  May 2016  . pacemaker placed  2004  . right breast cyst removed     benign   . right cataract removed     2005  . TEE WITHOUT CARDIOVERSION N/A 11/13/2016   Procedure: TRANSESOPHAGEAL ECHOCARDIOGRAM (TEE);  Surgeon: Sanda Klein, MD;  Location: [facility] ENDOSCOPY;  Service: Cardiovascular;  Laterality: N/A;  . THYROIDECTOMY      Social History   Socioeconomic History  . Marital status: Widowed    Spouse name: Not on file  . Number of children: 0  . Years of education: Not on file  . Highest education level: Not on file  Occupational History   . Occupation: retired    Fish farm manager: RETIRED  Tobacco Use  . Smoking status: Former Smoker    Packs/day: 0.75    Years: 40.00    Pack years: 30.00    Types: Cigarettes    Quit date: 03/08/1987    Years since quitting: 33.4  . Smokeless tobacco: Never Used  Vaping Use  . Vaping Use: Never used  Substance and Sexual Activity  . Alcohol use: No    Alcohol/week: 0.0 standard drinks  . Drug use: No  . Sexual activity: Not Currently  Other Topics Concern  . Not on file  Social History Narrative   From [place] (2004)   Social Determinants of Health   Financial  Resource Strain: Low Risk   . Difficulty of Paying Living Expenses: Not hard at all  Food Insecurity: No Food Insecurity  . Worried About Charity fundraiser in the Last Year: Never true  . Ran Out of Food in the Last Year: Never true  Transportation Needs: No Transportation Needs  . Lack of Transportation (Medical): No  . Lack of Transportation (Non-Medical): No  Physical Activity: Inactive  . Days of Exercise per Week: 0 days  . Minutes of Exercise per Session: 0 min  Stress: No Stress Concern Present  . Feeling of Stress : Only a little  Social Connections: Moderately Isolated  . Frequency of Communication with Friends and Family: More than three times a week  . Frequency of Social Gatherings with Friends and Family: Twice a week  . Attends Religious Services: More than 4 times per year  . Active Member of Clubs or Organizations: No  . Attends Archivist Meetings: Never  . Marital Status: Widowed  Intimate Partner Violence:   . Fear of Current or Ex-Partner: Not on file  . Emotionally Abused: Not on file  . Physically Abused: Not on file  . Sexually Abused: Not on file   Family History  Problem Relation Age of Onset  . Pancreatic cancer Mother   . Heart disease Father   . Heart disease Sister   . Heart attack Sister   . Heart disease Brother   . Diabetes Brother   . Heart disease Brother   .  Diabetes Brother   . Hypertension Brother   . Lung cancer Brother       VITAL SIGNS BP (!) 142/64   Pulse 64   Temp 98.8 F (37.1 C)   Resp 20   Ht 5\' 7"  (1.702 m)   Wt 199 lb 12.8 oz (90.6 kg)   SpO2 92%   BMI 31.29 kg/m   Facility-Administered Encounter Medications as of 07/29/2020  Medication  . epoetin alfa (EPOGEN,PROCRIT) injection 40,000 Units   Outpatient Encounter Medications as of 07/29/2020  Medication Sig  . acetaminophen (TYLENOL) 500 MG tablet Take 1 tablet (500 mg total) by mouth every 6 (six) hours as needed (pain).  Marland Kitchen albuterol (PROVENTIL) (2.5 MG/3ML) 0.083% nebulizer solution Take 3 mLs (2.5 mg total) by nebulization in the morning and at bedtime. *May use additional one time if needed for shortness of breath  . albuterol (VENTOLIN HFA) 108 (90 Base) MCG/ACT inhaler Inhale 1 puff into the lungs every 6 (six) hours as needed for wheezing or shortness of breath.  . allopurinol (ZYLOPRIM) 100 MG tablet TAKE 1 TABLET(100 MG) BY MOUTH DAILY  . BREO ELLIPTA 100-25 MCG/INH AEPB 1 TAKE 1 INHALATION BY MOUTH ONCE DAILY  . cholecalciferol (VITAMIN D3) 25 MCG (1000 UNIT) tablet Take 1 tablet (1,000 Units total) by mouth in the morning.  . enoxaparin (LOVENOX) 80 MG/0.8ML injection Inject 80 mg into the skin 2 (two) times daily.  . fluticasone (FLONASE) 50 MCG/ACT nasal spray Place 2 sprays into both nostrils daily.  . folic acid (FOLVITE) 1 MG tablet TAKE 1 TABLET(1 MG) BY MOUTH DAILY  . furosemide (LASIX) 40 MG tablet Take 40 mg by mouth daily. Start 07/13/2020  . gabapentin (NEURONTIN) 300 MG capsule TAKE 1 CAPSULE(300 MG) BY MOUTH TWICE DAILY  . levETIRAcetam (KEPPRA) 100 MG/ML solution Take 500 mg by mouth 2 (two) times daily. 9 am and 9 pm  . Liniments (SALONPAS PAIN RELIEF PATCH EX) Apply 1 patch topically daily as  needed (pain).   . metoprolol tartrate (LOPRESSOR) 50 MG tablet Take 50 mg by mouth 2 (two) times daily. 9 am & 9 pm  . nitroGLYCERIN (NITROSTAT) 0.4 MG  SL tablet Place 1 tablet (0.4 mg total) under the tongue every 5 (five) minutes as needed for chest pain.  . NON FORMULARY Diet Change: Soft diet with dys 2 (ground) meats, thin liquids (NAS, cons CHO)  . ondansetron (ZOFRAN ODT) 4 MG disintegrating tablet Take 1 tablet (4 mg total) by mouth every 8 (eight) hours as needed for nausea or vomiting.  . OXYGEN Inhale 3 L into the lungs continuous.  [name] [name] (SYSTANE) 0.4-0.3 % GEL ophthalmic gel Place 1 application into the left eye at bedtime.  . polyethylene glycol (MIRALAX / GLYCOLAX) 17 g packet Take 17 g by mouth daily as needed.  . potassium chloride SA (KLOR-CON) 20 MEQ tablet Take 20 mEq by mouth daily.  . pravastatin (PRAVACHOL) 40 MG tablet Take 1 tablet (40 mg total) by mouth daily.  Marland Kitchen senna (SENOKOT) 8.6 MG TABS tablet Take 2 tablets (17.2 mg total) by mouth daily as needed for mild constipation.  Marland Kitchen SYNTHROID 75 MCG tablet Take 1 tablet (75 mcg total) by mouth daily before breakfast.  . warfarin (COUMADIN) 10 MG tablet Take 10 mg by mouth every evening. Take along with 2 mg for a total dose of 12 mg Special Instructions: INR goal 2.5-3.5 due to valve replacements  . warfarin (COUMADIN) 2 MG tablet Take 2 mg by mouth every evening. Take along with 10 mg for a total dose of 12 mg     SIGNIFICANT DIAGNOSTIC EXAMS    PREVIOUS  06-29-20 chest x-ray:  1. Stable cardiomegaly. 2. Unchanged interstitial coarsening, this is similar to multiple prior exams. Septal thickening on prior CT suggests an element of pulmonary edema. There is background emphysema on CT. 3. No new or acute airspace disease  07-03-20: ct of head:  1. No acute intracranial findings. 2. Mildly progressive atrophy and chronic small vessel ischemic changes. 3. Left sphenoid sinus disease.  07-06-20: EEG: This study showed evidence of epileptogenic city arising from left temporal region as well as mild diffuse encephalopathy, nonspecific etiology.   No seizures were seen throughout the recording.   07-08-20: chest x-ray:  Left basilar airspace disease could be due to atelectasis or pneumonia, unchanged. Cardiomegaly without edema. Aortic Atherosclerosis  and Emphysema   NO NEW EXAMS  LABS REVIEWED PREVIOUS  06-29-20: wbc 7.8; hgb 9.9 hct 32.0; mcv 94.4 plt 210; glucose 116; bun 59; creat 1.70; k+ 5.2; na++ 134; ca 9.1 liver normal albumin 3.8  07-01-20; wbc 5.8; hgb 8.8; hct 28.9 mcv 93.8 plt 203; glucose 81; bun 55; creat 1.45; k+ 4.0; na++ 138; ca 9.3 liver normal albumin 3.2 mag 2.0; phos 3.3 07-04-20: wbc 12.3; hgb 9.3; hct 29.1; mcv 90.9 plt 182 07-07-20: wbc 6.1; hgb 8.5; hct 28.0; mcv 95.2 plt 181; glucose 90; bun 63; creat 1.38; k+ 4.0; na++ 138; ca 8.6 mag 2.1; phos 3.8  07-08-20: wbc 7.2; hgb 9.2; hct 30.7; mcv 95.6 plt 190; glucose 121; bun 65; creat 1.83; k+ 4.2; na++ 137; ca 8.9 BNP 913.0; INR 3.2 07-09-20: INR 3.6 07-13-20: INR 1.8  07-16-20; INR 1.3 on 10 mg daily  07-19-20: INR 1.4  07-22-20: INR 1.8   TODAY  07-26-20: INR 2.3  07-27-20: INR 2.3    Review of Systems  Constitutional: Negative for malaise/fatigue.  Respiratory: Negative for cough and shortness  of breath.   Cardiovascular: Negative for chest pain, palpitations and leg swelling.  Gastrointestinal: Negative for abdominal pain, constipation and heartburn.  Musculoskeletal: Negative for back pain, joint pain and myalgias.  Skin: Negative.   Neurological: Negative for dizziness.  Psychiatric/Behavioral: The patient is not nervous/anxious.     Physical Exam Constitutional:      General: She is not in acute distress.    Appearance: She is well-developed. She is not diaphoretic.  Neck:     Thyroid: No thyromegaly.  Cardiovascular:     Rate and Rhythm: Normal rate and regular rhythm.     Heart sounds: Normal heart sounds.     Comments:  History of aortic and mitral valve replacements Pacemaker ICD placement  Pulmonary:     Effort: Pulmonary  effort is normal. No respiratory distress.     Breath sounds: Normal breath sounds.     Comments: 02 Abdominal:     General: Bowel sounds are normal. There is no distension.     Palpations: Abdomen is soft.     Tenderness: There is no abdominal tenderness.  Musculoskeletal:        General: Normal range of motion.     Cervical back: Neck supple.     Right lower leg: No edema.     Left lower leg: No edema.  Lymphadenopathy:     Cervical: No cervical adenopathy.  Skin:    General: Skin is warm and dry.  Neurological:     Mental Status: She is alert and oriented to person, place, and time.  Psychiatric:        Mood and Affect: Mood normal.      ASSESSMENT/ PLAN:  TODAY  1. Hypothyroidism postsurgical: is stable will continue synthroid 75 mcg daily   2. Hypertensive heart disease and chronic kidney disease with chronic systolic congestive heart failure and stage IV chronic kidney disease: is stable b/p 142/64 will continue toprol xl 100 mg daily   3.hyperlipidemia associated with type 2 diabetes mellitus: is stable will continue pravachol 40 mg daily    PREVIOUS   4. Chronic anticoagulation on coumadin for mechanical AVR and MVR: INR 2.3  will continue long term coumadin at 12 mg daily; lovenox therapy until INR is therapeutic   therapy goal is 2.5-3.5.   5. Type 2 diabetes mellitus with hypoglycemia without coma without long term use of insulin: is stable will continue to monitor her status.   6. 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 Chronic constipation: is stable will continue prn miralax and senna 2 tabs  8. Hypokalemia: is stable k+ 4.2 will continue k+ 40 meq daily   9. Acute on chronic systolic congestive heart failure: is stable EF 30-35% (01-31-20) will continue lasix 40 mg daily toprol xl 100 mg daily has prn ntg  10. CHB (complete heart block) is status post pacemaker/icd will  monitor   11. Persistent atrial fibrillation heart rate is stable will continue toprol xl 100 mg daily for rate control and is on long term coumadin therapy.  12. Chronic bronchitis unspecified bronchitis type: is stable is 02 dependent will continue flonasx daily breo ellipta 100/25 mcg 1 puff daily; albuterol neb twice daily and albuterol inhaler 1 puff every 6 hours as needed  13. Seizure: no reports of further seizure activity will continue keppra 500 mg twice daily   14. CKD (chronic kidney disease) stage IV: is stable bun  65; creat 1.83 will monitor         MD is aware of resident's narcotic use and is in agreement with current plan of care. We will attempt to wean resident as appropriate.  Ok Edwards NP [facility]  Contact [phone] Monday through Friday 8am- 5pm  After hours call [phone]

## 2020-08-02 ENCOUNTER — Encounter (HOSPITAL_COMMUNITY)
Admission: RE | Admit: 2020-08-02 | Discharge: 2020-08-02 | Disposition: A | Discharge status: Home / Self Care | Payer: Medicare Other | Source: Skilled Nursing Facility | Attending: Adult Health | Admitting: Adult Health

## 2020-08-02 ENCOUNTER — Non-Acute Institutional Stay (SKILLED_NURSING_FACILITY): Payer: Medicare Other | Admitting: Adult Health

## 2020-08-02 ENCOUNTER — Encounter: Payer: Self-pay | Admitting: Adult Health

## 2020-08-02 DIAGNOSIS — Z7901 Long term (current) use of anticoagulants: Secondary | ICD-10-CM

## 2020-08-02 DIAGNOSIS — Z952 Presence of prosthetic heart valve: Secondary | ICD-10-CM

## 2020-08-02 DIAGNOSIS — I4819 Other persistent atrial fibrillation: Secondary | ICD-10-CM | POA: Diagnosis not present

## 2020-08-02 LAB — PROTIME-INR
INR: 3.3 — ABNORMAL HIGH (ref 0.8–1.2)
Prothrombin Time: 32.3 seconds — ABNORMAL HIGH (ref 11.4–15.2)

## 2020-08-02 NOTE — Progress Notes (Signed)
No ICM remote transmission received for 07/26/2020 and next ICM transmission scheduled for 08/30/2020.  Patient currently in SNF.

## 2020-08-02 NOTE — Progress Notes (Signed)
Location:    [facility] Room Number: 151/P Place of Service:  SNF (31)   CODE STATUS: DNR  Allergies  Allergen Reactions  . Penicillins Other (See Comments)    Bruise    . Aspirin Other (See Comments)    On coumadin  . Fentanyl Dermatitis    Rash with patch   . Niacin Itching    Chief Complaint  Patient presents with  . Anticoagulation    INR Visit    HPI:  Her INR today is 3.3 the goal is 2.5-3.5. she continues on coumadin 12 mg nightly and is taking lovenox injections as well. There are no reports of missed doses. There are no reports of bloody stools. There are no reports of uncontrolled pain. No reports of shortness of breath or chest pain.   Past Medical History:  Diagnosis Date  . Acute on chronic diastolic CHF (congestive heart failure) ([place]) 05/19/2012  . Acute on chronic respiratory failure with hypoxia ([place]) 01/31/2020  . Adenomatous polyp of colon 12/16/2002   Dr. [name]/[facility]  . Allergy   . Calculus of gallbladder with chronic cholecystitis without obstruction 02/09/2009   Qualifier: Diagnosis of  By: [name], [name]    . Cataract   . CHB (complete heart block) ([place]) 10/07/2014      . CHF (congestive heart failure) ([place])    a.  EF previously reduced to 25-30% b. EF improved to 60-65% by echo in 10/2017.   Marland Kitchen Chronic kidney disease, stage I 2006  . Constipation       . COPD (chronic obstructive pulmonary disease) ([place])       . DJD (degenerative joint disease) of lumbar spine   . DM (diabetes mellitus) ([place]) 2006  . Esophageal reflux   . Glaucoma   . Gout       . H/O adenomatous polyp of colon 05/24/2012   2004 in [place] colonoscopy 2008 normal    . H/O heart valve replacement with mechanical valve    a. s/p mechanical AVR in 2001 and mechanical MVR in 2004.  Marland Kitchen H/O wisdom tooth extraction   . HIP PAIN, RIGHT 04/23/2009   Qualifier: Diagnosis of  By: [name], [name]    .  Hyperlipemia   . IDA (iron deficiency anemia)    Parenteral iron/[name]  . Insomnia       . Nausea without vomiting 06/07/2010   Qualifier: Diagnosis of  By: [name], [name]    . Non-ischemic cardiomyopathy ([place])    a. s/p MDT CRTD  . Obesity, unspecified   . Oxygen deficiency 2014   nocturnal;  . Papilloma of breast 03/06/2012   This was diagnosed as a left breast papilloma by ultrasound characteristics and symptoms in 2010. Because of possible medical risk factors no surgical intervention has been done and it did doing annual followups. The area has been stable by physical examination and mammograms and ultrasound since 2010.   Marland Kitchen Spinal stenosis of lumbar region 02/19/2013  . Spondylolisthesis   . Spondylosis   . Thyroid cancer ([place])    remote thyroidectomy, no recurrence, pt denies in 2016 thyroid cancer  . Type 2 diabetes mellitus with hyperglycemia ([place]) 07/15/2010  . Unspecified hypothyroidism       . 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 Vertigo         Past Surgical History:  Procedure Laterality Date  . AORTIC AND MITRAL  VALVE REPLACEMENT  2001  . BI-VENTRICULAR IMPLANTABLE CARDIOVERTER DEFIBRILLATOR UPGRADE N/A 01/18/2015   Procedure: BI-VENTRICULAR IMPLANTABLE CARDIOVERTER DEFIBRILLATOR UPGRADE;  Surgeon: Evans Lance, MD;  Location: [facility] CATH LAB;  Service: Cardiovascular;  Laterality: N/A;  . BIV ICD GENERATOR CHANGEOUT N/A 01/30/2019   Procedure: BIV ICD GENERATOR CHANGEOUT;  Surgeon: Evans Lance, MD;  Location: [facility] CV LAB;  Service: Cardiovascular;  Laterality: N/A;  . CARDIAC CATHETERIZATION    . CARDIAC VALVE REPLACEMENT    . CARDIOVERSION N/A 11/13/2016   Procedure: CARDIOVERSION;  Surgeon: Sanda Klein, MD;  Location: [facility] ENDOSCOPY;  Service: Cardiovascular;  Laterality: N/A;  . COLONOSCOPY  06/12/2007   Rourk- Normal rectum/Normal colon  . COLONOSCOPY  12/16/02   small hemorrhoids  . COLONOSCOPY  06/20/2012    Procedure: COLONOSCOPY;  Surgeon: Daneil Dolin, MD;  Location: [facility] ENDO SUITE;  Service: Endoscopy;  Laterality: N/A;  10:45  . defibrillator implanted 2006    . DOPPLER ECHOCARDIOGRAPHY  2012  . DOPPLER ECHOCARDIOGRAPHY  05,06,07,08,09,2011  . ESOPHAGOGASTRODUODENOSCOPY  06/12/2007   Rourk- normal esophagus, small hiatal hernia, otherwise normal stomach, D1, D2  . EYE SURGERY Right 2005  . EYE SURGERY Left 2006  . ICD LEAD REMOVAL Left 02/08/2015   Procedure: ICD LEAD REMOVAL;  Surgeon: Evans Lance, MD;  Location: [facility] OR;  Service: Cardiovascular;  Laterality: Left;  "Will plan extraction and insertion of a BiV PM"  **Dr. Roxan Hockey backing up case**  . IMPLANTABLE CARDIOVERTER DEFIBRILLATOR (ICD) GENERATOR CHANGE Left 02/08/2015   Procedure: ICD GENERATOR CHANGE;  Surgeon: Evans Lance, MD;  Location: [place];  Service: Cardiovascular;  Laterality: Left;  . INSERT / REPLACE / REMOVE PACEMAKER    . PACEMAKER INSERTION  May 2016  . pacemaker placed  2004  . right breast cyst removed     benign   . right cataract removed     2005  . TEE WITHOUT CARDIOVERSION N/A 11/13/2016   Procedure: TRANSESOPHAGEAL ECHOCARDIOGRAM (TEE);  Surgeon: Sanda Klein, MD;  Location: [facility] ENDOSCOPY;  Service: Cardiovascular;  Laterality: N/A;  . THYROIDECTOMY      Social History   Socioeconomic History  . Marital status: Widowed    Spouse name: Not on file  . Number of children: 0  . Years of education: Not on file  . Highest education level: Not on file  Occupational History  . Occupation: retired    Fish farm manager: RETIRED  Tobacco Use  . Smoking status: Former Smoker    Packs/day: 0.75    Years: 40.00    Pack years: 30.00    Types: Cigarettes    Quit date: 03/08/1987    Years since quitting: 33.4  . Smokeless tobacco: Never Used  Vaping Use  . Vaping Use: Never used  Substance and Sexual Activity  . Alcohol use: No    Alcohol/week: 0.0 standard drinks  . Drug use: No  . Sexual activity: Not  Currently  Other Topics Concern  . Not on file  Social History Narrative   From [place] (2004)   Social Determinants of Health   Financial Resource Strain: Low Risk   . Difficulty of Paying Living Expenses: Not hard at all  Food Insecurity: No Food Insecurity  . Worried About Charity fundraiser in the Last Year: Never true  . Ran Out of Food in the Last Year: Never true  Transportation Needs: No Transportation Needs  . Lack of Transportation (Medical): No  . Lack of Transportation (Non-Medical): No  Physical Activity: Inactive  . Days of Exercise per Week: 0 days  . Minutes of Exercise per Session: 0 min  Stress: No Stress Concern Present  . Feeling of Stress : Only a little  Social Connections: Moderately Isolated  . Frequency of Communication with Friends and Family: More than three times a week  . Frequency of Social Gatherings with Friends and Family: Twice a week  . Attends Religious Services: More than 4 times per year  . Active Member of Clubs or Organizations: No  . Attends Archivist Meetings: Never  . Marital Status: Widowed  Intimate Partner Violence:   . Fear of Current or Ex-Partner: Not on file  . Emotionally Abused: Not on file  . Physically Abused: Not on file  . Sexually Abused: Not on file   Family History  Problem Relation Age of Onset  . Pancreatic cancer Mother   . Heart disease Father   . Heart disease Sister   . Heart attack Sister   . Heart disease Brother   . Diabetes Brother   . Heart disease Brother   . Diabetes Brother   . Hypertension Brother   . Lung cancer Brother       VITAL SIGNS BP 134/67   Pulse (!) 59   Temp 97.6 F (36.4 C)   Resp 20   Ht 5\' 7"  (1.702 m)   Wt 193 lb 14.4 oz (88 kg)   SpO2 91%   BMI 30.37 kg/m   Facility-Administered Encounter Medications as of 08/02/2020  Medication  . epoetin alfa (EPOGEN,PROCRIT) injection 40,000 Units   Outpatient Encounter Medications as of 08/02/2020  Medication  Sig  . acetaminophen (TYLENOL) 500 MG tablet Take 1 tablet (500 mg total) by mouth every 6 (six) hours as needed (pain).  Marland Kitchen albuterol (PROVENTIL) (2.5 MG/3ML) 0.083% nebulizer solution Take 3 mLs (2.5 mg total) by nebulization in the morning and at bedtime. *May use additional one time if needed for shortness of breath  . albuterol (VENTOLIN HFA) 108 (90 Base) MCG/ACT inhaler Inhale 1 puff into the lungs every 6 (six) hours as needed for wheezing or shortness of breath.  . allopurinol (ZYLOPRIM) 100 MG tablet TAKE 1 TABLET(100 MG) BY MOUTH DAILY  . BREO ELLIPTA 100-25 MCG/INH AEPB 1 TAKE 1 INHALATION BY MOUTH ONCE DAILY  . cholecalciferol (VITAMIN D3) 25 MCG (1000 UNIT) tablet Take 1 tablet (1,000 Units total) by mouth in the morning.  . fluticasone (FLONASE) 50 MCG/ACT nasal spray Place 2 sprays into both nostrils daily.  . folic acid (FOLVITE) 1 MG tablet TAKE 1 TABLET(1 MG) BY MOUTH DAILY  . furosemide (LASIX) 40 MG tablet Take 40 mg by mouth daily. Start 07/13/2020  . gabapentin (NEURONTIN) 300 MG capsule TAKE 1 CAPSULE(300 MG) BY MOUTH TWICE DAILY  . levETIRAcetam (KEPPRA) 100 MG/ML solution Take 500 mg by mouth 2 (two) times daily. 9 am and 9 pm  . Liniments (SALONPAS PAIN RELIEF PATCH EX) Apply 1 patch topically daily as needed (pain).   . metoprolol tartrate (LOPRESSOR) 50 MG tablet Take 50 mg by mouth 2 (two) times daily. 9 am & 9 pm  . nitroGLYCERIN (NITROSTAT) 0.4 MG SL tablet Place 1 tablet (0.4 mg total) under the tongue every 5 (five) minutes as needed for chest pain.  . NON FORMULARY Diet Change: Soft diet with dys 2 (ground) meats, thin liquids (NAS, cons CHO)  . ondansetron (ZOFRAN ODT) 4 MG disintegrating tablet Take 1 tablet (4 mg total) by mouth  every 8 (eight) hours as needed for nausea or vomiting.  . OXYGEN Inhale 3 L into the lungs continuous.  [name] [name] (SYSTANE) 0.4-0.3 % GEL ophthalmic gel Place 1 application into the left eye at bedtime.  .  polyethylene glycol (MIRALAX / GLYCOLAX) 17 g packet Take 17 g by mouth daily as needed.  . potassium chloride SA (KLOR-CON) 20 MEQ tablet Take 20 mEq by mouth daily.  . pravastatin (PRAVACHOL) 40 MG tablet Take 1 tablet (40 mg total) by mouth daily.  Marland Kitchen senna (SENOKOT) 8.6 MG TABS tablet Take 2 tablets (17.2 mg total) by mouth daily as needed for mild constipation.  Marland Kitchen SYNTHROID 75 MCG tablet Take 1 tablet (75 mcg total) by mouth daily before breakfast.  . warfarin (COUMADIN) 10 MG tablet Take 10 mg by mouth every evening. Take along with 2 mg for a total dose of 17 mg Special Instructions: INR goal 2.5-3.5 due to valve replacements  . warfarin (COUMADIN) 2 MG tablet Take 2 mg by mouth every evening. Take along with 10 mg for a total dose of 17 mg  . warfarin (COUMADIN) 5 MG tablet Take 5 mg by mouth every evening. Take along with 2 mg and 10 mg to have a total of 17 mg  . [DISCONTINUED] enoxaparin (LOVENOX) 80 MG/0.8ML injection Inject 80 mg into the skin 2 (two) times daily.     SIGNIFICANT DIAGNOSTIC EXAMS  PREVIOUS  06-29-20 chest x-ray:  1. Stable cardiomegaly. 2. Unchanged interstitial coarsening, this is similar to multiple prior exams. Septal thickening on prior CT suggests an element of pulmonary edema. There is background emphysema on CT. 3. No new or acute airspace disease  07-03-20: ct of head:  1. No acute intracranial findings. 2. Mildly progressive atrophy and chronic small vessel ischemic changes. 3. Left sphenoid sinus disease.  07-06-20: EEG: This study showed evidence of epileptogenic city arising from left temporal region as well as mild diffuse encephalopathy, nonspecific etiology.  No seizures were seen throughout the recording.   07-08-20: chest x-ray:  Left basilar airspace disease could be due to atelectasis or pneumonia, unchanged. Cardiomegaly without edema. Aortic Atherosclerosis  and Emphysema   NO NEW EXAMS  LABS REVIEWED PREVIOUS  06-29-20: wbc 7.8;  hgb 9.9 hct 32.0; mcv 94.4 plt 210; glucose 116; bun 59; creat 1.70; k+ 5.2; na++ 134; ca 9.1 liver normal albumin 3.8  07-01-20; wbc 5.8; hgb 8.8; hct 28.9 mcv 93.8 plt 203; glucose 81; bun 55; creat 1.45; k+ 4.0; na++ 138; ca 9.3 liver normal albumin 3.2 mag 2.0; phos 3.3 07-04-20: wbc 12.3; hgb 9.3; hct 29.1; mcv 90.9 plt 182 07-07-20: wbc 6.1; hgb 8.5; hct 28.0; mcv 95.2 plt 181; glucose 90; bun 63; creat 1.38; k+ 4.0; na++ 138; ca 8.6 mag 2.1; phos 3.8  07-08-20: wbc 7.2; hgb 9.2; hct 30.7; mcv 95.6 plt 190; glucose 121; bun 65; creat 1.83; k+ 4.2; na++ 137; ca 8.9 BNP 913.0; INR 3.2 07-09-20: INR 3.6 07-13-20: INR 1.8  07-16-20; INR 1.3 on 10 mg daily  07-19-20: INR 1.4  07-22-20: INR 1.8  07-26-20: INR 2.3  07-27-20: INR 2.3  TODAY  08-02-20: INR 3.3 on 12 mg coumadin      Review of Systems  Constitutional: Negative for malaise/fatigue.  Respiratory: Negative for cough and shortness of breath.   Cardiovascular: Negative for chest pain, palpitations and leg swelling.  Gastrointestinal: Negative for abdominal pain, constipation and heartburn.  Musculoskeletal: Negative for back pain, joint pain and myalgias.  Skin: Negative.   Neurological: Negative for dizziness.  Psychiatric/Behavioral: The patient is not nervous/anxious.       Physical Exam Constitutional:      General: She is not in acute distress.    Appearance: She is well-developed. She is not diaphoretic.  Neck:     Thyroid: No thyromegaly.  Cardiovascular:     Rate and Rhythm: Normal rate and regular rhythm.     Pulses: Normal pulses.     Heart sounds: Normal heart sounds.     Comments: History of aortic and mitral valve replacements Pacemaker ICD placement  Pulmonary:     Effort: Pulmonary effort is normal. No respiratory distress.     Breath sounds: Normal breath sounds.     Comments: 02 Abdominal:     General: Bowel sounds are normal. There is no distension.     Palpations: Abdomen is soft.     Tenderness:  There is no abdominal tenderness.  Musculoskeletal:        General: Normal range of motion.     Cervical back: Neck supple.     Right lower leg: No edema.     Left lower leg: No edema.  Lymphadenopathy:     Cervical: No cervical adenopathy.  Skin:    General: Skin is warm and dry.  Neurological:     Mental Status: She is alert and oriented to person, place, and time.  Psychiatric:        Mood and Affect: Mood normal.     ASSESSMENT/ PLAN:  TODAY  1. Persistent atrial fibrillation 2. Status post MVR/AVR 3. Chronic anticoagulation    For her INR 3.3 Will stop lovenox Will give an extra 5 mg one time Will check INR on 08-09-20   MD is aware of resident's narcotic use and is in agreement with current plan of care. We will attempt to wean resident as appropriate.  Ok Edwards NP [facility]  Contact [phone] Monday through Friday 8am- 5pm  After hours call [phone]

## 2020-08-03 ENCOUNTER — Encounter: Payer: Self-pay | Admitting: Adult Health

## 2020-08-03 ENCOUNTER — Non-Acute Institutional Stay (SKILLED_NURSING_FACILITY): Payer: Medicare Other | Admitting: Adult Health

## 2020-08-03 DIAGNOSIS — N390 Urinary tract infection, site not specified: Secondary | ICD-10-CM | POA: Diagnosis not present

## 2020-08-03 NOTE — Progress Notes (Signed)
Location:    [place] Room Number: 151-P Place of Service:  SNF (31)   CODE STATUS: DNR  Allergies  Allergen Reactions  . Penicillins Other (See Comments)    Bruise    . Aspirin Other (See Comments)    On coumadin  . Fentanyl Dermatitis    Rash with patch   . Niacin Itching    Chief Complaint  Patient presents with  . Acute Visit    Personal concerns     HPI:  Staff reports today that she has a cyst on her right buttock. The area is tender to touch. There is no redness present no warmth present. She has developed dysuria last night. She does have some pubic area pain present with nausea present as well. There are no reports of fevers present.   Past Medical History:  Diagnosis Date  . Acute on chronic diastolic CHF (congestive heart failure) ([place]) 05/19/2012  . Acute on chronic respiratory failure with hypoxia ([place]) 01/31/2020  . Adenomatous polyp of colon 12/16/2002   Dr. [name]  . Allergy   . Calculus of gallbladder with chronic cholecystitis without obstruction 02/09/2009   Qualifier: Diagnosis of  By: [name], [name]    . Cataract   . CHB (complete heart block) ([place]) 10/07/2014      . CHF (congestive heart failure) ([place])    a.  EF previously reduced to 25-30% b. EF improved to 60-65% by echo in 10/2017.   Marland Kitchen Chronic kidney disease, stage I 2006  . Constipation       . COPD (chronic obstructive pulmonary disease) ([place])       . DJD (degenerative joint disease) of lumbar spine   . DM (diabetes mellitus) ([place]) 2006  . Esophageal reflux   . Glaucoma   . Gout       . H/O adenomatous polyp of colon 05/24/2012   2004 in [place] colonoscopy 2008 normal    . H/O heart valve replacement with mechanical valve    a. s/p mechanical AVR in 2001 and mechanical MVR in 2004.  Marland Kitchen H/O wisdom tooth extraction   . HIP PAIN, RIGHT 04/23/2009   Qualifier: Diagnosis of  By: [name], [name]    .  Hyperlipemia   . IDA (iron deficiency anemia)    Parenteral iron/[name]  . Insomnia       . Nausea without vomiting 06/07/2010   Qualifier: Diagnosis of  By: [name], [name]    . Non-ischemic cardiomyopathy ([place])    a. s/p MDT CRTD  . Obesity, unspecified   . Oxygen deficiency 2014   nocturnal;  . Papilloma of breast 03/06/2012   This was diagnosed as a left breast papilloma by ultrasound characteristics and symptoms in 2010. Because of possible medical risk factors no surgical intervention has been done and it did doing annual followups. The area has been stable by physical examination and mammograms and ultrasound since 2010.   Marland Kitchen Spinal stenosis of lumbar region 02/19/2013  . Spondylolisthesis   . Spondylosis   . Thyroid cancer ([place])    remote thyroidectomy, no recurrence, pt denies in 2016 thyroid cancer  . Type 2 diabetes mellitus with hyperglycemia ([place]) 07/15/2010  . Unspecified hypothyroidism       . 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 Vertigo         Past Surgical History:  Procedure Laterality Date  . AORTIC AND  MITRAL VALVE REPLACEMENT  2001  . BI-VENTRICULAR IMPLANTABLE CARDIOVERTER DEFIBRILLATOR UPGRADE N/A 01/18/2015   Procedure: BI-VENTRICULAR IMPLANTABLE CARDIOVERTER DEFIBRILLATOR UPGRADE;  Surgeon: Evans Lance, MD;  Location[facility] CATH LAB;  Service: Cardiovascular;  Laterality: N/A;  . BIV ICD GENERATOR CHANGEOUT N/A 01/30/2019   Procedure: BIV ICD GENERATOR CHANGEOUT;  Surgeon: Evans Lance, MD;  Location: [facility] CV LAB;  Service: Cardiovascular;  Laterality: N/A;  . CARDIAC CATHETERIZATION    . CARDIAC VALVE REPLACEMENT    . CARDIOVERSION N/A 11/13/2016   Procedure: CARDIOVERSION;  Surgeon: Sanda Klein, MD;  Location: [facility] ENDOSCOPY;  Service: Cardiovascular;  Laterality: N/A;  . COLONOSCOPY  06/12/2007   Rourk- Normal rectum/Normal colon  . COLONOSCOPY  12/16/02   small hemorrhoids  . COLONOSCOPY  06/20/2012    Procedure: COLONOSCOPY;  Surgeon: Daneil Dolin, MD;  Location: [facility] ENDO SUITE;  Service: Endoscopy;  Laterality: N/A;  10:45  . defibrillator implanted 2006    . DOPPLER ECHOCARDIOGRAPHY  2012  . DOPPLER ECHOCARDIOGRAPHY  05,06,07,08,09,2011  . ESOPHAGOGASTRODUODENOSCOPY  06/12/2007   Rourk- normal esophagus, small hiatal hernia, otherwise normal stomach, D1, D2  . EYE SURGERY Right 2005  . EYE SURGERY Left 2006  . ICD LEAD REMOVAL Left 02/08/2015   Procedure: ICD LEAD REMOVAL;  Surgeon: Evans Lance, MD;  Location: [facility] OR;  Service: Cardiovascular;  Laterality: Left;  "Will plan extraction and insertion of a BiV PM"  **Dr. Roxan Hockey backing up case**  . IMPLANTABLE CARDIOVERTER DEFIBRILLATOR (ICD) GENERATOR CHANGE Left 02/08/2015   Procedure: ICD GENERATOR CHANGE;  Surgeon: Evans Lance, MD;  Location: [place];  Service: Cardiovascular;  Laterality: Left;  . INSERT / REPLACE / REMOVE PACEMAKER    . PACEMAKER INSERTION  May 2016  . pacemaker placed  2004  . right breast cyst removed     benign   . right cataract removed     2005  . TEE WITHOUT CARDIOVERSION N/A 11/13/2016   Procedure: TRANSESOPHAGEAL ECHOCARDIOGRAM (TEE);  Surgeon: Sanda Klein, MD;  Location: [facility] ENDOSCOPY;  Service: Cardiovascular;  Laterality: N/A;  . THYROIDECTOMY      Social History   Socioeconomic History  . Marital status: Widowed    Spouse name: Not on file  . Number of children: 0  . Years of education: Not on file  . Highest education level: Not on file  Occupational History  . Occupation: retired    Fish farm manager: RETIRED  Tobacco Use  . Smoking status: Former Smoker    Packs/day: 0.75    Years: 40.00    Pack years: 30.00    Types: Cigarettes    Quit date: 03/08/1987    Years since quitting: 33.4  . Smokeless tobacco: Never Used  Vaping Use  . Vaping Use: Never used  Substance and Sexual Activity  . Alcohol use: No    Alcohol/week: 0.0 standard drinks  . Drug use: No  . Sexual activity: Not  Currently  Other Topics Concern  . Not on file  Social History Narrative   From [place] (2004)   Social Determinants of Health   Financial Resource Strain: Low Risk   . Difficulty of Paying Living Expenses: Not hard at all  Food Insecurity: No Food Insecurity  . Worried About Charity fundraiser in the Last Year: Never true  . Ran Out of Food in the Last Year: Never true  Transportation Needs: No Transportation Needs  . Lack of Transportation (Medical): No  . Lack of Transportation (Non-Medical): No  Physical Activity: Inactive  . Days of Exercise per Week: 0 days  . Minutes of Exercise per Session: 0 min  Stress: No Stress Concern Present  . Feeling of Stress : Only a little  Social Connections: Moderately Isolated  . Frequency of Communication with Friends and Family: More than three times a week  . Frequency of Social Gatherings with Friends and Family: Twice a week  . Attends Religious Services: More than 4 times per year  . Active Member of Clubs or Organizations: No  . Attends Archivist Meetings: Never  . Marital Status: Widowed  Intimate Partner Violence:   . Fear of Current or Ex-Partner: Not on file  . Emotionally Abused: Not on file  . Physically Abused: Not on file  . Sexually Abused: Not on file   Family History  Problem Relation Age of Onset  . Pancreatic cancer Mother   . Heart disease Father   . Heart disease Sister   . Heart attack Sister   . Heart disease Brother   . Diabetes Brother   . Heart disease Brother   . Diabetes Brother   . Hypertension Brother   . Lung cancer Brother       VITAL SIGNS BP 134/67   Pulse (!) 59   Temp 98.2 F (36.8 C)   Resp 20   Ht 5\' 7"  (1.702 m)   Wt 193 lb (87.5 kg)   SpO2 92%   BMI 30.23 kg/m   Facility-Administered Encounter Medications as of 08/03/2020  Medication  . epoetin alfa (EPOGEN,PROCRIT) injection 40,000 Units   Outpatient Encounter Medications as of 08/03/2020  Medication Sig  .  acetaminophen (TYLENOL) 500 MG tablet Take 1 tablet (500 mg total) by mouth every 6 (six) hours as needed (pain).  Marland Kitchen albuterol (PROVENTIL) (2.5 MG/3ML) 0.083% nebulizer solution Take 3 mLs (2.5 mg total) by nebulization in the morning and at bedtime. *May use additional one time if needed for shortness of breath  . albuterol (VENTOLIN HFA) 108 (90 Base) MCG/ACT inhaler Inhale 1 puff into the lungs every 6 (six) hours as needed for wheezing or shortness of breath.  . allopurinol (ZYLOPRIM) 100 MG tablet TAKE 1 TABLET(100 MG) BY MOUTH DAILY  . BREO ELLIPTA 100-25 MCG/INH AEPB 1 TAKE 1 INHALATION BY MOUTH ONCE DAILY  . cholecalciferol (VITAMIN D3) 25 MCG (1000 UNIT) tablet Take 1 tablet (1,000 Units total) by mouth in the morning.  . fluticasone (FLONASE) 50 MCG/ACT nasal spray Place 2 sprays into both nostrils daily.  . folic acid (FOLVITE) 1 MG tablet TAKE 1 TABLET(1 MG) BY MOUTH DAILY  . furosemide (LASIX) 40 MG tablet Take 40 mg by mouth daily. Start 07/13/2020  . gabapentin (NEURONTIN) 300 MG capsule TAKE 1 CAPSULE(300 MG) BY MOUTH TWICE DAILY  . levETIRAcetam (KEPPRA) 100 MG/ML solution Take 500 mg by mouth 2 (two) times daily. 9 am and 9 pm  . Liniments (SALONPAS PAIN RELIEF PATCH EX) Apply 1 patch topically daily as needed (pain).   . metoprolol tartrate (LOPRESSOR) 50 MG tablet Take 50 mg by mouth 2 (two) times daily. 9 am & 9 pm  . nitroGLYCERIN (NITROSTAT) 0.4 MG SL tablet Place 1 tablet (0.4 mg total) under the tongue every 5 (five) minutes as needed for chest pain.  . NON FORMULARY Diet Change: Soft diet with dys 2 (ground) meats, thin liquids (NAS, cons CHO, heart healthy)  . ondansetron (ZOFRAN ODT) 4 MG disintegrating tablet Take 1 tablet (4 mg total) by mouth  every 8 (eight) hours as needed for nausea or vomiting.  . OXYGEN Inhale 3 L into the lungs continuous.  [name] [name] (SYSTANE) 0.4-0.3 % SOLN Apply 1 drop to eye at bedtime. Left eye  . polyethylene glycol  (MIRALAX / GLYCOLAX) 17 g packet Take 17 g by mouth daily as needed.  . potassium chloride SA (KLOR-CON) 20 MEQ tablet Take 20 mEq by mouth daily.  . pravastatin (PRAVACHOL) 40 MG tablet Take 1 tablet (40 mg total) by mouth daily.  Marland Kitchen senna (SENOKOT) 8.6 MG TABS tablet Take 2 tablets (17.2 mg total) by mouth daily as needed for mild constipation.  Marland Kitchen SYNTHROID 75 MCG tablet Take 1 tablet (75 mcg total) by mouth daily before breakfast.  . warfarin (COUMADIN) 10 MG tablet Take 10 mg by mouth every evening. Take along with 2 mg for a total dose of 12 mg Special Instructions: INR goal 2.5-3.5 due to valve replacements  . warfarin (COUMADIN) 2 MG tablet Take 2 mg by mouth every evening. Take along with 10 mg for a total dose of 12 mg  . [DISCONTINUED] Polyethyl [name] (SYSTANE) 0.4-0.3 % GEL ophthalmic gel Place 1 application into the left eye at bedtime.     SIGNIFICANT DIAGNOSTIC EXAMS  PREVIOUS  06-29-20 chest x-ray:  1. Stable cardiomegaly. 2. Unchanged interstitial coarsening, this is similar to multiple prior exams. Septal thickening on prior CT suggests an element of pulmonary edema. There is background emphysema on CT. 3. No new or acute airspace disease  07-03-20: ct of head:  1. No acute intracranial findings. 2. Mildly progressive atrophy and chronic small vessel ischemic changes. 3. Left sphenoid sinus disease.  07-06-20: EEG: This study showed evidence of epileptogenic city arising from left temporal region as well as mild diffuse encephalopathy, nonspecific etiology.  No seizures were seen throughout the recording.   07-08-20: chest x-ray:  Left basilar airspace disease could be due to atelectasis or pneumonia, unchanged. Cardiomegaly without edema. Aortic Atherosclerosis  and Emphysema   NO NEW EXAMS  LABS REVIEWED PREVIOUS  06-29-20: wbc 7.8; hgb 9.9 hct 32.0; mcv 94.4 plt 210; glucose 116; bun 59; creat 1.70; k+ 5.2; na++ 134; ca 9.1 liver normal albumin 3.8    07-01-20; wbc 5.8; hgb 8.8; hct 28.9 mcv 93.8 plt 203; glucose 81; bun 55; creat 1.45; k+ 4.0; na++ 138; ca 9.3 liver normal albumin 3.2 mag 2.0; phos 3.3 07-04-20: wbc 12.3; hgb 9.3; hct 29.1; mcv 90.9 plt 182 07-07-20: wbc 6.1; hgb 8.5; hct 28.0; mcv 95.2 plt 181; glucose 90; bun 63; creat 1.38; k+ 4.0; na++ 138; ca 8.6 mag 2.1; phos 3.8  07-08-20: wbc 7.2; hgb 9.2; hct 30.7; mcv 95.6 plt 190; glucose 121; bun 65; creat 1.83; k+ 4.2; na++ 137; ca 8.9 BNP 913.0; INR 3.2 07-09-20: INR 3.6 07-13-20: INR 1.8  07-16-20; INR 1.3 on 10 mg daily  07-19-20: INR 1.4  07-22-20: INR 1.8  07-26-20: INR 2.3  07-27-20: INR 2.3 08-02-20: INR 3.3 on 12 mg coumadin    NO NEW LABS.     Review of Systems  Constitutional: Positive for malaise/fatigue. Negative for fever.  Respiratory: Negative for cough and shortness of breath.   Cardiovascular: Negative for chest pain, palpitations and leg swelling.  Gastrointestinal: Positive for abdominal pain and nausea. Negative for constipation, heartburn and vomiting.       Has pubic area pain present   Genitourinary: Positive for dysuria.  Musculoskeletal: Negative for back pain, joint pain and myalgias.  Skin:       Has cyst on right buttock   Neurological: Negative for dizziness.  Psychiatric/Behavioral: The patient is not nervous/anxious.     Physical Exam Constitutional:      General: She is not in acute distress.    Appearance: She is well-developed. She is not diaphoretic.  Neck:     Thyroid: No thyromegaly.  Cardiovascular:     Rate and Rhythm: Normal rate and regular rhythm.     Pulses: Normal pulses.     Heart sounds: Normal heart sounds.     Comments: History of aortic and mitral valve replacements Pacemaker ICD placement  Pulmonary:     Effort: Pulmonary effort is normal. No respiratory distress.     Breath sounds: Normal breath sounds.     Comments: 02 Abdominal:     General: Bowel sounds are normal. There is no distension.      Palpations: Abdomen is soft.     Tenderness: There is no abdominal tenderness.  Musculoskeletal:        General: Normal range of motion.     Cervical back: Neck supple.  Lymphadenopathy:     Cervical: No cervical adenopathy.  Skin:    General: Skin is warm and dry.     Comments: Right buttock has a small cyst present. Is tender to palpation; no redness or warmth present will monitor   Neurological:     Mental Status: She is alert and oriented to person, place, and time.           ASSESSMENT/ PLAN:  TODAY  1. Acute UTI  Will get urine culture Will monitor cyst; more than likely will self resolve.    MD is aware of resident's narcotic use and is in agreement with current plan of care. We will attempt to wean resident as appropriate.  Ok Edwards NP [facility]  Contact [phone] Monday through Friday 8am- 5pm  After hours call [phone]

## 2020-08-04 ENCOUNTER — Other Ambulatory Visit (HOSPITAL_COMMUNITY)
Admission: RE | Admit: 2020-08-04 | Discharge: 2020-08-04 | Disposition: A | Discharge status: Home / Self Care | Payer: Medicare Other | Source: Skilled Nursing Facility | Attending: Adult Health | Admitting: Adult Health

## 2020-08-04 ENCOUNTER — Encounter: Payer: Self-pay | Admitting: Adult Health

## 2020-08-04 ENCOUNTER — Non-Acute Institutional Stay (SKILLED_NURSING_FACILITY): Payer: Medicare Other | Admitting: Adult Health

## 2020-08-04 DIAGNOSIS — I5022 Chronic systolic (congestive) heart failure: Secondary | ICD-10-CM

## 2020-08-04 DIAGNOSIS — R300 Dysuria: Secondary | ICD-10-CM | POA: Insufficient documentation

## 2020-08-04 NOTE — Progress Notes (Signed)
Location:    [facility] Room Number: 151/P Place of Service:  SNF (31)   CODE STATUS: DNR  Allergies  Allergen Reactions  . Penicillins Other (See Comments)    Bruise    . Aspirin Other (See Comments)    On coumadin  . Fentanyl Dermatitis    Rash with patch   . Niacin Itching    Chief Complaint  Patient presents with  . Acute Visit    Weight Gain    HPI:  She is gaining weight. Her weight on 08-02-20 was 193.9; weight today is 199.6. she denies any worsening shortness of breath; no chest pain; no orthopnea. She denies any worsening edema.   Past Medical History:  Diagnosis Date  . Acute on chronic diastolic CHF (congestive heart failure) ([place]) 05/19/2012  . Acute on chronic respiratory failure with hypoxia ([place]) 01/31/2020  . Adenomatous polyp of colon 12/16/2002   Dr. [name]/[facility]  . Allergy   . Calculus of gallbladder with chronic cholecystitis without obstruction 02/09/2009   Qualifier: Diagnosis of  By: [name], [name]    . Cataract   . CHB (complete heart block) ([place]) 10/07/2014      . CHF (congestive heart failure) ([place])    a.  EF previously reduced to 25-30% b. EF improved to 60-65% by echo in 10/2017.   Marland Kitchen Chronic kidney disease, stage I 2006  . Constipation       . COPD (chronic obstructive pulmonary disease) ([place])       . DJD (degenerative joint disease) of lumbar spine   . DM (diabetes mellitus) ([place]) 2006  . Esophageal reflux   . Glaucoma   . Gout       . H/O adenomatous polyp of colon 05/24/2012   2004 in [place] colonoscopy 2008 normal    . H/O heart valve replacement with mechanical valve    a. s/p mechanical AVR in 2001 and mechanical MVR in 2004.  Marland Kitchen H/O wisdom tooth extraction   . HIP PAIN, RIGHT 04/23/2009   Qualifier: Diagnosis of  By: [name], [name]    . Hyperlipemia   . IDA (iron deficiency anemia)    Parenteral iron/[name]  . Insomnia       . Nausea  without vomiting 06/07/2010   Qualifier: Diagnosis of  By: [name], [name]    . Non-ischemic cardiomyopathy ([place])    a. s/p MDT CRTD  . Obesity, unspecified   . Oxygen deficiency 2014   nocturnal;  . Papilloma of breast 03/06/2012   This was diagnosed as a left breast papilloma by ultrasound characteristics and symptoms in 2010. Because of possible medical risk factors no surgical intervention has been done and it did doing annual followups. The area has been stable by physical examination and mammograms and ultrasound since 2010.   Marland Kitchen Spinal stenosis of lumbar region 02/19/2013  . Spondylolisthesis   . Spondylosis   . Thyroid cancer ([place])    remote thyroidectomy, no recurrence, pt denies in 2016 thyroid cancer  . Type 2 diabetes mellitus with hyperglycemia ([place]) 07/15/2010  . Unspecified hypothyroidism       . 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 Vertigo         Past Surgical History:  Procedure Laterality Date  . AORTIC AND MITRAL VALVE REPLACEMENT  2001  . BI-VENTRICULAR IMPLANTABLE CARDIOVERTER DEFIBRILLATOR UPGRADE N/A 01/18/2015   Procedure: BI-VENTRICULAR IMPLANTABLE CARDIOVERTER DEFIBRILLATOR UPGRADE;  Surgeon: Evans Lance, MD;  Location: [facility] CATH LAB;  Service: Cardiovascular;  Laterality: N/A;  . BIV ICD GENERATOR CHANGEOUT N/A 01/30/2019   Procedure: BIV ICD GENERATOR CHANGEOUT;  Surgeon: Evans Lance, MD;  Location: [facility] CV LAB;  Service: Cardiovascular;  Laterality: N/A;  . CARDIAC CATHETERIZATION    . CARDIAC VALVE REPLACEMENT    . CARDIOVERSION N/A 11/13/2016   Procedure: CARDIOVERSION;  Surgeon: Sanda Klein, MD;  Location: [facility] ENDOSCOPY;  Service: Cardiovascular;  Laterality: N/A;  . COLONOSCOPY  06/12/2007   [place]ourk- Normal rectum/Normal colon  . COLONOSCOPY  12/16/02   small hemorrhoids  . COLONOSCOPY  06/20/2012   Procedure: COLONOSCOPY;  Surgeon: Daneil Dolin, MD;  Location: [facility] ENDO SUITE;  Service: Endoscopy;  Laterality:  N/A;  10:45  . defibrillator implanted 2006    . DOPPLER ECHOCARDIOGRAPHY  2012  . DOPPLER ECHOCARDIOGRAPHY  05,06,07,08,09,2011  . ESOPHAGOGASTRODUODENOSCOPY  06/12/2007   Rourk- normal esophagus, small hiatal hernia, otherwise normal stomach, D1, D2  . EYE SURGERY Right 2005  . EYE SURGERY Left 2006  . ICD LEAD REMOVAL Left 02/08/2015   Procedure: ICD LEAD REMOVAL;  Surgeon: Evans Lance, MD;  Location: [facility] OR;  Service: Cardiovascular;  Laterality: Left;  "Will plan extraction and insertion of a BiV PM"  **Dr. Roxan Hockey backing up case**  . IMPLANTABLE CARDIOVERTER DEFIBRILLATOR (ICD) GENERATOR CHANGE Left 02/08/2015   Procedure: ICD GENERATOR CHANGE;  Surgeon: Evans Lance, MD;  Location: [facility];  Service: Cardiovascular;  Laterality: Left;  . INSERT / REPLACE / REMOVE PACEMAKER    . PACEMAKER INSERTION  May 2016  . pacemaker placed  2004  . right breast cyst removed     benign   . right cataract removed     2005  . TEE WITHOUT CARDIOVERSION N/A 11/13/2016   Procedure: TRANSESOPHAGEAL ECHOCARDIOGRAM (TEE);  Surgeon: Sanda Klein, MD;  Location: [facility] ENDOSCOPY;  Service: Cardiovascular;  Laterality: N/A;  . THYROIDECTOMY      Social History   Socioeconomic History  . Marital status: Widowed    Spouse name: Not on file  . Number of children: 0  . Years of education: Not on file  . Highest education level: Not on file  Occupational History  . Occupation: retired    Fish farm manager: RETIRED  Tobacco Use  . Smoking status: Former Smoker    Packs/day: 0.75    Years: 40.00    Pack years: 30.00    Types: Cigarettes    Quit date: 03/08/1987    Years since quitting: 33.4  . Smokeless tobacco: Never Used  Vaping Use  . Vaping Use: Never used  Substance and Sexual Activity  . Alcohol use: No    Alcohol/week: 0.0 standard drinks  . Drug use: No  . Sexual activity: Not Currently  Other Topics Concern  . Not on file  Social History Narrative   From [place] (2004)   Social  Determinants of Health   Financial Resource Strain: Low Risk   . Difficulty of Paying Living Expenses: Not hard at all  Food Insecurity: No Food Insecurity  . Worried About Charity fundraiser in the Last Year: Never true  . Ran Out of Food in the Last Year: Never true  Transportation Needs: No Transportation Needs  . Lack of Transportation (Medical): No  . Lack of Transportation (Non-Medical): No  Physical Activity: Inactive  . Days of Exercise per Week: 0 days  . Minutes of Exercise per Session: 0 min  Stress: No Stress Concern Present  . Feeling of Stress : Only a little  Social Connections: Moderately Isolated  . Frequency of Communication with Friends and Family: More than three times a week  . Frequency of Social Gatherings with Friends and Family: Twice a week  . Attends Religious Services: More than 4 times per year  . Active Member of Clubs or Organizations: No  . Attends Archivist Meetings: Never  . Marital Status: Widowed  Intimate Partner Violence:   . Fear of Current or Ex-Partner: Not on file  . Emotionally Abused: Not on file  . Physically Abused: Not on file  . Sexually Abused: Not on file   Family History  Problem Relation Age of Onset  . Pancreatic cancer Mother   . Heart disease Father   . Heart disease Sister   . Heart attack Sister   . Heart disease Brother   . Diabetes Brother   . Heart disease Brother   . Diabetes Brother   . Hypertension Brother   . Lung cancer Brother       VITAL SIGNS BP 134/67   Pulse (!) 59   Temp 97.8 F (36.6 C)   Resp 20   Ht 5\' 7"  (1.702 m)   Wt 199 lb 9.6 oz (90.5 kg)   SpO2 94%   BMI 31.26 kg/m   Facility-Administered Encounter Medications as of 08/04/2020  Medication  . epoetin alfa (EPOGEN,PROCRIT) injection 40,000 Units   Outpatient Encounter Medications as of 08/04/2020  Medication Sig  . acetaminophen (TYLENOL) 500 MG tablet Take 1 tablet (500 mg total) by mouth every 6 (six) hours as  needed (pain).  Marland Kitchen albuterol (PROVENTIL) (2.5 MG/3ML) 0.083% nebulizer solution Take 3 mLs (2.5 mg total) by nebulization in the morning and at bedtime. *May use additional one time if needed for shortness of breath  . albuterol (VENTOLIN HFA) 108 (90 Base) MCG/ACT inhaler Inhale 1 puff into the lungs every 6 (six) hours as needed for wheezing or shortness of breath.  . allopurinol (ZYLOPRIM) 100 MG tablet TAKE 1 TABLET(100 MG) BY MOUTH DAILY  . BREO ELLIPTA 100-25 MCG/INH AEPB 1 TAKE 1 INHALATION BY MOUTH ONCE DAILY  . cholecalciferol (VITAMIN D3) 25 MCG (1000 UNIT) tablet Take 1 tablet (1,000 Units total) by mouth in the morning.  . fluticasone (FLONASE) 50 MCG/ACT nasal spray Place 2 sprays into both nostrils daily.  . folic acid (FOLVITE) 1 MG tablet TAKE 1 TABLET(1 MG) BY MOUTH DAILY  . furosemide (LASIX) 40 MG tablet Take 40 mg by mouth daily. Start 07/13/2020  . gabapentin (NEURONTIN) 300 MG capsule TAKE 1 CAPSULE(300 MG) BY MOUTH TWICE DAILY  . levETIRAcetam (KEPPRA) 100 MG/ML solution Take 500 mg by mouth 2 (two) times daily. 9 am and 9 pm  . Liniments (SALONPAS PAIN RELIEF PATCH EX) Apply 1 patch topically daily as needed (pain).   . metoprolol tartrate (LOPRESSOR) 50 MG tablet Take 50 mg by mouth 2 (two) times daily. 9 am & 9 pm  . nitroGLYCERIN (NITROSTAT) 0.4 MG SL tablet Place 1 tablet (0.4 mg total) under the tongue every 5 (five) minutes as needed for chest pain.  . NON FORMULARY Diet Change: Soft diet with dys 2 (ground) meats, thin liquids (NAS, cons CHO, heart healthy)  . ondansetron (ZOFRAN ODT) 4 MG disintegrating tablet Take 1 tablet (4 mg total) by mouth every 8 (eight) hours as needed for nausea or vomiting.  . OXYGEN Inhale 3 L into the lungs continuous.  Marland Kitchen  Polyethyl [name] (SYSTANE) 0.4-0.3 % SOLN Apply 1 drop to eye at bedtime. Left eye  . polyethylene glycol (MIRALAX / GLYCOLAX) 17 g packet Take 17 g by mouth daily as needed.  . potassium chloride SA  (KLOR-CON) 20 MEQ tablet Take 20 mEq by mouth daily. And give 20 mg by mouth every evening at 4:00 pm  . pravastatin (PRAVACHOL) 40 MG tablet Take 1 tablet (40 mg total) by mouth daily.  Marland Kitchen senna (SENOKOT) 8.6 MG TABS tablet Take 2 tablets (17.2 mg total) by mouth daily as needed for mild constipation.  Marland Kitchen SYNTHROID 75 MCG tablet Take 1 tablet (75 mcg total) by mouth daily before breakfast.  . warfarin (COUMADIN) 10 MG tablet Take 10 mg by mouth every evening. Take along with 2 mg for a total dose of 12 mg Special Instructions: INR goal 2.5-3.5 due to valve replacements  . warfarin (COUMADIN) 2 MG tablet Take 2 mg by mouth every evening. Take along with 10 mg for a total dose of 12 mg     SIGNIFICANT DIAGNOSTIC EXAMS   PREVIOUS  06-29-20 chest x-ray:  1. Stable cardiomegaly. 2. Unchanged interstitial coarsening, this is similar to multiple prior exams. Septal thickening on prior CT suggests an element of pulmonary edema. There is background emphysema on CT. 3. No new or acute airspace disease  07-03-20: ct of head:  1. No acute intracranial findings. 2. Mildly progressive atrophy and chronic small vessel ischemic changes. 3. Left sphenoid sinus disease.  07-06-20: EEG: This study showed evidence of epileptogenic city arising from left temporal region as well as mild diffuse encephalopathy, nonspecific etiology.  No seizures were seen throughout the recording.   07-08-20: chest x-ray:  Left basilar airspace disease could be due to atelectasis or pneumonia, unchanged. Cardiomegaly without edema. Aortic Atherosclerosis  and Emphysema   NO NEW EXAMS  LABS REVIEWED PREVIOUS  06-29-20: wbc 7.8; hgb 9.9 hct 32.0; mcv 94.4 plt 210; glucose 116; bun 59; creat 1.70; k+ 5.2; na++ 134; ca 9.1 liver normal albumin 3.8  07-01-20; wbc 5.8; hgb 8.8; hct 28.9 mcv 93.8 plt 203; glucose 81; bun 55; creat 1.45; k+ 4.0; na++ 138; ca 9.3 liver normal albumin 3.2 mag 2.0; phos 3.3 07-04-20: wbc 12.3; hgb  9.3; hct 29.1; mcv 90.9 plt 182 07-07-20: wbc 6.1; hgb 8.5; hct 28.0; mcv 95.2 plt 181; glucose 90; bun 63; creat 1.38; k+ 4.0; na++ 138; ca 8.6 mag 2.1; phos 3.8  07-08-20: wbc 7.2; hgb 9.2; hct 30.7; mcv 95.6 plt 190; glucose 121; bun 65; creat 1.83; k+ 4.2; na++ 137; ca 8.9 BNP 913.0; INR 3.2 07-09-20: INR 3.6 07-13-20: INR 1.8  07-16-20; INR 1.3 on 10 mg daily  07-19-20: INR 1.4  07-22-20: INR 1.8  07-26-20: INR 2.3  07-27-20: INR 2.3 08-02-20: INR 3.3 on 12 mg coumadin    NO NEW LABS.    Review of Systems  Constitutional: Positive for malaise/fatigue.  Respiratory: Negative for cough and shortness of breath.   Cardiovascular: Negative for chest pain, palpitations and leg swelling.  Gastrointestinal: Negative for abdominal pain, constipation and heartburn.  Musculoskeletal: Negative for back pain, joint pain and myalgias.  Skin: Negative.   Neurological: Negative for dizziness.  Psychiatric/Behavioral: The patient is not nervous/anxious.     Physical Exam Constitutional:      General: She is not in acute distress.    Appearance: She is well-developed. She is not diaphoretic.  Neck:     Thyroid: No thyromegaly.  Cardiovascular:  Rate and Rhythm: Normal rate and regular rhythm.     Heart sounds: Normal heart sounds.     Comments: History of aortic and mitral valve replacements Pacemaker ICD placement  Pulmonary:     Effort: Pulmonary effort is normal. No respiratory distress.     Breath sounds: Normal breath sounds.     Comments: 02 Abdominal:     General: Bowel sounds are normal. There is no distension.     Palpations: Abdomen is soft.     Tenderness: There is no abdominal tenderness.  Musculoskeletal:        General: Normal range of motion.     Cervical back: Neck supple.     Right lower leg: Edema present.     Left lower leg: Edema present.     Comments: trace  Lymphadenopathy:     Cervical: No cervical adenopathy.  Skin:    General: Skin is warm and dry.    Neurological:     Mental Status: She is alert and oriented to person, place, and time.  Psychiatric:        Mood and Affect: Mood normal.        ASSESSMENT/ PLAN:  TODAY  1. Chronic systolic congestive heart failure  Is worse Will give an extra 40 mg lasix with k+ 20 meq through 08-06-20  Will get BMP on 08-09-20 with her INR  Will monitor her status.     MD is aware of resident's narcotic use and is in agreement with current plan of care. We will attempt to wean resident as appropriate.  Ok Edwards NP [facility]  Contact [phone] Monday through Friday 8am- 5pm  After hours call [phone]

## 2020-08-05 LAB — URINE CULTURE

## 2020-08-06 ENCOUNTER — Non-Acute Institutional Stay (SKILLED_NURSING_FACILITY): Payer: Medicare Other | Admitting: Adult Health

## 2020-08-06 ENCOUNTER — Encounter: Payer: Self-pay | Admitting: Adult Health

## 2020-08-06 ENCOUNTER — Other Ambulatory Visit (HOSPITAL_COMMUNITY)
Admission: RE | Admit: 2020-08-06 | Discharge: 2020-08-06 | Disposition: A | Discharge status: Home / Self Care | Payer: Medicare Other | Source: Skilled Nursing Facility | Attending: Adult Health | Admitting: Adult Health

## 2020-08-06 DIAGNOSIS — M1A30X0 Chronic gout due to renal impairment, unspecified site, without tophus (tophi): Secondary | ICD-10-CM | POA: Diagnosis not present

## 2020-08-06 DIAGNOSIS — K5909 Other constipation: Secondary | ICD-10-CM

## 2020-08-06 DIAGNOSIS — N390 Urinary tract infection, site not specified: Secondary | ICD-10-CM | POA: Insufficient documentation

## 2020-08-06 DIAGNOSIS — E11649 Type 2 diabetes mellitus with hypoglycemia without coma: Secondary | ICD-10-CM | POA: Diagnosis not present

## 2020-08-06 NOTE — Progress Notes (Signed)
Location: [place] Room Number: 151-P Place of Service:  SNF (31)   CODE STATUS: DNR  Allergies  Allergen Reactions  . Penicillins Other (See Comments)    Bruise    . Aspirin Other (See Comments)    On coumadin  . Fentanyl Dermatitis    Rash with patch   . Niacin Itching    Chief Complaint  Patient presents with  . 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 There are no reports of constipation; no joint pain; no reports of changes in appetite.    Past Medical History:  Diagnosis Date  . Acute on chronic diastolic CHF (congestive heart failure) ([place]) 05/19/2012  . Acute on chronic respiratory failure with hypoxia ([place]) 01/31/2020  . Adenomatous polyp of colon 12/16/2002   Dr. [name]/[facility]  . Allergy   . Calculus of gallbladder with chronic cholecystitis without obstruction 02/09/2009   Qualifier: Diagnosis of  By: [name], [name]    . Cataract   . CHB (complete heart block) ([place]) 10/07/2014      . CHF (congestive heart failure) ([place])    a.  EF previously reduced to 25-30% b. EF improved to 60-65% by echo in 10/2017.   Marland Kitchen Chronic kidney disease, stage I 2006  . Constipation       . COPD (chronic obstructive pulmonary disease) ([place])       . DJD (degenerative joint disease) of lumbar spine   . DM (diabetes mellitus) ([place]) 2006  . Esophageal reflux   . Glaucoma   . Gout       . H/O adenomatous polyp of colon  05/24/2012   2004 in [place] colonoscopy 2008 normal    . H/O heart valve replacement with mechanical valve    a. s/p mechanical AVR in 2001 and mechanical MVR in 2004.  Marland Kitchen H/O wisdom tooth extraction   . HIP PAIN, RIGHT 04/23/2009   Qualifier: Diagnosis of  By: [name], [name]    . Hyperlipemia   . IDA (iron deficiency anemia)    Parenteral iron/[name]  . Insomnia       . Nausea without vomiting 06/07/2010   Qualifier: Diagnosis of  By: [name], [name]    . Non-ischemic cardiomyopathy ([place])    a. s/p MDT CRTD  . Obesity, unspecified   . Oxygen deficiency 2014   nocturnal;  . Papilloma of breast 03/06/2012   This was diagnosed as a left breast papilloma by ultrasound characteristics and symptoms in 2010. Because of possible medical risk factors no surgical intervention has been done and it did doing annual followups. The area has been stable by physical examination and mammograms and ultrasound since 2010.   Marland Kitchen Spinal stenosis of lumbar region 02/19/2013  . Spondylolisthesis   . Spondylosis   . Thyroid cancer ([place])    remote thyroidectomy, no recurrence, pt denies in 2016 thyroid  cancer  . Type 2 diabetes mellitus with hyperglycemia ([place]) 07/15/2010  . Unspecified hypothyroidism       . 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 Vertigo         Past Surgical History:  Procedure Laterality Date  . AORTIC AND MITRAL VALVE REPLACEMENT  2001  . BI-VENTRICULAR IMPLANTABLE CARDIOVERTER DEFIBRILLATOR UPGRADE N/A 01/18/2015   Procedure: BI-VENTRICULAR IMPLANTABLE CARDIOVERTER DEFIBRILLATOR UPGRADE;  Surgeon: Evans Lance, MD;  Location[facility] C[facility] LAB;  Service: Cardiovascular;  Laterality: N/A;  . BIV ICD GENERATOR CHANGEOUT N/A 01/30/2019   Procedure: BIV ICD GENERATOR CHANGEOUT;  Surgeon: Evans Lance, MD;  Location: [place] CV LAB;  Service: Cardiovascular;  Laterality: N/A;  . CARDIAC CATHETERIZATION    . CARDIAC VALVE REPLACEMENT    .  CARDIOVERSION N/A 11/13/2016   Procedure: CARDIOVERSION;  Surgeon: Sanda Klein, MD;  Location: [facility] ENDOSCOPY;  Service: Cardiovascular;  Laterality: N/A;  . COLONOSCOPY  06/12/2007   [place]ourk- Normal rectum/Normal colon  . COLONOSCOPY  12/16/02   small hemorrhoids  . COLONOSCOPY  06/20/2012   Procedure: COLONOSCOPY;  Surgeon: Daneil Dolin, MD;  Location: [facility] ENDO SU[facility];  Service: Endoscopy;  Laterality: N/A;  10:45  . defibrillator implanted 2006    . DOPPLER ECHOCARDIOGRAPHY  2012  . DOPPLER ECHOCARDIOGRAPHY  05,06,07,08,09,2011  . ESOPHAGOGASTRODUODENOSCOPY  06/12/2007   Rourk- normal esophagus, small hiatal hernia, otherwise normal stomach, D1, D2  . EYE SURGERY Right 2005  . EYE SURGERY Left 2006  . ICD LEAD REMOVAL Left 02/08/2015   Procedure: ICD LEAD REMOVAL;  Surgeon: Evans Lance, MD;  [facility] OR;  Service: Cardiovascular;  Laterality: Left;  "Will plan extraction and insertion of a BiV PM"  **Dr. Roxan Hockey backing up case**  . IMPLANTABLE CARDIOVERTER DEFIBRILLATOR (ICD) GENERATOR CHANGE Left 02/08/2015   Procedure: ICD GENERATOR CHANGE;  Surgeon: Evans Lance, MD;  Location: [place];  Service: Cardiovascular;  Laterality: Left;  . INSERT / REPLACE / REMOVE PACEMAKER    . PACEMAKER INSERTION  May 2016  . pacemaker placed  2004  . right breast cyst removed     benign   . right cataract removed     2005  . TEE WITHOUT CARDIOVERSION N/A 11/13/2016   Procedure: TRANSESOPHAGEAL ECHOCARDIOGRAM (TEE);  Surgeon: Sanda Klein, MD;  Location: [facility] ENDOSCOPY;  Service: Cardiovascular;  Laterality: N/A;  . THYROIDECTOMY      Social History   Socioeconomic History  . Marital status: Widowed    Spouse name: Not on file  . Number of children: 0  . Years of education: Not on file  . Highest education level: Not on file  Occupational History  . Occupation: retired    Fish farm manager: RETIRED  Tobacco Use  . Smoking status: Former Smoker    Packs/day: 0.75    Years: 40.00    Pack  years: 30.00    Types: Cigarettes    Quit date: 03/08/1987    Years since quitting: 33.4  . Smokeless tobacco: Never Used  Vaping Use  . Vaping Use: Never used  Substance and Sexual Activity  . Alcohol use: No    Alcohol/week: 0.0 standard drinks  . Drug use: No  . Sexual activity: Not Currently  Other Topics Concern  . Not on file  Social History Narrative   From [place] (2004)   Social Determinants of Health   Financial Resource Strain: Low Risk   . Difficulty of Paying Living Expenses: Not hard at  all  Food Insecurity: No Food Insecurity  . Worried About Charity fundraiser in the Last Year: Never true  . Ran Out of Food in the Last Year: Never true  Transportation Needs: No Transportation Needs  . Lack of Transportation (Medical): No  . Lack of Transportation (Non-Medical): No  Physical Activity: Inactive  . Days of Exercise per Week: 0 days  . Minutes of Exercise per Session: 0 min  Stress: No Stress Concern Present  . Feeling of Stress : Only a little  Social Connections: Moderately Isolated  . Frequency of Communication with Friends and Family: More than three times a week  . Frequency of Social Gatherings with Friends and Family: Twice a week  . Attends Religious Services: More than 4 times per year  . Active Member of Clubs or Organizations: No  . Attends Archivist Meetings: Never  . Marital Status: Widowed  Intimate Partner Violence:   . Fear of Current or Ex-Partner: Not on file  . Emotionally Abused: Not on file  . Physically Abused: Not on file  . Sexually Abused: Not on file   Family History  Problem Relation Age of Onset  . Pancreatic cancer Mother   . Heart disease Father   . Heart disease Sister   . Heart attack Sister   . Heart disease Brother   . Diabetes Brother   . Heart disease Brother   . Diabetes Brother   . Hypertension Brother   . Lung cancer Brother       VITAL SIGNS BP 134/67   Pulse (!) 59   Temp 98 F (36.7 C)    Ht 5\' 7"  (1.702 m)   Wt 200 lb (90.7 kg)   SpO2 94%   BMI 31.32 kg/m   Facility-Administered Encounter Medications as of 08/06/2020  Medication  . epoetin alfa (EPOGEN,PROCRIT) injection 40,000 Units   Outpatient Encounter Medications as of 08/06/2020  Medication Sig  . acetaminophen (TYLENOL) 500 MG tablet Take 1 tablet (500 mg total) by mouth every 6 (six) hours as needed (pain).  Marland Kitchen albuterol (PROVENTIL) (2.5 MG/3ML) 0.083% nebulizer solution Take 3 mLs (2.5 mg total) by nebulization in the morning and at bedtime. *May use additional one time if needed for shortness of breath  . albuterol (VENTOLIN HFA) 108 (90 Base) MCG/ACT inhaler Inhale 1 puff into the lungs every 6 (six) hours as needed for wheezing or shortness of breath.  . allopurinol (ZYLOPRIM) 100 MG tablet TAKE 1 TABLET(100 MG) BY MOUTH DAILY  . BREO ELLIPTA 100-25 MCG/INH AEPB 1 TAKE 1 INHALATION BY MOUTH ONCE DAILY  . cholecalciferol (VITAMIN D3) 25 MCG (1000 UNIT) tablet Take 1 tablet (1,000 Units total) by mouth in the morning.  . fluticasone (FLONASE) 50 MCG/ACT nasal spray Place 2 sprays into both nostrils daily.  . folic acid (FOLVITE) 1 MG tablet TAKE 1 TABLET(1 MG) BY MOUTH DAILY  . furosemide (LASIX) 40 MG tablet Take 40 mg by mouth daily. Start 07/13/2020  . gabapentin (NEURONTIN) 300 MG capsule TAKE 1 CAPSULE(300 MG) BY MOUTH TWICE DAILY  . levETIRAcetam (KEPPRA) 500 MG tablet Take 500 mg by mouth 2 (two) times daily. 9 am and 9 pm  . Liniments (SALONPAS PAIN RELIEF PATCH EX) Apply 1 patch topically daily as needed (pain).   . metoprolol tartrate (LOPRESSOR) 50 MG tablet Take 50 mg by mouth 2 (two) times daily. 9 am & 9 pm  . nitroGLYCERIN (NITROSTAT) 0.4 MG SL tablet Place 1 tablet (0.4 mg  total) under the tongue every 5 (five) minutes as needed for chest pain.  . NON FORMULARY Diet Change: Soft diet with dys 2 (ground) meats, thin liquids (NAS, cons CHO, heart healthy)  . ondansetron (ZOFRAN ODT) 4 MG  disintegrating tablet Take 1 tablet (4 mg total) by mouth every 8 (eight) hours as needed for nausea or vomiting.  . OXYGEN Inhale 3 L into the lungs continuous.  [name] [name] (SYSTANE) 0.4-0.3 % SOLN Apply 1 drop to eye at bedtime. Left eye  . polyethylene glycol (MIRALAX / GLYCOLAX) 17 g packet Take 17 g by mouth daily as needed.  . potassium chloride SA (KLOR-CON) 20 MEQ tablet Take 20 mEq by mouth daily. 9 am  . pravastatin (PRAVACHOL) 40 MG tablet Take 1 tablet (40 mg total) by mouth daily.  Marland Kitchen senna (SENOKOT) 8.6 MG TABS tablet Take 2 tablets (17.2 mg total) by mouth daily as needed for mild constipation.  Marland Kitchen SYNTHROID 75 MCG tablet Take 1 tablet (75 mcg total) by mouth daily before breakfast.  . warfarin (COUMADIN) 10 MG tablet Take 10 mg by mouth every evening. Take along with 2 mg for a total dose of 12 mg Special Instructions: INR goal 2.5-3.5 due to valve replacements  . warfarin (COUMADIN) 2 MG tablet Take 2 mg by mouth every evening. Take along with 10 mg for a total dose of 12 mg  . [DISCONTINUED] levETIRAcetam (KEPPRA) 100 MG/ML solution Take 500 mg by mouth 2 (two) times daily. 9 am and 9 pm     SIGNIFICANT DIAGNOSTIC EXAMS  PREVIOUS  06-29-20 chest x-ray:  1. Stable cardiomegaly. 2. Unchanged interstitial coarsening, this is similar to multiple prior exams. Septal thickening on prior CT suggests an element of pulmonary edema. There is background emphysema on CT. 3. No new or acute airspace disease  07-03-20: ct of head:  1. No acute intracranial findings. 2. Mildly progressive atrophy and chronic small vessel ischemic changes. 3. Left sphenoid sinus disease.  07-06-20: EEG: This study showed evidence of epileptogenic city arising from left temporal region as well as mild diffuse encephalopathy, nonspecific etiology.  No seizures were seen throughout the recording.   07-08-20: chest x-ray:  Left basilar airspace disease could be due to atelectasis or  pneumonia, unchanged. Cardiomegaly without edema. Aortic Atherosclerosis  and Emphysema   NO NEW EXAMS  LABS REVIEWED PREVIOUS  06-29-20: wbc 7.8; hgb 9.9 hct 32.0; mcv 94.4 plt 210; glucose 116; bun 59; creat 1.70; k+ 5.2; na++ 134; ca 9.1 liver normal albumin 3.8  07-01-20; wbc 5.8; hgb 8.8; hct 28.9 mcv 93.8 plt 203; glucose 81; bun 55; creat 1.45; k+ 4.0; na++ 138; ca 9.3 liver normal albumin 3.2 mag 2.0; phos 3.3 07-04-20: wbc 12.3; hgb 9.3; hct 29.1; mcv 90.9 plt 182 07-07-20: wbc 6.1; hgb 8.5; hct 28.0; mcv 95.2 plt 181; glucose 90; bun 63; creat 1.38; k+ 4.0; na++ 138; ca 8.6 mag 2.1; phos 3.8  07-08-20: wbc 7.2; hgb 9.2; hct 30.7; mcv 95.6 plt 190; glucose 121; bun 65; creat 1.83; k+ 4.2; na++ 137; ca 8.9 BNP 913.0; INR 3.2 07-09-20: INR 3.6 07-13-20: INR 1.8  07-16-20; INR 1.3 on 10 mg daily  07-19-20: INR 1.4  07-22-20: INR 1.8  07-26-20: INR 2.3  07-27-20: INR 2.3 08-02-20: INR 3.3 on 12 mg coumadin    NO NEW LABS.    Review of Systems  Constitutional: Negative for malaise/fatigue.  Respiratory: Negative for cough and shortness of breath.   Cardiovascular: Negative  for chest pain, palpitations and leg swelling.  Gastrointestinal: Negative for abdominal pain, constipation and heartburn.  Musculoskeletal: Negative for back pain, joint pain and myalgias.  Skin: Negative.   Neurological: Negative for dizziness.  Psychiatric/Behavioral: The patient is not nervous/anxious.     Physical Exam Constitutional:      General: She is not in acute distress.    Appearance: She is well-developed. She is not diaphoretic.  Neck:     Thyroid: No thyromegaly.  Cardiovascular:     Rate and Rhythm: Normal rate and regular rhythm.     Heart sounds: Normal heart sounds.     Comments: History of aortic and mitral valve replacements Pacemaker ICD placement  Pulmonary:     Effort: Pulmonary effort is normal. No respiratory distress.     Breath sounds: Normal breath sounds.     Comments:  02 Abdominal:     General: Bowel sounds are normal. There is no distension.     Palpations: Abdomen is soft.     Tenderness: There is no abdominal tenderness.  Musculoskeletal:        General: Normal range of motion.     Cervical back: Neck supple.     Left lower leg: No edema.  Lymphadenopathy:     Cervical: No cervical adenopathy.  Skin:    General: Skin is warm and dry.  Neurological:     Mental Status: She is alert and oriented to person, place, and time.  Psychiatric:        Mood and Affect: Mood normal.      ASSESSMENT/ PLAN:  TODAY  1. Type 2 diabetes mellitus with hypoglycemia without coma without long term current use of insulin: is stable will continue to monitor her status.   2. 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 Chronic constipation: is stable will continue prn miralax and senna 2 tabs.   PREVIOUS   4. Chronic anticoagulation on coumadin for mechanical AVR and MVR: INR 2.3  will continue long term coumadin at 12 mg daily;  therapy goal is 2.5-3.5.   5. Hypokalemia: is stable k+ 4.2 will continue k+ 40 meq daily   6. Acute on chronic systolic congestive heart failure: is stable EF 30-35% (01-31-20) will continue lasix 40 mg daily toprol xl 100 mg daily has prn ntg  7. CHB (complete heart block) is status post pacemaker/icd will monitor   8. Persistent atrial fibrillation heart rate is stable will continue toprol xl 100 mg daily for rate control and is on long term coumadin therapy.  9. Chronic bronchitis unspecified bronchitis type: is stable is 02 dependent will continue flonasx daily breo ellipta 100/25 mcg 1 puff daily; albuterol neb twice daily and albuterol inhaler 1 puff every 6 hours as needed  10. Seizure: no reports of further seizure activity will continue keppra 500 mg twice daily   11. CKD (chronic kidney disease) stage IV: is stable bun 65; creat 1.83 will  monitor   12. Hypothyroidism postsurgical: is stable will continue synthroid 75 mcg daily   13. Hypertensive heart disease and chronic kidney disease with chronic systolic congestive heart failure and stage IV chronic kidney disease: is stable b/p 134/67 will continue toprol xl 100 mg daily   14.hyperlipidemia associated with type 2 diabetes mellitus: is stable will continue pravachol 40 mg daily      MD is aware of resident's narcotic use and is in agreement with  current plan of care. We will attempt to wean resident as appropriate.  Ok Edwards NP [facility]  Contact [phone] Monday through Friday 8am- 5pm  After hours call [phone]

## 2020-08-08 LAB — URINE CULTURE: Culture: <10000 — AB

## 2020-08-09 ENCOUNTER — Other Ambulatory Visit: Payer: Self-pay | Admitting: *Deleted

## 2020-08-09 ENCOUNTER — Other Ambulatory Visit (HOSPITAL_COMMUNITY)
Admission: RE | Admit: 2020-08-09 | Discharge: 2020-08-09 | Disposition: A | Discharge status: Home / Self Care | Payer: Medicare Other | Source: Skilled Nursing Facility | Attending: Adult Health | Admitting: Adult Health

## 2020-08-09 ENCOUNTER — Encounter: Payer: Self-pay | Admitting: Adult Health

## 2020-08-09 ENCOUNTER — Non-Acute Institutional Stay (SKILLED_NURSING_FACILITY): Payer: Medicare Other | Admitting: Adult Health

## 2020-08-09 DIAGNOSIS — Z7901 Long term (current) use of anticoagulants: Secondary | ICD-10-CM | POA: Diagnosis not present

## 2020-08-09 DIAGNOSIS — I4819 Other persistent atrial fibrillation: Secondary | ICD-10-CM | POA: Diagnosis not present

## 2020-08-09 DIAGNOSIS — Z952 Presence of prosthetic heart valve: Secondary | ICD-10-CM | POA: Diagnosis not present

## 2020-08-09 DIAGNOSIS — K5909 Other constipation: Secondary | ICD-10-CM | POA: Insufficient documentation

## 2020-08-09 LAB — BASIC METABOLIC PANEL
Anion gap: 6 (ref 5–15)
BUN: 40 mg/dL — ABNORMAL HIGH (ref 8–23)
CO2: 33 mmol/L — ABNORMAL HIGH (ref 22–32)
Calcium: 8.8 mg/dL — ABNORMAL LOW (ref 8.9–10.3)
Chloride: 98 mmol/L (ref 98–111)
Creatinine, Ser: 1.44 mg/dL — ABNORMAL HIGH (ref 0.44–1.00)
GFR, Estimated: 35 mL/min — ABNORMAL LOW (ref >60)
Glucose, Bld: 88 mg/dL (ref 70–99)
Potassium: 4.9 mmol/L (ref 3.5–5.1)
Sodium: 137 mmol/L (ref 135–145)

## 2020-08-09 LAB — PROTIME-INR
INR: 4.8 (ref 0.8–1.2)
Prothrombin Time: 43.5 seconds — ABNORMAL HIGH (ref 11.4–15.2)

## 2020-08-09 NOTE — Patient Outreach (Signed)
[facility] Post- Acute Care Coordinator follow up. Member screened for potential [facility] Care Management needs as a benefit of [place] [employer].  Verified in Patient [name] [name] that member resides in [facility].   Communication sent to SNF SW to inquire about transition plans and potential [facility] Care Management needs.   Will continue to follow while member resides in SNF.    Marthenia Rolling, MSN-Ed, RN,BSN [place] Acute Care Coordinator [phone] [facility])

## 2020-08-09 NOTE — Progress Notes (Signed)
Location:    [facility] Room Number: 151/P Place of Service:  SNF (31)   CODE STATUS: DNR  Allergies  Allergen Reactions  . Penicillins Other (See Comments)    Bruise    . Aspirin Other (See Comments)    On coumadin  . Fentanyl Dermatitis    Rash with patch   . Niacin Itching    Chief Complaint  Patient presents with  . Anticoagulation    INR Visit    HPI:  She is on long term coumadin therapy for afib and valve replacements. Her INR goal is 2.5-3.5.  Her INR today is 4.8 and she is taking coumadin 12 mg daily. There are no repots of missed doses; no reports of bleed present. There are no reports of chest pain or palpitations.   Past Medical History:  Diagnosis Date  . Acute on chronic diastolic CHF (congestive heart failure) ([place]) 05/19/2012  . Acute on chronic respiratory failure with hypoxia ([place]) 01/31/2020  . Adenomatous polyp of colon 12/16/2002   Dr. [name]/[facility]  . Allergy   . Calculus of gallbladder with chronic cholecystitis without obstruction 02/09/2009   Qualifier: Diagnosis of  By: [name], [name]    . Cataract   . CHB (complete heart block) ([place]) 10/07/2014      . CHF (congestive heart failure) ([place])    a.  EF previously reduced to 25-30% b. EF improved to 60-65% by echo in 10/2017.   Marland Kitchen Chronic kidney disease, stage I 2006  . Constipation       . COPD (chronic obstructive pulmonary disease) ([place])       . DJD (degenerative joint disease) of lumbar spine   . DM (diabetes mellitus) ([place]) 2006  . Esophageal reflux   . Glaucoma   . Gout       . H/O adenomatous polyp of colon 05/24/2012   2004 in [place] colonoscopy 2008 normal    . H/O heart valve replacement with mechanical valve    a. s/p mechanical AVR in 2001 and mechanical MVR in 2004.  Marland Kitchen H/O wisdom tooth extraction   . HIP PAIN, RIGHT 04/23/2009   Qualifier: Diagnosis of  By: [name], [name]    . Hyperlipemia   . IDA  (iron deficiency anemia)    Parenteral iron/[name]  . Insomnia       . Nausea without vomiting 06/07/2010   Qualifier: Diagnosis of  By: [name], [name]    . Non-ischemic cardiomyopathy ([place])    a. s/p MDT CRTD  . Obesity, unspecified   . Oxygen deficiency 2014   nocturnal;  . Papilloma of breast 03/06/2012   This was diagnosed as a left breast papilloma by ultrasound characteristics and symptoms in 2010. Because of possible medical risk factors no surgical intervention has been done and it did doing annual followups. The area has been stable by physical examination and mammograms and ultrasound since 2010.   Marland Kitchen Spinal stenosis of lumbar region 02/19/2013  . Spondylolisthesis   . Spondylosis   . Thyroid cancer ([place])    remote thyroidectomy, no recurrence, pt denies in 2016 thyroid cancer  . Type 2 diabetes mellitus with hyperglycemia ([place]) 07/15/2010  . Unspecified hypothyroidism       . 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 Vertigo         Past Surgical History:  Procedure Laterality Date  . AORTIC AND MITRAL VALVE  REPLACEMENT  2001  . BI-VENTRICULAR IMPLANTABLE CARDIOVERTER DEFIBRILLATOR UPGRADE N/A 01/18/2015   Procedure: BI-VENTRICULAR IMPLANTABLE CARDIOVERTER DEFIBRILLATOR UPGRADE;  Surgeon: Evans Lance, MD;  Location: [facility] CATH LAB;  Service: Cardiovascular;  Laterality: N/A;  . BIV ICD GENERATOR CHANGEOUT N/A 01/30/2019   Procedure: BIV ICD GENERATOR CHANGEOUT;  Surgeon: Evans Lance, MD;  Location: [facility] CV LAB;  Service: Cardiovascular;  Laterality: N/A;  . CARDIAC CATHETERIZATION    . CARDIAC VALVE REPLACEMENT    . CARDIOVERSION N/A 11/13/2016   Procedure: CARDIOVERSION;  Surgeon: Sanda Klein, MD;  Location: [facility] ENDOSCOPY;  Service: Cardiovascular;  Laterality: N/A;  . COLONOSCOPY  06/12/2007   Rourk- Normal rectum/Normal colon  . COLONOSCOPY  12/16/02   small hemorrhoids  . COLONOSCOPY  06/20/2012   Procedure: COLONOSCOPY;   Surgeon: Daneil Dolin, MD;  Location: [facility] ENDO SUITE;  Service: Endoscopy;  Laterality: N/A;  10:45  . defibrillator implanted 2006    . DOPPLER ECHOCARDIOGRAPHY  2012  . DOPPLER ECHOCARDIOGRAPHY  05,06,07,08,09,2011  . ESOPHAGOGASTRODUODENOSCOPY  06/12/2007   Rourk- normal esophagus, small hiatal hernia, otherwise normal stomach, D1, D2  . EYE SURGERY Right 2005  . EYE SURGERY Left 2006  . ICD LEAD REMOVAL Left 02/08/2015   Procedure: ICD LEAD REMOVAL;  Surgeon: Evans Lance, MD;  Location[facility] OR;  Service: Cardiovascular;  Laterality: Left;  "Will plan extraction and insertion of a BiV PM"  **Dr. Roxan Hockey backing up case**  . IMPLANTABLE CARDIOVERTER DEFIBRILLATOR (ICD) GENERATOR CHANGE Left 02/08/2015   Procedure: ICD GENERATOR CHANGE;  Surgeon: Evans Lance, MD;  Location: [place];  Service: Cardiovascular;  Laterality: Left;  . INSERT / REPLACE / REMOVE PACEMAKER    . PACEMAKER INSERTION  May 2016  . pacemaker placed  2004  . right breast cyst removed     benign   . right cataract removed     2005  . TEE WITHOUT CARDIOVERSION N/A 11/13/2016   Procedure: TRANSESOPHAGEAL ECHOCARDIOGRAM (TEE);  Surgeon: Sanda Klein, MD;  Location: [facility] ENDOSCOPY;  Service: Cardiovascular;  Laterality: N/A;  . THYROIDECTOMY      Social History   Socioeconomic History  . Marital status: Widowed    Spouse name: Not on file  . Number of children: 0  . Years of education: Not on file  . Highest education level: Not on file  Occupational History  . Occupation: retired    Fish farm manager: RETIRED  Tobacco Use  . Smoking status: Former Smoker    Packs/day: 0.75    Years: 40.00    Pack years: 30.00    Types: Cigarettes    Quit date: 03/08/1987    Years since quitting: 33.4  . Smokeless tobacco: Never Used  Vaping Use  . Vaping Use: Never used  Substance and Sexual Activity  . Alcohol use: No    Alcohol/week: 0.0 standard drinks  . Drug use: No  . Sexual activity: Not Currently  Other Topics  Concern  . Not on file  Social History Narrative   From [place] (2004)   Social Determinants of Health   Financial Resource Strain: Low Risk   . Difficulty of Paying Living Expenses: Not hard at all  Food Insecurity: No Food Insecurity  . Worried About Charity fundraiser in the Last Year: Never true  . Ran Out of Food in the Last Year: Never true  Transportation Needs: No Transportation Needs  . Lack of Transportation (Medical): No  . Lack of Transportation (Non-Medical): No  Physical  Activity: Inactive  . Days of Exercise per Week: 0 days  . Minutes of Exercise per Session: 0 min  Stress: No Stress Concern Present  . Feeling of Stress : Only a little  Social Connections: Moderately Isolated  . Frequency of Communication with Friends and Family: More than three times a week  . Frequency of Social Gatherings with Friends and Family: Twice a week  . Attends Religious Services: More than 4 times per year  . Active Member of Clubs or Organizations: No  . Attends Archivist Meetings: Never  . Marital Status: Widowed  Intimate Partner Violence:   . Fear of Current or Ex-Partner: Not on file  . Emotionally Abused: Not on file  . Physically Abused: Not on file  . Sexually Abused: Not on file   Family History  Problem Relation Age of Onset  . Pancreatic cancer Mother   . Heart disease Father   . Heart disease Sister   . Heart attack Sister   . Heart disease Brother   . Diabetes Brother   . Heart disease Brother   . Diabetes Brother   . Hypertension Brother   . Lung cancer Brother       VITAL SIGNS BP 125/78   Pulse (!) 59   Temp 98.4 F (36.9 C)   Resp 20   Ht 5\' 7"  (1.702 m)   Wt 197 lb 6.4 oz (89.5 kg)   SpO2 96%   BMI 30.92 kg/m   Facility-Administered Encounter Medications as of 08/09/2020  Medication  . epoetin alfa (EPOGEN,PROCRIT) injection 40,000 Units   Outpatient Encounter Medications as of 08/09/2020  Medication Sig  . acetaminophen  (TYLENOL) 500 MG tablet Take 1 tablet (500 mg total) by mouth every 6 (six) hours as needed (pain).  Marland Kitchen albuterol (PROVENTIL) (2.5 MG/3ML) 0.083% nebulizer solution Take 3 mLs (2.5 mg total) by nebulization in the morning and at bedtime. *May use additional one time if needed for shortness of breath  . albuterol (VENTOLIN HFA) 108 (90 Base) MCG/ACT inhaler Inhale 1 puff into the lungs every 6 (six) hours as needed for wheezing or shortness of breath.  . allopurinol (ZYLOPRIM) 100 MG tablet TAKE 1 TABLET(100 MG) BY MOUTH DAILY  . BREO ELLIPTA 100-25 MCG/INH AEPB 1 TAKE 1 INHALATION BY MOUTH ONCE DAILY  . cholecalciferol (VITAMIN D3) 25 MCG (1000 UNIT) tablet Take 1 tablet (1,000 Units total) by mouth in the morning.  . fluticasone (FLONASE) 50 MCG/ACT nasal spray Place 2 sprays into both nostrils daily.  . folic acid (FOLVITE) 1 MG tablet TAKE 1 TABLET(1 MG) BY MOUTH DAILY  . furosemide (LASIX) 40 MG tablet Take 40 mg by mouth daily. Start 07/13/2020  . gabapentin (NEURONTIN) 300 MG capsule TAKE 1 CAPSULE(300 MG) BY MOUTH TWICE DAILY  . levETIRAcetam (KEPPRA) 500 MG tablet Take 500 mg by mouth 2 (two) times daily. 9 am and 9 pm  . Liniments (SALONPAS PAIN RELIEF PATCH EX) Apply 1 patch topically daily as needed (pain).   . metoprolol tartrate (LOPRESSOR) 50 MG tablet Take 50 mg by mouth 2 (two) times daily. 9 am & 9 pm  . nitroGLYCERIN (NITROSTAT) 0.4 MG SL tablet Place 1 tablet (0.4 mg total) under the tongue every 5 (five) minutes as needed for chest pain.  . NON FORMULARY Diet Change: Soft diet with dys 2 (ground) meats, thin liquids (NAS, cons CHO, heart healthy)  . ondansetron (ZOFRAN ODT) 4 MG disintegrating tablet Take 1 tablet (4 mg total) by  mouth every 8 (eight) hours as needed for nausea or vomiting.  . OXYGEN Inhale 3 L into the lungs continuous.  [name] [name] (SYSTANE) 0.4-0.3 % SOLN Apply 1 drop to eye at bedtime. Left eye  . polyethylene glycol (MIRALAX / GLYCOLAX)  17 g packet Take 17 g by mouth daily as needed.  . potassium chloride SA (KLOR-CON) 20 MEQ tablet Take 20 mEq by mouth daily. 9 am  . pravastatin (PRAVACHOL) 40 MG tablet Take 1 tablet (40 mg total) by mouth daily.  Marland Kitchen senna (SENOKOT) 8.6 MG TABS tablet Take 2 tablets (17.2 mg total) by mouth daily as needed for mild constipation.  Marland Kitchen SYNTHROID 75 MCG tablet Take 1 tablet (75 mcg total) by mouth daily before breakfast.  . [DISCONTINUED] warfarin (COUMADIN) 10 MG tablet Take 10 mg by mouth every evening. Take along with 2 mg for a total dose of 12 mg Special Instructions: INR goal 2.5-3.5 due to valve replacements  . [DISCONTINUED] warfarin (COUMADIN) 2 MG tablet Take 2 mg by mouth every evening. Take along with 10 mg for a total dose of 12 mg     SIGNIFICANT DIAGNOSTIC EXAMS   PREVIOUS  06-29-20 chest x-ray:  1. Stable cardiomegaly. 2. Unchanged interstitial coarsening, this is similar to multiple prior exams. Septal thickening on prior CT suggests an element of pulmonary edema. There is background emphysema on CT. 3. No new or acute airspace disease  07-03-20: ct of head:  1. No acute intracranial findings. 2. Mildly progressive atrophy and chronic small vessel ischemic changes. 3. Left sphenoid sinus disease.  07-06-20: EEG: This study showed evidence of epileptogenic city arising from left temporal region as well as mild diffuse encephalopathy, nonspecific etiology.  No seizures were seen throughout the recording.   07-08-20: chest x-ray:  Left basilar airspace disease could be due to atelectasis or pneumonia, unchanged. Cardiomegaly without edema. Aortic Atherosclerosis  and Emphysema   NO NEW EXAMS  LABS REVIEWED PREVIOUS  06-29-20: wbc 7.8; hgb 9.9 hct 32.0; mcv 94.4 plt 210; glucose 116; bun 59; creat 1.70; k+ 5.2; na++ 134; ca 9.1 liver normal albumin 3.8  07-01-20; wbc 5.8; hgb 8.8; hct 28.9 mcv 93.8 plt 203; glucose 81; bun 55; creat 1.45; k+ 4.0; na++ 138; ca 9.3 liver  normal albumin 3.2 mag 2.0; phos 3.3 07-04-20: wbc 12.3; hgb 9.3; hct 29.1; mcv 90.9 plt 182 07-07-20: wbc 6.1; hgb 8.5; hct 28.0; mcv 95.2 plt 181; glucose 90; bun 63; creat 1.38; k+ 4.0; na++ 138; ca 8.6 mag 2.1; phos 3.8  07-08-20: wbc 7.2; hgb 9.2; hct 30.7; mcv 95.6 plt 190; glucose 121; bun 65; creat 1.83; k+ 4.2; na++ 137; ca 8.9 BNP 913.0; INR 3.2 07-09-20: INR 3.6 07-13-20: INR 1.8  07-16-20; INR 1.3 on 10 mg daily  07-19-20: INR 1.4  07-22-20: INR 1.8  07-26-20: INR 2.3  07-27-20: INR 2.3 08-02-20: INR 3.3 on 12 mg coumadin    TODAY  08-09-20; INR 4.8     Review of Systems  Constitutional: Negative for malaise/fatigue.  Respiratory: Negative for cough and shortness of breath.   Cardiovascular: Negative for chest pain, palpitations and leg swelling.  Gastrointestinal: Negative for abdominal pain, constipation and heartburn.  Musculoskeletal: Negative for back pain, joint pain and myalgias.  Skin: Negative.   Neurological: Negative for dizziness.  Psychiatric/Behavioral: The patient is not nervous/anxious.     Physical Exam Constitutional:      General: She is not in acute distress.    Appearance:  She is well-developed. She is not diaphoretic.  Neck:     Thyroid: No thyromegaly.  Cardiovascular:     Rate and Rhythm: Normal rate and regular rhythm.     Pulses: Normal pulses.     Heart sounds: Normal heart sounds.     Comments: History of aortic and mitral valve replacements Pacemaker ICD placement  Pulmonary:     Effort: Pulmonary effort is normal. No respiratory distress.     Breath sounds: Normal breath sounds.     Comments: 02 Abdominal:     General: Bowel sounds are normal. There is no distension.     Palpations: Abdomen is soft.     Tenderness: There is no abdominal tenderness.  Musculoskeletal:        General: Normal range of motion.     Cervical back: Neck supple.     Right lower leg: No edema.     Left lower leg: No edema.  Lymphadenopathy:      Cervical: No cervical adenopathy.  Skin:    General: Skin is warm and dry.  Neurological:     Mental Status: She is alert and oriented to person, place, and time.  Psychiatric:        Mood and Affect: Mood normal.       ASSESSMENT/ PLAN:  TODAY  1. Persistent atrial fibrillation 2. S/p MVR (mitral valve replacement) 3. Hx of aortic valve replacement; mechanical 4. Chronic anticoagulation on coumadin for mechanical avr and mvr   For INR 4.8 will hold coumadin and will recheck INR in the AM.     MD is aware of resident's narcotic use and is in agreement with current plan of care. We will attempt to wean resident as appropriate.  Ok Edwards NP [facility]  Contact [phone] Monday through Friday 8am- 5pm  After hours call [phone]

## 2020-08-10 ENCOUNTER — Other Ambulatory Visit (HOSPITAL_COMMUNITY)
Admission: RE | Admit: 2020-08-10 | Discharge: 2020-08-10 | Disposition: A | Discharge status: Home / Self Care | Payer: Medicare Other | Source: Skilled Nursing Facility | Attending: Adult Health | Admitting: Adult Health

## 2020-08-10 ENCOUNTER — Non-Acute Institutional Stay: Payer: Self-pay | Admitting: Adult Health

## 2020-08-10 ENCOUNTER — Ambulatory Visit: Payer: Medicare Other | Admitting: Orthopaedic Surgery

## 2020-08-10 ENCOUNTER — Encounter: Payer: Self-pay | Admitting: Adult Health

## 2020-08-10 ENCOUNTER — Non-Acute Institutional Stay (SKILLED_NURSING_FACILITY): Payer: Medicare Other | Admitting: Adult Health

## 2020-08-10 ENCOUNTER — Ambulatory Visit (INDEPENDENT_AMBULATORY_CARE_PROVIDER_SITE_OTHER): Payer: Medicare Other | Admitting: Orthopaedic Surgery

## 2020-08-10 ENCOUNTER — Encounter: Payer: Self-pay | Admitting: Orthopaedic Surgery

## 2020-08-10 DIAGNOSIS — G8929 Other chronic pain: Secondary | ICD-10-CM

## 2020-08-10 DIAGNOSIS — M25512 Pain in left shoulder: Secondary | ICD-10-CM

## 2020-08-10 DIAGNOSIS — Z952 Presence of prosthetic heart valve: Secondary | ICD-10-CM

## 2020-08-10 DIAGNOSIS — I4819 Other persistent atrial fibrillation: Secondary | ICD-10-CM | POA: Diagnosis not present

## 2020-08-10 DIAGNOSIS — Z7901 Long term (current) use of anticoagulants: Secondary | ICD-10-CM

## 2020-08-10 DIAGNOSIS — M25561 Pain in right knee: Secondary | ICD-10-CM

## 2020-08-10 DIAGNOSIS — Z9981 Dependence on supplemental oxygen: Secondary | ICD-10-CM

## 2020-08-10 LAB — PROTIME-INR
INR: 4.5 (ref 0.8–1.2)
Prothrombin Time: 40.9 seconds — ABNORMAL HIGH (ref 11.4–15.2)

## 2020-08-10 NOTE — Progress Notes (Signed)
Location[facility]   [facility] Room Number: 732 Place of Service:  SNF (31)   CODE STATUS:  dnr   Allergies  Allergen Reactions  . Penicillins Other (See Comments)    Bruise    . Aspirin Other (See Comments)    On coumadin  . Fentanyl Dermatitis    Rash with patch   . Niacin Itching    Chief Complaint  Patient presents with  . Acute Visit    INR    HPI:  She is on long term coumadin therapy for afib; avr; mvr. Her INR goal is 2.5-3.5. her INR today is 4.5 yesterday was 4.8.  There are no reports of bleeding present. No reports of palpitations; no reports of cough or shortness of breath. Her coumadin 12 mg was help last night   Past Medical History:  Diagnosis Date  . Acute on chronic diastolic CHF (congestive heart failure) ([place]) 05/19/2012  . Acute on chronic respiratory failure with hypoxia ([place]) 01/31/2020  . Adenomatous polyp of colon 12/16/2002   Dr. [name]/[facility]  . Allergy   . Calculus of gallbladder with chronic cholecystitis without obstruction 02/09/2009   Qualifier: Diagnosis of  By: [name], [name]    . Cataract   . CHB (complete heart block) ([place]) 10/07/2014      . CHF (congestive heart failure) ([employer])    a.  EF previously reduced to 25-30% b. EF improved to 60-65% by echo in 10/2017.   Marland Kitchen Chronic gout due to renal impairment    Per [facility] Electronic Medical Records System   . Chronic kidney disease, stage I 2006  . Cognitive communication deficit    Per [facility] Records [facility]   . Constipation       . Convulsion ([place])    Per [facility] Records [facility]   . COPD (chronic obstructive pulmonary disease) ([place])       . DJD (degenerative joint disease) of lumbar spine   . DM (diabetes mellitus) ([place]) 2006  . Dysphagia, unspecified    Per [facility], [facility] Medical Records [facility]   .  Esophageal reflux   . Glaucoma   . Gout       . H/O adenomatous polyp of colon 05/24/2012   2004 in [place] colonoscopy 2008 normal    . H/O heart valve replacement with mechanical valve    a. s/p mechanical AVR in 2001 and mechanical MVR in 2004.  Marland Kitchen H/O wisdom tooth extraction   . HIP PAIN, RIGHT 04/23/2009   Qualifier: Diagnosis of  By: [name], [name]    . Hyperkalemia    Per [facility] Records [facility]   . Hyperlipemia   . Hypertensive heart and kidney disease with HF and with CKD stage I-IV ([place])    Per [facility] Records [facility]   . IDA (iron deficiency anemia)    Parenteral iron/[name]  . Insomnia       . Localized edema    Left arm, Per [facility] Medical Records [facility]   . Long term (current) use of anticoagulants    Per [facility] Records [facility]   . Nausea without vomiting 06/07/2010   Qualifier: Diagnosis of  By: [name], [name]    . Non-ischemic cardiomyopathy ([place])    a. s/p  MDT CRTD  . Obesity, unspecified   . Oxygen deficiency 2014   nocturnal;  . Papilloma of breast 03/06/2012   This was diagnosed as a left breast papilloma by ultrasound characteristics and symptoms in 2010. Because of possible medical risk factors no surgical intervention has been done and it did doing annual followups. The area has been stable by physical examination and mammograms and ultrasound since 2010.   . Presence of cardiac pacemaker    Per [facility] Electronic Medical Records System   . Spinal stenosis of lumbar region 02/19/2013  . Spondylolisthesis   . Spondylosis   . Thyroid cancer ([place])    remote thyroidectomy, no recurrence, pt denies in 2016 thyroid cancer  . Type 2 diabetes mellitus with hyperglycemia ([place]) 07/15/2010  . Type 2 diabetes mellitus with hypoglycemia without coma ([place])    Per [employer],  [facility] Electronic Medical Records [facility]   . Unspecified chronic bronchitis ([place])    Per [facility] Records [facility]   . Unspecified hypothyroidism       . Urinary tract infection, site not specified    Per [facility] Electronic Medical Records [facility]   . 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 Vertigo         Past Surgical History:  Procedure Laterality Date  . AORTIC AND MITRAL VALVE REPLACEMENT  2001  . BI-VENTRICULAR IMPLANTABLE CARDIOVERTER DEFIBRILLATOR UPGRADE N/A 01/18/2015   Procedure: BI-VENTRICULAR IMPLANTABLE CARDIOVERTER DEFIBRILLATOR UPGRADE;  Surgeon: Evans Lance, MD;  Location: [facility] CATH LAB;  Service: Cardiovascular;  Laterality: N/A;  . BIV ICD GENERATOR CHANGEOUT N/A 01/30/2019   Procedure: BIV ICD GENERATOR CHANGEOUT;  Surgeon: Evans Lance, MD;  Location: [place] CV LAB;  Service: Cardiovascular;  Laterality: N/A;  . CARDIAC CATHETERIZATION    . CARDIAC VALVE REPLACEMENT    . CARDIOVERSION N/A 11/13/2016   Procedure: CARDIOVERSION;  Surgeon: Sanda Klein, MD;  Location: [facility] ENDOSCOPY;  Service: Cardiovascular;  Laterality: N/A;  . COLONOSCOPY  06/12/2007   Rourk- Normal rectum/Normal colon  . COLONOSCOPY  12/16/02   small hemorrhoids  . COLONOSCOPY  06/20/2012   Procedure: COLONOSCOPY;  Surgeon: Daneil Dolin, MD;  Location: [facility];  Service: Endoscopy;  Laterality: N/A;  10:45  . defibrillator implanted 2006    . DOPPLER ECHOCARDIOGRAPHY  2012  . DOPPLER ECHOCARDIOGRAPHY  05,06,07,08,09,2011  . ESOPHAGOGASTRODUODENOSCOPY  06/12/2007   Rourk- normal esophagus, small hiatal hernia, otherwise normal stomach, D1, D2  . EYE SURGERY Right 2005  . EYE SURGERY Left 2006  . ICD LEAD REMOVAL Left 02/08/2015   Procedure: ICD LEAD REMOVAL;  Surgeon: Evans Lance, MD;  Location[facility] OR;  Service: Cardiovascular;  Laterality: Left;  "Will plan extraction and insertion  of a BiV PM"  **Dr. Roxan Hockey backing up case**  . IMPLANTABLE CARDIOVERTER DEFIBRILLATOR (ICD) GENERATOR CHANGE Left 02/08/2015   Procedure: ICD GENERATOR CHANGE;  Surgeon: Evans Lance, MD;  Location: [place];  Service: Cardiovascular;  Laterality: Left;  . INSERT / REPLACE / REMOVE PACEMAKER    . PACEMAKER INSERTION  May 2016  . pacemaker placed  2004  . right breast cyst removed     benign   . right cataract removed     2005  . TEE WITHOUT CARDIOVERSION N/A 11/13/2016   Procedure: TRANSESOPHAGEAL ECHOCARDIOGRAM (TEE);  Surgeon: Sanda Klein, MD;  Location: [place];  Service: Cardiovascular;  Laterality: N/A;  .  THYROIDECTOMY      Social History   Socioeconomic History  . Marital status: Widowed    Spouse name: Not on file  . Number of children: 0  . Years of education: Not on file  . Highest education level: Not on file  Occupational History  . Occupation: retired    Fish farm manager: RETIRED  Tobacco Use  . Smoking status: Former Smoker    Packs/day: 0.75    Years: 40.00    Pack years: 30.00    Types: Cigarettes    Quit date: 03/08/1987    Years since quitting: 33.4  . Smokeless tobacco: Never Used  Vaping Use  . Vaping Use: Never used  Substance and Sexual Activity  . Alcohol use: No    Alcohol/week: 0.0 standard drinks  . Drug use: No  . Sexual activity: Not Currently  Other Topics Concern  . Not on file  Social History Narrative   From [place] (2004)   Social Determinants of Health   Financial Resource Strain: Low Risk   . Difficulty of Paying Living Expenses: Not hard at all  Food Insecurity: No Food Insecurity  . Worried About Charity fundraiser in the Last Year: Never true  . Ran Out of Food in the Last Year: Never true  Transportation Needs: No Transportation Needs  . Lack of Transportation (Medical): No  . Lack of Transportation (Non-Medical): No  Physical Activity: Inactive  . Days of Exercise per Week: 0 days  . Minutes of Exercise per  Session: 0 min  Stress: No Stress Concern Present  . Feeling of Stress : Only a little  Social Connections: Moderately Isolated  . Frequency of Communication with Friends and Family: More than three times a week  . Frequency of Social Gatherings with Friends and Family: Twice a week  . Attends Religious Services: More than 4 times per year  . Active Member of Clubs or Organizations: No  . Attends Archivist Meetings: Never  . Marital Status: Widowed  Intimate Partner Violence:   . Fear of Current or Ex-Partner: Not on file  . Emotionally Abused: Not on file  . Physically Abused: Not on file  . Sexually Abused: Not on file   Family History  Problem Relation Age of Onset  . Pancreatic cancer Mother   . Heart disease Father   . Heart disease Sister   . Heart attack Sister   . Heart disease Brother   . Diabetes Brother   . Heart disease Brother   . Diabetes Brother   . Hypertension Brother   . Lung cancer Brother       VITAL SIGNS BP 125/78   Pulse 68   Temp 98.2 F (36.8 C)   Ht 5\' 7"  (1.702 m)   Wt 199 lb 6.4 oz (90.4 kg)   BMI 31.23 kg/m   Facility-Administered Encounter Medications as of 08/10/2020  Medication  . epoetin alfa (EPOGEN,PROCRIT) injection 40,000 Units   Outpatient Encounter Medications as of 08/10/2020  Medication Sig  . acetaminophen (TYLENOL) 500 MG tablet Take 1 tablet (500 mg total) by mouth every 6 (six) hours as needed (pain).  Marland Kitchen albuterol (PROVENTIL) (2.5 MG/3ML) 0.083% nebulizer solution Take 3 mLs (2.5 mg total) by nebulization in the morning and at bedtime. *May use additional one time if needed for shortness of breath  . albuterol (VENTOLIN HFA) 108 (90 Base) MCG/ACT inhaler Inhale 1 puff into the lungs every 6 (six) hours as needed for wheezing or shortness of  breath.  . allopurinol (ZYLOPRIM) 100 MG tablet TAKE 1 TABLET(100 MG) BY MOUTH DAILY  . BREO ELLIPTA 100-25 MCG/INH AEPB 1 TAKE 1 INHALATION BY MOUTH ONCE DAILY  .  cholecalciferol (VITAMIN D3) 25 MCG (1000 UNIT) tablet Take 1 tablet (1,000 Units total) by mouth in the morning.  . fluticasone (FLONASE) 50 MCG/ACT nasal spray Place 2 sprays into both nostrils daily.  . folic acid (FOLVITE) 1 MG tablet TAKE 1 TABLET(1 MG) BY MOUTH DAILY  . furosemide (LASIX) 40 MG tablet Take 40 mg by mouth daily. Start 07/13/2020  . gabapentin (NEURONTIN) 300 MG capsule TAKE 1 CAPSULE(300 MG) BY MOUTH TWICE DAILY  . levETIRAcetam (KEPPRA) 500 MG tablet Take 500 mg by mouth 2 (two) times daily. 9 am and 9 pm  . Liniments (SALONPAS PAIN RELIEF PATCH EX) Apply 1 patch topically daily as needed (pain).   . metoprolol tartrate (LOPRESSOR) 50 MG tablet Take 50 mg by mouth 2 (two) times daily. 9 am & 9 pm  . nitroGLYCERIN (NITROSTAT) 0.4 MG SL tablet Place 1 tablet (0.4 mg total) under the tongue every 5 (five) minutes as needed for chest pain.  . NON FORMULARY Diet Change: Soft diet with dys 2 (ground) meats, thin liquids (NAS, cons CHO, heart healthy)  . ondansetron (ZOFRAN ODT) 4 MG disintegrating tablet Take 1 tablet (4 mg total) by mouth every 8 (eight) hours as needed for nausea or vomiting.  . OXYGEN Inhale 3 L into the lungs continuous.  [name] [name] (SYSTANE) 0.4-0.3 % SOLN Apply 1 drop to eye at bedtime. Left eye  . polyethylene glycol (MIRALAX / GLYCOLAX) 17 g packet Take 17 g by mouth daily as needed.  . potassium chloride SA (KLOR-CON) 20 MEQ tablet Take 20 mEq by mouth daily. 9 am  . pravastatin (PRAVACHOL) 40 MG tablet Take 1 tablet (40 mg total) by mouth daily.  Marland Kitchen senna (SENOKOT) 8.6 MG TABS tablet Take 2 tablets (17.2 mg total) by mouth daily as needed for mild constipation.  Marland Kitchen SYNTHROID 75 MCG tablet Take 1 tablet (75 mcg total) by mouth daily before breakfast.     SIGNIFICANT DIAGNOSTIC EXAMS  PREVIOUS  06-29-20 chest x-ray:  1. Stable cardiomegaly. 2. Unchanged interstitial coarsening, this is similar to multiple prior exams. Septal  thickening on prior CT suggests an element of pulmonary edema. There is background emphysema on CT. 3. No new or acute airspace disease  07-03-20: ct of head:  1. No acute intracranial findings. 2. Mildly progressive atrophy and chronic small vessel ischemic changes. 3. Left sphenoid sinus disease.  07-06-20: EEG: This study showed evidence of epileptogenic city arising from left temporal region as well as mild diffuse encephalopathy, nonspecific etiology.  No seizures were seen throughout the recording.   07-08-20: chest x-ray:  Left basilar airspace disease could be due to atelectasis or pneumonia, unchanged. Cardiomegaly without edema. Aortic Atherosclerosis  and Emphysema   NO NEW EXAMS  LABS REVIEWED PREVIOUS  06-29-20: wbc 7.8; hgb 9.9 hct 32.0; mcv 94.4 plt 210; glucose 116; bun 59; creat 1.70; k+ 5.2; na++ 134; ca 9.1 liver normal albumin 3.8  07-01-20; wbc 5.8; hgb 8.8; hct 28.9 mcv 93.8 plt 203; glucose 81; bun 55; creat 1.45; k+ 4.0; na++ 138; ca 9.3 liver normal albumin 3.2 mag 2.0; phos 3.3 07-04-20: wbc 12.3; hgb 9.3; hct 29.1; mcv 90.9 plt 182 07-07-20: wbc 6.1; hgb 8.5; hct 28.0; mcv 95.2 plt 181; glucose 90; bun 63; creat 1.38; k+ 4.0; na++ 138;  ca 8.6 mag 2.1; phos 3.8  07-08-20: wbc 7.2; hgb 9.2; hct 30.7; mcv 95.6 plt 190; glucose 121; bun 65; creat 1.83; k+ 4.2; na++ 137; ca 8.9 BNP 913.0; INR 3.2 07-09-20: INR 3.6 07-13-20: INR 1.8  07-16-20; INR 1.3 on 10 mg daily  07-19-20: INR 1.4  07-22-20: INR 1.8  07-26-20: INR 2.3  07-27-20: INR 2.3 08-02-20: INR 3.3 on 12 mg coumadin    TODAY  08-09-20; INR 4.8  08-10-20: INR 4.5     Review of Systems  Constitutional: Negative for malaise/fatigue.  Respiratory: Negative for cough and shortness of breath.   Cardiovascular: Negative for chest pain, palpitations and leg swelling.  Gastrointestinal: Negative for abdominal pain, constipation and heartburn.  Musculoskeletal: Negative for back pain, joint pain and myalgias.   Skin: Negative.   Neurological: Negative for dizziness.  Psychiatric/Behavioral: The patient is not nervous/anxious.      Physical Exam Constitutional:      General: She is not in acute distress.    Appearance: She is well-developed. She is obese. She is not diaphoretic.  Neck:     Thyroid: No thyromegaly.  Cardiovascular:     Rate and Rhythm: Normal rate and regular rhythm.     Pulses: Normal pulses.     Heart sounds: Normal heart sounds.     Comments:  History of aortic and mitral valve replacements Pacemaker ICD placement  Pulmonary:     Effort: Pulmonary effort is normal. No respiratory distress.     Breath sounds: Normal breath sounds.     Comments: 02 Abdominal:     General: Bowel sounds are normal. There is no distension.     Palpations: Abdomen is soft.     Tenderness: There is no abdominal tenderness.  Musculoskeletal:        General: Normal range of motion.     Cervical back: Neck supple.     Right lower leg: No edema.     Left lower leg: No edema.  Lymphadenopathy:     Cervical: No cervical adenopathy.  Skin:    General: Skin is warm and dry.  Neurological:     Mental Status: She is alert and oriented to person, place, and time.  Psychiatric:        Mood and Affect: Mood normal.       ASSESSMENT/ PLAN:  TODAY  1. Persistent atrial fibrillation 2. S/p MVR (mitral valve replacement) 3. Hx of aortic valve replacement; mechanical 4. Chronic anticoagulation on coumadin for mechanical avr and mvr  For INR 4.5 will continue to hold coumadin and will recheck INR in the AM.      MD is aware of resident's narcotic use and is in agreement with current plan of care. We will attempt to wean resident as appropriate.  Ok Edwards NP [facility]  Contact [phone] Monday through Friday 8am- 5pm  After hours call [phone]

## 2020-08-10 NOTE — Progress Notes (Signed)
Location:    [address] Room Number: 151-P Place of Service:  SNF (31)   CODE STATUS: DNR  Allergies  Allergen Reactions  . Penicillins Other (See Comments)    Bruise    . Aspirin Other (See Comments)    On coumadin  . Fentanyl Dermatitis    Rash with patch   . Niacin Itching    Chief Complaint  Patient presents with  . Anticoagulation    PT/INR follow-up     HPI:  She is on long term coumadin therapy for her afib; and heart valve replacements. Her INR goal is 2.5-3.5. her coumadin was placed on hold yesterday for INR of 4.8. her INR today is 4.5. she denies any chest pain; no palpitations; no shortness of breath. There is no report of bleeding present.   Past Medical History:  Diagnosis Date  . Acute on chronic diastolic CHF (congestive heart failure) ([place]) 05/19/2012  . Acute on chronic respiratory failure with hypoxia ([place]) 01/31/2020  . Adenomatous polyp of colon 12/16/2002   Dr. [name]/[facility]  . Allergy   . Calculus of gallbladder with chronic cholecystitis without obstruction 02/09/2009   Qualifier: Diagnosis of  By: [name], [name]    . Cataract   . CHB (complete heart block) ([place]) 10/07/2014      . CHF (congestive heart failure) ([place])    a.  EF previously reduced to 25-30% b. EF improved to 60-65% by echo in 10/2017.   Marland Kitchen Chronic kidney disease, stage I 2006  . Constipation       . COPD (chronic obstructive pulmonary disease) ([place])       . DJD (degenerative joint disease) of lumbar spine   . DM (diabetes mellitus) ([place]) 2006  . Esophageal reflux   . Glaucoma   . Gout       . H/O adenomatous polyp of colon 05/24/2012   2004 in [place] colonoscopy 2008 normal    . H/O heart valve replacement with mechanical valve    a. s/p mechanical AVR in 2001 and mechanical MVR in 2004.  Marland Kitchen H/O wisdom tooth extraction   . HIP PAIN, RIGHT 04/23/2009   Qualifier: Diagnosis of  By: [name], [name]      . Hyperlipemia   . IDA (iron deficiency anemia)    Parenteral iron/[name]  . Insomnia       . Nausea without vomiting 06/07/2010   Qualifier: Diagnosis of  By: [name], [name]    . Non-ischemic cardiomyopathy ([place])    a. s/p MDT CRTD  . Obesity, unspecified   . Oxygen deficiency 2014   nocturnal;  . Papilloma of breast 03/06/2012   This was diagnosed as a left breast papilloma by ultrasound characteristics and symptoms in 2010. Because of possible medical risk factors no surgical intervention has been done and it did doing annual followups. The area has been stable by physical examination and mammograms and ultrasound since 2010.   Marland Kitchen Spinal stenosis of lumbar region 02/19/2013  . Spondylolisthesis   . Spondylosis   . Thyroid cancer ([place])    remote thyroidectomy, no recurrence, pt denies in 2016 thyroid cancer  . Type 2 diabetes mellitus with hyperglycemia ([place]) 07/15/2010  . Unspecified hypothyroidism       . 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 Vertigo         Past Surgical History:  Procedure Laterality Date  . AORTIC  AND MITRAL VALVE REPLACEMENT  2001  . BI-VENTRICULAR IMPLANTABLE CARDIOVERTER DEFIBRILLATOR UPGRADE N/A 01/18/2015   Procedure: BI-VENTRICULAR IMPLANTABLE CARDIOVERTER DEFIBRILLATOR UPGRADE;  Surgeon: Evans Lance, MD;  Location: [facility] CATH LAB;  Service: Cardiovascular;  Laterality: N/A;  . BIV ICD GENERATOR CHANGEOUT N/A 01/30/2019   Procedure: BIV ICD GENERATOR CHANGEOUT;  Surgeon: Evans Lance, MD;  Location: [place] CV LAB;  Service: Cardiovascular;  Laterality: N/A;  . CARDIAC CATHETERIZATION    . CARDIAC VALVE REPLACEMENT    . CARDIOVERSION N/A 11/13/2016   Procedure: CARDIOVERSION;  Surgeon: Sanda Klein, MD;  Location: [facility] ENDOSCOPY;  Service: Cardiovascular;  Laterality: N/A;  . COLONOSCOPY  06/12/2007   Rourk- Normal rectum/Normal colon  . COLONOSCOPY  12/16/02   small hemorrhoids  . COLONOSCOPY  06/20/2012    Procedure: COLONOSCOPY;  Surgeon: Daneil Dolin, MD;  Location: [facility] ENDO SU[facility];  Service: Endoscopy;  Laterality: N/A;  10:45  . defibrillator implanted 2006    . DOPPLER ECHOCARDIOGRAPHY  2012  . DOPPLER ECHOCARDIOGRAPHY  05,06,07,08,09,2011  . ESOPHAGOGASTRODUODENOSCOPY  06/12/2007   Rourk- normal esophagus, small hiatal hernia, otherwise normal stomach, D1, D2  . EYE SURGERY Right 2005  . EYE SURGERY Left 2006  . ICD LEAD REMOVAL Left 02/08/2015   Procedure: ICD LEAD REMOVAL;  Surgeon: Evans Lance, MD;  Location: [facility] OR;  Service: Cardiovascular;  Laterality: Left;  "Will plan extraction and insertion of a BiV PM"  **Dr. Roxan Hockey backing up case**  . IMPLANTABLE CARDIOVERTER DEFIBRILLATOR (ICD) GENERATOR CHANGE Left 02/08/2015   Procedure: ICD GENERATOR CHANGE;  Surgeon: Evans Lance, MD;  Location: [place];  Service: Cardiovascular;  Laterality: Left;  . INSERT / REPLACE / REMOVE PACEMAKER    . PACEMAKER INSERTION  May 2016  . pacemaker placed  2004  . right breast cyst removed     benign   . right cataract removed     2005  . TEE WITHOUT CARDIOVERSION N/A 11/13/2016   Procedure: TRANSESOPHAGEAL ECHOCARDIOGRAM (TEE);  Surgeon: Sanda Klein, MD;  Location: [facility] ENDOSCOPY;  Service: Cardiovascular;  Laterality: N/A;  . THYROIDECTOMY      Social History   Socioeconomic History  . Marital status: Widowed    Spouse name: Not on file  . Number of children: 0  . Years of education: Not on file  . Highest education level: Not on file  Occupational History  . Occupation: retired    Fish farm manager: RETIRED  Tobacco Use  . Smoking status: Former Smoker    Packs/day: 0.75    Years: 40.00    Pack years: 30.00    Types: Cigarettes    Quit date: 03/08/1987    Years since quitting: 33.4  . Smokeless tobacco: Never Used  Vaping Use  . Vaping Use: Never used  Substance and Sexual Activity  . Alcohol use: No    Alcohol/week: 0.0 standard drinks  . Drug use: No  . Sexual activity:  Not Currently  Other Topics Concern  . Not on file  Social History Narrative   From [place] (2004)   Social Determinants of Health   Financial Resource Strain: Low Risk   . Difficulty of Paying Living Expenses: Not hard at all  Food Insecurity: No Food Insecurity  . Worried About Charity fundraiser in the Last Year: Never true  . Ran Out of Food in the Last Year: Never true  Transportation Needs: No Transportation Needs  . Lack of Transportation (Medical): No  . Lack of Transportation (Non-Medical):  No  Physical Activity: Inactive  . Days of Exercise per Week: 0 days  . Minutes of Exercise per Session: 0 min  Stress: No Stress Concern Present  . Feeling of Stress : Only a little  Social Connections: Moderately Isolated  . Frequency of Communication with Friends and Family: More than three times a week  . Frequency of Social Gatherings with Friends and Family: Twice a week  . Attends Religious Services: More than 4 times per year  . Active Member of Clubs or Organizations: No  . Attends Archivist Meetings: Never  . Marital Status: Widowed  Intimate Partner Violence:   . Fear of Current or Ex-Partner: Not on file  . Emotionally Abused: Not on file  . Physically Abused: Not on file  . Sexually Abused: Not on file   Family History  Problem Relation Age of Onset  . Pancreatic cancer Mother   . Heart disease Father   . Heart disease Sister   . Heart attack Sister   . Heart disease Brother   . Diabetes Brother   . Heart disease Brother   . Diabetes Brother   . Hypertension Brother   . Lung cancer Brother       VITAL SIGNS BP 125/78   Pulse (!) 59   Temp 98.2 F (36.8 C)   Resp 20   Ht 5\' 7"  (1.702 m)   Wt 199 lb 6.4 oz (90.4 kg)   SpO2 95%   BMI 31.23 kg/m   Facility-Administered Encounter Medications as of 08/10/2020  Medication  . epoetin alfa (EPOGEN,PROCRIT) injection 40,000 Units   Outpatient Encounter Medications as of 08/10/2020   Medication Sig  . acetaminophen (TYLENOL) 500 MG tablet Take 1 tablet (500 mg total) by mouth every 6 (six) hours as needed (pain).  Marland Kitchen albuterol (PROVENTIL) (2.5 MG/3ML) 0.083% nebulizer solution Take 3 mLs (2.5 mg total) by nebulization in the morning and at bedtime. *May use additional one time if needed for shortness of breath  . albuterol (VENTOLIN HFA) 108 (90 Base) MCG/ACT inhaler Inhale 1 puff into the lungs every 6 (six) hours as needed for wheezing or shortness of breath.  . allopurinol (ZYLOPRIM) 100 MG tablet TAKE 1 TABLET(100 MG) BY MOUTH DAILY  . BREO ELLIPTA 100-25 MCG/INH AEPB 1 TAKE 1 INHALATION BY MOUTH ONCE DAILY  . cholecalciferol (VITAMIN D3) 25 MCG (1000 UNIT) tablet Take 1 tablet (1,000 Units total) by mouth in the morning.  . fluticasone (FLONASE) 50 MCG/ACT nasal spray Place 2 sprays into both nostrils daily.  . folic acid (FOLVITE) 1 MG tablet TAKE 1 TABLET(1 MG) BY MOUTH DAILY  . furosemide (LASIX) 40 MG tablet Take 40 mg by mouth daily. Start 07/13/2020  . gabapentin (NEURONTIN) 300 MG capsule TAKE 1 CAPSULE(300 MG) BY MOUTH TWICE DAILY  . levETIRAcetam (KEPPRA) 500 MG tablet Take 500 mg by mouth 2 (two) times daily. 9 am and 9 pm  . Liniments (SALONPAS PAIN RELIEF PATCH EX) Apply 1 patch topically daily as needed (pain).   . metoprolol tartrate (LOPRESSOR) 50 MG tablet Take 50 mg by mouth 2 (two) times daily. 9 am & 9 pm  . nitroGLYCERIN (NITROSTAT) 0.4 MG SL tablet Place 1 tablet (0.4 mg total) under the tongue every 5 (five) minutes as needed for chest pain.  . NON FORMULARY Diet Change: Soft diet with dys 2 (ground) meats, thin liquids (NAS, cons CHO, heart healthy)  . ondansetron (ZOFRAN ODT) 4 MG disintegrating tablet Take 1 tablet (4  mg total) by mouth every 8 (eight) hours as needed for nausea or vomiting.  . OXYGEN Inhale 3 L into the lungs continuous.  [name] [name] (SYSTANE) 0.4-0.3 % SOLN Apply 1 drop to eye at bedtime. Left eye  .  polyethylene glycol (MIRALAX / GLYCOLAX) 17 g packet Take 17 g by mouth daily as needed.  . potassium chloride SA (KLOR-CON) 20 MEQ tablet Take 20 mEq by mouth daily. 9 am  . pravastatin (PRAVACHOL) 40 MG tablet Take 1 tablet (40 mg total) by mouth daily.  Marland Kitchen senna (SENOKOT) 8.6 MG TABS tablet Take 2 tablets (17.2 mg total) by mouth daily as needed for mild constipation.  Marland Kitchen SYNTHROID 75 MCG tablet Take 1 tablet (75 mcg total) by mouth daily before breakfast.     SIGNIFICANT DIAGNOSTIC EXAMS   PREVIOUS  06-29-20 chest x-ray:  1. Stable cardiomegaly. 2. Unchanged interstitial coarsening, this is similar to multiple prior exams. Septal thickening on prior CT suggests an element of pulmonary edema. There is background emphysema on CT. 3. No new or acute airspace disease  07-03-20: ct of head:  1. No acute intracranial findings. 2. Mildly progressive atrophy and chronic small vessel ischemic changes. 3. Left sphenoid sinus disease.  07-06-20: EEG: This study showed evidence of epileptogenic city arising from left temporal region as well as mild diffuse encephalopathy, nonspecific etiology.  No seizures were seen throughout the recording.   07-08-20: chest x-ray:  Left basilar airspace disease could be due to atelectasis or pneumonia, unchanged. Cardiomegaly without edema. Aortic Atherosclerosis  and Emphysema   NO NEW EXAMS  LABS REVIEWED PREVIOUS  06-29-20: wbc 7.8; hgb 9.9 hct 32.0; mcv 94.4 plt 210; glucose 116; bun 59; creat 1.70; k+ 5.2; na++ 134; ca 9.1 liver normal albumin 3.8  07-01-20; wbc 5.8; hgb 8.8; hct 28.9 mcv 93.8 plt 203; glucose 81; bun 55; creat 1.45; k+ 4.0; na++ 138; ca 9.3 liver normal albumin 3.2 mag 2.0; phos 3.3 07-04-20: wbc 12.3; hgb 9.3; hct 29.1; mcv 90.9 plt 182 07-07-20: wbc 6.1; hgb 8.5; hct 28.0; mcv 95.2 plt 181; glucose 90; bun 63; creat 1.38; k+ 4.0; na++ 138; ca 8.6 mag 2.1; phos 3.8  07-08-20: wbc 7.2; hgb 9.2; hct 30.7; mcv 95.6 plt 190; glucose 121;  bun 65; creat 1.83; k+ 4.2; na++ 137; ca 8.9 BNP 913.0; INR 3.2 07-09-20: INR 3.6 07-13-20: INR 1.8  07-16-20; INR 1.3 on 10 mg daily  07-19-20: INR 1.4  07-22-20: INR 1.8  07-26-20: INR 2.3  07-27-20: INR 2.3 08-02-20: INR 3.3 on 12 mg coumadin    TODAY  08-09-20; INR 4.8   08-10-20: INR 4.5   Review of Systems  Constitutional: Negative for malaise/fatigue.  Respiratory: Negative for cough and shortness of breath.   Cardiovascular: Negative for chest pain, palpitations and leg swelling.  Gastrointestinal: Negative for abdominal pain, constipation and heartburn.  Musculoskeletal: Negative for back pain, joint pain and myalgias.  Skin: Negative.   Neurological: Negative for dizziness.  Psychiatric/Behavioral: The patient is not nervous/anxious.     Physical Exam Constitutional:      General: She is not in acute distress.    Appearance: She is well-developed. She is not diaphoretic.  Neck:     Thyroid: No thyromegaly.  Cardiovascular:     Rate and Rhythm: Normal rate and regular rhythm.     Pulses: Normal pulses.     Heart sounds: Normal heart sounds.     Comments:  History of aortic and mitral  valve replacements Pacemaker ICD placement  Pulmonary:     Effort: Pulmonary effort is normal. No respiratory distress.     Breath sounds: Normal breath sounds.     Comments: 02 Abdominal:     General: Bowel sounds are normal. There is no distension.     Palpations: Abdomen is soft.     Tenderness: There is no abdominal tenderness.  Musculoskeletal:        General: Normal range of motion.     Cervical back: Neck supple.     Right lower leg: No edema.     Left lower leg: No edema.  Lymphadenopathy:     Cervical: No cervical adenopathy.  Skin:    General: Skin is warm and dry.  Neurological:     Mental Status: She is alert and oriented to person, place, and time.  Psychiatric:        Mood and Affect: Mood normal.      ASSESSMENT/ PLAN:  TODAY  1. Persistent atrial  fibrillation 2. S/p MVR (mitral valve replacement) 3. Hx of aortic valve replacement mechanical 4. Chronic anticoagulation on coumadin for mechanical avr and mvr  For INR 4.5 will continue to hold her coumadin and will recheck INR in the AM.   MD is aware of resident's narcotic use and is in agreement with current plan of care. We will attempt to wean resident as appropriate.  Ok Edwards NP [facility]  Contact [phone] Monday through Friday 8am- 5pm  After hours call [phone]

## 2020-08-10 NOTE — Progress Notes (Signed)
PROCEDURE NOTE:  The patient requests injections of the right knee , verbal consent was obtained.  The right knee was prepped appropriately after time out was performed.   Sterile technique was observed and injection of 1 cc of Depo-Medrol 40 mg with several cc's of plain xylocaine. Anesthesia was provided by ethyl chloride and a 20-gauge needle was used to inject the knee area. The injection was tolerated well.  A band aid dressing was applied.  The patient was advised to apply ice later today and tomorrow to the injection sight as needed.  PROCEDURE NOTE:  The patient request injection, verbal consent was obtained.  The left shoulder was prepped appropriately after time out was performed.   Sterile technique was observed and injection of 1 cc of Depo-Medrol 40 mg with several cc's of plain xylocaine. Anesthesia was provided by ethyl chloride and a 20-gauge needle was used to inject the shoulder area. A posterior approach was used.  The injection was tolerated well.  A band aid dressing was applied.  The patient was advised to apply ice later today and tomorrow to the injection sight as needed.  Forms for nursing home completed.  Return prn.  Electronically [employer], MD 11/16/20212:23 PM

## 2020-08-11 ENCOUNTER — Other Ambulatory Visit (HOSPITAL_COMMUNITY)
Admission: RE | Admit: 2020-08-11 | Discharge: 2020-08-11 | Disposition: A | Discharge status: Home / Self Care | Payer: Medicare Other | Source: Skilled Nursing Facility | Attending: Adult Health | Admitting: Adult Health

## 2020-08-11 ENCOUNTER — Encounter: Payer: Self-pay | Admitting: Adult Health

## 2020-08-11 ENCOUNTER — Other Ambulatory Visit: Payer: Self-pay | Admitting: Adult Health

## 2020-08-11 ENCOUNTER — Non-Acute Institutional Stay (SKILLED_NURSING_FACILITY): Payer: Medicare Other | Admitting: Adult Health

## 2020-08-11 ENCOUNTER — Other Ambulatory Visit: Payer: Self-pay | Admitting: *Deleted

## 2020-08-11 DIAGNOSIS — Z7901 Long term (current) use of anticoagulants: Secondary | ICD-10-CM | POA: Diagnosis present

## 2020-08-11 DIAGNOSIS — N184 Chronic kidney disease, stage 4 (severe): Secondary | ICD-10-CM | POA: Diagnosis not present

## 2020-08-11 DIAGNOSIS — I428 Other cardiomyopathies: Secondary | ICD-10-CM

## 2020-08-11 DIAGNOSIS — I5023 Acute on chronic systolic (congestive) heart failure: Secondary | ICD-10-CM

## 2020-08-11 DIAGNOSIS — I130 Hypertensive heart and chronic kidney disease with heart failure and stage 1 through stage 4 chronic kidney disease, or unspecified chronic kidney disease: Secondary | ICD-10-CM

## 2020-08-11 DIAGNOSIS — I5022 Chronic systolic (congestive) heart failure: Secondary | ICD-10-CM | POA: Diagnosis not present

## 2020-08-11 DIAGNOSIS — J441 Chronic obstructive pulmonary disease with (acute) exacerbation: Secondary | ICD-10-CM

## 2020-08-11 LAB — PROTIME-INR
INR: 2.8 — ABNORMAL HIGH (ref 0.8–1.2)
Prothrombin Time: 28.8 seconds — ABNORMAL HIGH (ref 11.4–15.2)

## 2020-08-11 MED ORDER — GABAPENTIN 300 MG PO CAPS: ORAL_CAPSULE | ORAL | 0 refills | Status: DC

## 2020-08-11 MED ORDER — FOLIC ACID 1 MG PO TABS: ORAL_TABLET | ORAL | 0 refills | Status: DC

## 2020-08-11 MED ORDER — WARFARIN SODIUM 10 MG PO TABS: 10.0000 mg | ORAL_TABLET | Freq: Every day | ORAL | 0 refills | Status: DC

## 2020-08-11 MED ORDER — ALLOPURINOL 100 MG PO TABS: ORAL_TABLET | ORAL | 0 refills | Status: DC

## 2020-08-11 MED ORDER — METOPROLOL TARTRATE 50 MG PO TABS: 50.0000 mg | ORAL_TABLET | Freq: Two times a day (BID) | ORAL | 0 refills | Status: DC

## 2020-08-11 MED ORDER — NITROGLYCERIN 0.4 MG SL SUBL: 0.4000 mg | SUBLINGUAL_TABLET | SUBLINGUAL | 0 refills | Status: DC | PRN

## 2020-08-11 MED ORDER — ALBUTEROL SULFATE HFA 108 (90 BASE) MCG/ACT IN AERS: 1.0000 | INHALATION_SPRAY | Freq: Four times a day (QID) | RESPIRATORY_TRACT | 0 refills | Status: DC | PRN

## 2020-08-11 MED ORDER — BREO ELLIPTA 100-25 MCG/INH IN AEPB: 1.0000 | INHALATION_SPRAY | Freq: Every day | RESPIRATORY_TRACT | 0 refills | Status: DC

## 2020-08-11 MED ORDER — POTASSIUM CHLORIDE CRYS ER 20 MEQ PO TBCR: 20.0000 meq | EXTENDED_RELEASE_TABLET | Freq: Every day | ORAL | 0 refills | Status: DC

## 2020-08-11 MED ORDER — ALBUTEROL SULFATE (2.5 MG/3ML) 0.083% IN NEBU: 2.5000 mg | INHALATION_SOLUTION | Freq: Two times a day (BID) | RESPIRATORY_TRACT | 0 refills | Status: DC

## 2020-08-11 MED ORDER — SYNTHROID 75 MCG PO TABS: 75.0000 ug | ORAL_TABLET | Freq: Every day | ORAL | 0 refills | Status: DC

## 2020-08-11 MED ORDER — LEVETIRACETAM 500 MG PO TABS: 500.0000 mg | ORAL_TABLET | Freq: Two times a day (BID) | ORAL | 0 refills | Status: DC

## 2020-08-11 MED ORDER — ONDANSETRON 4 MG PO TBDP: 4.0000 mg | ORAL_TABLET | Freq: Three times a day (TID) | ORAL | 0 refills | Status: DC | PRN

## 2020-08-11 MED ORDER — FUROSEMIDE 40 MG PO TABS: 40.0000 mg | ORAL_TABLET | Freq: Every day | ORAL | 0 refills | Status: DC

## 2020-08-11 MED ORDER — PRAVASTATIN SODIUM 40 MG PO TABS: 40.0000 mg | ORAL_TABLET | Freq: Every day | ORAL | 0 refills | Status: DC

## 2020-08-11 NOTE — Progress Notes (Signed)
Location:    [facility] Room Number: 151/P Place of Service:  SNF (31)    CODE STATUS: DNR  Allergies  Allergen Reactions  . Penicillins Other (See Comments)    Bruise    . Aspirin Other (See Comments)    On coumadin  . Fentanyl Dermatitis    Rash with patch   . Niacin Itching    Chief Complaint  Patient presents with  . Discharge Note    Discharge Visit    HPI:  She is being discharged to home with home health for pt/ot/rn. She will not need any dme; will need her prescriptions written and will need to follow up with her pcp. She had been hospitalized for acute on chronic chf. She was admitted to this facility for therapy to improve upon her level of independence in her adls. She did participate in therapy and has improved upon her level of independence. She is ready to complete her therapy on a home health basis.    Past Medical History:  Diagnosis Date  . Acute on chronic diastolic CHF (congestive heart failure) ([place]) 05/19/2012  . Acute on chronic respiratory failure with hypoxia ([place]) 01/31/2020  . Adenomatous polyp of colon 12/16/2002   Dr. [name]/[facility]  . Allergy   . Calculus of gallbladder with chronic cholecystitis without obstruction 02/09/2009   Qualifier: Diagnosis of  By: [name], [name]    . Cataract   . CHB (complete heart block) ([place]) 10/07/2014      . CHF (congestive heart failure) ([place])    a.  EF previously reduced to 25-30% b. EF improved to 60-65% by echo in 10/2017.   Marland Kitchen Chronic gout due to renal impairment    Per [facility] Electronic Medical Records System   . Chronic kidney disease, stage I 2006  . Cognitive communication deficit    Per [facility] Records [facility]   . Constipation       . Convulsion ([place])    Per [facility] Records [facility]   . COPD (chronic obstructive pulmonary disease)  ([place])       . DJD (degenerative joint disease) of lumbar spine   . DM (diabetes mellitus) ([place]) 2006  . Dysphagia, unspecified    Per [facility] Medical Records [facility]   . Esophageal reflux   . Glaucoma   . Gout       . H/O adenomatous polyp of colon 05/24/2012   2004 in [place] colonoscopy 2008 normal    . H/O heart valve replacement with mechanical valve    a. s/p mechanical AVR in 2001 and mechanical MVR in 2004.  Marland Kitchen H/O wisdom tooth extraction   . HIP PAIN, RIGHT 04/23/2009   Qualifier: Diagnosis of  By: [name], [name]    . Hyperkalemia    Per [facility] Records [facility]   . Hyperlipemia   . Hypertensive heart and kidney disease with HF and with CKD stage I-IV ([place])    Per [facility] Records [facility]   . IDA (iron deficiency anemia)    Parenteral iron/[name]  . Insomnia       . Localized edema    Left arm, Per [facility] Electronic Medical Records [facility]   . Long term (current) use of anticoagulants    Per [facility] Electronic  Medical Records System   . Nausea without vomiting 06/07/2010   Qualifier: Diagnosis of  By: [name], [name]    . Non-ischemic cardiomyopathy ([place])    a. s/p MDT CRTD  . Obesity, unspecified   . Oxygen deficiency 2014   nocturnal;  . Papilloma of breast 03/06/2012   This was diagnosed as a left breast papilloma by ultrasound characteristics and symptoms in 2010. Because of possible medical risk factors no surgical intervention has been done and it did doing annual followups. The area has been stable by physical examination and mammograms and ultrasound since 2010.   . Presence of cardiac pacemaker    Per [facility] Electronic Medical Records System   . Spinal stenosis of lumbar region 02/19/2013  . Spondylolisthesis   . Spondylosis   . Thyroid cancer ([place])    remote  thyroidectomy, no recurrence, pt denies in 2016 thyroid cancer  . Type 2 diabetes mellitus with hyperglycemia ([place]) 07/15/2010  . Type 2 diabetes mellitus with hypoglycemia without coma ([place])    Per [facility] Records [facility]   . Unspecified chronic bronchitis ([place])    Per [facility] Medical Records [facility]   . Unspecified hypothyroidism       . Urinary tract infection, site not specified    Per [facility] Medical Records [facility]   . 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 Vertigo         Past Surgical History:  Procedure Laterality Date  . AORTIC AND MITRAL VALVE REPLACEMENT  2001  . BI-VENTRICULAR IMPLANTABLE CARDIOVERTER DEFIBRILLATOR UPGRADE N/A 01/18/2015   Procedure: BI-VENTRICULAR IMPLANTABLE CARDIOVERTER DEFIBRILLATOR UPGRADE;  Surgeon: Evans Lance, MD;  Location: [facility] CATH LAB;  Service: Cardiovascular;  Laterality: N/A;  . BIV ICD GENERATOR CHANGEOUT N/A 01/30/2019   Procedure: BIV ICD GENERATOR CHANGEOUT;  Surgeon: Evans Lance, MD;  Location: [facility] CV LAB;  Service: Cardiovascular;  Laterality: N/A;  . CARDIAC CATHETERIZATION    . CARDIAC VALVE REPLACEMENT    . CARDIOVERSION N/A 11/13/2016   Procedure: CARDIOVERSION;  Surgeon: Sanda Klein, MD;  Location: [facility] ENDOSCOPY;  Service: Cardiovascular;  Laterality: N/A;  . COLONOSCOPY  06/12/2007   Rourk- Normal rectum/Normal colon  . COLONOSCOPY  12/16/02   small hemorrhoids  . COLONOSCOPY  06/20/2012   Procedure: COLONOSCOPY;  Surgeon: Daneil Dolin, MD;  [facility];  Service: Endoscopy;  Laterality: N/A;  10:45  . defibrillator implanted 2006    . DOPPLER ECHOCARDIOGRAPHY  2012  . DOPPLER ECHOCARDIOGRAPHY  05,06,07,08,09,2011  . ESOPHAGOGASTRODUODENOSCOPY  06/12/2007   Rourk- normal esophagus, small hiatal hernia, otherwise normal stomach, D1, D2  . EYE SURGERY Right 2005  . EYE  SURGERY Left 2006  . ICD LEAD REMOVAL Left 02/08/2015   Procedure: ICD LEAD REMOVAL;  Surgeon: Evans Lance, MD;  Location[facility] OR;  Service: Cardiovascular;  Laterality: Left;  "Will plan extraction and insertion of a BiV PM"  **Dr. Roxan Hockey backing up case**  . IMPLANTABLE CARDIOVERTER DEFIBRILLATOR (ICD) GENERATOR CHANGE Left 02/08/2015   Procedure: ICD GENERATOR CHANGE;  Surgeon: Evans Lance, MD;  Location: [facility];  Service: Cardiovascular;  Laterality: Left;  . INSERT / REPLACE / REMOVE PACEMAKER    . PACEMAKER INSERTION  May 2016  . pacemaker placed  2004  . right breast cyst removed     benign   . right cataract removed  2005  . TEE WITHOUT CARDIOVERSION N/A 11/13/2016   Procedure: TRANSESOPHAGEAL ECHOCARDIOGRAM (TEE);  Surgeon: Sanda Klein, MD;  Location: [facility] ENDOSCOPY;  Service: Cardiovascular;  Laterality: N/A;  . THYROIDECTOMY      Social History   Socioeconomic History  . Marital status: Widowed    Spouse name: Not on file  . Number of children: 0  . Years of education: Not on file  . Highest education level: Not on file  Occupational History  . Occupation: retired    Fish farm manager: RETIRED  Tobacco Use  . Smoking status: Former Smoker    Packs/day: 0.75    Years: 40.00    Pack years: 30.00    Types: Cigarettes    Quit date: 03/08/1987    Years since quitting: 33.4  . Smokeless tobacco: Never Used  Vaping Use  . Vaping Use: Never used  Substance and Sexual Activity  . Alcohol use: No    Alcohol/week: 0.0 standard drinks  . Drug use: No  . Sexual activity: Not Currently  Other Topics Concern  . Not on file  Social History Narrative   From [place] (2004)   Social Determinants of Health   Financial Resource Strain: Low Risk   . Difficulty of Paying Living Expenses: Not hard at all  Food Insecurity: No Food Insecurity  . Worried About Charity fundraiser in the Last Year: Never true  . Ran Out of Food in the Last Year: Never true  Transportation  Needs: No Transportation Needs  . Lack of Transportation (Medical): No  . Lack of Transportation (Non-Medical): No  Physical Activity: Inactive  . Days of Exercise per Week: 0 days  . Minutes of Exercise per Session: 0 min  Stress: No Stress Concern Present  . Feeling of Stress : Only a little  Social Connections: Moderately Isolated  . Frequency of Communication with Friends and Family: More than three times a week  . Frequency of Social Gatherings with Friends and Family: Twice a week  . Attends Religious Services: More than 4 times per year  . Active Member of Clubs or Organizations: No  . Attends Archivist Meetings: Never  . Marital Status: Widowed  Intimate Partner Violence:   . Fear of Current or Ex-Partner: Not on file  . Emotionally Abused: Not on file  . Physically Abused: Not on file  . Sexually Abused: Not on file   Family History  Problem Relation Age of Onset  . Pancreatic cancer Mother   . Heart disease Father   . Heart disease Sister   . Heart attack Sister   . Heart disease Brother   . Diabetes Brother   . Heart disease Brother   . Diabetes Brother   . Hypertension Brother   . Lung cancer Brother     VITAL SIGNS BP 125/78   Pulse (!) 59   Temp (!) 97.3 F (36.3 C)   Resp 20   Ht 5\' 7"  (1.702 m)   Wt 195 lb 6.4 oz (88.6 kg)   SpO2 96%   BMI 30.60 kg/m   Patient's Medications  New Prescriptions   WARFARIN (COUMADIN) 10 MG TABLET    Take 1 tablet (10 mg total) by mouth daily at 4 PM.  Previous Medications   ACETAMINOPHEN (TYLENOL) 500 MG TABLET    Take 1 tablet (500 mg total) by mouth every 6 (six) hours as needed (pain).   CHOLECALCIFEROL (VITAMIN D3) 25 MCG (1000 UNIT) TABLET    Take 1 tablet (  1,000 Units total) by mouth in the morning.   FLUTICASONE (FLONASE) 50 MCG/ACT NASAL SPRAY    Place 2 sprays into both nostrils daily.   LINIMENTS (SALONPAS PAIN RELIEF PATCH EX)    Apply 1 patch topically daily as needed (pain).    NON FORMULARY     Diet Change: Soft diet with dys 2 (ground) meats, thin liquids (NAS, cons CHO, heart healthy)   OXYGEN    Inhale 3 L into the lungs continuous.   POLYETHYL [name] (SYSTANE) 0.4-0.3 % SOLN    Apply 1 drop to eye at bedtime. Left eye   POLYETHYLENE GLYCOL (MIRALAX / GLYCOLAX) 17 G PACKET    Take 17 g by mouth daily as needed.   SENNA (SENOKOT) 8.6 MG TABS TABLET    Take 2 tablets (17.2 mg total) by mouth daily as needed for mild constipation.  Modified Medications   Modified Medication Previous Medication   ALBUTEROL (PROVENTIL) (2.5 MG/3ML) 0.083% NEBULIZER SOLUTION albuterol (PROVENTIL) (2.5 MG/3ML) 0.083% nebulizer solution      Take 3 mLs (2.5 mg total) by nebulization in the morning and at bedtime. *May use additional one time if needed for shortness of breath    Take 3 mLs (2.5 mg total) by nebulization in the morning and at bedtime. *May use additional one time if needed for shortness of breath   ALBUTEROL (VENTOLIN HFA) 108 (90 BASE) MCG/ACT INHALER albuterol (VENTOLIN HFA) 108 (90 Base) MCG/ACT inhaler      Inhale 1 puff into the lungs every 6 (six) hours as needed for wheezing or shortness of breath.    Inhale 1 puff into the lungs every 6 (six) hours as needed for wheezing or shortness of breath.   ALLOPURINOL (ZYLOPRIM) 100 MG TABLET allopurinol (ZYLOPRIM) 100 MG tablet      TAKE 1 TABLET(100 MG) BY MOUTH DAILY    TAKE 1 TABLET(100 MG) BY MOUTH DAILY   BREO ELLIPTA 100-25 MCG/INH AEPB BREO ELLIPTA 100-25 MCG/INH AEPB      Inhale 1 puff into the lungs daily. 1 TAKE 1 INHALATION BY MOUTH ONCE DAILY    1 TAKE 1 INHALATION BY MOUTH ONCE DAILY   FOLIC ACID (FOLVITE) 1 MG TABLET folic acid (FOLVITE) 1 MG tablet      TAKE 1 TABLET(1 MG) BY MOUTH DAILY    TAKE 1 TABLET(1 MG) BY MOUTH DAILY   FUROSEMIDE (LASIX) 40 MG TABLET furosemide (LASIX) 40 MG tablet      Take 1 tablet (40 mg total) by mouth daily. Start 07/13/2020    Take 40 mg by mouth daily. Start 07/13/2020   GABAPENTIN  (NEURONTIN) 300 MG CAPSULE gabapentin (NEURONTIN) 300 MG capsule      TAKE 1 CAPSULE(300 MG) BY MOUTH TWICE DAILY    TAKE 1 CAPSULE(300 MG) BY MOUTH TWICE DAILY   LEVETIRACETAM (KEPPRA) 500 MG TABLET levETIRAcetam (KEPPRA) 500 MG tablet      Take 1 tablet (500 mg total) by mouth 2 (two) times daily. 9 am and 9 pm    Take 500 mg by mouth 2 (two) times daily. 9 am and 9 pm   METOPROLOL TARTRATE (LOPRESSOR) 50 MG TABLET metoprolol tartrate (LOPRESSOR) 50 MG tablet      Take 1 tablet (50 mg total) by mouth 2 (two) times daily. 9 am & 9 pm    Take 50 mg by mouth 2 (two) times daily. 9 am & 9 pm   NITROGLYCERIN (NITROSTAT) 0.4 MG SL TABLET nitroGLYCERIN (NITROSTAT) 0.4 MG  SL tablet      Place 1 tablet (0.4 mg total) under the tongue every 5 (five) minutes as needed for chest pain.    Place 1 tablet (0.4 mg total) under the tongue every 5 (five) minutes as needed for chest pain.   ONDANSETRON (ZOFRAN ODT) 4 MG DISINTEGRATING TABLET ondansetron (ZOFRAN ODT) 4 MG disintegrating tablet      Take 1 tablet (4 mg total) by mouth every 8 (eight) hours as needed for nausea or vomiting.    Take 1 tablet (4 mg total) by mouth every 8 (eight) hours as needed for nausea or vomiting.   POTASSIUM CHLORIDE SA (KLOR-CON) 20 MEQ TABLET potassium chloride SA (KLOR-CON) 20 MEQ tablet      Take 1 tablet (20 mEq total) by mouth daily. 9 am    Take 20 mEq by mouth daily. 9 am   PRAVASTATIN (PRAVACHOL) 40 MG TABLET pravastatin (PRAVACHOL) 40 MG tablet      Take 1 tablet (40 mg total) by mouth daily.    Take 1 tablet (40 mg total) by mouth daily.   SYNTHROID 75 MCG TABLET SYNTHROID 75 MCG tablet      Take 1 tablet (75 mcg total) by mouth daily before breakfast.    Take 1 tablet (75 mcg total) by mouth daily before breakfast.  Discontinued Medications   No medications on file     SIGNIFICANT DIAGNOSTIC EXAMS   PREVIOUS  06-29-20 chest x-ray:  1. Stable cardiomegaly. 2. Unchanged interstitial coarsening, this is similar  to multiple prior exams. Septal thickening on prior CT suggests an element of pulmonary edema. There is background emphysema on CT. 3. No new or acute airspace disease  07-03-20: ct of head:  1. No acute intracranial findings. 2. Mildly progressive atrophy and chronic small vessel ischemic changes. 3. Left sphenoid sinus disease.  07-06-20: EEG: This study showed evidence of epileptogenic city arising from left temporal region as well as mild diffuse encephalopathy, nonspecific etiology.  No seizures were seen throughout the recording.   07-08-20: chest x-ray:  Left basilar airspace disease could be due to atelectasis or pneumonia, unchanged. Cardiomegaly without edema. Aortic Atherosclerosis  and Emphysema   NO NEW EXAMS  LABS REVIEWED PREVIOUS  06-29-20: wbc 7.8; hgb 9.9 hct 32.0; mcv 94.4 plt 210; glucose 116; bun 59; creat 1.70; k+ 5.2; na++ 134; ca 9.1 liver normal albumin 3.8  07-01-20; wbc 5.8; hgb 8.8; hct 28.9 mcv 93.8 plt 203; glucose 81; bun 55; creat 1.45; k+ 4.0; na++ 138; ca 9.3 liver normal albumin 3.2 mag 2.0; phos 3.3 07-04-20: wbc 12.3; hgb 9.3; hct 29.1; mcv 90.9 plt 182 07-07-20: wbc 6.1; hgb 8.5; hct 28.0; mcv 95.2 plt 181; glucose 90; bun 63; creat 1.38; k+ 4.0; na++ 138; ca 8.6 mag 2.1; phos 3.8  07-08-20: wbc 7.2; hgb 9.2; hct 30.7; mcv 95.6 plt 190; glucose 121; bun 65; creat 1.83; k+ 4.2; na++ 137; ca 8.9 BNP 913.0; INR 3.2 07-09-20: INR 3.6 07-13-20: INR 1.8  07-16-20; INR 1.3 on 10 mg daily  07-19-20: INR 1.4  07-22-20: INR 1.8  07-26-20: INR 2.3  07-27-20: INR 2.3 08-02-20: INR 3.3 on 12 mg coumadin    TODAY  08-09-20; INR 4.8   08-10-20: INR 4.5 08-11-20: INR 2.8   Review of Systems  Constitutional: Negative for malaise/fatigue.  Respiratory: Negative for cough and shortness of breath.   Cardiovascular: Negative for chest pain, palpitations and leg swelling.  Gastrointestinal: Negative for abdominal pain, constipation and heartburn.  Musculoskeletal: Negative for back pain, joint pain and myalgias.  Skin: Negative.   Neurological: Negative for dizziness.  Psychiatric/Behavioral: The patient is not nervous/anxious.    Physical Exam Constitutional:      General: She is not in acute distress.    Appearance: She is well-developed. She is obese. She is not diaphoretic.  Neck:     Thyroid: No thyromegaly.  Cardiovascular:     Rate and Rhythm: Normal rate and regular rhythm.     Pulses: Normal pulses.     Heart sounds: Normal heart sounds.     Comments: History of aortic and mitral valve replacements Pacemaker ICD placement  Pulmonary:     Effort: Pulmonary effort is normal. No respiratory distress.     Breath sounds: Normal breath sounds.     Comments: 02 Abdominal:     General: Bowel sounds are normal. There is no distension.     Palpations: Abdomen is soft.     Tenderness: There is no abdominal tenderness.  Musculoskeletal:        General: Normal range of motion.     Cervical back: Neck supple.     Right lower leg: No edema.     Left lower leg: No edema.  Lymphadenopathy:     Cervical: No cervical adenopathy.  Skin:    General: Skin is warm and dry.  Neurological:     Mental Status: She is alert and oriented to person, place, and time.  Psychiatric:        Mood and Affect: Mood normal.       ASSESSMENT/ PLAN:   Patient is being discharged with the following home health services:  Pt/ot/rn: to evaluate and treat as indicated for gait balance strength adl training medication management   Patient is being discharged with the following durable medical equipment:  None needed   Patient has been advised to f/u with their PCP in 1-2 weeks to bring them up to date on their rehab stay.  Social services at facility was responsible for arranging this appointment.  Pt was provided with a 30 day supply of prescriptions for medications and refills must be obtained from their PCP.  For controlled substances, a  more limited supply may be provided adequate until PCP appointment only.  A 30 day supply of her prescription medications have been sent to: [facility]  Time spent with patient: 35 minutes: dme; home health medications.    Ok Edwards NP [facility] Adult Medicine  Contact [phone] Monday through Friday 8am- 5pm  After hours call [phone]

## 2020-08-11 NOTE — Patient Outreach (Addendum)
[place] Coordinator follow up. Member screened for potential [facility] Care Management needs as a benefit of [place] Medicare.  Update received from [facility] SNF SW indicating member will transition to home on Thursday 08/12/20. Member will have aide assistance for up to 6 hrs a day. Mrs. Garron will have family alternate staying nights with her. Lives with her nephew that works 12 hr shifts. Reports primary contact is niece Serafina Mitchell [phone] while member has been in SNF.  Telephone call made to Mrs. Buxbaum [phone]. No answer. Unable to leave voicemail message. Voicemail box full.   Telephone call made to niece [facility] [phone]. Patient identifiers confirmed. [place]e goes on to say that Mrs. Lipsey is adamant to return home even though she could benefit from staying long term. Dollaree states Mrs. Traynor "makes her own decisions and no one can make her do anything she doesn't want to do."   Dollaree advises writer to contact Mrs. Ferrufino on Friday to discuss [facility] Management services. States she is not sure if Mrs. Najera will want to talk on the phone for ongoing outreaches however. States calling member during 9-1 pm will be best when the aide is present.  Writer will plan outreach on Friday 11/19 to discuss [facility] Management services.    Marthenia Rolling, MSN, RN,BSN [place] Acute Care Coordinator [phone] [facility]) [phone]  (Toll free office)

## 2020-08-13 ENCOUNTER — Other Ambulatory Visit: Payer: Self-pay | Admitting: *Deleted

## 2020-08-13 ENCOUNTER — Ambulatory Visit (INDEPENDENT_AMBULATORY_CARE_PROVIDER_SITE_OTHER): Payer: Medicare Other

## 2020-08-13 DIAGNOSIS — I442 Atrioventricular block, complete: Secondary | ICD-10-CM | POA: Diagnosis not present

## 2020-08-13 LAB — CUP PACEART REMOTE DEVICE CHECK
Battery Remaining Longevity: 49 mo
Battery Voltage: 2.96 V
Brady Statistic AP VP Percent: 0 %
Brady Statistic AP VS Percent: 0 %
Brady Statistic AS VP Percent: 99.66 %
Brady Statistic AS VS Percent: 0.34 %
Brady Statistic RA Percent Paced: 0 %
Brady Statistic RV Percent Paced: 99.66 %
Date Time Interrogation Session: 20211119093339
Implantable Lead Implant Date: 20040521
Implantable Lead Implant Date: 20071218
Implantable Lead Implant Date: 20160516
Implantable Lead Location: 753858
Implantable Lead Location: 753859
Implantable Lead Location: 753860
Implantable Lead Model: 5076
Implantable Lead Model: 6947
Implantable Pulse Generator Implant Date: 20200506
Lead Channel Impedance Value: 285 Ohm
Lead Channel Impedance Value: 285 Ohm
Lead Channel Impedance Value: 304 Ohm
Lead Channel Impedance Value: 323 Ohm
Lead Channel Impedance Value: 342 Ohm
Lead Channel Impedance Value: 361 Ohm
Lead Channel Impedance Value: 399 Ohm
Lead Channel Impedance Value: 399 Ohm
Lead Channel Impedance Value: 589 Ohm
Lead Channel Impedance Value: 608 Ohm
Lead Channel Impedance Value: 665 Ohm
Lead Channel Impedance Value: 665 Ohm
Lead Channel Impedance Value: 684 Ohm
Lead Channel Impedance Value: 703 Ohm
Lead Channel Pacing Threshold Amplitude: 1.25 V
Lead Channel Pacing Threshold Amplitude: 2.75 V
Lead Channel Pacing Threshold Pulse Width: 0.4 ms
Lead Channel Pacing Threshold Pulse Width: 1 ms
Lead Channel Sensing Intrinsic Amplitude: 8.875 mV
Lead Channel Sensing Intrinsic Amplitude: 8.875 mV
Lead Channel Setting Pacing Amplitude: 2.5 V
Lead Channel Setting Pacing Amplitude: 3 V
Lead Channel Setting Pacing Pulse Width: 0.8 ms
Lead Channel Setting Pacing Pulse Width: 1 ms
Lead Channel Setting Sensing Sensitivity: 4 mV

## 2020-08-13 NOTE — Patient Outreach (Signed)
[facility] Post- Acute Care Coordinator follow up. Member screened for potential [facility] Care Management needs as a benefit of [place] Medicare.  Mrs. Coppock transitioned from [place] on 08/12/20.   Telephone call made to member [phone] to discuss [place] Management services. Patient identifiers confirmed. Mrs. Kochel endorses that she has caregiver assistance for 6 hrs a day. States her nephew works night shift. States she is in the process of arranging someone to stay with her at night while her nephew works.   States PCP appointment is scheduled next Wednesday. Will have [place].   Discussed [facility] Care Management services. Explained [facility] Care Management will not interfere or replace home health services. Mrs. Alessandrini confirms that she remembers having [facility] in the past. Mrs. Berntsen endorses she will re-engage again. Mrs. Carthen also reports she has medical alert system.   Mrs. Rafuse has a medical history of CHF, HTN, stage III CKD, CHB s/p pacemaker, AVR/MVR, COPD, DM.  Will place [facility] for complex case management and care coordination.   Marthenia Rolling, MSN, RN,BSN [place] Acute Care Coordinator [phone] [facility]) [phone]  (Toll free office)

## 2020-08-14 DIAGNOSIS — K5900 Constipation, unspecified: Secondary | ICD-10-CM | POA: Diagnosis not present

## 2020-08-14 DIAGNOSIS — J9621 Acute and chronic respiratory failure with hypoxia: Secondary | ICD-10-CM | POA: Diagnosis not present

## 2020-08-14 DIAGNOSIS — H409 Unspecified glaucoma: Secondary | ICD-10-CM | POA: Diagnosis not present

## 2020-08-14 DIAGNOSIS — I442 Atrioventricular block, complete: Secondary | ICD-10-CM | POA: Diagnosis not present

## 2020-08-14 DIAGNOSIS — R1310 Dysphagia, unspecified: Secondary | ICD-10-CM | POA: Diagnosis not present

## 2020-08-14 DIAGNOSIS — N184 Chronic kidney disease, stage 4 (severe): Secondary | ICD-10-CM | POA: Diagnosis not present

## 2020-08-14 DIAGNOSIS — D509 Iron deficiency anemia, unspecified: Secondary | ICD-10-CM | POA: Diagnosis not present

## 2020-08-14 DIAGNOSIS — E890 Postprocedural hypothyroidism: Secondary | ICD-10-CM | POA: Diagnosis not present

## 2020-08-14 DIAGNOSIS — G4700 Insomnia, unspecified: Secondary | ICD-10-CM | POA: Diagnosis not present

## 2020-08-14 DIAGNOSIS — I950 Idiopathic hypotension: Secondary | ICD-10-CM | POA: Diagnosis not present

## 2020-08-14 DIAGNOSIS — M48061 Spinal stenosis, lumbar region without neurogenic claudication: Secondary | ICD-10-CM | POA: Diagnosis not present

## 2020-08-14 DIAGNOSIS — N179 Acute kidney failure, unspecified: Secondary | ICD-10-CM | POA: Diagnosis not present

## 2020-08-14 DIAGNOSIS — E1122 Type 2 diabetes mellitus with diabetic chronic kidney disease: Secondary | ICD-10-CM | POA: Diagnosis not present

## 2020-08-14 DIAGNOSIS — I429 Cardiomyopathy, unspecified: Secondary | ICD-10-CM | POA: Diagnosis not present

## 2020-08-14 DIAGNOSIS — M47816 Spondylosis without myelopathy or radiculopathy, lumbar region: Secondary | ICD-10-CM | POA: Diagnosis not present

## 2020-08-14 DIAGNOSIS — I8393 Asymptomatic varicose veins of bilateral lower extremities: Secondary | ICD-10-CM | POA: Diagnosis not present

## 2020-08-14 DIAGNOSIS — E669 Obesity, unspecified: Secondary | ICD-10-CM | POA: Diagnosis not present

## 2020-08-14 DIAGNOSIS — R569 Unspecified convulsions: Secondary | ICD-10-CM | POA: Diagnosis not present

## 2020-08-14 DIAGNOSIS — E11649 Type 2 diabetes mellitus with hypoglycemia without coma: Secondary | ICD-10-CM | POA: Diagnosis not present

## 2020-08-14 DIAGNOSIS — I5043 Acute on chronic combined systolic (congestive) and diastolic (congestive) heart failure: Secondary | ICD-10-CM | POA: Diagnosis not present

## 2020-08-14 DIAGNOSIS — J449 Chronic obstructive pulmonary disease, unspecified: Secondary | ICD-10-CM | POA: Diagnosis not present

## 2020-08-14 DIAGNOSIS — I4819 Other persistent atrial fibrillation: Secondary | ICD-10-CM | POA: Diagnosis not present

## 2020-08-14 DIAGNOSIS — K219 Gastro-esophageal reflux disease without esophagitis: Secondary | ICD-10-CM | POA: Diagnosis not present

## 2020-08-14 DIAGNOSIS — I130 Hypertensive heart and chronic kidney disease with heart failure and stage 1 through stage 4 chronic kidney disease, or unspecified chronic kidney disease: Secondary | ICD-10-CM | POA: Diagnosis not present

## 2020-08-14 DIAGNOSIS — M4310 Spondylolisthesis, site unspecified: Secondary | ICD-10-CM | POA: Diagnosis not present

## 2020-08-16 ENCOUNTER — Other Ambulatory Visit: Payer: Self-pay | Admitting: *Deleted

## 2020-08-16 ENCOUNTER — Encounter: Payer: Self-pay | Admitting: *Deleted

## 2020-08-16 ENCOUNTER — Telehealth: Payer: Self-pay

## 2020-08-16 NOTE — Telephone Encounter (Signed)
Transition Care Management Unsuccessful Follow-up Telephone Call  Date of discharge and from where:  08/13/20 [facility]  Attempts:  2nd Attempt  Reason for unsuccessful TCM follow-up call:  Left voice message

## 2020-08-16 NOTE — Telephone Encounter (Signed)
Transition Care Management Unsuccessful Follow-up Telephone Call  Date of discharge and from where:  08/13/20 [facility]  Attempts:  1st Attempt  Reason for unsuccessful TCM follow-up call:  Unable to reach patient

## 2020-08-16 NOTE — Patient Outreach (Signed)
[facility]) Care Management [facility] CM Telephone Outreach, new referral/ Transition of Care day # 1 Post- SNF discharge day # 4  08/16/2020  [name] [name] 01-16-1932 [id number]  Successful initial telephone outreach attempt to [name] [name], 86 y/o female referred to [place] 08/13/20 by [facility] RN PAC after patient had recent hospitalization October 5- 14, 2021 for SOB/ pulmonary edema/ acute-on-chronic CHF exacerbation; during her hospitalization, patient had seizure event which complicated her hospitalization; she was discharged from hospital to SNF for rehabilitation and was subsequently discharged from SNF to home/ self-care with home health services in place on August 12, 2020.  HIPAA/ identity verified and purpose of call/ [facility] CM services and program discussed with patient who provides verbal consent for [facility] CM involvement in her care post- recent SNF discharge.  Patient reports "doing okay" and states that her CNA is at her home assisting her; reports CNA "[name]" assists her 5 days per week and assist with transportation; reports lives with her adult nephew who works night shift; states other family members are rotating staying with her at night; reports good/ regular family support and assistance as indicated as a baseline.  She denies clinical concerns and sounds to be in no distress throughout call today.  -- monitoring and recording daily weights at home: weight gain guidelines/ action plan in setting of CHF discussed with patient, who admits she requires reinforcement of same: reports daily weights at home between 188-190 as baseline; denies issues/ concerns around breathing status; reports ongoing continuous use of home O2 at 2-3 L/min via [place] -- denies concerns/ issues around medications; reports she obtains blister packs through her outpatient pharmacy and has used this services for a long time; declines medication review stating that she trusts her pharmacy and "just  takes" what they prepare for her in the blister packaging -- confirms home health active and that home health team have visited her post- SNF discharge -- follows "healthy" diet; family and aide prepare meals for patient -- SDOH completed/ updated: family/ aide provide transportation to provider appointments -- would like information about creating Advanced Directives- will place in mail to patient  Patient denies further issues, concerns, or problems today.  I provided/ confirmed patient with my direct phone number, the main [place] CM office phone number, and the [facility] CM 24-hour nurse advice phone number should issues arise prior to next scheduled [facility] CM outreach for ongoing transition of care next week.  Encouraged patient to contact me directly if needs, questions, issues, or concerns arise prior to next scheduled outreach; patient agreed to do so.  Plan:  Patient will take medications as prescribed and will attend all scheduled provider appointments  Patient will promptly notify care providers for any new concerns/ issues/ problems that arise  Patient will actively participate in home health services as ordered post-hospital discharge  Patient will continue monitoring/ recording daily weights   I will make patient's PCP aware of [facility] RN CM involvement in patient's care-- will send barriers letter  [facility] CM outreach for transition of care to continue with scheduled phone call next week  [name], RN, BSN, [place] Care Management  [phone]

## 2020-08-16 NOTE — Progress Notes (Signed)
Remote pacemaker transmission.   

## 2020-08-17 ENCOUNTER — Inpatient Hospital Stay: Payer: Medicare Other | Admitting: Internal Medicine

## 2020-08-17 DIAGNOSIS — E1122 Type 2 diabetes mellitus with diabetic chronic kidney disease: Secondary | ICD-10-CM | POA: Diagnosis not present

## 2020-08-17 DIAGNOSIS — I5043 Acute on chronic combined systolic (congestive) and diastolic (congestive) heart failure: Secondary | ICD-10-CM | POA: Diagnosis not present

## 2020-08-17 DIAGNOSIS — I4819 Other persistent atrial fibrillation: Secondary | ICD-10-CM | POA: Diagnosis not present

## 2020-08-17 DIAGNOSIS — N184 Chronic kidney disease, stage 4 (severe): Secondary | ICD-10-CM | POA: Diagnosis not present

## 2020-08-17 DIAGNOSIS — N179 Acute kidney failure, unspecified: Secondary | ICD-10-CM | POA: Diagnosis not present

## 2020-08-17 DIAGNOSIS — I130 Hypertensive heart and chronic kidney disease with heart failure and stage 1 through stage 4 chronic kidney disease, or unspecified chronic kidney disease: Secondary | ICD-10-CM | POA: Diagnosis not present

## 2020-08-17 NOTE — Patient Instructions (Signed)
Goals Addressed            This Visit's Progress   . [facility] CM: Learn More About My Health       Follow Up Date 08/25/20   - tell my story and reason for my visit - make a list of questions - ask questions - repeat what I heard to make sure I understand - bring a list of my medicines to the visit - speak up when I don't understand    Why is this important?   The best way to learn about your health and care is by talking to the doctor and nurse.  They will answer your questions and give you information in the way that you like best.    Notes:     . [facility] CM: Maintain My Quality of Life       Follow Up Date 08/25/20   - complete a living will - discuss my treatment options with the doctor or nurse - make shared treatment decisions with doctor - name a health care proxy (decision maker)    Why is this important?   Having a long-term illness can be scary.  It can also be stressful for you and your caregiver.  These steps may help.    Notes:   08/16/20:  -- will send patient printed information around creating Advanced Directives for Living Will/ HCPOA    . [facility] CM: Make and Keep All Appointments       Follow Up Date 08/25/20   - arrange a ride through an agency 1 week before appointment - ask family or friend for a ride - keep a calendar with appointment dates    Why is this important?   Part of staying healthy is seeing the doctor for follow-up care.  If you forget your appointments, there are some things you can do to stay on track.    Notes:     . [facility] CM: Protect My Health       Follow Up Date 08/25/20   - schedule appointment for vaccines needed due to my age or health - schedule recommended health tests (blood work, mammogram, colonoscopy, pap test) - schedule and keep appointment for annual check-up    Why is this important?   Screening tests can find diseases early when they are easier to treat.  Your doctor or nurse will talk with you about which tests are  important for you.  Getting shots for common diseases like the flu and shingles will help prevent them.     Notes:     . [facility] CM: Track and Manage Symptoms       Follow Up Date 08/25/20   - develop a rescue plan - eat more whole grains, fruits and vegetables, lean meats and healthy fats - follow rescue plan if symptoms flare-up - know when to call the doctor - track symptoms and what helps feel better or worse - dress right for the weather, hot or cold    Why is this important?   You will be able to handle your symptoms better if you keep track of them.  Making some simple changes to your lifestyle will help.  Eating healthy is one thing you can do to take good care of yourself.    Notes:     . Track and Manage Fluids and Swelling       Follow Up Date 08/25/20   - call office if I gain more than 2 pounds  in one day or 5 pounds in one week - do ankle pumps when sitting - keep legs up while sitting - track weight in diary - use salt in moderation - watch for swelling in feet, ankles and legs every day - weigh myself daily    Why is this important?   It is important to check your weight daily and watch how much salt and liquids you have.  It will help you to manage your heart failure.    Notes:   08/16/20: -- patient reports daily weight monitoring and recording: reports post- SNF discharge weights 188-190 lbs -- discussed patient's baseline use of home Oc: 2-3 L/min via [place]; patient using continuously       Advance Directive  Advance directives are legal documents that let you make choices ahead of time about your health care and medical treatment in case you become unable to communicate for yourself. Advance directives are a way for you to make known your wishes to family, friends, and health care providers. This can let others know about your end-of-life care if you become unable to communicate. Discussing and writing advance directives should happen over time rather  than all at once. Advance directives can be changed depending on your situation and what you want, even after you have signed the advance directives. There are different types of advance directives, such as:  Medical power of attorney.  Living will.  Do not resuscitate (DNR) or do not attempt resuscitation (DNAR) order. Health care proxy and medical power of attorney A health care proxy is also called a health care agent. This is a person who is appointed to make medical decisions for you in cases where you are unable to make the decisions yourself. Generally, people choose someone they know well and trust to represent their preferences. Make sure to ask this person for an agreement to act as your proxy. A proxy may have to exercise judgment in the event of a medical decision for which your wishes are not known. A medical power of attorney is a legal document that names your health care proxy. Depending on the laws in your state, after the document is written, it may also need to be:  Signed.  Notarized.  Dated.  Copied.  Witnessed.  Incorporated into your medical record. You may also want to appoint someone to manage your money in a situation in which you are unable to do so. This is called a durable power of attorney for finances. It is a separate legal document from the durable power of attorney for health care. You may choose the same person or someone different from your health care proxy to act as your agent in money matters. If you do not appoint a proxy, or if there is a concern that the proxy is not acting in your best interests, a court may appoint a guardian to act on your behalf. Living will A living will is a set of instructions that state your wishes about medical care when you cannot express them yourself. Health care providers should keep a copy of your living will in your medical record. You may want to give a copy to family members or friends. To alert caregivers in case  of an emergency, you can place a card in your wallet to let them know that you have a living will and where they can find it. A living will is used if you become:  Terminally ill.  Disabled.  Unable to communicate or  make decisions. Items to consider in your living will include:  To use or not to use life-support equipment, such as dialysis machines and breathing machines (ventilators).  A DNR or DNAR order. This tells health care providers not to use cardiopulmonary resuscitation (CPR) if breathing or heartbeat stops.  To use or not to use tube feeding.  To be given or not to be given food and fluids.  Comfort (palliative) care when the goal becomes comfort rather than a cure.  Donation of organs and tissues. A living will does not give instructions for distributing your money and property if you should pass away. DNR or DNAR A DNR or DNAR order is a request not to have CPR in the event that your heart stops beating or you stop breathing. If a DNR or DNAR order has not been made and shared, a health care provider will try to help any patient whose heart has stopped or who has stopped breathing. If you plan to have surgery, talk with your health care provider about how your DNR or DNAR order will be followed if problems occur. What if I do not have an advance directive? If you do not have an advance directive, some states assign family decision makers to act on your behalf based on how closely you are related to them. Each state has its own laws about advance directives. You may want to check with your health care provider, attorney, or state representative about the laws in your state. Summary  Advance directives are the legal documents that allow you to make choices ahead of time about your health care and medical treatment in case you become unable to tell others about your care.  The process of discussing and writing advance directives should happen over time. You can change the  advance directives, even after you have signed them.  Advance directives include DNR or DNAR orders, living wills, and designating an agent as your medical power of attorney. This information is not intended to replace advice given to you by your health care provider. Make sure you discuss any questions you have with your health care provider. Document Revised: 04/10/2019 Document Reviewed: 04/10/2019 Elsevier Patient Education  [place].    Heart Failure Action Plan A heart failure action plan helps you understand what to do when you have symptoms of heart failure. Follow the plan that was created by you and your health care provider. Review your plan each time you visit your health care provider. Red zone These signs and symptoms mean you should get medical help right away:  You have trouble breathing when resting.  You have a dry cough that is getting worse.  You have swelling or pain in your legs or abdomen that is getting worse.  You suddenly gain more than 2-3 lb (0.9-1.4 kg) in a day, or more than 5 lb (2.3 kg) in one week. This amount may be more or less depending on your condition.  You have trouble staying awake or you feel confused.  You have chest pain.  You do not have an appetite.  You pass out. If you experience any of these symptoms:  Call your local emergency services (911 in the [place] right away or seek help at the emergency department of the nearest hospital. Yellow zone These signs and symptoms mean your condition may be getting worse and you should make some changes:  You have trouble breathing when you are active or you need to sleep with extra pillows.  You have swelling in your legs or abdomen.  You gain 2-3 lb (0.9-1.4 kg) in one day, or 5 lb (2.3 kg) in one week. This amount may be more or less depending on your condition.  You get tired easily.  You have trouble sleeping.  You have a dry cough. If you experience any of these  symptoms:  Contact your health care provider within the next day.  Your health care provider may adjust your medicines. Green zone These signs mean you are doing well and can continue what you are doing:  You do not have shortness of breath.  You have very little swelling or no new swelling.  Your weight is stable (no gain or loss).  You have a normal activity level.  You do not have chest pain or any other new symptoms. Follow these instructions at home:  Take over-the-counter and prescription medicines only as told by your health care provider.  Weigh yourself daily. Your target weight is __________ lb (__________ kg). ? Call your health care provider if you gain more than __________ lb (__________ kg) in a day, or more than __________ lb (__________ kg) in one week.  Eat a heart-healthy diet. Work with a diet and nutrition specialist (dietitian) to create an eating plan that is best for you.  Keep all follow-up visits as told by your health care provider. This is important. Where to find more information  American Heart Association: www.heart.org Summary  Follow the action plan that was created by you and your health care provider.  Get help right away if you have any symptoms in the Red zone. This information is not intended to replace advice given to you by your health care provider. Make sure you discuss any questions you have with your health care provider. Document Revised: 08/24/2017 Document Reviewed: 10/21/2016 Elsevier Patient Education  2020 [employer].

## 2020-08-18 ENCOUNTER — Inpatient Hospital Stay: Payer: Medicare Other | Admitting: Internal Medicine

## 2020-08-21 DIAGNOSIS — I130 Hypertensive heart and chronic kidney disease with heart failure and stage 1 through stage 4 chronic kidney disease, or unspecified chronic kidney disease: Secondary | ICD-10-CM | POA: Diagnosis not present

## 2020-08-21 DIAGNOSIS — N179 Acute kidney failure, unspecified: Secondary | ICD-10-CM | POA: Diagnosis not present

## 2020-08-21 DIAGNOSIS — I4819 Other persistent atrial fibrillation: Secondary | ICD-10-CM | POA: Diagnosis not present

## 2020-08-21 DIAGNOSIS — E1122 Type 2 diabetes mellitus with diabetic chronic kidney disease: Secondary | ICD-10-CM | POA: Diagnosis not present

## 2020-08-21 DIAGNOSIS — N184 Chronic kidney disease, stage 4 (severe): Secondary | ICD-10-CM | POA: Diagnosis not present

## 2020-08-21 DIAGNOSIS — I5043 Acute on chronic combined systolic (congestive) and diastolic (congestive) heart failure: Secondary | ICD-10-CM | POA: Diagnosis not present

## 2020-08-21 LAB — POCT INR: INR: 2.7 (ref 2.0–3.0)

## 2020-08-23 ENCOUNTER — Ambulatory Visit (INDEPENDENT_AMBULATORY_CARE_PROVIDER_SITE_OTHER): Payer: Medicare Other | Admitting: *Deleted

## 2020-08-23 ENCOUNTER — Telehealth: Payer: Self-pay

## 2020-08-23 DIAGNOSIS — Z5181 Encounter for therapeutic drug level monitoring: Secondary | ICD-10-CM | POA: Diagnosis not present

## 2020-08-23 DIAGNOSIS — I5043 Acute on chronic combined systolic (congestive) and diastolic (congestive) heart failure: Secondary | ICD-10-CM | POA: Diagnosis not present

## 2020-08-23 DIAGNOSIS — I4819 Other persistent atrial fibrillation: Secondary | ICD-10-CM | POA: Diagnosis not present

## 2020-08-23 DIAGNOSIS — N184 Chronic kidney disease, stage 4 (severe): Secondary | ICD-10-CM | POA: Diagnosis not present

## 2020-08-23 DIAGNOSIS — I130 Hypertensive heart and chronic kidney disease with heart failure and stage 1 through stage 4 chronic kidney disease, or unspecified chronic kidney disease: Secondary | ICD-10-CM | POA: Diagnosis not present

## 2020-08-23 DIAGNOSIS — E1122 Type 2 diabetes mellitus with diabetic chronic kidney disease: Secondary | ICD-10-CM | POA: Diagnosis not present

## 2020-08-23 DIAGNOSIS — Z952 Presence of prosthetic heart valve: Secondary | ICD-10-CM

## 2020-08-23 DIAGNOSIS — N179 Acute kidney failure, unspecified: Secondary | ICD-10-CM | POA: Diagnosis not present

## 2020-08-23 NOTE — Patient Instructions (Signed)
Spoke with [facility].  Order given to continue warfarin 2 tablets daily  Continue greens Recheck INR on Thursday 08/26/20

## 2020-08-23 NOTE — Telephone Encounter (Signed)
Called for verbal orders for in home PT. Verbal orders given

## 2020-08-24 ENCOUNTER — Other Ambulatory Visit: Payer: Self-pay

## 2020-08-24 ENCOUNTER — Encounter: Payer: Self-pay | Admitting: Nurse Practitioner

## 2020-08-24 ENCOUNTER — Ambulatory Visit (INDEPENDENT_AMBULATORY_CARE_PROVIDER_SITE_OTHER): Payer: Medicare Other | Admitting: Nurse Practitioner

## 2020-08-24 VITALS — BP 145/64 | HR 60 | Temp 97.9°F | Resp 16 | Ht 67.0 in | Wt 199.0 lb

## 2020-08-24 DIAGNOSIS — R569 Unspecified convulsions: Secondary | ICD-10-CM | POA: Diagnosis not present

## 2020-08-24 DIAGNOSIS — R269 Unspecified abnormalities of gait and mobility: Secondary | ICD-10-CM | POA: Diagnosis not present

## 2020-08-24 DIAGNOSIS — R600 Localized edema: Secondary | ICD-10-CM | POA: Diagnosis not present

## 2020-08-24 DIAGNOSIS — E1165 Type 2 diabetes mellitus with hyperglycemia: Secondary | ICD-10-CM | POA: Diagnosis not present

## 2020-08-24 DIAGNOSIS — I5043 Acute on chronic combined systolic (congestive) and diastolic (congestive) heart failure: Secondary | ICD-10-CM

## 2020-08-24 DIAGNOSIS — J42 Unspecified chronic bronchitis: Secondary | ICD-10-CM

## 2020-08-24 DIAGNOSIS — E1122 Type 2 diabetes mellitus with diabetic chronic kidney disease: Secondary | ICD-10-CM | POA: Diagnosis not present

## 2020-08-24 DIAGNOSIS — I4819 Other persistent atrial fibrillation: Secondary | ICD-10-CM | POA: Diagnosis not present

## 2020-08-24 DIAGNOSIS — N179 Acute kidney failure, unspecified: Secondary | ICD-10-CM | POA: Diagnosis not present

## 2020-08-24 DIAGNOSIS — N184 Chronic kidney disease, stage 4 (severe): Secondary | ICD-10-CM | POA: Diagnosis not present

## 2020-08-24 DIAGNOSIS — I130 Hypertensive heart and chronic kidney disease with heart failure and stage 1 through stage 4 chronic kidney disease, or unspecified chronic kidney disease: Secondary | ICD-10-CM | POA: Diagnosis not present

## 2020-08-24 NOTE — Assessment & Plan Note (Addendum)
-  she was started on Keppra after having a seizure while hospitalized -has not had any seizures since she started keppra -continue keppra per neuro recommendations

## 2020-08-24 NOTE — Progress Notes (Signed)
Established Patient Office Visit  Subjective:  Patient ID: [name] [name], female    DOB: 14-Jul-1932  Age: 86 y.o. MRN: [id number]  CC:  Chief Complaint  Patient presents with  . Congestive Heart Failure  . Hypertension  . Seizures    HPI [name] [name] presents for transfer of care visit.  She was discharged from [facility] on 08/11/20 after a hospital stay for acute on chronic heart failure. She was discharged home with [facility] PT/OT and RN with [facility].  During her hospital stay, she had palliative care consult, and was recommended to have outpatient palliative care. She has DNR in place now. She had a seizure while hospitalized, so she was started on Keppra.  She has hx of respiratory failure and requires continuous O2 at 3 L/m via [place].  She had ICD-defibrillator placed d/t complete heart block.  She has INR monitored by cardiology d/t AVR/MVR.  She also has hx of HTN, CKD, and DM2.  Past Medical History:  Diagnosis Date  . Acute on chronic diastolic CHF (congestive heart failure) ([place]) 05/19/2012  . Acute on chronic respiratory failure with hypoxia ([place]) 01/31/2020  . Adenomatous polyp of colon 12/16/2002   Dr. [name]/[facility]  . Allergy   . Calculus of gallbladder with chronic cholecystitis without obstruction 02/09/2009   Qualifier: Diagnosis of  By: [name], [name]    . Cataract   . CHB (complete heart block) ([place]) 10/07/2014      . CHF (congestive heart failure) ([place])    a.  EF previously reduced to 25-30% b. EF improved to 60-65% by echo in 10/2017.   Marland Kitchen Chronic gout due to renal impairment    Per [facility] Electronic [facility] Records [facility]   . Chronic kidney disease, stage I 2006  . Cognitive communication deficit    Per [facility] Records [facility]   . Constipation       . Convulsion ([place])    Per [facility] Records [facility]     . COPD (chronic obstructive pulmonary disease) ([place])       . DJD (degenerative joint disease) of lumbar spine   . DM (diabetes mellitus) ([place]) 2006  . Dysphagia, unspecified    Per [facility] Records [facility]   . Esophageal reflux   . Glaucoma   . Gout       . H/O adenomatous polyp of colon 05/24/2012   2004 in [place] colonoscopy 2008 normal    . H/O heart valve replacement with mechanical valve    a. s/p mechanical AVR in 2001 and mechanical MVR in 2004.  Marland Kitchen H/O wisdom tooth extraction   . HIP PAIN, RIGHT 04/23/2009   Qualifier: Diagnosis of  By: [name], [name]    . Hyperkalemia    Per [facility] Records [facility]   . Hyperlipemia   . Hypertensive heart and kidney disease with HF and with CKD stage I-IV ([place])    Per [facility] Records [facility]   . IDA (iron deficiency anemia)    Parenteral iron/[name]  . Insomnia       . Localized edema    Left arm, Per [facility] Records [facility]   . Long term (current) use of anticoagulants    Per [facility] Records [facility]   .  Nausea without vomiting 06/07/2010   Qualifier: Diagnosis of  By: [name], [name]    . Non-ischemic cardiomyopathy ([place])    a. s/p MDT CRTD  . Obesity, unspecified   . Oxygen deficiency 2014   nocturnal;  . Papilloma of breast 03/06/2012   This was diagnosed as a left breast papilloma by ultrasound characteristics and symptoms in 2010. Because of possible medical risk factors no surgical intervention has been done and it did doing annual followups. The area has been stable by physical examination and mammograms and ultrasound since 2010.   . Presence of cardiac pacemaker    Per [facility] Records [facility]   . Spinal stenosis of lumbar region 02/19/2013  . Spondylolisthesis   .  Spondylosis   . Thyroid cancer ([name])    remote thyroidectomy, no recurrence, pt denies in 2016 thyroid cancer  . Type 2 diabetes mellitus with hyperglycemia ([place]) 07/15/2010  . Type 2 diabetes mellitus with hypoglycemia without coma ([place])    Per [facility] Records [facility]   . Unspecified chronic bronchitis ([place])    Per [facility] Records [facility]   . Unspecified hypothyroidism       . Urinary tract infection, site not specified    Per [facility] Records [facility]   . 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 Vertigo         Past Surgical History:  Procedure Laterality Date  . AORTIC AND MITRAL VALVE REPLACEMENT  2001  . BI-VENTRICULAR IMPLANTABLE CARDIOVERTER DEFIBRILLATOR UPGRADE N/A 01/18/2015   Procedure: BI-VENTRICULAR IMPLANTABLE CARDIOVERTER DEFIBRILLATOR UPGRADE;  Surgeon: Evans Lance, MD;  Location: [facility] CATH LAB;  Service: Cardiovascular;  Laterality: N/A;  . BIV ICD GENERATOR CHANGEOUT N/A 01/30/2019   Procedure: BIV ICD GENERATOR CHANGEOUT;  Surgeon: Evans Lance, MD;  Location: [facility] CV LAB;  Service: Cardiovascular;  Laterality: N/A;  . CARDIAC CATHETERIZATION    . CARDIAC VALVE REPLACEMENT    . CARDIOVERSION N/A 11/13/2016   Procedure: CARDIOVERSION;  Surgeon: Sanda Klein, MD;  Location: [facility] ENDOSCOPY;  Service: Cardiovascular;  Laterality: N/A;  . COLONOSCOPY  06/12/2007   Rourk- Normal rectum/Normal colon  . COLONOSCOPY  12/16/02   small hemorrhoids  . COLONOSCOPY  06/20/2012   Procedure: COLONOSCOPY;  Surgeon: Daneil Dolin, MD;  Location: [facility] ENDO SUITE;  Service: Endoscopy;  Laterality: N/A;  10:45  . defibrillator implanted 2006    . DOPPLER ECHOCARDIOGRAPHY  2012  . DOPPLER ECHOCARDIOGRAPHY  05,06,07,08,09,2011  . ESOPHAGOGASTRODUODENOSCOPY  06/12/2007   Rourk- normal esophagus, small hiatal hernia, otherwise normal  stomach, D1, D2  . EYE SURGERY Right 2005  . EYE SURGERY Left 2006  . ICD LEAD REMOVAL Left 02/08/2015   Procedure: ICD LEAD REMOVAL;  Surgeon: Evans Lance, MD;  Location: [facility] OR;  Service: Cardiovascular;  Laterality: Left;  "Will plan extraction and insertion of a BiV PM"  **Dr. Roxan Hockey backing up case**  . IMPLANTABLE CARDIOVERTER DEFIBRILLATOR (ICD) GENERATOR CHANGE Left 02/08/2015   Procedure: ICD GENERATOR CHANGE;  Surgeon: Evans Lance, MD;  Location: [place];  Service: Cardiovascular;  Laterality: Left;  . INSERT / REPLACE / REMOVE PACEMAKER    . PACEMAKER INSERTION  May 2016  . pacemaker placed  2004  . right breast cyst removed     benign   . right cataract removed     2005  . TEE WITHOUT  CARDIOVERSION N/A 11/13/2016   Procedure: TRANSESOPHAGEAL ECHOCARDIOGRAM (TEE);  Surgeon: Sanda Klein, MD;  Location: [facility] ENDOSCOPY;  Service: Cardiovascular;  Laterality: N/A;  . THYROIDECTOMY      Family History  Problem Relation Age of Onset  . Pancreatic cancer Mother   . Heart disease Father   . Heart disease Sister   . Heart attack Sister   . Heart disease Brother   . Diabetes Brother   . Heart disease Brother   . Diabetes Brother   . Hypertension Brother   . Lung cancer Brother     Social History   Socioeconomic History  . Marital status: Widowed    Spouse name: Not on file  . Number of children: 0  . Years of education: Not on file  . Highest education level: Not on file  Occupational History  . Occupation: retired    Fish farm manager: RETIRED  Tobacco Use  . Smoking status: Former Smoker    Packs/day: 0.75    Years: 40.00    Pack years: 30.00    Types: Cigarettes    Quit date: 03/08/1987    Years since quitting: 33.4  . Smokeless tobacco: Never Used  Vaping Use  . Vaping Use: Never used  Substance and Sexual Activity  . Alcohol use: No    Alcohol/week: 0.0 standard drinks  . Drug use: No  . Sexual activity: Not Currently  Other Topics Concern  . Not on  file  Social History Narrative   From [place] (2004)   Social Determinants of Health   Financial Resource Strain: Low Risk   . Difficulty of Paying Living Expenses: Not hard at all  Food Insecurity: No Food Insecurity  . Worried About Charity fundraiser in the Last Year: Never true  . Ran Out of Food in the Last Year: Never true  Transportation Needs: No Transportation Needs  . Lack of Transportation (Medical): No  . Lack of Transportation (Non-Medical): No  Physical Activity: Inactive  . Days of Exercise per Week: 0 days  . Minutes of Exercise per Session: 0 min  Stress: No Stress Concern Present  . Feeling of Stress : Only a little  Social Connections: Moderately Isolated  . Frequency of Communication with Friends and Family: More than three times a week  . Frequency of Social Gatherings with Friends and Family: Twice a week  . Attends Religious Services: More than 4 times per year  . Active Member of Clubs or Organizations: No  . Attends Archivist Meetings: Never  . Marital Status: Widowed  Intimate Partner Violence:   . Fear of Current or Ex-Partner: Not on file  . Emotionally Abused: Not on file  . Physically Abused: Not on file  . Sexually Abused: Not on file    Outpatient Medications Prior to Visit  Medication Sig Dispense Refill  . acetaminophen (TYLENOL) 500 MG tablet Take 1 tablet (500 mg total) by mouth every 6 (six) hours as needed (pain). 30 tablet 0  . albuterol (PROVENTIL) (2.5 MG/3ML) 0.083% nebulizer solution Take 3 mLs (2.5 mg total) by nebulization in the morning and at bedtime. *May use additional one time if needed for shortness of breath 175 mL 0  . albuterol (VENTOLIN HFA) 108 (90 Base) MCG/ACT inhaler Inhale 1 puff into the lungs every 6 (six) hours as needed for wheezing or shortness of breath. 6.7 g 0  . allopurinol (ZYLOPRIM) 100 MG tablet TAKE 1 TABLET(100 MG) BY MOUTH DAILY 30 tablet 0  . BREO  ELLIPTA 100-25 MCG/INH AEPB Inhale 1 puff  into the lungs daily. 1 TAKE 1 INHALATION BY MOUTH ONCE DAILY 28 each 0  . cholecalciferol (VITAMIN D3) 25 MCG (1000 UNIT) tablet Take 1 tablet (1,000 Units total) by mouth in the morning.    . fluticasone (FLONASE) 50 MCG/ACT nasal spray Place 2 sprays into both nostrils daily. 48 g 1  . folic acid (FOLVITE) 1 MG tablet TAKE 1 TABLET(1 MG) BY MOUTH DAILY 30 tablet 0  . furosemide (LASIX) 40 MG tablet Take 1 tablet (40 mg total) by mouth daily. Start 07/13/2020 30 tablet 0  . gabapentin (NEURONTIN) 300 MG capsule TAKE 1 CAPSULE(300 MG) BY MOUTH TWICE DAILY 60 capsule 0  . levETIRAcetam (KEPPRA) 500 MG tablet Take 1 tablet (500 mg total) by mouth 2 (two) times daily. 9 am and 9 pm 60 tablet 0  . Liniments (SALONPAS PAIN RELIEF PATCH EX) Apply 1 patch topically daily as needed (pain).     . metoprolol tartrate (LOPRESSOR) 50 MG tablet Take 1 tablet (50 mg total) by mouth 2 (two) times daily. 9 am & 9 pm 60 tablet 0  . nitroGLYCERIN (NITROSTAT) 0.4 MG SL tablet Place 1 tablet (0.4 mg total) under the tongue every 5 (five) minutes as needed for chest pain. 30 tablet 0  . NON FORMULARY Diet Change: Soft diet with dys 2 (ground) meats, thin liquids (NAS, cons CHO, heart healthy)    . ondansetron (ZOFRAN ODT) 4 MG disintegrating tablet Take 1 tablet (4 mg total) by mouth every 8 (eight) hours as needed for nausea or vomiting. 10 tablet 0  . OXYGEN Inhale 3 L into the lungs continuous.    [name] [name] (SYSTANE) 0.4-0.3 % SOLN Apply 1 drop to eye at bedtime. Left eye    . polyethylene glycol (MIRALAX / GLYCOLAX) 17 g packet Take 17 g by mouth daily as needed. 14 each 0  . potassium chloride SA (KLOR-CON) 20 MEQ tablet Take 1 tablet (20 mEq total) by mouth daily. 9 am 30 tablet 0  . pravastatin (PRAVACHOL) 40 MG tablet Take 1 tablet (40 mg total) by mouth daily. 30 tablet 0  . senna (SENOKOT) 8.6 MG TABS tablet Take 2 tablets (17.2 mg total) by mouth daily as needed for mild constipation.  120 tablet 0  . SYNTHROID 75 MCG tablet Take 1 tablet (75 mcg total) by mouth daily before breakfast. 30 tablet 0  . warfarin (COUMADIN) 10 MG tablet Take 1 tablet (10 mg total) by mouth daily at 4 PM. 30 tablet 0   Facility-Administered Medications Prior to Visit  Medication Dose Route Frequency Provider Last Rate Last Admin  . epoetin alfa (EPOGEN,PROCRIT) injection 40,000 Units  40,000 Units Subcutaneous Once Farrel Gobble, MD        Allergies  Allergen Reactions  . Penicillins Other (See Comments)    Bruise    . Aspirin Other (See Comments)    On coumadin  . Fentanyl Dermatitis    Rash with patch   . Niacin Itching    ROS Review of Systems  Constitutional: Positive for fatigue.  Respiratory: Negative.   Cardiovascular: Positive for leg swelling.  Musculoskeletal: Positive for arthralgias.  Neurological: Negative.   Psychiatric/Behavioral: Negative.       Objective:    Physical Exam Constitutional:      Appearance: She is obese.  Cardiovascular:     Rate and Rhythm: Normal rate and regular rhythm.     Heart sounds: Murmur heard.  Pulmonary:     Effort: Pulmonary effort is normal.     Breath sounds: Normal breath sounds.     Comments: Wears O2 at 3L Musculoskeletal:        General: Swelling present.     Comments: 2+ pitting edema to bilateral lower extremities  Neurological:     Mental Status: She is alert.     BP (!) 145/64   Pulse 60   Temp 97.9 F (36.6 C)   Resp 16   Ht 5\' 7"  (1.702 m)   Wt 199 lb (90.3 kg)   SpO2 95%   BMI 31.17 kg/m  Wt Readings from Last 3 Encounters:  08/24/20 199 lb (90.3 kg)  08/11/20 195 lb 6.4 oz (88.6 kg)  08/10/20 199 lb 6.4 oz (90.4 kg)     Health Maintenance Due  Topic Date Due  . URINE MICROALBUMIN  06/28/2018  . FOOT EXAM  07/18/2019    There are no preventive care reminders to display for this patient.  Lab Results  Component Value Date   TSH 2.32 02/16/2020   Lab Results  Component Value  Date   WBC 7.2 07/08/2020   HGB 9.2 (L) 07/08/2020   HCT 30.7 (L) 07/08/2020   MCV 95.6 07/08/2020   PLT 190 07/08/2020   Lab Results  Component Value Date   NA 137 08/09/2020   K 4.9 08/09/2020   CO2 33 (H) 08/09/2020   GLUCOSE 88 08/09/2020   BUN 40 (H) 08/09/2020   CREATININE 1.44 (H) 08/09/2020   BILITOT 0.6 07/07/2020   ALKPHOS 48 07/07/2020   AST 27 07/07/2020   ALT 18 07/07/2020   PROT 6.5 07/07/2020   ALBUMIN 2.7 (L) 07/07/2020   CALCIUM 8.8 (L) 08/09/2020   ANIONGAP 6 08/09/2020   Lab Results  Component Value Date   CHOL 124 01/05/2020   Lab Results  Component Value Date   HDL 44 (L) 01/05/2020   Lab Results  Component Value Date   LDLCALC 65 01/05/2020   Lab Results  Component Value Date   TRIG 73 01/05/2020   Lab Results  Component Value Date   CHOLHDL 2.8 01/05/2020   Lab Results  Component Value Date   HGBA1C 5.8 (A) 03/23/2020   HGBA1C 5.8 03/23/2020   HGBA1C 5.8 03/23/2020   HGBA1C 5.8 03/23/2020      Assessment & Plan:   Problem List Items Addressed This Visit      Cardiovascular and Mediastinum   Acute on chronic congestive heart failure ([place]) - Primary    -she had 5.5 L diuresed while at hospital -she still has 2+ pitting edema to bilateral legs -her heart failure exacerbation triggered an inpatient palliative care consult, and she is now DNR -we discussed getting daily weights and elevating her legs -she should take an extra lasix pill for the next 2 days since her legs are swelling -notify office if she gains 2 pounds in a day or 5 pounds in a week        Respiratory   COPD (chronic obstructive pulmonary disease) ([place])    -wears 3L via nasal cannula -no issues with breathing today -use pulse ox at home; she has been trying to decrease her oxygen use, and we discussed her O2 sat should remain above 88% -today O2 sat is 95% on 3L        Other   Bilateral lower extremity edema    -2+ pitting edema to both legs today -she  states she didn't  sleep with her feet elevated last night -take an extra lasix x2 days        Seizure ([place])    -she was started on Keppra after having a seizure while hospitalized -has not had any seizures since she started keppra -continue keppra per neuro recommendations      Gait abnormality    -she has home PT/OT and RN ordered from [facility] -she states that she stopped OT -PT and RN form [facility] are still caring for her; will request notes         No orders of the defined types were placed in this encounter.   Follow-up: Return in about 3 months (around 11/22/2020) for Chronic care follow-up.    Noreene Larsson, NP

## 2020-08-24 NOTE — Assessment & Plan Note (Signed)
-  she has home PT/OT and RN ordered from [facility] -she states that she stopped OT -PT and RN form [facility] are still caring for her; will request notes

## 2020-08-24 NOTE — Addendum Note (Signed)
Addended by: Demetrius Revel on: 08/24/2020 11:46 AM   Modules accepted: Orders

## 2020-08-24 NOTE — Assessment & Plan Note (Signed)
-  2+ pitting edema to both legs today -she states she didn't sleep with her feet elevated last night -take an extra lasix x2 days

## 2020-08-24 NOTE — Assessment & Plan Note (Signed)
-  wears 3L via nasal cannula -no issues with breathing today -use pulse ox at home; she has been trying to decrease her oxygen use, and we discussed her O2 sat should remain above 88% -today O2 sat is 95% on 3L

## 2020-08-24 NOTE — Assessment & Plan Note (Signed)
-  she had 5.5 L diuresed while at hospital -she still has 2+ pitting edema to bilateral legs -her heart failure exacerbation triggered an inpatient palliative care consult, and she is now DNR -we discussed getting daily weights and elevating her legs -she should take an extra lasix pill for the next 2 days since her legs are swelling -notify office if she gains 2 pounds in a day or 5 pounds in a week

## 2020-08-25 ENCOUNTER — Other Ambulatory Visit: Payer: Self-pay | Admitting: *Deleted

## 2020-08-25 ENCOUNTER — Encounter: Payer: Self-pay | Admitting: *Deleted

## 2020-08-25 NOTE — Patient Instructions (Signed)
Goals Addressed            This Visit's Progress    [facility] CM: Learn More About My Health   On track    Follow Up Date 09/22/20   - tell my story and reason for my visit - make a list of questions - ask questions - repeat what I heard to make sure I understand - bring a list of my medicines to the visit - speak up when I don't understand    Why is this important?   The best way to learn about your health and care is by talking to the doctor and nurse.  They will answer your questions and give you information in the way that you like best.    Notes:      [facility] CM: Maintain My Quality of Life   On track    Follow Up Date 09/22/20   - complete a living will - discuss my treatment options with the doctor or nurse - make shared treatment decisions with doctor - name a health care proxy (decision maker)    Why is this important?   Having a long-term illness can be scary.  It can also be stressful for you and your caregiver.  These steps may help.    Notes:   08/16/20:  -- will send patient printed information around creating Advanced Directives for Living Will/ HCPOA  08/25/20: -- confirmed with caregiver/ patient that patient discussed with PCP yesterday at office visit; discussed need for patient to obtain DNR/ MOST form for posting in her home; provided education to facilitate obtaining form from PCP provider     [facility] CM: Make and Keep All Appointments   On track    Follow Up Date 09/22/20   - arrange a ride through an agency 1 week before appointment - ask family or friend for a ride - keep a calendar with appointment dates    Why is this important?   Part of staying healthy is seeing the doctor for follow-up care.  If you forget your appointments, there are some things you can do to stay on track.    Notes:   08/25/20: --confirmed that patient's CNA continues to provide transportation to provider appointments; confirmed accurate understanding of upcoming scheduled  provider appointments     [facility] CM: Protect My Health   On track    Follow Up Date 09/22/20   - schedule appointment for vaccines needed due to my age or health - schedule recommended health tests (blood work, mammogram, colonoscopy, pap test) - schedule and keep appointment for annual check-up    Why is this important?   Screening tests can find diseases early when they are easier to treat.  Your doctor or nurse will talk with you about which tests are important for you.  Getting shots for common diseases like the flu and shingles will help prevent them.     Notes:      [facility]N CM: Track and Manage Fluids and Swelling   On track    Follow Up Date 09/22/20   - call office if I gain more than 2 pounds in one day or 5 pounds in one week - do ankle pumps when sitting - keep legs up while sitting - track weight in diary - use salt in moderation - watch for swelling in feet, ankles and legs every day - weigh myself daily    Why is this important?   It is important to check  your weight daily and watch how much salt and liquids you have.  It will help you to manage your heart failure.    Notes:   08/16/20: -- patient reports daily weight monitoring and recording: reports post- SNF discharge weights 188-190 lbs -- discussed patient's baseline use of home Oc: 2-3 L/min via [place]; patient using continuously  08/25/20: -- reviewed yesterday's PCP office visit with patient and caregiver; confirmed that patient took extra diuretic/ fluid pills as directed post- PCP appointment -- reviewed with patient/ caregiver recent weights at home; confirmed target weight of "185 lbs;" reinforced previously provided education around weight gain guidelines in setting of CHF along with corrsponding action plan for same -- confirmed that patient continues using outpatient pharmacy blister packaging for medications and that no other changes to medications were made -- discussed ongoing use of home O2 with  patient/ caregiver and confirmed no changes- continues using at 2-3 L/min via [place] -- discussed additional signs/ symptoms CHF yellow zone and confirmed no current clinical concerns     [facility] CM: Track and Manage Symptoms   On track    Follow Up Date 09/22/20   - develop a rescue plan - eat more whole grains, fruits and vegetables, lean meats and healthy fats - follow rescue plan if symptoms flare-up - know when to call the doctor - track symptoms and what helps feel better or worse - dress right for the weather, hot or cold    Why is this important?   You will be able to handle your symptoms better if you keep track of them.  Making some simple changes to your lifestyle will help.  Eating healthy is one thing you can do to take good care of yourself.    Notes:   08/25/20: -- confirmed that patient follows heart healthy/ low salt diet- reinforced importance of limiting salt in diet in setting of CHF       Low-Sodium Eating Plan Sodium, which is an element that makes up salt, helps you maintain a healthy balance of fluids in your body. Too much sodium can increase your blood pressure and cause fluid and waste to be held in your body. Your health care provider or dietitian may recommend following this plan if you have high blood pressure (hypertension), kidney disease, liver disease, or heart failure. Eating less sodium can help lower your blood pressure, reduce swelling, and protect your heart, liver, and kidneys. What are tips for following this plan? General guidelines  Most people on this plan should limit their sodium intake to 1,500-2,000 mg (milligrams) of sodium each day. Reading food labels   The Nutrition Facts label lists the amount of sodium in one serving of the food. If you eat more than one serving, you must multiply the listed amount of sodium by the number of servings.  Choose foods with less than 140 mg of sodium per serving.  Avoid foods with 300 mg of sodium or  more per serving. Shopping  Look for lower-sodium products, often labeled as "low-sodium" or "no salt added."  Always check the sodium content even if foods are labeled as "unsalted" or "no salt added".  Buy fresh foods. ? Avoid canned foods and premade or frozen meals. ? Avoid canned, cured, or processed meats  Buy breads that have less than 80 mg of sodium per slice. Cooking  Eat more home-cooked food and less restaurant, buffet, and fast food.  Avoid adding salt when cooking. Use salt-free seasonings or herbs instead of  table salt or sea salt. Check with your health care provider or pharmacist before using salt substitutes.  Cook with plant-based oils, such as canola, sunflower, or olive oil. Meal planning  When eating at a restaurant, ask that your food be prepared with less salt or no salt, if possible.  Avoid foods that contain MSG (monosodium glutamate). MSG is sometimes added to [place] food, bouillon, and some canned foods. What foods are recommended? The items listed may not be a complete list. Talk with your dietitian about what dietary choices are best for you. Grains Low-sodium cereals, including oats, puffed wheat and rice, and shredded wheat. Low-sodium crackers. Unsalted rice. Unsalted pasta. Low-sodium bread. Whole-grain breads and whole-grain pasta. Vegetables Fresh or frozen vegetables. "No salt added" canned vegetables. "No salt added" tomato sauce and paste. Low-sodium or reduced-sodium tomato and vegetable juice. Fruits Fresh, frozen, or canned fruit. Fruit juice. Meats and other protein foods Fresh or frozen (no salt added) meat, poultry, seafood, and fish. Low-sodium canned tuna and salmon. Unsalted nuts. Dried peas, beans, and lentils without added salt. Unsalted canned beans. Eggs. Unsalted nut butters. Dairy Milk. Soy milk. Cheese that is naturally low in sodium, such as ricotta cheese, fresh mozzarella, or Swiss cheese Low-sodium or reduced-sodium  cheese. Cream cheese. Yogurt. Fats and oils Unsalted butter. Unsalted margarine with no trans fat. Vegetable oils such as canola or olive oils. Seasonings and other foods Fresh and dried herbs and spices. Salt-free seasonings. Low-sodium mustard and ketchup. Sodium-free salad dressing. Sodium-free light mayonnaise. Fresh or refrigerated horseradish. Lemon juice. Vinegar. Homemade, reduced-sodium, or low-sodium soups. Unsalted popcorn and pretzels. Low-salt or salt-free chips. What foods are not recommended? The items listed may not be a complete list. Talk with your dietitian about what dietary choices are best for you. Grains Instant hot cereals. Bread stuffing, pancake, and biscuit mixes. Croutons. Seasoned rice or pasta mixes. Noodle soup cups. Boxed or frozen macaroni and cheese. Regular salted crackers. Self-rising flour. Vegetables Sauerkraut, pickled vegetables, and relishes. Olives. [place] fries. Onion rings. Regular canned vegetables (not low-sodium or reduced-sodium). Regular canned tomato sauce and paste (not low-sodium or reduced-sodium). Regular tomato and vegetable juice (not low-sodium or reduced-sodium). Frozen vegetables in sauces. Meats and other protein foods Meat or fish that is salted, canned, smoked, spiced, or pickled. Bacon, ham, sausage, hotdogs, corned beef, chipped beef, packaged lunch meats, salt pork, jerky, pickled herring, anchovies, regular canned tuna, sardines, salted nuts. Dairy Processed cheese and cheese spreads. Cheese curds. Blue cheese. Feta cheese. String cheese. Regular cottage cheese. Buttermilk. Canned milk. Fats and oils Salted butter. Regular margarine. Ghee. Bacon fat. Seasonings and other foods Onion salt, garlic salt, seasoned salt, table salt, and sea salt. Canned and packaged gravies. Worcestershire sauce. Tartar sauce. Barbecue sauce. Teriyaki sauce. Soy sauce, including reduced-sodium. Steak sauce. Fish sauce. Oyster sauce. Cocktail sauce.  Horseradish that you find on the shelf. Regular ketchup and mustard. Meat flavorings and tenderizers. Bouillon cubes. Hot sauce and Tabasco sauce. Premade or packaged marinades. Premade or packaged taco seasonings. Relishes. Regular salad dressings. Salsa. Potato and tortilla chips. Corn chips and puffs. Salted popcorn and pretzels. Canned or dried soups. Pizza. Frozen entrees and pot pies. Summary  Eating less sodium can help lower your blood pressure, reduce swelling, and protect your heart, liver, and kidneys.  Most people on this plan should limit their sodium intake to 1,500-2,000 mg (milligrams) of sodium each day.  Canned, boxed, and frozen foods are high in sodium. Restaurant foods, fast foods, and pizza are  also very high in sodium. You also get sodium by adding salt to food.  Try to cook at home, eat more fresh fruits and vegetables, and eat less fast food, canned, processed, or prepared foods. This information is not intended to replace advice given to you by your health care provider. Make sure you discuss any questions you have with your health care provider. Document Revised: 08/24/2017 Document Reviewed: 09/04/2016 Elsevier Patient Education  [place] DASH stands for "Dietary Approaches to Stop Hypertension." The DASH eating plan is a healthy eating plan that has been shown to reduce high blood pressure (hypertension). It may also reduce your risk for type 2 diabetes, heart disease, and stroke. The DASH eating plan may also help with weight loss. What are tips for following this plan?  General guidelines  Avoid eating more than 2,300 mg (milligrams) of salt (sodium) a day. If you have hypertension, you may need to reduce your sodium intake to 1,500 mg a day.  Limit alcohol intake to no more than 1 drink a day for nonpregnant women and 2 drinks a day for men. One drink equals 12 oz of beer, 5 oz of wine, or 1 oz of hard liquor.  Work with your  health care provider to maintain a healthy body weight or to lose weight. Ask what an ideal weight is for you.  Get at least 30 minutes of exercise that causes your heart to beat faster (aerobic exercise) most days of the week. Activities may include walking, swimming, or biking.  Work with your health care provider or diet and nutrition specialist (dietitian) to adjust your eating plan to your individual calorie needs. Reading food labels   Check food labels for the amount of sodium per serving. Choose foods with less than 5 percent of the Daily Value of sodium. Generally, foods with less than 300 mg of sodium per serving fit into this eating plan.  To find whole grains, look for the word "whole" as the first word in the ingredient list. Shopping  Buy products labeled as "low-sodium" or "no salt added."  Buy fresh foods. Avoid canned foods and premade or frozen meals. Cooking  Avoid adding salt when cooking. Use salt-free seasonings or herbs instead of table salt or sea salt. Check with your health care provider or pharmacist before using salt substitutes.  Do not fry foods. Cook foods using healthy methods such as baking, boiling, grilling, and broiling instead.  Cook with heart-healthy oils, such as olive, canola, soybean, or sunflower oil. Meal planning  Eat a balanced diet that includes: ? 5 or more servings of fruits and vegetables each day. At each meal, try to fill half of your plate with fruits and vegetables. ? Up to 6-8 servings of whole grains each day. ? Less than 6 oz of lean meat, poultry, or fish each day. A 3-oz serving of meat is about the same size as a deck of cards. One egg equals 1 oz. ? 2 servings of low-fat dairy each day. ? A serving of nuts, seeds, or beans 5 times each week. ? Heart-healthy fats. Healthy fats called Omega-3 fatty acids are found in foods such as flaxseeds and coldwater fish, like sardines, salmon, and mackerel.  Limit how much you eat of  the following: ? Canned or prepackaged foods. ? Food that is high in trans fat, such as fried foods. ? Food that is high in saturated fat, such as fatty meat. ? Sweets,  desserts, sugary drinks, and other foods with added sugar. ? Full-fat dairy products.  Do not salt foods before eating.  Try to eat at least 2 vegetarian meals each week.  Eat more home-cooked food and less restaurant, buffet, and fast food.  When eating at a restaurant, ask that your food be prepared with less salt or no salt, if possible. What foods are recommended? The items listed may not be a complete list. Talk with your dietitian about what dietary choices are best for you. Grains Whole-grain or whole-wheat bread. Whole-grain or whole-wheat pasta. Brown rice. [place]. Bulgur. Whole-grain and low-sodium cereals. Pita bread. Low-fat, low-sodium crackers. Whole-wheat flour tortillas. Vegetables Fresh or frozen vegetables (raw, steamed, roasted, or grilled). Low-sodium or reduced-sodium tomato and vegetable juice. Low-sodium or reduced-sodium tomato sauce and tomato paste. Low-sodium or reduced-sodium canned vegetables. Fruits All fresh, dried, or frozen fruit. Canned fruit in natural juice (without added sugar). Meat and other protein foods Skinless chicken or [place]. Ground chicken or [place]. Pork with fat trimmed off. Fish and seafood. Egg whites. Dried beans, peas, or lentils. Unsalted nuts, nut butters, and seeds. Unsalted canned beans. Lean cuts of beef with fat trimmed off. Low-sodium, lean deli meat. Dairy Low-fat (1%) or fat-free (skim) milk. Fat-free, low-fat, or reduced-fat cheeses. Nonfat, low-sodium ricotta or cottage cheese. Low-fat or nonfat yogurt. Low-fat, low-sodium cheese. Fats and oils Soft margarine without trans fats. Vegetable oil. Low-fat, reduced-fat, or light mayonnaise and salad dressings (reduced-sodium). Canola, safflower, olive, soybean, and sunflower oils. Avocado. Seasoning and  other foods Herbs. Spices. Seasoning mixes without salt. Unsalted popcorn and pretzels. Fat-free sweets. What foods are not recommended? The items listed may not be a complete list. Talk with your dietitian about what dietary choices are best for you. Grains Baked goods made with fat, such as croissants, muffins, or some breads. Dry pasta or rice meal packs. Vegetables Creamed or fried vegetables. Vegetables in a cheese sauce. Regular canned vegetables (not low-sodium or reduced-sodium). Regular canned tomato sauce and paste (not low-sodium or reduced-sodium). Regular tomato and vegetable juice (not low-sodium or reduced-sodium). Angie Fava. Olives. Fruits Canned fruit in a light or heavy syrup. Fried fruit. Fruit in cream or butter sauce. Meat and other protein foods Fatty cuts of meat. Ribs. Fried meat. [place] Salines. Sausage. Bologna and other processed lunch meats. Salami. Fatback. Hotdogs. Bratwurst. Salted nuts and seeds. Canned beans with added salt. Canned or smoked fish. Whole eggs or egg yolks. Chicken or [place] with skin. Dairy Whole or 2% milk, cream, and half-and-half. Whole or full-fat cream cheese. Whole-fat or sweetened yogurt. Full-fat cheese. Nondairy creamers. Whipped toppings. Processed cheese and cheese spreads. Fats and oils Butter. Stick margarine. Lard. Shortening. Ghee. Bacon fat. Tropical oils, such as coconut, palm kernel, or palm oil. Seasoning and other foods Salted popcorn and pretzels. Onion salt, garlic salt, seasoned salt, table salt, and sea salt. Worcestershire sauce. Tartar sauce. Barbecue sauce. Teriyaki sauce. Soy sauce, including reduced-sodium. Steak sauce. Canned and packaged gravies. Fish sauce. Oyster sauce. Cocktail sauce. Horseradish that you find on the shelf. Ketchup. Mustard. Meat flavorings and tenderizers. Bouillon cubes. Hot sauce and Tabasco sauce. Premade or packaged marinades. Premade or packaged taco seasonings. Relishes. Regular salad dressings. Where to  find more information:  National Heart, Lung, and [place]: https://wilson-eaton.com/  American Heart Association: www.heart.org Summary  The DASH eating plan is a healthy eating plan that has been shown to reduce high blood pressure (hypertension). It may also reduce your risk for type 2 diabetes, heart  disease, and stroke.  With the DASH eating plan, you should limit salt (sodium) intake to 2,300 mg a day. If you have hypertension, you may need to reduce your sodium intake to 1,500 mg a day.  When on the DASH eating plan, aim to eat more fresh fruits and vegetables, whole grains, lean proteins, low-fat dairy, and heart-healthy fats.  Work with your health care provider or diet and nutrition specialist (dietitian) to adjust your eating plan to your individual calorie needs. This information is not intended to replace advice given to you by your health care provider. Make sure you discuss any questions you have with your health care provider. Document Revised: 08/24/2017 Document Reviewed: 09/04/2016 Elsevier Patient Education  2020 [employer].

## 2020-08-25 NOTE — Patient Outreach (Signed)
[facility]) Care Management [facility] CM Telephone Outreach,  Confirmed PCP completed Transition of Care follow up on 08/18/20 Post- SNF discharge day # 13  08/25/2020  [name] [name] [birth year] [id number]  Successful initial telephone outreach attempt to [name] [name], 86 y/o female referred to [place] 08/13/20 by [name] RN PAC after patient had recent hospitalization October 5- 14, 2021 for SOB/ pulmonary edema/ acute-on-chronic CHF exacerbation; during her hospitalization, patient had seizure event which complicated her hospitalization; she was discharged from hospital to SNF for rehabilitation and was subsequently discharged from SNF to home/ self-care with home health services in place on August 12, 2020.  Verified through review of EHR that PCP office completed transition of care outreach 08/18/20, with scheduled transition of care office visit yesterday- THN CM TOC program closed accordingly.  HIPAA/ identity verified and purpose of call/ [name] CM services and program again discussed with patient and her caregiver [name] [name]- verbal consent again provided for [facility] CM involvement in her care post- recent SNF discharge; she again provides verbal consent for her CNA [name] [name] to participate in call, "any time."  Patient reports "doing fine and states that her CNA is at her home assisting her; reports CNA "[name] [name]" assists her 5 days per week and assist with transportation; family continues staying with patient during night time hours.  She denies clinical concerns and sounds to be in no distress throughout call today, although she admits she is "very sleepy;" call completed with patient and caregiver while phone on speaker mode.  -- attended PCP office visit yesterday; reports weight was "high;" confirms that she has taken extra diuretic as instructed- which is why she is sleepy today; states "I was up a lot during night using bathroom;" confirms weight this morning down by 7 lbs, at 192 lbs  (reports was 199 lbs yesterday) -- continues using home O2 2-3 L/min -- denies other medication concerns; confirms she continues using blister packs form outpatient pharmacy- continues to decline medication review, stating PCP reviewed yesterday -- home health services remain active -- previously provided education reinforced with patient and caregiver around self-health management CHF yellow zone signs/ symptoms along with corresponding action plan -- confirmed that patient reports now being DNR code status- discussed need to obtain DNR/ MOST form for posting in patient's home; caregiver reports she will follow up with patient's family to facilitate contacting PCP  Patient denies further issues, concerns, or problems today. I provided/ confirmed patient withmy direct phone number, the main [place] office phone number, and the [facility] CM 24-hour nurse advice phone number should issues arise prior to next scheduled THN CM outreach at the end of this month.  Encouraged patient/ caregiver to contact me directly if needs, questions, issues, or concerns arise prior to next scheduled outreach; they agreed to do so.  Plan:  Patient will take medications as prescribed and will attend all scheduled provider appointments  Patient will promptly notify care providers for any new concerns/ issues/ problems that arise  Patient will actively participate in home health services as ordered post-hospital discharge  Patient will continue monitoring/ recording daily weights and following heart healthy/ low salt diet  I will share today's [facility] CM note/ care plan with patient's PCP as [facility] RN CM initial assessment  THN CM outreach for transition of care to continue with scheduled phone call later this month, unless indicated sooner,  [name], RN, BSN, [employer] Coordinator [facility] Care Management  [phone]

## 2020-08-26 ENCOUNTER — Ambulatory Visit (INDEPENDENT_AMBULATORY_CARE_PROVIDER_SITE_OTHER): Payer: Medicare Other | Admitting: *Deleted

## 2020-08-26 ENCOUNTER — Telehealth: Payer: Self-pay

## 2020-08-26 DIAGNOSIS — Z952 Presence of prosthetic heart valve: Secondary | ICD-10-CM | POA: Diagnosis not present

## 2020-08-26 DIAGNOSIS — N184 Chronic kidney disease, stage 4 (severe): Secondary | ICD-10-CM | POA: Diagnosis not present

## 2020-08-26 DIAGNOSIS — E1122 Type 2 diabetes mellitus with diabetic chronic kidney disease: Secondary | ICD-10-CM | POA: Diagnosis not present

## 2020-08-26 DIAGNOSIS — I4819 Other persistent atrial fibrillation: Secondary | ICD-10-CM | POA: Diagnosis not present

## 2020-08-26 DIAGNOSIS — Z5181 Encounter for therapeutic drug level monitoring: Secondary | ICD-10-CM

## 2020-08-26 DIAGNOSIS — N179 Acute kidney failure, unspecified: Secondary | ICD-10-CM | POA: Diagnosis not present

## 2020-08-26 DIAGNOSIS — I130 Hypertensive heart and chronic kidney disease with heart failure and stage 1 through stage 4 chronic kidney disease, or unspecified chronic kidney disease: Secondary | ICD-10-CM | POA: Diagnosis not present

## 2020-08-26 DIAGNOSIS — I5043 Acute on chronic combined systolic (congestive) and diastolic (congestive) heart failure: Secondary | ICD-10-CM | POA: Diagnosis not present

## 2020-08-26 LAB — POCT INR: INR: 3.4 — AB (ref 2.0–3.0)

## 2020-08-26 NOTE — Patient Instructions (Signed)
Spoke with [facility].  Order given to Hold coumadin tonight then decrease dose to 2 tablets daily except 1 tablet on Saturdays  Continue greens Recheck INR on Thursday 09/02/20

## 2020-08-26 NOTE — Telephone Encounter (Signed)
Patient called today and stated while she was in a facility she had stated she wanted to be a DNR. She no longer wants to be a DNR. She wants them to do everything they can to keep her here. Advised patient I would document it and see how to make it know in her chart

## 2020-08-27 DIAGNOSIS — I4819 Other persistent atrial fibrillation: Secondary | ICD-10-CM | POA: Diagnosis not present

## 2020-08-27 DIAGNOSIS — I130 Hypertensive heart and chronic kidney disease with heart failure and stage 1 through stage 4 chronic kidney disease, or unspecified chronic kidney disease: Secondary | ICD-10-CM | POA: Diagnosis not present

## 2020-08-27 DIAGNOSIS — I5043 Acute on chronic combined systolic (congestive) and diastolic (congestive) heart failure: Secondary | ICD-10-CM | POA: Diagnosis not present

## 2020-08-27 DIAGNOSIS — N184 Chronic kidney disease, stage 4 (severe): Secondary | ICD-10-CM | POA: Diagnosis not present

## 2020-08-27 DIAGNOSIS — E1122 Type 2 diabetes mellitus with diabetic chronic kidney disease: Secondary | ICD-10-CM | POA: Diagnosis not present

## 2020-08-27 DIAGNOSIS — N179 Acute kidney failure, unspecified: Secondary | ICD-10-CM | POA: Diagnosis not present

## 2020-08-30 ENCOUNTER — Ambulatory Visit (INDEPENDENT_AMBULATORY_CARE_PROVIDER_SITE_OTHER): Payer: Medicare Other

## 2020-08-30 DIAGNOSIS — I5042 Chronic combined systolic (congestive) and diastolic (congestive) heart failure: Secondary | ICD-10-CM | POA: Diagnosis not present

## 2020-08-30 DIAGNOSIS — Z95810 Presence of automatic (implantable) cardiac defibrillator: Secondary | ICD-10-CM

## 2020-08-31 DIAGNOSIS — E1122 Type 2 diabetes mellitus with diabetic chronic kidney disease: Secondary | ICD-10-CM | POA: Diagnosis not present

## 2020-08-31 DIAGNOSIS — I4819 Other persistent atrial fibrillation: Secondary | ICD-10-CM | POA: Diagnosis not present

## 2020-08-31 DIAGNOSIS — I5043 Acute on chronic combined systolic (congestive) and diastolic (congestive) heart failure: Secondary | ICD-10-CM | POA: Diagnosis not present

## 2020-08-31 DIAGNOSIS — N179 Acute kidney failure, unspecified: Secondary | ICD-10-CM | POA: Diagnosis not present

## 2020-08-31 DIAGNOSIS — N184 Chronic kidney disease, stage 4 (severe): Secondary | ICD-10-CM | POA: Diagnosis not present

## 2020-08-31 DIAGNOSIS — I130 Hypertensive heart and chronic kidney disease with heart failure and stage 1 through stage 4 chronic kidney disease, or unspecified chronic kidney disease: Secondary | ICD-10-CM | POA: Diagnosis not present

## 2020-08-31 NOTE — Progress Notes (Signed)
Looking better and did not worsen with Thanksgiving!

## 2020-08-31 NOTE — Progress Notes (Signed)
EPIC Encounter for ICM Monitoring  Patient Name: [name] [name] is a 86 y.o. female Date: 08/31/2020 Primary Care Physican: Fayrene Helper, MD Primary Cardiologist:Croitoru Electrophysiologist:Taylor Bi-V Pacing:99.8% 08/24/2020 Office Weight: 199 lbs     Spoke with patient and denies any fluid symptoms.  She has slowly recovered since being in the hospital and nursing home over the last 2 months.  Optivol thoracic impedancenormal.    Prescribed:  Furosemide40 mg1tablet (40 mg total) daily.   Potassium 20 mEq take 1 tablet daily.  Labs: 08/09/2020 Creatinine 1.44, BUN 40, Potassium 4.9, Sodium 137 07/24/2020 Creatinine 1.27, BUN 41, Potassium 4.8, Sodium 133 07/23/2020 Creatinine 1.32, BUN 43, Potassium 5.9, Sodium 143, GFR 37-42  07/08/2020 Creatinine 1.83, BUN 65, Potassium 4.2, Sodium 137 A complete set of results can be found in Results Review.  Recommendations No changes and encouraged to call if experiencing any fluid symptoms.  Follow-up plan: ICM clinic phone appointment on1/06/2021.91 day device clinic remote transmission2/18/2022.  EP/Cardiology Office Visits:  Recall 05/15/2020 with Dr Lovena Le.  Recall 10/27/2020 with Dr Sallyanne Kuster.   Copy of ICM check sent to Dr.Taylor  3 month ICM trend: 08/30/2020    1 Year ICM trend:       [name], RN 08/31/2020 2:36 PM

## 2020-09-01 DIAGNOSIS — I5043 Acute on chronic combined systolic (congestive) and diastolic (congestive) heart failure: Secondary | ICD-10-CM | POA: Diagnosis not present

## 2020-09-01 DIAGNOSIS — I130 Hypertensive heart and chronic kidney disease with heart failure and stage 1 through stage 4 chronic kidney disease, or unspecified chronic kidney disease: Secondary | ICD-10-CM | POA: Diagnosis not present

## 2020-09-01 DIAGNOSIS — I4819 Other persistent atrial fibrillation: Secondary | ICD-10-CM | POA: Diagnosis not present

## 2020-09-01 DIAGNOSIS — N184 Chronic kidney disease, stage 4 (severe): Secondary | ICD-10-CM | POA: Diagnosis not present

## 2020-09-01 DIAGNOSIS — E1122 Type 2 diabetes mellitus with diabetic chronic kidney disease: Secondary | ICD-10-CM | POA: Diagnosis not present

## 2020-09-01 DIAGNOSIS — N179 Acute kidney failure, unspecified: Secondary | ICD-10-CM | POA: Diagnosis not present

## 2020-09-02 ENCOUNTER — Ambulatory Visit (INDEPENDENT_AMBULATORY_CARE_PROVIDER_SITE_OTHER): Payer: Medicare Other | Admitting: *Deleted

## 2020-09-02 ENCOUNTER — Telehealth: Payer: Self-pay | Admitting: *Deleted

## 2020-09-02 DIAGNOSIS — E1122 Type 2 diabetes mellitus with diabetic chronic kidney disease: Secondary | ICD-10-CM | POA: Diagnosis not present

## 2020-09-02 DIAGNOSIS — Z7901 Long term (current) use of anticoagulants: Secondary | ICD-10-CM

## 2020-09-02 DIAGNOSIS — I4819 Other persistent atrial fibrillation: Secondary | ICD-10-CM | POA: Diagnosis not present

## 2020-09-02 DIAGNOSIS — I5043 Acute on chronic combined systolic (congestive) and diastolic (congestive) heart failure: Secondary | ICD-10-CM | POA: Diagnosis not present

## 2020-09-02 DIAGNOSIS — Z5181 Encounter for therapeutic drug level monitoring: Secondary | ICD-10-CM

## 2020-09-02 DIAGNOSIS — I130 Hypertensive heart and chronic kidney disease with heart failure and stage 1 through stage 4 chronic kidney disease, or unspecified chronic kidney disease: Secondary | ICD-10-CM | POA: Diagnosis not present

## 2020-09-02 DIAGNOSIS — Z952 Presence of prosthetic heart valve: Secondary | ICD-10-CM

## 2020-09-02 DIAGNOSIS — N179 Acute kidney failure, unspecified: Secondary | ICD-10-CM | POA: Diagnosis not present

## 2020-09-02 DIAGNOSIS — N184 Chronic kidney disease, stage 4 (severe): Secondary | ICD-10-CM | POA: Diagnosis not present

## 2020-09-02 LAB — POCT INR: INR: 2.7 (ref 2.0–3.0)

## 2020-09-02 NOTE — Patient Instructions (Signed)
Spoke with [facility].  Order given to continue coumadin 2 tablets daily except 1 tablet on Saturdays  Continue greens Recheck INR on Thursday 09/09/20

## 2020-09-02 NOTE — Telephone Encounter (Signed)
INR 2.7 PT 32.5  Per Janett Billow w/ [place]

## 2020-09-02 NOTE — Telephone Encounter (Signed)
Order given to [facility].  See coumadin note.

## 2020-09-03 DIAGNOSIS — N179 Acute kidney failure, unspecified: Secondary | ICD-10-CM | POA: Diagnosis not present

## 2020-09-03 DIAGNOSIS — I130 Hypertensive heart and chronic kidney disease with heart failure and stage 1 through stage 4 chronic kidney disease, or unspecified chronic kidney disease: Secondary | ICD-10-CM | POA: Diagnosis not present

## 2020-09-03 DIAGNOSIS — N184 Chronic kidney disease, stage 4 (severe): Secondary | ICD-10-CM | POA: Diagnosis not present

## 2020-09-03 DIAGNOSIS — L851 Acquired keratosis [keratoderma] palmaris et plantaris: Secondary | ICD-10-CM | POA: Diagnosis not present

## 2020-09-03 DIAGNOSIS — I5043 Acute on chronic combined systolic (congestive) and diastolic (congestive) heart failure: Secondary | ICD-10-CM | POA: Diagnosis not present

## 2020-09-03 DIAGNOSIS — E1142 Type 2 diabetes mellitus with diabetic polyneuropathy: Secondary | ICD-10-CM | POA: Diagnosis not present

## 2020-09-03 DIAGNOSIS — E1122 Type 2 diabetes mellitus with diabetic chronic kidney disease: Secondary | ICD-10-CM | POA: Diagnosis not present

## 2020-09-03 DIAGNOSIS — L603 Nail dystrophy: Secondary | ICD-10-CM | POA: Diagnosis not present

## 2020-09-03 DIAGNOSIS — I4819 Other persistent atrial fibrillation: Secondary | ICD-10-CM | POA: Diagnosis not present

## 2020-09-07 ENCOUNTER — Telehealth: Payer: Self-pay

## 2020-09-07 ENCOUNTER — Other Ambulatory Visit: Payer: Self-pay | Admitting: Adult Health

## 2020-09-07 DIAGNOSIS — I5043 Acute on chronic combined systolic (congestive) and diastolic (congestive) heart failure: Secondary | ICD-10-CM | POA: Diagnosis not present

## 2020-09-07 DIAGNOSIS — I130 Hypertensive heart and chronic kidney disease with heart failure and stage 1 through stage 4 chronic kidney disease, or unspecified chronic kidney disease: Secondary | ICD-10-CM | POA: Diagnosis not present

## 2020-09-07 DIAGNOSIS — E1122 Type 2 diabetes mellitus with diabetic chronic kidney disease: Secondary | ICD-10-CM | POA: Diagnosis not present

## 2020-09-07 DIAGNOSIS — N184 Chronic kidney disease, stage 4 (severe): Secondary | ICD-10-CM | POA: Diagnosis not present

## 2020-09-07 DIAGNOSIS — I4819 Other persistent atrial fibrillation: Secondary | ICD-10-CM | POA: Diagnosis not present

## 2020-09-07 DIAGNOSIS — N179 Acute kidney failure, unspecified: Secondary | ICD-10-CM | POA: Diagnosis not present

## 2020-09-07 NOTE — Telephone Encounter (Signed)
Fernand Parkins w/ [facility] called asking for verbal orders. Patient requesting OT come out so she can work on upper body strength. Returned call and gave her the verbal ok.

## 2020-09-07 NOTE — Telephone Encounter (Signed)
Thank you very much 

## 2020-09-08 ENCOUNTER — Other Ambulatory Visit: Payer: Self-pay | Admitting: Family Medicine

## 2020-09-08 DIAGNOSIS — I5043 Acute on chronic combined systolic (congestive) and diastolic (congestive) heart failure: Secondary | ICD-10-CM | POA: Diagnosis not present

## 2020-09-08 DIAGNOSIS — I4819 Other persistent atrial fibrillation: Secondary | ICD-10-CM | POA: Diagnosis not present

## 2020-09-08 DIAGNOSIS — I950 Idiopathic hypotension: Secondary | ICD-10-CM | POA: Diagnosis not present

## 2020-09-08 DIAGNOSIS — I429 Cardiomyopathy, unspecified: Secondary | ICD-10-CM | POA: Diagnosis not present

## 2020-09-08 DIAGNOSIS — M47816 Spondylosis without myelopathy or radiculopathy, lumbar region: Secondary | ICD-10-CM

## 2020-09-08 DIAGNOSIS — J449 Chronic obstructive pulmonary disease, unspecified: Secondary | ICD-10-CM | POA: Diagnosis not present

## 2020-09-08 DIAGNOSIS — M4310 Spondylolisthesis, site unspecified: Secondary | ICD-10-CM

## 2020-09-08 DIAGNOSIS — R569 Unspecified convulsions: Secondary | ICD-10-CM | POA: Diagnosis not present

## 2020-09-08 DIAGNOSIS — J9621 Acute and chronic respiratory failure with hypoxia: Secondary | ICD-10-CM | POA: Diagnosis not present

## 2020-09-08 DIAGNOSIS — N184 Chronic kidney disease, stage 4 (severe): Secondary | ICD-10-CM | POA: Diagnosis not present

## 2020-09-08 DIAGNOSIS — M48061 Spinal stenosis, lumbar region without neurogenic claudication: Secondary | ICD-10-CM

## 2020-09-08 DIAGNOSIS — N179 Acute kidney failure, unspecified: Secondary | ICD-10-CM | POA: Diagnosis not present

## 2020-09-08 DIAGNOSIS — E890 Postprocedural hypothyroidism: Secondary | ICD-10-CM

## 2020-09-08 DIAGNOSIS — E1122 Type 2 diabetes mellitus with diabetic chronic kidney disease: Secondary | ICD-10-CM | POA: Diagnosis not present

## 2020-09-08 DIAGNOSIS — E11649 Type 2 diabetes mellitus with hypoglycemia without coma: Secondary | ICD-10-CM | POA: Diagnosis not present

## 2020-09-08 DIAGNOSIS — I130 Hypertensive heart and chronic kidney disease with heart failure and stage 1 through stage 4 chronic kidney disease, or unspecified chronic kidney disease: Secondary | ICD-10-CM | POA: Diagnosis not present

## 2020-09-09 ENCOUNTER — Ambulatory Visit (INDEPENDENT_AMBULATORY_CARE_PROVIDER_SITE_OTHER): Payer: Medicare Other | Admitting: *Deleted

## 2020-09-09 ENCOUNTER — Telehealth: Payer: Self-pay | Admitting: *Deleted

## 2020-09-09 DIAGNOSIS — Z952 Presence of prosthetic heart valve: Secondary | ICD-10-CM

## 2020-09-09 DIAGNOSIS — I5043 Acute on chronic combined systolic (congestive) and diastolic (congestive) heart failure: Secondary | ICD-10-CM | POA: Diagnosis not present

## 2020-09-09 DIAGNOSIS — N179 Acute kidney failure, unspecified: Secondary | ICD-10-CM | POA: Diagnosis not present

## 2020-09-09 DIAGNOSIS — Z5181 Encounter for therapeutic drug level monitoring: Secondary | ICD-10-CM

## 2020-09-09 DIAGNOSIS — E1122 Type 2 diabetes mellitus with diabetic chronic kidney disease: Secondary | ICD-10-CM | POA: Diagnosis not present

## 2020-09-09 DIAGNOSIS — I130 Hypertensive heart and chronic kidney disease with heart failure and stage 1 through stage 4 chronic kidney disease, or unspecified chronic kidney disease: Secondary | ICD-10-CM | POA: Diagnosis not present

## 2020-09-09 DIAGNOSIS — N184 Chronic kidney disease, stage 4 (severe): Secondary | ICD-10-CM | POA: Diagnosis not present

## 2020-09-09 DIAGNOSIS — I4819 Other persistent atrial fibrillation: Secondary | ICD-10-CM | POA: Diagnosis not present

## 2020-09-09 LAB — POCT INR: INR: 3.9 — AB (ref 2.0–3.0)

## 2020-09-09 NOTE — Telephone Encounter (Signed)
Done.  See coumadin note. 

## 2020-09-09 NOTE — Telephone Encounter (Signed)
Per phone call from Precious Haws w/ [facility]   INR 4.1 PT 48.6  She rechecked her and it was 3.9 and 47.3  Please call Steffanie Dunn [phone]

## 2020-09-09 NOTE — Patient Instructions (Signed)
Spoke with [facility] [place].  Order given to hold warfarin tonight then decrease dose to 2 tablets daily except 1 tablet on Tuesdays and Saturdays  Continue greens Recheck INR in 1 wk

## 2020-09-10 ENCOUNTER — Emergency Department (HOSPITAL_COMMUNITY)
Admission: EM | Admit: 2020-09-10 | Discharge: 2020-09-10 | Disposition: A | Discharge status: Home / Self Care | Payer: Medicare Other | Attending: Emergency Medicine | Admitting: Emergency Medicine

## 2020-09-10 ENCOUNTER — Encounter (HOSPITAL_COMMUNITY): Payer: Self-pay

## 2020-09-10 ENCOUNTER — Emergency Department (HOSPITAL_COMMUNITY): Payer: Medicare Other

## 2020-09-10 DIAGNOSIS — I4819 Other persistent atrial fibrillation: Secondary | ICD-10-CM | POA: Diagnosis not present

## 2020-09-10 DIAGNOSIS — Z87891 Personal history of nicotine dependence: Secondary | ICD-10-CM | POA: Diagnosis not present

## 2020-09-10 DIAGNOSIS — J439 Emphysema, unspecified: Secondary | ICD-10-CM | POA: Diagnosis not present

## 2020-09-10 DIAGNOSIS — Z79890 Hormone replacement therapy (postmenopausal): Secondary | ICD-10-CM | POA: Diagnosis not present

## 2020-09-10 DIAGNOSIS — Z85850 Personal history of malignant neoplasm of thyroid: Secondary | ICD-10-CM | POA: Insufficient documentation

## 2020-09-10 DIAGNOSIS — E039 Hypothyroidism, unspecified: Secondary | ICD-10-CM | POA: Insufficient documentation

## 2020-09-10 DIAGNOSIS — N179 Acute kidney failure, unspecified: Secondary | ICD-10-CM | POA: Diagnosis not present

## 2020-09-10 DIAGNOSIS — J441 Chronic obstructive pulmonary disease with (acute) exacerbation: Secondary | ICD-10-CM | POA: Diagnosis not present

## 2020-09-10 DIAGNOSIS — E875 Hyperkalemia: Secondary | ICD-10-CM | POA: Diagnosis not present

## 2020-09-10 DIAGNOSIS — E1122 Type 2 diabetes mellitus with diabetic chronic kidney disease: Secondary | ICD-10-CM | POA: Insufficient documentation

## 2020-09-10 DIAGNOSIS — I130 Hypertensive heart and chronic kidney disease with heart failure and stage 1 through stage 4 chronic kidney disease, or unspecified chronic kidney disease: Secondary | ICD-10-CM | POA: Insufficient documentation

## 2020-09-10 DIAGNOSIS — Z79899 Other long term (current) drug therapy: Secondary | ICD-10-CM | POA: Insufficient documentation

## 2020-09-10 DIAGNOSIS — I5043 Acute on chronic combined systolic (congestive) and diastolic (congestive) heart failure: Secondary | ICD-10-CM | POA: Diagnosis not present

## 2020-09-10 DIAGNOSIS — Z7901 Long term (current) use of anticoagulants: Secondary | ICD-10-CM | POA: Diagnosis not present

## 2020-09-10 DIAGNOSIS — Z954 Presence of other heart-valve replacement: Secondary | ICD-10-CM | POA: Diagnosis not present

## 2020-09-10 DIAGNOSIS — N184 Chronic kidney disease, stage 4 (severe): Secondary | ICD-10-CM | POA: Diagnosis not present

## 2020-09-10 DIAGNOSIS — N1832 Chronic kidney disease, stage 3b: Secondary | ICD-10-CM | POA: Diagnosis not present

## 2020-09-10 DIAGNOSIS — I5033 Acute on chronic diastolic (congestive) heart failure: Secondary | ICD-10-CM | POA: Diagnosis not present

## 2020-09-10 DIAGNOSIS — I517 Cardiomegaly: Secondary | ICD-10-CM | POA: Diagnosis not present

## 2020-09-10 DIAGNOSIS — R059 Cough, unspecified: Secondary | ICD-10-CM | POA: Diagnosis not present

## 2020-09-10 DIAGNOSIS — Z20822 Contact with and (suspected) exposure to covid-19: Secondary | ICD-10-CM | POA: Diagnosis not present

## 2020-09-10 DIAGNOSIS — J90 Pleural effusion, not elsewhere classified: Secondary | ICD-10-CM | POA: Diagnosis not present

## 2020-09-10 DIAGNOSIS — Z950 Presence of cardiac pacemaker: Secondary | ICD-10-CM | POA: Insufficient documentation

## 2020-09-10 LAB — CBC
HCT: 28 % — ABNORMAL LOW (ref 36.0–46.0)
Hemoglobin: 8.4 g/dL — ABNORMAL LOW (ref 12.0–15.0)
MCH: 29.1 pg (ref 26.0–34.0)
MCHC: 30 g/dL (ref 30.0–36.0)
MCV: 96.9 fL (ref 80.0–100.0)
Platelets: 181 10*3/uL (ref 150–400)
RBC: 2.89 MIL/uL — ABNORMAL LOW (ref 3.87–5.11)
RDW: 16.9 % — ABNORMAL HIGH (ref 11.5–15.5)
WBC: 5.5 10*3/uL (ref 4.0–10.5)
nRBC: 0 % (ref 0.0–0.2)

## 2020-09-10 LAB — BASIC METABOLIC PANEL
Anion gap: 7 (ref 5–15)
BUN: 51 mg/dL — ABNORMAL HIGH (ref 8–23)
CO2: 28 mmol/L (ref 22–32)
Calcium: 8.8 mg/dL — ABNORMAL LOW (ref 8.9–10.3)
Chloride: 105 mmol/L (ref 98–111)
Creatinine, Ser: 1.46 mg/dL — ABNORMAL HIGH (ref 0.44–1.00)
GFR, Estimated: 35 mL/min — ABNORMAL LOW (ref >60)
Glucose, Bld: 90 mg/dL (ref 70–99)
Potassium: 5.4 mmol/L — ABNORMAL HIGH (ref 3.5–5.1)
Sodium: 140 mmol/L (ref 135–145)

## 2020-09-10 LAB — RESP PANEL BY RT-PCR (FLU A&B, COVID) ARPGX2
Influenza A by PCR: NEGATIVE
Influenza B by PCR: NEGATIVE
SARS Coronavirus 2 by RT PCR: NEGATIVE

## 2020-09-10 MED ORDER — PREDNISONE 20 MG PO TABS: 20.0000 mg | ORAL_TABLET | Freq: Two times a day (BID) | ORAL | 0 refills | Status: DC

## 2020-09-10 MED ORDER — PREDNISONE 50 MG PO TABS
60.0000 mg | ORAL_TABLET | Freq: Once | ORAL | Status: AC
  Administered 2020-09-10: 60 mg via ORAL
  Filled 2020-09-10: qty 1

## 2020-09-10 MED ORDER — DOXYCYCLINE HYCLATE 100 MG PO CAPS: 100.0000 mg | ORAL_CAPSULE | Freq: Two times a day (BID) | ORAL | 0 refills | Status: DC

## 2020-09-10 MED ORDER — ALBUTEROL SULFATE (2.5 MG/3ML) 0.083% IN NEBU
5.0000 mg | INHALATION_SOLUTION | Freq: Once | RESPIRATORY_TRACT | Status: AC
  Administered 2020-09-10: 5 mg via RESPIRATORY_TRACT
  Filled 2020-09-10: qty 6

## 2020-09-10 NOTE — ED Provider Notes (Signed)
[facility] Provider Note   CSN: [id number] Arrival date & time: 09/10/20  1443     History Chief Complaint  Patient presents with  . Cough    [name] [name] is a 86 y.o. female.  HPI She complains of congestion in her nose and chest for 2 days, and coughing up white-colored sputum.  She denies shortness of breath, chest pain, weakness or dizziness.  She is taking her usual medications without relief.  She denies nausea, vomiting, diarrhea, headache or back pain.  There are no other known modifying factors.    Past Medical History:  Diagnosis Date  . Acute on chronic diastolic CHF (congestive heart failure) ([place]) 05/19/2012  . Acute on chronic respiratory failure with hypoxia ([place]) 01/31/2020  . Adenomatous polyp of colon 12/16/2002   Dr. [name]/[facility]  . Allergy   . Calculus of gallbladder with chronic cholecystitis without obstruction 02/09/2009   Qualifier: Diagnosis of  By: [name], [name]    . Cataract   . CHB (complete heart block) ([place]) 10/07/2014      . CHF (congestive heart failure) ([place])    a.  EF previously reduced to 25-30% b. EF improved to 60-65% by echo in 10/2017.   Marland Kitchen Chronic gout due to renal impairment    Per [facility] Records [facility]   . Chronic kidney disease, stage I 2006  . Cognitive communication deficit    Per [facility]   . Constipation       . Convulsion ([place])    Per [facility]   . COPD (chronic obstructive pulmonary disease) ([place])       . DJD (degenerative joint disease) of lumbar spine   . DM (diabetes mellitus) ([place]) 2006  . Dysphagia, unspecified    Per [facility] Records [facility]   . Esophageal reflux   . Glaucoma   . Gout       . H/O adenomatous polyp of colon 05/24/2012   2004 in [place]  colonoscopy 2008 normal    . H/O heart valve replacement with mechanical valve    a. s/p mechanical AVR in 2001 and mechanical MVR in 2004.  Marland Kitchen H/O wisdom tooth extraction   . HIP PAIN, RIGHT 04/23/2009   Qualifier: Diagnosis of  By: [name], [name]    . Hyperkalemia    Per [facility]   . Hyperlipemia   . Hypertensive heart and kidney disease with HF and with CKD stage I-IV ([place])    Per [facility]   . IDA (iron deficiency anemia)    Parenteral iron/[name]  . Insomnia       . Localized edema    Left arm, Per [facility] Records [facility]   . Long term (current) use of anticoagulants    Per [facility]   . Nausea without vomiting 06/07/2010   Qualifier: Diagnosis of  By: [name], [name]    . Non-ischemic cardiomyopathy ([place])    a. s/p MDT CRTD  . Obesity, unspecified   . Oxygen deficiency 2014   nocturnal;  . Papilloma of breast 03/06/2012   This was diagnosed as a left breast papilloma by ultrasound characteristics and symptoms in 2010. Because of possible medical risk  factors no surgical intervention has been done and it did doing annual followups. The area has been stable by physical examination and mammograms and ultrasound since 2010.   . Presence of cardiac pacemaker    Per [facility] Records [facility]   . Spinal stenosis of lumbar region 02/19/2013  . Spondylolisthesis   . Spondylosis   . Thyroid cancer ([place])    remote thyroidectomy, no recurrence, pt denies in 2016 thyroid cancer  . Type 2 diabetes mellitus with hyperglycemia ([place]) 07/15/2010  . Type 2 diabetes mellitus with hypoglycemia without coma ([place])    Per [facility]   . Unspecified chronic bronchitis ([place])    Per [facility]   . Unspecified hypothyroidism       . Urinary tract infection, site not specified    Per [facility] Records [facility]   . 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 Vertigo         Patient Active Problem List   Diagnosis Date Noted  . Gait abnormality 08/24/2020  . Chronic constipation 08/09/2020  . Acute UTI 08/03/2020  . Hypertensive heart and kidney disease with chronic systolic congestive heart failure and stage 4 chronic kidney disease ([place]) 07/15/2020  . Hyperlipidemia associated with type 2 diabetes mellitus ([place]) 07/15/2020  . Seizure ([place]) 07/07/2020  . Goals of care, counseling/discussion   . Palliative care by specialist   . Idiopathic hypotension   . CHF exacerbation ([place]) 06/29/2020  . Arthritis of right knee 06/20/2020  . Leg swelling 06/16/2020  . Generalized osteoarthritis 05/09/2020  . Primary osteoarthritis, left shoulder 04/21/2020  . Acute systolic CHF (congestive heart failure) ([place]) 03/31/2020  . Bilateral lower extremity edema   . Acute on chronic congestive heart failure ([place]) 03/30/2020  . Right-sided epistaxis 03/28/2020  . Acute pain of left shoulder 01/15/2020  . AAA (abdominal aortic aneurysm) without rupture ([place]) 01/14/2020  . COVID-19 virus detected 11/23/2019  . Stage 3b chronic kidney disease ([place]) 08/04/2019  . Dyspnea 05/30/2019  . Anemia 05/30/2019  . Gout   . Glaucoma   . GERD (gastroesophageal reflux disease)   . Physical debility 04/28/2019  . Encounter for examination following treatment at hospital 11/03/2018  . Hyponatremia 09/27/2018  . H/O mitral valve replacement with mechanical valve 08/02/2018  . Chronic right hip pain 04/16/2018  . Chronic systolic congestive heart failure ([place]) 11/15/2017  . Trochanteric bursitis, right hip 03/07/2017  . Persistent atrial fibrillation ([place])   . Nocturnal hypoxia 10/08/2016  .  Dependence on nocturnal oxygen therapy 10/08/2016  . Hematuria 03/20/2016  . S/P ICD (internal cardiac defibrillator) procedure 02/08/2015  . Presence of cardiac pacemaker 02/08/2015  . CHB (complete heart block) ([place]) 10/07/2014  . Fatigue 07/20/2014  . Absolute anemia 06/24/2014  . Osteopenia 02/10/2014  . Encounter for therapeutic drug monitoring 10/29/2013  . OA (osteoarthritis) of knee 02/19/2013  . Chronic pain of right knee 01/02/2013  . Hemolytic anemia ([place]) 09/24/2012  . High risk medication use 05/24/2012  . COPD (chronic obstructive pulmonary disease) ([place]) 05/19/2012  . Chronic anticoagulation, on coumadin for Mechanical AVR and MVR 04/01/2012  . Cardiorenal syndrome with renal failure 03/22/2012  . Hx of aortic valve replacement, mechanical 03/22/2012  . S/P MVR (mitral valve replacement) 03/22/2012  . Biventricular automatic implantable cardioverter defibrillator in situ 03/22/2012  . CKD (chronic kidney disease), stage IV ([place])  03/22/2012  . Warfarin-induced coagulopathy ([place]) 03/21/2012  . Iron deficiency 10/04/2011  . Allergic rhinitis 02/14/2011  . Mitral valve disease 07/15/2010  . Sinus node dysfunction ([place]) 07/15/2010  . Type 2 diabetes mellitus with hypoglycemia without coma ([place]) 07/15/2010  . VARICOSE VEINS LOWER EXTREMITIES W/OTH COMPS 02/09/2009  . Prediabetes 06/15/2008  . Hypothyroidism, postsurgical 02/24/2008  . Hyperlipidemia LDL goal <70 02/24/2008  . Essential hypertension 02/24/2008  . Non-ischemic cardiomyopathy with ICD 02/24/2008  . Spondylosis 02/24/2008    Past Surgical History:  Procedure Laterality Date  . AORTIC AND MITRAL VALVE REPLACEMENT  2001  . BI-VENTRICULAR IMPLANTABLE CARDIOVERTER DEFIBRILLATOR UPGRADE N/A 01/18/2015   Procedure: BI-VENTRICULAR IMPLANTABLE CARDIOVERTER DEFIBRILLATOR UPGRADE;  Surgeon: Evans Lance, MD;  Location: [facility] CATH LAB;  Service: Cardiovascular;  Laterality: N/A;  . BIV ICD GENERATOR CHANGEOUT N/A  01/30/2019   Procedure: BIV ICD GENERATOR CHANGEOUT;  Surgeon: Evans Lance, MD;  Location: [facility] CV LAB;  Service: Cardiovascular;  Laterality: N/A;  . CARDIAC CATHETERIZATION    . CARDIAC VALVE REPLACEMENT    . CARDIOVERSION N/A 11/13/2016   Procedure: CARDIOVERSION;  Surgeon: Sanda Klein, MD;  Location: [facility] ENDOSCOPY;  Service: Cardiovascular;  Laterality: N/A;  . COLONOSCOPY  06/12/2007   Rourk- Normal rectum/Normal colon  . COLONOSCOPY  12/16/02   small hemorrhoids  . COLONOSCOPY  06/20/2012   Procedure: COLONOSCOPY;  Surgeon: Daneil Dolin, MD;  Location: [facility] ENDO SUITE;  Service: Endoscopy;  Laterality: N/A;  10:45  . defibrillator implanted 2006    . DOPPLER ECHOCARDIOGRAPHY  2012  . DOPPLER ECHOCARDIOGRAPHY  05,06,07,08,09,2011  . ESOPHAGOGASTRODUODENOSCOPY  06/12/2007   Rourk- normal esophagus, small hiatal hernia, otherwise normal stomach, D1, D2  . EYE SURGERY Right 2005  . EYE SURGERY Left 2006  . ICD LEAD REMOVAL Left 02/08/2015   Procedure: ICD LEAD REMOVAL;  Surgeon: Evans Lance, MD;  Location: [facility] OR;  Service: Cardiovascular;  Laterality: Left;  "Will plan extraction and insertion of a BiV PM"  **Dr. Roxan Hockey backing up case**  . IMPLANTABLE CARDIOVERTER DEFIBRILLATOR (ICD) GENERATOR CHANGE Left 02/08/2015   Procedure: ICD GENERATOR CHANGE;  Surgeon: Evans Lance, MD;  Location: [facility];  Service: Cardiovascular;  Laterality: Left;  . INSERT / REPLACE / REMOVE PACEMAKER    . PACEMAKER INSERTION  May 2016  . pacemaker placed  2004  . right breast cyst removed     benign   . right cataract removed     2005  . TEE WITHOUT CARDIOVERSION N/A 11/13/2016   Procedure: TRANSESOPHAGEAL ECHOCARDIOGRAM (TEE);  Surgeon: Sanda Klein, MD;  Location: [facility] ENDOSCOPY;  Service: Cardiovascular;  Laterality: N/A;  . THYROIDECTOMY       OB History    Gravida      Para      Term      Preterm      AB      Living  0     SAB      IAB      Ectopic       Multiple      Live Births              Family History  Problem Relation Age of Onset  . Pancreatic cancer Mother   . Heart disease Father   . Heart disease Sister   . Heart attack Sister   . Heart disease Brother   . Diabetes Brother   . Heart disease Brother   . Diabetes Brother   .  Hypertension Brother   . Lung cancer Brother     Social History   Tobacco Use  . Smoking status: Former Smoker    Packs/day: 0.75    Years: 40.00    Pack years: 30.00    Types: Cigarettes    Quit date: 03/08/1987    Years since quitting: 33.5  . Smokeless tobacco: Never Used  Vaping Use  . Vaping Use: Never used  Substance Use Topics  . Alcohol use: No    Alcohol/week: 0.0 standard drinks  . Drug use: No    Home Medications Prior to Admission medications   Medication Sig Start Date End Date Taking? Authorizing Provider  acetaminophen (TYLENOL) 500 MG tablet Take 1 tablet (500 mg total) by mouth every 6 (six) hours as needed (pain). 07/08/20   Oretha Milch D, MD  albuterol (PROVENTIL) (2.5 MG/3ML) 0.083% nebulizer solution Take 3 mLs (2.5 mg total) by nebulization in the morning and at bedtime. *May use additional one time if needed for shortness of breath 08/11/20   Gerlene Fee, NP  albuterol (VENTOLIN HFA) 108 (90 Base) MCG/ACT inhaler Inhale 1 puff into the lungs every 6 (six) hours as needed for wheezing or shortness of breath. 08/11/20   Gerlene Fee, NP  allopurinol (ZYLOPRIM) 100 MG tablet TAKE ONE TABLET BY MOUTH ONCE DAILY. 09/08/20   Fayrene Helper, MD  BREO ELLIPTA 100-25 MCG/INH AEPB INHALE (1) PUFF BY MOUTH INTO THE LUNGS DAILY 09/08/20   Fayrene Helper, MD  cholecalciferol (VITAMIN D3) 25 MCG (1000 UNIT) tablet Take 1 tablet (1,000 Units total) by mouth in the morning. 07/08/20   Desiree Hane, MD  fluticasone (FLONASE) 50 MCG/ACT nasal spray Place 2 sprays into both nostrils daily. 07/08/20   Desiree Hane, MD  folic acid (FOLVITE) 1 MG tablet  TAKE ONE TABLET BY MOUTH ONCE DAILY. 09/08/20   Fayrene Helper, MD  furosemide (LASIX) 40 MG tablet TAKE ONE TABLET BY MOUTH ONCE DAILY. 09/08/20   Fayrene Helper, MD  gabapentin (NEURONTIN) 300 MG capsule TAKE (1) CAPSULE BY MOUTH TWICE DAILY 09/08/20   Fayrene Helper, MD  levETIRAcetam (KEPPRA) 500 MG tablet TAKE (1) TABLET BY MOUTH TWICE DAILY AT 9AM AND 9PM 09/08/20   Fayrene Helper, MD  [facility] PAIN RELIEF PATCH EX) Apply 1 patch topically daily as needed (pain).     [provider]  metoprolol tartrate (LOPRESSOR) 50 MG tablet TAKE (1) TABLET BY MOUTH TWICE DAILY AT 9AM AND 9PM. 09/08/20   Fayrene Helper, MD  nitroGLYCERIN (NITROSTAT) 0.4 MG SL tablet DISSOLVE 1 TABLET SUBLINGUALLY AS NEEDED FOR CHEST PAIN, MAY REPEAT EVERY 5 MINUTES. AFTER 3 CALL 911. 09/08/20   Fayrene Helper, MD  NON FORMULARY Diet Change: Soft diet with dys 2 (ground) meats, thin liquids (NAS, cons CHO, heart healthy) 07/26/20   [provider]  ondansetron (ZOFRAN-ODT) 4 MG disintegrating tablet TAKE (1) TABLET BY MOUTH EVERY EIGHT HOURS AS NEEDED FOR NAUSEA OR VOMITING 09/08/20   Fayrene Helper, MD  OXYGEN Inhale 3 L into the lungs continuous. 07/08/20   [provider]  Polyethyl [name] (SYSTANE) 0.4-0.3 % SOLN Apply 1 drop to eye at bedtime. Left eye    [provider]  polyethylene glycol (MIRALAX / GLYCOLAX) 17 g packet Take 17 g by mouth daily as needed. 07/08/20   Desiree Hane, MD  potassium chloride SA (KLOR-CON) 20 MEQ tablet TAKE ONE TABLET BY MOUTH ONCE  DAILY. 09/08/20   Fayrene Helper, MD  pravastatin (PRAVACHOL) 40 MG tablet TAKE ONE TABLET BY MOUTH ONCE DAILY. 09/08/20   Fayrene Helper, MD  senna (SENOKOT) 8.6 MG TABS tablet Take 2 tablets (17.2 mg total) by mouth daily as needed for mild constipation. 07/08/20   Desiree Hane, MD  SYNTHROID 88 MCG tablet TAKE 1 TABLET BEFORE BREAKFAST. 09/08/20    Fayrene Helper, MD  warfarin (COUMADIN) 10 MG tablet TAKE (1) TABLET BY MOUTH DAILY AT 4PM 09/08/20   Fayrene Helper, MD    Allergies    Penicillins, Aspirin, Fentanyl, and Niacin  Review of Systems   Review of Systems  All other systems reviewed and are negative.   Physical Exam Updated Vital Signs BP 132/63 (BP Location: Right Arm)   Pulse (!) 58   Temp 97.9 F (36.6 C) (Oral)   Resp 18   Ht 5\' 7"  (1.702 m)   Wt 87.5 kg   SpO2 98%   BMI 30.23 kg/m   Physical Exam Vitals and nursing note reviewed.  Constitutional:      General: She is in acute distress (Coughing).     Appearance: She is well-developed and well-nourished. She is not ill-appearing, toxic-appearing or diaphoretic.  HENT:     Head: Normocephalic and atraumatic.     Right Ear: External ear normal.     Left Ear: External ear normal.     Mouth/Throat:     Mouth: Mucous membranes are moist.     Pharynx: No oropharyngeal exudate or posterior oropharyngeal erythema.  Eyes:     Extraocular Movements: EOM normal.     Conjunctiva/sclera: Conjunctivae normal.     Pupils: Pupils are equal, round, and reactive to light.  Neck:     Trachea: Phonation normal.  Cardiovascular:     Rate and Rhythm: Normal rate and regular rhythm.     Heart sounds: Normal heart sounds.  Pulmonary:     Effort: Pulmonary effort is normal. No respiratory distress.     Breath sounds: No stridor. No wheezing.     Comments: Decreased breath sounds, bilaterally, with few scattered rhonchi. Chest:     Chest wall: No bony tenderness.  Abdominal:     Palpations: Abdomen is soft.     Tenderness: There is no abdominal tenderness.  Musculoskeletal:        General: Normal range of motion.     Cervical back: Normal range of motion and neck supple.     Right lower leg: Edema present.     Left lower leg: Edema present.     Comments: 2-3+ pitting edema lower legs bilaterally.  Skin:    General: Skin is warm, dry and intact.   Neurological:     Mental Status: She is alert and oriented to person, place, and time.     Cranial Nerves: No cranial nerve deficit.     Sensory: No sensory deficit.     Motor: No abnormal muscle tone.     Coordination: Coordination normal.  Psychiatric:        Mood and Affect: Mood and affect and mood normal.        Behavior: Behavior normal.        Thought Content: Thought content normal.        Judgment: Judgment normal.     ED Results / Procedures / Treatments   Labs (all labs ordered are listed, but only abnormal results are displayed) Labs Reviewed  BASIC METABOLIC PANEL  CBC    EKG None  Radiology No results found.  Procedures Procedures (including critical care time)  Medications Ordered in ED Medications - No data to display  ED Course  I have reviewed the triage vital signs and the nursing notes.  Pertinent labs & imaging results that were available during my care of the patient were reviewed by me and considered in my medical decision making (see chart for details).    MDM Rules/Calculators/A&P                           Patient Vitals for the past 24 hrs:  BP Temp Temp src Pulse Resp SpO2 Height Weight  09/10/20 1630 (!) 147/69 -- -- (!) 59 (!) 26 98 % -- --  09/10/20 1600 (!) 148/88 -- -- 64 (!) 24 100 % -- --  09/10/20 1522 132/63 97.9 F (36.6 C) Oral (!) 58 18 98 % -- --  09/10/20 1521 -- -- -- -- -- -- 5\' 7"  (1.702 m) 87.5 kg    At time of discharge -reevaluation with update and discussion. After initial assessment and treatment, an updated evaluation reveals she is comfortable, sitting on edge of bed, in no apparent distress.  Findings discussed and questions answered. Daleen Bo   Medical Decision Making:  This patient is presenting for evaluation of congestion, which does require a range of treatment options, and is a complaint that involves a moderate risk of morbidity and mortality. The differential diagnoses include acute viral  process, acute bacterial infection, metabolic disorder. I decided to review old records, and in summary elderly female with multiple medical problems presenting with illness primarily congestion in nose and chest.  Nontoxic on evaluation..  I did not require additional historical information from anyone.  Clinical Laboratory Tests Ordered, included CBC, Metabolic panel and Covid and influenza testing. Review indicates normal except potassium high, BUN high, creatinine high, calcium low, GFR low, hemoglobin low. Radiologic Tests Ordered, included chest x-ray.  I independently Visualized: Radiologic images, which show no infiltrate or edema   Critical Interventions-clinical evaluation, laboratory testing, chest x-ray, observation and reassessment  After These Interventions, the Patient was reevaluated and was found stable for discharge.  Evaluation consistent with exacerbation of COPD.  Incidental mild potassium elevation, treated with albuterol nebulizer in the ED.  Patient had improvement in clinical status.  Laboratory abnormalities are unchanged from prior.  No indication for hospitalization at this time.  CRITICAL CARE-no Performed by: Daleen Bo  Nursing Notes Reviewed/ Care Coordinated Applicable Imaging Reviewed Interpretation of Laboratory Data incorporated into ED treatment  The patient appears reasonably screened and/or stabilized for discharge and I doubt any other medical condition or other [facility] requiring further screening, evaluation, or treatment in the ED at this time prior to discharge.  Plan: Home Medications-continue usual; Home Treatments- Rest, Fluids; return here if the recommended treatment, does not improve the symptoms; Recommended follow up-PCP check in 1 week and as needed     Final Clinical Impression(s) / ED Diagnoses Final diagnoses:  COPD exacerbation ([place])    Rx / DC Orders ED Discharge Orders    None       Daleen Bo, MD 09/11/20 1516

## 2020-09-10 NOTE — Discharge Instructions (Signed)
We sent prescriptions to your pharmacy to help your discomfort.  Make sure you are taking your usual medications as well.  See your doctor for checkup, next week.

## 2020-09-10 NOTE — ED Triage Notes (Signed)
Pt reports cough and congestion that started last night. Pt wears O2 at 3 L continuosly

## 2020-09-13 DIAGNOSIS — I429 Cardiomyopathy, unspecified: Secondary | ICD-10-CM | POA: Diagnosis not present

## 2020-09-13 DIAGNOSIS — I5043 Acute on chronic combined systolic (congestive) and diastolic (congestive) heart failure: Secondary | ICD-10-CM | POA: Diagnosis not present

## 2020-09-13 DIAGNOSIS — E1122 Type 2 diabetes mellitus with diabetic chronic kidney disease: Secondary | ICD-10-CM | POA: Diagnosis not present

## 2020-09-13 DIAGNOSIS — E669 Obesity, unspecified: Secondary | ICD-10-CM | POA: Diagnosis not present

## 2020-09-13 DIAGNOSIS — M48061 Spinal stenosis, lumbar region without neurogenic claudication: Secondary | ICD-10-CM | POA: Diagnosis not present

## 2020-09-13 DIAGNOSIS — M4310 Spondylolisthesis, site unspecified: Secondary | ICD-10-CM | POA: Diagnosis not present

## 2020-09-13 DIAGNOSIS — N179 Acute kidney failure, unspecified: Secondary | ICD-10-CM | POA: Diagnosis not present

## 2020-09-13 DIAGNOSIS — I950 Idiopathic hypotension: Secondary | ICD-10-CM | POA: Diagnosis not present

## 2020-09-13 DIAGNOSIS — G4700 Insomnia, unspecified: Secondary | ICD-10-CM | POA: Diagnosis not present

## 2020-09-13 DIAGNOSIS — K219 Gastro-esophageal reflux disease without esophagitis: Secondary | ICD-10-CM | POA: Diagnosis not present

## 2020-09-13 DIAGNOSIS — I130 Hypertensive heart and chronic kidney disease with heart failure and stage 1 through stage 4 chronic kidney disease, or unspecified chronic kidney disease: Secondary | ICD-10-CM | POA: Diagnosis not present

## 2020-09-13 DIAGNOSIS — J9621 Acute and chronic respiratory failure with hypoxia: Secondary | ICD-10-CM | POA: Diagnosis not present

## 2020-09-13 DIAGNOSIS — E11649 Type 2 diabetes mellitus with hypoglycemia without coma: Secondary | ICD-10-CM | POA: Diagnosis not present

## 2020-09-13 DIAGNOSIS — N184 Chronic kidney disease, stage 4 (severe): Secondary | ICD-10-CM | POA: Diagnosis not present

## 2020-09-13 DIAGNOSIS — I442 Atrioventricular block, complete: Secondary | ICD-10-CM | POA: Diagnosis not present

## 2020-09-13 DIAGNOSIS — R569 Unspecified convulsions: Secondary | ICD-10-CM | POA: Diagnosis not present

## 2020-09-13 DIAGNOSIS — J449 Chronic obstructive pulmonary disease, unspecified: Secondary | ICD-10-CM | POA: Diagnosis not present

## 2020-09-13 DIAGNOSIS — I8393 Asymptomatic varicose veins of bilateral lower extremities: Secondary | ICD-10-CM | POA: Diagnosis not present

## 2020-09-13 DIAGNOSIS — R1310 Dysphagia, unspecified: Secondary | ICD-10-CM | POA: Diagnosis not present

## 2020-09-13 DIAGNOSIS — M47816 Spondylosis without myelopathy or radiculopathy, lumbar region: Secondary | ICD-10-CM | POA: Diagnosis not present

## 2020-09-13 DIAGNOSIS — H409 Unspecified glaucoma: Secondary | ICD-10-CM | POA: Diagnosis not present

## 2020-09-13 DIAGNOSIS — I4819 Other persistent atrial fibrillation: Secondary | ICD-10-CM | POA: Diagnosis not present

## 2020-09-13 DIAGNOSIS — E890 Postprocedural hypothyroidism: Secondary | ICD-10-CM | POA: Diagnosis not present

## 2020-09-13 DIAGNOSIS — D509 Iron deficiency anemia, unspecified: Secondary | ICD-10-CM | POA: Diagnosis not present

## 2020-09-13 DIAGNOSIS — K5900 Constipation, unspecified: Secondary | ICD-10-CM | POA: Diagnosis not present

## 2020-09-14 DIAGNOSIS — N184 Chronic kidney disease, stage 4 (severe): Secondary | ICD-10-CM | POA: Diagnosis not present

## 2020-09-14 DIAGNOSIS — I5043 Acute on chronic combined systolic (congestive) and diastolic (congestive) heart failure: Secondary | ICD-10-CM | POA: Diagnosis not present

## 2020-09-14 DIAGNOSIS — I4819 Other persistent atrial fibrillation: Secondary | ICD-10-CM | POA: Diagnosis not present

## 2020-09-14 DIAGNOSIS — N179 Acute kidney failure, unspecified: Secondary | ICD-10-CM | POA: Diagnosis not present

## 2020-09-14 DIAGNOSIS — I130 Hypertensive heart and chronic kidney disease with heart failure and stage 1 through stage 4 chronic kidney disease, or unspecified chronic kidney disease: Secondary | ICD-10-CM | POA: Diagnosis not present

## 2020-09-14 DIAGNOSIS — E1122 Type 2 diabetes mellitus with diabetic chronic kidney disease: Secondary | ICD-10-CM | POA: Diagnosis not present

## 2020-09-15 ENCOUNTER — Other Ambulatory Visit: Payer: Self-pay

## 2020-09-15 DIAGNOSIS — M7989 Other specified soft tissue disorders: Secondary | ICD-10-CM

## 2020-09-15 MED ORDER — UNABLE TO FIND: 0 refills | Status: AC

## 2020-09-16 ENCOUNTER — Ambulatory Visit (INDEPENDENT_AMBULATORY_CARE_PROVIDER_SITE_OTHER): Payer: Medicare Other | Admitting: *Deleted

## 2020-09-16 ENCOUNTER — Other Ambulatory Visit: Payer: Self-pay

## 2020-09-16 DIAGNOSIS — Z5181 Encounter for therapeutic drug level monitoring: Secondary | ICD-10-CM

## 2020-09-16 DIAGNOSIS — Z952 Presence of prosthetic heart valve: Secondary | ICD-10-CM | POA: Diagnosis not present

## 2020-09-16 DIAGNOSIS — I5043 Acute on chronic combined systolic (congestive) and diastolic (congestive) heart failure: Secondary | ICD-10-CM | POA: Diagnosis not present

## 2020-09-16 DIAGNOSIS — N179 Acute kidney failure, unspecified: Secondary | ICD-10-CM | POA: Diagnosis not present

## 2020-09-16 DIAGNOSIS — E1122 Type 2 diabetes mellitus with diabetic chronic kidney disease: Secondary | ICD-10-CM | POA: Diagnosis not present

## 2020-09-16 DIAGNOSIS — I4819 Other persistent atrial fibrillation: Secondary | ICD-10-CM | POA: Diagnosis not present

## 2020-09-16 DIAGNOSIS — N184 Chronic kidney disease, stage 4 (severe): Secondary | ICD-10-CM | POA: Diagnosis not present

## 2020-09-16 DIAGNOSIS — I130 Hypertensive heart and chronic kidney disease with heart failure and stage 1 through stage 4 chronic kidney disease, or unspecified chronic kidney disease: Secondary | ICD-10-CM | POA: Diagnosis not present

## 2020-09-16 LAB — POCT INR: INR: 2.7 (ref 2.0–3.0)

## 2020-09-16 MED ORDER — ALBUTEROL SULFATE HFA 108 (90 BASE) MCG/ACT IN AERS: 1.0000 | INHALATION_SPRAY | Freq: Four times a day (QID) | RESPIRATORY_TRACT | 0 refills | Status: DC | PRN

## 2020-09-16 NOTE — Patient Instructions (Signed)
Spoke with [facility].  Order given to take warfarin 1 tablet tonight then resume 2 tablets daily except 1 tablet on Tuesdays and Saturdays  Continue greens Recheck INR in 1 wk

## 2020-09-21 DIAGNOSIS — N179 Acute kidney failure, unspecified: Secondary | ICD-10-CM | POA: Diagnosis not present

## 2020-09-21 DIAGNOSIS — I130 Hypertensive heart and chronic kidney disease with heart failure and stage 1 through stage 4 chronic kidney disease, or unspecified chronic kidney disease: Secondary | ICD-10-CM | POA: Diagnosis not present

## 2020-09-21 DIAGNOSIS — I4819 Other persistent atrial fibrillation: Secondary | ICD-10-CM | POA: Diagnosis not present

## 2020-09-21 DIAGNOSIS — I5043 Acute on chronic combined systolic (congestive) and diastolic (congestive) heart failure: Secondary | ICD-10-CM | POA: Diagnosis not present

## 2020-09-21 DIAGNOSIS — N184 Chronic kidney disease, stage 4 (severe): Secondary | ICD-10-CM | POA: Diagnosis not present

## 2020-09-21 DIAGNOSIS — E1122 Type 2 diabetes mellitus with diabetic chronic kidney disease: Secondary | ICD-10-CM | POA: Diagnosis not present

## 2020-09-22 ENCOUNTER — Encounter: Payer: Self-pay | Admitting: *Deleted

## 2020-09-22 ENCOUNTER — Other Ambulatory Visit: Payer: Self-pay | Admitting: *Deleted

## 2020-09-22 ENCOUNTER — Telehealth (INDEPENDENT_AMBULATORY_CARE_PROVIDER_SITE_OTHER): Payer: Medicare Other | Admitting: Nurse Practitioner

## 2020-09-22 ENCOUNTER — Other Ambulatory Visit: Payer: Self-pay

## 2020-09-22 ENCOUNTER — Encounter: Payer: Self-pay | Admitting: Nurse Practitioner

## 2020-09-22 ENCOUNTER — Telehealth: Payer: Self-pay | Admitting: *Deleted

## 2020-09-22 DIAGNOSIS — E1122 Type 2 diabetes mellitus with diabetic chronic kidney disease: Secondary | ICD-10-CM | POA: Diagnosis not present

## 2020-09-22 DIAGNOSIS — I5043 Acute on chronic combined systolic (congestive) and diastolic (congestive) heart failure: Secondary | ICD-10-CM | POA: Diagnosis not present

## 2020-09-22 DIAGNOSIS — J441 Chronic obstructive pulmonary disease with (acute) exacerbation: Secondary | ICD-10-CM

## 2020-09-22 DIAGNOSIS — N179 Acute kidney failure, unspecified: Secondary | ICD-10-CM | POA: Diagnosis not present

## 2020-09-22 DIAGNOSIS — I4819 Other persistent atrial fibrillation: Secondary | ICD-10-CM | POA: Diagnosis not present

## 2020-09-22 DIAGNOSIS — I130 Hypertensive heart and chronic kidney disease with heart failure and stage 1 through stage 4 chronic kidney disease, or unspecified chronic kidney disease: Secondary | ICD-10-CM | POA: Diagnosis not present

## 2020-09-22 DIAGNOSIS — N184 Chronic kidney disease, stage 4 (severe): Secondary | ICD-10-CM | POA: Diagnosis not present

## 2020-09-22 MED ORDER — BENZONATATE 100 MG PO CAPS: 100.0000 mg | ORAL_CAPSULE | Freq: Two times a day (BID) | ORAL | 0 refills | Status: DC | PRN

## 2020-09-22 MED ORDER — LEVOFLOXACIN 500 MG PO TABS: 500.0000 mg | ORAL_TABLET | Freq: Every day | ORAL | 0 refills | Status: DC

## 2020-09-22 NOTE — Telephone Encounter (Signed)
They were sent to [facility] [address]

## 2020-09-22 NOTE — Progress Notes (Signed)
Acute Office Visit  Subjective:    Patient ID: [name] [name], female    DOB: 03/22/1932, 86 y.o.   MRN: [id number]  Chief Complaint  Patient presents with  . Cough    X 4 days   . Wheezing    HPI Patient is in today for cough that has been ongoing for 4 days.  She endorses runny nose and SOB with exertion. Denies tachypnea, fever, chills, sinus pressure/pain, chest tightness, and wheezing. She uses her albuterol nebulizers PRN, and last use was "last week".  She estates she uses Breo sometimes.  Past Medical History:  Diagnosis Date  . Acute on chronic diastolic CHF (congestive heart failure) ([place]) 05/19/2012  . Acute on chronic respiratory failure with hypoxia ([place]) 01/31/2020  . Adenomatous polyp of colon 12/16/2002   Dr. [name]/[facility]  . Allergy   . Calculus of gallbladder with chronic cholecystitis without obstruction 02/09/2009   Qualifier: Diagnosis of  By: [name], [name]    . Cataract   . CHB (complete heart block) ([place]) 10/07/2014      . CHF (congestive heart failure) ([place])    a.  EF previously reduced to 25-30% b. EF improved to 60-65% by echo in 10/2017.   Marland Kitchen Chronic gout due to renal impairment    Per [facility] Records [facility]   . Chronic kidney disease, stage I 2006  . Cognitive communication deficit    Per [facility] Records [facility]   . Constipation       . Convulsion ([place])    Per [facility] Records [facility]   . COPD (chronic obstructive pulmonary disease) ([place])       . DJD (degenerative joint disease) of lumbar spine   . DM (diabetes mellitus) ([place]) 2006  . Dysphagia, unspecified    Per [facility] Records [facility]   . Esophageal reflux   . Glaucoma   . Gout       . H/O adenomatous polyp of colon 05/24/2012   2004 in [place] colonoscopy 2008 normal    . H/O heart  valve replacement with mechanical valve    a. s/p mechanical AVR in 2001 and mechanical MVR in 2004.  Marland Kitchen H/O wisdom tooth extraction   . HIP PAIN, RIGHT 04/23/2009   Qualifier: Diagnosis of  By: [name], [name]    . Hyperkalemia    Per [facility] Records [facility]   . Hyperlipemia   . Hypertensive heart and kidney disease with HF and with CKD stage I-IV ([place])    Per [facility] Records [facility]   . IDA (iron deficiency anemia)    Parenteral iron/[name]  . Insomnia       . Localized edema    Left arm, Per [facility] Records [facility]   . Long term (current) use of anticoagulants    Per [facility] Records [facility]   . Nausea without vomiting 06/07/2010   Qualifier: Diagnosis of  By: [name], [name]    . Non-ischemic cardiomyopathy ([place])    a. s/p MDT CRTD  . Obesity, unspecified   . Oxygen deficiency 2014   nocturnal;  . Papilloma of breast 03/06/2012   This was diagnosed as a left breast papilloma by ultrasound characteristics and symptoms in 2010. Because of possible medical risk  factors no surgical intervention has been done and it did doing annual followups. The area has been stable by physical examination and mammograms and ultrasound since 2010.   . Presence of cardiac pacemaker    Per [facility] Records [facility]   . Spinal stenosis of lumbar region 02/19/2013  . Spondylolisthesis   . Spondylosis   . Thyroid cancer ([place])    remote thyroidectomy, no recurrence, pt denies in 2016 thyroid cancer  . Type 2 diabetes mellitus with hyperglycemia ([place]) 07/15/2010  . Type 2 diabetes mellitus with hypoglycemia without coma ([place])    Per [facility] Records [facility]   . Unspecified chronic bronchitis ([place])    Per [facility] Electronic Medical  Records System   . Unspecified hypothyroidism       . Urinary tract infection, site not specified    Per [facility] Records [facility]   . 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 Vertigo         Past Surgical History:  Procedure Laterality Date  . AORTIC AND MITRAL VALVE REPLACEMENT  2001  . BI-VENTRICULAR IMPLANTABLE CARDIOVERTER DEFIBRILLATOR UPGRADE N/A 01/18/2015   Procedure: BI-VENTRICULAR IMPLANTABLE CARDIOVERTER DEFIBRILLATOR UPGRADE;  Surgeon: Evans Lance, MD;  Location: [facility] CATH LAB;  Service: Cardiovascular;  Laterality: N/A;  . BIV ICD GENERATOR CHANGEOUT N/A 01/30/2019   Procedure: BIV ICD GENERATOR CHANGEOUT;  Surgeon: Evans Lance, MD;  Location: [place] CV LAB;  Service: Cardiovascular;  Laterality: N/A;  . CARDIAC CATHETERIZATION    . CARDIAC VALVE REPLACEMENT    . CARDIOVERSION N/A 11/13/2016   Procedure: CARDIOVERSION;  Surgeon: Sanda Klein, MD;  Location: [facility] ENDOSCOPY;  Service: Cardiovascular;  Laterality: N/A;  . COLONOSCOPY  06/12/2007   Rourk- Normal rectum/Normal colon  . COLONOSCOPY  12/16/02   small hemorrhoids  . COLONOSCOPY  06/20/2012   Procedure: COLONOSCOPY;  Surgeon: Daneil Dolin, MD;  [facility];  Service: Endoscopy;  Laterality: N/A;  10:45  . defibrillator implanted 2006    . DOPPLER ECHOCARDIOGRAPHY  2012  . DOPPLER ECHOCARDIOGRAPHY  05,06,07,08,09,2011  . ESOPHAGOGASTRODUODENOSCOPY  06/12/2007   Rourk- normal esophagus, small hiatal hernia, otherwise normal stomach, D1, D2  . EYE SURGERY Right 2005  . EYE SURGERY Left 2006  . ICD LEAD REMOVAL Left 02/08/2015   Procedure: ICD LEAD REMOVAL;  Surgeon: Evans Lance, MD;  Location: [facility] OR;  Service: Cardiovascular;  Laterality: Left;  "Will plan extraction and insertion of a BiV PM"  **Dr. Roxan Hockey backing up case**  . IMPLANTABLE CARDIOVERTER DEFIBRILLATOR (ICD) GENERATOR CHANGE Left 02/08/2015   Procedure: ICD  GENERATOR CHANGE;  Surgeon: Evans Lance, MD;  Location: [place];  Service: Cardiovascular;  Laterality: Left;  . INSERT / REPLACE / REMOVE PACEMAKER    . PACEMAKER INSERTION  May 2016  . pacemaker placed  2004  . right breast cyst removed     benign   . right cataract removed     2005  . TEE WITHOUT CARDIOVERSION N/A 11/13/2016   Procedure: TRANSESOPHAGEAL ECHOCARDIOGRAM (TEE);  Surgeon: Sanda Klein, MD;  Location: [facility] ENDOSCOPY;  Service: Cardiovascular;  Laterality: N/A;  . THYROIDECTOMY      Family History  Problem Relation Age of Onset  . Pancreatic cancer Mother   . Heart disease Father   . Heart disease Sister   . Heart attack Sister   . Heart disease Brother   .  Diabetes Brother   . Heart disease Brother   . Diabetes Brother   . Hypertension Brother   . Lung cancer Brother     Social History   Socioeconomic History  . Marital status: Widowed    Spouse name: Not on file  . Number of children: 0  . Years of education: Not on file  . Highest education level: Not on file  Occupational History  . Occupation: retired    Fish farm manager: RETIRED  Tobacco Use  . Smoking status: Former Smoker    Packs/day: 0.75    Years: 40.00    Pack years: 30.00    Types: Cigarettes    Quit date: 03/08/1987    Years since quitting: 33.5  . Smokeless tobacco: Never Used  Vaping Use  . Vaping Use: Never used  Substance and Sexual Activity  . Alcohol use: No    Alcohol/week: 0.0 standard drinks  . Drug use: No  . Sexual activity: Not Currently  Other Topics Concern  . Not on file  Social History Narrative   From [place] (2004)   Social Determinants of Health   Financial Resource Strain: Low Risk   . Difficulty of Paying Living Expenses: Not hard at all  Food Insecurity: No Food Insecurity  . Worried About Charity fundraiser in the Last Year: Never true  . Ran Out of Food in the Last Year: Never true  Transportation Needs: No Transportation Needs  . Lack of Transportation  (Medical): No  . Lack of Transportation (Non-Medical): No  Physical Activity: Inactive  . Days of Exercise per Week: 0 days  . Minutes of Exercise per Session: 0 min  Stress: No Stress Concern Present  . Feeling of Stress : Only a little  Social Connections: Moderately Isolated  . Frequency of Communication with Friends and Family: More than three times a week  . Frequency of Social Gatherings with Friends and Family: Twice a week  . Attends Religious Services: More than 4 times per year  . Active Member of Clubs or Organizations: No  . Attends Archivist Meetings: Never  . Marital Status: Widowed  Intimate Partner Violence: Not on file    Outpatient Medications Prior to Visit  Medication Sig Dispense Refill  . acetaminophen (TYLENOL) 500 MG tablet Take 1 tablet (500 mg total) by mouth every 6 (six) hours as needed (pain). 30 tablet 0  . albuterol (PROVENTIL) (2.5 MG/3ML) 0.083% nebulizer solution Take 3 mLs (2.5 mg total) by nebulization in the morning and at bedtime. *May use additional one time if needed for shortness of breath 175 mL 0  . albuterol (VENTOLIN HFA) 108 (90 Base) MCG/ACT inhaler Inhale 1 puff into the lungs every 6 (six) hours as needed for wheezing or shortness of breath. 6.7 g 0  . allopurinol (ZYLOPRIM) 100 MG tablet TAKE ONE TABLET BY MOUTH ONCE DAILY. 30 tablet 0  . BREO ELLIPTA 100-25 MCG/INH AEPB INHALE (1) PUFF BY MOUTH INTO THE LUNGS DAILY 60 each 0  . cholecalciferol (VITAMIN D3) 25 MCG (1000 UNIT) tablet Take 1 tablet (1,000 Units total) by mouth in the morning.    Marland Kitchen doxycycline (VIBRAMYCIN) 100 MG capsule Take 1 capsule (100 mg total) by mouth 2 (two) times daily. One po bid x 7 days 14 capsule 0  . fluticasone (FLONASE) 50 MCG/ACT nasal spray Place 2 sprays into both nostrils daily. 48 g 1  . folic acid (FOLVITE) 1 MG tablet TAKE ONE TABLET BY MOUTH ONCE DAILY. [place]  tablet 0  . furosemide (LASIX) 40 MG tablet TAKE ONE TABLET BY MOUTH ONCE DAILY. 30  tablet 0  . gabapentin (NEURONTIN) 300 MG capsule TAKE (1) CAPSULE BY MOUTH TWICE DAILY 60 capsule 0  . levETIRAcetam (KEPPRA) 500 MG tablet TAKE (1) TABLET BY MOUTH TWICE DAILY AT 9AM AND 9PM 60 tablet 0  . Liniments (SALONPAS PAIN RELIEF PATCH EX) Apply 1 patch topically daily as needed (pain).     . metoprolol tartrate (LOPRESSOR) 50 MG tablet TAKE (1) TABLET BY MOUTH TWICE DAILY AT 9AM AND 9PM. 60 tablet 0  . nitroGLYCERIN (NITROSTAT) 0.4 MG SL tablet DISSOLVE 1 TABLET SUBLINGUALLY AS NEEDED FOR CHEST PAIN, MAY REPEAT EVERY 5 MINUTES. AFTER 3 CALL 911. 30 tablet 0  . NON FORMULARY Diet Change: Soft diet with dys 2 (ground) meats, thin liquids (NAS, cons CHO, heart healthy)    . ondansetron (ZOFRAN-ODT) 4 MG disintegrating tablet TAKE (1) TABLET BY MOUTH EVERY EIGHT HOURS AS NEEDED FOR NAUSEA OR VOMITING 10 tablet 0  . OXYGEN Inhale 3 L into the lungs continuous.    [name] [name] (SYSTANE) 0.4-0.3 % SOLN Apply 1 drop to eye at bedtime. Left eye    . polyethylene glycol (MIRALAX / GLYCOLAX) 17 g packet Take 17 g by mouth daily as needed. 14 each 0  . potassium chloride SA (KLOR-CON) 20 MEQ tablet TAKE ONE TABLET BY MOUTH ONCE DAILY. 30 tablet 0  . pravastatin (PRAVACHOL) 40 MG tablet TAKE ONE TABLET BY MOUTH ONCE DAILY. 30 tablet 0  . predniSONE (DELTASONE) 20 MG tablet Take 1 tablet (20 mg total) by mouth 2 (two) times daily. 10 tablet 0  . senna (SENOKOT) 8.6 MG TABS tablet Take 2 tablets (17.2 mg total) by mouth daily as needed for mild constipation. 120 tablet 0  . SYNTHROID 88 MCG tablet TAKE 1 TABLET BEFORE BREAKFAST. 30 tablet 0  . UNABLE TO FIND Compression Stockings R-  Ankle  26.5cm   Calf 38cm  Leg  44cm L-  Ankle  27.5 cm   Calf 39cm  Leg  44cm 1 each 0  . warfarin (COUMADIN) 10 MG tablet TAKE (1) TABLET BY MOUTH DAILY AT 4PM 30 tablet 0   Facility-Administered Medications Prior to Visit  Medication Dose Route Frequency Provider Last Rate Last Admin  . epoetin  alfa (EPOGEN,PROCRIT) injection 40,000 Units  40,000 Units Subcutaneous Once Farrel Gobble, MD        Allergies  Allergen Reactions  . Penicillins Other (See Comments)    Bruise    . Aspirin Other (See Comments)    On coumadin  . Fentanyl Dermatitis    Rash with patch   . Niacin Itching    Review of Systems  Constitutional: Negative.   HENT: Positive for congestion and rhinorrhea. Negative for sinus pressure, sinus pain and sore throat.   Respiratory: Positive for cough. Negative for chest tightness, shortness of breath and wheezing.   Cardiovascular: Negative.        Objective:    Physical Exam  There were no vitals taken for this visit. Wt Readings from Last 3 Encounters:  09/10/20 193 lb (87.5 kg)  08/24/20 199 lb (90.3 kg)  08/11/20 195 lb 6.4 oz (88.6 kg)    Health Maintenance Due  Topic Date Due  . URINE MICROALBUMIN  06/28/2018  . FOOT EXAM  07/18/2019  . COVID-19 Vaccine (3 - Booster for Moderna series) 07/07/2020  . HEMOGLOBIN A1C  09/22/2020    There are  no preventive care reminders to display for this patient.   Lab Results  Component Value Date   TSH 2.32 02/16/2020   Lab Results  Component Value Date   WBC 5.5 09/10/2020   HGB 8.4 (L) 09/10/2020   HCT 28.0 (L) 09/10/2020   MCV 96.9 09/10/2020   PLT 181 09/10/2020   Lab Results  Component Value Date   NA 140 09/10/2020   K 5.4 (H) 09/10/2020   CO2 28 09/10/2020   GLUCOSE 90 09/10/2020   BUN 51 (H) 09/10/2020   CREATININE 1.46 (H) 09/10/2020   BILITOT 0.6 07/07/2020   ALKPHOS 48 07/07/2020   AST 27 07/07/2020   ALT 18 07/07/2020   PROT 6.5 07/07/2020   ALBUMIN 2.7 (L) 07/07/2020   CALCIUM 8.8 (L) 09/10/2020   ANIONGAP 7 09/10/2020   Lab Results  Component Value Date   CHOL 124 01/05/2020   Lab Results  Component Value Date   HDL 44 (L) 01/05/2020   Lab Results  Component Value Date   LDLCALC 65 01/05/2020   Lab Results  Component Value Date   TRIG 73 01/05/2020    Lab Results  Component Value Date   CHOLHDL 2.8 01/05/2020   Lab Results  Component Value Date   HGBA1C 5.8 (A) 03/23/2020   HGBA1C 5.8 03/23/2020   HGBA1C 5.8 03/23/2020   HGBA1C 5.8 03/23/2020       Assessment & Plan:   Problem List Items Addressed This Visit      Respiratory   COPD with acute exacerbation (HCC)    -cough started 4 days ago -no significant SOB today, additionally she has not used her nebulizers since last week -Rx. Levofloxacin -encouraged her to use nebs PRN -Rx. tessalon        Relevant Medications   benzonatate (TESSALON) 100 MG capsule     -if no improvement, will consider CXR  Meds ordered this encounter  Medications  . levofloxacin (LEVAQUIN) 500 MG tablet    Sig: Take 1 tablet (500 mg total) by mouth daily.    Dispense:  5 tablet    Refill:  0  . benzonatate (TESSALON) 100 MG capsule    Sig: Take 1 capsule (100 mg total) by mouth 2 (two) times daily as needed for cough.    Dispense:  20 capsule    Refill:  0   Date:  09/22/2020   Location of Patient: Home Location of Provider: Office Consent was obtain for visit to be over via telehealth. I verified that I am speaking with the correct person using two identifiers.  I connected with  [name] [name] on 09/22/20 via telephone and verified that I am speaking with the correct person using two identifiers.   I discussed the limitations of evaluation and management by telemedicine. The patient expressed understanding and agreed to proceed.  Time Spent: 9 minutes   Noreene Larsson, NP

## 2020-09-22 NOTE — Patient Outreach (Signed)
[facility]) Care Management [facility] CM Telephone Outreach Post- SNF discharge day # 41 without hospital re-admission  09/22/2020  [name] [name] 05-Dec-1931 [id number]  Successful telephone outreach attempt to [name] [name], 86 y/o female referred to [facility] 08/13/20 by [facility] RN PAC after patient had recent hospitalization October 5- 14, 2021 for SOB/ pulmonary edema/ acute-on-chronic CHF exacerbation; during her hospitalization, patient had seizure event which complicated her hospitalization; she was discharged from hospital to SNF for rehabilitation and was subsequently discharged from SNF to home/ self-care with home health services in place on August 12, 2020.    HIPAA/ identity verified and purpose of call/ [facility] CM services and program reinforced with patient and personal care services caregiver "[name] [name]," who patient provided previous verbal consent to speak with "at any time."  Phone call completed with patient and caregiver together while phone on speaker mode.  Reports had recent development of productive cough; confirms she attended urgently scheduled virtual PCP office visit this morning; reports she does not think the the medications prescribed at time of recent ED visit 09/10/20 "took care of the problem."  Positive reinforcement provided for patient/ caregiver promptly notifying PCP of their concerns.  Patient sounds to be in no distress throughout call today.  -- reviewed with patient and caregiver recent ED visit and today's urgent PCP visit; confirmed patient took all prescribed medications post-ED as instructed; confirmed that she has contacted her outpatient pharmacy and expects newly prescribed medications from PCP visit this morning to be delivered today; confirmed no questions around medications -- reviewed with patient and caregiver current clinical condition and newly developed cough: positive reinforcement provided for promptly contacting PCP to address new  concerns -- reviewed with patient and caregiver recent weights at home: reports general weights at home consistently between 175-178 lbs with weight today of 176 lbs; discussed weight loss from previously reported weights of 188-192; patient attributes this to fluid loss post- SNF/ ED visits and confirms that her PCP is aware; encouraged ongoing daily weight monitoring and recording -- reviewed/ reinforced previously provided education around signs/ symptoms CHF yellow zone, along with corresponding action plan -- reviewed home use of O2 and confirmed patient continues using at 2-3 L/ min -- confirmed no recent changes or concerns around medications: patient continues to use established blister packaging for maintenance medications from outpatient pharmacy; confirmed caregiver and family continue to assist/ remind patient to take medications; family prepare pill box for short-term medications -- confirmed home health services remain in place for PT and Nursing- encouraged patient's ongoing active participation -- Advanced directive planning packet and education previously mailed to patient; she confirms she recieved in mail and has not yet completed; confirms she wishes to be fully resuscitated if needed: Advanced directives template updated accordingly; patient encouraged to continue considering completing documents for advanced Directives -- discussed with patient and caregiver upcoming change of [facility] RN CM and encouraged their ongoing engagement with next [facility] RN CM   Patient denies further issues, concerns, or problems today. Iprovided/confirmed patientwithmy direct phone number, the main [facility] CM office phone number, and the [facility] CM 24-hour nurse advice phone number should issues arise prior to next [facility] CM outreachnext month by new [facility] RN CM. Encouraged patient/ caregiver to contact me directly if needs, questions, issues, or concerns arise prior to next outreach; they agreed to do  so.  Plan:  Patient will take medications as prescribed and will attend all scheduled provider appointments  Patient will promptly notify care  providers for any new concerns/ issues/ problems that arise  Patient will actively participate in home health services as ordered post-hospital discharge  Patient will continue monitoring/ recording daily weights and following heart healthy/ low salt diet  [facility] CM outreachto continue with phone call next month, unless indicated sooner  [name], RN, BSN, [employer] Coordinator [facility] Care Management  [phone]

## 2020-09-22 NOTE — Telephone Encounter (Signed)
Pt called wondering why her medications were not sent in after her phone visit please advise

## 2020-09-22 NOTE — Assessment & Plan Note (Addendum)
-  cough started 4 days ago -no significant SOB today, additionally she has not used her nebulizers since last week -Rx. Levofloxacin -encouraged her to use nebs PRN -Rx. Tessalon -if no improvement in 5 days will consider CXR, would need mobile CXR through [facility] [place] contact # [phone]

## 2020-09-22 NOTE — Patient Instructions (Addendum)
Goals Addressed            This Visit's Progress   . COMPLETED: THN CM: Learn More About My Health   On track    Follow Up Date 09/22/20   - tell my story and reason for my visit - make a list of questions - ask questions - repeat what I heard to make sure I understand - bring a list of my medicines to the visit - speak up when I don't understand    Why is this important?   The best way to learn about your health and care is by talking to the doctor and nurse.  They will answer your questions and give you information in the way that you like best.    Notes:     . THN CM: Maintain My Quality of Life   On track    Follow Up Date 10/25/20   - complete a living will - discuss my treatment options with the doctor or nurse - make shared treatment decisions with doctor - name a health care proxy (decision maker)    Why is this important?   Having a long-term illness can be scary.  It can also be stressful for you and your caregiver.  These steps may help.    Notes:   08/16/20:  -- will send patient printed information around creating Advanced Directives for Living Will/ HCPOA  08/25/20: -- confirmed with caregiver/ patient that patient discussed with PCP yesterday at office visit; discussed need for patient to obtain DNR/ MOST form for posting in her home; provided education to facilitate obtaining form from PCP provider  09/22/20: -- Advanced directive planning packet and education previously mailed.  Patient confirms today that she wishes to be fully resuscitated if needed and has made her PCP aware of same  Ms. Bessey, please continue considering taking steps to complete the documents for Advanced Directives so that your wishes will be followed; once you complete the documents and have them notarized, take them to your doctor so a copy can be made and placed in your medical record.  Remember- don't sign the documents until you are in front of notary- your signature must be  witnessed before it becomes active      . THN CM: Make and Keep All Appointments   On track    Follow Up Date 10/25/20   - arrange a ride through an agency 1 week before appointment - ask family or friend for a ride - keep a calendar with appointment dates    Why is this important?   Part of staying healthy is seeing the doctor for follow-up care.  If you forget your appointments, there are some things you can do to stay on track.    Notes:   08/25/20: --confirmed that patient's CNA continues to provide transportation to provider appointments; confirmed accurate understanding of upcoming scheduled provider appointments  09/22/20: -- Doristine Devoid job attending your doctor's appointments and contacting your doctor early when you have concerns- keep up the great work!     Margie Billet CM: Protect My Health   On track    Follow Up Date 10/25/20   - schedule appointment for vaccines needed due to my age or health - schedule recommended health tests (blood work, mammogram, colonoscopy, pap test) - schedule and keep appointment for annual check-up    Why is this important?   Screening tests can find diseases early when they are easier to treat.  Your doctor  or nurse will talk with you about which tests are important for you.  Getting shots for common diseases like the flu and shingles will help prevent them.     Notes:   09/22/20: -- remember to get all your recommended vaccines and if you have questions, talk to your doctor -- keep a written record of the dates you received your vaccines so you can stay on top of this from year to year    . THN CM: Track and Manage Fluids and Swelling   On track    Follow Up Date 10/25/20   - call office if I gain more than 2 pounds in one day or 5 pounds in one week - do ankle pumps when sitting - keep legs up while sitting - track weight in diary - use salt in moderation - watch for swelling in feet, ankles and legs every day - weigh myself daily     Why is this important?   It is important to check your weight daily and watch how much salt and liquids you have.  It will help you to manage your heart failure.    Notes:   08/16/20: -- patient reports daily weight monitoring and recording: reports post- SNF discharge weights 188-190 lbs -- discussed patient's baseline use of home Oc: 2-3 L/min via [place]; patient using continuously  08/25/20: -- reviewed yesterday's PCP office visit with patient and caregiver; confirmed that patient took extra diuretic/ fluid pills as directed post- PCP appointment -- reviewed with patient/ caregiver recent weights at home; confirmed target weight of "185 lbs;" reinforced previously provided education around weight gain guidelines in setting of CHF along with corrsponding action plan for same -- confirmed that patient continues using outpatient pharmacy blister packaging for medications and that no other changes to medications were made -- discussed ongoing use of home O2 with patient/ caregiver and confirmed no changes- continues using at 2-3 L/min via [place] -- discussed additional signs/ symptoms CHF yellow zone and confirmed no current clinical concerns  09/22/20: -- great job continuing to monitor and record your daily weights at home: this is the best way to stay on top of fluid build-up -- your reported weight today is: 176 pounds; please continue to keep your doctor aware of any concerns you have around weight loss -- as we have been discussing, take your medications as prescribed and report weight gain greater than 2-3 pounds overnight/ 5 pounds in one week to your doctor: calling your doctor early for weight gain, even if you feel fine, is one of the most important things you can do to take care of yourself and manage your fluid at home  -- continues following a heart healthy and low salt diet -- continue using your home oxygen as prescribed -- be sure to take the newly prescribed antibiotics and cough  medication as instructed    . THN CM: Track and Manage Symptoms   On track    Follow Up Date 10/25/20   - develop a rescue plan - eat more whole grains, fruits and vegetables, lean meats and healthy fats - follow rescue plan if symptoms flare-up - know when to call the doctor - track symptoms and what helps feel better or worse - dress right for the weather, hot or cold    Why is this important?   You will be able to handle your symptoms better if you keep track of them.  Making some simple changes to your lifestyle will help.  Eating healthy is one thing you can do to take good care of yourself.    Notes:   08/25/20: -- confirmed that patient follows heart healthy/ low salt diet- reinforced importance of limiting salt in diet in setting of CHF  09/22/20: -- great job continuing to monitor and record your daily weights at home: this is the best way to stay on top of fluid build-up -- your reported weight today is: 176 pounds; please continue to keep your doctor aware of any concerns you have around weight loss -- as we have been discussing, take your medications as prescribed and report weight gain greater than 2-3 pounds overnight/ 5 pounds in one week to your doctor: calling your doctor early for weight gain, (even if you feel fine), is one of the most important things you can do to take care of yourself and manage your fluid at home  -- other signs/ symptoms to report is: new cough, increasing swelling in feet/ ankles/ abdomen, increased shortness of breath that is worse than usual; less energy than normal -- continues following a heart healthy and low salt diet -- continue using your home oxygen as prescribed -- be sure to take the newly prescribed antibiotics and cough medication as instructed       Antibiotic Medicine, Adult  Antibiotic medicines treat infections caused by a type of germ called bacteria. They work by killing the bacteria that make you sick. When do I need to  take antibiotics? You often need these medicines to treat bacterial infections, such as:  A urinary tract infection (UTI).  Strep throat.  Meningitis. This affects the spinal cord and brain.  A bad lung infection. You may start the medicines while your doctor waits for tests to come back. When the tests come back, your doctor may change or stop your medicine. When are antibiotics not needed? You do not need these medicines for most common illnesses, such as:  A cold.  The flu.  A sore throat. Antibiotics are not always needed for all infections caused by bacteria. Do not ask for these medicines, or take them, when they are not needed. What are the risks of taking antibiotics? Most antibiotics can cause an infection called Clostridioides difficile (C. diff). This causes watery poop (diarrhea). Let your doctor know right away if:  You have watery poop while taking an antibiotic.  You have watery poop after you stop taking an antibiotic. The illness can happen weeks after you stop the medicine. You also have a risk of getting an infection in the future that antibiotics cannot treat (antibiotic-resistant infection). This type of infection can be dangerous. What else should I know about taking antibiotics?   You need to take the entire prescription. ? Take the medicine for as long as told by your doctor. ? Do not stop taking it even if you start to feel better.  Try not to miss any doses. If you miss a dose, call your doctor.  Birth control pills may not work. If you take birth control pills: ? Keep on taking them. ? Use a second form of birth control, such as a condom. Do this for as long as told by your doctor.  Ask your doctor: ? How long to wait in between doses. ? If you should take the medicine with food. ? If there is anything you should stay away from while taking the antibiotic, such as:  Food.  Drinks.  Medicines. ? If there are any side effects you  should watch  for.  Only take the medicines that your doctor told you to take. Do not take medicines that were given to someone else.  Drink a large glass of water with the medicine.  Ask the pharmacist for a tool to measure the medicine, such as: ? A syringe. ? A cup. ? A spoon.  Throw away any extra medicine. Contact a doctor if:  You get worse.  You have new joint pain or muscle aches after starting the medicine.  You have side effects from the medicine, such as: ? Stomach pain. ? Watery poop. ? Feeling sick to your stomach (nausea). Get help right away if:  You have signs of a very bad allergic reaction. If this happens, stop taking the medicine right away. Signs may include: ? Hives. These are raised, itchy, red bumps on the skin. ? Skin rash. ? Trouble breathing. ? Wheezing. ? Swelling. ? Feeling dizzy. ? Throwing up (vomiting).  Your pee (urine) is dark, or is the color of blood.  Your skin turns yellow.  You bruise easily.  You bleed easily.  You have very bad watery poop and cramps in your belly.  You have a very bad headache. Summary  Antibiotics are often used to treat infections caused by bacteria.  Only take these medicines when needed.  Let your doctor know if you have watery poop while taking an antibiotic.  You need to take the entire prescription. This information is not intended to replace advice given to you by your health care provider. Make sure you discuss any questions you have with your health care provider. Document Revised: 10/18/2018 Document Reviewed: 09/13/2016 Elsevier Patient Education  2020 [place].   Heart Failure Exacerbation  Heart failure is a condition in which the heart does not fill up with enough blood, and therefore does not pump enough blood and oxygen to the body. When this happens, parts of the body do not get the blood and oxygen they need to function properly. This can cause symptoms such as breathing problems, fatigue,  swelling, and confusion. Heart failure exacerbation refers to heart failure symptoms that get worse. The symptoms may get worse suddenly or develop slowly over time. Heart failure exacerbation is a serious medical problem that should be treated right away. What are the causes? A heart failure exacerbation can be triggered by:  Not taking your heart failure medicines correctly.  Infections.  Eating an unhealthy diet or a diet that is high in salt (sodium).  Drinking too much fluid.  Drinking alcohol.  Taking illegal drugs, such as cocaine or methamphetamine.  Not exercising. Other causes include:  Other heart conditions such as an irregular heartbeat (arrhythmia).  Anemia.  Other medical problems, such as kidney failure. Sometimes the cause of the exacerbation is not known. What are the signs or symptoms? When heart failure symptoms suddenly or slowly get worse, this may be a sign of heart failure exacerbation. Symptoms of heart failure include:  Breathing problems or shortness of breath.  Chronic coughing or wheezing.  Fatigue.  Nausea or lack of appetite.  Feeling light-headed.  Confusion or memory loss.  Increased heart rate or irregular heartbeat.  Buildup of fluid in the legs, ankles, feet, or abdomen.  Difficulty breathing when lying down. How is this diagnosed? This condition is diagnosed based on:  Your symptoms and medical history.  A physical exam. You may also have tests, including:  Electrocardiogram (ECG). This test measures the electrical activity of your heart.  Echocardiogram. This test uses sound waves to take a picture of your heart to see how well it works.  Blood tests.  Imaging tests, such as: ? Chest X-ray. ? MRI. ? Ultrasound.  Stress test. This test examines how well your heart functions when you exercise. Your heart is monitored while you exercise on a treadmill or exercise bike. If you cannot exercise, medicines may be used to  increase your heartbeat in place of exercise.  Cardiac catheterization. During this test, a thin, flexible tube (catheter) is inserted into a blood vessel and threaded up to your heart. This test allows your health care provider to check the arteries that lead to your heart (coronary arteries).  Right heart catheterization. During this test, the pressure in your heart is measured. How is this treated? This condition may be treated by:  Adjusting your heart medicines.  Maintaining a healthy lifestyle. This includes: ? Eating a heart-healthy diet that is low in sodium. ? Not using any products that contain nicotine or tobacco, such as cigarettes and e-cigarettes. ? Regular exercise. ? Monitoring your fluid intake. ? Monitoring your weight and reporting changes to your health care provider.  Treating sleep apnea, if you have this condition.  Surgery. This may include: ? Implanting a device that helps both sides of your heart contract at the same time (cardiac resynchronization therapy device). This can help with heart function and relieve heart failure symptoms. ? Implanting a device that can correct heart rhythm problems (implantable cardioverter defibrillator). ? Connecting a device to your heart to help it pump blood (ventricular assist device). ? Heart transplant. Follow these instructions at home: Medicines  Take over-the-counter and prescription medicines only as told by your health care provider.  Do not stop taking your medicines or change the amount you take. If you are having problems or side effects from your medicines, talk to your health care provider.  If you are having difficulty paying for your medicines, contact a social worker or your clinic. There are many programs to assist with medicine costs.  Talk to your health care provider before starting any new medicines or supplements.  Make sure your health care provider and pharmacist have a list of all the medicines you  are taking. Eating and drinking   Avoid drinking alcohol.  Eat a heart-healthy diet as told by your health care provider. This includes: ? Plenty of fruits and vegetables. ? Lean proteins. ? Low-fat dairy. ? Whole grains. ? Foods that are low in sodium. Activity   Exercise regularly as told by your health care provider. Balance exercise with rest.  Ask your health care provider what activities are safe for you. This includes sexual activity, exercise, and daily tasks at home or work. Lifestyle  Do not use any products that contain nicotine or tobacco, such as cigarettes and e-cigarettes. If you need help quitting, ask your health care provider.  Maintain a healthy weight. Ask your health care provider what weight is healthy for you.  Consider joining a patient support group. This can help with emotional problems you may have, such as stress and anxiety. General instructions  Talk to your health care provider about flu and pneumonia vaccines.  Keep a list of medicines that you are taking. This may help in emergency situations.  Keep all follow-up visits as told by your health care provider. This is important. Contact a health care provider if:  You have questions about your medicines or you miss a dose.  You feel  anxious, depressed, or stressed.  You have swelling in your feet, ankles, legs, or abdomen.  You have shortness of breath during activity or exercise.  You have a cough.  You have a fever.  You have trouble sleeping.  You gain 2-3 lb (1-1.4 kg) in 24 hours or 5 lb (2.3 kg) in a week. Get help right away if:  You have chest pain.  You have shortness of breath while resting.  You have severe fatigue.  You are confused.  You have severe dizziness.  You have a rapid or irregular heartbeat.  You have nausea or you vomit.  You have a cough that is worse at night or you cannot lie flat.  You have a cough that will not go away.  You have severe  depression or sadness. Summary  When heart failure symptoms get worse, it is called heart failure exacerbation.  Common causes of this condition include taking medicines incorrectly, infections, and drinking alcohol.  This condition may be treated by adjusting medicines, maintaining a healthy lifestyle, or surgery.  Do not stop taking your medicines or change the amount you take. If you are having problems or side effects from your medicines, talk to your health care provider. This information is not intended to replace advice given to you by your health care provider. Make sure you discuss any questions you have with your health care provider. Document Revised: 08/24/2017 Document Reviewed: 01/23/2017 Elsevier Patient Education  [place].

## 2020-09-22 NOTE — Telephone Encounter (Signed)
Pt advised With verbal understanding

## 2020-09-22 NOTE — Patient Outreach (Signed)
[facility]) Care Management [place] Telephone Outreach Care Coordination  09/22/2020  Inaya Gillham Nov 16, 1931 [id number]   [facility] CM Telephone Outreach Care Coordination re:  Dannon Perlow, 86 y/o female referred to [facility] CM 08/13/20 by [facility] RN PAC after patient had recent hospitalization October 5- 14, 2021 for SOB/ pulmonary edema/ acute-on-chronic CHF exacerbation; during her hospitalization, patient had seizure event which complicated her hospitalization; she was discharged from hospital to SNF for rehabilitation and was subsequently discharged from SNF to home/ self-care with home health services in place on August 12, 2020.  I had spoken with patient and her caregiver [name] this morning post- PCP appointment and they reported that they had contacted [facility] to have medications that were prescribed this morning to be delivered to patient this afternoon; received call from patient's caregiver this afternoon stating that [facility] had not received the prescriptions--  Through review of EHR, was able to confirm that prescriptions were sent to [facility] [phone]): called and spoke with Jeneen Rinks who confirms that both of new prescriptions from this morning PCP office visit have been filled and are now ready for pick up  Contacted patient and her caregiver to update-- they are agreeable to having family member pick up medications from [facility] where it is ready, and will pick up/ begin taking medication today.  Contacted patient's PCP to update that the medication concern has been resolved.  [name], RN, BSN, [employer] [facility] Care Management  [phone]

## 2020-09-23 ENCOUNTER — Ambulatory Visit (INDEPENDENT_AMBULATORY_CARE_PROVIDER_SITE_OTHER): Payer: Medicare Other | Admitting: Cardiology

## 2020-09-23 ENCOUNTER — Telehealth: Payer: Self-pay | Admitting: Cardiovascular Disease

## 2020-09-23 ENCOUNTER — Other Ambulatory Visit (HOSPITAL_COMMUNITY)
Admission: RE | Admit: 2020-09-23 | Discharge: 2020-09-23 | Disposition: A | Discharge status: Home / Self Care | Payer: Medicare Other | Source: Ambulatory Visit | Attending: Nephrology | Admitting: Nephrology

## 2020-09-23 ENCOUNTER — Telehealth: Payer: Self-pay | Admitting: *Deleted

## 2020-09-23 DIAGNOSIS — Z5181 Encounter for therapeutic drug level monitoring: Secondary | ICD-10-CM

## 2020-09-23 DIAGNOSIS — I4819 Other persistent atrial fibrillation: Secondary | ICD-10-CM | POA: Diagnosis not present

## 2020-09-23 DIAGNOSIS — I5043 Acute on chronic combined systolic (congestive) and diastolic (congestive) heart failure: Secondary | ICD-10-CM | POA: Diagnosis not present

## 2020-09-23 DIAGNOSIS — N1832 Chronic kidney disease, stage 3b: Secondary | ICD-10-CM | POA: Diagnosis not present

## 2020-09-23 DIAGNOSIS — I129 Hypertensive chronic kidney disease with stage 1 through stage 4 chronic kidney disease, or unspecified chronic kidney disease: Secondary | ICD-10-CM | POA: Diagnosis not present

## 2020-09-23 DIAGNOSIS — N184 Chronic kidney disease, stage 4 (severe): Secondary | ICD-10-CM | POA: Diagnosis not present

## 2020-09-23 DIAGNOSIS — N189 Chronic kidney disease, unspecified: Secondary | ICD-10-CM | POA: Insufficient documentation

## 2020-09-23 DIAGNOSIS — D631 Anemia in chronic kidney disease: Secondary | ICD-10-CM | POA: Insufficient documentation

## 2020-09-23 DIAGNOSIS — R809 Proteinuria, unspecified: Secondary | ICD-10-CM | POA: Insufficient documentation

## 2020-09-23 DIAGNOSIS — N179 Acute kidney failure, unspecified: Secondary | ICD-10-CM | POA: Diagnosis not present

## 2020-09-23 DIAGNOSIS — I130 Hypertensive heart and chronic kidney disease with heart failure and stage 1 through stage 4 chronic kidney disease, or unspecified chronic kidney disease: Secondary | ICD-10-CM | POA: Diagnosis not present

## 2020-09-23 DIAGNOSIS — E1122 Type 2 diabetes mellitus with diabetic chronic kidney disease: Secondary | ICD-10-CM | POA: Diagnosis not present

## 2020-09-23 LAB — RENAL FUNCTION PANEL
Albumin: 3.2 g/dL — ABNORMAL LOW (ref 3.5–5.0)
Anion gap: 9 (ref 5–15)
BUN: 54 mg/dL — ABNORMAL HIGH (ref 8–23)
CO2: 29 mmol/L (ref 22–32)
Calcium: 9.1 mg/dL (ref 8.9–10.3)
Chloride: 103 mmol/L (ref 98–111)
Creatinine, Ser: 1.51 mg/dL — ABNORMAL HIGH (ref 0.44–1.00)
GFR, Estimated: 33 mL/min — ABNORMAL LOW (ref >60)
Glucose, Bld: 101 mg/dL — ABNORMAL HIGH (ref 70–99)
Phosphorus: 2.9 mg/dL (ref 2.5–4.6)
Potassium: 5.8 mmol/L — ABNORMAL HIGH (ref 3.5–5.1)
Sodium: 141 mmol/L (ref 135–145)

## 2020-09-23 LAB — IRON AND TIBC
Iron: 30 ug/dL (ref 28–170)
Saturation Ratios: 12 % (ref 10.4–31.8)
TIBC: 246 ug/dL — ABNORMAL LOW (ref 250–450)
UIBC: 216 ug/dL

## 2020-09-23 LAB — CBC
HCT: 29.4 % — ABNORMAL LOW (ref 36.0–46.0)
Hemoglobin: 8.8 g/dL — ABNORMAL LOW (ref 12.0–15.0)
MCH: 28.5 pg (ref 26.0–34.0)
MCHC: 29.9 g/dL — ABNORMAL LOW (ref 30.0–36.0)
MCV: 95.1 fL (ref 80.0–100.0)
Platelets: 189 10*3/uL (ref 150–400)
RBC: 3.09 MIL/uL — ABNORMAL LOW (ref 3.87–5.11)
RDW: 16.8 % — ABNORMAL HIGH (ref 11.5–15.5)
WBC: 8.7 10*3/uL (ref 4.0–10.5)
nRBC: 0 % (ref 0.0–0.2)

## 2020-09-23 LAB — POCT INR: INR: 3.2 — AB (ref 2.0–3.0)

## 2020-09-23 LAB — FERRITIN: Ferritin: 307 ng/mL (ref 11–307)

## 2020-09-23 NOTE — Telephone Encounter (Signed)
[name] [name] from [facility] called and wanted to speak to Dr. Lurline Del Nurse about her medications.   Pt c/o medication issue:  1. Name of Medication: hydrALAZINE (APRESOLINE) 25 MG tablet   2. How are you currently taking this medication (dosage and times per day)? Patient took one dose 12/29 and one dose 12/30  3. Are you having a reaction (difficulty breathing--STAT)? no  4. What is your medication issue? Home Health Nurse wanted to verify whether or not Dr. Loletha Grayer wants the patient to be taking this medication. HHN was inder the impression that Dr. Tobi Bastos this medication.  The patient's family picked up medications from [facility] for the patient and this was included when the family picked up the new medication.   Please verify what the patient should and should not be taking with the [facility].

## 2020-09-23 NOTE — Telephone Encounter (Signed)
INR 3.2  PT 38.4  Please call Stark Klein, with [facility].

## 2020-09-23 NOTE — Patient Instructions (Signed)
Description   Spoke with Janett Billow RN [facility] while she is at pt's home. Pt has 10mg  tablets of warfarin on hand. Instructed for pt to hold warfarin today, then start taking 1 tablet  (10mg ) daily except for 1/2 a tablet (5mg )  on Monday, Wednesday and Friday. [place] will recheck INR on 09/27/2020 pt is on levaquin.

## 2020-09-23 NOTE — Telephone Encounter (Signed)
Returned the call to [name] [name] from [employer] [facility]. She has been made aware that the Hydralazine was discontinued in October at her hospital stay.

## 2020-09-23 NOTE — Telephone Encounter (Signed)
Please see anti-coag encounter from today for further documentation. Pt is due to have INR checked on 09/28/2019

## 2020-09-24 DIAGNOSIS — I129 Hypertensive chronic kidney disease with stage 1 through stage 4 chronic kidney disease, or unspecified chronic kidney disease: Secondary | ICD-10-CM | POA: Diagnosis not present

## 2020-09-24 DIAGNOSIS — N1832 Chronic kidney disease, stage 3b: Secondary | ICD-10-CM | POA: Diagnosis not present

## 2020-09-24 DIAGNOSIS — N189 Chronic kidney disease, unspecified: Secondary | ICD-10-CM | POA: Diagnosis not present

## 2020-09-24 DIAGNOSIS — D631 Anemia in chronic kidney disease: Secondary | ICD-10-CM | POA: Diagnosis not present

## 2020-09-24 DIAGNOSIS — I5043 Acute on chronic combined systolic (congestive) and diastolic (congestive) heart failure: Secondary | ICD-10-CM | POA: Diagnosis not present

## 2020-09-24 DIAGNOSIS — E875 Hyperkalemia: Secondary | ICD-10-CM | POA: Diagnosis not present

## 2020-09-24 IMAGING — DX DG FOOT COMPLETE 3+V*R*
3 series · 3 of 3 positions shown · non-contrast
Comparison: None.

CLINICAL DATA: Possibly stepped on a needle last week

EXAM:
RIGHT FOOT COMPLETE - 3+ VIEW

[foot ap]
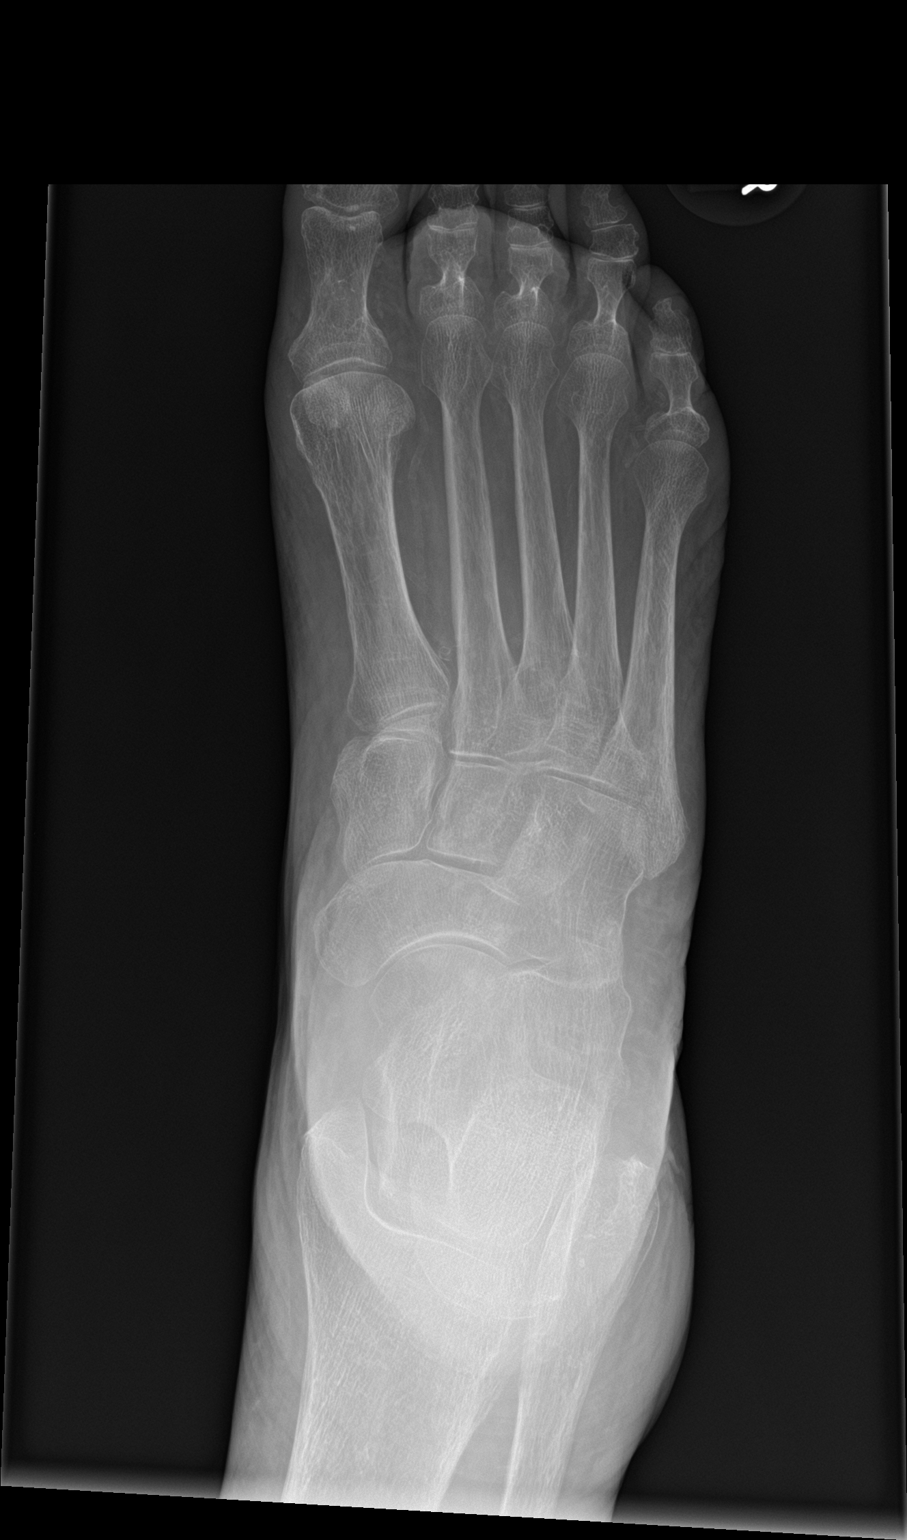

[foot obl]
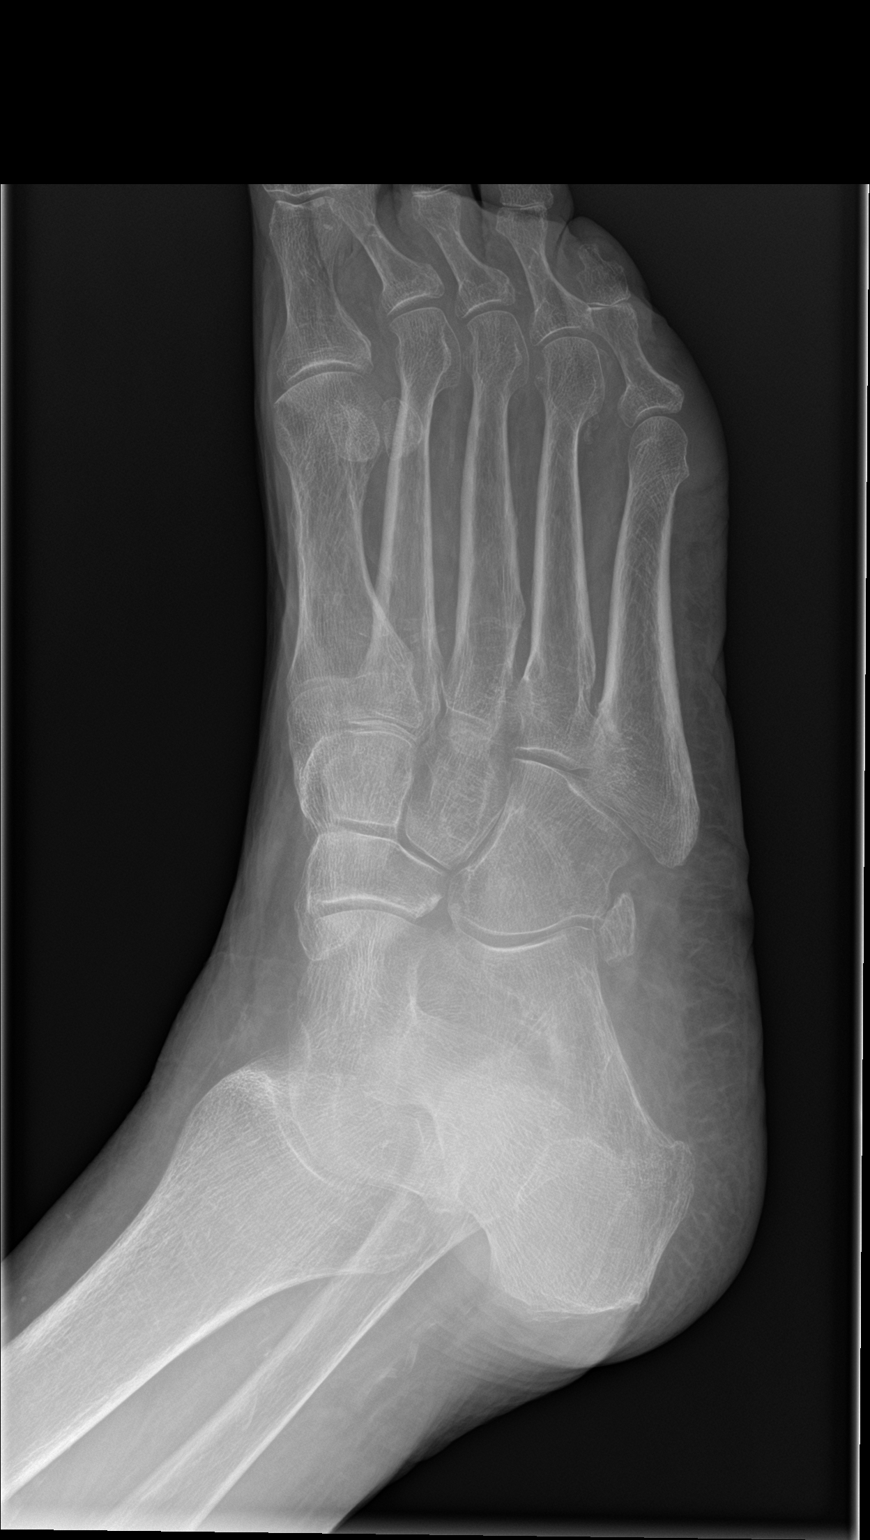

[foot lat]
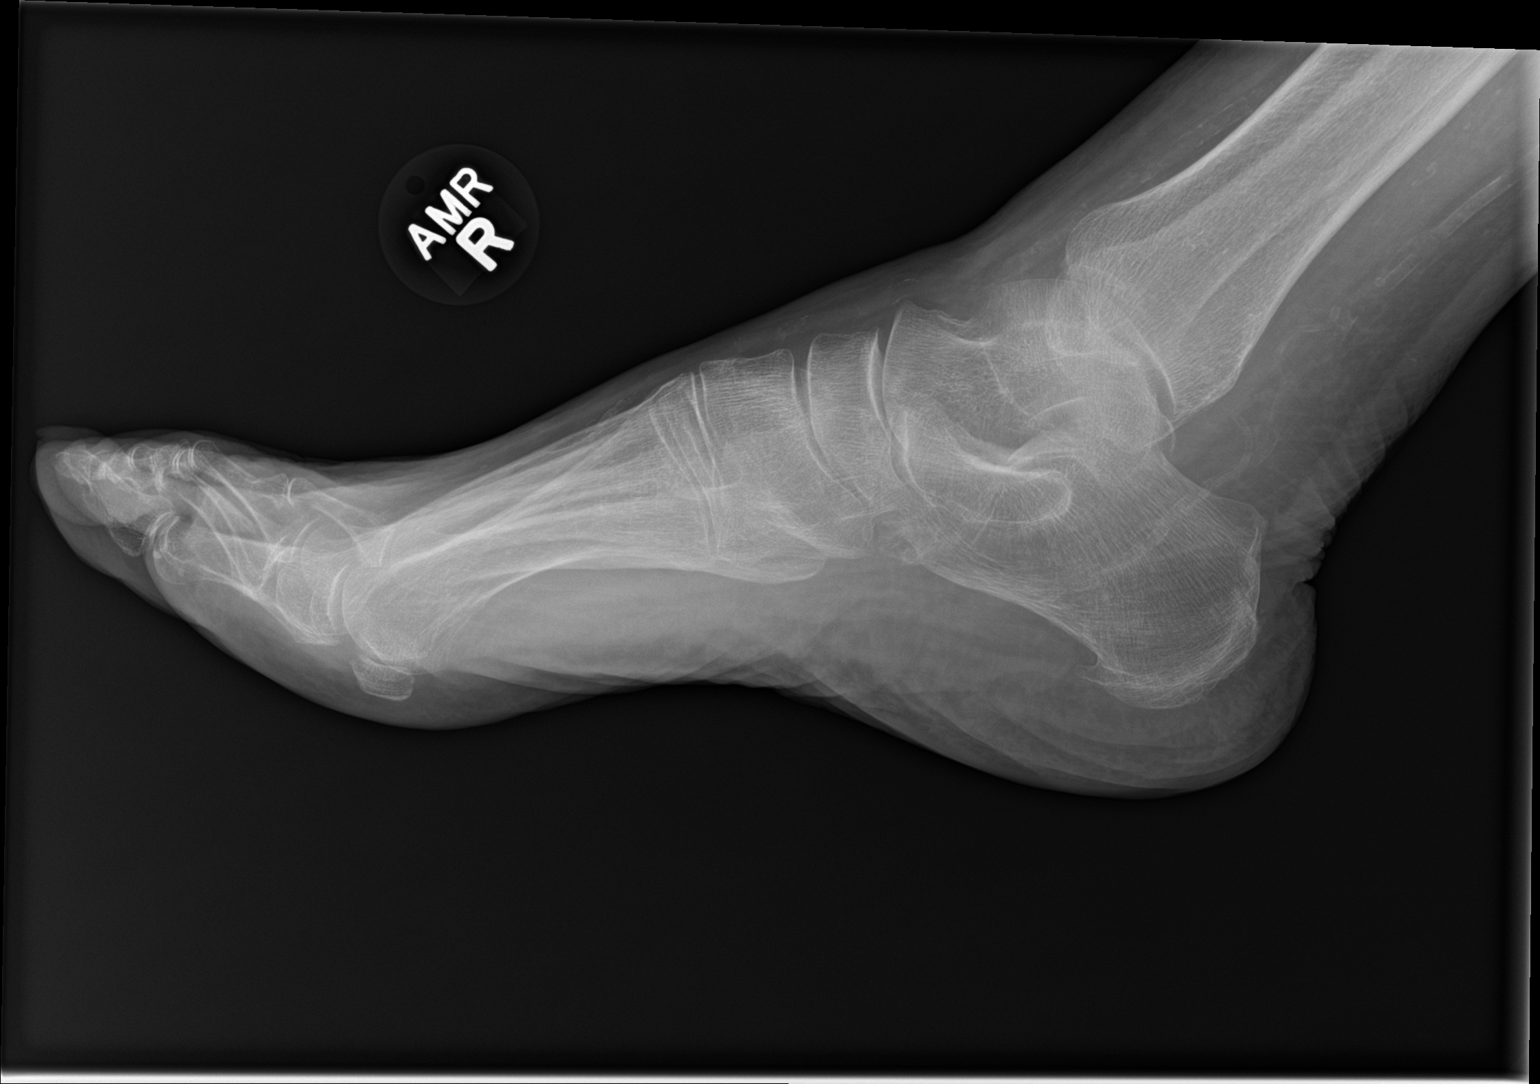

[3 of 3 positions shown; findings below may reference images not displayed]

FINDINGS: No metallic foreign body. No fracture or malalignment. Generalized
osteopenia. Plantar heel spur.
IMPRESSION: No acute finding or metallic foreign body.

## 2020-09-27 ENCOUNTER — Ambulatory Visit (INDEPENDENT_AMBULATORY_CARE_PROVIDER_SITE_OTHER): Payer: Medicare Other | Admitting: *Deleted

## 2020-09-27 DIAGNOSIS — I4819 Other persistent atrial fibrillation: Secondary | ICD-10-CM | POA: Diagnosis not present

## 2020-09-27 DIAGNOSIS — Z952 Presence of prosthetic heart valve: Secondary | ICD-10-CM

## 2020-09-27 DIAGNOSIS — N179 Acute kidney failure, unspecified: Secondary | ICD-10-CM | POA: Diagnosis not present

## 2020-09-27 DIAGNOSIS — Z5181 Encounter for therapeutic drug level monitoring: Secondary | ICD-10-CM | POA: Diagnosis not present

## 2020-09-27 DIAGNOSIS — N184 Chronic kidney disease, stage 4 (severe): Secondary | ICD-10-CM | POA: Diagnosis not present

## 2020-09-27 DIAGNOSIS — I130 Hypertensive heart and chronic kidney disease with heart failure and stage 1 through stage 4 chronic kidney disease, or unspecified chronic kidney disease: Secondary | ICD-10-CM | POA: Diagnosis not present

## 2020-09-27 DIAGNOSIS — I5043 Acute on chronic combined systolic (congestive) and diastolic (congestive) heart failure: Secondary | ICD-10-CM | POA: Diagnosis not present

## 2020-09-27 DIAGNOSIS — E1122 Type 2 diabetes mellitus with diabetic chronic kidney disease: Secondary | ICD-10-CM | POA: Diagnosis not present

## 2020-09-27 LAB — POCT INR: INR: 3.2 — AB (ref 2.0–3.0)

## 2020-09-27 NOTE — Patient Instructions (Signed)
Spoke with [facility] while she is at pt's home. Pt has 10mg  tablets of warfarin on hand. Instructed for pt to hold warfarin today, then continue taking 1 tablet  (10mg ) daily except for 1/2 a tablet (5mg )  on Monday, Wednesday and Friday. [place] will recheck INR on 10/04/2020 pt will finish levaquin tomorrow.

## 2020-09-28 ENCOUNTER — Ambulatory Visit (HOSPITAL_COMMUNITY): Payer: Medicare Other | Admitting: Oncology

## 2020-09-28 ENCOUNTER — Inpatient Hospital Stay (HOSPITAL_COMMUNITY): Payer: Medicare Other | Attending: Hematology

## 2020-09-28 ENCOUNTER — Inpatient Hospital Stay (HOSPITAL_BASED_OUTPATIENT_CLINIC_OR_DEPARTMENT_OTHER): Payer: Medicare Other | Admitting: Oncology

## 2020-09-28 ENCOUNTER — Ambulatory Visit (HOSPITAL_COMMUNITY): Payer: Medicare Other

## 2020-09-28 ENCOUNTER — Other Ambulatory Visit: Payer: Self-pay

## 2020-09-28 ENCOUNTER — Inpatient Hospital Stay (HOSPITAL_COMMUNITY): Payer: Medicare Other

## 2020-09-28 ENCOUNTER — Other Ambulatory Visit (HOSPITAL_COMMUNITY): Payer: Medicare Other

## 2020-09-28 VITALS — BP 133/72 | HR 62 | Temp 96.8°F | Resp 18 | Wt 190.0 lb

## 2020-09-28 DIAGNOSIS — N184 Chronic kidney disease, stage 4 (severe): Secondary | ICD-10-CM

## 2020-09-28 DIAGNOSIS — E611 Iron deficiency: Secondary | ICD-10-CM

## 2020-09-28 DIAGNOSIS — N183 Chronic kidney disease, stage 3 unspecified: Secondary | ICD-10-CM | POA: Insufficient documentation

## 2020-09-28 DIAGNOSIS — D649 Anemia, unspecified: Secondary | ICD-10-CM

## 2020-09-28 DIAGNOSIS — E875 Hyperkalemia: Secondary | ICD-10-CM | POA: Diagnosis not present

## 2020-09-28 DIAGNOSIS — D631 Anemia in chronic kidney disease: Secondary | ICD-10-CM | POA: Insufficient documentation

## 2020-09-28 LAB — CBC WITH DIFFERENTIAL/PLATELET
Abs Immature Granulocytes: 0.08 10*3/uL — ABNORMAL HIGH (ref 0.00–0.07)
Basophils Absolute: 0 10*3/uL (ref 0.0–0.1)
Basophils Relative: 0 %
Eosinophils Absolute: 0 10*3/uL (ref 0.0–0.5)
Eosinophils Relative: 0 %
HCT: 28.8 % — ABNORMAL LOW (ref 36.0–46.0)
Hemoglobin: 8.6 g/dL — ABNORMAL LOW (ref 12.0–15.0)
Immature Granulocytes: 1 %
Lymphocytes Relative: 8 %
Lymphs Abs: 0.4 10*3/uL — ABNORMAL LOW (ref 0.7–4.0)
MCH: 28.3 pg (ref 26.0–34.0)
MCHC: 29.9 g/dL — ABNORMAL LOW (ref 30.0–36.0)
MCV: 94.7 fL (ref 80.0–100.0)
Monocytes Absolute: 0.3 10*3/uL (ref 0.1–1.0)
Monocytes Relative: 6 %
Neutro Abs: 5 10*3/uL (ref 1.7–7.7)
Neutrophils Relative %: 85 %
Platelets: 184 10*3/uL (ref 150–400)
RBC: 3.04 MIL/uL — ABNORMAL LOW (ref 3.87–5.11)
RDW: 16.3 % — ABNORMAL HIGH (ref 11.5–15.5)
WBC: 5.9 10*3/uL (ref 4.0–10.5)
nRBC: 0 % (ref 0.0–0.2)

## 2020-09-28 LAB — COMPREHENSIVE METABOLIC PANEL
ALT: 19 U/L (ref 0–44)
AST: 25 U/L (ref 15–41)
Albumin: 3.2 g/dL — ABNORMAL LOW (ref 3.5–5.0)
Alkaline Phosphatase: 53 U/L (ref 38–126)
Anion gap: 9 (ref 5–15)
BUN: 61 mg/dL — ABNORMAL HIGH (ref 8–23)
CO2: 30 mmol/L (ref 22–32)
Calcium: 9 mg/dL (ref 8.9–10.3)
Chloride: 98 mmol/L (ref 98–111)
Creatinine, Ser: 1.65 mg/dL — ABNORMAL HIGH (ref 0.44–1.00)
GFR, Estimated: 30 mL/min — ABNORMAL LOW (ref >60)
Glucose, Bld: 108 mg/dL — ABNORMAL HIGH (ref 70–99)
Potassium: 4.1 mmol/L (ref 3.5–5.1)
Sodium: 137 mmol/L (ref 135–145)
Total Bilirubin: 0.6 mg/dL (ref 0.3–1.2)
Total Protein: 7.4 g/dL (ref 6.5–8.1)

## 2020-09-28 LAB — VITAMIN D 25 HYDROXY (VIT D DEFICIENCY, FRACTURES): Vit D, 25-Hydroxy: 60.76 ng/mL (ref 30–100)

## 2020-09-28 LAB — VITAMIN B12: Vitamin B-12: 421 pg/mL (ref 180–914)

## 2020-09-28 LAB — FERRITIN: Ferritin: 290 ng/mL (ref 11–307)

## 2020-09-28 LAB — LACTATE DEHYDROGENASE: LDH: 187 U/L (ref 98–192)

## 2020-09-28 LAB — IRON AND TIBC
Iron: 42 ug/dL (ref 28–170)
Saturation Ratios: 15 % (ref 10.4–31.8)
TIBC: 272 ug/dL (ref 250–450)
UIBC: 230 ug/dL

## 2020-09-28 MED ORDER — HYDROCODONE-HOMATROPINE 5-1.5 MG/5ML PO SYRP: 5.0000 mL | ORAL_SOLUTION | Freq: Three times a day (TID) | ORAL | 0 refills | Status: DC | PRN

## 2020-09-28 MED ORDER — EPOETIN ALFA-EPBX 40000 UNIT/ML IJ SOLN
40000.0000 [IU] | Freq: Once | INTRAMUSCULAR | Status: AC
  Administered 2020-09-28: 40000 [IU] via SUBCUTANEOUS
  Filled 2020-09-28: qty 1

## 2020-09-28 NOTE — Progress Notes (Signed)
[place], [place] [address]   CLINIC:  Medical Oncology/Hematology  PCP:  Fayrene Helper, MD [address], [address] [place] [address] [phone]   REASON FOR VISIT: Follow-up for normocytic anemia   CURRENT THERAPY: Procrit injections every 4 weeks   INTERVAL HISTORY:  Ms. Lingle 87 y.o. female returns for routine follow-up for normocytic anemia secondary to CKD stage III.  She was last seen in clinic for assessment back in May 2020.  She receives Retacrit for hemoglobin less than 10 every 4 weeks.   She last received Retacrit on 06/11/2020.  Baseline hemoglobin is between 9-10.  She is followed by nephrology-Dr. Theador Hawthorne.  She was evaluated recently and found to have an elevated potassium level.  She was instructed to hold her potassium tablets.   She reports doing well today.  She is accompanied by her caregiver.  She is in a wheelchair.  Has chronic shortness of breath. Has a cough that is worse at bedtime.  Was evaluated in the emergency room in December 2021 with a negative chest x-ray.  Was given a prescription for [facility] which is helping some.  Denies any bleeding.  Denies any nausea, vomiting or diarrhea.  Denies any new pains. Had not noticed any recent bleeding such as epistaxis, hematuria or hematochezia. Denies recent chest pain on exertion, shortness of breath on minimal exertion, pre-syncopal episodes, or palpitations. Denies any numbness or tingling in hands or feet. Denies any recent fevers, infections, or recent hospitalizations. Patient reports appetite at 75% and energy level at 25%.  She is eating well maintain her weight at this time.  REVIEW OF SYSTEMS:  Review of Systems  Constitutional: Positive for fatigue.  Respiratory: Positive for cough and shortness of breath.   Neurological: Positive for dizziness.  All other systems reviewed and are negative.  PAST MEDICAL/SURGICAL HISTORY:  Past  Medical History:  Diagnosis Date  . Acute on chronic diastolic CHF (congestive heart failure) ([place]) 05/19/2012  . Acute on chronic respiratory failure with hypoxia ([place]) 01/31/2020  . Adenomatous polyp of colon 12/16/2002   Dr. [name]/[facility]  . Allergy   . Calculus of gallbladder with chronic cholecystitis without obstruction 02/09/2009   Qualifier: Diagnosis of  By: [name], [name]    . Cataract   . CHB (complete heart block) ([place]) 10/07/2014      . CHF (congestive heart failure) ([place])    a.  EF previously reduced to 25-30% b. EF improved to 60-65% by echo in 10/2017.   Marland Kitchen Chronic gout due to renal impairment    Per [facility] Records [facility]   . Chronic kidney disease, stage I 2006  . Cognitive communication deficit    Per [facility] Records [facility]   . Constipation       . Convulsion ([place])    Per [facility] Records [facility]   . COPD (chronic obstructive pulmonary disease) ([place])       . DJD (degenerative joint disease) of lumbar spine   . DM (diabetes mellitus) ([place]) 2006  . Dysphagia, unspecified    Per [facility] Records [facility]   . Esophageal reflux   . Glaucoma   . Gout       . H/O adenomatous polyp of colon 05/24/2012   2004 in [facility] colonoscopy 2008 normal    .  H/O heart valve replacement with mechanical valve    a. s/p mechanical AVR in 2001 and mechanical MVR in 2004.  Marland Kitchen H/O wisdom tooth extraction   . HIP PAIN, RIGHT 04/23/2009   Qualifier: Diagnosis of  By: [name], [name]    . Hyperkalemia    Per [facility] Records [facility]   . Hyperlipemia   . Hypertensive heart and kidney disease with HF and with CKD stage I-IV ([place])    Per [facility] Records [facility]   . IDA (iron deficiency anemia)     Parenteral iron/[name]  . Insomnia       . Localized edema    Left arm, Per [facility] Records [facility]   . Long term (current) use of anticoagulants    Per [facility] Records [facility]   . Nausea without vomiting 06/07/2010   Qualifier: Diagnosis of  By: [name], [name]    . Non-ischemic cardiomyopathy ([place])    a. s/p MDT CRTD  . Obesity, unspecified   . Oxygen deficiency 2014   nocturnal;  . Papilloma of breast 03/06/2012   This was diagnosed as a left breast papilloma by ultrasound characteristics and symptoms in 2010. Because of possible medical risk factors no surgical intervention has been done and it did doing annual followups. The area has been stable by physical examination and mammograms and ultrasound since 2010.   . Presence of cardiac pacemaker    Per [facility] Electronic [facility] Records [facility]   . Spinal stenosis of lumbar region 02/19/2013  . Spondylolisthesis   . Spondylosis   . Thyroid cancer ([place])    remote thyroidectomy, no recurrence, pt denies in 2016 thyroid cancer  . Type 2 diabetes mellitus with hyperglycemia ([place]) 07/15/2010  . Type 2 diabetes mellitus with hypoglycemia without coma ([place])    Per [facility] Records [facility]   . Unspecified chronic bronchitis ([place])    Per [facility] Records [facility]   . Unspecified hypothyroidism       . Urinary tract infection, site not specified    Per [facility] Records [facility]   . 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 Vertigo        Past Surgical History:  Procedure Laterality Date  . AORTIC AND MITRAL VALVE REPLACEMENT  2001  . BI-VENTRICULAR IMPLANTABLE CARDIOVERTER DEFIBRILLATOR UPGRADE N/A 01/18/2015   Procedure: BI-VENTRICULAR IMPLANTABLE CARDIOVERTER DEFIBRILLATOR UPGRADE;   Surgeon: Evans Lance, MD;  Location: [facility] CATH LAB;  Service: Cardiovascular;  Laterality: N/A;  . BIV ICD GENERATOR CHANGEOUT N/A 01/30/2019   Procedure: BIV ICD GENERATOR CHANGEOUT;  Surgeon: Evans Lance, MD;  Location: [facility] CV LAB;  Service: Cardiovascular;  Laterality: N/A;  . CARDIAC CATHETERIZATION    . CARDIAC VALVE REPLACEMENT    . CARDIOVERSION N/A 11/13/2016   Procedure: CARDIOVERSION;  Surgeon: Sanda Klein, MD;  Location: [facility] ENDOSCOPY;  Service: Cardiovascular;  Laterality: N/A;  . COLONOSCOPY  06/12/2007   Rourk- Normal rectum/Normal colon  . COLONOSCOPY  12/16/02   small hemorrhoids  . COLONOSCOPY  06/20/2012   Procedure: COLONOSCOPY;  Surgeon: Daneil Dolin, MD;  Location: [facility] ENDO SUITE;  Service: Endoscopy;  Laterality: N/A;  10:45  . defibrillator implanted 2006    . DOPPLER ECHOCARDIOGRAPHY  2012  . DOPPLER ECHOCARDIOGRAPHY  05,06,07,08,09,2011  .  ESOPHAGOGASTRODUODENOSCOPY  06/12/2007   Rourk- normal esophagus, small hiatal hernia, otherwise normal stomach, D1, D2  . EYE SURGERY Right 2005  . EYE SURGERY Left 2006  . ICD LEAD REMOVAL Left 02/08/2015   Procedure: ICD LEAD REMOVAL;  Surgeon: Evans Lance, MD;  Location: [facility] OR;  Service: Cardiovascular;  Laterality: Left;  "Will plan extraction and insertion of a BiV PM"  **Dr. Roxan Hockey backing up case**  . IMPLANTABLE CARDIOVERTER DEFIBRILLATOR (ICD) GENERATOR CHANGE Left 02/08/2015   Procedure: ICD GENERATOR CHANGE;  Surgeon: Evans Lance, MD;  Location: [facility];  Service: Cardiovascular;  Laterality: Left;  . INSERT / REPLACE / REMOVE PACEMAKER    . PACEMAKER INSERTION  May 2016  . pacemaker placed  2004  . right breast cyst removed     benign   . right cataract removed     2005  . TEE WITHOUT CARDIOVERSION N/A 11/13/2016   Procedure: TRANSESOPHAGEAL ECHOCARDIOGRAM (TEE);  Surgeon: Sanda Klein, MD;  [facility] END[facility]COPY;  Service: Cardiovascular;  Laterality: N/A;  . THYROIDECTOMY        SOCIAL HISTORY:  Social History   Socioeconomic History  . Marital status: Widowed    Spouse name: Not on file  . Number of children: 0  . Years of education: Not on file  . Highest education level: Not on file  Occupational History  . Occupation: retired    Fish farm manager: RETIRED  Tobacco Use  . Smoking status: Former Smoker    Packs/day: 0.75    Years: 40.00    Pack years: 30.00    Types: Cigarettes    Quit date: 03/08/1987    Years since quitting: 33.5  . Smokeless tobacco: Never Used  Vaping Use  . Vaping Use: Never used  Substance and Sexual Activity  . Alcohol use: No    Alcohol/week: 0.0 standard drinks  . Drug use: No  . Sexual activity: Not Currently  Other Topics Concern  . Not on file  Social History Narrative   From [place] (2004)   Social Determinants of Health   Financial Resource Strain: Low Risk   . Difficulty of Paying Living Expenses: Not hard at all  Food Insecurity: No Food Insecurity  . Worried About Charity fundraiser in the Last Year: Never true  . Ran Out of Food in the Last Year: Never true  Transportation Needs: No Transportation Needs  . Lack of Transportation (Medical): No  . Lack of Transportation (Non-Medical): No  Physical Activity: Inactive  . Days of Exercise per Week: 0 days  . Minutes of Exercise per Session: 0 min  Stress: No Stress Concern Present  . Feeling of Stress : Only a little  Social Connections: Moderately Isolated  . Frequency of Communication with Friends and Family: More than three times a week  . Frequency of Social Gatherings with Friends and Family: Twice a week  . Attends Religious Services: More than 4 times per year  . Active Member of Clubs or Organizations: No  . Attends Archivist Meetings: Never  . Marital Status: Widowed  Intimate Partner Violence: Not on file    FAMILY HISTORY:  Family History  Problem Relation Age of Onset  . Pancreatic cancer Mother   . Heart disease Father   .  Heart disease Sister   . Heart attack Sister   . Heart disease Brother   . Diabetes Brother   . Heart disease Brother   . Diabetes Brother   . Hypertension Brother   .  Lung cancer Brother     CURRENT MEDICATIONS:  Outpatient Encounter Medications as of 09/28/2020  Medication Sig Note  . albuterol (VENTOLIN HFA) 108 (90 Base) MCG/ACT inhaler Inhale 1 puff into the lungs every 6 (six) hours as needed for wheezing or shortness of breath.   . allopurinol (ZYLOPRIM) 100 MG tablet TAKE ONE TABLET BY MOUTH ONCE DAILY.   Marland Kitchen BREO ELLIPTA 100-25 MCG/INH AEPB INHALE (1) PUFF BY MOUTH INTO THE LUNGS DAILY   . cholecalciferol (VITAMIN D3) 25 MCG (1000 UNIT) tablet Take 1 tablet (1,000 Units total) by mouth in the morning.   . fluticasone (FLONASE) 50 MCG/ACT nasal spray Place 2 sprays into both nostrils daily.   . folic acid (FOLVITE) 1 MG tablet TAKE ONE TABLET BY MOUTH ONCE DAILY.   . furosemide (LASIX) 40 MG tablet TAKE ONE TABLET BY MOUTH ONCE DAILY.   Marland Kitchen gabapentin (NEURONTIN) 300 MG capsule TAKE (1) CAPSULE BY MOUTH TWICE DAILY   . hydrALAZINE (APRESOLINE) 25 MG tablet Take 25 mg by mouth 2 (two) times daily.   Marland Kitchen levETIRAcetam (KEPPRA) 500 MG tablet TAKE (1) TABLET BY MOUTH TWICE DAILY AT 9AM AND 9PM   . levofloxacin (LEVAQUIN) 500 MG tablet Take 1 tablet (500 mg total) by mouth daily. 09/22/2020: Reports that she has confirmed that her outpatient pharmacy will deliver to her today  . Liniments (SALONPAS PAIN RELIEF PATCH EX) Apply 1 patch topically daily as needed (pain).    . metoprolol tartrate (LOPRESSOR) 50 MG tablet TAKE (1) TABLET BY MOUTH TWICE DAILY AT 9AM AND 9PM.   . nitroGLYCERIN (NITROSTAT) 0.4 MG SL tablet DISSOLVE 1 TABLET SUBLINGUALLY AS NEEDED FOR CHEST PAIN, MAY REPEAT EVERY 5 MINUTES. AFTER 3 CALL 911.   . NON FORMULARY Diet Change: Soft diet with dys 2 (ground) meats, thin liquids (NAS, cons CHO, heart healthy)   . ondansetron (ZOFRAN) 4 MG tablet Take by mouth at bedtime.   .  OXYGEN Inhale 3 L into the lungs continuous.   [name] [name] (SYSTANE) 0.4-0.3 % SOLN Apply 1 drop to eye at bedtime. Left eye   . potassium chloride SA (KLOR-CON) 20 MEQ tablet TAKE ONE TABLET BY MOUTH ONCE DAILY.   . pravastatin (PRAVACHOL) 40 MG tablet TAKE ONE TABLET BY MOUTH ONCE DAILY.   Marland Kitchen predniSONE (DELTASONE) 20 MG tablet Take 1 tablet (20 mg total) by mouth 2 (two) times daily. 09/22/2020: Reports took all doses as prescribed and completed course  . senna (SENOKOT) 8.6 MG TABS tablet Take 2 tablets (17.2 mg total) by mouth daily as needed for mild constipation.   Marland Kitchen SYNTHROID 88 MCG tablet TAKE 1 TABLET BEFORE BREAKFAST.   Marland Kitchen UNABLE TO FIND Compression Stockings R-  Ankle  26.5cm   Calf 38cm  Leg  44cm L-  Ankle  27.5 cm   Calf 39cm  Leg  44cm   . warfarin (COUMADIN) 10 MG tablet TAKE (1) TABLET BY MOUTH DAILY AT 4PM   . acetaminophen (TYLENOL) 500 MG tablet Take 1 tablet (500 mg total) by mouth every 6 (six) hours as needed (pain). (Patient not taking: Reported on 09/28/2020)   . albuterol (PROVENTIL) (2.5 MG/3ML) 0.083% nebulizer solution Take 3 mLs (2.5 mg total) by nebulization in the morning and at bedtime. *May use additional one time if needed for shortness of breath (Patient not taking: Reported on 09/28/2020)   . benzonatate (TESSALON) 100 MG capsule Take 1 capsule (100 mg total) by mouth 2 (two) times daily as needed  for cough. (Patient not taking: Reported on 09/28/2020) 09/22/2020: Reports she has confirmed that her outpatient pharmacy will deliver to her today  . ondansetron (ZOFRAN-ODT) 4 MG disintegrating tablet TAKE (1) TABLET BY MOUTH EVERY EIGHT HOURS AS NEEDED FOR NAUSEA OR VOMITING (Patient not taking: Reported on 09/28/2020)   . polyethylene glycol (MIRALAX / GLYCOLAX) 17 g packet Take 17 g by mouth daily as needed. (Patient not taking: Reported on 09/28/2020)   . [DISCONTINUED] doxycycline (VIBRAMYCIN) 100 MG capsule Take 1 capsule (100 mg total) by mouth 2  (two) times daily. One po bid x 7 days 09/22/2020: Reports took all doses as prescribed and completed   Facility-Administered Encounter Medications as of 09/28/2020  Medication  . epoetin alfa (EPOGEN,PROCRIT) injection 40,000 Units    ALLERGIES:  Allergies  Allergen Reactions  . Penicillins Other (See Comments)    Bruise    . Aspirin Other (See Comments)    On coumadin  . Fentanyl Dermatitis    Rash with patch   . Niacin Itching     PHYSICAL EXAM:  ECOG Performance status: 1  Vitals:   09/28/20 1301  BP: 133/72  Pulse: 62  Resp: 18  Temp: (!) 96.8 F (36 C)  SpO2: 95%   Filed Weights   09/28/20 1301  Weight: 190 lb (86.2 kg)   Physical Exam Constitutional:      Appearance: Normal appearance. She is normal weight.  Cardiovascular:     Rate and Rhythm: Normal rate and regular rhythm.     Heart sounds: Normal heart sounds.  Pulmonary:     Effort: Pulmonary effort is normal.     Breath sounds: Normal breath sounds.  Abdominal:     General: Bowel sounds are normal.     Palpations: Abdomen is soft.  Musculoskeletal:        General: Normal range of motion.  Skin:    General: Skin is warm.  Neurological:     Mental Status: She is alert and oriented to person, place, and time. Mental status is at baseline.  Psychiatric:        Mood and Affect: Mood normal.        Behavior: Behavior normal.        Thought Content: Thought content normal.        Judgment: Judgment normal.      LABORATORY DATA:  I have reviewed the labs as listed.  CBC    Component Value Date/Time   WBC 5.9 09/28/2020 1219   RBC 3.04 (L) 09/28/2020 1219   HGB 8.6 (L) 09/28/2020 1219   HGB 9.6 (L) 01/28/2019 1218   HCT 28.8 (L) 09/28/2020 1219   HCT 29.9 (L) 01/28/2019 1218   PLT 184 09/28/2020 1219   PLT 199 01/28/2019 1218   MCV 94.7 09/28/2020 1219   MCV 90 01/28/2019 1218   MCH 28.3 09/28/2020 1219   MCHC 29.9 (L) 09/28/2020 1219   RDW 16.3 (H) 09/28/2020 1219   RDW 17.1 (H)  01/28/2019 1218   LYMPHSABS 0.4 (L) 09/28/2020 1219   LYMPHSABS 0.7 01/28/2019 1218   MONOABS 0.3 09/28/2020 1219   EOSABS 0.0 09/28/2020 1219   EOSABS 0.0 01/28/2019 1218   BASOSABS 0.0 09/28/2020 1219   BASOSABS 0.0 01/28/2019 1218   CMP Latest Ref Rng & Units 09/28/2020 09/23/2020 09/10/2020  Glucose 70 - 99 mg/dL 108(H) 101(H) 90  BUN 8 - 23 mg/dL 61(H) 54(H) 51(H)  Creatinine 0.44 - 1.00 mg/dL 1.65(H) 1.51(H) 1.46(H)  Sodium 135 - 145  mmol/L 137 141 140  Potassium 3.5 - 5.1 mmol/L 4.1 5.8(H) 5.4(H)  Chloride 98 - 111 mmol/L 98 103 105  CO2 22 - 32 mmol/L 30 29 28   Calcium 8.9 - 10.3 mg/dL 9.0 9.1 8.8(L)  Total Protein 6.5 - 8.1 g/dL 7.4 - -  Total Bilirubin 0.3 - 1.2 mg/dL 0.6 - -  Alkaline Phos 38 - 126 U/L 53 - -  AST 15 - 41 U/L 25 - -  ALT 0 - 44 U/L 19 - -    All questions were answered to patient's stated satisfaction. Encouraged patient to call with any new concerns or questions before his next visit to the cancer center and we can certain see him sooner, if needed.     ASSESSMENT & PLAN:  1.  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   -Has been intermittently on Procrit/Retacrit for greater than 4 years. -She is followed by nephrology but receives injections with [place]. -Currently gets 40,000 units every 4 to 6 weeks. -Baseline hemoglobin between 8 and 9. -Labs from today show hemoglobin 8.6, ferritin of 290, saturation ratio is 50% and normal B12 levels. -Continue oral iron as prescribed. -She will receive Retacrit today and return to clinic in 4 weeks for repeat labs and possible Retacrit.  2. CKD stage III -Creatinine is 1.65 today.  Overall stable. -Continue follow-up with nephrology  3.  Cough: -With recently evaluated at [facility] and had chest x-ray which did not reveal any cardiopulmonary disease. -She was started on Tessalon Perles without any improvement of her cough. -Can try  Hycodan cough syrup at bedtime. RX sent to pharmacy.   4.  Hyperkalemia: -Noted to have a elevated potassium level at lab draw with nephrology. -Potassium was 5.8. -She was instructed to hold her potassium supplements. -Potassium has improved and is 4.1 today.  She has follow-up with Dr. Theador Hawthorne next week.  Disposition: -Retacrit injection today. -Return to clinic in 4 weeks for lab work and possible [place]. -RTC in 6 months for MD assessment.   No problem-specific Assessment & Plan notes found for this encounter.  Greater than 50% was spent in counseling and coordination of care with this patient including but not limited to discussion of the relevant topics above (See A&P) including, but not limited to diagnosis and management of acute and chronic medical conditions.    Orders placed this encounter:  No orders of the defined types were placed in this encounter.  Faythe Casa, NP 09/28/2020 7:27 PM  [place] [phone]

## 2020-09-28 NOTE — Progress Notes (Signed)
Patient tolerated Retacrit injection with no complaints voiced.  Site clean and dry with no bruising or swelling noted.  No complaints of pain.  Discharged with vital signs stable and no signs or symptoms of distress noted.  

## 2020-09-29 ENCOUNTER — Telehealth (HOSPITAL_COMMUNITY): Payer: Self-pay | Admitting: *Deleted

## 2020-09-29 DIAGNOSIS — I4819 Other persistent atrial fibrillation: Secondary | ICD-10-CM | POA: Diagnosis not present

## 2020-09-29 DIAGNOSIS — E1122 Type 2 diabetes mellitus with diabetic chronic kidney disease: Secondary | ICD-10-CM | POA: Diagnosis not present

## 2020-09-29 DIAGNOSIS — I130 Hypertensive heart and chronic kidney disease with heart failure and stage 1 through stage 4 chronic kidney disease, or unspecified chronic kidney disease: Secondary | ICD-10-CM | POA: Diagnosis not present

## 2020-09-29 DIAGNOSIS — N179 Acute kidney failure, unspecified: Secondary | ICD-10-CM | POA: Diagnosis not present

## 2020-09-29 DIAGNOSIS — N184 Chronic kidney disease, stage 4 (severe): Secondary | ICD-10-CM | POA: Diagnosis not present

## 2020-09-29 DIAGNOSIS — I5043 Acute on chronic combined systolic (congestive) and diastolic (congestive) heart failure: Secondary | ICD-10-CM | POA: Diagnosis not present

## 2020-10-04 ENCOUNTER — Ambulatory Visit (INDEPENDENT_AMBULATORY_CARE_PROVIDER_SITE_OTHER): Payer: Medicare Other

## 2020-10-04 ENCOUNTER — Telehealth: Payer: Self-pay | Admitting: *Deleted

## 2020-10-04 ENCOUNTER — Ambulatory Visit (INDEPENDENT_AMBULATORY_CARE_PROVIDER_SITE_OTHER): Payer: Medicare Other | Admitting: *Deleted

## 2020-10-04 DIAGNOSIS — Z5181 Encounter for therapeutic drug level monitoring: Secondary | ICD-10-CM | POA: Diagnosis not present

## 2020-10-04 DIAGNOSIS — I5043 Acute on chronic combined systolic (congestive) and diastolic (congestive) heart failure: Secondary | ICD-10-CM | POA: Diagnosis not present

## 2020-10-04 DIAGNOSIS — Z952 Presence of prosthetic heart valve: Secondary | ICD-10-CM | POA: Diagnosis not present

## 2020-10-04 DIAGNOSIS — N184 Chronic kidney disease, stage 4 (severe): Secondary | ICD-10-CM | POA: Diagnosis not present

## 2020-10-04 DIAGNOSIS — N179 Acute kidney failure, unspecified: Secondary | ICD-10-CM | POA: Diagnosis not present

## 2020-10-04 DIAGNOSIS — I130 Hypertensive heart and chronic kidney disease with heart failure and stage 1 through stage 4 chronic kidney disease, or unspecified chronic kidney disease: Secondary | ICD-10-CM | POA: Diagnosis not present

## 2020-10-04 DIAGNOSIS — I4819 Other persistent atrial fibrillation: Secondary | ICD-10-CM | POA: Diagnosis not present

## 2020-10-04 DIAGNOSIS — E1122 Type 2 diabetes mellitus with diabetic chronic kidney disease: Secondary | ICD-10-CM | POA: Diagnosis not present

## 2020-10-04 DIAGNOSIS — I5042 Chronic combined systolic (congestive) and diastolic (congestive) heart failure: Secondary | ICD-10-CM

## 2020-10-04 DIAGNOSIS — Z95810 Presence of automatic (implantable) cardiac defibrillator: Secondary | ICD-10-CM | POA: Diagnosis not present

## 2020-10-04 LAB — POCT INR: INR: 1.9 — AB (ref 2.0–3.0)

## 2020-10-04 NOTE — Telephone Encounter (Signed)
PT 22.8 INR  1.9  Please call Kristi with [facility]  [phone]

## 2020-10-04 NOTE — Patient Instructions (Signed)
Spoke with [facility] [place]. Pt has 10mg  tablets of warfarin on hand. Pt to take 1 tablet tonight then resume 1 tablet  (10mg ) daily except for 1/2 a tablet (5mg )  on Monday, Wednesday and Friday. [place] [facility] will recheck INR on 10/11/2020 .  Finished Levaquin

## 2020-10-04 NOTE — Telephone Encounter (Signed)
Called [name] [name] with warfarin instructions and she verbalized understanding.

## 2020-10-06 ENCOUNTER — Other Ambulatory Visit: Payer: Self-pay | Admitting: Family Medicine

## 2020-10-06 DIAGNOSIS — N179 Acute kidney failure, unspecified: Secondary | ICD-10-CM | POA: Diagnosis not present

## 2020-10-06 DIAGNOSIS — N184 Chronic kidney disease, stage 4 (severe): Secondary | ICD-10-CM | POA: Diagnosis not present

## 2020-10-06 DIAGNOSIS — I4819 Other persistent atrial fibrillation: Secondary | ICD-10-CM | POA: Diagnosis not present

## 2020-10-06 DIAGNOSIS — I130 Hypertensive heart and chronic kidney disease with heart failure and stage 1 through stage 4 chronic kidney disease, or unspecified chronic kidney disease: Secondary | ICD-10-CM | POA: Diagnosis not present

## 2020-10-06 DIAGNOSIS — I5043 Acute on chronic combined systolic (congestive) and diastolic (congestive) heart failure: Secondary | ICD-10-CM | POA: Diagnosis not present

## 2020-10-06 DIAGNOSIS — E1122 Type 2 diabetes mellitus with diabetic chronic kidney disease: Secondary | ICD-10-CM | POA: Diagnosis not present

## 2020-10-06 NOTE — Progress Notes (Signed)
EPIC Encounter for ICM Monitoring  Patient Name: [name] [name] is a 87 y.o. female Date: 10/06/2020 Primary Care Physican: Fayrene Helper, MD Primary Cardiologist:Croitoru Electrophysiologist:Taylor Bi-V Pacing:99.8% 08/24/2020 Office Weight: 199 lbs     Transmission reviewed.  Optivol thoracic impedancenormal.  Prescribed:  Furosemide40 mg1tablet (40 mg total) daily.   Potassium 20 mEq take 1 tablet daily.  Labs: 09/28/2020 Creatinine 1.65, BUN 31, Potassium 4.1, Sodium 137, GFR 30  09/23/2020 Creatinine 1.51, BUN 54, Potassium 5.8, Sodium 141, GFR 33  09/10/2020 Creatinine 1.46, BUN 51, Potassium 5.4, Sodium 140, GFR 35  08/09/2020 Creatinine 1.44, BUN 40, Potassium 4.9, Sodium 137 07/24/2020 Creatinine 1.27, BUN 41, Potassium 4.8, Sodium 133 07/23/2020 Creatinine 1.32, BUN 43, Potassium 5.9, Sodium 143, GFR 37-42  07/08/2020 Creatinine 1.83, BUN 65, Potassium 4.2, Sodium 137 A complete set of results can be found in Results Review.  RecommendationsNo changes.  Follow-up plan: ICM clinic phone appointment on2/21/2022.91 day device clinic remote transmission2/18/2022.  EP/Cardiology Office Visits:2/2/2022with Dr Sallyanne Kuster.   Copy of ICM check sent to Dr.Taylor   3 month ICM trend: 10/04/2020.    1 Year ICM trend:       [name], RN 10/06/2020 11:29 AM

## 2020-10-07 ENCOUNTER — Other Ambulatory Visit: Payer: Self-pay | Admitting: Family Medicine

## 2020-10-07 ENCOUNTER — Other Ambulatory Visit (HOSPITAL_COMMUNITY)
Admission: RE | Admit: 2020-10-07 | Discharge: 2020-10-07 | Disposition: A | Discharge status: Home / Self Care | Payer: Medicare Other | Source: Ambulatory Visit | Attending: Nephrology | Admitting: Nephrology

## 2020-10-07 ENCOUNTER — Other Ambulatory Visit: Payer: Self-pay

## 2020-10-07 DIAGNOSIS — D631 Anemia in chronic kidney disease: Secondary | ICD-10-CM | POA: Diagnosis not present

## 2020-10-07 DIAGNOSIS — N1832 Chronic kidney disease, stage 3b: Secondary | ICD-10-CM | POA: Diagnosis not present

## 2020-10-07 DIAGNOSIS — N189 Chronic kidney disease, unspecified: Secondary | ICD-10-CM | POA: Diagnosis not present

## 2020-10-07 DIAGNOSIS — E875 Hyperkalemia: Secondary | ICD-10-CM | POA: Diagnosis not present

## 2020-10-07 DIAGNOSIS — I129 Hypertensive chronic kidney disease with stage 1 through stage 4 chronic kidney disease, or unspecified chronic kidney disease: Secondary | ICD-10-CM | POA: Diagnosis not present

## 2020-10-07 LAB — RENAL FUNCTION PANEL
Albumin: 3.1 g/dL — ABNORMAL LOW (ref 3.5–5.0)
Anion gap: 7 (ref 5–15)
BUN: 67 mg/dL — ABNORMAL HIGH (ref 8–23)
CO2: 31 mmol/L (ref 22–32)
Calcium: 8.9 mg/dL (ref 8.9–10.3)
Chloride: 100 mmol/L (ref 98–111)
Creatinine, Ser: 1.87 mg/dL — ABNORMAL HIGH (ref 0.44–1.00)
GFR, Estimated: 26 mL/min — ABNORMAL LOW (ref >60)
Glucose, Bld: 111 mg/dL — ABNORMAL HIGH (ref 70–99)
Phosphorus: 3.9 mg/dL (ref 2.5–4.6)
Potassium: 4.5 mmol/L (ref 3.5–5.1)
Sodium: 138 mmol/L (ref 135–145)

## 2020-10-08 DIAGNOSIS — D631 Anemia in chronic kidney disease: Secondary | ICD-10-CM | POA: Diagnosis not present

## 2020-10-08 DIAGNOSIS — I129 Hypertensive chronic kidney disease with stage 1 through stage 4 chronic kidney disease, or unspecified chronic kidney disease: Secondary | ICD-10-CM | POA: Diagnosis not present

## 2020-10-08 DIAGNOSIS — N179 Acute kidney failure, unspecified: Secondary | ICD-10-CM | POA: Diagnosis not present

## 2020-10-08 DIAGNOSIS — I5043 Acute on chronic combined systolic (congestive) and diastolic (congestive) heart failure: Secondary | ICD-10-CM | POA: Diagnosis not present

## 2020-10-08 DIAGNOSIS — N1832 Chronic kidney disease, stage 3b: Secondary | ICD-10-CM | POA: Diagnosis not present

## 2020-10-08 DIAGNOSIS — N189 Chronic kidney disease, unspecified: Secondary | ICD-10-CM | POA: Diagnosis not present

## 2020-10-08 DIAGNOSIS — E875 Hyperkalemia: Secondary | ICD-10-CM | POA: Diagnosis not present

## 2020-10-12 ENCOUNTER — Telehealth: Payer: Self-pay | Admitting: Cardiovascular Disease

## 2020-10-12 ENCOUNTER — Ambulatory Visit (INDEPENDENT_AMBULATORY_CARE_PROVIDER_SITE_OTHER): Payer: Medicare Other | Admitting: Pharmacist

## 2020-10-12 DIAGNOSIS — Z952 Presence of prosthetic heart valve: Secondary | ICD-10-CM | POA: Diagnosis not present

## 2020-10-12 DIAGNOSIS — I130 Hypertensive heart and chronic kidney disease with heart failure and stage 1 through stage 4 chronic kidney disease, or unspecified chronic kidney disease: Secondary | ICD-10-CM | POA: Diagnosis not present

## 2020-10-12 DIAGNOSIS — Z5181 Encounter for therapeutic drug level monitoring: Secondary | ICD-10-CM | POA: Diagnosis not present

## 2020-10-12 DIAGNOSIS — I4819 Other persistent atrial fibrillation: Secondary | ICD-10-CM | POA: Diagnosis not present

## 2020-10-12 DIAGNOSIS — E1122 Type 2 diabetes mellitus with diabetic chronic kidney disease: Secondary | ICD-10-CM | POA: Diagnosis not present

## 2020-10-12 DIAGNOSIS — I5043 Acute on chronic combined systolic (congestive) and diastolic (congestive) heart failure: Secondary | ICD-10-CM | POA: Diagnosis not present

## 2020-10-12 DIAGNOSIS — N179 Acute kidney failure, unspecified: Secondary | ICD-10-CM | POA: Diagnosis not present

## 2020-10-12 DIAGNOSIS — N184 Chronic kidney disease, stage 4 (severe): Secondary | ICD-10-CM | POA: Diagnosis not present

## 2020-10-12 LAB — POCT INR: INR: 4.2 — AB (ref 2.0–3.0)

## 2020-10-12 NOTE — Patient Instructions (Addendum)
Description   Spoke with [facility]. Colletta Maryland noticed pill pack was filled incorrectly.  Was filled with 10mg  tablet every day.  Instructed to hold dose today and tomorrow and then resume 1 tablet  (10mg ) daily except for 1/2 a tablet (5mg )  on Monday, Wednesday and Friday. [facility] will recheck INR on 10/19/20  Colletta Maryland [facility]: [phone]

## 2020-10-12 NOTE — Telephone Encounter (Signed)
Called [facility]i at [facility].  No answer, left message on machine

## 2020-10-12 NOTE — Telephone Encounter (Signed)
protime 50.4  INR 4.2   From the notes she has pt was suppose to be taking 5mg  m wed Friday and 10 on Tuesday Thursday Saturday and Sunday and the pharmacy has her taking 10 every day in the prepackaged meds from pharmacy

## 2020-10-12 NOTE — Telephone Encounter (Signed)
Called wrong nurse.  Spoke with Colletta Maryland at [phone].  Please see anticoag encounter

## 2020-10-12 NOTE — Progress Notes (Signed)
Called and left message on machine.

## 2020-10-13 DIAGNOSIS — E890 Postprocedural hypothyroidism: Secondary | ICD-10-CM | POA: Diagnosis not present

## 2020-10-13 DIAGNOSIS — I950 Idiopathic hypotension: Secondary | ICD-10-CM | POA: Diagnosis not present

## 2020-10-13 DIAGNOSIS — N184 Chronic kidney disease, stage 4 (severe): Secondary | ICD-10-CM | POA: Diagnosis not present

## 2020-10-13 DIAGNOSIS — H409 Unspecified glaucoma: Secondary | ICD-10-CM | POA: Diagnosis not present

## 2020-10-13 DIAGNOSIS — R1310 Dysphagia, unspecified: Secondary | ICD-10-CM | POA: Diagnosis not present

## 2020-10-13 DIAGNOSIS — D509 Iron deficiency anemia, unspecified: Secondary | ICD-10-CM | POA: Diagnosis not present

## 2020-10-13 DIAGNOSIS — M48061 Spinal stenosis, lumbar region without neurogenic claudication: Secondary | ICD-10-CM | POA: Diagnosis not present

## 2020-10-13 DIAGNOSIS — I8393 Asymptomatic varicose veins of bilateral lower extremities: Secondary | ICD-10-CM | POA: Diagnosis not present

## 2020-10-13 DIAGNOSIS — J449 Chronic obstructive pulmonary disease, unspecified: Secondary | ICD-10-CM | POA: Diagnosis not present

## 2020-10-13 DIAGNOSIS — I442 Atrioventricular block, complete: Secondary | ICD-10-CM | POA: Diagnosis not present

## 2020-10-13 DIAGNOSIS — I429 Cardiomyopathy, unspecified: Secondary | ICD-10-CM | POA: Diagnosis not present

## 2020-10-13 DIAGNOSIS — M47816 Spondylosis without myelopathy or radiculopathy, lumbar region: Secondary | ICD-10-CM | POA: Diagnosis not present

## 2020-10-13 DIAGNOSIS — I4819 Other persistent atrial fibrillation: Secondary | ICD-10-CM | POA: Diagnosis not present

## 2020-10-13 DIAGNOSIS — R569 Unspecified convulsions: Secondary | ICD-10-CM | POA: Diagnosis not present

## 2020-10-13 DIAGNOSIS — I130 Hypertensive heart and chronic kidney disease with heart failure and stage 1 through stage 4 chronic kidney disease, or unspecified chronic kidney disease: Secondary | ICD-10-CM | POA: Diagnosis not present

## 2020-10-13 DIAGNOSIS — J9621 Acute and chronic respiratory failure with hypoxia: Secondary | ICD-10-CM | POA: Diagnosis not present

## 2020-10-13 DIAGNOSIS — M4310 Spondylolisthesis, site unspecified: Secondary | ICD-10-CM | POA: Diagnosis not present

## 2020-10-13 DIAGNOSIS — K5900 Constipation, unspecified: Secondary | ICD-10-CM | POA: Diagnosis not present

## 2020-10-13 DIAGNOSIS — G4700 Insomnia, unspecified: Secondary | ICD-10-CM | POA: Diagnosis not present

## 2020-10-13 DIAGNOSIS — K219 Gastro-esophageal reflux disease without esophagitis: Secondary | ICD-10-CM | POA: Diagnosis not present

## 2020-10-13 DIAGNOSIS — E1122 Type 2 diabetes mellitus with diabetic chronic kidney disease: Secondary | ICD-10-CM | POA: Diagnosis not present

## 2020-10-13 DIAGNOSIS — N179 Acute kidney failure, unspecified: Secondary | ICD-10-CM | POA: Diagnosis not present

## 2020-10-13 DIAGNOSIS — E11649 Type 2 diabetes mellitus with hypoglycemia without coma: Secondary | ICD-10-CM | POA: Diagnosis not present

## 2020-10-13 DIAGNOSIS — E669 Obesity, unspecified: Secondary | ICD-10-CM | POA: Diagnosis not present

## 2020-10-13 DIAGNOSIS — I5043 Acute on chronic combined systolic (congestive) and diastolic (congestive) heart failure: Secondary | ICD-10-CM | POA: Diagnosis not present

## 2020-10-15 ENCOUNTER — Other Ambulatory Visit: Payer: Self-pay | Admitting: *Deleted

## 2020-10-15 DIAGNOSIS — I130 Hypertensive heart and chronic kidney disease with heart failure and stage 1 through stage 4 chronic kidney disease, or unspecified chronic kidney disease: Secondary | ICD-10-CM | POA: Diagnosis not present

## 2020-10-15 DIAGNOSIS — I5043 Acute on chronic combined systolic (congestive) and diastolic (congestive) heart failure: Secondary | ICD-10-CM | POA: Diagnosis not present

## 2020-10-15 DIAGNOSIS — N184 Chronic kidney disease, stage 4 (severe): Secondary | ICD-10-CM | POA: Diagnosis not present

## 2020-10-15 DIAGNOSIS — E1122 Type 2 diabetes mellitus with diabetic chronic kidney disease: Secondary | ICD-10-CM | POA: Diagnosis not present

## 2020-10-15 DIAGNOSIS — N179 Acute kidney failure, unspecified: Secondary | ICD-10-CM | POA: Diagnosis not present

## 2020-10-15 DIAGNOSIS — I4819 Other persistent atrial fibrillation: Secondary | ICD-10-CM | POA: Diagnosis not present

## 2020-10-15 NOTE — Patient Outreach (Signed)
[facility]) Care Management [place] Coordination  10/15/2020  [name] [name] [birth year] [id number]  [facility] CM Outreach Care Coordination re:   [name] [name], 87 y/o female referred to [facility] CM 08/13/20 by [facility] RN PAC after patient had recenthospitalization October 5- 14, 2054for SOB/ pulmonary edema/ acute-on-chronic CHF exacerbation; during her hospitalization, patient had seizure event which complicated her hospitalization; she was discharged from hospital to SNF for rehabilitation and was subsequently discharged from SNF to home/ self-care with home health services in place on August 12, 2020.   Received request from [facility] leadership 10/13/20 to begin transition of pending patient care coordination team; sent note via secure messaging in EHR to assigned practice RN CM for future/ ongoing outreach.  [name], RN, BSN, [employer] [facility] Care Management  [phone]

## 2020-10-19 ENCOUNTER — Ambulatory Visit (INDEPENDENT_AMBULATORY_CARE_PROVIDER_SITE_OTHER): Payer: Medicare Other | Admitting: *Deleted

## 2020-10-19 ENCOUNTER — Telehealth: Payer: Self-pay | Admitting: *Deleted

## 2020-10-19 DIAGNOSIS — N184 Chronic kidney disease, stage 4 (severe): Secondary | ICD-10-CM | POA: Diagnosis not present

## 2020-10-19 DIAGNOSIS — I130 Hypertensive heart and chronic kidney disease with heart failure and stage 1 through stage 4 chronic kidney disease, or unspecified chronic kidney disease: Secondary | ICD-10-CM | POA: Diagnosis not present

## 2020-10-19 DIAGNOSIS — I4819 Other persistent atrial fibrillation: Secondary | ICD-10-CM | POA: Diagnosis not present

## 2020-10-19 DIAGNOSIS — Z952 Presence of prosthetic heart valve: Secondary | ICD-10-CM

## 2020-10-19 DIAGNOSIS — N179 Acute kidney failure, unspecified: Secondary | ICD-10-CM | POA: Diagnosis not present

## 2020-10-19 DIAGNOSIS — E1122 Type 2 diabetes mellitus with diabetic chronic kidney disease: Secondary | ICD-10-CM | POA: Diagnosis not present

## 2020-10-19 DIAGNOSIS — Z5181 Encounter for therapeutic drug level monitoring: Secondary | ICD-10-CM

## 2020-10-19 DIAGNOSIS — I5043 Acute on chronic combined systolic (congestive) and diastolic (congestive) heart failure: Secondary | ICD-10-CM | POA: Diagnosis not present

## 2020-10-19 LAB — POCT INR: INR: 2.3 (ref 2.0–3.0)

## 2020-10-19 NOTE — Telephone Encounter (Signed)
INR  2.3 PT   28.2

## 2020-10-19 NOTE — Patient Instructions (Signed)
Spoke with [facility]. Continue warfarin 1 tablet  (10mg ) daily except for 1/2 a tablet (5mg )  on Monday, Wednesday and Friday. [place] will recheck INR on 10/26/20

## 2020-10-19 NOTE — Telephone Encounter (Signed)
Spoke with Jessica.  Coumadin orders given.  See coumadin note. 

## 2020-10-21 ENCOUNTER — Other Ambulatory Visit (HOSPITAL_COMMUNITY)
Admission: RE | Admit: 2020-10-21 | Discharge: 2020-10-21 | Disposition: A | Discharge status: Home / Self Care | Payer: Medicare Other | Source: Ambulatory Visit | Attending: Nephrology | Admitting: Nephrology

## 2020-10-21 ENCOUNTER — Other Ambulatory Visit: Payer: Self-pay

## 2020-10-21 DIAGNOSIS — I129 Hypertensive chronic kidney disease with stage 1 through stage 4 chronic kidney disease, or unspecified chronic kidney disease: Secondary | ICD-10-CM | POA: Diagnosis not present

## 2020-10-21 DIAGNOSIS — N1832 Chronic kidney disease, stage 3b: Secondary | ICD-10-CM | POA: Insufficient documentation

## 2020-10-21 DIAGNOSIS — E875 Hyperkalemia: Secondary | ICD-10-CM | POA: Diagnosis not present

## 2020-10-21 DIAGNOSIS — I5043 Acute on chronic combined systolic (congestive) and diastolic (congestive) heart failure: Secondary | ICD-10-CM | POA: Diagnosis not present

## 2020-10-21 DIAGNOSIS — N179 Acute kidney failure, unspecified: Secondary | ICD-10-CM | POA: Insufficient documentation

## 2020-10-21 DIAGNOSIS — D631 Anemia in chronic kidney disease: Secondary | ICD-10-CM | POA: Diagnosis not present

## 2020-10-21 LAB — RENAL FUNCTION PANEL
Albumin: 3 g/dL — ABNORMAL LOW (ref 3.5–5.0)
Anion gap: 7 (ref 5–15)
BUN: 56 mg/dL — ABNORMAL HIGH (ref 8–23)
CO2: 31 mmol/L (ref 22–32)
Calcium: 8.7 mg/dL — ABNORMAL LOW (ref 8.9–10.3)
Chloride: 98 mmol/L (ref 98–111)
Creatinine, Ser: 1.7 mg/dL — ABNORMAL HIGH (ref 0.44–1.00)
GFR, Estimated: 29 mL/min — ABNORMAL LOW (ref >60)
Glucose, Bld: 103 mg/dL — ABNORMAL HIGH (ref 70–99)
Phosphorus: 3.6 mg/dL (ref 2.5–4.6)
Potassium: 4.1 mmol/L (ref 3.5–5.1)
Sodium: 136 mmol/L (ref 135–145)

## 2020-10-22 DIAGNOSIS — I129 Hypertensive chronic kidney disease with stage 1 through stage 4 chronic kidney disease, or unspecified chronic kidney disease: Secondary | ICD-10-CM | POA: Diagnosis not present

## 2020-10-22 DIAGNOSIS — N189 Chronic kidney disease, unspecified: Secondary | ICD-10-CM | POA: Diagnosis not present

## 2020-10-22 DIAGNOSIS — N179 Acute kidney failure, unspecified: Secondary | ICD-10-CM | POA: Diagnosis not present

## 2020-10-22 DIAGNOSIS — I5043 Acute on chronic combined systolic (congestive) and diastolic (congestive) heart failure: Secondary | ICD-10-CM | POA: Diagnosis not present

## 2020-10-22 DIAGNOSIS — N1832 Chronic kidney disease, stage 3b: Secondary | ICD-10-CM | POA: Diagnosis not present

## 2020-10-22 DIAGNOSIS — D631 Anemia in chronic kidney disease: Secondary | ICD-10-CM | POA: Diagnosis not present

## 2020-10-25 ENCOUNTER — Other Ambulatory Visit: Payer: Self-pay | Admitting: Family Medicine

## 2020-10-25 ENCOUNTER — Other Ambulatory Visit (HOSPITAL_COMMUNITY): Payer: Self-pay | Admitting: *Deleted

## 2020-10-25 ENCOUNTER — Telehealth: Payer: Self-pay

## 2020-10-25 DIAGNOSIS — E611 Iron deficiency: Secondary | ICD-10-CM

## 2020-10-25 NOTE — Telephone Encounter (Signed)
I connected by phone with [name] [name] and/or patient's caregiver on 10/25/2020 at 9:21 AM to discuss the potential vaccination through our Homebound vaccination initiative.   Prevaccination Checklist for COVID-19 Vaccines  1.  Are you feeling sick today? no  2.  Have you ever received a dose of a COVID-19 vaccine?  yes      If yes, which one? Moderna   How many dose of Covid-19 vaccine have your received and dates ? 2, 12/04/2019, 01/06/2020.   Check all that apply: I live in a long-term care setting. no  I have been diagnosed with a medical condition(s). Please list: Pt is 87 years old, does not travel much, has a pacemaker and is on Coumadin (pertinent to homebound status)  I am a first responder. no  I work in a long-term care facility, correctional facility, hospital, restaurant, retail setting, school, or other setting with high exposure to the public. no  4. Do you have a health condition or are you undergoing treatment that makes you moderately or severely immunocompromised? (This would include treatment for cancer or HIV, receipt of organ transplant, immunosuppressive therapy or high-dose corticosteroids, CAR-T-cell therapy, hematopoietic cell transplant [HCT], DiGeorge syndrome or Wiskott-Aldrich syndrome)  no  5. Have you received hematopoietic cell transplant (HCT) or CAR-T-cell therapies since receiving COVID-19 vaccine? no  6.  Have you ever had an allergic reaction: (This would include a severe reaction [ e.g., anaphylaxis] that required treatment with epinephrine or EpiPen or that caused you to go to the hospital.  It would also include an allergic reaction that occurred within 4 hours that caused hives, swelling, or respiratory distress, including wheezing.) A.  A previous dose of COVID-19 vaccine. no  B.  A vaccine or injectable therapy that contains multiple components, one of which is a COVID-19 vaccine component, but it is not known which component elicited the immediate  reaction. no  C.  Are you allergic to polyethylene glycol? no  D. Are you allergic to Polysorbate, which is found in some vaccines, film coated tablets and intravenous steroids?  no   7.  Have you ever had an allergic reaction to another vaccine (other than COVID-19 vaccine) or an injectable medication? (This would include a severe reaction [ e.g., anaphylaxis] that required treatment with epinephrine or EpiPen or that caused you to go to the hospital.  It would also include an allergic reaction that occurred within 4 hours that caused hives, swelling, or respiratory distress, including wheezing.)  no   8.  Have you ever had a severe allergic reaction (e.g., anaphylaxis) to something other than a component of the COVID-19 vaccine, or any vaccine or injectable medication?  This would include food, pet, venom, environmental, or oral medication allergies.  no   Check all that apply to you:  Am a female between ages 77 and 90 years old  no  Women 58 through 85 years of age can receive any FDA-authorized or -approved COVID-19 vaccine. However, they should be informed of the rare but increased risk of thrombosis with thrombocytopenia syndrome (TTS) after receipt of the [employer] Vaccine and the availability of other FDA-authorized and -approved COVID-19 vaccines. People who had TTS after a first dose of Janssen vaccine should not receive a subsequent dose of Janssen product    Am a female between ages 91 and 29 years old  no Males 5 through 85 years of age may receive the correct formulation of Pfizer-BioNTech COVID-19 vaccine. Males 18 and older can receive  any FDA-authorized or -approved vaccine. However, people receiving an mRNA COVID-19 vaccine, especially males 66 through 85 years of age and their parents/legal representative (when relevant), should be informed of the risk of developing myocarditis (an inflammation of the heart muscle) or pericarditis (inflammation of the lining around the heart)  after receipt of an mRNA vaccine. The risk of developing either myocarditis or pericarditis after vaccination is low, and lower than the risk of myocarditis associated with SARS-CoV-2 infection in adolescents and adults. Vaccine recipients should be counseled about the need to seek care if symptoms of myocarditis or pericarditis develop after vaccination     Have a history of myocarditis or pericarditis  no Myocarditis or pericarditis after receipt of the first dose of an mRNA COVID-19 vaccine series but before administration of the second dose  Experts advise that people who develop myocarditis or pericarditis after a dose of an mRNA COVID-19 vaccine not receive a subsequent dose of any COVID-19 vaccine, until additional safety data are available.  Administration of a subsequent dose of COVID-19 vaccine before safety data are available can be considered in certain circumstances after the episode of myocarditis or pericarditis has completely resolved. Until additional data are available, some experts recommend a Alphonsa Overall COVID-19 vaccine be considered instead of an mRNA COVID-19 vaccine. Decisions about proceeding with a subsequent dose should include a conversation between the patient, their parent/legal representative (when relevant), and their clinical team, which may include a cardiologist.    Have been treated with monoclonal antibodies or convalescent serum to prevent or treat COVID-19  no Vaccination should be offered to people regardless of history of prior symptomatic or asymptomatic SARS-CoV-2 infection. There is no recommended minimal interval between infection and vaccination.  However, vaccination should be deferred if a patient received monoclonal antibodies or convalescent serum as treatment for COVID-19 or for post-exposure prophylaxis. This is a precautionary measure until additional information becomes available, to avoid interference of the antibody treatment with vaccine-induced immune  responses.  Defer COVID-19 vaccination for 30 days when a passive antibody product was used for post-exposure prophylaxis.  Defer COVID-19 vaccination for 90 days when a passive antibody product was used to treat COVID-19.     Diagnosed with Multisystem Inflammatory Syndrome (MIS-C or MIS-A) after a COVID-19 infection  no It is unknown if people with a history of MIS-C or MIS-A are at risk for a dysregulated immune response to COVID-19 vaccination.  People with a history of MIS-C or MIS-A may choose to be vaccinated. Considerations for vaccination may include:   Clinical recovery from MIS-C or MIS-A, including return to normal cardiac function   Personal risk of severe acute COVID-19 (e.g., age, underlying conditions)   High or substantial community transmission of SARS-CoV-2 and personal increased risk of reinfection.   Timing of any immunomodulatory therapies (general best practice guidelines for immunization can be consulted for more information [email or web address])   It has been 90 days or more since their diagnosis of MIS-C   Onset of MIS-C occurred before any COVID-19 vaccination   A conversation between the patient, their guardian(s), and their clinical team or a specialist may assist with COVID-19 vaccination decisions. Healthcare providers and health departments may also request a consultation from the [place] at [email or web address].     Have a bleeding disorder  no Take a blood thinner  Yes, Coumadin As with all vaccines, any COVID-19 vaccine product may be given to these patients, if a physician familiar with the patient's bleeding  risk determines that the vaccine can be administered intramuscularly with reasonable safety.  ACIP recommends the following technique for intramuscular vaccination in patients with bleeding disorders or taking blood  thinners: a fine-gauge needle (23-gauge or smaller caliber) should be used for the vaccination, followed by firm pressure on the site, without rubbing, for at least 2 minutes.  People who regularly take aspirin or anticoagulants as part of their routine medications do not need to stop these medications prior to receipt of any COVID-19 vaccine.    Have a history of heparin-induced thrombocytopenia (HIT)  no Although the etiology of TTS associated with the Alphonsa Overall COVID-19 vaccine is unclear, it appears to be similar to another rare immune-mediated syndrome, heparin-induced thrombocytopenia (HIT). People with a history of an episode of an immune-mediated syndrome characterized by thrombosis and thrombocytopenia, such as HIT, should be offered a currently FDA-approved or FDA-authorized mRNA COVID-19 vaccine if it has been ?90 days since their TTS resolved. After 90 days, patients may be vaccinated with any currently FDA-approved or FDA-authorized COVID-19 vaccine, including Janssen COVID-19 Vaccine. However, people who developed TTS after their initial Alphonsa Overall vaccine should not receive a Janssen booster dose.  Experts believe the following factors do not make people more susceptible to TTS after receipt of the [employer]. People with these conditions can be vaccinated with any FDA-authorized or - approved COVID-19 vaccine, including the [employer] COVID-19 Vaccine:   A prior history of venous thromboembolism   Risk factors for venous thromboembolism (e.g., inherited or acquired thrombophilia including Factor V Leiden; prothrombin gene 20210A mutation; antiphospholipid syndrome; protein C, protein S or antithrombin deficiency   A prior history of other types of thromboses not associated with thrombocytopenia   Pregnancy, post-partum status, or receipt of hormonal contraceptives (e.g., combined oral contraceptives, patch, ring)   Additional recipient education materials can be found at  [email or web address] vaccines/safety/JJUpdate.html.    Am currently pregnant or breastfeeding  no Vaccination is recommended for all people aged 67 years and older, including people that are:   Pregnant   Breastfeeding   Trying to get pregnant now or who might become pregnant in the future   Pregnant, breastfeeding, and post-partum people 30 through 85 years of age should be aware of the rare risk of TTS after receipt of the Alphonsa Overall COVID-19 Vaccine and the availability of other FDA-authorized or -approved COVID-19 vaccines (i.e., mRNA vaccines).    Have received dermal fillers  no FDA-authorized or -approved COVID-19 vaccines can be administered to people who have received injectable dermal fillers who have no contraindications for vaccination.  Infrequently, these people might experience temporary swelling at or near the site of filler injection (usually the face or lips) following administration of a dose of an mRNA COVID-19 vaccine. These people should be advised to contact their healthcare provider if swelling develops at or near the site of dermal filler following vaccination.     Have a history of Guillain-Barr Syndrome (GBS)  no People with a history of GBS can receive any FDA-authorized or -approved COVID-19 vaccine. However, given the possible association between the [employer] and an increased risk of GBS, a patient with a history of GBS and their clinical team should discuss the availability of mRNA vaccines to offer protection against COVID-19. The highest risk has been observed in men aged 69-64 years with symptoms of GBS beginning within 42 days after Alphonsa Overall COVID-19 vaccination.  People who had GBS after receiving Janssen vaccine should be made aware of the option to  receive an mRNA COVID-19 vaccine booster at least 2 months (8 weeks) after the Janssen dose. However, Alphonsa Overall vaccine may be used as a booster, particularly if GBS occurred more than 42 days  after vaccination or was related to a non-vaccine factor. Prior to booster vaccination, a conversation between the patient and their clinical team may assist with decisions about use of a COVID-19 booster dose, including the timing of administration     Postvaccination Observation Times for People without Contraindications to Covid 19 Vaccination.  30 minutes:  People with a history of: A contraindication to another type of COVID-19 vaccine product (i.e., mRNA or viral vector COVID-19 vaccines)   Immediate (within 4 hours of exposure) non-severe allergic reaction to a COVID-19 vaccine or injectable therapies   Anaphylaxis due to any cause   Immediate allergic reaction of any severity to a non-COVID-19 vaccine   15 minutes: All other people  This patient is a 87 y.o. female that meets the FDA criteria to receive homebound vaccination. Patient or parent/caregiver understands they have the option to accept or refuse homebound vaccination.  Patient passed the pre-screening checklist and would like to proceed with homebound vaccination.  Based on questionnaire above, I recommend the patient be observed for 15 minutes.  There are an estimated #0 other household members/caregivers who are also interested in receiving the vaccine.    The patient has been confirmed homebound and eligible for homebound vaccination with the considerations outlined above. I will send the patient's information to our scheduling team who will reach out to schedule the patient and potential caregiver/family members for homebound vaccination.    Dan Humphreys 10/25/2020 9:21 AM

## 2020-10-26 ENCOUNTER — Other Ambulatory Visit: Payer: Self-pay

## 2020-10-26 ENCOUNTER — Inpatient Hospital Stay (HOSPITAL_COMMUNITY): Payer: Medicare Other | Attending: Hematology

## 2020-10-26 ENCOUNTER — Ambulatory Visit (HOSPITAL_COMMUNITY): Payer: Medicare Other

## 2020-10-26 ENCOUNTER — Inpatient Hospital Stay (HOSPITAL_COMMUNITY): Payer: Medicare Other

## 2020-10-26 ENCOUNTER — Other Ambulatory Visit (HOSPITAL_COMMUNITY): Payer: Medicare Other

## 2020-10-26 VITALS — BP 145/75 | HR 60 | Temp 97.2°F | Resp 17

## 2020-10-26 DIAGNOSIS — E611 Iron deficiency: Secondary | ICD-10-CM

## 2020-10-26 DIAGNOSIS — N179 Acute kidney failure, unspecified: Secondary | ICD-10-CM | POA: Diagnosis not present

## 2020-10-26 DIAGNOSIS — N183 Chronic kidney disease, stage 3 unspecified: Secondary | ICD-10-CM | POA: Insufficient documentation

## 2020-10-26 DIAGNOSIS — I5043 Acute on chronic combined systolic (congestive) and diastolic (congestive) heart failure: Secondary | ICD-10-CM | POA: Diagnosis not present

## 2020-10-26 DIAGNOSIS — I130 Hypertensive heart and chronic kidney disease with heart failure and stage 1 through stage 4 chronic kidney disease, or unspecified chronic kidney disease: Secondary | ICD-10-CM | POA: Diagnosis not present

## 2020-10-26 DIAGNOSIS — N184 Chronic kidney disease, stage 4 (severe): Secondary | ICD-10-CM

## 2020-10-26 DIAGNOSIS — D631 Anemia in chronic kidney disease: Secondary | ICD-10-CM | POA: Insufficient documentation

## 2020-10-26 DIAGNOSIS — I4819 Other persistent atrial fibrillation: Secondary | ICD-10-CM | POA: Diagnosis not present

## 2020-10-26 DIAGNOSIS — D649 Anemia, unspecified: Secondary | ICD-10-CM

## 2020-10-26 DIAGNOSIS — E1122 Type 2 diabetes mellitus with diabetic chronic kidney disease: Secondary | ICD-10-CM | POA: Diagnosis not present

## 2020-10-26 LAB — CBC WITH DIFFERENTIAL/PLATELET
Abs Immature Granulocytes: 0.05 10*3/uL (ref 0.00–0.07)
Basophils Absolute: 0 10*3/uL (ref 0.0–0.1)
Basophils Relative: 0 %
Eosinophils Absolute: 0 10*3/uL (ref 0.0–0.5)
Eosinophils Relative: 0 %
HCT: 31.7 % — ABNORMAL LOW (ref 36.0–46.0)
Hemoglobin: 9.4 g/dL — ABNORMAL LOW (ref 12.0–15.0)
Immature Granulocytes: 1 %
Lymphocytes Relative: 12 %
Lymphs Abs: 0.7 10*3/uL (ref 0.7–4.0)
MCH: 27.9 pg (ref 26.0–34.0)
MCHC: 29.7 g/dL — ABNORMAL LOW (ref 30.0–36.0)
MCV: 94.1 fL (ref 80.0–100.0)
Monocytes Absolute: 0.5 10*3/uL (ref 0.1–1.0)
Monocytes Relative: 8 %
Neutro Abs: 4.7 10*3/uL (ref 1.7–7.7)
Neutrophils Relative %: 79 %
Platelets: 218 10*3/uL (ref 150–400)
RBC: 3.37 MIL/uL — ABNORMAL LOW (ref 3.87–5.11)
RDW: 16.9 % — ABNORMAL HIGH (ref 11.5–15.5)
WBC: 6 10*3/uL (ref 4.0–10.5)
nRBC: 0 % (ref 0.0–0.2)

## 2020-10-26 MED ORDER — EPOETIN ALFA-EPBX 40000 UNIT/ML IJ SOLN
INTRAMUSCULAR | Status: AC
  Filled 2020-10-26: qty 1

## 2020-10-26 MED ORDER — EPOETIN ALFA-EPBX 40000 UNIT/ML IJ SOLN
40000.0000 [IU] | Freq: Once | INTRAMUSCULAR | Status: AC
  Administered 2020-10-26: 40000 [IU] via SUBCUTANEOUS

## 2020-10-26 NOTE — Progress Notes (Signed)
[name] [name] presents today for Retacrit injection. Hemoglobin reviewed prior to administration. VSS. Injection tolerated without incident or complaint. See MAR for details. Patient discharged in satisfactory condition with follow up instructions.

## 2020-10-26 NOTE — Patient Instructions (Signed)
[facility]  Discharge Instructions:  Epoetin Alfa injection What is this medicine? EPOETIN ALFA (e POE e tin AL fa) helps your body make more red blood cells. This medicine is used to treat anemia caused by chronic kidney disease, cancer chemotherapy, or HIV-therapy. It may also be used before surgery if you have anemia. This medicine may be used for other purposes; ask your health care provider or pharmacist if you have questions. COMMON BRAND NAME(S): Epogen, Procrit, Retacrit What should I tell my health care provider before I take this medicine? They need to know if you have any of these conditions:  cancer  heart disease  high blood pressure  history of blood clots  history of stroke  low levels of folate, iron, or vitamin B12 in the blood  seizures  an unusual or allergic reaction to erythropoietin, albumin, benzyl alcohol, hamster proteins, other medicines, foods, dyes, or preservatives  pregnant or trying to get pregnant  breast-feeding How should I use this medicine? This medicine is for injection into a vein or under the skin. It is usually given by a health care professional in a hospital or clinic setting. If you get this medicine at home, you will be taught how to prepare and give this medicine. Use exactly as directed. Take your medicine at regular intervals. Do not take your medicine more often than directed. It is important that you put your used needles and syringes in a special sharps container. Do not put them in a trash can. If you do not have a sharps container, call your pharmacist or healthcare provider to get one. A special MedGuide will be given to you by the pharmacist with each prescription and refill. Be sure to read this information carefully each time. Talk to your pediatrician regarding the use of this medicine in children. While this drug may be prescribed for selected conditions, precautions do  apply. Overdosage: If you think you have taken too much of this medicine contact a poison control center or emergency room at once. NOTE: This medicine is only for you. Do not share this medicine with others. What if I miss a dose? If you miss a dose, take it as soon as you can. If it is almost time for your next dose, take only that dose. Do not take double or extra doses. What may interact with this medicine? Interactions have not been studied. This list may not describe all possible interactions. Give your health care provider a list of all the medicines, herbs, non-prescription drugs, or dietary supplements you use. Also tell them if you smoke, drink alcohol, or use illegal drugs. Some items may interact with your medicine. What should I watch for while using this medicine? Your condition will be monitored carefully while you are receiving this medicine. You may need blood work done while you are taking this medicine. This medicine may cause a decrease in vitamin B6. You should make sure that you get enough vitamin B6 while you are taking this medicine. Discuss the foods you eat and the vitamins you take with your health care professional. What side effects may I notice from receiving this medicine? Side effects that you should report to your doctor or health care professional as soon as possible:  allergic reactions like skin rash, itching or hives, swelling of the face, lips, or tongue  seizures  signs and symptoms of a blood clot such as breathing problems; changes in vision; chest pain; severe, sudden   headache; pain, swelling, warmth in the leg; trouble speaking; sudden numbness or weakness of the face, arm or leg  signs and symptoms of a stroke like changes in vision; confusion; trouble speaking or understanding; severe headaches; sudden numbness or weakness of the face, arm or leg; trouble walking; dizziness; loss of balance or coordination Side effects that usually do not require  medical attention (report to your doctor or health care professional if they continue or are bothersome):  chills  cough  dizziness  fever  headaches  joint pain  muscle cramps  muscle pain  nausea, vomiting  pain, redness, or irritation at site where injected This list may not describe all possible side effects. Call your doctor for medical advice about side effects. You may report side effects to FDA at 1-800-FDA-1088. Where should I keep my medicine? Keep out of the reach of children. Store in a refrigerator between 2 and 8 degrees C (36 and 46 degrees F). Do not freeze or shake. Throw away any unused portion if using a single-dose vial. Multi-dose vials can be kept in the refrigerator for up to 21 days after the initial dose. Throw away unused medicine. NOTE: This sheet is a summary. It may not cover all possible information. If you have questions about this medicine, talk to your doctor, pharmacist, or health care provider.  2021 Elsevier/Gold Standard (2017-04-20 08:35:19)  _______________________________________________________________  Thank you for choosing [facility] at [facility] to provide your oncology and hematology care.  To afford each patient quality time with our providers, please arrive at least 15 minutes before your scheduled appointment.  You need to re-schedule your appointment if you arrive 10 or more minutes late.  We strive to give you quality time with our providers, and arriving late affects you and other patients whose appointments are after yours.  Also, if you no show three or more times for appointments you may be dismissed from the clinic.  Again, thank you for choosing [facility] at [facility]. Our hope is that these requests will allow you access to exceptional care and in a timely manner. _______________________________________________________________  If you have questions after your visit, please  contact our office at [phone] between the hours of 8:30 a.m. and 5:00 p.m. Voicemails left after 4:30 p.m. will not be returned until the following business day. _______________________________________________________________  For prescription refill requests, have your pharmacy contact our office. _______________________________________________________________  Recommendations made by the consultant and any test results will be sent to your referring physician. _______________________________________________________________ 

## 2020-10-27 ENCOUNTER — Encounter: Payer: Self-pay | Admitting: Cardiovascular Disease

## 2020-10-27 ENCOUNTER — Ambulatory Visit (INDEPENDENT_AMBULATORY_CARE_PROVIDER_SITE_OTHER): Payer: Medicare Other

## 2020-10-27 ENCOUNTER — Ambulatory Visit (INDEPENDENT_AMBULATORY_CARE_PROVIDER_SITE_OTHER): Payer: Medicare Other | Admitting: Cardiovascular Disease

## 2020-10-27 VITALS — BP 153/77 | HR 62 | Ht 67.0 in | Wt 196.4 lb

## 2020-10-27 DIAGNOSIS — I4819 Other persistent atrial fibrillation: Secondary | ICD-10-CM | POA: Diagnosis not present

## 2020-10-27 DIAGNOSIS — Z952 Presence of prosthetic heart valve: Secondary | ICD-10-CM | POA: Diagnosis not present

## 2020-10-27 DIAGNOSIS — I5042 Chronic combined systolic (congestive) and diastolic (congestive) heart failure: Secondary | ICD-10-CM

## 2020-10-27 DIAGNOSIS — I442 Atrioventricular block, complete: Secondary | ICD-10-CM

## 2020-10-27 DIAGNOSIS — Z950 Presence of cardiac pacemaker: Secondary | ICD-10-CM | POA: Diagnosis not present

## 2020-10-27 DIAGNOSIS — Z7901 Long term (current) use of anticoagulants: Secondary | ICD-10-CM

## 2020-10-27 DIAGNOSIS — I10 Essential (primary) hypertension: Secondary | ICD-10-CM | POA: Diagnosis not present

## 2020-10-27 DIAGNOSIS — D594 Other nonautoimmune hemolytic anemias: Secondary | ICD-10-CM | POA: Diagnosis not present

## 2020-10-27 DIAGNOSIS — I484 Atypical atrial flutter: Secondary | ICD-10-CM

## 2020-10-27 DIAGNOSIS — Z5181 Encounter for therapeutic drug level monitoring: Secondary | ICD-10-CM

## 2020-10-27 DIAGNOSIS — J9611 Chronic respiratory failure with hypoxia: Secondary | ICD-10-CM | POA: Diagnosis not present

## 2020-10-27 DIAGNOSIS — E669 Obesity, unspecified: Secondary | ICD-10-CM

## 2020-10-27 LAB — POCT INR: INR: 2.4 (ref 2.0–3.0)

## 2020-10-27 NOTE — Progress Notes (Signed)
Cardiology Office Note    Date:  10/30/2020   ID:  Hodaya Curto, DOB [birth year], MRN [id number]  PCP:  Fayrene Helper, MD  Cardiologist:  Cristopher Peru, M.D. (EP/ICD); Sanda Klein, MD   Chief Complaint  Patient presents with  . Congestive Heart Failure       . Pacemaker Check  . Cardiac Valve Problem    History of Present Illness:  [name] [name] is a 87 y.o. female returning in follow-up for valvular heart disease, persistent atrial fibrillation, complete heart block and chronic combined systolic and diastolic heart failure.  She is doing reasonably well since her last hospitalization October 2021, when she had CHF exacerbation and new-onset seizures.  Discharge weight was around 91 kg.  She was seen in the emergency room on December 17 for "COPD exacerbation.  She had a productive cough and nasal congestion.  She did not require hospitalization.  She continues to use oxygen by nasal cannula at 2-3 L/minute.  Her nephrologist recently increased her furosemide to twice daily due to edema.  This has led to improvement.  She denies orthopnea or PND but usually sleeps in a recliner.  She is currently on furosemide 80 mg total daily and metolazone 2.5 mg daily.  Her creatinine has been stable at 1.7-1.8 (most recently 1.7 on 10/21/2020).  Weight is typically in the mid 190s range.  Interrogation of her pacemaker confirms that she recently had a trend for increasing fluid, but after the increase in furosemide last week she has returned to baseline.  Device function is otherwise normal.  She has a [employer] device with an estimated 4.2 years of remaining longevity.  She has 99.7% biventricular pacing.  She has very frequent PVCs.  The device has recorded for episodes of nonsustained VT, 6-17 beats with cycle length around 320 ms, but also a handful of episodes of "ventricular sensed rhythm" that also represent nonsustained VT at slower rates around cycle length of 470 ms,  lead parameters are stable.  She does have an abandoned lead in place and is unable to have MRI scans even though her current device is MRI conditional.  On 01/31/2020 she had an echocardiogram that showed reduced LVEF to 30-35% with normal function of the aortic and mitral prostheses.  Subsequent review of the echo suggests that her EF is more likely around 50%.  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  Most recently on 09/28/2020 hemoglobin is 8.6.  She is on chronic warfarin anticoagulation, followed in the [facility].  She is pacemaker dependent due to complete heart block following aortic and mitral valve replacement with mechanical prostheses. (AVR 2001, MVR 2004, both [employer] prostheses, surgeries performed in [place]). She had severe cardiomyopathy with an estimated left ventricular ejection fraction of 25%, but the most recent echo in March 18, 2019 showed improved LVEF to 55%.  She did not have any coronary artery disease by angiography before her first operation. In May 2016 her initial pacemaker lead was extracted and she received a CRT-D Medtronic Viva XT device by Dr. Cristopher Peru.  She has had problems with multifactorial anemia, including a component of prosthesis related hemolytic anemia, baseline hemoglobin around 10.   Past Medical History:  Diagnosis Date  . Acute on chronic diastolic CHF (congestive heart failure) ([place]) 05/19/2012  . Acute on chronic respiratory failure with hypoxia ([place]) 01/31/2020  . Adenomatous  polyp of colon 12/16/2002   Dr. [name]/[facility]  . Allergy   . Calculus of gallbladder with chronic cholecystitis without obstruction 02/09/2009   Qualifier: Diagnosis of  By: [name], [name]    . Cataract   . CHB (complete heart block) ([place]) 10/07/2014      . CHF (congestive heart failure) ([place])    a.  EF previously reduced to  25-30% b. EF improved to 60-65% by echo in 10/2017.   Marland Kitchen Chronic gout due to renal impairment    Per [facility] Records [facility]   . Chronic kidney disease, stage I 2006  . Cognitive communication deficit    Per [facility]   . Constipation       . Convulsion ([place])    Per [facility] Records [facility]   . COPD (chronic obstructive pulmonary disease) ([place])       . DJD (degenerative joint disease) of lumbar spine   . DM (diabetes mellitus) ([place]) 2006  . Dysphagia, unspecified    Per [facility] Records [facility]   . Esophageal reflux   . Glaucoma   . Gout       . H/O adenomatous polyp of colon 05/24/2012   2004 in [place] colonoscopy 2008 normal    . H/O heart valve replacement with mechanical valve    a. s/p mechanical AVR in 2001 and mechanical MVR in 2004.  Marland Kitchen H/O wisdom tooth extraction   . HIP PAIN, RIGHT 04/23/2009   Qualifier: Diagnosis of  By: [name], [name]    . Hyperkalemia    Per [facility] Records [facility]   . Hyperlipemia   . Hypertensive heart and kidney disease with HF and with CKD stage I-IV ([place])    Per [facility]   . IDA (iron deficiency anemia)    Parenteral iron/[name]  . Insomnia       . Localized edema    Left arm, Per [facility] Records [facility]   . Long term (current) use of anticoagulants    Per [facility] Records [facility]   . Nausea without vomiting 06/07/2010   Qualifier: Diagnosis of  By: [name], [name]    . Non-ischemic cardiomyopathy ([place])    a. s/p MDT CRTD  . Obesity, unspecified   . Oxygen deficiency 2014   nocturnal;  . Papilloma of breast 03/06/2012   This was diagnosed as a left breast papilloma by  ultrasound characteristics and symptoms in 2010. Because of possible medical risk factors no surgical intervention has been done and it did doing annual followups. The area has been stable by physical examination and mammograms and ultrasound since 2010.   . Presence of cardiac pacemaker    Per [facility] Records [facility]   . Spinal stenosis of lumbar region 02/19/2013  . Spondylolisthesis   . Spondylosis   . Thyroid cancer ([place])    remote thyroidectomy, no recurrence, pt denies in 2016 thyroid cancer  . Type 2 diabetes mellitus with hyperglycemia ([place]) 07/15/2010  . Type 2 diabetes mellitus with hypoglycemia without coma ([place])    Per [facility] Records [facility]   . Unspecified chronic bronchitis ([place])    Per [facility] Records  System   . Unspecified hypothyroidism       . Urinary tract infection, site not specified    Per [facility] Records [facility]   . 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 Vertigo         Past Surgical History:  Procedure Laterality Date  . AORTIC AND MITRAL VALVE REPLACEMENT  2001  . BI-VENTRICULAR IMPLANTABLE CARDIOVERTER DEFIBRILLATOR UPGRADE N/A 01/18/2015   Procedure: BI-VENTRICULAR IMPLANTABLE CARDIOVERTER DEFIBRILLATOR UPGRADE;  Surgeon: Evans Lance, MD;  Location: [facility] CATH LAB;  Service: Cardiovascular;  Laterality: N/A;  . BIV ICD GENERATOR CHANGEOUT N/A 01/30/2019   Procedure: BIV ICD GENERATOR CHANGEOUT;  Surgeon: Evans Lance, MD;  Location: [place] CV LAB;  Service: Cardiovascular;  Laterality: N/A;  . CARDIAC CATHETERIZATION    . CARDIAC VALVE REPLACEMENT    . CARDIOVERSION N/A 11/13/2016   Procedure: CARDIOVERSION;  Surgeon: Sanda Klein, MD;  Location: [facility] ENDOSCOPY;  Service: Cardiovascular;  Laterality: N/A;  . COLONOSCOPY  06/12/2007   [place]k- Normal rectum/Normal colon   . COLONOSCOPY  12/16/02   small hemorrhoids  . COLONOSCOPY  06/20/2012   Procedure: COLONOSCOPY;  Surgeon: Daneil Dolin, MD;  Location: [facility] ENDO SU[facility];  Service: Endoscopy;  Laterality: N/A;  10:45  . defibrillator implanted 2006    . DOPPLER ECHOCARDIOGRAPHY  2012  . DOPPLER ECHOCARDIOGRAPHY  05,06,07,08,09,2011  . ESOPHAGOGASTRODUODENOSCOPY  06/12/2007   Rourk- normal esophagus, small hiatal hernia, otherwise normal stomach, D1, D2  . EYE SURGERY Right 2005  . EYE SURGERY Left 2006  . ICD LEAD REMOVAL Left 02/08/2015   Procedure: ICD LEAD REMOVAL;  Surgeon: Evans Lance, MD;  Location[facility] OR;  Service: Cardiovascular;  Laterality: Left;  "Will plan extraction and insertion of a BiV PM"  **Dr. Roxan Hockey backing up case**  . IMPLANTABLE CARDIOVERTER DEFIBRILLATOR (ICD) GENERATOR CHANGE Left 02/08/2015   Procedure: ICD GENERATOR CHANGE;  Surgeon: Evans Lance, MD;  Location: [place];  Service: Cardiovascular;  Laterality: Left;  . INSERT / REPLACE / REMOVE PACEMAKER    . PACEMAKER INSERTION  May 2016  . pacemaker placed  2004  . right breast cyst removed     benign   . right cataract removed     2005  . TEE WITHOUT CARDIOVERSION N/A 11/13/2016   Procedure: TRANSESOPHAGEAL ECHOCARDIOGRAM (TEE);  Surgeon: Sanda Klein, MD;  Location: [facility] ENDOSCOPY;  Service: Cardiovascular;  Laterality: N/A;  . THYROIDECTOMY      Current Medications: Outpatient Medications Prior to Visit  Medication Sig Dispense Refill  . acetaminophen (TYLENOL) 500 MG tablet Take 1 tablet (500 mg total) by mouth every 6 (six) hours as needed (pain). 30 tablet 0  . albuterol (PROVENTIL) (2.5 MG/3ML) 0.083% nebulizer solution Take 3 mLs (2.5 mg total) by nebulization in the morning and at bedtime. *May use additional one time if needed for shortness of breath 175 mL 0  . albuterol (VENTOLIN HFA) 108 (90 Base) MCG/ACT inhaler Inhale 1 puff into the lungs every 6 (six) hours as needed for wheezing or shortness of  breath. 6.7 g 0  . allopurinol (ZYLOPRIM) 100 MG tablet TAKE ONE TABLET BY MOUTH ONCE DAILY. 30 tablet 0  . benzonatate (TESSALON) 100 MG capsule Take 1 capsule (100 mg total) by mouth 2 (two) times daily as needed for cough. (Patient not taking: Reported on 10/29/2020) 20 capsule 0  . BREO ELLIPTA 100-25 MCG/INH AEPB INHALE (1) PUFF BY MOUTH INTO THE LUNGS  DAILY 60 each 0  . cholecalciferol (VITAMIN D3) 25 MCG (1000 UNIT) tablet Take 1 tablet (1,000 Units total) by mouth in the morning. (Patient not taking: Reported on 10/29/2020)    . fluticasone (FLONASE) 50 MCG/ACT nasal spray Place 2 sprays into both nostrils daily. 48 g 1  . folic acid (FOLVITE) 1 MG tablet TAKE ONE TABLET BY MOUTH ONCE DAILY. 30 tablet 0  . furosemide (LASIX) 40 MG tablet TAKE ONE TABLET BY MOUTH ONCE DAILY. (Patient taking differently: Take 40 mg by mouth 2 (two) times daily.) 30 tablet 0  . gabapentin (NEURONTIN) 300 MG capsule TAKE (1) CAPSULE BY MOUTH TWICE DAILY 60 capsule 0  . hydrALAZINE (APRESOLINE) 25 MG tablet Take 25 mg by mouth 3 (three) times daily.    Marland Kitchen HYDROcodone-homatropine (HYCODAN) 5-1.5 MG/5ML syrup Take 5 mLs by mouth every 8 (eight) hours as needed for cough. 120 mL 0  . levETIRAcetam (KEPPRA) 500 MG tablet TAKE (1) TABLET BY MOUTH TWICE DAILY AT 9AM AND 9PM 60 tablet 0  . levofloxacin (LEVAQUIN) 500 MG tablet Take 1 tablet (500 mg total) by mouth daily. (Patient not taking: Reported on 10/29/2020) 5 tablet 0  . Liniments (SALONPAS PAIN RELIEF PATCH EX) Apply 1 patch topically daily as needed (pain).     . metoprolol tartrate (LOPRESSOR) 50 MG tablet TAKE (1) TABLET BY MOUTH TWICE DAILY AT 9AM AND 9PM. 60 tablet 0  . nitroGLYCERIN (NITROSTAT) 0.4 MG SL tablet DISSOLVE 1 TABLET SUBLINGUALLY AS NEEDED FOR CHEST PAIN, MAY REPEAT EVERY 5 MINUTES. AFTER 3 CALL 911. 30 tablet 0  . NON FORMULARY Diet Change: Soft diet with dys 2 (ground) meats, thin liquids (NAS, cons CHO, heart healthy) (Patient not taking: Reported  on 10/29/2020)    . ondansetron (ZOFRAN) 4 MG tablet Take by mouth at bedtime. (Patient not taking: Reported on 10/29/2020)    . ondansetron (ZOFRAN-ODT) 4 MG disintegrating tablet TAKE (1) TABLET BY MOUTH EVERY EIGHT HOURS AS NEEDED FOR NAUSEA OR VOMITING 10 tablet 0  . OXYGEN Inhale 3 L into the lungs continuous.    [name] [name] (SYSTANE) 0.4-0.3 % SOLN Apply 1 drop to eye at bedtime. Left eye (Patient not taking: Reported on 10/29/2020)    . polyethylene glycol (MIRALAX / GLYCOLAX) 17 g packet Take 17 g by mouth daily as needed. 14 each 0  . potassium chloride SA (KLOR-CON) 20 MEQ tablet TAKE ONE TABLET BY MOUTH ONCE DAILY. 30 tablet 0  . pravastatin (PRAVACHOL) 40 MG tablet TAKE ONE TABLET BY MOUTH ONCE DAILY. 30 tablet 0  . predniSONE (DELTASONE) 20 MG tablet Take 1 tablet (20 mg total) by mouth 2 (two) times daily. (Patient not taking: Reported on 10/29/2020) 10 tablet 0  . senna (SENOKOT) 8.6 MG TABS tablet Take 2 tablets (17.2 mg total) by mouth daily as needed for mild constipation. 120 tablet 0  . SYNTHROID 88 MCG tablet TAKE 1 TABLET BEFORE BREAKFAST. 30 tablet 0  . UNABLE TO FIND Compression Stockings R-  Ankle  26.5cm   Calf 38cm  Leg  44cm L-  Ankle  27.5 cm   Calf 39cm  Leg  44cm (Patient not taking: Reported on 10/29/2020) 1 each 0  . warfarin (COUMADIN) 10 MG tablet TAKE (1) TABLET BY MOUTH DAILY AT 4PM 30 tablet 0   Facility-Administered Medications Prior to Visit  Medication Dose Route Frequency Provider Last Rate Last Admin  . epoetin alfa (EPOGEN,PROCRIT) injection 40,000 Units  40,000 Units Subcutaneous Once [place],  Belenda Cruise, MD         Allergies:   Penicillins, Aspirin, Fentanyl, and Niacin   Social History   Socioeconomic History  . Marital status: Widowed    Spouse name: Not on file  . Number of children: 0  . Years of education: Not on file  . Highest education level: Not on file  Occupational History  . Occupation: retired    Fish farm manager: RETIRED   Tobacco Use  . Smoking status: Former Smoker    Packs/day: 0.75    Years: 40.00    Pack years: 30.00    Types: Cigarettes    Quit date: 03/08/1987    Years since quitting: 33.6  . Smokeless tobacco: Never Used  Vaping Use  . Vaping Use: Never used  Substance and Sexual Activity  . Alcohol use: No    Alcohol/week: 0.0 standard drinks  . Drug use: No  . Sexual activity: Not Currently  Other Topics Concern  . Not on file  Social History Narrative   From [place] (2004)   Social Determinants of Health   Financial Resource Strain: Low Risk   . Difficulty of Paying Living Expenses: Not hard at all  Food Insecurity: No Food Insecurity  . Worried About Charity fundraiser in the Last Year: Never true  . Ran Out of Food in the Last Year: Never true  Transportation Needs: No Transportation Needs  . Lack of Transportation (Medical): No  . Lack of Transportation (Non-Medical): No  Physical Activity: Inactive  . Days of Exercise per Week: 0 days  . Minutes of Exercise per Session: 0 min  Stress: No Stress Concern Present  . Feeling of Stress : Only a little  Social Connections: Moderately Isolated  . Frequency of Communication with Friends and Family: More than three times a week  . Frequency of Social Gatherings with Friends and Family: Twice a week  . Attends Religious Services: More than 4 times per year  . Active Member of Clubs or Organizations: No  . Attends Archivist Meetings: Never  . Marital Status: Widowed     Family History:  The patient's family history includes Diabetes in her brother and brother; Heart attack in her sister; Heart disease in her brother, brother, father, and sister; Hypertension in her brother; Lung cancer in her brother; Pancreatic cancer in her mother.   ROS:   Please see the history of present illness.    ROS All other systems are reviewed and are negative.   PHYSICAL EXAM:   VS:  BP (!) 153/77 (BP Location: Right Arm, Patient  Position: Sitting)   Pulse 62   Ht 5\' 7"  (1.702 m)   Wt 196 lb 6.4 oz (89.1 kg)   SpO2 95%   BMI 30.76 kg/m     General: Alert, oriented x3, no distress, ovese Head: no evidence of trauma, PERRL, EOMI, no exophtalmos or lid lag, no myxedema, no xanthelasma; normal ears, nose and oropharynx Neck: JVP 3-4 cm; brisk carotid pulses without delay and no carotid bruits Chest: clear to auscultation, no signs of consolidation by percussion or palpation, normal fremitus, symmetrical and full respiratory excursions Cardiovascular: normal position and quality of the apical impulse, regular rhythm, crisp prosthetic valve clicks, no diastolic murmurs, rubs or gallops Abdomen: no tenderness or distention, no masses by palpation, no abnormal pulsatility or arterial bruits, normal bowel sounds, no hepatosplenomegaly Extremities: no clubbing, cyanosis; trivial symmetrical pedal edema; 2+ radial, ulnar and brachial pulses bilaterally; 2+ right femoral, posterior  tibial and dorsalis pedis pulses; 2+ left femoral, posterior tibial and dorsalis pedis pulses; no subclavian or femoral bruits Neurological: grossly nonfocal Psych: Normal mood and affect   Wt Readings from Last 3 Encounters:  10/27/20 196 lb 6.4 oz (89.1 kg)  09/28/20 190 lb (86.2 kg)  09/10/20 193 lb (87.5 kg)    Studies/Labs Reviewed:   ECHO 01/31/2020  1. Left ventricular ejection fraction, by estimation, is 30 to 35%. The  left ventricle has moderately decreased function. The left ventricle  demonstrates global hypokinesis. There is mild concentric left ventricular  hypertrophy. Left ventricular  diastolic function could not be evaluated.  2. Right ventricular systolic function is normal. The right ventricular  size is normal. There is severely elevated pulmonary artery systolic  pressure. The estimated right ventricular systolic pressure is 32.6 mmHg.  3. Left atrial size was severely dilated.  4. Right atrial size was moderately  dilated.  5. The mitral valve has been repaired/replaced. No evidence of mitral  valve regurgitation.  6. The aortic valve has been repaired/replaced. Aortic valve  regurgitation is not visualized.   EKG:  EKG is not ordered today.  ECG from 07/09/2020 shows BiV pacing w +ve R V1, background atrial flutter. Same rhythm on today intracardiac EGM.  Recent Labs: 02/16/2020: TSH 2.32 07/07/2020: Magnesium 2.1 07/08/2020: B Natriuretic Peptide 913.0 09/28/2020: ALT 19 10/21/2020: BUN 56; Creatinine, Ser 1.70; Potassium 4.1; Sodium 136 10/26/2020: Hemoglobin 9.4; Platelets 218   Lipid Panel    Component Value Date/Time   CHOL 124 01/05/2020 1253   CHOL 165 10/16/2016 1213   TRIG 73 01/05/2020 1253   HDL 44 (L) 01/05/2020 1253   HDL 31 (L) 10/16/2016 1213   CHOLHDL 2.8 01/05/2020 1253   VLDL 12 07/07/2019 1354   LDLCALC 65 01/05/2020 1253     ASSESSMENT:    1. Chronic combined systolic (congestive) and diastolic (congestive) heart failure ([place])   2. Atypical atrial flutter (HCC)   3. CHB (complete heart block) (HCC)   4. Biventricular cardiac pacemaker in situ   5. Long term (current) use of anticoagulants   6. H/O mitral valve replacement with mechanical valve   7. Hx of aortic valve replacement, mechanical   8. Other non-autoimmune hemolytic anemias ([place])   9. Essential hypertension   10. Mild obesity   11. Chronic respiratory failure with hypoxia (HCC)      PLAN:  In order of problems listed above:  1. CHF:  Volume status returning to baseline after her Nephrologist increased the loop diuretic. Dry weight seems to be mid 190s. Per the record, she has moderately depressed left ventricular systolic function with LVEF 30-35% with global hypokinesis.  I reviewed this last echo from May 2021 myself and disagree with the interpretation.  I think the ejection fraction is around 50% and there actually appears to be fairly good resynchronization. On hydralazine/nitrates and  metoprolol.  Avoiding RAAS inhibitors due to renal dysfunction. 2. AFib/A flutter: permanent.  Rate control is not an issue since she has complete heart block and she is on full anticoagulation for her prosthetic valves. 3. CHB: Pacemaker dependent. 4. CRT-P: Normal device function. 99.7% BiV paced, 91% efficient, lots of PVCs. 5. Warfarin anticoagulation: INR 2.4 today. 6. Mechanical mitral valve prosthesis: Gradients from May 2021 similar to Echo June 2020 (mean gradient of 5 mmHg, improved pressure half-time 53 ms 7. Mechanical aortic valve prosthesis: Stable mean aortic valve gradient 9 mmHg, dimensionless index 0.7. 8. Prosthetic valve associated hemolysis: Hemoglobin  9.4 on 10/26/2020 range 8.6-9.4 last few months). 9. HTN: was 133/72 at recent office visit. Usually SBP in the 120s. 10. CKD 3b: Stable. Creatinine baseline appears to be 1.6, corresponding to a GFR approximately 30.  Most recent creatinine was 1.7 on 10/21/2020 11. Obesity:  Very sedentary 12. Chronic home O2    Medication Adjustments/Labs and Tests Ordered: Current medicines are reviewed at length with the patient today.  Concerns regarding medicines are outlined above.  Medication changes, Labs and Tests ordered today are listed in the Patient Instructions below. Patient Instructions  Medication Instructions:  No changes *If you need a refill on your cardiac medications before your next appointment, please call your pharmacy*   Lab Work: None ordered If you have labs (blood work) drawn today and your tests are completely normal, you will receive your results only by: Marland Kitchen MyChart Message (if you have MyChart) OR . A paper copy in the mail If you have any lab test that is abnormal or we need to change your treatment, we will call you to review the results.   Testing/Procedures: None ordered   Follow-Up: At [facility], you and your health needs are our priority.  As part of our continuing mission to provide you  with exceptional heart care, we have created designated Provider Care Teams.  These Care Teams include your primary Cardiologist (physician) and Advanced Practice Providers (APPs -  Physician Assistants and Nurse Practitioners) who all work together to provide you with the care you need, when you need it.  We recommend signing up for the patient portal called "MyChart".  Sign up information is provided on this After Visit Summary.  MyChart is used to connect with patients for Virtual Visits (Telemedicine).  Patients are able to view lab/test results, encounter notes, upcoming appointments, etc.  Non-urgent messages can be sent to your provider as well.   To learn more about what you can do with MyChart, go to [email or web address].    Your next appointment:   3 month(s)on a device day  The format for your next appointment:   In Person  Provider:   Sanda Klein, MD       Signed, Sanda Klein, MD  10/30/2020 12:16 PM    [address], [place], [place]  [address] Phone: [phone]; Fax: [phone]

## 2020-10-27 NOTE — Patient Instructions (Signed)
Medication Instructions:  No changes *If you need a refill on your cardiac medications before your next appointment, please call your pharmacy*   Lab Work: None ordered If you have labs (blood work) drawn today and your tests are completely normal, you will receive your results only by: MyChart Message (if you have MyChart) OR A paper copy in the mail If you have any lab test that is abnormal or we need to change your treatment, we will call you to review the results.   Testing/Procedures: None ordered   Follow-Up: At [facility], you and your health needs are our priority.  As part of our continuing mission to provide you with exceptional heart care, we have created designated Provider Care Teams.  These Care Teams include your primary Cardiologist (physician) and Advanced Practice Providers (APPs -  Physician Assistants and Nurse Practitioners) who all work together to provide you with the care you need, when you need it.  We recommend signing up for the patient portal called "MyChart".  Sign up information is provided on this After Visit Summary.  MyChart is used to connect with patients for Virtual Visits (Telemedicine).  Patients are able to view lab/test results, encounter notes, upcoming appointments, etc.  Non-urgent messages can be sent to your provider as well.   To learn more about what you can do with MyChart, go to [email or web address].    Your next appointment:   3 month(s) on a device day  The format for your next appointment:   In Person  Provider:   Mihai Croitoru, MD   

## 2020-10-27 NOTE — Patient Instructions (Signed)
Spoke with [facility]. Continue warfarin 1 tablet  (10mg ) daily except for 1/2 a tablet (5mg )  on Monday, Wednesday and Friday. [place] will recheck INR on 11/03/20

## 2020-10-29 ENCOUNTER — Encounter: Payer: Self-pay | Admitting: *Deleted

## 2020-10-29 ENCOUNTER — Ambulatory Visit (INDEPENDENT_AMBULATORY_CARE_PROVIDER_SITE_OTHER): Payer: Medicare Other | Admitting: *Deleted

## 2020-10-29 DIAGNOSIS — E11649 Type 2 diabetes mellitus with hypoglycemia without coma: Secondary | ICD-10-CM | POA: Diagnosis not present

## 2020-10-29 DIAGNOSIS — I10 Essential (primary) hypertension: Secondary | ICD-10-CM

## 2020-10-29 DIAGNOSIS — I5032 Chronic diastolic (congestive) heart failure: Secondary | ICD-10-CM

## 2020-10-29 DIAGNOSIS — E1122 Type 2 diabetes mellitus with diabetic chronic kidney disease: Secondary | ICD-10-CM | POA: Diagnosis not present

## 2020-10-29 DIAGNOSIS — I950 Idiopathic hypotension: Secondary | ICD-10-CM | POA: Diagnosis not present

## 2020-10-29 DIAGNOSIS — I5043 Acute on chronic combined systolic (congestive) and diastolic (congestive) heart failure: Secondary | ICD-10-CM | POA: Diagnosis not present

## 2020-10-29 DIAGNOSIS — N184 Chronic kidney disease, stage 4 (severe): Secondary | ICD-10-CM | POA: Diagnosis not present

## 2020-10-29 DIAGNOSIS — I4819 Other persistent atrial fibrillation: Secondary | ICD-10-CM | POA: Diagnosis not present

## 2020-10-29 DIAGNOSIS — N179 Acute kidney failure, unspecified: Secondary | ICD-10-CM | POA: Diagnosis not present

## 2020-10-29 DIAGNOSIS — J449 Chronic obstructive pulmonary disease, unspecified: Secondary | ICD-10-CM | POA: Diagnosis not present

## 2020-10-29 DIAGNOSIS — J9621 Acute and chronic respiratory failure with hypoxia: Secondary | ICD-10-CM | POA: Diagnosis not present

## 2020-10-29 DIAGNOSIS — I130 Hypertensive heart and chronic kidney disease with heart failure and stage 1 through stage 4 chronic kidney disease, or unspecified chronic kidney disease: Secondary | ICD-10-CM | POA: Diagnosis not present

## 2020-10-29 NOTE — Patient Instructions (Addendum)
Visit Information     Please contact Jacqlyn Larsen RN for any questions at [phone]  PATIENT GOALS:  Goals Addressed            This Visit's Progress   . Track and Manage My Blood Pressure-Hypertension       Timeframe:  Long-Range Goal Priority:  Medium Start Date:      10/29/2020                       Expected End Date:  04/28/2021                     Follow Up Date 11/26/2020   . check blood pressure 3 times per week with the assistance of [facility] home aide . write blood pressure results in a log or diary  . follow a low salt diet- avoid fast food, salty chips, fries, etc.   Why is this important?    You won't feel high blood pressure, but it can still hurt your blood vessels.   High blood pressure can cause heart or kidney problems. It can also cause a stroke.   Making lifestyle changes like losing a little weight or eating less salt will help.   Checking your blood pressure at home and at different times of the day can help to control blood pressure.   If the doctor prescribes medicine remember to take it the way the doctor ordered.   Call the office if you cannot afford the medicine or if there are questions about it.        . Track and Manage Symptoms-Heart Failure       Timeframe:  Long-Range Goal Priority:  High Start Date:       10/29/2020                      Expected End Date:   04/28/2021                    Follow Up Date 11/26/2020   . follow heart failure action plan if symptoms flare up .  weigh daily and record. . know when to call the doctor- call for weight gain of 3 pounds in one day or 5 pounds in one week. . Call [place] for any questions at [phone] . Wear support hose when you get them from pharmacy . Take medications as prescribed   Why is this important?    You will be able to handle your symptoms better if you keep track of them.   Making some simple changes to your lifestyle will help.   Eating healthy is one thing you can do to take  good care of yourself.           Consent to CCM Services: [name] [name] was given information about Chronic Care Management services today including:  1. CCM service includes personalized support from designated clinical staff supervised by her physician, including individualized plan of care and coordination with other care providers 2. 24/7 contact phone numbers for assistance for urgent and routine care needs. 3. Service will only be billed when office clinical staff spend 20 minutes or more in a month to coordinate care. 4. Only one practitioner may furnish and bill the service in a calendar month. 5. The patient may stop CCM services at any time (effective at the end of the month) by phone call to the office staff. 6. The patient will  be responsible for cost sharing (co-pay) of up to 20% of the service fee (after annual deductible is met).  Patient agreed to services and verbal consent obtained.   The patient verbalized understanding of instructions, educational materials, and care plan provided today and agreed to receive a mailed copy of patient instructions, educational materials, and care plan.   Telephone follow up appointment with care management team member scheduled for: 11/26/2020  Jacqlyn Larsen [facility], BSN RN Care Manager Coordinator [facility] [phone]    CLINICAL CARE PLAN: Patient Care Plan: Heart Failure (Adult)    Problem Identified: Symptom Exacerbation (Heart Failure)     Long-Range Goal: Symptom Exacerbation Prevented or Minimized   Start Date: 10/29/2020  Expected End Date: 04/28/2021  This Visit's Progress: On track  Priority: High  Note:   Current Barriers:  Marland Kitchen Knowledge deficits related to basic heart failure pathophysiology and self care management as evidenced by patient weighing every other day instead of daily Nurse Case Manager Clinical Goal(s):   Over the next 30 days, patient will weigh self daily and record  Over the next 60 days,  patient will verbalize understanding of Heart Failure Action Plan and when to call doctor Interventions:  . Collaboration with Fayrene Helper, MD regarding development and update of comprehensive plan of care as evidenced by provider attestation and co-signature . Inter-disciplinary care team collaboration (see longitudinal plan of care) . Basic overview and discussion of pathophysiology of Heart Failure . Provided verbal education on low sodium diet . Reviewed Heart Failure Action Plan in depth and provided written copy . Assessed for scales in home . Discussed importance of daily weight and recording . Reviewed role of diuretics in prevention of fluid overload . Contacted primary care provider about compression hose and if prescription sent to pharmacy . Reviewed medications with patient and home care aide and encouraged compliance Patient Goals/Self-Care Activities . Over the next 30 days, patient will:  . follow heart failure action plan if symptoms flare up . weigh daily and record. . know when to call the doctor- call for weight gain of 3 pounds in one day or 5 pounds in one week. . Call [place] for any questions at [phone] . Wear support hose when you get them from pharmacy . Take medications as prescribed Follow Up Plan: Telephone follow up appointment with care management team member scheduled for: 11/26/2020     Patient Care Plan: Hypertension (Adult)    Problem Identified: Hypertension (Hypertension)   Priority: Medium    Long-Range Goal: Hypertension Monitored   Start Date: 10/29/2020  Expected End Date: 04/28/2021  This Visit's Progress: On track  Priority: Medium  Note:   Objective:  . Last practice recorded BP readings:  BP Readings from Last 3 Encounters:  10/27/20 (!) 153/77  10/26/20 (!) 145/75  09/28/20 133/72   . Most recent eGFR/CrCl: No results found for: EGFR  No components found for: CRCL Current Barriers:  Marland Kitchen Knowledge Deficits related  to basic understanding of hypertension pathophysiology and self care management as evidenced by continued reports of elevated blood pressures . Unable to perform ADLs independently- has [facility] home care aide that assists with checking blood pressure several times weekly Case Manager Clinical Goal(s):  Marland Kitchen Over the next 60 days, patient will demonstrate improved adherence to prescribed treatment plan for hypertension as evidenced by taking all medications as prescribed, monitoring and recording blood pressure as directed, adhering to low sodium/DASH diet Interventions:  . Collaboration with [name],  Norwood Levo, MD regarding development and update of comprehensive plan of care as evidenced by provider attestation and co-signature . Inter-disciplinary care team collaboration (see longitudinal plan of care) . Reviewed medications with patient and discussed importance of compliance . Advised patient, providing education and rationale, to monitor blood pressure daily and record, calling PCP for findings outside established parameters.  . Reviewed low salt diet and importance of adherence Patient Goals/Self-Care Activities . Over the next 60 days, patient will:-   . check blood pressure 3 times per week with the assistance of [facility] home aide . write blood pressure results in a log or diary  . follow a low salt diet- avoid fast food, salty chips, fries, etc.  Follow Up Plan: Telephone follow up appointment with care management team member scheduled for: 11/26/2020      Managing Your Hypertension Hypertension, also called high blood pressure, is when the force of the blood pressing against the walls of the arteries is too strong. Arteries are blood vessels that carry blood from your heart throughout your body. Hypertension forces the heart to work harder to pump blood and may cause the arteries to become narrow or stiff. Understanding blood pressure readings Your personal target blood pressure may vary  depending on your medical conditions, your age, and other factors. A blood pressure reading includes a higher number over a lower number. Ideally, your blood pressure should be below 120/80. You should know that:  The first, or top, number is called the systolic pressure. It is a measure of the pressure in your arteries as your heart beats.  The second, or bottom number, is called the diastolic pressure. It is a measure of the pressure in your arteries as the heart relaxes. Blood pressure is classified into four stages. Based on your blood pressure reading, your health care provider may use the following stages to determine what type of treatment you need, if any. Systolic pressure and diastolic pressure are measured in a unit called mmHg. Normal  Systolic pressure: below 093.  Diastolic pressure: below 80. Elevated  Systolic pressure: 818-299.  Diastolic pressure: below 80. Hypertension stage 1  Systolic pressure: 371-696.  Diastolic pressure: 78-93. Hypertension stage 2  Systolic pressure: 810 or above.  Diastolic pressure: 90 or above. How can this condition affect me? Managing your hypertension is an important responsibility. Over time, hypertension can damage the arteries and decrease blood flow to important parts of the body, including the brain, heart, and kidneys. Having untreated or uncontrolled hypertension can lead to:  A heart attack.  A stroke.  A weakened blood vessel (aneurysm).  Heart failure.  Kidney damage.  Eye damage.  Metabolic syndrome.  Memory and concentration problems.  Vascular dementia. What actions can I take to manage this condition? Hypertension can be managed by making lifestyle changes and possibly by taking medicines. Your health care provider will help you make a plan to bring your blood pressure within a normal range. Nutrition  Eat a diet that is high in fiber and potassium, and low in salt (sodium), added sugar, and fat. An  example eating plan is called the Dietary Approaches to Stop Hypertension (DASH) diet. To eat this way: ? Eat plenty of fresh fruits and vegetables. Try to fill one-half of your plate at each meal with fruits and vegetables. ? Eat whole grains, such as whole-wheat pasta, brown rice, or whole-grain bread. Fill about one-fourth of your plate with whole grains. ? Eat low-fat dairy products. ? Avoid fatty cuts  of meat, processed or cured meats, and poultry with skin. Fill about one-fourth of your plate with lean proteins such as fish, chicken without skin, beans, eggs, and tofu. ? Avoid pre-made and processed foods. These tend to be higher in sodium, added sugar, and fat.  Reduce your daily sodium intake. Most people with hypertension should eat less than 1,500 mg of sodium a day.   Lifestyle  Work with your health care provider to maintain a healthy body weight or to lose weight. Ask what an ideal weight is for you.  Get at least 30 minutes of exercise that causes your heart to beat faster (aerobic exercise) most days of the week. Activities may include walking, swimming, or biking.  Include exercise to strengthen your muscles (resistance exercise), such as weight lifting, as part of your weekly exercise routine. Try to do these types of exercises for 30 minutes at least 3 days a week.  Do not use any products that contain nicotine or tobacco, such as cigarettes, e-cigarettes, and chewing tobacco. If you need help quitting, ask your health care provider.  Control any long-term (chronic) conditions you have, such as high cholesterol or diabetes.  Identify your sources of stress and find ways to manage stress. This may include meditation, deep breathing, or making time for fun activities.   Alcohol use  Do not drink alcohol if: ? Your health care provider tells you not to drink. ? You are pregnant, may be pregnant, or are planning to become pregnant.  If you drink alcohol: ? Limit how much you  use to:  0-1 drink a day for women.  0-2 drinks a day for men. ? Be aware of how much alcohol is in your drink. In the [place] one drink equals one 12 oz bottle of beer (355 mL), one 5 oz glass of wine (148 mL), or one 1 oz glass of hard liquor (44 mL). Medicines Your health care provider may prescribe medicine if lifestyle changes are not enough to get your blood pressure under control and if:  Your systolic blood pressure is 130 or higher.  Your diastolic blood pressure is 80 or higher. Take medicines only as told by your health care provider. Follow the directions carefully. Blood pressure medicines must be taken as told by your health care provider. The medicine does not work as well when you skip doses. Skipping doses also puts you at risk for problems. Monitoring Before you monitor your blood pressure:  Do not smoke, drink caffeinated beverages, or exercise within 30 minutes before taking a measurement.  Use the bathroom and empty your bladder (urinate).  Sit quietly for at least 5 minutes before taking measurements. Monitor your blood pressure at home as told by your health care provider. To do this:  Sit with your back straight and supported.  Place your feet flat on the floor. Do not cross your legs.  Support your arm on a flat surface, such as a table. Make sure your upper arm is at heart level.  Each time you measure, take two or three readings one minute apart and record the results. You may also need to have your blood pressure checked regularly by your health care provider.   General information  Talk with your health care provider about your diet, exercise habits, and other lifestyle factors that may be contributing to hypertension.  Review all the medicines you take with your health care provider because there may be side effects or interactions.  Keep all visits  as told by your health care provider. Your health care provider can help you create and adjust your  plan for managing your high blood pressure. Where to find more information  National Heart, Lung, and Blood Institute: https://wilson-eaton.com/  American Heart Association: www.heart.org Contact a health care provider if:  You think you are having a reaction to medicines you have taken.  You have repeated (recurrent) headaches.  You feel dizzy.  You have swelling in your ankles.  You have trouble with your vision. Get help right away if:  You develop a severe headache or confusion.  You have unusual weakness or numbness, or you feel faint.  You have severe pain in your chest or abdomen.  You vomit repeatedly.  You have trouble breathing. These symptoms may represent a serious problem that is an emergency. Do not wait to see if the symptoms will go away. Get medical help right away. Call your local emergency services (911 in the [place] Do not drive yourself to the hospital. Summary  Hypertension is when the force of blood pumping through your arteries is too strong. If this condition is not controlled, it may put you at risk for serious complications.  Your personal target blood pressure may vary depending on your medical conditions, your age, and other factors. For most people, a normal blood pressure is less than 120/80.  Hypertension is managed by lifestyle changes, medicines, or both.  Lifestyle changes to help manage hypertension include losing weight, eating a healthy, low-sodium diet, exercising more, stopping smoking, and limiting alcohol. This information is not intended to replace advice given to you by your health care provider. Make sure you discuss any questions you have with your health care provider. Document Revised: 10/17/2019 Document Reviewed: 08/12/2019 Elsevier Patient Education  2021 [place].  Heart Failure Exacerbation  Heart failure is a condition in which the heart has trouble pumping blood. This may mean that the heart cannot pump enough blood out to  the body or that the heart does not fill up with enough blood. When this happens, parts of the body do not get the blood and oxygen they need to function properly. This can cause symptoms such as breathing problems, tiredness (fatigue), swelling, and confusion. Heart failure exacerbation refers to heart failure symptoms that get worse. The symptoms may get worse suddenly or develop slowly over time. Heart failure exacerbation is a serious medical problem that should be treated right away. What are the causes? A heart failure exacerbation can be triggered by:  Not taking your heart failure medicines correctly.  Infections.  Eating an unhealthy diet or a diet that is high in salt (sodium).  Drinking too much fluid.  Drinking alcohol.  Using drugs, such as cocaine or methamphetamine.  Not exercising. Other causes include:  Other heart conditions such as an irregular heart rhythm (arrhythmia).  Worsening heart valve function.  Low blood counts (anemia).  Other medical problems, such as kidney failure, thyroid problems, or diabetes mellitus. Sometimes the cause of the exacerbation is not known. What are the signs or symptoms? When heart failure symptoms suddenly or slowly get worse, this may be a sign of heart failure exacerbation. Symptoms of heart failure include:  Shortness of breath during activity or exercise.  A cough that does not go away.  Swelling of the legs, ankles, feet, or abdomen.  Losing or gaining weight for no reason.  Trouble breathing when lying down.  Increased heart rate or irregular heartbeat.  Fatigue.  Feeling light-headed,  dizzy, or close to fainting.  Nausea or lack of appetite. How is this diagnosed? This condition is diagnosed based on:  Your symptoms and medical history.  A physical exam. You may also have tests, including:  Electrocardiogram (ECG). This test measures the electrical activity of your heart.  Echocardiogram. This test  uses sound waves to take a picture of your heart to see how well it works.  Blood tests.  Imaging tests, such as: ? Chest X-ray. ? MRI. ? Ultrasound.  Stress test. This test examines how well your heart functions while you exercise on a treadmill or exercise bike. If you cannot exercise, medicines may be used to increase your heartbeat in place of exercise.  Cardiac catheterization. During this test, a thin, flexible tube (catheter) is inserted into a blood vessel and threaded up to your heart. This test allows your health care provider to check the arteries that lead to your heart (coronary arteries).  Right heart catheterization. During this test, the pressure in your heart is measured. How is this treated? This condition may be treated by:  Adjusting your heart medicines.  Maintaining a healthy lifestyle. This includes: ? Eating a heart-healthy diet that is low in sodium. ? Not using products that contain nicotine or tobacco. ? Regular exercise. ? Monitoring your fluid intake. ? Monitoring your weight and reporting changes to your health care provider. ? Not using alcohol or drugs.  Treating sleep apnea, if you have this condition.  Surgery. This may include: ? Placing a pacemaker to improve heart function (cardiac resynchronization therapy). ? Implanting a device that can correct heart rhythm problems (implantable cardioverter defibrillator). ? Implanting a pulmonary arterial pressure monitor to monitor your fluid balance. ? Connecting a device to your heart to help it pump blood (ventricular assist device). ? Heart transplant. Follow these instructions at home: Medicines  Take over-the-counter and prescription medicines only as told by your health care provider.  Do not stop taking your medicines or change the amount you take. If you are having problems or side effects from your medicines, talk to your health care provider.  If you are having difficulty paying for your  medicines, contact a social worker or your clinic. There are many programs to assist with medicine costs.  Talk to your health care provider before starting any new medicines or supplements.  Make sure your health care provider and pharmacist have a list of all the medicines you are taking. Eating and drinking  Avoid drinking alcohol.  Eat a heart-healthy diet as told by your health care provider. This includes: ? Plenty of fruits and vegetables. ? Lean proteins. ? Low-fat dairy. ? Whole grains. ? Foods that are low in sodium.   Activity  Exercise regularly as told by your health care provider. Balance exercise with rest.  Ask your health care provider what activities are safe for you. This includes sexual activity, exercise, and daily tasks at home or work.   Lifestyle  Do not use any products that contain nicotine or tobacco. These products include cigarettes, chewing tobacco, and vaping devices, such as e-cigarettes. If you need help quitting, ask your health care provider.  Maintain a healthy weight. Ask your health care provider what weight is healthy for you.  Consider joining a patient support group. This can help with emotional problems you may have, such as stress and anxiety.  Do not use drugs. General instructions  Stay up to date with vaccines. Talk to your health care provider about flu  and pneumonia vaccines.  Keep a list of medicines that you are taking. This may help in emergency situations.  Keep all follow-up visits. This is important. Contact a health care provider if:  You have questions about your medicines or you miss a dose.  You feel anxious, depressed, or stressed.  You develop swelling in your feet, ankles, legs, or abdomen.  You develop a cough.  You have a fever.  You have trouble sleeping.  You gain 2-3 lb (1-1.4 kg) in 24 hours or 5 lb (2.3 kg) in a week. Get help right away if:  You have chest pain or pressure.  You have shortness  of breath while resting.  You have severe fatigue.  You are confused.  You have severe dizziness.  You have a rapid or irregular heartbeat.  You have nausea or you vomit.  You have a cough that is worse at night or you cannot lie flat.  You have severe depression or sadness. These symptoms may represent a serious problem that is an emergency. Do not wait to see if the symptoms will go away. Get medical help right away. Call your local emergency services (911 in the [place] Do not drive yourself to the hospital. Summary  When heart failure symptoms get worse, it is called heart failure exacerbation.  Common causes of this condition include taking medicines incorrectly, infections, and drinking alcohol.  This condition may be treated by adjusting medicines, maintaining a healthy lifestyle, or surgery.  Do not stop taking your medicines or change the amount you take. If you are having problems or side effects from your medicines, talk to your health care provider. This information is not intended to replace advice given to you by your health care provider. Make sure you discuss any questions you have with your health care provider. Document Revised: 04/03/2020 Document Reviewed: 04/03/2020 Elsevier Patient Education  [place].

## 2020-10-29 NOTE — Chronic Care Management (AMB) (Signed)
Chronic Care Management   CCM RN Visit Note  10/29/2020 Name: [name] [name] MRN: [id number] DOB: 10-19-1931  Subjective: [name] [name] is a 87 y.o. year old female who is a primary care patient of Fayrene Helper, MD. The care management team was consulted for assistance with disease management and care coordination needs.    Engaged with patient by telephone for initial visit in response to provider referral for case management and/or care coordination services.   Consent to Services:  The patient was given the following information about Chronic Care Management services today, agreed to services, and gave verbal consent: 1. CCM service includes personalized support from designated clinical staff supervised by the primary care provider, including individualized plan of care and coordination with other care providers 2. 24/7 contact phone numbers for assistance for urgent and routine care needs. 3. Service will only be billed when office clinical staff spend 20 minutes or more in a month to coordinate care. 4. Only one practitioner may furnish and bill the service in a calendar month. 5.The patient may stop CCM services at any time (effective at the end of the month) by phone call to the office staff. 6. The patient will be responsible for cost sharing (co-pay) of up to 20% of the service fee (after annual deductible is met). Patient agreed to services and consent obtained.  Patient agreed to services and verbal consent obtained.   Assessment: Review of patient past medical history, allergies, medications, health status, including review of consultants reports, laboratory and other test data, was performed as part of comprehensive evaluation and provision of chronic care management services.   SDOH (Social Determinants of Health) assessments and interventions performed:    CCM Care Plan  Allergies  Allergen Reactions  . Penicillins Other (See Comments)    Bruise    . Aspirin Other  (See Comments)    On coumadin  . Fentanyl Dermatitis    Rash with patch   . Niacin Itching    Outpatient Encounter Medications as of 10/29/2020  Medication Sig Note  . acetaminophen (TYLENOL) 500 MG tablet Take 1 tablet (500 mg total) by mouth every 6 (six) hours as needed (pain).   Marland Kitchen albuterol (PROVENTIL) (2.5 MG/3ML) 0.083% nebulizer solution Take 3 mLs (2.5 mg total) by nebulization in the morning and at bedtime. *May use additional one time if needed for shortness of breath   . albuterol (VENTOLIN HFA) 108 (90 Base) MCG/ACT inhaler Inhale 1 puff into the lungs every 6 (six) hours as needed for wheezing or shortness of breath.   . allopurinol (ZYLOPRIM) 100 MG tablet TAKE ONE TABLET BY MOUTH ONCE DAILY.   Marland Kitchen BREO ELLIPTA 100-25 MCG/INH AEPB INHALE (1) PUFF BY MOUTH INTO THE LUNGS DAILY   . fluticasone (FLONASE) 50 MCG/ACT nasal spray Place 2 sprays into both nostrils daily.   . folic acid (FOLVITE) 1 MG tablet TAKE ONE TABLET BY MOUTH ONCE DAILY.   . furosemide (LASIX) 40 MG tablet TAKE ONE TABLET BY MOUTH ONCE DAILY. (Patient taking differently: Take 40 mg by mouth 2 (two) times daily.)   . gabapentin (NEURONTIN) 300 MG capsule TAKE (1) CAPSULE BY MOUTH TWICE DAILY   . hydrALAZINE (APRESOLINE) 25 MG tablet Take 25 mg by mouth 3 (three) times daily.   Marland Kitchen HYDROcodone-homatropine (HYCODAN) 5-1.5 MG/5ML syrup Take 5 mLs by mouth every 8 (eight) hours as needed for cough.   . levETIRAcetam (KEPPRA) 500 MG tablet TAKE (1) TABLET BY MOUTH TWICE DAILY AT 9AM AND  9PM   . Liniments (SALONPAS PAIN RELIEF PATCH EX) Apply 1 patch topically daily as needed (pain).    . metoprolol tartrate (LOPRESSOR) 50 MG tablet TAKE (1) TABLET BY MOUTH TWICE DAILY AT 9AM AND 9PM.   . nitroGLYCERIN (NITROSTAT) 0.4 MG SL tablet DISSOLVE 1 TABLET SUBLINGUALLY AS NEEDED FOR CHEST PAIN, MAY REPEAT EVERY 5 MINUTES. AFTER 3 CALL 911.   . ondansetron (ZOFRAN-ODT) 4 MG disintegrating tablet TAKE (1) TABLET BY MOUTH EVERY EIGHT  HOURS AS NEEDED FOR NAUSEA OR VOMITING   . OXYGEN Inhale 3 L into the lungs continuous.   . polyethylene glycol (MIRALAX / GLYCOLAX) 17 g packet Take 17 g by mouth daily as needed.   . potassium chloride SA (KLOR-CON) 20 MEQ tablet TAKE ONE TABLET BY MOUTH ONCE DAILY.   . pravastatin (PRAVACHOL) 40 MG tablet TAKE ONE TABLET BY MOUTH ONCE DAILY.   Marland Kitchen senna (SENOKOT) 8.6 MG TABS tablet Take 2 tablets (17.2 mg total) by mouth daily as needed for mild constipation.   Marland Kitchen SYNTHROID 88 MCG tablet TAKE 1 TABLET BEFORE BREAKFAST.   Marland Kitchen warfarin (COUMADIN) 10 MG tablet TAKE (1) TABLET BY MOUTH DAILY AT 4PM   . benzonatate (TESSALON) 100 MG capsule Take 1 capsule (100 mg total) by mouth 2 (two) times daily as needed for cough. (Patient not taking: Reported on 10/29/2020) 09/22/2020: Reports she has confirmed that her outpatient pharmacy will deliver to her today  . cholecalciferol (VITAMIN D3) 25 MCG (1000 UNIT) tablet Take 1 tablet (1,000 Units total) by mouth in the morning. (Patient not taking: Reported on 10/29/2020)   . levofloxacin (LEVAQUIN) 500 MG tablet Take 1 tablet (500 mg total) by mouth daily. (Patient not taking: Reported on 10/29/2020) 09/22/2020: Reports that she has confirmed that her outpatient pharmacy will deliver to her today  . NON FORMULARY Diet Change: Soft diet with dys 2 (ground) meats, thin liquids (NAS, cons CHO, heart healthy) (Patient not taking: Reported on 10/29/2020)   . ondansetron (ZOFRAN) 4 MG tablet Take by mouth at bedtime. (Patient not taking: Reported on 10/29/2020)   . Polyethyl [name] (SYSTANE) 0.4-0.3 % SOLN Apply 1 drop to eye at bedtime. Left eye (Patient not taking: Reported on 10/29/2020)   . predniSONE (DELTASONE) 20 MG tablet Take 1 tablet (20 mg total) by mouth 2 (two) times daily. (Patient not taking: Reported on 10/29/2020) 09/22/2020: Reports took all doses as prescribed and completed course  . UNABLE TO FIND Compression Stockings R-  Ankle  26.5cm   Calf 38cm   Leg  44cm L-  Ankle  27.5 cm   Calf 39cm  Leg  44cm (Patient not taking: Reported on 10/29/2020)    Facility-Administered Encounter Medications as of 10/29/2020  Medication  . epoetin alfa (EPOGEN,PROCRIT) injection 40,000 Units    Patient Active Problem List   Diagnosis Date Noted  . Gait abnormality 08/24/2020  . Chronic constipation 08/09/2020  . Acute UTI 08/03/2020  . Hypertensive heart and kidney disease with chronic systolic congestive heart failure and stage 4 chronic kidney disease ([place]) 07/15/2020  . Hyperlipidemia associated with type 2 diabetes mellitus ([place]) 07/15/2020  . Seizure ([place]) 07/07/2020  . Goals of care, counseling/discussion   . Palliative care by specialist   . Idiopathic hypotension   . CHF exacerbation ([place]) 06/29/2020  . Arthritis of right knee 06/20/2020  . Leg swelling 06/16/2020  . Generalized osteoarthritis 05/09/2020  . Primary osteoarthritis, left shoulder 04/21/2020  . Acute systolic CHF (congestive heart failure) ([place])  03/31/2020  . Bilateral lower extremity edema   . Acute on chronic congestive heart failure ([place]) 03/30/2020  . Right-sided epistaxis 03/28/2020  . Acute pain of left shoulder 01/15/2020  . AAA (abdominal aortic aneurysm) without rupture ([place]) 01/14/2020  . COVID-19 virus detected 11/23/2019  . Stage 3b chronic kidney disease ([place]) 08/04/2019  . Dyspnea 05/30/2019  . Anemia 05/30/2019  . Gout   . Glaucoma   . GERD (gastroesophageal reflux disease)   . Physical debility 04/28/2019  . Encounter for examination following treatment at hospital 11/03/2018  . Hyponatremia 09/27/2018  . H/O mitral valve replacement with mechanical valve 08/02/2018  . Chronic right hip pain 04/16/2018  . Chronic systolic congestive heart failure ([place]) 11/15/2017  . COPD with acute exacerbation ([place]) 11/15/2017  . Trochanteric bursitis, right hip 03/07/2017  . Persistent atrial fibrillation ([place])   . Nocturnal hypoxia 10/08/2016  . Dependence on  nocturnal oxygen therapy 10/08/2016  . Hematuria 03/20/2016  . S/P ICD (internal cardiac defibrillator) procedure 02/08/2015  . Presence of cardiac pacemaker 02/08/2015  . CHB (complete heart block) ([place]) 10/07/2014  . Fatigue 07/20/2014  . Absolute anemia 06/24/2014  . Osteopenia 02/10/2014  . Encounter for therapeutic drug monitoring 10/29/2013  . OA (osteoarthritis) of knee 02/19/2013  . Chronic pain of right knee 01/02/2013  . Hemolytic anemia ([place]) 09/24/2012  . High risk medication use 05/24/2012  . COPD (chronic obstructive pulmonary disease) ([place]) 05/19/2012  . Chronic anticoagulation, on coumadin for Mechanical AVR and MVR 04/01/2012  . Cardiorenal syndrome with renal failure 03/22/2012  . Hx of aortic valve replacement, mechanical 03/22/2012  . S/P MVR (mitral valve replacement) 03/22/2012  . Biventricular automatic implantable cardioverter defibrillator in situ 03/22/2012  . CKD (chronic kidney disease), stage IV ([place]) 03/22/2012  . Warfarin-induced coagulopathy ([place]) 03/21/2012  . Iron deficiency 10/04/2011  . Allergic rhinitis 02/14/2011  . Mitral valve disease 07/15/2010  . Sinus node dysfunction ([place]) 07/15/2010  . Type 2 diabetes mellitus with hypoglycemia without coma ([place]) 07/15/2010  . VARICOSE VEINS LOWER EXTREMITIES W/OTH COMPS 02/09/2009  . Prediabetes 06/15/2008  . Hypothyroidism, postsurgical 02/24/2008  . Hyperlipidemia LDL goal <70 02/24/2008  . Essential hypertension 02/24/2008  . Non-ischemic cardiomyopathy with ICD 02/24/2008  . Spondylosis 02/24/2008    Conditions to be addressed/monitored:CHF and HTN  Care Plan : Heart Failure (Adult)  Updates made by Kassie Mends, RN since 10/29/2020 12:00 AM    Problem: Symptom Exacerbation (Heart Failure)     Long-Range Goal: Symptom Exacerbation Prevented or Minimized   Start Date: 10/29/2020  Expected End Date: 04/28/2021  This Visit's Progress: On track  Priority: High  Note:   Current Barriers:   Marland Kitchen Knowledge deficits related to basic heart failure pathophysiology and self care management as evidenced by patient weighing every other day instead of daily Nurse Case Manager Clinical Goal(s):   Over the next 30 days, patient will weigh self daily and record  Over the next 60 days, patient will verbalize understanding of Heart Failure Action Plan and when to call doctor Interventions:  . Collaboration with Fayrene Helper, MD regarding development and update of comprehensive plan of care as evidenced by provider attestation and co-signature . Inter-disciplinary care team collaboration (see longitudinal plan of care) . Basic overview and discussion of pathophysiology of Heart Failure . Provided verbal education on low sodium diet . Reviewed Heart Failure Action Plan in depth and provided written copy . Assessed for scales in home . Discussed importance of daily weight and  recording . Reviewed role of diuretics in prevention of fluid overload . Contacted primary care provider about compression hose and if prescription sent to pharmacy . Reviewed medications with patient and home care aide and encouraged compliance Patient Goals/Self-Care Activities . Over the next 30 days, patient will:  . follow heart failure action plan if symptoms flare up . weigh daily and record. . know when to call the doctor- call for weight gain of 3 pounds in one day or 5 pounds in one week. . Call [facility] for any questions at [phone] . Wear support hose when you get them from pharmacy . Take medications as prescribed Follow Up Plan: Telephone follow up appointment with care management team member scheduled for: 11/26/2020     Care Plan : Hypertension (Adult)  Updates made by Kassie Mends, RN since 10/29/2020 12:00 AM    Problem: Hypertension (Hypertension)   Priority: Medium    Long-Range Goal: Hypertension Monitored   Start Date: 10/29/2020  Expected End Date: 04/28/2021  This Visit's  Progress: On track  Priority: Medium  Note:   Objective:  . Last practice recorded BP readings:  BP Readings from Last 3 Encounters:  10/27/20 (!) 153/77  10/26/20 (!) 145/75  09/28/20 133/72   . Most recent eGFR/CrCl: No results found for: EGFR  No components found for: CRCL Current Barriers:  Marland Kitchen Knowledge Deficits related to basic understanding of hypertension pathophysiology and self care management as evidenced by continued reports of elevated blood pressures . Unable to perform ADLs independently- has [facility] home care aide that assists with checking blood pressure several times weekly Case Manager Clinical Goal(s):  Marland Kitchen Over the next 60 days, patient will demonstrate improved adherence to prescribed treatment plan for hypertension as evidenced by taking all medications as prescribed, monitoring and recording blood pressure as directed, adhering to low sodium/DASH diet Interventions:  . Collaboration with Fayrene Helper, MD regarding development and update of comprehensive plan of care as evidenced by provider attestation and co-signature . Inter-disciplinary care team collaboration (see longitudinal plan of care) . Reviewed medications with patient and discussed importance of compliance . Advised patient, providing education and rationale, to monitor blood pressure daily and record, calling PCP for findings outside established parameters.  . Reviewed low salt diet and importance of adherence Patient Goals/Self-Care Activities . Over the next 60 days, patient will:-   . check blood pressure 3 times per week with the assistance of [facility] home aide . write blood pressure results in a log or diary  . follow a low salt diet- avoid fast food, salty chips, fries, etc.  Follow Up Plan: Telephone follow up appointment with care management team member scheduled for: 11/26/2020     Plan:Telephone follow up appointment with care management team member scheduled for:  11/26/2020  Jacqlyn Larsen [facility],  BSN RN Care Manager Coordinator [facility] [phone]

## 2020-11-01 NOTE — Progress Notes (Signed)
This encounter was created in error - please disregard.

## 2020-11-02 ENCOUNTER — Telehealth: Payer: Self-pay | Admitting: *Deleted

## 2020-11-02 ENCOUNTER — Ambulatory Visit (INDEPENDENT_AMBULATORY_CARE_PROVIDER_SITE_OTHER): Payer: Medicare Other | Admitting: *Deleted

## 2020-11-02 DIAGNOSIS — N184 Chronic kidney disease, stage 4 (severe): Secondary | ICD-10-CM | POA: Diagnosis not present

## 2020-11-02 DIAGNOSIS — Z952 Presence of prosthetic heart valve: Secondary | ICD-10-CM

## 2020-11-02 DIAGNOSIS — I4819 Other persistent atrial fibrillation: Secondary | ICD-10-CM | POA: Diagnosis not present

## 2020-11-02 DIAGNOSIS — Z5181 Encounter for therapeutic drug level monitoring: Secondary | ICD-10-CM

## 2020-11-02 DIAGNOSIS — N179 Acute kidney failure, unspecified: Secondary | ICD-10-CM | POA: Diagnosis not present

## 2020-11-02 DIAGNOSIS — E1122 Type 2 diabetes mellitus with diabetic chronic kidney disease: Secondary | ICD-10-CM | POA: Diagnosis not present

## 2020-11-02 DIAGNOSIS — I130 Hypertensive heart and chronic kidney disease with heart failure and stage 1 through stage 4 chronic kidney disease, or unspecified chronic kidney disease: Secondary | ICD-10-CM | POA: Diagnosis not present

## 2020-11-02 DIAGNOSIS — I5043 Acute on chronic combined systolic (congestive) and diastolic (congestive) heart failure: Secondary | ICD-10-CM | POA: Diagnosis not present

## 2020-11-02 LAB — POCT INR: INR: 2.1 (ref 2.0–3.0)

## 2020-11-02 NOTE — Telephone Encounter (Signed)
Spoke with [facility].  Coumadin orders given.  See coumadin note.

## 2020-11-02 NOTE — Telephone Encounter (Signed)
INR 2.1 PT 25.4  Per Janett Billow w/ [facility] - [phone]

## 2020-11-02 NOTE — Patient Instructions (Signed)
Spoke with [facility]. Continue warfarin 1 tablet  (10mg ) daily except for 1/2 a tablet (5mg )  on Monday, Wednesday and Friday. [place] will recheck INR on 11/09/20

## 2020-11-04 ENCOUNTER — Other Ambulatory Visit: Payer: Self-pay

## 2020-11-04 DIAGNOSIS — M7989 Other specified soft tissue disorders: Secondary | ICD-10-CM

## 2020-11-04 DIAGNOSIS — R600 Localized edema: Secondary | ICD-10-CM

## 2020-11-04 DIAGNOSIS — R609 Edema, unspecified: Secondary | ICD-10-CM

## 2020-11-04 MED ORDER — UNABLE TO FIND: 0 refills | Status: DC

## 2020-11-08 ENCOUNTER — Other Ambulatory Visit: Payer: Self-pay | Admitting: Family Medicine

## 2020-11-09 ENCOUNTER — Ambulatory Visit (INDEPENDENT_AMBULATORY_CARE_PROVIDER_SITE_OTHER): Payer: Medicare Other | Admitting: *Deleted

## 2020-11-09 DIAGNOSIS — N179 Acute kidney failure, unspecified: Secondary | ICD-10-CM | POA: Diagnosis not present

## 2020-11-09 DIAGNOSIS — E1122 Type 2 diabetes mellitus with diabetic chronic kidney disease: Secondary | ICD-10-CM | POA: Diagnosis not present

## 2020-11-09 DIAGNOSIS — Z952 Presence of prosthetic heart valve: Secondary | ICD-10-CM | POA: Diagnosis not present

## 2020-11-09 DIAGNOSIS — I4819 Other persistent atrial fibrillation: Secondary | ICD-10-CM | POA: Diagnosis not present

## 2020-11-09 DIAGNOSIS — Z5181 Encounter for therapeutic drug level monitoring: Secondary | ICD-10-CM

## 2020-11-09 DIAGNOSIS — I130 Hypertensive heart and chronic kidney disease with heart failure and stage 1 through stage 4 chronic kidney disease, or unspecified chronic kidney disease: Secondary | ICD-10-CM | POA: Diagnosis not present

## 2020-11-09 DIAGNOSIS — I5043 Acute on chronic combined systolic (congestive) and diastolic (congestive) heart failure: Secondary | ICD-10-CM | POA: Diagnosis not present

## 2020-11-09 DIAGNOSIS — N184 Chronic kidney disease, stage 4 (severe): Secondary | ICD-10-CM | POA: Diagnosis not present

## 2020-11-09 LAB — POCT INR: INR: 1.8 — AB (ref 2.0–3.0)

## 2020-11-09 IMAGING — DX DG CHEST 1V PORT
1 series · 1 of 1 positions shown · non-contrast
Comparison: 08/02/2019

CLINICAL DATA: Shortness of breath

EXAM:
PORTABLE CHEST 1 VIEW

[chest ap]
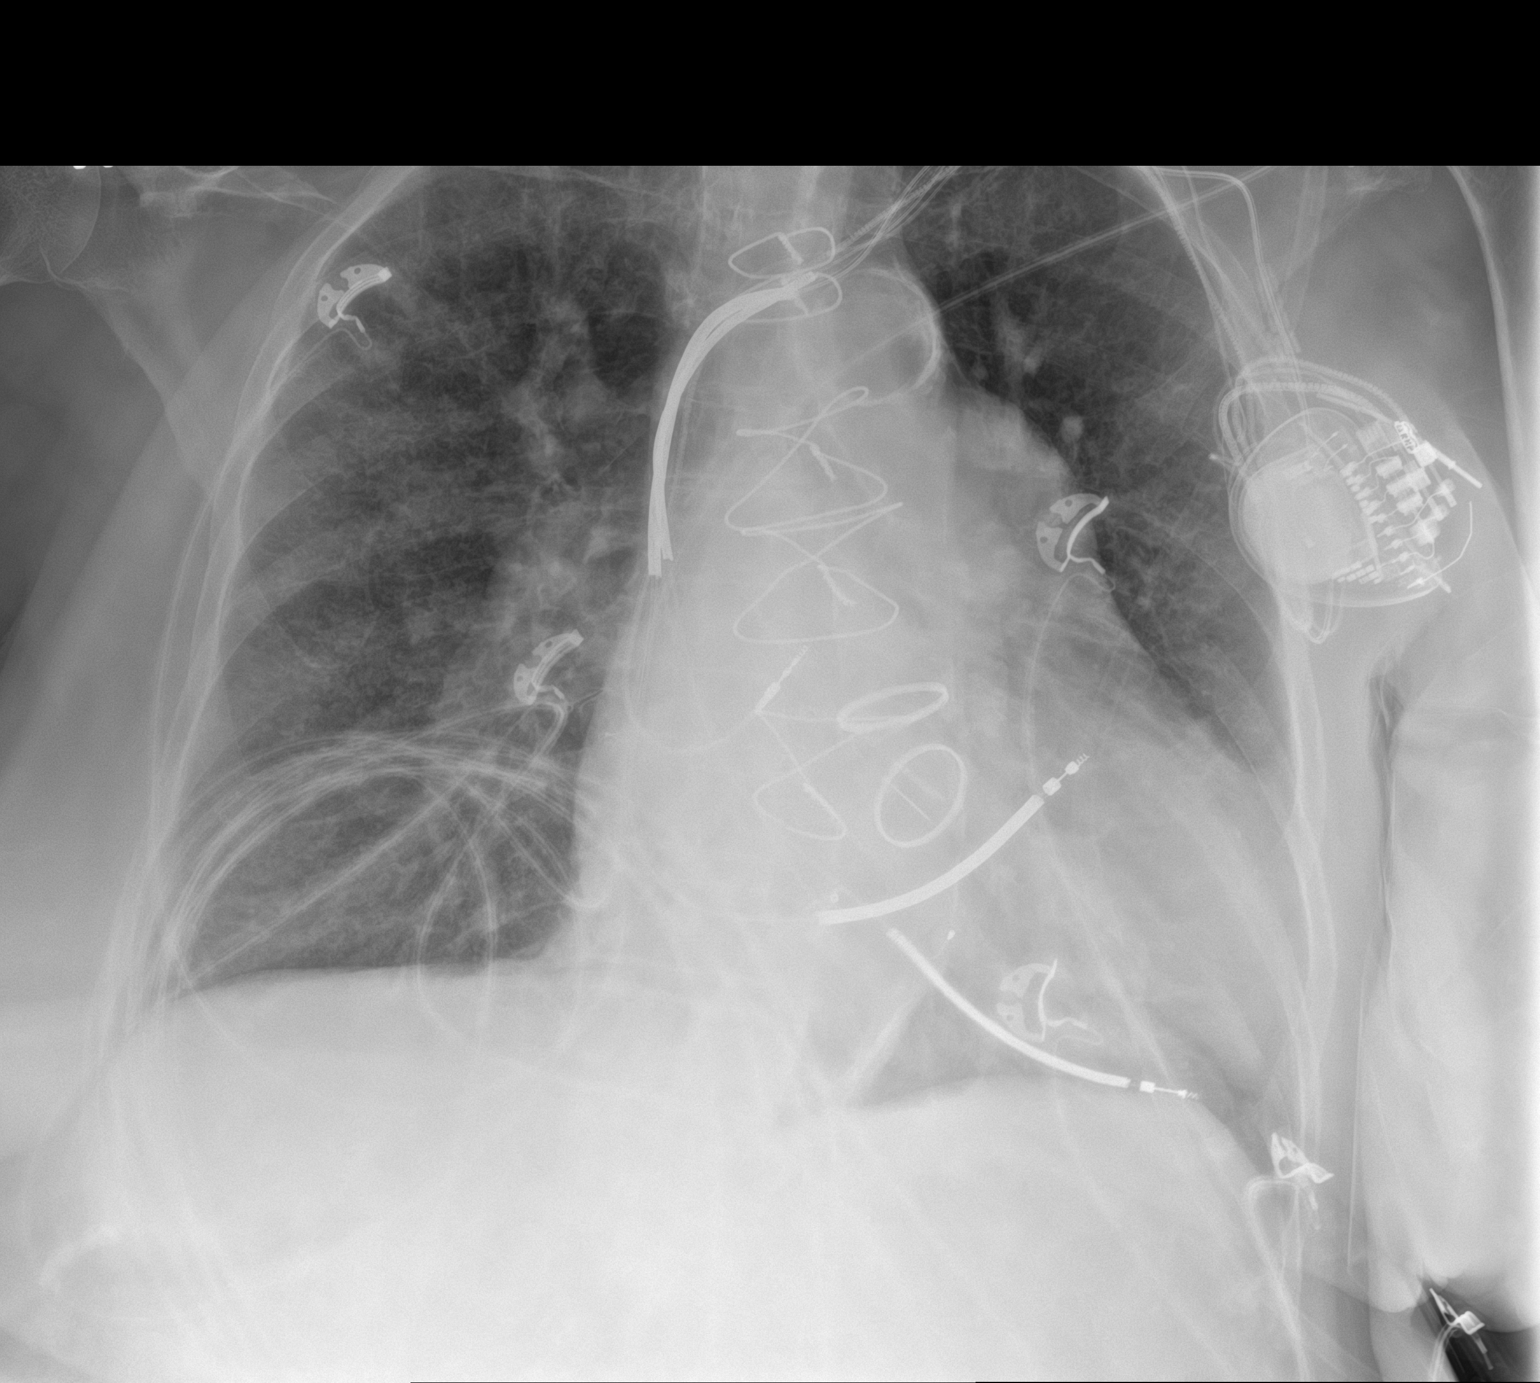

[1 of 1 positions shown; findings below may reference images not displayed]

FINDINGS: Chronic interstitial prominence with reticular opacities. No pleural
effusion or pneumothorax. Stable cardiomegaly and enlargement of the
main pulmonary artery. Aortic and mitral valve replacements and left
chest wall biventricular ICD.
IMPRESSION: Chronic interstitial changes. Cardiomegaly. Pulmonary arterial
hypertension.

## 2020-11-09 NOTE — Patient Instructions (Signed)
Spoke with [facility] RN [place]. Take warfarin 1 1/2 tablets (15mg ) tonight then resume 1 tablet  (10mg ) daily except for 1/2 tablet (5mg )  on Monday, Wednesday and Friday. [place] [facility] will recheck INR on 11/15/20

## 2020-11-11 ENCOUNTER — Ambulatory Visit (INDEPENDENT_AMBULATORY_CARE_PROVIDER_SITE_OTHER): Payer: Medicare Other | Admitting: Nurse Practitioner

## 2020-11-11 ENCOUNTER — Encounter: Payer: Self-pay | Admitting: Nurse Practitioner

## 2020-11-11 ENCOUNTER — Other Ambulatory Visit: Payer: Self-pay

## 2020-11-11 VITALS — BP 138/69 | HR 67 | Temp 98.3°F | Resp 16 | Ht 67.0 in | Wt 190.0 lb

## 2020-11-11 DIAGNOSIS — I5022 Chronic systolic (congestive) heart failure: Secondary | ICD-10-CM

## 2020-11-11 DIAGNOSIS — I10 Essential (primary) hypertension: Secondary | ICD-10-CM

## 2020-11-11 DIAGNOSIS — J42 Unspecified chronic bronchitis: Secondary | ICD-10-CM

## 2020-11-11 DIAGNOSIS — E1165 Type 2 diabetes mellitus with hyperglycemia: Secondary | ICD-10-CM

## 2020-11-11 MED ORDER — POTASSIUM CHLORIDE CRYS ER 20 MEQ PO TBCR: 20.0000 meq | EXTENDED_RELEASE_TABLET | Freq: Every day | ORAL | 0 refills | Status: DC

## 2020-11-11 MED ORDER — FUROSEMIDE 40 MG PO TABS: 40.0000 mg | ORAL_TABLET | Freq: Two times a day (BID) | ORAL | 3 refills | Status: DC

## 2020-11-11 NOTE — Assessment & Plan Note (Signed)
-  well controlled today -no change to medication

## 2020-11-11 NOTE — Progress Notes (Signed)
Established Patient Office Visit  Subjective:  Patient ID: [name] [name], female    DOB: 1931-11-29  Age: 87 y.o. MRN: [id number]  CC:  Chief Complaint  Patient presents with  . Follow-up    CHF, COPD, HTN     HPI [name] [name] presents for follow-up. At her last visit, she had just got out of rehab for heart failure. She needs refills on lasix. She states she is doing better and her legs are much less swollen. She still dets fatigued with exertion. She missed her appointment with [place] several times.  Past Medical History:  Diagnosis Date  . Acute on chronic diastolic CHF (congestive heart failure) ([place]) 05/19/2012  . Acute on chronic respiratory failure with hypoxia ([place]) 01/31/2020  . Adenomatous polyp of colon 12/16/2002   Dr. [name]/[facility]  . Allergy   . Calculus of gallbladder with chronic cholecystitis without obstruction 02/09/2009   Qualifier: Diagnosis of  By: [name], [name]    . Cataract   . CHB (complete heart block) ([place]) 10/07/2014      . CHF (congestive heart failure) ([place])    a.  EF previously reduced to 25-30% b. EF improved to 60-65% by echo in 10/2017.   Marland Kitchen Chronic gout due to renal impairment    Per [facility] Electronic [facility] Records [facility]   . Chronic kidney disease, stage I 2006  . Cognitive communication deficit    Per [facility] Records [facility]   . Constipation       . Convulsion ([place])    Per [facility] Records [facility]   . COPD (chronic obstructive pulmonary disease) ([place])       . DJD (degenerative joint disease) of lumbar spine   . DM (diabetes mellitus) ([place]) 2006  . Dysphagia, unspecified    Per [facility] Electronic [facility] Records [facility]   . Esophageal reflux   . Glaucoma   . Gout       . H/O adenomatous polyp of colon 05/24/2012   2004 in [place] colonoscopy 2008  normal    . H/O heart valve replacement with mechanical valve    a. s/p mechanical AVR in 2001 and mechanical MVR in 2004.  Marland Kitchen H/O wisdom tooth extraction   . HIP PAIN, RIGHT 04/23/2009   Qualifier: Diagnosis of  By: [name], [name]    . Hyperkalemia    Per [facility] Records [facility]   . Hyperlipemia   . Hypertensive heart and kidney disease with HF and with CKD stage I-IV ([place])    Per [facility] Records [facility]   . IDA (iron deficiency anemia)    Parenteral iron/[name]  . Insomnia       . Localized edema    Left arm, Per [facility] Records [facility]   . Long term (current) use of anticoagulants    Per [facility] Records [facility]   . Nausea without vomiting 06/07/2010   Qualifier: Diagnosis of  By: [name], [name]    . Non-ischemic cardiomyopathy ([place])    a. s/p MDT CRTD  . Obesity, unspecified   . Oxygen deficiency 2014   nocturnal;  . Papilloma of breast 03/06/2012   This was diagnosed as a left breast papilloma by ultrasound characteristics and symptoms in 2010. Because of possible medical risk  factors no surgical intervention has been done and it did doing annual followups. The area has been stable by physical examination and mammograms and ultrasound since 2010.   . Presence of cardiac pacemaker    Per [facility] Electronic [facility] Records [facility]   . Spinal stenosis of lumbar region 02/19/2013  . Spondylolisthesis   . Spondylosis   . Thyroid cancer ([place])    remote thyroidectomy, no recurrence, pt denies in 2016 thyroid cancer  . Type 2 diabetes mellitus with hyperglycemia ([place]) 07/15/2010  . Type 2 diabetes mellitus with hypoglycemia without coma ([place])    Per [facility] Records [facility]   . Unspecified chronic bronchitis ([place])    Per [facility]  Electronic Medical Records System   . Unspecified hypothyroidism       . Urinary tract infection, site not specified    Per [facility] Records [facility]   . 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 Vertigo         Past Surgical History:  Procedure Laterality Date  . AORTIC AND MITRAL VALVE REPLACEMENT  2001  . BI-VENTRICULAR IMPLANTABLE CARDIOVERTER DEFIBRILLATOR UPGRADE N/A 01/18/2015   Procedure: BI-VENTRICULAR IMPLANTABLE CARDIOVERTER DEFIBRILLATOR UPGRADE;  Surgeon: Evans Lance, MD;  Location: [facility] CATH LAB;  Service: Cardiovascular;  Laterality: N/A;  . BIV ICD GENERATOR CHANGEOUT N/A 01/30/2019   Procedure: BIV ICD GENERATOR CHANGEOUT;  Surgeon: Evans Lance, MD;  Location: [place] CV LAB;  Service: Cardiovascular;  Laterality: N/A;  . CARDIAC CATHETERIZATION    . CARDIAC VALVE REPLACEMENT    . CARDIOVERSION N/A 11/13/2016   Procedure: CARDIOVERSION;  Surgeon: Sanda Klein, MD;  Location: [facility] ENDOSCOPY;  Service: Cardiovascular;  Laterality: N/A;  . COLONOSCOPY  06/12/2007   [place]ourk- Normal rectum/Normal colon  . COLONOSCOPY  12/16/02   small hemorrhoids  . COLONOSCOPY  06/20/2012   Procedure: COLONOSCOPY;  Surgeon: Daneil Dolin, MD;  Location: [facility] ENDO [facility];  Service: Endoscopy;  Laterality: N/A;  10:45  . defibrillator implanted 2006    . DOPPLER ECHOCARDIOGRAPHY  2012  . DOPPLER ECHOCARDIOGRAPHY  05,06,07,08,09,2011  . ESOPHAGOGASTRODUODENOSCOPY  06/12/2007   Rourk- normal esophagus, small hiatal hernia, otherwise normal stomach, D1, D2  . EYE SURGERY Right 2005  . EYE SURGERY Left 2006  . ICD LEAD REMOVAL Left 02/08/2015   Procedure: ICD LEAD REMOVAL;  Surgeon: Evans Lance, MD;  Location[facility];  Service: Cardiovascular;  Laterality: Left;  "Will plan extraction and insertion of a BiV PM"  **Dr. Roxan Hockey backing up case**  . IMPLANTABLE CARDIOVERTER DEFIBRILLATOR (ICD) GENERATOR CHANGE Left 02/08/2015    Procedure: ICD GENERATOR CHANGE;  Surgeon: Evans Lance, MD;  Location: [place];  Service: Cardiovascular;  Laterality: Left;  . INSERT / REPLACE / REMOVE PACEMAKER    . PACEMAKER INSERTION  May 2016  . pacemaker placed  2004  . right breast cyst removed     benign   . right cataract removed     2005  . TEE WITHOUT CARDIOVERSION N/A 11/13/2016   Procedure: TRANSESOPHAGEAL ECHOCARDIOGRAM (TEE);  Surgeon: Sanda Klein, MD;  Location: [facility] ENDOSCOPY;  Service: Cardiovascular;  Laterality: N/A;  . THYROIDECTOMY      Family History  Problem Relation Age of Onset  . Pancreatic cancer Mother   . Heart disease Father   . Heart disease Sister   . Heart attack Sister   . Heart disease Brother   .  Diabetes Brother   . Heart disease Brother   . Diabetes Brother   . Hypertension Brother   . Lung cancer Brother     Social History   Socioeconomic History  . Marital status: Widowed    Spouse name: Not on file  . Number of children: 0  . Years of education: Not on file  . Highest education level: Not on file  Occupational History  . Occupation: retired    Fish farm manager: RETIRED  Tobacco Use  . Smoking status: Former Smoker    Packs/day: 0.75    Years: 40.00    Pack years: 30.00    Types: Cigarettes    Quit date: 03/08/1987    Years since quitting: 33.7  . Smokeless tobacco: Never Used  Vaping Use  . Vaping Use: Never used  Substance and Sexual Activity  . Alcohol use: No    Alcohol/week: 0.0 standard drinks  . Drug use: No  . Sexual activity: Not Currently  Other Topics Concern  . Not on file  Social History Narrative   From [place] (2004)   Social Determinants of Health   Financial Resource Strain: Low Risk   . Difficulty of Paying Living Expenses: Not hard at all  Food Insecurity: No Food Insecurity  . Worried About Charity fundraiser in the Last Year: Never true  . Ran Out of Food in the Last Year: Never true  Transportation Needs: No Transportation Needs  . Lack of  Transportation (Medical): No  . Lack of Transportation (Non-Medical): No  Physical Activity: Inactive  . Days of Exercise per Week: 0 days  . Minutes of Exercise per Session: 0 min  Stress: No Stress Concern Present  . Feeling of Stress : Only a little  Social Connections: Moderately Isolated  . Frequency of Communication with Friends and Family: More than three times a week  . Frequency of Social Gatherings with Friends and Family: Twice a week  . Attends Religious Services: More than 4 times per year  . Active Member of Clubs or Organizations: No  . Attends Archivist Meetings: Never  . Marital Status: Widowed  Intimate Partner Violence: Not on file    Outpatient Medications Prior to Visit  Medication Sig Dispense Refill  . acetaminophen (TYLENOL) 500 MG tablet Take 1 tablet (500 mg total) by mouth every 6 (six) hours as needed (pain). 30 tablet 0  . albuterol (PROVENTIL) (2.5 MG/3ML) 0.083% nebulizer solution Take 3 mLs (2.5 mg total) by nebulization in the morning and at bedtime. *May use additional one time if needed for shortness of breath 175 mL 0  . albuterol (VENTOLIN HFA) 108 (90 Base) MCG/ACT inhaler Inhale 1 puff into the lungs every 6 (six) hours as needed for wheezing or shortness of breath. 6.7 g 0  . allopurinol (ZYLOPRIM) 100 MG tablet TAKE ONE TABLET BY MOUTH ONCE DAILY. 30 tablet 0  . benzonatate (TESSALON) 100 MG capsule Take 1 capsule (100 mg total) by mouth 2 (two) times daily as needed for cough. 20 capsule 0  . BREO ELLIPTA 100-25 MCG/INH AEPB INHALE (1) PUFF BY MOUTH INTO THE LUNGS DAILY 60 each 0  . cholecalciferol (VITAMIN D3) 25 MCG (1000 UNIT) tablet Take 1 tablet (1,000 Units total) by mouth in the morning.    . fluticasone (FLONASE) 50 MCG/ACT nasal spray Place 2 sprays into both nostrils daily. 48 g 1  . folic acid (FOLVITE) 1 MG tablet TAKE ONE TABLET BY MOUTH ONCE DAILY. 30 tablet 0  .  gabapentin (NEURONTIN) 300 MG capsule TAKE (1) CAPSULE BY  MOUTH TWICE DAILY 60 capsule 0  . hydrALAZINE (APRESOLINE) 25 MG tablet Take 25 mg by mouth 3 (three) times daily.    Marland Kitchen HYDROcodone-homatropine (HYCODAN) 5-1.5 MG/5ML syrup Take 5 mLs by mouth every 8 (eight) hours as needed for cough. 120 mL 0  . levETIRAcetam (KEPPRA) 500 MG tablet TAKE (1) TABLET BY MOUTH TWICE DAILY AT 9AM AND 9PM 60 tablet 0  . levofloxacin (LEVAQUIN) 500 MG tablet Take 1 tablet (500 mg total) by mouth daily. 5 tablet 0  . Liniments (SALONPAS PAIN RELIEF PATCH EX) Apply 1 patch topically daily as needed (pain).     . metoprolol tartrate (LOPRESSOR) 50 MG tablet TAKE (1) TABLET BY MOUTH TWICE DAILY AT 9AM AND 9PM. 60 tablet 0  . nitroGLYCERIN (NITROSTAT) 0.4 MG SL tablet DISSOLVE 1 TABLET SUBLINGUALLY AS NEEDED FOR CHEST PAIN, MAY REPEAT EVERY 5 MINUTES. AFTER 3 CALL 911. 30 tablet 0  . NON FORMULARY Diet Change: Soft diet with dys 2 (ground) meats, thin liquids (NAS, cons CHO, heart healthy)    . ondansetron (ZOFRAN) 4 MG tablet Take by mouth at bedtime.    . ondansetron (ZOFRAN-ODT) 4 MG disintegrating tablet TAKE (1) TABLET BY MOUTH EVERY EIGHT HOURS AS NEEDED FOR NAUSEA OR VOMITING 10 tablet 0  . OXYGEN Inhale 3 L into the lungs continuous.    [name] [name] (SYSTANE) 0.4-0.3 % SOLN Apply 1 drop to eye at bedtime. Left eye    . polyethylene glycol (MIRALAX / GLYCOLAX) 17 g packet Take 17 g by mouth daily as needed. 14 each 0  . pravastatin (PRAVACHOL) 40 MG tablet TAKE ONE TABLET BY MOUTH ONCE DAILY. 30 tablet 0  . senna (SENOKOT) 8.6 MG TABS tablet Take 2 tablets (17.2 mg total) by mouth daily as needed for mild constipation. 120 tablet 0  . SYNTHROID 88 MCG tablet TAKE 1 TABLET BEFORE BREAKFAST. 30 tablet 0  . UNABLE TO FIND Compression Stockings R-  Ankle  26.5cm   Calf 38cm  Leg  44cm L-  Ankle  27.5 cm   Calf 39cm  Leg  44cm (Patient taking differently: Compression Stockings R-  Ankle  26.5cm   Calf 38cm  Leg  44cm L-  Ankle  27.5 cm   Calf 39cm   Leg  44cm) 1 each 0  . UNABLE TO FIND 15-19 mmhg compression hose, knee high 1 Package 0  . warfarin (COUMADIN) 10 MG tablet TAKE (1) TABLET BY MOUTH DAILY AT 4PM 30 tablet 0  . furosemide (LASIX) 40 MG tablet Take 1 tablet (40 mg total) by mouth 2 (two) times daily. 60 tablet 3  . potassium chloride SA (KLOR-CON) 20 MEQ tablet TAKE ONE TABLET BY MOUTH ONCE DAILY. 30 tablet 0  . predniSONE (DELTASONE) 20 MG tablet Take 1 tablet (20 mg total) by mouth 2 (two) times daily. 10 tablet 0   Facility-Administered Medications Prior to Visit  Medication Dose Route Frequency Provider Last Rate Last Admin  . epoetin alfa (EPOGEN,PROCRIT) injection 40,000 Units  40,000 Units Subcutaneous Once Farrel Gobble, MD        Allergies  Allergen Reactions  . Penicillins Other (See Comments)    Bruise    . Aspirin Other (See Comments)    On coumadin  . Fentanyl Dermatitis    Rash with patch   . Niacin Itching    ROS Review of Systems  Constitutional: Positive for fatigue. Negative for chills and  fever.  Respiratory:       Dyspnea with exertion, wears O2 continuously 2-3L  Cardiovascular: Positive for leg swelling.       Improved from previous  Musculoskeletal:       Uses wheelchair for ambulation  Psychiatric/Behavioral: Negative.       Objective:    Physical Exam Constitutional:      General: She is not in acute distress.    Appearance: She is obese. She is ill-appearing.  Cardiovascular:     Rate and Rhythm: Normal rate and regular rhythm.     Pulses: Normal pulses.     Comments: 2+ pitting edema bilaterally Pulmonary:     Effort: Pulmonary effort is normal.     Breath sounds: Normal breath sounds.  Neurological:     Mental Status: She is alert.  Psychiatric:        Mood and Affect: Mood normal.        Behavior: Behavior normal.        Thought Content: Thought content normal.        Judgment: Judgment normal.     BP 138/69   Pulse 67   Temp 98.3 F (36.8 C)   Resp 16    Ht 5\' 7"  (1.702 m)   Wt 190 lb (86.2 kg)   SpO2 (!) 85%   BMI 29.76 kg/m  Wt Readings from Last 3 Encounters:  11/11/20 190 lb (86.2 kg)  10/27/20 196 lb 6.4 oz (89.1 kg)  09/28/20 190 lb (86.2 kg)     Health Maintenance Due  Topic Date Due  . FOOT EXAM  07/18/2019  . COVID-19 Vaccine (3 - Booster for Moderna series) 07/07/2020  . HEMOGLOBIN A1C  09/22/2020    There are no preventive care reminders to display for this patient.  Lab Results  Component Value Date   TSH 2.32 02/16/2020   Lab Results  Component Value Date   WBC 6.0 10/26/2020   HGB 9.4 (L) 10/26/2020   HCT 31.7 (L) 10/26/2020   MCV 94.1 10/26/2020   PLT 218 10/26/2020   Lab Results  Component Value Date   NA 136 10/21/2020   K 4.1 10/21/2020   CO2 31 10/21/2020   GLUCOSE 103 (H) 10/21/2020   BUN 56 (H) 10/21/2020   CREATININE 1.70 (H) 10/21/2020   BILITOT 0.6 09/28/2020   ALKPHOS 53 09/28/2020   AST 25 09/28/2020   ALT 19 09/28/2020   PROT 7.4 09/28/2020   ALBUMIN 3.0 (L) 10/21/2020   CALCIUM 8.7 (L) 10/21/2020   ANIONGAP 7 10/21/2020   Lab Results  Component Value Date   CHOL 124 01/05/2020   Lab Results  Component Value Date   HDL 44 (L) 01/05/2020   Lab Results  Component Value Date   LDLCALC 65 01/05/2020   Lab Results  Component Value Date   TRIG 73 01/05/2020   Lab Results  Component Value Date   CHOLHDL 2.8 01/05/2020   Lab Results  Component Value Date   HGBA1C 5.8 (A) 03/23/2020   HGBA1C 5.8 03/23/2020   HGBA1C 5.8 03/23/2020   HGBA1C 5.8 03/23/2020      Assessment & Plan:   Problem List Items Addressed This Visit      Cardiovascular and Mediastinum   Essential hypertension (Chronic)    -well controlled today -no change to medication      Relevant Medications   furosemide (LASIX) 40 MG tablet   Chronic systolic congestive heart failure (HCC)    -Her nephrologist recently  increased her furosemide to twice daily due to edema.  This has led to  improvement.  She denies orthopnea or PND but usually sleeps in a recliner.  She is currently on furosemide 80 mg total daily and metolazone 2.5 mg daily. -Has prosthetic heart valves in place -followed by Cardiology- [facility] -refilled lasix per her request as well as potassium      Relevant Medications   furosemide (LASIX) 40 MG tablet     Respiratory   COPD (chronic obstructive pulmonary disease) ([place])    -wears 3L via nasal cannula -no issues with breathing today -use pulse ox at home; she has been trying to decrease her oxygen use, and we discussed her O2 sat should remain above 88%          Meds ordered this encounter  Medications  . furosemide (LASIX) 40 MG tablet    Sig: Take 1 tablet (40 mg total) by mouth 2 (two) times daily.    Dispense:  60 tablet    Refill:  3  . potassium chloride SA (KLOR-CON) 20 MEQ tablet    Sig: Take 1 tablet (20 mEq total) by mouth daily.    Dispense:  30 tablet    Refill:  0    Follow-up: Return in about 3 months (around 02/08/2021) for Lab follow-up.    Noreene Larsson, NP

## 2020-11-11 NOTE — Assessment & Plan Note (Addendum)
-  Her nephrologist recently increased her furosemide to twice daily due to edema.  This has led to improvement.  She denies orthopnea or PND but usually sleeps in a recliner.  She is currently on furosemide 80 mg total daily and metolazone 2.5 mg daily. -Has prosthetic heart valves in place -followed by Cardiology- [facility] -refilled lasix per her request as well as potassium

## 2020-11-11 NOTE — Assessment & Plan Note (Signed)
-  wears 3L via nasal cannula -no issues with breathing today -use pulse ox at home; she has been trying to decrease her oxygen use, and we discussed her O2 sat should remain above 88%

## 2020-11-12 ENCOUNTER — Ambulatory Visit (INDEPENDENT_AMBULATORY_CARE_PROVIDER_SITE_OTHER): Payer: Medicare Other

## 2020-11-12 DIAGNOSIS — J9621 Acute and chronic respiratory failure with hypoxia: Secondary | ICD-10-CM | POA: Diagnosis not present

## 2020-11-12 DIAGNOSIS — I130 Hypertensive heart and chronic kidney disease with heart failure and stage 1 through stage 4 chronic kidney disease, or unspecified chronic kidney disease: Secondary | ICD-10-CM | POA: Diagnosis not present

## 2020-11-12 DIAGNOSIS — E11649 Type 2 diabetes mellitus with hypoglycemia without coma: Secondary | ICD-10-CM | POA: Diagnosis not present

## 2020-11-12 DIAGNOSIS — N179 Acute kidney failure, unspecified: Secondary | ICD-10-CM | POA: Diagnosis not present

## 2020-11-12 DIAGNOSIS — E890 Postprocedural hypothyroidism: Secondary | ICD-10-CM | POA: Diagnosis not present

## 2020-11-12 DIAGNOSIS — D509 Iron deficiency anemia, unspecified: Secondary | ICD-10-CM | POA: Diagnosis not present

## 2020-11-12 DIAGNOSIS — I429 Cardiomyopathy, unspecified: Secondary | ICD-10-CM | POA: Diagnosis not present

## 2020-11-12 DIAGNOSIS — I442 Atrioventricular block, complete: Secondary | ICD-10-CM

## 2020-11-12 DIAGNOSIS — K219 Gastro-esophageal reflux disease without esophagitis: Secondary | ICD-10-CM | POA: Diagnosis not present

## 2020-11-12 DIAGNOSIS — I4819 Other persistent atrial fibrillation: Secondary | ICD-10-CM | POA: Diagnosis not present

## 2020-11-12 DIAGNOSIS — M47816 Spondylosis without myelopathy or radiculopathy, lumbar region: Secondary | ICD-10-CM | POA: Diagnosis not present

## 2020-11-12 DIAGNOSIS — I5043 Acute on chronic combined systolic (congestive) and diastolic (congestive) heart failure: Secondary | ICD-10-CM | POA: Diagnosis not present

## 2020-11-12 DIAGNOSIS — M48061 Spinal stenosis, lumbar region without neurogenic claudication: Secondary | ICD-10-CM | POA: Diagnosis not present

## 2020-11-12 DIAGNOSIS — H409 Unspecified glaucoma: Secondary | ICD-10-CM | POA: Diagnosis not present

## 2020-11-12 DIAGNOSIS — J449 Chronic obstructive pulmonary disease, unspecified: Secondary | ICD-10-CM | POA: Diagnosis not present

## 2020-11-12 DIAGNOSIS — R1310 Dysphagia, unspecified: Secondary | ICD-10-CM | POA: Diagnosis not present

## 2020-11-12 DIAGNOSIS — E669 Obesity, unspecified: Secondary | ICD-10-CM | POA: Diagnosis not present

## 2020-11-12 DIAGNOSIS — K5900 Constipation, unspecified: Secondary | ICD-10-CM | POA: Diagnosis not present

## 2020-11-12 DIAGNOSIS — R569 Unspecified convulsions: Secondary | ICD-10-CM | POA: Diagnosis not present

## 2020-11-12 DIAGNOSIS — I950 Idiopathic hypotension: Secondary | ICD-10-CM | POA: Diagnosis not present

## 2020-11-12 DIAGNOSIS — M4310 Spondylolisthesis, site unspecified: Secondary | ICD-10-CM | POA: Diagnosis not present

## 2020-11-12 DIAGNOSIS — E1122 Type 2 diabetes mellitus with diabetic chronic kidney disease: Secondary | ICD-10-CM | POA: Diagnosis not present

## 2020-11-12 DIAGNOSIS — G4700 Insomnia, unspecified: Secondary | ICD-10-CM | POA: Diagnosis not present

## 2020-11-12 DIAGNOSIS — I8393 Asymptomatic varicose veins of bilateral lower extremities: Secondary | ICD-10-CM | POA: Diagnosis not present

## 2020-11-12 DIAGNOSIS — N184 Chronic kidney disease, stage 4 (severe): Secondary | ICD-10-CM | POA: Diagnosis not present

## 2020-11-12 LAB — CUP PACEART REMOTE DEVICE CHECK
Battery Remaining Longevity: 51 mo
Battery Voltage: 2.96 V
Brady Statistic AP VP Percent: 0 %
Brady Statistic AP VS Percent: 0 %
Brady Statistic AS VP Percent: 99.87 %
Brady Statistic AS VS Percent: 0.13 %
Brady Statistic RA Percent Paced: 0 %
Brady Statistic RV Percent Paced: 99.87 %
Date Time Interrogation Session: 20220218055249
Implantable Lead Implant Date: 20040521
Implantable Lead Implant Date: 20071218
Implantable Lead Implant Date: 20160516
Implantable Lead Location: 753858
Implantable Lead Location: 753859
Implantable Lead Location: 753860
Implantable Lead Model: 5076
Implantable Lead Model: 6947
Implantable Pulse Generator Implant Date: 20200506
Lead Channel Impedance Value: 285 Ohm
Lead Channel Impedance Value: 285 Ohm
Lead Channel Impedance Value: 323 Ohm
Lead Channel Impedance Value: 361 Ohm
Lead Channel Impedance Value: 361 Ohm
Lead Channel Impedance Value: 399 Ohm
Lead Channel Impedance Value: 399 Ohm
Lead Channel Impedance Value: 399 Ohm
Lead Channel Impedance Value: 665 Ohm
Lead Channel Impedance Value: 684 Ohm
Lead Channel Impedance Value: 684 Ohm
Lead Channel Impedance Value: 703 Ohm
Lead Channel Impedance Value: 722 Ohm
Lead Channel Impedance Value: 722 Ohm
Lead Channel Pacing Threshold Amplitude: 1.5 V
Lead Channel Pacing Threshold Amplitude: 2.75 V
Lead Channel Pacing Threshold Pulse Width: 0.4 ms
Lead Channel Pacing Threshold Pulse Width: 1 ms
Lead Channel Sensing Intrinsic Amplitude: 8.875 mV
Lead Channel Sensing Intrinsic Amplitude: 8.875 mV
Lead Channel Setting Pacing Amplitude: 2.5 V
Lead Channel Setting Pacing Amplitude: 3 V
Lead Channel Setting Pacing Pulse Width: 0.8 ms
Lead Channel Setting Pacing Pulse Width: 1 ms
Lead Channel Setting Sensing Sensitivity: 4 mV

## 2020-11-16 ENCOUNTER — Ambulatory Visit (INDEPENDENT_AMBULATORY_CARE_PROVIDER_SITE_OTHER): Payer: Medicare Other | Admitting: *Deleted

## 2020-11-16 DIAGNOSIS — Z952 Presence of prosthetic heart valve: Secondary | ICD-10-CM | POA: Diagnosis not present

## 2020-11-16 DIAGNOSIS — N179 Acute kidney failure, unspecified: Secondary | ICD-10-CM | POA: Diagnosis not present

## 2020-11-16 DIAGNOSIS — I4819 Other persistent atrial fibrillation: Secondary | ICD-10-CM | POA: Diagnosis not present

## 2020-11-16 DIAGNOSIS — I5043 Acute on chronic combined systolic (congestive) and diastolic (congestive) heart failure: Secondary | ICD-10-CM | POA: Diagnosis not present

## 2020-11-16 DIAGNOSIS — Z5181 Encounter for therapeutic drug level monitoring: Secondary | ICD-10-CM | POA: Diagnosis not present

## 2020-11-16 DIAGNOSIS — E1122 Type 2 diabetes mellitus with diabetic chronic kidney disease: Secondary | ICD-10-CM | POA: Diagnosis not present

## 2020-11-16 DIAGNOSIS — I130 Hypertensive heart and chronic kidney disease with heart failure and stage 1 through stage 4 chronic kidney disease, or unspecified chronic kidney disease: Secondary | ICD-10-CM | POA: Diagnosis not present

## 2020-11-16 DIAGNOSIS — N184 Chronic kidney disease, stage 4 (severe): Secondary | ICD-10-CM | POA: Diagnosis not present

## 2020-11-16 LAB — POCT INR: INR: 1.8 — AB (ref 2.0–3.0)

## 2020-11-16 NOTE — Progress Notes (Signed)
Remote pacemaker transmission.   

## 2020-11-16 NOTE — Patient Instructions (Signed)
Spoke with [facility]. Take warfarin 1 1/2 tablets (15mg ) tonight then increase dose to 1 tablet  (10mg ) daily except for 1/2 tablet (5mg )  on Monday and Friday. [place] will recheck INR on 11/23/20

## 2020-11-22 ENCOUNTER — Ambulatory Visit (INDEPENDENT_AMBULATORY_CARE_PROVIDER_SITE_OTHER): Payer: Medicare Other

## 2020-11-22 DIAGNOSIS — Z950 Presence of cardiac pacemaker: Secondary | ICD-10-CM

## 2020-11-22 DIAGNOSIS — I5042 Chronic combined systolic (congestive) and diastolic (congestive) heart failure: Secondary | ICD-10-CM | POA: Diagnosis not present

## 2020-11-23 ENCOUNTER — Other Ambulatory Visit (HOSPITAL_COMMUNITY)
Admission: RE | Admit: 2020-11-23 | Discharge: 2020-11-23 | Disposition: A | Discharge status: Home / Self Care | Payer: Medicare Other | Source: Ambulatory Visit | Attending: Nephrology | Admitting: Nephrology

## 2020-11-23 ENCOUNTER — Ambulatory Visit (HOSPITAL_COMMUNITY): Payer: Medicare Other

## 2020-11-23 ENCOUNTER — Other Ambulatory Visit: Payer: Self-pay | Admitting: Family Medicine

## 2020-11-23 ENCOUNTER — Ambulatory Visit (INDEPENDENT_AMBULATORY_CARE_PROVIDER_SITE_OTHER): Payer: Medicare Other | Admitting: *Deleted

## 2020-11-23 ENCOUNTER — Other Ambulatory Visit: Payer: Self-pay

## 2020-11-23 ENCOUNTER — Encounter (HOSPITAL_COMMUNITY): Payer: Self-pay

## 2020-11-23 ENCOUNTER — Inpatient Hospital Stay (HOSPITAL_COMMUNITY): Payer: Medicare Other

## 2020-11-23 ENCOUNTER — Inpatient Hospital Stay (HOSPITAL_COMMUNITY): Payer: Medicare Other | Attending: Hematology

## 2020-11-23 ENCOUNTER — Other Ambulatory Visit (HOSPITAL_COMMUNITY): Payer: Medicare Other

## 2020-11-23 VITALS — BP 124/60 | HR 60 | Temp 97.0°F | Resp 16

## 2020-11-23 DIAGNOSIS — E611 Iron deficiency: Secondary | ICD-10-CM

## 2020-11-23 DIAGNOSIS — N179 Acute kidney failure, unspecified: Secondary | ICD-10-CM | POA: Insufficient documentation

## 2020-11-23 DIAGNOSIS — D649 Anemia, unspecified: Secondary | ICD-10-CM

## 2020-11-23 DIAGNOSIS — N183 Chronic kidney disease, stage 3 unspecified: Secondary | ICD-10-CM | POA: Insufficient documentation

## 2020-11-23 DIAGNOSIS — N189 Chronic kidney disease, unspecified: Secondary | ICD-10-CM | POA: Insufficient documentation

## 2020-11-23 DIAGNOSIS — N1832 Chronic kidney disease, stage 3b: Secondary | ICD-10-CM | POA: Insufficient documentation

## 2020-11-23 DIAGNOSIS — Z952 Presence of prosthetic heart valve: Secondary | ICD-10-CM

## 2020-11-23 DIAGNOSIS — N184 Chronic kidney disease, stage 4 (severe): Secondary | ICD-10-CM | POA: Diagnosis not present

## 2020-11-23 DIAGNOSIS — I129 Hypertensive chronic kidney disease with stage 1 through stage 4 chronic kidney disease, or unspecified chronic kidney disease: Secondary | ICD-10-CM | POA: Insufficient documentation

## 2020-11-23 DIAGNOSIS — D631 Anemia in chronic kidney disease: Secondary | ICD-10-CM | POA: Insufficient documentation

## 2020-11-23 DIAGNOSIS — I5043 Acute on chronic combined systolic (congestive) and diastolic (congestive) heart failure: Secondary | ICD-10-CM | POA: Insufficient documentation

## 2020-11-23 DIAGNOSIS — D630 Anemia in neoplastic disease: Secondary | ICD-10-CM | POA: Insufficient documentation

## 2020-11-23 DIAGNOSIS — Z5181 Encounter for therapeutic drug level monitoring: Secondary | ICD-10-CM

## 2020-11-23 DIAGNOSIS — E1122 Type 2 diabetes mellitus with diabetic chronic kidney disease: Secondary | ICD-10-CM | POA: Diagnosis not present

## 2020-11-23 DIAGNOSIS — I4819 Other persistent atrial fibrillation: Secondary | ICD-10-CM | POA: Diagnosis not present

## 2020-11-23 DIAGNOSIS — I130 Hypertensive heart and chronic kidney disease with heart failure and stage 1 through stage 4 chronic kidney disease, or unspecified chronic kidney disease: Secondary | ICD-10-CM | POA: Diagnosis not present

## 2020-11-23 LAB — CBC WITH DIFFERENTIAL/PLATELET
Abs Immature Granulocytes: 0.02 10*3/uL (ref 0.00–0.07)
Basophils Absolute: 0 10*3/uL (ref 0.0–0.1)
Basophils Relative: 0 %
Eosinophils Absolute: 0 10*3/uL (ref 0.0–0.5)
Eosinophils Relative: 0 %
HCT: 31.1 % — ABNORMAL LOW (ref 36.0–46.0)
Hemoglobin: 9.3 g/dL — ABNORMAL LOW (ref 12.0–15.0)
Immature Granulocytes: 0 %
Lymphocytes Relative: 13 %
Lymphs Abs: 0.7 10*3/uL (ref 0.7–4.0)
MCH: 27.4 pg (ref 26.0–34.0)
MCHC: 29.9 g/dL — ABNORMAL LOW (ref 30.0–36.0)
MCV: 91.7 fL (ref 80.0–100.0)
Monocytes Absolute: 0.5 10*3/uL (ref 0.1–1.0)
Monocytes Relative: 9 %
Neutro Abs: 4 10*3/uL (ref 1.7–7.7)
Neutrophils Relative %: 78 %
Platelets: 204 10*3/uL (ref 150–400)
RBC: 3.39 MIL/uL — ABNORMAL LOW (ref 3.87–5.11)
RDW: 17.2 % — ABNORMAL HIGH (ref 11.5–15.5)
WBC: 5.2 10*3/uL (ref 4.0–10.5)
nRBC: 0 % (ref 0.0–0.2)

## 2020-11-23 LAB — RENAL FUNCTION PANEL
Albumin: 3.5 g/dL (ref 3.5–5.0)
Anion gap: 10 (ref 5–15)
BUN: 90 mg/dL — ABNORMAL HIGH (ref 8–23)
CO2: 32 mmol/L (ref 22–32)
Calcium: 8.8 mg/dL — ABNORMAL LOW (ref 8.9–10.3)
Chloride: 97 mmol/L — ABNORMAL LOW (ref 98–111)
Creatinine, Ser: 2.08 mg/dL — ABNORMAL HIGH (ref 0.44–1.00)
GFR, Estimated: 23 mL/min — ABNORMAL LOW (ref >60)
Glucose, Bld: 108 mg/dL — ABNORMAL HIGH (ref 70–99)
Phosphorus: 4.3 mg/dL (ref 2.5–4.6)
Potassium: 4.7 mmol/L (ref 3.5–5.1)
Sodium: 139 mmol/L (ref 135–145)

## 2020-11-23 LAB — CBC
HCT: 30.6 % — ABNORMAL LOW (ref 36.0–46.0)
Hemoglobin: 9.3 g/dL — ABNORMAL LOW (ref 12.0–15.0)
MCH: 27.9 pg (ref 26.0–34.0)
MCHC: 30.4 g/dL (ref 30.0–36.0)
MCV: 91.9 fL (ref 80.0–100.0)
Platelets: 211 10*3/uL (ref 150–400)
RBC: 3.33 MIL/uL — ABNORMAL LOW (ref 3.87–5.11)
RDW: 17.4 % — ABNORMAL HIGH (ref 11.5–15.5)
WBC: 5.1 10*3/uL (ref 4.0–10.5)
nRBC: 0 % (ref 0.0–0.2)

## 2020-11-23 LAB — POCT INR: INR: 1.9 — AB (ref 2.0–3.0)

## 2020-11-23 MED ORDER — EPOETIN ALFA-EPBX 40000 UNIT/ML IJ SOLN
40000.0000 [IU] | Freq: Once | INTRAMUSCULAR | Status: AC
  Administered 2020-11-23: 40000 [IU] via SUBCUTANEOUS
  Filled 2020-11-23: qty 1

## 2020-11-23 NOTE — Progress Notes (Signed)
Patient presents today for Retacrit injection. Hemoglobin reviewed prior to administration. VSS. Injection tolerated without incident or complaint. See MAR for details. Patient discharged in satisfactory condition with follow up instructions. 

## 2020-11-23 NOTE — Patient Instructions (Signed)
Spoke with [facility]. Take warfarin 1 1/2 tablets (15mg ) tonight then resume 1 tablet  (10mg ) daily except for 1/2 tablet (5mg )  on Monday and Friday. [facility] will recheck INR on 11/30/20

## 2020-11-24 ENCOUNTER — Ambulatory Visit: Payer: Medicare Other | Admitting: Family Medicine

## 2020-11-24 ENCOUNTER — Telehealth: Payer: Self-pay

## 2020-11-24 NOTE — Telephone Encounter (Signed)
Verbal order given  

## 2020-11-24 NOTE — Progress Notes (Signed)
EPIC Encounter for ICM Monitoring  Patient Name: [name] [name] is a 87 y.o. female Date: 11/24/2020 Primary Care Physican: Fayrene Helper, MD Primary Cardiologist:[facility] Electrophysiologist:Taylor Bi-V Pacing:99.8% 11/24/2020 Weight: 196lbs     Spoke with patient and reports little ankle swelling at this time but otherwise feeling fine.    Optivol thoracic impedancetrending close to baseline normal.  Prescribed:  Furosemide40 mg1tablet (40 mg total)by mouth twice a day.   Potassium 20 mEq take 1 tablet daily.  Labs: 11/23/2020 Creatinine 2.08, BUN 90, Potassium 4.7, Sodium 139, GFR 23 10/21/2020 Creatinine 1.70, BUN 56, Potassium 4.1, Sodium 136, GFR 29  10/07/2020 Creatinine 1.87, BUN 67, Potassium 4.5, Sodium 138, GFR 26  09/28/2020 Creatinine 1.65, BUN 31, Potassium 4.1, Sodium 137, GFR 30  09/23/2020 Creatinine 1.51, BUN 54, Potassium 5.8, Sodium 141, GFR 33  09/10/2020 Creatinine 1.46, BUN 51, Potassium 5.4, Sodium 140, GFR 35  A complete set of results can be found in Results Review.  RecommendationsConfirmed she is taking Furosemide prescribed dosage.  Advise to limit salt intake and call if she has any changes.   Follow-up plan: ICM clinic phone appointment on4/12/2020.91 day device clinic remote transmission5/20/2022.  EP/Cardiology Office Visits:01/11/2021 with Dr Lovena Le.  5/2/2022with Dr Sallyanne Kuster.   Copy of ICM check sent to Dr.Taylor.  3 month ICM trend: 11/22/2020.    1 Year ICM trend:       [name], RN 11/24/2020 1:30 PM

## 2020-11-24 NOTE — Telephone Encounter (Signed)
[name] [name] from [facility] called needs a verbal order for Palliative Care not hospice just Palliative Care only. [place] contact # [phone].

## 2020-11-25 ENCOUNTER — Ambulatory Visit (INDEPENDENT_AMBULATORY_CARE_PROVIDER_SITE_OTHER): Payer: Medicare Other | Admitting: *Deleted

## 2020-11-25 DIAGNOSIS — I5022 Chronic systolic (congestive) heart failure: Secondary | ICD-10-CM

## 2020-11-25 DIAGNOSIS — E1122 Type 2 diabetes mellitus with diabetic chronic kidney disease: Secondary | ICD-10-CM | POA: Diagnosis not present

## 2020-11-25 DIAGNOSIS — I129 Hypertensive chronic kidney disease with stage 1 through stage 4 chronic kidney disease, or unspecified chronic kidney disease: Secondary | ICD-10-CM | POA: Diagnosis not present

## 2020-11-25 DIAGNOSIS — N184 Chronic kidney disease, stage 4 (severe): Secondary | ICD-10-CM | POA: Diagnosis not present

## 2020-11-25 DIAGNOSIS — N189 Chronic kidney disease, unspecified: Secondary | ICD-10-CM | POA: Diagnosis not present

## 2020-11-25 DIAGNOSIS — N179 Acute kidney failure, unspecified: Secondary | ICD-10-CM | POA: Diagnosis not present

## 2020-11-25 DIAGNOSIS — I4819 Other persistent atrial fibrillation: Secondary | ICD-10-CM | POA: Diagnosis not present

## 2020-11-25 DIAGNOSIS — D631 Anemia in chronic kidney disease: Secondary | ICD-10-CM | POA: Diagnosis not present

## 2020-11-25 DIAGNOSIS — I5043 Acute on chronic combined systolic (congestive) and diastolic (congestive) heart failure: Secondary | ICD-10-CM | POA: Diagnosis not present

## 2020-11-25 DIAGNOSIS — I130 Hypertensive heart and chronic kidney disease with heart failure and stage 1 through stage 4 chronic kidney disease, or unspecified chronic kidney disease: Secondary | ICD-10-CM | POA: Diagnosis not present

## 2020-11-25 DIAGNOSIS — I10 Essential (primary) hypertension: Secondary | ICD-10-CM | POA: Diagnosis not present

## 2020-11-25 NOTE — Chronic Care Management (AMB) (Signed)
Chronic Care Management   CCM RN Visit Note  11/25/2020 Name: [name] [name] MRN: [id number] DOB: 1932/05/26  Subjective: [name] [name] is a 87 y.o. year old female who is a primary care patient of Fayrene Helper, MD. The care management team was consulted for assistance with disease management and care coordination needs.    Engaged with patient by telephone for follow up visit in response to provider referral for case management and/or care coordination services.   Consent to Services:  The patient was given information about Chronic Care Management services, agreed to services, and gave verbal consent prior to initiation of services.  Please see initial visit note for detailed documentation.   Patient agreed to services and verbal consent obtained.   Assessment: Review of patient past medical history, allergies, medications, health status, including review of consultants reports, laboratory and other test data, was performed as part of comprehensive evaluation and provision of chronic care management services.   SDOH (Social Determinants of Health) assessments and interventions performed:    CCM Care Plan  Allergies  Allergen Reactions  . Penicillins Other (See Comments)    Bruise    . Aspirin Other (See Comments)    On coumadin  . Fentanyl Dermatitis    Rash with patch   . Niacin Itching    Outpatient Encounter Medications as of 11/25/2020  Medication Sig Note  . acetaminophen (TYLENOL) 500 MG tablet Take 1 tablet (500 mg total) by mouth every 6 (six) hours as needed (pain).   Marland Kitchen albuterol (PROVENTIL) (2.5 MG/3ML) 0.083% nebulizer solution Take 3 mLs (2.5 mg total) by nebulization in the morning and at bedtime. *May use additional one time if needed for shortness of breath   . albuterol (VENTOLIN HFA) 108 (90 Base) MCG/ACT inhaler Inhale 1 puff into the lungs every 6 (six) hours as needed for wheezing or shortness of breath.   . allopurinol (ZYLOPRIM) 100 MG tablet TAKE  ONE TABLET BY MOUTH ONCE DAILY.   . benzonatate (TESSALON) 100 MG capsule Take 1 capsule (100 mg total) by mouth 2 (two) times daily as needed for cough. 09/22/2020: Reports she has confirmed that her outpatient pharmacy will deliver to her today  . BREO ELLIPTA 100-25 MCG/INH AEPB INHALE (1) PUFF BY MOUTH INTO THE LUNGS DAILY   . cholecalciferol (VITAMIN D3) 25 MCG (1000 UNIT) tablet Take 1 tablet (1,000 Units total) by mouth in the morning.   . fluticasone (FLONASE) 50 MCG/ACT nasal spray Place 2 sprays into both nostrils daily.   . folic acid (FOLVITE) 1 MG tablet TAKE ONE TABLET BY MOUTH ONCE DAILY.   . furosemide (LASIX) 40 MG tablet Take 1 tablet (40 mg total) by mouth 2 (two) times daily.   Marland Kitchen gabapentin (NEURONTIN) 300 MG capsule TAKE (1) CAPSULE BY MOUTH TWICE DAILY   . hydrALAZINE (APRESOLINE) 25 MG tablet Take 25 mg by mouth 3 (three) times daily.   Marland Kitchen HYDROcodone-homatropine (HYCODAN) 5-1.5 MG/5ML syrup Take 5 mLs by mouth every 8 (eight) hours as needed for cough.   . levETIRAcetam (KEPPRA) 500 MG tablet TAKE (1) TABLET BY MOUTH TWICE DAILY AT 9AM AND 9PM   . levofloxacin (LEVAQUIN) 500 MG tablet Take 1 tablet (500 mg total) by mouth daily. 09/22/2020: Reports that she has confirmed that her outpatient pharmacy will deliver to her today  . Liniments (SALONPAS PAIN RELIEF PATCH EX) Apply 1 patch topically daily as needed (pain).    . metoprolol tartrate (LOPRESSOR) 50 MG tablet TAKE (1) TABLET BY  MOUTH TWICE DAILY AT 9AM AND 9PM.   . nitroGLYCERIN (NITROSTAT) 0.4 MG SL tablet DISSOLVE 1 TABLET SUBLINGUALLY AS NEEDED FOR CHEST PAIN, MAY REPEAT EVERY 5 MINUTES. AFTER 3 CALL 911.   . NON FORMULARY Diet Change: Soft diet with dys 2 (ground) meats, thin liquids (NAS, cons CHO, heart healthy)   . ondansetron (ZOFRAN) 4 MG tablet Take by mouth at bedtime.   . ondansetron (ZOFRAN-ODT) 4 MG disintegrating tablet TAKE (1) TABLET BY MOUTH EVERY EIGHT HOURS AS NEEDED FOR NAUSEA OR VOMITING   . OXYGEN  Inhale 3 L into the lungs continuous.   [name] [name] (SYSTANE) 0.4-0.3 % SOLN Apply 1 drop to eye at bedtime. Left eye   . polyethylene glycol (MIRALAX / GLYCOLAX) 17 g packet Take 17 g by mouth daily as needed.   . potassium chloride SA (KLOR-CON) 20 MEQ tablet Take 1 tablet (20 mEq total) by mouth daily.   . pravastatin (PRAVACHOL) 40 MG tablet TAKE ONE TABLET BY MOUTH ONCE DAILY.   Marland Kitchen senna (SENOKOT) 8.6 MG TABS tablet Take 2 tablets (17.2 mg total) by mouth daily as needed for mild constipation.   Marland Kitchen SYNTHROID 88 MCG tablet TAKE 1 TABLET BEFORE BREAKFAST.   Marland Kitchen UNABLE TO FIND Compression Stockings R-  Ankle  26.5cm   Calf 38cm  Leg  44cm L-  Ankle  27.5 cm   Calf 39cm  Leg  44cm (Patient taking differently: Compression Stockings R-  Ankle  26.5cm   Calf 38cm  Leg  44cm L-  Ankle  27.5 cm   Calf 39cm  Leg  44cm)   . UNABLE TO FIND 15-19 mmhg compression hose, knee high   . warfarin (COUMADIN) 10 MG tablet TAKE (1) TABLET BY MOUTH DAILY AT 4PM    Facility-Administered Encounter Medications as of 11/25/2020  Medication  . epoetin alfa (EPOGEN,PROCRIT) injection 40,000 Units    Patient Active Problem List   Diagnosis Date Noted  . Gait abnormality 08/24/2020  . Chronic constipation 08/09/2020  . Acute UTI 08/03/2020  . Hypertensive heart and kidney disease with chronic systolic congestive heart failure and stage 4 chronic kidney disease ([place]) 07/15/2020  . Hyperlipidemia associated with type 2 diabetes mellitus ([place]) 07/15/2020  . Seizure ([place]) 07/07/2020  . Goals of care, counseling/discussion   . Palliative care by specialist   . Idiopathic hypotension   . CHF exacerbation ([place]) 06/29/2020  . Arthritis of right knee 06/20/2020  . Leg swelling 06/16/2020  . Generalized osteoarthritis 05/09/2020  . Primary osteoarthritis, left shoulder 04/21/2020  . Acute systolic CHF (congestive heart failure) ([place]) 03/31/2020  . Bilateral lower extremity edema   . Acute on  chronic congestive heart failure ([place]) 03/30/2020  . Right-sided epistaxis 03/28/2020  . Acute pain of left shoulder 01/15/2020  . AAA (abdominal aortic aneurysm) without rupture ([place]) 01/14/2020  . COVID-19 virus detected 11/23/2019  . Stage 3b chronic kidney disease ([place]) 08/04/2019  . Dyspnea 05/30/2019  . Anemia 05/30/2019  . Gout   . Glaucoma   . GERD (gastroesophageal reflux disease)   . Physical debility 04/28/2019  . Encounter for examination following treatment at hospital 11/03/2018  . Hyponatremia 09/27/2018  . H/O mitral valve replacement with mechanical valve 08/02/2018  . Chronic right hip pain 04/16/2018  . Chronic systolic congestive heart failure ([place]) 11/15/2017  . COPD with acute exacerbation ([place]) 11/15/2017  . Trochanteric bursitis, right hip 03/07/2017  . Persistent atrial fibrillation ([place])   . Nocturnal hypoxia 10/08/2016  .  Dependence on nocturnal oxygen therapy 10/08/2016  . Hematuria 03/20/2016  . S/P ICD (internal cardiac defibrillator) procedure 02/08/2015  . Presence of cardiac pacemaker 02/08/2015  . CHB (complete heart block) ([place]) 10/07/2014  . Fatigue 07/20/2014  . Absolute anemia 06/24/2014  . Osteopenia 02/10/2014  . Encounter for therapeutic drug monitoring 10/29/2013  . OA (osteoarthritis) of knee 02/19/2013  . Chronic pain of right knee 01/02/2013  . Hemolytic anemia ([place]) 09/24/2012  . High risk medication use 05/24/2012  . COPD (chronic obstructive pulmonary disease) ([place]) 05/19/2012  . Chronic anticoagulation, on coumadin for Mechanical AVR and MVR 04/01/2012  . Cardiorenal syndrome with renal failure 03/22/2012  . Hx of aortic valve replacement, mechanical 03/22/2012  . S/P MVR (mitral valve replacement) 03/22/2012  . Biventricular automatic implantable cardioverter defibrillator in situ 03/22/2012  . CKD (chronic kidney disease), stage IV ([place]) 03/22/2012  . Warfarin-induced coagulopathy ([place]) 03/21/2012  . Iron deficiency  10/04/2011  . Allergic rhinitis 02/14/2011  . Mitral valve disease 07/15/2010  . Sinus node dysfunction ([place]) 07/15/2010  . Type 2 diabetes mellitus with hypoglycemia without coma ([place]) 07/15/2010  . VARICOSE VEINS LOWER EXTREMITIES W/OTH COMPS 02/09/2009  . Prediabetes 06/15/2008  . Hypothyroidism, postsurgical 02/24/2008  . Hyperlipidemia LDL goal <70 02/24/2008  . Essential hypertension 02/24/2008  . Non-ischemic cardiomyopathy with ICD 02/24/2008  . Spondylosis 02/24/2008    Conditions to be addressed/monitored:CHF and HTN  Care Plan : Heart Failure (Adult)  Updates made by Kassie Mends, RN since 11/25/2020 12:00 AM    Problem: Symptom Exacerbation (Heart Failure)     Long-Range Goal: Symptom Exacerbation Prevented or Minimized   Start Date: 10/29/2020  Expected End Date: 04/28/2021  Recent Progress: On track  Priority: High  Note:   Current Barriers:  Marland Kitchen Knowledge deficits related to basic heart failure pathophysiology and self care management as evidenced by patient weighing every other day instead of daily Nurse Case Manager Clinical Goal(s):   Over the next 30 days, patient will weigh self daily and record  Over the next 60 days, patient will verbalize understanding of Heart Failure Action Plan and when to call doctor Interventions:  . Collaboration with Fayrene Helper, MD regarding development and update of comprehensive plan of care as evidenced by provider attestation and co-signature . Inter-disciplinary care team collaboration (see longitudinal plan of care) . Basic overview and discussion of pathophysiology of Heart Failure . Provided verbal education on low sodium diet . Reinforced Heart Failure Action Plan . Discussed importance of daily weight and recording . Reviewed role of diuretics in prevention of fluid overload and importance of taking as prescribed . Reviewed with patient and CNA that patient does have compression hose and is wearing  daily . Reviewed medications with patient and home care aide and encouraged compliance Patient Goals/Self-Care Activities . follow heart failure action plan if symptoms flare up . Continue weighing and recording . know when to call the doctor- call for weight gain of 3 pounds in one day or 5 pounds in one week. . Call [place] for any questions at [phone] . Wear compression hose as directed, take them off at night . Take medications as prescribed  Follow Up Plan: Telephone follow up appointment with care management team member scheduled for: 12/30/2020     Care Plan : Hypertension (Adult)  Updates made by Kassie Mends, RN since 11/25/2020 12:00 AM    Problem: Hypertension (Hypertension)   Priority: Medium    Long-Range Goal: Hypertension Monitored  Start Date: 10/29/2020  Expected End Date: 04/28/2021  Recent Progress: On track  Priority: Medium  Note:   Objective:  . Last practice recorded BP readings:  BP Readings from Last 3 Encounters:  10/27/20 (!) 153/77  10/26/20 (!) 145/75  09/28/20 133/72   . Most recent eGFR/CrCl: No results found for: EGFR  No components found for: CRCL Current Barriers:  Marland Kitchen Knowledge Deficits related to basic understanding of hypertension pathophysiology and self care management as evidenced by continued reports of elevated blood pressures . Unable to perform ADLs independently- has [facility] home care aide that assists with checking blood pressure several times weekly Case Manager Clinical Goal(s):  Marland Kitchen Over the next 60 days, patient will demonstrate improved adherence to prescribed treatment plan for hypertension as evidenced by taking all medications as prescribed, monitoring and recording blood pressure as directed, adhering to low sodium/DASH diet Interventions:  . Collaboration with Fayrene Helper, MD regarding development and update of comprehensive plan of care as evidenced by provider attestation and co-signature . Inter-disciplinary  care team collaboration (see longitudinal plan of care) . Reviewed medications with patient and discussed importance of compliance . Re-educated patient, providing education and rationale, to monitor blood pressure daily and record, calling PCP for findings outside established parameters.  . Reinforced low salt diet and importance of adherence Patient Goals/Self-Care Activities . Continue checking blood pressure 3 times per week with the assistance of [facility] home aide . write blood pressure results in a log or diary . Call primary care doctor for any readings outside of normal limits  . follow a low salt diet- avoid fast food, salty chips, fries, etc. Follow Up Plan: Telephone follow up appointment with care management team member scheduled for: 12/30/2020     Plan:Telephone follow up appointment with care management team member scheduled for:  12/30/2020  Jacqlyn Larsen [facility], BSN RN Case Manager [facility] [phone]

## 2020-11-25 NOTE — Patient Instructions (Signed)
Visit Information  PATIENT GOALS: Goals Addressed            This Visit's Progress   . Track and Manage My Blood Pressure-Hypertension       Timeframe:  Long-Range Goal Priority:  Medium Start Date:      10/29/2020                       Expected End Date:  04/28/2021                     Follow Up Date 12/30/2020   . Continue checking blood pressure 3 times per week with the assistance of [facility] home aide . write blood pressure results in a log or diary . Call primary care doctor for any readings outside of normal limits  . follow a low salt diet- avoid fast food, salty chips, fries, etc.   Why is this important?    You won't feel high blood pressure, but it can still hurt your blood vessels.   High blood pressure can cause heart or kidney problems. It can also cause a stroke.   Making lifestyle changes like losing a little weight or eating less salt will help.   Checking your blood pressure at home and at different times of the day can help to control blood pressure.   If the doctor prescribes medicine remember to take it the way the doctor ordered.   Call the office if you cannot afford the medicine or if there are questions about it.        . Track and Manage Symptoms-Heart Failure   On track    Timeframe:  Long-Range Goal Priority:  High Start Date:       10/29/2020                      Expected End Date:   04/28/2021                    Follow Up Date 11/26/2020   . follow heart failure action plan if symptoms flare up . Continue weighing and recording . know when to call the doctor- call for weight gain of 3 pounds in one day or 5 pounds in one week. . Call [place] for any questions at [phone] . Wear compression hose as directed, take them off at night . Take medications as prescribed   Why is this important?    You will be able to handle your symptoms better if you keep track of them.   Making some simple changes to your lifestyle will help.   Eating  healthy is one thing you can do to take good care of yourself.           The patient verbalized understanding of instructions, educational materials, and care plan provided today and declined offer to receive copy of patient instructions, educational materials, and care plan.   Telephone follow up appointment with care management team member scheduled for:  12/30/2020  Jacqlyn Larsen [facility] BSN [place], [place]

## 2020-11-26 DIAGNOSIS — L851 Acquired keratosis [keratoderma] palmaris et plantaris: Secondary | ICD-10-CM | POA: Diagnosis not present

## 2020-11-26 DIAGNOSIS — E1142 Type 2 diabetes mellitus with diabetic polyneuropathy: Secondary | ICD-10-CM | POA: Diagnosis not present

## 2020-11-26 DIAGNOSIS — L603 Nail dystrophy: Secondary | ICD-10-CM | POA: Diagnosis not present

## 2020-11-29 ENCOUNTER — Telehealth: Payer: Self-pay

## 2020-11-29 NOTE — Telephone Encounter (Signed)
Spoke with patient and scheduled an in-person Palliative Consult for 12/14/20 @ 2PM  COVID screening was negative. No pets in home. Patient's nephew lives with her. Patient has a aide Mon-Fri 12-1PM  Consent obtained; updated Outlook/Netsmart/Team List and Epic.  Family is aware they may be receiving a call from NP the day before or day of to confirm appointment.

## 2020-11-30 ENCOUNTER — Other Ambulatory Visit: Payer: Self-pay

## 2020-11-30 ENCOUNTER — Telehealth: Payer: Self-pay | Admitting: *Deleted

## 2020-11-30 ENCOUNTER — Ambulatory Visit (INDEPENDENT_AMBULATORY_CARE_PROVIDER_SITE_OTHER): Payer: Medicare Other | Admitting: Orthopaedic Surgery

## 2020-11-30 ENCOUNTER — Ambulatory Visit (INDEPENDENT_AMBULATORY_CARE_PROVIDER_SITE_OTHER): Payer: Medicare Other | Admitting: *Deleted

## 2020-11-30 ENCOUNTER — Encounter: Payer: Self-pay | Admitting: Orthopaedic Surgery

## 2020-11-30 DIAGNOSIS — M25561 Pain in right knee: Secondary | ICD-10-CM | POA: Diagnosis not present

## 2020-11-30 DIAGNOSIS — Z5181 Encounter for therapeutic drug level monitoring: Secondary | ICD-10-CM

## 2020-11-30 DIAGNOSIS — M25512 Pain in left shoulder: Secondary | ICD-10-CM | POA: Diagnosis not present

## 2020-11-30 DIAGNOSIS — Z9981 Dependence on supplemental oxygen: Secondary | ICD-10-CM | POA: Diagnosis not present

## 2020-11-30 DIAGNOSIS — N184 Chronic kidney disease, stage 4 (severe): Secondary | ICD-10-CM | POA: Diagnosis not present

## 2020-11-30 DIAGNOSIS — I4819 Other persistent atrial fibrillation: Secondary | ICD-10-CM | POA: Diagnosis not present

## 2020-11-30 DIAGNOSIS — G8929 Other chronic pain: Secondary | ICD-10-CM | POA: Diagnosis not present

## 2020-11-30 DIAGNOSIS — N179 Acute kidney failure, unspecified: Secondary | ICD-10-CM | POA: Diagnosis not present

## 2020-11-30 DIAGNOSIS — E1122 Type 2 diabetes mellitus with diabetic chronic kidney disease: Secondary | ICD-10-CM | POA: Diagnosis not present

## 2020-11-30 DIAGNOSIS — I130 Hypertensive heart and chronic kidney disease with heart failure and stage 1 through stage 4 chronic kidney disease, or unspecified chronic kidney disease: Secondary | ICD-10-CM | POA: Diagnosis not present

## 2020-11-30 DIAGNOSIS — I5043 Acute on chronic combined systolic (congestive) and diastolic (congestive) heart failure: Secondary | ICD-10-CM | POA: Diagnosis not present

## 2020-11-30 DIAGNOSIS — Z952 Presence of prosthetic heart valve: Secondary | ICD-10-CM

## 2020-11-30 LAB — POCT INR: INR: 2.4 (ref 2.0–3.0)

## 2020-11-30 NOTE — Telephone Encounter (Signed)
Coumadin orders given to [facility].  See coumadin note.

## 2020-11-30 NOTE — Progress Notes (Signed)
PROCEDURE NOTE:  The patient request injection, verbal consent was obtained.  The left shoulder was prepped appropriately after time out was performed.   Sterile technique was observed and injection of 1 cc of Celestone 6 mg with several cc's of plain xylocaine. Anesthesia was provided by ethyl chloride and a 20-gauge needle was used to inject the shoulder area. A posterior approach was used.  The injection was tolerated well.  A band aid dressing was applied.  The patient was advised to apply ice later today and tomorrow to the injection sight as needed.  PROCEDURE NOTE:  The patient requests injections of the right knee , verbal consent was obtained.  The right knee was prepped appropriately after time out was performed.   Sterile technique was observed and injection of 1 cc of Celestone 6 mg with several cc's of plain xylocaine. Anesthesia was provided by ethyl chloride and a 20-gauge needle was used to inject the knee area. The injection was tolerated well.  A band aid dressing was applied.  The patient was advised to apply ice later today and tomorrow to the injection sight as needed.  See prn  Electronically Signed Sanjuana Kava, MD 3/8/202210:54 AM

## 2020-11-30 NOTE — Patient Instructions (Signed)
Spoke with [facility].  Continue warfarin 1 tablet  (10mg ) daily except for 1/2 tablet (5mg )  on Monday and Friday. [place] will recheck INR on 12/07/20 Will be d/c from home health next week

## 2020-11-30 NOTE — Telephone Encounter (Signed)
INR 2.4  PT 28.2  Per Janett Billow w/ [facility] - [phone]

## 2020-12-03 IMAGING — DX DG ABDOMEN ACUTE W/ 1V CHEST
4 series · 4 of 4 positions shown · non-contrast
Comparison: 01/02/2020, 08/02/2019 chest radiograph, CT chest
07/30/2018

CLINICAL DATA: Nausea

EXAM:
DG ABDOMEN ACUTE W/ 1V CHEST

[abdomen erect]
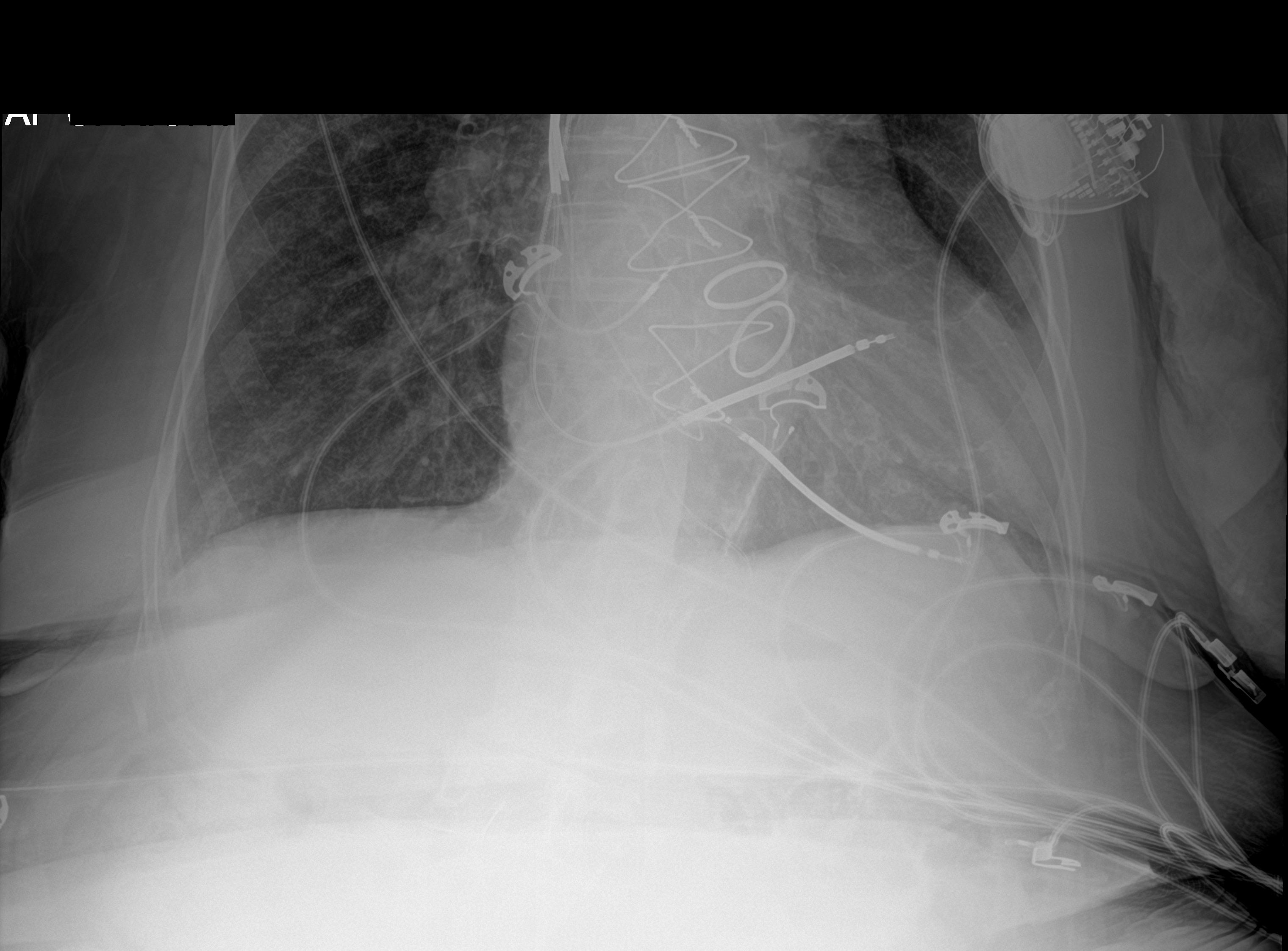

[abdomen supine (1 of 2)]
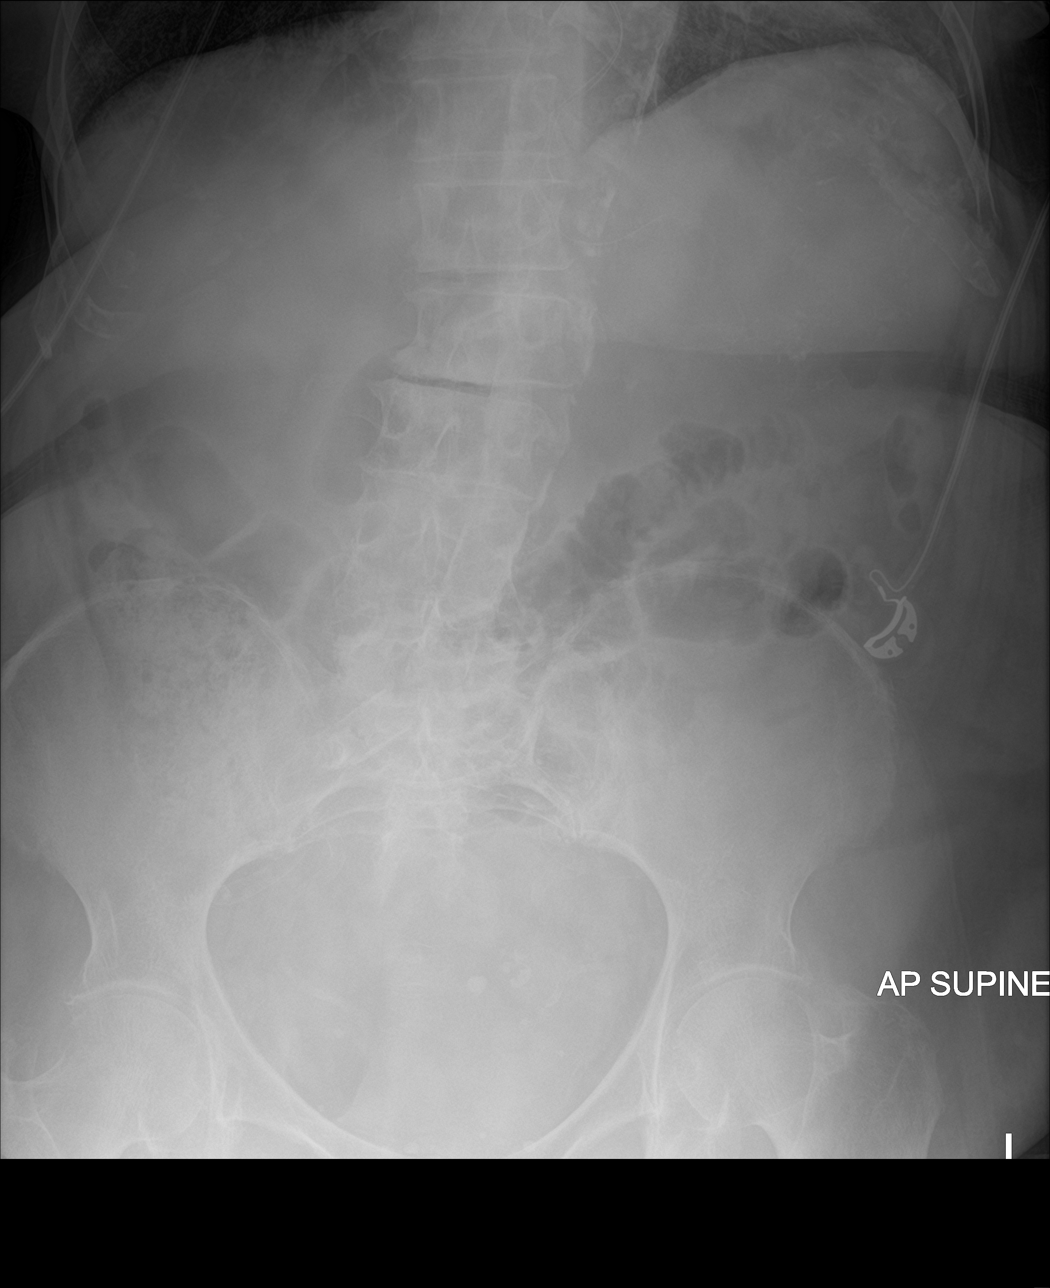

[chest ap]
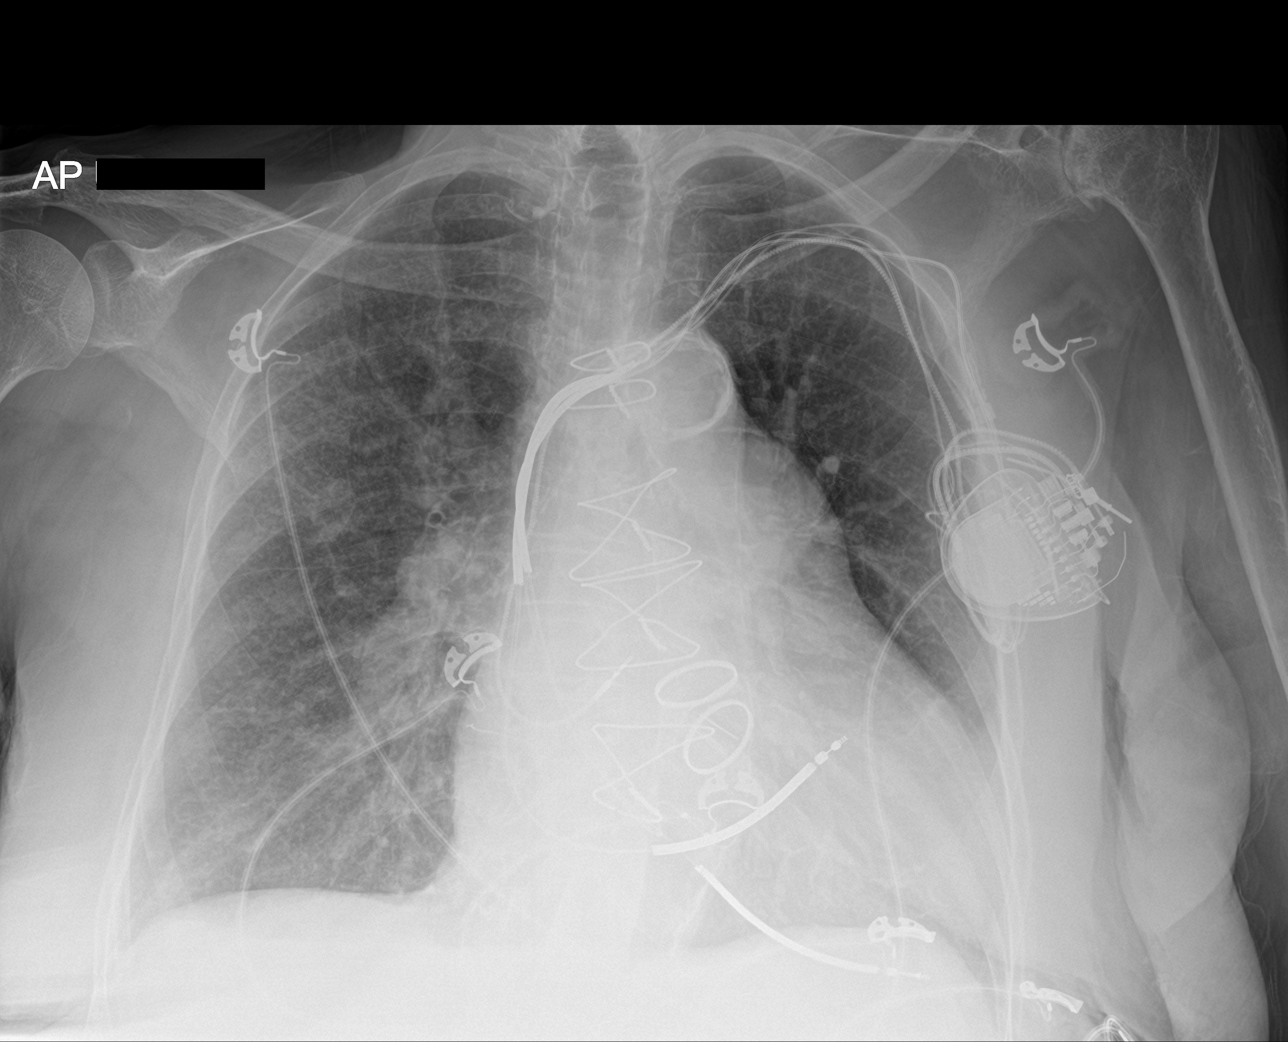

[abdomen supine (2 of 2)]
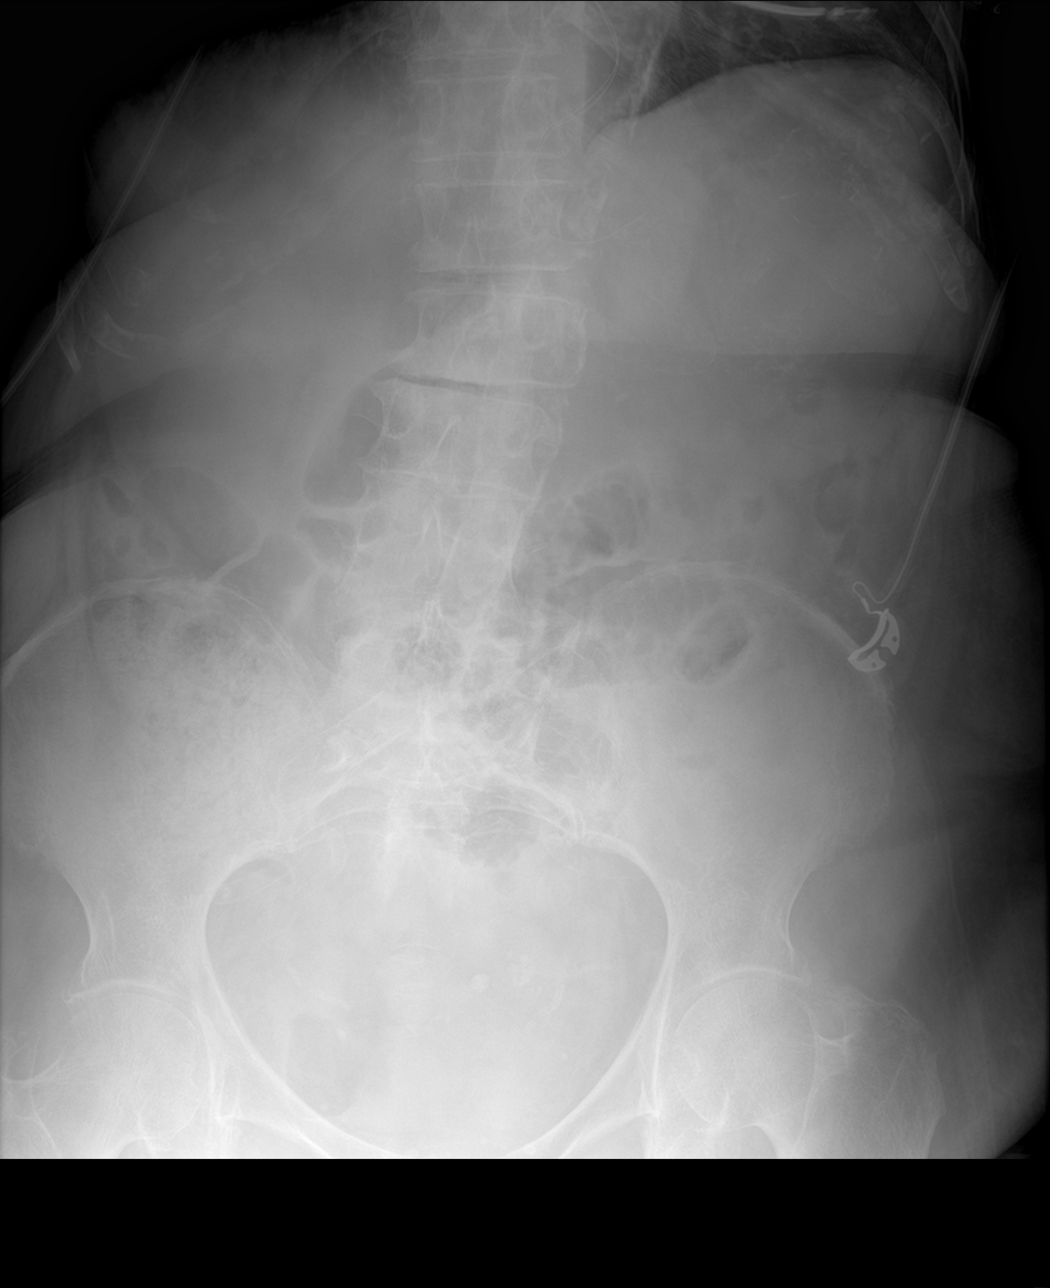

[4 of 4 positions shown; findings below may reference images not displayed]

FINDINGS: Single-view chest demonstrates post sternotomy changes and valve
prostheses. Left-sided pacing device as before. Cardiomegaly with
enlarged central pulmonary vessels. Vascular congestion with diffuse
interstitial and ground-glass opacity. This appears slightly
increased compared to prior.

Supine and upright views of the abdomen demonstrate no free air
beneath the diaphragm. Gas-filled nondilated central small bowel
loops with scattered colon gas. Dense vascular calcification.
IMPRESSION: 1. Cardiomegaly with central vascular congestion. Enlarged central
pulmonary vessels suggesting arterial hypertension.
2. Diffuse bilateral interstitial and ground-glass opacity some of
which is chronic though suspect that there is superimposed
interstitial process such as edema on the current exam.
3. Overall nonobstructed gas pattern

## 2020-12-06 ENCOUNTER — Telehealth: Payer: Self-pay

## 2020-12-06 ENCOUNTER — Other Ambulatory Visit: Payer: Self-pay | Admitting: Family Medicine

## 2020-12-06 DIAGNOSIS — N184 Chronic kidney disease, stage 4 (severe): Secondary | ICD-10-CM | POA: Diagnosis not present

## 2020-12-06 DIAGNOSIS — I5043 Acute on chronic combined systolic (congestive) and diastolic (congestive) heart failure: Secondary | ICD-10-CM | POA: Diagnosis not present

## 2020-12-06 DIAGNOSIS — I4819 Other persistent atrial fibrillation: Secondary | ICD-10-CM | POA: Diagnosis not present

## 2020-12-06 DIAGNOSIS — E1122 Type 2 diabetes mellitus with diabetic chronic kidney disease: Secondary | ICD-10-CM | POA: Diagnosis not present

## 2020-12-06 DIAGNOSIS — N179 Acute kidney failure, unspecified: Secondary | ICD-10-CM | POA: Diagnosis not present

## 2020-12-06 DIAGNOSIS — I130 Hypertensive heart and chronic kidney disease with heart failure and stage 1 through stage 4 chronic kidney disease, or unspecified chronic kidney disease: Secondary | ICD-10-CM | POA: Diagnosis not present

## 2020-12-06 NOTE — Telephone Encounter (Signed)
[name] [name] w/ [facility] called voicemail wanting to confirm what pt should have her oxygen set at. [name] [name] says she thought it was 2 L but pt has had it at 3 L. Please advise.

## 2020-12-07 ENCOUNTER — Ambulatory Visit (INDEPENDENT_AMBULATORY_CARE_PROVIDER_SITE_OTHER): Payer: Medicare Other | Admitting: *Deleted

## 2020-12-07 ENCOUNTER — Other Ambulatory Visit: Payer: Self-pay | Admitting: Family Medicine

## 2020-12-07 ENCOUNTER — Telehealth: Payer: Self-pay | Admitting: *Deleted

## 2020-12-07 DIAGNOSIS — E1122 Type 2 diabetes mellitus with diabetic chronic kidney disease: Secondary | ICD-10-CM | POA: Diagnosis not present

## 2020-12-07 DIAGNOSIS — Z952 Presence of prosthetic heart valve: Secondary | ICD-10-CM | POA: Diagnosis not present

## 2020-12-07 DIAGNOSIS — Z5181 Encounter for therapeutic drug level monitoring: Secondary | ICD-10-CM

## 2020-12-07 DIAGNOSIS — N184 Chronic kidney disease, stage 4 (severe): Secondary | ICD-10-CM | POA: Diagnosis not present

## 2020-12-07 DIAGNOSIS — N179 Acute kidney failure, unspecified: Secondary | ICD-10-CM | POA: Diagnosis not present

## 2020-12-07 DIAGNOSIS — I130 Hypertensive heart and chronic kidney disease with heart failure and stage 1 through stage 4 chronic kidney disease, or unspecified chronic kidney disease: Secondary | ICD-10-CM | POA: Diagnosis not present

## 2020-12-07 DIAGNOSIS — I5043 Acute on chronic combined systolic (congestive) and diastolic (congestive) heart failure: Secondary | ICD-10-CM | POA: Diagnosis not present

## 2020-12-07 DIAGNOSIS — I4819 Other persistent atrial fibrillation: Secondary | ICD-10-CM | POA: Diagnosis not present

## 2020-12-07 LAB — POCT INR: INR: 2.6 (ref 2.0–3.0)

## 2020-12-07 IMAGING — DX DG CHEST 1V PORT
1 series · 1 of 1 positions shown · non-contrast
Comparison: 01/02/2020

CLINICAL DATA: Patient presents with complaints of shortness of
breath that have not gotten better since being seen at [facility] a few days
ago. Patient states worsening exertional SOB. Patient is on 2L [HOSPITAL] at
home and her sats have been 95%+ on her O2. Patient states she has
not had to increase O2 demand. Patient hx COPD.

EXAM:
PORTABLE CHEST 1 VIEW

[chest ap]
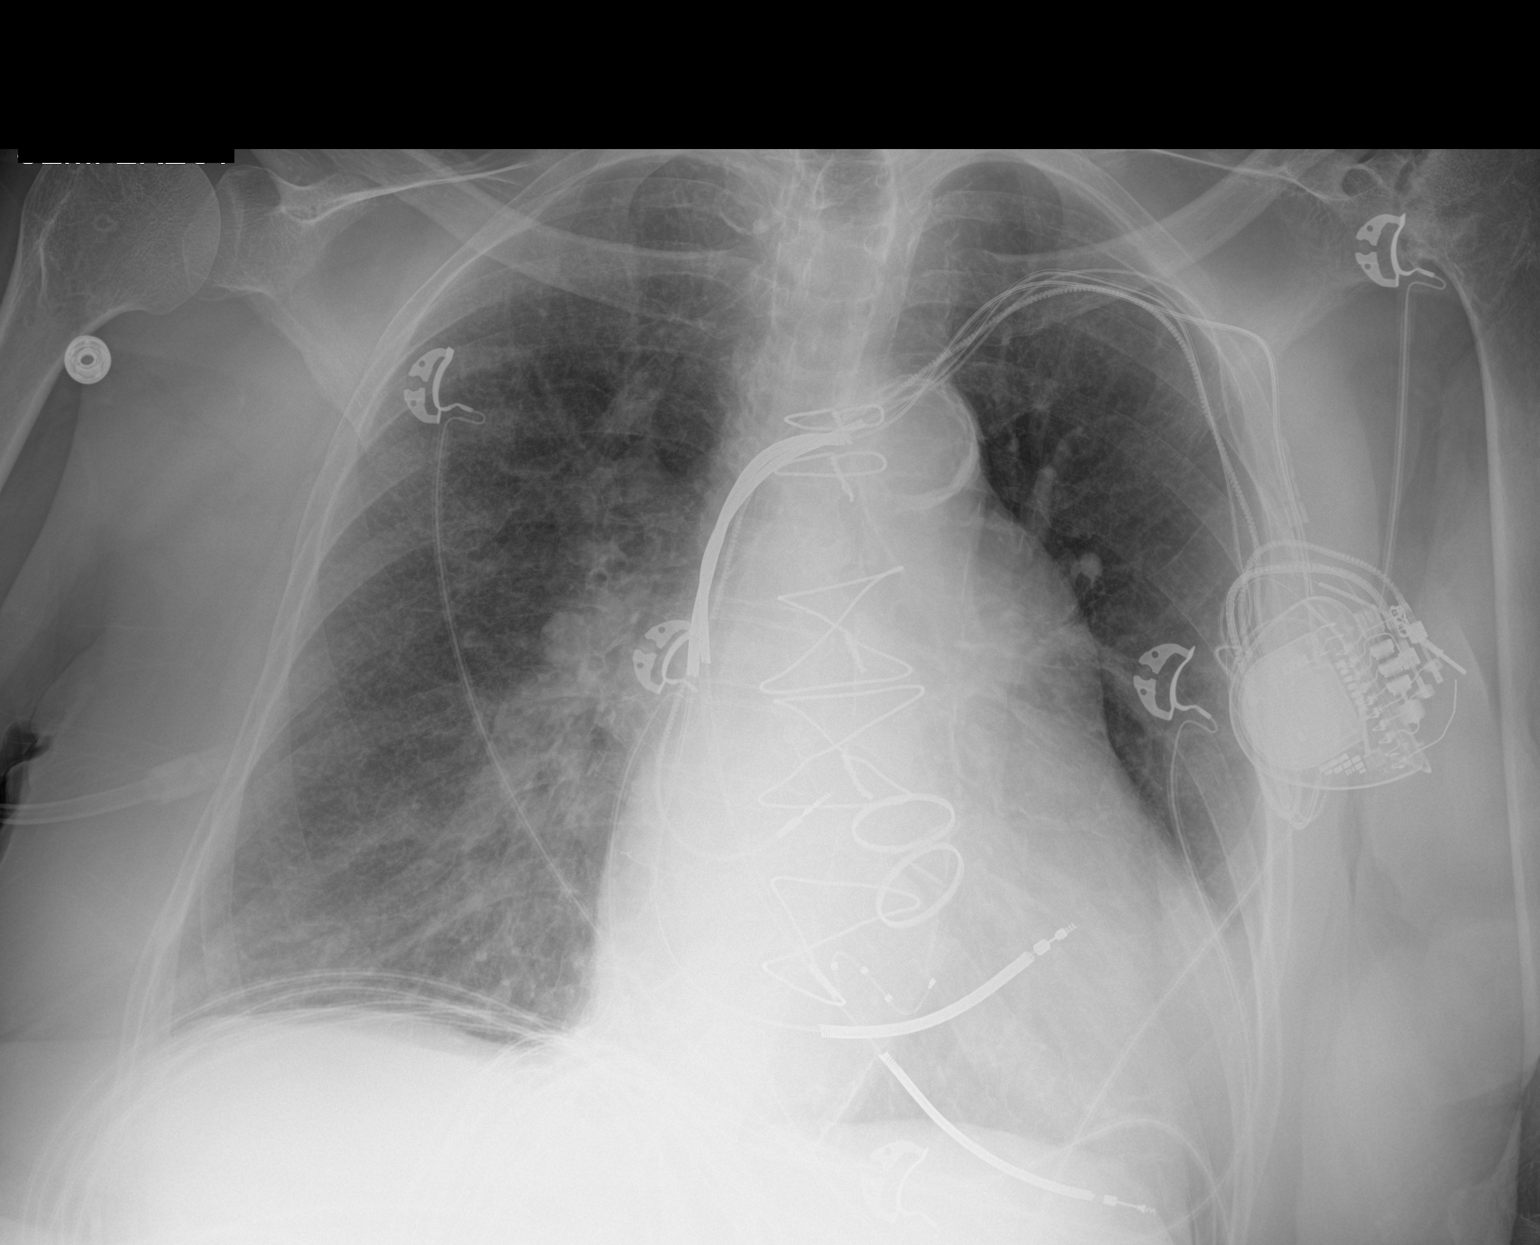

[1 of 1 positions shown; findings below may reference images not displayed]

FINDINGS: Stable changes from prior cardiac surgery and valve replacement.
Cardiac silhouette is mildly enlarged. Enlarged pulmonary arteries.
No mediastinal or hilar masses. Left anterior chest wall
biventricular cardioverter-defibrillator is stable.

Lungs demonstrate interstitial thickening similar to the prior
study. No lung consolidation. No convincing pulmonary edema.

No pleural effusion pneumothorax.

Skeletal structures are demineralized but grossly intact.
IMPRESSION: 1. No acute cardiopulmonary disease.
2. Cardiomegaly, changes from cardiac surgery and valve replacement,
chronic interstitial thickening and prominent pulmonary arteries
suggesting pulmonary hypertension, stable from the prior
radiographs.

## 2020-12-07 NOTE — Telephone Encounter (Signed)
Colletta Maryland from [facility] called to give [facility] patients info.  PT 31.0 INR 2.6  Today is her last visit, then the patient would have to come to office to have done.

## 2020-12-07 NOTE — Telephone Encounter (Signed)
Coumadin instructions given to [facility] and called pt/caregiver with next INR appt on 12/23/20 at 2:15pm.  They verbalized understanding.

## 2020-12-07 NOTE — Patient Instructions (Signed)
Spoke with [facility].  Continue warfarin 1 tablet  (10mg ) daily except for 1/2 tablet (5mg )  on Monday and Friday. Discharged from home health today Recheck in 2 weeks in the office

## 2020-12-08 ENCOUNTER — Telehealth: Payer: Self-pay

## 2020-12-08 NOTE — Telephone Encounter (Signed)
Verbal order given  

## 2020-12-08 NOTE — Telephone Encounter (Signed)
Richardson Landry called from [place] needs a verbal order for home health physical therapy recert for frequency 2 week - 3 or 1 week - 4  Richardson Landry # [phone]

## 2020-12-09 ENCOUNTER — Ambulatory Visit: Payer: Medicare Other | Attending: Critical Care Medicine

## 2020-12-09 DIAGNOSIS — Z23 Encounter for immunization: Secondary | ICD-10-CM

## 2020-12-09 NOTE — Progress Notes (Signed)
   Covid-19 Vaccination Clinic  Name:  [name] [name]    MRN: [id number] DOB: 05-04-1932  12/09/2020  [name] [name] was observed post Covid-19 immunization for 15 minutes without incident. She was provided with Vaccine Information Sheet and instruction to access the V-Safe system.   [name] [name] was instructed to call 911 with any severe reactions post vaccine: Marland Kitchen Difficulty breathing  . Swelling of face and throat  . A fast heartbeat  . A bad rash all over body  . Dizziness and weakness   Immunizations Administered    Name Date Dose VIS Date Route   Moderna Covid-19 Booster Vaccine 12/09/2020 12:48 AM 0.25 mL 07/14/2020 Intramuscular   Manufacturer: Moderna   Lot: [id number]   [place]: [id number]

## 2020-12-10 DIAGNOSIS — I4819 Other persistent atrial fibrillation: Secondary | ICD-10-CM | POA: Diagnosis not present

## 2020-12-10 DIAGNOSIS — I5043 Acute on chronic combined systolic (congestive) and diastolic (congestive) heart failure: Secondary | ICD-10-CM | POA: Diagnosis not present

## 2020-12-10 DIAGNOSIS — N184 Chronic kidney disease, stage 4 (severe): Secondary | ICD-10-CM | POA: Diagnosis not present

## 2020-12-10 DIAGNOSIS — I130 Hypertensive heart and chronic kidney disease with heart failure and stage 1 through stage 4 chronic kidney disease, or unspecified chronic kidney disease: Secondary | ICD-10-CM | POA: Diagnosis not present

## 2020-12-10 DIAGNOSIS — N179 Acute kidney failure, unspecified: Secondary | ICD-10-CM | POA: Diagnosis not present

## 2020-12-10 DIAGNOSIS — E1122 Type 2 diabetes mellitus with diabetic chronic kidney disease: Secondary | ICD-10-CM | POA: Diagnosis not present

## 2020-12-10 NOTE — Telephone Encounter (Signed)
LVM for [name] [name] to call office

## 2020-12-10 NOTE — Telephone Encounter (Signed)
Pls advise 2.5 L/min oxygen, if sat less than 90% increase to 3 L /min

## 2020-12-12 DIAGNOSIS — M4310 Spondylolisthesis, site unspecified: Secondary | ICD-10-CM | POA: Diagnosis not present

## 2020-12-12 DIAGNOSIS — M47816 Spondylosis without myelopathy or radiculopathy, lumbar region: Secondary | ICD-10-CM | POA: Diagnosis not present

## 2020-12-12 DIAGNOSIS — M1711 Unilateral primary osteoarthritis, right knee: Secondary | ICD-10-CM | POA: Diagnosis not present

## 2020-12-12 DIAGNOSIS — H409 Unspecified glaucoma: Secondary | ICD-10-CM | POA: Diagnosis not present

## 2020-12-12 DIAGNOSIS — E669 Obesity, unspecified: Secondary | ICD-10-CM | POA: Diagnosis not present

## 2020-12-12 DIAGNOSIS — E11649 Type 2 diabetes mellitus with hypoglycemia without coma: Secondary | ICD-10-CM | POA: Diagnosis not present

## 2020-12-12 DIAGNOSIS — R1310 Dysphagia, unspecified: Secondary | ICD-10-CM | POA: Diagnosis not present

## 2020-12-12 DIAGNOSIS — J9611 Chronic respiratory failure with hypoxia: Secondary | ICD-10-CM | POA: Diagnosis not present

## 2020-12-12 DIAGNOSIS — J449 Chronic obstructive pulmonary disease, unspecified: Secondary | ICD-10-CM | POA: Diagnosis not present

## 2020-12-12 DIAGNOSIS — I130 Hypertensive heart and chronic kidney disease with heart failure and stage 1 through stage 4 chronic kidney disease, or unspecified chronic kidney disease: Secondary | ICD-10-CM | POA: Diagnosis not present

## 2020-12-12 DIAGNOSIS — D509 Iron deficiency anemia, unspecified: Secondary | ICD-10-CM | POA: Diagnosis not present

## 2020-12-12 DIAGNOSIS — K5900 Constipation, unspecified: Secondary | ICD-10-CM | POA: Diagnosis not present

## 2020-12-12 DIAGNOSIS — N184 Chronic kidney disease, stage 4 (severe): Secondary | ICD-10-CM | POA: Diagnosis not present

## 2020-12-12 DIAGNOSIS — M48061 Spinal stenosis, lumbar region without neurogenic claudication: Secondary | ICD-10-CM | POA: Diagnosis not present

## 2020-12-12 DIAGNOSIS — I8393 Asymptomatic varicose veins of bilateral lower extremities: Secondary | ICD-10-CM | POA: Diagnosis not present

## 2020-12-12 DIAGNOSIS — I442 Atrioventricular block, complete: Secondary | ICD-10-CM | POA: Diagnosis not present

## 2020-12-12 DIAGNOSIS — E890 Postprocedural hypothyroidism: Secondary | ICD-10-CM | POA: Diagnosis not present

## 2020-12-12 DIAGNOSIS — R569 Unspecified convulsions: Secondary | ICD-10-CM | POA: Diagnosis not present

## 2020-12-12 DIAGNOSIS — E1122 Type 2 diabetes mellitus with diabetic chronic kidney disease: Secondary | ICD-10-CM | POA: Diagnosis not present

## 2020-12-12 DIAGNOSIS — I5042 Chronic combined systolic (congestive) and diastolic (congestive) heart failure: Secondary | ICD-10-CM | POA: Diagnosis not present

## 2020-12-12 DIAGNOSIS — I429 Cardiomyopathy, unspecified: Secondary | ICD-10-CM | POA: Diagnosis not present

## 2020-12-12 DIAGNOSIS — I4819 Other persistent atrial fibrillation: Secondary | ICD-10-CM | POA: Diagnosis not present

## 2020-12-12 DIAGNOSIS — G4700 Insomnia, unspecified: Secondary | ICD-10-CM | POA: Diagnosis not present

## 2020-12-12 DIAGNOSIS — I950 Idiopathic hypotension: Secondary | ICD-10-CM | POA: Diagnosis not present

## 2020-12-12 DIAGNOSIS — K219 Gastro-esophageal reflux disease without esophagitis: Secondary | ICD-10-CM | POA: Diagnosis not present

## 2020-12-13 DIAGNOSIS — N184 Chronic kidney disease, stage 4 (severe): Secondary | ICD-10-CM | POA: Diagnosis not present

## 2020-12-13 DIAGNOSIS — I4819 Other persistent atrial fibrillation: Secondary | ICD-10-CM | POA: Diagnosis not present

## 2020-12-13 DIAGNOSIS — E1122 Type 2 diabetes mellitus with diabetic chronic kidney disease: Secondary | ICD-10-CM | POA: Diagnosis not present

## 2020-12-13 DIAGNOSIS — I130 Hypertensive heart and chronic kidney disease with heart failure and stage 1 through stage 4 chronic kidney disease, or unspecified chronic kidney disease: Secondary | ICD-10-CM | POA: Diagnosis not present

## 2020-12-13 DIAGNOSIS — I5042 Chronic combined systolic (congestive) and diastolic (congestive) heart failure: Secondary | ICD-10-CM | POA: Diagnosis not present

## 2020-12-13 DIAGNOSIS — M1711 Unilateral primary osteoarthritis, right knee: Secondary | ICD-10-CM | POA: Diagnosis not present

## 2020-12-14 ENCOUNTER — Encounter: Payer: Self-pay | Admitting: Internal Medicine

## 2020-12-14 ENCOUNTER — Telehealth (INDEPENDENT_AMBULATORY_CARE_PROVIDER_SITE_OTHER): Payer: Medicare Other | Admitting: Internal Medicine

## 2020-12-14 ENCOUNTER — Other Ambulatory Visit: Payer: Medicare Other | Admitting: Nurse Practitioner

## 2020-12-14 ENCOUNTER — Other Ambulatory Visit: Payer: Self-pay

## 2020-12-14 DIAGNOSIS — R490 Dysphonia: Secondary | ICD-10-CM

## 2020-12-14 DIAGNOSIS — Z515 Encounter for palliative care: Secondary | ICD-10-CM

## 2020-12-14 NOTE — Progress Notes (Signed)
Virtual Visit via Telephone Note   This visit type was conducted due to national recommendations for restrictions regarding the COVID-19 Pandemic (e.g. social distancing) in an effort to limit this patient's exposure and mitigate transmission in our community.  Due to her co-morbid illnesses, this patient is at least at moderate risk for complications without adequate follow up.  This format is felt to be most appropriate for this patient at this time.  The patient did not have access to video technology/had technical difficulties with video requiring transitioning to audio format only (telephone).  All issues noted in this document were discussed and addressed.  No physical exam could be performed with this format.    Evaluation Performed:  Follow-up visit  Date:  12/14/2020   ID:  [name] [name], DOB 1931/11/27, MRN [id number]  Patient Location: Home Provider Location: Office/Clinic  Participants: Patient Location of Patient: Home Location of Provider: Telehealth Consent was obtain for visit to be over via telehealth. I verified that I am speaking with the correct person using two identifiers.  PCP:  Fayrene Helper, MD   Chief Complaint:  Hoarse voice  History of Present Illness:    [name] [name] is a 87 y.o. female who has a televisit for c/o hoarse voice for last 3 days. She also has chronic cough, but denies any fever, chills, sore throat, nausea, vomiting. Of note, she had booster dose of COVID vaccine about a day before she started having symptoms. She denies any facial numbness, tingling or facial droop. Denies dysphagia, odynophagia or dysphonia.  The patient does not have symptoms concerning for COVID-19 infection (fever, chills, cough, or new shortness of breath).   Past Medical, Surgical, Social History, Allergies, and Medications have been Reviewed.  Past Medical History:  Diagnosis Date  . Acute on chronic diastolic CHF (congestive heart failure) ([place])  05/19/2012  . Acute on chronic respiratory failure with hypoxia ([place]) 01/31/2020  . Adenomatous polyp of colon 12/16/2002   Dr. [name]/[facility]  . Allergy   . Calculus of gallbladder with chronic cholecystitis without obstruction 02/09/2009   Qualifier: Diagnosis of  By: [name], [name]    . Cataract   . CHB (complete heart block) ([place]) 10/07/2014      . CHF (congestive heart failure) ([place])    a.  EF previously reduced to 25-30% b. EF improved to 60-65% by echo in 10/2017.   Marland Kitchen Chronic gout due to renal impairment    Per [facility] Records [facility]   . Chronic kidney disease, stage I 2006  . Cognitive communication deficit    Per [facility]   . Constipation       . Convulsion ([place])    Per [facility]   . COPD (chronic obstructive pulmonary disease) ([place])       . DJD (degenerative joint disease) of lumbar spine   . DM (diabetes mellitus) ([place]) 2006  . Dysphagia, unspecified    Per [facility] Records [facility]   . Esophageal reflux   . Glaucoma   . Gout       . H/O adenomatous polyp of colon 05/24/2012   2004 in [place] colonoscopy 2008 normal    . H/O heart valve replacement with mechanical valve    a. s/p mechanical AVR in 2001 and mechanical MVR in 2004.  Marland Kitchen H/O wisdom  tooth extraction   . HIP PAIN, RIGHT 04/23/2009   Qualifier: Diagnosis of  By: [name], [name]    . Hyperkalemia    Per [facility]   . Hyperlipemia   . Hypertensive heart and kidney disease with HF and with CKD stage I-IV ([place])    Per [facility]   . IDA (iron deficiency anemia)    Parenteral iron/[name]  . Insomnia       . Localized edema    Left arm, Per [facility]   . Long term (current) use of anticoagulants    Per [facility]   . Nausea without vomiting 06/07/2010   Qualifier: Diagnosis of  By: [name], [name]    . Non-ischemic cardiomyopathy ([place])    a. s/p MDT CRTD  . Obesity, unspecified   . Oxygen deficiency 2014   nocturnal;  . Papilloma of breast 03/06/2012   This was diagnosed as a left breast papilloma by ultrasound characteristics and symptoms in 2010. Because of possible medical risk factors no surgical intervention has been done and it did doing annual followups. The area has been stable by physical examination and mammograms and ultrasound since 2010.   . Presence of cardiac pacemaker    Per [facility]   . Spinal stenosis of lumbar region 02/19/2013  . Spondylolisthesis   . Spondylosis   . Thyroid cancer ([place])    remote thyroidectomy, no recurrence, pt denies in 2016 thyroid cancer  . Type 2 diabetes mellitus with hyperglycemia ([place]) 07/15/2010  . Type 2 diabetes mellitus with hypoglycemia without coma ([place])    Per [facility]   . Unspecified chronic bronchitis ([place])    Per [facility]   . Unspecified hypothyroidism       . Urinary tract infection, site not specified    Per [facility]   . 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 Vertigo        Past Surgical History:  Procedure Laterality Date  . AORTIC AND MITRAL VALVE REPLACEMENT  2001  . BI-VENTRICULAR IMPLANTABLE CARDIOVERTER DEFIBRILLATOR UPGRADE N/A 01/18/2015   Procedure: BI-VENTRICULAR IMPLANTABLE CARDIOVERTER DEFIBRILLATOR UPGRADE;  Surgeon: Evans Lance, MD;  Location: [facility] CATH LAB;  Service: Cardiovascular;  Laterality: N/A;  . BIV ICD  GENERATOR CHANGEOUT N/A 01/30/2019   Procedure: BIV ICD GENERATOR CHANGEOUT;  Surgeon: Evans Lance, MD;  Location: [facility] CV LAB;  Service: Cardiovascular;  Laterality: N/A;  . CARDIAC CATHETERIZATION    . CARDIAC VALVE REPLACEMENT    . CARDIOVERSION N/A 11/13/2016   Procedure: CARDIOVERSION;  Surgeon: Sanda Klein, MD;  Location: [facility] ENDOSCOPY;  Service: Cardiovascular;  Laterality: N/A;  . COLONOSCOPY  06/12/2007   [place]- Normal rectum/Normal colon  . COLONOSCOPY  12/16/02   small hemorrhoids  . COLONOSCOPY  06/20/2012   Procedure: COLONOSCOPY;  Surgeon: Daneil Dolin, MD;  Location: [facility] ENDO SUITE;  Service: Endoscopy;  Laterality: N/A;  10:45  . defibrillator implanted 2006    . DOPPLER ECHOCARDIOGRAPHY  2012  . DOPPLER ECHOCARDIOGRAPHY  05,06,07,08,09,2011  . ESOPHAGOGASTRODUODENOSCOPY  06/12/2007   [place]k- normal esophagus, small hiatal hernia, otherwise normal stomach, D1, D2  . EYE SURGERY Right 2005  .  EYE SURGERY Left 2006  . ICD LEAD REMOVAL Left 02/08/2015   Procedure: ICD LEAD REMOVAL;  Surgeon: Evans Lance, MD;  Location: [facility] OR;  Service: Cardiovascular;  Laterality: Left;  "Will plan extraction and insertion of a BiV PM"  **Dr. Roxan Hockey backing up case**  . IMPLANTABLE CARDIOVERTER DEFIBRILLATOR (ICD) GENERATOR CHANGE Left 02/08/2015   Procedure: ICD GENERATOR CHANGE;  Surgeon: Evans Lance, MD;  Location: [facility];  Service: Cardiovascular;  Laterality: Left;  . INSERT / REPLACE / REMOVE PACEMAKER    . PACEMAKER INSERTION  May 2016  . pacemaker placed  2004  . right breast cyst removed     benign   . right cataract removed     2005  . TEE WITHOUT CARDIOVERSION N/A 11/13/2016   Procedure: TRANSESOPHAGEAL ECHOCARDIOGRAM (TEE);  Surgeon: Sanda Klein, MD;  Location: [facility] ENDOSCOPY;  Service: Cardiovascular;  Laterality: N/A;  . THYROIDECTOMY       Current Meds  Medication Sig  . acetaminophen (TYLENOL) 500 MG tablet Take 1 tablet (500 mg total) by mouth every  6 (six) hours as needed (pain).  Marland Kitchen albuterol (PROVENTIL) (2.5 MG/3ML) 0.083% nebulizer solution Take 3 mLs (2.5 mg total) by nebulization in the morning and at bedtime. *May use additional one time if needed for shortness of breath  . albuterol (VENTOLIN HFA) 108 (90 Base) MCG/ACT inhaler Inhale 1 puff into the lungs every 6 (six) hours as needed for wheezing or shortness of breath.  . allopurinol (ZYLOPRIM) 100 MG tablet TAKE ONE TABLET BY MOUTH ONCE DAILY.  . benzonatate (TESSALON) 100 MG capsule Take 1 capsule (100 mg total) by mouth 2 (two) times daily as needed for cough.  Marland Kitchen BREO ELLIPTA 100-25 MCG/INH AEPB INHALE (1) PUFF BY MOUTH INTO THE LUNGS DAILY  . cholecalciferol (VITAMIN D3) 25 MCG (1000 UNIT) tablet Take 1 tablet (1,000 Units total) by mouth in the morning.  . fluticasone (FLONASE) 50 MCG/ACT nasal spray Place 2 sprays into both nostrils daily.  . folic acid (FOLVITE) 1 MG tablet TAKE ONE TABLET BY MOUTH ONCE DAILY.  . furosemide (LASIX) 40 MG tablet Take 1 tablet (40 mg total) by mouth 2 (two) times daily.  Marland Kitchen gabapentin (NEURONTIN) 300 MG capsule TAKE (1) CAPSULE BY MOUTH TWICE DAILY  . hydrALAZINE (APRESOLINE) 25 MG tablet Take 25 mg by mouth 3 (three) times daily.  Marland Kitchen HYDROcodone-homatropine (HYCODAN) 5-1.5 MG/5ML syrup Take 5 mLs by mouth every 8 (eight) hours as needed for cough.  . levETIRAcetam (KEPPRA) 500 MG tablet TAKE (1) TABLET BY MOUTH TWICE DAILY AT 9AM AND 9PM  . levofloxacin (LEVAQUIN) 500 MG tablet Take 1 tablet (500 mg total) by mouth daily.  . Liniments (SALONPAS PAIN RELIEF PATCH EX) Apply 1 patch topically daily as needed (pain).   . metoprolol tartrate (LOPRESSOR) 50 MG tablet TAKE (1) TABLET BY MOUTH TWICE DAILY AT 9AM AND 9PM.  . nitroGLYCERIN (NITROSTAT) 0.4 MG SL tablet DISSOLVE 1 TABLET SUBLINGUALLY AS NEEDED FOR CHEST PAIN, MAY REPEAT EVERY 5 MINUTES. AFTER 3 CALL 911.  . NON FORMULARY Diet Change: Soft diet with dys 2 (ground) meats, thin liquids (NAS,  cons CHO, heart healthy)  . ondansetron (ZOFRAN) 4 MG tablet Take by mouth at bedtime.  . ondansetron (ZOFRAN-ODT) 4 MG disintegrating tablet TAKE (1) TABLET BY MOUTH EVERY EIGHT HOURS AS NEEDED FOR NAUSEA OR VOMITING  . OXYGEN Inhale 3 L into the lungs continuous.  [name] [name] (SYSTANE) 0.4-0.3 % SOLN Apply 1 drop to  eye at bedtime. Left eye  . polyethylene glycol (MIRALAX / GLYCOLAX) 17 g packet Take 17 g by mouth daily as needed.  . potassium chloride SA (KLOR-CON) 20 MEQ tablet Take 1 tablet (20 mEq total) by mouth daily.  . pravastatin (PRAVACHOL) 40 MG tablet TAKE ONE TABLET BY MOUTH ONCE DAILY.  Marland Kitchen senna (SENOKOT) 8.6 MG TABS tablet Take 2 tablets (17.2 mg total) by mouth daily as needed for mild constipation.  Marland Kitchen SYNTHROID 88 MCG tablet TAKE 1 TABLET BEFORE BREAKFAST.  Marland Kitchen UNABLE TO FIND Compression Stockings R-  Ankle  26.5cm   Calf 38cm  Leg  44cm L-  Ankle  27.5 cm   Calf 39cm  Leg  44cm (Patient taking differently: Compression Stockings R-  Ankle  26.5cm   Calf 38cm  Leg  44cm L-  Ankle  27.5 cm   Calf 39cm  Leg  44cm)  . UNABLE TO FIND 15-19 mmhg compression hose, knee high  . warfarin (COUMADIN) 10 MG tablet TAKE (1) TABLET BY MOUTH DAILY AT 4PM     Allergies:   Penicillins, Aspirin, Fentanyl, and Niacin   ROS:   Please see the history of present illness.     All other systems reviewed and are negative.   Labs/Other Tests and Data Reviewed:    Recent Labs: 02/16/2020: TSH 2.32 07/07/2020: Magnesium 2.1 07/08/2020: B Natriuretic Peptide 913.0 09/28/2020: ALT 19 11/23/2020: BUN 90; Creatinine, Ser 2.08; Hemoglobin 9.3; Platelets 211; Potassium 4.7; Sodium 139   Recent Lipid Panel Lab Results  Component Value Date/Time   CHOL 124 01/05/2020 12:53 PM   CHOL 165 10/16/2016 12:13 PM   TRIG 73 01/05/2020 12:53 PM   HDL 44 (L) 01/05/2020 12:53 PM   HDL 31 (L) 10/16/2016 12:13 PM   CHOLHDL 2.8 01/05/2020 12:53 PM   LDLCALC 65 01/05/2020 12:53 PM    Wt  Readings from Last 3 Encounters:  11/11/20 190 lb (86.2 kg)  10/27/20 196 lb 6.4 oz (89.1 kg)  09/28/20 190 lb (86.2 kg)     ASSESSMENT & PLAN:    Hoarse voice Unclear etiology Could be related to infectious/inflammatory Symptomatic treatment for now - warm/salt water gargles, jelly Advised to contact if any new symptoms like fever, chills, sore throat or neurological symptoms  Time:   Today, I have spent 9 minutes reviewing the chart, including problem list, medications, and with the patient with telehealth technology discussing the above problems.   Medication Adjustments/Labs and Tests Ordered: Current medicines are reviewed at length with the patient today.  Concerns regarding medicines are outlined above.   Tests Ordered: No orders of the defined types were placed in this encounter.   Medication Changes: No orders of the defined types were placed in this encounter.    Note: This dictation was prepared with Dragon dictation along with smaller phrase technology. Similar sounding words can be transcribed inadequately or may not be corrected upon review. Any transcriptional errors that result from this process are unintentional.      Disposition:  Follow up  Signed, Lindell Spar, MD  12/14/2020 9:56 AM     [employer]

## 2020-12-14 NOTE — Progress Notes (Signed)
[facility] Consult Note Telephone: [phone]  Fax: [phone]  PATIENT NAME: [name] [name] [address] [place] [address] [phone] (home)  DOB: June 22, 1932 MRN: [id number]  PRIMARY CARE PROVIDER:    Fayrene Helper, MD,  [address], [place] [address] [phone]  REFERRING PROVIDER:   Fayrene Helper, MD [address] [place],  [place] [address] [phone]  RESPONSIBLE PARTY:   Extended Emergency Contact Information Primary Emergency Contact: Lonni Fix, [place] [address] [place] of [place] Phone: [phone] Mobile Phone: [phone] Relation: Niece Secondary Emergency Contact: Deeann Cree, [place] [address] [place] of [place] Phone: [phone] Relation: Niece  I met face to face with patient in home. Caregiver   Mariam lynn present at visit.   ASSESSMENT AND RECOMMENDATIONS:   Advance Care Planning:  Today's visit consisted of building trust and discussions on Palliative care medicine as a specialized medical care for people living with serious illness, aimed at facilitating improved quality of life through symptoms relief, assisting with advance care planning and establishing goals of care. Patient expressed appreciation for education provided on Palliative care and how it differs from Hospice service. Palliative care will continue to provide support to patient, family and the medical team. Goal of care: Patient's goal of care is comfort while preserving function. Directives: After discussions on ramifications and implications of code status patient elected to be a full Code. Patient verbalized desire for full resuscitation efforts in the event of cardiac or respiratory arrest. She verbalized that she want to be resuscitated because that is what her niece would want.  Unable to reach niece on phone to discuss ACP, will  attempt to reach again.  Symptom Management:  Hoarseness: Patient report symptoms started 4 days ago, with no associated symptoms, no cough, no fever, no chills, no increased SOB, no throat pain, no difficulty swallowing. Of note patient received her Booster Covid-19 vaccine 2 days before onset of her symptoms. Patient had a Tele visit with Dr. Posey Pronto with [facility] earlier today. Her symptoms was thought to be likely related to infectious/inflammatory origin. Symptomatic treatment with salt water gargle was recommended. Patient report doing the salt water gargle and drinking hot tea. Poor appetite: Continue nutrition supplementation with one can of Ensure daily. Left Shoulder pain: Continue Tylenol as needed, may consider taking Tylenol scheduled twice or three time daily if worsening pain.  Follow up Palliative Care Visit: Palliative care will continue to follow for complex decision making and symptom management. Return in about 4 weeks or prn.  Family /Caregiver/Community Supports: Patient lived at home, her nephew lives in the home with her. 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 She paid personal caregiver for 4hrs Monday thru Friday.  Cognitive / Functional decline: Patient awake, alert and coherent. She requires moderate assist with her ADLs, ambulates with a front wheel walker, no report of recent falls.  I spent 50 minutes providing this consultation, time includes time spent with patient, chart review, provider coordination, and documentation. More than 50% of the time in this consultation was spent counseling and coordinating communication.   CHIEF COMPLAINT: Hoarsness  History obtained from review of EMR and discussion with patient and her caregiver. Records reviewed and summarized bellow.  HISTORY OF PRESENT ILLNESS:  [name] [name] is a 87 y.o. year old female with  multiple medical problems including diastolic HF (EF 73-53% 2992), CKD, COPD dependent on supplemental  oxygen, complete heart block s/p pacemaker (on Coumadin) followed by Coumadin clinic, DJD of left shoulder joint (received steroid injections to left shoulder joint), Type DM, Glaucoma, Gout, hx of thyroid cancer . Palliative Care was asked to follow this patient by consultation request of Fayrene Helper, MD to help address advance care planning and goals of care. This is an initial visit.  CODE STATUS: Full Code  PPS: 50%  HOSPICE ELIGIBILITY/DIAGNOSIS: TBD  ROS/patient Constitutional: denies fever, denies chills  EYES: denies vision changes ENMT: denies dysphagia Cardiovascular: denies chest pain, denies palpitation Pulmonary: denies cough, denies increased SOB Abdomen: endorses fair appetite, denies constipation, denies incontinence of bowel GU: denies dysuria, denies incontinence of urine MSK:  endorses ROM limitations, no falls reported Skin: denies rashes or wounds Neurological: endorses weakness, endorsed pain, denies insomnia Psych: Endorses positive mood Heme/lymph/immuno: denies bruises, abnormal bleeding   Physical Exam: Vital signs:BP 126/78 L arm), P 67, RR 18,  99% on O2 General: frail appearing, obese, sitting in recliner in NAD EYES: anicteric sclera, lids intact, no discharge  ENMT: intact hearing, oral mucous membranes moist, hoarseness noted CV:  +2 BLE edema Pulmonary: no increased work of breathing, no cough, no audible wheezes, room air Abdomen: no ascites GU: deferred MSK:  decreased ROM to left shoulder, no contractures, ambulatory Skin: warm and dry, no rashes or wounds on visible skin Neuro: Generalized weakness, A and O x 3 Psych: non-anxious affect today Hem/lymph/immuno: no widespread bruising   PAST MEDICAL HISTORY:  Past Medical History:  Diagnosis Date  . Acute on chronic diastolic CHF (congestive heart failure) ([place]) 05/19/2012  . Acute on chronic respiratory failure with hypoxia ([place]) 01/31/2020  . Adenomatous polyp of colon 12/16/2002    Dr. [name]/[facility]  . Allergy   . Calculus of gallbladder with chronic cholecystitis without obstruction 02/09/2009   Qualifier: Diagnosis of  By: [name], [name]    . Cataract   . CHB (complete heart block) ([place]) 10/07/2014      . CHF (congestive heart failure) ([place])    a.  EF previously reduced to 25-30% b. EF improved to 60-65% by echo in 10/2017.   Marland Kitchen Chronic gout due to renal impairment    Per [facility] Records System   . Chronic kidney disease, stage I 2006  . Cognitive communication deficit    Per [facility] Records [facility]   . Constipation       . Convulsion ([place])    Per [facility] Records [facility]   . COPD (chronic obstructive pulmonary disease) ([place])       . DJD (degenerative joint disease) of lumbar spine   . DM (diabetes mellitus) ([place]) 2006  . Dysphagia, unspecified    Per [facility] Records [facility]   . Esophageal reflux   . Glaucoma   . Gout       . H/O adenomatous polyp of colon 05/24/2012   2004 in [facility] colonoscopy 2008 normal    . H/O heart valve replacement with mechanical valve    a. s/p mechanical AVR in 2001 and mechanical MVR in 2004.  Marland Kitchen H/O wisdom tooth extraction   . HIP PAIN, RIGHT 04/23/2009   Qualifier: Diagnosis of  By: [name], [name]    . Hyperkalemia    Per  [facility] Records [facility]   . Hyperlipemia   . Hypertensive heart and kidney disease with HF and with CKD stage I-IV ([place])    Per [facility] Records [facility]   . IDA (iron deficiency anemia)    Parenteral iron/[name]  . Insomnia       . Localized edema    Left arm, Per [facility] Records [facility]   . Long term (current) use of anticoagulants    Per [facility]  Electronic Medical Records System   . Nausea without vomiting 06/07/2010   Qualifier: Diagnosis of  By: [name], [name]    . Non-ischemic cardiomyopathy ([place])    a. s/p MDT CRTD  . Obesity, unspecified   . Oxygen deficiency 2014   nocturnal;  . Papilloma of breast 03/06/2012   This was diagnosed as a left breast papilloma by ultrasound characteristics and symptoms in 2010. Because of possible medical risk factors no surgical intervention has been done and it did doing annual followups. The area has been stable by physical examination and mammograms and ultrasound since 2010.   . Presence of cardiac pacemaker    Per [facility] Records [facility]   . Spinal stenosis of lumbar region 02/19/2013  . Spondylolisthesis   . Spondylosis   . Thyroid cancer ([place])    remote thyroidectomy, no recurrence, pt denies in 2016 thyroid cancer  . Type 2 diabetes mellitus with hyperglycemia ([place]) 07/15/2010  . Type 2 diabetes mellitus with hypoglycemia without coma ([place])    Per [facility] Records [facility]   . Unspecified chronic bronchitis ([place])    Per [facility] Records [facility]   . Unspecified hypothyroidism       . Urinary tract infection, site not specified    Per [facility] Records [facility]   . 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 Vertigo         SOCIAL HX:  Social History   Tobacco Use  . Smoking status: Former Smoker    Packs/day: 0.75    Years: 40.00    Pack years: 30.00    Types: Cigarettes    Quit date: 03/08/1987    Years since quitting: 33.7  . Smokeless tobacco: Never Used  Substance Use Topics  . Alcohol use: No    Alcohol/week: 0.0 standard drinks   FAMILY HX:  Family History  Problem Relation Age of Onset  . Pancreatic cancer Mother   . Heart disease Father   . Heart disease Sister   . Heart attack  Sister   . Heart disease Brother   . Diabetes Brother   . Heart disease Brother   . Diabetes Brother   . Hypertension Brother   . Lung cancer Brother     ALLERGIES:  Allergies  Allergen Reactions  . Penicillins Other (See Comments)    Bruise    . Aspirin Other (See Comments)    On coumadin  . Fentanyl Dermatitis    Rash with patch   . Niacin Itching     PERTINENT MEDICATIONS:   Outpatient Encounter Medications as of 12/14/2020  Medication Sig  . acetaminophen (TYLENOL) 500 MG tablet Take 1 tablet (500 mg total) by mouth every 6 (six) hours as needed (pain).  Marland Kitchen albuterol (PROVENTIL) (2.5 MG/3ML) 0.083% nebulizer solution Take 3 mLs (2.5  mg total) by nebulization in the morning and at bedtime. *May use additional one time if needed for shortness of breath  . albuterol (VENTOLIN HFA) 108 (90 Base) MCG/ACT inhaler Inhale 1 puff into the lungs every 6 (six) hours as needed for wheezing or shortness of breath.  . allopurinol (ZYLOPRIM) 100 MG tablet TAKE ONE TABLET BY MOUTH ONCE DAILY.  . benzonatate (TESSALON) 100 MG capsule Take 1 capsule (100 mg total) by mouth 2 (two) times daily as needed for cough.  Marland Kitchen BREO ELLIPTA 100-25 MCG/INH AEPB INHALE (1) PUFF BY MOUTH INTO THE LUNGS DAILY  . cholecalciferol (VITAMIN D3) 25 MCG (1000 UNIT) tablet Take 1 tablet (1,000 Units total) by mouth in the morning.  . fluticasone (FLONASE) 50 MCG/ACT nasal spray Place 2 sprays into both nostrils daily.  . folic acid (FOLVITE) 1 MG tablet TAKE ONE TABLET BY MOUTH ONCE DAILY.  . furosemide (LASIX) 40 MG tablet Take 1 tablet (40 mg total) by mouth 2 (two) times daily.  Marland Kitchen gabapentin (NEURONTIN) 300 MG capsule TAKE (1) CAPSULE BY MOUTH TWICE DAILY  . hydrALAZINE (APRESOLINE) 25 MG tablet Take 25 mg by mouth 3 (three) times daily.  Marland Kitchen HYDROcodone-homatropine (HYCODAN) 5-1.5 MG/5ML syrup Take 5 mLs by mouth every 8 (eight) hours as needed for cough.  . levETIRAcetam (KEPPRA) 500 MG tablet TAKE (1) TABLET BY  MOUTH TWICE DAILY AT 9AM AND 9PM  . levofloxacin (LEVAQUIN) 500 MG tablet Take 1 tablet (500 mg total) by mouth daily.  . Liniments (SALONPAS PAIN RELIEF PATCH EX) Apply 1 patch topically daily as needed (pain).   . metoprolol tartrate (LOPRESSOR) 50 MG tablet TAKE (1) TABLET BY MOUTH TWICE DAILY AT 9AM AND 9PM.  . nitroGLYCERIN (NITROSTAT) 0.4 MG SL tablet DISSOLVE 1 TABLET SUBLINGUALLY AS NEEDED FOR CHEST PAIN, MAY REPEAT EVERY 5 MINUTES. AFTER 3 CALL 911.  . NON FORMULARY Diet Change: Soft diet with dys 2 (ground) meats, thin liquids (NAS, cons CHO, heart healthy)  . ondansetron (ZOFRAN) 4 MG tablet Take by mouth at bedtime.  . ondansetron (ZOFRAN-ODT) 4 MG disintegrating tablet TAKE (1) TABLET BY MOUTH EVERY EIGHT HOURS AS NEEDED FOR NAUSEA OR VOMITING  . OXYGEN Inhale 3 L into the lungs continuous.  [name] [name] (SYSTANE) 0.4-0.3 % SOLN Apply 1 drop to eye at bedtime. Left eye  . polyethylene glycol (MIRALAX / GLYCOLAX) 17 g packet Take 17 g by mouth daily as needed.  . potassium chloride SA (KLOR-CON) 20 MEQ tablet Take 1 tablet (20 mEq total) by mouth daily.  . pravastatin (PRAVACHOL) 40 MG tablet TAKE ONE TABLET BY MOUTH ONCE DAILY.  Marland Kitchen senna (SENOKOT) 8.6 MG TABS tablet Take 2 tablets (17.2 mg total) by mouth daily as needed for mild constipation.  Marland Kitchen SYNTHROID 88 MCG tablet TAKE 1 TABLET BEFORE BREAKFAST.  Marland Kitchen UNABLE TO FIND Compression Stockings R-  Ankle  26.5cm   Calf 38cm  Leg  44cm L-  Ankle  27.5 cm   Calf 39cm  Leg  44cm (Patient taking differently: Compression Stockings R-  Ankle  26.5cm   Calf 38cm  Leg  44cm L-  Ankle  27.5 cm   Calf 39cm  Leg  44cm)  . UNABLE TO FIND 15-19 mmhg compression hose, knee high  . warfarin (COUMADIN) 10 MG tablet TAKE (1) TABLET BY MOUTH DAILY AT 4PM    Thank you for the opportunity to participate in the care of Ms. [employer]. The palliative care team will continue to  follow. Please call our office at [phone] if we can  be of additional assistance.  Jari Favre, DNP, AGPCNP-BC

## 2020-12-14 NOTE — Patient Instructions (Addendum)
Please perform warm/salt water gargles. Okay to take jelly to help soothing the throat.  Please contact us if you have any new symptoms like fever, chills, sore throat or neurological symptoms (numbness, weakness, tingling or vision changes). Hoarseness  Hoarseness, also called dysphonia, is any abnormal change in your voice that can make it difficult to speak. Your voice may sound raspy, breathy, or strained. Hoarseness is caused by a problem with your vocal cords (vocal folds). These are two bands of tissue inside your voice box (larynx). When you speak, your vocal cords move back and forth to create sound. The surfaces of your vocal cords need to be smooth for your voice to sound clear. Swelling or lumps on your vocal cords can cause hoarseness. Common causes of vocal cord problems include:  Infection in the nose, throat, and upper air passages (upper respiratory infection).  A long-term cough.  Straining or overusing your voice.  Smoking, or exposure to secondhand smoke.  Allergies.  Medication side effects.  Vocal cord growths.  Vocal cord injuries.  Stomach acids that move up in your throat and irritate your vocal cords (gastroesophageal reflux).  Diseases that affect the nervous system, such as a stroke or Parkinson's disease. Follow these instructions at home: Watch your condition for any changes. To ease discomfort and protect your vocal cords:  Rest your voice.  Do not whisper. Whispering can cause muscle strain.  Do not speak in a loud or harsh voice.  Avoid coughing or clearing your throat.  Do not use any products that contain nicotine or tobacco, such as cigarettes and e-cigarettes. If you need help quitting, ask your health care provider.  Avoid secondhand smoke.  Do not eat foods that give you heartburn, such as spicy or acidic foods like hot peppers and orange juice. Heartburn can make gastroesophageal reflux worse.  Do not drink beverages that contain  caffeine (coffee, tea, or soft drinks) or alcohol (beer, wine, or liquor).  Drink enough fluid to keep your urine pale yellow.  Use a humidifier if the air in your home is dry. If recommended by your health care provider, schedule an appointment with a speech-language specialist. This specialist may give you methods to try that can help you avoid misusing your voice.   Contact a health care provider if:  You have hoarseness that lasts longer than 3 weeks.  You almost lose or completely lose your voice for more than 3 days.  You have pain when you swallow or try to talk.  You feel a lump in your neck. Get help right away if:  You have trouble swallowing.  You feel like you are choking when you swallow.  You cough up blood or vomit blood.  You have trouble breathing.  You choke, cannot swallow, or cannot breathe if you lie flat.  You notice swelling or a rash on your body, face, or tongue. Summary  Hoarseness, also called dysphonia, is any abnormal change in your voice that can make it difficult to speak. Your voice may sound raspy, breathy, or strained.  Hoarseness is caused by a problem with your vocal cords (vocal folds).  Do not speak in a loud or harsh voice, use nicotine or tobacco products, or eat foods that give you heartburn.  If recommended by your health care provider, meet with a speech-language specialist. This information is not intended to replace advice given to you by your health care provider. Make sure you discuss any questions you have with your health care  provider. Document Revised: 08/24/2017 Document Reviewed: 06/08/2017 Elsevier Patient Education  2021 [employer].

## 2020-12-15 ENCOUNTER — Ambulatory Visit: Payer: Medicare Other | Admitting: Internal Medicine

## 2020-12-15 ENCOUNTER — Telehealth: Payer: Self-pay

## 2020-12-15 DIAGNOSIS — N184 Chronic kidney disease, stage 4 (severe): Secondary | ICD-10-CM | POA: Diagnosis not present

## 2020-12-15 DIAGNOSIS — M1711 Unilateral primary osteoarthritis, right knee: Secondary | ICD-10-CM | POA: Diagnosis not present

## 2020-12-15 DIAGNOSIS — E1122 Type 2 diabetes mellitus with diabetic chronic kidney disease: Secondary | ICD-10-CM | POA: Diagnosis not present

## 2020-12-15 DIAGNOSIS — I4819 Other persistent atrial fibrillation: Secondary | ICD-10-CM | POA: Diagnosis not present

## 2020-12-15 DIAGNOSIS — I130 Hypertensive heart and chronic kidney disease with heart failure and stage 1 through stage 4 chronic kidney disease, or unspecified chronic kidney disease: Secondary | ICD-10-CM | POA: Diagnosis not present

## 2020-12-15 DIAGNOSIS — I5042 Chronic combined systolic (congestive) and diastolic (congestive) heart failure: Secondary | ICD-10-CM | POA: Diagnosis not present

## 2020-12-15 NOTE — Telephone Encounter (Signed)
Advanced nurse Anne Ng called and said that [name] [name] was breathing rapid and shallow and her resp rate was 33. I advised ER but [name] refused. The nurse then wanted her scheduled to come into the office tomorrow but there were no openings. Advised if we had a cancellation we would cal her back but if she got worse in the meantime to go to the ER   [place]- [phone]

## 2020-12-16 ENCOUNTER — Emergency Department (HOSPITAL_COMMUNITY)
Admission: EM | Admit: 2020-12-16 | Discharge: 2020-12-16 | Disposition: A | Discharge status: Home / Self Care | Payer: Medicare Other | Attending: Emergency Medicine | Admitting: Emergency Medicine

## 2020-12-16 ENCOUNTER — Encounter (HOSPITAL_COMMUNITY): Payer: Self-pay | Admitting: *Deleted

## 2020-12-16 ENCOUNTER — Emergency Department (HOSPITAL_COMMUNITY): Payer: Medicare Other

## 2020-12-16 ENCOUNTER — Other Ambulatory Visit: Payer: Self-pay

## 2020-12-16 DIAGNOSIS — J811 Chronic pulmonary edema: Secondary | ICD-10-CM | POA: Diagnosis not present

## 2020-12-16 DIAGNOSIS — W1839XA Other fall on same level, initial encounter: Secondary | ICD-10-CM | POA: Diagnosis not present

## 2020-12-16 DIAGNOSIS — Z87891 Personal history of nicotine dependence: Secondary | ICD-10-CM | POA: Insufficient documentation

## 2020-12-16 DIAGNOSIS — S4992XA Unspecified injury of left shoulder and upper arm, initial encounter: Secondary | ICD-10-CM | POA: Insufficient documentation

## 2020-12-16 DIAGNOSIS — E785 Hyperlipidemia, unspecified: Secondary | ICD-10-CM | POA: Diagnosis not present

## 2020-12-16 DIAGNOSIS — Z79899 Other long term (current) drug therapy: Secondary | ICD-10-CM | POA: Diagnosis not present

## 2020-12-16 DIAGNOSIS — D631 Anemia in chronic kidney disease: Secondary | ICD-10-CM | POA: Diagnosis not present

## 2020-12-16 DIAGNOSIS — J040 Acute laryngitis: Secondary | ICD-10-CM

## 2020-12-16 DIAGNOSIS — Z7901 Long term (current) use of anticoagulants: Secondary | ICD-10-CM | POA: Insufficient documentation

## 2020-12-16 DIAGNOSIS — Z7951 Long term (current) use of inhaled steroids: Secondary | ICD-10-CM | POA: Insufficient documentation

## 2020-12-16 DIAGNOSIS — E11649 Type 2 diabetes mellitus with hypoglycemia without coma: Secondary | ICD-10-CM | POA: Diagnosis not present

## 2020-12-16 DIAGNOSIS — E890 Postprocedural hypothyroidism: Secondary | ICD-10-CM | POA: Insufficient documentation

## 2020-12-16 DIAGNOSIS — N184 Chronic kidney disease, stage 4 (severe): Secondary | ICD-10-CM | POA: Insufficient documentation

## 2020-12-16 DIAGNOSIS — E1169 Type 2 diabetes mellitus with other specified complication: Secondary | ICD-10-CM | POA: Insufficient documentation

## 2020-12-16 DIAGNOSIS — I5023 Acute on chronic systolic (congestive) heart failure: Secondary | ICD-10-CM | POA: Insufficient documentation

## 2020-12-16 DIAGNOSIS — I509 Heart failure, unspecified: Secondary | ICD-10-CM | POA: Diagnosis not present

## 2020-12-16 DIAGNOSIS — Z8616 Personal history of COVID-19: Secondary | ICD-10-CM | POA: Insufficient documentation

## 2020-12-16 DIAGNOSIS — Z85850 Personal history of malignant neoplasm of thyroid: Secondary | ICD-10-CM | POA: Diagnosis not present

## 2020-12-16 DIAGNOSIS — Z950 Presence of cardiac pacemaker: Secondary | ICD-10-CM | POA: Diagnosis not present

## 2020-12-16 DIAGNOSIS — I130 Hypertensive heart and chronic kidney disease with heart failure and stage 1 through stage 4 chronic kidney disease, or unspecified chronic kidney disease: Secondary | ICD-10-CM | POA: Insufficient documentation

## 2020-12-16 DIAGNOSIS — E1122 Type 2 diabetes mellitus with diabetic chronic kidney disease: Secondary | ICD-10-CM | POA: Diagnosis not present

## 2020-12-16 DIAGNOSIS — K219 Gastro-esophageal reflux disease without esophagitis: Secondary | ICD-10-CM | POA: Diagnosis not present

## 2020-12-16 DIAGNOSIS — J449 Chronic obstructive pulmonary disease, unspecified: Secondary | ICD-10-CM | POA: Diagnosis not present

## 2020-12-16 DIAGNOSIS — R0602 Shortness of breath: Secondary | ICD-10-CM | POA: Diagnosis not present

## 2020-12-16 DIAGNOSIS — I517 Cardiomegaly: Secondary | ICD-10-CM | POA: Diagnosis not present

## 2020-12-16 DIAGNOSIS — J441 Chronic obstructive pulmonary disease with (acute) exacerbation: Secondary | ICD-10-CM | POA: Diagnosis not present

## 2020-12-16 DIAGNOSIS — M19012 Primary osteoarthritis, left shoulder: Secondary | ICD-10-CM | POA: Diagnosis not present

## 2020-12-16 LAB — COMPREHENSIVE METABOLIC PANEL
ALT: 33 U/L (ref 0–44)
AST: 54 U/L — ABNORMAL HIGH (ref 15–41)
Albumin: 3.3 g/dL — ABNORMAL LOW (ref 3.5–5.0)
Alkaline Phosphatase: 50 U/L (ref 38–126)
Anion gap: 7 (ref 5–15)
BUN: 50 mg/dL — ABNORMAL HIGH (ref 8–23)
CO2: 33 mmol/L — ABNORMAL HIGH (ref 22–32)
Calcium: 8.7 mg/dL — ABNORMAL LOW (ref 8.9–10.3)
Chloride: 97 mmol/L — ABNORMAL LOW (ref 98–111)
Creatinine, Ser: 1.42 mg/dL — ABNORMAL HIGH (ref 0.44–1.00)
GFR, Estimated: 36 mL/min — ABNORMAL LOW (ref >60)
Glucose, Bld: 108 mg/dL — ABNORMAL HIGH (ref 70–99)
Potassium: 4.8 mmol/L (ref 3.5–5.1)
Sodium: 137 mmol/L (ref 135–145)
Total Bilirubin: 0.6 mg/dL (ref 0.3–1.2)
Total Protein: 6.8 g/dL (ref 6.5–8.1)

## 2020-12-16 LAB — CBC WITH DIFFERENTIAL/PLATELET
Abs Immature Granulocytes: 0.02 10*3/uL (ref 0.00–0.07)
Basophils Absolute: 0 10*3/uL (ref 0.0–0.1)
Basophils Relative: 0 %
Eosinophils Absolute: 0 10*3/uL (ref 0.0–0.5)
Eosinophils Relative: 0 %
HCT: 32.3 % — ABNORMAL LOW (ref 36.0–46.0)
Hemoglobin: 9.7 g/dL — ABNORMAL LOW (ref 12.0–15.0)
Immature Granulocytes: 0 %
Lymphocytes Relative: 11 %
Lymphs Abs: 0.5 10*3/uL — ABNORMAL LOW (ref 0.7–4.0)
MCH: 28 pg (ref 26.0–34.0)
MCHC: 30 g/dL (ref 30.0–36.0)
MCV: 93.4 fL (ref 80.0–100.0)
Monocytes Absolute: 0.3 10*3/uL (ref 0.1–1.0)
Monocytes Relative: 7 %
Neutro Abs: 3.7 10*3/uL (ref 1.7–7.7)
Neutrophils Relative %: 82 %
Platelets: 153 10*3/uL (ref 150–400)
RBC: 3.46 MIL/uL — ABNORMAL LOW (ref 3.87–5.11)
RDW: 18.3 % — ABNORMAL HIGH (ref 11.5–15.5)
WBC: 4.6 10*3/uL (ref 4.0–10.5)
nRBC: 0 % (ref 0.0–0.2)

## 2020-12-16 LAB — TROPONIN I (HIGH SENSITIVITY)
Troponin I (High Sensitivity): 36 ng/L — ABNORMAL HIGH (ref <18)
Troponin I (High Sensitivity): 38 ng/L — ABNORMAL HIGH (ref <18)

## 2020-12-16 LAB — BRAIN NATRIURETIC PEPTIDE: B Natriuretic Peptide: 416 pg/mL — ABNORMAL HIGH (ref 0.0–100.0)

## 2020-12-16 MED ORDER — FUROSEMIDE 10 MG/ML IJ SOLN
20.0000 mg | Freq: Once | INTRAMUSCULAR | Status: AC
  Administered 2020-12-16: 20 mg via INTRAVENOUS
  Filled 2020-12-16: qty 2

## 2020-12-16 NOTE — Discharge Instructions (Signed)
Return if any problems.

## 2020-12-16 NOTE — ED Notes (Signed)
Caregiver reported pt was wet d/t purewick failing and asked for nurse to change pt. States she is going to pt's house to get dry clothes. Nurse went in room to change patient but patient refused and states she would like to wait until her dry clothes arrive.

## 2020-12-16 NOTE — ED Provider Notes (Signed)
[facility] EMERGENCY DEPARTMENT Provider Note   CSN: [id number] Arrival date & time: 12/16/20  1144     History Chief Complaint  Patient presents with  . Shoulder Injury    [name] [name] is a 87 y.o. female.  Pt complains of shortness of breath.  Pt reports her aide said her voice had changed today.  Pt reports she has had shoulder pain for a while and thinks if is getting worse.    The history is provided by the patient. No language interpreter was used.  Shoulder Injury This is a chronic problem. The problem occurs constantly. The problem has been gradually worsening. Nothing aggravates the symptoms. Nothing relieves the symptoms. She has tried nothing for the symptoms. The treatment provided no relief.       Past Medical History:  Diagnosis Date  . Acute on chronic diastolic CHF (congestive heart failure) ([place]) 05/19/2012  . Acute on chronic respiratory failure with hypoxia ([employer]) 01/31/2020  . Adenomatous polyp of colon 12/16/2002   Dr. [name]/[facility]  . Allergy   . Calculus of gallbladder with chronic cholecystitis without obstruction 02/09/2009   Qualifier: Diagnosis of  By: [name], [name]    . Cataract   . CHB (complete heart block) ([place]) 10/07/2014      . CHF (congestive heart failure) ([place])    a.  EF previously reduced to 25-30% b. EF improved to 60-65% by echo in 10/2017.   Marland Kitchen Chronic gout due to renal impairment    Per [facility] Electronic Medical Records System   . Chronic kidney disease, stage I 2006  . Cognitive communication deficit    Per [facility] Records [facility]   . Constipation       . Convulsion ([place])    Per [facility] Electronic Medical Records [facility]   . COPD (chronic obstructive pulmonary disease) ([place])       . DJD (degenerative joint disease) of lumbar spine   . DM (diabetes mellitus) ([place]) 2006  . Dysphagia, unspecified    Per  [facility] Electronic Medical Records System   . Esophageal reflux   . Glaucoma   . Gout       . H/O adenomatous polyp of colon 05/24/2012   2004 in [place] colonoscopy 2008 normal    . H/O heart valve replacement with mechanical valve    a. s/p mechanical AVR in 2001 and mechanical MVR in 2004.  Marland Kitchen H/O wisdom tooth extraction   . HIP PAIN, RIGHT 04/23/2009   Qualifier: Diagnosis of  By: [name], [name]    . Hyperkalemia    Per [facility] Electronic Medical Records [facility]   . Hyperlipemia   . Hypertensive heart and kidney disease with HF and with CKD stage I-IV ([place])    Per [facility] Electronic Medical Records [facility]   . IDA (iron deficiency anemia)    Parenteral iron/[name]  . Insomnia       . Localized edema    Left arm, Per [facility] Electronic Medical Records System   . Long term (current) use of anticoagulants    Per [facility] Electronic Medical Records [facility]   . Nausea without vomiting 06/07/2010   Qualifier: Diagnosis of  By: [name], [name]    . Non-ischemic cardiomyopathy ([place])    a. s/p MDT CRTD  . Obesity, unspecified   .  Oxygen deficiency 2014   nocturnal;  . Papilloma of breast 03/06/2012   This was diagnosed as a left breast papilloma by ultrasound characteristics and symptoms in 2010. Because of possible medical risk factors no surgical intervention has been done and it did doing annual followups. The area has been stable by physical examination and mammograms and ultrasound since 2010.   . Presence of cardiac pacemaker    Per [facility] Electronic Medical Records System   . Spinal stenosis of lumbar region 02/19/2013  . Spondylolisthesis   . Spondylosis   . Thyroid cancer ([place])    remote thyroidectomy, no recurrence, pt denies in 2016 thyroid cancer  . Type 2 diabetes mellitus with hyperglycemia ([place]) 07/15/2010  . Type 2  diabetes mellitus with hypoglycemia without coma ([place])    Per [facility] Electronic Medical Records [facility]   . Unspecified chronic bronchitis ([place])    Per [facility] Electronic Medical Records [facility]   . Unspecified hypothyroidism       . Urinary tract infection, site not specified    Per [facility] Electronic Medical Records System   . 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 Vertigo         Patient Active Problem List   Diagnosis Date Noted  . Gait abnormality 08/24/2020  . Chronic constipation 08/09/2020  . Acute UTI 08/03/2020  . Hypertensive heart and kidney disease with chronic systolic congestive heart failure and stage 4 chronic kidney disease ([place]) 07/15/2020  . Hyperlipidemia associated with type 2 diabetes mellitus ([place]) 07/15/2020  . Seizure ([place]) 07/07/2020  . Goals of care, counseling/discussion   . Palliative care by specialist   . Idiopathic hypotension   . CHF exacerbation ([place]) 06/29/2020  . Arthritis of right knee 06/20/2020  . Leg swelling 06/16/2020  . Generalized osteoarthritis 05/09/2020  . Primary osteoarthritis, left shoulder 04/21/2020  . Acute systolic CHF (congestive heart failure) ([place]) 03/31/2020  . Bilateral lower extremity edema   . Acute on chronic congestive heart failure ([place]) 03/30/2020  . Right-sided epistaxis 03/28/2020  . Acute pain of left shoulder 01/15/2020  . AAA (abdominal aortic aneurysm) without rupture ([place]) 01/14/2020  . COVID-19 virus detected 11/23/2019  . Stage 3b chronic kidney disease ([place]) 08/04/2019  . Dyspnea 05/30/2019  . Anemia 05/30/2019  . Gout   . Glaucoma   . GERD (gastroesophageal reflux disease)   . Physical debility 04/28/2019  . Encounter for examination following treatment at hospital 11/03/2018  . Hyponatremia 09/27/2018  . H/O mitral valve replacement with mechanical valve 08/02/2018  . Chronic right hip pain 04/16/2018  .  Chronic systolic congestive heart failure ([place]) 11/15/2017  . COPD with acute exacerbation ([place]) 11/15/2017  . Trochanteric bursitis, right hip 03/07/2017  . Persistent atrial fibrillation ([place])   . Nocturnal hypoxia 10/08/2016  . Dependence on nocturnal oxygen therapy 10/08/2016  . Hematuria 03/20/2016  . S/P ICD (internal cardiac defibrillator) procedure 02/08/2015  . Presence of cardiac pacemaker 02/08/2015  . CHB (complete heart block) ([place]) 10/07/2014  . Fatigue 07/20/2014  . Absolute anemia 06/24/2014  . Osteopenia 02/10/2014  . Encounter for therapeutic drug monitoring 10/29/2013  . OA (osteoarthritis) of knee 02/19/2013  . Chronic pain of right knee 01/02/2013  . Hemolytic anemia ([place]) 09/24/2012  . High risk medication use 05/24/2012  . COPD (chronic obstructive pulmonary disease) ([place]) 05/19/2012  . Chronic anticoagulation, on coumadin for Mechanical AVR and MVR 04/01/2012  .  Cardiorenal syndrome with renal failure 03/22/2012  . Hx of aortic valve replacement, mechanical 03/22/2012  . S/P MVR (mitral valve replacement) 03/22/2012  . Biventricular automatic implantable cardioverter defibrillator in situ 03/22/2012  . CKD (chronic kidney disease), stage IV ([place]) 03/22/2012  . Warfarin-induced coagulopathy ([place]) 03/21/2012  . Iron deficiency 10/04/2011  . Allergic rhinitis 02/14/2011  . Mitral valve disease 07/15/2010  . Sinus node dysfunction ([place]) 07/15/2010  . Type 2 diabetes mellitus with hypoglycemia without coma ([place]) 07/15/2010  . VARICOSE VEINS LOWER EXTREMITIES W/OTH COMPS 02/09/2009  . Prediabetes 06/15/2008  . Hypothyroidism, postsurgical 02/24/2008  . Hyperlipidemia LDL goal <70 02/24/2008  . Essential hypertension 02/24/2008  . Non-ischemic cardiomyopathy with ICD 02/24/2008  . Spondylosis 02/24/2008    Past Surgical History:  Procedure Laterality Date  . AORTIC AND MITRAL VALVE REPLACEMENT  2001  . BI-VENTRICULAR IMPLANTABLE CARDIOVERTER DEFIBRILLATOR  UPGRADE N/A 01/18/2015   Procedure: BI-VENTRICULAR IMPLANTABLE CARDIOVERTER DEFIBRILLATOR UPGRADE;  Surgeon: Evans Lance, MD;  Location: [facility] CATH LAB;  Service: Cardiovascular;  Laterality: N/A;  . BIV ICD GENERATOR CHANGEOUT N/A 01/30/2019   Procedure: BIV ICD GENERATOR CHANGEOUT;  Surgeon: Evans Lance, MD;  Location: [place] CV LAB;  Service: Cardiovascular;  Laterality: N/A;  . CARDIAC CATHETERIZATION    . CARDIAC VALVE REPLACEMENT    . CARDIOVERSION N/A 11/13/2016   Procedure: CARDIOVERSION;  Surgeon: Sanda Klein, MD;  Location: [facility] ENDOSCOPY;  Service: Cardiovascular;  Laterality: N/A;  . COLONOSCOPY  06/12/2007   Rourk- Normal rectum/Normal colon  . COLONOSCOPY  12/16/02   small hemorrhoids  . COLONOSCOPY  06/20/2012   Procedure: COLONOSCOPY;  Surgeon: Daneil Dolin, MD;  Location: [facility] ENDO SUITE;  Service: Endoscopy;  Laterality: N/A;  10:45  . defibrillator implanted 2006    . DOPPLER ECHOCARDIOGRAPHY  2012  . DOPPLER ECHOCARDIOGRAPHY  05,06,07,08,09,2011  . ESOPHAGOGASTRODUODENOSCOPY  06/12/2007   Rourk- normal esophagus, small hiatal hernia, otherwise normal stomach, D1, D2  . EYE SURGERY Right 2005  . EYE SURGERY Left 2006  . ICD LEAD REMOVAL Left 02/08/2015   Procedure: ICD LEAD REMOVAL;  Surgeon: Evans Lance, MD;  Location: [facility] OR;  Service: Cardiovascular;  Laterality: Left;  "Will plan extraction and insertion of a BiV PM"  **Dr. Roxan Hockey backing up case**  . IMPLANTABLE CARDIOVERTER DEFIBRILLATOR (ICD) GENERATOR CHANGE Left 02/08/2015   Procedure: ICD GENERATOR CHANGE;  Surgeon: Evans Lance, MD;  Location: [place];  Service: Cardiovascular;  Laterality: Left;  . INSERT / REPLACE / REMOVE PACEMAKER    . PACEMAKER INSERTION  May 2016  . pacemaker placed  2004  . right breast cyst removed     benign   . right cataract removed     2005  . TEE WITHOUT CARDIOVERSION N/A 11/13/2016   Procedure: TRANSESOPHAGEAL ECHOCARDIOGRAM (TEE);  Surgeon: Sanda Klein, MD;   Location: [facility] ENDOSCOPY;  Service: Cardiovascular;  Laterality: N/A;  . THYROIDECTOMY       OB History    Gravida      Para      Term      Preterm      AB      Living  0     SAB      IAB      Ectopic      Multiple      Live Births              Family History  Problem Relation Age of Onset  . Pancreatic cancer Mother   .  Heart disease Father   . Heart disease Sister   . Heart attack Sister   . Heart disease Brother   . Diabetes Brother   . Heart disease Brother   . Diabetes Brother   . Hypertension Brother   . Lung cancer Brother     Social History   Tobacco Use  . Smoking status: Former Smoker    Packs/day: 0.75    Years: 40.00    Pack years: 30.00    Types: Cigarettes    Quit date: 03/08/1987    Years since quitting: 33.8  . Smokeless tobacco: Never Used  Vaping Use  . Vaping Use: Never used  Substance Use Topics  . Alcohol use: No    Alcohol/week: 0.0 standard drinks  . Drug use: No    Home Medications Prior to Admission medications   Medication Sig Start Date End Date Taking? Authorizing Provider  acetaminophen (TYLENOL) 500 MG tablet Take 1 tablet (500 mg total) by mouth every 6 (six) hours as needed (pain). 07/08/20   Oretha Milch D, MD  albuterol (PROVENTIL) (2.5 MG/3ML) 0.083% nebulizer solution Take 3 mLs (2.5 mg total) by nebulization in the morning and at bedtime. *May use additional one time if needed for shortness of breath 08/11/20   Gerlene Fee, NP  albuterol (VENTOLIN HFA) 108 (90 Base) MCG/ACT inhaler Inhale 1 puff into the lungs every 6 (six) hours as needed for wheezing or shortness of breath. 09/16/20   Fayrene Helper, MD  allopurinol (ZYLOPRIM) 100 MG tablet TAKE ONE TABLET BY MOUTH ONCE DAILY. 12/06/20   Fayrene Helper, MD  benzonatate (TESSALON) 100 MG capsule Take 1 capsule (100 mg total) by mouth 2 (two) times daily as needed for cough. 09/22/20   Noreene Larsson, NP  BREO ELLIPTA 100-25 MCG/INH AEPB INHALE  (1) PUFF BY MOUTH INTO THE LUNGS DAILY 12/06/20   Fayrene Helper, MD  cholecalciferol (VITAMIN D3) 25 MCG (1000 UNIT) tablet Take 1 tablet (1,000 Units total) by mouth in the morning. 07/08/20   Desiree Hane, MD  fluticasone (FLONASE) 50 MCG/ACT nasal spray Place 2 sprays into both nostrils daily. 07/08/20   Desiree Hane, MD  folic acid (FOLVITE) 1 MG tablet TAKE ONE TABLET BY MOUTH ONCE DAILY. 12/06/20   Fayrene Helper, MD  furosemide (LASIX) 40 MG tablet Take 1 tablet (40 mg total) by mouth 2 (two) times daily. 11/11/20   Noreene Larsson, NP  gabapentin (NEURONTIN) 300 MG capsule TAKE (1) CAPSULE BY MOUTH TWICE DAILY 12/06/20   Fayrene Helper, MD  hydrALAZINE (APRESOLINE) 25 MG tablet Take 25 mg by mouth 3 (three) times daily.    [provider]  HYDROcodone-homatropine (HYCODAN) 5-1.5 MG/5ML syrup Take 5 mLs by mouth every 8 (eight) hours as needed for cough. 09/28/20   Jacquelin Hawking, NP  levETIRAcetam (KEPPRA) 500 MG tablet TAKE (1) TABLET BY MOUTH TWICE DAILY AT 9AM AND 9PM 12/06/20   Fayrene Helper, MD  levofloxacin (LEVAQUIN) 500 MG tablet Take 1 tablet (500 mg total) by mouth daily. 09/22/20   Noreene Larsson, NP  [facility] PAIN RELIEF PATCH EX) Apply 1 patch topically daily as needed (pain).     [provider]  metoprolol tartrate (LOPRESSOR) 50 MG tablet TAKE (1) TABLET BY MOUTH TWICE DAILY AT 9AM AND 9PM. 12/06/20   Fayrene Helper, MD  nitroGLYCERIN (NITROSTAT) 0.4 MG SL tablet DISSOLVE 1 TABLET SUBLINGUALLY AS NEEDED FOR CHEST PAIN, MAY REPEAT  EVERY 5 MINUTES. AFTER 3 CALL 911. 09/08/20   Fayrene Helper, MD  NON FORMULARY Diet Change: Soft diet with dys 2 (ground) meats, thin liquids (NAS, cons CHO, heart healthy) 07/26/20   [provider]  ondansetron (ZOFRAN) 4 MG tablet Take by mouth at bedtime. 05/27/20   [provider]  ondansetron (ZOFRAN-ODT) 4 MG disintegrating tablet TAKE (1) TABLET BY MOUTH EVERY EIGHT  HOURS AS NEEDED FOR NAUSEA OR VOMITING 12/07/20   Fayrene Helper, MD  OXYGEN Inhale 3 L into the lungs continuous. 07/08/20   [provider]  Polyethyl [name] (SYSTANE) 0.4-0.3 % SOLN Apply 1 drop to eye at bedtime. Left eye    [provider]  polyethylene glycol (MIRALAX / GLYCOLAX) 17 g packet Take 17 g by mouth daily as needed. 07/08/20   Oretha Milch D, MD  potassium chloride SA (KLOR-CON) 20 MEQ tablet Take 1 tablet (20 mEq total) by mouth daily. 11/11/20   Noreene Larsson, NP  pravastatin (PRAVACHOL) 40 MG tablet TAKE ONE TABLET BY MOUTH ONCE DAILY. 12/06/20   Fayrene Helper, MD  senna (SENOKOT) 8.6 MG TABS tablet Take 2 tablets (17.2 mg total) by mouth daily as needed for mild constipation. 07/08/20   Desiree Hane, MD  SYNTHROID 88 MCG tablet TAKE 1 TABLET BEFORE BREAKFAST. 12/06/20   Fayrene Helper, MD  UNABLE TO FIND Compression Stockings R-  Ankle  26.5cm   Calf 38cm  Leg  44cm L-  Ankle  27.5 cm   Calf 39cm  Leg  44cm Patient taking differently: Compression Stockings R-  Ankle  26.5cm   Calf 38cm  Leg  44cm L-  Ankle  27.5 cm   Calf 39cm  Leg  44cm 09/15/20   Fayrene Helper, MD  UNABLE TO FIND 15-19 mmhg compression hose, knee high 11/04/20   Fayrene Helper, MD  warfarin (COUMADIN) 10 MG tablet TAKE (1) TABLET BY MOUTH DAILY AT 4PM 11/23/20   Fayrene Helper, MD    Allergies    Penicillins, Aspirin, Fentanyl, and Niacin  Review of Systems   Review of Systems  All other systems reviewed and are negative.   Physical Exam Updated Vital Signs BP (!) 143/73   Pulse 65   Temp (!) 97.3 F (36.3 C)   Resp 17   Ht 5\' 7"  (1.702 m)   Wt 88.9 kg   SpO2 100%   BMI 30.70 kg/m   Physical Exam Vitals and nursing note reviewed.  Constitutional:      Appearance: She is well-developed.  HENT:     Head: Normocephalic.     Mouth/Throat:     Mouth: Mucous membranes are moist.  Eyes:     Pupils: Pupils are equal, round,  and reactive to light.  Cardiovascular:     Rate and Rhythm: Normal rate and regular rhythm.     Pulses: Normal pulses.  Pulmonary:     Effort: Pulmonary effort is normal.     Breath sounds: Normal breath sounds.  Abdominal:     General: There is no distension.  Musculoskeletal:        General: Normal range of motion.     Cervical back: Normal range of motion.  Skin:    General: Skin is warm.  Neurological:     General: No focal deficit present.     Mental Status: She is alert and oriented to person, place, and time.  Psychiatric:  Mood and Affect: Mood normal.     ED Results / Procedures / Treatments   Labs (all labs ordered are listed, but only abnormal results are displayed) Labs Reviewed  CBC WITH DIFFERENTIAL/PLATELET - Abnormal; Notable for the following components:      Result Value   RBC 3.46 (*)    Hemoglobin 9.7 (*)    HCT 32.3 (*)    RDW 18.3 (*)    Lymphs Abs 0.5 (*)    All other components within normal limits  COMPREHENSIVE METABOLIC PANEL - Abnormal; Notable for the following components:   Chloride 97 (*)    CO2 33 (*)    Glucose, Bld 108 (*)    BUN 50 (*)    Creatinine, Ser 1.42 (*)    Calcium 8.7 (*)    Albumin 3.3 (*)    AST 54 (*)    GFR, Estimated 36 (*)    All other components within normal limits  BRAIN NATRIURETIC PEPTIDE - Abnormal; Notable for the following components:   B Natriuretic Peptide 416.0 (*)    All other components within normal limits  TROPONIN I (HIGH SENSITIVITY) - Abnormal; Notable for the following components:   Troponin I (High Sensitivity) 36 (*)    All other components within normal limits  TROPONIN I (HIGH SENSITIVITY)    EKG None  Radiology DG Chest Port 1 View  Result Date: 12/16/2020 CLINICAL DATA:  Shortness of breath with exertion, bump shoulder ends wall trying to maintain balance, history of respiratory failure, type II diabetes mellitus, CHF, COPD, GERD, non ischemic cardiomyopathy, hypertension  EXAM: PORTABLE CHEST 1 VIEW COMPARISON:  Portable exam 1231 hours compared to 09/10/2020 FINDINGS: LEFT subclavian transvenous ICD with leads projecting over RIGHT atrium, RIGHT ventricle, and coronary sinus. Enlargement of cardiac silhouette post bivalvular replacement. Pulmonary vascular congestion. Atherosclerotic calcification aorta. Peribronchial thickening with chronic accentuation of interstitial markings, may reflect mild chronic failure or chronic interstitial disease. No acute infiltrate, pleural effusion, or pneumothorax. Bones demineralized with advanced LEFT glenohumeral degenerative changes. IMPRESSION: Enlargement of cardiac silhouette with slight pulmonary vascular congestion post bivalvular heart surgery and ICD. Bronchitic changes with chronic accentuation of interstitial markings. No acute abnormalities. Electronically Signed   By: Lavonia Dana M.D.   On: 12/16/2020 13:11   DG Shoulder Left Portable  Result Date: 12/16/2020 CLINICAL DATA:  Left shoulder pain. EXAM: LEFT SHOULDER COMPARISON:  Radiographs 05/05/2004 FINDINGS: Severe and markedly progressive degenerative changes at the glenohumeral joint. Suspect AVN involving the humeral head. The [facility] joint is intact. Mild degenerative changes. No acute bony findings are identified. The visualized left ribs are intact. IMPRESSION: 1. Severe and markedly progressive degenerative changes at the glenohumeral joint. Suspect AVN involving the humeral head. 2. No acute bony findings. Electronically Signed   By: Marijo Sanes M.D.   On: 12/16/2020 13:10    Procedures Procedures   Medications Ordered in ED Medications - No data to display  ED Course  I have reviewed the triage vital signs and the nursing notes.  Pertinent labs & imaging results that were available during my care of the patient were reviewed by me and considered in my medical decision making (see chart for details).    MDM Rules/Calculators/A&P                           MDM:  Shoulder xray shows avascular necrosis, chest xray  No acute,  Pt given lasix 20 mg IV.  Pt  reevaluated.  Pt reports she feels better.  Pt's labs at baseline.  Pt advised lozenges, try not to clear throat.   Final Clinical Impression(s) / ED Diagnoses Final diagnoses:  Laryngitis    Rx / DC Orders ED Discharge Orders    None    An After Visit Summary was printed and given to the patient.    Fransico Meadow, [place] 12/16/20 1637    Milton Ferguson, MD 12/18/20 [phone]

## 2020-12-16 NOTE — ED Triage Notes (Signed)
Pain in left shoulder for weeks, states she fell against a wall weeks ago trying to maintain balance. States she was advised by her home care aide to come in for shortness of breath evaluation yesterday

## 2020-12-17 ENCOUNTER — Telehealth: Payer: Self-pay | Admitting: Nurse Practitioner

## 2020-12-20 DIAGNOSIS — I4819 Other persistent atrial fibrillation: Secondary | ICD-10-CM | POA: Diagnosis not present

## 2020-12-20 DIAGNOSIS — M1711 Unilateral primary osteoarthritis, right knee: Secondary | ICD-10-CM | POA: Diagnosis not present

## 2020-12-20 DIAGNOSIS — E1122 Type 2 diabetes mellitus with diabetic chronic kidney disease: Secondary | ICD-10-CM | POA: Diagnosis not present

## 2020-12-20 DIAGNOSIS — N184 Chronic kidney disease, stage 4 (severe): Secondary | ICD-10-CM | POA: Diagnosis not present

## 2020-12-20 DIAGNOSIS — I5042 Chronic combined systolic (congestive) and diastolic (congestive) heart failure: Secondary | ICD-10-CM | POA: Diagnosis not present

## 2020-12-20 DIAGNOSIS — I130 Hypertensive heart and chronic kidney disease with heart failure and stage 1 through stage 4 chronic kidney disease, or unspecified chronic kidney disease: Secondary | ICD-10-CM | POA: Diagnosis not present

## 2020-12-21 ENCOUNTER — Inpatient Hospital Stay (HOSPITAL_COMMUNITY): Payer: Medicare Other

## 2020-12-21 ENCOUNTER — Ambulatory Visit (HOSPITAL_COMMUNITY): Payer: Medicare Other

## 2020-12-21 ENCOUNTER — Other Ambulatory Visit: Payer: Self-pay

## 2020-12-21 ENCOUNTER — Other Ambulatory Visit (HOSPITAL_COMMUNITY)
Admission: RE | Admit: 2020-12-21 | Discharge: 2020-12-21 | Disposition: A | Discharge status: Home / Self Care | Payer: Medicare Other | Source: Ambulatory Visit | Attending: Nephrology | Admitting: Nephrology

## 2020-12-21 ENCOUNTER — Other Ambulatory Visit (HOSPITAL_COMMUNITY): Payer: Medicare Other

## 2020-12-21 VITALS — BP 135/58 | HR 60 | Temp 97.0°F | Resp 19

## 2020-12-21 DIAGNOSIS — D649 Anemia, unspecified: Secondary | ICD-10-CM

## 2020-12-21 DIAGNOSIS — I129 Hypertensive chronic kidney disease with stage 1 through stage 4 chronic kidney disease, or unspecified chronic kidney disease: Secondary | ICD-10-CM | POA: Insufficient documentation

## 2020-12-21 DIAGNOSIS — N183 Chronic kidney disease, stage 3 unspecified: Secondary | ICD-10-CM | POA: Diagnosis not present

## 2020-12-21 DIAGNOSIS — N184 Chronic kidney disease, stage 4 (severe): Secondary | ICD-10-CM

## 2020-12-21 DIAGNOSIS — N189 Chronic kidney disease, unspecified: Secondary | ICD-10-CM | POA: Insufficient documentation

## 2020-12-21 DIAGNOSIS — D631 Anemia in chronic kidney disease: Secondary | ICD-10-CM | POA: Insufficient documentation

## 2020-12-21 DIAGNOSIS — I5043 Acute on chronic combined systolic (congestive) and diastolic (congestive) heart failure: Secondary | ICD-10-CM | POA: Insufficient documentation

## 2020-12-21 DIAGNOSIS — E611 Iron deficiency: Secondary | ICD-10-CM

## 2020-12-21 LAB — CBC WITH DIFFERENTIAL/PLATELET
Abs Immature Granulocytes: 0.01 10*3/uL (ref 0.00–0.07)
Basophils Absolute: 0 10*3/uL (ref 0.0–0.1)
Basophils Relative: 0 %
Eosinophils Absolute: 0 10*3/uL (ref 0.0–0.5)
Eosinophils Relative: 0 %
HCT: 33.4 % — ABNORMAL LOW (ref 36.0–46.0)
Hemoglobin: 9.9 g/dL — ABNORMAL LOW (ref 12.0–15.0)
Immature Granulocytes: 0 %
Lymphocytes Relative: 19 %
Lymphs Abs: 0.8 10*3/uL (ref 0.7–4.0)
MCH: 27.4 pg (ref 26.0–34.0)
MCHC: 29.6 g/dL — ABNORMAL LOW (ref 30.0–36.0)
MCV: 92.5 fL (ref 80.0–100.0)
Monocytes Absolute: 0.3 10*3/uL (ref 0.1–1.0)
Monocytes Relative: 7 %
Neutro Abs: 3.2 10*3/uL (ref 1.7–7.7)
Neutrophils Relative %: 74 %
Platelets: 169 10*3/uL (ref 150–400)
RBC: 3.61 MIL/uL — ABNORMAL LOW (ref 3.87–5.11)
RDW: 17.8 % — ABNORMAL HIGH (ref 11.5–15.5)
WBC: 4.3 10*3/uL (ref 4.0–10.5)
nRBC: 0 % (ref 0.0–0.2)

## 2020-12-21 LAB — CBC
HCT: 33.7 % — ABNORMAL LOW (ref 36.0–46.0)
Hemoglobin: 9.9 g/dL — ABNORMAL LOW (ref 12.0–15.0)
MCH: 27 pg (ref 26.0–34.0)
MCHC: 29.4 g/dL — ABNORMAL LOW (ref 30.0–36.0)
MCV: 92.1 fL (ref 80.0–100.0)
Platelets: 172 10*3/uL (ref 150–400)
RBC: 3.66 MIL/uL — ABNORMAL LOW (ref 3.87–5.11)
RDW: 17.9 % — ABNORMAL HIGH (ref 11.5–15.5)
WBC: 4.3 10*3/uL (ref 4.0–10.5)
nRBC: 0 % (ref 0.0–0.2)

## 2020-12-21 LAB — HEPATIC FUNCTION PANEL
ALT: 31 U/L (ref 0–44)
AST: 47 U/L — ABNORMAL HIGH (ref 15–41)
Albumin: 3.5 g/dL (ref 3.5–5.0)
Alkaline Phosphatase: 49 U/L (ref 38–126)
Bilirubin, Direct: 0.1 mg/dL (ref 0.0–0.2)
Indirect Bilirubin: 0.4 mg/dL (ref 0.3–0.9)
Total Bilirubin: 0.5 mg/dL (ref 0.3–1.2)
Total Protein: 7.4 g/dL (ref 6.5–8.1)

## 2020-12-21 MED ORDER — EPOETIN ALFA-EPBX 40000 UNIT/ML IJ SOLN
40000.0000 [IU] | Freq: Once | INTRAMUSCULAR | Status: AC
  Administered 2020-12-21: 40000 [IU] via SUBCUTANEOUS
  Filled 2020-12-21: qty 1

## 2020-12-21 NOTE — Progress Notes (Signed)
Patient tolerated Retacrit 40,000 U injection with no complaints voiced.  Site clean and dry with no bruising or swelling noted.  No complaints of pain.  Discharged with vital signs stable and no signs or symptoms of distress noted.  

## 2020-12-22 ENCOUNTER — Telehealth: Payer: Self-pay

## 2020-12-22 ENCOUNTER — Other Ambulatory Visit: Payer: Self-pay | Admitting: Family Medicine

## 2020-12-22 DIAGNOSIS — I4819 Other persistent atrial fibrillation: Secondary | ICD-10-CM | POA: Diagnosis not present

## 2020-12-22 DIAGNOSIS — N184 Chronic kidney disease, stage 4 (severe): Secondary | ICD-10-CM | POA: Diagnosis not present

## 2020-12-22 DIAGNOSIS — E1122 Type 2 diabetes mellitus with diabetic chronic kidney disease: Secondary | ICD-10-CM | POA: Diagnosis not present

## 2020-12-22 DIAGNOSIS — I130 Hypertensive heart and chronic kidney disease with heart failure and stage 1 through stage 4 chronic kidney disease, or unspecified chronic kidney disease: Secondary | ICD-10-CM | POA: Diagnosis not present

## 2020-12-22 DIAGNOSIS — I5042 Chronic combined systolic (congestive) and diastolic (congestive) heart failure: Secondary | ICD-10-CM | POA: Diagnosis not present

## 2020-12-22 DIAGNOSIS — M1711 Unilateral primary osteoarthritis, right knee: Secondary | ICD-10-CM | POA: Diagnosis not present

## 2020-12-22 NOTE — Telephone Encounter (Signed)
Annette informed 

## 2020-12-22 NOTE — Telephone Encounter (Signed)
Verbal given 

## 2020-12-22 NOTE — Telephone Encounter (Signed)
Needs verbal ,   For Med management and something else

## 2020-12-23 ENCOUNTER — Other Ambulatory Visit: Payer: Self-pay

## 2020-12-23 ENCOUNTER — Ambulatory Visit (INDEPENDENT_AMBULATORY_CARE_PROVIDER_SITE_OTHER): Payer: Medicare Other | Admitting: *Deleted

## 2020-12-23 DIAGNOSIS — I4819 Other persistent atrial fibrillation: Secondary | ICD-10-CM | POA: Diagnosis not present

## 2020-12-23 DIAGNOSIS — Z5181 Encounter for therapeutic drug level monitoring: Secondary | ICD-10-CM | POA: Diagnosis not present

## 2020-12-23 LAB — POCT INR: INR: 2 (ref 2.0–3.0)

## 2020-12-23 NOTE — Patient Instructions (Signed)
Continue warfarin 1 tablet  (10mg ) daily except for 1/2 tablet (5mg )  on Monday and Friday. Recheck in 3 weeks in the office

## 2020-12-24 DIAGNOSIS — N1832 Chronic kidney disease, stage 3b: Secondary | ICD-10-CM | POA: Diagnosis not present

## 2020-12-24 DIAGNOSIS — E1122 Type 2 diabetes mellitus with diabetic chronic kidney disease: Secondary | ICD-10-CM | POA: Diagnosis not present

## 2020-12-24 DIAGNOSIS — D631 Anemia in chronic kidney disease: Secondary | ICD-10-CM | POA: Diagnosis not present

## 2020-12-24 DIAGNOSIS — N189 Chronic kidney disease, unspecified: Secondary | ICD-10-CM | POA: Diagnosis not present

## 2020-12-24 DIAGNOSIS — I130 Hypertensive heart and chronic kidney disease with heart failure and stage 1 through stage 4 chronic kidney disease, or unspecified chronic kidney disease: Secondary | ICD-10-CM | POA: Diagnosis not present

## 2020-12-24 DIAGNOSIS — I5043 Acute on chronic combined systolic (congestive) and diastolic (congestive) heart failure: Secondary | ICD-10-CM | POA: Diagnosis not present

## 2020-12-24 DIAGNOSIS — I129 Hypertensive chronic kidney disease with stage 1 through stage 4 chronic kidney disease, or unspecified chronic kidney disease: Secondary | ICD-10-CM | POA: Diagnosis not present

## 2020-12-24 DIAGNOSIS — I4819 Other persistent atrial fibrillation: Secondary | ICD-10-CM | POA: Diagnosis not present

## 2020-12-24 DIAGNOSIS — I5042 Chronic combined systolic (congestive) and diastolic (congestive) heart failure: Secondary | ICD-10-CM | POA: Diagnosis not present

## 2020-12-24 DIAGNOSIS — M1711 Unilateral primary osteoarthritis, right knee: Secondary | ICD-10-CM | POA: Diagnosis not present

## 2020-12-24 DIAGNOSIS — N184 Chronic kidney disease, stage 4 (severe): Secondary | ICD-10-CM | POA: Diagnosis not present

## 2020-12-27 ENCOUNTER — Ambulatory Visit (INDEPENDENT_AMBULATORY_CARE_PROVIDER_SITE_OTHER): Payer: Medicare Other

## 2020-12-27 DIAGNOSIS — I4819 Other persistent atrial fibrillation: Secondary | ICD-10-CM | POA: Diagnosis not present

## 2020-12-27 DIAGNOSIS — E11649 Type 2 diabetes mellitus with hypoglycemia without coma: Secondary | ICD-10-CM | POA: Diagnosis not present

## 2020-12-27 DIAGNOSIS — I5042 Chronic combined systolic (congestive) and diastolic (congestive) heart failure: Secondary | ICD-10-CM

## 2020-12-27 DIAGNOSIS — N184 Chronic kidney disease, stage 4 (severe): Secondary | ICD-10-CM | POA: Diagnosis not present

## 2020-12-27 DIAGNOSIS — Z950 Presence of cardiac pacemaker: Secondary | ICD-10-CM | POA: Diagnosis not present

## 2020-12-27 DIAGNOSIS — I950 Idiopathic hypotension: Secondary | ICD-10-CM | POA: Diagnosis not present

## 2020-12-27 DIAGNOSIS — I130 Hypertensive heart and chronic kidney disease with heart failure and stage 1 through stage 4 chronic kidney disease, or unspecified chronic kidney disease: Secondary | ICD-10-CM | POA: Diagnosis not present

## 2020-12-27 DIAGNOSIS — I429 Cardiomyopathy, unspecified: Secondary | ICD-10-CM | POA: Diagnosis not present

## 2020-12-27 DIAGNOSIS — M1711 Unilateral primary osteoarthritis, right knee: Secondary | ICD-10-CM | POA: Diagnosis not present

## 2020-12-27 DIAGNOSIS — R569 Unspecified convulsions: Secondary | ICD-10-CM | POA: Diagnosis not present

## 2020-12-27 DIAGNOSIS — J449 Chronic obstructive pulmonary disease, unspecified: Secondary | ICD-10-CM | POA: Diagnosis not present

## 2020-12-27 DIAGNOSIS — J9611 Chronic respiratory failure with hypoxia: Secondary | ICD-10-CM | POA: Diagnosis not present

## 2020-12-27 DIAGNOSIS — E1122 Type 2 diabetes mellitus with diabetic chronic kidney disease: Secondary | ICD-10-CM | POA: Diagnosis not present

## 2020-12-28 ENCOUNTER — Telehealth: Payer: Self-pay

## 2020-12-28 NOTE — Progress Notes (Signed)
EPIC Encounter for ICM Monitoring  Patient Name: [name] [name] is a 87 y.o. female Date: 12/28/2020 Primary Care Physican: Fayrene Helper, MD Primary Cardiologist:Croitoru Electrophysiologist:Taylor Bi-V Pacing:99.9% 11/24/2020 Weight: 196lbs     Attempted call to patient and unable to reach.  Transmission reviewed.     Optivol thoracic impedancesuggesting normal fluid levels.  Prescribed:  Furosemide40 mg1tablet (40 mg total)by mouth twice a day.   Potassium 20 mEq take 1 tablet daily.  Labs: 11/23/2020 Creatinine 2.08, BUN 90, Potassium 4.7, Sodium 139, GFR 23 10/21/2020 Creatinine 1.70, BUN 56, Potassium 4.1, Sodium 136, GFR 29  10/07/2020 Creatinine 1.87, BUN 67, Potassium 4.5, Sodium 138, GFR 26  09/28/2020 Creatinine1.65, BUN31, Potassium4.1, NOTRRN165, GFR30  09/23/2020 Creatinine1.51, BUN54, Potassium5.8, Sodium141, GFR33  09/10/2020 Creatinine1.46, BUN51, Potassium5.4, Sodium140, GFR35 A complete set of results can be found in Results Review.  Recommendations:  Unable to reach.    Follow-up plan: ICM clinic phone appointment on5/01/2021.91 day device clinic remote transmission5/20/2022.  EP/Cardiology Office Visits:01/11/2021 with Dr Lovena Le.  5/2/2022with Dr Sallyanne Kuster.   Copy of ICM check sent to Dr.Taylor.  3 month ICM trend: 12/27/2020.    1 Year ICM trend:       [name], RN 12/28/2020 12:11 PM

## 2020-12-28 NOTE — Telephone Encounter (Signed)
Remote ICM transmission received.  Attempted call to patient regarding ICM remote transmission and no answer or voice mail option.  

## 2020-12-29 ENCOUNTER — Other Ambulatory Visit: Payer: Self-pay | Admitting: Family Medicine

## 2020-12-29 DIAGNOSIS — I130 Hypertensive heart and chronic kidney disease with heart failure and stage 1 through stage 4 chronic kidney disease, or unspecified chronic kidney disease: Secondary | ICD-10-CM | POA: Diagnosis not present

## 2020-12-29 DIAGNOSIS — M1711 Unilateral primary osteoarthritis, right knee: Secondary | ICD-10-CM | POA: Diagnosis not present

## 2020-12-29 DIAGNOSIS — I5042 Chronic combined systolic (congestive) and diastolic (congestive) heart failure: Secondary | ICD-10-CM | POA: Diagnosis not present

## 2020-12-29 DIAGNOSIS — I4819 Other persistent atrial fibrillation: Secondary | ICD-10-CM | POA: Diagnosis not present

## 2020-12-29 DIAGNOSIS — N184 Chronic kidney disease, stage 4 (severe): Secondary | ICD-10-CM | POA: Diagnosis not present

## 2020-12-29 DIAGNOSIS — E1122 Type 2 diabetes mellitus with diabetic chronic kidney disease: Secondary | ICD-10-CM | POA: Diagnosis not present

## 2020-12-30 ENCOUNTER — Ambulatory Visit (INDEPENDENT_AMBULATORY_CARE_PROVIDER_SITE_OTHER): Payer: Medicare Other | Admitting: *Deleted

## 2020-12-30 DIAGNOSIS — I5022 Chronic systolic (congestive) heart failure: Secondary | ICD-10-CM

## 2020-12-30 DIAGNOSIS — I10 Essential (primary) hypertension: Secondary | ICD-10-CM

## 2020-12-30 NOTE — Chronic Care Management (AMB) (Signed)
Chronic Care Management   CCM RN Visit Note  12/30/2020 Name: [name] [name] MRN: [id number] DOB: 03/25/1932  Subjective: [name] [name] is a 87 y.o. year old female who is a primary care patient of Fayrene Helper, MD. The care management team was consulted for assistance with disease management and care coordination needs.    Engaged with patient by telephone for follow up visit in response to provider referral for case management and/or care coordination services.   Consent to Services: Patient agreed to services and verbal consent obtained.   Assessment: Review of patient past medical history, allergies, medications, health status, including review of consultants reports, laboratory and other test data, was performed as part of comprehensive evaluation and provision of chronic care management services.   SDOH (Social Determinants of Health) assessments and interventions performed:    CCM Care Plan  Allergies  Allergen Reactions  . Penicillins Other (See Comments)    Bruise    . Aspirin Other (See Comments)    On coumadin  . Fentanyl Dermatitis    Rash with patch   . Niacin Itching    Outpatient Encounter Medications as of 12/30/2020  Medication Sig Note  . acetaminophen (TYLENOL) 500 MG tablet Take 1 tablet (500 mg total) by mouth every 6 (six) hours as needed (pain).   Marland Kitchen albuterol (PROVENTIL) (2.5 MG/3ML) 0.083% nebulizer solution Take 3 mLs (2.5 mg total) by nebulization in the morning and at bedtime. *May use additional one time if needed for shortness of breath   . albuterol (VENTOLIN HFA) 108 (90 Base) MCG/ACT inhaler Inhale 1 puff into the lungs every 6 (six) hours as needed for wheezing or shortness of breath.   . allopurinol (ZYLOPRIM) 100 MG tablet TAKE ONE TABLET BY MOUTH ONCE DAILY.   . benzonatate (TESSALON) 100 MG capsule Take 1 capsule (100 mg total) by mouth 2 (two) times daily as needed for cough. 09/22/2020: Reports she has confirmed that her outpatient  pharmacy will deliver to her today  . BREO ELLIPTA 100-25 MCG/INH AEPB INHALE (1) PUFF BY MOUTH INTO THE LUNGS DAILY   . cholecalciferol (VITAMIN D3) 25 MCG (1000 UNIT) tablet Take 1 tablet (1,000 Units total) by mouth in the morning.   . fluticasone (FLONASE) 50 MCG/ACT nasal spray Place 2 sprays into both nostrils daily.   . folic acid (FOLVITE) 1 MG tablet TAKE ONE TABLET BY MOUTH ONCE DAILY.   . furosemide (LASIX) 40 MG tablet Take 1 tablet (40 mg total) by mouth 2 (two) times daily.   Marland Kitchen gabapentin (NEURONTIN) 300 MG capsule TAKE (1) CAPSULE BY MOUTH TWICE DAILY   . hydrALAZINE (APRESOLINE) 25 MG tablet Take 25 mg by mouth 3 (three) times daily.   Marland Kitchen HYDROcodone-homatropine (HYCODAN) 5-1.5 MG/5ML syrup Take 5 mLs by mouth every 8 (eight) hours as needed for cough.   . levETIRAcetam (KEPPRA) 500 MG tablet TAKE (1) TABLET BY MOUTH TWICE DAILY AT 9AM AND 9PM   . levofloxacin (LEVAQUIN) 500 MG tablet Take 1 tablet (500 mg total) by mouth daily. 09/22/2020: Reports that she has confirmed that her outpatient pharmacy will deliver to her today  . Liniments (SALONPAS PAIN RELIEF PATCH EX) Apply 1 patch topically daily as needed (pain).    . metoprolol tartrate (LOPRESSOR) 50 MG tablet TAKE (1) TABLET BY MOUTH TWICE DAILY AT 9AM AND 9PM.   . nitroGLYCERIN (NITROSTAT) 0.4 MG SL tablet DISSOLVE 1 TABLET SUBLINGUALLY AS NEEDED FOR CHEST PAIN, MAY REPEAT EVERY 5 MINUTES. AFTER 3 CALL 911.   Marland Kitchen  NON FORMULARY Diet Change: Soft diet with dys 2 (ground) meats, thin liquids (NAS, cons CHO, heart healthy)   . ondansetron (ZOFRAN) 4 MG tablet Take by mouth at bedtime.   . ondansetron (ZOFRAN-ODT) 4 MG disintegrating tablet TAKE (1) TABLET BY MOUTH EVERY EIGHT HOURS AS NEEDED FOR NAUSEA OR VOMITING   . OXYGEN Inhale 3 L into the lungs continuous.   [name] [name] (SYSTANE) 0.4-0.3 % SOLN Apply 1 drop to eye at bedtime. Left eye   . polyethylene glycol (MIRALAX / GLYCOLAX) 17 g packet Take 17 g by  mouth daily as needed.   . potassium chloride SA (KLOR-CON) 20 MEQ tablet Take 1 tablet (20 mEq total) by mouth daily.   . pravastatin (PRAVACHOL) 40 MG tablet TAKE ONE TABLET BY MOUTH ONCE DAILY.   Marland Kitchen senna (SENOKOT) 8.6 MG TABS tablet Take 2 tablets (17.2 mg total) by mouth daily as needed for mild constipation.   Marland Kitchen SYNTHROID 88 MCG tablet TAKE 1 TABLET BEFORE BREAKFAST.   Marland Kitchen UNABLE TO FIND Compression Stockings R-  Ankle  26.5cm   Calf 38cm  Leg  44cm L-  Ankle  27.5 cm   Calf 39cm  Leg  44cm (Patient taking differently: Compression Stockings R-  Ankle  26.5cm   Calf 38cm  Leg  44cm L-  Ankle  27.5 cm   Calf 39cm  Leg  44cm)   . UNABLE TO FIND 15-19 mmhg compression hose, knee high   . warfarin (COUMADIN) 10 MG tablet TAKE (1) TABLET BY MOUTH DAILY AT 4PM    Facility-Administered Encounter Medications as of 12/30/2020  Medication  . epoetin alfa (EPOGEN,PROCRIT) injection 40,000 Units    Patient Active Problem List   Diagnosis Date Noted  . Gait abnormality 08/24/2020  . Chronic constipation 08/09/2020  . Acute UTI 08/03/2020  . Hypertensive heart and kidney disease with chronic systolic congestive heart failure and stage 4 chronic kidney disease ([place]) 07/15/2020  . Hyperlipidemia associated with type 2 diabetes mellitus ([place]) 07/15/2020  . Seizure ([place]) 07/07/2020  . Goals of care, counseling/discussion   . Palliative care by specialist   . Idiopathic hypotension   . CHF exacerbation ([place]) 06/29/2020  . Arthritis of right knee 06/20/2020  . Leg swelling 06/16/2020  . Generalized osteoarthritis 05/09/2020  . Primary osteoarthritis, left shoulder 04/21/2020  . Acute systolic CHF (congestive heart failure) ([place]) 03/31/2020  . Bilateral lower extremity edema   . Acute on chronic congestive heart failure ([place]) 03/30/2020  . Right-sided epistaxis 03/28/2020  . Acute pain of left shoulder 01/15/2020  . AAA (abdominal aortic aneurysm) without rupture ([place]) 01/14/2020  . COVID-19 virus  detected 11/23/2019  . Stage 3b chronic kidney disease ([place]) 08/04/2019  . Dyspnea 05/30/2019  . Anemia 05/30/2019  . Gout   . Glaucoma   . GERD (gastroesophageal reflux disease)   . Physical debility 04/28/2019  . Encounter for examination following treatment at hospital 11/03/2018  . Hyponatremia 09/27/2018  . H/O mitral valve replacement with mechanical valve 08/02/2018  . Chronic right hip pain 04/16/2018  . Chronic systolic congestive heart failure ([place]) 11/15/2017  . COPD with acute exacerbation ([place]) 11/15/2017  . Trochanteric bursitis, right hip 03/07/2017  . Persistent atrial fibrillation ([place])   . Nocturnal hypoxia 10/08/2016  . Dependence on nocturnal oxygen therapy 10/08/2016  . Hematuria 03/20/2016  . S/P ICD (internal cardiac defibrillator) procedure 02/08/2015  . Presence of cardiac pacemaker 02/08/2015  . CHB (complete heart block) ([place]) 10/07/2014  . Fatigue  07/20/2014  . Absolute anemia 06/24/2014  . Osteopenia 02/10/2014  . Encounter for therapeutic drug monitoring 10/29/2013  . OA (osteoarthritis) of knee 02/19/2013  . Chronic pain of right knee 01/02/2013  . Hemolytic anemia ([place]) 09/24/2012  . High risk medication use 05/24/2012  . COPD (chronic obstructive pulmonary disease) ([place]) 05/19/2012  . Chronic anticoagulation, on coumadin for Mechanical AVR and MVR 04/01/2012  . Cardiorenal syndrome with renal failure 03/22/2012  . Hx of aortic valve replacement, mechanical 03/22/2012  . S/P MVR (mitral valve replacement) 03/22/2012  . Biventricular automatic implantable cardioverter defibrillator in situ 03/22/2012  . CKD (chronic kidney disease), stage IV ([place]) 03/22/2012  . Warfarin-induced coagulopathy ([place]) 03/21/2012  . Iron deficiency 10/04/2011  . Allergic rhinitis 02/14/2011  . Mitral valve disease 07/15/2010  . Sinus node dysfunction ([place]) 07/15/2010  . Type 2 diabetes mellitus with hypoglycemia without coma ([place]) 07/15/2010  . VARICOSE VEINS LOWER  EXTREMITIES W/OTH COMPS 02/09/2009  . Prediabetes 06/15/2008  . Hypothyroidism, postsurgical 02/24/2008  . Hyperlipidemia LDL goal <70 02/24/2008  . Essential hypertension 02/24/2008  . Non-ischemic cardiomyopathy with ICD 02/24/2008  . Spondylosis 02/24/2008    Conditions to be addressed/monitored:CHF and HTN  Care Plan : Heart Failure (Adult)  Updates made by Kassie Mends, RN since 12/30/2020 12:00 AM    Problem: Symptom Exacerbation (Heart Failure)     Long-Range Goal: Symptom Exacerbation Prevented or Minimized   Start Date: 10/29/2020  Expected End Date: 04/28/2021  This Visit's Progress: On track  Recent Progress: On track  Priority: High  Note:   Current Barriers:  Marland Kitchen Knowledge deficits related to basic heart failure pathophysiology and self care management as evidenced by patient weighing every other day instead of daily (patient continues to weigh QOD), patient is calling to get furosemide refilled ahead of time. . CNA assists patient to apply compression hose each morning as patient cannot independently perform this task Nurse Case Manager Clinical Goal(s):   Over the next 30 days, patient will weigh self daily and record  Over the next 90 days, patient will verbalize understanding of Heart Failure Action Plan and when to call doctor Interventions:  . Collaboration with Fayrene Helper, MD regarding development and update of comprehensive plan of care as evidenced by provider attestation and co-signature . Inter-disciplinary care team collaboration (see longitudinal plan of care) . Provided verbal education on low sodium diet . Reinforced Heart Failure Action Plan and importance of knowing how you feel each day . Discussed importance of daily weight and recording . Reviewed role of diuretics in prevention of fluid overload and importance of taking as prescribed . Reviewed with patient that patient does have compression hose and is wearing daily . Reviewed medications  with patient and encouraged compliance . Reviewed upcoming appointments with patient- Pacer check on 01/11/21, Dr. Sallyanne Kuster (cardiology) on 01/24/21.  Patient saw nephrologist on 12/24/20. Marland Kitchen Continue to work with home health physical therapy to reach goals for increased endurance, safe ambulation. Patient Goals/Self-Care Activities . follow heart failure action plan if symptoms flare up . Continue weighing and recording, ask CNA to help you . know when to call the doctor- call for weight gain of 3 pounds in one day or 5 pounds in one week. . Call [facility] for any questions at [phone] . Wear compression hose as directed, take them off at night . Take medications as prescribed and call for refills before running out  Follow Up Plan: Telephone follow up appointment with care  management team member scheduled for: 02/24/2021     Care Plan : Hypertension (Adult)  Updates made by Kassie Mends, RN since 12/30/2020 12:00 AM    Problem: Hypertension (Hypertension)   Priority: Medium    Long-Range Goal: Hypertension Monitored   Start Date: 10/29/2020  Expected End Date: 04/28/2021  This Visit's Progress: On track  Recent Progress: On track  Priority: Medium  Note:   Objective:  . Last practice recorded BP readings:  BP Readings from Last 3 Encounters:  10/27/20 (!) 153/77  10/26/20 (!) 145/75  09/28/20 133/72   . Most recent eGFR/CrCl: No results found for: EGFR  No components found for: CRCL Current Barriers:  Marland Kitchen Knowledge Deficits related to basic understanding of hypertension pathophysiology and self care management as evidenced by continued reports of elevated blood pressures . Unable to perform ADLs independently- has [facility] home care aide that assists with checking blood pressure several times weekly Case Manager Clinical Goal(s):  Marland Kitchen Over the next 60 days, patient will demonstrate improved adherence to prescribed treatment plan for hypertension as evidenced by taking all medications  as prescribed, monitoring and recording blood pressure as directed, adhering to low sodium/DASH diet Interventions:  . Collaboration with Fayrene Helper, MD regarding development and update of comprehensive plan of care as evidenced by provider attestation and co-signature . Inter-disciplinary care team collaboration (see longitudinal plan of care) . Reviewed medications with patient and discussed importance of compliance . Reinforced with patient, providing education and rationale, to monitor blood pressure at least 3 times per week and record (with assistance of CNA), calling PCP for findings outside established parameters.  . Reinforced low salt diet and importance of adherence Patient Goals/Self-Care Activities . Continue checking blood pressure at least 3 times per week with the assistance of [facility] home aide . write blood pressure results in a log or diary . Call primary care doctor for any readings outside of normal limits  . follow a low salt diet- avoid fast food, salty chips, fries, etc. . Continue to follow up with your doctor  . Take all medications as prescribed Follow Up Plan: Telephone follow up appointment with care management team member scheduled for: 02/24/2021     Plan:Telephone follow up appointment with care management team member scheduled for:  02/24/2021  Jacqlyn Larsen [facility], BSN RN Case Manager [facility] [phone]

## 2020-12-30 NOTE — Patient Instructions (Signed)
Visit Information  PATIENT GOALS: Goals Addressed            This Visit's Progress   . Track and Manage My Blood Pressure-Hypertension       Timeframe:  Long-Range Goal Priority:  Medium Start Date:      10/29/2020                       Expected End Date:  04/28/2021                     Follow Up Date 02/24/2021   . Continue checking blood pressure at least 3 times per week with the assistance of [facility] home aide . write blood pressure results in a log or diary . Call primary care doctor for any readings outside of normal limits  . follow a low salt diet- avoid fast food, salty chips, fries, etc. . Continue to follow up with your doctor  . Take all medications as prescribed   Why is this important?    You won't feel high blood pressure, but it can still hurt your blood vessels.   High blood pressure can cause heart or kidney problems. It can also cause a stroke.   Making lifestyle changes like losing a little weight or eating less salt will help.   Checking your blood pressure at home and at different times of the day can help to control blood pressure.   If the doctor prescribes medicine remember to take it the way the doctor ordered.   Call the office if you cannot afford the medicine or if there are questions about it.        . Track and Manage Symptoms-Heart Failure       Timeframe:  Long-Range Goal Priority:  High Start Date:       10/29/2020                      Expected End Date:   04/28/2021                    Follow Up Date 02/24/2021   . follow heart failure action plan if symptoms flare up . Continue weighing and recording, ask CNA to help you . know when to call the doctor- call for weight gain of 3 pounds in one day or 5 pounds in one week. . Call [facility] for any questions at [phone] . Wear compression hose as directed, take them off at night . Take medications as prescribed and call for refills before running out   Why is this important?    You will  be able to handle your symptoms better if you keep track of them.   Making some simple changes to your lifestyle will help.   Eating healthy is one thing you can do to take good care of yourself.           The patient verbalized understanding of instructions, educational materials, and care plan provided today and declined offer to receive copy of patient instructions, educational materials, and care plan.   Telephone follow up appointment with care management team member scheduled for: 02/24/2021  Jacqlyn Larsen [facility], BSN RN Case Manager [facility] [phone]

## 2020-12-31 ENCOUNTER — Emergency Department (HOSPITAL_COMMUNITY)
Admission: EM | Admit: 2020-12-31 | Discharge: 2020-12-31 | Disposition: A | Discharge status: Home / Self Care | Payer: Medicare Other | Attending: Emergency Medicine | Admitting: Emergency Medicine

## 2020-12-31 ENCOUNTER — Encounter (HOSPITAL_COMMUNITY): Payer: Self-pay | Admitting: *Deleted

## 2020-12-31 ENCOUNTER — Other Ambulatory Visit: Payer: Self-pay

## 2020-12-31 ENCOUNTER — Emergency Department (HOSPITAL_COMMUNITY): Payer: Medicare Other

## 2020-12-31 DIAGNOSIS — R2242 Localized swelling, mass and lump, left lower limb: Secondary | ICD-10-CM | POA: Diagnosis not present

## 2020-12-31 DIAGNOSIS — Z8616 Personal history of COVID-19: Secondary | ICD-10-CM | POA: Insufficient documentation

## 2020-12-31 DIAGNOSIS — E039 Hypothyroidism, unspecified: Secondary | ICD-10-CM | POA: Diagnosis not present

## 2020-12-31 DIAGNOSIS — Z87891 Personal history of nicotine dependence: Secondary | ICD-10-CM | POA: Diagnosis not present

## 2020-12-31 DIAGNOSIS — R0602 Shortness of breath: Secondary | ICD-10-CM | POA: Diagnosis not present

## 2020-12-31 DIAGNOSIS — F0390 Unspecified dementia without behavioral disturbance: Secondary | ICD-10-CM | POA: Diagnosis not present

## 2020-12-31 DIAGNOSIS — Z7901 Long term (current) use of anticoagulants: Secondary | ICD-10-CM | POA: Insufficient documentation

## 2020-12-31 DIAGNOSIS — I509 Heart failure, unspecified: Secondary | ICD-10-CM | POA: Diagnosis not present

## 2020-12-31 DIAGNOSIS — J449 Chronic obstructive pulmonary disease, unspecified: Secondary | ICD-10-CM | POA: Insufficient documentation

## 2020-12-31 DIAGNOSIS — I5033 Acute on chronic diastolic (congestive) heart failure: Secondary | ICD-10-CM | POA: Insufficient documentation

## 2020-12-31 DIAGNOSIS — I517 Cardiomegaly: Secondary | ICD-10-CM | POA: Diagnosis not present

## 2020-12-31 DIAGNOSIS — Z954 Presence of other heart-valve replacement: Secondary | ICD-10-CM | POA: Diagnosis not present

## 2020-12-31 DIAGNOSIS — Z79899 Other long term (current) drug therapy: Secondary | ICD-10-CM | POA: Insufficient documentation

## 2020-12-31 DIAGNOSIS — R7989 Other specified abnormal findings of blood chemistry: Secondary | ICD-10-CM | POA: Diagnosis not present

## 2020-12-31 DIAGNOSIS — E1122 Type 2 diabetes mellitus with diabetic chronic kidney disease: Secondary | ICD-10-CM | POA: Diagnosis not present

## 2020-12-31 DIAGNOSIS — M7989 Other specified soft tissue disorders: Secondary | ICD-10-CM | POA: Diagnosis not present

## 2020-12-31 DIAGNOSIS — I10 Essential (primary) hypertension: Secondary | ICD-10-CM | POA: Diagnosis not present

## 2020-12-31 DIAGNOSIS — I130 Hypertensive heart and chronic kidney disease with heart failure and stage 1 through stage 4 chronic kidney disease, or unspecified chronic kidney disease: Secondary | ICD-10-CM | POA: Diagnosis not present

## 2020-12-31 DIAGNOSIS — N184 Chronic kidney disease, stage 4 (severe): Secondary | ICD-10-CM | POA: Diagnosis not present

## 2020-12-31 DIAGNOSIS — Z86000 Personal history of in-situ neoplasm of breast: Secondary | ICD-10-CM | POA: Diagnosis not present

## 2020-12-31 DIAGNOSIS — Z85850 Personal history of malignant neoplasm of thyroid: Secondary | ICD-10-CM | POA: Insufficient documentation

## 2020-12-31 DIAGNOSIS — R600 Localized edema: Secondary | ICD-10-CM | POA: Diagnosis not present

## 2020-12-31 DIAGNOSIS — Z95810 Presence of automatic (implantable) cardiac defibrillator: Secondary | ICD-10-CM | POA: Insufficient documentation

## 2020-12-31 DIAGNOSIS — I5032 Chronic diastolic (congestive) heart failure: Secondary | ICD-10-CM | POA: Diagnosis not present

## 2020-12-31 DIAGNOSIS — I110 Hypertensive heart disease with heart failure: Secondary | ICD-10-CM | POA: Diagnosis not present

## 2020-12-31 LAB — BASIC METABOLIC PANEL
Anion gap: 8 (ref 5–15)
BUN: 47 mg/dL — ABNORMAL HIGH (ref 8–23)
CO2: 33 mmol/L — ABNORMAL HIGH (ref 22–32)
Calcium: 8.9 mg/dL (ref 8.9–10.3)
Chloride: 96 mmol/L — ABNORMAL LOW (ref 98–111)
Creatinine, Ser: 1.34 mg/dL — ABNORMAL HIGH (ref 0.44–1.00)
GFR, Estimated: 39 mL/min — ABNORMAL LOW (ref >60)
Glucose, Bld: 90 mg/dL (ref 70–99)
Potassium: 3.9 mmol/L (ref 3.5–5.1)
Sodium: 137 mmol/L (ref 135–145)

## 2020-12-31 LAB — HEPATIC FUNCTION PANEL
ALT: 27 U/L (ref 0–44)
AST: 44 U/L — ABNORMAL HIGH (ref 15–41)
Albumin: 3.4 g/dL — ABNORMAL LOW (ref 3.5–5.0)
Alkaline Phosphatase: 48 U/L (ref 38–126)
Bilirubin, Direct: 0.2 mg/dL (ref 0.0–0.2)
Indirect Bilirubin: 0.5 mg/dL (ref 0.3–0.9)
Total Bilirubin: 0.7 mg/dL (ref 0.3–1.2)
Total Protein: 7.3 g/dL (ref 6.5–8.1)

## 2020-12-31 LAB — CBC
HCT: 33.1 % — ABNORMAL LOW (ref 36.0–46.0)
Hemoglobin: 10 g/dL — ABNORMAL LOW (ref 12.0–15.0)
MCH: 27.5 pg (ref 26.0–34.0)
MCHC: 30.2 g/dL (ref 30.0–36.0)
MCV: 91.2 fL (ref 80.0–100.0)
Platelets: 176 10*3/uL (ref 150–400)
RBC: 3.63 MIL/uL — ABNORMAL LOW (ref 3.87–5.11)
RDW: 19.3 % — ABNORMAL HIGH (ref 11.5–15.5)
WBC: 4.7 10*3/uL (ref 4.0–10.5)
nRBC: 0 % (ref 0.0–0.2)

## 2020-12-31 LAB — BRAIN NATRIURETIC PEPTIDE: B Natriuretic Peptide: 408 pg/mL — ABNORMAL HIGH (ref 0.0–100.0)

## 2020-12-31 NOTE — ED Triage Notes (Signed)
Pt c/o swelling to bilateral feet x one week; pt states she has been taking her "fluid" pills like she should but continues to have swelling; pt states he weighs 200lbs and is usually around 180

## 2020-12-31 NOTE — ED Provider Notes (Signed)
[facility] EMERGENCY DEPARTMENT Provider Note   CSN: [id number] Arrival date & time: 12/31/20  1038     History Chief Complaint  Patient presents with  . Edema    swelling to feet x one week    [name] [name] is a 87 y.o. female.  In patient presenting with the complaint of bilateral leg swelling and weight gain.  Patient is actually followed by care management for her heart failure.  And was interviewed by them yesterday.  Her nephrologist recently increased her Lasix from 20 mg twice a day to 40 mg twice a day.  Patient in no respiratory distress.  But she states she gets some shortness of breath.  Denies any chest pain.  Patient is normally on 2 to 3 L of oxygen at home all the time.  Patient followed by nephrology cardiology and Dr. [name].  Patient appears to be in no respiratory distress here.  Speaking in complete sentences.  Patient known to have some confusion some dementia.  But will answer questions.        Past Medical History:  Diagnosis Date  . Acute on chronic diastolic CHF (congestive heart failure) ([place]) 05/19/2012  . Acute on chronic respiratory failure with hypoxia ([place]) 01/31/2020  . Adenomatous polyp of colon 12/16/2002   Dr. [name]/[facility]  . Allergy   . Calculus of gallbladder with chronic cholecystitis without obstruction 02/09/2009   Qualifier: Diagnosis of  By: [name], [name]    . Cataract   . CHB (complete heart block) ([place]) 10/07/2014      . CHF (congestive heart failure) ([place])    a.  EF previously reduced to 25-30% b. EF improved to 60-65% by echo in 10/2017.   Marland Kitchen Chronic gout due to renal impairment    Per [facility] Electronic [facility] Records [facility]   . Chronic kidney disease, stage I 2006  . Cognitive communication deficit    Per [facility], [facility] Records [facility]   . Constipation       . Convulsion ([place])    Per [facility], [facility] Electronic  Medical Records System   . COPD (chronic obstructive pulmonary disease) ([place])       . DJD (degenerative joint disease) of lumbar spine   . DM (diabetes mellitus) ([place]) 2006  . Dysphagia, unspecified    Per [facility], [facility] Electronic [facility] Records [facility]   . Esophageal reflux   . Glaucoma   . Gout       . H/O adenomatous polyp of colon 05/24/2012   2004 in [place] colonoscopy 2008 normal    . H/O heart valve replacement with mechanical valve    a. s/p mechanical AVR in 2001 and mechanical MVR in 2004.  Marland Kitchen H/O wisdom tooth extraction   . HIP PAIN, RIGHT 04/23/2009   Qualifier: Diagnosis of  By: [name], [name]    . Hyperkalemia    Per [facility], [facility] Records [facility]   . Hyperlipemia   . Hypertensive heart and kidney disease with HF and with CKD stage I-IV ([place])    Per [facility] Records [facility]   . IDA (iron deficiency anemia)    Parenteral iron/[name]  . Insomnia       . Localized edema    Left arm, Per [facility] Electronic Medical Records [facility]   . Long term (current) use of anticoagulants  Per Zadie Cleverly, [facility] Electronic Medical Records [facility]   . Nausea without vomiting 06/07/2010   Qualifier: Diagnosis of  By: [name], [name]    . Non-ischemic cardiomyopathy ([place])    a. s/p MDT CRTD  . Obesity, unspecified   . Oxygen deficiency 2014   nocturnal;  . Papilloma of breast 03/06/2012   This was diagnosed as a left breast papilloma by ultrasound characteristics and symptoms in 2010. Because of possible medical risk factors no surgical intervention has been done and it did doing annual followups. The area has been stable by physical examination and mammograms and ultrasound since 2010.   . Presence of cardiac pacemaker    Per [facility] Electronic Medical Records [facility]   . Spinal stenosis of lumbar region 02/19/2013  .  Spondylolisthesis   . Spondylosis   . Thyroid cancer ([place])    remote thyroidectomy, no recurrence, pt denies in 2016 thyroid cancer  . Type 2 diabetes mellitus with hyperglycemia ([place]) 07/15/2010  . Type 2 diabetes mellitus with hypoglycemia without coma ([place])    Per [facility] Records [facility]   . Unspecified chronic bronchitis ([place])    Per [facility] Records [facility]   . Unspecified hypothyroidism       . Urinary tract infection, site not specified    Per [facility] Records [facility]   . 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 Vertigo         Patient Active Problem List   Diagnosis Date Noted  . Gait abnormality 08/24/2020  . Chronic constipation 08/09/2020  . Acute UTI 08/03/2020  . Hypertensive heart and kidney disease with chronic systolic congestive heart failure and stage 4 chronic kidney disease ([place]) 07/15/2020  . Hyperlipidemia associated with type 2 diabetes mellitus ([place]) 07/15/2020  . Seizure ([place]) 07/07/2020  . Goals of care, counseling/discussion   . Palliative care by specialist   . Idiopathic hypotension   . CHF exacerbation ([place]) 06/29/2020  . Arthritis of right knee 06/20/2020  . Leg swelling 06/16/2020  . Generalized osteoarthritis 05/09/2020  . Primary osteoarthritis, left shoulder 04/21/2020  . Acute systolic CHF (congestive heart failure) ([place]) 03/31/2020  . Bilateral lower extremity edema   . Acute on chronic congestive heart failure ([place]) 03/30/2020  . Right-sided epistaxis 03/28/2020  . Acute pain of left shoulder 01/15/2020  . AAA (abdominal aortic aneurysm) without rupture ([place]) 01/14/2020  . COVID-19 virus detected 11/23/2019  . Stage 3b chronic kidney disease ([place]) 08/04/2019  . Dyspnea 05/30/2019  . Anemia 05/30/2019  . Gout   . Glaucoma   . GERD (gastroesophageal reflux disease)   . Physical debility  04/28/2019  . Encounter for examination following treatment at hospital 11/03/2018  . Hyponatremia 09/27/2018  . H/O mitral valve replacement with mechanical valve 08/02/2018  . Chronic right hip pain 04/16/2018  . Chronic systolic congestive heart failure ([place]) 11/15/2017  . COPD with acute exacerbation ([place]) 11/15/2017  . Trochanteric bursitis, right hip 03/07/2017  . Persistent atrial fibrillation ([place])   . Nocturnal hypoxia 10/08/2016  . Dependence on nocturnal oxygen therapy 10/08/2016  . Hematuria 03/20/2016  . S/P ICD (internal cardiac defibrillator) procedure 02/08/2015  . Presence of cardiac pacemaker 02/08/2015  . CHB (complete heart block) ([place]) 10/07/2014  . Fatigue 07/20/2014  . Absolute anemia 06/24/2014  . Osteopenia 02/10/2014  . Encounter for therapeutic drug monitoring 10/29/2013  . OA (osteoarthritis)  of knee 02/19/2013  . Chronic pain of right knee 01/02/2013  . Hemolytic anemia ([place]) 09/24/2012  . High risk medication use 05/24/2012  . COPD (chronic obstructive pulmonary disease) ([place]) 05/19/2012  . Chronic anticoagulation, on coumadin for Mechanical AVR and MVR 04/01/2012  . Cardiorenal syndrome with renal failure 03/22/2012  . Hx of aortic valve replacement, mechanical 03/22/2012  . S/P MVR (mitral valve replacement) 03/22/2012  . Biventricular automatic implantable cardioverter defibrillator in situ 03/22/2012  . CKD (chronic kidney disease), stage IV ([place]) 03/22/2012  . Warfarin-induced coagulopathy ([place]) 03/21/2012  . Iron deficiency 10/04/2011  . Allergic rhinitis 02/14/2011  . Mitral valve disease 07/15/2010  . Sinus node dysfunction ([place]) 07/15/2010  . Type 2 diabetes mellitus with hypoglycemia without coma ([place]) 07/15/2010  . VARICOSE VEINS LOWER EXTREMITIES W/OTH COMPS 02/09/2009  . Prediabetes 06/15/2008  . Hypothyroidism, postsurgical 02/24/2008  . Hyperlipidemia LDL goal <70 02/24/2008  . Essential hypertension 02/24/2008  . Non-ischemic  cardiomyopathy with ICD 02/24/2008  . Spondylosis 02/24/2008    Past Surgical History:  Procedure Laterality Date  . AORTIC AND MITRAL VALVE REPLACEMENT  2001  . BI-VENTRICULAR IMPLANTABLE CARDIOVERTER DEFIBRILLATOR UPGRADE N/A 01/18/2015   Procedure: BI-VENTRICULAR IMPLANTABLE CARDIOVERTER DEFIBRILLATOR UPGRADE;  Surgeon: Evans Lance, MD;  Location: [facility] C[facility] LAB;  Service: Cardiovascular;  Laterality: N/A;  . BIV ICD GENERATOR CHANGEOUT N/A 01/30/2019   Procedure: BIV ICD GENERATOR CHANGEOUT;  Surgeon: Evans Lance, MD;  Location: [place] CV LAB;  Service: Cardiovascular;  Laterality: N/A;  . CARDIAC CATHETERIZATION    . CARDIAC VALVE REPLACEMENT    . CARDIOVERSION N/A 11/13/2016   Procedure: CARDIOVERSION;  Surgeon: Sanda Klein, MD;  Location: [facility] ENDOSCOPY;  Service: Cardiovascular;  Laterality: N/A;  . COLONOSCOPY  06/12/2007   Rourk- Normal rectum/Normal colon  . COLONOSCOPY  12/16/02   small hemorrhoids  . COLONOSCOPY  06/20/2012   Procedure: COLONOSCOPY;  Surgeon: Daneil Dolin, MD;  Location: [facility] ENDO SUITE;  Service: Endoscopy;  Laterality: N/A;  10:45  . defibrillator implanted 2006    . DOPPLER ECHOCARDIOGRAPHY  2012  . DOPPLER ECHOCARDIOGRAPHY  05,06,07,08,09,2011  . ESOPHAGOGASTRODUODENOSCOPY  06/12/2007   Rourk- normal esophagus, small hiatal hernia, otherwise normal stomach, D1, D2  . EYE SURGERY Right 2005  . EYE SURGERY Left 2006  . ICD LEAD REMOVAL Left 02/08/2015   Procedure: ICD LEAD REMOVAL;  Surgeon: Evans Lance, MD;  Location: [facility] OR;  Service: Cardiovascular;  Laterality: Left;  "Will plan extraction and insertion of a BiV PM"  **Dr. Roxan Hockey backing up case**  . IMPLANTABLE CARDIOVERTER DEFIBRILLATOR (ICD) GENERATOR CHANGE Left 02/08/2015   Procedure: ICD GENERATOR CHANGE;  Surgeon: Evans Lance, MD;  Location: [place];  Service: Cardiovascular;  Laterality: Left;  . INSERT / REPLACE / REMOVE PACEMAKER    . PACEMAKER INSERTION  May 2016  .  pacemaker placed  2004  . right breast cyst removed     benign   . right cataract removed     2005  . TEE WITHOUT CARDIOVERSION N/A 11/13/2016   Procedure: TRANSESOPHAGEAL ECHOCARDIOGRAM (TEE);  Surgeon: Sanda Klein, MD;  Location: [facility] ENDOSCOPY;  Service: Cardiovascular;  Laterality: N/A;  . THYROIDECTOMY       OB History    Gravida      Para      Term      Preterm      AB      Living  0     SAB  IAB      Ectopic      Multiple      Live Births              Family History  Problem Relation Age of Onset  . Pancreatic cancer Mother   . Heart disease Father   . Heart disease Sister   . Heart attack Sister   . Heart disease Brother   . Diabetes Brother   . Heart disease Brother   . Diabetes Brother   . Hypertension Brother   . Lung cancer Brother     Social History   Tobacco Use  . Smoking status: Former Smoker    Packs/day: 0.75    Years: 40.00    Pack years: 30.00    Types: Cigarettes    Quit date: 03/08/1987    Years since quitting: 33.8  . Smokeless tobacco: Never Used  Vaping Use  . Vaping Use: Never used  Substance Use Topics  . Alcohol use: No    Alcohol/week: 0.0 standard drinks  . Drug use: No    Home Medications Prior to Admission medications   Medication Sig Start Date End Date Taking? Authorizing Provider  acetaminophen (TYLENOL) 500 MG tablet Take 1 tablet (500 mg total) by mouth every 6 (six) hours as needed (pain). 07/08/20   Oretha Milch D, MD  albuterol (PROVENTIL) (2.5 MG/3ML) 0.083% nebulizer solution Take 3 mLs (2.5 mg total) by nebulization in the morning and at bedtime. *May use additional one time if needed for shortness of breath 08/11/20   Gerlene Fee, NP  albuterol (VENTOLIN HFA) 108 (90 Base) MCG/ACT inhaler Inhale 1 puff into the lungs every 6 (six) hours as needed for wheezing or shortness of breath. 09/16/20   Fayrene Helper, MD  allopurinol (ZYLOPRIM) 100 MG tablet TAKE ONE TABLET BY MOUTH ONCE  DAILY. 12/06/20   Fayrene Helper, MD  benzonatate (TESSALON) 100 MG capsule Take 1 capsule (100 mg total) by mouth 2 (two) times daily as needed for cough. 09/22/20   Noreene Larsson, NP  BREO ELLIPTA 100-25 MCG/INH AEPB INHALE (1) PUFF BY MOUTH INTO THE LUNGS DAILY 12/06/20   Fayrene Helper, MD  cholecalciferol (VITAMIN D3) 25 MCG (1000 UNIT) tablet Take 1 tablet (1,000 Units total) by mouth in the morning. 07/08/20   Desiree Hane, MD  fluticasone (FLONASE) 50 MCG/ACT nasal spray Place 2 sprays into both nostrils daily. 07/08/20   Desiree Hane, MD  folic acid (FOLVITE) 1 MG tablet TAKE ONE TABLET BY MOUTH ONCE DAILY. 12/06/20   Fayrene Helper, MD  furosemide (LASIX) 40 MG tablet Take 1 tablet (40 mg total) by mouth 2 (two) times daily. 11/11/20   Noreene Larsson, NP  gabapentin (NEURONTIN) 300 MG capsule TAKE (1) CAPSULE BY MOUTH TWICE DAILY 12/06/20   Fayrene Helper, MD  hydrALAZINE (APRESOLINE) 25 MG tablet Take 25 mg by mouth 3 (three) times daily.    [provider]  HYDROcodone-homatropine (HYCODAN) 5-1.5 MG/5ML syrup Take 5 mLs by mouth every 8 (eight) hours as needed for cough. 09/28/20   Jacquelin Hawking, NP  levETIRAcetam (KEPPRA) 500 MG tablet TAKE (1) TABLET BY MOUTH TWICE DAILY AT 9AM AND 9PM 12/06/20   Fayrene Helper, MD  levofloxacin (LEVAQUIN) 500 MG tablet Take 1 tablet (500 mg total) by mouth daily. 09/22/20   Noreene Larsson, NP  [facility] PAIN RELIEF PATCH EX) Apply 1 patch topically daily as needed (pain).  [provider]  metoprolol tartrate (LOPRESSOR) 50 MG tablet TAKE (1) TABLET BY MOUTH TWICE DAILY AT 9AM AND 9PM. 12/06/20   Fayrene Helper, MD  nitroGLYCERIN (NITROSTAT) 0.4 MG SL tablet DISSOLVE 1 TABLET SUBLINGUALLY AS NEEDED FOR CHEST PAIN, MAY REPEAT EVERY 5 MINUTES. AFTER 3 CALL 911. 09/08/20   Fayrene Helper, MD  NON FORMULARY Diet Change: Soft diet with dys 2 (ground) meats, thin liquids (NAS, cons CHO,  heart healthy) 07/26/20   [provider]  ondansetron (ZOFRAN) 4 MG tablet Take by mouth at bedtime. 05/27/20   [provider]  ondansetron (ZOFRAN-ODT) 4 MG disintegrating tablet TAKE (1) TABLET BY MOUTH EVERY EIGHT HOURS AS NEEDED FOR NAUSEA OR VOMITING 12/22/20   Fayrene Helper, MD  OXYGEN Inhale 3 L into the lungs continuous. 07/08/20   [provider]  Polyethyl [name] (SYSTANE) 0.4-0.3 % SOLN Apply 1 drop to eye at bedtime. Left eye    [provider]  polyethylene glycol (MIRALAX / GLYCOLAX) 17 g packet Take 17 g by mouth daily as needed. 07/08/20   Oretha Milch D, MD  potassium chloride SA (KLOR-CON) 20 MEQ tablet Take 1 tablet (20 mEq total) by mouth daily. 11/11/20   Noreene Larsson, NP  pravastatin (PRAVACHOL) 40 MG tablet TAKE ONE TABLET BY MOUTH ONCE DAILY. 12/06/20   Fayrene Helper, MD  senna (SENOKOT) 8.6 MG TABS tablet Take 2 tablets (17.2 mg total) by mouth daily as needed for mild constipation. 07/08/20   Desiree Hane, MD  SYNTHROID 88 MCG tablet TAKE 1 TABLET BEFORE BREAKFAST. 12/06/20   Fayrene Helper, MD  UNABLE TO FIND Compression Stockings R-  Ankle  26.5cm   Calf 38cm  Leg  44cm L-  Ankle  27.5 cm   Calf 39cm  Leg  44cm Patient taking differently: Compression Stockings R-  Ankle  26.5cm   Calf 38cm  Leg  44cm L-  Ankle  27.5 cm   Calf 39cm  Leg  44cm 09/15/20   Fayrene Helper, MD  UNABLE TO FIND 15-19 mmhg compression hose, knee high 11/04/20   Fayrene Helper, MD  warfarin (COUMADIN) 10 MG tablet TAKE (1) TABLET BY MOUTH DAILY AT 4PM 12/29/20   Fayrene Helper, MD    Allergies    Penicillins, Aspirin, Fentanyl, and Niacin  Review of Systems   Review of Systems  Constitutional: Negative for chills and fever.  HENT: Negative for rhinorrhea and sore throat.   Eyes: Negative for visual disturbance.  Respiratory: Positive for shortness of breath. Negative for cough.   Cardiovascular: Positive  for leg swelling. Negative for chest pain.  Gastrointestinal: Negative for abdominal pain, diarrhea, nausea and vomiting.  Genitourinary: Negative for dysuria.  Musculoskeletal: Negative for back pain and neck pain.  Skin: Negative for rash.  Neurological: Negative for dizziness, light-headedness and headaches.  Hematological: Does not bruise/bleed easily.  Psychiatric/Behavioral: Negative for confusion.    Physical Exam Updated Vital Signs BP (!) 181/89 (BP Location: Right Arm)   Pulse (!) 59   Temp 98.4 F (36.9 C) (Oral)   Resp 18   Ht 1.702 m (5\' 7" )   Wt 90.7 kg   SpO2 96%   BMI 31.32 kg/m   Physical Exam Vitals and nursing note reviewed.  Constitutional:      General: She is not in acute distress.    Appearance: Normal appearance. She is well-developed. She is not toxic-appearing.  HENT:  Head: Normocephalic and atraumatic.  Eyes:     Extraocular Movements: Extraocular movements intact.     Conjunctiva/sclera: Conjunctivae normal.  Cardiovascular:     Rate and Rhythm: Normal rate and regular rhythm.     Heart sounds: No murmur heard.   Pulmonary:     Effort: Pulmonary effort is normal. No respiratory distress.     Breath sounds: Normal breath sounds. No wheezing.  Abdominal:     Palpations: Abdomen is soft.     Tenderness: There is no abdominal tenderness.  Musculoskeletal:     Cervical back: Neck supple.     Right lower leg: Edema present.     Left lower leg: Edema present.  Skin:    General: Skin is warm and dry.  Neurological:     General: No focal deficit present.     Mental Status: She is alert. Mental status is at baseline.     ED Results / Procedures / Treatments   Labs (all labs ordered are listed, but only abnormal results are displayed) Labs Reviewed  BASIC METABOLIC PANEL  CBC    EKG EKG Interpretation  Date/Time:  Friday December 31 2020 11:20:53 EDT Ventricular Rate:  60 PR Interval:    QRS Duration: 178 QT Interval:  522 QTC  Calculation: 522 R Axis:   -34 Text Interpretation: Ventricular-paced rhythm Abnormal ECG No significant change since last tracing Confirmed by Fredia Sorrow [phone]) on 12/31/2020 11:32:24 AM   Radiology No results found.  Procedures Procedures   Medications Ordered in ED Medications - No data to display  ED Course  I have reviewed the triage vital signs and the nursing notes.  Pertinent labs & imaging results that were available during my care of the patient were reviewed by me and considered in my medical decision making (see chart for details).    MDM Rules/Calculators/A&P                          Patient's chest x-ray here more with chronic changes of congestive heart failure.  Renal function without significant changes.  Which is reassuring since her Lasix has been increased.  BNP is elevated at 408 but this is sort of in the ballpark is normal for her.  Recommend patient continue the increase Lasix dose as recommended by her nephrologist.  And follow back up with her doctors.  She has caregivers at home  Final Clinical Impression(s) / ED Diagnoses Final diagnoses:  None    Rx / DC Orders ED Discharge Orders    None       Fredia Sorrow, MD 12/31/20 1457

## 2020-12-31 NOTE — ED Notes (Signed)
DC instructions reviewed with pt. Pt called family member to pick her up. No additional questions at discharge.

## 2020-12-31 NOTE — Discharge Instructions (Addendum)
Work-up here without any significant changes in your congestive heart failure status.  Your kidney function is good today so the recent increase in the Lasix to 40 mg twice a day as recommended by your nephrologist should continue.  Make an appointment to follow-up with cardiology.  Return for any new or worse symptoms.

## 2020-12-31 NOTE — ED Notes (Signed)
Pt A&Ox3. Wears O2 2.5L via [place] at home. Reports intermittent bilateral lower extremity edema that varies from day to day. Has noticed more frequent edema over the past week. States she does not feel dyspneic at rest but will sometimes feel short of breath after having long discussions, and with exertional periods. Lungs clear but diminished bilaterally. Non-piting edema noted to bilateral lower extremities.

## 2021-01-03 ENCOUNTER — Telehealth: Payer: Self-pay | Admitting: Cardiovascular Disease

## 2021-01-03 NOTE — Telephone Encounter (Signed)
  Pt c/o swelling: STAT is pt has developed SOB within 24 hours  1) How much weight have you gained and in what time span? no  2) If swelling, where is the swelling located? Legs  3) Are you currently taking a fluid pill? Yes, taking 80mg  in am and 80 mg at night. Her kidney doctor told her to double the dose  4) Are you currently SOB? SOB on exertion  5) Do you have a log of your daily weights (if so, list)? no  6) Have you gained 3 pounds in a day or 5 pounds in a week? no  7) Have you traveled recently?  no

## 2021-01-03 NOTE — Telephone Encounter (Signed)
Pt called to report that she was recently in the ED for leg edema and SOB with exertion. The nephrologist had increased her Lasix to 80 mg twice daily for one week only. She says she has had much relief and feeling much better the edema is improving.   She has an appt for a pacer check next week... she is seeing her nephrologist 01/21/21 and she says he advised he will be getting labs at that time.   She will continue to [place] and will call back if any problems.

## 2021-01-04 ENCOUNTER — Other Ambulatory Visit: Payer: Self-pay | Admitting: Nurse Practitioner

## 2021-01-04 ENCOUNTER — Other Ambulatory Visit: Payer: Self-pay | Admitting: Family Medicine

## 2021-01-05 DIAGNOSIS — M1711 Unilateral primary osteoarthritis, right knee: Secondary | ICD-10-CM | POA: Diagnosis not present

## 2021-01-05 DIAGNOSIS — N184 Chronic kidney disease, stage 4 (severe): Secondary | ICD-10-CM | POA: Diagnosis not present

## 2021-01-05 DIAGNOSIS — E1122 Type 2 diabetes mellitus with diabetic chronic kidney disease: Secondary | ICD-10-CM | POA: Diagnosis not present

## 2021-01-05 DIAGNOSIS — I130 Hypertensive heart and chronic kidney disease with heart failure and stage 1 through stage 4 chronic kidney disease, or unspecified chronic kidney disease: Secondary | ICD-10-CM | POA: Diagnosis not present

## 2021-01-05 DIAGNOSIS — I5042 Chronic combined systolic (congestive) and diastolic (congestive) heart failure: Secondary | ICD-10-CM | POA: Diagnosis not present

## 2021-01-05 DIAGNOSIS — I4819 Other persistent atrial fibrillation: Secondary | ICD-10-CM | POA: Diagnosis not present

## 2021-01-07 ENCOUNTER — Other Ambulatory Visit: Payer: Self-pay | Admitting: Family Medicine

## 2021-01-10 ENCOUNTER — Telehealth: Payer: Self-pay

## 2021-01-10 ENCOUNTER — Other Ambulatory Visit: Payer: Self-pay

## 2021-01-10 DIAGNOSIS — M1711 Unilateral primary osteoarthritis, right knee: Secondary | ICD-10-CM | POA: Diagnosis not present

## 2021-01-10 DIAGNOSIS — E1122 Type 2 diabetes mellitus with diabetic chronic kidney disease: Secondary | ICD-10-CM | POA: Diagnosis not present

## 2021-01-10 DIAGNOSIS — N184 Chronic kidney disease, stage 4 (severe): Secondary | ICD-10-CM | POA: Diagnosis not present

## 2021-01-10 DIAGNOSIS — J441 Chronic obstructive pulmonary disease with (acute) exacerbation: Secondary | ICD-10-CM

## 2021-01-10 DIAGNOSIS — I4819 Other persistent atrial fibrillation: Secondary | ICD-10-CM | POA: Diagnosis not present

## 2021-01-10 DIAGNOSIS — I5042 Chronic combined systolic (congestive) and diastolic (congestive) heart failure: Secondary | ICD-10-CM | POA: Diagnosis not present

## 2021-01-10 DIAGNOSIS — I130 Hypertensive heart and chronic kidney disease with heart failure and stage 1 through stage 4 chronic kidney disease, or unspecified chronic kidney disease: Secondary | ICD-10-CM | POA: Diagnosis not present

## 2021-01-10 MED ORDER — ALBUTEROL SULFATE HFA 108 (90 BASE) MCG/ACT IN AERS: 1.0000 | INHALATION_SPRAY | Freq: Four times a day (QID) | RESPIRATORY_TRACT | 3 refills | Status: DC | PRN

## 2021-01-10 MED ORDER — ALBUTEROL SULFATE (2.5 MG/3ML) 0.083% IN NEBU: 2.5000 mg | INHALATION_SOLUTION | Freq: Two times a day (BID) | RESPIRATORY_TRACT | 3 refills | Status: DC

## 2021-01-10 NOTE — Telephone Encounter (Signed)
Wasn't sure where to send it- if she calls back it was sent to [facility]. Mail order was also a listed pharmacy but she didn't specify so it went to [facility]

## 2021-01-10 NOTE — Telephone Encounter (Signed)
albuterol (PROVENTIL) (2.5 MG/3ML) 0.083% nebulizer solutio   Pt needs a refill of Albuterol inhalor

## 2021-01-11 ENCOUNTER — Other Ambulatory Visit: Payer: Self-pay

## 2021-01-11 ENCOUNTER — Telehealth: Payer: Self-pay

## 2021-01-11 ENCOUNTER — Encounter: Payer: Self-pay | Admitting: Internal Medicine

## 2021-01-11 ENCOUNTER — Ambulatory Visit (INDEPENDENT_AMBULATORY_CARE_PROVIDER_SITE_OTHER): Payer: Medicare Other | Admitting: Internal Medicine

## 2021-01-11 VITALS — BP 162/84 | HR 86 | Ht 67.0 in | Wt 193.0 lb

## 2021-01-11 DIAGNOSIS — Z79899 Other long term (current) drug therapy: Secondary | ICD-10-CM | POA: Diagnosis not present

## 2021-01-11 DIAGNOSIS — I442 Atrioventricular block, complete: Secondary | ICD-10-CM

## 2021-01-11 MED ORDER — FUROSEMIDE 40 MG PO TABS: ORAL_TABLET | ORAL | 3 refills | Status: DC

## 2021-01-11 NOTE — Telephone Encounter (Signed)
Pt is renewing equipment, they need to know last dos

## 2021-01-11 NOTE — Progress Notes (Signed)
HPI [name] [name] returns today for followup of her biv PPM. She is a pleasant 87 yo woman with a h/o valvular heart disease, CHB, atrial fib and flutter, s/p ICD insertion with recent down grade to a biv PPM. She has done well since her procedure but does not that she is more sedentary. She had not had chest pain or sob. She has hd worsening peripheral edema. Her diuretic was increased but reduce back to 40 mg daily. No palpitations.  Allergies  Allergen Reactions  . Penicillins Other (See Comments)    Bruise    . Aspirin Other (See Comments)    On coumadin  . Fentanyl Dermatitis    Rash with patch   . Niacin Itching     Current Outpatient Medications  Medication Sig Dispense Refill  . acetaminophen (TYLENOL) 500 MG tablet Take 1 tablet (500 mg total) by mouth every 6 (six) hours as needed (pain). 30 tablet 0  . albuterol (PROVENTIL) (2.5 MG/3ML) 0.083% nebulizer solution Take 3 mLs (2.5 mg total) by nebulization in the morning and at bedtime. *May use additional one time if needed for shortness of breath 175 mL 3  . albuterol (VENTOLIN HFA) 108 (90 Base) MCG/ACT inhaler Inhale 1 puff into the lungs every 6 (six) hours as needed for wheezing or shortness of breath. 6.7 g 3  . allopurinol (ZYLOPRIM) 100 MG tablet TAKE ONE TABLET BY MOUTH ONCE DAILY. 30 tablet 0  . BREO ELLIPTA 100-25 MCG/INH AEPB INHALE (1) PUFF BY MOUTH INTO THE LUNGS DAILY 60 each 0  . fluticasone (FLONASE) 50 MCG/ACT nasal spray Place 2 sprays into both nostrils daily. (Patient taking differently: Place 2 sprays into both nostrils daily as needed for allergies.) 48 g 1  . folic acid (FOLVITE) 1 MG tablet TAKE ONE TABLET BY MOUTH ONCE DAILY. 30 tablet 0  . furosemide (LASIX) 40 MG tablet Take 40 mg daily, except take 40 mg TWICE A DAY on Saturday and Sunday 110 tablet 3  . gabapentin (NEURONTIN) 300 MG capsule TAKE (1) CAPSULE BY MOUTH TWICE DAILY 60 capsule 0  . hydrALAZINE (APRESOLINE) 25 MG tablet Take 25 mg  by mouth 3 (three) times daily.    Marland Kitchen levETIRAcetam (KEPPRA) 500 MG tablet TAKE (1) TABLET BY MOUTH TWICE DAILY AT 9AM AND 9PM 60 tablet 0  . metoprolol tartrate (LOPRESSOR) 50 MG tablet TAKE (1) TABLET BY MOUTH TWICE DAILY AT 9AM AND 9PM. 60 tablet 0  . nitroGLYCERIN (NITROSTAT) 0.4 MG SL tablet DISSOLVE 1 TABLET SUBLINGUALLY AS NEEDED FOR CHEST PAIN, MAY REPEAT EVERY 5 MINUTES. AFTER 3 CALL 911. (Patient taking differently: Place 0.4 mg under the tongue every 5 (five) minutes as needed for chest pain.) 30 tablet 0  . ondansetron (ZOFRAN-ODT) 4 MG disintegrating tablet TAKE (1) TABLET BY MOUTH EVERY EIGHT HOURS AS NEEDED FOR NAUSEA OR VOMITING (Patient taking differently: Take 4 mg by mouth every 8 (eight) hours as needed for nausea or vomiting.) 10 tablet 0  . OXYGEN Inhale 3 L into the lungs continuous.    . potassium chloride SA (KLOR-CON) 20 MEQ tablet TAKE ONE TABLET BY MOUTH ONCE DAILY. 30 tablet 0  . pravastatin (PRAVACHOL) 40 MG tablet TAKE ONE TABLET BY MOUTH ONCE DAILY. 30 tablet 0  . SYNTHROID 88 MCG tablet TAKE 1 TABLET BEFORE BREAKFAST. 30 tablet 0  . warfarin (COUMADIN) 10 MG tablet TAKE (1) TABLET BY MOUTH DAILY AT 4PM (Patient taking differently: Take 10 mg by mouth daily  at 6 (six) AM.) 30 tablet 5  . benzonatate (TESSALON) 100 MG capsule Take 1 capsule (100 mg total) by mouth 2 (two) times daily as needed for cough. (Patient not taking: No sig reported) 20 capsule 0  . cholecalciferol (VITAMIN D3) 25 MCG (1000 UNIT) tablet Take 1 tablet (1,000 Units total) by mouth in the morning. (Patient not taking: No sig reported)    . HYDROcodone-homatropine (HYCODAN) 5-1.5 MG/5ML syrup Take 5 mLs by mouth every 8 (eight) hours as needed for cough. (Patient not taking: No sig reported) 120 mL 0  . Liniments (SALONPAS PAIN RELIEF PATCH EX) Apply 1 patch topically daily as needed (pain).  (Patient not taking: Reported on 01/11/2021)    . NON FORMULARY Diet Change: Soft diet with dys 2 (ground)  meats, thin liquids (NAS, cons CHO, heart healthy)    . polyethylene glycol (MIRALAX / GLYCOLAX) 17 g packet Take 17 g by mouth daily as needed. (Patient not taking: Reported on 01/11/2021) 14 each 0  . senna (SENOKOT) 8.6 MG TABS tablet Take 2 tablets (17.2 mg total) by mouth daily as needed for mild constipation. (Patient not taking: No sig reported) 120 tablet 0  . UNABLE TO FIND Compression Stockings R-  Ankle  26.5cm   Calf 38cm  Leg  44cm L-  Ankle  27.5 cm   Calf 39cm  Leg  44cm (Patient taking differently: Compression Stockings R-  Ankle  26.5cm   Calf 38cm  Leg  44cm L-  Ankle  27.5 cm   Calf 39cm  Leg  44cm) 1 each 0  . UNABLE TO FIND 15-19 mmhg compression hose, knee high 1 Package 0   No current facility-administered medications for this visit.   Facility-Administered Medications Ordered in Other Visits  Medication Dose Route Frequency Provider Last Rate Last Admin  . epoetin alfa (EPOGEN,PROCRIT) injection 40,000 Units  40,000 Units Subcutaneous Once Farrel Gobble, MD         Past Medical History:  Diagnosis Date  . Acute on chronic diastolic CHF (congestive heart failure) ([place]) 05/19/2012  . Acute on chronic respiratory failure with hypoxia ([place]) 01/31/2020  . Adenomatous polyp of colon 12/16/2002   Dr. [name]/[facility]  . Allergy   . Calculus of gallbladder with chronic cholecystitis without obstruction 02/09/2009   Qualifier: Diagnosis of  By: [name], [name]    . Cataract   . CHB (complete heart block) ([place]) 10/07/2014      . CHF (congestive heart failure) ([place])    a.  EF previously reduced to 25-30% b. EF improved to 60-65% by echo in 10/2017.   Marland Kitchen Chronic gout due to renal impairment    Per [facility] Records [facility]   . Chronic kidney disease, stage I 2006  . Cognitive communication deficit    Per [facility]   . Constipation       .  Convulsion ([place])    Per [facility]   . COPD (chronic obstructive pulmonary disease) ([place])       . DJD (degenerative joint disease) of lumbar spine   . DM (diabetes mellitus) ([place]) 2006  . Dysphagia, unspecified    Per [facility] Records [facility]   . Esophageal reflux   . Glaucoma   . Gout       . H/O adenomatous polyp of colon 05/24/2012   2004  in [place] Last colonoscopy 2008 normal    . H/O heart valve replacement with mechanical valve    a. s/p mechanical AVR in 2001 and mechanical MVR in 2004.  Marland Kitchen H/O wisdom tooth extraction   . HIP PAIN, RIGHT 04/23/2009   Qualifier: Diagnosis of  By: [name], [name]    . Hyperkalemia    Per [facility]   . Hyperlipemia   . Hypertensive heart and kidney disease with HF and with CKD stage I-IV ([place])    Per [facility]   . IDA (iron deficiency anemia)    Parenteral iron/[name]  . Insomnia       . Localized edema    Left arm, Per [facility] Records [facility]   . Long term (current) use of anticoagulants    Per [facility]   . Nausea without vomiting 06/07/2010   Qualifier: Diagnosis of  By: [name], [name]    . Non-ischemic cardiomyopathy ([place])    a. s/p MDT CRTD  . Obesity, unspecified   . Oxygen deficiency 2014   nocturnal;  . Papilloma of breast 03/06/2012   This was diagnosed as a left breast papilloma by ultrasound characteristics and symptoms in 2010. Because of possible medical risk factors no surgical intervention has been done and it did doing annual followups. The area has been stable by physical examination and mammograms and ultrasound since 2010.   . Presence of cardiac pacemaker    Per [facility]  Records [facility]   . Spinal stenosis of lumbar region 02/19/2013  . Spondylolisthesis   . Spondylosis   . Thyroid cancer ([place])    remote thyroidectomy, no recurrence, pt denies in 2016 thyroid cancer  . Type 2 diabetes mellitus with hyperglycemia ([place]) 07/15/2010  . Type 2 diabetes mellitus with hypoglycemia without coma ([place])    Per [facility] Records [facility]   . Unspecified chronic bronchitis ([place])    Per [facility] Records [facility]   . Unspecified hypothyroidism       . Urinary tract infection, site not specified    Per [facility]   . 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 Vertigo         ROS:   All systems reviewed and negative except as noted in the HPI.   Past Surgical History:  Procedure Laterality Date  . AORTIC AND MITRAL VALVE REPLACEMENT  2001  . BI-VENTRICULAR IMPLANTABLE CARDIOVERTER DEFIBRILLATOR UPGRADE N/A 01/18/2015   Procedure: BI-VENTRICULAR IMPLANTABLE CARDIOVERTER DEFIBRILLATOR UPGRADE;  Surgeon: Evans Lance, MD;  Location[facility] CATH LAB;  Service: Cardiovascular;  Laterality: N/A;  . BIV ICD GENERATOR CHANGEOUT N/A 01/30/2019   Procedure: BIV ICD GENERATOR CHANGEOUT;  Surgeon: Evans Lance, MD;  Location: [place] CV LAB;  Service: Cardiovascular;  Laterality: N/A;  . CARDIAC CATHETERIZATION    . CARDIAC VALVE REPLACEMENT    . CARDIOVERSION N/A 11/13/2016   Procedure: CARDIOVERSION;  Surgeon: Sanda Klein, MD;  Location: [facility] ENDOSCOPY;  Service: Cardiovascular;  Laterality: N/A;  . COLONOSCOPY  06/12/2007   Rourk- Normal rectum/Normal colon  . COLONOSCOPY  12/16/02   small hemorrhoids  . COLONOSCOPY  06/20/2012   Procedure: COLONOSCOPY;  Surgeon: Daneil Dolin, MD;  Location: [facility] ENDO [facility];  Service: Endoscopy;  Laterality: N/A;  10:45  . defibrillator implanted 2006    . DOPPLER ECHOCARDIOGRAPHY  2012   . DOPPLER ECHOCARDIOGRAPHY  05,06,07,08,09,2011  . ESOPHAGOGASTRODUODENOSCOPY  06/12/2007   Rourk- normal esophagus, small hiatal hernia, otherwise normal stomach, D1, D2  . EYE SURGERY Right 2005  . EYE SURGERY Left 2006  . ICD LEAD REMOVAL Left 02/08/2015   Procedure: ICD LEAD REMOVAL;  Surgeon: Evans Lance, MD;  Location: [facility] OR;  Service: Cardiovascular;  Laterality: Left;  "Will plan extraction and insertion of a BiV PM"  **Dr. Roxan Hockey backing up case**  . IMPLANTABLE CARDIOVERTER DEFIBRILLATOR (ICD) GENERATOR CHANGE Left 02/08/2015   Procedure: ICD GENERATOR CHANGE;  Surgeon: Evans Lance, MD;  Location: [place];  Service: Cardiovascular;  Laterality: Left;  . INSERT / REPLACE / REMOVE PACEMAKER    . PACEMAKER INSERTION  May 2016  . pacemaker placed  2004  . right breast cyst removed     benign   . right cataract removed     2005  . TEE WITHOUT CARDIOVERSION N/A 11/13/2016   Procedure: TRANSESOPHAGEAL ECHOCARDIOGRAM (TEE);  Surgeon: Sanda Klein, MD;  Location: [facility] ENDOSCOPY;  Service: Cardiovascular;  Laterality: N/A;  . THYROIDECTOMY       Family History  Problem Relation Age of Onset  . Pancreatic cancer Mother   . Heart disease Father   . Heart disease Sister   . Heart attack Sister   . Heart disease Brother   . Diabetes Brother   . Heart disease Brother   . Diabetes Brother   . Hypertension Brother   . Lung cancer Brother      Social History   Socioeconomic History  . Marital status: Widowed    Spouse name: Not on file  . Number of children: 0  . Years of education: Not on file  . Highest education level: Not on file  Occupational History  . Occupation: retired    Fish farm manager: RETIRED  Tobacco Use  . Smoking status: Former Smoker    Packs/day: 0.75    Years: 40.00    Pack years: 30.00    Types: Cigarettes    Quit date: 03/08/1987    Years since quitting: 33.8  . Smokeless tobacco: Never Used  Vaping Use  . Vaping Use: Never used  Substance and  Sexual Activity  . Alcohol use: No    Alcohol/week: 0.0 standard drinks  . Drug use: No  . Sexual activity: Not Currently  Other Topics Concern  . Not on file  Social History Narrative   From [place] (2004)   Social Determinants of Health   Financial Resource Strain: Low Risk   . Difficulty of Paying Living Expenses: Not hard at all  Food Insecurity: No Food Insecurity  . Worried About Charity fundraiser in the Last Year: Never true  . Ran Out of Food in the Last Year: Never true  Transportation Needs: No Transportation Needs  . Lack of Transportation (Medical): No  . Lack of Transportation (Non-Medical): No  Physical Activity: Inactive  . Days of Exercise per Week: 0 days  . Minutes of Exercise per Session: 0 min  Stress: No Stress Concern Present  . Feeling of Stress : Only a little  Social Connections: Moderately Isolated  . Frequency of Communication with Friends and Family: More than three times a week  . Frequency of Social Gatherings with Friends and Family: Twice a week  . Attends Religious Services: More than 4 times per  year  . Active Member of Clubs or Organizations: No  . Attends Archivist Meetings: Never  . Marital Status: Widowed  Human resources officer Violence: Not on file     BP (!) 162/84   Pulse 86   Ht 5\' 7"  (1.702 m)   Wt 193 lb (87.5 kg)   BMI 30.23 kg/m   Physical Exam:  Well appearing NAD HEENT: Unremarkable Neck:  No JVD, no thyromegally Lymphatics:  No adenopathy Back:  No CVA tenderness Lungs:  Clear with no wheezes HEART:  Regular rate rhythm, no murmurs, no rubs, no clicks Abd:  soft, positive bowel sounds, no organomegally, no rebound, no guarding Ext:  2 plus pulses, no edema, no cyanosis, no clubbing Skin:  No rashes no nodules Neuro:  CN II through XII intact, motor grossly intact  DEVICE  Normal device function.  See PaceArt for details. LV threshold is elevated  Assess/Plan: 1. Atrial fib/flutter - she is  asymptomatic and her VR is controlled. She will continue systemic anti-coagulation. She is not a good candidate for cardioversion or rhythm control. 2. Chronic systolic heart failure -she has had normalization of her LV function though she has had some worsening volume overload. She denies worsening dyspnea 3. BIV PPM  - her medtronic biv PPM is working normally except her LV threshold has increased. We switch her to LV2-3 today and programmed the device accordingly.   [name] Overlie Ashlin Hidalgo,MD

## 2021-01-11 NOTE — Patient Instructions (Signed)
Medication Instructions:  Take Lasix 40 mg daily, EXCEPT on Saturday and Sunday, take Lasix 40 mg TWICE A DAY  *If you need a refill on your cardiac medications before your next appointment, please call your pharmacy*   Lab Work: BMET in 5 weeks ( 5/24)  If you have labs (blood work) drawn today and your tests are completely normal, you will receive your results only by: Marland Kitchen MyChart Message (if you have MyChart) OR . A paper copy in the mail If you have any lab test that is abnormal or we need to change your treatment, we will call you to review the results.   Testing/Procedures: None today   Follow-Up: At [facility], you and your health needs are our priority.  As part of our continuing mission to provide you with exceptional heart care, we have created designated Provider Care Teams.  These Care Teams include your primary Cardiologist (physician) and Advanced Practice Providers (APPs -  Physician Assistants and Nurse Practitioners) who all work together to provide you with the care you need, when you need it.  We recommend signing up for the patient portal called "MyChart".  Sign up information is provided on this After Visit Summary.  MyChart is used to connect with patients for Virtual Visits (Telemedicine).  Patients are able to view lab/test results, encounter notes, upcoming appointments, etc.  Non-urgent messages can be sent to your provider as well.   To learn more about what you can do with MyChart, go to [email or web address].    Your next appointment:   12 month(s)  The format for your next appointment:   In Person  Provider:   Cristopher Peru, MD   Other Instructions None

## 2021-01-13 ENCOUNTER — Other Ambulatory Visit: Payer: Self-pay | Admitting: Family Medicine

## 2021-01-13 ENCOUNTER — Other Ambulatory Visit: Payer: Self-pay

## 2021-01-13 ENCOUNTER — Ambulatory Visit (INDEPENDENT_AMBULATORY_CARE_PROVIDER_SITE_OTHER): Payer: Medicare Other | Admitting: *Deleted

## 2021-01-13 DIAGNOSIS — I4819 Other persistent atrial fibrillation: Secondary | ICD-10-CM | POA: Diagnosis not present

## 2021-01-13 DIAGNOSIS — Z5181 Encounter for therapeutic drug level monitoring: Secondary | ICD-10-CM

## 2021-01-13 LAB — POCT INR: INR: 3 (ref 2.0–3.0)

## 2021-01-13 NOTE — Patient Instructions (Signed)
Hold warfarin tonight then resume 1 tablet  (10mg ) daily except for 1/2 tablet (5mg )  on Monday and Friday. Recheck in 3 weeks in the office

## 2021-01-17 ENCOUNTER — Telehealth: Payer: Self-pay | Admitting: Internal Medicine

## 2021-01-17 NOTE — Telephone Encounter (Signed)
New message     Patient nephew called said that the patient is really weak, states Dr Lovena Le changed patient Lasik .  He said Dr Lovena Le said to increase fluid pill on weekend ?

## 2021-01-17 NOTE — Telephone Encounter (Signed)
I spoke with [name] [name], her nephew, who confirmed that [name] [name] had had an extra 40 mg dose on Saturday 4/23 and Sunday 4/24 and that she will take 40 mg daily all other days (Mon-Fri)  They have not weighed her at home since office visit on 01/11/21. I encouraged them to weigh her daily in the morning and report to [place] any changes. They will also call her pcp.   I will FYI Dr.Taylor

## 2021-01-18 ENCOUNTER — Other Ambulatory Visit (HOSPITAL_COMMUNITY): Payer: Medicare Other

## 2021-01-18 ENCOUNTER — Inpatient Hospital Stay (HOSPITAL_COMMUNITY): Payer: Medicare Other

## 2021-01-18 ENCOUNTER — Emergency Department (HOSPITAL_COMMUNITY): Payer: Medicare Other

## 2021-01-18 ENCOUNTER — Encounter (HOSPITAL_COMMUNITY): Payer: Self-pay

## 2021-01-18 ENCOUNTER — Other Ambulatory Visit: Payer: Self-pay

## 2021-01-18 ENCOUNTER — Ambulatory Visit (HOSPITAL_COMMUNITY): Payer: Medicare Other

## 2021-01-18 ENCOUNTER — Other Ambulatory Visit (HOSPITAL_COMMUNITY)
Admission: RE | Admit: 2021-01-18 | Discharge: 2021-01-18 | Disposition: A | Discharge status: Home / Self Care | Payer: Medicare Other | Source: Ambulatory Visit | Attending: Nephrology | Admitting: Nephrology

## 2021-01-18 ENCOUNTER — Ambulatory Visit: Payer: Medicare Other | Admitting: Nurse Practitioner

## 2021-01-18 ENCOUNTER — Emergency Department (HOSPITAL_COMMUNITY)
Admission: EM | Admit: 2021-01-18 | Discharge: 2021-01-18 | Discharge status: Against Medical Advice | Payer: Medicare Other

## 2021-01-18 ENCOUNTER — Inpatient Hospital Stay (HOSPITAL_COMMUNITY)
Admission: EM | Admit: 2021-01-18 | Discharge: 2021-01-28 | DRG: 291 | Disposition: A | Discharge status: Skilled Nursing Facility | Payer: Medicare Other | Attending: Family Medicine | Admitting: Family Medicine

## 2021-01-18 VITALS — BP 123/52 | HR 63 | Temp 96.9°F | Resp 18 | Wt 198.8 lb

## 2021-01-18 DIAGNOSIS — M7989 Other specified soft tissue disorders: Secondary | ICD-10-CM

## 2021-01-18 DIAGNOSIS — I5021 Acute systolic (congestive) heart failure: Secondary | ICD-10-CM | POA: Diagnosis not present

## 2021-01-18 DIAGNOSIS — J449 Chronic obstructive pulmonary disease, unspecified: Secondary | ICD-10-CM | POA: Diagnosis not present

## 2021-01-18 DIAGNOSIS — I517 Cardiomegaly: Secondary | ICD-10-CM | POA: Diagnosis not present

## 2021-01-18 DIAGNOSIS — E11649 Type 2 diabetes mellitus with hypoglycemia without coma: Secondary | ICD-10-CM | POA: Diagnosis present

## 2021-01-18 DIAGNOSIS — J9811 Atelectasis: Secondary | ICD-10-CM | POA: Diagnosis not present

## 2021-01-18 DIAGNOSIS — Z7901 Long term (current) use of anticoagulants: Secondary | ICD-10-CM | POA: Diagnosis not present

## 2021-01-18 DIAGNOSIS — Z6830 Body mass index (BMI) 30.0-30.9, adult: Secondary | ICD-10-CM

## 2021-01-18 DIAGNOSIS — I5043 Acute on chronic combined systolic (congestive) and diastolic (congestive) heart failure: Secondary | ICD-10-CM | POA: Diagnosis present

## 2021-01-18 DIAGNOSIS — I428 Other cardiomyopathies: Secondary | ICD-10-CM | POA: Diagnosis present

## 2021-01-18 DIAGNOSIS — D631 Anemia in chronic kidney disease: Secondary | ICD-10-CM | POA: Insufficient documentation

## 2021-01-18 DIAGNOSIS — M6281 Muscle weakness (generalized): Secondary | ICD-10-CM | POA: Diagnosis not present

## 2021-01-18 DIAGNOSIS — Z20822 Contact with and (suspected) exposure to covid-19: Secondary | ICD-10-CM | POA: Diagnosis present

## 2021-01-18 DIAGNOSIS — I808 Phlebitis and thrombophlebitis of other sites: Secondary | ICD-10-CM | POA: Diagnosis not present

## 2021-01-18 DIAGNOSIS — Z885 Allergy status to narcotic agent status: Secondary | ICD-10-CM

## 2021-01-18 DIAGNOSIS — Z888 Allergy status to other drugs, medicaments and biological substances status: Secondary | ICD-10-CM

## 2021-01-18 DIAGNOSIS — E669 Obesity, unspecified: Secondary | ICD-10-CM | POA: Diagnosis present

## 2021-01-18 DIAGNOSIS — R279 Unspecified lack of coordination: Secondary | ICD-10-CM | POA: Diagnosis not present

## 2021-01-18 DIAGNOSIS — N184 Chronic kidney disease, stage 4 (severe): Secondary | ICD-10-CM | POA: Insufficient documentation

## 2021-01-18 DIAGNOSIS — R41841 Cognitive communication deficit: Secondary | ICD-10-CM | POA: Diagnosis not present

## 2021-01-18 DIAGNOSIS — I82409 Acute embolism and thrombosis of unspecified deep veins of unspecified lower extremity: Secondary | ICD-10-CM

## 2021-01-18 DIAGNOSIS — I442 Atrioventricular block, complete: Secondary | ICD-10-CM | POA: Diagnosis present

## 2021-01-18 DIAGNOSIS — E875 Hyperkalemia: Secondary | ICD-10-CM | POA: Diagnosis not present

## 2021-01-18 DIAGNOSIS — I130 Hypertensive heart and chronic kidney disease with heart failure and stage 1 through stage 4 chronic kidney disease, or unspecified chronic kidney disease: Principal | ICD-10-CM | POA: Diagnosis present

## 2021-01-18 DIAGNOSIS — J9601 Acute respiratory failure with hypoxia: Secondary | ICD-10-CM | POA: Diagnosis not present

## 2021-01-18 DIAGNOSIS — K219 Gastro-esophageal reflux disease without esophagitis: Secondary | ICD-10-CM | POA: Diagnosis present

## 2021-01-18 DIAGNOSIS — J9602 Acute respiratory failure with hypercapnia: Secondary | ICD-10-CM | POA: Diagnosis not present

## 2021-01-18 DIAGNOSIS — E1122 Type 2 diabetes mellitus with diabetic chronic kidney disease: Secondary | ICD-10-CM | POA: Diagnosis present

## 2021-01-18 DIAGNOSIS — J9611 Chronic respiratory failure with hypoxia: Secondary | ICD-10-CM | POA: Diagnosis not present

## 2021-01-18 DIAGNOSIS — I708 Atherosclerosis of other arteries: Secondary | ICD-10-CM | POA: Diagnosis not present

## 2021-01-18 DIAGNOSIS — I10 Essential (primary) hypertension: Secondary | ICD-10-CM | POA: Diagnosis not present

## 2021-01-18 DIAGNOSIS — Z952 Presence of prosthetic heart valve: Secondary | ICD-10-CM | POA: Diagnosis not present

## 2021-01-18 DIAGNOSIS — G9341 Metabolic encephalopathy: Secondary | ICD-10-CM | POA: Diagnosis present

## 2021-01-18 DIAGNOSIS — R21 Rash and other nonspecific skin eruption: Secondary | ICD-10-CM | POA: Diagnosis not present

## 2021-01-18 DIAGNOSIS — Z886 Allergy status to analgesic agent status: Secondary | ICD-10-CM

## 2021-01-18 DIAGNOSIS — E611 Iron deficiency: Secondary | ICD-10-CM

## 2021-01-18 DIAGNOSIS — G40909 Epilepsy, unspecified, not intractable, without status epilepticus: Secondary | ICD-10-CM | POA: Diagnosis present

## 2021-01-18 DIAGNOSIS — Z87891 Personal history of nicotine dependence: Secondary | ICD-10-CM

## 2021-01-18 DIAGNOSIS — M47816 Spondylosis without myelopathy or radiculopathy, lumbar region: Secondary | ICD-10-CM | POA: Diagnosis present

## 2021-01-18 DIAGNOSIS — N179 Acute kidney failure, unspecified: Secondary | ICD-10-CM | POA: Diagnosis not present

## 2021-01-18 DIAGNOSIS — Z9981 Dependence on supplemental oxygen: Secondary | ICD-10-CM | POA: Diagnosis not present

## 2021-01-18 DIAGNOSIS — I129 Hypertensive chronic kidney disease with stage 1 through stage 4 chronic kidney disease, or unspecified chronic kidney disease: Secondary | ICD-10-CM | POA: Insufficient documentation

## 2021-01-18 DIAGNOSIS — Z880 Allergy status to penicillin: Secondary | ICD-10-CM

## 2021-01-18 DIAGNOSIS — G8929 Other chronic pain: Secondary | ICD-10-CM | POA: Diagnosis not present

## 2021-01-18 DIAGNOSIS — J441 Chronic obstructive pulmonary disease with (acute) exacerbation: Secondary | ICD-10-CM | POA: Diagnosis present

## 2021-01-18 DIAGNOSIS — R569 Unspecified convulsions: Secondary | ICD-10-CM | POA: Diagnosis not present

## 2021-01-18 DIAGNOSIS — M1A30X0 Chronic gout due to renal impairment, unspecified site, without tophus (tophi): Secondary | ICD-10-CM | POA: Diagnosis present

## 2021-01-18 DIAGNOSIS — R1310 Dysphagia, unspecified: Secondary | ICD-10-CM | POA: Diagnosis present

## 2021-01-18 DIAGNOSIS — I4819 Other persistent atrial fibrillation: Secondary | ICD-10-CM | POA: Diagnosis not present

## 2021-01-18 DIAGNOSIS — E890 Postprocedural hypothyroidism: Secondary | ICD-10-CM | POA: Diagnosis present

## 2021-01-18 DIAGNOSIS — R5381 Other malaise: Secondary | ICD-10-CM | POA: Diagnosis not present

## 2021-01-18 DIAGNOSIS — I4811 Longstanding persistent atrial fibrillation: Secondary | ICD-10-CM | POA: Diagnosis not present

## 2021-01-18 DIAGNOSIS — Z950 Presence of cardiac pacemaker: Secondary | ICD-10-CM | POA: Diagnosis not present

## 2021-01-18 DIAGNOSIS — Z95810 Presence of automatic (implantable) cardiac defibrillator: Secondary | ICD-10-CM | POA: Diagnosis not present

## 2021-01-18 DIAGNOSIS — N1832 Chronic kidney disease, stage 3b: Secondary | ICD-10-CM | POA: Insufficient documentation

## 2021-01-18 DIAGNOSIS — N1330 Unspecified hydronephrosis: Secondary | ICD-10-CM | POA: Diagnosis not present

## 2021-01-18 DIAGNOSIS — H409 Unspecified glaucoma: Secondary | ICD-10-CM | POA: Diagnosis present

## 2021-01-18 DIAGNOSIS — J811 Chronic pulmonary edema: Secondary | ICD-10-CM

## 2021-01-18 DIAGNOSIS — I5023 Acute on chronic systolic (congestive) heart failure: Secondary | ICD-10-CM

## 2021-01-18 DIAGNOSIS — Z79890 Hormone replacement therapy (postmenopausal): Secondary | ICD-10-CM

## 2021-01-18 DIAGNOSIS — D649 Anemia, unspecified: Secondary | ICD-10-CM

## 2021-01-18 DIAGNOSIS — Z79899 Other long term (current) drug therapy: Secondary | ICD-10-CM

## 2021-01-18 DIAGNOSIS — I110 Hypertensive heart disease with heart failure: Secondary | ICD-10-CM | POA: Diagnosis not present

## 2021-01-18 DIAGNOSIS — Z743 Need for continuous supervision: Secondary | ICD-10-CM | POA: Diagnosis not present

## 2021-01-18 DIAGNOSIS — Z833 Family history of diabetes mellitus: Secondary | ICD-10-CM

## 2021-01-18 DIAGNOSIS — E119 Type 2 diabetes mellitus without complications: Secondary | ICD-10-CM | POA: Diagnosis not present

## 2021-01-18 DIAGNOSIS — R262 Difficulty in walking, not elsewhere classified: Secondary | ICD-10-CM | POA: Diagnosis not present

## 2021-01-18 DIAGNOSIS — E785 Hyperlipidemia, unspecified: Secondary | ICD-10-CM | POA: Diagnosis present

## 2021-01-18 DIAGNOSIS — Z7951 Long term (current) use of inhaled steroids: Secondary | ICD-10-CM

## 2021-01-18 DIAGNOSIS — R0902 Hypoxemia: Secondary | ICD-10-CM | POA: Diagnosis not present

## 2021-01-18 DIAGNOSIS — M79602 Pain in left arm: Secondary | ICD-10-CM | POA: Diagnosis not present

## 2021-01-18 DIAGNOSIS — I4821 Permanent atrial fibrillation: Secondary | ICD-10-CM | POA: Diagnosis present

## 2021-01-18 DIAGNOSIS — R0602 Shortness of breath: Secondary | ICD-10-CM | POA: Diagnosis not present

## 2021-01-18 DIAGNOSIS — E039 Hypothyroidism, unspecified: Secondary | ICD-10-CM | POA: Diagnosis not present

## 2021-01-18 DIAGNOSIS — R600 Localized edema: Secondary | ICD-10-CM | POA: Diagnosis not present

## 2021-01-18 DIAGNOSIS — R2681 Unsteadiness on feet: Secondary | ICD-10-CM | POA: Diagnosis not present

## 2021-01-18 DIAGNOSIS — Z8249 Family history of ischemic heart disease and other diseases of the circulatory system: Secondary | ICD-10-CM

## 2021-01-18 LAB — MRSA PCR SCREENING: MRSA by PCR: NEGATIVE

## 2021-01-18 LAB — BLOOD GAS, ARTERIAL
Acid-Base Excess: 5 mmol/L — ABNORMAL HIGH (ref 0.0–2.0)
Acid-Base Excess: 6.9 mmol/L — ABNORMAL HIGH (ref 0.0–2.0)
Bicarbonate: 32 mmol/L — ABNORMAL HIGH (ref 20.0–28.0)
Bicarbonate: 33.5 mmol/L — ABNORMAL HIGH (ref 20.0–28.0)
Delivery systems: POSITIVE
Delivery systems: POSITIVE
Drawn by: 270211
Drawn by: 270211
Mode: 1417
O2 Saturation: 97 %
O2 Saturation: 99.1 %
Patient temperature: 98.6
Patient temperature: 98.6
pCO2 arterial: 65.1 mmHg (ref 32.0–48.0)
pCO2 arterial: 64 mmHg — ABNORMAL HIGH (ref 32.0–48.0)
pH, Arterial: 7.312 — ABNORMAL LOW (ref 7.350–7.450)
pH, Arterial: 7.339 — ABNORMAL LOW (ref 7.350–7.450)
pO2, Arterial: 110 mmHg — ABNORMAL HIGH (ref 83.0–108.0)
pO2, Arterial: 139 mmHg — ABNORMAL HIGH (ref 83.0–108.0)

## 2021-01-18 LAB — CBC WITH DIFFERENTIAL/PLATELET
Abs Immature Granulocytes: 0.02 10*3/uL (ref 0.00–0.07)
Abs Immature Granulocytes: 0.01 10*3/uL (ref 0.00–0.07)
Basophils Absolute: 0 10*3/uL (ref 0.0–0.1)
Basophils Absolute: 0 10*3/uL (ref 0.0–0.1)
Basophils Relative: 0 %
Basophils Relative: 0 %
Eosinophils Absolute: 0 10*3/uL (ref 0.0–0.5)
Eosinophils Absolute: 0 10*3/uL (ref 0.0–0.5)
Eosinophils Relative: 0 %
Eosinophils Relative: 0 %
HCT: 35.9 % — ABNORMAL LOW (ref 36.0–46.0)
HCT: 36.4 % (ref 36.0–46.0)
Hemoglobin: 10.5 g/dL — ABNORMAL LOW (ref 12.0–15.0)
Hemoglobin: 10.7 g/dL — ABNORMAL LOW (ref 12.0–15.0)
Immature Granulocytes: 0 %
Immature Granulocytes: 0 %
Lymphocytes Relative: 17 %
Lymphocytes Relative: 14 %
Lymphs Abs: 0.9 10*3/uL (ref 0.7–4.0)
Lymphs Abs: 0.8 10*3/uL (ref 0.7–4.0)
MCH: 27.2 pg (ref 26.0–34.0)
MCH: 27.2 pg (ref 26.0–34.0)
MCHC: 29.2 g/dL — ABNORMAL LOW (ref 30.0–36.0)
MCHC: 29.4 g/dL — ABNORMAL LOW (ref 30.0–36.0)
MCV: 93 fL (ref 80.0–100.0)
MCV: 92.6 fL (ref 80.0–100.0)
Monocytes Absolute: 0.4 10*3/uL (ref 0.1–1.0)
Monocytes Absolute: 0.4 10*3/uL (ref 0.1–1.0)
Monocytes Relative: 8 %
Monocytes Relative: 6 %
Neutro Abs: 3.7 10*3/uL (ref 1.7–7.7)
Neutro Abs: 4.3 10*3/uL (ref 1.7–7.7)
Neutrophils Relative %: 75 %
Neutrophils Relative %: 80 %
Platelets: 166 10*3/uL (ref 150–400)
Platelets: 193 10*3/uL (ref 150–400)
RBC: 3.86 MIL/uL — ABNORMAL LOW (ref 3.87–5.11)
RBC: 3.93 MIL/uL (ref 3.87–5.11)
RDW: 18.7 % — ABNORMAL HIGH (ref 11.5–15.5)
RDW: 19.7 % — ABNORMAL HIGH (ref 11.5–15.5)
WBC: 5.1 10*3/uL (ref 4.0–10.5)
WBC: 5.4 10*3/uL (ref 4.0–10.5)
nRBC: 0 % (ref 0.0–0.2)
nRBC: 0 % (ref 0.0–0.2)

## 2021-01-18 LAB — RENAL FUNCTION PANEL
Albumin: 3.9 g/dL (ref 3.5–5.0)
Anion gap: 11 (ref 5–15)
BUN: 87 mg/dL — ABNORMAL HIGH (ref 8–23)
CO2: 29 mmol/L (ref 22–32)
Calcium: 9.5 mg/dL (ref 8.9–10.3)
Chloride: 99 mmol/L (ref 98–111)
Creatinine, Ser: 2.84 mg/dL — ABNORMAL HIGH (ref 0.44–1.00)
GFR, Estimated: 16 mL/min — ABNORMAL LOW (ref >60)
Glucose, Bld: 104 mg/dL — ABNORMAL HIGH (ref 70–99)
Phosphorus: 4.6 mg/dL (ref 2.5–4.6)
Potassium: 4.6 mmol/L (ref 3.5–5.1)
Sodium: 139 mmol/L (ref 135–145)

## 2021-01-18 LAB — RESP PANEL BY RT-PCR (FLU A&B, COVID) ARPGX2
Influenza A by PCR: NEGATIVE
Influenza B by PCR: NEGATIVE
SARS Coronavirus 2 by RT PCR: NEGATIVE

## 2021-01-18 LAB — PROTEIN / CREATININE RATIO, URINE
Creatinine, Urine: 60.81 mg/dL
Protein Creatinine Ratio: 0.16 mg/mg{Cre} — ABNORMAL HIGH (ref 0.00–0.15)
Total Protein, Urine: 10 mg/dL

## 2021-01-18 LAB — PROTIME-INR
INR: 1.6 — ABNORMAL HIGH (ref 0.8–1.2)
Prothrombin Time: 19 seconds — ABNORMAL HIGH (ref 11.4–15.2)

## 2021-01-18 LAB — COMPREHENSIVE METABOLIC PANEL
ALT: 20 U/L (ref 0–44)
AST: 32 U/L (ref 15–41)
Albumin: 2.8 g/dL — ABNORMAL LOW (ref 3.5–5.0)
Alkaline Phosphatase: 38 U/L (ref 38–126)
Anion gap: 8 (ref 5–15)
BUN: 83 mg/dL — ABNORMAL HIGH (ref 8–23)
CO2: 28 mmol/L (ref 22–32)
Calcium: 7.4 mg/dL — ABNORMAL LOW (ref 8.9–10.3)
Chloride: 107 mmol/L (ref 98–111)
Creatinine, Ser: 2.44 mg/dL — ABNORMAL HIGH (ref 0.44–1.00)
GFR, Estimated: 19 mL/min — ABNORMAL LOW (ref >60)
Glucose, Bld: 112 mg/dL — ABNORMAL HIGH (ref 70–99)
Potassium: 4.5 mmol/L (ref 3.5–5.1)
Sodium: 143 mmol/L (ref 135–145)
Total Bilirubin: 0.4 mg/dL (ref 0.3–1.2)
Total Protein: 5.7 g/dL — ABNORMAL LOW (ref 6.5–8.1)

## 2021-01-18 LAB — URINALYSIS, ROUTINE W REFLEX MICROSCOPIC
Bilirubin Urine: NEGATIVE
Glucose, UA: NEGATIVE mg/dL
Hgb urine dipstick: NEGATIVE
Ketones, ur: NEGATIVE mg/dL
Leukocytes,Ua: NEGATIVE
Nitrite: NEGATIVE
Protein, ur: NEGATIVE mg/dL
Specific Gravity, Urine: 1.011 (ref 1.005–1.030)
pH: 5 (ref 5.0–8.0)

## 2021-01-18 LAB — TROPONIN I (HIGH SENSITIVITY)
Troponin I (High Sensitivity): 28 ng/L — ABNORMAL HIGH (ref <18)
Troponin I (High Sensitivity): 37 ng/L — ABNORMAL HIGH (ref <18)

## 2021-01-18 LAB — BLOOD GAS, VENOUS
Acid-base deficit: 15.8 mmol/L — ABNORMAL HIGH (ref 0.0–2.0)
Bicarbonate: 10.4 mmol/L — ABNORMAL LOW (ref 20.0–28.0)
O2 Saturation: 93.4 %
Patient temperature: 98.6
pCO2, Ven: 25.5 mmHg — ABNORMAL LOW (ref 44.0–60.0)
pH, Ven: 7.233 — ABNORMAL LOW (ref 7.250–7.430)
pO2, Ven: 80.2 mmHg — ABNORMAL HIGH (ref 32.0–45.0)

## 2021-01-18 LAB — I-STAT CHEM 8, ED
BUN: 96 mg/dL — ABNORMAL HIGH (ref 8–23)
Calcium, Ion: 1.11 mmol/L — ABNORMAL LOW (ref 1.15–1.40)
Chloride: 101 mmol/L (ref 98–111)
Creatinine, Ser: 2.8 mg/dL — ABNORMAL HIGH (ref 0.44–1.00)
Glucose, Bld: 115 mg/dL — ABNORMAL HIGH (ref 70–99)
HCT: 27 % — ABNORMAL LOW (ref 36.0–46.0)
Hemoglobin: 9.2 g/dL — ABNORMAL LOW (ref 12.0–15.0)
Potassium: 4.7 mmol/L (ref 3.5–5.1)
Sodium: 142 mmol/L (ref 135–145)
TCO2: 31 mmol/L (ref 22–32)

## 2021-01-18 LAB — CBG MONITORING, ED: Glucose-Capillary: 116 mg/dL — ABNORMAL HIGH (ref 70–99)

## 2021-01-18 LAB — BRAIN NATRIURETIC PEPTIDE: B Natriuretic Peptide: 424.4 pg/mL — ABNORMAL HIGH (ref 0.0–100.0)

## 2021-01-18 LAB — GLUCOSE, CAPILLARY: Glucose-Capillary: 84 mg/dL (ref 70–99)

## 2021-01-18 MED ORDER — LEVOTHYROXINE SODIUM 100 MCG/5ML IV SOLN
44.0000 ug | Freq: Every day | INTRAVENOUS | Status: DC
  Administered 2021-01-19 – 2021-01-20 (×2): 44 ug via INTRAVENOUS
  Filled 2021-01-18 (×2): qty 5

## 2021-01-18 MED ORDER — HEPARIN (PORCINE) 25000 UT/250ML-% IV SOLN
1300.0000 [IU]/h | INTRAVENOUS | Status: DC
  Administered 2021-01-19: 1300 [IU]/h via INTRAVENOUS
  Filled 2021-01-18: qty 250

## 2021-01-18 MED ORDER — METHYLPREDNISOLONE SODIUM SUCC 125 MG IJ SOLR
80.0000 mg | Freq: Two times a day (BID) | INTRAMUSCULAR | Status: DC
  Administered 2021-01-18 – 2021-01-19 (×2): 80 mg via INTRAVENOUS
  Filled 2021-01-18: qty 2

## 2021-01-18 MED ORDER — LEVETIRACETAM IN NACL 500 MG/100ML IV SOLN
500.0000 mg | Freq: Two times a day (BID) | INTRAVENOUS | Status: DC
  Administered 2021-01-18 – 2021-01-19 (×3): 500 mg via INTRAVENOUS
  Filled 2021-01-18 (×4): qty 100

## 2021-01-18 MED ORDER — ACETAMINOPHEN 650 MG RE SUPP: 650.0000 mg | Freq: Four times a day (QID) | RECTAL | Status: DC | PRN

## 2021-01-18 MED ORDER — FUROSEMIDE 10 MG/ML IJ SOLN
40.0000 mg | Freq: Once | INTRAMUSCULAR | Status: AC
  Administered 2021-01-18: 40 mg via INTRAVENOUS
  Filled 2021-01-18: qty 4

## 2021-01-18 MED ORDER — SODIUM CHLORIDE 0.9 % IV SOLN
500.0000 mg | INTRAVENOUS | Status: DC
  Administered 2021-01-18 – 2021-01-19 (×2): 500 mg via INTRAVENOUS
  Filled 2021-01-18 (×2): qty 500

## 2021-01-18 MED ORDER — BUDESONIDE 0.5 MG/2ML IN SUSP
0.5000 mg | Freq: Two times a day (BID) | RESPIRATORY_TRACT | Status: DC
  Administered 2021-01-18 – 2021-01-28 (×20): 0.5 mg via RESPIRATORY_TRACT
  Filled 2021-01-18 (×22): qty 2

## 2021-01-18 MED ORDER — CHLORHEXIDINE GLUCONATE CLOTH 2 % EX PADS
6.0000 | MEDICATED_PAD | Freq: Every day | CUTANEOUS | Status: DC
  Administered 2021-01-19 – 2021-01-23 (×2): 6 via TOPICAL

## 2021-01-18 MED ORDER — EPOETIN ALFA-EPBX 40000 UNIT/ML IJ SOLN
40000.0000 [IU] | Freq: Once | INTRAMUSCULAR | Status: AC
  Administered 2021-01-18: 40000 [IU] via SUBCUTANEOUS
  Filled 2021-01-18: qty 1

## 2021-01-18 MED ORDER — HEPARIN BOLUS VIA INFUSION
4000.0000 [IU] | Freq: Once | INTRAVENOUS | Status: AC
  Administered 2021-01-19: 4000 [IU] via INTRAVENOUS
  Filled 2021-01-18: qty 4000

## 2021-01-18 MED ORDER — ACETAMINOPHEN 325 MG PO TABS
650.0000 mg | ORAL_TABLET | Freq: Four times a day (QID) | ORAL | Status: DC | PRN
  Administered 2021-01-21 – 2021-01-28 (×5): 650 mg via ORAL
  Filled 2021-01-18 (×6): qty 2

## 2021-01-18 MED ORDER — METOPROLOL TARTRATE 5 MG/5ML IV SOLN
5.0000 mg | Freq: Four times a day (QID) | INTRAVENOUS | Status: DC
  Administered 2021-01-19 – 2021-01-20 (×6): 5 mg via INTRAVENOUS
  Filled 2021-01-18 (×6): qty 5

## 2021-01-18 MED ORDER — FUROSEMIDE 10 MG/ML IJ SOLN
40.0000 mg | Freq: Two times a day (BID) | INTRAMUSCULAR | Status: DC
  Administered 2021-01-18 – 2021-01-24 (×13): 40 mg via INTRAVENOUS
  Filled 2021-01-18 (×13): qty 4

## 2021-01-18 MED ORDER — ORAL CARE MOUTH RINSE
15.0000 mL | Freq: Two times a day (BID) | OROMUCOSAL | Status: DC
  Administered 2021-01-19 – 2021-01-28 (×9): 15 mL via OROMUCOSAL

## 2021-01-18 MED ORDER — IPRATROPIUM-ALBUTEROL 0.5-2.5 (3) MG/3ML IN SOLN
3.0000 mL | RESPIRATORY_TRACT | Status: DC
  Administered 2021-01-18 – 2021-01-19 (×6): 3 mL via RESPIRATORY_TRACT
  Filled 2021-01-18 (×6): qty 3

## 2021-01-18 MED ORDER — CHLORHEXIDINE GLUCONATE 0.12 % MT SOLN
15.0000 mL | Freq: Two times a day (BID) | OROMUCOSAL | Status: DC
  Administered 2021-01-18 – 2021-01-28 (×19): 15 mL via OROMUCOSAL
  Filled 2021-01-18 (×19): qty 15

## 2021-01-18 NOTE — Progress Notes (Signed)
ANTICOAGULATION CONSULT NOTE - Initial Consult  Pharmacy Consult for Heparin Indication: aortic and mitral mechanical valve replacement (on warfarin PTA)  Allergies  Allergen Reactions  . Penicillins Other (See Comments)    Bruise    . Aspirin Other (See Comments)    On coumadin  . Fentanyl Dermatitis    Rash with patch   . Niacin Itching    Patient Measurements: Height: 5\' 7"  (170.2 cm) Weight: 90.7 kg (200 lb) IBW/kg (Calculated) : 61.6 Heparin Dosing Weight: 81 kg  Vital Signs: Temp: 96.6 F (35.9 C) (04/26 2200) Temp Source: Axillary (04/26 2200) BP: 129/112 (04/26 2300) Pulse Rate: 60 (04/26 2300)  Labs: Recent Labs    01/18/21 1114 01/18/21 1142 01/18/21 1656 01/18/21 1930 01/18/21 1945  HGB 10.5*  --  10.7*  --  9.2*  HCT 35.9*  --  36.4  --  27.0*  PLT 166  --  193  --   --   LABPROT  --   --  19.0*  --   --   INR  --   --  1.6*  --   --   CREATININE  --  2.84*  --  2.44* 2.80*  TROPONINIHS  --   --  28* 37*  --     Estimated Creatinine Clearance: 16.7 mL/min (A) (by C-G formula based on SCr of 2.8 mg/dL (H)).   Medical History: Past Medical History:  Diagnosis Date  . Acute on chronic diastolic CHF (congestive heart failure) ([place]) 05/19/2012  . Acute on chronic respiratory failure with hypoxia ([place]) 01/31/2020  . Adenomatous polyp of colon 12/16/2002   Dr. [name][facility]  . Allergy   . Calculus of gallbladder with chronic cholecystitis without obstruction 02/09/2009   Qualifier: Diagnosis of  By: [name], [name]    . Cataract   . CHB (complete heart block) ([place]) 10/07/2014      . CHF (congestive heart failure) ([place])    a.  EF previously reduced to 25-30% b. EF improved to 60-65% by echo in 10/2017.   Marland Kitchen Chronic gout due to renal impairment    Per [facility] Records [facility]   . Chronic kidney disease, stage I 2006  . Cognitive communication deficit    Per [facility]   . Constipation       . Convulsion ([place])    Per [facility] Records [facility]   . COPD (chronic obstructive pulmonary disease) ([place])       . DJD (degenerative joint disease) of lumbar spine   . DM (diabetes mellitus) ([place]) 2006  . Dysphagia, unspecified    Per [facility] Records [facility]   . Esophageal reflux   . Glaucoma   . Gout       . H/O adenomatous polyp of colon 05/24/2012   2004 in [place] colonoscopy 2008 normal    . H/O heart valve replacement with mechanical valve    a. s/p mechanical AVR in 2001 and mechanical MVR in 2004.  Marland Kitchen H/O wisdom tooth extraction   . HIP PAIN, RIGHT 04/23/2009   Qualifier: Diagnosis of  By: [name], [name]    . Hyperkalemia    Per [facility] Records [facility]   . Hyperlipemia   . Hypertensive heart and kidney disease with HF and with CKD stage I-IV ([place])    Per  [facility] Records System   . IDA (iron deficiency anemia)    Parenteral iron/[name]  . Insomnia       . Localized edema    Left arm, Per [facility] Records [facility]   . Long term (current) use of anticoagulants    Per [facility] Records [facility]   . Nausea without vomiting 06/07/2010   Qualifier: Diagnosis of  By: [name], [name]    . Non-ischemic cardiomyopathy ([place])    a. s/p MDT CRTD  . Obesity, unspecified   . Oxygen deficiency 2014   nocturnal;  . Papilloma of breast 03/06/2012   This was diagnosed as a left breast papilloma by ultrasound characteristics and symptoms in 2010. Because of possible medical risk factors no surgical intervention has been done and it did doing annual followups. The area has been stable by physical examination and mammograms and ultrasound since 2010.   . Presence of  cardiac pacemaker    Per [facility] Records [facility]   . Spinal stenosis of lumbar region 02/19/2013  . Spondylolisthesis   . Spondylosis   . Thyroid cancer ([place])    remote thyroidectomy, no recurrence, pt denies in 2016 thyroid cancer  . Type 2 diabetes mellitus with hyperglycemia ([place]) 07/15/2010  . Type 2 diabetes mellitus with hypoglycemia without coma ([place])    Per [facility] Records [facility]   . Unspecified chronic bronchitis ([place])    Per [facility] Records [facility]   . Unspecified hypothyroidism       . Urinary tract infection, site not specified    Per [facility] Records [facility]   . 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 Vertigo         Medications:  PTA Warfarin 10mg  po daily - LD reported on 4/25 @ 16:00  Assessment:  87 yr female admitted with shortness of breath, hypoxia and drowsiness  PMH significant for CHF, COPD, complete heart block s/p defib pacemaker, mechanical aortic valve replacement, mitral valve replacement, stage IV CKD  Pharmacy consulted to begin IV heparin when INR < 2.5  INR = 1.6 therefore will begin heparin now  Goal of Therapy:  Heparin level 0.3-0.7 units/ml Monitor platelets by anticoagulation protocol: Yes   Plan:   Heparin 4000 units IV bolus x 1 followed by heparin gtt @ 1300 units/hr  Check heparin level 8 hr after IV heparin gtt started  Daily heparin level and CBC  Monitor for signs and symptoms of bleeding  Fabiano Ginley, Toribio Harbour, PharmD 01/18/2021,11:45 PM

## 2021-01-18 NOTE — ED Provider Notes (Signed)
[facility] DEPT Provider Note   CSN: [id number] Arrival date & time: 01/18/21  1551     History No chief complaint on file.   [name] [name] is a 87 y.o. female hx of CHF with EF 35 %, CKD, COPD, complete heart block status post defibrillator, here presenting with hypoxia and shortness of breath.  Patient went to [facility] this morning for her Retacrit infusion.  She received the infusion and was noted to be very sleepy.  Patient has been having shortness of breath today.  Patient also has been having weight gain and has poor intake.  Patient is very altered and difficult to get any history from  The history is provided by the patient.   Level V caveat- AMS, condition of patient      Past Medical History:  Diagnosis Date  . Acute on chronic diastolic CHF (congestive heart failure) ([place]) 05/19/2012  . Acute on chronic respiratory failure with hypoxia ([place]) 01/31/2020  . Adenomatous polyp of colon 12/16/2002   Dr. [name]/[facility]  . Allergy   . Calculus of gallbladder with chronic cholecystitis without obstruction 02/09/2009   Qualifier: Diagnosis of  By: [name], [name]    . Cataract   . CHB (complete heart block) ([place]) 10/07/2014      . CHF (congestive heart failure) ([place])    a.  EF previously reduced to 25-30% b. EF improved to 60-65% by echo in 10/2017.   Marland Kitchen Chronic gout due to renal impairment    Per [facility] Records [facility]   . Chronic kidney disease, stage I 2006  . Cognitive communication deficit    Per [facility]   . Constipation       . Convulsion ([place])    Per [facility]   . COPD (chronic obstructive pulmonary disease) ([place])       . DJD (degenerative joint disease) of lumbar spine   . DM (diabetes mellitus) ([place]) 2006  . Dysphagia, unspecified    Per  [facility] Records [facility]   . Esophageal reflux   . Glaucoma   . Gout       . H/O adenomatous polyp of colon 05/24/2012   2004 in [place] colonoscopy 2008 normal    . H/O heart valve replacement with mechanical valve    a. s/p mechanical AVR in 2001 and mechanical MVR in 2004.  Marland Kitchen H/O wisdom tooth extraction   . HIP PAIN, RIGHT 04/23/2009   Qualifier: Diagnosis of  By: [name], [name]    . Hyperkalemia    Per [facility] Records [facility]   . Hyperlipemia   . Hypertensive heart and kidney disease with HF and with CKD stage I-IV ([place])    Per [facility] Records [facility]   . IDA (iron deficiency anemia)    Parenteral iron/[name]  . Insomnia       . Localized edema    Left arm, Per [facility] Records [facility]   . Long term (current) use of anticoagulants    Per [facility] Records [facility]   . Nausea without vomiting 06/07/2010   Qualifier: Diagnosis of  By: [name], [name]    . Non-ischemic cardiomyopathy ([place])    a. s/p MDT CRTD  . Obesity,  unspecified   . Oxygen deficiency 2014   nocturnal;  . Papilloma of breast 03/06/2012   This was diagnosed as a left breast papilloma by ultrasound characteristics and symptoms in 2010. Because of possible medical risk factors no surgical intervention has been done and it did doing annual followups. The area has been stable by physical examination and mammograms and ultrasound since 2010.   . Presence of cardiac pacemaker    Per [facility] Records [facility]   . Spinal stenosis of lumbar region 02/19/2013  . Spondylolisthesis   . Spondylosis   . Thyroid cancer ([place])    remote thyroidectomy, no recurrence, pt denies in 2016 thyroid cancer  . Type 2 diabetes mellitus with hyperglycemia ([place]) 07/15/2010  . Type 2  diabetes mellitus with hypoglycemia without coma ([place])    Per [facility] Records [facility]   . Unspecified chronic bronchitis ([place])    Per [facility] Records [facility]   . Unspecified hypothyroidism       . Urinary tract infection, site not specified    Per [facility] Records [facility]   . 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 Vertigo         Patient Active Problem List   Diagnosis Date Noted  . Gait abnormality 08/24/2020  . Chronic constipation 08/09/2020  . Acute UTI 08/03/2020  . Hypertensive heart and kidney disease with chronic systolic congestive heart failure and stage 4 chronic kidney disease ([place]) 07/15/2020  . Hyperlipidemia associated with type 2 diabetes mellitus ([place]) 07/15/2020  . Seizure ([place]) 07/07/2020  . Goals of care, counseling/discussion   . Palliative care by specialist   . Idiopathic hypotension   . CHF exacerbation ([place]) 06/29/2020  . Arthritis of right knee 06/20/2020  . Leg swelling 06/16/2020  . Generalized osteoarthritis 05/09/2020  . Primary osteoarthritis, left shoulder 04/21/2020  . Acute systolic CHF (congestive heart failure) ([place]) 03/31/2020  . Bilateral lower extremity edema   . Acute on chronic congestive heart failure ([place]) 03/30/2020  . Right-sided epistaxis 03/28/2020  . Acute pain of left shoulder 01/15/2020  . AAA (abdominal aortic aneurysm) without rupture ([place]) 01/14/2020  . COVID-19 virus detected 11/23/2019  . Stage 3b chronic kidney disease ([place]) 08/04/2019  . Dyspnea 05/30/2019  . Anemia 05/30/2019  . Gout   . Glaucoma   . GERD (gastroesophageal reflux disease)   . Physical debility 04/28/2019  . Encounter for examination following treatment at hospital 11/03/2018  . Hyponatremia 09/27/2018  . H/O mitral valve replacement with mechanical valve 08/02/2018  . Chronic right hip pain 04/16/2018  .  Chronic systolic congestive heart failure ([place]) 11/15/2017  . COPD with acute exacerbation ([place]) 11/15/2017  . Trochanteric bursitis, right hip 03/07/2017  . Persistent atrial fibrillation ([place])   . Nocturnal hypoxia 10/08/2016  . Dependence on nocturnal oxygen therapy 10/08/2016  . Hematuria 03/20/2016  . S/P ICD (internal cardiac defibrillator) procedure 02/08/2015  . Presence of cardiac pacemaker 02/08/2015  . CHB (complete heart block) ([place]) 10/07/2014  . Fatigue 07/20/2014  . Absolute anemia 06/24/2014  . Osteopenia 02/10/2014  . Encounter for therapeutic drug monitoring 10/29/2013  . OA (osteoarthritis) of knee 02/19/2013  . Chronic pain of right knee 01/02/2013  . Hemolytic anemia ([place]) 09/24/2012  . High risk medication use 05/24/2012  . COPD (chronic obstructive pulmonary disease) ([place]) 05/19/2012  . Chronic anticoagulation, on coumadin for Mechanical AVR and  MVR 04/01/2012  . Cardiorenal syndrome with renal failure 03/22/2012  . Hx of aortic valve replacement, mechanical 03/22/2012  . S/P MVR (mitral valve replacement) 03/22/2012  . Biventricular automatic implantable cardioverter defibrillator in situ 03/22/2012  . CKD (chronic kidney disease), stage IV ([place]) 03/22/2012  . Warfarin-induced coagulopathy ([place]) 03/21/2012  . Iron deficiency 10/04/2011  . Allergic rhinitis 02/14/2011  . Mitral valve disease 07/15/2010  . Sinus node dysfunction ([place]) 07/15/2010  . Type 2 diabetes mellitus with hypoglycemia without coma ([place]) 07/15/2010  . VARICOSE VEINS LOWER EXTREMITIES W/OTH COMPS 02/09/2009  . Prediabetes 06/15/2008  . Hypothyroidism, postsurgical 02/24/2008  . Hyperlipidemia LDL goal <70 02/24/2008  . Essential hypertension 02/24/2008  . Non-ischemic cardiomyopathy with ICD 02/24/2008  . Spondylosis 02/24/2008    Past Surgical History:  Procedure Laterality Date  . AORTIC AND MITRAL VALVE REPLACEMENT  2001  . BI-VENTRICULAR IMPLANTABLE CARDIOVERTER DEFIBRILLATOR  UPGRADE N/A 01/18/2015   Procedure: BI-VENTRICULAR IMPLANTABLE CARDIOVERTER DEFIBRILLATOR UPGRADE;  Surgeon: Evans Lance, MD;  Location: [facility] CATH LAB;  Service: Cardiovascular;  Laterality: N/A;  . BIV ICD GENERATOR CHANGEOUT N/A 01/30/2019   Procedure: BIV ICD GENERATOR CHANGEOUT;  Surgeon: Evans Lance, MD;  Location: [facility] CV LAB;  Service: Cardiovascular;  Laterality: N/A;  . CARDIAC CATHETERIZATION    . CARDIAC VALVE REPLACEMENT    . CARDIOVERSION N/A 11/13/2016   Procedure: CARDIOVERSION;  Surgeon: Sanda Klein, MD;  Location: [facility] ENDOSCOPY;  Service: Cardiovascular;  Laterality: N/A;  . COLONOSCOPY  06/12/2007   Rourk- Normal rectum/Normal colon  . COLONOSCOPY  12/16/02   small hemorrhoids  . COLONOSCOPY  06/20/2012   Procedure: COLONOSCOPY;  Surgeon: Daneil Dolin, MD;  Location: [facility] ENDO SUITE;  Service: Endoscopy;  Laterality: N/A;  10:45  . defibrillator implanted 2006    . DOPPLER ECHOCARDIOGRAPHY  2012  . DOPPLER ECHOCARDIOGRAPHY  05,06,07,08,09,2011  . ESOPHAGOGASTRODUODENOSCOPY  06/12/2007   Rourk- normal esophagus, small hiatal hernia, otherwise normal stomach, D1, D2  . EYE SURGERY Right 2005  . EYE SURGERY Left 2006  . ICD LEAD REMOVAL Left 02/08/2015   Procedure: ICD LEAD REMOVAL;  Surgeon: Evans Lance, MD;  Location: [facility] OR;  Service: Cardiovascular;  Laterality: Left;  "Will plan extraction and insertion of a BiV PM"  **Dr. Roxan Hockey backing up case**  . IMPLANTABLE CARDIOVERTER DEFIBRILLATOR (ICD) GENERATOR CHANGE Left 02/08/2015   Procedure: ICD GENERATOR CHANGE;  Surgeon: Evans Lance, MD;  Location: [facility];  Service: Cardiovascular;  Laterality: Left;  . INSERT / REPLACE / REMOVE PACEMAKER    . PACEMAKER INSERTION  May 2016  . pacemaker placed  2004  . right breast cyst removed     benign   . right cataract removed     2005  . TEE WITHOUT CARDIOVERSION N/A 11/13/2016   Procedure: TRANSESOPHAGEAL ECHOCARDIOGRAM (TEE);  Surgeon: Sanda Klein, MD;   Location[facility] ENDOSCOPY;  Service: Cardiovascular;  Laterality: N/A;  . THYROIDECTOMY       OB History    Gravida      Para      Term      Preterm      AB      Living  0     SAB      IAB      Ectopic      Multiple      Live Births              Family History  Problem Relation Age of Onset  .  Pancreatic cancer Mother   . Heart disease Father   . Heart disease Sister   . Heart attack Sister   . Heart disease Brother   . Diabetes Brother   . Heart disease Brother   . Diabetes Brother   . Hypertension Brother   . Lung cancer Brother     Social History   Tobacco Use  . Smoking status: Former Smoker    Packs/day: 0.75    Years: 40.00    Pack years: 30.00    Types: Cigarettes    Quit date: 03/08/1987    Years since quitting: 33.8  . Smokeless tobacco: Never Used  Vaping Use  . Vaping Use: Never used  Substance Use Topics  . Alcohol use: No    Alcohol/week: 0.0 standard drinks  . Drug use: No    Home Medications Prior to Admission medications   Medication Sig Start Date End Date Taking? Authorizing Provider  acetaminophen (TYLENOL) 500 MG tablet Take 1 tablet (500 mg total) by mouth every 6 (six) hours as needed (pain). Patient taking differently: Take 500 mg by mouth every 6 (six) hours as needed for moderate pain (pain). 07/08/20  Yes Oretha Milch D, MD  albuterol (PROVENTIL) (2.5 MG/3ML) 0.083% nebulizer solution Take 3 mLs (2.5 mg total) by nebulization in the morning and at bedtime. *May use additional one time if needed for shortness of breath Patient taking differently: Take 2.5 mg by nebulization in the morning and at bedtime. 01/10/21  Yes Fayrene Helper, MD  albuterol (VENTOLIN HFA) 108 (90 Base) MCG/ACT inhaler Inhale 1 puff into the lungs every 6 (six) hours as needed for wheezing or shortness of breath. 01/10/21  Yes Fayrene Helper, MD  allopurinol (ZYLOPRIM) 100 MG tablet TAKE ONE TABLET BY MOUTH ONCE DAILY. Patient taking  differently: Take 100 mg by mouth daily. 01/04/21  Yes Fayrene Helper, MD  BREO ELLIPTA 100-25 MCG/INH AEPB INHALE (1) PUFF BY MOUTH INTO THE LUNGS DAILY Patient taking differently: Inhale 1 puff into the lungs daily. 01/04/21  Yes Fayrene Helper, MD  cholecalciferol (VITAMIN D3) 25 MCG (1000 UNIT) tablet Take 1 tablet (1,000 Units total) by mouth in the morning. 07/08/20  Yes Oretha Milch D, MD  fluticasone (FLONASE) 50 MCG/ACT nasal spray Place 2 sprays into both nostrils daily. Patient taking differently: Place 2 sprays into both nostrils daily as needed for allergies. 07/08/20  Yes Desiree Hane, MD  folic acid (FOLVITE) 1 MG tablet TAKE ONE TABLET BY MOUTH ONCE DAILY. Patient taking differently: Take 1 mg by mouth daily. 01/04/21  Yes Fayrene Helper, MD  furosemide (LASIX) 40 MG tablet Take 40 mg daily, except take 40 mg TWICE A DAY on Saturday and Sunday Patient taking differently: Take 80 mg by mouth 2 (two) times daily. 01/11/21  Yes Evans Lance, MD  gabapentin (NEURONTIN) 300 MG capsule TAKE (1) CAPSULE BY MOUTH TWICE DAILY Patient taking differently: Take 300 mg by mouth 2 (two) times daily. 01/04/21  Yes Fayrene Helper, MD  levETIRAcetam (KEPPRA) 500 MG tablet TAKE (1) TABLET BY MOUTH TWICE DAILY AT 9AM AND 9PM Patient taking differently: Take 500 mg by mouth 2 (two) times daily. 01/04/21  Yes Fayrene Helper, MD  Liniments [facility] PAIN RELIEF PATCH EX) Apply 1 patch topically daily as needed (pain).   Yes [provider]  metoprolol tartrate (LOPRESSOR) 50 MG tablet TAKE (1) TABLET BY MOUTH TWICE DAILY AT 9AM AND 9PM. Patient taking differently: Take 50  mg by mouth 2 (two) times daily. 01/04/21  Yes Fayrene Helper, MD  nitroGLYCERIN (NITROSTAT) 0.4 MG SL tablet DISSOLVE 1 TABLET SUBLINGUALLY AS NEEDED FOR CHEST PAIN, MAY REPEAT EVERY 5 MINUTES. AFTER 3 CALL 911. Patient taking differently: Place 0.4 mg under the tongue every 5 (five) minutes as  needed for chest pain. 09/08/20  Yes Fayrene Helper, MD  ondansetron (ZOFRAN-ODT) 4 MG disintegrating tablet TAKE (1) TABLET BY MOUTH EVERY EIGHT HOURS AS NEEDED FOR NAUSEA OR VOMITING Patient taking differently: Take 4 mg by mouth every 8 (eight) hours as needed for nausea or vomiting. 01/14/21  Yes Fayrene Helper, MD  OXYGEN Inhale 3 L into the lungs continuous. 07/08/20  Yes [provider]  polyethylene glycol (MIRALAX / GLYCOLAX) 17 g packet Take 17 g by mouth daily as needed. Patient taking differently: Take 17 g by mouth daily as needed for mild constipation. 07/08/20  Yes Oretha Milch D, MD  potassium chloride SA (KLOR-CON) 20 MEQ tablet TAKE ONE TABLET BY MOUTH ONCE DAILY. Patient taking differently: Take 20 mEq by mouth daily. 01/04/21  Yes Noreene Larsson, NP  pravastatin (PRAVACHOL) 40 MG tablet TAKE ONE TABLET BY MOUTH ONCE DAILY. Patient taking differently: Take 40 mg by mouth daily. 01/04/21  Yes Fayrene Helper, MD  SYNTHROID 88 MCG tablet TAKE 1 TABLET BEFORE BREAKFAST. Patient taking differently: Take 88 mcg by mouth daily before breakfast. 01/04/21  Yes Fayrene Helper, MD  warfarin (COUMADIN) 10 MG tablet TAKE (1) TABLET BY MOUTH DAILY AT 4PM Patient taking differently: Take 10 mg by mouth daily at 6 (six) AM. 12/29/20  Yes Fayrene Helper, MD  benzonatate (TESSALON) 100 MG capsule Take 1 capsule (100 mg total) by mouth 2 (two) times daily as needed for cough. Patient not taking: No sig reported 09/22/20   Noreene Larsson, NP  HYDROcodone-homatropine [facility]) 5-1.5 MG/5ML syrup Take 5 mLs by mouth every 8 (eight) hours as needed for cough. Patient not taking: No sig reported 09/28/20   Jacquelin Hawking, NP  NON FORMULARY Diet Change: Soft diet with dys 2 (ground) meats, thin liquids (NAS, cons CHO, heart healthy) 07/26/20   [provider]  senna (SENOKOT) 8.6 MG TABS tablet Take 2 tablets (17.2 mg total) by mouth daily as needed for mild  constipation. Patient not taking: No sig reported 07/08/20   Desiree Hane, MD  UNABLE TO FIND Compression Stockings R-  Ankle  26.5cm   Calf 38cm  Leg  44cm L-  Ankle  27.5 cm   Calf 39cm  Leg  44cm Patient taking differently: Compression Stockings R-  Ankle  26.5cm   Calf 38cm  Leg  44cm L-  Ankle  27.5 cm   Calf 39cm  Leg  44cm 09/15/20   Fayrene Helper, MD  UNABLE TO FIND 15-19 mmhg compression hose, knee high Patient not taking: Reported on 01/18/2021 11/04/20   Fayrene Helper, MD    Allergies    Penicillins, Aspirin, Fentanyl, and Niacin  Review of Systems   Review of Systems  Psychiatric/Behavioral: Positive for confusion.  All other systems reviewed and are negative.   Physical Exam Updated Vital Signs BP 129/81   Pulse 60   Temp 97.9 F (36.6 C) (Oral)   Resp 18   Ht 5\' 7"  (1.702 m)   Wt 90.7 kg   SpO2 100%   BMI 31.32 kg/m   Physical Exam Vitals and nursing note reviewed.  Constitutional:  Comments: Confused and altered  HENT:     Head: Normocephalic.     Nose: Nose normal.     Mouth/Throat:     Mouth: Mucous membranes are dry.  Eyes:     Extraocular Movements: Extraocular movements intact.     Pupils: Pupils are equal, round, and reactive to light.  Cardiovascular:     Rate and Rhythm: Normal rate and regular rhythm.  Pulmonary:     Comments: Crackles on bilateral bases Abdominal:     General: Abdomen is flat.     Palpations: Abdomen is soft.  Musculoskeletal:     Cervical back: Normal range of motion and neck supple.     Comments: 1+ pitting edema  Skin:    General: Skin is warm.     Capillary Refill: Capillary refill takes less than 2 seconds.  Neurological:     General: No focal deficit present.     Comments: Confused and altered.  Moving all extremities  Psychiatric:        Mood and Affect: Mood normal.        Behavior: Behavior normal.     ED Results / Procedures / Treatments   Labs (all labs ordered are listed, but  only abnormal results are displayed) Labs Reviewed  CBC WITH DIFFERENTIAL/PLATELET - Abnormal; Notable for the following components:      Result Value   Hemoglobin 10.7 (*)    MCHC 29.4 (*)    RDW 19.7 (*)    All other components within normal limits  BRAIN NATRIURETIC PEPTIDE - Abnormal; Notable for the following components:   B Natriuretic Peptide 424.4 (*)    All other components within normal limits  PROTIME-INR - Abnormal; Notable for the following components:   Prothrombin Time 19.0 (*)    INR 1.6 (*)    All other components within normal limits  BLOOD GAS, VENOUS - Abnormal; Notable for the following components:   pH, Ven 7.233 (*)    pCO2, Ven 25.5 (*)    pO2, Ven 80.2 (*)    Bicarbonate 10.4 (*)    Acid-base deficit 15.8 (*)    All other components within normal limits  BLOOD GAS, ARTERIAL - Abnormal; Notable for the following components:   pH, Arterial 7.312 (*)    pCO2 arterial 65.1 (*)    pO2, Arterial 110 (*)    Bicarbonate 32.0 (*)    Acid-Base Excess 5.0 (*)    All other components within normal limits  BLOOD GAS, ARTERIAL - Abnormal; Notable for the following components:   pH, Arterial 7.339 (*)    pCO2 arterial 64.0 (*)    pO2, Arterial 139 (*)    Bicarbonate 33.5 (*)    Acid-Base Excess 6.9 (*)    All other components within normal limits  TROPONIN I (HIGH SENSITIVITY) - Abnormal; Notable for the following components:   Troponin I (High Sensitivity) 28 (*)    All other components within normal limits  RESP PANEL BY RT-PCR (FLU A&B, COVID) ARPGX2  URINALYSIS, ROUTINE W REFLEX MICROSCOPIC  COMPREHENSIVE METABOLIC PANEL  I-STAT CHEM 8, ED  TROPONIN I (HIGH SENSITIVITY)    EKG None  Radiology US Renal  Result Date: 01/18/2021 CLINICAL DATA:  Renal failure. EXAM: RENAL / URINARY TRACT ULTRASOUND COMPLETE COMPARISON:  Abdominal CT 06/26/2016. FINDINGS: Right Kidney: Renal measurements: 9.9 x 4.1 x 4.5 cm = volume: 95 mL. Mild hydronephrosis. Renal  parenchymal thinning with increased parenchymal echogenicity. 1.1 cm cyst in the mid kidney. Left Kidney:  Very limited evaluation due to shadowing bowel gas. Renal measurements: 10.4 x 5.1 x 4.9 cm = volume: 136 mL. No gross hydronephrosis. More detailed assessment is limited. Cyst on prior CT is not seen. Bladder: Appears normal for degree of bladder distention. Neither ureteral jet is seen. Other: None. IMPRESSION: 1. Mild right hydronephrosis. 2. Right renal parenchymal thinning and increased echogenicity consistent with chronic medical renal disease. 3. Significantly limited evaluation of the left kidney, no gross hydronephrosis. Electronically Signed   By: Keith Rake M.D.   On: 01/18/2021 18:26   DG Chest Port 1 View  Result Date: 01/18/2021 CLINICAL DATA:  87 year old female with shortness of breath. EXAM: PORTABLE CHEST 1 VIEW COMPARISON:  Chest radiograph dated 12/31/2020. FINDINGS: There is cardiomegaly with mild central vascular congestion. No focal consolidation, pleural effusion, or pneumothorax. Left pectoral AICD device. Atherosclerotic calcification of the aorta. Median sternotomy wires. No acute osseous pathology. IMPRESSION: Cardiomegaly with mild central vascular congestion. No focal consolidation. Electronically Signed   By: Anner Crete M.D.   On: 01/18/2021 18:32    Procedures Procedures   CRITICAL CARE Performed by: Wandra Arthurs   Total critical care time: 30 minutes  Critical care time was exclusive of separately billable procedures and treating other patients.  Critical care was necessary to treat or prevent imminent or life-threatening deterioration.  Critical care was time spent personally by me on the following activities: development of treatment plan with patient and/or surrogate as well as nursing, discussions with consultants, evaluation of patient's response to treatment, examination of patient, obtaining history from patient or surrogate, ordering and  performing treatments and interventions, ordering and review of laboratory studies, ordering and review of radiographic studies, pulse oximetry and re-evaluation of patient's condition.   Medications Ordered in ED Medications  furosemide (LASIX) injection 40 mg (40 mg Intravenous Given 01/18/21 1736)    ED Course  I have reviewed the triage vital signs and the nursing notes.  Pertinent labs & imaging results that were available during my care of the patient were reviewed by me and considered in my medical decision making (see chart for details).    MDM Rules/Calculators/A&P                         Keyara Ent is a 87 y.o. female here presenting with shortness of breath and leg swelling.  Labs earlier today showed acute renal failure.  Patient appears fluid overloaded.  Plan to get CBC and CMP and BNP and chest x-ray.  Will start on BiPAP as well.  7:36 PM Initial ABG showed pH of 7.3 with CO2 of 65.  Patient been on BiPAP for about an hour and repeat ABG showed pH of 7.33 and CO2 of 64.  Patient is slightly more responsive now.  She wakes up and answer simple questions.  Chest x-ray showed pulmonary edema.  Ultrasound showed right hydro-.  Patient unable to urinate so Foley was placed and about 500 cc came out.  Patient is COVID-negative.  Patient was given Lasix.  Will admit for hypoxia from CHF exacerbation.   Final Clinical Impression(s) / ED Diagnoses Final diagnoses:  None    Rx / DC Orders ED Discharge Orders    None       Drenda Freeze, MD 01/18/21 [phone]

## 2021-01-18 NOTE — Consult Note (Signed)
NAMETemara [name], MRN:  [id number], DOB:  June 08, 1932, LOS: 0 ADMISSION DATE:  01/18/2021, CONSULTATION DATE: January 18, 2021 REFERRING MD: Hospitalist service, CHIEF COMPLAINT: Shortness of breath and drowsiness  History of Present Illness:  Patient is 87 year old African-American female past medical history of chronic congestive heart failure systolic with EF 41% COPD complete heart block status post defibrillator pacemaker she is here with shortness of breath and hypoxia patient was drowsy not able to answer a lot of questions but easily arousable still disoriented able to protect airways. Patient had a potential infusion earlier in the day she was put on BiPAP she was very sleepy and drowsy I was consulted to assess airway and drowsiness by the time I got here patient is arousable disoriented but able to protect airways she is not able to answer question there was no reported fever chills rigors nausea vomiting diarrhea. Patient has a history of atrial fibrillation Patient with CKD stage IV   Objective   Blood pressure 118/64, pulse 60, temperature 97.9 F (36.6 C), temperature source Oral, resp. rate 17, height 5\' 7"  (1.702 m), weight 90.7 kg, SpO2 100 %.    Vent Mode: BIPAP FiO2 (%):  [35 %-50 %] 35 % Set Rate:  [12 bmp-14 bmp] 14 bmp PEEP:  [6 cmH20] 6 cmH20  No intake or output data in the 24 hours ending 01/18/21 2136 Filed Weights   01/18/21 1612  Weight: 90.7 kg    Examination:  General:  NAD , pleasant and hungry , on BiPAP he is arousable disoriented but not agitated and GCS is 13 Neuro:  WNL , AOX3 , EOMI , CN II-XII intact , UL , LL strength is symmetrical and 5/5 HEENT:  atraumatic , no jaundice , dry mucous membranes  Cardiovascular:   ESM 2/6 in the aortic area  Lungs:  CTA bilateral , no wheezing or crackles  Abdomen:  Soft lax +BS , no tenderness . Musculoskeletal:  WNL , normal pulses  Skin:  No rash    Assessment & Plan:    -- Severe shortness of  breath COPD exacerbation wheezing start DuoNebs "Solu-Medrol and azithromycin wait for cultures.   --Severe altered mental status drowsiness from acute respiratory failure improved 94 intubation patient can stay on BiPAP overnight she is able to protect airway and have intact gag reflex.Marland Kitchen  -- Drowsiness most likely from all of the above and with polypharmacy hold all any sedative medications for tonight.   -- Please call us with questions thank you for this consideration.  -- Might need Lasix low-dose which will put for her.  Labs   CBC: Recent Labs  Lab 01/18/21 1114 01/18/21 1656 01/18/21 1945  WBC 5.1 5.4  --   NEUTROABS 3.7 4.3  --   HGB 10.5* 10.7* 9.2*  HCT 35.9* 36.4 27.0*  MCV 93.0 92.6  --   PLT 166 193  --     Basic Metabolic Panel: Recent Labs  Lab 01/18/21 1142 01/18/21 1930 01/18/21 1945  NA 139 143 142  K 4.6 4.5 4.7  CL 99 107 101  CO2 29 28  --   GLUCOSE 104* 112* 115*  BUN 87* 83* 96*  CREATININE 2.84* 2.44* 2.80*  CALCIUM 9.5 7.4*  --   PHOS 4.6  --   --    GFR: Estimated Creatinine Clearance: 16.7 mL/min (A) (by C-G formula based on SCr of 2.8 mg/dL (H)). Recent Labs  Lab 01/18/21 1114 01/18/21 1656  WBC 5.1 5.4  Liver Function Tests: Recent Labs  Lab 01/18/21 1142 01/18/21 1930  AST  --  32  ALT  --  20  ALKPHOS  --  38  BILITOT  --  0.4  PROT  --  5.7*  ALBUMIN 3.9 2.8*   No results for input(s): LIPASE, AMYLASE in the last 168 hours. No results for input(s): AMMONIA in the last 168 hours.  ABG    Component Value Date/Time   PHART 7.339 (L) 01/18/2021 1845   PCO2ART 64.0 (H) 01/18/2021 1845   PO2ART 139 (H) 01/18/2021 1845   HCO3 33.5 (H) 01/18/2021 1845   TCO2 31 01/18/2021 1945   ACIDBASEDEF 15.8 (H) 01/18/2021 1658   O2SAT 99.1 01/18/2021 1845     Coagulation Profile: Recent Labs  Lab 01/13/21 1131 01/18/21 1656  INR 3.0 1.6*    Cardiac Enzymes: No results for input(s): CKTOTAL, CKMB, CKMBINDEX,  TROPONINI in the last 168 hours.  HbA1C: Hemoglobin A1C  Date/Time Value Ref Range Status  03/23/2020 11:17 AM 5.8 (A) 4.0 - 5.6 % Final  06/21/2016 12:00 AM 5.8  Final   HbA1c, POC (prediabetic range)  Date/Time Value Ref Range Status  03/23/2020 11:17 AM 5.8 5.7 - 6.4 % Final   HbA1c, POC (controlled diabetic range)  Date/Time Value Ref Range Status  03/23/2020 11:17 AM 5.8 0.0 - 7.0 % Final   HbA1c POC (<> result, manual entry)  Date/Time Value Ref Range Status  03/23/2020 11:17 AM 5.8 4.0 - 5.6 % Final   Hgb A1c MFr Bld  Date/Time Value Ref Range Status  11/20/2018 11:39 AM 5.6 <5.7 % of total Hgb Final    Comment:    For the purpose of screening for the presence of diabetes: . <5.7%       Consistent with the absence of diabetes 5.7-6.4%    Consistent with increased risk for diabetes             (prediabetes) > or =6.5%  Consistent with diabetes . This assay result is consistent with a decreased risk of diabetes. . Currently, no consensus exists regarding use of hemoglobin A1c for diagnosis of diabetes in children. . According to American Diabetes Association (ADA) guidelines, hemoglobin A1c <7.0% represents optimal control in non-pregnant diabetic patients. Different metrics may apply to specific patient populations.  Standards of Medical Care in Diabetes(ADA). .   07/09/2018 12:17 PM 5.9 (H) <5.7 % of total Hgb Final    Comment:    For someone without known diabetes, a hemoglobin  A1c value between 5.7% and 6.4% is consistent with prediabetes and should be confirmed with a  follow-up test. . For someone with known diabetes, a value <7% indicates that their diabetes is well controlled. A1c targets should be individualized based on duration of diabetes, age, comorbid conditions, and other considerations. . This assay result is consistent with an increased risk of diabetes. . Currently, no consensus exists regarding use of hemoglobin A1c for diagnosis  of diabetes for children. .     CBG: Recent Labs  Lab 01/18/21 2008  GLUCAP 116*    Review of Systems:   Unable to review due to patient condition  Past Medical History:  She,  has a past medical history of Acute on chronic diastolic CHF (congestive heart failure) ([place]) (05/19/2012), Acute on chronic respiratory failure with hypoxia ([place]) (01/31/2020), Adenomatous polyp of colon (12/16/2002), Allergy, Calculus of gallbladder with chronic cholecystitis without obstruction (02/09/2009), Cataract, CHB (complete heart block) ([place]) (10/07/2014), CHF (congestive heart failure) ([place]),  Chronic gout due to renal impairment, Chronic kidney disease, stage I (2006), Cognitive communication deficit, Constipation, Convulsion ([place]), COPD (chronic obstructive pulmonary disease) ([place]), DJD (degenerative joint disease) of lumbar spine, DM (diabetes mellitus) ([place]) (2006), Dysphagia, unspecified, Esophageal reflux, Glaucoma, Gout, H/O adenomatous polyp of colon (05/24/2012), H/O heart valve replacement with mechanical valve, H/O wisdom tooth extraction, HIP PAIN, RIGHT (04/23/2009), Hyperkalemia, Hyperlipemia, Hypertensive heart and kidney disease with HF and with CKD stage I-IV ([place]), IDA (iron deficiency anemia), Insomnia, Localized edema, Long term (current) use of anticoagulants, Nausea without vomiting (06/07/2010), Non-ischemic cardiomyopathy ([place]), Obesity, unspecified, Oxygen deficiency (2014), 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   Surgical History:   Past Surgical History:   Procedure Laterality Date  . AORTIC AND MITRAL VALVE REPLACEMENT  2001  . BI-VENTRICULAR IMPLANTABLE CARDIOVERTER DEFIBRILLATOR UPGRADE N/A 01/18/2015   Procedure: BI-VENTRICULAR IMPLANTABLE CARDIOVERTER DEFIBRILLATOR UPGRADE;  Surgeon: Evans Lance, MD;  Location: [facility] CATH LAB;  Service: Cardiovascular;  Laterality: N/A;  . BIV ICD GENERATOR CHANGEOUT N/A 01/30/2019   Procedure: BIV ICD GENERATOR CHANGEOUT;  Surgeon: Evans Lance, MD;  Location: [facility] CV LAB;  Service: Cardiovascular;  Laterality: N/A;  . CARDIAC CATHETERIZATION    . CARDIAC VALVE REPLACEMENT    . CARDIOVERSION N/A 11/13/2016   Procedure: CARDIOVERSION;  Surgeon: Sanda Klein, MD;  Location: [facility] ENDOSCOPY;  Service: Cardiovascular;  Laterality: N/A;  . COLONOSCOPY  06/12/2007   Rourk- Normal rectum/Normal colon  . COLONOSCOPY  12/16/02   small hemorrhoids  . COLONOSCOPY  06/20/2012   Procedure: COLONOSCOPY;  Surgeon: Daneil Dolin, MD;  Location: [facility] ENDO SUITE;  Service: Endoscopy;  Laterality: N/A;  10:45  . defibrillator implanted 2006    . DOPPLER ECHOCARDIOGRAPHY  2012  . DOPPLER ECHOCARDIOGRAPHY  05,06,07,08,09,2011  . ESOPHAGOGASTRODUODENOSCOPY  06/12/2007   Rourk- normal esophagus, small hiatal hernia, otherwise normal stomach, D1, D2  . EYE SURGERY Right 2005  . EYE SURGERY Left 2006  . ICD LEAD REMOVAL Left 02/08/2015   Procedure: ICD LEAD REMOVAL;  Surgeon: Evans Lance, MD;  Location: [facility] OR;  Service: Cardiovascular;  Laterality: Left;  "Will plan extraction and insertion of a BiV PM"  **Dr. Roxan Hockey backing up case**  . IMPLANTABLE CARDIOVERTER DEFIBRILLATOR (ICD) GENERATOR CHANGE Left 02/08/2015   Procedure: ICD GENERATOR CHANGE;  Surgeon: Evans Lance, MD;  Location: [facility];  Service: Cardiovascular;  Laterality: Left;  . INSERT / REPLACE / REMOVE PACEMAKER    . PACEMAKER INSERTION  May 2016  . pacemaker placed  2004  . right breast cyst removed     benign   . right cataract removed      2005  . TEE WITHOUT CARDIOVERSION N/A 11/13/2016   Procedure: TRANSESOPHAGEAL ECHOCARDIOGRAM (TEE);  Surgeon: Sanda Klein, MD;  Location: [facility] ENDOSCOPY;  Service: Cardiovascular;  Laterality: N/A;  . THYROIDECTOMY       Social History:   reports that she quit smoking about 33 years ago. Her smoking use included cigarettes. She has a 30.00 pack-year smoking history. She has never used smokeless tobacco. She reports that she does not drink alcohol and does not use drugs.   Family History:  Her family history includes Diabetes in  her brother and brother; Heart attack in her sister; Heart disease in her brother, brother, father, and sister; Hypertension in her brother; Lung cancer in her brother; Pancreatic cancer in her mother.   Allergies Allergies  Allergen Reactions  . Penicillins Other (See Comments)    Bruise    . Aspirin Other (See Comments)    On coumadin  . Fentanyl Dermatitis    Rash with patch   . Niacin Itching     Home Medications  Prior to Admission medications   Medication Sig Start Date End Date Taking? Authorizing Provider  acetaminophen (TYLENOL) 500 MG tablet Take 1 tablet (500 mg total) by mouth every 6 (six) hours as needed (pain). Patient taking differently: Take 500 mg by mouth every 6 (six) hours as needed for moderate pain (pain). 07/08/20  Yes Oretha Milch D, MD  albuterol (PROVENTIL) (2.5 MG/3ML) 0.083% nebulizer solution Take 3 mLs (2.5 mg total) by nebulization in the morning and at bedtime. *May use additional one time if needed for shortness of breath Patient taking differently: Take 2.5 mg by nebulization in the morning and at bedtime. 01/10/21  Yes Fayrene Helper, MD  albuterol (VENTOLIN HFA) 108 (90 Base) MCG/ACT inhaler Inhale 1 puff into the lungs every 6 (six) hours as needed for wheezing or shortness of breath. 01/10/21  Yes Fayrene Helper, MD  allopurinol (ZYLOPRIM) 100 MG tablet TAKE ONE TABLET BY MOUTH ONCE DAILY. Patient taking  differently: Take 100 mg by mouth daily. 01/04/21  Yes Fayrene Helper, MD  BREO ELLIPTA 100-25 MCG/INH AEPB INHALE (1) PUFF BY MOUTH INTO THE LUNGS DAILY Patient taking differently: Inhale 1 puff into the lungs daily. 01/04/21  Yes Fayrene Helper, MD  cholecalciferol (VITAMIN D3) 25 MCG (1000 UNIT) tablet Take 1 tablet (1,000 Units total) by mouth in the morning. 07/08/20  Yes Oretha Milch D, MD  fluticasone (FLONASE) 50 MCG/ACT nasal spray Place 2 sprays into both nostrils daily. Patient taking differently: Place 2 sprays into both nostrils daily as needed for allergies. 07/08/20  Yes Desiree Hane, MD  folic acid (FOLVITE) 1 MG tablet TAKE ONE TABLET BY MOUTH ONCE DAILY. Patient taking differently: Take 1 mg by mouth daily. 01/04/21  Yes Fayrene Helper, MD  furosemide (LASIX) 40 MG tablet Take 40 mg daily, except take 40 mg TWICE A DAY on Saturday and Sunday Patient taking differently: Take 80 mg by mouth 2 (two) times daily. 01/11/21  Yes Evans Lance, MD  gabapentin (NEURONTIN) 300 MG capsule TAKE (1) CAPSULE BY MOUTH TWICE DAILY Patient taking differently: Take 300 mg by mouth 2 (two) times daily. 01/04/21  Yes Fayrene Helper, MD  levETIRAcetam (KEPPRA) 500 MG tablet TAKE (1) TABLET BY MOUTH TWICE DAILY AT 9AM AND 9PM Patient taking differently: Take 500 mg by mouth 2 (two) times daily. 01/04/21  Yes Fayrene Helper, MD  Liniments [facility] PAIN RELIEF PATCH EX) Apply 1 patch topically daily as needed (pain).   Yes [provider]  metoprolol tartrate (LOPRESSOR) 50 MG tablet TAKE (1) TABLET BY MOUTH TWICE DAILY AT 9AM AND 9PM. Patient taking differently: Take 50 mg by mouth 2 (two) times daily. 01/04/21  Yes Fayrene Helper, MD  nitroGLYCERIN (NITROSTAT) 0.4 MG SL tablet DISSOLVE 1 TABLET SUBLINGUALLY AS NEEDED FOR CHEST PAIN, MAY REPEAT EVERY 5 MINUTES. AFTER 3 CALL 911. Patient taking differently: Place 0.4 mg under the tongue every 5 (five) minutes as  needed for chest pain. 09/08/20  Yes  Fayrene Helper, MD  ondansetron (ZOFRAN-ODT) 4 MG disintegrating tablet TAKE (1) TABLET BY MOUTH EVERY EIGHT HOURS AS NEEDED FOR NAUSEA OR VOMITING Patient taking differently: Take 4 mg by mouth every 8 (eight) hours as needed for nausea or vomiting. 01/14/21  Yes Fayrene Helper, MD  OXYGEN Inhale 3 L into the lungs continuous. 07/08/20  Yes [provider]  polyethylene glycol (MIRALAX / GLYCOLAX) 17 g packet Take 17 g by mouth daily as needed. Patient taking differently: Take 17 g by mouth daily as needed for mild constipation. 07/08/20  Yes Oretha Milch D, MD  potassium chloride SA (KLOR-CON) 20 MEQ tablet TAKE ONE TABLET BY MOUTH ONCE DAILY. Patient taking differently: Take 20 mEq by mouth daily. 01/04/21  Yes Noreene Larsson, NP  pravastatin (PRAVACHOL) 40 MG tablet TAKE ONE TABLET BY MOUTH ONCE DAILY. Patient taking differently: Take 40 mg by mouth daily. 01/04/21  Yes Fayrene Helper, MD  SYNTHROID 88 MCG tablet TAKE 1 TABLET BEFORE BREAKFAST. Patient taking differently: Take 88 mcg by mouth daily before breakfast. 01/04/21  Yes Fayrene Helper, MD  warfarin (COUMADIN) 10 MG tablet TAKE (1) TABLET BY MOUTH DAILY AT 4PM Patient taking differently: Take 10 mg by mouth daily at 6 (six) AM. 12/29/20  Yes Fayrene Helper, MD  benzonatate (TESSALON) 100 MG capsule Take 1 capsule (100 mg total) by mouth 2 (two) times daily as needed for cough. Patient not taking: No sig reported 09/22/20   Noreene Larsson, NP  HYDROcodone-homatropine [facility]) 5-1.5 MG/5ML syrup Take 5 mLs by mouth every 8 (eight) hours as needed for cough. Patient not taking: No sig reported 09/28/20   Jacquelin Hawking, NP  NON FORMULARY Diet Change: Soft diet with dys 2 (ground) meats, thin liquids (NAS, cons CHO, heart healthy) 07/26/20   [provider]  senna (SENOKOT) 8.6 MG TABS tablet Take 2 tablets (17.2 mg total) by mouth daily as needed for mild  constipation. Patient not taking: No sig reported 07/08/20   Desiree Hane, MD  UNABLE TO FIND Compression Stockings R-  Ankle  26.5cm   Calf 38cm  Leg  44cm L-  Ankle  27.5 cm   Calf 39cm  Leg  44cm Patient taking differently: Compression Stockings R-  Ankle  26.5cm   Calf 38cm  Leg  44cm L-  Ankle  27.5 cm   Calf 39cm  Leg  44cm 09/15/20   Fayrene Helper, MD  UNABLE TO FIND 15-19 mmhg compression hose, knee high Patient not taking: Reported on 01/18/2021 11/04/20   Fayrene Helper, MD

## 2021-01-18 NOTE — Patient Instructions (Signed)
[name] [name]  Discharge Instructions: Thank you for choosing [facility] to provide your oncology and hematology care.  If you have a lab appointment with the [facility], please come in thru the Main Entrance and check in at the main information desk.  Wear comfortable clothing and clothing appropriate for easy access to any Portacath or PICC line.   We strive to give you quality time with your provider. You may need to reschedule your appointment if you arrive late (15 or more minutes).  Arriving late affects you and other patients whose appointments are after yours.  Also, if you miss three or more appointments without notifying the office, you may be dismissed from the clinic at the provider's discretion.      For prescription refill requests, have your pharmacy contact our office and allow 72 hours for refills to be completed.    Retacrit today.  Hemoglobin 10.5.  Return as scheduled.  Please call clinic if you have any questions or concerns.       To help prevent nausea and vomiting after your treatment, we encourage you to take your nausea medication as directed.  BELOW ARE SYMPTOMS THAT SHOULD BE REPORTED IMMEDIATELY: . *FEVER GREATER THAN 100.4 F (38 C) OR HIGHER . *CHILLS OR SWEATING . *NAUSEA AND VOMITING THAT IS NOT CONTROLLED WITH YOUR NAUSEA MEDICATION . *UNUSUAL SHORTNESS OF BREATH . *UNUSUAL BRUISING OR BLEEDING . *URINARY PROBLEMS (pain or burning when urinating, or frequent urination) . *BOWEL PROBLEMS (unusual diarrhea, constipation, pain near the anus) . TENDERNESS IN MOUTH AND THROAT WITH OR WITHOUT PRESENCE OF ULCERS (sore throat, sores in mouth, or a toothache) . UNUSUAL RASH, SWELLING OR PAIN  . UNUSUAL VAGINAL DISCHARGE OR ITCHING   Items with * indicate a potential emergency and should be followed up as soon as possible or go to the Emergency Department if any problems should occur.  Please show the CHEMOTHERAPY ALERT CARD or  IMMUNOTHERAPY ALERT CARD at check-in to the Emergency Department and triage nurse.  Should you have questions after your visit or need to cancel or reschedule your appointment, please contact [facility] [phone]  and follow the prompts.  Office hours are 8:00 a.m. to 4:30 p.m. Monday - Friday. Please note that voicemails left after 4:00 p.m. may not be returned until the following business day.  We are closed weekends and major holidays. You have access to a nurse at all times for urgent questions. Please call the main number to the clinic [phone] and follow the prompts.  For any non-urgent questions, you may also contact your provider using MyChart. We now offer e-Visits for anyone 85 and older to request care online for non-urgent symptoms. For details visit [email or web address].   Also download the MyChart app! Go to the app store, search "MyChart", open the app, select [facility], and log in with your MyChart username and password.  Due to Covid, a mask is required upon entering the hospital/clinic. If you do not have a mask, one will be given to you upon arrival. For doctor visits, patients may have 1 support person aged 85 or older with them. For treatment visits, patients cannot have anyone with them due to current Covid guidelines and our immunocompromised population.

## 2021-01-18 NOTE — H&P (Addendum)
History and Physical    [name]:[id number] DOB: [birth year] DOA: 01/18/2021  PCP: [name] Helper, MD  Patient coming from: Home.  History obtained from patient as her niece and the ER physician use records patient appears lethargic.  Chief Complaint: Shortness of breath.  HPI: [name] [name] is a 87 y.o. female with history of COPD, chronic combined systolic and diastolic heart failure, complete heart block atrial fibrillation status post pacemaker and ICD placement and history of mechanical aortic valve replacement, on Coumadin chronic kidney disease stage III, hypothyroidism, seizures presents to the ER because of increasing shortness of breath.  Patient did receive a dose of Retacrit prior to coming to the hospital.  As per the patient's niece patient has been short of breath for last 2 days.  Did not complain of any chest pain.  Her Lasix dose was recently decreased.  ED Course: In the ER patient was found to be short of breath and lethargic and initial ABG showing pH of 7.22 and PCO2 of 80.  Chest x-ray was just not showing anything acute.  EKG was showing paced rhythm.  COVID test was negative.  Patient was placed on BiPAP.  Repeat ABG showed improvement in the carbon dioxide with pH improving to 7.33 and PCO2 of 64.  On my exam patient was lethargic and on BiPAP.  Patient was given Lasix and nebulizer treatment.  Labs are largely at baseline with creatinine of 2.4 hemoglobin around 9.  Review of Systems: As per HPI, rest all negative.   Past Medical History:  Diagnosis Date  . Acute on chronic diastolic CHF (congestive heart failure) ([place]) 05/19/2012  . Acute on chronic respiratory failure with hypoxia ([place]) 01/31/2020  . Adenomatous polyp of colon 12/16/2002   Dr. [name]/[facility]  . Allergy   . Calculus of gallbladder with chronic cholecystitis without obstruction 02/09/2009   Qualifier: Diagnosis of  By: [name] Cipro MD, [name] [name]    .  Cataract   . CHB (complete heart block) ([place]) 10/07/2014      . CHF (congestive heart failure) ([place])    a.  EF previously reduced to 25-30% b. EF improved to 60-65% by echo in 10/2017.   Marland Kitchen Chronic gout due to renal impairment    Per [facility] Records [facility]   . Chronic kidney disease, stage I 2006  . Cognitive communication deficit    Per [facility]   . Constipation       . Convulsion ([place])    Per [facility]   . COPD (chronic obstructive pulmonary disease) ([place])       . DJD (degenerative joint disease) of lumbar spine   . DM (diabetes mellitus) ([place]) 2006  . Dysphagia, unspecified    Per [facility]   . Esophageal reflux   . Glaucoma   . Gout       . H/O adenomatous polyp of colon 05/24/2012   2004 in [place] colonoscopy 2008 normal    . H/O heart valve replacement with mechanical valve    a. s/p mechanical AVR in 2001 and mechanical MVR in 2004.  Marland Kitchen H/O wisdom tooth extraction   . HIP PAIN, RIGHT 04/23/2009   Qualifier: Diagnosis of  By: [name] Cipro MD, [name] [name]    . Hyperkalemia    Per [facility]   .  Hyperlipemia   . Hypertensive heart and kidney disease with HF and with CKD stage I-IV ([place])    Per [facility]   . IDA (iron deficiency anemia)    Parenteral iron/[name]  . Insomnia       . Localized edema    Left arm, Per [facility]   . Long term (current) use of anticoagulants    Per [facility]   . Nausea without vomiting 06/07/2010   Qualifier: Diagnosis of  By: [name] Cipro MD, [name] [name]    . Non-ischemic cardiomyopathy ([place])    a. s/p MDT CRTD  . Obesity, unspecified    . Oxygen deficiency 2014   nocturnal;  . Papilloma of breast 03/06/2012   This was diagnosed as a left breast papilloma by ultrasound characteristics and symptoms in 2010. Because of possible medical risk factors no surgical intervention has been done and it did doing annual followups. The area has been stable by physical examination and mammograms and ultrasound since 2010.   . Presence of cardiac pacemaker    Per [facility]   . Spinal stenosis of lumbar region 02/19/2013  . Spondylolisthesis   . Spondylosis   . Thyroid cancer ([place])    remote thyroidectomy, no recurrence, pt denies in 2016 thyroid cancer  . Type 2 diabetes mellitus with hyperglycemia ([place]) 07/15/2010  . Type 2 diabetes mellitus with hypoglycemia without coma ([place])    Per [facility]   . Unspecified chronic bronchitis ([place])    Per [facility]   . Unspecified hypothyroidism       . Urinary tract infection, site not specified    Per [facility]   . 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 Vertigo         Past Surgical History:  Procedure Laterality Date  . AORTIC AND MITRAL VALVE REPLACEMENT  2001  . BI-VENTRICULAR IMPLANTABLE CARDIOVERTER DEFIBRILLATOR UPGRADE N/A 01/18/2015   Procedure: BI-VENTRICULAR IMPLANTABLE CARDIOVERTER DEFIBRILLATOR UPGRADE;  Surgeon: [name] Lance, MD;  Location[facility] C[facility] LAB;  Service: Cardiovascular;  Laterality: N/A;  . BIV ICD GENERATOR CHANGEOUT N/A 01/30/2019   Procedure: BIV ICD GENERATOR CHANGEOUT;  Surgeon: [name] Lance, MD;  Location: [place] CV LAB;  Service: Cardiovascular;  Laterality: N/A;  . CARDIAC CATHETERIZATION    . CARDIAC VALVE REPLACEMENT    . CARDIOVERSION N/A 11/13/2016   Procedure: CARDIOVERSION;  Surgeon: [name] Klein, MD;   Location: [facility] ENDOSCOPY;  Service: Cardiovascular;  Laterality: N/A;  . COLONOSCOPY  06/12/2007   [place]k- Normal rectum/Normal colon  . COLONOSCOPY  12/16/02   small hemorrhoids  . COLONOSCOPY  06/20/2012   Procedure: COLONOSCOPY;  Surgeon: Daneil Dolin, MD;  Location: [facility] ENDO SU[facility];  Service: Endoscopy;  Laterality: N/A;  10:45  . defibrillator implanted 2006    . DOPPLER ECHOCARDIOGRAPHY  2012  . DOPPLER ECHOCARDIOGRAPHY  05,06,07,08,09,2011  . ESOPHAGOGASTRODUODENOSCOPY  06/12/2007   Rourk- normal esophagus, small hiatal hernia, otherwise normal stomach, D1, D2  . EYE SURGERY Right 2005  . EYE SURGERY Left 2006  . ICD LEAD REMOVAL Left 02/08/2015   Procedure: ICD LEAD REMOVAL;  Surgeon: [name] Lance, MD;  Location: [place];  Service: Cardiovascular;  Laterality: Left;  "Will plan extraction and  insertion of a BiV PM"  **Dr. Roxan Hockey backing up case**  . IMPLANTABLE CARDIOVERTER DEFIBRILLATOR (ICD) GENERATOR CHANGE Left 02/08/2015   Procedure: ICD GENERATOR CHANGE;  Surgeon: [name] Lance, MD;  Location: [place];  Service: Cardiovascular;  Laterality: Left;  . INSERT / REPLACE / REMOVE PACEMAKER    . PACEMAKER INSERTION  May 2016  . pacemaker placed  2004  . right breast cyst removed     benign   . right cataract removed     2005  . TEE WITHOUT CARDIOVERSION N/A 11/13/2016   Procedure: TRANSESOPHAGEAL ECHOCARDIOGRAM (TEE);  Surgeon: [name] Klein, MD;  Location: [facility] ENDOSCOPY;  Service: Cardiovascular;  Laterality: N/A;  . THYROIDECTOMY       reports that she quit smoking about 33 years ago. Her smoking use included cigarettes. She has a 30.00 pack-year smoking history. She has never used smokeless tobacco. She reports that she does not drink alcohol and does not use drugs.  Allergies  Allergen Reactions  . Penicillins Other (See Comments)    Bruise    . Aspirin Other (See Comments)    On coumadin  . Fentanyl Dermatitis    Rash with patch   . Niacin Itching    Family  History  Problem Relation Age of Onset  . Pancreatic cancer Mother   . Heart disease Father   . Heart disease Sister   . Heart attack Sister   . Heart disease Brother   . Diabetes Brother   . Heart disease Brother   . Diabetes Brother   . Hypertension Brother   . Lung cancer Brother     Prior to Admission medications   Medication Sig Start Date End Date Taking? Authorizing Provider  acetaminophen (TYLENOL) 500 MG tablet Take 1 tablet (500 mg total) by mouth every 6 (six) hours as needed (pain). Patient taking differently: Take 500 mg by mouth every 6 (six) hours as needed for moderate pain (pain). 07/08/20  Yes Oretha Milch D, MD  albuterol (PROVENTIL) (2.5 MG/3ML) 0.083% nebulizer solution Take 3 mLs (2.5 mg total) by nebulization in the morning and at bedtime. *May use additional one time if needed for shortness of breath Patient taking differently: Take 2.5 mg by nebulization in the morning and at bedtime. 01/10/21  Yes [name] Helper, MD  albuterol (VENTOLIN HFA) 108 (90 Base) MCG/ACT inhaler Inhale 1 puff into the lungs every 6 (six) hours as needed for wheezing or shortness of breath. 01/10/21  Yes [name] Helper, MD  allopurinol (ZYLOPRIM) 100 MG tablet TAKE ONE TABLET BY MOUTH ONCE DAILY. Patient taking differently: Take 100 mg by mouth daily. 01/04/21  Yes [name] Helper, MD  BREO ELLIPTA 100-25 MCG/INH AEPB INHALE (1) PUFF BY MOUTH INTO THE LUNGS DAILY Patient taking differently: Inhale 1 puff into the lungs daily. 01/04/21  Yes [name] Helper, MD  cholecalciferol (VITAMIN D3) 25 MCG (1000 UNIT) tablet Take 1 tablet (1,000 Units total) by mouth in the morning. 07/08/20  Yes Oretha Milch D, MD  fluticasone (FLONASE) 50 MCG/ACT nasal spray Place 2 sprays into both nostrils daily. Patient taking differently: Place 2 sprays into both nostrils daily as needed for allergies. 07/08/20  Yes Desiree Hane, MD  folic acid (FOLVITE) 1 MG tablet TAKE ONE TABLET BY  MOUTH ONCE DAILY. Patient taking differently: Take 1 mg by mouth daily. 01/04/21  Yes [name] Helper, MD  furosemide (LASIX) 40 MG tablet Take 40 mg daily, except take 40 mg TWICE A DAY  on Saturday and Sunday Patient taking differently: Take 80 mg by mouth 2 (two) times daily. 01/11/21  Yes [name] Lance, MD  gabapentin (NEURONTIN) 300 MG capsule TAKE (1) CAPSULE BY MOUTH TWICE DAILY Patient taking differently: Take 300 mg by mouth 2 (two) times daily. 01/04/21  Yes [name] Helper, MD  levETIRAcetam (KEPPRA) 500 MG tablet TAKE (1) TABLET BY MOUTH TWICE DAILY AT 9AM AND 9PM Patient taking differently: Take 500 mg by mouth 2 (two) times daily. 01/04/21  Yes [name] Helper, MD  [facility] PAIN RELIEF PATCH EX) Apply 1 patch topically daily as needed (pain).   Yes [provider]  metoprolol tartrate (LOPRESSOR) 50 MG tablet TAKE (1) TABLET BY MOUTH TWICE DAILY AT 9AM AND 9PM. Patient taking differently: Take 50 mg by mouth 2 (two) times daily. 01/04/21  Yes [name] Helper, MD  nitroGLYCERIN (NITROSTAT) 0.4 MG SL tablet DISSOLVE 1 TABLET SUBLINGUALLY AS NEEDED FOR CHEST PAIN, MAY REPEAT EVERY 5 MINUTES. AFTER 3 CALL 911. Patient taking differently: Place 0.4 mg under the tongue every 5 (five) minutes as needed for chest pain. 09/08/20  Yes [name] Helper, MD  ondansetron (ZOFRAN-ODT) 4 MG disintegrating tablet TAKE (1) TABLET BY MOUTH EVERY EIGHT HOURS AS NEEDED FOR NAUSEA OR VOMITING Patient taking differently: Take 4 mg by mouth every 8 (eight) hours as needed for nausea or vomiting. 01/14/21  Yes [name] Helper, MD  OXYGEN Inhale 3 L into the lungs continuous. 07/08/20  Yes [provider]  polyethylene glycol (MIRALAX / GLYCOLAX) 17 g packet Take 17 g by mouth daily as needed. Patient taking differently: Take 17 g by mouth daily as needed for mild constipation. 07/08/20  Yes Oretha Milch D, MD  potassium chloride SA (KLOR-CON) 20 MEQ tablet  TAKE ONE TABLET BY MOUTH ONCE DAILY. Patient taking differently: Take 20 mEq by mouth daily. 01/04/21  Yes Noreene Larsson, NP  pravastatin (PRAVACHOL) 40 MG tablet TAKE ONE TABLET BY MOUTH ONCE DAILY. Patient taking differently: Take 40 mg by mouth daily. 01/04/21  Yes [name] Helper, MD  SYNTHROID 88 MCG tablet TAKE 1 TABLET BEFORE BREAKFAST. Patient taking differently: Take 88 mcg by mouth daily before breakfast. 01/04/21  Yes [name] Helper, MD  warfarin (COUMADIN) 10 MG tablet TAKE (1) TABLET BY MOUTH DAILY AT 4PM Patient taking differently: Take 10 mg by mouth daily at 6 (six) AM. 12/29/20  Yes [name] Helper, MD  benzonatate (TESSALON) 100 MG capsule Take 1 capsule (100 mg total) by mouth 2 (two) times daily as needed for cough. Patient not taking: No sig reported 09/22/20   Noreene Larsson, NP  HYDROcodone-homatropine [facility]) 5-1.5 MG/5ML syrup Take 5 mLs by mouth every 8 (eight) hours as needed for cough. Patient not taking: No sig reported 09/28/20   Jacquelin Hawking, NP  NON FORMULARY Diet Change: Soft diet with dys 2 (ground) meats, thin liquids (NAS, cons CHO, heart healthy) 07/26/20   [provider]  senna (SENOKOT) 8.6 MG TABS tablet Take 2 tablets (17.2 mg total) by mouth daily as needed for mild constipation. Patient not taking: No sig reported 07/08/20   Desiree Hane, MD  UNABLE TO FIND Compression Stockings R-  Ankle  26.5cm   Calf 38cm  Leg  44cm L-  Ankle  27.5 cm   Calf 39cm  Leg  44cm Patient taking differently: Compression Stockings R-  Ankle  26.5cm   Calf 38cm  Leg  44cm L-  Ankle  27.5 cm   Calf 39cm  Leg  44cm 09/15/20   [name] Helper, MD  UNABLE TO FIND 15-19 mmhg compression hose, knee high Patient not taking: Reported on 01/18/2021 11/04/20   [name] Helper, MD    Physical Exam: Constitutional: Moderately built and nourished. Vitals:   01/18/21 1800 01/18/21 1830 01/18/21 1924 01/18/21 1940  BP: (!) 118/56 (!) 100/52 129/81  118/64  Pulse: 60 (!) 59 60 60  Resp: 18 17 18 17   Temp:      TempSrc:      SpO2: 100% 100%  100%  Weight:      Height:       Eyes: Anicteric no pallor. ENMT: No discharge from the ears eyes nose and mouth. Neck: No mass felt.  No neck rigidity. Respiratory: No rhonchi or crepitations. Cardiovascular: S1-S2 heard. Abdomen: Soft nontender bowel sounds present. Musculoskeletal: No edema. Skin: No rash. Neurologic: Patient is lethargic and responds to her name.  Pupils are reacting to light.  Moving all extremities. Psychiatric: Lethargic.   Labs on Admission: I have personally reviewed following labs and imaging studies  CBC: Recent Labs  Lab 01/18/21 1114 01/18/21 1656 01/18/21 1945  WBC 5.1 5.4  --   NEUTROABS 3.7 4.3  --   HGB 10.5* 10.7* 9.2*  HCT 35.9* 36.4 27.0*  MCV 93.0 92.6  --   PLT 166 193  --    Basic Metabolic Panel: Recent Labs  Lab 01/18/21 1142 01/18/21 1930 01/18/21 1945  NA 139 143 142  K 4.6 4.5 4.7  CL 99 107 101  CO2 29 28  --   GLUCOSE 104* 112* 115*  BUN 87* 83* 96*  CREATININE 2.84* 2.44* 2.80*  CALCIUM 9.5 7.4*  --   PHOS 4.6  --   --    GFR: Estimated Creatinine Clearance: 16.7 mL/min (A) (by C-G formula based on SCr of 2.8 mg/dL (H)). Liver Function Tests: Recent Labs  Lab 01/18/21 1142 01/18/21 1930  AST  --  32  ALT  --  20  ALKPHOS  --  38  BILITOT  --  0.4  PROT  --  5.7*  ALBUMIN 3.9 2.8*   No results for input(s): LIPASE, AMYLASE in the last 168 hours. No results for input(s): AMMONIA in the last 168 hours. Coagulation Profile: Recent Labs  Lab 01/13/21 1131 01/18/21 1656  INR 3.0 1.6*   Cardiac Enzymes: No results for input(s): CKTOTAL, CKMB, CKMBINDEX, TROPONINI in the last 168 hours. BNP (last 3 results) No results for input(s): PROBNP in the last 8760 hours. HbA1C: No results for input(s): HGBA1C in the last 72 hours. CBG: Recent Labs  Lab 01/18/21 2008  GLUCAP 116*   Lipid Profile: No results  for input(s): CHOL, HDL, LDLCALC, TRIG, CHOLHDL, LDLDIRECT in the last 72 hours. Thyroid Function Tests: No results for input(s): TSH, T4TOTAL, FREET4, T3FREE, THYROIDAB in the last 72 hours. Anemia Panel: No results for input(s): VITAMINB12, FOLATE, FERRITIN, TIBC, IRON, RETICCTPCT in the last 72 hours. Urine analysis:    Component Value Date/Time   COLORURINE YELLOW 01/18/2021 1901   APPEARANCEUR CLEAR 01/18/2021 1901   LABSPEC 1.011 01/18/2021 1901   PHURINE 5.0 01/18/2021 1901   GLUCOSEU NEGATIVE 01/18/2021 1901   GLUCOSEU NEG mg/dL 03/22/2010 2313   HGBUR NEGATIVE 01/18/2021 1901   HGBUR negative 05/27/2010 1122   BILIRUBINUR NEGATIVE 01/18/2021 1901   BILIRUBINUR negative 04/28/2019 1500   BILIRUBINUR neg 03/11/2013 1443   [place] 01/18/2021 1901  PROTEINUR NEGATIVE 01/18/2021 1901   UROBILINOGEN 0.2 04/28/2019 1500   UROBILINOGEN 0.2 03/27/2012 1201   NITRITE NEGATIVE 01/18/2021 1901   LEUKOCYTESUR NEGATIVE 01/18/2021 1901   Sepsis Labs: @LABRCNTIP (procalcitonin:4,lacticidven:4) ) Recent Results (from the past 240 hour(s))  Resp Panel by RT-PCR (Flu A&B, Covid) Nasopharyngeal Swab     Status: None   Collection Time: 01/18/21  5:08 PM   Specimen: Nasopharyngeal Swab; Nasopharyngeal(NP) swabs in vial transport medium  Result Value Ref Range Status   SARS Coronavirus 2 by RT PCR NEGATIVE NEGATIVE Final    Comment: (NOTE) SARS-CoV-2 target nucleic acids are NOT DETECTED.  The SARS-CoV-2 RNA is generally detectable in upper respiratory specimens during the acute phase of infection. The lowest concentration of SARS-CoV-2 viral copies this assay can detect is 138 copies/mL. A negative result does not preclude SARS-Cov-2 infection and should not be used as the sole basis for treatment or other patient management decisions. A negative result may occur with  improper specimen collection/handling, submission of specimen other than nasopharyngeal swab, presence of  viral mutation(s) within the areas targeted by this assay, and inadequate number of viral copies(<138 copies/mL). A negative result must be combined with clinical observations, patient history, and epidemiological information. The expected result is Negative.  Fact Sheet for Patients:  EntrepreneurPulse.com.au  Fact Sheet for Healthcare Providers:  IncredibleEmployment.be  This test is no t yet approved or cleared by the [place] FDA and  has been authorized for detection and/or diagnosis of SARS-CoV-2 by FDA under an Emergency Use Authorization (EUA). This EUA will remain  in effect (meaning this test can be used) for the duration of the COVID-19 declaration under Section 564(b)(1) of the Act, 21 U.S.C.section 360bbb-3(b)(1), unless the authorization is terminated  or revoked sooner.       Influenza A by PCR NEGATIVE NEGATIVE Final   Influenza B by PCR NEGATIVE NEGATIVE Final    Comment: (NOTE) The Xpert Xpress SARS-CoV-2/FLU/RSV plus assay is intended as an aid in the diagnosis of influenza from Nasopharyngeal swab specimens and should not be used as a sole basis for treatment. Nasal washings and aspirates are unacceptable for Xpert Xpress SARS-CoV-2/FLU/RSV testing.  Fact Sheet for Patients: EntrepreneurPulse.com.au  Fact Sheet for Healthcare Providers: IncredibleEmployment.be  This test is not yet approved or cleared by the [place] FDA and has been authorized for detection and/or diagnosis of SARS-CoV-2 by FDA under an Emergency Use Authorization (EUA). This EUA will remain in effect (meaning this test can be used) for the duration of the COVID-19 declaration under Section 564(b)(1) of the Act, 21 U.S.C. section 360bbb-3(b)(1), unless the authorization is terminated or revoked.  Performed at [facility], [place] [address], [place], [place] [address]      Radiological  Exams on Admission: US Renal  Result Date: 01/18/2021 CLINICAL DATA:  Renal failure. EXAM: RENAL / URINARY TRACT ULTRASOUND COMPLETE COMPARISON:  Abdominal CT 06/26/2016. FINDINGS: Right Kidney: Renal measurements: 9.9 x 4.1 x 4.5 cm = volume: 95 mL. Mild hydronephrosis. Renal parenchymal thinning with increased parenchymal echogenicity. 1.1 cm cyst in the mid kidney. Left Kidney: Very limited evaluation due to shadowing bowel gas. Renal measurements: 10.4 x 5.1 x 4.9 cm = volume: 136 mL. No gross hydronephrosis. More detailed assessment is limited. Cyst on prior CT is not seen. Bladder: Appears normal for degree of bladder distention. Neither ureteral jet is seen. Other: None. IMPRESSION: 1. Mild right hydronephrosis. 2. Right renal parenchymal thinning and increased echogenicity consistent with chronic medical renal  disease. 3. Significantly limited evaluation of the left kidney, no gross hydronephrosis. Electronically Signed   By: Keith Rake M.D.   On: 01/18/2021 18:26   DG Chest Port 1 View  Result Date: 01/18/2021 CLINICAL DATA:  87 year old female with shortness of breath. EXAM: PORTABLE CHEST 1 VIEW COMPARISON:  Chest radiograph dated 12/31/2020. FINDINGS: There is cardiomegaly with mild central vascular congestion. No focal consolidation, pleural effusion, or pneumothorax. Left pectoral AICD device. Atherosclerotic calcification of the aorta. Median sternotomy wires. No acute osseous pathology. IMPRESSION: Cardiomegaly with mild central vascular congestion. No focal consolidation. Electronically Signed   By: Anner Crete M.D.   On: 01/18/2021 18:32    EKG: Independently reviewed.  Paced rhythm.  Assessment/Plan Principal Problem:   Acute respiratory failure with hypoxia and hypercapnia (HCC) Active Problems:   Hx of aortic valve replacement, mechanical   S/P MVR (mitral valve replacement)   Chronic anticoagulation, on coumadin for Mechanical AVR and MVR   S/P ICD (internal  cardiac defibrillator) procedure   Persistent atrial fibrillation (HCC)   COPD with acute exacerbation (HCC)   Type 2 diabetes mellitus with hypoglycemia without coma (HCC)   Acute systolic CHF (congestive heart failure) (HCC)   Stage 3b chronic kidney disease (HCC)   Seizure (HCC)   Acute respiratory failure with hypoxia ([place])    1. Acute respiratory failure with hypoxia and hypercarbia likely secondary to a combination of COPD exacerbation and CHF decompensation.  At this point since patient is still lethargic I have consulted pulmonary critical care and since patient's niece states that patient wanted to be a full code. 2. Acute on chronic combined systolic and diastolic CHF last EF as per the cardiology notes in.  2022 was around 50%.  Lasix 40 mg IV daily currently ordered.  We will closely monitor intake output and metabolic panel. 3. COPD exacerbation for which patient is placed on nebulizer Pulmicort and steroids and antibiotics.  Appreciate pulmonary critical care input.  Presently on BiPAP.  Recommend repeating ABG in the morning. 4. Acute encephalopathy likely from respiratory decompensation.  Closely monitor. 5. History of mechanical aortic and mitral valve replacement on Coumadin.  Since patient n.p.o. we will keep patient on heparin if INR less than 2.5. 6. A. fib with history of complete heart block status post pacemaker placement presently on heparin since patient is n.p.o.  Takes Coumadin at home.  Check INR. 7. Chronic kidney disease stage IV creatinine appears to be at baseline. 8. Prosthetic valve associated hemolytic anemia hemoglobin is at baseline follow CBC. 9. Diabetes mellitus type 2 mention in the chart 10. Medication closely follow CBGs. 11. History of seizures on Keppra which we will dosages IV for now.  I do not think patient is having any active seizures.  No change in mental status likely from respiratory status.  Patient is responding to her name. 12. History of  hypothyroidism on IV Synthroid for now.  Change to oral dose when patient can take orally.  Since patient had acute respiratory failure with hypoxia with mental status changes will need close monitoring for worsening in inpatient status.   DVT prophylaxis: Heparin/Coumadin. Code Status: Full code as discussed with patient's niece and decision was sent from previous. Family Communication: Patient's niece. Disposition Plan: Home when stable. Consults called: Pulmonary critical care. Admission status: Inpatient.   Rise Patience MD [facility] Pager [phone].  If 7PM-7AM, please contact night-coverage [email or web address] Password [facility]  01/18/2021, 10:09 PM

## 2021-01-18 NOTE — Progress Notes (Signed)
Pt here for retacrit 40,000 units every 28 days.  Vital signs WNL for treatment. Last dose 12/21/2020.  Hemoglobin is 10.5.  [name] [name] presents today for injection per the provider's orders.  retacrit 40000 units. administration without incident; injection site WNL; see MAR for injection details.  Patient tolerated procedure well and without incident.  No questions or complaints noted at this time. Pt has been sleepy for the last 2 days.  Pt is responsive and alert and oriented. RN explained if pt does not improve to have her worked up in the ED.  Verbalized understanding. Pt stable during and after injection. AVS reviewed.  Discharged in stable condition via wheelchair.

## 2021-01-18 NOTE — Progress Notes (Signed)
Pt transported from ED to ICU 1238 on bipap 100% fio2.  Pt tolerated transport well without incident.

## 2021-01-18 NOTE — ED Notes (Signed)
Arterial PCO2 65.1 MD aware

## 2021-01-18 NOTE — ED Notes (Signed)
Pt in bed, not able to respond to questions properly, MD in room and called for respiratory for a bipap.

## 2021-01-18 NOTE — ED Triage Notes (Signed)
Pt from home shob, weight gain

## 2021-01-19 DIAGNOSIS — J441 Chronic obstructive pulmonary disease with (acute) exacerbation: Secondary | ICD-10-CM

## 2021-01-19 DIAGNOSIS — J9602 Acute respiratory failure with hypercapnia: Secondary | ICD-10-CM | POA: Diagnosis not present

## 2021-01-19 DIAGNOSIS — J9601 Acute respiratory failure with hypoxia: Secondary | ICD-10-CM | POA: Diagnosis not present

## 2021-01-19 LAB — GLUCOSE, CAPILLARY
Glucose-Capillary: 121 mg/dL — ABNORMAL HIGH (ref 70–99)
Glucose-Capillary: 296 mg/dL — ABNORMAL HIGH (ref 70–99)
Glucose-Capillary: 122 mg/dL — ABNORMAL HIGH (ref 70–99)

## 2021-01-19 LAB — COMPREHENSIVE METABOLIC PANEL
ALT: 25 U/L (ref 0–44)
AST: 41 U/L (ref 15–41)
Albumin: 3.7 g/dL (ref 3.5–5.0)
Alkaline Phosphatase: 46 U/L (ref 38–126)
Anion gap: 9 (ref 5–15)
BUN: 96 mg/dL — ABNORMAL HIGH (ref 8–23)
CO2: 34 mmol/L — ABNORMAL HIGH (ref 22–32)
Calcium: 9.1 mg/dL (ref 8.9–10.3)
Chloride: 102 mmol/L (ref 98–111)
Creatinine, Ser: 2.73 mg/dL — ABNORMAL HIGH (ref 0.44–1.00)
GFR, Estimated: 16 mL/min — ABNORMAL LOW (ref >60)
Glucose, Bld: 147 mg/dL — ABNORMAL HIGH (ref 70–99)
Potassium: 5.5 mmol/L — ABNORMAL HIGH (ref 3.5–5.1)
Sodium: 145 mmol/L (ref 135–145)
Total Bilirubin: 0.4 mg/dL (ref 0.3–1.2)
Total Protein: 7.7 g/dL (ref 6.5–8.1)

## 2021-01-19 LAB — CBC
HCT: 34.6 % — ABNORMAL LOW (ref 36.0–46.0)
HCT: 33.5 % — ABNORMAL LOW (ref 36.0–46.0)
Hemoglobin: 10.2 g/dL — ABNORMAL LOW (ref 12.0–15.0)
Hemoglobin: 9.9 g/dL — ABNORMAL LOW (ref 12.0–15.0)
MCH: 27.2 pg (ref 26.0–34.0)
MCH: 27.2 pg (ref 26.0–34.0)
MCHC: 29.5 g/dL — ABNORMAL LOW (ref 30.0–36.0)
MCHC: 29.6 g/dL — ABNORMAL LOW (ref 30.0–36.0)
MCV: 92.3 fL (ref 80.0–100.0)
MCV: 92 fL (ref 80.0–100.0)
Platelets: 148 10*3/uL — ABNORMAL LOW (ref 150–400)
Platelets: 169 10*3/uL (ref 150–400)
RBC: 3.75 MIL/uL — ABNORMAL LOW (ref 3.87–5.11)
RBC: 3.64 MIL/uL — ABNORMAL LOW (ref 3.87–5.11)
RDW: 18.7 % — ABNORMAL HIGH (ref 11.5–15.5)
RDW: 18.9 % — ABNORMAL HIGH (ref 11.5–15.5)
WBC: 4.3 10*3/uL (ref 4.0–10.5)
WBC: 4.1 10*3/uL (ref 4.0–10.5)
nRBC: 0 % (ref 0.0–0.2)
nRBC: 0.5 % — ABNORMAL HIGH (ref 0.0–0.2)

## 2021-01-19 LAB — HEPARIN LEVEL (UNFRACTIONATED)
Heparin Unfractionated: 0.8 IU/mL — ABNORMAL HIGH (ref 0.30–0.70)
Heparin Unfractionated: 0.65 IU/mL (ref 0.30–0.70)

## 2021-01-19 LAB — PROTIME-INR
INR: 1.8 — ABNORMAL HIGH (ref 0.8–1.2)
Prothrombin Time: 20.8 seconds — ABNORMAL HIGH (ref 11.4–15.2)

## 2021-01-19 LAB — AMMONIA: Ammonia: 19 umol/L (ref 9–35)

## 2021-01-19 MED ORDER — IPRATROPIUM-ALBUTEROL 0.5-2.5 (3) MG/3ML IN SOLN
3.0000 mL | Freq: Three times a day (TID) | RESPIRATORY_TRACT | Status: DC
  Administered 2021-01-20 – 2021-01-21 (×4): 3 mL via RESPIRATORY_TRACT
  Filled 2021-01-19 (×4): qty 3

## 2021-01-19 MED ORDER — WARFARIN SODIUM 5 MG PO TABS
12.5000 mg | ORAL_TABLET | Freq: Once | ORAL | Status: AC
  Administered 2021-01-19: 12.5 mg via ORAL
  Filled 2021-01-19: qty 2

## 2021-01-19 MED ORDER — HYDRALAZINE HCL 20 MG/ML IJ SOLN
10.0000 mg | Freq: Four times a day (QID) | INTRAMUSCULAR | Status: DC | PRN
  Administered 2021-01-19: 10 mg via INTRAVENOUS
  Filled 2021-01-19: qty 1

## 2021-01-19 MED ORDER — PREDNISONE 20 MG PO TABS
40.0000 mg | ORAL_TABLET | Freq: Every day | ORAL | Status: AC
  Administered 2021-01-20 – 2021-01-23 (×4): 40 mg via ORAL
  Filled 2021-01-19 (×4): qty 2

## 2021-01-19 MED ORDER — HEPARIN (PORCINE) 25000 UT/250ML-% IV SOLN
1100.0000 [IU]/h | INTRAVENOUS | Status: DC
  Administered 2021-01-19 (×2): 1200 [IU]/h via INTRAVENOUS
  Administered 2021-01-20: 1100 [IU]/h via INTRAVENOUS
  Filled 2021-01-19 (×3): qty 250

## 2021-01-19 MED ORDER — WARFARIN - PHARMACIST DOSING INPATIENT: Freq: Every day | Status: DC

## 2021-01-19 MED ORDER — SODIUM ZIRCONIUM CYCLOSILICATE 5 G PO PACK
5.0000 g | PACK | Freq: Once | ORAL | Status: AC
  Administered 2021-01-19: 5 g via ORAL
  Filled 2021-01-19: qty 1

## 2021-01-19 NOTE — TOC Initial Note (Signed)
Transition of Care [facility]) - Initial/Assessment Note    Patient Details  Name: [name] [name] MRN: [id number] Date of Birth: 10-23-1931  Transition of Care [facility]) CM/SW Contact:    Leeroy Cha, RN Phone Number: 01/19/2021, 7:32 AM  Clinical Narrative:                 87 y.o. female with history of COPD, chronic combined systolic and diastolic heart failure, complete heart block atrial fibrillation status post pacemaker and ICD placement and history of mechanical aortic valve replacement, on Coumadin chronic kidney disease stage III, hypothyroidism, seizures presents to the ER because of increasing shortness of breath.  Patient did receive a dose of Retacrit prior to coming to the hospital.  As per the patient's niece patient has been short of breath for last 2 days.  Did not complain of any chest pain.  Her Lasix dose was recently decreased.  ED Course: In the ER patient was found to be short of breath and lethargic and initial ABG showing pH of 7.22 and PCO2 of 80.  Chest x-ray was just not showing anything acute.  EKG was showing paced rhythm.  COVID test was negative.  Patient was placed on BiPAP.  Repeat ABG showed improvement in the carbon dioxide with pH improving to 7.33 and PCO2 of 64.  On my exam patient was lethargic and on BiPAP.  Patient was given Lasix and nebulizer treatment.  Labs are largely at baseline with creatinine of 2.4 hemoglobin around 9. [id number]-on bipap at 40%, iv solu medrol, iv zithromax, iv heparin and iv keppra. Plan is to return to home with self care, does have a niece in the area. Expected Discharge Plan: Home/Self Care Barriers to Discharge: Continued Medical Work up   Patient Goals and CMS Choice Patient states their goals for this hospitalization and ongoing recovery are:: i would like to go back home and be well      Expected Discharge Plan and Services Expected Discharge Plan: Home/Self Care   Discharge Planning Services: CM Consult   Living  arrangements for the past 2 months: Single Family Home                                      Prior Living Arrangements/Services Living arrangements for the past 2 months: Single Family Home Lives with:: Self Patient language and need for interpreter reviewed:: Yes Do you feel safe going back to the place where you live?: Yes      Need for Family Participation in Patient Care: No (Comment) Care giver support system in place?: No (comment)   Criminal Activity/Legal Involvement Pertinent to Current Situation/Hospitalization: No - Comment as needed  Activities of Daily Living Home Assistive Devices/Equipment: Other (Comment) (pt unable to verbalize) ADL Screening (condition at time of admission) Patient's cognitive ability adequate to safely complete daily activities?: No Is the patient deaf or have difficulty hearing?:  (unknown) Does the patient have difficulty seeing, even when wearing glasses/contacts?:  (unknown) Does the patient have difficulty concentrating, remembering, or making decisions?: Yes (new today) Patient able to express need for assistance with ADLs?: No Does the patient have difficulty dressing or bathing?: Yes Independently performs ADLs?: No Communication: Needs assistance Is this a change from baseline?: Change from baseline, expected to last >3 days Dressing (OT): Needs assistance Is this a change from baseline?: Change from baseline, expected to last >3 days Grooming: Needs assistance  Is this a change from baseline?: Change from baseline, expected to last >3 days Feeding: Needs assistance Is this a change from baseline?: Change from baseline, expected to last >3 days Bathing: Needs assistance Is this a change from baseline?: Change from baseline, expected to last >3 days Toileting: Needs assistance Is this a change from baseline?: Change from baseline, expected to last >3days In/Out Bed: Needs assistance Walks in Home: Needs assistance Is this a  change from baseline?: Change from baseline, expected to last >3 days Does the patient have difficulty walking or climbing stairs?: Yes Weakness of Legs: Both Weakness of Arms/Hands: Both  Permission Sought/Granted                  Emotional Assessment Appearance:: Appears stated age Attitude/Demeanor/Rapport: Engaged Affect (typically observed): Calm Orientation: : Oriented to Self,Oriented to Place,Oriented to  Time,Oriented to Situation Alcohol / Substance Use: Not Applicable Psych Involvement: No (comment)  Admission diagnosis:  Hypoxia [R09.02] Acute on chronic systolic congestive heart failure (HCC) [I50.23] Acute respiratory failure with hypoxia ([place]) [J96.01] AKI (acute kidney injury) ([place]) [N17.9] Patient Active Problem List   Diagnosis Date Noted  . Acute respiratory failure with hypoxia ([place]) 01/18/2021  . Acute respiratory failure with hypoxia and hypercapnia ([place]) 01/18/2021  . Gait abnormality 08/24/2020  . Chronic constipation 08/09/2020  . Acute UTI 08/03/2020  . Hypertensive heart and kidney disease with chronic systolic congestive heart failure and stage 4 chronic kidney disease ([place]) 07/15/2020  . Hyperlipidemia associated with type 2 diabetes mellitus ([place]) 07/15/2020  . Seizure ([place]) 07/07/2020  . Goals of care, counseling/discussion   . Palliative care by specialist   . Idiopathic hypotension   . CHF exacerbation ([place]) 06/29/2020  . Arthritis of right knee 06/20/2020  . Leg swelling 06/16/2020  . Generalized osteoarthritis 05/09/2020  . Primary osteoarthritis, left shoulder 04/21/2020  . Acute systolic CHF (congestive heart failure) ([place]) 03/31/2020  . Bilateral lower extremity edema   . Acute on chronic congestive heart failure ([place]) 03/30/2020  . Right-sided epistaxis 03/28/2020  . Acute pain of left shoulder 01/15/2020  . AAA (abdominal aortic aneurysm) without rupture ([place]) 01/14/2020  . COVID-19 virus detected 11/23/2019  . Stage 3b chronic  kidney disease ([place]) 08/04/2019  . Dyspnea 05/30/2019  . Anemia 05/30/2019  . Gout   . Glaucoma   . GERD (gastroesophageal reflux disease)   . Physical debility 04/28/2019  . Encounter for examination following treatment at hospital 11/03/2018  . Hyponatremia 09/27/2018  . H/O mitral valve replacement with mechanical valve 08/02/2018  . Chronic right hip pain 04/16/2018  . Chronic systolic congestive heart failure ([place]) 11/15/2017  . COPD with acute exacerbation ([place]) 11/15/2017  . Trochanteric bursitis, right hip 03/07/2017  . Persistent atrial fibrillation ([place])   . Nocturnal hypoxia 10/08/2016  . Dependence on nocturnal oxygen therapy 10/08/2016  . Hematuria 03/20/2016  . S/P ICD (internal cardiac defibrillator) procedure 02/08/2015  . Presence of cardiac pacemaker 02/08/2015  . CHB (complete heart block) ([place]) 10/07/2014  . Fatigue 07/20/2014  . Absolute anemia 06/24/2014  . Osteopenia 02/10/2014  . Encounter for therapeutic drug monitoring 10/29/2013  . OA (osteoarthritis) of knee 02/19/2013  . Chronic pain of right knee 01/02/2013  . Hemolytic anemia ([place]) 09/24/2012  . High risk medication use 05/24/2012  . COPD (chronic obstructive pulmonary disease) ([place]) 05/19/2012  . Chronic anticoagulation, on coumadin for Mechanical AVR and MVR 04/01/2012  . Cardiorenal syndrome with renal failure 03/22/2012  . Hx of aortic valve replacement,  mechanical 03/22/2012  . S/P MVR (mitral valve replacement) 03/22/2012  . Biventricular automatic implantable cardioverter defibrillator in situ 03/22/2012  . CKD (chronic kidney disease), stage IV ([place]) 03/22/2012  . Warfarin-induced coagulopathy ([place]) 03/21/2012  . Iron deficiency 10/04/2011  . Allergic rhinitis 02/14/2011  . Mitral valve disease 07/15/2010  . Sinus node dysfunction ([place]) 07/15/2010  . Type 2 diabetes mellitus with hypoglycemia without coma ([place]) 07/15/2010  . VARICOSE VEINS LOWER EXTREMITIES W/OTH COMPS 02/09/2009  .  Prediabetes 06/15/2008  . Hypothyroidism, postsurgical 02/24/2008  . Hyperlipidemia LDL goal <70 02/24/2008  . Essential hypertension 02/24/2008  . Non-ischemic cardiomyopathy with ICD 02/24/2008  . Spondylosis 02/24/2008   PCP:  Fayrene Helper, MD Pharmacy:   [facility], [place] [address] Phone: [phone] Fax: [phone]     Social Determinants of Health (SDOH) Interventions    Readmission Risk Interventions Readmission Risk Prevention Plan 07/08/2020 04/01/2020 04/01/2020  Transportation Screening Complete Complete Complete  PCP or Specialist Appt within 3-5 Days - - -  [place] or [place] for [place] - - -  Medication Review Press photographer) Complete Complete -  PCP or Specialist appointment within 3-5 days of discharge Complete - -  HRI or Home Care Consult Complete Complete Complete  SW Recovery Care/Counseling Consult Complete Complete -  Palliative Care Screening Not Applicable Not Applicable Not Applicable  Skilled Nursing Facility Complete Not Applicable Not Applicable  Some recent data might be hidden

## 2021-01-19 NOTE — Progress Notes (Signed)
ANTICOAGULATION CONSULT NOTE - Follow Up Consult  Pharmacy Consult for Heparin, Warfarin Indication: aortic and mitral mechanical valve replacement  Allergies  Allergen Reactions  . Penicillins Other (See Comments)    Bruise    . Aspirin Other (See Comments)    On coumadin  . Fentanyl Dermatitis    Rash with patch   . Niacin Itching    Patient Measurements: Height: 5\' 7"  (170.2 cm) Weight: 87.3 kg (192 lb 7.4 oz) IBW/kg (Calculated) : 61.6 Heparin Dosing Weight: 81 kg  Vital Signs: Temp: 97.7 F (36.5 C) (04/27 1600) Temp Source: Oral (04/27 1600) BP: 126/55 (04/27 1700) Pulse Rate: 60 (04/27 1700)  Labs: Recent Labs    01/18/21 1114 01/18/21 1142 01/18/21 1656 01/18/21 1930 01/18/21 1945 01/19/21 0718 01/19/21 1715  HGB 10.5*  --  10.7*  --  9.2* 10.2*  --   HCT 35.9*  --  36.4  --  27.0* 34.6*  --   PLT 166  --  193  --   --  148*  --   LABPROT  --   --  19.0*  --   --  20.8*  --   INR  --   --  1.6*  --   --  1.8*  --   HEPARINUNFRC  --   --   --   --   --  0.80* 0.65  CREATININE  --    < >  --  2.44* 2.80* 2.73*  --   TROPONINIHS  --   --  28* 37*  --   --   --    < > = values in this interval not displayed.    Estimated Creatinine Clearance: 16.8 mL/min (A) (by C-G formula based on SCr of 2.73 mg/dL (H)).   Medications:  Scheduled:  . budesonide (PULMICORT) nebulizer solution  0.5 mg Nebulization BID  . chlorhexidine  15 mL Mouth Rinse BID  . Chlorhexidine Gluconate Cloth  6 each Topical Daily  . furosemide  40 mg Intravenous Q12H  . ipratropium-albuterol  3 mL Nebulization Q4H  . levothyroxine  44 mcg Intravenous Daily  . mouth rinse  15 mL Mouth Rinse q12n4p  . metoprolol tartrate  5 mg Intravenous Q6H  . [START ON 01/20/2021] predniSONE  40 mg Oral Q breakfast  . Warfarin - Pharmacist Dosing Inpatient   Does not apply q1600   Infusions:  . azithromycin Stopped (01/18/21 2335)  . heparin 1,200 Units/hr (01/19/21 1700)  . levETIRAcetam  Stopped (01/19/21 1159)    Assessment: 36 yoF admitted on 4/26 with acute respiratory failure.  PMH significant for CHF, COPD, complete heart block s/p defib pacemaker, CKD-IV, mechanical aortic valve replacement, mitral valve replacement on chronic warfarin anticoagulation.  INR on admission is 1.6.  Pharmacy consulted to begin IV heparin when INR < 2.5 due to NPO status.  Home warfarin 10mg  daily.  LD on 4/25.  01/19/2021 PM: Heparin level 0.65 - therapeutic on heparin 1200 units/hr at upper end of goal range  RN reports bleeding from IV site- checking Hg now per Dr Tyrell Antonio order  Goal of Therapy:   INR 2.5 - 3.5 Heparin level 0.3-0.7 units/ml Monitor platelets by anticoagulation protocol: Yes   Plan:  Decrease to heparin IV infusion to 1100 units/hr to maintain goal range at lower end due to bleeding  Recheck heparin level in 8h F/U Hg Continue heparin bridge while INR remains < 2.5 Daily INR, heparin level, and CBC -    Netta Cedars  PharmD, BCPS Clinical Pharmacist [facility] main pharmacy [phone] 01/19/2021,6:05 PM

## 2021-01-19 NOTE — Progress Notes (Signed)
Previous IV site on right AC began to bleed, site re wrapped and pt cleaned up. H/H ordered by MD

## 2021-01-19 NOTE — Progress Notes (Signed)
ANTICOAGULATION CONSULT NOTE - Follow Up Consult  Pharmacy Consult for Heparin, Warfarin Indication: aortic and mitral mechanical valve replacement  Allergies  Allergen Reactions  . Penicillins Other (See Comments)    Bruise    . Aspirin Other (See Comments)    On coumadin  . Fentanyl Dermatitis    Rash with patch   . Niacin Itching    Patient Measurements: Height: 5\' 7"  (170.2 cm) Weight: 87.3 kg (192 lb 7.4 oz) IBW/kg (Calculated) : 61.6 Heparin Dosing Weight: 81 kg  Vital Signs: Temp: 96.4 F (35.8 C) (04/27 0303) Temp Source: Axillary (04/27 0303) BP: 166/80 (04/27 0600) Pulse Rate: 62 (04/27 0600)  Labs: Recent Labs    01/18/21 1114 01/18/21 1142 01/18/21 1656 01/18/21 1930 01/18/21 1945 01/19/21 0718  HGB 10.5*  --  10.7*  --  9.2* 10.2*  HCT 35.9*  --  36.4  --  27.0* 34.6*  PLT 166  --  193  --   --  148*  LABPROT  --   --  19.0*  --   --   --   INR  --   --  1.6*  --   --   --   HEPARINUNFRC  --   --   --   --   --  0.80*  CREATININE  --    < >  --  2.44* 2.80* 2.73*  TROPONINIHS  --   --  28* 37*  --   --    < > = values in this interval not displayed.    Estimated Creatinine Clearance: 16.8 mL/min (A) (by C-G formula based on SCr of 2.73 mg/dL (H)).   Medications:  Scheduled:  . budesonide (PULMICORT) nebulizer solution  0.5 mg Nebulization BID  . chlorhexidine  15 mL Mouth Rinse BID  . Chlorhexidine Gluconate Cloth  6 each Topical Daily  . furosemide  40 mg Intravenous Q12H  . ipratropium-albuterol  3 mL Nebulization Q4H  . levothyroxine  44 mcg Intravenous Daily  . mouth rinse  15 mL Mouth Rinse q12n4p  . methylPREDNISolone (SOLU-MEDROL) injection  80 mg Intravenous Q12H  . metoprolol tartrate  5 mg Intravenous Q6H   Infusions:  . azithromycin Stopped (01/18/21 2335)  . heparin 1,300 Units/hr (01/19/21 0027)  . levETIRAcetam Stopped (01/19/21 0010)    Assessment: 78 yoF admitted on 4/26 with acute respiratory failure.  PMH  significant for CHF, COPD, complete heart block s/p defib pacemaker, CKD-IV, mechanical aortic valve replacement, mitral valve replacement on chronic warfarin anticoagulation.  INR on admission is 1.6.  Pharmacy consulted to begin IV heparin when INR < 2.5 due to NPO status.  Home warfarin 10mg  daily.  LD on 4/25.  Today, 01/19/2021: INR 1.8, subtherapeutic Heparin level 0.8 on heparin 1300 units/hr.  Elevated, but level obtained early at 6.5 hours, and may reflect part of bolus dose. CBC:  Hgb 10.2, Plt 148 SCr 2.73 No bleeding or complications reported.  Diet: initially NPO, advanced to cardiac diet No major warfarin drug-drug interactions noted   Goal of Therapy:   INR 2.5 - 3.5 Heparin level 0.3-0.7 units/ml Monitor platelets by anticoagulation protocol: Yes   Plan:  Resume Warfarin dosing, 12.5 mg PO today Decrease to heparin IV infusion at 1200 units/hr Continue heparin bridge while INR remains < 2.5 Heparin level 8 hours after rate change Daily INR, heparin level, and CBC   Gretta Arab PharmD, BCPS Clinical Pharmacist [facility] main pharmacy [phone] 01/19/2021,8:10 AM

## 2021-01-19 NOTE — Progress Notes (Signed)
   NAME:  [name] [name], MRN:  [id number], DOB:  12/20/1931, LOS: 1 ADMISSION DATE:  01/18/2021, CONSULTATION DATE:  4/26 REFERRING MD:  Hal Hope , CHIEF COMPLAINT:  Hypercarbia   History of Present Illness:  87 year old female w/ hx as per below. Presented 4/26 w/ cc: SOB and drowsiness. Admitted w/ working dx of acute [facility] respiratory failure in setting of acute HF +/-COPD exacerbation. Started on lasix IV, systemic steroids, BDs and abx. PCCM consulted for HCRF. Placed on BIPAP  Pertinent  Medical History  HFrEF (35% EF), also h/o diastolic dysfxn, atrial fib, COPD, prior CHB s/p ppm (biventricular) and defib, seasonal allergies, CKD stage IV, DM type 2, Dysphagia, esophageal reflux, aortic and mitral valve replacement (on AC), cataracts, tobacco abuse, IDA, obesity, NICM, prior thyroid cancer, UTis  Significant Hospital Events: Including procedures, antibiotic start and stop dates in addition to other pertinent events   . 4/26 admitted. Lasix/BD/systemic steroids/abx & bipap for HCRF. 7.31/65/110/32 . 4/27 feeling much better. Fully awake off BIPAP. -300 ml fluid balance   Interim History / Subjective:  Awake and in no distress.   Objective   Blood pressure (Abnormal) 166/80, pulse 62, temperature (Abnormal) 96.4 F (35.8 C), temperature source Axillary, resp. rate (Abnormal) 22, height 5\' 7"  (1.702 m), weight 87.3 kg, SpO2 100 %.    Vent Mode: BIPAP;Spontaneous;PCV FiO2 (%):  [30 %-50 %] 40 % Set Rate:  [12 bmp-14 bmp] 14 bmp PEEP:  [6 cmH20] 6 cmH20   Intake/Output Summary (Last 24 hours) at 01/19/2021 0721 Last data filed at 01/19/2021 0600 Gross per 24 hour  Intake 460.16 ml  Output 875 ml  Net -414.84 ml   Filed Weights   01/18/21 1612 01/19/21 0500  Weight: 90.7 kg 87.3 kg    Examination: General: pleasant 87 year old female. She is awake and oriented and in no distress.  HENT: NCAT no JVD. MMM Lungs: crackles both bases. No accessory use. sats 99% 2  LPM Cardiovascular: systolic murmur (holosystolic w/ + vavle click) Abdomen: soft not tender  Extremities: warm and dry. + LE edema  Neuro: awake and oriented  GU: voids   Labs/imaging that I havepersonally reviewed  (right click and "Reselect all SmartList Selections" daily)  See below   Resolved Hospital Problem list     Assessment & Plan:   Acute hypercarbic respiratory failure 2/2 acute HF w/ pulmonary edema +/- AECOPD Chronic systolic and diastolic HF H/o afib H/o CHB H/o aortic valve replacement ckd stage III Hypothyroidism  NICM DM type II Esophageal reflux Dysphagia  IDA  pulm problem list  Acute hypercarbic respiratory failure 2/2 acute HF w/ pulmonary edema +/- AECOPD (seems like copd maybe less a contributing factor than initially though) -much better Plan Cont to wean oxygen for sats > 90% Cont pulse ox Scheduled BDs & ICS Change BIPAP to PRN Would cont IV lasix as BP/BUn/cr allow Can change solumedrol to pred 40, will give for 4 more days and stop Mobilize pcxr am Day 2 azith. Reasonable to complete 5d     Best practice (right click and "Reselect all SmartList Selections" daily)  Per primary    pccm will sing off  Erick Colace ACNP-BC [place] Pager # [phone] OR # [phone] if no answer

## 2021-01-19 NOTE — Progress Notes (Signed)
Pt seen, awake, alert, speaking in full sentences, no respiratory distress or increased wob noted.  HR60, rr17, spo2 97% on 2lnc.  Bipap in room on standby but not indicated at this time.  RT will assist as needed.

## 2021-01-19 NOTE — Progress Notes (Signed)
PROGRESS NOTE    [name] [name]  GQQ:[id number] DOB: 09-02-1932 DOA: 01/18/2021 PCP: Fayrene Helper, MD   Brief Narrative: 87 year old with past medical history significant for COPD, chronic combined systolic and diastolic heart failure, complete heart block, A. fib status post pacemaker and ICD history of mechanical aortic valve on Coumadin, chronic kidney disease a stage III, hypothyroidism, seizure who presents with shortness of breath.  Patient received a dose of Retacrit prior to coming to the hospital.  Patient has been having shortness of breath for the last 2 days prior to admission.  Evaluation in the ED patient was found to be lethargic initial ABG showed pH of 7.2 a PCO2 of 80, chest x-ray which pulmonary congestion COVID test negative, patient was placed on BiPAP.  She was a started on IV Lasix nebulizer.  CCM was consulted.    Assessment & Plan:   Principal Problem:   Acute respiratory failure with hypoxia and hypercapnia (HCC) Active Problems:   Hx of aortic valve replacement, mechanical   S/P MVR (mitral valve replacement)   Chronic anticoagulation, on coumadin for Mechanical AVR and MVR   S/P ICD (internal cardiac defibrillator) procedure   Persistent atrial fibrillation (HCC)   COPD with acute exacerbation (HCC)   Type 2 diabetes mellitus with hypoglycemia without coma (HCC)   Acute systolic CHF (congestive heart failure) (HCC)   Stage 3b chronic kidney disease (HCC)   Seizure (HCC)   Acute respiratory failure with hypoxia (HCC)  1-Acute hypoxic hypercapnic respiratory failure; in the setting of COPD exacerbation and heart failure exacerbation: Presented hypoxic hypercapnic lethargic she was placed on BiPAP Patient has improved on BiPAP, she is breathing better, she is more alert. Continue with IV Lasix, nebulizer and IV Solu-Medrol.  2-Acute on chronic combined systolic and diastolic heart failure: Continue with Lasix IV twice daily.  Monitor output and  renal function  3-COPD exacerbation: Continue with nebulizer Pulmicort, appreciate pulmonology evaluation  4-Acute Metabolic Encephalopathy: Secondary to respiratory failure improved on BiPAP.  5-History of  mechanical aortic valve and mitral valve replacement on Coumadin: Continue with heparin resume Coumadin.  6-A. fib history of complete heart block s/p pacemaker 7-chronic kidney disease a stage IV; prior cr 1.5--2 Monitor on lasix.  8-hypothyroidism: Continue with Synthroid 9-history of seizure continue with Keppra  Hyperkalemia; on IV lasix.  Repeat labs   Pressure Injury 07/08/20 Buttocks Right Stage 2 -  Partial thickness loss of dermis presenting as a shallow open injury with a red, pink wound bed without slough. 2 cm x 2cm (Active)  07/08/20 0731  Location: Buttocks  Location Orientation: Right  Staging: Stage 2 -  Partial thickness loss of dermis presenting as a shallow open injury with a red, pink wound bed without slough.  Wound Description (Comments): 2 cm x 2cm  Present on Admission: No     Estimated body mass index is 30.14 kg/m as calculated from the following:   Height as of this encounter: 5\' 7"  (1.702 m).   Weight as of this encounter: 87.3 kg.   DVT prophylaxis: heparin  Code Status: full code Family Communication: niece updated Disposition Plan:  Status is: Inpatient  Remains inpatient appropriate because:IV treatments appropriate due to intensity of illness or inability to take PO   Dispo: The patient is from: Home              Anticipated d/c is to: Home              Patient currently is  not medically stable to d/c.   Difficult to place patient No        Consultants:   CCM   Procedures:   NONE  Antimicrobials:    Subjective: She was alert, she is breathing better.    Objective: Vitals:   01/19/21 0305 01/19/21 0400 01/19/21 0500 01/19/21 0600  BP:  (!) 159/52 (!) 158/53 (!) 166/80  Pulse:  (!) 59 (!) 59 62  Resp:  16 16  (!) 22  Temp:      TempSrc:      SpO2: 95% 100% 100% 100%  Weight:   87.3 kg   Height:        Intake/Output Summary (Last 24 hours) at 01/19/2021 0751 Last data filed at 01/19/2021 0600 Gross per 24 hour  Intake 460.16 ml  Output 875 ml  Net -414.84 ml   Filed Weights   01/18/21 1612 01/19/21 0500  Weight: 90.7 kg 87.3 kg    Examination:  General exam: Appears calm and comfortable  Respiratory system: Clear to auscultation. Respiratory effort normal. Cardiovascular system: S1 & S2 heard, IRR Gastrointestinal system: Abdomen is nondistended, soft and nontender. No organomegaly or masses felt. Normal bowel sounds heard. Central nervous system: Alert  Extremities: Symmetric 5 x 5 power.   Data Reviewed: I have personally reviewed following labs and imaging studies  CBC: Recent Labs  Lab 01/18/21 1114 01/18/21 1656 01/18/21 1945 01/19/21 0718  WBC 5.1 5.4  --  4.3  NEUTROABS 3.7 4.3  --   --   HGB 10.5* 10.7* 9.2* 10.2*  HCT 35.9* 36.4 27.0* 34.6*  MCV 93.0 92.6  --  92.3  PLT 166 193  --  096*   Basic Metabolic Panel: Recent Labs  Lab 01/18/21 1142 01/18/21 1930 01/18/21 1945  NA 139 143 142  K 4.6 4.5 4.7  CL 99 107 101  CO2 29 28  --   GLUCOSE 104* 112* 115*  BUN 87* 83* 96*  CREATININE 2.84* 2.44* 2.80*  CALCIUM 9.5 7.4*  --   PHOS 4.6  --   --    GFR: Estimated Creatinine Clearance: 16.4 mL/min (A) (by C-G formula based on SCr of 2.8 mg/dL (H)). Liver Function Tests: Recent Labs  Lab 01/18/21 1142 01/18/21 1930  AST  --  32  ALT  --  20  ALKPHOS  --  38  BILITOT  --  0.4  PROT  --  5.7*  ALBUMIN 3.9 2.8*   No results for input(s): LIPASE, AMYLASE in the last 168 hours. No results for input(s): AMMONIA in the last 168 hours. Coagulation Profile: Recent Labs  Lab 01/13/21 1131 01/18/21 1656  INR 3.0 1.6*   Cardiac Enzymes: No results for input(s): CKTOTAL, CKMB, CKMBINDEX, TROPONINI in the last 168 hours. BNP (last 3 results) No  results for input(s): PROBNP in the last 8760 hours. HbA1C: No results for input(s): HGBA1C in the last 72 hours. CBG: Recent Labs  Lab 01/18/21 2008 01/18/21 2356 01/19/21 0507  GLUCAP 116* 84 121*   Lipid Profile: No results for input(s): CHOL, HDL, LDLCALC, TRIG, CHOLHDL, LDLDIRECT in the last 72 hours. Thyroid Function Tests: No results for input(s): TSH, T4TOTAL, FREET4, T3FREE, THYROIDAB in the last 72 hours. Anemia Panel: No results for input(s): VITAMINB12, FOLATE, FERRITIN, TIBC, IRON, RETICCTPCT in the last 72 hours. Sepsis Labs: No results for input(s): PROCALCITON, LATICACIDVEN in the last 168 hours.  Recent Results (from the past 240 hour(s))  Resp Panel by RT-PCR (Flu A&B, Covid)  Nasopharyngeal Swab     Status: None   Collection Time: 01/18/21  5:08 PM   Specimen: Nasopharyngeal Swab; Nasopharyngeal(NP) swabs in vial transport medium  Result Value Ref Range Status   SARS Coronavirus 2 by RT PCR NEGATIVE NEGATIVE Final    Comment: (NOTE) SARS-CoV-2 target nucleic acids are NOT DETECTED.  The SARS-CoV-2 RNA is generally detectable in upper respiratory specimens during the acute phase of infection. The lowest concentration of SARS-CoV-2 viral copies this assay can detect is 138 copies/mL. A negative result does not preclude SARS-Cov-2 infection and should not be used as the sole basis for treatment or other patient management decisions. A negative result may occur with  improper specimen collection/handling, submission of specimen other than nasopharyngeal swab, presence of viral mutation(s) within the areas targeted by this assay, and inadequate number of viral copies(<138 copies/mL). A negative result must be combined with clinical observations, patient history, and epidemiological information. The expected result is Negative.  Fact Sheet for Patients:  EntrepreneurPulse.com.au  Fact Sheet for Healthcare Providers:   IncredibleEmployment.be  This test is no t yet approved or cleared by the [place] FDA and  has been authorized for detection and/or diagnosis of SARS-CoV-2 by FDA under an Emergency Use Authorization (EUA). This EUA will remain  in effect (meaning this test can be used) for the duration of the COVID-19 declaration under Section 564(b)(1) of the Act, 21 U.S.C.section 360bbb-3(b)(1), unless the authorization is terminated  or revoked sooner.       Influenza A by PCR NEGATIVE NEGATIVE Final   Influenza B by PCR NEGATIVE NEGATIVE Final    Comment: (NOTE) The Xpert Xpress SARS-CoV-2/FLU/RSV plus assay is intended as an aid in the diagnosis of influenza from Nasopharyngeal swab specimens and should not be used as a sole basis for treatment. Nasal washings and aspirates are unacceptable for Xpert Xpress SARS-CoV-2/FLU/RSV testing.  Fact Sheet for Patients: EntrepreneurPulse.com.au  Fact Sheet for Healthcare Providers: IncredibleEmployment.be  This test is not yet approved or cleared by the [place] FDA and has been authorized for detection and/or diagnosis of SARS-CoV-2 by FDA under an Emergency Use Authorization (EUA). This EUA will remain in effect (meaning this test can be used) for the duration of the COVID-19 declaration under Section 564(b)(1) of the Act, 21 U.S.C. section 360bbb-3(b)(1), unless the authorization is terminated or revoked.  Performed at [facility], [place] [address], [place], [place] [address]   MRSA PCR Screening     Status: None   Collection Time: 01/18/21  9:58 PM   Specimen: Nasal Mucosa; Nasopharyngeal  Result Value Ref Range Status   MRSA by PCR NEGATIVE NEGATIVE Final    Comment:        The GeneXpert MRSA Assay (FDA approved for NASAL specimens only), is one component of a comprehensive MRSA colonization surveillance program. It is not intended to diagnose  MRSA infection nor to guide or monitor treatment for MRSA infections. Performed at [facility], [place] [address] [place], [place] [address]          Radiology Studies: US Renal  Result Date: 01/18/2021 CLINICAL DATA:  Renal failure. EXAM: RENAL / URINARY TRACT ULTRASOUND COMPLETE COMPARISON:  Abdominal CT 06/26/2016. FINDINGS: Right Kidney: Renal measurements: 9.9 x 4.1 x 4.5 cm = volume: 95 mL. Mild hydronephrosis. Renal parenchymal thinning with increased parenchymal echogenicity. 1.1 cm cyst in the mid kidney. Left Kidney: Very limited evaluation due to shadowing bowel gas. Renal measurements: 10.4 x 5.1 x 4.9  cm = volume: 136 mL. No gross hydronephrosis. More detailed assessment is limited. Cyst on prior CT is not seen. Bladder: Appears normal for degree of bladder distention. Neither ureteral jet is seen. Other: None. IMPRESSION: 1. Mild right hydronephrosis. 2. Right renal parenchymal thinning and increased echogenicity consistent with chronic medical renal disease. 3. Significantly limited evaluation of the left kidney, no gross hydronephrosis. Electronically Signed   By: Keith Rake M.D.   On: 01/18/2021 18:26   DG Chest Port 1 View  Result Date: 01/18/2021 CLINICAL DATA:  87 year old female with shortness of breath. EXAM: PORTABLE CHEST 1 VIEW COMPARISON:  Chest radiograph dated 12/31/2020. FINDINGS: There is cardiomegaly with mild central vascular congestion. No focal consolidation, pleural effusion, or pneumothorax. Left pectoral AICD device. Atherosclerotic calcification of the aorta. Median sternotomy wires. No acute osseous pathology. IMPRESSION: Cardiomegaly with mild central vascular congestion. No focal consolidation. Electronically Signed   By: Anner Crete M.D.   On: 01/18/2021 18:32        Scheduled Meds: . budesonide (PULMICORT) nebulizer solution  0.5 mg Nebulization BID  . chlorhexidine  15 mL Mouth Rinse BID  . Chlorhexidine Gluconate  Cloth  6 each Topical Daily  . furosemide  40 mg Intravenous Q12H  . ipratropium-albuterol  3 mL Nebulization Q4H  . levothyroxine  44 mcg Intravenous Daily  . mouth rinse  15 mL Mouth Rinse q12n4p  . methylPREDNISolone (SOLU-MEDROL) injection  80 mg Intravenous Q12H  . metoprolol tartrate  5 mg Intravenous Q6H   Continuous Infusions: . azithromycin Stopped (01/18/21 2335)  . heparin 1,300 Units/hr (01/19/21 0027)  . levETIRAcetam Stopped (01/19/21 0010)     LOS: 1 day    Time spent: 35 minutes.     Elmarie Shiley, MD [facility]   If 7PM-7AM, please contact night-coverage [email or web address]  01/19/2021, 7:51 AM

## 2021-01-20 ENCOUNTER — Inpatient Hospital Stay (HOSPITAL_COMMUNITY): Payer: Medicare Other

## 2021-01-20 DIAGNOSIS — J9602 Acute respiratory failure with hypercapnia: Secondary | ICD-10-CM | POA: Diagnosis not present

## 2021-01-20 DIAGNOSIS — J9601 Acute respiratory failure with hypoxia: Secondary | ICD-10-CM | POA: Diagnosis not present

## 2021-01-20 LAB — HEPARIN LEVEL (UNFRACTIONATED): Heparin Unfractionated: 0.3 IU/mL (ref 0.30–0.70)

## 2021-01-20 LAB — BASIC METABOLIC PANEL
Anion gap: 9 (ref 5–15)
BUN: 93 mg/dL — ABNORMAL HIGH (ref 8–23)
CO2: 30 mmol/L (ref 22–32)
Calcium: 8.9 mg/dL (ref 8.9–10.3)
Chloride: 102 mmol/L (ref 98–111)
Creatinine, Ser: 2.27 mg/dL — ABNORMAL HIGH (ref 0.44–1.00)
GFR, Estimated: 21 mL/min — ABNORMAL LOW (ref >60)
Glucose, Bld: 119 mg/dL — ABNORMAL HIGH (ref 70–99)
Potassium: 4.5 mmol/L (ref 3.5–5.1)
Sodium: 141 mmol/L (ref 135–145)

## 2021-01-20 LAB — CBC
HCT: 27.8 % — ABNORMAL LOW (ref 36.0–46.0)
Hemoglobin: 8.5 g/dL — ABNORMAL LOW (ref 12.0–15.0)
MCH: 27.5 pg (ref 26.0–34.0)
MCHC: 30.6 g/dL (ref 30.0–36.0)
MCV: 90 fL (ref 80.0–100.0)
Platelets: 159 10*3/uL (ref 150–400)
RBC: 3.09 MIL/uL — ABNORMAL LOW (ref 3.87–5.11)
RDW: 18.9 % — ABNORMAL HIGH (ref 11.5–15.5)
WBC: 6 10*3/uL (ref 4.0–10.5)
nRBC: 0.7 % — ABNORMAL HIGH (ref 0.0–0.2)

## 2021-01-20 LAB — GLUCOSE, CAPILLARY
Glucose-Capillary: 143 mg/dL — ABNORMAL HIGH (ref 70–99)
Glucose-Capillary: 127 mg/dL — ABNORMAL HIGH (ref 70–99)
Glucose-Capillary: 121 mg/dL — ABNORMAL HIGH (ref 70–99)
Glucose-Capillary: 192 mg/dL — ABNORMAL HIGH (ref 70–99)

## 2021-01-20 LAB — PROTIME-INR
INR: 2.4 — ABNORMAL HIGH (ref 0.8–1.2)
Prothrombin Time: 25.9 seconds — ABNORMAL HIGH (ref 11.4–15.2)

## 2021-01-20 MED ORDER — LEVETIRACETAM 500 MG PO TABS
500.0000 mg | ORAL_TABLET | Freq: Two times a day (BID) | ORAL | Status: DC
  Administered 2021-01-20 – 2021-01-28 (×17): 500 mg via ORAL
  Filled 2021-01-20 (×17): qty 1

## 2021-01-20 MED ORDER — AZITHROMYCIN 250 MG PO TABS
500.0000 mg | ORAL_TABLET | Freq: Every day | ORAL | Status: AC
  Administered 2021-01-20 – 2021-01-22 (×3): 500 mg via ORAL
  Filled 2021-01-20 (×3): qty 2

## 2021-01-20 MED ORDER — WARFARIN SODIUM 5 MG PO TABS
5.0000 mg | ORAL_TABLET | Freq: Once | ORAL | Status: AC
  Administered 2021-01-20: 5 mg via ORAL
  Filled 2021-01-20: qty 1

## 2021-01-20 MED ORDER — GABAPENTIN 300 MG PO CAPS
300.0000 mg | ORAL_CAPSULE | Freq: Two times a day (BID) | ORAL | Status: DC
  Administered 2021-01-20 – 2021-01-28 (×17): 300 mg via ORAL
  Filled 2021-01-20 (×17): qty 1

## 2021-01-20 MED ORDER — ONDANSETRON HCL 4 MG PO TABS
4.0000 mg | ORAL_TABLET | Freq: Three times a day (TID) | ORAL | Status: DC | PRN
  Administered 2021-01-20 – 2021-01-22 (×2): 4 mg via ORAL
  Filled 2021-01-20 (×2): qty 1

## 2021-01-20 MED ORDER — HYDRALAZINE HCL 10 MG PO TABS
10.0000 mg | ORAL_TABLET | Freq: Two times a day (BID) | ORAL | Status: DC
  Administered 2021-01-20 – 2021-01-24 (×9): 10 mg via ORAL
  Filled 2021-01-20 (×10): qty 1

## 2021-01-20 MED ORDER — LEVOTHYROXINE SODIUM 88 MCG PO TABS
88.0000 ug | ORAL_TABLET | Freq: Every day | ORAL | Status: DC
  Administered 2021-01-21 – 2021-01-28 (×8): 88 ug via ORAL
  Filled 2021-01-20 (×8): qty 1

## 2021-01-20 MED ORDER — METOPROLOL TARTRATE 50 MG PO TABS
50.0000 mg | ORAL_TABLET | Freq: Two times a day (BID) | ORAL | Status: DC
  Administered 2021-01-20 – 2021-01-22 (×6): 50 mg via ORAL
  Filled 2021-01-20: qty 2
  Filled 2021-01-20: qty 1
  Filled 2021-01-20: qty 2
  Filled 2021-01-20 (×3): qty 1

## 2021-01-20 NOTE — Progress Notes (Addendum)
PROGRESS NOTE    [name] [name]  HMC:[id number] DOB: [birth year] DOA: 01/18/2021 PCP: Fayrene Helper, MD   Brief Narrative: 87 year old with past medical history significant for COPD, chronic combined systolic and diastolic heart failure, complete heart block, A. fib status post pacemaker and ICD history of mechanical aortic valve on Coumadin, chronic kidney disease a stage III, hypothyroidism, seizure who presents with shortness of breath.  Patient received a dose of Retacrit prior to coming to the hospital.  Patient has been having shortness of breath for the last 2 days prior to admission.  Evaluation in the ED patient was found to be lethargic initial ABG showed pH of 7.2 a PCO2 of 80, chest x-ray which pulmonary congestion COVID test negative, patient was placed on BiPAP.  She was a started on IV Lasix nebulizer.  CCM was consulted. Patient improved on BIPAP, and treatment for Heart failure and COPD exacerbation.     Assessment & Plan:   Principal Problem:   Acute respiratory failure with hypoxia and hypercapnia (HCC) Active Problems:   Hx of aortic valve replacement, mechanical   S/P MVR (mitral valve replacement)   Chronic anticoagulation, on coumadin for Mechanical AVR and MVR   S/P ICD (internal cardiac defibrillator) procedure   Persistent atrial fibrillation (HCC)   COPD with acute exacerbation (HCC)   Type 2 diabetes mellitus with hypoglycemia without coma (HCC)   Acute systolic CHF (congestive heart failure) (HCC)   Stage 3b chronic kidney disease (HCC)   Seizure (HCC)   Acute respiratory failure with hypoxia (HCC)  1-Acute hypoxic hypercapnic respiratory failure; in the setting of COPD exacerbation and heart failure exacerbation: Presented hypoxic hypercapnic lethargic she was placed on BiPAP. Patient  improved on BiPAP. Off BIPAP.  Continue with IV Lasix, nebulizer. Solumedrol change to prednisone for 4 days taper.  -Incentive spirometry.   2-Acute on chronic  combined systolic and diastolic heart failure: Continue with Lasix IV twice daily.  Monitor output and renal function Urine out put : 2l  Weight: 200---192 Chest x ray with 4/28 mild interstitial prominence. Mild interstitial edema. Bibasilar atelectasis.  -Continue with Diuretics, Added incentive spirometry.  -plan to check ECHO.  -Might need to discussed with cardiology out patient regimen for diuretics. Her lasix was increase out patient without significant improvement.   3-COPD exacerbation:  Continue with nebulizer Pulmicort, appreciate pulmonology evaluation  4-Acute Metabolic Encephalopathy: Secondary to respiratory failure improved on BiPAP. Alert, and oriented. Conversant.   5-History of  mechanical aortic valve and mitral valve replacement on Coumadin:  Pharmacy adjusting coumadin.   6-A. fib history of complete heart block s/p pacemaker. Change to oral metoprolol.  7-chronic kidney disease a stage IV; prior cr 1.5--2 Monitor on lasix.  Cr stable today at 2.2.  8-Hypothyroidism: Continue with Synthroid 9-History of seizure continue with Keppra  Hyperkalemia; on IV lasix.  Resolved.  Anemia of chronic diseases; Monitor hb.   Pressure Injury 07/08/20 Buttocks Right Stage 2 -  Partial thickness loss of dermis presenting as a shallow open injury with a red, pink wound bed without slough. 2 cm x 2cm (Active)  07/08/20 0731  Location: Buttocks  Location Orientation: Right  Staging: Stage 2 -  Partial thickness loss of dermis presenting as a shallow open injury with a red, pink wound bed without slough.  Wound Description (Comments): 2 cm x 2cm  Present on Admission: No     Estimated body mass index is 30.14 kg/m as calculated from the following:   Height as  of this encounter: 5\' 7"  (1.702 m).   Weight as of this encounter: 87.3 kg.   DVT prophylaxis: heparin  Code Status: full code Family Communication: niece updated 4/27 Disposition Plan:  Status is:  Inpatient  Remains inpatient appropriate because:IV treatments appropriate due to intensity of illness or inability to take PO   Dispo: The patient is from: Home              Anticipated d/c is to: Home              Patient currently is not medically stable to d/c.   Difficult to place patient No        Consultants:   CCM   Procedures:   NONE  Antimicrobials:    Subjective: She is sitting in bed, ready to start working with PT> she is breathing better.    Objective: Vitals:   01/20/21 0600 01/20/21 0700 01/20/21 0735 01/20/21 0800  BP: 131/84 (!) 145/72  (!) 171/65  Pulse: 60 60  70  Resp: 17 (!) 25  (!) 26  Temp:   97.8 F (36.6 C)   TempSrc:   Oral   SpO2: 97% 95%  100%  Weight:      Height:        Intake/Output Summary (Last 24 hours) at 01/20/2021 0810 Last data filed at 01/20/2021 0600 Gross per 24 hour  Intake 1192.74 ml  Output 2000 ml  Net -807.26 ml   Filed Weights   01/18/21 1612 01/19/21 0500  Weight: 90.7 kg 87.3 kg    Examination:  General exam: NAD Respiratory system; BL crackles.  Cardiovascular system: S 1, S 2  Gastrointestinal system: BS present, soft, nt Central nervous system: Alert, following command Extremities: Symmetric power   Data Reviewed: I have personally reviewed following labs and imaging studies  CBC: Recent Labs  Lab 01/18/21 1114 01/18/21 1656 01/18/21 1945 01/19/21 0718 01/19/21 1821 01/20/21 0238  WBC 5.1 5.4  --  4.3 4.1 6.0  NEUTROABS 3.7 4.3  --   --   --   --   HGB 10.5* 10.7* 9.2* 10.2* 9.9* 8.5*  HCT 35.9* 36.4 27.0* 34.6* 33.5* 27.8*  MCV 93.0 92.6  --  92.3 92.0 90.0  PLT 166 193  --  148* 169 161   Basic Metabolic Panel: Recent Labs  Lab 01/18/21 1142 01/18/21 1930 01/18/21 1945 01/19/21 0718 01/20/21 0238  NA 139 143 142 145 141  K 4.6 4.5 4.7 5.5* 4.5  CL 99 107 101 102 102  CO2 29 28  --  34* 30  GLUCOSE 104* 112* 115* 147* 119*  BUN 87* 83* 96* 96* 93*  CREATININE 2.84*  2.44* 2.80* 2.73* 2.27*  CALCIUM 9.5 7.4*  --  9.1 8.9  PHOS 4.6  --   --   --   --    GFR: Estimated Creatinine Clearance: 20.2 mL/min (A) (by C-G formula based on SCr of 2.27 mg/dL (H)). Liver Function Tests: Recent Labs  Lab 01/18/21 1142 01/18/21 1930 01/19/21 0718  AST  --  32 41  ALT  --  20 25  ALKPHOS  --  38 46  BILITOT  --  0.4 0.4  PROT  --  5.7* 7.7  ALBUMIN 3.9 2.8* 3.7   No results for input(s): LIPASE, AMYLASE in the last 168 hours. Recent Labs  Lab 01/19/21 0718  AMMONIA 19   Coagulation Profile: Recent Labs  Lab 01/13/21 1131 01/18/21 1656 01/19/21 0960 01/20/21 0238  INR 3.0 1.6* 1.8* 2.4*   Cardiac Enzymes: No results for input(s): CKTOTAL, CKMB, CKMBINDEX, TROPONINI in the last 168 hours. BNP (last 3 results) No results for input(s): PROBNP in the last 8760 hours. HbA1C: No results for input(s): HGBA1C in the last 72 hours. CBG: Recent Labs  Lab 01/19/21 0507 01/19/21 1148 01/19/21 1703 01/20/21 0036 01/20/21 0702  GLUCAP 121* 296* 122* 143* 127*   Lipid Profile: No results for input(s): CHOL, HDL, LDLCALC, TRIG, CHOLHDL, LDLDIRECT in the last 72 hours. Thyroid Function Tests: No results for input(s): TSH, T4TOTAL, FREET4, T3FREE, THYROIDAB in the last 72 hours. Anemia Panel: No results for input(s): VITAMINB12, FOLATE, FERRITIN, TIBC, IRON, RETICCTPCT in the last 72 hours. Sepsis Labs: No results for input(s): PROCALCITON, LATICACIDVEN in the last 168 hours.  Recent Results (from the past 240 hour(s))  Resp Panel by RT-PCR (Flu A&B, Covid) Nasopharyngeal Swab     Status: None   Collection Time: 01/18/21  5:08 PM   Specimen: Nasopharyngeal Swab; Nasopharyngeal(NP) swabs in vial transport medium  Result Value Ref Range Status   SARS Coronavirus 2 by RT PCR NEGATIVE NEGATIVE Final    Comment: (NOTE) SARS-CoV-2 target nucleic acids are NOT DETECTED.  The SARS-CoV-2 RNA is generally detectable in upper respiratory specimens during  the acute phase of infection. The lowest concentration of SARS-CoV-2 viral copies this assay can detect is 138 copies/mL. A negative result does not preclude SARS-Cov-2 infection and should not be used as the sole basis for treatment or other patient management decisions. A negative result may occur with  improper specimen collection/handling, submission of specimen other than nasopharyngeal swab, presence of viral mutation(s) within the areas targeted by this assay, and inadequate number of viral copies(<138 copies/mL). A negative result must be combined with clinical observations, patient history, and epidemiological information. The expected result is Negative.  Fact Sheet for Patients:  EntrepreneurPulse.com.au  Fact Sheet for Healthcare Providers:  IncredibleEmployment.be  This test is no t yet approved or cleared by the [place] FDA and  has been authorized for detection and/or diagnosis of SARS-CoV-2 by FDA under an Emergency Use Authorization (EUA). This EUA will remain  in effect (meaning this test can be used) for the duration of the COVID-19 declaration under Section 564(b)(1) of the Act, 21 U.S.C.section 360bbb-3(b)(1), unless the authorization is terminated  or revoked sooner.       Influenza A by PCR NEGATIVE NEGATIVE Final   Influenza B by PCR NEGATIVE NEGATIVE Final    Comment: (NOTE) The Xpert Xpress SARS-CoV-2/FLU/RSV plus assay is intended as an aid in the diagnosis of influenza from Nasopharyngeal swab specimens and should not be used as a sole basis for treatment. Nasal washings and aspirates are unacceptable for Xpert Xpress SARS-CoV-2/FLU/RSV testing.  Fact Sheet for Patients: EntrepreneurPulse.com.au  Fact Sheet for Healthcare Providers: IncredibleEmployment.be  This test is not yet approved or cleared by the [place] FDA and has been authorized for detection and/or  diagnosis of SARS-CoV-2 by FDA under an Emergency Use Authorization (EUA). This EUA will remain in effect (meaning this test can be used) for the duration of the COVID-19 declaration under Section 564(b)(1) of the Act, 21 U.S.C. section 360bbb-3(b)(1), unless the authorization is terminated or revoked.  Performed at [facility], [place] [address], [place], [place] [address]   MRSA PCR Screening     Status: None   Collection Time: 01/18/21  9:58 PM   Specimen: Nasal Mucosa; Nasopharyngeal  Result Value Ref Range Status  MRSA by PCR NEGATIVE NEGATIVE Final    Comment:        The GeneXpert MRSA Assay (FDA approved for NASAL specimens only), is one component of a comprehensive MRSA colonization surveillance program. It is not intended to diagnose MRSA infection nor to guide or monitor treatment for MRSA infections. Performed at [facility], [place] [address], [place], [place] [address]          Radiology Studies: US Renal  Result Date: 01/18/2021 CLINICAL DATA:  Renal failure. EXAM: RENAL / URINARY TRACT ULTRASOUND COMPLETE COMPARISON:  Abdominal CT 06/26/2016. FINDINGS: Right Kidney: Renal measurements: 9.9 x 4.1 x 4.5 cm = volume: 95 mL. Mild hydronephrosis. Renal parenchymal thinning with increased parenchymal echogenicity. 1.1 cm cyst in the mid kidney. Left Kidney: Very limited evaluation due to shadowing bowel gas. Renal measurements: 10.4 x 5.1 x 4.9 cm = volume: 136 mL. No gross hydronephrosis. More detailed assessment is limited. Cyst on prior CT is not seen. Bladder: Appears normal for degree of bladder distention. Neither ureteral jet is seen. Other: None. IMPRESSION: 1. Mild right hydronephrosis. 2. Right renal parenchymal thinning and increased echogenicity consistent with chronic medical renal disease. 3. Significantly limited evaluation of the left kidney, no gross hydronephrosis. Electronically Signed   By: Keith Rake M.D.   On:  01/18/2021 18:26   DG Chest Port 1 View  Result Date: 01/20/2021 CLINICAL DATA:  Pulmonary edema. EXAM: PORTABLE CHEST 1 VIEW COMPARISON:  Chest x-ray 01/18/2021. FINDINGS: AICD in stable position. Prior cardiac valve replacement. Stable prominent cardiomegaly. Mild bilateral interstitial prominence. Mild interstitial edema and/or pneumonitis cannot be excluded. Progressive bibasilar atelectasis. Small left pleural effusion cannot be excluded. No pneumothorax. IMPRESSION: 1. AICD in stable position. Prior cardiac valve replacement. Stable prominent cardiomegaly. Mild bilateral interstitial prominence. Mild interstitial edema and/or pneumonitis cannot be excluded. 2. Progressive bibasilar atelectasis. Small left pleural effusion cannot be excluded. Electronically Signed   By: Marcello Moores  Register   On: 01/20/2021 05:44   DG Chest Port 1 View  Result Date: 01/18/2021 CLINICAL DATA:  87 year old female with shortness of breath. EXAM: PORTABLE CHEST 1 VIEW COMPARISON:  Chest radiograph dated 12/31/2020. FINDINGS: There is cardiomegaly with mild central vascular congestion. No focal consolidation, pleural effusion, or pneumothorax. Left pectoral AICD device. Atherosclerotic calcification of the aorta. Median sternotomy wires. No acute osseous pathology. IMPRESSION: Cardiomegaly with mild central vascular congestion. No focal consolidation. Electronically Signed   By: Anner Crete M.D.   On: 01/18/2021 18:32        Scheduled Meds: . budesonide (PULMICORT) nebulizer solution  0.5 mg Nebulization BID  . chlorhexidine  15 mL Mouth Rinse BID  . Chlorhexidine Gluconate Cloth  6 each Topical Daily  . furosemide  40 mg Intravenous Q12H  . hydrALAZINE  10 mg Oral BID  . ipratropium-albuterol  3 mL Nebulization TID  . levothyroxine  44 mcg Intravenous Daily  . mouth rinse  15 mL Mouth Rinse q12n4p  . metoprolol tartrate  5 mg Intravenous Q6H  . predniSONE  40 mg Oral Q breakfast  . warfarin  5 mg Oral  ONCE-1600  . Warfarin - Pharmacist Dosing Inpatient   Does not apply q1600   Continuous Infusions: . azithromycin Stopped (01/19/21 2230)  . heparin 1,100 Units/hr (01/20/21 0300)  . levETIRAcetam Stopped (01/20/21 0000)     LOS: 2 days    Time spent: 35 minutes.     Elmarie Shiley, MD [facility]   If 7PM-7AM, please contact night-coverage  [email or web address]  01/20/2021, 8:10 AM

## 2021-01-20 NOTE — Evaluation (Signed)
Occupational Therapy Evaluation Patient Details Name: [name] [name] MRN: [id number] DOB: 1932-09-01 Today's Date: 01/20/2021    History of Present Illness Patient is an 87 year old female  PMH significant for COPD, chronic combined systolic and diastolic heart failure, complete heart block, A. fib status post pacemaker and ICD history of mechanical aortic valve on Coumadin, chronic kidney disease a stage III, hypothyroidism, seizure who presents with shortness of breath.   Clinical Impression   Patient lives with nephew in a single level house with ramp entry. Patient has caregiver assist 5 days/week from 9-1pm to help get breakfast, bathing, dressing "whatever little thing I need." Patient's nephew works from [employer] Patient states at baseline can ambulate with walker short distances then gets tired, and can use toilet when needed. Currently patient with overall decreased activity tolerance and safety needing mod A for bed mobility and stand pivot to recliner with walker. Vital signs stable on 2L, patient reports wears 2.5L at home. Recommend continued acute OT services to maximize patient endurance, safety and global strength in order to facilitate D/C to venue listed below. Unsure if patient's family can arrange 24/7 support at home initially during recovery, if unable would recommend [facility].    Follow Up Recommendations  SNF;Other (comment) (unless 24/7 is available at home then [facility])    Equipment Recommendations  None recommended by OT       Precautions / Restrictions Precautions Precautions: Fall Restrictions Weight Bearing Restrictions: No      Mobility Bed Mobility Overal bed mobility: Needs Assistance Bed Mobility: Supine to Sit     Supine to sit: Mod assist;HOB elevated     General bed mobility comments: for trunk support    Transfers Overall transfer level: Needs assistance Equipment used: Rolling walker (2 wheeled) Transfers: Stand Pivot Transfers   Stand  pivot transfers: Mod assist;From elevated surface       General transfer comment: mod A to power up to standing and complete pivot transfer to recliner due to decreased activity tolerance    Balance Overall balance assessment: Needs assistance Sitting-balance support: Feet supported Sitting balance-Leahy Scale: Fair     Standing balance support: Bilateral upper extremity supported Standing balance-Leahy Scale: Poor Standing balance comment: reliant on external assist and UE support of walker                           ADL either performed or assessed with clinical judgement   ADL Overall ADL's : Needs assistance/impaired     Grooming: Set up;Sitting   Upper Body Bathing: Minimal assistance;Sitting   Lower Body Bathing: Moderate assistance;Sitting/lateral leans;Sit to/from stand   Upper Body Dressing : Minimal assistance;Sitting   Lower Body Dressing: Maximal assistance;Sitting/lateral leans;Sit to/from stand Lower Body Dressing Details (indicate cue type and reason): patient does have assist at baseline with dressing/bathing Toilet Transfer: Moderate assistance;Cueing for safety;Cueing for sequencing;Stand-pivot;RW Toilet Transfer Details (indicate cue type and reason): patient able to support her weight however weak needing mod A to safely complete pivot transfer to [place] and Hygiene: Moderate assistance;Sitting/lateral lean;Sit to/from stand       Functional mobility during ADLs: Moderate assistance;Cueing for safety;Rolling walker General ADL Comments: patient does have assist at baseline with dressing, bathing, IADLs however appears below baseline with functional ambulation and toileting as patient states she can ambulate to bathroom independently at baseline with walker. 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, severely limited AROM shoulder flexion ~ 20 degrees   Lower Extremity Assessment Lower Extremity Assessment: Defer to PT evaluation   Cervical / Trunk Assessment Cervical / Trunk Assessment: Kyphotic   Communication Communication Communication: No difficulties   Cognition Arousal/Alertness: Awake/alert Behavior During Therapy: WFL for tasks assessed/performed Overall Cognitive Status: Within Functional Limits for tasks assessed                                 General Comments: grossly oriented and following directions appropriately   General Comments  pt on 2.5 L O2 at home, vitals stable during transfer            [place] expects to be discharged to:: Private residence Living Arrangements: Other (Comment) (nephew) Available Help at Discharge: Family;Personal care attendant Type of Home: House Home Access: Ramped entrance     Home Layout: One level     Bathroom Shower/Tub: Teacher, early years/pre: Standard     Home Equipment: Cane - single point;Walker - 2 wheels;Bedside commode;Shower seat   Additional Comments: nephew works 6PM to [employer]      Prior Functioning/Environment Level of Independence: Needs Product/process development scientist / Transfers Assistance Needed: short distance ambulator with rolling walker ADL's / Homemaking Assistance Needed: home aides 5 days/week from 9:am to 1pm, assist with breakfast, bathing, dressing. patients state she can ambulate to bathroom when needed            OT Problem List: Decreased strength;Decreased activity tolerance;Impaired balance (sitting and/or  standing);Pain;Obesity;Decreased safety awareness      OT Treatment/Interventions: Self-care/ADL training;Therapeutic activities;Patient/family education;Balance training    OT Goals(Current goals can be found in the care plan section) Acute Rehab OT Goals Patient Stated Goal: get moving OT Goal Formulation: With patient Time For Goal Achievement: 02/03/21 Potential to Achieve Goals: Good  OT Frequency: Min 2X/week   Barriers to D/C:  unsure if 24/7 assist is available at home       Co-evaluation PT/OT/SLP Co-Evaluation/Treatment: Yes Reason for Co-Treatment: To address functional/ADL transfers;For patient/therapist safety   OT goals addressed during session: ADL's and self-care      AM-PAC OT "6 Clicks" Daily Activity     Outcome Measure Help from another person eating meals?: A Little Help from another person taking care of personal grooming?: A Little Help from another person toileting, which includes using toliet, bedpan, or urinal?: A Lot Help from another person bathing (including washing, rinsing, drying)?: A Lot Help from another person to put on and taking off regular upper body clothing?: A Little Help from another person to put on and taking off regular lower body clothing?: A Lot 6 Click Score: 15   End of Session Equipment Utilized During Treatment: Rolling walker;Gait belt;Oxygen Nurse Communication: Mobility status  Activity Tolerance: Patient tolerated treatment well Patient left: in chair;with call bell/phone within reach;with chair alarm set  OT Visit Diagnosis: Unsteadiness on feet (R26.81);Muscle weakness (generalized) (M62.81);Pain Pain - Right/Left: Right Pain - part of body: Leg  Time: 9969-2493 OT Time Calculation (min): 24 min Charges:  OT General Charges $OT Visit: 1 Visit OT Evaluation $OT Eval Low Complexity: [place] OT OT pager: [phone]  Rosemary Holms 01/20/2021, 9:50 AM

## 2021-01-20 NOTE — Progress Notes (Signed)
Report called to receiving Rn [id number]. PAtient with no complaints at this time. Will transfer to floor via WC. Receiving RN will remove foley per order shortly as lasix was just given.

## 2021-01-20 NOTE — Progress Notes (Signed)
ANTICOAGULATION CONSULT NOTE - Follow Up Consult  Pharmacy Consult for Heparin, Warfarin Indication: aortic and mitral mechanical valve replacement  Allergies  Allergen Reactions  . Penicillins Other (See Comments)    Bruise    . Aspirin Other (See Comments)    On coumadin  . Fentanyl Dermatitis    Rash with patch   . Niacin Itching    Patient Measurements: Height: 5\' 7"  (170.2 cm) Weight: 87.3 kg (192 lb 7.4 oz) IBW/kg (Calculated) : 61.6 Heparin Dosing Weight: 81 kg  Vital Signs: Temp: 98.6 F (37 C) (04/28 0000) Temp Source: Oral (04/28 0000) BP: 137/47 (04/28 0300) Pulse Rate: 60 (04/28 0200)  Labs: Recent Labs    01/18/21 1656 01/18/21 1930 01/18/21 1945 01/19/21 0718 01/19/21 1715 01/19/21 1821 01/20/21 0238  HGB 10.7*  --  9.2* 10.2*  --  9.9* 8.5*  HCT 36.4  --  27.0* 34.6*  --  33.5* 27.8*  PLT 193  --   --  148*  --  169 159  LABPROT 19.0*  --   --  20.8*  --   --  25.9*  INR 1.6*  --   --  1.8*  --   --  2.4*  HEPARINUNFRC  --   --   --  0.80* 0.65  --  0.30  CREATININE  --  2.44* 2.80* 2.73*  --   --  2.27*  TROPONINIHS 28* 37*  --   --   --   --   --     Estimated Creatinine Clearance: 20.2 mL/min (A) (by C-G formula based on SCr of 2.27 mg/dL (H)).   Medications:  Scheduled:  . budesonide (PULMICORT) nebulizer solution  0.5 mg Nebulization BID  . chlorhexidine  15 mL Mouth Rinse BID  . Chlorhexidine Gluconate Cloth  6 each Topical Daily  . furosemide  40 mg Intravenous Q12H  . ipratropium-albuterol  3 mL Nebulization TID  . levothyroxine  44 mcg Intravenous Daily  . mouth rinse  15 mL Mouth Rinse q12n4p  . metoprolol tartrate  5 mg Intravenous Q6H  . predniSONE  40 mg Oral Q breakfast  . warfarin  5 mg Oral ONCE-1600  . Warfarin - Pharmacist Dosing Inpatient   Does not apply q1600   Infusions:  . azithromycin Stopped (01/19/21 2230)  . heparin 1,100 Units/hr (01/20/21 0300)  . levETIRAcetam Stopped (01/20/21 0000)     Assessment: 54 yoF admitted on 4/26 with acute respiratory failure.  PMH significant for CHF, COPD, complete heart block s/p defib pacemaker, CKD-IV, mechanical aortic valve replacement, mitral valve replacement on chronic warfarin anticoagulation.  INR on admission is 1.6.  Pharmacy consulted to begin IV heparin when INR < 2.5 due to NPO status.  Home warfarin 10mg  daily.  LD on 4/25.  01/20/2021   Heparin level 0.3 - therapeutic on heparin 1100 units/hr at lower end of goal range (rate reduced 4/27 pm due to bleeding from IV site)  RN reports no bleeding issues this AM  Hgb = 8.5, PLTC 159  INR = 2.4 (up from 1.8 following warfarin 12.5mg  x 1 dose on 4/27)  Goal of Therapy:   INR 2.5 - 3.5 Heparin level 0.3-0.7 units/ml Monitor platelets by anticoagulation protocol: Yes   Plan:  - Continue heparin IV infusion @ 1100 units/hr to maintain goal range at lower end due to recent bleeding  -  Warfarin 5mg  po x 1 dose today Continue heparin bridge while INR remains < 2.5 Daily INR, heparin level,  and CBC -    Leone Haven, PharmD 01/20/2021,3:58 AM

## 2021-01-20 NOTE — Evaluation (Signed)
Physical Therapy Evaluation Patient Details Name: [name] [name] MRN: [id number] DOB: 11-26-1931 Today's Date: 01/20/2021   History of Present Illness  Patient is an 87 year old female  PMH significant for COPD, chronic combined systolic and diastolic heart failure, complete heart block, A. fib status post pacemaker and ICD history of mechanical aortic valve on Coumadin, chronic kidney disease a stage III, hypothyroidism, seizure who presents with shortness of breath.  Clinical Impression  The patient  Received resting  In bed. SPO2 on 2 l 97%, HR 60 , 171/65.  patient  Assisted with min asssist to sit on bed edge. Able to stand at Rw and  Take small steps to recliner.  Patient comes from home, has CNA/caregiver in AM, nephew works nights,  Patient on her own many hors per day/night.  Patient reports ambulation is  limited when right knee is painful. Pt admitted with above diagnosis.  Pt currently with functional limitations due to the deficits listed below (see PT Problem List). Pt will benefit from skilled PT to increase their independence and safety with mobility to allow discharge to the venue listed below.     Follow Up Recommendations SNF vs home with increased care giver hours.    Equipment Recommendations  None recommended by PT    Recommendations for Other Services       Precautions / Restrictions Precautions Precautions: Fall;ICD/Pacemaker Precaution Comments: monito VS Restrictions Weight Bearing Restrictions: No      Mobility  Bed Mobility Overal bed mobility: Needs Assistance Bed Mobility: Supine to Sit     Supine to sit: Mod assist;HOB elevated     General bed mobility comments: for trunk support    Transfers Overall transfer level: Needs assistance Equipment used: Rolling walker (2 wheeled) Transfers: Stand Pivot Transfers   Stand pivot transfers: Mod assist;From elevated surface       General transfer comment: mod A to power up to standing and  complete pivot transfer to recliner due to decreased activity tolerance  Ambulation/Gait                Stairs            Wheelchair Mobility    Modified Rankin (Stroke Patients Only)       Balance Overall balance assessment: Needs assistance Sitting-balance support: Feet supported Sitting balance-Leahy Scale: Fair     Standing balance support: Bilateral upper extremity supported Standing balance-Leahy Scale: Poor Standing balance comment: reliant on external assist and UE support of walker                             Pertinent Vitals/Pain Pain Assessment: Faces Faces Pain Scale: Hurts little more Pain Location: R leg, heel Pain Descriptors / Indicators: Sore Pain Intervention(s): Monitored during session    Home Living Family/patient expects to be discharged to:: Private residence Living Arrangements:  (lives w/ nephew) Available Help at Discharge: Family;Personal care attendant Type of Home: House Home Access: Ramped entrance     Home Layout: One level Home Equipment: Cane - single point;Walker - 2 wheels;Bedside commode;Shower seat Additional Comments: nephew works 6PM to [employer]    Prior Function Level of Independence: Needs Water engineer / Transfers Assistance Needed: short distance ambulator with rolling walker  ADL's / Homemaking Assistance Needed: home aides 5 days/week from 9:am to 1pm, assist with breakfast, bathing, dressing. 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, severely limited AROM shoulder flexion ~ 20 degrees    Lower Extremity Assessment Lower Extremity Assessment: Generalized weakness;RLE deficits/detail RLE Deficits / Details: reports knee  painful and limits  ambulation    Cervical / Trunk Assessment Cervical / Trunk Assessment: Kyphotic  Communication   Communication: No difficulties  Cognition Arousal/Alertness: Awake/alert Behavior During Therapy: WFL for tasks assessed/performed Overall Cognitive Status: Within Functional Limits for tasks assessed                                 General Comments: grossly oriented and following directions appropriately      General Comments General comments (skin integrity, edema, etc.): pt on 2.5 L O2 at home, vitals stable during transfer    Exercises     Assessment/Plan    PT Assessment Patient needs continued PT services  PT Problem List Decreased strength;Decreased mobility;Decreased activity tolerance;Decreased balance;Decreased knowledge of use of DME;Cardiopulmonary status limiting activity       PT Treatment Interventions DME instruction;Therapeutic activities;Gait training;Therapeutic exercise;Patient/family education;Functional mobility training    PT Goals (Current goals can be found in the Care Plan section)  Acute Rehab PT Goals Patient Stated Goal: get moving PT Goal Formulation: With patient Time For Goal Achievement: 02/03/21 Potential to Achieve Goals: Fair    Frequency Min 2X/week   Barriers to discharge Decreased caregiver support      Co-evaluation PT/OT/SLP Co-Evaluation/Treatment: Yes Reason for Co-Treatment: For patient/therapist safety PT goals addressed during session: Mobility/safety with mobility OT goals addressed during session: ADL's and self-care       AM-PAC PT "6 Clicks" Mobility  Outcome Measure Help needed turning from your back to your side while in a flat bed without using bedrails?: A Little Help needed moving from lying on your back to sitting on the side of a flat bed without using bedrails?: A Little Help needed moving to and from a bed to a chair (including a wheelchair)?: A Lot Help needed standing up from a chair using  your arms (e.g., wheelchair or bedside chair)?: A Lot Help needed to walk in hospital room?: A Lot Help needed climbing 3-5 steps with a railing? : Total 6 Click Score: 13    End of Session Equipment Utilized During Treatment: Gait belt Activity Tolerance: Patient limited by fatigue Patient left: in chair;with call bell/phone within reach;with chair alarm set Nurse Communication: Mobility status PT Visit Diagnosis: Unsteadiness on feet (R26.81);Difficulty in walking, not elsewhere classified (R26.2)    Time: 0802-0830 PT Time Calculation (min) (ACUTE ONLY): 28 min   Charges:   PT Evaluation $PT Eval Low Complexity: [place] PT Acute Rehabilitation Services Pager [phone] Office [phone]   Claretha Cooper 01/20/2021, 1:24 PM

## 2021-01-20 NOTE — Consult Note (Signed)
   [facility] CM Inpatient Consult   01/20/2021  [name] [name] 07-Mar-1932 [id number]  Patient screened for potential post hospital transition needs through [place] [facility] CM) services as noted extreme unplanned readmission risk score.   Primary care is [place] office which has embedded chronic case manager services.   Plan:  Continue to follow progress and disposition to assess for post hospital care management needs.  Of note, [facility] Care Management services does not replace or interfere with any services that are arranged by inpatient case management or social work.  Netta Cedars, MSN, [facility] Liaison Nurse Mobile Phone [phone]  Toll free office [phone]

## 2021-01-21 ENCOUNTER — Inpatient Hospital Stay (HOSPITAL_COMMUNITY): Payer: Medicare Other

## 2021-01-21 ENCOUNTER — Other Ambulatory Visit: Payer: Self-pay | Admitting: Physician Assistant

## 2021-01-21 DIAGNOSIS — Z952 Presence of prosthetic heart valve: Secondary | ICD-10-CM

## 2021-01-21 DIAGNOSIS — I4819 Other persistent atrial fibrillation: Secondary | ICD-10-CM

## 2021-01-21 DIAGNOSIS — J9601 Acute respiratory failure with hypoxia: Secondary | ICD-10-CM | POA: Diagnosis not present

## 2021-01-21 DIAGNOSIS — I5021 Acute systolic (congestive) heart failure: Secondary | ICD-10-CM

## 2021-01-21 DIAGNOSIS — I5043 Acute on chronic combined systolic (congestive) and diastolic (congestive) heart failure: Secondary | ICD-10-CM | POA: Diagnosis not present

## 2021-01-21 DIAGNOSIS — J9602 Acute respiratory failure with hypercapnia: Secondary | ICD-10-CM | POA: Diagnosis not present

## 2021-01-21 LAB — ECHOCARDIOGRAM COMPLETE
AR max vel: 1.85 cm2
AV Area VTI: 1.32 cm2
AV Area mean vel: 1.71 cm2
AV Mean grad: 7 mmHg
AV Peak grad: 16 mmHg
Ao pk vel: 2 m/s
Area-P 1/2: 3.17 cm2
Height: 67 in
MV VTI: 1.17 cm2
S' Lateral: 5.49 cm
Weight: 3227.53 oz

## 2021-01-21 LAB — CBC
HCT: 27.9 % — ABNORMAL LOW (ref 36.0–46.0)
Hemoglobin: 8.5 g/dL — ABNORMAL LOW (ref 12.0–15.0)
MCH: 27.3 pg (ref 26.0–34.0)
MCHC: 30.5 g/dL (ref 30.0–36.0)
MCV: 89.7 fL (ref 80.0–100.0)
Platelets: 157 10*3/uL (ref 150–400)
RBC: 3.11 MIL/uL — ABNORMAL LOW (ref 3.87–5.11)
RDW: 19.2 % — ABNORMAL HIGH (ref 11.5–15.5)
WBC: 7.5 10*3/uL (ref 4.0–10.5)
nRBC: 1.1 % — ABNORMAL HIGH (ref 0.0–0.2)

## 2021-01-21 LAB — BASIC METABOLIC PANEL
Anion gap: 11 (ref 5–15)
BUN: 91 mg/dL — ABNORMAL HIGH (ref 8–23)
CO2: 31 mmol/L (ref 22–32)
Calcium: 8.9 mg/dL (ref 8.9–10.3)
Chloride: 97 mmol/L — ABNORMAL LOW (ref 98–111)
Creatinine, Ser: 1.93 mg/dL — ABNORMAL HIGH (ref 0.44–1.00)
GFR, Estimated: 25 mL/min — ABNORMAL LOW (ref >60)
Glucose, Bld: 105 mg/dL — ABNORMAL HIGH (ref 70–99)
Potassium: 4.4 mmol/L (ref 3.5–5.1)
Sodium: 139 mmol/L (ref 135–145)

## 2021-01-21 LAB — GLUCOSE, CAPILLARY
Glucose-Capillary: 113 mg/dL — ABNORMAL HIGH (ref 70–99)
Glucose-Capillary: 119 mg/dL — ABNORMAL HIGH (ref 70–99)
Glucose-Capillary: 155 mg/dL — ABNORMAL HIGH (ref 70–99)
Glucose-Capillary: 189 mg/dL — ABNORMAL HIGH (ref 70–99)

## 2021-01-21 LAB — HEPARIN LEVEL (UNFRACTIONATED): Heparin Unfractionated: 0.27 IU/mL — ABNORMAL LOW (ref 0.30–0.70)

## 2021-01-21 LAB — PROTIME-INR
INR: 2.9 — ABNORMAL HIGH (ref 0.8–1.2)
Prothrombin Time: 30.4 seconds — ABNORMAL HIGH (ref 11.4–15.2)

## 2021-01-21 MED ORDER — IPRATROPIUM-ALBUTEROL 0.5-2.5 (3) MG/3ML IN SOLN
3.0000 mL | Freq: Two times a day (BID) | RESPIRATORY_TRACT | Status: DC
  Administered 2021-01-21 – 2021-01-28 (×14): 3 mL via RESPIRATORY_TRACT
  Filled 2021-01-21 (×15): qty 3

## 2021-01-21 MED ORDER — WARFARIN SODIUM 5 MG PO TABS
10.0000 mg | ORAL_TABLET | Freq: Once | ORAL | Status: AC
  Administered 2021-01-21: 10 mg via ORAL
  Filled 2021-01-21: qty 2

## 2021-01-21 NOTE — TOC Progression Note (Signed)
Transition of Care [facility]) - Progression Note    Patient Details  Name: [name] [name] MRN: [id number] Date of Birth: 11-24-1931  Transition of Care [facility]) CM/SW [place], [place] Phone Number: 01/21/2021, 1:16 PM  Clinical Narrative:   Patient seen in follow up to PT note recommending SNF/24 hr supervision.  Ms Coplen states she lives with her nephew who is there for part of the day when not working 12 hour shifts, and has an aide 4 hours a day.  She adamantly states she will not pay for more hours, nor will she go to short term rehab. She gave permission to talk with family.  I left message for Mr Kellie Simmering, spoke with Ms Orene Desanctis. She confirms that Ms Reitter makes her own decisions, that all family can do is support her as best they can, no one is available to help out more hours because everyone works, and she is welcoming of any [facility] services we can set up for her, including PT, OT and aide. TOC will continue to follow during the course of hospitalization.    Expected Discharge Plan: [place] Barriers to Discharge: Barriers Resolved  Expected Discharge Plan and Services Expected Discharge Plan: [place]   Discharge Planning Services: CM Consult Post Acute Care Choice: [place] arrangements for the past 2 months: Single Family Home                                       Social Determinants of Health (SDOH) Interventions    Readmission Risk Interventions Readmission Risk Prevention Plan 07/08/2020 04/01/2020 04/01/2020  Transportation Screening Complete Complete Complete  PCP or Specialist Appt within 3-5 Days - - -  HRI or [place] Work Consult for [place] - - -  Medication Review Press photographer) Complete Complete -  PCP or Specialist appointment within 3-5 days of discharge Complete - -  HRI or [place] Complete Complete  Complete  SW Recovery Care/Counseling Consult Complete Complete -  Palliative Care Screening Not Applicable Not Applicable Not Applicable  Skilled Nursing Facility Complete Not Applicable Not Applicable  Some recent data might be hidden

## 2021-01-21 NOTE — Progress Notes (Signed)
  Echocardiogram 2D Echocardiogram has been performed.  Jennette Dubin 01/21/2021, 8:46 AM

## 2021-01-21 NOTE — Care Management Important Message (Signed)
Important Message  Patient Details IM Letter given to the Patient. Name: [name] [name] MRN: [id number] Date of Birth: 04-Jun-1932   Medicare Important Message Given:  Yes     Kerin Salen 01/21/2021, 10:38 AM

## 2021-01-21 NOTE — Progress Notes (Signed)
ANTICOAGULATION CONSULT NOTE - Follow Up Consult  Pharmacy Consult for Heparin, Warfarin Indication: aortic and mitral mechanical valve replacement  Allergies  Allergen Reactions  . Penicillins Other (See Comments)    Bruise    . Aspirin Other (See Comments)    On coumadin  . Fentanyl Dermatitis    Rash with patch   . Niacin Itching   Patient Measurements: Height: 5\' 7"  (170.2 cm) Weight: 91.5 kg (201 lb 11.5 oz) IBW/kg (Calculated) : 61.6 Heparin Dosing Weight: 81 kg  Vital Signs: Temp: 97.9 F (36.6 C) (04/28 2202) Temp Source: Oral (04/28 2202) BP: 148/103 (04/28 2202) Pulse Rate: 60 (04/28 2202)  Labs: Recent Labs    01/18/21 1656 01/18/21 1930 01/18/21 1945 01/19/21 0718 01/19/21 1715 01/19/21 1821 01/20/21 0238 01/21/21 0513  HGB 10.7*  --    < > 10.2*  --  9.9* 8.5* 8.5*  HCT 36.4  --    < > 34.6*  --  33.5* 27.8* 27.9*  PLT 193  --   --  148*  --  169 159 157  LABPROT 19.0*  --   --  20.8*  --   --  25.9* 30.4*  INR 1.6*  --   --  1.8*  --   --  2.4* 2.9*  HEPARINUNFRC  --   --    < > 0.80* 0.65  --  0.30 0.27*  CREATININE  --  2.44*   < > 2.73*  --   --  2.27* 1.93*  TROPONINIHS 28* 37*  --   --   --   --   --   --    < > = values in this interval not displayed.   Estimated Creatinine Clearance: 24.3 mL/min (A) (by C-G formula based on SCr of 1.93 mg/dL (H)).  Medications:  Scheduled:  . azithromycin  500 mg Oral Daily  . budesonide (PULMICORT) nebulizer solution  0.5 mg Nebulization BID  . chlorhexidine  15 mL Mouth Rinse BID  . Chlorhexidine Gluconate Cloth  6 each Topical Daily  . furosemide  40 mg Intravenous Q12H  . gabapentin  300 mg Oral BID  . hydrALAZINE  10 mg Oral BID  . ipratropium-albuterol  3 mL Nebulization TID  . levETIRAcetam  500 mg Oral BID  . levothyroxine  88 mcg Oral QAC breakfast  . mouth rinse  15 mL Mouth Rinse q12n4p  . metoprolol tartrate  50 mg Oral BID  . predniSONE  40 mg Oral Q breakfast  . Warfarin -  Pharmacist Dosing Inpatient   Does not apply q1600   Infusions:  . heparin 1,100 Units/hr (01/20/21 2127)   Assessment: 8 yoF admitted on 4/26 with acute respiratory failure.  PMH significant for CHF, COPD, complete heart block s/p defib pacemaker, CKD-IV, mechanical aortic valve replacement, mitral valve replacement on chronic warfarin anticoagulation.  INR on admission is 1.6.  Pharmacy consulted to begin IV heparin when INR < 2.5 due to NPO status.  Home warfarin 10mg  daily.  LD on 4/25.  01/21/2021   Heparin level 0.27, IV infiltration occurred overnight, level slightly low  INR in therapeutic range 2.9  RN noted no bleed, patient eating well  Hgb = 8.5, PLTC 157   Goal of Therapy:   INR 2.5 - 3.5 Heparin level 0.3-0.7 units/ml Monitor platelets by anticoagulation protocol: Yes   Plan:  Discontinue Heparin Warfarin 10mg  today at 1600 Daily Protime/INR  Minda Ditto PharmD 01/21/2021, 7:04 AM

## 2021-01-21 NOTE — Consult Note (Signed)
Cardiology Consultation:   Patient ID: [name] [name] MRN: [id number]; DOB: 20-Dec-1931  Admit date: 01/18/2021 Date of Consult: 01/21/2021  PCP:  [name] Helper, MD   [place]  Cardiologist:  [name] Klein, MD  Advanced Practice Provider:  No care team member to display Electrophysiologist:  [name] Peru, MD    Patient Profile:   [name] [name] is a 87 y.o. 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 Tyrell Antonio.  History of Present Illness:   [name] is a 87 year old female with past medical history of persistent atrial fibrillation, CHB s/p Medtronic BiV pacemaker 01/30/2019, COPD on 2L/min home O2, HLD, DM2, hypothyroidism, chronic combined systolic and diastolic heart failure, and aortic and mitral valve replacement with mechanical valve.  Patient had mechanical AVR in 2001 and a mechanical MVR in 2004 performed in [place].  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  Prior to the first valve replacement surgery, she reportedly had a cardiac catheterization that does not show any coronary artery disease.  Over the years, her ejection fraction has been as low as 25% however improved to 55% by June 2020.  Patient was readmitted to the hospital in May 2021 with heart failure and atrial fibrillation after presenting with worsening dyspnea.  Repeat echocardiogram to obtained during this admission showed EF down to 30 to 35% with global hypokinesis and elevated PASP of 71.2 mmHg.  This echocardiogram was personally reviewed by Dr. Sallyanne [name] who felt that her ejection  fraction is more likely around 50% rather than 30 to 35%.   She was last seen by Dr. Sallyanne [name] on 10/27/2020 at which time was mentioned her nephrologist increased her Lasix to 80 mg twice a day due to leg edema, this led to improvement.  By the time she was followed up, she was on 80 mg daily of Lasix and metolazone 2.5 mg daily.  Talking with the patient, she does not remember taking the metolazone recently, nor was metolazone documented on the recent med list as well.  Her Lasix however was recorded as 80 mg twice a day.  She says her breathing varies from day-to-day.  She denies any recent chest pain, fever, chills or cough.  She was brought to [facility] on 01/18/2021 due to worsening shortness of breath for 2 days.  Per report, her Lasix was recently decreased.  Initial ABG shows pH of 7.22 and PCO2 of 80.  Chest x-ray shows cardiomegaly with mild central vascular congestion, no focal consolidation.  Initial creatinine was elevated 2.84, whereas her baseline creatinine has always been around 1.4-1.6.  BNP 424.  Hemoglobin 10.5.  INR subtherapeutic at 1.6.  COVID-19 test negative.  Her shortness of breath was thought to be due to combination of CHF exacerbation and COPD exacerbation.  Cardiology service was consulted after her echocardiogram came back on 01/21/2021 revealing LVEF is now 25%.   Past Medical History:  Diagnosis Date  . Acute on chronic diastolic CHF (congestive heart failure) ([place]) 05/19/2012  . Acute on chronic respiratory failure with hypoxia ([place]) 01/31/2020  . Adenomatous polyp of colon 12/16/2002   Dr. [name][facility]  . Allergy   . Calculus of gallbladder with chronic cholecystitis without obstruction 02/09/2009   Qualifier: Diagnosis of  By: [name], [name]    . Cataract   . CHB (complete heart block) ([place]) 10/07/2014      . CHF (congestive heart failure) ([place])    a.  EF previously reduced to 25-30% b. EF improved to 60-65% by echo  in 10/2017.   Marland Kitchen Chronic gout due to renal impairment    Per [facility] Electronic [facility] Records [facility]   . Chronic kidney disease, stage I 2006  . Cognitive communication deficit    Per [facility]   . Constipation       . Convulsion ([place])    Per [facility]   . COPD (chronic obstructive pulmonary disease) ([place])       . DJD (degenerative joint disease) of lumbar spine   . DM (diabetes mellitus) ([place]) 2006  . Dysphagia, unspecified    Per [facility] Records [facility]   . Esophageal reflux   . Glaucoma   . Gout       . H/O adenomatous polyp of colon 05/24/2012   2004 in [place] colonoscopy 2008 normal    . H/O heart valve replacement with mechanical valve    a. s/p mechanical AVR in 2001 and mechanical MVR in 2004.  Marland Kitchen H/O wisdom tooth extraction   . HIP PAIN, RIGHT 04/23/2009   Qualifier: Diagnosis of  By: [name], [name]    . Hyperkalemia    Per [facility] Records [facility]   . Hyperlipemia   . Hypertensive heart and kidney disease with HF and with CKD stage I-IV ([place])    Per [facility]   . IDA (iron deficiency anemia)    Parenteral iron/[name]  . Insomnia       . Localized edema    Left arm, Per [facility] Records [facility]   . Long term (current) use of anticoagulants    Per [facility] Records [facility]   . Nausea without vomiting 06/07/2010   Qualifier: Diagnosis of  By: [name], [name]    . Non-ischemic cardiomyopathy ([place])    a. s/p MDT CRTD  . Obesity, unspecified   . Oxygen deficiency 2014   nocturnal;  . Papilloma of breast 03/06/2012   This was diagnosed as a left breast papilloma by ultrasound characteristics and symptoms in  2010. Because of possible medical risk factors no surgical intervention has been done and it did doing annual followups. The area has been stable by physical examination and mammograms and ultrasound since 2010.   . Presence of cardiac pacemaker    Per [facility] Records [facility]   . Spinal stenosis of lumbar region 02/19/2013  . Spondylolisthesis   . Spondylosis   . Thyroid cancer ([place])    remote thyroidectomy, no recurrence, pt denies in 2016 thyroid cancer  . Type 2 diabetes mellitus with hyperglycemia ([place]) 07/15/2010  . Type 2 diabetes mellitus with hypoglycemia without coma ([place])    Per [facility]   . Unspecified chronic bronchitis ([place])    Per [facility]   . Unspecified hypothyroidism       .  Urinary tract infection, site not specified    Per [facility]   . 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 Vertigo         Past Surgical History:  Procedure Laterality Date  . AORTIC AND MITRAL VALVE REPLACEMENT  2001  . BI-VENTRICULAR IMPLANTABLE CARDIOVERTER DEFIBRILLATOR UPGRADE N/A 01/18/2015   Procedure: BI-VENTRICULAR IMPLANTABLE CARDIOVERTER DEFIBRILLATOR UPGRADE;  Surgeon: Evans Lance, MD;  Location: [facility] CATH LAB;  Service: Cardiovascular;  Laterality: N/A;  . BIV ICD GENERATOR CHANGEOUT N/A 01/30/2019   Procedure: BIV ICD GENERATOR CHANGEOUT;  Surgeon: Evans Lance, MD;  Location: [facility] CV LAB;  Service: Cardiovascular;  Laterality: N/A;  . CARDIAC CATHETERIZATION    . CARDIAC VALVE REPLACEMENT    . CARDIOVERSION N/A 11/13/2016   Procedure: CARDIOVERSION;  Surgeon: [name] Klein, MD;  Location: [facility] ENDOSCOPY;  Service: Cardiovascular;  Laterality: N/A;  . COLONOSCOPY  06/12/2007   Rourk- Normal rectum/Normal colon  . COLONOSCOPY  12/16/02   small  hemorrhoids  . COLONOSCOPY  06/20/2012   Procedure: COLONOSCOPY;  Surgeon: Daneil Dolin, MD;  Location: [facility] ENDO SUITE;  Service: Endoscopy;  Laterality: N/A;  10:45  . defibrillator implanted 2006    . DOPPLER ECHOCARDIOGRAPHY  2012  . DOPPLER ECHOCARDIOGRAPHY  05,06,07,08,09,2011  . ESOPHAGOGASTRODUODENOSCOPY  06/12/2007   Rourk- normal esophagus, small hiatal hernia, otherwise normal stomach, D1, D2  . EYE SURGERY Right 2005  . EYE SURGERY Left 2006  . ICD LEAD REMOVAL Left 02/08/2015   Procedure: ICD LEAD REMOVAL;  Surgeon: Evans Lance, MD;  Location: [facility] OR;  Service: Cardiovascular;  Laterality: Left;  "Will plan extraction and insertion of a BiV PM"  **Dr. Roxan Hockey backing up case**  . IMPLANTABLE CARDIOVERTER DEFIBRILLATOR (ICD) GENERATOR CHANGE Left 02/08/2015   Procedure: ICD GENERATOR CHANGE;  Surgeon: Evans Lance, MD;  Location: [place];  Service: Cardiovascular;  Laterality: Left;  . INSERT / REPLACE / REMOVE PACEMAKER    . PACEMAKER INSERTION  May 2016  . pacemaker placed  2004  . right breast cyst removed     benign   . right cataract removed     2005  . TEE WITHOUT CARDIOVERSION N/A 11/13/2016   Procedure: TRANSESOPHAGEAL ECHOCARDIOGRAM (TEE);  Surgeon: [name] Klein, MD;  Location: [facility] ENDOSCOPY;  Service: Cardiovascular;  Laterality: N/A;  . THYROIDECTOMY       Home Medications:  Prior to Admission medications   Medication Sig Start Date End Date Taking? Authorizing Provider  acetaminophen (TYLENOL) 500 MG tablet Take 1 tablet (500 mg total) by mouth every 6 (six) hours as needed (pain). Patient taking differently: Take 500 mg by mouth every 6 (six) hours as needed for moderate pain (pain). 07/08/20  Yes Oretha Milch D, MD  albuterol (PROVENTIL) (2.5 MG/3ML) 0.083% nebulizer solution Take 3 mLs (2.5 mg total) by nebulization in the morning and at bedtime. *May use additional one time if needed for shortness of breath Patient taking differently: Take 2.5 mg by  nebulization in the morning and at bedtime. 01/10/21  Yes [name] Helper, MD  albuterol (VENTOLIN HFA) 108 (90 Base) MCG/ACT inhaler Inhale 1 puff into the lungs every 6 (six) hours as needed for wheezing or shortness of breath. 01/10/21  Yes [name] Helper, MD  allopurinol (ZYLOPRIM) 100 MG tablet TAKE ONE TABLET BY MOUTH ONCE DAILY. Patient taking differently: Take 100 mg by mouth daily. 01/04/21  Yes [name] Helper, MD  BREO ELLIPTA 100-25 MCG/INH AEPB INHALE (1) PUFF BY MOUTH INTO THE LUNGS DAILY Patient taking differently: Inhale 1 puff into the lungs daily. 01/04/21  Yes [name] Helper, MD  cholecalciferol (VITAMIN D3) 25 MCG (1000 UNIT) tablet Take 1 tablet (1,000 Units total) by mouth in the morning. 07/08/20  Yes Oretha Milch D, MD  fluticasone (FLONASE) 50 MCG/ACT nasal spray Place 2 sprays into both nostrils daily. Patient taking differently: Place 2 sprays into both nostrils daily as needed for allergies. 07/08/20  Yes Desiree Hane, MD  folic acid (FOLVITE) 1 MG tablet TAKE ONE TABLET BY MOUTH ONCE DAILY. Patient taking differently: Take 1 mg by mouth daily. 01/04/21  Yes [name] Helper, MD  furosemide (LASIX) 40 MG tablet Take 40 mg daily, except take 40 mg TWICE A DAY on Saturday and Sunday Patient taking differently: Take 80 mg by mouth 2 (two) times daily. 01/11/21  Yes Evans Lance, MD  gabapentin (NEURONTIN) 300 MG capsule TAKE (1) CAPSULE BY MOUTH TWICE DAILY Patient taking differently: Take 300 mg by mouth 2 (two) times daily. 01/04/21  Yes [name] Helper, MD  levETIRAcetam (KEPPRA) 500 MG tablet TAKE (1) TABLET BY MOUTH TWICE DAILY AT 9AM AND 9PM Patient taking differently: Take 500 mg by mouth 2 (two) times daily. 01/04/21  Yes [name] Helper, MD  [facility] PAIN RELIEF PATCH EX) Apply 1 patch topically daily as needed (pain).   Yes [provider]  metoprolol tartrate (LOPRESSOR) 50 MG tablet TAKE (1) TABLET BY MOUTH TWICE  DAILY AT 9AM AND 9PM. Patient taking differently: Take 50 mg by mouth 2 (two) times daily. 01/04/21  Yes [name] Helper, MD  nitroGLYCERIN (NITROSTAT) 0.4 MG SL tablet DISSOLVE 1 TABLET SUBLINGUALLY AS NEEDED FOR CHEST PAIN, MAY REPEAT EVERY 5 MINUTES. AFTER 3 CALL 911. Patient taking differently: Place 0.4 mg under the tongue every 5 (five) minutes as needed for chest pain. 09/08/20  Yes [name] Helper, MD  ondansetron (ZOFRAN-ODT) 4 MG disintegrating tablet TAKE (1) TABLET BY MOUTH EVERY EIGHT HOURS AS NEEDED FOR NAUSEA OR VOMITING Patient taking differently: Take 4 mg by mouth every 8 (eight) hours as needed for nausea or vomiting. 01/14/21  Yes [name] Helper, MD  OXYGEN Inhale 3 L into the lungs continuous. 07/08/20  Yes [provider]  polyethylene glycol (MIRALAX / GLYCOLAX) 17 g packet Take 17 g by mouth daily as needed. Patient taking differently: Take 17 g by mouth daily as needed for mild constipation. 07/08/20  Yes Oretha Milch D, MD  potassium chloride SA (KLOR-CON) 20 MEQ tablet TAKE ONE TABLET BY MOUTH ONCE DAILY. Patient taking differently: Take 20 mEq by mouth daily. 01/04/21  Yes Noreene Larsson, NP  pravastatin (PRAVACHOL) 40 MG tablet TAKE ONE TABLET BY MOUTH ONCE DAILY. Patient taking differently: Take 40 mg by mouth daily. 01/04/21  Yes [name] Helper, MD  SYNTHROID 88 MCG tablet TAKE 1 TABLET BEFORE BREAKFAST. Patient taking differently: Take 88 mcg by mouth daily before breakfast. 01/04/21  Yes [name] Helper, MD  warfarin (COUMADIN) 10 MG tablet TAKE (1) TABLET BY MOUTH DAILY AT 4PM Patient taking differently: Take 10 mg by mouth daily at 6 (six) AM. 12/29/20  Yes [name] Helper, MD  benzonatate (TESSALON) 100 MG capsule Take 1 capsule (100 mg total) by mouth 2 (two) times daily as needed for cough. Patient not taking: No sig reported 09/22/20   Noreene Larsson, NP  HYDROcodone-homatropine [facility]) 5-1.5  MG/5ML syrup Take 5 mLs by mouth  every 8 (eight) hours as needed for cough. Patient not taking: No sig reported 09/28/20   Jacquelin Hawking, NP  NON FORMULARY Diet Change: Soft diet with dys 2 (ground) meats, thin liquids (NAS, cons CHO, heart healthy) 07/26/20   [provider]  senna (SENOKOT) 8.6 MG TABS tablet Take 2 tablets (17.2 mg total) by mouth daily as needed for mild constipation. Patient not taking: No sig reported 07/08/20   Desiree Hane, MD  UNABLE TO FIND Compression Stockings R-  Ankle  26.5cm   Calf 38cm  Leg  44cm L-  Ankle  27.5 cm   Calf 39cm  Leg  44cm Patient taking differently: Compression Stockings R-  Ankle  26.5cm   Calf 38cm  Leg  44cm L-  Ankle  27.5 cm   Calf 39cm  Leg  44cm 09/15/20   [name] Helper, MD  UNABLE TO FIND 15-19 mmhg compression hose, knee high Patient not taking: Reported on 01/18/2021 11/04/20   [name] Helper, MD    Inpatient Medications: Scheduled Meds: . azithromycin  500 mg Oral Daily  . budesonide (PULMICORT) nebulizer solution  0.5 mg Nebulization BID  . chlorhexidine  15 mL Mouth Rinse BID  . Chlorhexidine Gluconate Cloth  6 each Topical Daily  . furosemide  40 mg Intravenous Q12H  . gabapentin  300 mg Oral BID  . hydrALAZINE  10 mg Oral BID  . ipratropium-albuterol  3 mL Nebulization BID  . levETIRAcetam  500 mg Oral BID  . levothyroxine  88 mcg Oral QAC breakfast  . mouth rinse  15 mL Mouth Rinse q12n4p  . metoprolol tartrate  50 mg Oral BID  . predniSONE  40 mg Oral Q breakfast  . warfarin  10 mg Oral ONCE-1600  . Warfarin - Pharmacist Dosing Inpatient   Does not apply q1600   Continuous Infusions:  PRN Meds: acetaminophen **OR** acetaminophen, hydrALAZINE, ondansetron  Allergies:    Allergies  Allergen Reactions  . Penicillins Other (See Comments)    Bruise    . Aspirin Other (See Comments)    On coumadin  . Fentanyl Dermatitis    Rash with patch   . Niacin Itching    Social History:   Social History   Socioeconomic  History  . Marital status: Widowed    Spouse name: Not on file  . Number of children: 0  . Years of education: Not on file  . Highest education level: Not on file  Occupational History  . Occupation: retired    Fish farm manager: RETIRED  Tobacco Use  . Smoking status: Former Smoker    Packs/day: 0.75    Years: 40.00    Pack years: 30.00    Types: Cigarettes    Quit date: 03/08/1987    Years since quitting: 33.8  . Smokeless tobacco: Never Used  Vaping Use  . Vaping Use: Never used  Substance and Sexual Activity  . Alcohol use: No    Alcohol/week: 0.0 standard drinks  . Drug use: No  . Sexual activity: Not Currently  Other Topics Concern  . Not on file  Social History Narrative   From [place] (2004)   Social Determinants of Health   Financial Resource Strain: Low Risk   . Difficulty of Paying Living Expenses: Not hard at all  Food Insecurity: No Food Insecurity  . Worried About Charity fundraiser in the Last Year: Never true  . Ran Out of Food  in the Last Year: Never true  Transportation Needs: No Transportation Needs  . Lack of Transportation (Medical): No  . Lack of Transportation (Non-Medical): No  Physical Activity: Inactive  . Days of Exercise per Week: 0 days  . Minutes of Exercise per Session: 0 min  Stress: No Stress Concern Present  . Feeling of Stress : Only a little  Social Connections: Moderately Isolated  . Frequency of Communication with Friends and Family: More than three times a week  . Frequency of Social Gatherings with Friends and Family: Twice a week  . Attends Religious Services: More than 4 times per year  . Active Member of Clubs or Organizations: No  . Attends Archivist Meetings: Never  . Marital Status: Widowed  Intimate Partner Violence: Not on file    Family History:    Family History  Problem Relation Age of Onset  . Pancreatic cancer Mother   . Heart disease Father   . Heart disease Sister   . Heart attack Sister   . Heart  disease Brother   . Diabetes Brother   . Heart disease Brother   . Diabetes Brother   . Hypertension Brother   . Lung cancer Brother      ROS:  Please see the history of present illness.   All other ROS reviewed and negative.     Physical Exam/Data:   Vitals:   01/20/21 2202 01/21/21 0500 01/21/21 0736 01/21/21 1111  BP: (!) 148/103   (!) 144/61  Pulse: 60   (!) 58  Resp: 20     Temp: 97.9 F (36.6 C)     TempSrc: Oral     SpO2: 97%  97%   Weight:  91.5 kg    Height:        Intake/Output Summary (Last 24 hours) at 01/21/2021 1313 Last data filed at 01/21/2021 1249 Gross per 24 hour  Intake 902.42 ml  Output 2500 ml  Net -1597.58 ml   Last 3 Weights 01/21/2021 01/19/2021 01/18/2021  Weight (lbs) 201 lb 11.5 oz 192 lb 7.4 oz 200 lb  Weight (kg) 91.5 kg 87.3 kg 90.719 kg     Body mass index is 31.59 kg/m.  General:  Well nourished, well developed, in no acute distress HEENT: normal Lymph: no adenopathy Neck: no JVD Endocrine:  No thryomegaly Vascular: No carotid bruits; FA pulses 2+ bilaterally without bruits  Cardiac:  normal S1, S2; RRR; no murmur  Lungs:  Diffusely decreased breath sound with no focal wheezing, rale, or rhonchi  Abd: soft, nontender, no hepatomegaly  Ext: no edema Musculoskeletal:  No deformities, BUE and BLE strength normal and equal Skin: warm and dry  Neuro:  CNs 2-12 intact, no focal abnormalities noted Psych:  Normal affect   EKG:  The EKG was personally reviewed and demonstrates:  ventricularly paced rhythm Telemetry:  Telemetry was personally reviewed and demonstrates:  V paced rhythm  Relevant CV Studies:  Echo 01/21/2021 1. Left ventricular ejection fraction, by estimation, is 25 to 30%. The  left ventricle has severely decreased function. The left ventricle  demonstrates global hypokinesis. The left ventricular internal cavity size  was moderately dilated. There is mild  concentric left ventricular hypertrophy. Left ventricular  diastolic  parameters are indeterminate.  2. Right ventricular systolic function is normal. The right ventricular  size is normal. There is severely elevated pulmonary artery systolic  pressure.  3. Left atrial size was severely dilated.  4. Right atrial size was severely dilated.  5. A small pericardial effusion is present. The pericardial effusion is  posterior to the left ventricle.  6. Mitral valve gradient essentially unchanged form 01/2020. The mitral  valve has been repaired/replaced. No evidence of mitral valve  regurgitation. No evidence of mitral stenosis. The mean mitral valve  gradient is 6.0 mmHg. There is a mechanical valve  present in the mitral position. Procedure Date: 2004.  7. Tricuspid valve regurgitation is mild to moderate.  8. The aortic valve has been repaired/replaced. Aortic valve  regurgitation is not visualized. No aortic stenosis is present. There is a  mechanical valve present in the aortic position. Procedure Date: 2001.  Echo findings are consistent with normal  structure and function of the aortic valve prosthesis. Aortic valve area,  by VTI measures 1.32 cm. Aortic valve mean gradient measures 7.0 mmHg.  Aortic valve Vmax measures 2.00 m/s.  9. Aortic dilatation noted. There is mild dilatation of the ascending  aorta, measuring 38 mm.  10. The inferior vena cava is dilated in size with <50% respiratory  variability, suggesting right atrial pressure of 15 mmHg.   Laboratory Data:  High Sensitivity Troponin:   Recent Labs  Lab 01-29-21 1656 Jan 29, 2021 1930  TROPONINIHS 28* 37*     Chemistry Recent Labs  Lab 01/19/21 0718 01/20/21 0238 01/21/21 0513  NA 145 141 139  K 5.5* 4.5 4.4  CL 102 102 97*  CO2 34* 30 31  GLUCOSE 147* 119* 105*  BUN 96* 93* 91*  CREATININE 2.73* 2.27* 1.93*  CALCIUM 9.1 8.9 8.9  GFRNONAA 16* 21* 25*  ANIONGAP 9 9 11     Recent Labs  Lab 2021/01/29 1142 29-Jan-2021 1930 01/19/21 0718  PROT  --  5.7* 7.7   ALBUMIN 3.9 2.8* 3.7  AST  --  32 41  ALT  --  20 25  ALKPHOS  --  38 46  BILITOT  --  0.4 0.4   Hematology Recent Labs  Lab 01/19/21 1821 01/20/21 0238 01/21/21 0513  WBC 4.1 6.0 7.5  RBC 3.64* 3.09* 3.11*  HGB 9.9* 8.5* 8.5*  HCT 33.5* 27.8* 27.9*  MCV 92.0 90.0 89.7  MCH 27.2 27.5 27.3  MCHC 29.6* 30.6 30.5  RDW 18.9* 18.9* 19.2*  PLT 169 159 157   BNP Recent Labs  Lab 01-29-2021 1656  BNP 424.4*    DDimer No results for input(s): DDIMER in the last 168 hours.   Radiology/Studies:  US Renal  Result Date: Jan 29, 2021 CLINICAL DATA:  Renal failure. EXAM: RENAL / URINARY TRACT ULTRASOUND COMPLETE COMPARISON:  Abdominal CT 06/26/2016. FINDINGS: Right Kidney: Renal measurements: 9.9 x 4.1 x 4.5 cm = volume: 95 mL. Mild hydronephrosis. Renal parenchymal thinning with increased parenchymal echogenicity. 1.1 cm cyst in the mid kidney. Left Kidney: Very limited evaluation due to shadowing bowel gas. Renal measurements: 10.4 x 5.1 x 4.9 cm = volume: 136 mL. No gross hydronephrosis. More detailed assessment is limited. Cyst on prior CT is not seen. Bladder: Appears normal for degree of bladder distention. Neither ureteral jet is seen. Other: None. IMPRESSION: 1. Mild right hydronephrosis. 2. Right renal parenchymal thinning and increased echogenicity consistent with chronic medical renal disease. 3. Significantly limited evaluation of the left kidney, no gross hydronephrosis. Electronically Signed   By: Keith Rake M.D.   On: 2021-01-29 18:26   DG Chest Port 1 View  Result Date: 01/20/2021 CLINICAL DATA:  Pulmonary edema. EXAM: PORTABLE CHEST 1 VIEW COMPARISON:  Chest x-ray 01-29-2021. FINDINGS: AICD in stable position. Prior  cardiac valve replacement. Stable prominent cardiomegaly. Mild bilateral interstitial prominence. Mild interstitial edema and/or pneumonitis cannot be excluded. Progressive bibasilar atelectasis. Small left pleural effusion cannot be excluded. No pneumothorax.  IMPRESSION: 1. AICD in stable position. Prior cardiac valve replacement. Stable prominent cardiomegaly. Mild bilateral interstitial prominence. Mild interstitial edema and/or pneumonitis cannot be excluded. 2. Progressive bibasilar atelectasis. Small left pleural effusion cannot be excluded. Electronically Signed   By: Marcello Moores  Register   On: 01/20/2021 05:44   DG Chest Port 1 View  Result Date: 01/18/2021 CLINICAL DATA:  87 year old female with shortness of breath. EXAM: PORTABLE CHEST 1 VIEW COMPARISON:  Chest radiograph dated 12/31/2020. FINDINGS: There is cardiomegaly with mild central vascular congestion. No focal consolidation, pleural effusion, or pneumothorax. Left pectoral AICD device. Atherosclerotic calcification of the aorta. Median sternotomy wires. No acute osseous pathology. IMPRESSION: Cardiomegaly with mild central vascular congestion. No focal consolidation. Electronically Signed   By: Anner Crete M.D.   On: 01/18/2021 18:32   ECHOCARDIOGRAM COMPLETE  Result Date: 01/21/2021    ECHOCARDIOGRAM REPORT   Patient Name:   Mayrene Gassman Date of Exam: 01/21/2021 Medical Rec #:  [id number]      Height:       67.0 in Accession #:    [id number]     Weight:       201.7 lb Date of Birth:  14-Sep-1932      BSA:          2.030 m Patient Age:    41 years       BP:           148/103 mmHg Patient Gender: F              HR:           65 bpm. Exam Location:  Inpatient Procedure: 2D Echo Indications:    Congestive Heart Failure I50.9  History:        Patient has prior history of Echocardiogram examinations, most                 recent 01/31/2020. Defibrillator; Risk Factors:Dyslipidemia and                 Diabetes.                 Aortic Valve: mechanical valve is present in the aortic                 position. Procedure Date: 2001.                 Mitral Valve: mechanical valve valve is present in the mitral                 position. Procedure Date: 2004.  Sonographer:    Mikki Santee RDCS (AE)  Referring Phys: [id number] BELKYS A REGALADO IMPRESSIONS  1. Left ventricular ejection fraction, by estimation, is 25 to 30%. The left ventricle has severely decreased function. The left ventricle demonstrates global hypokinesis. The left ventricular internal cavity size was moderately dilated. There is mild concentric left ventricular hypertrophy. Left ventricular diastolic parameters are indeterminate.  2. Right ventricular systolic function is normal. The right ventricular size is normal. There is severely elevated pulmonary artery systolic pressure.  3. Left atrial size was severely dilated.  4. Right atrial size was severely dilated.  5. A small pericardial effusion is present. The pericardial effusion is posterior to the left ventricle.  6. Mitral valve gradient essentially unchanged form 01/2020. The mitral valve  has been repaired/replaced. No evidence of mitral valve regurgitation. No evidence of mitral stenosis. The mean mitral valve gradient is 6.0 mmHg. There is a mechanical valve present in the mitral position. Procedure Date: 2004.  7. Tricuspid valve regurgitation is mild to moderate.  8. The aortic valve has been repaired/replaced. Aortic valve regurgitation is not visualized. No aortic stenosis is present. There is a mechanical valve present in the aortic position. Procedure Date: 2001. Echo findings are consistent with normal structure and function of the aortic valve prosthesis. Aortic valve area, by VTI measures 1.32 cm. Aortic valve mean gradient measures 7.0 mmHg. Aortic valve Vmax measures 2.00 m/s.  9. Aortic dilatation noted. There is mild dilatation of the ascending aorta, measuring 38 mm. 10. The inferior vena cava is dilated in size with <50% respiratory variability, suggesting right atrial pressure of 15 mmHg. FINDINGS  Left Ventricle: Left ventricular ejection fraction, by estimation, is 25 to 30%. The left ventricle has severely decreased function. The left ventricle demonstrates global  hypokinesis. The left ventricular internal cavity size was moderately dilated. There is mild concentric left ventricular hypertrophy. Left ventricular diastolic parameters are indeterminate. Right Ventricle: The right ventricular size is normal. No increase in right ventricular wall thickness. Right ventricular systolic function is normal. There is severely elevated pulmonary artery systolic pressure. The tricuspid regurgitant velocity is 3.89 m/s, and with an assumed right atrial pressure of 15 mmHg, the estimated right ventricular systolic pressure is 27.7 mmHg. Left Atrium: Left atrial size was severely dilated. Right Atrium: Right atrial size was severely dilated. Pericardium: A small pericardial effusion is present. The pericardial effusion is posterior to the left ventricle. Mitral Valve: Mitral valve gradient essentially unchanged form 01/2020. The mitral valve has been repaired/replaced. No evidence of mitral valve regurgitation. There is a mechanical valve present in the mitral position. Procedure Date: 2004. No evidence of mitral valve stenosis. MV peak gradient, 15.8 mmHg. The mean mitral valve gradient is 6.0 mmHg with average heart rate of 59 bpm. Tricuspid Valve: The tricuspid valve is normal in structure. Tricuspid valve regurgitation is mild to moderate. No evidence of tricuspid stenosis. Aortic Valve: The aortic valve has been repaired/replaced. Aortic valve regurgitation is not visualized. No aortic stenosis is present. Aortic valve mean gradient measures 7.0 mmHg. Aortic valve peak gradient measures 16.0 mmHg. Aortic valve area, by VTI  measures 1.32 cm. There is a mechanical valve present in the aortic position. Procedure Date: 2001. Echo findings are consistent with normal structure and function of the aortic valve prosthesis. Pulmonic Valve: The pulmonic valve was normal in structure. Pulmonic valve regurgitation is mild. No evidence of pulmonic stenosis. Aorta: Aortic dilatation noted. There  is mild dilatation of the ascending aorta, measuring 38 mm. Venous: The inferior vena cava is dilated in size with less than 50% respiratory variability, suggesting right atrial pressure of 15 mmHg. IAS/Shunts: No atrial level shunt detected by color flow Doppler. Additional Comments: A venous catheter is visualized.  LEFT VENTRICLE PLAX 2D LVIDd:         6.35 cm  Diastology LVIDs:         5.49 cm  LV e' medial:    5.78 cm/s LV PW:         0.96 cm  LV E/e' medial:  33.2 LV IVS:        1.10 cm  LV e' lateral:   5.61 cm/s LVOT diam:     2.20 cm  LV E/e' lateral: 34.2 LV SV:  55 LV SV Index:   27 LVOT Area:     3.80 cm  RIGHT VENTRICLE TAPSE (M-mode): 1.2 cm LEFT ATRIUM            Index       RIGHT ATRIUM           Index LA diam:      4.70 cm  2.32 cm/m  RA Area:     33.90 cm LA Vol (A2C): 78.8 ml  38.82 ml/m RA Volume:   140.00 ml 68.98 ml/m LA Vol (A4C): 103.0 ml 50.75 ml/m  AORTIC VALVE AV Area (Vmax):    1.85 cm AV Area (Vmean):   1.71 cm AV Area (VTI):     1.32 cm AV Vmax:           200.00 cm/s AV Vmean:          124.000 cm/s AV VTI:            0.419 m AV Peak Grad:      16.0 mmHg AV Mean Grad:      7.0 mmHg LVOT Vmax:         97.50 cm/s LVOT Vmean:        55.900 cm/s LVOT VTI:          0.145 m LVOT/AV VTI ratio: 0.35  AORTA Ao Root diam: 3.10 cm MITRAL VALVE                TRICUSPID VALVE MV Area (PHT): 3.17 cm     TR Peak grad:   60.5 mmHg MV Area VTI:   1.17 cm     TR Vmax:        389.00 cm/s MV Peak grad:  15.8 mmHg MV Mean grad:  6.0 mmHg     SHUNTS MV Vmax:       1.99 m/s     Systemic VTI:  0.14 m MV Vmean:      118.0 cm/s   Systemic Diam: 2.20 cm MV Decel Time: 239 msec MV E velocity: 192.00 cm/s MV A velocity: 66.10 cm/s MV E/A ratio:  2.90 Skeet Latch MD Electronically signed by Skeet Latch MD Signature Date/Time: 01/21/2021/11:43:07 AM    Final      Assessment and Plan:   1. Acute on chronic combined systolic and diastolic heart failure  -Initial chest x-ray showed  vascular congestion.   -Although last cardiology note mentions she is on 80 mg of Lasix +2.5 mg daily of metolazone, however patient does not remember taking the metolazone recently.  Based on her med list, it appears she has been on 80 mg twice daily of Lasix with no metolazone.  -She has been placed on 40 mg twice daily of IV Lasix with good urine output. I/O -2.6 L.  Weight is not accurate.  -She says her breathing is approaching baseline.  On exam, she only has trace amount of lower extremity edema.  I also think she is near euvolemic level.  Will discuss with MD to see if they wish to do a device interrogation to look at [facility] level.  - MD to review Echo to see if EF is indeed down. She does not have prior h/o CAD, reportedly had a cath in [place] prior to AVR in 2001 that showed normal coronary arteries. She is not a good cath candidate given renal dysfunction, recommend medical therapy with plan to repeat echo later. May consolidate metoprolol tartrate to metoprolol succinate. No ACEI/ARB.    2.  Persistent atrial fibrillation: on 50mg  BID metoprolol tartrate. On Coumadin for mechanical valve  3. Complete heart block s/p [employer] BiV pacemaker 01/30/2019  4. COPD: No active wheezing or rhonchi on exam  5. Hyperlipidemia  6. DM2  7. Hypothyroidism  8. Mechanical aortic and mitral valve replacement: On Coumadin.  INR initially subtherapeutic, now within therapeutic level   Risk Assessment/Risk Scores:        New [place] Heart Association (NYHA) Functional Class NYHA Class III  CHA2DS2-VASc Score = 6  This indicates a 9.7% annual risk of stroke. The patient's score is based upon: CHF History: Yes HTN History: Yes Diabetes History: Yes Stroke History: No Vascular Disease History: No Age Score: 2 Gender Score: 1         For questions or updates, please contact [place] Please consult [email or web address] for contact info under    Hilbert Corrigan, [place]  01/21/2021 1:13  PM

## 2021-01-21 NOTE — Progress Notes (Signed)
Physical Therapy Treatment Patient Details Name: [name] [name] MRN: [id number] DOB: [birth year] Today's Date: 01/21/2021    History of Present Illness Patient is an 87 year old female  PMH significant for COPD, chronic combined systolic and diastolic heart failure, complete heart block, A. fib status post pacemaker and ICD history of mechanical aortic valve on Coumadin, chronic kidney disease a stage III, hypothyroidism, seizure who presents with shortness of breath.  Pt admitted for Acute hypoxic hypercapnic respiratory failure; in the setting of COPD exacerbation and heart failure exacerbation    PT Comments    Pt agreeable to mobilize and requested using bathroom.  Pt remained on 2L O2 [place] and SPO2 91% upon sitting in recliner.  Pt requires increased time for mobility. Pt plans to d/c home with home health services.  Follow Up Recommendations  SNF     Equipment Recommendations  None recommended by PT    Recommendations for Other Services       Precautions / Restrictions Precautions Precautions: Fall;ICD/Pacemaker Precaution Comments: monitor VS    Mobility  Bed Mobility Overal bed mobility: Needs Assistance Bed Mobility: Supine to Sit     Supine to sit: Min guard;HOB elevated     General bed mobility comments: increased time and effort    Transfers Overall transfer level: Needs assistance Equipment used: Rolling walker (2 wheeled) Transfers: Sit to/from Stand Sit to Stand: Min assist         General transfer comment: assist to rise and steady, cues for hand placement to assist, increased time required  Ambulation/Gait Ambulation/Gait assistance: Min guard Gait Distance (Feet): 16 Feet (total) Assistive device: Rolling walker (2 wheeled) Gait Pattern/deviations: Step-through pattern;Decreased stride length;Trunk flexed     General Gait Details: pt with increased forward trunk lean, ambulated to/from bathroom, SPO2 91% on 2L O2 [place] upon sitting in recliner; pt  unable to ambulate farther due to fatigue, reports no increased SOB   Stairs             Wheelchair Mobility    Modified Rankin (Stroke Patients Only)       Balance                                            Cognition Arousal/Alertness: Awake/alert Behavior During Therapy: WFL for tasks assessed/performed Overall Cognitive Status: Within Functional Limits for tasks assessed                                        Exercises      General Comments        Pertinent Vitals/Pain Pain Assessment: No/denies pain Pain Intervention(s): Monitored during session;Repositioned    Home Living                      Prior Function            PT Goals (current goals can now be found in the care plan section) Progress towards PT goals: Progressing toward goals    Frequency    Min 2X/week      PT Plan Current plan remains appropriate    Co-evaluation              AM-PAC PT "6 Clicks" Mobility   Outcome Measure  Help needed turning from your back  to your side while in a flat bed without using bedrails?: A Little Help needed moving from lying on your back to sitting on the side of a flat bed without using bedrails?: A Little Help needed moving to and from a bed to a chair (including a wheelchair)?: A Lot Help needed standing up from a chair using your arms (e.g., wheelchair or bedside chair)?: A Lot Help needed to walk in hospital room?: A Lot Help needed climbing 3-5 steps with a railing? : Total 6 Click Score: 13    End of Session Equipment Utilized During Treatment: Gait belt Activity Tolerance: Patient limited by fatigue Patient left: in chair;with call bell/phone within reach (chair alarm box not in room; pt agreeable to use call bell) Nurse Communication: Mobility status PT Visit Diagnosis: Unsteadiness on feet (R26.81);Difficulty in walking, not elsewhere classified (R26.2)     Time: 3220-2542 PT Time  Calculation (min) (ACUTE ONLY): 28 min  Charges:  $Gait Training: 8-22 mins $Therapeutic Activity: 8-22 mins                     Jannette Spanner PT, DPT Acute Rehabilitation Services Pager: [phone] Office: [phone]  [place] Ram E 01/21/2021, 3:41 PM

## 2021-01-21 NOTE — Progress Notes (Signed)
PROGRESS NOTE    [name] [name]  LFY:[id number] DOB: 22-Dec-1931 DOA: 01/18/2021 PCP: Fayrene Helper, MD   Brief Narrative: 87 year old with past medical history significant for COPD, chronic combined systolic and diastolic heart failure, complete heart block, A. fib status post pacemaker and ICD history of mechanical aortic valve on Coumadin, chronic kidney disease a stage III, hypothyroidism, seizure who presents with shortness of breath.  Patient received a dose of Retacrit prior to coming to the hospital.  Patient has been having shortness of breath for the last 2 days prior to admission.  Evaluation in the ED patient was found to be lethargic initial ABG showed pH of 7.2 a PCO2 of 80, chest x-ray which pulmonary congestion COVID test negative, patient was placed on BiPAP.  She was a started on IV Lasix nebulizer.  CCM was consulted. Patient improved on BIPAP, and treatment for Heart failure and COPD exacerbation.     Assessment & Plan:   Principal Problem:   Acute respiratory failure with hypoxia and hypercapnia (HCC) Active Problems:   Hx of aortic valve replacement, mechanical   S/P MVR (mitral valve replacement)   Chronic anticoagulation, on coumadin for Mechanical AVR and MVR   S/P ICD (internal cardiac defibrillator) procedure   Persistent atrial fibrillation (HCC)   COPD with acute exacerbation (HCC)   Type 2 diabetes mellitus with hypoglycemia without coma (HCC)   Acute systolic CHF (congestive heart failure) (HCC)   Stage 3b chronic kidney disease (HCC)   Seizure (HCC)   Acute respiratory failure with hypoxia (HCC)  1-Acute hypoxic hypercapnic respiratory failure; in the setting of COPD exacerbation and heart failure exacerbation: -Presented hypoxic hypercapnic lethargic she was placed on BiPAP. -Patient  improved on BiPAP. Off BIPAP.  -Continue with IV Lasix, nebulizer. Solumedrol change to prednisone for 4 days taper.  -Incentive spirometry.  -Improved  2-Acute  on chronic combined systolic and diastolic heart failure: Continue with Lasix IV twice daily.  Monitor output and renal function. Urine out put : 2.5 L Weight: 200---192--201 Chest x ray with 4/28 mild interstitial prominence. Mild interstitial edema. Bibasilar atelectasis.  -Continue with Diuretics -ECHO EF lower 25 % from 35 %  -Will consult cardiology/   3-COPD exacerbation:  Continue with nebulizer Pulmicort, appreciate pulmonology evaluation  4-Acute Metabolic Encephalopathy: Secondary to respiratory failure improved on BiPAP. Alert, and oriented. Conversant.   5-History of  mechanical aortic valve and mitral valve replacement on Coumadin:  Plan to discontinue heparin Gtt.  Continue with coumadin.   6-A. fib history of complete heart block s/p pacemaker. Change to oral metoprolol.  7-chronic kidney disease a stage IV; prior cr 1.5--2 Monitor on lasix.  Cr stable 1.9  8-Hypothyroidism: Continue with Synthroid 9-History of seizure continue with Keppra  Hyperkalemia; on IV lasix.  Resolved.  Anemia of chronic diseases; Monitor hb.   Pressure Injury 07/08/20 Buttocks Right Stage 2 -  Partial thickness loss of dermis presenting as a shallow open injury with a red, pink wound bed without slough. 2 cm x 2cm (Active)  07/08/20 0731  Location: Buttocks  Location Orientation: Right  Staging: Stage 2 -  Partial thickness loss of dermis presenting as a shallow open injury with a red, pink wound bed without slough.  Wound Description (Comments): 2 cm x 2cm  Present on Admission: No     Estimated body mass index is 31.59 kg/m as calculated from the following:   Height as of this encounter: 5\' 7"  (1.702 m).   Weight as of  this encounter: 91.5 kg.   DVT prophylaxis: heparin  Code Status: full code Family Communication: niece updated 4/29 Disposition Plan:  Status is: Inpatient  Remains inpatient appropriate because:IV treatments appropriate due to intensity of illness or  inability to take PO   Dispo: The patient is from: Home              Anticipated d/c is to: SNF              Patient currently is not medically stable to d/c.    Difficult to place patient No        Consultants:   CCM   Procedures:   NONE  Antimicrobials:    Subjective: She is alert and conversant. Denies chest pain. She report improvement of SOB  Objective: Vitals:   01/20/21 1929 01/20/21 2202 01/21/21 0500 01/21/21 0736  BP:  (!) 148/103    Pulse:  60    Resp:  20    Temp:  97.9 F (36.6 C)    TempSrc:  Oral    SpO2: 95% 97%  97%  Weight:   91.5 kg   Height:        Intake/Output Summary (Last 24 hours) at 01/21/2021 0847 Last data filed at 01/21/2021 5009 Gross per 24 hour  Intake 579.94 ml  Output 2500 ml  Net -1920.06 ml   Filed Weights   01/18/21 1612 01/19/21 0500 01/21/21 0500  Weight: 90.7 kg 87.3 kg 91.5 kg    Examination:  General exam: NAD Respiratory system; BL crackles.  Cardiovascular system: S 1, S 2 IRR Gastrointestinal system: BS present, soft, NT Central nervous system: Alert, following command  Extremities: Symmetric power.    Data Reviewed: I have personally reviewed following labs and imaging studies  CBC: Recent Labs  Lab 01/18/21 1114 01/18/21 1656 01/18/21 1945 01/19/21 0718 01/19/21 1821 01/20/21 0238 01/21/21 0513  WBC 5.1 5.4  --  4.3 4.1 6.0 7.5  NEUTROABS 3.7 4.3  --   --   --   --   --   HGB 10.5* 10.7* 9.2* 10.2* 9.9* 8.5* 8.5*  HCT 35.9* 36.4 27.0* 34.6* 33.5* 27.8* 27.9*  MCV 93.0 92.6  --  92.3 92.0 90.0 89.7  PLT 166 193  --  148* 169 159 381   Basic Metabolic Panel: Recent Labs  Lab 01/18/21 1142 01/18/21 1930 01/18/21 1945 01/19/21 0718 01/20/21 0238 01/21/21 0513  NA 139 143 142 145 141 139  K 4.6 4.5 4.7 5.5* 4.5 4.4  CL 99 107 101 102 102 97*  CO2 29 28  --  34* 30 31  GLUCOSE 104* 112* 115* 147* 119* 105*  BUN 87* 83* 96* 96* 93* 91*  CREATININE 2.84* 2.44* 2.80* 2.73* 2.27* 1.93*   CALCIUM 9.5 7.4*  --  9.1 8.9 8.9  PHOS 4.6  --   --   --   --   --    GFR: Estimated Creatinine Clearance: 24.3 mL/min (A) (by C-G formula based on SCr of 1.93 mg/dL (H)). Liver Function Tests: Recent Labs  Lab 01/18/21 1142 01/18/21 1930 01/19/21 0718  AST  --  32 41  ALT  --  20 25  ALKPHOS  --  38 46  BILITOT  --  0.4 0.4  PROT  --  5.7* 7.7  ALBUMIN 3.9 2.8* 3.7   No results for input(s): LIPASE, AMYLASE in the last 168 hours. Recent Labs  Lab 01/19/21 0718  AMMONIA 19   Coagulation Profile: Recent  Labs  Lab 01/18/21 1656 01/19/21 0718 01/20/21 0238 01/21/21 0513  INR 1.6* 1.8* 2.4* 2.9*   Cardiac Enzymes: No results for input(s): CKTOTAL, CKMB, CKMBINDEX, TROPONINI in the last 168 hours. BNP (last 3 results) No results for input(s): PROBNP in the last 8760 hours. HbA1C: No results for input(s): HGBA1C in the last 72 hours. CBG: Recent Labs  Lab 01/20/21 0702 01/20/21 1129 01/20/21 1700 01/21/21 0011 01/21/21 0554  GLUCAP 127* 121* 192* 155* 113*   Lipid Profile: No results for input(s): CHOL, HDL, LDLCALC, TRIG, CHOLHDL, LDLDIRECT in the last 72 hours. Thyroid Function Tests: No results for input(s): TSH, T4TOTAL, FREET4, T3FREE, THYROIDAB in the last 72 hours. Anemia Panel: No results for input(s): VITAMINB12, FOLATE, FERRITIN, TIBC, IRON, RETICCTPCT in the last 72 hours. Sepsis Labs: No results for input(s): PROCALCITON, LATICACIDVEN in the last 168 hours.  Recent Results (from the past 240 hour(s))  Resp Panel by RT-PCR (Flu A&B, Covid) Nasopharyngeal Swab     Status: None   Collection Time: 01/18/21  5:08 PM   Specimen: Nasopharyngeal Swab; Nasopharyngeal(NP) swabs in vial transport medium  Result Value Ref Range Status   SARS Coronavirus 2 by RT PCR NEGATIVE NEGATIVE Final    Comment: (NOTE) SARS-CoV-2 target nucleic acids are NOT DETECTED.  The SARS-CoV-2 RNA is generally detectable in upper respiratory specimens during the acute phase  of infection. The lowest concentration of SARS-CoV-2 viral copies this assay can detect is 138 copies/mL. A negative result does not preclude SARS-Cov-2 infection and should not be used as the sole basis for treatment or other patient management decisions. A negative result may occur with  improper specimen collection/handling, submission of specimen other than nasopharyngeal swab, presence of viral mutation(s) within the areas targeted by this assay, and inadequate number of viral copies(<138 copies/mL). A negative result must be combined with clinical observations, patient history, and epidemiological information. The expected result is Negative.  Fact Sheet for Patients:  EntrepreneurPulse.com.au  Fact Sheet for Healthcare Providers:  IncredibleEmployment.be  This test is no t yet approved or cleared by the [place] FDA and  has been authorized for detection and/or diagnosis of SARS-CoV-2 by FDA under an Emergency Use Authorization (EUA). This EUA will remain  in effect (meaning this test can be used) for the duration of the COVID-19 declaration under Section 564(b)(1) of the Act, 21 U.S.C.section 360bbb-3(b)(1), unless the authorization is terminated  or revoked sooner.       Influenza A by PCR NEGATIVE NEGATIVE Final   Influenza B by PCR NEGATIVE NEGATIVE Final    Comment: (NOTE) The Xpert Xpress SARS-CoV-2/FLU/RSV plus assay is intended as an aid in the diagnosis of influenza from Nasopharyngeal swab specimens and should not be used as a sole basis for treatment. Nasal washings and aspirates are unacceptable for Xpert Xpress SARS-CoV-2/FLU/RSV testing.  Fact Sheet for Patients: EntrepreneurPulse.com.au  Fact Sheet for Healthcare Providers: IncredibleEmployment.be  This test is not yet approved or cleared by the [place] FDA and has been authorized for detection and/or diagnosis of  SARS-CoV-2 by FDA under an Emergency Use Authorization (EUA). This EUA will remain in effect (meaning this test can be used) for the duration of the COVID-19 declaration under Section 564(b)(1) of the Act, 21 U.S.C. section 360bbb-3(b)(1), unless the authorization is terminated or revoked.  Performed at [facility], [place] [address], [place], [place] [address]   MRSA PCR Screening     Status: None   Collection Time: 01/18/21  9:58 PM  Specimen: Nasal Mucosa; Nasopharyngeal  Result Value Ref Range Status   MRSA by PCR NEGATIVE NEGATIVE Final    Comment:        The GeneXpert MRSA Assay (FDA approved for NASAL specimens only), is one component of a comprehensive MRSA colonization surveillance program. It is not intended to diagnose MRSA infection nor to guide or monitor treatment for MRSA infections. Performed at [facility], [place] [address] [place], [place] [phone]          Radiology Studies: [facility] Chest Port 1 View  Result Date: 01/20/2021 CLINICAL DATA:  Pulmonary edema. EXAM: PORTABLE CHEST 1 VIEW COMPARISON:  Chest x-ray 01/18/2021. FINDINGS: AICD in stable position. Prior cardiac valve replacement. Stable prominent cardiomegaly. Mild bilateral interstitial prominence. Mild interstitial edema and/or pneumonitis cannot be excluded. Progressive bibasilar atelectasis. Small left pleural effusion cannot be excluded. No pneumothorax. IMPRESSION: 1. AICD in stable position. Prior cardiac valve replacement. Stable prominent cardiomegaly. Mild bilateral interstitial prominence. Mild interstitial edema and/or pneumonitis cannot be excluded. 2. Progressive bibasilar atelectasis. Small left pleural effusion cannot be excluded. Electronically Signed   By: Converse   On: 01/20/2021 05:44        Scheduled Meds: . azithromycin  500 mg Oral Daily  . budesonide (PULMICORT) nebulizer solution  0.5 mg Nebulization BID  . chlorhexidine  15 mL  Mouth Rinse BID  . Chlorhexidine Gluconate Cloth  6 each Topical Daily  . furosemide  40 mg Intravenous Q12H  . gabapentin  300 mg Oral BID  . hydrALAZINE  10 mg Oral BID  . ipratropium-albuterol  3 mL Nebulization BID  . levETIRAcetam  500 mg Oral BID  . levothyroxine  88 mcg Oral QAC breakfast  . mouth rinse  15 mL Mouth Rinse q12n4p  . metoprolol tartrate  50 mg Oral BID  . predniSONE  40 mg Oral Q breakfast  . Warfarin - Pharmacist Dosing Inpatient   Does not apply q1600   Continuous Infusions:    LOS: 3 days    Time spent: 35 minutes.     Elmarie Shiley, MD [facility]   If 7PM-7AM, please contact night-coverage [email or web address]  01/21/2021, 8:47 AM

## 2021-01-22 DIAGNOSIS — I4819 Other persistent atrial fibrillation: Secondary | ICD-10-CM | POA: Diagnosis not present

## 2021-01-22 DIAGNOSIS — Z952 Presence of prosthetic heart valve: Secondary | ICD-10-CM | POA: Diagnosis not present

## 2021-01-22 DIAGNOSIS — J9601 Acute respiratory failure with hypoxia: Secondary | ICD-10-CM | POA: Diagnosis not present

## 2021-01-22 DIAGNOSIS — J9602 Acute respiratory failure with hypercapnia: Secondary | ICD-10-CM | POA: Diagnosis not present

## 2021-01-22 DIAGNOSIS — I5021 Acute systolic (congestive) heart failure: Secondary | ICD-10-CM | POA: Diagnosis not present

## 2021-01-22 DIAGNOSIS — Z95810 Presence of automatic (implantable) cardiac defibrillator: Secondary | ICD-10-CM

## 2021-01-22 LAB — CBC
HCT: 27.7 % — ABNORMAL LOW (ref 36.0–46.0)
Hemoglobin: 8.5 g/dL — ABNORMAL LOW (ref 12.0–15.0)
MCH: 27.4 pg (ref 26.0–34.0)
MCHC: 30.7 g/dL (ref 30.0–36.0)
MCV: 89.4 fL (ref 80.0–100.0)
Platelets: 168 10*3/uL (ref 150–400)
RBC: 3.1 MIL/uL — ABNORMAL LOW (ref 3.87–5.11)
RDW: 19.3 % — ABNORMAL HIGH (ref 11.5–15.5)
WBC: 7.2 10*3/uL (ref 4.0–10.5)
nRBC: 1.2 % — ABNORMAL HIGH (ref 0.0–0.2)

## 2021-01-22 LAB — GLUCOSE, CAPILLARY
Glucose-Capillary: 100 mg/dL — ABNORMAL HIGH (ref 70–99)
Glucose-Capillary: 121 mg/dL — ABNORMAL HIGH (ref 70–99)
Glucose-Capillary: 131 mg/dL — ABNORMAL HIGH (ref 70–99)
Glucose-Capillary: 142 mg/dL — ABNORMAL HIGH (ref 70–99)

## 2021-01-22 LAB — PROTIME-INR
INR: 2.8 — ABNORMAL HIGH (ref 0.8–1.2)
Prothrombin Time: 29.2 seconds — ABNORMAL HIGH (ref 11.4–15.2)

## 2021-01-22 LAB — BASIC METABOLIC PANEL
Anion gap: 10 (ref 5–15)
BUN: 93 mg/dL — ABNORMAL HIGH (ref 8–23)
CO2: 35 mmol/L — ABNORMAL HIGH (ref 22–32)
Calcium: 9.4 mg/dL (ref 8.9–10.3)
Chloride: 96 mmol/L — ABNORMAL LOW (ref 98–111)
Creatinine, Ser: 1.71 mg/dL — ABNORMAL HIGH (ref 0.44–1.00)
GFR, Estimated: 29 mL/min — ABNORMAL LOW (ref >60)
Glucose, Bld: 117 mg/dL — ABNORMAL HIGH (ref 70–99)
Potassium: 4.8 mmol/L (ref 3.5–5.1)
Sodium: 141 mmol/L (ref 135–145)

## 2021-01-22 MED ORDER — WARFARIN SODIUM 5 MG PO TABS
10.0000 mg | ORAL_TABLET | ORAL | Status: DC
  Administered 2021-01-22 – 2021-01-23 (×2): 10 mg via ORAL
  Filled 2021-01-22 (×2): qty 2

## 2021-01-22 MED ORDER — WARFARIN SODIUM 5 MG PO TABS: 5.0000 mg | ORAL_TABLET | ORAL | Status: DC

## 2021-01-22 NOTE — Progress Notes (Signed)
DAILY PROGRESS NOTE   Patient Name: [name] [name] Date of Encounter: 01/22/2021 Cardiologist: Sanda Klein, MD  Chief Complaint   Breathing better  Patient Profile   [name] [name] is a 87 y.o. 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 Tyrell Antonio.  Subjective   Good diuresis yesterday - 1.6L Negative, almost 5L negative. Creatinine improving with diuresis. INR 2.8 (mechanical valves)  Objective   Vitals:   01/21/21 2045 01/21/21 2124 01/22/21 0443 01/22/21 0500  BP:  (!) 159/63 (!) 154/67   Pulse:  (!) 59 (!) 59   Resp:  16 (!) 24   Temp:  98.1 F (36.7 C) (!) 97.5 F (36.4 C)   TempSrc:  Oral Oral   SpO2: 98% 97% 100%   Weight:    90.9 kg  Height:        Intake/Output Summary (Last 24 hours) at 01/22/2021 1638 Last data filed at 01/22/2021 0900 Gross per 24 hour  Intake 1094 ml  Output 2800 ml  Net -1706 ml   Filed Weights   01/19/21 0500 01/21/21 0500 01/22/21 0500  Weight: 87.3 kg 91.5 kg 90.9 kg    Physical Exam   General appearance: alert and no distress Neck: JVD - 3 cm above sternal notch, no carotid bruit and thyroid not enlarged, symmetric, no tenderness/mass/nodules Lungs: diminished breath sounds bibasilar Heart: regular rate and rhythm, S1, S2 normal and mid systolic click present Abdomen: soft, non-tender; bowel sounds normal; no masses,  no organomegaly Extremities: extremities normal, atraumatic, no cyanosis or edema Pulses: 2+ and symmetric Skin: Skin color, texture, turgor normal. No rashes or lesions Neurologic: Grossly normal PSych: Pleasant  Inpatient Medications    Scheduled Meds: . budesonide (PULMICORT) nebulizer  solution  0.5 mg Nebulization BID  . chlorhexidine  15 mL Mouth Rinse BID  . Chlorhexidine Gluconate Cloth  6 each Topical Daily  . furosemide  40 mg Intravenous Q12H  . gabapentin  300 mg Oral BID  . hydrALAZINE  10 mg Oral BID  . ipratropium-albuterol  3 mL Nebulization BID  . levETIRAcetam  500 mg Oral BID  . levothyroxine  88 mcg Oral QAC breakfast  . mouth rinse  15 mL Mouth Rinse q12n4p  . metoprolol tartrate  50 mg Oral BID  . predniSONE  40 mg Oral Q breakfast  . Warfarin - Pharmacist Dosing Inpatient   Does not apply q1600    Continuous Infusions:   PRN Meds: acetaminophen **OR** acetaminophen, hydrALAZINE, ondansetron   Labs   Results for orders placed or performed during the hospital encounter of 01/18/21 (from the past 48 hour(s))  Glucose, capillary     Status: Abnormal   Collection Time: 01/20/21 11:29 AM  Result Value Ref Range   Glucose-Capillary 121 (H) 70 - 99 mg/dL    Comment: Glucose reference range applies only to samples taken after fasting for at least 8 hours.   Comment 1 Notify RN    Comment 2 Document in Chart   Glucose, capillary     Status: Abnormal   Collection Time: 01/20/21  5:00 PM  Result Value Ref Range   Glucose-Capillary 192 (H) 70 - 99 mg/dL    Comment: Glucose reference range applies only to  samples taken after fasting for at least 8 hours.  Glucose, capillary     Status: Abnormal   Collection Time: 01/21/21 12:11 AM  Result Value Ref Range   Glucose-Capillary 155 (H) 70 - 99 mg/dL    Comment: Glucose reference range applies only to samples taken after fasting for at least 8 hours.  CBC     Status: Abnormal   Collection Time: 01/21/21  5:13 AM  Result Value Ref Range   WBC 7.5 4.0 - 10.5 K/uL   RBC 3.11 (L) 3.87 - 5.11 MIL/uL   Hemoglobin 8.5 (L) 12.0 - 15.0 g/dL   HCT 27.9 (L) 36.0 - 46.0 %   MCV 89.7 80.0 - 100.0 fL   MCH 27.3 26.0 - 34.0 pg   MCHC 30.5 30.0 - 36.0 g/dL   RDW 19.2 (H) 11.5 - 15.5 %   Platelets 157 150 - 400  K/uL   nRBC 1.1 (H) 0.0 - 0.2 %    Comment: Performed at [facility], [place] [address], [place], [place] [address]  Protime-INR     Status: Abnormal   Collection Time: 01/21/21  5:13 AM  Result Value Ref Range   Prothrombin Time 30.4 (H) 11.4 - 15.2 seconds   INR 2.9 (H) 0.8 - 1.2    Comment: (NOTE) INR goal varies based on device and disease states. Performed at [facility], [place] [address] [place], [place] [address]   Basic metabolic panel     Status: Abnormal   Collection Time: 01/21/21  5:13 AM  Result Value Ref Range   Sodium 139 135 - 145 mmol/L   Potassium 4.4 3.5 - 5.1 mmol/L   Chloride 97 (L) 98 - 111 mmol/L   CO2 31 22 - 32 mmol/L   Glucose, Bld 105 (H) 70 - 99 mg/dL    Comment: Glucose reference range applies only to samples taken after fasting for at least 8 hours.   BUN 91 (H) 8 - 23 mg/dL   Creatinine, Ser 1.93 (H) 0.44 - 1.00 mg/dL   Calcium 8.9 8.9 - 10.3 mg/dL   GFR, Estimated 25 (L) >60 mL/min    Comment: (NOTE) Calculated using the CKD-EPI Creatinine Equation (2021)    Anion gap 11 5 - 15    Comment: Performed at [facility], [place] [address], [place], [place] [address]  Heparin level (unfractionated)     Status: Abnormal   Collection Time: 01/21/21  5:13 AM  Result Value Ref Range   Heparin Unfractionated 0.27 (L) 0.30 - 0.70 IU/mL    Comment: (NOTE) The clinical reportable range upper limit is being lowered to >1.10 to align with the FDA approved guidance for the current laboratory assay.  If heparin results are below expected values, and patient dosage has  been confirmed, suggest follow up testing of antithrombin III levels. Performed at [facility], [place] [address] [place], [place] [address]   Glucose, capillary     Status: Abnormal   Collection Time: 01/21/21  5:54 AM  Result Value Ref Range   Glucose-Capillary 113 (H) 70 - 99 mg/dL    Comment: Glucose reference range  applies only to samples taken after fasting for at least 8 hours.  Glucose, capillary     Status: Abnormal   Collection Time: 01/21/21  3:29 PM  Result Value Ref Range   Glucose-Capillary 119 (H) 70 - 99 mg/dL    Comment: Glucose reference range applies only to samples taken after  fasting for at least 8 hours.  Glucose, capillary     Status: Abnormal   Collection Time: 01/21/21  5:59 PM  Result Value Ref Range   Glucose-Capillary 189 (H) 70 - 99 mg/dL    Comment: Glucose reference range applies only to samples taken after fasting for at least 8 hours.  Glucose, capillary     Status: Abnormal   Collection Time: 01/22/21 12:44 AM  Result Value Ref Range   Glucose-Capillary 121 (H) 70 - 99 mg/dL    Comment: Glucose reference range applies only to samples taken after fasting for at least 8 hours.  Protime-INR     Status: Abnormal   Collection Time: 01/22/21  4:39 AM  Result Value Ref Range   Prothrombin Time 29.2 (H) 11.4 - 15.2 seconds   INR 2.8 (H) 0.8 - 1.2    Comment: (NOTE) INR goal varies based on device and disease states. Performed at [facility], [place] [address] [place], [place] [address]   CBC     Status: Abnormal   Collection Time: 01/22/21  4:39 AM  Result Value Ref Range   WBC 7.2 4.0 - 10.5 K/uL   RBC 3.10 (L) 3.87 - 5.11 MIL/uL   Hemoglobin 8.5 (L) 12.0 - 15.0 g/dL   HCT 27.7 (L) 36.0 - 46.0 %   MCV 89.4 80.0 - 100.0 fL   MCH 27.4 26.0 - 34.0 pg   MCHC 30.7 30.0 - 36.0 g/dL   RDW 19.3 (H) 11.5 - 15.5 %   Platelets 168 150 - 400 K/uL   nRBC 1.2 (H) 0.0 - 0.2 %    Comment: Performed at [facility], [place] [address], [place], [place] [address]  Basic metabolic panel     Status: Abnormal   Collection Time: 01/22/21  4:39 AM  Result Value Ref Range   Sodium 141 135 - 145 mmol/L   Potassium 4.8 3.5 - 5.1 mmol/L   Chloride 96 (L) 98 - 111 mmol/L   CO2 35 (H) 22 - 32 mmol/L   Glucose, Bld 117 (H) 70 - 99 mg/dL    Comment:  Glucose reference range applies only to samples taken after fasting for at least 8 hours.   BUN 93 (H) 8 - 23 mg/dL   Creatinine, Ser 1.71 (H) 0.44 - 1.00 mg/dL   Calcium 9.4 8.9 - 10.3 mg/dL   GFR, Estimated 29 (L) >60 mL/min    Comment: (NOTE) Calculated using the CKD-EPI Creatinine Equation (2021)    Anion gap 10 5 - 15    Comment: Performed at [facility], [place] [address] [place], [place] [address]  Glucose, capillary     Status: Abnormal   Collection Time: 01/22/21  6:07 AM  Result Value Ref Range   Glucose-Capillary 100 (H) 70 - 99 mg/dL    Comment: Glucose reference range applies only to samples taken after fasting for at least 8 hours.   *Note: Due to a large number of results and/or encounters for the requested time period, some results have not been displayed. A complete set of results can be found in Results Review.    ECG   N/A  Telemetry   Paced rhythm, occasional PVC's - Personally Reviewed  Radiology    ECHOCARDIOGRAM COMPLETE  Result Date: 01/21/2021    ECHOCARDIOGRAM REPORT   Patient Name:   [name] [name] Date of Exam: 01/21/2021 Medical Rec #:  [id number]      Height:  67.0 in Accession #:    [id number]     Weight:       201.7 lb Date of Birth:  [birth year]      BSA:          2.030 m Patient Age:    71 years       BP:           148/103 mmHg Patient Gender: F              HR:           65 bpm. Exam Location:  Inpatient Procedure: 2D Echo Indications:    Congestive Heart Failure I50.9  History:        Patient has prior history of Echocardiogram examinations, most                 recent 01/31/2020. Defibrillator; Risk Factors:Dyslipidemia and                 Diabetes.                 Aortic Valve: mechanical valve is present in the aortic                 position. Procedure Date: 2001.                 Mitral Valve: mechanical valve valve is present in the mitral                 position. Procedure Date: 2004.  Sonographer:    Mikki Santee RDCS  (AE) Referring Phys: [id number] BELKYS A REGALADO IMPRESSIONS  1. Left ventricular ejection fraction, by estimation, is 25 to 30%. The left ventricle has severely decreased function. The left ventricle demonstrates global hypokinesis. The left ventricular internal cavity size was moderately dilated. There is mild concentric left ventricular hypertrophy. Left ventricular diastolic parameters are indeterminate.  2. Right ventricular systolic function is normal. The right ventricular size is normal. There is severely elevated pulmonary artery systolic pressure.  3. Left atrial size was severely dilated.  4. Right atrial size was severely dilated.  5. A small pericardial effusion is present. The pericardial effusion is posterior to the left ventricle.  6. Mitral valve gradient essentially unchanged form 01/2020. The mitral valve has been repaired/replaced. No evidence of mitral valve regurgitation. No evidence of mitral stenosis. The mean mitral valve gradient is 6.0 mmHg. There is a mechanical valve present in the mitral position. Procedure Date: 2004.  7. Tricuspid valve regurgitation is mild to moderate.  8. The aortic valve has been repaired/replaced. Aortic valve regurgitation is not visualized. No aortic stenosis is present. There is a mechanical valve present in the aortic position. Procedure Date: 2001. Echo findings are consistent with normal structure and function of the aortic valve prosthesis. Aortic valve area, by VTI measures 1.32 cm. Aortic valve mean gradient measures 7.0 mmHg. Aortic valve Vmax measures 2.00 m/s.  9. Aortic dilatation noted. There is mild dilatation of the ascending aorta, measuring 38 mm. 10. The inferior vena cava is dilated in size with <50% respiratory variability, suggesting right atrial pressure of 15 mmHg. FINDINGS  Left Ventricle: Left ventricular ejection fraction, by estimation, is 25 to 30%. The left ventricle has severely decreased function. The left ventricle demonstrates global  hypokinesis. The left ventricular internal cavity size was moderately dilated. There is mild concentric left ventricular hypertrophy. Left ventricular diastolic parameters are indeterminate. Right Ventricle: The right ventricular size is normal. No increase in right  ventricular wall thickness. Right ventricular systolic function is normal. There is severely elevated pulmonary artery systolic pressure. The tricuspid regurgitant velocity is 3.89 m/s, and with an assumed right atrial pressure of 15 mmHg, the estimated right ventricular systolic pressure is 76.5 mmHg. Left Atrium: Left atrial size was severely dilated. Right Atrium: Right atrial size was severely dilated. Pericardium: A small pericardial effusion is present. The pericardial effusion is posterior to the left ventricle. Mitral Valve: Mitral valve gradient essentially unchanged form 01/2020. The mitral valve has been repaired/replaced. No evidence of mitral valve regurgitation. There is a mechanical valve present in the mitral position. Procedure Date: 2004. No evidence of mitral valve stenosis. MV peak gradient, 15.8 mmHg. The mean mitral valve gradient is 6.0 mmHg with average heart rate of 59 bpm. Tricuspid Valve: The tricuspid valve is normal in structure. Tricuspid valve regurgitation is mild to moderate. No evidence of tricuspid stenosis. Aortic Valve: The aortic valve has been repaired/replaced. Aortic valve regurgitation is not visualized. No aortic stenosis is present. Aortic valve mean gradient measures 7.0 mmHg. Aortic valve peak gradient measures 16.0 mmHg. Aortic valve area, by VTI  measures 1.32 cm. There is a mechanical valve present in the aortic position. Procedure Date: 2001. Echo findings are consistent with normal structure and function of the aortic valve prosthesis. Pulmonic Valve: The pulmonic valve was normal in structure. Pulmonic valve regurgitation is mild. No evidence of pulmonic stenosis. Aorta: Aortic dilatation noted. There  is mild dilatation of the ascending aorta, measuring 38 mm. Venous: The inferior vena cava is dilated in size with less than 50% respiratory variability, suggesting right atrial pressure of 15 mmHg. IAS/Shunts: No atrial level shunt detected by color flow Doppler. Additional Comments: A venous catheter is visualized.  LEFT VENTRICLE PLAX 2D LVIDd:         6.35 cm  Diastology LVIDs:         5.49 cm  LV e' medial:    5.78 cm/s LV PW:         0.96 cm  LV E/e' medial:  33.2 LV IVS:        1.10 cm  LV e' lateral:   5.61 cm/s LVOT diam:     2.20 cm  LV E/e' lateral: 34.2 LV SV:         55 LV SV Index:   27 LVOT Area:     3.80 cm  RIGHT VENTRICLE TAPSE (M-mode): 1.2 cm LEFT ATRIUM            Index       RIGHT ATRIUM           Index LA diam:      4.70 cm  2.32 cm/m  RA Area:     33.90 cm LA Vol (A2C): 78.8 ml  38.82 ml/m RA Volume:   140.00 ml 68.98 ml/m LA Vol (A4C): 103.0 ml 50.75 ml/m  AORTIC VALVE AV Area (Vmax):    1.85 cm AV Area (Vmean):   1.71 cm AV Area (VTI):     1.32 cm AV Vmax:           200.00 cm/s AV Vmean:          124.000 cm/s AV VTI:            0.419 m AV Peak Grad:      16.0 mmHg AV Mean Grad:      7.0 mmHg LVOT Vmax:         97.50 cm/s LVOT Vmean:  55.900 cm/s LVOT VTI:          0.145 m LVOT/AV VTI ratio: 0.35  AORTA Ao Root diam: 3.10 cm MITRAL VALVE                TRICUSPID VALVE MV Area (PHT): 3.17 cm     TR Peak grad:   60.5 mmHg MV Area VTI:   1.17 cm     TR Vmax:        389.00 cm/s MV Peak grad:  15.8 mmHg MV Mean grad:  6.0 mmHg     SHUNTS MV Vmax:       1.99 m/s     Systemic VTI:  0.14 m MV Vmean:      118.0 cm/s   Systemic Diam: 2.20 cm MV Decel Time: 239 msec MV E velocity: 192.00 cm/s MV A velocity: 66.10 cm/s MV E/A ratio:  2.90 Skeet Latch MD Electronically signed by Skeet Latch MD Signature Date/Time: 01/21/2021/11:43:07 AM    Final     Cardiac Studies   N/A  Assessment   1. Principal Problem: 2.   Acute respiratory failure with hypoxia and hypercapnia  (HCC) 3. Active Problems: 4.   Hx of aortic valve replacement, mechanical 5.   S/P MVR (mitral valve replacement) 6.   Chronic anticoagulation, on coumadin for Mechanical AVR and MVR 7.   S/P ICD (internal cardiac defibrillator) procedure 8.   Persistent atrial fibrillation ([place]) 9.   COPD with acute exacerbation ([place]) 10.   Type 2 diabetes mellitus with hypoglycemia without coma ([place]) 11.   Acute systolic CHF (congestive heart failure) ([place]) 12.   Stage 3b chronic kidney disease ([place]) 13.   Seizure ([place]) 14.   Acute respiratory failure with hypoxia (HCC) 15.   Plan   1. Continue IV diuresis as long as creatinine continues to decline and she remains net negative. INR 2.8 - mechanical valves. Paced rhythm with occasional PVC's. D/w Dr. Dwyane Dee -hospitalist.  Time Spent Directly with Patient:  I have spent a total of 25 minutes with the patient reviewing hospital notes, telemetry, EKGs, labs and examining the patient as well as establishing an assessment and plan that was discussed personally with the patient.  > 50% of time was spent in direct patient care.  Length of Stay:  LOS: 4 days   Pixie Casino, MD, [facility], [place] Director of the Advanced Lipid Disorders &  Cardiovascular Risk Reduction Clinic Diplomate of the American Board of Clinical Lipidology Attending Cardiologist  Direct Dial: [phone]  Fax: [phone]  Website:  [email or web address] 01/22/2021, 9:42 AM

## 2021-01-22 NOTE — Progress Notes (Addendum)
PROGRESS NOTE    [name] [name]  QVZ:[id number] DOB: 1932/03/31 DOA: 01/18/2021 PCP: Fayrene Helper, MD   Brief Narrative: This 87 years old female with PMH significant for COPD, chronic combined systolic and diastolic heart failure, complete heart block s/p [employer] pacemaker, AICD, A. fib, history of mechanical aortic valve on Coumadin, chronic kidney disease stage IIIb, hypothyroidism, seizure disorder presented in the ED with progressive shortness of breath.  Patient has received a dose of Retacrit prior to coming to the hospital. Patient was found to be lethargic on arrival in the ED with ABG showing pH of 7.2 , PCO2 of 80.  chest x-ray shows pulmonary congestion.  Patient was started on BiPAP, nebulization and IV Lasix.  Patient improved on BiPAP and for treatment for heart failure and COPD exacerbation. Patient is admitted for acute hypoxic and hypercapnic respiratory failure secondary to COPD and CHF exacerbation.  Assessment & Plan:   Principal Problem:   Acute respiratory failure with hypoxia and hypercapnia (HCC) Active Problems:   Hx of aortic valve replacement, mechanical   S/P MVR (mitral valve replacement)   Chronic anticoagulation, on coumadin for Mechanical AVR and MVR   S/P ICD (internal cardiac defibrillator) procedure   Persistent atrial fibrillation (HCC)   COPD with acute exacerbation (HCC)   Type 2 diabetes mellitus with hypoglycemia without coma (HCC)   Acute systolic CHF (congestive heart failure) (HCC)   Stage 3b chronic kidney disease (HCC)   Seizure (HCC)   Acute respiratory failure with hypoxia (HCC)  Acute hypoxic and hypercapnic respiratory failure in the setting of COPD and heart failure exacerbation. Patient presented with hypoxic and hypercapnic respiratory failure. She was lethargic on arrival with a pH of 7.2,  PCO2 of 80. Patient was initially placed on BiPAP and she improved. She is off BiPAP now. Continued with IV Lasix, Solu-Medrol and  nebulization. She is 1.6 L negative balance in the last 24 hours. So far she is 5 L negative since admission. Continue IV Lasix 40 mg IV twice daily for today. Solu-Medrol changed to prednisone taper. Continue supplemental oxygen to keep saturation above 94%. Continue incentive spirometry  Acute on chronic combined systolic and diastolic heart failure: Continue with IV Lasix twice daily, monitor intake and output. Urine output 1.6 L in last 24 hours. Echo shows LVEF 25 to 30%. Cardiology recommended to continue iv diuresis.  COPD exacerbation: Continue with nebulizer, Pulmicort. Continue supplemental oxygen.  AKI on CKD stage IIIb: Serum creatinine improving and almost back to the baseline.  Acute metabolic encephalopathy: > Resolved Patient was lethargic on arrival secondary to PCO2 retention which has improved with BiPAP. Patient is alert and oriented, back to her baseline mental status, she is fully involved in conversation  History of mechanical aortic valve and mitral valve replacement on Coumadin INR therapeutic,  continue with Coumadin.  Hypothyroidism: Continue Synthroid.  History of seizure disorder: Continue Keppra  Hypokalemia: Resolved   Anemia of chronic disease:   monitor H&H. Hb stable 8.5  . Pressure Injury 07/08/20 Buttocks Right Stage 2 -  Partial thickness loss of dermis presenting as a shallow open injury with a red, pink wound bed without slough. 2 cm x 2cm (Active)  07/08/20 0731  Location: Buttocks  Location Orientation: Right  Staging: Stage 2 -  Partial thickness loss of dermis presenting as a shallow open injury with a red, pink wound bed without slough.  Wound Description (Comments): 2 cm x 2cm  Present on Admission: No  DVT prophylaxis: Heparin Code Status:  Full code. Family Communication: No family at bed side. Disposition Plan:   Status is: Inpatient  Remains inpatient appropriate because:Inpatient level of care appropriate  due to severity of illness   Dispo: The patient is from: Home              Anticipated d/c is to: SNF              Patient currently is not medically stable to d/c.   Difficult to place patient No  Consultants:    Cardiology  Procedures:  Antimicrobials:  Anti-infectives (From admission, onward)   Start     Dose/Rate Route Frequency Ordered Stop   01/20/21 1600  azithromycin (ZITHROMAX) tablet 500 mg        500 mg Oral Daily 01/20/21 0850 01/22/21 0933   01/18/21 2200  azithromycin (ZITHROMAX) 500 mg in sodium chloride 0.9 % 250 mL IVPB  Status:  Discontinued        500 mg 250 mL/hr over 60 Minutes Intravenous Every 24 hours 01/18/21 2136 01/20/21 0850      Subjective: Patient was seen and examined at bedside.  Overnight events noted.  Patient reports feeling much improved. She reports leg swelling has improved,  she is able to breathe better,  denies any chest pain.  Objective: Vitals:   01/21/21 2124 01/22/21 0443 01/22/21 0500 01/22/21 1325  BP: (!) 159/63 (!) 154/67  (!) 148/70  Pulse: (!) 59 (!) 59  (!) 59  Resp: 16 (!) 24  14  Temp: 98.1 F (36.7 C) (!) 97.5 F (36.4 C)  98.3 F (36.8 C)  TempSrc: Oral Oral    SpO2: 97% 100%  100%  Weight:   90.9 kg   Height:        Intake/Output Summary (Last 24 hours) at 01/22/2021 1345 Last data filed at 01/22/2021 1300 Gross per 24 hour  Intake 739 ml  Output 3500 ml  Net -2761 ml   Filed Weights   01/19/21 0500 01/21/21 0500 01/22/21 0500  Weight: 87.3 kg 91.5 kg 90.9 kg    Examination:  General exam: Appears calm and comfortable, not in any acute distress.  Respiratory system: Clear to auscultation. Respiratory effort normal. Cardiovascular system: S1 & S2 heard, RRR. No JVD, murmurs, rubs, gallops or clicks. No pedal edema. Gastrointestinal system: Abdomen is nondistended, soft and nontender. No organomegaly or masses felt. Normal bowel sounds heard. Central nervous system: Alert and oriented. No focal  neurological deficits. Extremities: Symmetric 5 x 5 power. No edema, no cyanosis, no clubbing Skin: No rashes, lesions or ulcers Psychiatry: Judgement and insight appear normal. Mood & affect appropriate.     Data Reviewed: I have personally reviewed following labs and imaging studies  CBC: Recent Labs  Lab 01/18/21 1114 01/18/21 1656 01/18/21 1945 01/19/21 0718 01/19/21 1821 01/20/21 0238 01/21/21 0513 01/22/21 0439  WBC 5.1 5.4  --  4.3 4.1 6.0 7.5 7.2  NEUTROABS 3.7 4.3  --   --   --   --   --   --   HGB 10.5* 10.7*   < > 10.2* 9.9* 8.5* 8.5* 8.5*  HCT 35.9* 36.4   < > 34.6* 33.5* 27.8* 27.9* 27.7*  MCV 93.0 92.6  --  92.3 92.0 90.0 89.7 89.4  PLT 166 193  --  148* 169 159 157 168   < > = values in this interval not displayed.   Basic Metabolic Panel: Recent Labs  Lab 01/18/21 1142  01/18/21 1930 01/18/21 1945 01/19/21 0718 01/20/21 0238 01/21/21 0513 01/22/21 0439  NA 139 143 142 145 141 139 141  K 4.6 4.5 4.7 5.5* 4.5 4.4 4.8  CL 99 107 101 102 102 97* 96*  CO2 29 28  --  34* 30 31 35*  GLUCOSE 104* 112* 115* 147* 119* 105* 117*  BUN 87* 83* 96* 96* 93* 91* 93*  CREATININE 2.84* 2.44* 2.80* 2.73* 2.27* 1.93* 1.71*  CALCIUM 9.5 7.4*  --  9.1 8.9 8.9 9.4  PHOS 4.6  --   --   --   --   --   --    GFR: Estimated Creatinine Clearance: 27.3 mL/min (A) (by C-G formula based on SCr of 1.71 mg/dL (H)). Liver Function Tests: Recent Labs  Lab 01/18/21 1142 01/18/21 1930 01/19/21 0718  AST  --  32 41  ALT  --  20 25  ALKPHOS  --  38 46  BILITOT  --  0.4 0.4  PROT  --  5.7* 7.7  ALBUMIN 3.9 2.8* 3.7   No results for input(s): LIPASE, AMYLASE in the last 168 hours. Recent Labs  Lab 01/19/21 0718  AMMONIA 19   Coagulation Profile: Recent Labs  Lab 01/18/21 1656 01/19/21 0718 01/20/21 0238 01/21/21 0513 01/22/21 0439  INR 1.6* 1.8* 2.4* 2.9* 2.8*   Cardiac Enzymes: No results for input(s): CKTOTAL, CKMB, CKMBINDEX, TROPONINI in the last 168  hours. BNP (last 3 results) No results for input(s): PROBNP in the last 8760 hours. HbA1C: No results for input(s): HGBA1C in the last 72 hours. CBG: Recent Labs  Lab 01/21/21 1529 01/21/21 1759 01/22/21 0044 01/22/21 0607 01/22/21 1144  GLUCAP 119* 189* 121* 100* 142*   Lipid Profile: No results for input(s): CHOL, HDL, LDLCALC, TRIG, CHOLHDL, LDLDIRECT in the last 72 hours. Thyroid Function Tests: No results for input(s): TSH, T4TOTAL, FREET4, T3FREE, THYROIDAB in the last 72 hours. Anemia Panel: No results for input(s): VITAMINB12, FOLATE, FERRITIN, TIBC, IRON, RETICCTPCT in the last 72 hours. Sepsis Labs: No results for input(s): PROCALCITON, LATICACIDVEN in the last 168 hours.  Recent Results (from the past 240 hour(s))  Resp Panel by RT-PCR (Flu A&B, Covid) Nasopharyngeal Swab     Status: None   Collection Time: 01/18/21  5:08 PM   Specimen: Nasopharyngeal Swab; Nasopharyngeal(NP) swabs in vial transport medium  Result Value Ref Range Status   SARS Coronavirus 2 by RT PCR NEGATIVE NEGATIVE Final    Comment: (NOTE) SARS-CoV-2 target nucleic acids are NOT DETECTED.  The SARS-CoV-2 RNA is generally detectable in upper respiratory specimens during the acute phase of infection. The lowest concentration of SARS-CoV-2 viral copies this assay can detect is 138 copies/mL. A negative result does not preclude SARS-Cov-2 infection and should not be used as the sole basis for treatment or other patient management decisions. A negative result may occur with  improper specimen collection/handling, submission of specimen other than nasopharyngeal swab, presence of viral mutation(s) within the areas targeted by this assay, and inadequate number of viral copies(<138 copies/mL). A negative result must be combined with clinical observations, patient history, and epidemiological information. The expected result is Negative.  Fact Sheet for Patients:   EntrepreneurPulse.com.au  Fact Sheet for Healthcare Providers:  IncredibleEmployment.be  This test is no t yet approved or cleared by the [place] FDA and  has been authorized for detection and/or diagnosis of SARS-CoV-2 by FDA under an Emergency Use Authorization (EUA). This EUA will remain  in effect (meaning this test  can be used) for the duration of the COVID-19 declaration under Section 564(b)(1) of the Act, 21 U.S.C.section 360bbb-3(b)(1), unless the authorization is terminated  or revoked sooner.       Influenza A by PCR NEGATIVE NEGATIVE Final   Influenza B by PCR NEGATIVE NEGATIVE Final    Comment: (NOTE) The Xpert Xpress SARS-CoV-2/FLU/RSV plus assay is intended as an aid in the diagnosis of influenza from Nasopharyngeal swab specimens and should not be used as a sole basis for treatment. Nasal washings and aspirates are unacceptable for Xpert Xpress SARS-CoV-2/FLU/RSV testing.  Fact Sheet for Patients: EntrepreneurPulse.com.au  Fact Sheet for Healthcare Providers: IncredibleEmployment.be  This test is not yet approved or cleared by the [place] FDA and has been authorized for detection and/or diagnosis of SARS-CoV-2 by FDA under an Emergency Use Authorization (EUA). This EUA will remain in effect (meaning this test can be used) for the duration of the COVID-19 declaration under Section 564(b)(1) of the Act, 21 U.S.C. section 360bbb-3(b)(1), unless the authorization is terminated or revoked.  Performed at [facility], [place] [address], [place], [place] [address]   MRSA PCR Screening     Status: None   Collection Time: 01/18/21  9:58 PM   Specimen: Nasal Mucosa; Nasopharyngeal  Result Value Ref Range Status   MRSA by PCR NEGATIVE NEGATIVE Final    Comment:        The GeneXpert MRSA Assay (FDA approved for NASAL specimens only), is one component of  a comprehensive MRSA colonization surveillance program. It is not intended to diagnose MRSA infection nor to guide or monitor treatment for MRSA infections. Performed at [facility], [place] [address] [place] [address] [place], [place] [address]     Radiology Studies: ECHOCARDIOGRAM COMPLETE  Result Date: 01/21/2021    ECHOCARDIOGRAM REPORT   Patient Name:   [name] [name] Date of Exam: 01/21/2021 Medical Rec #:  [id number]      Height:       67.0 in Accession #:    [id number]     Weight:       201.7 lb Date of Birth:  [birth year]      BSA:          2.030 m Patient Age:    54 years       BP:           148/103 mmHg Patient Gender: F              HR:           65 bpm. Exam Location:  Inpatient Procedure: 2D Echo Indications:    Congestive Heart Failure I50.9  History:        Patient has prior history of Echocardiogram examinations, most                 recent 01/31/2020. Defibrillator; Risk Factors:Dyslipidemia and                 Diabetes.                 Aortic Valve: mechanical valve is present in the aortic                 position. Procedure Date: 2001.                 Mitral Valve: mechanical valve valve is present in the mitral                 position. Procedure Date: 2004.  Sonographer:    Mikki Santee RDCS (AE) Referring Phys: [id number] BELKYS A REGALADO IMPRESSIONS  1. Left ventricular ejection fraction, by estimation, is 25 to 30%. The left ventricle has severely decreased function. The left ventricle demonstrates global hypokinesis. The left ventricular internal cavity size was moderately dilated. There is mild concentric left ventricular hypertrophy. Left ventricular diastolic parameters are indeterminate.  2. Right ventricular systolic function is normal. The right ventricular size is normal. There is severely elevated pulmonary artery systolic pressure.  3. Left atrial size was severely dilated.  4. Right atrial size was severely dilated.  5. A small pericardial effusion is present. The  pericardial effusion is posterior to the left ventricle.  6. Mitral valve gradient essentially unchanged form 01/2020. The mitral valve has been repaired/replaced. No evidence of mitral valve regurgitation. No evidence of mitral stenosis. The mean mitral valve gradient is 6.0 mmHg. There is a mechanical valve present in the mitral position. Procedure Date: 2004.  7. Tricuspid valve regurgitation is mild to moderate.  8. The aortic valve has been repaired/replaced. Aortic valve regurgitation is not visualized. No aortic stenosis is present. There is a mechanical valve present in the aortic position. Procedure Date: 2001. Echo findings are consistent with normal structure and function of the aortic valve prosthesis. Aortic valve area, by VTI measures 1.32 cm. Aortic valve mean gradient measures 7.0 mmHg. Aortic valve Vmax measures 2.00 m/s.  9. Aortic dilatation noted. There is mild dilatation of the ascending aorta, measuring 38 mm. 10. The inferior vena cava is dilated in size with <50% respiratory variability, suggesting right atrial pressure of 15 mmHg. FINDINGS  Left Ventricle: Left ventricular ejection fraction, by estimation, is 25 to 30%. The left ventricle has severely decreased function. The left ventricle demonstrates global hypokinesis. The left ventricular internal cavity size was moderately dilated. There is mild concentric left ventricular hypertrophy. Left ventricular diastolic parameters are indeterminate. Right Ventricle: The right ventricular size is normal. No increase in right ventricular wall thickness. Right ventricular systolic function is normal. There is severely elevated pulmonary artery systolic pressure. The tricuspid regurgitant velocity is 3.89 m/s, and with an assumed right atrial pressure of 15 mmHg, the estimated right ventricular systolic pressure is 24.2 mmHg. Left Atrium: Left atrial size was severely dilated. Right Atrium: Right atrial size was severely dilated. Pericardium: A  small pericardial effusion is present. The pericardial effusion is posterior to the left ventricle. Mitral Valve: Mitral valve gradient essentially unchanged form 01/2020. The mitral valve has been repaired/replaced. No evidence of mitral valve regurgitation. There is a mechanical valve present in the mitral position. Procedure Date: 2004. No evidence of mitral valve stenosis. MV peak gradient, 15.8 mmHg. The mean mitral valve gradient is 6.0 mmHg with average heart rate of 59 bpm. Tricuspid Valve: The tricuspid valve is normal in structure. Tricuspid valve regurgitation is mild to moderate. No evidence of tricuspid stenosis. Aortic Valve: The aortic valve has been repaired/replaced. Aortic valve regurgitation is not visualized. No aortic stenosis is present. Aortic valve mean gradient measures 7.0 mmHg. Aortic valve peak gradient measures 16.0 mmHg. Aortic valve area, by VTI  measures 1.32 cm. There is a mechanical valve present in the aortic position. Procedure Date: 2001. Echo findings are consistent with normal structure and function of the aortic valve prosthesis. Pulmonic Valve: The pulmonic valve was normal in structure. Pulmonic valve regurgitation is mild. No evidence of pulmonic stenosis. Aorta: Aortic dilatation noted. There is mild dilatation of the ascending aorta, measuring 38 mm. Venous: The  inferior vena cava is dilated in size with less than 50% respiratory variability, suggesting right atrial pressure of 15 mmHg. IAS/Shunts: No atrial level shunt detected by color flow Doppler. Additional Comments: A venous catheter is visualized.  LEFT VENTRICLE PLAX 2D LVIDd:         6.35 cm  Diastology LVIDs:         5.49 cm  LV e' medial:    5.78 cm/s LV PW:         0.96 cm  LV E/e' medial:  33.2 LV IVS:        1.10 cm  LV e' lateral:   5.61 cm/s LVOT diam:     2.20 cm  LV E/e' lateral: 34.2 LV SV:         55 LV SV Index:   27 LVOT Area:     3.80 cm  RIGHT VENTRICLE TAPSE (M-mode): 1.2 cm LEFT ATRIUM             Index       RIGHT ATRIUM           Index LA diam:      4.70 cm  2.32 cm/m  RA Area:     33.90 cm LA Vol (A2C): 78.8 ml  38.82 ml/m RA Volume:   140.00 ml 68.98 ml/m LA Vol (A4C): 103.0 ml 50.75 ml/m  AORTIC VALVE AV Area (Vmax):    1.85 cm AV Area (Vmean):   1.71 cm AV Area (VTI):     1.32 cm AV Vmax:           200.00 cm/s AV Vmean:          124.000 cm/s AV VTI:            0.419 m AV Peak Grad:      16.0 mmHg AV Mean Grad:      7.0 mmHg LVOT Vmax:         97.50 cm/s LVOT Vmean:        55.900 cm/s LVOT VTI:          0.145 m LVOT/AV VTI ratio: 0.35  AORTA Ao Root diam: 3.10 cm MITRAL VALVE                TRICUSPID VALVE MV Area (PHT): 3.17 cm     TR Peak grad:   60.5 mmHg MV Area VTI:   1.17 cm     TR Vmax:        389.00 cm/s MV Peak grad:  15.8 mmHg MV Mean grad:  6.0 mmHg     SHUNTS MV Vmax:       1.99 m/s     Systemic VTI:  0.14 m MV Vmean:      118.0 cm/s   Systemic Diam: 2.20 cm MV Decel Time: 239 msec MV E velocity: 192.00 cm/s MV A velocity: 66.10 cm/s MV E/A ratio:  2.90 Skeet Latch MD Electronically signed by Skeet Latch MD Signature Date/Time: 01/21/2021/11:43:07 AM    Final     Scheduled Meds: . budesonide (PULMICORT) nebulizer solution  0.5 mg Nebulization BID  . chlorhexidine  15 mL Mouth Rinse BID  . Chlorhexidine Gluconate Cloth  6 each Topical Daily  . furosemide  40 mg Intravenous Q12H  . gabapentin  300 mg Oral BID  . hydrALAZINE  10 mg Oral BID  . ipratropium-albuterol  3 mL Nebulization BID  . levETIRAcetam  500 mg Oral BID  . levothyroxine  88 mcg Oral QAC breakfast  .  mouth rinse  15 mL Mouth Rinse q12n4p  . metoprolol tartrate  50 mg Oral BID  . predniSONE  40 mg Oral Q breakfast  . warfarin  10 mg Oral Once per day on Sun Tue Wed Thu Sat   And  . [START ON 01/24/2021] warfarin  5 mg Oral Once per day on Mon Fri  . Warfarin - Pharmacist Dosing Inpatient   Does not apply q1600   Continuous Infusions:   LOS: 4 days    Time spent: 35 mins    Zechariah Bissonnette, MD [facility]   If 7PM-7AM, please contact night-coverage

## 2021-01-22 NOTE — Progress Notes (Signed)
ANTICOAGULATION CONSULT NOTE - Follow Up Consult  Pharmacy Consult for Warfarin Indication: aortic and mitral mechanical valve replacement  Allergies  Allergen Reactions  . Penicillins Other (See Comments)    Bruise    . Aspirin Other (See Comments)    On coumadin  . Fentanyl Dermatitis    Rash with patch   . Niacin Itching   Patient Measurements: Height: 5\' 7"  (170.2 cm) Weight: 90.9 kg (200 lb 6.4 oz) IBW/kg (Calculated) : 61.6 Heparin Dosing Weight: 81 kg  Vital Signs: Temp: 97.5 F (36.4 C) (04/30 0443) Temp Source: Oral (04/30 0443) BP: 154/67 (04/30 0443) Pulse Rate: 59 (04/30 0443)  Labs: Recent Labs    01/19/21 1715 01/19/21 1821 01/20/21 0238 01/21/21 0513 01/22/21 0439  HGB  --    < > 8.5* 8.5* 8.5*  HCT  --    < > 27.8* 27.9* 27.7*  PLT  --    < > 159 157 168  LABPROT  --   --  25.9* 30.4* 29.2*  INR  --   --  2.4* 2.9* 2.8*  HEPARINUNFRC 0.65  --  0.30 0.27*  --   CREATININE  --   --  2.27* 1.93* 1.71*   < > = values in this interval not displayed.   Estimated Creatinine Clearance: 27.3 mL/min (A) (by C-G formula based on SCr of 1.71 mg/dL (H)).  Assessment: 41 yoF admitted on 4/26 with acute respiratory failure.  PMH significant for CHF, COPD, complete heart block s/p defib pacemaker, CKD-IV, mechanical aortic valve replacement, mitral valve replacement on chronic warfarin anticoagulation.  INR on admission is 1.6.  Pharmacy consulted to begin IV heparin when INR < 2.5 due to NPO status.  Home warfarin 10mg  daily.  LD on 4/25. Heparin drip stopped 4/29 AM  01/22/2021   INR 2.9 is therapeutic  No bleeding reported  Hgb = 8.5, PLTC 168 - stable   Goal of Therapy:   INR 2.5 - 3.5 Heparin level 0.3-0.7 units/ml Monitor platelets by anticoagulation protocol: Yes   Plan:  Resume PTA dose of Warfarin 10 mg daily except 5 mg on Mondays & Fridays Daily Protime/INR- may be able to decrease to MWF INRs soon  Eudelia Bunch,  Pharm.D 01/22/2021 10:19 AM

## 2021-01-23 ENCOUNTER — Inpatient Hospital Stay (HOSPITAL_COMMUNITY): Payer: Medicare Other

## 2021-01-23 ENCOUNTER — Other Ambulatory Visit: Payer: Self-pay | Admitting: Family Medicine

## 2021-01-23 DIAGNOSIS — M7989 Other specified soft tissue disorders: Secondary | ICD-10-CM | POA: Diagnosis not present

## 2021-01-23 DIAGNOSIS — Z7901 Long term (current) use of anticoagulants: Secondary | ICD-10-CM | POA: Diagnosis not present

## 2021-01-23 DIAGNOSIS — M79602 Pain in left arm: Secondary | ICD-10-CM

## 2021-01-23 DIAGNOSIS — J9602 Acute respiratory failure with hypercapnia: Secondary | ICD-10-CM | POA: Diagnosis not present

## 2021-01-23 DIAGNOSIS — Z952 Presence of prosthetic heart valve: Secondary | ICD-10-CM | POA: Diagnosis not present

## 2021-01-23 DIAGNOSIS — I5021 Acute systolic (congestive) heart failure: Secondary | ICD-10-CM | POA: Diagnosis not present

## 2021-01-23 DIAGNOSIS — J9601 Acute respiratory failure with hypoxia: Secondary | ICD-10-CM | POA: Diagnosis not present

## 2021-01-23 LAB — COMPREHENSIVE METABOLIC PANEL
ALT: 29 U/L (ref 0–44)
AST: 34 U/L (ref 15–41)
Albumin: 3.3 g/dL — ABNORMAL LOW (ref 3.5–5.0)
Alkaline Phosphatase: 39 U/L (ref 38–126)
Anion gap: 8 (ref 5–15)
BUN: 91 mg/dL — ABNORMAL HIGH (ref 8–23)
CO2: 35 mmol/L — ABNORMAL HIGH (ref 22–32)
Calcium: 8.9 mg/dL (ref 8.9–10.3)
Chloride: 90 mmol/L — ABNORMAL LOW (ref 98–111)
Creatinine, Ser: 1.62 mg/dL — ABNORMAL HIGH (ref 0.44–1.00)
GFR, Estimated: 31 mL/min — ABNORMAL LOW (ref >60)
Glucose, Bld: 104 mg/dL — ABNORMAL HIGH (ref 70–99)
Potassium: 4.4 mmol/L (ref 3.5–5.1)
Sodium: 133 mmol/L — ABNORMAL LOW (ref 135–145)
Total Bilirubin: 0.4 mg/dL (ref 0.3–1.2)
Total Protein: 6.5 g/dL (ref 6.5–8.1)

## 2021-01-23 LAB — CBC
HCT: 29.6 % — ABNORMAL LOW (ref 36.0–46.0)
Hemoglobin: 8.9 g/dL — ABNORMAL LOW (ref 12.0–15.0)
MCH: 27 pg (ref 26.0–34.0)
MCHC: 30.1 g/dL (ref 30.0–36.0)
MCV: 89.7 fL (ref 80.0–100.0)
Platelets: 180 10*3/uL (ref 150–400)
RBC: 3.3 MIL/uL — ABNORMAL LOW (ref 3.87–5.11)
RDW: 19.5 % — ABNORMAL HIGH (ref 11.5–15.5)
WBC: 7.5 10*3/uL (ref 4.0–10.5)
nRBC: 1.5 % — ABNORMAL HIGH (ref 0.0–0.2)

## 2021-01-23 LAB — PROTIME-INR
INR: 3.1 — ABNORMAL HIGH (ref 0.8–1.2)
Prothrombin Time: 31.8 seconds — ABNORMAL HIGH (ref 11.4–15.2)

## 2021-01-23 LAB — GLUCOSE, CAPILLARY
Glucose-Capillary: 105 mg/dL — ABNORMAL HIGH (ref 70–99)
Glucose-Capillary: 158 mg/dL — ABNORMAL HIGH (ref 70–99)
Glucose-Capillary: 209 mg/dL — ABNORMAL HIGH (ref 70–99)
Glucose-Capillary: 99 mg/dL (ref 70–99)

## 2021-01-23 LAB — PHOSPHORUS: Phosphorus: 3.2 mg/dL (ref 2.5–4.6)

## 2021-01-23 LAB — BRAIN NATRIURETIC PEPTIDE: B Natriuretic Peptide: 519.3 pg/mL — ABNORMAL HIGH (ref 0.0–100.0)

## 2021-01-23 LAB — MAGNESIUM: Magnesium: 1.8 mg/dL (ref 1.7–2.4)

## 2021-01-23 MED ORDER — METOPROLOL SUCCINATE ER 50 MG PO TB24
50.0000 mg | ORAL_TABLET | Freq: Every day | ORAL | Status: DC
  Administered 2021-01-23 – 2021-01-28 (×5): 50 mg via ORAL
  Filled 2021-01-23 (×6): qty 1

## 2021-01-23 NOTE — Progress Notes (Signed)
ANTICOAGULATION CONSULT NOTE - Follow Up Consult  Pharmacy Consult for Warfarin Indication: aortic and mitral mechanical valve replacement  Allergies  Allergen Reactions  . Penicillins Other (See Comments)    Bruise    . Aspirin Other (See Comments)    On coumadin  . Fentanyl Dermatitis    Rash with patch   . Niacin Itching   Patient Measurements: Height: 5\' 7"  (170.2 cm) Weight: 92.8 kg (204 lb 9.4 oz) IBW/kg (Calculated) : 61.6 Heparin Dosing Weight: 81 kg  Vital Signs: Temp: 97.4 F (36.3 C) (05/01 1011) Temp Source: Oral (05/01 1011) BP: 152/57 (05/01 1011) Pulse Rate: 61 (05/01 1011)  Labs: Recent Labs    01/21/21 0513 01/22/21 0439 01/23/21 0615  HGB 8.5* 8.5* 8.9*  HCT 27.9* 27.7* 29.6*  PLT 157 168 180  LABPROT 30.4* 29.2* 31.8*  INR 2.9* 2.8* 3.1*  HEPARINUNFRC 0.27*  --   --   CREATININE 1.93* 1.71* 1.62*   Estimated Creatinine Clearance: 29.2 mL/min (A) (by C-G formula based on SCr of 1.62 mg/dL (H)).  Assessment: 65 yoF admitted on 4/26 with acute respiratory failure.  PMH significant for CHF, COPD, complete heart block s/p defib pacemaker, CKD-IV, mechanical aortic valve replacement, mitral valve replacement on chronic warfarin anticoagulation.  INR on admission is 1.6.  Pharmacy consulted to begin IV heparin when INR < 2.5 due to NPO status.  Home warfarin 10mg  daily.  LD on 4/25. Heparin drip stopped 4/29 AM  01/23/2021   INR 3.1 is therapeutic  No bleeding reported  Hgb = 8.9, PLTC 180 - stable   Goal of Therapy:   INR 2.5 - 3.5 Heparin level 0.3-0.7 units/ml Monitor platelets by anticoagulation protocol: Yes   Plan:  Continue home dose of Warfarin 10 mg daily except 5 mg on Mondays & Fridays Daily Protime/INR- may be able to decrease to MWF INRs soon  Eudelia Bunch, Pharm.D 01/23/2021 10:48 AM

## 2021-01-23 NOTE — Progress Notes (Signed)
Upper extremity venous has been completed.   Preliminary results in CV Proc.   Abram Sander 01/23/2021 11:42 AM

## 2021-01-23 NOTE — Progress Notes (Addendum)
DAILY PROGRESS NOTE   Patient Name: [name] [name] Date of Encounter: 01/23/2021 Cardiologist: Sanda Klein, MD   Patient Profile   [name] [name] is a 87 y.o. 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 Tyrell Antonio.  Subjective   SOB improving. She put out 2.5L yesterday and is net neg 6.7L since admit.  Weight up 4lbs today despite diuresis ? accuracy  Objective   Vitals:   01/22/21 2016 01/23/21 0416 01/23/21 0423 01/23/21 0741  BP: (!) 154/62  130/61   Pulse: 60  (!) 59   Resp: 20  (!) 24   Temp: 97.6 F (36.4 C)  98.2 F (36.8 C)   TempSrc: Oral  Oral   SpO2: 98%  98% 98%  Weight:  92.8 kg    Height:        Intake/Output Summary (Last 24 hours) at 01/23/2021 0829 Last data filed at 01/23/2021 0417 Gross per 24 hour  Intake 480 ml  Output 2500 ml  Net -2020 ml   Filed Weights   01/21/21 0500 01/22/21 0500 01/23/21 0416  Weight: 91.5 kg 90.9 kg 92.8 kg    Physical Exam  GEN: Well nourished, well developed in no acute distress HEENT: Normal NECK: No JVD; No carotid bruits LYMPHATICS: No lymphadenopathy CARDIAC:RRR, no murmurs, rubs, gallops RESPIRATORY:  Clear to auscultation without rales, wheezing or rhonchi  ABDOMEN: Soft, non-tender, non-distended MUSCULOSKELETAL:  No edema; No deformity  SKIN: Warm and dry NEUROLOGIC:  Alert and oriented x 3 PSYCHIATRIC:  Normal affect    Inpatient Medications    Scheduled Meds: . budesonide (PULMICORT) nebulizer solution  0.5 mg Nebulization BID  . chlorhexidine  15 mL Mouth Rinse BID  . Chlorhexidine Gluconate Cloth  6 each Topical Daily  . furosemide  40 mg Intravenous Q12H  . gabapentin  300 mg Oral BID   . hydrALAZINE  10 mg Oral BID  . ipratropium-albuterol  3 mL Nebulization BID  . levETIRAcetam  500 mg Oral BID  . levothyroxine  88 mcg Oral QAC breakfast  . mouth rinse  15 mL Mouth Rinse q12n4p  . metoprolol tartrate  50 mg Oral BID  . predniSONE  40 mg Oral Q breakfast  . warfarin  10 mg Oral Once per day on Sun Tue Wed Thu Sat   And  . [START ON 01/24/2021] warfarin  5 mg Oral Once per day on Mon Fri  . Warfarin - Pharmacist Dosing Inpatient   Does not apply q1600    Continuous Infusions:   PRN Meds: acetaminophen **OR** acetaminophen, hydrALAZINE, ondansetron   Labs   Results for orders placed or performed during the hospital encounter of 01/18/21 (from the past 48 hour(s))  Glucose, capillary     Status: Abnormal   Collection Time: 01/21/21  3:29 PM  Result Value Ref Range   Glucose-Capillary 119 (H) 70 - 99 mg/dL    Comment: Glucose reference range applies only to samples taken after fasting for at least 8 hours.  Glucose, capillary     Status: Abnormal   Collection Time: 01/21/21  5:59 PM  Result Value Ref Range   Glucose-Capillary 189 (H) 70 - 99 mg/dL    Comment: Glucose reference range applies only  to samples taken after fasting for at least 8 hours.  Glucose, capillary     Status: Abnormal   Collection Time: 01/22/21 12:44 AM  Result Value Ref Range   Glucose-Capillary 121 (H) 70 - 99 mg/dL    Comment: Glucose reference range applies only to samples taken after fasting for at least 8 hours.  Protime-INR     Status: Abnormal   Collection Time: 01/22/21  4:39 AM  Result Value Ref Range   Prothrombin Time 29.2 (H) 11.4 - 15.2 seconds   INR 2.8 (H) 0.8 - 1.2    Comment: (NOTE) INR goal varies based on device and disease states. Performed at [facility], [place] [address] [place], [place] [address]   CBC     Status: Abnormal   Collection Time: 01/22/21  4:39 AM  Result Value Ref Range   WBC 7.2 4.0 - 10.5 K/uL   RBC 3.10 (L) 3.87 - 5.11  MIL/uL   Hemoglobin 8.5 (L) 12.0 - 15.0 g/dL   HCT 27.7 (L) 36.0 - 46.0 %   MCV 89.4 80.0 - 100.0 fL   MCH 27.4 26.0 - 34.0 pg   MCHC 30.7 30.0 - 36.0 g/dL   RDW 19.3 (H) 11.5 - 15.5 %   Platelets 168 150 - 400 K/uL   nRBC 1.2 (H) 0.0 - 0.2 %    Comment: Performed at [facility], [place] [address], [place], [place] [address]  Basic metabolic panel     Status: Abnormal   Collection Time: 01/22/21  4:39 AM  Result Value Ref Range   Sodium 141 135 - 145 mmol/L   Potassium 4.8 3.5 - 5.1 mmol/L   Chloride 96 (L) 98 - 111 mmol/L   CO2 35 (H) 22 - 32 mmol/L   Glucose, Bld 117 (H) 70 - 99 mg/dL    Comment: Glucose reference range applies only to samples taken after fasting for at least 8 hours.   BUN 93 (H) 8 - 23 mg/dL   Creatinine, Ser 1.71 (H) 0.44 - 1.00 mg/dL   Calcium 9.4 8.9 - 10.3 mg/dL   GFR, Estimated 29 (L) >60 mL/min    Comment: (NOTE) Calculated using the CKD-EPI Creatinine Equation (2021)    Anion gap 10 5 - 15    Comment: Performed at [facility], [place] [address] [place], [name] [address]  Glucose, capillary     Status: Abnormal   Collection Time: 01/22/21  6:07 AM  Result Value Ref Range   Glucose-Capillary 100 (H) 70 - 99 mg/dL    Comment: Glucose reference range applies only to samples taken after fasting for at least 8 hours.  Glucose, capillary     Status: Abnormal   Collection Time: 01/22/21 11:44 AM  Result Value Ref Range   Glucose-Capillary 142 (H) 70 - 99 mg/dL    Comment: Glucose reference range applies only to samples taken after fasting for at least 8 hours.  Glucose, capillary     Status: Abnormal   Collection Time: 01/22/21  5:00 PM  Result Value Ref Range   Glucose-Capillary 131 (H) 70 - 99 mg/dL    Comment: Glucose reference range applies only to samples taken after fasting for at least 8 hours.  Glucose, capillary     Status: Abnormal   Collection Time: 01/23/21 12:00 AM  Result Value Ref Range    Glucose-Capillary 209 (H) 70 - 99 mg/dL    Comment: Glucose reference range applies only to samples  taken after fasting for at least 8 hours.  Glucose, capillary     Status: Abnormal   Collection Time: 01/23/21  6:13 AM  Result Value Ref Range   Glucose-Capillary 105 (H) 70 - 99 mg/dL    Comment: Glucose reference range applies only to samples taken after fasting for at least 8 hours.  Protime-INR     Status: Abnormal   Collection Time: 01/23/21  6:15 AM  Result Value Ref Range   Prothrombin Time 31.8 (H) 11.4 - 15.2 seconds   INR 3.1 (H) 0.8 - 1.2    Comment: (NOTE) INR goal varies based on device and disease states. Performed at [facility], [place] [address] [place], [place] [address]   CBC     Status: Abnormal   Collection Time: 01/23/21  6:15 AM  Result Value Ref Range   WBC 7.5 4.0 - 10.5 K/uL   RBC 3.30 (L) 3.87 - 5.11 MIL/uL   Hemoglobin 8.9 (L) 12.0 - 15.0 g/dL   HCT 29.6 (L) 36.0 - 46.0 %   MCV 89.7 80.0 - 100.0 fL   MCH 27.0 26.0 - 34.0 pg   MCHC 30.1 30.0 - 36.0 g/dL   RDW 19.5 (H) 11.5 - 15.5 %   Platelets 180 150 - 400 K/uL   nRBC 1.5 (H) 0.0 - 0.2 %    Comment: Performed at [facility], [place] [address], [place], [place] [address]  Comprehensive metabolic panel     Status: Abnormal   Collection Time: 01/23/21  6:15 AM  Result Value Ref Range   Sodium 133 (L) 135 - 145 mmol/L    Comment: DELTA CHECK NOTED   Potassium 4.4 3.5 - 5.1 mmol/L   Chloride 90 (L) 98 - 111 mmol/L   CO2 35 (H) 22 - 32 mmol/L   Glucose, Bld 104 (H) 70 - 99 mg/dL    Comment: Glucose reference range applies only to samples taken after fasting for at least 8 hours.   BUN 91 (H) 8 - 23 mg/dL   Creatinine, Ser 1.62 (H) 0.44 - 1.00 mg/dL   Calcium 8.9 8.9 - 10.3 mg/dL   Total Protein 6.5 6.5 - 8.1 g/dL   Albumin 3.3 (L) 3.5 - 5.0 g/dL   AST 34 15 - 41 U/L   ALT 29 0 - 44 U/L   Alkaline Phosphatase 39 38 - 126 U/L   Total Bilirubin 0.4 0.3 - 1.2 mg/dL    GFR, Estimated 31 (L) >60 mL/min    Comment: (NOTE) Calculated using the CKD-EPI Creatinine Equation (2021)    Anion gap 8 5 - 15    Comment: Performed at [facility], [place] [address], [place], [place] [address]  Magnesium     Status: None   Collection Time: 01/23/21  6:15 AM  Result Value Ref Range   Magnesium 1.8 1.7 - 2.4 mg/dL    Comment: Performed at [facility], [place] [address], [name], [place] [address]  Phosphorus     Status: None   Collection Time: 01/23/21  6:15 AM  Result Value Ref Range   Phosphorus 3.2 2.5 - 4.6 mg/dL    Comment: Performed at [facility], [place] [address] [place], [place] [address]  Brain natriuretic peptide     Status: Abnormal   Collection Time: 01/23/21  6:15 AM  Result Value Ref Range   B Natriuretic Peptide 519.3 (H) 0.0 - 100.0 pg/mL    Comment: Performed at  [facility], [place] [address], [place], [place] [address]   *Note: Due to a large number of results and/or encounters for the requested time period, some results have not been displayed. A complete set of results can be found in Results Review.    ECG   N/A  Telemetry   Paced rhythm with PVCs- Personally Reviewed  Radiology    ECHOCARDIOGRAM COMPLETE  Result Date: 01/21/2021    ECHOCARDIOGRAM REPORT   Patient Name:   Leonna Revoir Date of Exam: 01/21/2021 Medical Rec #:  [id number]      Height:       67.0 in Accession #:    [id number]     Weight:       201.7 lb Date of Birth:  02/01/1932      BSA:          2.030 m Patient Age:    41 years       BP:           148/103 mmHg Patient Gender: F              HR:           65 bpm. Exam Location:  Inpatient Procedure: 2D Echo Indications:    Congestive Heart Failure I50.9  History:        Patient has prior history of Echocardiogram examinations, most                 recent 01/31/2020. Defibrillator; Risk Factors:Dyslipidemia and                 Diabetes.                  Aortic Valve: mechanical valve is present in the aortic                 position. Procedure Date: 2001.                 Mitral Valve: mechanical valve valve is present in the mitral                 position. Procedure Date: 2004.  Sonographer:    Mikki Santee RDCS (AE) Referring Phys: [id number] BELKYS A REGALADO IMPRESSIONS  1. Left ventricular ejection fraction, by estimation, is 25 to 30%. The left ventricle has severely decreased function. The left ventricle demonstrates global hypokinesis. The left ventricular internal cavity size was moderately dilated. There is mild concentric left ventricular hypertrophy. Left ventricular diastolic parameters are indeterminate.  2. Right ventricular systolic function is normal. The right ventricular size is normal. There is severely elevated pulmonary artery systolic pressure.  3. Left atrial size was severely dilated.  4. Right atrial size was severely dilated.  5. A small pericardial effusion is present. The pericardial effusion is posterior to the left ventricle.  6. Mitral valve gradient essentially unchanged form 01/2020. The mitral valve has been repaired/replaced. No evidence of mitral valve regurgitation. No evidence of mitral stenosis. The mean mitral valve gradient is 6.0 mmHg. There is a mechanical valve present in the mitral position. Procedure Date: 2004.  7. Tricuspid valve regurgitation is mild to moderate.  8. The aortic valve has been repaired/replaced. Aortic valve regurgitation is not visualized. No aortic stenosis is present. There is a mechanical valve present in the aortic position. Procedure Date: 2001. Echo findings are consistent with normal structure and function of the aortic valve prosthesis. Aortic valve area, by VTI measures 1.32 cm. Aortic valve mean gradient  measures 7.0 mmHg. Aortic valve Vmax measures 2.00 m/s.  9. Aortic dilatation noted. There is mild dilatation of the ascending aorta, measuring 38 mm. 10. The inferior vena cava is dilated  in size with <50% respiratory variability, suggesting right atrial pressure of 15 mmHg. FINDINGS  Left Ventricle: Left ventricular ejection fraction, by estimation, is 25 to 30%. The left ventricle has severely decreased function. The left ventricle demonstrates global hypokinesis. The left ventricular internal cavity size was moderately dilated. There is mild concentric left ventricular hypertrophy. Left ventricular diastolic parameters are indeterminate. Right Ventricle: The right ventricular size is normal. No increase in right ventricular wall thickness. Right ventricular systolic function is normal. There is severely elevated pulmonary artery systolic pressure. The tricuspid regurgitant velocity is 3.89 m/s, and with an assumed right atrial pressure of 15 mmHg, the estimated right ventricular systolic pressure is 89.3 mmHg. Left Atrium: Left atrial size was severely dilated. Right Atrium: Right atrial size was severely dilated. Pericardium: A small pericardial effusion is present. The pericardial effusion is posterior to the left ventricle. Mitral Valve: Mitral valve gradient essentially unchanged form 01/2020. The mitral valve has been repaired/replaced. No evidence of mitral valve regurgitation. There is a mechanical valve present in the mitral position. Procedure Date: 2004. No evidence of mitral valve stenosis. MV peak gradient, 15.8 mmHg. The mean mitral valve gradient is 6.0 mmHg with average heart rate of 59 bpm. Tricuspid Valve: The tricuspid valve is normal in structure. Tricuspid valve regurgitation is mild to moderate. No evidence of tricuspid stenosis. Aortic Valve: The aortic valve has been repaired/replaced. Aortic valve regurgitation is not visualized. No aortic stenosis is present. Aortic valve mean gradient measures 7.0 mmHg. Aortic valve peak gradient measures 16.0 mmHg. Aortic valve area, by VTI  measures 1.32 cm. There is a mechanical valve present in the aortic position. Procedure Date:  2001. Echo findings are consistent with normal structure and function of the aortic valve prosthesis. Pulmonic Valve: The pulmonic valve was normal in structure. Pulmonic valve regurgitation is mild. No evidence of pulmonic stenosis. Aorta: Aortic dilatation noted. There is mild dilatation of the ascending aorta, measuring 38 mm. Venous: The inferior vena cava is dilated in size with less than 50% respiratory variability, suggesting right atrial pressure of 15 mmHg. IAS/Shunts: No atrial level shunt detected by color flow Doppler. Additional Comments: A venous catheter is visualized.  LEFT VENTRICLE PLAX 2D LVIDd:         6.35 cm  Diastology LVIDs:         5.49 cm  LV e' medial:    5.78 cm/s LV PW:         0.96 cm  LV E/e' medial:  33.2 LV IVS:        1.10 cm  LV e' lateral:   5.61 cm/s LVOT diam:     2.20 cm  LV E/e' lateral: 34.2 LV SV:         55 LV SV Index:   27 LVOT Area:     3.80 cm  RIGHT VENTRICLE TAPSE (M-mode): 1.2 cm LEFT ATRIUM            Index       RIGHT ATRIUM           Index LA diam:      4.70 cm  2.32 cm/m  RA Area:     33.90 cm LA Vol (A2C): 78.8 ml  38.82 ml/m RA Volume:   140.00 ml 68.98 ml/m LA Vol (A4C): 103.0  ml 50.75 ml/m  AORTIC VALVE AV Area (Vmax):    1.85 cm AV Area (Vmean):   1.71 cm AV Area (VTI):     1.32 cm AV Vmax:           200.00 cm/s AV Vmean:          124.000 cm/s AV VTI:            0.419 m AV Peak Grad:      16.0 mmHg AV Mean Grad:      7.0 mmHg LVOT Vmax:         97.50 cm/s LVOT Vmean:        55.900 cm/s LVOT VTI:          0.145 m LVOT/AV VTI ratio: 0.35  AORTA Ao Root diam: 3.10 cm MITRAL VALVE                TRICUSPID VALVE MV Area (PHT): 3.17 cm     TR Peak grad:   60.5 mmHg MV Area VTI:   1.17 cm     TR Vmax:        389.00 cm/s MV Peak grad:  15.8 mmHg MV Mean grad:  6.0 mmHg     SHUNTS MV Vmax:       1.99 m/s     Systemic VTI:  0.14 m MV Vmean:      118.0 cm/s   Systemic Diam: 2.20 cm MV Decel Time: 239 msec MV E velocity: 192.00 cm/s MV A velocity: 66.10 cm/s  MV E/A ratio:  2.90 Skeet Latch MD Electronically signed by Skeet Latch MD Signature Date/Time: 01/21/2021/11:43:07 AM    Final     Cardiac Studies   N/A  Plan   Acute on chronic combined systolic and diastolic heart failure -She does not have prior h/o CAD, reportedly had a cath in [place] prior to AVR in 2001 that showed normal coronary arteries. -She is not a good cath candidate given renal dysfunction, recommend medical therapy with plan to repeat echo later.  -Initial chest x-ray showed vascular congestion.  -last cardiology note mentioned she was on 80 mg of Lasix +2.5 mg daily of metolazone, however patient did not remember taking the metolazone recently.  Based on her med list, it appears she had been on 80 mg twice daily of Lasix with no metolazone. -She has been placed on 40 mg twice daily of IV Lasix  -She put out 2.5L yesterday and is net neg 6.7L since admit.   -weights are inaccurate -She says her breathing is approaching baseline.    -SCr improving with diuresis (2.27>1.93>1.71>1.62) -BNP this am remains elevated a 519 but this may be where she stays -continue Hydralazine 10mg  BID -change Lopressor 50mg  BID to Toprol XL 50mg  daily and titrate to 100mg  daily if HR allows -will continue to diurese with IV diuretics as long as SCr continues to decline  Persistent atrial fibrillation:  -Vpaced with PVCs on tele but cannot tell if she is in NSR -repeat 12 lead EKG -continue on 50mg  BID metoprolol tartrate.  -On Coumadin for mechanical valve  Complete heart block  -s/p Medtronic BiV pacemaker 01/30/2019  Mechanical aortic and mitral valve replacement:  -On Coumadin.   -INR initially subtherapeutic -INR today therapeutic at 3.1  I have spent a total of 35 minutes with patient reviewing  telemetry, EKGs, labs and examining patient as well as establishing an assessment and plan that was discussed with the patient.  > 50% of time  was spent in direct patient care.     Kindell Strada 01/23/2021, 8:29 AM

## 2021-01-23 NOTE — Progress Notes (Signed)
PROGRESS NOTE    [name] [name]  WFU:[id number] DOB: [birth year] DOA: 01/18/2021 PCP: Fayrene Helper, MD   Brief Narrative: This 87 years old female with PMH significant for COPD, chronic combined systolic and diastolic heart failure, complete heart block s/p [employer] pacemaker, AICD, A. fib, history of mechanical aortic valve on Coumadin, chronic kidney disease stage IIIb, hypothyroidism, seizure disorder presented in the ED with progressive shortness of breath.  Patient has received a dose of Retacrit prior to coming to the hospital. Patient was found to be lethargic on arrival in the ED with ABG showing pH of 7.2 , PCO2 of 80.  chest x-ray shows pulmonary congestion.  Patient was started on BiPAP, nebulization and IV Lasix.  Patient improved on BiPAP and for treatment for heart failure and COPD exacerbation. Patient is admitted for acute hypoxic and hypercapnic respiratory failure secondary to COPD and CHF exacerbation.  Assessment & Plan:   Principal Problem:   Acute respiratory failure with hypoxia and hypercapnia (HCC) Active Problems:   Hx of aortic valve replacement, mechanical   S/P MVR (mitral valve replacement)   Chronic anticoagulation, on coumadin for Mechanical AVR and MVR   S/P ICD (internal cardiac defibrillator) procedure   Persistent atrial fibrillation (HCC)   COPD with acute exacerbation (HCC)   Type 2 diabetes mellitus with hypoglycemia without coma (HCC)   Acute systolic CHF (congestive heart failure) (HCC)   Stage 3b chronic kidney disease (HCC)   Seizure (HCC)   Acute respiratory failure with hypoxia (HCC)  Acute hypoxic and hypercapnic respiratory failure in the setting of COPD and heart failure exacerbation. Patient presented with hypoxic and hypercapnic respiratory failure. She was lethargic on arrival with a pH of 7.2,  PCO2 of 80. Patient was initially placed on BiPAP and she improved. She is off BiPAP now. Continue Bipap as needed. Continued with IV  Lasix, Solu-Medrol and nebulization. She is 2.5 L negative balance in the last 24 hours. So far she is 6.7 L negative since admission. Continue IV Lasix 40 mg IV twice daily for today. Solu-Medrol changed to prednisone taper. Continue supplemental oxygen to keep saturation above 94%. Continue incentive spirometry  Acute on chronic combined systolic and diastolic heart failure: Continue with IV Lasix twice daily, monitor intake and output. Urine output 2.5 L in last 24 hours. Echo shows LVEF 25 to 30%. Cardiology recommended to continue iv diuresis.  COPD exacerbation: Continue with nebulizer, Pulmicort. Continue supplemental oxygen.  AKI on CKD stage IIIb: Serum creatinine improving and almost back to the baseline.  Acute metabolic encephalopathy: > Resolved Patient was lethargic on arrival secondary to PCO2 retention which has improved with BiPAP. Patient is alert and oriented, back to her baseline mental status, she is fully involved in conversation  History of mechanical aortic valve and mitral valve replacement on Coumadin INR therapeutic,  continue with Coumadin.  Hypothyroidism: Continue Synthroid.  History of seizure disorder: Continue Keppra  Hypokalemia: Resolved   Anemia of chronic disease:   monitor H&H. Hb stable 8.5  . Pressure Injury 07/08/20 Buttocks Right Stage 2 -  Partial thickness loss of dermis presenting as a shallow open injury with a red, pink wound bed without slough. 2 cm x 2cm (Active)  07/08/20 0731  Location: Buttocks  Location Orientation: Right  Staging: Stage 2 -  Partial thickness loss of dermis presenting as a shallow open injury with a red, pink wound bed without slough.  Wound Description (Comments): 2 cm x 2cm  Present on Admission: No  DVT prophylaxis: Heparin Code Status:  Full code. Family Communication: No family at bed side. Disposition Plan:   Status is: Inpatient  Remains inpatient appropriate because:Inpatient  level of care appropriate due to severity of illness   Dispo: The patient is from: Home              Anticipated d/c is to: SNF in 1-2 days              Patient currently is not medically stable to d/c.   Difficult to place patient No  Consultants:    Cardiology  Procedures:  Antimicrobials:  Anti-infectives (From admission, onward)   Start     Dose/Rate Route Frequency Ordered Stop   01/20/21 1600  azithromycin (ZITHROMAX) tablet 500 mg        500 mg Oral Daily 01/20/21 0850 01/22/21 0933   01/18/21 2200  azithromycin (ZITHROMAX) 500 mg in sodium chloride 0.9 % 250 mL IVPB  Status:  Discontinued        500 mg 250 mL/hr over 60 Minutes Intravenous Every 24 hours 01/18/21 2136 01/20/21 0850      Subjective: Patient was seen and examined at bedside.  Overnight events noted.  Patient reports feeling much improved. She reports leg swelling has improved, breathing has improved. She reports left arm swelling at iv site, developed hard swelling.  Objective: Vitals:   01/23/21 0423 01/23/21 0741 01/23/21 1006 01/23/21 1011  BP: 130/61  (!) 152/57 (!) 152/57  Pulse: (!) 59  60 61  Resp: (!) 24   16  Temp: 98.2 F (36.8 C)   (!) 97.4 F (36.3 C)  TempSrc: Oral   Oral  SpO2: 98% 98%  99%  Weight:      Height:        Intake/Output Summary (Last 24 hours) at 01/23/2021 1143 Last data filed at 01/23/2021 0900 Gross per 24 hour  Intake 360 ml  Output 2200 ml  Net -1840 ml   Filed Weights   01/21/21 0500 01/22/21 0500 01/23/21 0416  Weight: 91.5 kg 90.9 kg 92.8 kg    Examination:  General exam: Appears calm and comfortable, not in any acute distress.  Respiratory system: Clear to auscultation. Respiratory effort normal. Cardiovascular system: S1 & S2 heard, RRR. No JVD, murmurs, rubs, gallops or clicks. No pedal edema. Gastrointestinal system: Abdomen is nondistended, soft and nontender. No organomegaly or masses felt. Normal bowel sounds heard. Central nervous system: Alert  and oriented. No focal neurological deficits. Extremities: No edema, no cyanosis, no clubbing. Left arm swelling at IV site, tender, erythematous and warm Skin: No rashes, lesions or ulcers Psychiatry: Judgement and insight appear normal. Mood & affect appropriate.     Data Reviewed: I have personally reviewed following labs and imaging studies  CBC: Recent Labs  Lab 01/18/21 1114 01/18/21 1656 01/18/21 1945 01/19/21 1821 01/20/21 0238 01/21/21 0513 01/22/21 0439 01/23/21 0615  WBC 5.1 5.4   < > 4.1 6.0 7.5 7.2 7.5  NEUTROABS 3.7 4.3  --   --   --   --   --   --   HGB 10.5* 10.7*   < > 9.9* 8.5* 8.5* 8.5* 8.9*  HCT 35.9* 36.4   < > 33.5* 27.8* 27.9* 27.7* 29.6*  MCV 93.0 92.6   < > 92.0 90.0 89.7 89.4 89.7  PLT 166 193   < > 169 159 157 168 180   < > = values in this interval not displayed.   Basic Metabolic Panel:  Recent Labs  Lab 01/18/21 1142 01/18/21 1930 01/19/21 0718 01/20/21 0238 01/21/21 0513 01/22/21 0439 01/23/21 0615  NA 139   < > 145 141 139 141 133*  K 4.6   < > 5.5* 4.5 4.4 4.8 4.4  CL 99   < > 102 102 97* 96* 90*  CO2 29   < > 34* 30 31 35* 35*  GLUCOSE 104*   < > 147* 119* 105* 117* 104*  BUN 87*   < > 96* 93* 91* 93* 91*  CREATININE 2.84*   < > 2.73* 2.27* 1.93* 1.71* 1.62*  CALCIUM 9.5   < > 9.1 8.9 8.9 9.4 8.9  MG  --   --   --   --   --   --  1.8  PHOS 4.6  --   --   --   --   --  3.2   < > = values in this interval not displayed.   GFR: Estimated Creatinine Clearance: 29.2 mL/min (A) (by C-G formula based on SCr of 1.62 mg/dL (H)). Liver Function Tests: Recent Labs  Lab 01/18/21 1142 01/18/21 1930 01/19/21 0718 01/23/21 0615  AST  --  32 41 34  ALT  --  20 25 29   ALKPHOS  --  38 46 39  BILITOT  --  0.4 0.4 0.4  PROT  --  5.7* 7.7 6.5  ALBUMIN 3.9 2.8* 3.7 3.3*   No results for input(s): LIPASE, AMYLASE in the last 168 hours. Recent Labs  Lab 01/19/21 0718  AMMONIA 19   Coagulation Profile: Recent Labs  Lab 01/19/21 0718  01/20/21 0238 01/21/21 0513 01/22/21 0439 01/23/21 0615  INR 1.8* 2.4* 2.9* 2.8* 3.1*   Cardiac Enzymes: No results for input(s): CKTOTAL, CKMB, CKMBINDEX, TROPONINI in the last 168 hours. BNP (last 3 results) No results for input(s): PROBNP in the last 8760 hours. HbA1C: No results for input(s): HGBA1C in the last 72 hours. CBG: Recent Labs  Lab 01/22/21 1144 01/22/21 1700 01/23/21 0000 01/23/21 0613 01/23/21 1120  GLUCAP 142* 131* 209* 105* 158*   Lipid Profile: No results for input(s): CHOL, HDL, LDLCALC, TRIG, CHOLHDL, LDLDIRECT in the last 72 hours. Thyroid Function Tests: No results for input(s): TSH, T4TOTAL, FREET4, T3FREE, THYROIDAB in the last 72 hours. Anemia Panel: No results for input(s): VITAMINB12, FOLATE, FERRITIN, TIBC, IRON, RETICCTPCT in the last 72 hours. Sepsis Labs: No results for input(s): PROCALCITON, LATICACIDVEN in the last 168 hours.  Recent Results (from the past 240 hour(s))  Resp Panel by RT-PCR (Flu A&B, Covid) Nasopharyngeal Swab     Status: None   Collection Time: 01/18/21  5:08 PM   Specimen: Nasopharyngeal Swab; Nasopharyngeal(NP) swabs in vial transport medium  Result Value Ref Range Status   SARS Coronavirus 2 by RT PCR NEGATIVE NEGATIVE Final    Comment: (NOTE) SARS-CoV-2 target nucleic acids are NOT DETECTED.  The SARS-CoV-2 RNA is generally detectable in upper respiratory specimens during the acute phase of infection. The lowest concentration of SARS-CoV-2 viral copies this assay can detect is 138 copies/mL. A negative result does not preclude SARS-Cov-2 infection and should not be used as the sole basis for treatment or other patient management decisions. A negative result may occur with  improper specimen collection/handling, submission of specimen other than nasopharyngeal swab, presence of viral mutation(s) within the areas targeted by this assay, and inadequate number of viral copies(<138 copies/mL). A negative result must  be combined with clinical observations, patient history, and epidemiological information.  The expected result is Negative.  Fact Sheet for Patients:  EntrepreneurPulse.com.au  Fact Sheet for Healthcare Providers:  IncredibleEmployment.be  This test is no t yet approved or cleared by the [place] FDA and  has been authorized for detection and/or diagnosis of SARS-CoV-2 by FDA under an Emergency Use Authorization (EUA). This EUA will remain  in effect (meaning this test can be used) for the duration of the COVID-19 declaration under Section 564(b)(1) of the Act, 21 U.S.C.section 360bbb-3(b)(1), unless the authorization is terminated  or revoked sooner.       Influenza A by PCR NEGATIVE NEGATIVE Final   Influenza B by PCR NEGATIVE NEGATIVE Final    Comment: (NOTE) The Xpert Xpress SARS-CoV-2/FLU/RSV plus assay is intended as an aid in the diagnosis of influenza from Nasopharyngeal swab specimens and should not be used as a sole basis for treatment. Nasal washings and aspirates are unacceptable for Xpert Xpress SARS-CoV-2/FLU/RSV testing.  Fact Sheet for Patients: EntrepreneurPulse.com.au  Fact Sheet for Healthcare Providers: IncredibleEmployment.be  This test is not yet approved or cleared by the [place] FDA and has been authorized for detection and/or diagnosis of SARS-CoV-2 by FDA under an Emergency Use Authorization (EUA). This EUA will remain in effect (meaning this test can be used) for the duration of the COVID-19 declaration under Section 564(b)(1) of the Act, 21 U.S.C. section 360bbb-3(b)(1), unless the authorization is terminated or revoked.  Performed at [facility], [place] [address] [place], [place] [address]   MRSA PCR Screening     Status: None   Collection Time: 01/18/21  9:58 PM   Specimen: Nasal Mucosa; Nasopharyngeal  Result Value Ref Range Status   MRSA  by PCR NEGATIVE NEGATIVE Final    Comment:        The GeneXpert MRSA Assay (FDA approved for NASAL specimens only), is one component of a comprehensive MRSA colonization surveillance program. It is not intended to diagnose MRSA infection nor to guide or monitor treatment for MRSA infections. Performed at [facility], [place] [address] [place], [place] [address]     Radiology Studies: VAS [place] UPPER EXTREMITY VENOUS DUPLEX  Result Date: 01/23/2021 UPPER VENOUS STUDY  Patient Name:  Faustina Kildow  Date of Exam:   01/23/2021 Medical Rec #: [id number]       Accession #:    [id number] Date of Birth: 09-07-1932       Patient Gender: F Patient Age:   88Y Exam Location:  [facility] Procedure:      VAS [place] UPPER EXTREMITY VENOUS DUPLEX Referring Phys: [id number] Derron Pipkins --------------------------------------------------------------------------------  Indications: Pain, and Swelling Comparison Study: no prior Performing Technologist: Abram Sander RVS  Examination Guidelines: A complete evaluation includes B-mode imaging, spectral Doppler, color Doppler, and power Doppler as needed of all accessible portions of each vessel. Bilateral testing is considered an integral part of a complete examination. Limited examinations for reoccurring indications may be performed as noted.  Left Findings: +----------+------------+---------+-----------+----------+-------+ LEFT      CompressiblePhasicitySpontaneousPropertiesSummary +----------+------------+---------+-----------+----------+-------+ IJV           Full       Yes       Yes                      +----------+------------+---------+-----------+----------+-------+ Subclavian    Full       Yes       Yes                      +----------+------------+---------+-----------+----------+-------+  Axillary      Full       Yes       Yes                      +----------+------------+---------+-----------+----------+-------+  Brachial      Full       Yes       Yes                      +----------+------------+---------+-----------+----------+-------+ Radial        Full                                          +----------+------------+---------+-----------+----------+-------+ Ulnar         Full                                          +----------+------------+---------+-----------+----------+-------+ Cephalic      Full                                          +----------+------------+---------+-----------+----------+-------+ Basilic       Full                                          +----------+------------+---------+-----------+----------+-------+  Summary:  Left: No evidence of deep vein thrombosis in the upper extremity. No evidence of superficial vein thrombosis in the upper extremity. No evidence of thrombosis in the subclavian. Nonvascularized area of mixed echos measuring 3.57cm x 2.50cm in the proximal forearm. Etiology unknow.  *See table(s) above for measurements and observations.    Preliminary     Scheduled Meds: . budesonide (PULMICORT) nebulizer solution  0.5 mg Nebulization BID  . chlorhexidine  15 mL Mouth Rinse BID  . Chlorhexidine Gluconate Cloth  6 each Topical Daily  . furosemide  40 mg Intravenous Q12H  . gabapentin  300 mg Oral BID  . hydrALAZINE  10 mg Oral BID  . ipratropium-albuterol  3 mL Nebulization BID  . levETIRAcetam  500 mg Oral BID  . levothyroxine  88 mcg Oral QAC breakfast  . mouth rinse  15 mL Mouth Rinse q12n4p  . metoprolol succinate  50 mg Oral Daily  . warfarin  10 mg Oral Once per day on Sun Tue Wed Thu Sat   And  . [START ON 01/24/2021] warfarin  5 mg Oral Once per day on Mon Fri  . Warfarin - Pharmacist Dosing Inpatient   Does not apply q1600   Continuous Infusions:   LOS: 5 days    Time spent: 25 mins    Illyria Sobocinski, MD [facility]   If 7PM-7AM, please contact night-coverage

## 2021-01-23 NOTE — Progress Notes (Signed)
Pt had an IV infiltrate three days ago in her left forearm. Pt has had c/o pain and discomfort. RN noticed that pts left forearm swelling has not decreased and the site is discolored with hardness. MD aware and placed order for an ultrasound to check for possible DVT.

## 2021-01-24 ENCOUNTER — Encounter: Payer: Medicare Other | Admitting: Cardiovascular Disease

## 2021-01-24 DIAGNOSIS — N179 Acute kidney failure, unspecified: Secondary | ICD-10-CM | POA: Diagnosis not present

## 2021-01-24 DIAGNOSIS — N184 Chronic kidney disease, stage 4 (severe): Secondary | ICD-10-CM

## 2021-01-24 DIAGNOSIS — J9601 Acute respiratory failure with hypoxia: Secondary | ICD-10-CM | POA: Diagnosis not present

## 2021-01-24 DIAGNOSIS — I5021 Acute systolic (congestive) heart failure: Secondary | ICD-10-CM | POA: Diagnosis not present

## 2021-01-24 DIAGNOSIS — Z7901 Long term (current) use of anticoagulants: Secondary | ICD-10-CM | POA: Diagnosis not present

## 2021-01-24 DIAGNOSIS — J9602 Acute respiratory failure with hypercapnia: Secondary | ICD-10-CM | POA: Diagnosis not present

## 2021-01-24 LAB — BASIC METABOLIC PANEL
Anion gap: 9 (ref 5–15)
BUN: 88 mg/dL — ABNORMAL HIGH (ref 8–23)
CO2: 37 mmol/L — ABNORMAL HIGH (ref 22–32)
Calcium: 9.3 mg/dL (ref 8.9–10.3)
Chloride: 92 mmol/L — ABNORMAL LOW (ref 98–111)
Creatinine, Ser: 1.46 mg/dL — ABNORMAL HIGH (ref 0.44–1.00)
GFR, Estimated: 35 mL/min — ABNORMAL LOW (ref >60)
Glucose, Bld: 103 mg/dL — ABNORMAL HIGH (ref 70–99)
Potassium: 4.9 mmol/L (ref 3.5–5.1)
Sodium: 138 mmol/L (ref 135–145)

## 2021-01-24 LAB — GLUCOSE, CAPILLARY
Glucose-Capillary: 103 mg/dL — ABNORMAL HIGH (ref 70–99)
Glucose-Capillary: 127 mg/dL — ABNORMAL HIGH (ref 70–99)
Glucose-Capillary: 130 mg/dL — ABNORMAL HIGH (ref 70–99)
Glucose-Capillary: 132 mg/dL — ABNORMAL HIGH (ref 70–99)

## 2021-01-24 LAB — PHOSPHORUS: Phosphorus: 3.5 mg/dL (ref 2.5–4.6)

## 2021-01-24 LAB — PROTIME-INR
INR: 3 — ABNORMAL HIGH (ref 0.8–1.2)
Prothrombin Time: 31.2 seconds — ABNORMAL HIGH (ref 11.4–15.2)

## 2021-01-24 LAB — MAGNESIUM: Magnesium: 1.9 mg/dL (ref 1.7–2.4)

## 2021-01-24 MED ORDER — ISOSORBIDE MONONITRATE ER 30 MG PO TB24
15.0000 mg | ORAL_TABLET | Freq: Every day | ORAL | Status: DC
  Administered 2021-01-24 – 2021-01-28 (×5): 15 mg via ORAL
  Filled 2021-01-24 (×5): qty 1

## 2021-01-24 MED ORDER — HYDRALAZINE HCL 10 MG PO TABS
10.0000 mg | ORAL_TABLET | Freq: Three times a day (TID) | ORAL | Status: DC
  Administered 2021-01-24 – 2021-01-28 (×11): 10 mg via ORAL
  Filled 2021-01-24 (×12): qty 1

## 2021-01-24 NOTE — Progress Notes (Addendum)
DAILY PROGRESS NOTE   Patient Name: [name] [name] Date of Encounter: 01/24/2021 Cardiologist: [name] Klein, MD   Patient Profile   [name] [name] is a 87 y.o. 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  Subjective   SOB continues to improve. She put out 2.2L yesterday and is net neg 8.6L since admit.   Objective   Vitals:   01/23/21 2021 01/24/21 0446 01/24/21 0500 01/24/21 0932  BP: (!) 155/61 131/61    Pulse: (!) 59 60    Resp:  20    Temp:  98.2 F (36.8 C)    TempSrc:  Oral    SpO2:  100%  100%  Weight:   89 kg   Height:        Intake/Output Summary (Last 24 hours) at 01/24/2021 1123 Last data filed at 01/24/2021 5956 Gross per 24 hour  Intake 480 ml  Output 2175 ml  Net -1695 ml   Filed Weights   01/22/21 0500 01/23/21 0416 01/24/21 0500  Weight: 90.9 kg 92.8 kg 89 kg    Physical Exam  GEN: Well nourished, well developed in no acute distress HEENT: Normal NECK: No JVD; No carotid bruits LYMPHATICS: No lymphadenopathy CARDIAC:RRR, no murmurs, rubs, gallops RESPIRATORY:  Clear to auscultation without rales, wheezing or rhonchi  ABDOMEN: Soft, non-tender, non-distended MUSCULOSKELETAL:  No edema; No deformity  SKIN: Warm and dry NEUROLOGIC:  Alert and oriented x 3 PSYCHIATRIC:  Normal affect    Inpatient Medications    Scheduled Meds: . budesonide (PULMICORT) nebulizer solution  0.5 mg Nebulization BID  . chlorhexidine  15 mL Mouth Rinse BID  . furosemide  40 mg Intravenous Q12H  . gabapentin  300 mg Oral BID  . hydrALAZINE  10 mg Oral BID  . ipratropium-albuterol  3 mL Nebulization BID  . levETIRAcetam  500 mg Oral BID  .  levothyroxine  88 mcg Oral QAC breakfast  . mouth rinse  15 mL Mouth Rinse q12n4p  . metoprolol succinate  50 mg Oral Daily  . warfarin  10 mg Oral Once per day on Sun Tue Wed Thu Sat   And  . warfarin  5 mg Oral Once per day on Mon Fri  . Warfarin - Pharmacist Dosing Inpatient   Does not apply q1600    Continuous Infusions:   PRN Meds: acetaminophen **OR** acetaminophen, hydrALAZINE, ondansetron   Labs   Results for orders placed or performed during the hospital encounter of 01/18/21 (from the past 48 hour(s))  Glucose, capillary     Status: Abnormal   Collection Time: 01/22/21 11:44 AM  Result Value Ref Range   Glucose-Capillary 142 (H) 70 - 99 mg/dL    Comment: Glucose reference range applies only to samples taken after fasting for at least 8 hours.  Glucose, capillary     Status: Abnormal   Collection Time: 01/22/21  5:00 PM  Result Value Ref Range   Glucose-Capillary 131 (H) 70 - 99 mg/dL    Comment: Glucose reference range applies only to samples taken after fasting for at least 8 hours.  Glucose, capillary     Status: Abnormal   Collection Time: 01/23/21 12:00 AM  Result Value Ref Range  Glucose-Capillary 209 (H) 70 - 99 mg/dL    Comment: Glucose reference range applies only to samples taken after fasting for at least 8 hours.  Glucose, capillary     Status: Abnormal   Collection Time: 01/23/21  6:13 AM  Result Value Ref Range   Glucose-Capillary 105 (H) 70 - 99 mg/dL    Comment: Glucose reference range applies only to samples taken after fasting for at least 8 hours.  Protime-INR     Status: Abnormal   Collection Time: 01/23/21  6:15 AM  Result Value Ref Range   Prothrombin Time 31.8 (H) 11.4 - 15.2 seconds   INR 3.1 (H) 0.8 - 1.2    Comment: (NOTE) INR goal varies based on device and disease states. Performed at [facility], [place] [address] [place], [place] [address]   CBC     Status: Abnormal   Collection Time: 01/23/21  6:15 AM  Result  Value Ref Range   WBC 7.5 4.0 - 10.5 K/uL   RBC 3.30 (L) 3.87 - 5.11 MIL/uL   Hemoglobin 8.9 (L) 12.0 - 15.0 g/dL   HCT 29.6 (L) 36.0 - 46.0 %   MCV 89.7 80.0 - 100.0 fL   MCH 27.0 26.0 - 34.0 pg   MCHC 30.1 30.0 - 36.0 g/dL   RDW 19.5 (H) 11.5 - 15.5 %   Platelets 180 150 - 400 K/uL   nRBC 1.5 (H) 0.0 - 0.2 %    Comment: Performed at [facility], [place] [address] [place], [place] [address]  Comprehensive metabolic panel     Status: Abnormal   Collection Time: 01/23/21  6:15 AM  Result Value Ref Range   Sodium 133 (L) 135 - 145 mmol/L    Comment: DELTA CHECK NOTED   Potassium 4.4 3.5 - 5.1 mmol/L   Chloride 90 (L) 98 - 111 mmol/L   CO2 35 (H) 22 - 32 mmol/L   Glucose, Bld 104 (H) 70 - 99 mg/dL    Comment: Glucose reference range applies only to samples taken after fasting for at least 8 hours.   BUN 91 (H) 8 - 23 mg/dL   Creatinine, Ser 1.62 (H) 0.44 - 1.00 mg/dL   Calcium 8.9 8.9 - 10.3 mg/dL   Total Protein 6.5 6.5 - 8.1 g/dL   Albumin 3.3 (L) 3.5 - 5.0 g/dL   AST 34 15 - 41 U/L   ALT 29 0 - 44 U/L   Alkaline Phosphatase 39 38 - 126 U/L   Total Bilirubin 0.4 0.3 - 1.2 mg/dL   GFR, Estimated 31 (L) >60 mL/min    Comment: (NOTE) Calculated using the CKD-EPI Creatinine Equation (2021)    Anion gap 8 5 - 15    Comment: Performed at [facility], [place] [address], [place], [place] [address]  Magnesium     Status: None   Collection Time: 01/23/21  6:15 AM  Result Value Ref Range   Magnesium 1.8 1.7 - 2.4 mg/dL    Comment: Performed at [facility], [place] [address] [place], [place] [address]  Phosphorus     Status: None   Collection Time: 01/23/21  6:15 AM  Result Value Ref Range   Phosphorus 3.2 2.5 - 4.6 mg/dL    Comment: Performed at [facility], [place] [address], [place],  [address]  Brain natriuretic peptide     Status: Abnormal   Collection Time: 01/23/21  6:15 AM  Result Value Ref  Range    B Natriuretic Peptide 519.3 (H) 0.0 - 100.0 pg/mL    Comment: Performed at [facility], [place] [address] [place], [place] [address]  Glucose, capillary     Status: Abnormal   Collection Time: 01/23/21 11:20 AM  Result Value Ref Range   Glucose-Capillary 158 (H) 70 - 99 mg/dL    Comment: Glucose reference range applies only to samples taken after fasting for at least 8 hours.  Glucose, capillary     Status: None   Collection Time: 01/23/21  4:40 PM  Result Value Ref Range   Glucose-Capillary 99 70 - 99 mg/dL    Comment: Glucose reference range applies only to samples taken after fasting for at least 8 hours.  Glucose, capillary     Status: Abnormal   Collection Time: 01/24/21 12:25 AM  Result Value Ref Range   Glucose-Capillary 127 (H) 70 - 99 mg/dL    Comment: Glucose reference range applies only to samples taken after fasting for at least 8 hours.  Glucose, capillary     Status: Abnormal   Collection Time: 01/24/21  4:48 AM  Result Value Ref Range   Glucose-Capillary 103 (H) 70 - 99 mg/dL    Comment: Glucose reference range applies only to samples taken after fasting for at least 8 hours.  Protime-INR     Status: Abnormal   Collection Time: 01/24/21  5:09 AM  Result Value Ref Range   Prothrombin Time 31.2 (H) 11.4 - 15.2 seconds   INR 3.0 (H) 0.8 - 1.2    Comment: (NOTE) INR goal varies based on device and disease states. Performed at [facility], [place] [address] [place], [place] [address]   Basic metabolic panel     Status: Abnormal   Collection Time: 01/24/21  5:09 AM  Result Value Ref Range   Sodium 138 135 - 145 mmol/L   Potassium 4.9 3.5 - 5.1 mmol/L   Chloride 92 (L) 98 - 111 mmol/L   CO2 37 (H) 22 - 32 mmol/L   Glucose, Bld 103 (H) 70 - 99 mg/dL    Comment: Glucose reference range applies only to samples taken after fasting for at least 8 hours.   BUN 88 (H) 8 - 23 mg/dL   Creatinine, Ser 1.46 (H) 0.44 - 1.00 mg/dL   Calcium  9.3 8.9 - 10.3 mg/dL   GFR, Estimated 35 (L) >60 mL/min    Comment: (NOTE) Calculated using the CKD-EPI Creatinine Equation (2021)    Anion gap 9 5 - 15    Comment: Performed at [facility], [place] [address], [place], [place] [address]  Magnesium     Status: None   Collection Time: 01/24/21  5:09 AM  Result Value Ref Range   Magnesium 1.9 1.7 - 2.4 mg/dL    Comment: Performed at [facility], [place] [address] [place], [place] [address]  Phosphorus     Status: None   Collection Time: 01/24/21  5:09 AM  Result Value Ref Range   Phosphorus 3.5 2.5 - 4.6 mg/dL    Comment: Performed at [facility], [place] [address] [place], [place] [address]   *Note: Due to a large number of results and/or encounters for the requested time period, some results have not been displayed. A complete set of results can be found in Results Review.    ECG   N/A  Telemetry   Paced with PVCs- Personally Reviewed  Radiology  VAS [place] UPPER EXTREMITY VENOUS DUPLEX  Result Date: 01/23/2021 UPPER VENOUS STUDY  Patient Name:  [name] [name]  Date of Exam:   01/23/2021 Medical Rec #: [id number]       Accession #:    [id number] Date of Birth: 09/21/1932       Patient Gender: F Patient Age:   88Y Exam Location:  [facility] Procedure:      VAS [place] UPPER EXTREMITY VENOUS DUPLEX Referring Phys: [id number] PARDEEP KUMAR --------------------------------------------------------------------------------  Indications: Pain, and Swelling Comparison Study: no prior Performing Technologist: Abram Sander RVS  Examination Guidelines: A complete evaluation includes B-mode imaging, spectral Doppler, color Doppler, and power Doppler as needed of all accessible portions of each vessel. Bilateral testing is considered an integral part of a complete examination. Limited examinations for reoccurring indications may be performed as noted.  Left Findings:  +----------+------------+---------+-----------+----------+-------+ LEFT      CompressiblePhasicitySpontaneousPropertiesSummary +----------+------------+---------+-----------+----------+-------+ IJV           Full       Yes       Yes                      +----------+------------+---------+-----------+----------+-------+ Subclavian    Full       Yes       Yes                      +----------+------------+---------+-----------+----------+-------+ Axillary      Full       Yes       Yes                      +----------+------------+---------+-----------+----------+-------+ Brachial      Full       Yes       Yes                      +----------+------------+---------+-----------+----------+-------+ Radial        Full                                          +----------+------------+---------+-----------+----------+-------+ Ulnar         Full                                          +----------+------------+---------+-----------+----------+-------+ Cephalic      Full                                          +----------+------------+---------+-----------+----------+-------+ Basilic       Full                                          +----------+------------+---------+-----------+----------+-------+  Summary:  Left: No evidence of deep vein thrombosis in the upper extremity. No evidence of superficial vein thrombosis in the upper extremity. No evidence of thrombosis in the subclavian. Nonvascularized area of mixed echos measuring 3.57cm x 2.50cm in the proximal forearm. Etiology unknow.  *See table(s) above for measurements and observations.  Diagnosing physician: Harold Barban MD Electronically signed by Harold Barban MD  on 01/23/2021 at 4:26:05 PM.    Final     Cardiac Studies   2D echo 12/2020 IMPRESSIONS  1. Left ventricular ejection fraction, by estimation, is 25 to 30%. The  left ventricle has severely decreased function. The left ventricle   demonstrates global hypokinesis. The left ventricular internal cavity size  was moderately dilated. There is mild  concentric left ventricular hypertrophy. Left ventricular diastolic  parameters are indeterminate.  2. Right ventricular systolic function is normal. The right ventricular  size is normal. There is severely elevated pulmonary artery systolic  pressure.  3. Left atrial size was severely dilated.  4. Right atrial size was severely dilated.  5. A small pericardial effusion is present. The pericardial effusion is  posterior to the left ventricle.  6. Mitral valve gradient essentially unchanged form 01/2020. The mitral  valve has been repaired/replaced. No evidence of mitral valve  regurgitation. No evidence of mitral stenosis. The mean mitral valve  gradient is 6.0 mmHg. There is a mechanical valve  present in the mitral position. Procedure Date: 2004.  7. Tricuspid valve regurgitation is mild to moderate.  8. The aortic valve has been repaired/replaced. Aortic valve  regurgitation is not visualized. No aortic stenosis is present. There is a  mechanical valve present in the aortic position. Procedure Date: 2001.  Echo findings are consistent with normal  structure and function of the aortic valve prosthesis. Aortic valve area,  by VTI measures 1.32 cm. Aortic valve mean gradient measures 7.0 mmHg.  Aortic valve Vmax measures 2.00 m/s.  9. Aortic dilatation noted. There is mild dilatation of the ascending  aorta, measuring 38 mm.  10. The inferior vena cava is dilated in size with <50% respiratory  variability, suggesting right atrial pressure of 15 mmHg.   Plan   Acute on chronic combined systolic and diastolic heart failure -She does not have prior h/o CAD, reportedly had a cath in [place] prior to AVR in 2001 that showed normal coronary arteries. -She is not a good cath candidate given renal dysfunction, recommend medical therapy with plan to repeat echo later.   -Initial chest x-ray showed vascular congestion.  -last cardiology note mentioned she was on 80 mg of Lasix +2.5 mg daily of metolazone, however patient did not remember taking the metolazone recently.  Based on her med list, it appears she had been on 80 mg twice daily of Lasix with no metolazone. -She has been placed on 40 mg twice daily of IV Lasix  -She put out 2.25L yesterday and is net neg 8.6L since admit.   -weights down 8lbs from yesterday and 4lbs overall>>?accuracy -She says her breathing is approaching baseline.    -SCr improving with diuresis (2.27>1.93>1.71>1.62>1.46) -BNP remains elevated a 519 but this may be where she stays -continue Toprol XL 50mg  daily  -increase Hydralazine to 10mg  TID and add Imdur 15mg  daily for LV dysfunction and titrate as BP allows -will continue to diurese with IV diuretics as long as SCr continues to decline -cannot use ACE I/ARB/ARNi due to AKI  Persistent atrial fibrillation:  -Vpaced with PVCs on tele but cannot tell if she is in NSR -repeat 12 lead EKG -continue on 50mg  BID metoprolol tartrate.  -On Coumadin for mechanical valve>>INR 3 today>>her goal is 2-2.5 per Dr. Tanna Furry notes in 2018 due to increased bruising on higher levels  Complete heart block  -s/p Medtronic BiV pacemaker 01/30/2019  Mechanical aortic and mitral valve replacement:  -On Coumadin -INR initially subtherapeutic -INR today  therapeutic at 3  I have spent a total of 35 minutes with patient reviewing  telemetry, EKGs, labs and examining patient as well as establishing an assessment and plan that was discussed with the patient.  > 50% of time was spent in direct patient care.    Derreck Wiltsey 01/24/2021, 11:23 AM

## 2021-01-24 NOTE — Progress Notes (Signed)
PROGRESS NOTE    [name] [name]  EPP:[id number] DOB: [birth year] DOA: 01/18/2021 PCP: Fayrene Helper, MD   Brief Narrative: This 87 years old female with PMH significant for COPD, chronic combined systolic and diastolic heart failure, complete heart block s/p [employer] pacemaker, AICD, A. fib, history of mechanical aortic valve on Coumadin, chronic kidney disease stage IIIb, hypothyroidism, seizure disorder presented in the ED with progressive shortness of breath.  Patient has received a dose of Retacrit prior to coming to the hospital. Patient was found to be lethargic on arrival in the ED with ABG showing pH of 7.2 , PCO2 of 80.  chest x-ray shows pulmonary congestion.  Patient was started on BiPAP, nebulization and IV Lasix.  Patient improved on BiPAP and for treatment for heart failure and COPD exacerbation. Patient is admitted for acute hypoxic and hypercapnic respiratory failure secondary to COPD and CHF exacerbation.  Assessment & Plan:   Principal Problem:   Acute respiratory failure with hypoxia and hypercapnia (HCC) Active Problems:   Hx of aortic valve replacement, mechanical   S/P MVR (mitral valve replacement)   Chronic anticoagulation, on coumadin for Mechanical AVR and MVR   S/P ICD (internal cardiac defibrillator) procedure   Persistent atrial fibrillation (HCC)   COPD with acute exacerbation (HCC)   Type 2 diabetes mellitus with hypoglycemia without coma (HCC)   Acute systolic CHF (congestive heart failure) (HCC)   Stage 3b chronic kidney disease (HCC)   Seizure (HCC)   Acute respiratory failure with hypoxia (HCC)  Acute hypoxic and hypercapnic respiratory failure in the setting of COPD and CHFexacerbation. Patient presented with hypoxic and hypercapnic respiratory failure. She was lethargic on arrival with a pH of 7.2,  PCO2 of 80. Patient was initially placed on BiPAP and she improved. She is off BiPAP now. Continue Bipap as needed. Continued with IV Lasix,  Solu-Medrol and nebulization. She is 2.5 L negative balance in the last 24 hours. So far she is 8.2 L negative since admission. Continue IV Lasix 40 mg IV twice daily for today. Solu-Medrol changed to prednisone taper. Continue supplemental oxygen to keep saturation above 94%. Continue incentive spirometry  Acute on chronic combined systolic and diastolic heart failure: Continue with IV Lasix twice daily, monitor intake and output. Urine output 2.5 L in last 24 hours. Echo shows LVEF 25 to 30%. Cardiology recommended to continue iv diuresis.  COPD exacerbation: Continue with nebulizer, Pulmicort. Continue supplemental oxygen.  AKI on CKD stage IIIb: Serum creatinine improving and almost backing to the baseline.  Acute metabolic encephalopathy: > Resolved Patient was lethargic on arrival secondary to PCO2 retention which has improved with BiPAP. Patient is alert and oriented, back to her baseline mental status, she is fully involved in conversation  History of mechanical aortic valve and mitral valve replacement on Coumadin INR therapeutic,  continue with Coumadin.  Hypothyroidism: Continue Synthroid.  History of seizure disorder: Continue Keppra  Hypokalemia: Resolved  Anemia of chronic disease:   monitor H&H. Hb stable 8.5 > 8.9  Left arm thrombophlebitis. Patient has developed significant left arm swelling at the IV site. IV infiltrated which was removed.  Significant bruising on the medial side of the arm. Venous duplex negative for DVT. Advised elevation and icing.  . Pressure Injury 07/08/20 Buttocks Right Stage 2 -  Partial thickness loss of dermis presenting as a shallow open injury with a red, pink wound bed without slough. 2 cm x 2cm (Active)  07/08/20 0731  Location: Buttocks  Location Orientation: Right  Staging: Stage 2 -  Partial thickness loss of dermis presenting as a shallow open injury with a red, pink wound bed without slough.  Wound Description  (Comments): 2 cm x 2cm  Present on Admission: No     DVT prophylaxis: Heparin Code Status:  Full code. Family Communication: No family at bed side. Disposition Plan:   Status is: Inpatient  Remains inpatient appropriate because:Inpatient level of care appropriate due to severity of illness   Dispo: The patient is from: Home              Anticipated d/c is to: SNF on 5/3              Patient currently is not medically stable to d/c.   Difficult to place patient No  Consultants:    Cardiology  Procedures:  Antimicrobials:  Anti-infectives (From admission, onward)   Start     Dose/Rate Route Frequency Ordered Stop   01/20/21 1600  azithromycin (ZITHROMAX) tablet 500 mg        500 mg Oral Daily 01/20/21 0850 01/22/21 0933   01/18/21 2200  azithromycin (ZITHROMAX) 500 mg in sodium chloride 0.9 % 250 mL IVPB  Status:  Discontinued        500 mg 250 mL/hr over 60 Minutes Intravenous Every 24 hours 01/18/21 2136 01/20/21 0850      Subjective: Patient was seen and examined at bedside.  Overnight events noted.  Patient reports feeling much improved. She reports she is at her baseline breathing and oxygen requirement. Her left arm is pretty swollen and bruised,  Reports mild soreness.   Objective: Vitals:   01/23/21 2021 01/24/21 0446 01/24/21 0500 01/24/21 0932  BP: (!) 155/61 131/61    Pulse: (!) 59 60    Resp:  20    Temp:  98.2 F (36.8 C)    TempSrc:  Oral    SpO2:  100%  100%  Weight:   89 kg   Height:        Intake/Output Summary (Last 24 hours) at 01/24/2021 1159 Last data filed at 01/24/2021 0838 Gross per 24 hour  Intake 480 ml  Output 2175 ml  Net -1695 ml   Filed Weights   01/22/21 0500 01/23/21 0416 01/24/21 0500  Weight: 90.9 kg 92.8 kg 89 kg    Examination:  General exam: Appears calm and comfortable, not in any acute distress.  Respiratory system: Clear to auscultation. Respiratory effort normal. Cardiovascular system: S1 & S2 heard, RRR. No  JVD, murmurs, rubs, gallops or clicks. No pedal edema. Gastrointestinal system: Abdomen is nondistended, soft and nontender. No organomegaly or masses felt. Normal bowel sounds heard. Central nervous system: Alert and oriented. No focal neurological deficits. Extremities: No edema, no cyanosis, no clubbing. Left arm swelling at IV site, sore and bruised.  Skin: No rashes, lesions or ulcers Psychiatry: Judgement and insight appear normal. Mood & affect appropriate.     Data Reviewed: I have personally reviewed following labs and imaging studies  CBC: Recent Labs  Lab 01/18/21 1114 01/18/21 1656 01/18/21 1945 01/19/21 1821 01/20/21 0238 01/21/21 0513 01/22/21 0439 01/23/21 0615  WBC 5.1 5.4   < > 4.1 6.0 7.5 7.2 7.5  NEUTROABS 3.7 4.3  --   --   --   --   --   --   HGB 10.5* 10.7*   < > 9.9* 8.5* 8.5* 8.5* 8.9*  HCT 35.9* 36.4   < > 33.5* 27.8* 27.9* 27.7* 29.6*  MCV 93.0  92.6   < > 92.0 90.0 89.7 89.4 89.7  PLT 166 193   < > 169 159 157 168 180   < > = values in this interval not displayed.   Basic Metabolic Panel: Recent Labs  Lab 01/18/21 1142 01/18/21 1930 01/20/21 0238 01/21/21 0513 01/22/21 0439 01/23/21 0615 01/24/21 0509  NA 139   < > 141 139 141 133* 138  K 4.6   < > 4.5 4.4 4.8 4.4 4.9  CL 99   < > 102 97* 96* 90* 92*  CO2 29   < > 30 31 35* 35* 37*  GLUCOSE 104*   < > 119* 105* 117* 104* 103*  BUN 87*   < > 93* 91* 93* 91* 88*  CREATININE 2.84*   < > 2.27* 1.93* 1.71* 1.62* 1.46*  CALCIUM 9.5   < > 8.9 8.9 9.4 8.9 9.3  MG  --   --   --   --   --  1.8 1.9  PHOS 4.6  --   --   --   --  3.2 3.5   < > = values in this interval not displayed.   GFR: Estimated Creatinine Clearance: 31.7 mL/min (A) (by C-G formula based on SCr of 1.46 mg/dL (H)). Liver Function Tests: Recent Labs  Lab 01/18/21 1142 01/18/21 1930 01/19/21 0718 01/23/21 0615  AST  --  32 41 34  ALT  --  20 25 29   ALKPHOS  --  38 46 39  BILITOT  --  0.4 0.4 0.4  PROT  --  5.7* 7.7 6.5   ALBUMIN 3.9 2.8* 3.7 3.3*   No results for input(s): LIPASE, AMYLASE in the last 168 hours. Recent Labs  Lab 01/19/21 0718  AMMONIA 19   Coagulation Profile: Recent Labs  Lab 01/20/21 0238 01/21/21 0513 01/22/21 0439 01/23/21 0615 01/24/21 0509  INR 2.4* 2.9* 2.8* 3.1* 3.0*   Cardiac Enzymes: No results for input(s): CKTOTAL, CKMB, CKMBINDEX, TROPONINI in the last 168 hours. BNP (last 3 results) No results for input(s): PROBNP in the last 8760 hours. HbA1C: No results for input(s): HGBA1C in the last 72 hours. CBG: Recent Labs  Lab 01/23/21 1120 01/23/21 1640 01/24/21 0025 01/24/21 0448 01/24/21 1139  GLUCAP 158* 99 127* 103* 132*   Lipid Profile: No results for input(s): CHOL, HDL, LDLCALC, TRIG, CHOLHDL, LDLDIRECT in the last 72 hours. Thyroid Function Tests: No results for input(s): TSH, T4TOTAL, FREET4, T3FREE, THYROIDAB in the last 72 hours. Anemia Panel: No results for input(s): VITAMINB12, FOLATE, FERRITIN, TIBC, IRON, RETICCTPCT in the last 72 hours. Sepsis Labs: No results for input(s): PROCALCITON, LATICACIDVEN in the last 168 hours.  Recent Results (from the past 240 hour(s))  Resp Panel by RT-PCR (Flu A&B, Covid) Nasopharyngeal Swab     Status: None   Collection Time: 01/18/21  5:08 PM   Specimen: Nasopharyngeal Swab; Nasopharyngeal(NP) swabs in vial transport medium  Result Value Ref Range Status   SARS Coronavirus 2 by RT PCR NEGATIVE NEGATIVE Final    Comment: (NOTE) SARS-CoV-2 target nucleic acids are NOT DETECTED.  The SARS-CoV-2 RNA is generally detectable in upper respiratory specimens during the acute phase of infection. The lowest concentration of SARS-CoV-2 viral copies this assay can detect is 138 copies/mL. A negative result does not preclude SARS-Cov-2 infection and should not be used as the sole basis for treatment or other patient management decisions. A negative result may occur with  improper specimen collection/handling,  submission of specimen other  than nasopharyngeal swab, presence of viral mutation(s) within the areas targeted by this assay, and inadequate number of viral copies(<138 copies/mL). A negative result must be combined with clinical observations, patient history, and epidemiological information. The expected result is Negative.  Fact Sheet for Patients:  EntrepreneurPulse.com.au  Fact Sheet for Healthcare Providers:  IncredibleEmployment.be  This test is no t yet approved or cleared by the [place] FDA and  has been authorized for detection and/or diagnosis of SARS-CoV-2 by FDA under an Emergency Use Authorization (EUA). This EUA will remain  in effect (meaning this test can be used) for the duration of the COVID-19 declaration under Section 564(b)(1) of the Act, 21 U.S.C.section 360bbb-3(b)(1), unless the authorization is terminated  or revoked sooner.       Influenza A by PCR NEGATIVE NEGATIVE Final   Influenza B by PCR NEGATIVE NEGATIVE Final    Comment: (NOTE) The Xpert Xpress SARS-CoV-2/FLU/RSV plus assay is intended as an aid in the diagnosis of influenza from Nasopharyngeal swab specimens and should not be used as a sole basis for treatment. Nasal washings and aspirates are unacceptable for Xpert Xpress SARS-CoV-2/FLU/RSV testing.  Fact Sheet for Patients: EntrepreneurPulse.com.au  Fact Sheet for Healthcare Providers: IncredibleEmployment.be  This test is not yet approved or cleared by the [place] FDA and has been authorized for detection and/or diagnosis of SARS-CoV-2 by FDA under an Emergency Use Authorization (EUA). This EUA will remain in effect (meaning this test can be used) for the duration of the COVID-19 declaration under Section 564(b)(1) of the Act, 21 U.S.C. section 360bbb-3(b)(1), unless the authorization is terminated or revoked.  Performed at [facility], [place] [address] [place], [place] [address]   MRSA PCR Screening     Status: None   Collection Time: 01/18/21  9:58 PM   Specimen: Nasal Mucosa; Nasopharyngeal  Result Value Ref Range Status   MRSA by PCR NEGATIVE NEGATIVE Final    Comment:        The GeneXpert MRSA Assay (FDA approved for NASAL specimens only), is one component of a comprehensive MRSA colonization surveillance program. It is not intended to diagnose MRSA infection nor to guide or monitor treatment for MRSA infections. Performed at [facility], [place] [address], [place], [place] [address]     Radiology Studies: VAS [place] UPPER EXTREMITY VENOUS DUPLEX  Result Date: 01/23/2021 UPPER VENOUS STUDY  Patient Name:  [name] [name]  Date of Exam:   01/23/2021 Medical Rec #: [id number]       Accession #:    [id number] Date of Birth: 1932/01/17       Patient Gender: F Patient Age:   88Y Exam Location:  [facility] Procedure:      VAS [place] UPPER EXTREMITY VENOUS DUPLEX Referring Phys: [id number] Noriel Guthrie --------------------------------------------------------------------------------  Indications: Pain, and Swelling Comparison Study: no prior Performing Technologist: Abram Sander RVS  Examination Guidelines: A complete evaluation includes B-mode imaging, spectral Doppler, color Doppler, and power Doppler as needed of all accessible portions of each vessel. Bilateral testing is considered an integral part of a complete examination. Limited examinations for reoccurring indications may be performed as noted.  Left Findings: +----------+------------+---------+-----------+----------+-------+ LEFT      CompressiblePhasicitySpontaneousPropertiesSummary +----------+------------+---------+-----------+----------+-------+ IJV           Full       Yes       Yes                      +----------+------------+---------+-----------+----------+-------+  Subclavian    Full       Yes       Yes                       +----------+------------+---------+-----------+----------+-------+ Axillary      Full       Yes       Yes                      +----------+------------+---------+-----------+----------+-------+ Brachial      Full       Yes       Yes                      +----------+------------+---------+-----------+----------+-------+ Radial        Full                                          +----------+------------+---------+-----------+----------+-------+ Ulnar         Full                                          +----------+------------+---------+-----------+----------+-------+ Cephalic      Full                                          +----------+------------+---------+-----------+----------+-------+ Basilic       Full                                          +----------+------------+---------+-----------+----------+-------+  Summary:  Left: No evidence of deep vein thrombosis in the upper extremity. No evidence of superficial vein thrombosis in the upper extremity. No evidence of thrombosis in the subclavian. Nonvascularized area of mixed echos measuring 3.57cm x 2.50cm in the proximal forearm. Etiology unknow.  *See table(s) above for measurements and observations.  Diagnosing physician: Harold Barban MD Electronically signed by Harold Barban MD on 01/23/2021 at 4:26:05 PM.    Final     Scheduled Meds: . budesonide (PULMICORT) nebulizer solution  0.5 mg Nebulization BID  . chlorhexidine  15 mL Mouth Rinse BID  . furosemide  40 mg Intravenous Q12H  . gabapentin  300 mg Oral BID  . hydrALAZINE  10 mg Oral Q8H  . ipratropium-albuterol  3 mL Nebulization BID  . isosorbide mononitrate  15 mg Oral Daily  . levETIRAcetam  500 mg Oral BID  . levothyroxine  88 mcg Oral QAC breakfast  . mouth rinse  15 mL Mouth Rinse q12n4p  . metoprolol succinate  50 mg Oral Daily  . warfarin  10 mg Oral Once per day on Sun Tue Wed Thu Sat   And  . warfarin  5 mg Oral Once per day on  Mon Fri  . Warfarin - Pharmacist Dosing Inpatient   Does not apply q1600   Continuous Infusions:   LOS: 6 days    Time spent: 25 mins    Jomari Bartnik, MD [facility]   If 7PM-7AM, please contact night-coverage

## 2021-01-24 NOTE — Progress Notes (Signed)
ANTICOAGULATION CONSULT NOTE - Follow Up Consult  Pharmacy Consult for Warfarin Indication: mechanical aortic and mitral valve replacement, atrial fibrillation   Allergies  Allergen Reactions  . Penicillins Other (See Comments)    Bruise    . Aspirin Other (See Comments)    On coumadin  . Fentanyl Dermatitis    Rash with patch   . Niacin Itching   Patient Measurements: Height: 5\' 7"  (170.2 cm) Weight: 89 kg (196 lb 3.4 oz) IBW/kg (Calculated) : 61.6  Vital Signs: Temp: 98.2 F (36.8 C) (05/02 0446) Temp Source: Oral (05/02 0446) BP: 131/61 (05/02 0446) Pulse Rate: 60 (05/02 0446)  Labs: Recent Labs    01/22/21 0439 01/23/21 0615 01/24/21 0509  HGB 8.5* 8.9*  --   HCT 27.7* 29.6*  --   PLT 168 180  --   LABPROT 29.2* 31.8* 31.2*  INR 2.8* 3.1* 3.0*  CREATININE 1.71* 1.62* 1.46*   Estimated Creatinine Clearance: 31.7 mL/min (A) (by C-G formula based on SCr of 1.46 mg/dL (H)).  Assessment: 25 yoF admitted on 4/26 with acute respiratory failure.  PMH significant for CHF, COPD, complete heart block s/p defib pacemaker, CKD-IV, mechanical aortic valve replacement, mitral valve replacement, atrial fibrillation on chronic warfarin anticoagulation. INR on admission = 1.6. Patient initially placed on IV heparin due to NPO status. Warfarin resumed on 4/27.    Home warfarin dose reported by patient as 10mg  daily (last dose PTA on 4/25). However, per [facility] Notes, warfarin dose to have been 5mg  on M/F and 10mg  on all other days.   Heparin drip stopped 4/29 AM  01/24/2021   INR = 3 today, above goal range   No bleeding reported, but significant bruising on medial side of L arm  LUE venous duplex negative for DVT  Hgb low/stable at 8.9, Pltc WNL (5/1)   Goal of Therapy:   INR 2-2.5 per outpatient notes based on Dr. Tanna Furry orders in 04/2017 due to significant bruising (discussed with Dr. Radford Pax today-per MD, use INR goal as designated by outpatient  provider)  Monitor platelets by anticoagulation protocol: Yes   Plan:  Hold warfarin today Daily PT/INR Monitor CBC Monitor closely for s/sx of bleeding or any worsening bruising   Lindell Spar, PharmD, BCPS Clinical Pharmacist  01/24/2021 1:01 PM

## 2021-01-24 NOTE — Care Management Important Message (Signed)
Important Message  Patient Details  IM Letter given to the Patient. Name: [name] [name] MRN: [id number] Date of Birth: Apr 14, 1932   Medicare Important Message Given:  Yes     Kerin Salen 01/24/2021, 11:00 AM

## 2021-01-24 NOTE — TOC Progression Note (Signed)
Transition of Care [facility]) - Progression Note    Patient Details  Name: [name] [name] MRN: [id number] Date of Birth: 1932-07-31  Transition of Care [facility]) CM/SW [place], [place] Phone Number: 01/24/2021, 9:56 AM  Clinical Narrative:   Patient seen in follow up to MD alert that she has changed her mind and now wants to go to short term rehab. [name] confirms this plan, states she desires a bed at [facility] or [facility] if possible.  Bed search initiated. TOC will continue to follow during the course of hospitalization.     Expected Discharge Plan: Skilled Nursing Facility Barriers to Discharge: SNF Pending bed offer  Expected Discharge Plan and Services Expected Discharge Plan: [place]   Discharge Planning Services: CM Consult Post Acute Care Choice: [place] arrangements for the past 2 months: Single Family Home                                       Social Determinants of Health (SDOH) Interventions    Readmission Risk Interventions Readmission Risk Prevention Plan 07/08/2020 04/01/2020 04/01/2020  Transportation Screening Complete Complete Complete  PCP or Specialist Appt within 3-5 Days - - -  HRI or [place] Work Consult for [place] - - -  Medication Review Press photographer) Complete Complete -  PCP or Specialist appointment within 3-5 days of discharge Complete - -  HRI or [place] Complete Complete Complete  SW Recovery Care/Counseling Consult Complete Complete -  Palliative Care Screening Not Applicable Not Applicable Not Applicable  Skilled Nursing Facility Complete Not Applicable Not Applicable  Some recent data might be hidden

## 2021-01-24 NOTE — Plan of Care (Signed)

## 2021-01-24 NOTE — NC FL2 (Signed)
[place] LEVEL OF CARE SCREENING TOOL     IDENTIFICATION  Patient Name: [name] [name] Birthdate: 01/04/1932 Sex: female Admission Date (Current Location): 01/18/2021  [facility] [place] [facility] [place] Number:  Herbalist and Address:  [facility],  [address] [place], [place]      Provider Number: [id number]  Attending Physician Name and Address:  Shawna Clamp, MD  Relative Name and Phone Number:  Polo Riley (Niece)   [phone]    Current Level of Care: Hospital Recommended Level of Care: [place] Prior Approval Number:    Date Approved/Denied:   PASRR Number: [id number]  Discharge Plan: SNF    Current Diagnoses: Patient Active Problem List   Diagnosis Date Noted  . Acute respiratory failure with hypoxia ([place]) 01/18/2021  . Acute respiratory failure with hypoxia and hypercapnia ([place]) 01/18/2021  . Gait abnormality 08/24/2020  . Chronic constipation 08/09/2020  . Acute UTI 08/03/2020  . Hypertensive heart and kidney disease with chronic systolic congestive heart failure and stage 4 chronic kidney disease ([place]) 07/15/2020  . Hyperlipidemia associated with type 2 diabetes mellitus ([place]) 07/15/2020  . Seizure ([place]) 07/07/2020  . Goals of care, counseling/discussion   . Palliative care by specialist   . Idiopathic hypotension   . CHF exacerbation ([place]) 06/29/2020  . Arthritis of right knee 06/20/2020  . Leg swelling 06/16/2020  . Generalized osteoarthritis 05/09/2020  . Primary osteoarthritis, left shoulder 04/21/2020  . Acute systolic CHF (congestive heart failure) ([place]) 03/31/2020  . Bilateral lower extremity edema   . Acute on chronic congestive heart failure ([place]) 03/30/2020  . Right-sided epistaxis 03/28/2020  . Acute pain of left shoulder 01/15/2020  . AAA (abdominal aortic aneurysm) without rupture ([place]) 01/14/2020  . COVID-19 virus detected 11/23/2019  . Stage 3b chronic kidney disease ([place]) 08/04/2019   . Dyspnea 05/30/2019  . Anemia 05/30/2019  . Gout   . Glaucoma   . GERD (gastroesophageal reflux disease)   . Physical debility 04/28/2019  . Encounter for examination following treatment at hospital 11/03/2018  . Hyponatremia 09/27/2018  . H/O mitral valve replacement with mechanical valve 08/02/2018  . Chronic right hip pain 04/16/2018  . Chronic systolic congestive heart failure ([place]) 11/15/2017  . COPD with acute exacerbation ([place]) 11/15/2017  . Trochanteric bursitis, right hip 03/07/2017  . Persistent atrial fibrillation ([place])   . Nocturnal hypoxia 10/08/2016  . Dependence on nocturnal oxygen therapy 10/08/2016  . Hematuria 03/20/2016  . S/P ICD (internal cardiac defibrillator) procedure 02/08/2015  . Presence of cardiac pacemaker 02/08/2015  . CHB (complete heart block) ([place]) 10/07/2014  . Fatigue 07/20/2014  . Absolute anemia 06/24/2014  . Osteopenia 02/10/2014  . Encounter for therapeutic drug monitoring 10/29/2013  . OA (osteoarthritis) of knee 02/19/2013  . Chronic pain of right knee 01/02/2013  . Hemolytic anemia ([place]) 09/24/2012  . High risk medication use 05/24/2012  . COPD (chronic obstructive pulmonary disease) ([place]) 05/19/2012  . Chronic anticoagulation, on coumadin for Mechanical AVR and MVR 04/01/2012  . Cardiorenal syndrome with renal failure 03/22/2012  . Hx of aortic valve replacement, mechanical 03/22/2012  . S/P MVR (mitral valve replacement) 03/22/2012  . Biventricular automatic implantable cardioverter defibrillator in situ 03/22/2012  . CKD (chronic kidney disease), stage IV ([place]) 03/22/2012  . Warfarin-induced coagulopathy ([place]) 03/21/2012  . Iron deficiency 10/04/2011  . Allergic rhinitis 02/14/2011  . Mitral valve disease 07/15/2010  . Sinus node dysfunction ([place]) 07/15/2010  . Type 2 diabetes mellitus with hypoglycemia without coma ([place])  07/15/2010  . VARICOSE VEINS LOWER EXTREMITIES W/OTH COMPS 02/09/2009  . Prediabetes 06/15/2008  .  Hypothyroidism, postsurgical 02/24/2008  . Hyperlipidemia LDL goal <70 02/24/2008  . Essential hypertension 02/24/2008  . Non-ischemic cardiomyopathy with ICD 02/24/2008  . Spondylosis 02/24/2008    Orientation RESPIRATION BLADDER Height & Weight     Self,Situation,Place  O2 (2L [place]) External catheter Weight: 89 kg Height:  5\' 7"  (170.2 cm)  BEHAVIORAL SYMPTOMS/MOOD NEUROLOGICAL BOWEL NUTRITION STATUS   (none)  (none) Continent Diet (see d/c summary)  AMBULATORY STATUS COMMUNICATION OF NEEDS Skin   Extensive Assist Verbally Normal                       Personal Care Assistance Level of Assistance  Bathing,Feeding,Dressing Bathing Assistance: Maximum assistance Feeding assistance: Independent Dressing Assistance: Limited assistance     Functional Limitations Info  Sight,Hearing,Speech Sight Info: Adequate Hearing Info: Adequate Speech Info: Adequate    SPECIAL CARE FACTORS FREQUENCY  PT (By licensed PT),OT (By licensed OT)     PT Frequency: 5X/W OT Frequency: 5X/W            Contractures Contractures Info: Not present    Additional Factors Info  Code Status,Allergies Code Status Info: Full Allergies Info: Penicillins, Fentanyl, Niacin, Aspirin           Current Medications (01/24/2021):  This is the current hospital active medication list Current Facility-Administered Medications  Medication Dose Route Frequency Provider Last Rate Last Admin  . acetaminophen (TYLENOL) tablet 650 mg  650 mg Oral Q6H PRN Regalado, Belkys A, MD   650 mg at 01/23/21 1034   Or  . acetaminophen (TYLENOL) suppository 650 mg  650 mg Rectal Q6H PRN Regalado, Belkys A, MD      . budesonide (PULMICORT) nebulizer solution 0.5 mg  0.5 mg Nebulization BID Regalado, Belkys A, MD   0.5 mg at 01/24/21 0932  . chlorhexidine (PERIDEX) 0.12 % solution 15 mL  15 mL Mouth Rinse BID Regalado, Belkys A, MD   15 mL at 01/23/21 2215  . furosemide (LASIX) injection 40 mg  40 mg Intravenous Q12H  Regalado, Belkys A, MD   40 mg at 01/23/21 2215  . gabapentin (NEURONTIN) capsule 300 mg  300 mg Oral BID Regalado, Belkys A, MD   300 mg at 01/23/21 2215  . hydrALAZINE (APRESOLINE) injection 10 mg  10 mg Intravenous Q6H PRN Regalado, Belkys A, MD   10 mg at 01/19/21 0840  . hydrALAZINE (APRESOLINE) tablet 10 mg  10 mg Oral BID Regalado, Belkys A, MD   10 mg at 01/23/21 2215  . ipratropium-albuterol (DUONEB) 0.5-2.5 (3) MG/3ML nebulizer solution 3 mL  3 mL Nebulization BID Regalado, Belkys A, MD   3 mL at 01/24/21 0932  . levETIRAcetam (KEPPRA) tablet 500 mg  500 mg Oral BID Regalado, Belkys A, MD   500 mg at 01/23/21 2214  . levothyroxine (SYNTHROID) tablet 88 mcg  88 mcg Oral QAC breakfast Regalado, Belkys A, MD   88 mcg at 01/24/21 0630  . MEDLINE mouth rinse  15 mL Mouth Rinse q12n4p Regalado, Belkys A, MD   15 mL at 01/23/21 1752  . metoprolol succinate (TOPROL-XL) 24 hr tablet 50 mg  50 mg Oral Daily Sueanne Margarita, MD   50 mg at 01/23/21 1006  . ondansetron (ZOFRAN) tablet 4 mg  4 mg Oral Q8H PRN Regalado, Belkys A, MD   4 mg at 01/22/21 1304  . warfarin (COUMADIN) tablet 10  mg  10 mg Oral Once per day on Sun Tue Wed Thu Sat Eudelia Bunch, RPH   10 mg at 01/23/21 1751   And  . warfarin (COUMADIN) tablet 5 mg  5 mg Oral Once per day on Mon Fri Eudelia Bunch, [place]      . Warfarin - Pharmacist Dosing Inpatient   Does not apply q1600 Regalado, Belkys A, MD         Discharge Medications: Please see discharge summary for a list of discharge medications.  Relevant Imaging Results:  Relevant Lab Results:   Additional Information SSN: [id number]  [place], [place]

## 2021-01-25 ENCOUNTER — Encounter (HOSPITAL_COMMUNITY): Payer: Self-pay | Admitting: Internal Medicine

## 2021-01-25 ENCOUNTER — Ambulatory Visit: Payer: Medicare Other | Admitting: Orthopaedic Surgery

## 2021-01-25 DIAGNOSIS — J9602 Acute respiratory failure with hypercapnia: Secondary | ICD-10-CM | POA: Diagnosis not present

## 2021-01-25 DIAGNOSIS — Z7901 Long term (current) use of anticoagulants: Secondary | ICD-10-CM | POA: Diagnosis not present

## 2021-01-25 DIAGNOSIS — Z952 Presence of prosthetic heart valve: Secondary | ICD-10-CM | POA: Diagnosis not present

## 2021-01-25 DIAGNOSIS — J9601 Acute respiratory failure with hypoxia: Secondary | ICD-10-CM | POA: Diagnosis not present

## 2021-01-25 DIAGNOSIS — I5021 Acute systolic (congestive) heart failure: Secondary | ICD-10-CM | POA: Diagnosis not present

## 2021-01-25 LAB — BASIC METABOLIC PANEL
Anion gap: 9 (ref 5–15)
BUN: 93 mg/dL — ABNORMAL HIGH (ref 8–23)
CO2: 36 mmol/L — ABNORMAL HIGH (ref 22–32)
Calcium: 8.7 mg/dL — ABNORMAL LOW (ref 8.9–10.3)
Chloride: 88 mmol/L — ABNORMAL LOW (ref 98–111)
Creatinine, Ser: 1.91 mg/dL — ABNORMAL HIGH (ref 0.44–1.00)
GFR, Estimated: 25 mL/min — ABNORMAL LOW (ref >60)
Glucose, Bld: 94 mg/dL (ref 70–99)
Potassium: 4.4 mmol/L (ref 3.5–5.1)
Sodium: 133 mmol/L — ABNORMAL LOW (ref 135–145)

## 2021-01-25 LAB — GLUCOSE, CAPILLARY
Glucose-Capillary: 129 mg/dL — ABNORMAL HIGH (ref 70–99)
Glucose-Capillary: 89 mg/dL (ref 70–99)
Glucose-Capillary: 105 mg/dL — ABNORMAL HIGH (ref 70–99)
Glucose-Capillary: 85 mg/dL (ref 70–99)

## 2021-01-25 LAB — HEMOGLOBIN AND HEMATOCRIT, BLOOD
HCT: 28.2 % — ABNORMAL LOW (ref 36.0–46.0)
Hemoglobin: 8.7 g/dL — ABNORMAL LOW (ref 12.0–15.0)

## 2021-01-25 LAB — PROTIME-INR
INR: 2.4 — ABNORMAL HIGH (ref 0.8–1.2)
Prothrombin Time: 26.2 seconds — ABNORMAL HIGH (ref 11.4–15.2)

## 2021-01-25 MED ORDER — WARFARIN SODIUM 5 MG PO TABS
10.0000 mg | ORAL_TABLET | Freq: Once | ORAL | Status: AC
  Administered 2021-01-25: 10 mg via ORAL
  Filled 2021-01-25: qty 2

## 2021-01-25 MED ORDER — SENNOSIDES-DOCUSATE SODIUM 8.6-50 MG PO TABS
1.0000 | ORAL_TABLET | Freq: Two times a day (BID) | ORAL | Status: DC | PRN
  Administered 2021-01-25 – 2021-01-28 (×2): 1 via ORAL
  Filled 2021-01-25 (×2): qty 1

## 2021-01-25 NOTE — Progress Notes (Signed)
DAILY PROGRESS NOTE   Patient Name: [name] [name] Date of Encounter: 01/25/2021 Cardiologist: Sanda Klein, MD   Patient Profile   [name] [name] is a 87 y.o. 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 Tyrell Antonio.  Subjective  Denies any chest pain and SOB continues to improve.  Still diuresing well and puto ut 2.95L yesterday and is net neg 10.7L.  Objective   Vitals:   01/24/21 1329 01/24/21 2014 01/24/21 2103 01/25/21 0514  BP: (!) 122/54 138/90  (!) 133/100  Pulse: (!) 59 69  61  Resp: 17 18  19   Temp: 98.8 F (37.1 C) 98.8 F (37.1 C)  97.7 F (36.5 C)  TempSrc:      SpO2: 100% 100% 99% 100%  Weight:    86.2 kg  Height:        Intake/Output Summary (Last 24 hours) at 01/25/2021 0920 Last data filed at 01/25/2021 2585 Gross per 24 hour  Intake 476 ml  Output 2550 ml  Net -2074 ml   Filed Weights   01/23/21 0416 01/24/21 0500 01/25/21 0514  Weight: 92.8 kg 89 kg 86.2 kg    Physical Exam  GEN: Well nourished, well developed in no acute distress HEENT: Normal NECK: No JVD; No carotid bruits LYMPHATICS: No lymphadenopathy CARDIAC:RRR, no murmurs, rubs, gallops RESPIRATORY:  Clear to auscultation without rales, wheezing or rhonchi  ABDOMEN: Soft, non-tender, non-distended MUSCULOSKELETAL:  No edema; No deformity  SKIN: Warm and dry NEUROLOGIC:  Alert and oriented x 3 PSYCHIATRIC:  Normal affect    Inpatient Medications    Scheduled Meds: . budesonide (PULMICORT) nebulizer solution  0.5 mg Nebulization BID  . chlorhexidine  15 mL Mouth Rinse BID  . furosemide  40 mg Intravenous Q12H  . gabapentin  300 mg Oral BID  . hydrALAZINE  10 mg Oral Q8H   . ipratropium-albuterol  3 mL Nebulization BID  . isosorbide mononitrate  15 mg Oral Daily  . levETIRAcetam  500 mg Oral BID  . levothyroxine  88 mcg Oral QAC breakfast  . mouth rinse  15 mL Mouth Rinse q12n4p  . metoprolol succinate  50 mg Oral Daily  . warfarin  10 mg Oral ONCE-1600  . Warfarin - Pharmacist Dosing Inpatient   Does not apply q1600    Continuous Infusions:   PRN Meds: acetaminophen **OR** acetaminophen, hydrALAZINE, ondansetron, senna-docusate   Labs   Results for orders placed or performed during the hospital encounter of 01/18/21 (from the past 48 hour(s))  Glucose, capillary     Status: Abnormal   Collection Time: 01/23/21 11:20 AM  Result Value Ref Range   Glucose-Capillary 158 (H) 70 - 99 mg/dL    Comment: Glucose reference range applies only to samples taken after fasting for at least 8 hours.  Glucose, capillary     Status: None   Collection Time: 01/23/21  4:40 PM  Result Value Ref Range   Glucose-Capillary 99 70 - 99 mg/dL    Comment: Glucose reference range applies only to samples taken after fasting for at least 8 hours.  Glucose, capillary     Status: Abnormal   Collection Time: 01/24/21 12:25 AM  Result Value Ref Range   Glucose-Capillary 127 (H)  70 - 99 mg/dL    Comment: Glucose reference range applies only to samples taken after fasting for at least 8 hours.  Glucose, capillary     Status: Abnormal   Collection Time: 01/24/21  4:48 AM  Result Value Ref Range   Glucose-Capillary 103 (H) 70 - 99 mg/dL    Comment: Glucose reference range applies only to samples taken after fasting for at least 8 hours.  Protime-INR     Status: Abnormal   Collection Time: 01/24/21  5:09 AM  Result Value Ref Range   Prothrombin Time 31.2 (H) 11.4 - 15.2 seconds   INR 3.0 (H) 0.8 - 1.2    Comment: (NOTE) INR goal varies based on device and disease states. Performed at [facility], [place] [address], [place], [place] [address]   Basic  metabolic panel     Status: Abnormal   Collection Time: 01/24/21  5:09 AM  Result Value Ref Range   Sodium 138 135 - 145 mmol/L   Potassium 4.9 3.5 - 5.1 mmol/L   Chloride 92 (L) 98 - 111 mmol/L   CO2 37 (H) 22 - 32 mmol/L   Glucose, Bld 103 (H) 70 - 99 mg/dL    Comment: Glucose reference range applies only to samples taken after fasting for at least 8 hours.   BUN 88 (H) 8 - 23 mg/dL   Creatinine, Ser 1.46 (H) 0.44 - 1.00 mg/dL   Calcium 9.3 8.9 - 10.3 mg/dL   GFR, Estimated 35 (L) >60 mL/min    Comment: (NOTE) Calculated using the CKD-EPI Creatinine Equation (2021)    Anion gap 9 5 - 15    Comment: Performed at [facility], [place] [address] [place], [place] [address]  Magnesium     Status: None   Collection Time: 01/24/21  5:09 AM  Result Value Ref Range   Magnesium 1.9 1.7 - 2.4 mg/dL    Comment: Performed at [facility], [place] [address] [place], [place] [address]  Phosphorus     Status: None   Collection Time: 01/24/21  5:09 AM  Result Value Ref Range   Phosphorus 3.5 2.5 - 4.6 mg/dL    Comment: Performed at [facility], [place] [address] [place], [place] [address]  Glucose, capillary     Status: Abnormal   Collection Time: 01/24/21 11:39 AM  Result Value Ref Range   Glucose-Capillary 132 (H) 70 - 99 mg/dL    Comment: Glucose reference range applies only to samples taken after fasting for at least 8 hours.  Glucose, capillary     Status: Abnormal   Collection Time: 01/24/21  6:26 PM  Result Value Ref Range   Glucose-Capillary 130 (H) 70 - 99 mg/dL    Comment: Glucose reference range applies only to samples taken after fasting for at least 8 hours.  Glucose, capillary     Status: Abnormal   Collection Time: 01/25/21  1:40 AM  Result Value Ref Range   Glucose-Capillary 129 (H) 70 - 99 mg/dL    Comment: Glucose reference range applies only to samples taken after fasting for at least 8 hours.  Glucose, capillary      Status: None   Collection Time: 01/25/21  5:15 AM  Result Value Ref Range   Glucose-Capillary 89 70 - 99 mg/dL    Comment: Glucose reference range applies only to samples taken after fasting for at least 8 hours.  Protime-INR     Status: Abnormal  Collection Time: 01/25/21  5:16 AM  Result Value Ref Range   Prothrombin Time 26.2 (H) 11.4 - 15.2 seconds   INR 2.4 (H) 0.8 - 1.2    Comment: (NOTE) INR goal varies based on device and disease states. Performed at [facility], [place] [address] [place] [address], [place], [place] [address]   Basic metabolic panel     Status: Abnormal   Collection Time: 01/25/21  5:16 AM  Result Value Ref Range   Sodium 133 (L) 135 - 145 mmol/L   Potassium 4.4 3.5 - 5.1 mmol/L   Chloride 88 (L) 98 - 111 mmol/L   CO2 36 (H) 22 - 32 mmol/L   Glucose, Bld 94 70 - 99 mg/dL    Comment: Glucose reference range applies only to samples taken after fasting for at least 8 hours.   BUN 93 (H) 8 - 23 mg/dL   Creatinine, Ser 1.91 (H) 0.44 - 1.00 mg/dL   Calcium 8.7 (L) 8.9 - 10.3 mg/dL   GFR, Estimated 25 (L) >60 mL/min    Comment: (NOTE) Calculated using the CKD-EPI Creatinine Equation (2021)    Anion gap 9 5 - 15    Comment: Performed at [facility], [place] [address], [place], [place] [address]  Hemoglobin and hematocrit, blood     Status: Abnormal   Collection Time: 01/25/21  5:16 AM  Result Value Ref Range   Hemoglobin 8.7 (L) 12.0 - 15.0 g/dL   HCT 28.2 (L) 36.0 - 46.0 %    Comment: Performed at [facility], [place] [address] [place],  [address]   *Note: Due to a large number of results and/or encounters for the requested time period, some results have not been displayed. A complete set of results can be found in Results Review.    ECG   N/A  Telemetry   Paced with PVCs- Personally Reviewed  Radiology    VAS [place] UPPER EXTREMITY VENOUS DUPLEX  Result Date: 01/23/2021 UPPER VENOUS STUDY  Patient Name:   Marisal Bohac  Date of Exam:   01/23/2021 Medical Rec #: [id number]       Accession #:    [id number] Date of Birth: Jan 24, 1932       Patient Gender: F Patient Age:   88Y Exam Location:  [facility] Procedure:      VAS [place] UPPER EXTREMITY VENOUS DUPLEX Referring Phys: [id number] PARDEEP KUMAR --------------------------------------------------------------------------------  Indications: Pain, and Swelling Comparison Study: no prior Performing Technologist: Abram Sander RVS  Examination Guidelines: A complete evaluation includes B-mode imaging, spectral Doppler, color Doppler, and power Doppler as needed of all accessible portions of each vessel. Bilateral testing is considered an integral part of a complete examination. Limited examinations for reoccurring indications may be performed as noted.  Left Findings: +----------+------------+---------+-----------+----------+-------+ LEFT      CompressiblePhasicitySpontaneousPropertiesSummary +----------+------------+---------+-----------+----------+-------+ IJV           Full       Yes       Yes                      +----------+------------+---------+-----------+----------+-------+ Subclavian    Full       Yes       Yes                      +----------+------------+---------+-----------+----------+-------+ Axillary      Full       Yes       Yes                      +----------+------------+---------+-----------+----------+-------+  Brachial      Full       Yes       Yes                      +----------+------------+---------+-----------+----------+-------+ Radial        Full                                          +----------+------------+---------+-----------+----------+-------+ Ulnar         Full                                          +----------+------------+---------+-----------+----------+-------+ Cephalic      Full                                           +----------+------------+---------+-----------+----------+-------+ Basilic       Full                                          +----------+------------+---------+-----------+----------+-------+  Summary:  Left: No evidence of deep vein thrombosis in the upper extremity. No evidence of superficial vein thrombosis in the upper extremity. No evidence of thrombosis in the subclavian. Nonvascularized area of mixed echos measuring 3.57cm x 2.50cm in the proximal forearm. Etiology unknow.  *See table(s) above for measurements and observations.  Diagnosing physician: Harold Barban MD Electronically signed by Harold Barban MD on 01/23/2021 at 4:26:05 PM.    Final     Cardiac Studies   2D echo 12/2020 IMPRESSIONS  1. Left ventricular ejection fraction, by estimation, is 25 to 30%. The  left ventricle has severely decreased function. The left ventricle  demonstrates global hypokinesis. The left ventricular internal cavity size  was moderately dilated. There is mild  concentric left ventricular hypertrophy. Left ventricular diastolic  parameters are indeterminate.  2. Right ventricular systolic function is normal. The right ventricular  size is normal. There is severely elevated pulmonary artery systolic  pressure.  3. Left atrial size was severely dilated.  4. Right atrial size was severely dilated.  5. A small pericardial effusion is present. The pericardial effusion is  posterior to the left ventricle.  6. Mitral valve gradient essentially unchanged form 01/2020. The mitral  valve has been repaired/replaced. No evidence of mitral valve  regurgitation. No evidence of mitral stenosis. The mean mitral valve  gradient is 6.0 mmHg. There is a mechanical valve  present in the mitral position. Procedure Date: 2004.  7. Tricuspid valve regurgitation is mild to moderate.  8. The aortic valve has been repaired/replaced. Aortic valve  regurgitation is not visualized. No aortic stenosis is present.  There is a  mechanical valve present in the aortic position. Procedure Date: 2001.  Echo findings are consistent with normal  structure and function of the aortic valve prosthesis. Aortic valve area,  by VTI measures 1.32 cm. Aortic valve mean gradient measures 7.0 mmHg.  Aortic valve Vmax measures 2.00 m/s.  9. Aortic dilatation noted. There is mild dilatation of the ascending  aorta, measuring 38 mm.  10. The inferior vena cava  is dilated in size with <50% respiratory  variability, suggesting right atrial pressure of 15 mmHg.   Plan   Acute on chronic combined systolic and diastolic heart failure -She does not have prior h/o CAD, reportedly had a cath in [place] prior to AVR in 2001 that showed normal coronary arteries. -She is not a good cath candidate given renal dysfunction, recommend medical therapy with plan to repeat echo later.  -Initial chest x-ray showed vascular congestion.  -last cardiology note mentioned she was on 80 mg of Lasix +2.5 mg daily of metolazone, however patient did not remember taking the metolazone recently.  Based on her med list, it appears she had been on 80 mg twice daily of Lasix with no metolazone. -She has been placed on 40 mg twice daily of IV Lasix  -She put out 2.95L yesterday and is net neg 10.7L since admit.   -weights down 6lbs from yesterday  -She says her breathing is approaching baseline.    -SCr improving with diuresis (2.27>1.93>1.71>1.62>1.46) but bumped this am to 1.91>>likely overdiuresed -BNP remains elevated a 519 but this may be where she stays -continue Toprol XL 50mg  daily, Hydralazine to 10mg  TID and  Imdur 15mg  daily for LV dysfunction and titrate as BP allows -hold diuretics until renal function improves and then change to PO -cannot use ACE I/ARB/ARNi due to AKI  Persistent atrial fibrillation:  -Vpaced with PVCs on tele but cannot tell if she is in NSR -repeat 12 lead EKG>>ordered but no done -continue on 50mg  BID  metoprolol tartrate.  -On Coumadin for mechanical valve>>INR 2.4 today>>her goal is 2-2.5 per Dr. Tanna Furry notes in 2018 due to increased bruising on higher levels  Complete heart block  -s/p Medtronic BiV pacemaker 01/30/2019  Mechanical aortic and mitral valve replacement:  -On Coumadin -INR initially subtherapeutic -INR today therapeutic at 2.4  I have spent a total of 35 minutes with patient reviewing  telemetry, EKGs, labs and examining patient as well as establishing an assessment and plan that was discussed with the patient.  > 50% of time was spent in direct patient care.    Elza Varricchio 01/25/2021, 9:20 AM

## 2021-01-25 NOTE — Progress Notes (Signed)
ANTICOAGULATION CONSULT NOTE - Follow Up Consult  Pharmacy Consult for Warfarin Indication: mechanical aortic and mitral valve replacement, atrial fibrillation   Allergies  Allergen Reactions  . Penicillins Other (See Comments)    Bruise    . Aspirin Other (See Comments)    On coumadin  . Fentanyl Dermatitis    Rash with patch   . Niacin Itching   Patient Measurements: Height: 5\' 7"  (170.2 cm) Weight: 86.2 kg (190 lb) IBW/kg (Calculated) : 61.6  Vital Signs: Temp: 97.7 F (36.5 C) (05/03 0514) BP: 133/100 (05/03 0514) Pulse Rate: 61 (05/03 0514)  Labs: Recent Labs    01/23/21 0615 01/24/21 0509 01/25/21 0516  HGB 8.9*  --  8.7*  HCT 29.6*  --  28.2*  PLT 180  --   --   LABPROT 31.8* 31.2* 26.2*  INR 3.1* 3.0* 2.4*  CREATININE 1.62* 1.46* 1.91*   Estimated Creatinine Clearance: 23.8 mL/min (A) (by C-G formula based on SCr of 1.91 mg/dL (H)).  Assessment: 68 yoF admitted on 4/26 with acute respiratory failure.  PMH significant for CHF, COPD, complete heart block s/p defib pacemaker, CKD-IV, mechanical aortic valve replacement, mitral valve replacement, atrial fibrillation on chronic warfarin anticoagulation. INR on admission = 1.6. Patient initially placed on IV heparin due to NPO status. Warfarin resumed on 4/27.    Home warfarin dose reported by patient as 10mg  daily (last dose PTA on 4/25). However, per [facility] Notes, warfarin dose to have been 5mg  on M/F and 10mg  on all other days.   Heparin drip stopped 4/29 AM  01/25/2021   INR down to 2.4 today, in goal range   No bleeding reported, but significant bruising on medial side of L arm  LUE venous duplex negative for DVT  Hgb low/stable at 8.7, Pltc WNL (5/1)  Drug interactions: azithromycin 4/26-4/30, effects likely diminished now   Goal of Therapy:   INR 2-2.5 per outpatient notes based on Dr. Tanna Furry orders in 04/2017 due to significant bruising (per discussed with Dr. Radford Pax on 5/2-per MD,  use INR goal as designated by outpatient provider)  Monitor platelets by anticoagulation protocol: Yes   Plan:  Resume warfarin 10mg  today Daily PT/INR Monitor CBC Monitor closely for s/sx of bleeding or any worsening bruising   Peggyann Juba, PharmD, BCPS Pharmacy: [phone] 01/25/2021 7:05 AM

## 2021-01-25 NOTE — Progress Notes (Signed)
PROGRESS NOTE    [name] [name]  JAS:[id number] DOB: [birth year] DOA: 01/18/2021 PCP: Fayrene Helper, MD   Brief Narrative: This 87 years old female with PMH significant for COPD, chronic combined systolic and diastolic heart failure, complete heart block s/p [employer] pacemaker, AICD, A. fib, history of mechanical aortic valve on Coumadin, chronic kidney disease stage IIIb, hypothyroidism, seizure disorder presented in the ED with progressive shortness of breath.  Patient has received a dose of Retacrit prior to coming to the hospital. Patient was found to be lethargic on arrival in the ED with ABG showing pH of 7.2 , PCO2 of 80.  chest x-ray shows pulmonary congestion.  Patient was started on BiPAP, nebulization and IV Lasix.  Patient improved on BiPAP and for treatment for heart failure and COPD exacerbation. Patient is admitted for acute hypoxic and hypercapnic respiratory failure secondary to COPD and CHF exacerbation.  Assessment & Plan:   Principal Problem:   Acute respiratory failure with hypoxia and hypercapnia (HCC) Active Problems:   Hx of aortic valve replacement, mechanical   S/P MVR (mitral valve replacement)   Chronic anticoagulation, on coumadin for Mechanical AVR and MVR   S/P ICD (internal cardiac defibrillator) procedure   Persistent atrial fibrillation (HCC)   COPD with acute exacerbation (HCC)   Type 2 diabetes mellitus with hypoglycemia without coma (HCC)   Acute systolic CHF (congestive heart failure) (HCC)   Stage 3b chronic kidney disease (HCC)   Seizure (HCC)   Acute respiratory failure with hypoxia (HCC)   AKI (acute kidney injury) ([place])  Acute hypoxic and hypercapnic respiratory failure in the setting of COPD and CHFexacerbation. Patient presented with hypoxic and hypercapnic respiratory failure. She was lethargic on arrival with a pH of 7.2,  PCO2 of 80. Patient was initially placed on BiPAP and she improved. She is off BiPAP now. Continue Bipap as  needed. Continued with IV Lasix, Solu-Medrol and nebulization. She is 2.95L negative balance in the last 24 hours. So far she is 10.7 L negative since admission. Hold Lasix for today to see improvement in renal functions. Solu-Medrol changed to prednisone taper. Continue supplemental oxygen to keep saturation above 94%. Continue incentive spirometry. She seems back to her baseline breathing.  Acute on chronic combined systolic and diastolic heart failure: Continued with IV Lasix twice daily, monitor intake and output. Urine output 2.95 L in last 24 hours. Echo shows LVEF 25 to 30%. Cardiology input is appreciated.  Hold Lasix for today.  COPD exacerbation: Continue with nebulizer, Pulmicort. Continue supplemental oxygen.  AKI on CKD stage IIIb: Serum creatinine improving and almost backing to the baseline. 2.27> 2.93>1.71>1.62>1.46> but bumped this morning to 1.92> seems over diuresed. We will hold diuretics and monitor serum creatinine.  Acute metabolic encephalopathy: > Resolved Patient was lethargic on arrival secondary to PCO2 retention which has improved with BiPAP. Patient is alert and oriented, back to her baseline mental status, she is fully involved in conversation  History of mechanical aortic valve and mitral valve replacement on Coumadin INR therapeutic,  continue with Coumadin.  Hypothyroidism: Continue Synthroid.  History of seizure disorder: Continue Keppra  Hypokalemia: Resolved  Anemia of chronic disease:   monitor H&H. Hb stable 8.5 > 8.9  Left arm thrombophlebitis. Patient has developed significant left arm swelling at the IV site. IV infiltrated which was removed.  Significant bruising on the medial side of the arm. Venous duplex negative for DVT. Advised elevation and icing.  . Pressure Injury 07/08/20 Buttocks Right Stage 2 -  Partial thickness loss of dermis presenting as a shallow open injury with a red, pink wound bed without slough. 2 cm x  2cm (Active)  07/08/20 0731  Location: Buttocks  Location Orientation: Right  Staging: Stage 2 -  Partial thickness loss of dermis presenting as a shallow open injury with a red, pink wound bed without slough.  Wound Description (Comments): 2 cm x 2cm  Present on Admission: No     DVT prophylaxis: Heparin Code Status:  Full code. Family Communication: No family at bed side. Disposition Plan:   Status is: Inpatient  Remains inpatient appropriate because:Inpatient level of care appropriate due to severity of illness   Dispo: The patient is from: Home              Anticipated d/c is to: SNF on 5/4              Patient currently is not medically stable to d/c.   Difficult to place patient No  Consultants:    Cardiology  Procedures:  Antimicrobials:  Anti-infectives (From admission, onward)   Start     Dose/Rate Route Frequency Ordered Stop   01/20/21 1600  azithromycin (ZITHROMAX) tablet 500 mg        500 mg Oral Daily 01/20/21 0850 01/22/21 0933   01/18/21 2200  azithromycin (ZITHROMAX) 500 mg in sodium chloride 0.9 % 250 mL IVPB  Status:  Discontinued        500 mg 250 mL/hr over 60 Minutes Intravenous Every 24 hours 01/18/21 2136 01/20/21 0850      Subjective: Patient was seen and examined at bedside.  Overnight events noted.  Patient reports feeling much improved. She was sitting comfortably on the recliner.  Her left arm is pretty swollen and bruised, bruising is improving.  Objective: Vitals:   01/24/21 2103 01/25/21 0514 01/25/21 1016 01/25/21 1215  BP:  (!) 133/100 140/67   Pulse:  61 60   Resp:  19    Temp:  97.7 F (36.5 C)    TempSrc:      SpO2: 99% 100%  99%  Weight:  86.2 kg    Height:        Intake/Output Summary (Last 24 hours) at 01/25/2021 1228 Last data filed at 01/25/2021 0909 Gross per 24 hour  Intake 476 ml  Output 2550 ml  Net -2074 ml   Filed Weights   01/23/21 0416 01/24/21 0500 01/25/21 0514  Weight: 92.8 kg 89 kg 86.2 kg     Examination:  General exam: Appears calm and comfortable, not in any acute distress.  Respiratory system: Clear to auscultation. Respiratory effort normal. Cardiovascular system: S1 & S2 heard, RRR. No JVD, murmurs, rubs, gallops or clicks. No pedal edema. Gastrointestinal system: Abdomen is nondistended, soft and nontender. No organomegaly or masses felt.  Normal bowel sounds heard. Central nervous system: Alert and oriented. No focal neurological deficits. Extremities: Left arm swelling at IV site, bruising and soreness is improving. No edema, no cyanosis, no clubbing.  Skin: No rashes, lesions or ulcers Psychiatry: Judgement and insight appear normal. Mood & affect appropriate.     Data Reviewed: I have personally reviewed following labs and imaging studies  CBC: Recent Labs  Lab 01/18/21 1656 01/18/21 1945 01/19/21 1821 01/20/21 0238 01/21/21 0513 01/22/21 0439 01/23/21 0615 01/25/21 0516  WBC 5.4   < > 4.1 6.0 7.5 7.2 7.5  --   NEUTROABS 4.3  --   --   --   --   --   --   --  HGB 10.7*   < > 9.9* 8.5* 8.5* 8.5* 8.9* 8.7*  HCT 36.4   < > 33.5* 27.8* 27.9* 27.7* 29.6* 28.2*  MCV 92.6   < > 92.0 90.0 89.7 89.4 89.7  --   PLT 193   < > 169 159 157 168 180  --    < > = values in this interval not displayed.   Basic Metabolic Panel: Recent Labs  Lab 01/21/21 0513 01/22/21 0439 01/23/21 0615 01/24/21 0509 01/25/21 0516  NA 139 141 133* 138 133*  K 4.4 4.8 4.4 4.9 4.4  CL 97* 96* 90* 92* 88*  CO2 31 35* 35* 37* 36*  GLUCOSE 105* 117* 104* 103* 94  BUN 91* 93* 91* 88* 93*  CREATININE 1.93* 1.71* 1.62* 1.46* 1.91*  CALCIUM 8.9 9.4 8.9 9.3 8.7*  MG  --   --  1.8 1.9  --   PHOS  --   --  3.2 3.5  --    GFR: Estimated Creatinine Clearance: 23.8 mL/min (A) (by C-G formula based on SCr of 1.91 mg/dL (H)). Liver Function Tests: Recent Labs  Lab 01/18/21 1930 01/19/21 0718 01/23/21 0615  AST 32 41 34  ALT 20 25 29   ALKPHOS 38 46 39  BILITOT 0.4 0.4 0.4   PROT 5.7* 7.7 6.5  ALBUMIN 2.8* 3.7 3.3*   No results for input(s): LIPASE, AMYLASE in the last 168 hours. Recent Labs  Lab 01/19/21 0718  AMMONIA 19   Coagulation Profile: Recent Labs  Lab 01/21/21 0513 01/22/21 0439 01/23/21 0615 01/24/21 0509 01/25/21 0516  INR 2.9* 2.8* 3.1* 3.0* 2.4*   Cardiac Enzymes: No results for input(s): CKTOTAL, CKMB, CKMBINDEX, TROPONINI in the last 168 hours. BNP (last 3 results) No results for input(s): PROBNP in the last 8760 hours. HbA1C: No results for input(s): HGBA1C in the last 72 hours. CBG: Recent Labs  Lab 01/24/21 1139 01/24/21 1826 01/25/21 0140 01/25/21 0515 01/25/21 1138  GLUCAP 132* 130* 129* 89 105*   Lipid Profile: No results for input(s): CHOL, HDL, LDLCALC, TRIG, CHOLHDL, LDLDIRECT in the last 72 hours. Thyroid Function Tests: No results for input(s): TSH, T4TOTAL, FREET4, T3FREE, THYROIDAB in the last 72 hours. Anemia Panel: No results for input(s): VITAMINB12, FOLATE, FERRITIN, TIBC, IRON, RETICCTPCT in the last 72 hours. Sepsis Labs: No results for input(s): PROCALCITON, LATICACIDVEN in the last 168 hours.  Recent Results (from the past 240 hour(s))  Resp Panel by RT-PCR (Flu A&B, Covid) Nasopharyngeal Swab     Status: None   Collection Time: 01/18/21  5:08 PM   Specimen: Nasopharyngeal Swab; Nasopharyngeal(NP) swabs in vial transport medium  Result Value Ref Range Status   SARS Coronavirus 2 by RT PCR NEGATIVE NEGATIVE Final    Comment: (NOTE) SARS-CoV-2 target nucleic acids are NOT DETECTED.  The SARS-CoV-2 RNA is generally detectable in upper respiratory specimens during the acute phase of infection. The lowest concentration of SARS-CoV-2 viral copies this assay can detect is 138 copies/mL. A negative result does not preclude SARS-Cov-2 infection and should not be used as the sole basis for treatment or other patient management decisions. A negative result may occur with  improper specimen  collection/handling, submission of specimen other than nasopharyngeal swab, presence of viral mutation(s) within the areas targeted by this assay, and inadequate number of viral copies(<138 copies/mL). A negative result must be combined with clinical observations, patient history, and epidemiological information. The expected result is Negative.  Fact Sheet for Patients:  EntrepreneurPulse.com.au  Fact Sheet for  Healthcare Providers:  IncredibleEmployment.be  This test is no t yet approved or cleared by the [place] and  has been authorized for detection and/or diagnosis of SARS-CoV-2 by FDA under an Emergency Use Authorization (EUA). This EUA will remain  in effect (meaning this test can be used) for the duration of the COVID-19 declaration under Section 564(b)(1) of the Act, 21 U.S.C.section 360bbb-3(b)(1), unless the authorization is terminated  or revoked sooner.       Influenza A by PCR NEGATIVE NEGATIVE Final   Influenza B by PCR NEGATIVE NEGATIVE Final    Comment: (NOTE) The Xpert Xpress SARS-CoV-2/FLU/RSV plus assay is intended as an aid in the diagnosis of influenza from Nasopharyngeal swab specimens and should not be used as a sole basis for treatment. Nasal washings and aspirates are unacceptable for Xpert Xpress SARS-CoV-2/FLU/RSV testing.  Fact Sheet for Patients: EntrepreneurPulse.com.au  Fact Sheet for Healthcare Providers: IncredibleEmployment.be  This test is not yet approved or cleared by the [place] FDA and has been authorized for detection and/or diagnosis of SARS-CoV-2 by FDA under an Emergency Use Authorization (EUA). This EUA will remain in effect (meaning this test can be used) for the duration of the COVID-19 declaration under Section 564(b)(1) of the Act, 21 U.S.C. section 360bbb-3(b)(1), unless the authorization is terminated or revoked.  Performed at [facility], [place] [address] [place], [place] [address]   MRSA PCR Screening     Status: None   Collection Time: 01/18/21  9:58 PM   Specimen: Nasal Mucosa; Nasopharyngeal  Result Value Ref Range Status   MRSA by PCR NEGATIVE NEGATIVE Final    Comment:        The GeneXpert MRSA Assay (FDA approved for NASAL specimens only), is one component of a comprehensive MRSA colonization surveillance program. It is not intended to diagnose MRSA infection nor to guide or monitor treatment for MRSA infections. Performed at [facility], [place] [address], [place], [place] [address]     Radiology Studies: No results found.  Scheduled Meds: . budesonide (PULMICORT) nebulizer solution  0.5 mg Nebulization BID  . chlorhexidine  15 mL Mouth Rinse BID  . gabapentin  300 mg Oral BID  . hydrALAZINE  10 mg Oral Q8H  . ipratropium-albuterol  3 mL Nebulization BID  . isosorbide mononitrate  15 mg Oral Daily  . levETIRAcetam  500 mg Oral BID  . levothyroxine  88 mcg Oral QAC breakfast  . mouth rinse  15 mL Mouth Rinse q12n4p  . metoprolol succinate  50 mg Oral Daily  . warfarin  10 mg Oral ONCE-1600  . Warfarin - Pharmacist Dosing Inpatient   Does not apply q1600   Continuous Infusions:   LOS: 7 days    Time spent: 25 mins    Amaal Dimartino, MD [facility]   If 7PM-7AM, please contact night-coverage

## 2021-01-25 NOTE — Progress Notes (Signed)
Physical Therapy Treatment Patient Details Name: [name] [name] MRN: [id number] DOB: 1931/10/12 Today's Date: 01/25/2021    History of Present Illness Patient is an 87 year old female  PMH significant for COPD, chronic combined systolic and diastolic heart failure, complete heart block, A. fib status post pacemaker and ICD history of mechanical aortic valve on Coumadin, chronic kidney disease a stage III, hypothyroidism, seizure who presents with shortness of breath.  Pt admitted for Acute hypoxic hypercapnic respiratory failure; in the setting of COPD exacerbation and heart failure exacerbation    PT Comments    Progressing slowly with mobility. Pt remains weak and fatigues easily with activity. Continue to recommend SNF.    Follow Up Recommendations  SNF     Equipment Recommendations  None recommended by PT    Recommendations for Other Services       Precautions / Restrictions Precautions Precautions: Fall Restrictions Weight Bearing Restrictions: No    Mobility  Bed Mobility               General bed mobility comments: oob in recliner    Transfers Overall transfer level: Needs assistance Equipment used: Rolling walker (2 wheeled) Transfers: Sit to/from Stand   Stand pivot transfers: Mod assist       General transfer comment: Assist to power up, steady, control descent. Increased time. Cues for safety, technique. Increased time.  Ambulation/Gait   Gait Distance (Feet): 20 Feet Assistive device: Rolling walker (2 wheeled) Gait Pattern/deviations: Step-through pattern;Decreased stride length;Trunk flexed     General Gait Details: Assist to stabilize pt and manage/maneuver with RW. Cues for safety, posture, RW proximity. Remained on [place] O2. Pt fatigues easily. She is at risk for falls when ambulating.   Stairs             Wheelchair Mobility    Modified Rankin (Stroke Patients Only)       Balance Overall balance assessment: Needs assistance          Standing balance support: Bilateral upper extremity supported Standing balance-Leahy Scale: Poor                              Cognition Arousal/Alertness: Awake/alert Behavior During Therapy: WFL for tasks assessed/performed Overall Cognitive Status: Within Functional Limits for tasks assessed                                        Exercises      General Comments        Pertinent Vitals/Pain Pain Assessment: Faces Faces Pain Scale: Hurts little more Pain Location: L UE Pain Descriptors / Indicators: Sore;Discomfort Pain Intervention(s): Limited activity within patient's tolerance;Monitored during session    Home Living                      Prior Function            PT Goals (current goals can now be found in the care plan section) Progress towards PT goals: Progressing toward goals    Frequency    Min 2X/week      PT Plan Current plan remains appropriate    Co-evaluation              AM-PAC PT "6 Clicks" Mobility   Outcome Measure  Help needed turning from your back to your side while in a  flat bed without using bedrails?: A Little Help needed moving from lying on your back to sitting on the side of a flat bed without using bedrails?: A Little Help needed moving to and from a bed to a chair (including a wheelchair)?: A Lot Help needed standing up from a chair using your arms (e.g., wheelchair or bedside chair)?: A Lot Help needed to walk in hospital room?: A Lot Help needed climbing 3-5 steps with a railing? : Total 6 Click Score: 13    End of Session Equipment Utilized During Treatment: Gait belt;Oxygen Activity Tolerance: Patient limited by fatigue Patient left: in chair;with call bell/phone within reach (no chair alarm box in room. pt sitting in chair prior to PT session)   PT Visit Diagnosis: Unsteadiness on feet (R26.81);Difficulty in walking, not elsewhere classified (R26.2);Muscle weakness  (generalized) (M62.81)     Time: 7121-9758 PT Time Calculation (min) (ACUTE ONLY): 20 min  Charges:  $Gait Training: 8-22 mins                         Doreatha Massed, PT Acute Rehabilitation  Office: [phone] Pager: [phone]

## 2021-01-26 ENCOUNTER — Other Ambulatory Visit: Payer: Medicare Other | Admitting: Nurse Practitioner

## 2021-01-26 DIAGNOSIS — J9601 Acute respiratory failure with hypoxia: Secondary | ICD-10-CM | POA: Diagnosis not present

## 2021-01-26 DIAGNOSIS — J9602 Acute respiratory failure with hypercapnia: Secondary | ICD-10-CM | POA: Diagnosis not present

## 2021-01-26 LAB — BASIC METABOLIC PANEL
Anion gap: 9 (ref 5–15)
BUN: 87 mg/dL — ABNORMAL HIGH (ref 8–23)
CO2: 34 mmol/L — ABNORMAL HIGH (ref 22–32)
Calcium: 8.7 mg/dL — ABNORMAL LOW (ref 8.9–10.3)
Chloride: 91 mmol/L — ABNORMAL LOW (ref 98–111)
Creatinine, Ser: 1.63 mg/dL — ABNORMAL HIGH (ref 0.44–1.00)
GFR, Estimated: 31 mL/min — ABNORMAL LOW (ref >60)
Glucose, Bld: 94 mg/dL (ref 70–99)
Potassium: 4.6 mmol/L (ref 3.5–5.1)
Sodium: 134 mmol/L — ABNORMAL LOW (ref 135–145)

## 2021-01-26 LAB — GLUCOSE, CAPILLARY
Glucose-Capillary: 111 mg/dL — ABNORMAL HIGH (ref 70–99)
Glucose-Capillary: 119 mg/dL — ABNORMAL HIGH (ref 70–99)
Glucose-Capillary: 123 mg/dL — ABNORMAL HIGH (ref 70–99)
Glucose-Capillary: 85 mg/dL (ref 70–99)
Glucose-Capillary: 96 mg/dL (ref 70–99)

## 2021-01-26 LAB — CBC
HCT: 27.6 % — ABNORMAL LOW (ref 36.0–46.0)
Hemoglobin: 8.5 g/dL — ABNORMAL LOW (ref 12.0–15.0)
MCH: 27.7 pg (ref 26.0–34.0)
MCHC: 30.8 g/dL (ref 30.0–36.0)
MCV: 89.9 fL (ref 80.0–100.0)
Platelets: 173 10*3/uL (ref 150–400)
RBC: 3.07 MIL/uL — ABNORMAL LOW (ref 3.87–5.11)
RDW: 20.7 % — ABNORMAL HIGH (ref 11.5–15.5)
WBC: 8.7 10*3/uL (ref 4.0–10.5)
nRBC: 0.2 % (ref 0.0–0.2)

## 2021-01-26 LAB — PROTIME-INR
INR: 1.7 — ABNORMAL HIGH (ref 0.8–1.2)
Prothrombin Time: 20.3 seconds — ABNORMAL HIGH (ref 11.4–15.2)

## 2021-01-26 MED ORDER — WARFARIN SODIUM 5 MG PO TABS
10.0000 mg | ORAL_TABLET | Freq: Once | ORAL | Status: AC
  Administered 2021-01-26: 10 mg via ORAL
  Filled 2021-01-26: qty 2

## 2021-01-26 NOTE — Progress Notes (Signed)
Occupational Therapy Progress Note  Upon arrival patient seated in chair, reporting L UE hurts and is visibly swollen. [place] taken 5/1 negative for acute DVT. Patient instructed in L UE A/AAROM exercises to promote circulation as well as educated in elevation of UE. Patient needing min cues for adequate technique (exercises listed below). Patient needing mod A to power up to standing from recliner with difficulty extending at knees/hips. Patient tolerate standing for UE exercises elbow flexion/extension ~1 min and mod A for eccentric control back into chair. Continue to recommend ST rehab at D/C due to weakness, limited activity tolerance and independence with ADLs.    01/26/21 1300  OT Visit Information  Last OT Received On 01/26/21  Assistance Needed +1  History of Present Illness Patient is an 87 year old female who presents with shortness of breath.  Pt admitted for Acute hypoxic hypercapnic respiratory failure; in the setting of COPD exacerbation and heart failure exacerbation. Note L UE swelling, [place] completed negative for DVT on 5/1. PMH significant for COPD, chronic combined systolic and diastolic heart failure, complete heart block, A. fib status post pacemaker and ICD history of mechanical aortic valve on Coumadin, chronic kidney disease a stage III, hypothyroidism, seizure  Precautions  Precautions Fall  Precaution Comments monitor VS  Pain Assessment  Pain Assessment Faces  Faces Pain Scale 4  Pain Location L UE  Pain Descriptors / Indicators Sore;Tender;Other (Comment) (swollen)  Pain Intervention(s) Monitored during session  Cognition  Arousal/Alertness Awake/alert  Behavior During Therapy WFL for tasks assessed/performed  Overall Cognitive Status No family/caregiver present to determine baseline cognitive functioning  General Comments patient unable to recall exactly when L arm started swelling "a few days ago, I have trouble keeping track of everything"  Upper Extremity  Assessment  Upper Extremity Assessment LUE deficits/detail  LUE Deficits / Details note significant edema in L hand, forearm  ADL  Overall ADL's  Needs assistance/impaired  Toilet Transfer Moderate assistance;RW  Toilet Transfer Details (indicate cue type and reason) sit to stand from recliner needing mod A to power up and steady  Bed Mobility  General bed mobility comments oob in recliner  Balance  Overall balance assessment Needs assistance  Standing balance support Single extremity supported  Standing balance-Leahy Scale Poor  Standing balance comment reliant on external assist and UE support of walker  Transfers  Overall transfer level Needs assistance  Equipment used Rolling walker (2 wheeled)  Transfers Sit to/from Stand  Sit to Stand Mod assist  General transfer comment mod A to power up to standing as well as for eccentric control back to recliner  Exercises  Exercises General Upper Extremity;Other exercises  General Exercises - Upper Extremity  Wrist Flexion AAROM;Left;10 reps;Seated  Wrist Extension AAROM;Left;10 reps;Seated  Digit Composite Flexion AROM;Left;10 reps;Seated  Composite Extension AROM;Left;10 reps;Seated  Other Exercises  Other Exercises x10 towel slides forward flexion. elevated patient L UE and encourage patient to perform fist pumps throughout the day to promote circulation for edema management  OT - End of Session  Equipment Utilized During Treatment Rolling walker;Oxygen  Activity Tolerance Patient tolerated treatment well  Patient left in chair;with call bell/phone within reach  Nurse Communication Mobility status  OT Assessment/Plan  OT Plan Discharge plan remains appropriate  OT Visit Diagnosis Unsteadiness on feet (R26.81);Muscle weakness (generalized) (M62.81);Pain  Pain - Right/Left Left  Pain - part of body Arm  OT Frequency (ACUTE ONLY) Min 2X/week  Follow Up Recommendations SNF  OT Equipment None recommended by OT  AM-PAC OT "6 Clicks"  Daily Activity Outcome Measure (Version 2)  Help from another person eating meals? 3  Help from another person taking care of personal grooming? 3  Help from another person toileting, which includes using toliet, bedpan, or urinal? 2  Help from another person bathing (including washing, rinsing, drying)? 2  Help from another person to put on and taking off regular upper body clothing? 2  Help from another person to put on and taking off regular lower body clothing? 2  6 Click Score 14  OT Goal Progression  Progress towards OT goals Progressing toward goals  Acute Rehab OT Goals  Patient Stated Goal get moving  OT Goal Formulation With patient  Time For Goal Achievement 02/03/21  Potential to Achieve Goals Good  ADL Goals  Pt Will Perform Grooming with supervision;standing  Pt Will Transfer to Toilet with supervision;ambulating;bedside commode (walker)  Pt Will Perform Toileting - Clothing Manipulation and hygiene with supervision;sit to/from stand;sitting/lateral leans  OT Time Calculation  OT Start Time (ACUTE ONLY) 1050  OT Stop Time (ACUTE ONLY) 1114  OT Time Calculation (min) 24 min  OT General Charges  $OT Visit 1 Visit  OT Treatments  $Therapeutic Exercise 23-37 mins   Delbert Phenix OT OT pager: [phone]

## 2021-01-26 NOTE — Progress Notes (Signed)
PROGRESS NOTE    [name] [name]  MEQ:[id number] DOB: 09/03/1932 DOA: 01/18/2021 PCP: Fayrene Helper, MD   Brief Narrative: This 87 years old female with PMH significant for COPD, chronic combined systolic and diastolic heart failure, complete heart block s/p Medtronic pacemaker, AICD, A. fib, history of mechanical aortic valve on Coumadin, chronic kidney disease stage IIIb, hypothyroidism, seizure disorder presented in the ED with progressive shortness of breath.  Patient has received a dose of Retacrit prior to coming to the hospital. Patient was found to be lethargic on arrival in the ED with ABG showing pH of 7.2 , PCO2 of 80.  chest x-ray shows pulmonary congestion.  Patient was started on BiPAP, nebulization and IV Lasix.  Patient improved on BiPAP and for treatment for heart failure and COPD exacerbation. Patient is admitted for acute hypoxic and hypercapnic respiratory failure secondary to COPD and CHF exacerbation.  Assessment & Plan:   Principal Problem:   Acute respiratory failure with hypoxia and hypercapnia (HCC) Active Problems:   Hx of aortic valve replacement, mechanical   S/P MVR (mitral valve replacement)   Chronic anticoagulation, on coumadin for Mechanical AVR and MVR   S/P ICD (internal cardiac defibrillator) procedure   Persistent atrial fibrillation (HCC)   COPD with acute exacerbation (HCC)   Type 2 diabetes mellitus with hypoglycemia without coma (HCC)   Acute systolic CHF (congestive heart failure) (HCC)   Stage 3b chronic kidney disease (HCC)   Seizure (HCC)   Acute respiratory failure with hypoxia (HCC)   AKI (acute kidney injury) ([place])  Acute hypoxic and hypercapnic respiratory failure in the setting of COPD and CHFexacerbation. Patient presented with hypoxic and hypercapnic respiratory failure. She was lethargic on arrival with a pH of 7.2,  PCO2 of 80. Patient was initially placed on BiPAP and she improved. She is off BiPAP now. Continue Bipap as  needed. Continued with IV Lasix, Solu-Medrol and nebulization. She is 1.5L negative balance in the last 24 hours. So far she is 11.5 L negative balance since admission. Lasix was held yesterday. Serum creatinine improved to 1.63 Solu-Medrol changed to prednisone taper. Continue supplemental oxygen to keep saturation above 94%. Continue incentive spirometry. She seems back to her baseline breathing.  Acute on chronic combined systolic and diastolic heart failure: Continued with IV Lasix twice daily, monitor intake and output. Urine output 1.5 L in last 24 hours. Echo shows LVEF 25 to 30%. Cardiology input is appreciated.  Lasix was held yesterday serum creatinine has improved. Cardiology recommended resuming Lasix 40 p.o. daily alternate with 40 twice daily.  COPD exacerbation: Continue with nebulizer, Pulmicort. Continue supplemental oxygen.  AKI on CKD stage IIIb: Serum creatinine improving and almost backing to the baseline. 2.27> 2.93>1.71>1.62>1.46> but bumped this morning to 1.92> seems over diuresed. We will hold diuretics and monitor serum creatinine.  Serum creatinine improved to 1.63.  Acute metabolic encephalopathy: > Resolved Patient was lethargic on arrival secondary to PCO2 retention which has improved with BiPAP. Patient is alert and oriented, back to her baseline mental status, she is fully involved in conversation  History of mechanical aortic valve and mitral valve replacement on Coumadin INR subtherapeutic 1.7 ,  continue with Coumadin. Discussed with pharmacy, states Coumadin was held 1 day on 5/2 No need for heparinization, INR should be therapeutic tomorrow.  Hypothyroidism: Continue Synthroid.  History of seizure disorder: Continue Keppra  Hypokalemia: Resolved  Anemia of chronic disease:   monitor H&H. Hb stable 8.5 > 8.9  Left arm thrombophlebitis. Patient  has developed significant left arm swelling at the IV site. IV infiltrated which was  removed.  Significant bruising on the medial side of the arm. Venous duplex negative for DVT. Advised elevation and icing.  . Pressure Injury 07/08/20 Buttocks Right Stage 2 -  Partial thickness loss of dermis presenting as a shallow open injury with a red, pink wound bed without slough. 2 cm x 2cm (Active)  07/08/20 0731  Location: Buttocks  Location Orientation: Right  Staging: Stage 2 -  Partial thickness loss of dermis presenting as a shallow open injury with a red, pink wound bed without slough.  Wound Description (Comments): 2 cm x 2cm  Present on Admission: No     DVT prophylaxis: Heparin Code Status:  Full code. Family Communication: No family at bed side. Disposition Plan:   Status is: Inpatient  Remains inpatient appropriate because:Inpatient level of care appropriate due to severity of illness   Dispo: The patient is from: Home              Anticipated d/c is to: SNF on 5/5              Patient currently is not medically stable to d/c.   Difficult to place patient No  Consultants:    Cardiology  Procedures:  Antimicrobials:  Anti-infectives (From admission, onward)   Start     Dose/Rate Route Frequency Ordered Stop   01/20/21 1600  azithromycin (ZITHROMAX) tablet 500 mg        500 mg Oral Daily 01/20/21 0850 01/22/21 0933   01/18/21 2200  azithromycin (ZITHROMAX) 500 mg in sodium chloride 0.9 % 250 mL IVPB  Status:  Discontinued        500 mg 250 mL/hr over 60 Minutes Intravenous Every 24 hours 01/18/21 2136 01/20/21 0850      Subjective: Patient was seen and examined at bedside.  Overnight events noted.  Patient reports feeling much improved. She was sitting comfortably on the recliner.  Her left arm is swollen and bruised but bruising is improving.  Objective: Vitals:   01/26/21 0423 01/26/21 0500 01/26/21 0807 01/26/21 1320  BP: 123/62   121/69  Pulse: (!) 59   60  Resp: 18   17  Temp: 98.4 F (36.9 C)   97.6 F (36.4 C)  TempSrc:    Oral   SpO2: 100%  99% 100%  Weight:  88.3 kg    Height:        Intake/Output Summary (Last 24 hours) at 01/26/2021 1343 Last data filed at 01/26/2021 1153 Gross per 24 hour  Intake 944 ml  Output 1750 ml  Net -806 ml   Filed Weights   01/24/21 0500 01/25/21 0514 01/26/21 0500  Weight: 89 kg 86.2 kg 88.3 kg    Examination:  General exam: Appears calm and comfortable, not in any acute distress.  Respiratory system: Clear to auscultation. Respiratory effort normal. Cardiovascular system: S1 & S2 heard, RRR. No JVD, murmurs, rubs, gallops or clicks. No pedal edema. Gastrointestinal system: Abdomen is nondistended, soft and nontender. No organomegaly or masses felt.  Normal bowel sounds heard. Central nervous system: Alert and oriented. No focal neurological deficits. Extremities: Left arm swelling at IV site, bruising and soreness is improving. No edema, no cyanosis, no clubbing.  Skin: No rashes, lesions or ulcers Psychiatry: Judgement and insight appear normal. Mood & affect appropriate.     Data Reviewed: I have personally reviewed following labs and imaging studies  CBC: Recent Labs  Lab  01/20/21 0238 01/21/21 4818 01/22/21 0439 01/23/21 0615 01/25/21 0516 01/26/21 0504  WBC 6.0 7.5 7.2 7.5  --  8.7  HGB 8.5* 8.5* 8.5* 8.9* 8.7* 8.5*  HCT 27.8* 27.9* 27.7* 29.6* 28.2* 27.6*  MCV 90.0 89.7 89.4 89.7  --  89.9  PLT 159 157 168 180  --  563   Basic Metabolic Panel: Recent Labs  Lab 01/22/21 0439 01/23/21 0615 01/24/21 0509 01/25/21 0516 01/26/21 0504  NA 141 133* 138 133* 134*  K 4.8 4.4 4.9 4.4 4.6  CL 96* 90* 92* 88* 91*  CO2 35* 35* 37* 36* 34*  GLUCOSE 117* 104* 103* 94 94  BUN 93* 91* 88* 93* 87*  CREATININE 1.71* 1.62* 1.46* 1.91* 1.63*  CALCIUM 9.4 8.9 9.3 8.7* 8.7*  MG  --  1.8 1.9  --   --   PHOS  --  3.2 3.5  --   --    GFR: Estimated Creatinine Clearance: 28.3 mL/min (A) (by C-G formula based on SCr of 1.63 mg/dL (H)). Liver Function Tests: Recent  Labs  Lab 01/23/21 0615  AST 34  ALT 29  ALKPHOS 39  BILITOT 0.4  PROT 6.5  ALBUMIN 3.3*   No results for input(s): LIPASE, AMYLASE in the last 168 hours. No results for input(s): AMMONIA in the last 168 hours. Coagulation Profile: Recent Labs  Lab 01/22/21 0439 01/23/21 0615 01/24/21 0509 01/25/21 0516 01/26/21 0504  INR 2.8* 3.1* 3.0* 2.4* 1.7*   Cardiac Enzymes: No results for input(s): CKTOTAL, CKMB, CKMBINDEX, TROPONINI in the last 168 hours. BNP (last 3 results) No results for input(s): PROBNP in the last 8760 hours. HbA1C: No results for input(s): HGBA1C in the last 72 hours. CBG: Recent Labs  Lab 01/25/21 1138 01/25/21 1723 01/26/21 0003 01/26/21 0530 01/26/21 1223  GLUCAP 105* 85 111* 96 123*   Lipid Profile: No results for input(s): CHOL, HDL, LDLCALC, TRIG, CHOLHDL, LDLDIRECT in the last 72 hours. Thyroid Function Tests: No results for input(s): TSH, T4TOTAL, FREET4, T3FREE, THYROIDAB in the last 72 hours. Anemia Panel: No results for input(s): VITAMINB12, FOLATE, FERRITIN, TIBC, IRON, RETICCTPCT in the last 72 hours. Sepsis Labs: No results for input(s): PROCALCITON, LATICACIDVEN in the last 168 hours.  Recent Results (from the past 240 hour(s))  Resp Panel by RT-PCR (Flu A&B, Covid) Nasopharyngeal Swab     Status: None   Collection Time: 01/18/21  5:08 PM   Specimen: Nasopharyngeal Swab; Nasopharyngeal(NP) swabs in vial transport medium  Result Value Ref Range Status   SARS Coronavirus 2 by RT PCR NEGATIVE NEGATIVE Final    Comment: (NOTE) SARS-CoV-2 target nucleic acids are NOT DETECTED.  The SARS-CoV-2 RNA is generally detectable in upper respiratory specimens during the acute phase of infection. The lowest concentration of SARS-CoV-2 viral copies this assay can detect is 138 copies/mL. A negative result does not preclude SARS-Cov-2 infection and should not be used as the sole basis for treatment or other patient management decisions. A  negative result may occur with  improper specimen collection/handling, submission of specimen other than nasopharyngeal swab, presence of viral mutation(s) within the areas targeted by this assay, and inadequate number of viral copies(<138 copies/mL). A negative result must be combined with clinical observations, patient history, and epidemiological information. The expected result is Negative.  Fact Sheet for Patients:  EntrepreneurPulse.com.au  Fact Sheet for Healthcare Providers:  IncredibleEmployment.be  This test is no t yet approved or cleared by the [place] and  has been authorized for  detection and/or diagnosis of SARS-CoV-2 by FDA under an Emergency Use Authorization (EUA). This EUA will remain  in effect (meaning this test can be used) for the duration of the COVID-19 declaration under Section 564(b)(1) of the Act, 21 U.S.C.section 360bbb-3(b)(1), unless the authorization is terminated  or revoked sooner.       Influenza A by PCR NEGATIVE NEGATIVE Final   Influenza B by PCR NEGATIVE NEGATIVE Final    Comment: (NOTE) The Xpert Xpress SARS-CoV-2/FLU/RSV plus assay is intended as an aid in the diagnosis of influenza from Nasopharyngeal swab specimens and should not be used as a sole basis for treatment. Nasal washings and aspirates are unacceptable for Xpert Xpress SARS-CoV-2/FLU/RSV testing.  Fact Sheet for Patients: EntrepreneurPulse.com.au  Fact Sheet for Healthcare Providers: IncredibleEmployment.be  This test is not yet approved or cleared by the [place] FDA and has been authorized for detection and/or diagnosis of SARS-CoV-2 by FDA under an Emergency Use Authorization (EUA). This EUA will remain in effect (meaning this test can be used) for the duration of the COVID-19 declaration under Section 564(b)(1) of the Act, 21 U.S.C. section 360bbb-3(b)(1), unless the authorization  is terminated or revoked.  Performed at [facility], [place] [address], [place], [place] [address]   MRSA PCR Screening     Status: None   Collection Time: 01/18/21  9:58 PM   Specimen: Nasal Mucosa; Nasopharyngeal  Result Value Ref Range Status   MRSA by PCR NEGATIVE NEGATIVE Final    Comment:        The GeneXpert MRSA Assay (FDA approved for NASAL specimens only), is one component of a comprehensive MRSA colonization surveillance program. It is not intended to diagnose MRSA infection nor to guide or monitor treatment for MRSA infections. Performed at [facility], [place] [address] [place], [place] [address]     Radiology Studies: No results found.  Scheduled Meds: . budesonide (PULMICORT) nebulizer solution  0.5 mg Nebulization BID  . chlorhexidine  15 mL Mouth Rinse BID  . gabapentin  300 mg Oral BID  . hydrALAZINE  10 mg Oral Q8H  . ipratropium-albuterol  3 mL Nebulization BID  . isosorbide mononitrate  15 mg Oral Daily  . levETIRAcetam  500 mg Oral BID  . levothyroxine  88 mcg Oral QAC breakfast  . mouth rinse  15 mL Mouth Rinse q12n4p  . metoprolol succinate  50 mg Oral Daily  . warfarin  10 mg Oral ONCE-1600  . Warfarin - Pharmacist Dosing Inpatient   Does not apply q1600   Continuous Infusions:   LOS: 8 days    Time spent: 25 mins    Shakiah Wester, MD [facility]   If 7PM-7AM, please contact night-coverage

## 2021-01-26 NOTE — Telephone Encounter (Signed)
Dr. Lovena Le made aware 01/25/21.

## 2021-01-26 NOTE — Progress Notes (Signed)
ANTICOAGULATION CONSULT NOTE - Follow Up Consult  Pharmacy Consult for warfarin Indication: atrial fibrillation, mechanical aortic and mitral valve replacement  Allergies  Allergen Reactions  . Penicillins Other (See Comments)    Bruise    . Aspirin Other (See Comments)    On coumadin  . Fentanyl Dermatitis    Rash with patch   . Niacin Itching    Patient Measurements: Height: 5\' 7"  (170.2 cm) Weight: 88.3 kg (194 lb 10.7 oz) IBW/kg (Calculated) : 61.6 Heparin Dosing Weight:   Vital Signs: Temp: 98.4 F (36.9 C) (05/04 0423) BP: 123/62 (05/04 0423) Pulse Rate: 59 (05/04 0423)  Labs: Recent Labs    01/24/21 0509 01/25/21 0516 01/26/21 0504  HGB  --  8.7* 8.5*  HCT  --  28.2* 27.6*  PLT  --   --  173  LABPROT 31.2* 26.2* 20.3*  INR 3.0* 2.4* 1.7*  CREATININE 1.46* 1.91* 1.63*    Estimated Creatinine Clearance: 28.3 mL/min (A) (by C-G formula based on SCr of 1.63 mg/dL (H)).   Medications:  - PTA warfarin regimen: 10mg  daily except 5 mg on Mon and Fri per [facility] clinic's note, but pt reported taking 10mg  daily (last dose taken on 4/25)  Assessment: Patient's an 87 y.o with hx HF, afib and mechanical aortic and mitral valve replacement on warfarin PTA presented to the ED on 4/26 with c/o of SOB.  Warfarin resumed on 4/27.  Today, 01/26/2021: - INR is subtherapeutic at 1.7 (dose held on 5/2 d/t elevated INR) - cbc stable - no bleeding documented - cardiac diet - no significant drug-drug intxns noted   Goal of Therapy:  INR 2-2.5 per outpatient notes based on Dr. Tanna Furry orders in 04/2017 due to significant bruising (per discussed with Dr. Radford Pax on 5/2-per MD, use INR goal as designated by outpatient provider) Monitor platelets by anticoagulation protocol: Yes   Plan:  - warfarin 10mg  PO x1 today - daily INR - monitor for s/sx bleeding  Raheim Beutler P 01/26/2021,7:18 AM

## 2021-01-26 NOTE — Progress Notes (Signed)
[facility]) Hospital Liaison note:  This patient is currently enrolled in [facility] outpatient-based Palliative Care. Will continue to follow for disposition.  Please call with any outpatient palliative questions or concerns.  Thank you, Lorelee Market, LPN [facility] Liaison [phone]

## 2021-01-26 NOTE — Progress Notes (Signed)
Progress Note  Patient Name: [name] [name] Date of Encounter: 01/26/2021  [facility] Cardiologist: Sanda Klein, MD  Subjective   Feeling well. No chest pain, sob or palpitations.   Inpatient Medications    Scheduled Meds: . budesonide (PULMICORT) nebulizer solution  0.5 mg Nebulization BID  . chlorhexidine  15 mL Mouth Rinse BID  . gabapentin  300 mg Oral BID  . hydrALAZINE  10 mg Oral Q8H  . ipratropium-albuterol  3 mL Nebulization BID  . isosorbide mononitrate  15 mg Oral Daily  . levETIRAcetam  500 mg Oral BID  . levothyroxine  88 mcg Oral QAC breakfast  . mouth rinse  15 mL Mouth Rinse q12n4p  . metoprolol succinate  50 mg Oral Daily  . warfarin  10 mg Oral ONCE-1600  . Warfarin - Pharmacist Dosing Inpatient   Does not apply q1600   Continuous Infusions:  PRN Meds: acetaminophen **OR** acetaminophen, hydrALAZINE, ondansetron, senna-docusate   Vital Signs    Vitals:   01/25/21 2007 01/26/21 0423 01/26/21 0500 01/26/21 0807  BP: 135/61 123/62    Pulse: (!) 59 (!) 59    Resp: 18 18    Temp: 97.6 F (36.4 C) 98.4 F (36.9 C)    TempSrc:      SpO2: 94% 100%  99%  Weight:   88.3 kg   Height:        Intake/Output Summary (Last 24 hours) at 01/26/2021 1145 Last data filed at 01/26/2021 1045 Gross per 24 hour  Intake 708 ml  Output 1750 ml  Net -1042 ml   Last 3 Weights 01/26/2021 01/25/2021 01/24/2021  Weight (lbs) 194 lb 10.7 oz 190 lb 196 lb 3.4 oz  Weight (kg) 88.3 kg 86.183 kg 89 kg      Telemetry    Paced rhythm -  Personally Reviewed  ECG    N/A  Physical Exam   GEN: No acute distress.   Neck: No JVD Cardiac: RRR, no murmurs, rubs, or gallops.  Respiratory: Clear to auscultation bilaterally. GI: Soft, nontender, non-distended  MS: No edema; No deformity. Neuro:  Nonfocal  Psych: Normal affect   Labs    High Sensitivity Troponin:   Recent Labs  Lab 01/18/21 1656 01/18/21 1930  TROPONINIHS 28* 37*      Chemistry Recent Labs   Lab 01/23/21 0615 01/24/21 0509 01/25/21 0516 01/26/21 0504  NA 133* 138 133* 134*  K 4.4 4.9 4.4 4.6  CL 90* 92* 88* 91*  CO2 35* 37* 36* 34*  GLUCOSE 104* 103* 94 94  BUN 91* 88* 93* 87*  CREATININE 1.62* 1.46* 1.91* 1.63*  CALCIUM 8.9 9.3 8.7* 8.7*  PROT 6.5  --   --   --   ALBUMIN 3.3*  --   --   --   AST 34  --   --   --   ALT 29  --   --   --   ALKPHOS 39  --   --   --   BILITOT 0.4  --   --   --   GFRNONAA 31* 35* 25* 31*  ANIONGAP 8 9 9 9      Hematology Recent Labs  Lab 01/22/21 0439 01/23/21 0615 01/25/21 0516 01/26/21 0504  WBC 7.2 7.5  --  8.7  RBC 3.10* 3.30*  --  3.07*  HGB 8.5* 8.9* 8.7* 8.5*  HCT 27.7* 29.6* 28.2* 27.6*  MCV 89.4 89.7  --  89.9  MCH 27.4 27.0  --  27.7  MCHC 30.7 30.1  --  30.8  RDW 19.3* 19.5*  --  20.7*  PLT 168 180  --  173    BNP Recent Labs  Lab 01/23/21 0615  BNP 519.3*     DDimer No results for input(s): DDIMER in the last 168 hours.   Radiology    No results found.  Cardiac Studies   2D echo 12/2020 IMPRESSIONS  1. Left ventricular ejection fraction, by estimation, is 25 to 30%. The  left ventricle has severely decreased function. The left ventricle  demonstrates global hypokinesis. The left ventricular internal cavity size  was moderately dilated. There is mild  concentric left ventricular hypertrophy. Left ventricular diastolic  parameters are indeterminate.  2. Right ventricular systolic function is normal. The right ventricular  size is normal. There is severely elevated pulmonary artery systolic  pressure.  3. Left atrial size was severely dilated.  4. Right atrial size was severely dilated.  5. A small pericardial effusion is present. The pericardial effusion is  posterior to the left ventricle.  6. Mitral valve gradient essentially unchanged form 01/2020. The mitral  valve has been repaired/replaced. No evidence of mitral valve  regurgitation. No evidence of mitral stenosis. The mean mitral  valve  gradient is 6.0 mmHg. There is a mechanical valve  present in the mitral position. Procedure Date: 2004.  7. Tricuspid valve regurgitation is mild to moderate.  8. The aortic valve has been repaired/replaced. Aortic valve  regurgitation is not visualized. No aortic stenosis is present. There is a  mechanical valve present in the aortic position. Procedure Date: 2001.  Echo findings are consistent with normal  structure and function of the aortic valve prosthesis. Aortic valve area,  by VTI measures 1.32 cm. Aortic valve mean gradient measures 7.0 mmHg.  Aortic valve Vmax measures 2.00 m/s.  9. Aortic dilatation noted. There is mild dilatation of the ascending  aorta, measuring 38 mm.  10. The inferior vena cava is dilated in size with <50% respiratory  variability, suggesting right atrial pressure of 15 mmHg.   Patient Profile     87 y.o. female with a hx of persistent atrial fibrillation,CHB s/pMedtronic BiV pacemaker 01/30/2019, COPD on2L/minhome O2,HLD,DM2, hypothyroidism, chronic combined systolic and diastolic heart failure, and aortic and mitral valve replacement with mechanical valvewho is being seen for the evaluation of acute on chronic combined CHFat the request of Dr. Tyrell Antonio.  Assessment & Plan    1. Acute on chronic combined CHF: - Initial chest x-ray showed vascular congestion. -last cardiology note mentioned she was on 80 mg of Lasix +2.5 mg daily of metolazone, however patient did not remember taking the metolazone recently. Based on her med list, it appears she had been on 80 mg twice daily of Lasix with no metolazone. - Diuresed well. However, due to bump in Scr (likely due to over diuresis)>> now improved - Net I & O negative 11.7 since admit. -792cc yesterday - Diuretic dosage per MD - Continue Toprol XL 50mg  daily, Hydralazine to 10mg  TID and  Imdur 15mg  daily for LV dysfunction - No ACE/ARB or ARNI due to AKI  2. Persistent atrial fibrillation:   -Vpaced with PVCs on tele but cannot tell if she is in NSR -repeat 12 lead EKG>>ordered but no done -continue BB -On Coumadin  3. Mechanical aortic and mitral valve replacement:  -On Coumadin - INR 1.7 today>>her goal is 2-2.5 per Dr. Tanna Furry notes in 2018 due to increased bruising on higher levels  4. AKI: -  SCr improved to 1.63 today  - as above   For questions or updates, please contact [place] Please consult [email or web address] for contact info under        SignedLeanor Kail, PA  01/26/2021, 11:45 AM

## 2021-01-27 ENCOUNTER — Inpatient Hospital Stay (HOSPITAL_COMMUNITY): Payer: Medicare Other

## 2021-01-27 DIAGNOSIS — J9602 Acute respiratory failure with hypercapnia: Secondary | ICD-10-CM | POA: Diagnosis not present

## 2021-01-27 DIAGNOSIS — J9601 Acute respiratory failure with hypoxia: Secondary | ICD-10-CM | POA: Diagnosis not present

## 2021-01-27 LAB — GLUCOSE, CAPILLARY
Glucose-Capillary: 94 mg/dL (ref 70–99)
Glucose-Capillary: 184 mg/dL — ABNORMAL HIGH (ref 70–99)
Glucose-Capillary: 132 mg/dL — ABNORMAL HIGH (ref 70–99)

## 2021-01-27 LAB — CBC
HCT: 26.8 % — ABNORMAL LOW (ref 36.0–46.0)
Hemoglobin: 8.3 g/dL — ABNORMAL LOW (ref 12.0–15.0)
MCH: 27.8 pg (ref 26.0–34.0)
MCHC: 31 g/dL (ref 30.0–36.0)
MCV: 89.6 fL (ref 80.0–100.0)
Platelets: 162 10*3/uL (ref 150–400)
RBC: 2.99 MIL/uL — ABNORMAL LOW (ref 3.87–5.11)
RDW: 20.7 % — ABNORMAL HIGH (ref 11.5–15.5)
WBC: 7.8 10*3/uL (ref 4.0–10.5)
nRBC: 0 % (ref 0.0–0.2)

## 2021-01-27 LAB — BASIC METABOLIC PANEL
Anion gap: 12 (ref 5–15)
BUN: 85 mg/dL — ABNORMAL HIGH (ref 8–23)
CO2: 30 mmol/L (ref 22–32)
Calcium: 8.7 mg/dL — ABNORMAL LOW (ref 8.9–10.3)
Chloride: 90 mmol/L — ABNORMAL LOW (ref 98–111)
Creatinine, Ser: 1.72 mg/dL — ABNORMAL HIGH (ref 0.44–1.00)
GFR, Estimated: 29 mL/min — ABNORMAL LOW (ref >60)
Glucose, Bld: 93 mg/dL (ref 70–99)
Potassium: 5.1 mmol/L (ref 3.5–5.1)
Sodium: 132 mmol/L — ABNORMAL LOW (ref 135–145)

## 2021-01-27 LAB — MAGNESIUM: Magnesium: 2 mg/dL (ref 1.7–2.4)

## 2021-01-27 LAB — PROTIME-INR
INR: 1.6 — ABNORMAL HIGH (ref 0.8–1.2)
INR: 1.6 — ABNORMAL HIGH (ref 0.8–1.2)
Prothrombin Time: 18.7 seconds — ABNORMAL HIGH (ref 11.4–15.2)
Prothrombin Time: 19.3 seconds — ABNORMAL HIGH (ref 11.4–15.2)

## 2021-01-27 LAB — PHOSPHORUS: Phosphorus: 4.1 mg/dL (ref 2.5–4.6)

## 2021-01-27 MED ORDER — WARFARIN SODIUM 5 MG PO TABS: 10.0000 mg | ORAL_TABLET | Freq: Once | ORAL | Status: DC

## 2021-01-27 MED ORDER — WARFARIN SODIUM 5 MG PO TABS
10.0000 mg | ORAL_TABLET | Freq: Once | ORAL | Status: AC
  Administered 2021-01-27: 10 mg via ORAL
  Filled 2021-01-27: qty 2

## 2021-01-27 MED ORDER — HEPARIN (PORCINE) 25000 UT/250ML-% IV SOLN
1150.0000 [IU]/h | INTRAVENOUS | Status: DC
  Administered 2021-01-27: 1150 [IU]/h via INTRAVENOUS
  Filled 2021-01-27 (×2): qty 250

## 2021-01-27 MED ORDER — FUROSEMIDE 40 MG PO TABS
40.0000 mg | ORAL_TABLET | Freq: Every day | ORAL | Status: DC
  Administered 2021-01-27 – 2021-01-28 (×2): 40 mg via ORAL
  Filled 2021-01-27 (×2): qty 1

## 2021-01-27 MED ORDER — HEPARIN (PORCINE) 25000 UT/250ML-% IV SOLN
1150.0000 [IU]/h | INTRAVENOUS | Status: DC
  Filled 2021-01-27: qty 250

## 2021-01-27 NOTE — Progress Notes (Signed)
PROGRESS NOTE    [name] [name]  NLZ:[id number] DOB: 05-09-1932 DOA: 01/18/2021 PCP: Fayrene Helper, MD   Brief Narrative: This 87 years old female with PMH significant for COPD, chronic combined systolic and diastolic heart failure, complete heart block s/p Medtronic pacemaker, AICD, A. fib, history of mechanical aortic valve on Coumadin, chronic kidney disease stage IIIb, hypothyroidism, seizure disorder presented in the ED with progressive shortness of breath.  Patient has received a dose of Retacrit prior to coming to the hospital. Patient was found to be lethargic on arrival in the ED with ABG showing pH of 7.2 , PCO2 of 80.  chest x-ray shows pulmonary congestion.  Patient was started on BiPAP, nebulization and IV Lasix.  Patient improved on BiPAP and for treatment for heart failure and COPD exacerbation. Patient is admitted for acute hypoxic and hypercapnic respiratory failure secondary to COPD and CHF exacerbation. CHF and COPD has improved.  Patient INR has been subtherapeutic.  Patient started on heparin for bridging with Coumadin until INR becomes therapeutic.  Assessment & Plan:   Principal Problem:   Acute respiratory failure with hypoxia and hypercapnia (HCC) Active Problems:   Hx of aortic valve replacement, mechanical   S/P MVR (mitral valve replacement)   Chronic anticoagulation, on coumadin for Mechanical AVR and MVR   S/P ICD (internal cardiac defibrillator) procedure   Persistent atrial fibrillation (HCC)   COPD with acute exacerbation (HCC)   Type 2 diabetes mellitus with hypoglycemia without coma (HCC)   Acute systolic CHF (congestive heart failure) (HCC)   Stage 3b chronic kidney disease (HCC)   Seizure (HCC)   Acute respiratory failure with hypoxia (HCC)   AKI (acute kidney injury) ([place])  Acute hypoxic and hypercapnic respiratory failure in the setting of COPD and CHFexacerbation. Patient presented with hypoxic and hypercapnic respiratory failure. She was  lethargic on arrival with a pH of 7.2,  PCO2 of 80. Patient was initially placed on BiPAP and she improved. She is off BiPAP now. Continue Bipap as needed. Continued with IV Lasix, Solu-Medrol and nebulization. She is 1.5L negative balance in the last 24 hours. So far she is 12.2 L negative balance since admission. Lasix was held 5/3. Serum creatinine improved to 1.63 Solu-Medrol changed to prednisone taper. Continue supplemental oxygen to keep saturation above 94%. Continue incentive spirometry. She seems back to her baseline breathing. Resume Lasix 40 mg po daily alternating with lasix 40 mg  bid   Acute on chronic combined systolic and diastolic heart failure: Continued with IV Lasix twice daily, monitor intake and output. Urine output 1.2 L in last 24 hours. Echo shows LVEF 25 to 30%. Cardiology input is appreciated.  Lasix was held 5/3,  serum creatinine has improved. Cardiology recommended resuming Lasix 40 p.o. daily alternate with 40 twice daily.  COPD exacerbation: Continue with nebulizer, Pulmicort. Continue supplemental oxygen.  AKI on CKD stage IIIb: Serum creatinine improving and almost backing to the baseline. 2.27> 2.93>1.71>1.62>1.46> but bumped this morning to 1.92> seems over diuresed. We will hold diuretics and monitor serum creatinine.  Serum creatinine improved to 9.02>4.09  Acute metabolic encephalopathy: > Resolved Patient was lethargic on arrival secondary to PCO2 retention which has improved with BiPAP. Patient is alert and oriented, back to her baseline mental status, she is fully involved in conversation  History of mechanical aortic valve and mitral valve replacement on Coumadin INR subtherapeutic 1.6 ,  continue with Coumadin. Discussed with pharmacy, states Coumadin was held 1 day on 5/2, made the INR  to down Will start heparin for bridging with coumadin until INR be therapeutic.  Hypothyroidism: Continue Synthroid.  History of seizure  disorder: Continue Keppra  Hypokalemia: Resolved  Anemia of chronic disease:   monitor H&H. Hb stable 8.5 > 8.9  Left arm thrombophlebitis. Patient has developed significant left arm swelling at the IV site. IV infiltrated which was removed.  Significant bruising on the medial side of the arm. Venous duplex negative for DVT. Advised elevation and icing. Patient is scared to start heparin , will recheck INR at 15:00, if INR not therapeutic, will start heparin.  . Pressure Injury 07/08/20 Buttocks Right Stage 2 -  Partial thickness loss of dermis presenting as a shallow open injury with a red, pink wound bed without slough. 2 cm x 2cm (Active)  07/08/20 0731  Location: Buttocks  Location Orientation: Right  Staging: Stage 2 -  Partial thickness loss of dermis presenting as a shallow open injury with a red, pink wound bed without slough.  Wound Description (Comments): 2 cm x 2cm  Present on Admission: No     DVT prophylaxis:Coumadin Code Status:  Full code. Family Communication: No family at bed side. Disposition Plan:   Status is: Inpatient  Remains inpatient appropriate because:Inpatient level of care appropriate due to severity of illness   Dispo: The patient is from: Home              Anticipated d/c is to: SNF on 5/6              Patient currently is not medically stable to d/c.   Difficult to place patient No  Consultants:    Cardiology  Procedures:  Antimicrobials:  Anti-infectives (From admission, onward)   Start     Dose/Rate Route Frequency Ordered Stop   01/20/21 1600  azithromycin (ZITHROMAX) tablet 500 mg        500 mg Oral Daily 01/20/21 0850 01/22/21 0933   01/18/21 2200  azithromycin (ZITHROMAX) 500 mg in sodium chloride 0.9 % 250 mL IVPB  Status:  Discontinued        500 mg 250 mL/hr over 60 Minutes Intravenous Every 24 hours 01/18/21 2136 01/20/21 0850      Subjective: Patient was seen and examined at bedside.  Overnight events noted.   Patient reports feeling much improved. She was sitting comfortably on the recliner. Her left arm bruising is improving, she is scared to start heparin, states her left arm is bruised.  Objective: Vitals:   01/26/21 2035 01/27/21 0427 01/27/21 0500 01/27/21 0843  BP: (!) 142/59 (!) 105/45    Pulse: (!) 58 (!) 59    Resp: 18 18    Temp: 98.2 F (36.8 C) 98 F (36.7 C)    TempSrc:      SpO2: 99% 100%  97%  Weight:   90.3 kg   Height:   5\' 7"  (1.702 m)     Intake/Output Summary (Last 24 hours) at 01/27/2021 1312 Last data filed at 01/27/2021 1157 Gross per 24 hour  Intake 708 ml  Output 850 ml  Net -142 ml   Filed Weights   01/25/21 0514 01/26/21 0500 01/27/21 0500  Weight: 86.2 kg 88.3 kg 90.3 kg    Examination:  General exam: Appears calm and comfortable, not in any acute distress.  Respiratory system: Clear to auscultation. Respiratory effort normal. Cardiovascular system: S1 & S2 heard, RRR. No JVD, murmurs, rubs, gallops or clicks. No pedal edema. Gastrointestinal system: Abdomen is nondistended, soft and nontender.  No organomegaly or masses felt.  Normal bowel sounds heard. Central nervous system: Alert and oriented. No focal neurological deficits. Extremities: Left arm swelling, bruising and soreness is improving. No edema, no cyanosis, no clubbing.  Skin: No rashes, lesions or ulcers Psychiatry: Judgement and insight appear normal. Mood & affect appropriate.     Data Reviewed: I have personally reviewed following labs and imaging studies  CBC: Recent Labs  Lab 01/21/21 0513 01/22/21 0439 01/23/21 0615 01/25/21 0516 01/26/21 0504 01/27/21 0658  WBC 7.5 7.2 7.5  --  8.7 7.8  HGB 8.5* 8.5* 8.9* 8.7* 8.5* 8.3*  HCT 27.9* 27.7* 29.6* 28.2* 27.6* 26.8*  MCV 89.7 89.4 89.7  --  89.9 89.6  PLT 157 168 180  --  173 440   Basic Metabolic Panel: Recent Labs  Lab 01/23/21 0615 01/24/21 0509 01/25/21 0516 01/26/21 0504 01/27/21 0509  NA 133* 138 133* 134* 132*   K 4.4 4.9 4.4 4.6 5.1  CL 90* 92* 88* 91* 90*  CO2 35* 37* 36* 34* 30  GLUCOSE 104* 103* 94 94 93  BUN 91* 88* 93* 87* 85*  CREATININE 1.62* 1.46* 1.91* 1.63* 1.72*  CALCIUM 8.9 9.3 8.7* 8.7* 8.7*  MG 1.8 1.9  --   --  2.0  PHOS 3.2 3.5  --   --  4.1   GFR: Estimated Creatinine Clearance: 27.1 mL/min (A) (by C-G formula based on SCr of 1.72 mg/dL (H)). Liver Function Tests: Recent Labs  Lab 01/23/21 0615  AST 34  ALT 29  ALKPHOS 39  BILITOT 0.4  PROT 6.5  ALBUMIN 3.3*   No results for input(s): LIPASE, AMYLASE in the last 168 hours. No results for input(s): AMMONIA in the last 168 hours. Coagulation Profile: Recent Labs  Lab 01/23/21 0615 01/24/21 0509 01/25/21 0516 01/26/21 0504 01/27/21 0509  INR 3.1* 3.0* 2.4* 1.7* 1.6*   Cardiac Enzymes: No results for input(s): CKTOTAL, CKMB, CKMBINDEX, TROPONINI in the last 168 hours. BNP (last 3 results) No results for input(s): PROBNP in the last 8760 hours. HbA1C: No results for input(s): HGBA1C in the last 72 hours. CBG: Recent Labs  Lab 01/26/21 1223 01/26/21 1846 01/26/21 2345 01/27/21 0534 01/27/21 1130  GLUCAP 123* 85 119* 94 184*   Lipid Profile: No results for input(s): CHOL, HDL, LDLCALC, TRIG, CHOLHDL, LDLDIRECT in the last 72 hours. Thyroid Function Tests: No results for input(s): TSH, T4TOTAL, FREET4, T3FREE, THYROIDAB in the last 72 hours. Anemia Panel: No results for input(s): VITAMINB12, FOLATE, FERRITIN, TIBC, IRON, RETICCTPCT in the last 72 hours. Sepsis Labs: No results for input(s): PROCALCITON, LATICACIDVEN in the last 168 hours.  Recent Results (from the past 240 hour(s))  Resp Panel by RT-PCR (Flu A&B, Covid) Nasopharyngeal Swab     Status: None   Collection Time: 01/18/21  5:08 PM   Specimen: Nasopharyngeal Swab; Nasopharyngeal(NP) swabs in vial transport medium  Result Value Ref Range Status   SARS Coronavirus 2 by RT PCR NEGATIVE NEGATIVE Final    Comment: (NOTE) SARS-CoV-2 target  nucleic acids are NOT DETECTED.  The SARS-CoV-2 RNA is generally detectable in upper respiratory specimens during the acute phase of infection. The lowest concentration of SARS-CoV-2 viral copies this assay can detect is 138 copies/mL. A negative result does not preclude SARS-Cov-2 infection and should not be used as the sole basis for treatment or other patient management decisions. A negative result may occur with  improper specimen collection/handling, submission of specimen other than nasopharyngeal swab, presence of viral mutation(s)  within the areas targeted by this assay, and inadequate number of viral copies(<138 copies/mL). A negative result must be combined with clinical observations, patient history, and epidemiological information. The expected result is Negative.  Fact Sheet for Patients:  EntrepreneurPulse.com.au  Fact Sheet for Healthcare Providers:  IncredibleEmployment.be  This test is no t yet approved or cleared by the [place] FDA and  has been authorized for detection and/or diagnosis of SARS-CoV-2 by FDA under an Emergency Use Authorization (EUA). This EUA will remain  in effect (meaning this test can be used) for the duration of the COVID-19 declaration under Section 564(b)(1) of the Act, 21 U.S.C.section 360bbb-3(b)(1), unless the authorization is terminated  or revoked sooner.       Influenza A by PCR NEGATIVE NEGATIVE Final   Influenza B by PCR NEGATIVE NEGATIVE Final    Comment: (NOTE) The Xpert Xpress SARS-CoV-2/FLU/RSV plus assay is intended as an aid in the diagnosis of influenza from Nasopharyngeal swab specimens and should not be used as a sole basis for treatment. Nasal washings and aspirates are unacceptable for Xpert Xpress SARS-CoV-2/FLU/RSV testing.  Fact Sheet for Patients: EntrepreneurPulse.com.au  Fact Sheet for Healthcare  Providers: IncredibleEmployment.be  This test is not yet approved or cleared by the [place] FDA and has been authorized for detection and/or diagnosis of SARS-CoV-2 by FDA under an Emergency Use Authorization (EUA). This EUA will remain in effect (meaning this test can be used) for the duration of the COVID-19 declaration under Section 564(b)(1) of the Act, 21 U.S.C. section 360bbb-3(b)(1), unless the authorization is terminated or revoked.  Performed at [facility], [place] [address] [place], [place] [address]   MRSA PCR Screening     Status: None   Collection Time: 01/18/21  9:58 PM   Specimen: Nasal Mucosa; Nasopharyngeal  Result Value Ref Range Status   MRSA by PCR NEGATIVE NEGATIVE Final    Comment:        The GeneXpert MRSA Assay (FDA approved for NASAL specimens only), is one component of a comprehensive MRSA colonization surveillance program. It is not intended to diagnose MRSA infection nor to guide or monitor treatment for MRSA infections. Performed at [facility], [place] [address] [place], [place] [address]     Radiology Studies: No results found.  Scheduled Meds: . budesonide (PULMICORT) nebulizer solution  0.5 mg Nebulization BID  . chlorhexidine  15 mL Mouth Rinse BID  . gabapentin  300 mg Oral BID  . hydrALAZINE  10 mg Oral Q8H  . ipratropium-albuterol  3 mL Nebulization BID  . isosorbide mononitrate  15 mg Oral Daily  . levETIRAcetam  500 mg Oral BID  . levothyroxine  88 mcg Oral QAC breakfast  . mouth rinse  15 mL Mouth Rinse q12n4p  . metoprolol succinate  50 mg Oral Daily  . Warfarin - Pharmacist Dosing Inpatient   Does not apply q1600   Continuous Infusions: . heparin       LOS: 9 days    Time spent: 25 mins    Zimal Weisensel, MD [facility]   If 7PM-7AM, please contact night-coverage

## 2021-01-27 NOTE — Progress Notes (Addendum)
ANTICOAGULATION CONSULT NOTE - Follow Up Consult  Pharmacy Consult for warfarin Indication: atrial fibrillation, mechanical aortic and mitral valve replacement  Allergies  Allergen Reactions  . Penicillins Other (See Comments)    Bruise    . Aspirin Other (See Comments)    On coumadin  . Fentanyl Dermatitis    Rash with patch   . Niacin Itching    Patient Measurements: Height: 5\' 7"  (170.2 cm) Weight: 90.3 kg (199 lb 1.2 oz) IBW/kg (Calculated) : 61.6 Heparin Dosing Weight:   Vital Signs: Temp: 98 F (36.7 C) (05/05 0427) BP: 105/45 (05/05 0427) Pulse Rate: 59 (05/05 0427)  Labs: Recent Labs    01/25/21 0516 01/26/21 0504 01/27/21 0509  HGB 8.7* 8.5*  --   HCT 28.2* 27.6*  --   PLT  --  173  --   LABPROT 26.2* 20.3* 18.7*  INR 2.4* 1.7* 1.6*  CREATININE 1.91* 1.63* 1.72*    Estimated Creatinine Clearance: 27.1 mL/min (A) (by C-G formula based on SCr of 1.72 mg/dL (H)).   Medications:  - PTA warfarin regimen: 10mg  daily except 5 mg on Mon and Fri per [facility] clinic's note, but pt reported taking 10mg  daily (last dose taken on 4/25)  Assessment: Patient's an 87 y.o with hx HF, afib and mechanical aortic and mitral valve replacement on warfarin PTA presented to the ED on 4/26 with c/o of SOB.  Warfarin resumed on 4/27.  Today, 01/27/2021: - INR is subtherapeutic at 1.6 (dose held on 5/2 d/t elevated INR) - cbc stable - no bleeding documented - cardiac diet - no significant drug-drug intxns noted   Goal of Therapy:  HL 0.3-0.74 INR 2-2.5 per outpatient notes based on Dr. Tanna Furry orders in 04/2017 due to significant bruising (per discussed with Dr. Radford Pax on 5/2-per MD, use INR goal as designated by outpatient provider) Monitor platelets by anticoagulation protocol: Yes   Plan:  - warfarin 10mg  PO x1 today - daily INR - monitor for s/sx bleeding  Napoleon Form 01/27/2021,7:07 AM  Addendum   After discussion with [facility], will bridge with heparin today  with INR sub-therapeutic and dropping in hopes that it will be in range tomorrow. Will initiate heparin infusion without bolus at 1150 units/hr and check a HL in 8 hours. Will need to be somewhat conservative with goal d/t significant bruising/hematoma at IV site.   Ulice Dash D  11:03 AM

## 2021-01-27 NOTE — Progress Notes (Signed)
Follow Up Consult Note regarding heparin infusion:  01/27/2021 1517 INR = 1.6 with last dose of warfarin 10mg  administered today at 1124  I called Dr Dwyane Dee who ordered to go ahead with heparin infusion at 1150 units/hr with no bolus as previously ordered.  Plan: No heparin bolus Heparin 1150 units/hour until INR therapeutic 8 hour heparin level Monitor daily INR, heparin level, CBC, s/s bleeding  Madhav Mohon P. Legrand Como, PharmD, [place] Please utilize Amion for appropriate phone number to reach the unit pharmacist ([place]) 01/27/2021 4:57 PM

## 2021-01-27 NOTE — Care Management Important Message (Signed)
Important Message  Patient Details IM Letter given to the Patient. Name: [name] [name] MRN: [id number] Date of Birth: 1931-11-25   Medicare Important Message Given:  Yes     Kerin Salen 01/27/2021, 12:05 PM

## 2021-01-27 NOTE — TOC Progression Note (Signed)
Transition of Care [facility]) - Progression Note    Patient Details  Name: [name] MRN: [id number] Date of Birth: 04/15/1932  Transition of Care [facility]) CM/SW [place], Guadalupe Guerra Phone Number: 01/27/2021, 10:04 AM  Clinical Narrative:   Spoke with Marianna Fuss at [facility] who states that if patient does not transfer tomorrow, weekend admission is possible.  She reminded me that patient cannot come with CPAP/BiPAP. TOC will continue to follow during the course of hospitalization.     Expected Discharge Plan: Skilled Nursing Facility Barriers to Discharge: Barriers Resolved  Expected Discharge Plan and Services Expected Discharge Plan: [place]   Discharge Planning Services: CM Consult Post Acute Care Choice: Home Health Living arrangements for the past 2 months: Single Family Home                                       Social Determinants of Health (SDOH) Interventions    Readmission Risk Interventions Readmission Risk Prevention Plan 01/26/2021 07/08/2020 04/01/2020  Transportation Screening Complete Complete Complete  PCP or Specialist Appt within 3-5 Days - - -  HRI or [place] Work Consult for [place] - - -  Medication Review Press photographer) Complete Complete Complete  PCP or Specialist appointment within 3-5 days of discharge Complete Complete -  [place] or Home Care Consult Complete Complete Complete  SW Recovery Care/Counseling Consult Complete Complete Complete  Palliative Care Screening Not Applicable Not Applicable Not Applicable  Skilled Nursing Facility Complete Complete Not Applicable  Some recent data might be hidden

## 2021-01-28 ENCOUNTER — Inpatient Hospital Stay
Admission: RE | Admit: 2021-01-28 | Discharge: 2021-03-11 | Disposition: A | Discharge status: Home / Self Care | Payer: Medicare Other | Source: Ambulatory Visit | Attending: Internal Medicine | Admitting: Internal Medicine

## 2021-01-28 DIAGNOSIS — I4819 Other persistent atrial fibrillation: Secondary | ICD-10-CM | POA: Diagnosis not present

## 2021-01-28 DIAGNOSIS — Z950 Presence of cardiac pacemaker: Secondary | ICD-10-CM | POA: Diagnosis not present

## 2021-01-28 DIAGNOSIS — Z743 Need for continuous supervision: Secondary | ICD-10-CM | POA: Diagnosis not present

## 2021-01-28 DIAGNOSIS — R41841 Cognitive communication deficit: Secondary | ICD-10-CM | POA: Diagnosis not present

## 2021-01-28 DIAGNOSIS — M1A30X0 Chronic gout due to renal impairment, unspecified site, without tophus (tophi): Secondary | ICD-10-CM | POA: Diagnosis not present

## 2021-01-28 DIAGNOSIS — I700 Atherosclerosis of aorta: Secondary | ICD-10-CM | POA: Diagnosis not present

## 2021-01-28 DIAGNOSIS — G8929 Other chronic pain: Secondary | ICD-10-CM | POA: Diagnosis not present

## 2021-01-28 DIAGNOSIS — E440 Moderate protein-calorie malnutrition: Secondary | ICD-10-CM | POA: Diagnosis not present

## 2021-01-28 DIAGNOSIS — I10 Essential (primary) hypertension: Secondary | ICD-10-CM | POA: Diagnosis not present

## 2021-01-28 DIAGNOSIS — I130 Hypertensive heart and chronic kidney disease with heart failure and stage 1 through stage 4 chronic kidney disease, or unspecified chronic kidney disease: Secondary | ICD-10-CM | POA: Diagnosis not present

## 2021-01-28 DIAGNOSIS — I5021 Acute systolic (congestive) heart failure: Secondary | ICD-10-CM | POA: Diagnosis not present

## 2021-01-28 DIAGNOSIS — I5043 Acute on chronic combined systolic (congestive) and diastolic (congestive) heart failure: Secondary | ICD-10-CM | POA: Diagnosis not present

## 2021-01-28 DIAGNOSIS — E785 Hyperlipidemia, unspecified: Secondary | ICD-10-CM | POA: Diagnosis not present

## 2021-01-28 DIAGNOSIS — Z952 Presence of prosthetic heart valve: Secondary | ICD-10-CM | POA: Diagnosis not present

## 2021-01-28 DIAGNOSIS — R279 Unspecified lack of coordination: Secondary | ICD-10-CM | POA: Diagnosis not present

## 2021-01-28 DIAGNOSIS — E039 Hypothyroidism, unspecified: Secondary | ICD-10-CM | POA: Diagnosis not present

## 2021-01-28 DIAGNOSIS — Z515 Encounter for palliative care: Secondary | ICD-10-CM | POA: Diagnosis not present

## 2021-01-28 DIAGNOSIS — J9602 Acute respiratory failure with hypercapnia: Secondary | ICD-10-CM | POA: Diagnosis not present

## 2021-01-28 DIAGNOSIS — N1832 Chronic kidney disease, stage 3b: Secondary | ICD-10-CM | POA: Diagnosis not present

## 2021-01-28 DIAGNOSIS — N179 Acute kidney failure, unspecified: Secondary | ICD-10-CM | POA: Diagnosis not present

## 2021-01-28 DIAGNOSIS — M6281 Muscle weakness (generalized): Secondary | ICD-10-CM | POA: Diagnosis not present

## 2021-01-28 DIAGNOSIS — J449 Chronic obstructive pulmonary disease, unspecified: Secondary | ICD-10-CM | POA: Diagnosis not present

## 2021-01-28 DIAGNOSIS — Z7901 Long term (current) use of anticoagulants: Secondary | ICD-10-CM | POA: Diagnosis not present

## 2021-01-28 DIAGNOSIS — E119 Type 2 diabetes mellitus without complications: Secondary | ICD-10-CM | POA: Diagnosis not present

## 2021-01-28 DIAGNOSIS — K219 Gastro-esophageal reflux disease without esophagitis: Secondary | ICD-10-CM | POA: Diagnosis not present

## 2021-01-28 DIAGNOSIS — J9611 Chronic respiratory failure with hypoxia: Secondary | ICD-10-CM | POA: Diagnosis not present

## 2021-01-28 DIAGNOSIS — J9601 Acute respiratory failure with hypoxia: Secondary | ICD-10-CM | POA: Diagnosis not present

## 2021-01-28 DIAGNOSIS — R5381 Other malaise: Secondary | ICD-10-CM | POA: Diagnosis not present

## 2021-01-28 DIAGNOSIS — J42 Unspecified chronic bronchitis: Secondary | ICD-10-CM | POA: Diagnosis not present

## 2021-01-28 DIAGNOSIS — R262 Difficulty in walking, not elsewhere classified: Secondary | ICD-10-CM | POA: Diagnosis not present

## 2021-01-28 DIAGNOSIS — I4811 Longstanding persistent atrial fibrillation: Secondary | ICD-10-CM | POA: Diagnosis not present

## 2021-01-28 DIAGNOSIS — Z23 Encounter for immunization: Secondary | ICD-10-CM | POA: Diagnosis not present

## 2021-01-28 DIAGNOSIS — Z9981 Dependence on supplemental oxygen: Secondary | ICD-10-CM | POA: Diagnosis not present

## 2021-01-28 DIAGNOSIS — N184 Chronic kidney disease, stage 4 (severe): Secondary | ICD-10-CM | POA: Diagnosis not present

## 2021-01-28 DIAGNOSIS — I714 Abdominal aortic aneurysm, without rupture: Secondary | ICD-10-CM | POA: Diagnosis not present

## 2021-01-28 DIAGNOSIS — I5022 Chronic systolic (congestive) heart failure: Secondary | ICD-10-CM | POA: Diagnosis not present

## 2021-01-28 DIAGNOSIS — I442 Atrioventricular block, complete: Secondary | ICD-10-CM | POA: Diagnosis not present

## 2021-01-28 DIAGNOSIS — R569 Unspecified convulsions: Secondary | ICD-10-CM | POA: Diagnosis not present

## 2021-01-28 DIAGNOSIS — R2681 Unsteadiness on feet: Secondary | ICD-10-CM | POA: Diagnosis not present

## 2021-01-28 DIAGNOSIS — R600 Localized edema: Secondary | ICD-10-CM | POA: Diagnosis not present

## 2021-01-28 DIAGNOSIS — Z95810 Presence of automatic (implantable) cardiac defibrillator: Secondary | ICD-10-CM | POA: Diagnosis not present

## 2021-01-28 LAB — CBC
HCT: 25.8 % — ABNORMAL LOW (ref 36.0–46.0)
Hemoglobin: 8 g/dL — ABNORMAL LOW (ref 12.0–15.0)
MCH: 28.1 pg (ref 26.0–34.0)
MCHC: 31 g/dL (ref 30.0–36.0)
MCV: 90.5 fL (ref 80.0–100.0)
Platelets: 169 10*3/uL (ref 150–400)
RBC: 2.85 MIL/uL — ABNORMAL LOW (ref 3.87–5.11)
RDW: 20.9 % — ABNORMAL HIGH (ref 11.5–15.5)
WBC: 7.5 10*3/uL (ref 4.0–10.5)
nRBC: 0 % (ref 0.0–0.2)

## 2021-01-28 LAB — GLUCOSE, CAPILLARY
Glucose-Capillary: 111 mg/dL — ABNORMAL HIGH (ref 70–99)
Glucose-Capillary: 109 mg/dL — ABNORMAL HIGH (ref 70–99)
Glucose-Capillary: 111 mg/dL — ABNORMAL HIGH (ref 70–99)

## 2021-01-28 LAB — BASIC METABOLIC PANEL
Anion gap: 11 (ref 5–15)
BUN: 89 mg/dL — ABNORMAL HIGH (ref 8–23)
CO2: 31 mmol/L (ref 22–32)
Calcium: 8.6 mg/dL — ABNORMAL LOW (ref 8.9–10.3)
Chloride: 94 mmol/L — ABNORMAL LOW (ref 98–111)
Creatinine, Ser: 1.72 mg/dL — ABNORMAL HIGH (ref 0.44–1.00)
GFR, Estimated: 29 mL/min — ABNORMAL LOW (ref >60)
Glucose, Bld: 102 mg/dL — ABNORMAL HIGH (ref 70–99)
Potassium: 4.5 mmol/L (ref 3.5–5.1)
Sodium: 136 mmol/L (ref 135–145)

## 2021-01-28 LAB — PROTIME-INR
INR: 1.8 — ABNORMAL HIGH (ref 0.8–1.2)
Prothrombin Time: 20.4 seconds — ABNORMAL HIGH (ref 11.4–15.2)

## 2021-01-28 LAB — HEPARIN LEVEL (UNFRACTIONATED): Heparin Unfractionated: 0.3 IU/mL (ref 0.30–0.70)

## 2021-01-28 LAB — PHOSPHORUS: Phosphorus: 4 mg/dL (ref 2.5–4.6)

## 2021-01-28 LAB — MAGNESIUM: Magnesium: 2 mg/dL (ref 1.7–2.4)

## 2021-01-28 MED ORDER — FUROSEMIDE 40 MG PO TABS: 40.0000 mg | ORAL_TABLET | Freq: Every day | ORAL | 1 refills | Status: DC

## 2021-01-28 MED ORDER — WARFARIN SODIUM 5 MG PO TABS
10.0000 mg | ORAL_TABLET | Freq: Once | ORAL | Status: AC
  Administered 2021-01-28: 10 mg via ORAL
  Filled 2021-01-28: qty 2

## 2021-01-28 MED ORDER — HYDRALAZINE HCL 10 MG PO TABS: 10.0000 mg | ORAL_TABLET | Freq: Three times a day (TID) | ORAL | 0 refills | Status: DC

## 2021-01-28 MED ORDER — METOPROLOL SUCCINATE ER 50 MG PO TB24: 50.0000 mg | ORAL_TABLET | Freq: Every day | ORAL | 1 refills | Status: DC

## 2021-01-28 MED ORDER — ISOSORBIDE MONONITRATE ER 30 MG PO TB24: 15.0000 mg | ORAL_TABLET | Freq: Every day | ORAL | 1 refills | Status: DC

## 2021-01-28 NOTE — Progress Notes (Signed)
No ICM remote transmission received for 01/27/2021 due to current hospitalization and next ICM transmission scheduled for 02/22/2021.

## 2021-01-28 NOTE — Progress Notes (Signed)
Pt has money in her sock, RN advised about locking up, pt declined and placed money back in Pt sock per Pt request.

## 2021-01-28 NOTE — TOC Transition Note (Signed)
Transition of Care [facility]) - CM/SW Discharge Note   Patient Details  Name: [name] [name] MRN: [id number] Date of Birth: 06-05-1932  Transition of Care [facility]) CM/SW Contact:  Trish Mage, LCSW Phone Number: 01/28/2021, 1:31 PM   Clinical Narrative:  Patient who is stable for d/c today will transition to [facility].  PTAR arranged.  Nursing, please call report to [phone]. TOC sign off.      Final next level of care: Skilled Nursing Facility Barriers to Discharge: Barriers Resolved   Patient Goals and CMS Choice Patient states their goals for this hospitalization and ongoing recovery are:: i would like to go back home and be well   Choice offered to / list presented to : Patient  Discharge Placement                       Discharge Plan and Services   Discharge Planning Services: CM Consult Post Acute Care Choice: Home Health                               Social Determinants of Health (SDOH) Interventions     Readmission Risk Interventions Readmission Risk Prevention Plan 01/26/2021 07/08/2020 04/01/2020  Transportation Screening Complete Complete Complete  PCP or Specialist Appt within 3-5 Days - - -  HRI or [place] Work Consult for [place] Planning/Counseling - - -  [place] - - -  Medication Review Press photographer) Complete Complete Complete  PCP or Specialist appointment within 3-5 days of discharge Complete Complete -  HRI or Home Care Consult Complete Complete Complete  SW Recovery Care/Counseling Consult Complete Complete Complete  Palliative Care Screening Not Applicable Not Applicable Not Applicable  Skilled Nursing Facility Complete Complete Not Applicable  Some recent data might be hidden

## 2021-01-28 NOTE — Progress Notes (Signed)
Pt being d/c to [facility] via [place]. RN called report to receiving RN at [facility]. Discharge instructions and medication education provided to pt and placed in packet for receiving facility.

## 2021-01-28 NOTE — Progress Notes (Signed)
ANTICOAGULATION CONSULT NOTE - Follow Up Consult  Pharmacy Consult for warfarin with heparin bridge Indication: atrial fibrillation, mechanical aortic and mitral valve replacement  Allergies  Allergen Reactions  . Penicillins Other (See Comments)    Bruise    . Aspirin Other (See Comments)    On coumadin  . Fentanyl Dermatitis    Rash with patch   . Niacin Itching    Patient Measurements: Height: 5\' 7"  (170.2 cm) Weight: 90.3 kg (199 lb 1.2 oz) IBW/kg (Calculated) : 61.6 Heparin Dosing Weight:   Vital Signs: Temp: 98.7 F (37.1 C) (05/05 1940) Temp Source: Oral (05/05 1940) BP: 131/57 (05/05 2128) Pulse Rate: 60 (05/05 2128)  Labs: Recent Labs    01/26/21 0504 01/27/21 0509 01/27/21 0658 01/27/21 1517 01/28/21 0154  HGB 8.5*  --  8.3*  --  8.0*  HCT 27.6*  --  26.8*  --  25.8*  PLT 173  --  162  --  169  LABPROT 20.3* 18.7*  --  19.3* 20.4*  INR 1.7* 1.6*  --  1.6* 1.8*  HEPARINUNFRC  --   --   --   --  0.30  CREATININE 1.63* 1.72*  --   --  1.72*    Estimated Creatinine Clearance: 27.1 mL/min (A) (by C-G formula based on SCr of 1.72 mg/dL (H)).   Medications:  - PTA warfarin regimen: 10mg  daily except 5 mg on Mon and Fri per [facility] clinic's note, but pt reported taking 10mg  daily (last dose taken on 4/25)  Assessment: Patient's an 87 y.o with hx HF, afib and mechanical aortic and mitral valve replacement on warfarin PTA presented to the ED on 4/26 with c/o of SOB.  Warfarin resumed on 4/27. Heparin added to bridge 5/5  Today, 01/28/2021: - HL 0.3 therapeutic on 1150 units/hr - INR is subtherapeutic at 1.8 (dose held on 5/2 d/t elevated INR) - cbc stable - no bleeding per RN - cardiac diet - no significant drug-drug intxns noted   Goal of Therapy:  HL 0.3-0.74 INR 2-2.5 per outpatient notes based on Dr. Tanna Furry orders in 04/2017 due to significant bruising (per discussed with Dr. Radford Pax on 5/2-per MD, use INR goal as designated by outpatient  provider) Monitor platelets by anticoagulation protocol: Yes   Plan:  - Continue heparin drip at 1150 units/hr - heparin level in 8 hours - warfarin 10mg  PO x1 today - daily INR - monitor for s/sx bleeding  Dolly Rias RPh 01/28/2021, 2:46 AM

## 2021-01-28 NOTE — Discharge Instructions (Signed)
Advised to follow-up with primary care physician in 1 week.   Advised to follow-up with Dr. Seymour Bars in 1 week. Advised to continue Coumadin 10 mg daily. Advised to check INR tomorrow and adjust Coumadin accordingly. Patient's medication had been adjusted.   Hydralazine 10 mg 3 times daily, Imdur 30 mg daily, Toprol 50 mg daily. Advised to take Lasix 40 mg daily alternating with 40 mg twice daily.

## 2021-01-28 NOTE — Discharge Summary (Signed)
Physician Discharge Summary  [name] [name] QTM:[id number] DOB: 1931/11/30 DOA: 01/18/2021  PCP: Fayrene Helper, MD  Admit date: 01/18/2021   Discharge date: 01/28/2021  Admitted From:  Home.  Disposition:   SNF (skilled nursing facility)  Recommendations for Outpatient Follow-up:  1. Follow up with PCP in 1-2 weeks. 2. Please obtain BMP/CBC in one week. 3. Advised to follow-up with Cardiologist  in 1 week. 4. Advised to continue Coumadin 10 mg daily. 5. Advised to check INR tomorrow and adjust Coumadin accordingly. 6. Patient's medication had been adjusted.  Hydralazine 10 mg 3 times daily, Imdur 30 mg daily, Toprol 50 mg daily. 7. Advised to take Lasix 40 mg daily alternating with 40 mg twice daily. 8. Advised to keep left arm elevated and do heating and cold compresses.  Home Health: None. Equipment/Devices: Oxygen @ 2L/M  Discharge Condition: Stable CODE STATUS:Full code Diet recommendation: Heart Healthy   Brief Summary: This 87 years old female with PMH significant for COPD, chronic combined systolic and diastolic heart failure, complete heart block s/p Medtronic pacemaker, AICD, A. fib, history of mechanical aortic valve on Coumadin, chronic kidney disease stage IIIb, hypothyroidism, seizure disorder presented in the ED with progressive shortness of breath.  Patient has received a dose of Retacrit prior to coming to the hospital. Patient was found to be lethargic on arrival in the ED with ABG showing pH of 7.2 , PCO2 of 80.  chest x-ray shows pulmonary congestion. Patient was started on BiPAP, nebulization and IV Lasix.  Patient improved on BiPAP and for treatment for heart failure and COPD exacerbation.  Hospital Course Patient was admitted for acute hypoxic and hypercapnic respiratory failure secondary to COPD and CHF exacerbation.  She was managed for CHF and COPD, Cardiology was consulted.  Patient was continued on IV diuresis.  Patient is 13 pounds negative balance since  admission.  CHF and COPD has improved. Echo shows LVEF 25 to 30%. Cardiology did adjustment in medications.  Patient was continued on Coumadin for mechanical valves.   INR has been therapeutic. One day INR was 3.0, Coumadin was held for 1 day which dropped the INR significantly to 1.6.  Patient was started on heparin for bridging with Coumadin until INR becomes therapeutic.  INR 1.8 on day of discharge.  Patient is being discharged on Coumadin 10 mg daily,  we will request nursing home physician to check INR tomorrow and adjust Coumadin accordingly. Patient has developed left arm swelling secondary to IV infiltration.  Venous duplex negative for DVT or superficial thrombophlebitis.  Patient has developed significant bruising on the left arm and swelling.  Left arm swelling has been improving,  Patient was advised to apply warm and cold compresses.  Renal functions improving.  Patient is at her baseline condition.  PT recommended skilled nursing facility.  Patient is being discharged.  She was managed for below problems.  Discharge Diagnoses:  Principal Problem:   Acute respiratory failure with hypoxia and hypercapnia (HCC) Active Problems:   Hx of aortic valve replacement, mechanical   S/P MVR (mitral valve replacement)   Chronic anticoagulation, on coumadin for Mechanical AVR and MVR   S/P ICD (internal cardiac defibrillator) procedure   Persistent atrial fibrillation (HCC)   COPD with acute exacerbation (HCC)   Type 2 diabetes mellitus with hypoglycemia without coma (HCC)   Acute systolic CHF (congestive heart failure) (HCC)   Stage 3b chronic kidney disease (HCC)   Seizure ([place])   Acute respiratory failure with hypoxia ([place])  AKI (acute kidney injury) ([place])  Acute hypoxic and hypercapnic respiratory failure in the setting of COPD and CHFexacerbation. Patient presented with hypoxic and hypercapnic respiratory failure. She was lethargic on arrival with a pH of 7.2,  PCO2 of 80. Patient was  initially placed on BiPAP and she improved. She is off BiPAP now. Continue Bipap as needed. Continued with IV Lasix, Solu-Medrol and nebulization. She is 1.5L negative balance in the last 24 hours. So far she is 12.2 L negative balance since admission. Lasix was held 5/3. Serum creatinine improved to 1.63 Solu-Medrol changed to prednisone taper. Continue supplemental oxygen to keep saturation above 94%. Continue incentive spirometry. She seems back to her baseline breathing. Resume Lasix 40 mg po daily alternating with lasix 40 mg  bid   Acute on chronic combined systolic and diastolic heart failure: Continued with IV Lasix twice daily, monitor intake and output. Urine output 1.2 L in last 24 hours. Echo shows LVEF 25 to 30%. Cardiology input is appreciated.  Lasix was held 5/3,  serum creatinine has improved. Cardiology recommended resuming Lasix 40 p.o. daily alternate with 40 twice daily.  COPD exacerbation: Continue with nebulizer, Pulmicort. Continue supplemental oxygen.  AKI on CKD stage IIIb: Serum creatinine improving and almost backing to the baseline. 2.27> 2.93>1.71>1.62>1.46> but bumped this morning to 1.92> seems over diuresed. We held diuretics and monitor serum creatinine.  Serum creatinine improved to 0.96>2.83  Acute metabolic encephalopathy: > Resolved Patient was lethargic on arrival secondary to PCO2 retention which has improved with BiPAP. Patient is alert and oriented, back to her baseline mental status, she is fully involved in conversation  History of mechanical aortic valve and mitral valve replacement on Coumadin INR subtherapeutic 1.6 ,  continue with Coumadin. Discussed with pharmacy, states Coumadin was held 1 day on 5/2, made the INR to down started heparin for bridging with coumadin until INR be therapeutic. INR 1.8 today, continue coumadin 10 mg daily  Hypothyroidism: Continue Synthroid.  History of seizure disorder: Continue  Keppra  Hypokalemia: Resolved  Anemia of chronic disease:   monitor H&H. Hb stable 8.5 > 8.9  Left arm thrombophlebitis. Patient has developed significant left arm swelling at the IV site. IV infiltrated which was removed.  Significant bruising on the medial side of the arm. Venous duplex negative for DVT. Advised elevation and icing.   . Pressure Injury 07/08/20 Buttocks Right Stage 2 - Partial thickness loss of dermis presenting as a shallow open injury with a red, pink wound bed without slough. 2 cm x 2cm (Active)  07/08/20 0731  Location: Buttocks  Location Orientation: Right  Staging: Stage 2 - Partial thickness loss of dermis presenting as a shallow open injury with a red, pink wound bed without slough.  Wound Description (Comments): 2 cm x 2cm  Present on Admission: No     Discharge Instructions  Discharge Instructions    Call MD for:  difficulty breathing, headache or visual disturbances   Complete by: As directed    Call MD for:  persistant dizziness or light-headedness   Complete by: As directed    Call MD for:  persistant nausea and vomiting   Complete by: As directed    Diet - low sodium heart healthy   Complete by: As directed    Diet Carb Modified   Complete by: As directed    Discharge instructions   Complete by: As directed    Advised to follow-up with primary care physician in 1 week.   Advised to  follow-up with Dr. Seymour Bars in 1 week. Advised to continue Coumadin 10 mg daily. Advised to check INR tomorrow and adjust Coumadin accordingly. Patient's medication had been adjusted.  Hydralazine 10 mg 3 times daily, Imdur 30 mg daily, Toprol 50 mg daily. Advised to take Lasix 40 mg daily alternating with 40 mg twice daily.   Increase activity slowly   Complete by: As directed      Allergies as of 01/28/2021      Reactions   Penicillins Other (See Comments)   Bruise   Aspirin Other (See Comments)   On coumadin   Fentanyl Dermatitis   Rash with  patch   Niacin Itching      Medication List    STOP taking these medications   HYDROcodone-homatropine 5-1.5 MG/5ML syrup Commonly known as: Hycodan   metoprolol tartrate 50 MG tablet Commonly known as: LOPRESSOR   senna 8.6 MG Tabs tablet Commonly known as: SENOKOT   UNABLE TO FIND     TAKE these medications   acetaminophen 500 MG tablet Commonly known as: TYLENOL Take 1 tablet (500 mg total) by mouth every 6 (six) hours as needed (pain). What changed: reasons to take this   albuterol (2.5 MG/3ML) 0.083% nebulizer solution Commonly known as: PROVENTIL Take 3 mLs (2.5 mg total) by nebulization in the morning and at bedtime. *May use additional one time if needed for shortness of breath What changed: additional instructions   albuterol 108 (90 Base) MCG/ACT inhaler Commonly known as: VENTOLIN HFA Inhale 1 puff into the lungs every 6 (six) hours as needed for wheezing or shortness of breath. What changed: Another medication with the same name was changed. Make sure you understand how and when to take each.   allopurinol 100 MG tablet Commonly known as: ZYLOPRIM TAKE ONE TABLET BY MOUTH ONCE DAILY.   benzonatate 100 MG capsule Commonly known as: TESSALON Take 1 capsule (100 mg total) by mouth 2 (two) times daily as needed for cough.   Breo Ellipta 100-25 MCG/INH Aepb Generic drug: fluticasone furoate-vilanterol INHALE (1) PUFF BY MOUTH INTO THE LUNGS DAILY What changed: See the new instructions.   cholecalciferol 25 MCG (1000 UNIT) tablet Commonly known as: VITAMIN D3 Take 1 tablet (1,000 Units total) by mouth in the morning.   fluticasone 50 MCG/ACT nasal spray Commonly known as: FLONASE Place 2 sprays into both nostrils daily. What changed:   when to take this  reasons to take this   folic acid 1 MG tablet Commonly known as: FOLVITE TAKE 1 TABLET(1 MG) BY MOUTH DAILY What changed:   how much to take  how to take this  when to take  this  additional instructions   furosemide 40 MG tablet Commonly known as: LASIX Take 1 tablet (40 mg total) by mouth daily. Advised to take 40 mg Lasix once a day alternating with 40 mg Lasix twice daily. Start taking on: Jan 29, 2021 What changed:   how much to take  how to take this  when to take this  additional instructions   gabapentin 300 MG capsule Commonly known as: NEURONTIN TAKE (1) CAPSULE BY MOUTH TWICE DAILY What changed: See the new instructions.   hydrALAZINE 10 MG tablet Commonly known as: APRESOLINE Take 1 tablet (10 mg total) by mouth every 8 (eight) hours.   isosorbide mononitrate 30 MG 24 hr tablet Commonly known as: IMDUR Take 0.5 tablets (15 mg total) by mouth daily. Start taking on: Jan 29, 2021   levETIRAcetam 500 MG  tablet Commonly known as: KEPPRA TAKE (1) TABLET BY MOUTH TWICE DAILY AT 9AM AND 9PM What changed: See the new instructions.   metoprolol succinate 50 MG 24 hr tablet Commonly known as: TOPROL-XL Take 1 tablet (50 mg total) by mouth daily. Take with or immediately following a meal. Start taking on: Jan 29, 2021   nitroGLYCERIN 0.4 MG SL tablet Commonly known as: NITROSTAT DISSOLVE 1 TABLET SUBLINGUALLY AS NEEDED FOR CHEST PAIN, MAY REPEAT EVERY 5 MINUTES. AFTER 3 CALL 911. What changed: See the new instructions.   NON FORMULARY Diet Change: Soft diet with dys 2 (ground) meats, thin liquids (NAS, cons CHO, heart healthy)   ondansetron 4 MG disintegrating tablet Commonly known as: ZOFRAN-ODT TAKE (1) TABLET BY MOUTH EVERY EIGHT HOURS AS NEEDED FOR NAUSEA OR VOMITING What changed: See the new instructions.   OXYGEN Inhale 3 L into the lungs continuous.   polyethylene glycol 17 g packet Commonly known as: MIRALAX / GLYCOLAX Take 17 g by mouth daily as needed. What changed: reasons to take this   potassium chloride SA 20 MEQ tablet Commonly known as: KLOR-CON TAKE ONE TABLET BY MOUTH ONCE DAILY.   pravastatin 40 MG  tablet Commonly known as: PRAVACHOL TAKE 1 TABLET BY MOUTH EVERY EVENING What changed: when to take this   SALONPAS PAIN RELIEF PATCH EX Apply 1 patch topically daily as needed (pain).   Synthroid 88 MCG tablet Generic drug: levothyroxine TAKE 1 TABLET BEFORE BREAKFAST. What changed: See the new instructions.   UNABLE TO FIND Compression Stockings R-  Ankle  26.5cm   Calf 38cm  Leg  44cm L-  Ankle  27.5 cm   Calf 39cm  Leg  44cm   warfarin 10 MG tablet Commonly known as: COUMADIN Take as directed. If you are unsure how to take this medication, talk to your nurse or doctor. Original instructions: TAKE (1) TABLET BY MOUTH DAILY AT 4PM What changed: See the new instructions.       Contact information for follow-up providers    Lendon Colonel, NP Follow up on 02/11/2021.   Specialties: Nurse Practitioner, Radiology, Cardiology Why: @2 :15pm for hospital follow with Dr. Victorino December PA. please arrive 15 minutes early  Contact information: [address] [place] [address] [phone]        Croitoru, Dani Gobble, MD Follow up in 1 week(s).   Specialty: Cardiology Contact information: [address] [place] [address] [phone]            Contact information for after-discharge care    [facility] .   Service: Skilled Nursing Contact information: [address] [place] [address] [phone]                 Allergies  Allergen Reactions  . Penicillins Other (See Comments)    Bruise    . Aspirin Other (See Comments)    On coumadin  . Fentanyl Dermatitis    Rash with patch   . Niacin Itching    Consultations:  Cardiology   Procedures/Studies: DG Chest 2 View  Result Date: 12/31/2020 CLINICAL DATA:  Lower extremity edema EXAM: CHEST - 2 VIEW COMPARISON:  December 16, 2020 and September 10, 2020 FINDINGS: There remains diffuse interstitial thickening,  likely representing chronic bronchitis. No appreciable alveolar edema or consolidation. Heart remains enlarged. Pacemaker leads are attached to the right atrium, right ventricle, and coronary sinus. Patient is status post mitral and  aortic valve replacements. There is central pulmonary vascular prominence, likely reflecting underlying pulmonary arterial hypertension. There is aortic atherosclerosis. Bones are osteoporotic. IMPRESSION: Suspect underlying chronic bronchitis. A mild degree of chronic congestive heart failure could present similarly. No airspace consolidation. There is cardiomegaly with apparent pulmonary arterial hypertension. Postoperative changes noted. Aortic Atherosclerosis (ICD10-I70.0). Bones osteoporotic. Electronically Signed   By: Lowella Grip III M.D.   On: 12/31/2020 12:16   DG Elbow 2 Views Left  Result Date: 01/27/2021 CLINICAL DATA:  Soft tissue edema EXAM: LEFT ELBOW - 2 VIEW COMPARISON:  None. FINDINGS: Frontal and lateral views obtained. There is soft tissue swelling. No fracture or dislocation. No joint effusion. The joint spaces appear normal. No erosive change. There are foci of arterial vascular calcification. IMPRESSION: Soft tissue swelling. No fracture or dislocation. No appreciable arthropathy. Atherosclerotic arterial vascular calcifications noted. Electronically Signed   By: Lowella Grip III M.D.   On: 01/27/2021 13:17   US Renal  Result Date: 01/18/2021 CLINICAL DATA:  Renal failure. EXAM: RENAL / URINARY TRACT ULTRASOUND COMPLETE COMPARISON:  Abdominal CT 06/26/2016. FINDINGS: Right Kidney: Renal measurements: 9.9 x 4.1 x 4.5 cm = volume: 95 mL. Mild hydronephrosis. Renal parenchymal thinning with increased parenchymal echogenicity. 1.1 cm cyst in the mid kidney. Left Kidney: Very limited evaluation due to shadowing bowel gas. Renal measurements: 10.4 x 5.1 x 4.9 cm = volume: 136 mL. No gross hydronephrosis. More detailed assessment is limited. Cyst on  prior CT is not seen. Bladder: Appears normal for degree of bladder distention. Neither ureteral jet is seen. Other: None. IMPRESSION: 1. Mild right hydronephrosis. 2. Right renal parenchymal thinning and increased echogenicity consistent with chronic medical renal disease. 3. Significantly limited evaluation of the left kidney, no gross hydronephrosis. Electronically Signed   By: Keith Rake M.D.   On: 01/18/2021 18:26   DG Chest Port 1 View  Result Date: 01/20/2021 CLINICAL DATA:  Pulmonary edema. EXAM: PORTABLE CHEST 1 VIEW COMPARISON:  Chest x-ray 01/18/2021. FINDINGS: AICD in stable position. Prior cardiac valve replacement. Stable prominent cardiomegaly. Mild bilateral interstitial prominence. Mild interstitial edema and/or pneumonitis cannot be excluded. Progressive bibasilar atelectasis. Small left pleural effusion cannot be excluded. No pneumothorax. IMPRESSION: 1. AICD in stable position. Prior cardiac valve replacement. Stable prominent cardiomegaly. Mild bilateral interstitial prominence. Mild interstitial edema and/or pneumonitis cannot be excluded. 2. Progressive bibasilar atelectasis. Small left pleural effusion cannot be excluded. Electronically Signed   By: Marcello Moores  Register   On: 01/20/2021 05:44   DG Chest Port 1 View  Result Date: 01/18/2021 CLINICAL DATA:  87 year old female with shortness of breath. EXAM: PORTABLE CHEST 1 VIEW COMPARISON:  Chest radiograph dated 12/31/2020. FINDINGS: There is cardiomegaly with mild central vascular congestion. No focal consolidation, pleural effusion, or pneumothorax. Left pectoral AICD device. Atherosclerotic calcification of the aorta. Median sternotomy wires. No acute osseous pathology. IMPRESSION: Cardiomegaly with mild central vascular congestion. No focal consolidation. Electronically Signed   By: Anner Crete M.D.   On: 01/18/2021 18:32   ECHOCARDIOGRAM COMPLETE  Result Date: 01/21/2021    ECHOCARDIOGRAM REPORT   Patient Name:    Akeya Lamica Date of Exam: 01/21/2021 Medical Rec #:  [id number]      Height:       67.0 in Accession #:    [id number]     Weight:       201.7 lb Date of Birth:  1931-12-30      BSA:          2.030 m Patient  Age:    7 years       BP:           148/103 mmHg Patient Gender: F              HR:           65 bpm. Exam Location:  Inpatient Procedure: 2D Echo Indications:    Congestive Heart Failure I50.9  History:        Patient has prior history of Echocardiogram examinations, most                 recent 01/31/2020. Defibrillator; Risk Factors:Dyslipidemia and                 Diabetes.                 Aortic Valve: mechanical valve is present in the aortic                 position. Procedure Date: 2001.                 Mitral Valve: mechanical valve valve is present in the mitral                 position. Procedure Date: 2004.  Sonographer:    Mikki Santee RDCS (AE) Referring Phys: [id number] BELKYS A REGALADO IMPRESSIONS  1. Left ventricular ejection fraction, by estimation, is 25 to 30%. The left ventricle has severely decreased function. The left ventricle demonstrates global hypokinesis. The left ventricular internal cavity size was moderately dilated. There is mild concentric left ventricular hypertrophy. Left ventricular diastolic parameters are indeterminate.  2. Right ventricular systolic function is normal. The right ventricular size is normal. There is severely elevated pulmonary artery systolic pressure.  3. Left atrial size was severely dilated.  4. Right atrial size was severely dilated.  5. A small pericardial effusion is present. The pericardial effusion is posterior to the left ventricle.  6. Mitral valve gradient essentially unchanged form 01/2020. The mitral valve has been repaired/replaced. No evidence of mitral valve regurgitation. No evidence of mitral stenosis. The mean mitral valve gradient is 6.0 mmHg. There is a mechanical valve present in the mitral position. Procedure Date: 2004.  7. Tricuspid  valve regurgitation is mild to moderate.  8. The aortic valve has been repaired/replaced. Aortic valve regurgitation is not visualized. No aortic stenosis is present. There is a mechanical valve present in the aortic position. Procedure Date: 2001. Echo findings are consistent with normal structure and function of the aortic valve prosthesis. Aortic valve area, by VTI measures 1.32 cm. Aortic valve mean gradient measures 7.0 mmHg. Aortic valve Vmax measures 2.00 m/s.  9. Aortic dilatation noted. There is mild dilatation of the ascending aorta, measuring 38 mm. 10. The inferior vena cava is dilated in size with <50% respiratory variability, suggesting right atrial pressure of 15 mmHg. FINDINGS  Left Ventricle: Left ventricular ejection fraction, by estimation, is 25 to 30%. The left ventricle has severely decreased function. The left ventricle demonstrates global hypokinesis. The left ventricular internal cavity size was moderately dilated. There is mild concentric left ventricular hypertrophy. Left ventricular diastolic parameters are indeterminate. Right Ventricle: The right ventricular size is normal. No increase in right ventricular wall thickness. Right ventricular systolic function is normal. There is severely elevated pulmonary artery systolic pressure. The tricuspid regurgitant velocity is 3.89 m/s, and with an assumed right atrial pressure of 15 mmHg, the estimated right ventricular systolic pressure is 26.3 mmHg. Left Atrium:  Left atrial size was severely dilated. Right Atrium: Right atrial size was severely dilated. Pericardium: A small pericardial effusion is present. The pericardial effusion is posterior to the left ventricle. Mitral Valve: Mitral valve gradient essentially unchanged form 01/2020. The mitral valve has been repaired/replaced. No evidence of mitral valve regurgitation. There is a mechanical valve present in the mitral position. Procedure Date: 2004. No evidence of mitral valve stenosis. MV  peak gradient, 15.8 mmHg. The mean mitral valve gradient is 6.0 mmHg with average heart rate of 59 bpm. Tricuspid Valve: The tricuspid valve is normal in structure. Tricuspid valve regurgitation is mild to moderate. No evidence of tricuspid stenosis. Aortic Valve: The aortic valve has been repaired/replaced. Aortic valve regurgitation is not visualized. No aortic stenosis is present. Aortic valve mean gradient measures 7.0 mmHg. Aortic valve peak gradient measures 16.0 mmHg. Aortic valve area, by VTI  measures 1.32 cm. There is a mechanical valve present in the aortic position. Procedure Date: 2001. Echo findings are consistent with normal structure and function of the aortic valve prosthesis. Pulmonic Valve: The pulmonic valve was normal in structure. Pulmonic valve regurgitation is mild. No evidence of pulmonic stenosis. Aorta: Aortic dilatation noted. There is mild dilatation of the ascending aorta, measuring 38 mm. Venous: The inferior vena cava is dilated in size with less than 50% respiratory variability, suggesting right atrial pressure of 15 mmHg. IAS/Shunts: No atrial level shunt detected by color flow Doppler. Additional Comments: A venous catheter is visualized.  LEFT VENTRICLE PLAX 2D LVIDd:         6.35 cm  Diastology LVIDs:         5.49 cm  LV e' medial:    5.78 cm/s LV PW:         0.96 cm  LV E/e' medial:  33.2 LV IVS:        1.10 cm  LV e' lateral:   5.61 cm/s LVOT diam:     2.20 cm  LV E/e' lateral: 34.2 LV SV:         55 LV SV Index:   27 LVOT Area:     3.80 cm  RIGHT VENTRICLE TAPSE (M-mode): 1.2 cm LEFT ATRIUM            Index       RIGHT ATRIUM           Index LA diam:      4.70 cm  2.32 cm/m  RA Area:     33.90 cm LA Vol (A2C): 78.8 ml  38.82 ml/m RA Volume:   140.00 ml 68.98 ml/m LA Vol (A4C): 103.0 ml 50.75 ml/m  AORTIC VALVE AV Area (Vmax):    1.85 cm AV Area (Vmean):   1.71 cm AV Area (VTI):     1.32 cm AV Vmax:           200.00 cm/s AV Vmean:          124.000 cm/s AV VTI:             0.419 m AV Peak Grad:      16.0 mmHg AV Mean Grad:      7.0 mmHg LVOT Vmax:         97.50 cm/s LVOT Vmean:        55.900 cm/s LVOT VTI:          0.145 m LVOT/AV VTI ratio: 0.35  AORTA Ao Root diam: 3.10 cm MITRAL VALVE  TRICUSPID VALVE MV Area (PHT): 3.17 cm     TR Peak grad:   60.5 mmHg MV Area VTI:   1.17 cm     TR Vmax:        389.00 cm/s MV Peak grad:  15.8 mmHg MV Mean grad:  6.0 mmHg     SHUNTS MV Vmax:       1.99 m/s     Systemic VTI:  0.14 m MV Vmean:      118.0 cm/s   Systemic Diam: 2.20 cm MV Decel Time: 239 msec MV E velocity: 192.00 cm/s MV A velocity: 66.10 cm/s MV E/A ratio:  2.90 Skeet Latch MD Electronically signed by Skeet Latch MD Signature Date/Time: 01/21/2021/11:43:07 AM    Final    VAS [place] UPPER EXTREMITY VENOUS DUPLEX  Result Date: 01/23/2021 UPPER VENOUS STUDY  Patient Name:  Bonna Kleckley  Date of Exam:   01/23/2021 Medical Rec #: [id number]       Accession #:    [id number] Date of Birth: 12/15/1931       Patient Gender: F Patient Age:   88Y Exam Location:  [facility] Procedure:      VAS [place] UPPER EXTREMITY VENOUS DUPLEX Referring Phys: [id number] Koben Daman --------------------------------------------------------------------------------  Indications: Pain, and Swelling Comparison Study: no prior Performing Technologist: Abram Sander RVS  Examination Guidelines: A complete evaluation includes B-mode imaging, spectral Doppler, color Doppler, and power Doppler as needed of all accessible portions of each vessel. Bilateral testing is considered an integral part of a complete examination. Limited examinations for reoccurring indications may be performed as noted.  Left Findings: +----------+------------+---------+-----------+----------+-------+ LEFT      CompressiblePhasicitySpontaneousPropertiesSummary +----------+------------+---------+-----------+----------+-------+ IJV           Full       Yes       Yes                       +----------+------------+---------+-----------+----------+-------+ Subclavian    Full       Yes       Yes                      +----------+------------+---------+-----------+----------+-------+ Axillary      Full       Yes       Yes                      +----------+------------+---------+-----------+----------+-------+ Brachial      Full       Yes       Yes                      +----------+------------+---------+-----------+----------+-------+ Radial        Full                                          +----------+------------+---------+-----------+----------+-------+ Ulnar         Full                                          +----------+------------+---------+-----------+----------+-------+ Cephalic      Full                                          +----------+------------+---------+-----------+----------+-------+  Basilic       Full                                          +----------+------------+---------+-----------+----------+-------+  Summary:  Left: No evidence of deep vein thrombosis in the upper extremity. No evidence of superficial vein thrombosis in the upper extremity. No evidence of thrombosis in the subclavian. Nonvascularized area of mixed echos measuring 3.57cm x 2.50cm in the proximal forearm. Etiology unknow.  *See table(s) above for measurements and observations.  Diagnosing physician: Harold Barban MD Electronically signed by Harold Barban MD on 01/23/2021 at 4:26:05 PM.    Final      Subjective: Patient was seen and examined at bedside.  Overnight events noted.   Patient reports feeling much improved.  Left arm swelling is improving,  still has significant bruising but is improving.   INR is 1.8.  Patient will be taking Coumadin.   Discharge Exam: Vitals:   01/28/21 0823 01/28/21 1301  BP:  103/62  Pulse:  (!) 58  Resp:  15  Temp:  98.4 F (36.9 C)  SpO2: 96% 100%   Vitals:   01/28/21 0304 01/28/21 0500 01/28/21 0823 01/28/21  1301  BP: 130/60   103/62  Pulse: 60   (!) 58  Resp: (!) 21   15  Temp: 97.6 F (36.4 C)   98.4 F (36.9 C)  TempSrc: Oral     SpO2: 100%  96% 100%  Weight:  91.8 kg    Height:        General: Pt is alert, awake, not in acute distress Cardiovascular: RRR, S1/S2 +, no rubs, no gallops Respiratory: CTA bilaterally, no wheezing, no rhonchi Abdominal: Soft, NT, ND, bowel sounds + Extremities: no edema, no cyanosis    The results of significant diagnostics from this hospitalization (including imaging, microbiology, ancillary and laboratory) are listed below for reference.     Microbiology: Recent Results (from the past 240 hour(s))  Resp Panel by RT-PCR (Flu A&B, Covid) Nasopharyngeal Swab     Status: None   Collection Time: 01/18/21  5:08 PM   Specimen: Nasopharyngeal Swab; Nasopharyngeal(NP) swabs in vial transport medium  Result Value Ref Range Status   SARS Coronavirus 2 by RT PCR NEGATIVE NEGATIVE Final    Comment: (NOTE) SARS-CoV-2 target nucleic acids are NOT DETECTED.  The SARS-CoV-2 RNA is generally detectable in upper respiratory specimens during the acute phase of infection. The lowest concentration of SARS-CoV-2 viral copies this assay can detect is 138 copies/mL. A negative result does not preclude SARS-Cov-2 infection and should not be used as the sole basis for treatment or other patient management decisions. A negative result may occur with  improper specimen collection/handling, submission of specimen other than nasopharyngeal swab, presence of viral mutation(s) within the areas targeted by this assay, and inadequate number of viral copies(<138 copies/mL). A negative result must be combined with clinical observations, patient history, and epidemiological information. The expected result is Negative.  Fact Sheet for Patients:  EntrepreneurPulse.com.au  Fact Sheet for Healthcare Providers:  IncredibleEmployment.be  This  test is no t yet approved or cleared by the [place] FDA and  has been authorized for detection and/or diagnosis of SARS-CoV-2 by FDA under an Emergency Use Authorization (EUA). This EUA will remain  in effect (meaning this test can be used) for the duration of the COVID-19 declaration under Section 564(b)(1) of  the Act, 21 U.S.C.section 360bbb-3(b)(1), unless the authorization is terminated  or revoked sooner.       Influenza A by PCR NEGATIVE NEGATIVE Final   Influenza B by PCR NEGATIVE NEGATIVE Final    Comment: (NOTE) The Xpert Xpress SARS-CoV-2/FLU/RSV plus assay is intended as an aid in the diagnosis of influenza from Nasopharyngeal swab specimens and should not be used as a sole basis for treatment. Nasal washings and aspirates are unacceptable for Xpert Xpress SARS-CoV-2/FLU/RSV testing.  Fact Sheet for Patients: EntrepreneurPulse.com.au  Fact Sheet for Healthcare Providers: IncredibleEmployment.be  This test is not yet approved or cleared by the [place] FDA and has been authorized for detection and/or diagnosis of SARS-CoV-2 by FDA under an Emergency Use Authorization (EUA). This EUA will remain in effect (meaning this test can be used) for the duration of the COVID-19 declaration under Section 564(b)(1) of the Act, 21 U.S.C. section 360bbb-3(b)(1), unless the authorization is terminated or revoked.  Performed at [facility], [place] [address] [place], [place] [address]   MRSA PCR Screening     Status: None   Collection Time: 01/18/21  9:58 PM   Specimen: Nasal Mucosa; Nasopharyngeal  Result Value Ref Range Status   MRSA by PCR NEGATIVE NEGATIVE Final    Comment:        The GeneXpert MRSA Assay (FDA approved for NASAL specimens only), is one component of a comprehensive MRSA colonization surveillance program. It is not intended to diagnose MRSA infection nor to guide or monitor treatment  for MRSA infections. Performed at [facility], [place] [address] [place], [place] [address]      Labs: BNP (last 3 results) Recent Labs    12/31/20 1223 01/18/21 1656 01/23/21 0615  BNP 408.0* 424.4* 371.6*   Basic Metabolic Panel: Recent Labs  Lab 01/23/21 0615 01/24/21 0509 01/25/21 0516 01/26/21 0504 01/27/21 0509 01/28/21 0154  NA 133* 138 133* 134* 132* 136  K 4.4 4.9 4.4 4.6 5.1 4.5  CL 90* 92* 88* 91* 90* 94*  CO2 35* 37* 36* 34* 30 31  GLUCOSE 104* 103* 94 94 93 102*  BUN 91* 88* 93* 87* 85* 89*  CREATININE 1.62* 1.46* 1.91* 1.63* 1.72* 1.72*  CALCIUM 8.9 9.3 8.7* 8.7* 8.7* 8.6*  MG 1.8 1.9  --   --  2.0 2.0  PHOS 3.2 3.5  --   --  4.1 4.0   Liver Function Tests: Recent Labs  Lab 01/23/21 0615  AST 34  ALT 29  ALKPHOS 39  BILITOT 0.4  PROT 6.5  ALBUMIN 3.3*   No results for input(s): LIPASE, AMYLASE in the last 168 hours. No results for input(s): AMMONIA in the last 168 hours. CBC: Recent Labs  Lab 01/22/21 0439 01/23/21 0615 01/25/21 0516 01/26/21 0504 01/27/21 0658 01/28/21 0154  WBC 7.2 7.5  --  8.7 7.8 7.5  HGB 8.5* 8.9* 8.7* 8.5* 8.3* 8.0*  HCT 27.7* 29.6* 28.2* 27.6* 26.8* 25.8*  MCV 89.4 89.7  --  89.9 89.6 90.5  PLT 168 180  --  173 162 169   Cardiac Enzymes: No results for input(s): CKTOTAL, CKMB, CKMBINDEX, TROPONINI in the last 168 hours. BNP: Invalid input(s): POCBNP CBG: Recent Labs  Lab 01/27/21 1130 01/27/21 1903 01/28/21 0158 01/28/21 0518 01/28/21 1219  GLUCAP 184* 132* 111* 109* 111*   D-Dimer No results for input(s): DDIMER in the last 72 hours. Hgb A1c No results for input(s): HGBA1C in the last 72 hours. Lipid Profile No  results for input(s): CHOL, HDL, LDLCALC, TRIG, CHOLHDL, LDLDIRECT in the last 72 hours. Thyroid function studies No results for input(s): TSH, T4TOTAL, T3FREE, THYROIDAB in the last 72 hours.  Invalid input(s): FREET3 Anemia work up No results for input(s):  VITAMINB12, FOLATE, FERRITIN, TIBC, IRON, RETICCTPCT in the last 72 hours. Urinalysis    Component Value Date/Time   COLORURINE YELLOW 01/18/2021 1901   APPEARANCEUR CLEAR 01/18/2021 1901   LABSPEC 1.011 01/18/2021 1901   PHURINE 5.0 01/18/2021 1901   GLUCOSEU NEGATIVE 01/18/2021 1901   GLUCOSEU NEG mg/dL 03/22/2010 2313   HGBUR NEGATIVE 01/18/2021 1901   HGBUR negative 05/27/2010 1122   BILIRUBINUR NEGATIVE 01/18/2021 1901   BILIRUBINUR negative 04/28/2019 1500   BILIRUBINUR neg 03/11/2013 1443   KETONESUR NEGATIVE 01/18/2021 1901   PROTEINUR NEGATIVE 01/18/2021 1901   UROBILINOGEN 0.2 04/28/2019 1500   UROBILINOGEN 0.2 03/27/2012 1201   NITRITE NEGATIVE 01/18/2021 1901   LEUKOCYTESUR NEGATIVE 01/18/2021 1901   Sepsis Labs Invalid input(s): PROCALCITONIN,  WBC,  LACTICIDVEN Microbiology Recent Results (from the past 240 hour(s))  Resp Panel by RT-PCR (Flu A&B, Covid) Nasopharyngeal Swab     Status: None   Collection Time: 01/18/21  5:08 PM   Specimen: Nasopharyngeal Swab; Nasopharyngeal(NP) swabs in vial transport medium  Result Value Ref Range Status   SARS Coronavirus 2 by RT PCR NEGATIVE NEGATIVE Final    Comment: (NOTE) SARS-CoV-2 target nucleic acids are NOT DETECTED.  The SARS-CoV-2 RNA is generally detectable in upper respiratory specimens during the acute phase of infection. The lowest concentration of SARS-CoV-2 viral copies this assay can detect is 138 copies/mL. A negative result does not preclude SARS-Cov-2 infection and should not be used as the sole basis for treatment or other patient management decisions. A negative result may occur with  improper specimen collection/handling, submission of specimen other than nasopharyngeal swab, presence of viral mutation(s) within the areas targeted by this assay, and inadequate number of viral copies(<138 copies/mL). A negative result must be combined with clinical observations, patient history, and  epidemiological information. The expected result is Negative.  Fact Sheet for Patients:  EntrepreneurPulse.com.au  Fact Sheet for Healthcare Providers:  IncredibleEmployment.be  This test is no t yet approved or cleared by the [place] FDA and  has been authorized for detection and/or diagnosis of SARS-CoV-2 by FDA under an Emergency Use Authorization (EUA). This EUA will remain  in effect (meaning this test can be used) for the duration of the COVID-19 declaration under Section 564(b)(1) of the Act, 21 U.S.C.section 360bbb-3(b)(1), unless the authorization is terminated  or revoked sooner.       Influenza A by PCR NEGATIVE NEGATIVE Final   Influenza B by PCR NEGATIVE NEGATIVE Final    Comment: (NOTE) The Xpert Xpress SARS-CoV-2/FLU/RSV plus assay is intended as an aid in the diagnosis of influenza from Nasopharyngeal swab specimens and should not be used as a sole basis for treatment. Nasal washings and aspirates are unacceptable for Xpert Xpress SARS-CoV-2/FLU/RSV testing.  Fact Sheet for Patients: EntrepreneurPulse.com.au  Fact Sheet for Healthcare Providers: IncredibleEmployment.be  This test is not yet approved or cleared by the [place] FDA and has been authorized for detection and/or diagnosis of SARS-CoV-2 by FDA under an Emergency Use Authorization (EUA). This EUA will remain in effect (meaning this test can be used) for the duration of the COVID-19 declaration under Section 564(b)(1) of the Act, 21 U.S.C. section 360bbb-3(b)(1), unless the authorization is terminated or revoked.  Performed at [facility],  [place] [address], [place], [place] [address]   MRSA PCR Screening     Status: None   Collection Time: 01/18/21  9:58 PM   Specimen: Nasal Mucosa; Nasopharyngeal  Result Value Ref Range Status   MRSA by PCR NEGATIVE NEGATIVE Final    Comment:        The  GeneXpert MRSA Assay (FDA approved for NASAL specimens only), is one component of a comprehensive MRSA colonization surveillance program. It is not intended to diagnose MRSA infection nor to guide or monitor treatment for MRSA infections. Performed at [facility], [place] [address], [place],  [address]      Time coordinating discharge: Over 30 minutes  SIGNED:   Shawna Clamp, MD  [facility] 01/28/2021, 1:23 PM Pager   If 7PM-7AM, please contact night-coverage [email or web address]

## 2021-01-31 ENCOUNTER — Encounter: Payer: Self-pay | Admitting: Adult Health

## 2021-01-31 ENCOUNTER — Other Ambulatory Visit: Payer: Self-pay | Admitting: *Deleted

## 2021-01-31 ENCOUNTER — Non-Acute Institutional Stay (SKILLED_NURSING_FACILITY): Payer: Medicare Other | Admitting: Adult Health

## 2021-01-31 ENCOUNTER — Other Ambulatory Visit (HOSPITAL_COMMUNITY)
Admission: RE | Admit: 2021-01-31 | Discharge: 2021-01-31 | Disposition: A | Discharge status: Home / Self Care | Payer: Medicare Other | Source: Skilled Nursing Facility | Attending: Internal Medicine | Admitting: Internal Medicine

## 2021-01-31 DIAGNOSIS — I130 Hypertensive heart and chronic kidney disease with heart failure and stage 1 through stage 4 chronic kidney disease, or unspecified chronic kidney disease: Secondary | ICD-10-CM | POA: Diagnosis not present

## 2021-01-31 DIAGNOSIS — M1A30X0 Chronic gout due to renal impairment, unspecified site, without tophus (tophi): Secondary | ICD-10-CM

## 2021-01-31 DIAGNOSIS — D631 Anemia in chronic kidney disease: Secondary | ICD-10-CM

## 2021-01-31 DIAGNOSIS — J42 Unspecified chronic bronchitis: Secondary | ICD-10-CM

## 2021-01-31 DIAGNOSIS — I5022 Chronic systolic (congestive) heart failure: Secondary | ICD-10-CM

## 2021-01-31 DIAGNOSIS — I700 Atherosclerosis of aorta: Secondary | ICD-10-CM

## 2021-01-31 DIAGNOSIS — I442 Atrioventricular block, complete: Secondary | ICD-10-CM

## 2021-01-31 DIAGNOSIS — I4819 Other persistent atrial fibrillation: Secondary | ICD-10-CM

## 2021-01-31 DIAGNOSIS — N179 Acute kidney failure, unspecified: Secondary | ICD-10-CM | POA: Diagnosis not present

## 2021-01-31 DIAGNOSIS — R569 Unspecified convulsions: Secondary | ICD-10-CM

## 2021-01-31 DIAGNOSIS — G9009 Other idiopathic peripheral autonomic neuropathy: Secondary | ICD-10-CM

## 2021-01-31 DIAGNOSIS — N184 Chronic kidney disease, stage 4 (severe): Secondary | ICD-10-CM

## 2021-01-31 DIAGNOSIS — Z95810 Presence of automatic (implantable) cardiac defibrillator: Secondary | ICD-10-CM | POA: Diagnosis not present

## 2021-01-31 DIAGNOSIS — I5043 Acute on chronic combined systolic (congestive) and diastolic (congestive) heart failure: Secondary | ICD-10-CM

## 2021-01-31 DIAGNOSIS — Z952 Presence of prosthetic heart valve: Secondary | ICD-10-CM | POA: Diagnosis not present

## 2021-01-31 DIAGNOSIS — I4811 Longstanding persistent atrial fibrillation: Secondary | ICD-10-CM | POA: Insufficient documentation

## 2021-01-31 DIAGNOSIS — E785 Hyperlipidemia, unspecified: Secondary | ICD-10-CM

## 2021-01-31 DIAGNOSIS — E890 Postprocedural hypothyroidism: Secondary | ICD-10-CM

## 2021-01-31 DIAGNOSIS — J9601 Acute respiratory failure with hypoxia: Secondary | ICD-10-CM

## 2021-01-31 DIAGNOSIS — J9602 Acute respiratory failure with hypercapnia: Secondary | ICD-10-CM

## 2021-01-31 LAB — PROTIME-INR
INR: 2.1 — ABNORMAL HIGH (ref 0.8–1.2)
Prothrombin Time: 23.7 seconds — ABNORMAL HIGH (ref 11.4–15.2)

## 2021-01-31 NOTE — Patient Outreach (Addendum)
Member screened for potential [facility] Care Management needs. Per [place] (PatientPing) member resides in [facility].   Communication sent to facility SW to inquire about transition plans.   [name] is followed by [facility] Coordination team with PCP office. Communication sent to [facility] RN to make aware writer will follow while in SNF.   Addendum: Update received from SNF SW indicating she has spoken to [name] [name]. Member will return home post SNF. She has caregiver assistance during the day. Lives with nephew. Writer will plan outreach as appropriate since [name] just recently admitted to [facility]. Will continue to follow.    Marthenia Rolling, MSN, RN,BSN [place] Acute Care Coordinator [phone] [facility]) [phone]  (Toll free office)

## 2021-01-31 NOTE — Progress Notes (Signed)
Location:  [facility] Room Number: 159 Place of Service:  SNF (31)   CODE STATUS: full code   Allergies  Allergen Reactions  . Penicillins Other (See Comments)    Bruise    . Aspirin Other (See Comments)    On coumadin  . Fentanyl Dermatitis    Rash with patch   . Niacin Itching    Chief Complaint  Patient presents with  . Hospitalization Follow-up    HPI:  She is a 87 year old woman who has been hospitalized from 01-18-21 through 01-28-21. Her medical history includes:  COPD; diastolic and systolic heart failure; complete heart block status post pace maker ICD; afib; seizure disorder. She presented to the ED for acute hypoxic and hypercapnic respiratory failure; COPD; and CHF. She has 14 pounds since her admission. She was initially placed on BIPAP improved and now off BIPAP. She received IV solumedrol; IV lasix. Her EF is 25-30%. She has been changed to po lasix. Her serum creatinine is returning to her baseline; the mid 1.50. she has developed significant left arm thrombophlebitis due to IV infiltration; with bruising present. She was negative for dvt.   she is here for short term rehab with her goal to return home with family. She is on chronic 02. She does have some mild wheezing present. She states that she does have some fatigue. She denies any cough or shortness of breath. She will continue to be followed for her chronic illnesses including: Acute on chronic systolic and diastolic congestive heart failure:    Hypertensive heart and kidney disease with chronic systolic congestive heart failure with stage 4 chronic kidney disease:    Complete heart block:   Aortic atherosclerosis:    Past Medical History:  Diagnosis Date  . Acute on chronic diastolic CHF (congestive heart failure) ([facility]) 05/19/2012  . Acute on chronic respiratory failure with hypoxia ([place]) 01/31/2020  . Adenomatous polyp of colon 12/16/2002   Dr. [name]/[facility]  . Allergy   . Calculus of gallbladder with chronic cholecystitis without obstruction 02/09/2009   Qualifier: Diagnosis of  By: [name], [name]    . Cataract   . CHB (complete heart block) ([place]) 10/07/2014      . CHF (congestive heart failure) ([place])    a.  EF previously reduced to 25-30% b. EF improved to 60-65% by echo in 10/2017.   Marland Kitchen Chronic gout due to renal impairment    Per [facility] Records [facility]   . Chronic kidney disease, stage I 2006  . Cognitive communication deficit    Per [facility]   . Constipation       . Convulsion ([place])    Per [facility]   . COPD (chronic obstructive pulmonary disease) ([place])       . DJD (degenerative joint disease) of lumbar spine   . DM (diabetes mellitus) ([place]) 2006  . Dysphagia, unspecified    Per [facility] Records [facility]   . Esophageal reflux   . Glaucoma   . Gout       . H/O adenomatous polyp of colon 05/24/2012   2004 in [place] colonoscopy 2008 normal    . H/O heart valve replacement with mechanical valve    a. s/p mechanical AVR in 2001 and mechanical MVR in 2004.  Marland Kitchen H/O wisdom tooth  extraction   . HIP PAIN, RIGHT 04/23/2009   Qualifier: Diagnosis of  By: [name], [name]    . Hyperkalemia    Per [facility]   . Hyperlipemia   . Hypertensive heart and kidney disease with HF and with CKD stage I-IV ([place])    Per [facility]   . IDA (iron deficiency anemia)    Parenteral iron/[name]  . Insomnia       . Localized edema    Left arm, Per [facility]   . Long term (current) use of anticoagulants    Per [facility]   . Nausea  without vomiting 06/07/2010   Qualifier: Diagnosis of  By: [name], [name]    . Non-ischemic cardiomyopathy ([place])    a. s/p MDT CRTD  . Obesity, unspecified   . Oxygen deficiency 2014   nocturnal;  . Papilloma of breast 03/06/2012   This was diagnosed as a left breast papilloma by ultrasound characteristics and symptoms in 2010. Because of possible medical risk factors no surgical intervention has been done and it did doing annual followups. The area has been stable by physical examination and mammograms and ultrasound since 2010.   . Presence of cardiac pacemaker    Per [facility] Records [facility]   . Spinal stenosis of lumbar region 02/19/2013  . Spondylolisthesis   . Spondylosis   . Thyroid cancer ([place])    remote thyroidectomy, no recurrence, pt denies in 2016 thyroid cancer  . Type 2 diabetes mellitus with hyperglycemia ([place]) 07/15/2010  . Type 2 diabetes mellitus with hypoglycemia without coma ([place])    Per [facility]   . Unspecified chronic bronchitis ([place])    Per [facility]   . Unspecified hypothyroidism       . Urinary tract infection, site not specified    Per [facility] Records [facility]   . 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 Vertigo         Past Surgical History:  Procedure Laterality Date  . AORTIC AND MITRAL VALVE REPLACEMENT  2001  . BI-VENTRICULAR IMPLANTABLE CARDIOVERTER DEFIBRILLATOR UPGRADE N/A 01/18/2015   Procedure: BI-VENTRICULAR IMPLANTABLE CARDIOVERTER DEFIBRILLATOR UPGRADE;  Surgeon: Evans Lance, MD;  Location: [facility] CATH LAB;  Service: Cardiovascular;  Laterality: N/A;  . BIV ICD GENERATOR CHANGEOUT N/A 01/30/2019   Procedure: BIV ICD GENERATOR CHANGEOUT;  Surgeon: Evans Lance, MD;  Location: [facility] CV LAB;  Service: Cardiovascular;  Laterality:  N/A;  . CARDIAC CATHETERIZATION    . CARDIAC VALVE REPLACEMENT    . CARDIOVERSION N/A 11/13/2016   Procedure: CARDIOVERSION;  Surgeon: Sanda Klein, MD;  Location: [facility] ENDOSCOPY;  Service: Cardiovascular;  Laterality: N/A;  . COLONOSCOPY  06/12/2007   [place]k- Normal rectum/Normal colon  . COLONOSCOPY  12/16/02   small hemorrhoids  . COLONOSCOPY  06/20/2012   Procedure: COLONOSCOPY;  Surgeon: Daneil Dolin, MD;  Location: [facility] ENDO SUITE;  Service: Endoscopy;  Laterality: N/A;  10:45  . defibrillator implanted 2006    . DOPPLER ECHOCARDIOGRAPHY  2012  . DOPPLER ECHOCARDIOGRAPHY  05,06,07,08,09,2011  . ESOPHAGOGASTRODUODENOSCOPY  06/12/2007   Rourk- normal esophagus, small hiatal hernia, otherwise normal stomach, D1, D2  . EYE SURGERY Right 2005  .  EYE SURGERY Left 2006  . ICD LEAD REMOVAL Left 02/08/2015   Procedure: ICD LEAD REMOVAL;  Surgeon: Evans Lance, MD;  Location: [facility] OR;  Service: Cardiovascular;  Laterality: Left;  "Will plan extraction and insertion of a BiV PM"  **Dr. Roxan Hockey backing up case**  . IMPLANTABLE CARDIOVERTER DEFIBRILLATOR (ICD) GENERATOR CHANGE Left 02/08/2015   Procedure: ICD GENERATOR CHANGE;  Surgeon: Evans Lance, MD;  Location: [facility];  Service: Cardiovascular;  Laterality: Left;  . INSERT / REPLACE / REMOVE PACEMAKER    . PACEMAKER INSERTION  May 2016  . pacemaker placed  2004  . right breast cyst removed     benign   . right cataract removed     2005  . TEE WITHOUT CARDIOVERSION N/A 11/13/2016   Procedure: TRANSESOPHAGEAL ECHOCARDIOGRAM (TEE);  Surgeon: Sanda Klein, MD;  Location: [facility] ENDOSCOPY;  Service: Cardiovascular;  Laterality: N/A;  . THYROIDECTOMY      Social History   Socioeconomic History  . Marital status: Widowed    Spouse name: Not on file  . Number of children: 0  . Years of education: Not on file  . Highest education level: Not on file  Occupational History  . Occupation: retired    Fish farm manager: RETIRED  Tobacco Use  .  Smoking status: Former Smoker    Packs/day: 0.75    Years: 40.00    Pack years: 30.00    Types: Cigarettes    Quit date: 03/08/1987    Years since quitting: 33.9  . Smokeless tobacco: Never Used  Vaping Use  . Vaping Use: Never used  Substance and Sexual Activity  . Alcohol use: No    Alcohol/week: 0.0 standard drinks  . Drug use: No  . Sexual activity: Not Currently  Other Topics Concern  . Not on file  Social History Narrative   From [place] (2004)   Social Determinants of Health   Financial Resource Strain: Low Risk   . Difficulty of Paying Living Expenses: Not hard at all  Food Insecurity: No Food Insecurity  . Worried About Charity fundraiser in the Last Year: Never true  . Ran Out of Food in the Last Year: Never true  Transportation Needs: No Transportation Needs  . Lack of Transportation (Medical): No  . Lack of Transportation (Non-Medical): No  Physical Activity: Inactive  . Days of Exercise per Week: 0 days  . Minutes of Exercise per Session: 0 min  Stress: No Stress Concern Present  . Feeling of Stress : Only a little  Social Connections: Moderately Isolated  . Frequency of Communication with Friends and Family: More than three times a week  . Frequency of Social Gatherings with Friends and Family: Twice a week  . Attends Religious Services: More than 4 times per year  . Active Member of Clubs or Organizations: No  . Attends Archivist Meetings: Never  . Marital Status: Widowed  Intimate Partner Violence: Not on file   Family History  Problem Relation Age of Onset  . Pancreatic cancer Mother   . Heart disease Father   . Heart disease Sister   . Heart attack Sister   . Heart disease Brother   . Diabetes Brother   . Heart disease Brother   . Diabetes Brother   . Hypertension Brother   . Lung cancer Brother       VITAL SIGNS BP 137/64   Pulse 60   Temp 98.1 F (36.7 C)   Resp 20  Ht 5\' 7"  (1.702 m)   Wt 191 lb 9.6 oz (86.9 kg)    SpO2 94%   BMI 30.01 kg/m   Outpatient Encounter Medications as of 01/31/2021  Medication Sig Note  . acetaminophen (TYLENOL) 500 MG tablet Take 500 mg by mouth every 6 (six) hours as needed.   Marland Kitchen albuterol (PROVENTIL) (2.5 MG/3ML) 0.083% nebulizer solution Take 2.5 mg by nebulization in the morning and at bedtime.   Marland Kitchen albuterol (VENTOLIN HFA) 108 (90 Base) MCG/ACT inhaler Inhale 1 puff into the lungs every 6 (six) hours as needed for wheezing or shortness of breath.   . allopurinol (ZYLOPRIM) 100 MG tablet Take 100 mg by mouth daily.   . benzonatate (TESSALON) 100 MG capsule Take 100 mg by mouth 3 (three) times daily as needed for cough.   . cholecalciferol (VITAMIN D3) 25 MCG (1000 UNIT) tablet Take 1 tablet (1,000 Units total) by mouth in the morning.   . fluticasone (FLONASE) 50 MCG/ACT nasal spray Place 2 sprays into both nostrils daily.   . fluticasone-salmeterol (WIXELA INHUB) 250-50 MCG/ACT AEPB Inhale 1 puff into the lungs in the morning and at bedtime.   . folic acid (FOLVITE) 1 MG tablet TAKE 1 TABLET(1 MG) BY MOUTH DAILY   . furosemide (LASIX) 40 MG tablet Take 1 tablet (40 mg total) by mouth daily. Advised to take 40 mg Lasix once a day alternating with 40 mg Lasix twice daily.   Marland Kitchen gabapentin (NEURONTIN) 300 MG capsule Take 300 mg by mouth 2 (two) times daily.   . hydrALAZINE (APRESOLINE) 10 MG tablet Take 1 tablet (10 mg total) by mouth every 8 (eight) hours.   . isosorbide mononitrate (IMDUR) 30 MG 24 hr tablet Take 0.5 tablets (15 mg total) by mouth daily.   Marland Kitchen levETIRAcetam (KEPPRA) 500 MG tablet Take 500 mg by mouth 2 (two) times daily.   . Liniments (SALONPAS PAIN RELIEF PATCH EX) Apply 1 patch topically daily as needed (pain).   . metoprolol succinate (TOPROL-XL) 50 MG 24 hr tablet Take 1 tablet (50 mg total) by mouth daily. Take with or immediately following a meal.   . nitroGLYCERIN (NITROSTAT) 0.4 MG SL tablet DISSOLVE 1 TABLET SUBLINGUALLY AS NEEDED FOR CHEST PAIN, MAY  REPEAT EVERY 5 MINUTES. AFTER 3 CALL 911. (Patient taking differently: Place 0.4 mg under the tongue every 5 (five) minutes as needed for chest pain.)   . NON FORMULARY Diet Change: Soft diet with dys 2 (ground) meats, thin liquids (NAS, cons CHO, heart healthy)   . ondansetron (ZOFRAN-ODT) 4 MG disintegrating tablet TAKE (1) TABLET BY MOUTH EVERY EIGHT HOURS AS NEEDED FOR NAUSEA OR VOMITING (Patient taking differently: Take 4 mg by mouth every 8 (eight) hours as needed for nausea or vomiting.)   . OXYGEN Inhale 3 L into the lungs continuous.   . polyethylene glycol (MIRALAX / GLYCOLAX) 17 g packet Take 17 g by mouth daily as needed. (Patient taking differently: Take 17 g by mouth daily as needed for mild constipation.)   . potassium chloride SA (KLOR-CON) 20 MEQ tablet TAKE ONE TABLET BY MOUTH ONCE DAILY. (Patient taking differently: Take 20 mEq by mouth daily.)   . pravastatin (PRAVACHOL) 40 MG tablet TAKE 1 TABLET BY MOUTH EVERY EVENING   . SYNTHROID 88 MCG tablet TAKE 1 TABLET BEFORE BREAKFAST. (Patient taking differently: Take 88 mcg by mouth daily before breakfast.)   . UNABLE TO FIND Compression Stockings R-  Ankle  26.5cm   Calf 38cm  Leg  44cm L-  Ankle  27.5 cm   Calf 39cm  Leg  44cm (Patient taking differently: Compression Stockings R-  Ankle  26.5cm   Calf 38cm  Leg  44cm L-  Ankle  27.5 cm   Calf 39cm  Leg  44cm)   . warfarin (COUMADIN) 10 MG tablet TAKE (1) TABLET BY MOUTH DAILY AT 4PM (Patient taking differently: Take 10 mg by mouth daily at 6 (six) AM.)    No facility-administered encounter medications on file as of 01/31/2021.     SIGNIFICANT DIAGNOSTIC EXAMS  TODAY  01-18-21: chest x-ray: Cardiomegaly with mild central vascular congestion. No focal consolidation  01-18-21: renal ultrasound:  1. Mild right hydronephrosis. 2. Right renal parenchymal thinning and increased echogenicity consistent with chronic medical renal disease. 3. Significantly limited evaluation of the  left kidney, no gross hydronephrosis  01-21-21: 2-d echo:  Left ventricular ejection fraction, by estimation, is 25 to 30%. The  left ventricle has severely decreased function. The left ventricle  demonstrates global hypokinesis. The left ventricular internal cavity size  was moderately dilated. There is mild  concentric left ventricular hypertrophy. Left ventricular diastolic  parameters are indeterminate.   01-27-21 left elbow x-ray:  Soft tissue swelling. No fracture or dislocation. No appreciable arthropathy. Atherosclerotic arterial vascular calcifications noted.   TODAY  01-18-21: wbc 5.1; hgb 10.5; hct 35.9; mcv 93.0 plt 166; glucose 104; bun 87; creat 2.84; k+ 4.6; na++ 139; ca 9.5 GFR 16; phos 4.6 albumin 3.6; INR 1.6 01-21-21: wbc 7.5; hgb 8.5; hct 27.9; mcv 89.7 plt 157; glucose 105; bun 91; creat 1.93; k+ 4.4; na++ 139; ca 8.9; GFR 25;  01-28-21: wbc 7.5; hgb 8.0; hct 25.8; mcv 90.5 plt 169; glucose 102; bun 89; creat 1.72; k+ 4.5; na++ 136; ca 8.6 GFR 29; mag 2.0; phos 4.0 01-31-21; INR 1.2 (goal 2.5-3.5)   Review of Systems  Constitutional: Positive for malaise/fatigue.  Respiratory: Positive for shortness of breath. Negative for cough.   Cardiovascular: Negative for chest pain, palpitations and leg swelling.  Gastrointestinal: Negative for abdominal pain, constipation and heartburn.  Musculoskeletal: Negative for back pain, joint pain and myalgias.       Has chronic left arm edema   Skin: Negative.   Neurological: Negative for dizziness.  Psychiatric/Behavioral: The patient is not nervous/anxious.     Physical Exam Constitutional:      General: She is not in acute distress.    Appearance: She is well-developed. She is obese. She is not diaphoretic.  Neck:     Thyroid: No thyromegaly.  Cardiovascular:     Rate and Rhythm: Normal rate and regular rhythm.     Heart sounds: Normal heart sounds.     Comments: Psychologist, forensic ICD  MVP Pulmonary:     Effort: Pulmonary effort is  normal. No respiratory distress.     Breath sounds: Wheezing present.     Comments: 02 dependent  Abdominal:     General: Bowel sounds are normal. There is no distension.     Palpations: Abdomen is soft.     Tenderness: There is no abdominal tenderness.  Musculoskeletal:        General: Normal range of motion.     Cervical back: Neck supple.     Right lower leg: No edema.     Left lower leg: No edema.  Lymphadenopathy:     Cervical: No cervical adenopathy.  Skin:    General: Skin is warm and dry.     Comments: Has bruising  present left upper extremity  Has left upper extremity swelling present.   Neurological:     Mental Status: She is alert and oriented to person, place, and time.  Psychiatric:        Mood and Affect: Mood normal.      ASSESSMENT/ PLAN:  TODAY  1. Acute on chronic systolic and diastolic congestive heart failure: EF 25-30% (01-21-21); is status post ICD placement : she is currently stable. Will continue the following: lasix 40 mg daily and twice daily every other day; k+ 20 meq daily  hydralazine 10 mg every 8 hours; imdur 15 mg daily; toprol xl 50 mg daily has prn ntg.   2. Hypertensive heart and kidney disease with chronic systolic congestive heart failure with stage 4 chronic kidney disease: is stable b/p 137/64: will continue the following: hydralazine 10 mg every 8 hours; toprol xl 50 mg daily   3. Complete heart block: is status post pace maker: will monitor   4. Aortic atherosclerosis: (ct 06-15-20) will monitor  5. Persistent atrial fibrillation: status post pace maker; s/p mvr: heart rate is stable will continue toprol xl 50 mg daily for rate control; is on long term coumadin therapy: INR goal 2.5-3.5 current INR 2.1; will continue coumadin 10 mg daily with an extra 5 mg daily; will repeat INR 02-03-21.   6. Seizure: is stable no reports of seizure activity: will continue keppra 500 mg twice daily   7. Acute renal failure superimposed on stage 4  chronic kidney disease unspecified acute renal failure type: is stable bun 89; creat 1.72; GFR 29   8. Chronic bronchitis unspecified bronchitis type acute respiratory failure with hypoxia and hypercapnia : is 02 dependent; will continue wixela inhub: 250/50 mcg 1 puff twice daily flonase daily; albuterol neb twice daily inhaler every 6 hours as needed   9. Acquired hypothyroidism: is stable will continue synthroid 88 mcg daily   10. Hyperlipidemia LDL <70: is stable will continue pravachol 40 mg daily   11 . Other idiopathic peripheral autonomic neuropathy: is stable no uncontrolled pain; will continue gabapentin 300 mg twice daily   12. Anemia due to stage 4 chronic kidney disease: is without change hgb 8.0  Will check cbc; cmp.   Time spent with patient: 45 minutes: coordination  medications; plan of care goals of care; therapy.   Ok Edwards NP [facility]  Contact [phone] Monday through Friday 8am- 5pm  After hours call [phone]

## 2021-02-02 ENCOUNTER — Encounter: Payer: Self-pay | Admitting: Internal Medicine

## 2021-02-02 ENCOUNTER — Non-Acute Institutional Stay (SKILLED_NURSING_FACILITY): Payer: Medicare Other | Admitting: Internal Medicine

## 2021-02-02 DIAGNOSIS — N179 Acute kidney failure, unspecified: Secondary | ICD-10-CM | POA: Diagnosis not present

## 2021-02-02 DIAGNOSIS — J9602 Acute respiratory failure with hypercapnia: Secondary | ICD-10-CM

## 2021-02-02 DIAGNOSIS — J9601 Acute respiratory failure with hypoxia: Secondary | ICD-10-CM

## 2021-02-02 DIAGNOSIS — N184 Chronic kidney disease, stage 4 (severe): Secondary | ICD-10-CM

## 2021-02-02 DIAGNOSIS — I5043 Acute on chronic combined systolic (congestive) and diastolic (congestive) heart failure: Secondary | ICD-10-CM | POA: Diagnosis not present

## 2021-02-02 NOTE — Patient Instructions (Signed)
See assessment and plan under each diagnosis in the problem list and acutely for this visit 

## 2021-02-02 NOTE — Assessment & Plan Note (Signed)
Clinically compensated at this time.  Nonpitting edema is present.  She has low-grade rhonchi and rales at the bases but clinically no sign of decompensated congestive heart failure.

## 2021-02-02 NOTE — Progress Notes (Signed)
NURSING HOME LOCATION:  [facility] ROOM NUMBER:  [id number]  CODE STATUS:  Full Code  PCP:  [name] MD  This is a comprehensive admission note to this SNFperformed on this date less than 30 days from date of admission. Included are preadmission medical/surgical history; reconciled medication list; family history; social history and comprehensive review of systems.  Corrections and additions to the records were documented. Comprehensive physical exam was also performed. Additionally a clinical summary was entered for each active diagnosis pertinent to this admission in the Problem List to enhance continuity of care.  HPI: Patient was hospitalized 4/26 - 01/28/2021 presenting to the ED with progressive shortness of breath.  Patient was lethargic at admission; ABGs revealed pH of 7.2 and PCO2 of 80.  Chest x-ray revealed pulmonary congestion in the context of history of combined systolic and diastolic heart failure.  BiPAP was initiated with aggressive pulmonary toilet, steroid burst, and IV Lasix.  Admitting diagnosis was heart failure with COPD exacerbation. Cardiology consulted and IV diuresis continued resulting in 13 pound and 12.2 L negative balance during the hospitalization.  Echo revealed LVEF of 25-30%.  Heparin bridging was necessary because of labile PT/INRs. Course was complicated by edema of the left upper extremity related to IV infiltration.  Venous Dopplers were negative for DVT PE or superficial thrombophlebitis.  Compresses were applied locally with improvement.   AKI superimposed on CKD did improve.  Peak creatinine was 2.27 but dropped to 1.46.  It did increase to 1.92 in the context of possible overdiuresis.  Diuretics were held and creatinine improved to 1.63.  Final creatinine prior to discharge was 1.72. The acute metabolic encephalopathy was attributed to the hypercarbia.  This improved with improvement in the heart failure and COPD exacerbation.  Past  medical and surgical history: Includes history of gout, history of seizure disorder, history of chronic respiratory failure with hypoxia, vitamin D deficiency, extrinsic rhinitis, essential hypertension, dyslipidemia, history of thyroid cancer and subsequent hypothyroidism, history of vertigo, diabetes with peripheral neuropathy and CKD, lumbar spinal stenosis, nonischemic cardiomyopathy, and history of chronic diastolic congestive heart failure. Surgeries and procedures include aortic and mitral valve replacement, BiV ICD generator placement, cardioversion, and thyroidectomy.  Social history: Nondrinker; former 30-pack-year smoker.  She is retired Marine scientist and also served on [employer] for a number of years.  Family history: Extensive history reviewed; strongly positive for heart disease.   Review of systems: When asked why she had been in the hospital she stated "I could not walk, legs gave out".  She gave the date as Wednesday, May?,  2002.  She knew the [place].  When asked what was going on in [place] her response was "ain't over there".  She does validate some exertional dyspnea and persistent swelling of the left upper extremity.  She also describes intermittent constipation.  She has intermittent numbness in her feet.  Constitutional: No fever Eyes: No redness, discharge, pain, vision change ENT/mouth: No nasal congestion, purulent discharge, earache, change in hearing, sore throat  Cardiovascular: No chest pain, palpitations, paroxysmal nocturnal dyspnea, claudication  Respiratory: No cough, sputum production, hemoptysis, DOE, significant snoring, apnea  Gastrointestinal: No heartburn, dysphagia, abdominal pain, nausea /vomiting, rectal bleeding, melena Genitourinary: No dysuria, hematuria, pyuria, incontinence, nocturia Musculoskeletal: No joint stiffness, joint swelling, weakness, pain Dermatologic: No rash, pruritus, change in appearance of skin Neurologic: No dizziness,  headache, syncope, seizures Psychiatric: No significant anxiety, depression, insomnia, anorexia Endocrine: No change in hair/skin/nails, excessive thirst, excessive  hunger, excessive urination  Hematologic/lymphatic: No significant bruising, lymphadenopathy, abnormal bleeding Allergy/immunology: No itchy/watery eyes, significant sneezing, urticaria, angioedema  Physical exam:  Pertinent or positive findings: She appears her age and somewhat chronically ill.  Hair is disheveled and she has patchy alopecia.  The lids are puffy especially the upper lids with some asymmetry.  There is ptosis on the left.  Proptosis is suggested. Nasal O2 in place. The maxilla is edentulous.  She is missing multiple lower teeth but not wearing the partial.  Her tongue exhibits a side to side tremor.  When asked about this ,her response was "I do not want you to see my teeth".  She has low-grade rales and rhonchi at the bases with bronchovesicular character in the upper lobes.  She has a mechanical murmur with increased second heart sound.  She has nonpitting edema of the feet.  The left hand and forearm is visibly swollen.  Pedal pulses are decreased, especially the dorsalis pedis pulses.  Opposition to strength is fair.  General appearance:  no acute distress, increased work of breathing is present.   Lymphatic: No lymphadenopathy about the head, neck, axilla. Eyes: No conjunctival inflammation is present. There is no scleral icterus. Ears:  External ear exam shows no significant lesions or deformities.   Nose:  External nasal examination shows no deformity or inflammation. Nasal mucosa are pink and moist without lesions, exudates Neck:  No thyromegaly, masses, tenderness noted.    Heart:  No gallop, murmur, click, rub.  Lungs:  without wheezes, rubs. Abdomen: Bowel sounds are normal.  Abdomen is soft and nontender with no organomegaly, hernias, masses. GU: Deferred  Extremities:  No cyanosis, clubbing. Neurologic  exam: Balance, Rhomberg, finger to nose testing could not be completed due to clinical state Skin: Warm & dry w/o tenting. No significant lesions or rash.  See clinical summary under each active problem in the Problem List with associated updated therapeutic plan

## 2021-02-02 NOTE — Assessment & Plan Note (Signed)
Auscultation reveals bronchovesicular breath sounds except for low-grade rhonchi and rales at the extreme bases.  O2 sats are 95% on 3 L.  No change in present regimen indicated.

## 2021-02-02 NOTE — Assessment & Plan Note (Signed)
Creatinine peaked at 2.27 but improved with rehydration to 1.46.  Prior to discharge it had risen to 1.72.  Diuresis will be adjusted based on BMET and edema.

## 2021-02-03 ENCOUNTER — Other Ambulatory Visit: Payer: Self-pay | Admitting: Nurse Practitioner

## 2021-02-03 ENCOUNTER — Other Ambulatory Visit: Payer: Self-pay | Admitting: Family Medicine

## 2021-02-03 ENCOUNTER — Other Ambulatory Visit (HOSPITAL_COMMUNITY)
Admission: RE | Admit: 2021-02-03 | Discharge: 2021-02-03 | Disposition: A | Discharge status: Home / Self Care | Payer: Medicare Other | Source: Skilled Nursing Facility | Attending: Internal Medicine | Admitting: Internal Medicine

## 2021-02-03 DIAGNOSIS — Z7901 Long term (current) use of anticoagulants: Secondary | ICD-10-CM | POA: Insufficient documentation

## 2021-02-03 LAB — PROTIME-INR
INR: 2.4 — ABNORMAL HIGH (ref 0.8–1.2)
Prothrombin Time: 26.3 seconds — ABNORMAL HIGH (ref 11.4–15.2)

## 2021-02-04 ENCOUNTER — Other Ambulatory Visit (HOSPITAL_COMMUNITY)
Admission: RE | Admit: 2021-02-04 | Discharge: 2021-02-04 | Disposition: A | Discharge status: Home / Self Care | Payer: Medicare Other | Source: Skilled Nursing Facility | Attending: Adult Health | Admitting: Adult Health

## 2021-02-04 ENCOUNTER — Non-Acute Institutional Stay (SKILLED_NURSING_FACILITY): Payer: Medicare Other | Admitting: Adult Health

## 2021-02-04 ENCOUNTER — Encounter: Payer: Self-pay | Admitting: Adult Health

## 2021-02-04 DIAGNOSIS — I4819 Other persistent atrial fibrillation: Secondary | ICD-10-CM | POA: Diagnosis not present

## 2021-02-04 DIAGNOSIS — I5043 Acute on chronic combined systolic (congestive) and diastolic (congestive) heart failure: Secondary | ICD-10-CM

## 2021-02-04 DIAGNOSIS — Z952 Presence of prosthetic heart valve: Secondary | ICD-10-CM | POA: Insufficient documentation

## 2021-02-04 LAB — PROTIME-INR
INR: 2.7 — ABNORMAL HIGH (ref 0.8–1.2)
Prothrombin Time: 28.6 seconds — ABNORMAL HIGH (ref 11.4–15.2)

## 2021-02-04 NOTE — Progress Notes (Signed)
Location:  [facility] Room Number: 159-P Place of Service:  SNF (31)   CODE STATUS: Full code  Allergies  Allergen Reactions  . Penicillins Other (See Comments)    Bruise    . Aspirin Other (See Comments)    On coumadin  . Fentanyl Dermatitis    Rash with patch   . Niacin Itching   Chief Complaint  Patient presents with  . Acute Visit    Weight gain and INR management      HPI:  She is presently taking coumadin 10 mg daily for MVR; with her INR at 2.7. her INR goal is 2.5-3.5.  She has gained weight from 191.9 pounds on 02-03-21; to 195.9 pounds 02-04-21. She denies any worsening shortness of breaht no cough. There are no reports of bleeding present. No reports of missed medications.   Past Medical History:  Diagnosis Date  . Acute on chronic diastolic CHF (congestive heart failure) ([place]) 05/19/2012  . Acute on chronic respiratory failure with hypoxia ([place]) 01/31/2020  . Adenomatous polyp of colon 12/16/2002   Dr. Collier Salina [name]/[facility]  . Allergy   . Calculus of gallbladder with chronic cholecystitis without obstruction 02/09/2009   Qualifier: Diagnosis of  By: [name] Cipro MD, [name] [name]    . Cataract   . CHB (complete heart block) ([place]) 10/07/2014      . CHF (congestive heart failure) ([place])    a.  EF previously reduced to 25-30% b. EF improved to 60-65% by echo in 10/2017.   Marland Kitchen Chronic gout due to renal impairment    Per [facility] Electronic [facility] Records [facility]   . Chronic kidney disease, stage I 2006  . Cognitive communication deficit    Per [facility] Records [facility]   . Constipation       . Convulsion ([place])    Per [facility] Records [facility]   . COPD (chronic obstructive pulmonary disease) ([place])       . DJD (degenerative joint disease) of lumbar spine   . DM (diabetes mellitus) ([place]) 2006  . Dysphagia, unspecified    Per  [facility] Records [facility]   . Esophageal reflux   . Glaucoma   . Gout       . H/O adenomatous polyp of colon 05/24/2012   2004 in [place] colonoscopy 2008 normal    . H/O heart valve replacement with mechanical valve    a. s/p mechanical AVR in 2001 and mechanical MVR in 2004.  Marland Kitchen H/O wisdom tooth extraction   . HIP PAIN, RIGHT 04/23/2009   Qualifier: Diagnosis of  By: [name] Cipro MD, [name] [name]    . Hyperkalemia    Per [facility] Records [facility]   . Hyperlipemia   . Hypertensive heart and kidney disease with HF and with CKD stage I-IV ([place])    Per [facility] Records [facility]   . IDA (iron deficiency anemia)    Parenteral iron/Dr. Tressie [name]  . Insomnia       . Localized edema    Left arm, Per [facility] Records [facility]   . Long term (current) use of anticoagulants    Per [facility] Records [facility]   . Nausea without vomiting 06/07/2010   Qualifier: Diagnosis of  By: [name] Cipro MD, [name] [name]    . Non-ischemic cardiomyopathy ([place])  a. s/p MDT CRTD  . Obesity, unspecified   . Oxygen deficiency 2014   nocturnal;  . Papilloma of breast 03/06/2012   This was diagnosed as a left breast papilloma by ultrasound characteristics and symptoms in 2010. Because of possible medical risk factors no surgical intervention has been done and it did doing annual followups. The area has been stable by physical examination and mammograms and ultrasound since 2010.   . Presence of cardiac pacemaker    Per [facility] Records [facility]   . Spinal stenosis of lumbar region 02/19/2013  . Spondylolisthesis   . Spondylosis   . Thyroid cancer ([place])    remote thyroidectomy, no recurrence, pt denies in 2016 thyroid cancer  . Type 2 diabetes mellitus with hyperglycemia ([place]) 07/15/2010  . Type 2  diabetes mellitus with hypoglycemia without coma ([place])    Per [facility] Records [facility]   . Unspecified chronic bronchitis ([place])    Per [facility] Records [facility]   . Unspecified hypothyroidism       . Urinary tract infection, site not specified    Per [facility] Records [facility]   . 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 Vertigo         Past Surgical History:  Procedure Laterality Date  . AORTIC AND MITRAL VALVE REPLACEMENT  2001  . BI-VENTRICULAR IMPLANTABLE CARDIOVERTER DEFIBRILLATOR UPGRADE N/A 01/18/2015   Procedure: BI-VENTRICULAR IMPLANTABLE CARDIOVERTER DEFIBRILLATOR UPGRADE;  Surgeon: [name] Lance, MD;  Location: [facility] CATH LAB;  Service: Cardiovascular;  Laterality: N/A;  . BIV ICD GENERATOR CHANGEOUT N/A 01/30/2019   Procedure: BIV ICD GENERATOR CHANGEOUT;  Surgeon: [name] Lance, MD;  Location: [facility] CV LAB;  Service: Cardiovascular;  Laterality: N/A;  . CARDIAC CATHETERIZATION    . CARDIAC VALVE REPLACEMENT    . CARDIOVERSION N/A 11/13/2016   Procedure: CARDIOVERSION;  Surgeon: [name] Klein, MD;  Location: [facility] ENDOSCOPY;  Service: Cardiovascular;  Laterality: N/A;  . COLONOSCOPY  06/12/2007   Rourk- Normal rectum/Normal colon  . COLONOSCOPY  12/16/02   small hemorrhoids  . COLONOSCOPY  06/20/2012   Procedure: COLONOSCOPY;  Surgeon: [name] Dolin, MD;  [facility];  Service: Endoscopy;  Laterality: N/A;  10:45  . defibrillator implanted 2006    . DOPPLER ECHOCARDIOGRAPHY  2012  . DOPPLER ECHOCARDIOGRAPHY  05,06,07,08,09,2011  . ESOPHAGOGASTRODUODENOSCOPY  06/12/2007   Rourk- normal esophagus, small hiatal hernia, otherwise normal stomach, D1, D2  . EYE SURGERY Right 2005  . EYE SURGERY Left 2006  . ICD LEAD REMOVAL Left 02/08/2015   Procedure: ICD LEAD REMOVAL;  Surgeon: [name] Lance, MD;  Location: [facility] OR;  Service:  Cardiovascular;  Laterality: Left;  "Will plan extraction and insertion of a BiV PM"  **Dr. Roxan [name] backing up case**  . IMPLANTABLE CARDIOVERTER DEFIBRILLATOR (ICD) GENERATOR CHANGE Left 02/08/2015   Procedure: ICD GENERATOR CHANGE;  Surgeon: [name] Lance, MD;  Location: [facility];  Service: Cardiovascular;  Laterality: Left;  . INSERT / REPLACE / REMOVE PACEMAKER    . PACEMAKER INSERTION  May 2016  . pacemaker placed  2004  . right breast cyst removed     benign   . right cataract removed     2005  . TEE WITHOUT CARDIOVERSION N/A 11/13/2016   Procedure: TRANSESOPHAGEAL ECHOCARDIOGRAM (TEE);  Surgeon: [name] Klein, MD;  Location: [facility];  Service: Cardiovascular;  Laterality: N/A;  .  THYROIDECTOMY      Social History   Socioeconomic History  . Marital status: Widowed    Spouse name: Not on file  . Number of children: 0  . Years of education: Not on file  . Highest education level: Not on file  Occupational History  . Occupation: retired    Fish farm manager: RETIRED  Tobacco Use  . Smoking status: Former Smoker    Packs/day: 0.75    Years: 40.00    Pack years: 30.00    Types: Cigarettes    Quit date: 03/08/1987    Years since quitting: 33.9  . Smokeless tobacco: Never Used  Vaping Use  . Vaping Use: Never used  Substance and Sexual Activity  . Alcohol use: No    Alcohol/week: 0.0 standard drinks  . Drug use: No  . Sexual activity: Not Currently  Other Topics Concern  . Not on file  Social History Narrative   From [place] (2004)   Social Determinants of Health   Financial Resource Strain: Low Risk   . Difficulty of Paying Living Expenses: Not hard at all  Food Insecurity: No Food Insecurity  . Worried About Charity fundraiser in the Last Year: Never true  . Ran Out of Food in the Last Year: Never true  Transportation Needs: No Transportation Needs  . Lack of Transportation (Medical): No  . Lack of Transportation (Non-Medical): No  Physical Activity:  Inactive  . Days of Exercise per Week: 0 days  . Minutes of Exercise per Session: 0 min  Stress: No Stress Concern Present  . Feeling of Stress : Only a little  Social Connections: Moderately Isolated  . Frequency of Communication with Friends and Family: More than three times a week  . Frequency of Social Gatherings with Friends and Family: Twice a week  . Attends Religious Services: More than 4 times per year  . Active Member of Clubs or Organizations: No  . Attends Archivist Meetings: Never  . Marital Status: Widowed  Intimate Partner Violence: Not on file   Family History  Problem Relation Age of Onset  . Pancreatic cancer Mother   . Heart disease Father   . Heart disease Sister   . Heart attack Sister   . Heart disease Brother   . Diabetes Brother   . Heart disease Brother   . Diabetes Brother   . Hypertension Brother   . Lung cancer Brother       VITAL SIGNS BP (!) 115/50   Pulse 62   Temp 98.3 F (36.8 C)   Resp 20   Ht 5\' 7"  (1.702 m)   Wt 191 lb 14.4 oz (87 kg)   SpO2 96%   BMI 30.06 kg/m   Outpatient Encounter Medications as of 02/04/2021  Medication Sig  . acetaminophen (TYLENOL) 500 MG tablet Take 500 mg by mouth every 6 (six) hours as needed.  Marland Kitchen albuterol (PROVENTIL) (2.5 MG/3ML) 0.083% nebulizer solution Take 2.5 mg by nebulization in the morning and at bedtime.  Marland Kitchen albuterol (VENTOLIN HFA) 108 (90 Base) MCG/ACT inhaler Inhale 1 puff into the lungs every 6 (six) hours as needed for wheezing or shortness of breath.  . allopurinol (ZYLOPRIM) 100 MG tablet TAKE ONE TABLET BY MOUTH ONCE DAILY.  . benzonatate (TESSALON) 100 MG capsule Take 100 mg by mouth 3 (three) times daily as needed for cough.  . cholecalciferol (VITAMIN D3) 25 MCG (1000 UNIT) tablet Take 1 tablet (1,000 Units total) by mouth in the morning.  Marland Kitchen  fluticasone (FLONASE) 50 MCG/ACT nasal spray Place 2 sprays into both nostrils daily.  . fluticasone-salmeterol (ADVAIR) 250-50 MCG/ACT  AEPB Inhale 1 puff into the lungs in the morning and at bedtime.  . folic acid (FOLVITE) 1 MG tablet TAKE ONE TABLET BY MOUTH ONCE DAILY.  . furosemide (LASIX) 40 MG tablet Take 1 tablet (40 mg total) by mouth daily. Advised to take 40 mg Lasix once a day alternating with 40 mg Lasix twice daily.  Marland Kitchen gabapentin (NEURONTIN) 300 MG capsule TAKE (1) CAPSULE BY MOUTH TWICE DAILY  . hydrALAZINE (APRESOLINE) 10 MG tablet Take 1 tablet (10 mg total) by mouth every 8 (eight) hours.  . isosorbide mononitrate (IMDUR) 30 MG 24 hr tablet Take 0.5 tablets (15 mg total) by mouth daily.  Marland Kitchen levETIRAcetam (KEPPRA) 500 MG tablet TAKE (1) TABLET BY MOUTH TWICE DAILY AT 9AM AND 9PM  . metoprolol succinate (TOPROL-XL) 50 MG 24 hr tablet Take 1 tablet (50 mg total) by mouth daily. Take with or immediately following a meal.  . nitroGLYCERIN (NITROSTAT) 0.4 MG SL tablet DISSOLVE 1 TABLET SUBLINGUALLY AS NEEDED FOR CHEST PAIN, MAY REPEAT EVERY 5 MINUTES. AFTER 3 CALL 911. (Patient taking differently: Place 0.4 mg under the tongue every 5 (five) minutes as needed for chest pain.)  . NON FORMULARY Diet :mechanical soft diet with ground meats, NAS, cons CHO  . ondansetron (ZOFRAN-ODT) 4 MG disintegrating tablet TAKE (1) TABLET BY MOUTH EVERY EIGHT HOURS AS NEEDED FOR NAUSEA OR VOMITING (Patient taking differently: Take 4 mg by mouth every 8 (eight) hours as needed for nausea or vomiting.)  . OXYGEN Inhale 3 L into the lungs continuous.  . polyethylene glycol (MIRALAX / GLYCOLAX) 17 g packet Take 17 g by mouth daily as needed. (Patient taking differently: Take 17 g by mouth daily as needed for mild constipation.)  . potassium chloride SA (KLOR-CON) 20 MEQ tablet TAKE ONE TABLET BY MOUTH ONCE DAILY.  . pravastatin (PRAVACHOL) 40 MG tablet TAKE ONE TABLET BY MOUTH ONCE DAILY.  . SYNTHROID 88 MCG tablet TAKE 1 TABLET BEFORE BREAKFAST.  Marland Kitchen UNABLE TO FIND Compression Stockings R-  Ankle  26.5cm   Calf 38cm  Leg  44cm L-  Ankle  27.5  cm   Calf 39cm  Leg  44cm (Patient taking differently: Compression Stockings R-  Ankle  26.5cm   Calf 38cm  Leg  44cm L-  Ankle  27.5 cm   Calf 39cm  Leg  44cm)  . warfarin (COUMADIN) 10 MG tablet TAKE (1) TABLET BY MOUTH DAILY AT 4PM (Patient taking differently: Take 10 mg by mouth daily at 6 (six) AM.)  . BREO ELLIPTA 100-25 MCG/INH AEPB INHALE (1) PUFF BY MOUTH INTO THE LUNGS DAILY  . [DISCONTINUED] Liniments (SALONPAS PAIN RELIEF PATCH EX) Apply 1 patch topically daily as needed (pain).  . [DISCONTINUED] metoprolol tartrate (LOPRESSOR) 50 MG tablet TAKE (1) TABLET BY MOUTH TWICE DAILY AT 9AM AND 9PM.  . [DISCONTINUED] warfarin (COUMADIN) 5 MG tablet Take 5 mg by mouth one time only at 4 PM.   No facility-administered encounter medications on file as of 02/04/2021.     SIGNIFICANT DIAGNOSTIC EXAMS   TODAY  01-18-21: chest x-ray: Cardiomegaly with mild central vascular congestion. No focal consolidation  01-18-21: renal ultrasound:  1. Mild right hydronephrosis. 2. Right renal parenchymal thinning and increased echogenicity consistent with chronic medical renal disease. 3. Significantly limited evaluation of the left kidney, no gross hydronephrosis  01-21-21: 2-d echo:  Left ventricular ejection  fraction, by estimation, is 25 to 30%. The  left ventricle has severely decreased function. The left ventricle  demonstrates global hypokinesis. The left ventricular internal cavity size  was moderately dilated. There is mild  concentric left ventricular hypertrophy. Left ventricular diastolic  parameters are indeterminate.   01-27-21 left elbow x-ray:  Soft tissue swelling. No fracture or dislocation. No appreciable arthropathy. Atherosclerotic arterial vascular calcifications noted.   TODAY  01-18-21: wbc 5.1; hgb 10.5; hct 35.9; mcv 93.0 plt 166; glucose 104; bun 87; creat 2.84; k+ 4.6; na++ 139; ca 9.5 GFR 16; phos 4.6 albumin 3.6; INR 1.6 01-21-21: wbc 7.5; hgb 8.5; hct 27.9; mcv 89.7 plt  157; glucose 105; bun 91; creat 1.93; k+ 4.4; na++ 139; ca 8.9; GFR 25;  01-28-21: wbc 7.5; hgb 8.0; hct 25.8; mcv 90.5 plt 169; glucose 102; bun 89; creat 1.72; k+ 4.5; na++ 136; ca 8.6 GFR 29; mag 2.0; phos 4.0 01-31-21; INR 1.2 (goal 2.5-3.5)   Review of Systems  Constitutional: Positive for malaise/fatigue.  Respiratory: Positive for shortness of breath. Negative for cough.   Cardiovascular: Negative for chest pain, palpitations and leg swelling.  Gastrointestinal: Negative for abdominal pain, constipation and heartburn.  Musculoskeletal: Negative for back pain, joint pain and myalgias.       Has chronic left arm edema   Skin: Negative.   Neurological: Negative for dizziness.  Psychiatric/Behavioral: The patient is not nervous/anxious.     Physical Exam Constitutional:      General: She is not in acute distress.    Appearance: She is well-developed. She is obese. She is not diaphoretic.  Neck:     Thyroid: No thyromegaly.  Cardiovascular:     Rate and Rhythm: Normal rate and regular rhythm.     Heart sounds: Normal heart sounds.     Comments: Psychologist, forensic ICD  MVP Pulmonary:     Effort: Pulmonary effort is normal. No respiratory distress.     Breath sounds: Wheezing present.     Comments: 02 dependent  Abdominal:     General: Bowel sounds are normal. There is no distension.     Palpations: Abdomen is soft.     Tenderness: There is no abdominal tenderness.  Musculoskeletal:        General: Normal range of motion.     Cervical back: Neck supple.     Right lower leg: No edema.     Left lower leg: No edema.  Lymphadenopathy:     Cervical: No cervical adenopathy.  Skin:    General: Skin is warm and dry.     Comments: Has bruising present left upper extremity  Has left upper extremity swelling present.   Neurological:     Mental Status: She is alert and oriented to person, place, and time.  Psychiatric:        Mood and Affect: Mood normal.      ASSESSMENT/  PLAN:  TODAY  1. [place] on chronic systolic and diastolic congestive heart failure 2. Persistent atrial fibrillation 3. S/p MVR   For INR 2.7 will continue coumadin 10 mg daily will repeat INR on 02-10-21 Will give an extra lasix 40 mg daily  She is due for lab work on 02-07-21 Will continue to monitor her status.    [name] Edwards NP [facility]  Contact [phone] Monday through Friday 8am- 5pm  After hours call [phone]

## 2021-02-05 IMAGING — DX DG CHEST 1V PORT
1 series · 2 of 2 positions shown · non-contrast
Comparison: January 30, 2020

CLINICAL DATA: Shortness of breath and CHF

EXAM:
PORTABLE CHEST 1 VIEW

[Series 1: chest ap · 0.14mm/px · 2 of 2 slices shown]
[im 1/2]
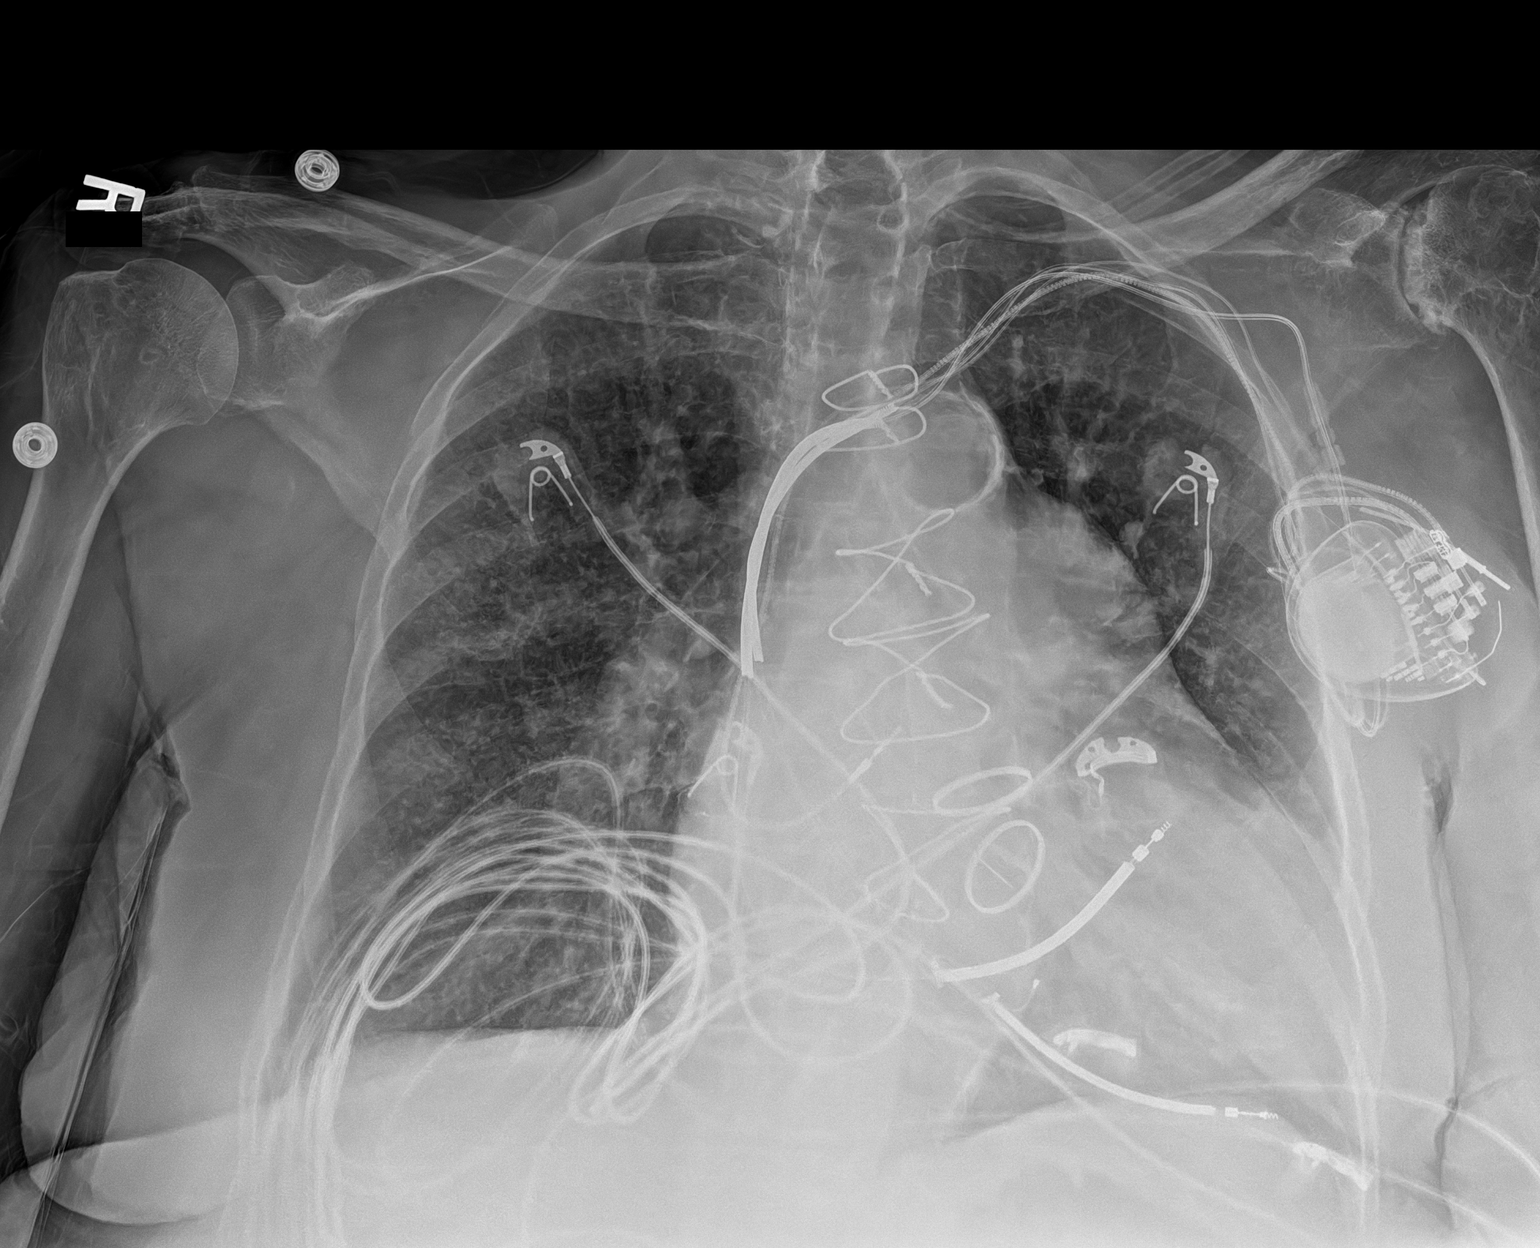
[im 2/2]
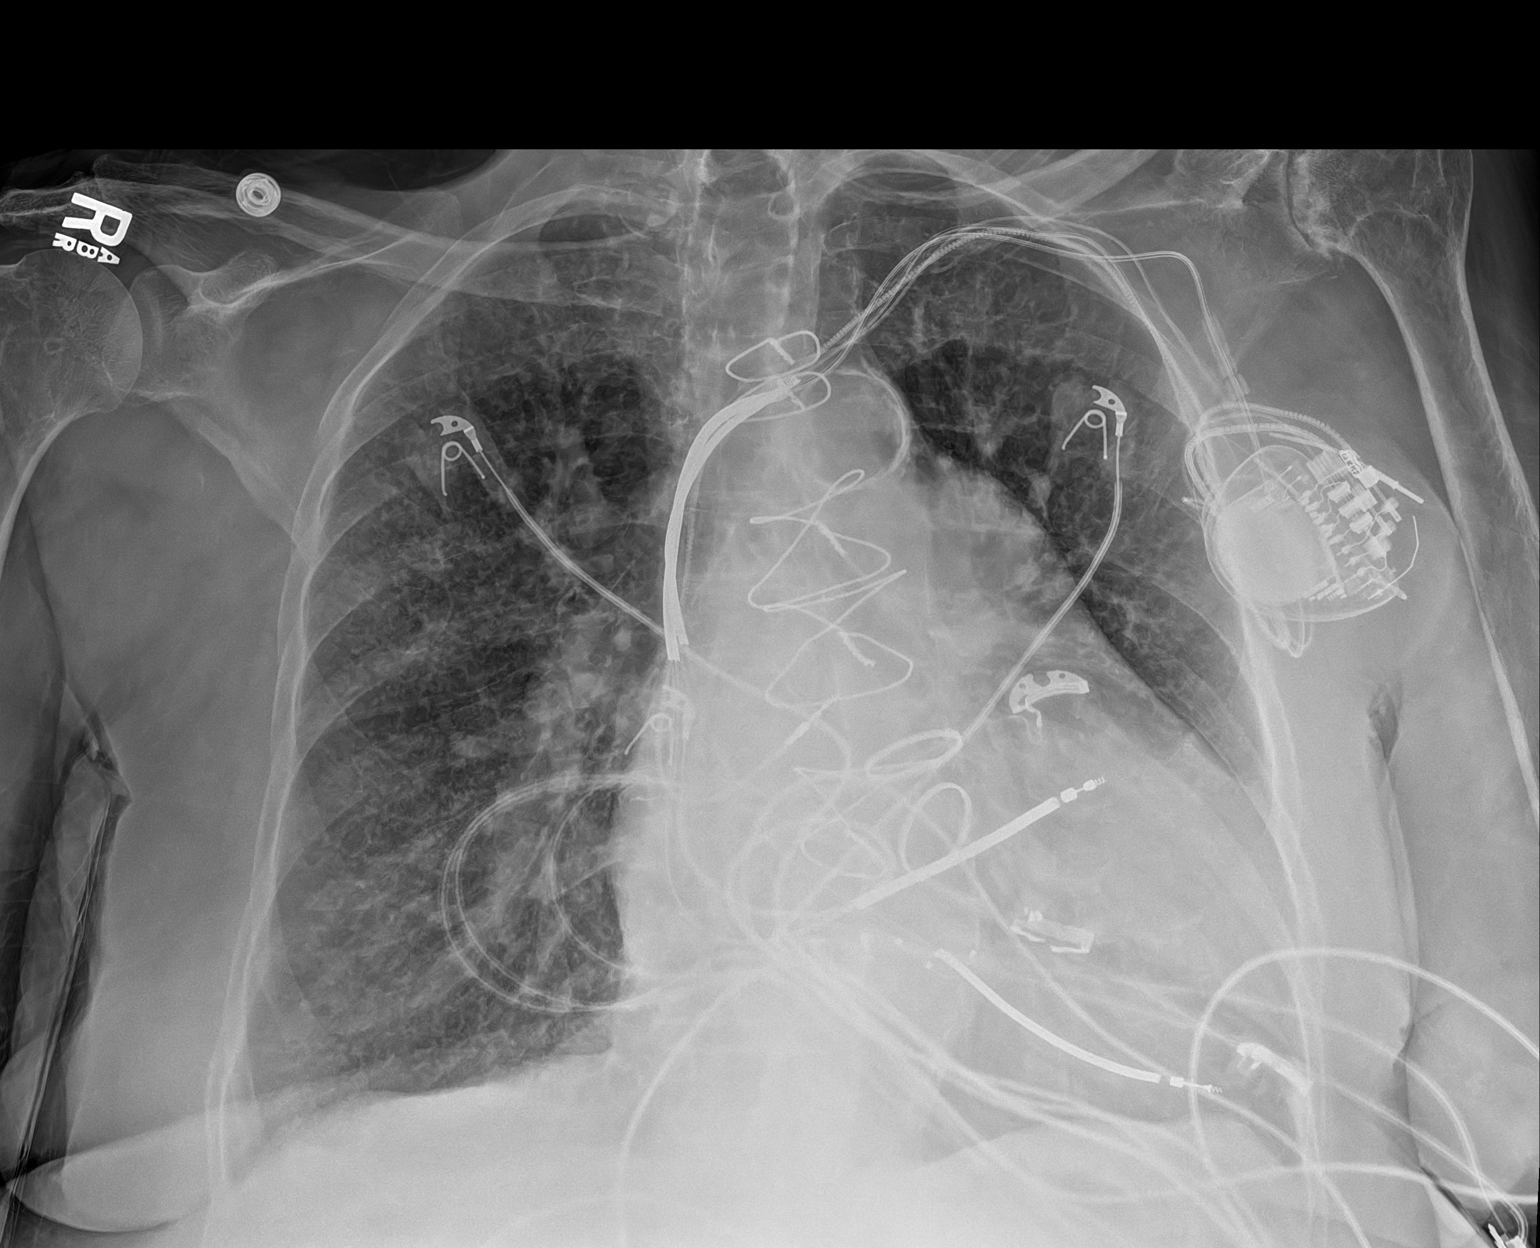

[2 of 2 positions shown; findings below may reference images not displayed]

FINDINGS: Again noted is stable cardiomegaly. Aortic knob calcifications are
seen. Overlying median sternotomy wires and prosthetic valves are
noted. A left-sided pacemaker noted with the lead tips in the right
atrium and right ventricle. Mildly increased interstitial markings
are again noted throughout both lungs, likely chronic lung changes.
No new airspace consolidation or pleural effusion. Advanced left
shoulder osteoarthritis is seen with cystic/erosive type change at
the humeral head.
IMPRESSION: No active disease.  Unchanged cardiomegaly

## 2021-02-07 ENCOUNTER — Other Ambulatory Visit (HOSPITAL_COMMUNITY)
Admission: RE | Admit: 2021-02-07 | Discharge: 2021-02-07 | Disposition: A | Discharge status: Home / Self Care | Payer: Medicare Other | Source: Skilled Nursing Facility | Attending: Adult Health | Admitting: Adult Health

## 2021-02-07 ENCOUNTER — Encounter: Payer: Self-pay | Admitting: Adult Health

## 2021-02-07 ENCOUNTER — Non-Acute Institutional Stay (SKILLED_NURSING_FACILITY): Payer: Medicare Other | Admitting: Adult Health

## 2021-02-07 DIAGNOSIS — E440 Moderate protein-calorie malnutrition: Secondary | ICD-10-CM

## 2021-02-07 DIAGNOSIS — Z6830 Body mass index (BMI) 30.0-30.9, adult: Secondary | ICD-10-CM | POA: Insufficient documentation

## 2021-02-07 LAB — COMPREHENSIVE METABOLIC PANEL
ALT: 20 U/L (ref 0–44)
AST: 29 U/L (ref 15–41)
Albumin: 3 g/dL — ABNORMAL LOW (ref 3.5–5.0)
Alkaline Phosphatase: 52 U/L (ref 38–126)
Anion gap: 6 (ref 5–15)
BUN: 65 mg/dL — ABNORMAL HIGH (ref 8–23)
CO2: 32 mmol/L (ref 22–32)
Calcium: 8.7 mg/dL — ABNORMAL LOW (ref 8.9–10.3)
Chloride: 98 mmol/L (ref 98–111)
Creatinine, Ser: 1.58 mg/dL — ABNORMAL HIGH (ref 0.44–1.00)
GFR, Estimated: 32 mL/min — ABNORMAL LOW (ref >60)
Glucose, Bld: 86 mg/dL (ref 70–99)
Potassium: 4.6 mmol/L (ref 3.5–5.1)
Sodium: 136 mmol/L (ref 135–145)
Total Bilirubin: 0.6 mg/dL (ref 0.3–1.2)
Total Protein: 6.4 g/dL — ABNORMAL LOW (ref 6.5–8.1)

## 2021-02-07 LAB — CBC
HCT: 25.8 % — ABNORMAL LOW (ref 36.0–46.0)
Hemoglobin: 7.7 g/dL — ABNORMAL LOW (ref 12.0–15.0)
MCH: 27.6 pg (ref 26.0–34.0)
MCHC: 29.8 g/dL — ABNORMAL LOW (ref 30.0–36.0)
MCV: 92.5 fL (ref 80.0–100.0)
Platelets: 197 10*3/uL (ref 150–400)
RBC: 2.79 MIL/uL — ABNORMAL LOW (ref 3.87–5.11)
RDW: 18.9 % — ABNORMAL HIGH (ref 11.5–15.5)
WBC: 4.7 10*3/uL (ref 4.0–10.5)
nRBC: 0 % (ref 0.0–0.2)

## 2021-02-07 NOTE — Progress Notes (Signed)
Location:  [place] Room Number: 159-P Place of Service:  SNF (31)   CODE STATUS: Full  Allergies  Allergen Reactions  . Penicillins Other (See Comments)    Bruise    . Aspirin Other (See Comments)    On coumadin  . Fentanyl Dermatitis    Rash with patch   . Niacin Itching    Chief Complaint  Patient presents with  . Acute Visit    Lab follow-up    HPI:  [name] [name] has had routine labs performed with an albumin of 3.0. there are no reports of loss appetite; no reports of weight loss. [name] [name] had recently been treated for acute on chronic heart failure. [name] [name] is on long term coumadin therapy. [name] [name] continues to participate in therapy.   Past Medical History:  Diagnosis Date  . Acute on chronic diastolic CHF (congestive heart failure) ([place]) 05/19/2012  . Acute on chronic respiratory failure with hypoxia ([place]) 01/31/2020  . Adenomatous polyp of colon 12/16/2002   Dr. [name]/[facility]  . Allergy   . Calculus of gallbladder with chronic cholecystitis without obstruction 02/09/2009   Qualifier: Diagnosis of  By: [name], [name]    . Cataract   . CHB (complete heart block) ([place]) 10/07/2014      . CHF (congestive heart failure) ([place])    a.  EF previously reduced to 25-30% b. EF improved to 60-65% by echo in 10/2017.   Marland Kitchen Chronic gout due to renal impairment    Per [facility] Records [facility]   . Chronic kidney disease, stage I 2006  . Cognitive communication deficit    Per [facility]   . Constipation       . Convulsion ([place])    Per [facility]   . COPD (chronic obstructive pulmonary disease) ([place])       . DJD (degenerative joint disease) of lumbar spine   . DM (diabetes mellitus) ([place]) 2006  . Dysphagia, unspecified    Per [facility] Records [facility]    . Esophageal reflux   . Glaucoma   . Gout       . H/O adenomatous polyp of colon 05/24/2012   2004 in [place] colonoscopy 2008 normal    . H/O heart valve replacement with mechanical valve    a. s/p mechanical AVR in 2001 and mechanical MVR in 2004.  Marland Kitchen H/O wisdom tooth extraction   . HIP PAIN, RIGHT 04/23/2009   Qualifier: Diagnosis of  By: [name], [name]    . Hyperkalemia    Per [facility] Records [facility]   . Hyperlipemia   . Hypertensive heart and kidney disease with HF and with CKD stage I-IV ([place])    Per [facility] Records [facility]   . IDA (iron deficiency anemia)    Parenteral iron/[name]  . Insomnia       . Localized edema    Left arm, Per [facility] Records [facility]   . Long term (current) use of anticoagulants    Per [facility]   . Nausea without vomiting 06/07/2010   Qualifier: Diagnosis of  By: [name], [name]    . Non-ischemic cardiomyopathy ([place])    a. s/p MDT CRTD  . Obesity, unspecified   .  Oxygen deficiency 2014   nocturnal;  . Papilloma of breast 03/06/2012   This was diagnosed as a left breast papilloma by ultrasound characteristics and symptoms in 2010. Because of possible medical risk factors no surgical intervention has been done and it did doing annual followups. The area has been stable by physical examination and mammograms and ultrasound since 2010.   . Presence of cardiac pacemaker    Per [facility] Records [facility]   . Spinal stenosis of lumbar region 02/19/2013  . Spondylolisthesis   . Spondylosis   . Thyroid cancer ([place])    remote thyroidectomy, no recurrence, pt denies in 2016 thyroid cancer  . Type 2 diabetes mellitus with hyperglycemia ([place]) 07/15/2010  . Type 2 diabetes mellitus with hypoglycemia without coma ([place])    Per  [facility] Records [facility]   . Unspecified chronic bronchitis ([place])    Per [facility]   . Unspecified hypothyroidism       . Urinary tract infection, site not specified    Per [facility] Records [facility]   . 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 Vertigo         Past Surgical History:  Procedure Laterality Date  . AORTIC AND MITRAL VALVE REPLACEMENT  2001  . BI-VENTRICULAR IMPLANTABLE CARDIOVERTER DEFIBRILLATOR UPGRADE N/A 01/18/2015   Procedure: BI-VENTRICULAR IMPLANTABLE CARDIOVERTER DEFIBRILLATOR UPGRADE;  Surgeon: Evans Lance, MD;  Location: [facility] CATH LAB;  Service: Cardiovascular;  Laterality: N/A;  . BIV ICD GENERATOR CHANGEOUT N/A 01/30/2019   Procedure: BIV ICD GENERATOR CHANGEOUT;  Surgeon: Evans Lance, MD;  Location: [place] CV LAB;  Service: Cardiovascular;  Laterality: N/A;  . CARDIAC CATHETERIZATION    . CARDIAC VALVE REPLACEMENT    . CARDIOVERSION N/A 11/13/2016   Procedure: CARDIOVERSION;  Surgeon: Sanda Klein, MD;  Location: [facility] ENDOSCOPY;  Service: Cardiovascular;  Laterality: N/A;  . COLONOSCOPY  06/12/2007   [place]- Normal rectum/Normal colon  . COLONOSCOPY  12/16/02   small hemorrhoids  . COLONOSCOPY  06/20/2012   Procedure: COLONOSCOPY;  Surgeon: Daneil Dolin, MD;  Location: [facility] ENDO SUITE;  Service: Endoscopy;  Laterality: N/A;  10:45  . defibrillator implanted 2006    . DOPPLER ECHOCARDIOGRAPHY  2012  . DOPPLER ECHOCARDIOGRAPHY  05,06,07,08,09,2011  . ESOPHAGOGASTRODUODENOSCOPY  06/12/2007   [place]ourk- normal esophagus, small hiatal hernia, otherwise normal stomach, D1, D2  . EYE SURGERY Right 2005  . EYE SURGERY Left 2006  . ICD LEAD REMOVAL Left 02/08/2015   Procedure: ICD LEAD REMOVAL;  Surgeon: Evans Lance, MD;  Location: [facility] OR;  Service: Cardiovascular;  Laterality: Left;  "Will plan extraction and  insertion of a BiV PM"  **Dr. Roxan Hockey backing up case**  . IMPLANTABLE CARDIOVERTER DEFIBRILLATOR (ICD) GENERATOR CHANGE Left 02/08/2015   Procedure: ICD GENERATOR CHANGE;  Surgeon: Evans Lance, MD;  Location: [place];  Service: Cardiovascular;  Laterality: Left;  . INSERT / REPLACE / REMOVE PACEMAKER    . PACEMAKER INSERTION  May 2016  . pacemaker placed  2004  . right breast cyst removed     benign   . right cataract removed     2005  . TEE WITHOUT CARDIOVERSION N/A 11/13/2016   Procedure: TRANSESOPHAGEAL ECHOCARDIOGRAM (TEE);  Surgeon: Sanda Klein, MD;  Location: [facility] ENDOSCOPY;  Service: Cardiovascular;  Laterality: N/A;  . THYROIDECTOMY      Social History  Socioeconomic History  . Marital status: Widowed    Spouse name: Not on file  . Number of children: 0  . Years of education: Not on file  . Highest education level: Not on file  Occupational History  . Occupation: retired    Fish farm manager: RETIRED  Tobacco Use  . Smoking status: Former Smoker    Packs/day: 0.75    Years: 40.00    Pack years: 30.00    Types: Cigarettes    Quit date: 03/08/1987    Years since quitting: 33.9  . Smokeless tobacco: Never Used  Vaping Use  . Vaping Use: Never used  Substance and Sexual Activity  . Alcohol use: No    Alcohol/week: 0.0 standard drinks  . Drug use: No  . Sexual activity: Not Currently  Other Topics Concern  . Not on file  Social History Narrative   From [place] (2004)   Social Determinants of Health   Financial Resource Strain: Low Risk   . Difficulty of Paying Living Expenses: Not hard at all  Food Insecurity: No Food Insecurity  . Worried About Charity fundraiser in the Last Year: Never true  . Ran Out of Food in the Last Year: Never true  Transportation Needs: No Transportation Needs  . Lack of Transportation (Medical): No  . Lack of Transportation (Non-Medical): No  Physical Activity: Inactive  . Days of Exercise per Week: 0 days  . Minutes of  Exercise per Session: 0 min  Stress: No Stress Concern Present  . Feeling of Stress : Only a little  Social Connections: Moderately Isolated  . Frequency of Communication with Friends and Family: More than three times a week  . Frequency of Social Gatherings with Friends and Family: Twice a week  . Attends Religious Services: More than 4 times per year  . Active Member of Clubs or Organizations: No  . Attends Archivist Meetings: Never  . Marital Status: Widowed  Intimate Partner Violence: Not on file   Family History  Problem Relation Age of Onset  . Pancreatic cancer Mother   . Heart disease Father   . Heart disease Sister   . Heart attack Sister   . Heart disease Brother   . Diabetes Brother   . Heart disease Brother   . Diabetes Brother   . Hypertension Brother   . Lung cancer Brother       VITAL SIGNS BP (!) 142/70   Pulse 70   Temp 98 F (36.7 C)   Resp 20   Ht 5\' 7"  (1.702 m)   Wt 191 lb 14.4 oz (87 kg)   SpO2 96%   BMI 30.06 kg/m   Outpatient Encounter Medications as of 02/07/2021  Medication Sig  . acetaminophen (TYLENOL) 500 MG tablet Take 500 mg by mouth every 6 (six) hours as needed.  Marland Kitchen albuterol (PROVENTIL) (2.5 MG/3ML) 0.083% nebulizer solution Take 2.5 mg by nebulization in the morning and at bedtime.  Marland Kitchen albuterol (VENTOLIN HFA) 108 (90 Base) MCG/ACT inhaler Inhale 1 puff into the lungs every 6 (six) hours as needed for wheezing or shortness of breath.  . allopurinol (ZYLOPRIM) 100 MG tablet TAKE ONE TABLET BY MOUTH ONCE DAILY.  . benzonatate (TESSALON) 100 MG capsule Take 100 mg by mouth 3 (three) times daily as needed for cough.  . cholecalciferol (VITAMIN D3) 25 MCG (1000 UNIT) tablet Take 1 tablet (1,000 Units total) by mouth in the morning.  . fluticasone (FLONASE) 50 MCG/ACT nasal spray Place 2  sprays into both nostrils daily.  . fluticasone-salmeterol (ADVAIR) 250-50 MCG/ACT AEPB Inhale 1 puff into the lungs in the morning and at  bedtime.  . folic acid (FOLVITE) 1 MG tablet TAKE ONE TABLET BY MOUTH ONCE DAILY.  . furosemide (LASIX) 40 MG tablet Take 1 tablet (40 mg total) by mouth daily. Advised to take 40 mg Lasix once a day alternating with 40 mg Lasix twice daily.  Marland Kitchen gabapentin (NEURONTIN) 300 MG capsule TAKE (1) CAPSULE BY MOUTH TWICE DAILY  . hydrALAZINE (APRESOLINE) 10 MG tablet Take 1 tablet (10 mg total) by mouth every 8 (eight) hours.  . isosorbide mononitrate (IMDUR) 30 MG 24 hr tablet Take 0.5 tablets (15 mg total) by mouth daily.  Marland Kitchen levETIRAcetam (KEPPRA) 500 MG tablet TAKE (1) TABLET BY MOUTH TWICE DAILY AT 9AM AND 9PM  . metoprolol succinate (TOPROL-XL) 50 MG 24 hr tablet Take 1 tablet (50 mg total) by mouth daily. Take with or immediately following a meal.  . nitroGLYCERIN (NITROSTAT) 0.4 MG SL tablet DISSOLVE 1 TABLET SUBLINGUALLY AS NEEDED FOR CHEST PAIN, MAY REPEAT EVERY 5 MINUTES. AFTER 3 CALL 911. (Patient taking differently: Place 0.4 mg under the tongue every 5 (five) minutes as needed for chest pain.)  . NON FORMULARY Diet :mechanical soft diet with ground meats, NAS, cons CHO  . ondansetron (ZOFRAN-ODT) 4 MG disintegrating tablet TAKE (1) TABLET BY MOUTH EVERY EIGHT HOURS AS NEEDED FOR NAUSEA OR VOMITING (Patient taking differently: Take 4 mg by mouth every 8 (eight) hours as needed for nausea or vomiting.)  . OXYGEN Inhale 3 L into the lungs continuous.  . polyethylene glycol (MIRALAX / GLYCOLAX) 17 g packet Take 17 g by mouth daily as needed. (Patient taking differently: Take 17 g by mouth daily as needed for mild constipation.)  . potassium chloride SA (KLOR-CON) 20 MEQ tablet TAKE ONE TABLET BY MOUTH ONCE DAILY.  . pravastatin (PRAVACHOL) 40 MG tablet TAKE ONE TABLET BY MOUTH ONCE DAILY.  . SYNTHROID 88 MCG tablet TAKE 1 TABLET BEFORE BREAKFAST.  Marland Kitchen UNABLE TO FIND Compression Stockings R-  Ankle  26.5cm   Calf 38cm  Leg  44cm L-  Ankle  27.5 cm   Calf 39cm  Leg  44cm (Patient taking differently:  Compression Stockings R-  Ankle  26.5cm   Calf 38cm  Leg  44cm L-  Ankle  27.5 cm   Calf 39cm  Leg  44cm)  . warfarin (COUMADIN) 10 MG tablet TAKE (1) TABLET BY MOUTH DAILY AT 4PM (Patient taking differently: Take 10 mg by mouth daily at 6 (six) AM.)  . [DISCONTINUED] BREO ELLIPTA 100-25 MCG/INH AEPB INHALE (1) PUFF BY MOUTH INTO THE LUNGS DAILY (Patient not taking: Reported on 02/07/2021)   No facility-administered encounter medications on file as of 02/07/2021.     SIGNIFICANT DIAGNOSTIC EXAMS   PREVIOUS   01-18-21: chest x-ray: Cardiomegaly with mild central vascular congestion. No focal consolidation  01-18-21: renal ultrasound:  1. Mild right hydronephrosis. 2. Right renal parenchymal thinning and increased echogenicity consistent with chronic medical renal disease. 3. Significantly limited evaluation of the left kidney, no gross hydronephrosis  01-21-21: 2-d echo:  Left ventricular ejection fraction, by estimation, is 25 to 30%. The  left ventricle has severely decreased function. The left ventricle  demonstrates global hypokinesis. The left ventricular internal cavity size  was moderately dilated. There is mild  concentric left ventricular hypertrophy. Left ventricular diastolic  parameters are indeterminate.   01-27-21 left elbow x-ray:  Soft tissue  swelling. No fracture or dislocation. No appreciable arthropathy. Atherosclerotic arterial vascular calcifications noted.  NO NEW EXAMS.    LABS REVIEWED PREVIOUS   01-18-21: wbc 5.1; hgb 10.5; hct 35.9; mcv 93.0 plt 166; glucose 104; bun 87; creat 2.84; k+ 4.6; na++ 139; ca 9.5 GFR 16; phos 4.6 albumin 3.6; INR 1.6 01-21-21: wbc 7.5; hgb 8.5; hct 27.9; mcv 89.7 plt 157; glucose 105; bun 91; creat 1.93; k+ 4.4; na++ 139; ca 8.9; GFR 25;  01-28-21: wbc 7.5; hgb 8.0; hct 25.8; mcv 90.5 plt 169; glucose 102; bun 89; creat 1.72; k+ 4.5; na++ 136; ca 8.6 GFR 29; mag 2.0; phos 4.0 01-31-21; INR 1.2 (goal 2.5-3.5)   TODAY;   02-07-21: glucose  86; bun 65; creat 1.58; k+ 4.6; na++ 136; ca 8.7 GFR 32; liver normal albumin 3.0   Review of Systems  Constitutional: Negative for malaise/fatigue.  Respiratory: Negative for cough and shortness of breath.   Cardiovascular: Negative for chest pain, palpitations and leg swelling.  Gastrointestinal: Negative for abdominal pain, constipation and heartburn.  Musculoskeletal: Negative for back pain, joint pain and myalgias.       Chronic left arm edema   Skin: Negative.   Neurological: Negative for dizziness.  Psychiatric/Behavioral: The patient is not nervous/anxious.    Physical Exam Constitutional:      General: [name] [name] is not in acute distress.    Appearance: [name] [name] is well-developed. [name] [name] is obese. [name] [name] is not diaphoretic.  Neck:     Thyroid: No thyromegaly.  Cardiovascular:     Rate and Rhythm: Normal rate and regular rhythm.     Heart sounds: Normal heart sounds.     Comments: Psychologist, forensic MVP Pulmonary:     Effort: Pulmonary effort is normal. No respiratory distress.     Breath sounds: Normal breath sounds.     Comments: 02 dependent  Abdominal:     General: Bowel sounds are normal. There is no distension.     Palpations: Abdomen is soft.     Tenderness: There is no abdominal tenderness.  Musculoskeletal:        General: Normal range of motion.     Cervical back: Neck supple.     Right lower leg: No edema.     Left lower leg: No edema.  Lymphadenopathy:     Cervical: No cervical adenopathy.  Skin:    General: Skin is warm and dry.     Comments: Has bruising present left upper extremity  Has left upper extremity swelling present.    Neurological:     Mental Status: [name] [name] is alert and oriented to person, place, and time.  Psychiatric:        Mood and Affect: Mood normal.       ASSESSMENT/ PLAN:  TODAY  1. Protein calorie malnutrition moderate  Will begin prostat twice daily in between meals Will monitor    Ok Edwards NP [facility]  Contact  [phone] Monday through Friday 8am- 5pm  After hours call [phone]

## 2021-02-08 DIAGNOSIS — E440 Moderate protein-calorie malnutrition: Secondary | ICD-10-CM | POA: Insufficient documentation

## 2021-02-09 NOTE — Progress Notes (Signed)
Cardiology Office Note   Date:  02/11/2021   ID:  [name] [name], DOB [birth year], MRN [id number]  PCP:  Fayrene Helper, MD  Cardiologist:  Dr.Croitoru  CC: Hospital Follow Up   History of Present Illness: [name] [name] is a 87 y.o. female who presents for ongoing assessment and management of persistent atrial fibrillation, complete heart block status post [employer] BiV pacemaker on 01/30/2019, oxygen dependent COPD on 2 L per nasal cannula at home, hyperlipidemia, with other history to include diabetes type 2, hypothyroidism, chronic combined systolic and diastolic heart failure, aortic and mitral valve replacement with mechanical valve.    Was seen on consultation on recent hospitalization on 01/21/2021 by cardiology  in the setting of acute on chronic combined CHF.  She was treated with IV diuresis with 12.2 L removed.  Echocardiogram revealed an EF of 25% to 30%. Given lack of chest pain and longstanding dilated cardiomyopathy, ischemia evaluation was not pursued.  She was not placed on ACE, ARB, ARNI, due to acute kidney injury with a creatinine of 2.8.  Discharge creatinine 1.72 on 01/28/2021.  She remained on Coumadin for persistent atrial fibrillation and mechanical aortic and mitral valve replacement. Most recent INR on 02/10/2021 3.6 with a goal of 2.5-3.5. This is being monitored by [facility].  She was sent to skilled nursing facility on hydralazine 10 mg 3 times daily, Imdur 30 mg daily Toprol 50 mg daily and Lasix 40 mg daily alternating with 40 mg twice daily. Last recorded weight of 197 lbs at SNF.  She is here today with her caretaker in a wheelchair, without any cardiac complaints.  Her weight is significantly different from that recorded at [facility] center, as her weight here is 184 pounds.  Her main complaint is that of a sore left arm from infiltration and bleeding during hospitalization from an IV site.  She has a large amount of ecchymosis in the brachial  area, left shoulder, and left axillary into the breast.  It is extremely sore and difficult for her to move her left arm.  She states that she is avoiding salt, will not eat any soup that is provided for her, and does not add salt to her meals.  She confirms that the skilled nursing facility is checking her PT/INR and dosing her Coumadin with her next INR check to be May 26.  She is working with rehab and has begun to walk but has significantly reduced strength.  She denies chest pain, PND or orthopnea, mild dyspnea with walking due to deconditioning.  Past Medical History:  Diagnosis Date  . Acute on chronic diastolic CHF (congestive heart failure) ([place]) 05/19/2012  . Acute on chronic respiratory failure with hypoxia ([place]) 01/31/2020  . Adenomatous polyp of colon 12/16/2002   Dr. [name]/[facility]  . Allergy   . Calculus of gallbladder with chronic cholecystitis without obstruction 02/09/2009   Qualifier: Diagnosis of  By: [name], [name]    . Cataract   . CHB (complete heart block) ([place]) 10/07/2014      . CHF (congestive heart failure) ([place])    a.  EF previously reduced to 25-30% b. EF improved to 60-65% by echo in 10/2017.   Marland Kitchen Chronic gout due to renal impairment    Per [facility] Records [facility]   . Chronic kidney disease, stage I 2006  . Cognitive communication deficit    Per [facility]   .  Constipation       . Convulsion ([place])    Per [facility]   . COPD (chronic obstructive pulmonary disease) ([place])       . DJD (degenerative joint disease) of lumbar spine   . DM (diabetes mellitus) ([place]) 2006  . Dysphagia, unspecified    Per [facility] Records [facility]   . Esophageal reflux   . Glaucoma   . Gout       . H/O adenomatous polyp of colon 05/24/2012   2004 in [place]  colonoscopy 2008 normal    . H/O heart valve replacement with mechanical valve    a. s/p mechanical AVR in 2001 and mechanical MVR in 2004.  Marland Kitchen H/O wisdom tooth extraction   . HIP PAIN, RIGHT 04/23/2009   Qualifier: Diagnosis of  By: [name], [name]    . Hyperkalemia    Per [facility]   . Hyperlipemia   . Hypertensive heart and kidney disease with HF and with CKD stage I-IV ([place])    Per [facility]   . IDA (iron deficiency anemia)    Parenteral iron/[name]  . Insomnia       . Localized edema    Left arm, Per [facility]   . Long term (current) use of anticoagulants    Per [facility]   . Nausea without vomiting 06/07/2010   Qualifier: Diagnosis of  By: [name], [name]    . Non-ischemic cardiomyopathy ([place])    a. s/p MDT CRTD  . Obesity, unspecified   . Oxygen deficiency 2014   nocturnal;  . Papilloma of breast 03/06/2012   This was diagnosed as a left breast papilloma by ultrasound characteristics and symptoms in 2010. Because of possible medical risk factors no surgical intervention has been done and it did doing annual followups. The area has been stable by physical examination and mammograms and ultrasound since 2010.   . Presence of cardiac pacemaker    Per [facility]   . Spinal stenosis of lumbar region 02/19/2013  . Spondylolisthesis   . Spondylosis   . Thyroid cancer ([place])    remote thyroidectomy, no recurrence, pt denies in 2016 thyroid cancer  . Type 2 diabetes mellitus with hyperglycemia ([place]) 07/15/2010  . Type 2 diabetes mellitus with hypoglycemia without coma ([place])    Per [facility]   . Unspecified chronic bronchitis ([place])    Per [facility]   . Unspecified hypothyroidism       . Urinary tract infection, site not specified    Per [facility]   . 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 Vertigo         Past Surgical History:  Procedure Laterality Date  . AORTIC AND MITRAL VALVE REPLACEMENT  2001  . BI-VENTRICULAR IMPLANTABLE CARDIOVERTER DEFIBRILLATOR UPGRADE N/A 01/18/2015   Procedure: BI-VENTRICULAR IMPLANTABLE CARDIOVERTER DEFIBRILLATOR UPGRADE;  Surgeon: Evans Lance, MD;  Location: [facility] CATH LAB;  Service: Cardiovascular;  Laterality: N/A;  . BIV ICD GENERATOR CHANGEOUT N/A 01/30/2019   Procedure: BIV ICD GENERATOR CHANGEOUT;  Surgeon: Evans Lance, MD;  Location: [facility] CV LAB;  Service: Cardiovascular;  Laterality: N/A;  . CARDIAC CATHETERIZATION    . CARDIAC VALVE REPLACEMENT    . CARDIOVERSION N/A 11/13/2016   Procedure: CARDIOVERSION;  Surgeon: Sanda Klein, MD;  Location: [facility] ENDOSCOPY;  Service: Cardiovascular;  Laterality: N/A;  . COLONOSCOPY  06/12/2007   Rourk- Normal rectum/Normal colon  . COLONOSCOPY  12/16/02   small hemorrhoids  . COLONOSCOPY  06/20/2012   Procedure: COLONOSCOPY;  Surgeon: Daneil Dolin, MD;  Location: [facility] ENDO [facility];  Service: Endoscopy;  Laterality: N/A;  10:45  . defibrillator implanted 2006    . DOPPLER ECHOCARDIOGRAPHY  2012  . DOPPLER ECHOCARDIOGRAPHY  05,06,07,08,09,2011  . ESOPHAGOGASTRODUODENOSCOPY  06/12/2007   Rourk- normal esophagus, small hiatal hernia, otherwise normal stomach, D1, D2  . EYE SURGERY Right 2005  . EYE SURGERY Left 2006  . ICD LEAD REMOVAL Left 02/08/2015   Procedure: ICD LEAD REMOVAL;  Surgeon: Evans Lance, MD;  [facility];  Service: Cardiovascular;  Laterality: Left;  "Will plan extraction and insertion of a BiV PM"  **Dr. Roxan Hockey backing up case**  . IMPLANTABLE CARDIOVERTER DEFIBRILLATOR (ICD) GENERATOR CHANGE  Left 02/08/2015   Procedure: ICD GENERATOR CHANGE;  Surgeon: Evans Lance, MD;  Location: [facility];  Service: Cardiovascular;  Laterality: Left;  . INSERT / REPLACE / REMOVE PACEMAKER    . PACEMAKER INSERTION  May 2016  . pacemaker placed  2004  . right breast cyst removed     benign   . right cataract removed     2005  . TEE WITHOUT CARDIOVERSION N/A 11/13/2016   Procedure: TRANSESOPHAGEAL ECHOCARDIOGRAM (TEE);  Surgeon: Sanda Klein, MD;  Location: [facility] ENDOSCOPY;  Service: Cardiovascular;  Laterality: N/A;  . THYROIDECTOMY       Current Outpatient Medications  Medication Sig Dispense Refill  . allopurinol (ZYLOPRIM) 100 MG tablet TAKE ONE TABLET BY MOUTH ONCE DAILY. 30 tablet 0  . folic acid (FOLVITE) 1 MG tablet TAKE ONE TABLET BY MOUTH ONCE DAILY. 30 tablet 0  . gabapentin (NEURONTIN) 300 MG capsule TAKE (1) CAPSULE BY MOUTH TWICE DAILY 60 capsule 0  . levETIRAcetam (KEPPRA) 500 MG tablet TAKE (1) TABLET BY MOUTH TWICE DAILY AT 9AM AND 9PM 60 tablet 0  . potassium chloride SA (KLOR-CON) 20 MEQ tablet TAKE ONE TABLET BY MOUTH ONCE DAILY. 30 tablet 0  . pravastatin (PRAVACHOL) 40 MG tablet TAKE ONE TABLET BY MOUTH ONCE DAILY. 30 tablet 0  . SYNTHROID 88 MCG tablet TAKE 1 TABLET BEFORE BREAKFAST. 30 tablet 0   No current facility-administered medications for this visit.    Allergies:   Penicillins, Aspirin, Fentanyl, and Niacin    Social History:  The patient  reports that she quit smoking about 33 years ago. Her smoking use included cigarettes. She has a 30.00 pack-year smoking history. She has never used smokeless tobacco. She reports that she does not drink alcohol and does not use drugs.   Family History:  The patient's family history includes Diabetes in her brother and brother; Heart attack in her sister; Heart disease in her brother, brother, father, and sister; Hypertension in her brother; Lung cancer in her brother; Pancreatic cancer in her mother.    ROS: All other  systems are reviewed and negative. Unless otherwise mentioned in H&P    PHYSICAL EXAM: VS:  BP 126/64   Pulse 61   Ht 5\' 7"  (1.702 m)   Wt 184 lb 12.8 oz (83.8 kg)   SpO2 92%   BMI 28.94 kg/m  ,  BMI Body mass index is 28.94 kg/m. GEN: Well nourished, well developed, in no acute distress HEENT: normal Neck: no JVD, carotid bruits, or masses Cardiac: RRR; with crisp mechanical click no murmurs, rubs, or gallops,no edema wearing support hose Respiratory:  Clear to auscultation bilaterally, normal work of breathing, wearing oxygen via nasal cannula mild bibasilar crackles without cough GI: soft, nontender, nondistended, + BS MS: no deformity or atrophy Skin: warm and dry, no rash, large area of ecchymosis on the left shoulder left axillary area and brachial area with difficult ROM. Neuro: Diminished strength and sensation are intact Psych: euthymic mood, full affect   EKG: Not completed this office visit.  Recent Labs: 02/16/2020: TSH 2.32 01/23/2021: B Natriuretic Peptide 519.3 01/28/2021: Magnesium 2.0 02/07/2021: ALT 20; BUN 65; Creatinine, Ser 1.58; Hemoglobin 7.7; Platelets 197; Potassium 4.6; Sodium 136    Lipid Panel    Component Value Date/Time   CHOL 124 01/05/2020 1253   CHOL 165 10/16/2016 1213   TRIG 73 01/05/2020 1253   HDL 44 (L) 01/05/2020 1253   HDL 31 (L) 10/16/2016 1213   CHOLHDL 2.8 01/05/2020 1253   VLDL 12 07/07/2019 1354   LDLCALC 65 01/05/2020 1253      Wt Readings from Last 3 Encounters:  02/11/21 184 lb 12.8 oz (83.8 kg)  02/10/21 197 lb 3.2 oz (89.4 kg)  02/07/21 191 lb 14.4 oz (87 kg)      Other studies Reviewed: 2D echo 12/2020 IMPRESSIONS  1. Left ventricular ejection fraction, by estimation, is 25 to 30%. The  left ventricle has severely decreased function. The left ventricle  demonstrates global hypokinesis. The left ventricular internal cavity size  was moderately dilated. There is mild  concentric left ventricular hypertrophy. Left  ventricular diastolic  parameters are indeterminate.  2. Right ventricular systolic function is normal. The right ventricular  size is normal. There is severely elevated pulmonary artery systolic  pressure.  3. Left atrial size was severely dilated.  4. Right atrial size was severely dilated.  5. A small pericardial effusion is present. The pericardial effusion is  posterior to the left ventricle.  6. Mitral valve gradient essentially unchanged form 01/2020. The mitral  valve has been repaired/replaced. No evidence of mitral valve  regurgitation. No evidence of mitral stenosis. The mean mitral valve  gradient is 6.0 mmHg. There is a mechanical valve  present in the mitral position. Procedure Date: 2004.  7. Tricuspid valve regurgitation is mild to moderate.  8. The aortic valve has been repaired/replaced. Aortic valve  regurgitation is not visualized. No aortic stenosis is present. There is a  mechanical valve present in the aortic position. Procedure Date: 2001.  Echo findings are consistent with normal  structure and function of the aortic valve prosthesis. Aortic valve area,  by VTI measures 1.32 cm. Aortic valve mean gradient measures 7.0 mmHg.  Aortic valve Vmax measures 2.00 m/s.  9. Aortic dilatation noted. There is mild dilatation of the ascending  aorta, measuring 38 mm.  10. The inferior vena cava is dilated in size with <50% respiratory  variability, suggesting right atrial pressure of 15 mmHg.   ASSESSMENT AND PLAN:  1.  Chronic combined CHF: She appears to be euvolemic at this time.  Her salt has been restricted.  She does not have any edema and is wearing support hose.  She is being followed with labs with most recent creatinine of 1.5.  She remains on Lasix 40 mg daily alternating with 40 mg twice daily which  appears to be keeping her fluid status stable.  No changes in her regimen at this time she should continue frequent weights and salt avoidance.  2.   Mechanical aortic and mitral valve: Remains on Coumadin.  Last INR record was 3.6 (goal of 2.5-3.5) with follow-up INR check next week.  There is not appear to be evidence of bleeding or excessive bruising with the exception of the ecchymosis noted on the right shoulder and axillary area from prior infiltration.  3.  Pacemaker in situ with cardiac defibrillator: Follow-up with remote checks per protocol most recent appointment is scheduled for Feb 22, 2021 for remote pacer check.  4.  Hypertension: Well-controlled currently on metoprolol, hydralazine isosorbide.  No changes or lab work today.  Most recent lab work on 02/07/2021.  5.  Chronic anemia: Most recent labs have a hemoglobin of 7.7 on 02/08/2020.  She is being seen by the cancer center with an appointment scheduled Feb 15, 2021 for injection.  They are following labs as well.  This is particularly important since she is on anticoagulation.  6.  Oxygen dependent COPD: Remains on oxygen via nasal cannula at 2 L.  No evidence of desaturation with an O2 sat of 92% recorded today in the office  Current medicines are reviewed at length with the patient today.  I have spent 35 minutes  dedicated to the care of this patient on the date of this encounter to include pre-visit review of records, assessment, management and diagnostic testing,with shared decision making.  Labs/ tests ordered today include:None  Phill Myron. West Pugh, ANP, AACC   02/11/2021 2:55 PM    [facility] [place] [address] Office [phone] Fax [phone]  Notice: This dictation was prepared with Dragon dictation along with smaller phrase technology. Any transcriptional errors that result from this process are unintentional and may not be corrected upon review.

## 2021-02-10 ENCOUNTER — Encounter: Payer: Self-pay | Admitting: Adult Health

## 2021-02-10 ENCOUNTER — Non-Acute Institutional Stay (SKILLED_NURSING_FACILITY): Payer: Medicare Other | Admitting: Adult Health

## 2021-02-10 ENCOUNTER — Other Ambulatory Visit (HOSPITAL_COMMUNITY)
Admission: RE | Admit: 2021-02-10 | Discharge: 2021-02-10 | Disposition: A | Discharge status: Home / Self Care | Payer: Medicare Other | Source: Skilled Nursing Facility | Attending: Family Medicine | Admitting: Family Medicine

## 2021-02-10 DIAGNOSIS — Z952 Presence of prosthetic heart valve: Secondary | ICD-10-CM | POA: Diagnosis not present

## 2021-02-10 DIAGNOSIS — Z7901 Long term (current) use of anticoagulants: Secondary | ICD-10-CM | POA: Insufficient documentation

## 2021-02-10 DIAGNOSIS — I5022 Chronic systolic (congestive) heart failure: Secondary | ICD-10-CM

## 2021-02-10 DIAGNOSIS — N184 Chronic kidney disease, stage 4 (severe): Secondary | ICD-10-CM

## 2021-02-10 DIAGNOSIS — I4819 Other persistent atrial fibrillation: Secondary | ICD-10-CM | POA: Diagnosis not present

## 2021-02-10 DIAGNOSIS — I130 Hypertensive heart and chronic kidney disease with heart failure and stage 1 through stage 4 chronic kidney disease, or unspecified chronic kidney disease: Secondary | ICD-10-CM

## 2021-02-10 DIAGNOSIS — I5043 Acute on chronic combined systolic (congestive) and diastolic (congestive) heart failure: Secondary | ICD-10-CM | POA: Diagnosis not present

## 2021-02-10 LAB — PROTIME-INR
INR: 3.6 — ABNORMAL HIGH (ref 0.8–1.2)
Prothrombin Time: 35.9 seconds — ABNORMAL HIGH (ref 11.4–15.2)

## 2021-02-10 NOTE — Progress Notes (Signed)
Location:  [facility] Room Number: ccc Place of Service:  SNF (31)   CODE STATUS: full code   Allergies  Allergen Reactions  . Penicillins Other (See Comments)    Bruise    . Aspirin Other (See Comments)    On coumadin  . Fentanyl Dermatitis    Rash with patch   . Niacin Itching    Chief Complaint  Patient presents with  . Acute Visit    Care plan meeting.     HPI:  We have come together for her care plan meeting: family present  BIMS 14/15 mood 5/30: decreased energy; decreased appetite; trouble concentrating.  She is nonambulatory. She requires extensive assist with her adls. She is frequently incontinent of bladder and bowel; 02 sats are stable at 94-100%. Therapy doing well minimal to contact for transfers; ambulating 20-30 feet with rolling walker contact guard;  min to mod upper mod to max for lower body    Weight good appetite; soft diet 75-100% weight is 197 pounds   She is on long term coumadin therapy for her MVR. Her inr goal is 2.5-3.5. her INR today is 3.6 and she is taking 10 mg daily. There are no reports of bleeding present. She will continue to be followed for her chronic illnesses including:  Persistent atrial fibrillation  Hypertensive heart and kidney disease with chronic systolic congestive heart failure and stage 4 chronic kidney disease  Acute on chronic combined systolic and diastolic congestive heart failure  S/p MVR   Past Medical History:  Diagnosis Date  . Acute on chronic diastolic CHF (congestive heart failure) ([place]) 05/19/2012  . Acute on chronic respiratory failure with hypoxia ([place]) 01/31/2020  . Adenomatous polyp of colon 12/16/2002   Dr. [name]/[facility]  . Allergy   . Calculus of gallbladder with chronic cholecystitis without obstruction 02/09/2009   Qualifier: Diagnosis of  By: [name], [name]    . Cataract   . CHB (complete heart block) ([place]) 10/07/2014      . CHF (congestive  heart failure) ([place])    a.  EF previously reduced to 25-30% b. EF improved to 60-65% by echo in 10/2017.   Marland Kitchen Chronic gout due to renal impairment    Per [facility] Records [facility]   . Chronic kidney disease, stage I 2006  . Cognitive communication deficit    Per [facility] Records [facility]   . Constipation       . Convulsion ([place])    Per [facility] Records [facility]   . COPD (chronic obstructive pulmonary disease) ([place])       . DJD (degenerative joint disease) of lumbar spine   . DM (diabetes mellitus) ([place]) 2006  . Dysphagia, unspecified    Per [facility] Records [facility]   . Esophageal reflux   . Glaucoma   . Gout       . H/O adenomatous polyp of colon 05/24/2012   2004 in [place] colonoscopy 2008 normal    . H/O heart valve replacement with mechanical valve    a. s/p mechanical AVR in 2001 and mechanical MVR in 2004.  Marland Kitchen H/O wisdom tooth extraction   . HIP PAIN, RIGHT 04/23/2009   Qualifier: Diagnosis of  By: [name], [name]    . Hyperkalemia    Per [facility] Records [facility]   .  Hyperlipemia   . Hypertensive heart and kidney disease with HF and with CKD stage I-IV ([place])    Per [facility] Records [facility]   . IDA (iron deficiency anemia)    Parenteral iron/[name]  . Insomnia       . Localized edema    Left arm, Per [facility] Records [facility]   . Long term (current) use of anticoagulants    Per [facility] Records [facility]   . Nausea without vomiting 06/07/2010   Qualifier: Diagnosis of  By: [name], [name]    . Non-ischemic cardiomyopathy ([place])    a. s/p MDT CRTD  . Obesity, unspecified   . Oxygen deficiency 2014   nocturnal;  . Papilloma of breast 03/06/2012    This was diagnosed as a left breast papilloma by ultrasound characteristics and symptoms in 2010. Because of possible medical risk factors no surgical intervention has been done and it did doing annual followups. The area has been stable by physical examination and mammograms and ultrasound since 2010.   . Presence of cardiac pacemaker    Per [facility] Records [facility]   . Spinal stenosis of lumbar region 02/19/2013  . Spondylolisthesis   . Spondylosis   . Thyroid cancer ([place])    remote thyroidectomy, no recurrence, pt denies in 2016 thyroid cancer  . Type 2 diabetes mellitus with hyperglycemia ([place]) 07/15/2010  . Type 2 diabetes mellitus with hypoglycemia without coma ([place])    Per [facility] Records [facility]   . Unspecified chronic bronchitis ([place])    Per [facility] Records [facility]   . Unspecified hypothyroidism       . Urinary tract infection, site not specified    Per [facility] Records [facility]   . 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 Vertigo         Past Surgical History:  Procedure Laterality Date  . AORTIC AND MITRAL VALVE REPLACEMENT  2001  . BI-VENTRICULAR IMPLANTABLE CARDIOVERTER DEFIBRILLATOR UPGRADE N/A 01/18/2015   Procedure: BI-VENTRICULAR IMPLANTABLE CARDIOVERTER DEFIBRILLATOR UPGRADE;  Surgeon: Evans Lance, MD;  Location: [facility] CATH LAB;  Service: Cardiovascular;  Laterality: N/A;  . BIV ICD GENERATOR CHANGEOUT N/A 01/30/2019   Procedure: BIV ICD GENERATOR CHANGEOUT;  Surgeon: Evans Lance, MD;  Location: [facility] CV LAB;  Service: Cardiovascular;  Laterality: N/A;  . CARDIAC CATHETERIZATION    . CARDIAC VALVE REPLACEMENT    . CARDIOVERSION N/A 11/13/2016   Procedure: CARDIOVERSION;  Surgeon: Sanda Klein, MD;  Location: [facility] ENDOSCOPY;  Service: Cardiovascular;  Laterality: N/A;  . COLONOSCOPY   06/12/2007   [place]k- Normal rectum/Normal colon  . COLONOSCOPY  12/16/02   small hemorrhoids  . COLONOSCOPY  06/20/2012   Procedure: COLONOSCOPY;  Surgeon: Daneil Dolin, MD;  [facility];  Service: Endoscopy;  Laterality: N/A;  10:45  . defibrillator implanted 2006    . DOPPLER ECHOCARDIOGRAPHY  2012  . DOPPLER ECHOCARDIOGRAPHY  05,06,07,08,09,2011  . ESOPHAGOGASTRODUODENOSCOPY  06/12/2007   [place]ourk- normal esophagus, small hiatal hernia, otherwise normal stomach, D1, D2  . EYE SURGERY Right 2005  . EYE SURGERY Left 2006  . ICD LEAD REMOVAL Left 02/08/2015   Procedure: ICD LEAD REMOVAL;  Surgeon: Evans Lance, MD;  Location: [facility];  Service: Cardiovascular;  Laterality: Left;  "Will plan extraction and  insertion of a BiV PM"  **Dr. Roxan Hockey backing up case**  . IMPLANTABLE CARDIOVERTER DEFIBRILLATOR (ICD) GENERATOR CHANGE Left 02/08/2015   Procedure: ICD GENERATOR CHANGE;  Surgeon: Evans Lance, MD;  Location: [facility];  Service: Cardiovascular;  Laterality: Left;  . INSERT / REPLACE / REMOVE PACEMAKER    . PACEMAKER INSERTION  May 2016  . pacemaker placed  2004  . right breast cyst removed     benign   . right cataract removed     2005  . TEE WITHOUT CARDIOVERSION N/A 11/13/2016   Procedure: TRANSESOPHAGEAL ECHOCARDIOGRAM (TEE);  Surgeon: Sanda Klein, MD;  Location: [facility] ENDOSCOPY;  Service: Cardiovascular;  Laterality: N/A;  . THYROIDECTOMY      Social History   Socioeconomic History  . Marital status: Widowed    Spouse name: Not on file  . Number of children: 0  . Years of education: Not on file  . Highest education level: Not on file  Occupational History  . Occupation: retired    Fish farm manager: RETIRED  Tobacco Use  . Smoking status: Former Smoker    Packs/day: 0.75    Years: 40.00    Pack years: 30.00    Types: Cigarettes    Quit date: 03/08/1987    Years since quitting: 33.9  . Smokeless tobacco: Never Used  Vaping Use  . Vaping Use: Never used   Substance and Sexual Activity  . Alcohol use: No    Alcohol/week: 0.0 standard drinks  . Drug use: No  . Sexual activity: Not Currently  Other Topics Concern  . Not on file  Social History Narrative   From [place] (2004)   Social Determinants of Health   Financial Resource Strain: Low Risk   . Difficulty of Paying Living Expenses: Not hard at all  Food Insecurity: No Food Insecurity  . Worried About Charity fundraiser in the Last Year: Never true  . Ran Out of Food in the Last Year: Never true  Transportation Needs: No Transportation Needs  . Lack of Transportation (Medical): No  . Lack of Transportation (Non-Medical): No  Physical Activity: Inactive  . Days of Exercise per Week: 0 days  . Minutes of Exercise per Session: 0 min  Stress: No Stress Concern Present  . Feeling of Stress : Only a little  Social Connections: Moderately Isolated  . Frequency of Communication with Friends and Family: More than three times a week  . Frequency of Social Gatherings with Friends and Family: Twice a week  . Attends Religious Services: More than 4 times per year  . Active Member of Clubs or Organizations: No  . Attends Archivist Meetings: Never  . Marital Status: Widowed  Intimate Partner Violence: Not on file   Family History  Problem Relation Age of Onset  . Pancreatic cancer Mother   . Heart disease Father   . Heart disease Sister   . Heart attack Sister   . Heart disease Brother   . Diabetes Brother   . Heart disease Brother   . Diabetes Brother   . Hypertension Brother   . Lung cancer Brother       VITAL SIGNS BP (!) 131/55   Pulse 62   Temp 97.6 F (36.4 C)   Resp 18   Ht 5\' 7"  (1.702 m)   Wt 197 lb 3.2 oz (89.4 kg)   SpO2 95%   BMI 30.89 kg/m   Outpatient Encounter Medications as of 02/10/2021  Medication Sig  . acetaminophen (  TYLENOL) 500 MG tablet Take 500 mg by mouth every 6 (six) hours as needed.  Marland Kitchen albuterol (PROVENTIL) (2.5 MG/3ML) 0.083%  nebulizer solution Take 2.5 mg by nebulization in the morning and at bedtime.  Marland Kitchen albuterol (VENTOLIN HFA) 108 (90 Base) MCG/ACT inhaler Inhale 1 puff into the lungs every 6 (six) hours as needed for wheezing or shortness of breath.  . allopurinol (ZYLOPRIM) 100 MG tablet TAKE ONE TABLET BY MOUTH ONCE DAILY.  . benzonatate (TESSALON) 100 MG capsule Take 100 mg by mouth 3 (three) times daily as needed for cough.  . cholecalciferol (VITAMIN D3) 25 MCG (1000 UNIT) tablet Take 1 tablet (1,000 Units total) by mouth in the morning.  . fluticasone (FLONASE) 50 MCG/ACT nasal spray Place 2 sprays into both nostrils daily.  . fluticasone-salmeterol (ADVAIR) 250-50 MCG/ACT AEPB Inhale 1 puff into the lungs in the morning and at bedtime.  . folic acid (FOLVITE) 1 MG tablet TAKE ONE TABLET BY MOUTH ONCE DAILY.  . furosemide (LASIX) 40 MG tablet Take 1 tablet (40 mg total) by mouth daily. Advised to take 40 mg Lasix once a day alternating with 40 mg Lasix twice daily.  Marland Kitchen gabapentin (NEURONTIN) 300 MG capsule TAKE (1) CAPSULE BY MOUTH TWICE DAILY  . hydrALAZINE (APRESOLINE) 10 MG tablet Take 1 tablet (10 mg total) by mouth every 8 (eight) hours.  . isosorbide mononitrate (IMDUR) 30 MG 24 hr tablet Take 0.5 tablets (15 mg total) by mouth daily.  Marland Kitchen levETIRAcetam (KEPPRA) 500 MG tablet TAKE (1) TABLET BY MOUTH TWICE DAILY AT 9AM AND 9PM  . metoprolol succinate (TOPROL-XL) 50 MG 24 hr tablet Take 1 tablet (50 mg total) by mouth daily. Take with or immediately following a meal.  . nitroGLYCERIN (NITROSTAT) 0.4 MG SL tablet DISSOLVE 1 TABLET SUBLINGUALLY AS NEEDED FOR CHEST PAIN, MAY REPEAT EVERY 5 MINUTES. AFTER 3 CALL 911. (Patient taking differently: Place 0.4 mg under the tongue every 5 (five) minutes as needed for chest pain.)  . NON FORMULARY Diet :mechanical soft diet with ground meats, NAS, cons CHO  . ondansetron (ZOFRAN-ODT) 4 MG disintegrating tablet TAKE (1) TABLET BY MOUTH EVERY EIGHT HOURS AS NEEDED FOR  NAUSEA OR VOMITING (Patient taking differently: Take 4 mg by mouth every 8 (eight) hours as needed for nausea or vomiting.)  . OXYGEN Inhale 3 L into the lungs continuous.  . polyethylene glycol (MIRALAX / GLYCOLAX) 17 g packet Take 17 g by mouth daily as needed. (Patient taking differently: Take 17 g by mouth daily as needed for mild constipation.)  . potassium chloride SA (KLOR-CON) 20 MEQ tablet TAKE ONE TABLET BY MOUTH ONCE DAILY.  . pravastatin (PRAVACHOL) 40 MG tablet TAKE ONE TABLET BY MOUTH ONCE DAILY.  . SYNTHROID 88 MCG tablet TAKE 1 TABLET BEFORE BREAKFAST.  Marland Kitchen UNABLE TO FIND Compression Stockings R-  Ankle  26.5cm   Calf 38cm  Leg  44cm L-  Ankle  27.5 cm   Calf 39cm  Leg  44cm (Patient taking differently: Compression Stockings R-  Ankle  26.5cm   Calf 38cm  Leg  44cm L-  Ankle  27.5 cm   Calf 39cm  Leg  44cm)  . warfarin (COUMADIN) 10 MG tablet TAKE (1) TABLET BY MOUTH DAILY AT 4PM (Patient taking differently: Take 10 mg by mouth daily at 6 (six) AM.)   No facility-administered encounter medications on file as of 02/10/2021.     SIGNIFICANT DIAGNOSTIC EXAMS  PREVIOUS   01-18-21: chest x-ray: Cardiomegaly with mild  central vascular congestion. No focal consolidation  01-18-21: renal ultrasound:  1. Mild right hydronephrosis. 2. Right renal parenchymal thinning and increased echogenicity consistent with chronic medical renal disease. 3. Significantly limited evaluation of the left kidney, no gross hydronephrosis  01-21-21: 2-d echo:  Left ventricular ejection fraction, by estimation, is 25 to 30%. The  left ventricle has severely decreased function. The left ventricle  demonstrates global hypokinesis. The left ventricular internal cavity size  was moderately dilated. There is mild  concentric left ventricular hypertrophy. Left ventricular diastolic  parameters are indeterminate.   01-27-21 left elbow x-ray:  Soft tissue swelling. No fracture or dislocation. No appreciable  arthropathy. Atherosclerotic arterial vascular calcifications noted.  NO NEW EXAMS.    LABS REVIEWED PREVIOUS   01-18-21: wbc 5.1; hgb 10.5; hct 35.9; mcv 93.0 plt 166; glucose 104; bun 87; creat 2.84; k+ 4.6; na++ 139; ca 9.5 GFR 16; phos 4.6 albumin 3.6; INR 1.6 01-21-21: wbc 7.5; hgb 8.5; hct 27.9; mcv 89.7 plt 157; glucose 105; bun 91; creat 1.93; k+ 4.4; na++ 139; ca 8.9; GFR 25;  01-28-21: wbc 7.5; hgb 8.0; hct 25.8; mcv 90.5 plt 169; glucose 102; bun 89; creat 1.72; k+ 4.5; na++ 136; ca 8.6 GFR 29; mag 2.0; phos 4.0 01-31-21; INR 1.2 (goal 2.5-3.5)  02-07-21: glucose 86; bun 65; creat 1.58; k+ 4.6; na++ 136; ca 8.7 GFR 32; liver normal albumin 3.0   TODAY  02-10-21: INR 3.6 on 10 mg coumadin   Review of Systems  Constitutional: Negative for malaise/fatigue.  Respiratory: Negative for cough and shortness of breath.   Cardiovascular: Negative for chest pain, palpitations and leg swelling.  Gastrointestinal: Negative for abdominal pain, constipation and heartburn.  Musculoskeletal: Negative for back pain, joint pain and myalgias.       Chronic left arm edema   Skin: Negative.   Neurological: Negative for dizziness.  Psychiatric/Behavioral: The patient is not nervous/anxious.       Physical Exam Constitutional:      General: She is not in acute distress.    Appearance: She is well-developed. She is obese. She is not diaphoretic.  Neck:     Thyroid: No thyromegaly.  Cardiovascular:     Rate and Rhythm: Normal rate and regular rhythm.     Pulses: Normal pulses.     Heart sounds: Normal heart sounds.     Comments:  Psychologist, forensic MVP Pulmonary:     Effort: Pulmonary effort is normal. No respiratory distress.     Breath sounds: Normal breath sounds.     Comments: 02 dependent  Abdominal:     General: Bowel sounds are normal. There is no distension.     Palpations: Abdomen is soft.     Tenderness: There is no abdominal tenderness.  Musculoskeletal:        General: Normal range of  motion.     Cervical back: Neck supple.     Right lower leg: No edema.     Left lower leg: No edema.     Comments: Has bruising present left upper extremity  Has left upper extremity swelling present.     Lymphadenopathy:     Cervical: No cervical adenopathy.  Skin:    General: Skin is warm and dry.  Neurological:     Mental Status: She is alert and oriented to person, place, and time.  Psychiatric:        Mood and Affect: Mood normal.      ASSESSMENT/ PLAN:  TODAY  1. Persistent  atrial fibrillation 2. Hypertensive heart and kidney disease with chronic systolic congestive heart failure and stage 4 chronic kidney disease 3. Acute on chronic combined systolic and diastolic congestive heart failure 4. S/p MVR    Will continue current medications Will continue current plan of care Will continue therapy as directed Will check INR on 02-17-21 The goal of her care is to return back home. There is no time range for discharge at this time. She continues to progress   Time spent with patient 40 minutes coordination of care: therapy; medications; lab results; goals of care.    Ok Edwards NP [facility]  Contact [phone] Monday through Friday 8am- 5pm  After hours call [phone]

## 2021-02-11 ENCOUNTER — Encounter: Payer: Self-pay | Admitting: Adult Health

## 2021-02-11 ENCOUNTER — Ambulatory Visit: Payer: Medicare Other | Admitting: Adult Health

## 2021-02-11 ENCOUNTER — Ambulatory Visit (INDEPENDENT_AMBULATORY_CARE_PROVIDER_SITE_OTHER): Payer: Medicare Other | Admitting: Adult Health

## 2021-02-11 VITALS — BP 126/64 | HR 61 | Ht 67.0 in | Wt 184.8 lb

## 2021-02-11 DIAGNOSIS — J42 Unspecified chronic bronchitis: Secondary | ICD-10-CM

## 2021-02-11 DIAGNOSIS — Z952 Presence of prosthetic heart valve: Secondary | ICD-10-CM | POA: Diagnosis not present

## 2021-02-11 DIAGNOSIS — I10 Essential (primary) hypertension: Secondary | ICD-10-CM

## 2021-02-11 NOTE — Patient Instructions (Signed)
Medication Instructions:  ?Your physician recommends that you continue on your current medications as directed. Please refer to the Current Medication list given to you today. ? ?*If you need a refill on your cardiac medications before your next appointment, please call your pharmacy* ? ?Lab Work: ?NONE ordered at this time of appointment  ? ?If you have labs (blood work) drawn today and your tests are completely normal, you will receive your results only by: ?MyChart Message (if you have MyChart) OR ?A paper copy in the mail ?If you have any lab test that is abnormal or we need to change your treatment, we will call you to review the results. ? ?Testing/Procedures: ?NONE ordered at this time of appointment  ? ?Follow-Up: ?At [facility], you and your health needs are our priority.  As part of our continuing mission to provide you with exceptional heart care, we have created designated Provider Care Teams.  These Care Teams include your primary Cardiologist (physician) and Advanced Practice Providers (APPs -  Physician Assistants and Nurse Practitioners) who all work together to provide you with the care you need, when you need it. ? ?We recommend signing up for the patient portal called "MyChart".  Sign up information is provided on this After Visit Summary.  MyChart is used to connect with patients for Virtual Visits (Telemedicine).  Patients are able to view lab/test results, encounter notes, upcoming appointments, etc.  Non-urgent messages can be sent to your provider as well.   ?To learn more about what you can do with MyChart, go to [email or web address].   ? ?Your next appointment:   ?3 month(s) ? ?The format for your next appointment:   ?In Person ? ?Provider:   ?Mihai Croitoru, MD   ? ?Other Instructions ? ? ?

## 2021-02-15 ENCOUNTER — Inpatient Hospital Stay (HOSPITAL_COMMUNITY): Payer: Medicare Other | Attending: Hematology

## 2021-02-15 ENCOUNTER — Inpatient Hospital Stay (HOSPITAL_COMMUNITY): Payer: Medicare Other

## 2021-02-15 ENCOUNTER — Telehealth: Payer: Self-pay | Admitting: Cardiovascular Disease

## 2021-02-15 ENCOUNTER — Ambulatory Visit (HOSPITAL_COMMUNITY): Payer: Medicare Other

## 2021-02-15 ENCOUNTER — Other Ambulatory Visit (HOSPITAL_COMMUNITY): Payer: Medicare Other

## 2021-02-15 VITALS — BP 122/62 | HR 64 | Temp 97.9°F | Resp 18

## 2021-02-15 DIAGNOSIS — N183 Chronic kidney disease, stage 3 unspecified: Secondary | ICD-10-CM | POA: Insufficient documentation

## 2021-02-15 DIAGNOSIS — N184 Chronic kidney disease, stage 4 (severe): Secondary | ICD-10-CM

## 2021-02-15 DIAGNOSIS — D649 Anemia, unspecified: Secondary | ICD-10-CM

## 2021-02-15 DIAGNOSIS — D631 Anemia in chronic kidney disease: Secondary | ICD-10-CM | POA: Insufficient documentation

## 2021-02-15 DIAGNOSIS — E611 Iron deficiency: Secondary | ICD-10-CM

## 2021-02-15 LAB — CBC WITH DIFFERENTIAL/PLATELET
Abs Immature Granulocytes: 0.01 10*3/uL (ref 0.00–0.07)
Basophils Absolute: 0 10*3/uL (ref 0.0–0.1)
Basophils Relative: 0 %
Eosinophils Absolute: 0 10*3/uL (ref 0.0–0.5)
Eosinophils Relative: 0 %
HCT: 27.3 % — ABNORMAL LOW (ref 36.0–46.0)
Hemoglobin: 8.2 g/dL — ABNORMAL LOW (ref 12.0–15.0)
Immature Granulocytes: 0 %
Lymphocytes Relative: 15 %
Lymphs Abs: 0.6 10*3/uL — ABNORMAL LOW (ref 0.7–4.0)
MCH: 27.2 pg (ref 26.0–34.0)
MCHC: 30 g/dL (ref 30.0–36.0)
MCV: 90.7 fL (ref 80.0–100.0)
Monocytes Absolute: 0.4 10*3/uL (ref 0.1–1.0)
Monocytes Relative: 11 %
Neutro Abs: 2.9 10*3/uL (ref 1.7–7.7)
Neutrophils Relative %: 74 %
Platelets: 202 10*3/uL (ref 150–400)
RBC: 3.01 MIL/uL — ABNORMAL LOW (ref 3.87–5.11)
RDW: 18.6 % — ABNORMAL HIGH (ref 11.5–15.5)
WBC: 4 10*3/uL (ref 4.0–10.5)
nRBC: 0 % (ref 0.0–0.2)

## 2021-02-15 MED ORDER — EPOETIN ALFA-EPBX 40000 UNIT/ML IJ SOLN
40000.0000 [IU] | Freq: Once | INTRAMUSCULAR | Status: AC
  Administered 2021-02-15: 40000 [IU] via SUBCUTANEOUS
  Filled 2021-02-15: qty 1

## 2021-02-15 NOTE — Progress Notes (Signed)
[name] [name] presents today for Retacrit injection per the provider's orders.  HBG noted to be 8.2.  Stable during administration without incident; injection site WNL; see MAR for injection details.  Patient tolerated procedure well and without incident.  No questions or complaints noted at this time.  Discharged via wheelchair in stable condition.

## 2021-02-15 NOTE — Patient Instructions (Signed)
[facility]  Discharge Instructions: Thank you for choosing [facility] to provide your oncology and hematology care.  If you have a lab appointment with the Cancer Center, please come in thru the Main Entrance and check in at the main information desk.  Wear comfortable clothing and clothing appropriate for easy access to any Portacath or PICC line.   We strive to give you quality time with your provider. You may need to reschedule your appointment if you arrive late (15 or more minutes).  Arriving late affects you and other patients whose appointments are after yours.  Also, if you miss three or more appointments without notifying the office, you may be dismissed from the clinic at the provider's discretion.      For prescription refill requests, have your pharmacy contact our office and allow 72 hours for refills to be completed.    Today you received Retacrit injection      To help prevent nausea and vomiting after your treatment, we encourage you to take your nausea medication as directed.  BELOW ARE SYMPTOMS THAT SHOULD BE REPORTED IMMEDIATELY: *FEVER GREATER THAN 100.4 F (38 C) OR HIGHER *CHILLS OR SWEATING *NAUSEA AND VOMITING THAT IS NOT CONTROLLED WITH YOUR NAUSEA MEDICATION *UNUSUAL SHORTNESS OF BREATH *UNUSUAL BRUISING OR BLEEDING *URINARY PROBLEMS (pain or burning when urinating, or frequent urination) *BOWEL PROBLEMS (unusual diarrhea, constipation, pain near the anus) TENDERNESS IN MOUTH AND THROAT WITH OR WITHOUT PRESENCE OF ULCERS (sore throat, sores in mouth, or a toothache) UNUSUAL RASH, SWELLING OR PAIN  UNUSUAL VAGINAL DISCHARGE OR ITCHING   Items with * indicate a potential emergency and should be followed up as soon as possible or go to the Emergency Department if any problems should occur.  Please show the CHEMOTHERAPY ALERT CARD or IMMUNOTHERAPY ALERT CARD at check-in to the Emergency Department and triage nurse.  Should you have  questions after your visit or need to cancel or reschedule your appointment, please contact [facility] [phone]  and follow the prompts.  Office hours are 8:00 a.m. to 4:30 p.m. Monday - Friday. Please note that voicemails left after 4:00 p.m. may not be returned until the following business day.  We are closed weekends and major holidays. You have access to a nurse at all times for urgent questions. Please call the main number to the clinic [phone] and follow the prompts.  For any non-urgent questions, you may also contact your provider using MyChart. We now offer e-Visits for anyone 18 and older to request care online for non-urgent symptoms. For details visit [email or web address].   Also download the MyChart app! Go to the app store, search "MyChart", open the app, select [facility], and log in with your MyChart username and password.  Due to Covid, a mask is required upon entering the hospital/clinic. If you do not have a mask, one will be given to you upon arrival. For doctor visits, patients may have 1 support person aged 18 or older with them. For treatment visits, patients cannot have anyone with them due to current Covid guidelines and our immunocompromised population.  

## 2021-02-15 NOTE — Telephone Encounter (Signed)
Spoke with Anderson Malta with [facility] who requests Dr. Sallyanne Kuster sign an order from March for the patient's repeat INR which was ordered verbally by Edrick Oh, RN. She states the order was previously faxed but they did not receive it back and it needs to be signed by Dr. Sallyanne Kuster. I verified that she has correct fax number.

## 2021-02-15 NOTE — Telephone Encounter (Signed)
New message:    [name] [name] from [facility] need a order that was faxed on 02/09/21 sign and faxed back to [phone]

## 2021-02-15 NOTE — Telephone Encounter (Signed)
Form was signed and faxed today.

## 2021-02-16 ENCOUNTER — Non-Acute Institutional Stay: Payer: Medicare Other | Admitting: Nurse Practitioner

## 2021-02-16 DIAGNOSIS — Z515 Encounter for palliative care: Secondary | ICD-10-CM

## 2021-02-16 DIAGNOSIS — R531 Weakness: Secondary | ICD-10-CM

## 2021-02-16 NOTE — Progress Notes (Signed)
[facility] Palliative Care Consult Note Telephone: [phone]  Fax: [phone]    Date of encounter: 02/16/21 PATIENT NAME: [name] [name] [id number]   [phone] (home)  DOB: June 16, 1932 MRN: [id number]  PRIMARY CARE PROVIDER:    Fayrene Helper, MD,  [address], [place] [address] [phone]  REFERRING PROVIDER:   Fayrene Helper, MD [address] [place],   [address] [phone]  RESPONSIBLE PARTY:    Contact Information    Name Relation Home Work [place] Niece [phone]  [phone]   Rucker,Beverly Niece [phone]     Velna Ochs [phone]  [phone]     I met face to face with patient in [facility] facility. Palliative Care was asked to follow this patient by consultation request of  Fayrene Helper, MD to address advance care planning and complex medical decision making. This is the initial visit while in SNF.                                   ASSESSMENT AND PLAN / RECOMMENDATIONS:   Advance Care Planning/Goals of Care: Goals include to maximize quality of life and symptom management. Our advance care planning conversation included a discussion about:     The value and importance of advance care planning   Experiences with loved ones who have been seriously ill or have died   Exploration of personal, cultural or spiritual beliefs that might influence medical decisions   Exploration of goals of care in the event of a sudden injury or illness   CODE STATUS: Full code Goal care: Patient verbalized her goal is get well and stronger and be able to return to her home. Directives: Patient verbalized she would not want to be resuscitated if she is found without a pulse or not breathing. She however said wants resuscitiaton because that is what her niece Franki Monte would want. She report that one of her relations was  resuscitated and survived. Patient made aware that the decision to be resuscitated or not resuscitated is a personal decision.  Discussed need for patient to make decision based on her own preference and not of anyone else. The need to complete a MOST form was also discussed. Patient declined completing a MOST form today, she asked that the discussion be revisited when she returns to her home, saying all she want to know is when she would be discharged home. Validation provided. It was explained to patient that code status will be regularly reviewed to ensure that it reflects patient wishes. Palliative care will continue to provide support to patient, family and the medical team.   Symptom Management/Plan: Generalized weakness: condition is related to deconditioning from recent hospitalization. Facility staff report poor progression with physical therapy, report patient currently wheelchair level for her function. She is not able to ambulate independently but able to complete some of her ADLs independently. Patient voiced no concerns during visit today. Recommend continuing therapy as scheduled. Maintain safety, prevent falls. Provided general support and encouragement. Questions and concerns were addressed. Patient was encouraged to call with questions and/or concerns. My business card was provided.  Visit update discussed with patient's niece Dollree, Dollree asked to be reminded of the date for the next visit as she want to participate in the visit discussions.   Follow up Palliative Care Visit: Palliative  care will continue to follow for complex medical decision making, advance care planning, and clarification of goals. Return in about 4-6 weeks or prn.  I spent 60 minutes providing this consultation. More than 50% of the time in this consultation was spent in counseling and care coordination.  PPS: 50% weak  HOSPICE ELIGIBILITY/DIAGNOSIS: TBD  History obtained from review of EMR, discussion with  facility staff and interview with Ms. Baksh and her Niece.  HISTORY OF PRESENT ILLNESS:  Tavaria Mackins is a 87 y.o. year old female with multiple medical problems including diastolic HF (EF 01-02% 7253), CKD, COPD dependent on supplemental oxygen, complete heart block s/p pacemaker, DJD of left shoulder joint, type 2 DM, Glaucoma, Gout, hx of thyroid cancer, seizure disorder.  Patient is s/p hospitalization from 01/18/2021 to 01/28/2021 for respiratory failure secondary to COPD and CHF exacerbation. Patient is currently in SNF for rehabilitation and nursing services related to deconditioning from hospitalization.  Physical Exam: General: chronically ill and frail appearing, sitting in wheel chair in NAD Cardiovascular: no chest pain reported, trace edema  Pulmonary: no cough, no increased SOB, oxygen saturation 98% on 3L. Abdomen: appetite fair, denied constipation, continent of bowel GU: denies dysuria, incontinent of urine MSK:  no joint and ROM abnormalities Skin: no rashes or wounds reported Neurological: Weakness, but otherwise nonfocal  CURRENT PROBLEM LIST:  Patient Active Problem List   Diagnosis Date Noted  . Protein-calorie malnutrition, moderate ([place]) 02/08/2021  . Aortic atherosclerosis ([place]) 01/31/2021  . Other idiopathic peripheral autonomic neuropathy 01/31/2021  . Anemia due to stage 4 chronic kidney disease ([place]) 01/31/2021  . Acute renal failure superimposed on stage 4 chronic kidney disease ([place])   . Acute respiratory failure with hypoxia ([place]) 01/18/2021  . Acute respiratory failure with hypoxia and hypercapnia ([place]) 01/18/2021  . Gait abnormality 08/24/2020  . Chronic constipation 08/09/2020  . Acute UTI 08/03/2020  . Hypertensive heart and kidney disease with chronic systolic congestive heart failure and stage 4 chronic kidney disease ([place]) 07/15/2020  . Hyperlipidemia associated with type 2 diabetes mellitus ([place]) 07/15/2020  . Seizure ([place]) 07/07/2020  . Goals  of care, counseling/discussion   . Palliative care by specialist   . Idiopathic hypotension   . Arthritis of right knee 06/20/2020  . Leg swelling 06/16/2020  . Generalized osteoarthritis 05/09/2020  . Primary osteoarthritis, left shoulder 04/21/2020  . Acute systolic CHF (congestive heart failure) ([place]) 03/31/2020  . Bilateral lower extremity edema   . Right-sided epistaxis 03/28/2020  . Acute pain of left shoulder 01/15/2020  . AAA (abdominal aortic aneurysm) without rupture ([place]) 01/14/2020  . COVID-19 virus detected 11/23/2019  . Stage 3b chronic kidney disease ([place]) 08/04/2019  . Dyspnea 05/30/2019  . Anemia 05/30/2019  . Gout   . Glaucoma   . GERD (gastroesophageal reflux disease)   . Physical debility 04/28/2019  . Encounter for examination following treatment at hospital 11/03/2018  . Hyponatremia 09/27/2018  . H/O mitral valve replacement with mechanical valve 08/02/2018  . Chronic right hip pain 04/16/2018  . COPD with acute exacerbation ([place]) 11/15/2017  . Trochanteric bursitis, right hip 03/07/2017  . Acute on chronic combined systolic and diastolic heart failure ([place]) 11/16/2016  . Persistent atrial fibrillation ([place])   . Nocturnal hypoxia 10/08/2016  . Dependence on nocturnal oxygen therapy 10/08/2016  . Hematuria 03/20/2016  . S/P ICD (internal cardiac defibrillator) procedure 02/08/2015  . Presence of cardiac pacemaker 02/08/2015  . CHB (complete heart block) ([place]) 10/07/2014  . Fatigue 07/20/2014  .  Absolute anemia 06/24/2014  . Osteopenia 02/10/2014  . Encounter for therapeutic drug monitoring 10/29/2013  . OA (osteoarthritis) of knee 02/19/2013  . Chronic pain of right knee 01/02/2013  . Hemolytic anemia ([place]) 09/24/2012  . High risk medication use 05/24/2012  . COPD (chronic obstructive pulmonary disease) ([place]) 05/19/2012  . Chronic anticoagulation, on coumadin for Mechanical AVR and MVR 04/01/2012  . Cardiorenal syndrome with renal failure 03/22/2012   . Hx of aortic valve replacement, mechanical 03/22/2012  . S/P MVR (mitral valve replacement) 03/22/2012  . Biventricular automatic implantable cardioverter defibrillator in situ 03/22/2012  . CKD (chronic kidney disease), stage IV ([place]) 03/22/2012  . Warfarin-induced coagulopathy ([place]) 03/21/2012  . Iron deficiency 10/04/2011  . Allergic rhinitis 02/14/2011  . Mitral valve disease 07/15/2010  . Sinus node dysfunction ([place]) 07/15/2010  . Type 2 diabetes mellitus with hypoglycemia without coma ([place]) 07/15/2010  . VARICOSE VEINS LOWER EXTREMITIES W/OTH COMPS 02/09/2009  . Prediabetes 06/15/2008  . Hypothyroidism, postsurgical 02/24/2008  . Hyperlipidemia LDL goal <70 02/24/2008  . Essential hypertension 02/24/2008  . Non-ischemic cardiomyopathy with ICD 02/24/2008  . Spondylosis 02/24/2008   PAST MEDICAL HISTORY:  Active Ambulatory Problems    Diagnosis Date Noted  . Hypothyroidism, postsurgical 02/24/2008  . Prediabetes 06/15/2008  . Hyperlipidemia LDL goal <70 02/24/2008  . Essential hypertension 02/24/2008  . Non-ischemic cardiomyopathy with ICD 02/24/2008  . VARICOSE VEINS LOWER EXTREMITIES W/OTH COMPS 02/09/2009  . Spondylosis 02/24/2008  . Allergic rhinitis 02/14/2011  . Iron deficiency 10/04/2011  . Warfarin-induced coagulopathy ([place]) 03/21/2012  . Cardiorenal syndrome with renal failure 03/22/2012  . Hx of aortic valve replacement, mechanical 03/22/2012  . S/P MVR (mitral valve replacement) 03/22/2012  . Biventricular automatic implantable cardioverter defibrillator in situ 03/22/2012  . CKD (chronic kidney disease), stage IV ([place]) 03/22/2012  . Chronic anticoagulation, on coumadin for Mechanical AVR and MVR 04/01/2012  . COPD (chronic obstructive pulmonary disease) ([place]) 05/19/2012  . High risk medication use 05/24/2012  . Hemolytic anemia ([place]) 09/24/2012  . Chronic pain of right knee 01/02/2013  . OA (osteoarthritis) of knee 02/19/2013  . Encounter for therapeutic  drug monitoring 10/29/2013  . Osteopenia 02/10/2014  . Absolute anemia 06/24/2014  . Fatigue 07/20/2014  . CHB (complete heart block) ([place]) 10/07/2014  . S/P ICD (internal cardiac defibrillator) procedure 02/08/2015  . Hematuria 03/20/2016  . Nocturnal hypoxia 10/08/2016  . Dependence on nocturnal oxygen therapy 10/08/2016  . Persistent atrial fibrillation ([place])   . Acute on chronic combined systolic and diastolic heart failure ([place]) 11/16/2016  . Trochanteric bursitis, right hip 03/07/2017  . COPD with acute exacerbation ([place]) 11/15/2017  . Chronic right hip pain 04/16/2018  . H/O mitral valve replacement with mechanical valve 08/02/2018  . Hyponatremia 09/27/2018  . Encounter for examination following treatment at hospital 11/03/2018  . Mitral valve disease 07/15/2010  . Presence of cardiac pacemaker 02/08/2015  . Sinus node dysfunction ([place]) 07/15/2010  . Type 2 diabetes mellitus with hypoglycemia without coma ([place]) 07/15/2010  . Physical debility 04/28/2019  . Dyspnea 05/30/2019  . Gout   . Glaucoma   . GERD (gastroesophageal reflux disease)   . Anemia 05/30/2019  . COVID-19 virus detected 11/23/2019  . AAA (abdominal aortic aneurysm) without rupture ([place]) 01/14/2020  . Acute pain of left shoulder 01/15/2020  . Right-sided epistaxis 03/28/2020  . Acute systolic CHF (congestive heart failure) ([place]) 03/31/2020  . Bilateral lower extremity edema   . Primary osteoarthritis, left shoulder 04/21/2020  . Generalized osteoarthritis 05/09/2020  .  Leg swelling 06/16/2020  . Arthritis of right knee 06/20/2020  . Stage 3b chronic kidney disease ([place]) 08/04/2019  . Idiopathic hypotension   . Goals of care, counseling/discussion   . Palliative care by specialist   . Seizure ([place]) 07/07/2020  . Hypertensive heart and kidney disease with chronic systolic congestive heart failure and stage 4 chronic kidney disease ([place]) 07/15/2020  . Hyperlipidemia associated with type 2 diabetes  mellitus ([place]) 07/15/2020  . Acute UTI 08/03/2020  . Chronic constipation 08/09/2020  . Gait abnormality 08/24/2020  . Acute respiratory failure with hypoxia ([place]) 01/18/2021  . Acute respiratory failure with hypoxia and hypercapnia ([place]) 01/18/2021  . Acute renal failure superimposed on stage 4 chronic kidney disease ([place])   . Aortic atherosclerosis ([place]) 01/31/2021  . Other idiopathic peripheral autonomic neuropathy 01/31/2021  . Anemia due to stage 4 chronic kidney disease ([place]) 01/31/2021  . Protein-calorie malnutrition, moderate ([place]) 02/08/2021   Resolved Ambulatory Problems    Diagnosis Date Noted  . Dehydration 06/12/2010  . Mild obesity 02/24/2008  . Unspecified cardiovascular disease 12/23/2009  . SINUSITIS, ACUTE 05/27/2010  . GERD 02/24/2008  . Calculus of gallbladder with chronic cholecystitis without obstruction 02/09/2009  . CKD (chronic kidney disease) stage 4, GFR 15-29 ml/min, now improved to CKD stage 3 02/24/2008  . Other sign and symptom in breast 01/10/2010  . HIP PAIN, RIGHT 04/23/2009  . INSOMNIA 07/18/2010  . Nausea without vomiting 06/07/2010  . NECK SPASM 11/10/2008  . Heart valve replaced by other means 09/05/2008  . Headache(784.0) 10/18/2010  . Low back pain 06/29/2011  . Papilloma of breast 03/06/2012  . Acute respiratory failure with hypoxia ([place]) 03/21/2012  . CHF (congestive heart failure) ([place]) 03/21/2012  . H/O aortic valve replacement 03/21/2012  . CAP (community acquired pneumonia) 03/22/2012  . Acute on chronic systolic CHF (congestive heart failure) ([place]) 03/22/2012  . COPD exacerbation ([place]) 03/22/2012  . Elevated troponin 03/22/2012  . Encephalopathy acute 03/22/2012  . Pneumonia 03/24/2012  . Upper leg pain, rt. from use of BSC?   04/02/2012  . Acute on chronic diastolic CHF (congestive heart failure) ([place]) 05/19/2012  . Right thigh pain 05/19/2012  . H/O adenomatous polyp of colon 05/24/2012  . Cystitis, acute 05/29/2012  .  Otalgia of left ear 09/02/2012  . Routine general medical examination at a health care facility 09/17/2012  . Dizziness and giddiness 01/02/2013  . Spinal stenosis of lumbar region 02/19/2013  . Rotator cuff syndrome of left shoulder 02/19/2013  . Fatigue due to treatment 03/10/2013  . Cystitis 03/10/2013  . Blepharitis of left eye 04/13/2013  . Need for prophylactic vaccination and inoculation against influenza 06/03/2014  . Need for vaccination with 13-polyvalent pneumococcal conjugate vaccine 10/13/2014  . Back pain without radiation 10/13/2014  . Seasonal allergies 02/17/2015  . Medicare annual wellness visit, subsequent 02/17/2015  . Need for Tdap vaccination 02/17/2015  . New onset of headaches after age 89 06/21/2015  . Medicare annual wellness visit, subsequent 03/20/2016  . Fall at home, initial encounter 07/06/2016  . Acute bronchitis 10/08/2016  . SOB (shortness of breath) 11/16/2016  . Influenza 11/01/2017  . Hospital discharge follow-up 11/07/2017  . Chronic systolic congestive heart failure ([place]) 11/15/2017  . Volume overload 12/09/2017  . Hypothyroidism 07/15/2010  . Major depressive disorder with single episode 07/15/2010  . Other specified personal risk factors, not elsewhere classified 05/24/2012  . Type 2 diabetes mellitus with hyperglycemia ([place]) 07/15/2010  . Acute right-sided thoracic back pain   .  Acute exacerbation of CHF (congestive heart failure) ([place]) 01/31/2020  . Acute on chronic diastolic CHF (congestive heart failure) ([place]) 01/31/2020  . Acute on chronic respiratory failure with hypoxia ([place]) 01/31/2020  . Acute on chronic congestive heart failure ([place]) 03/30/2020  . CHF exacerbation ([place]) 06/29/2020   Past Medical History:  Diagnosis Date  . Adenomatous polyp of colon 12/16/2002  . Allergy   . Cataract   . Chronic gout due to renal impairment   . Chronic kidney disease, stage I 2006  . Cognitive communication deficit   . Constipation   .  Convulsion ([place])   . DJD (degenerative joint disease) of lumbar spine   . DM (diabetes mellitus) ([place]) 2006  . Dysphagia, unspecified   . H/O heart valve replacement with mechanical valve   . H/O wisdom tooth extraction   . Hyperkalemia   . Hyperlipemia   . Hypertensive heart and kidney disease with HF and with CKD stage I-IV ([place])   . IDA (iron deficiency anemia)   . Insomnia   . Localized edema   . Long term (current) use of anticoagulants   . Obesity, unspecified   . Oxygen deficiency 2014  . Spondylolisthesis   . Thyroid cancer ([place])   . Unspecified chronic bronchitis ([place])   . Unspecified hypothyroidism   . Urinary tract infection, site not specified   . Vertigo    SOCIAL HX:  Social History   Tobacco Use  . Smoking status: Former Smoker    Packs/day: 0.75    Years: 40.00    Pack years: 30.00    Types: Cigarettes    Quit date: 03/08/1987    Years since quitting: 33.9  . Smokeless tobacco: Never Used  Substance Use Topics  . Alcohol use: No    Alcohol/week: 0.0 standard drinks   FAMILY HX:  Family History  Problem Relation Age of Onset  . Pancreatic cancer Mother   . Heart disease Father   . Heart disease Sister   . Heart attack Sister   . Heart disease Brother   . Diabetes Brother   . Heart disease Brother   . Diabetes Brother   . Hypertension Brother   . Lung cancer Brother      ALLERGIES:  Allergies  Allergen Reactions  . Penicillins Other (See Comments)    Bruise    . Aspirin Other (See Comments)    On coumadin  . Fentanyl Dermatitis    Rash with patch   . Niacin Itching     Thank you for the opportunity to participate in the care of Ms. Beauchaine.  The palliative care team will continue to follow. Please call our office at [phone] if we can be of additional assistance.   Jari Favre, DNP, AGPCNP-BC  COVID-19 PATIENT SCREENING TOOL Asked and negative response unless otherwise noted:   Have you had symptoms of covid, tested  positive or been in contact with someone with symptoms/positive test in the past 5-10 days?

## 2021-02-17 ENCOUNTER — Other Ambulatory Visit (HOSPITAL_COMMUNITY)
Admission: RE | Admit: 2021-02-17 | Discharge: 2021-02-17 | Disposition: A | Discharge status: Home / Self Care | Payer: Medicare Other | Source: Skilled Nursing Facility | Attending: Adult Health | Admitting: Adult Health

## 2021-02-17 ENCOUNTER — Non-Acute Institutional Stay (SKILLED_NURSING_FACILITY): Payer: Medicare Other | Admitting: Adult Health

## 2021-02-17 ENCOUNTER — Encounter: Payer: Self-pay | Admitting: Adult Health

## 2021-02-17 DIAGNOSIS — Z7901 Long term (current) use of anticoagulants: Secondary | ICD-10-CM | POA: Diagnosis not present

## 2021-02-17 DIAGNOSIS — Z952 Presence of prosthetic heart valve: Secondary | ICD-10-CM | POA: Insufficient documentation

## 2021-02-17 DIAGNOSIS — I4819 Other persistent atrial fibrillation: Secondary | ICD-10-CM

## 2021-02-17 LAB — PROTIME-INR
INR: 4 — ABNORMAL HIGH (ref 0.8–1.2)
Prothrombin Time: 39 seconds — ABNORMAL HIGH (ref 11.4–15.2)

## 2021-02-17 NOTE — Progress Notes (Signed)
Location:  [place] Room Number: 159-P Place of Service:  SNF (31)   CODE STATUS: Full  Allergies  Allergen Reactions  . Penicillins Other (See Comments)    Bruise    . Aspirin Other (See Comments)    On coumadin  . Fentanyl Dermatitis    Rash with patch   . Niacin Itching   Chief Complaint  Patient presents with  . Acute Visit    INR management     HPI:  She is on long term coumadin therapy for her mitral valve replacement her INR goal is 2.5-3.5. she is presently taking coumadin 10 mg daily. Her INR today is 4.0. she denies any chest pain or shortness of breath. There are no reports of bleeding present. There are no reports of missed doses.    Past Medical History:  Diagnosis Date  . Acute on chronic diastolic CHF (congestive heart failure) ([place]) 05/19/2012  . Acute on chronic respiratory failure with hypoxia ([place]) 01/31/2020  . Adenomatous polyp of colon 12/16/2002   Dr. [name]/[facility]  . Allergy   . Calculus of gallbladder with chronic cholecystitis without obstruction 02/09/2009   Qualifier: Diagnosis of  By: [name], [name]    . Cataract   . CHB (complete heart block) ([place]) 10/07/2014      . CHF (congestive heart failure) ([place])    a.  EF previously reduced to 25-30% b. EF improved to 60-65% by echo in 10/2017.   Marland Kitchen Chronic gout due to renal impairment    Per [facility] Electronic Medical Records [facility]   . Chronic kidney disease, stage I 2006  . Cognitive communication deficit    Per [facility] Records [facility]   . Constipation       . Convulsion ([place])    Per [facility] Records [facility]   . COPD (chronic obstructive pulmonary disease) ([place])       . DJD (degenerative joint disease) of lumbar spine   . DM (diabetes mellitus) ([place]) 2006  . Dysphagia, unspecified    Per [facility]  Electronic Medical Records System   . Esophageal reflux   . Glaucoma   . Gout       . H/O adenomatous polyp of colon 05/24/2012   2004 in [place] colonoscopy 2008 normal    . H/O heart valve replacement with mechanical valve    a. s/p mechanical AVR in 2001 and mechanical MVR in 2004.  Marland Kitchen H/O wisdom tooth extraction   . HIP PAIN, RIGHT 04/23/2009   Qualifier: Diagnosis of  By: [name], [name]    . Hyperkalemia    Per [facility] Records [facility]   . Hyperlipemia   . Hypertensive heart and kidney disease with HF and with CKD stage I-IV ([place])    Per [facility] Records [facility]   . IDA (iron deficiency anemia)    Parenteral iron/[name]  . Insomnia       . Localized edema    Left arm, Per [facility] Records [facility]   . Long term (current) use of anticoagulants    Per [facility] Records [facility]   . Nausea without vomiting 06/07/2010   Qualifier: Diagnosis of  By: [name], [name]    . Non-ischemic cardiomyopathy ([place])    a. s/p MDT  CRTD  . Obesity, unspecified   . Oxygen deficiency 2014   nocturnal;  . Papilloma of breast 03/06/2012   This was diagnosed as a left breast papilloma by ultrasound characteristics and symptoms in 2010. Because of possible medical risk factors no surgical intervention has been done and it did doing annual followups. The area has been stable by physical examination and mammograms and ultrasound since 2010.   . Presence of cardiac pacemaker    Per [facility] Electronic Medical Records [facility]   . Spinal stenosis of lumbar region 02/19/2013  . Spondylolisthesis   . Spondylosis   . Thyroid cancer ([place])    remote thyroidectomy, no recurrence, pt denies in 2016 thyroid cancer  . Type 2 diabetes mellitus with hyperglycemia ([place]) 07/15/2010  . Type 2 diabetes mellitus with  hypoglycemia without coma ([place])    Per [facility] Records [facility]   . Unspecified chronic bronchitis ([place])    Per [facility] Records [facility]   . Unspecified hypothyroidism       . Urinary tract infection, site not specified    Per [facility] Records [facility]   . 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 Vertigo         Past Surgical History:  Procedure Laterality Date  . AORTIC AND MITRAL VALVE REPLACEMENT  2001  . BI-VENTRICULAR IMPLANTABLE CARDIOVERTER DEFIBRILLATOR UPGRADE N/A 01/18/2015   Procedure: BI-VENTRICULAR IMPLANTABLE CARDIOVERTER DEFIBRILLATOR UPGRADE;  Surgeon: Evans Lance, MD;  Location: [facility] CATH LAB;  Service: Cardiovascular;  Laterality: N/A;  . BIV ICD GENERATOR CHANGEOUT N/A 01/30/2019   Procedure: BIV ICD GENERATOR CHANGEOUT;  Surgeon: Evans Lance, MD;  Location: [place] CV LAB;  Service: Cardiovascular;  Laterality: N/A;  . CARDIAC CATHETERIZATION    . CARDIAC VALVE REPLACEMENT    . CARDIOVERSION N/A 11/13/2016   Procedure: CARDIOVERSION;  Surgeon: Sanda Klein, MD;  Location: [facility] ENDOSCOPY;  Service: Cardiovascular;  Laterality: N/A;  . COLONOSCOPY  06/12/2007   Rourk- Normal rectum/Normal colon  . COLONOSCOPY  12/16/02   small hemorrhoids  . COLONOSCOPY  06/20/2012   Procedure: COLONOSCOPY;  Surgeon: Daneil Dolin, MD;  Location: [facility] ENDO SUITE;  Service: Endoscopy;  Laterality: N/A;  10:45  . defibrillator implanted 2006    . DOPPLER ECHOCARDIOGRAPHY  2012  . DOPPLER ECHOCARDIOGRAPHY  05,06,07,08,09,2011  . ESOPHAGOGASTRODUODENOSCOPY  06/12/2007   Rourk- normal esophagus, small hiatal hernia, otherwise normal stomach, D1, D2  . EYE SURGERY Right 2005  . EYE SURGERY Left 2006  . ICD LEAD REMOVAL Left 02/08/2015   Procedure: ICD LEAD REMOVAL;  Surgeon: Evans Lance, MD;  Location: [facility] OR;  Service: Cardiovascular;   Laterality: Left;  "Will plan extraction and insertion of a BiV PM"  **Dr. Roxan Hockey backing up case**  . IMPLANTABLE CARDIOVERTER DEFIBRILLATOR (ICD) GENERATOR CHANGE Left 02/08/2015   Procedure: ICD GENERATOR CHANGE;  Surgeon: Evans Lance, MD;  Location: [facility];  Service: Cardiovascular;  Laterality: Left;  . INSERT / REPLACE / REMOVE PACEMAKER    . PACEMAKER INSERTION  May 2016  . pacemaker placed  2004  . right breast cyst removed     benign   . right cataract removed     2005  . TEE WITHOUT CARDIOVERSION N/A 11/13/2016   Procedure: TRANSESOPHAGEAL ECHOCARDIOGRAM (TEE);  Surgeon: Sanda Klein, MD;  Location: [facility] ENDOSCOPY;  Service: Cardiovascular;  Laterality: N/A;  . THYROIDECTOMY  Social History   Socioeconomic History  . Marital status: Widowed    Spouse name: Not on file  . Number of children: 0  . Years of education: Not on file  . Highest education level: Not on file  Occupational History  . Occupation: retired    Fish farm manager: RETIRED  Tobacco Use  . Smoking status: Former Smoker    Packs/day: 0.75    Years: 40.00    Pack years: 30.00    Types: Cigarettes    Quit date: 03/08/1987    Years since quitting: 33.9  . Smokeless tobacco: Never Used  Vaping Use  . Vaping Use: Never used  Substance and Sexual Activity  . Alcohol use: No    Alcohol/week: 0.0 standard drinks  . Drug use: No  . Sexual activity: Not Currently  Other Topics Concern  . Not on file  Social History Narrative   From [place] (2004)   Social Determinants of Health   Financial Resource Strain: Low Risk   . Difficulty of Paying Living Expenses: Not hard at all  Food Insecurity: No Food Insecurity  . Worried About Charity fundraiser in the Last Year: Never true  . Ran Out of Food in the Last Year: Never true  Transportation Needs: No Transportation Needs  . Lack of Transportation (Medical): No  . Lack of Transportation (Non-Medical): No  Physical Activity: Inactive  . Days of  Exercise per Week: 0 days  . Minutes of Exercise per Session: 0 min  Stress: No Stress Concern Present  . Feeling of Stress : Only a little  Social Connections: Moderately Isolated  . Frequency of Communication with Friends and Family: More than three times a week  . Frequency of Social Gatherings with Friends and Family: Twice a week  . Attends Religious Services: More than 4 times per year  . Active Member of Clubs or Organizations: No  . Attends Archivist Meetings: Never  . Marital Status: Widowed  Intimate Partner Violence: Not on file   Family History  Problem Relation Age of Onset  . Pancreatic cancer Mother   . Heart disease Father   . Heart disease Sister   . Heart attack Sister   . Heart disease Brother   . Diabetes Brother   . Heart disease Brother   . Diabetes Brother   . Hypertension Brother   . Lung cancer Brother       VITAL SIGNS BP (!) 131/55   Pulse 68   Temp (!) 97 F (36.1 C)   Resp 18   Ht 5\' 7"  (1.702 m)   Wt 200 lb 6.4 oz (90.9 kg)   SpO2 98%   BMI 31.39 kg/m   Outpatient Encounter Medications as of 02/17/2021  Medication Sig Note  . acetaminophen (TYLENOL) 500 MG tablet Take 500 mg by mouth every 6 (six) hours as needed.   Marland Kitchen albuterol (PROVENTIL) (2.5 MG/3ML) 0.083% nebulizer solution Take 2.5 mg by nebulization in the morning and at bedtime.   Marland Kitchen albuterol (VENTOLIN HFA) 108 (90 Base) MCG/ACT inhaler Inhale 1 puff into the lungs every 6 (six) hours as needed for wheezing or shortness of breath.   . allopurinol (ZYLOPRIM) 100 MG tablet TAKE ONE TABLET BY MOUTH ONCE DAILY.   Roseanne Kaufman Peru-Castor Oil (VENELEX) OINT Apply topically. topical, Every Shift, Apply to bilateral buttocks and sacrum qshift.   . benzonatate (TESSALON) 100 MG capsule Take 100 mg by mouth 3 (three) times daily as needed for cough.   Marland Kitchen  cholecalciferol (VITAMIN D3) 25 MCG (1000 UNIT) tablet Take 1 tablet (1,000 Units total) by mouth in the morning.   . fluticasone  (FLONASE) 50 MCG/ACT nasal spray Place 2 sprays into both nostrils daily.   . fluticasone-salmeterol (ADVAIR) 250-50 MCG/ACT AEPB Inhale 1 puff into the lungs in the morning and at bedtime.   . folic acid (FOLVITE) 1 MG tablet TAKE ONE TABLET BY MOUTH ONCE DAILY.   . furosemide (LASIX) 40 MG tablet Take 1 tablet (40 mg total) by mouth daily. Advised to take 40 mg Lasix once a day alternating with 40 mg Lasix twice daily.   Marland Kitchen gabapentin (NEURONTIN) 300 MG capsule TAKE (1) CAPSULE BY MOUTH TWICE DAILY   . hydrALAZINE (APRESOLINE) 10 MG tablet Take 1 tablet (10 mg total) by mouth every 8 (eight) hours.   . isosorbide mononitrate (IMDUR) 30 MG 24 hr tablet Take 0.5 tablets (15 mg total) by mouth daily.   Marland Kitchen levETIRAcetam (KEPPRA) 500 MG tablet TAKE (1) TABLET BY MOUTH TWICE DAILY AT 9AM AND 9PM   . metoprolol succinate (TOPROL-XL) 50 MG 24 hr tablet Take 1 tablet (50 mg total) by mouth daily. Take with or immediately following a meal.   . nitroGLYCERIN (NITROSTAT) 0.4 MG SL tablet DISSOLVE 1 TABLET SUBLINGUALLY AS NEEDED FOR CHEST PAIN, MAY REPEAT EVERY 5 MINUTES. AFTER 3 CALL 911. 02/11/2021: Patient has on hand if needed.  . NON FORMULARY Diet :mechanical soft diet with ground meats, NAS, cons CHO   . ondansetron (ZOFRAN-ODT) 4 MG disintegrating tablet TAKE (1) TABLET BY MOUTH EVERY EIGHT HOURS AS NEEDED FOR NAUSEA OR VOMITING (Patient taking differently: Take 4 mg by mouth every 8 (eight) hours as needed for nausea or vomiting.)   . OXYGEN Inhale 3 L into the lungs continuous.   . polyethylene glycol (MIRALAX / GLYCOLAX) 17 g packet Take 17 g by mouth daily as needed.   . potassium chloride SA (KLOR-CON) 20 MEQ tablet TAKE ONE TABLET BY MOUTH ONCE DAILY.   . pravastatin (PRAVACHOL) 40 MG tablet TAKE ONE TABLET BY MOUTH ONCE DAILY.   . SYNTHROID 88 MCG tablet TAKE 1 TABLET BEFORE BREAKFAST.   Marland Kitchen UNABLE TO FIND Compression Stockings R-  Ankle  26.5cm   Calf 38cm  Leg  44cm L-  Ankle  27.5 cm   Calf 39cm   Leg  44cm (Patient taking differently: Compression Stockings R-  Ankle  26.5cm   Calf 38cm  Leg  44cm L-  Ankle  27.5 cm   Calf 39cm  Leg  44cm)   . warfarin (COUMADIN) 10 MG tablet TAKE (1) TABLET BY MOUTH DAILY AT 4PM    No facility-administered encounter medications on file as of 02/17/2021.     SIGNIFICANT DIAGNOSTIC EXAMS   PREVIOUS   01-18-21: chest x-ray: Cardiomegaly with mild central vascular congestion. No focal consolidation  01-18-21: renal ultrasound:  1. Mild right hydronephrosis. 2. Right renal parenchymal thinning and increased echogenicity consistent with chronic medical renal disease. 3. Significantly limited evaluation of the left kidney, no gross hydronephrosis  01-21-21: 2-d echo:  Left ventricular ejection fraction, by estimation, is 25 to 30%. The  left ventricle has severely decreased function. The left ventricle  demonstrates global hypokinesis. The left ventricular internal cavity size  was moderately dilated. There is mild  concentric left ventricular hypertrophy. Left ventricular diastolic  parameters are indeterminate.   01-27-21 left elbow x-ray:  Soft tissue swelling. No fracture or dislocation. No appreciable arthropathy. Atherosclerotic arterial vascular calcifications noted.  NO NEW EXAMS.    LABS REVIEWED PREVIOUS   01-18-21: wbc 5.1; hgb 10.5; hct 35.9; mcv 93.0 plt 166; glucose 104; bun 87; creat 2.84; k+ 4.6; na++ 139; ca 9.5 GFR 16; phos 4.6 albumin 3.6; INR 1.6 01-21-21: wbc 7.5; hgb 8.5; hct 27.9; mcv 89.7 plt 157; glucose 105; bun 91; creat 1.93; k+ 4.4; na++ 139; ca 8.9; GFR 25;  01-28-21: wbc 7.5; hgb 8.0; hct 25.8; mcv 90.5 plt 169; glucose 102; bun 89; creat 1.72; k+ 4.5; na++ 136; ca 8.6 GFR 29; mag 2.0; phos 4.0 01-31-21; INR 1.2 (goal 2.5-3.5)  02-07-21: glucose 86; bun 65; creat 1.58; k+ 4.6; na++ 136; ca 8.7 GFR 32; liver normal albumin 3.0  02-10-21: INR 3.6 on 10 mg coumadin   TODAY  02-17-21: INR 4.0 on 10 mg coumadin    Review of  Systems  Constitutional: Negative for malaise/fatigue.  Respiratory: Negative for cough and shortness of breath.   Cardiovascular: Negative for chest pain, palpitations and leg swelling.  Gastrointestinal: Negative for abdominal pain, constipation and heartburn.  Musculoskeletal: Negative for back pain, joint pain and myalgias.       Chronic left arm edema   Skin: Negative.   Neurological: Negative for dizziness.  Psychiatric/Behavioral: The patient is not nervous/anxious.     Physical Exam Constitutional:      General: She is not in acute distress.    Appearance: She is well-developed. She is not diaphoretic.  Neck:     Thyroid: No thyromegaly.  Cardiovascular:     Rate and Rhythm: Normal rate and regular rhythm.     Pulses: Normal pulses.     Heart sounds: Normal heart sounds.     Comments: Pacemaker MVR Pulmonary:     Effort: Pulmonary effort is normal. No respiratory distress.     Breath sounds: Normal breath sounds.     Comments: 02  Abdominal:     General: Bowel sounds are normal. There is no distension.     Palpations: Abdomen is soft.     Tenderness: There is no abdominal tenderness.  Musculoskeletal:        General: Normal range of motion.     Cervical back: Neck supple.     Right lower leg: No edema.     Left lower leg: No edema.     Comments: Left upper extremity edema present   Lymphadenopathy:     Cervical: No cervical adenopathy.  Skin:    General: Skin is warm and dry.  Neurological:     Mental Status: She is alert and oriented to person, place, and time.  Psychiatric:        Mood and Affect: Mood normal.      ASSESSMENT/ PLAN:  TODAY  1. Persistent atrial fibrillation 2. S/p MVR (mitral valve replacement) 3.  Chronic anticoagulation on coumadin for mechanical AVR/MVR  For her INR of 4.0 will hold coumadin today and then resume 10 mg daily will check INR on 02-23-21.    Ok Edwards NP [facility]  Contact [phone] Monday  through Friday 8am- 5pm  After hours call [phone]

## 2021-02-23 ENCOUNTER — Non-Acute Institutional Stay (SKILLED_NURSING_FACILITY): Payer: Medicare Other | Admitting: Adult Health

## 2021-02-23 ENCOUNTER — Other Ambulatory Visit (HOSPITAL_COMMUNITY)
Admission: RE | Admit: 2021-02-23 | Discharge: 2021-02-23 | Disposition: A | Discharge status: Home / Self Care | Payer: Medicare Other | Source: Skilled Nursing Facility | Attending: Adult Health | Admitting: Adult Health

## 2021-02-23 ENCOUNTER — Encounter: Payer: Self-pay | Admitting: Adult Health

## 2021-02-23 DIAGNOSIS — I4811 Longstanding persistent atrial fibrillation: Secondary | ICD-10-CM | POA: Diagnosis present

## 2021-02-23 DIAGNOSIS — Z7901 Long term (current) use of anticoagulants: Secondary | ICD-10-CM

## 2021-02-23 DIAGNOSIS — I4819 Other persistent atrial fibrillation: Secondary | ICD-10-CM | POA: Diagnosis not present

## 2021-02-23 DIAGNOSIS — Z952 Presence of prosthetic heart valve: Secondary | ICD-10-CM

## 2021-02-23 LAB — PROTIME-INR
INR: 3.3 — ABNORMAL HIGH (ref 0.8–1.2)
Prothrombin Time: 33.2 seconds — ABNORMAL HIGH (ref 11.4–15.2)

## 2021-02-23 NOTE — Progress Notes (Signed)
Location:  [place] Room Number: 159-P Place of Service:  SNF (31)   CODE STATUS: Full Code   Allergies  Allergen Reactions  . Penicillins Other (See Comments)    Bruise    . Aspirin Other (See Comments)    On coumadin  . Fentanyl Dermatitis    Rash with patch   . Niacin Itching    Chief Complaint  Patient presents with  . Anticoagulation    PT/INR management     HPI:  She is on long term coumadin therapy for her afib; with valve replacements. Her INR goal is 2.5-3.5. she is presently taking coumadin 10 mg daily with her INR at 3.3. there are no reports of missed doses. There no reports of chest pain; no palpitations; no bleeding present.   Past Medical History:  Diagnosis Date  . Acute on chronic diastolic CHF (congestive heart failure) ([place]) 05/19/2012  . Acute on chronic respiratory failure with hypoxia ([place]) 01/31/2020  . Adenomatous polyp of colon 12/16/2002   Dr. [name]/[facility]  . Allergy   . Calculus of gallbladder with chronic cholecystitis without obstruction 02/09/2009   Qualifier: Diagnosis of  By: [name], [name]    . Cataract   . CHB (complete heart block) ([place]) 10/07/2014      . CHF (congestive heart failure) ([place])    a.  EF previously reduced to 25-30% b. EF improved to 60-65% by echo in 10/2017.   Marland Kitchen Chronic gout due to renal impairment    Per [facility] Records [facility]   . Chronic kidney disease, stage I 2006  . Cognitive communication deficit    Per [facility]   . Constipation       . Convulsion ([place])    Per [facility]   . COPD (chronic obstructive pulmonary disease) ([place])       . DJD (degenerative joint disease) of lumbar spine   . DM (diabetes mellitus) ([place]) 2006  . Dysphagia, unspecified    Per [facility] Electronic  Medical Records System   . Esophageal reflux   . Glaucoma   . Gout       . H/O adenomatous polyp of colon 05/24/2012   2004 in [place] colonoscopy 2008 normal    . H/O heart valve replacement with mechanical valve    a. s/p mechanical AVR in 2001 and mechanical MVR in 2004.  Marland Kitchen H/O wisdom tooth extraction   . HIP PAIN, RIGHT 04/23/2009   Qualifier: Diagnosis of  By: [name], [name]    . Hyperkalemia    Per [facility] Records [facility]   . Hyperlipemia   . Hypertensive heart and kidney disease with HF and with CKD stage I-IV ([place])    Per [facility] Records [facility]   . IDA (iron deficiency anemia)    Parenteral iron/[name]  . Insomnia       . Localized edema    Left arm, Per [facility] Records [facility]   . Long term (current) use of anticoagulants    Per [facility] Records [facility]   . Nausea without vomiting 06/07/2010   Qualifier: Diagnosis of  By: [name], [name]    . Non-ischemic cardiomyopathy ([place])    a. s/p MDT CRTD  .  Obesity, unspecified   . Oxygen deficiency 2014   nocturnal;  . Papilloma of breast 03/06/2012   This was diagnosed as a left breast papilloma by ultrasound characteristics and symptoms in 2010. Because of possible medical risk factors no surgical intervention has been done and it did doing annual followups. The area has been stable by physical examination and mammograms and ultrasound since 2010.   . Presence of cardiac pacemaker    Per [facility] Records [facility]   . Spinal stenosis of lumbar region 02/19/2013  . Spondylolisthesis   . Spondylosis   . Thyroid cancer ([place])    remote thyroidectomy, no recurrence, pt denies in 2016 thyroid cancer  . Type 2 diabetes mellitus with hyperglycemia ([place]) 07/15/2010  . Type 2 diabetes mellitus with hypoglycemia without  coma ([place])    Per [facility] Records [facility]   . Unspecified chronic bronchitis ([place])    Per [facility] Records [facility]   . Unspecified hypothyroidism       . Urinary tract infection, site not specified    Per [facility] Records [facility]   . 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 Vertigo         Past Surgical History:  Procedure Laterality Date  . AORTIC AND MITRAL VALVE REPLACEMENT  2001  . BI-VENTRICULAR IMPLANTABLE CARDIOVERTER DEFIBRILLATOR UPGRADE N/A 01/18/2015   Procedure: BI-VENTRICULAR IMPLANTABLE CARDIOVERTER DEFIBRILLATOR UPGRADE;  Surgeon: Evans Lance, MD;  Location: [facility] CATH LAB;  Service: Cardiovascular;  Laterality: N/A;  . BIV ICD GENERATOR CHANGEOUT N/A 01/30/2019   Procedure: BIV ICD GENERATOR CHANGEOUT;  Surgeon: Evans Lance, MD;  Location: [facility] CV LAB;  Service: Cardiovascular;  Laterality: N/A;  . CARDIAC CATHETERIZATION    . CARDIAC VALVE REPLACEMENT    . CARDIOVERSION N/A 11/13/2016   Procedure: CARDIOVERSION;  Surgeon: Sanda Klein, MD;  Location: [facility] ENDOSCOPY;  Service: Cardiovascular;  Laterality: N/A;  . COLONOSCOPY  06/12/2007   Rourk- Normal rectum/Normal colon  . COLONOSCOPY  12/16/02   small hemorrhoids  . COLONOSCOPY  06/20/2012   Procedure: COLONOSCOPY;  Surgeon: Daneil Dolin, MD;  Location: [facility] ENDO SUITE;  Service: Endoscopy;  Laterality: N/A;  10:45  . defibrillator implanted 2006    . DOPPLER ECHOCARDIOGRAPHY  2012  . DOPPLER ECHOCARDIOGRAPHY  05,06,07,08,09,2011  . ESOPHAGOGASTRODUODENOSCOPY  06/12/2007   Rourk- normal esophagus, small hiatal hernia, otherwise normal stomach, D1, D2  . EYE SURGERY Right 2005  . EYE SURGERY Left 2006  . ICD LEAD REMOVAL Left 02/08/2015   Procedure: ICD LEAD REMOVAL;  Surgeon: Evans Lance, MD;  Location: [facility] OR;  Service: Cardiovascular;  Laterality: Left;  "Will  plan extraction and insertion of a BiV PM"  **Dr. Roxan Hockey backing up case**  . IMPLANTABLE CARDIOVERTER DEFIBRILLATOR (ICD) GENERATOR CHANGE Left 02/08/2015   Procedure: ICD GENERATOR CHANGE;  Surgeon: Evans Lance, MD;  Location: [place];  Service: Cardiovascular;  Laterality: Left;  . INSERT / REPLACE / REMOVE PACEMAKER    . PACEMAKER INSERTION  May 2016  . pacemaker placed  2004  . right breast cyst removed     benign   . right cataract removed     2005  . TEE WITHOUT CARDIOVERSION N/A 11/13/2016   Procedure: TRANSESOPHAGEAL ECHOCARDIOGRAM (TEE);  Surgeon: Sanda Klein, MD;  Location: [facility] ENDOSCOPY;  Service: Cardiovascular;  Laterality: N/A;  . THYROIDECTOMY  Social History   Socioeconomic History  . Marital status: Widowed    Spouse name: Not on file  . Number of children: 0  . Years of education: Not on file  . Highest education level: Not on file  Occupational History  . Occupation: retired    Fish farm manager: RETIRED  Tobacco Use  . Smoking status: Former Smoker    Packs/day: 0.75    Years: 40.00    Pack years: 30.00    Types: Cigarettes    Quit date: 03/08/1987    Years since quitting: 33.9  . Smokeless tobacco: Never Used  Vaping Use  . Vaping Use: Never used  Substance and Sexual Activity  . Alcohol use: No    Alcohol/week: 0.0 standard drinks  . Drug use: No  . Sexual activity: Not Currently  Other Topics Concern  . Not on file  Social History Narrative   From [place] (2004)   Social Determinants of Health   Financial Resource Strain: Low Risk   . Difficulty of Paying Living Expenses: Not hard at all  Food Insecurity: No Food Insecurity  . Worried About Charity fundraiser in the Last Year: Never true  . Ran Out of Food in the Last Year: Never true  Transportation Needs: No Transportation Needs  . Lack of Transportation (Medical): No  . Lack of Transportation (Non-Medical): No  Physical Activity: Inactive  . Days of Exercise per Week: 0 days   . Minutes of Exercise per Session: 0 min  Stress: No Stress Concern Present  . Feeling of Stress : Only a little  Social Connections: Moderately Isolated  . Frequency of Communication with Friends and Family: More than three times a week  . Frequency of Social Gatherings with Friends and Family: Twice a week  . Attends Religious Services: More than 4 times per year  . Active Member of Clubs or Organizations: No  . Attends Archivist Meetings: Never  . Marital Status: Widowed  Intimate Partner Violence: Not on file   Family History  Problem Relation Age of Onset  . Pancreatic cancer Mother   . Heart disease Father   . Heart disease Sister   . Heart attack Sister   . Heart disease Brother   . Diabetes Brother   . Heart disease Brother   . Diabetes Brother   . Hypertension Brother   . Lung cancer Brother       VITAL SIGNS BP 138/77   Pulse 78   Temp 98 F (36.7 C)   Resp 20   Ht 5\' 7"  (1.702 m)   Wt 198 lb 9.6 oz (90.1 kg)   SpO2 97%   BMI 31.11 kg/m   Outpatient Encounter Medications as of 02/23/2021  Medication Sig  . acetaminophen (TYLENOL) 500 MG tablet Take 500 mg by mouth every 6 (six) hours as needed.  Marland Kitchen albuterol (PROVENTIL) (2.5 MG/3ML) 0.083% nebulizer solution Take 2.5 mg by nebulization in the morning and at bedtime.  Marland Kitchen albuterol (VENTOLIN HFA) 108 (90 Base) MCG/ACT inhaler Inhale 1 puff into the lungs every 6 (six) hours as needed for wheezing or shortness of breath.  . allopurinol (ZYLOPRIM) 100 MG tablet TAKE ONE TABLET BY MOUTH ONCE DAILY.  Marland Kitchen Amino Acids-Protein Hydrolys (FEEDING SUPPLEMENT, PRO-STAT SUGAR FREE 64,) LIQD Take 30 mLs by mouth 2 (two) times daily between meals.  Roseanne Kaufman [place]-Castor Oil (VENELEX) OINT Apply topically. topical, Every Shift, Apply to bilateral buttocks and sacrum qshift.  . benzonatate (TESSALON) 100 MG  capsule Take 100 mg by mouth 2 (two) times daily as needed for cough.  . cholecalciferol (VITAMIN D3) 25 MCG (1000  UNIT) tablet Take 1 tablet (1,000 Units total) by mouth in the morning.  . fluticasone (FLONASE) 50 MCG/ACT nasal spray Place 2 sprays into both nostrils daily.  . fluticasone-salmeterol (ADVAIR) 250-50 MCG/ACT AEPB Inhale 1 puff into the lungs in the morning and at bedtime.  . folic acid (FOLVITE) 1 MG tablet TAKE ONE TABLET BY MOUTH ONCE DAILY.  . furosemide (LASIX) 40 MG tablet Take 1 tablet (40 mg total) by mouth daily. Advised to take 40 mg Lasix once a day alternating with 40 mg Lasix twice daily.  Marland Kitchen gabapentin (NEURONTIN) 300 MG capsule TAKE (1) CAPSULE BY MOUTH TWICE DAILY  . hydrALAZINE (APRESOLINE) 10 MG tablet Take 1 tablet (10 mg total) by mouth every 8 (eight) hours.  . isosorbide mononitrate (IMDUR) 30 MG 24 hr tablet Take 0.5 tablets (15 mg total) by mouth daily.  Marland Kitchen levETIRAcetam (KEPPRA) 500 MG tablet TAKE (1) TABLET BY MOUTH TWICE DAILY AT 9AM AND 9PM  . metoprolol succinate (TOPROL-XL) 50 MG 24 hr tablet Take 1 tablet (50 mg total) by mouth daily. Take with or immediately following a meal.  . nitroGLYCERIN (NITROSTAT) 0.4 MG SL tablet DISSOLVE 1 TABLET SUBLINGUALLY AS NEEDED FOR CHEST PAIN, MAY REPEAT EVERY 5 MINUTES. AFTER 3 CALL 911.  . NON FORMULARY Diet :mechanical soft diet with ground meats, NAS, cons CHO  . ondansetron (ZOFRAN-ODT) 4 MG disintegrating tablet TAKE (1) TABLET BY MOUTH EVERY EIGHT HOURS AS NEEDED FOR NAUSEA OR VOMITING  . OXYGEN Inhale 3 L into the lungs continuous.  . polyethylene glycol (MIRALAX / GLYCOLAX) 17 g packet Take 17 g by mouth daily as needed.  . potassium chloride SA (KLOR-CON) 20 MEQ tablet TAKE ONE TABLET BY MOUTH ONCE DAILY.  . pravastatin (PRAVACHOL) 40 MG tablet TAKE ONE TABLET BY MOUTH ONCE DAILY.  . SYNTHROID 88 MCG tablet TAKE 1 TABLET BEFORE BREAKFAST.  Marland Kitchen UNABLE TO FIND Compression Stockings R-  Ankle  26.5cm   Calf 38cm  Leg  44cm L-  Ankle  27.5 cm   Calf 39cm  Leg  44cm (Patient taking differently: Compression Stockings R-  Ankle   26.5cm   Calf 38cm  Leg  44cm L-  Ankle  27.5 cm   Calf 39cm  Leg  44cm)  . warfarin (COUMADIN) 10 MG tablet TAKE (1) TABLET BY MOUTH DAILY AT 4PM   No facility-administered encounter medications on file as of 02/23/2021.     SIGNIFICANT DIAGNOSTIC EXAMS   PREVIOUS   01-18-21: chest x-ray: Cardiomegaly with mild central vascular congestion. No focal consolidation  01-18-21: renal ultrasound:  1. Mild right hydronephrosis. 2. Right renal parenchymal thinning and increased echogenicity consistent with chronic medical renal disease. 3. Significantly limited evaluation of the left kidney, no gross hydronephrosis  01-21-21: 2-d echo:  Left ventricular ejection fraction, by estimation, is 25 to 30%. The  left ventricle has severely decreased function. The left ventricle  demonstrates global hypokinesis. The left ventricular internal cavity size  was moderately dilated. There is mild  concentric left ventricular hypertrophy. Left ventricular diastolic  parameters are indeterminate.   01-27-21 left elbow x-ray:  Soft tissue swelling. No fracture or dislocation. No appreciable arthropathy. Atherosclerotic arterial vascular calcifications noted.  NO NEW EXAMS.    LABS REVIEWED PREVIOUS   01-18-21: wbc 5.1; hgb 10.5; hct 35.9; mcv 93.0 plt 166; glucose 104; bun 87; creat  2.84; k+ 4.6; na++ 139; ca 9.5 GFR 16; phos 4.6 albumin 3.6; INR 1.6 01-21-21: wbc 7.5; hgb 8.5; hct 27.9; mcv 89.7 plt 157; glucose 105; bun 91; creat 1.93; k+ 4.4; na++ 139; ca 8.9; GFR 25;  01-28-21: wbc 7.5; hgb 8.0; hct 25.8; mcv 90.5 plt 169; glucose 102; bun 89; creat 1.72; k+ 4.5; na++ 136; ca 8.6 GFR 29; mag 2.0; phos 4.0 01-31-21; INR 1.2 (goal 2.5-3.5)  02-07-21: glucose 86; bun 65; creat 1.58; k+ 4.6; na++ 136; ca 8.7 GFR 32; liver normal albumin 3.0  02-10-21: INR 3.6 on 10 mg coumadin  02-17-21: INR 4.0 on 10 mg coumadin   TODAY  02-23-21: INR 3.3: on 10 mg coumadin   Review of Systems  Constitutional: Negative for  malaise/fatigue.  Respiratory: Negative for cough and shortness of breath.   Cardiovascular: Negative for chest pain, palpitations and leg swelling.  Gastrointestinal: Negative for abdominal pain, constipation and heartburn.  Musculoskeletal: Negative for back pain, joint pain and myalgias.       Has chronic left upper extremity edema   Skin: Negative.   Neurological: Negative for dizziness.  Psychiatric/Behavioral: The patient is not nervous/anxious.      Physical Exam Constitutional:      General: She is not in acute distress.    Appearance: She is well-developed. She is not diaphoretic.  Neck:     Thyroid: No thyromegaly.  Cardiovascular:     Rate and Rhythm: Normal rate and regular rhythm.     Pulses: Normal pulses.     Heart sounds: Normal heart sounds.     Comments: Pacemaker MVR/AVR Pulmonary:     Effort: Pulmonary effort is normal. No respiratory distress.     Breath sounds: Normal breath sounds.     Comments: 02 Abdominal:     General: Bowel sounds are normal. There is no distension.     Palpations: Abdomen is soft.     Tenderness: There is no abdominal tenderness.  Musculoskeletal:        General: Normal range of motion.     Cervical back: Neck supple.     Right lower leg: No edema.     Left lower leg: No edema.     Comments: Left upper extremity edema present    Lymphadenopathy:     Cervical: No cervical adenopathy.  Skin:    General: Skin is warm and dry.  Neurological:     Mental Status: She is alert and oriented to person, place, and time.  Psychiatric:        Mood and Affect: Mood normal.       ASSESSMENT/ PLAN:  TODAY  1. Persistent atrial fibrillation 2.  S/p MVR (mitral valve replacement) 3. History of aortic valve replacement mechanical 4. Chronic anticoagulation.   For her INR of 3.3 will continue coumadin 10 mg daily and will repeat INR on 03-17-21  Ok Edwards NP [facility]  Contact [phone] Monday through Friday  8am- 5pm  After hours call [phone]

## 2021-02-24 ENCOUNTER — Telehealth: Payer: Medicare Other

## 2021-02-28 ENCOUNTER — Other Ambulatory Visit: Payer: Self-pay | Admitting: *Deleted

## 2021-02-28 NOTE — Patient Outreach (Signed)
[facility] Post- Acute Care Coordinator follow up.   Mrs. Rey resides in [place] [facility]. Update received from SNF SW indicating member will return home on June 16th. Mrs. Krotz will resume aide services through a block grant program through [employer].   Will update [facility] embedded RNCM with PCP office.   Will continue to follow while member resides in SNF.    Marthenia Rolling, MSN, RN,BSN [place] Acute Care Coordinator [phone] [facility]) [phone]  (Toll free office)

## 2021-03-02 ENCOUNTER — Other Ambulatory Visit: Payer: Self-pay | Admitting: Adult Health

## 2021-03-02 ENCOUNTER — Encounter: Payer: Self-pay | Admitting: Adult Health

## 2021-03-02 ENCOUNTER — Non-Acute Institutional Stay (SKILLED_NURSING_FACILITY): Payer: Medicare Other | Admitting: Adult Health

## 2021-03-02 DIAGNOSIS — J9602 Acute respiratory failure with hypercapnia: Secondary | ICD-10-CM

## 2021-03-02 DIAGNOSIS — I4819 Other persistent atrial fibrillation: Secondary | ICD-10-CM

## 2021-03-02 DIAGNOSIS — N184 Chronic kidney disease, stage 4 (severe): Secondary | ICD-10-CM | POA: Diagnosis not present

## 2021-03-02 DIAGNOSIS — I130 Hypertensive heart and chronic kidney disease with heart failure and stage 1 through stage 4 chronic kidney disease, or unspecified chronic kidney disease: Secondary | ICD-10-CM

## 2021-03-02 DIAGNOSIS — J9601 Acute respiratory failure with hypoxia: Secondary | ICD-10-CM | POA: Diagnosis not present

## 2021-03-02 DIAGNOSIS — I714 Abdominal aortic aneurysm, without rupture, unspecified: Secondary | ICD-10-CM

## 2021-03-02 DIAGNOSIS — I5022 Chronic systolic (congestive) heart failure: Secondary | ICD-10-CM | POA: Diagnosis not present

## 2021-03-02 DIAGNOSIS — I5021 Acute systolic (congestive) heart failure: Secondary | ICD-10-CM | POA: Diagnosis not present

## 2021-03-02 MED ORDER — HYDRALAZINE HCL 10 MG PO TABS: 10.0000 mg | ORAL_TABLET | Freq: Three times a day (TID) | ORAL | 0 refills | Status: DC

## 2021-03-02 MED ORDER — ONDANSETRON 4 MG PO TBDP: ORAL_TABLET | ORAL | 0 refills | Status: DC

## 2021-03-02 MED ORDER — SYNTHROID 88 MCG PO TABS: ORAL_TABLET | ORAL | 0 refills | Status: DC

## 2021-03-02 MED ORDER — ALBUTEROL SULFATE (2.5 MG/3ML) 0.083% IN NEBU: 2.5000 mg | INHALATION_SOLUTION | Freq: Two times a day (BID) | RESPIRATORY_TRACT | 0 refills | Status: AC

## 2021-03-02 MED ORDER — FLUTICASONE-SALMETEROL 250-50 MCG/ACT IN AEPB: 1.0000 | INHALATION_SPRAY | Freq: Two times a day (BID) | RESPIRATORY_TRACT | 0 refills | Status: AC

## 2021-03-02 MED ORDER — WARFARIN SODIUM 10 MG PO TABS: ORAL_TABLET | ORAL | 0 refills | Status: DC

## 2021-03-02 MED ORDER — FUROSEMIDE 40 MG PO TABS: 40.0000 mg | ORAL_TABLET | Freq: Every day | ORAL | 0 refills | Status: DC

## 2021-03-02 MED ORDER — ISOSORBIDE MONONITRATE ER 30 MG PO TB24: 15.0000 mg | ORAL_TABLET | Freq: Every day | ORAL | 0 refills | Status: DC

## 2021-03-02 MED ORDER — PRAVASTATIN SODIUM 40 MG PO TABS: 40.0000 mg | ORAL_TABLET | Freq: Every day | ORAL | 0 refills | Status: DC

## 2021-03-02 MED ORDER — ALBUTEROL SULFATE HFA 108 (90 BASE) MCG/ACT IN AERS: 1.0000 | INHALATION_SPRAY | Freq: Four times a day (QID) | RESPIRATORY_TRACT | 0 refills | Status: DC | PRN

## 2021-03-02 MED ORDER — METOPROLOL SUCCINATE ER 50 MG PO TB24: 50.0000 mg | ORAL_TABLET | Freq: Every day | ORAL | 1 refills | Status: DC

## 2021-03-02 MED ORDER — GABAPENTIN 300 MG PO CAPS: ORAL_CAPSULE | ORAL | 0 refills | Status: DC

## 2021-03-02 MED ORDER — BENZONATATE 100 MG PO CAPS: 100.0000 mg | ORAL_CAPSULE | Freq: Two times a day (BID) | ORAL | 0 refills | Status: DC | PRN

## 2021-03-02 MED ORDER — FOLIC ACID 1 MG PO TABS: ORAL_TABLET | ORAL | 0 refills | Status: DC

## 2021-03-02 MED ORDER — POTASSIUM CHLORIDE CRYS ER 20 MEQ PO TBCR: 20.0000 meq | EXTENDED_RELEASE_TABLET | Freq: Every day | ORAL | 0 refills | Status: DC

## 2021-03-02 MED ORDER — LEVETIRACETAM 500 MG PO TABS: ORAL_TABLET | ORAL | 0 refills | Status: DC

## 2021-03-02 MED ORDER — NITROGLYCERIN 0.4 MG SL SUBL: SUBLINGUAL_TABLET | SUBLINGUAL | 0 refills | Status: AC

## 2021-03-02 MED ORDER — ALLOPURINOL 100 MG PO TABS: 100.0000 mg | ORAL_TABLET | Freq: Every day | ORAL | 0 refills | Status: DC

## 2021-03-02 NOTE — Progress Notes (Signed)
[facility] Room Number: 159 Place of Service:  SNF (31)    CODE STATUS: dnr  Allergies  Allergen Reactions  . Penicillins Other (See Comments)    Bruise    . Aspirin Other (See Comments)    On coumadin  . Fentanyl Dermatitis    Rash with patch   . Niacin Itching    Chief Complaint  Patient presents with  . Discharge Note    HPI:  She is being discharged to home with home health for pt/ot. She does not require any dme. She does have care givers at home. She will need INR 03-17-21. She will need her prescriptions written and will need to follow up with her medical provider. She had been hospitalized for acute on chronic heart failure. She was admitted to this facility for short term rehab. She has participated in pt/ot to improve upon her level of independence with her adls; she is now ready to continue therapy on a home health basis.   Past Medical History:  Diagnosis Date  . Acute on chronic diastolic CHF (congestive heart failure) ([place]) 05/19/2012  . Acute on chronic respiratory failure with hypoxia ([place]) 01/31/2020  . Adenomatous polyp of colon 12/16/2002   Dr. [name]/[facility]  . Allergy   . Calculus of gallbladder with chronic cholecystitis without obstruction 02/09/2009   Qualifier: Diagnosis of  By: [name], [name]    . Cataract   . CHB (complete heart block) ([place]) 10/07/2014      . CHF (congestive heart failure) ([place])    a.  EF previously reduced to 25-30% b. EF improved to 60-65% by echo in 10/2017.   Marland Kitchen Chronic gout due to renal impairment    Per [facility] Electronic Medical Records System   . Chronic kidney disease, stage I 2006  . Cognitive communication deficit    Per [facility] Records [facility]   . Constipation       . Convulsion ([place])    Per [facility] Medical Records [facility]   . COPD (chronic  obstructive pulmonary disease) ([place])       . DJD (degenerative joint disease) of lumbar spine   . DM (diabetes mellitus) ([place]) 2006  . Dysphagia, unspecified    Per [facility], [facility] Electronic Medical Records System   . Esophageal reflux   . Glaucoma   . Gout       . H/O adenomatous polyp of colon 05/24/2012   2004 in [place] colonoscopy 2008 normal    . H/O heart valve replacement with mechanical valve    a. s/p mechanical AVR in 2001 and mechanical MVR in 2004.  Marland Kitchen H/O wisdom tooth extraction   . HIP PAIN, RIGHT 04/23/2009   Qualifier: Diagnosis of  By: [name], [name]    . Hyperkalemia    Per [facility] Records [facility]   . Hyperlipemia   . Hypertensive heart and kidney disease with HF and with CKD stage I-IV ([place])    Per [facility] Medical Records [facility]   . IDA (iron deficiency anemia)    Parenteral iron/[name]  . Insomnia       . Localized edema    Left arm, Per [facility] Electronic Medical Records [facility]   . Long term (current) use of anticoagulants    Per [facility],  [facility] Electronic Medical Records System   . Nausea without vomiting 06/07/2010   Qualifier: Diagnosis of  By: [name], [name]    . Non-ischemic cardiomyopathy ([place])    a. s/p MDT CRTD  . Obesity, unspecified   . Oxygen deficiency 2014   nocturnal;  . Papilloma of breast 03/06/2012   This was diagnosed as a left breast papilloma by ultrasound characteristics and symptoms in 2010. Because of possible medical risk factors no surgical intervention has been done and it did doing annual followups. The area has been stable by physical examination and mammograms and ultrasound since 2010.   . Presence of cardiac pacemaker    Per [facility] Electronic Medical Records System   . Spinal stenosis of lumbar region 02/19/2013  . Spondylolisthesis   . Spondylosis   .  Thyroid cancer ([place])    remote thyroidectomy, no recurrence, pt denies in 2016 thyroid cancer  . Type 2 diabetes mellitus with hyperglycemia ([place]) 07/15/2010  . Type 2 diabetes mellitus with hypoglycemia without coma ([place])    Per [facility] Electronic Medical Records System   . Unspecified chronic bronchitis ([place])    Per [facility] Electronic Medical Records [facility]   . Unspecified hypothyroidism       . Urinary tract infection, site not specified    Per [facility] Electronic Medical Records System   . 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 Vertigo         Past Surgical History:  Procedure Laterality Date  . AORTIC AND MITRAL VALVE REPLACEMENT  2001  . BI-VENTRICULAR IMPLANTABLE CARDIOVERTER DEFIBRILLATOR UPGRADE N/A 01/18/2015   Procedure: BI-VENTRICULAR IMPLANTABLE CARDIOVERTER DEFIBRILLATOR UPGRADE;  Surgeon: Evans Lance, MD;  Location: [facility] CATH LAB;  Service: Cardiovascular;  Laterality: N/A;  . BIV ICD GENERATOR CHANGEOUT N/A 01/30/2019   Procedure: BIV ICD GENERATOR CHANGEOUT;  Surgeon: Evans Lance, MD;  Location: [place] CV LAB;  Service: Cardiovascular;  Laterality: N/A;  . CARDIAC CATHETERIZATION    . CARDIAC VALVE REPLACEMENT    . CARDIOVERSION N/A 11/13/2016   Procedure: CARDIOVERSION;  Surgeon: Sanda Klein, MD;  [facility]: [facility] ENDOSCOPY;  Service: Cardiovascular;  Laterality: N/A;  . COLONOSCOPY  06/12/2007   Rourk- Normal rectum/Normal colon  . COLONOSCOPY  12/16/02   small hemorrhoids  . COLONOSCOPY  06/20/2012   Procedure: COLONOSCOPY;  Surgeon: Daneil Dolin, MD;  [facility];  Service: Endoscopy;  Laterality: N/A;  10:45  . defibrillator implanted 2006    . DOPPLER ECHOCARDIOGRAPHY  2012  . DOPPLER ECHOCARDIOGRAPHY  05,06,07,08,09,2011  . ESOPHAGOGASTRODUODENOSCOPY  06/12/2007   Rourk- normal esophagus, small hiatal hernia, otherwise normal stomach, D1, D2  . EYE  SURGERY Right 2005  . EYE SURGERY Left 2006  . ICD LEAD REMOVAL Left 02/08/2015   Procedure: ICD LEAD REMOVAL;  Surgeon: Evans Lance, MD;  Location[facility] OR;  Service: Cardiovascular;  Laterality: Left;  "Will plan extraction and insertion of a BiV PM"  **Dr. Roxan Hockey backing up case**  . IMPLANTABLE CARDIOVERTER DEFIBRILLATOR (ICD) GENERATOR CHANGE Left 02/08/2015   Procedure: ICD GENERATOR CHANGE;  Surgeon: Evans Lance, MD;  Location: [facility];  Service: Cardiovascular;  Laterality: Left;  . INSERT / REPLACE / REMOVE PACEMAKER    . PACEMAKER INSERTION  May 2016  . pacemaker placed  2004  . right breast cyst removed     benign   . right cataract  removed     2005  . TEE WITHOUT CARDIOVERSION N/A 11/13/2016   Procedure: TRANSESOPHAGEAL ECHOCARDIOGRAM (TEE);  Surgeon: Sanda Klein, MD;  Location: [facility] ENDOSCOPY;  Service: Cardiovascular;  Laterality: N/A;  . THYROIDECTOMY      Social History   Socioeconomic History  . Marital status: Widowed    Spouse name: Not on file  . Number of children: 0  . Years of education: Not on file  . Highest education level: Not on file  Occupational History  . Occupation: retired    Fish farm manager: RETIRED  Tobacco Use  . Smoking status: Former Smoker    Packs/day: 0.75    Years: 40.00    Pack years: 30.00    Types: Cigarettes    Quit date: 03/08/1987    Years since quitting: 34.0  . Smokeless tobacco: Never Used  Vaping Use  . Vaping Use: Never used  Substance and Sexual Activity  . Alcohol use: No    Alcohol/week: 0.0 standard drinks  . Drug use: No  . Sexual activity: Not Currently  Other Topics Concern  . Not on file  Social History Narrative   From [place] (2004)   Social Determinants of Health   Financial Resource Strain: Low Risk   . Difficulty of Paying Living Expenses: Not hard at all  Food Insecurity: No Food Insecurity  . Worried About Charity fundraiser in the Last Year: Never true  . Ran Out of Food in the Last Year:  Never true  Transportation Needs: No Transportation Needs  . Lack of Transportation (Medical): No  . Lack of Transportation (Non-Medical): No  Physical Activity: Inactive  . Days of Exercise per Week: 0 days  . Minutes of Exercise per Session: 0 min  Stress: No Stress Concern Present  . Feeling of Stress : Only a little  Social Connections: Moderately Isolated  . Frequency of Communication with Friends and Family: More than three times a week  . Frequency of Social Gatherings with Friends and Family: Twice a week  . Attends Religious Services: More than 4 times per year  . Active Member of Clubs or Organizations: No  . Attends Archivist Meetings: Never  . Marital Status: Widowed  Intimate Partner Violence: Not on file   Family History  Problem Relation Age of Onset  . Pancreatic cancer Mother   . Heart disease Father   . Heart disease Sister   . Heart attack Sister   . Heart disease Brother   . Diabetes Brother   . Heart disease Brother   . Diabetes Brother   . Hypertension Brother   . Lung cancer Brother     VITAL SIGNS BP 138/77   Pulse 72   Temp 98 F (36.7 C)   Resp 18   Ht 5\' 7"  (1.702 m)   Wt 199 lb 9.6 oz (90.5 kg)   SpO2 98%   BMI 31.26 kg/m   Patient's Medications  New Prescriptions   No medications on file  Previous Medications   ACETAMINOPHEN (TYLENOL) 500 MG TABLET    Take 500 mg by mouth every 6 (six) hours as needed.   ALBUTEROL (PROVENTIL) (2.5 MG/3ML) 0.083% NEBULIZER SOLUTION    Take 3 mLs (2.5 mg total) by nebulization in the morning and at bedtime.   ALBUTEROL (VENTOLIN HFA) 108 (90 BASE) MCG/ACT INHALER    Inhale 1 puff into the lungs every 6 (six) hours as needed for wheezing or shortness of breath.   ALLOPURINOL (ZYLOPRIM) 100  MG TABLET    Take 1 tablet (100 mg total) by mouth daily.   AMINO ACIDS-PROTEIN HYDROLYS (FEEDING SUPPLEMENT, PRO-STAT SUGAR FREE 64,) LIQD    Take 30 mLs by mouth 2 (two) times daily between meals.   BALSAM  PERU-CASTOR OIL (VENELEX) OINT    Apply topically. topical, Every Shift, Apply to bilateral buttocks and sacrum qshift.   BENZONATATE (TESSALON) 100 MG CAPSULE    Take 1 capsule (100 mg total) by mouth 2 (two) times daily as needed for cough.   CHOLECALCIFEROL (VITAMIN D3) 25 MCG (1000 UNIT) TABLET    Take 1 tablet (1,000 Units total) by mouth in the morning.   FLUTICASONE (FLONASE) 50 MCG/ACT NASAL SPRAY    Place 2 sprays into both nostrils daily.   FLUTICASONE-SALMETEROL (ADVAIR) 250-50 MCG/ACT AEPB    Inhale 1 puff into the lungs in the morning and at bedtime.   FOLIC ACID (FOLVITE) 1 MG TABLET    TAKE ONE TABLET BY MOUTH ONCE DAILY.   FUROSEMIDE (LASIX) 40 MG TABLET    Take 1 tablet (40 mg total) by mouth daily.   GABAPENTIN (NEURONTIN) 300 MG CAPSULE    TAKE (1) CAPSULE BY MOUTH TWICE DAILY   HYDRALAZINE (APRESOLINE) 10 MG TABLET    Take 1 tablet (10 mg total) by mouth every 8 (eight) hours.   ISOSORBIDE MONONITRATE (IMDUR) 30 MG 24 HR TABLET    Take 0.5 tablets (15 mg total) by mouth daily.   LEVETIRACETAM (KEPPRA) 500 MG TABLET    TAKE (1) TABLET BY MOUTH TWICE DAILY AT 9AM AND 9PM   METOPROLOL SUCCINATE (TOPROL-XL) 50 MG 24 HR TABLET    Take 1 tablet (50 mg total) by mouth daily. Take with or immediately following a meal.   NITROGLYCERIN (NITROSTAT) 0.4 MG SL TABLET    DISSOLVE 1 TABLET SUBLINGUALLY AS NEEDED FOR CHEST PAIN, MAY REPEAT EVERY 5 MINUTES. AFTER 3 CALL 911.   NON FORMULARY    Diet :mechanical soft diet with ground meats, NAS, cons CHO   ONDANSETRON (ZOFRAN-ODT) 4 MG DISINTEGRATING TABLET    TAKE (1) TABLET BY MOUTH EVERY EIGHT HOURS AS NEEDED FOR NAUSEA OR VOMITING   OXYGEN    Inhale 3 L into the lungs continuous.   POLYETHYLENE GLYCOL (MIRALAX / GLYCOLAX) 17 G PACKET    Take 17 g by mouth daily as needed.   POTASSIUM CHLORIDE SA (KLOR-CON) 20 MEQ TABLET    Take 1 tablet (20 mEq total) by mouth daily.   PRAVASTATIN (PRAVACHOL) 40 MG TABLET    Take 1 tablet (40 mg total) by  mouth daily.   SYNTHROID 88 MCG TABLET    TAKE 1 TABLET BEFORE BREAKFAST.   UNABLE TO FIND    Compression Stockings R-  Ankle  26.5cm   Calf 38cm  Leg  44cm L-  Ankle  27.5 cm   Calf 39cm  Leg  44cm   WARFARIN (COUMADIN) 10 MG TABLET    TAKE (1) TABLET BY MOUTH DAILY AT 4PM  Modified Medications   No medications on file  Discontinued Medications   No medications on file     SIGNIFICANT DIAGNOSTIC EXAMS   PREVIOUS   01-18-21: chest x-ray: Cardiomegaly with mild central vascular congestion. No focal consolidation  01-18-21: renal ultrasound:  1. Mild right hydronephrosis. 2. Right renal parenchymal thinning and increased echogenicity consistent with chronic medical renal disease. 3. Significantly limited evaluation of the left kidney, no gross hydronephrosis  01-21-21: 2-d echo:  Left ventricular ejection  fraction, by estimation, is 25 to 30%. The  left ventricle has severely decreased function. The left ventricle  demonstrates global hypokinesis. The left ventricular internal cavity size  was moderately dilated. There is mild  concentric left ventricular hypertrophy. Left ventricular diastolic  parameters are indeterminate.   01-27-21 left elbow x-ray:  Soft tissue swelling. No fracture or dislocation. No appreciable arthropathy. Atherosclerotic arterial vascular calcifications noted.  NO NEW EXAMS.    LABS REVIEWED PREVIOUS   01-18-21: wbc 5.1; hgb 10.5; hct 35.9; mcv 93.0 plt 166; glucose 104; bun 87; creat 2.84; k+ 4.6; na++ 139; ca 9.5 GFR 16; phos 4.6 albumin 3.6; INR 1.6 01-21-21: wbc 7.5; hgb 8.5; hct 27.9; mcv 89.7 plt 157; glucose 105; bun 91; creat 1.93; k+ 4.4; na++ 139; ca 8.9; GFR 25;  01-28-21: wbc 7.5; hgb 8.0; hct 25.8; mcv 90.5 plt 169; glucose 102; bun 89; creat 1.72; k+ 4.5; na++ 136; ca 8.6 GFR 29; mag 2.0; phos 4.0 01-31-21; INR 1.2 (goal 2.5-3.5)  02-07-21: glucose 86; bun 65; creat 1.58; k+ 4.6; na++ 136; ca 8.7 GFR 32; liver normal albumin 3.0  02-10-21: INR 3.6 on  10 mg coumadin  02-17-21: INR 4.0 on 10 mg coumadin  02-23-21: INR 3.3: on 10 mg coumadin  NO NEW LABS.    Review of Systems  Constitutional:  Negative for malaise/fatigue.  Respiratory:  Negative for cough and shortness of breath.   Cardiovascular:  Negative for chest pain, palpitations and leg swelling.  Gastrointestinal:  Negative for abdominal pain, constipation and heartburn.  Musculoskeletal:  Negative for back pain, joint pain and myalgias.  Skin: Negative.   Neurological:  Negative for dizziness.  Psychiatric/Behavioral:  The patient is not nervous/anxious.    Physical Exam Constitutional:      General: She is not in acute distress.    Appearance: She is well-developed. She is not diaphoretic.  Neck:     Thyroid: No thyromegaly.  Cardiovascular:     Rate and Rhythm: Normal rate and regular rhythm.     Heart sounds: Normal heart sounds.     Comments: Pacemaker MVR Pulmonary:     Effort: Pulmonary effort is normal. No respiratory distress.     Breath sounds: Normal breath sounds.     Comments: 02 Abdominal:     General: Bowel sounds are normal. There is no distension.     Palpations: Abdomen is soft.     Tenderness: There is no abdominal tenderness.  Musculoskeletal:        General: Normal range of motion.     Cervical back: Neck supple.     Right lower leg: No edema.     Left lower leg: No edema.     Comments: Left upper extremity edema present    Lymphadenopathy:  Lymphadenopathy:     Cervical: No cervical adenopathy.  Skin:    General: Skin is warm and dry.  Neurological:     Mental Status: She is alert and oriented to person, place, and time.  Psychiatric:        Mood and Affect: Mood normal.     ASSESSMENT/ PLAN:  Patient is being discharged with the following home health services:  pt/ot to evaluate and treat as indicated for gait balance strength adl training.   Patient is being discharged with the following durable medical equipment:  none needed    Patient has been advised to f/u with their PCP in 1-2 weeks to bring them up to date on their rehab stay.  Social services at facility was responsible for arranging this appointment.  Pt was provided with a 30 day supply of prescriptions for medications and refills must be obtained from their PCP.  For controlled substances, a more limited supply may be provided adequate until PCP appointment only.  A 30 day supply of her prescription medications have been sent to [facility]  Time spent with patient: 35 minutes: medications; home health therapy.     Ok Edwards NP [facility]  Contact [phone] Monday through Friday 8am- 5pm  After hours call [phone]

## 2021-03-04 NOTE — Progress Notes (Signed)
No ICM remote transmission received for 02/22/2021 and next ICM transmission scheduled for 03/14/2021.

## 2021-03-13 DIAGNOSIS — J9622 Acute and chronic respiratory failure with hypercapnia: Secondary | ICD-10-CM | POA: Diagnosis not present

## 2021-03-13 DIAGNOSIS — E559 Vitamin D deficiency, unspecified: Secondary | ICD-10-CM | POA: Diagnosis not present

## 2021-03-13 DIAGNOSIS — G40909 Epilepsy, unspecified, not intractable, without status epilepticus: Secondary | ICD-10-CM | POA: Diagnosis not present

## 2021-03-13 DIAGNOSIS — Z87891 Personal history of nicotine dependence: Secondary | ICD-10-CM | POA: Diagnosis not present

## 2021-03-13 DIAGNOSIS — I429 Cardiomyopathy, unspecified: Secondary | ICD-10-CM | POA: Diagnosis not present

## 2021-03-13 DIAGNOSIS — E890 Postprocedural hypothyroidism: Secondary | ICD-10-CM | POA: Diagnosis not present

## 2021-03-13 DIAGNOSIS — J9621 Acute and chronic respiratory failure with hypoxia: Secondary | ICD-10-CM | POA: Diagnosis not present

## 2021-03-13 DIAGNOSIS — Z7901 Long term (current) use of anticoagulants: Secondary | ICD-10-CM | POA: Diagnosis not present

## 2021-03-13 DIAGNOSIS — N179 Acute kidney failure, unspecified: Secondary | ICD-10-CM | POA: Diagnosis not present

## 2021-03-13 DIAGNOSIS — E1142 Type 2 diabetes mellitus with diabetic polyneuropathy: Secondary | ICD-10-CM | POA: Diagnosis not present

## 2021-03-13 DIAGNOSIS — Z95810 Presence of automatic (implantable) cardiac defibrillator: Secondary | ICD-10-CM | POA: Diagnosis not present

## 2021-03-13 DIAGNOSIS — J441 Chronic obstructive pulmonary disease with (acute) exacerbation: Secondary | ICD-10-CM | POA: Diagnosis not present

## 2021-03-13 DIAGNOSIS — I130 Hypertensive heart and chronic kidney disease with heart failure and stage 1 through stage 4 chronic kidney disease, or unspecified chronic kidney disease: Secondary | ICD-10-CM | POA: Diagnosis not present

## 2021-03-13 DIAGNOSIS — M1030 Gout due to renal impairment, unspecified site: Secondary | ICD-10-CM | POA: Diagnosis not present

## 2021-03-13 DIAGNOSIS — M48061 Spinal stenosis, lumbar region without neurogenic claudication: Secondary | ICD-10-CM | POA: Diagnosis not present

## 2021-03-13 DIAGNOSIS — G9341 Metabolic encephalopathy: Secondary | ICD-10-CM | POA: Diagnosis not present

## 2021-03-13 DIAGNOSIS — N184 Chronic kidney disease, stage 4 (severe): Secondary | ICD-10-CM | POA: Diagnosis not present

## 2021-03-13 DIAGNOSIS — E1122 Type 2 diabetes mellitus with diabetic chronic kidney disease: Secondary | ICD-10-CM | POA: Diagnosis not present

## 2021-03-13 DIAGNOSIS — E785 Hyperlipidemia, unspecified: Secondary | ICD-10-CM | POA: Diagnosis not present

## 2021-03-13 DIAGNOSIS — Z9981 Dependence on supplemental oxygen: Secondary | ICD-10-CM | POA: Diagnosis not present

## 2021-03-13 DIAGNOSIS — I5043 Acute on chronic combined systolic (congestive) and diastolic (congestive) heart failure: Secondary | ICD-10-CM | POA: Diagnosis not present

## 2021-03-14 ENCOUNTER — Telehealth: Payer: Self-pay

## 2021-03-14 ENCOUNTER — Ambulatory Visit (INDEPENDENT_AMBULATORY_CARE_PROVIDER_SITE_OTHER): Payer: Medicare Other

## 2021-03-14 DIAGNOSIS — J42 Unspecified chronic bronchitis: Secondary | ICD-10-CM | POA: Diagnosis not present

## 2021-03-14 DIAGNOSIS — Z950 Presence of cardiac pacemaker: Secondary | ICD-10-CM

## 2021-03-14 DIAGNOSIS — I5042 Chronic combined systolic (congestive) and diastolic (congestive) heart failure: Secondary | ICD-10-CM | POA: Diagnosis not present

## 2021-03-14 NOTE — Telephone Encounter (Signed)
[name] [name] called from [place] needs verbal order for INR. Patient has discharged from [facility] to home.  Tina call back # [phone].

## 2021-03-15 ENCOUNTER — Other Ambulatory Visit (HOSPITAL_COMMUNITY): Payer: Medicare Other

## 2021-03-15 ENCOUNTER — Ambulatory Visit (HOSPITAL_COMMUNITY): Payer: Medicare Other

## 2021-03-15 ENCOUNTER — Ambulatory Visit (HOSPITAL_COMMUNITY): Payer: Medicare Other | Admitting: Hematology

## 2021-03-15 ENCOUNTER — Telehealth: Payer: Self-pay

## 2021-03-15 DIAGNOSIS — I130 Hypertensive heart and chronic kidney disease with heart failure and stage 1 through stage 4 chronic kidney disease, or unspecified chronic kidney disease: Secondary | ICD-10-CM | POA: Diagnosis not present

## 2021-03-15 DIAGNOSIS — J441 Chronic obstructive pulmonary disease with (acute) exacerbation: Secondary | ICD-10-CM | POA: Diagnosis not present

## 2021-03-15 DIAGNOSIS — E1122 Type 2 diabetes mellitus with diabetic chronic kidney disease: Secondary | ICD-10-CM | POA: Diagnosis not present

## 2021-03-15 DIAGNOSIS — I5043 Acute on chronic combined systolic (congestive) and diastolic (congestive) heart failure: Secondary | ICD-10-CM | POA: Diagnosis not present

## 2021-03-15 DIAGNOSIS — N179 Acute kidney failure, unspecified: Secondary | ICD-10-CM | POA: Diagnosis not present

## 2021-03-15 DIAGNOSIS — N184 Chronic kidney disease, stage 4 (severe): Secondary | ICD-10-CM | POA: Diagnosis not present

## 2021-03-15 NOTE — Telephone Encounter (Signed)
[name] [name] from [place] returning call. Call back # [phone] press option 2.

## 2021-03-15 NOTE — Telephone Encounter (Signed)
Tina informed.

## 2021-03-15 NOTE — Telephone Encounter (Signed)
Denise informed.  °

## 2021-03-16 NOTE — Progress Notes (Signed)
EPIC Encounter for ICM Monitoring  Patient Name: [name] [name] is a 87 y.o. female Date: 03/16/2021 Primary Care Physican: Fayrene Helper, MD Primary Cardiologist: [place] Electrophysiologist: Santina Evans Pacing:  99.7%  03/16/2021 Weight: 190 lbs           Spoke with patient and reports feeling well at this time. Heart failure questions reviewed. Pt asymptomatic.   Optivol thoracic impedance suggesting normal fluid levels.     Prescribed:  Furosemide 40 mg 1 tablet (40 mg total) by mouth daily.   Potassium 20 mEq take 1 tablet daily.    Labs: 02/07/2021 Creatinine 1.58, BUN 65, Potassium 4.6, Sodium 136, GFR 32 01/28/2021 Creatinine 1.72, BUN 89, Potassium 4.5, Sodium 136, GFR 29  01/27/2021 Creatinine 1.72, BUN 85, Potassium 5.1, Sodium 132, GFR 29  01/26/2021 Creatinine 1.63, BUN 87, Potassium 4.6, Sodium 134, GFR 31  01/25/2021 Creatinine 1.91, BUN 93, Potassium 4.4, Sodium 133, GFR 25  A complete set of results can be found in Results Review.  Recommendations:  No changes and encouraged to call if experiencing any fluid symptoms.   Follow-up plan: ICM clinic phone appointment on 04/18/2021.  91 day device clinic remote transmission 05/13/2021.    EP/Cardiology Office Visits:  06/06/2021 with Dr Sallyanne Kuster.     Copy of ICM check sent to Dr. Lovena Le   3 month ICM trend: 03/13/2021.    1 Year ICM trend:       [name], RN 03/16/2021 12:30 PM

## 2021-03-17 ENCOUNTER — Ambulatory Visit (INDEPENDENT_AMBULATORY_CARE_PROVIDER_SITE_OTHER): Payer: Medicare Other | Admitting: *Deleted

## 2021-03-17 ENCOUNTER — Telehealth: Payer: Self-pay

## 2021-03-17 DIAGNOSIS — Z952 Presence of prosthetic heart valve: Secondary | ICD-10-CM | POA: Diagnosis not present

## 2021-03-17 DIAGNOSIS — I5043 Acute on chronic combined systolic (congestive) and diastolic (congestive) heart failure: Secondary | ICD-10-CM | POA: Diagnosis not present

## 2021-03-17 DIAGNOSIS — E1122 Type 2 diabetes mellitus with diabetic chronic kidney disease: Secondary | ICD-10-CM | POA: Diagnosis not present

## 2021-03-17 DIAGNOSIS — Z5181 Encounter for therapeutic drug level monitoring: Secondary | ICD-10-CM | POA: Diagnosis not present

## 2021-03-17 DIAGNOSIS — J441 Chronic obstructive pulmonary disease with (acute) exacerbation: Secondary | ICD-10-CM | POA: Diagnosis not present

## 2021-03-17 DIAGNOSIS — Z7901 Long term (current) use of anticoagulants: Secondary | ICD-10-CM

## 2021-03-17 DIAGNOSIS — N184 Chronic kidney disease, stage 4 (severe): Secondary | ICD-10-CM | POA: Diagnosis not present

## 2021-03-17 DIAGNOSIS — I130 Hypertensive heart and chronic kidney disease with heart failure and stage 1 through stage 4 chronic kidney disease, or unspecified chronic kidney disease: Secondary | ICD-10-CM | POA: Diagnosis not present

## 2021-03-17 DIAGNOSIS — N179 Acute kidney failure, unspecified: Secondary | ICD-10-CM | POA: Diagnosis not present

## 2021-03-17 LAB — POCT INR: INR: 5.1 — AB (ref 2.0–3.0)

## 2021-03-17 NOTE — Patient Instructions (Signed)
Hold warfarin tonight and tomorrow night then resume 1 tablet  (10mg ) daily except for 1/2 tablet (5mg )  on Monday and Friday. Pt back home from Nursing home and hospital Order given to [place] on 6/28

## 2021-03-17 NOTE — Telephone Encounter (Signed)
Janett Billow from [place] called to let Dr [name] know she called patient's cardiology to get INR order.

## 2021-03-18 DIAGNOSIS — J441 Chronic obstructive pulmonary disease with (acute) exacerbation: Secondary | ICD-10-CM | POA: Diagnosis not present

## 2021-03-18 DIAGNOSIS — N184 Chronic kidney disease, stage 4 (severe): Secondary | ICD-10-CM | POA: Diagnosis not present

## 2021-03-18 DIAGNOSIS — E1122 Type 2 diabetes mellitus with diabetic chronic kidney disease: Secondary | ICD-10-CM | POA: Diagnosis not present

## 2021-03-18 DIAGNOSIS — N179 Acute kidney failure, unspecified: Secondary | ICD-10-CM | POA: Diagnosis not present

## 2021-03-18 DIAGNOSIS — I5043 Acute on chronic combined systolic (congestive) and diastolic (congestive) heart failure: Secondary | ICD-10-CM | POA: Diagnosis not present

## 2021-03-18 DIAGNOSIS — I130 Hypertensive heart and chronic kidney disease with heart failure and stage 1 through stage 4 chronic kidney disease, or unspecified chronic kidney disease: Secondary | ICD-10-CM | POA: Diagnosis not present

## 2021-03-18 NOTE — Telephone Encounter (Signed)
Noted- looks like she had an INR visit at cardiology yesterday

## 2021-03-21 ENCOUNTER — Telehealth: Payer: Self-pay | Admitting: *Deleted

## 2021-03-21 ENCOUNTER — Inpatient Hospital Stay (HOSPITAL_COMMUNITY): Payer: Medicare Other

## 2021-03-21 ENCOUNTER — Other Ambulatory Visit: Payer: Self-pay

## 2021-03-21 ENCOUNTER — Inpatient Hospital Stay (HOSPITAL_COMMUNITY): Payer: Medicare Other | Attending: Physician Assistant | Admitting: Physician Assistant

## 2021-03-21 ENCOUNTER — Encounter (HOSPITAL_COMMUNITY): Payer: Self-pay | Admitting: Physician Assistant

## 2021-03-21 ENCOUNTER — Other Ambulatory Visit (HOSPITAL_COMMUNITY): Payer: Self-pay | Admitting: Physician Assistant

## 2021-03-21 VITALS — BP 147/69 | HR 69 | Temp 98.4°F | Resp 20

## 2021-03-21 DIAGNOSIS — D631 Anemia in chronic kidney disease: Secondary | ICD-10-CM | POA: Diagnosis not present

## 2021-03-21 DIAGNOSIS — I5043 Acute on chronic combined systolic (congestive) and diastolic (congestive) heart failure: Secondary | ICD-10-CM | POA: Diagnosis not present

## 2021-03-21 DIAGNOSIS — T82897A Other specified complication of cardiac prosthetic devices, implants and grafts, initial encounter: Secondary | ICD-10-CM | POA: Diagnosis not present

## 2021-03-21 DIAGNOSIS — D649 Anemia, unspecified: Secondary | ICD-10-CM

## 2021-03-21 DIAGNOSIS — N183 Chronic kidney disease, stage 3 unspecified: Secondary | ICD-10-CM | POA: Diagnosis not present

## 2021-03-21 DIAGNOSIS — J441 Chronic obstructive pulmonary disease with (acute) exacerbation: Secondary | ICD-10-CM | POA: Diagnosis not present

## 2021-03-21 DIAGNOSIS — N184 Chronic kidney disease, stage 4 (severe): Secondary | ICD-10-CM | POA: Diagnosis not present

## 2021-03-21 DIAGNOSIS — I130 Hypertensive heart and chronic kidney disease with heart failure and stage 1 through stage 4 chronic kidney disease, or unspecified chronic kidney disease: Secondary | ICD-10-CM | POA: Diagnosis not present

## 2021-03-21 DIAGNOSIS — N1832 Chronic kidney disease, stage 3b: Secondary | ICD-10-CM | POA: Diagnosis not present

## 2021-03-21 DIAGNOSIS — E611 Iron deficiency: Secondary | ICD-10-CM

## 2021-03-21 DIAGNOSIS — N179 Acute kidney failure, unspecified: Secondary | ICD-10-CM | POA: Diagnosis not present

## 2021-03-21 DIAGNOSIS — E1122 Type 2 diabetes mellitus with diabetic chronic kidney disease: Secondary | ICD-10-CM | POA: Diagnosis not present

## 2021-03-21 LAB — CBC WITH DIFFERENTIAL/PLATELET
Abs Immature Granulocytes: 0.02 10*3/uL (ref 0.00–0.07)
Basophils Absolute: 0 10*3/uL (ref 0.0–0.1)
Basophils Relative: 0 %
Eosinophils Absolute: 0 10*3/uL (ref 0.0–0.5)
Eosinophils Relative: 0 %
HCT: 30.2 % — ABNORMAL LOW (ref 36.0–46.0)
Hemoglobin: 9.1 g/dL — ABNORMAL LOW (ref 12.0–15.0)
Immature Granulocytes: 0 %
Lymphocytes Relative: 16 %
Lymphs Abs: 0.8 10*3/uL (ref 0.7–4.0)
MCH: 27.9 pg (ref 26.0–34.0)
MCHC: 30.1 g/dL (ref 30.0–36.0)
MCV: 92.6 fL (ref 80.0–100.0)
Monocytes Absolute: 0.4 10*3/uL (ref 0.1–1.0)
Monocytes Relative: 7 %
Neutro Abs: 3.7 10*3/uL (ref 1.7–7.7)
Neutrophils Relative %: 77 %
Platelets: 207 10*3/uL (ref 150–400)
RBC: 3.26 MIL/uL — ABNORMAL LOW (ref 3.87–5.11)
RDW: 18.1 % — ABNORMAL HIGH (ref 11.5–15.5)
WBC: 4.9 10*3/uL (ref 4.0–10.5)
nRBC: 0 % (ref 0.0–0.2)

## 2021-03-21 LAB — IRON AND TIBC
Iron: 32 ug/dL (ref 28–170)
Saturation Ratios: 10 % — ABNORMAL LOW (ref 10.4–31.8)
TIBC: 318 ug/dL (ref 250–450)
UIBC: 286 ug/dL

## 2021-03-21 LAB — FERRITIN: Ferritin: 204 ng/mL (ref 11–307)

## 2021-03-21 MED ORDER — EPOETIN ALFA-EPBX 40000 UNIT/ML IJ SOLN
40000.0000 [IU] | Freq: Once | INTRAMUSCULAR | Status: AC
  Administered 2021-03-21: 40000 [IU] via SUBCUTANEOUS

## 2021-03-21 MED ORDER — EPOETIN ALFA-EPBX 40000 UNIT/ML IJ SOLN
INTRAMUSCULAR | Status: AC
  Filled 2021-03-21: qty 1

## 2021-03-21 NOTE — Patient Instructions (Signed)
[facility]  Discharge Instructions: Thank you for choosing [facility] to provide your oncology and hematology care.  If you have a lab appointment with the Cancer Center, please come in thru the Main Entrance and check in at the main information desk.  Wear comfortable clothing and clothing appropriate for easy access to any Portacath or PICC line.   We strive to give you quality time with your provider. You may need to reschedule your appointment if you arrive late (15 or more minutes).  Arriving late affects you and other patients whose appointments are after yours.  Also, if you miss three or more appointments without notifying the office, you may be dismissed from the clinic at the provider's discretion.      For prescription refill requests, have your pharmacy contact our office and allow 72 hours for refills to be completed.    Today you received the following chemotherapy and/or immunotherapy agents Retacrit injection      To help prevent nausea and vomiting after your treatment, we encourage you to take your nausea medication as directed.  BELOW ARE SYMPTOMS THAT SHOULD BE REPORTED IMMEDIATELY: *FEVER GREATER THAN 100.4 F (38 C) OR HIGHER *CHILLS OR SWEATING *NAUSEA AND VOMITING THAT IS NOT CONTROLLED WITH YOUR NAUSEA MEDICATION *UNUSUAL SHORTNESS OF BREATH *UNUSUAL BRUISING OR BLEEDING *URINARY PROBLEMS (pain or burning when urinating, or frequent urination) *BOWEL PROBLEMS (unusual diarrhea, constipation, pain near the anus) TENDERNESS IN MOUTH AND THROAT WITH OR WITHOUT PRESENCE OF ULCERS (sore throat, sores in mouth, or a toothache) UNUSUAL RASH, SWELLING OR PAIN  UNUSUAL VAGINAL DISCHARGE OR ITCHING   Items with * indicate a potential emergency and should be followed up as soon as possible or go to the Emergency Department if any problems should occur.  Please show the CHEMOTHERAPY ALERT CARD or IMMUNOTHERAPY ALERT CARD at check-in to the  Emergency Department and triage nurse.  Should you have questions after your visit or need to cancel or reschedule your appointment, please contact [facility] [phone]  and follow the prompts.  Office hours are 8:00 a.m. to 4:30 p.m. Monday - Friday. Please note that voicemails left after 4:00 p.m. may not be returned until the following business day.  We are closed weekends and major holidays. You have access to a nurse at all times for urgent questions. Please call the main number to the clinic [phone] and follow the prompts.  For any non-urgent questions, you may also contact your provider using MyChart. We now offer e-Visits for anyone 18 and older to request care online for non-urgent symptoms. For details visit [email or web address].   Also download the MyChart app! Go to the app store, search "MyChart", open the app, select [facility], and log in with your MyChart username and password.  Due to Covid, a mask is required upon entering the hospital/clinic. If you do not have a mask, one will be given to you upon arrival. For doctor visits, patients may have 1 support person aged 18 or older with them. For treatment visits, patients cannot have anyone with them due to current Covid guidelines and our immunocompromised population.  

## 2021-03-21 NOTE — Progress Notes (Signed)
[name] [name], [place] [id number]   CLINIC:  Medical Oncology/Hematology  PCP:  Fayrene Helper, MD 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 female returns for routine follow-up of her normocytic anemia, which is secondary to CKD and mild hemolysis in the setting of mechanical mitral valve.  She was last seen in clinic by NP Faythe Casa on 09/28/2020.  At today's visit, the patient reports feeling fair, apart from her chronic leg swelling and pain in her knees.  She denies any hospitalizations, surgeries, or changes in baseline health status since her last visit.  She denies any signs or symptoms of blood loss, no epistaxis, hemoptysis, hematemesis, melena, hematochezia, or hematuria. She reports chronic fatigue (energy about 50%) and chronic shortness of breath with nighttime cough.  No fever, chills, chest pain, nausea, vomiting, abdominal pain.  No current signs or symptoms of blood clots.  She continues to follow with Dr. Theador Hawthorne for her CKD stage III.  She has 50% energy and 100% appetite. She endorses that she is maintaining a stable weight.    REVIEW OF SYSTEMS:  Review of Systems  Constitutional:  Positive for fatigue (50%). Negative for appetite change, chills, diaphoresis, fever and unexpected weight change.  HENT:   Negative for lump/mass and nosebleeds.   Eyes:  Negative for eye problems.  Respiratory:  Positive for shortness of breath (baseline). Negative for cough and hemoptysis.   Cardiovascular:  Positive for leg swelling (chronic). Negative for chest pain and palpitations.  Gastrointestinal:  Negative for abdominal pain, blood in stool, constipation, diarrhea, nausea and vomiting.  Genitourinary:  Negative for hematuria.    Musculoskeletal:  Positive for arthralgias (knee pain).  Skin: Negative.   Neurological:  Negative for dizziness, headaches and light-headedness.  Hematological:  Does not bruise/bleed easily.     PAST MEDICAL/SURGICAL HISTORY:  Past Medical History:  Diagnosis Date   Acute on chronic diastolic CHF (congestive heart failure) ([place]) 05/19/2012   Acute on chronic respiratory failure with hypoxia ([place]) 01/31/2020   Adenomatous polyp of colon 12/16/2002   Dr. [name]/[facility]   Allergy    Calculus of gallbladder with chronic cholecystitis without obstruction 02/09/2009   Qualifier: Diagnosis of  By: [name], Margaret     Cataract    CHB (complete heart block) ([place]) 10/07/2014       CHF (congestive heart failure) ([place])    a.  EF previously reduced to 25-30% b. EF improved to 60-65% by echo in 10/2017.    Chronic gout due to renal impairment    Per [facility] Electronic Medical Records System    Chronic kidney disease, stage I 2006   Cognitive communication deficit    Per [facility] Electronic Medical Records [facility]    Constipation        Convulsion ([place])    Per [facility] Records [facility]    COPD (chronic obstructive pulmonary disease) ([place])        DJD (degenerative joint disease) of lumbar spine    DM (diabetes mellitus) ([place]) 2006   Dysphagia, unspecified    Per [facility] Electronic Medical Records System    Esophageal reflux    Glaucoma  Gout        H/O adenomatous polyp of colon 05/24/2012   2004 in [place] colonoscopy 2008 normal     H/O heart valve replacement with mechanical valve    a. s/p mechanical AVR in 2001 and mechanical MVR in 2004.   H/O wisdom tooth extraction    HIP PAIN, RIGHT 04/23/2009   Qualifier: Diagnosis of  By: [name], Margaret     Hyperkalemia    Per [facility]adie Cleverly, [facility] Electronic Medical Records System     Hyperlipemia    Hypertensive heart and kidney disease with HF and with CKD stage I-IV ([place])    Per [facility] Electronic Medical Records [facility]    IDA (iron deficiency anemia)    Parenteral iron/[name]   Insomnia        Localized edema    Left arm, Per [facility] Electronic Medical Records System    Long term (current) use of anticoagulants    Per [facility], [facility] Electronic Medical Records System    Nausea without vomiting 06/07/2010   Qualifier: Diagnosis of  By: [name], Margaret     Non-ischemic cardiomyopathy [facility])    a. s/p MDT CRTD   Obesity, unspecified    Oxygen deficiency 2014   nocturnal;   Papilloma of breast 03/06/2012   This was diagnosed as a left breast papilloma by ultrasound characteristics and symptoms in 2010. Because of possible medical risk factors no surgical intervention has been done and it did doing annual followups. The area has been stable by physical examination and mammograms and ultrasound since 2010.    Presence of cardiac pacemaker    Per [facility] Electronic Medical Records System    Spinal stenosis of lumbar region 02/19/2013   Spondylolisthesis    Spondylosis    Thyroid cancer ([place])    remote thyroidectomy, no recurrence, pt denies in 2016 thyroid cancer   Type 2 diabetes mellitus with hyperglycemia ([place]) 07/15/2010   Type 2 diabetes mellitus with hypoglycemia without coma ([place])    Per [facility] Electronic Medical Records System    Unspecified chronic bronchitis ([place])    Per [facility] Electronic Medical Records System    Unspecified hypothyroidism        Urinary tract infection, site not specified    Per [facility] Electronic Medical Records System    Varicose veins of lower extremities with other complications    Vertigo        Past Surgical History:  Procedure Laterality Date   AORTIC AND MITRAL  VALVE REPLACEMENT  2001   BI-VENTRICULAR IMPLANTABLE CARDIOVERTER DEFIBRILLATOR UPGRADE N/A 01/18/2015   Procedure: BI-VENTRICULAR IMPLANTABLE CARDIOVERTER DEFIBRILLATOR UPGRADE;  Surgeon: Evans Lance, MD;  Location: [facility] CATH LAB;  Service: Cardiovascular;  Laterality: N/A;   BIV ICD GENERATOR CHANGEOUT N/A 01/30/2019   Procedure: BIV ICD GENERATOR CHANGEOUT;  Surgeon: Evans Lance, MD;  Location: [facility] CV LAB;  Service: Cardiovascular;  Laterality: N/A;   CARDIAC CATHETERIZATION     CARDIAC VALVE REPLACEMENT     CARDIOVERSION N/A 11/13/2016   Procedure: CARDIOVERSION;  Surgeon: Sanda Klein, MD;  Location: [facility] ENDOSCOPY;  Service: Cardiovascular;  Laterality: N/A;   COLONOSCOPY  06/12/2007   [place]- Normal rectum/Normal colon   COLONOSCOPY  12/16/02   small hemorrhoids   COLONOSCOPY  06/20/2012   Procedure: COLONOSCOPY;  Surgeon: Daneil Dolin, MD;  [facility];  Service: Endoscopy;  Laterality: N/A;  10:45   defibrillator implanted 2006     DOPPLER ECHOCARDIOGRAPHY  2012   DOPPLER ECHOCARDIOGRAPHY  05,06,07,08,09,2011   ESOPHAGOGASTRODUODENOSCOPY  06/12/2007   [place]ourk- normal esophagus, small hiatal hernia, otherwise normal stomach, D1, D2   EYE SURGERY Right 2005   EYE SURGERY Left 2006   ICD LEAD REMOVAL Left 02/08/2015   Procedure: ICD LEAD REMOVAL;  Surgeon: Evans Lance, MD;  Location: [facility];  Service: Cardiovascular;  Laterality: Left;  "Will plan extraction and insertion of a BiV PM"  **Dr. Roxan Hockey backing up case**   IMPLANTABLE CARDIOVERTER DEFIBRILLATOR (ICD) GENERATOR CHANGE Left 02/08/2015   Procedure: ICD GENERATOR CHANGE;  Surgeon: Evans Lance, MD;  Location: [facility];  Service: Cardiovascular;  Laterality: Left;   INSERT / REPLACE / REMOVE PACEMAKER     PACEMAKER INSERTION  May 2016   pacemaker placed  2004   right breast cyst removed     benign    right cataract removed     2005   TEE WITHOUT CARDIOVERSION N/A 11/13/2016   Procedure:  TRANSESOPHAGEAL ECHOCARDIOGRAM (TEE);  Surgeon: Sanda Klein, MD;  Location: [facility] ENDOSCOPY;  Service: Cardiovascular;  Laterality: N/A;   THYROIDECTOMY       SOCIAL HISTORY:  Social History   Socioeconomic History   Marital status: Widowed    Spouse name: Not on file   Number of children: 0   Years of education: Not on file   Highest education level: Not on file  Occupational History   Occupation: retired    Fish farm manager: RETIRED  Tobacco Use   Smoking status: Former    Packs/day: 0.75    Years: 40.00    Pack years: 30.00    Types: Cigarettes    Quit date: 03/08/1987    Years since quitting: 34.0   Smokeless tobacco: Never  Vaping Use   Vaping Use: Never used  Substance and Sexual Activity   Alcohol use: No    Alcohol/week: 0.0 standard drinks   Drug use: No   Sexual activity: Not Currently  Other Topics Concern   Not on file  Social History Narrative   From [place] (2004)   Social Determinants of Health   Financial Resource Strain: Low Risk    Difficulty of Paying Living Expenses: Not hard at all  Food Insecurity: No Food Insecurity   Worried About Charity fundraiser in the Last Year: Never true   Arboriculturist in the Last Year: Never true  Transportation Needs: No Transportation Needs   Lack of Transportation (Medical): No   Lack of Transportation (Non-Medical): No  Physical Activity: Inactive   Days of Exercise per Week: 0 days   Minutes of Exercise per Session: 0 min  Stress: No Stress Concern Present   Feeling of Stress : Only a little  Social Connections: Moderately Isolated   Frequency of Communication with Friends and Family: More than three times a week   Frequency of Social Gatherings with Friends and Family: Twice a week   Attends Religious Services: More than 4 times per year   Active Member of [employer] or Organizations: No   Attends Archivist Meetings: Never   Marital Status: Widowed  Human resources officer Violence: Not on file    FAMILY  HISTORY:  Family History  Problem Relation Age of Onset   Pancreatic cancer Mother    Heart disease Father    Heart disease Sister    Heart attack Sister  Heart disease Brother    Diabetes Brother    Heart disease Brother    Diabetes Brother    Hypertension Brother    Lung cancer Brother     CURRENT MEDICATIONS:  Outpatient Encounter Medications as of 03/21/2021  Medication Sig   acetaminophen (TYLENOL) 500 MG tablet Take 500 mg by mouth every 6 (six) hours as needed.   albuterol (PROVENTIL) (2.5 MG/3ML) 0.083% nebulizer solution Take 3 mLs (2.5 mg total) by nebulization in the morning and at bedtime.   albuterol (VENTOLIN HFA) 108 (90 Base) MCG/ACT inhaler Inhale 1 puff into the lungs every 6 (six) hours as needed for wheezing or shortness of breath.   allopurinol (ZYLOPRIM) 100 MG tablet Take 1 tablet (100 mg total) by mouth daily.   Amino Acids-Protein Hydrolys (FEEDING SUPPLEMENT, PRO-STAT SUGAR FREE 64,) LIQD Take 30 mLs by mouth 2 (two) times daily between meals.   Balsam [place]-Castor Oil (VENELEX) OINT Apply topically. topical, Every Shift, Apply to bilateral buttocks and sacrum qshift.   benzonatate (TESSALON) 100 MG capsule Take 1 capsule (100 mg total) by mouth 2 (two) times daily as needed for cough.   cholecalciferol (VITAMIN D3) 25 MCG (1000 UNIT) tablet Take 1 tablet (1,000 Units total) by mouth in the morning.   fluticasone (FLONASE) 50 MCG/ACT nasal spray Place 2 sprays into both nostrils daily.   fluticasone-salmeterol (ADVAIR) 250-50 MCG/ACT AEPB Inhale 1 puff into the lungs in the morning and at bedtime.   folic acid (FOLVITE) 1 MG tablet TAKE ONE TABLET BY MOUTH ONCE DAILY.   furosemide (LASIX) 40 MG tablet Take 1 tablet (40 mg total) by mouth daily.   gabapentin (NEURONTIN) 300 MG capsule TAKE (1) CAPSULE BY MOUTH TWICE DAILY   hydrALAZINE (APRESOLINE) 10 MG tablet Take 1 tablet (10 mg total) by mouth every 8 (eight) hours.   isosorbide mononitrate (IMDUR) 30 MG  24 hr tablet Take 0.5 tablets (15 mg total) by mouth daily.   levETIRAcetam (KEPPRA) 500 MG tablet TAKE (1) TABLET BY MOUTH TWICE DAILY AT 9AM AND 9PM   metoprolol succinate (TOPROL-XL) 50 MG 24 hr tablet Take 1 tablet (50 mg total) by mouth daily. Take with or immediately following a meal.   nitroGLYCERIN (NITROSTAT) 0.4 MG SL tablet DISSOLVE 1 TABLET SUBLINGUALLY AS NEEDED FOR CHEST PAIN, MAY REPEAT EVERY 5 MINUTES. AFTER 3 CALL 911.   NON FORMULARY Diet :mechanical soft diet with ground meats, NAS, cons CHO   ondansetron (ZOFRAN-ODT) 4 MG disintegrating tablet TAKE (1) TABLET BY MOUTH EVERY EIGHT HOURS AS NEEDED FOR NAUSEA OR VOMITING   OXYGEN Inhale 3 L into the lungs continuous.   polyethylene glycol (MIRALAX / GLYCOLAX) 17 g packet Take 17 g by mouth daily as needed.   potassium chloride SA (KLOR-CON) 20 MEQ tablet Take 1 tablet (20 mEq total) by mouth daily.   pravastatin (PRAVACHOL) 40 MG tablet Take 1 tablet (40 mg total) by mouth daily.   SYNTHROID 88 MCG tablet TAKE 1 TABLET BEFORE BREAKFAST.   UNABLE TO FIND Compression Stockings R-  Ankle  26.5cm   Calf 38cm  Leg  44cm L-  Ankle  27.5 cm   Calf 39cm  Leg  44cm (Patient taking differently: Compression Stockings R-  Ankle  26.5cm   Calf 38cm  Leg  44cm L-  Ankle  27.5 cm   Calf 39cm  Leg  44cm)   warfarin (COUMADIN) 10 MG tablet TAKE (1) TABLET BY MOUTH DAILY AT 4PM   No facility-administered encounter  medications on file as of 03/21/2021.    ALLERGIES:  Allergies  Allergen Reactions   Penicillins Other (See Comments)    Bruise     Aspirin Other (See Comments)    On coumadin   Fentanyl Dermatitis    Rash with patch    Niacin Itching     PHYSICAL EXAM:  ECOG PERFORMANCE STATUS: 2 - Symptomatic, <50% confined to bed  There were no vitals filed for this visit. There were no vitals filed for this visit. Physical Exam Constitutional:      Appearance: Normal appearance. She is obese.  HENT:     Head: Normocephalic  and atraumatic.     Mouth/Throat:     Mouth: Mucous membranes are moist.  Eyes:     Extraocular Movements: Extraocular movements intact.     Pupils: Pupils are equal, round, and reactive to light.  Cardiovascular:     Rate and Rhythm: Normal rate and regular rhythm.     Pulses: Normal pulses.     Comments: Mechanical click Pulmonary:     Effort: Pulmonary effort is normal.     Breath sounds: Normal breath sounds.  Abdominal:     General: Bowel sounds are normal.     Palpations: Abdomen is soft.     Tenderness: There is no abdominal tenderness.  Musculoskeletal:        General: No swelling.     Right lower leg: Edema (1+ pretibial edema) present.     Left lower leg: Edema (1+ pretibial edema) present.  Lymphadenopathy:     Cervical: No cervical adenopathy.  Skin:    General: Skin is warm and dry.  Neurological:     General: No focal deficit present.     Mental Status: She is alert and oriented to person, place, and time.  Psychiatric:        Mood and Affect: Mood normal.        Behavior: Behavior normal.     LABORATORY DATA:  I have reviewed the labs as listed.  CBC    Component Value Date/Time   WBC 4.0 02/15/2021 1100   RBC 3.01 (L) 02/15/2021 1100   HGB 8.2 (L) 02/15/2021 1100   HGB 9.6 (L) 01/28/2019 1218   HCT 27.3 (L) 02/15/2021 1100   HCT 29.9 (L) 01/28/2019 1218   PLT 202 02/15/2021 1100   PLT 199 01/28/2019 1218   MCV 90.7 02/15/2021 1100   MCV 90 01/28/2019 1218   MCH 27.2 02/15/2021 1100   MCHC 30.0 02/15/2021 1100   RDW 18.6 (H) 02/15/2021 1100   RDW 17.1 (H) 01/28/2019 1218   LYMPHSABS 0.6 (L) 02/15/2021 1100   LYMPHSABS 0.7 01/28/2019 1218   MONOABS 0.4 02/15/2021 1100   EOSABS 0.0 02/15/2021 1100   EOSABS 0.0 01/28/2019 1218   BASOSABS 0.0 02/15/2021 1100   BASOSABS 0.0 01/28/2019 1218   CMP Latest Ref Rng & Units 02/07/2021 01/28/2021 01/27/2021  Glucose 70 - 99 mg/dL 86 102(H) 93  BUN 8 - 23 mg/dL 65(H) 89(H) 85(H)  Creatinine 0.44 - 1.00  mg/dL 1.58(H) 1.72(H) 1.72(H)  Sodium 135 - 145 mmol/L 136 136 132(L)  Potassium 3.5 - 5.1 mmol/L 4.6 4.5 5.1  Chloride 98 - 111 mmol/L 98 94(L) 90(L)  CO2 22 - 32 mmol/L 32 31 30  Calcium 8.9 - 10.3 mg/dL 8.7(L) 8.6(L) 8.7(L)  Total Protein 6.5 - 8.1 g/dL 6.4(L) - -  Total Bilirubin 0.3 - 1.2 mg/dL 0.6 - -  Alkaline Phos 38 - 126  U/L 52 - -  AST 15 - 41 U/L 29 - -  ALT 0 - 44 U/L 20 - -    DIAGNOSTIC IMAGING:  I have independently reviewed the relevant imaging and discussed with the patient.  ASSESSMENT & PLAN: 1.  Normocytic anemia: - Multifactorial anemia from CKD, mild hemolysis from mechanical mitral/aortic valve and iron deficiency.   - Has been intermittently on Procrit/Retacrit for greater than 4 years - currently receiving Retacrit 40,000 units every 4 weeks - Goal Hgb > 10.0; goal ferritin 100-200 with iron saturation about 30% in the setting of CKD - Labs today (03/21/2021) show Hgb 9.1 with ferritin 204 - PLAN: No IV iron needed at this time.  Retacrit 40,000 units today.  We will increase frequency of Retacrit to every 3 weeks.  Repeat CBC every 3 weeks.  RTC 9 weeks.   2. CKD stage III - Most recent creatinine is 1.58 (GFR 32), overall stable - PLAN: Continue follow-up with nephrology    PLAN SUMMARY & DISPOSITION: - Increase frequency of Retacrit to 40,000 units every 3 weeks - CBC every 3 weeks - RTC in 9 weeks  All questions were answered. The patient knows to call the clinic with any problems, questions or concerns.  Medical decision making: Moderate  Time spent on visit: I spent 20 minutes counseling the patient face to face. The total time spent in the appointment was 30 minutes and more than 50% was on counseling.   Harriett Rush, PA-C  03/21/21 5:23 PM

## 2021-03-21 NOTE — Patient Instructions (Signed)
[place] at [facility] Discharge Instructions  You were seen today by Tarri Abernethy PA-C for your anemia.  Your anemia is primarily related to your chronic kidney disease, but you do also have some mild hemolysis (blood cells breaking apart) due to your mechanical heart valves.  Your blood counts today are stable, but not yet at goal.  We will increase the frequency of your Retacrit injections to every 3 weeks.  LABS: Return in 3 weeks for labs to check your blood counts  MEDICATIONS: Return in 3 weeks for Retacrit injection  FOLLOW-UP APPOINTMENT: Follow-up visit in 9 weeks   Thank you for choosing [place] at [facility] to provide your oncology and hematology care.  To afford each patient quality time with our provider, please arrive at least 15 minutes before your scheduled appointment time.   If you have a lab appointment with the [facility] please come in thru the Main Entrance and check in at the main information desk.  You need to re-schedule your appointment should you arrive 10 or more minutes late.  We strive to give you quality time with our providers, and arriving late affects you and other patients whose appointments are after yours.  Also, if you no show three or more times for appointments you may be dismissed from the clinic at the providers discretion.     Again, thank you for choosing [facility].  Our hope is that these requests will decrease the amount of time that you wait before being seen by our physicians.       _____________________________________________________________  Should you have questions after your visit to [facility], please contact our office at [phone] and follow the prompts.  Our office hours are 8:00 a.m. and 4:30 p.m. Monday - Friday.  Please note that voicemails left after 4:00 p.m. may not be returned until the following business day.  We are closed weekends  and major holidays.  You do have access to a nurse 24-7, just call the main number to the clinic [phone] and do not press any options, hold on the line and a nurse will answer the phone.    For prescription refill requests, have your pharmacy contact our office and allow 72 hours.    Due to Covid, you will need to wear a mask upon entering the hospital. If you do not have a mask, a mask will be given to you at the Main Entrance upon arrival. For doctor visits, patients may have 1 support person age 85 or older with them. For treatment visits, patients can not have anyone with them due to social distancing guidelines and our immunocompromised population.

## 2021-03-21 NOTE — Progress Notes (Signed)
[name] [name] presents today for Retacrit injection per the provider's orders.  Hgb noted to be 9.1.  Stable during administration without incident; injection site WNL; see MAR for injection details.  Patient tolerated procedure well and without incident.  No questions or complaints noted at this time.  Discharge from clinic via wheelchair stable condition.  Alert and oriented X 3.  Follow up with [facility] as scheduled.

## 2021-03-21 NOTE — Chronic Care Management (AMB) (Signed)
  Chronic Care Management   Outreach Note  03/21/2021 Name: Zayden Hahne MRN: [id number] DOB: May 07, 1932  Matie Dimaano is a 87 y.o. year old female who is a primary care patient of Fayrene Helper, MD. I reached out to [name] by phone today in response to a referral sent by Ms. Cyniah Okubo's PCP, Dr. [name].       An unsuccessful telephone outreach was attempted today. The patient was referred to the case management team for assistance with care management and care coordination.   Follow Up Plan: The care management team will reach out to the patient again over the next 1 days.  If patient returns call to provider office, please advise to call [facility] at [phone].  [facility] Management

## 2021-03-22 ENCOUNTER — Ambulatory Visit (INDEPENDENT_AMBULATORY_CARE_PROVIDER_SITE_OTHER): Payer: Medicare Other | Admitting: *Deleted

## 2021-03-22 DIAGNOSIS — N184 Chronic kidney disease, stage 4 (severe): Secondary | ICD-10-CM | POA: Diagnosis not present

## 2021-03-22 DIAGNOSIS — E1122 Type 2 diabetes mellitus with diabetic chronic kidney disease: Secondary | ICD-10-CM | POA: Diagnosis not present

## 2021-03-22 DIAGNOSIS — Z952 Presence of prosthetic heart valve: Secondary | ICD-10-CM

## 2021-03-22 DIAGNOSIS — I130 Hypertensive heart and chronic kidney disease with heart failure and stage 1 through stage 4 chronic kidney disease, or unspecified chronic kidney disease: Secondary | ICD-10-CM | POA: Diagnosis not present

## 2021-03-22 DIAGNOSIS — Z7901 Long term (current) use of anticoagulants: Secondary | ICD-10-CM | POA: Diagnosis not present

## 2021-03-22 DIAGNOSIS — Z5181 Encounter for therapeutic drug level monitoring: Secondary | ICD-10-CM | POA: Diagnosis not present

## 2021-03-22 DIAGNOSIS — J441 Chronic obstructive pulmonary disease with (acute) exacerbation: Secondary | ICD-10-CM | POA: Diagnosis not present

## 2021-03-22 DIAGNOSIS — N179 Acute kidney failure, unspecified: Secondary | ICD-10-CM | POA: Diagnosis not present

## 2021-03-22 DIAGNOSIS — I5043 Acute on chronic combined systolic (congestive) and diastolic (congestive) heart failure: Secondary | ICD-10-CM | POA: Diagnosis not present

## 2021-03-22 LAB — POCT INR: INR: 3.5 — AB (ref 2.0–3.0)

## 2021-03-22 NOTE — Patient Instructions (Signed)
Hold warfarin tonight then decrease dose to 1 tablet  (10mg ) daily except for 1/2 tablet (5mg )  on Monday, Wednesday and Friday. Order given to [facility] Recheck on 03/29/21

## 2021-03-23 DIAGNOSIS — I130 Hypertensive heart and chronic kidney disease with heart failure and stage 1 through stage 4 chronic kidney disease, or unspecified chronic kidney disease: Secondary | ICD-10-CM | POA: Diagnosis not present

## 2021-03-23 DIAGNOSIS — N184 Chronic kidney disease, stage 4 (severe): Secondary | ICD-10-CM | POA: Diagnosis not present

## 2021-03-23 DIAGNOSIS — J441 Chronic obstructive pulmonary disease with (acute) exacerbation: Secondary | ICD-10-CM | POA: Diagnosis not present

## 2021-03-23 DIAGNOSIS — N179 Acute kidney failure, unspecified: Secondary | ICD-10-CM | POA: Diagnosis not present

## 2021-03-23 DIAGNOSIS — I5043 Acute on chronic combined systolic (congestive) and diastolic (congestive) heart failure: Secondary | ICD-10-CM | POA: Diagnosis not present

## 2021-03-23 DIAGNOSIS — E1122 Type 2 diabetes mellitus with diabetic chronic kidney disease: Secondary | ICD-10-CM | POA: Diagnosis not present

## 2021-03-23 NOTE — Chronic Care Management (AMB) (Signed)
  Care Management   Note  03/23/2021 Name: Adanna Zuckerman MRN: [id number] DOB: Feb 06, 1932  Keller Mikels is a 87 y.o. year old female who is a primary care patient of Fayrene Helper, MD and is actively engaged with the care management team. I reached out to [name] by phone today to assist with scheduling a follow up visit with the RN Case Manager  Follow up plan: Telephone appointment with care management team member scheduled for:03/24/2021  [facility] Management

## 2021-03-24 ENCOUNTER — Telehealth: Payer: Medicare Other

## 2021-03-24 ENCOUNTER — Ambulatory Visit (INDEPENDENT_AMBULATORY_CARE_PROVIDER_SITE_OTHER): Payer: Medicare Other | Admitting: *Deleted

## 2021-03-24 ENCOUNTER — Ambulatory Visit: Payer: Medicare Other | Admitting: Family Medicine

## 2021-03-24 DIAGNOSIS — I10 Essential (primary) hypertension: Secondary | ICD-10-CM

## 2021-03-24 DIAGNOSIS — I5022 Chronic systolic (congestive) heart failure: Secondary | ICD-10-CM

## 2021-03-24 NOTE — Chronic Care Management (AMB) (Signed)
Chronic Care Management   CCM RN Visit Note  03/24/2021 Name: [name] [name] MRN: [id number] DOB: [birth year]  Subjective: [name] [name] is a 87 y.o. year old female who is a primary care patient of Fayrene Helper, MD. The care management team was consulted for assistance with disease management and care coordination needs.    Engaged with patient by telephone for follow up visit in response to provider referral for case management and/or care coordination services.   Consent to Services:  The patient was given information about Chronic Care Management services, agreed to services, and gave verbal consent prior to initiation of services.  Please see initial visit note for detailed documentation.   Patient agreed to services and verbal consent obtained.   Assessment: Review of patient past medical history, allergies, medications, health status, including review of consultants reports, laboratory and other test data, was performed as part of comprehensive evaluation and provision of chronic care management services.   SDOH (Social Determinants of Health) assessments and interventions performed:    CCM Care Plan  Allergies  Allergen Reactions   Penicillins Other (See Comments)    Bruise     Aspirin Other (See Comments)    On coumadin   Fentanyl Dermatitis    Rash with patch    Niacin Itching    Outpatient Encounter Medications as of 03/24/2021  Medication Sig   acetaminophen (TYLENOL) 500 MG tablet Take 500 mg by mouth every 6 (six) hours as needed.   albuterol (PROVENTIL) (2.5 MG/3ML) 0.083% nebulizer solution Take 3 mLs (2.5 mg total) by nebulization in the morning and at bedtime.   albuterol (VENTOLIN HFA) 108 (90 Base) MCG/ACT inhaler Inhale 1 puff into the lungs every 6 (six) hours as needed for wheezing or shortness of breath.   allopurinol (ZYLOPRIM) 100 MG tablet Take 1 tablet (100 mg total) by mouth daily.   Amino Acids-Protein Hydrolys (FEEDING SUPPLEMENT,  PRO-STAT SUGAR FREE 64,) LIQD Take 30 mLs by mouth 2 (two) times daily between meals.   benzonatate (TESSALON) 100 MG capsule Take 1 capsule (100 mg total) by mouth 2 (two) times daily as needed for cough.   cholecalciferol (VITAMIN D3) 25 MCG (1000 UNIT) tablet Take 1 tablet (1,000 Units total) by mouth in the morning.   fluticasone (FLONASE) 50 MCG/ACT nasal spray Place 2 sprays into both nostrils daily.   fluticasone-salmeterol (ADVAIR) 250-50 MCG/ACT AEPB Inhale 1 puff into the lungs in the morning and at bedtime.   folic acid (FOLVITE) 1 MG tablet TAKE ONE TABLET BY MOUTH ONCE DAILY.   furosemide (LASIX) 40 MG tablet Take 1 tablet (40 mg total) by mouth daily.   gabapentin (NEURONTIN) 300 MG capsule TAKE (1) CAPSULE BY MOUTH TWICE DAILY   hydrALAZINE (APRESOLINE) 10 MG tablet Take 1 tablet (10 mg total) by mouth every 8 (eight) hours.   isosorbide mononitrate (IMDUR) 30 MG 24 hr tablet Take 0.5 tablets (15 mg total) by mouth daily.   levETIRAcetam (KEPPRA) 500 MG tablet TAKE (1) TABLET BY MOUTH TWICE DAILY AT 9AM AND 9PM   metoprolol succinate (TOPROL-XL) 50 MG 24 hr tablet Take 1 tablet (50 mg total) by mouth daily. Take with or immediately following a meal.   nitroGLYCERIN (NITROSTAT) 0.4 MG SL tablet DISSOLVE 1 TABLET SUBLINGUALLY AS NEEDED FOR CHEST PAIN, MAY REPEAT EVERY 5 MINUTES. AFTER 3 CALL 911.   OXYGEN Inhale 3 L into the lungs continuous.   polyethylene glycol (MIRALAX / GLYCOLAX) 17 g packet Take 17 g by mouth  daily as needed.   potassium chloride SA (KLOR-CON) 20 MEQ tablet Take 1 tablet (20 mEq total) by mouth daily.   pravastatin (PRAVACHOL) 40 MG tablet Take 1 tablet (40 mg total) by mouth daily.   SYNTHROID 88 MCG tablet TAKE 1 TABLET BEFORE BREAKFAST.   UNABLE TO FIND Compression Stockings R-  Ankle  26.5cm   Calf 38cm  Leg  44cm L-  Ankle  27.5 cm   Calf 39cm  Leg  44cm (Patient taking differently: Compression Stockings R-  Ankle  26.5cm   Calf 38cm  Leg  44cm L-  Ankle   27.5 cm   Calf 39cm  Leg  44cm)   warfarin (COUMADIN) 10 MG tablet TAKE (1) TABLET BY MOUTH DAILY AT 4PM   Balsam [place]-Castor Oil (VENELEX) OINT Apply topically. topical, Every Shift, Apply to bilateral buttocks and sacrum qshift. (Patient not taking: Reported on 03/24/2021)   NON FORMULARY Diet :mechanical soft diet with ground meats, NAS, cons CHO (Patient not taking: Reported on 03/24/2021)   ondansetron (ZOFRAN-ODT) 4 MG disintegrating tablet TAKE (1) TABLET BY MOUTH EVERY EIGHT HOURS AS NEEDED FOR NAUSEA OR VOMITING (Patient not taking: Reported on 03/24/2021)   No facility-administered encounter medications on file as of 03/24/2021.    Patient Active Problem List   Diagnosis Date Noted   Protein-calorie malnutrition, moderate ([place]) 02/08/2021   Aortic atherosclerosis ([place]) 01/31/2021   Other idiopathic peripheral autonomic neuropathy 01/31/2021   Anemia due to stage 4 chronic kidney disease ([place]) 01/31/2021   Acute renal failure superimposed on stage 4 chronic kidney disease (HCC)    Acute respiratory failure with hypoxia ([place]) 01/18/2021   Acute respiratory failure with hypoxia and hypercapnia (HCC) 01/18/2021   Gait abnormality 08/24/2020   Chronic constipation 08/09/2020   Acute UTI 08/03/2020   Hypertensive heart and kidney disease with chronic systolic congestive heart failure and stage 4 chronic kidney disease ([place]) 07/15/2020   Hyperlipidemia associated with type 2 diabetes mellitus ([place]) 07/15/2020   Seizure ([place]) 07/07/2020   Goals of care, counseling/discussion    Palliative care by specialist    Idiopathic hypotension    Arthritis of right knee 06/20/2020   Leg swelling 06/16/2020   Generalized osteoarthritis 05/09/2020   Primary osteoarthritis, left shoulder 29/92/4268   Acute systolic CHF (congestive heart failure) ([place]) 03/31/2020   Bilateral lower extremity edema    Right-sided epistaxis 03/28/2020   Acute pain of left shoulder 01/15/2020   AAA (abdominal aortic  aneurysm) without rupture ([place]) 01/14/2020   COVID-19 virus detected 11/23/2019   Stage 3b chronic kidney disease ([place]) 08/04/2019   Dyspnea 05/30/2019   Anemia 05/30/2019   Gout    Glaucoma    GERD (gastroesophageal reflux disease)    Physical debility 04/28/2019   Encounter for examination following treatment at hospital 11/03/2018   Hyponatremia 09/27/2018   H/O mitral valve replacement with mechanical valve 08/02/2018   Chronic right hip pain 04/16/2018   COPD with acute exacerbation ([place]) 11/15/2017   Trochanteric bursitis, right hip 03/07/2017   Acute on chronic combined systolic and diastolic heart failure ([place]) 11/16/2016   Persistent atrial fibrillation ([place])    Nocturnal hypoxia 10/08/2016   Dependence on nocturnal oxygen therapy 10/08/2016   Hematuria 03/20/2016   S/P ICD (internal cardiac defibrillator) procedure 02/08/2015   Presence of cardiac pacemaker 02/08/2015   CHB (complete heart block) ([place]) 10/07/2014   Fatigue 07/20/2014   Absolute anemia 06/24/2014   Osteopenia 02/10/2014   Encounter for therapeutic drug monitoring 10/29/2013  OA (osteoarthritis) of knee 02/19/2013   Chronic pain of right knee 01/02/2013   Hemolytic anemia ([place]) 09/24/2012   High risk medication use 05/24/2012   COPD (chronic obstructive pulmonary disease) ([place]) 05/19/2012   Chronic anticoagulation, on coumadin for Mechanical AVR and MVR 04/01/2012   Cardiorenal syndrome with renal failure 03/22/2012   Hx of aortic valve replacement, mechanical 03/22/2012   S/P MVR (mitral valve replacement) 03/22/2012   Biventricular automatic implantable cardioverter defibrillator in situ 03/22/2012   CKD (chronic kidney disease), stage IV ([place]) 03/22/2012   Warfarin-induced coagulopathy ([place]) 03/21/2012   Iron deficiency 10/04/2011   Allergic rhinitis 02/14/2011   Mitral valve disease 07/15/2010   Sinus node dysfunction ([place]) 07/15/2010   Type 2 diabetes mellitus with hypoglycemia without coma  ([place]) 07/15/2010   VARICOSE VEINS LOWER EXTREMITIES W/OTH COMPS 02/09/2009   Prediabetes 06/15/2008   Hypothyroidism, postsurgical 02/24/2008   Hyperlipidemia LDL goal <70 02/24/2008   Essential hypertension 02/24/2008   Non-ischemic cardiomyopathy with ICD 02/24/2008   Spondylosis 02/24/2008    Conditions to be addressed/monitored:CHF and HTN  Care Plan : Heart Failure (Adult)  Updates made by Kassie Mends, RN since 03/24/2021 12:00 AM     Problem: Symptom Exacerbation (Heart Failure)   Priority: High     Long-Range Goal: Symptom Exacerbation Prevented or Minimized   Start Date: 10/29/2020  Expected End Date: 04/28/2021  This Visit's Progress: On track  Recent Progress: On track  Priority: High  Note:   Current Barriers:  Knowledge deficits related to basic heart failure pathophysiology and self care management as evidenced by needing reinforcement of HF action plan, pt reports she is weighing daily now, with weight 190 pounds (pt thinks this is her last weight, aide not there and pt not absolutely sure), reports "breathing ok", continues to use oxygen at 3L via [place] continuously, reports sometimes her nose gets sore with using the oxygen, hospitalized 4/26 with hypoxia/ respiratory failure, exacerbation CHF and to SNF 01/28/21 before discharging home, currently has home health, pt reports " legs hurt sometimes, varies day to day, but they don't hurt today".  Pt has prefilled packs for medications and reports she has everything. CNA assists patient to apply compression hose each morning as patient cannot independently perform this task Nurse Case Manager Clinical Goal(s):  Over the next 30 days, patient will weigh self daily and record Over the next 90 days, patient will verbalize understanding of Heart Failure Action Plan and when to call doctor Interventions:  Collaboration with Fayrene Helper, MD regarding development and update of comprehensive plan of care as evidenced by  provider attestation and co-signature Inter-disciplinary care team collaboration (see longitudinal plan of care) Reinforced education on low sodium diet Reviewed Heart Failure Action Plan and importance of knowing how you feel each day Discussed importance of daily weight and recording Reviewed role of diuretics in prevention of fluid overload and importance of taking as prescribed Assessed that patient does have compression hose and is wearing daily Reviewed medications with patient and encouraged compliance, timely refills Reviewed upcoming appointments with patient-  Dr. [name] 04/14/21 Continue to work with home health  Reviewed oxygen safety, importance of no petroleum based products near oxygen (no vaseline) Patient Goals/Self-Care Activities Continue to follow heart failure action plan if symptoms flare up Continue weighing and recording, ask CNA to help you know when to call the doctor- call for weight gain of 3 pounds in one day or 5 pounds in one week. Call RN Care Manager  for any questions at [phone] Wear compression hose as directed, take them off at night Take medications as prescribed and call for refills before running out You have follow up appointment with Dr. [name] on 7/21, call doctor if you need a sooner appointment Use [place] Jelly if oxygen tubing is making your nose sore, do not use petroleum based products  Follow Up Plan: Telephone follow up appointment with care management team member scheduled for:   04/21/2021      Care Plan : Hypertension (Adult)  Updates made by Kassie Mends, RN since 03/24/2021 12:00 AM     Problem: Hypertension (Hypertension)   Priority: Medium     Long-Range Goal: Hypertension Monitored   Start Date: 10/29/2020  Expected End Date: 04/28/2021  This Visit's Progress: On track  Recent Progress: On track  Priority: Medium  Note:   Objective:  Last practice recorded BP readings:  BP Readings from Last 3 Encounters:  10/27/20 (!)  153/77  10/26/20 (!) 145/75  09/28/20 133/72  Most recent eGFR/CrCl: No results found for: EGFR  No components found for: CRCL Current Barriers:  Knowledge Deficits related to basic understanding of hypertension pathophysiology and self care management as evidenced by continued reports of elevated blood pressures- pt reports "visiting nurse" has been checking her blood pressure, pt reports she was supposed to see primary care provider today and cancelled due to aide is not there, pt to reschedule and hopefully for next week, pt reports "I'm doing fairly well today", eating well, sleeping well. Unable to perform ADLs independently- has [facility] home care aide that assists with checking blood pressure several times weekly, aide stays with pt 9 am- 1 pm and is not with pt today. Case Manager Clinical Goal(s):  Over the next 60 days, patient will demonstrate improved adherence to prescribed treatment plan for hypertension as evidenced by taking all medications as prescribed, monitoring and recording blood pressure as directed, adhering to low sodium/DASH diet Interventions:  Collaboration with Fayrene Helper, MD regarding development and update of comprehensive plan of care as evidenced by provider attestation and co-signature Inter-disciplinary care team collaboration (see longitudinal plan of care) Reminded patient to call RN care manager with any questions, contact number provided Reviewed medications with patient and discussed importance of compliance Reviewed with patient, providing education and rationale, to monitor blood pressure at least 3 times per week and record (with assistance of CNA), calling PCP for findings outside established parameters, systolic >597, diastolic >41. Reviewed low salt diet and importance of adherence Patient Goals/Self-Care Activities Continue checking blood pressure at least 3 times per week with the assistance of [facility] home aide, visiting nurse write blood pressure  results in a log or diary Call primary care doctor for any readings outside of normal limits systolic >638, diastolic >45 follow a low salt diet- avoid fast food, salty chips, fries, etc. Continue to follow up with your doctor -reschedule appointment with primary care provider missed today Take all medications as prescribed Follow Up Plan: Telephone follow up appointment with care management team member scheduled for: 04/21/2021     Plan:Telephone follow up appointment with care management team member scheduled for:  04/21/2021  Jacqlyn Larsen [facility], BSN RN Case Manager [facility] [phone]

## 2021-03-24 NOTE — Patient Instructions (Signed)
Visit Information  PATIENT GOALS:  Goals Addressed             This Visit's Progress    Track and Manage My Blood Pressure-Hypertension       Timeframe:  Long-Range Goal Priority:  Medium Start Date:      10/29/2020                       Expected End Date:  04/28/2021                     Follow Up Date 04/21/2021   Continue checking blood pressure at least 3 times per week with the assistance of [facility] home aide, visiting nurse write blood pressure results in a log or diary Call primary care doctor for any readings outside of normal limits systolic >009, diastolic >38 follow a low salt diet- avoid fast food, salty chips, fries, etc. Continue to follow up with your doctor -reschedule appointment with primary care provider missed today Take all medications as prescribed   Why is this important?   You won't feel high blood pressure, but it can still hurt your blood vessels.  High blood pressure can cause heart or kidney problems. It can also cause a stroke.  Making lifestyle changes like losing a little weight or eating less salt will help.  Checking your blood pressure at home and at different times of the day can help to control blood pressure.  If the doctor prescribes medicine remember to take it the way the doctor ordered.  Call the office if you cannot afford the medicine or if there are questions about it.          Track and Manage Symptoms-Heart Failure       Timeframe:  Long-Range Goal Priority:  High Start Date:       10/29/2020                      Expected End Date:   04/28/2021                    Follow Up Date 04/21/2021   Continue to follow heart failure action plan if symptoms flare up Continue weighing and recording, ask CNA to help you know when to call the doctor- call for weight gain of 3 pounds in one day or 5 pounds in one week. Call RN Care Manager for any questions at [phone] Wear compression hose as directed, take them off at night Take medications as  prescribed and call for refills before running out You have follow up appointment with Dr. [name] on 7/21, call doctor if you need a sooner appointment Use [place] Jelly if oxygen tubing is making your nose sore, do not use petroleum based products   Why is this important?   You will be able to handle your symptoms better if you keep track of them.  Making some simple changes to your lifestyle will help.  Eating healthy is one thing you can do to take good care of yourself.             The patient verbalized understanding of instructions, educational materials, and care plan provided today and declined offer to receive copy of patient instructions, educational materials, and care plan.   Telephone follow up appointment with care management team member scheduled for:  7/28/2022Home Oxygen Use, Adult When a medical condition keeps you from getting enough oxygen, your health care  provider may instruct you to take extra oxygen at home. Your health care provider will let you know: When to take oxygen. How long to take oxygen. How quickly oxygen should be delivered (flow rate), in liters per minute (LPM or L/M). Home oxygen can be given through: A mask. A nasal cannula. This is a device or tube that goes in the nostrils. A transtracheal catheter. This is a small, thin tube placed in the windpipe (trachea). A breathing tube (tracheostomy tube) that is surgically placed in the windpipe. This may be used in severe cases. These devices are connected with tubing to an oxygen source, such as: A tank. Tanks hold oxygen in gas form. They must be replaced when the oxygen is used up. A liquid oxygen device. This holds oxygen in liquid form. Liquid oxygen is very cold. It must be replaced when the oxygen is used up. An oxygen concentrator machine. This filters oxygen in the room. There are two types of oxygen concentrator machines--stationary and portable. A stationary oxygen concentrator machine plugs  into the main electricity supply at your home. You must have a backup cylinder of oxygen in case the power goes out. A portable oxygen concentrator machine is smaller in size and more lightweight. This machine uses battery supply and can be used outside the home. Work with your health care provider to find equipment that works best for [employer]. What are the risks? Delivery of supplemental oxygen is generally safe. However, some risks include: Fire. This can happen if the oxygen is exposed to a heat source, flame, or spark. Injury to skin. This can happen if liquid oxygen touches your skin. Damage to the lungs or other organs. This can happen from getting too little or too much oxygen. Supplies needed: To use oxygen, you will need: A mask, nasal cannula, transtracheal catheter, or tracheostomy. An oxygen tank, a liquid oxygen device, or an oxygen concentrator. The tape that your health care provider recommends (optional). Your health care provider may also recommend: A humidifier to warm and moisten the oxygen delivered. This will depend on how much oxygen you need and the type of home oxygen device you use. A pulse oximeter. This device measures the percentage of oxygen in your blood. How to use oxygen Your health care provider or a person from your [place] will show you how to use your oxygen device. Follow his or her instructions. The instructions may look something like this: Wash your hands with soap and water. If you use an oxygen concentrator, make sure it is plugged in. Place one end of the tube into the port on the tank, device, or machine. Place the mask over your nose and mouth. Or, place the nasal cannula and secure it with tape if instructed. If you use a tracheostomy or transtracheal catheter, connect it to the oxygen source as directed. Make sure the liter-flow setting on the machine is at the level prescribed by your health care provider. Turn on the  machine or adjust the knob on the tank or device to the correct liter-flow setting. When you are done, turn off and unplug the machine, or turn the knob to OFF. How to clean and care for the oxygen supplies Nasal cannula Clean it with a warm, wet cloth daily or as needed. Wash it with a liquid soap once a week. Rinse it thoroughly once or twice a week. Air-dry it. Replace it every 2-4 weeks. If you have an infection, such as a cold  or pneumonia, change the cannula when you get better. Mask Replace it every 2-4 weeks. If you have an infection, such as a cold or pneumonia, change the mask when you get better. Humidifier bottle Wash the bottle between each refill: Wash it with soap and warm water. Rinse it thoroughly. Clean it and its top with a disinfectant cleaner. Air-dry it. Make sure it is dry before you refill it. Oxygen concentrator Clean the air filter at least twice a week according to directions from your home medical equipment and service company. Wipe down the cabinet every day. To do this: Unplug the unit. Wipe down the cabinet with a damp cloth. Dry the cabinet. Other equipment Change any extra tubing every 1-3 months. Follow instructions from your health care provider about taking care of any other equipment. Safety tips Fire safety tips  Keep your oxygen and oxygen supplies at least 6 ft (2 m) away from sources of heat, flames, and sparks at all times. Do not allow smoking near your oxygen. Put up "no smoking" signs in your home. Avoid smoking areas when in public. Do not use materials that can burn (are flammable) while you use oxygen. This includes: Petroleum jelly. Hair spray or other aerosol sprays. Rubbing alcohol. Hand sanitizer. When you go to a restaurant with portable oxygen, ask to be seated in the non-smoking section. Keep a Data processing manager close by. Let your fire department know that you have oxygen in your home. Test your home smoke detectors  regularly.  Traveling Secure your oxygen tank in the vehicle so that it does not move around. Follow instructions from your medical device company about how to safely secure your tank. Make sure you have enough oxygen for the amount of time you will be away from home. If you are planning to travel by public transportation (airplane, train, bus, or boat), contact the company to find out if it allows the use of an approved portable oxygen concentrator. You may also need documents from your health care provider and medical device company before you travel. General safety tips If you use an oxygen cylinder, make sure it is in a stand or secured to an object that will not move (fixed object). If you use liquid oxygen, make sure its container is kept upright at all times. If you use an oxygen concentrator: Tell Loss adjuster, chartered company. Make sure you are given priority service in the event that your power goes out. Avoid using extension cords if possible. Follow these instructions at home: Use oxygen only as told by your health care provider. Do not use alcohol or other drugs that make you relax (sedating drugs) unless instructed. They can slow down your breathing rate and make it hard to get in enough oxygen. Know how and when to order a refill of oxygen. Always keep a spare tank of oxygen. Plan ahead for holidays when you may not be able to get a prescription filled. Use water-based lubricants on your lips or nostrils. Do not use oil-based products like petroleum jelly. To prevent skin irritation on your cheeks or behind your ears, tuck some gauze under the tubing. Where to find more information American Lung Association: DiabeticMale.de Contact a health care provider if: You get headaches often. You have a lasting cough. You are restless or have anxiety. You develop an illness that affects your breathing. You cannot exercise at your regular level. You have a fever. You have persistent  redness under your nose. Get help right away if: You  are confused. You are sleepy all the time. You have blue lips or fingernails. You have difficult or irregular breathing that is getting worse. You are struggling to breathe. These symptoms may represent a serious problem that is an emergency. Do not wait to see if the symptoms will go away. Get medical help right away. Call your local emergency services (911 in the [place] Do not drive yourself to the hospital. Summary Your health care provider or a person from your [place] will show you how to use your oxygen device. Follow his or her instructions. If you use an oxygen concentrator, make sure it is plugged in. Make sure the liter-flow setting on the machine is at the level prescribed by your health care provider. Use oxygen only as told by your health care provider. Keep your oxygen and oxygen supplies at least 6 ft (2 m) away from sources of heat, flames, and sparks at all times. This information is not intended to replace advice given to you by your health care provider. Make sure you discuss any questions you have with your healthcare provider. Document Revised: 11/13/2019 Document Reviewed: 09/09/2019 Elsevier Patient Education  2022 [facility], BSN RN Case Advertising copywriter Primary Care [phone]

## 2021-03-25 DIAGNOSIS — E1122 Type 2 diabetes mellitus with diabetic chronic kidney disease: Secondary | ICD-10-CM | POA: Diagnosis not present

## 2021-03-25 DIAGNOSIS — J441 Chronic obstructive pulmonary disease with (acute) exacerbation: Secondary | ICD-10-CM | POA: Diagnosis not present

## 2021-03-25 DIAGNOSIS — I5043 Acute on chronic combined systolic (congestive) and diastolic (congestive) heart failure: Secondary | ICD-10-CM | POA: Diagnosis not present

## 2021-03-25 DIAGNOSIS — N179 Acute kidney failure, unspecified: Secondary | ICD-10-CM | POA: Diagnosis not present

## 2021-03-25 DIAGNOSIS — I130 Hypertensive heart and chronic kidney disease with heart failure and stage 1 through stage 4 chronic kidney disease, or unspecified chronic kidney disease: Secondary | ICD-10-CM | POA: Diagnosis not present

## 2021-03-25 DIAGNOSIS — N184 Chronic kidney disease, stage 4 (severe): Secondary | ICD-10-CM | POA: Diagnosis not present

## 2021-03-25 IMAGING — DX DG CHEST 1V PORT
1 series · 1 of 1 positions shown · non-contrast
Comparison: 03/30/2020

CLINICAL DATA: Cough and CHF

EXAM:
PORTABLE CHEST 1 VIEW

[chest ap]
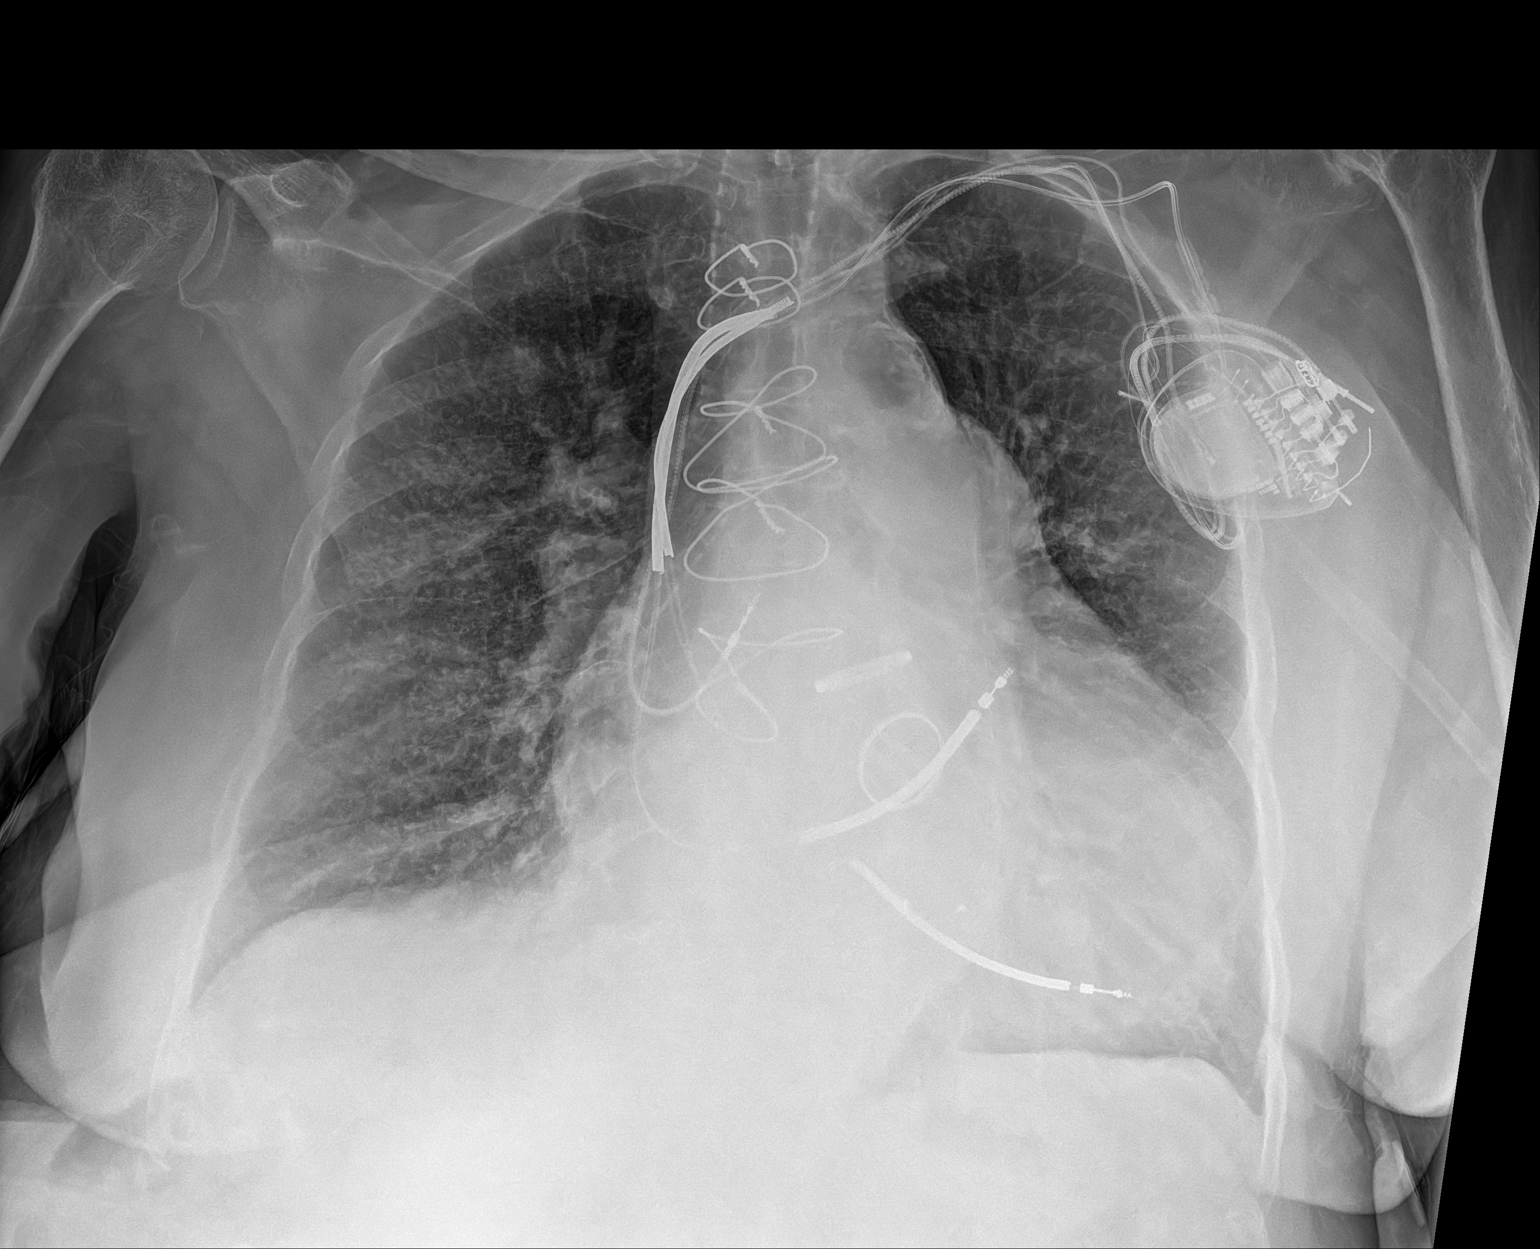

[1 of 1 positions shown; findings below may reference images not displayed]

FINDINGS: Cardiomegaly with biventricular AICD. Remote median sternotomy and
valve replacement. Mild interstitial opacity without overt pulmonary
edema. No pleural effusion or pneumothorax
IMPRESSION: Cardiomegaly without overt pulmonary edema.

## 2021-03-29 DIAGNOSIS — E1122 Type 2 diabetes mellitus with diabetic chronic kidney disease: Secondary | ICD-10-CM | POA: Diagnosis not present

## 2021-03-29 DIAGNOSIS — I5043 Acute on chronic combined systolic (congestive) and diastolic (congestive) heart failure: Secondary | ICD-10-CM | POA: Diagnosis not present

## 2021-03-29 DIAGNOSIS — N184 Chronic kidney disease, stage 4 (severe): Secondary | ICD-10-CM | POA: Diagnosis not present

## 2021-03-29 DIAGNOSIS — N179 Acute kidney failure, unspecified: Secondary | ICD-10-CM | POA: Diagnosis not present

## 2021-03-29 DIAGNOSIS — J441 Chronic obstructive pulmonary disease with (acute) exacerbation: Secondary | ICD-10-CM | POA: Diagnosis not present

## 2021-03-29 DIAGNOSIS — I130 Hypertensive heart and chronic kidney disease with heart failure and stage 1 through stage 4 chronic kidney disease, or unspecified chronic kidney disease: Secondary | ICD-10-CM | POA: Diagnosis not present

## 2021-03-30 ENCOUNTER — Ambulatory Visit (INDEPENDENT_AMBULATORY_CARE_PROVIDER_SITE_OTHER): Payer: Medicare Other | Admitting: *Deleted

## 2021-03-30 ENCOUNTER — Telehealth: Payer: Self-pay | Admitting: *Deleted

## 2021-03-30 DIAGNOSIS — Z5181 Encounter for therapeutic drug level monitoring: Secondary | ICD-10-CM | POA: Diagnosis not present

## 2021-03-30 DIAGNOSIS — E1122 Type 2 diabetes mellitus with diabetic chronic kidney disease: Secondary | ICD-10-CM | POA: Diagnosis not present

## 2021-03-30 DIAGNOSIS — Z7901 Long term (current) use of anticoagulants: Secondary | ICD-10-CM

## 2021-03-30 DIAGNOSIS — I130 Hypertensive heart and chronic kidney disease with heart failure and stage 1 through stage 4 chronic kidney disease, or unspecified chronic kidney disease: Secondary | ICD-10-CM | POA: Diagnosis not present

## 2021-03-30 DIAGNOSIS — N184 Chronic kidney disease, stage 4 (severe): Secondary | ICD-10-CM | POA: Diagnosis not present

## 2021-03-30 DIAGNOSIS — Z952 Presence of prosthetic heart valve: Secondary | ICD-10-CM

## 2021-03-30 DIAGNOSIS — N179 Acute kidney failure, unspecified: Secondary | ICD-10-CM | POA: Diagnosis not present

## 2021-03-30 DIAGNOSIS — G9341 Metabolic encephalopathy: Secondary | ICD-10-CM | POA: Diagnosis not present

## 2021-03-30 DIAGNOSIS — I5043 Acute on chronic combined systolic (congestive) and diastolic (congestive) heart failure: Secondary | ICD-10-CM | POA: Diagnosis not present

## 2021-03-30 DIAGNOSIS — J9621 Acute and chronic respiratory failure with hypoxia: Secondary | ICD-10-CM | POA: Diagnosis not present

## 2021-03-30 DIAGNOSIS — E1142 Type 2 diabetes mellitus with diabetic polyneuropathy: Secondary | ICD-10-CM | POA: Diagnosis not present

## 2021-03-30 DIAGNOSIS — J9622 Acute and chronic respiratory failure with hypercapnia: Secondary | ICD-10-CM | POA: Diagnosis not present

## 2021-03-30 DIAGNOSIS — J441 Chronic obstructive pulmonary disease with (acute) exacerbation: Secondary | ICD-10-CM | POA: Diagnosis not present

## 2021-03-30 LAB — POCT INR: INR: 2.2 (ref 2.0–3.0)

## 2021-03-30 NOTE — Telephone Encounter (Signed)
Spoke with [name] [name].  Warfarin instructions given.  See warfarin note.

## 2021-03-30 NOTE — Telephone Encounter (Signed)
INR 2.2 PT 25.9  Per Janett Billow [phone]

## 2021-03-30 NOTE — Patient Instructions (Signed)
Continue warfarin 1 tablet  (10mg ) daily except for 1/2 tablet (5mg )  on Monday, Wednesday and Friday. Order given to [place] in 1 wk

## 2021-03-31 ENCOUNTER — Telehealth: Payer: Self-pay

## 2021-03-31 DIAGNOSIS — E1122 Type 2 diabetes mellitus with diabetic chronic kidney disease: Secondary | ICD-10-CM | POA: Diagnosis not present

## 2021-03-31 DIAGNOSIS — N184 Chronic kidney disease, stage 4 (severe): Secondary | ICD-10-CM | POA: Diagnosis not present

## 2021-03-31 DIAGNOSIS — N179 Acute kidney failure, unspecified: Secondary | ICD-10-CM | POA: Diagnosis not present

## 2021-03-31 DIAGNOSIS — I130 Hypertensive heart and chronic kidney disease with heart failure and stage 1 through stage 4 chronic kidney disease, or unspecified chronic kidney disease: Secondary | ICD-10-CM | POA: Diagnosis not present

## 2021-03-31 DIAGNOSIS — I5043 Acute on chronic combined systolic (congestive) and diastolic (congestive) heart failure: Secondary | ICD-10-CM | POA: Diagnosis not present

## 2021-03-31 DIAGNOSIS — J441 Chronic obstructive pulmonary disease with (acute) exacerbation: Secondary | ICD-10-CM | POA: Diagnosis not present

## 2021-03-31 NOTE — Telephone Encounter (Signed)
Pt and caregiver informed.

## 2021-03-31 NOTE — Telephone Encounter (Signed)
Pt called and states that her legs started to swell yesterday 03/30/21 so much that one started weeping, she has been using ice to soothe the area and that is helping. Pt wants to know if she could increase her furosemide?

## 2021-04-01 DIAGNOSIS — J441 Chronic obstructive pulmonary disease with (acute) exacerbation: Secondary | ICD-10-CM | POA: Diagnosis not present

## 2021-04-01 DIAGNOSIS — N179 Acute kidney failure, unspecified: Secondary | ICD-10-CM | POA: Diagnosis not present

## 2021-04-01 DIAGNOSIS — E1122 Type 2 diabetes mellitus with diabetic chronic kidney disease: Secondary | ICD-10-CM | POA: Diagnosis not present

## 2021-04-01 DIAGNOSIS — I130 Hypertensive heart and chronic kidney disease with heart failure and stage 1 through stage 4 chronic kidney disease, or unspecified chronic kidney disease: Secondary | ICD-10-CM | POA: Diagnosis not present

## 2021-04-01 DIAGNOSIS — I5043 Acute on chronic combined systolic (congestive) and diastolic (congestive) heart failure: Secondary | ICD-10-CM | POA: Diagnosis not present

## 2021-04-01 DIAGNOSIS — N184 Chronic kidney disease, stage 4 (severe): Secondary | ICD-10-CM | POA: Diagnosis not present

## 2021-04-02 ENCOUNTER — Emergency Department (HOSPITAL_COMMUNITY): Payer: Medicare Other

## 2021-04-02 ENCOUNTER — Emergency Department (HOSPITAL_COMMUNITY)
Admission: EM | Admit: 2021-04-02 | Discharge: 2021-04-03 | Disposition: A | Discharge status: Home / Self Care | Payer: Medicare Other | Attending: Emergency Medicine | Admitting: Emergency Medicine

## 2021-04-02 ENCOUNTER — Encounter (HOSPITAL_COMMUNITY): Payer: Self-pay | Admitting: *Deleted

## 2021-04-02 ENCOUNTER — Other Ambulatory Visit: Payer: Self-pay

## 2021-04-02 DIAGNOSIS — I5041 Acute combined systolic (congestive) and diastolic (congestive) heart failure: Secondary | ICD-10-CM | POA: Diagnosis not present

## 2021-04-02 DIAGNOSIS — Z7901 Long term (current) use of anticoagulants: Secondary | ICD-10-CM | POA: Diagnosis not present

## 2021-04-02 DIAGNOSIS — R0602 Shortness of breath: Secondary | ICD-10-CM | POA: Diagnosis not present

## 2021-04-02 DIAGNOSIS — R069 Unspecified abnormalities of breathing: Secondary | ICD-10-CM | POA: Diagnosis not present

## 2021-04-02 DIAGNOSIS — Z20822 Contact with and (suspected) exposure to covid-19: Secondary | ICD-10-CM | POA: Insufficient documentation

## 2021-04-02 DIAGNOSIS — E1122 Type 2 diabetes mellitus with diabetic chronic kidney disease: Secondary | ICD-10-CM | POA: Diagnosis not present

## 2021-04-02 DIAGNOSIS — N184 Chronic kidney disease, stage 4 (severe): Secondary | ICD-10-CM | POA: Diagnosis not present

## 2021-04-02 DIAGNOSIS — J441 Chronic obstructive pulmonary disease with (acute) exacerbation: Secondary | ICD-10-CM | POA: Diagnosis not present

## 2021-04-02 DIAGNOSIS — Z7952 Long term (current) use of systemic steroids: Secondary | ICD-10-CM | POA: Insufficient documentation

## 2021-04-02 DIAGNOSIS — Z79899 Other long term (current) drug therapy: Secondary | ICD-10-CM | POA: Insufficient documentation

## 2021-04-02 DIAGNOSIS — I509 Heart failure, unspecified: Secondary | ICD-10-CM | POA: Diagnosis not present

## 2021-04-02 DIAGNOSIS — Z87891 Personal history of nicotine dependence: Secondary | ICD-10-CM | POA: Insufficient documentation

## 2021-04-02 DIAGNOSIS — Z8616 Personal history of COVID-19: Secondary | ICD-10-CM | POA: Diagnosis not present

## 2021-04-02 DIAGNOSIS — I10 Essential (primary) hypertension: Secondary | ICD-10-CM | POA: Diagnosis not present

## 2021-04-02 DIAGNOSIS — I110 Hypertensive heart disease with heart failure: Secondary | ICD-10-CM | POA: Diagnosis not present

## 2021-04-02 DIAGNOSIS — I130 Hypertensive heart and chronic kidney disease with heart failure and stage 1 through stage 4 chronic kidney disease, or unspecified chronic kidney disease: Secondary | ICD-10-CM | POA: Diagnosis not present

## 2021-04-02 DIAGNOSIS — Z950 Presence of cardiac pacemaker: Secondary | ICD-10-CM | POA: Insufficient documentation

## 2021-04-02 DIAGNOSIS — R059 Cough, unspecified: Secondary | ICD-10-CM | POA: Diagnosis not present

## 2021-04-02 LAB — CBC WITH DIFFERENTIAL/PLATELET
Abs Immature Granulocytes: 0.02 10*3/uL (ref 0.00–0.07)
Basophils Absolute: 0 10*3/uL (ref 0.0–0.1)
Basophils Relative: 0 %
Eosinophils Absolute: 0 10*3/uL (ref 0.0–0.5)
Eosinophils Relative: 0 %
HCT: 28.6 % — ABNORMAL LOW (ref 36.0–46.0)
Hemoglobin: 8.6 g/dL — ABNORMAL LOW (ref 12.0–15.0)
Immature Granulocytes: 0 %
Lymphocytes Relative: 12 %
Lymphs Abs: 0.7 10*3/uL (ref 0.7–4.0)
MCH: 28 pg (ref 26.0–34.0)
MCHC: 30.1 g/dL (ref 30.0–36.0)
MCV: 93.2 fL (ref 80.0–100.0)
Monocytes Absolute: 0.5 10*3/uL (ref 0.1–1.0)
Monocytes Relative: 8 %
Neutro Abs: 4.3 10*3/uL (ref 1.7–7.7)
Neutrophils Relative %: 80 %
Platelets: 175 10*3/uL (ref 150–400)
RBC: 3.07 MIL/uL — ABNORMAL LOW (ref 3.87–5.11)
RDW: 19.4 % — ABNORMAL HIGH (ref 11.5–15.5)
WBC: 5.5 10*3/uL (ref 4.0–10.5)
nRBC: 0 % (ref 0.0–0.2)

## 2021-04-02 LAB — COMPREHENSIVE METABOLIC PANEL
ALT: 12 U/L (ref 0–44)
AST: 23 U/L (ref 15–41)
Albumin: 3.8 g/dL (ref 3.5–5.0)
Alkaline Phosphatase: 65 U/L (ref 38–126)
Anion gap: 7 (ref 5–15)
BUN: 60 mg/dL — ABNORMAL HIGH (ref 8–23)
CO2: 29 mmol/L (ref 22–32)
Calcium: 8.9 mg/dL (ref 8.9–10.3)
Chloride: 102 mmol/L (ref 98–111)
Creatinine, Ser: 1.57 mg/dL — ABNORMAL HIGH (ref 0.44–1.00)
GFR, Estimated: 32 mL/min — ABNORMAL LOW (ref >60)
Glucose, Bld: 103 mg/dL — ABNORMAL HIGH (ref 70–99)
Potassium: 5 mmol/L (ref 3.5–5.1)
Sodium: 138 mmol/L (ref 135–145)
Total Bilirubin: 0.5 mg/dL (ref 0.3–1.2)
Total Protein: 8 g/dL (ref 6.5–8.1)

## 2021-04-02 LAB — RESP PANEL BY RT-PCR (FLU A&B, COVID) ARPGX2
Influenza A by PCR: NEGATIVE
Influenza B by PCR: NEGATIVE
SARS Coronavirus 2 by RT PCR: NEGATIVE

## 2021-04-02 LAB — BRAIN NATRIURETIC PEPTIDE: B Natriuretic Peptide: 402 pg/mL — ABNORMAL HIGH (ref 0.0–100.0)

## 2021-04-02 MED ORDER — IPRATROPIUM BROMIDE 0.02 % IN SOLN
RESPIRATORY_TRACT | Status: AC
  Administered 2021-04-02: 0.5 mg
  Filled 2021-04-02: qty 2.5

## 2021-04-02 MED ORDER — ALBUTEROL SULFATE HFA 108 (90 BASE) MCG/ACT IN AERS: 2.0000 | INHALATION_SPRAY | RESPIRATORY_TRACT | 3 refills | Status: DC | PRN

## 2021-04-02 MED ORDER — PREDNISONE 20 MG PO TABS: 40.0000 mg | ORAL_TABLET | Freq: Every day | ORAL | 0 refills | Status: DC

## 2021-04-02 MED ORDER — FUROSEMIDE 10 MG/ML IJ SOLN
40.0000 mg | INTRAMUSCULAR | Status: AC
  Administered 2021-04-02: 40 mg via INTRAVENOUS
  Filled 2021-04-02: qty 4

## 2021-04-02 MED ORDER — ALBUTEROL SULFATE (2.5 MG/3ML) 0.083% IN NEBU
INHALATION_SOLUTION | RESPIRATORY_TRACT | Status: AC
  Administered 2021-04-02: 5 mg
  Filled 2021-04-02: qty 6

## 2021-04-02 NOTE — Discharge Instructions (Addendum)
Your x-ray showed a little extra fluid on your lungs so we gave you fluid medication to get it off this is also why your legs are swollen.  This should get better over the next couple of days however in the meantime I would like for you to increase the dose of your Lasix to taking 40 mg in the morning and 40 mg in the evening instead of just once a day and only do this for 1 week You may use the albuterol inhaler or nebulizer machine 2 puffs every 4 hours as needed for shortness of breath Please take prednisone daily for 5 days, be aware that this may cause your blood sugar to go up Return to the emergency department immediately for severe or worsening symptoms otherwise see your doctor in 2 days for recheck

## 2021-04-02 NOTE — ED Triage Notes (Signed)
Pt arrived via [facility], c/o for SOB, hx of CHF, has been without rescue inhaler for 1wk, has bilateral lower extremity edema.

## 2021-04-02 NOTE — ED Notes (Signed)
RT called for breathing rx

## 2021-04-02 NOTE — ED Provider Notes (Signed)
[facility] EMERGENCY DEPARTMENT Provider Note   CSN: [id number] Arrival date & time: 04/02/21  2115     History Chief Complaint  Patient presents with   Shortness of Breath    [name] [name] is a 87 y.o. female.   Shortness of Breath  This patient is a very pleasant 87 year old female with a known history of acute respiratory failure with hypoxia, chronic diastolic congestive heart failure, she has known chronic kidney disease stage I some cognitive deficits, she has a history of hyperkalemia, her cardiomyopathy is nonischemic, she is a diabetic, and also has COPD  The patient does not have a implantable defibrillator pacemaker in her left upper chest, she has had this changed in 2020.  She also has a history of multiple valve replacements.  Her last ejection fraction from April 2022 was 25 to 30%, severely decreased function of the left ventricle.  She presents to the hospital today with shortness of breath.  She reports that she ran out of her albuterol several days ago and became more more short of breath but also noticed more more swelling of her legs.  It is unclear if she has been taking all of her medications, she is on multiple medications per day including warfarin, metoprolol, Keppra, Imdur, hydralazine, Synthroid, albuterol, Lasix 40 mg once a day, gabapentin.  Her shortness of breath has been persistent, gradually worsening and became severe today prompting a visit to the emergency department.  There is been no nausea vomiting or diarrhea.  She does not have any coughing with      Past Medical History:  Diagnosis Date   Acute on chronic diastolic CHF (congestive heart failure) ([place]) 05/19/2012   Acute on chronic respiratory failure with hypoxia ([place]) 01/31/2020   Adenomatous polyp of colon 12/16/2002   Dr. [name]/[facility]   Allergy    Calculus of gallbladder with chronic cholecystitis without obstruction 02/09/2009   Qualifier:  Diagnosis of  By: [name], Margaret     Cataract    CHB (complete heart block) ([place]) 10/07/2014       CHF (congestive heart failure) ([place])    a.  EF previously reduced to 25-30% b. EF improved to 60-65% by echo in 10/2017.    Chronic gout due to renal impairment    Per [employer], [facility] Electronic Medical Records System    Chronic kidney disease, stage I 2006   Cognitive communication deficit    Per [employer], [facility] Electronic [facility] Records [facility]    Constipation        Convulsion ([place])    Per [employer], [facility] Electronic [facility] Records [facility]    COPD (chronic obstructive pulmonary disease) ([place])        DJD (degenerative joint disease) of lumbar spine    DM (diabetes mellitus) ([place]) 2006   Dysphagia, unspecified    Per [employer], [facility] Electronic Medical Records [facility]    Esophageal reflux    Glaucoma    Gout        H/O adenomatous polyp of colon 05/24/2012   2004 in [place] Last colonoscopy 2008 normal     H/O heart valve replacement with mechanical valve    a. s/p mechanical AVR in 2001 and mechanical MVR in 2004.   H/O wisdom tooth extraction    HIP PAIN, RIGHT 04/23/2009   Qualifier: Diagnosis of  By: [name], Margaret     Hyperkalemia    Per Zadie Cleverly, [facility] Electronic [facility] Records [facility]    Hyperlipemia    Hypertensive heart and kidney disease with HF and with CKD stage I-IV ([place])    Per [employer], [facility] Electronic Medical Records [facility]    IDA (iron deficiency anemia)    Parenteral iron/[name]   Insomnia        Localized edema    Left arm, Per [employer], [facility] Electronic [facility] Records [facility]    Long term (current) use of anticoagulants    Per [employer], [facility] Electronic [facility] Records [facility]    Nausea without vomiting 06/07/2010   Qualifier: Diagnosis of  By: [name], Margaret     Non-ischemic cardiomyopathy [facility])    a. s/p MDT CRTD    Obesity, unspecified    Oxygen deficiency 2014   nocturnal;   Papilloma of breast 03/06/2012   This was diagnosed as a left breast papilloma by ultrasound characteristics and symptoms in 2010. Because of possible medical risk factors no surgical intervention has been done and it did doing annual followups. The area has been stable by physical examination and mammograms and ultrasound since 2010.    Presence of cardiac pacemaker    Per [employer], [facility] Electronic [facility] Records [facility]    Spinal stenosis of lumbar region 02/19/2013   Spondylolisthesis    Spondylosis    Type 2 diabetes mellitus with hyperglycemia ([place]) 07/15/2010   Type 2 diabetes mellitus with hypoglycemia without coma ([place])    Per [employer], [facility] Electronic [facility] Records [facility]    Unspecified chronic bronchitis ([place])    Per [employer], [facility] Records [facility]    Unspecified hypothyroidism        Urinary tract infection, site not specified    Per [employer], [facility] Electronic [facility] Records [facility]    Varicose veins of lower extremities with other complications    Vertigo         Patient Active Problem List   Diagnosis Date Noted   Protein-calorie malnutrition, moderate ([place]) 02/08/2021   Aortic atherosclerosis ([place]) 01/31/2021   Other idiopathic peripheral autonomic neuropathy 01/31/2021   Anemia due to stage 4 chronic kidney disease ([place]) 01/31/2021   Acute renal failure superimposed on stage 4 chronic kidney disease (HCC)    Acute respiratory failure with hypoxia ([place]) 01/18/2021   Acute respiratory failure with hypoxia and hypercapnia ([place]) 01/18/2021   Gait abnormality 08/24/2020   Chronic constipation 08/09/2020   Acute UTI 08/03/2020   Hypertensive heart and kidney disease with chronic systolic congestive heart failure and stage 4 chronic kidney disease ([place]) 07/15/2020   Hyperlipidemia associated with type 2 diabetes mellitus ([place])  07/15/2020   Seizure ([place]) 07/07/2020   Goals of care, counseling/discussion    Palliative care by specialist    Idiopathic hypotension    Arthritis of right knee 06/20/2020   Leg swelling 06/16/2020   Generalized osteoarthritis 05/09/2020   Primary osteoarthritis, left shoulder 43/15/4008   Acute systolic CHF (congestive heart failure) ([place]) 03/31/2020   Bilateral lower extremity edema    Right-sided epistaxis 03/28/2020   Acute pain of left shoulder 01/15/2020   AAA (abdominal aortic aneurysm) without rupture ([place]) 01/14/2020   COVID-19 virus detected 11/23/2019   Stage 3b chronic kidney disease ([place]) 08/04/2019   Dyspnea 05/30/2019   Anemia 05/30/2019   Gout    Glaucoma    GERD (gastroesophageal reflux disease)    Physical debility 04/28/2019   Encounter for examination following treatment  at hospital 11/03/2018   Hyponatremia 09/27/2018   H/O mitral valve replacement with mechanical valve 08/02/2018   Chronic right hip pain 04/16/2018   COPD with acute exacerbation ([place]) 11/15/2017   Trochanteric bursitis, right hip 03/07/2017   Acute on chronic combined systolic and diastolic heart failure ([place]) 11/16/2016   Persistent atrial fibrillation (HCC)    Nocturnal hypoxia 10/08/2016   Dependence on nocturnal oxygen therapy 10/08/2016   Hematuria 03/20/2016   S/P ICD (internal cardiac defibrillator) procedure 02/08/2015   Presence of cardiac pacemaker 02/08/2015   CHB (complete heart block) ([place]) 10/07/2014   Fatigue 07/20/2014   Absolute anemia 06/24/2014   Osteopenia 02/10/2014   Encounter for therapeutic drug monitoring 10/29/2013   OA (osteoarthritis) of knee 02/19/2013   Chronic pain of right knee 01/02/2013   Hemolytic anemia ([place]) 09/24/2012   High risk medication use 05/24/2012   COPD (chronic obstructive pulmonary disease) ([place]) 05/19/2012   Chronic anticoagulation, on coumadin for Mechanical AVR and MVR 04/01/2012   Cardiorenal syndrome with renal failure  03/22/2012   Hx of aortic valve replacement, mechanical 03/22/2012   S/P MVR (mitral valve replacement) 03/22/2012   Biventricular automatic implantable cardioverter defibrillator in situ 03/22/2012   CKD (chronic kidney disease), stage IV ([place]) 03/22/2012   Warfarin-induced coagulopathy ([place]) 03/21/2012   Iron deficiency 10/04/2011   Allergic rhinitis 02/14/2011   Mitral valve disease 07/15/2010   Sinus node dysfunction ([place]) 07/15/2010   Type 2 diabetes mellitus with hypoglycemia without coma ([place]) 07/15/2010   VARICOSE VEINS LOWER EXTREMITIES W/OTH COMPS 02/09/2009   Prediabetes 06/15/2008   Hypothyroidism, postsurgical 02/24/2008   Hyperlipidemia LDL goal <70 02/24/2008   Essential hypertension 02/24/2008   Non-ischemic cardiomyopathy with ICD 02/24/2008   Spondylosis 02/24/2008    Past Surgical History:  Procedure Laterality Date   AORTIC AND MITRAL VALVE REPLACEMENT  2001   BI-VENTRICULAR IMPLANTABLE CARDIOVERTER DEFIBRILLATOR UPGRADE N/A 01/18/2015   Procedure: BI-VENTRICULAR IMPLANTABLE CARDIOVERTER DEFIBRILLATOR UPGRADE;  Surgeon: Evans Lance, MD;  Location: [facility] LAB;  Service: Cardiovascular;  Laterality: N/A;   BIV ICD GENERATOR CHANGEOUT N/A 01/30/2019   Procedure: BIV ICD GENERATOR CHANGEOUT;  Surgeon: Evans Lance, MD;  Location: [place] CV LAB;  Service: Cardiovascular;  Laterality: N/A;   CARDIAC CATHETERIZATION     CARDIAC VALVE REPLACEMENT     CARDIOVERSION N/A 11/13/2016   Procedure: CARDIOVERSION;  Surgeon: Sanda Klein, MD;  Location: [place] ENDOSCOPY;  Service: Cardiovascular;  Laterality: N/A;   COLONOSCOPY  06/12/2007   [place]k- Normal rectum/Normal colon   COLONOSCOPY  12/16/02   small hemorrhoids   COLONOSCOPY  06/20/2012   Procedure: COLONOSCOPY;  Surgeon: Daneil Dolin, MD;  Location[facility];  Service: Endoscopy;  Laterality: N/A;  10:45   defibrillator implanted 2006     DOPPLER ECHOCARDIOGRAPHY  2012   DOPPLER ECHOCARDIOGRAPHY   05,06,07,08,09,2011   ESOPHAGOGASTRODUODENOSCOPY  06/12/2007   Rourk- normal esophagus, small hiatal hernia, otherwise normal stomach, D1, D2   EYE SURGERY Right 2005   EYE SURGERY Left 2006   ICD LEAD REMOVAL Left 02/08/2015   Procedure: ICD LEAD REMOVAL;  Surgeon: Evans Lance, MD;  Location: [place];  Service: Cardiovascular;  Laterality: Left;  "Will plan extraction and insertion of a BiV PM"  **Dr. Roxan Hockey backing up case**   IMPLANTABLE CARDIOVERTER DEFIBRILLATOR (ICD) GENERATOR CHANGE Left 02/08/2015   Procedure: ICD GENERATOR CHANGE;  Surgeon: Evans Lance, MD;  Location: [place];  Service: Cardiovascular;  Laterality: Left;   INSERT /  REPLACE / REMOVE PACEMAKER     PACEMAKER INSERTION  May 2016   pacemaker placed  2004   right breast cyst removed     benign    right cataract removed     2005   TEE WITHOUT CARDIOVERSION N/A 11/13/2016   Procedure: TRANSESOPHAGEAL ECHOCARDIOGRAM (TEE);  Surgeon: Sanda Klein, MD;  Location: [facility] ENDOSCOPY;  Service: Cardiovascular;  Laterality: N/A;   THYROIDECTOMY       OB History     Gravida      Para      Term      Preterm      AB      Living  0      SAB      IAB      Ectopic      Multiple      Live Births              Family History  Problem Relation Age of Onset   Pancreatic cancer Mother    Heart disease Father    Heart disease Sister    Heart attack Sister    Heart disease Brother    Diabetes Brother    Heart disease Brother    Diabetes Brother    Hypertension Brother    Lung cancer Brother     Social History   Tobacco Use   Smoking status: Former    Packs/day: 0.75    Years: 40.00    Pack years: 30.00    Types: Cigarettes    Quit date: 03/08/1987    Years since quitting: 34.0   Smokeless tobacco: Never  Vaping Use   Vaping Use: Never used  Substance Use Topics   Alcohol use: No    Alcohol/week: 0.0 standard drinks   Drug use: No    Home Medications Prior to Admission medications    Medication Sig Start Date End Date Taking? Authorizing Provider  albuterol (VENTOLIN HFA) 108 (90 Base) MCG/ACT inhaler Inhale 2 puffs into the lungs every 4 (four) hours as needed for wheezing or shortness of breath. 04/02/21  Yes Noemi Chapel, MD  predniSONE (DELTASONE) 20 MG tablet Take 2 tablets (40 mg total) by mouth daily. 04/02/21  Yes Noemi Chapel, MD  acetaminophen (TYLENOL) 500 MG tablet Take 500 mg by mouth every 6 (six) hours as needed.    [provider]  albuterol (PROVENTIL) (2.5 MG/3ML) 0.083% nebulizer solution Take 3 mLs (2.5 mg total) by nebulization in the morning and at bedtime. 03/02/21   Gerlene Fee, NP  allopurinol (ZYLOPRIM) 100 MG tablet Take 1 tablet (100 mg total) by mouth daily. 03/02/21   Gerlene Fee, NP  Amino Acids-Protein Hydrolys (FEEDING SUPPLEMENT, PRO-STAT SUGAR FREE 64,) LIQD Take 30 mLs by mouth 2 (two) times daily between meals.    [provider]  Janne Lab Oil [facility]) OINT Apply topically. topical, Every Shift, Apply to bilateral buttocks and sacrum qshift. Patient not taking: Reported on 03/24/2021    [provider]  benzonatate (TESSALON) 100 MG capsule Take 1 capsule (100 mg total) by mouth 2 (two) times daily as needed for cough. 03/02/21   Gerlene Fee, NP  cholecalciferol (VITAMIN D3) 25 MCG (1000 UNIT) tablet Take 1 tablet (1,000 Units total) by mouth in the morning. 07/08/20   Desiree Hane, MD  fluticasone (FLONASE) 50 MCG/ACT nasal spray Place 2 sprays into both nostrils daily.    [provider]  fluticasone-salmeterol (ADVAIR) 250-50 MCG/ACT AEPB Inhale 1  puff into the lungs in the morning and at bedtime. 03/02/21   Gerlene Fee, NP  folic acid (FOLVITE) 1 MG tablet TAKE ONE TABLET BY MOUTH ONCE DAILY. 03/02/21   Gerlene Fee, NP  furosemide (LASIX) 40 MG tablet Take 1 tablet (40 mg total) by mouth daily. 03/02/21   Gerlene Fee, NP  gabapentin (NEURONTIN) 300 MG capsule TAKE (1)  CAPSULE BY MOUTH TWICE DAILY 03/02/21   Gerlene Fee, NP  hydrALAZINE (APRESOLINE) 10 MG tablet Take 1 tablet (10 mg total) by mouth every 8 (eight) hours. 03/02/21   Gerlene Fee, NP  isosorbide mononitrate (IMDUR) 30 MG 24 hr tablet Take 0.5 tablets (15 mg total) by mouth daily. 03/02/21   Gerlene Fee, NP  levETIRAcetam (KEPPRA) 500 MG tablet TAKE (1) TABLET BY MOUTH TWICE DAILY AT 9AM AND 9PM 03/02/21   Gerlene Fee, NP  metoprolol succinate (TOPROL-XL) 50 MG 24 hr tablet Take 1 tablet (50 mg total) by mouth daily. Take with or immediately following a meal. 03/02/21   Nyoka Cowden, Phylis Bougie, NP  nitroGLYCERIN (NITROSTAT) 0.4 MG SL tablet DISSOLVE 1 TABLET SUBLINGUALLY AS NEEDED FOR CHEST PAIN, MAY REPEAT EVERY 5 MINUTES. AFTER 3 CALL 911. 03/02/21   Gerlene Fee, NP  NON FORMULARY Diet :mechanical soft diet with ground meats, NAS, cons CHO Patient not taking: Reported on 03/24/2021 07/26/20   [provider]  ondansetron (ZOFRAN-ODT) 4 MG disintegrating tablet TAKE (1) TABLET BY MOUTH EVERY EIGHT HOURS AS NEEDED FOR NAUSEA OR VOMITING Patient not taking: Reported on 03/24/2021 03/02/21   Gerlene Fee, NP  OXYGEN Inhale 3 L into the lungs continuous. 07/08/20   [provider]  polyethylene glycol (MIRALAX / GLYCOLAX) 17 g packet Take 17 g by mouth daily as needed. 07/08/20   Oretha Milch D, MD  potassium chloride SA (KLOR-CON) 20 MEQ tablet Take 1 tablet (20 mEq total) by mouth daily. 03/02/21   Gerlene Fee, NP  pravastatin (PRAVACHOL) 40 MG tablet Take 1 tablet (40 mg total) by mouth daily. 03/02/21   Gerlene Fee, NP  SYNTHROID 88 MCG tablet TAKE 1 TABLET BEFORE BREAKFAST. 03/02/21   Gerlene Fee, NP  UNABLE TO FIND Compression Stockings R-  Ankle  26.5cm   Calf 38cm  Leg  44cm L-  Ankle  27.5 cm   Calf 39cm  Leg  44cm Patient taking differently: Compression Stockings R-  Ankle  26.5cm   Calf 38cm  Leg  44cm L-  Ankle  27.5 cm   Calf 39cm  Leg  44cm 09/15/20    Fayrene Helper, MD  warfarin (COUMADIN) 10 MG tablet TAKE (1) TABLET BY MOUTH DAILY AT 4PM 03/02/21   Gerlene Fee, NP    Allergies    Penicillins, Aspirin, Fentanyl, and Niacin  Review of Systems   Review of Systems  Respiratory:  Positive for shortness of breath.   All other systems reviewed and are negative.  Physical Exam Updated Vital Signs BP 140/85   Pulse 60   Temp 99.6 F (37.6 C) (Oral)   Resp (!) 28   Ht 1.702 m (5\' 7" )   Wt 86.2 kg   SpO2 99%   BMI 29.76 kg/m   Physical Exam Vitals and nursing note reviewed.  Constitutional:      General: She is not in acute distress.    Appearance: She is well-developed.  HENT:     Head: Normocephalic and atraumatic.  Mouth/Throat:     Pharynx: No oropharyngeal exudate.  Eyes:     General: No scleral icterus.       Right eye: No discharge.        Left eye: No discharge.     Conjunctiva/sclera: Conjunctivae normal.     Pupils: Pupils are equal, round, and reactive to light.  Neck:     Thyroid: No thyromegaly.     Vascular: No JVD.  Cardiovascular:     Rate and Rhythm: Normal rate and regular rhythm.     Heart sounds: Normal heart sounds. No murmur heard.   No friction rub. No gallop.  Pulmonary:     Effort: Respiratory distress present.     Breath sounds: Wheezing and rales present.     Comments: The patient is tachypneic at 35 breaths/min, she has a heart rate that is closer to 60 bpm based on her EKG, her lung sounds are abnormal, she has subtle rales at the bases, she has some expiratory wheezing, she is not using accessory muscles but is tachypneic Abdominal:     General: Bowel sounds are normal. There is no distension.     Palpations: Abdomen is soft. There is no mass.     Tenderness: There is no abdominal tenderness.  Musculoskeletal:        General: No tenderness. Normal range of motion.     Cervical back: Normal range of motion and neck supple.     Right lower leg: No tenderness. Edema present.      Left lower leg: No tenderness. Edema present.  Lymphadenopathy:     Cervical: No cervical adenopathy.  Skin:    General: Skin is warm and dry.     Findings: No erythema or rash.  Neurological:     Mental Status: She is alert.     Coordination: Coordination normal.  Psychiatric:        Behavior: Behavior normal.    ED Results / Procedures / Treatments   Labs (all labs ordered are listed, but only abnormal results are displayed) Labs Reviewed  BRAIN NATRIURETIC PEPTIDE - Abnormal; Notable for the following components:      Result Value   B Natriuretic Peptide 402.0 (*)    All other components within normal limits  COMPREHENSIVE METABOLIC PANEL - Abnormal; Notable for the following components:   Glucose, Bld 103 (*)    BUN 60 (*)    Creatinine, Ser 1.57 (*)    GFR, Estimated 32 (*)    All other components within normal limits  CBC WITH DIFFERENTIAL/PLATELET - Abnormal; Notable for the following components:   RBC 3.07 (*)    Hemoglobin 8.6 (*)    HCT 28.6 (*)    RDW 19.4 (*)    All other components within normal limits  RESP PANEL BY RT-PCR (FLU A&B, COVID) [id number]    EKG None  Radiology DG Chest Port 1 View  Result Date: 04/02/2021 CLINICAL DATA:  Cough and shortness of breath EXAM: PORTABLE CHEST 1 VIEW COMPARISON:  01/20/2021 FINDINGS: Cardiac shadow is enlarged but stable. Defibrillator and postsurgical changes are again seen. Increased vascular congestion is noted with mild interstitial edema. No focal infiltrate or effusion is noted. IMPRESSION: Changes of mild CHF.  No focal infiltrate is noted. Electronically Signed   By: Inez Catalina M.D.   On: 04/02/2021 22:41    Procedures Procedures   Medications Ordered in ED Medications  ipratropium (ATROVENT) 0.02 % nebulizer solution (0.5 mg  Given 04/02/21 2150)  albuterol (PROVENTIL) (2.5 MG/3ML) 0.083% nebulizer solution (5 mg  Given 04/02/21 2150)  furosemide (LASIX) injection 40 mg (40 mg Intravenous Given 04/02/21  2214)    ED Course  I have reviewed the triage vital signs and the nursing notes.  Pertinent labs & imaging results that were available during my care of the patient were reviewed by me and considered in my medical decision making (see chart for details).    MDM Rules/Calculators/A&P                          Whether the patient's symptoms are COPD or CHF or some combination of them she does appear tachypneic and ill and likely needs to be treated with ongoing diuresis and likely in an inpatient setting given her general debilitated status with recurrent congestive heart failure exacerbation.  Will order labs, Lasix, chest x-ray, the patient is agreeable to the  The patient did receive a continuous neb and states that she feels a little bit better  I have looked at the patient's x-ray there is a little bit of pulmonary edema, she has been given Lasix and a breathing treatment and now appears completely comfortable, she is not tachypneic she is not short of breath she is speaking in full sentences.  She feels better and states that she wants to go home.  I did offer the patient further evaluation and treatment but she states that she would rather go home and continue treatment at home.  I think this is reasonable given her stable situation.  The computer continues to read her electric heart tracing on the cardiac monitor as 150 or more beats per minute however she has a heart rate of 60 by auscultation and clinical pulse, it is picking up the electrical background noise.  At this time the patient is improved, she wants to go home, she understands indications for return and I think that is okay.  Final Clinical Impression(s) / ED Diagnoses Final diagnoses:  COPD exacerbation ([place])  Acute on chronic congestive heart failure, unspecified heart failure type ([place])    Rx / DC Orders ED Discharge Orders          Ordered    albuterol (VENTOLIN HFA) 108 (90 Base) MCG/ACT inhaler  Every 4 hours PRN         04/02/21 2345    predniSONE (DELTASONE) 20 MG tablet  Daily        04/02/21 2345             Noemi Chapel, MD 04/02/21 2348

## 2021-04-04 ENCOUNTER — Other Ambulatory Visit: Payer: Self-pay

## 2021-04-04 ENCOUNTER — Encounter: Payer: Self-pay | Admitting: Family Medicine

## 2021-04-04 ENCOUNTER — Ambulatory Visit (INDEPENDENT_AMBULATORY_CARE_PROVIDER_SITE_OTHER): Payer: Medicare Other | Admitting: Family Medicine

## 2021-04-04 VITALS — BP 112/64 | Resp 16 | Ht 67.0 in | Wt 193.0 lb

## 2021-04-04 DIAGNOSIS — E11649 Type 2 diabetes mellitus with hypoglycemia without coma: Secondary | ICD-10-CM | POA: Diagnosis not present

## 2021-04-04 DIAGNOSIS — I5022 Chronic systolic (congestive) heart failure: Secondary | ICD-10-CM

## 2021-04-04 DIAGNOSIS — Z13220 Encounter for screening for lipoid disorders: Secondary | ICD-10-CM

## 2021-04-04 DIAGNOSIS — E890 Postprocedural hypothyroidism: Secondary | ICD-10-CM | POA: Diagnosis not present

## 2021-04-04 DIAGNOSIS — Z09 Encounter for follow-up examination after completed treatment for conditions other than malignant neoplasm: Secondary | ICD-10-CM | POA: Diagnosis not present

## 2021-04-04 DIAGNOSIS — I700 Atherosclerosis of aorta: Secondary | ICD-10-CM | POA: Diagnosis not present

## 2021-04-04 DIAGNOSIS — M1711 Unilateral primary osteoarthritis, right knee: Secondary | ICD-10-CM | POA: Diagnosis not present

## 2021-04-04 DIAGNOSIS — N184 Chronic kidney disease, stage 4 (severe): Secondary | ICD-10-CM

## 2021-04-04 DIAGNOSIS — E1169 Type 2 diabetes mellitus with other specified complication: Secondary | ICD-10-CM

## 2021-04-04 DIAGNOSIS — G8929 Other chronic pain: Secondary | ICD-10-CM

## 2021-04-04 DIAGNOSIS — M25561 Pain in right knee: Secondary | ICD-10-CM | POA: Diagnosis not present

## 2021-04-04 DIAGNOSIS — E785 Hyperlipidemia, unspecified: Secondary | ICD-10-CM

## 2021-04-04 DIAGNOSIS — M19012 Primary osteoarthritis, left shoulder: Secondary | ICD-10-CM | POA: Diagnosis not present

## 2021-04-04 DIAGNOSIS — I130 Hypertensive heart and chronic kidney disease with heart failure and stage 1 through stage 4 chronic kidney disease, or unspecified chronic kidney disease: Secondary | ICD-10-CM

## 2021-04-04 IMAGING — CT CT CHEST W/O CM
2 of 4 series · 15 of 36 positions shown, 18 images · non-contrast
Comparison: 05/27/2020, 07/30/2018

CLINICAL DATA: Fell out of bed, left-sided chest and rib pain

EXAM:
CT CHEST WITHOUT CONTRAST
TECHNIQUE: Multidetector CT imaging of the chest was performed following the
standard protocol without IV contrast. Unenhanced CT was performed
per clinician order. Lack of IV contrast limits sensitivity and
specificity, especially for evaluation of vascular structures.

[Series 2: routine chest without · axial · non-contrast · 0.79mm/px · z∈[+969,+1227]mm · 12 of 153 slices shown, 15 images]
[im 12/153  mediastinal]
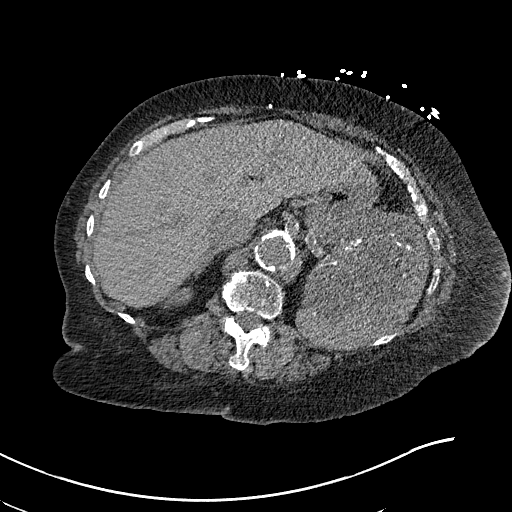
[im 12/153  lung]
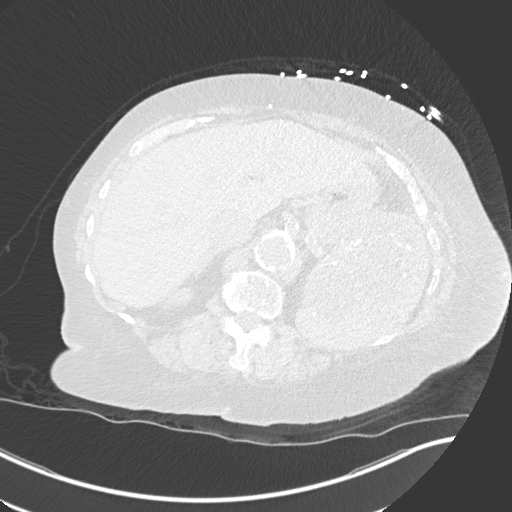
[im 24/153  lung]
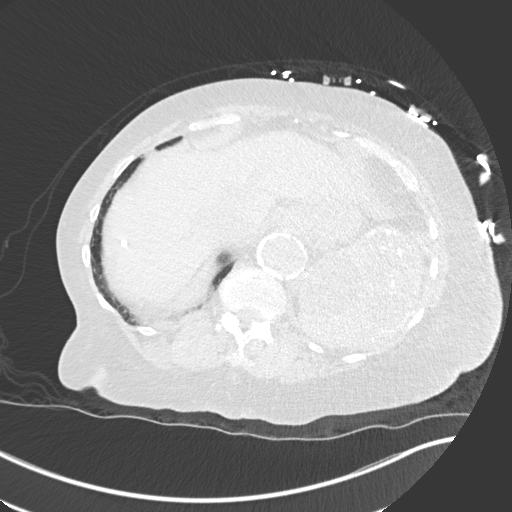
[im 36/153  lung]
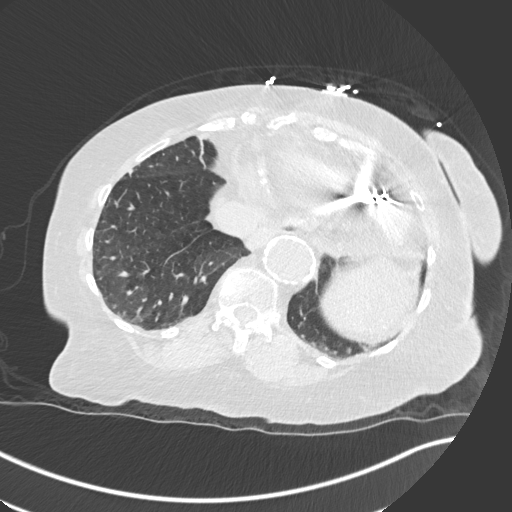
[im 47/153  lung]
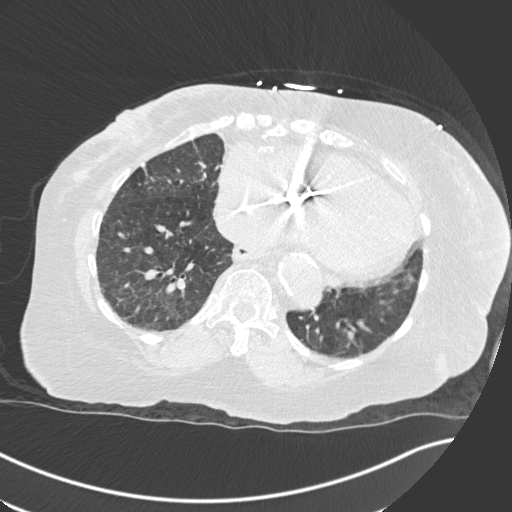
[im 59/153  mediastinal]
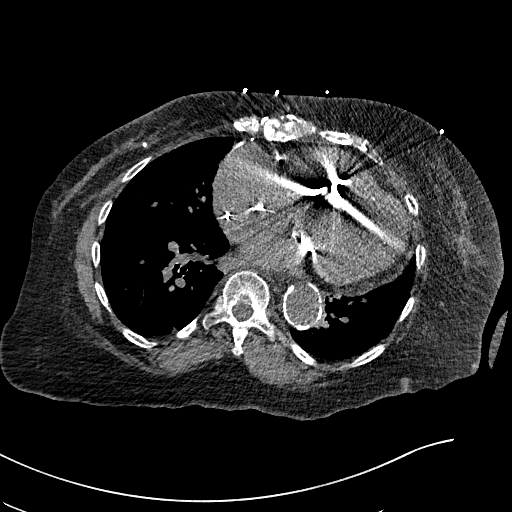
[im 59/153  lung]
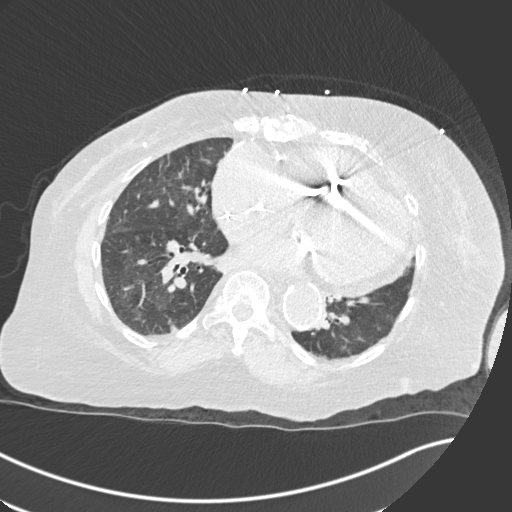
[im 71/153  lung]
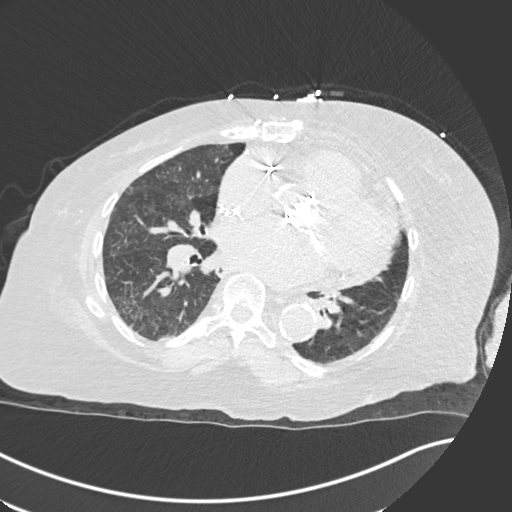
[im 82/153  lung]
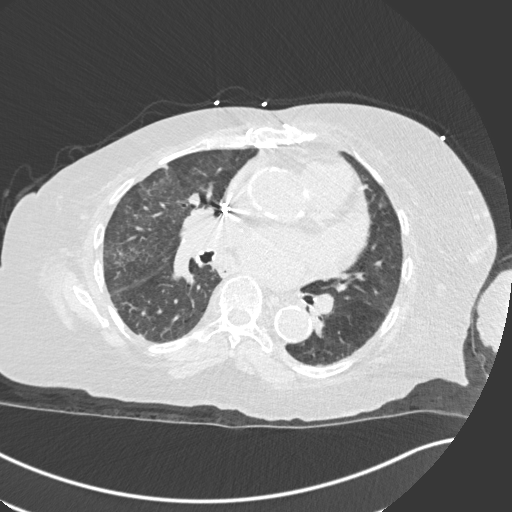
[im 94/153  lung]
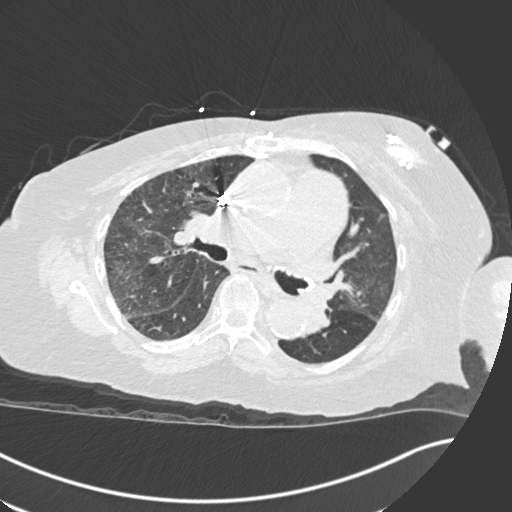
[im 106/153  mediastinal]
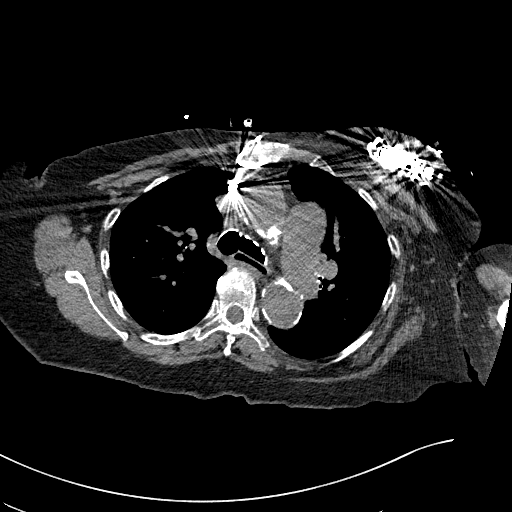
[im 106/153  lung]
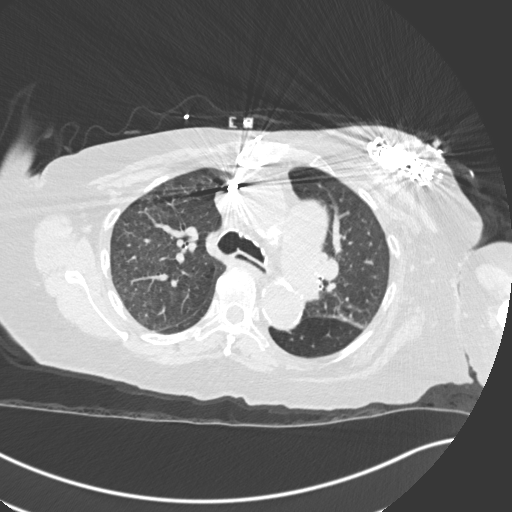
[im 117/153  lung]
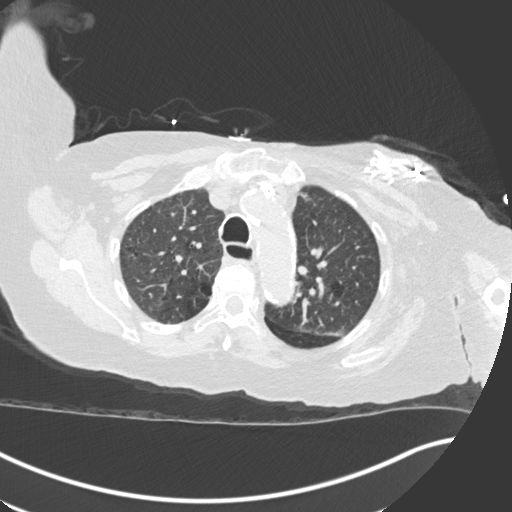
[im 129/153  lung]
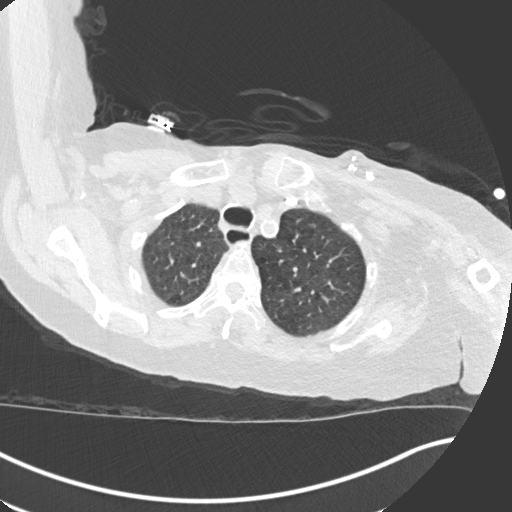
[im 141/153  lung]
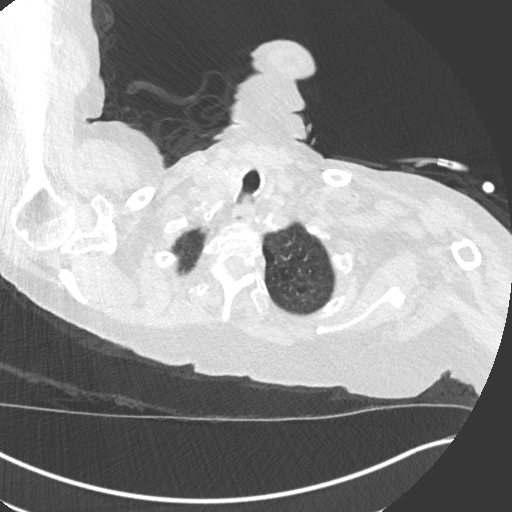

[Series 5: coronal · coronal · 0.63mm/px · 3 of 150 slices shown]
[im 30/150  lung]
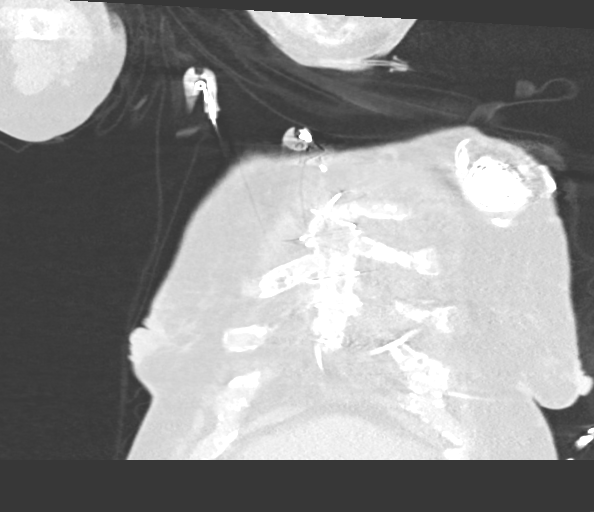
[im 60/150  lung]
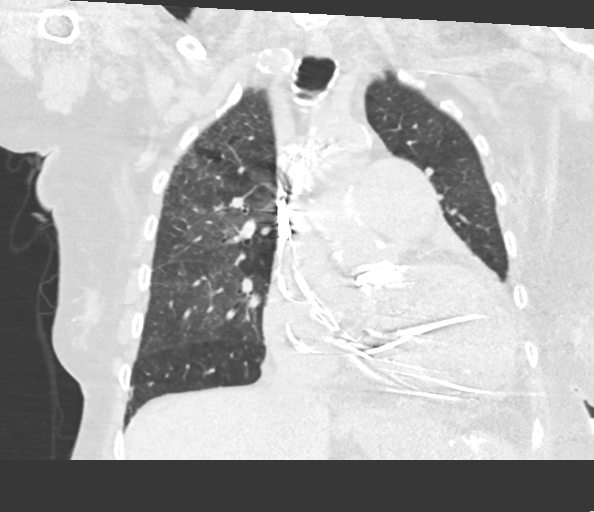
[im 90/150  lung]
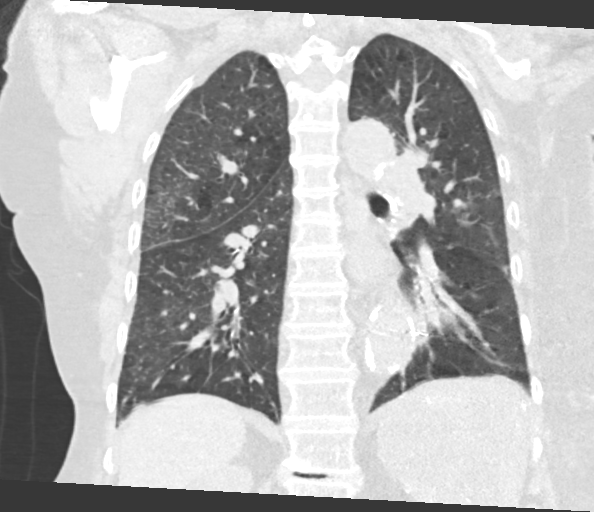

[15 of 36 positions shown; findings below may reference images not displayed]

FINDINGS: Cardiovascular: The heart is enlarged, but stable since prior study.
No pericardial effusion. Multi lead pacer/AICD again noted. Aortic
and mitral valve prostheses are identified.

There is severe atherosclerosis of the aorta and coronary vessels.
Evaluation of the vascular lumen is limited without IV contrast.

Mediastinum/Nodes: No enlarged mediastinal or axillary lymph nodes.
Thyroid gland, trachea, and esophagus demonstrate no significant
findings.

Lungs/Pleura: Upper lobe predominant emphysema is noted. There is
increased interlobular septal thickening asymmetric on the right,
compatible with mild interstitial edema. No acute airspace disease,
effusion, or pneumothorax. The central airways are patent.

Upper Abdomen: Peripherally calcified hypodensities within the
spleen are nonspecific but stable since at least 0290. No acute
upper abdominal findings.

Musculoskeletal: There is a minimally displaced left anterolateral
fourth rib fracture. No other acute bony abnormalities. Chronic
midthoracic wedge compression deformity. Reconstructed images
demonstrate no additional findings.
IMPRESSION: 1. Minimally displaced left anterior fourth rib fracture.
2. Mild interlobular septal thickening superimposed upon background
emphysema, consistent with mild interstitial edema.
3. Stable cardiomegaly.
4. Aortic Atherosclerosis (FNQ33-77A.A) and Emphysema ([id number].M).

## 2021-04-04 IMAGING — DX DG CHEST 2V
2 series · 2 of 2 positions shown · non-contrast
Comparison: Radiograph 05/17/2020, CT 07/30/2018

CLINICAL DATA: Chest pain, shortness of breath

EXAM:
CHEST - 2 VIEW

[chest lat]
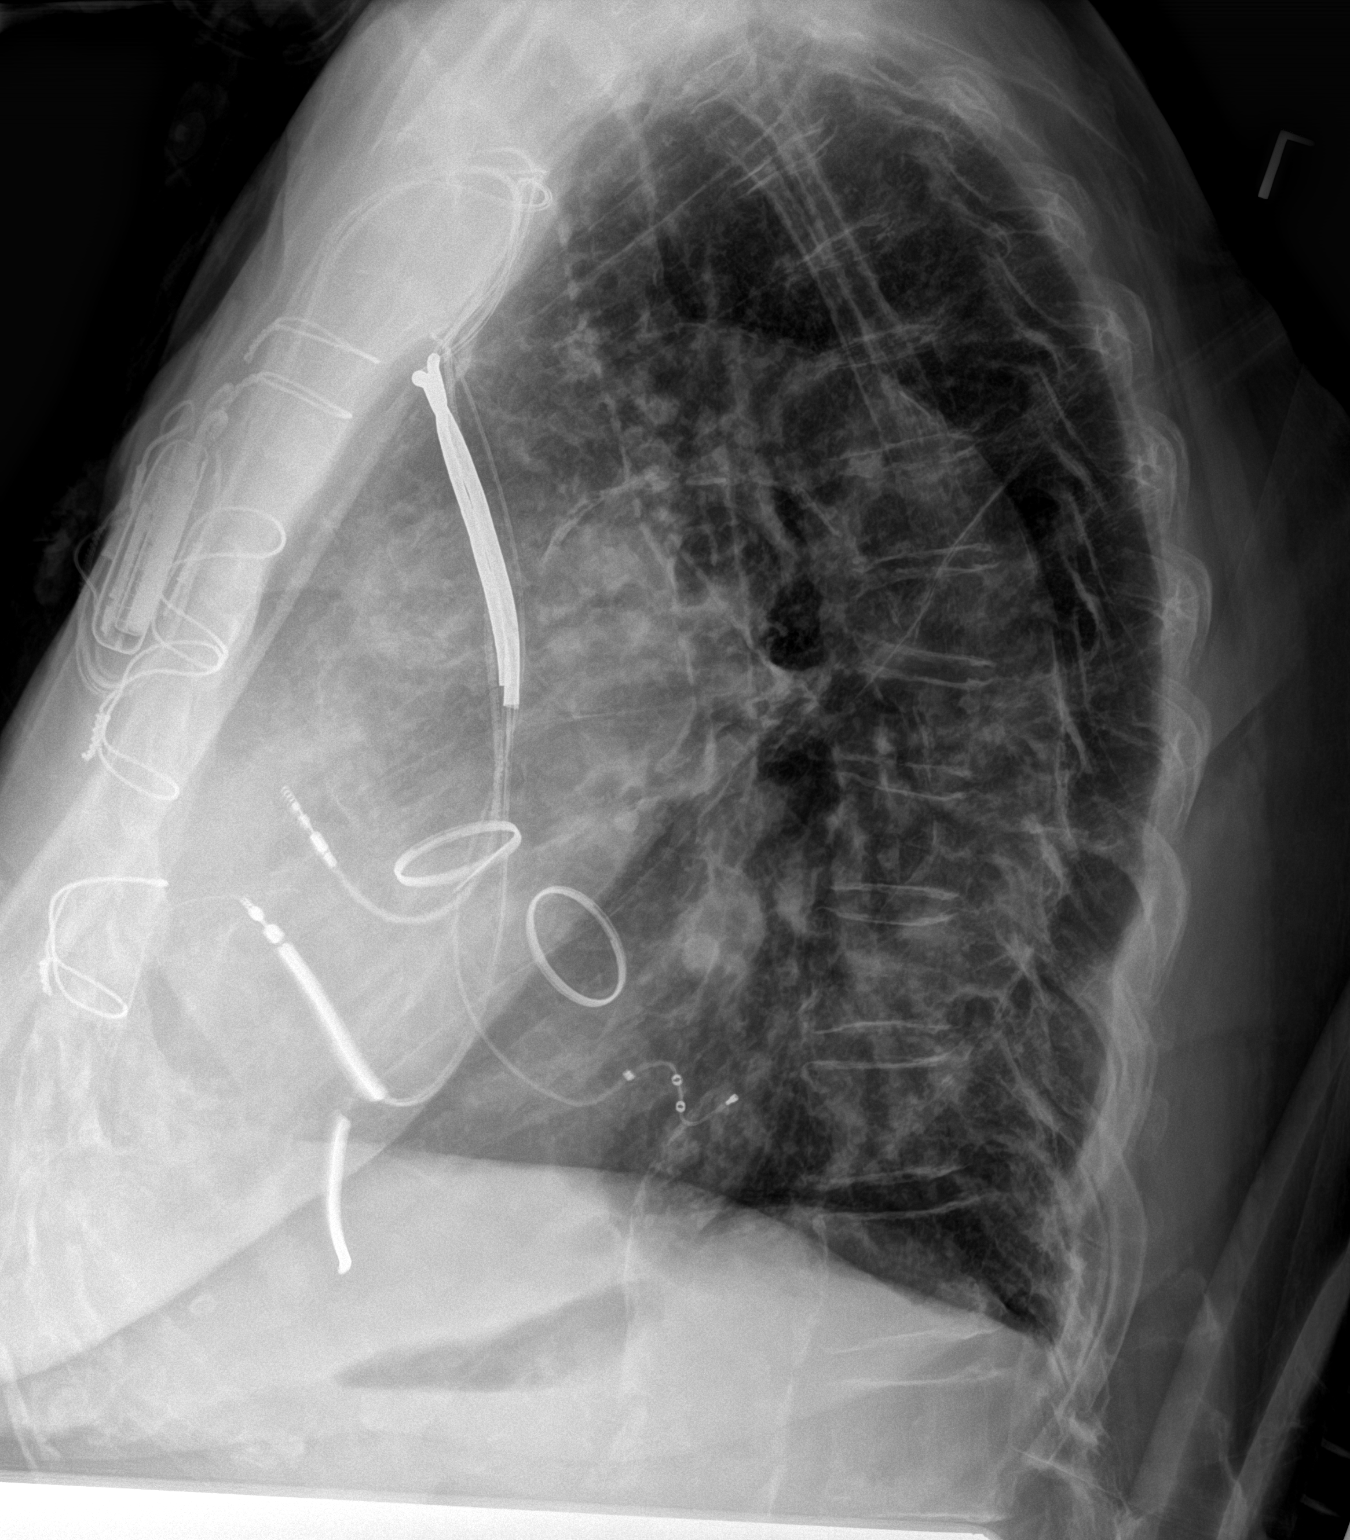

[chest ap]
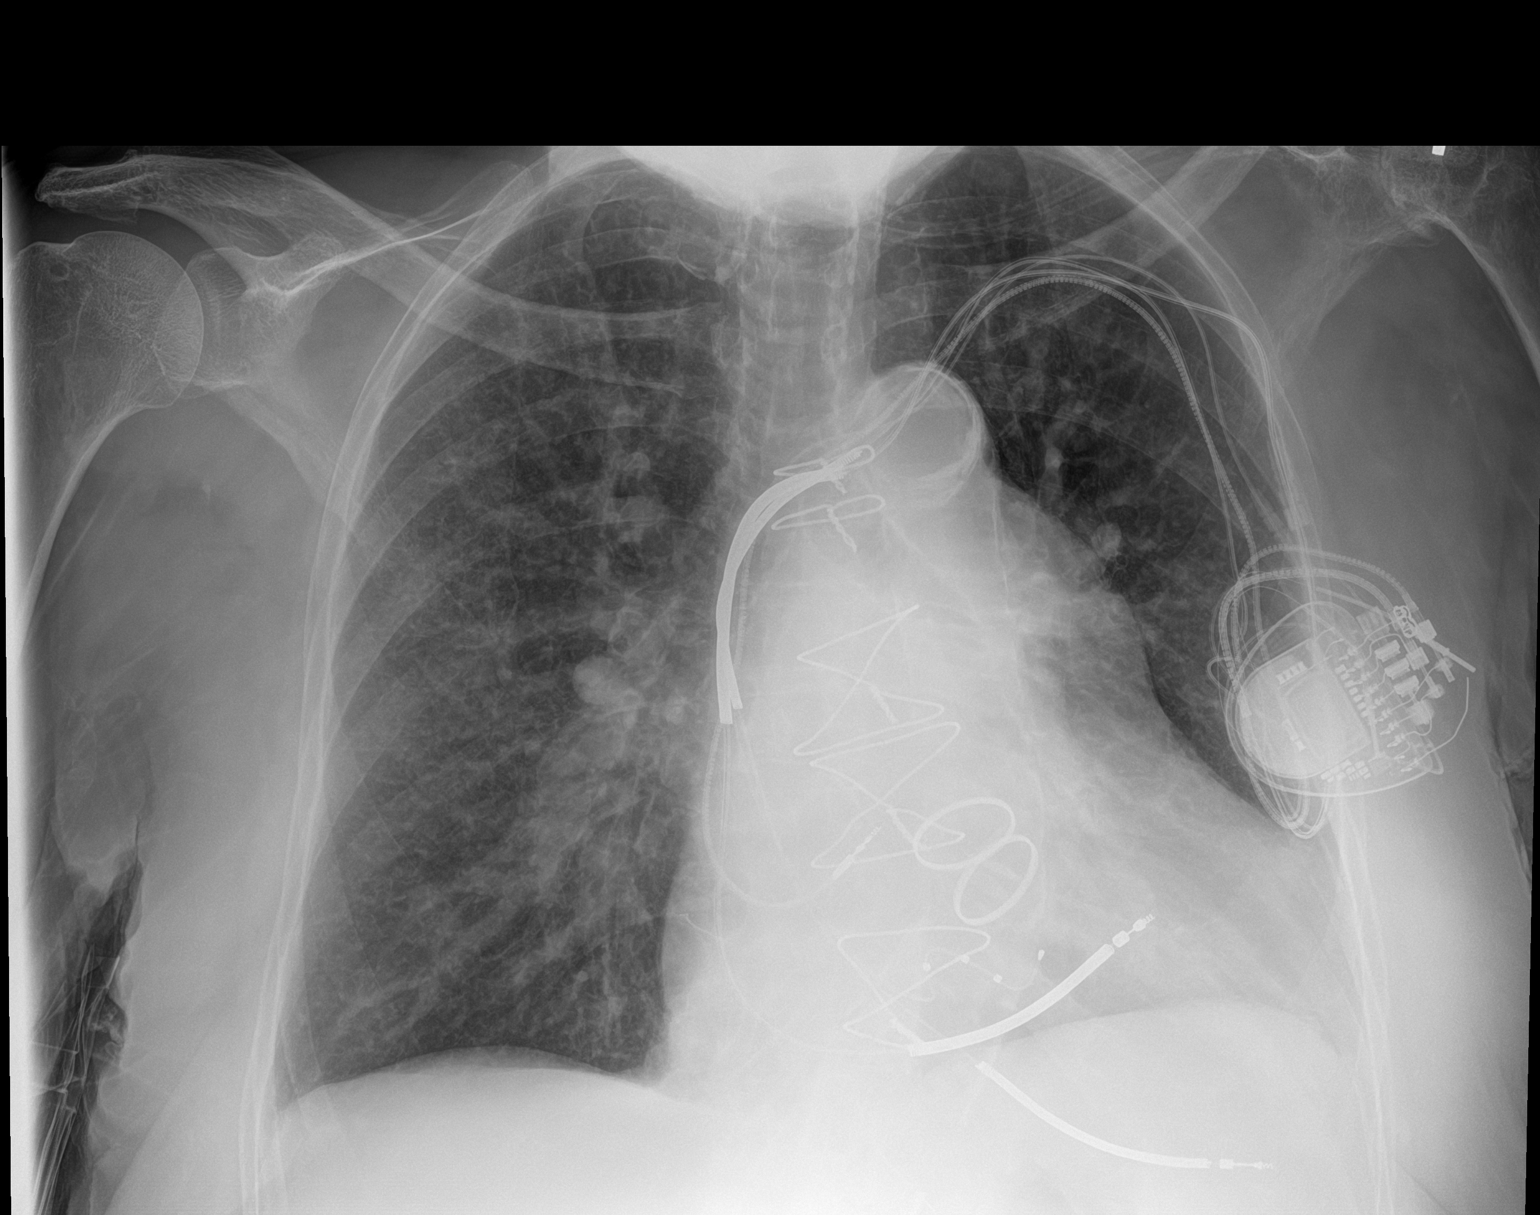

[2 of 2 positions shown; findings below may reference images not displayed]

FINDINGS: Prior sternotomy with evidence of prior mitral and aortic valve
repairs. Stable cardiomegaly and central pulmonary arterial
enlargement with a prominent and calcified aorta as well as a 4 lead
biventricular pacer/AICD device with leads in unchanged position
from comparison.

There is central pulmonary vascular congestion and diffuse hazy
interstitial opacities with septal lines. No pneumothorax. No
consolidative opacity is seen. No acute osseous or soft tissue
abnormality. Left greater than right shoulder arthrosis is again
seen. Additional multilevel degenerative changes in the spine. No
acute osseous or soft tissue abnormality. Specifically, no acute rib
fracture.
IMPRESSION: Cardiomegaly with central pulmonary vascular congestion and diffuse
hazy interstitial opacities with septal lines, suggestive of
pulmonary edema.

Chronic pulmonary artery enlargement compatible with pulmonary
artery hypertension.

Stable prominence of the thoracic aorta with heavy calcification
compatible with known thoracic aortic aneurysm.

Aortic Atherosclerosis (NO1AQ-IRH.H).

## 2021-04-04 MED ORDER — ALBUTEROL SULFATE HFA 108 (90 BASE) MCG/ACT IN AERS: 2.0000 | INHALATION_SPRAY | RESPIRATORY_TRACT | 3 refills | Status: AC | PRN

## 2021-04-04 MED ORDER — ONDANSETRON 4 MG PO TBDP: ORAL_TABLET | ORAL | 3 refills | Status: AC

## 2021-04-04 NOTE — Assessment & Plan Note (Signed)
Patient in for follow up of recent ED visit on 7/09 for acute exaccerbation of COPD , and is also re evaluated for recent hospitalization and subsequent SNF placement , when she admitted for acute respiratory falure Discharge summary, and laboratory and radiology data are reviewed, and any questions or concerns  are discussed. Specific issues requiring follow up are specifically addressed.

## 2021-04-04 NOTE — Assessment & Plan Note (Signed)
Increased pain with reduced mobility refer Ortho 

## 2021-04-04 NOTE — Progress Notes (Signed)
   [name] [name]     MRN: [id number]      DOB: 09/18/1932   HPI [name] [name] is here for follow up and re-evaluation of chronic medical conditions, medication management and review of any available recent lab and radiology data.  Preventive health is updated, specifically  Cancer screening and Immunization.   She was hospitalized from 4/26 to 5/6 ,  with dx of acute respiratory failure with hypoxia then in a SNF up until 03/14/2021 Doing fairly well at home where she prefers to be Called late last week for permission to increase lasix short term which has helped, but was in the ED on 04/02/2021, with COPD exacerbation, doing better, has rx for prednisone 40 mg daily which is too much with her coumadin, so since breathing is improved I have reduced the dose to 10 mg for a few days The PT denies any adverse reactions to current medications since the last visit.  There are no new concerns.  There are no specific complaints   ROS Denies recent fever or chills. Denies sinus pressure, nasal congestion, ear pain or sore throat. Denies chest congestion, productive cough or wheezing. Denies chest pains, palpitations and leg swelling Denies abdominal pain, nausea, vomiting,diarrhea or constipation.   Denies dysuria, frequency, hesitancy or incontinence. C/o severe and disabling left shoulder and right knee pain, no recent trauma Denies headaches, seizures, numbness, or tingling. Denies depression, anxiety or insomnia. Denies skin break down or rash.   PE  BP 112/64   Resp 16   Ht 5\' 7"  (1.702 m)   Wt 193 lb (87.5 kg)   SpO2 92%   BMI 30.23 kg/m   Patient alert and oriented and in no cardiopulmonary distress.  HEENT: No facial asymmetry, EOMI,     Neck supple .  Chest: Clear to auscultation bilaterally.  CVS: S1, S2 systolic murmur, no S3.IrRegular rate.  ABD: Soft non tender.   Ext: No edema  MS: decreased  ROM spine, markedly reduced left shoulder, hips and  markedly reduced ROM  right knee  Skin: Intact, no ulcerations or rash noted.  Psych: Good eye contact, normal affect. Memory intact not anxious or depressed appearing.  CNS: CN 2-12 intact, power,  normal throughout.no focal deficits noted.   Assessment & Plan  Arthritis of right knee C/o increased pain refer Ortho  Primary osteoarthritis, left shoulder Increased pain with reduced mobility refer Ortho  Encounter for examination following treatment at hospital Patient in for follow up of recent ED visit on 7/09 for acute exaccerbation of COPD , and is also re evaluated for recent hospitalization and subsequent SNF placement , when she admitted for acute respiratory falure Discharge summary, and laboratory and radiology data are reviewed, and any questions or concerns  are discussed. Specific issues requiring follow up are specifically addressed.   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

## 2021-04-04 NOTE — Patient Instructions (Addendum)
Change f/u from 7/21 to end August in the office, f;lu vaccine at that visit  Take Prednisone 20 mg tablet  HALF tablet One daily from today, Monday through Thursday, 4 days total  You are referred to Dr Luna Glasgow re left shoulder and right knee pain  The albuterol inhaler ( pump)should not be needed/ used more than two times daily, max, as you are already taking that same medication regularly in  your machine    Please get fasting lipid, cmp and EGFR and TSH 3 to 7 days before your n August appointment  Be careful not to fall  Thanks for choosing [facility], we consider it a privelige to serve you.

## 2021-04-04 NOTE — Assessment & Plan Note (Signed)
C/o increased pain refer Ortho

## 2021-04-04 NOTE — Assessment & Plan Note (Signed)
Controlled, no change in medication  

## 2021-04-04 NOTE — Assessment & Plan Note (Signed)
Updated lab needed at/ before next visit.   

## 2021-04-04 NOTE — Assessment & Plan Note (Signed)
Increased and uncontrolled pain, refer Orthopedics

## 2021-04-05 ENCOUNTER — Telehealth: Payer: Medicare Other

## 2021-04-05 ENCOUNTER — Ambulatory Visit (INDEPENDENT_AMBULATORY_CARE_PROVIDER_SITE_OTHER): Payer: Medicare Other | Admitting: *Deleted

## 2021-04-05 ENCOUNTER — Telehealth: Payer: Self-pay | Admitting: *Deleted

## 2021-04-05 DIAGNOSIS — Z5181 Encounter for therapeutic drug level monitoring: Secondary | ICD-10-CM

## 2021-04-05 DIAGNOSIS — I130 Hypertensive heart and chronic kidney disease with heart failure and stage 1 through stage 4 chronic kidney disease, or unspecified chronic kidney disease: Secondary | ICD-10-CM | POA: Diagnosis not present

## 2021-04-05 DIAGNOSIS — N184 Chronic kidney disease, stage 4 (severe): Secondary | ICD-10-CM | POA: Diagnosis not present

## 2021-04-05 DIAGNOSIS — N179 Acute kidney failure, unspecified: Secondary | ICD-10-CM | POA: Diagnosis not present

## 2021-04-05 DIAGNOSIS — Z952 Presence of prosthetic heart valve: Secondary | ICD-10-CM

## 2021-04-05 DIAGNOSIS — E1122 Type 2 diabetes mellitus with diabetic chronic kidney disease: Secondary | ICD-10-CM | POA: Diagnosis not present

## 2021-04-05 DIAGNOSIS — J441 Chronic obstructive pulmonary disease with (acute) exacerbation: Secondary | ICD-10-CM | POA: Diagnosis not present

## 2021-04-05 DIAGNOSIS — Z7901 Long term (current) use of anticoagulants: Secondary | ICD-10-CM | POA: Diagnosis not present

## 2021-04-05 DIAGNOSIS — I5043 Acute on chronic combined systolic (congestive) and diastolic (congestive) heart failure: Secondary | ICD-10-CM | POA: Diagnosis not present

## 2021-04-05 LAB — POCT INR: INR: 2 (ref 2.0–3.0)

## 2021-04-05 NOTE — Telephone Encounter (Signed)
INR 2.0 PT 23.9   Please call Janett Billow [phone]

## 2021-04-05 NOTE — Patient Instructions (Signed)
Continue warfarin 1 tablet  (10mg ) daily except for 1/2 tablet (5mg )  on Monday, Wednesday and Friday. Order given to [place] in 1 wk

## 2021-04-05 NOTE — Telephone Encounter (Signed)
Spoke with [facility].  Coumadin orders given.  See anticoag note.

## 2021-04-06 DIAGNOSIS — I130 Hypertensive heart and chronic kidney disease with heart failure and stage 1 through stage 4 chronic kidney disease, or unspecified chronic kidney disease: Secondary | ICD-10-CM | POA: Diagnosis not present

## 2021-04-06 DIAGNOSIS — I5043 Acute on chronic combined systolic (congestive) and diastolic (congestive) heart failure: Secondary | ICD-10-CM | POA: Diagnosis not present

## 2021-04-06 DIAGNOSIS — J441 Chronic obstructive pulmonary disease with (acute) exacerbation: Secondary | ICD-10-CM | POA: Diagnosis not present

## 2021-04-06 DIAGNOSIS — N179 Acute kidney failure, unspecified: Secondary | ICD-10-CM | POA: Diagnosis not present

## 2021-04-06 DIAGNOSIS — E1122 Type 2 diabetes mellitus with diabetic chronic kidney disease: Secondary | ICD-10-CM | POA: Diagnosis not present

## 2021-04-06 DIAGNOSIS — N184 Chronic kidney disease, stage 4 (severe): Secondary | ICD-10-CM | POA: Diagnosis not present

## 2021-04-07 DIAGNOSIS — I130 Hypertensive heart and chronic kidney disease with heart failure and stage 1 through stage 4 chronic kidney disease, or unspecified chronic kidney disease: Secondary | ICD-10-CM | POA: Diagnosis not present

## 2021-04-07 DIAGNOSIS — J441 Chronic obstructive pulmonary disease with (acute) exacerbation: Secondary | ICD-10-CM | POA: Diagnosis not present

## 2021-04-07 DIAGNOSIS — N179 Acute kidney failure, unspecified: Secondary | ICD-10-CM | POA: Diagnosis not present

## 2021-04-07 DIAGNOSIS — N184 Chronic kidney disease, stage 4 (severe): Secondary | ICD-10-CM | POA: Diagnosis not present

## 2021-04-07 DIAGNOSIS — I5043 Acute on chronic combined systolic (congestive) and diastolic (congestive) heart failure: Secondary | ICD-10-CM | POA: Diagnosis not present

## 2021-04-07 DIAGNOSIS — E1122 Type 2 diabetes mellitus with diabetic chronic kidney disease: Secondary | ICD-10-CM | POA: Diagnosis not present

## 2021-04-08 ENCOUNTER — Telehealth: Payer: Self-pay | Admitting: *Deleted

## 2021-04-08 ENCOUNTER — Telehealth: Payer: Self-pay

## 2021-04-08 IMAGING — DX DG CHEST 1V PORT
1 series · 1 of 1 positions shown · non-contrast
Comparison: 05/27/2020

CLINICAL DATA: Shortness of breath

EXAM:
PORTABLE CHEST 1 VIEW

[chest ap]
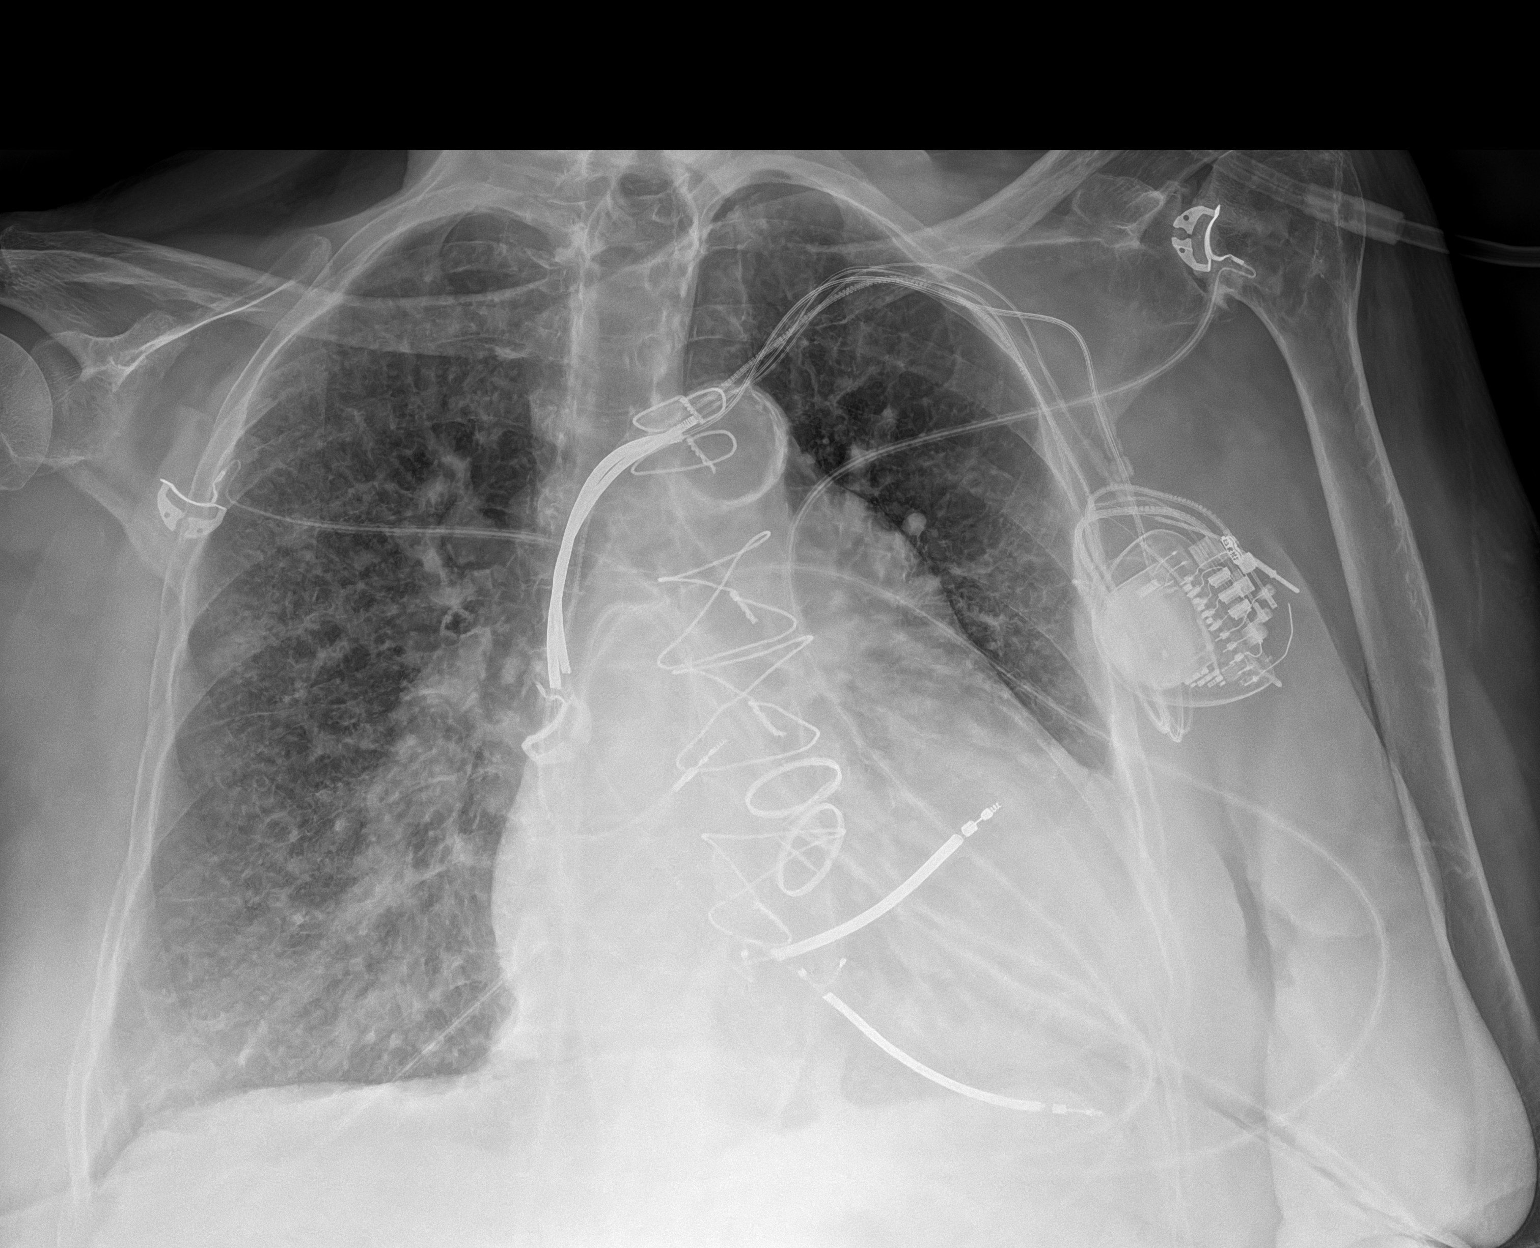

[1 of 1 positions shown; findings below may reference images not displayed]

FINDINGS: Left AICD remains in place, unchanged. Prior valve replacement.
Aortic atherosclerosis. Cardiomegaly with vascular congestion.
Interstitial prominence throughout the lungs may reflect
interstitial edema. No effusions or acute bony abnormality.
IMPRESSION: Cardiomegaly with vascular congestion and mild interstitial edema.

## 2021-04-08 NOTE — Telephone Encounter (Signed)
Left message

## 2021-04-08 NOTE — Telephone Encounter (Signed)
Pt nurse called said that pt is out of all prescriptions but needs a nurse to call as her prescriptions changed [facility]

## 2021-04-08 NOTE — Telephone Encounter (Signed)
Returned call to patient.  She stated she had not heard from me in a while and calling to check on how I was doing.  She said she is doing well despite a recent EF visit due to her lungs.  Advised will check fluid levels 7/25.  She said that would be fine and appreciated the call back.

## 2021-04-11 ENCOUNTER — Other Ambulatory Visit (HOSPITAL_COMMUNITY): Payer: Self-pay | Admitting: *Deleted

## 2021-04-11 ENCOUNTER — Telehealth: Payer: Self-pay | Admitting: *Deleted

## 2021-04-11 DIAGNOSIS — D649 Anemia, unspecified: Secondary | ICD-10-CM

## 2021-04-11 NOTE — Telephone Encounter (Signed)
Pt needs refill on all medication for pill pack sent to [facility]

## 2021-04-12 ENCOUNTER — Other Ambulatory Visit: Payer: Self-pay | Admitting: Family Medicine

## 2021-04-12 ENCOUNTER — Inpatient Hospital Stay (HOSPITAL_COMMUNITY): Payer: Medicare Other | Attending: Hematology

## 2021-04-12 ENCOUNTER — Other Ambulatory Visit (HOSPITAL_COMMUNITY)
Admission: RE | Admit: 2021-04-12 | Discharge: 2021-04-12 | Disposition: A | Discharge status: Home / Self Care | Payer: Medicare Other | Source: Ambulatory Visit | Attending: Nephrology | Admitting: Nephrology

## 2021-04-12 ENCOUNTER — Other Ambulatory Visit: Payer: Self-pay

## 2021-04-12 ENCOUNTER — Ambulatory Visit (INDEPENDENT_AMBULATORY_CARE_PROVIDER_SITE_OTHER): Payer: Medicare Other | Admitting: *Deleted

## 2021-04-12 ENCOUNTER — Inpatient Hospital Stay (HOSPITAL_COMMUNITY): Payer: Medicare Other

## 2021-04-12 ENCOUNTER — Telehealth: Payer: Self-pay

## 2021-04-12 ENCOUNTER — Encounter (HOSPITAL_COMMUNITY): Payer: Self-pay

## 2021-04-12 VITALS — BP 128/56 | HR 60 | Temp 96.9°F | Resp 20

## 2021-04-12 DIAGNOSIS — I129 Hypertensive chronic kidney disease with stage 1 through stage 4 chronic kidney disease, or unspecified chronic kidney disease: Secondary | ICD-10-CM | POA: Insufficient documentation

## 2021-04-12 DIAGNOSIS — D649 Anemia, unspecified: Secondary | ICD-10-CM

## 2021-04-12 DIAGNOSIS — D631 Anemia in chronic kidney disease: Secondary | ICD-10-CM | POA: Insufficient documentation

## 2021-04-12 DIAGNOSIS — I429 Cardiomyopathy, unspecified: Secondary | ICD-10-CM | POA: Diagnosis not present

## 2021-04-12 DIAGNOSIS — I5043 Acute on chronic combined systolic (congestive) and diastolic (congestive) heart failure: Secondary | ICD-10-CM | POA: Insufficient documentation

## 2021-04-12 DIAGNOSIS — N189 Chronic kidney disease, unspecified: Secondary | ICD-10-CM | POA: Insufficient documentation

## 2021-04-12 DIAGNOSIS — N184 Chronic kidney disease, stage 4 (severe): Secondary | ICD-10-CM

## 2021-04-12 DIAGNOSIS — N1832 Chronic kidney disease, stage 3b: Secondary | ICD-10-CM | POA: Insufficient documentation

## 2021-04-12 DIAGNOSIS — M48061 Spinal stenosis, lumbar region without neurogenic claudication: Secondary | ICD-10-CM | POA: Diagnosis not present

## 2021-04-12 DIAGNOSIS — E890 Postprocedural hypothyroidism: Secondary | ICD-10-CM | POA: Diagnosis not present

## 2021-04-12 DIAGNOSIS — Z5181 Encounter for therapeutic drug level monitoring: Secondary | ICD-10-CM

## 2021-04-12 DIAGNOSIS — N179 Acute kidney failure, unspecified: Secondary | ICD-10-CM | POA: Diagnosis not present

## 2021-04-12 DIAGNOSIS — E559 Vitamin D deficiency, unspecified: Secondary | ICD-10-CM | POA: Diagnosis not present

## 2021-04-12 DIAGNOSIS — Z952 Presence of prosthetic heart valve: Secondary | ICD-10-CM | POA: Diagnosis not present

## 2021-04-12 DIAGNOSIS — G9341 Metabolic encephalopathy: Secondary | ICD-10-CM | POA: Diagnosis not present

## 2021-04-12 DIAGNOSIS — J9622 Acute and chronic respiratory failure with hypercapnia: Secondary | ICD-10-CM | POA: Diagnosis not present

## 2021-04-12 DIAGNOSIS — J9621 Acute and chronic respiratory failure with hypoxia: Secondary | ICD-10-CM | POA: Diagnosis not present

## 2021-04-12 DIAGNOSIS — I130 Hypertensive heart and chronic kidney disease with heart failure and stage 1 through stage 4 chronic kidney disease, or unspecified chronic kidney disease: Secondary | ICD-10-CM | POA: Diagnosis not present

## 2021-04-12 DIAGNOSIS — E1142 Type 2 diabetes mellitus with diabetic polyneuropathy: Secondary | ICD-10-CM | POA: Diagnosis not present

## 2021-04-12 DIAGNOSIS — Z7901 Long term (current) use of anticoagulants: Secondary | ICD-10-CM

## 2021-04-12 DIAGNOSIS — Z9981 Dependence on supplemental oxygen: Secondary | ICD-10-CM | POA: Diagnosis not present

## 2021-04-12 DIAGNOSIS — N183 Chronic kidney disease, stage 3 unspecified: Secondary | ICD-10-CM | POA: Insufficient documentation

## 2021-04-12 DIAGNOSIS — G40909 Epilepsy, unspecified, not intractable, without status epilepticus: Secondary | ICD-10-CM | POA: Diagnosis not present

## 2021-04-12 DIAGNOSIS — Z95810 Presence of automatic (implantable) cardiac defibrillator: Secondary | ICD-10-CM | POA: Diagnosis not present

## 2021-04-12 DIAGNOSIS — M1030 Gout due to renal impairment, unspecified site: Secondary | ICD-10-CM | POA: Diagnosis not present

## 2021-04-12 DIAGNOSIS — Z87891 Personal history of nicotine dependence: Secondary | ICD-10-CM | POA: Diagnosis not present

## 2021-04-12 DIAGNOSIS — E785 Hyperlipidemia, unspecified: Secondary | ICD-10-CM | POA: Diagnosis not present

## 2021-04-12 DIAGNOSIS — E1122 Type 2 diabetes mellitus with diabetic chronic kidney disease: Secondary | ICD-10-CM | POA: Diagnosis not present

## 2021-04-12 DIAGNOSIS — J441 Chronic obstructive pulmonary disease with (acute) exacerbation: Secondary | ICD-10-CM | POA: Diagnosis not present

## 2021-04-12 LAB — CBC WITH DIFFERENTIAL/PLATELET
Abs Immature Granulocytes: 0.05 10*3/uL (ref 0.00–0.07)
Basophils Absolute: 0 10*3/uL (ref 0.0–0.1)
Basophils Relative: 0 %
Eosinophils Absolute: 0 10*3/uL (ref 0.0–0.5)
Eosinophils Relative: 0 %
HCT: 29.9 % — ABNORMAL LOW (ref 36.0–46.0)
Hemoglobin: 8.9 g/dL — ABNORMAL LOW (ref 12.0–15.0)
Immature Granulocytes: 1 %
Lymphocytes Relative: 12 %
Lymphs Abs: 0.7 10*3/uL (ref 0.7–4.0)
MCH: 27.4 pg (ref 26.0–34.0)
MCHC: 29.8 g/dL — ABNORMAL LOW (ref 30.0–36.0)
MCV: 92 fL (ref 80.0–100.0)
Monocytes Absolute: 0.5 10*3/uL (ref 0.1–1.0)
Monocytes Relative: 8 %
Neutro Abs: 4.5 10*3/uL (ref 1.7–7.7)
Neutrophils Relative %: 79 %
Platelets: 189 10*3/uL (ref 150–400)
RBC: 3.25 MIL/uL — ABNORMAL LOW (ref 3.87–5.11)
RDW: 18.6 % — ABNORMAL HIGH (ref 11.5–15.5)
WBC: 5.7 10*3/uL (ref 4.0–10.5)
nRBC: 0 % (ref 0.0–0.2)

## 2021-04-12 LAB — IRON AND TIBC
Iron: 27 ug/dL — ABNORMAL LOW (ref 28–170)
Saturation Ratios: 9 % — ABNORMAL LOW (ref 10.4–31.8)
TIBC: 300 ug/dL (ref 250–450)
UIBC: 273 ug/dL

## 2021-04-12 LAB — RENAL FUNCTION PANEL
Albumin: 3.7 g/dL (ref 3.5–5.0)
Anion gap: 6 (ref 5–15)
BUN: 50 mg/dL — ABNORMAL HIGH (ref 8–23)
CO2: 32 mmol/L (ref 22–32)
Calcium: 8.8 mg/dL — ABNORMAL LOW (ref 8.9–10.3)
Chloride: 97 mmol/L — ABNORMAL LOW (ref 98–111)
Creatinine, Ser: 1.44 mg/dL — ABNORMAL HIGH (ref 0.44–1.00)
GFR, Estimated: 35 mL/min — ABNORMAL LOW (ref >60)
Glucose, Bld: 95 mg/dL (ref 70–99)
Phosphorus: 3.5 mg/dL (ref 2.5–4.6)
Potassium: 5.3 mmol/L — ABNORMAL HIGH (ref 3.5–5.1)
Sodium: 135 mmol/L (ref 135–145)

## 2021-04-12 LAB — POCT INR: INR: 1.7 — AB (ref 2.0–3.0)

## 2021-04-12 LAB — FERRITIN: Ferritin: 170 ng/mL (ref 11–307)

## 2021-04-12 MED ORDER — WARFARIN SODIUM 10 MG PO TABS: ORAL_TABLET | ORAL | 3 refills | Status: AC

## 2021-04-12 MED ORDER — METOPROLOL SUCCINATE ER 50 MG PO TB24: 50.0000 mg | ORAL_TABLET | Freq: Every day | ORAL | 5 refills | Status: AC

## 2021-04-12 MED ORDER — SYNTHROID 88 MCG PO TABS: ORAL_TABLET | ORAL | 5 refills | Status: AC

## 2021-04-12 MED ORDER — PRAVASTATIN SODIUM 40 MG PO TABS: 40.0000 mg | ORAL_TABLET | Freq: Every day | ORAL | 5 refills | Status: DC

## 2021-04-12 MED ORDER — POTASSIUM CHLORIDE CRYS ER 20 MEQ PO TBCR: 20.0000 meq | EXTENDED_RELEASE_TABLET | Freq: Every day | ORAL | 5 refills | Status: AC

## 2021-04-12 MED ORDER — EPOETIN ALFA-EPBX 40000 UNIT/ML IJ SOLN
40000.0000 [IU] | Freq: Once | INTRAMUSCULAR | Status: AC
  Administered 2021-04-12: 40000 [IU] via SUBCUTANEOUS
  Filled 2021-04-12: qty 1

## 2021-04-12 MED ORDER — ALLOPURINOL 100 MG PO TABS: 100.0000 mg | ORAL_TABLET | Freq: Every day | ORAL | 5 refills | Status: AC

## 2021-04-12 NOTE — Telephone Encounter (Signed)
Refill sent in

## 2021-04-12 NOTE — Telephone Encounter (Signed)
[name] [name] was the last one to [facility]. The pharmacy is asking that you send over refill on everything. I know that cardiology refills the coumadin. Do you want me to refill everything as requested?

## 2021-04-12 NOTE — Telephone Encounter (Signed)
Patient called said the pharmacy told her to call our office that Dr [name] needs to send over new prescription for all medicines since she was discharged from nursing home, please contact patient at [phone] or [phone]

## 2021-04-12 NOTE — Patient Instructions (Signed)
Take warfarin 1 1/2 tablets (15mg ) tonight then resume 1 tablet  (10mg ) daily except for 1/2 tablet (5mg )  on Monday, Wednesday and Friday. Order given to [place] in 1 wk

## 2021-04-12 NOTE — Patient Instructions (Signed)
[facility]  Discharge Instructions: Thank you for choosing [facility] to provide your oncology and hematology care.  If you have a lab appointment with the Cancer Center, please come in thru the Main Entrance and check in at the main information desk.  Wear comfortable clothing and clothing appropriate for easy access to any Portacath or PICC line.   We strive to give you quality time with your provider. You may need to reschedule your appointment if you arrive late (15 or more minutes).  Arriving late affects you and other patients whose appointments are after yours.  Also, if you miss three or more appointments without notifying the office, you may be dismissed from the clinic at the provider's discretion.      For prescription refill requests, have your pharmacy contact our office and allow 72 hours for refills to be completed.        To help prevent nausea and vomiting after your treatment, we encourage you to take your nausea medication as directed.  BELOW ARE SYMPTOMS THAT SHOULD BE REPORTED IMMEDIATELY: *FEVER GREATER THAN 100.4 F (38 C) OR HIGHER *CHILLS OR SWEATING *NAUSEA AND VOMITING THAT IS NOT CONTROLLED WITH YOUR NAUSEA MEDICATION *UNUSUAL SHORTNESS OF BREATH *UNUSUAL BRUISING OR BLEEDING *URINARY PROBLEMS (pain or burning when urinating, or frequent urination) *BOWEL PROBLEMS (unusual diarrhea, constipation, pain near the anus) TENDERNESS IN MOUTH AND THROAT WITH OR WITHOUT PRESENCE OF ULCERS (sore throat, sores in mouth, or a toothache) UNUSUAL RASH, SWELLING OR PAIN  UNUSUAL VAGINAL DISCHARGE OR ITCHING   Items with * indicate a potential emergency and should be followed up as soon as possible or go to the Emergency Department if any problems should occur.  Please show the CHEMOTHERAPY ALERT CARD or IMMUNOTHERAPY ALERT CARD at check-in to the Emergency Department and triage nurse.  Should you have questions after your visit or need to cancel  or reschedule your appointment, please contact [facility] [phone]  and follow the prompts.  Office hours are 8:00 a.m. to 4:30 p.m. Monday - Friday. Please note that voicemails left after 4:00 p.m. may not be returned until the following business day.  We are closed weekends and major holidays. You have access to a nurse at all times for urgent questions. Please call the main number to the clinic [phone] and follow the prompts.  For any non-urgent questions, you may also contact your provider using MyChart. We now offer e-Visits for anyone 18 and older to request care online for non-urgent symptoms. For details visit [email or web address].   Also download the MyChart app! Go to the app store, search "MyChart", open the app, select [facility], and log in with your MyChart username and password.  Due to Covid, a mask is required upon entering the hospital/clinic. If you do not have a mask, one will be given to you upon arrival. For doctor visits, patients may have 1 support person aged 18 or older with them. For treatment visits, patients cannot have anyone with them due to current Covid guidelines and our immunocompromised population.  

## 2021-04-12 NOTE — Telephone Encounter (Signed)
Message refilled and msg sent to nurse at cardiology on coumadin

## 2021-04-12 NOTE — Progress Notes (Signed)
Patient tolerated Retacrit 40,000 unit injection with no complaints voiced.  Site clean and dry with no bruising or swelling noted.  No complaints of pain.  Discharged with vital signs stable and no signs or symptoms of distress noted.  

## 2021-04-14 ENCOUNTER — Ambulatory Visit: Payer: Medicare Other | Admitting: Family Medicine

## 2021-04-14 DIAGNOSIS — N189 Chronic kidney disease, unspecified: Secondary | ICD-10-CM | POA: Diagnosis not present

## 2021-04-14 DIAGNOSIS — I5042 Chronic combined systolic (congestive) and diastolic (congestive) heart failure: Secondary | ICD-10-CM | POA: Diagnosis not present

## 2021-04-14 DIAGNOSIS — N1832 Chronic kidney disease, stage 3b: Secondary | ICD-10-CM | POA: Diagnosis not present

## 2021-04-14 DIAGNOSIS — D508 Other iron deficiency anemias: Secondary | ICD-10-CM | POA: Diagnosis not present

## 2021-04-14 DIAGNOSIS — D631 Anemia in chronic kidney disease: Secondary | ICD-10-CM | POA: Diagnosis not present

## 2021-04-14 DIAGNOSIS — I129 Hypertensive chronic kidney disease with stage 1 through stage 4 chronic kidney disease, or unspecified chronic kidney disease: Secondary | ICD-10-CM | POA: Diagnosis not present

## 2021-04-14 DIAGNOSIS — E875 Hyperkalemia: Secondary | ICD-10-CM | POA: Diagnosis not present

## 2021-04-17 DIAGNOSIS — R531 Weakness: Secondary | ICD-10-CM | POA: Diagnosis not present

## 2021-04-17 DIAGNOSIS — R5381 Other malaise: Secondary | ICD-10-CM | POA: Diagnosis not present

## 2021-04-18 ENCOUNTER — Ambulatory Visit (INDEPENDENT_AMBULATORY_CARE_PROVIDER_SITE_OTHER): Payer: Medicare Other

## 2021-04-18 DIAGNOSIS — Z950 Presence of cardiac pacemaker: Secondary | ICD-10-CM

## 2021-04-18 DIAGNOSIS — I5042 Chronic combined systolic (congestive) and diastolic (congestive) heart failure: Secondary | ICD-10-CM | POA: Diagnosis not present

## 2021-04-19 ENCOUNTER — Ambulatory Visit (INDEPENDENT_AMBULATORY_CARE_PROVIDER_SITE_OTHER): Payer: Medicare Other | Admitting: *Deleted

## 2021-04-19 ENCOUNTER — Other Ambulatory Visit: Payer: Self-pay

## 2021-04-19 ENCOUNTER — Telehealth: Payer: Self-pay | Admitting: Cardiovascular Disease

## 2021-04-19 ENCOUNTER — Encounter: Payer: Self-pay | Admitting: Orthopaedic Surgery

## 2021-04-19 ENCOUNTER — Ambulatory Visit (INDEPENDENT_AMBULATORY_CARE_PROVIDER_SITE_OTHER): Payer: Medicare Other | Admitting: Orthopaedic Surgery

## 2021-04-19 VITALS — BP 138/63 | HR 61 | Ht 67.0 in | Wt 193.0 lb

## 2021-04-19 DIAGNOSIS — M25561 Pain in right knee: Secondary | ICD-10-CM | POA: Diagnosis not present

## 2021-04-19 DIAGNOSIS — Z7901 Long term (current) use of anticoagulants: Secondary | ICD-10-CM | POA: Diagnosis not present

## 2021-04-19 DIAGNOSIS — E1122 Type 2 diabetes mellitus with diabetic chronic kidney disease: Secondary | ICD-10-CM | POA: Diagnosis not present

## 2021-04-19 DIAGNOSIS — M25512 Pain in left shoulder: Secondary | ICD-10-CM | POA: Diagnosis not present

## 2021-04-19 DIAGNOSIS — J441 Chronic obstructive pulmonary disease with (acute) exacerbation: Secondary | ICD-10-CM | POA: Diagnosis not present

## 2021-04-19 DIAGNOSIS — I130 Hypertensive heart and chronic kidney disease with heart failure and stage 1 through stage 4 chronic kidney disease, or unspecified chronic kidney disease: Secondary | ICD-10-CM | POA: Diagnosis not present

## 2021-04-19 DIAGNOSIS — N184 Chronic kidney disease, stage 4 (severe): Secondary | ICD-10-CM | POA: Diagnosis not present

## 2021-04-19 DIAGNOSIS — I5043 Acute on chronic combined systolic (congestive) and diastolic (congestive) heart failure: Secondary | ICD-10-CM | POA: Diagnosis not present

## 2021-04-19 DIAGNOSIS — N179 Acute kidney failure, unspecified: Secondary | ICD-10-CM | POA: Diagnosis not present

## 2021-04-19 DIAGNOSIS — G8929 Other chronic pain: Secondary | ICD-10-CM | POA: Diagnosis not present

## 2021-04-19 DIAGNOSIS — Z952 Presence of prosthetic heart valve: Secondary | ICD-10-CM

## 2021-04-19 DIAGNOSIS — Z5181 Encounter for therapeutic drug level monitoring: Secondary | ICD-10-CM | POA: Diagnosis not present

## 2021-04-19 LAB — POCT INR: INR: 1.9 — AB (ref 2.0–3.0)

## 2021-04-19 NOTE — Progress Notes (Signed)
EPIC Encounter for ICM Monitoring  Patient Name: [name] [name] is a 87 y.o. female Date: 04/19/2021 Primary Care Physican: Fayrene Helper, MD Primary Cardiologist: Croitoru Electrophysiologist: Santina Evans Pacing:  99.8%  03/16/2021 Weight: 190 lbs 198 lbs           Patient reports slight swelling in ankles and weight gain of 3-4 lbs.  She reports baseline weight is usually 193-194. Per 04/14/21 Nephrology note, pt needs iron infusions and decrease potassium dosage to qod.   Optivol thoracic impedance suggesting possible fluid accumulation since 04/05/2021.      Prescribed:  Furosemide 40 mg 1 tablet (40 mg total) by mouth daily.   Potassium 20 mEq take 1 tablet daily. Per 04/14/21 nephrology note, pt is taking Potassium ever other day   Labs: 04/12/2021 Creatinine 1.44, BUN 50, Potassium 5.3, Sodium 135, GFR 35 04/02/2021 Creatinine 1.57, BUN 60, Potassium 5.0, Sodium 138, GFR 32  02/07/2021 Creatinine 1.58, BUN 65, Potassium 4.6, Sodium 136, GFR 32 01/28/2021 Creatinine 1.72, BUN 89, Potassium 4.5, Sodium 136, GFR 29 01/27/2021 Creatinine 1.72, BUN 85, Potassium 5.1, Sodium 132, GFR 29 01/26/2021 Creatinine 1.63, BUN 87, Potassium 4.6, Sodium 134, GFR 31 01/25/2021 Creatinine 1.91, BUN 93, Potassium 4.4, Sodium 133, GFR 25  A complete set of results can be found in Results Review.   Recommendations:  Advised to limit salt intake.  Copy sent to Dr Ginnie Smart for review and recommendations if needed.    Follow-up plan: ICM clinic phone appointment on 04/26/2021 to recheck fluid levels.  91 day device clinic remote transmission 05/13/2021.    EP/Cardiology Office Visits:  06/06/2021 with Dr Sallyanne Kuster.     Copy of ICM check sent to Dr. Lovena Le.   3 month ICM trend: 04/17/2021.    1 Year ICM trend:       [name], RN 04/19/2021 7:59 AM

## 2021-04-19 NOTE — Telephone Encounter (Signed)
New message      PTINR    1.9  INR 22.4 PT

## 2021-04-19 NOTE — Patient Instructions (Signed)
Take warfarin 1 1/2 tablets (15mg ) tonight then resume 1 tablet  (10mg ) daily except for 1/2 tablet (5mg )  on Monday, Wednesday and Friday. Order given to [place] in 1 wk

## 2021-04-19 NOTE — Progress Notes (Signed)
Croitoru, Dani Gobble, MD  Bascom Biel Panda, RN Kidney function better than usual and K is high.  Please double the furosemide dose for 3 days, then return to usual dose.

## 2021-04-19 NOTE — Telephone Encounter (Signed)
Spoke with Janett Billow.  Coumadin orders given.  See anticoag note.

## 2021-04-19 NOTE — Progress Notes (Signed)
Spoke with patient.  Advised Dr Sallyanne Kuster recommended she take Furosemide 40 mg 1 tablets daily x 3 days.  After 3rd day, return to taking 1 40 mg tablet as prescribed.  She verbalized understanding of instructions.

## 2021-04-19 NOTE — Progress Notes (Signed)
PROCEDURE NOTE:  The patient request injection, verbal consent was obtained.  The left shoulder was prepped appropriately after time out was performed.   Sterile technique was observed and injection of 1 cc of Celestone 6 mg with several cc's of plain xylocaine. Anesthesia was provided by ethyl chloride and a 20-gauge needle was used to inject the shoulder area. A posterior approach was used.  The injection was tolerated well.  A band aid dressing was applied.  The patient was advised to apply ice later today and tomorrow to the injection sight as needed.   PROCEDURE NOTE:  The patient requests injections of the right knee , verbal consent was obtained.  The right knee was prepped appropriately after time out was performed.   Sterile technique was observed and injection of 1 cc of Celestone 6mg  with several cc's of plain xylocaine. Anesthesia was provided by ethyl chloride and a 20-gauge needle was used to inject the knee area. The injection was tolerated well.  A band aid dressing was applied.  The patient was advised to apply ice later today and tomorrow to the injection sight as needed.   See prn.  Call if any problem.  Precautions discussed.  Electronically Signed Sanjuana Kava, MD 7/26/20221:53 PM

## 2021-04-20 ENCOUNTER — Encounter (HOSPITAL_COMMUNITY)
Admission: RE | Admit: 2021-04-20 | Discharge: 2021-04-20 | Disposition: A | Discharge status: Home / Self Care | Payer: Medicare Other | Source: Ambulatory Visit | Attending: Nephrology | Admitting: Nephrology

## 2021-04-20 ENCOUNTER — Encounter (HOSPITAL_COMMUNITY): Payer: Self-pay

## 2021-04-20 DIAGNOSIS — D509 Iron deficiency anemia, unspecified: Secondary | ICD-10-CM | POA: Insufficient documentation

## 2021-04-20 MED ORDER — SODIUM CHLORIDE 0.9 % IV SOLN: Freq: Once | INTRAVENOUS | Status: AC

## 2021-04-20 MED ORDER — FERUMOXYTOL INJECTION 510 MG/17 ML
510.0000 mg | INTRAVENOUS | Status: DC
  Administered 2021-04-20: 510 mg via INTRAVENOUS
  Filled 2021-04-20: qty 17

## 2021-04-21 ENCOUNTER — Ambulatory Visit (INDEPENDENT_AMBULATORY_CARE_PROVIDER_SITE_OTHER): Payer: Medicare Other | Admitting: *Deleted

## 2021-04-21 DIAGNOSIS — I5022 Chronic systolic (congestive) heart failure: Secondary | ICD-10-CM | POA: Diagnosis not present

## 2021-04-21 DIAGNOSIS — I10 Essential (primary) hypertension: Secondary | ICD-10-CM | POA: Diagnosis not present

## 2021-04-21 NOTE — Chronic Care Management (AMB) (Signed)
Chronic Care Management   CCM RN Visit Note  04/21/2021 Name: [name] [name] MRN: [id number] DOB: 05/27/1932  Subjective: [name] [name] is a 87 y.o. year old female who is a primary care patient of Fayrene Helper, MD. The care management team was consulted for assistance with disease management and care coordination needs.    Engaged with patient by telephone for follow up visit in response to provider referral for case management and/or care coordination services.   Consent to Services:  The patient was given the following information about Chronic Care Management services today, agreed to services, and gave verbal consent: 1. CCM service includes personalized support from designated clinical staff supervised by the primary care provider, including individualized plan of care and coordination with other care providers 2. 24/7 contact phone numbers for assistance for urgent and routine care needs. 3. Service will only be billed when office clinical staff spend 20 minutes or more in a month to coordinate care. 4. Only one practitioner may furnish and bill the service in a calendar month. 5.The patient may stop CCM services at any time (effective at the end of the month) by phone call to the office staff. 6. The patient will be responsible for cost sharing (co-pay) of up to 20% of the service fee (after annual deductible is met). Patient agreed to services and consent obtained.  Patient agreed to services and verbal consent obtained.   Assessment: Review of patient past medical history, allergies, medications, health status, including review of consultants reports, laboratory and other test data, was performed as part of comprehensive evaluation and provision of chronic care management services.   SDOH (Social Determinants of Health) assessments and interventions performed:    CCM Care Plan  Allergies  Allergen Reactions   Penicillins Other (See Comments)    Bruise     Aspirin Other  (See Comments)    On coumadin   Fentanyl Dermatitis    Rash with patch    Niacin Itching    Outpatient Encounter Medications as of 04/21/2021  Medication Sig   acetaminophen (TYLENOL) 500 MG tablet Take 500 mg by mouth every 6 (six) hours as needed.   albuterol (PROVENTIL) (2.5 MG/3ML) 0.083% nebulizer solution Take 3 mLs (2.5 mg total) by nebulization in the morning and at bedtime.   albuterol (VENTOLIN HFA) 108 (90 Base) MCG/ACT inhaler Inhale 2 puffs into the lungs every 4 (four) hours as needed for wheezing or shortness of breath.   allopurinol (ZYLOPRIM) 100 MG tablet Take 1 tablet (100 mg total) by mouth daily.   Amino Acids-Protein Hydrolys (FEEDING SUPPLEMENT, PRO-STAT SUGAR FREE 64,) LIQD Take 30 mLs by mouth 2 (two) times daily between meals.   cholecalciferol (VITAMIN D3) 25 MCG (1000 UNIT) tablet Take 1 tablet (1,000 Units total) by mouth in the morning.   fluticasone (FLONASE) 50 MCG/ACT nasal spray Place 2 sprays into both nostrils daily.   fluticasone-salmeterol (ADVAIR) 250-50 MCG/ACT AEPB Inhale 1 puff into the lungs in the morning and at bedtime.   folic acid (FOLVITE) 1 MG tablet TAKE ONE TABLET BY MOUTH ONCE DAILY.   furosemide (LASIX) 40 MG tablet TAKE (1) TABLET BY MOUTH DAILY.   gabapentin (NEURONTIN) 300 MG capsule TAKE (1) CAPSULE BY MOUTH TWICE DAILY   hydrALAZINE (APRESOLINE) 10 MG tablet TAKE (1) TABLET BY MOUTH EVERY EIGHT HOURS.   isosorbide mononitrate (IMDUR) 30 MG 24 hr tablet TAKE 1/2 TABLET BY MOUTH ONCE DAILY.   levETIRAcetam (KEPPRA) 500 MG tablet TAKE (1) TABLET BY  MOUTH TWICE DAILY AT 9AM AND 9PM   metoprolol succinate (TOPROL-XL) 50 MG 24 hr tablet Take 1 tablet (50 mg total) by mouth daily. Take with or immediately following a meal.   ondansetron (ZOFRAN-ODT) 4 MG disintegrating tablet TAKE (1) TABLET BY MOUTH EVERY EIGHT HOURS AS NEEDED FOR NAUSEA OR VOMITING   OXYGEN Inhale 3 L into the lungs continuous.   polyethylene glycol (MIRALAX / GLYCOLAX)  17 g packet Take 17 g by mouth daily as needed.   potassium chloride SA (KLOR-CON) 20 MEQ tablet Take 1 tablet (20 mEq total) by mouth daily. (Patient taking differently: Take 20 mEq by mouth daily. 04/21/21- takes one QOD per pt- per nephrologist order)   pravastatin (PRAVACHOL) 40 MG tablet TAKE ONE TABLET BY MOUTH ONCE DAILY.   SYNTHROID 88 MCG tablet TAKE 1 TABLET BEFORE BREAKFAST.   UNABLE TO FIND Compression Stockings R-  Ankle  26.5cm   Calf 38cm  Leg  44cm L-  Ankle  27.5 cm   Calf 39cm  Leg  44cm (Patient taking differently: Compression Stockings R-  Ankle  26.5cm   Calf 38cm  Leg  44cm L-  Ankle  27.5 cm   Calf 39cm  Leg  44cm)   warfarin (COUMADIN) 10 MG tablet Take warfarin 38m tonight then continue 141mdaily except 17m64mn Mondays, Wednesdays and Fridays or as directed   BalTime WarnerENELEX) OINT Apply topically. topical, Every Shift, Apply to bilateral buttocks and sacrum qshift. (Patient not taking: Reported on 04/21/2021)   benzonatate (TESSALON) 100 MG capsule Take 1 capsule (100 mg total) by mouth 2 (two) times daily as needed for cough. (Patient not taking: Reported on 04/21/2021)   nitroGLYCERIN (NITROSTAT) 0.4 MG SL tablet DISSOLVE 1 TABLET SUBLINGUALLY AS NEEDED FOR CHEST PAIN, MAY REPEAT EVERY 5 MINUTES. AFTER 3 CALL 911. (Patient not taking: Reported on 04/21/2021)   NON FORMULARY Diet :mechanical soft diet with ground meats, NAS, cons CHO (Patient not taking: Reported on 04/21/2021)   predniSONE (DELTASONE) 20 MG tablet Take 2 tablets (40 mg total) by mouth daily. (Patient not taking: Reported on 04/21/2021)   No facility-administered encounter medications on file as of 04/21/2021.    Patient Active Problem List   Diagnosis Date Noted   Protein-calorie malnutrition, moderate (HCCGe[place]5/17/2022   Aortic atherosclerosis (HCC[place]5/05/2021   Other idiopathic peripheral autonomic neuropathy 01/31/2021   Anemia due to stage 4 chronic kidney disease (HCCBr[place]5/05/2021    Acute renal failure superimposed on stage 4 chronic kidney disease (HCC)    Acute respiratory failure with hypoxia (HCCBrand[place]4/26/2022   Acute respiratory failure with hypoxia and hypercapnia (HCC) 01/18/2021   Gait abnormality 08/24/2020   Chronic constipation 08/09/2020   Hypertensive heart and kidney disease with chronic systolic congestive heart failure and stage 4 chronic kidney disease (HCCLake of the Woods0/21/2021   Hyperlipidemia associated with type 2 diabetes mellitus (HCCTrim[place]0/21/2021   Seizure (HCCMidd[place]0/13/2021   Goals of care, counseling/discussion    Palliative care by specialist    Idiopathic hypotension    Arthritis of right knee 06/20/2020   Leg swelling 06/16/2020   Generalized osteoarthritis 05/09/2020   Primary osteoarthritis, left shoulder 07/40/98/1191Acute systolic CHF (congestive heart failure) (HCCOak[place]7/03/2020   Bilateral lower extremity edema    Right-sided epistaxis 03/28/2020   Acute pain of left shoulder 01/15/2020   AAA (abdominal aortic aneurysm) without rupture (HCCSullivan4/21/2021   COVID-19 virus detected 11/23/2019   Stage 3b chronic kidney disease (HCCNew Prague1/05/2019  Dyspnea 05/30/2019   Anemia 05/30/2019   Gout    Glaucoma    GERD (gastroesophageal reflux disease)    Physical debility 04/28/2019   Encounter for examination following treatment at hospital 11/03/2018   Hyponatremia 09/27/2018   H/O mitral valve replacement with mechanical valve 08/02/2018   Chronic right hip pain 04/16/2018   COPD with acute exacerbation ([place]) 11/15/2017   Trochanteric bursitis, right hip 03/07/2017   Acute on chronic combined systolic and diastolic heart failure ([place]) 11/16/2016   Persistent atrial fibrillation (HCC)    Nocturnal hypoxia 10/08/2016   Dependence on nocturnal oxygen therapy 10/08/2016   Hematuria 03/20/2016   S/P ICD (internal cardiac defibrillator) procedure 02/08/2015   Presence of cardiac pacemaker 02/08/2015   CHB (complete heart block) ([place]) 10/07/2014    Fatigue 07/20/2014   Absolute anemia 06/24/2014   Osteopenia 02/10/2014   Encounter for therapeutic drug monitoring 10/29/2013   OA (osteoarthritis) of knee 02/19/2013   Chronic pain of right knee 01/02/2013   Hemolytic anemia ([place]) 09/24/2012   High risk medication use 05/24/2012   COPD (chronic obstructive pulmonary disease) ([place]) 05/19/2012   Chronic anticoagulation, on coumadin for Mechanical AVR and MVR 04/01/2012   Cardiorenal syndrome with renal failure 03/22/2012   Hx of aortic valve replacement, mechanical 03/22/2012   S/P MVR (mitral valve replacement) 03/22/2012   Biventricular automatic implantable cardioverter defibrillator in situ 03/22/2012   CKD (chronic kidney disease), stage IV ([place]) 03/22/2012   Warfarin-induced coagulopathy ([place]) 03/21/2012   Iron deficiency 10/04/2011   Allergic rhinitis 02/14/2011   Mitral valve disease 07/15/2010   Sinus node dysfunction ([place]) 07/15/2010   Type 2 diabetes mellitus with hypoglycemia without coma ([place]) 07/15/2010   VARICOSE VEINS LOWER EXTREMITIES W/OTH COMPS 02/09/2009   Prediabetes 06/15/2008   Hypothyroidism, postsurgical 02/24/2008   Hyperlipidemia LDL goal <70 02/24/2008   Essential hypertension 02/24/2008   Non-ischemic cardiomyopathy with ICD 02/24/2008   Spondylosis 02/24/2008    Conditions to be addressed/monitored:CHF and HTN  Care Plan : Heart Failure (Adult)  Updates made by Kassie Mends, RN since 04/21/2021 12:00 AM     Problem: Symptom Exacerbation (Heart Failure)   Priority: High     Long-Range Goal: Symptom Exacerbation Prevented or Minimized   Start Date: 10/29/2020  Expected End Date: 10/22/2021  This Visit's Progress: On track  Recent Progress: On track  Priority: High  Note:   Current Barriers:  Knowledge deficits related to basic heart failure pathophysiology and self care management as evidenced by needing reinforcement of HF action plan, pt reports she is weighing daily now, (on occasion  misses a day) with weight 193 pounds , reports "breathing ok but I get short of breath sometimes", continues to use oxygen at 3L via [place] continuously, reports soreness in nose resolved, pt reports no knee pain today and had injection to right knee on 7/26.  Pt has prefilled packs for medications and reports she has everything, patient and CNA report nephrologist  (pt saw 7/21) decreased potassium to QOD at present. CNA assists patient to apply compression hose each morning as patient cannot independently perform this task, patient reports she does miss a day on occasion "because I like to let my legs have a rest" Nurse Case Manager Clinical Goal(s):  Over the next 30 days, patient will weigh self daily and record Over the next 90 days, patient will verbalize understanding of Heart Failure Action Plan and when to call doctor Interventions:  Collaboration with Fayrene Helper, MD regarding development  and update of comprehensive plan of care as evidenced by provider attestation and co-signature Inter-disciplinary care team collaboration (see longitudinal plan of care) Reviewd education on low sodium diet Reinforced Heart Failure Action Plan with patient and CNA and importance of knowing how you feel each day Reinforced importance of daily weight and recording Reviewed role of diuretics in prevention of fluid overload and importance of taking as prescribed Assessed that patient does have compression hose and is wearing daily most of the time- reinforced importance of wearing daily and elevating legs Reviewed medications with patient and encouraged compliance, timely refills Reviewed upcoming appointments with patient-  Dr. [name] 9/15, Annual Wellness Visit 8/17, Bloodwork 8/9,  Feraheme infusion 8/3 Reinforced oxygen safety, importance of no petroleum based products near oxygen (no vaseline) Reviewed energy conservation Patient Goals/Self-Care Activities Know how you are feeling each day and  continue to follow heart failure action plan if symptoms flare up Continue weighing and recording, ask CNA to help you know when to call the doctor- call for weight gain of 3 pounds in one day or 5 pounds in one week. Call RN Care Manager for any questions at [phone] It is important to wear compression hose as directed every day , take them off at night Take medications as prescribed and call for refills before running out You have follow up appointment with Dr. [name] on 9/15, Annual Wellness Visit on 8/17, bloodwork on 8/9 and Feraheme infusion on 8/3,  call doctor if you need a sooner appointment Use [place] Jelly if oxygen tubing is making your nose sore, do not use petroleum based products Alternate activity with rest and take your time  Follow Up Plan: Telephone follow up appointment with care management team member scheduled for:   05/19/2021      Care Plan : Hypertension (Adult)  Updates made by Kassie Mends, RN since 04/21/2021 12:00 AM     Problem: Hypertension (Hypertension)   Priority: Medium     Long-Range Goal: Hypertension Monitored   Start Date: 10/29/2020  Expected End Date: 10/22/2021  This Visit's Progress: Not on track  Recent Progress: On track  Priority: Medium  Note:   Objective:  Last practice recorded BP readings:  BP Readings from Last 3 Encounters:  10/27/20 (!) 153/77  10/26/20 (!) 145/75  09/28/20 133/72  Most recent eGFR/CrCl: No results found for: EGFR  No components found for: CRCL Current Barriers:  Knowledge Deficits related to basic understanding of hypertension pathophysiology and self care management as evidenced by continued reports of elevated blood pressures- pt reports in home aide checks blood pressure but not always checked daily, pt reports "I'm doing ok today", eating well, sleeping well, recently had injection in right knee (Dr. Luna Glasgow) and denies any pain today.  Pt does not always wear compression hose daily, reports " I wear  several times per week" Unable to perform ADLs independently- has [employer] home care aide that assists with checking blood pressure several times weekly, aide stays with pt 9 am- 1 pm Monday- Friday. Case Manager Clinical Goal(s):  Over the next 60 days, patient will demonstrate improved adherence to prescribed treatment plan for hypertension as evidenced by taking all medications as prescribed, monitoring and recording blood pressure as directed, adhering to low sodium/DASH diet Interventions:  Collaboration with Fayrene Helper, MD regarding development and update of comprehensive plan of care as evidenced by provider attestation and co-signature Inter-disciplinary care team collaboration (see longitudinal plan of care) Reminded patient to call RN care  manager with any questions, contact number provided Reviewed medications with patient and reinforced importance of compliance Reinforced with patient, caregiver (aide)  providing education and rationale, to monitor blood pressure at least 3 times per week and record (with assistance of CNA), calling PCP for findings outside established parameters, systolic >446, diastolic >28. Reinforced ow salt diet and importance of adherence Encouraged patient to walk some daily even if for a few minutes, alternate activity with rest Reviewed all upcoming appointments including primary care provider, blood work, feraheme infusion Encouraged patient (and caregiver) to wear compression hose daily and elevate legs when she can  Patient Goals/Self-Care Activities Continue checking blood pressure at least 3 times per week with the assistance of in home aide write blood pressure results in a log or diary Call primary care doctor for any readings outside of normal limits systolic >638, diastolic >17 Continue to follow a low salt diet- avoid fast food, salty chips, fries, etc. Continue to follow up with your doctor - Annual Wellness Visit 8/17 and primary care doctor  9/15/ Take all medications as prescribed Try to do some type of light exercise daily even if for a few minutes (walking is good) Follow Up Plan: Telephone follow up appointment with care management team member scheduled for: 05/19/2021     Plan:Telephone follow up appointment with care management team member scheduled for:  05/19/2021  Jacqlyn Larsen [facility], BSN RN Case Manager [facility] [phone]

## 2021-04-21 NOTE — Patient Instructions (Signed)
Visit Information  PATIENT GOALS:  Goals Addressed             This Visit's Progress    Track and Manage My Blood Pressure-Hypertension       Timeframe:  Long-Range Goal Priority:  Medium Start Date:      10/29/2020                       Expected End Date:  04/28/2021                     Follow Up Date 05/19/2021   Continue checking blood pressure at least 3 times per week with the assistance of in home aide write blood pressure results in a log or diary Call primary care doctor for any readings outside of normal limits systolic >272, diastolic >53 Continue to follow a low salt diet- avoid fast food, salty chips, fries, etc. Continue to follow up with your doctor - Annual Wellness Visit 8/17 and primary care doctor 9/15/ Take all medications as prescribed Try to do some type of light exercise daily even if for a few minutes (walking is good)   Why is this important?   You won't feel high blood pressure, but it can still hurt your blood vessels.  High blood pressure can cause heart or kidney problems. It can also cause a stroke.  Making lifestyle changes like losing a little weight or eating less salt will help.  Checking your blood pressure at home and at different times of the day can help to control blood pressure.  If the doctor prescribes medicine remember to take it the way the doctor ordered.  Call the office if you cannot afford the medicine or if there are questions about it.         Track and Manage Symptoms-Heart Failure       Timeframe:  Long-Range Goal Priority:  High Start Date:       10/29/2020                      Expected End Date:   04/28/2021                    Follow Up Date 05/19/2021   Know how you are feeling each day and continue to follow heart failure action plan if symptoms flare up Continue weighing and recording, ask CNA to help you know when to call the doctor- call for weight gain of 3 pounds in one day or 5 pounds in one week. Call RN Care Manager  for any questions at [phone] It is important to wear compression hose as directed every day , take them off at night Take medications as prescribed and call for refills before running out You have follow up appointment with Dr. [name] on 9/15, Annual Wellness Visit on 8/17, bloodwork on 8/9 and Feraheme infusion on 8/3,  call doctor if you need a sooner appointment Use [place] Jelly if oxygen tubing is making your nose sore, do not use petroleum based products Alternate activity with rest and take your time   Why is this important?   You will be able to handle your symptoms better if you keep track of them.  Making some simple changes to your lifestyle will help.  Eating healthy is one thing you can do to take good care of yourself.            The patient verbalized  understanding of instructions, educational materials, and care plan provided today and declined offer to receive copy of patient instructions, educational materials, and care plan.   Telephone follow up appointment with care management team member scheduled for:  05/19/2021  Jacqlyn Larsen [facility], BSN RN Case Manager [facility] [phone]

## 2021-04-26 ENCOUNTER — Telehealth: Payer: Self-pay | Admitting: *Deleted

## 2021-04-26 ENCOUNTER — Ambulatory Visit (INDEPENDENT_AMBULATORY_CARE_PROVIDER_SITE_OTHER): Payer: Medicare Other

## 2021-04-26 ENCOUNTER — Ambulatory Visit (INDEPENDENT_AMBULATORY_CARE_PROVIDER_SITE_OTHER): Payer: Medicare Other | Admitting: *Deleted

## 2021-04-26 DIAGNOSIS — N184 Chronic kidney disease, stage 4 (severe): Secondary | ICD-10-CM | POA: Diagnosis not present

## 2021-04-26 DIAGNOSIS — J441 Chronic obstructive pulmonary disease with (acute) exacerbation: Secondary | ICD-10-CM | POA: Diagnosis not present

## 2021-04-26 DIAGNOSIS — Z5181 Encounter for therapeutic drug level monitoring: Secondary | ICD-10-CM | POA: Diagnosis not present

## 2021-04-26 DIAGNOSIS — I5043 Acute on chronic combined systolic (congestive) and diastolic (congestive) heart failure: Secondary | ICD-10-CM | POA: Diagnosis not present

## 2021-04-26 DIAGNOSIS — Z952 Presence of prosthetic heart valve: Secondary | ICD-10-CM

## 2021-04-26 DIAGNOSIS — Z7901 Long term (current) use of anticoagulants: Secondary | ICD-10-CM | POA: Diagnosis not present

## 2021-04-26 DIAGNOSIS — Z950 Presence of cardiac pacemaker: Secondary | ICD-10-CM

## 2021-04-26 DIAGNOSIS — I5042 Chronic combined systolic (congestive) and diastolic (congestive) heart failure: Secondary | ICD-10-CM

## 2021-04-26 DIAGNOSIS — N179 Acute kidney failure, unspecified: Secondary | ICD-10-CM | POA: Diagnosis not present

## 2021-04-26 DIAGNOSIS — E1122 Type 2 diabetes mellitus with diabetic chronic kidney disease: Secondary | ICD-10-CM | POA: Diagnosis not present

## 2021-04-26 DIAGNOSIS — I130 Hypertensive heart and chronic kidney disease with heart failure and stage 1 through stage 4 chronic kidney disease, or unspecified chronic kidney disease: Secondary | ICD-10-CM | POA: Diagnosis not present

## 2021-04-26 LAB — POCT INR: INR: 1.6 — AB (ref 2.0–3.0)

## 2021-04-26 NOTE — Patient Instructions (Signed)
Increase warfarin to 1 tablet  (10mg ) daily except for 1/2 tablet (5mg )  on Monday and Friday. Order given to [place] in 1 wk

## 2021-04-26 NOTE — Telephone Encounter (Signed)
Spoke with [facility].  Coumadin orders given.  See anticoag note.

## 2021-04-26 NOTE — Telephone Encounter (Signed)
INR   1.6 PT      19.5  Please call Jessica with [facility].    [phone]

## 2021-04-27 ENCOUNTER — Encounter (HOSPITAL_COMMUNITY): Payer: Self-pay

## 2021-04-27 ENCOUNTER — Other Ambulatory Visit (HOSPITAL_COMMUNITY)
Admission: RE | Admit: 2021-04-27 | Discharge: 2021-04-27 | Disposition: A | Discharge status: Home / Self Care | Payer: Medicare Other | Source: Ambulatory Visit | Attending: Nephrology | Admitting: Nephrology

## 2021-04-27 ENCOUNTER — Encounter (HOSPITAL_COMMUNITY)
Admission: RE | Admit: 2021-04-27 | Discharge: 2021-04-27 | Disposition: A | Discharge status: Home / Self Care | Payer: Medicare Other | Source: Ambulatory Visit | Attending: Nephrology | Admitting: Nephrology

## 2021-04-27 ENCOUNTER — Other Ambulatory Visit: Payer: Self-pay

## 2021-04-27 DIAGNOSIS — E875 Hyperkalemia: Secondary | ICD-10-CM | POA: Insufficient documentation

## 2021-04-27 DIAGNOSIS — D508 Other iron deficiency anemias: Secondary | ICD-10-CM | POA: Insufficient documentation

## 2021-04-27 DIAGNOSIS — N189 Chronic kidney disease, unspecified: Secondary | ICD-10-CM | POA: Insufficient documentation

## 2021-04-27 DIAGNOSIS — N1832 Chronic kidney disease, stage 3b: Secondary | ICD-10-CM | POA: Insufficient documentation

## 2021-04-27 DIAGNOSIS — D631 Anemia in chronic kidney disease: Secondary | ICD-10-CM | POA: Insufficient documentation

## 2021-04-27 DIAGNOSIS — D509 Iron deficiency anemia, unspecified: Secondary | ICD-10-CM | POA: Insufficient documentation

## 2021-04-27 DIAGNOSIS — I129 Hypertensive chronic kidney disease with stage 1 through stage 4 chronic kidney disease, or unspecified chronic kidney disease: Secondary | ICD-10-CM | POA: Insufficient documentation

## 2021-04-27 LAB — POTASSIUM: Potassium: 4.8 mmol/L (ref 3.5–5.1)

## 2021-04-27 MED ORDER — SODIUM CHLORIDE 0.9 % IV SOLN: Freq: Once | INTRAVENOUS | Status: AC

## 2021-04-27 MED ORDER — SODIUM CHLORIDE 0.9 % IV SOLN
510.0000 mg | Freq: Once | INTRAVENOUS | Status: AC
  Administered 2021-04-27: 510 mg via INTRAVENOUS
  Filled 2021-04-27: qty 510

## 2021-04-29 NOTE — Progress Notes (Signed)
EPIC Encounter for ICM Monitoring  Patient Name: [name] [name] is a 87 y.o. female Date: 04/29/2021 Primary Care Physican: Fayrene Helper, MD Primary Cardiologist: [place] Electrophysiologist: Santina Evans Pacing:  99.8%  04/29/2021 Weight: 192 lbs          Patient reports swelling of ankles has resolved.    Optivol thoracic impedance suggesting fluid levels returned to normal after taking extra Furosemide x 3 days as instructed.      Prescribed:  Furosemide 40 mg 1 tablet (40 mg total) by mouth daily.   Potassium 20 mEq take 1 tablet daily. Per 04/14/21 nephrology note, pt is taking Potassium ever other day   Labs: 04/12/2021 Creatinine 1.44, BUN 50, Potassium 5.3, Sodium 135, GFR 35 04/02/2021 Creatinine 1.57, BUN 60, Potassium 5.0, Sodium 138, GFR 32  02/07/2021 Creatinine 1.58, BUN 65, Potassium 4.6, Sodium 136, GFR 32 01/28/2021 Creatinine 1.72, BUN 89, Potassium 4.5, Sodium 136, GFR 29 01/27/2021 Creatinine 1.72, BUN 85, Potassium 5.1, Sodium 132, GFR 29 01/26/2021 Creatinine 1.63, BUN 87, Potassium 4.6, Sodium 134, GFR 31 01/25/2021 Creatinine 1.91, BUN 93, Potassium 4.4, Sodium 133, GFR 25  A complete set of results can be found in Results Review.   Recommendations:  No changes and encouraged to call if experiencing any fluid symptoms.   Follow-up plan: ICM clinic phone appointment on 05/23/2021.  91 day device clinic remote transmission 05/13/2021.    EP/Cardiology Office Visits:  06/06/2021 with Dr Sallyanne Kuster.     Copy of ICM check sent to Dr. Lovena Le.   3 month ICM trend: 04/25/2021.    1 Year ICM trend:       [name], RN 04/29/2021 4:02 PM

## 2021-05-02 ENCOUNTER — Other Ambulatory Visit: Payer: Self-pay

## 2021-05-03 ENCOUNTER — Telehealth: Payer: Self-pay | Admitting: Cardiovascular Disease

## 2021-05-03 ENCOUNTER — Other Ambulatory Visit: Payer: Self-pay

## 2021-05-03 ENCOUNTER — Inpatient Hospital Stay (HOSPITAL_COMMUNITY): Payer: Medicare Other

## 2021-05-03 ENCOUNTER — Ambulatory Visit (INDEPENDENT_AMBULATORY_CARE_PROVIDER_SITE_OTHER): Payer: Medicare Other | Admitting: *Deleted

## 2021-05-03 ENCOUNTER — Inpatient Hospital Stay (HOSPITAL_COMMUNITY): Payer: Medicare Other | Attending: Hematology

## 2021-05-03 VITALS — BP 144/79 | HR 60 | Temp 97.8°F | Resp 20 | Ht 67.0 in | Wt 193.0 lb

## 2021-05-03 DIAGNOSIS — D631 Anemia in chronic kidney disease: Secondary | ICD-10-CM | POA: Insufficient documentation

## 2021-05-03 DIAGNOSIS — N184 Chronic kidney disease, stage 4 (severe): Secondary | ICD-10-CM

## 2021-05-03 DIAGNOSIS — Z7901 Long term (current) use of anticoagulants: Secondary | ICD-10-CM | POA: Diagnosis not present

## 2021-05-03 DIAGNOSIS — N179 Acute kidney failure, unspecified: Secondary | ICD-10-CM | POA: Diagnosis not present

## 2021-05-03 DIAGNOSIS — N181 Chronic kidney disease, stage 1: Secondary | ICD-10-CM | POA: Diagnosis not present

## 2021-05-03 DIAGNOSIS — D649 Anemia, unspecified: Secondary | ICD-10-CM

## 2021-05-03 DIAGNOSIS — Z5181 Encounter for therapeutic drug level monitoring: Secondary | ICD-10-CM

## 2021-05-03 DIAGNOSIS — Z952 Presence of prosthetic heart valve: Secondary | ICD-10-CM | POA: Diagnosis not present

## 2021-05-03 DIAGNOSIS — I130 Hypertensive heart and chronic kidney disease with heart failure and stage 1 through stage 4 chronic kidney disease, or unspecified chronic kidney disease: Secondary | ICD-10-CM | POA: Diagnosis not present

## 2021-05-03 DIAGNOSIS — J441 Chronic obstructive pulmonary disease with (acute) exacerbation: Secondary | ICD-10-CM | POA: Diagnosis not present

## 2021-05-03 DIAGNOSIS — E1122 Type 2 diabetes mellitus with diabetic chronic kidney disease: Secondary | ICD-10-CM | POA: Diagnosis not present

## 2021-05-03 DIAGNOSIS — I5043 Acute on chronic combined systolic (congestive) and diastolic (congestive) heart failure: Secondary | ICD-10-CM | POA: Diagnosis not present

## 2021-05-03 LAB — CBC WITH DIFFERENTIAL/PLATELET
Abs Immature Granulocytes: 0.04 10*3/uL (ref 0.00–0.07)
Basophils Absolute: 0 10*3/uL (ref 0.0–0.1)
Basophils Relative: 0 %
Eosinophils Absolute: 0 10*3/uL (ref 0.0–0.5)
Eosinophils Relative: 0 %
HCT: 33 % — ABNORMAL LOW (ref 36.0–46.0)
Hemoglobin: 9.8 g/dL — ABNORMAL LOW (ref 12.0–15.0)
Immature Granulocytes: 1 %
Lymphocytes Relative: 9 %
Lymphs Abs: 0.6 10*3/uL — ABNORMAL LOW (ref 0.7–4.0)
MCH: 28.2 pg (ref 26.0–34.0)
MCHC: 29.7 g/dL — ABNORMAL LOW (ref 30.0–36.0)
MCV: 95.1 fL (ref 80.0–100.0)
Monocytes Absolute: 0.5 10*3/uL (ref 0.1–1.0)
Monocytes Relative: 8 %
Neutro Abs: 5.5 10*3/uL (ref 1.7–7.7)
Neutrophils Relative %: 82 %
Platelets: 162 10*3/uL (ref 150–400)
RBC: 3.47 MIL/uL — ABNORMAL LOW (ref 3.87–5.11)
RDW: 20.4 % — ABNORMAL HIGH (ref 11.5–15.5)
WBC: 6.6 10*3/uL (ref 4.0–10.5)
nRBC: 0 % (ref 0.0–0.2)

## 2021-05-03 LAB — POCT INR: INR: 2.3 (ref 2.0–3.0)

## 2021-05-03 IMAGING — DX DG CHEST 2V
2 series · 2 of 2 positions shown · non-contrast
Comparison: 05/31/2020

CLINICAL DATA: 88-year-old female with a history of stage IIIB
chronic kidney disease

EXAM:
CHEST - 2 VIEW

[chest lat]
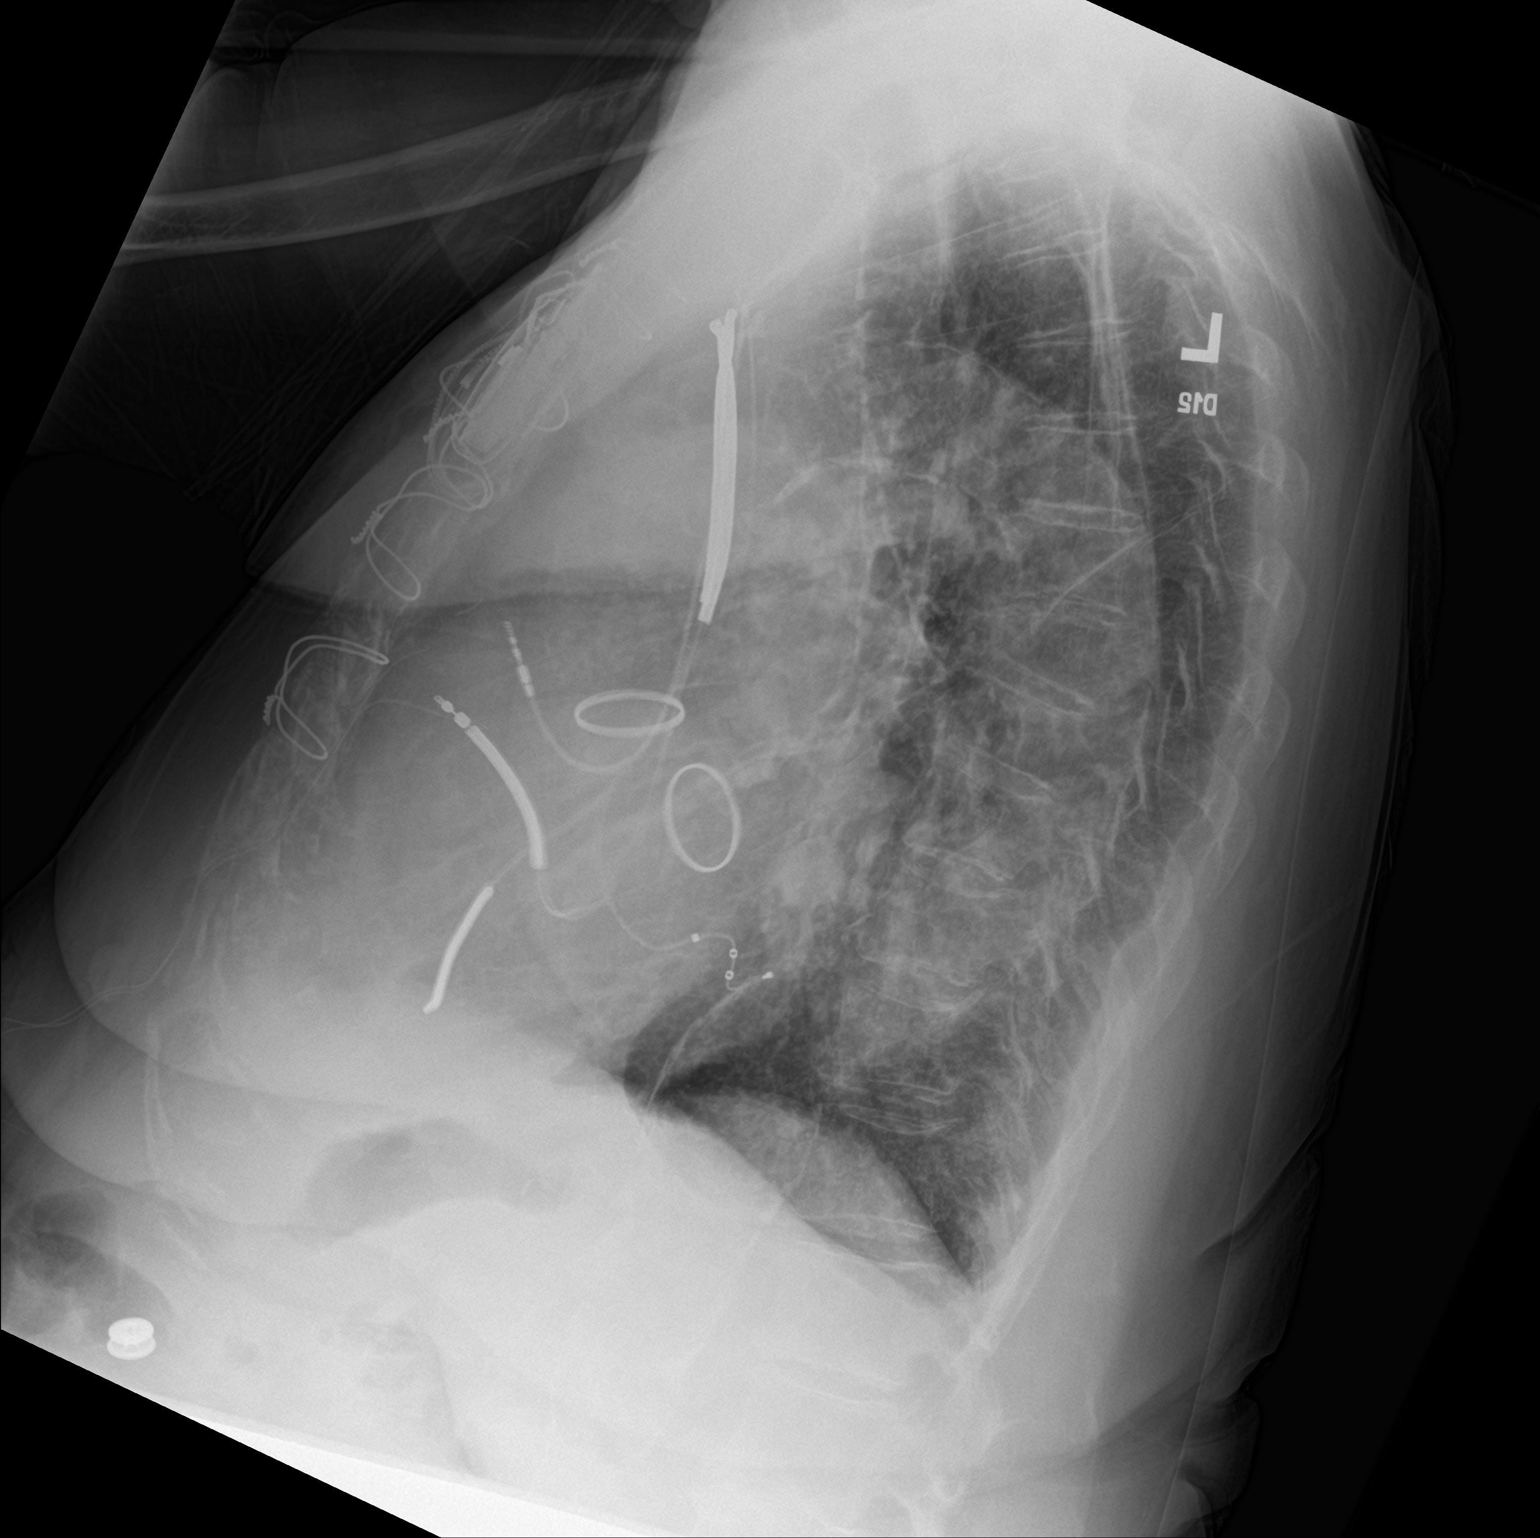

[chest ap]
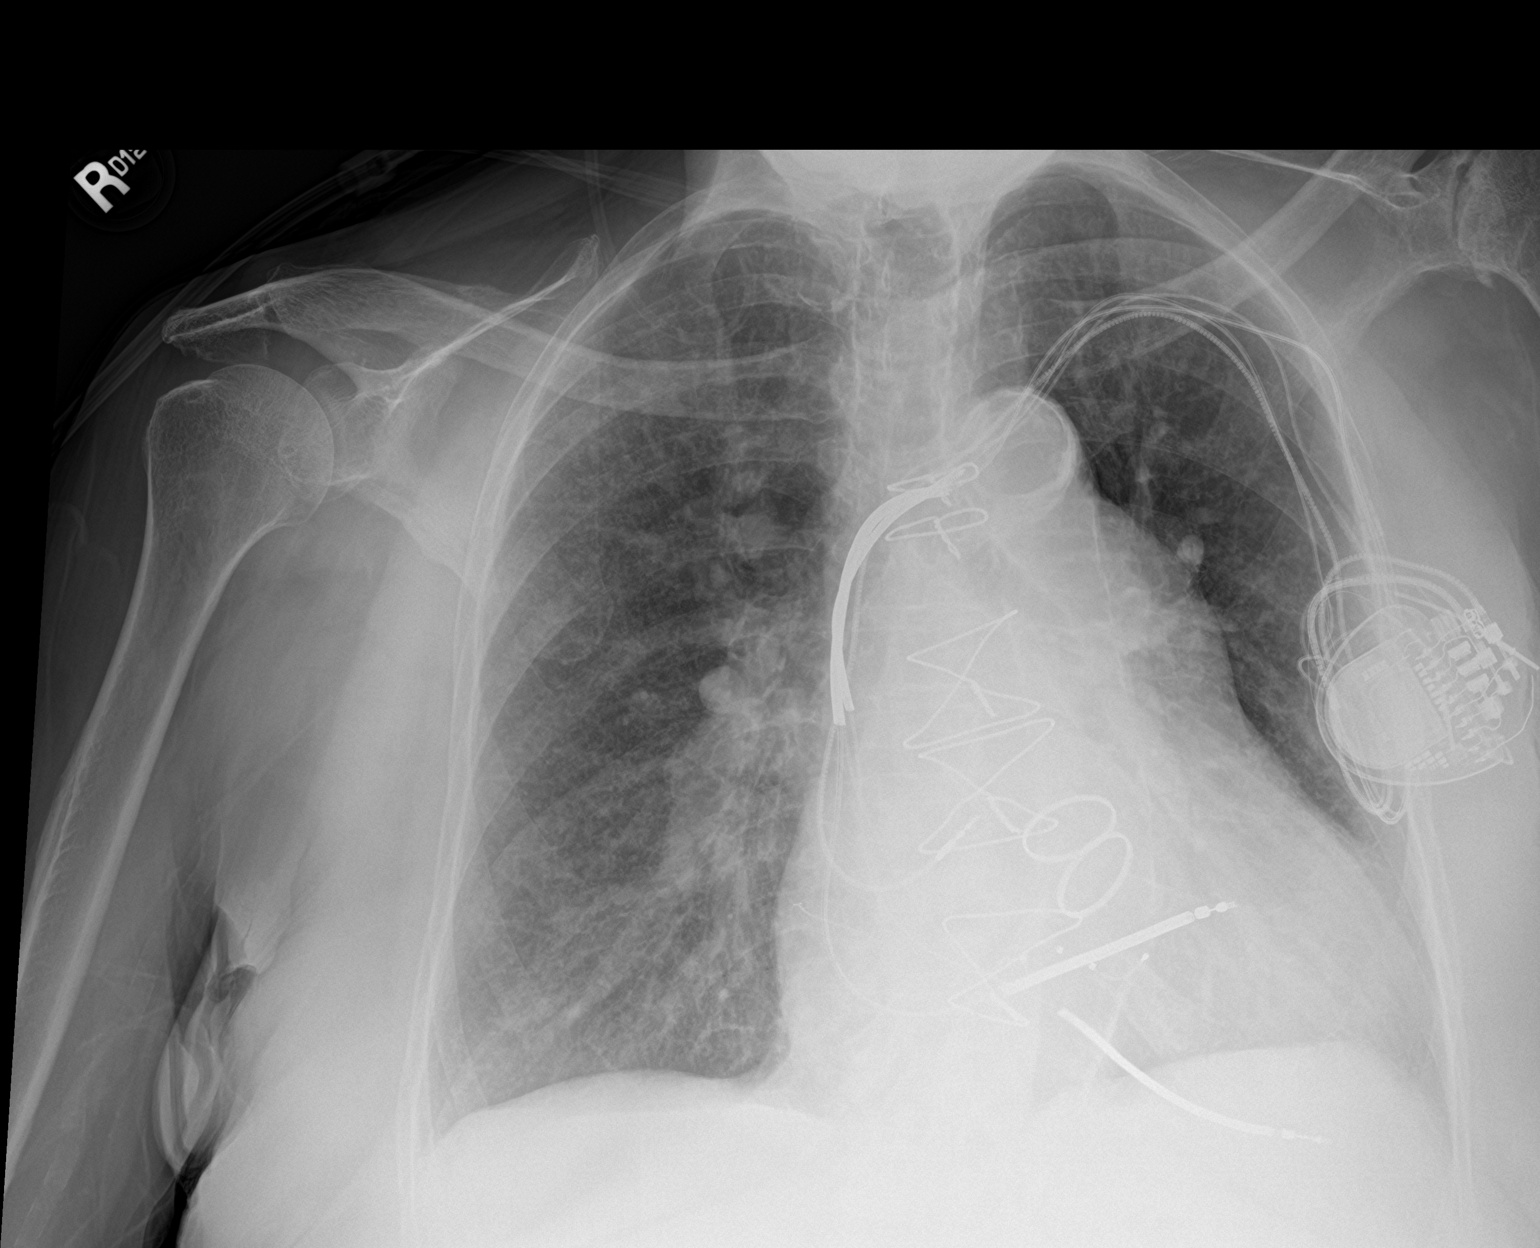

[2 of 2 positions shown; findings below may reference images not displayed]

FINDINGS: Cardiomediastinal silhouette unchanged in size and contour. Surgical
changes of median sternotomy, mitral valve annuloplasty, aortic
valve repair. Unchanged appearance of left chest wall cardiac pacing
device/AICD.

Coarsened interstitial markings bilateral lungs, similar to the
comparison. Interstitial opacities. No confluent airspace disease.

No large pleural effusion, no pneumothorax.

Degenerative changes of the spine. Osteopenia. Configuration of the
thoracic vertebral bodies unchanged from the comparison, with mild
wedge deformity of midthoracic vertebral body.
IMPRESSION: Early pulmonary edema versus chronic changes.

Similar appearance of cardiomegaly, left chest wall AICD, surgical
changes of median sternotomy and CABG with mitral annuloplasty and
aortic valve repair.

## 2021-05-03 MED ORDER — EPOETIN ALFA-EPBX 40000 UNIT/ML IJ SOLN
40000.0000 [IU] | Freq: Once | INTRAMUSCULAR | Status: AC
  Administered 2021-05-03: 40000 [IU] via SUBCUTANEOUS

## 2021-05-03 MED ORDER — EPOETIN ALFA-EPBX 40000 UNIT/ML IJ SOLN
INTRAMUSCULAR | Status: AC
  Filled 2021-05-03: qty 1

## 2021-05-03 NOTE — Telephone Encounter (Signed)
LM for [facility] with coumadin instructions.  See coumadin note.

## 2021-05-03 NOTE — Patient Instructions (Signed)
Continue warfarin to 1 tablet  (10mg ) daily except for 1/2 tablet (5mg )  on Monday and Friday. Order given to [place] in 1 wk

## 2021-05-03 NOTE — Progress Notes (Signed)
Lil Lepage presents today for Retacrit injection per the provider's orders.  Stable during administration without incident; injection site WNL; see MAR for injection details.  Patient tolerated procedure well and without incident.  No questions or complaints noted at this time. Discharge from clinic ambulatory in stable condition.  Alert and oriented X 3.  Follow up with [facility] as scheduled.

## 2021-05-03 NOTE — Patient Instructions (Signed)
[facility]  Discharge Instructions: Thank you for choosing [facility] to provide your oncology and hematology care.  If you have a lab appointment with the Cancer Center, please come in thru the Main Entrance and check in at the main information desk.  Wear comfortable clothing and clothing appropriate for easy access to any Portacath or PICC line.   We strive to give you quality time with your provider. You may need to reschedule your appointment if you arrive late (15 or more minutes).  Arriving late affects you and other patients whose appointments are after yours.  Also, if you miss three or more appointments without notifying the office, you may be dismissed from the clinic at the provider's discretion.      For prescription refill requests, have your pharmacy contact our office and allow 72 hours for refills to be completed.    Today you received the following chemotherapy and/or immunotherapy agents Retacrit      To help prevent nausea and vomiting after your treatment, we encourage you to take your nausea medication as directed.  BELOW ARE SYMPTOMS THAT SHOULD BE REPORTED IMMEDIATELY: *FEVER GREATER THAN 100.4 F (38 C) OR HIGHER *CHILLS OR SWEATING *NAUSEA AND VOMITING THAT IS NOT CONTROLLED WITH YOUR NAUSEA MEDICATION *UNUSUAL SHORTNESS OF BREATH *UNUSUAL BRUISING OR BLEEDING *URINARY PROBLEMS (pain or burning when urinating, or frequent urination) *BOWEL PROBLEMS (unusual diarrhea, constipation, pain near the anus) TENDERNESS IN MOUTH AND THROAT WITH OR WITHOUT PRESENCE OF ULCERS (sore throat, sores in mouth, or a toothache) UNUSUAL RASH, SWELLING OR PAIN  UNUSUAL VAGINAL DISCHARGE OR ITCHING   Items with * indicate a potential emergency and should be followed up as soon as possible or go to the Emergency Department if any problems should occur.  Please show the CHEMOTHERAPY ALERT CARD or IMMUNOTHERAPY ALERT CARD at check-in to the Emergency  Department and triage nurse.  Should you have questions after your visit or need to cancel or reschedule your appointment, please contact [facility] [phone]  and follow the prompts.  Office hours are 8:00 a.m. to 4:30 p.m. Monday - Friday. Please note that voicemails left after 4:00 p.m. may not be returned until the following business day.  We are closed weekends and major holidays. You have access to a nurse at all times for urgent questions. Please call the main number to the clinic [phone] and follow the prompts.  For any non-urgent questions, you may also contact your provider using MyChart. We now offer e-Visits for anyone 18 and older to request care online for non-urgent symptoms. For details visit [email or web address].   Also download the MyChart app! Go to the app store, search "MyChart", open the app, select [facility], and log in with your MyChart username and password.  Due to Covid, a mask is required upon entering the hospital/clinic. If you do not have a mask, one will be given to you upon arrival. For doctor visits, patients may have 1 support person aged 18 or older with them. For treatment visits, patients cannot have anyone with them due to current Covid guidelines and our immunocompromised population.  

## 2021-05-03 NOTE — Telephone Encounter (Signed)
INR is 2.3  PT 27.4

## 2021-05-04 DIAGNOSIS — Z20822 Contact with and (suspected) exposure to covid-19: Secondary | ICD-10-CM | POA: Diagnosis not present

## 2021-05-07 IMAGING — DX DG CHEST 1V PORT
1 series · 1 of 1 positions shown · non-contrast
Comparison: Radiograph 06/25/2020, additional priors. CT
05/27/2020.

CLINICAL DATA: Shortness of breath.  Pulmonary edema.

EXAM:
PORTABLE CHEST 1 VIEW

[chest ap grid]
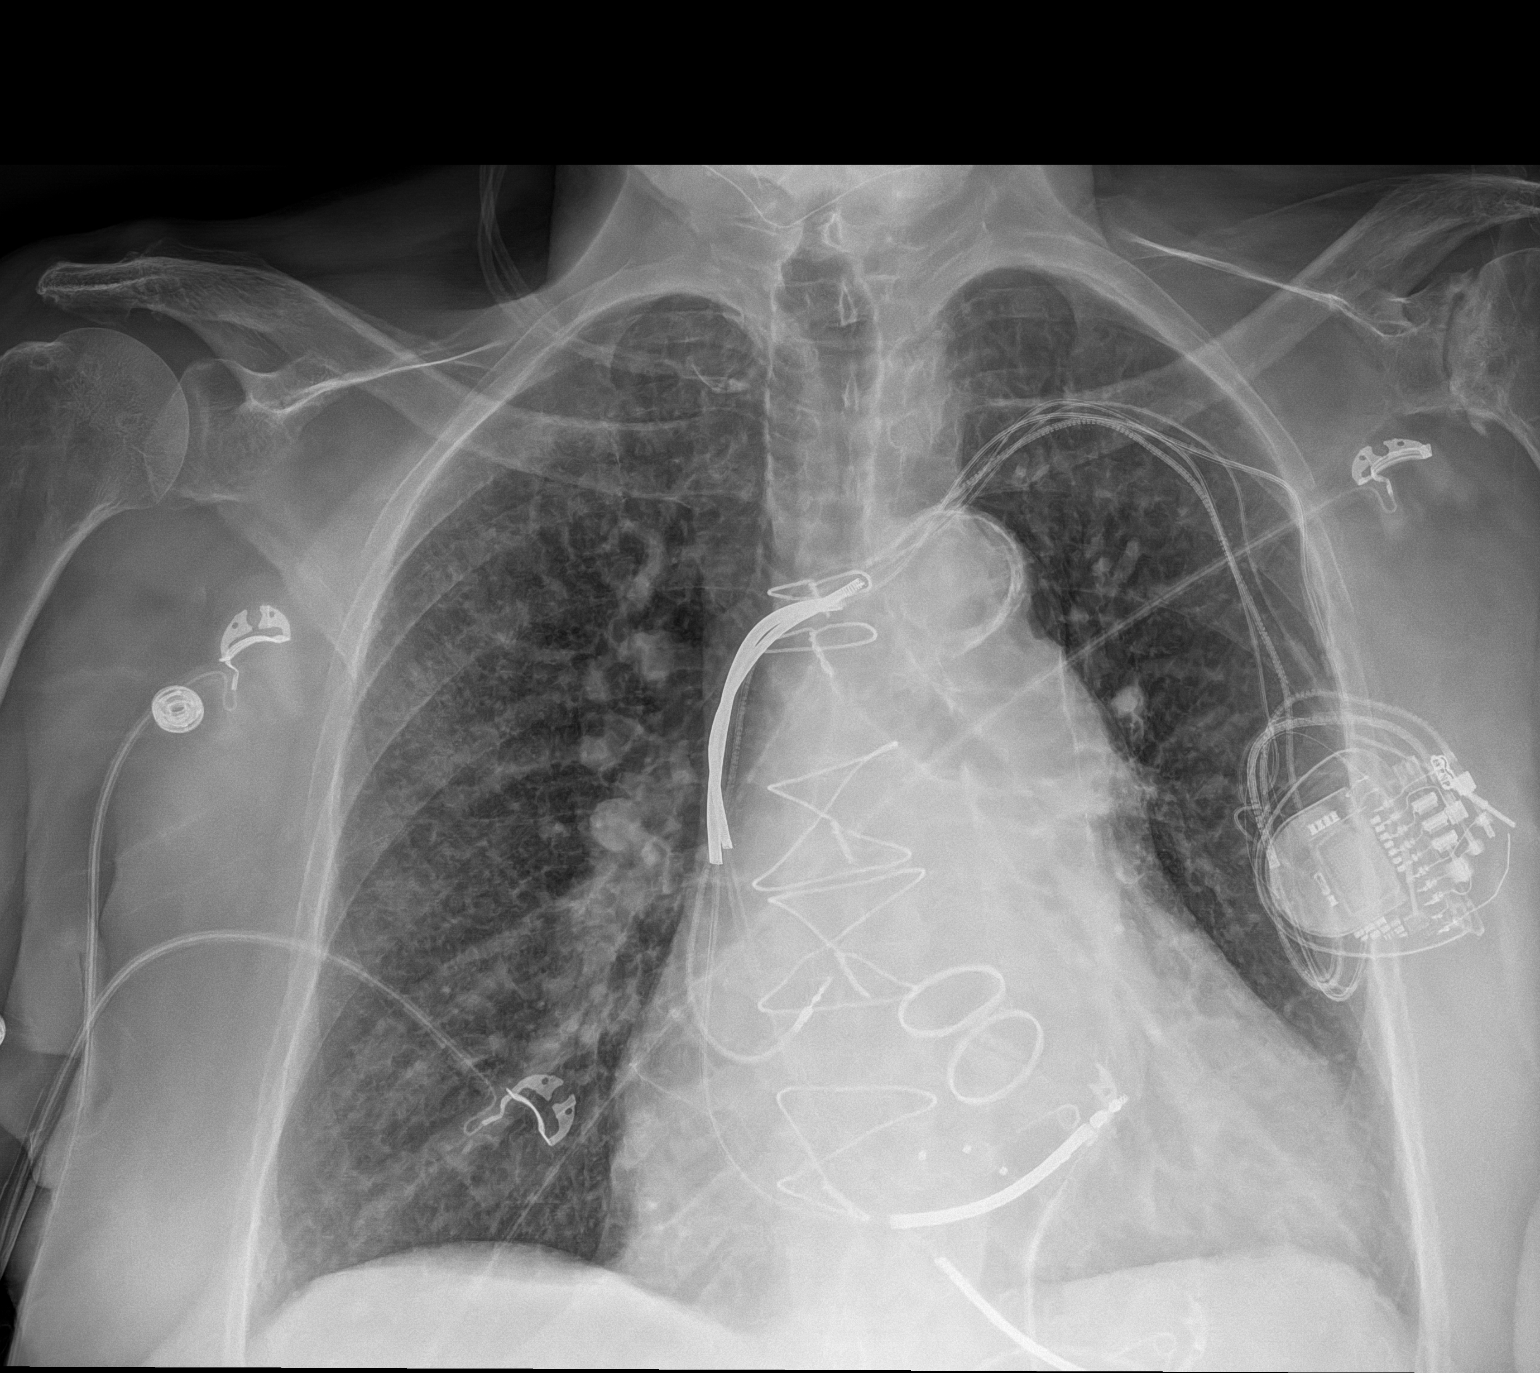

[1 of 1 positions shown; findings below may reference images not displayed]

FINDINGS: Stable cardiomegaly. Unchanged mediastinal contours. Aortic
atherosclerosis. Pacemaker in place with prosthetic cardiac valves.
Similar appearance of interstitial coarsening from prior exam. No
progression. No focal airspace disease. No pleural fluid. No
pneumothorax. Stable osseous structures.
IMPRESSION: 1. Stable cardiomegaly.
2. Unchanged interstitial coarsening, this is similar to multiple
prior exams. Septal thickening on prior CT suggests an element of
pulmonary edema. There is background emphysema on CT.
3. No new or acute airspace disease

## 2021-05-08 IMAGING — DX DG CHEST 1V PORT
1 series · 1 of 1 positions shown · non-contrast
Comparison: 06/29/2020

CLINICAL DATA: Congestive heart failure exacerbation.

EXAM:
PORTABLE CHEST 1 VIEW

[chest ap]
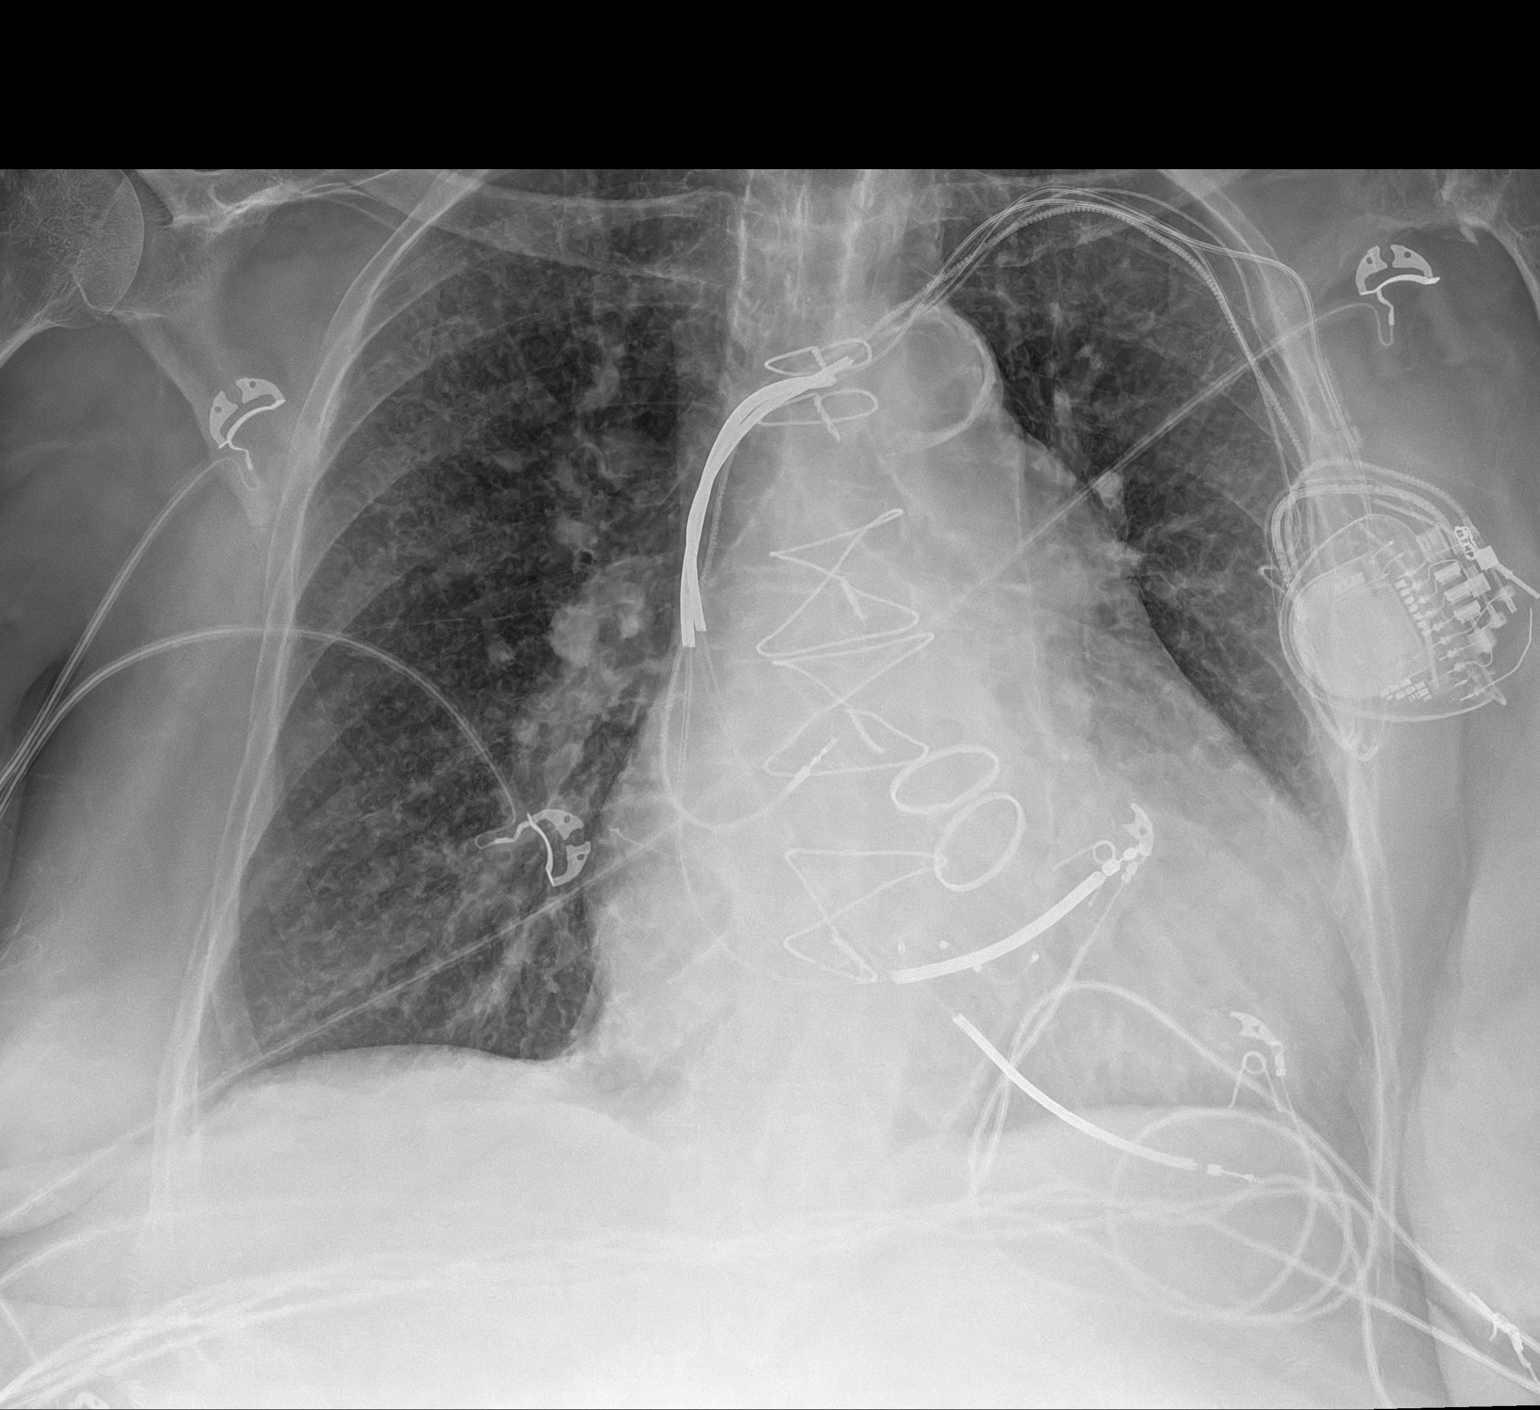

[1 of 1 positions shown; findings below may reference images not displayed]

FINDINGS: Heart is enlarged. Valve replacements are noted. Pacing for related
wires are stable. Atherosclerotic calcifications are noted at the
aorta.

Diffuse interstitial prominence is stable mostly chronic. Effusions
are present. No airspace consolidation is present. Degenerative
changes are noted at the left shoulder.
IMPRESSION: 1. Cardiomegaly without failure.
2. Stable chronic interstitial prominence compatible with edema.
3. Small bilateral pleural effusions are stable.

## 2021-05-09 ENCOUNTER — Other Ambulatory Visit: Payer: Self-pay | Admitting: Family Medicine

## 2021-05-10 ENCOUNTER — Telehealth: Payer: Self-pay | Admitting: *Deleted

## 2021-05-10 ENCOUNTER — Ambulatory Visit (INDEPENDENT_AMBULATORY_CARE_PROVIDER_SITE_OTHER): Payer: Medicare Other | Admitting: *Deleted

## 2021-05-10 DIAGNOSIS — J441 Chronic obstructive pulmonary disease with (acute) exacerbation: Secondary | ICD-10-CM | POA: Diagnosis not present

## 2021-05-10 DIAGNOSIS — Z952 Presence of prosthetic heart valve: Secondary | ICD-10-CM

## 2021-05-10 DIAGNOSIS — I130 Hypertensive heart and chronic kidney disease with heart failure and stage 1 through stage 4 chronic kidney disease, or unspecified chronic kidney disease: Secondary | ICD-10-CM | POA: Diagnosis not present

## 2021-05-10 DIAGNOSIS — Z7901 Long term (current) use of anticoagulants: Secondary | ICD-10-CM | POA: Diagnosis not present

## 2021-05-10 DIAGNOSIS — N179 Acute kidney failure, unspecified: Secondary | ICD-10-CM | POA: Diagnosis not present

## 2021-05-10 DIAGNOSIS — N184 Chronic kidney disease, stage 4 (severe): Secondary | ICD-10-CM | POA: Diagnosis not present

## 2021-05-10 DIAGNOSIS — Z5181 Encounter for therapeutic drug level monitoring: Secondary | ICD-10-CM

## 2021-05-10 DIAGNOSIS — I5043 Acute on chronic combined systolic (congestive) and diastolic (congestive) heart failure: Secondary | ICD-10-CM | POA: Diagnosis not present

## 2021-05-10 DIAGNOSIS — E1122 Type 2 diabetes mellitus with diabetic chronic kidney disease: Secondary | ICD-10-CM | POA: Diagnosis not present

## 2021-05-10 LAB — POCT INR: INR: 2.3 (ref 2.0–3.0)

## 2021-05-10 NOTE — Telephone Encounter (Signed)
INR 2.3 PT 27.5  Please call Janett Billow [phone]

## 2021-05-10 NOTE — Telephone Encounter (Signed)
Spoke with [facility].  Coumadin orders given.  See coumadin note.

## 2021-05-10 NOTE — Patient Instructions (Signed)
Continue warfarin to 1 tablet  (10mg ) daily except for 1/2 tablet (5mg )  on Monday and Friday. Order given to [place] in 1 wk

## 2021-05-11 ENCOUNTER — Ambulatory Visit (INDEPENDENT_AMBULATORY_CARE_PROVIDER_SITE_OTHER): Payer: Medicare Other

## 2021-05-11 ENCOUNTER — Other Ambulatory Visit: Payer: Self-pay

## 2021-05-11 DIAGNOSIS — Z5329 Procedure and treatment not carried out because of patient's decision for other reasons: Secondary | ICD-10-CM

## 2021-05-11 IMAGING — DX DG CHEST 1V PORT
1 series · 1 of 1 positions shown · non-contrast
Comparison: Chest radiograph dated 06/30/2020.

CLINICAL DATA: 88-year-old female with seizure.

EXAM:
PORTABLE CHEST 1 VIEW

[chest ap]
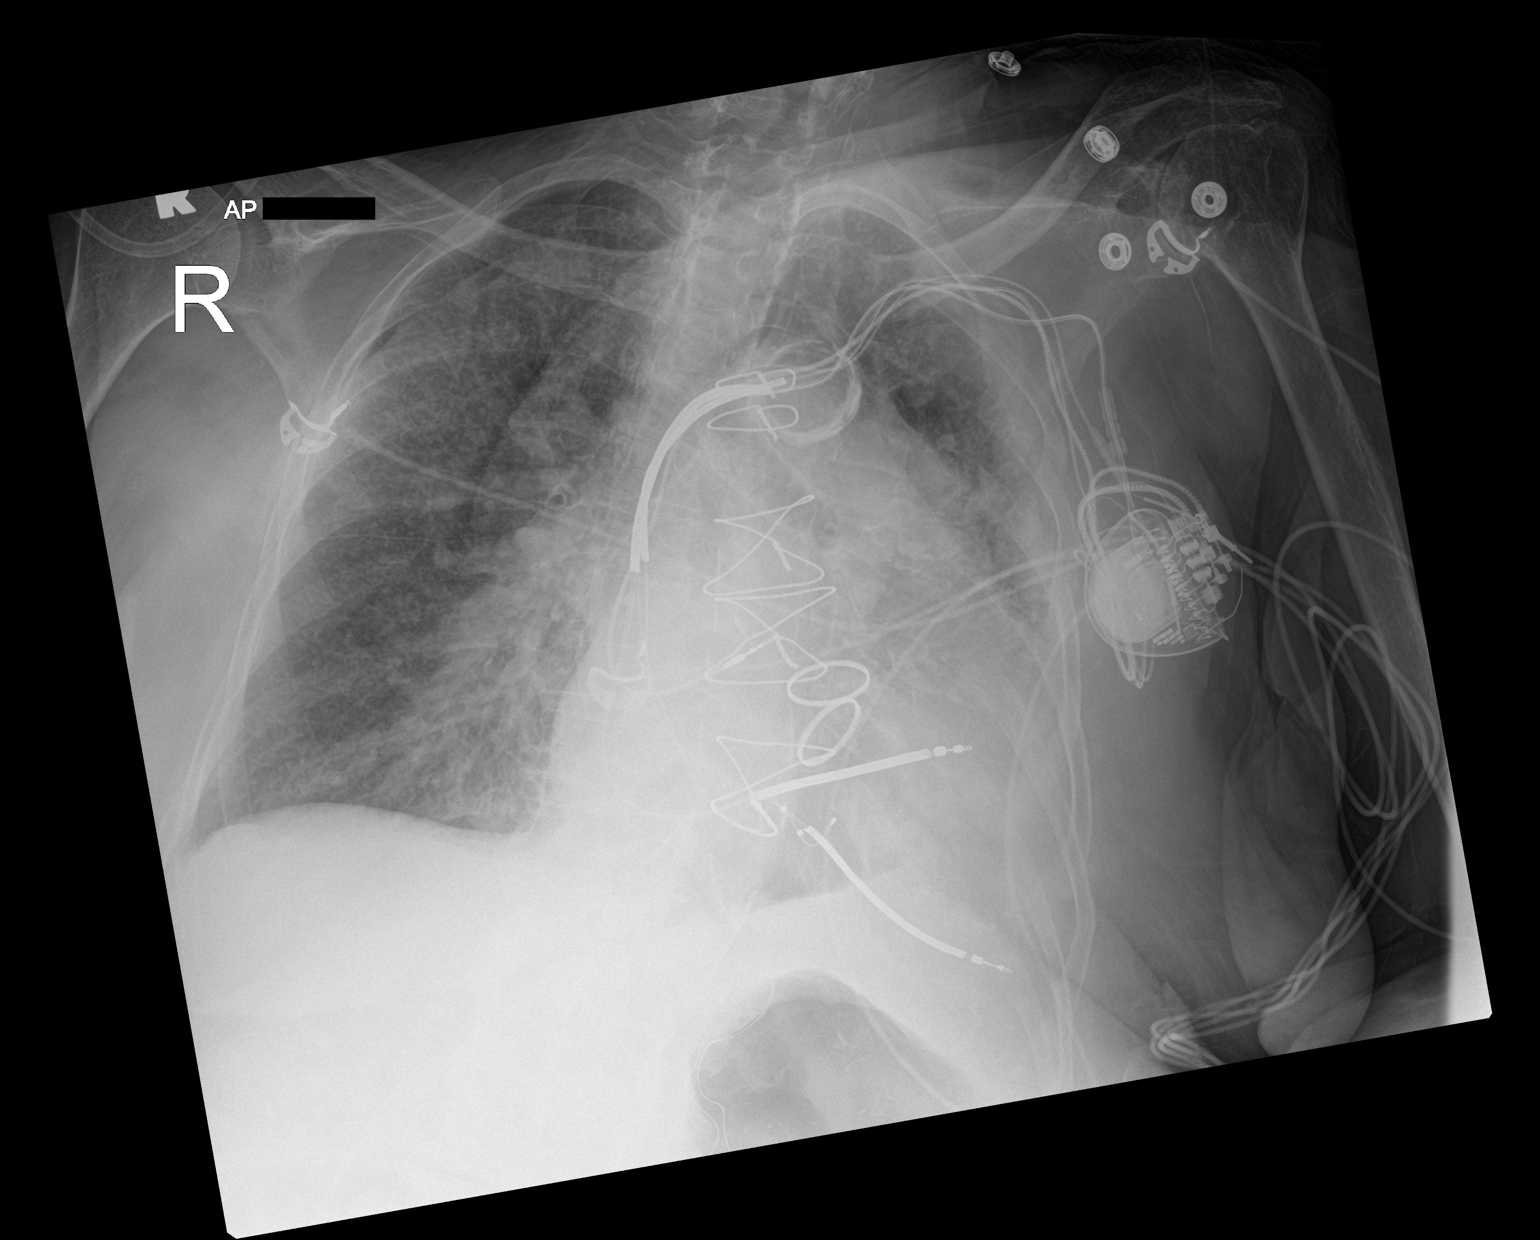

[1 of 1 positions shown; findings below may reference images not displayed]

FINDINGS: Evaluation is limited due to patient's rotation.

There is stable cardiomegaly with vascular congestion. Diffuse
interstitial and peribronchial densities may represent edema
although atypical infection is not excluded. Clinical correlation is
recommended. No large pleural effusion or pneumothorax. Median
sternotomy wires and left pectoral AICD device. Atherosclerotic
calcification of the aorta. No acute osseous pathology.
IMPRESSION: Stable cardiomegaly with vascular congestion. Pneumonia is not
excluded.

## 2021-05-11 NOTE — Progress Notes (Deleted)
Subjective:   [name] [name] is a 87 y.o. female who presents for Medicare Annual (Subsequent) preventive examination. I connected with  [name] on 05/11/21 by a audio enabled telemedicine application and verified that I am speaking with the correct person using two identifiers.   I discussed the limitations of evaluation and management by telemedicine. The patient expressed understanding and agreed to proceed.   Review of Systems    Defer to PCP       Objective:    There were no vitals filed for this visit. There is no height or weight on file to calculate BMI.  Advanced Directives 05/03/2021 04/12/2021 03/21/2021 02/17/2021 02/15/2021 02/07/2021 02/04/2021  Does Patient Have a Medical Advance Directive? No No No No No No No  Type of Advance Directive - - - - - - -  Does patient want to make changes to medical advance directive? - No - Patient declined No - Patient declined No - Patient declined No - Patient declined No - Patient declined No - Patient declined  Would patient like information on creating a medical advance directive? No - Patient declined No - Patient declined No - Patient declined - No - Patient declined - -  Pre-existing out of facility DNR order (yellow form or pink MOST form) Pink MOST form placed in chart (order not valid for inpatient use) - - - - - -    Current Medications (verified) Outpatient Encounter Medications as of 05/11/2021  Medication Sig   acetaminophen (TYLENOL) 500 MG tablet Take 500 mg by mouth every 6 (six) hours as needed.   albuterol (PROVENTIL) (2.5 MG/3ML) 0.083% nebulizer solution Take 3 mLs (2.5 mg total) by nebulization in the morning and at bedtime.   albuterol (VENTOLIN HFA) 108 (90 Base) MCG/ACT inhaler Inhale 2 puffs into the lungs every 4 (four) hours as needed for wheezing or shortness of breath.   allopurinol (ZYLOPRIM) 100 MG tablet Take 1 tablet (100 mg total) by mouth daily.   Amino Acids-Protein Hydrolys (FEEDING SUPPLEMENT,  PRO-STAT SUGAR FREE 64,) LIQD Take 30 mLs by mouth 2 (two) times daily between meals.   Balsam [place]-Castor Oil (VENELEX) OINT Apply topically. topical, Every Shift, Apply to bilateral buttocks and sacrum qshift.   benzonatate (TESSALON) 100 MG capsule Take 1 capsule (100 mg total) by mouth 2 (two) times daily as needed for cough.   cholecalciferol (VITAMIN D3) 25 MCG (1000 UNIT) tablet Take 1 tablet (1,000 Units total) by mouth in the morning.   fluticasone (FLONASE) 50 MCG/ACT nasal spray Place 2 sprays into both nostrils daily.   fluticasone-salmeterol (ADVAIR) 250-50 MCG/ACT AEPB Inhale 1 puff into the lungs in the morning and at bedtime.   folic acid (FOLVITE) 1 MG tablet TAKE ONE TABLET BY MOUTH ONCE DAILY.   furosemide (LASIX) 40 MG tablet TAKE (1) TABLET BY MOUTH DAILY.   gabapentin (NEURONTIN) 300 MG capsule TAKE (1) CAPSULE BY MOUTH TWICE DAILY   hydrALAZINE (APRESOLINE) 10 MG tablet TAKE (1) TABLET BY MOUTH EVERY EIGHT HOURS.   isosorbide mononitrate (IMDUR) 30 MG 24 hr tablet TAKE 1/2 TABLET BY MOUTH ONCE DAILY.   levETIRAcetam (KEPPRA) 500 MG tablet TAKE (1) TABLET BY MOUTH TWICE DAILY AT 9AM AND 9PM   metoprolol succinate (TOPROL-XL) 50 MG 24 hr tablet Take 1 tablet (50 mg total) by mouth daily. Take with or immediately following a meal.   nitroGLYCERIN (NITROSTAT) 0.4 MG SL tablet DISSOLVE 1 TABLET SUBLINGUALLY AS NEEDED FOR CHEST PAIN, MAY REPEAT EVERY 5  MINUTES. AFTER 3 CALL 911.   NON FORMULARY Diet :mechanical soft diet with ground meats, NAS, cons CHO   ondansetron (ZOFRAN-ODT) 4 MG disintegrating tablet TAKE (1) TABLET BY MOUTH EVERY EIGHT HOURS AS NEEDED FOR NAUSEA OR VOMITING   OXYGEN Inhale 3 L into the lungs continuous.   polyethylene glycol (MIRALAX / GLYCOLAX) 17 g packet Take 17 g by mouth daily as needed.   potassium chloride SA (KLOR-CON) 20 MEQ tablet Take 1 tablet (20 mEq total) by mouth daily. (Patient taking differently: Take 20 mEq by mouth daily. 04/21/21- takes  one QOD per pt- per nephrologist order)   pravastatin (PRAVACHOL) 40 MG tablet TAKE ONE TABLET BY MOUTH ONCE DAILY.   predniSONE (DELTASONE) 20 MG tablet Take 2 tablets (40 mg total) by mouth daily.   SYNTHROID 88 MCG tablet TAKE 1 TABLET BEFORE BREAKFAST.   UNABLE TO FIND Compression Stockings R-  Ankle  26.5cm   Calf 38cm  Leg  44cm L-  Ankle  27.5 cm   Calf 39cm  Leg  44cm (Patient taking differently: Compression Stockings R-  Ankle  26.5cm   Calf 38cm  Leg  44cm L-  Ankle  27.5 cm   Calf 39cm  Leg  44cm)   warfarin (COUMADIN) 10 MG tablet Take warfarin 15mg  tonight then continue 10mg  daily except 5mg  on Mondays, Wednesdays and Fridays or as directed   No facility-administered encounter medications on file as of 05/11/2021.    Allergies (verified) Penicillins, Aspirin, Fentanyl, and Niacin   History: Past Medical History:  Diagnosis Date   Acute on chronic diastolic CHF (congestive heart failure) ([place]) 05/19/2012   Acute on chronic respiratory failure with hypoxia ([place]) 01/31/2020   Adenomatous polyp of colon 12/16/2002   Dr. [name]/[facility]   Allergy    Calculus of gallbladder with chronic cholecystitis without obstruction 02/09/2009   Qualifier: Diagnosis of  By: [name], Margaret     Cataract    CHB (complete heart block) ([place]) 10/07/2014       CHF (congestive heart failure) ([place])    a.  EF previously reduced to 25-30% b. EF improved to 60-65% by echo in 10/2017.    Chronic gout due to renal impairment    Per [facility], [facility] Electronic Medical Records System    Chronic kidney disease, stage I 2006   Cognitive communication deficit    Per [facility], [facility] Records [facility]    Constipation        Convulsion ([place])    Per [facility], [facility] Records [facility]    COPD (chronic obstructive pulmonary disease) ([place])        DJD (degenerative joint disease) of lumbar spine     DM (diabetes mellitus) ([place]) 2006   Dysphagia, unspecified    Per [facility], [facility] Electronic Medical Records [facility]    Esophageal reflux    Glaucoma    Gout        H/O adenomatous polyp of colon 05/24/2012   2004 in [place] Last colonoscopy 2008 normal     H/O heart valve replacement with mechanical valve    a. s/p mechanical AVR in 2001 and mechanical MVR in 2004.   H/O wisdom tooth extraction    HIP PAIN, RIGHT 04/23/2009   Qualifier: Diagnosis of  By: [name], Margaret     Hyperkalemia    Per [facility]adie Cleverly, [facility] Electronic [facility] Records [facility]  Hyperlipemia    Hypertensive heart and kidney disease with HF and with CKD stage I-IV ([place])    Per [facility], [facility] Records [facility]    IDA (iron deficiency anemia)    Parenteral iron/[name]   Insomnia        Localized edema    Left arm, Per [facility], [facility] Records [facility]    Long term (current) use of anticoagulants    Per [facility], [facility] Electronic [facility] Records [facility]    Nausea without vomiting 06/07/2010   Qualifier: Diagnosis of  By: [name], Margaret     Non-ischemic cardiomyopathy [facility])    a. s/p MDT CRTD   Obesity, unspecified    Oxygen deficiency 2014   nocturnal;   Papilloma of breast 03/06/2012   This was diagnosed as a left breast papilloma by ultrasound characteristics and symptoms in 2010. Because of possible medical risk factors no surgical intervention has been done and it did doing annual followups. The area has been stable by physical examination and mammograms and ultrasound since 2010.    Presence of cardiac pacemaker    Per [facility], [facility] Electronic [facility] Records [facility]    Spinal stenosis of lumbar region 02/19/2013   Spondylolisthesis    Spondylosis    Type 2 diabetes mellitus with hyperglycemia ([place]) 07/15/2010   Type 2 diabetes mellitus with hypoglycemia without coma  ([place])    Per [facility], [facility] Electronic [facility] Records [facility]    Unspecified chronic bronchitis ([place])    Per [facility], [facility] Records [facility]    Unspecified hypothyroidism        Urinary tract infection, site not specified    Per [facility], [facility] Electronic [facility] Records [facility]    Varicose veins of lower extremities with other complications    Vertigo        Past Surgical History:  Procedure Laterality Date   AORTIC AND MITRAL VALVE REPLACEMENT  2001   BI-VENTRICULAR IMPLANTABLE CARDIOVERTER DEFIBRILLATOR UPGRADE N/A 01/18/2015   Procedure: BI-VENTRICULAR IMPLANTABLE CARDIOVERTER DEFIBRILLATOR UPGRADE;  Surgeon: Evans Lance, MD;  Location: [facility] CATH LAB;  Service: Cardiovascular;  Laterality: N/A;   BIV ICD GENERATOR CHANGEOUT N/A 01/30/2019   Procedure: BIV ICD GENERATOR CHANGEOUT;  Surgeon: Evans Lance, MD;  Location: [facility] CV LAB;  Service: Cardiovascular;  Laterality: N/A;   CARDIAC CATHETERIZATION     CARDIAC VALVE REPLACEMENT     CARDIOVERSION N/A 11/13/2016   Procedure: CARDIOVERSION;  Surgeon: Sanda Klein, MD;  Location: [facility] ENDOSCOPY;  Service: Cardiovascular;  Laterality: N/A;   COLONOSCOPY  06/12/2007   Rourk- Normal rectum/Normal colon   COLONOSCOPY  12/16/02   small hemorrhoids   COLONOSCOPY  06/20/2012   Procedure: COLONOSCOPY;  Surgeon: Daneil Dolin, MD;  Location: [facility];  Service: Endoscopy;  Laterality: N/A;  10:45   defibrillator implanted 2006     DOPPLER ECHOCARDIOGRAPHY  2012   DOPPLER ECHOCARDIOGRAPHY  05,06,07,08,09,2011   ESOPHAGOGASTRODUODENOSCOPY  06/12/2007   Rourk- normal esophagus, small hiatal hernia, otherwise normal stomach, D1, D2   EYE SURGERY Right 2005   EYE SURGERY Left 2006   ICD LEAD REMOVAL Left 02/08/2015   Procedure: ICD LEAD REMOVAL;  Surgeon: Evans Lance, MD;  Location: [place];  Service: Cardiovascular;  Laterality: Left;  "Will plan extraction and insertion  of a BiV PM"  **Dr. Roxan Hockey backing up case**   IMPLANTABLE CARDIOVERTER DEFIBRILLATOR (ICD) GENERATOR CHANGE  Left 02/08/2015   Procedure: ICD GENERATOR CHANGE;  Surgeon: Evans Lance, MD;  Location: [place];  Service: Cardiovascular;  Laterality: Left;   INSERT / REPLACE / REMOVE PACEMAKER     PACEMAKER INSERTION  May 2016   pacemaker placed  2004   right breast cyst removed     benign    right cataract removed     2005   TEE WITHOUT CARDIOVERSION N/A 11/13/2016   Procedure: TRANSESOPHAGEAL ECHOCARDIOGRAM (TEE);  Surgeon: Sanda Klein, MD;  Location: [facility] ENDOSCOPY;  Service: Cardiovascular;  Laterality: N/A;   THYROIDECTOMY     Family History  Problem Relation Age of Onset   Pancreatic cancer Mother    Heart disease Father    Heart disease Sister    Heart attack Sister    Heart disease Brother    Diabetes Brother    Heart disease Brother    Diabetes Brother    Hypertension Brother    Lung cancer Brother    Social History   Socioeconomic History   Marital status: Widowed    Spouse name: Not on file   Number of children: 0   Years of education: Not on file   Highest education level: Not on file  Occupational History   Occupation: retired    Fish farm manager: RETIRED  Tobacco Use   Smoking status: Former    Packs/day: 0.75    Years: 40.00    Pack years: 30.00    Types: Cigarettes    Quit date: 03/08/1987    Years since quitting: 34.2   Smokeless tobacco: Never  Vaping Use   Vaping Use: Never used  Substance and Sexual Activity   Alcohol use: No    Alcohol/week: 0.0 standard drinks   Drug use: No   Sexual activity: Not Currently  Other Topics Concern   Not on file  Social History Narrative   From [place] (2004)   Social Determinants of Health   Financial Resource Strain: Not on file  Food Insecurity: No Food Insecurity   Worried About Charity fundraiser in the Last Year: Never true   Arboriculturist in the Last Year: Never true  Transportation Needs: No  Transportation Needs   Lack of Transportation (Medical): No   Lack of Transportation (Non-Medical): No  Physical Activity: Not on file  Stress: Not on file  Social Connections: Not on file    Tobacco Counseling Counseling given: Not Answered   Clinical Intake:                 Diabetic?***         Activities of Daily Living In your present state of health, do you have any difficulty performing the following activities: 01/25/2021 01/18/2021  Hearing? N (No Data)  Comment - unknown  Vision? N (No Data)  Comment - unknown  Difficulty concentrating or making decisions? - Y  Comment - new today  Walking or climbing stairs? - Y  Dressing or bathing? - Y  Doing errands, shopping? - Y  Some recent data might be hidden    Patient Care Team: Fayrene Helper, MD as PCP - General (Family Medicine) Croitoru, Dani Gobble, MD as PCP - Cardiology (Cardiology) Evans Lance, MD as PCP - Electrophysiology (Cardiology) Evans Lance, MD as Consulting Physician (Cardiology) Sheldon Silvan, Manon Hilding, PA-C as Physician Assistant (Oncology) Madelin Headings, DO (Optometry) Fran Lowes, MD (Inactive) as Consulting Physician (Nephrology) Nickel, Sharmon Leyden, NP (Inactive) as Nurse Practitioner (Vascular Surgery) Sanjuana Kava, MD  as Consulting Physician (Orthopedic Surgery) Herminio Commons, MD (Inactive) as Attending Physician (Cardiology) Early, Arvilla Meres, MD as Consulting Physician (Vascular Surgery) Clista Bernhardt, MD as Consulting Physician [facility]) Sanda Klein, MD as Consulting Physician (Cardiology) System, Provider Not In Kassie Mends, RN as [place] any recent [place] you may have received from other than [facility] providers in the past year (date may be approximate).     Assessment:   This is a routine wellness examination for [place].  Hearing/Vision screen No results found.  Dietary  issues and exercise activities discussed:     Goals Addressed   None    Depression Screen PHQ 2/9 Scores 12/14/2020 11/11/2020 09/22/2020 08/24/2020 08/16/2020 05/10/2020 02/26/2020  PHQ - 2 Score 0 1 0 0 0 0 0  PHQ- 9 Score - - - - - - -    Fall Risk Fall Risk  04/04/2021 12/14/2020 11/11/2020 09/22/2020 08/24/2020  Falls in the past year? 1 0 0 1 0  Number falls in past yr: 0 0 0 0 0  Injury with Fall? 0 0 0 0 0  Risk for fall due to : - No Fall Risks No Fall Risks Impaired mobility No Fall Risks  Follow up - Falls evaluation completed Falls evaluation completed Falls evaluation completed Falls evaluation completed    [place]:  Any stairs in or around the home? {YES/NO:21197} If so, are there any without handrails? {YES/NO:21197} Home free of loose throw rugs in walkways, pet beds, electrical cords, etc? {YES/NO:21197} Adequate lighting in your home to reduce risk of falls? {YES/NO:21197}  ASSISTIVE DEVICES UTILIZED TO PREVENT FALLS:  Life alert? {YES/NO:21197} Use of a cane, walker or w/c? {YES/NO:21197} Grab bars in the bathroom? {YES/NO:21197} Shower chair or bench in shower? {YES/NO:21197} Elevated toilet seat or a handicapped toilet? {YES/NO:21197}  TIMED UP AND GO:  Was the test performed?  N/A .  Length of time to ambulate 10 feet: N/A sec.     Cognitive Function:     6CIT Screen 05/10/2020 02/05/2019 02/01/2018 03/12/2017  What Year? 0 points 0 points 0 points 0 points  What month? 0 points 0 points 0 points 0 points  What time? 0 points 0 points 0 points 0 points  Count back from 20 0 points 0 points 0 points 0 points  Months in reverse 0 points 0 points 2 points 0 points  Repeat phrase 0 points 0 points 2 points 0 points  Total Score 0 0 4 0    Immunizations Immunization History  Administered Date(s) Administered   Fluad Quad(high Dose 65+) 05/19/2019, 06/16/2020   Influenza Split 06/19/2011, 07/17/2012   Influenza Whole  06/20/2007, 06/22/2008, 06/24/2009, 05/27/2010   Influenza, High Dose Seasonal PF 07/17/2018   Influenza,inj,Quad PF,6+ Mos 07/15/2013, 06/03/2014, 06/21/2015, 07/06/2016, 11/17/2016, 06/29/2017   Moderna SARS-COV2 Booster Vaccination 12/09/2020, 03/01/2021   Moderna Sars-Covid-2 Vaccination 12/04/2019, 01/06/2020   Pneumococcal Conjugate-13 10/13/2014   Pneumococcal Polysaccharide-23 06/10/2008, 05/31/2019   Td 08/31/2004   Tdap 02/17/2015, 11/17/2019    TDAP status: Up to date  Flu Vaccine status: Due, Education has been provided regarding the importance of this vaccine. Advised may receive this vaccine at local pharmacy or Health Dept. Aware to provide a copy of the vaccination record if obtained from local pharmacy or Health Dept. Verbalized acceptance and understanding.  Pneumococcal vaccine status: Up to date  Covid-19 vaccine status: Completed vaccines  Qualifies for Shingles Vaccine? Yes  Zostavax completed  n/a   Shingrix Completed?: No.    Education has been provided regarding the importance of this vaccine. Patient has been advised to call insurance company to determine out of pocket expense if they have not yet received this vaccine. Advised may also receive vaccine at local pharmacy or Health Dept. Verbalized acceptance and understanding.  Screening Tests Health Maintenance  Topic Date Due   Zoster Vaccines- Shingrix (1 of 2) Never done   FOOT EXAM  07/18/2019   HEMOGLOBIN A1C  09/22/2020   OPHTHALMOLOGY EXAM  12/30/2020   INFLUENZA VACCINE  06/25/2021 (Originally 04/25/2021)   COVID-19 Vaccine (5 - Booster for Moderna series) 07/01/2021   TETANUS/TDAP  11/16/2029   DEXA SCAN  Completed   PNA vac Low Risk Adult  Completed   HPV VACCINES  Aged Out    Health Maintenance  Health Maintenance Due  Topic Date Due   Zoster Vaccines- Shingrix (1 of 2) Never done   FOOT EXAM  07/18/2019   HEMOGLOBIN A1C  09/22/2020   OPHTHALMOLOGY EXAM  12/30/2020    Colorectal  cancer screening: No longer required.   Mammogram status: No longer required due to age.  Bone Density status: Completed 02/10/2014. Results reflect: Bone density results: OSTEOPENIA. Repeat every 2 years.  Lung Cancer Screening: (Low Dose CT Chest recommended if Age 80-80 years, 30 pack-year currently smoking OR have quit w/in 15years.) does not qualify.   Lung Cancer Screening Referral: no  Additional Screening:  Hepatitis C Screening: does not qualify; Not Completed   Vision Screening: Recommended annual ophthalmology exams for early detection of glaucoma and other disorders of the eye. Is the patient up to date with their annual eye exam?  {YES/NO:21197} Who is the provider or what is the name of the office in which the patient attends annual eye exams? *** If pt is not established with a provider, would they like to be referred to a provider to establish care? {YES/NO:21197}.   Dental Screening: Recommended annual dental exams for proper oral hygiene  Community Resource Referral / Chronic Care Management: CRR required this visit?  {YES/NO:21197}  CCM required this visit?  {YES/NO:21197}     Plan:     I have personally reviewed and noted the following in the patient's chart:   Medical and social history Use of alcohol, tobacco or illicit drugs  Current medications and supplements including opioid prescriptions.  Functional ability and status Nutritional status Physical activity Advanced directives List of other physicians Hospitalizations, surgeries, and ER visits in previous 12 months Vitals Screenings to include cognitive, depression, and falls Referrals and appointments  In addition, I have reviewed and discussed with patient certain preventive protocols, quality metrics, and best practice recommendations. A written personalized care plan for preventive services as well as general preventive health recommendations were provided to patient.     Edgar Frisk,  [place]   05/11/2021   Nurse Notes: Non face to face  Ms. Gibbs , Thank you for taking time to come for your Medicare Wellness Visit. I appreciate your ongoing commitment to your health goals. Please review the following plan we discussed and let me know if I can assist you in the future.   These are the goals we discussed:  Goals      Track and Manage My Blood Pressure-Hypertension     Timeframe:  Long-Range Goal Priority:  Medium Start Date:      10/29/2020  Expected End Date:  04/28/2021                     Follow Up Date 05/19/2021   Continue checking blood pressure at least 3 times per week with the assistance of in home aide write blood pressure results in a log or diary Call primary care doctor for any readings outside of normal limits systolic >097, diastolic >35 Continue to follow a low salt diet- avoid fast food, salty chips, fries, etc. Continue to follow up with your doctor - Annual Wellness Visit 8/17 and primary care doctor 9/15/ Take all medications as prescribed Try to do some type of light exercise daily even if for a few minutes (walking is good)   Why is this important?   You won't feel high blood pressure, but it can still hurt your blood vessels.  High blood pressure can cause heart or kidney problems. It can also cause a stroke.  Making lifestyle changes like losing a little weight or eating less salt will help.  Checking your blood pressure at home and at different times of the day can help to control blood pressure.  If the doctor prescribes medicine remember to take it the way the doctor ordered.  Call the office if you cannot afford the medicine or if there are questions about it.         Track and Manage Symptoms-Heart Failure     Timeframe:  Long-Range Goal Priority:  High Start Date:       10/29/2020                      Expected End Date:   04/28/2021                    Follow Up Date 05/19/2021   Know how you are feeling each day and  continue to follow heart failure action plan if symptoms flare up Continue weighing and recording, ask CNA to help you know when to call the doctor- call for weight gain of 3 pounds in one day or 5 pounds in one week. Call RN Care Manager for any questions at [phone] It is important to wear compression hose as directed every day , take them off at night Take medications as prescribed and call for refills before running out You have follow up appointment with Dr. [name] on 9/15, Annual Wellness Visit on 8/17, bloodwork on 8/9 and Feraheme infusion on 8/3,  call doctor if you need a sooner appointment Use [place] Jelly if oxygen tubing is making your nose sore, do not use petroleum based products Alternate activity with rest and take your time   Why is this important?   You will be able to handle your symptoms better if you keep track of them.  Making some simple changes to your lifestyle will help.  Eating healthy is one thing you can do to take good care of yourself.            This is a list of the screening recommended for you and due dates:  Health Maintenance  Topic Date Due   Zoster (Shingles) Vaccine (1 of 2) Never done   Complete foot exam   07/18/2019   Hemoglobin A1C  09/22/2020   Eye exam for diabetics  12/30/2020   Flu Shot  06/25/2021*   COVID-19 Vaccine (5 - Booster for Moderna series) 07/01/2021   Tetanus Vaccine  11/16/2029   DEXA scan (bone  density measurement)  Completed   Pneumonia vaccines  Completed   HPV Vaccine  Aged Out  *Topic was postponed. The date shown is not the original due date.

## 2021-05-12 ENCOUNTER — Telehealth: Payer: Self-pay

## 2021-05-12 DIAGNOSIS — J9612 Chronic respiratory failure with hypercapnia: Secondary | ICD-10-CM | POA: Diagnosis not present

## 2021-05-12 DIAGNOSIS — G40909 Epilepsy, unspecified, not intractable, without status epilepticus: Secondary | ICD-10-CM | POA: Diagnosis not present

## 2021-05-12 DIAGNOSIS — I429 Cardiomyopathy, unspecified: Secondary | ICD-10-CM | POA: Diagnosis not present

## 2021-05-12 DIAGNOSIS — E785 Hyperlipidemia, unspecified: Secondary | ICD-10-CM | POA: Diagnosis not present

## 2021-05-12 DIAGNOSIS — E890 Postprocedural hypothyroidism: Secondary | ICD-10-CM | POA: Diagnosis not present

## 2021-05-12 DIAGNOSIS — Z87891 Personal history of nicotine dependence: Secondary | ICD-10-CM | POA: Diagnosis not present

## 2021-05-12 DIAGNOSIS — E559 Vitamin D deficiency, unspecified: Secondary | ICD-10-CM | POA: Diagnosis not present

## 2021-05-12 DIAGNOSIS — M1030 Gout due to renal impairment, unspecified site: Secondary | ICD-10-CM | POA: Diagnosis not present

## 2021-05-12 DIAGNOSIS — M48061 Spinal stenosis, lumbar region without neurogenic claudication: Secondary | ICD-10-CM | POA: Diagnosis not present

## 2021-05-12 DIAGNOSIS — M170 Bilateral primary osteoarthritis of knee: Secondary | ICD-10-CM

## 2021-05-12 DIAGNOSIS — G9341 Metabolic encephalopathy: Secondary | ICD-10-CM | POA: Diagnosis not present

## 2021-05-12 DIAGNOSIS — J449 Chronic obstructive pulmonary disease, unspecified: Secondary | ICD-10-CM | POA: Diagnosis not present

## 2021-05-12 DIAGNOSIS — E1142 Type 2 diabetes mellitus with diabetic polyneuropathy: Secondary | ICD-10-CM | POA: Diagnosis not present

## 2021-05-12 DIAGNOSIS — N184 Chronic kidney disease, stage 4 (severe): Secondary | ICD-10-CM | POA: Diagnosis not present

## 2021-05-12 DIAGNOSIS — E1122 Type 2 diabetes mellitus with diabetic chronic kidney disease: Secondary | ICD-10-CM | POA: Diagnosis not present

## 2021-05-12 DIAGNOSIS — I5043 Acute on chronic combined systolic (congestive) and diastolic (congestive) heart failure: Secondary | ICD-10-CM | POA: Diagnosis not present

## 2021-05-12 DIAGNOSIS — M25512 Pain in left shoulder: Secondary | ICD-10-CM

## 2021-05-12 DIAGNOSIS — I130 Hypertensive heart and chronic kidney disease with heart failure and stage 1 through stage 4 chronic kidney disease, or unspecified chronic kidney disease: Secondary | ICD-10-CM | POA: Diagnosis not present

## 2021-05-12 DIAGNOSIS — J9611 Chronic respiratory failure with hypoxia: Secondary | ICD-10-CM | POA: Diagnosis not present

## 2021-05-12 DIAGNOSIS — Z7901 Long term (current) use of anticoagulants: Secondary | ICD-10-CM | POA: Diagnosis not present

## 2021-05-12 DIAGNOSIS — Z9981 Dependence on supplemental oxygen: Secondary | ICD-10-CM | POA: Diagnosis not present

## 2021-05-12 NOTE — Telephone Encounter (Signed)
Patient requesting to speak with you about a referral that was supposed to be entered for advanced home care ph# [phone]

## 2021-05-12 NOTE — Addendum Note (Signed)
Addended by: Eual Fines on: 05/12/2021 02:52 PM   Modules accepted: Orders

## 2021-05-12 NOTE — Telephone Encounter (Signed)
Already getting nursing, wants to have PT also

## 2021-05-13 ENCOUNTER — Ambulatory Visit (INDEPENDENT_AMBULATORY_CARE_PROVIDER_SITE_OTHER): Payer: Medicare Other

## 2021-05-13 ENCOUNTER — Other Ambulatory Visit: Payer: Medicare Other

## 2021-05-13 ENCOUNTER — Other Ambulatory Visit: Payer: Self-pay

## 2021-05-13 DIAGNOSIS — Z515 Encounter for palliative care: Secondary | ICD-10-CM

## 2021-05-13 DIAGNOSIS — I442 Atrioventricular block, complete: Secondary | ICD-10-CM

## 2021-05-13 DIAGNOSIS — I5042 Chronic combined systolic (congestive) and diastolic (congestive) heart failure: Secondary | ICD-10-CM | POA: Diagnosis not present

## 2021-05-13 LAB — CUP PACEART REMOTE DEVICE CHECK
Battery Remaining Longevity: 51 mo
Battery Voltage: 2.95 V
Brady Statistic AP VP Percent: 0 %
Brady Statistic AP VS Percent: 0 %
Brady Statistic AS VP Percent: 99.85 %
Brady Statistic AS VS Percent: 0.15 %
Brady Statistic RA Percent Paced: 0 %
Brady Statistic RV Percent Paced: 99.84 %
Date Time Interrogation Session: 20220818225015
Implantable Lead Implant Date: 20040521
Implantable Lead Implant Date: 20071218
Implantable Lead Implant Date: 20160516
Implantable Lead Location: 753858
Implantable Lead Location: 753859
Implantable Lead Location: 753860
Implantable Lead Model: 5076
Implantable Lead Model: 6947
Implantable Pulse Generator Implant Date: 20200506
Lead Channel Impedance Value: 285 Ohm
Lead Channel Impedance Value: 285 Ohm
Lead Channel Impedance Value: 304 Ohm
Lead Channel Impedance Value: 361 Ohm
Lead Channel Impedance Value: 380 Ohm
Lead Channel Impedance Value: 399 Ohm
Lead Channel Impedance Value: 418 Ohm
Lead Channel Impedance Value: 437 Ohm
Lead Channel Impedance Value: 646 Ohm
Lead Channel Impedance Value: 722 Ohm
Lead Channel Impedance Value: 722 Ohm
Lead Channel Impedance Value: 722 Ohm
Lead Channel Impedance Value: 741 Ohm
Lead Channel Impedance Value: 798 Ohm
Lead Channel Pacing Threshold Amplitude: 1.375 V
Lead Channel Pacing Threshold Amplitude: 3.5 V
Lead Channel Pacing Threshold Pulse Width: 0.4 ms
Lead Channel Pacing Threshold Pulse Width: 1 ms
Lead Channel Sensing Intrinsic Amplitude: 9 mV
Lead Channel Sensing Intrinsic Amplitude: 9 mV
Lead Channel Setting Pacing Amplitude: 2.5 V
Lead Channel Setting Pacing Amplitude: 3 V
Lead Channel Setting Pacing Pulse Width: 0.8 ms
Lead Channel Setting Pacing Pulse Width: 1 ms
Lead Channel Setting Sensing Sensitivity: 4 mV

## 2021-05-16 IMAGING — DX DG CHEST 1V PORT SAME DAY
1 series · 1 of 1 positions shown · non-contrast
Comparison: Portable chest 0493 hours today and earlier.

CLINICAL DATA: 88-year-old female recently hospitalized. Increased
shortness of breath today. Negative for [id number].

EXAM:
PORTABLE CHEST 1 VIEW

[chest ap]
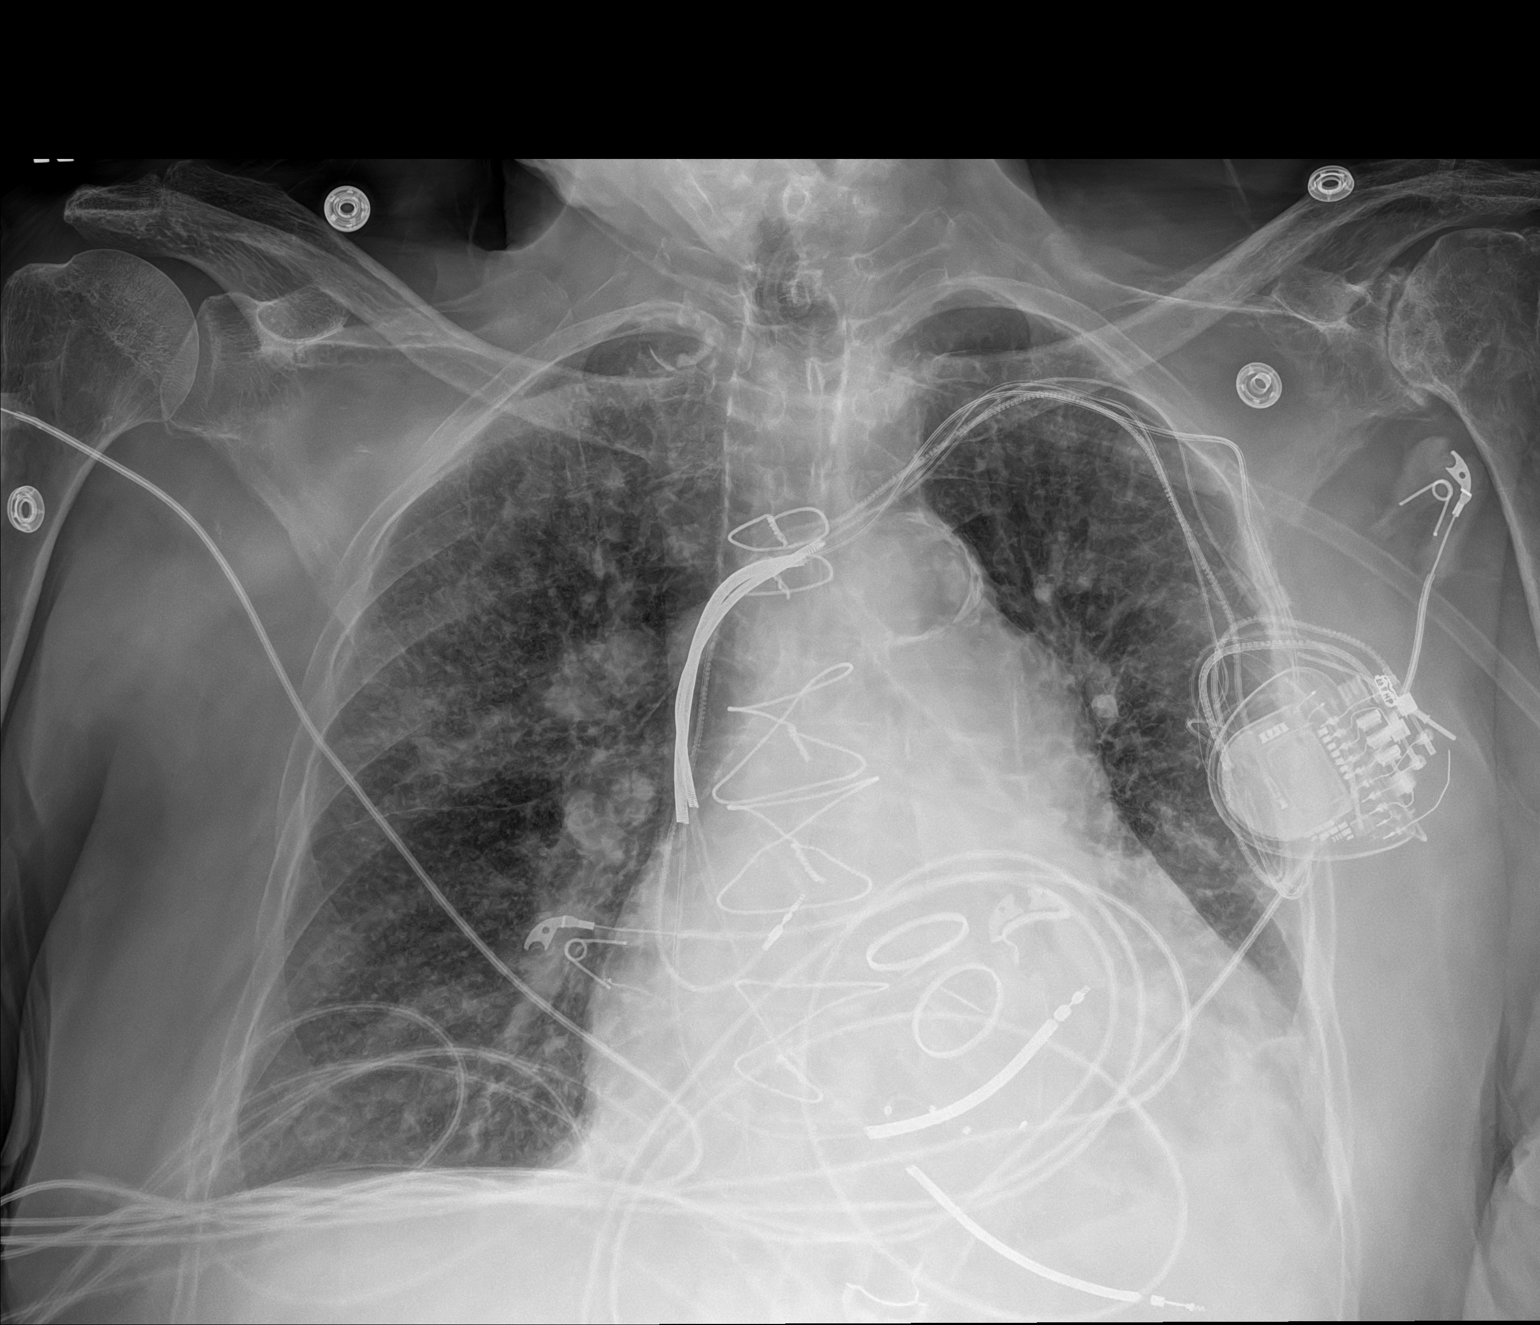

[1 of 1 positions shown; findings below may reference images not displayed]

FINDINGS: Portable AP upright view at 5588 hours. Stable cardiomegaly,
postoperative changes to the heart and mediastinum, left chest AICD.
Stable hypo ventilation at the left lung base. Underlying large lung
volumes. No pneumothorax. Continued asymmetric pulmonary
interstitial opacity which has not significantly changed over this
series of exams since [REDACTED]. Furthermore, similar interstitial
opacity has been present since a 6127 chest CT. Chronic interstitial
lung disease favored. No new pulmonary opacity. No pneumothorax.
Stable visualized osseous structures.
IMPRESSION: 1. Cardiomegaly with pulmonary hyperinflation and chronic
nonspecific interstitial opacity.
2. No new cardiopulmonary abnormality.

## 2021-05-16 IMAGING — DX DG CHEST 1V PORT
1 series · 1 of 1 positions shown · non-contrast
Comparison: Single-view of the chest 07/04/2020 07/03/2020.

CLINICAL DATA: Shortness of breath.

EXAM:
PORTABLE CHEST 1 VIEW

[chest ap]
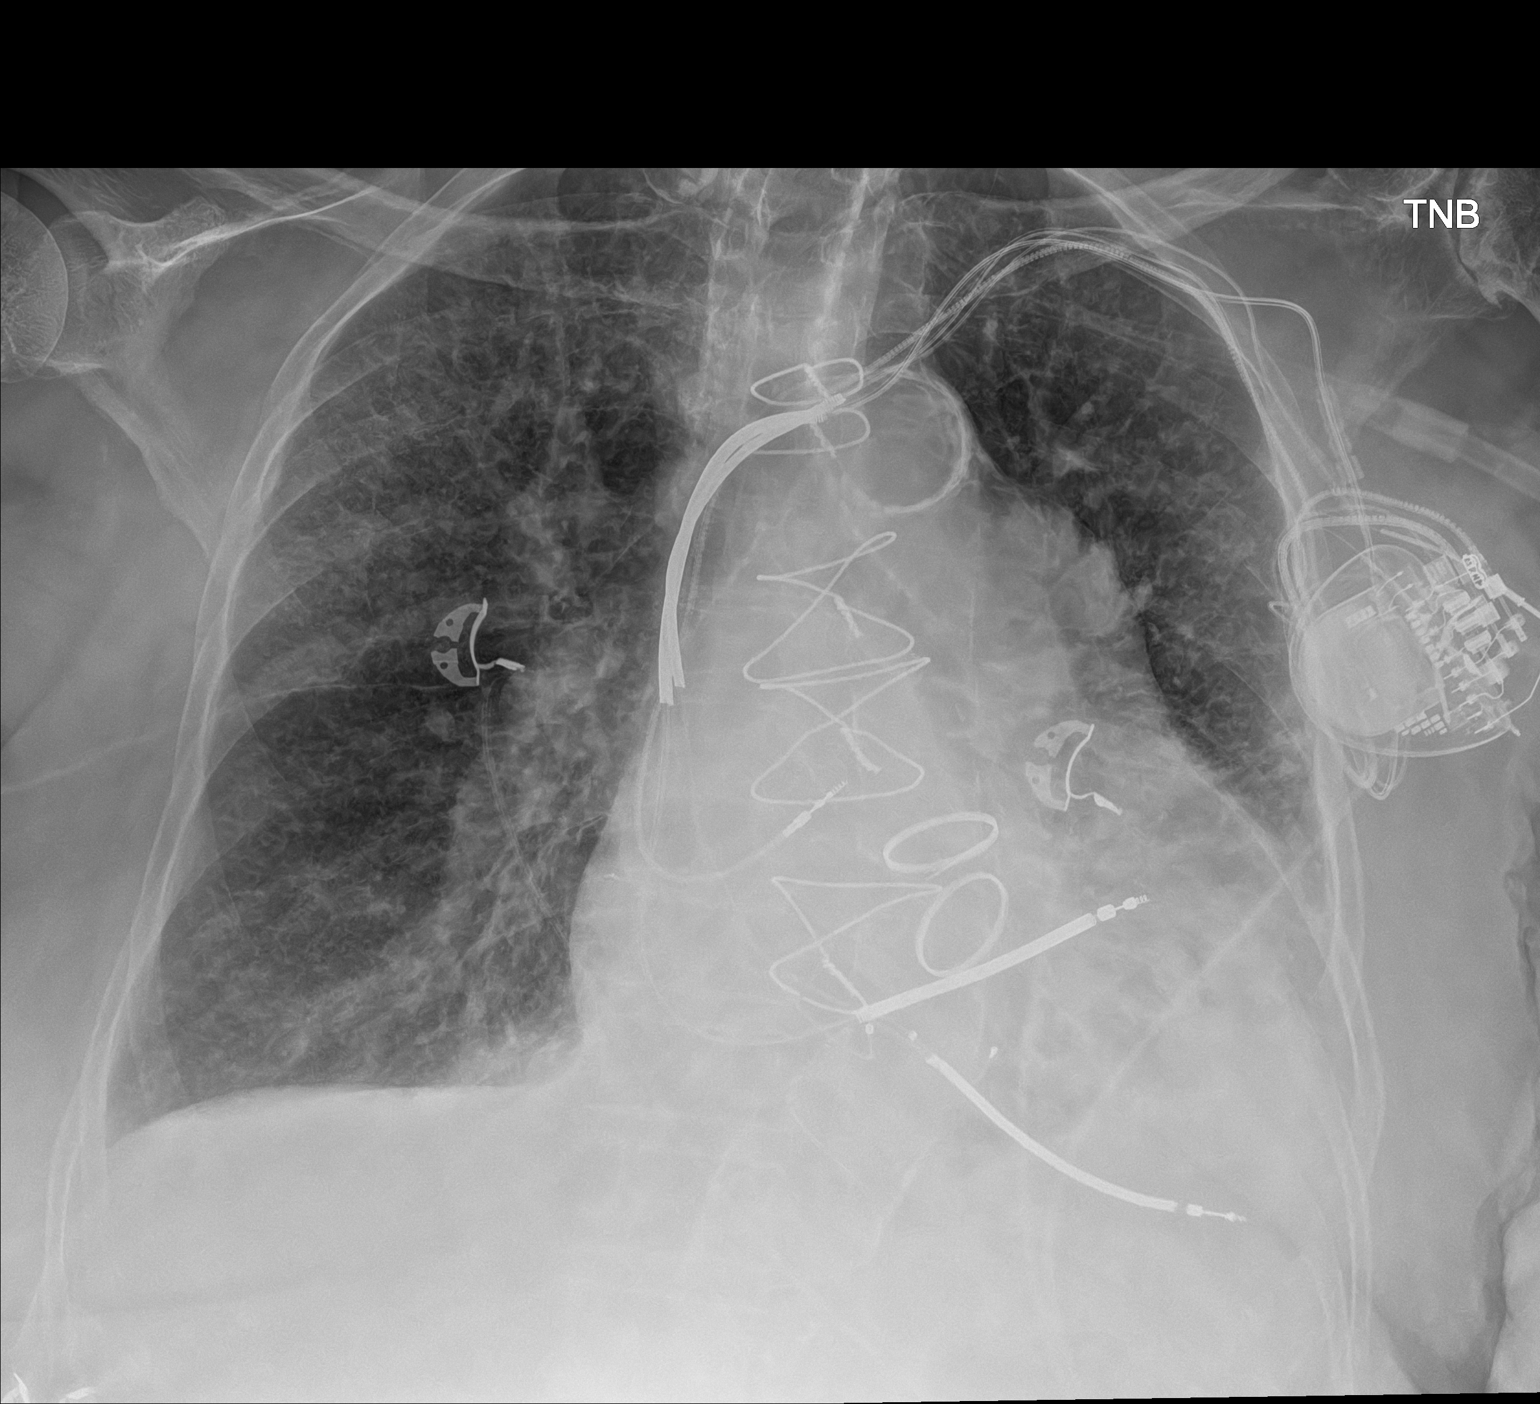

[1 of 1 positions shown; findings below may reference images not displayed]

FINDINGS: The lungs are emphysematous. There is cardiomegaly. Left basilar
airspace disease is unchanged. The patient is status post mitral and
tricuspid valve replacement. AICD is in place. Aortic
atherosclerosis. No acute or focal bony abnormality.
IMPRESSION: Left basilar airspace disease could be due to atelectasis or
pneumonia, unchanged.

Cardiomegaly without edema.

Aortic Atherosclerosis (IILZX-EYF.F) and Emphysema (IILZX-ICM.L).

## 2021-05-16 IMAGING — DX DG ABD PORTABLE 1V
2 series · 2 of 2 positions shown · non-contrast
Comparison: Chest plain films of the abdomen 01/26/2020.

CLINICAL DATA: Abdominal pain.

EXAM:
PORTABLE ABDOMEN - 1 VIEW

[abdomen supine (1 of 2)]
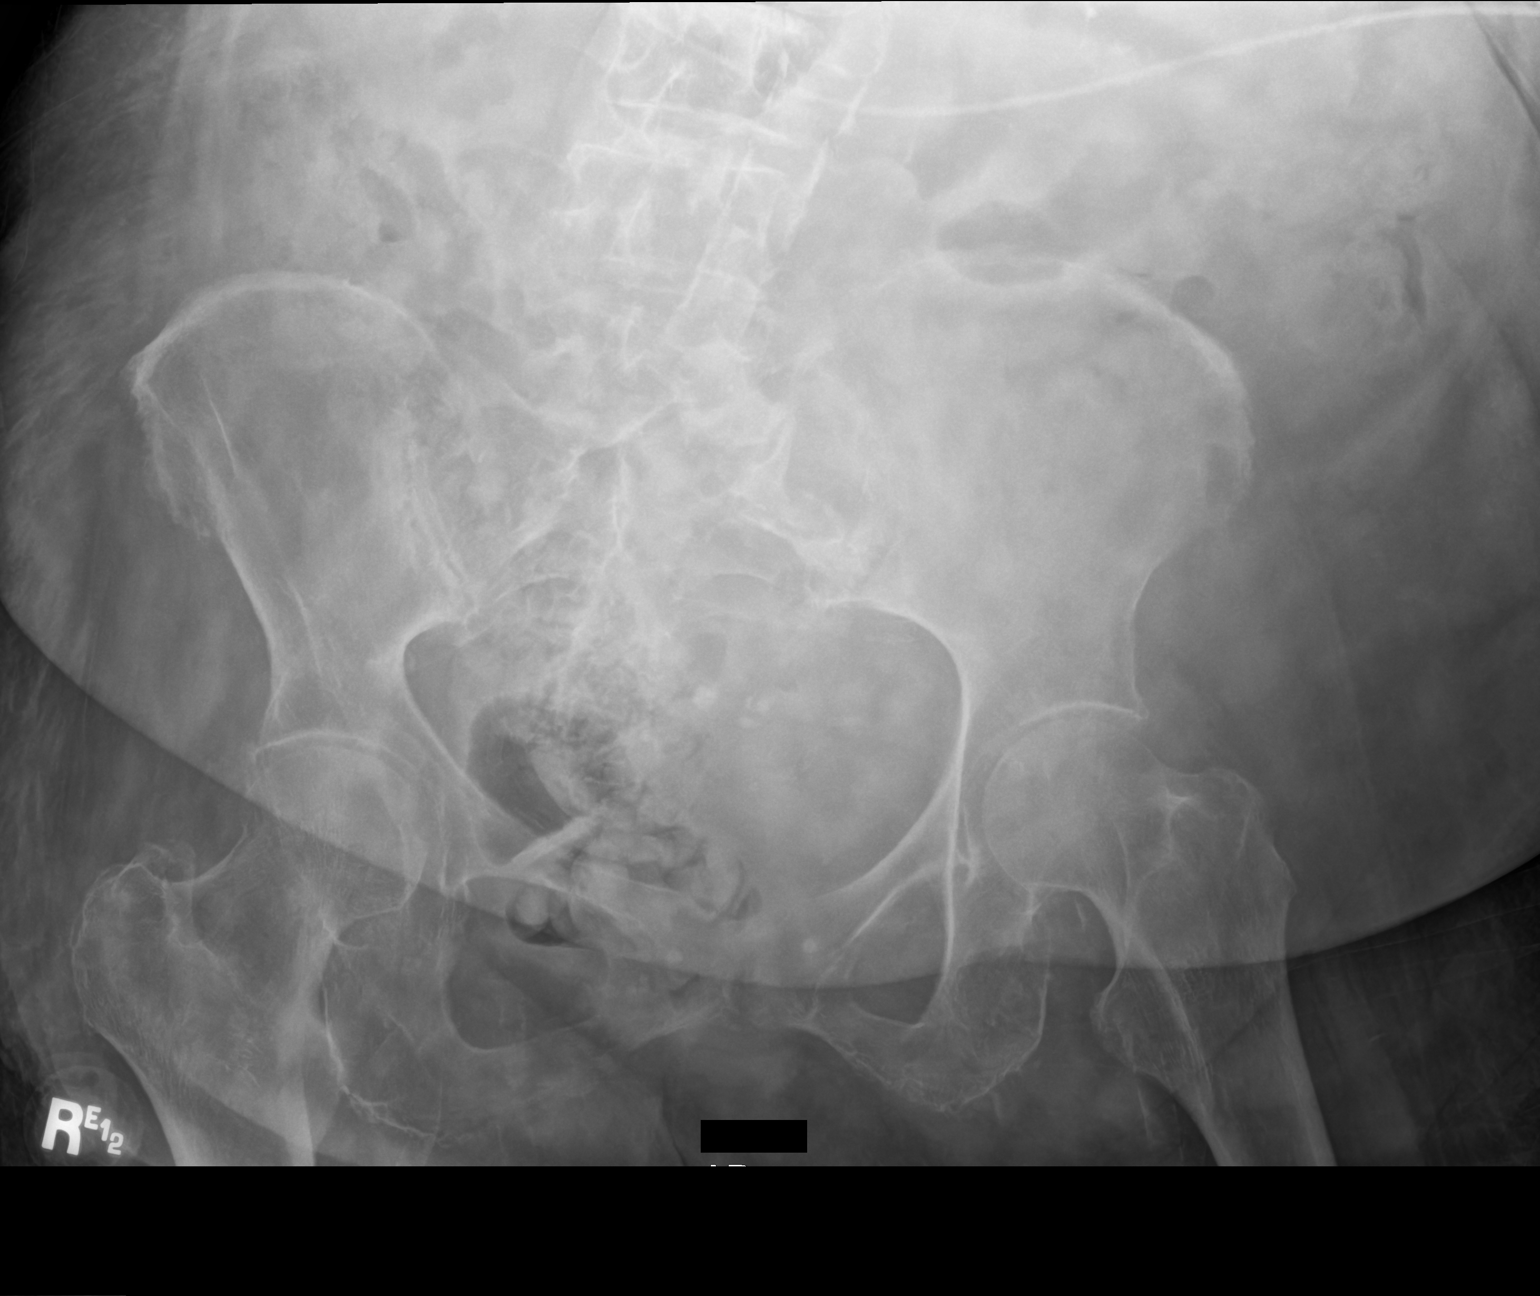

[abdomen supine (2 of 2)]
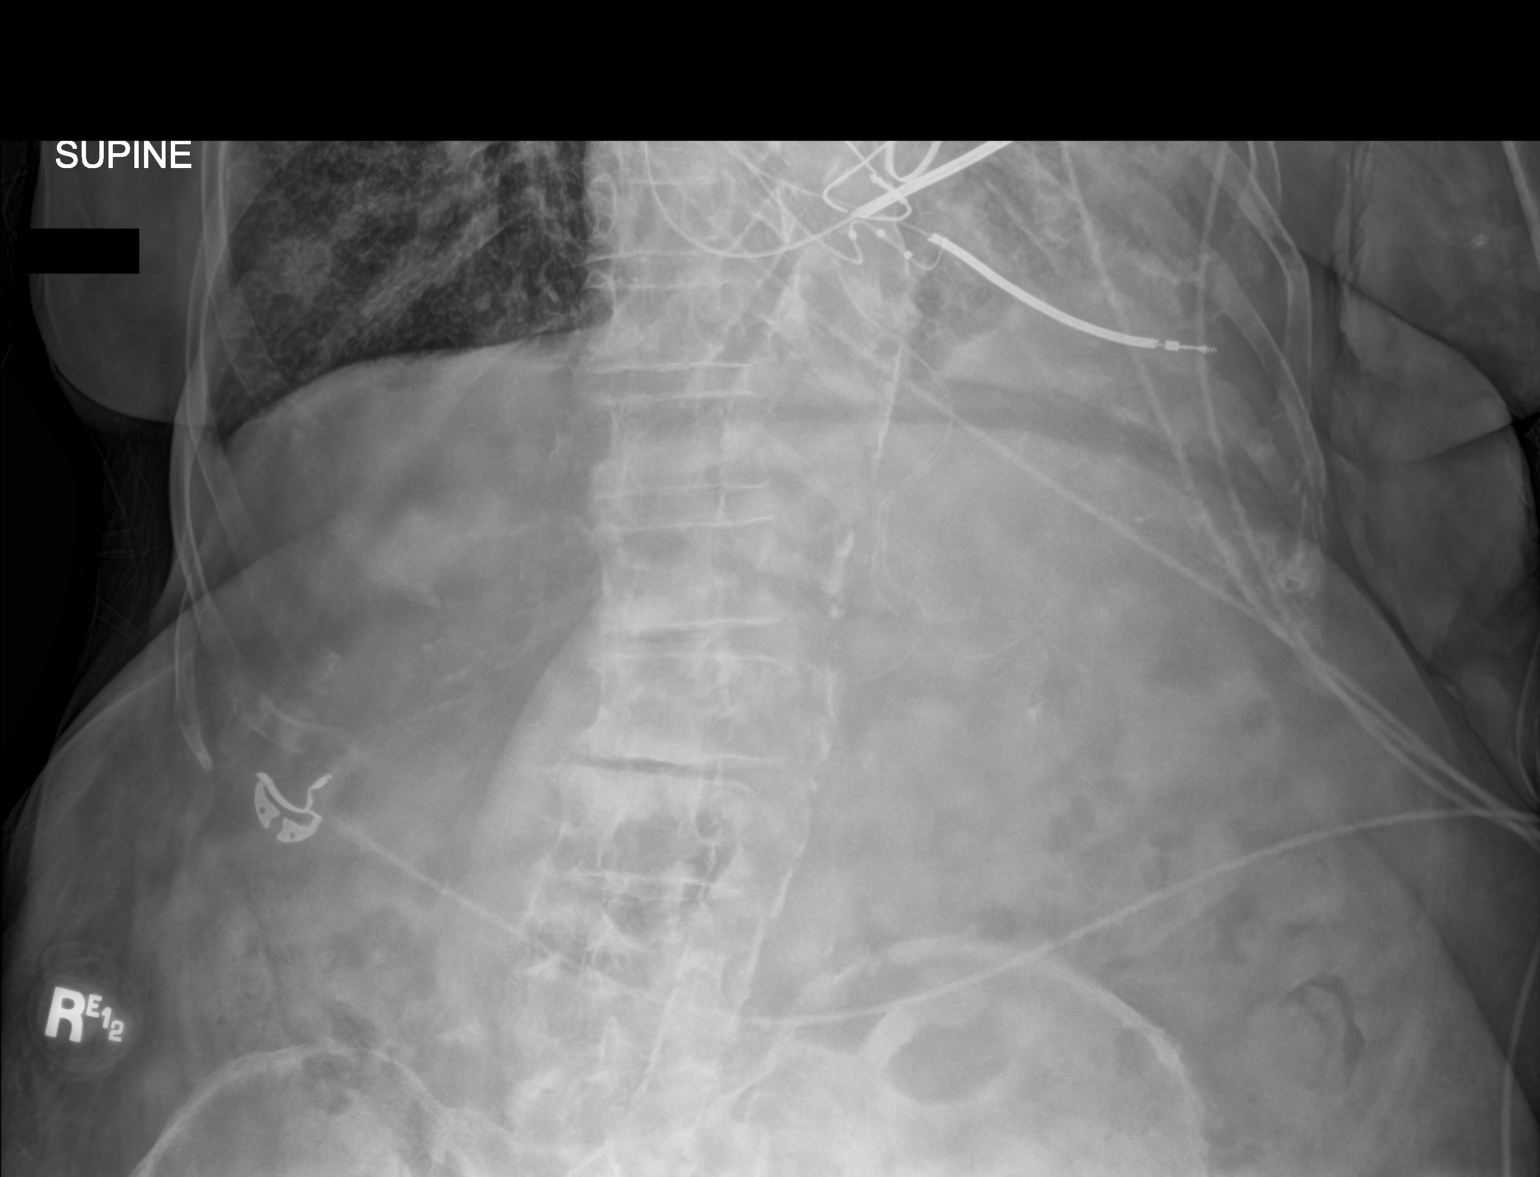

[2 of 2 positions shown; findings below may reference images not displayed]

FINDINGS: The bowel gas pattern is normal. No radio-opaque calculi or other
significant radiographic abnormality are seen. Extensive
atherosclerosis is noted.
IMPRESSION: No acute abnormality.

Aortic Atherosclerosis (FQGKT-MS5.5).

## 2021-05-17 ENCOUNTER — Telehealth: Payer: Self-pay | Admitting: *Deleted

## 2021-05-17 ENCOUNTER — Ambulatory Visit (INDEPENDENT_AMBULATORY_CARE_PROVIDER_SITE_OTHER): Payer: Medicare Other | Admitting: *Deleted

## 2021-05-17 DIAGNOSIS — N184 Chronic kidney disease, stage 4 (severe): Secondary | ICD-10-CM | POA: Diagnosis not present

## 2021-05-17 DIAGNOSIS — E1122 Type 2 diabetes mellitus with diabetic chronic kidney disease: Secondary | ICD-10-CM | POA: Diagnosis not present

## 2021-05-17 DIAGNOSIS — Z5181 Encounter for therapeutic drug level monitoring: Secondary | ICD-10-CM

## 2021-05-17 DIAGNOSIS — I4819 Other persistent atrial fibrillation: Secondary | ICD-10-CM | POA: Diagnosis not present

## 2021-05-17 DIAGNOSIS — Z952 Presence of prosthetic heart valve: Secondary | ICD-10-CM | POA: Diagnosis not present

## 2021-05-17 DIAGNOSIS — I130 Hypertensive heart and chronic kidney disease with heart failure and stage 1 through stage 4 chronic kidney disease, or unspecified chronic kidney disease: Secondary | ICD-10-CM | POA: Diagnosis not present

## 2021-05-17 DIAGNOSIS — M1030 Gout due to renal impairment, unspecified site: Secondary | ICD-10-CM | POA: Diagnosis not present

## 2021-05-17 DIAGNOSIS — J449 Chronic obstructive pulmonary disease, unspecified: Secondary | ICD-10-CM | POA: Diagnosis not present

## 2021-05-17 DIAGNOSIS — I5043 Acute on chronic combined systolic (congestive) and diastolic (congestive) heart failure: Secondary | ICD-10-CM | POA: Diagnosis not present

## 2021-05-17 LAB — POCT INR: INR: 3.2 — AB (ref 2.0–3.0)

## 2021-05-17 NOTE — Telephone Encounter (Signed)
Spoke with [facility].  Coumadin orders given.  See coumadin note.

## 2021-05-17 NOTE — Progress Notes (Signed)
PATIENT NAME: [name] [name] DOB: 07/06/1932 MRN: [id number]  PRIMARY CARE PROVIDER: Fayrene Helper, MD  RESPONSIBLE PARTY:  Acct ID - Guarantor Home Phone Work Phone Relationship Acct Type  [id number] - [place],[phone]  Self P/F     [address], [place], [place] [address]   COMMUNITY PALLIATIVE CARE RN NOTE    PLAN OF CARE and INTERVENTION:  ADVANCE CARE PLANNING/GOALS OF CARE: to be comfortable and supported as disease progresses. DNR/MOST in place PATIENT/CAREGIVER EDUCATION: Education provided regarding Hospice services, maintaining patient safety. DISEASE STATUS:  RN Telephonic Palliative care encounter completed. Spoke with patient. Alert and oriented. Patient reports she continues to have some pain but has hydrocodone that helps when she takes it. Patient noted that she is hesitant to take this and usually will only take Extra Strength Tylenol. Home health involved providing nursing care and patient shared she reached out to PCP to ask if PT could be added.    HISTORY OF PRESENT ILLNESS: This is an 89-year female who resides in her home with a caregiver. Patient has diagnosis including but not limited to diastolic heart failure, COPD, CKD. Complete Heart Block with pacemaker, DJD and DM.  Patient has supplemental oxygen. Patient reports a good appetite. No new concerns currently. Encouraged patient to call with any changes or concerns. CODE STATUS: Remains Full Code MOST FORM: NO PPS: 50%    PHYSICAL EXAM: Deferred        Maxwell Caul RN, BSN     Cornelius Moras, RN

## 2021-05-17 NOTE — Patient Instructions (Signed)
Hold warfarin tonight then resume 1 tablet  (10mg ) daily except for 1/2 tablet (5mg )  on Monday and Friday. Order given to [place] in 1 wk

## 2021-05-17 NOTE — Telephone Encounter (Signed)
[name] [name] @ [facility] [phone]  Called to report PT INR = 3.2

## 2021-05-18 DIAGNOSIS — E1122 Type 2 diabetes mellitus with diabetic chronic kidney disease: Secondary | ICD-10-CM | POA: Diagnosis not present

## 2021-05-18 DIAGNOSIS — N184 Chronic kidney disease, stage 4 (severe): Secondary | ICD-10-CM | POA: Diagnosis not present

## 2021-05-18 DIAGNOSIS — I130 Hypertensive heart and chronic kidney disease with heart failure and stage 1 through stage 4 chronic kidney disease, or unspecified chronic kidney disease: Secondary | ICD-10-CM | POA: Diagnosis not present

## 2021-05-18 DIAGNOSIS — J449 Chronic obstructive pulmonary disease, unspecified: Secondary | ICD-10-CM | POA: Diagnosis not present

## 2021-05-18 DIAGNOSIS — M1030 Gout due to renal impairment, unspecified site: Secondary | ICD-10-CM | POA: Diagnosis not present

## 2021-05-18 DIAGNOSIS — I5043 Acute on chronic combined systolic (congestive) and diastolic (congestive) heart failure: Secondary | ICD-10-CM | POA: Diagnosis not present

## 2021-05-19 ENCOUNTER — Ambulatory Visit (INDEPENDENT_AMBULATORY_CARE_PROVIDER_SITE_OTHER): Payer: Medicare Other | Admitting: *Deleted

## 2021-05-19 DIAGNOSIS — I10 Essential (primary) hypertension: Secondary | ICD-10-CM | POA: Diagnosis not present

## 2021-05-19 DIAGNOSIS — I5022 Chronic systolic (congestive) heart failure: Secondary | ICD-10-CM | POA: Diagnosis not present

## 2021-05-19 NOTE — Chronic Care Management (AMB) (Signed)
Chronic Care Management   CCM RN Visit Note  05/19/2021 Name: [name] [name] MRN: [id number] DOB: 10-Apr-1932  Subjective: [name] [name] is a 87 y.o. year old female who is a primary care patient of Fayrene Helper, MD. The care management team was consulted for assistance with disease management and care coordination needs.    Engaged with patient by telephone for follow up visit in response to provider referral for case management and/or care coordination services.   Consent to Services:  The patient was given information about Chronic Care Management services, agreed to services, and gave verbal consent prior to initiation of services.  Please see initial visit note for detailed documentation.   Patient agreed to services and verbal consent obtained.   Assessment: Review of patient past medical history, allergies, medications, health status, including review of consultants reports, laboratory and other test data, was performed as part of comprehensive evaluation and provision of chronic care management services.   SDOH (Social Determinants of Health) assessments and interventions performed:    CCM Care Plan  Allergies  Allergen Reactions   Penicillins Other (See Comments)    Bruise     Aspirin Other (See Comments)    On coumadin   Fentanyl Dermatitis    Rash with patch    Niacin Itching    Outpatient Encounter Medications as of 05/19/2021  Medication Sig   acetaminophen (TYLENOL) 500 MG tablet Take 500 mg by mouth every 6 (six) hours as needed.   albuterol (PROVENTIL) (2.5 MG/3ML) 0.083% nebulizer solution Take 3 mLs (2.5 mg total) by nebulization in the morning and at bedtime.   albuterol (VENTOLIN HFA) 108 (90 Base) MCG/ACT inhaler Inhale 2 puffs into the lungs every 4 (four) hours as needed for wheezing or shortness of breath.   allopurinol (ZYLOPRIM) 100 MG tablet Take 1 tablet (100 mg total) by mouth daily.   Amino Acids-Protein Hydrolys (FEEDING SUPPLEMENT,  PRO-STAT SUGAR FREE 64,) LIQD Take 30 mLs by mouth 2 (two) times daily between meals.   Balsam [place]-Castor Oil (VENELEX) OINT Apply topically. topical, Every Shift, Apply to bilateral buttocks and sacrum qshift.   benzonatate (TESSALON) 100 MG capsule Take 1 capsule (100 mg total) by mouth 2 (two) times daily as needed for cough.   cholecalciferol (VITAMIN D3) 25 MCG (1000 UNIT) tablet Take 1 tablet (1,000 Units total) by mouth in the morning.   fluticasone (FLONASE) 50 MCG/ACT nasal spray Place 2 sprays into both nostrils daily.   fluticasone-salmeterol (ADVAIR) 250-50 MCG/ACT AEPB Inhale 1 puff into the lungs in the morning and at bedtime.   folic acid (FOLVITE) 1 MG tablet TAKE ONE TABLET BY MOUTH ONCE DAILY.   furosemide (LASIX) 40 MG tablet TAKE (1) TABLET BY MOUTH DAILY.   gabapentin (NEURONTIN) 300 MG capsule TAKE (1) CAPSULE BY MOUTH TWICE DAILY   hydrALAZINE (APRESOLINE) 10 MG tablet TAKE (1) TABLET BY MOUTH EVERY EIGHT HOURS.   isosorbide mononitrate (IMDUR) 30 MG 24 hr tablet TAKE 1/2 TABLET BY MOUTH ONCE DAILY.   levETIRAcetam (KEPPRA) 500 MG tablet TAKE (1) TABLET BY MOUTH TWICE DAILY AT 9AM AND 9PM   metoprolol succinate (TOPROL-XL) 50 MG 24 hr tablet Take 1 tablet (50 mg total) by mouth daily. Take with or immediately following a meal.   nitroGLYCERIN (NITROSTAT) 0.4 MG SL tablet DISSOLVE 1 TABLET SUBLINGUALLY AS NEEDED FOR CHEST PAIN, MAY REPEAT EVERY 5 MINUTES. AFTER 3 CALL 911.   OXYGEN Inhale 3 L into the lungs continuous.   polyethylene glycol (MIRALAX /  GLYCOLAX) 17 g packet Take 17 g by mouth daily as needed.   potassium chloride SA (KLOR-CON) 20 MEQ tablet Take 1 tablet (20 mEq total) by mouth daily. (Patient taking differently: Take 20 mEq by mouth daily. 04/21/21- takes one QOD per pt- per nephrologist order)   pravastatin (PRAVACHOL) 40 MG tablet TAKE ONE TABLET BY MOUTH ONCE DAILY.   predniSONE (DELTASONE) 20 MG tablet Take 2 tablets (40 mg total) by mouth daily.    SYNTHROID 88 MCG tablet TAKE 1 TABLET BEFORE BREAKFAST.   UNABLE TO FIND Compression Stockings R-  Ankle  26.5cm   Calf 38cm  Leg  44cm L-  Ankle  27.5 cm   Calf 39cm  Leg  44cm (Patient taking differently: Compression Stockings R-  Ankle  26.5cm   Calf 38cm  Leg  44cm L-  Ankle  27.5 cm   Calf 39cm  Leg  44cm)   warfarin (COUMADIN) 10 MG tablet Take warfarin 53m tonight then continue 117mdaily except 64m42mn Mondays, Wednesdays and Fridays or as directed   NON FORMULARY Diet :mechanical soft diet with ground meats, NAS, cons CHO (Patient not taking: Reported on 05/19/2021)   ondansetron (ZOFRAN-ODT) 4 MG disintegrating tablet TAKE (1) TABLET BY MOUTH EVERY EIGHT HOURS AS NEEDED FOR NAUSEA OR VOMITING (Patient not taking: Reported on 05/19/2021)   No facility-administered encounter medications on file as of 05/19/2021.    Patient Active Problem List   Diagnosis Date Noted   Protein-calorie malnutrition, moderate (HCCPhillipsburg5/17/2022   Aortic atherosclerosis (HCCNatchitoches5/05/2021   Other idiopathic peripheral autonomic neuropathy 01/31/2021   Anemia due to stage 4 chronic kidney disease (HCCNew Knoxville5/05/2021   Acute renal failure superimposed on stage 4 chronic kidney disease (HCC)    Acute respiratory failure with hypoxia (HCCDupo4/26/2022   Acute respiratory failure with hypoxia and hypercapnia (HCC) 01/18/2021   Gait abnormality 08/24/2020   Chronic constipation 08/09/2020   Hypertensive heart and kidney disease with chronic systolic congestive heart failure and stage 4 chronic kidney disease (HCCSan Lucas0/21/2021   Hyperlipidemia associated with type 2 diabetes mellitus (HCCWaltonville0/21/2021   Seizure (HCCDu Bo[place]0/13/2021   Goals of care, counseling/discussion    Palliative care by specialist    Idiopathic hypotension    Arthritis of right knee 06/20/2020   Leg swelling 06/16/2020   Generalized osteoarthritis 05/09/2020   Primary osteoarthritis, left shoulder 07/35/32/9924Acute systolic CHF (congestive heart  failure) (HCCBy[place]7/03/2020   Bilateral lower extremity edema    Right-sided epistaxis 03/28/2020   Acute pain of left shoulder 01/15/2020   AAA (abdominal aortic aneurysm) without rupture (HCCMartin4/21/2021   COVID-19 virus detected 11/23/2019   Stage 3b chronic kidney disease (HCCWilliamsville1/05/2019   Dyspnea 05/30/2019   Anemia 05/30/2019   Gout    Glaucoma    GERD (gastroesophageal reflux disease)    Physical debility 04/28/2019   Encounter for examination following treatment at hospital 11/03/2018   Hyponatremia 09/27/2018   H/O mitral valve replacement with mechanical valve 08/02/2018   Chronic right hip pain 04/16/2018   COPD with acute exacerbation (HCCWiley Ford2/21/2019   Trochanteric bursitis, right hip 03/07/2017   Acute on chronic combined systolic and diastolic heart failure (HCCClarendon2/22/2018   Persistent atrial fibrillation (HCCAldrich  Nocturnal hypoxia 10/08/2016   Dependence on nocturnal oxygen therapy 10/08/2016   Hematuria 03/20/2016   S/P ICD (internal cardiac defibrillator) procedure 02/08/2015   Presence of cardiac pacemaker 02/08/2015   CHB (complete heart block) (HCCJackson1/13/2016  Fatigue 07/20/2014   Absolute anemia 06/24/2014   Osteopenia 02/10/2014   Encounter for therapeutic drug monitoring 10/29/2013   OA (osteoarthritis) of knee 02/19/2013   Chronic pain of right knee 01/02/2013   Hemolytic anemia ([place]) 09/24/2012   High risk medication use 05/24/2012   COPD (chronic obstructive pulmonary disease) ([place]) 05/19/2012   Chronic anticoagulation, on coumadin for Mechanical AVR and MVR 04/01/2012   Cardiorenal syndrome with renal failure 03/22/2012   Hx of aortic valve replacement, mechanical 03/22/2012   S/P MVR (mitral valve replacement) 03/22/2012   Biventricular automatic implantable cardioverter defibrillator in situ 03/22/2012   CKD (chronic kidney disease), stage IV ([place]) 03/22/2012   Warfarin-induced coagulopathy ([place]) 03/21/2012   Iron deficiency 10/04/2011    Allergic rhinitis 02/14/2011   Mitral valve disease 07/15/2010   Sinus node dysfunction ([employer]) 07/15/2010   Type 2 diabetes mellitus with hypoglycemia without coma ([place]) 07/15/2010   VARICOSE VEINS LOWER EXTREMITIES W/OTH COMPS 02/09/2009   Prediabetes 06/15/2008   Hypothyroidism, postsurgical 02/24/2008   Hyperlipidemia LDL goal <70 02/24/2008   Essential hypertension 02/24/2008   Non-ischemic cardiomyopathy with ICD 02/24/2008   Spondylosis 02/24/2008    Conditions to be addressed/monitored:CHF and HTN  Care Plan : Heart Failure (Adult)  Updates made by Kassie Mends, RN since 05/19/2021 12:00 AM     Problem: Symptom Exacerbation (Heart Failure)   Priority: High     Long-Range Goal: Symptom Exacerbation Prevented or Minimized   Start Date: 10/29/2020  Expected End Date: 11/19/2021  This Visit's Progress: On track  Recent Progress: On track  Priority: High  Note:   Current Barriers:  Knowledge deficits related to basic heart failure pathophysiology and self care management as evidenced by needing reinforcement of HF action plan, pt reports she is weighing daily now, (on occasion misses a day) with weight 193 pounds today, reports "breathing ok but I get short of breath sometimes", continues to use oxygen at 3L via [place] continuously, reports soreness in nose resolved, pt reports no knee pain today and had injection to right knee on 7/26.  Pt has prefilled packs for medications and reports she has everything, patient and caregiver report [place] nurse and physical therapist working with pt as well as palliative care ([facility]) CNA assists patient to apply compression hose each morning as patient cannot independently perform this task, patient reports she does miss a day on occasion "because I like to let my legs have a rest" Nurse Case Manager Clinical Goal(s):  Over the next 30 days, patient will weigh self daily and record Over the next 90 days, patient will verbalize  understanding of Heart Failure Action Plan and when to call doctor Interventions:  Collaboration with Fayrene Helper, MD regarding development and update of comprehensive plan of care as evidenced by provider attestation and co-signature Inter-disciplinary care team collaboration (see longitudinal plan of care) Reviewed education on low sodium diet Reviewed Heart Failure Action Plan with patient and CNA and importance of knowing how you feel each day Reviewed importance of daily weight and recording Reviewed role of diuretics in prevention of fluid overload and importance of taking as prescribed Reinforced importance of wearing compression hose daily and taking off at night  Reinforced taking all medications as prescribed Reviewed upcoming appointments with patient-  Dr. [name] 9/15, Annual Wellness Visit 9/7 Bloodwork 9/7 and injection. Reinforced oxygen safety, importance of no petroleum based products near oxygen (no vaseline) and being mindful of oxygen tubing to avoid falling Reinforced energy conservation-  alternate activity with rest and ask for assistance as needed Patient Goals/Self-Care Activities Continue to be mindful of how you are feeling each day and continue to follow heart failure action plan if symptoms flare up Continue weighing and recording, ask CNA to help you know when to call the doctor- call for weight gain of 3 pounds in one day or 5 pounds in one week. Call RN Care Manager for any questions at [phone] It is important to wear compression hose as directed every day , take them off at night Continue taking medications as prescribed and call for refills before running out You have follow up appointment with Dr. [name] on 9/15, Annual Wellness Visit on 9/7 , bloodwork on 9/7 and injection Use [place] Jelly if oxygen tubing is making your nose sore, do not use petroleum based products, be careful of oxygen tubing so you do not fall Alternate activity with rest and  take your time Continue working with [place] and physical therapist as well as Palliative Care ([facility])  Follow Up Plan: Telephone follow up appointment with care management team member scheduled for:   06/16/2022     Care Plan : Hypertension (Adult)  Updates made by Kassie Mends, RN since 05/19/2021 12:00 AM     Problem: Hypertension (Hypertension)   Priority: Medium     Long-Range Goal: Hypertension Monitored   Start Date: 10/29/2020  Expected End Date: 11/19/2021  This Visit's Progress: On track  Recent Progress: Not on track  Priority: Medium  Note:   Objective:  Last practice recorded BP readings:  BP Readings from Last 3 Encounters:  10/27/20 (!) 153/77  10/26/20 (!) 145/75  09/28/20 133/72  Most recent eGFR/CrCl: No results found for: EGFR  No components found for: CRCL Current Barriers:  Knowledge Deficits related to basic understanding of hypertension pathophysiology and self care management as evidenced by continued reports of elevated blood pressures- pt reports in home aide checks blood pressure but not always checked daily, pt reports "I'm doing ok today", eating well, sleeping well, denies any falls, continues to follow up for labwork and retacrit injection. Pt reports she is wearing compression hose daily, reports she has all medication and taking as prescribed, reports [place] and PT are working with her as well as [place] (sometimes gets mixed up on who is who coming into her home as aide is not always present when patient gets a visit) Unable to perform ADLs independently- has [facility] home care aide that assists with checking blood pressure several times weekly, aide stays with pt 9 am- 1 pm Monday- Friday. Case Manager Clinical Goal(s):  Over the next 60 days, patient will demonstrate improved adherence to prescribed treatment plan for hypertension as evidenced by taking all medications as prescribed, monitoring and  recording blood pressure as directed, adhering to low sodium/DASH diet Interventions:  Collaboration with Fayrene Helper, MD regarding development and update of comprehensive plan of care as evidenced by provider attestation and co-signature Inter-disciplinary care team collaboration (see longitudinal plan of care) Encouraged patient to call RN care manager with any questions Reviewed medications with patient and reinforced importance of compliance Reinforced with patient, caregiver (aide)  providing education and rationale, to monitor blood pressure at least 3 times per week and record (with assistance of CNA), calling PCP for findings outside established parameters, systolic >979, diastolic >89. Reviewed low salt diet and importance of adherence Encouraged patient to walk some daily even if for a  few minutes, alternate activity with rest Reviewed all upcoming appointments including primary care provider, annual wellness visit, blood work, injection. Reinforced patient (and caregiver) to wear compression hose daily and elevate legs when she can  Patient Goals/Self-Care Activities Continue checking blood pressure at least 3 times per week with the assistance of in home aide, record in log or diary Call primary care doctor for any readings outside of normal limits systolic >329, diastolic >92 Continue to follow a low salt diet- avoid fast food, salty chips, fries, etc. Continue to follow up with your doctor - Annual Wellness Visit 9/7 and primary care doctor 9/15, labwork and retacrit injection 9/7 Take all medications as prescribed and keep timely refills Try to do some type of light exercise daily even if for a few minutes (walking is good) Continue working with [place] and physical therapist- do prescribed exercises Follow Up Plan: Telephone follow up appointment with care management team member scheduled for:   06/16/2021     Plan:Telephone follow up appointment with  care management team member scheduled for:  06/16/2021  Jacqlyn Larsen [facility], BSN RN Case Manager [facility] [phone]

## 2021-05-19 NOTE — Patient Instructions (Signed)
Visit Information  PATIENT GOALS:  Goals Addressed             This Visit's Progress    Track and Manage My Blood Pressure-Hypertension       Timeframe:  Long-Range Goal Priority:  Medium Start Date:      10/29/2020                       Expected End Date:  11/19/2021  Follow up- 06/16/21                 Continue checking blood pressure at least 3 times per week with the assistance of in home aide, record in log or diary Call primary care doctor for any readings outside of normal limits systolic >149, diastolic >70 Continue to follow a low salt diet- avoid fast food, salty chips, fries, etc. Continue to follow up with your doctor - Annual Wellness Visit 9/7 and primary care doctor 9/15, labwork and retacrit injection 9/7 Take all medications as prescribed and keep timely refills Try to do some type of light exercise daily even if for a few minutes (walking is good) Continue working with [place] and physical therapist- do prescribed exercises   Why is this important?   You won't feel high blood pressure, but it can still hurt your blood vessels.  High blood pressure can cause heart or kidney problems. It can also cause a stroke.  Making lifestyle changes like losing a little weight or eating less salt will help.  Checking your blood pressure at home and at different times of the day can help to control blood pressure.  If the doctor prescribes medicine remember to take it the way the doctor ordered.  Call the office if you cannot afford the medicine or if there are questions about it.         Track and Manage Symptoms-Heart Failure       Timeframe:  Long-Range Goal Priority:  High Start Date:       10/29/2020                      Expected End Date:   11/19/2021                    Follow Up Date 06/16/2021   Continue to be mindful of how you are feeling each day and continue to follow heart failure action plan if symptoms flare up Continue weighing and recording,  ask CNA to help you know when to call the doctor- call for weight gain of 3 pounds in one day or 5 pounds in one week. Call RN Care Manager for any questions at [phone] It is important to wear compression hose as directed every day , take them off at night Continue taking medications as prescribed and call for refills before running out You have follow up appointment with Dr. [name] on 9/15, Annual Wellness Visit on 9/7 , bloodwork on 9/7 and injection Use [place] Jelly if oxygen tubing is making your nose sore, do not use petroleum based products, be careful of oxygen tubing so you do not fall Alternate activity with rest and take your time Continue working with [place] and physical therapist as well as Palliative Care ([facility])   Why is this important?   You will be able to handle your symptoms better if you keep track of them.  Making some simple changes to your  lifestyle will help.  Eating healthy is one thing you can do to take good care of yourself.            The patient verbalized understanding of instructions, educational materials, and care plan provided today and declined offer to receive copy of patient instructions, educational materials, and care plan.   Telephone follow up appointment with care management team member scheduled for:   06/16/2021  Jacqlyn Larsen [facility], BSN RN Case Manager [facility] [phone]

## 2021-05-23 ENCOUNTER — Emergency Department (HOSPITAL_COMMUNITY): Payer: Medicare Other

## 2021-05-23 ENCOUNTER — Encounter (HOSPITAL_COMMUNITY): Payer: Self-pay | Admitting: *Deleted

## 2021-05-23 ENCOUNTER — Other Ambulatory Visit: Payer: Self-pay

## 2021-05-23 ENCOUNTER — Ambulatory Visit (INDEPENDENT_AMBULATORY_CARE_PROVIDER_SITE_OTHER): Payer: Medicare Other

## 2021-05-23 ENCOUNTER — Emergency Department (HOSPITAL_COMMUNITY)
Admission: EM | Admit: 2021-05-23 | Discharge: 2021-05-23 | Disposition: A | Discharge status: Home / Self Care | Payer: Medicare Other | Attending: Emergency Medicine | Admitting: Emergency Medicine

## 2021-05-23 DIAGNOSIS — I5042 Chronic combined systolic (congestive) and diastolic (congestive) heart failure: Secondary | ICD-10-CM

## 2021-05-23 DIAGNOSIS — Z20822 Contact with and (suspected) exposure to covid-19: Secondary | ICD-10-CM | POA: Insufficient documentation

## 2021-05-23 DIAGNOSIS — I110 Hypertensive heart disease with heart failure: Secondary | ICD-10-CM | POA: Diagnosis not present

## 2021-05-23 DIAGNOSIS — Z853 Personal history of malignant neoplasm of breast: Secondary | ICD-10-CM | POA: Diagnosis not present

## 2021-05-23 DIAGNOSIS — R0602 Shortness of breath: Secondary | ICD-10-CM | POA: Insufficient documentation

## 2021-05-23 DIAGNOSIS — E1122 Type 2 diabetes mellitus with diabetic chronic kidney disease: Secondary | ICD-10-CM | POA: Insufficient documentation

## 2021-05-23 DIAGNOSIS — Z950 Presence of cardiac pacemaker: Secondary | ICD-10-CM

## 2021-05-23 DIAGNOSIS — Z8616 Personal history of COVID-19: Secondary | ICD-10-CM | POA: Insufficient documentation

## 2021-05-23 DIAGNOSIS — E11649 Type 2 diabetes mellitus with hypoglycemia without coma: Secondary | ICD-10-CM | POA: Diagnosis not present

## 2021-05-23 DIAGNOSIS — Z79899 Other long term (current) drug therapy: Secondary | ICD-10-CM | POA: Diagnosis not present

## 2021-05-23 DIAGNOSIS — N185 Chronic kidney disease, stage 5: Secondary | ICD-10-CM | POA: Diagnosis not present

## 2021-05-23 DIAGNOSIS — Z7901 Long term (current) use of anticoagulants: Secondary | ICD-10-CM | POA: Insufficient documentation

## 2021-05-23 DIAGNOSIS — E1169 Type 2 diabetes mellitus with other specified complication: Secondary | ICD-10-CM | POA: Diagnosis not present

## 2021-05-23 DIAGNOSIS — E785 Hyperlipidemia, unspecified: Secondary | ICD-10-CM | POA: Diagnosis not present

## 2021-05-23 DIAGNOSIS — E890 Postprocedural hypothyroidism: Secondary | ICD-10-CM | POA: Insufficient documentation

## 2021-05-23 DIAGNOSIS — I517 Cardiomegaly: Secondary | ICD-10-CM | POA: Diagnosis not present

## 2021-05-23 DIAGNOSIS — Z8709 Personal history of other diseases of the respiratory system: Secondary | ICD-10-CM

## 2021-05-23 DIAGNOSIS — J449 Chronic obstructive pulmonary disease, unspecified: Secondary | ICD-10-CM | POA: Diagnosis not present

## 2021-05-23 DIAGNOSIS — I132 Hypertensive heart and chronic kidney disease with heart failure and with stage 5 chronic kidney disease, or end stage renal disease: Secondary | ICD-10-CM | POA: Insufficient documentation

## 2021-05-23 DIAGNOSIS — J441 Chronic obstructive pulmonary disease with (acute) exacerbation: Secondary | ICD-10-CM | POA: Diagnosis not present

## 2021-05-23 DIAGNOSIS — R059 Cough, unspecified: Secondary | ICD-10-CM | POA: Diagnosis not present

## 2021-05-23 LAB — CBC WITH DIFFERENTIAL/PLATELET
Abs Immature Granulocytes: 0.01 10*3/uL (ref 0.00–0.07)
Basophils Absolute: 0 10*3/uL (ref 0.0–0.1)
Basophils Relative: 0 %
Eosinophils Absolute: 0 10*3/uL (ref 0.0–0.5)
Eosinophils Relative: 0 %
HCT: 35.6 % — ABNORMAL LOW (ref 36.0–46.0)
Hemoglobin: 10.8 g/dL — ABNORMAL LOW (ref 12.0–15.0)
Immature Granulocytes: 0 %
Lymphocytes Relative: 14 %
Lymphs Abs: 0.8 10*3/uL (ref 0.7–4.0)
MCH: 28.8 pg (ref 26.0–34.0)
MCHC: 30.3 g/dL (ref 30.0–36.0)
MCV: 94.9 fL (ref 80.0–100.0)
Monocytes Absolute: 0.5 10*3/uL (ref 0.1–1.0)
Monocytes Relative: 9 %
Neutro Abs: 4.3 10*3/uL (ref 1.7–7.7)
Neutrophils Relative %: 77 %
Platelets: 165 10*3/uL (ref 150–400)
RBC: 3.75 MIL/uL — ABNORMAL LOW (ref 3.87–5.11)
RDW: 19.6 % — ABNORMAL HIGH (ref 11.5–15.5)
WBC: 5.6 10*3/uL (ref 4.0–10.5)
nRBC: 0 % (ref 0.0–0.2)

## 2021-05-23 LAB — COMPREHENSIVE METABOLIC PANEL
ALT: 13 U/L (ref 0–44)
AST: 24 U/L (ref 15–41)
Albumin: 3.7 g/dL (ref 3.5–5.0)
Alkaline Phosphatase: 67 U/L (ref 38–126)
Anion gap: 7 (ref 5–15)
BUN: 52 mg/dL — ABNORMAL HIGH (ref 8–23)
CO2: 31 mmol/L (ref 22–32)
Calcium: 9.1 mg/dL (ref 8.9–10.3)
Chloride: 100 mmol/L (ref 98–111)
Creatinine, Ser: 1.63 mg/dL — ABNORMAL HIGH (ref 0.44–1.00)
GFR, Estimated: 30 mL/min — ABNORMAL LOW (ref >60)
Glucose, Bld: 98 mg/dL (ref 70–99)
Potassium: 5.2 mmol/L — ABNORMAL HIGH (ref 3.5–5.1)
Sodium: 138 mmol/L (ref 135–145)
Total Bilirubin: 0.4 mg/dL (ref 0.3–1.2)
Total Protein: 7.9 g/dL (ref 6.5–8.1)

## 2021-05-23 LAB — RESP PANEL BY RT-PCR (FLU A&B, COVID) ARPGX2
Influenza A by PCR: NEGATIVE
Influenza B by PCR: NEGATIVE
SARS Coronavirus 2 by RT PCR: NEGATIVE

## 2021-05-23 LAB — BRAIN NATRIURETIC PEPTIDE: B Natriuretic Peptide: 467 pg/mL — ABNORMAL HIGH (ref 0.0–100.0)

## 2021-05-23 LAB — PROTIME-INR
INR: 2.2 — ABNORMAL HIGH (ref 0.8–1.2)
Prothrombin Time: 24 seconds — ABNORMAL HIGH (ref 11.4–15.2)

## 2021-05-23 MED ORDER — ALBUTEROL SULFATE HFA 108 (90 BASE) MCG/ACT IN AERS
4.0000 | INHALATION_SPRAY | Freq: Once | RESPIRATORY_TRACT | Status: AC
  Administered 2021-05-23: 4 via RESPIRATORY_TRACT
  Filled 2021-05-23: qty 6.7

## 2021-05-23 MED ORDER — METHYLPREDNISOLONE SODIUM SUCC 125 MG IJ SOLR
80.0000 mg | Freq: Once | INTRAMUSCULAR | Status: AC
  Administered 2021-05-23: 80 mg via INTRAVENOUS
  Filled 2021-05-23: qty 2

## 2021-05-23 NOTE — ED Triage Notes (Signed)
Pt with SOB x 2 days, hx COPD.  Cough at times and is productive at times.  Pt is on home O2 at 3 L/M currently, pt states it was 2.5 L/M.

## 2021-05-23 NOTE — ED Provider Notes (Signed)
[facility] EMERGENCY DEPARTMENT Provider Note   CSN: [id number] Arrival date & time: 05/23/21  1412     History Chief Complaint  Patient presents with   Shortness of Breath    [name] [name] is a 87 y.o. female.  Patient w hx chf, copd, c/o sob in past few days. Symptoms gradual onset, mild-mod, constant, persistent. Denies chest pain or discomfort. No increased cough or uri symptoms. No fever or chills. No increased leg swelling or pain. Compliant w home meds. Denies vomiting or diarrhea. No gu c/o.   The history is provided by the patient and medical records.  Shortness of Breath Associated symptoms: no abdominal pain, no chest pain, no fever, no headaches, no neck pain, no rash, no sore throat and no vomiting       Past Medical History:  Diagnosis Date   Acute on chronic diastolic CHF (congestive heart failure) ([place]) 05/19/2012   Acute on chronic respiratory failure with hypoxia ([place]) 01/31/2020   Adenomatous polyp of colon 12/16/2002   Dr. [name]/[facility]   Allergy    Calculus of gallbladder with chronic cholecystitis without obstruction 02/09/2009   Qualifier: Diagnosis of  By: [name], Margaret     Cataract    CHB (complete heart block) ([place]) 10/07/2014       CHF (congestive heart failure) ([place])    a.  EF previously reduced to 25-30% b. EF improved to 60-65% by echo in 10/2017.    Chronic gout due to renal impairment    Per [employer], [facility] Electronic [facility] Records [facility]    Chronic kidney disease, stage I 2006   Cognitive communication deficit    Per [employer], [facility] Electronic [facility] Records [facility]    Constipation        Convulsion ([place])    Per [employer], [facility] Electronic [facility] Records [facility]    COPD (chronic obstructive pulmonary disease) ([place])        DJD (degenerative joint disease) of lumbar spine    DM (diabetes mellitus) ([place]) 2006   Dysphagia, unspecified    Per [employer],  [facility] Electronic Medical Records [facility]    Esophageal reflux    Glaucoma    Gout        H/O adenomatous polyp of colon 05/24/2012   2004 in [place] Last colonoscopy 2008 normal     H/O heart valve replacement with mechanical valve    a. s/p mechanical AVR in 2001 and mechanical MVR in 2004.   H/O wisdom tooth extraction    HIP PAIN, RIGHT 04/23/2009   Qualifier: Diagnosis of  By: [name], Margaret     Hyperkalemia    Per Zadie Cleverly, [facility] Electronic Medical Records System    Hyperlipemia    Hypertensive heart and kidney disease with HF and with CKD stage I-IV ([place])    Per [employer], [facility] Electronic Medical Records [facility]    IDA (iron deficiency anemia)    Parenteral iron/[name]   Insomnia        Localized edema    Left arm, Per [employer], [facility] Electronic Medical Records [facility]    Long term (current) use of anticoagulants    Per [employer], [facility] Electronic Medical Records System    Nausea without vomiting 06/07/2010   Qualifier: Diagnosis of  By: [name], Margaret     Non-ischemic cardiomyopathy [facility])    a. s/p MDT CRTD   Obesity, unspecified  Oxygen deficiency 2014   nocturnal;   Papilloma of breast 03/06/2012   This was diagnosed as a left breast papilloma by ultrasound characteristics and symptoms in 2010. Because of possible medical risk factors no surgical intervention has been done and it did doing annual followups. The area has been stable by physical examination and mammograms and ultrasound since 2010.    Presence of cardiac pacemaker    Per [employer], [facility] Electronic Medical Records System    Spinal stenosis of lumbar region 02/19/2013   Spondylolisthesis    Spondylosis    Type 2 diabetes mellitus with hyperglycemia ([place]) 07/15/2010   Type 2 diabetes mellitus with hypoglycemia without coma ([place])    Per [employer], [facility] Electronic Medical Records [facility]     Unspecified chronic bronchitis ([place])    Per [employer], [facility] Electronic Medical Records [facility]    Unspecified hypothyroidism        Urinary tract infection, site not specified    Per [employer], [facility] Electronic Medical Records [facility]    Varicose veins of lower extremities with other complications    Vertigo         Patient Active Problem List   Diagnosis Date Noted   Protein-calorie malnutrition, moderate ([place]) 02/08/2021   Aortic atherosclerosis ([place]) 01/31/2021   Other idiopathic peripheral autonomic neuropathy 01/31/2021   Anemia due to stage 4 chronic kidney disease ([place]) 01/31/2021   Acute renal failure superimposed on stage 4 chronic kidney disease (HCC)    Acute respiratory failure with hypoxia ([place]) 01/18/2021   Acute respiratory failure with hypoxia and hypercapnia ([place]) 01/18/2021   Gait abnormality 08/24/2020   Chronic constipation 08/09/2020   Hypertensive heart and kidney disease with chronic systolic congestive heart failure and stage 4 chronic kidney disease ([place]) 07/15/2020   Hyperlipidemia associated with type 2 diabetes mellitus ([place]) 07/15/2020   Seizure ([place]) 07/07/2020   Goals of care, counseling/discussion    Palliative care by specialist    Idiopathic hypotension    Arthritis of right knee 06/20/2020   Leg swelling 06/16/2020   Generalized osteoarthritis 05/09/2020   Primary osteoarthritis, left shoulder 23/30/0762   Acute systolic CHF (congestive heart failure) ([place]) 03/31/2020   Bilateral lower extremity edema    Right-sided epistaxis 03/28/2020   Acute pain of left shoulder 01/15/2020   AAA (abdominal aortic aneurysm) without rupture ([place]) 01/14/2020   COVID-19 virus detected 11/23/2019   Stage 3b chronic kidney disease ([place]) 08/04/2019   Dyspnea 05/30/2019   Anemia 05/30/2019   Gout    Glaucoma    GERD (gastroesophageal reflux disease)    Physical debility 04/28/2019   Encounter for examination following treatment at  hospital 11/03/2018   Hyponatremia 09/27/2018   H/O mitral valve replacement with mechanical valve 08/02/2018   Chronic right hip pain 04/16/2018   COPD with acute exacerbation ([place]) 11/15/2017   Trochanteric bursitis, right hip 03/07/2017   Acute on chronic combined systolic and diastolic heart failure ([place]) 11/16/2016   Persistent atrial fibrillation ([place])    Nocturnal hypoxia 10/08/2016   Dependence on nocturnal oxygen therapy 10/08/2016   Hematuria 03/20/2016   S/P ICD (internal cardiac defibrillator) procedure 02/08/2015   Presence of cardiac pacemaker 02/08/2015   CHB (complete heart block) ([place]) 10/07/2014   Fatigue 07/20/2014   Absolute anemia 06/24/2014   Osteopenia 02/10/2014   Encounter for therapeutic drug monitoring 10/29/2013   OA (osteoarthritis) of knee 02/19/2013   Chronic pain of right knee 01/02/2013   Hemolytic anemia (  [place]) 09/24/2012   High risk medication use 05/24/2012   COPD (chronic obstructive pulmonary disease) ([place]) 05/19/2012   Chronic anticoagulation, on coumadin for Mechanical AVR and MVR 04/01/2012   Cardiorenal syndrome with renal failure 03/22/2012   Hx of aortic valve replacement, mechanical 03/22/2012   S/P MVR (mitral valve replacement) 03/22/2012   Biventricular automatic implantable cardioverter defibrillator in situ 03/22/2012   CKD (chronic kidney disease), stage IV ([place]) 03/22/2012   Warfarin-induced coagulopathy ([place]) 03/21/2012   Iron deficiency 10/04/2011   Allergic rhinitis 02/14/2011   Mitral valve disease 07/15/2010   Sinus node dysfunction ([place]) 07/15/2010   Type 2 diabetes mellitus with hypoglycemia without coma ([place]) 07/15/2010   VARICOSE VEINS LOWER EXTREMITIES W/OTH COMPS 02/09/2009   Prediabetes 06/15/2008   Hypothyroidism, postsurgical 02/24/2008   Hyperlipidemia LDL goal <70 02/24/2008   Essential hypertension 02/24/2008   Non-ischemic cardiomyopathy with ICD 02/24/2008   Spondylosis 02/24/2008    Past Surgical  History:  Procedure Laterality Date   AORTIC AND MITRAL VALVE REPLACEMENT  2001   BI-VENTRICULAR IMPLANTABLE CARDIOVERTER DEFIBRILLATOR UPGRADE N/A 01/18/2015   Procedure: BI-VENTRICULAR IMPLANTABLE CARDIOVERTER DEFIBRILLATOR UPGRADE;  Surgeon: Evans Lance, MD;  Location: [facility] CATH LAB;  Service: Cardiovascular;  Laterality: N/A;   BIV ICD GENERATOR CHANGEOUT N/A 01/30/2019   Procedure: BIV ICD GENERATOR CHANGEOUT;  Surgeon: Evans Lance, MD;  Location: [facility] CV LAB;  Service: Cardiovascular;  Laterality: N/A;   CARDIAC CATHETERIZATION     CARDIAC VALVE REPLACEMENT     CARDIOVERSION N/A 11/13/2016   Procedure: CARDIOVERSION;  Surgeon: Sanda Klein, MD;  Location: [facility] ENDOSCOPY;  Service: Cardiovascular;  Laterality: N/A;   COLONOSCOPY  06/12/2007   Rourk- Normal rectum/Normal colon   COLONOSCOPY  12/16/02   small hemorrhoids   COLONOSCOPY  06/20/2012   Procedure: COLONOSCOPY;  Surgeon: Daneil Dolin, MD;  [facility];  Service: Endoscopy;  Laterality: N/A;  10:45   defibrillator implanted 2006     DOPPLER ECHOCARDIOGRAPHY  2012   DOPPLER ECHOCARDIOGRAPHY  05,06,07,08,09,2011   ESOPHAGOGASTRODUODENOSCOPY  06/12/2007   Rourk- normal esophagus, small hiatal hernia, otherwise normal stomach, D1, D2   EYE SURGERY Right 2005   EYE SURGERY Left 2006   ICD LEAD REMOVAL Left 02/08/2015   Procedure: ICD LEAD REMOVAL;  Surgeon: Evans Lance, MD;  Location: [facility];  Service: Cardiovascular;  Laterality: Left;  "Will plan extraction and insertion of a BiV PM"  **Dr. Roxan Hockey backing up case**   IMPLANTABLE CARDIOVERTER DEFIBRILLATOR (ICD) GENERATOR CHANGE Left 02/08/2015   Procedure: ICD GENERATOR CHANGE;  Surgeon: Evans Lance, MD;  Location: [facility];  Service: Cardiovascular;  Laterality: Left;   INSERT / REPLACE / REMOVE PACEMAKER     PACEMAKER INSERTION  May 2016   pacemaker placed  2004   right breast cyst removed     benign    right cataract removed     2005   TEE  WITHOUT CARDIOVERSION N/A 11/13/2016   Procedure: TRANSESOPHAGEAL ECHOCARDIOGRAM (TEE);  Surgeon: Sanda Klein, MD;  Location: [facility] ENDOSCOPY;  Service: Cardiovascular;  Laterality: N/A;   THYROIDECTOMY       OB History     Gravida      Para      Term      Preterm      AB      Living  0      SAB      IAB      Ectopic  Multiple      Live Births              Family History  Problem Relation Age of Onset   Pancreatic cancer Mother    Heart disease Father    Heart disease Sister    Heart attack Sister    Heart disease Brother    Diabetes Brother    Heart disease Brother    Diabetes Brother    Hypertension Brother    Lung cancer Brother     Social History   Tobacco Use   Smoking status: Former    Packs/day: 0.75    Years: 40.00    Pack years: 30.00    Types: Cigarettes    Quit date: 03/08/1987    Years since quitting: 34.2   Smokeless tobacco: Never  Vaping Use   Vaping Use: Never used  Substance Use Topics   Alcohol use: No    Alcohol/week: 0.0 standard drinks   Drug use: No    Home Medications Prior to Admission medications   Medication Sig Start Date End Date Taking? Authorizing Provider  acetaminophen (TYLENOL) 500 MG tablet Take 500 mg by mouth every 6 (six) hours as needed.    [provider]  albuterol (PROVENTIL) (2.5 MG/3ML) 0.083% nebulizer solution Take 3 mLs (2.5 mg total) by nebulization in the morning and at bedtime. 03/02/21   Gerlene Fee, NP  albuterol (VENTOLIN HFA) 108 (90 Base) MCG/ACT inhaler Inhale 2 puffs into the lungs every 4 (four) hours as needed for wheezing or shortness of breath. 04/04/21   Fayrene Helper, MD  allopurinol (ZYLOPRIM) 100 MG tablet Take 1 tablet (100 mg total) by mouth daily. 04/12/21   Fayrene Helper, MD  Amino Acids-Protein Hydrolys (FEEDING SUPPLEMENT, PRO-STAT SUGAR FREE 64,) LIQD Take 30 mLs by mouth 2 (two) times daily between meals.    [provider]  Janne Lab Oil [facility]) OINT Apply topically. topical, Every Shift, Apply to bilateral buttocks and sacrum qshift.    [provider]  benzonatate (TESSALON) 100 MG capsule Take 1 capsule (100 mg total) by mouth 2 (two) times daily as needed for cough. 03/02/21   Gerlene Fee, NP  cholecalciferol (VITAMIN D3) 25 MCG (1000 UNIT) tablet Take 1 tablet (1,000 Units total) by mouth in the morning. 07/08/20   Desiree Hane, MD  fluticasone (FLONASE) 50 MCG/ACT nasal spray Place 2 sprays into both nostrils daily.    [provider]  fluticasone-salmeterol (ADVAIR) 250-50 MCG/ACT AEPB Inhale 1 puff into the lungs in the morning and at bedtime. 03/02/21   Gerlene Fee, NP  folic acid (FOLVITE) 1 MG tablet TAKE ONE TABLET BY MOUTH ONCE DAILY. 05/09/21   Fayrene Helper, MD  furosemide (LASIX) 40 MG tablet TAKE (1) TABLET BY MOUTH DAILY. 05/09/21   Fayrene Helper, MD  gabapentin (NEURONTIN) 300 MG capsule TAKE (1) CAPSULE BY MOUTH TWICE DAILY 05/09/21   Fayrene Helper, MD  hydrALAZINE (APRESOLINE) 10 MG tablet TAKE (1) TABLET BY MOUTH EVERY EIGHT HOURS. 05/09/21   Fayrene Helper, MD  isosorbide mononitrate (IMDUR) 30 MG 24 hr tablet TAKE 1/2 TABLET BY MOUTH ONCE DAILY. 05/09/21   Fayrene Helper, MD  levETIRAcetam (KEPPRA) 500 MG tablet TAKE (1) TABLET BY MOUTH TWICE DAILY AT 9AM AND 9PM 05/09/21   Fayrene Helper, MD  metoprolol succinate (TOPROL-XL) 50 MG 24 hr tablet Take 1 tablet (50 mg total) by mouth daily. Take with or  immediately following a meal. 04/12/21   Fayrene Helper, MD  nitroGLYCERIN (NITROSTAT) 0.4 MG SL tablet DISSOLVE 1 TABLET SUBLINGUALLY AS NEEDED FOR CHEST PAIN, MAY REPEAT EVERY 5 MINUTES. AFTER 3 CALL 911. 03/02/21   Gerlene Fee, NP  NON FORMULARY Diet :mechanical soft diet with ground meats, NAS, cons CHO Patient not taking: Reported on 05/19/2021 07/26/20   [provider]  ondansetron (ZOFRAN-ODT) 4 MG disintegrating tablet  TAKE (1) TABLET BY MOUTH EVERY EIGHT HOURS AS NEEDED FOR NAUSEA OR VOMITING Patient not taking: Reported on 05/19/2021 04/04/21   Fayrene Helper, MD  OXYGEN Inhale 3 L into the lungs continuous. 07/08/20   [provider]  polyethylene glycol (MIRALAX / GLYCOLAX) 17 g packet Take 17 g by mouth daily as needed. 07/08/20   Oretha Milch D, MD  potassium chloride SA (KLOR-CON) 20 MEQ tablet Take 1 tablet (20 mEq total) by mouth daily. Patient taking differently: Take 20 mEq by mouth daily. 04/21/21- takes one QOD per pt- per nephrologist order 04/12/21   Fayrene Helper, MD  pravastatin (PRAVACHOL) 40 MG tablet TAKE ONE TABLET BY MOUTH ONCE DAILY. 04/12/21   Fayrene Helper, MD  predniSONE (DELTASONE) 20 MG tablet Take 2 tablets (40 mg total) by mouth daily. 04/02/21   Noemi Chapel, MD  SYNTHROID 88 MCG tablet TAKE 1 TABLET BEFORE BREAKFAST. 04/12/21   Fayrene Helper, MD  UNABLE TO FIND Compression Stockings R-  Ankle  26.5cm   Calf 38cm  Leg  44cm L-  Ankle  27.5 cm   Calf 39cm  Leg  44cm Patient taking differently: Compression Stockings R-  Ankle  26.5cm   Calf 38cm  Leg  44cm L-  Ankle  27.5 cm   Calf 39cm  Leg  44cm 09/15/20   Fayrene Helper, MD  warfarin (COUMADIN) 10 MG tablet Take warfarin 15mg  tonight then continue 10mg  daily except 5mg  on Mondays, Wednesdays and Fridays or as directed 04/12/21   Arnoldo Lenis, MD    Allergies    Penicillins, Aspirin, Fentanyl, and Niacin  Review of Systems   Review of Systems  Constitutional:  Negative for chills and fever.  HENT:  Negative for sore throat.   Eyes:  Negative for redness.  Respiratory:  Positive for shortness of breath.   Cardiovascular:  Negative for chest pain and leg swelling.  Gastrointestinal:  Negative for abdominal pain, diarrhea and vomiting.  Genitourinary:  Negative for dysuria and flank pain.  Musculoskeletal:  Negative for back pain and neck pain.  Skin:  Negative for rash.  Neurological:   Negative for headaches.  Hematological:  Does not bruise/bleed easily.  Psychiatric/Behavioral:  Negative for confusion.    Physical Exam Updated Vital Signs BP 121/60   Pulse 60   Temp (!) 97.5 F (36.4 C) (Oral)   Resp 19   Ht 1.702 m (5\' 7" )   Wt 87.5 kg   SpO2 97%   BMI 30.23 kg/m   Physical Exam Vitals and nursing note reviewed.  Constitutional:      Appearance: Normal appearance. She is well-developed.  HENT:     Head: Atraumatic.     Nose: Nose normal.     Mouth/Throat:     Mouth: Mucous membranes are moist.  Eyes:     General: No scleral icterus.    Conjunctiva/sclera: Conjunctivae normal.  Neck:     Trachea: No tracheal deviation.  Cardiovascular:     Rate and Rhythm: Normal rate and regular  rhythm.     Pulses: Normal pulses.     Heart sounds: Normal heart sounds. No murmur heard.   No friction rub. No gallop.  Pulmonary:     Effort: Pulmonary effort is normal. No respiratory distress.     Breath sounds: Normal breath sounds.     Comments: ?sl wheeze. Abdominal:     General: Bowel sounds are normal. There is no distension.     Palpations: Abdomen is soft.     Tenderness: There is no abdominal tenderness. There is no guarding.  Genitourinary:    Comments: No cva tenderness.  Musculoskeletal:     Cervical back: Normal range of motion and neck supple. No rigidity. No muscular tenderness.     Comments: Mild symmetric bilateral foot/ankle swelling - pt indicates is baseline.   Skin:    General: Skin is warm and dry.     Findings: No rash.  Neurological:     Mental Status: She is alert.     Comments: Alert, speech normal.   Psychiatric:        Mood and Affect: Mood normal.    ED Results / Procedures / Treatments   Labs (all labs ordered are listed, but only abnormal results are displayed) Results for orders placed or performed during the hospital encounter of 05/23/21  CBC with Differential  Result Value Ref Range   WBC 5.6 4.0 - 10.5 K/uL   RBC  3.75 (L) 3.87 - 5.11 MIL/uL   Hemoglobin 10.8 (L) 12.0 - 15.0 g/dL   HCT 35.6 (L) 36.0 - 46.0 %   MCV 94.9 80.0 - 100.0 fL   MCH 28.8 26.0 - 34.0 pg   MCHC 30.3 30.0 - 36.0 g/dL   RDW 19.6 (H) 11.5 - 15.5 %   Platelets 165 150 - 400 K/uL   nRBC 0.0 0.0 - 0.2 %   Neutrophils Relative % 77 %   Neutro Abs 4.3 1.7 - 7.7 K/uL   Lymphocytes Relative 14 %   Lymphs Abs 0.8 0.7 - 4.0 K/uL   Monocytes Relative 9 %   Monocytes Absolute 0.5 0.1 - 1.0 K/uL   Eosinophils Relative 0 %   Eosinophils Absolute 0.0 0.0 - 0.5 K/uL   Basophils Relative 0 %   Basophils Absolute 0.0 0.0 - 0.1 K/uL   Immature Granulocytes 0 %   Abs Immature Granulocytes 0.01 0.00 - 0.07 K/uL  Comprehensive metabolic panel  Result Value Ref Range   Sodium 138 135 - 145 mmol/L   Potassium 5.2 (H) 3.5 - 5.1 mmol/L   Chloride 100 98 - 111 mmol/L   CO2 31 22 - 32 mmol/L   Glucose, Bld 98 70 - 99 mg/dL   BUN 52 (H) 8 - 23 mg/dL   Creatinine, Ser 1.63 (H) 0.44 - 1.00 mg/dL   Calcium 9.1 8.9 - 10.3 mg/dL   Total Protein 7.9 6.5 - 8.1 g/dL   Albumin 3.7 3.5 - 5.0 g/dL   AST 24 15 - 41 U/L   ALT 13 0 - 44 U/L   Alkaline Phosphatase 67 38 - 126 U/L   Total Bilirubin 0.4 0.3 - 1.2 mg/dL   GFR, Estimated 30 (L) >60 mL/min   Anion gap 7 5 - 15  Brain natriuretic peptide  Result Value Ref Range   B Natriuretic Peptide 467.0 (H) 0.0 - 100.0 pg/mL  Protime-INR  Result Value Ref Range   Prothrombin Time 24.0 (H) 11.4 - 15.2 seconds   INR 2.2 (H) 0.8 -  1.2   *Note: Due to a large number of results and/or encounters for the requested time period, some results have not been displayed. A complete set of results can be found in Results Review.   DG Chest Port 1 View  Result Date: 05/23/2021 CLINICAL DATA:  Shortness of breath, cough, COPD EXAM: PORTABLE CHEST 1 VIEW COMPARISON:  04/02/2021 FINDINGS: Cardiomegaly status post median sternotomy with aortic valve prosthesis and left chest multi lead pacer defibrillator. Both lungs  are clear. The visualized skeletal structures are unremarkable. IMPRESSION: Cardiomegaly without acute abnormality of the lungs in AP portable projection. Electronically Signed   By: Eddie Candle M.D.   On: 05/23/2021 15:30   CUP PACEART REMOTE DEVICE CHECK  Result Date: 05/13/2021 Scheduled remote reviewed. LV threshold 3.5 V 1.65ms, programmed 3.0V @ 1.0MS, route to triage. Next remote 91 days. LR LOV 01/11/21 with Dr. Lovena Le- LV threshold noted to be elevated, vector changed and device programmed with 1/2 v safety margin.  Patient scheduled for OV with Dr. Sallyanne Kuster on 06/06/21.   AET, RN    EKG EKG Interpretation  Date/Time:  Monday May 23 2021 17:09:49 EDT Ventricular Rate:  60 PR Interval:  59 QRS Duration: 182 QT Interval:  545 QTC Calculation: 545 R Axis:   104 Text Interpretation: Electronic ventricular pacemaker Confirmed by Lajean Saver [phone]) on 05/23/2021 5:18:19 PM  Radiology DG Chest Port 1 View  Result Date: 05/23/2021 CLINICAL DATA:  Shortness of breath, cough, COPD EXAM: PORTABLE CHEST 1 VIEW COMPARISON:  04/02/2021 FINDINGS: Cardiomegaly status post median sternotomy with aortic valve prosthesis and left chest multi lead pacer defibrillator. Both lungs are clear. The visualized skeletal structures are unremarkable. IMPRESSION: Cardiomegaly without acute abnormality of the lungs in AP portable projection. Electronically Signed   By: Eddie Candle M.D.   On: 05/23/2021 15:30    Procedures Procedures   Medications Ordered in ED Medications  albuterol (VENTOLIN HFA) 108 (90 Base) MCG/ACT inhaler 4 puff (has no administration in time range)  methylPREDNISolone sodium succinate (SOLU-MEDROL) 125 mg/2 mL injection 80 mg (80 mg Intravenous Given 05/23/21 1710)    ED Course  I have reviewed the triage vital signs and the nursing notes.  Pertinent labs & imaging results that were available during my care of the patient were reviewed by me and considered in my medical  decision making (see chart for details).    MDM Rules/Calculators/A&P                          Iv ns. Continuous pulse ox and cardiac monitoring. Stat labs and imaging.   Reviewed nursing notes and prior charts for additional history.   Albuterol treatment. Solumedrol iv.   Labs reviewed/interpreted by me - wbc normal. Additional labs pending.   Cxr reviewed/interpreted by me  - no pna or edema.   Additional labs reviewed/interpreted by me - cbc/chem c/w baseline. Bnp mildly high, c/w prior. Lasix dose iv.   Recheck no wheezing. Pt is breathing comfortably, feels at baseline. No chest pain or discomfort.   Pt requests d/c 'before it gets dark' - pt currently appears stable for d/c  Rec pcp/card f/u.  Return precautions provided.      Final Clinical Impression(s) / ED Diagnoses Final diagnoses:  SOB (shortness of breath)    Rx / DC Orders ED Discharge Orders     None        Lajean Saver, MD 05/23/21 1859

## 2021-05-23 NOTE — ED Provider Notes (Addendum)
Emergency Medicine Provider Triage Evaluation Note  [name] [name] , 87 y.o. female  was evaluated in triage.  Pt complains of SOB, coughing up white sputum. Patient has history of COPD. She denies chest pain, abdominal pain, fever.    Review of Systems  Positive: Cough, SOB Negative: Chest pain, fever  Physical Exam  BP (!) 187/80   Pulse 61   Temp (!) 97.5 F (36.4 C) (Oral)   Resp (!) 28   Ht 5\' 7"  (1.702 m)   Wt 87.5 kg   SpO2 (!) 83%   BMI 30.23 kg/m  Gen:   Awake, no distress  Resp:  On 6L nasal cannula satting 86-90%, not in respiratory distress, rhonci, crackles in Left lower lung field, all other lung fields clear MSK:   Moves extremities without difficulty  Other:  No abdominal tenderness  Medical Decision Making  Medically screening exam initiated at 2:31 PM.  Appropriate orders placed.  [name] [name] was informed that the remainder of the evaluation will be completed by another provider, this initial triage assessment does not replace that evaluation, and the importance of remaining in the ED until their evaluation is complete.  Cough, SOB   Anselmo Pickler, PA-C 05/23/21 1432    [name] [name] 05/23/21 1435    Noemi Chapel, MD 05/23/21 1453

## 2021-05-23 NOTE — Discharge Instructions (Addendum)
It was our pleasure to provide your ER care today - we hope that you feel better.  Continue your home meds. Limit salt intake, take one extra dose of your lasix tomorrow.   You also have mild wheezing - use albuterol treatments as need.   Follow up with primary care doctor in the next few days for recheck - call office tomorrow to check in and to arrange follow up visit.   Return to ER if worse, new symptoms, fevers, chest pain, increased trouble breathing, or other concern.

## 2021-05-24 ENCOUNTER — Telehealth: Payer: Self-pay | Admitting: *Deleted

## 2021-05-24 ENCOUNTER — Ambulatory Visit (INDEPENDENT_AMBULATORY_CARE_PROVIDER_SITE_OTHER): Payer: Medicare Other | Admitting: *Deleted

## 2021-05-24 DIAGNOSIS — Z5181 Encounter for therapeutic drug level monitoring: Secondary | ICD-10-CM | POA: Diagnosis not present

## 2021-05-24 DIAGNOSIS — E1122 Type 2 diabetes mellitus with diabetic chronic kidney disease: Secondary | ICD-10-CM | POA: Diagnosis not present

## 2021-05-24 DIAGNOSIS — I5043 Acute on chronic combined systolic (congestive) and diastolic (congestive) heart failure: Secondary | ICD-10-CM | POA: Diagnosis not present

## 2021-05-24 DIAGNOSIS — J449 Chronic obstructive pulmonary disease, unspecified: Secondary | ICD-10-CM | POA: Diagnosis not present

## 2021-05-24 DIAGNOSIS — I130 Hypertensive heart and chronic kidney disease with heart failure and stage 1 through stage 4 chronic kidney disease, or unspecified chronic kidney disease: Secondary | ICD-10-CM | POA: Diagnosis not present

## 2021-05-24 DIAGNOSIS — M1030 Gout due to renal impairment, unspecified site: Secondary | ICD-10-CM | POA: Diagnosis not present

## 2021-05-24 DIAGNOSIS — N184 Chronic kidney disease, stage 4 (severe): Secondary | ICD-10-CM | POA: Diagnosis not present

## 2021-05-24 DIAGNOSIS — Z952 Presence of prosthetic heart valve: Secondary | ICD-10-CM

## 2021-05-24 DIAGNOSIS — Z7901 Long term (current) use of anticoagulants: Secondary | ICD-10-CM

## 2021-05-24 LAB — POCT INR: INR: 2.2 (ref 2.0–3.0)

## 2021-05-24 NOTE — Telephone Encounter (Signed)
Spoke with [facility].  Gave coumain orders.  See coumadin note.

## 2021-05-24 NOTE — Telephone Encounter (Signed)
INR: 2.2 PT: 26.7

## 2021-05-24 NOTE — Patient Instructions (Signed)
Continue warfarin 1 tablet (10mg ) daily except for 1/2 tablet (5mg )  on Monday and Friday. Order given to [place] in 1 wk

## 2021-05-25 DIAGNOSIS — E1122 Type 2 diabetes mellitus with diabetic chronic kidney disease: Secondary | ICD-10-CM | POA: Diagnosis not present

## 2021-05-25 DIAGNOSIS — J449 Chronic obstructive pulmonary disease, unspecified: Secondary | ICD-10-CM | POA: Diagnosis not present

## 2021-05-25 DIAGNOSIS — I5042 Chronic combined systolic (congestive) and diastolic (congestive) heart failure: Secondary | ICD-10-CM | POA: Diagnosis not present

## 2021-05-25 DIAGNOSIS — I5043 Acute on chronic combined systolic (congestive) and diastolic (congestive) heart failure: Secondary | ICD-10-CM | POA: Diagnosis not present

## 2021-05-25 DIAGNOSIS — M1030 Gout due to renal impairment, unspecified site: Secondary | ICD-10-CM | POA: Diagnosis not present

## 2021-05-25 DIAGNOSIS — I130 Hypertensive heart and chronic kidney disease with heart failure and stage 1 through stage 4 chronic kidney disease, or unspecified chronic kidney disease: Secondary | ICD-10-CM | POA: Diagnosis not present

## 2021-05-25 DIAGNOSIS — Z950 Presence of cardiac pacemaker: Secondary | ICD-10-CM | POA: Diagnosis not present

## 2021-05-25 DIAGNOSIS — N184 Chronic kidney disease, stage 4 (severe): Secondary | ICD-10-CM | POA: Diagnosis not present

## 2021-05-25 NOTE — Progress Notes (Signed)
EPIC Encounter for ICM Monitoring  Patient Name: [name] [name] is a 87 y.o. female Date: 05/25/2021 Primary Care Physican: Fayrene Helper, MD Primary Cardiologist: [place] Electrophysiologist: Santina Evans Pacing:  99.8%  04/29/2021 Weight: 192 lbs           Spoke with patient and heart failure questions reviewed.  She reports ED visit for a little swelling and was given diuretic.  Explained if she has fluid symptoms that are not urgent during the week to call Dr Sallyanne Kuster or my number.   Optivol thoracic impedance suggesting normal fluid levels but was suggesting possible fluid accumulation on 8/27.      Prescribed:  Furosemide 40 mg 1 tablet (40 mg total) by mouth daily.   Potassium 20 mEq take 1 tablet daily. Per 04/14/21 nephrology note, pt is taking Potassium ever other day   Labs: 05/23/2021 Creatinine 1.63, BUN 52, Potassium 5.2, Sodium 138, GFR 30 04/12/2021 Creatinine 1.44, BUN 50, Potassium 5.3, Sodium 135, GFR 35 04/02/2021 Creatinine 1.57, BUN 60, Potassium 5.0, Sodium 138, GFR 32  02/07/2021 Creatinine 1.58, BUN 65, Potassium 4.6, Sodium 136, GFR 32 01/28/2021 Creatinine 1.72, BUN 89, Potassium 4.5, Sodium 136, GFR 29 01/27/2021 Creatinine 1.72, BUN 85, Potassium 5.1, Sodium 132, GFR 29 01/26/2021 Creatinine 1.63, BUN 87, Potassium 4.6, Sodium 134, GFR 31 01/25/2021 Creatinine 1.91, BUN 93, Potassium 4.4, Sodium 133, GFR 25  A complete set of results can be found in Results Review.   Recommendations:  No changes and encouraged to call if experiencing any fluid symptoms.   Follow-up plan: ICM clinic phone appointment on 07/04/2021.  91 day device clinic remote transmission 08/12/2021.    EP/Cardiology Office Visits:  06/06/2021 with Dr Sallyanne Kuster.     Copy of ICM check sent to Dr. Lovena Le.   3 month ICM trend: 05/22/2021.    1 Year ICM trend:       [name], RN 05/25/2021 9:56 AM

## 2021-05-27 ENCOUNTER — Other Ambulatory Visit (HOSPITAL_COMMUNITY): Payer: Medicare Other

## 2021-05-27 ENCOUNTER — Ambulatory Visit (HOSPITAL_COMMUNITY): Payer: Medicare Other

## 2021-05-27 ENCOUNTER — Ambulatory Visit (HOSPITAL_COMMUNITY): Payer: Medicare Other | Admitting: Physician Assistant

## 2021-05-27 DIAGNOSIS — E890 Postprocedural hypothyroidism: Secondary | ICD-10-CM | POA: Diagnosis not present

## 2021-05-27 DIAGNOSIS — I700 Atherosclerosis of aorta: Secondary | ICD-10-CM | POA: Diagnosis not present

## 2021-05-27 DIAGNOSIS — E11649 Type 2 diabetes mellitus with hypoglycemia without coma: Secondary | ICD-10-CM | POA: Diagnosis not present

## 2021-05-27 DIAGNOSIS — E1169 Type 2 diabetes mellitus with other specified complication: Secondary | ICD-10-CM | POA: Diagnosis not present

## 2021-05-27 DIAGNOSIS — E785 Hyperlipidemia, unspecified: Secondary | ICD-10-CM | POA: Diagnosis not present

## 2021-05-27 DIAGNOSIS — I130 Hypertensive heart and chronic kidney disease with heart failure and stage 1 through stage 4 chronic kidney disease, or unspecified chronic kidney disease: Secondary | ICD-10-CM | POA: Diagnosis not present

## 2021-05-27 DIAGNOSIS — N184 Chronic kidney disease, stage 4 (severe): Secondary | ICD-10-CM | POA: Diagnosis not present

## 2021-05-27 DIAGNOSIS — I5022 Chronic systolic (congestive) heart failure: Secondary | ICD-10-CM | POA: Diagnosis not present

## 2021-05-27 DIAGNOSIS — Z13220 Encounter for screening for lipoid disorders: Secondary | ICD-10-CM | POA: Diagnosis not present

## 2021-05-28 LAB — CMP14+EGFR
ALT: 15 IU/L (ref 0–32)
AST: 25 IU/L (ref 0–40)
Albumin/Globulin Ratio: 1.3 (ref 1.2–2.2)
Albumin: 4.1 g/dL (ref 3.6–4.6)
Alkaline Phosphatase: 78 IU/L (ref 44–121)
BUN/Creatinine Ratio: 36 — ABNORMAL HIGH (ref 12–28)
BUN: 51 mg/dL — ABNORMAL HIGH (ref 8–27)
Bilirubin Total: 0.3 mg/dL (ref 0.0–1.2)
CO2: 31 mmol/L — ABNORMAL HIGH (ref 20–29)
Calcium: 9.3 mg/dL (ref 8.7–10.3)
Chloride: 98 mmol/L (ref 96–106)
Creatinine, Ser: 1.43 mg/dL — ABNORMAL HIGH (ref 0.57–1.00)
Globulin, Total: 3.1 g/dL (ref 1.5–4.5)
Glucose: 80 mg/dL (ref 65–99)
Potassium: 4.8 mmol/L (ref 3.5–5.2)
Sodium: 140 mmol/L (ref 134–144)
Total Protein: 7.2 g/dL (ref 6.0–8.5)
eGFR: 35 mL/min/{1.73_m2} — ABNORMAL LOW (ref >59)

## 2021-05-28 LAB — LIPID PANEL
Chol/HDL Ratio: 2.5 ratio (ref 0.0–4.4)
Cholesterol, Total: 135 mg/dL (ref 100–199)
HDL: 54 mg/dL (ref >39)
LDL Chol Calc (NIH): 65 mg/dL (ref 0–99)
Triglycerides: 80 mg/dL (ref 0–149)
VLDL Cholesterol Cal: 16 mg/dL (ref 5–40)

## 2021-05-28 LAB — TSH: TSH: 4.4 u[IU]/mL (ref 0.450–4.500)

## 2021-05-28 NOTE — Progress Notes (Signed)
Remote pacemaker transmission.   

## 2021-05-29 DIAGNOSIS — Z20822 Contact with and (suspected) exposure to covid-19: Secondary | ICD-10-CM | POA: Diagnosis not present

## 2021-05-31 ENCOUNTER — Ambulatory Visit (INDEPENDENT_AMBULATORY_CARE_PROVIDER_SITE_OTHER): Payer: Medicare Other | Admitting: *Deleted

## 2021-05-31 ENCOUNTER — Telehealth: Payer: Self-pay | Admitting: Internal Medicine

## 2021-05-31 DIAGNOSIS — Z952 Presence of prosthetic heart valve: Secondary | ICD-10-CM

## 2021-05-31 DIAGNOSIS — J449 Chronic obstructive pulmonary disease, unspecified: Secondary | ICD-10-CM | POA: Diagnosis not present

## 2021-05-31 DIAGNOSIS — I5043 Acute on chronic combined systolic (congestive) and diastolic (congestive) heart failure: Secondary | ICD-10-CM | POA: Diagnosis not present

## 2021-05-31 DIAGNOSIS — Z7901 Long term (current) use of anticoagulants: Secondary | ICD-10-CM | POA: Diagnosis not present

## 2021-05-31 DIAGNOSIS — E1122 Type 2 diabetes mellitus with diabetic chronic kidney disease: Secondary | ICD-10-CM | POA: Diagnosis not present

## 2021-05-31 DIAGNOSIS — Z5181 Encounter for therapeutic drug level monitoring: Secondary | ICD-10-CM | POA: Diagnosis not present

## 2021-05-31 DIAGNOSIS — I130 Hypertensive heart and chronic kidney disease with heart failure and stage 1 through stage 4 chronic kidney disease, or unspecified chronic kidney disease: Secondary | ICD-10-CM | POA: Diagnosis not present

## 2021-05-31 DIAGNOSIS — N184 Chronic kidney disease, stage 4 (severe): Secondary | ICD-10-CM | POA: Diagnosis not present

## 2021-05-31 DIAGNOSIS — M1030 Gout due to renal impairment, unspecified site: Secondary | ICD-10-CM | POA: Diagnosis not present

## 2021-05-31 LAB — POCT INR: INR: 2.1 (ref 2.0–3.0)

## 2021-05-31 NOTE — Telephone Encounter (Signed)
Spoke with [facility].  Coumadin orders given.  See coumadin note.

## 2021-05-31 NOTE — Patient Instructions (Signed)
Continue warfarin 1 tablet (10mg ) daily except for 1/2 tablet (5mg )  on Monday and Friday. Order given to [place] in 1 wk

## 2021-05-31 NOTE — Telephone Encounter (Signed)
PTINR RESULTS   INR 2.1  PT 25.3

## 2021-06-01 ENCOUNTER — Inpatient Hospital Stay (HOSPITAL_COMMUNITY): Payer: Medicare Other

## 2021-06-01 ENCOUNTER — Other Ambulatory Visit (HOSPITAL_COMMUNITY)
Admission: RE | Admit: 2021-06-01 | Discharge: 2021-06-01 | Disposition: A | Discharge status: Home / Self Care | Payer: Medicare Other | Source: Ambulatory Visit | Attending: Nephrology | Admitting: Nephrology

## 2021-06-01 ENCOUNTER — Telehealth: Payer: Self-pay | Admitting: Nurse Practitioner

## 2021-06-01 ENCOUNTER — Ambulatory Visit (INDEPENDENT_AMBULATORY_CARE_PROVIDER_SITE_OTHER): Payer: Medicare Other

## 2021-06-01 ENCOUNTER — Inpatient Hospital Stay (HOSPITAL_COMMUNITY): Payer: Medicare Other | Attending: Physician Assistant

## 2021-06-01 ENCOUNTER — Other Ambulatory Visit: Payer: Self-pay

## 2021-06-01 VITALS — BP 157/64 | HR 60 | Resp 17

## 2021-06-01 DIAGNOSIS — N1832 Chronic kidney disease, stage 3b: Secondary | ICD-10-CM | POA: Diagnosis not present

## 2021-06-01 DIAGNOSIS — Z0000 Encounter for general adult medical examination without abnormal findings: Secondary | ICD-10-CM

## 2021-06-01 DIAGNOSIS — E875 Hyperkalemia: Secondary | ICD-10-CM | POA: Insufficient documentation

## 2021-06-01 DIAGNOSIS — I129 Hypertensive chronic kidney disease with stage 1 through stage 4 chronic kidney disease, or unspecified chronic kidney disease: Secondary | ICD-10-CM | POA: Insufficient documentation

## 2021-06-01 DIAGNOSIS — D649 Anemia, unspecified: Secondary | ICD-10-CM

## 2021-06-01 DIAGNOSIS — D508 Other iron deficiency anemias: Secondary | ICD-10-CM | POA: Diagnosis not present

## 2021-06-01 DIAGNOSIS — N184 Chronic kidney disease, stage 4 (severe): Secondary | ICD-10-CM

## 2021-06-01 DIAGNOSIS — D631 Anemia in chronic kidney disease: Secondary | ICD-10-CM | POA: Diagnosis not present

## 2021-06-01 DIAGNOSIS — N189 Chronic kidney disease, unspecified: Secondary | ICD-10-CM | POA: Insufficient documentation

## 2021-06-01 LAB — CBC WITH DIFFERENTIAL/PLATELET
Abs Immature Granulocytes: 0.02 10*3/uL (ref 0.00–0.07)
Basophils Absolute: 0 10*3/uL (ref 0.0–0.1)
Basophils Relative: 0 %
Eosinophils Absolute: 0 10*3/uL (ref 0.0–0.5)
Eosinophils Relative: 0 %
HCT: 35.2 % — ABNORMAL LOW (ref 36.0–46.0)
Hemoglobin: 10.6 g/dL — ABNORMAL LOW (ref 12.0–15.0)
Immature Granulocytes: 0 %
Lymphocytes Relative: 16 %
Lymphs Abs: 0.9 10*3/uL (ref 0.7–4.0)
MCH: 28.5 pg (ref 26.0–34.0)
MCHC: 30.1 g/dL (ref 30.0–36.0)
MCV: 94.6 fL (ref 80.0–100.0)
Monocytes Absolute: 0.5 10*3/uL (ref 0.1–1.0)
Monocytes Relative: 8 %
Neutro Abs: 4.1 10*3/uL (ref 1.7–7.7)
Neutrophils Relative %: 76 %
Platelets: 153 10*3/uL (ref 150–400)
RBC: 3.72 MIL/uL — ABNORMAL LOW (ref 3.87–5.11)
RDW: 18.9 % — ABNORMAL HIGH (ref 11.5–15.5)
WBC: 5.5 10*3/uL (ref 4.0–10.5)
nRBC: 0 % (ref 0.0–0.2)

## 2021-06-01 LAB — RENAL FUNCTION PANEL
Albumin: 3.8 g/dL (ref 3.5–5.0)
Anion gap: 7 (ref 5–15)
BUN: 59 mg/dL — ABNORMAL HIGH (ref 8–23)
CO2: 30 mmol/L (ref 22–32)
Calcium: 9.3 mg/dL (ref 8.9–10.3)
Chloride: 104 mmol/L (ref 98–111)
Creatinine, Ser: 1.55 mg/dL — ABNORMAL HIGH (ref 0.44–1.00)
GFR, Estimated: 32 mL/min — ABNORMAL LOW (ref >60)
Glucose, Bld: 89 mg/dL (ref 70–99)
Phosphorus: 3.1 mg/dL (ref 2.5–4.6)
Potassium: 5 mmol/L (ref 3.5–5.1)
Sodium: 141 mmol/L (ref 135–145)

## 2021-06-01 MED ORDER — EPOETIN ALFA-EPBX 40000 UNIT/ML IJ SOLN: 40000.0000 [IU] | Freq: Once | INTRAMUSCULAR | Status: DC

## 2021-06-01 MED ORDER — EPOETIN ALFA-EPBX 20000 UNIT/ML IJ SOLN
40000.0000 [IU] | Freq: Once | INTRAMUSCULAR | Status: AC
  Administered 2021-06-01: 40000 [IU] via SUBCUTANEOUS
  Filled 2021-06-01: qty 2

## 2021-06-01 NOTE — Progress Notes (Signed)
Subjective:   [name] [name] is a 87 y.o. female who presents for Medicare Annual (Subsequent) preventive examination.I connected with  [name] [name] on 06/01/21 by a audio enabled telemedicine application and verified that I am speaking with the correct person using two identifiers.   I discussed the limitations of evaluation and management by telemedicine. The patient expressed understanding and agreed to proceed.   Location of patient:Home   Location of Provider:Office  Persons participating in virtual visit: [name] [name] (patient) and Edgar Frisk, CMA  Review of Systems    Defer to PCP       Objective:    Today's Vitals   06/01/21 1521  PainSc: 0-No pain   There is no height or weight on file to calculate BMI.  Advanced Directives 06/01/2021 06/01/2021 05/03/2021 04/12/2021 03/21/2021 02/17/2021 02/15/2021  Does Patient Have a Medical Advance Directive? No No No No No No No  Type of Advance Directive - - - - - - -  Does patient want to make changes to medical advance directive? - - - No - Patient declined No - Patient declined No - Patient declined No - Patient declined  Would patient like information on creating a medical advance directive? - No - Patient declined No - Patient declined No - Patient declined No - Patient declined - No - Patient declined  Pre-existing out of facility DNR order (yellow form or pink MOST form) - Pink MOST form placed in chart (order not valid for inpatient use) Pink MOST form placed in chart (order not valid for inpatient use) - - - -    Current Medications (verified) Outpatient Encounter Medications as of 06/01/2021  Medication Sig   acetaminophen (TYLENOL) 500 MG tablet Take 500 mg by mouth every 6 (six) hours as needed.   albuterol (PROVENTIL) (2.5 MG/3ML) 0.083% nebulizer solution Take 3 mLs (2.5 mg total) by nebulization in the morning and at bedtime.   albuterol (VENTOLIN HFA) 108 (90 Base) MCG/ACT inhaler Inhale 2 puffs into the lungs every 4 (four)  hours as needed for wheezing or shortness of breath.   allopurinol (ZYLOPRIM) 100 MG tablet Take 1 tablet (100 mg total) by mouth daily.   Amino Acids-Protein Hydrolys (FEEDING SUPPLEMENT, PRO-STAT SUGAR FREE 64,) LIQD Take 30 mLs by mouth 2 (two) times daily between meals.   Balsam [place]-Castor Oil (VENELEX) OINT Apply topically. topical, Every Shift, Apply to bilateral buttocks and sacrum qshift.   cholecalciferol (VITAMIN D3) 25 MCG (1000 UNIT) tablet Take 1 tablet (1,000 Units total) by mouth in the morning.   fluticasone (FLONASE) 50 MCG/ACT nasal spray Place 2 sprays into both nostrils daily.   fluticasone-salmeterol (ADVAIR) 250-50 MCG/ACT AEPB Inhale 1 puff into the lungs in the morning and at bedtime.   folic acid (FOLVITE) 1 MG tablet TAKE ONE TABLET BY MOUTH ONCE DAILY.   furosemide (LASIX) 40 MG tablet TAKE (1) TABLET BY MOUTH DAILY.   gabapentin (NEURONTIN) 300 MG capsule TAKE (1) CAPSULE BY MOUTH TWICE DAILY   hydrALAZINE (APRESOLINE) 10 MG tablet TAKE (1) TABLET BY MOUTH EVERY EIGHT HOURS.   isosorbide mononitrate (IMDUR) 30 MG 24 hr tablet TAKE 1/2 TABLET BY MOUTH ONCE DAILY.   metoprolol succinate (TOPROL-XL) 50 MG 24 hr tablet Take 1 tablet (50 mg total) by mouth daily. Take with or immediately following a meal.   nitroGLYCERIN (NITROSTAT) 0.4 MG SL tablet DISSOLVE 1 TABLET SUBLINGUALLY AS NEEDED FOR CHEST PAIN, MAY REPEAT EVERY 5 MINUTES. AFTER 3 CALL 911.   NON FORMULARY  Diet :mechanical soft diet with ground meats, NAS, cons CHO   ondansetron (ZOFRAN-ODT) 4 MG disintegrating tablet TAKE (1) TABLET BY MOUTH EVERY EIGHT HOURS AS NEEDED FOR NAUSEA OR VOMITING   OXYGEN Inhale 3 L into the lungs continuous.   polyethylene glycol (MIRALAX / GLYCOLAX) 17 g packet Take 17 g by mouth daily as needed.   potassium chloride SA (KLOR-CON) 20 MEQ tablet Take 1 tablet (20 mEq total) by mouth daily. (Patient taking differently: Take 20 mEq by mouth daily. 04/21/21- takes one QOD per pt- per  nephrologist order)   pravastatin (PRAVACHOL) 40 MG tablet TAKE ONE TABLET BY MOUTH ONCE DAILY.   SYNTHROID 88 MCG tablet TAKE 1 TABLET BEFORE BREAKFAST.   UNABLE TO FIND Compression Stockings R-  Ankle  26.5cm   Calf 38cm  Leg  44cm L-  Ankle  27.5 cm   Calf 39cm  Leg  44cm (Patient taking differently: Compression Stockings R-  Ankle  26.5cm   Calf 38cm  Leg  44cm L-  Ankle  27.5 cm   Calf 39cm  Leg  44cm)   warfarin (COUMADIN) 10 MG tablet Take warfarin 15mg  tonight then continue 10mg  daily except 5mg  on Mondays, Wednesdays and Fridays or as directed   benzonatate (TESSALON) 100 MG capsule Take 1 capsule (100 mg total) by mouth 2 (two) times daily as needed for cough. (Patient not taking: Reported on 06/01/2021)   levETIRAcetam (KEPPRA) 500 MG tablet TAKE (1) TABLET BY MOUTH TWICE DAILY AT 9AM AND 9PM (Patient not taking: Reported on 06/01/2021)   predniSONE (DELTASONE) 20 MG tablet Take 2 tablets (40 mg total) by mouth daily. (Patient not taking: Reported on 06/01/2021)   [EXPIRED] epoetin alfa-epbx (RETACRIT) injection 40,000 Units    [DISCONTINUED] epoetin alfa-epbx (RETACRIT) injection 40,000 Units    No facility-administered encounter medications on file as of 06/01/2021.    Allergies (verified) Penicillins, Aspirin, Fentanyl, and Niacin   History: Past Medical History:  Diagnosis Date   Acute on chronic diastolic CHF (congestive heart failure) ([place]) 05/19/2012   Acute on chronic respiratory failure with hypoxia ([place]) 01/31/2020   Adenomatous polyp of colon 12/16/2002   Dr. [name]/[facility]   Allergy    Calculus of gallbladder with chronic cholecystitis without obstruction 02/09/2009   Qualifier: Diagnosis of  By: [name], Margaret     Cataract    CHB (complete heart block) ([place]) 10/07/2014       CHF (congestive heart failure) ([place])    a.  EF previously reduced to 25-30% b. EF improved to 60-65% by echo in 10/2017.    Chronic gout due to renal  impairment    Per [facility], [facility] Electronic [facility] Records [facility]    Chronic kidney disease, stage I 2006   Cognitive communication deficit    Per [facility], [facility] Records [facility]    Constipation        Convulsion ([place])    Per [facility], [facility] Records [facility]    COPD (chronic obstructive pulmonary disease) ([place])        DJD (degenerative joint disease) of lumbar spine    DM (diabetes mellitus) ([place]) 2006   Dysphagia, unspecified    Per [facility], [facility] Electronic [facility] Records [facility]    Esophageal reflux    Glaucoma    Gout        H/O adenomatous polyp of colon 05/24/2012   2004 in [place] colonoscopy  2008 normal     H/O heart valve replacement with mechanical valve    a. s/p mechanical AVR in 2001 and mechanical MVR in 2004.   H/O wisdom tooth extraction    HIP PAIN, RIGHT 04/23/2009   Qualifier: Diagnosis of  By: [name], Margaret     Hyperkalemia    Per Zadie Cleverly, [facility] Electronic [facility] Records [facility]    Hyperlipemia    Hypertensive heart and kidney disease with HF and with CKD stage I-IV ([place])    Per [facility], [facility] Records [facility]    IDA (iron deficiency anemia)    Parenteral iron/[name]   Insomnia        Localized edema    Left arm, Per [facility], [facility] Records [facility]    Long term (current) use of anticoagulants    Per [facility], [facility] Records [facility]    Nausea without vomiting 06/07/2010   Qualifier: Diagnosis of  By: [name], Margaret     Non-ischemic cardiomyopathy [facility])    a. s/p MDT CRTD   Obesity, unspecified    Oxygen deficiency 2014   nocturnal;   Papilloma of breast 03/06/2012   This was diagnosed as a left breast papilloma by ultrasound characteristics and symptoms in 2010. Because of possible medical risk factors no surgical  intervention has been done and it did doing annual followups. The area has been stable by physical examination and mammograms and ultrasound since 2010.    Presence of cardiac pacemaker    Per [facility], [facility] Records [facility]    Spinal stenosis of lumbar region 02/19/2013   Spondylolisthesis    Spondylosis    Type 2 diabetes mellitus with hyperglycemia ([place]) 07/15/2010   Type 2 diabetes mellitus with hypoglycemia without coma ([place])    Per [facility], [facility] Records [facility]    Unspecified chronic bronchitis ([place])    Per [facility], [facility] Records [facility]    Unspecified hypothyroidism        Urinary tract infection, site not specified    Per [facility], [facility] Records [facility]    Varicose veins of lower extremities with other complications    Vertigo        Past Surgical History:  Procedure Laterality Date   AORTIC AND MITRAL VALVE REPLACEMENT  2001   BI-VENTRICULAR IMPLANTABLE CARDIOVERTER DEFIBRILLATOR UPGRADE N/A 01/18/2015   Procedure: BI-VENTRICULAR IMPLANTABLE CARDIOVERTER DEFIBRILLATOR UPGRADE;  Surgeon: Evans Lance, MD;  Location: [facility] CATH LAB;  Service: Cardiovascular;  Laterality: N/A;   BIV ICD GENERATOR CHANGEOUT N/A 01/30/2019   Procedure: BIV ICD GENERATOR CHANGEOUT;  Surgeon: Evans Lance, MD;  Location: [place] [facility] CV LAB;  Service: Cardiovascular;  Laterality: N/A;   CARDIAC CATHETERIZATION     CARDIAC VALVE REPLACEMENT     CARDIOVERSION N/A 11/13/2016   Procedure: CARDIOVERSION;  Surgeon: Sanda Klein, MD;  Location: [facility] ENDOSCOPY;  Service: Cardiovascular;  Laterality: N/A;   COLONOSCOPY  06/12/2007   Rourk- Normal rectum/Normal colon   COLONOSCOPY  12/16/02   small hemorrhoids   COLONOSCOPY  06/20/2012   Procedure: COLONOSCOPY;  Surgeon: Daneil Dolin, MD;  [facility];  Service: Endoscopy;  Laterality: N/A;  10:45   defibrillator  implanted 2006     DOPPLER ECHOCARDIOGRAPHY  2012   DOPPLER ECHOCARDIOGRAPHY  05,06,07,08,09,2011   ESOPHAGOGASTRODUODENOSCOPY  06/12/2007   Rourk- normal esophagus, small hiatal hernia,  otherwise normal stomach, D1, D2   EYE SURGERY Right 2005   EYE SURGERY Left 2006   ICD LEAD REMOVAL Left 02/08/2015   Procedure: ICD LEAD REMOVAL;  Surgeon: Evans Lance, MD;  Location: [place];  Service: Cardiovascular;  Laterality: Left;  "Will plan extraction and insertion of a BiV PM"  **Dr. Roxan Hockey backing up case**   IMPLANTABLE CARDIOVERTER DEFIBRILLATOR (ICD) GENERATOR CHANGE Left 02/08/2015   Procedure: ICD GENERATOR CHANGE;  Surgeon: Evans Lance, MD;  Location: [place];  Service: Cardiovascular;  Laterality: Left;   INSERT / REPLACE / REMOVE PACEMAKER     PACEMAKER INSERTION  May 2016   pacemaker placed  2004   right breast cyst removed     benign    right cataract removed     2005   TEE WITHOUT CARDIOVERSION N/A 11/13/2016   Procedure: TRANSESOPHAGEAL ECHOCARDIOGRAM (TEE);  Surgeon: Sanda Klein, MD;  Location: [facility] ENDOSCOPY;  Service: Cardiovascular;  Laterality: N/A;   THYROIDECTOMY     Family History  Problem Relation Age of Onset   Pancreatic cancer Mother    Heart disease Father    Heart disease Sister    Heart attack Sister    Heart disease Brother    Diabetes Brother    Heart disease Brother    Diabetes Brother    Hypertension Brother    Lung cancer Brother    Social History   Socioeconomic History   Marital status: Widowed    Spouse name: Not on file   Number of children: 0   Years of education: Not on file   Highest education level: Not on file  Occupational History   Occupation: retired    Fish farm manager: RETIRED  Tobacco Use   Smoking status: Former    Packs/day: 0.75    Years: 40.00    Pack years: 30.00    Types: Cigarettes    Quit date: 03/08/1987    Years since quitting: 34.2   Smokeless tobacco: Never  Vaping Use   Vaping Use: Never used  Substance and  Sexual Activity   Alcohol use: No    Alcohol/week: 0.0 standard drinks   Drug use: No   Sexual activity: Not Currently  Other Topics Concern   Not on file  Social History Narrative   From [place] (2004)   Social Determinants of Health   Financial Resource Strain: Low Risk    Difficulty of Paying Living Expenses: Not hard at all  Food Insecurity: No Food Insecurity   Worried About Charity fundraiser in the Last Year: Never true   Arboriculturist in the Last Year: Never true  Transportation Needs: No Transportation Needs   Lack of Transportation (Medical): No   Lack of Transportation (Non-Medical): No  Physical Activity: Inactive   Days of Exercise per Week: 0 days   Minutes of Exercise per Session: 0 min  Stress: No Stress Concern Present   Feeling of Stress : Only a little  Social Connections: Moderately Isolated   Frequency of Communication with Friends and Family: More than three times a week   Frequency of Social Gatherings with Friends and Family: Twice a week   Attends Religious Services: 1 to 4 times per year   Active Member of [employer] or Organizations: No   Attends Archivist Meetings: Never   Marital Status: Widowed    Tobacco Counseling Counseling given: Not Answered   Clinical Intake:  Pre-visit preparation completed: No  Pain : No/denies pain  Pain Score: 0-No pain     Diabetes: Yes  How often do you need to have someone help you when you read instructions, pamphlets, or other written materials from your doctor or pharmacy?: 4 - Often What is the last grade level you completed in school?: High School  Diabetic?yes Nutrition Risk Assessment:  Has the patient had any N/V/D within the last 2 months?  No  Does the patient have any non-healing wounds?  No  Has the patient had any unintentional weight loss or weight gain?  No   Diabetes:  Is the patient diabetic?  Yes  If diabetic, was a CBG obtained today?  No  Did the patient bring in  their glucometer from home?  No  How often do you monitor your CBG's? N/A.   Financial Strains and Diabetes Management:  Are you having any financial strains with the device, your supplies or your medication? No .  Does the patient want to be seen by Chronic Care Management for management of their diabetes?  No  Would the patient like to be referred to a Nutritionist or for Diabetic Management?  No   Diabetic Exams:  Diabetic Eye Exam: Overdue for diabetic eye exam. Pt has been advised about the importance in completing this exam. Patient advised to call and schedule an eye exam. Diabetic Foot Exam: Overdue, Pt has been advised about the importance in completing this exam. Pt is scheduled for diabetic foot exam on 06/03/2021.   Interpreter Needed?: No  Information entered by :: Talin Rozeboom J,CMA   Activities of Daily Living In your present state of health, do you have any difficulty performing the following activities: 06/01/2021 01/25/2021  Hearing? N N  Comment - -  Vision? N N  Comment - -  Difficulty concentrating or making decisions? N -  Comment - -  Walking or climbing stairs? Y -  Dressing or bathing? Y -  Doing errands, shopping? Y -  Conservation officer, nature and eating ? Y -  Using the Toilet? N -  In the past six months, have you accidently leaked urine? N -  Do you have problems with loss of bowel control? N -  Managing your Medications? Y -  Managing your Finances? Y -  Housekeeping or managing your Housekeeping? Y -  Some recent data might be hidden    Patient Care Team: Fayrene Helper, MD as PCP - General (Family Medicine) Croitoru, Dani Gobble, MD as PCP - Cardiology (Cardiology) Evans Lance, MD as PCP - Electrophysiology (Cardiology) Evans Lance, MD as Consulting Physician (Cardiology) Sheldon Silvan Manon Hilding, PA-C as Physician Assistant (Oncology) Madelin Headings, DO (Optometry) Fran Lowes, MD (Inactive) as Consulting Physician (Nephrology) Nickel, Sharmon Leyden, NP  (Inactive) as Nurse Practitioner (Vascular Surgery) Sanjuana Kava, MD as Consulting Physician (Orthopedic Surgery) Herminio Commons, MD (Inactive) as Attending Physician (Cardiology) Early, Arvilla Meres, MD as Consulting Physician (Vascular Surgery) Clista Bernhardt, MD as Consulting Physician [facility] Ophthalmology) Sanda Klein, MD as Consulting Physician (Cardiology) System, Provider Not In Kassie Mends, RN as [place] any recent [place] you may have received from other than [facility] providers in the past year (date may be approximate).     Assessment:   This is a routine wellness examination for [name] [name].  Hearing/Vision screen No results found.  Dietary issues and exercise activities discussed: Current Exercise Habits: The patient does not participate in regular exercise at present   Goals Addressed   None  Depression Screen PHQ 2/9 Scores 06/01/2021 12/14/2020 11/11/2020 09/22/2020 08/24/2020 08/16/2020 05/10/2020  PHQ - 2 Score 0 0 1 0 0 0 0  PHQ- 9 Score - - - - - - -    Fall Risk Fall Risk  06/01/2021 04/04/2021 12/14/2020 11/11/2020 09/22/2020  Falls in the past year? 0 1 0 0 1  Number falls in past yr: 0 0 0 0 0  Injury with Fall? 0 0 0 0 0  Risk for fall due to : Impaired balance/gait;Impaired mobility - No Fall Risks No Fall Risks Impaired mobility  Follow up Falls evaluation completed - Falls evaluation completed Falls evaluation completed Falls evaluation completed    FALL RISK PREVENTION PERTAINING TO THE HOME:  Any stairs in or around the home? No  If so, are there any without handrails? No  Home free of loose throw rugs in walkways, pet beds, electrical cords, etc? Yes  Adequate lighting in your home to reduce risk of falls? Yes   ASSISTIVE DEVICES UTILIZED TO PREVENT FALLS:  Life alert? Yes  Use of a cane, walker or w/c? Yes  Grab bars in the bathroom? Yes  Shower chair or bench in shower? Yes   Elevated toilet seat or a handicapped toilet? No   TIMED UP AND GO:  Was the test performed?  N/A .  Length of time to ambulate 10 feet: N/A sec.     Cognitive Function:     6CIT Screen 06/01/2021 05/10/2020 02/05/2019 02/01/2018 03/12/2017  What Year? - 0 points 0 points 0 points 0 points  What month? - 0 points 0 points 0 points 0 points  What time? 0 points 0 points 0 points 0 points 0 points  Count back from 20 4 points 0 points 0 points 0 points 0 points  Months in reverse 4 points 0 points 0 points 2 points 0 points  Repeat phrase 10 points 0 points 0 points 2 points 0 points  Total Score - 0 0 4 0    Immunizations Immunization History  Administered Date(s) Administered   Fluad Quad(high Dose 65+) 05/19/2019, 06/16/2020   Influenza Split 06/19/2011, 07/17/2012   Influenza Whole 06/20/2007, 06/22/2008, 06/24/2009, 05/27/2010   Influenza, High Dose Seasonal PF 07/17/2018   Influenza,inj,Quad PF,6+ Mos 07/15/2013, 06/03/2014, 06/21/2015, 07/06/2016, 11/17/2016, 06/29/2017   Moderna SARS-COV2 Booster Vaccination 12/09/2020, 03/01/2021   Moderna Sars-Covid-2 Vaccination 12/04/2019, 01/06/2020   Pneumococcal Conjugate-13 10/13/2014   Pneumococcal Polysaccharide-23 06/10/2008, 05/31/2019   Td 08/31/2004   Tdap 02/17/2015, 11/17/2019    TDAP status: Up to date  Flu Vaccine status: Due, Education has been provided regarding the importance of this vaccine. Advised may receive this vaccine at local pharmacy or Health Dept. Aware to provide a copy of the vaccination record if obtained from local pharmacy or Health Dept. Verbalized acceptance and understanding.  Pneumococcal vaccine status: Up to date  Covid-19 vaccine status: Completed vaccines  Qualifies for Shingles Vaccine? Yes   Zostavax completed No   Shingrix Completed?: No.    Education has been provided regarding the importance of this vaccine. Patient has been advised to call insurance company to determine out of pocket  expense if they have not yet received this vaccine. Advised may also receive vaccine at local pharmacy or Health Dept. Verbalized acceptance and understanding.  Screening Tests Health Maintenance  Topic Date Due   Zoster Vaccines- Shingrix (1 of 2) Never done   FOOT EXAM  07/18/2019   HEMOGLOBIN A1C  09/22/2020   OPHTHALMOLOGY EXAM  12/30/2020   INFLUENZA VACCINE  06/25/2021 (Originally 04/25/2021)   COVID-19 Vaccine (5 - Booster for Moderna series) 07/01/2021   TETANUS/TDAP  11/16/2029   DEXA SCAN  Completed   PNA vac Low Risk Adult  Completed   HPV VACCINES  Aged Out    Health Maintenance  Health Maintenance Due  Topic Date Due   Zoster Vaccines- Shingrix (1 of 2) Never done   FOOT EXAM  07/18/2019   HEMOGLOBIN A1C  09/22/2020   OPHTHALMOLOGY EXAM  12/30/2020    Colorectal cancer screening: No longer required.   Mammogram status: No longer required due to age.  Bone Density status: Completed 02/10/2014. Results reflect: Bone density results: OSTEOPENIA. Repeat every 3 years.  Lung Cancer Screening: (Low Dose CT Chest recommended if Age 43-80 years, 30 pack-year currently smoking OR have quit w/in 15years.) does not qualify.   Lung Cancer Screening Referral: no  Additional Screening:  Hepatitis C Screening: does not qualify; Completed Once  Vision Screening: Recommended annual ophthalmology exams for early detection of glaucoma and other disorders of the eye. Is the patient up to date with their annual eye exam?  No  Who is the provider or what is the name of the office in which the patient attends annual eye exams? [facility] If pt is not established with a provider, would they like to be referred to a provider to establish care? No .   Dental Screening: Recommended annual dental exams for proper oral hygiene  Community Resource Referral / Chronic Care Management: CRR required this visit?  No   CCM required this visit?  No      Plan:     I have personally  reviewed and noted the following in the patient's chart:   Medical and social history Use of alcohol, tobacco or illicit drugs  Current medications and supplements including opioid prescriptions.  Functional ability and status Nutritional status Physical activity Advanced directives List of other physicians Hospitalizations, surgeries, and ER visits in previous 12 months Vitals Screenings to include cognitive, depression, and falls Referrals and appointments  In addition, I have reviewed and discussed with patient certain preventive protocols, quality metrics, and best practice recommendations. A written personalized care plan for preventive services as well as general preventive health recommendations were provided to patient.     Edgar Frisk, [place]   06/01/2021   Nurse Notes: Non Face to Face 30 minute Visit Encounter   Ms. Steedman , Thank you for taking time to come for your Medicare Wellness Visit. I appreciate your ongoing commitment to your health goals. Please review the following plan we discussed and let me know if I can assist you in the future.   These are the goals we discussed:  Goals      Track and Manage My Blood Pressure-Hypertension     Timeframe:  Long-Range Goal Priority:  Medium Start Date:      10/29/2020                       Expected End Date:  11/19/2021  Follow up- 06/16/21                 Continue checking blood pressure at least 3 times per week with the assistance of in home aide, record in log or diary Call primary care doctor for any readings outside of normal limits systolic >785, diastolic >88 Continue to follow a low salt diet- avoid fast food, salty chips, fries,  etc. Continue to follow up with your doctor - Annual Wellness Visit 9/7 and primary care doctor 9/15, labwork and retacrit injection 9/7 Take all medications as prescribed and keep timely refills Try to do some type of light exercise daily even if for a few minutes (walking is  good) Continue working with [place] and physical therapist- do prescribed exercises   Why is this important?   You won't feel high blood pressure, but it can still hurt your blood vessels.  High blood pressure can cause heart or kidney problems. It can also cause a stroke.  Making lifestyle changes like losing a little weight or eating less salt will help.  Checking your blood pressure at home and at different times of the day can help to control blood pressure.  If the doctor prescribes medicine remember to take it the way the doctor ordered.  Call the office if you cannot afford the medicine or if there are questions about it.         Track and Manage Symptoms-Heart Failure     Timeframe:  Long-Range Goal Priority:  High Start Date:       10/29/2020                      Expected End Date:   11/19/2021                    Follow Up Date 06/16/2021   Continue to be mindful of how you are feeling each day and continue to follow heart failure action plan if symptoms flare up Continue weighing and recording, ask CNA to help you know when to call the doctor- call for weight gain of 3 pounds in one day or 5 pounds in one week. Call RN Care Manager for any questions at [phone] It is important to wear compression hose as directed every day , take them off at night Continue taking medications as prescribed and call for refills before running out You have follow up appointment with Dr. [name] on 9/15, Annual Wellness Visit on 9/7 , bloodwork on 9/7 and injection Use [place] Jelly if oxygen tubing is making your nose sore, do not use petroleum based products, be careful of oxygen tubing so you do not fall Alternate activity with rest and take your time Continue working with [place] and physical therapist as well as Palliative Care ([facility])   Why is this important?   You will be able to handle your symptoms better if you keep track of them.  Making some simple  changes to your lifestyle will help.  Eating healthy is one thing you can do to take good care of yourself.            This is a list of the screening recommended for you and due dates:  Health Maintenance  Topic Date Due   Zoster (Shingles) Vaccine (1 of 2) Never done   Complete foot exam   07/18/2019   Hemoglobin A1C  09/22/2020   Eye exam for diabetics  12/30/2020   Flu Shot  06/25/2021*   COVID-19 Vaccine (5 - Booster for Moderna series) 07/01/2021   Tetanus Vaccine  11/16/2029   DEXA scan (bone density measurement)  Completed   Pneumonia vaccines  Completed   HPV Vaccine  Aged Out  *Topic was postponed. The date shown is not the original due date.

## 2021-06-01 NOTE — Telephone Encounter (Signed)
I attempted to contact [name] [name] to schedule f/u pc visit, no answer, unable to leave a message

## 2021-06-01 NOTE — Patient Instructions (Signed)
[facility]  Discharge Instructions: Thank you for choosing [facility] to provide your oncology and hematology care.  If you have a lab appointment with the Cancer Center, please come in thru the Main Entrance and check in at the main information desk.  Wear comfortable clothing and clothing appropriate for easy access to any Portacath or PICC line.   We strive to give you quality time with your provider. You may need to reschedule your appointment if you arrive late (15 or more minutes).  Arriving late affects you and other patients whose appointments are after yours.  Also, if you miss three or more appointments without notifying the office, you may be dismissed from the clinic at the provider's discretion.      For prescription refill requests, have your pharmacy contact our office and allow 72 hours for refills to be completed.    Today you received the following chemotherapy and/or immunotherapy agents Retacrit      To help prevent nausea and vomiting after your treatment, we encourage you to take your nausea medication as directed.  BELOW ARE SYMPTOMS THAT SHOULD BE REPORTED IMMEDIATELY: *FEVER GREATER THAN 100.4 F (38 C) OR HIGHER *CHILLS OR SWEATING *NAUSEA AND VOMITING THAT IS NOT CONTROLLED WITH YOUR NAUSEA MEDICATION *UNUSUAL SHORTNESS OF BREATH *UNUSUAL BRUISING OR BLEEDING *URINARY PROBLEMS (pain or burning when urinating, or frequent urination) *BOWEL PROBLEMS (unusual diarrhea, constipation, pain near the anus) TENDERNESS IN MOUTH AND THROAT WITH OR WITHOUT PRESENCE OF ULCERS (sore throat, sores in mouth, or a toothache) UNUSUAL RASH, SWELLING OR PAIN  UNUSUAL VAGINAL DISCHARGE OR ITCHING   Items with * indicate a potential emergency and should be followed up as soon as possible or go to the Emergency Department if any problems should occur.  Please show the CHEMOTHERAPY ALERT CARD or IMMUNOTHERAPY ALERT CARD at check-in to the Emergency  Department and triage nurse.  Should you have questions after your visit or need to cancel or reschedule your appointment, please contact [facility] [phone]  and follow the prompts.  Office hours are 8:00 a.m. to 4:30 p.m. Monday - Friday. Please note that voicemails left after 4:00 p.m. may not be returned until the following business day.  We are closed weekends and major holidays. You have access to a nurse at all times for urgent questions. Please call the main number to the clinic [phone] and follow the prompts.  For any non-urgent questions, you may also contact your provider using MyChart. We now offer e-Visits for anyone 18 and older to request care online for non-urgent symptoms. For details visit [email or web address].   Also download the MyChart app! Go to the app store, search "MyChart", open the app, select [facility], and log in with your MyChart username and password.  Due to Covid, a mask is required upon entering the hospital/clinic. If you do not have a mask, one will be given to you upon arrival. For doctor visits, patients may have 1 support person aged 18 or older with them. For treatment visits, patients cannot have anyone with them due to current Covid guidelines and our immunocompromised population.  

## 2021-06-01 NOTE — Progress Notes (Signed)
Patient presents today for Retacrit per providers order.  Vital signs and labs reviewed and within parameters for treatment.  Patient has no new complaints at this time. Retacrit administration without incident; injection site WNL; see MAR for injection details.  Patient tolerated procedure well and without incident.  No questions or complaints noted at this time.  Discharge from clinic via wheelchair in stable condition.  Alert and oriented X 3.  Follow up with [facility] as scheduled.

## 2021-06-01 NOTE — Patient Instructions (Signed)
Health Maintenance, Female Adopting a healthy lifestyle and getting preventive care are important in promoting health and wellness. Ask your health care provider about: The right schedule for you to have regular tests and exams. Things you can do on your own to prevent diseases and keep yourself healthy. What should I know about diet, weight, and exercise? Eat a healthy diet  Eat a diet that includes plenty of vegetables, fruits, low-fat dairy products, and lean protein. Do not eat a lot of foods that are high in solid fats, added sugars, or sodium. Maintain a healthy weight Body mass index (BMI) is used to identify weight problems. It estimates body fat based on height and weight. Your health care provider can help determine your BMI and help you achieve or maintain a healthy weight. Get regular exercise Get regular exercise. This is one of the most important things you can do for your health. Most adults should: Exercise for at least 150 minutes each week. The exercise should increase your heart rate and make you sweat (moderate-intensity exercise). Do strengthening exercises at least twice a week. This is in addition to the moderate-intensity exercise. Spend less time sitting. Even light physical activity can be beneficial. Watch cholesterol and blood lipids Have your blood tested for lipids and cholesterol at 85 years of age, then have this test every 5 years. Have your cholesterol levels checked more often if: Your lipid or cholesterol levels are high. You are older than 85 years of age. You are at high risk for heart disease. What should I know about cancer screening? Depending on your health history and family history, you may need to have cancer screening at various ages. This may include screening for: Breast cancer. Cervical cancer. Colorectal cancer. Skin cancer. Lung cancer. What should I know about heart disease, diabetes, and high blood pressure? Blood pressure and heart  disease High blood pressure causes heart disease and increases the risk of stroke. This is more likely to develop in people who have high blood pressure readings, are of African descent, or are overweight. Have your blood pressure checked: Every 3-5 years if you are 18-39 years of age. Every year if you are 40 years old or older. Diabetes Have regular diabetes screenings. This checks your fasting blood sugar level. Have the screening done: Once every three years after age 40 if you are at a normal weight and have a low risk for diabetes. More often and at a younger age if you are overweight or have a high risk for diabetes. What should I know about preventing infection? Hepatitis B If you have a higher risk for hepatitis B, you should be screened for this virus. Talk with your health care provider to find out if you are at risk for hepatitis B infection. Hepatitis C Testing is recommended for: Everyone born from 1945 through 1965. Anyone with known risk factors for hepatitis C. Sexually transmitted infections (STIs) Get screened for STIs, including gonorrhea and chlamydia, if: You are sexually active and are younger than 85 years of age. You are older than 85 years of age and your health care provider tells you that you are at risk for this type of infection. Your sexual activity has changed since you were last screened, and you are at increased risk for chlamydia or gonorrhea. Ask your health care provider if you are at risk. Ask your health care provider about whether you are at high risk for HIV. Your health care provider may recommend a prescription medicine   to help prevent HIV infection. If you choose to take medicine to prevent HIV, you should first get tested for HIV. You should then be tested every 3 months for as long as you are taking the medicine. Pregnancy If you are about to stop having your period (premenopausal) and you may become pregnant, seek counseling before you get  pregnant. Take 400 to 800 micrograms (mcg) of folic acid every day if you become pregnant. Ask for birth control (contraception) if you want to prevent pregnancy. Osteoporosis and menopause Osteoporosis is a disease in which the bones lose minerals and strength with aging. This can result in bone fractures. If you are 65 years old or older, or if you are at risk for osteoporosis and fractures, ask your health care provider if you should: Be screened for bone loss. Take a calcium or vitamin D supplement to lower your risk of fractures. Be given hormone replacement therapy (HRT) to treat symptoms of menopause. Follow these instructions at home: Lifestyle Do not use any products that contain nicotine or tobacco, such as cigarettes, e-cigarettes, and chewing tobacco. If you need help quitting, ask your health care provider. Do not use street drugs. Do not share needles. Ask your health care provider for help if you need support or information about quitting drugs. Alcohol use Do not drink alcohol if: Your health care provider tells you not to drink. You are pregnant, may be pregnant, or are planning to become pregnant. If you drink alcohol: Limit how much you use to 0-1 drink a day. Limit intake if you are breastfeeding. Be aware of how much alcohol is in your drink. In the [place] one drink equals one 12 oz bottle of beer (355 mL), one 5 oz glass of wine (148 mL), or one 1 oz glass of hard liquor (44 mL). General instructions Schedule regular health, dental, and eye exams. Stay current with your vaccines. Tell your health care provider if: You often feel depressed. You have ever been abused or do not feel safe at home. Summary Adopting a healthy lifestyle and getting preventive care are important in promoting health and wellness. Follow your health care provider's instructions about healthy diet, exercising, and getting tested or screened for diseases. Follow your health care provider's  instructions on monitoring your cholesterol and blood pressure. This information is not intended to replace advice given to you by your health care provider. Make sure you discuss any questions you have with your health care provider. Document Revised: 11/19/2020 Document Reviewed: 09/04/2018 Elsevier Patient Education  2022 Elsevier Inc.  

## 2021-06-02 DIAGNOSIS — M1030 Gout due to renal impairment, unspecified site: Secondary | ICD-10-CM | POA: Diagnosis not present

## 2021-06-02 DIAGNOSIS — I130 Hypertensive heart and chronic kidney disease with heart failure and stage 1 through stage 4 chronic kidney disease, or unspecified chronic kidney disease: Secondary | ICD-10-CM | POA: Diagnosis not present

## 2021-06-02 DIAGNOSIS — I5043 Acute on chronic combined systolic (congestive) and diastolic (congestive) heart failure: Secondary | ICD-10-CM | POA: Diagnosis not present

## 2021-06-02 DIAGNOSIS — N184 Chronic kidney disease, stage 4 (severe): Secondary | ICD-10-CM | POA: Diagnosis not present

## 2021-06-02 DIAGNOSIS — E1122 Type 2 diabetes mellitus with diabetic chronic kidney disease: Secondary | ICD-10-CM | POA: Diagnosis not present

## 2021-06-02 DIAGNOSIS — J449 Chronic obstructive pulmonary disease, unspecified: Secondary | ICD-10-CM | POA: Diagnosis not present

## 2021-06-03 ENCOUNTER — Ambulatory Visit (INDEPENDENT_AMBULATORY_CARE_PROVIDER_SITE_OTHER): Payer: Medicare Other | Admitting: Family Medicine

## 2021-06-03 ENCOUNTER — Other Ambulatory Visit: Payer: Self-pay

## 2021-06-03 ENCOUNTER — Encounter: Payer: Self-pay | Admitting: Family Medicine

## 2021-06-03 VITALS — BP 119/55 | HR 60 | Resp 18 | Ht 67.0 in

## 2021-06-03 DIAGNOSIS — G8929 Other chronic pain: Secondary | ICD-10-CM

## 2021-06-03 DIAGNOSIS — I10 Essential (primary) hypertension: Secondary | ICD-10-CM

## 2021-06-03 DIAGNOSIS — Z23 Encounter for immunization: Secondary | ICD-10-CM | POA: Diagnosis not present

## 2021-06-03 DIAGNOSIS — R5381 Other malaise: Secondary | ICD-10-CM | POA: Diagnosis not present

## 2021-06-03 DIAGNOSIS — M25561 Pain in right knee: Secondary | ICD-10-CM

## 2021-06-03 DIAGNOSIS — M25551 Pain in right hip: Secondary | ICD-10-CM

## 2021-06-03 DIAGNOSIS — L603 Nail dystrophy: Secondary | ICD-10-CM | POA: Diagnosis not present

## 2021-06-03 DIAGNOSIS — L851 Acquired keratosis [keratoderma] palmaris et plantaris: Secondary | ICD-10-CM | POA: Diagnosis not present

## 2021-06-03 DIAGNOSIS — E890 Postprocedural hypothyroidism: Secondary | ICD-10-CM

## 2021-06-03 DIAGNOSIS — E1142 Type 2 diabetes mellitus with diabetic polyneuropathy: Secondary | ICD-10-CM | POA: Diagnosis not present

## 2021-06-03 MED ORDER — FLUTICASONE PROPIONATE 50 MCG/ACT NA SUSP: 2.0000 | Freq: Every day | NASAL | 11 refills | Status: AC

## 2021-06-03 NOTE — Patient Instructions (Signed)
F/U in 4.5  months, call if you need me before   Flu vaccine today  Recent blood work is stable  Careful, no falls  No changes in medication  Continue icing, rubs and trylenol and gabapentin as you are currently doing for arthritis pain  Thanks for choosing [facility], we consider it a privelige to serve you.

## 2021-06-05 ENCOUNTER — Emergency Department (HOSPITAL_COMMUNITY)
Admission: EM | Admit: 2021-06-05 | Discharge: 2021-06-25 | Disposition: E | Payer: Medicare Other | Attending: Emergency Medicine | Admitting: Emergency Medicine

## 2021-06-05 DIAGNOSIS — E039 Hypothyroidism, unspecified: Secondary | ICD-10-CM | POA: Insufficient documentation

## 2021-06-05 DIAGNOSIS — Z79899 Other long term (current) drug therapy: Secondary | ICD-10-CM | POA: Diagnosis not present

## 2021-06-05 DIAGNOSIS — Z7951 Long term (current) use of inhaled steroids: Secondary | ICD-10-CM | POA: Diagnosis not present

## 2021-06-05 DIAGNOSIS — N184 Chronic kidney disease, stage 4 (severe): Secondary | ICD-10-CM | POA: Insufficient documentation

## 2021-06-05 DIAGNOSIS — I5043 Acute on chronic combined systolic (congestive) and diastolic (congestive) heart failure: Secondary | ICD-10-CM | POA: Insufficient documentation

## 2021-06-05 DIAGNOSIS — J441 Chronic obstructive pulmonary disease with (acute) exacerbation: Secondary | ICD-10-CM | POA: Diagnosis not present

## 2021-06-05 DIAGNOSIS — Z950 Presence of cardiac pacemaker: Secondary | ICD-10-CM | POA: Diagnosis not present

## 2021-06-05 DIAGNOSIS — Z7901 Long term (current) use of anticoagulants: Secondary | ICD-10-CM | POA: Insufficient documentation

## 2021-06-05 DIAGNOSIS — R600 Localized edema: Secondary | ICD-10-CM | POA: Diagnosis not present

## 2021-06-05 DIAGNOSIS — I469 Cardiac arrest, cause unspecified: Secondary | ICD-10-CM

## 2021-06-05 DIAGNOSIS — I499 Cardiac arrhythmia, unspecified: Secondary | ICD-10-CM | POA: Diagnosis not present

## 2021-06-05 DIAGNOSIS — E1122 Type 2 diabetes mellitus with diabetic chronic kidney disease: Secondary | ICD-10-CM | POA: Diagnosis not present

## 2021-06-05 DIAGNOSIS — R404 Transient alteration of awareness: Secondary | ICD-10-CM | POA: Diagnosis not present

## 2021-06-05 DIAGNOSIS — Z87891 Personal history of nicotine dependence: Secondary | ICD-10-CM | POA: Diagnosis not present

## 2021-06-05 DIAGNOSIS — R0689 Other abnormalities of breathing: Secondary | ICD-10-CM | POA: Diagnosis not present

## 2021-06-05 DIAGNOSIS — Z8616 Personal history of COVID-19: Secondary | ICD-10-CM | POA: Diagnosis not present

## 2021-06-05 DIAGNOSIS — R0902 Hypoxemia: Secondary | ICD-10-CM | POA: Diagnosis not present

## 2021-06-05 DIAGNOSIS — I130 Hypertensive heart and chronic kidney disease with heart failure and stage 1 through stage 4 chronic kidney disease, or unspecified chronic kidney disease: Secondary | ICD-10-CM | POA: Diagnosis not present

## 2021-06-05 DIAGNOSIS — R4020 Unspecified coma: Secondary | ICD-10-CM | POA: Diagnosis not present

## 2021-06-05 DIAGNOSIS — R69 Illness, unspecified: Secondary | ICD-10-CM | POA: Diagnosis not present

## 2021-06-05 MED ORDER — CALCIUM CHLORIDE 10 % IV SOLN
INTRAVENOUS | Status: AC | PRN
  Administered 2021-06-05: 1 g via INTRAVENOUS

## 2021-06-05 MED ORDER — EPINEPHRINE 1 MG/10ML IJ SOSY
PREFILLED_SYRINGE | INTRAMUSCULAR | Status: AC | PRN
Start: 2021-06-05 — End: 2021-06-05
  Administered 2021-06-05 (×3): 1 mg via INTRAVENOUS

## 2021-06-06 ENCOUNTER — Encounter: Payer: Medicare Other | Admitting: Cardiovascular Disease

## 2021-06-06 ENCOUNTER — Encounter: Payer: Self-pay | Admitting: Family Medicine

## 2021-06-06 MED FILL — Medication: Qty: 1 | Status: AC

## 2021-06-06 NOTE — Assessment & Plan Note (Signed)
Increasing debility due to sevre osteoarthritis, fall precautions and pain managemen to continue as before

## 2021-06-06 NOTE — Assessment & Plan Note (Signed)
Severe osteoarthritis causing uncontrolled pain, no interest in intra articular injection, which she states is not good for her bones, continue current management

## 2021-06-06 NOTE — Progress Notes (Signed)
   [name] [name]     MRN: [id number]      DOB: Jan 07, 1932   HPI [name] [name] is here for follow up and re-evaluation of chronic medical conditions, medication management and review of any available recent lab and radiology data.  C/o right leg pain when attempting to stand,not  very mobile, denies any falls.   ROS Denies recent fever or chills. Denies sinus pressure, nasal congestion, ear pain or sore throat. Denies chest congestion, productive cough or wheezing. Denies chest pains, palpitations and leg swelling Denies abdominal pain, nausea, vomiting,diarrhea or constipation.   Denies dysuria, frequency, hesitancy or incontinence. Denies depression, anxiety or insomnia. Denies skin break down or rash.   PE  BP (!) 119/55   Pulse 60   Resp 18   Ht 5\' 7"  (1.702 m)   SpO2 90%   BMI 30.23 kg/m   Patient alert and oriented  HEENT: No facial asymmetry, EOMI,     Neck decreased ROM .  Chest: Clear to auscultation bilaterally.  CVS: S1, S2, no S3  ABD: Soft non tender.   Ext: No edema  MS: markedly decreased  ROM spine, shoulders, hips and knees.  Skin: Intact, no ulcerations or rash noted.  Psych: Good eye contact, normal affect. Memory intact not anxious or depressed appearing.  CNS: CN 2-12 intact,   Assessment & Plan  Chronic right hip pain Increased pain and debility, continue as before gabapentin, tylenol and topical medication.home safety reviewd  Chronic pain of right knee Severe osteoarthritis causing uncontrolled pain, no interest in intra articular injection, which she states is not good for her bones, continue current management  Essential hypertension Controlled, no change in medication   Physical debility Increasing debility due to sevre osteoarthritis, fall precautions and pain managemen to continue as before  Hypothyroidism, postsurgical Controlled, no change in medication

## 2021-06-06 NOTE — Assessment & Plan Note (Signed)
Controlled, no change in medication  

## 2021-06-06 NOTE — Assessment & Plan Note (Signed)
Increased pain and debility, continue as before gabapentin, tylenol and topical medication.home safety reviewd

## 2021-06-07 ENCOUNTER — Other Ambulatory Visit: Payer: Self-pay | Admitting: Family Medicine

## 2021-06-07 LAB — CBG MONITORING, ED
Glucose-Capillary: 217 mg/dL — ABNORMAL HIGH (ref 70–99)
Glucose-Capillary: 342 mg/dL — ABNORMAL HIGH (ref 70–99)

## 2021-06-09 ENCOUNTER — Ambulatory Visit: Payer: Medicare Other | Admitting: Family Medicine

## 2021-06-16 ENCOUNTER — Telehealth: Payer: Medicare Other

## 2021-06-24 ENCOUNTER — Ambulatory Visit (HOSPITAL_COMMUNITY): Payer: Medicare Other

## 2021-06-24 ENCOUNTER — Other Ambulatory Visit (HOSPITAL_COMMUNITY): Payer: Medicare Other

## 2021-06-24 ENCOUNTER — Ambulatory Visit (HOSPITAL_COMMUNITY): Payer: Medicare Other | Admitting: Physician Assistant

## 2021-06-25 NOTE — ED Provider Notes (Signed)
[facility] EMERGENCY DEPARTMENT Provider Note   CSN: [id number] Arrival date & time: June 20, 2021  1955  LEVEL 5 CAVEAT - CPR  History No chief complaint on file.   Brycelyn Gambino is a 87 y.o. female.  HPI 87 year old female presents in CPR.  History is from EMS only at this point.  Patient was apparently briefly unresponsive yesterday and EMS was called.  Her heart rate was in the 30s and they recommended she be transported but she declined as she had recovered.  They were called out again after she was found unresponsive on the toilet gasping.  They were bagging her but could not get an IV.  On the way she lost pulses and CPR was started.  No IV access able to be obtained.  They shocked her twice for ventricular tachycardia.  Past Medical History:  Diagnosis Date   Acute on chronic diastolic CHF (congestive heart failure) ([place]) 05/19/2012   Acute on chronic respiratory failure with hypoxia ([place]) 01/31/2020   Adenomatous polyp of colon 12/16/2002   Dr. [name]/[facility]   Allergy    Calculus of gallbladder with chronic cholecystitis without obstruction 02/09/2009   Qualifier: Diagnosis of  By: [name], Margaret     Cataract    CHB (complete heart block) ([place]) 10/07/2014       CHF (congestive heart failure) ([place])    a.  EF previously reduced to 25-30% b. EF improved to 60-65% by echo in 10/2017.    Chronic gout due to renal impairment    Per [employer], [facility] Electronic Medical Records System    Chronic kidney disease, stage I 2006   Cognitive communication deficit    Per [employer], [facility] Electronic [facility] Records [facility]    Constipation        Convulsion ([place])    Per [employer], [facility] Electronic [facility] Records [facility]    COPD (chronic obstructive pulmonary disease) ([place])        DJD (degenerative joint disease) of lumbar spine    DM (diabetes mellitus) ([place]) 2006   Dysphagia, unspecified    Per Matrix,  [facility] Electronic Medical Records System    Esophageal reflux    Glaucoma    Gout        H/O adenomatous polyp of colon 05/24/2012   2004 in [place] Last colonoscopy 2008 normal     H/O heart valve replacement with mechanical valve    a. s/p mechanical AVR in 2001 and mechanical MVR in 2004.   H/O wisdom tooth extraction    HIP PAIN, RIGHT 04/23/2009   Qualifier: Diagnosis of  By: [name], Margaret     Hyperkalemia    Per Zadie Cleverly, [facility] Electronic Medical Records System    Hyperlipemia    Hypertensive heart and kidney disease with HF and with CKD stage I-IV ([place])    Per [employer], [facility] Electronic Medical Records [facility]    IDA (iron deficiency anemia)    Parenteral iron/[name]   Insomnia        Localized edema    Left arm, Per [employer], [facility] Electronic Medical Records [facility]    Long term (current) use of anticoagulants    Per [employer], [facility] Electronic Medical Records System    Nausea without vomiting 06/07/2010   Qualifier: Diagnosis of  By: [name], Margaret     Non-ischemic cardiomyopathy [facility])    a. s/p MDT CRTD  Obesity, unspecified    Oxygen deficiency 2014   nocturnal;   Papilloma of breast 03/06/2012   This was diagnosed as a left breast papilloma by ultrasound characteristics and symptoms in 2010. Because of possible medical risk factors no surgical intervention has been done and it did doing annual followups. The area has been stable by physical examination and mammograms and ultrasound since 2010.    Presence of cardiac pacemaker    Per [employer], [facility] Electronic Medical Records System    Spinal stenosis of lumbar region 02/19/2013   Spondylolisthesis    Spondylosis    Type 2 diabetes mellitus with hyperglycemia ([place]) 07/15/2010   Type 2 diabetes mellitus with hypoglycemia without coma ([place])    Per [employer], [facility] Electronic Medical Records [facility]     Unspecified chronic bronchitis ([place])    Per [employer], [facility] Electronic [facility] Records [facility]    Unspecified hypothyroidism        Urinary tract infection, site not specified    Per [employer], [facility] Electronic [facility] Records [facility]    Varicose veins of lower extremities with other complications    Vertigo         Patient Active Problem List   Diagnosis Date Noted   Protein-calorie malnutrition, moderate ([place]) 02/08/2021   Aortic atherosclerosis ([place]) 01/31/2021   Other idiopathic peripheral autonomic neuropathy 01/31/2021   Anemia due to stage 4 chronic kidney disease ([place]) 01/31/2021   Acute renal failure superimposed on stage 4 chronic kidney disease (HCC)    Acute respiratory failure with hypoxia ([place]) 01/18/2021   Acute respiratory failure with hypoxia and hypercapnia ([address]) 01/18/2021   Gait abnormality 08/24/2020   Chronic constipation 08/09/2020   Hypertensive heart and kidney disease with chronic systolic congestive heart failure and stage 4 chronic kidney disease ([place]) 07/15/2020   Hyperlipidemia associated with type 2 diabetes mellitus ([place]) 07/15/2020   Seizure ([place]) 07/07/2020   Goals of care, counseling/discussion    Palliative care by specialist    Idiopathic hypotension    Arthritis of right knee 06/20/2020   Leg swelling 06/16/2020   Generalized osteoarthritis 05/09/2020   Primary osteoarthritis, left shoulder 85/10/7739   Acute systolic CHF (congestive heart failure) ([place]) 03/31/2020   Bilateral lower extremity edema    Right-sided epistaxis 03/28/2020   Acute pain of left shoulder 01/15/2020   AAA (abdominal aortic aneurysm) without rupture ([place]) 01/14/2020   COVID-19 virus detected 11/23/2019   Stage 3b chronic kidney disease ([place]) 08/04/2019   Dyspnea 05/30/2019   Anemia 05/30/2019   Gout    Glaucoma    GERD (gastroesophageal reflux disease)    Physical debility 04/28/2019   Encounter for examination following treatment at  hospital 11/03/2018   Hyponatremia 09/27/2018   H/O mitral valve replacement with mechanical valve 08/02/2018   Chronic right hip pain 04/16/2018   COPD with acute exacerbation ([place]) 11/15/2017   Trochanteric bursitis, right hip 03/07/2017   Acute on chronic combined systolic and diastolic heart failure ([place]) 11/16/2016   Persistent atrial fibrillation ([place])    Nocturnal hypoxia 10/08/2016   Dependence on nocturnal oxygen therapy 10/08/2016   Hematuria 03/20/2016   S/P ICD (internal cardiac defibrillator) procedure 02/08/2015   Presence of cardiac pacemaker 02/08/2015   CHB (complete heart block) ([place]) 10/07/2014   Fatigue 07/20/2014   Absolute anemia 06/24/2014   Osteopenia 02/10/2014   Encounter for therapeutic drug monitoring 10/29/2013   OA (osteoarthritis) of knee 02/19/2013   Chronic pain of right knee  01/02/2013   Hemolytic anemia (HCC) 09/24/2012   High risk medication use 05/24/2012   COPD (chronic obstructive pulmonary disease) ([place]) 05/19/2012   Chronic anticoagulation, on coumadin for Mechanical AVR and MVR 04/01/2012   Cardiorenal syndrome with renal failure 03/22/2012   Hx of aortic valve replacement, mechanical 03/22/2012   S/P MVR (mitral valve replacement) 03/22/2012   Biventricular automatic implantable cardioverter defibrillator in situ 03/22/2012   CKD (chronic kidney disease), stage IV ([place]) 03/22/2012   Warfarin-induced coagulopathy ([place]) 03/21/2012   Iron deficiency 10/04/2011   Allergic rhinitis 02/14/2011   Mitral valve disease 07/15/2010   Sinus node dysfunction ([place]) 07/15/2010   Type 2 diabetes mellitus with hypoglycemia without coma ([place]) 07/15/2010   VARICOSE VEINS LOWER EXTREMITIES W/OTH COMPS 02/09/2009   Prediabetes 06/15/2008   Hypothyroidism, postsurgical 02/24/2008   Hyperlipidemia LDL goal <70 02/24/2008   Essential hypertension 02/24/2008   Non-ischemic cardiomyopathy with ICD 02/24/2008   Spondylosis 02/24/2008    Past Surgical  History:  Procedure Laterality Date   AORTIC AND MITRAL VALVE REPLACEMENT  2001   BI-VENTRICULAR IMPLANTABLE CARDIOVERTER DEFIBRILLATOR UPGRADE N/A 01/18/2015   Procedure: BI-VENTRICULAR IMPLANTABLE CARDIOVERTER DEFIBRILLATOR UPGRADE;  Surgeon: Evans Lance, MD;  Location: [facility] CATH LAB;  Service: Cardiovascular;  Laterality: N/A;   BIV ICD GENERATOR CHANGEOUT N/A 01/30/2019   Procedure: BIV ICD GENERATOR CHANGEOUT;  Surgeon: Evans Lance, MD;  Location: [place] [facility] CV LAB;  Service: Cardiovascular;  Laterality: N/A;   CARDIAC CATHETERIZATION     CARDIAC VALVE REPLACEMENT     CARDIOVERSION N/A 11/13/2016   Procedure: CARDIOVERSION;  Surgeon: Sanda Klein, MD;  Location: [facility] ENDOSCOPY;  Service: Cardiovascular;  Laterality: N/A;   COLONOSCOPY  06/12/2007   Rourk- Normal rectum/Normal colon   COLONOSCOPY  12/16/02   small hemorrhoids   COLONOSCOPY  06/20/2012   Procedure: COLONOSCOPY;  Surgeon: Daneil Dolin, MD;  Location[facility];  Service: Endoscopy;  Laterality: N/A;  10:45   defibrillator implanted 2006     DOPPLER ECHOCARDIOGRAPHY  2012   DOPPLER ECHOCARDIOGRAPHY  05,06,07,08,09,2011   ESOPHAGOGASTRODUODENOSCOPY  06/12/2007   Rourk- normal esophagus, small hiatal hernia, otherwise normal stomach, D1, D2   EYE SURGERY Right 2005   EYE SURGERY Left 2006   ICD LEAD REMOVAL Left 02/08/2015   Procedure: ICD LEAD REMOVAL;  Surgeon: Evans Lance, MD;  Location: [place];  Service: Cardiovascular;  Laterality: Left;  "Will plan extraction and insertion of a BiV PM"  **Dr. Roxan Hockey backing up case**   IMPLANTABLE CARDIOVERTER DEFIBRILLATOR (ICD) GENERATOR CHANGE Left 02/08/2015   Procedure: ICD GENERATOR CHANGE;  Surgeon: Evans Lance, MD;  Location: [place];  Service: Cardiovascular;  Laterality: Left;   INSERT / REPLACE / REMOVE PACEMAKER     PACEMAKER INSERTION  May 2016   pacemaker placed  2004   right breast cyst removed     benign    right cataract removed     2005   TEE  WITHOUT CARDIOVERSION N/A 11/13/2016   Procedure: TRANSESOPHAGEAL ECHOCARDIOGRAM (TEE);  Surgeon: Sanda Klein, MD;  Location: [facility] ENDOSCOPY;  Service: Cardiovascular;  Laterality: N/A;   THYROIDECTOMY       OB History     Gravida      Para      Term      Preterm      AB      Living  0      SAB      IAB      Ectopic  Multiple      Live Births              Family History  Problem Relation Age of Onset   Pancreatic cancer Mother    Heart disease Father    Heart disease Sister    Heart attack Sister    Heart disease Brother    Diabetes Brother    Heart disease Brother    Diabetes Brother    Hypertension Brother    Lung cancer Brother     Social History   Tobacco Use   Smoking status: Former    Packs/day: 0.75    Years: 40.00    Pack years: 30.00    Types: Cigarettes    Quit date: 03/08/1987    Years since quitting: 34.2   Smokeless tobacco: Never  Vaping Use   Vaping Use: Never used  Substance Use Topics   Alcohol use: No    Alcohol/week: 0.0 standard drinks   Drug use: No    Home Medications Prior to Admission medications   Medication Sig Start Date End Date Taking? Authorizing Provider  acetaminophen (TYLENOL) 500 MG tablet Take 500 mg by mouth every 6 (six) hours as needed.    [provider]  albuterol (PROVENTIL) (2.5 MG/3ML) 0.083% nebulizer solution Take 3 mLs (2.5 mg total) by nebulization in the morning and at bedtime. 03/02/21   Gerlene Fee, NP  albuterol (VENTOLIN HFA) 108 (90 Base) MCG/ACT inhaler Inhale 2 puffs into the lungs every 4 (four) hours as needed for wheezing or shortness of breath. 04/04/21   Fayrene Helper, MD  allopurinol (ZYLOPRIM) 100 MG tablet Take 1 tablet (100 mg total) by mouth daily. 04/12/21   Fayrene Helper, MD  Amino Acids-Protein Hydrolys (FEEDING SUPPLEMENT, PRO-STAT SUGAR FREE 64,) LIQD Take 30 mLs by mouth 2 (two) times daily between meals.    [provider]  Janne Lab Oil [facility]) OINT Apply topically. topical, Every Shift, Apply to bilateral buttocks and sacrum qshift.    [provider]  cholecalciferol (VITAMIN D3) 25 MCG (1000 UNIT) tablet Take 1 tablet (1,000 Units total) by mouth in the morning. 07/08/20   Desiree Hane, MD  fluticasone (FLONASE) 50 MCG/ACT nasal spray Place 2 sprays into both nostrils daily. 06/03/21   Fayrene Helper, MD  fluticasone-salmeterol (ADVAIR) 250-50 MCG/ACT AEPB Inhale 1 puff into the lungs in the morning and at bedtime. 03/02/21   Gerlene Fee, NP  folic acid (FOLVITE) 1 MG tablet TAKE ONE TABLET BY MOUTH ONCE DAILY. 05/09/21   Fayrene Helper, MD  furosemide (LASIX) 40 MG tablet TAKE (1) TABLET BY MOUTH DAILY. 05/09/21   Fayrene Helper, MD  gabapentin (NEURONTIN) 300 MG capsule TAKE (1) CAPSULE BY MOUTH TWICE DAILY 05/09/21   Fayrene Helper, MD  hydrALAZINE (APRESOLINE) 10 MG tablet TAKE (1) TABLET BY MOUTH EVERY EIGHT HOURS. 05/09/21   Fayrene Helper, MD  isosorbide mononitrate (IMDUR) 30 MG 24 hr tablet TAKE 1/2 TABLET BY MOUTH ONCE DAILY. 05/09/21   Fayrene Helper, MD  levETIRAcetam (KEPPRA) 500 MG tablet TAKE (1) TABLET BY MOUTH TWICE DAILY AT 9AM AND 9PM 05/09/21   Fayrene Helper, MD  metoprolol succinate (TOPROL-XL) 50 MG 24 hr tablet Take 1 tablet (50 mg total) by mouth daily. Take with or immediately following a meal. 04/12/21   Fayrene Helper, MD  nitroGLYCERIN (NITROSTAT) 0.4 MG SL tablet DISSOLVE 1 TABLET SUBLINGUALLY AS NEEDED FOR CHEST PAIN, MAY  REPEAT EVERY 5 MINUTES. AFTER 3 CALL 911. 03/02/21   Gerlene Fee, NP  NON FORMULARY Diet :mechanical soft diet with ground meats, NAS, cons CHO 07/26/20   [provider]  ondansetron (ZOFRAN-ODT) 4 MG disintegrating tablet TAKE (1) TABLET BY MOUTH EVERY EIGHT HOURS AS NEEDED FOR NAUSEA OR VOMITING 04/04/21   Fayrene Helper, MD  OXYGEN Inhale 3 L into the lungs continuous. 07/08/20   [provider]  polyethylene glycol (MIRALAX / GLYCOLAX) 17 g packet Take 17 g by mouth daily as needed. 07/08/20   Oretha Milch D, MD  potassium chloride SA (KLOR-CON) 20 MEQ tablet Take 1 tablet (20 mEq total) by mouth daily. Patient taking differently: Take 20 mEq by mouth daily. 04/21/21- takes one QOD per pt- per nephrologist order 04/12/21   Fayrene Helper, MD  pravastatin (PRAVACHOL) 40 MG tablet TAKE ONE TABLET BY MOUTH ONCE DAILY. 04/12/21   Fayrene Helper, MD  SYNTHROID 88 MCG tablet TAKE 1 TABLET BEFORE BREAKFAST. 04/12/21   Fayrene Helper, MD  UNABLE TO FIND Compression Stockings R-  Ankle  26.5cm   Calf 38cm  Leg  44cm L-  Ankle  27.5 cm   Calf 39cm  Leg  44cm Patient taking differently: Compression Stockings R-  Ankle  26.5cm   Calf 38cm  Leg  44cm L-  Ankle  27.5 cm   Calf 39cm  Leg  44cm 09/15/20   Fayrene Helper, MD  warfarin (COUMADIN) 10 MG tablet Take warfarin 15mg  tonight then continue 10mg  daily except 5mg  on Mondays, Wednesdays and Fridays or as directed 04/12/21   Arnoldo Lenis, MD    Allergies    Penicillins, Aspirin, Fentanyl, and Niacin  Review of Systems   Review of Systems  Unable to perform ROS: Patient unresponsive   Physical Exam Updated Vital Signs There were no vitals taken for this visit.  Physical Exam Vitals and nursing note reviewed.  Constitutional:      Appearance: She is well-developed.  HENT:     Head: Normocephalic and atraumatic.     Right Ear: External ear normal.     Left Ear: External ear normal.     Nose: Nose normal.  Eyes:     General:        Right eye: No discharge.        Left eye: No discharge.  Cardiovascular:     Pulses:          Femoral pulses are 0 on the right side. Pulmonary:     Comments: Intubated with [place] airway, no effort Abdominal:     General: There is no distension.     Palpations: Abdomen is soft.  Musculoskeletal:     Right lower leg: Edema present.     Left lower leg: Edema present.   Skin:    General: Skin is warm and dry.  Neurological:     Mental Status: She is unresponsive.    ED Results / Procedures / Treatments   Labs (all labs ordered are listed, but only abnormal results are displayed) Labs Reviewed - No data to display  EKG None  Radiology No results found.  Procedures Ultrasound ED Echo  Date/Time: 06-24-21 8:21 PM Performed by: Sherwood Gambler, MD Authorized by: Sherwood Gambler, MD   Procedure details:    Indications: cardiac arrest     Views: subxiphoid     Images: not archived     Limitations:  Acoustic shadowing Findings:  Pericardium: no pericardial effusion     Cardiac Activity: no cardiac activity     Cardiopulmonary Resuscitation (CPR) Procedure Note Directed/Performed by: Ephraim Hamburger I personally directed ancillary staff and/or performed CPR in an effort to regain return of spontaneous circulation and to maintain cardiac, neuro and systemic perfusion.   Medications Ordered in ED Medications - No data to display  ED Course  I have reviewed the triage vital signs and the nursing notes.  Pertinent labs & imaging results that were available during my care of the patient were reviewed by me and considered in my medical decision making (see chart for details).    MDM Rules/Calculators/A&P                           Patient unfortunately was never able to regain spontaneous circulation.  Multiple rounds of CPR.  Extremely poor prognosis and given no evidence of spontaneous cardiac activity we decided to call the code.  I informed the nephew and niece at the bedside.  They did inform me that she has been progressively more lethargic for the last couple days.  Unclear exact cause of her death. Final Clinical Impression(s) / ED Diagnoses Final diagnoses:  Cardiac arrest [facility])    Rx / DC Orders ED Discharge Orders     None        Sherwood Gambler, MD 06/06/21 1607

## 2021-06-25 NOTE — ED Triage Notes (Signed)
Pt arrived CPR in progress from home with [facility].

## 2021-06-25 NOTE — Code Documentation (Signed)
Pt arrived with [facility]  airway via EMS. Bilateral lung sounds present with equal chest rise et fall noted.

## 2021-06-25 NOTE — ED Notes (Signed)
Patient bagged in er by RT. No intubation Just CPR

## 2021-06-25 DEATH — deceased

## 2021-07-15 ENCOUNTER — Telehealth: Payer: Self-pay | Admitting: Family Medicine

## 2021-07-15 NOTE — Telephone Encounter (Signed)
Orland Dec from [place] home called to have Death certificate signed for former patient Call back [phone]

## 2021-07-18 ENCOUNTER — Ambulatory Visit (HOSPITAL_COMMUNITY): Payer: Medicare Other

## 2021-07-18 ENCOUNTER — Ambulatory Visit (HOSPITAL_COMMUNITY): Payer: Medicare Other | Admitting: Physician Assistant

## 2021-07-18 ENCOUNTER — Other Ambulatory Visit (HOSPITAL_COMMUNITY): Payer: Medicare Other

## 2021-07-19 IMAGING — DX DG CHEST 2V
2 series · 2 of 2 positions shown · non-contrast
Comparison: Chest x-ray 07/08/2020, chest x-ray 10/14/2018, CT
chest 07/30/2018, CT chest 05/27/2020

CLINICAL DATA: Cough, congestion

EXAM:
CHEST - 2 VIEW

[chest lat]
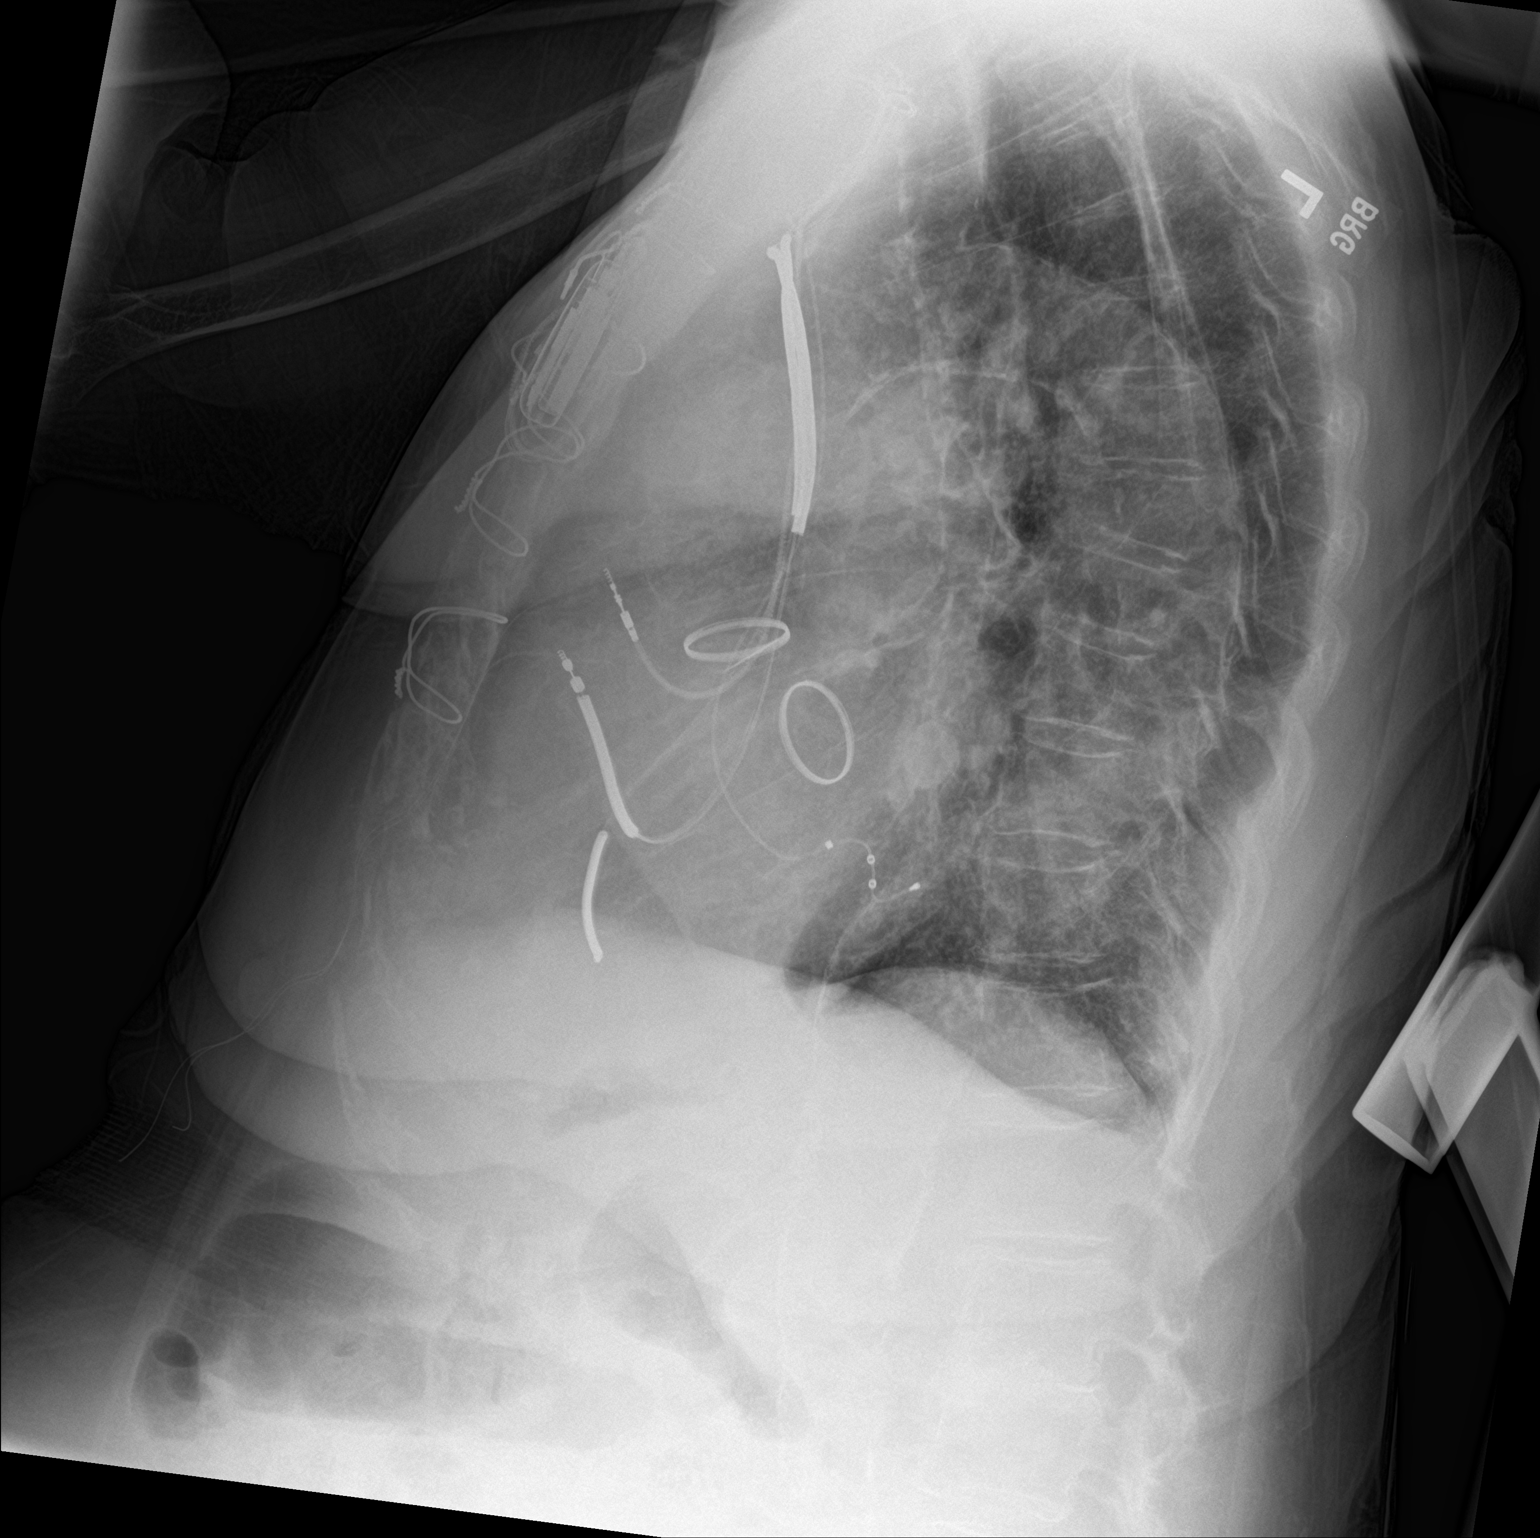

[chest ap]
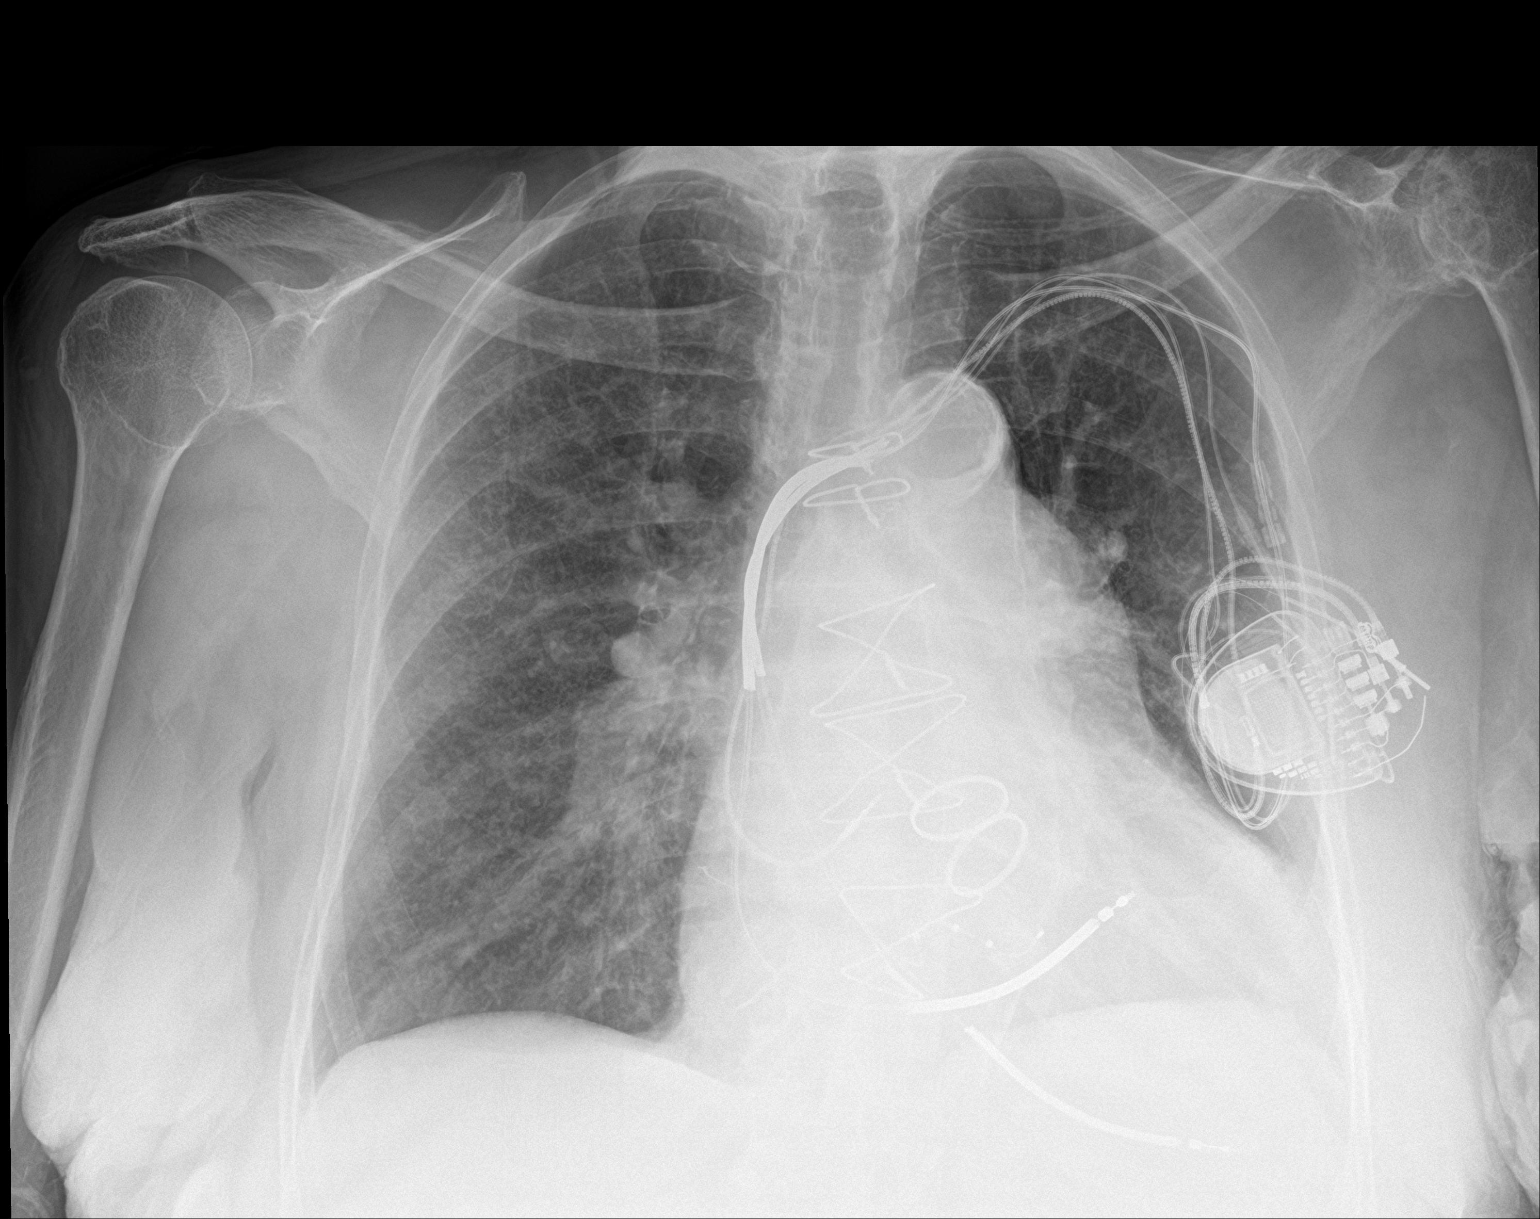

[2 of 2 positions shown; findings below may reference images not displayed]

FINDINGS: Aortic and mitral valve replacements. 3 lead left chest wall cardiac
pacemaker/defibrillator in similar position. Sternotomy wires appear
intact.

The heart size and mediastinal contours are unchanged.
Redemonstration of a enlarged cardiac silhouette as well as enlarged
main pulmonary artery. Aortic arch calcifications.

Hyperinflation of the lungs consistent with known emphysematous
changes. No focal consolidation. Redemonstration of coarsened
interstitial markings with no overt pulmonary edema. No pleural
effusion. No pneumothorax.

No acute osseous abnormality. Severe degenerative changes of the
left shoulder.
IMPRESSION: No active cardiopulmonary disease in a patient with chronic
coarsened interstitial markings, cardiomegaly, enlarged main
pulmonary artery, and emphysematous changes.

## 2021-10-24 IMAGING — DX DG SHOULDER 1V*L*
2 series · 2 of 2 positions shown · non-contrast
Comparison: Radiographs 05/05/2004

CLINICAL DATA: Left shoulder pain.

EXAM:
LEFT SHOULDER

[shoulder obl]
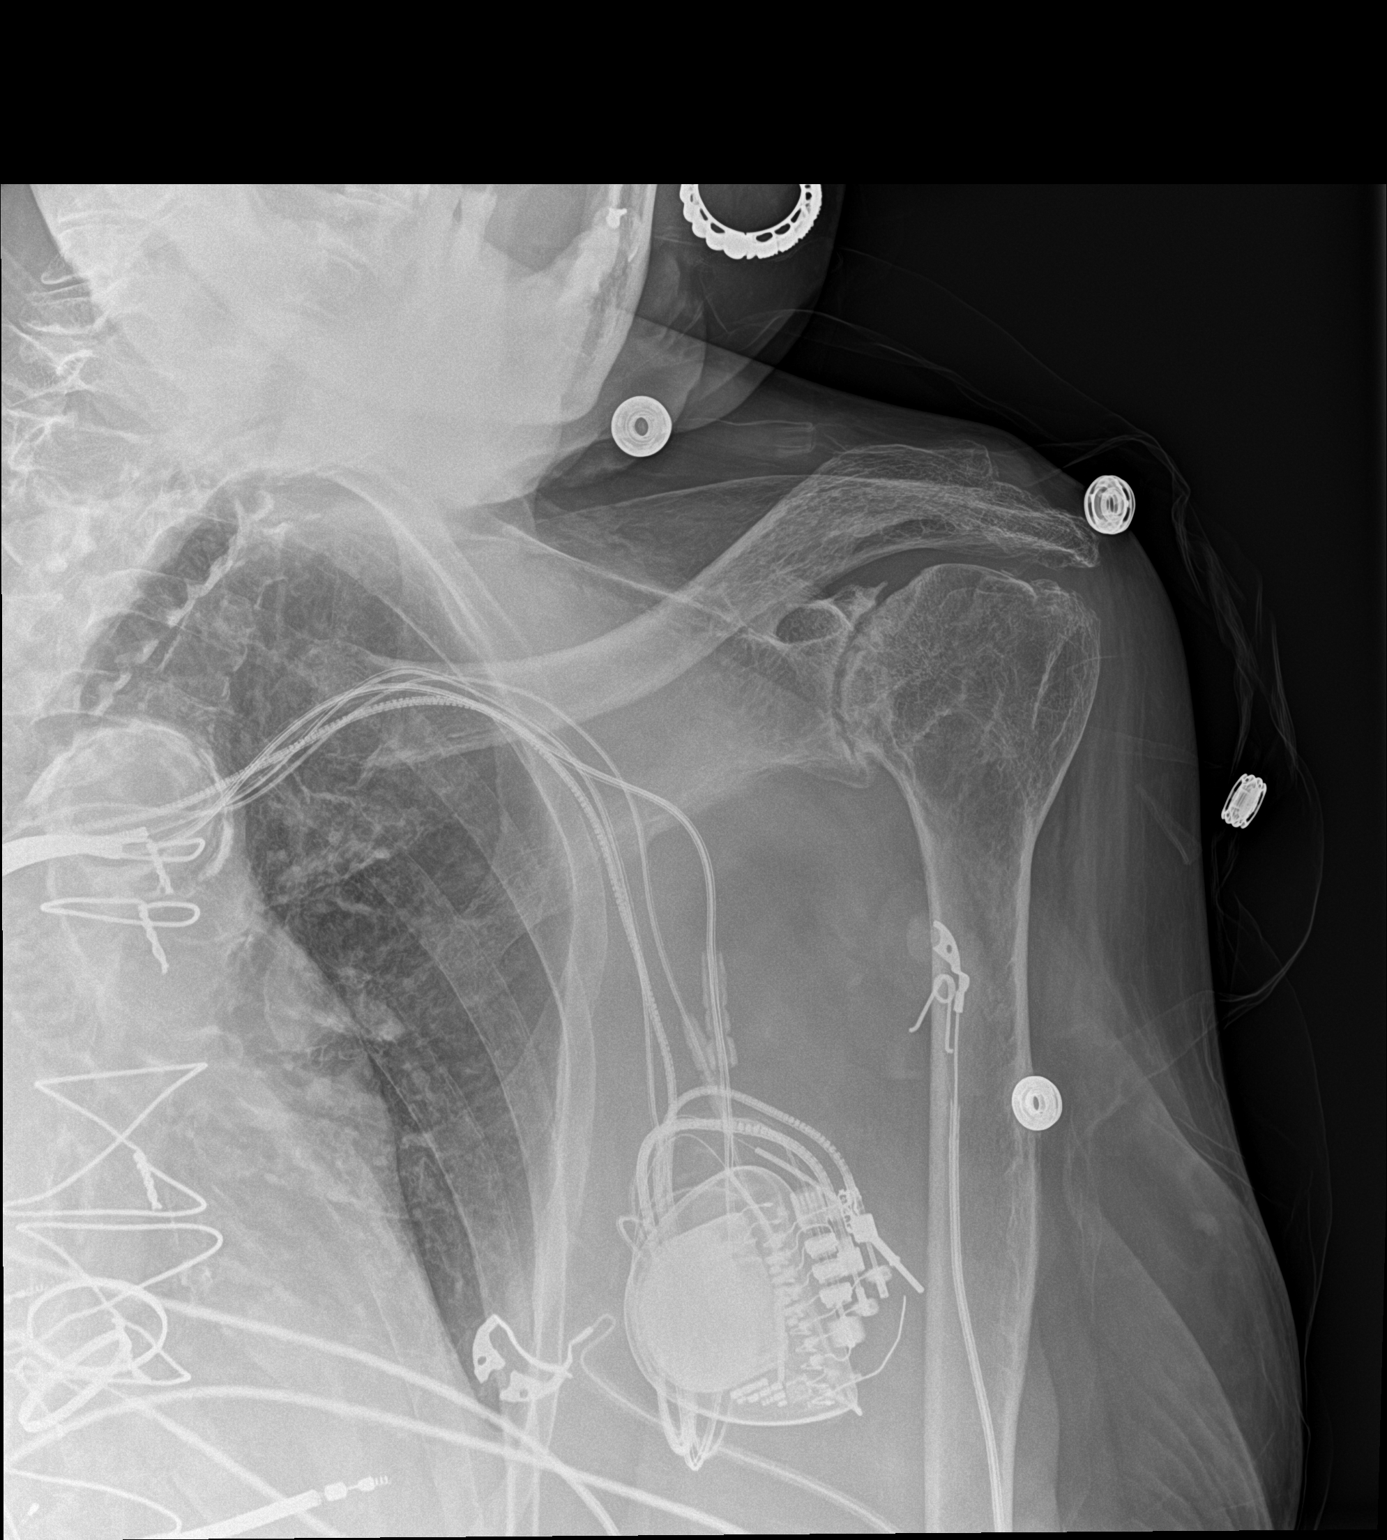

[shoulder y view]
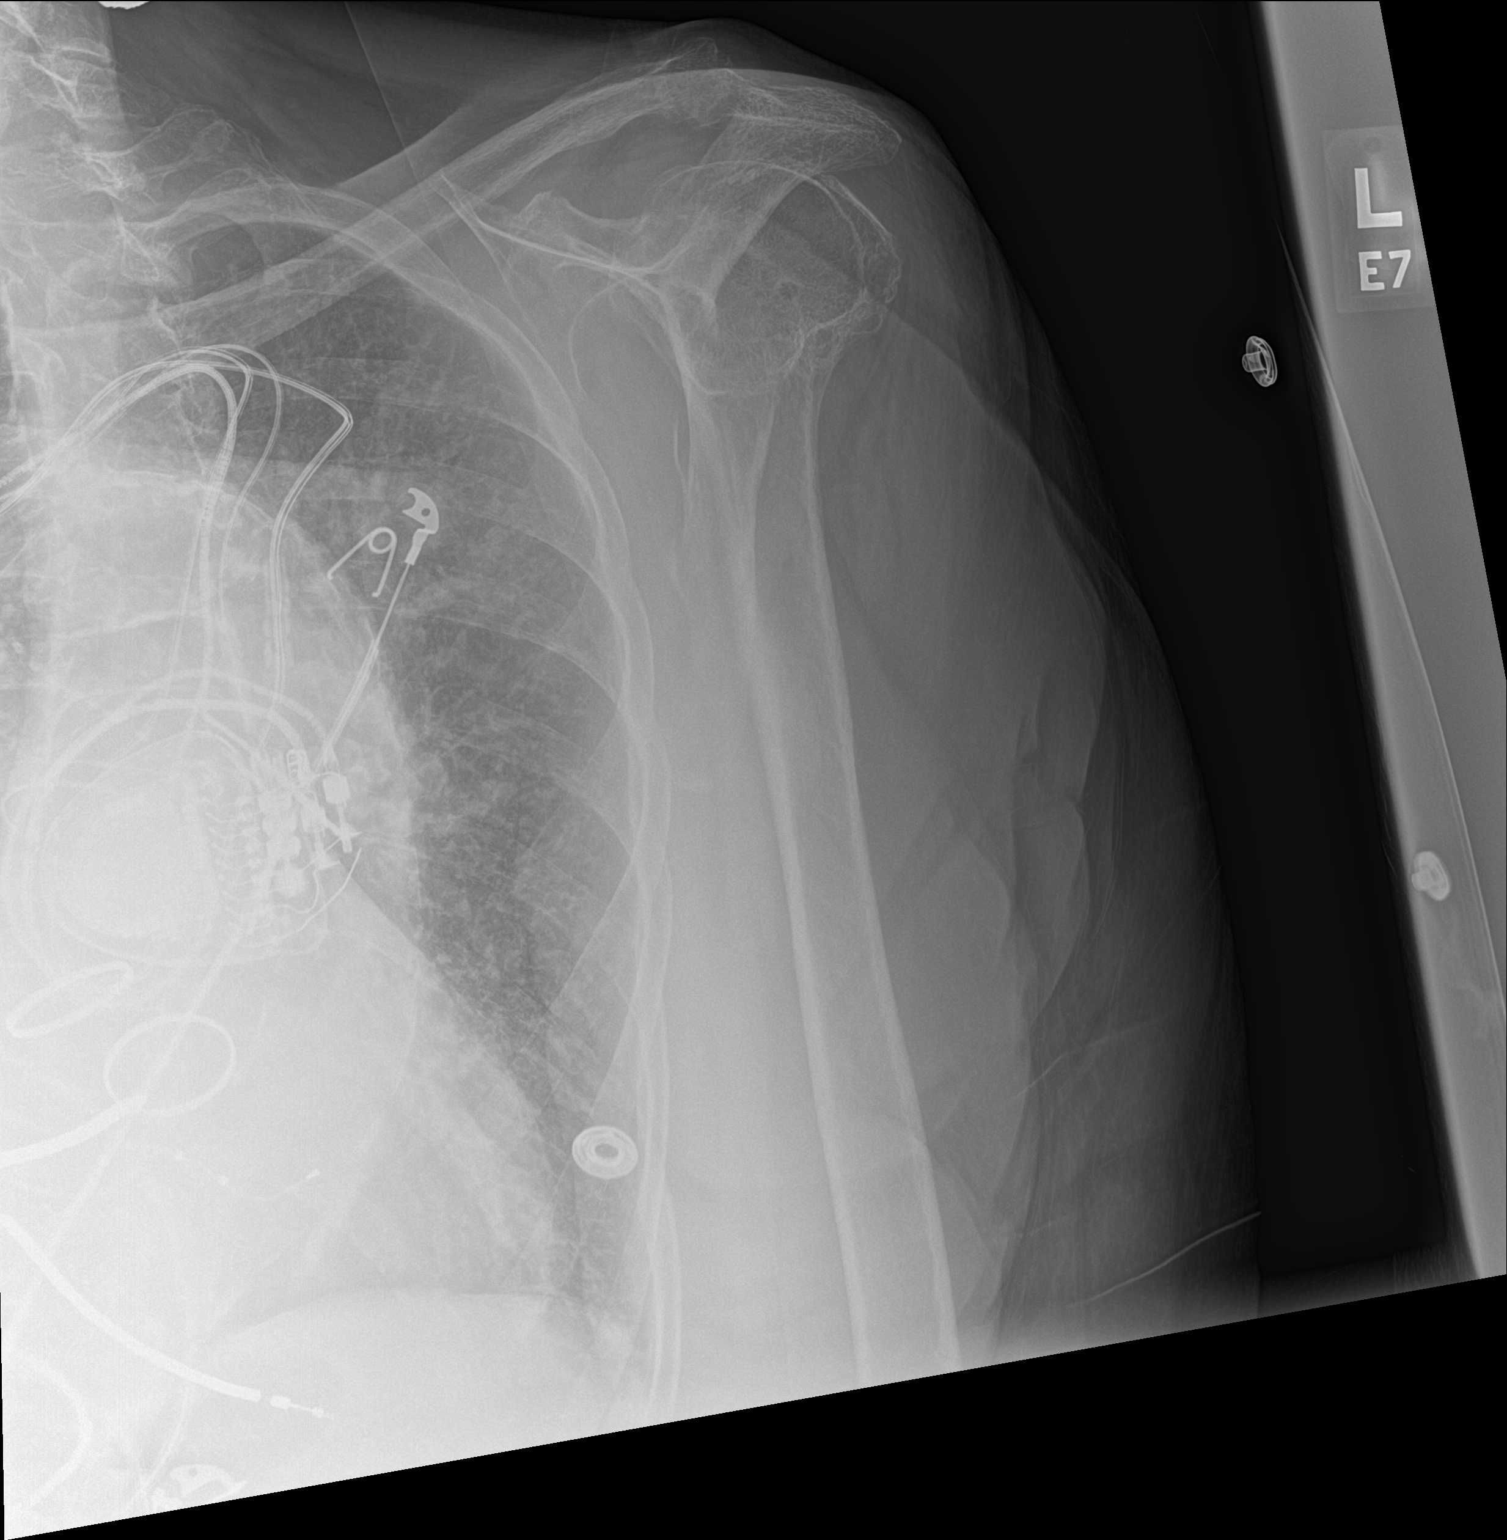

[2 of 2 positions shown; findings below may reference images not displayed]

FINDINGS: Severe and markedly progressive degenerative changes at the
glenohumeral joint. Suspect AVN involving the humeral head. The AC
joint is intact. Mild degenerative changes. No acute bony findings
are identified. The visualized left ribs are intact.
IMPRESSION: 1. Severe and markedly progressive degenerative changes at the
glenohumeral joint. Suspect AVN involving the humeral head.
2. No acute bony findings.

## 2021-10-24 IMAGING — DX DG CHEST 1V PORT
1 series · 1 of 1 positions shown · non-contrast
Comparison: Portable exam 5065 hours compared to 09/10/2020

CLINICAL DATA: Shortness of breath with exertion, bump shoulder
ends wall trying to maintain balance, history of respiratory
failure, type II diabetes mellitus, CHF, COPD, GERD, non ischemic
cardiomyopathy, hypertension

EXAM:
PORTABLE CHEST 1 VIEW

[chest ap]
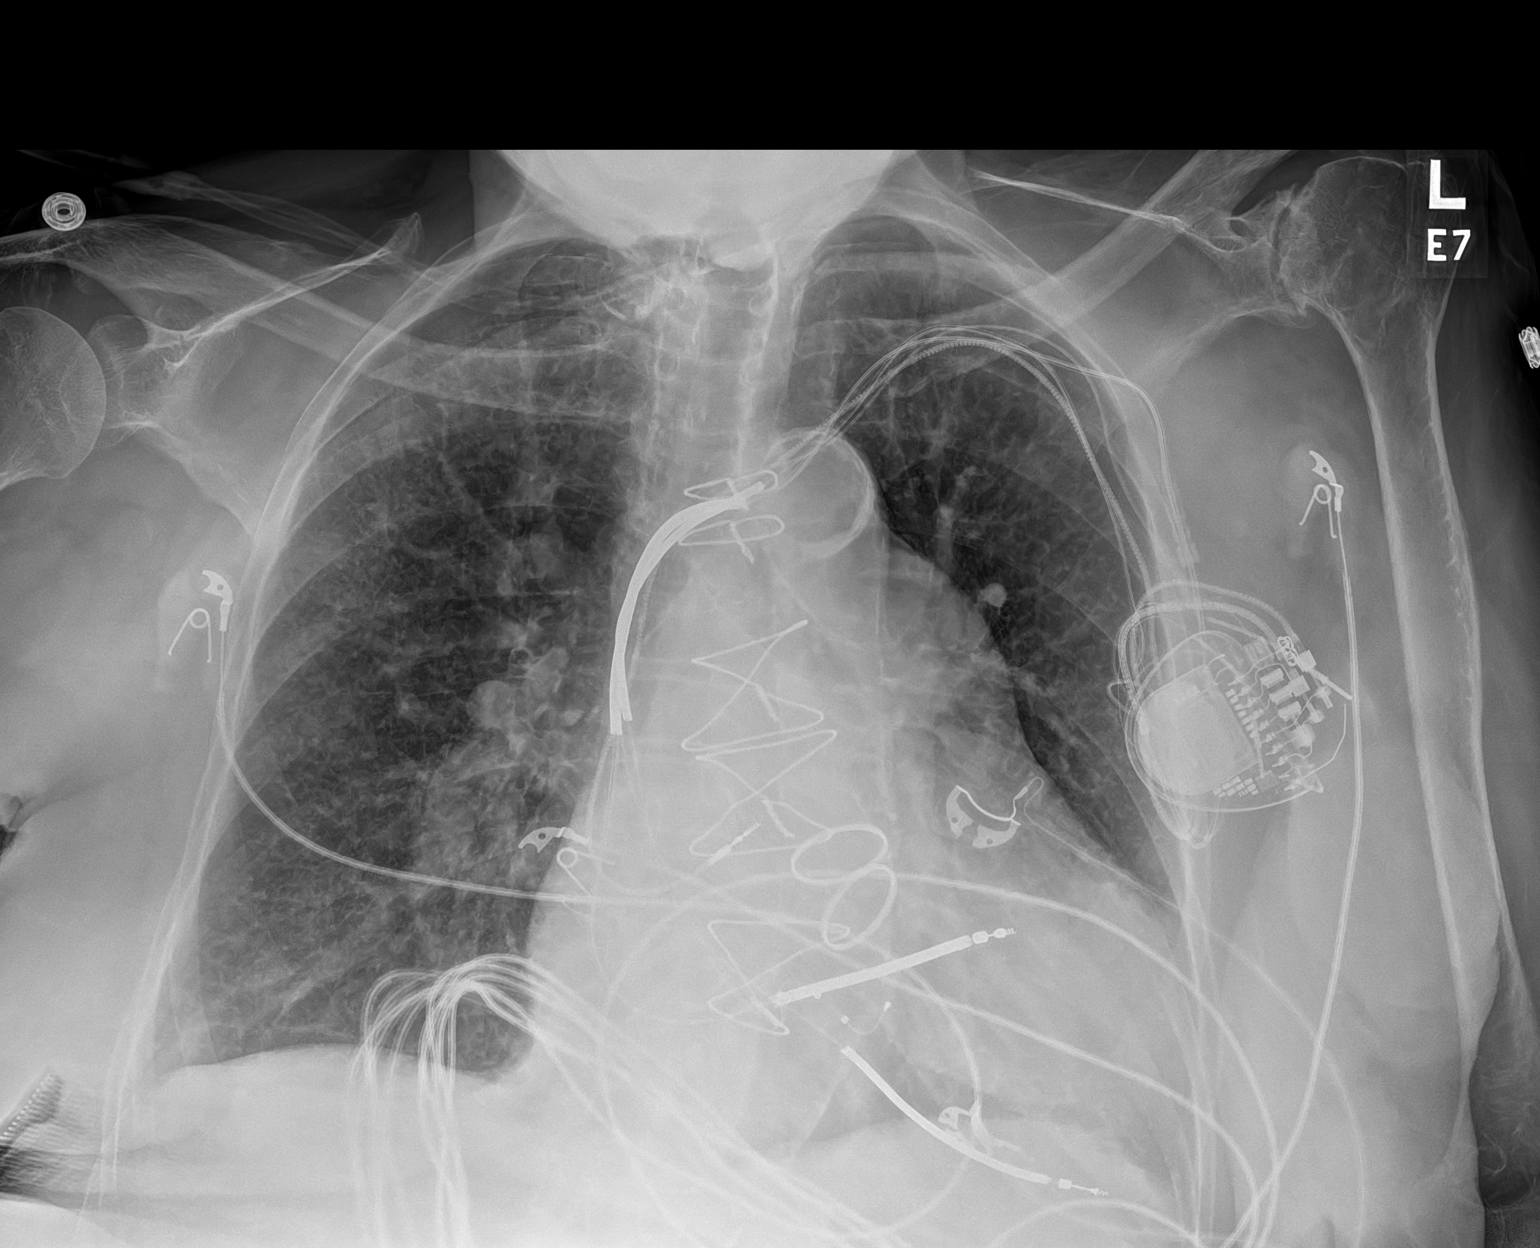

[1 of 1 positions shown; findings below may reference images not displayed]

FINDINGS: LEFT subclavian transvenous ICD with leads projecting over RIGHT
atrium, RIGHT ventricle, and coronary sinus.

Enlargement of cardiac silhouette post bivalvular replacement.

Pulmonary vascular congestion.

Atherosclerotic calcification aorta.

Peribronchial thickening with chronic accentuation of interstitial
markings, may reflect mild chronic failure or chronic interstitial
disease.

No acute infiltrate, pleural effusion, or pneumothorax.

Bones demineralized with advanced LEFT glenohumeral degenerative
changes.
IMPRESSION: Enlargement of cardiac silhouette with slight pulmonary vascular
congestion post bivalvular heart surgery and ICD.

Bronchitic changes with chronic accentuation of interstitial
markings.

No acute abnormalities.

## 2021-11-08 IMAGING — DX DG CHEST 2V
2 series · 2 of 2 positions shown · non-contrast
Comparison: December 16, 2020 and September 10, 2020

CLINICAL DATA: Lower extremity edema

EXAM:
CHEST - 2 VIEW

[chest lat]
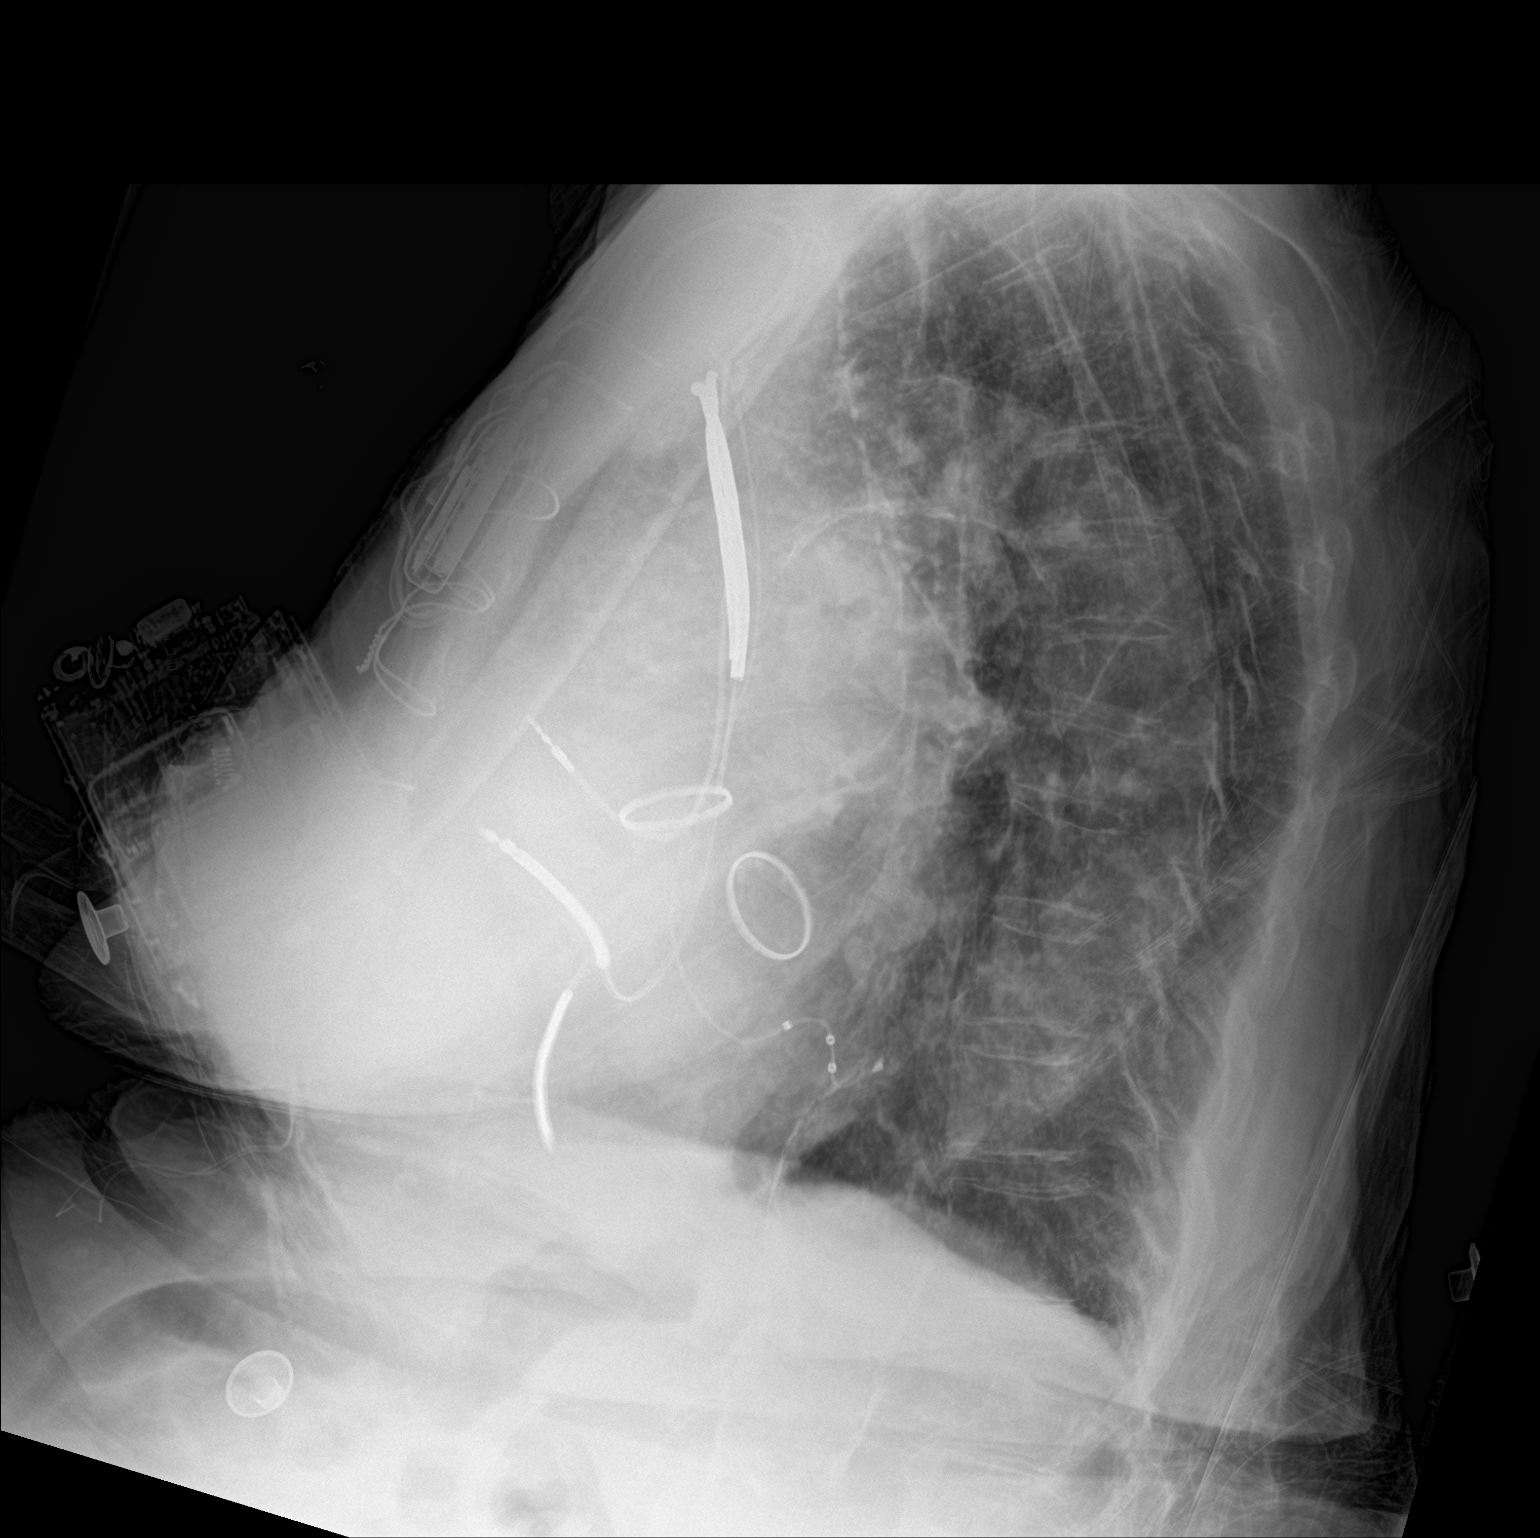

[chest ap]
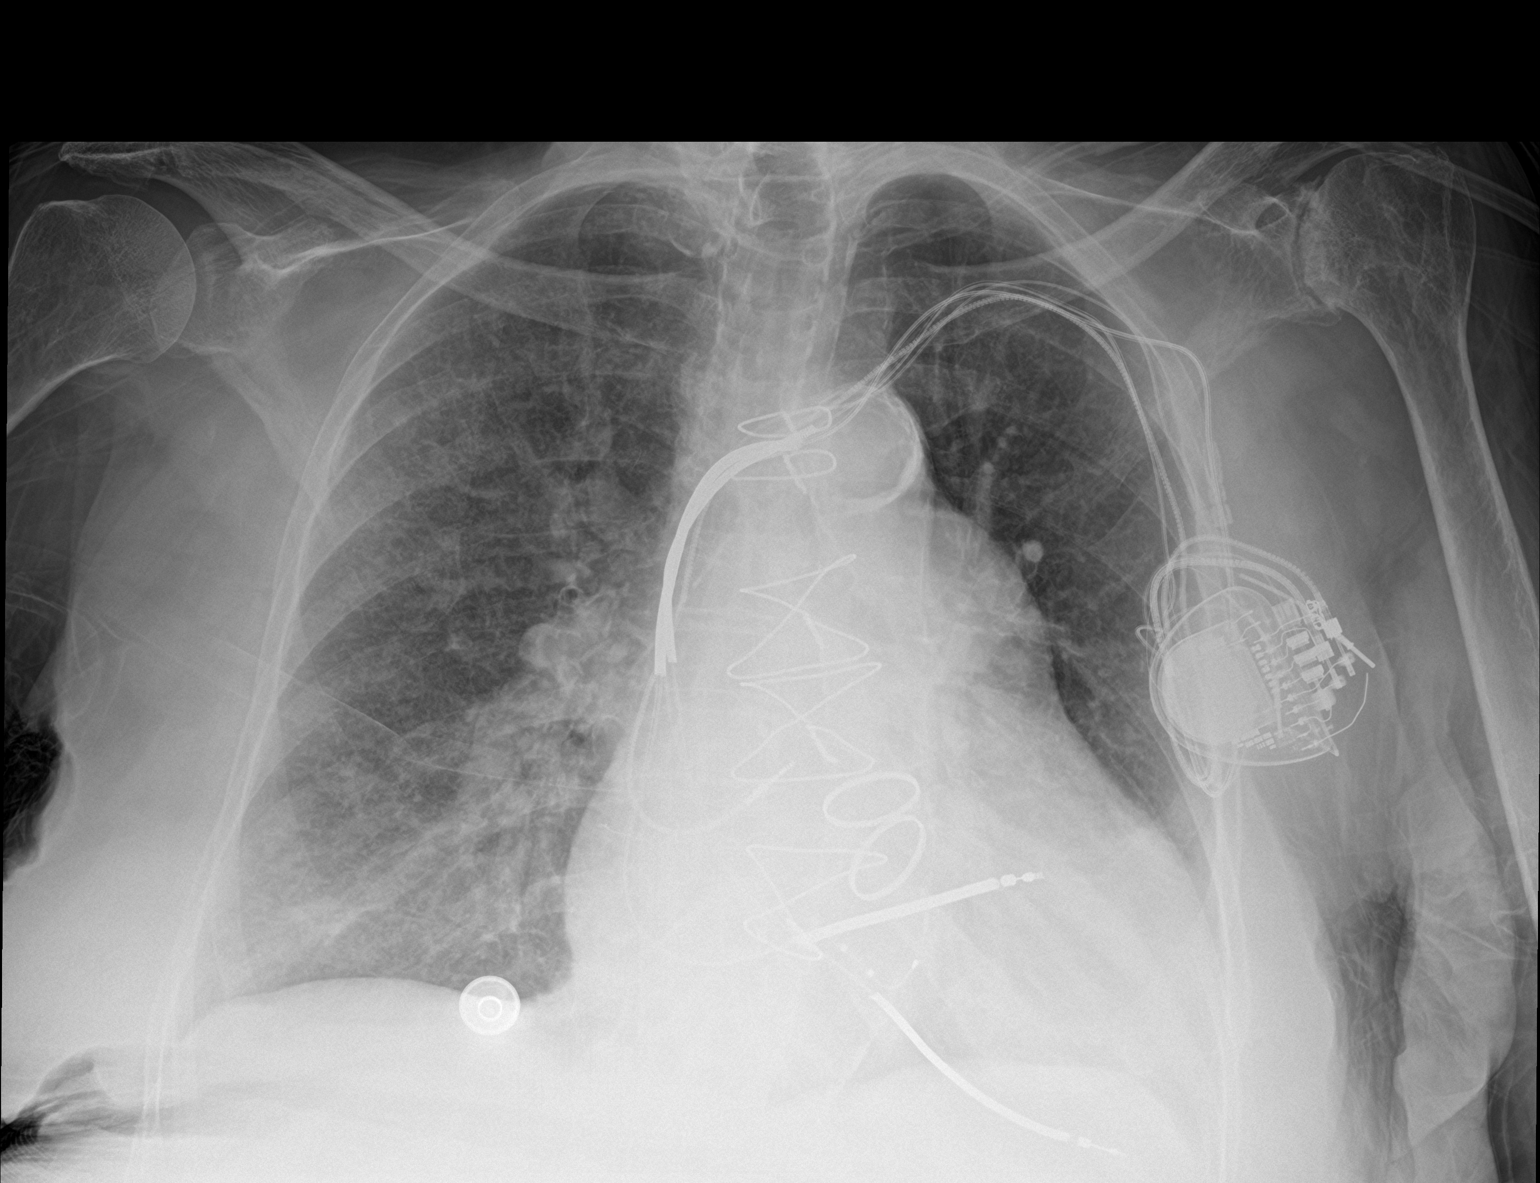

[2 of 2 positions shown; findings below may reference images not displayed]

FINDINGS: There remains diffuse interstitial thickening, likely representing
chronic bronchitis. No appreciable alveolar edema or consolidation.
Heart remains enlarged. Pacemaker leads are attached to the right
atrium, right ventricle, and coronary sinus. Patient is status post
mitral and aortic valve replacements. There is central pulmonary
vascular prominence, likely reflecting underlying pulmonary arterial
hypertension. There is aortic atherosclerosis. Bones are
osteoporotic.
IMPRESSION: Suspect underlying chronic bronchitis. A mild degree of chronic
congestive heart failure could present similarly. No airspace
consolidation.

There is cardiomegaly with apparent pulmonary arterial hypertension.
Postoperative changes noted. Aortic Atherosclerosis ([id number]).
Bones osteoporotic.

## 2021-11-26 IMAGING — DX DG CHEST 1V PORT
1 series · 1 of 1 positions shown · non-contrast
Comparison: Chest radiograph dated 12/31/2020.

CLINICAL DATA: 88-year-old female with shortness of breath.

EXAM:
PORTABLE CHEST 1 VIEW

[chest ap]
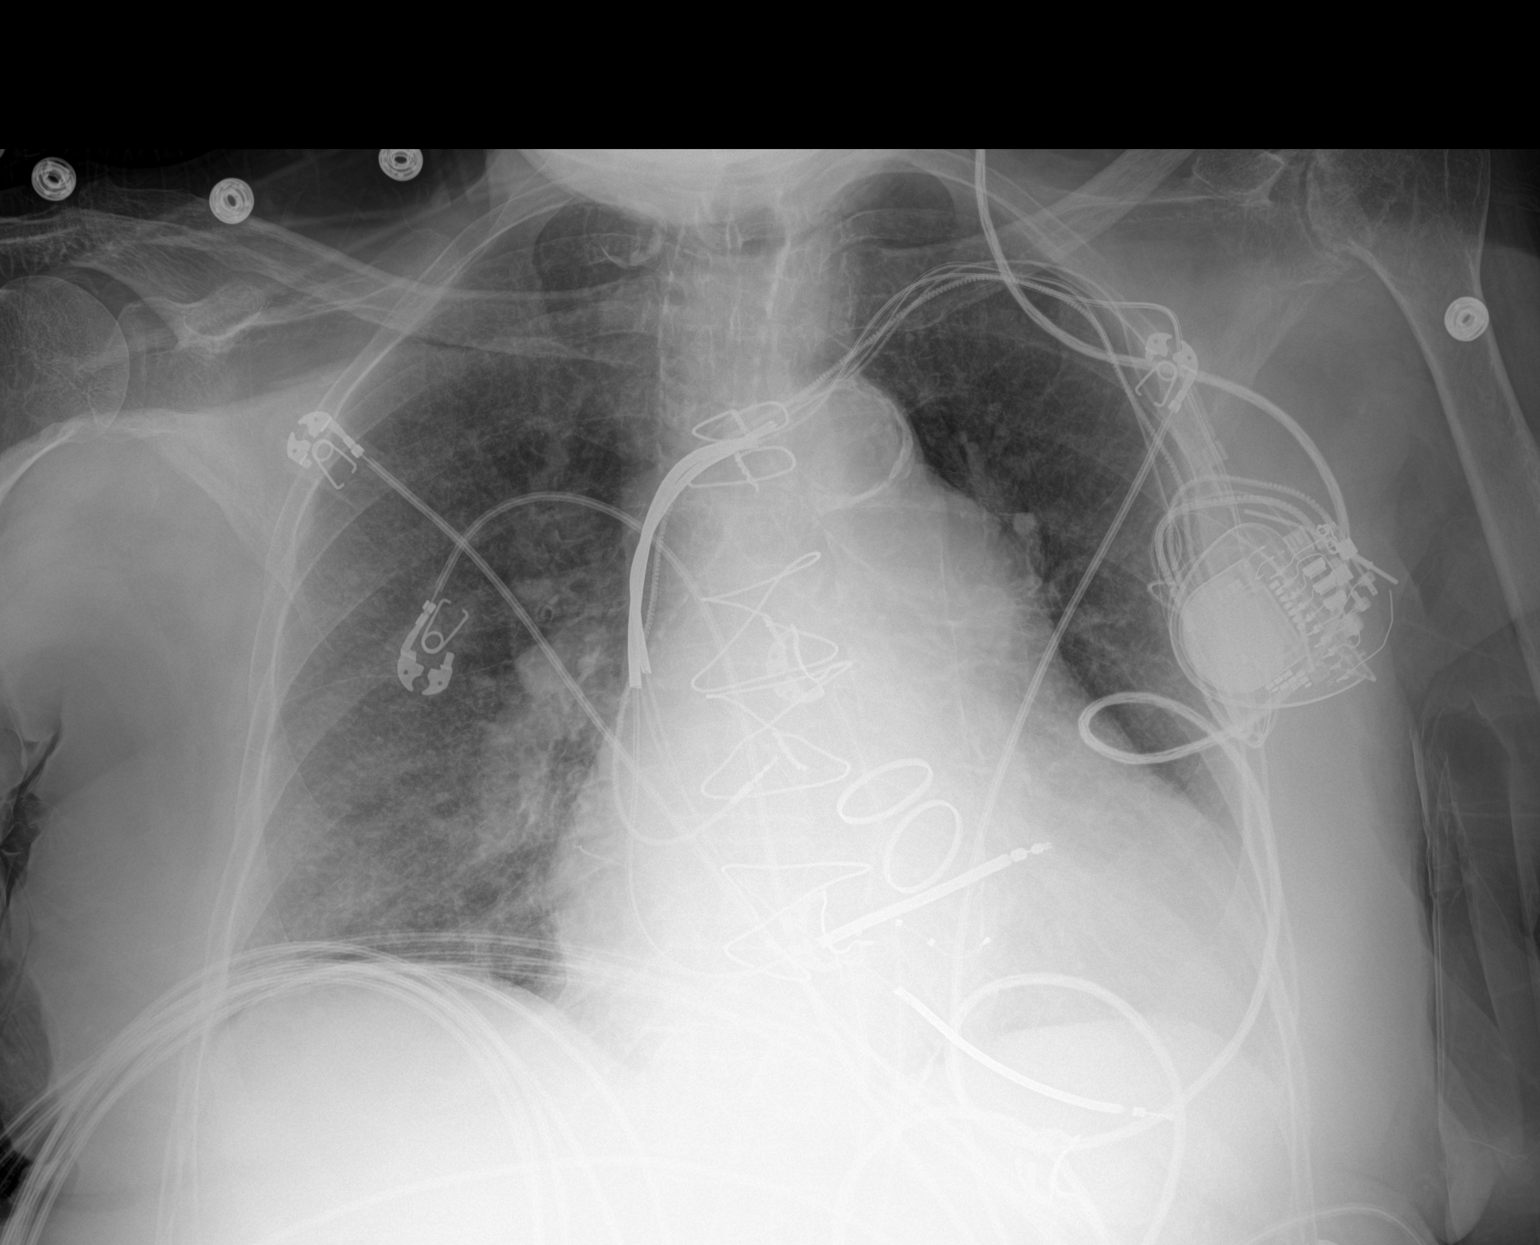

[1 of 1 positions shown; findings below may reference images not displayed]

FINDINGS: There is cardiomegaly with mild central vascular congestion. No
focal consolidation, pleural effusion, or pneumothorax. Left
pectoral AICD device. Atherosclerotic calcification of the aorta.
Median sternotomy wires. No acute osseous pathology.
IMPRESSION: Cardiomegaly with mild central vascular congestion. No focal
consolidation.

## 2021-11-26 IMAGING — US US RENAL
1 series · 14 of 25 positions shown · non-contrast
Comparison: Abdominal CT 06/26/2016.

CLINICAL DATA: Renal failure.

EXAM:
RENAL / URINARY TRACT ULTRASOUND COMPLETE

[Series 1: us renal · 14 of 28 slices shown]
[im 1/28]
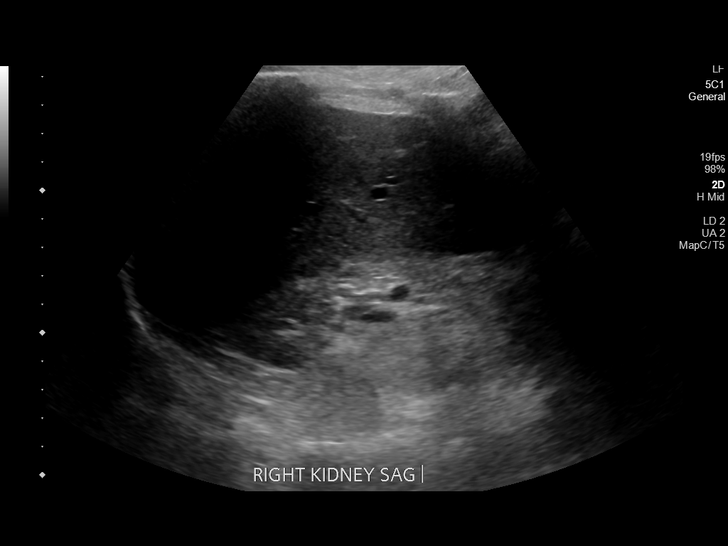
[im 3/28]
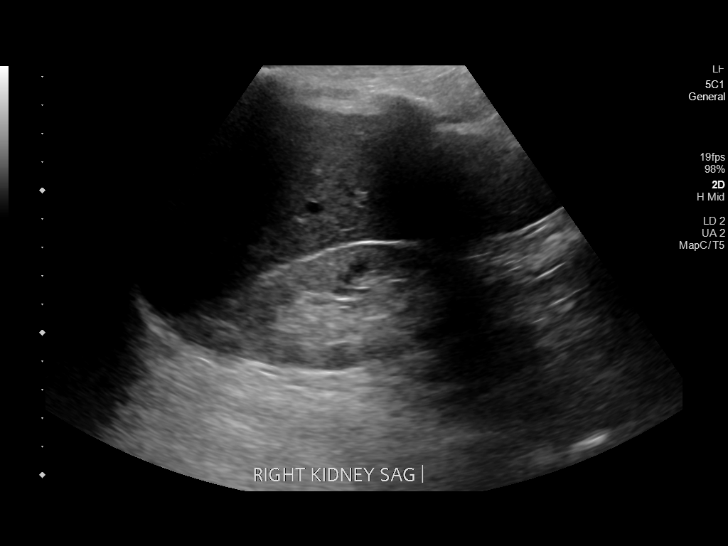
[im 5/28]
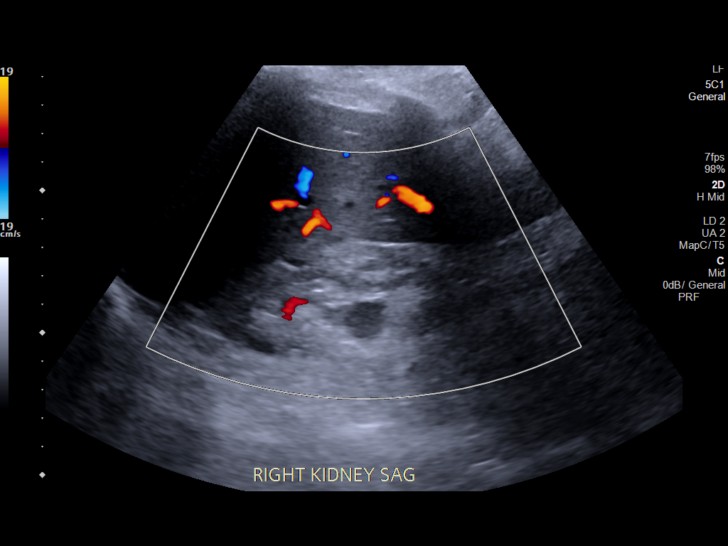
[im 7/28]
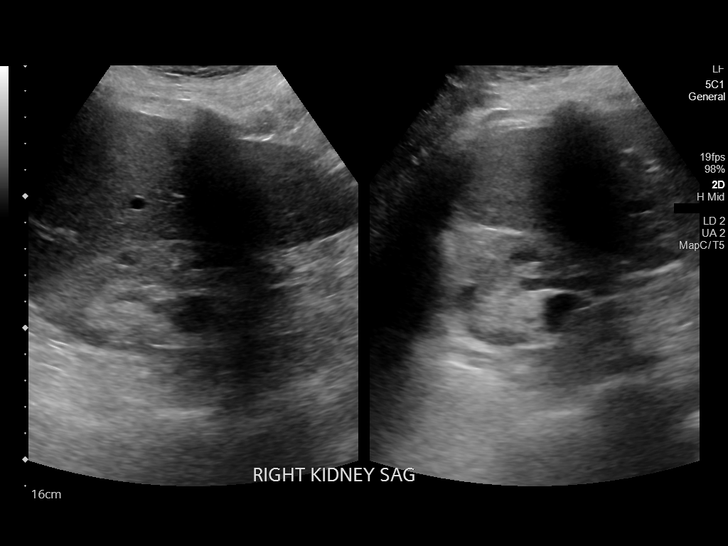
[im 10/28]
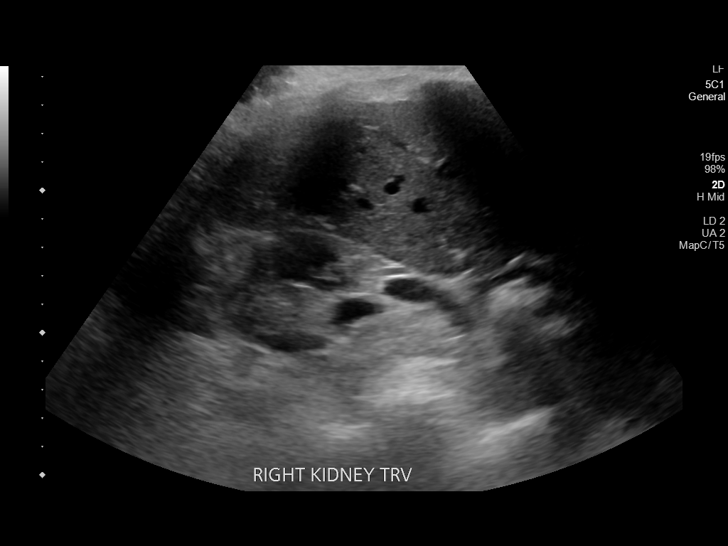
[im 11/28]
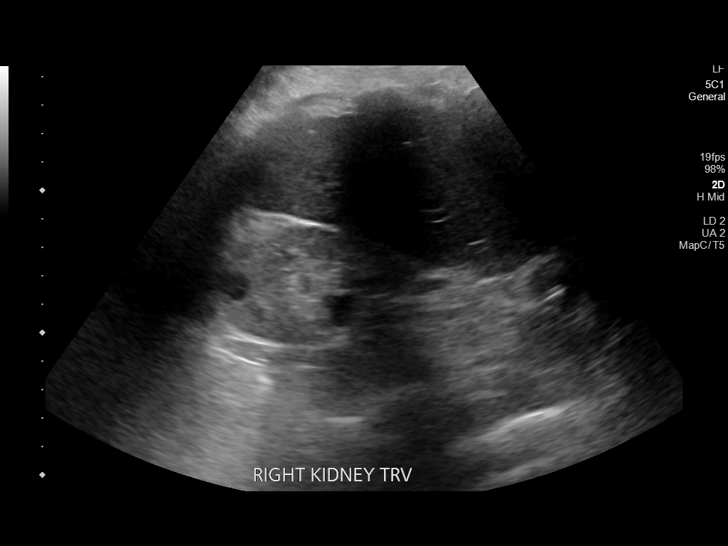
[im 13/28]
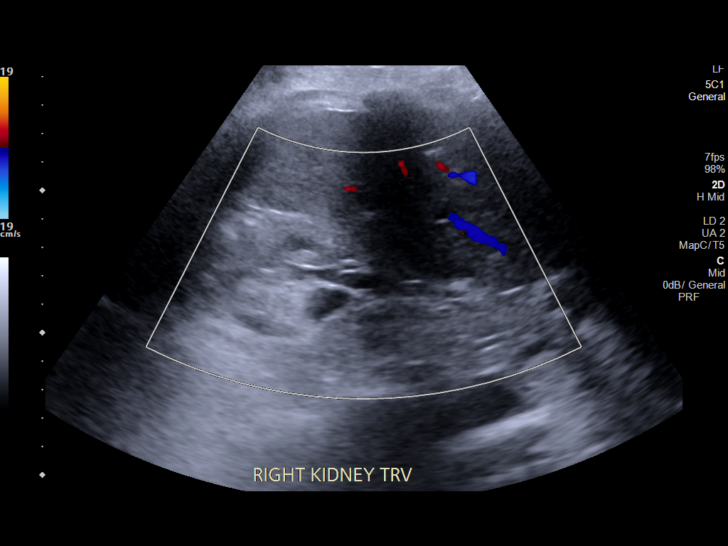
[im 15/28]
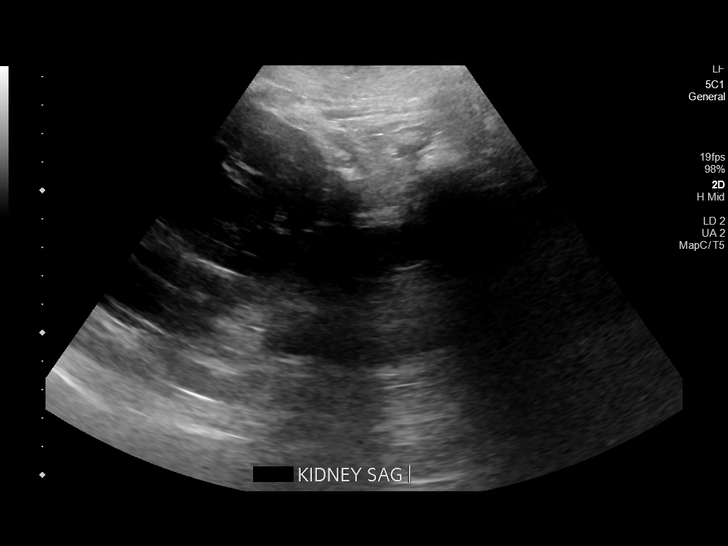
[im 17/28]
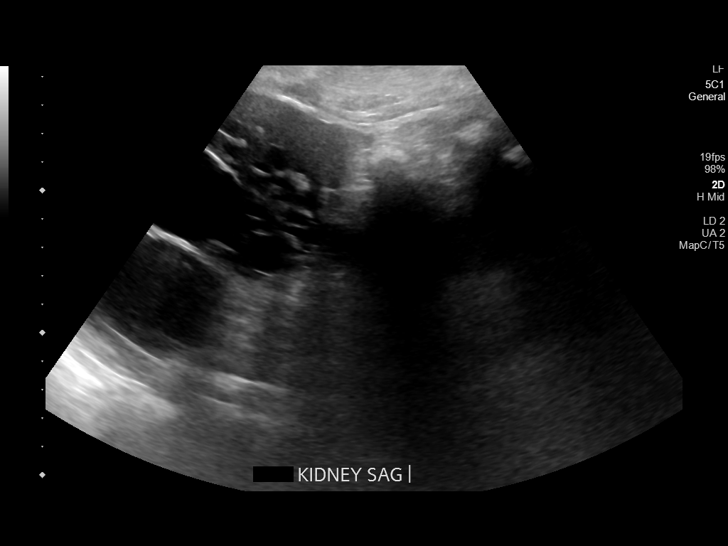
[im 19/28]
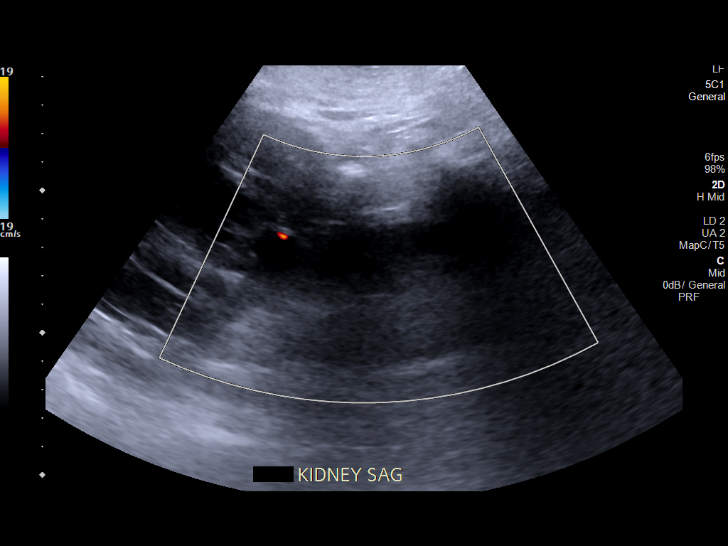
[im 21/28]
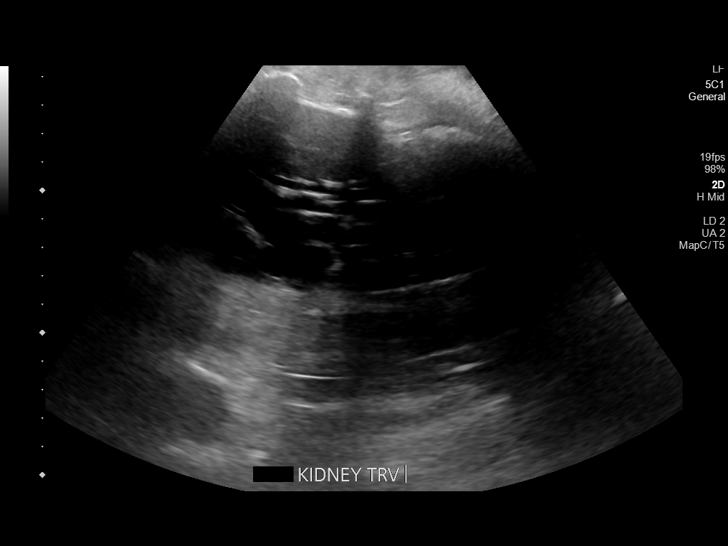
[im 23/28]
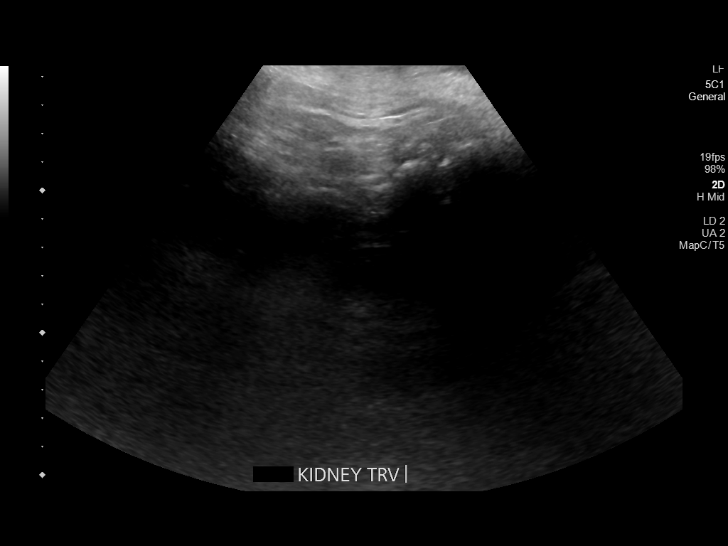
[im 25/28]
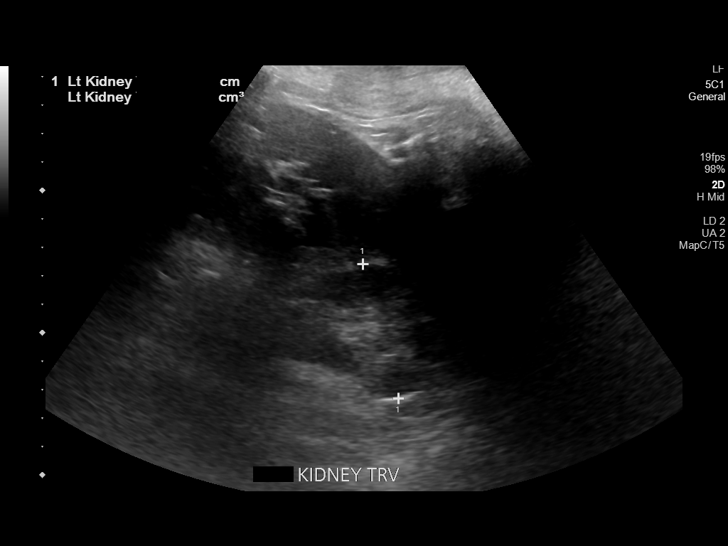
[im 28/28]
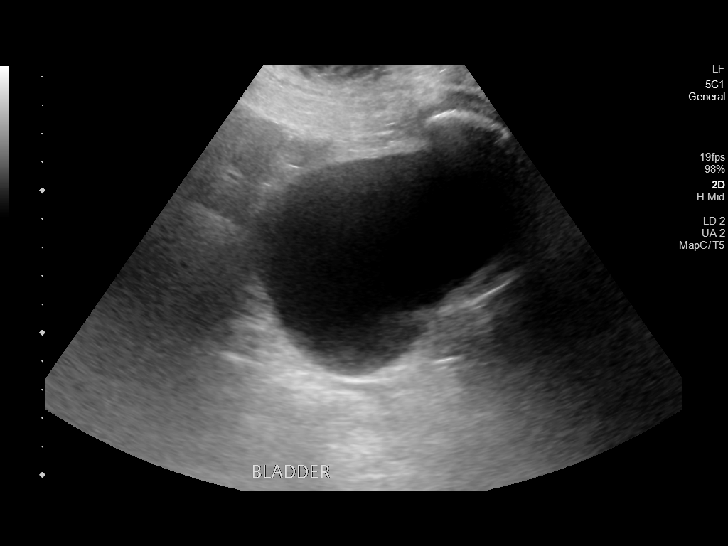

[14 of 25 positions shown; findings below may reference images not displayed]

FINDINGS: Right Kidney:

Renal measurements: 9.9 x 4.1 x 4.5 cm = volume: 95 mL. Mild
hydronephrosis. Renal parenchymal thinning with increased
parenchymal echogenicity. 1.1 cm cyst in the mid kidney.

Left Kidney:

Very limited evaluation due to shadowing bowel gas. Renal
measurements: 10.4 x 5.1 x 4.9 cm = volume: 136 mL. No gross
hydronephrosis. More detailed assessment is limited. Cyst on prior
CT is not seen.

Bladder:

Appears normal for degree of bladder distention. Neither ureteral
jet is seen.

Other:

None.
IMPRESSION: 1. Mild right hydronephrosis.
2. Right renal parenchymal thinning and increased echogenicity
consistent with chronic medical renal disease.
3. Significantly limited evaluation of the left kidney, no gross
hydronephrosis.

## 2021-11-28 IMAGING — DX DG CHEST 1V PORT
1 series · 1 of 1 positions shown · non-contrast
Comparison: Chest x-ray 01/18/2021.

CLINICAL DATA: Pulmonary edema.

EXAM:
PORTABLE CHEST 1 VIEW

[chest ap]
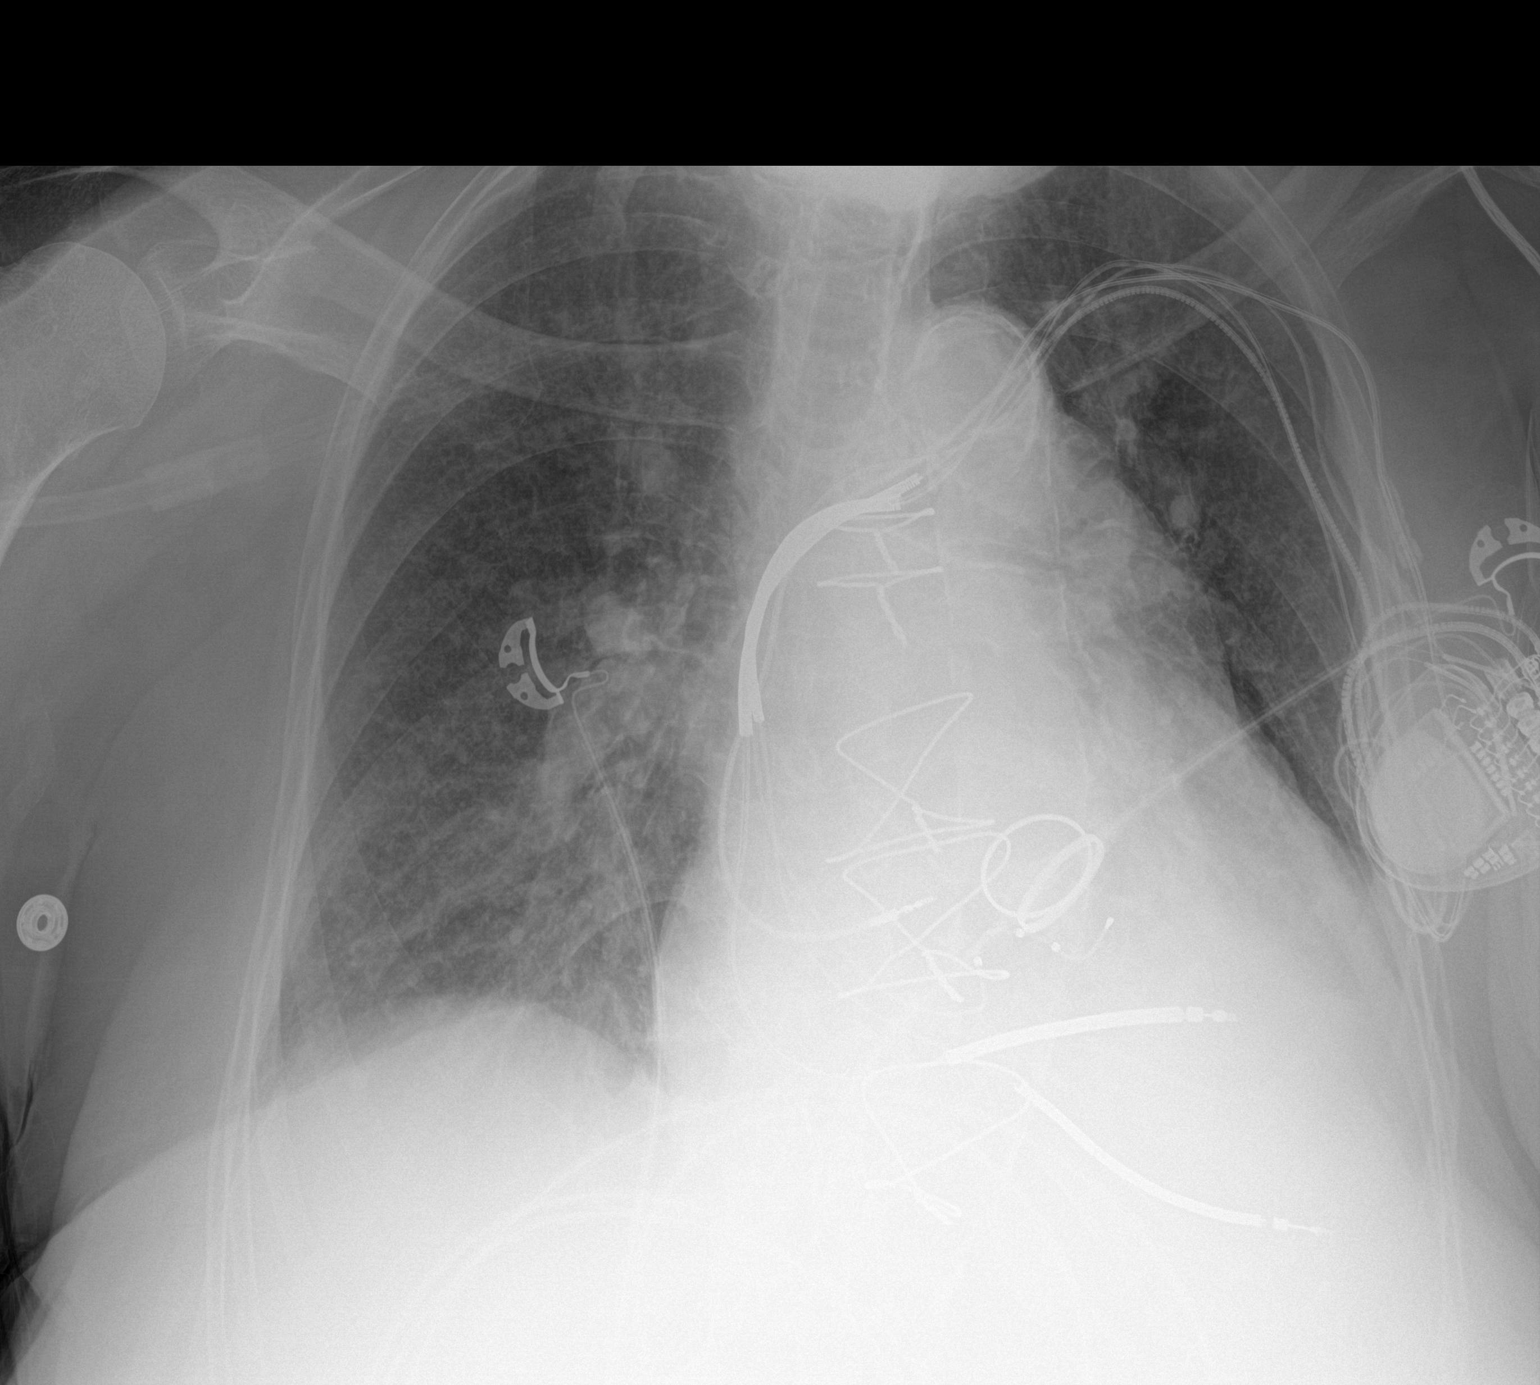

[1 of 1 positions shown; findings below may reference images not displayed]

FINDINGS: AICD in stable position. Prior cardiac valve replacement. Stable
prominent cardiomegaly. Mild bilateral interstitial prominence. Mild
interstitial edema and/or pneumonitis cannot be excluded.
Progressive bibasilar atelectasis. Small left pleural effusion
cannot be excluded. No pneumothorax.
IMPRESSION: 1. AICD in stable position. Prior cardiac valve replacement. Stable
prominent cardiomegaly. Mild bilateral interstitial prominence. Mild
interstitial edema and/or pneumonitis cannot be excluded.

2. Progressive bibasilar atelectasis. Small left pleural effusion
cannot be excluded.

## 2021-12-05 IMAGING — DX DG ELBOW 2V*L*
2 series · 2 of 2 positions shown · non-contrast
Comparison: None.

CLINICAL DATA: Soft tissue edema

EXAM:
LEFT ELBOW - 2 VIEW

[elbow ap]
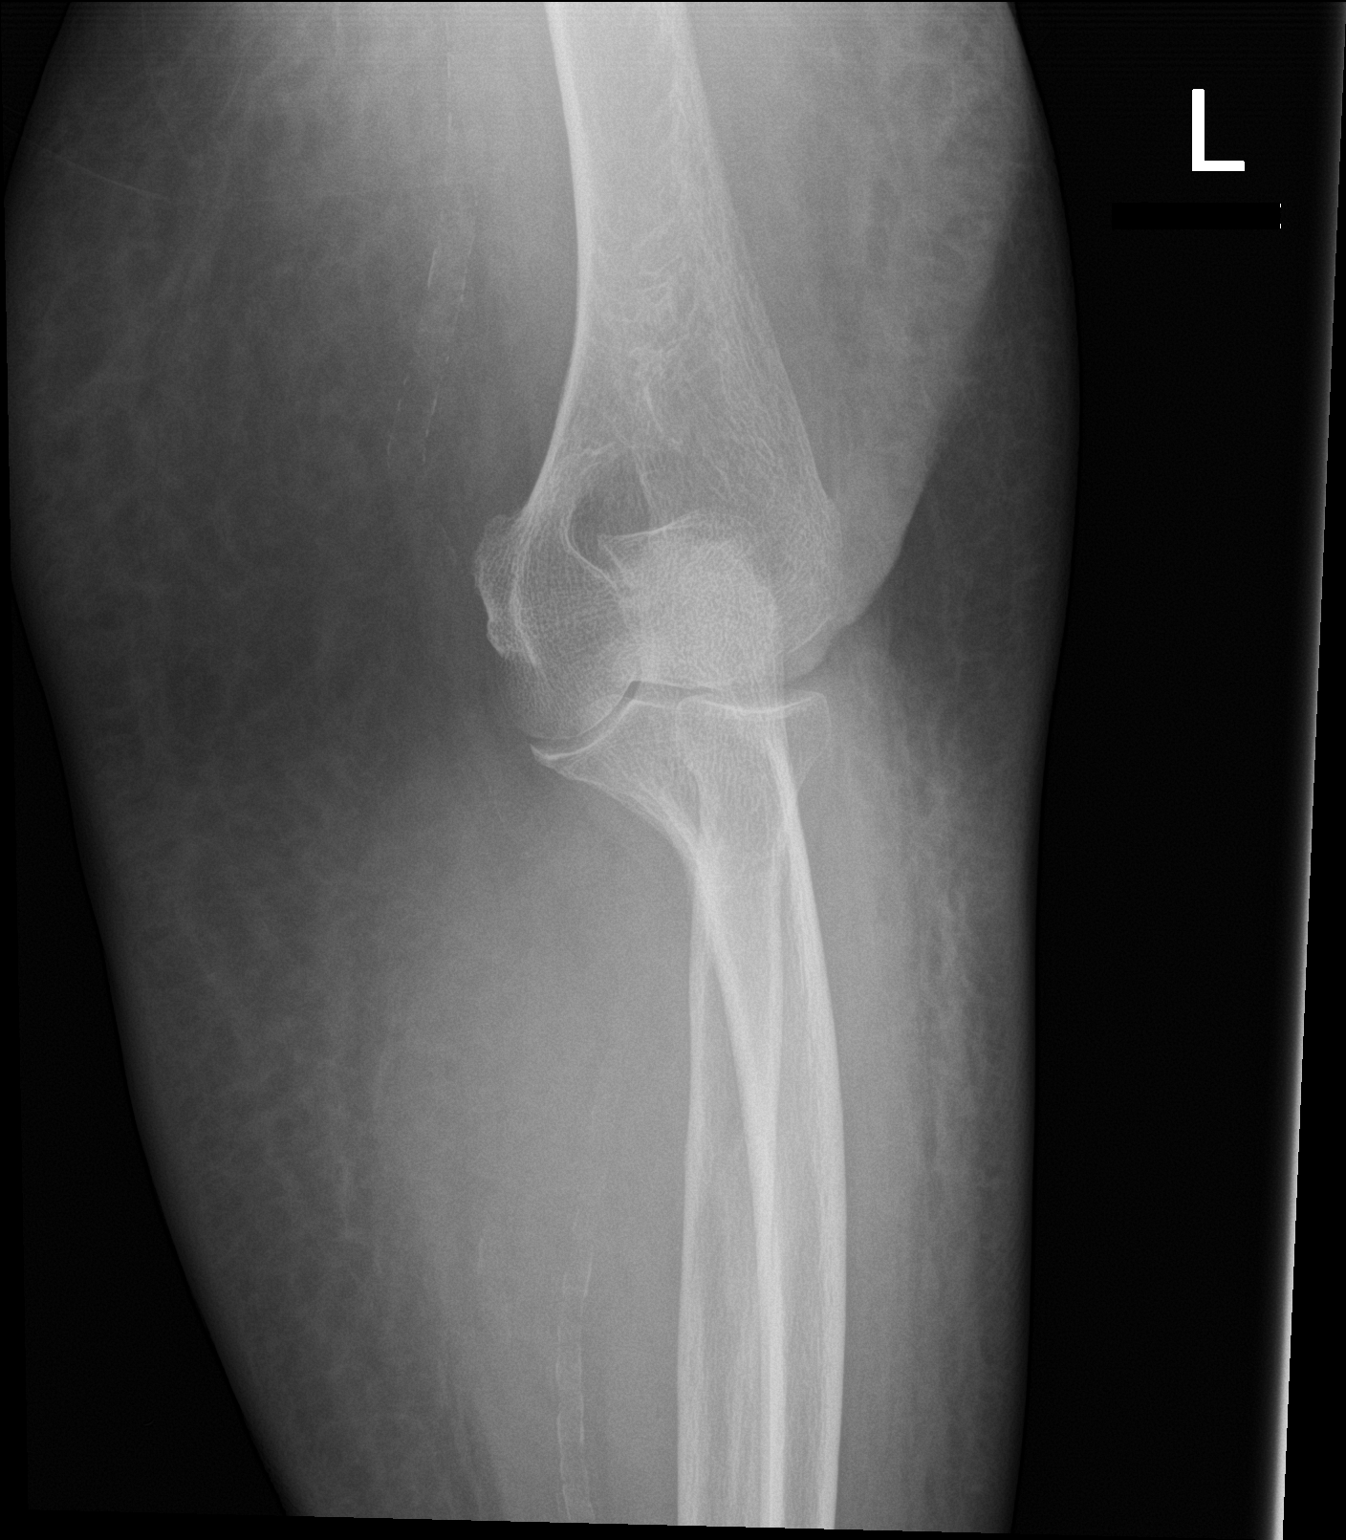

[elbow lat]
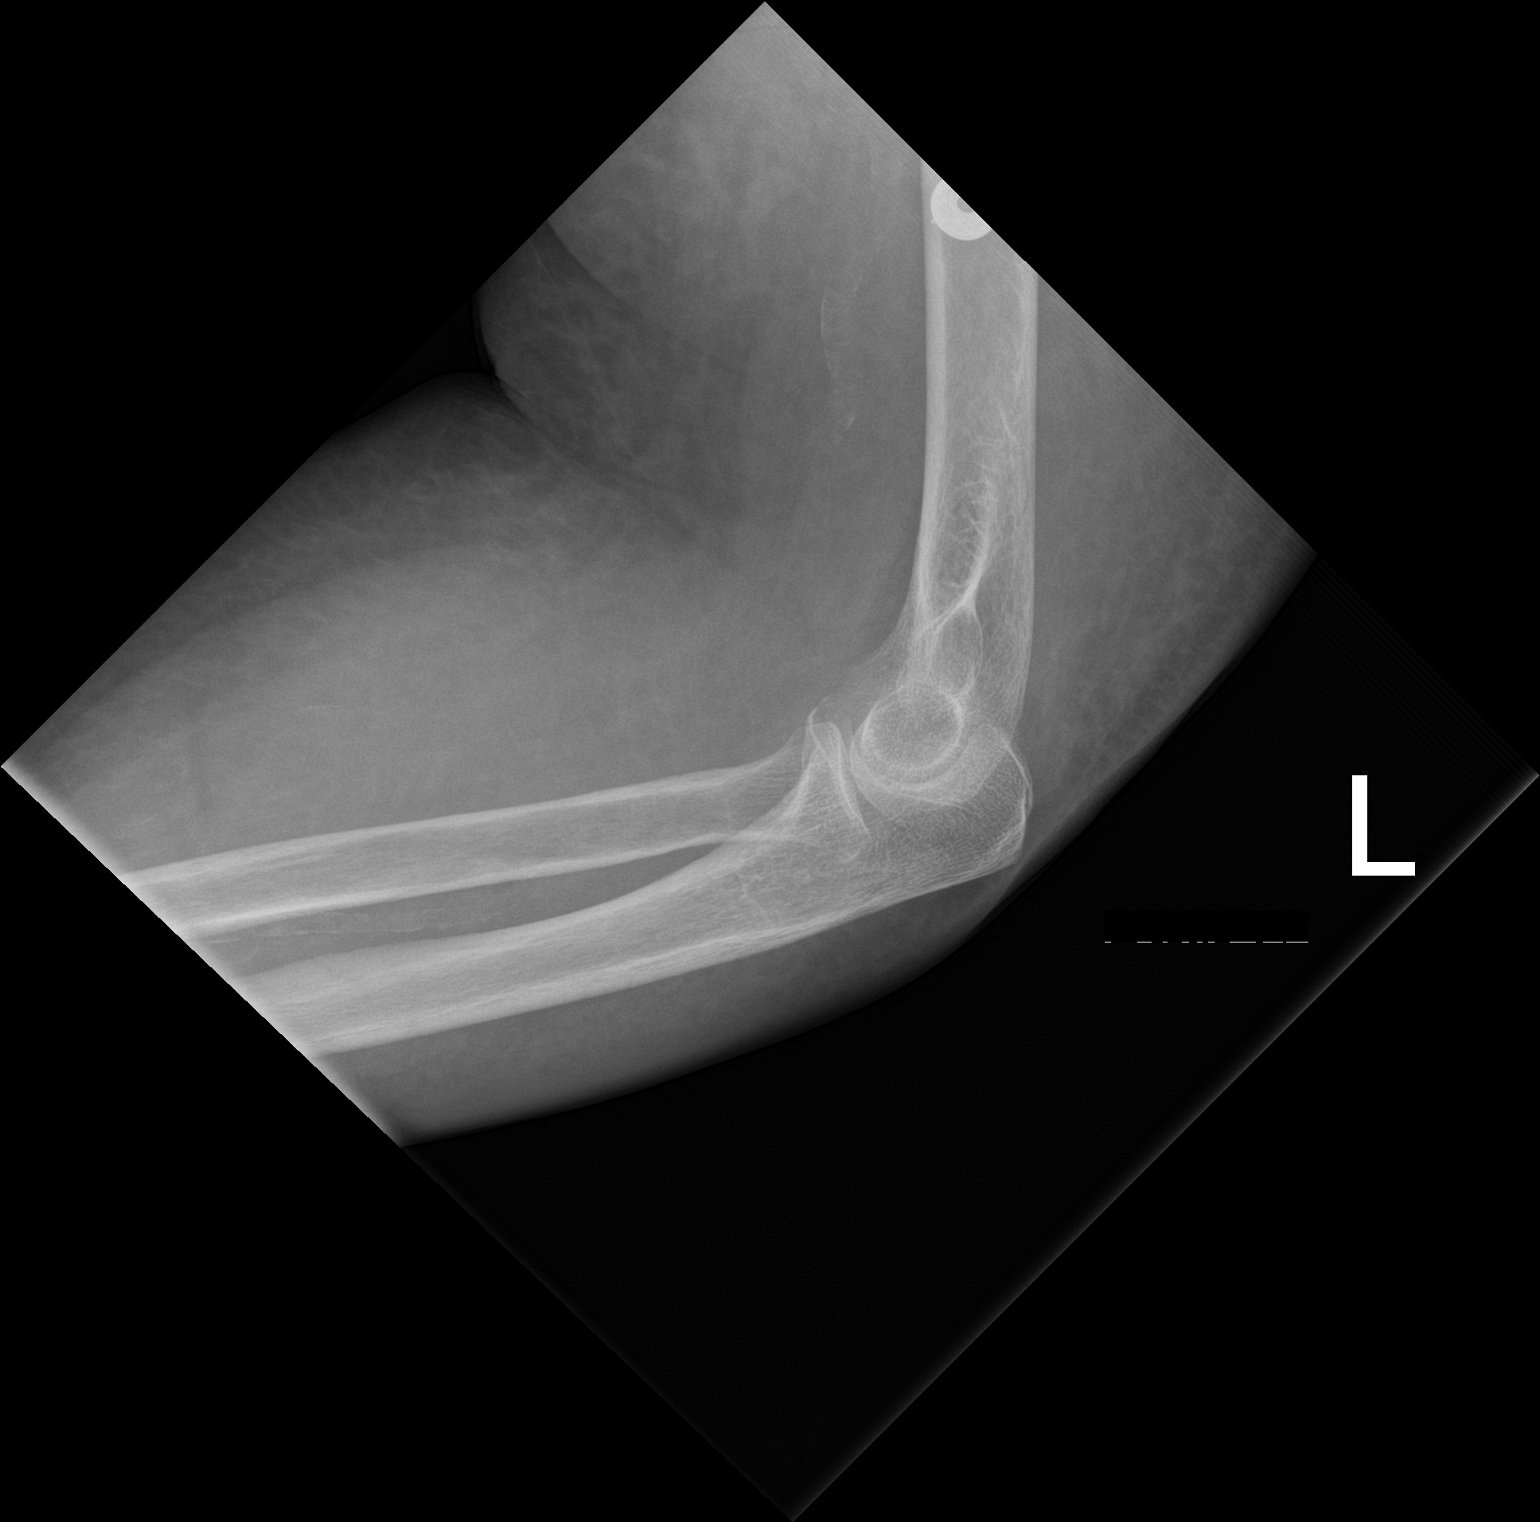

[2 of 2 positions shown; findings below may reference images not displayed]

FINDINGS: Frontal and lateral views obtained. There is soft tissue swelling.
No fracture or dislocation. No joint effusion. The joint spaces
appear normal. No erosive change. There are foci of arterial
vascular calcification.
IMPRESSION: Soft tissue swelling. No fracture or dislocation. No appreciable
arthropathy. Atherosclerotic arterial vascular calcifications noted.

## 2022-02-08 IMAGING — DX DG CHEST 1V PORT
1 series · 1 of 1 positions shown · non-contrast
Comparison: 01/20/2021

CLINICAL DATA: Cough and shortness of breath

EXAM:
PORTABLE CHEST 1 VIEW

[chest ap]
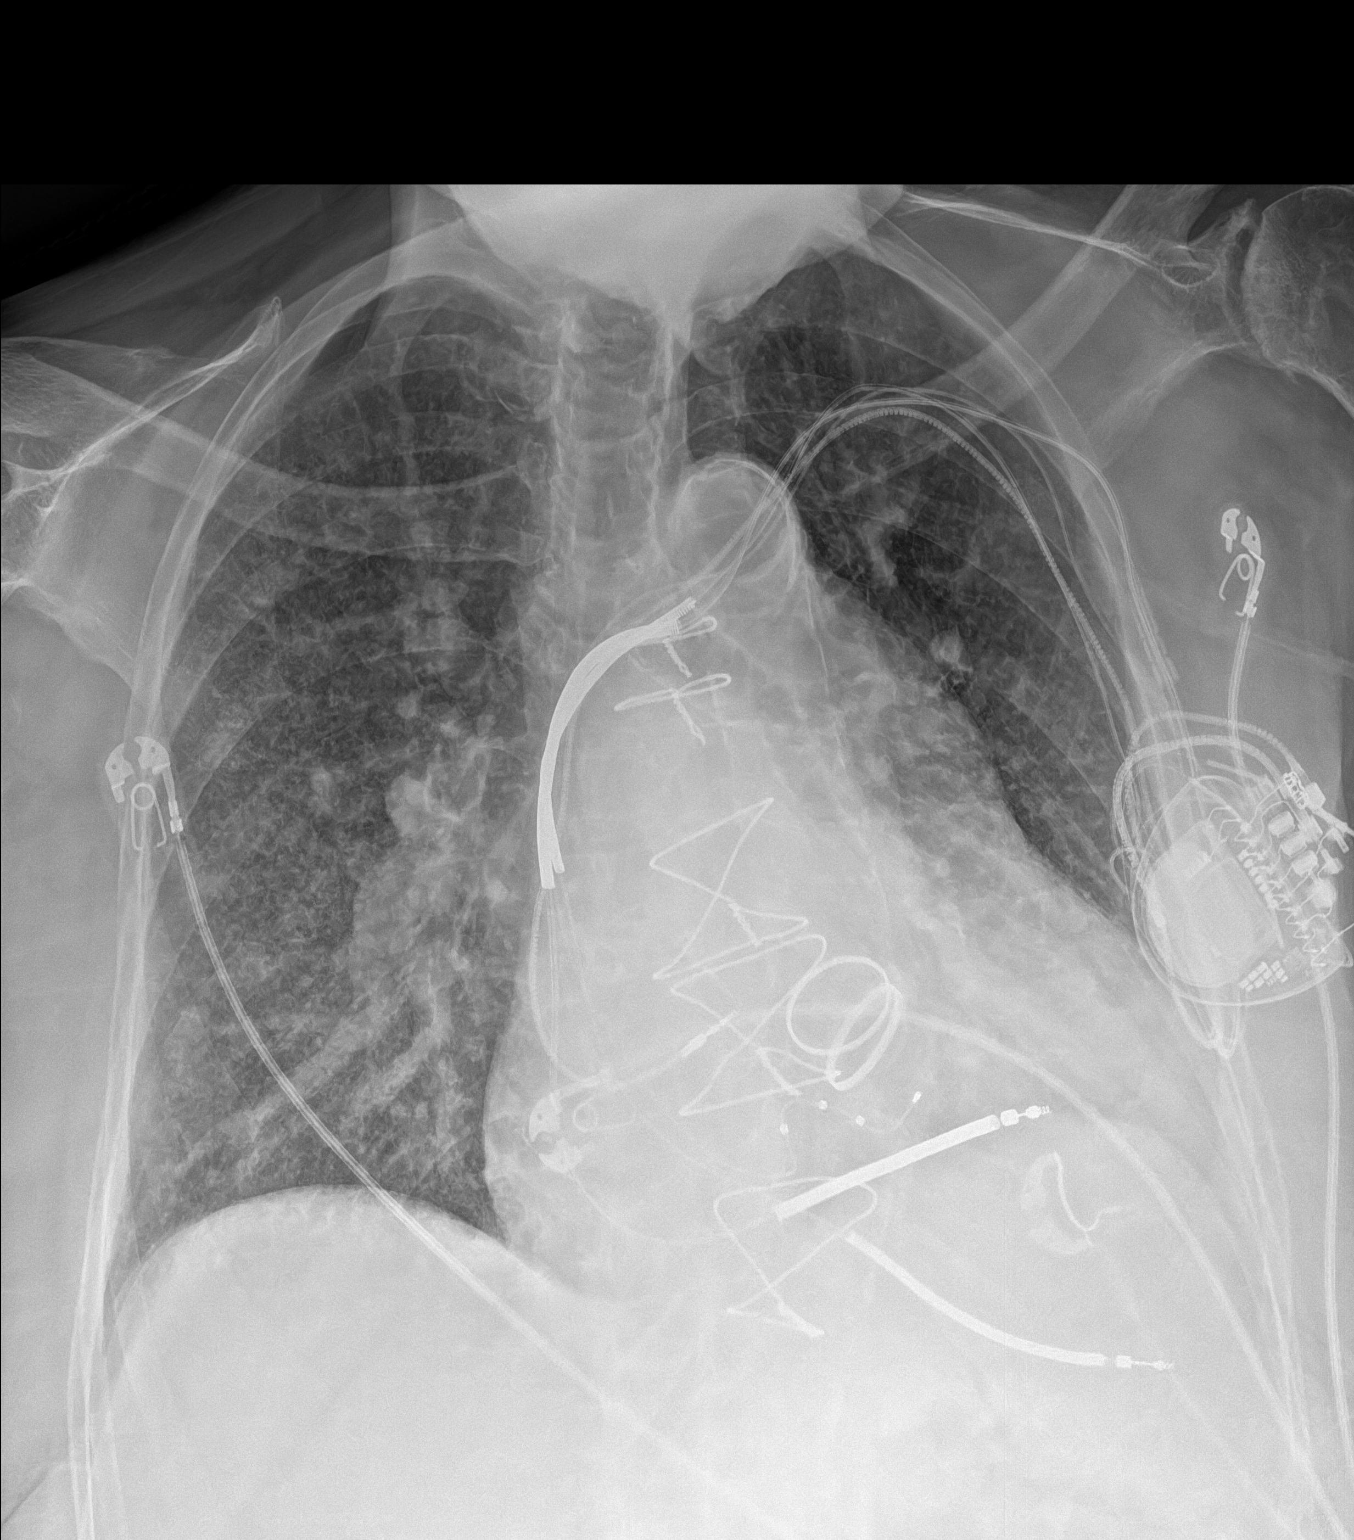

[1 of 1 positions shown; findings below may reference images not displayed]

FINDINGS: Cardiac shadow is enlarged but stable. Defibrillator and
postsurgical changes are again seen. Increased vascular congestion
is noted with mild interstitial edema. No focal infiltrate or
effusion is noted.
IMPRESSION: Changes of mild CHF.  No focal infiltrate is noted.

## 2022-04-25 ENCOUNTER — Ambulatory Visit: Payer: Self-pay | Admitting: *Deleted

## 2022-04-25 DIAGNOSIS — I5022 Chronic systolic (congestive) heart failure: Secondary | ICD-10-CM

## 2022-04-25 DIAGNOSIS — I10 Essential (primary) hypertension: Secondary | ICD-10-CM

## 2022-04-25 NOTE — Chronic Care Management (AMB) (Signed)
   04/25/2022  [name] [name] 1931-11-23 [id number]   Patient deceased June 25, 2022, care plan resolved.  Jacqlyn Larsen [facility], BSN RN Case Manager [facility] [phone]
